# Patient Record
Sex: Male | Born: 1945 | Race: Black or African American | Hispanic: No | Marital: Married | Smoking: Former smoker
Health system: Southern US, Community
[De-identification: ages and names within clinical notes are randomized; demographics above are authoritative.]

## PROBLEM LIST (undated history)

## (undated) DIAGNOSIS — R7303 Prediabetes: Secondary | ICD-10-CM

## (undated) DIAGNOSIS — R351 Nocturia: Secondary | ICD-10-CM

## (undated) DIAGNOSIS — N400 Enlarged prostate without lower urinary tract symptoms: Secondary | ICD-10-CM

## (undated) DIAGNOSIS — I10 Essential (primary) hypertension: Secondary | ICD-10-CM

## (undated) DIAGNOSIS — R339 Retention of urine, unspecified: Secondary | ICD-10-CM

## (undated) DIAGNOSIS — D649 Anemia, unspecified: Secondary | ICD-10-CM

## (undated) DIAGNOSIS — I2510 Atherosclerotic heart disease of native coronary artery without angina pectoris: Secondary | ICD-10-CM

## (undated) DIAGNOSIS — E119 Type 2 diabetes mellitus without complications: Secondary | ICD-10-CM

## (undated) DIAGNOSIS — N189 Chronic kidney disease, unspecified: Secondary | ICD-10-CM

## (undated) DIAGNOSIS — I509 Heart failure, unspecified: Secondary | ICD-10-CM

## (undated) DIAGNOSIS — K449 Diaphragmatic hernia without obstruction or gangrene: Secondary | ICD-10-CM

## (undated) DIAGNOSIS — M7989 Other specified soft tissue disorders: Secondary | ICD-10-CM

## (undated) DIAGNOSIS — Z955 Presence of coronary angioplasty implant and graft: Secondary | ICD-10-CM

## (undated) DIAGNOSIS — R1310 Dysphagia, unspecified: Secondary | ICD-10-CM

## (undated) DIAGNOSIS — E785 Hyperlipidemia, unspecified: Secondary | ICD-10-CM

## (undated) DIAGNOSIS — R0602 Shortness of breath: Secondary | ICD-10-CM

## (undated) DIAGNOSIS — E1142 Type 2 diabetes mellitus with diabetic polyneuropathy: Secondary | ICD-10-CM

## (undated) DIAGNOSIS — J189 Pneumonia, unspecified organism: Secondary | ICD-10-CM

## (undated) DIAGNOSIS — I255 Ischemic cardiomyopathy: Secondary | ICD-10-CM

## (undated) DIAGNOSIS — H04123 Dry eye syndrome of bilateral lacrimal glands: Secondary | ICD-10-CM

## (undated) HISTORY — DX: Type 2 diabetes mellitus without complications: E11.9

## (undated) HISTORY — PX: OTHER SURGICAL HISTORY: SHX169

## (undated) HISTORY — DX: Hyperlipidemia, unspecified: E78.5

## (undated) HISTORY — DX: Type 2 diabetes mellitus with diabetic polyneuropathy: E11.42

## (undated) HISTORY — DX: Other specified soft tissue disorders: M79.89

## (undated) HISTORY — PX: CARDIAC CATHETERIZATION: SHX172

## (undated) HISTORY — PX: UPPER GASTROINTESTINAL ENDOSCOPY: SHX188

## (undated) HISTORY — DX: Ischemic cardiomyopathy: I25.5

## (undated) HISTORY — DX: Presence of coronary angioplasty implant and graft: Z95.5

## (undated) HISTORY — DX: Chronic kidney disease, unspecified: N18.9

---

## 2000-08-18 ENCOUNTER — Encounter: Admission: RE | Admit: 2000-08-18 | Discharge: 2000-08-18 | Payer: Self-pay | Admitting: Internal Medicine

## 2000-08-18 ENCOUNTER — Encounter: Payer: Self-pay | Admitting: Internal Medicine

## 2000-08-19 ENCOUNTER — Ambulatory Visit (HOSPITAL_COMMUNITY): Admission: RE | Admit: 2000-08-19 | Discharge: 2000-08-19 | Payer: Self-pay | Admitting: Internal Medicine

## 2004-01-14 ENCOUNTER — Encounter (INDEPENDENT_AMBULATORY_CARE_PROVIDER_SITE_OTHER): Payer: Self-pay | Admitting: Specialist

## 2004-01-14 ENCOUNTER — Ambulatory Visit (HOSPITAL_BASED_OUTPATIENT_CLINIC_OR_DEPARTMENT_OTHER): Admission: RE | Admit: 2004-01-14 | Discharge: 2004-01-14 | Payer: Self-pay | Admitting: Urology

## 2004-01-14 ENCOUNTER — Ambulatory Visit (HOSPITAL_COMMUNITY): Admission: RE | Admit: 2004-01-14 | Discharge: 2004-01-14 | Payer: Self-pay | Admitting: Urology

## 2006-09-28 ENCOUNTER — Encounter: Admission: RE | Admit: 2006-09-28 | Discharge: 2006-09-28 | Payer: Self-pay | Admitting: Internal Medicine

## 2006-12-23 ENCOUNTER — Ambulatory Visit (HOSPITAL_COMMUNITY): Admission: RE | Admit: 2006-12-23 | Discharge: 2006-12-23 | Payer: Self-pay | Admitting: Gastroenterology

## 2009-11-12 DIAGNOSIS — R0602 Shortness of breath: Secondary | ICD-10-CM | POA: Insufficient documentation

## 2009-11-12 DIAGNOSIS — I10 Essential (primary) hypertension: Secondary | ICD-10-CM | POA: Insufficient documentation

## 2009-11-12 DIAGNOSIS — E1169 Type 2 diabetes mellitus with other specified complication: Secondary | ICD-10-CM

## 2009-11-12 DIAGNOSIS — G35 Multiple sclerosis: Secondary | ICD-10-CM | POA: Insufficient documentation

## 2009-11-12 DIAGNOSIS — E785 Hyperlipidemia, unspecified: Secondary | ICD-10-CM | POA: Insufficient documentation

## 2009-11-12 HISTORY — DX: Essential (primary) hypertension: I10

## 2009-11-12 HISTORY — DX: Shortness of breath: R06.02

## 2009-11-12 HISTORY — DX: Type 2 diabetes mellitus with other specified complication: E11.69

## 2009-11-13 ENCOUNTER — Ambulatory Visit: Payer: Self-pay | Admitting: Pulmonary Disease

## 2009-12-02 ENCOUNTER — Ambulatory Visit: Payer: Self-pay | Admitting: Pulmonary Disease

## 2010-05-27 NOTE — Miscellaneous (Signed)
Summary: Orders Update-pft charges//jwr  Clinical Lists Changes  Orders: Added new Service order of Carbon Monoxide diffusing w/capacity (94720) - Signed Added new Service order of Lung Volumes (94240) - Signed Added new Service order of Spirometry (Pre & Post) (94060) - Signed 

## 2010-05-27 NOTE — Assessment & Plan Note (Signed)
Summary: consult for dyspnea   Copy to:  Willey Blade Primary Provider/Referring Provider:  Willey Blade  CC:  Pulmonary Consult.  History of Present Illness: The pt is a 65y.o. male who I have been asked to see for doe.  The pt states that his family has been commenting on abnormal breathing for a few years, although he feels that he can walk long distances at a moderate pace.  He states that he may have an occasional "bad day" where he gets winded easily.  He has no issues bringing groceries in from the car, and does not have problems going up 1-2 flight of stairs.  He denies any signficant cough or mucus production.  He does have a h/o MS, but denies any significant muscle weakness.  He does have a h/o smoking,  but has not had recent pfts or cxr.  He denies any h/o cardiac disease.  His weight has increased 10-15 pounds over the last 2 years.  Preventive Screening-Counseling & Management  Alcohol-Tobacco     Smoking Status: quit  Current Medications (verified): 1)  Vitamin D 5000 .... Take 1 Tablet By Mouth Two Times A Day 2)  Vitamin D 1000 Unit Tabs (Cholecalciferol) .... Take 1 Tablet By Mouth Two Times A Day 3)  Calcium 500 + Vitamin D .... Take 1 Tablet By Mouth Three Times A Day 4)  Multivitamins  Tabs (Multiple Vitamin) .... Take 1 Tablet By Mouth Once A Day 5)  Fish Oil 1000 Mg Caps (Omega-3 Fatty Acids) .... Take 1 Tablet By Mouth Once A Day 6)  Vitamin C 1000 Mg Tabs (Ascorbic Acid) .... Take 1 Tablet By Mouth Once A Day 7)  Vitamin B-12 2500 Mcg Subl (Cyanocobalamin) .... Take 1 Tablet By Mouth Once A Day 8)  Garlic Oil 123XX123 Mg Caps (Garlic) .... Take 1 Tablet By Mouth Once A Day 9)  Diovan Hct 160-25 Mg Tabs (Valsartan-Hydrochlorothiazide) .... Take 1 Tablet By Mouth Once A Day 10)  Propranolol Hcl 10 Mg Tabs (Propranolol Hcl) .... Take 1 Tablet By Mouth Two Times A Day  Allergies (verified): No Known Drug Allergies  Past History:  Past Medical  History:  HYPERLIPIDEMIA (ICD-272.4) MULTIPLE SCLEROSIS (ICD-340) HYPERTENSION (ICD-401.9)  Past Surgical History: testicular surgery approx 1990s  Family History: Reviewed history and no changes required. allergies: brothers x 3  clotting disorders: mother (stroke) rheumastim: mother cancer: father (prostate) mother: DM father: DM  Social History: Reviewed history and no changes required. Patient states former smoker.  started at age 56.  56 1/2 ppd.  quit 1996 pt is married. pt has children. pt is retired from [employer].   Smoking Status:  quit  Review of Systems       The patient complains of shortness of breath with activity, shortness of breath at rest, tooth/dental problems, headaches, nasal congestion/difficulty breathing through nose, sneezing, itching, and hand/feet swelling.  The patient denies productive cough, non-productive cough, coughing up blood, chest pain, irregular heartbeats, acid heartburn, indigestion, loss of appetite, weight change, abdominal pain, difficulty swallowing, sore throat, ear ache, anxiety, depression, joint stiffness or pain, rash, change in color of mucus, and fever.    Vital Signs:  Patient profile:   65 year old male Height:      69 inches Weight:      194.13 pounds BMI:     28.77 O2 Sat:      95 % on Room air Temp:     98.0 degrees F oral Pulse rate:  91 / minute BP sitting:   152 / 92  (left arm) Cuff size:   regular  Vitals Entered By: Matthew Folks LPN (July 20, 624THL 075-GRM AM)  O2 Flow:  Room air CC: Pulmonary Consult Comments Medications reviewed with patient Matthew Folks LPN  July 20, 624THL 075-GRM AM    Physical Exam  General:  wd male in nad Eyes:  PERRLA and EOMI.   Nose:  mild edema and erythema, mild narrowing but patent. Mouth:  clear  Neck:  no jvd, tmg, LN Lungs:  totally clear to auscultation, no wheezing or rhonchi Heart:  rrr, 2/6 sem Abdomen:  soft and nontender, bs+ Extremities:  mild LLE  edema, right with no edema pulses intact distally, no cyanosis Neurologic:  alert and oriented, moves all 4.   Impression & Recommendations:  Problem # 1:  SHORTNESS OF BREATH (ICD-786.05) the pt has doe primarily with moderate to heavier exertional activities, and intermittant in nature.  He has a long h/o smoking in the past, and may have some degree of obstructive lung disease.  He also has gained weight over the last 2 years, and does not get consistent exercise.  Would also consider whether he may have issues with thyroid disease, anemia, or cardiac disease.  Will check cxr today, and schedule for full pfts with f/u with me on same day to discuss.  Medications Added to Medication List This Visit: 1)  Vitamin D 5000  .... Take 1 tablet by mouth two times a day 2)  Vitamin D 1000 Unit Tabs (Cholecalciferol) .... Take 1 tablet by mouth two times a day 3)  Calcium 500 + Vitamin D  .... Take 1 tablet by mouth three times a day 4)  Multivitamins Tabs (Multiple vitamin) .... Take 1 tablet by mouth once a day 5)  Fish Oil 1000 Mg Caps (Omega-3 fatty acids) .... Take 1 tablet by mouth once a day 6)  Vitamin C 1000 Mg Tabs (Ascorbic acid) .... Take 1 tablet by mouth once a day 7)  Vitamin B-12 2500 Mcg Subl (Cyanocobalamin) .... Take 1 tablet by mouth once a day 8)  Garlic Oil 123XX123 Mg Caps (Garlic) .... Take 1 tablet by mouth once a day 9)  Diovan Hct 160-25 Mg Tabs (Valsartan-hydrochlorothiazide) .... Take 1 tablet by mouth once a day 10)  Propranolol Hcl 10 Mg Tabs (Propranolol hcl) .... Take 1 tablet by mouth two times a day  Other Orders: Consultation Level IV [id number]) Pulmonary Referral (Pulmonary) T-2 View CXR ([id number])  Patient Instructions: 1)  will check cxr today, and will call with results. 2)  will set up for breathing studies, and see you back on same day to review. 3)  work on weight loss and conditioning program.

## 2010-05-27 NOTE — Assessment & Plan Note (Signed)
Summary: rov for dyspnea, discuss pft results.   Copy to:  Willey Blade Primary Provider/Referring Provider:  Willey Blade  CC:  Pt is here for a f/u appt to discuss PFT results.  Pt still c/o sob with exertion and at rest. .  History of Present Illness: The pt comes in today for f/u after his pfts for dyspnea.  He was found to have normal spirometry, and only very mild reduction in TLC and DLCO that is most c/w his centripetal obesity (his cxr shows no ISLD).  I have reviewed the study with him in great detail and answered all of his questions.  Medications Prior to Update: 1)  Vitamin D 5000 .... Take 1 Tablet By Mouth Two Times A Day 2)  Vitamin D 1000 Unit Tabs (Cholecalciferol) .... Take 1 Tablet By Mouth Two Times A Day 3)  Calcium 500 + Vitamin D .... Take 1 Tablet By Mouth Three Times A Day 4)  Multivitamins  Tabs (Multiple Vitamin) .... Take 1 Tablet By Mouth Once A Day 5)  Fish Oil 1000 Mg Caps (Omega-3 Fatty Acids) .... Take 1 Tablet By Mouth Once A Day 6)  Vitamin C 1000 Mg Tabs (Ascorbic Acid) .... Take 1 Tablet By Mouth Once A Day 7)  Vitamin B-12 2500 Mcg Subl (Cyanocobalamin) .... Take 1 Tablet By Mouth Once A Day 8)  Garlic Oil 123XX123 Mg Caps (Garlic) .... Take 1 Tablet By Mouth Once A Day 9)  Diovan Hct 160-25 Mg Tabs (Valsartan-Hydrochlorothiazide) .... Take 1 Tablet By Mouth Once A Day 10)  Propranolol Hcl 10 Mg Tabs (Propranolol Hcl) .... Take 1 Tablet By Mouth Two Times A Day  Allergies (verified): No Known Drug Allergies  Review of Systems       The patient complains of shortness of breath with activity, shortness of breath at rest, tooth/dental problems, nasal congestion/difficulty breathing through nose, itching, and hand/feet swelling.  The patient denies productive cough, non-productive cough, coughing up blood, chest pain, irregular heartbeats, acid heartburn, indigestion, loss of appetite, weight change, abdominal pain, difficulty swallowing, sore  throat, headaches, sneezing, ear ache, anxiety, depression, joint stiffness or pain, rash, change in color of mucus, and fever.    Vital Signs:  Patient profile:   65 year old male Height:      69 inches Weight:      195 pounds BMI:     28.90 O2 Sat:      93 % on Room air Temp:     97.5 degrees F oral Pulse rate:   93 / minute BP sitting:   156 / 92  (right arm) Cuff size:   regular  Vitals Entered By: Matthew Folks LPN (August  8, 624THL 2:23 PM)  O2 Flow:  Room air CC: Pt is here for a f/u appt to discuss PFT results.  Pt still c/o sob with exertion and at rest.  Comments Medications reviewed with patient Matthew Folks LPN  August  8, 624THL 2:26 PM    Physical Exam  General:  ow male in nad Lungs:  clear Extremities:  mild LLE edema, no cyanosis Neurologic:  alert and oriented, moves all 4.   Impression & Recommendations:  Problem # 1:  SHORTNESS OF BREATH (ICD-786.05)  the pt has a clear cxr, no obstructive disease on spirometry, and only a very mild decrease in TLC and DLCO.  He has moderate centripetal obesity that I suspect is contributing to this, and no evidence for ISLD on his cxr.  At this point, I see no obvious pulmonary etiology for his moderate doe.  I have recommended to him that he work on weight loss and some type of conditioning program.  Will also send a note to his primary md to consider cardiac evaluation for other causes of doe.     Other Orders: Est. Patient Level III [id number])  Patient Instructions: 1)  work on weight loss and exercise program. 2)  followup with me as needed.

## 2010-09-12 NOTE — Op Note (Signed)
NAME:  MIQUEAS, RACKERS                         ACCOUNT NO.:  [id number]   MEDICAL RECORD NO.:  [id number]                   PATIENT TYPE:  AMB   LOCATION:  [facility]                                 FACILITY:  [facility]   PHYSICIAN:  Sigmund I. Gaynelle Arabian, M.D.         DATE OF BIRTH:  04/04/1946   DATE OF PROCEDURE:  01/14/2004  DATE OF DISCHARGE:                                 OPERATIVE REPORT   PREOPERATIVE DIAGNOSIS:  Gross hematuria with positive NNP-22.   POSTOPERATIVE DIAGNOSIS:  Gross hematuria with positive NNP-22.   OPERATION:  Cystourethroscopy, random bladder biopsies with cauterization of  the biopsy sites.   SURGEON:  Sigmund I. Gaynelle Arabian, M.D.   ANESTHESIA:  General LMA.   PREPARATION:  After appropriate pre-anesthesia, the patient was brought to  the operating room and placed on the operating table in the dorsal supine  position where general LMA anesthesia was introduced.  He was then replaced  in the dorsal lithotomy position where the pubis was prepped with Betadine  solution and draped in the usual fashion.   PROCEDURE:  Cystourethroscopy was accomplished and showed an angry, reddened  trigone.  Two biopsies were taken from the trigone area and biopsy sites  were cauterized.  No other abnormalities were identified in the bladder.  The bladder was drained of fluid and the patient was awakened and taken to  the recovery room in good condition.      SIT/MEDQ  D:  01/14/2004  T:  01/14/2004  Job:  [id number]

## 2012-02-26 DIAGNOSIS — N138 Other obstructive and reflux uropathy: Secondary | ICD-10-CM | POA: Diagnosis not present

## 2012-02-26 DIAGNOSIS — N401 Enlarged prostate with lower urinary tract symptoms: Secondary | ICD-10-CM | POA: Diagnosis not present

## 2012-03-02 ENCOUNTER — Other Ambulatory Visit: Payer: Self-pay | Admitting: Urology

## 2012-03-03 ENCOUNTER — Other Ambulatory Visit: Payer: Self-pay | Admitting: Urology

## 2012-03-28 ENCOUNTER — Encounter (HOSPITAL_BASED_OUTPATIENT_CLINIC_OR_DEPARTMENT_OTHER): Payer: Self-pay | Admitting: *Deleted

## 2012-03-28 NOTE — Progress Notes (Signed)
NPO AFTER MN. ARRIVES AT 0945. NEEDS ISTAT AND EKG. WILL TAKE PROPRANOLOL AM OF SURG W/ SIP OF WATER.

## 2012-04-04 ENCOUNTER — Encounter (HOSPITAL_BASED_OUTPATIENT_CLINIC_OR_DEPARTMENT_OTHER): Payer: Self-pay | Admitting: *Deleted

## 2012-04-04 ENCOUNTER — Encounter (HOSPITAL_BASED_OUTPATIENT_CLINIC_OR_DEPARTMENT_OTHER)
Admission: RE | Disposition: A | Discharge status: Home / Self Care | Payer: Self-pay | Source: Ambulatory Visit | Attending: Urology

## 2012-04-04 ENCOUNTER — Encounter (HOSPITAL_BASED_OUTPATIENT_CLINIC_OR_DEPARTMENT_OTHER): Payer: Self-pay | Admitting: Anesthesiology

## 2012-04-04 ENCOUNTER — Other Ambulatory Visit: Payer: Self-pay

## 2012-04-04 ENCOUNTER — Ambulatory Visit (HOSPITAL_BASED_OUTPATIENT_CLINIC_OR_DEPARTMENT_OTHER): Payer: Medicare Other | Admitting: Anesthesiology

## 2012-04-04 ENCOUNTER — Ambulatory Visit (HOSPITAL_BASED_OUTPATIENT_CLINIC_OR_DEPARTMENT_OTHER)
Admission: RE | Admit: 2012-04-04 | Discharge: 2012-04-04 | Disposition: A | Discharge status: Home / Self Care | Payer: Medicare Other | Source: Ambulatory Visit | Attending: Urology | Admitting: Urology

## 2012-04-04 DIAGNOSIS — I10 Essential (primary) hypertension: Secondary | ICD-10-CM | POA: Insufficient documentation

## 2012-04-04 DIAGNOSIS — E559 Vitamin D deficiency, unspecified: Secondary | ICD-10-CM | POA: Diagnosis not present

## 2012-04-04 DIAGNOSIS — Z79899 Other long term (current) drug therapy: Secondary | ICD-10-CM | POA: Insufficient documentation

## 2012-04-04 DIAGNOSIS — K219 Gastro-esophageal reflux disease without esophagitis: Secondary | ICD-10-CM | POA: Insufficient documentation

## 2012-04-04 DIAGNOSIS — N4289 Other specified disorders of prostate: Secondary | ICD-10-CM | POA: Diagnosis not present

## 2012-04-04 DIAGNOSIS — N401 Enlarged prostate with lower urinary tract symptoms: Secondary | ICD-10-CM | POA: Diagnosis not present

## 2012-04-04 DIAGNOSIS — Z7982 Long term (current) use of aspirin: Secondary | ICD-10-CM | POA: Diagnosis not present

## 2012-04-04 DIAGNOSIS — N400 Enlarged prostate without lower urinary tract symptoms: Secondary | ICD-10-CM | POA: Diagnosis not present

## 2012-04-04 DIAGNOSIS — Z1503 Genetic susceptibility to malignant neoplasm of prostate: Secondary | ICD-10-CM | POA: Diagnosis not present

## 2012-04-04 DIAGNOSIS — N138 Other obstructive and reflux uropathy: Secondary | ICD-10-CM | POA: Insufficient documentation

## 2012-04-04 HISTORY — DX: Benign prostatic hyperplasia without lower urinary tract symptoms: N40.0

## 2012-04-04 HISTORY — DX: Dysphagia, unspecified: R13.10

## 2012-04-04 HISTORY — DX: Diaphragmatic hernia without obstruction or gangrene: K44.9

## 2012-04-04 HISTORY — DX: Dry eye syndrome of bilateral lacrimal glands: H04.123

## 2012-04-04 HISTORY — DX: Nocturia: R35.1

## 2012-04-04 HISTORY — PX: TRANSURETHRAL RESECTION OF PROSTATE: SHX73

## 2012-04-04 HISTORY — DX: Shortness of breath: R06.02

## 2012-04-04 HISTORY — DX: Retention of urine, unspecified: R33.9

## 2012-04-04 HISTORY — DX: Essential (primary) hypertension: I10

## 2012-04-04 LAB — POCT I-STAT 4, (NA,K, GLUC, HGB,HCT)
Glucose, Bld: 93 mg/dL (ref 70–99)
HCT: 42 % (ref 39.0–52.0)
Hemoglobin: 14.3 g/dL (ref 13.0–17.0)
Potassium: 3.9 mEq/L (ref 3.5–5.1)
Sodium: 142 mEq/L (ref 135–145)

## 2012-04-04 SURGERY — TRANSURETHRAL RESECTION OF THE PROSTATE WITH GYRUS INSTRUMENTS
Anesthesia: General | Site: Prostate | Wound class: Clean Contaminated

## 2012-04-04 MED ORDER — FENTANYL CITRATE 0.05 MG/ML IJ SOLN
25.0000 ug | INTRAMUSCULAR | Status: DC | PRN
  Filled 2012-04-04: qty 1

## 2012-04-04 MED ORDER — LIDOCAINE HCL (CARDIAC) 20 MG/ML IV SOLN
INTRAVENOUS | Status: DC | PRN
  Administered 2012-04-04: 80 mg via INTRAVENOUS

## 2012-04-04 MED ORDER — ONDANSETRON HCL 4 MG/2ML IJ SOLN
INTRAMUSCULAR | Status: DC | PRN
  Administered 2012-04-04: 4 mg via INTRAVENOUS

## 2012-04-04 MED ORDER — DEXAMETHASONE SODIUM PHOSPHATE 4 MG/ML IJ SOLN
INTRAMUSCULAR | Status: DC | PRN
  Administered 2012-04-04: 10 mg via INTRAVENOUS

## 2012-04-04 MED ORDER — BELLADONNA ALKALOIDS-OPIUM 16.2-60 MG RE SUPP
RECTAL | Status: DC | PRN
  Administered 2012-04-04: 1 via RECTAL

## 2012-04-04 MED ORDER — FENTANYL CITRATE 0.05 MG/ML IJ SOLN
INTRAMUSCULAR | Status: DC | PRN
  Administered 2012-04-04 (×2): 50 ug via INTRAVENOUS
  Administered 2012-04-04: 25 ug via INTRAVENOUS
  Administered 2012-04-04: 50 ug via INTRAVENOUS
  Administered 2012-04-04: 25 ug via INTRAVENOUS

## 2012-04-04 MED ORDER — SULFAMETHOXAZOLE-TMP DS 800-160 MG PO TABS: 1.0000 | ORAL_TABLET | Freq: Two times a day (BID) | ORAL | Status: DC

## 2012-04-04 MED ORDER — KETOROLAC TROMETHAMINE 30 MG/ML IJ SOLN
INTRAMUSCULAR | Status: DC | PRN
  Administered 2012-04-04: 30 mg via INTRAVENOUS

## 2012-04-04 MED ORDER — PHENAZOPYRIDINE HCL 200 MG PO TABS: 200.0000 mg | ORAL_TABLET | Freq: Three times a day (TID) | ORAL | Status: DC | PRN

## 2012-04-04 MED ORDER — CIPROFLOXACIN HCL 500 MG PO TABS: 500.0000 mg | ORAL_TABLET | Freq: Two times a day (BID) | ORAL | Status: DC

## 2012-04-04 MED ORDER — LACTATED RINGERS IV SOLN
INTRAVENOUS | Status: DC
  Administered 2012-04-04 (×3): via INTRAVENOUS
  Filled 2012-04-04: qty 1000

## 2012-04-04 MED ORDER — ACETAMINOPHEN 10 MG/ML IV SOLN
INTRAVENOUS | Status: DC | PRN
  Administered 2012-04-04: 1000 mg via INTRAVENOUS

## 2012-04-04 MED ORDER — SODIUM CHLORIDE 0.9 % IR SOLN
Status: DC | PRN
  Administered 2012-04-04: 9000 mL via INTRAVESICAL

## 2012-04-04 MED ORDER — LACTATED RINGERS IV SOLN
INTRAVENOUS | Status: DC
  Filled 2012-04-04: qty 1000

## 2012-04-04 MED ORDER — CEFAZOLIN SODIUM-DEXTROSE 2-3 GM-% IV SOLR
2.0000 g | INTRAVENOUS | Status: AC
  Administered 2012-04-04: 2 g via INTRAVENOUS
  Filled 2012-04-04: qty 50

## 2012-04-04 MED ORDER — PROMETHAZINE HCL 25 MG/ML IJ SOLN
6.2500 mg | INTRAMUSCULAR | Status: DC | PRN
  Filled 2012-04-04: qty 1

## 2012-04-04 MED ORDER — PROPOFOL 10 MG/ML IV BOLUS
INTRAVENOUS | Status: DC | PRN
  Administered 2012-04-04: 250 mg via INTRAVENOUS

## 2012-04-04 MED ORDER — MEPERIDINE HCL 25 MG/ML IJ SOLN
6.2500 mg | INTRAMUSCULAR | Status: DC | PRN
  Filled 2012-04-04: qty 1

## 2012-04-04 MED ORDER — OXYCODONE-ACETAMINOPHEN 5-325 MG PO TABS: 1.0000 | ORAL_TABLET | ORAL | Status: DC | PRN

## 2012-04-04 MED ORDER — 0.9 % SODIUM CHLORIDE (POUR BTL) OPTIME
TOPICAL | Status: DC | PRN
  Administered 2012-04-04: 1000 mL

## 2012-04-04 MED ORDER — CEFAZOLIN SODIUM 1-5 GM-% IV SOLN
1.0000 g | INTRAVENOUS | Status: DC
  Filled 2012-04-04: qty 50

## 2012-04-04 SURGICAL SUPPLY — 38 items
BAG DRAIN URO-CYSTO SKYTR STRL (DRAIN) ×2 IMPLANT
BAG URINE DRAINAGE (UROLOGICAL SUPPLIES) ×2 IMPLANT
BAG URINE LEG 19OZ MD ST LTX (BAG) IMPLANT
BLADE SURG 15 STRL LF DISP TIS (BLADE) IMPLANT
BLADE SURG 15 STRL SS (BLADE)
BOOTIES KNEE HIGH SLOAN (MISCELLANEOUS) IMPLANT
CANISTER SUCT LVC 12 LTR MEDI- (MISCELLANEOUS) ×4 IMPLANT
CATH AINSWORTH 30CC 24FR (CATHETERS) IMPLANT
CATH FOLEY 2WAY SLVR  5CC 14FR (CATHETERS)
CATH FOLEY 2WAY SLVR  5CC 20FR (CATHETERS)
CATH FOLEY 2WAY SLVR 5CC 14FR (CATHETERS) IMPLANT
CATH FOLEY 2WAY SLVR 5CC 20FR (CATHETERS) IMPLANT
CATH HEMA 3WAY 30CC 24FR COUDE (CATHETERS) IMPLANT
CATH HEMA 3WAY 30CC 24FR RND (CATHETERS) ×2 IMPLANT
CATH SIMPLASTIC 24 30ML (CATHETERS) IMPLANT
CLOTH BEACON ORANGE TIMEOUT ST (SAFETY) ×2 IMPLANT
DRAPE CAMERA CLOSED 9X96 (DRAPES) ×2 IMPLANT
ELECT BUTTON HF 24-28F 2 30DE (ELECTRODE) IMPLANT
ELECT LOOP HF 24-28F (CUTTING LOOP) IMPLANT
ELECT LOOP HF 26F 30D .35MM (CUTTING LOOP) IMPLANT
ELECT LOOP MED HF 24F 12D CBL (CLIP) ×2 IMPLANT
ELECT NEEDLE 45D HF 24-28F 12D (CUTTING LOOP) IMPLANT
ELECT REM PT RETURN 9FT ADLT (ELECTROSURGICAL)
ELECTRODE REM PT RTRN 9FT ADLT (ELECTROSURGICAL) IMPLANT
GLOVE BIO SURGEON STRL SZ7.5 (GLOVE) ×2 IMPLANT
GLOVE INDICATOR 6.5 STRL GRN (GLOVE) ×4 IMPLANT
GOWN PREVENTION PLUS LG XLONG (DISPOSABLE) ×2 IMPLANT
GOWN STRL REIN XL XLG (GOWN DISPOSABLE) ×2 IMPLANT
HOLDER FOLEY CATH W/STRAP (MISCELLANEOUS) IMPLANT
KIT ASPIRATION TUBING (SET/KITS/TRAYS/PACK) ×2 IMPLANT
LOOP CUTTING 24FR OLYMPUS (CUTTING LOOP) IMPLANT
PACK CYSTOSCOPY (CUSTOM PROCEDURE TRAY) ×2 IMPLANT
PLUG CATH AND CAP STER (CATHETERS) ×2 IMPLANT
SET ASPIRATION TUBING (TUBING) IMPLANT
SUT ETHILON 3 0 PS 1 (SUTURE) IMPLANT
SUT SILK 0 TIES 10X30 (SUTURE) IMPLANT
SYR 30ML LL (SYRINGE) IMPLANT
SYRINGE IRR TOOMEY STRL 70CC (SYRINGE) ×2 IMPLANT

## 2012-04-04 NOTE — Anesthesia Postprocedure Evaluation (Signed)
  Anesthesia Post-op Note  Patient: Christian [name]  Procedure(s) Performed: Procedure(s) (LRB): TRANSURETHRAL RESECTION OF THE PROSTATE WITH GYRUS INSTRUMENTS (N/A)  Patient Location: PACU  Anesthesia Type: General  Level of Consciousness: awake and alert   Airway and Oxygen Therapy: Patient Spontanous Breathing  Post-op Pain: mild  Post-op Assessment: Post-op Vital signs reviewed, Patient's Cardiovascular Status Stable, Respiratory Function Stable, Patent Airway and No signs of Nausea or vomiting  Last Vitals:  Filed Vitals:   04/04/12 1257  BP: 151/91  Pulse: 84  Temp:   Resp: 19    Post-op Vital Signs: stable   Complications: No apparent anesthesia complications

## 2012-04-04 NOTE — Op Note (Signed)
Pre-operative diagnosis : BPH Postoperative diagnosis:Same  Operation: TUR P.  Surgeon:  S. Gaynelle Arabian, MD  First assistant: None  Anesthesia:  general  Preparation: After appropriate preanesthesia, the patient was brought to the operating room, placed on the operating table in the dorsal supine position where general LMA anesthesia was introduced. He was replaced in the dorsal lithotomy position where the pubis was prepped with Betadine solution and draped in usual fashion. The arm band was double checked  Review history:  66 y/o AA male returns today for cystoscopy & Prostate u/s for possible CTT candidate. Rapaflo (costly). Hx of BPH with BOO and microhematuria presents for continued evaluation of LUTS. At his annual visit 06/2011, he reported nocturia x 1, weak, intermittant flow and occasional urinary urgency. Note his past history of microhematuria, with negative workup, and a remote history of testicular surgery for benign causes. Father has CaP.IPS=15. Flow rate max=8cc/sec.  He was started on Flomax 0.4mg  at last visit 06/2011. He has been taking this as prescribed but has not appreciated any benefit. He has tolerated it well. He denies gross hematuria, dysuria, flank pain, fever or chills. He continues with weak stream, intermittency, urgency and nocturia despite Flomax or Rapaflo.  07/06/11 PSA - 0.91 prostate size 32cc. Cystoscopy Trilobar BPH with elevated bladder neck. Options discussed with patient and wife,and he has selected [facility], as outpatient.      Statement of  Likelihood of Success: Excellent. TIME-OUT observed.:  Procedure: Cystourethroscopy was accomplished, and showed the patient had normal pendulous urethra, and normal Vero. He had trilobar BPH with elevated bladder neck. Trabeculation was noted within the bladder, but there was no evidence of bladder stone, tumor, or diverticular formation. There were no cellules. Photodocumentation of BPH was noted.  Attention  was directed toward the elevated bladder neck, and this was resected from the 7:00 to the 5:00 position. The enlarged lateral lobes were then resected from the 10:00 to the 6:00 position, and then from the 1:00 to the 5:00 positions. Chips were evacuated free from the bladder with the piston syringe. The resection site was electrocoagulated using the gyrus instruments. Minimal bleeding was noted. A 24 French hematuria catheter was placed, with a plug in the continuous flow port. Traction device was placed in the leg the case as needed. Irrigation was accomplished, and following irrigation of the prostatic chips the bladder, the bladder irrigated clear fluid.  The patient was awakened and taken to recovery room in good condition. He was given IV Toradol at the end of the case.

## 2012-04-04 NOTE — H&P (Signed)
cc: Dr. Heath Gold   Reason For Visit  Cystoscopy & Prostate u/s   Active Problems Problems  1. 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 Vitamin D Deficiency 268.9  History of Present Illness         66 y/o AA [name] returns today for cystoscopy & Prostate u/s for possible CTT candidate.  Rapaflo (costly).  Hx of BPH with BOO and microhematuria presents for continued evaluation of LUTS.  At his annual visit 06/2011, he reported nocturia x 1, weak, intermittant flow and occasional urinary urgency.  Note his past history of microhematuria, with negative workup, and a remote history of testicular surgery for benign causes. Father has CaP.IPS=15. Flow rate max=8cc/sec.     He was started on Flomax 0.4mg  at last visit 06/2011.  He has been taking this as prescribed but has not appreciated any benefit.  He has tolerated it well. He denies gross hematuria, dysuria, flank pain, fever or chills.  He continues with weak stream, intermittency, urgency and nocturia despite Flomax or Rapaflo.   07/06/11  PSA - 0.91   Past Medical History Problems  1. History of  Esophageal Reflux 530.81 2. History of  Esophageal Reflux 530.81 3. History of  Heartburn 787.1 4. History of  Hypertension 401.9  Surgical History Problems  1. History of  Surgery Testis  Current Meds 1. Advil TABS; Therapy: (Recorded:04Dec2007) to 2. Aspirin 325 MG Oral Tablet; Therapy: (Recorded:04Dec2007) to 3. Cay[place] CAPS; Therapy: (Recorded:27Mar2012) to 4. Cod Liver OIL; Therapy: (Recorded:09Dec2008) to 5. Diovan HCT 160-25 MG Oral Tablet; Therapy: [id number] to 6. Garlic CAPS; Therapy: (Recorded:09Dec2008) to 7. Ketorolac Tromethamine 0.4 % Ophthalmic Solution; Therapy: 09Apr2013 to 8. Multi-Vitamin Oral Tablet; Therapy: (Recorded:04Dec2007) to 9. Propranolol HCl 10 MG Oral Tablet; Therapy: (Recorded:04Dec2007) to 10. Rapaflo 8 MG Oral  Capsule; TAKE 1 CAPSULE DAILY WITH FOOD; Therapy: [phone] to   (Evaluate:19Jun2014); Last [id number] 11. Vitamin B-12 1000 MCG/ML SOLN; Therapy: (Recorded:09Dec2008) to 12. Vitamin C w/Rose Hips 1000 MG TABS; Therapy: (Recorded:09Dec2008) to 13. Vitamin D TABS; Therapy: (Recorded:04Dec2007) to 14. Vitamin E 400 UNIT Oral Capsule; Therapy: (Recorded:09Dec2008) to 15. Welchol 3.75 GM Oral Packet; Therapy: [id number] to  Allergies Medication  1. No Known Drug Allergies  Family History Problems  1. Maternal history of  Diabetes Mellitus V18.0 2. Paternal history of  Diabetes Mellitus V18.0 3. Family history of  Family Health Status - Father's Age 45 4. Family history of  Family Health Status Number Of Children 2 sons 56. Paternal history of  Hypertension V17.49 6. Maternal history of  Hypertension V17.49 7. Family history of  Mother Deceased At Age ____ 69 8. Paternal history of  Prostate Cancer V16.42  Social History Problems  1. Caffeine Use 5-6/day 2. Family history of  Death In The Family Mother deceased age 7 3. Occupation: Librarian, academic 4. History of  Tobacco Use V15.82 Quit; smoked for 42yrs 1.5ppd Denied  5. History of  Alcohol Use  Vitals Vital Signs [Data Includes: Last 1 Day]  [id number] 03:31PM  Blood Pressure: 136 / 87 Temperature: 97.6 F Heart Rate: 89  Results/Data Urine [Data Includes: Last 1 Day]   [id number]  COLOR YELLOW   APPEARANCE CLEAR   SPECIFIC GRAVITY 1.020   pH 6.0   GLUCOSE NEG mg/dL  BILIRUBIN NEG   KETONE NEG mg/dL  BLOOD TRACE   PROTEIN NEG mg/dL  UROBILINOGEN 0.2 mg/dL  NITRITE NEG   LEUKOCYTE ESTERASE NEG  SQUAMOUS EPITHELIAL/HPF RARE   WBC NONE SEEN WBC/hpf  RBC 0-2 RBC/hpf  BACTERIA NONE SEEN   CRYSTALS NONE SEEN   CASTS NONE SEEN    Procedure  Procedure: Cystoscopy  Chaperone Present: kim.  Indication: Lower Urinary Tract Symptoms.  Informed Consent: Risks, benefits, and potential adverse events were discussed and  informed consent was obtained from the patient.  Prep: The patient was prepped with betadine.  Anesthesia:. Local anesthesia was administered intraurethrally with 2% lidocaine jelly.  Antibiotic prophylaxis: Ciprofloxacin.  Procedure Note:  Urethral meatus:. No abnormalities.  Anterior urethra: No abnormalities.  Prostatic urethra: No abnormalities . The lateral prostatic lobes were enlarged. An enlarged intravesical median lobe was visualized.  Bladder: Visulization was clear. The ureteral orifices were in the normal anatomic position bilaterally. A systematic survey of the bladder demonstrated no bladder tumors or stones. Examination of the bladder demonstrated no clot within the bladder, no trabeculation and no diverticulum no erythematous mucosa, no ulcer, no edema and no cellules. The patient tolerated the procedure well.  Complications: None.    Assessment Assessed  1. Benign Prostatic Hypertrophy With Urinary Obstruction 600.01   BPH with median lobe. Pt needs surgical relief of obstruction. Have discussed office TUNA, vs laser ( Green Light vs Holmium), vs [facility].   Plan Health Maintenance (V70.0)  1. UA With REFLEX  Done: [id number] 02:16PM   Discussed options with pt and his wife. The will consider his options and let me know Monday what he would like to do.   Amendment  prostatic volume 32cc  Amended By: Carolan Clines; 02/26/2012 5:33 PMEST   Signatures Electronically signed by : Carolan Clines, M.D.; Feb 26 2012  5:34PM

## 2012-04-04 NOTE — Transfer of Care (Signed)
Immediate Anesthesia Transfer of Care Note  Patient: Christian [name]  Procedure(s) Performed: Procedure(s) (LRB): TRANSURETHRAL RESECTION OF THE PROSTATE WITH GYRUS INSTRUMENTS (N/A)  Patient Location: Patient transported to PACU with oxygen via face mask at 4 Liters / Min  Anesthesia Type: General  Level of Consciousness: awake and alert   Airway & Oxygen Therapy: Patient Spontanous Breathing and Patient connected to face mask oxygen  Post-op Assessment: Report given to PACU RN and Post -op Vital signs reviewed and stable  Post vital signs: Reviewed and stable  Dentition: Teeth and oropharynx remain in pre-op condition  Complications: No apparent anesthesia complications

## 2012-04-04 NOTE — Anesthesia Procedure Notes (Signed)
Procedure Name: LMA Insertion Date/Time: 04/04/2012 11:18 AM Performed by: Christiana Fuchs Pre-anesthesia Checklist: Patient identified, Emergency Drugs available, Suction available and Patient being monitored Patient Re-evaluated:Patient Re-evaluated prior to inductionOxygen Delivery Method: Circle System Utilized Preoxygenation: Pre-oxygenation with 100% oxygen Intubation Type: IV induction Ventilation: Mask ventilation without difficulty LMA: LMA inserted LMA Size: 4.0 Number of attempts: 1 Airway Equipment and Method: bite block Placement Confirmation: positive ETCO2 Tube secured with: Tape Dental Injury: Teeth and Oropharynx as per pre-operative assessment

## 2012-04-04 NOTE — Interval H&P Note (Signed)
History and Physical Interval Note:  04/04/2012 11:06 AM  Christian [name]  has presented today for surgery, with the diagnosis of Benign Prostatic Hypertrophy  The various methods of treatment have been discussed with the patient and family. After consideration of risks, benefits and other options for treatment, the patient has consented to  Procedure(s) (LRB) with comments: TRANSURETHRAL RESECTION OF THE PROSTATE WITH GYRUS INSTRUMENTS (N/A) as a surgical intervention .  The patient's history has been reviewed, patient examined, no change in status, stable for surgery.  I have reviewed the patient's chart and labs.  Questions were answered to the patient's satisfaction.     Carolan Clines I

## 2012-04-04 NOTE — Anesthesia Preprocedure Evaluation (Addendum)
Anesthesia Evaluation  Patient identified by MRN, date of birth, ID band Patient awake    Reviewed: Allergy & Precautions, H&P , NPO status , Patient's Chart, lab work & pertinent test results  Airway Mallampati: II TM Distance: >3 FB Neck ROM: Full    Dental No notable dental hx.    Pulmonary neg pulmonary ROS,  breath sounds clear to auscultation  Pulmonary exam normal       Cardiovascular hypertension, negative cardio ROS  Rhythm:Regular Rate:Normal     Neuro/Psych negative neurological ROS  negative psych ROS   GI/Hepatic negative GI ROS, Neg liver ROS, hiatal hernia,   Endo/Other  negative endocrine ROS  Renal/GU negative Renal ROS  negative genitourinary   Musculoskeletal negative musculoskeletal ROS (+)   Abdominal   Peds negative pediatric ROS (+)  Hematology negative hematology ROS (+)   Anesthesia Other Findings   Reproductive/Obstetrics negative OB ROS                           Anesthesia Physical Anesthesia Plan  ASA: II  Anesthesia Plan: General   Post-op Pain Management:    Induction: Intravenous  Airway Management Planned: LMA  Additional Equipment:   Intra-op Plan:   Post-operative Plan: Extubation in OR  Informed Consent: I have reviewed the patients History and Physical, chart, labs and discussed the procedure including the risks, benefits and alternatives for the proposed anesthesia with the patient or authorized representative who has indicated his/her understanding and acceptance.   Dental advisory given  Plan Discussed with: CRNA  Anesthesia Plan Comments:         Anesthesia Quick Evaluation

## 2012-04-05 ENCOUNTER — Encounter (HOSPITAL_BASED_OUTPATIENT_CLINIC_OR_DEPARTMENT_OTHER): Payer: Self-pay | Admitting: Urology

## 2012-04-05 DIAGNOSIS — R310 Gross hematuria: Secondary | ICD-10-CM | POA: Diagnosis not present

## 2012-04-05 DIAGNOSIS — N138 Other obstructive and reflux uropathy: Secondary | ICD-10-CM | POA: Diagnosis not present

## 2012-04-05 DIAGNOSIS — N401 Enlarged prostate with lower urinary tract symptoms: Secondary | ICD-10-CM | POA: Diagnosis not present

## 2012-04-05 MED ORDER — CIPROFLOXACIN HCL 500 MG PO TABS: 500.0000 mg | ORAL_TABLET | Freq: Two times a day (BID) | ORAL | Status: DC

## 2012-04-05 MED ORDER — OXYCODONE-ACETAMINOPHEN 5-325 MG PO TABS: 1.0000 | ORAL_TABLET | Freq: Four times a day (QID) | ORAL | Status: DC | PRN

## 2012-04-05 NOTE — Progress Notes (Signed)
Urology Progress Note  1 Day Post-Op   Subjective: Pt doing well. Minimal pain.     No acute urologic events overnight. Ambulation:   positive Flatus:    positive Bowel movement  negative  Pain: complete resolution  Objective:  Blood pressure 173/100, pulse 88, temperature 96.9 F (36.1 C), temperature source Oral, resp. rate 18, height 5\' 9"  (1.753 m), weight 94.121 kg (207 lb 8 oz), SpO2 95.00%.  Physical Exam:  General:  No acute distress, awake Extremities: extremities normal, atraumatic, no cyanosis or edema Genitourinary:  Normal BUS Foley: out       Recent Labs  Basename 04/04/12 1043   HGB 14.3   WBC --   PLT --    Recent Labs  Basename 04/04/12 1043   NA 142   K 3.9   CL --   CO2 --   BUN --   CREATININE --   CALCIUM --   GFRNONAA --   GFRAA --     No results found for this basename: PT:2,INR:2,APTT:2 in the last 72 hours   No components found with this basename: ABG:2  Assessment/Plan: Pt has voided sevweral times it progressively larger vboids. Ready for D/c.   Follow up in 2 weeks.

## 2012-04-08 DIAGNOSIS — R310 Gross hematuria: Secondary | ICD-10-CM | POA: Diagnosis not present

## 2012-04-08 DIAGNOSIS — N401 Enlarged prostate with lower urinary tract symptoms: Secondary | ICD-10-CM | POA: Diagnosis not present

## 2012-04-08 DIAGNOSIS — N138 Other obstructive and reflux uropathy: Secondary | ICD-10-CM | POA: Diagnosis not present

## 2012-05-17 DIAGNOSIS — N138 Other obstructive and reflux uropathy: Secondary | ICD-10-CM | POA: Diagnosis not present

## 2012-05-17 DIAGNOSIS — N3941 Urge incontinence: Secondary | ICD-10-CM | POA: Diagnosis not present

## 2012-05-17 DIAGNOSIS — N401 Enlarged prostate with lower urinary tract symptoms: Secondary | ICD-10-CM | POA: Diagnosis not present

## 2012-05-23 DIAGNOSIS — Z79899 Other long term (current) drug therapy: Secondary | ICD-10-CM | POA: Diagnosis not present

## 2012-05-23 DIAGNOSIS — R358 Other polyuria: Secondary | ICD-10-CM | POA: Diagnosis not present

## 2012-05-23 DIAGNOSIS — R7309 Other abnormal glucose: Secondary | ICD-10-CM | POA: Diagnosis not present

## 2012-05-23 DIAGNOSIS — E785 Hyperlipidemia, unspecified: Secondary | ICD-10-CM | POA: Diagnosis not present

## 2012-05-23 DIAGNOSIS — R3589 Other polyuria: Secondary | ICD-10-CM | POA: Diagnosis not present

## 2012-05-23 DIAGNOSIS — I10 Essential (primary) hypertension: Secondary | ICD-10-CM | POA: Diagnosis not present

## 2012-05-31 DIAGNOSIS — M6281 Muscle weakness (generalized): Secondary | ICD-10-CM | POA: Diagnosis not present

## 2012-05-31 DIAGNOSIS — N401 Enlarged prostate with lower urinary tract symptoms: Secondary | ICD-10-CM | POA: Diagnosis not present

## 2012-05-31 DIAGNOSIS — N138 Other obstructive and reflux uropathy: Secondary | ICD-10-CM | POA: Diagnosis not present

## 2012-05-31 DIAGNOSIS — N3941 Urge incontinence: Secondary | ICD-10-CM | POA: Diagnosis not present

## 2012-06-14 DIAGNOSIS — R279 Unspecified lack of coordination: Secondary | ICD-10-CM | POA: Diagnosis not present

## 2012-06-14 DIAGNOSIS — M6281 Muscle weakness (generalized): Secondary | ICD-10-CM | POA: Diagnosis not present

## 2012-06-14 DIAGNOSIS — N3941 Urge incontinence: Secondary | ICD-10-CM | POA: Diagnosis not present

## 2012-06-14 DIAGNOSIS — N138 Other obstructive and reflux uropathy: Secondary | ICD-10-CM | POA: Diagnosis not present

## 2012-06-14 DIAGNOSIS — N401 Enlarged prostate with lower urinary tract symptoms: Secondary | ICD-10-CM | POA: Diagnosis not present

## 2012-06-28 DIAGNOSIS — N393 Stress incontinence (female) (male): Secondary | ICD-10-CM | POA: Diagnosis not present

## 2012-06-28 DIAGNOSIS — M6281 Muscle weakness (generalized): Secondary | ICD-10-CM | POA: Diagnosis not present

## 2012-06-28 DIAGNOSIS — N3941 Urge incontinence: Secondary | ICD-10-CM | POA: Diagnosis not present

## 2012-06-28 DIAGNOSIS — R279 Unspecified lack of coordination: Secondary | ICD-10-CM | POA: Diagnosis not present

## 2012-07-06 DIAGNOSIS — N393 Stress incontinence (female) (male): Secondary | ICD-10-CM | POA: Diagnosis not present

## 2012-07-06 DIAGNOSIS — N3941 Urge incontinence: Secondary | ICD-10-CM | POA: Diagnosis not present

## 2012-07-06 DIAGNOSIS — R279 Unspecified lack of coordination: Secondary | ICD-10-CM | POA: Diagnosis not present

## 2012-07-06 DIAGNOSIS — M6281 Muscle weakness (generalized): Secondary | ICD-10-CM | POA: Diagnosis not present

## 2012-07-19 DIAGNOSIS — M6281 Muscle weakness (generalized): Secondary | ICD-10-CM | POA: Diagnosis not present

## 2012-07-19 DIAGNOSIS — N393 Stress incontinence (female) (male): Secondary | ICD-10-CM | POA: Diagnosis not present

## 2012-07-19 DIAGNOSIS — R279 Unspecified lack of coordination: Secondary | ICD-10-CM | POA: Diagnosis not present

## 2012-07-21 DIAGNOSIS — Z79899 Other long term (current) drug therapy: Secondary | ICD-10-CM | POA: Diagnosis not present

## 2012-07-21 DIAGNOSIS — I10 Essential (primary) hypertension: Secondary | ICD-10-CM | POA: Diagnosis not present

## 2012-08-04 DIAGNOSIS — N393 Stress incontinence (female) (male): Secondary | ICD-10-CM | POA: Diagnosis not present

## 2012-08-04 DIAGNOSIS — R279 Unspecified lack of coordination: Secondary | ICD-10-CM | POA: Diagnosis not present

## 2012-08-04 DIAGNOSIS — M6281 Muscle weakness (generalized): Secondary | ICD-10-CM | POA: Diagnosis not present

## 2012-08-04 DIAGNOSIS — N3941 Urge incontinence: Secondary | ICD-10-CM | POA: Diagnosis not present

## 2012-08-18 DIAGNOSIS — N393 Stress incontinence (female) (male): Secondary | ICD-10-CM | POA: Diagnosis not present

## 2012-08-18 DIAGNOSIS — N3941 Urge incontinence: Secondary | ICD-10-CM | POA: Diagnosis not present

## 2012-08-18 DIAGNOSIS — R279 Unspecified lack of coordination: Secondary | ICD-10-CM | POA: Diagnosis not present

## 2012-08-18 DIAGNOSIS — M6281 Muscle weakness (generalized): Secondary | ICD-10-CM | POA: Diagnosis not present

## 2012-08-24 DIAGNOSIS — N401 Enlarged prostate with lower urinary tract symptoms: Secondary | ICD-10-CM | POA: Diagnosis not present

## 2012-08-24 DIAGNOSIS — N138 Other obstructive and reflux uropathy: Secondary | ICD-10-CM | POA: Diagnosis not present

## 2012-08-24 DIAGNOSIS — N3941 Urge incontinence: Secondary | ICD-10-CM | POA: Diagnosis not present

## 2012-09-01 DIAGNOSIS — M6281 Muscle weakness (generalized): Secondary | ICD-10-CM | POA: Diagnosis not present

## 2012-09-01 DIAGNOSIS — R279 Unspecified lack of coordination: Secondary | ICD-10-CM | POA: Diagnosis not present

## 2012-09-01 DIAGNOSIS — N393 Stress incontinence (female) (male): Secondary | ICD-10-CM | POA: Diagnosis not present

## 2012-09-05 DIAGNOSIS — R609 Edema, unspecified: Secondary | ICD-10-CM | POA: Diagnosis not present

## 2012-09-05 DIAGNOSIS — R209 Unspecified disturbances of skin sensation: Secondary | ICD-10-CM | POA: Diagnosis not present

## 2012-09-05 DIAGNOSIS — R079 Chest pain, unspecified: Secondary | ICD-10-CM | POA: Diagnosis not present

## 2012-09-05 DIAGNOSIS — I10 Essential (primary) hypertension: Secondary | ICD-10-CM | POA: Diagnosis not present

## 2012-09-06 DIAGNOSIS — R209 Unspecified disturbances of skin sensation: Secondary | ICD-10-CM | POA: Diagnosis not present

## 2012-09-14 DIAGNOSIS — H04129 Dry eye syndrome of unspecified lacrimal gland: Secondary | ICD-10-CM | POA: Diagnosis not present

## 2012-09-14 DIAGNOSIS — H40029 Open angle with borderline findings, high risk, unspecified eye: Secondary | ICD-10-CM | POA: Diagnosis not present

## 2012-09-14 DIAGNOSIS — H18519 Endothelial corneal dystrophy, unspecified eye: Secondary | ICD-10-CM | POA: Diagnosis not present

## 2012-09-14 DIAGNOSIS — H2510 Age-related nuclear cataract, unspecified eye: Secondary | ICD-10-CM | POA: Diagnosis not present

## 2012-10-11 DIAGNOSIS — E785 Hyperlipidemia, unspecified: Secondary | ICD-10-CM | POA: Diagnosis not present

## 2012-10-11 DIAGNOSIS — I10 Essential (primary) hypertension: Secondary | ICD-10-CM | POA: Diagnosis not present

## 2012-10-11 DIAGNOSIS — R079 Chest pain, unspecified: Secondary | ICD-10-CM | POA: Diagnosis not present

## 2012-10-24 DIAGNOSIS — R079 Chest pain, unspecified: Secondary | ICD-10-CM | POA: Diagnosis not present

## 2012-10-31 DIAGNOSIS — R079 Chest pain, unspecified: Secondary | ICD-10-CM | POA: Diagnosis not present

## 2012-10-31 DIAGNOSIS — E785 Hyperlipidemia, unspecified: Secondary | ICD-10-CM | POA: Diagnosis not present

## 2012-10-31 DIAGNOSIS — I10 Essential (primary) hypertension: Secondary | ICD-10-CM | POA: Diagnosis not present

## 2012-12-27 DIAGNOSIS — E785 Hyperlipidemia, unspecified: Secondary | ICD-10-CM | POA: Diagnosis not present

## 2013-01-05 DIAGNOSIS — R209 Unspecified disturbances of skin sensation: Secondary | ICD-10-CM | POA: Diagnosis not present

## 2013-01-05 DIAGNOSIS — I10 Essential (primary) hypertension: Secondary | ICD-10-CM | POA: Diagnosis not present

## 2013-01-05 DIAGNOSIS — E559 Vitamin D deficiency, unspecified: Secondary | ICD-10-CM | POA: Diagnosis not present

## 2013-01-05 DIAGNOSIS — E785 Hyperlipidemia, unspecified: Secondary | ICD-10-CM | POA: Diagnosis not present

## 2013-01-05 DIAGNOSIS — Z0000 Encounter for general adult medical examination without abnormal findings: Secondary | ICD-10-CM | POA: Diagnosis not present

## 2013-01-30 DIAGNOSIS — Z23 Encounter for immunization: Secondary | ICD-10-CM | POA: Diagnosis not present

## 2013-01-31 DIAGNOSIS — I10 Essential (primary) hypertension: Secondary | ICD-10-CM | POA: Diagnosis not present

## 2013-01-31 DIAGNOSIS — R079 Chest pain, unspecified: Secondary | ICD-10-CM | POA: Diagnosis not present

## 2013-01-31 DIAGNOSIS — E785 Hyperlipidemia, unspecified: Secondary | ICD-10-CM | POA: Diagnosis not present

## 2013-02-13 DIAGNOSIS — H9190 Unspecified hearing loss, unspecified ear: Secondary | ICD-10-CM | POA: Diagnosis not present

## 2013-02-13 DIAGNOSIS — I10 Essential (primary) hypertension: Secondary | ICD-10-CM | POA: Diagnosis not present

## 2013-02-13 DIAGNOSIS — Z79899 Other long term (current) drug therapy: Secondary | ICD-10-CM | POA: Diagnosis not present

## 2013-02-14 DIAGNOSIS — H905 Unspecified sensorineural hearing loss: Secondary | ICD-10-CM | POA: Diagnosis not present

## 2013-02-14 DIAGNOSIS — H903 Sensorineural hearing loss, bilateral: Secondary | ICD-10-CM | POA: Diagnosis not present

## 2013-02-14 DIAGNOSIS — H9120 Sudden idiopathic hearing loss, unspecified ear: Secondary | ICD-10-CM | POA: Diagnosis not present

## 2013-02-20 DIAGNOSIS — N393 Stress incontinence (female) (male): Secondary | ICD-10-CM | POA: Diagnosis not present

## 2013-02-20 DIAGNOSIS — N401 Enlarged prostate with lower urinary tract symptoms: Secondary | ICD-10-CM | POA: Diagnosis not present

## 2013-02-20 DIAGNOSIS — N138 Other obstructive and reflux uropathy: Secondary | ICD-10-CM | POA: Diagnosis not present

## 2013-02-28 DIAGNOSIS — H905 Unspecified sensorineural hearing loss: Secondary | ICD-10-CM | POA: Diagnosis not present

## 2013-02-28 DIAGNOSIS — H9120 Sudden idiopathic hearing loss, unspecified ear: Secondary | ICD-10-CM | POA: Diagnosis not present

## 2013-02-28 DIAGNOSIS — H903 Sensorineural hearing loss, bilateral: Secondary | ICD-10-CM | POA: Diagnosis not present

## 2013-05-31 DIAGNOSIS — R079 Chest pain, unspecified: Secondary | ICD-10-CM | POA: Diagnosis not present

## 2013-05-31 DIAGNOSIS — I10 Essential (primary) hypertension: Secondary | ICD-10-CM | POA: Diagnosis not present

## 2013-05-31 DIAGNOSIS — E785 Hyperlipidemia, unspecified: Secondary | ICD-10-CM | POA: Diagnosis not present

## 2013-07-18 DIAGNOSIS — I10 Essential (primary) hypertension: Secondary | ICD-10-CM | POA: Diagnosis not present

## 2013-07-18 DIAGNOSIS — Z23 Encounter for immunization: Secondary | ICD-10-CM | POA: Diagnosis not present

## 2013-07-18 DIAGNOSIS — Z79899 Other long term (current) drug therapy: Secondary | ICD-10-CM | POA: Diagnosis not present

## 2013-07-18 DIAGNOSIS — E559 Vitamin D deficiency, unspecified: Secondary | ICD-10-CM | POA: Diagnosis not present

## 2013-07-18 DIAGNOSIS — R635 Abnormal weight gain: Secondary | ICD-10-CM | POA: Diagnosis not present

## 2013-07-18 DIAGNOSIS — E785 Hyperlipidemia, unspecified: Secondary | ICD-10-CM | POA: Diagnosis not present

## 2013-07-18 DIAGNOSIS — E669 Obesity, unspecified: Secondary | ICD-10-CM | POA: Diagnosis not present

## 2013-07-18 DIAGNOSIS — R7309 Other abnormal glucose: Secondary | ICD-10-CM | POA: Diagnosis not present

## 2013-08-21 DIAGNOSIS — J069 Acute upper respiratory infection, unspecified: Secondary | ICD-10-CM | POA: Diagnosis not present

## 2013-08-21 DIAGNOSIS — E669 Obesity, unspecified: Secondary | ICD-10-CM | POA: Diagnosis not present

## 2013-08-21 DIAGNOSIS — R7309 Other abnormal glucose: Secondary | ICD-10-CM | POA: Diagnosis not present

## 2013-08-21 DIAGNOSIS — I10 Essential (primary) hypertension: Secondary | ICD-10-CM | POA: Diagnosis not present

## 2013-09-01 DIAGNOSIS — N401 Enlarged prostate with lower urinary tract symptoms: Secondary | ICD-10-CM | POA: Diagnosis not present

## 2013-09-01 DIAGNOSIS — N138 Other obstructive and reflux uropathy: Secondary | ICD-10-CM | POA: Diagnosis not present

## 2013-09-01 DIAGNOSIS — N139 Obstructive and reflux uropathy, unspecified: Secondary | ICD-10-CM | POA: Diagnosis not present

## 2013-09-01 DIAGNOSIS — N3941 Urge incontinence: Secondary | ICD-10-CM | POA: Diagnosis not present

## 2013-09-15 DIAGNOSIS — H04129 Dry eye syndrome of unspecified lacrimal gland: Secondary | ICD-10-CM | POA: Diagnosis not present

## 2013-09-15 DIAGNOSIS — H2589 Other age-related cataract: Secondary | ICD-10-CM | POA: Diagnosis not present

## 2013-09-15 DIAGNOSIS — H40039 Anatomical narrow angle, unspecified eye: Secondary | ICD-10-CM | POA: Diagnosis not present

## 2013-09-26 DIAGNOSIS — I10 Essential (primary) hypertension: Secondary | ICD-10-CM | POA: Diagnosis not present

## 2013-09-26 DIAGNOSIS — R079 Chest pain, unspecified: Secondary | ICD-10-CM | POA: Diagnosis not present

## 2013-09-26 DIAGNOSIS — E785 Hyperlipidemia, unspecified: Secondary | ICD-10-CM | POA: Diagnosis not present

## 2013-11-30 DIAGNOSIS — E785 Hyperlipidemia, unspecified: Secondary | ICD-10-CM | POA: Diagnosis not present

## 2014-02-07 DIAGNOSIS — R079 Chest pain, unspecified: Secondary | ICD-10-CM | POA: Diagnosis not present

## 2014-02-07 DIAGNOSIS — E785 Hyperlipidemia, unspecified: Secondary | ICD-10-CM | POA: Diagnosis not present

## 2014-02-07 DIAGNOSIS — I10 Essential (primary) hypertension: Secondary | ICD-10-CM | POA: Diagnosis not present

## 2014-02-14 ENCOUNTER — Encounter (HOSPITAL_COMMUNITY): Payer: Self-pay | Admitting: Pharmacy Technician

## 2014-02-23 DIAGNOSIS — R079 Chest pain, unspecified: Secondary | ICD-10-CM | POA: Diagnosis not present

## 2014-02-23 DIAGNOSIS — Z01810 Encounter for preprocedural cardiovascular examination: Secondary | ICD-10-CM | POA: Diagnosis not present

## 2014-02-25 DIAGNOSIS — R079 Chest pain, unspecified: Secondary | ICD-10-CM | POA: Diagnosis present

## 2014-02-25 DIAGNOSIS — R0789 Other chest pain: Secondary | ICD-10-CM | POA: Diagnosis present

## 2014-02-27 ENCOUNTER — Encounter (HOSPITAL_COMMUNITY): Payer: Self-pay | Admitting: General Practice

## 2014-02-27 ENCOUNTER — Other Ambulatory Visit: Payer: Self-pay

## 2014-02-27 ENCOUNTER — Ambulatory Visit (HOSPITAL_COMMUNITY)
Admission: RE | Admit: 2014-02-27 | Discharge: 2014-02-28 | Disposition: A | Discharge status: Home / Self Care | Payer: Medicare Other | Source: Ambulatory Visit | Attending: Cardiology | Admitting: Cardiology

## 2014-02-27 ENCOUNTER — Encounter (HOSPITAL_COMMUNITY)
Admission: RE | Disposition: A | Discharge status: Home / Self Care | Payer: Federal, State, Local not specified - PPO | Source: Ambulatory Visit | Attending: Cardiology

## 2014-02-27 DIAGNOSIS — G35 Multiple sclerosis: Secondary | ICD-10-CM

## 2014-02-27 DIAGNOSIS — R079 Chest pain, unspecified: Secondary | ICD-10-CM | POA: Diagnosis present

## 2014-02-27 DIAGNOSIS — R0789 Other chest pain: Secondary | ICD-10-CM | POA: Diagnosis present

## 2014-02-27 DIAGNOSIS — E785 Hyperlipidemia, unspecified: Secondary | ICD-10-CM | POA: Diagnosis not present

## 2014-02-27 DIAGNOSIS — I25119 Atherosclerotic heart disease of native coronary artery with unspecified angina pectoris: Secondary | ICD-10-CM | POA: Insufficient documentation

## 2014-02-27 DIAGNOSIS — I200 Unstable angina: Secondary | ICD-10-CM | POA: Diagnosis not present

## 2014-02-27 DIAGNOSIS — I25110 Atherosclerotic heart disease of native coronary artery with unstable angina pectoris: Secondary | ICD-10-CM | POA: Diagnosis not present

## 2014-02-27 DIAGNOSIS — Z23 Encounter for immunization: Secondary | ICD-10-CM | POA: Insufficient documentation

## 2014-02-27 DIAGNOSIS — I10 Essential (primary) hypertension: Secondary | ICD-10-CM | POA: Diagnosis not present

## 2014-02-27 DIAGNOSIS — I209 Angina pectoris, unspecified: Secondary | ICD-10-CM

## 2014-02-27 DIAGNOSIS — Z9861 Coronary angioplasty status: Secondary | ICD-10-CM

## 2014-02-27 DIAGNOSIS — I208 Other forms of angina pectoris: Secondary | ICD-10-CM

## 2014-02-27 DIAGNOSIS — I2089 Other forms of angina pectoris: Secondary | ICD-10-CM

## 2014-02-27 HISTORY — PX: LEFT HEART CATHETERIZATION WITH CORONARY ANGIOGRAM: SHX5451

## 2014-02-27 HISTORY — DX: Atherosclerotic heart disease of native coronary artery without angina pectoris: I25.10

## 2014-02-27 HISTORY — PX: PERCUTANEOUS CORONARY STENT INTERVENTION (PCI-S): SHX5485

## 2014-02-27 HISTORY — PX: CORONARY STENT PLACEMENT: SHX1402

## 2014-02-27 LAB — POCT ACTIVATED CLOTTING TIME
Activated Clotting Time: 180 seconds
Activated Clotting Time: 473 seconds

## 2014-02-27 LAB — GLUCOSE, CAPILLARY
Glucose-Capillary: 96 mg/dL (ref 70–99)
Glucose-Capillary: 119 mg/dL — ABNORMAL HIGH (ref 70–99)

## 2014-02-27 SURGERY — LEFT HEART CATHETERIZATION WITH CORONARY ANGIOGRAM
Anesthesia: LOCAL

## 2014-02-27 MED ORDER — PNEUMOCOCCAL VAC POLYVALENT 25 MCG/0.5ML IJ INJ
0.5000 mL | INJECTION | Freq: Once | INTRAMUSCULAR | Status: AC
  Administered 2014-02-28: 10:00:00 0.5 mL via INTRAMUSCULAR
  Filled 2014-02-27: qty 0.5

## 2014-02-27 MED ORDER — NITROGLYCERIN 1 MG/10 ML FOR IR/CATH LAB
INTRA_ARTERIAL | Status: AC
  Filled 2014-02-27: qty 10

## 2014-02-27 MED ORDER — SODIUM CHLORIDE 0.9 % IJ SOLN: 3.0000 mL | Freq: Two times a day (BID) | INTRAMUSCULAR | Status: DC

## 2014-02-27 MED ORDER — SODIUM CHLORIDE 0.9 % IJ SOLN: 3.0000 mL | INTRAMUSCULAR | Status: DC | PRN

## 2014-02-27 MED ORDER — VITAMIN B-12 1000 MCG PO TABS
1000.0000 ug | ORAL_TABLET | Freq: Every day | ORAL | Status: DC
  Administered 2014-02-27 – 2014-02-28 (×2): 1000 ug via ORAL
  Filled 2014-02-27 (×2): qty 1

## 2014-02-27 MED ORDER — SODIUM CHLORIDE 0.9 % IV SOLN
INTRAVENOUS | Status: DC
  Administered 2014-02-27: 10:00:00 via INTRAVENOUS

## 2014-02-27 MED ORDER — VERAPAMIL HCL 2.5 MG/ML IV SOLN
INTRAVENOUS | Status: AC
  Filled 2014-02-27: qty 2

## 2014-02-27 MED ORDER — ONDANSETRON HCL 4 MG/2ML IJ SOLN: 4.0000 mg | Freq: Four times a day (QID) | INTRAMUSCULAR | Status: DC | PRN

## 2014-02-27 MED ORDER — SODIUM CHLORIDE 0.9 % IV SOLN: 250.0000 mL | INTRAVENOUS | Status: DC | PRN

## 2014-02-27 MED ORDER — PREDNISOLONE ACETATE 1 % OP SUSP
1.0000 [drp] | Freq: Four times a day (QID) | OPHTHALMIC | Status: DC
Start: 2014-02-27 — End: 2014-02-28
  Administered 2014-02-27 – 2014-02-28 (×4): 1 [drp] via OPHTHALMIC
  Filled 2014-02-27: qty 1

## 2014-02-27 MED ORDER — VITAMIN E 180 MG (400 UNIT) PO CAPS
400.0000 [IU] | ORAL_CAPSULE | Freq: Every day | ORAL | Status: DC
Start: 2014-02-27 — End: 2014-02-28
  Administered 2014-02-27 – 2014-02-28 (×2): 400 [IU] via ORAL
  Filled 2014-02-27 (×2): qty 1

## 2014-02-27 MED ORDER — HYDROCHLOROTHIAZIDE 25 MG PO TABS
25.0000 mg | ORAL_TABLET | Freq: Every day | ORAL | Status: DC
Start: 2014-02-28 — End: 2014-02-28
  Administered 2014-02-28: 09:00:00 25 mg via ORAL
  Filled 2014-02-27: qty 1

## 2014-02-27 MED ORDER — INFLUENZA VAC SPLIT QUAD 0.5 ML IM SUSY
0.5000 mL | PREFILLED_SYRINGE | INTRAMUSCULAR | Status: AC
  Administered 2014-02-28: 09:00:00 0.5 mL via INTRAMUSCULAR
  Filled 2014-02-27: qty 0.5

## 2014-02-27 MED ORDER — MAGNESIUM GLUCONATE 500 MG PO TABS
500.0000 mg | ORAL_TABLET | Freq: Every day | ORAL | Status: DC
  Administered 2014-02-27 – 2014-02-28 (×2): 500 mg via ORAL
  Filled 2014-02-27 (×2): qty 1

## 2014-02-27 MED ORDER — FESOTERODINE FUMARATE ER 8 MG PO TB24
8.0000 mg | ORAL_TABLET | Freq: Every day | ORAL | Status: DC
  Administered 2014-02-27: 8 mg via ORAL
  Filled 2014-02-27 (×2): qty 1

## 2014-02-27 MED ORDER — METOPROLOL SUCCINATE ER 100 MG PO TB24
100.0000 mg | ORAL_TABLET | Freq: Every day | ORAL | Status: DC
  Administered 2014-02-28: 100 mg via ORAL
  Filled 2014-02-27: qty 1

## 2014-02-27 MED ORDER — ATORVASTATIN CALCIUM 20 MG PO TABS
20.0000 mg | ORAL_TABLET | Freq: Every day | ORAL | Status: DC
  Administered 2014-02-27: 19:00:00 20 mg via ORAL
  Filled 2014-02-27 (×2): qty 1

## 2014-02-27 MED ORDER — HEPARIN (PORCINE) IN NACL 2-0.9 UNIT/ML-% IJ SOLN
INTRAMUSCULAR | Status: AC
  Filled 2014-02-27: qty 1000

## 2014-02-27 MED ORDER — VALSARTAN-HYDROCHLOROTHIAZIDE 320-25 MG PO TABS: 1.0000 | ORAL_TABLET | Freq: Every day | ORAL | Status: DC

## 2014-02-27 MED ORDER — SODIUM CHLORIDE 0.9 % IV SOLN
1.0000 mL/kg/h | INTRAVENOUS | Status: AC
  Administered 2014-02-27 (×2): 1 mL/kg/h via INTRAVENOUS

## 2014-02-27 MED ORDER — LIDOCAINE HCL (PF) 1 % IJ SOLN
INTRAMUSCULAR | Status: AC
  Filled 2014-02-27: qty 30

## 2014-02-27 MED ORDER — HYDROMORPHONE HCL 1 MG/ML IJ SOLN
INTRAMUSCULAR | Status: AC
  Filled 2014-02-27: qty 1

## 2014-02-27 MED ORDER — HEPARIN SODIUM (PORCINE) 1000 UNIT/ML IJ SOLN
INTRAMUSCULAR | Status: AC
  Filled 2014-02-27: qty 1

## 2014-02-27 MED ORDER — MIDAZOLAM HCL 2 MG/2ML IJ SOLN
INTRAMUSCULAR | Status: AC
  Filled 2014-02-27: qty 2

## 2014-02-27 MED ORDER — ASPIRIN 81 MG PO CHEW
81.0000 mg | CHEWABLE_TABLET | ORAL | Status: AC
  Administered 2014-02-27: 81 mg via ORAL

## 2014-02-27 MED ORDER — NITROGLYCERIN 0.4 MG SL SUBL: 0.4000 mg | SUBLINGUAL_TABLET | SUBLINGUAL | Status: DC | PRN

## 2014-02-27 MED ORDER — ACETAMINOPHEN 325 MG PO TABS
650.0000 mg | ORAL_TABLET | ORAL | Status: DC | PRN
  Administered 2014-02-27: 650 mg via ORAL
  Filled 2014-02-27: qty 2

## 2014-02-27 MED ORDER — ASPIRIN 81 MG PO CHEW
81.0000 mg | CHEWABLE_TABLET | Freq: Every day | ORAL | Status: DC
  Administered 2014-02-28: 09:00:00 81 mg via ORAL
  Filled 2014-02-27: qty 1

## 2014-02-27 MED ORDER — ALPRAZOLAM 0.5 MG PO TABS: 1.0000 mg | ORAL_TABLET | Freq: Two times a day (BID) | ORAL | Status: DC | PRN

## 2014-02-27 MED ORDER — CLOPIDOGREL BISULFATE 75 MG PO TABS
75.0000 mg | ORAL_TABLET | Freq: Every day | ORAL | Status: DC
  Administered 2014-02-28: 09:00:00 75 mg via ORAL
  Filled 2014-02-27: qty 1

## 2014-02-27 MED ORDER — DILTIAZEM HCL ER 120 MG PO CP24
120.0000 mg | ORAL_CAPSULE | Freq: Every day | ORAL | Status: DC
  Administered 2014-02-27 – 2014-02-28 (×2): 120 mg via ORAL
  Filled 2014-02-27 (×2): qty 1

## 2014-02-27 MED ORDER — DIAZEPAM 5 MG PO TABS: 5.0000 mg | ORAL_TABLET | ORAL | Status: DC | PRN

## 2014-02-27 MED ORDER — VITAMIN B-6 100 MG PO TABS
100.0000 mg | ORAL_TABLET | Freq: Every day | ORAL | Status: DC
  Administered 2014-02-27 – 2014-02-28 (×2): 100 mg via ORAL
  Filled 2014-02-27 (×2): qty 1

## 2014-02-27 MED ORDER — CLOPIDOGREL BISULFATE 300 MG PO TABS
ORAL_TABLET | ORAL | Status: AC
  Filled 2014-02-27: qty 2

## 2014-02-27 MED ORDER — IRBESARTAN 300 MG PO TABS
300.0000 mg | ORAL_TABLET | Freq: Every day | ORAL | Status: DC
  Administered 2014-02-28: 09:00:00 300 mg via ORAL
  Filled 2014-02-27: qty 1

## 2014-02-27 MED ORDER — BIVALIRUDIN 250 MG IV SOLR
INTRAVENOUS | Status: AC
  Filled 2014-02-27: qty 250

## 2014-02-27 MED ORDER — ASPIRIN 81 MG PO CHEW
CHEWABLE_TABLET | ORAL | Status: AC
  Administered 2014-02-27: 81 mg via ORAL
  Filled 2014-02-27: qty 1

## 2014-02-27 NOTE — Plan of Care (Signed)
Problem: Consults Goal: PCI Patient Education (See Patient Education module for education specifics.) Outcome: Completed/Met Date Met:  02/27/14

## 2014-02-27 NOTE — CV Procedure (Signed)
Procedure performed:  Left heart catheterization including hemodynamic monitoring of the left ventricle, LV gram. Selective right and left coronary arteriography. PTCA and stenting of the distal RCA with implantation of a 3.0 x 28 mm Promus Premier DES.  Indication: Patient is a 68 year-old African-Americanmale with history of hypertension,  hyperlipidemia, who presents with class III angina pectoris in spite of aggressive medical therapy. Patient has  had abnormal non invasive testing which had revealed a small sized inferior wall severe ischemia, overall considered a low risk and hence medical therapy was recommended. Due to persistent symptoms and worsening symptoms of angina pectoris brought to the cardiac catheterization lab to evaluate  coronary anatomy for definitive diagnosis of CAD.  Hemodynamic data: Left ventricular pressure was 117/8 with LVEDP of 14 mm mercury. Aortic pressure was 122/75 with a mean of 96 mm mercury. There was no pressure gradient across the aortic valve.   Left ventricle: Performed in the RAO projection revealed LVEF of 60%. There was no significant MR. no wall motion abnormality.  Right coronary artery:  It has an anterior origin. Dominant. There is mild diffuse disease in the proximal and midsegment. It gives origin to several small PL branches followed by a very large PDA. The PDA has a high-grade 80% stenosis and encompasses the distal RCA. Is a large calibered vessel. Distal PDA is smooth and normal.  Left main coronary artery is large and normal.  Circumflex coronary artery: A large vessel giving origin to several small marginals and in the distal and continues as the AV groove circumflex after giving origin to a large obtuse marginal. Mid to distal segment of the circumflex is smooth and normal, proximal segment shows mild disease.  Ramus intermediate:  It is a large caliber vessel, there is a 20-30% stenosis in the proximal and midsegment. Distal segment is  smooth and normal.  LAD:  LAD gives origin to 2 moderate-sized diagonals and several small diagonals.  LAD has mild diffuse luminal irregularities.   Impression: single vessel significant coronary artery disease involving the distal right coronary artery/PDA. This is in the distribution of the abnormal stress test. Due to his symptoms of class III angina pectoris in spite of medical therapy will proceed with intervention.  Interventional data: Successful PTCA and stenting of the distal RCA with implantation of a 3.0 x 28 mm Promus Premier DES.  Will need Dual antiplatelet therapy with Plavix and ASA 81 mg for at least one year.   Technique of diagnostic cardiac catheterization:  Under sterile precautions using a 6 French right radial  arterial access, a 6 French sheath was introduced into the right radial artery. A 5 [place] Tig 4 catheter was advanced into the ascending aorta selective  right coronary artery and left coronary artery was cannulated and angiography was performed in multiple views. The catheter was pulled back Out of the body over exchange length J-wire. Same Catheter was used to perform LV gram which was performed in RAO projection. Catheter exchanged out of the body over J-Wire. NO immediate complications noted.  Patient tolerated the procedure well.   Technique of intervention:  Using a 6 [place] Ikari 1.0, 78F guide catheter the RIGHT  coronary  was selected and cannulated. Using Angiomax for anticoagulation, I utilized a cougar XT 0.014" x 190 cmguidewire and across the distal right coronary artery without any difficulty. I placed the tip of the wire into the distal  coronary artery. Angiography was performed.  I proceeded with implantation of a 3.0x28 mm  Promus Premier  drug-eluting stent into the distal right/PD coronary artery. The stent was deployed at 14 atmospheric pressure for 50  seconds. The stent was then post dilated with a 3.25x20 mm Henderson balloon at 16 atmospheric pressure  for 45 seconds. Post-balloon angioplasty results were excellent with 0% residual stenoses and TIMI-3 flow was maintained. There was no evidence of edge dissection. The guidewire was withdrawn out of the body and the guide catheter was engaged and pulled out of the body over the J-wire the was no immediate complication. Patient tolerated the procedure well.  Disposition: Patient will be discharged in morning unless complications with out-patient follow up.

## 2014-02-27 NOTE — Progress Notes (Signed)
V BAND REMOVAL  LOCATION:    right radial  DEFLATED PER PROTOCOL:    Yes.    TIME BAND OFF / DRESSING APPLIED:    1600  SITE UPON ARRIVAL:    Level 0  SITE AFTER BAND REMOVAL:    Level 0  REVERSE ALLEN'S TEST:     positive  CIRCULATION SENSATION AND MOVEMENT:    Within Normal Limits   Yes.    COMMENTS:   Tolerated procedure well

## 2014-02-27 NOTE — Interval H&P Note (Signed)
History and Physical Interval Note:  02/27/2014 10:52 AM  [name]  has presented today for surgery, with the diagnosis of cp  The various methods of treatment have been discussed with the patient and family. After consideration of risks, benefits and other options for treatment, the patient has consented to  Procedure(s): LEFT HEART CATHETERIZATION WITH CORONARY ANGIOGRAM (N/A) and possible PCI as a surgical intervention .  The patient's history has been reviewed, patient examined, no change in status, stable for surgery.  I have reviewed the patient's chart and labs.  Questions were answered to the patient's satisfaction.   Cath Lab Visit (complete for each Cath Lab visit)  Clinical Evaluation Leading to the Procedure:   ACS: No.  Non-ACS:    Anginal Classification: CCS III  Anti-ischemic medical therapy: Maximal Therapy (2 or more classes of medications)  Non-Invasive Test Results: Low-risk stress test findings: cardiac mortality <1%/year  Prior CABG: No previous CABG        [facility]

## 2014-02-27 NOTE — H&P (Signed)
  Please see office visit notes for complete details of HPI.  

## 2014-02-28 DIAGNOSIS — E785 Hyperlipidemia, unspecified: Secondary | ICD-10-CM | POA: Diagnosis not present

## 2014-02-28 DIAGNOSIS — I10 Essential (primary) hypertension: Secondary | ICD-10-CM | POA: Diagnosis not present

## 2014-02-28 DIAGNOSIS — I25119 Atherosclerotic heart disease of native coronary artery with unspecified angina pectoris: Secondary | ICD-10-CM | POA: Diagnosis not present

## 2014-02-28 DIAGNOSIS — Z23 Encounter for immunization: Secondary | ICD-10-CM | POA: Diagnosis not present

## 2014-02-28 LAB — BASIC METABOLIC PANEL
Anion gap: 11 (ref 5–15)
BUN: 13 mg/dL (ref 6–23)
CO2: 26 mEq/L (ref 19–32)
Calcium: 8.8 mg/dL (ref 8.4–10.5)
Chloride: 106 mEq/L (ref 96–112)
Creatinine, Ser: 1.04 mg/dL (ref 0.50–1.35)
GFR calc Af Amer: 84 mL/min — ABNORMAL LOW (ref >90)
GFR calc non Af Amer: 72 mL/min — ABNORMAL LOW (ref >90)
Glucose, Bld: 105 mg/dL — ABNORMAL HIGH (ref 70–99)
Potassium: 4 mEq/L (ref 3.7–5.3)
Sodium: 143 mEq/L (ref 137–147)

## 2014-02-28 LAB — CBC
HCT: 39.5 % (ref 39.0–52.0)
Hemoglobin: 13.2 g/dL (ref 13.0–17.0)
MCH: 33.4 pg (ref 26.0–34.0)
MCHC: 33.4 g/dL (ref 30.0–36.0)
MCV: 100 fL (ref 78.0–100.0)
Platelets: 170 10*3/uL (ref 150–400)
RBC: 3.95 MIL/uL — ABNORMAL LOW (ref 4.22–5.81)
RDW: 12.7 % (ref 11.5–15.5)
WBC: 7.5 10*3/uL (ref 4.0–10.5)

## 2014-02-28 LAB — GLUCOSE, CAPILLARY: Glucose-Capillary: 97 mg/dL (ref 70–99)

## 2014-02-28 MED ORDER — ASPIRIN 81 MG PO CHEW
81.0000 mg | CHEWABLE_TABLET | Freq: Every day | ORAL | Status: DC
Start: 2014-02-28 — End: 2014-10-01

## 2014-02-28 MED ORDER — CLOPIDOGREL BISULFATE 75 MG PO TABS: 75.0000 mg | ORAL_TABLET | Freq: Every day | ORAL | Status: DC

## 2014-02-28 MED FILL — Sodium Chloride IV Soln 0.9%: INTRAVENOUS | Qty: 50 | Status: AC

## 2014-02-28 NOTE — Discharge Summary (Signed)
Physician Discharge Summary  Patient ID: Christian [name] MRN: [id number] DOB/AGE: February 09, 1946 68 y.o.  Admit date: 02/27/2014 Discharge date: 02/28/2014  Primary Discharge Diagnosis Coronary artery disease of the native vessels, stenting of the distal RCA with implantation of a 3.0 x 28 mm Promus Premier DES.  Secondary Discharge Diagnosis Hypertension Hyperlipidemia   Significant Diagnostic Studies:  02/27/2014 Hemodynamic data: Left ventricular pressure was 117/8 with LVEDP of 14 mm mercury. Aortic pressure was 122/75 with a mean of 96 mm mercury. There was no pressure gradient across the aortic valve.   Left ventricle: Performed in the RAO projection revealed LVEF of 60%. There was no significant MR. no wall motion abnormality.  Right coronary artery: It has an anterior origin. Dominant. There is mild diffuse disease in the proximal and midsegment. It gives origin to several small PL branches followed by a very large PDA. The PDA has a high-grade 80% stenosis and encompasses the distal RCA. Is a large calibered vessel. Distal PDA is smooth and normal.  Left main coronary artery is large and normal.  Circumflex coronary artery: A large vessel giving origin to several small marginals and in the distal and continues as the AV groove circumflex after giving origin to a large obtuse marginal. Mid to distal segment of the circumflex is smooth and normal, proximal segment shows mild disease.  Ramus intermediate: It is a large caliber vessel, there is a 20-30% stenosis in the proximal and midsegment. Distal segment is smooth and normal.  LAD: LAD gives origin to 2 moderate-sized diagonals and several small diagonals. LAD has mild diffuse luminal irregularities.   Impression: single vessel significant coronary artery disease involving the distal right coronary artery/PDA. This is in the distribution of the abnormal stress test. Due to his symptoms of class III angina pectoris in spite of  medical therapy will proceed with intervention.  Interventional data: Successful PTCA and stenting of the distal RCA with implantation of a 3.0 x 28 mm Promus Premier DES. Will need Dual antiplatelet therapy with Plavix and ASA 81 mg for at least one year.   Consults:   Hospital Course:   Christian [name] is 68 years old African-American male. he has history of chest pain. Lexiscan Myoview scans on 10/24/2012 had revealed very small sized but severe inferior ischemia. He has been treated with medical therapy, but due to worsening symptoms of angina pectoris which was lifestyle limiting, he was recommended coronary angiography.  This revealed a single-vessel coronary artery disease in the distribution of the ischemia, underwent successful angioplasty without any complications.   Recommendations on discharge: patient recommended to start Plavix along with his aspirin.  He'll be on Plavix at least for 1 year.  No other changes in the medications were done.  Discharge Exam: Blood pressure 138/75, pulse 97, temperature 98 F (36.7 C), temperature source Oral, resp. rate 20, height 5\' 9"  (1.753 m), weight 93.3 kg (205 lb 11 oz), SpO2 98 %.    General appearance: alert, cooperative, appears stated age and no distress Resp: clear to auscultation bilaterally Cardio: regular rate and rhythm, S1, S2 normal, no murmur, click, rub or gallop GI: soft, non-tender; bowel sounds normal; no masses,  no organomegaly Extremities: extremities normal, atraumatic, no cyanosis or edema Pulses: 2+ and symmetric right radial arterial access site has healed well. Neurologic: Grossly normal Labs:   Lab Results  Component Value Date   WBC 7.5 02/28/2014   HGB 13.2 02/28/2014   HCT 39.5 02/28/2014   MCV 100.0 02/28/2014  PLT 170 02/28/2014    Recent Labs Lab 02/28/14 0332  NA 143  K 4.0  CL 106  CO2 26  BUN 13  CREATININE 1.04  CALCIUM 8.8  GLUCOSE 105*   No results found for: CKTOTAL, CKMB, CKMBINDEX,  TROPONINI  Lipid Panel  No results found for: CHOL, TRIG, HDL, CHOLHDL, VLDL, LDLCALC  EKG: normal EKG, normal sinus rhythm, unchanged from previous tracings.   FOLLOW UP PLANS AND APPOINTMENTS Discharge Instructions    Amb Referral to Cardiac Rehabilitation    Complete by:  As directed             Medication List    STOP taking these medications        aspirin EC 325 MG tablet  Replaced by:  aspirin 81 MG chewable tablet      TAKE these medications        aspirin 81 MG chewable tablet  Chew 1 tablet (81 mg total) by mouth daily.     atorvastatin 20 MG tablet  Commonly known as:  LIPITOR  Take 20 mg by mouth daily.     CALCIUM 600 + D PO  Take 1 tablet by mouth daily.     ciclopirox 0.77 % cream  Commonly known as:  LOPROX  Apply 1 application topically 2 (two) times daily.     clopidogrel 75 MG tablet  Commonly known as:  PLAVIX  Take 1 tablet (75 mg total) by mouth daily with breakfast.     COD LIVER OIL PO  Take 1 capsule by mouth daily.     diltiazem 120 MG 24 hr capsule  Commonly known as:  DILACOR XR  Take 120 mg by mouth daily.     Fish Oil 1000 MG Caps  Take 1,000 mg by mouth 2 (two) times daily.     Garlic 123XX123 MG Caps  Take 2,000 mg by mouth daily.     magnesium gluconate 500 MG tablet  Commonly known as:  MAGONATE  Take 500 mg by mouth daily.     metoprolol succinate 100 MG 24 hr tablet  Commonly known as:  TOPROL-XL  Take 100 mg by mouth daily. Take with or immediately following a meal.     multivitamin tablet  Take 1 tablet by mouth daily.     nitroGLYCERIN 0.4 MG SL tablet  Commonly known as:  NITROSTAT  Place 0.4 mg under the tongue every 5 (five) minutes as needed for chest pain.     prednisoLONE acetate 1 % ophthalmic suspension  Commonly known as:  PRED FORTE  Place 1 drop into the left eye 4 (four) times daily.     pyridOXINE 100 MG tablet  Commonly known as:  VITAMIN B-6  Take 100 mg by mouth daily.     TOVIAZ 8 MG  Tb24 tablet  Generic drug:  fesoterodine  Take 8 mg by mouth daily.     valsartan-hydrochlorothiazide 320-25 MG per tablet  Commonly known as:  DIOVAN-HCT  Take 1 tablet by mouth daily.     vitamin B-12 1000 MCG tablet  Commonly known as:  CYANOCOBALAMIN  Take 1,000 mcg by mouth daily.     vitamin C 1000 MG tablet  Take 1,000 mg by mouth daily.     Vitamin D-3 5000 UNITS Tabs  Take 5,000 Units by mouth 3 (three) times daily.     vitamin E 400 UNIT capsule  Take 400 Units by mouth daily.     zinc gluconate 50 MG  tablet  Take 50 mg by mouth daily.           Follow-up Information    Follow up with Woody Seller Sylvan Cheese, MD.   Specialty:  Cardiology   Why:  Keep previous appointment   Contact information:   [address] [place] [address] [phone]        Laverda Page, MD 02/28/2014, 9:29 AM  Pager: [phone] Office: [phone] If no answer: [phone]

## 2014-02-28 NOTE — Progress Notes (Signed)
CARDIAC REHAB PHASE I   PRE:  Rate/Rhythm: 91 SR  BP:  Supine: 138/75  Sitting:   Standing:    SaO2:   MODE:  Ambulation: 500 ft   POST:  Rate/Rhythm: 107 ST  BP:  Supine:   Sitting: 168/75  Standing:    SaO2:  0830-0925 Pt walked 500 ft with steady gait.  No CP. Tolerated well. Education completed with pt who voiced understanding. Pt stated he does not like to take medications. Stressed importance of plavix and aspirin with stent. Reviewed NTG use and purpose of BP meds and statins. Gave heart healthy diet and ex ed. Discussed CRP 2 and pt gave permission to refer to [facility].   Graylon Good, RN BSN  02/28/2014 9:21 AM

## 2014-03-01 DIAGNOSIS — R7309 Other abnormal glucose: Secondary | ICD-10-CM | POA: Diagnosis not present

## 2014-03-01 DIAGNOSIS — E782 Mixed hyperlipidemia: Secondary | ICD-10-CM | POA: Diagnosis not present

## 2014-03-01 DIAGNOSIS — Z0000 Encounter for general adult medical examination without abnormal findings: Secondary | ICD-10-CM | POA: Diagnosis not present

## 2014-03-01 DIAGNOSIS — I10 Essential (primary) hypertension: Secondary | ICD-10-CM | POA: Diagnosis not present

## 2014-03-06 DIAGNOSIS — I10 Essential (primary) hypertension: Secondary | ICD-10-CM | POA: Diagnosis not present

## 2014-03-06 DIAGNOSIS — E785 Hyperlipidemia, unspecified: Secondary | ICD-10-CM | POA: Diagnosis not present

## 2014-03-06 DIAGNOSIS — N3941 Urge incontinence: Secondary | ICD-10-CM | POA: Diagnosis not present

## 2014-03-06 DIAGNOSIS — I2510 Atherosclerotic heart disease of native coronary artery without angina pectoris: Secondary | ICD-10-CM | POA: Diagnosis not present

## 2014-04-03 DIAGNOSIS — N3941 Urge incontinence: Secondary | ICD-10-CM | POA: Diagnosis not present

## 2014-04-05 ENCOUNTER — Encounter (HOSPITAL_COMMUNITY): Payer: Self-pay | Admitting: Cardiology

## 2014-04-09 DIAGNOSIS — I25119 Atherosclerotic heart disease of native coronary artery with unspecified angina pectoris: Secondary | ICD-10-CM | POA: Diagnosis not present

## 2014-04-09 DIAGNOSIS — I10 Essential (primary) hypertension: Secondary | ICD-10-CM | POA: Diagnosis not present

## 2014-04-09 DIAGNOSIS — R7309 Other abnormal glucose: Secondary | ICD-10-CM | POA: Diagnosis not present

## 2014-04-09 DIAGNOSIS — E782 Mixed hyperlipidemia: Secondary | ICD-10-CM | POA: Diagnosis not present

## 2014-05-11 DIAGNOSIS — L03032 Cellulitis of left toe: Secondary | ICD-10-CM | POA: Diagnosis not present

## 2014-05-11 DIAGNOSIS — M79672 Pain in left foot: Secondary | ICD-10-CM | POA: Diagnosis not present

## 2014-05-11 DIAGNOSIS — M79671 Pain in right foot: Secondary | ICD-10-CM | POA: Diagnosis not present

## 2014-05-11 DIAGNOSIS — L600 Ingrowing nail: Secondary | ICD-10-CM | POA: Diagnosis not present

## 2014-05-22 DIAGNOSIS — M79609 Pain in unspecified limb: Secondary | ICD-10-CM | POA: Diagnosis not present

## 2014-05-22 DIAGNOSIS — B351 Tinea unguium: Secondary | ICD-10-CM | POA: Diagnosis not present

## 2014-05-29 DIAGNOSIS — Z79899 Other long term (current) drug therapy: Secondary | ICD-10-CM | POA: Diagnosis not present

## 2014-05-29 DIAGNOSIS — N529 Male erectile dysfunction, unspecified: Secondary | ICD-10-CM | POA: Diagnosis not present

## 2014-05-29 DIAGNOSIS — E291 Testicular hypofunction: Secondary | ICD-10-CM | POA: Diagnosis not present

## 2014-05-29 DIAGNOSIS — E785 Hyperlipidemia, unspecified: Secondary | ICD-10-CM | POA: Diagnosis not present

## 2014-05-29 DIAGNOSIS — N3941 Urge incontinence: Secondary | ICD-10-CM | POA: Diagnosis not present

## 2014-05-29 DIAGNOSIS — N401 Enlarged prostate with lower urinary tract symptoms: Secondary | ICD-10-CM | POA: Diagnosis not present

## 2014-05-31 ENCOUNTER — Encounter: Payer: Medicare Other | Attending: Internal Medicine | Admitting: Dietician

## 2014-05-31 ENCOUNTER — Encounter: Payer: Self-pay | Admitting: Dietician

## 2014-05-31 VITALS — Ht 69.0 in | Wt 213.2 lb

## 2014-05-31 DIAGNOSIS — Z713 Dietary counseling and surveillance: Secondary | ICD-10-CM | POA: Insufficient documentation

## 2014-05-31 DIAGNOSIS — E119 Type 2 diabetes mellitus without complications: Secondary | ICD-10-CM

## 2014-05-31 NOTE — Progress Notes (Signed)
Diabetes Self-Management Education  Visit Type:  Initial  Appt. Start Time: 1330 Appt. End Time: 1500  05/31/2014  [name], identified by name and date of birth, is a 69 y.o. male with a diagnosis of Diabetes: Type 2.  Other people present during visit:  Patient   ASSESSMENT  Height 5\' 9"  (1.753 m), weight 213 lb 3.2 oz (96.707 kg). Body mass index is 31.47 kg/(m^2).  Initial Visit Information:  Are you currently following a meal plan?: No   Are you taking your medications as prescribed?: Yes          Psychosocial:     Patient Belief/Attitude about Diabetes: Motivated to manage diabetes Other persons present: Patient Patient Concerns: Nutrition/Meal planning, Medication, Weight Control  Complications:   Last HgB A1C per patient/outside source: 6.9% How often do you check your blood sugar?: Not recommended by provider  Diet Intake:  Breakfast: wheat bread with margarine and jelly, coffee with cream and splenda Snack (morning): none Lunch: skips or bolgna sandwich Snack (afternoon): cheez-itz or popcorn or cheese and crackers or peanut butter and crackers Dinner: steak, peas or black eyed peas, potato or rice and cornbread, grapefruit juice Snack (evening): 2 fig newton cookies and spiced hot tea blend sometimes with splenda Beverage(s): 6-7 cups coffee with cream and splenda, juice, hot tea, very little water, soda 1x/week, beer or wine 1x/week, sweet tea only if out to eat (not often)   Exercise:  Exercise: ADL's  Individualized Plan for Diabetes Self-Management Training:   Learning Objective:  Patient will have a greater understanding of diabetes self-management.  Patient education plan per assessed needs and concerns is to attend individual sessions  prn   Education Topics Reviewed with Patient Today:  Definition of diabetes, type 1 and 2, and the diagnosis of diabetes, Factors that contribute to the development of diabetes Role of diet in the  treatment of diabetes and the relationship between the three main macronutrients and blood glucose level, Meal options for control of blood glucose level and chronic complications. Role of exercise on diabetes management, blood pressure control and cardiac health.   Identified appropriate SMBG and/or A1C goals. Taught treatment of hypoglycemia - the 15 rule., Discussed and identified patients' treatment of hyperglycemia. Relationship between chronic complications and blood glucose control        PATIENTS GOALS/Plan (Developed by the patient):  Nutrition: General guidelines for healthy choices and portions discussed  Plan:   There are no Patient Instructions on file for this visit.  Expected Outcomes:  Demonstrated interest in learning. Expect positive outcomes  Education material provided: Living Well with Diabetes, Meal plan card and Snack sheet  If problems or questions, patient to contact team via:  Phone  Future DSME appointment: 3-4 months

## 2014-06-12 ENCOUNTER — Telehealth (HOSPITAL_COMMUNITY): Payer: Self-pay | Admitting: Cardiac Rehabilitation

## 2014-06-12 DIAGNOSIS — R079 Chest pain, unspecified: Secondary | ICD-10-CM | POA: Diagnosis not present

## 2014-06-12 DIAGNOSIS — I2510 Atherosclerotic heart disease of native coronary artery without angina pectoris: Secondary | ICD-10-CM | POA: Diagnosis not present

## 2014-06-12 DIAGNOSIS — R0602 Shortness of breath: Secondary | ICD-10-CM | POA: Diagnosis not present

## 2014-06-12 DIAGNOSIS — I10 Essential (primary) hypertension: Secondary | ICD-10-CM | POA: Diagnosis not present

## 2014-06-12 NOTE — Telephone Encounter (Signed)
Multiple attempts to contact pt to enroll in cardiac rehab.   Phone not in service.  Letter mailed to pt home without response from patient.  Dr. Einar Gip made aware.

## 2014-06-19 DIAGNOSIS — M79672 Pain in left foot: Secondary | ICD-10-CM | POA: Diagnosis not present

## 2014-06-19 DIAGNOSIS — M79671 Pain in right foot: Secondary | ICD-10-CM | POA: Diagnosis not present

## 2014-06-19 DIAGNOSIS — B351 Tinea unguium: Secondary | ICD-10-CM | POA: Diagnosis not present

## 2014-06-26 DIAGNOSIS — I2510 Atherosclerotic heart disease of native coronary artery without angina pectoris: Secondary | ICD-10-CM | POA: Diagnosis not present

## 2014-06-26 DIAGNOSIS — R0609 Other forms of dyspnea: Secondary | ICD-10-CM | POA: Diagnosis not present

## 2014-07-09 DIAGNOSIS — R0789 Other chest pain: Secondary | ICD-10-CM | POA: Diagnosis not present

## 2014-07-09 DIAGNOSIS — M71322 Other bursal cyst, left elbow: Secondary | ICD-10-CM | POA: Diagnosis not present

## 2014-07-10 DIAGNOSIS — N3281 Overactive bladder: Secondary | ICD-10-CM | POA: Diagnosis not present

## 2014-07-10 DIAGNOSIS — N3941 Urge incontinence: Secondary | ICD-10-CM | POA: Diagnosis not present

## 2014-07-10 DIAGNOSIS — N401 Enlarged prostate with lower urinary tract symptoms: Secondary | ICD-10-CM | POA: Diagnosis not present

## 2014-07-10 DIAGNOSIS — E291 Testicular hypofunction: Secondary | ICD-10-CM | POA: Diagnosis not present

## 2014-07-10 DIAGNOSIS — N138 Other obstructive and reflux uropathy: Secondary | ICD-10-CM | POA: Diagnosis not present

## 2014-07-17 DIAGNOSIS — B351 Tinea unguium: Secondary | ICD-10-CM | POA: Diagnosis not present

## 2014-07-17 DIAGNOSIS — M79609 Pain in unspecified limb: Secondary | ICD-10-CM | POA: Diagnosis not present

## 2014-08-02 DIAGNOSIS — M7022 Olecranon bursitis, left elbow: Secondary | ICD-10-CM | POA: Diagnosis not present

## 2014-08-09 DIAGNOSIS — I10 Essential (primary) hypertension: Secondary | ICD-10-CM | POA: Diagnosis not present

## 2014-08-09 DIAGNOSIS — I25119 Atherosclerotic heart disease of native coronary artery with unspecified angina pectoris: Secondary | ICD-10-CM | POA: Diagnosis not present

## 2014-08-09 DIAGNOSIS — R609 Edema, unspecified: Secondary | ICD-10-CM | POA: Diagnosis not present

## 2014-08-09 DIAGNOSIS — R7309 Other abnormal glucose: Secondary | ICD-10-CM | POA: Diagnosis not present

## 2014-08-09 DIAGNOSIS — I129 Hypertensive chronic kidney disease with stage 1 through stage 4 chronic kidney disease, or unspecified chronic kidney disease: Secondary | ICD-10-CM | POA: Diagnosis not present

## 2014-08-09 DIAGNOSIS — M2550 Pain in unspecified joint: Secondary | ICD-10-CM | POA: Diagnosis not present

## 2014-08-09 DIAGNOSIS — E782 Mixed hyperlipidemia: Secondary | ICD-10-CM | POA: Diagnosis not present

## 2014-08-09 DIAGNOSIS — E669 Obesity, unspecified: Secondary | ICD-10-CM | POA: Diagnosis not present

## 2014-08-09 DIAGNOSIS — N182 Chronic kidney disease, stage 2 (mild): Secondary | ICD-10-CM | POA: Diagnosis not present

## 2014-08-14 DIAGNOSIS — M79672 Pain in left foot: Secondary | ICD-10-CM | POA: Diagnosis not present

## 2014-08-14 DIAGNOSIS — B351 Tinea unguium: Secondary | ICD-10-CM | POA: Diagnosis not present

## 2014-08-14 DIAGNOSIS — M79671 Pain in right foot: Secondary | ICD-10-CM | POA: Diagnosis not present

## 2014-08-16 DIAGNOSIS — M7022 Olecranon bursitis, left elbow: Secondary | ICD-10-CM | POA: Diagnosis not present

## 2014-08-21 DIAGNOSIS — N401 Enlarged prostate with lower urinary tract symptoms: Secondary | ICD-10-CM | POA: Diagnosis not present

## 2014-08-21 DIAGNOSIS — N138 Other obstructive and reflux uropathy: Secondary | ICD-10-CM | POA: Diagnosis not present

## 2014-08-27 ENCOUNTER — Other Ambulatory Visit: Payer: Self-pay | Admitting: Urology

## 2014-08-30 ENCOUNTER — Encounter: Payer: Medicare Other | Attending: Internal Medicine | Admitting: Dietician

## 2014-08-30 DIAGNOSIS — Z713 Dietary counseling and surveillance: Secondary | ICD-10-CM | POA: Insufficient documentation

## 2014-08-30 DIAGNOSIS — E119 Type 2 diabetes mellitus without complications: Secondary | ICD-10-CM | POA: Diagnosis not present

## 2014-08-30 NOTE — Progress Notes (Signed)
Diabetes Self-Management Education  Visit Type:  Follow Up  Appt. Start Time: 1330 Appt. End Time: 1400  08/30/2014  Christian [name], identified by name and date of birth, is a 69 y.o. male with a diagnosis of Diabetes.   ASSESSMENT  Patient reports he had begun an exercise routine of walking but is feeling some side effects of shortness of breath from medication and has been doing less walking than he was.  He was making changes to his diet but after his last doctor's visit and going on Metformin (reports tolerating well) he has been slipping back into past eating habits.  He is doing well with portion sizes and he is being mindful of how often he eats "junk food."  He is trying to increase his vegetable intake by flavoring with different sauces, etc and limiting the portions of his dressings/sauces.   He is taking his medications as prescribed.   He is hoping to restart physical activity and increase his exercise stamina as he did pass the stress test at his cardiologist and was approved for exercise.  Complications:   Last HgB A1C per patient/outside source: 6.9%  Diet Intake:   Breakfast: wheat bread with margarine and jelly, coffee with cream and splenda Snack (morning): none Lunch: skips or bolgna sandwich or fish stick sandwich on wheat bread, grapefruit juice Snack (afternoon): cheez-itz or popcorn or cheese and crackers or peanut butter and crackers (limits to 6 saltines) Dinner: steak, peas or black eyed peas, broccoli or asparagus, potato or rice and cornbread, grapefruit juice Snack (evening): 2 fig newton cookies or small piece of sheet cake and spiced hot tea blend sometimes with splenda Beverage(s): 5-6 cups coffee with cream and splenda, juice, hot tea, little water (been working on increasing), soda 1x/week, beer or wine 1x/week angry orchard or alcoholic root beer (cutting back due to price), sweet tea only if out to eat (not often - maybe 6 times a year)      Intervention:    Nutrition education and counseling.  Reviewed concepts for BG and weight mgmt through diet and lifestyle covered at last visit.  Recommended changes to his beverage intake: eliminate sodas and limit grapefruit juice to 1/2 cup portion per day.  Recommended he double check interaction of medications with grape fruit juice and/or ask his pharmacist.  Encouraged him to keep up the progress he has made increasing veggie intake, water intake, and walking for exercise.   Monitor/Evalaute: Diet, weight, labs and exercise prn.

## 2014-09-07 DIAGNOSIS — I129 Hypertensive chronic kidney disease with stage 1 through stage 4 chronic kidney disease, or unspecified chronic kidney disease: Secondary | ICD-10-CM | POA: Diagnosis not present

## 2014-09-11 DIAGNOSIS — I119 Hypertensive heart disease without heart failure: Secondary | ICD-10-CM | POA: Diagnosis not present

## 2014-09-11 DIAGNOSIS — E785 Hyperlipidemia, unspecified: Secondary | ICD-10-CM | POA: Diagnosis not present

## 2014-09-11 DIAGNOSIS — I2510 Atherosclerotic heart disease of native coronary artery without angina pectoris: Secondary | ICD-10-CM | POA: Diagnosis not present

## 2014-09-11 DIAGNOSIS — Z6832 Body mass index (BMI) 32.0-32.9, adult: Secondary | ICD-10-CM | POA: Diagnosis not present

## 2014-09-19 NOTE — Progress Notes (Signed)
Cath, ekg 11/15 epic

## 2014-09-19 NOTE — Patient Instructions (Addendum)
Your procedure is scheduled on:  09/27/14  THURSDAY  Report to [facility] at    0830   AM.   Call this number if you have problems the morning of surgery: [phone]        Do not eat food  Or drink :After Midnight.WEDNESDAY NIGHT   Take these medicines the morning of surgery with A SIP OF WATER:METOPROLOL//// MAY TAKE NITROGLYCERIN IF NEEDED DO NOT TAKE ANY DIABETIC MEDICATION Thursday MORNING   .  Contacts, dentures or partial plates, or metal hairpins  can not be worn to surgery. Your family will be responsible for glasses, dentures, hearing aides while you are in surgery  Leave suitcase in the car. After surgery it may be brought to your room.  For patients admitted to the hospital, checkout time is 11:00 AM day of  discharge.         [facility] IS NOT RESPONSIBLE FOR ANY VALUABLES  Patients discharged the day of surgery will not be allowed to drive home. IF going home the day of surgery, you must have a driver and someone to stay with you for the first 24 hours                                                                  [facility] - Preparing for Surgery Before surgery, you can play an important role.  Because skin is not sterile, your skin needs to be as free of germs as possible.  You can reduce the number of germs on your skin by washing with CHG (chlorahexidine gluconate) soap before surgery.  CHG is an antiseptic cleaner which kills germs and bonds with the skin to continue killing germs even after washing. Please DO NOT use if you have an allergy to CHG or antibacterial soaps.  If your skin becomes reddened/irritated stop using the CHG and inform your nurse when you arrive at Short Stay. Do not shave (including legs and underarms) for at least 48 hours prior to the first CHG shower.  You may shave your face/neck. Please follow these instructions carefully:  1.  Shower with CHG Soap the night  before surgery and the  morning of Surgery.  2.  If you choose to wash your hair, wash your hair first as usual with your  normal  shampoo.  3.  After you shampoo, rinse your hair and body thoroughly to remove the  shampoo.                           4.  Use CHG as you would any other liquid soap.  You can apply chg directly  to the skin and wash                       Gently with a scrungie or clean washcloth.  5.  Apply the CHG Soap to your body ONLY FROM THE NECK DOWN.   Do not use on face/ open                           Wound or  open sores. Avoid contact with eyes, ears mouth and genitals (private parts).                       Wash face,  Genitals (private parts) with your normal soap.             6.  Wash thoroughly, paying special attention to the area where your surgery  will be performed.  7.  Thoroughly rinse your body with warm water from the neck down.  8.  DO NOT shower/wash with your normal soap after using and rinsing off  the CHG Soap.                9.  Pat yourself dry with a clean towel.            10.  Wear clean pajamas.            11.  Place clean sheets on your bed the night of your first shower and do not  sleep with pets. Day of Surgery : Do not apply any lotions/deodorants the morning of surgery.  Please wear clean clothes to the hospital/surgery center.  FAILURE TO FOLLOW THESE INSTRUCTIONS MAY RESULT IN THE CANCELLATION OF YOUR SURGERY PATIENT SIGNATURE_________________________________  NURSE SIGNATURE__________________________________  ________________________________________________________________________

## 2014-09-20 ENCOUNTER — Encounter (HOSPITAL_COMMUNITY)
Admission: RE | Admit: 2014-09-20 | Discharge: 2014-09-20 | Disposition: A | Discharge status: Home / Self Care | Payer: Medicare Other | Source: Ambulatory Visit | Attending: Urology | Admitting: Urology

## 2014-09-20 ENCOUNTER — Encounter (HOSPITAL_COMMUNITY): Payer: Self-pay

## 2014-09-20 DIAGNOSIS — Z01818 Encounter for other preprocedural examination: Secondary | ICD-10-CM | POA: Insufficient documentation

## 2014-09-20 LAB — BASIC METABOLIC PANEL
Anion gap: 12 (ref 5–15)
BUN: 19 mg/dL (ref 6–20)
CO2: 28 mmol/L (ref 22–32)
Calcium: 9.8 mg/dL (ref 8.9–10.3)
Chloride: 103 mmol/L (ref 101–111)
Creatinine, Ser: 1.32 mg/dL — ABNORMAL HIGH (ref 0.61–1.24)
GFR calc Af Amer: >60 mL/min (ref >60)
GFR calc non Af Amer: 54 mL/min — ABNORMAL LOW (ref >60)
Glucose, Bld: 104 mg/dL — ABNORMAL HIGH (ref 65–99)
Potassium: 3.8 mmol/L (ref 3.5–5.1)
Sodium: 143 mmol/L (ref 135–145)

## 2014-09-20 LAB — CBC
HCT: 41 % (ref 39.0–52.0)
Hemoglobin: 13.4 g/dL (ref 13.0–17.0)
MCH: 33.3 pg (ref 26.0–34.0)
MCHC: 32.7 g/dL (ref 30.0–36.0)
MCV: 102 fL — ABNORMAL HIGH (ref 78.0–100.0)
Platelets: 203 10*3/uL (ref 150–400)
RBC: 4.02 MIL/uL — ABNORMAL LOW (ref 4.22–5.81)
RDW: 13 % (ref 11.5–15.5)
WBC: 8.3 10*3/uL (ref 4.0–10.5)

## 2014-09-20 NOTE — Progress Notes (Signed)
Faxed bmet to dr Gaynelle Arabian via epic

## 2014-09-20 NOTE — Progress Notes (Signed)
PAT VISIT- late entry-  Spoke with Pam at [facility] regarding Plavix.  Patient states has not been instructed.  Pt informed via telephone per Pam to stop plavix 7 days pre op.  Pt states Dr Einar Gip aware of surgery.  Requested lov and any recent studies from Dr Einar Gip office

## 2014-09-20 NOTE — Progress Notes (Signed)
   09/20/14 1112  OBSTRUCTIVE SLEEP APNEA  Have you ever been diagnosed with sleep apnea through a sleep study? No  Do you snore loudly (loud enough to be heard through closed doors)?  0  Do you often feel tired, fatigued, or sleepy during the daytime? 0  Has anyone observed you stop breathing during your sleep? 0  Do you have, or are you being treated for high blood pressure? 1  BMI more than 35 kg/m2? 0  Age over 68 years old? 1  Neck circumference greater than 40 cm/16 inches? 1  Gender: 1

## 2014-09-25 NOTE — Progress Notes (Signed)
lov dr vyas 5/16 chart eccho 3/16 chart ekg 2/16 chart Stress 6/14 chart

## 2014-09-27 ENCOUNTER — Ambulatory Visit (HOSPITAL_COMMUNITY): Payer: Medicare Other | Admitting: Anesthesiology

## 2014-09-27 ENCOUNTER — Encounter (HOSPITAL_COMMUNITY): Payer: Self-pay | Admitting: *Deleted

## 2014-09-27 ENCOUNTER — Encounter (HOSPITAL_COMMUNITY)
Admission: RE | Disposition: A | Discharge status: Home / Self Care | Payer: Self-pay | Source: Ambulatory Visit | Attending: Urology

## 2014-09-27 ENCOUNTER — Inpatient Hospital Stay (HOSPITAL_COMMUNITY)
Admission: RE | Admit: 2014-09-27 | Discharge: 2014-10-01 | DRG: 713 | Disposition: A | Discharge status: Home / Self Care | Payer: Medicare Other | Source: Ambulatory Visit | Attending: Urology | Admitting: Urology

## 2014-09-27 DIAGNOSIS — T39015A Adverse effect of aspirin, initial encounter: Secondary | ICD-10-CM | POA: Diagnosis not present

## 2014-09-27 DIAGNOSIS — Z79899 Other long term (current) drug therapy: Secondary | ICD-10-CM

## 2014-09-27 DIAGNOSIS — N529 Male erectile dysfunction, unspecified: Secondary | ICD-10-CM | POA: Diagnosis present

## 2014-09-27 DIAGNOSIS — N400 Enlarged prostate without lower urinary tract symptoms: Secondary | ICD-10-CM | POA: Diagnosis present

## 2014-09-27 DIAGNOSIS — Z9079 Acquired absence of other genital organ(s): Secondary | ICD-10-CM | POA: Diagnosis present

## 2014-09-27 DIAGNOSIS — Z791 Long term (current) use of non-steroidal anti-inflammatories (NSAID): Secondary | ICD-10-CM

## 2014-09-27 DIAGNOSIS — Z955 Presence of coronary angioplasty implant and graft: Secondary | ICD-10-CM

## 2014-09-27 DIAGNOSIS — N3946 Mixed incontinence: Secondary | ICD-10-CM | POA: Diagnosis present

## 2014-09-27 DIAGNOSIS — N3281 Overactive bladder: Secondary | ICD-10-CM | POA: Diagnosis not present

## 2014-09-27 DIAGNOSIS — Z6831 Body mass index (BMI) 31.0-31.9, adult: Secondary | ICD-10-CM

## 2014-09-27 DIAGNOSIS — E119 Type 2 diabetes mellitus without complications: Secondary | ICD-10-CM | POA: Diagnosis present

## 2014-09-27 DIAGNOSIS — K219 Gastro-esophageal reflux disease without esophagitis: Secondary | ICD-10-CM | POA: Diagnosis present

## 2014-09-27 DIAGNOSIS — N99820 Postprocedural hemorrhage and hematoma of a genitourinary system organ or structure following a genitourinary system procedure: Secondary | ICD-10-CM | POA: Diagnosis not present

## 2014-09-27 DIAGNOSIS — Z7952 Long term (current) use of systemic steroids: Secondary | ICD-10-CM

## 2014-09-27 DIAGNOSIS — N138 Other obstructive and reflux uropathy: Secondary | ICD-10-CM | POA: Diagnosis present

## 2014-09-27 DIAGNOSIS — Y848 Other medical procedures as the cause of abnormal reaction of the patient, or of later complication, without mention of misadventure at the time of the procedure: Secondary | ICD-10-CM | POA: Diagnosis not present

## 2014-09-27 DIAGNOSIS — E559 Vitamin D deficiency, unspecified: Secondary | ICD-10-CM | POA: Diagnosis present

## 2014-09-27 DIAGNOSIS — Z833 Family history of diabetes mellitus: Secondary | ICD-10-CM

## 2014-09-27 DIAGNOSIS — Z01812 Encounter for preprocedural laboratory examination: Secondary | ICD-10-CM

## 2014-09-27 DIAGNOSIS — Z7982 Long term (current) use of aspirin: Secondary | ICD-10-CM

## 2014-09-27 DIAGNOSIS — E669 Obesity, unspecified: Secondary | ICD-10-CM | POA: Diagnosis present

## 2014-09-27 DIAGNOSIS — N401 Enlarged prostate with lower urinary tract symptoms: Secondary | ICD-10-CM | POA: Diagnosis not present

## 2014-09-27 DIAGNOSIS — E291 Testicular hypofunction: Secondary | ICD-10-CM | POA: Diagnosis present

## 2014-09-27 DIAGNOSIS — D691 Qualitative platelet defects: Secondary | ICD-10-CM | POA: Diagnosis not present

## 2014-09-27 DIAGNOSIS — Z8249 Family history of ischemic heart disease and other diseases of the circulatory system: Secondary | ICD-10-CM

## 2014-09-27 DIAGNOSIS — I10 Essential (primary) hypertension: Secondary | ICD-10-CM | POA: Diagnosis present

## 2014-09-27 DIAGNOSIS — Z8042 Family history of malignant neoplasm of prostate: Secondary | ICD-10-CM

## 2014-09-27 DIAGNOSIS — Z87891 Personal history of nicotine dependence: Secondary | ICD-10-CM

## 2014-09-27 DIAGNOSIS — I2510 Atherosclerotic heart disease of native coronary artery without angina pectoris: Secondary | ICD-10-CM | POA: Diagnosis present

## 2014-09-27 HISTORY — DX: Benign prostatic hyperplasia without lower urinary tract symptoms: N40.0

## 2014-09-27 HISTORY — PX: TRANSURETHRAL RESECTION OF PROSTATE: SHX73

## 2014-09-27 LAB — GLUCOSE, CAPILLARY
Glucose-Capillary: 93 mg/dL (ref 65–99)
Glucose-Capillary: 82 mg/dL (ref 65–99)

## 2014-09-27 SURGERY — TRANSURETHRAL RESECTION OF THE PROSTATE WITH GYRUS INSTRUMENTS
Anesthesia: General | Site: Prostate

## 2014-09-27 MED ORDER — ASPIRIN 81 MG PO CHEW
324.0000 mg | CHEWABLE_TABLET | Freq: Once | ORAL | Status: AC
  Administered 2014-09-27: 324 mg via ORAL
  Filled 2014-09-27: qty 4

## 2014-09-27 MED ORDER — LACTATED RINGERS IV SOLN
INTRAVENOUS | Status: AC
  Administered 2014-09-27: 1000 mL via INTRAVENOUS

## 2014-09-27 MED ORDER — BELLADONNA ALKALOIDS-OPIUM 16.2-60 MG RE SUPP
RECTAL | Status: AC
  Filled 2014-09-27: qty 1

## 2014-09-27 MED ORDER — LIDOCAINE HCL (CARDIAC) 20 MG/ML IV SOLN
INTRAVENOUS | Status: DC | PRN
  Administered 2014-09-27: 50 mg via INTRAVENOUS

## 2014-09-27 MED ORDER — FENTANYL CITRATE (PF) 100 MCG/2ML IJ SOLN
INTRAMUSCULAR | Status: DC | PRN
  Administered 2014-09-27 (×2): 25 ug via INTRAVENOUS

## 2014-09-27 MED ORDER — CEFAZOLIN SODIUM-DEXTROSE 2-3 GM-% IV SOLR
2.0000 g | INTRAVENOUS | Status: AC
  Administered 2014-09-27: 2 g via INTRAVENOUS

## 2014-09-27 MED ORDER — OXYCODONE-ACETAMINOPHEN 5-325 MG PO TABS
1.0000 | ORAL_TABLET | ORAL | Status: DC | PRN
  Administered 2014-09-28 – 2014-09-30 (×4): 2 via ORAL
  Administered 2014-09-30: 1 via ORAL
  Administered 2014-10-01 (×2): 2 via ORAL
  Filled 2014-09-27 (×2): qty 2
  Filled 2014-09-27: qty 1
  Filled 2014-09-27 (×4): qty 2

## 2014-09-27 MED ORDER — PROPOFOL 10 MG/ML IV BOLUS
INTRAVENOUS | Status: DC | PRN
  Administered 2014-09-27: 150 mg via INTRAVENOUS

## 2014-09-27 MED ORDER — ONDANSETRON HCL 4 MG/2ML IJ SOLN
4.0000 mg | INTRAMUSCULAR | Status: DC | PRN
Start: 2014-09-27 — End: 2014-10-01
  Administered 2014-09-30: 4 mg via INTRAVENOUS
  Filled 2014-09-27: qty 2

## 2014-09-27 MED ORDER — BACITRACIN-NEOMYCIN-POLYMYXIN 400-5-5000 EX OINT: 1.0000 "application " | TOPICAL_OINTMENT | Freq: Three times a day (TID) | CUTANEOUS | Status: DC | PRN

## 2014-09-27 MED ORDER — FENTANYL CITRATE (PF) 100 MCG/2ML IJ SOLN: 25.0000 ug | INTRAMUSCULAR | Status: DC | PRN

## 2014-09-27 MED ORDER — PROPOFOL 10 MG/ML IV BOLUS
INTRAVENOUS | Status: AC
  Filled 2014-09-27: qty 20

## 2014-09-27 MED ORDER — SENNOSIDES-DOCUSATE SODIUM 8.6-50 MG PO TABS
2.0000 | ORAL_TABLET | Freq: Every day | ORAL | Status: DC
  Administered 2014-09-27 – 2014-09-30 (×4): 2 via ORAL
  Filled 2014-09-27 (×4): qty 2

## 2014-09-27 MED ORDER — SODIUM CHLORIDE 0.9 % IR SOLN
Status: DC | PRN
  Administered 2014-09-27: 13:00:00
  Administered 2014-09-27 (×2): 3000 mL

## 2014-09-27 MED ORDER — DIPHENHYDRAMINE HCL 50 MG/ML IJ SOLN
12.5000 mg | Freq: Four times a day (QID) | INTRAMUSCULAR | Status: DC | PRN
  Administered 2014-09-30: 12.5 mg via INTRAVENOUS
  Filled 2014-09-27: qty 1

## 2014-09-27 MED ORDER — ACETAMINOPHEN 325 MG PO TABS: 650.0000 mg | ORAL_TABLET | ORAL | Status: DC | PRN

## 2014-09-27 MED ORDER — CIPROFLOXACIN HCL 500 MG PO TABS
500.0000 mg | ORAL_TABLET | Freq: Two times a day (BID) | ORAL | Status: DC
  Administered 2014-09-27 – 2014-10-01 (×8): 500 mg via ORAL
  Filled 2014-09-27 (×9): qty 1

## 2014-09-27 MED ORDER — SODIUM CHLORIDE 0.45 % IV SOLN
INTRAVENOUS | Status: DC
  Administered 2014-09-27 – 2014-10-01 (×5): via INTRAVENOUS

## 2014-09-27 MED ORDER — DIPHENHYDRAMINE HCL 12.5 MG/5ML PO ELIX: 12.5000 mg | ORAL_SOLUTION | Freq: Four times a day (QID) | ORAL | Status: DC | PRN

## 2014-09-27 MED ORDER — HYDROMORPHONE HCL 1 MG/ML IJ SOLN
0.5000 mg | INTRAMUSCULAR | Status: DC | PRN
  Administered 2014-09-28 – 2014-10-01 (×17): 1 mg via INTRAVENOUS
  Filled 2014-09-27 (×17): qty 1

## 2014-09-27 MED ORDER — CEFAZOLIN SODIUM-DEXTROSE 2-3 GM-% IV SOLR
INTRAVENOUS | Status: AC
  Filled 2014-09-27: qty 50

## 2014-09-27 MED ORDER — PROMETHAZINE HCL 25 MG/ML IJ SOLN: 6.2500 mg | INTRAMUSCULAR | Status: DC | PRN

## 2014-09-27 MED ORDER — BELLADONNA ALKALOIDS-OPIUM 16.2-60 MG RE SUPP
RECTAL | Status: DC | PRN
  Administered 2014-09-27: 1 via RECTAL

## 2014-09-27 MED ORDER — ONDANSETRON HCL 4 MG/2ML IJ SOLN
INTRAMUSCULAR | Status: DC | PRN
  Administered 2014-09-27: 4 mg via INTRAVENOUS

## 2014-09-27 MED ORDER — METOPROLOL SUCCINATE ER 100 MG PO TB24
100.0000 mg | ORAL_TABLET | Freq: Every day | ORAL | Status: DC
  Administered 2014-09-27: 100 mg via ORAL
  Filled 2014-09-27: qty 1

## 2014-09-27 MED ORDER — FENTANYL CITRATE (PF) 100 MCG/2ML IJ SOLN
INTRAMUSCULAR | Status: AC
  Filled 2014-09-27: qty 2

## 2014-09-27 SURGICAL SUPPLY — 16 items
BAG URINE DRAINAGE (UROLOGICAL SUPPLIES) ×2 IMPLANT
BAG URO CATCHER STRL LF (DRAPE) ×2 IMPLANT
CATH HEMA 3WAY 30CC 22FR COUDE (CATHETERS) ×2 IMPLANT
GLOVE BIOGEL M STRL SZ7.5 (GLOVE) ×2 IMPLANT
GLOVE BIOGEL PI IND STRL 7.0 (GLOVE) ×2 IMPLANT
GLOVE BIOGEL PI INDICATOR 7.0 (GLOVE) ×2
GOWN STRL REUS W/TWL XL LVL3 (GOWN DISPOSABLE) ×6 IMPLANT
HOLDER FOLEY CATH W/STRAP (MISCELLANEOUS) ×2 IMPLANT
IV NS IRRIG 3000ML ARTHROMATIC (IV SOLUTION) ×6 IMPLANT
LOOP CUT BIPOLAR 24F LRG (ELECTROSURGICAL) ×2 IMPLANT
MANIFOLD NEPTUNE II (INSTRUMENTS) ×2 IMPLANT
NS IRRIG 1000ML POUR BTL (IV SOLUTION) ×2 IMPLANT
PACK CYSTO (CUSTOM PROCEDURE TRAY) ×2 IMPLANT
SYR 30ML LL (SYRINGE) ×2 IMPLANT
SYRINGE IRR TOOMEY STRL 70CC (SYRINGE) ×2 IMPLANT
TUBING CONNECTING 10 (TUBING) ×2 IMPLANT

## 2014-09-27 NOTE — Op Note (Signed)
Pre-operative diagnosis : BPH  Postoperative diagnosis:  Same  Operation:  Cystoscopy, TURP  Surgeon:  S. Gaynelle Arabian, MD  First assistant:  None  Anesthesia:  General, LMA  Preparation:  Appropriate preanesthesia, the patient was brought the operating room, placed preoperatively the dorsal supine position where general anesthesia was introduced. It was replaced in the dorsal lithotomy position with recent injury usual fashion. The armband was double checked. The history was double checked.  Review history:     69 YO male returns today for a cystoscopy & flowrate for hx of OAB failing anticholinergics, ? candidate for Botox. Newly diagnosed diabetic. He has failed Myrbetriq 50mg  & Toviaz 8mg . Hx of BPH associated with LUTS. Nocturia is now 1/night vs 2-3. S/P gyrus TURP on 04/04/12 for his symptoms. He developed post op incontinence. This nearly resolved with PT and Toviaz, but then started failing meds.     07/10/14 PSA - 0.51  07/06/11 PSA - 0.91   Past Medical History  Statement of  Likelihood of Success: Excellent. TIME-OUT observed.:  Procedure:  Cystoscopy showed normal-appearing urethra. The prostatic fossa showed obstructing left lateral lobe of the prostate, and this was photo documented. The left lobe of the prostate is resected from the liver o'clock to 7:00 positions. Minimal bleeding is noted. Bleeding is controlled with the electrosurgical unit. Chips were evacuated from the bladder. Suspect 22 hematuria catheter was placed to traction and continuous irrigation. The patient was awakened, and taken to recovery room in good condition. Chips were sent to laboratory for evaluation.

## 2014-09-27 NOTE — Anesthesia Preprocedure Evaluation (Addendum)
Anesthesia Evaluation  Patient identified by MRN, date of birth, ID band Patient awake    Reviewed: Allergy & Precautions, NPO status , Patient's Chart, lab work & pertinent test results  Airway Mallampati: II  TM Distance: >3 FB Neck ROM: Full    Dental no notable dental hx.    Pulmonary shortness of breath, former smoker,  breath sounds clear to auscultation  Pulmonary exam normal       Cardiovascular hypertension, Pt. on medications + angina + CAD Normal cardiovascular examRhythm:Regular Rate:Normal     Neuro/Psych  Neuromuscular disease negative psych ROS   GI/Hepatic Neg liver ROS, hiatal hernia,   Endo/Other  negative endocrine ROSdiabetes  Renal/GU negative Renal ROS  negative genitourinary   Musculoskeletal negative musculoskeletal ROS (+)   Abdominal (+) + obese,   Peds negative pediatric ROS (+)  Hematology negative hematology ROS (+)   Anesthesia Other Findings   Reproductive/Obstetrics negative OB ROS                            Anesthesia Physical Anesthesia Plan  ASA: III  Anesthesia Plan: General   Post-op Pain Management:    Induction: Intravenous  Airway Management Planned: LMA  Additional Equipment:   Intra-op Plan:   Post-operative Plan: Extubation in OR  Informed Consent: I have reviewed the patients History and Physical, chart, labs and discussed the procedure including the risks, benefits and alternatives for the proposed anesthesia with the patient or authorized representative who has indicated his/her understanding and acceptance.   Dental advisory given  Plan Discussed with: CRNA  Anesthesia Plan Comments: (DES stent to LDA 02-27-14. Mr. Danos stopped his plavix and ASA one week ago. Called and spoke with Dr. Einar Gip (Dr. Woody Seller unavailable) and Dr. Einar Gip believes safe to proceed today and to give ASA 324 mg PO now and restart Plavix when possible.  Discussed with Dr. Gaynelle Arabian and he agrees to plan. Discussed with patients.)       Anesthesia Quick Evaluation

## 2014-09-27 NOTE — Interval H&P Note (Signed)
History and Physical Interval Note:  09/27/2014 10:59 AM  Christian [name]  has presented today for surgery, with the diagnosis of BPH  The various methods of treatment have been discussed with the patient and family. After consideration of risks, benefits and other options for treatment, the patient has consented to  Procedure(s): TRANSURETHRAL RESECTION OF THE PROSTATE WITH GYRUS INSTRUMENTS (N/A) as a surgical intervention .  The patient's history has been reviewed, patient examined, no change in status, stable for surgery.  I have reviewed the patient's chart and labs.  Questions were answered to the patient's satisfaction.     Tc Kapusta I Hyacinth Marcelli

## 2014-09-27 NOTE — H&P (Signed)
Reason For Visit Cystoscopy, flowrate   Active Problems Problems  1. Benign prostatic hyperplasia with urinary obstruction (N40.1,N13.8)   Assessed By: Carolan Clines (Urology); Last Assessed: 21 Aug 2014 2. Erectile dysfunction (N52.9)   Assessed By: Carolan Clines (Urology); Last Assessed: 29 May 2014 3. 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 Hypogonadism, testicular (E29.1) 5. 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 Overactive bladder (N32.81)   Assessed By: Carolan Clines (Urology); Last Assessed: 10 Jul 2014 7. 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 Vitamin D deficiency (E55.9)   Assessed By: Carolan Clines (Urology); Last Assessed: 01 Jan 2009  History of Present Illness     69 YO male returns today for a cystoscopy & flowrate for hx of OAB failing anticholinergics, ? candidate for Botox. Newly diagnosed diabetic. He has failed Myrbetriq 50mg  & Toviaz 8mg . Hx of BPH associated with LUTS. Nocturia is now 1/night vs 2-3. S/P gyrus TURP on 04/04/12 for his symptoms. He developed post op incontinence. This nearly resolved with PT and Toviaz, but then started failing meds.      07/10/14 PSA - 0.51  07/06/11 PSA - 0.91   Past Medical History Problems  1. History of CAD (coronary artery disease) (I25.10) 2. History of esophageal reflux (Z87.19) 3. History of esophageal reflux (Z87.19) 4. History of heartburn (Z87.898) 5. History of hypertension (Z86.79) 6. 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 History of Cath Stent Placement 2. History of Heart Surgery 3. History of Surgery Testis 4. History of Transurethral Resection Of Prostate (TURP)  Current Meds 1. Advil TABS;  Therapy: (Recorded:04Dec2007) to Recorded 2. Aspirin 81  MG Oral Tablet Chewable;  Therapy: (Recorded:10Nov2015) to Recorded 3. Atorvastatin Calcium 20 MG Oral Tablet;  Therapy: [phone] to Recorded 4. Cay[place] CAPS;  Therapy: (Recorded:27Mar2012) to Recorded 5. Ciclopirox 0.77 % External Gel;  Therapy: 22Apr2015 to Recorded 6. Cod Liver OIL;  Therapy: (Recorded:09Dec2008) to Recorded 7. Diltiazem HCl ER Beads 120 MG Oral Capsule Extended Release 24  Hour;  Therapy: (Recorded:10Nov2015) to Recorded 8. Diovan HCT 160-25 MG Oral Tablet;  Therapy: [id number] to Recorded 9. Garlic CAPS;  Therapy: (Recorded:09Dec2008) to Recorded 10. Ketorolac Tromethamine 0.4 % Ophthalmic Solution;   Therapy: 09Apr2013 to Recorded 11. MetFORMIN HCl TABS;   Therapy: (Recorded:26Apr2016) to Recorded 12. Metoprolol Succinate ER 100 MG Oral Tablet Extended Release 24 Hour;   Therapy: [phone] to Recorded 13. Multi-Vitamin Oral Tablet;   Therapy: (Recorded:04Dec2007) to Recorded 14. Myrbetriq 50 MG Oral Tablet Extended Release 24 Hour; Take 1 tablet   every day;   Therapy: [id number] to (Evaluate:31Jul2016); Last Rx:02Feb2016   Ordered 15. Myrbetriq 50 MG Oral Tablet Extended Release 24 Hour; Take 1 tablet   twice daily;   Therapy: [id number] to (Last Rx:08Dec2015)  Requested for: [id number]   Ordered 16. Naftin 2 % External Cream;   Therapy: (Recorded:02Feb2016) to Recorded 17. Nitrostat 0.4 MG Sublingual Tablet Sublingual;   Therapy: [id number] to Recorded 18. PrednisoLONE Acetate 1 % Ophthalmic Suspension;   Therapy: [id number] to Recorded 19. Terbinafine HCl - 250 MG Oral Tablet;   Therapy: (Recorded:02Feb2016) to Recorded 20. Valsartan-Hydrochlorothiazide 320-25 MG Oral Tablet;   Therapy: 07Oct2014 to Recorded 21. Vitamin B-12 1000 MCG/ML SOLN;   Therapy: (  Recorded:09Dec2008) to Recorded 22. Vitamin C w/Rose Hips 1000 MG TABS;   Therapy: (Recorded:09Dec2008) to Recorded 23. Vitamin D TABS;   Therapy: (Recorded:04Dec2007) to Recorded 24. Vitamin E  400 UNIT Oral Capsule;   Therapy: (Recorded:09Dec2008) to Recorded  Allergies Medication  1. No Known Drug Allergies  Family History Problems  1. Family history of Diabetes Mellitus : Mother 2. Family history of Diabetes Mellitus : Father 3. Family history of Family Health Status - Father's Age   49 4. Family history of Family Health Status Number Of Children   2 sons 6. Family history of Hypertension : Father 44. Family history of Hypertension : Mother 83. Family history of Mother Deceased At Age ____   66 8. Family history of Prostate Cancer : Father  Social History Problems  1. Activities Of Daily Living 2. Denied: History of Alcohol Use 3. Caffeine Use   5-6/day 4. Family history of Death In The Family Mother   deceased age 63 5. Exercise Habits   Regular exercise including [place] Bouchard on a regular basis and also thi chi. 6. Former smoker [phone])   quit smoking in 1992 7. Living Independently With Spouse 8. Occupation:   Librarian, academic 9. Self-reliant In Usual Daily Activities 10. History of Tobacco Use   Quit; smoked for 31yrs 1.5ppd  Review of Systems Genitourinary, constitutional, skin, eye, otolaryngeal, hematologic/lymphatic, cardiovascular, pulmonary, endocrine, musculoskeletal, gastrointestinal, neurological and psychiatric system(s) were reviewed and pertinent findings if present are noted and are otherwise negative.  Genitourinary: incontinence.  Cardiovascular: leg swelling.  Musculoskeletal: joint swelling.    Vitals Vital Signs [Data Includes: Last 1 Day]  Recorded: 26Apr2016 03:00PM  Blood Pressure: 166 / 97 Temperature: 97.7 F Heart Rate: 97  Physical Exam Constitutional: Well nourished and well developed . No acute distress.  ENT:. The ears and nose are normal in appearance.  Neck: The appearance of the neck is normal and no neck mass is present.  Pulmonary: No respiratory distress and normal respiratory rhythm and effort.  Cardiovascular:  Heart rate and rhythm are normal . No peripheral edema.  Abdomen: The abdomen is soft and nontender. No masses are palpated. No CVA tenderness. No hernias are palpable. No hepatosplenomegaly noted.  Rectal: Rectal exam demonstrates normal sphincter tone, no tenderness and no masses. Estimated prostate size is 3+. Normal rectal tone, no rectal masses, prostate is smooth, symmetric and non-tender. The prostate has no nodularity and is not tender. The left seminal vesicle is nonpalpable. The right seminal vesicle is nonpalpable. The perineum is normal on inspection.  Genitourinary: Examination of the penis demonstrates no discharge, no masses, no lesions and a normal meatus. The scrotum is without lesions. The right epididymis is palpably normal and non-tender. The left epididymis is palpably normal and non-tender. The right testis is non-tender and without masses. The left testis is non-tender and without masses.  Lymphatics: The femoral and inguinal nodes are not enlarged or tender.  Skin: Normal skin turgor, no visible rash and no visible skin lesions.  Neuro/Psych:. Mood and affect are appropriate.    Results/Data Urine [Data Includes: Last 1 Day]   26Apr2016  COLOR YELLOW   APPEARANCE CLEAR   SPECIFIC GRAVITY 1.025   pH 5.5   GLUCOSE NEG mg/dL  BILIRUBIN NEG   KETONE TRACE mg/dL  BLOOD NEG   PROTEIN NEG mg/dL  UROBILINOGEN 0.2 mg/dL  NITRITE NEG   LEUKOCYTE ESTERASE NEG    Flow Rate: Voided 275 ml. A peak flow rate of 6ml/s and mean flow rate  of 65ml/s.    Procedure  Procedure: Cystoscopy  Chaperone Present: kim lewis.  Indication: Lower Urinary Tract Symptoms.  Informed Consent: Risks, benefits, and potential adverse events were discussed and informed consent was obtained from the patient.  Prep: The patient was prepped with betadine.  Anesthesia:. Local anesthesia was administered intraurethrally with 2% lidocaine jelly.  Antibiotic prophylaxis: Ciprofloxacin.  Procedure Note:   Urethral meatus:. No abnormalities.  Anterior urethra: No abnormalities.  Prostatic urethra:. There was visual obstruction of the prostatic urethra. The lateral prostatic lobes were enlarged. An enlarged intravesical median lobe was visualized.  Bladder: The ureteral orifices were in the normal anatomic position bilaterally. A systematic survey of the bladder demonstrated no bladder tumors or stones. The patient tolerated the procedure well.  Complications: None.    Assessment Assessed  1. Benign prostatic hyperplasia with urinary obstruction (N40.1,N13.8)  Bladder outlet obstructed 2ndary to median lobe ( ball-valve effect) and L lateral lobe of the prostate. This will need resection. Discussed with patient.   Plan Health Maintenance  1. UA With REFLEX; [Do Not Release]; Status:Resulted - Requires  Verification;   Done: 26Apr2016 02:42PM  TURP/L lateral lobe and L-sided median lobe.   Signatures Electronically signed by : Carolan Clines, M.D.; Aug 21 2014  4:13PM EST

## 2014-09-27 NOTE — Transfer of Care (Signed)
Immediate Anesthesia Transfer of Care Note  Patient: Christian [name]  Procedure(s) Performed: Procedure(s): TRANSURETHRAL RESECTION OF THE PROSTATE  (N/A)  Patient Location: PACU  Anesthesia Type:General  Level of Consciousness:  sedated, patient cooperative and responds to stimulation  Airway & Oxygen Therapy:Patient Spontanous Breathing and Patient connected to face mask oxgen  Post-op Assessment:  Report given to PACU RN and Post -op Vital signs reviewed and stable  Post vital signs:  Reviewed and stable  Last Vitals:  Filed Vitals:   09/27/14 0828  BP: 149/80  Pulse: 85  Temp: 36.5 C  Resp: 18    Complications: No apparent anesthesia complications

## 2014-09-27 NOTE — Anesthesia Postprocedure Evaluation (Signed)
  Anesthesia Post-op Note  Patient: Christian [name]  Procedure(s) Performed: Procedure(s) (LRB): TRANSURETHRAL RESECTION OF THE PROSTATE  (N/A)  Patient Location: PACU  Anesthesia Type: General  Level of Consciousness: awake and alert   Airway and Oxygen Therapy: Patient Spontanous Breathing  Post-op Pain: mild  Post-op Assessment: Post-op Vital signs reviewed, Patient's Cardiovascular Status Stable, Respiratory Function Stable, Patent Airway and No signs of Nausea or vomiting  Last Vitals:  Filed Vitals:   09/27/14 1314  BP: 144/86  Pulse: 69  Temp: 36.4 C  Resp: 16    Post-op Vital Signs: stable   Complications: No apparent anesthesia complications

## 2014-09-27 NOTE — Progress Notes (Signed)
RN attempted to empty patient's foley drainage bag and noted that CBI had stopped running.  Urine was dark red. Patient stated he was not in any discomfort.  After checking for kinks in the tubing, I hand irrigated foley x2 and pulled of a very large string clot.  CBI began to run and urine returned to pale red with no signs of clots present.  Foley drainage bag had to be exchanged because of large amounts of clots present in the bag prohibiting it to be emptied.  Will continue to monitor CBI and output.  Educated patient on informing RN if he felt any lower abdominal discomfort.

## 2014-09-27 NOTE — Anesthesia Procedure Notes (Signed)
Procedure Name: LMA Insertion Date/Time: 09/27/2014 11:32 AM Performed by: Anne Fu Pre-anesthesia Checklist: Patient identified, Emergency Drugs available, Suction available, Patient being monitored and Timeout performed Patient Re-evaluated:Patient Re-evaluated prior to inductionOxygen Delivery Method: Circle system utilized Preoxygenation: Pre-oxygenation with 100% oxygen Intubation Type: IV induction Ventilation: Mask ventilation without difficulty LMA: LMA inserted LMA Size: 4.0 Number of attempts: 1 Placement Confirmation: positive ETCO2 and breath sounds checked- equal and bilateral Tube secured with: Tape

## 2014-09-28 ENCOUNTER — Encounter (HOSPITAL_COMMUNITY): Payer: Self-pay | Admitting: Urology

## 2014-09-28 MED ORDER — OXYBUTYNIN CHLORIDE 5 MG PO TABS
5.0000 mg | ORAL_TABLET | Freq: Four times a day (QID) | ORAL | Status: DC | PRN
  Administered 2014-09-28 – 2014-10-01 (×4): 5 mg via ORAL
  Filled 2014-09-28 (×5): qty 1

## 2014-09-28 MED ORDER — KETOROLAC TROMETHAMINE 15 MG/ML IJ SOLN
15.0000 mg | Freq: Four times a day (QID) | INTRAMUSCULAR | Status: DC | PRN
  Administered 2014-09-28: 15 mg via INTRAVENOUS
  Filled 2014-09-28: qty 1

## 2014-09-28 MED ORDER — BELLADONNA ALKALOIDS-OPIUM 16.2-60 MG RE SUPP
1.0000 | Freq: Four times a day (QID) | RECTAL | Status: DC | PRN
  Administered 2014-09-28 – 2014-10-01 (×5): 1 via RECTAL
  Filled 2014-09-28 (×5): qty 1

## 2014-09-28 NOTE — Progress Notes (Signed)
Pt C/O low abdominal pain and felt the urge to urinated. Irrigated patient's bladder over 59mins, was able to get a few clots out. Pt says he feels a lot better and there is no pressure. Will continue to monitor pt.

## 2014-09-28 NOTE — Progress Notes (Signed)
Urology Progress Note  1 Day Post-Op   Subjective: TURP yesterday, Pt given ASA pre-op per Cardiology. Did well intra -op, but now having post op bleeding. Traction, CBI, and bladder irrigation.     No acute urologic events overnight. Ambulation:   negative Flatus:    positive Bowel movement  negative  Pain: no relief. Spasms  Objective:  Blood pressure 117/74, pulse 86, temperature 97.8 F (36.6 C), temperature source Oral, resp. rate 18, height 5\' 9"  (1.753 m), weight 96.616 kg (213 lb), SpO2 98 %.  Physical Exam:  General:  No acute distress, awake Extremities: extremities normal, atraumatic, no cyanosis or edema Genitourinary:   Normal bladder Foley: irrigated.    I/O last 3 completed shifts: In: 11600 [P.O.:840; I.V.:1060; Other:9700] Out: [id number] [Urine:16190]  No results for input(s): HGB, WBC, PLT in the last 72 hours.  No results for input(s): NA, K, CL, CO2, BUN, CREATININE, CALCIUM, GFRNONAA, GFRAA in the last 72 hours.  Invalid input(s): MAGNESIUM   No results for input(s): INR, APTT in the last 72 hours.  Invalid input(s): PT   Invalid input(s): ABG  Assessment/Plan:  Catheter not removed. Irrigate catheter. Will keep in house. Bleeding 2ndary ASA per Cardiology. He has a drug eluding stent ( 6 months). He will be kept in house.

## 2014-09-28 NOTE — Care Management Note (Signed)
Case Management Note  Patient Details  Name: Christian [name] MRN: [id number] Date of Birth: 07-30-1945  Subjective/Objective:  69 y/o m admitted w/Prostate Ca. From home.                  Action/Plan:No anticipated d/c needs.   Expected Discharge Date:                  Expected Discharge Plan:  Home/Self Care  In-House Referral:     Discharge planning Services  CM Consult  Post Acute Care Choice:    Choice offered to:     DME Arranged:    DME Agency:     HH Arranged:    HH Agency:     Status of Service:  In process, will continue to follow  Medicare Important Message Given:    Date Medicare IM Given:    Medicare IM give by:    Date Additional Medicare IM Given:    Additional Medicare Important Message give by:     If discussed at [place] of Stay Meetings, dates discussed:    Additional Comments:  Dessa Phi, RN 09/28/2014, 2:40 PM

## 2014-09-29 DIAGNOSIS — Z8249 Family history of ischemic heart disease and other diseases of the circulatory system: Secondary | ICD-10-CM | POA: Diagnosis not present

## 2014-09-29 DIAGNOSIS — N99820 Postprocedural hemorrhage and hematoma of a genitourinary system organ or structure following a genitourinary system procedure: Secondary | ICD-10-CM | POA: Diagnosis not present

## 2014-09-29 DIAGNOSIS — E559 Vitamin D deficiency, unspecified: Secondary | ICD-10-CM | POA: Diagnosis present

## 2014-09-29 DIAGNOSIS — Z791 Long term (current) use of non-steroidal anti-inflammatories (NSAID): Secondary | ICD-10-CM | POA: Diagnosis not present

## 2014-09-29 DIAGNOSIS — K219 Gastro-esophageal reflux disease without esophagitis: Secondary | ICD-10-CM | POA: Diagnosis present

## 2014-09-29 DIAGNOSIS — N3281 Overactive bladder: Secondary | ICD-10-CM | POA: Diagnosis present

## 2014-09-29 DIAGNOSIS — E291 Testicular hypofunction: Secondary | ICD-10-CM | POA: Diagnosis present

## 2014-09-29 DIAGNOSIS — Z6831 Body mass index (BMI) 31.0-31.9, adult: Secondary | ICD-10-CM | POA: Diagnosis not present

## 2014-09-29 DIAGNOSIS — Y848 Other medical procedures as the cause of abnormal reaction of the patient, or of later complication, without mention of misadventure at the time of the procedure: Secondary | ICD-10-CM | POA: Diagnosis not present

## 2014-09-29 DIAGNOSIS — Z01812 Encounter for preprocedural laboratory examination: Secondary | ICD-10-CM | POA: Diagnosis not present

## 2014-09-29 DIAGNOSIS — T39015A Adverse effect of aspirin, initial encounter: Secondary | ICD-10-CM | POA: Diagnosis not present

## 2014-09-29 DIAGNOSIS — Z955 Presence of coronary angioplasty implant and graft: Secondary | ICD-10-CM | POA: Diagnosis not present

## 2014-09-29 DIAGNOSIS — N138 Other obstructive and reflux uropathy: Secondary | ICD-10-CM | POA: Diagnosis present

## 2014-09-29 DIAGNOSIS — Z7982 Long term (current) use of aspirin: Secondary | ICD-10-CM | POA: Diagnosis not present

## 2014-09-29 DIAGNOSIS — Z7952 Long term (current) use of systemic steroids: Secondary | ICD-10-CM | POA: Diagnosis not present

## 2014-09-29 DIAGNOSIS — Z9079 Acquired absence of other genital organ(s): Secondary | ICD-10-CM | POA: Diagnosis present

## 2014-09-29 DIAGNOSIS — I10 Essential (primary) hypertension: Secondary | ICD-10-CM | POA: Diagnosis present

## 2014-09-29 DIAGNOSIS — I2510 Atherosclerotic heart disease of native coronary artery without angina pectoris: Secondary | ICD-10-CM | POA: Diagnosis present

## 2014-09-29 DIAGNOSIS — D691 Qualitative platelet defects: Secondary | ICD-10-CM | POA: Diagnosis present

## 2014-09-29 DIAGNOSIS — Z79899 Other long term (current) drug therapy: Secondary | ICD-10-CM | POA: Diagnosis not present

## 2014-09-29 DIAGNOSIS — Z8042 Family history of malignant neoplasm of prostate: Secondary | ICD-10-CM | POA: Diagnosis not present

## 2014-09-29 DIAGNOSIS — E119 Type 2 diabetes mellitus without complications: Secondary | ICD-10-CM | POA: Diagnosis present

## 2014-09-29 DIAGNOSIS — N529 Male erectile dysfunction, unspecified: Secondary | ICD-10-CM | POA: Diagnosis present

## 2014-09-29 DIAGNOSIS — N3946 Mixed incontinence: Secondary | ICD-10-CM | POA: Diagnosis present

## 2014-09-29 DIAGNOSIS — Z833 Family history of diabetes mellitus: Secondary | ICD-10-CM | POA: Diagnosis not present

## 2014-09-29 DIAGNOSIS — E669 Obesity, unspecified: Secondary | ICD-10-CM | POA: Diagnosis present

## 2014-09-29 DIAGNOSIS — Z87891 Personal history of nicotine dependence: Secondary | ICD-10-CM | POA: Diagnosis not present

## 2014-09-29 DIAGNOSIS — N401 Enlarged prostate with lower urinary tract symptoms: Secondary | ICD-10-CM | POA: Diagnosis present

## 2014-09-29 LAB — PLATELET FUNCTION ASSAY
Collagen / ADP: 100 seconds (ref 0–118)
Collagen / Epinephrine: 212 seconds — ABNORMAL HIGH (ref 0–193)

## 2014-09-29 LAB — HEMOGLOBIN AND HEMATOCRIT, BLOOD
HCT: 27.1 % — ABNORMAL LOW (ref 39.0–52.0)
Hemoglobin: 9.2 g/dL — ABNORMAL LOW (ref 13.0–17.0)

## 2014-09-29 LAB — PROTIME-INR
INR: 1.09 (ref 0.00–1.49)
Prothrombin Time: 14.3 seconds (ref 11.6–15.2)

## 2014-09-29 LAB — APTT: aPTT: 28 seconds (ref 24–37)

## 2014-09-29 MED ORDER — LIDOCAINE 5 % EX OINT
TOPICAL_OINTMENT | Freq: Three times a day (TID) | CUTANEOUS | Status: DC | PRN
  Administered 2014-09-29: 22:00:00 via TOPICAL
  Filled 2014-09-29: qty 35.44

## 2014-09-29 MED ORDER — LIDOCAINE 5 % EX OINT
TOPICAL_OINTMENT | Freq: Two times a day (BID) | CUTANEOUS | Status: DC
  Administered 2014-09-29 – 2014-10-01 (×3): via TOPICAL
  Filled 2014-09-29: qty 35.44

## 2014-09-29 NOTE — Progress Notes (Signed)
Urology Progress Note  2 Days Post-Op   Subjective: Urinary post op bleeding /clots, 2ndary ASA for cardiac stent.    No acute urologic events overnight. Ambulation:   negative Flatus:    positive Bowel movement  positive  Pain: some relief  Objective:  Blood pressure 153/71, pulse 116, temperature 98.5 F (36.9 C), temperature source Oral, resp. rate 18, height 5\' 9"  (1.753 m), weight 96.616 kg (213 lb), SpO2 99 %.  Physical Exam:  General:  No acute distress, awake  Genitourinary:  Wnl.  Foley: traction and CBI   I/O last 3 completed shifts: In: [id number] [P.O.:1080; I.V.:1150; [id number] Out: [id number]  No results for input(s): HGB, WBC, PLT in the last 72 hours.  No results for input(s): NA, K, CL, CO2, BUN, CREATININE, CALCIUM, GFRNONAA, GFRAA in the last 72 hours.  Invalid input(s): MAGNESIUM    Assessment/Plan:  Catheter not removed. Needs [place] irrigation.  Continue any current medications.

## 2014-09-29 NOTE — Progress Notes (Signed)
RN irrigated pt so many times since last night because of pt complaining pressure and discomfort. CBI already on free flow but still huge amount of clots has been collected. At this time patient does not  complain of any pressure or discomfort. Urine  remain red to pink color. Will continue to monitor.

## 2014-09-29 NOTE — Progress Notes (Signed)
Urology Progress Note  2 Days Post-Op   Subjective: Urine clear this pm.     No acute urologic events overnight. Ambulation:   negative Flatus:    positive Bowel movement  positive  Pain: some relief  Objective:  Blood pressure 153/73, pulse 116, temperature 98.1 F (36.7 C), temperature source Oral, resp. rate 19, height 5\' 9"  (1.753 m), weight 96.616 kg (213 lb), SpO2 100 %.  Physical Exam:  General:  No acute distress, awake  Genitourinary:  Neg.  Foley: clear urine.     I/O last 3 completed shifts: In: [id number] [P.O.:1080; I.V.:1150; [id number] Out: [id number]  Recent Labs     09/29/14  1017  HGB  9.2*    No results for input(s): NA, K, CL, CO2, BUN, CREATININE, CALCIUM, GFRNONAA, GFRAA in the last 72 hours.  Invalid input(s): MAGNESIUM   Recent Labs     09/29/14  1017  INR  1.09  APTT  28     Invalid input(s): ABG  Assessment/Plan:  Catheter not removed. For removal in AM.

## 2014-09-30 MED ORDER — SODIUM CHLORIDE 0.9 % IR SOLN
Status: DC
  Administered 2014-09-30: 13:00:00 via INTRAVESICAL
  Filled 2014-09-30 (×2): qty 40

## 2014-09-30 MED ORDER — SODIUM CHLORIDE 0.9 % IR SOLN
3000.0000 mL | Status: DC
  Administered 2014-09-30: 3000 mL via INTRAVESICAL

## 2014-09-30 NOTE — Progress Notes (Signed)
Notified Dr Georg Ruddle that pt was having bladderer pain 10/10 . Foley was flushed and small size  blood clots in large amounts were extracted. Stopped  Carboprost drip and started CBI . Dr approved of decision made

## 2014-09-30 NOTE — Progress Notes (Signed)
No significant change from 12:53 note.  Any attempt to slow down CBI fail.  Infusing approx 2600cc's  cbi per hour.  Total input for past 6 hours 15,250 and out 15,590 with true urine 640cc's.  Having to hand irrigate more often in past 6 hours for a total of 10 times with usually a  moderate amount of  dime size clot load.  Therefore, foley was not d/c'd and will await MD to round for further instruction.

## 2014-09-30 NOTE — Progress Notes (Signed)
Pt in bed not complaining of pain at this moment. Foley patent.

## 2014-09-30 NOTE — Progress Notes (Signed)
1930 to 2333  Continues not to tolerate decrease in CBI.  All attempts to decrease CBI result in clot formation and retention with a need to hand irrigate.  Have hand irrigated x5 in past 4 hours.  However, clots are becoming smaller.  The largest obtained in last 4 hours has been a dime size.  Input has been 15,920 with output 17450 for a net urine of 1530cc's in those 4 hours.  cbi continues to run a at fast rate (approx 1 bag of CBI q15 mins) to keep urine clear to light pink.

## 2014-09-30 NOTE — Progress Notes (Signed)
Urology Progress Note  3 Days Post-Op   Subjective: sudden onset of prostatic bleeding and cloit retention.      No acute urologic events overnight. Ambulation:   positive Flatus:    positive Bowel movement  positive  Pain: some relief  Objective:  Blood pressure 151/82, pulse 114, temperature 98.6 F (37 C), temperature source Oral, resp. rate 18, height 5\' 9"  (1.753 m), weight 96.616 kg (213 lb), SpO2 99 %.  Physical Exam:  General:  No acute distress, awake  Genitourinary:   neg Foley: 3-way    I/O last 3 completed shifts: In: [id number] [P.O.:1560; I.V.:1700; [id number] Out: [id number]  Recent Labs     09/29/14  1017  HGB  9.2*    No results for input(s): NA, K, CL, CO2, BUN, CREATININE, CALCIUM, GFRNONAA, GFRAA in the last 72 hours.  Invalid input(s): MAGNESIUM   Recent Labs     09/29/14  1017  INR  1.09  APTT  28     Invalid input(s): ABG  Assessment/Plan: Note drop in Hgb, but normal coags. Awaiting platelet panel.   P: 1. Await platelet panel 2. Carboprost protocol today.

## 2014-10-01 MED ORDER — TRIMETHOPRIM 100 MG PO TABS: 100.0000 mg | ORAL_TABLET | ORAL | Status: DC

## 2014-10-01 MED ORDER — TRAMADOL-ACETAMINOPHEN 37.5-325 MG PO TABS: 1.0000 | ORAL_TABLET | Freq: Four times a day (QID) | ORAL | Status: DC | PRN

## 2014-10-01 MED ORDER — HYOSCYAMINE SULFATE 0.125 MG SL SUBL: 0.1250 mg | SUBLINGUAL_TABLET | SUBLINGUAL | Status: DC | PRN

## 2014-10-01 MED ORDER — ALPRAZOLAM 0.25 MG PO TABS: 0.2500 mg | ORAL_TABLET | Freq: Three times a day (TID) | ORAL | Status: DC | PRN

## 2014-10-01 NOTE — Progress Notes (Signed)
Reviewed discharge information with patient and caregiver/wife. Answered all questions. Patient/caregiver able to teach back medications and reasons to contact MD/911.verbalizes understanding not to restart aspirin at this time. Verbalizes understanding to follow up with cardiologist.  Patient verbalizes importance of urology follow up appointment.

## 2014-10-01 NOTE — Discharge Instructions (Signed)
Post transurethral resection of the prostate (TURP) instructions ° °Your recent prostate surgery requires very special post hospital care. Despite the fact that no skin incisions were used the area around the prostate incision is quite raw and is covered with a scab to promote healing and prevent bleeding. Certain cautions are needed to assure that the scab is not disturbed of the next 2-3 weeks while the healing proceeds. ° °Because the raw surface in your prostate and the irritating effects of urine you may expect frequency of urination and/or urgency (a stronger desire to urinate) and perhaps even getting up at night more often. This will usually resolve or improve slowly over the healing period. You may see some blood in your urine over the first 6 weeks. Do not be alarmed, even if the urine was clear for a while. Get off your feet and drink lots of fluids until clearing occurs. If you start to pass clots or don't improve call us. ° °Diet: ° °You may return to your normal diet immediately. Because of the raw surface of your bladder, alcohol, spicy foods, foods high in acid and drinks with caffeine may cause irritation or frequency and should be used in moderation. To keep your urine flowing freely and avoid constipation, drink plenty of fluids during the day (8-10 glasses). Tip: Avoid cranberry juice because it is very acidic. ° °Activity: ° °Your physical activity doesn't need to be restricted. However, if you are very active, you may see some blood in the urine. We suggest that you reduce your activity under the circumstances until the bleeding has stopped. ° °Bowels: ° °It is important to keep your bowels regular during the postoperative period. Straining with bowel movements can cause bleeding. A bowel movement every other day is reasonable. Use a mild laxative if needed, such as milk of magnesia 2-3 tablespoons, or 2 Dulcolax tablets. Call if you continue to have problems. If you had been taking narcotics  for pain, before, during or after your surgery, you may be constipated. Take a laxative if necessary. ° °Medication: ° °You should resume your pre-surgery medications unless told not to. In addition you may be given an antibiotic to prevent or treat infection. Antibiotics are not always necessary. All medication should be taken as prescribed until the bottles are finished unless you are having an unusual reaction to one of the drugs. ° ° ° ° °Problems you should report to us: ° °a. Fever greater than 101°F. °b. Heavy bleeding, or clots (see notes above about blood in urine). °c. Inability to urinate. °d. Drug reactions (hives, rash, nausea, vomiting, diarrhea). °e. Severe burning or pain with urination that is not improving. ° °

## 2014-10-01 NOTE — Care Management Note (Signed)
Case Management Note  Patient Details  Name: Christian [name] MRN: [id number] Date of Birth: 21-Feb-1946  Subjective/Objective:   May d/c home if voids or may return to OR per urology note.                Action/Plan:No d/c needs or orders.   Expected Discharge Date:                  Expected Discharge Plan:  Home/Self Care  In-House Referral:     Discharge planning Services  CM Consult  Post Acute Care Choice:    Choice offered to:     DME Arranged:    DME Agency:     HH Arranged:    HH Agency:     Status of Service:  In process, will continue to follow  Medicare Important Message Given:    Date Medicare IM Given:    Medicare IM give by:    Date Additional Medicare IM Given:    Additional Medicare Important Message give by:     If discussed at [place] of Stay Meetings, dates discussed:    Additional Comments:  Dessa Phi, RN 10/01/2014, 2:58 PM

## 2014-10-01 NOTE — Progress Notes (Addendum)
1900 to 2359: continues to require cbi to run at fast rate (around 2500cc/hr) to keep urine clear to light pink.  Continues to need hand irrigation - 5 times in past 5 hours resulting in only a small amount of smaller clots tonight.  Total irrigation in past 5 hours = 12200cc's and output 12850 for a net of 650cc true urine .  PO intake was 120cc,s

## 2014-10-01 NOTE — Progress Notes (Signed)
Urology Progress Note  4 Days Post-Op   Subjective: Urine clear again this AM. Only small clots last night.     No acute urologic events overnight. Ambulation:   positive Flatus:    positive Bowel movement  positive  Pain: complete resolution  Objective:  Blood pressure 175/79, pulse 108, temperature 98.7 F (37.1 C), temperature source Oral, resp. rate 18, height 5\' 9"  (1.753 m), weight 96.616 kg (213 lb), SpO2 99 %.  Physical Exam:  General:  No acute distress, awake Extremities: extremities normal, atraumatic, no cyanosis or edema Genitourinary:  neg Foley:remains    I/O last 3 completed shifts: In: 72521.7 [P.O.:960; I.V.:1711.7; [id number] Out: 71040 [id number]  Recent Labs     09/29/14  1017  HGB  9.2*    No results for input(s): NA, K, CL, CO2, BUN, CREATININE, CALCIUM, GFRNONAA, GFRAA in the last 72 hours.  Invalid input(s): MAGNESIUM   Recent Labs     09/29/14  1017  INR  1.09  APTT  28     Invalid input(s): ABG  Assessment/Plan: Path: BPH only.   Wound care discussed.  PLAN: irrigate urine this am to be sure it is clear and leave some irrigant in bladder. D/c foley with string of bottles If pt voids ok, then will D/c after lunch If clot retention again, then make him NPO and return to OR tonight for clot irrigation and cauterization.

## 2014-10-01 NOTE — Discharge Summary (Signed)
Physician Discharge Summary  Patient ID: Christian [name] MRN: [id number] DOB/AGE: 68-Nov-1947 68 y.o.  Admit date: 09/27/2014 Discharge date: 10/01/2014  Admission Diagnoses: BPH  Discharge Diagnoses:  Active Problems:   Benign prostatic hypertrophy Post operative bleeding 2ndary anticoagulation for cardiac stent ( Drug-induced platelet dysfunction).   Discharged Condition: fair  Hospital Course: TURP. Pre-op ASA po per Cardiology. Post op urinary clot retention.   Significant Diagnostic Studies: Collagen/ADP=100                                                         Collagen/Epineph.= 212  Discharge Exam: Blood pressure 167/75, pulse 125, temperature 98.3 F (36.8 C), temperature source Oral, resp. rate 18, height 5\' 9"  (1.753 m), weight 96.616 kg (213 lb), SpO2 100 %.   Disposition: 01-Home or Self Care  Discharge Instructions    Discharge patient    Complete by:  As directed      Discontinue IV    Complete by:  As directed             Medication List    STOP taking these medications        aspirin 81 MG chewable tablet      TAKE these medications        ALPRAZolam 0.25 MG tablet  Commonly known as:  XANAX  Take 1 tablet (0.25 mg total) by mouth 3 (three) times daily as needed for sleep or anxiety.     atorvastatin 20 MG tablet  Commonly known as:  LIPITOR  Take 20 mg by mouth every evening.     CALCIUM 600 + D PO  Take 1 tablet by mouth 2 (two) times daily.     ciclopirox 0.77 % cream  Commonly known as:  LOPROX  Apply 1 application topically 2 (two) times daily.     clopidogrel 75 MG tablet  Commonly known as:  PLAVIX  Take 1 tablet (75 mg total) by mouth daily with breakfast.     COD LIVER OIL PO  Take 1 capsule by mouth every morning.     diltiazem 240 MG 24 hr capsule  Commonly known as:  DILACOR XR  Take 240 mg by mouth daily. NOON     diltiazem 120 MG 24 hr capsule  Commonly known as:  DILACOR XR  Take 120 mg by mouth daily at 12 noon.      Fish Oil 1000 MG Caps  Take 1,000 mg by mouth 2 (two) times daily.     Garlic 123XX123 MG Caps  Take 2,000 mg by mouth every morning.     hyoscyamine 0.125 MG SL tablet  Commonly known as:  LEVSIN/SL  Place 1 tablet (0.125 mg total) under the tongue every 4 (four) hours as needed for cramping.     magnesium gluconate 500 MG tablet  Commonly known as:  MAGONATE  Take 500 mg by mouth every morning.     metFORMIN 1000 MG tablet  Commonly known as:  GLUCOPHAGE  Take 1,000 mg by mouth 2 (two) times daily with a meal.     metoprolol succinate 100 MG 24 hr tablet  Commonly known as:  TOPROL-XL  Take 100 mg by mouth every morning.     mirabegron ER 50 MG Tb24 tablet  Commonly known as:  MYRBETRIQ  Take 50  mg by mouth 2 (two) times daily.     multivitamin tablet  Take 1 tablet by mouth every morning.     naproxen sodium 220 MG tablet  Commonly known as:  ANAPROX  Take 220-440 mg by mouth 2 (two) times daily as needed (pain).     nitroGLYCERIN 0.4 MG SL tablet  Commonly known as:  NITROSTAT  Place 0.4 mg under the tongue every 5 (five) minutes as needed for chest pain.     pyridOXINE 100 MG tablet  Commonly known as:  VITAMIN B-6  Take 100 mg by mouth every morning.     traMADol-acetaminophen 37.5-325 MG per tablet  Commonly known as:  ULTRACET  Take 1 tablet by mouth every 6 (six) hours as needed.     trimethoprim 100 MG tablet  Commonly known as:  TRIMPEX  Take 1 tablet (100 mg total) by mouth 1 day or 1 dose.     valsartan-hydrochlorothiazide 320-25 MG per tablet  Commonly known as:  DIOVAN-HCT  Take 1 tablet by mouth daily.     vitamin B-12 1000 MCG tablet  Commonly known as:  CYANOCOBALAMIN  Take 1,000 mcg by mouth every morning.     vitamin C 1000 MG tablet  Take 1,000 mg by mouth every morning.     Vitamin D-3 5000 UNITS Tabs  Take 5,000 Units by mouth 3 (three) times daily.     vitamin E 400 UNIT capsule  Take 400 Units by mouth every morning.      zinc gluconate 50 MG tablet  Take 50 mg by mouth every morning.           Follow-up Information    Schedule an appointment as soon as possible for a visit with Ailene Rud, MD.   Specialty:  Urology   Contact information:   [address] [place] [address] [phone]      BPH by surgical pathology. Post op bleeding 2ndary platelet dysfunction 2ndary pre-op ASA per Anesthesia/Cardiology Rx.  RTC for post op follow-up.  Hold ASA until urine clear.  SignedAilene Rud 10/01/2014, 5:56 PM

## 2014-10-01 NOTE — Progress Notes (Signed)
Urology Progress Note  4 Days Post-Op   Subjective: Pt voiding small amounts ( 50=-100cc. Urine dark, but no clots.     No acute urologic events overnight. Ambulation:   positive Flatus:    positive Bowel movement  negative  Pain: complete resolution  Objective:  Blood pressure 167/75, pulse 125, temperature 98.3 F (36.8 C), temperature source Oral, resp. rate 18, height 5\' 9"  (1.753 m), weight 96.616 kg (213 lb), SpO2 100 %.  Physical Exam:  General:  No acute distress, awake Extremities: extremities normal, atraumatic, no cyanosis or edema Genitourinary:  normal Foley:  out    I/O last 3 completed shifts: In: 72521.7 [P.O.:960; I.V.:1711.7; [id number] Out: 71040 [id number]  Recent Labs     09/29/14  1017  HGB  9.2*    No results for input(s): NA, K, CL, CO2, BUN, CREATININE, CALCIUM, GFRNONAA, GFRAA in the last 72 hours.  Invalid input(s): MAGNESIUM   Recent Labs     09/29/14  1017  INR  1.09  APTT  28     Invalid input(s): ABG  Assessment/Plan:  Pt is to increase activities as tolerated. He will take citrate of magnesia for constipation.

## 2014-10-02 DIAGNOSIS — I2510 Atherosclerotic heart disease of native coronary artery without angina pectoris: Secondary | ICD-10-CM | POA: Diagnosis not present

## 2014-10-02 DIAGNOSIS — I119 Hypertensive heart disease without heart failure: Secondary | ICD-10-CM | POA: Diagnosis not present

## 2014-10-02 DIAGNOSIS — E785 Hyperlipidemia, unspecified: Secondary | ICD-10-CM | POA: Diagnosis not present

## 2014-10-04 DIAGNOSIS — H1851 Endothelial corneal dystrophy: Secondary | ICD-10-CM | POA: Diagnosis not present

## 2014-10-04 DIAGNOSIS — H40033 Anatomical narrow angle, bilateral: Secondary | ICD-10-CM | POA: Diagnosis not present

## 2014-10-04 DIAGNOSIS — H25813 Combined forms of age-related cataract, bilateral: Secondary | ICD-10-CM | POA: Diagnosis not present

## 2014-10-04 DIAGNOSIS — H40013 Open angle with borderline findings, low risk, bilateral: Secondary | ICD-10-CM | POA: Diagnosis not present

## 2014-10-09 ENCOUNTER — Encounter: Payer: Self-pay | Admitting: Internal Medicine

## 2014-10-09 ENCOUNTER — Ambulatory Visit (INDEPENDENT_AMBULATORY_CARE_PROVIDER_SITE_OTHER): Payer: Medicare Other | Admitting: Internal Medicine

## 2014-10-09 ENCOUNTER — Other Ambulatory Visit (INDEPENDENT_AMBULATORY_CARE_PROVIDER_SITE_OTHER): Payer: Medicare Other

## 2014-10-09 VITALS — BP 150/84 | HR 91 | Temp 98.1°F | Resp 16 | Ht 69.0 in | Wt 209.0 lb

## 2014-10-09 DIAGNOSIS — E785 Hyperlipidemia, unspecified: Secondary | ICD-10-CM | POA: Diagnosis not present

## 2014-10-09 DIAGNOSIS — E119 Type 2 diabetes mellitus without complications: Secondary | ICD-10-CM | POA: Diagnosis not present

## 2014-10-09 DIAGNOSIS — N400 Enlarged prostate without lower urinary tract symptoms: Secondary | ICD-10-CM

## 2014-10-09 DIAGNOSIS — I10 Essential (primary) hypertension: Secondary | ICD-10-CM | POA: Diagnosis not present

## 2014-10-09 LAB — COMPREHENSIVE METABOLIC PANEL
ALT: 30 U/L (ref 0–53)
AST: 26 U/L (ref 0–37)
Albumin: 4.4 g/dL (ref 3.5–5.2)
Alkaline Phosphatase: 79 U/L (ref 39–117)
BUN: 18 mg/dL (ref 6–23)
CO2: 30 mEq/L (ref 19–32)
Calcium: 9.7 mg/dL (ref 8.4–10.5)
Chloride: 102 mEq/L (ref 96–112)
Creatinine, Ser: 1.33 mg/dL (ref 0.40–1.50)
GFR: 68.84 mL/min (ref >60.00)
Glucose, Bld: 87 mg/dL (ref 70–99)
Potassium: 4.1 mEq/L (ref 3.5–5.1)
Sodium: 138 mEq/L (ref 135–145)
Total Bilirubin: 0.3 mg/dL (ref 0.2–1.2)
Total Protein: 6.8 g/dL (ref 6.0–8.3)

## 2014-10-09 LAB — CBC
HCT: 29.3 % — ABNORMAL LOW (ref 39.0–52.0)
Hemoglobin: 9.6 g/dL — ABNORMAL LOW (ref 13.0–17.0)
MCHC: 33 g/dL (ref 30.0–36.0)
MCV: 101 fl — ABNORMAL HIGH (ref 78.0–100.0)
Platelets: 389 10*3/uL (ref 150.0–400.0)
RBC: 2.9 Mil/uL — ABNORMAL LOW (ref 4.22–5.81)
RDW: 14.6 % (ref 11.5–15.5)
WBC: 9.3 10*3/uL (ref 4.0–10.5)

## 2014-10-09 LAB — HEMOGLOBIN A1C: Hgb A1c MFr Bld: 6.4 % (ref 4.6–6.5)

## 2014-10-09 NOTE — Progress Notes (Signed)
Pre visit review using our clinic review tool, if applicable. No additional management support is needed unless otherwise documented below in the visit note. 

## 2014-10-09 NOTE — Patient Instructions (Signed)
We will get your records from your heart doctor, the urologist and your old doctor to make sure that we know what it happening.   It is okay to restart the aspirin and plavix once the urine is clear for 2 days most of the time. If you are still passing clots do not restart the aspirin and plavix.   We will check your blood work today and call you back with the results.   Keep working on doing exercise to keep your heart strong and healthy.  Diabetes and Exercise Exercising regularly is important. It is not just about losing weight. It has many health benefits, such as:  Improving your overall fitness, flexibility, and endurance.  Increasing your bone density.  Helping with weight control.  Decreasing your body fat.  Increasing your muscle strength.  Reducing stress and tension.  Improving your overall health. People with diabetes who exercise gain additional benefits because exercise:  Reduces appetite.  Improves the body's use of blood sugar (glucose).  Helps lower or control blood glucose.  Decreases blood pressure.  Helps control blood lipids (such as cholesterol and triglycerides).  Improves the body's use of the hormone insulin by:  Increasing the body's insulin sensitivity.  Reducing the body's insulin needs.  Decreases the risk for heart disease because exercising:  Lowers cholesterol and triglycerides levels.  Increases the levels of good cholesterol (such as high-density lipoproteins [HDL]) in the body.  Lowers blood glucose levels. YOUR ACTIVITY PLAN  Choose an activity that you enjoy and set realistic goals. Your health care provider or diabetes educator can help you make an activity plan that works for you. Exercise regularly as directed by your health care provider. This includes:  Performing resistance training twice a week such as push-ups, sit-ups, lifting weights, or using resistance bands.  Performing 150 minutes of cardio exercises each week  such as walking, running, or playing sports.  Staying active and spending no more than 90 minutes at one time being inactive. Even short bursts of exercise are good for you. Three 10-minute sessions spread throughout the day are just as beneficial as a single 30-minute session. Some exercise ideas include:  Taking the dog for a walk.  Taking the stairs instead of the elevator.  Dancing to your favorite song.  Doing an exercise video.  Doing your favorite exercise with a friend. RECOMMENDATIONS FOR EXERCISING WITH TYPE 1 OR TYPE 2 DIABETES   Check your blood glucose before exercising. If blood glucose levels are greater than 240 mg/dL, check for urine ketones. Do not exercise if ketones are present.  Avoid injecting insulin into areas of the body that are going to be exercised. For example, avoid injecting insulin into:  The arms when playing tennis.  The legs when jogging.  Keep a record of:  Food intake before and after you exercise.  Expected peak times of insulin action.  Blood glucose levels before and after you exercise.  The type and amount of exercise you have done.  Review your records with your health care provider. Your health care provider will help you to develop guidelines for adjusting food intake and insulin amounts before and after exercising.  If you take insulin or oral hypoglycemic agents, watch for signs and symptoms of hypoglycemia. They include:  Dizziness.  Shaking.  Sweating.  Chills.  Confusion.  Drink plenty of water while you exercise to prevent dehydration or heat stroke. Body water is lost during exercise and must be replaced.  Talk to your  health care provider before starting an exercise program to make sure it is safe for you. Remember, almost any type of activity is better than none. Document Released: 07/04/2003 Document Revised: 08/28/2013 Document Reviewed: 09/20/2012 [facility] Patient Information 2015 [place], [place]. This  information is not intended to replace advice given to you by your health care provider. Make sure you discuss any questions you have with your health care provider.

## 2014-10-09 NOTE — Progress Notes (Signed)
   Subjective:    Patient ID: Christian [name], male    DOB: 1945/07/07, 69 y.o.   MRN: [id number]  HPI The patient is a 69 YO man who is coming in new for a hospital follow up. He was having a TURP and had excessive bleeding due to his dual antiplatelet therapy for his recent cardiac stent (02/2014). He went home and has still been passing some blood and clots until Monday this week. He has had clear urine mostly today. No pain, fevers, chills. No burning when he urinates. He is still taking medications per his urologist on discharge and is off his aspirin and plavix. He did not get clear instructions when to resume his aspirin and plavix. Denies chest pains, SOB. He is working on exercise after his stent.   PMH, [facility], social history reviewed and updated.   Review of Systems  Constitutional: Negative for fever, activity change, appetite change and fatigue.  HENT: Negative.   Eyes: Negative.   Respiratory: Negative for cough, chest tightness, shortness of breath and wheezing.   Cardiovascular: Negative for chest pain, palpitations and leg swelling.  Gastrointestinal: Negative for abdominal pain, diarrhea, constipation, blood in stool and abdominal distention.  Genitourinary: Negative for hematuria.  Musculoskeletal: Negative.   Skin: Negative.   Neurological: Negative.   Psychiatric/Behavioral: Negative.       Objective:   Physical Exam  Constitutional: He is oriented to person, place, and time. He appears well-developed and well-nourished.  HENT:  Head: Normocephalic and atraumatic.  Eyes: EOM are normal.  Neck: Normal range of motion.  Cardiovascular: Normal rate and regular rhythm.   Pulmonary/Chest: Effort normal and breath sounds normal. No respiratory distress. He has no wheezes.  Abdominal: Soft. He exhibits no distension. There is no tenderness. There is no rebound.  Musculoskeletal: He exhibits no edema.  Neurological: He is alert and oriented to person, place, and time.  Coordination normal.  Skin: Skin is warm and dry.  Psychiatric: He has a normal mood and affect.   Filed Vitals:   10/09/14 1257  BP: 166/78  Pulse: 91  Temp: 98.1 F (36.7 C)  TempSrc: Oral  Resp: 16  Height: 5\' 9"  (1.753 m)  Weight: 209 lb (94.802 kg)  SpO2: 92%      Assessment & Plan:

## 2014-10-10 DIAGNOSIS — E119 Type 2 diabetes mellitus without complications: Secondary | ICD-10-CM

## 2014-10-10 DIAGNOSIS — E1142 Type 2 diabetes mellitus with diabetic polyneuropathy: Secondary | ICD-10-CM | POA: Insufficient documentation

## 2014-10-10 HISTORY — DX: Type 2 diabetes mellitus without complications: E11.9

## 2014-10-10 NOTE — Assessment & Plan Note (Addendum)
Taking metformin 1000 mg bid. At this time well controlled (last known HgA1c 6.9 in Dec 2015). Also on ARB therapy and statin. Checking HgA1c and talked to him about the importance of yearly eye exam. Foot exam done today without findings.

## 2014-10-10 NOTE — Assessment & Plan Note (Signed)
On statin and no side effects. Will get records from his doctors with last lipid panel and check again when indicated (should be yearly)

## 2014-10-10 NOTE — Assessment & Plan Note (Signed)
BP initially elevated but came down to normal with sitting for 10 minutes. Is controlled on valsartan/hctz, metoprolol, diltiazem. Checking CMP today. Adjust as needed.

## 2014-10-10 NOTE — Assessment & Plan Note (Addendum)
Now that his urine is clear today if still clear tomorrow will have him resume ASA and plavix. He will continue to follow with urology. Checking CBC since he had extended bleeding after his TURP.

## 2014-10-11 ENCOUNTER — Telehealth: Payer: Self-pay

## 2014-10-12 ENCOUNTER — Other Ambulatory Visit: Payer: Self-pay | Admitting: Internal Medicine

## 2014-10-12 MED ORDER — BUMETANIDE 0.5 MG PO TABS: 0.5000 mg | ORAL_TABLET | Freq: Every day | ORAL | Status: DC

## 2014-10-18 DIAGNOSIS — R3916 Straining to void: Secondary | ICD-10-CM | POA: Diagnosis not present

## 2014-10-18 DIAGNOSIS — R310 Gross hematuria: Secondary | ICD-10-CM | POA: Diagnosis not present

## 2014-10-24 DIAGNOSIS — R3916 Straining to void: Secondary | ICD-10-CM | POA: Diagnosis not present

## 2014-10-31 DIAGNOSIS — I2510 Atherosclerotic heart disease of native coronary artery without angina pectoris: Secondary | ICD-10-CM | POA: Diagnosis not present

## 2014-10-31 DIAGNOSIS — I119 Hypertensive heart disease without heart failure: Secondary | ICD-10-CM | POA: Diagnosis not present

## 2014-10-31 DIAGNOSIS — E785 Hyperlipidemia, unspecified: Secondary | ICD-10-CM | POA: Diagnosis not present

## 2014-11-06 ENCOUNTER — Other Ambulatory Visit: Payer: Self-pay | Admitting: Internal Medicine

## 2014-11-07 NOTE — Telephone Encounter (Signed)
error 

## 2015-01-15 DIAGNOSIS — I25119 Atherosclerotic heart disease of native coronary artery with unspecified angina pectoris: Secondary | ICD-10-CM | POA: Diagnosis not present

## 2015-01-15 DIAGNOSIS — E785 Hyperlipidemia, unspecified: Secondary | ICD-10-CM | POA: Diagnosis not present

## 2015-01-15 DIAGNOSIS — I119 Hypertensive heart disease without heart failure: Secondary | ICD-10-CM | POA: Diagnosis not present

## 2015-01-16 DIAGNOSIS — I119 Hypertensive heart disease without heart failure: Secondary | ICD-10-CM | POA: Diagnosis not present

## 2015-01-16 DIAGNOSIS — E785 Hyperlipidemia, unspecified: Secondary | ICD-10-CM | POA: Diagnosis not present

## 2015-02-07 ENCOUNTER — Other Ambulatory Visit: Payer: Self-pay | Admitting: Internal Medicine

## 2015-02-11 ENCOUNTER — Encounter: Payer: Self-pay | Admitting: Internal Medicine

## 2015-02-11 ENCOUNTER — Other Ambulatory Visit (INDEPENDENT_AMBULATORY_CARE_PROVIDER_SITE_OTHER): Payer: Medicare Other

## 2015-02-11 ENCOUNTER — Ambulatory Visit (INDEPENDENT_AMBULATORY_CARE_PROVIDER_SITE_OTHER): Payer: Medicare Other | Admitting: Internal Medicine

## 2015-02-11 VITALS — BP 158/82 | HR 89 | Temp 98.4°F | Resp 14 | Ht 68.5 in | Wt 210.1 lb

## 2015-02-11 DIAGNOSIS — E119 Type 2 diabetes mellitus without complications: Secondary | ICD-10-CM

## 2015-02-11 DIAGNOSIS — Z23 Encounter for immunization: Secondary | ICD-10-CM

## 2015-02-11 DIAGNOSIS — I10 Essential (primary) hypertension: Secondary | ICD-10-CM | POA: Diagnosis not present

## 2015-02-11 LAB — TSH: TSH: 0.82 u[IU]/mL (ref 0.35–4.50)

## 2015-02-11 LAB — HEMOGLOBIN A1C: Hgb A1c MFr Bld: 6.9 % — ABNORMAL HIGH (ref 4.6–6.5)

## 2015-02-11 NOTE — Progress Notes (Signed)
Pre visit review using our clinic review tool, if applicable. No additional management support is needed unless otherwise documented below in the visit note. 

## 2015-02-11 NOTE — Patient Instructions (Addendum)
We will have you stop taking the bumex (bumetanide).   We are checking labs today for the sugars and the thyroid to see if we can find out why you have no energy.

## 2015-02-14 NOTE — Progress Notes (Signed)
   Subjective:    Patient ID: Christian [name], male    DOB: 06-12-1945, 69 y.o.   MRN: [id number]  HPI The patient is a 69 YO man coming in for follow up of his diabetes. It has been present for many years and has been well controlled on metformin alone. Denies side effects such as GI or bloating. No numbness in his feet or neuropathic pains. No high or low sugars since last visit. He does admit that his diet is not the best ever and could use some improvement. Exercising minimally but has been better in the past. Has not had eye exam in some time. Can not give urine sample today.  Review of Systems  Constitutional: Negative for fever, activity change, appetite change and fatigue.  HENT: Negative.   Eyes: Negative.   Respiratory: Negative for cough, chest tightness, shortness of breath and wheezing.   Cardiovascular: Negative for chest pain, palpitations and leg swelling.  Gastrointestinal: Negative for abdominal pain, diarrhea, constipation, blood in stool and abdominal distention.  Genitourinary: Negative for hematuria.  Musculoskeletal: Negative.   Skin: Negative.   Neurological: Negative.   Psychiatric/Behavioral: Negative.       Objective:   Physical Exam  Constitutional: He is oriented to person, place, and time. He appears well-developed and well-nourished.  HENT:  Head: Normocephalic and atraumatic.  Eyes: EOM are normal.  Neck: Normal range of motion.  Cardiovascular: Normal rate and regular rhythm.   Pulmonary/Chest: Effort normal and breath sounds normal. No respiratory distress. He has no wheezes.  Abdominal: Soft. He exhibits no distension. There is no tenderness. There is no rebound.  Musculoskeletal: He exhibits no edema.  Neurological: He is alert and oriented to person, place, and time. Coordination normal.  Skin: Skin is warm and dry.  Psychiatric: He has a normal mood and affect.   Filed Vitals:   02/11/15 1304  BP: 158/82  Pulse: 89  Temp: 98.4 F (36.9 C)   TempSrc: Oral  Resp: 14  Height: 5' 8.5" (1.74 m)  Weight: 210 lb 1.3 oz (95.292 kg)  SpO2: 97%      Assessment & Plan:  Flu shot given at visit.

## 2015-02-14 NOTE — Assessment & Plan Note (Signed)
Checking HgA1c and previously well controlled on metformin. Is on ARB as well and statin. Talked to him about the need for eye exam. He is not able to give a urine sample today so did not order microalbumin but will at next time. Adjust as needed. Foot exam up to date.

## 2015-02-14 NOTE — Assessment & Plan Note (Signed)
BP well controlled previously and has not taken his meds yet today so will not adjust today. If still elevated at follow up will adjust regimen. Continue diltiazem, valsartan, hctz and metoprolol for now. He will discontinue bumex as this is not helped with his BP and is giving him excessive urination and more risk for hypokalemia. He does not have significant swelling now like he did in the past.

## 2015-02-15 ENCOUNTER — Other Ambulatory Visit: Payer: Self-pay | Admitting: Internal Medicine

## 2015-02-18 ENCOUNTER — Other Ambulatory Visit: Payer: Self-pay | Admitting: Internal Medicine

## 2015-03-19 DIAGNOSIS — L03031 Cellulitis of right toe: Secondary | ICD-10-CM | POA: Diagnosis not present

## 2015-03-19 DIAGNOSIS — L600 Ingrowing nail: Secondary | ICD-10-CM | POA: Diagnosis not present

## 2015-04-16 DIAGNOSIS — E785 Hyperlipidemia, unspecified: Secondary | ICD-10-CM | POA: Diagnosis not present

## 2015-04-16 DIAGNOSIS — I25119 Atherosclerotic heart disease of native coronary artery with unspecified angina pectoris: Secondary | ICD-10-CM | POA: Diagnosis not present

## 2015-04-16 DIAGNOSIS — I119 Hypertensive heart disease without heart failure: Secondary | ICD-10-CM | POA: Diagnosis not present

## 2015-04-17 DIAGNOSIS — D500 Iron deficiency anemia secondary to blood loss (chronic): Secondary | ICD-10-CM | POA: Diagnosis not present

## 2015-04-17 DIAGNOSIS — R079 Chest pain, unspecified: Secondary | ICD-10-CM | POA: Diagnosis not present

## 2015-04-17 DIAGNOSIS — E119 Type 2 diabetes mellitus without complications: Secondary | ICD-10-CM | POA: Diagnosis not present

## 2015-04-17 DIAGNOSIS — I2510 Atherosclerotic heart disease of native coronary artery without angina pectoris: Secondary | ICD-10-CM | POA: Diagnosis not present

## 2015-04-25 DIAGNOSIS — N401 Enlarged prostate with lower urinary tract symptoms: Secondary | ICD-10-CM | POA: Diagnosis not present

## 2015-04-25 DIAGNOSIS — N3281 Overactive bladder: Secondary | ICD-10-CM | POA: Diagnosis not present

## 2015-04-25 DIAGNOSIS — N138 Other obstructive and reflux uropathy: Secondary | ICD-10-CM | POA: Diagnosis not present

## 2015-05-07 DIAGNOSIS — D124 Benign neoplasm of descending colon: Secondary | ICD-10-CM | POA: Diagnosis not present

## 2015-05-07 DIAGNOSIS — K5730 Diverticulosis of large intestine without perforation or abscess without bleeding: Secondary | ICD-10-CM | POA: Diagnosis not present

## 2015-05-07 DIAGNOSIS — D500 Iron deficiency anemia secondary to blood loss (chronic): Secondary | ICD-10-CM | POA: Diagnosis not present

## 2015-05-07 DIAGNOSIS — D122 Benign neoplasm of ascending colon: Secondary | ICD-10-CM | POA: Diagnosis not present

## 2015-05-07 DIAGNOSIS — K635 Polyp of colon: Secondary | ICD-10-CM | POA: Diagnosis not present

## 2015-05-07 DIAGNOSIS — D509 Iron deficiency anemia, unspecified: Secondary | ICD-10-CM | POA: Diagnosis not present

## 2015-05-07 DIAGNOSIS — K2970 Gastritis, unspecified, without bleeding: Secondary | ICD-10-CM | POA: Diagnosis not present

## 2015-05-07 DIAGNOSIS — K2960 Other gastritis without bleeding: Secondary | ICD-10-CM | POA: Diagnosis not present

## 2015-05-21 ENCOUNTER — Encounter: Payer: Self-pay | Admitting: Internal Medicine

## 2015-06-05 DIAGNOSIS — K2970 Gastritis, unspecified, without bleeding: Secondary | ICD-10-CM | POA: Diagnosis not present

## 2015-06-05 DIAGNOSIS — D500 Iron deficiency anemia secondary to blood loss (chronic): Secondary | ICD-10-CM | POA: Diagnosis not present

## 2015-08-07 DIAGNOSIS — D500 Iron deficiency anemia secondary to blood loss (chronic): Secondary | ICD-10-CM | POA: Diagnosis not present

## 2015-08-07 DIAGNOSIS — I25118 Atherosclerotic heart disease of native coronary artery with other forms of angina pectoris: Secondary | ICD-10-CM | POA: Diagnosis not present

## 2015-08-07 DIAGNOSIS — E785 Hyperlipidemia, unspecified: Secondary | ICD-10-CM | POA: Diagnosis not present

## 2015-08-07 DIAGNOSIS — I119 Hypertensive heart disease without heart failure: Secondary | ICD-10-CM | POA: Diagnosis not present

## 2015-08-12 ENCOUNTER — Other Ambulatory Visit: Payer: Self-pay | Admitting: Internal Medicine

## 2015-08-13 ENCOUNTER — Encounter: Payer: Self-pay | Admitting: Internal Medicine

## 2015-08-13 ENCOUNTER — Other Ambulatory Visit (INDEPENDENT_AMBULATORY_CARE_PROVIDER_SITE_OTHER): Payer: Medicare Other

## 2015-08-13 ENCOUNTER — Ambulatory Visit (INDEPENDENT_AMBULATORY_CARE_PROVIDER_SITE_OTHER): Payer: Medicare Other | Admitting: Internal Medicine

## 2015-08-13 VITALS — BP 154/76 | HR 87 | Temp 98.2°F | Resp 16 | Ht 69.0 in | Wt 210.0 lb

## 2015-08-13 DIAGNOSIS — H9209 Otalgia, unspecified ear: Secondary | ICD-10-CM | POA: Insufficient documentation

## 2015-08-13 DIAGNOSIS — H9203 Otalgia, bilateral: Secondary | ICD-10-CM | POA: Diagnosis not present

## 2015-08-13 DIAGNOSIS — I10 Essential (primary) hypertension: Secondary | ICD-10-CM

## 2015-08-13 DIAGNOSIS — E785 Hyperlipidemia, unspecified: Secondary | ICD-10-CM

## 2015-08-13 DIAGNOSIS — Z23 Encounter for immunization: Secondary | ICD-10-CM

## 2015-08-13 DIAGNOSIS — E119 Type 2 diabetes mellitus without complications: Secondary | ICD-10-CM

## 2015-08-13 LAB — COMPREHENSIVE METABOLIC PANEL
ALT: 21 U/L (ref 0–53)
AST: 17 U/L (ref 0–37)
Albumin: 4.4 g/dL (ref 3.5–5.2)
Alkaline Phosphatase: 62 U/L (ref 39–117)
BUN: 13 mg/dL (ref 6–23)
CO2: 27 mEq/L (ref 19–32)
Calcium: 9.5 mg/dL (ref 8.4–10.5)
Chloride: 105 mEq/L (ref 96–112)
Creatinine, Ser: 1.2 mg/dL (ref 0.40–1.50)
GFR: 77.32 mL/min (ref >60.00)
Glucose, Bld: 103 mg/dL — ABNORMAL HIGH (ref 70–99)
Potassium: 3.6 mEq/L (ref 3.5–5.1)
Sodium: 140 mEq/L (ref 135–145)
Total Bilirubin: 0.3 mg/dL (ref 0.2–1.2)
Total Protein: 6.9 g/dL (ref 6.0–8.3)

## 2015-08-13 LAB — LIPID PANEL
Cholesterol: 106 mg/dL (ref 0–200)
HDL: 46.2 mg/dL (ref >39.00)
LDL Cholesterol: 36 mg/dL (ref 0–99)
NonHDL: 59.78
Total CHOL/HDL Ratio: 2
Triglycerides: 118 mg/dL (ref 0.0–149.0)
VLDL: 23.6 mg/dL (ref 0.0–40.0)

## 2015-08-13 LAB — HEMOGLOBIN A1C: Hgb A1c MFr Bld: 6.9 % — ABNORMAL HIGH (ref 4.6–6.5)

## 2015-08-13 MED ORDER — NEOMYCIN-POLYMYXIN-HC 3.5-10000-1 OT SOLN: 3.0000 [drp] | Freq: Two times a day (BID) | OTIC | Status: DC

## 2015-08-13 NOTE — Addendum Note (Signed)
Addended by: Resa Miner R on: 08/13/2015 02:25 PM   Modules accepted: Orders

## 2015-08-13 NOTE — Progress Notes (Signed)
Pre visit review using our clinic review tool, if applicable. No additional management support is needed unless otherwise documented below in the visit note. 

## 2015-08-13 NOTE — Patient Instructions (Signed)
We are checking the blood work for the sugars today and call you back with the results.   We have also given you the tetanus and the pneumonia shot.

## 2015-08-13 NOTE — Assessment & Plan Note (Signed)
Checking lipid panel, taking lipitor 20 mg daily, adjust as needed.

## 2015-08-13 NOTE — Assessment & Plan Note (Signed)
Treat as otitis externa with corticosporin otic drops to both ears for healing.

## 2015-08-13 NOTE — Assessment & Plan Note (Signed)
Given pneumonia 13 to complete series, checking HgA1c, CMP, lipid panel. Foot exam done today. On metformin and ARB and statin, adjust as needed.

## 2015-08-13 NOTE — Progress Notes (Signed)
   Subjective:    Patient ID: Christian [name], male    DOB: 11/09/1945, 70 y.o.   MRN: [id number]  HPI The patient is a 70 YO man coming in for follow up of his diabetes (taking metformin and ARB, not complicated, no side effects) and his blood pressure (no side effects and still taking diltiazem, imdur, metoprolol, valsartan/hctz), and his cholesterol (taking atorvastatin, no side effects). Denies new complaints. Still not exercising but has been working on his diet some. Also having some ear itching and pain. About 1 month ago had some blood come out of his left ear.   Review of Systems  Constitutional: Negative for fever, activity change, appetite change and fatigue.  HENT: Positive for ear discharge and ear pain. Negative for postnasal drip and rhinorrhea.   Eyes: Negative.   Respiratory: Negative for cough, chest tightness, shortness of breath and wheezing.   Cardiovascular: Negative for chest pain, palpitations and leg swelling.  Gastrointestinal: Negative for abdominal pain, diarrhea, constipation, blood in stool and abdominal distention.  Genitourinary: Negative for hematuria.  Musculoskeletal: Negative.   Skin: Negative.   Neurological: Negative.   Psychiatric/Behavioral: Negative.       Objective:   Physical Exam  Constitutional: He is oriented to person, place, and time. He appears well-developed and well-nourished.  HENT:  Head: Normocephalic and atraumatic.  Ear canals red and small scratch left ear canal, bilateral TM normal.   Eyes: EOM are normal.  Neck: Normal range of motion.  Cardiovascular: Normal rate and regular rhythm.   Pulmonary/Chest: Effort normal and breath sounds normal. No respiratory distress. He has no wheezes.  Abdominal: Soft. He exhibits no distension. There is no tenderness. There is no rebound.  Musculoskeletal: He exhibits no edema.  Neurological: He is alert and oriented to person, place, and time. Coordination normal.  Skin: Skin is warm and  dry.  Psychiatric: He has a normal mood and affect.   Filed Vitals:   08/13/15 1316  BP: 162/78  Pulse: 87  Temp: 98.2 F (36.8 C)  TempSrc: Oral  Resp: 16  Height: 5\' 9"  (1.753 m)  Weight: 210 lb (95.255 kg)  SpO2: 97%      Assessment & Plan:  Tdap and pneumonia 13 given at visit.

## 2015-08-13 NOTE — Assessment & Plan Note (Signed)
BP close to goal on recheck and he does not want to adjust today. Diltiazem, imdur, metoprolol, valsartan/hctz and checking CMP. Adjust as needed.

## 2015-10-08 DIAGNOSIS — H25813 Combined forms of age-related cataract, bilateral: Secondary | ICD-10-CM | POA: Diagnosis not present

## 2015-10-08 DIAGNOSIS — H40033 Anatomical narrow angle, bilateral: Secondary | ICD-10-CM | POA: Diagnosis not present

## 2015-10-08 DIAGNOSIS — H1851 Endothelial corneal dystrophy: Secondary | ICD-10-CM | POA: Diagnosis not present

## 2015-10-08 DIAGNOSIS — H40013 Open angle with borderline findings, low risk, bilateral: Secondary | ICD-10-CM | POA: Diagnosis not present

## 2015-11-13 ENCOUNTER — Other Ambulatory Visit: Payer: Self-pay | Admitting: *Deleted

## 2015-11-13 MED ORDER — METFORMIN HCL 1000 MG PO TABS: ORAL_TABLET | ORAL | Status: DC

## 2015-12-03 DIAGNOSIS — E785 Hyperlipidemia, unspecified: Secondary | ICD-10-CM | POA: Diagnosis not present

## 2015-12-03 DIAGNOSIS — E669 Obesity, unspecified: Secondary | ICD-10-CM | POA: Diagnosis not present

## 2015-12-03 DIAGNOSIS — I119 Hypertensive heart disease without heart failure: Secondary | ICD-10-CM | POA: Diagnosis not present

## 2015-12-03 DIAGNOSIS — I25118 Atherosclerotic heart disease of native coronary artery with other forms of angina pectoris: Secondary | ICD-10-CM | POA: Diagnosis not present

## 2015-12-25 DIAGNOSIS — Z23 Encounter for immunization: Secondary | ICD-10-CM | POA: Diagnosis not present

## 2016-04-07 DIAGNOSIS — I119 Hypertensive heart disease without heart failure: Secondary | ICD-10-CM | POA: Diagnosis not present

## 2016-04-07 DIAGNOSIS — E785 Hyperlipidemia, unspecified: Secondary | ICD-10-CM | POA: Diagnosis not present

## 2016-04-07 DIAGNOSIS — I25118 Atherosclerotic heart disease of native coronary artery with other forms of angina pectoris: Secondary | ICD-10-CM | POA: Diagnosis not present

## 2016-04-07 DIAGNOSIS — R079 Chest pain, unspecified: Secondary | ICD-10-CM | POA: Diagnosis not present

## 2016-04-13 DIAGNOSIS — R0789 Other chest pain: Secondary | ICD-10-CM | POA: Diagnosis not present

## 2016-04-13 DIAGNOSIS — R079 Chest pain, unspecified: Secondary | ICD-10-CM | POA: Diagnosis not present

## 2016-04-13 DIAGNOSIS — E785 Hyperlipidemia, unspecified: Secondary | ICD-10-CM | POA: Diagnosis not present

## 2016-05-25 DIAGNOSIS — R350 Frequency of micturition: Secondary | ICD-10-CM | POA: Diagnosis not present

## 2016-05-25 DIAGNOSIS — N401 Enlarged prostate with lower urinary tract symptoms: Secondary | ICD-10-CM | POA: Diagnosis not present

## 2016-05-25 DIAGNOSIS — R3911 Hesitancy of micturition: Secondary | ICD-10-CM | POA: Diagnosis not present

## 2016-06-08 DIAGNOSIS — N401 Enlarged prostate with lower urinary tract symptoms: Secondary | ICD-10-CM | POA: Diagnosis not present

## 2016-06-08 DIAGNOSIS — N3281 Overactive bladder: Secondary | ICD-10-CM | POA: Diagnosis not present

## 2016-06-08 DIAGNOSIS — N3941 Urge incontinence: Secondary | ICD-10-CM | POA: Diagnosis not present

## 2016-06-15 DIAGNOSIS — N3281 Overactive bladder: Secondary | ICD-10-CM | POA: Diagnosis not present

## 2016-06-15 DIAGNOSIS — N401 Enlarged prostate with lower urinary tract symptoms: Secondary | ICD-10-CM | POA: Diagnosis not present

## 2016-06-15 DIAGNOSIS — N3941 Urge incontinence: Secondary | ICD-10-CM | POA: Diagnosis not present

## 2016-06-25 DIAGNOSIS — N3281 Overactive bladder: Secondary | ICD-10-CM | POA: Diagnosis not present

## 2016-06-25 DIAGNOSIS — R278 Other lack of coordination: Secondary | ICD-10-CM | POA: Diagnosis not present

## 2016-06-25 DIAGNOSIS — R3915 Urgency of urination: Secondary | ICD-10-CM | POA: Diagnosis not present

## 2016-06-25 DIAGNOSIS — M6281 Muscle weakness (generalized): Secondary | ICD-10-CM | POA: Diagnosis not present

## 2016-06-25 DIAGNOSIS — N3941 Urge incontinence: Secondary | ICD-10-CM | POA: Diagnosis not present

## 2016-06-25 DIAGNOSIS — R350 Frequency of micturition: Secondary | ICD-10-CM | POA: Diagnosis not present

## 2016-07-09 DIAGNOSIS — N3281 Overactive bladder: Secondary | ICD-10-CM | POA: Diagnosis not present

## 2016-07-09 DIAGNOSIS — R3912 Poor urinary stream: Secondary | ICD-10-CM | POA: Diagnosis not present

## 2016-07-09 DIAGNOSIS — M6281 Muscle weakness (generalized): Secondary | ICD-10-CM | POA: Diagnosis not present

## 2016-07-09 DIAGNOSIS — R3915 Urgency of urination: Secondary | ICD-10-CM | POA: Diagnosis not present

## 2016-07-09 DIAGNOSIS — R350 Frequency of micturition: Secondary | ICD-10-CM | POA: Diagnosis not present

## 2016-07-09 DIAGNOSIS — N393 Stress incontinence (female) (male): Secondary | ICD-10-CM | POA: Diagnosis not present

## 2016-07-27 DIAGNOSIS — M6281 Muscle weakness (generalized): Secondary | ICD-10-CM | POA: Diagnosis not present

## 2016-07-27 DIAGNOSIS — N393 Stress incontinence (female) (male): Secondary | ICD-10-CM | POA: Diagnosis not present

## 2016-07-27 DIAGNOSIS — R3915 Urgency of urination: Secondary | ICD-10-CM | POA: Diagnosis not present

## 2016-07-27 DIAGNOSIS — M62838 Other muscle spasm: Secondary | ICD-10-CM | POA: Diagnosis not present

## 2016-07-27 DIAGNOSIS — R3912 Poor urinary stream: Secondary | ICD-10-CM | POA: Diagnosis not present

## 2016-08-05 DIAGNOSIS — R3915 Urgency of urination: Secondary | ICD-10-CM | POA: Diagnosis not present

## 2016-08-05 DIAGNOSIS — R3912 Poor urinary stream: Secondary | ICD-10-CM | POA: Diagnosis not present

## 2016-08-05 DIAGNOSIS — M6281 Muscle weakness (generalized): Secondary | ICD-10-CM | POA: Diagnosis not present

## 2016-08-05 DIAGNOSIS — N393 Stress incontinence (female) (male): Secondary | ICD-10-CM | POA: Diagnosis not present

## 2016-08-08 ENCOUNTER — Other Ambulatory Visit: Payer: Self-pay | Admitting: Internal Medicine

## 2016-08-10 DIAGNOSIS — E669 Obesity, unspecified: Secondary | ICD-10-CM | POA: Diagnosis not present

## 2016-08-10 DIAGNOSIS — I25118 Atherosclerotic heart disease of native coronary artery with other forms of angina pectoris: Secondary | ICD-10-CM | POA: Diagnosis not present

## 2016-08-10 DIAGNOSIS — E785 Hyperlipidemia, unspecified: Secondary | ICD-10-CM | POA: Diagnosis not present

## 2016-08-10 DIAGNOSIS — I119 Hypertensive heart disease without heart failure: Secondary | ICD-10-CM | POA: Diagnosis not present

## 2016-08-11 ENCOUNTER — Other Ambulatory Visit: Payer: Self-pay | Admitting: Internal Medicine

## 2016-08-19 DIAGNOSIS — R350 Frequency of micturition: Secondary | ICD-10-CM | POA: Diagnosis not present

## 2016-08-19 DIAGNOSIS — M6281 Muscle weakness (generalized): Secondary | ICD-10-CM | POA: Diagnosis not present

## 2016-08-19 DIAGNOSIS — M62838 Other muscle spasm: Secondary | ICD-10-CM | POA: Diagnosis not present

## 2016-08-19 DIAGNOSIS — R3915 Urgency of urination: Secondary | ICD-10-CM | POA: Diagnosis not present

## 2016-08-19 DIAGNOSIS — R3912 Poor urinary stream: Secondary | ICD-10-CM | POA: Diagnosis not present

## 2016-08-19 DIAGNOSIS — N393 Stress incontinence (female) (male): Secondary | ICD-10-CM | POA: Diagnosis not present

## 2016-08-27 ENCOUNTER — Ambulatory Visit (INDEPENDENT_AMBULATORY_CARE_PROVIDER_SITE_OTHER): Payer: Medicare Other | Admitting: Internal Medicine

## 2016-08-27 ENCOUNTER — Encounter: Payer: Self-pay | Admitting: Internal Medicine

## 2016-08-27 ENCOUNTER — Other Ambulatory Visit (INDEPENDENT_AMBULATORY_CARE_PROVIDER_SITE_OTHER): Payer: Medicare Other

## 2016-08-27 VITALS — BP 160/98 | HR 100 | Temp 98.3°F | Resp 12 | Ht 69.0 in | Wt 213.0 lb

## 2016-08-27 DIAGNOSIS — E559 Vitamin D deficiency, unspecified: Secondary | ICD-10-CM | POA: Diagnosis not present

## 2016-08-27 DIAGNOSIS — Z1159 Encounter for screening for other viral diseases: Secondary | ICD-10-CM | POA: Diagnosis not present

## 2016-08-27 DIAGNOSIS — E785 Hyperlipidemia, unspecified: Secondary | ICD-10-CM

## 2016-08-27 DIAGNOSIS — E1142 Type 2 diabetes mellitus with diabetic polyneuropathy: Secondary | ICD-10-CM

## 2016-08-27 DIAGNOSIS — Z0000 Encounter for general adult medical examination without abnormal findings: Secondary | ICD-10-CM | POA: Diagnosis not present

## 2016-08-27 DIAGNOSIS — E538 Deficiency of other specified B group vitamins: Secondary | ICD-10-CM

## 2016-08-27 DIAGNOSIS — I10 Essential (primary) hypertension: Secondary | ICD-10-CM | POA: Diagnosis not present

## 2016-08-27 LAB — VITAMIN B12: Vitamin B-12: >1500 pg/mL — ABNORMAL HIGH (ref 211–911)

## 2016-08-27 LAB — LIPID PANEL
Cholesterol: 107 mg/dL (ref 0–200)
HDL: 53.2 mg/dL (ref >39.00)
LDL Cholesterol: 37 mg/dL (ref 0–99)
NonHDL: 53.33
Total CHOL/HDL Ratio: 2
Triglycerides: 82 mg/dL (ref 0.0–149.0)
VLDL: 16.4 mg/dL (ref 0.0–40.0)

## 2016-08-27 LAB — CBC
HCT: 41.3 % (ref 39.0–52.0)
Hemoglobin: 13.6 g/dL (ref 13.0–17.0)
MCHC: 32.9 g/dL (ref 30.0–36.0)
MCV: 100.2 fl — ABNORMAL HIGH (ref 78.0–100.0)
Platelets: 206 10*3/uL (ref 150.0–400.0)
RBC: 4.12 Mil/uL — ABNORMAL LOW (ref 4.22–5.81)
RDW: 14.4 % (ref 11.5–15.5)
WBC: 8.1 10*3/uL (ref 4.0–10.5)

## 2016-08-27 LAB — COMPREHENSIVE METABOLIC PANEL
ALT: 24 U/L (ref 0–53)
AST: 18 U/L (ref 0–37)
Albumin: 4.4 g/dL (ref 3.5–5.2)
Alkaline Phosphatase: 73 U/L (ref 39–117)
BUN: 18 mg/dL (ref 6–23)
CO2: 30 mEq/L (ref 19–32)
Calcium: 10 mg/dL (ref 8.4–10.5)
Chloride: 106 mEq/L (ref 96–112)
Creatinine, Ser: 1.2 mg/dL (ref 0.40–1.50)
GFR: 77.09 mL/min (ref >60.00)
Glucose, Bld: 119 mg/dL — ABNORMAL HIGH (ref 70–99)
Potassium: 4.2 mEq/L (ref 3.5–5.1)
Sodium: 142 mEq/L (ref 135–145)
Total Bilirubin: 0.3 mg/dL (ref 0.2–1.2)
Total Protein: 6.8 g/dL (ref 6.0–8.3)

## 2016-08-27 LAB — TSH: TSH: 1.15 u[IU]/mL (ref 0.35–4.50)

## 2016-08-27 LAB — VITAMIN D 25 HYDROXY (VIT D DEFICIENCY, FRACTURES): VITD: 107.36 ng/mL (ref 30.00–100.00)

## 2016-08-27 LAB — HEMOGLOBIN A1C: Hgb A1c MFr Bld: 6.7 % — ABNORMAL HIGH (ref 4.6–6.5)

## 2016-08-27 MED ORDER — PREDNISONE 20 MG PO TABS: 40.0000 mg | ORAL_TABLET | Freq: Every day | ORAL | 0 refills | Status: DC

## 2016-08-27 NOTE — Patient Instructions (Signed)
We are checking the labs today and will call you back.   We have sent in prednisone for the foot. Take 2 pills daily for 5 days. It is okay to use aleve or ibuprofen for the pain.    Plantar Fasciitis Rehab Ask your health care provider which exercises are safe for you. Do exercises exactly as told by your health care provider and adjust them as directed. It is normal to feel mild stretching, pulling, tightness, or discomfort as you do these exercises, but you should stop right away if you feel sudden pain or your pain gets worse. Do not begin these exercises until told by your health care provider. Stretching and range of motion exercises These exercises warm up your muscles and joints and improve the movement and flexibility of your foot. These exercises also help to relieve pain. Exercise A: Plantar fascia stretch   1. Sit with your left / right leg crossed over your opposite knee. 2. Hold your heel with one hand with that thumb near your arch. With your other hand, hold your toes and gently pull them back toward the top of your foot. You should feel a stretch on the bottom of your toes or your foot or both. 3. Hold this stretch for__________ seconds. 4. Slowly release your toes and return to the starting position. Repeat __________ times. Complete this exercise __________ times a day. Exercise B: Gastroc, standing   1. Stand with your hands against a wall. 2. Extend your left / right leg behind you, and bend your front knee slightly. 3. Keeping your heels on the floor and keeping your back knee straight, shift your weight toward the wall without arching your back. You should feel a gentle stretch in your left / right calf. 4. Hold this position for __________ seconds. Repeat __________ times. Complete this exercise __________ times a day. Exercise C: Soleus, standing  1. Stand with your hands against a wall. 2. Extend your left / right leg behind you, and bend your front knee  slightly. 3. Keeping your heels on the floor, bend your back knee and slightly shift your weight over the back leg. You should feel a gentle stretch deep in your calf. 4. Hold this position for __________ seconds. Repeat __________ times. Complete this exercise __________ times a day. Exercise D: Gastrocsoleus, standing  1. Stand with the ball of your left / right foot on a step. The ball of your foot is on the walking surface, right under your toes. 2. Keep your other foot firmly on the same step. 3. Hold onto the wall or a railing for balance. 4. Slowly lift your other foot, allowing your body weight to press your heel down over the edge of the step. You should feel a stretch in your left / right calf. 5. Hold this position for __________ seconds. 6. Return both feet to the step. 7. Repeat this exercise with a slight bend in your left / right knee. Repeat __________ times with your left / right knee straight and __________ times with your left / right knee bent. Complete this exercise __________ times a day. Balance exercise This exercise builds your balance and strength control of your arch to help take pressure off your plantar fascia. Exercise E: Single leg stand  1. Without shoes, stand near a railing or in a doorway. You may hold onto the railing or door frame as needed. 2. Stand on your left / right foot. Keep your big toe down on the  floor and try to keep your arch lifted. Do not let your foot roll inward. 3. Hold this position for __________ seconds. 4. If this exercise is too easy, you can try it with your eyes closed or while standing on a pillow. Repeat __________ times. Complete this exercise __________ times a day. This information is not intended to replace advice given to you by your health care provider. Make sure you discuss any questions you have with your health care provider. Document Released: 04/13/2005 Document Revised: 12/17/2015 Document Reviewed: 02/25/2015 Elsevier  Interactive Patient Education  2017 [place] Maintenance, Male A healthy lifestyle and preventive care is important for your health and wellness. Ask your health care provider about what schedule of regular examinations is right for you. What should I know about weight and diet?  Eat a Healthy Diet  Eat plenty of vegetables, fruits, whole grains, low-fat dairy products, and lean protein.  Do not eat a lot of foods high in solid fats, added sugars, or salt. Maintain a Healthy Weight  Regular exercise can help you achieve or maintain a healthy weight. You should:  Do at least 150 minutes of exercise each week. The exercise should increase your heart rate and make you sweat (moderate-intensity exercise).  Do strength-training exercises at least twice a week. Watch Your Levels of Cholesterol and Blood Lipids  Have your blood tested for lipids and cholesterol every 5 years starting at 69 years of age. If you are at high risk for heart disease, you should start having your blood tested when you are 70 years old. You may need to have your cholesterol levels checked more often if:  Your lipid or cholesterol levels are high.  You are older than 70 years of age.  You are at high risk for heart disease. What should I know about cancer screening? Many types of cancers can be detected early and may often be prevented. Lung Cancer  You should be screened every year for lung cancer if:  You are a current smoker who has smoked for at least 30 years.  You are a former smoker who has quit within the past 15 years.  Talk to your health care provider about your screening options, when you should start screening, and how often you should be screened. Colorectal Cancer  Routine colorectal cancer screening usually begins at 70 years of age and should be repeated every 5-10 years until you are 69 years old. You may need to be screened more often if early forms of precancerous polyps or  small growths are found. Your health care provider may recommend screening at an earlier age if you have risk factors for colon cancer.  Your health care provider may recommend using home test kits to check for hidden blood in the stool.  A small camera at the end of a tube can be used to examine your colon (sigmoidoscopy or colonoscopy). This checks for the earliest forms of colorectal cancer. Prostate and Testicular Cancer  Depending on your age and overall health, your health care provider may do certain tests to screen for prostate and testicular cancer.  Talk to your health care provider about any symptoms or concerns you have about testicular or prostate cancer. Skin Cancer  Check your skin from head to toe regularly.  Tell your health care provider about any new moles or changes in moles, especially if:  There is a change in a mole's size, shape, or color.  You have a mole  that is larger than a pencil eraser.  Always use sunscreen. Apply sunscreen liberally and repeat throughout the day.  Protect yourself by wearing long sleeves, pants, a wide-brimmed hat, and sunglasses when outside. What should I know about heart disease, diabetes, and high blood pressure?  If you are 52-82 years of age, have your blood pressure checked every 3-5 years. If you are 84 years of age or older, have your blood pressure checked every year. You should have your blood pressure measured twice-once when you are at a hospital or clinic, and once when you are not at a hospital or clinic. Record the average of the two measurements. To check your blood pressure when you are not at a hospital or clinic, you can use:  An automated blood pressure machine at a pharmacy.  A home blood pressure monitor.  Talk to your health care provider about your target blood pressure.  If you are between 37-15 years old, ask your health care provider if you should take aspirin to prevent heart disease.  Have regular  diabetes screenings by checking your fasting blood sugar level.  If you are at a normal weight and have a low risk for diabetes, have this test once every three years after the age of 70.  If you are overweight and have a high risk for diabetes, consider being tested at a younger age or more often.  A one-time screening for abdominal aortic aneurysm (AAA) by ultrasound is recommended for men aged 35-75 years who are current or former smokers. What should I know about preventing infection? Hepatitis B  If you have a higher risk for hepatitis B, you should be screened for this virus. Talk with your health care provider to find out if you are at risk for hepatitis B infection. Hepatitis C  Blood testing is recommended for:  Everyone born from [birth year] through 1964.  Anyone with known risk factors for hepatitis C. Sexually Transmitted Diseases (STDs)  You should be screened each year for STDs including gonorrhea and chlamydia if:  You are sexually active and are younger than 70 years of age.  You are older than 70 years of age and your health care provider tells you that you are at risk for this type of infection.  Your sexual activity has changed since you were last screened and you are at an increased risk for chlamydia or gonorrhea. Ask your health care provider if you are at risk.  Talk with your health care provider about whether you are at high risk of being infected with HIV. Your health care provider may recommend a prescription medicine to help prevent HIV infection. What else can I do?  Schedule regular health, dental, and eye exams.  Stay current with your vaccines (immunizations).  Do not use any tobacco products, such as cigarettes, chewing tobacco, and e-cigarettes. If you need help quitting, ask your health care provider.  Limit alcohol intake to no more than 2 drinks per day. One drink equals 12 ounces of beer, 5 ounces of wine, or 1 ounces of hard liquor.  Do not use  street drugs.  Do not share needles.  Ask your health care provider for help if you need support or information about quitting drugs.  Tell your health care provider if you often feel depressed.  Tell your health care provider if you have ever been abused or do not feel safe at home. This information is not intended to replace advice given to you by your health  care provider. Make sure you discuss any questions you have with your health care provider. Document Released: 10/10/2007 Document Revised: 12/11/2015 Document Reviewed: 01/15/2015 Elsevier Interactive Patient Education  2017 [employer].

## 2016-08-27 NOTE — Progress Notes (Signed)
Pre visit review using our clinic review tool, if applicable. No additional management support is needed unless otherwise documented below in the visit note. 

## 2016-08-27 NOTE — Progress Notes (Addendum)
   Subjective:    Patient ID: Christian [name], male    DOB: 07-12-1945, 71 y.o.   MRN: [id number]  HPI Here for medicare wellness, no new complaints. Please see A/P for status and treatment of chronic medical problems.   HPI #2: Here for follow up diabetes (taking metformin, on ARB and statin, not complicated, new numbness in his toes but denies pain or weakness or ulcers), cholesterol (taking lipitor 20 mg daily, no muscle aches or weakness, no chest pains or SOB), blood pressure (BP above goal today but controlled at home on diltiazem, imdur, metoprolol, valsartan/hctz, complicated by CAD, no chest pains or headaches or SOB).   Diet: DM since diabetic Physical activity: sedentary Depression/mood screen: negative Hearing: intact to whispered voice Visual acuity: grossly normal, performs annual eye exam  ADLs: capable Fall risk: none Home safety: good Cognitive evaluation: intact to orientation, naming, recall and repetition EOL planning: adv directives discussed  I have personally reviewed and have noted 1. The patient's medical and social history - reviewed today no changes 2. Their use of alcohol, tobacco or illicit drugs 3. Their current medications and supplements 4. The patient's functional ability including ADL's, fall risks, home safety risks and hearing or visual impairment. 5. Diet and physical activities 6. Evidence for depression or mood disorders 7. Care team reviewed and updated (available in snapshot)  Review of Systems  Constitutional: Positive for activity change and fatigue. Negative for appetite change, chills, fever and unexpected weight change.  HENT: Negative.   Eyes: Negative.   Respiratory: Negative for cough, chest tightness, shortness of breath and wheezing.   Cardiovascular: Negative.   Gastrointestinal: Negative for abdominal distention, abdominal pain, constipation, diarrhea and nausea.  Musculoskeletal: Positive for arthralgias and myalgias. Negative  for gait problem, joint swelling, neck pain and neck stiffness.  Skin: Negative.   Neurological: Positive for numbness. Negative for dizziness, tremors, syncope, facial asymmetry, weakness, light-headedness and headaches.      Objective:   Physical Exam  Constitutional: He is oriented to person, place, and time. He appears well-developed and well-nourished.  HENT:  Head: Normocephalic and atraumatic.  Eyes: EOM are normal.  Neck: Normal range of motion.  Cardiovascular: Normal rate and regular rhythm.   Pulmonary/Chest: Effort normal and breath sounds normal. No respiratory distress. He has no wheezes. He has no rales.  Abdominal: Soft. He exhibits no distension. There is no tenderness. There is no rebound.  Musculoskeletal: He exhibits no edema.  Neurological: He is alert and oriented to person, place, and time.  Skin: Skin is warm and dry.  See foot exam  Psychiatric: He has a normal mood and affect.   Vitals:   08/27/16 1252  BP: (!) 160/98  Pulse: 100  Resp: 12  Temp: 98.3 F (36.8 C)  TempSrc: Oral  SpO2: 98%  Weight: 213 lb (96.6 kg)  Height: 5\' 9"  (1.753 m)      Assessment & Plan:

## 2016-08-28 LAB — HEPATITIS C ANTIBODY: HCV Ab: NEGATIVE

## 2016-08-30 DIAGNOSIS — Z7189 Other specified counseling: Secondary | ICD-10-CM | POA: Insufficient documentation

## 2016-08-30 DIAGNOSIS — Z0000 Encounter for general adult medical examination without abnormal findings: Secondary | ICD-10-CM | POA: Insufficient documentation

## 2016-08-30 NOTE — Assessment & Plan Note (Signed)
Colonoscopy due in 2020, pneumonia and tetanus and flu up to date. Hep c screening done today. Counseled on sun safety and mole surveillance as well as home safety. Given 10 year screening recommendations.

## 2016-08-30 NOTE — Assessment & Plan Note (Signed)
BP above goal but he declines change today, taking diltiazem, imdur, valsartan/hctz and metoprolol. Checking CMP and adjust as needed. Complicated by CAD.

## 2016-08-30 NOTE — Assessment & Plan Note (Signed)
Taking metformin and on ARB and statin. Not complicated. Checking HgA1c and adjust as needed. Foot exam done at visit and he is up to date on eye exam but he could not remember the name of provider for [place] to get records.

## 2016-08-30 NOTE — Assessment & Plan Note (Signed)
Checking lipid panel and adjust for goal <100 (<70 ideal). Taking lipitor daily.

## 2016-09-01 DIAGNOSIS — R350 Frequency of micturition: Secondary | ICD-10-CM | POA: Diagnosis not present

## 2016-09-01 DIAGNOSIS — M6281 Muscle weakness (generalized): Secondary | ICD-10-CM | POA: Diagnosis not present

## 2016-09-01 DIAGNOSIS — R3912 Poor urinary stream: Secondary | ICD-10-CM | POA: Diagnosis not present

## 2016-09-01 DIAGNOSIS — N393 Stress incontinence (female) (male): Secondary | ICD-10-CM | POA: Diagnosis not present

## 2016-09-01 DIAGNOSIS — R3915 Urgency of urination: Secondary | ICD-10-CM | POA: Diagnosis not present

## 2016-09-01 DIAGNOSIS — M62838 Other muscle spasm: Secondary | ICD-10-CM | POA: Diagnosis not present

## 2016-09-07 ENCOUNTER — Other Ambulatory Visit: Payer: Self-pay | Admitting: Internal Medicine

## 2016-09-15 DIAGNOSIS — N393 Stress incontinence (female) (male): Secondary | ICD-10-CM | POA: Diagnosis not present

## 2016-09-15 DIAGNOSIS — R3912 Poor urinary stream: Secondary | ICD-10-CM | POA: Diagnosis not present

## 2016-09-15 DIAGNOSIS — R350 Frequency of micturition: Secondary | ICD-10-CM | POA: Diagnosis not present

## 2016-09-15 DIAGNOSIS — R3915 Urgency of urination: Secondary | ICD-10-CM | POA: Diagnosis not present

## 2016-09-15 DIAGNOSIS — M6281 Muscle weakness (generalized): Secondary | ICD-10-CM | POA: Diagnosis not present

## 2016-09-15 DIAGNOSIS — M62838 Other muscle spasm: Secondary | ICD-10-CM | POA: Diagnosis not present

## 2016-09-28 DIAGNOSIS — N3941 Urge incontinence: Secondary | ICD-10-CM | POA: Diagnosis not present

## 2016-09-28 DIAGNOSIS — R3915 Urgency of urination: Secondary | ICD-10-CM | POA: Diagnosis not present

## 2016-09-28 DIAGNOSIS — M62838 Other muscle spasm: Secondary | ICD-10-CM | POA: Diagnosis not present

## 2016-09-28 DIAGNOSIS — M6281 Muscle weakness (generalized): Secondary | ICD-10-CM | POA: Diagnosis not present

## 2016-09-28 DIAGNOSIS — N393 Stress incontinence (female) (male): Secondary | ICD-10-CM | POA: Diagnosis not present

## 2016-09-28 DIAGNOSIS — R3912 Poor urinary stream: Secondary | ICD-10-CM | POA: Diagnosis not present

## 2016-10-13 DIAGNOSIS — H25813 Combined forms of age-related cataract, bilateral: Secondary | ICD-10-CM | POA: Diagnosis not present

## 2016-10-13 DIAGNOSIS — E119 Type 2 diabetes mellitus without complications: Secondary | ICD-10-CM | POA: Diagnosis not present

## 2016-10-13 DIAGNOSIS — H43812 Vitreous degeneration, left eye: Secondary | ICD-10-CM | POA: Diagnosis not present

## 2016-10-13 DIAGNOSIS — H40013 Open angle with borderline findings, low risk, bilateral: Secondary | ICD-10-CM | POA: Diagnosis not present

## 2016-10-13 DIAGNOSIS — H1851 Endothelial corneal dystrophy: Secondary | ICD-10-CM | POA: Diagnosis not present

## 2016-10-13 LAB — HM DIABETES EYE EXAM

## 2016-10-20 DIAGNOSIS — N3941 Urge incontinence: Secondary | ICD-10-CM | POA: Diagnosis not present

## 2016-10-20 DIAGNOSIS — R3915 Urgency of urination: Secondary | ICD-10-CM | POA: Diagnosis not present

## 2016-10-20 DIAGNOSIS — N393 Stress incontinence (female) (male): Secondary | ICD-10-CM | POA: Diagnosis not present

## 2016-10-20 DIAGNOSIS — R3912 Poor urinary stream: Secondary | ICD-10-CM | POA: Diagnosis not present

## 2016-10-20 DIAGNOSIS — M62838 Other muscle spasm: Secondary | ICD-10-CM | POA: Diagnosis not present

## 2016-10-20 DIAGNOSIS — M6281 Muscle weakness (generalized): Secondary | ICD-10-CM | POA: Diagnosis not present

## 2016-11-05 ENCOUNTER — Encounter: Payer: Self-pay | Admitting: Internal Medicine

## 2016-11-05 NOTE — Progress Notes (Unsigned)
Results entered and sent to scan  

## 2016-11-09 DIAGNOSIS — N3941 Urge incontinence: Secondary | ICD-10-CM | POA: Diagnosis not present

## 2016-11-09 DIAGNOSIS — N3281 Overactive bladder: Secondary | ICD-10-CM | POA: Diagnosis not present

## 2016-11-09 DIAGNOSIS — M62838 Other muscle spasm: Secondary | ICD-10-CM | POA: Diagnosis not present

## 2016-11-09 DIAGNOSIS — M6281 Muscle weakness (generalized): Secondary | ICD-10-CM | POA: Diagnosis not present

## 2016-11-09 DIAGNOSIS — R3912 Poor urinary stream: Secondary | ICD-10-CM | POA: Diagnosis not present

## 2016-11-09 DIAGNOSIS — R3915 Urgency of urination: Secondary | ICD-10-CM | POA: Diagnosis not present

## 2016-11-19 DIAGNOSIS — E119 Type 2 diabetes mellitus without complications: Secondary | ICD-10-CM | POA: Diagnosis not present

## 2016-11-19 DIAGNOSIS — H43391 Other vitreous opacities, right eye: Secondary | ICD-10-CM | POA: Diagnosis not present

## 2016-11-19 DIAGNOSIS — H40013 Open angle with borderline findings, low risk, bilateral: Secondary | ICD-10-CM | POA: Diagnosis not present

## 2016-11-19 DIAGNOSIS — H25813 Combined forms of age-related cataract, bilateral: Secondary | ICD-10-CM | POA: Diagnosis not present

## 2016-11-19 DIAGNOSIS — H10413 Chronic giant papillary conjunctivitis, bilateral: Secondary | ICD-10-CM | POA: Diagnosis not present

## 2016-11-20 ENCOUNTER — Other Ambulatory Visit: Payer: Self-pay

## 2016-11-30 DIAGNOSIS — R3915 Urgency of urination: Secondary | ICD-10-CM | POA: Diagnosis not present

## 2016-11-30 DIAGNOSIS — N3941 Urge incontinence: Secondary | ICD-10-CM | POA: Diagnosis not present

## 2016-12-15 DIAGNOSIS — M6281 Muscle weakness (generalized): Secondary | ICD-10-CM | POA: Diagnosis not present

## 2016-12-15 DIAGNOSIS — R3912 Poor urinary stream: Secondary | ICD-10-CM | POA: Diagnosis not present

## 2016-12-15 DIAGNOSIS — R3915 Urgency of urination: Secondary | ICD-10-CM | POA: Diagnosis not present

## 2016-12-15 DIAGNOSIS — N393 Stress incontinence (female) (male): Secondary | ICD-10-CM | POA: Diagnosis not present

## 2016-12-15 DIAGNOSIS — M62838 Other muscle spasm: Secondary | ICD-10-CM | POA: Diagnosis not present

## 2016-12-15 DIAGNOSIS — N3941 Urge incontinence: Secondary | ICD-10-CM | POA: Diagnosis not present

## 2016-12-22 DIAGNOSIS — R350 Frequency of micturition: Secondary | ICD-10-CM | POA: Diagnosis not present

## 2016-12-22 DIAGNOSIS — N3941 Urge incontinence: Secondary | ICD-10-CM | POA: Diagnosis not present

## 2016-12-29 DIAGNOSIS — R350 Frequency of micturition: Secondary | ICD-10-CM | POA: Diagnosis not present

## 2016-12-29 DIAGNOSIS — N3941 Urge incontinence: Secondary | ICD-10-CM | POA: Diagnosis not present

## 2017-01-05 DIAGNOSIS — R350 Frequency of micturition: Secondary | ICD-10-CM | POA: Diagnosis not present

## 2017-01-05 DIAGNOSIS — N3941 Urge incontinence: Secondary | ICD-10-CM | POA: Diagnosis not present

## 2017-01-12 DIAGNOSIS — N3941 Urge incontinence: Secondary | ICD-10-CM | POA: Diagnosis not present

## 2017-01-12 DIAGNOSIS — R350 Frequency of micturition: Secondary | ICD-10-CM | POA: Diagnosis not present

## 2017-01-19 DIAGNOSIS — R3915 Urgency of urination: Secondary | ICD-10-CM | POA: Diagnosis not present

## 2017-01-19 DIAGNOSIS — M62838 Other muscle spasm: Secondary | ICD-10-CM | POA: Diagnosis not present

## 2017-01-19 DIAGNOSIS — M6281 Muscle weakness (generalized): Secondary | ICD-10-CM | POA: Diagnosis not present

## 2017-01-19 DIAGNOSIS — N393 Stress incontinence (female) (male): Secondary | ICD-10-CM | POA: Diagnosis not present

## 2017-01-19 DIAGNOSIS — R3912 Poor urinary stream: Secondary | ICD-10-CM | POA: Diagnosis not present

## 2017-01-19 DIAGNOSIS — N3941 Urge incontinence: Secondary | ICD-10-CM | POA: Diagnosis not present

## 2017-01-19 DIAGNOSIS — R350 Frequency of micturition: Secondary | ICD-10-CM | POA: Diagnosis not present

## 2017-01-26 DIAGNOSIS — R350 Frequency of micturition: Secondary | ICD-10-CM | POA: Diagnosis not present

## 2017-01-26 DIAGNOSIS — N3941 Urge incontinence: Secondary | ICD-10-CM | POA: Diagnosis not present

## 2017-02-02 DIAGNOSIS — R350 Frequency of micturition: Secondary | ICD-10-CM | POA: Diagnosis not present

## 2017-02-02 DIAGNOSIS — N3941 Urge incontinence: Secondary | ICD-10-CM | POA: Diagnosis not present

## 2017-02-08 DIAGNOSIS — Z23 Encounter for immunization: Secondary | ICD-10-CM | POA: Diagnosis not present

## 2017-02-09 DIAGNOSIS — R350 Frequency of micturition: Secondary | ICD-10-CM | POA: Diagnosis not present

## 2017-02-09 DIAGNOSIS — N3941 Urge incontinence: Secondary | ICD-10-CM | POA: Diagnosis not present

## 2017-02-16 DIAGNOSIS — M62838 Other muscle spasm: Secondary | ICD-10-CM | POA: Diagnosis not present

## 2017-02-16 DIAGNOSIS — R3915 Urgency of urination: Secondary | ICD-10-CM | POA: Diagnosis not present

## 2017-02-16 DIAGNOSIS — M6281 Muscle weakness (generalized): Secondary | ICD-10-CM | POA: Diagnosis not present

## 2017-02-16 DIAGNOSIS — R350 Frequency of micturition: Secondary | ICD-10-CM | POA: Diagnosis not present

## 2017-02-16 DIAGNOSIS — R3912 Poor urinary stream: Secondary | ICD-10-CM | POA: Diagnosis not present

## 2017-02-16 DIAGNOSIS — N393 Stress incontinence (female) (male): Secondary | ICD-10-CM | POA: Diagnosis not present

## 2017-02-16 DIAGNOSIS — N3941 Urge incontinence: Secondary | ICD-10-CM | POA: Diagnosis not present

## 2017-02-23 DIAGNOSIS — R3915 Urgency of urination: Secondary | ICD-10-CM | POA: Diagnosis not present

## 2017-02-23 DIAGNOSIS — R350 Frequency of micturition: Secondary | ICD-10-CM | POA: Diagnosis not present

## 2017-03-02 DIAGNOSIS — N3941 Urge incontinence: Secondary | ICD-10-CM | POA: Diagnosis not present

## 2017-03-02 DIAGNOSIS — R350 Frequency of micturition: Secondary | ICD-10-CM | POA: Diagnosis not present

## 2017-03-03 ENCOUNTER — Other Ambulatory Visit: Payer: Self-pay | Admitting: Internal Medicine

## 2017-03-09 DIAGNOSIS — N3941 Urge incontinence: Secondary | ICD-10-CM | POA: Diagnosis not present

## 2017-03-09 DIAGNOSIS — R3915 Urgency of urination: Secondary | ICD-10-CM | POA: Diagnosis not present

## 2017-03-09 DIAGNOSIS — R350 Frequency of micturition: Secondary | ICD-10-CM | POA: Diagnosis not present

## 2017-03-16 DIAGNOSIS — M6281 Muscle weakness (generalized): Secondary | ICD-10-CM | POA: Diagnosis not present

## 2017-03-16 DIAGNOSIS — N3941 Urge incontinence: Secondary | ICD-10-CM | POA: Diagnosis not present

## 2017-03-16 DIAGNOSIS — R3915 Urgency of urination: Secondary | ICD-10-CM | POA: Diagnosis not present

## 2017-03-16 DIAGNOSIS — M62838 Other muscle spasm: Secondary | ICD-10-CM | POA: Diagnosis not present

## 2017-03-16 DIAGNOSIS — R3912 Poor urinary stream: Secondary | ICD-10-CM | POA: Diagnosis not present

## 2017-03-16 DIAGNOSIS — R350 Frequency of micturition: Secondary | ICD-10-CM | POA: Diagnosis not present

## 2017-03-29 ENCOUNTER — Ambulatory Visit (INDEPENDENT_AMBULATORY_CARE_PROVIDER_SITE_OTHER): Payer: Medicare Other | Admitting: Internal Medicine

## 2017-03-29 ENCOUNTER — Other Ambulatory Visit (INDEPENDENT_AMBULATORY_CARE_PROVIDER_SITE_OTHER): Payer: Medicare Other

## 2017-03-29 ENCOUNTER — Encounter: Payer: Self-pay | Admitting: Internal Medicine

## 2017-03-29 VITALS — BP 140/88 | HR 92 | Temp 97.9°F | Ht 69.0 in | Wt 216.0 lb

## 2017-03-29 DIAGNOSIS — E1142 Type 2 diabetes mellitus with diabetic polyneuropathy: Secondary | ICD-10-CM | POA: Diagnosis not present

## 2017-03-29 DIAGNOSIS — R079 Chest pain, unspecified: Secondary | ICD-10-CM | POA: Diagnosis not present

## 2017-03-29 DIAGNOSIS — R0602 Shortness of breath: Secondary | ICD-10-CM

## 2017-03-29 LAB — CBC
HCT: 42.1 % (ref 39.0–52.0)
Hemoglobin: 13.8 g/dL (ref 13.0–17.0)
MCHC: 32.7 g/dL (ref 30.0–36.0)
MCV: 100.9 fl — ABNORMAL HIGH (ref 78.0–100.0)
Platelets: 225 10*3/uL (ref 150.0–400.0)
RBC: 4.17 Mil/uL — ABNORMAL LOW (ref 4.22–5.81)
RDW: 14.5 % (ref 11.5–15.5)
WBC: 8.8 10*3/uL (ref 4.0–10.5)

## 2017-03-29 LAB — BRAIN NATRIURETIC PEPTIDE: Pro B Natriuretic peptide (BNP): 10 pg/mL (ref 0.0–100.0)

## 2017-03-29 LAB — HEMOGLOBIN A1C: Hgb A1c MFr Bld: 6.8 % — ABNORMAL HIGH (ref 4.6–6.5)

## 2017-03-29 NOTE — Progress Notes (Signed)
   Subjective:    Patient ID: Christian [name], male    DOB: 1945/09/17, 71 y.o.   MRN: [id number]  HPI The patient is a 71 YO man coming in for worsening SOB with exertion and intermittent chest pains. He has not seen his cardiologist in almost 1 year (records not available for review). Called them and has visit in the next month. Cannot walk more than 100 feet without stopping for breath. Denies swelling in legs. Denies long travel recently. Not exercising due to fatigue and SOB. Had some evaluation with [place] about 6 months ago for mild fatigue but this is significantly worse. Does have chest tightness and some pain with activity or at rest. Known CAD with prior stenting. Resting seems to relieve some chest pain and sometimes he will take his nitro.   Review of Systems  Constitutional: Positive for activity change and fatigue.  HENT: Negative.   Eyes: Negative.   Respiratory: Positive for shortness of breath. Negative for cough and chest tightness.   Cardiovascular: Positive for chest pain. Negative for palpitations and leg swelling.  Gastrointestinal: Negative for abdominal distention, abdominal pain, constipation, diarrhea, nausea and vomiting.  Musculoskeletal: Negative.   Skin: Negative.   Neurological: Negative.   Psychiatric/Behavioral: Negative.       Objective:   Physical Exam  Constitutional: He is oriented to person, place, and time. He appears well-developed and well-nourished.  Overweight  HENT:  Head: Normocephalic and atraumatic.  Eyes: EOM are normal.  Neck: Normal range of motion.  Cardiovascular: Normal rate and regular rhythm.  Pulmonary/Chest: Effort normal and breath sounds normal. No respiratory distress. He has no wheezes. He has no rales.  Abdominal: Soft. Bowel sounds are normal. He exhibits no distension. There is no tenderness. There is no rebound.  Musculoskeletal: He exhibits no edema.  Neurological: He is alert and oriented to person, place, and time.  Coordination normal.  Skin: Skin is warm and dry.  Psychiatric: He has a normal mood and affect.   Vitals:   03/29/17 1534 03/29/17 1605  BP: (!) 170/90 140/88  Pulse: 92   Temp: 97.9 F (36.6 C)   TempSrc: Oral   SpO2: 99%   Weight: 216 lb (98 kg)   Height: 5\' 9"  (1.753 m)       Assessment & Plan:

## 2017-03-29 NOTE — Patient Instructions (Signed)
We are checking the blood work today and call you back with the results.   Call Dr. Einar Gip to see if they can get you in sooner for the chest pains.

## 2017-03-30 DIAGNOSIS — N3941 Urge incontinence: Secondary | ICD-10-CM | POA: Diagnosis not present

## 2017-03-30 LAB — COMPREHENSIVE METABOLIC PANEL
ALT: 32 U/L (ref 0–53)
AST: 22 U/L (ref 0–37)
Albumin: 4.7 g/dL (ref 3.5–5.2)
Alkaline Phosphatase: 74 U/L (ref 39–117)
BUN: 13 mg/dL (ref 6–23)
CO2: 30 mEq/L (ref 19–32)
Calcium: 10.3 mg/dL (ref 8.4–10.5)
Chloride: 103 mEq/L (ref 96–112)
Creatinine, Ser: 1.19 mg/dL (ref 0.40–1.50)
GFR: 77.7 mL/min (ref >60.00)
Glucose, Bld: 83 mg/dL (ref 70–99)
Potassium: 4.2 mEq/L (ref 3.5–5.1)
Sodium: 141 mEq/L (ref 135–145)
Total Bilirubin: 0.3 mg/dL (ref 0.2–1.2)
Total Protein: 7.1 g/dL (ref 6.0–8.3)

## 2017-03-30 LAB — D-DIMER, QUANTITATIVE (NOT AT ARMC): D-Dimer, Quant: 0.26 mcg/mL FEU (ref <0.50)

## 2017-03-31 ENCOUNTER — Encounter: Payer: Self-pay | Admitting: Internal Medicine

## 2017-03-31 NOTE — Assessment & Plan Note (Signed)
Needs HgA1c, polyneuropathy stable. Taking metformin and adjust as needed.

## 2017-03-31 NOTE — Assessment & Plan Note (Signed)
Checking d-dimer and BNP as well as CBC and CMP. Advised him to call his cardiologist for sooner appointment for the ongoing chest pain with exertion.

## 2017-04-13 DIAGNOSIS — N3941 Urge incontinence: Secondary | ICD-10-CM | POA: Diagnosis not present

## 2017-04-13 DIAGNOSIS — R3912 Poor urinary stream: Secondary | ICD-10-CM | POA: Diagnosis not present

## 2017-04-13 DIAGNOSIS — M6281 Muscle weakness (generalized): Secondary | ICD-10-CM | POA: Diagnosis not present

## 2017-04-13 DIAGNOSIS — M62838 Other muscle spasm: Secondary | ICD-10-CM | POA: Diagnosis not present

## 2017-04-13 DIAGNOSIS — R3915 Urgency of urination: Secondary | ICD-10-CM | POA: Diagnosis not present

## 2017-04-19 DIAGNOSIS — R350 Frequency of micturition: Secondary | ICD-10-CM | POA: Diagnosis not present

## 2017-04-19 DIAGNOSIS — N3941 Urge incontinence: Secondary | ICD-10-CM | POA: Diagnosis not present

## 2017-05-04 DIAGNOSIS — R351 Nocturia: Secondary | ICD-10-CM | POA: Diagnosis not present

## 2017-05-04 DIAGNOSIS — R32 Unspecified urinary incontinence: Secondary | ICD-10-CM | POA: Diagnosis not present

## 2017-05-11 DIAGNOSIS — R3912 Poor urinary stream: Secondary | ICD-10-CM | POA: Diagnosis not present

## 2017-05-11 DIAGNOSIS — R350 Frequency of micturition: Secondary | ICD-10-CM | POA: Diagnosis not present

## 2017-05-11 DIAGNOSIS — M62838 Other muscle spasm: Secondary | ICD-10-CM | POA: Diagnosis not present

## 2017-05-11 DIAGNOSIS — R3915 Urgency of urination: Secondary | ICD-10-CM | POA: Diagnosis not present

## 2017-05-11 DIAGNOSIS — M6281 Muscle weakness (generalized): Secondary | ICD-10-CM | POA: Diagnosis not present

## 2017-05-11 DIAGNOSIS — N393 Stress incontinence (female) (male): Secondary | ICD-10-CM | POA: Diagnosis not present

## 2017-06-08 DIAGNOSIS — R3912 Poor urinary stream: Secondary | ICD-10-CM | POA: Diagnosis not present

## 2017-06-08 DIAGNOSIS — M6281 Muscle weakness (generalized): Secondary | ICD-10-CM | POA: Diagnosis not present

## 2017-06-08 DIAGNOSIS — R3915 Urgency of urination: Secondary | ICD-10-CM | POA: Diagnosis not present

## 2017-06-08 DIAGNOSIS — R350 Frequency of micturition: Secondary | ICD-10-CM | POA: Diagnosis not present

## 2017-06-08 DIAGNOSIS — N393 Stress incontinence (female) (male): Secondary | ICD-10-CM | POA: Diagnosis not present

## 2017-06-08 DIAGNOSIS — M62838 Other muscle spasm: Secondary | ICD-10-CM | POA: Diagnosis not present

## 2017-06-08 DIAGNOSIS — R351 Nocturia: Secondary | ICD-10-CM | POA: Diagnosis not present

## 2017-06-29 DIAGNOSIS — R350 Frequency of micturition: Secondary | ICD-10-CM | POA: Diagnosis not present

## 2017-06-29 DIAGNOSIS — R351 Nocturia: Secondary | ICD-10-CM | POA: Diagnosis not present

## 2017-06-29 DIAGNOSIS — R32 Unspecified urinary incontinence: Secondary | ICD-10-CM | POA: Diagnosis not present

## 2017-06-29 DIAGNOSIS — N3941 Urge incontinence: Secondary | ICD-10-CM | POA: Diagnosis not present

## 2017-07-20 DIAGNOSIS — N3941 Urge incontinence: Secondary | ICD-10-CM | POA: Diagnosis not present

## 2017-08-10 DIAGNOSIS — N3941 Urge incontinence: Secondary | ICD-10-CM | POA: Diagnosis not present

## 2017-08-18 ENCOUNTER — Encounter: Payer: Self-pay | Admitting: Podiatry

## 2017-08-18 ENCOUNTER — Ambulatory Visit (INDEPENDENT_AMBULATORY_CARE_PROVIDER_SITE_OTHER): Payer: Medicare Other | Admitting: Podiatry

## 2017-08-18 ENCOUNTER — Encounter

## 2017-08-18 VITALS — BP 160/98 | HR 105

## 2017-08-18 DIAGNOSIS — L600 Ingrowing nail: Secondary | ICD-10-CM

## 2017-08-18 DIAGNOSIS — B351 Tinea unguium: Secondary | ICD-10-CM

## 2017-08-18 DIAGNOSIS — M79676 Pain in unspecified toe(s): Secondary | ICD-10-CM | POA: Diagnosis not present

## 2017-08-18 DIAGNOSIS — E1142 Type 2 diabetes mellitus with diabetic polyneuropathy: Secondary | ICD-10-CM

## 2017-08-18 DIAGNOSIS — L03031 Cellulitis of right toe: Secondary | ICD-10-CM

## 2017-08-18 MED ORDER — CEPHALEXIN 500 MG PO CAPS: 500.0000 mg | ORAL_CAPSULE | Freq: Two times a day (BID) | ORAL | 0 refills | Status: DC

## 2017-08-18 NOTE — Addendum Note (Signed)
Addended byDeidre Ala, Magan Winnett L on: 08/18/2017 10:42 AM   Modules accepted: Orders

## 2017-08-18 NOTE — Patient Instructions (Addendum)
After Care Instructions  1) Soak foot in soapy sudsy water for 15 minutes after removing the surgical bandage.  2) Re-bandage surgical site after the soaks with neosporin, a sterile 2x2 gauze pad and tape.  3) Perform this soak twice a day following redressing area as above.  4) Return to office in 1 week for re-evaluation.  5) Take Advil for pain medication as needed. If given an antibiotic, finish completely.  6) 2nd week-peroxide washes are recommended instead of soaks. Also air drying is recommended. 

## 2017-08-18 NOTE — Progress Notes (Addendum)
.   This patient presents the office stating he is having severe pain along the inside border right big toe.  . This pain has been present for over 6 days.  He says that whenever he wears shoes. It causes significant pain and discomfort.  He says he wore shoes that do not put pressure on the toe  and he was pain free..  . Patient states that he is taking BC powders for pain.  Patient is also taking Plavix.  Patient is also diabetic on metformin.  . Finally, he says his nails are thick and painful and desires evaluation of all these nails.  General Appearance  Alert, conversant and in no acute stress.  Vascular  Dorsalis pedis  are palpable  bilaterally.   Marland Kitchen Posterior tibial pulses are absent due to swelling in his ankles.Capillary return is within normal limits  bilaterally. Temperature is within normal limits  bilaterally.  Neurologic  Senn-Weinstein monofilament wire test diminished   bilaterally. Muscle power within normal limits bilaterally.  Nails Thick disfigured discolored nails with subungual debris  from hallux to fifth toes bilaterally. No evidence of bacterial infection or drainage bilaterally. . There is marked incurvation noted along the medial and lateral borders of the right great toenail.  There is drainage noted at the medial aspect of the right great toenail.  No evidence of granulation tissue.Marland Kitchen Pincer right hallux toenail.  Orthopedic  No limitations of motion of motion feet .  No crepitus or effusions noted.  No bony pathology or digital deformities noted.  Skin  normotropic skin with no porokeratosis noted bilaterally.  No signs of infections or ulcers noted.    Paronychia right hallux.  Onychomycosis  B/L  IE  Debride onychomycotic nails  X 10.  Treatment options and alternatives discussed.  Recommended permanent phenol matrixectomy and patient agreed.  Right hallux  was prepped with alcohol and a toe block of 3cc of 2% lidocaine plain was administered in a digital toe block. .   The toe was then prepped with betadine solution .  The offending nail border was then excised and matrix tissue exposed.  Phenol was then applied to the matrix tissue followed by an alcohol wash.  Antibiotic ointment and a dry sterile dressing was applied.  The patient was dispensed instructions for aftercare. Surgicell was used to promote clotting.  Prescribe cephalexin.  RTC 1 week.  The surgical site had  pus noted along the whole medial border.  The medial aspect of nail border was unattached thus causing the infection.     Gardiner Barefoot DPM

## 2017-08-25 ENCOUNTER — Other Ambulatory Visit: Payer: Self-pay | Admitting: Internal Medicine

## 2017-08-25 ENCOUNTER — Ambulatory Visit (INDEPENDENT_AMBULATORY_CARE_PROVIDER_SITE_OTHER): Payer: Medicare Other | Admitting: Podiatry

## 2017-08-25 ENCOUNTER — Encounter: Payer: Self-pay | Admitting: Podiatry

## 2017-08-25 DIAGNOSIS — E1142 Type 2 diabetes mellitus with diabetic polyneuropathy: Secondary | ICD-10-CM

## 2017-08-25 DIAGNOSIS — D689 Coagulation defect, unspecified: Secondary | ICD-10-CM

## 2017-08-25 DIAGNOSIS — Z09 Encounter for follow-up examination after completed treatment for conditions other than malignant neoplasm: Secondary | ICD-10-CM

## 2017-08-25 NOTE — Progress Notes (Signed)
This patient returns to the office following nail surgery one week ago.  The patient says toe has been soaked and bandaged as directed.  There has been improvement of the toe since the surgery has been performed. The patient presents for continued evaluation and treatment. Patient is on plavix.  GENERAL APPEARANCE: Alert, conversant. Appropriately groomed. No acute distress.  VASCULAR: Pedal pulses palpable at  [facility] and PT bilateral.  Capillary refill time is immediate to all digits,  Normal temperature gradient.    NEUROLOGIC: sensation is normal to 5.07 monofilament at 5/5 sites bilateral.  Light touch is intact bilateral, Muscle strength normal.  MUSCULOSKELETAL: acceptable muscle strength, tone and stability bilateral.  Intrinsic muscluature intact bilateral.  Rectus appearance of foot and digits noted bilateral.   DERMATOLOGIC: skin color, texture, and turgor are within normal limits.  No preulcerative lesions or ulcers  are seen, no interdigital maceration noted.   NAILS  There is necrotic tissue along the nail groove  In the absence of redness swelling and pain.  DX  S/p nail surgery  ROV  Home instructions were discussed.  Patient to call the office if there are any questions or concerns.   Gardiner Barefoot DPM

## 2017-08-30 DIAGNOSIS — R5383 Other fatigue: Secondary | ICD-10-CM | POA: Diagnosis not present

## 2017-08-30 DIAGNOSIS — I119 Hypertensive heart disease without heart failure: Secondary | ICD-10-CM | POA: Diagnosis not present

## 2017-08-30 DIAGNOSIS — I25118 Atherosclerotic heart disease of native coronary artery with other forms of angina pectoris: Secondary | ICD-10-CM | POA: Diagnosis not present

## 2017-08-30 DIAGNOSIS — R21 Rash and other nonspecific skin eruption: Secondary | ICD-10-CM | POA: Diagnosis not present

## 2017-08-31 DIAGNOSIS — N3941 Urge incontinence: Secondary | ICD-10-CM | POA: Diagnosis not present

## 2017-09-17 DIAGNOSIS — I10 Essential (primary) hypertension: Secondary | ICD-10-CM | POA: Diagnosis not present

## 2017-09-17 DIAGNOSIS — R0602 Shortness of breath: Secondary | ICD-10-CM | POA: Diagnosis not present

## 2017-09-21 DIAGNOSIS — N3941 Urge incontinence: Secondary | ICD-10-CM | POA: Diagnosis not present

## 2017-09-21 DIAGNOSIS — R350 Frequency of micturition: Secondary | ICD-10-CM | POA: Diagnosis not present

## 2017-10-03 ENCOUNTER — Other Ambulatory Visit: Payer: Self-pay | Admitting: Podiatry

## 2017-10-07 NOTE — Telephone Encounter (Signed)
Pt states he was given an antibiotic. Dr. Prudence Davidson states if he has refilled the rx again he needs to be seen. Transferred pt to schedule.

## 2017-10-08 ENCOUNTER — Ambulatory Visit (INDEPENDENT_AMBULATORY_CARE_PROVIDER_SITE_OTHER): Payer: Medicare Other | Admitting: Podiatry

## 2017-10-08 ENCOUNTER — Encounter: Payer: Self-pay | Admitting: Podiatry

## 2017-10-08 DIAGNOSIS — D689 Coagulation defect, unspecified: Secondary | ICD-10-CM

## 2017-10-08 DIAGNOSIS — L03031 Cellulitis of right toe: Secondary | ICD-10-CM | POA: Diagnosis not present

## 2017-10-08 NOTE — Progress Notes (Signed)
This patient returns to the office following nail surgery .  He has developed on hard  Skin lesion at the base of the inside border right big toe.  He says it is occasionally painful.  He presents for continued evaluation and treatment. Patient is on plavix.  GENERAL APPEARANCE: Alert, conversant. Appropriately groomed. No acute distress.  VASCULAR: Pedal pulses palpable at  [facility] and PT bilateral.  Capillary refill time is immediate to all digits,  Normal temperature gradient.    NEUROLOGIC: sensation is normal to 5.07 monofilament at 5/5 sites bilateral.  Light touch is intact bilateral, Muscle strength normal.  MUSCULOSKELETAL: acceptable muscle strength, tone and stability bilateral.  Intrinsic muscluature intact bilateral.  Rectus appearance of foot and digits noted bilateral.   DERMATOLOGIC: skin color, texture, and turgor are within normal limits.  No preulcerative lesions or ulcers  are seen, no interdigital maceration noted.   NAILS  There is necrotic tissue along the nail groove  In the absence of redness swelling and pain. There is a hard granular skin lesion at the base of the medial border right hallux.  No pus or drainage noted.  DX  Paronychia right hallux  Granulation tissue right hallux.  ROV  Home instructions were discussed.  Patient to call the office if there are any questions or concerns. Skin lesion removed and bandaged with neosporin/DSD.  Told to peroxide toe.  RTC preventative foot care services.  Take antibiotics.   Gardiner Barefoot DPM

## 2017-10-13 DIAGNOSIS — H10413 Chronic giant papillary conjunctivitis, bilateral: Secondary | ICD-10-CM | POA: Diagnosis not present

## 2017-10-13 DIAGNOSIS — H40013 Open angle with borderline findings, low risk, bilateral: Secondary | ICD-10-CM | POA: Diagnosis not present

## 2017-10-13 DIAGNOSIS — H04123 Dry eye syndrome of bilateral lacrimal glands: Secondary | ICD-10-CM | POA: Diagnosis not present

## 2017-10-13 DIAGNOSIS — H25813 Combined forms of age-related cataract, bilateral: Secondary | ICD-10-CM | POA: Diagnosis not present

## 2017-10-13 DIAGNOSIS — E119 Type 2 diabetes mellitus without complications: Secondary | ICD-10-CM | POA: Diagnosis not present

## 2017-10-13 LAB — HM DIABETES EYE EXAM

## 2017-10-18 ENCOUNTER — Ambulatory Visit (INDEPENDENT_AMBULATORY_CARE_PROVIDER_SITE_OTHER): Payer: Medicare Other | Admitting: *Deleted

## 2017-10-18 VITALS — BP 193/92 | HR 88 | Resp 18 | Ht 69.0 in | Wt 210.0 lb

## 2017-10-18 DIAGNOSIS — Z0000 Encounter for general adult medical examination without abnormal findings: Secondary | ICD-10-CM

## 2017-10-18 NOTE — Progress Notes (Signed)
Subjective:   Christian [name] is a 72 y.o. male who presents for Medicare Annual/Subsequent preventive examination.  Review of Systems:  No ROS.  Medicare Wellness Visit. Additional risk factors are reflected in the social history.  Cardiac Risk Factors include: advanced age (>60men, >6 women);diabetes mellitus;dyslipidemia;hypertension;male gender Sleep patterns: feels rested on waking, gets up 2 times nightly to void and sleeps 6-7 hours nightly.    Home Safety/Smoke Alarms: Feels safe in home. Smoke alarms in place.  Living environment; residence and Firearm Safety: 1-story house/ trailer, no firearms. Lives with wife, no needs for DME, good support system Seat Belt Safety/Bike Helmet: Wears seat belt.      Objective:    Vitals: BP (!) 193/92   Pulse 88   Resp 18   Ht 5\' 9"  (1.753 m)   Wt 210 lb (95.3 kg)   SpO2 99%   BMI 31.01 kg/m   Body mass index is 31.01 kg/m.  Advanced Directives 10/18/2017 09/27/2014 09/27/2014 09/20/2014 02/27/2014 04/04/2012  Does Patient Have a Medical Advance Directive? Yes Yes - Yes Yes Patient has advance directive, copy not in chart  Type of Advance Directive [place];Living will Living will - Living will [place];Living will Living will  Does patient want to make changes to medical advance directive? - No - Patient declined - - No - Patient declined -  Copy of [place] in Chart? No - copy requested No - copy requested (No Data) No - copy requested No - copy requested (No Data)  Pre-existing out of facility DNR order (yellow form or pink MOST form) - - - - - No    Tobacco Social History   Tobacco Use  Smoking Status Former Smoker  . Packs/day: 0.50  . Years: 20.00  . Pack years: 10.00  . Types: Cigarettes  . Last attempt to quit: 03/28/1990  . Years since quitting: 27.5  Smokeless Tobacco Never Used     Counseling given: Not Answered  Past Medical History:  Diagnosis Date  .  BPH (benign prostatic hypertrophy)   . Coronary artery disease   . Diabetes mellitus without complication ([place])   . Dry eyes left  . Hiatal hernia   . Hyperlipidemia   . Hypertension   . Incomplete bladder emptying   . Nocturia   . 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 SOB (shortness of breath) on exertion    Past Surgical History:  Procedure Laterality Date  . CARDIAC CATHETERIZATION    . CORONARY STENT PLACEMENT  02/27/2014   distal rt/pd coronary       dr Einar Gip  . CYSTO/ BLADDER BIOPSY'S/ CAUTHERIZATION  01-14-2004  DR Gaynelle Arabian  . LEFT HEART CATHETERIZATION WITH CORONARY ANGIOGRAM N/A 02/27/2014   Procedure: LEFT HEART CATHETERIZATION WITH CORONARY ANGIOGRAM;  Surgeon: Laverda Page, MD;  [facility];  Service: Cardiovascular;  Laterality: N/A;  . PERCUTANEOUS CORONARY STENT INTERVENTION (PCI-S)  02/27/2014   Procedure: PERCUTANEOUS CORONARY STENT INTERVENTION (PCI-S);  Surgeon: Laverda Page, MD;  Location: [facility] CATH LAB;  Service: Cardiovascular;;  rt PDA  3.0/28mm Promus stent  . TRANSURETHRAL RESECTION OF PROSTATE  04/04/2012   Procedure: TRANSURETHRAL RESECTION OF THE PROSTATE WITH GYRUS INSTRUMENTS;  Surgeon: Ailene Rud, MD;  Location: [facility];  Service: Urology;  Laterality: N/A;  . TRANSURETHRAL RESECTION OF PROSTATE  N/A 09/27/2014   Procedure: TRANSURETHRAL RESECTION OF THE PROSTATE ;  Surgeon: Carolan Clines, MD;  Location: [facility] ORS;  Service: Urology;  Laterality: N/A;  . UPPER GASTROINTESTINAL ENDOSCOPY     Family History  Problem Relation Age of Onset  . Diabetes Mother   . Diabetes Father   . Diabetes Brother    Social History   Socioeconomic History  . Marital status: Married    Spouse name: Not on file  . Number of children: 2  . Years of education: Not on file  . Highest education level: Not on file    Occupational History  . Not on file  Social Needs  . Financial resource strain: Not hard at all  . Food insecurity:    Worry: Never true    Inability: Never true  . Transportation needs:    Medical: No    Non-medical: No  Tobacco Use  . Smoking status: Former Smoker    Packs/day: 0.50    Years: 20.00    Pack years: 10.00    Types: Cigarettes    Last attempt to quit: 03/28/1990    Years since quitting: 27.5  . Smokeless tobacco: Never Used  Substance and Sexual Activity  . Alcohol use: Yes    Alcohol/week: 0.0 oz    Comment: 1 beer week  . Drug use: No  . Sexual activity: Not Currently  Lifestyle  . Physical activity:    Days per week: 0 days    Minutes per session: 0 min  . Stress: Only a little  Relationships  . Social connections:    Talks on phone: More than three times a week    Gets together: More than three times a week    Attends religious service: More than 4 times per year    Active member of club or organization: Yes    Attends meetings of clubs or organizations: More than 4 times per year    Relationship status: Married  Other Topics Concern  . Not on file  Social History Narrative  . Not on file    Outpatient Encounter Medications as of 10/18/2017  Medication Sig  . atorvastatin (LIPITOR) 20 MG tablet Take 20 mg by mouth every evening.   . clopidogrel (PLAVIX) 75 MG tablet Take 1 tablet (75 mg total) by mouth daily with breakfast.  . cromolyn (OPTICROM) 4 % ophthalmic solution 1 DROP IN BOTH EYES FOUR TIMES A DAY AS NEEDED  . diltiazem (TIAZAC) 240 MG 24 hr capsule 1 (ONE) CAPSUL CAPSULE ER 24H CAPSULE ER 24HR DAILY  . isosorbide mononitrate (IMDUR) 60 MG 24 hr tablet Take 60 mg by mouth daily.  . metFORMIN (GLUCOPHAGE) 1000 MG tablet TAKE 1 TABLET TWICE A DAY WITH A MEAL  . metoprolol succinate (TOPROL-XL) 100 MG 24 hr tablet Take 100 mg by mouth every morning.   . nitroGLYCERIN (NITROSTAT) 0.4 MG SL tablet Place 0.4 mg under the tongue every 5 (five)  minutes as needed for chest pain.  Marland Kitchen omeprazole (PRILOSEC) 40 MG capsule Take 40 mg by mouth daily.  . valsartan-hydrochlorothiazide (DIOVAN-HCT) 320-25 MG per tablet Take 1 tablet by mouth daily.  . [DISCONTINUED] cephALEXin (KEFLEX) 500 MG capsule TAKE 1 CAPSULE BY MOUTH TWICE A DAY (Patient not taking: Reported on 10/18/2017)   No facility-administered encounter medications on file as of 10/18/2017.     Activities of Daily Living In your present state of health, do you have any difficulty performing the following activities: 10/18/2017  Hearing? N  Vision? N  Difficulty concentrating or making decisions? N  Walking or climbing stairs? N  Dressing or bathing? N  Doing errands, shopping? N  Preparing Food and eating ? N  Using the Toilet? N  In the past six months, have you accidently leaked urine? N  Do you have problems with loss of bowel control? N  Managing your Medications? N  Managing your Finances? N  Housekeeping or managing your Housekeeping? N  Some recent data might be hidden    Patient Care Team: Despina Hick, MD as PCP - General (Cardiology) Gardiner Barefoot, DPM as Consulting Physician (Podiatry) McKenzie, Candee Furbish, MD as Consulting Physician (Urology)   Assessment:   This is a routine wellness examination for [place]. Physical assessment deferred to PCP.   Exercise Activities and Dietary recommendations Current Exercise Habits: Structured exercise class, Time (Minutes): 45, Frequency (Times/Week): 3, Weekly Exercise (Minutes/Week): 135, Intensity: Mild, Exercise limited by: None identified  Diet (meal preparation, eat out, water intake, caffeinated beverages, dairy products, fruits and vegetables): in general, an "unhealthy" diet   Reviewed heart healthy and diabetic diet, Encouraged patient to increase daily water and fluid intake. Relevant patient education assigned to patient using Emmi. Diet education was attached to patient's AVS.  Goals    . Patient Stated      Maintain current health status, stay as healthy and as independent as possible.        Fall Risk Fall Risk  10/18/2017 03/29/2017 11/20/2016 02/11/2015  Falls in the past year? No No No No  Comment - - Emmi Telephone Survey: data to providers prior to load -    Depression Screen PHQ 2/9 Scores 10/18/2017 03/29/2017 02/11/2015  PHQ - 2 Score 0 1 0    Cognitive Function MMSE - Mini Mental State Exam 10/18/2017  Orientation to time 5  Orientation to Place 5  Registration 3  Attention/ Calculation 3  Recall 2  Language- name 2 objects 2  Language- repeat 1  Language- follow 3 step command 3  Language- read & follow direction 1  Write a sentence 1  Copy design 1  Total score 27        Immunization History  Administered Date(s) Administered  . Influenza,inj,Quad PF,6+ Mos 02/28/2014, 02/11/2015  . Influenza-Unspecified 03/11/2017  . Pneumococcal Conjugate-13 08/13/2015  . Pneumococcal Polysaccharide-23 02/28/2014  . Tdap 08/13/2015   Screening Tests Health Maintenance  Topic Date Due  . FOOT EXAM  08/27/2017  . HEMOGLOBIN A1C  09/27/2017  . OPHTHALMOLOGY EXAM  10/13/2017  . INFLUENZA VACCINE  11/25/2017  . COLONOSCOPY  09/27/2018  . TETANUS/TDAP  08/12/2025  . Hepatitis C Screening  Completed  . PNA vac Low Risk Adult  Completed      Plan:    Discussed with PCP patient's elevated blood pressure readings. Patient denies feeling dizzy, tingly, having headaches, or chest pain. An appointment was scheduled with PCP 10/19/17 to evaluate patient.   Nurse will request records regarding eye exam and diabetic foot exam.   Continue doing brain stimulating activities (puzzles, reading, adult coloring books, staying active) to keep memory sharp.   Start to eat heart healthy diet (full of fruits, vegetables, whole grains, lean protein, water--limit salt, fat, and sugar intake) and increase physical activity as tolerated.  I have personally reviewed and noted the following  in the patient's chart:   . Medical and social history . Use of alcohol, tobacco or illicit drugs  . Current medications and supplements . Functional ability and  status . Nutritional status . Physical activity . Advanced directives . List of other physicians . Vitals . Screenings to include cognitive, depression, and falls . Referrals and appointments  In addition, I have reviewed and discussed with patient certain preventive protocols, quality metrics, and best practice recommendations. A written personalized care plan for preventive services as well as general preventive health recommendations were provided to patient.     Michiel Cowboy, RN  10/18/2017

## 2017-10-18 NOTE — Patient Instructions (Addendum)
Continue doing brain stimulating activities (puzzles, reading, adult coloring books, staying active) to keep memory sharp.   Start to eat heart healthy diet (full of fruits, vegetables, whole grains, lean protein, water--limit salt, fat, and sugar intake) and increase physical activity as tolerated.  Christian [name] , Thank you for taking time to come for your Medicare Wellness Visit. I appreciate your ongoing commitment to your health goals. Please review the following plan we discussed and let me know if I can assist you in the future.   These are the goals we discussed: Goals    . Patient Stated     Maintain current health status, stay as healthy and as independent as possible.        This is a list of the screening recommended for you and due dates:  Health Maintenance  Topic Date Due  . Complete foot exam   08/27/2017  . Hemoglobin A1C  09/27/2017  . Eye exam for diabetics  10/13/2017  . Flu Shot  11/25/2017  . Colon Cancer Screening  09/27/2018  . Tetanus Vaccine  08/12/2025  .  Hepatitis C: One time screening is recommended by Center for Disease Control  (CDC) for  adults born from [birth year] through 1964.   Completed  . Pneumonia vaccines  Completed    Fat and Cholesterol Restricted Diet Getting too much fat and cholesterol in your diet may cause health problems. Following this diet helps keep your fat and cholesterol at normal levels. This can keep you from getting sick. What types of fat should I choose?  Choose monosaturated and polyunsaturated fats. These are found in foods such as olive oil, canola oil, flaxseeds, walnuts, almonds, and seeds.  Eat more omega-3 fats. Good choices include salmon, mackerel, sardines, tuna, flaxseed oil, and ground flaxseeds.  Limit saturated fats. These are in animal products such as meats, butter, and cream. They can also be in plant products such as palm oil, palm kernel oil, and coconut oil.  Avoid foods with partially hydrogenated oils in  them. These contain trans fats. Examples of foods that have trans fats are stick margarine, some tub margarines, cookies, crackers, and other baked goods. What general guidelines do I need to follow?  Check food labels. Look for the words "trans fat" and "saturated fat."  When preparing a meal: ? Fill half of your plate with vegetables and green salads. ? Fill one fourth of your plate with whole grains. Look for the word "whole" as the first word in the ingredient list. ? Fill one fourth of your plate with lean protein foods.  Eat more foods that have fiber, like apples, carrots, beans, peas, and barley.  Eat more home-cooked foods. Eat less at restaurants and buffets.  Limit or avoid alcohol.  Limit foods high in starch and sugar.  Limit fried foods.  Cook foods without frying them. Baking, boiling, grilling, and broiling are all great options.  Lose weight if you are overweight. Losing even a small amount of weight can help your overall health. It can also help prevent diseases such as diabetes and heart disease. What foods can I eat? Grains Whole grains, such as whole wheat or whole grain breads, crackers, cereals, and pasta. Unsweetened oatmeal, bulgur, barley, quinoa, or brown rice. Corn or whole wheat flour tortillas. Vegetables Fresh or frozen vegetables (raw, steamed, roasted, or grilled). Green salads. Fruits All fresh, canned (in natural juice), or frozen fruits. Meat and Other Protein Products Ground beef (85% or leaner), grass-fed beef, or  beef trimmed of fat. Skinless chicken or [place]. Ground chicken or [place]. Pork trimmed of fat. All fish and seafood. Eggs. Dried beans, peas, or lentils. Unsalted nuts or seeds. Unsalted canned or dry beans. Dairy Low-fat dairy products, such as skim or 1% milk, 2% or reduced-fat cheeses, low-fat ricotta or cottage cheese, or plain low-fat yogurt. Fats and Oils Tub margarines without trans fats. Light or reduced-fat mayonnaise and  salad dressings. Avocado. Olive, canola, sesame, or safflower oils. Natural peanut or almond butter (choose ones without added sugar and oil). The items listed above may not be a complete list of recommended foods or beverages. Contact your dietitian for more options. What foods are not recommended? Grains White bread. White pasta. White rice. Cornbread. Bagels, pastries, and croissants. Crackers that contain trans fat. Vegetables White potatoes. Corn. Creamed or fried vegetables. Vegetables in a cheese sauce. Fruits Dried fruits. Canned fruit in light or heavy syrup. Fruit juice. Meat and Other Protein Products Fatty cuts of meat. Ribs, chicken wings, bacon, sausage, bologna, salami, chitterlings, fatback, hot dogs, bratwurst, and packaged luncheon meats. Liver and organ meats. Dairy Whole or 2% milk, cream, half-and-half, and cream cheese. Whole milk cheeses. Whole-fat or sweetened yogurt. Full-fat cheeses. Nondairy creamers and whipped toppings. Processed cheese, cheese spreads, or cheese curds. Sweets and Desserts Corn syrup, sugars, honey, and molasses. Candy. Jam and jelly. Syrup. Sweetened cereals. Cookies, pies, cakes, donuts, muffins, and ice cream. Fats and Oils Butter, stick margarine, lard, shortening, ghee, or bacon fat. Coconut, palm kernel, or palm oils. Beverages Alcohol. Sweetened drinks (such as sodas, lemonade, and fruit drinks or punches). The items listed above may not be a complete list of foods and beverages to avoid. Contact your dietitian for more information. This information is not intended to replace advice given to you by your health care provider. Make sure you discuss any questions you have with your health care provider. Document Released: 10/13/2011 Document Revised: 12/19/2015 Document Reviewed: 07/13/2013 Elsevier Interactive Patient Education  2018 [place] DASH stands for "Dietary Approaches to Stop Hypertension." The DASH eating  plan is a healthy eating plan that has been shown to reduce high blood pressure (hypertension). It may also reduce your risk for type 2 diabetes, heart disease, and stroke. The DASH eating plan may also help with weight loss. What are tips for following this plan? General guidelines  Avoid eating more than 2,300 mg (milligrams) of salt (sodium) a day. If you have hypertension, you may need to reduce your sodium intake to 1,500 mg a day.  Limit alcohol intake to no more than 1 drink a day for nonpregnant women and 2 drinks a day for men. One drink equals 12 oz of beer, 5 oz of Braniyah Besse, or 1 oz of hard liquor.  Work with your health care provider to maintain a healthy body weight or to lose weight. Ask what an ideal weight is for you.  Get at least 30 minutes of exercise that causes your heart to beat faster (aerobic exercise) most days of the week. Activities may include walking, swimming, or biking.  Work with your health care provider or diet and nutrition specialist (dietitian) to adjust your eating plan to your individual calorie needs. Reading food labels  Check food labels for the amount of sodium per serving. Choose foods with less than 5 percent of the Daily Value of sodium. Generally, foods with less than 300 mg of sodium per serving fit into this eating plan.  To  find whole grains, look for the word "whole" as the first word in the ingredient list. Shopping  Buy products labeled as "low-sodium" or "no salt added."  Buy fresh foods. Avoid canned foods and premade or frozen meals. Cooking  Avoid adding salt when cooking. Use salt-free seasonings or herbs instead of table salt or sea salt. Check with your health care provider or pharmacist before using salt substitutes.  Do not fry foods. Cook foods using healthy methods such as baking, boiling, grilling, and broiling instead.  Cook with heart-healthy oils, such as olive, canola, soybean, or sunflower oil. Meal planning   Eat a  balanced diet that includes: ? 5 or more servings of fruits and vegetables each day. At each meal, try to fill half of your plate with fruits and vegetables. ? Up to 6-8 servings of whole grains each day. ? Less than 6 oz of lean meat, poultry, or fish each day. A 3-oz serving of meat is about the same size as a deck of cards. One egg equals 1 oz. ? 2 servings of low-fat dairy each day. ? A serving of nuts, seeds, or beans 5 times each week. ? Heart-healthy fats. Healthy fats called Omega-3 fatty acids are found in foods such as flaxseeds and coldwater fish, like sardines, salmon, and mackerel.  Limit how much you eat of the following: ? Canned or prepackaged foods. ? Food that is high in trans fat, such as fried foods. ? Food that is high in saturated fat, such as fatty meat. ? Sweets, desserts, sugary drinks, and other foods with added sugar. ? Full-fat dairy products.  Do not salt foods before eating.  Try to eat at least 2 vegetarian meals each week.  Eat more home-cooked food and less restaurant, buffet, and fast food.  When eating at a restaurant, ask that your food be prepared with less salt or no salt, if possible. What foods are recommended? The items listed may not be a complete list. Talk with your dietitian about what dietary choices are best for you. Grains Whole-grain or whole-wheat bread. Whole-grain or whole-wheat pasta. Brown rice. [place] Morrow. Bulgur. Whole-grain and low-sodium cereals. Pita bread. Low-fat, low-sodium crackers. Whole-wheat flour tortillas. Vegetables Fresh or frozen vegetables (raw, steamed, roasted, or grilled). Low-sodium or reduced-sodium tomato and vegetable juice. Low-sodium or reduced-sodium tomato sauce and tomato paste. Low-sodium or reduced-sodium canned vegetables. Fruits All fresh, dried, or frozen fruit. Canned fruit in natural juice (without added sugar). Meat and other protein foods Skinless chicken or [place]. Ground chicken or  [place]. Pork with fat trimmed off. Fish and seafood. Egg whites. Dried beans, peas, or lentils. Unsalted nuts, nut butters, and seeds. Unsalted canned beans. Lean cuts of beef with fat trimmed off. Low-sodium, lean deli meat. Dairy Low-fat (1%) or fat-free (skim) milk. Fat-free, low-fat, or reduced-fat cheeses. Nonfat, low-sodium ricotta or cottage cheese. Low-fat or nonfat yogurt. Low-fat, low-sodium cheese. Fats and oils Soft margarine without trans fats. Vegetable oil. Low-fat, reduced-fat, or light mayonnaise and salad dressings (reduced-sodium). Canola, safflower, olive, soybean, and sunflower oils. Avocado. Seasoning and other foods Herbs. Spices. Seasoning mixes without salt. Unsalted popcorn and pretzels. Fat-free sweets. What foods are not recommended? The items listed may not be a complete list. Talk with your dietitian about what dietary choices are best for you. Grains Baked goods made with fat, such as croissants, muffins, or some breads. Dry pasta or rice meal packs. Vegetables Creamed or fried vegetables. Vegetables in a cheese sauce. Regular canned vegetables (not  low-sodium or reduced-sodium). Regular canned tomato sauce and paste (not low-sodium or reduced-sodium). Regular tomato and vegetable juice (not low-sodium or reduced-sodium). Angie Fava. Olives. Fruits Canned fruit in a light or heavy syrup. Fried fruit. Fruit in cream or butter sauce. Meat and other protein foods Fatty cuts of meat. Ribs. Fried meat. Berniece Salines. Sausage. Bologna and other processed lunch meats. Salami. Fatback. Hotdogs. Bratwurst. Salted nuts and seeds. Canned beans with added salt. Canned or smoked fish. Whole eggs or egg yolks. Chicken or [place] with skin. Dairy Whole or 2% milk, cream, and half-and-half. Whole or full-fat cream cheese. Whole-fat or sweetened yogurt. Full-fat cheese. Nondairy creamers. Whipped toppings. Processed cheese and cheese spreads. Fats and oils Butter. Stick margarine. Lard.  Shortening. Ghee. Bacon fat. Tropical oils, such as coconut, palm kernel, or palm oil. Seasoning and other foods Salted popcorn and pretzels. Onion salt, garlic salt, seasoned salt, table salt, and sea salt. Worcestershire sauce. Tartar sauce. Barbecue sauce. Teriyaki sauce. Soy sauce, including reduced-sodium. Steak sauce. Canned and packaged gravies. Fish sauce. Oyster sauce. Cocktail sauce. Horseradish that you find on the shelf. Ketchup. Mustard. Meat flavorings and tenderizers. Bouillon cubes. Hot sauce and Tabasco sauce. Premade or packaged marinades. Premade or packaged taco seasonings. Relishes. Regular salad dressings. Where to find more information:  National Heart, Lung, and [place]: https://wilson-eaton.com/  American Heart Association: www.heart.org Summary  The DASH eating plan is a healthy eating plan that has been shown to reduce high blood pressure (hypertension). It may also reduce your risk for type 2 diabetes, heart disease, and stroke.  With the DASH eating plan, you should limit salt (sodium) intake to 2,300 mg a day. If you have hypertension, you may need to reduce your sodium intake to 1,500 mg a day.  When on the DASH eating plan, aim to eat more fresh fruits and vegetables, whole grains, lean proteins, low-fat dairy, and heart-healthy fats.  Work with your health care provider or diet and nutrition specialist (dietitian) to adjust your eating plan to your individual calorie needs. This information is not intended to replace advice given to you by your health care provider. Make sure you discuss any questions you have with your health care provider. Document Released: 04/02/2011 Document Revised: 04/06/2016 Document Reviewed: 04/06/2016 Elsevier Interactive Patient Education  [employer].

## 2017-10-19 ENCOUNTER — Encounter: Payer: Self-pay | Admitting: Internal Medicine

## 2017-10-19 ENCOUNTER — Other Ambulatory Visit (INDEPENDENT_AMBULATORY_CARE_PROVIDER_SITE_OTHER): Payer: Medicare Other

## 2017-10-19 ENCOUNTER — Ambulatory Visit (INDEPENDENT_AMBULATORY_CARE_PROVIDER_SITE_OTHER): Payer: Medicare Other | Admitting: Internal Medicine

## 2017-10-19 VITALS — BP 150/82 | HR 91 | Temp 97.6°F | Ht 69.0 in | Wt 210.0 lb

## 2017-10-19 DIAGNOSIS — I10 Essential (primary) hypertension: Secondary | ICD-10-CM

## 2017-10-19 DIAGNOSIS — R5383 Other fatigue: Secondary | ICD-10-CM | POA: Diagnosis not present

## 2017-10-19 DIAGNOSIS — E538 Deficiency of other specified B group vitamins: Secondary | ICD-10-CM | POA: Diagnosis not present

## 2017-10-19 DIAGNOSIS — E559 Vitamin D deficiency, unspecified: Secondary | ICD-10-CM

## 2017-10-19 DIAGNOSIS — E1142 Type 2 diabetes mellitus with diabetic polyneuropathy: Secondary | ICD-10-CM | POA: Diagnosis not present

## 2017-10-19 LAB — COMPREHENSIVE METABOLIC PANEL
ALT: 34 U/L (ref 0–53)
AST: 22 U/L (ref 0–37)
Albumin: 4.3 g/dL (ref 3.5–5.2)
Alkaline Phosphatase: 78 U/L (ref 39–117)
BUN: 17 mg/dL (ref 6–23)
CO2: 26 mEq/L (ref 19–32)
Calcium: 9.6 mg/dL (ref 8.4–10.5)
Chloride: 101 mEq/L (ref 96–112)
Creatinine, Ser: 1.19 mg/dL (ref 0.40–1.50)
GFR: 77.58 mL/min (ref >60.00)
Glucose, Bld: 172 mg/dL — ABNORMAL HIGH (ref 70–99)
Potassium: 3.7 mEq/L (ref 3.5–5.1)
Sodium: 138 mEq/L (ref 135–145)
Total Bilirubin: 0.3 mg/dL (ref 0.2–1.2)
Total Protein: 6.7 g/dL (ref 6.0–8.3)

## 2017-10-19 LAB — CBC
HCT: 42.1 % (ref 39.0–52.0)
Hemoglobin: 14.1 g/dL (ref 13.0–17.0)
MCHC: 33.6 g/dL (ref 30.0–36.0)
MCV: 99.7 fl (ref 78.0–100.0)
Platelets: 213 10*3/uL (ref 150.0–400.0)
RBC: 4.22 Mil/uL (ref 4.22–5.81)
RDW: 14.3 % (ref 11.5–15.5)
WBC: 7.7 10*3/uL (ref 4.0–10.5)

## 2017-10-19 LAB — LIPID PANEL
Cholesterol: 111 mg/dL (ref 0–200)
HDL: 47.8 mg/dL (ref >39.00)
NonHDL: 62.77
Total CHOL/HDL Ratio: 2
Triglycerides: 221 mg/dL — ABNORMAL HIGH (ref 0.0–149.0)
VLDL: 44.2 mg/dL — ABNORMAL HIGH (ref 0.0–40.0)

## 2017-10-19 LAB — LDL CHOLESTEROL, DIRECT: Direct LDL: 41 mg/dL

## 2017-10-19 LAB — VITAMIN D 25 HYDROXY (VIT D DEFICIENCY, FRACTURES): VITD: 95.53 ng/mL (ref 30.00–100.00)

## 2017-10-19 LAB — HEMOGLOBIN A1C: Hgb A1c MFr Bld: 6.8 % — ABNORMAL HIGH (ref 4.6–6.5)

## 2017-10-19 LAB — VITAMIN B12: Vitamin B-12: 936 pg/mL — ABNORMAL HIGH (ref 211–911)

## 2017-10-19 LAB — TSH: TSH: 1.73 u[IU]/mL (ref 0.35–4.50)

## 2017-10-19 NOTE — Assessment & Plan Note (Signed)
Needs HgA1c check, counseling done today as the patient feels he was never told about diabetes and the etiology.

## 2017-10-19 NOTE — Assessment & Plan Note (Signed)
Checking thyroid, vitamin levels, CBC, CMP.

## 2017-10-19 NOTE — Progress Notes (Signed)
   Subjective:    Patient ID: Christian [name], male    DOB: Feb 22, 1946, 72 y.o.   MRN: [id number]  HPI The patient is a 72 YO man coming in for follow up of his blood pressure (yesterday was severely elevated without symptoms around 190-200/100). He is feeling well today and thinks he may have been stressed out yesterday. No dietary changes and no increase in sodium. He has been taking his medications as prescribed and no changes to dosing recently. Denies chest pains or SOB. He is still feeling fatigued more than usual. This has been present in the past but it is worsening over the last several months. Is sleeping well and denies depression or anxiety.   Review of Systems  Constitutional: Positive for fatigue. Negative for activity change, appetite change, diaphoresis, fever and unexpected weight change.  HENT: Negative.   Eyes: Negative.   Respiratory: Negative for cough, chest tightness and shortness of breath.   Cardiovascular: Negative for chest pain, palpitations and leg swelling.  Gastrointestinal: Negative for abdominal distention, abdominal pain, constipation, diarrhea, nausea and vomiting.  Musculoskeletal: Negative.   Skin: Negative.   Neurological: Negative.   Psychiatric/Behavioral: Negative.       Objective:   Physical Exam  Constitutional: He is oriented to person, place, and time. He appears well-developed and well-nourished.  overweight  HENT:  Head: Normocephalic and atraumatic.  Eyes: EOM are normal.  Neck: Normal range of motion.  Cardiovascular: Normal rate and regular rhythm.  Pulmonary/Chest: Effort normal and breath sounds normal. No respiratory distress. He has no wheezes. He has no rales.  Abdominal: Soft. Bowel sounds are normal. He exhibits no distension. There is no tenderness. There is no rebound.  Musculoskeletal: He exhibits no edema.  Neurological: He is alert and oriented to person, place, and time. Coordination normal.  Skin: Skin is warm and dry.    Psychiatric: He has a normal mood and affect.   Vitals:   10/19/17 1036  BP: (!) 150/82  Pulse: 91  Temp: 97.6 F (36.4 C)  TempSrc: Oral  SpO2: 96%  Weight: 210 lb (95.3 kg)  Height: 5\' 9"  (1.753 m)      Assessment & Plan:

## 2017-10-19 NOTE — Patient Instructions (Signed)
Diabetes Mellitus and Nutrition When you have diabetes (diabetes mellitus), it is very important to have healthy eating habits because your blood sugar (glucose) levels are greatly affected by what you eat and drink. Eating healthy foods in the appropriate amounts, at about the same times every day, can help you:  Control your blood glucose.  Lower your risk of heart disease.  Improve your blood pressure.  Reach or maintain a healthy weight.  Every person with diabetes is different, and each person has different needs for a meal plan. Your health care provider may recommend that you work with a diet and nutrition specialist (dietitian) to make a meal plan that is best for you. Your meal plan may vary depending on factors such as:  The calories you need.  The medicines you take.  Your weight.  Your blood glucose, blood pressure, and cholesterol levels.  Your activity level.  Other health conditions you have, such as heart or kidney disease.  How do carbohydrates affect me? Carbohydrates affect your blood glucose level more than any other type of food. Eating carbohydrates naturally increases the amount of glucose in your blood. Carbohydrate counting is a method for keeping track of how many carbohydrates you eat. Counting carbohydrates is important to keep your blood glucose at a healthy level, especially if you use insulin or take certain oral diabetes medicines. It is important to know how many carbohydrates you can safely have in each meal. This is different for every person. Your dietitian can help you calculate how many carbohydrates you should have at each meal and for snack. Foods that contain carbohydrates include:  Bread, cereal, rice, pasta, and crackers.  Potatoes and corn.  Peas, beans, and lentils.  Milk and yogurt.  Fruit and juice.  Desserts, such as cakes, cookies, ice cream, and candy.  How does alcohol affect me? Alcohol can cause a sudden decrease in blood  glucose (hypoglycemia), especially if you use insulin or take certain oral diabetes medicines. Hypoglycemia can be a life-threatening condition. Symptoms of hypoglycemia (sleepiness, dizziness, and confusion) are similar to symptoms of having too much alcohol. If your health care provider says that alcohol is safe for you, follow these guidelines:  Limit alcohol intake to no more than 1 drink per day for nonpregnant women and 2 drinks per day for men. One drink equals 12 oz of beer, 5 oz of wine, or 1 oz of hard liquor.  Do not drink on an empty stomach.  Keep yourself hydrated with water, diet soda, or unsweetened iced tea.  Keep in mind that regular soda, juice, and other mixers may contain a lot of sugar and must be counted as carbohydrates.  What are tips for following this plan? Reading food labels  Start by checking the serving size on the label. The amount of calories, carbohydrates, fats, and other nutrients listed on the label are based on one serving of the food. Many foods contain more than one serving per package.  Check the total grams (g) of carbohydrates in one serving. You can calculate the number of servings of carbohydrates in one serving by dividing the total carbohydrates by 15. For example, if a food has 30 g of total carbohydrates, it would be equal to 2 servings of carbohydrates.  Check the number of grams (g) of saturated and trans fats in one serving. Choose foods that have low or no amount of these fats.  Check the number of milligrams (mg) of sodium in one serving. Most people   should limit total sodium intake to less than 2,300 mg per day.  Always check the nutrition information of foods labeled as "low-fat" or "nonfat". These foods may be higher in added sugar or refined carbohydrates and should be avoided.  Talk to your dietitian to identify your daily goals for nutrients listed on the label. Shopping  Avoid buying canned, premade, or processed foods. These  foods tend to be high in fat, sodium, and added sugar.  Shop around the outside edge of the grocery store. This includes fresh fruits and vegetables, bulk grains, fresh meats, and fresh dairy. Cooking  Use low-heat cooking methods, such as baking, instead of high-heat cooking methods like deep frying.  Cook using healthy oils, such as olive, canola, or sunflower oil.  Avoid cooking with butter, cream, or high-fat meats. Meal planning  Eat meals and snacks regularly, preferably at the same times every day. Avoid going long periods of time without eating.  Eat foods high in fiber, such as fresh fruits, vegetables, beans, and whole grains. Talk to your dietitian about how many servings of carbohydrates you can eat at each meal.  Eat 4-6 ounces of lean protein each day, such as lean meat, chicken, fish, eggs, or tofu. 1 ounce is equal to 1 ounce of meat, chicken, or fish, 1 egg, or 1/4 cup of tofu.  Eat some foods each day that contain healthy fats, such as avocado, nuts, seeds, and fish. Lifestyle   Check your blood glucose regularly.  Exercise at least 30 minutes 5 or more days each week, or as told by your health care provider.  Take medicines as told by your health care provider.  Do not use any products that contain nicotine or tobacco, such as cigarettes and e-cigarettes. If you need help quitting, ask your health care provider.  Work with a counselor or diabetes educator to identify strategies to manage stress and any emotional and social challenges. What are some questions to ask my health care provider?  Do I need to meet with a diabetes educator?  Do I need to meet with a dietitian?  What number can I call if I have questions?  When are the best times to check my blood glucose? Where to find more information:  American Diabetes Association: diabetes.org/food-and-fitness/food  Academy of Nutrition and Dietetics:  www.eatright.org/resources/health/diseases-and-conditions/diabetes  National Institute of Diabetes and Digestive and Kidney Diseases (NIH): www.niddk.nih.gov/health-information/diabetes/overview/diet-eating-physical-activity Summary  A healthy meal plan will help you control your blood glucose and maintain a healthy lifestyle.  Working with a diet and nutrition specialist (dietitian) can help you make a meal plan that is best for you.  Keep in mind that carbohydrates and alcohol have immediate effects on your blood glucose levels. It is important to count carbohydrates and to use alcohol carefully. This information is not intended to replace advice given to you by your health care provider. Make sure you discuss any questions you have with your health care provider. Document Released: 01/08/2005 Document Revised: 05/18/2016 Document Reviewed: 05/18/2016 Elsevier Interactive Patient Education  2018 [employer] Inc.  

## 2017-10-19 NOTE — Assessment & Plan Note (Signed)
BP is controlled on diltiazem, imdur, valsartan/hctz. Checking CMP and adjust as needed.

## 2017-10-21 NOTE — Progress Notes (Signed)
Medical screening examination/treatment/procedure(s) were performed by non-physician practitioner and as supervising physician I was immediately available for consultation/collaboration. I agree with above. Elizabeth A Crawford, MD 

## 2017-10-25 ENCOUNTER — Encounter: Payer: Self-pay | Admitting: Internal Medicine

## 2017-10-25 DIAGNOSIS — N3941 Urge incontinence: Secondary | ICD-10-CM | POA: Diagnosis not present

## 2017-10-25 NOTE — Progress Notes (Signed)
Abstracted and sent to scan  

## 2017-11-10 ENCOUNTER — Ambulatory Visit (INDEPENDENT_AMBULATORY_CARE_PROVIDER_SITE_OTHER): Payer: Medicare Other | Admitting: Podiatry

## 2017-11-10 ENCOUNTER — Encounter: Payer: Self-pay | Admitting: Podiatry

## 2017-11-10 DIAGNOSIS — M79675 Pain in left toe(s): Secondary | ICD-10-CM

## 2017-11-10 DIAGNOSIS — E1142 Type 2 diabetes mellitus with diabetic polyneuropathy: Secondary | ICD-10-CM

## 2017-11-10 DIAGNOSIS — M79674 Pain in right toe(s): Secondary | ICD-10-CM

## 2017-11-10 DIAGNOSIS — B351 Tinea unguium: Secondary | ICD-10-CM

## 2017-11-10 DIAGNOSIS — D689 Coagulation defect, unspecified: Secondary | ICD-10-CM

## 2017-11-10 NOTE — Progress Notes (Signed)
Complaint:  Visit Type: Patient returns to my office for continued preventative foot care services. Complaint: Patient states" my nails have grown long and thick and become painful to walk and wear shoes" Patient is taking plavix.. The patient presents for preventative foot care services. No changes to ROS  Podiatric Exam: Vascular: dorsalis pedis and posterior tibial pulses are palpable bilateral. Capillary return is immediate. Temperature gradient is WNL. Skin turgor WNL  Sensorium: Normal Semmes Weinstein monofilament test. Normal tactile sensation bilaterally  Nail Exam: Pt has thick disfigured discolored nails with subungual debris noted bilateral entire nail hallux through fifth toenails  Except hallux toe nail left foot.  Healing medial border right hallux. Ulcer Exam: There is no evidence of ulcer or pre-ulcerative changes or infection. Orthopedic Exam: Muscle tone and strength are WNL. No limitations in general ROM. No crepitus or effusions noted. Foot type and digits show no abnormalities. Bony prominences are unremarkable. Skin: No Porokeratosis. No infection or ulcers  Diagnosis:  Onychomycosis, , Pain in right toe, pain in left toes  Treatment & Plan Procedures and Treatment: Consent by patient was obtained for treatment procedures.   Debridement of mycotic and hypertrophic toenails, 1 through 5 bilateral and clearing of subungual debris. No ulceration, no infection noted.  Return Visit-Office Procedure: Patient instructed to return to the office for a follow up visit 3 months for continued evaluation and treatment.    Gardiner Barefoot DPM

## 2017-11-16 DIAGNOSIS — N3941 Urge incontinence: Secondary | ICD-10-CM | POA: Diagnosis not present

## 2017-12-07 DIAGNOSIS — R350 Frequency of micturition: Secondary | ICD-10-CM | POA: Diagnosis not present

## 2017-12-28 DIAGNOSIS — N3941 Urge incontinence: Secondary | ICD-10-CM | POA: Diagnosis not present

## 2018-01-17 DIAGNOSIS — Z23 Encounter for immunization: Secondary | ICD-10-CM | POA: Diagnosis not present

## 2018-01-18 DIAGNOSIS — N3941 Urge incontinence: Secondary | ICD-10-CM | POA: Diagnosis not present

## 2018-01-18 DIAGNOSIS — R350 Frequency of micturition: Secondary | ICD-10-CM | POA: Diagnosis not present

## 2018-02-01 DIAGNOSIS — R350 Frequency of micturition: Secondary | ICD-10-CM | POA: Diagnosis not present

## 2018-02-01 DIAGNOSIS — M6281 Muscle weakness (generalized): Secondary | ICD-10-CM | POA: Diagnosis not present

## 2018-02-01 DIAGNOSIS — M6289 Other specified disorders of muscle: Secondary | ICD-10-CM | POA: Diagnosis not present

## 2018-02-01 DIAGNOSIS — R351 Nocturia: Secondary | ICD-10-CM | POA: Diagnosis not present

## 2018-02-01 DIAGNOSIS — N3941 Urge incontinence: Secondary | ICD-10-CM | POA: Diagnosis not present

## 2018-02-07 ENCOUNTER — Ambulatory Visit (INDEPENDENT_AMBULATORY_CARE_PROVIDER_SITE_OTHER): Payer: Medicare Other | Admitting: Podiatry

## 2018-02-07 ENCOUNTER — Encounter: Payer: Self-pay | Admitting: Podiatry

## 2018-02-07 DIAGNOSIS — B351 Tinea unguium: Secondary | ICD-10-CM | POA: Diagnosis not present

## 2018-02-07 DIAGNOSIS — M79675 Pain in left toe(s): Secondary | ICD-10-CM

## 2018-02-07 DIAGNOSIS — D689 Coagulation defect, unspecified: Secondary | ICD-10-CM

## 2018-02-07 DIAGNOSIS — M79674 Pain in right toe(s): Secondary | ICD-10-CM | POA: Diagnosis not present

## 2018-02-07 NOTE — Progress Notes (Signed)
Subjective:  Patient ID: Christian [name], male    DOB: 21-Jan-1946,  MRN: [id number]  Chief Complaint  Patient presents with  . Nail Problem    trim    72 y.o. male presents with the above complaint.  Reports painfully elongated nails to both feet. Reports ingrowing nail right great toe.  Review of Systems: Negative except as noted in the HPI. Denies N/V/F/Ch.  Past Medical History:  Diagnosis Date  . BPH (benign prostatic hypertrophy)   . Coronary artery disease   . Diabetes mellitus without complication ([place])   . Dry eyes left  . Hiatal hernia   . Hyperlipidemia   . Hypertension   . Incomplete bladder emptying   . Nocturia   . 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 SOB (shortness of breath) on exertion     Current Outpatient Medications:  .  atorvastatin (LIPITOR) 20 MG tablet, Take 20 mg by mouth every evening. , Disp: , Rfl:  .  clopidogrel (PLAVIX) 75 MG tablet, Take 1 tablet (75 mg total) by mouth daily with breakfast., Disp: 30 tablet, Rfl: 6 .  cromolyn (OPTICROM) 4 % ophthalmic solution, 1 DROP IN BOTH EYES FOUR TIMES A DAY AS NEEDED, Disp: , Rfl: 12 .  diltiazem (TIAZAC) 240 MG 24 hr capsule, 1 (ONE) CAPSUL CAPSULE ER 24H CAPSULE ER 24HR DAILY, Disp: , Rfl: 6 .  isosorbide mononitrate (IMDUR) 60 MG 24 hr tablet, Take 60 mg by mouth daily., Disp: , Rfl:  .  metFORMIN (GLUCOPHAGE) 1000 MG tablet, TAKE 1 TABLET TWICE A DAY WITH A MEAL, Disp: 180 tablet, Rfl: 1 .  metoprolol succinate (TOPROL-XL) 100 MG 24 hr tablet, Take 100 mg by mouth every morning. , Disp: , Rfl:  .  nitroGLYCERIN (NITROSTAT) 0.4 MG SL tablet, Place 0.4 mg under the tongue every 5 (five) minutes as needed for chest pain., Disp: , Rfl:  .  omeprazole (PRILOSEC) 40 MG capsule, Take 40 mg by mouth daily., Disp: , Rfl:  .  valsartan-hydrochlorothiazide (DIOVAN-HCT) 320-25 MG per tablet, Take 1 tablet  by mouth daily., Disp: , Rfl:  .  verapamil (VERELAN PM) 240 MG 24 hr capsule, Take by mouth daily., Disp: , Rfl: 6  Social History   Tobacco Use  Smoking Status Former Smoker  . Packs/day: 0.50  . Years: 20.00  . Pack years: 10.00  . Types: Cigarettes  . Last attempt to quit: 03/28/1990  . Years since quitting: 27.8  Smokeless Tobacco Never Used    No Known Allergies Objective:  There were no vitals filed for this visit. There is no height or weight on file to calculate BMI. Constitutional Well developed. Well nourished.  Vascular Dorsalis pedis pulses palpable bilaterally. Posterior tibial pulses palpable bilaterally. Capillary refill normal to all digits.  No cyanosis or clubbing noted. Pedal hair growth normal.  Neurologic Normal speech. Oriented to person, place, and time. Epicritic sensation to light touch grossly present bilaterally.  Dermatologic Nails elongated dystrophic pain to palpation No open wounds. No skin lesions.  Orthopedic: Normal joint ROM without pain or crepitus bilaterally. No visible deformities. No bony tenderness.   Radiographs: None Assessment:   1. Pain due to onychomycosis of toenails of both feet   2. 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  Onychomycosis with coagulation defect. -Nails palliatively debridement as  below -Educated on self-care  Procedure: Nail Debridement Rationale: Pain Type of Debridement: manual, sharp debridement. Instrumentation: Nail nipper, rotary burr. Number of Nails: 10  Return in about 3 months (around 05/10/2018), or if symptoms worsen or fail to improve, for Routine foot care on [facility].

## 2018-02-08 DIAGNOSIS — R350 Frequency of micturition: Secondary | ICD-10-CM | POA: Diagnosis not present

## 2018-02-08 DIAGNOSIS — N3941 Urge incontinence: Secondary | ICD-10-CM | POA: Diagnosis not present

## 2018-02-09 ENCOUNTER — Ambulatory Visit: Payer: Federal, State, Local not specified - PPO | Admitting: Podiatry

## 2018-02-10 DIAGNOSIS — M62838 Other muscle spasm: Secondary | ICD-10-CM | POA: Diagnosis not present

## 2018-02-10 DIAGNOSIS — N393 Stress incontinence (female) (male): Secondary | ICD-10-CM | POA: Diagnosis not present

## 2018-02-10 DIAGNOSIS — R351 Nocturia: Secondary | ICD-10-CM | POA: Diagnosis not present

## 2018-02-10 DIAGNOSIS — M6281 Muscle weakness (generalized): Secondary | ICD-10-CM | POA: Diagnosis not present

## 2018-02-10 DIAGNOSIS — R350 Frequency of micturition: Secondary | ICD-10-CM | POA: Diagnosis not present

## 2018-02-10 DIAGNOSIS — N3941 Urge incontinence: Secondary | ICD-10-CM | POA: Diagnosis not present

## 2018-02-17 ENCOUNTER — Other Ambulatory Visit: Payer: Self-pay | Admitting: Internal Medicine

## 2018-02-23 DIAGNOSIS — N3281 Overactive bladder: Secondary | ICD-10-CM | POA: Diagnosis not present

## 2018-02-23 DIAGNOSIS — M6281 Muscle weakness (generalized): Secondary | ICD-10-CM | POA: Diagnosis not present

## 2018-02-23 DIAGNOSIS — R3915 Urgency of urination: Secondary | ICD-10-CM | POA: Diagnosis not present

## 2018-02-23 DIAGNOSIS — N3941 Urge incontinence: Secondary | ICD-10-CM | POA: Diagnosis not present

## 2018-02-23 DIAGNOSIS — R351 Nocturia: Secondary | ICD-10-CM | POA: Diagnosis not present

## 2018-02-23 DIAGNOSIS — M62838 Other muscle spasm: Secondary | ICD-10-CM | POA: Diagnosis not present

## 2018-03-01 DIAGNOSIS — R350 Frequency of micturition: Secondary | ICD-10-CM | POA: Diagnosis not present

## 2018-03-01 DIAGNOSIS — N3941 Urge incontinence: Secondary | ICD-10-CM | POA: Diagnosis not present

## 2018-03-07 DIAGNOSIS — N3946 Mixed incontinence: Secondary | ICD-10-CM | POA: Diagnosis not present

## 2018-03-07 DIAGNOSIS — R3915 Urgency of urination: Secondary | ICD-10-CM | POA: Diagnosis not present

## 2018-03-07 DIAGNOSIS — R351 Nocturia: Secondary | ICD-10-CM | POA: Diagnosis not present

## 2018-03-07 DIAGNOSIS — M62838 Other muscle spasm: Secondary | ICD-10-CM | POA: Diagnosis not present

## 2018-03-07 DIAGNOSIS — R3912 Poor urinary stream: Secondary | ICD-10-CM | POA: Diagnosis not present

## 2018-03-07 DIAGNOSIS — M6281 Muscle weakness (generalized): Secondary | ICD-10-CM | POA: Diagnosis not present

## 2018-03-21 DIAGNOSIS — M6281 Muscle weakness (generalized): Secondary | ICD-10-CM | POA: Diagnosis not present

## 2018-03-21 DIAGNOSIS — R351 Nocturia: Secondary | ICD-10-CM | POA: Diagnosis not present

## 2018-03-21 DIAGNOSIS — N3946 Mixed incontinence: Secondary | ICD-10-CM | POA: Diagnosis not present

## 2018-03-21 DIAGNOSIS — N393 Stress incontinence (female) (male): Secondary | ICD-10-CM | POA: Diagnosis not present

## 2018-03-21 DIAGNOSIS — M62838 Other muscle spasm: Secondary | ICD-10-CM | POA: Diagnosis not present

## 2018-03-29 DIAGNOSIS — I119 Hypertensive heart disease without heart failure: Secondary | ICD-10-CM | POA: Diagnosis not present

## 2018-03-29 DIAGNOSIS — I2510 Atherosclerotic heart disease of native coronary artery without angina pectoris: Secondary | ICD-10-CM | POA: Diagnosis not present

## 2018-03-29 DIAGNOSIS — E785 Hyperlipidemia, unspecified: Secondary | ICD-10-CM | POA: Diagnosis not present

## 2018-03-29 DIAGNOSIS — E669 Obesity, unspecified: Secondary | ICD-10-CM | POA: Diagnosis not present

## 2018-03-29 DIAGNOSIS — N3941 Urge incontinence: Secondary | ICD-10-CM | POA: Diagnosis not present

## 2018-03-30 DIAGNOSIS — E785 Hyperlipidemia, unspecified: Secondary | ICD-10-CM | POA: Diagnosis not present

## 2018-03-30 DIAGNOSIS — I119 Hypertensive heart disease without heart failure: Secondary | ICD-10-CM | POA: Diagnosis not present

## 2018-03-31 DIAGNOSIS — R351 Nocturia: Secondary | ICD-10-CM | POA: Diagnosis not present

## 2018-03-31 DIAGNOSIS — R32 Unspecified urinary incontinence: Secondary | ICD-10-CM | POA: Diagnosis not present

## 2018-03-31 DIAGNOSIS — N401 Enlarged prostate with lower urinary tract symptoms: Secondary | ICD-10-CM | POA: Diagnosis not present

## 2018-04-06 DIAGNOSIS — R3915 Urgency of urination: Secondary | ICD-10-CM | POA: Diagnosis not present

## 2018-04-06 DIAGNOSIS — M62838 Other muscle spasm: Secondary | ICD-10-CM | POA: Diagnosis not present

## 2018-04-06 DIAGNOSIS — N3946 Mixed incontinence: Secondary | ICD-10-CM | POA: Diagnosis not present

## 2018-04-06 DIAGNOSIS — R3912 Poor urinary stream: Secondary | ICD-10-CM | POA: Diagnosis not present

## 2018-04-06 DIAGNOSIS — M6281 Muscle weakness (generalized): Secondary | ICD-10-CM | POA: Diagnosis not present

## 2018-04-06 DIAGNOSIS — R350 Frequency of micturition: Secondary | ICD-10-CM | POA: Diagnosis not present

## 2018-04-21 DIAGNOSIS — R350 Frequency of micturition: Secondary | ICD-10-CM | POA: Diagnosis not present

## 2018-04-21 DIAGNOSIS — N3941 Urge incontinence: Secondary | ICD-10-CM | POA: Diagnosis not present

## 2018-05-05 DIAGNOSIS — N3946 Mixed incontinence: Secondary | ICD-10-CM | POA: Diagnosis not present

## 2018-05-05 DIAGNOSIS — M62838 Other muscle spasm: Secondary | ICD-10-CM | POA: Diagnosis not present

## 2018-05-05 DIAGNOSIS — M6281 Muscle weakness (generalized): Secondary | ICD-10-CM | POA: Diagnosis not present

## 2018-05-10 ENCOUNTER — Ambulatory Visit (INDEPENDENT_AMBULATORY_CARE_PROVIDER_SITE_OTHER): Payer: Medicare Other | Admitting: Podiatry

## 2018-05-10 ENCOUNTER — Encounter: Payer: Self-pay | Admitting: Podiatry

## 2018-05-10 DIAGNOSIS — B351 Tinea unguium: Secondary | ICD-10-CM | POA: Diagnosis not present

## 2018-05-10 DIAGNOSIS — E1142 Type 2 diabetes mellitus with diabetic polyneuropathy: Secondary | ICD-10-CM

## 2018-05-10 DIAGNOSIS — M79675 Pain in left toe(s): Secondary | ICD-10-CM | POA: Diagnosis not present

## 2018-05-10 DIAGNOSIS — M79674 Pain in right toe(s): Secondary | ICD-10-CM

## 2018-05-10 DIAGNOSIS — D689 Coagulation defect, unspecified: Secondary | ICD-10-CM

## 2018-05-10 NOTE — Progress Notes (Signed)
Complaint:  Visit Type: Patient returns to my office for continued preventative foot care services. Complaint: Patient states" my nails have grown long and thick and become painful to walk and wear shoes" Patient is taking plavix.. The patient presents for preventative foot care services. No changes to ROS  Podiatric Exam: Vascular: dorsalis pedis and posterior tibial pulses are palpable bilateral. Capillary return is immediate. Temperature gradient is WNL. Skin turgor WNL  Sensorium: Normal Semmes Weinstein monofilament test. Normal tactile sensation bilaterally  Nail Exam: Pt has thick disfigured discolored nails with subungual debris noted bilateral entire nail hallux through fifth toenails  Except hallux toe nail left foot.  Healing medial border right hallux. Ulcer Exam: There is no evidence of ulcer or pre-ulcerative changes or infection. Orthopedic Exam: Muscle tone and strength are WNL. No limitations in general ROM. No crepitus or effusions noted. Foot type and digits show no abnormalities. Bony prominences are unremarkable. Skin: No Porokeratosis. No infection or ulcers  Diagnosis:  Onychomycosis, , Pain in right toe, pain in left toes  Treatment & Plan Procedures and Treatment: Consent by patient was obtained for treatment procedures.   Debridement of mycotic and hypertrophic toenails, 1 through 5 bilateral and clearing of subungual debris. No ulceration, no infection noted.  Return Visit-Office Procedure: Patient instructed to return to the office for a follow up visit 3 months for continued evaluation and treatment.    Gardiner Barefoot DPM

## 2018-05-12 DIAGNOSIS — R3915 Urgency of urination: Secondary | ICD-10-CM | POA: Diagnosis not present

## 2018-05-12 DIAGNOSIS — R32 Unspecified urinary incontinence: Secondary | ICD-10-CM | POA: Diagnosis not present

## 2018-05-12 DIAGNOSIS — N401 Enlarged prostate with lower urinary tract symptoms: Secondary | ICD-10-CM | POA: Diagnosis not present

## 2018-05-12 DIAGNOSIS — R350 Frequency of micturition: Secondary | ICD-10-CM | POA: Diagnosis not present

## 2018-05-13 ENCOUNTER — Other Ambulatory Visit: Payer: Self-pay | Admitting: Urology

## 2018-05-23 DIAGNOSIS — M6281 Muscle weakness (generalized): Secondary | ICD-10-CM | POA: Diagnosis not present

## 2018-05-23 DIAGNOSIS — R351 Nocturia: Secondary | ICD-10-CM | POA: Diagnosis not present

## 2018-05-23 DIAGNOSIS — N393 Stress incontinence (female) (male): Secondary | ICD-10-CM | POA: Diagnosis not present

## 2018-05-23 DIAGNOSIS — M62838 Other muscle spasm: Secondary | ICD-10-CM | POA: Diagnosis not present

## 2018-05-23 DIAGNOSIS — N3946 Mixed incontinence: Secondary | ICD-10-CM | POA: Diagnosis not present

## 2018-05-24 ENCOUNTER — Ambulatory Visit (INDEPENDENT_AMBULATORY_CARE_PROVIDER_SITE_OTHER): Payer: Medicare Other | Admitting: Internal Medicine

## 2018-05-24 ENCOUNTER — Encounter: Payer: Self-pay | Admitting: Internal Medicine

## 2018-05-24 VITALS — BP 126/82 | HR 93 | Temp 97.9°F | Ht 69.0 in | Wt 219.0 lb

## 2018-05-24 DIAGNOSIS — L02411 Cutaneous abscess of right axilla: Secondary | ICD-10-CM

## 2018-05-24 DIAGNOSIS — E1142 Type 2 diabetes mellitus with diabetic polyneuropathy: Secondary | ICD-10-CM

## 2018-05-24 DIAGNOSIS — I10 Essential (primary) hypertension: Secondary | ICD-10-CM | POA: Diagnosis not present

## 2018-05-24 MED ORDER — DOXYCYCLINE HYCLATE 100 MG PO TABS: 100.0000 mg | ORAL_TABLET | Freq: Two times a day (BID) | ORAL | 0 refills | Status: DC

## 2018-05-24 MED ORDER — HYDROCODONE-ACETAMINOPHEN 5-325 MG PO TABS: 1.0000 | ORAL_TABLET | Freq: Four times a day (QID) | ORAL | 0 refills | Status: DC | PRN

## 2018-05-24 NOTE — Assessment & Plan Note (Signed)
Mod to severe, for antibx course, pain control, and refer General Surgury for definitive management, to f/u any worsening symptoms or concerns

## 2018-05-24 NOTE — Assessment & Plan Note (Signed)
stable overall by history and exam, recent data reviewed with pt, and pt to continue medical treatment as before,  to f/u any worsening symptoms or concerns  

## 2018-05-24 NOTE — Assessment & Plan Note (Signed)
stable overall by history and exam, recent data reviewed with pt, and pt to continue medical treatment as before,  to f/u any worsening symptoms or concerns Lab Results  Component Value Date   HGBA1C 6.8 (H) 10/19/2017

## 2018-05-24 NOTE — Progress Notes (Signed)
Subjective:    Patient ID: Christian [name], male    DOB: Jul 22, 1945, 73 y.o.   MRN: [id number]  HPI  Here to f/u with acute visit for right axillary pain, red, swelling x 3 days now mod to severe, constant, sharp, worse to raise the arm, nothing makes better, and associated with some drainage today on the shirt.  No prior hx of known MRSA or other abscess recently.  No fever, chills, no evidence for sepsis.  Pt denies chest pain, increased sob or doe, wheezing, orthopnea, PND, increased LE swelling, palpitations, dizziness or syncope.   Pt denies polydipsia, polyuria Past Medical History:  Diagnosis Date  . BPH (benign prostatic hypertrophy)   . Coronary artery disease   . Diabetes mellitus without complication ([place])   . Dry eyes left  . Hiatal hernia   . Hyperlipidemia   . Hypertension   . Incomplete bladder emptying   . Nocturia   . 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 SOB (shortness of breath) on exertion    Past Surgical History:  Procedure Laterality Date  . CARDIAC CATHETERIZATION    . CORONARY STENT PLACEMENT  02/27/2014   distal rt/pd coronary       dr Einar Gip  . CYSTO/ BLADDER BIOPSY'S/ CAUTHERIZATION  01-14-2004  DR Gaynelle Arabian  . LEFT HEART CATHETERIZATION WITH CORONARY ANGIOGRAM N/A 02/27/2014   Procedure: LEFT HEART CATHETERIZATION WITH CORONARY ANGIOGRAM;  Surgeon: Laverda Page, MD;  Location: [facility] CATH LAB;  Service: Cardiovascular;  Laterality: N/A;  . PERCUTANEOUS CORONARY STENT INTERVENTION (PCI-S)  02/27/2014   Procedure: PERCUTANEOUS CORONARY STENT INTERVENTION (PCI-S);  Surgeon: Laverda Page, MD;  Location: [facility] CATH LAB;  Service: Cardiovascular;;  rt PDA  3.0/28mm Promus stent  . TRANSURETHRAL RESECTION OF PROSTATE  04/04/2012   Procedure: TRANSURETHRAL RESECTION OF THE PROSTATE WITH GYRUS INSTRUMENTS;  Surgeon: Ailene Rud, MD;  Location:  [facility];  Service: Urology;  Laterality: N/A;  . TRANSURETHRAL RESECTION OF PROSTATE N/A 09/27/2014   Procedure: TRANSURETHRAL RESECTION OF THE PROSTATE ;  Surgeon: Carolan Clines, MD;  Location: [facility] ORS;  Service: Urology;  Laterality: N/A;  . UPPER GASTROINTESTINAL ENDOSCOPY      reports that he quit smoking about 28 years ago. His smoking use included cigarettes. He has a 10.00 pack-year smoking history. He has never used smokeless tobacco. He reports current alcohol use. He reports that he does not use drugs. family history includes Diabetes in his brother, father, and mother. No Known Allergies Current Outpatient Medications on File Prior to Visit  Medication Sig Dispense Refill  . atorvastatin (LIPITOR) 20 MG tablet Take 20 mg by mouth every evening.     . clopidogrel (PLAVIX) 75 MG tablet Take 1 tablet (75 mg total) by mouth daily with breakfast. 30 tablet 6  . cromolyn (OPTICROM) 4 % ophthalmic solution 1 DROP IN BOTH EYES FOUR TIMES A DAY AS NEEDED  12  . diltiazem (TIAZAC) 240 MG 24 hr capsule 1 (ONE) CAPSUL CAPSULE ER 24H CAPSULE ER 24HR DAILY  6  . isosorbide mononitrate (IMDUR) 60 MG 24 hr tablet Take 60 mg by mouth daily.    . metFORMIN (GLUCOPHAGE) 1000 MG tablet TAKE 1 TABLET TWICE A DAY WITH A MEAL 180 tablet 1  . metoprolol succinate (TOPROL-XL) 100 MG 24 hr tablet Take 100 mg by mouth every morning.     Marland Kitchen  nitroGLYCERIN (NITROSTAT) 0.4 MG SL tablet Place 0.4 mg under the tongue every 5 (five) minutes as needed for chest pain.    Marland Kitchen omeprazole (PRILOSEC) 40 MG capsule Take 40 mg by mouth daily.    . valsartan-hydrochlorothiazide (DIOVAN-HCT) 320-25 MG per tablet Take 1 tablet by mouth daily.    . verapamil (VERELAN PM) 240 MG 24 hr capsule Take by mouth daily.  6   No current facility-administered medications on file prior to visit.    Review of Systems  Constitutional: Negative for other unusual diaphoresis or sweats HENT: Negative for ear discharge or  swelling Eyes: Negative for other worsening visual disturbances Respiratory: Negative for stridor or other swelling  Gastrointestinal: Negative for worsening distension or other blood Genitourinary: Negative for retention or other urinary change Musculoskeletal: Negative for other MSK pain or swelling Skin: Negative for color change or other new lesions Neurological: Negative for worsening tremors and other numbness  Psychiatric/Behavioral: Negative for worsening agitation or other fatigue All other system neg per pt    Objective:   Physical Exam BP 126/82   Pulse 93   Temp 97.9 F (36.6 C) (Oral)   Ht 5\' 9"  (1.753 m)   Wt 219 lb (99.3 kg)   SpO2 96%   BMI 32.34 kg/m  VS noted,  Constitutional: Pt appears in NAD HENT: Head: NCAT.  Right Ear: External ear normal.  Left Ear: External ear normal.  Eyes: . Pupils are equal, round, and reactive to light. Conjunctivae and EOM are normal Nose: without d/c or deformity Neck: Neck supple. Gross normal ROM Cardiovascular: Normal rate and regular rhythm.   Pulmonary/Chest: Effort normal and breath sounds without rales or wheezing.  Right axilla with large 3 cm area red, severe tender raised possibly multiple area of abscess with one with slight drainage Neurological: Pt is alert. At baseline orientation, motor grossly intact Skin: Skin is warm. No rashes, other new lesions, no LE edema Psychiatric: Pt behavior is normal without agitation  No other exam findings       Assessment & Plan:

## 2018-05-24 NOTE — Patient Instructions (Signed)
Please take all new medication as prescribed - the antibiotic, pain medication  You will be contacted regarding the referral for: General Surgury  Please continue all other medications as before, and refills have been done if requested.  Please have the pharmacy call with any other refills you may need.  Please keep your appointments with your specialists as you may have planned

## 2018-06-01 DIAGNOSIS — M62838 Other muscle spasm: Secondary | ICD-10-CM | POA: Diagnosis not present

## 2018-06-01 DIAGNOSIS — M6281 Muscle weakness (generalized): Secondary | ICD-10-CM | POA: Diagnosis not present

## 2018-06-01 DIAGNOSIS — N3946 Mixed incontinence: Secondary | ICD-10-CM | POA: Diagnosis not present

## 2018-06-01 DIAGNOSIS — R351 Nocturia: Secondary | ICD-10-CM | POA: Diagnosis not present

## 2018-06-02 DIAGNOSIS — R3915 Urgency of urination: Secondary | ICD-10-CM | POA: Diagnosis not present

## 2018-06-02 DIAGNOSIS — N3941 Urge incontinence: Secondary | ICD-10-CM | POA: Diagnosis not present

## 2018-06-02 DIAGNOSIS — R350 Frequency of micturition: Secondary | ICD-10-CM | POA: Diagnosis not present

## 2018-06-06 ENCOUNTER — Other Ambulatory Visit: Payer: Self-pay | Admitting: Surgery

## 2018-06-06 DIAGNOSIS — L02411 Cutaneous abscess of right axilla: Secondary | ICD-10-CM | POA: Diagnosis not present

## 2018-06-20 ENCOUNTER — Other Ambulatory Visit: Payer: Self-pay | Admitting: Cardiology

## 2018-06-22 DIAGNOSIS — N3946 Mixed incontinence: Secondary | ICD-10-CM | POA: Diagnosis not present

## 2018-06-22 DIAGNOSIS — M6281 Muscle weakness (generalized): Secondary | ICD-10-CM | POA: Diagnosis not present

## 2018-06-22 DIAGNOSIS — M62838 Other muscle spasm: Secondary | ICD-10-CM | POA: Diagnosis not present

## 2018-06-23 DIAGNOSIS — R350 Frequency of micturition: Secondary | ICD-10-CM | POA: Diagnosis not present

## 2018-06-23 DIAGNOSIS — N3941 Urge incontinence: Secondary | ICD-10-CM | POA: Diagnosis not present

## 2018-06-24 ENCOUNTER — Other Ambulatory Visit: Payer: Self-pay | Admitting: Surgery

## 2018-06-24 DIAGNOSIS — L02411 Cutaneous abscess of right axilla: Secondary | ICD-10-CM | POA: Diagnosis not present

## 2018-07-24 ENCOUNTER — Other Ambulatory Visit: Payer: Self-pay | Admitting: Cardiology

## 2018-07-24 DIAGNOSIS — I119 Hypertensive heart disease without heart failure: Secondary | ICD-10-CM

## 2018-07-30 DIAGNOSIS — I2510 Atherosclerotic heart disease of native coronary artery without angina pectoris: Secondary | ICD-10-CM | POA: Insufficient documentation

## 2018-07-30 HISTORY — DX: Atherosclerotic heart disease of native coronary artery without angina pectoris: I25.10

## 2018-07-30 NOTE — Progress Notes (Signed)
Subjective:  Primary Physician:  Hoyt Koch, MD  Patient ID: Christian [name], male    DOB: 01-27-1946, 73 y.o.   MRN: [id number]  Chief Complaint  Patient presents with  . Coronary Artery Disease  . Follow-up  This visit type was conducted due to national recommendations for restrictions regarding the COVID-19 Pandemic (e.g. social distancing).  This format is felt to be most appropriate for this patient at this time.  All issues noted in this document were discussed and addressed.  No physical exam was performed (except for noted visual exam findings with Telehealth visits - very limited).  The patient has consented to conduct a Telehealth visit and understands insurance will be billed.   I connected with patient, on 08/01/18  by a video enabled telemedicine application and verified that I am speaking with the correct person using two identifiers.     I discussed the limitations of evaluation and management by telemedicine and the availability of in person appointments. The patient expressed understanding and agreed to proceed.   I have discussed with patient regarding the safety during COVID Pandemic and steps and precautions including social distancing with the patient.   HPI: Christian [name]  is a 73 y.o. male  who presents for a Follow-up for CAD. Coronary angiogram 02/27/2014 - Stenting PDA with 3.0 x 28 mm Promus Premier DES. Left coronaries mild disease. LVEF 60%.  Pt. denies chest pain. He has shortness of breath on walking for about half block, unchanged from before. No history of orthopnea or PND. No history of hematuria, GI bleed or any other bleeding. He denies any dark stools.  No palpitations, heart racing or irregular heartbeat. He has occasional lightheadedness but there is no associated palpitation. No near syncope or syncope. He has mild chronic swelling on the legs, no h/o claudication. No history of diffuse muscle aching, soreness or muscle weakness. He  complains of feeling tired on doing housework, it has been unchanged from before.  Patient has hypertension, hypercholesterolemia, diabetes and obesity. He does not smoke.  Past Medical History:  Diagnosis Date  . BPH (benign prostatic hypertrophy)   . Coronary artery disease   . Diabetes mellitus without complication ([place])   . Dry eyes left  . Hiatal hernia   . Hyperlipidemia   . Hypertension   . Incomplete bladder emptying   . Nocturia   . 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 SOB (shortness of breath) on exertion     Past Surgical History:  Procedure Laterality Date  . CARDIAC CATHETERIZATION    . CORONARY STENT PLACEMENT  02/27/2014   distal rt/pd coronary       dr Einar Gip  . CYSTO/ BLADDER BIOPSY'S/ CAUTHERIZATION  01-14-2004  DR Gaynelle Arabian  . LEFT HEART CATHETERIZATION WITH CORONARY ANGIOGRAM N/A 02/27/2014   Procedure: LEFT HEART CATHETERIZATION WITH CORONARY ANGIOGRAM;  Surgeon: Laverda Page, MD;  Location: [facility] CATH LAB;  Service: Cardiovascular;  Laterality: N/A;  . PERCUTANEOUS CORONARY STENT INTERVENTION (PCI-S)  02/27/2014   Procedure: PERCUTANEOUS CORONARY STENT INTERVENTION (PCI-S);  Surgeon: Laverda Page, MD;  [facility] CATH LAB;  Service: Cardiovascular;;  rt PDA  3.0/28mm Promus stent  . TRANSURETHRAL RESECTION OF PROSTATE  04/04/2012   Procedure: TRANSURETHRAL RESECTION OF THE PROSTATE WITH GYRUS INSTRUMENTS;  Surgeon: Ailene Rud, MD;  Location: [facility];  Service: Urology;  Laterality: N/A;  . TRANSURETHRAL RESECTION OF PROSTATE N/A 09/27/2014   Procedure: TRANSURETHRAL RESECTION OF THE PROSTATE ;  Surgeon: Carolan Clines, MD;  Location: [facility] ORS;  Service: Urology;  Laterality: N/A;  . UPPER GASTROINTESTINAL ENDOSCOPY      Social History   Socioeconomic History  . Marital status: Married    Spouse name: Not on file   . Number of children: 2  . Years of education: Not on file  . Highest education level: Not on file  Occupational History  . Not on file  Social Needs  . Financial resource strain: Not hard at all  . Food insecurity:    Worry: Never true    Inability: Never true  . Transportation needs:    Medical: No    Non-medical: No  Tobacco Use  . Smoking status: Former Smoker    Packs/day: 0.50    Years: 20.00    Pack years: 10.00    Types: Cigarettes    Last attempt to quit: 03/28/1990    Years since quitting: 28.3  . Smokeless tobacco: Never Used  Substance and Sexual Activity  . Alcohol use: Yes    Alcohol/week: 0.0 standard drinks    Comment: 1 beer week rarely  . Drug use: No  . Sexual activity: Not Currently  Lifestyle  . Physical activity:    Days per week: 0 days    Minutes per session: 0 min  . Stress: Only a little  Relationships  . Social connections:    Talks on phone: More than three times a week    Gets together: More than three times a week    Attends religious service: More than 4 times per year    Active member of club or organization: Yes    Attends meetings of clubs or organizations: More than 4 times per year    Relationship status: Married  . Intimate partner violence:    Fear of current or ex partner: No    Emotionally abused: No    Physically abused: No    Forced sexual activity: No  Other Topics Concern  . Not on file  Social History Narrative  . Not on file    Current Outpatient Medications on File Prior to Visit  Medication Sig Dispense Refill  . atorvastatin (LIPITOR) 20 MG tablet Take 20 mg by mouth every evening.     . clopidogrel (PLAVIX) 75 MG tablet Take 1 tablet (75 mg total) by mouth daily with breakfast. 30 tablet 6  . cromolyn (OPTICROM) 4 % ophthalmic solution 1 DROP IN BOTH EYES FOUR TIMES A DAY AS NEEDED  12  . isosorbide mononitrate (IMDUR) 60 MG 24 hr tablet Take 60 mg by mouth daily.    . metFORMIN (GLUCOPHAGE) 1000 MG tablet  TAKE 1 TABLET TWICE A DAY WITH A MEAL 180 tablet 1  . metoprolol succinate (TOPROL-XL) 100 MG 24 hr tablet TAKE 1 TABLET EVERY DAY 90 tablet 0  . nitroGLYCERIN (NITROSTAT) 0.4 MG SL tablet Place 0.4 mg under the tongue every 5 (five) minutes as needed for chest pain.    . verapamil (VERELAN PM) 240 MG 24 hr capsule TAKE 1 CAPSULE BY MOUTH EVERY DAY 90 capsule 0   No current facility-administered medications on file prior to visit.     Review of Systems  Constitutional: Negative for fever.  HENT: Negative for nosebleeds.   Eyes: Negative for blurred vision.  Respiratory: Positive for shortness of breath. Negative for cough.   Cardiovascular: Positive  for leg swelling (mild).  Gastrointestinal: Negative for abdominal pain, nausea and vomiting.  Genitourinary: Negative for dysuria.  Musculoskeletal: Negative for myalgias.  Skin: Negative for itching and rash.  Neurological: Negative for dizziness and loss of consciousness.  Psychiatric/Behavioral: The patient is not nervous/anxious.        Objective:  Blood pressure (!) 170/92, pulse 94, height 5\' 9"  (1.753 m), weight 210 lb (95.3 kg). Body mass index is 31.01 kg/m.  Physical Exam Patient is alert and oriented, appeared very comfortable and in good spirits.  He is mildly obese.  No other detailed examination was possible as it was a telemedicine visit. CARDIAC STUDIES:  Coronary angiogram 02/27/2014: Stenting PDA with 3.0 x 28 mm Promus Premier DES. Left coronaries mild disease. LVEF 60%.  Echocardiogram 09/17/2017: Left ventricle cavity is normal in size. Mild concentric hypertrophy of the left ventricle. Normal global wall motion. Doppler evidence of grade I (impaired) diastolic dysfunction. Visual EF is 50-55%. Calculated EF 54%. No aortic valve regurgitation noted. Mild aortic valve leaflet thickening. Mild tricuspid regurgitation. No evidence of pulmonary hypertension. Compared to 06/26/2014, no significant change.  Lexiscan  myoview stress test 04/13/2016: 1. The resting electrocardiogram demonstrated normal sinus rhythm, normal resting conduction, no resting arrhythmias and normal rest repolarization. Stress EKG is non-diagnostic for ischemia as it a pharmacologic stress using Lexiscan. Stress symptoms included dyspnea and chest discomfort. Hypertensive at rest and stress with peak BP 192/110 mm Hg. 2. Perfusion imaging study demonstrates soft tissue attenuation artifact in the inferior wall. There is no demonstrable ischemia or scar. LV systolic function was normal at 50% but visually appears to be more than 50%. This is a low risk study. Compared to the study 10/24/2012, small sized inferior ischemia is no longer present. This represents a low risk study.   Assessment & Recommendations:  Atherosclerosis of native coronary artery of native heart without angina pectoris  S/P angioplasty with stent -  Stenting of PDA with 3.0 x 28 mm Promus Premier DES.  Hypertension with heart disease  Hypercholesterolemia  Obesity (BMI 30-39.9)   Laboratory Exam:  CBC Latest Ref Rng & Units 10/19/2017 03/29/2017 08/27/2016  WBC 4.0 - 10.5 K/uL 7.7 8.8 8.1  Hemoglobin 13.0 - 17.0 g/dL 14.1 13.8 13.6  Hematocrit 39.0 - 52.0 % 42.1 42.1 41.3  Platelets 150.0 - 400.0 K/uL 213.0 225.0 206.0   CMP Latest Ref Rng & Units 10/19/2017 03/29/2017 08/27/2016  Glucose 70 - 99 mg/dL 172(H) 83 119(H)  BUN 6 - 23 mg/dL 17 13 18   Creatinine 0.40 - 1.50 mg/dL 1.19 1.19 1.20  Sodium 135 - 145 mEq/L 138 141 142  Potassium 3.5 - 5.1 mEq/L 3.7 4.2 4.2  Chloride 96 - 112 mEq/L 101 103 106  CO2 19 - 32 mEq/L 26 30 30   Calcium 8.4 - 10.5 mg/dL 9.6 10.3 10.0  Total Protein 6.0 - 8.3 g/dL 6.7 7.1 6.8  Total Bilirubin 0.2 - 1.2 mg/dL 0.3 0.3 0.3  Alkaline Phos 39 - 117 U/L 78 74 73  AST 0 - 37 U/L 22 22 18   ALT 0 - 53 U/L 34 32 24   Lipid Panel  03/30/2018-cholesterol-116, HDL-51, LDL-41, triglycerides-120. Borderline elevated SGPT, otherwise  normal CMP.  Normal CBC and normal TSH.     Component Value Date/Time   CHOL 111 10/19/2017 1110   TRIG 221.0 (H) 10/19/2017 1110   HDL 47.80 10/19/2017 1110   CHOLHDL 2 10/19/2017 1110   VLDL 44.2 (H) 10/19/2017 1110   LDLCALC 37  08/27/2016 1343   LDLDIRECT 41.0 10/19/2017 1110     Recommendation:  CAD is stable, no angina. His blood pressure is elevated today. Patient said that he has been checking blood pressure: at home fairly regularly. It has been satisfactory.  Yesterday, his blood pressure was 120/67, day before yesterday it was 150/74 mmHg.  I have advised him to continue present medications.He was also advised to continue monitoring blood pressure at home and call us if it stays high.  Secondary prevention was again discussed. He was advised to continue strict diet to lose weight. Continue ADA, low salt, low cholesterol diet.  Return for follow-up after 4 months but call us earlier if there are any cardiac problems.    Despina Hick, MD, [facility] 08/01/2018, 4:23 PM [facility] [place] Pager: [phone] Office: [phone] If no answer Cell [phone]

## 2018-08-01 ENCOUNTER — Encounter: Payer: Self-pay | Admitting: Cardiology

## 2018-08-01 ENCOUNTER — Ambulatory Visit (INDEPENDENT_AMBULATORY_CARE_PROVIDER_SITE_OTHER): Payer: Medicare Other | Admitting: Cardiology

## 2018-08-01 ENCOUNTER — Other Ambulatory Visit: Payer: Self-pay

## 2018-08-01 VITALS — BP 170/92 | HR 94 | Ht 69.0 in | Wt 210.0 lb

## 2018-08-01 DIAGNOSIS — Z95820 Peripheral vascular angioplasty status with implants and grafts: Secondary | ICD-10-CM | POA: Diagnosis not present

## 2018-08-01 DIAGNOSIS — I2510 Atherosclerotic heart disease of native coronary artery without angina pectoris: Secondary | ICD-10-CM

## 2018-08-01 DIAGNOSIS — E669 Obesity, unspecified: Secondary | ICD-10-CM | POA: Diagnosis not present

## 2018-08-01 DIAGNOSIS — I119 Hypertensive heart disease without heart failure: Secondary | ICD-10-CM | POA: Diagnosis not present

## 2018-08-01 DIAGNOSIS — E7800 Pure hypercholesterolemia, unspecified: Secondary | ICD-10-CM | POA: Diagnosis not present

## 2018-08-02 ENCOUNTER — Ambulatory Visit: Payer: Self-pay | Admitting: Cardiology

## 2018-08-03 ENCOUNTER — Other Ambulatory Visit: Payer: Self-pay | Admitting: Cardiology

## 2018-08-03 DIAGNOSIS — I25118 Atherosclerotic heart disease of native coronary artery with other forms of angina pectoris: Secondary | ICD-10-CM

## 2018-08-09 ENCOUNTER — Ambulatory Visit: Payer: Medicare Other | Admitting: Podiatry

## 2018-08-12 ENCOUNTER — Other Ambulatory Visit: Payer: Self-pay | Admitting: Internal Medicine

## 2018-09-15 ENCOUNTER — Other Ambulatory Visit: Payer: Self-pay | Admitting: Cardiology

## 2018-09-20 DIAGNOSIS — M6281 Muscle weakness (generalized): Secondary | ICD-10-CM | POA: Diagnosis not present

## 2018-09-20 DIAGNOSIS — M62838 Other muscle spasm: Secondary | ICD-10-CM | POA: Diagnosis not present

## 2018-09-20 DIAGNOSIS — R351 Nocturia: Secondary | ICD-10-CM | POA: Diagnosis not present

## 2018-09-20 DIAGNOSIS — N3946 Mixed incontinence: Secondary | ICD-10-CM | POA: Diagnosis not present

## 2018-09-23 DIAGNOSIS — R350 Frequency of micturition: Secondary | ICD-10-CM | POA: Diagnosis not present

## 2018-10-05 ENCOUNTER — Other Ambulatory Visit: Payer: Self-pay | Admitting: Cardiology

## 2018-10-05 NOTE — Telephone Encounter (Signed)
Please fill

## 2018-10-11 DIAGNOSIS — M6281 Muscle weakness (generalized): Secondary | ICD-10-CM | POA: Diagnosis not present

## 2018-10-11 DIAGNOSIS — N393 Stress incontinence (female) (male): Secondary | ICD-10-CM | POA: Diagnosis not present

## 2018-10-11 DIAGNOSIS — M62838 Other muscle spasm: Secondary | ICD-10-CM | POA: Diagnosis not present

## 2018-10-13 DIAGNOSIS — R350 Frequency of micturition: Secondary | ICD-10-CM | POA: Diagnosis not present

## 2018-10-13 DIAGNOSIS — N3941 Urge incontinence: Secondary | ICD-10-CM | POA: Diagnosis not present

## 2018-10-18 DIAGNOSIS — H40013 Open angle with borderline findings, low risk, bilateral: Secondary | ICD-10-CM | POA: Diagnosis not present

## 2018-10-18 DIAGNOSIS — H04123 Dry eye syndrome of bilateral lacrimal glands: Secondary | ICD-10-CM | POA: Diagnosis not present

## 2018-10-18 DIAGNOSIS — E119 Type 2 diabetes mellitus without complications: Secondary | ICD-10-CM | POA: Diagnosis not present

## 2018-10-18 DIAGNOSIS — H25813 Combined forms of age-related cataract, bilateral: Secondary | ICD-10-CM | POA: Diagnosis not present

## 2018-10-18 DIAGNOSIS — H10413 Chronic giant papillary conjunctivitis, bilateral: Secondary | ICD-10-CM | POA: Diagnosis not present

## 2018-10-18 LAB — HM DIABETES EYE EXAM

## 2018-10-19 ENCOUNTER — Other Ambulatory Visit: Payer: Self-pay | Admitting: Cardiology

## 2018-10-19 DIAGNOSIS — I119 Hypertensive heart disease without heart failure: Secondary | ICD-10-CM

## 2018-10-19 NOTE — Telephone Encounter (Signed)
Please fill

## 2018-10-23 ENCOUNTER — Other Ambulatory Visit: Payer: Self-pay | Admitting: Cardiology

## 2018-10-26 ENCOUNTER — Other Ambulatory Visit: Payer: Self-pay | Admitting: Cardiology

## 2018-10-26 DIAGNOSIS — I25118 Atherosclerotic heart disease of native coronary artery with other forms of angina pectoris: Secondary | ICD-10-CM

## 2018-10-31 DIAGNOSIS — M6281 Muscle weakness (generalized): Secondary | ICD-10-CM | POA: Diagnosis not present

## 2018-10-31 DIAGNOSIS — R351 Nocturia: Secondary | ICD-10-CM | POA: Diagnosis not present

## 2018-10-31 DIAGNOSIS — N3946 Mixed incontinence: Secondary | ICD-10-CM | POA: Diagnosis not present

## 2018-10-31 DIAGNOSIS — M62838 Other muscle spasm: Secondary | ICD-10-CM | POA: Diagnosis not present

## 2018-10-31 DIAGNOSIS — N393 Stress incontinence (female) (male): Secondary | ICD-10-CM | POA: Diagnosis not present

## 2018-11-01 ENCOUNTER — Ambulatory Visit (INDEPENDENT_AMBULATORY_CARE_PROVIDER_SITE_OTHER): Payer: Medicare Other | Admitting: Podiatry

## 2018-11-01 ENCOUNTER — Encounter: Payer: Self-pay | Admitting: Podiatry

## 2018-11-01 ENCOUNTER — Other Ambulatory Visit: Payer: Self-pay

## 2018-11-01 VITALS — Temp 97.6°F

## 2018-11-01 DIAGNOSIS — B351 Tinea unguium: Secondary | ICD-10-CM | POA: Diagnosis not present

## 2018-11-01 DIAGNOSIS — M79675 Pain in left toe(s): Secondary | ICD-10-CM | POA: Diagnosis not present

## 2018-11-01 DIAGNOSIS — M79674 Pain in right toe(s): Secondary | ICD-10-CM | POA: Diagnosis not present

## 2018-11-01 NOTE — Patient Instructions (Signed)

## 2018-11-03 DIAGNOSIS — R350 Frequency of micturition: Secondary | ICD-10-CM | POA: Diagnosis not present

## 2018-11-03 NOTE — Progress Notes (Addendum)
Subjective:  Christian [name] presents to clinic today with cc of  painful, thick, discolored, elongated toenails 1-5 b/l that become tender and cannot cut because of thickness.  Pain is aggravated when wearing enclosed shoe gear.  Hoyt Koch, MD is his PCP.    Current Outpatient Medications:  .  atorvastatin (LIPITOR) 20 MG tablet, Take 20 mg by mouth every evening. , Disp: , Rfl:  .  cloNIDine (CATAPRES) 0.1 MG tablet, TAKE 1 TABLET BY MOUTH EVERY DAY, Disp: 90 tablet, Rfl: 2 .  clopidogrel (PLAVIX) 75 MG tablet, Take 1 tablet (75 mg total) by mouth daily with breakfast., Disp: 30 tablet, Rfl: 6 .  cromolyn (OPTICROM) 4 % ophthalmic solution, 1 DROP IN BOTH EYES FOUR TIMES A DAY AS NEEDED, Disp: , Rfl: 12 .  isosorbide mononitrate (IMDUR) 60 MG 24 hr tablet, TAKE 1 TABLET BY MOUTH EVERY DAY, Disp: 90 tablet, Rfl: 0 .  metFORMIN (GLUCOPHAGE) 1000 MG tablet, TAKE 1 TABLET TWICE A DAY WITH A MEAL, Disp: 180 tablet, Rfl: 1 .  metoprolol succinate (TOPROL-XL) 100 MG 24 hr tablet, TAKE 1 TABLET BY MOUTH EVERY DAY, Disp: 90 tablet, Rfl: 0 .  nitroGLYCERIN (NITROSTAT) 0.4 MG SL tablet, Place 0.4 mg under the tongue every 5 (five) minutes as needed for chest pain., Disp: , Rfl:  .  olmesartan-hydrochlorothiazide (BENICAR HCT) 40-25 MG tablet, TAKE 1 TABLET BY MOUTH EVERY DAY, Disp: 90 tablet, Rfl: 2 .  PREVIDENT 5000 BOOSTER PLUS 1.1 % PSTE, APPLY A THIN RIBBON TO TOOTHBRUSH AND BRUSH TEETH FOR 2 MINUTES, USE DAILY, Disp: , Rfl:  .  tamsulosin (FLOMAX) 0.4 MG CAPS capsule, , Disp: , Rfl:  .  verapamil (VERELAN PM) 240 MG 24 hr capsule, TAKE 1 CAPSULE BY MOUTH EVERY DAY, Disp: 90 capsule, Rfl: 2   No Known Allergies   Objective: Vitals:   11/01/18 1048  Temp: 97.6 F (36.4 C)    Physical Examination:  Vascular Examination: Capillary refill time immediate x 10 digits.  Palpable DP/PT pulses b/l.  Digital hair present b/l.  No edema noted b/l.  Skin temperature gradient WNL  b/l.  Dermatological Examination: Skin with normal turgor, texture and tone b/l.  No open wounds b/l.  No interdigital macerations noted b/l.  Elongated, thick, discolored brittle toenails with subungual debris and pain on dorsal palpation of nailbeds 1-5 right, 2-5 left foot.  Anonychia left great toe with evidence of permanent total nail avulsion. Nailbed(s) completely epithelialized and intact.  There is noted onchyolysis of entire nailplate of the distal 1/3 of the left 2nd toe.  The nailbed remains intact. Thee is no erythema, no edema, no drainage, no underlying flocculence.  Musculoskeletal Examination: Muscle strength 5/5 to all muscle groups b/l.  No pain, crepitus or joint discomfort with active/passive ROM.  Neurological Examination: Sensation intact 5/5 b/l with 10 gram monofilament.  Vibratory sensation intact b/l.  Proprioceptive sensation intact b/l.  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 Toenails  1-5 right, 2-5 left foot were debrided in length and girth without iatrogenic laceration. 2.  Continue soft, supportive shoe gear daily. 3.  Report any pedal injuries to medical professional. 4.  Follow up 3 months. 5.  Patient/POA to call should there be a question/concern in there interim.

## 2018-11-22 ENCOUNTER — Ambulatory Visit: Payer: Self-pay | Admitting: Internal Medicine

## 2018-11-22 DIAGNOSIS — M6281 Muscle weakness (generalized): Secondary | ICD-10-CM | POA: Diagnosis not present

## 2018-11-22 DIAGNOSIS — N393 Stress incontinence (female) (male): Secondary | ICD-10-CM | POA: Diagnosis not present

## 2018-11-22 DIAGNOSIS — M62838 Other muscle spasm: Secondary | ICD-10-CM | POA: Diagnosis not present

## 2018-11-22 NOTE — Telephone Encounter (Signed)
Pt reports swelling both legs. States "Has had for a long time." States left leg with increased edema since Thursday. States edema  is from "Knee to hip" of left leg. Also reports "My ankles are like grapefruits, the doctors just keep saying to elevate them."  Denies any redness, warmth. States painful only in mornings "When I get up to walk." States tylenol and Aleve effective for pain.  Pt is evasive historian.   States was at therapy this afternoon and "Couldn't lift it straight up, but could from my side."  States therapy was "for my bladder."  States left leg "Gets tired after walking." Denies any SOB, does not recall any recent injury. After hours call; pt aware practice closed. Assured pt [place] would route to practice for Dr. Nathanial Millman review and consideration of appt. Advised to go to ED if symptoms worsen, any SOB occurs, calf pain, redness, warmth. Pt verbalizes understanding.  CB# [id number] Reason for Disposition . [1] MODERATE leg swelling (e.g., swelling extends up to knees) AND [2] new onset or worsening  Answer Assessment - Initial Assessment Questions 1. ONSET: "When did the swelling start?" (e.g., minutes, hours, days)     "Had fo ra long time, left leg worse now" 2. LOCATION: "What part of the leg is swollen?"  "Are both legs swollen or just one leg?"    Both legs, left >right 3. SEVERITY: "How bad is the swelling?" (e.g., localized; mild, moderate, severe)  - Localized - small area of swelling localized to one leg  - MILD pedal edema - swelling limited to foot and ankle, pitting edema < 1/4 inch (6 mm) deep, rest and elevation eliminate most or all swelling  - MODERATE edema - swelling of lower leg to knee, pitting edema > 1/4 inch (6 mm) deep, rest and elevation only partially reduce swelling  - SEVERE edema - swelling extends above knee, facial or hand swelling present      Pt could not verbalize. "They are both big." 4. REDNESS: "Does the swelling look red or  infected?"     no 5. PAIN: "Is the swelling painful to touch?" If so, ask: "How painful is it?"   (Scale 1-10; mild, moderate or severe)     Pain in mornings, "Hard to walk." 6. FEVER: "Do you have a fever?" If so, ask: "What is it, how was it measured, and when did it start?"      no 7. CAUSE: "What do you think is causing the leg swelling?"     I've been telling them for years, they don't know." 8. MEDICAL HISTORY: "Do you have a history of heart failure, kidney disease, liver failure, or cancer?"     9. RECURRENT SYMPTOM: "Have you had leg swelling before?" If so, ask: "When was the last time?" "What happened that time?"      10. OTHER SYMPTOMS: "Do you have any other symptoms?" (e.g., chest pain, difficulty breathing)      no  Protocols used: LEG SWELLING AND EDEMA-A-AH

## 2018-11-23 ENCOUNTER — Encounter: Payer: Self-pay | Admitting: Internal Medicine

## 2018-11-23 ENCOUNTER — Other Ambulatory Visit (INDEPENDENT_AMBULATORY_CARE_PROVIDER_SITE_OTHER): Payer: Medicare Other

## 2018-11-23 ENCOUNTER — Other Ambulatory Visit: Payer: Self-pay

## 2018-11-23 ENCOUNTER — Ambulatory Visit (INDEPENDENT_AMBULATORY_CARE_PROVIDER_SITE_OTHER): Payer: Medicare Other | Admitting: Internal Medicine

## 2018-11-23 VITALS — BP 154/80 | HR 102 | Temp 97.6°F | Ht 69.0 in | Wt 215.0 lb

## 2018-11-23 DIAGNOSIS — R29898 Other symptoms and signs involving the musculoskeletal system: Secondary | ICD-10-CM

## 2018-11-23 DIAGNOSIS — R0602 Shortness of breath: Secondary | ICD-10-CM | POA: Diagnosis not present

## 2018-11-23 DIAGNOSIS — I10 Essential (primary) hypertension: Secondary | ICD-10-CM

## 2018-11-23 DIAGNOSIS — E1142 Type 2 diabetes mellitus with diabetic polyneuropathy: Secondary | ICD-10-CM

## 2018-11-23 DIAGNOSIS — R29818 Other symptoms and signs involving the nervous system: Secondary | ICD-10-CM

## 2018-11-23 DIAGNOSIS — I2510 Atherosclerotic heart disease of native coronary artery without angina pectoris: Secondary | ICD-10-CM

## 2018-11-23 HISTORY — DX: Other symptoms and signs involving the musculoskeletal system: R29.898

## 2018-11-23 LAB — CBC
HCT: 44 % (ref 39.0–52.0)
Hemoglobin: 14.3 g/dL (ref 13.0–17.0)
MCHC: 32.6 g/dL (ref 30.0–36.0)
MCV: 100.5 fl — ABNORMAL HIGH (ref 78.0–100.0)
Platelets: 189 10*3/uL (ref 150.0–400.0)
RBC: 4.38 Mil/uL (ref 4.22–5.81)
RDW: 14.9 % (ref 11.5–15.5)
WBC: 7.6 10*3/uL (ref 4.0–10.5)

## 2018-11-23 LAB — HEMOGLOBIN A1C: Hgb A1c MFr Bld: 7.2 % — ABNORMAL HIGH (ref 4.6–6.5)

## 2018-11-23 LAB — BRAIN NATRIURETIC PEPTIDE: Pro B Natriuretic peptide (BNP): 18 pg/mL (ref 0.0–100.0)

## 2018-11-23 LAB — LIPID PANEL
Cholesterol: 111 mg/dL (ref 0–200)
HDL: 49.2 mg/dL (ref >39.00)
NonHDL: 61.98
Total CHOL/HDL Ratio: 2
Triglycerides: 203 mg/dL — ABNORMAL HIGH (ref 0.0–149.0)
VLDL: 40.6 mg/dL — ABNORMAL HIGH (ref 0.0–40.0)

## 2018-11-23 LAB — COMPREHENSIVE METABOLIC PANEL
ALT: 55 U/L — ABNORMAL HIGH (ref 0–53)
AST: 38 U/L — ABNORMAL HIGH (ref 0–37)
Albumin: 4.4 g/dL (ref 3.5–5.2)
Alkaline Phosphatase: 80 U/L (ref 39–117)
BUN: 14 mg/dL (ref 6–23)
CO2: 27 mEq/L (ref 19–32)
Calcium: 9.7 mg/dL (ref 8.4–10.5)
Chloride: 103 mEq/L (ref 96–112)
Creatinine, Ser: 1.17 mg/dL (ref 0.40–1.50)
GFR: 74.2 mL/min (ref >60.00)
Glucose, Bld: 114 mg/dL — ABNORMAL HIGH (ref 70–99)
Potassium: 3.9 mEq/L (ref 3.5–5.1)
Sodium: 141 mEq/L (ref 135–145)
Total Bilirubin: 0.4 mg/dL (ref 0.2–1.2)
Total Protein: 6.7 g/dL (ref 6.0–8.3)

## 2018-11-23 LAB — LDL CHOLESTEROL, DIRECT: Direct LDL: 40 mg/dL

## 2018-11-23 NOTE — Progress Notes (Signed)
   Subjective:   Patient ID: Christian [name], male    DOB: 1945/12/24, 73 y.o.   MRN: [id number]  HPI The patient is a 73 YO man coming in for concerns about left leg weakness (was working with PT yesterday and they were concerned about this, he did have pain in the left leg and hip over the weekend, he is having swelling a little worse in the left leg, chronic swelling, he was having a hard time lifting it up Sunday to now, he is not sure if this is improving, ms is in his medical history but remote and he does not know details, no prior MRI to review in imaging, he is having some weakness of the left leg, some numbness in the foot which is not new, denies numbness in the leg, denies weakness or numbness elsewhere, denies facial drooping or slurring speech, has chronic mild memory problems which he does not feel are different than usual, his PT advised him to come in rather strongly otherwise he would not have come) and follow up diabetes (not seen in more than 1 year, taking metformin without problems, complicated by neuropathy which is stable, does have some numbness in feet and hands), and blood pressure (recently meds adjusted by his cardiologist in April, BP is better today and he reports at goal at home, taking clonidine, imdur, metoprolol, olmesartan/hctz, verapamil, denies side effects, denies headaches or chest pains).   Review of Systems  Constitutional: Positive for activity change.  HENT: Negative.   Eyes: Negative.   Respiratory: Negative for cough, chest tightness and shortness of breath.   Cardiovascular: Negative for chest pain, palpitations and leg swelling.  Gastrointestinal: Negative for abdominal distention, abdominal pain, constipation, diarrhea, nausea and vomiting.  Musculoskeletal: Positive for arthralgias and gait problem.  Skin: Negative.   Neurological: Positive for weakness and numbness.  Psychiatric/Behavioral: Negative.     Objective:  Physical Exam  Constitutional:      Appearance: He is well-developed. He is obese.  HENT:     Head: Normocephalic and atraumatic.  Neck:     Musculoskeletal: Normal range of motion.  Cardiovascular:     Rate and Rhythm: Normal rate and regular rhythm.  Pulmonary:     Effort: Pulmonary effort is normal. No respiratory distress.     Breath sounds: Normal breath sounds. No wheezing or rales.  Abdominal:     General: Bowel sounds are normal. There is no distension.     Palpations: Abdomen is soft.     Tenderness: There is no abdominal tenderness. There is no rebound.  Musculoskeletal:     Comments: 1+ edema left leg which is slightly worse compared to right leg, no skin color changes  Skin:    General: Skin is warm and dry.  Neurological:     Mental Status: He is alert and oriented to person, place, and time.     Coordination: Coordination abnormal.     Comments: Gait slow, needs effort to stand, left leg weak on exam compared to right but effort questionable     Vitals:   11/23/18 1051  BP: (!) 154/80  Pulse: (!) 102  Temp: 97.6 F (36.4 C)  TempSrc: Oral  SpO2: 96%  Weight: 215 lb (97.5 kg)  Height: 5\' 9"  (1.753 m)    Assessment & Plan:

## 2018-11-23 NOTE — Telephone Encounter (Signed)
Noted  

## 2018-11-23 NOTE — Patient Instructions (Signed)
We are going to check blood work today to look for any problems.   We are also going to get an MRI scan of the brain to look for any stroke causing the left leg weakness.

## 2018-11-23 NOTE — Assessment & Plan Note (Signed)
BP closer to goal in office and he states at goal at home on his clonidine, metoprolol, imdur, olmesartan/hctz, verapamil. Checking CMP and adjust as needed as no labs in more than 1 year.

## 2018-11-23 NOTE — Assessment & Plan Note (Signed)
Checking HgA1c today, adjust metformin as needed. On ARB and statin. Foot exam done with neuropathy stable.

## 2018-11-23 NOTE — Telephone Encounter (Signed)
Appt scheduled

## 2018-11-23 NOTE — Assessment & Plan Note (Signed)
Could be related to pain in the hip and swelling. However given known CAD need to rule out stroke. Given that this started 3-4 days ago will order MRI non-emergent. Checking labs today to check on the status of chronic problems.

## 2018-11-24 DIAGNOSIS — N3941 Urge incontinence: Secondary | ICD-10-CM | POA: Diagnosis not present

## 2018-11-24 DIAGNOSIS — R3915 Urgency of urination: Secondary | ICD-10-CM | POA: Diagnosis not present

## 2018-11-24 DIAGNOSIS — R350 Frequency of micturition: Secondary | ICD-10-CM | POA: Diagnosis not present

## 2018-12-02 DIAGNOSIS — E669 Obesity, unspecified: Secondary | ICD-10-CM | POA: Insufficient documentation

## 2018-12-05 ENCOUNTER — Ambulatory Visit (INDEPENDENT_AMBULATORY_CARE_PROVIDER_SITE_OTHER): Payer: Medicare Other | Admitting: Cardiology

## 2018-12-05 ENCOUNTER — Other Ambulatory Visit: Payer: Self-pay

## 2018-12-05 ENCOUNTER — Encounter: Payer: Self-pay | Admitting: Cardiology

## 2018-12-05 VITALS — BP 165/92 | HR 97 | Ht 69.0 in | Wt 219.0 lb

## 2018-12-05 DIAGNOSIS — Z9861 Coronary angioplasty status: Secondary | ICD-10-CM | POA: Diagnosis not present

## 2018-12-05 DIAGNOSIS — E7800 Pure hypercholesterolemia, unspecified: Secondary | ICD-10-CM | POA: Diagnosis not present

## 2018-12-05 DIAGNOSIS — I2510 Atherosclerotic heart disease of native coronary artery without angina pectoris: Secondary | ICD-10-CM | POA: Diagnosis not present

## 2018-12-05 DIAGNOSIS — E669 Obesity, unspecified: Secondary | ICD-10-CM | POA: Diagnosis not present

## 2018-12-05 DIAGNOSIS — I119 Hypertensive heart disease without heart failure: Secondary | ICD-10-CM | POA: Diagnosis not present

## 2018-12-05 MED ORDER — CLONIDINE HCL 0.2 MG PO TABS: 0.2000 mg | ORAL_TABLET | Freq: Every day | ORAL | 6 refills | Status: DC

## 2018-12-05 NOTE — Progress Notes (Signed)
Subjective:  Primary Physician:  Hoyt Koch, MD  Patient ID: Christian [name], male    DOB: 12-10-1945, 73 y.o.   MRN: [id number]  Chief Complaint  Patient presents with  . Coronary Artery Disease    4 month f/u, leg pain, lack of energy    HPI: Christian [name]  is a 73 y.o. male  who presents for  follow-up for CAD. Coronary angiogram 02/27/2014 - Stenting PDA with 3.0 x 28 mm Promus Premier DES. Left coronaries mild disease. LVEF 60%.  Pt. denies chest pain. He has shortness of breath on walking for about half block, unchanged from before. No history of orthopnea or PND. No history of hematuria, GI bleed or any other bleeding. He denies any dark stools.  No History of palpitation, dizziness, near syncope or syncope. He has mild chronic swelling on left leg, no h/o claudication. No history of diffuse muscle aching, soreness or diffuse muscle weakness. He is complaining of feeling weak, tired and no energy.  For past 3 months, patient is complaining of feeling mild weakness and numbness in the left upper and lower extremity. He was seen by PCP and has been scheduled for MRI.  Patient has hypertension, hypercholesterolemia, diabetes and obesity. He does not smoke.  Past Medical History:  Diagnosis Date  . BPH (benign prostatic hypertrophy)   . Coronary artery disease   . Diabetes mellitus without complication ([place])   . Dry eyes left  . Hiatal hernia   . Hyperlipidemia   . Hypertension   . Incomplete bladder emptying   . Nocturia   . 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 SOB (shortness of breath) on exertion     Past Surgical History:  Procedure Laterality Date  . CARDIAC CATHETERIZATION    . CORONARY STENT PLACEMENT  02/27/2014   distal rt/pd coronary       dr Einar Gip  . CYSTO/ BLADDER BIOPSY'S/ CAUTHERIZATION  01-14-2004  DR Gaynelle Arabian  . LEFT HEART  CATHETERIZATION WITH CORONARY ANGIOGRAM N/A 02/27/2014   Procedure: LEFT HEART CATHETERIZATION WITH CORONARY ANGIOGRAM;  Surgeon: Laverda Page, MD;  Location: [facility] CATH LAB;  Service: Cardiovascular;  Laterality: N/A;  . PERCUTANEOUS CORONARY STENT INTERVENTION (PCI-S)  02/27/2014   Procedure: PERCUTANEOUS CORONARY STENT INTERVENTION (PCI-S);  Surgeon: Laverda Page, MD;  Location: [facility] CATH LAB;  Service: Cardiovascular;;  rt PDA  3.0/28mm Promus stent  . TRANSURETHRAL RESECTION OF PROSTATE  04/04/2012   Procedure: TRANSURETHRAL RESECTION OF THE PROSTATE WITH GYRUS INSTRUMENTS;  Surgeon: Ailene Rud, MD;  Location: [facility];  Service: Urology;  Laterality: N/A;  . TRANSURETHRAL RESECTION OF PROSTATE N/A 09/27/2014   Procedure: TRANSURETHRAL RESECTION OF THE PROSTATE ;  Surgeon: Carolan Clines, MD;  Location: [facility];  Service: Urology;  Laterality: N/A;  . UPPER GASTROINTESTINAL ENDOSCOPY      Social History   Socioeconomic History  . Marital status: Married    Spouse name: Not on file  . Number of children: 2  . Years of education: Not on file  . Highest education level: Not on file  Occupational History  . Not on file  Social Needs  . Financial resource strain: Not hard at all  . Food insecurity    Worry: Never true    Inability: Never true  . Transportation needs    Medical: No  Non-medical: No  Tobacco Use  . Smoking status: Former Smoker    Packs/day: 0.50    Years: 20.00    Pack years: 10.00    Types: Cigarettes    Quit date: 03/28/1990    Years since quitting: 28.7  . Smokeless tobacco: Never Used  Substance and Sexual Activity  . Alcohol use: Yes    Alcohol/week: 0.0 standard drinks    Comment: 1 beer week rarely  . Drug use: No  . Sexual activity: Not Currently  Lifestyle  . Physical activity    Days per week: 0 days    Minutes per session: 0 min  . Stress: Only a little  Relationships  . Social connections    Talks on phone:  More than three times a week    Gets together: More than three times a week    Attends religious service: More than 4 times per year    Active member of club or organization: Yes    Attends meetings of clubs or organizations: More than 4 times per year    Relationship status: Married  . Intimate partner violence    Fear of current or ex partner: No    Emotionally abused: No    Physically abused: No    Forced sexual activity: No  Other Topics Concern  . Not on file  Social History Narrative  . Not on file    Current Outpatient Medications on File Prior to Visit  Medication Sig Dispense Refill  . atorvastatin (LIPITOR) 20 MG tablet Take 20 mg by mouth every evening.     . cloNIDine (CATAPRES) 0.1 MG tablet TAKE 1 TABLET BY MOUTH EVERY DAY 90 tablet 2  . clopidogrel (PLAVIX) 75 MG tablet Take 1 tablet (75 mg total) by mouth daily with breakfast. 30 tablet 6  . cromolyn (OPTICROM) 4 % ophthalmic solution 1 DROP IN BOTH EYES FOUR TIMES A DAY AS NEEDED  12  . isosorbide mononitrate (IMDUR) 60 MG 24 hr tablet TAKE 1 TABLET BY MOUTH EVERY DAY 90 tablet 0  . metFORMIN (GLUCOPHAGE) 1000 MG tablet TAKE 1 TABLET TWICE A DAY WITH A MEAL 180 tablet 1  . metoprolol succinate (TOPROL-XL) 100 MG 24 hr tablet TAKE 1 TABLET BY MOUTH EVERY DAY 90 tablet 0  . nitroGLYCERIN (NITROSTAT) 0.4 MG SL tablet Place 0.4 mg under the tongue every 5 (five) minutes as needed for chest pain.    Marland Kitchen olmesartan-hydrochlorothiazide (BENICAR HCT) 40-25 MG tablet TAKE 1 TABLET BY MOUTH EVERY DAY 90 tablet 2  . PREVIDENT 5000 BOOSTER PLUS 1.1 % PSTE APPLY A THIN RIBBON TO TOOTHBRUSH AND BRUSH TEETH FOR 2 MINUTES, USE DAILY    . verapamil (VERELAN PM) 240 MG 24 hr capsule TAKE 1 CAPSULE BY MOUTH EVERY DAY (Patient taking differently: Take 240 mg by mouth at bedtime. ) 90 capsule 2  . tamsulosin (FLOMAX) 0.4 MG CAPS capsule Take 0.4 mg by mouth daily after breakfast.      No current facility-administered medications on file  prior to visit.     Review of Systems  Constitutional: Negative for fever.  HENT: Negative for nosebleeds.   Eyes: Negative for blurred vision.  Respiratory: Positive for shortness of breath (on exertion). Negative for cough.   Cardiovascular: Positive for leg swelling (mild on left leg- chronic).  Gastrointestinal: Negative for abdominal pain, nausea and vomiting.  Genitourinary: Negative for dysuria.  Musculoskeletal: Negative for myalgias.  Skin: Negative for itching and rash.  Neurological: Positive for focal  weakness (left upper and lower extremity). Negative for dizziness and loss of consciousness.  Psychiatric/Behavioral: The patient is not nervous/anxious.       Objective:  Blood pressure (!) 165/92, pulse 97, height 5\' 9"  (1.753 m), weight 219 lb (99.3 kg), SpO2 95 %. Body mass index is 32.34 kg/m.  Physical Exam  Constitutional: He is oriented to person, place, and time. He appears well-developed.  Obese  HENT:  Head: Normocephalic and atraumatic.  Eyes: Conjunctivae are normal.  Neck: No thyromegaly present.  Cardiovascular: Normal rate and regular rhythm. Exam reveals gallop (S4 at apex).  No murmur heard. Pulses:      Carotid pulses are 2+ on the right side and 2+ on the left side.      Femoral pulses are 2+ on the right side and 2+ on the left side.      Dorsalis pedis pulses are 1+ on the right side and 2+ on the left side.       Posterior tibial pulses are 1+ on the right side and 2+ on the left side.  Pulmonary/Chest: He has no wheezes. He has no rales.  Abdominal: He exhibits no mass. There is no abdominal tenderness.  Musculoskeletal:        General: Edema (mild, left leg, chronic.) present.  Lymphadenopathy:    He has no cervical adenopathy.  Neurological: He is alert and oriented to person, place, and time.  Gr. 4 power Lt. Upper and lower extremity, mild numbness in left hand and foot.  Skin: Skin is warm.   CARDIAC STUDIES:  Coronary angiogram  02/27/2014: Stenting PDA with 3.0 x 28 mm Promus Premier DES. Left coronaries mild disease. LVEF 60%.  Echocardiogram 09/17/2017: Left ventricle cavity is normal in size. Mild concentric hypertrophy of the left ventricle. Normal global wall motion. Doppler evidence of grade I (impaired) diastolic dysfunction. Visual EF is 50-55%. Calculated EF 54%. No aortic valve regurgitation noted. Mild aortic valve leaflet thickening. Mild tricuspid regurgitation. No evidence of pulmonary hypertension. Compared to 06/26/2014, no significant change.  Lexiscan myoview stress test 04/13/2016: 1. The resting electrocardiogram demonstrated normal sinus rhythm, normal resting conduction, no resting arrhythmias and normal rest repolarization. Stress EKG is non-diagnostic for ischemia as it a pharmacologic stress using Lexiscan. Stress symptoms included dyspnea and chest discomfort. Hypertensive at rest and stress with peak BP 192/110 mm Hg. 2. Perfusion imaging study demonstrates soft tissue attenuation artifact in the inferior wall. There is no demonstrable ischemia or scar. LV systolic function was normal at 50% but visually appears to be more than 50%. This is a low risk study. Compared to the study 10/24/2012, small sized inferior ischemia is no longer present. This represents a low risk study.   Assessment & Recommendations:  Atherosclerosis of native coronary artery of native heart without angina pectoris - Plan: EKG 12-Lead- Sinus  Rhythm  WITHIN NORMAL LIMITS  S/P PTCA (percutaneous transluminal coronary angioplasty)   Hypertension with heart disease   Hypercholesterolemia   Obesity (BMI 30-39.9)  Laboratory Exam:  CBC Latest Ref Rng & Units 11/23/2018 10/19/2017 03/29/2017  WBC 4.0 - 10.5 K/uL 7.6 7.7 8.8  Hemoglobin 13.0 - 17.0 g/dL 14.3 14.1 13.8  Hematocrit 39.0 - 52.0 % 44.0 42.1 42.1  Platelets 150.0 - 400.0 K/uL 189.0 213.0 225.0   CMP Latest Ref Rng & Units 11/23/2018 10/19/2017 03/29/2017   Glucose 70 - 99 mg/dL 114(H) 172(H) 83  BUN 6 - 23 mg/dL 14 17 13   Creatinine 0.40 - 1.50 mg/dL 1.17  1.19 1.19  Sodium 135 - 145 mEq/L 141 138 141  Potassium 3.5 - 5.1 mEq/L 3.9 3.7 4.2  Chloride 96 - 112 mEq/L 103 101 103  CO2 19 - 32 mEq/L 27 26 30   Calcium 8.4 - 10.5 mg/dL 9.7 9.6 10.3  Total Protein 6.0 - 8.3 g/dL 6.7 6.7 7.1  Total Bilirubin 0.2 - 1.2 mg/dL 0.4 0.3 0.3  Alkaline Phos 39 - 117 U/L 80 78 74  AST 0 - 37 U/L 38(H) 22 22  ALT 0 - 53 U/L 55(H) 34 32   Lipid Panel  03/30/2018-cholesterol-116, HDL-51, LDL-41, triglycerides-120. Borderline elevated SGPT, otherwise normal CMP.  Normal CBC and normal TSH.     Component Value Date/Time   CHOL 111 11/23/2018 1120   TRIG 203.0 (H) 11/23/2018 1120   HDL 49.20 11/23/2018 1120   CHOLHDL 2 11/23/2018 1120   VLDL 40.6 (H) 11/23/2018 1120   LDLCALC 37 08/27/2016 1343   LDLDIRECT 40.0 11/23/2018 1120   Recommendation:  CAD is stable, no angina. His blood pressure is high, I have increased the dose of clonidine to 0.2 mg daily.  He will continue other present medications.Since he did not have any angina for a long time, I have advised him to stop isosorbide. He was also advised to monitor blood pressure at home and call us if it stays high.   I have advised him to keep the brain MRI appointment and have follow-up with PCP.  Secondary prevention was again discussed. He was advised to follow strict diet to lose weight. Continue ADA, low salt, low cholesterol diet.  Return for follow-up after 4 months but call us earlier if there are any cardiac problems. Total time spent with patient was 30  minutes and greater than 50% of that time was spent face to face in counseling and coordination of care with the patient regarding his condition, management, explaining side effects of the medicine,dietary counseling etc.                              " Despina Hick, MD, [facility] 12/05/2018, 11:17 AM [facility] [place] Pager:  [phone] Office: [phone] If no answer Cell [phone]

## 2018-12-06 DIAGNOSIS — G8194 Hemiplegia, unspecified affecting left nondominant side: Secondary | ICD-10-CM | POA: Insufficient documentation

## 2018-12-08 ENCOUNTER — Other Ambulatory Visit: Payer: Self-pay

## 2018-12-08 MED ORDER — ATORVASTATIN CALCIUM 20 MG PO TABS: 20.0000 mg | ORAL_TABLET | Freq: Every evening | ORAL | 1 refills | Status: DC

## 2018-12-13 DIAGNOSIS — M6281 Muscle weakness (generalized): Secondary | ICD-10-CM | POA: Diagnosis not present

## 2018-12-13 DIAGNOSIS — N393 Stress incontinence (female) (male): Secondary | ICD-10-CM | POA: Diagnosis not present

## 2018-12-13 DIAGNOSIS — M62838 Other muscle spasm: Secondary | ICD-10-CM | POA: Diagnosis not present

## 2018-12-15 DIAGNOSIS — R350 Frequency of micturition: Secondary | ICD-10-CM | POA: Diagnosis not present

## 2018-12-15 DIAGNOSIS — N3941 Urge incontinence: Secondary | ICD-10-CM | POA: Diagnosis not present

## 2018-12-26 ENCOUNTER — Ambulatory Visit
Admission: RE | Admit: 2018-12-26 | Discharge: 2018-12-26 | Disposition: A | Discharge status: Home / Self Care | Payer: Medicare Other | Source: Ambulatory Visit | Attending: Internal Medicine | Admitting: Internal Medicine

## 2018-12-26 ENCOUNTER — Other Ambulatory Visit: Payer: Self-pay

## 2018-12-26 DIAGNOSIS — R29818 Other symptoms and signs involving the nervous system: Secondary | ICD-10-CM

## 2018-12-26 DIAGNOSIS — M4802 Spinal stenosis, cervical region: Secondary | ICD-10-CM | POA: Diagnosis not present

## 2018-12-26 DIAGNOSIS — G9389 Other specified disorders of brain: Secondary | ICD-10-CM | POA: Diagnosis not present

## 2018-12-26 DIAGNOSIS — J3489 Other specified disorders of nose and nasal sinuses: Secondary | ICD-10-CM | POA: Diagnosis not present

## 2018-12-26 DIAGNOSIS — M4602 Spinal enthesopathy, cervical region: Secondary | ICD-10-CM | POA: Diagnosis not present

## 2018-12-26 DIAGNOSIS — G319 Degenerative disease of nervous system, unspecified: Secondary | ICD-10-CM | POA: Diagnosis not present

## 2018-12-26 DIAGNOSIS — M5030 Other cervical disc degeneration, unspecified cervical region: Secondary | ICD-10-CM | POA: Diagnosis not present

## 2018-12-27 ENCOUNTER — Other Ambulatory Visit: Payer: Self-pay | Admitting: Internal Medicine

## 2018-12-27 DIAGNOSIS — R29898 Other symptoms and signs involving the musculoskeletal system: Secondary | ICD-10-CM

## 2018-12-28 ENCOUNTER — Ambulatory Visit (INDEPENDENT_AMBULATORY_CARE_PROVIDER_SITE_OTHER)
Admission: RE | Admit: 2018-12-28 | Discharge: 2018-12-28 | Disposition: A | Discharge status: Home / Self Care | Payer: Medicare Other | Source: Ambulatory Visit | Attending: Internal Medicine | Admitting: Internal Medicine

## 2018-12-28 ENCOUNTER — Telehealth: Payer: Self-pay | Admitting: *Deleted

## 2018-12-28 ENCOUNTER — Other Ambulatory Visit: Payer: Self-pay

## 2018-12-28 DIAGNOSIS — M545 Low back pain: Secondary | ICD-10-CM | POA: Diagnosis not present

## 2018-12-28 DIAGNOSIS — R29898 Other symptoms and signs involving the musculoskeletal system: Secondary | ICD-10-CM

## 2018-12-28 NOTE — Telephone Encounter (Signed)
Received call from [facility] at [facility].  He wanted to make sure patient's xray of his lumbar spine "crossed over" into our system.  Advised him that I can view the xray and results in Epic.  He wants to make sure Dr. Sharlet Salina sees results today or tomorrow.  Advised I will notify office that xray results are available. Marland Kitchen

## 2018-12-29 ENCOUNTER — Other Ambulatory Visit (INDEPENDENT_AMBULATORY_CARE_PROVIDER_SITE_OTHER): Payer: Medicare Other

## 2018-12-29 ENCOUNTER — Other Ambulatory Visit: Payer: Self-pay | Admitting: Internal Medicine

## 2018-12-29 DIAGNOSIS — R9389 Abnormal findings on diagnostic imaging of other specified body structures: Secondary | ICD-10-CM

## 2018-12-29 LAB — CBC WITH DIFFERENTIAL/PLATELET
Basophils Absolute: 0 10*3/uL (ref 0.0–0.1)
Basophils Relative: 0.5 % (ref 0.0–3.0)
Eosinophils Absolute: 0.1 10*3/uL (ref 0.0–0.7)
Eosinophils Relative: 1.7 % (ref 0.0–5.0)
HCT: 44.2 % (ref 39.0–52.0)
Hemoglobin: 14.7 g/dL (ref 13.0–17.0)
Lymphocytes Relative: 17.8 % (ref 12.0–46.0)
Lymphs Abs: 1.5 10*3/uL (ref 0.7–4.0)
MCHC: 33.2 g/dL (ref 30.0–36.0)
MCV: 100.6 fl — ABNORMAL HIGH (ref 78.0–100.0)
Monocytes Absolute: 0.8 10*3/uL (ref 0.1–1.0)
Monocytes Relative: 9.3 % (ref 3.0–12.0)
Neutro Abs: 6.1 10*3/uL (ref 1.4–7.7)
Neutrophils Relative %: 70.7 % (ref 43.0–77.0)
Platelets: 213 10*3/uL (ref 150.0–400.0)
RBC: 4.39 Mil/uL (ref 4.22–5.81)
RDW: 14.6 % (ref 11.5–15.5)
WBC: 8.7 10*3/uL (ref 4.0–10.5)

## 2018-12-29 LAB — SEDIMENTATION RATE: Sed Rate: 13 mm/hr (ref 0–20)

## 2018-12-29 NOTE — Telephone Encounter (Signed)
Noted, available

## 2019-01-09 DIAGNOSIS — Z23 Encounter for immunization: Secondary | ICD-10-CM | POA: Diagnosis not present

## 2019-01-11 DIAGNOSIS — M6281 Muscle weakness (generalized): Secondary | ICD-10-CM | POA: Diagnosis not present

## 2019-01-11 DIAGNOSIS — N393 Stress incontinence (female) (male): Secondary | ICD-10-CM | POA: Diagnosis not present

## 2019-01-11 DIAGNOSIS — M62838 Other muscle spasm: Secondary | ICD-10-CM | POA: Diagnosis not present

## 2019-01-12 DIAGNOSIS — N3941 Urge incontinence: Secondary | ICD-10-CM | POA: Diagnosis not present

## 2019-01-12 DIAGNOSIS — R3915 Urgency of urination: Secondary | ICD-10-CM | POA: Diagnosis not present

## 2019-01-12 DIAGNOSIS — R350 Frequency of micturition: Secondary | ICD-10-CM | POA: Diagnosis not present

## 2019-01-17 ENCOUNTER — Other Ambulatory Visit: Payer: Self-pay

## 2019-01-17 MED ORDER — METOPROLOL SUCCINATE ER 100 MG PO TB24: 100.0000 mg | ORAL_TABLET | Freq: Every day | ORAL | 0 refills | Status: DC

## 2019-01-31 DIAGNOSIS — N3946 Mixed incontinence: Secondary | ICD-10-CM | POA: Diagnosis not present

## 2019-01-31 DIAGNOSIS — M62838 Other muscle spasm: Secondary | ICD-10-CM | POA: Diagnosis not present

## 2019-01-31 DIAGNOSIS — R3915 Urgency of urination: Secondary | ICD-10-CM | POA: Diagnosis not present

## 2019-01-31 DIAGNOSIS — R351 Nocturia: Secondary | ICD-10-CM | POA: Diagnosis not present

## 2019-01-31 DIAGNOSIS — R3912 Poor urinary stream: Secondary | ICD-10-CM | POA: Diagnosis not present

## 2019-01-31 DIAGNOSIS — M6281 Muscle weakness (generalized): Secondary | ICD-10-CM | POA: Diagnosis not present

## 2019-02-01 ENCOUNTER — Ambulatory Visit (INDEPENDENT_AMBULATORY_CARE_PROVIDER_SITE_OTHER): Payer: Medicare Other | Admitting: Podiatry

## 2019-02-01 ENCOUNTER — Encounter: Payer: Self-pay | Admitting: Podiatry

## 2019-02-01 ENCOUNTER — Other Ambulatory Visit: Payer: Self-pay

## 2019-02-01 DIAGNOSIS — M79675 Pain in left toe(s): Secondary | ICD-10-CM

## 2019-02-01 DIAGNOSIS — M79674 Pain in right toe(s): Secondary | ICD-10-CM | POA: Diagnosis not present

## 2019-02-01 DIAGNOSIS — B351 Tinea unguium: Secondary | ICD-10-CM | POA: Diagnosis not present

## 2019-02-01 NOTE — Patient Instructions (Signed)
Diabetes Mellitus and Foot Care Foot care is an important part of your health, especially when you have diabetes. Diabetes may cause you to have problems because of poor blood flow (circulation) to your feet and legs, which can cause your skin to:  Become thinner and drier.  Break more easily.  Heal more slowly.  Peel and crack. You may also have nerve damage (neuropathy) in your legs and feet, causing decreased feeling in them. This means that you may not notice minor injuries to your feet that could lead to more serious problems. Noticing and addressing any potential problems early is the best way to prevent future foot problems. How to care for your feet Foot hygiene  Wash your feet daily with warm water and mild soap. Do not use hot water. Then, pat your feet and the areas between your toes until they are completely dry. Do not soak your feet as this can dry your skin.  Trim your toenails straight across. Do not dig under them or around the cuticle. File the edges of your nails with an emery board or nail file.  Apply a moisturizing lotion or petroleum jelly to the skin on your feet and to dry, brittle toenails. Use lotion that does not contain alcohol and is unscented. Do not apply lotion between your toes. Shoes and socks  Wear clean socks or stockings every day. Make sure they are not too tight. Do not wear knee-high stockings since they may decrease blood flow to your legs.  Wear shoes that fit properly and have enough cushioning. Always look in your shoes before you put them on to be sure there are no objects inside.  To break in new shoes, wear them for just a few hours a day. This prevents injuries on your feet. Wounds, scrapes, corns, and calluses  Check your feet daily for blisters, cuts, bruises, sores, and redness. If you cannot see the bottom of your feet, use a mirror or ask someone for help.  Do not cut corns or calluses or try to remove them with medicine.  If you  find a minor scrape, cut, or break in the skin on your feet, keep it and the skin around it clean and dry. You may clean these areas with mild soap and water. Do not clean the area with peroxide, alcohol, or iodine.  If you have a wound, scrape, corn, or callus on your foot, look at it several times a day to make sure it is healing and not infected. Check for: ? Redness, swelling, or pain. ? Fluid or blood. ? Warmth. ? Pus or a bad smell. General instructions  Do not cross your legs. This may decrease blood flow to your feet.  Do not use heating pads or hot water bottles on your feet. They may burn your skin. If you have lost feeling in your feet or legs, you may not know this is happening until it is too late.  Protect your feet from hot and cold by wearing shoes, such as at the beach or on hot pavement.  Schedule a complete foot exam at least once a year (annually) or more often if you have foot problems. If you have foot problems, report any cuts, sores, or bruises to your health care provider immediately. Contact a health care provider if:  You have a medical condition that increases your risk of infection and you have any cuts, sores, or bruises on your feet.  You have an injury that is not   healing.  You have redness on your legs or feet.  You feel burning or tingling in your legs or feet.  You have pain or cramps in your legs and feet.  Your legs or feet are numb.  Your feet always feel cold.  You have pain around a toenail. Get help right away if:  You have a wound, scrape, corn, or callus on your foot and: ? You have pain, swelling, or redness that gets worse. ? You have fluid or blood coming from the wound, scrape, corn, or callus. ? Your wound, scrape, corn, or callus feels warm to the touch. ? You have pus or a bad smell coming from the wound, scrape, corn, or callus. ? You have a fever. ? You have a red line going up your leg. Summary  Check your feet every day  for cuts, sores, red spots, swelling, and blisters.  Moisturize feet and legs daily.  Wear shoes that fit properly and have enough cushioning.  If you have foot problems, report any cuts, sores, or bruises to your health care provider immediately.  Schedule a complete foot exam at least once a year (annually) or more often if you have foot problems. This information is not intended to replace advice given to you by your health care provider. Make sure you discuss any questions you have with your health care provider. Document Released: 04/10/2000 Document Revised: 05/26/2017 Document Reviewed: 05/15/2016 Elsevier Patient Education  2020 Elsevier Inc.   Onychomycosis/Fungal Toenails  WHAT IS IT? An infection that lies within the keratin of your nail plate that is caused by a fungus.  WHY ME? Fungal infections affect all ages, sexes, races, and creeds.  There may be many factors that predispose you to a fungal infection such as age, coexisting medical conditions such as diabetes, or an autoimmune disease; stress, medications, fatigue, genetics, etc.  Bottom line: fungus thrives in a warm, moist environment and your shoes offer such a location.  IS IT CONTAGIOUS? Theoretically, yes.  You do not want to share shoes, nail clippers or files with someone who has fungal toenails.  Walking around barefoot in the same room or sleeping in the same bed is unlikely to transfer the organism.  It is important to realize, however, that fungus can spread easily from one nail to the next on the same foot.  HOW DO WE TREAT THIS?  There are several ways to treat this condition.  Treatment may depend on many factors such as age, medications, pregnancy, liver and kidney conditions, etc.  It is best to ask your doctor which options are available to you.  1. No treatment.   Unlike many other medical concerns, you can live with this condition.  However for many people this can be a painful condition and may lead to  ingrown toenails or a bacterial infection.  It is recommended that you keep the nails cut short to help reduce the amount of fungal nail. 2. Topical treatment.  These range from herbal remedies to prescription strength nail lacquers.  About 40-50% effective, topicals require twice daily application for approximately 9 to 12 months or until an entirely new nail has grown out.  The most effective topicals are medical grade medications available through physicians offices. 3. Oral antifungal medications.  With an 80-90% cure rate, the most common oral medication requires 3 to 4 months of therapy and stays in your system for a year as the new nail grows out.  Oral antifungal medications do require   blood work to make sure it is a safe drug for you.  A liver function panel will be performed prior to starting the medication and after the first month of treatment.  It is important to have the blood work performed to avoid any harmful side effects.  In general, this medication safe but blood work is required. 4. Laser Therapy.  This treatment is performed by applying a specialized laser to the affected nail plate.  This therapy is noninvasive, fast, and non-painful.  It is not covered by insurance and is therefore, out of pocket.  The results have been very good with a 80-95% cure rate.  [facility] is the only practice in the area to offer this therapy. 5. Permanent Nail Avulsion.  Removing the entire nail so that a new nail will not grow back. 

## 2019-02-02 DIAGNOSIS — N3941 Urge incontinence: Secondary | ICD-10-CM | POA: Diagnosis not present

## 2019-02-05 NOTE — Progress Notes (Addendum)
Subjective:  Christian [name] presents to clinic today with cc of  painful, thick, discolored, elongated toenails b/l that become tender and cannot cut because of thickness.  Pain is aggravated when wearing enclosed shoe gear and relieved with periodic professional debridement.  He remains on blood thinner, Plavix.  Hoyt Koch, MD is his PCP.  Current Outpatient Medications on File Prior to Visit  Medication Sig Dispense Refill  . aspirin EC 81 MG tablet Take 81 mg by mouth daily.    Marland Kitchen atorvastatin (LIPITOR) 20 MG tablet Take 1 tablet (20 mg total) by mouth every evening. 90 tablet 1  . cloNIDine (CATAPRES) 0.2 MG tablet Take 1 tablet (0.2 mg total) by mouth daily. 30 tablet 6  . clopidogrel (PLAVIX) 75 MG tablet Take 1 tablet (75 mg total) by mouth daily with breakfast. 30 tablet 6  . cromolyn (OPTICROM) 4 % ophthalmic solution 1 DROP IN BOTH EYES FOUR TIMES A DAY AS NEEDED  12  . metFORMIN (GLUCOPHAGE) 1000 MG tablet TAKE 1 TABLET TWICE A DAY WITH A MEAL 180 tablet 1  . metoprolol succinate (TOPROL-XL) 100 MG 24 hr tablet Take 1 tablet (100 mg total) by mouth daily. Take with or immediately following a meal. 90 tablet 0  . nitroGLYCERIN (NITROSTAT) 0.4 MG SL tablet Place 0.4 mg under the tongue every 5 (five) minutes as needed for chest pain.    Marland Kitchen olmesartan-hydrochlorothiazide (BENICAR HCT) 40-25 MG tablet TAKE 1 TABLET BY MOUTH EVERY DAY 90 tablet 2  . PREVIDENT 5000 BOOSTER PLUS 1.1 % PSTE APPLY A THIN RIBBON TO TOOTHBRUSH AND BRUSH TEETH FOR 2 MINUTES, USE DAILY    . tamsulosin (FLOMAX) 0.4 MG CAPS capsule Take 0.4 mg by mouth daily after breakfast.     . verapamil (VERELAN PM) 240 MG 24 hr capsule TAKE 1 CAPSULE BY MOUTH EVERY DAY (Patient taking differently: Take 240 mg by mouth at bedtime. ) 90 capsule 2   No current facility-administered medications on file prior to visit.      No Known Allergies   Objective: Physical Examination:  Vascular Examination: Capillary  refill time immediate x 10 digits.  Palpable DP/PT pulses b/l.  Digital hair present b/l.  No edema noted b/l.  Skin temperature gradient WNL b/l.  Dermatological Examination: Skin with normal turgor, texture and tone b/l.  No open wounds b/l.  No interdigital macerations noted b/l.  Elongated, thick, discolored brittle toenails with subungual debris and pain on dorsal palpation of nailbeds 1-5 right, 2-5 left. Anonychia left great toe with evidence of permanent total nail avulsion. Nailbed completely epithelialized and intact.  Musculoskeletal Examination: Muscle strength 5/5 to all muscle groups b/l.  No pain, crepitus or joint discomfort with active/passive ROM.  Neurological Examination: Sensation intact 5/5 b/l with 10 gram monofilament.  Vibratory sensation intact b/l.  Proprioceptive sensation intact b/l.  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 Toenails 1-5 right, 2-5 left were debrided in length and girth without iatrogenic laceration. 2.  Continue soft, supportive shoe gear daily. 3.  Report any pedal injuries to medical professional. 4.  Follow up 3 months. 5.  Patient/POA to call should there be a question/concern in there interim.

## 2019-02-06 ENCOUNTER — Telehealth: Payer: Self-pay

## 2019-02-06 NOTE — Telephone Encounter (Signed)
Copied from [place] [id number]. Topic: Referral - Request for Referral >> Feb 06, 2019 11:00 AM Erick Blinks wrote: Has patient seen PCP for this complaint? Yes.   *If NO, is insurance requiring patient see PCP for this issue before PCP can refer them? Referral for which specialty: Cardiology  Preferred provider/office: Highest recommended, within insurance network Reason for referral: Current Cardiologist is retiring

## 2019-02-06 NOTE — Telephone Encounter (Signed)
It would appear he already has an upcoming apt with Dr. Einar Gip who is in the same group as his prior cardiologist. Is he planning to switch to him? If so he does not need new referral.

## 2019-02-06 NOTE — Telephone Encounter (Signed)
Patient informed of MD response and said that was all he needed to know and thank you

## 2019-02-08 ENCOUNTER — Other Ambulatory Visit: Payer: Self-pay | Admitting: Internal Medicine

## 2019-02-21 DIAGNOSIS — R3915 Urgency of urination: Secondary | ICD-10-CM | POA: Diagnosis not present

## 2019-02-21 DIAGNOSIS — R351 Nocturia: Secondary | ICD-10-CM | POA: Diagnosis not present

## 2019-02-21 DIAGNOSIS — M62838 Other muscle spasm: Secondary | ICD-10-CM | POA: Diagnosis not present

## 2019-02-21 DIAGNOSIS — M6281 Muscle weakness (generalized): Secondary | ICD-10-CM | POA: Diagnosis not present

## 2019-02-21 DIAGNOSIS — R3912 Poor urinary stream: Secondary | ICD-10-CM | POA: Diagnosis not present

## 2019-02-21 DIAGNOSIS — N3946 Mixed incontinence: Secondary | ICD-10-CM | POA: Diagnosis not present

## 2019-03-28 DIAGNOSIS — R351 Nocturia: Secondary | ICD-10-CM | POA: Diagnosis not present

## 2019-03-28 DIAGNOSIS — M62838 Other muscle spasm: Secondary | ICD-10-CM | POA: Diagnosis not present

## 2019-03-28 DIAGNOSIS — N3946 Mixed incontinence: Secondary | ICD-10-CM | POA: Diagnosis not present

## 2019-03-28 DIAGNOSIS — M6281 Muscle weakness (generalized): Secondary | ICD-10-CM | POA: Diagnosis not present

## 2019-04-10 ENCOUNTER — Ambulatory Visit: Payer: Federal, State, Local not specified - PPO | Admitting: Cardiology

## 2019-04-13 ENCOUNTER — Other Ambulatory Visit: Payer: Self-pay

## 2019-04-13 DIAGNOSIS — I2510 Atherosclerotic heart disease of native coronary artery without angina pectoris: Secondary | ICD-10-CM

## 2019-04-13 MED ORDER — METOPROLOL SUCCINATE ER 100 MG PO TB24: 100.0000 mg | ORAL_TABLET | Freq: Every day | ORAL | 0 refills | Status: DC

## 2019-04-17 ENCOUNTER — Ambulatory Visit: Payer: Federal, State, Local not specified - PPO | Admitting: Cardiology

## 2019-04-18 ENCOUNTER — Other Ambulatory Visit: Payer: Self-pay

## 2019-04-18 ENCOUNTER — Encounter: Payer: Self-pay | Admitting: Cardiology

## 2019-04-18 ENCOUNTER — Ambulatory Visit (INDEPENDENT_AMBULATORY_CARE_PROVIDER_SITE_OTHER): Payer: Medicare Other | Admitting: Cardiology

## 2019-04-18 VITALS — BP 177/96 | HR 97 | Temp 96.6°F | Ht 69.0 in | Wt 201.0 lb

## 2019-04-18 DIAGNOSIS — I2510 Atherosclerotic heart disease of native coronary artery without angina pectoris: Secondary | ICD-10-CM

## 2019-04-18 DIAGNOSIS — R600 Localized edema: Secondary | ICD-10-CM | POA: Diagnosis not present

## 2019-04-18 DIAGNOSIS — I119 Hypertensive heart disease without heart failure: Secondary | ICD-10-CM

## 2019-04-18 DIAGNOSIS — E7800 Pure hypercholesterolemia, unspecified: Secondary | ICD-10-CM | POA: Diagnosis not present

## 2019-04-18 DIAGNOSIS — E1142 Type 2 diabetes mellitus with diabetic polyneuropathy: Secondary | ICD-10-CM

## 2019-04-18 MED ORDER — POTASSIUM CHLORIDE CRYS ER 20 MEQ PO TBCR: 20.0000 meq | EXTENDED_RELEASE_TABLET | Freq: Every day | ORAL | 2 refills | Status: DC

## 2019-04-18 MED ORDER — FUROSEMIDE 20 MG PO TABS: 20.0000 mg | ORAL_TABLET | Freq: Every day | ORAL | 2 refills | Status: DC

## 2019-04-18 NOTE — Progress Notes (Signed)
Primary Physician/Referring:  Hoyt Koch, MD  Patient ID: Christian [name], male    DOB: 1945-05-29, 73 y.o.   MRN: [id number]  Chief Complaint  Patient presents with  . Coronary Artery Disease    pt c/o numbness in hands  . Hypertension  . Numbness   HPI:    Christian [name]  is a 73 y.o. AA male  who presents for  follow-up for CAD, hypertension, hyperlipidemia.  He has history of stenting to the PDA on 02/27/2014. This is his 6 month visit. His main complaint today is marked fatigue especially since blood pressure medication changes and also continued chronic dyspnea. No PND or orthopnea His noticed leg edema is slightly getting worse.   Denies any chest pain, palpitations, dizziness.   Past Medical History:  Diagnosis Date  . BPH (benign prostatic hypertrophy)   . Coronary artery disease   . Diabetes mellitus without complication ([place])   . Dry eyes left  . Hiatal hernia   . Hyperlipidemia   . Hypertension   . Incomplete bladder emptying   . Nocturia   . 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 SOB (shortness of breath) on exertion    Past Surgical History:  Procedure Laterality Date  . CARDIAC CATHETERIZATION    . CORONARY STENT PLACEMENT  02/27/2014   distal rt/pd coronary       dr Einar Gip  . CYSTO/ BLADDER BIOPSY'S/ CAUTHERIZATION  01-14-2004  DR Gaynelle Arabian  . LEFT HEART CATHETERIZATION WITH CORONARY ANGIOGRAM N/A 02/27/2014   Procedure: LEFT HEART CATHETERIZATION WITH CORONARY ANGIOGRAM;  Surgeon: Laverda Page, MD;  Location: [facility] CATH LAB;  Service: Cardiovascular;  Laterality: N/A;  . PERCUTANEOUS CORONARY STENT INTERVENTION (PCI-S)  02/27/2014   Procedure: PERCUTANEOUS CORONARY STENT INTERVENTION (PCI-S);  Surgeon: Laverda Page, MD;  Location[facility] C[facility] LAB;  Service: Cardiovascular;;  rt PDA  3.0/28mm Promus stent  . TRANSURETHRAL RESECTION OF  PROSTATE  04/04/2012   Procedure: TRANSURETHRAL RESECTION OF THE PROSTATE WITH GYRUS INSTRUMENTS;  Surgeon: Ailene Rud, MD;  Location: [facility];  Service: Urology;  Laterality: N/A;  . TRANSURETHRAL RESECTION OF PROSTATE N/A 09/27/2014   Procedure: TRANSURETHRAL RESECTION OF THE PROSTATE ;  Surgeon: Carolan Clines, MD;  Location: [facility] ORS;  Service: Urology;  Laterality: N/A;  . UPPER GASTROINTESTINAL ENDOSCOPY     Social History   Socioeconomic History  . Marital status: Married    Spouse name: Not on file  . Number of children: 2  . Years of education: Not on file  . Highest education level: Not on file  Occupational History  . Not on file  Tobacco Use  . Smoking status: Former Smoker    Packs/day: 0.50    Years: 20.00    Pack years: 10.00    Types: Cigarettes    Quit date: 03/28/1990    Years since quitting: 29.0  . Smokeless tobacco: Never Used  Substance and Sexual Activity  . Alcohol use: Yes    Alcohol/week: 0.0 standard drinks    Comment: 1 beer week rarely  . Drug use: No  . Sexual activity: Not Currently  Other Topics Concern  . Not on file  Social History Narrative  . Not on file   Social Determinants of Health   Financial Resource Strain:   . Difficulty of Paying Living Expenses: Not on file  Food  Insecurity:   . Worried About Charity fundraiser in the Last Year: Not on file  . Ran Out of Food in the Last Year: Not on file  Transportation Needs:   . Lack of Transportation (Medical): Not on file  . Lack of Transportation (Non-Medical): Not on file  Physical Activity:   . Days of Exercise per Week: Not on file  . Minutes of Exercise per Session: Not on file  Stress:   . Feeling of Stress : Not on file  Social Connections:   . Frequency of Communication with Friends and Family: Not on file  . Frequency of Social Gatherings with Friends and Family: Not on file  . Attends Religious Services: Not on file  . Active Member of Clubs  or Organizations: Not on file  . Attends Archivist Meetings: Not on file  . Marital Status: Not on file  Intimate Partner Violence:   . Fear of Current or Ex-Partner: Not on file  . Emotionally Abused: Not on file  . Physically Abused: Not on file  . Sexually Abused: Not on file   ROS  Review of Systems  Constitution: Positive for malaise/fatigue. Negative for chills, decreased appetite and weight gain.  Cardiovascular: Positive for dyspnea on exertion (chronic) and leg swelling. Negative for chest pain and syncope.  Endocrine: Negative for cold intolerance.  Hematologic/Lymphatic: Does not bruise/bleed easily.  Musculoskeletal: Negative for joint swelling.  Gastrointestinal: Negative for abdominal pain, anorexia, change in bowel habit, hematochezia and melena.  Neurological: Positive for paresthesias (fingers). Negative for headaches and light-headedness.  Psychiatric/Behavioral: Negative for depression and substance abuse.  All other systems reviewed and are negative.  Objective  Blood pressure (!) 177/96, pulse 97, temperature (!) 96.6 F (35.9 C), height 5\' 9"  (1.753 m), weight 201 lb (91.2 kg), SpO2 97 %.  Vitals with BMI 04/18/2019 04/18/2019 12/05/2018  Height - 5\' 9"  5\' 9"   Weight - 201 lbs 219 lbs  BMI - 50.35 46.56  Systolic 812 751 700  Diastolic 96 86 92  Pulse 97 99 97     Physical Exam  Constitutional:  He is moderately built and mildly obese in no acute distress.  HENT:  Head: Atraumatic.  Eyes: Conjunctivae are normal.  Neck: No JVD present. No thyromegaly present.  Cardiovascular: Normal rate, regular rhythm and normal heart sounds. Exam reveals no gallop.  No murmur heard. Pulses:      Carotid pulses are 2+ on the right side and 2+ on the left side.      Femoral pulses are 2+ on the right side and 2+ on the left side.      Popliteal pulses are 1+ on the right side and 2+ on the left side.       Dorsalis pedis pulses are 1+ on the right side and  2+ on the left side.       Posterior tibial pulses are 0 on the right side and 0 on the left side.  2+  leg edema, no JVD.   Pulmonary/Chest: Effort normal and breath sounds normal.  Abdominal: Soft. Bowel sounds are normal.  Musculoskeletal:        General: Normal range of motion.     Cervical back: Neck supple.  Neurological: He is alert.  Skin: Skin is warm and dry.  Psychiatric: He has a normal mood and affect.   Laboratory examination:   Recent Labs    11/23/18 1120  NA 141  K 3.9  CL 103  CO2 27  GLUCOSE 114*  BUN 14  CREATININE 1.17  CALCIUM 9.7   CrCl cannot be calculated (Patient's most recent lab result is older than the maximum 21 days allowed.).  CMP Latest Ref Rng & Units 11/23/2018 10/19/2017 03/29/2017  Glucose 70 - 99 mg/dL 114(H) 172(H) 83  BUN 6 - 23 mg/dL 14 17 13   Creatinine 0.40 - 1.50 mg/dL 1.17 1.19 1.19  Sodium 135 - 145 mEq/L 141 138 141  Potassium 3.5 - 5.1 mEq/L 3.9 3.7 4.2  Chloride 96 - 112 mEq/L 103 101 103  CO2 19 - 32 mEq/L 27 26 30   Calcium 8.4 - 10.5 mg/dL 9.7 9.6 10.3  Total Protein 6.0 - 8.3 g/dL 6.7 6.7 7.1  Total Bilirubin 0.2 - 1.2 mg/dL 0.4 0.3 0.3  Alkaline Phos 39 - 117 U/L 80 78 74  AST 0 - 37 U/L 38(H) 22 22  ALT 0 - 53 U/L 55(H) 34 32   CBC Latest Ref Rng & Units 12/29/2018 11/23/2018 10/19/2017  WBC 4.0 - 10.5 K/uL 8.7 7.6 7.7  Hemoglobin 13.0 - 17.0 g/dL 14.7 14.3 14.1  Hematocrit 39.0 - 52.0 % 44.2 44.0 42.1  Platelets 150.0 - 400.0 K/uL 213.0 189.0 213.0   Lipid Panel     Component Value Date/Time   CHOL 111 11/23/2018 1120   TRIG 203.0 (H) 11/23/2018 1120   HDL 49.20 11/23/2018 1120   CHOLHDL 2 11/23/2018 1120   VLDL 40.6 (H) 11/23/2018 1120   LDLCALC 37 08/27/2016 1343   LDLDIRECT 40.0 11/23/2018 1120   HEMOGLOBIN A1C Lab Results  Component Value Date   HGBA1C 7.2 (H) 11/23/2018   TSH No results for input(s): TSH in the last 8760 hours.  Medications and allergies  No Known Allergies   Current Outpatient  Medications  Medication Instructions  . aspirin EC 81 mg, Oral, Daily  . atorvastatin (LIPITOR) 20 mg, Oral, Every evening  . cloNIDine (CATAPRES) 0.2 mg, Oral, Daily  . cromolyn (OPTICROM) 4 % ophthalmic solution 1 DROP IN BOTH EYES FOUR TIMES A DAY AS NEEDED  . furosemide (LASIX) 20 mg, Oral, Daily  . metFORMIN (GLUCOPHAGE) 1000 MG tablet TAKE 1 TABLET TWICE A DAY WITH A MEAL  . metoprolol succinate (TOPROL-XL) 100 mg, Oral, Daily, Take with or immediately following a meal.  . nitroGLYCERIN (NITROSTAT) 0.4 mg, Every 5 min PRN  . olmesartan-hydrochlorothiazide (BENICAR HCT) 40-25 MG tablet TAKE 1 TABLET BY MOUTH EVERY DAY  . potassium chloride SA (KLOR-CON) 20 MEQ tablet 20 mEq, Oral, Daily  . PREVIDENT 5000 BOOSTER PLUS 1.1 % PSTE APPLY A THIN RIBBON TO TOOTHBRUSH AND BRUSH TEETH FOR 2 MINUTES, USE DAILY  . tamsulosin (FLOMAX) 0.4 mg, Oral, Daily after breakfast  . verapamil (VERELAN PM) 240 MG 24 hr capsule TAKE 1 CAPSULE BY MOUTH EVERY DAY    Radiology:  No results found.  Cardiac Studies:   Coronary angiogram 02/27/2014: Stenting PDA with 3.0 x 28 mm Promus Premier DES. Left coronaries mild disease. LVEF 60%.  Lexiscan myoview stress test 04/13/2016: 1. The resting electrocardiogram demonstrated normal sinus rhythm, normal resting conduction, no resting arrhythmias and normal rest repolarization. Stress EKG is non-diagnostic for ischemia as it a pharmacologic stress using Lexiscan. Stress symptoms included dyspnea and chest discomfort. Hypertensive at rest and stress with peak BP 192/110 mm Hg. 2. Perfusion imaging study demonstrates soft tissue attenuation artifact in the inferior wall. There is no demonstrable ischemia or scar. LV systolic function was normal at 50% but visually appears  to be more than 50%. This is a low risk study. Compared to the study 10/24/2012, small sized inferior ischemia is no longer present. This represents a low risk study.  Echocardiogram 09/17/2017: Left  ventricle cavity is normal in size. Mild concentric hypertrophy of the left ventricle. Normal global wall motion. Doppler evidence of grade I (impaired) diastolic dysfunction. Visual EF is 50-55%. Calculated EF 54%. No aortic valve regurgitation noted. Mild aortic valve leaflet thickening. Mild tricuspid regurgitation. No evidence of pulmonary hypertension. Compared to 06/26/2014, no significant change.   Assessment     ICD-10-CM   1. Atherosclerosis of native coronary artery of native heart without angina pectoris  I25.10 EKG 12-Lead  2. Hypertension with heart disease  I11.9 furosemide (LASIX) 20 MG tablet  3. Hypercholesterolemia  E78.00   4. Bilateral leg edema  R60.0 furosemide (LASIX) 20 MG tablet    potassium chloride SA (KLOR-CON) 20 MEQ tablet  5. Diabetic polyneuropathy associated with type 2 diabetes mellitus (HCC)  E11.42 TSH    Hgb A1c w/o eAG    EKG 04/18/2019: Normal sinus rhythm at rate of 93 bpm, normal axis.  No evidence of ischemia, normal EKG.    Recommendations:   Meds ordered this encounter  Medications  . furosemide (LASIX) 20 MG tablet    Sig: Take 1 tablet (20 mg total) by mouth daily.    Dispense:  30 tablet    Refill:  2  . potassium chloride SA (KLOR-CON) 20 MEQ tablet    Sig: Take 1 tablet (20 mEq total) by mouth daily.    Dispense:  30 tablet    Refill:  2    SAHEJ SCHRIEBER  is a 73 y.o. AA male  who presents for  follow-up for CAD, hypertension, hyperlipidemia.  He has history of stenting to the PDA on 02/27/2014.  Patient has very poor dietary habits, I have extensively discussed regarding making dietary changes and avoidance of salt. For leg edema I have started him on furosemide 20 mg along with potassium 20 mEq daily, would like to see him back in 6 weeks for follow-up. Leg edema is probably related to sedentary lifestyle and dependent edema, do not suspect congestive heart failure.  Lasix may also help with his hypertension which is  uncontrolled.  He needs diabetes control, although patient has diagnosis of diabetes he appears to say that he has only prediabetes.  I given him a low glycemic diet sheet.  Will obtain A1c as well with his labs that were previously scheduled to get his lipid profile testing.  With regard to coronary artery disease, will discontinue Plavix as he has completed the course and continue aspirin alone.  Adrian Prows, MD, [facility] 04/18/2019, 11:37 AM [facility] Pager: [phone] Office: [phone]

## 2019-04-20 LAB — HGB A1C W/O EAG: Hgb A1c MFr Bld: 7.3 % — ABNORMAL HIGH (ref 4.8–5.6)

## 2019-04-20 LAB — TSH: TSH: 2.59 u[IU]/mL (ref 0.450–4.500)

## 2019-05-10 ENCOUNTER — Encounter: Payer: Self-pay | Admitting: Podiatry

## 2019-05-10 ENCOUNTER — Ambulatory Visit (INDEPENDENT_AMBULATORY_CARE_PROVIDER_SITE_OTHER): Payer: Medicare Other | Admitting: Podiatry

## 2019-05-10 ENCOUNTER — Other Ambulatory Visit: Payer: Self-pay

## 2019-05-10 DIAGNOSIS — B351 Tinea unguium: Secondary | ICD-10-CM | POA: Diagnosis not present

## 2019-05-10 DIAGNOSIS — M79674 Pain in right toe(s): Secondary | ICD-10-CM

## 2019-05-10 DIAGNOSIS — M79675 Pain in left toe(s): Secondary | ICD-10-CM | POA: Diagnosis not present

## 2019-05-10 NOTE — Patient Instructions (Signed)
Diabetes Mellitus and Foot Care Foot care is an important part of your health, especially when you have diabetes. Diabetes may cause you to have problems because of poor blood flow (circulation) to your feet and legs, which can cause your skin to:  Become thinner and drier.  Break more easily.  Heal more slowly.  Peel and crack. You may also have nerve damage (neuropathy) in your legs and feet, causing decreased feeling in them. This means that you may not notice minor injuries to your feet that could lead to more serious problems. Noticing and addressing any potential problems early is the best way to prevent future foot problems. How to care for your feet Foot hygiene  Wash your feet daily with warm water and mild soap. Do not use hot water. Then, pat your feet and the areas between your toes until they are completely dry. Do not soak your feet as this can dry your skin.  Trim your toenails straight across. Do not dig under them or around the cuticle. File the edges of your nails with an emery board or nail file.  Apply a moisturizing lotion or petroleum jelly to the skin on your feet and to dry, brittle toenails. Use lotion that does not contain alcohol and is unscented. Do not apply lotion between your toes. Shoes and socks  Wear clean socks or stockings every day. Make sure they are not too tight. Do not wear knee-high stockings since they may decrease blood flow to your legs.  Wear shoes that fit properly and have enough cushioning. Always look in your shoes before you put them on to be sure there are no objects inside.  To break in new shoes, wear them for just a few hours a day. This prevents injuries on your feet. Wounds, scrapes, corns, and calluses  Check your feet daily for blisters, cuts, bruises, sores, and redness. If you cannot see the bottom of your feet, use a mirror or ask someone for help.  Do not cut corns or calluses or try to remove them with medicine.  If you  find a minor scrape, cut, or break in the skin on your feet, keep it and the skin around it clean and dry. You may clean these areas with mild soap and water. Do not clean the area with peroxide, alcohol, or iodine.  If you have a wound, scrape, corn, or callus on your foot, look at it several times a day to make sure it is healing and not infected. Check for: ? Redness, swelling, or pain. ? Fluid or blood. ? Warmth. ? Pus or a bad smell. General instructions  Do not cross your legs. This may decrease blood flow to your feet.  Do not use heating pads or hot water bottles on your feet. They may burn your skin. If you have lost feeling in your feet or legs, you may not know this is happening until it is too late.  Protect your feet from hot and cold by wearing shoes, such as at the beach or on hot pavement.  Schedule a complete foot exam at least once a year (annually) or more often if you have foot problems. If you have foot problems, report any cuts, sores, or bruises to your health care provider immediately. Contact a health care provider if:  You have a medical condition that increases your risk of infection and you have any cuts, sores, or bruises on your feet.  You have an injury that is not   healing.  You have redness on your legs or feet.  You feel burning or tingling in your legs or feet.  You have pain or cramps in your legs and feet.  Your legs or feet are numb.  Your feet always feel cold.  You have pain around a toenail. Get help right away if:  You have a wound, scrape, corn, or callus on your foot and: ? You have pain, swelling, or redness that gets worse. ? You have fluid or blood coming from the wound, scrape, corn, or callus. ? Your wound, scrape, corn, or callus feels warm to the touch. ? You have pus or a bad smell coming from the wound, scrape, corn, or callus. ? You have a fever. ? You have a red line going up your leg. Summary  Check your feet every day  for cuts, sores, red spots, swelling, and blisters.  Moisturize feet and legs daily.  Wear shoes that fit properly and have enough cushioning.  If you have foot problems, report any cuts, sores, or bruises to your health care provider immediately.  Schedule a complete foot exam at least once a year (annually) or more often if you have foot problems. This information is not intended to replace advice given to you by your health care provider. Make sure you discuss any questions you have with your health care provider. Document Revised: 01/04/2019 Document Reviewed: 05/15/2016 Elsevier Patient Education  2020 [employer]vier Inc.  

## 2019-05-15 NOTE — Progress Notes (Signed)
Subjective: Christian [name] is a 74 y.o. y.o. male who is on long term blood thinner, Plavix,  presents today with painful, discolored, thick toenails  which interfere with daily activities. Pain is aggravated when wearing enclosed shoe gear. Pain is relieved with periodic professional debridement.  Medications reviewed in chart.  No Known Allergies   Objective: There were no vitals filed for this visit.  Vascular Examination: Capillary refill time immediate x 10 digits.  Dorsalis pedis present b/l.  Posterior tibial pulses present b/l.  Digital hair  present x 10 digits.  Skin temperature gradient WNL b/l.  Dermatological Examination:  Skin with normal turgor, texture and tone b/l.  Anonychia left great toe(s) with evidence of permanent total nail avulsion. Nailbed(s) completely epithelialized and intact.  Toenails 1-5 right, 2-5 left discolored, thick, dystrophic with subungual debris and pain with palpation to nailbeds due to thickness of nails.  No open wounds.  No interdigital macerations.  Musculoskeletal: Muscle strength 5/5 to all LE muscle groups.  No gross bony deformity b/l.  No pain, crepitus or joint discomfort with active/passive ROM.  Neurological: Sensation intact 5/5 b/l with 10 gram monofilament.  Vibratory sensation intact b/l.  Assessment: Painful onychomycosis toenails 1-5 right, 2-5 left in patient on blood thinner.   Plan: 1. Toenails 1-5 right, 2-5 left were debrided in length and girth without iatrogenic bleeding. 2. Patient to continue soft, supportive shoe gear daily. 3. Patient to report any pedal injuries to medical professional immediately. 4. Avoid self trimming due to use of blood thinner. 5. Follow up 3 months.  6. Patient/POA to call should there be a concern in the interim.

## 2019-06-01 ENCOUNTER — Encounter: Payer: Self-pay | Admitting: Cardiology

## 2019-06-01 ENCOUNTER — Ambulatory Visit (INDEPENDENT_AMBULATORY_CARE_PROVIDER_SITE_OTHER): Payer: Medicare Other | Admitting: Cardiology

## 2019-06-01 ENCOUNTER — Other Ambulatory Visit: Payer: Self-pay

## 2019-06-01 VITALS — BP 146/89 | HR 97 | Temp 97.0°F | Ht 69.0 in | Wt 218.5 lb

## 2019-06-01 DIAGNOSIS — R945 Abnormal results of liver function studies: Secondary | ICD-10-CM | POA: Diagnosis not present

## 2019-06-01 DIAGNOSIS — K219 Gastro-esophageal reflux disease without esophagitis: Secondary | ICD-10-CM

## 2019-06-01 DIAGNOSIS — I25118 Atherosclerotic heart disease of native coronary artery with other forms of angina pectoris: Secondary | ICD-10-CM | POA: Diagnosis not present

## 2019-06-01 DIAGNOSIS — E7800 Pure hypercholesterolemia, unspecified: Secondary | ICD-10-CM | POA: Diagnosis not present

## 2019-06-01 DIAGNOSIS — I119 Hypertensive heart disease without heart failure: Secondary | ICD-10-CM | POA: Diagnosis not present

## 2019-06-01 DIAGNOSIS — I2510 Atherosclerotic heart disease of native coronary artery without angina pectoris: Secondary | ICD-10-CM

## 2019-06-01 DIAGNOSIS — R7989 Other specified abnormal findings of blood chemistry: Secondary | ICD-10-CM

## 2019-06-01 DIAGNOSIS — E1142 Type 2 diabetes mellitus with diabetic polyneuropathy: Secondary | ICD-10-CM

## 2019-06-01 MED ORDER — SPIRONOLACTONE 25 MG PO TABS: 25.0000 mg | ORAL_TABLET | Freq: Every day | ORAL | 2 refills | Status: DC

## 2019-06-01 MED ORDER — NITROGLYCERIN 0.4 MG SL SUBL: 0.4000 mg | SUBLINGUAL_TABLET | SUBLINGUAL | 1 refills | Status: DC | PRN

## 2019-06-01 NOTE — Progress Notes (Signed)
Primary Physician/Referring:  Hoyt Koch, MD  Patient ID: Christian [name], male    DOB: Apr 02, 1946, 74 y.o.   MRN: [id number]  Chief Complaint  Patient presents with  . Hypertension  . Leg Swelling  . Coronary Artery Disease  . Follow-up    Having chest pain   HPI:    Christian [name]  is a 74 y.o. AA male  who presents for  follow-up for CAD, hypertension, hyperlipidemia.  He has history of stenting to the PDA on 02/27/2014. C/O marked fatigue especially since blood pressure medication changes and also continued chronic dyspnea. No PND or orthopnea His noticed leg edema is slightly getting worse.    Patient was seen by me 2 months ago, I had added furosemide for hypertension control and also had extensive discussion regarding diabetes mellitus.  He now presents for follow-up.   States he has made lifestyle changes and trying his best to loose weight. No chest pain or worsening dyspnea.   Past Medical History:  Diagnosis Date  . BPH (benign prostatic hypertrophy)   . Coronary artery disease   . Diabetes mellitus without complication ([place])   . Dry eyes left  . Hiatal hernia   . Hyperlipidemia   . Hypertension   . Incomplete bladder emptying   . Nocturia   . 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 SOB (shortness of breath) on exertion    Past Surgical History:  Procedure Laterality Date  . CARDIAC CATHETERIZATION    . CORONARY STENT PLACEMENT  02/27/2014   distal rt/pd coronary       dr Einar Gip  . CYSTO/ BLADDER BIOPSY'S/ CAUTHERIZATION  01-14-2004  DR Gaynelle Arabian  . LEFT HEART CATHETERIZATION WITH CORONARY ANGIOGRAM N/A 02/27/2014   Procedure: LEFT HEART CATHETERIZATION WITH CORONARY ANGIOGRAM;  Surgeon: Laverda Page, MD;  Location: [facility] CATH LAB;  Service: Cardiovascular;  Laterality: N/A;  . PERCUTANEOUS CORONARY STENT INTERVENTION (PCI-S)  02/27/2014    Procedure: PERCUTANEOUS CORONARY STENT INTERVENTION (PCI-S);  Surgeon: Laverda Page, MD;  Location: [facility] CATH LAB;  Service: Cardiovascular;;  rt PDA  3.0/28mm Promus stent  . TRANSURETHRAL RESECTION OF PROSTATE  04/04/2012   Procedure: TRANSURETHRAL RESECTION OF THE PROSTATE WITH GYRUS INSTRUMENTS;  Surgeon: Ailene Rud, MD;  Location: [facility];  Service: Urology;  Laterality: N/A;  . TRANSURETHRAL RESECTION OF PROSTATE N/A 09/27/2014   Procedure: TRANSURETHRAL RESECTION OF THE PROSTATE ;  Surgeon: Carolan Clines, MD;  Location: [facility];  Service: Urology;  Laterality: N/A;  . UPPER GASTROINTESTINAL ENDOSCOPY     Social History   Tobacco Use  . Smoking status: Former Smoker    Packs/day: 0.50    Years: 20.00    Pack years: 10.00    Types: Cigarettes    Quit date: 03/28/1990    Years since quitting: 29.2  . Smokeless tobacco: Never Used  Substance Use Topics  . Alcohol use: Yes    Alcohol/week: 0.0 standard drinks    Comment: 1 beer week rarely   ROS  Review of Systems  Constitution: Positive for malaise/fatigue. Negative for chills, decreased appetite and weight gain.  Cardiovascular: Positive for dyspnea on exertion (chronic) and leg swelling. Negative for chest pain and syncope.  Endocrine: Negative for cold intolerance.  Hematologic/Lymphatic: Does not bruise/bleed easily.  Musculoskeletal: Negative for joint swelling.  Gastrointestinal: Negative for abdominal pain, anorexia, change  in bowel habit, hematochezia and melena.  Neurological: Positive for paresthesias (fingers). Negative for headaches and light-headedness.  Psychiatric/Behavioral: Negative for depression and substance abuse.  All other systems reviewed and are negative.  Objective  Blood pressure (!) 146/89, pulse 97, temperature (!) 97 F (36.1 C), height 5' 9"  (1.753 m), weight 218 lb 8 oz (99.1 kg), SpO2 95 %.  Vitals with BMI 06/01/2019 04/18/2019 04/18/2019  Height 5' 9"  - 5' 9"    Weight 218 lbs 8 oz - 201 lbs  BMI 47.42 - 59.56  Systolic 387 564 332  Diastolic 89 96 86  Pulse 97 97 99     Physical Exam  Constitutional:  He is moderately built and mildly obese in no acute distress.  Eyes: Conjunctivae are normal.  Neck: No thyromegaly present.  Cardiovascular: Normal rate, regular rhythm and normal heart sounds. Exam reveals no gallop.  No murmur heard. Pulses:      Carotid pulses are 2+ on the right side and 2+ on the left side.      Femoral pulses are 2+ on the right side and 2+ on the left side.      Popliteal pulses are 1+ on the right side and 2+ on the left side.       Dorsalis pedis pulses are 1+ on the right side and 2+ on the left side.       Posterior tibial pulses are 0 on the right side and 0 on the left side.  2+  leg edema, no JVD.   Pulmonary/Chest: Effort normal and breath sounds normal.  Abdominal: Soft. Bowel sounds are normal.   Laboratory examination:   Recent Labs    11/23/18 1120  NA 141  K 3.9  CL 103  CO2 27  GLUCOSE 114*  BUN 14  CREATININE 1.17  CALCIUM 9.7   CrCl cannot be calculated (Patient's most recent lab result is older than the maximum 21 days allowed.).  CMP Latest Ref Rng & Units 11/23/2018 10/19/2017 03/29/2017  Glucose 70 - 99 mg/dL 114(H) 172(H) 83  BUN 6 - 23 mg/dL 14 17 13   Creatinine 0.40 - 1.50 mg/dL 1.17 1.19 1.19  Sodium 135 - 145 mEq/L 141 138 141  Potassium 3.5 - 5.1 mEq/L 3.9 3.7 4.2  Chloride 96 - 112 mEq/L 103 101 103  CO2 19 - 32 mEq/L 27 26 30   Calcium 8.4 - 10.5 mg/dL 9.7 9.6 10.3  Total Protein 6.0 - 8.3 g/dL 6.7 6.7 7.1  Total Bilirubin 0.2 - 1.2 mg/dL 0.4 0.3 0.3  Alkaline Phos 39 - 117 U/L 80 78 74  AST 0 - 37 U/L 38(H) 22 22  ALT 0 - 53 U/L 55(H) 34 32   CBC Latest Ref Rng & Units 12/29/2018 11/23/2018 10/19/2017  WBC 4.0 - 10.5 K/uL 8.7 7.6 7.7  Hemoglobin 13.0 - 17.0 g/dL 14.7 14.3 14.1  Hematocrit 39.0 - 52.0 % 44.2 44.0 42.1  Platelets 150.0 - 400.0 K/uL 213.0 189.0 213.0   Lipid  Panel     Component Value Date/Time   CHOL 111 11/23/2018 1120   TRIG 203.0 (H) 11/23/2018 1120   HDL 49.20 11/23/2018 1120   CHOLHDL 2 11/23/2018 1120   VLDL 40.6 (H) 11/23/2018 1120   LDLCALC 37 08/27/2016 1343   LDLDIRECT 40.0 11/23/2018 1120   HEMOGLOBIN A1C Lab Results  Component Value Date   HGBA1C 7.3 (H) 04/19/2019   TSH Recent Labs    04/19/19 0911  TSH 2.590   Medications and  allergies  No Known Allergies   Current Outpatient Medications  Medication Instructions  . aspirin EC 81 mg, Oral, Daily  . atorvastatin (LIPITOR) 20 mg, Oral, Every evening  . cloNIDine (CATAPRES) 0.2 mg, Oral, Daily  . cromolyn (OPTICROM) 4 % ophthalmic solution 1 DROP IN BOTH EYES FOUR TIMES A DAY AS NEEDED  . furosemide (LASIX) 20 mg, Oral, Daily  . isosorbide mononitrate (IMDUR) 60 mg, Oral, Daily  . metFORMIN (GLUCOPHAGE) 1000 MG tablet TAKE 1 TABLET TWICE A DAY WITH A MEAL  . metoprolol succinate (TOPROL-XL) 100 mg, Oral, Daily, Take with or immediately following a meal.  . nitroGLYCERIN (NITROSTAT) 0.4 mg, Sublingual, Every 5 min PRN  . olmesartan-hydrochlorothiazide (BENICAR HCT) 40-25 MG tablet TAKE 1 TABLET BY MOUTH EVERY DAY  . PREVIDENT 5000 BOOSTER PLUS 1.1 % PSTE APPLY A THIN RIBBON TO TOOTHBRUSH AND BRUSH TEETH FOR 2 MINUTES, USE DAILY  . spironolactone (ALDACTONE) 25 mg, Oral, Daily  . tamsulosin (FLOMAX) 0.4 mg, Oral, Daily after breakfast  . verapamil (VERELAN PM) 240 MG 24 hr capsule TAKE 1 CAPSULE BY MOUTH EVERY DAY   Radiology:  No results found.  Cardiac Studies:   Coronary angiogram 02/27/2014: Stenting PDA with 3.0 x 28 mm Promus Premier DES. Left coronaries mild disease. LVEF 60%.  Lexiscan myoview stress test 04/13/2016: 1. The resting electrocardiogram demonstrated normal sinus rhythm, normal resting conduction, no resting arrhythmias and normal rest repolarization. Stress EKG is non-diagnostic for ischemia as it a pharmacologic stress using Lexiscan. Stress  symptoms included dyspnea and chest discomfort. Hypertensive at rest and stress with peak BP 192/110 mm Hg. 2. Perfusion imaging study demonstrates soft tissue attenuation artifact in the inferior wall. There is no demonstrable ischemia or scar. LV systolic function was normal at 50% but visually appears to be more than 50%. This is a low risk study. Compared to the study 10/24/2012, small sized inferior ischemia is no longer present. This represents a low risk study.  Echocardiogram 09/17/2017: Left ventricle cavity is normal in size. Mild concentric hypertrophy of the left ventricle. Normal global wall motion. Doppler evidence of grade I (impaired) diastolic dysfunction. Visual EF is 50-55%. Calculated EF 54%. No aortic valve regurgitation noted. Mild aortic valve leaflet thickening. Mild tricuspid regurgitation. No evidence of pulmonary hypertension. Compared to 06/26/2014, no significant change.  Assessment     ICD-10-CM   1. Hypertension with heart disease  I11.9 spironolactone (ALDACTONE) 25 MG tablet    CMP14+EGFR    CMP14+EGFR  2. Hypercholesterolemia  E78.00   3. Atherosclerosis of native coronary artery of native heart without angina pectoris  I25.10 EKG 12-Lead  4. Diabetic polyneuropathy associated with type 2 diabetes mellitus (HCC)  E11.42   5. Gastroesophageal reflux disease without esophagitis  K21.9   6. Coronary artery disease of native artery of native heart with stable angina pectoris (HCC)  I25.118 nitroGLYCERIN (NITROSTAT) 0.4 MG SL tablet  7. Abnormal LFTs  R94.5     EKG 06/01/2019: Normal sinus rhythm at the rate of 98 bpm, leftward enlargement, normal axis.  No evidence of ischemia.   No significant change from  EKG 04/18/2019.    Recommendations:   Meds ordered this encounter  Medications  . spironolactone (ALDACTONE) 25 MG tablet    Sig: Take 1 tablet (25 mg total) by mouth daily.    Dispense:  30 tablet    Refill:  2  . nitroGLYCERIN (NITROSTAT) 0.4 MG SL  tablet    Sig: Place 1 tablet (0.4 mg total)  under the tongue every 5 (five) minutes as needed for chest pain.    Dispense:  25 tablet    Refill:  1    Christian [name]  is a 74 y.o. AA male  who presents for  follow-up for CAD, hypertension, hyperlipidemia.  He has history of stenting to the PDA on 02/27/2014.  Patient was seen by me 2 months ago, I had added furosemide for hypertension control and also had extensive discussion regarding diabetes mellitus.  He now presents for follow-up.  I advised him that his fatigue could be contributed by decreased physical activity and deconditioning but also contributed by uncontrolled diabetes mellitus.  I have urged him to follow-up with Dr. Pricilla Holm and discussed regarding diabetes management. He has lost about 20 Lbs since last OV.  Encouraged him to continue to be active. Refilled his S/L NTG.   BP is still not at gaol, added aldactone and will obtain CMP to f/u abnormal LFT and also S. Cr. He is having difficulty in swallowing since starting KCL tablets, related to gerd, discontinue this as I am adding aldactone.   Lipids are well controlled except for elevated triglycerides again related to uncontrolled diabetes mellitus.  Blood pressure is now improved.  Leg edema is also improved with furosemide.  Continue the same.  I will see him back in a year.   Adrian Prows, MD, [facility] 06/03/2019, 9:08 PM [facility] [place] Office: [phone]

## 2019-06-18 ENCOUNTER — Other Ambulatory Visit: Payer: Self-pay | Admitting: Cardiology

## 2019-06-18 DIAGNOSIS — I119 Hypertensive heart disease without heart failure: Secondary | ICD-10-CM

## 2019-06-20 LAB — CMP14+EGFR
ALT: 65 IU/L — ABNORMAL HIGH (ref 0–44)
AST: 39 IU/L (ref 0–40)
Albumin/Globulin Ratio: 1.8 (ref 1.2–2.2)
Albumin: 4.2 g/dL (ref 3.7–4.7)
Alkaline Phosphatase: 108 IU/L (ref 39–117)
BUN/Creatinine Ratio: 15 (ref 10–24)
BUN: 21 mg/dL (ref 8–27)
Bilirubin Total: 0.3 mg/dL (ref 0.0–1.2)
CO2: 23 mmol/L (ref 20–29)
Calcium: 10.6 mg/dL — ABNORMAL HIGH (ref 8.6–10.2)
Chloride: 100 mmol/L (ref 96–106)
Creatinine, Ser: 1.36 mg/dL — ABNORMAL HIGH (ref 0.76–1.27)
GFR calc Af Amer: 60 mL/min/{1.73_m2} (ref >59)
GFR calc non Af Amer: 52 mL/min/{1.73_m2} — ABNORMAL LOW (ref >59)
Globulin, Total: 2.3 g/dL (ref 1.5–4.5)
Glucose: 151 mg/dL — ABNORMAL HIGH (ref 65–99)
Potassium: 4.2 mmol/L (ref 3.5–5.2)
Sodium: 141 mmol/L (ref 134–144)
Total Protein: 6.5 g/dL (ref 6.0–8.5)

## 2019-06-20 NOTE — Addendum Note (Signed)
Addended by: Kela Millin on: 06/20/2019 07:18 AM   Modules accepted: Orders

## 2019-06-20 NOTE — Progress Notes (Signed)
Looks like with addition of aldactone, S. Cr has jumped up significantly, I will see him back for HTN management, he needs DM control.

## 2019-06-20 NOTE — Progress Notes (Signed)
Advise patient to stop Aldactone and to come and see me for hypertension management. I will recheck BMP in 2 weeks. JG

## 2019-06-22 ENCOUNTER — Telehealth: Payer: Self-pay

## 2019-06-22 MED ORDER — ATORVASTATIN CALCIUM 20 MG PO TABS: ORAL_TABLET | ORAL | 3 refills | Status: DC

## 2019-06-22 NOTE — Telephone Encounter (Signed)
-----   Message from Adrian Prows, MD sent at 06/20/2019  7:18 AM EST ----- Advise patient to stop Aldactone and to come and see me for hypertension management. I will recheck BMP in 2 weeks. JG

## 2019-06-22 NOTE — Progress Notes (Signed)
How soon do you want to see the patient back in the office?

## 2019-06-23 NOTE — Progress Notes (Signed)
Patient transferred to the front to schedule a follow up appt.

## 2019-06-23 NOTE — Progress Notes (Signed)
2 weeks.  

## 2019-07-03 ENCOUNTER — Other Ambulatory Visit: Payer: Self-pay | Admitting: Cardiology

## 2019-07-04 ENCOUNTER — Encounter: Payer: Self-pay | Admitting: Internal Medicine

## 2019-07-04 ENCOUNTER — Ambulatory Visit (INDEPENDENT_AMBULATORY_CARE_PROVIDER_SITE_OTHER): Payer: Medicare Other | Admitting: Internal Medicine

## 2019-07-04 ENCOUNTER — Other Ambulatory Visit: Payer: Self-pay

## 2019-07-04 VITALS — BP 140/86 | HR 108 | Temp 98.3°F | Ht 69.0 in | Wt 219.0 lb

## 2019-07-04 DIAGNOSIS — R0789 Other chest pain: Secondary | ICD-10-CM

## 2019-07-04 LAB — COMPREHENSIVE METABOLIC PANEL
ALT: 62 U/L — ABNORMAL HIGH (ref 0–53)
AST: 39 U/L — ABNORMAL HIGH (ref 0–37)
Albumin: 4.3 g/dL (ref 3.5–5.2)
Alkaline Phosphatase: 96 U/L (ref 39–117)
BUN: 25 mg/dL — ABNORMAL HIGH (ref 6–23)
CO2: 30 mEq/L (ref 19–32)
Calcium: 9.9 mg/dL (ref 8.4–10.5)
Chloride: 101 mEq/L (ref 96–112)
Creatinine, Ser: 1.52 mg/dL — ABNORMAL HIGH (ref 0.40–1.50)
GFR: 54.76 mL/min — ABNORMAL LOW (ref >60.00)
Glucose, Bld: 154 mg/dL — ABNORMAL HIGH (ref 70–99)
Potassium: 3.9 mEq/L (ref 3.5–5.1)
Sodium: 139 mEq/L (ref 135–145)
Total Bilirubin: 0.4 mg/dL (ref 0.2–1.2)
Total Protein: 6.9 g/dL (ref 6.0–8.3)

## 2019-07-04 LAB — CBC
HCT: 39.6 % (ref 39.0–52.0)
Hemoglobin: 13.5 g/dL (ref 13.0–17.0)
MCHC: 34 g/dL (ref 30.0–36.0)
MCV: 101 fl — ABNORMAL HIGH (ref 78.0–100.0)
Platelets: 193 10*3/uL (ref 150.0–400.0)
RBC: 3.92 Mil/uL — ABNORMAL LOW (ref 4.22–5.81)
RDW: 14 % (ref 11.5–15.5)
WBC: 8.6 10*3/uL (ref 4.0–10.5)

## 2019-07-04 LAB — CK: Total CK: 56 U/L (ref 7–232)

## 2019-07-04 MED ORDER — SUCRALFATE 1 GM/10ML PO SUSP: 1.0000 g | Freq: Three times a day (TID) | ORAL | 0 refills | Status: DC

## 2019-07-04 NOTE — Assessment & Plan Note (Addendum)
Has been assessed by cardiology and they do not feel cardiac. Rx for carafate to see if this could be related to esophagus and GERD. Continue prilosec. If no improvement needs referral to GI for further assessment. Given the tenderness on exam checking CK to rule out alternate etiology.

## 2019-07-04 NOTE — Progress Notes (Signed)
   Subjective:   Patient ID: Christian [name], male    DOB: [birth year], 74 y.o.   MRN: [id number]  HPI The patient is a 74 YO man coming in for chest pain/GERD. Taking prilosec for last week or so. Has seen his cardiologist several times about this and they do not feel it is his heart. Happens randomly and lasts for 30 minutes to an hour. Feels like sharp pain mid-chest. Has not tried taking tums or maalox in the moment. He did start prilosec on his own about 1 week ago and is not sure if this has made any difference. Denies fevers or chills. Denies SOB change recently. Does have hiatal hernia in the past.   Review of Systems  Constitutional: Negative.   HENT: Negative.   Eyes: Negative.   Respiratory: Negative for cough, chest tightness and shortness of breath.   Cardiovascular: Positive for chest pain. Negative for palpitations and leg swelling.  Gastrointestinal: Negative for abdominal distention, abdominal pain, constipation, diarrhea, nausea and vomiting.  Musculoskeletal: Negative.   Skin: Negative.   Neurological: Negative.   Psychiatric/Behavioral: Negative.     Objective:  Physical Exam Constitutional:      Appearance: He is well-developed.  HENT:     Head: Normocephalic and atraumatic.  Cardiovascular:     Rate and Rhythm: Normal rate and regular rhythm.     Comments: Mild tenderness to palpation mid chest Pulmonary:     Effort: Pulmonary effort is normal. No respiratory distress.     Breath sounds: Normal breath sounds. No wheezing or rales.  Abdominal:     General: Bowel sounds are normal. There is no distension.     Palpations: Abdomen is soft.     Tenderness: There is no abdominal tenderness. There is no rebound.  Musculoskeletal:     Cervical back: Normal range of motion.  Skin:    General: Skin is warm and dry.  Neurological:     Mental Status: He is alert and oriented to person, place, and time.     Coordination: Coordination normal.     Vitals:   07/04/19 1048  BP: 140/86  Pulse: (!) 108  Temp: 98.3 F (36.8 C)  TempSrc: Oral  SpO2: 98%  Weight: 219 lb (99.3 kg)  Height: 5\' 9"  (1.753 m)    This visit occurred during the SARS-CoV-2 public health emergency.  Safety protocols were in place, including screening questions prior to the visit, additional usage of staff PPE, and extensive cleaning of exam room while observing appropriate contact time as indicated for disinfecting solutions.   Assessment & Plan:  Visit time 20 minutes in face to face communication with patient and coordination of care, additional 10 minutes spent in record review, coordination or care, ordering tests, communicating/referring to other healthcare professionals, documenting in medical records all on the same day of the visit for total time 30 minutes spent on the visit.

## 2019-07-04 NOTE — Patient Instructions (Signed)
We will check the labs today.   We have sent in a liquid medicine called carafate to drink 10 mL 3 times a day for 1 week.   Let us know if this helps.

## 2019-07-11 ENCOUNTER — Other Ambulatory Visit: Payer: Self-pay | Admitting: Cardiology

## 2019-07-11 DIAGNOSIS — M62838 Other muscle spasm: Secondary | ICD-10-CM | POA: Diagnosis not present

## 2019-07-11 DIAGNOSIS — R600 Localized edema: Secondary | ICD-10-CM

## 2019-07-11 DIAGNOSIS — M6281 Muscle weakness (generalized): Secondary | ICD-10-CM | POA: Diagnosis not present

## 2019-07-11 DIAGNOSIS — I119 Hypertensive heart disease without heart failure: Secondary | ICD-10-CM

## 2019-07-14 ENCOUNTER — Telehealth: Payer: Self-pay | Admitting: Internal Medicine

## 2019-07-14 DIAGNOSIS — K219 Gastro-esophageal reflux disease without esophagitis: Secondary | ICD-10-CM

## 2019-07-14 NOTE — Telephone Encounter (Signed)
New Message:   1.Medication Requested: sucralfate (CARAFATE) 1 GM/10ML suspension 2. Pharmacy (Name, Street, [place]): [id number] - [place], [place] 3. On Med List: yes  4. Last Visit with PCP: 07/04/19  5. Next visit date with PCP:  Pt states if you would like for him to come into the office for another exam he will but states this medication is semi working for him. Agent: Please be advised that RX refills may take up to 3 business days. We ask that you follow-up with your pharmacy.

## 2019-07-15 MED ORDER — SUCRALFATE 1 GM/10ML PO SUSP: 1.0000 g | Freq: Three times a day (TID) | ORAL | 0 refills | Status: DC

## 2019-07-15 NOTE — Telephone Encounter (Signed)
Pt contacted and stated that the sucralfate was not as helpful as he was hoping for.   Pt stated that his pain is a 20 out of 10.   Reviewed encounter notes from [place]. PCP recommended a GI referral given tenderness on exam, normal CK lab and the effectiveness of the sucralfate.   erx for sucralfate sent in case wanted to keep using. Referral entered per PCP note.

## 2019-07-24 ENCOUNTER — Other Ambulatory Visit: Payer: Self-pay

## 2019-07-24 ENCOUNTER — Other Ambulatory Visit: Payer: Self-pay | Admitting: Cardiology

## 2019-07-24 ENCOUNTER — Ambulatory Visit (INDEPENDENT_AMBULATORY_CARE_PROVIDER_SITE_OTHER): Payer: Medicare Other

## 2019-07-24 ENCOUNTER — Encounter: Payer: Self-pay | Admitting: Family

## 2019-07-24 ENCOUNTER — Ambulatory Visit (INDEPENDENT_AMBULATORY_CARE_PROVIDER_SITE_OTHER): Payer: Medicare Other | Admitting: Family

## 2019-07-24 VITALS — BP 156/84 | HR 98 | Temp 98.4°F | Ht 69.0 in | Wt 221.8 lb

## 2019-07-24 DIAGNOSIS — K224 Dyskinesia of esophagus: Secondary | ICD-10-CM | POA: Diagnosis not present

## 2019-07-24 DIAGNOSIS — R079 Chest pain, unspecified: Secondary | ICD-10-CM | POA: Diagnosis not present

## 2019-07-24 DIAGNOSIS — I119 Hypertensive heart disease without heart failure: Secondary | ICD-10-CM

## 2019-07-24 DIAGNOSIS — R0789 Other chest pain: Secondary | ICD-10-CM

## 2019-07-24 DIAGNOSIS — E119 Type 2 diabetes mellitus without complications: Secondary | ICD-10-CM | POA: Diagnosis not present

## 2019-07-24 DIAGNOSIS — I2510 Atherosclerotic heart disease of native coronary artery without angina pectoris: Secondary | ICD-10-CM

## 2019-07-24 LAB — COMPREHENSIVE METABOLIC PANEL
ALT: 78 U/L — ABNORMAL HIGH (ref 0–53)
AST: 60 U/L — ABNORMAL HIGH (ref 0–37)
Albumin: 4.4 g/dL (ref 3.5–5.2)
Alkaline Phosphatase: 93 U/L (ref 39–117)
BUN: 19 mg/dL (ref 6–23)
CO2: 27 mEq/L (ref 19–32)
Calcium: 9.7 mg/dL (ref 8.4–10.5)
Chloride: 103 mEq/L (ref 96–112)
Creatinine, Ser: 1.22 mg/dL (ref 0.40–1.50)
GFR: 70.57 mL/min (ref >60.00)
Glucose, Bld: 103 mg/dL — ABNORMAL HIGH (ref 70–99)
Potassium: 3.7 mEq/L (ref 3.5–5.1)
Sodium: 140 mEq/L (ref 135–145)
Total Bilirubin: 0.3 mg/dL (ref 0.2–1.2)
Total Protein: 6.8 g/dL (ref 6.0–8.3)

## 2019-07-24 LAB — HEMOGLOBIN A1C: Hgb A1c MFr Bld: 7.7 % — ABNORMAL HIGH (ref 4.6–6.5)

## 2019-07-24 MED ORDER — METOPROLOL SUCCINATE ER 100 MG PO TB24: 100.0000 mg | ORAL_TABLET | Freq: Every day | ORAL | 3 refills | Status: DC

## 2019-07-24 MED ORDER — SUCRALFATE 1 GM/10ML PO SUSP: 1.0000 g | Freq: Three times a day (TID) | ORAL | 0 refills | Status: DC

## 2019-07-24 NOTE — Progress Notes (Signed)
Christian [name] is a 74 y.o. male with the following history as recorded in EpicCare:  Patient Active Problem List   Diagnosis Date Noted  . Type 2 diabetes mellitus without complication, without long-term current use of insulin ([place]) 07/24/2019  . Obesity (BMI 30-39.9) 12/02/2018  . Left leg weakness 11/23/2018  . Atherosclerosis of native coronary artery of native heart without angina pectoris 07/30/2018  . Fatigue 10/19/2017  . Routine general medical examination at a health care facility 08/30/2016  . Diabetes mellitus with polyneuropathy ([place]) 10/10/2014  . Benign prostatic hypertrophy 09/27/2014  . S/P PTCA (percutaneous transluminal coronary angioplasty) 02/27/2014  . Other chest pain 02/25/2014  . Hypercholesterolemia 11/12/2009  . MULTIPLE SCLEROSIS 11/12/2009  . Hypertension with heart disease 11/12/2009    Current Outpatient Medications  Medication Sig Dispense Refill  . aspirin EC 81 MG tablet Take 81 mg by mouth daily.    Marland Kitchen atorvastatin (LIPITOR) 20 MG tablet TAKE 1 TABLET BY MOUTH EVERY DAY IN THE EVENING 90 tablet 3  . cloNIDine (CATAPRES) 0.2 MG tablet TAKE 1 TABLET (0.2 MG TOTAL) BY MOUTH DAILY. 90 tablet 2  . cromolyn (OPTICROM) 4 % ophthalmic solution 1 DROP IN BOTH EYES FOUR TIMES A DAY AS NEEDED  12  . furosemide (LASIX) 20 MG tablet TAKE 1 TABLET BY MOUTH EVERY DAY 90 tablet 0  . isosorbide mononitrate (IMDUR) 60 MG 24 hr tablet Take 60 mg by mouth daily.    . metFORMIN (GLUCOPHAGE) 1000 MG tablet TAKE 1 TABLET TWICE A DAY WITH A MEAL 180 tablet 1  . metoprolol succinate (TOPROL-XL) 100 MG 24 hr tablet Take 1 tablet (100 mg total) by mouth daily. Take with or immediately following a meal. 90 tablet 3  . nitroGLYCERIN (NITROSTAT) 0.4 MG SL tablet Place 1 tablet (0.4 mg total) under the tongue every 5 (five) minutes as needed for chest pain. 25 tablet 1  . olmesartan-hydrochlorothiazide (BENICAR HCT) 40-25 MG tablet TAKE 1 TABLET BY MOUTH EVERY DAY 90 tablet 2  .  PREVIDENT 5000 BOOSTER PLUS 1.1 % PSTE APPLY A THIN RIBBON TO TOOTHBRUSH AND BRUSH TEETH FOR 2 MINUTES, USE DAILY    . spironolactone (ALDACTONE) 25 MG tablet Take 1 tablet (25 mg total) by mouth daily. 30 tablet 2  . sucralfate (CARAFATE) 1 GM/10ML suspension Take 10 mLs (1 g total) by mouth 4 (four) times daily -  with meals and at bedtime. 420 mL 0  . tamsulosin (FLOMAX) 0.4 MG CAPS capsule Take 0.4 mg by mouth daily after breakfast.     . verapamil (VERELAN PM) 240 MG 24 hr capsule TAKE 1 CAPSULE BY MOUTH EVERY DAY 90 capsule 3   No current facility-administered medications for this visit.    Allergies: Patient has no known allergies.  Past Medical History:  Diagnosis Date  . BPH (benign prostatic hypertrophy)   . Coronary artery disease   . Diabetes mellitus without complication ([place])   . Dry eyes left  . Hiatal hernia   . Hyperlipidemia   . Hypertension   . Incomplete bladder emptying   . Nocturia   . 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 SOB (shortness of breath) on exertion     Past Surgical History:  Procedure Laterality Date  . CARDIAC CATHETERIZATION    . CORONARY STENT PLACEMENT  02/27/2014   distal rt/pd coronary  dr Einar Gip  . CYSTO/ BLADDER BIOPSY'S/ CAUTHERIZATION  01-14-2004  DR Gaynelle Arabian  . LEFT HEART CATHETERIZATION WITH CORONARY ANGIOGRAM N/A 02/27/2014   Procedure: LEFT HEART CATHETERIZATION WITH CORONARY ANGIOGRAM;  Surgeon: Laverda Page, MD;  Location: [facility] CATH LAB;  Service: Cardiovascular;  Laterality: N/A;  . PERCUTANEOUS CORONARY STENT INTERVENTION (PCI-S)  02/27/2014   Procedure: PERCUTANEOUS CORONARY STENT INTERVENTION (PCI-S);  Surgeon: Laverda Page, MD;  Location: [facility] CATH LAB;  Service: Cardiovascular;;  rt PDA  3.0/28mm Promus stent  . TRANSURETHRAL RESECTION OF PROSTATE  04/04/2012   Procedure: TRANSURETHRAL RESECTION OF THE PROSTATE  WITH GYRUS INSTRUMENTS;  Surgeon: Ailene Rud, MD;  Location: [facility];  Service: Urology;  Laterality: N/A;  . TRANSURETHRAL RESECTION OF PROSTATE N/A 09/27/2014   Procedure: TRANSURETHRAL RESECTION OF THE PROSTATE ;  Surgeon: Carolan Clines, MD;  Location: [facility]S;  Service: Urology;  Laterality: N/A;  . UPPER GASTROINTESTINAL ENDOSCOPY      Family History  Problem Relation Age of Onset  . Diabetes Mother   . Hypertension Mother   . Diabetes Father   . Diabetes Brother   . Hypertension Brother   . Diabetes Brother     Social History   Tobacco Use  . Smoking status: Former Smoker    Packs/day: 0.50    Years: 20.00    Pack years: 10.00    Types: Cigarettes    Quit date: 03/28/1990    Years since quitting: 29.3  . Smokeless tobacco: Never Used  Substance Use Topics  . Alcohol use: Yes    Alcohol/week: 0.0 standard drinks    Comment: 1 beer week rarely    Subjective:  Patient has been having recurrent episodes of chest pain- notes that symptoms have been occurring on and off for the past few years; has already had extensive cardiac work-up this year- was felt not to be cardiac in nature; saw his PCP earlier this month and symptoms felt to be GI in nature; was having some improvement since starting Carafate but symptoms have returned; was recommended to continue OTC Omeprazole at his last OV but he did not understand to continue this medication.  Also notes that he has struggled with feeling short of breath for many years; no prior history of asthma/ COPD; no difficulty sleeping- no diagnosis of sleep apnea;     Objective:  Vitals:   07/24/19 1420  BP: (!) 156/84  Pulse: 98  Temp: 98.4 F (36.9 C)  TempSrc: Oral  SpO2: 99%  Weight: 221 lb 12.8 oz (100.6 kg)  Height: _0  (1.753 m)    General: Well developed, well nourished, in no acute distress  Skin : Warm and dry.  Head: Normocephalic and atraumatic  Eyes: Sclera and conjunctiva clear;  pupils round and reactive to light; extraocular movements intact  Ears: External normal; canals clear; tympanic membranes normal  Oropharynx: Pink, supple. No suspicious lesions  Neck: Supple without thyromegaly, adenopathy  Lungs: Respirations unlabored; clear to auscultation bilaterally without wheeze, rales, rhonchi  CVS exam: normal rate and regular rhythm.  Neurologic: Alert and oriented; speech intact; face symmetrical; moves all extremities well; CNII-XII intact without focal deficit   Assessment:  1. Atypical chest pain   2. Esophageal spasm   3. Type 2 diabetes mellitus without complication, without long-term current use of insulin ([place])     Plan:  1. & 2. Update CXR today; will also update D-dimer; encouraged patient to take both the Omeprazole and Carafate; he did  not understand that he was to take both; update GI referral today- ? Esophageal spasms; Will also need to consider referral to pulmonology; 3. Update Hgba1c today- patient thought he was pre-diabetic/ notes he had never been told he is diabetic.  This visit occurred during the SARS-CoV-2 public health emergency.  Safety protocols were in place, including screening questions prior to the visit, additional usage of staff PPE, and extensive cleaning of exam room while observing appropriate contact time as indicated for disinfecting solutions.     No follow-ups on file.  Orders Placed This Encounter  Procedures  . DG Chest 2 View    Order Specific Question:   Reason for Exam (SYMPTOM  OR DIAGNOSIS REQUIRED)    Answer:   atypical chest pain    Order Specific Question:   Preferred imaging location?    Answer:   [facility]    Order Specific Question:   Radiology Contrast Protocol - do NOT remove file path    Answer:   [email or web address]  . D-Dimer, Quantitative  . Comp Met (CMET)  . HgB A1c  . Ambulatory referral to Gastroenterology    Referral Priority:   Routine     Referral Type:   Consultation    Referral Reason:   Specialty Services Required    Number of Visits Requested:   1    Requested Prescriptions   Signed Prescriptions Disp Refills  . sucralfate (CARAFATE) 1 GM/10ML suspension 420 mL 0    Sig: Take 10 mLs (1 g total) by mouth 4 (four) times daily -  with meals and at bedtime.

## 2019-07-24 NOTE — Telephone Encounter (Signed)
Can have visit with someone at clinic sooner and okay with GI referral.

## 2019-07-25 ENCOUNTER — Other Ambulatory Visit: Payer: Self-pay | Admitting: Family

## 2019-07-25 DIAGNOSIS — R7989 Other specified abnormal findings of blood chemistry: Secondary | ICD-10-CM

## 2019-07-25 DIAGNOSIS — R0600 Dyspnea, unspecified: Secondary | ICD-10-CM

## 2019-07-25 LAB — D-DIMER, QUANTITATIVE: D-Dimer, Quant: 0.32 mcg/mL FEU (ref <0.50)

## 2019-07-25 MED ORDER — AZITHROMYCIN 250 MG PO TABS: ORAL_TABLET | ORAL | 0 refills | Status: DC

## 2019-07-25 MED ORDER — JARDIANCE 10 MG PO TABS: 10.0000 mg | ORAL_TABLET | Freq: Every day | ORAL | 1 refills | Status: DC

## 2019-07-25 NOTE — Progress Notes (Signed)
Will treat for infection; update chest CT and refer to pulmonology.

## 2019-07-26 NOTE — Telephone Encounter (Signed)
Pt was seen by Jodi Mourning

## 2019-07-30 ENCOUNTER — Other Ambulatory Visit: Payer: Self-pay | Admitting: Cardiology

## 2019-07-30 DIAGNOSIS — R600 Localized edema: Secondary | ICD-10-CM

## 2019-07-31 ENCOUNTER — Other Ambulatory Visit: Payer: Self-pay | Admitting: Cardiology

## 2019-07-31 ENCOUNTER — Other Ambulatory Visit: Payer: Self-pay | Admitting: Internal Medicine

## 2019-07-31 DIAGNOSIS — I119 Hypertensive heart disease without heart failure: Secondary | ICD-10-CM

## 2019-07-31 DIAGNOSIS — R600 Localized edema: Secondary | ICD-10-CM

## 2019-08-07 ENCOUNTER — Ambulatory Visit
Admission: RE | Admit: 2019-08-07 | Discharge: 2019-08-07 | Disposition: A | Discharge status: Home / Self Care | Payer: Medicare Other | Source: Ambulatory Visit | Attending: Family | Admitting: Family

## 2019-08-07 DIAGNOSIS — N281 Cyst of kidney, acquired: Secondary | ICD-10-CM | POA: Diagnosis not present

## 2019-08-07 DIAGNOSIS — R7989 Other specified abnormal findings of blood chemistry: Secondary | ICD-10-CM | POA: Diagnosis not present

## 2019-08-08 DIAGNOSIS — M62838 Other muscle spasm: Secondary | ICD-10-CM | POA: Diagnosis not present

## 2019-08-08 DIAGNOSIS — M6281 Muscle weakness (generalized): Secondary | ICD-10-CM | POA: Diagnosis not present

## 2019-08-09 ENCOUNTER — Encounter (HOSPITAL_COMMUNITY)
Admission: EM | Disposition: A | Discharge status: Skilled Nursing Facility | Payer: Self-pay | Source: Home / Self Care | Attending: Cardiothoracic Surgery

## 2019-08-09 ENCOUNTER — Other Ambulatory Visit: Payer: Self-pay

## 2019-08-09 ENCOUNTER — Emergency Department (HOSPITAL_COMMUNITY): Payer: Medicare Other

## 2019-08-09 ENCOUNTER — Encounter: Payer: Self-pay | Admitting: Internal Medicine

## 2019-08-09 ENCOUNTER — Ambulatory Visit (INDEPENDENT_AMBULATORY_CARE_PROVIDER_SITE_OTHER): Payer: Medicare Other | Admitting: Internal Medicine

## 2019-08-09 ENCOUNTER — Inpatient Hospital Stay (HOSPITAL_COMMUNITY)
Admission: EM | Admit: 2019-08-09 | Discharge: 2019-10-18 | DRG: 003 | Disposition: A | Discharge status: Skilled Nursing Facility | Payer: Medicare Other | Attending: Cardiothoracic Surgery | Admitting: Cardiothoracic Surgery

## 2019-08-09 ENCOUNTER — Telehealth: Payer: Self-pay | Admitting: Internal Medicine

## 2019-08-09 ENCOUNTER — Encounter (HOSPITAL_COMMUNITY): Payer: Self-pay | Admitting: Emergency Medicine

## 2019-08-09 VITALS — BP 148/82 | HR 110 | Temp 97.8°F | Ht 69.0 in | Wt 214.6 lb

## 2019-08-09 DIAGNOSIS — Z01818 Encounter for other preprocedural examination: Secondary | ICD-10-CM

## 2019-08-09 DIAGNOSIS — Z87891 Personal history of nicotine dependence: Secondary | ICD-10-CM

## 2019-08-09 DIAGNOSIS — Z743 Need for continuous supervision: Secondary | ICD-10-CM | POA: Diagnosis not present

## 2019-08-09 DIAGNOSIS — I480 Paroxysmal atrial fibrillation: Secondary | ICD-10-CM | POA: Diagnosis not present

## 2019-08-09 DIAGNOSIS — R570 Cardiogenic shock: Secondary | ICD-10-CM | POA: Diagnosis not present

## 2019-08-09 DIAGNOSIS — Z20822 Contact with and (suspected) exposure to covid-19: Secondary | ICD-10-CM | POA: Diagnosis not present

## 2019-08-09 DIAGNOSIS — R918 Other nonspecific abnormal finding of lung field: Secondary | ICD-10-CM | POA: Diagnosis not present

## 2019-08-09 DIAGNOSIS — J9859 Other diseases of mediastinum, not elsewhere classified: Secondary | ICD-10-CM | POA: Diagnosis not present

## 2019-08-09 DIAGNOSIS — J9690 Respiratory failure, unspecified, unspecified whether with hypoxia or hypercapnia: Secondary | ICD-10-CM

## 2019-08-09 DIAGNOSIS — R1310 Dysphagia, unspecified: Secondary | ICD-10-CM

## 2019-08-09 DIAGNOSIS — I469 Cardiac arrest, cause unspecified: Secondary | ICD-10-CM | POA: Diagnosis not present

## 2019-08-09 DIAGNOSIS — D7589 Other specified diseases of blood and blood-forming organs: Secondary | ICD-10-CM | POA: Diagnosis present

## 2019-08-09 DIAGNOSIS — J90 Pleural effusion, not elsewhere classified: Secondary | ICD-10-CM

## 2019-08-09 DIAGNOSIS — K7200 Acute and subacute hepatic failure without coma: Secondary | ICD-10-CM | POA: Diagnosis not present

## 2019-08-09 DIAGNOSIS — A419 Sepsis, unspecified organism: Secondary | ICD-10-CM | POA: Diagnosis not present

## 2019-08-09 DIAGNOSIS — R34 Anuria and oliguria: Secondary | ICD-10-CM | POA: Diagnosis not present

## 2019-08-09 DIAGNOSIS — E1142 Type 2 diabetes mellitus with diabetic polyneuropathy: Secondary | ICD-10-CM | POA: Diagnosis not present

## 2019-08-09 DIAGNOSIS — J95821 Acute postprocedural respiratory failure: Secondary | ICD-10-CM | POA: Diagnosis not present

## 2019-08-09 DIAGNOSIS — I5023 Acute on chronic systolic (congestive) heart failure: Secondary | ICD-10-CM | POA: Diagnosis not present

## 2019-08-09 DIAGNOSIS — R748 Abnormal levels of other serum enzymes: Secondary | ICD-10-CM

## 2019-08-09 DIAGNOSIS — Z9689 Presence of other specified functional implants: Secondary | ICD-10-CM

## 2019-08-09 DIAGNOSIS — N170 Acute kidney failure with tubular necrosis: Secondary | ICD-10-CM | POA: Diagnosis not present

## 2019-08-09 DIAGNOSIS — K567 Ileus, unspecified: Secondary | ICD-10-CM | POA: Diagnosis not present

## 2019-08-09 DIAGNOSIS — M7989 Other specified soft tissue disorders: Secondary | ICD-10-CM

## 2019-08-09 DIAGNOSIS — E1165 Type 2 diabetes mellitus with hyperglycemia: Secondary | ICD-10-CM | POA: Diagnosis present

## 2019-08-09 DIAGNOSIS — R5381 Other malaise: Secondary | ICD-10-CM | POA: Diagnosis not present

## 2019-08-09 DIAGNOSIS — I517 Cardiomegaly: Secondary | ICD-10-CM | POA: Diagnosis not present

## 2019-08-09 DIAGNOSIS — I2510 Atherosclerotic heart disease of native coronary artery without angina pectoris: Secondary | ICD-10-CM | POA: Diagnosis not present

## 2019-08-09 DIAGNOSIS — K660 Peritoneal adhesions (postprocedural) (postinfection): Secondary | ICD-10-CM | POA: Diagnosis present

## 2019-08-09 DIAGNOSIS — Z4659 Encounter for fitting and adjustment of other gastrointestinal appliance and device: Secondary | ICD-10-CM

## 2019-08-09 DIAGNOSIS — I314 Cardiac tamponade: Secondary | ICD-10-CM | POA: Diagnosis not present

## 2019-08-09 DIAGNOSIS — Z79899 Other long term (current) drug therapy: Secondary | ICD-10-CM

## 2019-08-09 DIAGNOSIS — Z86711 Personal history of pulmonary embolism: Secondary | ICD-10-CM

## 2019-08-09 DIAGNOSIS — J440 Chronic obstructive pulmonary disease with acute lower respiratory infection: Secondary | ICD-10-CM | POA: Diagnosis present

## 2019-08-09 DIAGNOSIS — I5021 Acute systolic (congestive) heart failure: Secondary | ICD-10-CM | POA: Diagnosis not present

## 2019-08-09 DIAGNOSIS — N179 Acute kidney failure, unspecified: Secondary | ICD-10-CM | POA: Diagnosis not present

## 2019-08-09 DIAGNOSIS — I208 Other forms of angina pectoris: Secondary | ICD-10-CM | POA: Diagnosis not present

## 2019-08-09 DIAGNOSIS — D6489 Other specified anemias: Secondary | ICD-10-CM | POA: Diagnosis present

## 2019-08-09 DIAGNOSIS — Z955 Presence of coronary angioplasty implant and graft: Secondary | ICD-10-CM | POA: Diagnosis not present

## 2019-08-09 DIAGNOSIS — I5043 Acute on chronic combined systolic (congestive) and diastolic (congestive) heart failure: Secondary | ICD-10-CM | POA: Diagnosis not present

## 2019-08-09 DIAGNOSIS — R7989 Other specified abnormal findings of blood chemistry: Secondary | ICD-10-CM

## 2019-08-09 DIAGNOSIS — I4892 Unspecified atrial flutter: Secondary | ICD-10-CM | POA: Diagnosis not present

## 2019-08-09 DIAGNOSIS — E871 Hypo-osmolality and hyponatremia: Secondary | ICD-10-CM | POA: Diagnosis not present

## 2019-08-09 DIAGNOSIS — I358 Other nonrheumatic aortic valve disorders: Secondary | ICD-10-CM | POA: Diagnosis present

## 2019-08-09 DIAGNOSIS — E782 Mixed hyperlipidemia: Secondary | ICD-10-CM | POA: Diagnosis not present

## 2019-08-09 DIAGNOSIS — R079 Chest pain, unspecified: Secondary | ICD-10-CM | POA: Diagnosis not present

## 2019-08-09 DIAGNOSIS — T829XXA Unspecified complication of cardiac and vascular prosthetic device, implant and graft, initial encounter: Secondary | ICD-10-CM

## 2019-08-09 DIAGNOSIS — Z931 Gastrostomy status: Secondary | ICD-10-CM | POA: Diagnosis not present

## 2019-08-09 DIAGNOSIS — J0410 Acute tracheitis without obstruction: Secondary | ICD-10-CM | POA: Diagnosis not present

## 2019-08-09 DIAGNOSIS — E119 Type 2 diabetes mellitus without complications: Secondary | ICD-10-CM | POA: Diagnosis not present

## 2019-08-09 DIAGNOSIS — I255 Ischemic cardiomyopathy: Secondary | ICD-10-CM

## 2019-08-09 DIAGNOSIS — R262 Difficulty in walking, not elsewhere classified: Secondary | ICD-10-CM | POA: Diagnosis not present

## 2019-08-09 DIAGNOSIS — E785 Hyperlipidemia, unspecified: Secondary | ICD-10-CM | POA: Diagnosis present

## 2019-08-09 DIAGNOSIS — J9811 Atelectasis: Secondary | ICD-10-CM | POA: Diagnosis not present

## 2019-08-09 DIAGNOSIS — I2699 Other pulmonary embolism without acute cor pulmonale: Secondary | ICD-10-CM | POA: Diagnosis not present

## 2019-08-09 DIAGNOSIS — E875 Hyperkalemia: Secondary | ICD-10-CM | POA: Diagnosis not present

## 2019-08-09 DIAGNOSIS — E46 Unspecified protein-calorie malnutrition: Secondary | ICD-10-CM | POA: Diagnosis not present

## 2019-08-09 DIAGNOSIS — I509 Heart failure, unspecified: Secondary | ICD-10-CM | POA: Diagnosis not present

## 2019-08-09 DIAGNOSIS — R945 Abnormal results of liver function studies: Secondary | ICD-10-CM | POA: Diagnosis not present

## 2019-08-09 DIAGNOSIS — R778 Other specified abnormalities of plasma proteins: Secondary | ICD-10-CM

## 2019-08-09 DIAGNOSIS — R0602 Shortness of breath: Secondary | ICD-10-CM | POA: Diagnosis not present

## 2019-08-09 DIAGNOSIS — J9611 Chronic respiratory failure with hypoxia: Secondary | ICD-10-CM | POA: Diagnosis not present

## 2019-08-09 DIAGNOSIS — E1129 Type 2 diabetes mellitus with other diabetic kidney complication: Secondary | ICD-10-CM | POA: Diagnosis not present

## 2019-08-09 DIAGNOSIS — Z951 Presence of aortocoronary bypass graft: Secondary | ICD-10-CM | POA: Diagnosis not present

## 2019-08-09 DIAGNOSIS — I25110 Atherosclerotic heart disease of native coronary artery with unstable angina pectoris: Secondary | ICD-10-CM | POA: Diagnosis present

## 2019-08-09 DIAGNOSIS — Z833 Family history of diabetes mellitus: Secondary | ICD-10-CM

## 2019-08-09 DIAGNOSIS — R54 Age-related physical debility: Secondary | ICD-10-CM | POA: Diagnosis not present

## 2019-08-09 DIAGNOSIS — I214 Non-ST elevation (NSTEMI) myocardial infarction: Secondary | ICD-10-CM | POA: Diagnosis present

## 2019-08-09 DIAGNOSIS — I219 Acute myocardial infarction, unspecified: Secondary | ICD-10-CM

## 2019-08-09 DIAGNOSIS — Z4901 Encounter for fitting and adjustment of extracorporeal dialysis catheter: Secondary | ICD-10-CM | POA: Diagnosis not present

## 2019-08-09 DIAGNOSIS — Z0189 Encounter for other specified special examinations: Secondary | ICD-10-CM

## 2019-08-09 DIAGNOSIS — Z6831 Body mass index (BMI) 31.0-31.9, adult: Secondary | ICD-10-CM

## 2019-08-09 DIAGNOSIS — R0989 Other specified symptoms and signs involving the circulatory and respiratory systems: Secondary | ICD-10-CM

## 2019-08-09 DIAGNOSIS — I130 Hypertensive heart and chronic kidney disease with heart failure and stage 1 through stage 4 chronic kidney disease, or unspecified chronic kidney disease: Secondary | ICD-10-CM | POA: Diagnosis not present

## 2019-08-09 DIAGNOSIS — L8990 Pressure ulcer of unspecified site, unspecified stage: Secondary | ICD-10-CM | POA: Insufficient documentation

## 2019-08-09 DIAGNOSIS — Z452 Encounter for adjustment and management of vascular access device: Secondary | ICD-10-CM | POA: Diagnosis not present

## 2019-08-09 DIAGNOSIS — Z419 Encounter for procedure for purposes other than remedying health state, unspecified: Secondary | ICD-10-CM

## 2019-08-09 DIAGNOSIS — I63411 Cerebral infarction due to embolism of right middle cerebral artery: Secondary | ICD-10-CM | POA: Diagnosis not present

## 2019-08-09 DIAGNOSIS — G9389 Other specified disorders of brain: Secondary | ICD-10-CM | POA: Diagnosis present

## 2019-08-09 DIAGNOSIS — R109 Unspecified abdominal pain: Secondary | ICD-10-CM

## 2019-08-09 DIAGNOSIS — K760 Fatty (change of) liver, not elsewhere classified: Secondary | ICD-10-CM | POA: Diagnosis present

## 2019-08-09 DIAGNOSIS — E11649 Type 2 diabetes mellitus with hypoglycemia without coma: Secondary | ICD-10-CM | POA: Diagnosis not present

## 2019-08-09 DIAGNOSIS — Z7401 Bed confinement status: Secondary | ICD-10-CM | POA: Diagnosis not present

## 2019-08-09 DIAGNOSIS — Z430 Encounter for attention to tracheostomy: Secondary | ICD-10-CM | POA: Diagnosis not present

## 2019-08-09 DIAGNOSIS — Z7984 Long term (current) use of oral hypoglycemic drugs: Secondary | ICD-10-CM

## 2019-08-09 DIAGNOSIS — J151 Pneumonia due to Pseudomonas: Secondary | ICD-10-CM | POA: Diagnosis not present

## 2019-08-09 DIAGNOSIS — M2550 Pain in unspecified joint: Secondary | ICD-10-CM | POA: Diagnosis not present

## 2019-08-09 DIAGNOSIS — I132 Hypertensive heart and chronic kidney disease with heart failure and with stage 5 chronic kidney disease, or end stage renal disease: Secondary | ICD-10-CM | POA: Diagnosis not present

## 2019-08-09 DIAGNOSIS — N281 Cyst of kidney, acquired: Secondary | ICD-10-CM | POA: Diagnosis not present

## 2019-08-09 DIAGNOSIS — N400 Enlarged prostate without lower urinary tract symptoms: Secondary | ICD-10-CM | POA: Diagnosis present

## 2019-08-09 DIAGNOSIS — R6521 Severe sepsis with septic shock: Secondary | ICD-10-CM | POA: Diagnosis not present

## 2019-08-09 DIAGNOSIS — Z09 Encounter for follow-up examination after completed treatment for conditions other than malignant neoplasm: Secondary | ICD-10-CM

## 2019-08-09 DIAGNOSIS — I4891 Unspecified atrial fibrillation: Secondary | ICD-10-CM | POA: Diagnosis not present

## 2019-08-09 DIAGNOSIS — T8132XA Disruption of internal operation (surgical) wound, not elsewhere classified, initial encounter: Secondary | ICD-10-CM | POA: Diagnosis not present

## 2019-08-09 DIAGNOSIS — Z9889 Other specified postprocedural states: Secondary | ICD-10-CM

## 2019-08-09 DIAGNOSIS — I97611 Postprocedural hemorrhage and hematoma of a circulatory system organ or structure following cardiac bypass: Secondary | ICD-10-CM | POA: Diagnosis not present

## 2019-08-09 DIAGNOSIS — Z7982 Long term (current) use of aspirin: Secondary | ICD-10-CM

## 2019-08-09 DIAGNOSIS — I2693 Single subsegmental pulmonary embolism without acute cor pulmonale: Secondary | ICD-10-CM | POA: Diagnosis not present

## 2019-08-09 DIAGNOSIS — I10 Essential (primary) hypertension: Secondary | ICD-10-CM | POA: Diagnosis not present

## 2019-08-09 DIAGNOSIS — Z8249 Family history of ischemic heart disease and other diseases of the circulatory system: Secondary | ICD-10-CM

## 2019-08-09 DIAGNOSIS — Z992 Dependence on renal dialysis: Secondary | ICD-10-CM | POA: Diagnosis not present

## 2019-08-09 DIAGNOSIS — N186 End stage renal disease: Secondary | ICD-10-CM | POA: Diagnosis not present

## 2019-08-09 DIAGNOSIS — Z9911 Dependence on respirator [ventilator] status: Secondary | ICD-10-CM | POA: Diagnosis not present

## 2019-08-09 DIAGNOSIS — E876 Hypokalemia: Secondary | ICD-10-CM | POA: Diagnosis not present

## 2019-08-09 DIAGNOSIS — I50810 Right heart failure, unspecified: Secondary | ICD-10-CM | POA: Diagnosis not present

## 2019-08-09 DIAGNOSIS — D62 Acute posthemorrhagic anemia: Secondary | ICD-10-CM | POA: Diagnosis not present

## 2019-08-09 DIAGNOSIS — S2190XA Unspecified open wound of unspecified part of thorax, initial encounter: Secondary | ICD-10-CM | POA: Diagnosis not present

## 2019-08-09 DIAGNOSIS — R29818 Other symptoms and signs involving the nervous system: Secondary | ICD-10-CM | POA: Diagnosis not present

## 2019-08-09 DIAGNOSIS — J181 Lobar pneumonia, unspecified organism: Secondary | ICD-10-CM | POA: Diagnosis not present

## 2019-08-09 DIAGNOSIS — Z978 Presence of other specified devices: Secondary | ICD-10-CM

## 2019-08-09 DIAGNOSIS — T85598A Other mechanical complication of other gastrointestinal prosthetic devices, implants and grafts, initial encounter: Secondary | ICD-10-CM

## 2019-08-09 DIAGNOSIS — I129 Hypertensive chronic kidney disease with stage 1 through stage 4 chronic kidney disease, or unspecified chronic kidney disease: Secondary | ICD-10-CM | POA: Diagnosis present

## 2019-08-09 DIAGNOSIS — J939 Pneumothorax, unspecified: Secondary | ICD-10-CM

## 2019-08-09 DIAGNOSIS — I639 Cerebral infarction, unspecified: Secondary | ICD-10-CM | POA: Diagnosis not present

## 2019-08-09 DIAGNOSIS — S21109A Unspecified open wound of unspecified front wall of thorax without penetration into thoracic cavity, initial encounter: Secondary | ICD-10-CM | POA: Diagnosis not present

## 2019-08-09 DIAGNOSIS — Z48812 Encounter for surgical aftercare following surgery on the circulatory system: Secondary | ICD-10-CM | POA: Diagnosis not present

## 2019-08-09 DIAGNOSIS — E1169 Type 2 diabetes mellitus with other specified complication: Secondary | ICD-10-CM | POA: Diagnosis present

## 2019-08-09 DIAGNOSIS — D631 Anemia in chronic kidney disease: Secondary | ICD-10-CM | POA: Diagnosis not present

## 2019-08-09 DIAGNOSIS — I9789 Other postprocedural complications and disorders of the circulatory system, not elsewhere classified: Secondary | ICD-10-CM | POA: Diagnosis not present

## 2019-08-09 DIAGNOSIS — I213 ST elevation (STEMI) myocardial infarction of unspecified site: Secondary | ICD-10-CM | POA: Diagnosis not present

## 2019-08-09 DIAGNOSIS — T8131XA Disruption of external operation (surgical) wound, not elsewhere classified, initial encounter: Secondary | ICD-10-CM | POA: Diagnosis not present

## 2019-08-09 DIAGNOSIS — Z4682 Encounter for fitting and adjustment of non-vascular catheter: Secondary | ICD-10-CM

## 2019-08-09 DIAGNOSIS — I97190 Other postprocedural cardiac functional disturbances following cardiac surgery: Secondary | ICD-10-CM | POA: Diagnosis not present

## 2019-08-09 DIAGNOSIS — I97638 Postprocedural hematoma of a circulatory system organ or structure following other circulatory system procedure: Secondary | ICD-10-CM | POA: Diagnosis not present

## 2019-08-09 DIAGNOSIS — E1122 Type 2 diabetes mellitus with diabetic chronic kidney disease: Secondary | ICD-10-CM | POA: Diagnosis present

## 2019-08-09 DIAGNOSIS — N19 Unspecified kidney failure: Secondary | ICD-10-CM | POA: Diagnosis not present

## 2019-08-09 DIAGNOSIS — I252 Old myocardial infarction: Secondary | ICD-10-CM | POA: Diagnosis not present

## 2019-08-09 DIAGNOSIS — J9600 Acute respiratory failure, unspecified whether with hypoxia or hypercapnia: Secondary | ICD-10-CM | POA: Diagnosis not present

## 2019-08-09 DIAGNOSIS — Z930 Tracheostomy status: Secondary | ICD-10-CM | POA: Diagnosis not present

## 2019-08-09 DIAGNOSIS — E669 Obesity, unspecified: Secondary | ICD-10-CM | POA: Diagnosis present

## 2019-08-09 DIAGNOSIS — J9601 Acute respiratory failure with hypoxia: Secondary | ICD-10-CM | POA: Diagnosis not present

## 2019-08-09 DIAGNOSIS — T17908A Unspecified foreign body in respiratory tract, part unspecified causing other injury, initial encounter: Secondary | ICD-10-CM

## 2019-08-09 DIAGNOSIS — R531 Weakness: Secondary | ICD-10-CM | POA: Diagnosis not present

## 2019-08-09 DIAGNOSIS — J984 Other disorders of lung: Secondary | ICD-10-CM | POA: Diagnosis not present

## 2019-08-09 DIAGNOSIS — K3184 Gastroparesis: Secondary | ICD-10-CM | POA: Diagnosis not present

## 2019-08-09 DIAGNOSIS — N1831 Chronic kidney disease, stage 3a: Secondary | ICD-10-CM | POA: Diagnosis present

## 2019-08-09 HISTORY — DX: Non-ST elevation (NSTEMI) myocardial infarction: I21.4

## 2019-08-09 HISTORY — DX: Other pulmonary embolism without acute cor pulmonale: I26.99

## 2019-08-09 HISTORY — DX: Abnormal levels of other serum enzymes: R74.8

## 2019-08-09 HISTORY — DX: Personal history of pulmonary embolism: Z86.711

## 2019-08-09 HISTORY — DX: Acute myocardial infarction, unspecified: I21.9

## 2019-08-09 LAB — CBC
HCT: 40.7 % (ref 39.0–52.0)
Hemoglobin: 13 g/dL (ref 13.0–17.0)
MCH: 32.8 pg (ref 26.0–34.0)
MCHC: 31.9 g/dL (ref 30.0–36.0)
MCV: 102.8 fL — ABNORMAL HIGH (ref 80.0–100.0)
Platelets: 236 10*3/uL (ref 150–400)
RBC: 3.96 MIL/uL — ABNORMAL LOW (ref 4.22–5.81)
RDW: 13.2 % (ref 11.5–15.5)
WBC: 10.7 10*3/uL — ABNORMAL HIGH (ref 4.0–10.5)
nRBC: 0 % (ref 0.0–0.2)

## 2019-08-09 LAB — BASIC METABOLIC PANEL
Anion gap: 14 (ref 5–15)
BUN: 34 mg/dL — ABNORMAL HIGH (ref 8–23)
CO2: 21 mmol/L — ABNORMAL LOW (ref 22–32)
Calcium: 9.7 mg/dL (ref 8.9–10.3)
Chloride: 104 mmol/L (ref 98–111)
Creatinine, Ser: 1.84 mg/dL — ABNORMAL HIGH (ref 0.61–1.24)
GFR calc Af Amer: 42 mL/min — ABNORMAL LOW (ref >60)
GFR calc non Af Amer: 36 mL/min — ABNORMAL LOW (ref >60)
Glucose, Bld: 148 mg/dL — ABNORMAL HIGH (ref 70–99)
Potassium: 4 mmol/L (ref 3.5–5.1)
Sodium: 139 mmol/L (ref 135–145)

## 2019-08-09 LAB — COMPREHENSIVE METABOLIC PANEL
ALT: 62 U/L — ABNORMAL HIGH (ref 0–53)
AST: 57 U/L — ABNORMAL HIGH (ref 0–37)
Albumin: 4.4 g/dL (ref 3.5–5.2)
Alkaline Phosphatase: 93 U/L (ref 39–117)
BUN: 38 mg/dL — ABNORMAL HIGH (ref 6–23)
CO2: 27 mEq/L (ref 19–32)
Calcium: 10.1 mg/dL (ref 8.4–10.5)
Chloride: 103 mEq/L (ref 96–112)
Creatinine, Ser: 1.85 mg/dL — ABNORMAL HIGH (ref 0.40–1.50)
GFR: 43.64 mL/min — ABNORMAL LOW (ref >60.00)
Glucose, Bld: 121 mg/dL — ABNORMAL HIGH (ref 70–99)
Potassium: 4.4 mEq/L (ref 3.5–5.1)
Sodium: 142 mEq/L (ref 135–145)
Total Bilirubin: 0.3 mg/dL (ref 0.2–1.2)
Total Protein: 7 g/dL (ref 6.0–8.3)

## 2019-08-09 LAB — TROPONIN I (HIGH SENSITIVITY)
High Sens Troponin I: 4291 ng/L (ref 2–17)
Troponin I (High Sensitivity): 3309 ng/L (ref <18)
Troponin I (High Sensitivity): 3258 ng/L (ref <18)

## 2019-08-09 LAB — BRAIN NATRIURETIC PEPTIDE
B Natriuretic Peptide: 89.1 pg/mL (ref 0.0–100.0)
Pro B Natriuretic peptide (BNP): 78 pg/mL (ref 0.0–100.0)

## 2019-08-09 LAB — RESPIRATORY PANEL BY RT PCR (FLU A&B, COVID)
Influenza A by PCR: NEGATIVE
Influenza B by PCR: NEGATIVE
SARS Coronavirus 2 by RT PCR: NEGATIVE

## 2019-08-09 LAB — CBG MONITORING, ED: Glucose-Capillary: 206 mg/dL — ABNORMAL HIGH (ref 70–99)

## 2019-08-09 SURGERY — LEFT HEART CATH AND CORONARY ANGIOGRAPHY
Anesthesia: LOCAL

## 2019-08-09 MED ORDER — CROMOLYN SODIUM 4 % OP SOLN
1.0000 [drp] | Freq: Four times a day (QID) | OPHTHALMIC | Status: DC | PRN
  Filled 2019-08-09: qty 10

## 2019-08-09 MED ORDER — ONDANSETRON HCL 4 MG/2ML IJ SOLN: 4.0000 mg | Freq: Four times a day (QID) | INTRAMUSCULAR | Status: DC | PRN

## 2019-08-09 MED ORDER — HEPARIN BOLUS VIA INFUSION
4000.0000 [IU] | Freq: Once | INTRAVENOUS | Status: AC
  Administered 2019-08-09: 4000 [IU] via INTRAVENOUS
  Filled 2019-08-09: qty 4000

## 2019-08-09 MED ORDER — TROSPIUM CHLORIDE 20 MG PO TABS
20.0000 mg | ORAL_TABLET | Freq: Two times a day (BID) | ORAL | Status: DC
  Administered 2019-08-10 – 2019-08-14 (×10): 20 mg via ORAL
  Filled 2019-08-09 (×12): qty 1

## 2019-08-09 MED ORDER — ATORVASTATIN CALCIUM 10 MG PO TABS
20.0000 mg | ORAL_TABLET | Freq: Every evening | ORAL | Status: DC
  Administered 2019-08-09 – 2019-08-14 (×6): 20 mg via ORAL
  Filled 2019-08-09 (×5): qty 2

## 2019-08-09 MED ORDER — MORPHINE SULFATE (PF) 2 MG/ML IV SOLN
2.0000 mg | Freq: Once | INTRAVENOUS | Status: DC
  Filled 2019-08-09: qty 1

## 2019-08-09 MED ORDER — METOPROLOL SUCCINATE ER 100 MG PO TB24
200.0000 mg | ORAL_TABLET | Freq: Every day | ORAL | Status: DC
  Administered 2019-08-09 – 2019-08-14 (×6): 200 mg via ORAL
  Filled 2019-08-09 (×4): qty 2
  Filled 2019-08-09: qty 8
  Filled 2019-08-09: qty 2

## 2019-08-09 MED ORDER — ALPRAZOLAM 0.25 MG PO TABS: 0.2500 mg | ORAL_TABLET | Freq: Two times a day (BID) | ORAL | Status: DC | PRN

## 2019-08-09 MED ORDER — CLONIDINE HCL 0.2 MG PO TABS
0.2000 mg | ORAL_TABLET | Freq: Every day | ORAL | Status: DC
  Administered 2019-08-09 – 2019-08-14 (×6): 0.2 mg via ORAL
  Filled 2019-08-09 (×4): qty 1
  Filled 2019-08-09: qty 2
  Filled 2019-08-09: qty 1

## 2019-08-09 MED ORDER — METOPROLOL TARTRATE 5 MG/5ML IV SOLN
5.0000 mg | Freq: Once | INTRAVENOUS | Status: AC
  Administered 2019-08-09: 5 mg via INTRAVENOUS
  Filled 2019-08-09: qty 5

## 2019-08-09 MED ORDER — ACETAMINOPHEN 325 MG PO TABS: 650.0000 mg | ORAL_TABLET | ORAL | Status: DC | PRN

## 2019-08-09 MED ORDER — TAMSULOSIN HCL 0.4 MG PO CAPS
0.4000 mg | ORAL_CAPSULE | Freq: Every day | ORAL | Status: DC
  Administered 2019-08-10 – 2019-08-26 (×10): 0.4 mg via ORAL
  Filled 2019-08-09 (×13): qty 1

## 2019-08-09 MED ORDER — INSULIN ASPART 100 UNIT/ML ~~LOC~~ SOLN
0.0000 [IU] | Freq: Three times a day (TID) | SUBCUTANEOUS | Status: DC
  Administered 2019-08-10 – 2019-08-11 (×2): 3 [IU] via SUBCUTANEOUS
  Administered 2019-08-12 (×2): 2 [IU] via SUBCUTANEOUS

## 2019-08-09 MED ORDER — NITROGLYCERIN IN D5W 200-5 MCG/ML-% IV SOLN
0.0000 ug/min | INTRAVENOUS | Status: DC
  Administered 2019-08-09: 5 ug/min via INTRAVENOUS
  Administered 2019-08-11 – 2019-08-13 (×2): 2.5 ug/min via INTRAVENOUS
  Administered 2019-08-14: 4.5 ug/min via INTRAVENOUS
  Filled 2019-08-09 (×2): qty 250

## 2019-08-09 MED ORDER — SODIUM CHLORIDE 0.9 % IV SOLN: INTRAVENOUS | Status: DC

## 2019-08-09 MED ORDER — NITROGLYCERIN IN D5W 200-5 MCG/ML-% IV SOLN
0.0000 ug/min | INTRAVENOUS | Status: DC
  Administered 2019-08-09: 5 ug/min via INTRAVENOUS
  Filled 2019-08-09: qty 250

## 2019-08-09 MED ORDER — ASPIRIN 81 MG PO CHEW
324.0000 mg | CHEWABLE_TABLET | Freq: Once | ORAL | Status: AC
  Administered 2019-08-09: 324 mg via ORAL
  Filled 2019-08-09: qty 4

## 2019-08-09 MED ORDER — NITROGLYCERIN 0.4 MG SL SUBL
0.4000 mg | SUBLINGUAL_TABLET | SUBLINGUAL | Status: DC | PRN
  Administered 2019-08-11 – 2019-08-13 (×6): 0.4 mg via SUBLINGUAL
  Filled 2019-08-09 (×4): qty 1

## 2019-08-09 MED ORDER — VERAPAMIL HCL ER 240 MG PO TBCR
240.0000 mg | EXTENDED_RELEASE_TABLET | Freq: Every day | ORAL | Status: DC
  Administered 2019-08-10 – 2019-08-14 (×5): 240 mg via ORAL
  Filled 2019-08-09 (×6): qty 1

## 2019-08-09 MED ORDER — ASPIRIN EC 81 MG PO TBEC
81.0000 mg | DELAYED_RELEASE_TABLET | Freq: Every day | ORAL | Status: DC
  Administered 2019-08-09 – 2019-08-14 (×6): 81 mg via ORAL
  Filled 2019-08-09 (×6): qty 1

## 2019-08-09 MED ORDER — IOHEXOL 350 MG/ML SOLN
60.0000 mL | Freq: Once | INTRAVENOUS | Status: AC | PRN
  Administered 2019-08-09: 60 mL via INTRAVENOUS

## 2019-08-09 MED ORDER — HEPARIN (PORCINE) 25000 UT/250ML-% IV SOLN
1100.0000 [IU]/h | INTRAVENOUS | Status: DC
  Administered 2019-08-09: 1300 [IU]/h via INTRAVENOUS
  Administered 2019-08-10: 1100 [IU]/h via INTRAVENOUS
  Filled 2019-08-09 (×2): qty 250

## 2019-08-09 NOTE — Consult Note (Addendum)
CARDIOLOGY CONSULT NOTE  Patient ID: Christian [name] MRN: [id number] DOB/AGE: 1946-02-03 74 y.o.  Admit date: 08/09/2019 Referring Physician  Nuala Alpha, PA-C Primary Physician:  Hoyt Koch, MD Reason for Consultation  Chest pain  Patient ID: Christian [name], male    DOB: 1945/06/30, 74 y.o.   MRN: [id number]  Chief Complaint  Patient presents with  . Chest Pain   HPI:    Christian [name]  is a 74 y.o. AA male  who presents for  follow-up for CAD, hypertension, hyperlipidemia.  He has history of stenting to the PDA on 02/27/2014.  He was seen by me on 06/01/2019 he was complaining of shortness of breath, mild fatigue but has denied any chest pain except having difficulty swallowing the potassium.  I had started him on spironolactone but his renal function had worsened, hence it was held and repeat BMP, creatinine had nearly normalized to baseline 1.2-1.3.  On questioning now he states that over the past several months he has been having chest pain with exertional activity described as tightness in the middle of the chest, any physical activity will bring on chest discomfort which used to previously last only for 5 to 10 minutes now every episode he has last anywhere from 30 to 40 minutes.  He also states that given walking within the house he has markedly dyspneic and he has to sit down immediately.  Symptoms of chest pain and dyspnea has gotten worse over the past 3 weeks.  He has also noticed worsening leg edema.  He was seen by Dr. Pricilla Holm this afternoon and in view of ongoing chest discomfort was recommended to go to the emergency room.  Patient states that he started to feel better with IV nitroglycerin.  In view of his shortness of breath and chest pain, CT angiogram of the chest was performed which reveals right lobar pulmonary embolism and also positive cardiac troponins suggestive of non-STEMI.  I was consulted for management.  Denies painful swelling of the  lower extremity although he does have chronic leg edema.  Denies recent travel.  Past Medical History:  Diagnosis Date  . BPH (benign prostatic hypertrophy)   . Coronary artery disease   . Diabetes mellitus without complication ([place])   . Dry eyes left  . Hiatal hernia   . Hyperlipidemia   . Hypertension   . Incomplete bladder emptying   . Nocturia   . 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 SOB (shortness of breath) on exertion    Past Surgical History:  Procedure Laterality Date  . CARDIAC CATHETERIZATION    . CORONARY STENT PLACEMENT  02/27/2014   distal rt/pd coronary       dr Einar Gip  . CYSTO/ BLADDER BIOPSY'S/ CAUTHERIZATION  01-14-2004  DR Gaynelle Arabian  . LEFT HEART CATHETERIZATION WITH CORONARY ANGIOGRAM N/A 02/27/2014   Procedure: LEFT HEART CATHETERIZATION WITH CORONARY ANGIOGRAM;  Surgeon: Laverda Page, MD;  Location: [facility] CATH LAB;  Service: Cardiovascular;  Laterality: N/A;  . PERCUTANEOUS CORONARY STENT INTERVENTION (PCI-S)  02/27/2014   Procedure: PERCUTANEOUS CORONARY STENT INTERVENTION (PCI-S);  Surgeon: Laverda Page, MD;  Location: [facility] CATH LAB;  Service: Cardiovascular;;  rt PDA  3.0/28mm Promus stent  . TRANSURETHRAL RESECTION OF PROSTATE  04/04/2012   Procedure: TRANSURETHRAL RESECTION OF THE PROSTATE WITH GYRUS INSTRUMENTS;  Surgeon: Ailene Rud, MD;  Location: [facility];  Service: Urology;  Laterality: N/A;  . TRANSURETHRAL RESECTION OF PROSTATE N/A 09/27/2014   Procedure: TRANSURETHRAL RESECTION OF THE PROSTATE ;  Surgeon: Carolan Clines, MD;  Location: [facility];  Service: Urology;  Laterality: N/A;  . UPPER GASTROINTESTINAL ENDOSCOPY     Social History   Socioeconomic History  . Marital status: Married    Spouse name: Not on file  . Number of children: 2  . Years of education: Not on file  . Highest education level: Not on  file  Occupational History  . Not on file  Tobacco Use  . Smoking status: Former Smoker    Packs/day: 0.50    Years: 20.00    Pack years: 10.00    Types: Cigarettes    Quit date: 03/28/1990    Years since quitting: 29.3  . Smokeless tobacco: Never Used  Substance and Sexual Activity  . Alcohol use: Yes    Alcohol/week: 0.0 standard drinks    Comment: 1 beer week rarely  . Drug use: No  . Sexual activity: Not Currently  Other Topics Concern  . Not on file  Social History Narrative  . Not on file   Social Determinants of Health   Financial Resource Strain:   . Difficulty of Paying Living Expenses:   Food Insecurity:   . Worried About Charity fundraiser in the Last Year:   . Arboriculturist in the Last Year:   Transportation Needs:   . Film/video editor (Medical):   Marland Kitchen Lack of Transportation (Non-Medical):   Physical Activity:   . Days of Exercise per Week:   . Minutes of Exercise per Session:   Stress:   . Feeling of Stress :   Social Connections:   . Frequency of Communication with Friends and Family:   . Frequency of Social Gatherings with Friends and Family:   . Attends Religious Services:   . Active Member of Clubs or Organizations:   . Attends Archivist Meetings:   Marland Kitchen Marital Status:   Intimate Partner Violence:   . Fear of Current or Ex-Partner:   . Emotionally Abused:   Marland Kitchen Physically Abused:   . Sexually Abused:    ROS  Review of Systems  Cardiovascular: Positive for chest pain, dyspnea on exertion and leg swelling.  Musculoskeletal: Positive for arthritis.  Gastrointestinal: Positive for heartburn. Negative for melena.  All other systems reviewed and are negative.  Objective   Vitals with BMI 08/09/2019 08/09/2019 08/09/2019  Height - - -  Weight - - -  BMI - - -  Systolic 725 366 440  Diastolic 74 67 88  Pulse 91 91 101    Blood pressure 128/74, pulse 91, temperature 98 F (36.7 C), temperature source Oral, resp. rate 17, height 5'  9" (1.753 m), weight 97 kg, SpO2 97 %. Body mass index is 31.58 kg/m.    Physical Exam  Constitutional:  Moderately built and mildly obese, in no acute distress.  Cardiovascular: Normal rate, regular rhythm, normal heart sounds and intact distal pulses. Exam reveals no gallop.  No murmur heard. Pulses:      Carotid pulses are 2+ on the right side and 2+ on the left side.      Dorsalis pedis pulses are 2+ on the right side and 2+ on the left side.       Posterior tibial pulses are 0 on the right side and 0 on the left side.  JVD elevated at 8  to 10 cm, bilaterally 2+ pitting edema up to the knee  Pulmonary/Chest: Effort normal and breath sounds normal.  Abdominal: Soft. Bowel sounds are normal.  Obese, nontender.   Laboratory examination:    Recent Labs    06/19/19 1130 07/04/19 1133 07/24/19 1512 08/09/19 0922 08/09/19 1240  NA 141   < > 140 142 139  K 4.2   < > 3.7 4.4 4.0  CL 100   < > 103 103 104  CO2 23   < > 27 27 21*  GLUCOSE 151*   < > 103* 121* 148*  BUN 21   < > 19 38* 34*  CREATININE 1.36*   < > 1.22 1.85* 1.84*  CALCIUM 10.6*   < > 9.7 10.1 9.7  GFRNONAA 52*  --   --   --  36*  GFRAA 60  --   --   --  42*   < > = values in this interval not displayed.   estimated creatinine clearance is 41.7 mL/min (A) (by C-G formula based on SCr of 1.84 mg/dL (H)).  CMP Latest Ref Rng & Units 08/09/2019 08/09/2019 07/24/2019  Glucose 70 - 99 mg/dL 148(H) 121(H) 103(H)  BUN 8 - 23 mg/dL 34(H) 38(H) 19  Creatinine 0.61 - 1.24 mg/dL 1.84(H) 1.85(H) 1.22  Sodium 135 - 145 mmol/L 139 142 140  Potassium 3.5 - 5.1 mmol/L 4.0 4.4 3.7  Chloride 98 - 111 mmol/L 104 103 103  CO2 22 - 32 mmol/L 21(L) 27 27  Calcium 8.9 - 10.3 mg/dL 9.7 10.1 9.7  Total Protein 6.0 - 8.3 g/dL - 7.0 6.8  Total Bilirubin 0.2 - 1.2 mg/dL - 0.3 0.3  Alkaline Phos 39 - 117 U/L - 93 93  AST 0 - 37 U/L - 57(H) 60(H)  ALT 0 - 53 U/L - 62(H) 78(H)   CBC Latest Ref Rng & Units 08/09/2019 07/04/2019 12/29/2018   WBC 4.0 - 10.5 K/uL 10.7(H) 8.6 8.7  Hemoglobin 13.0 - 17.0 g/dL 13.0 13.5 14.7  Hematocrit 39.0 - 52.0 % 40.7 39.6 44.2  Platelets 150 - 400 K/uL 236 193.0 213.0   Lipid Panel     Component Value Date/Time   CHOL 111 11/23/2018 1120   TRIG 203.0 (H) 11/23/2018 1120   HDL 49.20 11/23/2018 1120   CHOLHDL 2 11/23/2018 1120   VLDL 40.6 (H) 11/23/2018 1120   LDLCALC 37 08/27/2016 1343   LDLDIRECT 40.0 11/23/2018 1120   HEMOGLOBIN A1C Lab Results  Component Value Date   HGBA1C 7.7 (H) 07/24/2019   TSH Recent Labs    04/19/19 0911  TSH 2.590   BNP (last 3 results) Recent Labs    08/09/19 1240  BNP 89.1    Ref Range & Units 15:12 08/09/19  12:40  Troponin I (High Sensitivity) <18 ng/L 3,258High Panic   3,309High Panic  CM        BNP (last 3 results) Recent Labs    08/09/19 1240  BNP 89.1    ProBNP (last 3 results) Recent Labs    11/23/18 1120 08/09/19 0922  PROBNP 18.0 78.0    Medications and allergies  No Known Allergies   . heparin 1,300 Units/hr (08/09/19 1708)  . nitroGLYCERIN 5 mcg/min (08/09/19 1708)    Current Outpatient Medications  Medication Instructions  . aspirin EC 81 mg, Oral, Daily  . atorvastatin (LIPITOR) 20 MG tablet TAKE 1 TABLET BY MOUTH EVERY DAY IN THE EVENING  . cloNIDine (CATAPRES) 0.2 mg, Oral, Daily  .  cromolyn (OPTICROM) 4 % ophthalmic solution 1 drop, Both Eyes, 4 times daily PRN  . furosemide (LASIX) 20 MG tablet TAKE 1 TABLET BY MOUTH EVERY DAY  . isosorbide mononitrate (IMDUR) 60 mg, Oral, Daily  . Jardiance 10 mg, Oral, Daily before breakfast  . metFORMIN (GLUCOPHAGE) 1000 MG tablet TAKE 1 TABLET TWICE A DAY WITH A MEAL  . metoprolol succinate (TOPROL-XL) 100 mg, Oral, Daily, Take with or immediately following a meal.  . nitroGLYCERIN (NITROSTAT) 0.4 mg, Sublingual, Every 5 min PRN  . olmesartan-hydrochlorothiazide (BENICAR HCT) 40-25 MG tablet TAKE 1 TABLET BY MOUTH EVERY DAY  . PREVIDENT 5000 BOOSTER PLUS 1.1 % PSTE  1 application, Oral, Daily  . silodosin (RAPAFLO) 8 mg, Oral, Daily at bedtime  . spironolactone (ALDACTONE) 25 mg, Oral, Daily  . sucralfate (CARAFATE) 1 g, Oral, 3 times daily with meals & bedtime  . tamsulosin (FLOMAX) 0.4 mg, Oral, Daily after breakfast  . Trospium Chloride 60 MG CP24 1 capsule, Oral, Daily  . verapamil (VERELAN PM) 240 MG 24 hr capsule TAKE 1 CAPSULE BY MOUTH EVERY DAY    No intake/output data recorded. No intake/output data recorded.    Radiology:   CT angiogram chest 08/09/2019:   1. Small right lower lobe pulmonary embolism. 2. Mild tortuosity, ectasia and calcification of the thoracic aorta but no focal aneurysm or dissection. 3. Extensive three-vessel coronary artery calcifications. 4. 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 5. Stable bilateral adrenal gland nodules consistent with benign adenomas. 6. Aortic atherosclerosis. Aortic Atherosclerosis (ICD10-I70.0). Aortic Atherosclerosis (ICD10-I70.0). Electronically Signed   By: Marijo Sanes M.D.   On: 08/09/2019 15:04   DG Chest 2 View  Result Date: 08/09/2019 CLINICAL DATA:  Chest pain. EXAM: CHEST - 2 VIEW COMPARISON:  July 24, 2019. FINDINGS: Stable cardiomegaly. No pneumothorax or pleural effusion is noted. Both lungs are clear. The visualized skeletal structures are unremarkable. IMPRESSION: No active cardiopulmonary disease. Electronically Signed   By: Marijo Conception M.D.   On: 08/09/2019 13:04  Cardiac Studies:   Coronary angiogram 02/27/2014: Stenting PDA with 3.0 x 28 mm Promus Premier DES. Left coronaries mild disease. LVEF 60%.  Lexiscan myoview stress test 04/13/2016: 1. The resting electrocardiogram demonstrated normal sinus rhythm, normal resting conduction, no resting arrhythmias and normal rest repolarization. Stress EKG is non-diagnostic for ischemia as it a pharmacologic stress using Lexiscan. Stress symptoms included dyspnea and chest discomfort.  Hypertensive at rest and stress with peak BP 192/110 mm Hg. 2. Perfusion imaging study demonstrates soft tissue attenuation artifact in the inferior wall. There is no demonstrable ischemia or scar. LV systolic function was normal at 50% but visually appears to be more than 50%. This is a low risk study. Compared to the study 10/24/2012, small sized inferior ischemia is no longer present. This represents a low risk study.  Echocardiogram 09/17/2017: Left ventricle cavity is normal in size. Mild concentric hypertrophy of the left ventricle. Normal global wall motion. Doppler evidence of grade I (impaired) diastolic dysfunction. Visual EF is 50-55%. Calculated EF 54%. No aortic valve regurgitation noted. Mild aortic valve leaflet thickening. Mild tricuspid regurgitation. No evidence of pulmonary hypertension. Compared to 06/26/2014, no significant change.  EKG: EKG 08/09/2018: Sinus tachycardia at the rate of 120 bpm, leftward axis, incomplete right bundle branch block.  Anteroseptal infarct old.  Nonspecific ST-T abnormality, cannot exclude high lateral ischemia.  Compared to 06/01/2019, normal sinus rhythm with rate of 98 bpm with normal axis without ischemia.  Assessment  1.  Non-ST elevation myocardial infarction 2.  Acute segmental right lower lobe pulmonary embolism 3.  Acute renal failure, stage IIIb. 4.  Hypertension with hypertensive heart disease 5.  Shortness of breath and dyspnea on exertion, also suspect acute diastolic heart failure. 6.  Diabetes mellitus type 2 uncontrolled without hypoglycemia  Recommendations:  Patient presentation is extremely complex, clearly presenting with unstable angina symptoms, however has worsening renal function and also acute pulmonary embolism  Until the decision is made regarding cardiac catheterization, I would recommend continuing IV heparin for now, also IV nitroglycerin chest pain  We will hydrate and follow up on the serum creatinine will also  obtain an echocardiogram.  Patient received 80 mL of contrast for CT angiogram of the chest today.  Blood pressure is uncontrolled, will avoid ACE inhibitor's and ARB, previously was started on spironolactone which is being discontinued but he is presently on olmesartan HCT which will be held.  Patient on high-dose metoprolol succinate 100 mg daily along with verapamil SR 240 mg daily at home in spite of this continues to be tachycardic probably related to acute pulmonary embolism and non-ST elevation myocardial infarction.    Will increase the beta-blocker for now and see how he tolerates. will follow up on his hypertension.  Check BNP (normal), serum troponins have flattened.  1 hour critical care involving life threatening illness including MI and PE  Adrian Prows, MD, [facility] 08/09/2019, 6:55 PM [facility] [place] Office: [phone]

## 2019-08-09 NOTE — ED Provider Notes (Signed)
Spoke to Dr. Einar Gip with cardiology who agrees to admit patient for further treatment of chest pain. COVID test negative.    Karie Kirks 08/09/19 1904    Dorie Rank, MD 08/11/19 [phone]

## 2019-08-09 NOTE — ED Notes (Signed)
Admitting MD paged regarding Nitroglycerin dosage and blood pressure

## 2019-08-09 NOTE — ED Triage Notes (Signed)
Pt endorses central CP for a month. Pain has worn off since getting here. Endorses some dizziness. Hx of stent

## 2019-08-09 NOTE — Assessment & Plan Note (Signed)
I would classify his pain today as anginal stable but progressive. Attempted to contact his cardiologist to see if they can get him in sooner or for stress testing. Given pain episode walking into the office will check troponin. This did result in critical high so he is advised instead to go to ER for immediate action and may need catheterization urgently.

## 2019-08-09 NOTE — Progress Notes (Signed)
   Subjective:   Patient ID: Christian [name], male    DOB: 05-Jan-1946, 74 y.o.   MRN: [id number]  HPI The patient is a 74 YO man coming in for concerns about worsening chest pain. He has seen his cardiologist back in February about this pain. Since that time it has been worsening. His cardiologist told him this was GERD and did EKG which was stable. Last stress test 2017 stable. No prior ECHO in system. Has been having progressive symptoms in the last 2 weeks and the PPI started BID has not helped at all. About 1 month ago we added sucralfate TID to see if this would help but it has not. Currently symptoms are chest pain in the middle of chest with any exertion. This pain is very severe and lasts 30 minutes to 1 hour. Does improve with rest. He has not tried tums or nitro for this pain. He has visit with cardiology and GI in about 2 weeks. Had recent US abdomen without findings except fatty liver. Prior stenting 2015, did not have cardiac symptoms at that time. Seen for similar symptoms 07/24/19 and given refill carafate. Pain has been evolving and changing over the last month.   PMH, [facility], social history reviewed and updated  Review of Systems  Constitutional: Positive for activity change.  HENT: Negative.   Eyes: Negative.   Respiratory: Positive for shortness of breath. Negative for cough and chest tightness.   Cardiovascular: Positive for chest pain. Negative for palpitations and leg swelling.  Gastrointestinal: Negative for abdominal distention, abdominal pain, constipation, diarrhea, nausea and vomiting.  Musculoskeletal: Negative.   Skin: Negative.   Neurological: Negative.   Psychiatric/Behavioral: Negative.     Objective:  Physical Exam Constitutional:      Appearance: He is well-developed.  HENT:     Head: Normocephalic and atraumatic.  Cardiovascular:     Rate and Rhythm: Normal rate and regular rhythm.  Pulmonary:     Effort: Pulmonary effort is normal. No respiratory  distress.     Breath sounds: Normal breath sounds. No wheezing or rales.  Abdominal:     General: Bowel sounds are normal. There is no distension.     Palpations: Abdomen is soft.     Tenderness: There is no abdominal tenderness. There is no rebound.  Musculoskeletal:     Cervical back: Normal range of motion.  Skin:    General: Skin is warm and dry.  Neurological:     Mental Status: He is alert and oriented to person, place, and time.     Coordination: Coordination normal.     Vitals:   08/09/19 0827  BP: (!) 148/82  Pulse: (!) 110  Temp: 97.8 F (36.6 C)  SpO2: 98%  Weight: 214 lb 9.6 oz (97.3 kg)  Height: 5\' 9"  (1.753 m)    This visit occurred during the SARS-CoV-2 public health emergency.  Safety protocols were in place, including screening questions prior to the visit, additional usage of staff PPE, and extensive cleaning of exam room while observing appropriate contact time as indicated for disinfecting solutions.   Assessment & Plan:

## 2019-08-09 NOTE — ED Provider Notes (Signed)
[facility] EMERGENCY DEPARTMENT Provider Note   CSN: [id number] Arrival date & time: 08/09/19  1225     History Chief Complaint  Patient presents with  . Chest Pain    Christian [name] is a 74 y.o. male history of CAD stent x1, diabetes, hypertension, hyperlipidemia.  Patient presents today for chest pain and shortness of breath that has been intermittent x1 month occurring more frequently over the past week. Most recent episode began this morning. He describes central chest tightness severe nonradiating worsened with any movement or exertion he reports that over the past few days he cannot walk across a room without having recurrence of chest pain.  This is associated with shortness of breath described as a inability to catch his breath.  He must sit down for several minutes before chest pain and shortness of breath resolve.  He has no pain at rest.   Denies fever/chills, headache, cough/hemoptysis, abdominal pain, nausea/vomiting, diarrhea, numbness/tingling, weakness or unilateral swelling of the extremities.  He does report bilateral lower edema which she takes Lasix for intermittently.  HPI     Past Medical History:  Diagnosis Date  . BPH (benign prostatic hypertrophy)   . Coronary artery disease   . Diabetes mellitus without complication ([place])   . Dry eyes left  . Hiatal hernia   . Hyperlipidemia   . Hypertension   . Incomplete bladder emptying   . Nocturia   . 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 SOB (shortness of breath) on exertion     Patient Active Problem List   Diagnosis Date Noted  . Type 2 diabetes mellitus without complication, without long-term current use of insulin ([place]) 07/24/2019  . Obesity (BMI 30-39.9) 12/02/2018  . Left leg weakness 11/23/2018  . Atherosclerosis of native coronary artery of native heart without angina  pectoris 07/30/2018  . Fatigue 10/19/2017  . Routine general medical examination at a health care facility 08/30/2016  . Diabetes mellitus with polyneuropathy ([place]) 10/10/2014  . Benign prostatic hypertrophy 09/27/2014  . Stable angina pectoris ([place]) 02/27/2014  . S/P PTCA (percutaneous transluminal coronary angioplasty) 02/27/2014  . Other chest pain 02/25/2014  . Hypercholesterolemia 11/12/2009  . MULTIPLE SCLEROSIS 11/12/2009  . Hypertension with heart disease 11/12/2009    Past Surgical History:  Procedure Laterality Date  . CARDIAC CATHETERIZATION    . CORONARY STENT PLACEMENT  02/27/2014   distal rt/pd coronary       dr Einar Gip  . CYSTO/ BLADDER BIOPSY'S/ CAUTHERIZATION  01-14-2004  DR Gaynelle Arabian  . LEFT HEART CATHETERIZATION WITH CORONARY ANGIOGRAM N/A 02/27/2014   Procedure: LEFT HEART CATHETERIZATION WITH CORONARY ANGIOGRAM;  Surgeon: Laverda Page, MD;  Location: [facility] CATH LAB;  Service: Cardiovascular;  Laterality: N/A;  . PERCUTANEOUS CORONARY STENT INTERVENTION (PCI-S)  02/27/2014   Procedure: PERCUTANEOUS CORONARY STENT INTERVENTION (PCI-S);  Surgeon: Laverda Page, MD;  Location: [facility] CATH LAB;  Service: Cardiovascular;;  rt PDA  3.0/28mm Promus stent  . TRANSURETHRAL RESECTION OF PROSTATE  04/04/2012   Procedure: TRANSURETHRAL RESECTION OF THE PROSTATE WITH GYRUS INSTRUMENTS;  Surgeon: Ailene Rud, MD;  Location: [facility];  Service: Urology;  Laterality: N/A;  . TRANSURETHRAL RESECTION OF PROSTATE N/A 09/27/2014   Procedure: TRANSURETHRAL RESECTION OF THE PROSTATE ;  Surgeon: Carolan Clines, MD;  Location: [facility];  Service: Urology;  Laterality: N/A;  . UPPER GASTROINTESTINAL ENDOSCOPY  Family History  Problem Relation Age of Onset  . Diabetes Mother   . Hypertension Mother   . Diabetes Father   . Diabetes Brother   . Hypertension Brother   . Diabetes Brother     Social History   Tobacco Use  . Smoking status: Former  Smoker    Packs/day: 0.50    Years: 20.00    Pack years: 10.00    Types: Cigarettes    Quit date: 03/28/1990    Years since quitting: 29.3  . Smokeless tobacco: Never Used  Substance Use Topics  . Alcohol use: Yes    Alcohol/week: 0.0 standard drinks    Comment: 1 beer week rarely  . Drug use: No    Home Medications Prior to Admission medications   Medication Sig Start Date End Date Taking? Authorizing Provider  aspirin EC 81 MG tablet Take 81 mg by mouth daily.   Yes [provider]  atorvastatin (LIPITOR) 20 MG tablet TAKE 1 TABLET BY MOUTH EVERY DAY IN THE EVENING Patient taking differently: Take 20 mg by mouth every evening.  06/22/19  Yes Adrian Prows, MD  cloNIDine (CATAPRES) 0.2 MG tablet TAKE 1 TABLET (0.2 MG TOTAL) BY MOUTH DAILY. 06/22/19  Yes Adrian Prows, MD  cromolyn (OPTICROM) 4 % ophthalmic solution Place 1 drop into both eyes 4 (four) times daily as needed (dry eyes).  02/02/17  Yes [provider]  empagliflozin (JARDIANCE) 10 MG TABS tablet Take 10 mg by mouth daily before breakfast. 07/25/19  Yes Marrian Salvage, FNP  furosemide (LASIX) 20 MG tablet TAKE 1 TABLET BY MOUTH EVERY DAY Patient taking differently: Take 20 mg by mouth daily.  08/01/19  Yes Adrian Prows, MD  isosorbide mononitrate (IMDUR) 60 MG 24 hr tablet Take 60 mg by mouth daily.   Yes [provider]  metFORMIN (GLUCOPHAGE) 1000 MG tablet TAKE 1 TABLET TWICE A DAY WITH A MEAL Patient taking differently: Take 1,000 mg by mouth 2 (two) times daily with a meal.  07/31/19  Yes Hoyt Koch, MD  metoprolol succinate (TOPROL-XL) 100 MG 24 hr tablet Take 1 tablet (100 mg total) by mouth daily. Take with or immediately following a meal. 07/24/19  Yes Adrian Prows, MD  nitroGLYCERIN (NITROSTAT) 0.4 MG SL tablet Place 1 tablet (0.4 mg total) under the tongue every 5 (five) minutes as needed for chest pain. 06/01/19  Yes Adrian Prows, MD  olmesartan-hydrochlorothiazide (BENICAR HCT) 40-25 MG  tablet TAKE 1 TABLET BY MOUTH EVERY DAY Patient taking differently: Take 1 tablet by mouth daily.  07/03/19  Yes Adrian Prows, MD  PREVIDENT 5000 BOOSTER PLUS 1.1 % PSTE Take 1 application by mouth daily.  06/14/18  Yes [provider]  silodosin (RAPAFLO) 8 MG CAPS capsule Take 8 mg by mouth at bedtime. 07/27/19  Yes [provider]  spironolactone (ALDACTONE) 25 MG tablet Take 1 tablet (25 mg total) by mouth daily. 06/01/19 08/30/19 Yes Adrian Prows, MD  sucralfate (CARAFATE) 1 GM/10ML suspension Take 10 mLs (1 g total) by mouth 4 (four) times daily -  with meals and at bedtime. 07/24/19  Yes Marrian Salvage, FNP  tamsulosin [facility]) 0.4 MG CAPS capsule Take 0.4 mg by mouth daily after breakfast.  05/20/18  Yes [provider]  Trospium Chloride 60 MG CP24 Take 1 capsule by mouth daily. 07/31/19  Yes [provider]  verapamil (VERELAN PM) 240 MG 24 hr capsule TAKE 1 CAPSULE BY MOUTH EVERY DAY Patient taking differently: Take  240 mg by mouth daily.  07/24/19  Yes Adrian Prows, MD    Allergies    Patient has no known allergies.  Review of Systems   Review of Systems Ten systems are reviewed and are negative for acute change except as noted in the HPI  Physical Exam Updated Vital Signs BP 133/88   Pulse (!) 101   Temp 98 F (36.7 C) (Oral)   Resp (!) 27   Ht 5\' 9"  (1.753 m)   Wt 97 kg   SpO2 100%   BMI 31.58 kg/m   Physical Exam Constitutional:      General: He is not in acute distress.    Appearance: Normal appearance. He is well-developed. He is not ill-appearing or diaphoretic.  HENT:     Head: Normocephalic and atraumatic.     Right Ear: External ear normal.     Left Ear: External ear normal.     Nose: Nose normal.  Eyes:     General: Vision grossly intact. Gaze aligned appropriately.     Pupils: Pupils are equal, round, and reactive to light.  Neck:     Trachea: Trachea and phonation normal. No tracheal deviation.  Cardiovascular:     Rate and  Rhythm: Regular rhythm. Tachycardia present.     Heart sounds: Normal heart sounds.  Pulmonary:     Effort: Pulmonary effort is normal. No respiratory distress.     Breath sounds: Normal breath sounds.  Abdominal:     General: There is no distension.     Palpations: Abdomen is soft.     Tenderness: There is no abdominal tenderness. There is no guarding or rebound.  Musculoskeletal:        General: Normal range of motion.     Cervical back: Normal range of motion.     Right lower leg: No tenderness. Edema present.     Left lower leg: No tenderness. Edema present.  Skin:    General: Skin is warm and dry.  Neurological:     Mental Status: He is alert.     GCS: GCS eye subscore is 4. GCS verbal subscore is 5. GCS motor subscore is 6.     Comments: Speech is clear and goal oriented, follows commands Major Cranial nerves without deficit, no facial droop Moves extremities without ataxia, coordination intact  Psychiatric:        Behavior: Behavior normal.     ED Results / Procedures / Treatments   Labs (all labs ordered are listed, but only abnormal results are displayed) Labs Reviewed  BASIC METABOLIC PANEL - Abnormal; Notable for the following components:      Result Value   CO2 21 (*)    Glucose, Bld 148 (*)    BUN 34 (*)    Creatinine, Ser 1.84 (*)    GFR calc non Af Amer 36 (*)    GFR calc Af Amer 42 (*)    All other components within normal limits  CBC - Abnormal; Notable for the following components:   WBC 10.7 (*)    RBC 3.96 (*)    MCV 102.8 (*)    All other components within normal limits  TROPONIN I (HIGH SENSITIVITY) - Abnormal; Notable for the following components:   Troponin I (High Sensitivity) 3,309 (*)    All other components within normal limits  RESPIRATORY PANEL BY RT PCR (FLU A&B, COVID)  BRAIN NATRIURETIC PEPTIDE  HEPARIN LEVEL (UNFRACTIONATED)  TROPONIN I (HIGH SENSITIVITY)    EKG  Radiology DG Chest 2 View  Result Date: 08/09/2019 CLINICAL  DATA:  Chest pain. EXAM: CHEST - 2 VIEW COMPARISON:  July 24, 2019. FINDINGS: Stable cardiomegaly. No pneumothorax or pleural effusion is noted. Both lungs are clear. The visualized skeletal structures are unremarkable. IMPRESSION: No active cardiopulmonary disease. Electronically Signed   By: Marijo Conception M.D.   On: 08/09/2019 13:04   CT Angio Chest PE W and/or Wo Contrast  Result Date: 08/09/2019 CLINICAL DATA:  Shortness of breath. EXAM: CT ANGIOGRAPHY CHEST WITH CONTRAST TECHNIQUE: Multidetector CT imaging of the chest was performed using the standard protocol during bolus administration of intravenous contrast. Multiplanar CT image reconstructions and MIPs were obtained to evaluate the vascular anatomy. CONTRAST:  73mL OMNIPAQUE IOHEXOL 350 MG/ML SOLN COMPARISON:  CT abdomen 12/18/2003 FINDINGS: Cardiovascular: The heart is within normal limits in size for age. No pericardial effusion. Mild tortuosity, ectasia and calcification of the thoracic aorta but no focal aneurysm or dissection. The branch vessels are patent. Moderate calcification at the left subclavian artery origin. Fairly extensive three-vessel coronary artery calcifications are noted. The pulmonary arterial tree is fairly well opacified. Small filling defects are noted in a right lower lobe pulmonary artery consistent with pulmonary embolism. Do not see any definite left-sided pulmonary emboli. Mediastinum/Nodes: No mediastinal or hilar mass or adenopathy. The esophagus is grossly normal. The thyroid gland is unremarkable. 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 I do not see any worrisome pulmonary lesions or pulmonary nodules. No acute pulmonary findings. No pleural effusion. Upper Abdomen: No significant upper abdominal findings. There are several bilateral adrenal gland nodules unchanged since 2005 and consistent with benign adenomas. Musculoskeletal: No significant  bony findings. Review of the MIP images confirms the above findings. IMPRESSION: 1. Small right lower lobe pulmonary embolism. 2. Mild tortuosity, ectasia and calcification of the thoracic aorta but no focal aneurysm or dissection. 3. Extensive three-vessel coronary artery calcifications. 4. 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 5. Stable bilateral adrenal gland nodules consistent with benign adenomas. 6. Aortic atherosclerosis. Aortic Atherosclerosis (ICD10-I70.0). Aortic Atherosclerosis (ICD10-I70.0). Electronically Signed   By: Marijo Sanes M.D.   On: 08/09/2019 15:04    Procedures .Critical Care Performed by: Deliah Boston, PA-C Authorized by: Deliah Boston, PA-C   Critical care provider statement:    Critical care time (minutes):  45   Critical care was necessary to treat or prevent imminent or life-threatening deterioration of the following conditions:  Cardiac failure   Critical care was time spent personally by me on the following activities:  Discussions with consultants, evaluation of patient's response to treatment, examination of patient, ordering and performing treatments and interventions, ordering and review of laboratory studies, ordering and review of radiographic studies, pulse oximetry, re-evaluation of patient's condition, obtaining history from patient or surrogate, review of old charts and development of treatment plan with patient or surrogate   (including critical care time)  Medications Ordered in ED Medications  morphine 2 MG/ML injection 2 mg (2 mg Intravenous Refused 08/09/19 1337)  nitroGLYCERIN 50 mg in dextrose 5 % 250 mL (0.2 mg/mL) infusion (5 mcg/min Intravenous New Bag/Given 08/09/19 1511)  heparin ADULT infusion 100 units/mL (25000 units/224mL sodium chloride 0.45%) (1,300 Units/hr Intravenous New Bag/Given 08/09/19 1510)  aspirin chewable tablet 324 mg (324 mg Oral Given 08/09/19 1336)  iohexol (OMNIPAQUE) 350  MG/ML injection 60 mL (60 mLs Intravenous Contrast Given 08/09/19 1427)  heparin bolus via infusion 4,000 Units (4,000  Units Intravenous Bolus from Bag 08/09/19 1510)  metoprolol tartrate (LOPRESSOR) injection 5 mg (5 mg Intravenous Given 08/09/19 1504)    ED Course  I have reviewed the triage vital signs and the nursing notes.  Pertinent labs & imaging results that were available during my care of the patient were reviewed by me and considered in my medical decision making (see chart for details).    MDM Rules/Calculators/A&P                     This patient complains of chest pain/shortness of breath, this involves an extensive number of treatment options, and is a complaint that carries with it a high risk of complications and morbidity.  The differential diagnosis includes ACS, PE, dissection, CHF, anemia, pneumonia, COVID-19.  On arrival patient endorses a chest pain only with exertion no active chest pain minimally short of breath.  He is tachycardic with rate 120 on initial evaluation mildly tachypneic, lungs clear.  He has lower extremity edema which appears symmetric.  I have ordered chest pain work-up including CBC, BMP, troponin, BNP as well as chest x-ray.  EKG ordered.  CT angio PE study has been ordered for evaluation of PE as patient is tachycardic and tachypneic.  Blood pressure stable. - I Ordered, reviewed, and interpreted labs, which included CBC, BMP, troponin, BNP.  CBC shows no evidence of anemia, mild elevation of WBC likely secondary to pain, no infectious type symptoms.  BMP shows AKI with creatinine 1.84, no emergent electrolyte derangements or gap.  BNP within normal limits doubt heart failure.  High-sensitivity troponin 3309 concerning for ACS.  Chest x-ray:  IMPRESSION:  No active cardiopulmonary disease.  I have personally visualized and interpreted patient's chest x-ray and agree with radiologist interpretation.  EKG: Poor data quality, interpretation may be  adversely affected Sinus tachycardia Possible Left atrial enlargement Incomplete right bundle branch block Anterior infarct , age undetermined T wave abnormality, consider lateral ischemia Abnormal ECG Confirmed by Pattricia Boss [phone]) on 08/09/2019 1:30:39 PM  Patient seen and evaluated by Dr. Marcie Bal as patient was returning from [place].  Chest pain as returned.  IV heparin and nitro drip ordered. - 2:50 PM: Discussed case with cardiologist Dr. Einar Gip who advises in addition to IV heparin and IV nitro to give patient 5 mg IV metoprolol for rate control.  He has asked patient be n.p.o. and plans to take patient for cath.  CTA PE Study: IMPRESSION:  1. Small right lower lobe pulmonary embolism.  2. Mild tortuosity, ectasia and calcification of the thoracic aorta  but no focal aneurysm or dissection.  3. Extensive three-vessel coronary artery calcifications.  4. Calcified pleural plaques consistent with asbestos related  pleural disease.  5. Stable bilateral adrenal gland nodules consistent with benign  adenomas.  6. Aortic atherosclerosis.  - 3:15 PM: Patient reevaluated resting comfortably no acute distress states understanding of care plan he is agreeable for admission.  He has no further questions or concerns, wife at bedside. - 3:54 PM: Patient reevaluated resting comfortably in bed with wife at bedside, he has no questions or concerns reports that he is feeling well.  4:01 PM: Discussed case with Dr. Nadyne Coombes, who is aware of CT results, asks for hospitalist admission. Care handoff given to Charmaine Downs who will discuss case with admitting team.  Note: Portions of this report may have been transcribed using voice recognition software. Every effort was made to ensure accuracy; however, inadvertent computerized transcription errors may still  be present. Final Clinical Impression(s) / ED Diagnoses Final diagnoses:  NSTEMI (non-ST elevated myocardial infarction) [facility])  Other acute  pulmonary embolism without acute cor pulmonale [facility])    Rx / DC Orders ED Discharge Orders    None       Gari Crown 08/09/19 1604    Pattricia Boss, MD 08/10/19 1142

## 2019-08-09 NOTE — Progress Notes (Signed)
ANTICOAGULATION CONSULT NOTE - Follow Up Consult  Pharmacy Consult for Heparin Indication: chest pain/ACS  No Known Allergies  Patient Measurements: Height: 5\' 9"  (175.3 cm) Weight: 97 kg (213 lb 13.5 oz) IBW/kg (Calculated) : 70.7  Vital Signs: Temp: 98 F (36.7 C) (04/14 1229) Temp Source: Oral (04/14 1229) BP: 132/84 (04/14 1400) Pulse Rate: 95 (04/14 1400)  Labs: Recent Labs    08/09/19 0922 08/09/19 1240  HGB  --  13.0  HCT  --  40.7  PLT  --  236  CREATININE 1.85* 1.84*  TROPONINIHS  --  3,309*    Estimated Creatinine Clearance: 41.7 mL/min (A) (by C-G formula based on SCr of 1.84 mg/dL (H)).  Assessment: 74 year old male to begin heparin for chest pain.   With renal insufficiency  Goal of Therapy:  Heparin level 0.3-0.7 units/ml Monitor platelets by anticoagulation protocol: Yes   Plan:  Heparin 4000 unit iv bolus x 1 Heparin drip at 1300 units / hr Heparin level in 8 hours Daily heparin level, CBC  Thank you Anette Guarneri, PharmD [phone]  08/09/2019,2:42 PM

## 2019-08-09 NOTE — Telephone Encounter (Signed)
Received call about critical troponin 4200. Result not in computer yet. Will advise patient go to ER immediately for evaluation given her chest pain associated with this.

## 2019-08-09 NOTE — Telephone Encounter (Signed)
Called patient informed patient to head over to ER due to lab values, and chest pain. Patient understood and stated that he will head over to [facility] ED

## 2019-08-09 NOTE — Patient Instructions (Signed)
We will call the heart doctor's office to see if they can evaluate the heart as I am very worried about your heart causing this pain.

## 2019-08-09 NOTE — ED Notes (Signed)
MD returned page regarding Nitroglycerin dosage. This RN advised MD that Nitro was stopped due to soft pressure (87/58) & that fluid was turned up to restore pressure. MD also advised that pt mentating appropriately, denies chest pain. MD advised this RN to utilize PRN sublingual nitro for chest pain & if pt requires more than 2-3 sublingual, titrate IV nitro, initiating at 2.35mcg

## 2019-08-10 ENCOUNTER — Encounter (HOSPITAL_COMMUNITY): Payer: Self-pay | Admitting: Cardiology

## 2019-08-10 ENCOUNTER — Encounter (HOSPITAL_COMMUNITY)
Admission: EM | Disposition: A | Discharge status: Skilled Nursing Facility | Payer: Self-pay | Source: Home / Self Care | Attending: Cardiothoracic Surgery

## 2019-08-10 ENCOUNTER — Inpatient Hospital Stay (HOSPITAL_COMMUNITY): Payer: Medicare Other

## 2019-08-10 HISTORY — PX: LEFT HEART CATH AND CORONARY ANGIOGRAPHY: CATH118249

## 2019-08-10 LAB — CBC
HCT: 38 % — ABNORMAL LOW (ref 39.0–52.0)
Hemoglobin: 12.4 g/dL — ABNORMAL LOW (ref 13.0–17.0)
MCH: 33.7 pg (ref 26.0–34.0)
MCHC: 32.6 g/dL (ref 30.0–36.0)
MCV: 103.3 fL — ABNORMAL HIGH (ref 80.0–100.0)
Platelets: 219 10*3/uL (ref 150–400)
RBC: 3.68 MIL/uL — ABNORMAL LOW (ref 4.22–5.81)
RDW: 13.2 % (ref 11.5–15.5)
WBC: 8.1 10*3/uL (ref 4.0–10.5)
nRBC: 0 % (ref 0.0–0.2)

## 2019-08-10 LAB — BASIC METABOLIC PANEL
Anion gap: 11 (ref 5–15)
BUN: 20 mg/dL (ref 8–23)
CO2: 23 mmol/L (ref 22–32)
Calcium: 8.8 mg/dL — ABNORMAL LOW (ref 8.9–10.3)
Chloride: 107 mmol/L (ref 98–111)
Creatinine, Ser: 1.42 mg/dL — ABNORMAL HIGH (ref 0.61–1.24)
GFR calc Af Amer: 57 mL/min — ABNORMAL LOW (ref >60)
GFR calc non Af Amer: 49 mL/min — ABNORMAL LOW (ref >60)
Glucose, Bld: 128 mg/dL — ABNORMAL HIGH (ref 70–99)
Potassium: 4.2 mmol/L (ref 3.5–5.1)
Sodium: 141 mmol/L (ref 135–145)

## 2019-08-10 LAB — ECHOCARDIOGRAM COMPLETE
Height: 69 in
Weight: 3368 oz

## 2019-08-10 LAB — COMPREHENSIVE METABOLIC PANEL
ALT: 64 U/L — ABNORMAL HIGH (ref 0–44)
AST: 106 U/L — ABNORMAL HIGH (ref 15–41)
Albumin: 3.6 g/dL (ref 3.5–5.0)
Alkaline Phosphatase: 79 U/L (ref 38–126)
Anion gap: 13 (ref 5–15)
BUN: 29 mg/dL — ABNORMAL HIGH (ref 8–23)
CO2: 19 mmol/L — ABNORMAL LOW (ref 22–32)
Calcium: 8.8 mg/dL — ABNORMAL LOW (ref 8.9–10.3)
Chloride: 106 mmol/L (ref 98–111)
Creatinine, Ser: 1.6 mg/dL — ABNORMAL HIGH (ref 0.61–1.24)
GFR calc Af Amer: 49 mL/min — ABNORMAL LOW (ref >60)
GFR calc non Af Amer: 42 mL/min — ABNORMAL LOW (ref >60)
Glucose, Bld: 152 mg/dL — ABNORMAL HIGH (ref 70–99)
Potassium: 5.4 mmol/L — ABNORMAL HIGH (ref 3.5–5.1)
Sodium: 138 mmol/L (ref 135–145)
Total Bilirubin: 1.2 mg/dL (ref 0.3–1.2)
Total Protein: 6.1 g/dL — ABNORMAL LOW (ref 6.5–8.1)

## 2019-08-10 LAB — GLUCOSE, CAPILLARY
Glucose-Capillary: 163 mg/dL — ABNORMAL HIGH (ref 70–99)
Glucose-Capillary: 111 mg/dL — ABNORMAL HIGH (ref 70–99)
Glucose-Capillary: 113 mg/dL — ABNORMAL HIGH (ref 70–99)
Glucose-Capillary: 168 mg/dL — ABNORMAL HIGH (ref 70–99)

## 2019-08-10 LAB — HEPARIN LEVEL (UNFRACTIONATED)
Heparin Unfractionated: 0.86 IU/mL — ABNORMAL HIGH (ref 0.30–0.70)
Heparin Unfractionated: 0.64 IU/mL (ref 0.30–0.70)
Heparin Unfractionated: 0.61 IU/mL (ref 0.30–0.70)

## 2019-08-10 SURGERY — LEFT HEART CATH AND CORONARY ANGIOGRAPHY
Anesthesia: LOCAL

## 2019-08-10 MED ORDER — ASPIRIN 81 MG PO CHEW: 81.0000 mg | CHEWABLE_TABLET | ORAL | Status: DC

## 2019-08-10 MED ORDER — SODIUM CHLORIDE 0.9 % WEIGHT BASED INFUSION: 1.0000 mL/kg/h | INTRAVENOUS | Status: DC

## 2019-08-10 MED ORDER — MIDAZOLAM HCL 2 MG/2ML IJ SOLN
INTRAMUSCULAR | Status: DC | PRN
  Administered 2019-08-10: 1 mg via INTRAVENOUS

## 2019-08-10 MED ORDER — FENTANYL CITRATE (PF) 100 MCG/2ML IJ SOLN
INTRAMUSCULAR | Status: DC | PRN
  Administered 2019-08-10: 25 ug via INTRAVENOUS

## 2019-08-10 MED ORDER — SODIUM CHLORIDE 0.9 % IV SOLN: 250.0000 mL | INTRAVENOUS | Status: DC | PRN

## 2019-08-10 MED ORDER — SODIUM CHLORIDE 0.9% FLUSH: 3.0000 mL | INTRAVENOUS | Status: DC | PRN

## 2019-08-10 MED ORDER — IOHEXOL 350 MG/ML SOLN
INTRAVENOUS | Status: DC | PRN
  Administered 2019-08-10: 17:00:00 55 mL

## 2019-08-10 MED ORDER — ONDANSETRON HCL 4 MG/2ML IJ SOLN
4.0000 mg | Freq: Four times a day (QID) | INTRAMUSCULAR | Status: DC | PRN
  Administered 2019-08-12 – 2019-08-15 (×3): 4 mg via INTRAVENOUS
  Filled 2019-08-10 (×2): qty 2

## 2019-08-10 MED ORDER — HEPARIN SODIUM (PORCINE) 1000 UNIT/ML IJ SOLN
INTRAMUSCULAR | Status: DC | PRN
  Administered 2019-08-10: 5000 [IU] via INTRAVENOUS

## 2019-08-10 MED ORDER — HEPARIN SODIUM (PORCINE) 1000 UNIT/ML IJ SOLN
INTRAMUSCULAR | Status: AC
  Filled 2019-08-10: qty 1

## 2019-08-10 MED ORDER — FENTANYL CITRATE (PF) 100 MCG/2ML IJ SOLN
INTRAMUSCULAR | Status: AC
  Filled 2019-08-10: qty 2

## 2019-08-10 MED ORDER — HEPARIN (PORCINE) IN NACL 1000-0.9 UT/500ML-% IV SOLN
INTRAVENOUS | Status: DC | PRN
  Administered 2019-08-10: 500 mL

## 2019-08-10 MED ORDER — SODIUM CHLORIDE 0.9% FLUSH
3.0000 mL | Freq: Two times a day (BID) | INTRAVENOUS | Status: DC
  Administered 2019-08-11 – 2019-08-14 (×6): 3 mL via INTRAVENOUS

## 2019-08-10 MED ORDER — HYDRALAZINE HCL 20 MG/ML IJ SOLN: 10.0000 mg | INTRAMUSCULAR | Status: AC | PRN

## 2019-08-10 MED ORDER — SODIUM CHLORIDE 0.9 % WEIGHT BASED INFUSION: 3.0000 mL/kg/h | INTRAVENOUS | Status: DC

## 2019-08-10 MED ORDER — SODIUM CHLORIDE 0.9 % IV SOLN: INTRAVENOUS | Status: DC

## 2019-08-10 MED ORDER — HEPARIN (PORCINE) IN NACL 1000-0.9 UT/500ML-% IV SOLN
INTRAVENOUS | Status: AC
  Filled 2019-08-10: qty 1000

## 2019-08-10 MED ORDER — LABETALOL HCL 5 MG/ML IV SOLN: 10.0000 mg | INTRAVENOUS | Status: AC | PRN

## 2019-08-10 MED ORDER — VERAPAMIL HCL 2.5 MG/ML IV SOLN
INTRAVENOUS | Status: DC | PRN
  Administered 2019-08-10: 5 mL via INTRA_ARTERIAL

## 2019-08-10 MED ORDER — MIDAZOLAM HCL 2 MG/2ML IJ SOLN
INTRAMUSCULAR | Status: AC
  Filled 2019-08-10: qty 2

## 2019-08-10 MED ORDER — HEPARIN (PORCINE) 25000 UT/250ML-% IV SOLN
1350.0000 [IU]/h | INTRAVENOUS | Status: DC
  Administered 2019-08-11 – 2019-08-12 (×2): 1100 [IU]/h via INTRAVENOUS
  Administered 2019-08-13 – 2019-08-14 (×2): 1350 [IU]/h via INTRAVENOUS
  Administered 2019-08-14: 1500 [IU]/h via INTRAVENOUS
  Filled 2019-08-10 (×4): qty 250

## 2019-08-10 MED ORDER — ACETAMINOPHEN 325 MG PO TABS
650.0000 mg | ORAL_TABLET | ORAL | Status: DC | PRN
  Administered 2019-08-13 – 2019-08-14 (×4): 650 mg via ORAL
  Filled 2019-08-10 (×5): qty 2

## 2019-08-10 MED ORDER — SODIUM CHLORIDE 0.9% FLUSH: 3.0000 mL | Freq: Two times a day (BID) | INTRAVENOUS | Status: DC

## 2019-08-10 MED ORDER — LIDOCAINE HCL (PF) 1 % IJ SOLN
INTRAMUSCULAR | Status: AC
  Filled 2019-08-10: qty 30

## 2019-08-10 MED ORDER — LIDOCAINE HCL (PF) 1 % IJ SOLN
INTRAMUSCULAR | Status: DC | PRN
  Administered 2019-08-10: 3 mL

## 2019-08-10 MED ORDER — VERAPAMIL HCL 2.5 MG/ML IV SOLN
INTRAVENOUS | Status: AC
  Filled 2019-08-10: qty 2

## 2019-08-10 SURGICAL SUPPLY — 12 items
CATH 5FR JL3.5 JR4 ANG PIG MP (CATHETERS) ×1 IMPLANT
CATH INFINITI 5 FR 3DRC (CATHETERS) ×1 IMPLANT
CATH INFINITI 5 FR AR1 MOD (CATHETERS) ×1 IMPLANT
DEVICE RAD COMP TR BAND LRG (VASCULAR PRODUCTS) ×1 IMPLANT
GLIDESHEATH SLEND A-KIT 6F 22G (SHEATH) ×1 IMPLANT
GUIDEWIRE INQWIRE 1.5J.035X260 (WIRE) IMPLANT
INQWIRE 1.5J .035X260CM (WIRE) ×2
KIT HEART LEFT (KITS) ×2 IMPLANT
PACK CARDIAC CATHETERIZATION (CUSTOM PROCEDURE TRAY) ×2 IMPLANT
SHEATH PROBE COVER 6X72 (BAG) ×1 IMPLANT
TRANSDUCER W/STOPCOCK (MISCELLANEOUS) ×2 IMPLANT
TUBING CIL FLEX 10 FLL-RA (TUBING) ×2 IMPLANT

## 2019-08-10 NOTE — Progress Notes (Signed)
IV team at bedside for second IV placement with anticipation of Cath, and dominant arm AC IV is disrupting infusion of heparin continuously.

## 2019-08-10 NOTE — Progress Notes (Signed)
   08/10/19 1720  First Vascular Site Assessment  #1 - Location of Site Assessment Right radial  #1 - Vascular Site Assessment Scale Level 0  #1 - Air in TR Band 11 cc  Neurological  Level of Consciousness Alert    Attempted to remove air with signs of bleeding. 1cc returned to TR band for recheck in 20 minutes.  BP 107/70 (BP Location: Left Arm)   Pulse 75   Temp 97.8 F (36.6 C) (Oral)   Resp 16   Ht 5\' 9"  (1.753 m)   Wt 95.5 kg   SpO2 95%   BMI 31.09 kg/m

## 2019-08-10 NOTE — Care Management (Signed)
Per Barrie Lyme [facility]  help desk: Co-pay amount for Eliquis 2.5 mg and or 3m. bid is $47.00.  No PA required Deductible met. Tier 3 Retail pharmacy: [facility]  Ref# [id number]

## 2019-08-10 NOTE — Progress Notes (Addendum)
Subjective:  No further chest pain, still feels short of breath.  Intake/Output from previous day:  I/O last 3 completed shifts: In: -  Out: 700 [Urine:700] No intake/output data recorded.  Blood pressure (!) 145/85, pulse 82, temperature 97.9 F (36.6 C), temperature source Oral, resp. rate 18, height _0  (1.753 m), weight 95.5 kg, SpO2 100 %. Physical Exam  Constitutional:  He is moderately built and mildly obese in no acute distress.  Eyes: Conjunctivae are normal.  Cardiovascular: Normal rate, regular rhythm and normal heart sounds. Exam reveals no gallop.  No murmur heard. Pulses:      Carotid pulses are 2+ on the right side and 2+ on the left side.      Femoral pulses are 2+ on the right side and 2+ on the left side.      Popliteal pulses are 1+ on the right side and 2+ on the left side.       Dorsalis pedis pulses are 1+ on the right side and 2+ on the left side.       Posterior tibial pulses are 0 on the right side and 0 on the left side.  2+  leg edema, no JVD.   Pulmonary/Chest: Effort normal and breath sounds normal.  Abdominal: Soft. Bowel sounds are normal.  Obese   Lab Results: BMP BNP (last 3 results) Recent Labs    08/09/19 1240  BNP 89.1    ProBNP (last 3 results) Recent Labs    11/23/18 1120 08/09/19 0922  PROBNP 18.0 78.0   BMP Latest Ref Rng & Units 08/09/2019 08/09/2019 08/09/2019  Glucose 70 - 99 mg/dL 152(H) 148(H) 121(H)  BUN 8 - 23 mg/dL 29(H) 34(H) 38(H)  Creatinine 0.61 - 1.24 mg/dL 1.60(H) 1.84(H) 1.85(H)  BUN/Creat Ratio 10 - 24 - - -  Sodium 135 - 145 mmol/L 138 139 142  Potassium 3.5 - 5.1 mmol/L 5.4(H) 4.0 4.4  Chloride 98 - 111 mmol/L 106 104 103  CO2 22 - 32 mmol/L 19(L) 21(L) 27  Calcium 8.9 - 10.3 mg/dL 8.8(L) 9.7 10.1   Hepatic Function Latest Ref Rng & Units 08/09/2019 08/09/2019 07/24/2019  Total Protein 6.5 - 8.1 g/dL 6.1(L) 7.0 6.8  Albumin 3.5 - 5.0 g/dL 3.6 4.4 4.4  AST 15 - 41 U/L 106(H) 57(H) 60(H)  ALT 0 - 44 U/L  64(H) 62(H) 78(H)  Alk Phosphatase 38 - 126 U/L 79 93 93  Total Bilirubin 0.3 - 1.2 mg/dL 1.2 0.3 0.3   CBC Latest Ref Rng & Units 08/10/2019 08/09/2019 07/04/2019  WBC 4.0 - 10.5 K/uL 8.1 10.7(H) 8.6  Hemoglobin 13.0 - 17.0 g/dL 12.4(L) 13.0 13.5  Hematocrit 39.0 - 52.0 % 38.0(L) 40.7 39.6  Platelets 150 - 400 K/uL 219 236 193.0   Lipid Panel     Component Value Date/Time   CHOL 111 11/23/2018 1120   TRIG 203.0 (H) 11/23/2018 1120   HDL 49.20 11/23/2018 1120   CHOLHDL 2 11/23/2018 1120   VLDL 40.6 (H) 11/23/2018 1120   LDLCALC 37 08/27/2016 1343   LDLDIRECT 40.0 11/23/2018 1120   Cardiac Panel (last 3 results) No results for input(s): CKTOTAL, CKMB, TROPONINI, RELINDX in the last 72 hours.  HEMOGLOBIN A1C Lab Results  Component Value Date   HGBA1C 7.7 (H) 07/24/2019   TSH Recent Labs    04/19/19 0911  TSH 2.590   No results found for this or any previous visit (from the past 43800 hour(s)).  Scheduled Meds: . aspirin EC  81  mg Oral Daily  . atorvastatin  20 mg Oral QPM  . cloNIDine  0.2 mg Oral Daily  . insulin aspart  0-15 Units Subcutaneous TID WC  . metoprolol succinate  200 mg Oral Daily  .  morphine injection  2 mg Intravenous Once  . tamsulosin  0.4 mg Oral QPC breakfast  . trospium  20 mg Oral BID  . verapamil  240 mg Oral Daily   Continuous Infusions: . sodium chloride 150 mL/hr at 08/10/19 0632  . heparin 1,100 Units/hr (08/10/19 0030)  . nitroGLYCERIN Stopped (08/09/19 2124)   PRN Meds:.acetaminophen, ALPRAZolam, cromolyn, nitroGLYCERIN, ondansetron (ZOFRAN) IV  CT angiogram chest 08/09/2019:   1. Small right lower lobe pulmonary embolism. 2. Mild tortuosity, ectasia and calcification of the thoracic aorta but no focal aneurysm or dissection. 3. Extensive three-vessel coronary artery calcifications. 4. 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 5. Stable bilateral adrenal gland nodules consistent with benign adenomas. 6.  Aortic atherosclerosis. Aortic Atherosclerosis (ICD10-I70.0). Aortic Atherosclerosis (ICD10-I70.0). Electronically Signed   By: Marijo Sanes M.D.   On: 08/09/2019 15:04   DG Chest 2 View  Result Date: 08/09/2019 CLINICAL DATA:  Chest pain. EXAM: CHEST - 2 VIEW COMPARISON:  July 24, 2019. FINDINGS: Stable cardiomegaly. No pneumothorax or pleural effusion is noted. Both lungs are clear. The visualized skeletal structures are unremarkable. IMPRESSION: No active cardiopulmonary disease. Electronically Signed   By: Marijo Conception M.D.   On: 08/09/2019 13:04  Cardiac Studies:   Coronary angiogram 02/27/2014: Stenting PDA with 3.0 x 28 mm Promus Premier DES. Left coronaries mild disease. LVEF 60%.  Lexiscan myoview stress test 04/13/2016: 1. The resting electrocardiogram demonstrated normal sinus rhythm, normal resting conduction, no resting arrhythmias and normal rest repolarization. Stress EKG is non-diagnostic for ischemia as it a pharmacologic stress using Lexiscan. Stress symptoms included dyspnea and chest discomfort. Hypertensive at rest and stress with peak BP 192/110 mm Hg. 2. Perfusion imaging study demonstrates soft tissue attenuation artifact in the inferior wall. There is no demonstrable ischemia or scar. LV systolic function was normal at 50% but visually appears to be more than 50%. This is a low risk study. Compared to the study 10/24/2012, small sized inferior ischemia is no longer present. This represents a low risk study.  Echocardiogram 09/17/2017: Left ventricle cavity is normal in size. Mild concentric hypertrophy of the left ventricle. Normal global wall motion. Doppler evidence of grade I (impaired) diastolic dysfunction. Visual EF is 50-55%. Calculated EF 54%. No aortic valve regurgitation noted. Mild aortic valve leaflet thickening. Mild tricuspid regurgitation. No evidence of pulmonary hypertension. Compared to 06/26/2014, no significant change.  EKG: EKG 08/09/2018:  Sinus tachycardia at the rate of 120 bpm, leftward axis, incomplete right bundle branch block.  Anteroseptal infarct old.  Nonspecific ST-T abnormality, cannot exclude high lateral ischemia.  Compared to 06/01/2019, normal sinus rhythm with rate of 98 bpm with normal axis without ischemia.  Assessment/Plan:  1.  NSTEMI 2.  Acute on chronic renal disease stage IIIa 3.  Diabetes mellitus type 2 uncontrolled without hypoglycemia 4.  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  Essential hypertension 6.  Subsegmental right lower lobe pulmonary embolism 7.  Abnormal LFTs, fatty liver, chronic  Recommendation: Patient is chest pain-free, tolerating high-dose beta-blocker, blood pressure is improved as well.  Renal function was trending up with decreased serum creatinine, pulmonary embolism is very small.  We will proceed with cardiac catheterization if serum creatinine improves.  I will increase  his IV fluids to 150 mL an hour, he has received hydration overnight.  No clinical evidence of heart failure.  Leg edema is chronic. Presently on sliding scale insulin, Metformin has been on hold. Patient is presently on heparin for pulmonary embolism and unstable angina pectoris.  He will need long-term anticoagulation.  I will also perform lower extremity venous duplex to exclude DVT. Abnormal LFTs are stable and improving.  I have discussed with the patient regarding risks and benefits of cardiac catheterization specifically bleeding, need for blood transfusion, acute renal failure, risk is at least 2 to 3%, risk of periprocedural MI, stroke.  Patient is willing to proceed.  Echo pending   Adrian Prows, MD, [facility] 08/10/2019, 8:13 AM [facility] [place] Office: [phone]

## 2019-08-10 NOTE — Progress Notes (Signed)
Per cath-lab prep orders for cath cannot be entered until BMet is resulted.  Awaiting orders to proceed with pt prep.

## 2019-08-10 NOTE — Progress Notes (Signed)
  Echocardiogram 2D Echocardiogram has been performed.  Christian [name] 08/10/2019, 12:11 PM

## 2019-08-10 NOTE — Progress Notes (Signed)
ANTICOAGULATION CONSULT NOTE - Follow Up Consult  Pharmacy Consult for Heparin Indication: chest pain/ACS/PE  No Known Allergies  Patient Measurements: Height: 5\' 9"  (175.3 cm) Weight: 95.5 kg (210 lb 8 oz) IBW/kg (Calculated) : 70.7  Heparin dosing wt: 92 kg  Vital Signs: Temp: 97.6 F (36.4 C) (04/15 0815) Temp Source: Oral (04/15 0815) BP: 130/75 (04/15 0815) Pulse Rate: 89 (04/15 0815)  Labs: Recent Labs    08/09/19 0922 08/09/19 1240 08/09/19 1512 08/09/19 2306 08/10/19 0631 08/10/19 0930  HGB  --  13.0  --   --  12.4*  --   HCT  --  40.7  --   --  38.0*  --   PLT  --  236  --   --  219  --   HEPARINUNFRC  --   --   --  0.86*  --  0.64  CREATININE 1.85* 1.84*  --  1.60*  --   --   TROPONINIHS  --  3,309* 3,258*  --   --   --     Estimated Creatinine Clearance: 47.6 mL/min (A) (by C-G formula based on SCr of 1.6 mg/dL (H)).  Assessment: 74 year old male on heparin for chest pain. CT also found to have small PE. He will get cath one scr improves. Heparin level came back therapeutic this AM.   Goal of Therapy:  Heparin level 0.3-0.7 units/ml Monitor platelets by anticoagulation protocol: Yes   Plan:  Continue heparin 1100 units / hr 6hr confirm heparin level Daily HL and CBC F/u PO anticoagulant post procedures  Onnie Boer, PharmD, BCIDP, AAHIVP, CPP Infectious Disease Pharmacist 08/10/2019 10:53 AM

## 2019-08-10 NOTE — Progress Notes (Signed)
Patient returns from cath lab, without complaints. TR band in place with 11 cc. Attempt to remove 1 cc of air @ 1720 with signs of minimal bleeding. 1cc replaced. Will restart in 20-30 min.

## 2019-08-10 NOTE — Interval H&P Note (Signed)
History and Physical Interval Note:  08/10/2019 3:53 PM  Christian [name]  has presented today for surgery, with the diagnosis of nonstemi.  The various methods of treatment have been discussed with the patient and family. After consideration of risks, benefits and other options for treatment, the patient has consented to  Procedure(s): LEFT HEART CATH AND CORONARY ANGIOGRAPHY (N/A) as a surgical intervention.  The patient's history has been reviewed, patient examined, no change in status, stable for surgery.  I have reviewed the patient's chart and labs.  Questions were answered to the patient's satisfaction.    NSTEMI/Unstable angina, stabilized patient at high risk Link Here: [email or web address] Indication:  Revascularization by PCI or CABG of 1 or more arteries in a patient with NSTEMI or unstable angina with Stabilization after presentation High risk for clinical events  A (7) Indication: 16; Score 7   [place]

## 2019-08-10 NOTE — Progress Notes (Signed)
Patient's wife and grandson are present at bedside. Pt alert/eating with left hand only. No complaints

## 2019-08-10 NOTE — H&P (View-Only) (Signed)
Subjective:  No further chest pain, still feels short of breath.  Intake/Output from previous day:  I/O last 3 completed shifts: In: -  Out: 700 [Urine:700] No intake/output data recorded.  Blood pressure (!) 145/85, pulse 82, temperature 97.9 F (36.6 C), temperature source Oral, resp. rate 18, height _0  (1.753 m), weight 95.5 kg, SpO2 100 %. Physical Exam  Constitutional:  He is moderately built and mildly obese in no acute distress.  Eyes: Conjunctivae are normal.  Cardiovascular: Normal rate, regular rhythm and normal heart sounds. Exam reveals no gallop.  No murmur heard. Pulses:      Carotid pulses are 2+ on the right side and 2+ on the left side.      Femoral pulses are 2+ on the right side and 2+ on the left side.      Popliteal pulses are 1+ on the right side and 2+ on the left side.       Dorsalis pedis pulses are 1+ on the right side and 2+ on the left side.       Posterior tibial pulses are 0 on the right side and 0 on the left side.  2+  leg edema, no JVD.   Pulmonary/Chest: Effort normal and breath sounds normal.  Abdominal: Soft. Bowel sounds are normal.  Obese   Lab Results: BMP BNP (last 3 results) Recent Labs    08/09/19 1240  BNP 89.1    ProBNP (last 3 results) Recent Labs    11/23/18 1120 08/09/19 0922  PROBNP 18.0 78.0   BMP Latest Ref Rng & Units 08/09/2019 08/09/2019 08/09/2019  Glucose 70 - 99 mg/dL 152(H) 148(H) 121(H)  BUN 8 - 23 mg/dL 29(H) 34(H) 38(H)  Creatinine 0.61 - 1.24 mg/dL 1.60(H) 1.84(H) 1.85(H)  BUN/Creat Ratio 10 - 24 - - -  Sodium 135 - 145 mmol/L 138 139 142  Potassium 3.5 - 5.1 mmol/L 5.4(H) 4.0 4.4  Chloride 98 - 111 mmol/L 106 104 103  CO2 22 - 32 mmol/L 19(L) 21(L) 27  Calcium 8.9 - 10.3 mg/dL 8.8(L) 9.7 10.1   Hepatic Function Latest Ref Rng & Units 08/09/2019 08/09/2019 07/24/2019  Total Protein 6.5 - 8.1 g/dL 6.1(L) 7.0 6.8  Albumin 3.5 - 5.0 g/dL 3.6 4.4 4.4  AST 15 - 41 U/L 106(H) 57(H) 60(H)  ALT 0 - 44 U/L  64(H) 62(H) 78(H)  Alk Phosphatase 38 - 126 U/L 79 93 93  Total Bilirubin 0.3 - 1.2 mg/dL 1.2 0.3 0.3   CBC Latest Ref Rng & Units 08/10/2019 08/09/2019 07/04/2019  WBC 4.0 - 10.5 K/uL 8.1 10.7(H) 8.6  Hemoglobin 13.0 - 17.0 g/dL 12.4(L) 13.0 13.5  Hematocrit 39.0 - 52.0 % 38.0(L) 40.7 39.6  Platelets 150 - 400 K/uL 219 236 193.0   Lipid Panel     Component Value Date/Time   CHOL 111 11/23/2018 1120   TRIG 203.0 (H) 11/23/2018 1120   HDL 49.20 11/23/2018 1120   CHOLHDL 2 11/23/2018 1120   VLDL 40.6 (H) 11/23/2018 1120   LDLCALC 37 08/27/2016 1343   LDLDIRECT 40.0 11/23/2018 1120   Cardiac Panel (last 3 results) No results for input(s): CKTOTAL, CKMB, TROPONINI, RELINDX in the last 72 hours.  HEMOGLOBIN A1C Lab Results  Component Value Date   HGBA1C 7.7 (H) 07/24/2019   TSH Recent Labs    04/19/19 0911  TSH 2.590   No results found for this or any previous visit (from the past 43800 hour(s)).  Scheduled Meds: . aspirin EC  81  mg Oral Daily  . atorvastatin  20 mg Oral QPM  . cloNIDine  0.2 mg Oral Daily  . insulin aspart  0-15 Units Subcutaneous TID WC  . metoprolol succinate  200 mg Oral Daily  .  morphine injection  2 mg Intravenous Once  . tamsulosin  0.4 mg Oral QPC breakfast  . trospium  20 mg Oral BID  . verapamil  240 mg Oral Daily   Continuous Infusions: . sodium chloride 150 mL/hr at 08/10/19 0632  . heparin 1,100 Units/hr (08/10/19 0030)  . nitroGLYCERIN Stopped (08/09/19 2124)   PRN Meds:.acetaminophen, ALPRAZolam, cromolyn, nitroGLYCERIN, ondansetron (ZOFRAN) IV  CT angiogram chest 08/09/2019:   1. Small right lower lobe pulmonary embolism. 2. Mild tortuosity, ectasia and calcification of the thoracic aorta but no focal aneurysm or dissection. 3. Extensive three-vessel coronary artery calcifications. 4. 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 5. Stable bilateral adrenal gland nodules consistent with benign adenomas. 6.  Aortic atherosclerosis. Aortic Atherosclerosis (ICD10-I70.0). Aortic Atherosclerosis (ICD10-I70.0). Electronically Signed   By: Marijo Sanes M.D.   On: 08/09/2019 15:04   DG Chest 2 View  Result Date: 08/09/2019 CLINICAL DATA:  Chest pain. EXAM: CHEST - 2 VIEW COMPARISON:  July 24, 2019. FINDINGS: Stable cardiomegaly. No pneumothorax or pleural effusion is noted. Both lungs are clear. The visualized skeletal structures are unremarkable. IMPRESSION: No active cardiopulmonary disease. Electronically Signed   By: Marijo Conception M.D.   On: 08/09/2019 13:04  Cardiac Studies:   Coronary angiogram 02/27/2014: Stenting PDA with 3.0 x 28 mm Promus Premier DES. Left coronaries mild disease. LVEF 60%.  Lexiscan myoview stress test 04/13/2016: 1. The resting electrocardiogram demonstrated normal sinus rhythm, normal resting conduction, no resting arrhythmias and normal rest repolarization. Stress EKG is non-diagnostic for ischemia as it a pharmacologic stress using Lexiscan. Stress symptoms included dyspnea and chest discomfort. Hypertensive at rest and stress with peak BP 192/110 mm Hg. 2. Perfusion imaging study demonstrates soft tissue attenuation artifact in the inferior wall. There is no demonstrable ischemia or scar. LV systolic function was normal at 50% but visually appears to be more than 50%. This is a low risk study. Compared to the study 10/24/2012, small sized inferior ischemia is no longer present. This represents a low risk study.  Echocardiogram 09/17/2017: Left ventricle cavity is normal in size. Mild concentric hypertrophy of the left ventricle. Normal global wall motion. Doppler evidence of grade I (impaired) diastolic dysfunction. Visual EF is 50-55%. Calculated EF 54%. No aortic valve regurgitation noted. Mild aortic valve leaflet thickening. Mild tricuspid regurgitation. No evidence of pulmonary hypertension. Compared to 06/26/2014, no significant change.  EKG: EKG 08/09/2018:  Sinus tachycardia at the rate of 120 bpm, leftward axis, incomplete right bundle branch block.  Anteroseptal infarct old.  Nonspecific ST-T abnormality, cannot exclude high lateral ischemia.  Compared to 06/01/2019, normal sinus rhythm with rate of 98 bpm with normal axis without ischemia.  Assessment/Plan:  1.  NSTEMI 2.  Acute on chronic renal disease stage IIIa 3.  Diabetes mellitus type 2 uncontrolled without hypoglycemia 4.  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  Essential hypertension 6.  Subsegmental right lower lobe pulmonary embolism 7.  Abnormal LFTs, fatty liver, chronic  Recommendation: Patient is chest pain-free, tolerating high-dose beta-blocker, blood pressure is improved as well.  Renal function was trending up with decreased serum creatinine, pulmonary embolism is very small.  We will proceed with cardiac catheterization if serum creatinine improves.  I will increase  his IV fluids to 150 mL an hour, he has received hydration overnight.  No clinical evidence of heart failure.  Leg edema is chronic. Presently on sliding scale insulin, Metformin has been on hold. Patient is presently on heparin for pulmonary embolism and unstable angina pectoris.  He will need long-term anticoagulation.  I will also perform lower extremity venous duplex to exclude DVT. Abnormal LFTs are stable and improving.  I have discussed with the patient regarding risks and benefits of cardiac catheterization specifically bleeding, need for blood transfusion, acute renal failure, risk is at least 2 to 3%, risk of periprocedural MI, stroke.  Patient is willing to proceed.  Echo pending   Adrian Prows, MD, [facility] 08/10/2019, 8:13 AM [facility]. [place] Office: [phone]

## 2019-08-10 NOTE — Progress Notes (Signed)
ANTICOAGULATION CONSULT NOTE - Follow Up Consult  Pharmacy Consult for Heparin Indication: chest pain/ACS  No Known Allergies  Patient Measurements: Height: 5\' 9"  (175.3 cm) Weight: 95.7 kg (211 lb) IBW/kg (Calculated) : 70.7  Heparin dosing wt: 92 kg  Vital Signs: Temp: 97.4 F (36.3 C) (04/14 2241) Temp Source: Oral (04/14 2241) BP: 125/99 (04/14 2241) Pulse Rate: 91 (04/14 2241)  Labs: Recent Labs    08/09/19 0922 08/09/19 1240 08/09/19 1512 08/09/19 2306  HGB  --  13.0  --   --   HCT  --  40.7  --   --   PLT  --  236  --   --   HEPARINUNFRC  --   --   --  0.86*  CREATININE 1.85* 1.84*  --  1.60*  TROPONINIHS  --  3,309* 3,258*  --     Estimated Creatinine Clearance: 47.6 mL/min (A) (by C-G formula based on SCr of 1.6 mg/dL (H)).  Assessment: 74 year old male on heparin for chest pain.    Heparin level supratherapeutic (0.86) on gtt at 1300 units/hr. No bleeding noted.  Goal of Therapy:  Heparin level 0.3-0.7 units/ml Monitor platelets by anticoagulation protocol: Yes   Plan:  Decrease heparin drip to 1100 units / hr Heparin level in 8 hours  Sherlon Handing, PharmD, BCPS Please see amion for complete clinical pharmacist phone list 08/10/2019,12:23 AM

## 2019-08-10 NOTE — Progress Notes (Signed)
Patient awake alert and oriented during shift report, denies complaints and is in great spirits.

## 2019-08-10 NOTE — Progress Notes (Signed)
ANTICOAGULATION CONSULT NOTE - Follow Up Consult  Pharmacy Consult for Heparin Indication: chest pain/ACS/PE  No Known Allergies  Patient Measurements: Height: 5\' 9"  (175.3 cm) Weight: 95.5 kg (210 lb 8 oz) IBW/kg (Calculated) : 70.7  Heparin dosing wt: 92 kg  Vital Signs: Temp: 97.7 F (36.5 C) (04/15 1222) Temp Source: Oral (04/15 1222) BP: 94/69 (04/15 1222) Pulse Rate: 78 (04/15 1222)  Labs: Recent Labs    08/09/19 1240 08/09/19 1512 08/09/19 2306 08/10/19 0631 08/10/19 0930 08/10/19 1334  HGB 13.0  --   --  12.4*  --   --   HCT 40.7  --   --  38.0*  --   --   PLT 236  --   --  219  --   --   HEPARINUNFRC  --   --  0.86*  --  0.64 0.61  CREATININE 1.84*  --  1.60*  --   --  1.42*  TROPONINIHS 3,309* 3,258*  --   --   --   --     Estimated Creatinine Clearance: 53.6 mL/min (A) (by C-G formula based on SCr of 1.42 mg/dL (H)).  Assessment: 74 year old male on heparin for chest pain. CT also found to have small PE. He will get cath one scr improves. Heparin level came back therapeutic this AM. Heparin now to resume 8hr after sheath pull s/p cath.   Goal of Therapy:  Heparin level 0.3-0.7 units/ml Monitor platelets by anticoagulation protocol: Yes   Plan:  Resume heparin 1100 units/h no bolus at 0030 Recheck heparin level 8hr after restart   Arrie Senate, PharmD, BCPS Clinical Pharmacist [phone] Please check AMION for all [place] numbers 08/10/2019

## 2019-08-11 ENCOUNTER — Inpatient Hospital Stay (HOSPITAL_COMMUNITY): Payer: Medicare Other

## 2019-08-11 ENCOUNTER — Encounter: Payer: Self-pay | Admitting: Cardiology

## 2019-08-11 ENCOUNTER — Other Ambulatory Visit: Payer: Self-pay | Admitting: *Deleted

## 2019-08-11 DIAGNOSIS — I2510 Atherosclerotic heart disease of native coronary artery without angina pectoris: Secondary | ICD-10-CM

## 2019-08-11 DIAGNOSIS — I214 Non-ST elevation (NSTEMI) myocardial infarction: Secondary | ICD-10-CM

## 2019-08-11 DIAGNOSIS — I2699 Other pulmonary embolism without acute cor pulmonale: Secondary | ICD-10-CM

## 2019-08-11 DIAGNOSIS — M7989 Other specified soft tissue disorders: Secondary | ICD-10-CM

## 2019-08-11 DIAGNOSIS — Z955 Presence of coronary angioplasty implant and graft: Secondary | ICD-10-CM

## 2019-08-11 DIAGNOSIS — I255 Ischemic cardiomyopathy: Secondary | ICD-10-CM

## 2019-08-11 DIAGNOSIS — I25110 Atherosclerotic heart disease of native coronary artery with unstable angina pectoris: Secondary | ICD-10-CM

## 2019-08-11 DIAGNOSIS — E119 Type 2 diabetes mellitus without complications: Secondary | ICD-10-CM

## 2019-08-11 LAB — CBC
HCT: 37.1 % — ABNORMAL LOW (ref 39.0–52.0)
Hemoglobin: 12.1 g/dL — ABNORMAL LOW (ref 13.0–17.0)
MCH: 33.5 pg (ref 26.0–34.0)
MCHC: 32.6 g/dL (ref 30.0–36.0)
MCV: 102.8 fL — ABNORMAL HIGH (ref 80.0–100.0)
Platelets: 189 10*3/uL (ref 150–400)
RBC: 3.61 MIL/uL — ABNORMAL LOW (ref 4.22–5.81)
RDW: 13.1 % (ref 11.5–15.5)
WBC: 6.2 10*3/uL (ref 4.0–10.5)
nRBC: 0 % (ref 0.0–0.2)

## 2019-08-11 LAB — LIPID PANEL
Cholesterol: 107 mg/dL (ref 0–200)
HDL: 42 mg/dL (ref >40)
LDL Cholesterol: 40 mg/dL (ref 0–99)
Total CHOL/HDL Ratio: 2.5 RATIO
Triglycerides: 125 mg/dL (ref <150)
VLDL: 25 mg/dL (ref 0–40)

## 2019-08-11 LAB — GLUCOSE, CAPILLARY
Glucose-Capillary: 97 mg/dL (ref 70–99)
Glucose-Capillary: 110 mg/dL — ABNORMAL HIGH (ref 70–99)
Glucose-Capillary: 168 mg/dL — ABNORMAL HIGH (ref 70–99)
Glucose-Capillary: 182 mg/dL — ABNORMAL HIGH (ref 70–99)

## 2019-08-11 LAB — HEPARIN LEVEL (UNFRACTIONATED): Heparin Unfractionated: 0.41 IU/mL (ref 0.30–0.70)

## 2019-08-11 NOTE — Progress Notes (Signed)
Pt complaining of chest pain 10/10 during this time.  MD at the bedside.  RN administered Nitro SL q 46mins x 2 with chest pain improving per pt.  Nitro gtt started per MD verbal order at 2.55mcq/min with BP elevated.  MD has instructed dayshift nurse to D/C gtt after 30 mins if chest pain is relieved and BP is stable.

## 2019-08-11 NOTE — Progress Notes (Signed)
Progress Note  Patient Name: Christian [name] Date of Encounter: 08/11/2019  Attending physician: Adrian Prows, MD Primary care provider: Hoyt Koch, MD Primary Cardiologist: Dr. Adrian Prows  Subjective: Christian [name] is a 74 y.o. male  presented to the hospital with a chief complaint of " chest pain."  Patient seen and examined at bedside at approximately 7:10 AM Patient had one episode of chest discomfort approximately 1 AM which was relieved with 1 sublingual nitroglycerin tablet.  When I walked into the room patient was having substernal chest discomfort.  The pain intensity was high as 8 out of 10, pressure-like feeling, located substernally.  Patient was given 1 tablet of sublingual nitroglycerin tablet with no significant change.  Blood pressure was checked and systolic blood pressure is 184 mmHg.  Patient was given a second tablet of sublingual nitroglycerin 5 minutes later which helped resolve the pain.  Upon receiving the second nitro pill his systolic blood pressures continued to be around 180 mmHg.  Recommended nitro drip to be started.   Patient's chest pain has resolved and is currently also on heparin drip.  Patient has done well since left heart catheterization yesterday, no acute complications.  Case discussed and reviewed with his nurse.  Objective: Vital Signs in the last 24 hours: Temp:  [97.4 F (36.3 C)-98.4 F (36.9 C)] 98.4 F (36.9 C) (04/16 0328) Pulse Rate:  [73-112] 112 (04/16 0712) Resp:  [16-20] 19 (04/16 0328) BP: (94-184)/(62-105) 184/105 (04/16 0712) SpO2:  [95 %-100 %] 100 % (04/16 0328) Weight:  [96.6 kg] 96.6 kg (04/16 0128)  Intake/Output from previous day: 04/15 0701 - 04/16 0700 In: 4544.1 [P.O.:480; I.V.:4064.1] Out: 1575 [LGXQJ:1941]  Weights:  Filed Weights   08/09/19 2200 08/10/19 0336 08/11/19 0128  Weight: 95.7 kg 95.5 kg 96.6 kg    Telemetry: Personally reviewed.  Physical examination: GEN:  Age-appropriate male,  hemodynamically stable, mild distress Neck: +JVP Cardiac: RRR, no murmurs, rubs, or gallops.  Respiratory: Clear to auscultation bilaterally. GI: Soft, nontender, non-distended  Extremity:  2+ bilateral edema; No deformity. Neuro:  Nonfocal  Psych: Normal affect   Lab Results: Hematology Recent Labs  Lab 08/09/19 1240 08/10/19 0631 08/11/19 0426  WBC 10.7* 8.1 6.2  RBC 3.96* 3.68* 3.61*  HGB 13.0 12.4* 12.1*  HCT 40.7 38.0* 37.1*  MCV 102.8* 103.3* 102.8*  MCH 32.8 33.7 33.5  MCHC 31.9 32.6 32.6  RDW 13.2 13.2 13.1  PLT 236 219 189    Chemistry Recent Labs  Lab 08/09/19 0922 08/09/19 0922 08/09/19 1240 08/09/19 2306 08/10/19 1334  NA 142   < > 139 138 141  K 4.4   < > 4.0 5.4* 4.2  CL 103   < > 104 106 107  CO2 27   < > 21* 19* 23  GLUCOSE 121*   < > 148* 152* 128*  BUN 38*   < > 34* 29* 20  CREATININE 1.85*   < > 1.84* 1.60* 1.42*  CALCIUM 10.1   < > 9.7 8.8* 8.8*  PROT 7.0  --   --  6.1*  --   ALBUMIN 4.4  --   --  3.6  --   AST 57*  --   --  106*  --   ALT 62*  --   --  64*  --   ALKPHOS 93  --   --  79  --   BILITOT 0.3  --   --  1.2  --   GFRNONAA  --   --  36* 42* 49*  GFRAA  --   --  42* 49* 57*  ANIONGAP  --   --  14 13 11    < > = values in this interval not displayed.     Hepatic Function Panel  Recent Labs    07/24/19 1512 08/09/19 0922 08/09/19 2306  PROT 6.8 7.0 6.1*  ALBUMIN 4.4 4.4 3.6  AST 60* 57* 106*  ALT 78* 62* 64*  ALKPHOS 93 93 79  BILITOT 0.3 0.3 1.2    Cardiac Enzymes: Recent Labs    07/04/19 1133  CKTOTAL 56   Lab Results  Component Value Date   CKTOTAL 56 07/04/2019     BNP Recent Labs  Lab 08/09/19 0922 08/09/19 1240  BNP  --  89.1  PROBNP 78.0  --      DDimer No results for input(s): DDIMER in the last 168 hours.   Hemoglobin A1c:  Lab Results  Component Value Date   HGBA1C 7.7 (H) 07/24/2019   TSH  Recent Labs    04/19/19 0911  TSH 2.590    Lipid Panel     Component Value Date/Time    CHOL 107 08/11/2019 0426   TRIG 125 08/11/2019 0426   HDL 42 08/11/2019 0426   CHOLHDL 2.5 08/11/2019 0426   VLDL 25 08/11/2019 0426   LDLCALC 40 08/11/2019 0426   LDLDIRECT 40.0 11/23/2018 1120    Imaging: DG Chest 2 View  Result Date: 08/09/2019 CLINICAL DATA:  Chest pain. EXAM: CHEST - 2 VIEW COMPARISON:  July 24, 2019. FINDINGS: Stable cardiomegaly. No pneumothorax or pleural effusion is noted. Both lungs are clear. The visualized skeletal structures are unremarkable. IMPRESSION: No active cardiopulmonary disease. Electronically Signed   By: Marijo Conception M.D.   On: 08/09/2019 13:04   CT Angio Chest PE W and/or Wo Contrast  Result Date: 08/09/2019 CLINICAL DATA:  Shortness of breath. EXAM: CT ANGIOGRAPHY CHEST WITH CONTRAST TECHNIQUE: Multidetector CT imaging of the chest was performed using the standard protocol during bolus administration of intravenous contrast. Multiplanar CT image reconstructions and MIPs were obtained to evaluate the vascular anatomy. CONTRAST:  68mL OMNIPAQUE IOHEXOL 350 MG/ML SOLN COMPARISON:  CT abdomen 12/18/2003 FINDINGS: Cardiovascular: The heart is within normal limits in size for age. No pericardial effusion. Mild tortuosity, ectasia and calcification of the thoracic aorta but no focal aneurysm or dissection. The branch vessels are patent. Moderate calcification at the left subclavian artery origin. Fairly extensive three-vessel coronary artery calcifications are noted. The pulmonary arterial tree is fairly well opacified. Small filling defects are noted in a right lower lobe pulmonary artery consistent with pulmonary embolism. Do not see any definite left-sided pulmonary emboli. Mediastinum/Nodes: No mediastinal or hilar mass or adenopathy. The esophagus is grossly normal. The thyroid gland is unremarkable. 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 I do not see any worrisome pulmonary lesions or  pulmonary nodules. No acute pulmonary findings. No pleural effusion. Upper Abdomen: No significant upper abdominal findings. There are several bilateral adrenal gland nodules unchanged since 2005 and consistent with benign adenomas. Musculoskeletal: No significant bony findings. Review of the MIP images confirms the above findings. IMPRESSION: 1. Small right lower lobe pulmonary embolism. 2. Mild tortuosity, ectasia and calcification of the thoracic aorta but no focal aneurysm or dissection. 3. Extensive three-vessel coronary artery calcifications. 4. 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 5. Stable bilateral adrenal gland nodules consistent with benign adenomas. 6. Aortic atherosclerosis. Aortic Atherosclerosis (  ICD10-I70.0). Aortic Atherosclerosis (ICD10-I70.0). Electronically Signed   By: Marijo Sanes M.D.   On: 08/09/2019 15:04   CARDIAC CATHETERIZATION  Result Date: 08/10/2019 LM: Normal LAD: Ostial 95% stenosis Ramus: Ostial/prox 30% stenosis LCx: Ostial 75% stenosis RCA: Prox-mid diffuse 40% calcific disease         Distal RCA 70% ISR LVEDP 17 mmHg  ECHOCARDIOGRAM COMPLETE  Result Date: 08/10/2019    ECHOCARDIOGRAM REPORT   Patient Name:   ZAVIEN CLUBB Date of Exam: 08/10/2019 Medical Rec #:  [id number]       Height:       69.0 in Accession #:    [id number]      Weight:       210.5 lb Date of Birth:  June 01, 1945      BSA:          2.111 m Patient Age:    74 years        BP:           130/75 mmHg Patient Gender: M               HR:           78 bpm. Exam Location:  Inpatient Procedure: 2D Echo Indications:    coronary artery disease. pulmonary embolus.  History:        Patient has no prior history of Echocardiogram examinations.                 Signs/Symptoms:Shortness of Breath.  Sonographer:    Johny Chess RDCS Referring Phys: [place]  1. Left ventricular ejection fraction, by estimation, is 30 to 35%. The left ventricle has severely  decreased function. The left ventricle demonstrates global hypokinesis. Left ventricular diastolic parameters are consistent with Grade II diastolic dysfunction (pseudonormalization). Elevated left ventricular end-diastolic pressure. There is severe hypokinesis of the left ventricular, entire anterior wall and septal wall.  2. Right ventricular systolic function is normal. The right ventricular size is normal. There is normal pulmonary artery systolic pressure. The estimated right ventricular systolic pressure is 54.0 mmHg.  3. The mitral valve is normal in structure. Trivial mitral valve regurgitation. No evidence of mitral stenosis.  4. The aortic valve is normal in structure. Aortic valve regurgitation is not visualized. Mild aortic valve sclerosis is present, with no evidence of aortic valve stenosis.  5. The inferior vena cava is dilated in size with >50% respiratory variability, suggesting right atrial pressure of 8 mmHg. FINDINGS  Left Ventricle: Left ventricular ejection fraction, by estimation, is 30 to 35%. The left ventricle has severely decreased function. The left ventricle demonstrates global hypokinesis. Severe hypokinesis of the left ventricular, entire anterior wall and  septal wall. The left ventricular internal cavity size was normal in size. There is no left ventricular hypertrophy. Left ventricular diastolic parameters are consistent with Grade II diastolic dysfunction (pseudonormalization). Elevated left ventricular end-diastolic pressure. Right Ventricle: The right ventricular size is normal. No increase in right ventricular wall thickness. Right ventricular systolic function is normal. There is normal pulmonary artery systolic pressure. The tricuspid regurgitant velocity is 1.23 m/s, and  with an assumed right atrial pressure of 8 mmHg, the estimated right ventricular systolic pressure is 08.6 mmHg. Left Atrium: Left atrial size was normal in size. Right Atrium: Right atrial size was normal  in size. Pericardium: There is no evidence of pericardial effusion. Mitral Valve: The mitral valve is normal in structure. Normal mobility of the mitral valve leaflets. Trivial mitral valve regurgitation.  No evidence of mitral valve stenosis. Tricuspid Valve: The tricuspid valve is normal in structure. Tricuspid valve regurgitation is trivial. No evidence of tricuspid stenosis. Aortic Valve: The aortic valve is normal in structure. Aortic valve regurgitation is not visualized. Mild aortic valve sclerosis is present, with no evidence of aortic valve stenosis. Pulmonic Valve: The pulmonic valve was not well visualized. Pulmonic valve regurgitation is trivial. Aorta: The aortic root is normal in size and structure. Venous: The inferior vena cava is dilated in size with greater than 50% respiratory variability, suggesting right atrial pressure of 8 mmHg. IAS/Shunts: The interatrial septum was not assessed.  LEFT VENTRICLE PLAX 2D LVIDd:         5.30 cm  Diastology LVIDs:         4.30 cm  LV e' lateral:   6.13 cm/s LV PW:         1.00 cm  LV E/e' lateral: 11.3 LV IVS:        1.10 cm  LV e' medial:    4.38 cm/s LVOT diam:     2.00 cm  LV E/e' medial:  15.8 LV SV:         49 LV SV Index:   23 LVOT Area:     3.14 cm  RIGHT VENTRICLE RV S prime:     8.85 cm/s TAPSE (M-mode): 1.6 cm LEFT ATRIUM             Index LA diam:        2.90 cm 1.37 cm/m LA Vol (A2C):   34.4 ml 16.29 ml/m LA Vol (A4C):   33.6 ml 15.91 ml/m LA Biplane Vol: 35.8 ml 16.96 ml/m  AORTIC VALVE LVOT Vmax:   82.90 cm/s LVOT Vmean:  54.600 cm/s LVOT VTI:    0.157 m  AORTA Ao Root diam: 2.90 cm MITRAL VALVE               TRICUSPID VALVE MV Area (PHT): 4.39 cm    TR Peak grad:   6.1 mmHg MV Decel Time: 173 msec    TR Vmax:        123.00 cm/s MV E velocity: 69.40 cm/s MV A velocity: 79.70 cm/s  SHUNTS MV E/A ratio:  0.87        Systemic VTI:  0.16 m                            Systemic Diam: 2.00 cm Adrian Prows MD Electronically signed by Adrian Prows MD  Signature Date/Time: 08/10/2019/1:42:14 PM    Final     Cardiac database: EKG: EKG 08/09/2018: Sinus tachycardia at the rate of 120 bpm, leftward axis, incomplete right bundle branch block.  Anteroseptal infarct old.  Nonspecific ST-T abnormality, cannot exclude high lateral ischemia.  Compared to 06/01/2019, normal sinus rhythm with rate of 98 bpm with normal axis without ischemia.  Echocardiogram: 08/2017: LVEF 54%, mild concentric LVH, grade 1 diastolic impairment, mild TR, no pulmonary hypertension.  08/10/2019: LVEF 40-08% grade 2 diastolic impairment, severe hypokinesis of the anterior septal wall.  No significant valvular abnormality  Stress test: Lexiscan myoview stress test 04/13/2016: 1. The resting electrocardiogram demonstrated normal sinus rhythm, normal resting conduction, no resting arrhythmias and normal rest repolarization. Stress EKG is non-diagnostic for ischemia as it a pharmacologic stress using Lexiscan. Stress symptoms included dyspnea and chest discomfort. Hypertensive at rest and stress with peak BP 192/110 mm Hg. 2. Perfusion imaging study demonstrates soft  tissue attenuation artifact in the inferior wall. There is no demonstrable ischemia or scar. LV systolic function was normal at 50% but visually appears to be more than 50%. This is a low risk study. Compared to the study 10/24/2012, small sized inferior ischemia is no longer present. This represents a low risk study.  Heart catheterization: 08/10/2019: LM: Normal LAD: Ostial 95% stenosis Ramus: Ostial/prox 30% stenosis LCx: Ostial 75% stenosis RCA: Prox-mid diffuse 40% calcific disease. Distal RCA 70% ISR LVEDP 17 mmHg  Scheduled Meds: . aspirin EC  81 mg Oral Daily  . atorvastatin  20 mg Oral QPM  . cloNIDine  0.2 mg Oral Daily  . insulin aspart  0-15 Units Subcutaneous TID WC  . metoprolol succinate  200 mg Oral Daily  .  morphine injection  2 mg Intravenous Once  . sodium chloride flush  3 mL Intravenous  Q12H  . tamsulosin  0.4 mg Oral QPC breakfast  . trospium  20 mg Oral BID  . verapamil  240 mg Oral Daily    Continuous Infusions: . sodium chloride 150 mL/hr at 08/11/19 0538  . sodium chloride    . heparin 1,100 Units/hr (08/11/19 0105)  . nitroGLYCERIN 2.5 mcg/min (08/11/19 0717)    PRN Meds: sodium chloride, acetaminophen, ALPRAZolam, cromolyn, nitroGLYCERIN, ondansetron (ZOFRAN) IV, sodium chloride flush   IMPRESSION & RECOMMENDATIONS: NAS WAFER is a 74 y.o. male whose past medical history and cardiac risk factors include: Multivessel CAD with angina pectoris, hypertension, hyperlipidemia, newly diagnosed cardiomyopathy, NSTEMI, acute/newly diagnosed segmental right lower lobe pulmonary embolism, advanced age, obesity.  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  He underwent left heart catheterization yesterday and was found to have multivessel coronary artery disease.  Echocardiogram shows newly diagnosed ischemic cardiomyopathy.  In the setting of diabetes mellitus and multivessel coronary artery disease cardiothoracic consultation was requested.  This morning patient had active chest discomfort consistent with angina pectoris.  The pain resolved with 2 pills of sublingual nitroglycerin tablets 5 minutes apart.  Was started on low-dose nitro drip for blood pressure management  and chest pain.  Awaiting cardiothoracic recommendations  Continue IV heparin drip  Most recent lipid profile reviewed.  Most recent hemoglobin A1c level reviewed  Morning BMP pending  Case was discussed with the patient's night nurse.  Patient's questions and concerns were addressed to his satisfaction. He voices understanding of the instructions provided during this encounter.   This note was created using a voice recognition software as a result there may be grammatical errors inadvertently enclosed that do not reflect the nature of this encounter. Every attempt is made to correct such errors.  Rex Kras, DO, [facility]. [place] Office: [phone] 08/11/2019, 7:23 AM

## 2019-08-11 NOTE — Progress Notes (Signed)
ANTICOAGULATION CONSULT NOTE - Follow Up Consult  Pharmacy Consult for Heparin Indication: chest pain/ACS/PE  No Known Allergies  Patient Measurements: Height: 5\' 9"  (175.3 cm) Weight: 96.6 kg (213 lb) IBW/kg (Calculated) : 70.7  Heparin dosing wt: 92 kg  Vital Signs: Temp: 98.4 F (36.9 C) (04/16 0328) Temp Source: Oral (04/16 0328) BP: 128/79 (04/16 0808) Pulse Rate: 91 (04/16 0744)  Labs: Recent Labs    08/09/19 1240 08/09/19 1240 08/09/19 1512 08/09/19 2306 08/09/19 2306 08/10/19 0631 08/10/19 0930 08/10/19 1334 08/11/19 0426 08/11/19 0759  HGB 13.0   < >  --   --   --  12.4*  --   --  12.1*  --   HCT 40.7  --   --   --   --  38.0*  --   --  37.1*  --   PLT 236  --   --   --   --  219  --   --  189  --   HEPARINUNFRC  --   --   --  0.86*   < >  --  0.64 0.61  --  0.41  CREATININE 1.84*  --   --  1.60*  --   --   --  1.42*  --   --   TROPONINIHS 3,309*  --  3,258*  --   --   --   --   --   --   --    < > = values in this interval not displayed.    Estimated Creatinine Clearance: 53.9 mL/min (A) (by C-G formula based on SCr of 1.42 mg/dL (H)).  Assessment: 74 year old male on heparin for chest pain. CT also found to have small PE. Patient s/p LHC awaiting cardiothoracic recommendations. Pharmacy consulted to resume heparin after cath. Heparin level this morning remains therapeutic. Hgb stable at 12.1, no issues with infusion or bleeding reported per RN.  Goal of Therapy:  Heparin level 0.3-0.7 units/ml Monitor platelets by anticoagulation protocol: Yes   Plan:  Continue heparin at 1100 units/hour Daily heparin level and CBC    Arnesha Schiraldi L. Devin Going, [place] PGY1 Pharmacy Resident [phone] 08/11/19      10:04 AM  Please check AMION for all [place] phone numbers After 10:00 PM, call the [place] [phone]

## 2019-08-11 NOTE — Progress Notes (Addendum)
Patient is free of chest pain, BP is stable 122/84, RR 19. Stopped Nitro drip at this time.

## 2019-08-11 NOTE — Progress Notes (Signed)
Pre-CABG Dopplers and lower extremity venous duplex completed.  Refer to "CV Proc" under chart review to view preliminary results.  08/11/2019 3:59 PM Kelby Aline., MHA, RVT, RDCS, RDMS

## 2019-08-11 NOTE — Consult Note (Addendum)
[address]       ,[place] [address]             [phone]        Christian [name] [facility] Medical Record #[id number] Date of Birth: May 16, 1945  Referring: Christian [name] Primary Care: Christian Koch, MD Primary Cardiologist: Einar Gip  Chief Complaint:    Chief Complaint  Patient presents with  . Chest Pain   History of Present Illness:      Christian [name] is a 74 yo AA male with known history CAD, HTN, Hyperlipidemia, and DM, and BPH.  His CAD dates back to 2015 at which time he underwent stent placement to his PDA.  In follow up with Dr. Einar Gip on 06/01/2019 he had complaints or shortness of breath and mild fatigue.  He denied chest pain at that time.  He was started on Spironolactone during that visit, but due to an elevation in his creatinine this had to be discontinued.  The patient developed complaints of chest pain.  His previous symptoms of shortness of breath and fatigue had progressed and he also developed lower extremity edema.  He presented to his PCP with above mentioned complaints.  She recommended the patient presented to the ED for evaluation.  On 4/14 work up in the ED consisted of CT scan which showed a right lower lobe pulmonary embolism.  EKG was obtained and showed non-specific ST-T wave changes.  Troponin levels were also positive.  He was ruled in for NSTEMI and Cardiology consult was requested for admission.  He was evaluated by Dr. Einar Gip who admitted patient for further care.  He was taken to the catheterization lab on 08/10/2019 and was found to have CAD.  It was felt coronary bypass grafting would be indicated and TCTS consult was requested.  Currently the patient is having episodic chest pain.  It has responded to NTG drip and SL NTG.  The patient had previously been fairly active until recently when his above mentioned symptoms have limited his activity level.  He is agreeable to proceed with surgery.  Current Activity/ Functional  Status: Patient is independent with mobility/ambulation, transfers, ADL's, IADL's.   Zubrod Score: At the time of surgery this patient's most appropriate activity status/level should be described as: []     0    Normal activity, no symptoms [x]     1    Restricted in physical strenuous activity but ambulatory, able to do out light work []     2    Ambulatory and capable of self care, unable to do work activities, up and about                 more than 50%  Of the time                            []     3    Only limited self care, in bed greater than 50% of waking hours []     4    Completely disabled, no self care, confined to bed or chair []     5    Moribund  Past Medical History:  Diagnosis Date  . BPH (benign prostatic hypertrophy)   . Coronary artery disease   . Diabetes mellitus without complication ([place])   . Dry eyes left  . Hiatal hernia   . Hyperlipidemia   . Hypertension   . Incomplete bladder emptying   .  Nocturia   . 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 SOB (shortness of breath) on exertion     Past Surgical History:  Procedure Laterality Date  . CARDIAC CATHETERIZATION    . CORONARY STENT PLACEMENT  02/27/2014   distal rt/pd coronary       dr Einar Gip  . CYSTO/ BLADDER BIOPSY'S/ CAUTHERIZATION  01-14-2004  DR Gaynelle Arabian  . LEFT HEART CATH AND CORONARY ANGIOGRAPHY N/A 08/10/2019   Procedure: LEFT HEART CATH AND CORONARY ANGIOGRAPHY;  Surgeon: Nigel Mormon, MD;  Location: [facility];  Service: Cardiovascular;  Laterality: N/A;  . LEFT HEART CATHETERIZATION WITH CORONARY ANGIOGRAM N/A 02/27/2014   Procedure: LEFT HEART CATHETERIZATION WITH CORONARY ANGIOGRAM;  Surgeon: Laverda Page, MD;  Location: [facility] CATH LAB;  Service: Cardiovascular;  Laterality: N/A;  . PERCUTANEOUS CORONARY STENT INTERVENTION (PCI-S)  02/27/2014   Procedure: PERCUTANEOUS CORONARY  STENT INTERVENTION (PCI-S);  Surgeon: Laverda Page, MD;  Location: [facility] CATH LAB;  Service: Cardiovascular;;  rt PDA  3.0/28mm Promus stent  . TRANSURETHRAL RESECTION OF PROSTATE  04/04/2012   Procedure: TRANSURETHRAL RESECTION OF THE PROSTATE WITH GYRUS INSTRUMENTS;  Surgeon: Ailene Rud, MD;  Location: [facility];  Service: Urology;  Laterality: N/A;  . TRANSURETHRAL RESECTION OF PROSTATE N/A 09/27/2014   Procedure: TRANSURETHRAL RESECTION OF THE PROSTATE ;  Surgeon: Carolan Clines, MD;  Location: [facility];  Service: Urology;  Laterality: N/A;  . UPPER GASTROINTESTINAL ENDOSCOPY      Social History   Tobacco Use  Smoking Status Former Smoker  . Packs/day: 0.50  . Years: 20.00  . Pack years: 10.00  . Types: Cigarettes  . Quit date: 03/28/1990  . Years since quitting: 29.3  Smokeless Tobacco Never Used    Social History   Substance and Sexual Activity  Alcohol Use Yes  . Alcohol/week: 0.0 standard drinks   Comment: 1 beer week rarely   No Known Allergies  Current Facility-Administered Medications  Medication Dose Route Frequency Provider Last Rate Last Admin  . 0.9 %  sodium chloride infusion   Intravenous Continuous Nigel Mormon, MD 150 mL/hr at 08/11/19 0538 New Bag at 08/11/19 0538  . 0.9 %  sodium chloride infusion  250 mL Intravenous PRN Christian [name], Manish J, MD      . acetaminophen (TYLENOL) tablet 650 mg  650 mg Oral Q4H PRN Christian [name], Manish J, MD      . ALPRAZolam Duanne Moron) tablet 0.25 mg  0.25 mg Oral BID PRN Adrian Prows, MD      . aspirin EC tablet 81 mg  81 mg Oral Daily Adrian Prows, MD   81 mg at 08/11/19 1004  . atorvastatin (LIPITOR) tablet 20 mg  20 mg Oral QPM Adrian Prows, MD   20 mg at 08/10/19 2149  . cloNIDine (CATAPRES) tablet 0.2 mg  0.2 mg Oral Daily Adrian Prows, MD   0.2 mg at 08/11/19 1004  . cromolyn (OPTICROM) 4 % ophthalmic solution 1 drop  1 drop Both Eyes QID PRN Adrian Prows, MD      . heparin ADULT infusion 100 units/mL  (25000 units/243mL sodium chloride 0.45%)  1,100 Units/hr Intravenous Continuous Einar Grad, RPH 11 mL/hr at 08/11/19 0105 1,100 Units/hr at 08/11/19 0105  . insulin aspart (novoLOG) injection 0-15 Units  0-15 Units Subcutaneous TID WC Adrian Prows, MD   3 Units at 08/10/19 [id number]  .  metoprolol succinate (TOPROL-XL) 24 hr tablet 200 mg  200 mg Oral Daily Adrian Prows, MD   200 mg at 08/11/19 1004  . morphine 2 MG/ML injection 2 mg  2 mg Intravenous Once Nuala Alpha A, PA-C      . nitroGLYCERIN (NITROSTAT) SL tablet 0.4 mg  0.4 mg Sublingual Q5 min PRN Adrian Prows, MD   0.4 mg at 08/11/19 0713  . nitroGLYCERIN 50 mg in dextrose 5 % 250 mL (0.2 mg/mL) infusion  0-30 mcg/min Intravenous Titrated Adrian Prows, MD   Stopped at 08/11/19 [phone]  . ondansetron (ZOFRAN) injection 4 mg  4 mg Intravenous Q6H PRN Christian [name], Manish J, MD      . sodium chloride flush (NS) 0.9 % injection 3 mL  3 mL Intravenous Q12H Christian [name], Manish J, MD   3 mL at 08/11/19 0104  . sodium chloride flush (NS) 0.9 % injection 3 mL  3 mL Intravenous PRN Christian [name], Manish J, MD      . tamsulosin (FLOMAX) capsule 0.4 mg  0.4 mg Oral QPC breakfast Adrian Prows, MD   0.4 mg at 08/11/19 1004  . trospium (SANCTURA) tablet 20 mg  20 mg Oral BID Adrian Prows, MD   20 mg at 08/11/19 1001  . verapamil (CALAN-SR) CR tablet 240 mg  240 mg Oral Daily Adrian Prows, MD   240 mg at 08/11/19 1004    Medications Prior to Admission  Medication Sig Dispense Refill Last Dose  . aspirin EC 81 MG tablet Take 81 mg by mouth daily.   08/09/2019 at Unknown time  . atorvastatin (LIPITOR) 20 MG tablet TAKE 1 TABLET BY MOUTH EVERY DAY IN THE EVENING (Patient taking differently: Take 20 mg by mouth every evening. ) 90 tablet 3 08/08/2019 at Unknown time  . cloNIDine (CATAPRES) 0.2 MG tablet TAKE 1 TABLET (0.2 MG TOTAL) BY MOUTH DAILY. 90 tablet 2 08/09/2019 at Unknown time  . cromolyn (OPTICROM) 4 % ophthalmic solution Place 1 drop into both eyes 4 (four) times  daily as needed (dry eyes).   12 UNK  . empagliflozin (JARDIANCE) 10 MG TABS tablet Take 10 mg by mouth daily before breakfast. 30 tablet 1 08/09/2019 at Unknown time  . furosemide (LASIX) 20 MG tablet TAKE 1 TABLET BY MOUTH EVERY DAY (Patient taking differently: Take 20 mg by mouth daily. ) 90 tablet 0 08/09/2019 at Unknown time  . isosorbide mononitrate (IMDUR) 60 MG 24 hr tablet Take 60 mg by mouth daily.   08/09/2019 at Unknown time  . metFORMIN (GLUCOPHAGE) 1000 MG tablet TAKE 1 TABLET TWICE A DAY WITH A MEAL (Patient taking differently: Take 1,000 mg by mouth 2 (two) times daily with a meal. ) 180 tablet 1 08/09/2019 at Unknown time  . metoprolol succinate (TOPROL-XL) 100 MG 24 hr tablet Take 1 tablet (100 mg total) by mouth daily. Take with or immediately following a meal. 90 tablet 3 08/09/2019 at 1030  . nitroGLYCERIN (NITROSTAT) 0.4 MG SL tablet Place 1 tablet (0.4 mg total) under the tongue every 5 (five) minutes as needed for chest pain. 25 tablet 1 UNK  . olmesartan-hydrochlorothiazide (BENICAR HCT) 40-25 MG tablet TAKE 1 TABLET BY MOUTH EVERY DAY (Patient taking differently: Take 1 tablet by mouth daily. ) 90 tablet 2 08/09/2019 at Unknown time  . PREVIDENT 5000 BOOSTER PLUS 1.1 % PSTE Take 1 application by mouth daily.    08/09/2019 at Unknown time  . silodosin (RAPAFLO) 8 MG CAPS capsule Take 8 mg by mouth at  bedtime.   08/08/2019 at Unknown time  . spironolactone (ALDACTONE) 25 MG tablet Take 1 tablet (25 mg total) by mouth daily. 30 tablet 2 08/09/2019 at Unknown time  . sucralfate (CARAFATE) 1 GM/10ML suspension Take 10 mLs (1 g total) by mouth 4 (four) times daily -  with meals and at bedtime. 420 mL 0 08/09/2019 at Unknown time  . tamsulosin (FLOMAX) 0.4 MG CAPS capsule Take 0.4 mg by mouth daily after breakfast.    08/09/2019 at Unknown time  . Trospium Chloride 60 MG CP24 Take 1 capsule by mouth daily.   08/09/2019 at Unknown time  . verapamil (VERELAN PM) 240 MG 24 hr capsule TAKE 1 CAPSULE  BY MOUTH EVERY DAY (Patient taking differently: Take 240 mg by mouth daily. ) 90 capsule 3 08/09/2019 at Unknown time    Family History  Problem Relation Age of Onset  . Diabetes Mother   . Hypertension Mother   . Diabetes Father   . Diabetes Brother   . Hypertension Brother   . Diabetes Brother    Review of Systems:   ROS   Cardiac Review of Systems: Y or  [    ]= no  Chest Pain [ Y   ]  Resting SOB [   ] Exertional SOB  [Y  ]  Orthopnea [  ]   Pedal Edema [ Y  ]    Palpitations [  ] Syncope  [  ]   Presyncope [   ]  General Review of Systems: [Y] = yes [  ]=no Constitional: recent weight change [  ]; anorexia [  ]; fatigue [ Y ]; nausea [  ]; night sweats [  ]; fever [  ]; or chills [  ]                                                               Dental: Last Dentist visit:   Eye : blurred vision [  ]; diplopia [   ]; vision changes [  ];  Amaurosis fugax[  ]; Resp: cough [  ];  wheezing[  ];  hemoptysis[  ]; shortness of breath[  ]; paroxysmal nocturnal dyspnea[  ]; dyspnea on exertion[Y  ]; or orthopnea[  ];  GI:  gallstones[  ], vomiting[  ];  dysphagia[Y  ]; melena[  ];  hematochezia [  ]; heartburn[  ];   Hx of  Colonoscopy[  ]; GU: kidney stones [  ]; hematuria[  ];   dysuria [  ];  nocturia[  ];  history of     obstruction [  ]; urinary frequency [  ]             Skin: rash, swelling[  ];, hair loss[  ];  peripheral edema[ Y ];  or itching[  ]; Musculosketetal: myalgias[  ];  joint swelling[  ];  joint erythema[  ];  joint pain[  ];  back pain[  ];  Heme/Lymph: bruising[  ];  bleeding[  ];  anemia[  ];  Neuro: TIA[ N ];  headaches[  ];  stroke[N ];  vertigo[  ];  seizures[  ];   paresthesias[  ];  difficulty walking[  ];  Psych:depression[  ]; anxiety[  ];  Endocrine: diabetes[ Y ];  thyroid dysfunction[N  ];  Physical Exam: BP 136/61 (BP Location: Left Arm)   Pulse 98   Temp 98.4 F (36.9 C) (Oral)   Resp 20   Ht 5\' 9"  (1.753 m)   Wt 96.6 kg   SpO2 96%   BMI 31.45  kg/m   General appearance: alert, cooperative and no distress Head: Normocephalic, without obvious abnormality, atraumatic Neck: no adenopathy, no carotid bruit, no JVD, supple, symmetrical, trachea midline and thyroid not enlarged, symmetric, no tenderness/mass/nodules Resp: diminished breath sounds bilaterally Cardio: regular rate and rhythm GI: soft, non-tender; bowel sounds normal; no masses,  no organomegaly Extremities: extremities normal, atraumatic, no cyanosis or edema Neurologic: Grossly normal  Diagnostic Studies & Laboratory data:     Recent Radiology Findings:   DG Chest 2 View  Result Date: 08/09/2019 CLINICAL DATA:  Chest pain. EXAM: CHEST - 2 VIEW COMPARISON:  July 24, 2019. FINDINGS: Stable cardiomegaly. No pneumothorax or pleural effusion is noted. Both lungs are clear. The visualized skeletal structures are unremarkable. IMPRESSION: No active cardiopulmonary disease. Electronically Signed   By: Marijo Conception M.D.   On: 08/09/2019 13:04   CT Angio Chest PE W and/or Wo Contrast  Result Date: 08/09/2019 CLINICAL DATA:  Shortness of breath. EXAM: CT ANGIOGRAPHY CHEST WITH CONTRAST TECHNIQUE: Multidetector CT imaging of the chest was performed using the standard protocol during bolus administration of intravenous contrast. Multiplanar CT image reconstructions and MIPs were obtained to evaluate the vascular anatomy. CONTRAST:  41mL OMNIPAQUE IOHEXOL 350 MG/ML SOLN COMPARISON:  CT abdomen 12/18/2003 FINDINGS: Cardiovascular: The heart is within normal limits in size for age. No pericardial effusion. Mild tortuosity, ectasia and calcification of the thoracic aorta but no focal aneurysm or dissection. The branch vessels are patent. Moderate calcification at the left subclavian artery origin. Fairly extensive three-vessel coronary artery calcifications are noted. The pulmonary arterial tree is fairly well opacified. Small filling defects are noted in a right lower lobe pulmonary  artery consistent with pulmonary embolism. Do not see any definite left-sided pulmonary emboli. Mediastinum/Nodes: No mediastinal or hilar mass or adenopathy. The esophagus is grossly normal. The thyroid gland is unremarkable. 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 I do not see any worrisome pulmonary lesions or pulmonary nodules. No acute pulmonary findings. No pleural effusion. Upper Abdomen: No significant upper abdominal findings. There are several bilateral adrenal gland nodules unchanged since 2005 and consistent with benign adenomas. Musculoskeletal: No significant bony findings. Review of the MIP images confirms the above findings. IMPRESSION: 1. Small right lower lobe pulmonary embolism. 2. Mild tortuosity, ectasia and calcification of the thoracic aorta but no focal aneurysm or dissection. 3. Extensive three-vessel coronary artery calcifications. 4. 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 5. Stable bilateral adrenal gland nodules consistent with benign adenomas. 6. Aortic atherosclerosis. Aortic Atherosclerosis (ICD10-I70.0). Aortic Atherosclerosis (ICD10-I70.0). Electronically Signed   By: Marijo Sanes M.D.   On: 08/09/2019 15:04   CARDIAC CATHETERIZATION  Result Date: 08/10/2019 LM: Normal LAD: Ostial 95% stenosis Ramus: Ostial/prox 30% stenosis LCx: Ostial 75% stenosis RCA: Prox-mid diffuse 40% calcific disease         Distal RCA 70% ISR LVEDP 17 mmHg  ECHOCARDIOGRAM COMPLETE  Result Date: 08/10/2019    ECHOCARDIOGRAM REPORT   Patient Name:   Christian [name] Date of Exam: 08/10/2019 Medical Rec #:  [id number]       Height:       69.0 in Accession #:  [id number]      Weight:       210.5 lb Date of Birth:  [birth year]      BSA:          2.111 m Patient Age:    70 years        BP:           130/75 mmHg Patient Gender: M               HR:           78 bpm. Exam Location:  Inpatient Procedure:  2D Echo Indications:    coronary artery disease. pulmonary embolus.  History:        Patient has no prior history of Echocardiogram examinations.                 Signs/Symptoms:Shortness of Breath.  Sonographer:    Johny Chess RDCS Referring Phys: [facility]  1. Left ventricular ejection fraction, by estimation, is 30 to 35%. The left ventricle has severely decreased function. The left ventricle demonstrates global hypokinesis. Left ventricular diastolic parameters are consistent with Grade II diastolic dysfunction (pseudonormalization). Elevated left ventricular end-diastolic pressure. There is severe hypokinesis of the left ventricular, entire anterior wall and septal wall.  2. Right ventricular systolic function is normal. The right ventricular size is normal. There is normal pulmonary artery systolic pressure. The estimated right ventricular systolic pressure is 74.1 mmHg.  3. The mitral valve is normal in structure. Trivial mitral valve regurgitation. No evidence of mitral stenosis.  4. The aortic valve is normal in structure. Aortic valve regurgitation is not visualized. Mild aortic valve sclerosis is present, with no evidence of aortic valve stenosis.  5. The inferior vena cava is dilated in size with >50% respiratory variability, suggesting right atrial pressure of 8 mmHg. FINDINGS  Left Ventricle: Left ventricular ejection fraction, by estimation, is 30 to 35%. The left ventricle has severely decreased function. The left ventricle demonstrates global hypokinesis. Severe hypokinesis of the left ventricular, entire anterior wall and  septal wall. The left ventricular internal cavity size was normal in size. There is no left ventricular hypertrophy. Left ventricular diastolic parameters are consistent with Grade II diastolic dysfunction (pseudonormalization). Elevated left ventricular end-diastolic pressure. Right Ventricle: The right ventricular size is normal. No increase in right  ventricular wall thickness. Right ventricular systolic function is normal. There is normal pulmonary artery systolic pressure. The tricuspid regurgitant velocity is 1.23 m/s, and  with an assumed right atrial pressure of 8 mmHg, the estimated right ventricular systolic pressure is 28.7 mmHg. Left Atrium: Left atrial size was normal in size. Right Atrium: Right atrial size was normal in size. Pericardium: There is no evidence of pericardial effusion. Mitral Valve: The mitral valve is normal in structure. Normal mobility of the mitral valve leaflets. Trivial mitral valve regurgitation. No evidence of mitral valve stenosis. Tricuspid Valve: The tricuspid valve is normal in structure. Tricuspid valve regurgitation is trivial. No evidence of tricuspid stenosis. Aortic Valve: The aortic valve is normal in structure. Aortic valve regurgitation is not visualized. Mild aortic valve sclerosis is present, with no evidence of aortic valve stenosis. Pulmonic Valve: The pulmonic valve was not well visualized. Pulmonic valve regurgitation is trivial. Aorta: The aortic root is normal in size and structure. Venous: The inferior vena cava is dilated in size with greater than 50% respiratory variability, suggesting right atrial pressure of 8 mmHg. IAS/Shunts: The interatrial septum was not assessed.  LEFT VENTRICLE PLAX 2D  LVIDd:         5.30 cm  Diastology LVIDs:         4.30 cm  LV e' lateral:   6.13 cm/s LV PW:         1.00 cm  LV E/e' lateral: 11.3 LV IVS:        1.10 cm  LV e' medial:    4.38 cm/s LVOT diam:     2.00 cm  LV E/e' medial:  15.8 LV SV:         49 LV SV Index:   23 LVOT Area:     3.14 cm  RIGHT VENTRICLE RV S prime:     8.85 cm/s TAPSE (M-mode): 1.6 cm LEFT ATRIUM             Index LA diam:        2.90 cm 1.37 cm/m LA Vol (A2C):   34.4 ml 16.29 ml/m LA Vol (A4C):   33.6 ml 15.91 ml/m LA Biplane Vol: 35.8 ml 16.96 ml/m  AORTIC VALVE LVOT Vmax:   82.90 cm/s LVOT Vmean:  54.600 cm/s LVOT VTI:    0.157 m  AORTA Ao  Root diam: 2.90 cm MITRAL VALVE               TRICUSPID VALVE MV Area (PHT): 4.39 cm    TR Peak grad:   6.1 mmHg MV Decel Time: 173 msec    TR Vmax:        123.00 cm/s MV E velocity: 69.40 cm/s MV A velocity: 79.70 cm/s  SHUNTS MV E/A ratio:  0.87        Systemic VTI:  0.16 m                            Systemic Diam: 2.00 cm Adrian Prows MD Electronically signed by Adrian Prows MD Signature Date/Time: 08/10/2019/1:42:14 PM    Final      I have independently reviewed the above radiologic studies and discussed with the patient   Recent Lab Findings: Lab Results  Component Value Date   WBC 6.2 08/11/2019   HGB 12.1 (L) 08/11/2019   HCT 37.1 (L) 08/11/2019   PLT 189 08/11/2019   GLUCOSE 128 (H) 08/10/2019   CHOL 107 08/11/2019   TRIG 125 08/11/2019   HDL 42 08/11/2019   LDLDIRECT 40.0 11/23/2018   LDLCALC 40 08/11/2019   ALT 64 (H) 08/09/2019   AST 106 (H) 08/09/2019   NA 141 08/10/2019   K 4.2 08/10/2019   CL 107 08/10/2019   CREATININE 1.42 (H) 08/10/2019   BUN 20 08/10/2019   CO2 23 08/10/2019   TSH 2.590 04/19/2019   INR 1.09 09/29/2014   HGBA1C 7.7 (H) 07/24/2019    Assessment / Plan:      1. CV- CAD, requesting CABG consult... continues to have intermittent chest pain which responds well to NTG 2. Right Lower Lobe PE- on Heparin 3. DM- newly diagnosed 4. HTN 5. Hyperlipidemia 6. Dispo- patient with CAD, intermittent chest pain responds to NTG, RLL PE on heparin.Marland Kitchen tentatively plan for CABG on Tuesday, Dr. Orvan Seen will follow up with further recommendations  I  spent 55 minutes counseling the patient face to face.   Erin Barrett, PA-C 08/11/2019 10:33 AM   Pt seen and examined; chart and films reviewed. Agree with notation as written. Plan CABG on 08/15/19. Ayat Drenning Z. Orvan Seen, [place]

## 2019-08-11 NOTE — Progress Notes (Signed)
Pt complaining of chest pain. Pain level 8/10 per pt.  RN administered [employer].  Chest pain was relieved and BP stable during this time.  RN will obtain EKG and place results in chart.

## 2019-08-12 LAB — PULMONARY FUNCTION TEST
FEF 25-75 Pre: 1.53 L/sec
FEF2575-%Pred-Pre: 66 %
FEV1-%Pred-Pre: 54 %
FEV1-Pre: 1.48 L
FEV1FVC-%Pred-Pre: 106 %
FEV6-%Pred-Pre: 52 %
FEV6-Pre: 1.81 L
FEV6FVC-%Pred-Pre: 103 %
FVC-%Pred-Pre: 50 %
FVC-Pre: 1.83 L
Pre FEV1/FVC ratio: 81 %
Pre FEV6/FVC Ratio: 99 %

## 2019-08-12 LAB — GLUCOSE, CAPILLARY
Glucose-Capillary: 123 mg/dL — ABNORMAL HIGH (ref 70–99)
Glucose-Capillary: 135 mg/dL — ABNORMAL HIGH (ref 70–99)
Glucose-Capillary: 101 mg/dL — ABNORMAL HIGH (ref 70–99)
Glucose-Capillary: 161 mg/dL — ABNORMAL HIGH (ref 70–99)

## 2019-08-12 LAB — CBC
HCT: 37.2 % — ABNORMAL LOW (ref 39.0–52.0)
Hemoglobin: 12 g/dL — ABNORMAL LOW (ref 13.0–17.0)
MCH: 32.9 pg (ref 26.0–34.0)
MCHC: 32.3 g/dL (ref 30.0–36.0)
MCV: 101.9 fL — ABNORMAL HIGH (ref 80.0–100.0)
Platelets: 207 10*3/uL (ref 150–400)
RBC: 3.65 MIL/uL — ABNORMAL LOW (ref 4.22–5.81)
RDW: 12.8 % (ref 11.5–15.5)
WBC: 8.3 10*3/uL (ref 4.0–10.5)
nRBC: 0 % (ref 0.0–0.2)

## 2019-08-12 LAB — HEPARIN LEVEL (UNFRACTIONATED): Heparin Unfractionated: 0.53 IU/mL (ref 0.30–0.70)

## 2019-08-12 NOTE — Progress Notes (Addendum)
Progress Note  Patient Name: Christian [name] Date of Encounter: 08/12/2019  Attending physician: Adrian Prows, MD Primary care provider: Hoyt Koch, MD Primary Cardiologist: Dr. Adrian Prows  Subjective: Christian [name] is a 74 y.o. male  presented to the hospital with a chief complaint of " chest pain."  Patient seen and examined at bedside at approximately 10:20AM Patient had one episode of chest discomfort early today which resolved with one subling nitro. Not on nitro gtt.   Patient's chest pain has resolved and is currently also on heparin drip.  Patient has been evaluated by CT surgery and plans to undergo coronary artery bypass grafting surgery in the upcoming week.  Case discussed and reviewed with his nurse.  Objective: Vital Signs in the last 24 hours: Temp:  [97.5 F (36.4 C)-98.7 F (37.1 C)] 98.6 F (37 C) (04/17 0745) Pulse Rate:  [73-93] 93 (04/17 0400) Resp:  [18-24] 24 (04/17 0400) BP: (114-168)/(76-107) 168/107 (04/17 0400) SpO2:  [95 %-98 %] 96 % (04/17 0745) Weight:  [99.1 kg] 99.1 kg (04/17 0533)  Intake/Output from previous day: 04/16 0701 - 04/17 0700 In: 6156.3 [P.O.:2280; I.V.:3876.3] Out: 3275 [Urine:3275]  Weights:  Filed Weights   08/10/19 0336 08/11/19 0128 08/12/19 0533  Weight: 95.5 kg 96.6 kg 99.1 kg    Telemetry: Personally reviewed.  Review of Systems  Constitution: Negative for chills and fever.  HENT: Negative for ear discharge, ear pain and nosebleeds.   Eyes: Negative for blurred vision and discharge.  Cardiovascular: Positive for chest pain (not at time of evaluation, but earlier this morning. ) and leg swelling. Negative for claudication, dyspnea on exertion, near-syncope, orthopnea, palpitations, paroxysmal nocturnal dyspnea and syncope.  Respiratory: Negative for cough and shortness of breath.   Endocrine: Negative for polydipsia, polyphagia and polyuria.  Hematologic/Lymphatic: Negative for bleeding problem.  Skin:  Negative for flushing and nail changes.  Musculoskeletal: Negative for muscle cramps, muscle weakness and myalgias.  Gastrointestinal: Negative for abdominal pain, dysphagia, hematemesis, hematochezia, melena, nausea and vomiting.  Neurological: Negative for dizziness, focal weakness and light-headedness.    Physical examination: GEN:  Age-appropriate male, hemodynamically stable, no distress Neck: -JVP, supple.  Cardiac: RRR, no murmurs, rubs, or gallops.  Respiratory: Clear to auscultation bilaterally. GI: Soft, nontender, non-distended  Extremity:  2+ bilateral edema; No deformity. Neuro:  Nonfocal  Psych: Normal affect   Lab Results: Hematology Recent Labs  Lab 08/10/19 0631 08/11/19 0426 08/12/19 0300  WBC 8.1 6.2 8.3  RBC 3.68* 3.61* 3.65*  HGB 12.4* 12.1* 12.0*  HCT 38.0* 37.1* 37.2*  MCV 103.3* 102.8* 101.9*  MCH 33.7 33.5 32.9  MCHC 32.6 32.6 32.3  RDW 13.2 13.1 12.8  PLT 219 189 207    Chemistry Recent Labs  Lab 08/09/19 0922 08/09/19 0922 08/09/19 1240 08/09/19 2306 08/10/19 1334  NA 142   < > 139 138 141  K 4.4   < > 4.0 5.4* 4.2  CL 103   < > 104 106 107  CO2 27   < > 21* 19* 23  GLUCOSE 121*   < > 148* 152* 128*  BUN 38*   < > 34* 29* 20  CREATININE 1.85*   < > 1.84* 1.60* 1.42*  CALCIUM 10.1   < > 9.7 8.8* 8.8*  PROT 7.0  --   --  6.1*  --   ALBUMIN 4.4  --   --  3.6  --   AST 57*  --   --  106*  --  ALT 62*  --   --  64*  --   ALKPHOS 93  --   --  79  --   BILITOT 0.3  --   --  1.2  --   GFRNONAA  --   --  36* 42* 49*  GFRAA  --   --  42* 49* 57*  ANIONGAP  --   --  14 13 11    < > = values in this interval not displayed.     Hepatic Function Panel  Recent Labs    07/24/19 1512 08/09/19 0922 08/09/19 2306  PROT 6.8 7.0 6.1*  ALBUMIN 4.4 4.4 3.6  AST 60* 57* 106*  ALT 78* 62* 64*  ALKPHOS 93 93 79  BILITOT 0.3 0.3 1.2    Cardiac Enzymes: Recent Labs    07/04/19 1133  CKTOTAL 56   Lab Results  Component Value Date    CKTOTAL 56 07/04/2019     BNP Recent Labs  Lab 08/09/19 0922 08/09/19 1240  BNP  --  89.1  PROBNP 78.0  --      DDimer No results for input(s): DDIMER in the last 168 hours.   Hemoglobin A1c:  Lab Results  Component Value Date   HGBA1C 7.7 (H) 07/24/2019   TSH  Recent Labs    04/19/19 0911  TSH 2.590    Lipid Panel     Component Value Date/Time   CHOL 107 08/11/2019 0426   TRIG 125 08/11/2019 0426   HDL 42 08/11/2019 0426   CHOLHDL 2.5 08/11/2019 0426   VLDL 25 08/11/2019 0426   LDLCALC 40 08/11/2019 0426   LDLDIRECT 40.0 11/23/2018 1120    Imaging: CARDIAC CATHETERIZATION  Result Date: 08/10/2019 LM: Normal LAD: Ostial 95% stenosis Ramus: Ostial/prox 30% stenosis LCx: Ostial 75% stenosis RCA: Prox-mid diffuse 40% calcific disease         Distal RCA 70% ISR LVEDP 17 mmHg  ECHOCARDIOGRAM COMPLETE  Result Date: 08/10/2019    ECHOCARDIOGRAM REPORT   Patient Name:   Christian [name] Date of Exam: 08/10/2019 Medical Rec #:  [id number]       Height:       69.0 in Accession #:    [id number]      Weight:       210.5 lb Date of Birth:  1945-06-06      BSA:          2.111 m Patient Age:    58 years        BP:           130/75 mmHg Patient Gender: M               HR:           78 bpm. Exam Location:  Inpatient Procedure: 2D Echo Indications:    coronary artery disease. pulmonary embolus.  History:        Patient has no prior history of Echocardiogram examinations.                 Signs/Symptoms:Shortness of Breath.  Sonographer:    Johny Chess RDCS Referring Phys: [place]  1. Left ventricular ejection fraction, by estimation, is 30 to 35%. The left ventricle has severely decreased function. The left ventricle demonstrates global hypokinesis. Left ventricular diastolic parameters are consistent with Grade II diastolic dysfunction (pseudonormalization). Elevated left ventricular end-diastolic pressure. There is severe hypokinesis of the left ventricular, entire  anterior wall and septal wall.  2. Right  ventricular systolic function is normal. The right ventricular size is normal. There is normal pulmonary artery systolic pressure. The estimated right ventricular systolic pressure is 78.2 mmHg.  3. The mitral valve is normal in structure. Trivial mitral valve regurgitation. No evidence of mitral stenosis.  4. The aortic valve is normal in structure. Aortic valve regurgitation is not visualized. Mild aortic valve sclerosis is present, with no evidence of aortic valve stenosis.  5. The inferior vena cava is dilated in size with >50% respiratory variability, suggesting right atrial pressure of 8 mmHg. FINDINGS  Left Ventricle: Left ventricular ejection fraction, by estimation, is 30 to 35%. The left ventricle has severely decreased function. The left ventricle demonstrates global hypokinesis. Severe hypokinesis of the left ventricular, entire anterior wall and  septal wall. The left ventricular internal cavity size was normal in size. There is no left ventricular hypertrophy. Left ventricular diastolic parameters are consistent with Grade II diastolic dysfunction (pseudonormalization). Elevated left ventricular end-diastolic pressure. Right Ventricle: The right ventricular size is normal. No increase in right ventricular wall thickness. Right ventricular systolic function is normal. There is normal pulmonary artery systolic pressure. The tricuspid regurgitant velocity is 1.23 m/s, and  with an assumed right atrial pressure of 8 mmHg, the estimated right ventricular systolic pressure is 42.3 mmHg. Left Atrium: Left atrial size was normal in size. Right Atrium: Right atrial size was normal in size. Pericardium: There is no evidence of pericardial effusion. Mitral Valve: The mitral valve is normal in structure. Normal mobility of the mitral valve leaflets. Trivial mitral valve regurgitation. No evidence of mitral valve stenosis. Tricuspid Valve: The tricuspid valve is normal in  structure. Tricuspid valve regurgitation is trivial. No evidence of tricuspid stenosis. Aortic Valve: The aortic valve is normal in structure. Aortic valve regurgitation is not visualized. Mild aortic valve sclerosis is present, with no evidence of aortic valve stenosis. Pulmonic Valve: The pulmonic valve was not well visualized. Pulmonic valve regurgitation is trivial. Aorta: The aortic root is normal in size and structure. Venous: The inferior vena cava is dilated in size with greater than 50% respiratory variability, suggesting right atrial pressure of 8 mmHg. IAS/Shunts: The interatrial septum was not assessed.  LEFT VENTRICLE PLAX 2D LVIDd:         5.30 cm  Diastology LVIDs:         4.30 cm  LV e' lateral:   6.13 cm/s LV PW:         1.00 cm  LV E/e' lateral: 11.3 LV IVS:        1.10 cm  LV e' medial:    4.38 cm/s LVOT diam:     2.00 cm  LV E/e' medial:  15.8 LV SV:         49 LV SV Index:   23 LVOT Area:     3.14 cm  RIGHT VENTRICLE RV S prime:     8.85 cm/s TAPSE (M-mode): 1.6 cm LEFT ATRIUM             Index LA diam:        2.90 cm 1.37 cm/m LA Vol (A2C):   34.4 ml 16.29 ml/m LA Vol (A4C):   33.6 ml 15.91 ml/m LA Biplane Vol: 35.8 ml 16.96 ml/m  AORTIC VALVE LVOT Vmax:   82.90 cm/s LVOT Vmean:  54.600 cm/s LVOT VTI:    0.157 m  AORTA Ao Root diam: 2.90 cm MITRAL VALVE  TRICUSPID VALVE MV Area (PHT): 4.39 cm    TR Peak grad:   6.1 mmHg MV Decel Time: 173 msec    TR Vmax:        123.00 cm/s MV E velocity: 69.40 cm/s MV A velocity: 79.70 cm/s  SHUNTS MV E/A ratio:  0.87        Systemic VTI:  0.16 m                            Systemic Diam: 2.00 cm Adrian Prows MD Electronically signed by Adrian Prows MD Signature Date/Time: 08/10/2019/1:42:14 PM    Final    VAS US DOPPLER PRE CABG  Result Date: 08/11/2019 PREOPERATIVE VASCULAR EVALUATION  Comparison Study: No prior study Performing Technologist: Maudry Mayhew MHA, RDMS, RVT, RDCS  Examination Guidelines: A complete evaluation includes B-mode  imaging, spectral Doppler, color Doppler, and power Doppler as needed of all accessible portions of each vessel. Bilateral testing is considered an integral part of a complete examination. Limited examinations for reoccurring indications may be performed as noted.  Right Carotid Findings: +----------+--------+--------+--------+-----------------------+--------+           PSV cm/sEDV cm/sStenosisDescribe               Comments +----------+--------+--------+--------+-----------------------+--------+ CCA Prox  122     19                                              +----------+--------+--------+--------+-----------------------+--------+ CCA Distal62      12                                              +----------+--------+--------+--------+-----------------------+--------+ ICA Prox  48      15              smooth and heterogenous         +----------+--------+--------+--------+-----------------------+--------+ ICA Distal61      18                                              +----------+--------+--------+--------+-----------------------+--------+ ECA       123     8                                               +----------+--------+--------+--------+-----------------------+--------+ Portions of this table do not appear on this page. +----------+--------+-------+----------------+------------+           PSV cm/sEDV cmsDescribe        Arm Pressure +----------+--------+-------+----------------+------------+ Subclavian58             Multiphasic, WNL             +----------+--------+-------+----------------+------------+ +---------+--------+--------+--------------+ VertebralPSV cm/sEDV cm/sNot identified +---------+--------+--------+--------------+ Left Carotid Findings: +----------+--------+--------+--------+-------------------+--------+           PSV cm/sEDV cm/sStenosisDescribe           Comments  +----------+--------+--------+--------+-------------------+--------+ CCA Prox  119     27                                          +----------+--------+--------+--------+-------------------+--------+  CCA Distal86      24                                          +----------+--------+--------+--------+-------------------+--------+ ICA Prox  70      12              smooth and calcific         +----------+--------+--------+--------+-------------------+--------+ ICA Distal49      17                                          +----------+--------+--------+--------+-------------------+--------+ ECA       98      13                                          +----------+--------+--------+--------+-------------------+--------+ +----------+--------+--------+----------------+------------+ SubclavianPSV cm/sEDV cm/sDescribe        Arm Pressure +----------+--------+--------+----------------+------------+           165             Multiphasic, [id number]          +----------+--------+--------+----------------+------------+ +---------+--------+--+--------+--+---------+ VertebralPSV cm/s47EDV cm/s11Antegrade +---------+--------+--+--------+--+---------+  ABI Findings: +--------+------------------+-----+---------+----------------------------------+ Right   Rt Pressure (mmHg)IndexWaveform Comment                            +--------+------------------+-----+---------+----------------------------------+ Brachial                       triphasicUnable to evaluate pressure due to                                         IV location                        +--------+------------------+-----+---------+----------------------------------+ PTA                            triphasic                                   +--------+------------------+-----+---------+----------------------------------+ DP                             triphasic                                    +--------+------------------+-----+---------+----------------------------------+ +--------+------------------+-----+---------+-------+ Left    Lt Pressure (mmHg)IndexWaveform Comment +--------+------------------+-----+---------+-------+ [id number]                    triphasic        +--------+------------------+-----+---------+-------+ PTA                            triphasic        +--------+------------------+-----+---------+-------+ DP  triphasic        +--------+------------------+-----+---------+-------+  Right Doppler Findings: +--------+--------+-----+---------+-------------------------------------------+ Site    PressureIndexDoppler  Comments                                    +--------+--------+-----+---------+-------------------------------------------+ Brachial             triphasicUnable to evaluate pressure due to IV                                     location                                    +--------+--------+-----+---------+-------------------------------------------+ Radial               triphasic                                            +--------+--------+-----+---------+-------------------------------------------+ Ulnar                triphasic                                            +--------+--------+-----+---------+-------------------------------------------+  Left Doppler Findings: +--------+--------+-----+---------+--------+ Site    PressureIndexDoppler  Comments +--------+--------+-----+---------+--------+ [id number]          triphasic         +--------+--------+-----+---------+--------+ Radial               triphasic         +--------+--------+-----+---------+--------+ Ulnar                triphasic         +--------+--------+-----+---------+--------+  Summary: Right Carotid: Velocities in the right ICA are consistent with a 1-39% stenosis. Left Carotid: Velocities in the left  ICA are consistent with a 1-39% stenosis. Vertebrals:  Left vertebral artery demonstrates antegrade flow. Right vertebral              artery was not visualized. Subclavians: Normal flow hemodynamics were seen in bilateral subclavian              arteries. Right Upper Extremity: Doppler waveforms remain within normal limits with right radial compression. Doppler waveforms decrease >50% with right ulnar compression. Left Upper Extremity: Doppler waveforms remain within normal limits with left radial compression. Doppler waveforms remain within normal limits with left ulnar compression.  Electronically signed by Monica Martinez MD on 08/11/2019 at 4:05:02 PM.    Final    VAS [place] LOWER EXTREMITY VENOUS (DVT)  Result Date: 08/11/2019  Lower Venous DVTStudy Indications: Pulmonary embolism.  Comparison Study: No prior study Performing Technologist: Maudry Mayhew MHA, RDMS, RVT, RDCS  Examination Guidelines: A complete evaluation includes B-mode imaging, spectral Doppler, color Doppler, and power Doppler as needed of all accessible portions of each vessel. Bilateral testing is considered an integral part of a complete examination. Limited examinations for reoccurring indications may be performed as noted. The reflux portion of the exam is performed with the patient in reverse Trendelenburg.  +---------+---------------+---------+-----------+----------+--------------+ RIGHT    CompressibilityPhasicitySpontaneityPropertiesThrombus Aging +---------+---------------+---------+-----------+----------+--------------+ CFV  Full           Yes      Yes                                 +---------+---------------+---------+-----------+----------+--------------+ SFJ      Full                                                        +---------+---------------+---------+-----------+----------+--------------+ FV Prox  Full                                                         +---------+---------------+---------+-----------+----------+--------------+ FV Mid   Full                                                        +---------+---------------+---------+-----------+----------+--------------+ FV DistalFull                                                        +---------+---------------+---------+-----------+----------+--------------+ PFV      Full                                                        +---------+---------------+---------+-----------+----------+--------------+ POP      Full           Yes      Yes                                 +---------+---------------+---------+-----------+----------+--------------+ PTV      Full                                                        +---------+---------------+---------+-----------+----------+--------------+ PERO     Full                                                        +---------+---------------+---------+-----------+----------+--------------+   +---------+---------------+---------+-----------+----------+--------------+ LEFT     CompressibilityPhasicitySpontaneityPropertiesThrombus Aging +---------+---------------+---------+-----------+----------+--------------+ CFV      Full           Yes      Yes                                 +---------+---------------+---------+-----------+----------+--------------+  SFJ      Full                                                        +---------+---------------+---------+-----------+----------+--------------+ FV Prox  Full                                                        +---------+---------------+---------+-----------+----------+--------------+ FV Mid   Full                                                        +---------+---------------+---------+-----------+----------+--------------+ FV DistalFull                                                         +---------+---------------+---------+-----------+----------+--------------+ PFV      Full                                                        +---------+---------------+---------+-----------+----------+--------------+ POP      Full           Yes      Yes                                 +---------+---------------+---------+-----------+----------+--------------+ PTV      Full                                                        +---------+---------------+---------+-----------+----------+--------------+ PERO     Full                                                        +---------+---------------+---------+-----------+----------+--------------+     Summary: RIGHT: - There is no evidence of deep vein thrombosis in the lower extremity.  - No cystic structure found in the popliteal fossa.  LEFT: - There is no evidence of deep vein thrombosis in the lower extremity.  - No cystic structure found in the popliteal fossa.  *See table(s) above for measurements and observations. Electronically signed by Monica Martinez MD on 08/11/2019 at 4:05:38 PM.    Final     Cardiac database: EKG: EKG 08/09/2018: Sinus tachycardia at the rate of 120 bpm, leftward axis, incomplete right bundle branch block.  Anteroseptal infarct old.  Nonspecific ST-T abnormality, cannot exclude high  lateral ischemia.  Compared to 06/01/2019, normal sinus rhythm with rate of 98 bpm with normal axis without ischemia.  Echocardiogram: 08/2017: LVEF 54%, mild concentric LVH, grade 1 diastolic impairment, mild TR, no pulmonary hypertension.  08/10/2019: LVEF 16-01% grade 2 diastolic impairment, severe hypokinesis of the anterior septal wall.  No significant valvular abnormality  Stress test: Lexiscan myoview stress test 04/13/2016: 1. The resting electrocardiogram demonstrated normal sinus rhythm, normal resting conduction, no resting arrhythmias and normal rest repolarization. Stress EKG is non-diagnostic for  ischemia as it a pharmacologic stress using Lexiscan. Stress symptoms included dyspnea and chest discomfort. Hypertensive at rest and stress with peak BP 192/110 mm Hg. 2. Perfusion imaging study demonstrates soft tissue attenuation artifact in the inferior wall. There is no demonstrable ischemia or scar. LV systolic function was normal at 50% but visually appears to be more than 50%. This is a low risk study. Compared to the study 10/24/2012, small sized inferior ischemia is no longer present. This represents a low risk study.  Heart catheterization: 08/10/2019: LM: Normal LAD: Ostial 95% stenosis Ramus: Ostial/prox 30% stenosis LCx: Ostial 75% stenosis RCA: Prox-mid diffuse 40% calcific disease. Distal RCA 70% ISR LVEDP 17 mmHg  Scheduled Meds: . aspirin EC  81 mg Oral Daily  . atorvastatin  20 mg Oral QPM  . cloNIDine  0.2 mg Oral Daily  . insulin aspart  0-15 Units Subcutaneous TID WC  . metoprolol succinate  200 mg Oral Daily  . sodium chloride flush  3 mL Intravenous Q12H  . tamsulosin  0.4 mg Oral QPC breakfast  . trospium  20 mg Oral BID  . verapamil  240 mg Oral Daily    Continuous Infusions: . sodium chloride 150 mL/hr at 08/12/19 1115  . sodium chloride    . heparin 1,100 Units/hr (08/11/19 1854)  . nitroGLYCERIN Stopped (08/11/19 0748)    PRN Meds: sodium chloride, acetaminophen, ALPRAZolam, cromolyn, nitroGLYCERIN, ondansetron (ZOFRAN) IV, sodium chloride flush   IMPRESSION & RECOMMENDATIONS: Christian [name] is a 74 y.o. male whose past medical history and cardiac risk factors include: Multivessel CAD with angina pectoris, hypertension, hyperlipidemia, newly diagnosed cardiomyopathy, NSTEMI, acute/newly diagnosed segmental right lower lobe pulmonary embolism, advanced age, obesity.  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   Patient presented to the hospital with symptoms of typical angina and elevated cardiac biomarkers was ruled in for non-STEMI.  He underwent left heart catheterization yesterday and was found to have multivessel coronary artery disease.  Echocardiogram shows newly diagnosed ischemic cardiomyopathy.  In the setting of diabetes mellitus and multivessel coronary artery disease cardiothoracic consultation was requested and recommendations appreciated.  Patient is currently undergoing  pre-CABG work-up.  He continues to have angina after his which is usually resolved with sublingual nitroglycerin tablets.  Currently the nitro drip is turned off if needed.   Continue IV heparin drip  Most recent lipid profile reviewed.   Most recent hemoglobin A1c level reviewed  Recommend initiate incentive spirometry.  Case was discussed with the patient's night nurse.  Patient's questions and concerns were addressed to his satisfaction. He voices understanding of the instructions provided during this encounter.  This note was created using a voice recognition software as a result there may be grammatical errors inadvertently enclosed that do not reflect the nature of this encounter. Every attempt is made to correct such errors.  Christian Kras, DO, [facility]. [facility] Office: [phone] 08/12/2019, 11:45 AM    Addendum: Spoke to the patient's wife and updated her with his status and plans of care. She can be reached at [phone].  ST  12:49pm

## 2019-08-12 NOTE — Progress Notes (Signed)
Called to the room patient c/o chest pain 5/10, 1 nitro given patient pain relieved by 1 nitro. MD on call notified will continue to monitor patient.

## 2019-08-12 NOTE — Progress Notes (Signed)
ANTICOAGULATION CONSULT NOTE - Follow Up Consult  Pharmacy Consult for Heparin Indication: chest pain/ACS/PE  No Known Allergies  Patient Measurements: Height: 5\' 9"  (175.3 cm) Weight: 99.1 kg (218 lb 8 oz) IBW/kg (Calculated) : 70.7  Heparin dosing wt: 91kg  Vital Signs: Temp: 98.6 F (37 C) (04/17 0745) Temp Source: Oral (04/17 0745) BP: 168/107 (04/17 0400) Pulse Rate: 93 (04/17 0400)  Labs: Recent Labs    08/09/19 1240 08/09/19 1240 08/09/19 1512 08/09/19 2306 08/10/19 0631 08/10/19 0631 08/10/19 0930 08/10/19 1334 08/11/19 0426 08/11/19 0759 08/12/19 0300  HGB 13.0   < >  --   --  12.4*   < >  --   --  12.1*  --  12.0*  HCT 40.7   < >  --   --  38.0*  --   --   --  37.1*  --  37.2*  PLT 236   < >  --   --  219  --   --   --  189  --  207  HEPARINUNFRC  --   --   --  0.86*  --   --    < > 0.61  --  0.41 0.53  CREATININE 1.84*  --   --  1.60*  --   --   --  1.42*  --   --   --   TROPONINIHS 3,309*  --  3,258*  --   --   --   --   --   --   --   --    < > = values in this interval not displayed.    Estimated Creatinine Clearance: 54.6 mL/min (A) (by C-G formula based on SCr of 1.42 mg/dL (H)).  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 CT confirmed small PE. Dopplers negative for DVT. No anticoagulation PTA.   Heparin level this morning remains in therapeutic range at 0.53 on 1100 units/hour. H&H stable at 12/37.2, plts wnl at 207. No bleeding noted.   Goal of Therapy:  Heparin level 0.3-0.7 units/ml Monitor platelets by anticoagulation protocol: Yes   Plan:  Continue heparin at 1100 units/hour Daily heparin level and CBC Monitor for bleeding  Follow up plans for oral anticoagulation, TOC consult for Eliquis found co-pay to be $47.00 (days supply for this co-pay not mentioned)   Thank you,   Eddie Candle, PharmD PGY-1 Pharmacy Resident   Please check amion for clinical pharmacist  contact number

## 2019-08-13 ENCOUNTER — Inpatient Hospital Stay (HOSPITAL_COMMUNITY): Payer: Medicare Other

## 2019-08-13 LAB — TROPONIN I (HIGH SENSITIVITY): Troponin I (High Sensitivity): 1071 ng/L (ref <18)

## 2019-08-13 LAB — CBC
HCT: 37 % — ABNORMAL LOW (ref 39.0–52.0)
Hemoglobin: 11.9 g/dL — ABNORMAL LOW (ref 13.0–17.0)
MCH: 33.5 pg (ref 26.0–34.0)
MCHC: 32.2 g/dL (ref 30.0–36.0)
MCV: 104.2 fL — ABNORMAL HIGH (ref 80.0–100.0)
Platelets: 210 10*3/uL (ref 150–400)
RBC: 3.55 MIL/uL — ABNORMAL LOW (ref 4.22–5.81)
RDW: 13 % (ref 11.5–15.5)
WBC: 9.8 10*3/uL (ref 4.0–10.5)
nRBC: 0 % (ref 0.0–0.2)

## 2019-08-13 LAB — GLUCOSE, CAPILLARY
Glucose-Capillary: 101 mg/dL — ABNORMAL HIGH (ref 70–99)
Glucose-Capillary: 108 mg/dL — ABNORMAL HIGH (ref 70–99)
Glucose-Capillary: 107 mg/dL — ABNORMAL HIGH (ref 70–99)
Glucose-Capillary: 97 mg/dL (ref 70–99)

## 2019-08-13 LAB — BASIC METABOLIC PANEL
Anion gap: 6 (ref 5–15)
BUN: 8 mg/dL (ref 8–23)
CO2: 22 mmol/L (ref 22–32)
Calcium: 8.3 mg/dL — ABNORMAL LOW (ref 8.9–10.3)
Chloride: 113 mmol/L — ABNORMAL HIGH (ref 98–111)
Creatinine, Ser: 1.24 mg/dL (ref 0.61–1.24)
GFR calc Af Amer: >60 mL/min (ref >60)
GFR calc non Af Amer: 58 mL/min — ABNORMAL LOW (ref >60)
Glucose, Bld: 116 mg/dL — ABNORMAL HIGH (ref 70–99)
Potassium: 4.2 mmol/L (ref 3.5–5.1)
Sodium: 141 mmol/L (ref 135–145)

## 2019-08-13 LAB — HEPARIN LEVEL (UNFRACTIONATED)
Heparin Unfractionated: <0.1 IU/mL — ABNORMAL LOW (ref 0.30–0.70)
Heparin Unfractionated: <0.1 IU/mL — ABNORMAL LOW (ref 0.30–0.70)

## 2019-08-13 MED ORDER — POLYETHYLENE GLYCOL 3350 17 G PO PACK
17.0000 g | PACK | Freq: Every day | ORAL | Status: DC
  Administered 2019-08-13 – 2019-08-14 (×3): 17 g via ORAL
  Filled 2019-08-13 (×3): qty 1

## 2019-08-13 MED ORDER — ALUM & MAG HYDROXIDE-SIMETH 200-200-20 MG/5ML PO SUSP
30.0000 mL | ORAL | Status: DC | PRN
  Administered 2019-08-13: 30 mL via ORAL
  Filled 2019-08-13: qty 30

## 2019-08-13 MED ORDER — HEPARIN BOLUS VIA INFUSION
2700.0000 [IU] | Freq: Once | INTRAVENOUS | Status: AC
  Administered 2019-08-13: 2700 [IU] via INTRAVENOUS
  Filled 2019-08-13: qty 2700

## 2019-08-13 MED ORDER — BISACODYL 5 MG PO TBEC: 5.0000 mg | DELAYED_RELEASE_TABLET | Freq: Every day | ORAL | Status: DC | PRN

## 2019-08-13 MED ORDER — SIMETHICONE 80 MG PO CHEW: 80.0000 mg | CHEWABLE_TABLET | Freq: Four times a day (QID) | ORAL | Status: DC | PRN

## 2019-08-13 NOTE — Plan of Care (Signed)

## 2019-08-13 NOTE — Progress Notes (Signed)
RN restarted Nitro and Heparin gtt at this time.  RN notified pharmacy that Heparin gtt has been restarted.  Normal Saline, IV has been stopped, due to increased swelling to extremities and notified Dr. Radford Pax via phone page.

## 2019-08-13 NOTE — Progress Notes (Signed)
Pt complaining of right arm pain at IV site.  Right arm swollen.  Both IV removed during this time and arm elevated.  RN ordered IV consult and notified pharmacy that Heparin gtt has been stopped, during this time.  RN attempted to start a new IV, without success.  RN awaiting IV team.

## 2019-08-13 NOTE — Progress Notes (Signed)
RN found pt in room, having difficulty breathing and complaints of chest pain.  Pt was placed on 2-4L oxygen [place].  RN administered Nitro SL x 2 and started Nitroglycerin IV at 2.44mcq and titrated to 10 mcq.  Dr. Radford Pax notifed via phone page and Amion.  Lab work ordered and Miralax per verbal order from MD.  Pt verbalized, feeling better.

## 2019-08-13 NOTE — Progress Notes (Signed)
Patient has complaints of gas discomfort during the start of shift. Given maalox. Patient vomited 30 minutes later. He verbalizes he feel some relief but still has discomfort. MD paged and notified, order simethicone and has given IV Zofran for nausea and vomiting. Patient has not eaten any of meals - concern that he will vomit if he eats. Patient found relief with a bowel movement hours later. Patient blood glucose has been in the low 100's all shift, no coverage given. Patient is currently asleep, will continue to monitor.

## 2019-08-13 NOTE — Progress Notes (Signed)
Patient's wife and son is at the bedside. Provided update on patient's nausea episode and gas discomfort from earlier and plans towards CABG on Tuesday.

## 2019-08-13 NOTE — Progress Notes (Signed)
ANTICOAGULATION CONSULT NOTE - Follow Up Consult  Pharmacy Consult for Heparin Indication: chest pain/ACS/PE  No Known Allergies  Patient Measurements: Height: 5\' 9"  (175.3 cm) Weight: 100.9 kg (222 lb 7.1 oz) IBW/kg (Calculated) : 70.7  Heparin dosing wt: 91kg  Vital Signs: Temp: 98.3 F (36.8 C) (04/18 0748) Temp Source: Oral (04/18 0748) BP: 167/106 (04/18 0630) Pulse Rate: 101 (04/18 0630)  Labs: Recent Labs    08/10/19 1334 08/11/19 0426 08/11/19 0426 08/11/19 0759 08/12/19 0300 08/13/19 0242  HGB  --  12.1*   < >  --  12.0* 11.9*  HCT  --  37.1*  --   --  37.2* 37.0*  PLT  --  189  --   --  207 210  HEPARINUNFRC 0.61  --   --  0.41 0.53  --   CREATININE 1.42*  --   --   --   --  1.24  TROPONINIHS  --   --   --   --   --  1,071*   < > = values in this interval not displayed.    Estimated Creatinine Clearance: 63.1 mL/min (by C-G formula based on SCr of 1.24 mg/dL).  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 CT confirmed small PE. Dopplers negative for DVT. No anticoagulation PTA. Plans for CABG this week.   7.5 hour heparin level this afternoon is subtherapeutic at <0.1. Overnight around 0330 patient lost IV access and heparin was stopped. Once a new IV was placed, heparin was restarted around 0500 at previous rate of 1100 units/hour and heparin level re-timed. H&H is stable at 11.9/37, plts wnl at 210. Per RN, no infusion issues or bleeding.  Goal of Therapy:  Heparin level 0.3-0.7 units/ml Monitor platelets by anticoagulation protocol: Yes   Plan:  Bolus heparin with 2700 units Increase heparin to 1350 units/hour 8 hour heparin level Daily heparin level and CBC Monitor for bleeding  Follow up plans for oral anticoagulation, TOC consult for Eliquis found co-pay to be $47.00 (days supply for this co-pay not mentioned) Educate prior to discharge    Thank you,   Eddie Candle, PharmD PGY-1  Pharmacy Resident   Please check amion for clinical pharmacist contact number

## 2019-08-13 NOTE — Progress Notes (Signed)
Progress Note  Patient Name: Christian [name] Date of Encounter: 08/13/2019  Attending physician: Adrian Prows, MD Primary care provider: Hoyt Koch, MD Primary Cardiologist: Dr. Adrian Prows  Subjective: Christian [name] is a 74 y.o. male  presented to the hospital with a chief complaint of " chest pain."  Patient seen and examined at bedside at approximately 11AM Patient had an episode of chest discomfort since yesterday which resolved after 2 sublingual nitroglycerin tablets.   Since this morning patient states that he has been having nausea and had one episode of vomiting.  But no chest pain and abdominal pain.   Patient states that it has been several days since he had a bowel movement. No pain at the time of evaluation. He is currently on heparin and nitro drip.  Patient has been evaluated by CT surgery and plans to undergo coronary artery bypass grafting surgery in the upcoming week.  Case discussed and reviewed with his nurse.  Objective: Vital Signs in the last 24 hours: Temp:  [98.3 F (36.8 C)-98.8 F (37.1 C)] 98.4 F (36.9 C) (04/18 1158) Pulse Rate:  [55-106] 106 (04/18 1002) Resp:  [18-31] 20 (04/18 0630) BP: (102-178)/(76-109) 166/109 (04/18 1158) SpO2:  [95 %-100 %] 100 % (04/18 0630) Weight:  [100.9 kg] 100.9 kg (04/18 0514)  Intake/Output from previous day: 04/17 0701 - 04/18 0700 In: 3773.9 [P.O.:580; I.V.:3193.9] Out: 2575 [Urine:2575]  Weights:  Filed Weights   08/11/19 0128 08/12/19 0533 08/13/19 0514  Weight: 96.6 kg 99.1 kg 100.9 kg    Telemetry: Personally reviewed.  Review of Systems  Constitution: Negative for chills and fever.  HENT: Negative for ear discharge, ear pain and nosebleeds.   Eyes: Negative for blurred vision and discharge.  Cardiovascular: Positive for chest pain (not at time of evaluation, but earlier this morning. ) and leg swelling. Negative for claudication, dyspnea on exertion, near-syncope, orthopnea, palpitations,  paroxysmal nocturnal dyspnea and syncope.  Respiratory: Negative for cough and shortness of breath.   Endocrine: Negative for polydipsia, polyphagia and polyuria.  Hematologic/Lymphatic: Negative for bleeding problem.  Skin: Negative for flushing and nail changes.  Musculoskeletal: Negative for muscle cramps, muscle weakness and myalgias.  Gastrointestinal: Positive for constipation, nausea and vomiting. Negative for abdominal pain, dysphagia, hematemesis, hematochezia and melena.  Neurological: Negative for dizziness, focal weakness and light-headedness.    Physical examination: GEN:  Age-appropriate male, hemodynamically stable, no distress Neck: -JVP, supple.  Cardiac: RRR, no murmurs, rubs, or gallops.  Respiratory: Clear to auscultation bilaterally. GI: Soft, nontender, non-distended, positive bowel sounds in all 4 quadrants. Extremity:  1+ bilateral edema; No deformity. Neuro:  Nonfocal  Psych: Normal affect   Lab Results: Hematology Recent Labs  Lab 08/11/19 0426 08/12/19 0300 08/13/19 0242  WBC 6.2 8.3 9.8  RBC 3.61* 3.65* 3.55*  HGB 12.1* 12.0* 11.9*  HCT 37.1* 37.2* 37.0*  MCV 102.8* 101.9* 104.2*  MCH 33.5 32.9 33.5  MCHC 32.6 32.3 32.2  RDW 13.1 12.8 13.0  PLT 189 207 210    Chemistry Recent Labs  Lab 08/09/19 0922 08/09/19 1240 08/09/19 2306 08/10/19 1334 08/13/19 0242  NA 142   < > 138 141 141  K 4.4   < > 5.4* 4.2 4.2  CL 103   < > 106 107 113*  CO2 27   < > 19* 23 22  GLUCOSE 121*   < > 152* 128* 116*  BUN 38*   < > 29* 20 8  CREATININE 1.85*   < >  1.60* 1.42* 1.24  CALCIUM 10.1   < > 8.8* 8.8* 8.3*  PROT 7.0  --  6.1*  --   --   ALBUMIN 4.4  --  3.6  --   --   AST 57*  --  106*  --   --   ALT 62*  --  64*  --   --   ALKPHOS 93  --  79  --   --   BILITOT 0.3  --  1.2  --   --   GFRNONAA  --    < > 42* 49* 58*  GFRAA  --    < > 49* 57* >60  ANIONGAP  --    < > 13 11 6    < > = values in this interval not displayed.     Hepatic Function  Panel  Recent Labs    07/24/19 1512 08/09/19 0922 08/09/19 2306  PROT 6.8 7.0 6.1*  ALBUMIN 4.4 4.4 3.6  AST 60* 57* 106*  ALT 78* 62* 64*  ALKPHOS 93 93 79  BILITOT 0.3 0.3 1.2    Cardiac Enzymes: Recent Labs    07/04/19 1133  CKTOTAL 56   Lab Results  Component Value Date   CKTOTAL 56 07/04/2019     BNP Recent Labs  Lab 08/09/19 0922 08/09/19 1240  BNP  --  89.1  PROBNP 78.0  --      DDimer No results for input(s): DDIMER in the last 168 hours.   Hemoglobin A1c:  Lab Results  Component Value Date   HGBA1C 7.7 (H) 07/24/2019   TSH  Recent Labs    04/19/19 0911  TSH 2.590    Lipid Panel     Component Value Date/Time   CHOL 107 08/11/2019 0426   TRIG 125 08/11/2019 0426   HDL 42 08/11/2019 0426   CHOLHDL 2.5 08/11/2019 0426   VLDL 25 08/11/2019 0426   LDLCALC 40 08/11/2019 0426   LDLDIRECT 40.0 11/23/2018 1120    Imaging: VAS US DOPPLER PRE CABG  Result Date: 08/11/2019 PREOPERATIVE VASCULAR EVALUATION  Comparison Study: No prior study Performing Technologist: Maudry Mayhew MHA, RDMS, RVT, RDCS  Examination Guidelines: A complete evaluation includes B-mode imaging, spectral Doppler, color Doppler, and power Doppler as needed of all accessible portions of each vessel. Bilateral testing is considered an integral part of a complete examination. Limited examinations for reoccurring indications may be performed as noted.  Right Carotid Findings: +----------+--------+--------+--------+-----------------------+--------+           PSV cm/sEDV cm/sStenosisDescribe               Comments +----------+--------+--------+--------+-----------------------+--------+ CCA Prox  122     19                                              +----------+--------+--------+--------+-----------------------+--------+ CCA Distal62      12                                              +----------+--------+--------+--------+-----------------------+--------+ ICA  Prox  48      15              smooth and heterogenous         +----------+--------+--------+--------+-----------------------+--------+ ICA Distal61  18                                              +----------+--------+--------+--------+-----------------------+--------+ ECA       123     8                                               +----------+--------+--------+--------+-----------------------+--------+ Portions of this table do not appear on this page. +----------+--------+-------+----------------+------------+           PSV cm/sEDV cmsDescribe        Arm Pressure +----------+--------+-------+----------------+------------+ Subclavian58             Multiphasic, WNL             +----------+--------+-------+----------------+------------+ +---------+--------+--------+--------------+ VertebralPSV cm/sEDV cm/sNot identified +---------+--------+--------+--------------+ Left Carotid Findings: +----------+--------+--------+--------+-------------------+--------+           PSV cm/sEDV cm/sStenosisDescribe           Comments +----------+--------+--------+--------+-------------------+--------+ CCA Prox  119     27                                          +----------+--------+--------+--------+-------------------+--------+ CCA Distal86      24                                          +----------+--------+--------+--------+-------------------+--------+ ICA Prox  70      12              smooth and calcific         +----------+--------+--------+--------+-------------------+--------+ ICA Distal49      17                                          +----------+--------+--------+--------+-------------------+--------+ ECA       98      13                                          +----------+--------+--------+--------+-------------------+--------+ +----------+--------+--------+----------------+------------+ SubclavianPSV cm/sEDV cm/sDescribe         Arm Pressure +----------+--------+--------+----------------+------------+           165             Multiphasic, [id number]          +----------+--------+--------+----------------+------------+ +---------+--------+--+--------+--+---------+ VertebralPSV cm/s47EDV cm/s11Antegrade +---------+--------+--+--------+--+---------+  ABI Findings: +--------+------------------+-----+---------+----------------------------------+ Right   Rt Pressure (mmHg)IndexWaveform Comment                            +--------+------------------+-----+---------+----------------------------------+ Brachial                       triphasicUnable to evaluate pressure due to  IV location                        +--------+------------------+-----+---------+----------------------------------+ PTA                            triphasic                                   +--------+------------------+-----+---------+----------------------------------+ DP                             triphasic                                   +--------+------------------+-----+---------+----------------------------------+ +--------+------------------+-----+---------+-------+ Left    Lt Pressure (mmHg)IndexWaveform Comment +--------+------------------+-----+---------+-------+ [id number]                    triphasic        +--------+------------------+-----+---------+-------+ PTA                            triphasic        +--------+------------------+-----+---------+-------+ DP                             triphasic        +--------+------------------+-----+---------+-------+  Right Doppler Findings: +--------+--------+-----+---------+-------------------------------------------+ Site    PressureIndexDoppler  Comments                                    +--------+--------+-----+---------+-------------------------------------------+ Brachial              triphasicUnable to evaluate pressure due to IV                                     location                                    +--------+--------+-----+---------+-------------------------------------------+ Radial               triphasic                                            +--------+--------+-----+---------+-------------------------------------------+ Ulnar                triphasic                                            +--------+--------+-----+---------+-------------------------------------------+  Left Doppler Findings: +--------+--------+-----+---------+--------+ Site    PressureIndexDoppler  Comments +--------+--------+-----+---------+--------+ [id number]          triphasic         +--------+--------+-----+---------+--------+ Radial               triphasic         +--------+--------+-----+---------+--------+ Ulnar  triphasic         +--------+--------+-----+---------+--------+  Summary: Right Carotid: Velocities in the right ICA are consistent with a 1-39% stenosis. Left Carotid: Velocities in the left ICA are consistent with a 1-39% stenosis. Vertebrals:  Left vertebral artery demonstrates antegrade flow. Right vertebral              artery was not visualized. Subclavians: Normal flow hemodynamics were seen in bilateral subclavian              arteries. Right Upper Extremity: Doppler waveforms remain within normal limits with right radial compression. Doppler waveforms decrease >50% with right ulnar compression. Left Upper Extremity: Doppler waveforms remain within normal limits with left radial compression. Doppler waveforms remain within normal limits with left ulnar compression.  Electronically signed by Monica Martinez MD on 08/11/2019 at 4:05:02 PM.    Final    VAS [place] LOWER EXTREMITY VENOUS (DVT)  Result Date: 08/11/2019  Lower Venous DVTStudy Indications: Pulmonary embolism.  Comparison Study: No prior study Performing  Technologist: Maudry Mayhew MHA, RDMS, RVT, RDCS  Examination Guidelines: A complete evaluation includes B-mode imaging, spectral Doppler, color Doppler, and power Doppler as needed of all accessible portions of each vessel. Bilateral testing is considered an integral part of a complete examination. Limited examinations for reoccurring indications may be performed as noted. The reflux portion of the exam is performed with the patient in reverse Trendelenburg.  +---------+---------------+---------+-----------+----------+--------------+ RIGHT    CompressibilityPhasicitySpontaneityPropertiesThrombus Aging +---------+---------------+---------+-----------+----------+--------------+ CFV      Full           Yes      Yes                                 +---------+---------------+---------+-----------+----------+--------------+ SFJ      Full                                                        +---------+---------------+---------+-----------+----------+--------------+ FV Prox  Full                                                        +---------+---------------+---------+-----------+----------+--------------+ FV Mid   Full                                                        +---------+---------------+---------+-----------+----------+--------------+ FV DistalFull                                                        +---------+---------------+---------+-----------+----------+--------------+ PFV      Full                                                        +---------+---------------+---------+-----------+----------+--------------+  POP      Full           Yes      Yes                                 +---------+---------------+---------+-----------+----------+--------------+ PTV      Full                                                        +---------+---------------+---------+-----------+----------+--------------+ PERO     Full                                                         +---------+---------------+---------+-----------+----------+--------------+   +---------+---------------+---------+-----------+----------+--------------+ LEFT     CompressibilityPhasicitySpontaneityPropertiesThrombus Aging +---------+---------------+---------+-----------+----------+--------------+ CFV      Full           Yes      Yes                                 +---------+---------------+---------+-----------+----------+--------------+ SFJ      Full                                                        +---------+---------------+---------+-----------+----------+--------------+ FV Prox  Full                                                        +---------+---------------+---------+-----------+----------+--------------+ FV Mid   Full                                                        +---------+---------------+---------+-----------+----------+--------------+ FV DistalFull                                                        +---------+---------------+---------+-----------+----------+--------------+ PFV      Full                                                        +---------+---------------+---------+-----------+----------+--------------+ POP      Full           Yes      Yes                                 +---------+---------------+---------+-----------+----------+--------------+  PTV      Full                                                        +---------+---------------+---------+-----------+----------+--------------+ PERO     Full                                                        +---------+---------------+---------+-----------+----------+--------------+     Summary: RIGHT: - There is no evidence of deep vein thrombosis in the lower extremity.  - No cystic structure found in the popliteal fossa.  LEFT: - There is no evidence of deep vein thrombosis in the lower extremity.  - No cystic  structure found in the popliteal fossa.  *See table(s) above for measurements and observations. Electronically signed by Monica Martinez MD on 08/11/2019 at 4:05:38 PM.    Final     Cardiac database: EKG: EKG 08/09/2018: Sinus tachycardia at the rate of 120 bpm, leftward axis, incomplete right bundle branch block.  Anteroseptal infarct old.  Nonspecific ST-T abnormality, cannot exclude high lateral ischemia.  Compared to 06/01/2019, normal sinus rhythm with rate of 98 bpm with normal axis without ischemia.  Echocardiogram: 08/2017: LVEF 54%, mild concentric LVH, grade 1 diastolic impairment, mild TR, no pulmonary hypertension.  08/10/2019: LVEF 41-93% grade 2 diastolic impairment, severe hypokinesis of the anterior septal wall.  No significant valvular abnormality  Stress test: Lexiscan myoview stress test 04/13/2016: 1. The resting electrocardiogram demonstrated normal sinus rhythm, normal resting conduction, no resting arrhythmias and normal rest repolarization. Stress EKG is non-diagnostic for ischemia as it a pharmacologic stress using Lexiscan. Stress symptoms included dyspnea and chest discomfort. Hypertensive at rest and stress with peak BP 192/110 mm Hg. 2. Perfusion imaging study demonstrates soft tissue attenuation artifact in the inferior wall. There is no demonstrable ischemia or scar. LV systolic function was normal at 50% but visually appears to be more than 50%. This is a low risk study. Compared to the study 10/24/2012, small sized inferior ischemia is no longer present. This represents a low risk study.  Heart catheterization: 08/10/2019: LM: Normal LAD: Ostial 95% stenosis Ramus: Ostial/prox 30% stenosis LCx: Ostial 75% stenosis RCA: Prox-mid diffuse 40% calcific disease. Distal RCA 70% ISR LVEDP 17 mmHg  Scheduled Meds: . aspirin EC  81 mg Oral Daily  . atorvastatin  20 mg Oral QPM  . cloNIDine  0.2 mg Oral Daily  . insulin aspart  0-15 Units Subcutaneous TID WC  .  metoprolol succinate  200 mg Oral Daily  . polyethylene glycol  17 g Oral Daily  . sodium chloride flush  3 mL Intravenous Q12H  . tamsulosin  0.4 mg Oral QPC breakfast  . trospium  20 mg Oral BID  . verapamil  240 mg Oral Daily    Continuous Infusions: . sodium chloride Stopped (08/13/19 0325)  . sodium chloride    . heparin 1,100 Units/hr (08/13/19 0513)  . nitroGLYCERIN 10 mcg/min (08/13/19 0510)    PRN Meds: sodium chloride, acetaminophen, ALPRAZolam, alum & mag hydroxide-simeth, cromolyn, nitroGLYCERIN, ondansetron (ZOFRAN) IV, simethicone, sodium chloride flush   IMPRESSION & RECOMMENDATIONS: Christian [name] is a 74 y.o. male whose  past medical history and cardiac risk factors include: Multivessel CAD with angina pectoris, hypertension, hyperlipidemia, newly diagnosed cardiomyopathy, NSTEMI, acute/newly diagnosed segmental right lower lobe pulmonary embolism, advanced age, obesity.  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   Patient presented to the hospital with symptoms of typical angina and elevated cardiac biomarkers was ruled in for non-STEMI.  He underwent left heart catheterization yesterday and was found to have multivessel coronary artery disease.  Echocardiogram shows newly diagnosed ischemic cardiomyopathy.  In the setting of diabetes mellitus and multivessel coronary artery disease cardiothoracic consultation was requested and recommendations appreciated.  Patient is currently  undergoing  pre-CABG work-up.  He continues to episodic events of angina which resolved with sublingual nitroglycerin tablets.  Since he is feeling nauseated, one episode of vomiting, and constipation he is currently on nitro drip. EKG reviewed and downtrending trop.  Continue IV heparin drip  Continue incentive spirometry.  Case was discussed with the patient's nurse.  Patient's questions and concerns were addressed to his satisfaction. He voices understanding of the instructions provided during this encounter.   This note was created using a voice recognition software as a result there may be grammatical errors inadvertently enclosed that do not reflect the nature of this encounter. Every attempt is made to correct such errors.  Rex Kras, DO, [facility]. [place] Office: [phone] 08/13/2019, 12:14 PM

## 2019-08-14 ENCOUNTER — Other Ambulatory Visit: Payer: Medicare Other

## 2019-08-14 ENCOUNTER — Ambulatory Visit: Payer: Medicare Other | Admitting: Podiatry

## 2019-08-14 ENCOUNTER — Encounter (HOSPITAL_COMMUNITY): Payer: Medicare Other

## 2019-08-14 DIAGNOSIS — R109 Unspecified abdominal pain: Secondary | ICD-10-CM

## 2019-08-14 LAB — URINALYSIS, ROUTINE W REFLEX MICROSCOPIC
Bacteria, UA: NONE SEEN
Bilirubin Urine: NEGATIVE
Glucose, UA: 150 mg/dL — AB
Ketones, ur: NEGATIVE mg/dL
Nitrite: NEGATIVE
Protein, ur: NEGATIVE mg/dL
Specific Gravity, Urine: 1.006 (ref 1.005–1.030)
WBC, UA: >50 WBC/hpf — ABNORMAL HIGH (ref 0–5)
pH: 6 (ref 5.0–8.0)

## 2019-08-14 LAB — HEMOGLOBIN A1C
Hgb A1c MFr Bld: 7.7 % — ABNORMAL HIGH (ref 4.8–5.6)
Mean Plasma Glucose: 174.29 mg/dL

## 2019-08-14 LAB — GLUCOSE, CAPILLARY
Glucose-Capillary: 97 mg/dL (ref 70–99)
Glucose-Capillary: 92 mg/dL (ref 70–99)
Glucose-Capillary: 102 mg/dL — ABNORMAL HIGH (ref 70–99)
Glucose-Capillary: 164 mg/dL — ABNORMAL HIGH (ref 70–99)
Glucose-Capillary: 136 mg/dL — ABNORMAL HIGH (ref 70–99)

## 2019-08-14 LAB — CBC
HCT: 35.6 % — ABNORMAL LOW (ref 39.0–52.0)
Hemoglobin: 11.4 g/dL — ABNORMAL LOW (ref 13.0–17.0)
MCH: 33.2 pg (ref 26.0–34.0)
MCHC: 32 g/dL (ref 30.0–36.0)
MCV: 103.8 fL — ABNORMAL HIGH (ref 80.0–100.0)
Platelets: 195 10*3/uL (ref 150–400)
RBC: 3.43 MIL/uL — ABNORMAL LOW (ref 4.22–5.81)
RDW: 13.1 % (ref 11.5–15.5)
WBC: 9.6 10*3/uL (ref 4.0–10.5)
nRBC: 0 % (ref 0.0–0.2)

## 2019-08-14 LAB — ABO/RH: ABO/RH(D): O POS

## 2019-08-14 LAB — COMPREHENSIVE METABOLIC PANEL
ALT: 45 U/L — ABNORMAL HIGH (ref 0–44)
AST: 35 U/L (ref 15–41)
Albumin: 3.2 g/dL — ABNORMAL LOW (ref 3.5–5.0)
Alkaline Phosphatase: 70 U/L (ref 38–126)
Anion gap: 9 (ref 5–15)
BUN: 8 mg/dL (ref 8–23)
CO2: 21 mmol/L — ABNORMAL LOW (ref 22–32)
Calcium: 8.8 mg/dL — ABNORMAL LOW (ref 8.9–10.3)
Chloride: 109 mmol/L (ref 98–111)
Creatinine, Ser: 1.18 mg/dL (ref 0.61–1.24)
GFR calc Af Amer: >60 mL/min (ref >60)
GFR calc non Af Amer: >60 mL/min (ref >60)
Glucose, Bld: 101 mg/dL — ABNORMAL HIGH (ref 70–99)
Potassium: 4.2 mmol/L (ref 3.5–5.1)
Sodium: 139 mmol/L (ref 135–145)
Total Bilirubin: 0.5 mg/dL (ref 0.3–1.2)
Total Protein: 5.8 g/dL — ABNORMAL LOW (ref 6.5–8.1)

## 2019-08-14 LAB — SURGICAL PCR SCREEN
MRSA, PCR: NEGATIVE
Staphylococcus aureus: NEGATIVE

## 2019-08-14 LAB — PROTIME-INR
INR: 1.1 (ref 0.8–1.2)
Prothrombin Time: 14 seconds (ref 11.4–15.2)

## 2019-08-14 LAB — HEPARIN LEVEL (UNFRACTIONATED)
Heparin Unfractionated: 0.86 IU/mL — ABNORMAL HIGH (ref 0.30–0.70)
Heparin Unfractionated: 0.65 IU/mL (ref 0.30–0.70)

## 2019-08-14 LAB — APTT: aPTT: 133 seconds — ABNORMAL HIGH (ref 24–36)

## 2019-08-14 MED ORDER — HEPARIN BOLUS VIA INFUSION
2000.0000 [IU] | Freq: Once | INTRAVENOUS | Status: AC
  Administered 2019-08-14: 2000 [IU] via INTRAVENOUS
  Filled 2019-08-14: qty 2000

## 2019-08-14 MED ORDER — SODIUM CHLORIDE 0.9 % IV SOLN
INTRAVENOUS | Status: DC
  Filled 2019-08-14: qty 30

## 2019-08-14 MED ORDER — NOREPINEPHRINE 4 MG/250ML-% IV SOLN
0.0000 ug/min | INTRAVENOUS | Status: DC
  Filled 2019-08-14: qty 250

## 2019-08-14 MED ORDER — MILRINONE LACTATE IN DEXTROSE 20-5 MG/100ML-% IV SOLN
0.3000 ug/kg/min | INTRAVENOUS | Status: DC
  Filled 2019-08-14: qty 100

## 2019-08-14 MED ORDER — SODIUM CHLORIDE 0.9 % IV SOLN
1.5000 g | INTRAVENOUS | Status: AC
  Administered 2019-08-15: 08:00:00 1.5 g via INTRAVENOUS
  Filled 2019-08-14: qty 1.5

## 2019-08-14 MED ORDER — CHLORHEXIDINE GLUCONATE CLOTH 2 % EX PADS
6.0000 | MEDICATED_PAD | Freq: Once | CUTANEOUS | Status: AC
  Administered 2019-08-14: 6 via TOPICAL

## 2019-08-14 MED ORDER — SODIUM CHLORIDE 0.9 % IV SOLN
750.0000 mg | INTRAVENOUS | Status: AC
  Administered 2019-08-15: 750 mg via INTRAVENOUS
  Filled 2019-08-14: qty 750

## 2019-08-14 MED ORDER — VANCOMYCIN HCL 1500 MG/300ML IV SOLN
1500.0000 mg | INTRAVENOUS | Status: AC
  Administered 2019-08-15: 09:00:00 1500 mg via INTRAVENOUS
  Filled 2019-08-14: qty 300

## 2019-08-14 MED ORDER — CHLORHEXIDINE GLUCONATE CLOTH 2 % EX PADS
6.0000 | MEDICATED_PAD | Freq: Once | CUTANEOUS | Status: AC
  Administered 2019-08-14: 21:00:00 6 via TOPICAL

## 2019-08-14 MED ORDER — TRANEXAMIC ACID (OHS) BOLUS VIA INFUSION
15.0000 mg/kg | INTRAVENOUS | Status: AC
  Administered 2019-08-15: 08:00:00 1522.5 mg via INTRAVENOUS
  Filled 2019-08-14: qty 1523

## 2019-08-14 MED ORDER — POTASSIUM CHLORIDE 2 MEQ/ML IV SOLN
80.0000 meq | INTRAVENOUS | Status: DC
  Filled 2019-08-14: qty 40

## 2019-08-14 MED ORDER — PLASMA-LYTE 148 IV SOLN
INTRAVENOUS | Status: DC
  Filled 2019-08-14: qty 2.5

## 2019-08-14 MED ORDER — DEXMEDETOMIDINE HCL IN NACL 400 MCG/100ML IV SOLN
0.1000 ug/kg/h | INTRAVENOUS | Status: AC
  Administered 2019-08-15: 09:00:00 .2 ug/kg/h via INTRAVENOUS
  Filled 2019-08-14: qty 100

## 2019-08-14 MED ORDER — MAGNESIUM SULFATE 50 % IJ SOLN
40.0000 meq | INTRAMUSCULAR | Status: DC
  Filled 2019-08-14: qty 9.85

## 2019-08-14 MED ORDER — EPINEPHRINE HCL 5 MG/250ML IV SOLN IN NS
0.0000 ug/min | INTRAVENOUS | Status: DC
  Filled 2019-08-14: qty 250

## 2019-08-14 MED ORDER — PHENYLEPHRINE HCL-NACL 20-0.9 MG/250ML-% IV SOLN
30.0000 ug/min | INTRAVENOUS | Status: AC
  Administered 2019-08-15: 08:00:00 25 ug/min via INTRAVENOUS
  Filled 2019-08-14: qty 250

## 2019-08-14 MED ORDER — TEMAZEPAM 7.5 MG PO CAPS: 15.0000 mg | ORAL_CAPSULE | Freq: Once | ORAL | Status: DC | PRN

## 2019-08-14 MED ORDER — CHLORHEXIDINE GLUCONATE 0.12 % MT SOLN
15.0000 mL | Freq: Once | OROMUCOSAL | Status: AC
  Administered 2019-08-15: 15 mL via OROMUCOSAL
  Filled 2019-08-14: qty 15

## 2019-08-14 MED ORDER — METOPROLOL TARTRATE 12.5 MG HALF TABLET
12.5000 mg | ORAL_TABLET | Freq: Once | ORAL | Status: AC
  Administered 2019-08-15: 12.5 mg via ORAL
  Filled 2019-08-14: qty 1

## 2019-08-14 MED ORDER — METOPROLOL TARTRATE 5 MG/5ML IV SOLN: 2.5000 mg | Freq: Four times a day (QID) | INTRAVENOUS | Status: DC | PRN

## 2019-08-14 MED ORDER — BISACODYL 5 MG PO TBEC
5.0000 mg | DELAYED_RELEASE_TABLET | Freq: Once | ORAL | Status: AC
  Administered 2019-08-14: 5 mg via ORAL
  Filled 2019-08-14: qty 1

## 2019-08-14 MED ORDER — NITROGLYCERIN IN D5W 200-5 MCG/ML-% IV SOLN
2.0000 ug/min | INTRAVENOUS | Status: DC
  Filled 2019-08-14: qty 250

## 2019-08-14 MED ORDER — TRANEXAMIC ACID (OHS) PUMP PRIME SOLUTION
2.0000 mg/kg | INTRAVENOUS | Status: DC
  Filled 2019-08-14: qty 2.03

## 2019-08-14 MED ORDER — TRANEXAMIC ACID 1000 MG/10ML IV SOLN
1.5000 mg/kg/h | INTRAVENOUS | Status: AC
  Administered 2019-08-15: 1.5 mg/kg/h via INTRAVENOUS
  Filled 2019-08-14: qty 25

## 2019-08-14 MED ORDER — INSULIN REGULAR(HUMAN) IN NACL 100-0.9 UT/100ML-% IV SOLN
INTRAVENOUS | Status: AC
  Administered 2019-08-15: 08:00:00 2 [IU]/h via INTRAVENOUS
  Filled 2019-08-14: qty 100

## 2019-08-14 NOTE — Progress Notes (Signed)
ANTICOAGULATION CONSULT NOTE - Follow Up Consult  Pharmacy Consult for Heparin Indication: chest pain/ACS/PE  Assessment: 74 year old male on heparin for NSTEMI while awaiting CABG (tentatively planned for 4/20). CT confirmed small PE. Dopplers negative for DVT. No anticoagulation PTA. Plans for CABG this week.   Heparin level tonight remains <0.1 units/ml.  No issues noted with infusion Goal of Therapy:  Heparin level 0.3-0.7 units/ml Monitor platelets by anticoagulation protocol: Yes   Plan:  Bolus heparin with 2000 units Increase heparin to 1500 units/hour Daily heparin level and CBC Monitor for bleeding   Thanks for allowing pharmacy to be a part of this patient's care.  Excell Seltzer, PharmD Clinical Pharmacist

## 2019-08-14 NOTE — Progress Notes (Signed)
ANTICOAGULATION CONSULT NOTE - Follow Up Consult  Pharmacy Consult for Heparin Indication: chest pain/ACS + PE  No Known Allergies  Patient Measurements: Height: 5\' 9"  (175.3 cm) Weight: 101.5 kg (223 lb 12.3 oz) IBW/kg (Calculated) : 70.7 Heparin Dosing Weight: 90.6 kg  Vital Signs: Temp: 98.1 F (36.7 C) (04/19 0754) Temp Source: Oral (04/19 0754) BP: 161/99 (04/19 0754) Pulse Rate: 115 (04/19 0754)  Labs: Recent Labs    08/12/19 0300 08/12/19 0300 08/13/19 0242 08/13/19 1237 08/13/19 2314 08/14/19 0822 08/14/19 1830  HGB 12.0*   < > 11.9*  --   --  11.4*  --   HCT 37.2*  --  37.0*  --   --  35.6*  --   PLT 207  --  210  --   --  195  --   HEPARINUNFRC 0.53  --   --    < > <0.10* 0.86* 0.65  CREATININE  --   --  1.24  --   --   --   --   TROPONINIHS  --   --  1,071*  --   --   --   --    < > = values in this interval not displayed.    Estimated Creatinine Clearance: 63.2 mL/min (by C-G formula based on SCr of 1.24 mg/dL).  Assessment: 74 year-old male on heparin for NSTEMI, awaiting CABG planned for 4/20. CT chest confirmed PE. Dopplers negative for DVT. No anticoagulation prior to admission.   Heparin level subtherapeutic last nigt at <0.1, patient given heparin 2000 unit bolus and heparin drip rate increased to 1500 units/hour.   Heparin level this morning now supratherapeutic at 0.86. H&H stable at 11.4, PLTs WNL at 195. No issues with infusion or bleeding reported per RN.  Goal of Therapy:  Heparin level 0.3-0.7 units/ml Monitor platelets by anticoagulation protocol: Yes   Plan:  - Continue Heparin at 1350 units/hr (13.5 ml/hr) - Will continue to monitor for any signs/symptoms of bleeding and will follow up with heparin level with AM labs to confirm therapeutic  Thank you for allowing pharmacy to be a part of this patient's care.  Alycia Rossetti, PharmD, BCPS Clinical Pharmacist Clinical phone for 08/14/2019: [phone] 08/14/2019 7:22 PM    **Pharmacist phone directory can now be found on [email or web address] ([email or web address]).  Listed under [place].

## 2019-08-14 NOTE — Progress Notes (Addendum)
ANTICOAGULATION CONSULT NOTE - Follow Up Consult  Pharmacy Consult for Heparin Indication: chest pain/ACS  No Known Allergies  Patient Measurements: Height: 5\' 9"  (175.3 cm) Weight: 101.5 kg (223 lb 12.3 oz) IBW/kg (Calculated) : 70.7 Heparin Dosing Weight: 90.6 kg  Vital Signs: Temp: 98.1 F (36.7 C) (04/19 0754) Temp Source: Oral (04/19 0754) BP: 161/99 (04/19 0754) Pulse Rate: 115 (04/19 0754)  Labs: Recent Labs    08/12/19 0300 08/12/19 0300 08/13/19 0242 08/13/19 1237 08/13/19 2314 08/14/19 0822  HGB 12.0*   < > 11.9*  --   --  11.4*  HCT 37.2*  --  37.0*  --   --  35.6*  PLT 207  --  210  --   --  195  HEPARINUNFRC 0.53   < >  --  <0.10* <0.10* 0.86*  CREATININE  --   --  1.24  --   --   --   TROPONINIHS  --   --  1,071*  --   --   --    < > = values in this interval not displayed.    Estimated Creatinine Clearance: 63.2 mL/min (by C-G formula based on SCr of 1.24 mg/dL).  Assessment: 74 year-old male on heparin for NSTEMI, awaiting CABG planned for 4/20. CT chest confirmed PE. Dopplers negative for DVT. No anticoagulation prior to admission.   Heparin level subtherapeutic last nigt at <0.1, patient given heparin 2000 unit bolus and heparin drip rate increased to 1500 units/hour.   Heparin level this morning now supratherapeutic at 0.86. H&H stable at 11.4, PLTs WNL at 195. No issues with infusion or bleeding reported per RN.  Goal of Therapy:  Heparin level 0.3-0.7 units/ml Monitor platelets by anticoagulation protocol: Yes   Plan:  Decrease heparin drip rate to 1350 units/hour 8-hour heparin level at 1800 Daily heparin level and CBC Monitor for s/sx's of bleeding Follow-up plans to transition to oral anticoagulant (Eliquis co-pay $47.00)     Wynter Grave L. Devin Going, [place] PGY1 Pharmacy Resident [phone] 08/14/19      9:49 AM  Please check AMION for all [place] phone numbers After 10:00 PM, call the [place] [phone]

## 2019-08-14 NOTE — Progress Notes (Signed)
Progress Note  Patient Name: Christian [name] Date of Encounter: 08/14/2019  Attending physician: Adrian Prows, MD Primary care provider: Hoyt Koch, MD Primary Cardiologist: Dr. Adrian Prows  Subjective: Christian [name] is a 74 y.o. male  presented to the hospital with a chief complaint of " chest pain."  Patient seen and examined at bedside at approximately 7am. Patient has not had any chest pain since yesterday morning.  He continues to be on a nitro drip and heparin drip. Abdominal x-ray showed stool retention.  He had one bowel movement which helped his symptoms. Blood pressure heart rate not well controlled as he is been nauseated and vomiting yesterday.  Still has to take his morning pills.  Patient has been evaluated by CT surgery and plans to undergo coronary artery bypass grafting surgery tomorrow.  Objective: Vital Signs in the last 24 hours: Temp:  [98.1 F (36.7 C)-98.7 F (37.1 C)] 98.1 F (36.7 C) (04/19 0754) Pulse Rate:  [99-115] 115 (04/19 0754) Resp:  [20] 20 (04/19 0754) BP: (135-174)/(83-109) 161/99 (04/19 0754) SpO2:  [99 %-100 %] 100 % (04/19 0754) Weight:  [101.5 kg] 101.5 kg (04/19 0601)  Intake/Output from previous day: 04/18 0701 - 04/19 0700 In: 287.4 [P.O.:120; I.V.:167.4] Out: 1500 [Urine:1500]  Weights:  Filed Weights   08/12/19 0533 08/13/19 0514 08/14/19 0601  Weight: 99.1 kg 100.9 kg 101.5 kg    Telemetry: Personally reviewed.  Review of Systems  Constitution: Negative for chills and fever.  HENT: Negative for ear discharge, ear pain and nosebleeds.   Eyes: Negative for blurred vision and discharge.  Cardiovascular: Positive for chest pain (not at time of evaluation.) and leg swelling. Negative for claudication, dyspnea on exertion, near-syncope, orthopnea, palpitations, paroxysmal nocturnal dyspnea and syncope.  Respiratory: Negative for cough and shortness of breath.   Endocrine: Negative for polydipsia, polyphagia and  polyuria.  Hematologic/Lymphatic: Negative for bleeding problem.  Skin: Negative for flushing and nail changes.  Musculoskeletal: Negative for muscle cramps, muscle weakness and myalgias.  Gastrointestinal: Positive for constipation, nausea and vomiting. Negative for abdominal pain, dysphagia, hematemesis, hematochezia and melena.  Neurological: Negative for dizziness, focal weakness and light-headedness.    Physical examination: GEN:  Age-appropriate male, hemodynamically stable, no distress Neck: -JVP, supple.  Cardiac: RRR, no murmurs, rubs, or gallops.  Respiratory: Clear to auscultation bilaterally. GI: Soft, nontender, non-distended, positive bowel sounds in all 4 quadrants. Extremity:  1+ bilateral edema; No deformity. Neuro:  Nonfocal  Psych: Normal affect   Lab Results: Hematology Recent Labs  Lab 08/11/19 0426 08/12/19 0300 08/13/19 0242  WBC 6.2 8.3 9.8  RBC 3.61* 3.65* 3.55*  HGB 12.1* 12.0* 11.9*  HCT 37.1* 37.2* 37.0*  MCV 102.8* 101.9* 104.2*  MCH 33.5 32.9 33.5  MCHC 32.6 32.3 32.2  RDW 13.1 12.8 13.0  PLT 189 207 210    Chemistry Recent Labs  Lab 08/09/19 0922 08/09/19 1240 08/09/19 2306 08/10/19 1334 08/13/19 0242  NA 142   < > 138 141 141  K 4.4   < > 5.4* 4.2 4.2  CL 103   < > 106 107 113*  CO2 27   < > 19* 23 22  GLUCOSE 121*   < > 152* 128* 116*  BUN 38*   < > 29* 20 8  CREATININE 1.85*   < > 1.60* 1.42* 1.24  CALCIUM 10.1   < > 8.8* 8.8* 8.3*  PROT 7.0  --  6.1*  --   --   ALBUMIN 4.4  --  3.6  --   --   AST 57*  --  106*  --   --   ALT 62*  --  64*  --   --   ALKPHOS 93  --  79  --   --   BILITOT 0.3  --  1.2  --   --   GFRNONAA  --    < > 42* 49* 58*  GFRAA  --    < > 49* 57* >60  ANIONGAP  --    < > 13 11 6    < > = values in this interval not displayed.     Hepatic Function Panel  Recent Labs    07/24/19 1512 08/09/19 0922 08/09/19 2306  PROT 6.8 7.0 6.1*  ALBUMIN 4.4 4.4 3.6  AST 60* 57* 106*  ALT 78* 62* 64*   ALKPHOS 93 93 79  BILITOT 0.3 0.3 1.2    Cardiac Enzymes: Recent Labs    07/04/19 1133  CKTOTAL 56   Lab Results  Component Value Date   CKTOTAL 56 07/04/2019     BNP Recent Labs  Lab 08/09/19 0922 08/09/19 1240  BNP  --  89.1  PROBNP 78.0  --      DDimer No results for input(s): DDIMER in the last 168 hours.   Hemoglobin A1c:  Lab Results  Component Value Date   HGBA1C 7.7 (H) 07/24/2019   TSH  Recent Labs    04/19/19 0911  TSH 2.590    Lipid Panel     Component Value Date/Time   CHOL 107 08/11/2019 0426   TRIG 125 08/11/2019 0426   HDL 42 08/11/2019 0426   CHOLHDL 2.5 08/11/2019 0426   VLDL 25 08/11/2019 0426   LDLCALC 40 08/11/2019 0426   LDLDIRECT 40.0 11/23/2018 1120    Imaging: DG Abd 1 View  Result Date: 08/13/2019 CLINICAL DATA:  Abdominal pain and nausea. EXAM: ABDOMEN - 1 VIEW COMPARISON:  Abdomen and pelvis CT dated 12/18/2003. FINDINGS: Normal bowel gas pattern. Prominent stool in the right colon. Mild lumbar spine degenerative changes and mild scoliosis. Heterotopic ossification lateral to the left iliac bone at the superior acetabulum with an appearance suggesting previous trauma, unchanged. IMPRESSION: No acute abnormality.  Prominent stool in the right colon. Electronically Signed   By: Claudie Revering M.D.   On: 08/13/2019 16:54    Cardiac database: EKG: EKG 08/09/2018: Sinus tachycardia at the rate of 120 bpm, leftward axis, incomplete right bundle branch block.  Anteroseptal infarct old.  Nonspecific ST-T abnormality, cannot exclude high lateral ischemia.  Compared to 06/01/2019, normal sinus rhythm with rate of 98 bpm with normal axis without ischemia.  Echocardiogram: 08/2017: LVEF 54%, mild concentric LVH, grade 1 diastolic impairment, mild TR, no pulmonary hypertension.  08/10/2019: LVEF 65-78% grade 2 diastolic impairment, severe hypokinesis of the anterior septal wall.  No significant valvular abnormality  Stress test: Lexiscan  myoview stress test 04/13/2016: 1. The resting electrocardiogram demonstrated normal sinus rhythm, normal resting conduction, no resting arrhythmias and normal rest repolarization. Stress EKG is non-diagnostic for ischemia as it a pharmacologic stress using Lexiscan. Stress symptoms included dyspnea and chest discomfort. Hypertensive at rest and stress with peak BP 192/110 mm Hg. 2. Perfusion imaging study demonstrates soft tissue attenuation artifact in the inferior wall. There is no demonstrable ischemia or scar. LV systolic function was normal at 50% but visually appears to be more than 50%. This is a low risk study. Compared to the study 10/24/2012, small sized  inferior ischemia is no longer present. This represents a low risk study.  Heart catheterization: 08/10/2019: LM: Normal LAD: Ostial 95% stenosis Ramus: Ostial/prox 30% stenosis LCx: Ostial 75% stenosis RCA: Prox-mid diffuse 40% calcific disease. Distal RCA 70% ISR LVEDP 17 mmHg  Scheduled Meds: . aspirin EC  81 mg Oral Daily  . atorvastatin  20 mg Oral QPM  . cloNIDine  0.2 mg Oral Daily  . insulin aspart  0-15 Units Subcutaneous TID WC  . metoprolol succinate  200 mg Oral Daily  . polyethylene glycol  17 g Oral Daily  . sodium chloride flush  3 mL Intravenous Q12H  . tamsulosin  0.4 mg Oral QPC breakfast  . trospium  20 mg Oral BID  . verapamil  240 mg Oral Daily    Continuous Infusions: . sodium chloride Stopped (08/13/19 0325)  . sodium chloride    . heparin 1,500 Units/hr (08/14/19 0757)  . nitroGLYCERIN 4.5 mcg/min (08/14/19 0756)    PRN Meds: sodium chloride, acetaminophen, ALPRAZolam, alum & mag hydroxide-simeth, bisacodyl, cromolyn, metoprolol tartrate, nitroGLYCERIN, ondansetron (ZOFRAN) IV, simethicone, sodium chloride flush   IMPRESSION & RECOMMENDATIONS: EBON KETCHUM is a 74 y.o. male whose past medical history and cardiac risk factors include: Multivessel CAD with angina pectoris, hypertension,  hyperlipidemia, newly diagnosed cardiomyopathy, NSTEMI, acute/newly diagnosed segmental right lower lobe pulmonary embolism, advanced age, obesity.  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   Patient presented to the hospital with symptoms of typical angina and elevated cardiac biomarkers was ruled in for non-STEMI.  He underwent left heart catheterization yesterday and was found to have multivessel coronary artery disease.  Echocardiogram shows newly diagnosed ischemic cardiomyopathy.  In the setting of diabetes mellitus and multivessel coronary artery disease cardiothoracic consultation was requested and recommendations appreciated.  Patient is currently undergoing  pre-CABG work-up.  He continues to episodic events of angina which resolved with sublingual nitroglycerin tablets.  Yesterday he was having symptoms of nausea and vomiting.  I will therefore was started on nitro drip to help with his pain and blood pressures.  No chest pain since yesterday morning.  Patient is tachycardic but acidosis secondary to him not getting his medications as scheduled due to him being nauseated and vomiting.  He has been given his morning medications.  Continue to monitor.   We will start Lopressor 2.5 mg IV push as needed for heart rate greater than 100 bpm.  Abdominal x-ray results reviewed.  Continue IV heparin drip  Continue incentive spirometry.  Continue telemetry.  Patient's questions and concerns were  addressed to his satisfaction. He voices understanding of the instructions provided during this encounter.   This note was created using a voice recognition software as a result there may be grammatical errors inadvertently enclosed that do not reflect the nature of this encounter. Every attempt is made to correct such errors.  Rex Kras, DO, [facility]. [place] Office: [phone] 08/14/2019, 8:38 AM

## 2019-08-14 NOTE — Progress Notes (Signed)
CARDIAC REHAB PHASE I   PRE:  Rate/Rhythm: 90 SR    BP: lying 118/80    SaO2: 100 2L  MODE:  Ambulation: to recliner   POST:  Rate/Rhythm: 124 ST    BP: sitting 126/68     SaO2: 98 2L  Came to educate pt for preop. He endorses significant SOB with mobility. He has only stood to weigh and go to [facility] in one week. Had pt transfer to recliner to help his lungs. He is very weak and struggled to take steps to recliner. SOB with elevated HR with transfer. Able to do 1000 mL on IS. Encouraged more use. He stated he did it a couple days ago and had pain. Encouraged him to spread out use and stop if pain. Discussed sternal precautions, mobility post op and d/c planning. Pt will need PT post op due to significant weakness. He sts that PTA he moved what he wanted to. Left educational materials to review. [phone]   [place], ACSM 08/14/2019 12:01 PM

## 2019-08-14 NOTE — Care Management Important Message (Signed)
Important Message  Patient Details  Name: Christian [name] MRN: [id number] Date of Birth: 1946-03-05   Medicare Important Message Given:  Yes     [name] 08/14/2019, 9:49 AM

## 2019-08-15 ENCOUNTER — Inpatient Hospital Stay (HOSPITAL_COMMUNITY): Payer: Medicare Other

## 2019-08-15 ENCOUNTER — Inpatient Hospital Stay (HOSPITAL_COMMUNITY)
Admission: EM | Disposition: A | Discharge status: Skilled Nursing Facility | Payer: Self-pay | Source: Home / Self Care | Attending: Cardiothoracic Surgery

## 2019-08-15 ENCOUNTER — Encounter (HOSPITAL_COMMUNITY): Payer: Self-pay | Admitting: Cardiothoracic Surgery

## 2019-08-15 ENCOUNTER — Inpatient Hospital Stay (HOSPITAL_COMMUNITY): Payer: Medicare Other | Admitting: Certified Registered Nurse Anesthetist

## 2019-08-15 DIAGNOSIS — I2510 Atherosclerotic heart disease of native coronary artery without angina pectoris: Secondary | ICD-10-CM | POA: Diagnosis present

## 2019-08-15 DIAGNOSIS — Z87891 Personal history of nicotine dependence: Secondary | ICD-10-CM

## 2019-08-15 DIAGNOSIS — Z951 Presence of aortocoronary bypass graft: Secondary | ICD-10-CM

## 2019-08-15 DIAGNOSIS — I25110 Atherosclerotic heart disease of native coronary artery with unstable angina pectoris: Secondary | ICD-10-CM | POA: Diagnosis not present

## 2019-08-15 HISTORY — DX: Presence of aortocoronary bypass graft: Z95.1

## 2019-08-15 HISTORY — PX: TEE WITHOUT CARDIOVERSION: SHX5443

## 2019-08-15 HISTORY — PX: CORONARY ARTERY BYPASS GRAFT: SHX141

## 2019-08-15 HISTORY — PX: RADIAL ARTERY HARVEST: SHX5067

## 2019-08-15 LAB — POCT I-STAT, CHEM 8
BUN: 6 mg/dL — ABNORMAL LOW (ref 8–23)
BUN: 5 mg/dL — ABNORMAL LOW (ref 8–23)
BUN: 6 mg/dL — ABNORMAL LOW (ref 8–23)
BUN: 5 mg/dL — ABNORMAL LOW (ref 8–23)
BUN: 5 mg/dL — ABNORMAL LOW (ref 8–23)
Calcium, Ion: 1.2 mmol/L (ref 1.15–1.40)
Calcium, Ion: 1.1 mmol/L — ABNORMAL LOW (ref 1.15–1.40)
Calcium, Ion: 1.28 mmol/L (ref 1.15–1.40)
Calcium, Ion: 1.06 mmol/L — ABNORMAL LOW (ref 1.15–1.40)
Calcium, Ion: 1.12 mmol/L — ABNORMAL LOW (ref 1.15–1.40)
Chloride: 107 mmol/L (ref 98–111)
Chloride: 106 mmol/L (ref 98–111)
Chloride: 108 mmol/L (ref 98–111)
Chloride: 105 mmol/L (ref 98–111)
Chloride: 106 mmol/L (ref 98–111)
Creatinine, Ser: 1 mg/dL (ref 0.61–1.24)
Creatinine, Ser: 0.9 mg/dL (ref 0.61–1.24)
Creatinine, Ser: 1 mg/dL (ref 0.61–1.24)
Creatinine, Ser: 0.8 mg/dL (ref 0.61–1.24)
Creatinine, Ser: 0.9 mg/dL (ref 0.61–1.24)
Glucose, Bld: 119 mg/dL — ABNORMAL HIGH (ref 70–99)
Glucose, Bld: 106 mg/dL — ABNORMAL HIGH (ref 70–99)
Glucose, Bld: 145 mg/dL — ABNORMAL HIGH (ref 70–99)
Glucose, Bld: 136 mg/dL — ABNORMAL HIGH (ref 70–99)
Glucose, Bld: 149 mg/dL — ABNORMAL HIGH (ref 70–99)
HCT: 28 % — ABNORMAL LOW (ref 39.0–52.0)
HCT: 24 % — ABNORMAL LOW (ref 39.0–52.0)
HCT: 28 % — ABNORMAL LOW (ref 39.0–52.0)
HCT: 27 % — ABNORMAL LOW (ref 39.0–52.0)
HCT: 26 % — ABNORMAL LOW (ref 39.0–52.0)
Hemoglobin: 9.5 g/dL — ABNORMAL LOW (ref 13.0–17.0)
Hemoglobin: 8.2 g/dL — ABNORMAL LOW (ref 13.0–17.0)
Hemoglobin: 9.5 g/dL — ABNORMAL LOW (ref 13.0–17.0)
Hemoglobin: 9.2 g/dL — ABNORMAL LOW (ref 13.0–17.0)
Hemoglobin: 8.8 g/dL — ABNORMAL LOW (ref 13.0–17.0)
Potassium: 4.1 mmol/L (ref 3.5–5.1)
Potassium: 4 mmol/L (ref 3.5–5.1)
Potassium: 5.2 mmol/L — ABNORMAL HIGH (ref 3.5–5.1)
Potassium: 4.9 mmol/L (ref 3.5–5.1)
Potassium: 4.3 mmol/L (ref 3.5–5.1)
Sodium: 141 mmol/L (ref 135–145)
Sodium: 140 mmol/L (ref 135–145)
Sodium: 141 mmol/L (ref 135–145)
Sodium: 141 mmol/L (ref 135–145)
Sodium: 142 mmol/L (ref 135–145)
TCO2: 23 mmol/L (ref 22–32)
TCO2: 23 mmol/L (ref 22–32)
TCO2: 25 mmol/L (ref 22–32)
TCO2: 24 mmol/L (ref 22–32)
TCO2: 25 mmol/L (ref 22–32)

## 2019-08-15 LAB — POCT I-STAT 7, (LYTES, BLD GAS, ICA,H+H)
Acid-base deficit: 4 mmol/L — ABNORMAL HIGH (ref 0.0–2.0)
Acid-base deficit: 3 mmol/L — ABNORMAL HIGH (ref 0.0–2.0)
Acid-base deficit: 5 mmol/L — ABNORMAL HIGH (ref 0.0–2.0)
Acid-base deficit: 1 mmol/L (ref 0.0–2.0)
Bicarbonate: 21.6 mmol/L (ref 20.0–28.0)
Bicarbonate: 25.3 mmol/L (ref 20.0–28.0)
Bicarbonate: 22.8 mmol/L (ref 20.0–28.0)
Bicarbonate: 21.8 mmol/L (ref 20.0–28.0)
Bicarbonate: 24.3 mmol/L (ref 20.0–28.0)
Calcium, Ion: 1.1 mmol/L — ABNORMAL LOW (ref 1.15–1.40)
Calcium, Ion: 1.26 mmol/L (ref 1.15–1.40)
Calcium, Ion: 1.13 mmol/L — ABNORMAL LOW (ref 1.15–1.40)
Calcium, Ion: 1.17 mmol/L (ref 1.15–1.40)
Calcium, Ion: 1.07 mmol/L — ABNORMAL LOW (ref 1.15–1.40)
HCT: 19 % — ABNORMAL LOW (ref 39.0–52.0)
HCT: 31 % — ABNORMAL LOW (ref 39.0–52.0)
HCT: 31 % — ABNORMAL LOW (ref 39.0–52.0)
HCT: 30 % — ABNORMAL LOW (ref 39.0–52.0)
HCT: 24 % — ABNORMAL LOW (ref 39.0–52.0)
Hemoglobin: 10.5 g/dL — ABNORMAL LOW (ref 13.0–17.0)
Hemoglobin: 6.5 g/dL — CL (ref 13.0–17.0)
Hemoglobin: 10.5 g/dL — ABNORMAL LOW (ref 13.0–17.0)
Hemoglobin: 10.2 g/dL — ABNORMAL LOW (ref 13.0–17.0)
Hemoglobin: 8.2 g/dL — ABNORMAL LOW (ref 13.0–17.0)
O2 Saturation: 100 %
O2 Saturation: 100 %
O2 Saturation: 100 %
O2 Saturation: 99 %
O2 Saturation: 100 %
Patient temperature: 37.7
Patient temperature: 38
Potassium: 4.1 mmol/L (ref 3.5–5.1)
Potassium: 4.1 mmol/L (ref 3.5–5.1)
Potassium: 4.3 mmol/L (ref 3.5–5.1)
Potassium: 4.4 mmol/L (ref 3.5–5.1)
Potassium: 4.4 mmol/L (ref 3.5–5.1)
Sodium: 141 mmol/L (ref 135–145)
Sodium: 142 mmol/L (ref 135–145)
Sodium: 142 mmol/L (ref 135–145)
Sodium: 143 mmol/L (ref 135–145)
Sodium: 143 mmol/L (ref 135–145)
TCO2: 23 mmol/L (ref 22–32)
TCO2: 27 mmol/L (ref 22–32)
TCO2: 24 mmol/L (ref 22–32)
TCO2: 23 mmol/L (ref 22–32)
TCO2: 25 mmol/L (ref 22–32)
pCO2 arterial: 41.1 mmHg (ref 32.0–48.0)
pCO2 arterial: 41.8 mmHg (ref 32.0–48.0)
pCO2 arterial: 44.9 mmHg (ref 32.0–48.0)
pCO2 arterial: 50 mmHg — ABNORMAL HIGH (ref 32.0–48.0)
pCO2 arterial: 42 mmHg (ref 32.0–48.0)
pH, Arterial: 7.329 — ABNORMAL LOW (ref 7.350–7.450)
pH, Arterial: 7.39 (ref 7.350–7.450)
pH, Arterial: 7.315 — ABNORMAL LOW (ref 7.350–7.450)
pH, Arterial: 7.251 — ABNORMAL LOW (ref 7.350–7.450)
pH, Arterial: 7.375 (ref 7.350–7.450)
pO2, Arterial: 228 mmHg — ABNORMAL HIGH (ref 83.0–108.0)
pO2, Arterial: 311 mmHg — ABNORMAL HIGH (ref 83.0–108.0)
pO2, Arterial: 275 mmHg — ABNORMAL HIGH (ref 83.0–108.0)
pO2, Arterial: 176 mmHg — ABNORMAL HIGH (ref 83.0–108.0)
pO2, Arterial: 203 mmHg — ABNORMAL HIGH (ref 83.0–108.0)

## 2019-08-15 LAB — CBC
HCT: 30.5 % — ABNORMAL LOW (ref 39.0–52.0)
HCT: 33.8 % — ABNORMAL LOW (ref 39.0–52.0)
HCT: 27.4 % — ABNORMAL LOW (ref 39.0–52.0)
Hemoglobin: 9.8 g/dL — ABNORMAL LOW (ref 13.0–17.0)
Hemoglobin: 11 g/dL — ABNORMAL LOW (ref 13.0–17.0)
Hemoglobin: 9 g/dL — ABNORMAL LOW (ref 13.0–17.0)
MCH: 33 pg (ref 26.0–34.0)
MCH: 32.1 pg (ref 26.0–34.0)
MCH: 32.3 pg (ref 26.0–34.0)
MCHC: 32.1 g/dL (ref 30.0–36.0)
MCHC: 32.5 g/dL (ref 30.0–36.0)
MCHC: 32.8 g/dL (ref 30.0–36.0)
MCV: 102.7 fL — ABNORMAL HIGH (ref 80.0–100.0)
MCV: 98.5 fL (ref 80.0–100.0)
MCV: 98.2 fL (ref 80.0–100.0)
Platelets: 198 10*3/uL (ref 150–400)
Platelets: 102 10*3/uL — ABNORMAL LOW (ref 150–400)
Platelets: 112 10*3/uL — ABNORMAL LOW (ref 150–400)
RBC: 2.97 MIL/uL — ABNORMAL LOW (ref 4.22–5.81)
RBC: 3.43 MIL/uL — ABNORMAL LOW (ref 4.22–5.81)
RBC: 2.79 MIL/uL — ABNORMAL LOW (ref 4.22–5.81)
RDW: 13.3 % (ref 11.5–15.5)
RDW: 16.9 % — ABNORMAL HIGH (ref 11.5–15.5)
RDW: 16.7 % — ABNORMAL HIGH (ref 11.5–15.5)
WBC: 10.1 10*3/uL (ref 4.0–10.5)
WBC: 11.1 10*3/uL — ABNORMAL HIGH (ref 4.0–10.5)
WBC: 8.9 10*3/uL (ref 4.0–10.5)
nRBC: 0 % (ref 0.0–0.2)
nRBC: 0 % (ref 0.0–0.2)
nRBC: 0 % (ref 0.0–0.2)

## 2019-08-15 LAB — GLUCOSE, CAPILLARY
Glucose-Capillary: 109 mg/dL — ABNORMAL HIGH (ref 70–99)
Glucose-Capillary: 115 mg/dL — ABNORMAL HIGH (ref 70–99)
Glucose-Capillary: 117 mg/dL — ABNORMAL HIGH (ref 70–99)
Glucose-Capillary: 118 mg/dL — ABNORMAL HIGH (ref 70–99)
Glucose-Capillary: 118 mg/dL — ABNORMAL HIGH (ref 70–99)
Glucose-Capillary: 121 mg/dL — ABNORMAL HIGH (ref 70–99)
Glucose-Capillary: 127 mg/dL — ABNORMAL HIGH (ref 70–99)
Glucose-Capillary: 127 mg/dL — ABNORMAL HIGH (ref 70–99)
Glucose-Capillary: 135 mg/dL — ABNORMAL HIGH (ref 70–99)
Glucose-Capillary: 145 mg/dL — ABNORMAL HIGH (ref 70–99)

## 2019-08-15 LAB — COOXEMETRY PANEL
Carboxyhemoglobin: 1.2 % (ref 0.5–1.5)
Methemoglobin: 1.4 % (ref 0.0–1.5)
O2 Saturation: 67.5 %
Total hemoglobin: 8.8 g/dL — ABNORMAL LOW (ref 12.0–16.0)

## 2019-08-15 LAB — ECHO INTRAOPERATIVE TEE
Height: 69 in
Weight: 3534.4 oz

## 2019-08-15 LAB — HEPARIN LEVEL (UNFRACTIONATED): Heparin Unfractionated: 0.72 IU/mL — ABNORMAL HIGH (ref 0.30–0.70)

## 2019-08-15 LAB — BLOOD GAS, ARTERIAL
Acid-base deficit: 0.4 mmol/L (ref 0.0–2.0)
Bicarbonate: 23.3 mmol/L (ref 20.0–28.0)
FIO2: 32
O2 Saturation: 98.9 %
Patient temperature: 37
pCO2 arterial: 35.9 mmHg (ref 32.0–48.0)
pH, Arterial: 7.429 (ref 7.350–7.450)
pO2, Arterial: 179 mmHg — ABNORMAL HIGH (ref 83.0–108.0)

## 2019-08-15 LAB — HEMOGLOBIN AND HEMATOCRIT, BLOOD
HCT: 20.9 % — ABNORMAL LOW (ref 39.0–52.0)
Hemoglobin: 6.8 g/dL — CL (ref 13.0–17.0)

## 2019-08-15 LAB — BASIC METABOLIC PANEL
Anion gap: 7 (ref 5–15)
Anion gap: 6 (ref 5–15)
BUN: 7 mg/dL — ABNORMAL LOW (ref 8–23)
BUN: 6 mg/dL — ABNORMAL LOW (ref 8–23)
CO2: 24 mmol/L (ref 22–32)
CO2: 24 mmol/L (ref 22–32)
Calcium: 8.5 mg/dL — ABNORMAL LOW (ref 8.9–10.3)
Calcium: 7.5 mg/dL — ABNORMAL LOW (ref 8.9–10.3)
Chloride: 109 mmol/L (ref 98–111)
Chloride: 112 mmol/L — ABNORMAL HIGH (ref 98–111)
Creatinine, Ser: 1.23 mg/dL (ref 0.61–1.24)
Creatinine, Ser: 1.14 mg/dL (ref 0.61–1.24)
GFR calc Af Amer: >60 mL/min (ref >60)
GFR calc Af Amer: >60 mL/min (ref >60)
GFR calc non Af Amer: 58 mL/min — ABNORMAL LOW (ref >60)
GFR calc non Af Amer: >60 mL/min (ref >60)
Glucose, Bld: 115 mg/dL — ABNORMAL HIGH (ref 70–99)
Glucose, Bld: 135 mg/dL — ABNORMAL HIGH (ref 70–99)
Potassium: 4 mmol/L (ref 3.5–5.1)
Potassium: 4.5 mmol/L (ref 3.5–5.1)
Sodium: 140 mmol/L (ref 135–145)
Sodium: 142 mmol/L (ref 135–145)

## 2019-08-15 LAB — PLATELET COUNT: Platelets: 101 10*3/uL — ABNORMAL LOW (ref 150–400)

## 2019-08-15 LAB — PROTIME-INR
INR: 1.5 — ABNORMAL HIGH (ref 0.8–1.2)
Prothrombin Time: 17.5 seconds — ABNORMAL HIGH (ref 11.4–15.2)

## 2019-08-15 LAB — PREPARE RBC (CROSSMATCH)

## 2019-08-15 LAB — TRIGLYCERIDES: Triglycerides: 90 mg/dL (ref <150)

## 2019-08-15 LAB — APTT: aPTT: 32 seconds (ref 24–36)

## 2019-08-15 LAB — MAGNESIUM: Magnesium: 2.8 mg/dL — ABNORMAL HIGH (ref 1.7–2.4)

## 2019-08-15 SURGERY — CORONARY ARTERY BYPASS GRAFTING (CABG)
Anesthesia: General | Site: Chest

## 2019-08-15 MED ORDER — VANCOMYCIN HCL 1000 MG IV SOLR
INTRAVENOUS | Status: DC | PRN
  Administered 2019-08-15: 3 g

## 2019-08-15 MED ORDER — ACETAMINOPHEN 650 MG RE SUPP
650.0000 mg | Freq: Once | RECTAL | Status: AC
  Administered 2019-08-15: 650 mg via RECTAL

## 2019-08-15 MED ORDER — LACTATED RINGERS IV SOLN: INTRAVENOUS | Status: DC | PRN

## 2019-08-15 MED ORDER — BUPIVACAINE LIPOSOME 1.3 % IJ SUSP
INTRAMUSCULAR | Status: DC | PRN
  Administered 2019-08-15: 50 mL

## 2019-08-15 MED ORDER — ACETAMINOPHEN 160 MG/5ML PO SOLN: 650.0000 mg | Freq: Once | ORAL | Status: AC

## 2019-08-15 MED ORDER — PHENYLEPHRINE HCL (PRESSORS) 10 MG/ML IV SOLN
INTRAVENOUS | Status: DC | PRN
  Administered 2019-08-15: 80 ug via INTRAVENOUS
  Administered 2019-08-15 (×3): 40 ug via INTRAVENOUS

## 2019-08-15 MED ORDER — DOCUSATE SODIUM 100 MG PO CAPS
200.0000 mg | ORAL_CAPSULE | Freq: Every day | ORAL | Status: DC
  Filled 2019-08-15: qty 2

## 2019-08-15 MED ORDER — DEXTROSE 50 % IV SOLN
0.0000 mL | INTRAVENOUS | Status: DC | PRN
  Administered 2019-09-18 (×2): 50 mL via INTRAVENOUS
  Filled 2019-08-15 (×2): qty 50

## 2019-08-15 MED ORDER — FENTANYL CITRATE (PF) 250 MCG/5ML IJ SOLN
INTRAMUSCULAR | Status: DC | PRN
  Administered 2019-08-15: 250 ug via INTRAVENOUS
  Administered 2019-08-15: 100 ug via INTRAVENOUS
  Administered 2019-08-15 (×2): 50 ug via INTRAVENOUS
  Administered 2019-08-15: 150 ug via INTRAVENOUS
  Administered 2019-08-15 (×2): 50 ug via INTRAVENOUS
  Administered 2019-08-15 (×6): 100 ug via INTRAVENOUS

## 2019-08-15 MED ORDER — MIDAZOLAM HCL 2 MG/2ML IJ SOLN
INTRAMUSCULAR | Status: AC
  Filled 2019-08-15: qty 2

## 2019-08-15 MED ORDER — STERILE WATER FOR IRRIGATION IR SOLN
Status: DC | PRN
  Administered 2019-08-15: 2000 mL

## 2019-08-15 MED ORDER — ONDANSETRON HCL 4 MG/2ML IJ SOLN
INTRAMUSCULAR | Status: AC
  Filled 2019-08-15: qty 2

## 2019-08-15 MED ORDER — SODIUM CHLORIDE (PF) 0.9 % IJ SOLN
OROMUCOSAL | Status: DC | PRN
  Administered 2019-08-15 (×2): 4 mL via TOPICAL

## 2019-08-15 MED ORDER — 0.9 % SODIUM CHLORIDE (POUR BTL) OPTIME
TOPICAL | Status: DC | PRN
  Administered 2019-08-15: 5000 mL

## 2019-08-15 MED ORDER — FENTANYL CITRATE (PF) 250 MCG/5ML IJ SOLN
INTRAMUSCULAR | Status: AC
  Filled 2019-08-15: qty 5

## 2019-08-15 MED ORDER — NON FORMULARY
Status: DC | PRN
  Administered 2019-08-15: 30 mL via INTRAMUSCULAR

## 2019-08-15 MED ORDER — LACTATED RINGERS IV SOLN: INTRAVENOUS | Status: DC

## 2019-08-15 MED ORDER — ACETAMINOPHEN 500 MG PO TABS: 1000.0000 mg | ORAL_TABLET | Freq: Four times a day (QID) | ORAL | Status: AC

## 2019-08-15 MED ORDER — BISACODYL 10 MG RE SUPP
10.0000 mg | Freq: Every day | RECTAL | Status: DC
  Administered 2019-08-18 – 2019-10-13 (×11): 10 mg via RECTAL
  Filled 2019-08-15 (×12): qty 1

## 2019-08-15 MED ORDER — TRANEXAMIC ACID 1000 MG/10ML IV SOLN
1.5000 mg/kg/h | INTRAVENOUS | Status: DC
  Filled 2019-08-15: qty 25

## 2019-08-15 MED ORDER — TROSPIUM CHLORIDE 20 MG PO TABS
20.0000 mg | ORAL_TABLET | Freq: Two times a day (BID) | ORAL | Status: DC
  Administered 2019-08-16 – 2019-08-17 (×3): 20 mg via ORAL
  Filled 2019-08-15 (×8): qty 1

## 2019-08-15 MED ORDER — MIDAZOLAM HCL 2 MG/2ML IJ SOLN
2.0000 mg | INTRAMUSCULAR | Status: DC | PRN
  Administered 2019-08-17: 2 mg via INTRAVENOUS
  Filled 2019-08-15 (×2): qty 2

## 2019-08-15 MED ORDER — SODIUM CHLORIDE 0.9 % IV SOLN
1.5000 g | Freq: Two times a day (BID) | INTRAVENOUS | Status: AC
  Administered 2019-08-15 – 2019-08-17 (×4): 1.5 g via INTRAVENOUS
  Filled 2019-08-15 (×4): qty 1.5

## 2019-08-15 MED ORDER — VANCOMYCIN HCL IN DEXTROSE 1-5 GM/200ML-% IV SOLN
1000.0000 mg | Freq: Once | INTRAVENOUS | Status: AC
  Administered 2019-08-15: 21:00:00 1000 mg via INTRAVENOUS
  Filled 2019-08-15: qty 200

## 2019-08-15 MED ORDER — SODIUM CHLORIDE 0.9 % IV SOLN
250.0000 mL | INTRAVENOUS | Status: DC
  Administered 2019-08-21: 250 mL via INTRAVENOUS

## 2019-08-15 MED ORDER — PROTAMINE SULFATE 10 MG/ML IV SOLN
INTRAVENOUS | Status: DC | PRN
  Administered 2019-08-15: 290 mg via INTRAVENOUS

## 2019-08-15 MED ORDER — MAGNESIUM SULFATE 4 GM/100ML IV SOLN
4.0000 g | Freq: Once | INTRAVENOUS | Status: AC
  Administered 2019-08-15: 4 g via INTRAVENOUS
  Filled 2019-08-15: qty 100

## 2019-08-15 MED ORDER — METOPROLOL TARTRATE 25 MG/10 ML ORAL SUSPENSION
12.5000 mg | Freq: Two times a day (BID) | ORAL | Status: DC
  Administered 2019-08-16: 12.5 mg
  Filled 2019-08-15 (×3): qty 5

## 2019-08-15 MED ORDER — LACTATED RINGERS IV SOLN: 500.0000 mL | Freq: Once | INTRAVENOUS | Status: DC | PRN

## 2019-08-15 MED ORDER — HEPARIN SODIUM (PORCINE) 1000 UNIT/ML IJ SOLN
INTRAMUSCULAR | Status: AC
  Filled 2019-08-15: qty 1

## 2019-08-15 MED ORDER — EPINEPHRINE PF 1 MG/ML IJ SOLN
INTRAVENOUS | Status: DC | PRN
  Administered 2019-08-15: 2 ug/min via INTRAVENOUS

## 2019-08-15 MED ORDER — METOPROLOL TARTRATE 12.5 MG HALF TABLET: 12.5000 mg | ORAL_TABLET | Freq: Two times a day (BID) | ORAL | Status: DC

## 2019-08-15 MED ORDER — VANCOMYCIN HCL 1000 MG IV SOLR
INTRAVENOUS | Status: AC
  Filled 2019-08-15: qty 3000

## 2019-08-15 MED ORDER — STERILE WATER FOR INJECTION IJ SOLN
INTRAMUSCULAR | Status: AC
  Filled 2019-08-15: qty 10

## 2019-08-15 MED ORDER — PROTAMINE SULFATE 10 MG/ML IV SOLN
INTRAVENOUS | Status: AC
  Filled 2019-08-15: qty 25

## 2019-08-15 MED ORDER — SODIUM BICARBONATE 8.4 % IV SOLN
100.0000 meq | Freq: Once | INTRAVENOUS | Status: AC
  Administered 2019-08-15: 100 meq via INTRAVENOUS

## 2019-08-15 MED ORDER — INSULIN REGULAR(HUMAN) IN NACL 100-0.9 UT/100ML-% IV SOLN
INTRAVENOUS | Status: DC
  Administered 2019-08-16: 1.9 [IU]/h via INTRAVENOUS
  Administered 2019-08-17: 7 [IU]/h via INTRAVENOUS
  Administered 2019-08-18: 8.5 [IU]/h via INTRAVENOUS
  Filled 2019-08-15 (×6): qty 100

## 2019-08-15 MED ORDER — HEMOSTATIC AGENTS (NO CHARGE) OPTIME
TOPICAL | Status: DC | PRN
  Administered 2019-08-15 (×4): 1 via TOPICAL

## 2019-08-15 MED ORDER — MORPHINE SULFATE (PF) 2 MG/ML IV SOLN
1.0000 mg | INTRAVENOUS | Status: DC | PRN
  Administered 2019-08-15: 4 mg via INTRAVENOUS
  Administered 2019-08-15: 2 mg via INTRAVENOUS
  Administered 2019-08-15 – 2019-08-17 (×9): 4 mg via INTRAVENOUS
  Administered 2019-08-18 – 2019-08-20 (×3): 2 mg via INTRAVENOUS
  Filled 2019-08-15 (×3): qty 2
  Filled 2019-08-15 (×2): qty 1
  Filled 2019-08-15 (×5): qty 2
  Filled 2019-08-15: qty 1
  Filled 2019-08-15 (×2): qty 2
  Filled 2019-08-15: qty 1
  Filled 2019-08-15 (×2): qty 2

## 2019-08-15 MED ORDER — SODIUM CHLORIDE 0.9 % IV SOLN
INTRAVENOUS | Status: DC
  Administered 2019-08-21: 20 mL/h via INTRAVENOUS

## 2019-08-15 MED ORDER — EPINEPHRINE HCL 5 MG/250ML IV SOLN IN NS: 0.5000 ug/min | INTRAVENOUS | Status: DC

## 2019-08-15 MED ORDER — HEPARIN SODIUM (PORCINE) 1000 UNIT/ML IJ SOLN
INTRAMUSCULAR | Status: DC | PRN
  Administered 2019-08-15: 32000 [IU] via INTRAVENOUS

## 2019-08-15 MED ORDER — SODIUM CHLORIDE 0.45 % IV SOLN: INTRAVENOUS | Status: DC | PRN

## 2019-08-15 MED ORDER — BUPIVACAINE HCL (PF) 0.5 % IJ SOLN
INTRAMUSCULAR | Status: AC
  Filled 2019-08-15: qty 30

## 2019-08-15 MED ORDER — PHENYLEPHRINE 40 MCG/ML (10ML) SYRINGE FOR IV PUSH (FOR BLOOD PRESSURE SUPPORT)
PREFILLED_SYRINGE | INTRAVENOUS | Status: AC
  Filled 2019-08-15: qty 10

## 2019-08-15 MED ORDER — ORAL CARE MOUTH RINSE
15.0000 mL | OROMUCOSAL | Status: DC
  Administered 2019-08-15 – 2019-08-19 (×39): 15 mL via OROMUCOSAL

## 2019-08-15 MED ORDER — ASPIRIN 81 MG PO CHEW
324.0000 mg | CHEWABLE_TABLET | Freq: Every day | ORAL | Status: DC
  Administered 2019-08-17: 324 mg
  Filled 2019-08-15 (×3): qty 4

## 2019-08-15 MED ORDER — ASPIRIN EC 325 MG PO TBEC: 325.0000 mg | DELAYED_RELEASE_TABLET | Freq: Every day | ORAL | Status: DC

## 2019-08-15 MED ORDER — PROPOFOL 10 MG/ML IV BOLUS
INTRAVENOUS | Status: DC | PRN
  Administered 2019-08-15: 500 mg via INTRAVENOUS

## 2019-08-15 MED ORDER — ROCURONIUM BROMIDE 10 MG/ML (PF) SYRINGE
PREFILLED_SYRINGE | INTRAVENOUS | Status: AC
  Filled 2019-08-15: qty 10

## 2019-08-15 MED ORDER — SODIUM CHLORIDE 0.9% FLUSH: 3.0000 mL | INTRAVENOUS | Status: DC | PRN

## 2019-08-15 MED ORDER — ALBUMIN HUMAN 5 % IV SOLN: INTRAVENOUS | Status: DC | PRN

## 2019-08-15 MED ORDER — CHLORHEXIDINE GLUCONATE 0.12 % MT SOLN
15.0000 mL | OROMUCOSAL | Status: AC
  Administered 2019-08-15: 15 mL via OROMUCOSAL

## 2019-08-15 MED ORDER — PLASMA-LYTE 148 IV SOLN
INTRAVENOUS | Status: DC | PRN
  Administered 2019-08-15: 500 mL via INTRAVASCULAR

## 2019-08-15 MED ORDER — MIDAZOLAM HCL (PF) 10 MG/2ML IJ SOLN
INTRAMUSCULAR | Status: AC
  Filled 2019-08-15: qty 2

## 2019-08-15 MED ORDER — METOPROLOL TARTRATE 5 MG/5ML IV SOLN
2.5000 mg | INTRAVENOUS | Status: DC | PRN
  Administered 2019-08-17 (×2): 5 mg via INTRAVENOUS
  Filled 2019-08-15 (×2): qty 5

## 2019-08-15 MED ORDER — ACETAMINOPHEN 160 MG/5ML PO SOLN
1000.0000 mg | Freq: Four times a day (QID) | ORAL | Status: AC
  Administered 2019-08-15 – 2019-08-19 (×9): 1000 mg
  Filled 2019-08-15 (×9): qty 40.6

## 2019-08-15 MED ORDER — ONDANSETRON HCL 4 MG/2ML IJ SOLN
4.0000 mg | Freq: Four times a day (QID) | INTRAMUSCULAR | Status: DC | PRN
  Administered 2019-09-12 – 2019-10-06 (×2): 4 mg via INTRAVENOUS
  Filled 2019-08-15 (×2): qty 2

## 2019-08-15 MED ORDER — ALBUMIN HUMAN 5 % IV SOLN
250.0000 mL | INTRAVENOUS | Status: AC | PRN
  Administered 2019-08-15 (×2): 12.5 g via INTRAVENOUS

## 2019-08-15 MED ORDER — CHLORHEXIDINE GLUCONATE 0.12% ORAL RINSE (MEDLINE KIT)
15.0000 mL | Freq: Two times a day (BID) | OROMUCOSAL | Status: DC
  Administered 2019-08-15 – 2019-08-19 (×8): 15 mL via OROMUCOSAL

## 2019-08-15 MED ORDER — SUCCINYLCHOLINE 20MG/ML (10ML) SYRINGE FOR MEDFUSION PUMP - OPTIME
INTRAMUSCULAR | Status: DC | PRN
  Administered 2019-08-15: 200 mg via INTRAVENOUS

## 2019-08-15 MED ORDER — MIDAZOLAM HCL 5 MG/5ML IJ SOLN
INTRAMUSCULAR | Status: DC | PRN
  Administered 2019-08-15: 1 mg via INTRAVENOUS
  Administered 2019-08-15 (×4): 2 mg via INTRAVENOUS
  Administered 2019-08-15: 1 mg via INTRAVENOUS

## 2019-08-15 MED ORDER — SODIUM CHLORIDE 0.9% FLUSH
3.0000 mL | Freq: Two times a day (BID) | INTRAVENOUS | Status: DC
  Administered 2019-08-16 – 2019-08-30 (×14): 3 mL via INTRAVENOUS

## 2019-08-15 MED ORDER — BISACODYL 5 MG PO TBEC
10.0000 mg | DELAYED_RELEASE_TABLET | Freq: Every day | ORAL | Status: DC
  Administered 2019-09-07: 10 mg via ORAL
  Filled 2019-08-15 (×3): qty 2

## 2019-08-15 MED ORDER — TRAMADOL HCL 50 MG PO TABS: 50.0000 mg | ORAL_TABLET | ORAL | Status: DC | PRN

## 2019-08-15 MED ORDER — DEXMEDETOMIDINE HCL IN NACL 400 MCG/100ML IV SOLN: 0.0000 ug/kg/h | INTRAVENOUS | Status: DC

## 2019-08-15 MED ORDER — NITROGLYCERIN IN D5W 200-5 MCG/ML-% IV SOLN: 7.0000 ug/min | INTRAVENOUS | Status: DC

## 2019-08-15 MED ORDER — ROCURONIUM BROMIDE 100 MG/10ML IV SOLN
INTRAVENOUS | Status: DC | PRN
  Administered 2019-08-15: 100 mg via INTRAVENOUS
  Administered 2019-08-15 (×4): 50 mg via INTRAVENOUS

## 2019-08-15 MED ORDER — SODIUM CHLORIDE 0.9% IV SOLUTION: Freq: Once | INTRAVENOUS | Status: DC

## 2019-08-15 MED ORDER — PHENYLEPHRINE HCL-NACL 20-0.9 MG/250ML-% IV SOLN
0.0000 ug/min | INTRAVENOUS | Status: DC
  Administered 2019-08-17: 30 ug/min via INTRAVENOUS
  Filled 2019-08-15: qty 250

## 2019-08-15 MED ORDER — PANTOPRAZOLE SODIUM 40 MG PO TBEC: 40.0000 mg | DELAYED_RELEASE_TABLET | Freq: Every day | ORAL | Status: DC

## 2019-08-15 MED ORDER — BUPIVACAINE LIPOSOME 1.3 % IJ SUSP
20.0000 mL | Freq: Once | INTRAMUSCULAR | Status: DC
  Filled 2019-08-15: qty 20

## 2019-08-15 MED ORDER — PROPOFOL 1000 MG/100ML IV EMUL
5.0000 ug/kg/min | INTRAVENOUS | Status: DC
  Administered 2019-08-16: 20 ug/kg/min via INTRAVENOUS
  Administered 2019-08-16: 50 ug/kg/min via INTRAVENOUS
  Administered 2019-08-16 (×2): 35 ug/kg/min via INTRAVENOUS
  Administered 2019-08-17 (×2): 15 ug/kg/min via INTRAVENOUS
  Administered 2019-08-17: 40 ug/kg/min via INTRAVENOUS
  Administered 2019-08-18 (×2): 20 ug/kg/min via INTRAVENOUS
  Filled 2019-08-15 (×2): qty 100
  Filled 2019-08-15: qty 200
  Filled 2019-08-15 (×3): qty 100
  Filled 2019-08-15: qty 200
  Filled 2019-08-15 (×2): qty 100

## 2019-08-15 MED ORDER — SODIUM CHLORIDE 0.9 % IV SOLN: 10.0000 mL/h | Freq: Once | INTRAVENOUS | Status: DC

## 2019-08-15 MED ORDER — OXYCODONE HCL 5 MG PO TABS: 5.0000 mg | ORAL_TABLET | ORAL | Status: DC | PRN

## 2019-08-15 MED ORDER — CHLORHEXIDINE GLUCONATE CLOTH 2 % EX PADS
6.0000 | MEDICATED_PAD | Freq: Every day | CUTANEOUS | Status: DC
  Administered 2019-08-15 – 2019-09-08 (×24): 6 via TOPICAL

## 2019-08-15 MED ORDER — PROPOFOL 10 MG/ML IV BOLUS
INTRAVENOUS | Status: AC
  Filled 2019-08-15: qty 40

## 2019-08-15 MED ORDER — ISOSORBIDE DINITRATE 10 MG PO TABS
5.0000 mg | ORAL_TABLET | Freq: Three times a day (TID) | ORAL | Status: DC
  Administered 2019-08-16 – 2019-08-17 (×4): 5 mg via ORAL
  Filled 2019-08-15 (×8): qty 1

## 2019-08-15 MED ORDER — MILRINONE LACTATE IN DEXTROSE 20-5 MG/100ML-% IV SOLN
0.1250 ug/kg/min | INTRAVENOUS | Status: DC
  Administered 2019-08-15 – 2019-08-16 (×2): 0.25 ug/kg/min via INTRAVENOUS
  Administered 2019-08-16: 0.375 ug/kg/min via INTRAVENOUS
  Administered 2019-08-16 – 2019-08-17 (×3): 0.25 ug/kg/min via INTRAVENOUS
  Administered 2019-08-18: 0.125 ug/kg/min via INTRAVENOUS
  Filled 2019-08-15 (×7): qty 100

## 2019-08-15 MED ORDER — FENTANYL CITRATE (PF) 250 MCG/5ML IJ SOLN
INTRAMUSCULAR | Status: AC
  Filled 2019-08-15: qty 25

## 2019-08-15 MED ORDER — FAMOTIDINE IN NACL 20-0.9 MG/50ML-% IV SOLN
20.0000 mg | Freq: Two times a day (BID) | INTRAVENOUS | Status: AC
  Administered 2019-08-15 (×2): 20 mg via INTRAVENOUS
  Filled 2019-08-15 (×2): qty 50

## 2019-08-15 MED ORDER — POTASSIUM CHLORIDE 10 MEQ/50ML IV SOLN: 10.0000 meq | INTRAVENOUS | Status: AC

## 2019-08-15 MED ORDER — SODIUM CHLORIDE 0.9% FLUSH: 10.0000 mL | INTRAVENOUS | Status: DC | PRN

## 2019-08-15 MED ORDER — SODIUM CHLORIDE 0.9% FLUSH
10.0000 mL | Freq: Two times a day (BID) | INTRAVENOUS | Status: DC
  Administered 2019-08-15 – 2019-08-25 (×13): 10 mL
  Administered 2019-08-28: 40 mL
  Administered 2019-08-28 – 2019-08-29 (×2): 20 mL
  Administered 2019-08-30 – 2019-08-31 (×2): 10 mL
  Administered 2019-09-01: 40 mL
  Administered 2019-09-01 – 2019-09-12 (×15): 10 mL
  Administered 2019-09-13: 20 mL
  Administered 2019-09-13 – 2019-09-18 (×6): 10 mL

## 2019-08-15 SURGICAL SUPPLY — 108 items
ADAPTER CARDIO PERF ANTE/RETRO (ADAPTER) ×4 IMPLANT
APPLICATOR TIP COSEAL (VASCULAR PRODUCTS) ×8 IMPLANT
BAG DECANTER FOR FLEXI CONT (MISCELLANEOUS) ×4 IMPLANT
BANDAGE ESMARK 6X9 LF (GAUZE/BANDAGES/DRESSINGS) ×3 IMPLANT
BASKET HEART (ORDER IN 25'S) (MISCELLANEOUS) ×4
BASKET HEART (ORDER IN 25S) (MISCELLANEOUS) ×3 IMPLANT
BENZOIN TINCTURE PRP APPL 2/3 (GAUZE/BANDAGES/DRESSINGS) ×8 IMPLANT
BLADE CLIPPER SURG (BLADE) ×8 IMPLANT
BLADE STERNUM SYSTEM 6 (BLADE) ×4 IMPLANT
BLADE SURG 15 STRL LF DISP TIS (BLADE) ×3 IMPLANT
BLADE SURG 15 STRL SS (BLADE) ×4
BNDG ELASTIC 4X5.8 VLCR STR LF (GAUZE/BANDAGES/DRESSINGS) ×4 IMPLANT
BNDG ELASTIC 6X5.8 VLCR STR LF (GAUZE/BANDAGES/DRESSINGS) IMPLANT
BNDG ESMARK 6X9 LF (GAUZE/BANDAGES/DRESSINGS) ×4
BNDG GAUZE ELAST 4 BULKY (GAUZE/BANDAGES/DRESSINGS) ×4 IMPLANT
CANISTER SUCT 3000ML PPV (MISCELLANEOUS) ×4 IMPLANT
CATH CPB KIT HENDRICKSON (MISCELLANEOUS) ×4 IMPLANT
CATH ROBINSON RED A/P 18FR (CATHETERS) ×8 IMPLANT
CLIP RETRACTION 3.0MM CORONARY (MISCELLANEOUS) ×4 IMPLANT
DERMABOND ADVANCED (GAUZE/BANDAGES/DRESSINGS) ×1
DERMABOND ADVANCED .7 DNX12 (GAUZE/BANDAGES/DRESSINGS) ×3 IMPLANT
DRAIN CHANNEL 28F RND 3/8 FF (WOUND CARE) ×12 IMPLANT
DRAPE CARDIOVASCULAR INCISE (DRAPES) ×4
DRAPE EXTREMITY T 121X128X90 (DISPOSABLE) ×4 IMPLANT
DRAPE HALF SHEET 40X57 (DRAPES) ×4 IMPLANT
DRAPE SLUSH/WARMER DISC (DRAPES) IMPLANT
DRAPE SRG 135X102X78XABS (DRAPES) ×3 IMPLANT
DRAPE STERI IOBAN 125X83 (DRAPES) ×4 IMPLANT
DRSG AQUACEL AG ADV 3.5X14 (GAUZE/BANDAGES/DRESSINGS) ×4 IMPLANT
ELECT CAUTERY BLADE 6.4 (BLADE) ×4 IMPLANT
ELECT REM PT RETURN 9FT ADLT (ELECTROSURGICAL) ×8
ELECTRODE REM PT RTRN 9FT ADLT (ELECTROSURGICAL) ×6 IMPLANT
FELT TEFLON 1X6 (MISCELLANEOUS) ×4 IMPLANT
GAUZE SPONGE 4X4 12PLY STRL (GAUZE/BANDAGES/DRESSINGS) ×8 IMPLANT
GLOVE BIO SURGEON STRL SZ 6.5 (GLOVE) ×8 IMPLANT
GLOVE BIO SURGEON STRL SZ8 (GLOVE) ×12 IMPLANT
GLOVE BIOGEL PI IND STRL 6 (GLOVE) ×12 IMPLANT
GLOVE BIOGEL PI IND STRL 6.5 (GLOVE) ×3 IMPLANT
GLOVE BIOGEL PI IND STRL 8 (GLOVE) ×6 IMPLANT
GLOVE BIOGEL PI IND STRL 8.5 (GLOVE) ×6 IMPLANT
GLOVE BIOGEL PI INDICATOR 6 (GLOVE) ×4
GLOVE BIOGEL PI INDICATOR 6.5 (GLOVE) ×1
GLOVE BIOGEL PI INDICATOR 8 (GLOVE) ×2
GLOVE BIOGEL PI INDICATOR 8.5 (GLOVE) ×2
GLOVE NEODERM STRL 7.5  LF PF (GLOVE) ×9
GLOVE NEODERM STRL 7.5 LF PF (GLOVE) ×9 IMPLANT
GLOVE SURG NEODERM 7.5  LF PF (GLOVE) ×3
GOWN STRL REUS W/ TWL LRG LVL3 (GOWN DISPOSABLE) ×30 IMPLANT
GOWN STRL REUS W/TWL LRG LVL3 (GOWN DISPOSABLE) ×40
HEMOSTAT HEMOBLAST BELLOWS (HEMOSTASIS) ×4 IMPLANT
HEMOSTAT POWDER SURGIFOAM 1G (HEMOSTASIS) ×8 IMPLANT
KIT BASIN OR (CUSTOM PROCEDURE TRAY) ×4 IMPLANT
KIT SUCTION CATH 14FR (SUCTIONS) ×4 IMPLANT
KIT TURNOVER KIT B (KITS) ×4 IMPLANT
KIT VASOVIEW HEMOPRO 2 VH 4000 (KITS) IMPLANT
MARKER GRAFT CORONARY BYPASS (MISCELLANEOUS) ×12 IMPLANT
NEEDLE 18GX1X1/2 (RX/OR ONLY) (NEEDLE) ×4 IMPLANT
NS IRRIG 1000ML POUR BTL (IV SOLUTION) ×20 IMPLANT
PACK E OPEN HEART (SUTURE) ×4 IMPLANT
PACK OPEN HEART (CUSTOM PROCEDURE TRAY) ×4 IMPLANT
PACK SPY-PHI (KITS) ×4 IMPLANT
PAD ARMBOARD 7.5X6 YLW CONV (MISCELLANEOUS) ×8 IMPLANT
PAD ELECT DEFIB RADIOL ZOLL (MISCELLANEOUS) ×4 IMPLANT
PENCIL BUTTON HOLSTER BLD 10FT (ELECTRODE) ×4 IMPLANT
POSITIONER HEAD DONUT 9IN (MISCELLANEOUS) ×4 IMPLANT
POWDER SURGICEL 3.0 GRAM (HEMOSTASIS) ×4 IMPLANT
PUNCH AORTIC ROTATE 4.0MM (MISCELLANEOUS) ×4 IMPLANT
SEALANT SURG COSEAL 8ML (VASCULAR PRODUCTS) ×4 IMPLANT
SET CARDIOPLEGIA MPS 5001102 (MISCELLANEOUS) ×4 IMPLANT
SHEARS HARMONIC 9CM CVD (BLADE) ×4 IMPLANT
SPONGE LAP 18X18 RF (DISPOSABLE) ×12 IMPLANT
STAPLER VISISTAT 35W (STAPLE) ×4 IMPLANT
SUT BONE WAX W31G (SUTURE) ×4 IMPLANT
SUT MNCRL AB 3-0 PS2 18 (SUTURE) ×16 IMPLANT
SUT PDS AB 1 CTX 36 (SUTURE) ×8 IMPLANT
SUT PROLENE 2 0 MH 48 (SUTURE) ×8 IMPLANT
SUT PROLENE 3 0 SH DA (SUTURE) ×4 IMPLANT
SUT PROLENE 4 0 SH DA (SUTURE) ×4 IMPLANT
SUT PROLENE 5 0 C 1 36 (SUTURE) ×4 IMPLANT
SUT PROLENE 6 0 C 1 30 (SUTURE) ×12 IMPLANT
SUT PROLENE 8 0 BV175 6 (SUTURE) ×12 IMPLANT
SUT PROLENE BLUE 7 0 (SUTURE) ×4 IMPLANT
SUT SILK  1 MH (SUTURE) ×4
SUT SILK 1 MH (SUTURE) ×3 IMPLANT
SUT SILK 2 0 SH CR/8 (SUTURE) IMPLANT
SUT SILK 3 0 SH CR/8 (SUTURE) IMPLANT
SUT STEEL 6MS V (SUTURE) IMPLANT
SUT STEEL SZ 6 DBL 3X14 BALL (SUTURE) IMPLANT
SUT VIC AB 2-0 CT1 27 (SUTURE) ×8
SUT VIC AB 2-0 CT1 TAPERPNT 27 (SUTURE) ×6 IMPLANT
SUT VIC AB 2-0 CTX 27 (SUTURE) IMPLANT
SUT VIC AB 3-0 X1 27 (SUTURE) IMPLANT
SYR 10ML LL (SYRINGE) ×4 IMPLANT
SYR 20ML LL LF (SYRINGE) ×8 IMPLANT
SYR 30ML LL (SYRINGE) ×4 IMPLANT
SYR 3ML LL SCALE MARK (SYRINGE) ×4 IMPLANT
SYSTEM SAHARA CHEST DRAIN ATS (WOUND CARE) ×4 IMPLANT
TAPE CLOTH SURG 4X10 WHT LF (GAUZE/BANDAGES/DRESSINGS) ×4 IMPLANT
TAPE PAPER 3X10 WHT MICROPORE (GAUZE/BANDAGES/DRESSINGS) ×4 IMPLANT
TOWEL GREEN STERILE (TOWEL DISPOSABLE) ×4 IMPLANT
TOWEL GREEN STERILE FF (TOWEL DISPOSABLE) ×4 IMPLANT
TRAY FOLEY SLVR 16FR TEMP STAT (SET/KITS/TRAYS/PACK) ×4 IMPLANT
TUBE CONNECTING 12X1/4 (SUCTIONS) ×4 IMPLANT
TUBING LAP HI FLOW INSUFFLATIO (TUBING) IMPLANT
UNDERPAD 30X30 (UNDERPADS AND DIAPERS) ×4 IMPLANT
WATER STERILE IRR 1000ML POUR (IV SOLUTION) ×8 IMPLANT
WATER STERILE IRR 1000ML UROMA (IV SOLUTION) IMPLANT
YANKAUER SUCT BULB TIP NO VENT (SUCTIONS) ×8 IMPLANT

## 2019-08-15 NOTE — Anesthesia Postprocedure Evaluation (Signed)
Anesthesia Post Note  Patient: Christian [name]  Procedure(s) Performed: CORONARY ARTERY BYPASS GRAFTING (CABG), x 4 using bilateral IMAs and left radial artery .  LIMA TO LAD, RIMA TO PDA, RADIAL ARTERY TO CIRC AND SEQUENTIALLY TO OM1. (N/A Chest) TRANSESOPHAGEAL ECHOCARDIOGRAM (TEE) (N/A ) INDOCYANINE GREEN FLUORESCENCE IMAGING (ICG) (N/A ) Radial Artery Harvest (Left Arm Lower)     Patient location during evaluation: SICU Anesthesia Type: General Level of consciousness: sedated Pain management: pain level controlled Vital Signs Assessment: post-procedure vital signs reviewed and stable Respiratory status: patient remains intubated per anesthesia plan Cardiovascular status: stable Postop Assessment: no apparent nausea or vomiting Anesthetic complications: no    Last Vitals:  Vitals:   08/15/19 0500 08/15/19 1408  BP: (!) 148/68 (!) 99/53  Pulse: (!) 101 94  Resp:  20  Temp: 36.9 C   SpO2: 98% 100%    Last Pain:  Vitals:   08/15/19 0500  TempSrc: Oral  PainSc:                  Icess Bertoni,W. EDMOND

## 2019-08-15 NOTE — Plan of Care (Signed)
  Problem: Education: Goal: Knowledge of General Education information will improve Description: Including pain rating scale, medication(s)/side effects and non-pharmacologic comfort measures Outcome: Progressing   Problem: Health Behavior/Discharge Planning: Goal: Ability to manage health-related needs will improve Outcome: Progressing   Problem: Clinical Measurements: Goal: Ability to maintain clinical measurements within normal limits will improve Outcome: Progressing Goal: Will remain free from infection Outcome: Progressing Goal: Diagnostic test results will improve Outcome: Progressing Goal: Respiratory complications will improve Outcome: Progressing Goal: Cardiovascular complication will be avoided Outcome: Progressing   Problem: Activity: Goal: Risk for activity intolerance will decrease Outcome: Progressing   Problem: Nutrition: Goal: Adequate nutrition will be maintained Outcome: Progressing   Problem: Coping: Goal: Level of anxiety will decrease Outcome: Progressing   Problem: Elimination: Goal: Will not experience complications related to bowel motility Outcome: Progressing Goal: Will not experience complications related to urinary retention Outcome: Progressing   Problem: Pain Managment: Goal: General experience of comfort will improve Outcome: Progressing   Problem: Safety: Goal: Ability to remain free from injury will improve Outcome: Progressing   Problem: Skin Integrity: Goal: Risk for impaired skin integrity will decrease Outcome: Progressing   Problem: Education: Goal: Will demonstrate proper wound care and an understanding of methods to prevent future damage Outcome: Progressing Goal: Knowledge of disease or condition will improve Outcome: Progressing Goal: Knowledge of the prescribed therapeutic regimen will improve Outcome: Progressing Goal: Individualized Educational Video(s) Outcome: Progressing   Problem: Activity: Goal: Risk for  activity intolerance will decrease Outcome: Progressing   Problem: Cardiac: Goal: Will achieve and/or maintain hemodynamic stability Outcome: Progressing   Problem: Clinical Measurements: Goal: Postoperative complications will be avoided or minimized Outcome: Progressing   Problem: Respiratory: Goal: Respiratory status will improve Outcome: Progressing   Problem: Skin Integrity: Goal: Wound healing without signs and symptoms of infection Outcome: Progressing Goal: Risk for impaired skin integrity will decrease Outcome: Progressing   Problem: Urinary Elimination: Goal: Ability to achieve and maintain adequate renal perfusion and functioning will improve Outcome: Progressing   

## 2019-08-15 NOTE — Anesthesia Procedure Notes (Signed)
Procedure Name: Intubation Date/Time: 08/15/2019 12:00 AM Performed by: Amadeo Garnet, CRNA Pre-anesthesia Checklist: Patient identified, Emergency Drugs available, Suction available and Patient being monitored Patient Re-evaluated:Patient Re-evaluated prior to induction Oxygen Delivery Method: Circle system utilized Preoxygenation: Pre-oxygenation with 100% oxygen Induction Type: IV induction Ventilation: Mask ventilation with difficulty and Two handed mask ventilation required Laryngoscope Size: Mac and 4 Grade View: Grade II Tube type: Oral Tube size: 8.0 mm Number of attempts: 1 Airway Equipment and Method: Stylet Placement Confirmation: ETT inserted through vocal cords under direct vision,  positive ETCO2 and breath sounds checked- equal and bilateral Secured at: 23 cm Tube secured with: Tape Dental Injury: Teeth and Oropharynx as per pre-operative assessment

## 2019-08-15 NOTE — Anesthesia Preprocedure Evaluation (Addendum)
Anesthesia Evaluation  Patient identified by MRN, date of birth, ID band Patient awake    Reviewed: Allergy & Precautions, H&P , NPO status , Patient's Chart, lab work & pertinent test results  Airway Mallampati: II  TM Distance: >3 FB Neck ROM: Full    Dental no notable dental hx. (+) Teeth Intact, Dental Advisory Given   Pulmonary neg pulmonary ROS, former smoker,    Pulmonary exam normal breath sounds clear to auscultation       Cardiovascular hypertension, Pt. on medications and Pt. on home beta blockers + angina + CAD and + Past MI   Rhythm:Regular Rate:Normal     Neuro/Psych negative neurological ROS  negative psych ROS   GI/Hepatic Neg liver ROS, hiatal hernia,   Endo/Other  diabetes, Type 2, Oral Hypoglycemic Agents  Renal/GU negative Renal ROS  negative genitourinary   Musculoskeletal   Abdominal   Peds  Hematology negative hematology ROS (+)   Anesthesia Other Findings   Reproductive/Obstetrics negative OB ROS                            Anesthesia Physical Anesthesia Plan  ASA: IV  Anesthesia Plan: General   Post-op Pain Management:    Induction: Intravenous  PONV Risk Score and Plan: 2 and Ondansetron, Dexamethasone and Midazolam  Airway Management Planned: Oral ETT  Additional Equipment: Arterial line, CVP, PA Cath, TEE and Ultrasound Guidance Line Placement  Intra-op Plan:   Post-operative Plan: Post-operative intubation/ventilation  Informed Consent: I have reviewed the patients History and Physical, chart, labs and discussed the procedure including the risks, benefits and alternatives for the proposed anesthesia with the patient or authorized representative who has indicated his/her understanding and acceptance.       Plan Discussed with: CRNA  Anesthesia Plan Comments:         Anesthesia Quick Evaluation

## 2019-08-15 NOTE — Progress Notes (Addendum)
EVENING ROUNDS NOTE :     [address]       [place] [address]             [phone]                 Day of Surgery Procedure(s) (LRB): CORONARY ARTERY BYPASS GRAFTING (CABG), x 4 using bilateral IMAs and left radial artery .  LIMA TO LAD, RIMA TO PDA, RADIAL ARTERY TO CIRC AND SEQUENTIALLY TO OM1. (N/A) TRANSESOPHAGEAL ECHOCARDIOGRAM (TEE) (N/A) INDOCYANINE GREEN FLUORESCENCE IMAGING (ICG) (N/A) Radial Artery Harvest (Left)  Total Length of Stay:  LOS: 6 days  BP 108/72   Pulse (!) 106   Temp (!) 100.4 F (38 C)   Resp 20   Ht 5\' 9"  (1.753 m)   Wt 100.2 kg   SpO2 100%   BMI 32.62 kg/m   .Intake/Output      04/20 0701 - 04/21 0700   P.O.    I.V. (mL/kg) 2500 (25)   Blood 833   IV Piggyback 450   Total Intake(mL/kg) 3783 (37.8)   Urine (mL/kg/hr) 2295 (1.8)   Chest Tube 370   Total Output 2665   Net +1118         . sodium chloride 20 mL/hr at 08/15/19 1448  . sodium chloride    . [START ON 08/16/2019] sodium chloride    . sodium chloride 20 mL/hr at 08/15/19 1405  . albumin human 12.5 g (08/15/19 1457)  . cefUROXime (ZINACEF)  IV 1.5 g (08/15/19 1840)  . dexmedetomidine (PRECEDEX) IV infusion 0.7 mcg/kg/hr (08/15/19 1405)  . epinephrine Stopped (08/15/19 1405)  . famotidine (PEPCID) IV 20 mg (08/15/19 1449)  . insulin 3.6 Units/hr (08/15/19 1405)  . lactated ringers    . lactated ringers    . lactated ringers 20 mL/hr at 08/15/19 1919  . magnesium sulfate 4 g (08/15/19 1455)  . milrinone 0.25 mcg/kg/min (08/15/19 1506)  . nitroGLYCERIN 10 mcg/min (08/15/19 1450)  . phenylephrine (NEO-SYNEPHRINE) Adult infusion Stopped (08/15/19 1545)  . propofol (DIPRIVAN) infusion 5 mcg/kg/min (08/15/19 1725)  . vancomycin       Lab Results  Component Value Date   WBC 11.1 (H) 08/15/2019   HGB 10.2 (L) 08/15/2019   HCT 30.0 (L) 08/15/2019   PLT 102 (L) 08/15/2019   GLUCOSE 149 (H) 08/15/2019   CHOL 107 08/11/2019   TRIG 125 08/11/2019   HDL 42  08/11/2019   LDLDIRECT 40.0 11/23/2018   LDLCALC 40 08/11/2019   ALT 45 (H) 08/14/2019   AST 35 08/14/2019   NA 143 08/15/2019   K 4.4 08/15/2019   CL 106 08/15/2019   CREATININE 0.90 08/15/2019   BUN 5 (L) 08/15/2019   CO2 24 08/15/2019   TSH 2.590 04/19/2019   INR 1.5 (H) 08/15/2019   HGBA1C 7.7 (H) 08/14/2019   1 stable on no pressors/inotropes 2 chest not closed d/t edema 3 chest tube drainage 370 cc so far, good UOP 4 conts early post-op care   John Giovanni, PA-C   I have seen and examined the patient and agree with the assessment and plan as outlined.  Rexene Alberts, MD 08/15/2019 10:27 PM

## 2019-08-15 NOTE — Op Note (Signed)
CARDIOTHORACIC SURGERY OPERATIVE NOTE  Date of Procedure: 08/15/2019  Preoperative Diagnosis: Severe 3-vessel Coronary Artery Disease, Ischemic cardiomyopathy  Postoperative Diagnosis: Same  Procedure:    Coronary Artery Bypass Grafting x 4  Left Internal Mammary Artery to Distal Left Anterior Descending Coronary Artery; pedicled RIMA Graft to Posterior Descending Coronary Artery; left radial artery Graft to 1st Obtuse Marginal Branch and distal OM branches of Left Circumflex Coronary Artery as sequenced great; left radial artery harvesting; bilateral IMA harvesting Completion indocyanine green fluorescence imaging Multilevel rib block with exparel solution  Surgeon: B. Murvin Natal, MD  Assistant: Josie Saunders, PA-C  Anesthesia: get  Operative Findings:  Reduced left ventricular systolic function  Good quality but small size internal mammary artery conduits  Good quality radial artery conduits  good quality target vessels for grafting    BRIEF CLINICAL NOTE AND INDICATIONS FOR SURGERY  74 yo man admitted with chest pain and found to have reduced LV function. He is referred for CABG after LHC shows severe 3 V CAD. He is considered an excellent candidate for such after thorough evaluation.    DETAILS OF THE OPERATIVE PROCEDURE  Preparation:  The patient is brought to the operating room on the above mentioned date and central monitoring was established by the anesthesia team including placement of Swan-Ganz catheter and radial arterial line. The patient is placed in the supine position on the operating table.  Intravenous antibiotics are administered. General endotracheal anesthesia is induced uneventfully. A Foley catheter is placed.  Baseline transesophageal echocardiogram was performed.  Findings were notable for mildly reduced LV function.  The patient's chest, abdomen, both groins, left upper extremity and both lower extremities are prepared and draped in a sterile  manner. A time out procedure is performed.   Surgical Approach and Conduit Harvest:  A median sternotomy incision was performed and the left internal mammary artery is dissected from the chest wall and prepared for bypass grafting. The left internal mammary artery is notably good quality conduit. Simultaneously, the left radial artery is harvested with an open technique. The radial artery is notably good quality conduit. The left upper extremity surgical incision is closed in layers. Next attention is turned to the right chest wall where the RIMA is mobilized in a standard fashion.  Following systemic heparinization, both internal mammary arteries transected distally noted to have excellent flow, although both were relatively small in diameter. Multilevel rib block was performed with Exparel solution.    Extracorporeal Cardiopulmonary Bypass and Myocardial Protection:  The pericardium is opened. The ascending aorta is normal in appearance. The ascending aorta and the right atrium are cannulated for cardiopulmonary bypass.  Adequate heparinization is verified.     Cardiopulmonary bypass was begun and the surface of the heart is inspected. Distal target vessels are selected for coronary artery bypass grafting. A cardioplegia cannula is placed in the ascending aorta.   The patient is allowed to cool passively to 34C systemic temperature.  The aortic cross clamp is applied and cold blood cardioplegia is delivered initially in an antegrade fashion through the aortic root.  Iced saline slush is applied for topical hypothermia.  The initial cardioplegic arrest is rapid with early diastolic arrest.  Repeat doses of cardioplegia are administered intermittently throughout the entire cross clamp portion of the operation through the aortic root  and through subsequently placed  grafts in order to maintain completely flat electrocardiogram.   Coronary Artery Bypass Grafting:   The posterior descending branch  of the right coronary artery  was grafted using the pedicled RIMA graft in an end-to-side fashion.  At the site of distal anastomosis the target vessel was good quality and measured approximately 1.5 mm in diameter. Anastomotic patency and runoff was confirmed with indocyanine green fluorescence imaging (SPY).  The distal obtuse marginal branch of the left circumflex coronary artery was grafted using the radial artery graft in an end-to-side fashion.  At the site of distal anastomosis the target vessel was good quality and measured approximately 1.5  mm in diameter. The 1st OM was then grafted in an end-to-side fashion using the more proximal portion of the radial graft to create a sequenced anastomosis.   At the site of distal anastomosis the target vessel was good quality and measured approximately 1.5 mm in diameter. Anastomotic patency and runoff of the sequenced radial graft was confirmed with indocyanine green fluorescence imaging (SPY).  The distal left anterior coronary artery was grafted with the left internal mammary artery in an end-to-side fashion.  At the site of distal anastomosis the target vessel was good quality and measured approximately 1.5 mm in diameter. Anastomotic patency and runoff was confirmed with indocyanine green fluorescence imaging (SPY).  The proximal radial artery anastomosis was placed directly to the ascending aorta prior to removal of the aortic cross clamp.  Deairing procedures were performed and the aortic cross clamp was removed   Procedure Completion:  All proximal and distal coronary anastomoses were inspected for hemostasis and appropriate graft orientation. Epicardial pacing wires are fixed to the right ventricular outflow tract and to the right atrial appendage. The patient is rewarmed to 37C temperature. The patient is weaned and disconnected from cardiopulmonary bypass.  The patient's rhythm at separation from bypass was NSR.  The patient was weaned from  cardiopulmonary bypass without difficulty.   Followup transesophageal echocardiogram performed after separation from bypass revealed no changes from the preoperative exam.  The aortic and venous cannula were removed uneventfully. Protamine was administered to reverse the anticoagulation. The mediastinum and pleural space were inspected for hemostasis and irrigated with saline solution. The mediastinum and bilateral pleural spaces were drained using fluted chest tubes placed through separate stab incisions inferiorly.  The soft tissues anterior to the aorta were reapproximated loosely. Upon attempting to close the sternum, the patient because hypotensive and there was little room in the chest for closure due to swelling and RV compression. Therefore, the chest was left open. A spacer was placed between the two sternal halves. The defect was covered and an Horticulturist, commercial dressing and ioban.      Disposition:  The patient tolerated the procedure well and is transported to the surgical intensive care in stable condition. There are no intraoperative complications. All sponge instrument and needle counts are verified correct at completion of the operation.    Jayme Cloud, MD 08/15/2019 3:08 PM

## 2019-08-15 NOTE — OR Nursing (Signed)
20 CC SYRINGE MINUS PLUNGER LEFT IN CHEST TO HOLD STERNUM APART.

## 2019-08-15 NOTE — OR Nursing (Signed)
LEFT WRIST INCISION MADE BY D. ZIMMERMAN, PA FOR OPEN HARVESTING OF LEFT RADIAL ARTERY.

## 2019-08-15 NOTE — Anesthesia Procedure Notes (Addendum)
Arterial Line Insertion Start/End4/20/2021 7:15 AM, 08/15/2019 7:20 AM Performed by: Roderic Palau, MD, anesthesiologist  Patient location: Pre-op. Preanesthetic checklist: patient identified, IV checked, site marked, risks and benefits discussed, surgical consent, monitors and equipment checked, pre-op evaluation and timeout performed Lidocaine 1% used for infiltration and patient sedated Right, brachial was placed Catheter size: 20 G Hand hygiene performed  and maximum sterile barriers used   Attempts: 1 Procedure performed using ultrasound guided technique. Ultrasound Notes:anatomy identified, needle tip was noted to be adjacent to the nerve/plexus identified, no ultrasound evidence of intravascular and/or intraneural injection and image(s) printed for medical record Following insertion, line sutured, dressing applied and Biopatch. Patient tolerated the procedure well with no immediate complications.

## 2019-08-15 NOTE — Transfer of Care (Signed)
Immediate Anesthesia Transfer of Care Note  Patient: Christian [name]  Procedure(s) Performed: CORONARY ARTERY BYPASS GRAFTING (CABG), x 4 using bilateral IMAs and left radial artery .  LIMA TO LAD, RIMA TO PDA, RADIAL ARTERY TO CIRC AND SEQUENTIALLY TO OM1. (N/A Chest) TRANSESOPHAGEAL ECHOCARDIOGRAM (TEE) (N/A ) INDOCYANINE GREEN FLUORESCENCE IMAGING (ICG) (N/A ) Radial Artery Harvest (Left Arm Lower)  Patient Location: SICU  Anesthesia Type:General  Level of Consciousness: Patient remains intubated per anesthesia plan  Airway & Oxygen Therapy: Patient remains intubated per anesthesia plan and Patient placed on Ventilator (see vital sign flow sheet for setting)  Post-op Assessment: Report given to RN and Post -op Vital signs reviewed and stable  Post vital signs: Reviewed and stable  Last Vitals:  Vitals Value Taken Time  BP 99/53 08/15/19 1408  Temp 37.5 C 08/15/19 1430  Pulse 97 08/15/19 1430  Resp 20 08/15/19 1408  SpO2 100 % 08/15/19 1430  Vitals shown include unvalidated device data.  Last Pain:  Vitals:   08/15/19 0500  TempSrc: Oral  PainSc:          Complications: No apparent anesthesia complications

## 2019-08-15 NOTE — H&P (Signed)
History and Physical Interval Note:  08/15/2019 7:23 AM  Christian [name]  has presented today for surgery, with the diagnosis of CAD.  The various methods of treatment have been discussed with the patient and family. After consideration of risks, benefits and other options for treatment, the patient has consented to  Procedure(s): CORONARY ARTERY BYPASS GRAFTING (CABG), bilateral IMA (N/A) TRANSESOPHAGEAL ECHOCARDIOGRAM (TEE) (N/A) INDOCYANINE GREEN FLUORESCENCE IMAGING (ICG) (N/A) as a surgical intervention.  The patient's history has been reviewed, patient examined, no change in status, stable for surgery.  I have reviewed the patient's chart and labs.  Questions were answered to the patient's satisfaction.     [name]

## 2019-08-15 NOTE — OR Nursing (Signed)
CHEST INCISION MADE BY DR. Orvan Seen.

## 2019-08-15 NOTE — Brief Op Note (Addendum)
08/09/2019 - 08/15/2019  12:29 PM  PATIENT:  Christian [name]  75 y.o. male  PRE-OPERATIVE DIAGNOSIS:  1. S/p NSTEMI 2. CAD 3. ISCHEMIC CARDIOMYOPATHY  POST-OPERATIVE DIAGNOSIS:  1. S/p NSTEMI 2. CAD 3.ISCHEMIC CARDIOMYOPATHY  PROCEDURE:  TRANSESOPHAGEAL ECHOCARDIOGRAM (TEE), CORONARY ARTERY BYPASS GRAFTING (CABG), x 4  (LIMA TO LAD,  LEFT RADIAL ARTERY SEQUENTIALLY to DISTAL CIRC AND OM1, RIMA TO PDA) with bilateral IMAs and OPEN LEFT RADIAL ARTERY HARVEST, INDOCYANINE GREEN FLUORESCENCE IMAGING (ICG)   SURGEON:  Surgeon(s) and Role:    [name], MD - Primary  PHYSICIAN ASSISTANT: Lars Pinks PA-C  ASSISTANTS: Vernie Murders RNFA  ANESTHESIA:   general  EBL:  Per anesthesia and perfusion record   DRAINS: Chest tubes placed in the mediastinal and pleural spaces   LOCAL MEDICATIONS USED:  OTHER Exparel  COUNTS CORRECT:  YES  DICTATION: .Dragon Dictation  PLAN OF CARE: Admit to inpatient   PATIENT DISPOSITION:  ICU - intubated and hemodynamically stable.   Delay start of Pharmacological VTE agent (>24hrs) due to surgical blood loss or risk of bleeding: yes  BASELINE WEIGHT: 100.2 kg

## 2019-08-15 NOTE — Progress Notes (Signed)
Pt with 8.0 ETT at 25cm at the lip secured with pink tape. Pink tape removed and Hollister tube holder placed on pt. ETT resecured at 25cm.

## 2019-08-15 NOTE — Anesthesia Procedure Notes (Signed)
Central Venous Catheter Insertion Performed by: Roderic Palau, MD, anesthesiologist Start/End4/20/2021 6:55 AM, 08/15/2019 7:05 AM Patient location: Pre-op. Preanesthetic checklist: patient identified, IV checked, site marked, risks and benefits discussed, surgical consent, monitors and equipment checked, pre-op evaluation, timeout performed and anesthesia consent Hand hygiene performed  and maximum sterile barriers used  PA cath was placed.Swan type:thermodilution PA Cath depth:50 Procedure performed without using ultrasound guided technique. Attempts: 1 Patient tolerated the procedure well with no immediate complications.

## 2019-08-15 NOTE — Discharge Summary (Deleted)
Physician Discharge Summary       [address]       ,[place] [id number]             [phone]    Patient ID: Christian [name] MRN: [id number] DOB/AGE: [birth year] 74 y.o.  Admit date: 08/09/2019 Discharge date: 10/16/2019  Admission Diagnoses: 1. S/p NSTEMI 2. Ischemic cardiomyopathy 3. Coronary artery disease  Discharge Diagnoses:  1. S/p CABG x 4 2. AKI (acute kidney injury) ([place]) 3. Expected post op blood loss anemia 4. History of HLD (hyperlipidemia) 5. History of HTN (hypertension) 6. History of benign prostatic hyperplasia 7. History of diabetes mellitus with polyneuropathy ([place]) 8. History of pulmonary embolism ([place])  Consults: cardiology  Procedure (s):    Coronary Artery Bypass Grafting x 4             Left Internal Mammary Artery to Distal Left Anterior Descending Coronary Artery; pedicled RIMA Graft to Posterior Descending Coronary Artery; left radial artery Graft to 1st Obtuse Marginal Branch and distal OM branches of Left Circumflex Coronary Artery as sequenced great; left radial artery harvesting; bilateral IMA harvesting  Completion indocyanine green fluorescence imaging Multilevel rib block with exparel solution  Procedure(s) and Anesthesia Type:    * Chest Closure S/P CABG WITH APPLICATION OF PREVENA  INCISIONAL WOUND VAC - General  History of Presenting Illness: [name] is a 74 yo AA male with known history CAD, HTN, Hyperlipidemia, and DM, and BPH.  His CAD dates back to 2015 at which time he underwent stent placement to his PDA.  In follow up with Dr. Einar Gip on 06/01/2019 he had complaints or shortness of breath and mild fatigue.  He denied chest pain at that time.  He was started on Spironolactone during that visit, but due to an elevation in his creatinine this had to be discontinued.  The patient developed complaints of chest pain.  His previous symptoms of shortness of breath and fatigue had progressed and he also developed lower  extremity edema.  He presented to his PCP with above mentioned complaints.  She recommended the patient presented to the ED for evaluation.  On 4/14 work up in the ED consisted of CT scan which showed a right lower lobe pulmonary embolism.  EKG was obtained and showed non-specific ST-T wave changes.  Troponin levels were also positive.  He was ruled in for NSTEMI and Cardiology consult was requested for admission.  He was evaluated by Dr. Einar Gip who admitted patient for further care.  He was taken to the catheterization lab on 08/10/2019 and was found to have CAD.  It was felt coronary bypass grafting would be indicated and TCTS consult was requested.    Brief Hospital Course:   At the time of exam, the patient is having episodic chest pain.  It has responded to NTG drip and SL NTG.  The patient had previously been fairly active until recently when his above mentioned symptoms have limited his activity level.  Dr. Orvan Seen discussed the need for coronary artery bypass grafting surgery. Potential risks, benefits, and complications of the surgery were discussed with the patient and he agreed to proceed with surgery. Pre operative carotid duplex US showed no significant internal carotid artery stenosis bilaterally. Patient underwent a CABG x 4 on 08/15/2019.  He tolerated the procedure, but due to edema his chest was unable to be closed.  He was taken the SICU in stable condition.  He required transfusion of 2 units of packed cells  for expected post operative blood loss anemia.  He was started on Lasix gtt to help facilitate diuresis.  The patients edema improved and he was taken back to the operating room on 08/16/2018 for sternal closure with application of a Pravena wound vac.  The patient was weaned off Levophed and Milrinone as hemodynamics allowed.  He developed Atrial Flutter and was treated with IV Amiodarone.  He also required addition of Neo-synephrine for hypotension.  He also underwent bedside cardioversion  with successful restoration of NSR.  Advance heart failure team was consulted for post surgical volume management and systolic heart failure.  He was weaned and extubated on 08/18/2019.  Unfortunately later that afternoon the patient suffered a cardiac arrest.  CPR was performed and the patient was re-intubated.  He underwent emergent opening of his chest in the ICU with internal cardiac massage.  There was a large clot removed with improvement of hemodynamics.  During the procedure the patient was transfused 2 units of packed cells.  His chest was left open at that time.  Advanced heart failure was consulted and aided in management of patient's pressor support.  The patient developed AKI with creatinine level peaking at 5.27.  He was started on a lasix drip to facilitate urine output.  Nephrology consult was obtained and patient was initiated on CRRT.  He developed shock liver and statin therapy was discontinued.  He stabilized and was taken back to the operating room on 08/21/2019, 08/24/2019 for sternal washout.  His chest was closed with assistance of a wound vac.  He was weaned off all pressor support by 08/23/2019.  He developed abdominal distention.  He was felt to have an Ileus and an NG tube was placed.  He had brown output and there was some concern for aspiration.  He was on ABX due to his chest being open.  The patient remained on a ventilator and was sedated.  He was taken back to the operating room on 08/29/2019.  He underwent sternal washout with replacement of wound vac and Tracheostomy.  He continued to have issues with hypotension.  He required additional titration of Levophed.  His hemoglobin level dropped without acute signs of bleeding.  He was transfused 1 unit of packed cells.  He was taken off heparin.  He made some progress hemodynamically over the next several days.  His levophed was discontinued on 09/02/2019.  He was taken back to the operating room on 5/10 for sternal washout.  His chest was  unable to be closed.  It was felt the patient would require a flap closure.  General surgery and Plastic surgery were consulted for this.  He was taken back to the operating room on 5/12 and underwent Mediastinal exploration with omental flap to anterior mediastinum, sternal plating, and right pectoralis muscle advancement flap, excisional skin debridement and application of wound vac.  The patient tolerated the procedure without difficulty and was taken back to the SICU in stable condition.  The patient developed decreased movement on his left side.  Head CT was obtained on 5/13 and 5/15 neither of which showed an acute ischemic injury. Due to sternal plating he was unable to have an MRI.  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  They recommended Heparin therapy this was started on 5/19.  Unfortunately the morning of 5/20 he developed an acute hematoma of his previous sternal incision with decompensation.  He was taken emergently to the operating  for mediastinal exploration.  He required transfusion of several units of packed cells.  The patient tolerated the procedure without difficulty.  The patient has progressed since surgery.  He has been weaned off the ventilator.  His trach collar was decreased in size and ultimately removed on 09/29/2019.  He is working with SLP to progress to [place] and diet advancement as able.  Swallow study showed significant improvement.  Most recent swallow study dated 10/13/2019 shows no aspiration and speech has recommended a regular texture diet with thin liquids at this time.  He underwent IR placement of a Gastrostomy tube for nutritional feedings.  His JP drains have been removed from his sternal wound.  He has permanent dialysis access in place.  His trach has been decannulated and he is tolerating this well.  Unfortunately he remains weak and deconditioned.  He is not able to participate in an intense  inpatient rehabilitation program at this time.  It is felt patient will require LTAC placement. However, they have no dialysis beds available.  He therefore will require SNF placement.  He remains hemodynamically stable.  He is tolerating a T/TH/S dialysis schedule.  His wounds are healing without evidence of infection with the exception of his radial artery harvest site which has a superficial dehiscence in the lower arm which is now being packed with wet-to-dry saline dressings.  He was also debrided at the bedside of some fatty necrotic tissue.  There is no current evidence of infection at this time.Marland Kitchen  He is medically stable for discharge today to the nursing facility.  Latest Vital Signs: Blood pressure 139/77, pulse (!) 104, temperature 97.6 F (36.4 C), temperature source Oral, resp. rate 18, height '5\' 9"'$  (1.753 m), weight 86.7 kg, SpO2 100 %.  Physical Exam:  General appearance: alert, cooperative and no distress Heart: regular rate and rhythm Lungs: clear to auscultation bilaterally Abdomen: soft, non-tender; bowel sounds normal; no masses,  no organomegaly Extremities: extremities normal, atraumatic, no cyanosis or edema Wound: clean and dry radial site, sternotomy C/D/I, J tube site clean  Discharge Condition: Stable and discharged to SNF  Recent laboratory studies:  Lab Results  Component Value Date   WBC 14.2 (H) 10/14/2019   HGB 9.2 (L) 10/14/2019   HCT 30.2 (L) 10/14/2019   MCV 101.3 (H) 10/14/2019   PLT 377 10/14/2019   Lab Results  Component Value Date   NA 131 (L) 10/16/2019   K 3.9 10/16/2019   CL 94 (L) 10/16/2019   CO2 24 10/16/2019   CREATININE 5.27 (H) 10/16/2019   GLUCOSE 84 10/16/2019      Diagnostic Studies: CT ABDOMEN WO CONTRAST  Result Date: 09/28/2019 CLINICAL DATA:  Evaluation for possible percutaneous gastrostomy tube placement. EXAM: CT ABDOMEN WITHOUT CONTRAST TECHNIQUE: Multidetector CT imaging of the abdomen was performed following the standard  protocol without IV contrast. COMPARISON:  CT of the chest on 09/18/2019 FINDINGS: Lower chest: Atelectasis at the posterior left lung base. Hepatobiliary: No focal liver abnormality is seen. No gallstones, gallbladder wall thickening, or biliary dilatation. Pancreas: Unremarkable. No pancreatic ductal dilatation or surrounding inflammatory changes. Spleen: Normal in size without focal abnormality. Adrenals/Urinary Tract: Stable hyperplastic and nodular appearance of the adrenal glands bilaterally. Cyst of the lateral mid left kidney demonstrates internal simple fluid density and measures approximately 2.6 cm. No hydronephrosis or renal calculi identified. Stomach/Bowel: No evidence of hiatal hernia. Feeding tube in place that extends into the proximal jejunum. Gastric positioning normal with some of the stomach located posterior  to the left lobe of the liver when decompressed. The stomach does lie superior to the transverse colon. No evidence of bowel obstruction, ileus, inflammation or free air. Vascular/Lymphatic: No significant vascular findings are present. No enlarged abdominal lymph nodes. Other: No ascites, body wall edema or focal fluid collections. No focal hernias. Musculoskeletal: No acute or significant osseous findings. IMPRESSION: 1. Gastric positioning normal with some of the stomach located posterior to the left lobe of the liver when decompressed. There would likely be an adequate window for percutaneous gastrostomy tube placement after air insufflation of the stomach. 2. Stable hyperplastic and nodular appearance of the adrenal glands bilaterally. 3. Atelectasis at the posterior left lung base. Electronically Signed   By: Aletta Edouard M.D.   On: 09/28/2019 16:50   DG Chest 2 View  Result Date: 10/08/2019 CLINICAL DATA:  Chest pain EXAM: CHEST - 2 VIEW COMPARISON:  10/05/2019 FINDINGS: Cardiac shadow remains enlarged. Postsurgical changes are noted. Tracheostomy tube is been removed in the  interval. Left jugular dialysis catheter is again noted and stable. The right lung is well aerated without focal infiltrate. Minimal left pleural effusion is noted stable from the prior study. IMPRESSION: Stable small left pleural effusion. No new acute abnormality noted. Electronically Signed   By: Inez Catalina M.D.   On: 10/08/2019 15:27   DG Chest 2 View  Result Date: 10/05/2019 CLINICAL DATA:  Postoperative follow-up. EXAM: CHEST - 2 VIEW COMPARISON:  Sep 23, 2019 FINDINGS: Multiple sternal fixation plates and screws are seen. There is stable tracheostomy tube and left-sided venous catheter positioning. The nasogastric tube seen on the prior study has been removed. There is no evidence of acute infiltrate or pneumothorax. A small left pleural effusion is seen. The heart size and mediastinal contours are within normal limits. The visualized skeletal structures are unremarkable. IMPRESSION: Postoperative changes, as described above, with a small left pleural effusion. Electronically Signed   By: Virgina Norfolk M.D.   On: 10/05/2019 17:37   CT CHEST WO CONTRAST  Result Date: 09/18/2019 CLINICAL DATA:  Chest pain, shortness of breath, pleural effusion at, history of coronary artery disease post CABG, diabetes mellitus, hypertension EXAM: CT CHEST WITHOUT CONTRAST TECHNIQUE: Multidetector CT imaging of the chest was performed following the standard protocol without IV contrast. Sagittal and coronal MPR images reconstructed from axial data set. COMPARISON:  08/09/2019 FINDINGS: Cardiovascular: Atherosclerotic calcifications aorta and coronary arteries. Postsurgical changes of CABG. LEFT central venous catheter with tip at SVC. Enlargement of cardiac chambers. Minimal pericardial effusion. Aorta normal caliber. Mediastinum/Nodes: Tip of tracheostomy tube above carina. Nasogastric tube extends into stomach. Two surgical drains in the anterior chest wall. Esophagus unremarkable. 6 mm RIGHT thyroid nodule;  BILATERAL pleural effusions and scattered atelectasis greatest in LEFT lower lobe. No definite infiltrate or pneumothorax. (Ref: J Am Coll Radiol. 2015 Feb;12(2): 143-50). Base of cervical region otherwise normal appearance. Stranding of the anterior mediastinum likely related to prior cardiac surgery and sternal dehiscence, with herniation of fat through the sternotomy. No thoracic adenopathy. Lungs/Pleura: BILATERAL pleural effusions, small. Scattered atelectasis in both lungs greatest LEFT lower lobe. Pleural calcifications in hemithoraces bilaterally likely reflects asbestos exposure. No infiltrate or pneumothorax. Upper Abdomen: Diffuse somewhat nodular thickening of the adrenal glands bilaterally likely representing multiple small nodules question adenomas as noted on prior exam. High attenuation material within the gallbladder question vicarious excretion of prior contrast. Hepatic margins appear slightly irregular raising question of cirrhosis Musculoskeletal: No additional osseous findings. Diffuse stranding identified along the RIGHT  pectoralis major and minor muscles which could be related to prior surgery, hemorrhage, or infection. No soft tissue gas. IMPRESSION: BILATERAL pleural effusions and scattered atelectasis greatest in LEFT lower lobe. Pleural calcifications in hemithoraces bilaterally question asbestos exposure. Stranding of the anterior mediastinum likely related to prior cardiac surgery and sternal dehiscence, with herniation of fat through the sternotomy. Diffuse stranding along the RIGHT pectoralis major and minor muscles which could be related to prior surgery, hemorrhage, or infection. 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 Minimally irregular hepatic margins cannot exclude cirrhosis. Aortic Atherosclerosis (ICD10-I70.0). Electronically Signed   By: Lavonia Dana M.D.   On: 09/18/2019 15:27   IR GASTROSTOMY TUBE MOD SED  Result Date: 10/04/2019 CLINICAL DATA:  Dysphagia, needs enteral feeding  support EXAM: PERC PLACEMENT GASTROSTOMY FLUOROSCOPY TIME:  2 minutes 18 seconds; 22 mGy TECHNIQUE: The procedure, risks, benefits, and alternatives were explained to the patient. Questions regarding the procedure were encouraged and answered. The patient understands and consents to the procedure. As antibiotic prophylaxis, cefazolin 2 g was ordered pre-procedure and administered intravenously within one hour of incision. A safe approach avoiding the transverse colon was evident fluoroscopically due to residual contrast in the lumen of the decompressed colon. A 5 French angiographic catheter was placed as orogastric tube. The upper abdomen was prepped with Betadine, draped in usual sterile fashion, and infiltrated locally with 1% lidocaine. Intravenous Fentanyl 66mg and Versed '1mg'$  were administered as conscious sedation during continuous monitoring of the patient's level of consciousness and physiological / cardiorespiratory status by the radiology RN, with a total moderate sedation time of 10 minutes. 0.5 mg glucagon given IV to facilitate gastric distention. Stomach was insufflated using air through the orogastric tube. An 111French sheath needle was advanced percutaneously into the gastric lumen under fluoroscopy. Gas could be aspirated and a small contrast injection confirmed intraluminal spread. The sheath was exchanged over a guidewire for a 9 [place] sheath, through which the snare device was advanced and used to snare a guidewire passed through the orogastric tube. This was withdrawn, and the snare attached to the 20 French pull-through gastrostomy tube, which was advanced antegrade, positioned with the internal bumper securing the anterior gastric wall to the anterior abdominal wall. Small contrast injection confirms appropriate positioning. The external bumper was applied and the catheter was flushed. COMPLICATIONS: COMPLICATIONS none IMPRESSION: 1. Technically successful 20 French pull-through  gastrostomy placement under fluoroscopy. Electronically Signed   By: DLucrezia EuropeM.D.   On: 10/04/2019 17:03   IR Fluoro Guide CV Line Left  Result Date: 09/19/2019 INDICATION: End-stage renal disease. In need of durable intravenous access for continuation dialysis. Note, patient has existing left external jugular vein approach temporary dialysis catheter. Patient currently with open median sternotomy with subcutaneous surgical drains involving the ventral aspect of the chest bilaterally, the right-side of which courses regional to the expected subcutaneous track of a right chest dialysis catheter. Additionally, there is a skin defect/potential developing abscess involving the ventral aspect of the right upper chest, at the location of the standard entrance site of a right internal jugular approach dialysis catheter. As such, we will proceed with image guided placement of a left internal jugular approach dialysis catheter and removal of the temporary left external jugular approach dialysis catheter. EXAM: TUNNELED CENTRAL VENOUS HEMODIALYSIS CATHETER PLACEMENT WITH ULTRASOUND AND FLUOROSCOPIC GUIDANCE MEDICATIONS: Ancef 2 gm IV . The antibiotic was given in an appropriate time interval prior to skin puncture. ANESTHESIA/SEDATION: Moderate (conscious) sedation was employed during this procedure. A total  of Versed 1.5 mg and Fentanyl 75 mcg was administered intravenously. Moderate Sedation Time: 17 minutes. The patient's level of consciousness and vital signs were monitored continuously by radiology nursing throughout the procedure under my direct supervision. FLUOROSCOPY TIME:  1 minute, 6 seconds (11 mGy) COMPLICATIONS: None immediate. PROCEDURE: Informed written consent was obtained from the patient's family after a discussion of the risks, benefits, and alternatives to treatment. Questions regarding the procedure were encouraged and answered. The left neck and chest as well as the external portion of the  existing left external jugular approach dialysis catheter were prepped with chlorhexidine in a sterile fashion, and a sterile drape was applied covering the operative field. Maximum barrier sterile technique with sterile gowns and gloves were used for the procedure. A timeout was performed prior to the initiation of the procedure. After creating a small venotomy incision, a micropuncture kit was utilized to access the internal jugular vein. Real-time ultrasound guidance was utilized for vascular access including the acquisition of a permanent ultrasound image documenting patency of the accessed vessel. The microwire was utilized to measure appropriate catheter length. A stiff Glidewire was advanced to the level of the IVC and the micropuncture sheath was exchanged for a peel-away sheath. A palindrome tunneled hemodialysis catheter measuring 28 cm from tip to cuff was tunneled in a retrograde fashion from the anterior chest wall to the venotomy incision. Next, the external jugular approach temporary dialysis catheter was removed and superficial hemostasis was achieved with manual compression. The new catheter was then placed through the peel-away sheath with tips ultimately positioned within the superior aspect of the right atrium. Final catheter positioning was confirmed and documented with a spot radiographic image. The catheter aspirates and flushes normally. The catheter was flushed with appropriate volume heparin dwells. The catheter exit site was secured with a 0-Prolene retention suture. The venotomy incision was closed with Dermabond and Steri-strips. Dressings were applied. The patient tolerated the procedure well without immediate post procedural complication. IMPRESSION: Successful placement of 28 cm tip to cuff tunneled hemodialysis catheter via the left internal jugular vein with tips terminating within the superior aspect of the right atrium. The catheter is ready for immediate use. Electronically  Signed   By: Sandi Mariscal M.D.   On: 09/19/2019 15:25   IR US Guide Vasc Access Left  Result Date: 09/19/2019 INDICATION: End-stage renal disease. In need of durable intravenous access for continuation dialysis. Note, patient has existing left external jugular vein approach temporary dialysis catheter. Patient currently with open median sternotomy with subcutaneous surgical drains involving the ventral aspect of the chest bilaterally, the right-side of which courses regional to the expected subcutaneous track of a right chest dialysis catheter. Additionally, there is a skin defect/potential developing abscess involving the ventral aspect of the right upper chest, at the location of the standard entrance site of a right internal jugular approach dialysis catheter. As such, we will proceed with image guided placement of a left internal jugular approach dialysis catheter and removal of the temporary left external jugular approach dialysis catheter. EXAM: TUNNELED CENTRAL VENOUS HEMODIALYSIS CATHETER PLACEMENT WITH ULTRASOUND AND FLUOROSCOPIC GUIDANCE MEDICATIONS: Ancef 2 gm IV . The antibiotic was given in an appropriate time interval prior to skin puncture. ANESTHESIA/SEDATION: Moderate (conscious) sedation was employed during this procedure. A total of Versed 1.5 mg and Fentanyl 75 mcg was administered intravenously. Moderate Sedation Time: 17 minutes. The patient's level of consciousness and vital signs were monitored continuously by radiology nursing throughout the procedure under my  direct supervision. FLUOROSCOPY TIME:  1 minute, 6 seconds (11 mGy) COMPLICATIONS: None immediate. PROCEDURE: Informed written consent was obtained from the patient's family after a discussion of the risks, benefits, and alternatives to treatment. Questions regarding the procedure were encouraged and answered. The left neck and chest as well as the external portion of the existing left external jugular approach dialysis catheter  were prepped with chlorhexidine in a sterile fashion, and a sterile drape was applied covering the operative field. Maximum barrier sterile technique with sterile gowns and gloves were used for the procedure. A timeout was performed prior to the initiation of the procedure. After creating a small venotomy incision, a micropuncture kit was utilized to access the internal jugular vein. Real-time ultrasound guidance was utilized for vascular access including the acquisition of a permanent ultrasound image documenting patency of the accessed vessel. The microwire was utilized to measure appropriate catheter length. A stiff Glidewire was advanced to the level of the IVC and the micropuncture sheath was exchanged for a peel-away sheath. A palindrome tunneled hemodialysis catheter measuring 28 cm from tip to cuff was tunneled in a retrograde fashion from the anterior chest wall to the venotomy incision. Next, the external jugular approach temporary dialysis catheter was removed and superficial hemostasis was achieved with manual compression. The new catheter was then placed through the peel-away sheath with tips ultimately positioned within the superior aspect of the right atrium. Final catheter positioning was confirmed and documented with a spot radiographic image. The catheter aspirates and flushes normally. The catheter was flushed with appropriate volume heparin dwells. The catheter exit site was secured with a 0-Prolene retention suture. The venotomy incision was closed with Dermabond and Steri-strips. Dressings were applied. The patient tolerated the procedure well without immediate post procedural complication. IMPRESSION: Successful placement of 28 cm tip to cuff tunneled hemodialysis catheter via the left internal jugular vein with tips terminating within the superior aspect of the right atrium. The catheter is ready for immediate use. Electronically Signed   By: Sandi Mariscal M.D.   On: 09/19/2019 15:25   DG  Chest Port 1 View  Result Date: 09/23/2019 CLINICAL DATA:  History of recent mediastinal expiration EXAM: PORTABLE CHEST 1 VIEW COMPARISON:  09/18/2019 FINDINGS: Postsurgical changes are again seen and stable. New left jugular dialysis catheter is noted with the tip in the right atrium. Tracheostomy tube and feeding catheter are again seen. Slight increased density is noted in the left hemithorax likely related to posterior effusion. No focal infiltrate is noted. No pneumothorax is seen. IMPRESSION: Left-sided pleural effusion. New left jugular dialysis catheter. Remainder of the exam is stable. Electronically Signed   By: Inez Catalina M.D.   On: 09/23/2019 09:22   DG CHEST PORT 1 VIEW  Result Date: 09/18/2019 CLINICAL DATA:  [place] EXAM: PORTABLE CHEST 1 VIEW COMPARISON:  09/15/2019 FINDINGS: Support devices are stable. Prior CABG. Cardiomegaly. Worsening bilateral airspace opacities, most confluent in the left lower lobe. No visible effusions or acute bony abnormality. IMPRESSION: Worsening bilateral airspace disease, favor edema. Dense consolidation in the left lower lobe could reflect atelectasis or pneumonia. Electronically Signed   By: Rolm Baptise M.D.   On: 09/18/2019 07:21   DG Abd Portable 1V  Result Date: 09/30/2019 CLINICAL DATA:  Feeding tube position EXAM: PORTABLE ABDOMEN - 1 VIEW COMPARISON:  08/30/2019 FINDINGS: A feeding tube is in place with tip in the fourth portion of the duodenum. The lower margin of a catheter projects over the right atrium, probably a  dialysis catheter. Lower thoracic spondylosis. The visualized portion of the bowel gas pattern is unremarkable. IMPRESSION: 1. Feeding tube tip: 4th portion of the duodenum. 2. A catheter terminates in the right atrium from above. Electronically Signed   By: Van Clines M.D.   On: 09/30/2019 13:26   DG Swallowing Func-Speech Pathology  Result Date: 10/13/2019 Objective Swallowing Evaluation: Type of Study: MBS-Modified  Barium Swallow Study  Patient Details Name: VED MARTOS MRN: [id number] Date of Birth: Nov 30, 1945 Today's Date: 10/13/2019 Time: SLP Start Time (ACUTE ONLY): 1037 -SLP Stop Time (ACUTE ONLY): 1100 SLP Time Calculation (min) (ACUTE ONLY): 23 min Past Medical History: Past Medical History: Diagnosis Date . BPH (benign prostatic hypertrophy)  . Coronary artery disease  . Diabetes mellitus without complication ([place])  . Dry eyes left . Hiatal hernia  . 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  LIMA TO LAD, RIMA TO PDA, RADIAL ARTERY TO CIRC AND SEQUENTIALLY TO OM1. 08/15/2019 . Hyperlipidemia  . Hypertension  . Incomplete bladder emptying  . Nocturia  . 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 SOB (shortness of breath) on exertion  Past Surgical History: Past Surgical History: Procedure Laterality Date . APPLICATION OF WOUND VAC N/A 05/24/7865  Procedure: APPLICATION OF WOUND VAC;  Surgeon: [name], MD;  Location: [place];  Service: Thoracic;  Laterality: N/A; . APPLICATION OF WOUND VAC  08/29/2019  Procedure: Wound Vac change;  Surgeon: [name], MD;  Location: [place] [facility];  Service: Open Heart Surgery;; . APPLICATION OF WOUND VAC N/A 6/72/0947  Procedure: Application Of Wound Vac;  Surgeon: Grace Jermani, MD;  Location: [place];  Service: Open Heart Surgery;  Laterality: N/A; . APPLICATION OF WOUND VAC N/A 0/96/2836  Procedure: APPLICATION OF ACELL, APPLICATION OF WOUND VAC USING PREVENA INCISIONAL  DRESSING;  Surgeon: Wallace Going, DO;  Location: [place];  Service: Plastics;  Laterality: N/A; . CARDIAC CATHETERIZATION   . CORONARY ARTERY BYPASS GRAFT N/A 08/15/2019  Procedure: CORONARY ARTERY BYPASS GRAFTING (CABG), x 4 using bilateral IMAs and left radial artery .  LIMA TO LAD, RIMA TO PDA, RADIAL ARTERY TO CIRC AND SEQUENTIALLY TO OM1.;  Surgeon: [name],  MD;  Location: [place];  Service: Open Heart Surgery;  Laterality: N/A; . CORONARY STENT PLACEMENT  02/27/2014  distal rt/pd coronary       dr Einar Gip . CYSTO/ BLADDER BIOPSY'S/ CAUTHERIZATION  01-14-2004  DR Gaynelle Arabian . EXPLORATION POST OPERATIVE OPEN HEART N/A 08/16/2019  Procedure: Chest Closure S?P CABG WITH APPLICATION OF PREVENA  INCISIONAL WOUND VAC;  Surgeon: [name], MD;  [facility];  Service: Open Heart Surgery;  Laterality: N/A; . EXPLORATION POST OPERATIVE OPEN HEART N/A 08/21/2019  Procedure: CHEST WASHOUT S/P OPEN CHEST;  Surgeon: [name], MD;  Location: [place];  Service: Open Heart Surgery;  Laterality: N/A;  Open chest with Esmark dressing with Ioban sealant coverage. . EXPLORATION POST OPERATIVE OPEN HEART N/A 08/18/2019  Procedure: EXPLORATION POST OPERATIVE OPEN HEART (performed 04/23 on unit);  Surgeon: [name], MD;  Location: [place];  Service: Open Heart Surgery;  Laterality: N/A; . EXPLORATION POST OPERATIVE OPEN HEART N/A 08/24/2019  Procedure: CHEST WASHOUT POST OPERATIVE OPEN HEART;  Surgeon: [name], MD;  Location: [place];  Service: Open Heart Surgery;  Laterality: N/A; . EXPLORATION POST  OPERATIVE OPEN HEART N/A 08/29/2019  Procedure: CHEST WOUND WASHOUT POST OPERATIVE OPEN HEART;  Surgeon: [name], MD;  Location: [place];  Service: Open Heart Surgery;  Laterality: N/A; . EXPLORATION POST OPERATIVE OPEN HEART N/A 09/04/2019  Procedure: MEDIASTINAL EXPLORATION WITH STERNAL WOUND IRRIGATION;  Surgeon: Grace Arek, MD;  Location: [place];  Service: Open Heart Surgery;  Laterality: N/A; . EXPLORATION POST OPERATIVE OPEN HEART N/A 09/14/2019  Procedure: EVACUATION OF HEMATOMA;  Surgeon: Grace Khali, MD;  Location: [place];  Service: Open Heart Surgery;  Laterality: N/A; . IR FLUORO GUIDE CV LINE LEFT  09/19/2019 . IR GASTROSTOMY TUBE MOD SED  10/04/2019 . IR PATIENT EVAL TECH 0-60 MINS  09/29/2019 . IR US GUIDE VASC ACCESS LEFT  09/19/2019 .  LAPAROSCOPIC LYSIS OF ADHESIONS N/A 09/06/2019  Procedure: LAPAROSCOPIC OMENTAL HARVEST;  Surgeon: Kinsinger, Arta Bruce, MD;  Location: [place];  Service: General;  Laterality: N/A; . LEFT HEART CATH AND CORONARY ANGIOGRAPHY N/A 08/10/2019  Procedure: LEFT HEART CATH AND CORONARY ANGIOGRAPHY;  Surgeon: Nigel Mormon, MD;  Location: [place] [facility];  Service: Cardiovascular;  Laterality: N/A; . LEFT HEART CATHETERIZATION WITH CORONARY ANGIOGRAM N/A 02/27/2014  Procedure: LEFT HEART CATHETERIZATION WITH CORONARY ANGIOGRAM;  Surgeon: Laverda Page, MD;  Location: [facility] CATH LAB;  Service: Cardiovascular;  Laterality: N/A; . MEDIASTINAL EXPLORATION N/A 09/06/2019  Procedure: MEDIASTINAL EXPLORATION;  Surgeon: Grace Gilverto, MD;  Location: [place];  Service: Thoracic;  Laterality: N/A; . PECTORALIS FLAP  09/06/2019  Procedure: Pectoralis ADVANCEMENT Flap;  Surgeon: Wallace Going, DO;  Location: [place];  Service: Plastics;; . PERCUTANEOUS CORONARY STENT INTERVENTION (PCI-S)  02/27/2014  Procedure: PERCUTANEOUS CORONARY STENT INTERVENTION (PCI-S);  Surgeon: Laverda Page, MD;  Location: [facility] CATH LAB;  Service: Cardiovascular;;  rt PDA  3.0/28mm Promus stent . RADIAL ARTERY HARVEST Left 08/15/2019  Procedure: Radial Artery Harvest;  Surgeon: [name], MD;  Location: [place];  Service: Open Heart Surgery;  Laterality: Left; . RIB PLATING N/A 09/06/2019  Procedure: STERNAL PLATING;  Surgeon: Grace Rodolfo, MD;  Location: [place];  Service: Thoracic;  Laterality: N/A; . TEE WITHOUT CARDIOVERSION N/A 08/15/2019  Procedure: TRANSESOPHAGEAL ECHOCARDIOGRAM (TEE);  Surgeon: [name], MD;  Location: [place];  Service: Open Heart Surgery;  Laterality: N/A; . TRACHEOSTOMY TUBE PLACEMENT  08/29/2019  Procedure: Tracheostomy;  Surgeon: [name], MD;  Location: [place] [facility];  Service: Open Heart Surgery;; . TRANSURETHRAL RESECTION OF PROSTATE  04/04/2012  Procedure: TRANSURETHRAL RESECTION OF THE PROSTATE  WITH GYRUS INSTRUMENTS;  Surgeon: Ailene Rud, MD;  Location: [place];  Service: Urology;  Laterality: N/A; . TRANSURETHRAL RESECTION OF PROSTATE N/A 09/27/2014  Procedure: TRANSURETHRAL RESECTION OF THE PROSTATE ;  Surgeon: Carolan Clines, MD;  [facility]: [facility];  Service: Urology;  Laterality: N/A; . UPPER GASTROINTESTINAL ENDOSCOPY   HPI: 74 yo admitted with NSTEMI 4/15, RLL PE, 4/20 CABG x 4 remained open with wound VAC with repeated washouts. 4/23 cardiac arrest, 4/29 initiated CRRT, vent s/p trach. 5/10 additional sternal washout unable to close.  5/12 sternal plating with pectoralis flap and omental harvest to mediastinum off CRRT with iHD started. Chest wound closed. Neurology consulted for left sided weakness- CTH neg 09/08/19. 5/20 pt with emergent hematoma evacuation and transition to trach collar; 5/28 downsized to #4 cuffless; ultimately decannulated. PMHx: BPH, CAD, DM, HLD, HTN Had MBS on 09/30/19, weakness, poor visualization of airway, could not recommend thin liquids. FEES repeated to  determine best diet textures. PEG placed on 10/04/19. Trach decannulated on 10/05/19.  Subjective: Awake, alert Assessment / Plan / Recommendation CHL IP CLINICAL IMPRESSIONS 10/13/2019 Clinical Impression   Pt presents with mild oropharyngeal dysphagia characterized by impaired bolus cohesion with premature spillage to the valleculae and a pharyngeal delay. Pt's swallow was triggered with the head of the bolus at the level of the valleculae with thin liquids via cup and at the pyriform sinuses with thin liquids via straw. He demonstrated penetration (PAS 3) with consecutive swallows of thin liquids via straw secondary to the pharyngeal delay but no aspiration was noted. A regular texture diet with thin liquids is recommended at this time with observance of swallowing precautions. SLP will follow pt briefly to ensure diet tolerance.  SLP Visit Diagnosis Dysphagia, oropharyngeal phase (R13.12)  Attention and concentration deficit following -- Frontal lobe and executive function deficit following -- Impact on safety and function Mild aspiration risk   CHL IP TREATMENT RECOMMENDATION 10/13/2019 Treatment Recommendations Therapy as outlined in treatment plan below   Prognosis 10/13/2019 Prognosis for Safe Diet Advancement Good Barriers to Reach Goals -- Barriers/Prognosis Comment -- CHL IP DIET RECOMMENDATION 10/13/2019 SLP Diet Recommendations Regular solids;Thin liquid Liquid Administration via Cup;Straw Medication Administration Whole meds with liquid Compensations Slow rate;Small sips/bites Postural Changes --   CHL IP OTHER RECOMMENDATIONS 10/13/2019 Recommended Consults -- Oral Care Recommendations Oral care BID Other Recommendations --   CHL IP FOLLOW UP RECOMMENDATIONS 10/13/2019 Follow up Recommendations None   CHL IP FREQUENCY AND DURATION 10/13/2019 Speech Therapy Frequency (ACUTE ONLY) min 2x/week Treatment Duration 1 week      CHL IP ORAL PHASE 10/13/2019 Oral Phase Impaired Oral - Pudding Teaspoon -- Oral - Pudding Cup -- Oral - Honey Teaspoon -- Oral - Honey Cup -- Oral - Nectar Teaspoon NT Oral - Nectar Cup -- Oral - Nectar Straw NT Oral - Thin Teaspoon -- Oral - Thin Cup Premature spillage;Decreased bolus cohesion Oral - Thin Straw Premature spillage;Decreased bolus cohesion Oral - Puree WFL Oral - Mech Soft -- Oral - Regular -- Oral - Multi-Consistency -- Oral - Pill -- Oral Phase - Comment --  CHL IP PHARYNGEAL PHASE 10/13/2019 Pharyngeal Phase Impaired Pharyngeal- Pudding Teaspoon -- Pharyngeal -- Pharyngeal- Pudding Cup -- Pharyngeal -- Pharyngeal- Honey Teaspoon -- Pharyngeal -- Pharyngeal- Honey Cup -- Pharyngeal -- Pharyngeal- Nectar Teaspoon NT Pharyngeal -- Pharyngeal- Nectar Cup -- Pharyngeal -- Pharyngeal- Nectar Straw -- Pharyngeal -- Pharyngeal- Thin Teaspoon NT Pharyngeal -- Pharyngeal- Thin Cup Delayed swallow initiation-vallecula Pharyngeal -- Pharyngeal- Thin Straw Reduced epiglottic  inversion;Penetration/Aspiration during swallow;Delayed swallow initiation-pyriform sinuses Pharyngeal Material enters airway, remains ABOVE vocal cords and not ejected out Pharyngeal- Puree WFL Pharyngeal Material does not enter airway Pharyngeal- Mechanical Soft -- Pharyngeal -- Pharyngeal- Regular WFL Pharyngeal -- Pharyngeal- Multi-consistency -- Pharyngeal -- Pharyngeal- Pill -- Pharyngeal -- Pharyngeal Comment --  CHL IP CERVICAL ESOPHAGEAL PHASE 10/13/2019 Cervical Esophageal Phase WFL Pudding Teaspoon -- Pudding Cup -- Honey Teaspoon -- Honey Cup -- Nectar Teaspoon -- Nectar Cup -- Nectar Straw -- Thin Teaspoon -- Thin Cup -- Thin Straw -- Puree -- Mechanical Soft -- Regular -- Multi-consistency -- Pill -- Cervical Esophageal Comment -- Shanika I. Hardin Negus, [place], [place] Office number [phone] Pager [phone] Horton Marshall 10/13/2019, 11:42 AM              DG Swallowing Func-Speech Pathology  Result Date: 09/30/2019 Objective Swallowing Evaluation: Type of Study: MBS-Modified Barium Swallow Study  Patient Details  Name: MUNIR VICTORIAN MRN: [id number] Date of Birth: 1945/12/28 Today's Date: 09/30/2019 Time: SLP Start Time (ACUTE ONLY): 1400 -SLP Stop Time (ACUTE ONLY): 1416 SLP Time Calculation (min) (ACUTE ONLY): 16 min Past Medical History: Past Medical History: Diagnosis Date . BPH (benign prostatic hypertrophy)  . Coronary artery disease  . Diabetes mellitus without complication ([place])  . Dry eyes left . Hiatal hernia  . 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  LIMA TO LAD, RIMA TO PDA, RADIAL ARTERY TO CIRC AND SEQUENTIALLY TO OM1. 08/15/2019 . Hyperlipidemia  . Hypertension  . Incomplete bladder emptying  . Nocturia  . 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 SOB (shortness of breath) on exertion  Past Surgical History: Past Surgical  History: Procedure Laterality Date . APPLICATION OF WOUND VAC N/A 8/88/9169  Procedure: APPLICATION OF WOUND VAC;  Surgeon: [name], MD;  Location: [place];  Service: Thoracic;  Laterality: N/A; . APPLICATION OF WOUND VAC  08/29/2019  Procedure: Wound Vac change;  Surgeon: [name], MD;  Location: [place] [facility];  Service: Open Heart Surgery;; . APPLICATION OF WOUND VAC N/A 4/50/3888  Procedure: Application Of Wound Vac;  Surgeon: Grace Mansour, MD;  Location: [place];  Service: Open Heart Surgery;  Laterality: N/A; . APPLICATION OF WOUND VAC N/A 2/80/0349  Procedure: APPLICATION OF ACELL, APPLICATION OF WOUND VAC USING PREVENA INCISIONAL  DRESSING;  Surgeon: Wallace Going, DO;  Location: [place];  Service: Plastics;  Laterality: N/A; . CARDIAC CATHETERIZATION   . CORONARY ARTERY BYPASS GRAFT N/A 08/15/2019  Procedure: CORONARY ARTERY BYPASS GRAFTING (CABG), x 4 using bilateral IMAs and left radial artery .  LIMA TO LAD, RIMA TO PDA, RADIAL ARTERY TO CIRC AND SEQUENTIALLY TO OM1.;  Surgeon: [name], MD;  Location: [place];  Service: Open Heart Surgery;  Laterality: N/A; . CORONARY STENT PLACEMENT  02/27/2014  distal rt/pd coronary       dr Einar Gip . CYSTO/ BLADDER BIOPSY'S/ CAUTHERIZATION  01-14-2004  DR Gaynelle Arabian . EXPLORATION POST OPERATIVE OPEN HEART N/A 08/16/2019  Procedure: Chest Closure S?P CABG WITH APPLICATION OF PREVENA  INCISIONAL WOUND VAC;  Surgeon: [name], MD;  [facility];  Service: Open Heart Surgery;  Laterality: N/A; . EXPLORATION POST OPERATIVE OPEN HEART N/A 08/21/2019  Procedure: CHEST WASHOUT S/P OPEN CHEST;  Surgeon: [name], MD;  Location: [place];  Service: Open Heart Surgery;  Laterality: N/A;  Open chest with Esmark dressing with Ioban sealant coverage. . EXPLORATION POST OPERATIVE OPEN HEART N/A 08/18/2019  Procedure: EXPLORATION POST OPERATIVE OPEN HEART (performed 04/23 on unit);  Surgeon: [name], MD;  Location: [place];  Service: Open  Heart Surgery;  Laterality: N/A; . EXPLORATION POST OPERATIVE OPEN HEART N/A 08/24/2019  Procedure: CHEST WASHOUT POST OPERATIVE OPEN HEART;  Surgeon: [name], MD;  Location: [place];  Service: Open Heart Surgery;  Laterality: N/A; . EXPLORATION POST OPERATIVE OPEN HEART N/A 08/29/2019  Procedure: CHEST WOUND WASHOUT POST OPERATIVE OPEN HEART;  Surgeon: [name], MD;  Location: [place];  Service: Open Heart Surgery;  Laterality: N/A; . EXPLORATION POST OPERATIVE OPEN HEART N/A 09/04/2019  Procedure: MEDIASTINAL EXPLORATION WITH STERNAL WOUND IRRIGATION;  Surgeon: Grace Carron, MD;  Location: [place];  Service: Open Heart Surgery;  Laterality: N/A; . EXPLORATION POST OPERATIVE OPEN HEART  N/A 09/14/2019  Procedure: EVACUATION OF HEMATOMA;  Surgeon: Grace Braddock, MD;  Location: [place];  Service: Open Heart Surgery;  Laterality: N/A; . IR FLUORO GUIDE CV LINE LEFT  09/19/2019 . IR PATIENT EVAL TECH 0-60 MINS  09/29/2019 . IR US GUIDE VASC ACCESS LEFT  09/19/2019 . LAPAROSCOPIC LYSIS OF ADHESIONS N/A 09/06/2019  Procedure: LAPAROSCOPIC OMENTAL HARVEST;  Surgeon: Kinsinger, Arta Bruce, MD;  Location: [place];  Service: General;  Laterality: N/A; . LEFT HEART CATH AND CORONARY ANGIOGRAPHY N/A 08/10/2019  Procedure: LEFT HEART CATH AND CORONARY ANGIOGRAPHY;  Surgeon: Nigel Mormon, MD;  Location: [place] [facility];  Service: Cardiovascular;  Laterality: N/A; . LEFT HEART CATHETERIZATION WITH CORONARY ANGIOGRAM N/A 02/27/2014  Procedure: LEFT HEART CATHETERIZATION WITH CORONARY ANGIOGRAM;  Surgeon: Laverda Page, MD;  Location: [facility] CATH LAB;  Service: Cardiovascular;  Laterality: N/A; . MEDIASTINAL EXPLORATION N/A 09/06/2019  Procedure: MEDIASTINAL EXPLORATION;  Surgeon: Grace Jovin, MD;  Location: [place];  Service: Thoracic;  Laterality: N/A; . PECTORALIS FLAP  09/06/2019  Procedure: Pectoralis ADVANCEMENT Flap;  Surgeon: Wallace Going, DO;  Location: [place];  Service: Plastics;; .  PERCUTANEOUS CORONARY STENT INTERVENTION (PCI-S)  02/27/2014  Procedure: PERCUTANEOUS CORONARY STENT INTERVENTION (PCI-S);  Surgeon: Laverda Page, MD;  Location: [facility] CATH LAB;  Service: Cardiovascular;;  rt PDA  3.0/28mm Promus stent . RADIAL ARTERY HARVEST Left 08/15/2019  Procedure: Radial Artery Harvest;  Surgeon: [name], MD;  Location: [place];  Service: Open Heart Surgery;  Laterality: Left; . RIB PLATING N/A 09/06/2019  Procedure: STERNAL PLATING;  Surgeon: Grace Hadley, MD;  Location: [place];  Service: Thoracic;  Laterality: N/A; . TEE WITHOUT CARDIOVERSION N/A 08/15/2019  Procedure: TRANSESOPHAGEAL ECHOCARDIOGRAM (TEE);  Surgeon: [name], MD;  Location: [place];  Service: Open Heart Surgery;  Laterality: N/A; . TRACHEOSTOMY TUBE PLACEMENT  08/29/2019  Procedure: Tracheostomy;  Surgeon: [name], MD;  Location: [place] [facility];  Service: Open Heart Surgery;; . TRANSURETHRAL RESECTION OF PROSTATE  04/04/2012  Procedure: TRANSURETHRAL RESECTION OF THE PROSTATE WITH GYRUS INSTRUMENTS;  Surgeon: Ailene Rud, MD;  Location: [place];  Service: Urology;  Laterality: N/A; . TRANSURETHRAL RESECTION OF PROSTATE N/A 09/27/2014  Procedure: TRANSURETHRAL RESECTION OF THE PROSTATE ;  Surgeon: Carolan Clines, MD;  [facility];  Service: Urology;  Laterality: N/A; . UPPER GASTROINTESTINAL ENDOSCOPY   HPI: 74 yo admitted with NSTEMI 4/15, RLL PE, 4/20 CABG x 4 remained open with wound VAC with repeated washouts. 4/23 cardiac arrest, 4/29 initiated CRRT, vent s/p trach. 5/10 additional sternal washout unable to close.  5/12 sternal plating with pectoralis flap and omental harvest to mediastinum off CRRT with iHD started. Chest wound closed. Neurology consulted for left sided weakness- CTH neg 09/08/19. 5/20 pt with emergent hematoma evacuation and transition to trach collar; 5/28 downsized to #4 cuffless. PMHx: BPH, CAD, DM, HLD, HTN  Subjective: Awake, alert Assessment /  Plan / Recommendation CHL IP CLINICAL IMPRESSIONS 09/30/2019 Clinical Impression Pt presents with mild oropharyngeal dysphagia which resulted in decreased lingual coordination, premature spillage, and penetration of thin liquid.  Although intial trials appeared very good, there was penetration with later trials, which may have been due in part to fatigue.  Pt's positioning was very poor preventing visualization of laryngeal vestibule and vocal folds.  It was unclear if penetration proceeded to aspiration or if it was cleared during the swallow.  Pt was repositioned during the study to attempt to improve  visibility without success. An oral diet cannot be recommended based on this study; however, it is not clear that PO intake is unsafe either.  Recommend further evaluation by FEES to improve visualization of laryngeal vestibule prior to proceeding wiht PEG placement. SLP Visit Diagnosis Dysphagia, oropharyngeal phase (R13.12) Attention and concentration deficit following -- Frontal lobe and executive function deficit following -- Impact on safety and function Mild aspiration risk   CHL IP TREATMENT RECOMMENDATION 09/30/2019 Treatment Recommendations F/U MBS in --- days (Comment)   Prognosis 09/26/2019 Prognosis for Safe Diet Advancement Good Barriers to Reach Goals -- Barriers/Prognosis Comment -- CHL IP DIET RECOMMENDATION 09/30/2019 SLP Diet Recommendations NPO Liquid Administration via -- Medication Administration -- Compensations -- Postural Changes --   No flowsheet data found.  CHL IP FOLLOW UP RECOMMENDATIONS 09/26/2019 Follow up Recommendations 24 hour supervision/assistance   CHL IP FREQUENCY AND DURATION 09/26/2019 Speech Therapy Frequency (ACUTE ONLY) min 2x/week Treatment Duration 2 weeks      CHL IP ORAL PHASE 09/30/2019 Oral Phase Impaired Oral - Pudding Teaspoon -- Oral - Pudding Cup -- Oral - Honey Teaspoon -- Oral - Honey Cup -- Oral - Nectar Teaspoon -- Oral - Nectar Cup -- Oral - Nectar Straw -- Oral - Thin  Teaspoon -- Oral - Thin Cup Premature spillage;Lingual/palatal residue Oral - Thin Straw Premature spillage;Lingual/palatal residue Oral - Puree Lingual/palatal residue;Premature spillage;Lingual pumping;Delayed oral transit Oral - Mech Soft -- Oral - Regular Lingual pumping;Lingual/palatal residue;Delayed oral transit Oral - Multi-Consistency -- Oral - Pill -- Oral Phase - Comment --  CHL IP PHARYNGEAL PHASE 09/30/2019 Pharyngeal Phase Impaired Pharyngeal- Pudding Teaspoon -- Pharyngeal -- Pharyngeal- Pudding Cup -- Pharyngeal -- Pharyngeal- Honey Teaspoon -- Pharyngeal -- Pharyngeal- Honey Cup -- Pharyngeal -- Pharyngeal- Nectar Teaspoon -- Pharyngeal -- Pharyngeal- Nectar Cup -- Pharyngeal -- Pharyngeal- Nectar Straw -- Pharyngeal -- Pharyngeal- Thin Teaspoon -- Pharyngeal -- Pharyngeal- Thin Cup Delayed swallow initiation-pyriform sinuses;Reduced epiglottic inversion;Reduced tongue base retraction Pharyngeal Material does not enter airway Pharyngeal- Thin Straw Delayed swallow initiation-pyriform sinuses;Reduced epiglottic inversion;Reduced airway/laryngeal closure;Reduced laryngeal elevation;Reduced anterior laryngeal mobility;Penetration/Aspiration before swallow;Penetration/Aspiration during swallow;Pharyngeal residue - valleculae Pharyngeal Material enters airway, remains ABOVE vocal cords and not ejected out;Material enters airway, remains ABOVE vocal cords then ejected out Pharyngeal- Puree Delayed swallow initiation-vallecula;Reduced tongue base retraction;Pharyngeal residue - valleculae Pharyngeal Material does not enter airway Pharyngeal- Mechanical Soft -- Pharyngeal -- Pharyngeal- Regular Delayed swallow initiation-vallecula;Reduced tongue base retraction;Pharyngeal residue - valleculae Pharyngeal Material does not enter airway Pharyngeal- Multi-consistency -- Pharyngeal -- Pharyngeal- Pill -- Pharyngeal -- Pharyngeal Comment --  No flowsheet data found. Celedonio Savage, MA, CCC-SLP Acute Rehabilitation  Services Office: [phone] 09/30/2019, 5:14 PM              IR PATIENT EVAL TECH 0-60 MINS  Result Date: 09/29/2019 Karenann Cai     09/29/2019 11:15 AM IR notified of patient with ongoing bleeding from his catheter site.  Patient brought to IR. Held pressure for 10 minutes with no oozing noted.  Injected trach with gel foam and held pressure for an additional 10 minutes.  No oozing noted after observation for 5 minutes.  Dressed catheter.  G-tube consult to follow.  Brynda Greathouse, MS RD PA-C 10:50 AM     Discharge Medications: Allergies as of 10/16/2019   No Known Allergies     Medication List    STOP taking these medications   cloNIDine 0.2 MG tablet Commonly known as: CATAPRES   furosemide 20 MG tablet Commonly known  as: LASIX   isosorbide mononitrate 60 MG 24 hr tablet Commonly known as: IMDUR   metoprolol succinate 100 MG 24 hr tablet Commonly known as: TOPROL-XL   nitroGLYCERIN 0.4 MG SL tablet Commonly known as: NITROSTAT   olmesartan-hydrochlorothiazide 40-25 MG tablet Commonly known as: BENICAR HCT   spironolactone 25 MG tablet Commonly known as: ALDACTONE   sucralfate 1 GM/10ML suspension Commonly known as: Carafate   verapamil 240 MG 24 hr capsule Commonly known as: VERELAN PM     TAKE these medications   acetaminophen 325 MG tablet Commonly known as: TYLENOL Take 2 tablets (650 mg total) by mouth every 6 (six) hours as needed for mild pain or fever.   amiodarone 200 MG tablet Commonly known as: PACERONE Take 1 tablet (200 mg total) by mouth daily.   aspirin EC 81 MG tablet Take 81 mg by mouth daily.   atorvastatin 20 MG tablet Commonly known as: LIPITOR TAKE 1 TABLET BY MOUTH EVERY DAY IN THE EVENING What changed:   how much to take  how to take this  when to take this  additional instructions   clonazePAM 0.5 MG disintegrating tablet Commonly known as: KLONOPIN Take 1 tablet (0.5 mg total) by mouth 2 (two) times daily.     cromolyn 4 % ophthalmic solution Commonly known as: OPTICROM Place 1 drop into both eyes 4 (four) times daily as needed (dry eyes).   Darbepoetin Alfa 100 MCG/0.5ML Sosy injection Commonly known as: ARANESP Inject 0.5 mLs (100 mcg total) into the vein every Tuesday with hemodialysis. Start taking on: October 17, 2019   feeding supplement (NEPRO CARB STEADY) Liqd Place 720 mLs into feeding tube daily.   feeding supplement (PRO-STAT SUGAR FREE 64) Liqd Place 60 mLs into feeding tube 2 (two) times daily.   Gerhardt's butt cream Crea Apply 1 application topically 4 (four) times daily as needed for irritation.   Jardiance 10 MG Tabs tablet Generic drug: empagliflozin Take 10 mg by mouth daily before breakfast.   metFORMIN 1000 MG tablet Commonly known as: GLUCOPHAGE TAKE 1 TABLET TWICE A DAY WITH A MEAL What changed: See the new instructions.   multivitamin Tabs tablet Take 1 tablet by mouth at bedtime.   oxyCODONE 5 MG immediate release tablet Commonly known as: Oxy IR/ROXICODONE Take 1 tablet (5 mg total) by mouth every 6 (six) hours as needed for severe pain.   PreviDent 5000 Booster Plus 1.1 % Pste Generic drug: Sodium Fluoride Take 1 application by mouth daily.   sertraline 25 MG tablet Commonly known as: ZOLOFT Take 1 tablet (25 mg total) by mouth daily.   sevelamer carbonate 0.8 g Pack packet Commonly known as: RENVELA Take 1.6 g by mouth 3 (three) times daily with meals.   silodosin 8 MG Caps capsule Commonly known as: RAPAFLO Take 8 mg by mouth at bedtime.   tamsulosin 0.4 MG Caps capsule Commonly known as: FLOMAX Take 0.4 mg by mouth daily after breakfast.   Trospium Chloride 60 MG Cp24 Take 1 capsule by mouth daily.      The patient has been discharged on:   1.Beta Blocker:  Yes [   ]                              No   [ X  ]  If No, reason: labile BP  2.Ace Inhibitor/ARB: Yes [   ]                                      No  [  X  ]                                     If No, reason: ESRD 3.Statin:   Yes [ X  ]                  No  [   ]                  If No, reason:  4.Ecasa:  Yes  Valu.Nieves   ]                  No   [   ]                  If No, reason:   Signed: Mailynn Everly BarrettPA-C 10/16/2019, 10:01 AM

## 2019-08-15 NOTE — Anesthesia Procedure Notes (Signed)
Central Venous Catheter Insertion Performed by: Roderic Palau, MD, anesthesiologist Start/End4/20/2021 6:55 AM, 08/15/2019 7:10 AM Patient location: Pre-op. Preanesthetic checklist: patient identified, IV checked, site marked, risks and benefits discussed, surgical consent, monitors and equipment checked, pre-op evaluation, timeout performed and anesthesia consent Position: Trendelenburg Lidocaine 1% used for infiltration and patient sedated Hand hygiene performed , maximum sterile barriers used  and Seldinger technique used Catheter size: 9 Fr Total catheter length 10. Central line was placed.MAC introducer Procedure performed using ultrasound guided technique. Ultrasound Notes:anatomy identified, needle tip was noted to be adjacent to the nerve/plexus identified, no ultrasound evidence of intravascular and/or intraneural injection and image(s) printed for medical record Attempts: 1 Following insertion, line sutured, dressing applied and Biopatch. Post procedure assessment: blood return through all ports, free fluid flow and no air  Patient tolerated the procedure well with no immediate complications.

## 2019-08-16 ENCOUNTER — Inpatient Hospital Stay (HOSPITAL_COMMUNITY): Payer: Medicare Other

## 2019-08-16 ENCOUNTER — Inpatient Hospital Stay (HOSPITAL_COMMUNITY): Payer: Medicare Other | Admitting: Anesthesiology

## 2019-08-16 ENCOUNTER — Encounter (HOSPITAL_COMMUNITY)
Admission: EM | Disposition: A | Discharge status: Skilled Nursing Facility | Payer: Self-pay | Source: Home / Self Care | Attending: Cardiothoracic Surgery

## 2019-08-16 DIAGNOSIS — I25110 Atherosclerotic heart disease of native coronary artery with unstable angina pectoris: Secondary | ICD-10-CM | POA: Diagnosis not present

## 2019-08-16 HISTORY — PX: EXPLORATION POST OPERATIVE OPEN HEART: SHX5061

## 2019-08-16 LAB — BASIC METABOLIC PANEL
Anion gap: 5 (ref 5–15)
BUN: 7 mg/dL — ABNORMAL LOW (ref 8–23)
CO2: 24 mmol/L (ref 22–32)
Calcium: 7.5 mg/dL — ABNORMAL LOW (ref 8.9–10.3)
Chloride: 112 mmol/L — ABNORMAL HIGH (ref 98–111)
Creatinine, Ser: 1.3 mg/dL — ABNORMAL HIGH (ref 0.61–1.24)
GFR calc Af Amer: >60 mL/min (ref >60)
GFR calc non Af Amer: 55 mL/min — ABNORMAL LOW (ref >60)
Glucose, Bld: 118 mg/dL — ABNORMAL HIGH (ref 70–99)
Potassium: 4 mmol/L (ref 3.5–5.1)
Sodium: 141 mmol/L (ref 135–145)

## 2019-08-16 LAB — PREPARE RBC (CROSSMATCH)

## 2019-08-16 LAB — CBC
HCT: 25.4 % — ABNORMAL LOW (ref 39.0–52.0)
HCT: 35.3 % — ABNORMAL LOW (ref 39.0–52.0)
Hemoglobin: 8.3 g/dL — ABNORMAL LOW (ref 13.0–17.0)
Hemoglobin: 11.7 g/dL — ABNORMAL LOW (ref 13.0–17.0)
MCH: 32.4 pg (ref 26.0–34.0)
MCH: 30.7 pg (ref 26.0–34.0)
MCHC: 32.7 g/dL (ref 30.0–36.0)
MCHC: 33.1 g/dL (ref 30.0–36.0)
MCV: 99.2 fL (ref 80.0–100.0)
MCV: 92.7 fL (ref 80.0–100.0)
Platelets: 108 10*3/uL — ABNORMAL LOW (ref 150–400)
Platelets: 113 10*3/uL — ABNORMAL LOW (ref 150–400)
RBC: 2.56 MIL/uL — ABNORMAL LOW (ref 4.22–5.81)
RBC: 3.81 MIL/uL — ABNORMAL LOW (ref 4.22–5.81)
RDW: 16.7 % — ABNORMAL HIGH (ref 11.5–15.5)
RDW: 19 % — ABNORMAL HIGH (ref 11.5–15.5)
WBC: 8.7 10*3/uL (ref 4.0–10.5)
WBC: 11.2 10*3/uL — ABNORMAL HIGH (ref 4.0–10.5)
nRBC: 0 % (ref 0.0–0.2)
nRBC: 0 % (ref 0.0–0.2)

## 2019-08-16 LAB — BPAM FFP
Blood Product Expiration Date: 202104252359
Blood Product Expiration Date: 202104252359
ISSUE DATE / TIME: 202104201458
ISSUE DATE / TIME: 202104201643
Unit Type and Rh: 5100
Unit Type and Rh: 5100

## 2019-08-16 LAB — COMPREHENSIVE METABOLIC PANEL
ALT: 26 U/L (ref 0–44)
AST: 46 U/L — ABNORMAL HIGH (ref 15–41)
Albumin: 2.9 g/dL — ABNORMAL LOW (ref 3.5–5.0)
Alkaline Phosphatase: 47 U/L (ref 38–126)
Anion gap: 8 (ref 5–15)
BUN: 7 mg/dL — ABNORMAL LOW (ref 8–23)
CO2: 23 mmol/L (ref 22–32)
Calcium: 7.6 mg/dL — ABNORMAL LOW (ref 8.9–10.3)
Chloride: 108 mmol/L (ref 98–111)
Creatinine, Ser: 1.31 mg/dL — ABNORMAL HIGH (ref 0.61–1.24)
GFR calc Af Amer: >60 mL/min (ref >60)
GFR calc non Af Amer: 54 mL/min — ABNORMAL LOW (ref >60)
Glucose, Bld: 144 mg/dL — ABNORMAL HIGH (ref 70–99)
Potassium: 4 mmol/L (ref 3.5–5.1)
Sodium: 139 mmol/L (ref 135–145)
Total Bilirubin: 0.6 mg/dL (ref 0.3–1.2)
Total Protein: 5.3 g/dL — ABNORMAL LOW (ref 6.5–8.1)

## 2019-08-16 LAB — POCT I-STAT 7, (LYTES, BLD GAS, ICA,H+H)
Acid-Base Excess: 2 mmol/L (ref 0.0–2.0)
Acid-base deficit: 3 mmol/L — ABNORMAL HIGH (ref 0.0–2.0)
Bicarbonate: 27 mmol/L (ref 20.0–28.0)
Bicarbonate: 21.4 mmol/L (ref 20.0–28.0)
Calcium, Ion: 1.16 mmol/L (ref 1.15–1.40)
Calcium, Ion: 1.08 mmol/L — ABNORMAL LOW (ref 1.15–1.40)
HCT: 22 % — ABNORMAL LOW (ref 39.0–52.0)
HCT: 31 % — ABNORMAL LOW (ref 39.0–52.0)
Hemoglobin: 7.5 g/dL — ABNORMAL LOW (ref 13.0–17.0)
Hemoglobin: 10.5 g/dL — ABNORMAL LOW (ref 13.0–17.0)
O2 Saturation: 100 %
O2 Saturation: 99 %
Patient temperature: 37.5
Patient temperature: 37.4
Potassium: 3.9 mmol/L (ref 3.5–5.1)
Potassium: 3.3 mmol/L — ABNORMAL LOW (ref 3.5–5.1)
Sodium: 142 mmol/L (ref 135–145)
Sodium: 141 mmol/L (ref 135–145)
TCO2: 28 mmol/L (ref 22–32)
TCO2: 22 mmol/L (ref 22–32)
pCO2 arterial: 43 mmHg (ref 32.0–48.0)
pCO2 arterial: 37.5 mmHg (ref 32.0–48.0)
pH, Arterial: 7.408 (ref 7.350–7.450)
pH, Arterial: 7.366 (ref 7.350–7.450)
pO2, Arterial: 207 mmHg — ABNORMAL HIGH (ref 83.0–108.0)
pO2, Arterial: 145 mmHg — ABNORMAL HIGH (ref 83.0–108.0)

## 2019-08-16 LAB — COOXEMETRY PANEL
Carboxyhemoglobin: 0.8 % (ref 0.5–1.5)
Methemoglobin: 1 % (ref 0.0–1.5)
O2 Saturation: 76 %
Total hemoglobin: 7.6 g/dL — ABNORMAL LOW (ref 12.0–16.0)

## 2019-08-16 LAB — PREPARE FRESH FROZEN PLASMA

## 2019-08-16 LAB — PROTIME-INR
INR: 1.2 (ref 0.8–1.2)
Prothrombin Time: 14.9 seconds (ref 11.4–15.2)

## 2019-08-16 LAB — GLUCOSE, CAPILLARY
Glucose-Capillary: 109 mg/dL — ABNORMAL HIGH (ref 70–99)
Glucose-Capillary: 118 mg/dL — ABNORMAL HIGH (ref 70–99)
Glucose-Capillary: 122 mg/dL — ABNORMAL HIGH (ref 70–99)
Glucose-Capillary: 105 mg/dL — ABNORMAL HIGH (ref 70–99)
Glucose-Capillary: 116 mg/dL — ABNORMAL HIGH (ref 70–99)
Glucose-Capillary: 95 mg/dL (ref 70–99)
Glucose-Capillary: 102 mg/dL — ABNORMAL HIGH (ref 70–99)
Glucose-Capillary: 103 mg/dL — ABNORMAL HIGH (ref 70–99)
Glucose-Capillary: 124 mg/dL — ABNORMAL HIGH (ref 70–99)
Glucose-Capillary: 111 mg/dL — ABNORMAL HIGH (ref 70–99)
Glucose-Capillary: 130 mg/dL — ABNORMAL HIGH (ref 70–99)
Glucose-Capillary: 131 mg/dL — ABNORMAL HIGH (ref 70–99)
Glucose-Capillary: 141 mg/dL — ABNORMAL HIGH (ref 70–99)
Glucose-Capillary: 132 mg/dL — ABNORMAL HIGH (ref 70–99)
Glucose-Capillary: 152 mg/dL — ABNORMAL HIGH (ref 70–99)
Glucose-Capillary: 148 mg/dL — ABNORMAL HIGH (ref 70–99)
Glucose-Capillary: 149 mg/dL — ABNORMAL HIGH (ref 70–99)

## 2019-08-16 LAB — MAGNESIUM
Magnesium: 2.5 mg/dL — ABNORMAL HIGH (ref 1.7–2.4)
Magnesium: 2.4 mg/dL (ref 1.7–2.4)

## 2019-08-16 SURGERY — EXPLORATION POST OPERATIVE OPEN HEART
Anesthesia: General | Site: Chest

## 2019-08-16 MED ORDER — MIDAZOLAM HCL 2 MG/2ML IJ SOLN
INTRAMUSCULAR | Status: AC
  Filled 2019-08-16: qty 2

## 2019-08-16 MED ORDER — POTASSIUM CHLORIDE 10 MEQ/50ML IV SOLN
10.0000 meq | INTRAVENOUS | Status: AC
  Administered 2019-08-16 (×3): 10 meq via INTRAVENOUS

## 2019-08-16 MED ORDER — FENTANYL CITRATE (PF) 250 MCG/5ML IJ SOLN
INTRAMUSCULAR | Status: DC | PRN
  Administered 2019-08-16: 50 ug via INTRAVENOUS
  Administered 2019-08-16: 150 ug via INTRAVENOUS
  Administered 2019-08-16: 100 ug via INTRAVENOUS
  Administered 2019-08-16 (×2): 50 ug via INTRAVENOUS
  Administered 2019-08-16: 100 ug via INTRAVENOUS

## 2019-08-16 MED ORDER — FUROSEMIDE 10 MG/ML IJ SOLN
40.0000 mg | Freq: Once | INTRAMUSCULAR | Status: AC
  Administered 2019-08-16: 40 mg via INTRAVENOUS
  Filled 2019-08-16: qty 4

## 2019-08-16 MED ORDER — FENTANYL CITRATE (PF) 250 MCG/5ML IJ SOLN
INTRAMUSCULAR | Status: AC
  Filled 2019-08-16: qty 5

## 2019-08-16 MED ORDER — 0.9 % SODIUM CHLORIDE (POUR BTL) OPTIME
TOPICAL | Status: DC | PRN
  Administered 2019-08-16: 3000 mL

## 2019-08-16 MED ORDER — NOREPINEPHRINE 4 MG/250ML-% IV SOLN
INTRAVENOUS | Status: DC | PRN
  Administered 2019-08-16: 2 ug/min via INTRAVENOUS

## 2019-08-16 MED ORDER — PROPOFOL 500 MG/50ML IV EMUL
INTRAVENOUS | Status: DC | PRN
  Administered 2019-08-16: 50 ug/kg/min via INTRAVENOUS

## 2019-08-16 MED ORDER — ASPIRIN 300 MG RE SUPP
300.0000 mg | Freq: Every day | RECTAL | Status: DC
  Administered 2019-08-16 – 2019-08-18 (×2): 300 mg via RECTAL
  Filled 2019-08-16: qty 1

## 2019-08-16 MED ORDER — SODIUM CHLORIDE 0.9% IV SOLUTION: Freq: Once | INTRAVENOUS | Status: AC

## 2019-08-16 MED ORDER — PROPOFOL 10 MG/ML IV BOLUS
INTRAVENOUS | Status: AC
  Filled 2019-08-16: qty 40

## 2019-08-16 MED ORDER — VANCOMYCIN HCL 1000 MG IV SOLR
INTRAVENOUS | Status: AC
  Filled 2019-08-16: qty 3000

## 2019-08-16 MED ORDER — FUROSEMIDE 10 MG/ML IJ SOLN
10.0000 mg/h | INTRAVENOUS | Status: DC
  Administered 2019-08-16 – 2019-08-17 (×2): 8 mg/h via INTRAVENOUS
  Filled 2019-08-16: qty 21
  Filled 2019-08-16: qty 25

## 2019-08-16 MED ORDER — MIDAZOLAM HCL 5 MG/5ML IJ SOLN
INTRAMUSCULAR | Status: DC | PRN
  Administered 2019-08-16 (×2): 1 mg via INTRAVENOUS

## 2019-08-16 MED ORDER — ROCURONIUM BROMIDE 100 MG/10ML IV SOLN
INTRAVENOUS | Status: DC | PRN
  Administered 2019-08-16: 50 mg via INTRAVENOUS

## 2019-08-16 MED ORDER — VANCOMYCIN HCL 1000 MG IV SOLR
INTRAVENOUS | Status: DC | PRN
  Administered 2019-08-16: 3 g via TOPICAL

## 2019-08-16 MED ORDER — SODIUM CHLORIDE 0.9 % IV SOLN: INTRAVENOUS | Status: DC | PRN

## 2019-08-16 MED FILL — Potassium Chloride Inj 2 mEq/ML: INTRAVENOUS | Qty: 40 | Status: AC

## 2019-08-16 MED FILL — Heparin Sodium (Porcine) Inj 1000 Unit/ML: INTRAMUSCULAR | Qty: 30 | Status: AC

## 2019-08-16 MED FILL — Magnesium Sulfate Inj 50%: INTRAMUSCULAR | Qty: 10 | Status: AC

## 2019-08-16 SURGICAL SUPPLY — 70 items
BAG DECANTER FOR FLEXI CONT (MISCELLANEOUS) IMPLANT
BENZOIN TINCTURE PRP APPL 2/3 (GAUZE/BANDAGES/DRESSINGS) ×4 IMPLANT
BNDG ELASTIC 4X5.8 VLCR STR LF (GAUZE/BANDAGES/DRESSINGS) IMPLANT
BNDG ELASTIC 6X5.8 VLCR STR LF (GAUZE/BANDAGES/DRESSINGS) IMPLANT
CANISTER SUCT 3000ML PPV (MISCELLANEOUS) ×2 IMPLANT
CANISTER WOUND CARE 500ML ATS (WOUND CARE) ×2 IMPLANT
CATH CPB KIT HENDRICKSON (MISCELLANEOUS) IMPLANT
CATH ROBINSON RED A/P 18FR (CATHETERS) IMPLANT
CLIP VESOCCLUDE MED 6/CT (CLIP) ×2 IMPLANT
CLIP VESOCCLUDE SM WIDE 6/CT (CLIP) ×2 IMPLANT
CONN ST 1/4X3/8  BEN (MISCELLANEOUS) ×2
CONN ST 1/4X3/8 BEN (MISCELLANEOUS) ×1 IMPLANT
DERMABOND ADVANCED (GAUZE/BANDAGES/DRESSINGS)
DERMABOND ADVANCED .7 DNX12 (GAUZE/BANDAGES/DRESSINGS) IMPLANT
DRAIN CHANNEL 28F RND 3/8 FF (WOUND CARE) ×2 IMPLANT
DRAPE CARDIOVASCULAR INCISE (DRAPES) ×2
DRAPE SLUSH/WARMER DISC (DRAPES) ×2 IMPLANT
DRAPE SRG 135X102X78XABS (DRAPES) ×1 IMPLANT
DRSG AQUACEL AG ADV 3.5X14 (GAUZE/BANDAGES/DRESSINGS) ×2 IMPLANT
ELECT CAUTERY BLADE 6.4 (BLADE) ×2 IMPLANT
ELECT REM PT RETURN 9FT ADLT (ELECTROSURGICAL) ×4
ELECTRODE REM PT RTRN 9FT ADLT (ELECTROSURGICAL) ×2 IMPLANT
FELT TEFLON 1X6 (MISCELLANEOUS) ×2 IMPLANT
GAUZE SPONGE 4X4 12PLY STRL (GAUZE/BANDAGES/DRESSINGS) ×2 IMPLANT
GLOVE BIOGEL PI IND STRL 6 (GLOVE) ×1 IMPLANT
GLOVE BIOGEL PI IND STRL 6.5 (GLOVE) ×1 IMPLANT
GLOVE BIOGEL PI INDICATOR 6 (GLOVE) ×1
GLOVE BIOGEL PI INDICATOR 6.5 (GLOVE) ×1
GLOVE NEODERM STRL 7.5  LF PF (GLOVE) ×2
GLOVE NEODERM STRL 7.5 LF PF (GLOVE) ×2 IMPLANT
GLOVE SURG NEODERM 7.5  LF PF (GLOVE) ×2
GOWN STRL REUS W/ TWL LRG LVL3 (GOWN DISPOSABLE) ×4 IMPLANT
GOWN STRL REUS W/TWL LRG LVL3 (GOWN DISPOSABLE) ×8
HEMOSTAT POWDER SURGIFOAM 1G (HEMOSTASIS) IMPLANT
HEMOSTAT SURGICEL 2X14 (HEMOSTASIS) IMPLANT
KIT BASIN OR (CUSTOM PROCEDURE TRAY) ×2 IMPLANT
KIT PREVENA INCISION MGT20CM45 (CANNISTER) ×2 IMPLANT
KIT SUCTION CATH 14FR (SUCTIONS) IMPLANT
KIT TURNOVER KIT B (KITS) ×2 IMPLANT
NS IRRIG 1000ML POUR BTL (IV SOLUTION) ×6 IMPLANT
PACK CHEST (CUSTOM PROCEDURE TRAY) ×2 IMPLANT
PACK E OPEN HEART (SUTURE) IMPLANT
PACK OPEN HEART (CUSTOM PROCEDURE TRAY) IMPLANT
PAD ARMBOARD 7.5X6 YLW CONV (MISCELLANEOUS) IMPLANT
PAD ELECT DEFIB RADIOL ZOLL (MISCELLANEOUS) ×2 IMPLANT
POSITIONER HEAD DONUT 9IN (MISCELLANEOUS) ×2 IMPLANT
STAPLER VISISTAT 35W (STAPLE) ×2 IMPLANT
SUT BONE WAX W31G (SUTURE) IMPLANT
SUT MNCRL AB 3-0 PS2 18 (SUTURE) ×4 IMPLANT
SUT PDS AB 1 CTX 36 (SUTURE) ×4 IMPLANT
SUT PROLENE 3 0 SH DA (SUTURE) IMPLANT
SUT PROLENE 5 0 C 1 36 (SUTURE) IMPLANT
SUT PROLENE 6 0 C 1 30 (SUTURE) IMPLANT
SUT PROLENE 8 0 BV175 6 (SUTURE) IMPLANT
SUT SILK  1 MH (SUTURE) ×2
SUT SILK 1 MH (SUTURE) ×1 IMPLANT
SUT SILK 2 0 SH CR/8 (SUTURE) IMPLANT
SUT SILK 3 0 SH CR/8 (SUTURE) IMPLANT
SUT STEEL 6MS V (SUTURE) ×2 IMPLANT
SUT STEEL SZ 6 DBL 3X14 BALL (SUTURE) ×2 IMPLANT
SUT VIC AB 2-0 CTX 27 (SUTURE) ×4 IMPLANT
SUT VIC AB 3-0 X1 27 (SUTURE) IMPLANT
SYSTEM SAHARA CHEST DRAIN ATS (WOUND CARE) ×2 IMPLANT
TAPE CLOTH SURG 4X10 WHT LF (GAUZE/BANDAGES/DRESSINGS) ×2 IMPLANT
TAPE PAPER 2X10 WHT MICROPORE (GAUZE/BANDAGES/DRESSINGS) ×2 IMPLANT
TOWEL GREEN STERILE (TOWEL DISPOSABLE) ×2 IMPLANT
TRAY FOLEY SLVR 16FR TEMP STAT (SET/KITS/TRAYS/PACK) IMPLANT
UNDERPAD 30X30 (UNDERPADS AND DIAPERS) IMPLANT
WATER STERILE IRR 1000ML POUR (IV SOLUTION) ×4 IMPLANT
WATER STERILE IRR 1000ML UROMA (IV SOLUTION) IMPLANT

## 2019-08-16 NOTE — Transfer of Care (Signed)
Immediate Anesthesia Transfer of Care Note  Patient: Christian [name]  Procedure(s) Performed: Chest Closure S?P CABG WITH APPLICATION OF PREVENA  INCISIONAL WOUND VAC (N/A Chest)  Patient Location: ICU  Anesthesia Type:General  Level of Consciousness: sedated, unresponsive and Patient remains intubated per anesthesia plan  Airway & Oxygen Therapy: Patient remains intubated per anesthesia plan and Patient placed on Ventilator (see vital sign flow sheet for setting)  Post-op Assessment: Report given to RN and Post -op Vital signs reviewed and stable  Post vital signs: Reviewed and stable  Last Vitals:  Vitals Value Taken Time  BP    Temp    Pulse 117 08/16/19 1819  Resp 9 08/16/19 1819  SpO2 99 % 08/16/19 1819  Vitals shown include unvalidated device data.  Last Pain:  Vitals:   08/16/19 1600  TempSrc: Core  PainSc:          Complications: No apparent anesthesia complications

## 2019-08-16 NOTE — Anesthesia Postprocedure Evaluation (Signed)
Anesthesia Post Note  Patient: Christian [name]  Procedure(s) Performed: Chest Closure S?P CABG WITH APPLICATION OF PREVENA  INCISIONAL WOUND VAC (N/A Chest)     Patient location during evaluation: SICU Anesthesia Type: General Level of consciousness: patient remains intubated per anesthesia plan Pain management: pain level controlled Vital Signs Assessment: post-procedure vital signs reviewed and stable Respiratory status: patient on ventilator - see flowsheet for VS Cardiovascular status: blood pressure returned to baseline and stable Postop Assessment: no apparent nausea or vomiting Anesthetic complications: no    Last Vitals:  Vitals:   08/16/19 1600 08/16/19 1820  BP: (!) 202/185   Pulse: (!) 127   Resp: 16   Temp: 37.9 C   SpO2: 91% 99%    Last Pain:  Vitals:   08/16/19 1600  TempSrc: Core  PainSc:                  Alleen Kehm DAVID

## 2019-08-16 NOTE — Progress Notes (Signed)
1 Day Post-Op Procedure(s) (LRB): CORONARY ARTERY BYPASS GRAFTING (CABG), x 4 using bilateral IMAs and left radial artery .  LIMA TO LAD, RIMA TO PDA, RADIAL ARTERY TO CIRC AND SEQUENTIALLY TO OM1. (N/A) TRANSESOPHAGEAL ECHOCARDIOGRAM (TEE) (N/A) INDOCYANINE GREEN FLUORESCENCE IMAGING (ICG) (N/A) Radial Artery Harvest (Left) Subjective: intubated  Objective: Vital signs in last 24 hours: Temp:  [99.1 F (37.3 C)-100.6 F (38.1 C)] 100.2 F (37.9 C) (04/21 0800) Pulse Rate:  [94-122] 122 (04/21 0800) Cardiac Rhythm: Sinus tachycardia (04/21 0800) Resp:  [14-23] 16 (04/21 0800) BP: (86-160)/(51-107) 112/72 (04/21 0800) SpO2:  [57 %-100 %] 100 % (04/21 0800) Arterial Line BP: (89-309)/(36-93) 111/60 (04/21 0800) FiO2 (%):  [50 %] 50 % (04/21 0355) Weight:  [105.6 kg] 105.6 kg (04/21 0436)  Hemodynamic parameters for last 24 hours: PAP: (22-36)/(13-23) 24/13 CVP:  [10 mmHg-19 mmHg] 11 mmHg CO:  [4.1 L/min-5.4 L/min] 5.4 L/min CI:  [1.9 L/min/m2-2.5 L/min/m2] 2.5 L/min/m2  Intake/Output from previous day: 04/20 0701 - 04/21 0700 In: 6280.4 [I.V.:3838.2; Blood:833; IV Piggyback:1609.2] Out: 3600 [Urine:2830; Chest Tube:770] Intake/Output this shift: Total I/O In: 371.1 [I.V.:371.1] Out: 80 [Urine:30; Chest Tube:50]  General appearance: sedated Neurologic: intact Heart: regular rate and rhythm, S1, S2 normal, no murmur, click, rub or gallop Lungs: clear to auscultation bilaterally Abdomen: soft, non-tender; bowel sounds normal; no masses,  no organomegaly Extremities: edema 1+ Wound: dressed  Lab Results: Recent Labs    08/15/19 1958 08/15/19 1958 08/16/19 0413 08/16/19 0423  WBC 8.9  --  8.7  --   HGB 9.0*   < > 8.3* 7.5*  HCT 27.4*   < > 25.4* 22.0*  PLT 112*  --  108*  --    < > = values in this interval not displayed.   BMET:  Recent Labs    08/15/19 1958 08/15/19 1958 08/16/19 0413 08/16/19 0423  NA 142   < > 141 142  K 4.5   < > 4.0 3.9  CL 112*  --   112*  --   CO2 24  --  24  --   GLUCOSE 135*  --  118*  --   BUN 6*  --  7*  --   CREATININE 1.14  --  1.30*  --   CALCIUM 7.5*  --  7.5*  --    < > = values in this interval not displayed.    PT/INR:  Recent Labs    08/15/19 1403  LABPROT 17.5*  INR 1.5*   ABG    Component Value Date/Time   PHART 7.408 08/16/2019 0423   HCO3 27.0 08/16/2019 0423   TCO2 28 08/16/2019 0423   ACIDBASEDEF 1.0 08/15/2019 1805   O2SAT 100.0 08/16/2019 0423   CBG (last 3)  Recent Labs    08/16/19 0614 08/16/19 0708 08/16/19 0749  GLUCAP 95 102* 103*    Assessment/Plan: S/P Procedure(s) (LRB): CORONARY ARTERY BYPASS GRAFTING (CABG), x 4 using bilateral IMAs and left radial artery .  LIMA TO LAD, RIMA TO PDA, RADIAL ARTERY TO CIRC AND SEQUENTIALLY TO OM1. (N/A) TRANSESOPHAGEAL ECHOCARDIOGRAM (TEE) (N/A) INDOCYANINE GREEN FLUORESCENCE IMAGING (ICG) (N/A) Radial Artery Harvest (Left) 2 units PrBC txfusion  Lasix gtt Repeat labs this pm Consider chest closure this pm Melah Ebling Z. Orvan Seen, MD [phone]   LOS: 7 days    [name] 08/16/2019

## 2019-08-16 NOTE — Plan of Care (Signed)
  Problem: Education: Goal: Knowledge of General Education information will improve Description: Including pain rating scale, medication(s)/side effects and non-pharmacologic comfort measures Outcome: Progressing   Problem: Health Behavior/Discharge Planning: Goal: Ability to manage health-related needs will improve Outcome: Progressing   Problem: Clinical Measurements: Goal: Ability to maintain clinical measurements within normal limits will improve Outcome: Progressing Goal: Will remain free from infection Outcome: Progressing Goal: Diagnostic test results will improve Outcome: Progressing Goal: Respiratory complications will improve Outcome: Progressing Goal: Cardiovascular complication will be avoided Outcome: Progressing   Problem: Activity: Goal: Risk for activity intolerance will decrease Outcome: Progressing   Problem: Nutrition: Goal: Adequate nutrition will be maintained Outcome: Progressing   Problem: Coping: Goal: Level of anxiety will decrease Outcome: Progressing   Problem: Elimination: Goal: Will not experience complications related to bowel motility Outcome: Progressing Goal: Will not experience complications related to urinary retention Outcome: Progressing   Problem: Pain Managment: Goal: General experience of comfort will improve Outcome: Progressing   Problem: Safety: Goal: Ability to remain free from injury will improve Outcome: Progressing   Problem: Skin Integrity: Goal: Risk for impaired skin integrity will decrease Outcome: Progressing   Problem: Education: Goal: Will demonstrate proper wound care and an understanding of methods to prevent future damage Outcome: Progressing Goal: Knowledge of disease or condition will improve Outcome: Progressing Goal: Knowledge of the prescribed therapeutic regimen will improve Outcome: Progressing Goal: Individualized Educational Video(s) Outcome: Progressing   Problem: Activity: Goal: Risk for  activity intolerance will decrease Outcome: Progressing   Problem: Cardiac: Goal: Will achieve and/or maintain hemodynamic stability Outcome: Progressing   Problem: Clinical Measurements: Goal: Postoperative complications will be avoided or minimized Outcome: Progressing   Problem: Respiratory: Goal: Respiratory status will improve Outcome: Progressing   Problem: Skin Integrity: Goal: Wound healing without signs and symptoms of infection Outcome: Progressing Goal: Risk for impaired skin integrity will decrease Outcome: Progressing   Problem: Urinary Elimination: Goal: Ability to achieve and maintain adequate renal perfusion and functioning will improve Outcome: Progressing   

## 2019-08-16 NOTE — Progress Notes (Addendum)
      [address]       [place] [address]             [phone]      POD # 1 CABG x 4, just back from sternal closure  IMV 16, 50%, 7 PEEP   BP (!) 202/185 (BP Location: Right Wrist)   Pulse (!) 119   Temp 99.3 F (37.4 C)   Resp 16   Ht 5\' 9"  (1.753 m)   Wt 105.6 kg   SpO2 99%   BMI 34.38 kg/m  Actual BP 132/65 26/14 CI = 2.3 Milrinone 0.375, norepi off   Intake/Output Summary (Last 24 hours) at 08/16/2019 1845 Last data filed at 08/16/2019 1821 Gross per 24 hour  Intake 3896.8 ml  Output 3015 ml  Net 881.8 ml   Lasix drip @ 8  Hct- 35, K= 4.0, creatinine 1.31 CBG well controlled  Continue current Rx  Peyten Punches C. Roxan Hockey, MD [facility] [phone]

## 2019-08-16 NOTE — Progress Notes (Signed)
Pt care handed over to OR team. Vent report given to [employer]. Pt being bagged to OR with peep valve. RT will await arrival of pt back to unit.

## 2019-08-16 NOTE — Anesthesia Preprocedure Evaluation (Signed)
Anesthesia Evaluation  Patient identified by MRN, date of birth, ID band Patient awake    Reviewed: Allergy & Precautions, NPO status , Patient's Chart, lab work & pertinent test results  Airway Mallampati: Intubated       Dental   Pulmonary former smoker,    Pulmonary exam normal        Cardiovascular hypertension, Pt. on medications + CAD, + Past MI and + CABG  Normal cardiovascular exam     Neuro/Psych    GI/Hepatic   Endo/Other  diabetes, Type 2  Renal/GU      Musculoskeletal   Abdominal   Peds  Hematology   Anesthesia Other Findings   Reproductive/Obstetrics                             Anesthesia Physical Anesthesia Plan  ASA: III  Anesthesia Plan: General   Post-op Pain Management:    Induction: Intravenous  PONV Risk Score and Plan:   Airway Management Planned: Oral ETT  Additional Equipment:   Intra-op Plan:   Post-operative Plan: Post-operative intubation/ventilation  Informed Consent: I have reviewed the patients History and Physical, chart, labs and discussed the procedure including the risks, benefits and alternatives for the proposed anesthesia with the patient or authorized representative who has indicated his/her understanding and acceptance.       Plan Discussed with: CRNA and Surgeon  Anesthesia Plan Comments:         Anesthesia Quick Evaluation

## 2019-08-16 NOTE — H&P (Signed)
History and Physical Interval Note:  08/16/2019 4:50 PM  Christian [name]  has presented today for surgery, with the diagnosis of CAD.  The various methods of treatment have been discussed with the patient and family. After consideration of risks, benefits and other options for treatment, the patient has consented to  PROCEDURE: Sternal exploration and possible chest closure as a surgical intervention.  The patient's history has been reviewed, patient examined, no change in status, stable for surgery.  I have reviewed the patient's chart and labs.  Questions were answered to the patient's satisfaction.     [name]

## 2019-08-16 NOTE — Addendum Note (Signed)
Addendum  created 08/16/19 1525 by Josephine Igo, CRNA   Order list changed

## 2019-08-16 NOTE — Progress Notes (Signed)
Initial Nutrition Assessment  DOCUMENTATION CODES:   Not applicable  INTERVENTION:   Plan possible closure this afternoon. If unable to extubated in next 24-48 hrs recommend:   -Vital High Protein @ 45 ml/hr -60 ml Prostat BID  Provides: 1480 kcals (2034 kcal with propofol), 155 grams protein, 903 ml free water.   NUTRITION DIAGNOSIS:   Increased nutrient needs related to post-op healing as evidenced by estimated needs.  GOAL:   Patient will meet greater than or equal to 90% of their needs  MONITOR:   Vent status, Skin, TF tolerance, Weight trends, I & O's, Labs  REASON FOR ASSESSMENT:   Ventilator    ASSESSMENT:   Patient with PMH significant for CAD s/p stenting, HTN, HLD, DM, and BPH. Presents this admission with RLL PE and for CABG.   4/20- s/p CABG x4  Chest left open. Plan possible closure later today. No pressors. Remains on propofol. No feeding access. Recommend placement of OGT s/p closure if unable to extubate in 24-48 hrs.   Weight shows to increase over the last year. Utilize 95.5 kg as EDW.   Patient is currently intubated on ventilator support MV: 8.5 L/min Temp (24hrs), Avg:99.9 F (37.7 C), Min:99.1 F (37.3 C), Max:100.6 F (38.1 C)  Propofol: 21 ml/hr- provides 554 kcal from lipids daily    Drips: 250 mg lasix in D5 @ 8 ml/hr, insulin, milrinone, propofol Medications: dulcolax, colace  Labs: Mg 2.5 (H) CBG 95-130  Diet Order:   Diet Order            Diet NPO time specified  Diet effective now              EDUCATION NEEDS:   Not appropriate for education at this time  Skin:  Skin Assessment: Skin Integrity Issues: Skin Integrity Issues:: Other (Comment), Incisions Incisions: L arm Other: open chest  Last BM:  4/18  Height:   Ht Readings from Last 1 Encounters:  08/15/19 5\' 9"  (1.753 m)    Weight:   Wt Readings from Last 1 Encounters:  08/16/19 105.6 kg    BMI:  Body mass index is 34.38 kg/m.  Estimated  Nutritional Needs:   Kcal:  1867 kcal  Protein:  145-165 grams  Fluid:  >/= 1.8 L/day   Mariana Single RD, LDN Clinical Nutrition Pager listed in [place]

## 2019-08-16 NOTE — Anesthesia Procedure Notes (Signed)
Date/Time: 08/16/2019 5:00 PM Performed by: Eligha Bridegroom, CRNA Pre-anesthesia Checklist: Patient identified, Emergency Drugs available, Suction available and Patient being monitored Patient Re-evaluated:Patient Re-evaluated prior to induction Oxygen Delivery Method: Circle system utilized Preoxygenation: Pre-oxygenation with 100% oxygen Induction Type: Inhalational induction with existing ETT Tube size: 8.0 mm Placement Confirmation: positive ETCO2 and breath sounds checked- equal and bilateral Secured at: 23 cm Tube secured with: Tape Dental Injury: Teeth and Oropharynx as per pre-operative assessment

## 2019-08-16 NOTE — Progress Notes (Signed)
RT note-patient back from the OR, remains on current ventilator settings

## 2019-08-17 ENCOUNTER — Inpatient Hospital Stay (HOSPITAL_COMMUNITY): Payer: Medicare Other

## 2019-08-17 DIAGNOSIS — I214 Non-ST elevation (NSTEMI) myocardial infarction: Secondary | ICD-10-CM

## 2019-08-17 DIAGNOSIS — I5021 Acute systolic (congestive) heart failure: Secondary | ICD-10-CM | POA: Diagnosis not present

## 2019-08-17 LAB — BPAM RBC
Blood Product Expiration Date: 202105212359
Blood Product Expiration Date: 202105212359
Blood Product Expiration Date: 202105212359
Blood Product Expiration Date: 202105212359
Blood Product Expiration Date: 202105212359
Blood Product Expiration Date: 202105212359
ISSUE DATE / TIME: 202104200923
ISSUE DATE / TIME: 202104200924
ISSUE DATE / TIME: 202104201113
ISSUE DATE / TIME: 202104201113
ISSUE DATE / TIME: 202104210830
ISSUE DATE / TIME: 202104211116
Unit Type and Rh: 5100
Unit Type and Rh: 5100
Unit Type and Rh: 5100
Unit Type and Rh: 5100
Unit Type and Rh: 5100
Unit Type and Rh: 5100

## 2019-08-17 LAB — TYPE AND SCREEN
ABO/RH(D): O POS
Antibody Screen: NEGATIVE
Unit division: 0
Unit division: 0
Unit division: 0
Unit division: 0
Unit division: 0
Unit division: 0

## 2019-08-17 LAB — GLUCOSE, CAPILLARY
Glucose-Capillary: 118 mg/dL — ABNORMAL HIGH (ref 70–99)
Glucose-Capillary: 121 mg/dL — ABNORMAL HIGH (ref 70–99)
Glucose-Capillary: 127 mg/dL — ABNORMAL HIGH (ref 70–99)
Glucose-Capillary: 130 mg/dL — ABNORMAL HIGH (ref 70–99)
Glucose-Capillary: 134 mg/dL — ABNORMAL HIGH (ref 70–99)
Glucose-Capillary: 136 mg/dL — ABNORMAL HIGH (ref 70–99)
Glucose-Capillary: 143 mg/dL — ABNORMAL HIGH (ref 70–99)
Glucose-Capillary: 146 mg/dL — ABNORMAL HIGH (ref 70–99)
Glucose-Capillary: 147 mg/dL — ABNORMAL HIGH (ref 70–99)
Glucose-Capillary: 147 mg/dL — ABNORMAL HIGH (ref 70–99)
Glucose-Capillary: 148 mg/dL — ABNORMAL HIGH (ref 70–99)
Glucose-Capillary: 148 mg/dL — ABNORMAL HIGH (ref 70–99)
Glucose-Capillary: 155 mg/dL — ABNORMAL HIGH (ref 70–99)
Glucose-Capillary: 174 mg/dL — ABNORMAL HIGH (ref 70–99)
Glucose-Capillary: 178 mg/dL — ABNORMAL HIGH (ref 70–99)
Glucose-Capillary: 191 mg/dL — ABNORMAL HIGH (ref 70–99)
Glucose-Capillary: 213 mg/dL — ABNORMAL HIGH (ref 70–99)
Glucose-Capillary: >600 mg/dL (ref 70–99)

## 2019-08-17 LAB — MAGNESIUM: Magnesium: 2.2 mg/dL (ref 1.7–2.4)

## 2019-08-17 LAB — CBC WITH DIFFERENTIAL/PLATELET
Abs Immature Granulocytes: 0.18 10*3/uL — ABNORMAL HIGH (ref 0.00–0.07)
Basophils Absolute: 0 10*3/uL (ref 0.0–0.1)
Basophils Relative: 0 %
Eosinophils Absolute: 0.1 10*3/uL (ref 0.0–0.5)
Eosinophils Relative: 0 %
HCT: 34.6 % — ABNORMAL LOW (ref 39.0–52.0)
Hemoglobin: 11.4 g/dL — ABNORMAL LOW (ref 13.0–17.0)
Immature Granulocytes: 1 %
Lymphocytes Relative: 7 %
Lymphs Abs: 1 10*3/uL (ref 0.7–4.0)
MCH: 30.9 pg (ref 26.0–34.0)
MCHC: 32.9 g/dL (ref 30.0–36.0)
MCV: 93.8 fL (ref 80.0–100.0)
Monocytes Absolute: 1.5 10*3/uL — ABNORMAL HIGH (ref 0.1–1.0)
Monocytes Relative: 11 %
Neutro Abs: 11 10*3/uL — ABNORMAL HIGH (ref 1.7–7.7)
Neutrophils Relative %: 81 %
Platelets: 128 10*3/uL — ABNORMAL LOW (ref 150–400)
RBC: 3.69 MIL/uL — ABNORMAL LOW (ref 4.22–5.81)
RDW: 18.8 % — ABNORMAL HIGH (ref 11.5–15.5)
WBC: 13.7 10*3/uL — ABNORMAL HIGH (ref 4.0–10.5)
nRBC: 0 % (ref 0.0–0.2)

## 2019-08-17 LAB — COMPREHENSIVE METABOLIC PANEL
ALT: 25 U/L (ref 0–44)
AST: 41 U/L (ref 15–41)
Albumin: 2.3 g/dL — ABNORMAL LOW (ref 3.5–5.0)
Alkaline Phosphatase: 53 U/L (ref 38–126)
Anion gap: 11 (ref 5–15)
BUN: 13 mg/dL (ref 8–23)
CO2: 21 mmol/L — ABNORMAL LOW (ref 22–32)
Calcium: 7.7 mg/dL — ABNORMAL LOW (ref 8.9–10.3)
Chloride: 106 mmol/L (ref 98–111)
Creatinine, Ser: 1.99 mg/dL — ABNORMAL HIGH (ref 0.61–1.24)
GFR calc Af Amer: 38 mL/min — ABNORMAL LOW (ref >60)
GFR calc non Af Amer: 33 mL/min — ABNORMAL LOW (ref >60)
Glucose, Bld: 156 mg/dL — ABNORMAL HIGH (ref 70–99)
Potassium: 4.1 mmol/L (ref 3.5–5.1)
Sodium: 138 mmol/L (ref 135–145)
Total Bilirubin: 0.8 mg/dL (ref 0.3–1.2)
Total Protein: 5.2 g/dL — ABNORMAL LOW (ref 6.5–8.1)

## 2019-08-17 LAB — RENAL FUNCTION PANEL
Albumin: 2.5 g/dL — ABNORMAL LOW (ref 3.5–5.0)
Anion gap: 10 (ref 5–15)
BUN: 10 mg/dL (ref 8–23)
CO2: 21 mmol/L — ABNORMAL LOW (ref 22–32)
Calcium: 7.9 mg/dL — ABNORMAL LOW (ref 8.9–10.3)
Chloride: 106 mmol/L (ref 98–111)
Creatinine, Ser: 1.55 mg/dL — ABNORMAL HIGH (ref 0.61–1.24)
GFR calc Af Amer: 51 mL/min — ABNORMAL LOW (ref >60)
GFR calc non Af Amer: 44 mL/min — ABNORMAL LOW (ref >60)
Glucose, Bld: 147 mg/dL — ABNORMAL HIGH (ref 70–99)
Phosphorus: 2.8 mg/dL (ref 2.5–4.6)
Potassium: 4.2 mmol/L (ref 3.5–5.1)
Sodium: 137 mmol/L (ref 135–145)

## 2019-08-17 LAB — POCT I-STAT 7, (LYTES, BLD GAS, ICA,H+H)
Acid-base deficit: 1 mmol/L (ref 0.0–2.0)
Bicarbonate: 24 mmol/L (ref 20.0–28.0)
Calcium, Ion: 1.15 mmol/L (ref 1.15–1.40)
HCT: 32 % — ABNORMAL LOW (ref 39.0–52.0)
Hemoglobin: 10.9 g/dL — ABNORMAL LOW (ref 13.0–17.0)
O2 Saturation: 100 %
Patient temperature: 38.3
Potassium: 4.2 mmol/L (ref 3.5–5.1)
Sodium: 138 mmol/L (ref 135–145)
TCO2: 25 mmol/L (ref 22–32)
pCO2 arterial: 40.4 mmHg (ref 32.0–48.0)
pH, Arterial: 7.388 (ref 7.350–7.450)
pO2, Arterial: 176 mmHg — ABNORMAL HIGH (ref 83.0–108.0)

## 2019-08-17 LAB — COOXEMETRY PANEL
Carboxyhemoglobin: 1.2 % (ref 0.5–1.5)
Methemoglobin: 1.4 % (ref 0.0–1.5)
O2 Saturation: 70.1 %
Total hemoglobin: 11.8 g/dL — ABNORMAL LOW (ref 12.0–16.0)

## 2019-08-17 MED ORDER — METHYLPREDNISOLONE SODIUM SUCC 125 MG IJ SOLR
60.0000 mg | Freq: Four times a day (QID) | INTRAMUSCULAR | Status: DC
  Administered 2019-08-17 – 2019-08-18 (×6): 60 mg via INTRAVENOUS
  Filled 2019-08-17 (×7): qty 2

## 2019-08-17 MED ORDER — AMIODARONE LOAD VIA INFUSION
150.0000 mg | Freq: Once | INTRAVENOUS | Status: AC
  Administered 2019-08-17: 150 mg via INTRAVENOUS

## 2019-08-17 MED ORDER — NOREPINEPHRINE 4 MG/250ML-% IV SOLN
0.0000 ug/min | INTRAVENOUS | Status: DC
  Administered 2019-08-17: 2 ug/min via INTRAVENOUS
  Administered 2019-08-18: 3 ug/min via INTRAVENOUS
  Administered 2019-08-18: 4 ug/min via INTRAVENOUS
  Filled 2019-08-17 (×5): qty 250

## 2019-08-17 MED ORDER — AMIODARONE HCL IN DEXTROSE 360-4.14 MG/200ML-% IV SOLN
INTRAVENOUS | Status: AC
  Filled 2019-08-17: qty 200

## 2019-08-17 MED ORDER — AMIODARONE IV BOLUS ONLY 150 MG/100ML
150.0000 mg | Freq: Once | INTRAVENOUS | Status: AC
  Administered 2019-08-17: 150 mg via INTRAVENOUS

## 2019-08-17 MED ORDER — MIDAZOLAM HCL 2 MG/2ML IJ SOLN
1.0000 mg | Freq: Once | INTRAMUSCULAR | Status: AC
  Administered 2019-08-17: 1 mg via INTRAVENOUS

## 2019-08-17 MED ORDER — AMIODARONE HCL IN DEXTROSE 360-4.14 MG/200ML-% IV SOLN
30.0000 mg/h | INTRAVENOUS | Status: DC
  Administered 2019-08-17 – 2019-08-18 (×3): 30 mg/h via INTRAVENOUS
  Administered 2019-08-18: 60 mg/h via INTRAVENOUS
  Administered 2019-08-19 – 2019-08-23 (×10): 30 mg/h via INTRAVENOUS
  Filled 2019-08-17 (×5): qty 200
  Filled 2019-08-17: qty 400
  Filled 2019-08-17 (×9): qty 200

## 2019-08-17 MED ORDER — AMIODARONE HCL IN DEXTROSE 360-4.14 MG/200ML-% IV SOLN
60.0000 mg/h | INTRAVENOUS | Status: AC
  Administered 2019-08-17 (×2): 60 mg/h via INTRAVENOUS
  Filled 2019-08-17 (×2): qty 200

## 2019-08-17 MED ORDER — ATORVASTATIN CALCIUM 40 MG PO TABS
40.0000 mg | ORAL_TABLET | Freq: Every evening | ORAL | Status: DC
  Administered 2019-08-17: 40 mg via ORAL
  Filled 2019-08-17: qty 1

## 2019-08-17 MED ORDER — AMIODARONE LOAD VIA INFUSION
150.0000 mg | Freq: Once | INTRAVENOUS | Status: AC
  Administered 2019-08-17: 150 mg via INTRAVENOUS
  Filled 2019-08-17: qty 83.34

## 2019-08-17 MED ORDER — HEPARIN (PORCINE) 25000 UT/250ML-% IV SOLN
650.0000 [IU]/h | INTRAVENOUS | Status: DC
  Administered 2019-08-17: 500 [IU]/h via INTRAVENOUS
  Filled 2019-08-17: qty 250

## 2019-08-17 MED ORDER — NOREPINEPHRINE 4 MG/250ML-% IV SOLN
INTRAVENOUS | Status: AC
  Filled 2019-08-17: qty 250

## 2019-08-17 MED FILL — Sodium Chloride IV Soln 0.9%: INTRAVENOUS | Qty: 3000 | Status: AC

## 2019-08-17 MED FILL — Electrolyte-R (PH 7.4) Solution: INTRAVENOUS | Qty: 4000 | Status: AC

## 2019-08-17 MED FILL — Mannitol IV Soln 20%: INTRAVENOUS | Qty: 500 | Status: AC

## 2019-08-17 MED FILL — Albumin, Human Inj 5%: INTRAVENOUS | Qty: 250 | Status: AC

## 2019-08-17 MED FILL — Lidocaine HCl Local Soln Prefilled Syringe 100 MG/5ML (2%): INTRAMUSCULAR | Qty: 5 | Status: AC

## 2019-08-17 MED FILL — Sodium Bicarbonate IV Soln 8.4%: INTRAVENOUS | Qty: 50 | Status: AC

## 2019-08-17 MED FILL — Heparin Sodium (Porcine) Inj 1000 Unit/ML: INTRAMUSCULAR | Qty: 10 | Status: AC

## 2019-08-17 NOTE — Progress Notes (Signed)
Patient heartrate increased to 170's, sustained with drop in BP.  Patient given metoprolol, unsuccessful. EKG obtained and shown to Dr. Roxy Horseman.  Order for Amio bolus obtained, and notified Dr. Orvan Seen who will be arriving to the unit shortly.  Started Neo for BP and place pads on patient in leu of shocking patient to convert rhythm.  Amio bolus infusing.  Pending.

## 2019-08-17 NOTE — Progress Notes (Signed)
EVENING ROUNDS NOTE :     [address]       [place] [address]             [phone]                 1 Day Post-Op Procedure(s) (LRB): Chest Closure S?P CABG WITH APPLICATION OF PREVENA  INCISIONAL WOUND VAC (N/A)   Total Length of Stay:  LOS: 8 days  Events:   No events On lasix gtt Off levo Follows commands    BP 123/68   Pulse (!) 110   Temp 99 F (37.2 C)   Resp 19   Ht 5\' 9"  (1.753 m)   Wt 103.8 kg   SpO2 100%   BMI 33.79 kg/m   PAP: (18-45)/(2-34) 32/21 CVP:  [5 mmHg-28 mmHg] 15 mmHg CO:  [5 L/min-405 L/min] 5 L/min CI:  [2.1 L/min/m2-2.3 L/min/m2] 2.3 L/min/m2  Vent Mode: SIMV;PRVC;PSV FiO2 (%):  [50 %] 50 % Set Rate:  [16 bmp] 16 bmp Vt Set:  [560 mL] 560 mL PEEP:  [7 cmH20] 7 cmH20 Pressure Support:  [10 cmH20] 10 cmH20 Plateau Pressure:  [15 cmH20-19 cmH20] 19 cmH20  . sodium chloride Stopped (08/16/19 1631)  . sodium chloride    . sodium chloride    . sodium chloride 10 mL/hr at 08/17/19 1400  . amiodarone 30 mg/hr (08/17/19 1400)  . dexmedetomidine (PRECEDEX) IV infusion Stopped (08/15/19 1900)  . epinephrine Stopped (08/15/19 1405)  . furosemide (LASIX) infusion 8 mg/hr (08/17/19 1400)  . heparin 500 Units/hr (08/17/19 1608)  . insulin 2.6 mL/hr at 08/17/19 1400  . lactated ringers    . lactated ringers    . lactated ringers 20 mL/hr at 08/16/19 1600  . milrinone 0.25 mcg/kg/min (08/17/19 1400)  . nitroGLYCERIN Stopped (08/17/19 0137)  . norepinephrine (LEVOPHED) Adult infusion 3 mcg/min (08/17/19 1400)  . phenylephrine (NEO-SYNEPHRINE) Adult infusion Stopped (08/17/19 0830)  . propofol (DIPRIVAN) infusion 15 mcg/kg/min (08/17/19 1608)    I/O last 3 completed shifts: In: 4674.4 [I.V.:3075.7; Blood:630; NG/GT:150; IV Piggyback:818.8] Out: 0865 [Urine:4620; Emesis/NG output:200; Blood:15; Chest Tube:1030]   CBC Latest Ref Rng & Units 08/17/2019 08/17/2019 08/16/2019  WBC 4.0 - 10.5 K/uL - 13.7(H) -  Hemoglobin 13.0 -  17.0 g/dL 10.9(L) 11.4(L) 10.5(L)  Hematocrit 39.0 - 52.0 % 32.0(L) 34.6(L) 31.0(L)  Platelets 150 - 400 K/uL - 128(L) -    BMP Latest Ref Rng & Units 08/17/2019 08/17/2019 08/17/2019  Glucose 70 - 99 mg/dL 156(H) - 147(H)  BUN 8 - 23 mg/dL 13 - 10  Creatinine 0.61 - 1.24 mg/dL 1.99(H) - 1.55(H)  BUN/Creat Ratio 10 - 24 - - -  Sodium 135 - 145 mmol/L 138 138 137  Potassium 3.5 - 5.1 mmol/L 4.1 4.2 4.2  Chloride 98 - 111 mmol/L 106 - 106  CO2 22 - 32 mmol/L 21(L) - 21(L)  Calcium 8.9 - 10.3 mg/dL 7.7(L) - 7.9(L)    ABG    Component Value Date/Time   PHART 7.388 08/17/2019 0405   PCO2ART 40.4 08/17/2019 0405   PO2ART 176 (H) 08/17/2019 0405   HCO3 24.0 08/17/2019 0405   TCO2 25 08/17/2019 0405   ACIDBASEDEF 1.0 08/17/2019 0405   O2SAT 70.1 08/17/2019 0405   O2SAT 100.0 08/17/2019 0405       Melodie Bouillon, MD 08/17/2019 5:30 PM

## 2019-08-17 NOTE — Op Note (Signed)
Procedure(s): Chest Closure S?P CABG WITH APPLICATION OF PREVENA  INCISIONAL WOUND VAC Procedure Note  Christian [name] male 74 y.o. 08/17/2019  Procedure(s) and Anesthesia Type:    * Chest Closure S/P CABG WITH APPLICATION OF PREVENA  INCISIONAL WOUND VAC - General  Surgeon(s) and Role:    [name], MD - Primary   Indications: The patient is s/p CABG the day prior and the chest was left opened due to swelling and RV compression. He is now taken to the OR for attempted chest closure.      Surgeon: [name]   Assistants: staff  Anesthesia: General endotracheal anesthesia  ASA Class: 4    Procedure Detail  Chest Closure S/P CABG WITH APPLICATION OF PREVENA  INCISIONAL WOUND VAC After informed consent, the patient was taken to the operating room at [facility] on the date noted above.  He was placed in the supine position on the operating table.  Anesthesia was begun in a smooth fashion with a general endotracheal technique.  After confirming adequate anesthesia, the anterior chest was cleansed and draped with Betadine solution.  A preop surgical pause was performed.  The existing Esmark bandage was taken down.  The mediastinum was copiously irrigated.  A second chest tube was placed within the mediastinum.  Trial closure of the chest was performed after placement of a single sternal wire and the patient tolerated this hemodynamically.  Therefore the decision was made to close the chest definitively.  This was done in a routine fashion with stainless steel wires.  Tissue overlying the sternum was copiously irrigated and Repaired in layers.  Staples were used to reapproximate the skin.  A Prevena incisional management system was placed on the closed incision as a sterile dressing.  All sponge instrument and needle counts were correct and I was present for all aspects of the procedure.  Estimated Blood Loss:  Minimal         Drains: 4 chest tubes           Blood Given: none          Specimens: no         Implants: none        Complications:  * No complications entered in OR log *         Disposition: ICU - intubated and critically ill.         Condition: stable

## 2019-08-17 NOTE — Progress Notes (Signed)
Dr. Orvan Seen at bedside.  Preparing to cardiovert.

## 2019-08-17 NOTE — Progress Notes (Signed)
Dr. Orvan Seen at bedside, patient shocked.  Converted back to a rate of 116.

## 2019-08-17 NOTE — OR Nursing (Signed)
20 CC SYRINGE MINUS PLUNGER REMOVED FROM PT'S CHEST BY DR. Orvan Seen.

## 2019-08-17 NOTE — Consult Note (Addendum)
Advanced Heart Failure Team Consult Note   Primary Physician: Hoyt Koch, MD PCP-Cardiologist:  Dr. Einar Gip CT Surgeon: Dr. Orvan Seen  Reason for Consultation: Acute Systolic Heart Failure/ Post Surgical Volume Management   HPI:    ANTWIONE PICOTTE is seen today for evaluation of acute systolic heart failure/ post surgical volume managment at the request of Dr. Orvan Seen, [place] surgery.   74 y/o male, followed by Dr. Einar Gip, w/ h/o CAD s/p PDA angioplasty + stent in 02/2014, HTN, HLD, T2DM and stage III CKD.   Presented to [facility] from PCP office on 4/14 for evaluation for chest pain. Chest CT showed small RLL PE and extensive 3V coronary calcifications. Also found to have elevated Hs troponins c/w NSTEMI (peaked at 3,309). LE venous dopplers negative for DVT. Started on IV heparin. 2D echo showed reduced LVEF 30-35% w/ G2DD and severe hypokinesis of the left ventricular, entire anterior wall and septal wall. RV normal. No strain. No significant valvular dysfunction. Had [place] on 4/15 which showed severe multivessel disease. Underwent CABG x 4 on 4/20 by Dr. Orvan Seen (LIMA-LAD, RIMA to PDA, Lt radial artery graft to 1st OM and distal OM branches of LCx). Upon attempting to close the sternum, the patient because hypotensive and there was little room in the chest for closure due to swelling and RV compression. Therefore, the chest was left open. A spacer was placed between the two sternal halves. Taken back to OR on 4/21 for chest closure.   Post operatively on milrinone 0.25 + NE 5.   Post operative course also c/b rapid atrial flutter. HR increased into the 170s today w/ subsequent hypotension. Started on amiodarone gtt and neosynephrine added for BP support. Had bedside cardioversion earlier today and back in NSR.   AHF team now consulted to assist w/ volume management. He is ~18 lb above his preop wt. CVP ~13. I/Os net + 5L. He was on lasix gtt at 8 mg/hr with good diuresis, -4L in UOP  yesterday. However gtt turned off this am due to hypotension w/ rapid AFL. Now back in NSR. He has been weaned off of Neo. Remains on NE 5 + Milrinone 0.25. MAPs stable in the 80s currently. Co-ox 70%.  Swan #s PAP 39/29 (31), CO 4.49. CI 2.09 by Thermo (2.94 by Fick Calculation)  He is intubated but awake and following commands. Sinus tach, 111 bpm currently.   Echo 08/10/19 IMPRESSIONS    1. Left ventricular ejection fraction, by estimation, is 30 to 35%. The  left ventricle has severely decreased function. The left ventricle  demonstrates global hypokinesis. Left ventricular diastolic parameters are  consistent with Grade II diastolic  dysfunction (pseudonormalization). Elevated left ventricular end-diastolic  pressure. There is severe hypokinesis of the left ventricular, entire  anterior wall and septal wall.  2. Right ventricular systolic function is normal. The right ventricular  size is normal. There is normal pulmonary artery systolic pressure. The  estimated right ventricular systolic pressure is 32.1 mmHg.  3. The mitral valve is normal in structure. Trivial mitral valve  regurgitation. No evidence of mitral stenosis.  4. The aortic valve is normal in structure. Aortic valve regurgitation is  not visualized. Mild aortic valve sclerosis is present, with no evidence  of aortic valve stenosis.  5. The inferior vena cava is dilated in size with >50% respiratory  variability, suggesting right atrial pressure of 8 mmHg.   Review of Systems: [y] = yes, [ ]  = no   . General:  Weight gain [ ] ; Weight loss [ ] ; Anorexia [ ] ; Fatigue [ ] ; Fever [ ] ; Chills [ ] ; Weakness [ ]   . Cardiac: Chest pain/pressure [ ] ; Resting SOB [ ] ; Exertional SOB [ ] ; Orthopnea [ ] ; Pedal Edema [ ] ; Palpitations [ ] ; Syncope [ ] ; Presyncope [ ] ; Paroxysmal nocturnal dyspnea[ ]   . Pulmonary: Cough [ ] ; Wheezing[ ] ; Hemoptysis[ ] ; Sputum [ ] ; Snoring [ ]   . GI: Vomiting[ ] ; Dysphagia[ ] ; Melena[ ] ;  Hematochezia [ ] ; Heartburn[ ] ; Abdominal pain [ ] ; Constipation [ ] ; Diarrhea [ ] ; BRBPR [ ]   . GU: Hematuria[ ] ; Dysuria [ ] ; Nocturia[ ]   . Vascular: Pain in legs with walking [ ] ; Pain in feet with lying flat [ ] ; Non-healing sores [ ] ; Stroke [ ] ; TIA [ ] ; Slurred speech [ ] ;  . Neuro: Headaches[ ] ; Vertigo[ ] ; Seizures[ ] ; Paresthesias[ ] ;Blurred vision [ ] ; Diplopia [ ] ; Vision changes [ ]   . Ortho/Skin: Arthritis [ ] ; Joint pain [ ] ; Muscle pain [ ] ; Joint swelling [ ] ; Back Pain [ ] ; Rash [ ]   . Psych: Depression[ ] ; Anxiety[ ]   . Heme: Bleeding problems [ ] ; Clotting disorders [ ] ; Anemia [ ]   . Endocrine: Diabetes [ ] ; Thyroid dysfunction[ ]   Home Medications Prior to Admission medications   Medication Sig Start Date End Date Taking? Authorizing Provider  aspirin EC 81 MG tablet Take 81 mg by mouth daily.   Yes [provider]  atorvastatin (LIPITOR) 20 MG tablet TAKE 1 TABLET BY MOUTH EVERY DAY IN THE EVENING Patient taking differently: Take 20 mg by mouth every evening.  06/22/19  Yes Adrian Prows, MD  cloNIDine (CATAPRES) 0.2 MG tablet TAKE 1 TABLET (0.2 MG TOTAL) BY MOUTH DAILY. 06/22/19  Yes Adrian Prows, MD  cromolyn (OPTICROM) 4 % ophthalmic solution Place 1 drop into both eyes 4 (four) times daily as needed (dry eyes).  02/02/17  Yes [provider]  empagliflozin (JARDIANCE) 10 MG TABS tablet Take 10 mg by mouth daily before breakfast. 07/25/19  Yes Marrian Salvage, FNP  furosemide (LASIX) 20 MG tablet TAKE 1 TABLET BY MOUTH EVERY DAY Patient taking differently: Take 20 mg by mouth daily.  08/01/19  Yes Adrian Prows, MD  isosorbide mononitrate (IMDUR) 60 MG 24 hr tablet Take 60 mg by mouth daily.   Yes [provider]  metFORMIN (GLUCOPHAGE) 1000 MG tablet TAKE 1 TABLET TWICE A DAY WITH A MEAL Patient taking differently: Take 1,000 mg by mouth 2 (two) times daily with a meal.  07/31/19  Yes Hoyt Koch, MD  metoprolol succinate (TOPROL-XL) 100  MG 24 hr tablet Take 1 tablet (100 mg total) by mouth daily. Take with or immediately following a meal. 07/24/19  Yes Adrian Prows, MD  nitroGLYCERIN (NITROSTAT) 0.4 MG SL tablet Place 1 tablet (0.4 mg total) under the tongue every 5 (five) minutes as needed for chest pain. 06/01/19  Yes Adrian Prows, MD  olmesartan-hydrochlorothiazide (BENICAR HCT) 40-25 MG tablet TAKE 1 TABLET BY MOUTH EVERY DAY Patient taking differently: Take 1 tablet by mouth daily.  07/03/19  Yes Adrian Prows, MD  PREVIDENT 5000 BOOSTER PLUS 1.1 % PSTE Take 1 application by mouth daily.  06/14/18  Yes [provider]  silodosin (RAPAFLO) 8 MG CAPS capsule Take 8 mg by mouth at bedtime. 07/27/19  Yes [provider]  spironolactone (ALDACTONE) 25 MG tablet Take 1 tablet (25 mg total) by mouth daily. 06/01/19 08/30/19 Yes  Adrian Prows, MD  sucralfate (CARAFATE) 1 GM/10ML suspension Take 10 mLs (1 g total) by mouth 4 (four) times daily -  with meals and at bedtime. 07/24/19  Yes Marrian Salvage, FNP  tamsulosin [facility]) 0.4 MG CAPS capsule Take 0.4 mg by mouth daily after breakfast.  05/20/18  Yes [provider]  Trospium Chloride 60 MG CP24 Take 1 capsule by mouth daily. 07/31/19  Yes [provider]  verapamil (VERELAN PM) 240 MG 24 hr capsule TAKE 1 CAPSULE BY MOUTH EVERY DAY Patient taking differently: Take 240 mg by mouth daily.  07/24/19  Yes Adrian Prows, MD    Past Medical History: Past Medical History:  Diagnosis Date  . BPH (benign prostatic hypertrophy)   . Coronary artery disease   . Diabetes mellitus without complication ([place])   . Dry eyes left  . Hiatal hernia   . 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  LIMA TO LAD, RIMA TO PDA, RADIAL ARTERY TO CIRC AND SEQUENTIALLY TO OM1. 08/15/2019  . Hyperlipidemia   . Hypertension   . Incomplete bladder emptying   . Nocturia   . Problems with swallowing pt states test at [facility] approx 2012 shows a gastric valve   dysfunction--  eats small bites and drink liquids slowly  . SOB (shortness of breath) on exertion     Past Surgical History: Past Surgical History:  Procedure Laterality Date  . CARDIAC CATHETERIZATION    . CORONARY ARTERY BYPASS GRAFT N/A 08/15/2019   Procedure: CORONARY ARTERY BYPASS GRAFTING (CABG), x 4 using bilateral IMAs and left radial artery .  LIMA TO LAD, RIMA TO PDA, RADIAL ARTERY TO CIRC AND SEQUENTIALLY TO OM1.;  Surgeon: [name], MD;  Location: [place];  Service: Open Heart Surgery;  Laterality: N/A;  . CORONARY STENT PLACEMENT  02/27/2014   distal rt/pd coronary       dr Einar Gip  . CYSTO/ BLADDER BIOPSY'S/ CAUTHERIZATION  01-14-2004  DR Gaynelle Arabian  . LEFT HEART CATH AND CORONARY ANGIOGRAPHY N/A 08/10/2019   Procedure: LEFT HEART CATH AND CORONARY ANGIOGRAPHY;  Surgeon: Nigel Mormon, MD;  Location: [place] CV LAB;  Service: Cardiovascular;  Laterality: N/A;  . LEFT HEART CATHETERIZATION WITH CORONARY ANGIOGRAM N/A 02/27/2014   Procedure: LEFT HEART CATHETERIZATION WITH CORONARY ANGIOGRAM;  Surgeon: Laverda Page, MD;  Location: [facility] CATH LAB;  Service: Cardiovascular;  Laterality: N/A;  . PERCUTANEOUS CORONARY STENT INTERVENTION (PCI-S)  02/27/2014   Procedure: PERCUTANEOUS CORONARY STENT INTERVENTION (PCI-S);  Surgeon: Laverda Page, MD;  Location: [facility] CATH LAB;  Service: Cardiovascular;;  rt PDA  3.0/28mm Promus stent  . RADIAL ARTERY HARVEST Left 08/15/2019   Procedure: Radial Artery Harvest;  Surgeon: [name], MD;  Location: [place];  Service: Open Heart Surgery;  Laterality: Left;  . TEE WITHOUT CARDIOVERSION N/A 08/15/2019   Procedure: TRANSESOPHAGEAL ECHOCARDIOGRAM (TEE);  Surgeon: [name], MD;  Location: [place];  Service: Open Heart Surgery;  Laterality: N/A;  . TRANSURETHRAL RESECTION OF PROSTATE  04/04/2012   Procedure: TRANSURETHRAL RESECTION OF THE PROSTATE WITH GYRUS INSTRUMENTS;  Surgeon: Ailene Rud, MD;  Location: [facility];  Service: Urology;  Laterality: N/A;  . TRANSURETHRAL RESECTION OF PROSTATE N/A 09/27/2014   Procedure: TRANSURETHRAL RESECTION OF THE PROSTATE ;  Surgeon: Carolan Clines, MD;  Location: [facility];  Service: Urology;  Laterality: N/A;  . UPPER GASTROINTESTINAL ENDOSCOPY      Family History:  Family History  Problem Relation Age of Onset  . Diabetes Mother   . Hypertension Mother   . Diabetes Father   . Diabetes Brother   . Hypertension Brother   . Diabetes Brother     Social History: Social History   Socioeconomic History  . Marital status: Married    Spouse name: Not on file  . Number of children: 2  . Years of education: Not on file  . Highest education level: Not on file  Occupational History  . Not on file  Tobacco Use  . Smoking status: Former Smoker    Packs/day: 0.50    Years: 20.00    Pack years: 10.00    Types: Cigarettes    Quit date: 03/28/1990    Years since quitting: 29.4  . Smokeless tobacco: Never Used  Substance and Sexual Activity  . Alcohol use: Yes    Alcohol/week: 0.0 standard drinks    Comment: 1 beer week rarely  . Drug use: No  . Sexual activity: Not Currently  Other Topics Concern  . Not on file  Social History Narrative  . Not on file   Social Determinants of Health   Financial Resource Strain:   . Difficulty of Paying Living Expenses:   Food Insecurity:   . Worried About Charity fundraiser in the Last Year:   . Arboriculturist in the Last Year:   Transportation Needs:   . Film/video editor (Medical):   Marland Kitchen Lack of Transportation (Non-Medical):   Physical Activity:   . Days of Exercise per Week:   . Minutes of Exercise per Session:   Stress:   . Feeling of Stress :   Social Connections:   . Frequency of Communication with Friends and Family:   . Frequency of Social Gatherings with Friends and Family:   . Attends Religious Services:   . Active Member of Clubs or Organizations:   . Attends Theatre manager Meetings:   Marland Kitchen Marital Status:     Allergies:  No Known Allergies  Objective:    Vital Signs:   Temp:  [99.3 F (37.4 C)-101.3 F (38.5 C)] 100.4 F (38 C) (04/22 0815) Pulse Rate:  [108-166] 109 (04/22 0815) Resp:  [16-31] 16 (04/22 0815) BP: (84-202)/(52-185) 122/66 (04/22 0800) SpO2:  [91 %-100 %] 100 % (04/22 0815) Arterial Line BP: (65-160)/(40-85) 101/60 (04/22 0815) FiO2 (%):  [50 %] 50 % (04/22 0800) Weight:  [103.8 kg] 103.8 kg (04/22 0428) Last BM Date: 08/13/19  Weight change: Filed Weights   08/15/19 0520 08/16/19 0436 08/17/19 0428  Weight: 100.2 kg 105.6 kg 103.8 kg    Intake/Output:   Intake/Output Summary (Last 24 hours) at 08/17/2019 0847 Last data filed at 08/17/2019 0800 Gross per 24 hour  Intake 2796.36 ml  Output 4995 ml  Net -2198.64 ml      Physical Exam    CVP 13 General:  Obese AAM, intubated. Awake on vent and following commands. HEENT: normal + ETT Neck: supple. + Rt IJ Swan cath, Thick neck, JVD assessment difficult Carotids 2+ bilat; no bruits. No lymphadenopathy or thyromegaly appreciated. Cor: PMI nondisplaced. Regular rhythm, tachy rate. No rubs, gallops or murmurs. Sternotomy site w/ wound vac + bilateral CTs Lungs: intubate and clear Abdomen: obese, soft, nontender, nondistended. No hepatosplenomegaly. No bruits or masses. Good bowel sounds. Extremities: no cyanosis, clubbing, rash, edema Neuro: intubated. Awake on vent. Responds to commands   Telemetry   Aflutter in the 160s earlier  this AM, s/p bedside cardioversion>>> now sinus tach 10s.   EKG    EKG earlier today showed 2:1 AFL, 168 bpm   Labs   Basic Metabolic Panel: Recent Labs  Lab 08/15/19 0508 08/15/19 0807 08/15/19 1255 08/15/19 1301 08/15/19 1958 08/15/19 1958 08/16/19 0413 08/16/19 0413 08/16/19 0423 08/16/19 1529 08/16/19 1841 08/17/19 0353 08/17/19 0405  NA 140   < > 142   < > 142   < > 141   < > 142 139 141 137 138  K 4.0   < >  4.3   < > 4.5   < > 4.0   < > 3.9 4.0 3.3* 4.2 4.2  CL 109   < > 106  --  112*  --  112*  --   --  108  --  106  --   CO2 24  --   --   --  24  --  24  --   --  23  --  21*  --   GLUCOSE 115*   < > 149*  --  135*  --  118*  --   --  144*  --  147*  --   BUN 7*   < > 5*  --  6*  --  7*  --   --  7*  --  10  --   CREATININE 1.23   < > 0.90  --  1.14  --  1.30*  --   --  1.31*  --  1.55*  --   CALCIUM 8.5*   < >  --   --  7.5*   < > 7.5*  --   --  7.6*  --  7.9*  --   MG  --   --   --   --  2.8*  --  2.5*  --   --  2.4  --  2.2  --   PHOS  --   --   --   --   --   --   --   --   --   --   --  2.8  --    < > = values in this interval not displayed.    Liver Function Tests: Recent Labs  Lab 08/14/19 1945 08/16/19 1529 08/17/19 0353  AST 35 46*  --   ALT 45* 26  --   ALKPHOS 70 47  --   BILITOT 0.5 0.6  --   PROT 5.8* 5.3*  --   ALBUMIN 3.2* 2.9* 2.5*   No results for input(s): LIPASE, AMYLASE in the last 168 hours. No results for input(s): AMMONIA in the last 168 hours.  CBC: Recent Labs  Lab 08/15/19 1403 08/15/19 1425 08/15/19 1958 08/15/19 1958 08/16/19 0413 08/16/19 0413 08/16/19 0423 08/16/19 1529 08/16/19 1841 08/17/19 0353 08/17/19 0405  WBC 11.1*  --  8.9  --  8.7  --   --  11.2*  --  13.7*  --   NEUTROABS  --   --   --   --   --   --   --   --   --  11.0*  --   HGB 11.0*   < > 9.0*   < > 8.3*   < > 7.5* 11.7* 10.5* 11.4* 10.9*  HCT 33.8*   < > 27.4*   < > 25.4*   < > 22.0* 35.3* 31.0* 34.6* 32.0*  MCV 98.5  --  98.2  --  99.2  --   --  92.7  --  93.8  --   PLT 102*  --  112*  --  108*  --   --  113*  --  128*  --    < > = values in this interval not displayed.    Cardiac Enzymes: No results for input(s): CKTOTAL, CKMB, CKMBINDEX, TROPONINI in the last 168 hours.  BNP: BNP (last 3 results) Recent Labs    08/09/19 1240  BNP 89.1    ProBNP (last 3 results) Recent Labs    11/23/18 1120 08/09/19 0922  PROBNP 18.0 78.0     CBG: Recent Labs  Lab  08/17/19 0244 08/17/19 0402 08/17/19 0509 08/17/19 0623 08/17/19 0725  GLUCAP 130* 147* 121* 118* 148*    Coagulation Studies: Recent Labs    08/14/19 1945 08/15/19 1403 08/16/19 1529  LABPROT 14.0 17.5* 14.9  INR 1.1 1.5* 1.2     Imaging   DG Abd 1 View  Result Date: 08/16/2019 CLINICAL DATA:  74 year old male with NG placement. EXAM: ABDOMEN - 1 VIEW COMPARISON:  Abdominal radiograph dated 08/13/2019. FINDINGS: Partially visualized enteric tube with tip and side-port in the left hemiabdomen. The tip is likely in the body of the stomach. Partially visualized support apparatus related to open heart surgery. IMPRESSION: Enteric tube with tip likely in the body of the stomach. Electronically Signed   By: Anner Crete M.D.   On: 08/16/2019 22:46   DG CHEST PORT 1 VIEW  Result Date: 08/17/2019 CLINICAL DATA:  History of CABG. EXAM: PORTABLE CHEST 1 VIEW COMPARISON:  08/16/2019. FINDINGS: Endotracheal tube, NG tube, Swan-Ganz catheter, mediastinal drainage catheter, bilateral chest tubes in stable position. Prior CABG. Cardiomegaly. Bilateral interstitial prominence, progressed from prior exam. Findings suggest mild interstitial edema. Pneumonitis cannot be excluded. No pleural effusion or pneumothorax. IMPRESSION: 1. Lines and tubes including bilateral chest tubes in stable position. No pneumothorax. 2. Prior CABG. Cardiomegaly. Increase interstitial prominence noted bilaterally consistent with interstitial edema. Electronically Signed   By: Marcello Moores  Register   On: 08/17/2019 07:04   DG Chest Port 1 View  Result Date: 08/16/2019 CLINICAL DATA:  Open heart surgery EXAM: PORTABLE CHEST 1 VIEW COMPARISON:  08/16/2019 FINDINGS: Changes of CABG. Support devices are stable including bilateral chest tubes. No pneumothorax. Cardiomegaly. Lingular atelectasis. IMPRESSION: Stable support devices.  No pneumothorax. Left base atelectasis. Electronically Signed   By: Rolm Baptise M.D.   On:  08/16/2019 19:11      Medications:     Current Medications: . sodium chloride   Intravenous Once  . acetaminophen  1,000 mg Oral Q6H   Or  . acetaminophen (TYLENOL) oral liquid 160 mg/5 mL  1,000 mg Per Tube Q6H  . aspirin EC  325 mg Oral Daily   Or  . aspirin  324 mg Per Tube Daily  . aspirin  300 mg Rectal Daily  . atorvastatin  20 mg Oral QPM  . bisacodyl  10 mg Oral Daily   Or  . bisacodyl  10 mg Rectal Daily  . chlorhexidine gluconate (MEDLINE KIT)  15 mL Mouth Rinse BID  . Chlorhexidine Gluconate Cloth  6 each Topical Daily  . docusate sodium  200 mg Oral Daily  . isosorbide dinitrate  5 mg Oral TID  . mouth rinse  15 mL Mouth Rinse 10 times per day  . methylPREDNISolone (SOLU-MEDROL) injection  60 mg Intravenous Q6H  . metoprolol tartrate  12.5 mg Oral BID   Or  .  metoprolol tartrate  12.5 mg Per Tube BID  . pantoprazole  40 mg Oral Daily  . sodium chloride flush  10-40 mL Intracatheter Q12H  . sodium chloride flush  3 mL Intravenous Q12H  . tamsulosin  0.4 mg Oral QPC breakfast  . trospium  20 mg Oral BID     Infusions: . sodium chloride Stopped (08/16/19 1631)  . sodium chloride    . sodium chloride    . sodium chloride 10 mL/hr at 08/17/19 0800  . amiodarone 60 mg/hr (08/17/19 0815)   Followed by  . amiodarone    . dexmedetomidine (PRECEDEX) IV infusion Stopped (08/15/19 1900)  . epinephrine Stopped (08/15/19 1405)  . furosemide (LASIX) infusion Stopped (08/17/19 0746)  . insulin 4.4 mL/hr at 08/17/19 0800  . lactated ringers    . lactated ringers    . lactated ringers 20 mL/hr at 08/16/19 1600  . milrinone 0.2 mcg/kg/min (08/17/19 0800)  . nitroGLYCERIN Stopped (08/17/19 0137)  . norepinephrine (LEVOPHED) Adult infusion 5 mcg/min (08/17/19 0800)  . phenylephrine (NEO-SYNEPHRINE) Adult infusion 30 mcg/min (08/17/19 0703)  . propofol (DIPRIVAN) infusion 15 mcg/kg/min (08/17/19 0800)      Assessment/Plan   1. CAD:  - s/p PDA angioplasty + stent  in 02/2014 - Admitted for NSTEMI 4/14. Hs Trop peaked at [place] w/ severe 3V CAD. Echo w/ reduced EF 30-35%. RV ok. - S/p CABG x 4 on 4/20 (LIMA-LAD, RIMA to PDA, Lt radial artery graft to 1st OM and distal OM branches of LCx) - continue ASA + statin   2. PE: - Chest CT 4/14 w/ small RLL PE - Echo shows normal RV. No strain - Bilateral Venous Dopplers negative for DVT - currently off IV heparin given recent surgery - will need to resume a/c once safe to do so  3. Acute Systolic Heart Failure/ Ischemic Cardiomyopathy  - LVEF severely reduced at 30-35%, in the setting of NSTEM/ Multivessel CAD. RV ok. - Volume overloaded post CABG. ~ 18 lb above pre-op wt. CVP 13. Net I/Os + 5L - Lasix gtt placed on hold due to transient hypotension related to rapid AFL. Now in NSR. MAPs stable in the 80s on low dose NE - Will resume Lasix gtt and continue NE for BP support.  - Follow CVPs and I/Os - Co-ox stable at 70% on 0.25 of milrinone. Will try to wean slowly  - Swan #s PAP 39/29 (31), CO 4.49. CI 2.09 by Thermo (2.94 by Fick Calculation)  4. Post Operative Atrial Flutter - s/p DCCV 4/22 - maintaining NSR - continue amiodarone drip - will need a/c, regardless, given PE - keep electrolytes stable while diuresing   5. T2DM - Hgb A1c 7.7 - insulin per primary team - consider future addition of an SGLT2i given concomitant systolic heart failure   6. AKI on Stage III CKD - baseline SCr 0.9, up to 1.5 today  - monitor w/ diuresis  - NE for BP support  Length of Stay: [address] PA-C  08/17/2019, 8:47 AM  Advanced Heart Failure Team Pager [phone] (M-F; 7a - 4p)  Please contact [facility] for night-coverage after hours (4p -7a ) and weekends on [email or web address]  Patient seen with PA, agree with the above note.   Patient has history of CKD stage 3, DM, HTN, hyperlipidemia.  He was admitted with NSTEMI earlier this month, 3VD on cath.  Echo with EF 30-35%, preserved RV  function.   He had CABG with anatomy as  above. Unable to close chest due to soft tissue swelling, was diuresed and went back to the OR on 4/21 to close chest.  Last night, had atrial flutter with RVR and was cardioverted back to NSR this morning.    Currently in NSR with CVP 14.  Co-ox 70% today with CI 2.1 from [place]. He remains on vent.  Lasix was stopped when he was in atrial flutter/RVR.  He remains on norepinephrine 5 and milrinone 0.2.    General: Sedated on vent.  Neck: JVP difficult, appears elevated around 12 cm; no thyromegaly or thyroid nodule.  Lungs: Clear to auscultation bilaterally with normal respiratory effort. CV: Nondisplaced PMI.  Heart regular S1/S2, no S3/S4, no murmur.  Trace ankle edema.  No carotid bruit.  Normal pedal pulses.  Abdomen: Soft, nontender, no hepatosplenomegaly, no distention.  Skin: Intact without lesions or rashes.  Neurologic: Sedated on vent Extremities: No clubbing or cyanosis.  HEENT: Normal.   1. CAD: NSTEMI with 3VD, now s/p CABG wiith LIMA-LAD, RIMA-PDA, seq left radial to OM1/PLOM.  Had delayed closure of chest wall on 4/21 due to volume overload.  - Continue ASA and statin.  2. Acute systolic CHF: Ischemic CMP.  Echo pre-op with EF 30-35%, normal RV size and systolic function.  He is volume overloaded, CVP is 14 currently with co-ox 70% and CI 2.1 (adequate).  He is still considerably above his pre-op weight.   - Restart Lasix 8 mg/hr, he did well on this dose yesterday.  - Continue milrinone 0.2 today.  - Wean norepinephrine as BP allows.  - Would stop beta blocker for now.  3. ID: Tm 101 overnight, now afebrile.  Hopefully inflammatory reaction to surgery.  4. Acute hypoxemic respiratory failure: CXR with pulmonary edema.  Diurese, hopefully extubate soon.  5. Atrial flutter: DCCV back to NSR this morning.  Post-op.  - Continue amiodarone gtt.  - Will anticoagulate for atrial flutter and PE when ok with Dr. Orvan Seen.  6. Pulmonary embolus:  Small RLL PE seen on CTA chest 4/14.   - Resume heparin gtt when ok with Dr. Orvan Seen, eventually would transition over to Eliquis.   Loralie Champagne 08/17/2019 11:21 AM

## 2019-08-17 NOTE — Progress Notes (Signed)
ANTICOAGULATION CONSULT NOTE - Follow Up Consult  Pharmacy Consult for Heparin Indication:  PE  No Known Allergies  Patient Measurements: Height: 5\' 9"  (175.3 cm) Weight: 103.8 kg (228 lb 13.4 oz) IBW/kg (Calculated) : 70.7 Heparin Dosing Weight: 90.6 kg  Vital Signs: Temp: 99.5 F (37.5 C) (04/22 1400) Temp Source: Core (04/22 0800) BP: 114/71 (04/22 1400) Pulse Rate: 106 (04/22 1400)  Labs: Recent Labs    08/14/19 1830 08/14/19 1945 08/14/19 1945 08/15/19 0508 08/15/19 0807 08/15/19 1403 08/15/19 1425 08/16/19 0413 08/16/19 0423 08/16/19 1529 08/16/19 1529 08/16/19 1841 08/16/19 1841 08/17/19 0353 08/17/19 0405 08/17/19 1348  HGB  --   --   --  9.8*   < > 11.0*   < > 8.3*   < > 11.7*   < > 10.5*   < > 11.4* 10.9*  --   HCT  --   --   --  30.5*   < > 33.8*   < > 25.4*   < > 35.3*   < > 31.0*  --  34.6* 32.0*  --   PLT  --   --   --  198   < > 102*   < > 108*  --  113*  --   --   --  128*  --   --   APTT  --  133*  --   --   --  32  --   --   --   --   --   --   --   --   --   --   LABPROT  --  14.0  --   --   --  17.5*  --   --   --  14.9  --   --   --   --   --   --   INR  --  1.1  --   --   --  1.5*  --   --   --  1.2  --   --   --   --   --   --   HEPARINUNFRC 0.65  --   --  0.72*  --   --   --   --   --   --   --   --   --   --   --   --   CREATININE  --  1.18   < > 1.23   < >  --    < > 1.30*   < > 1.31*  --   --   --  1.55*  --  1.99*   < > = values in this interval not displayed.    Estimated Creatinine Clearance: 39.8 mL/min (A) (by C-G formula based on SCr of 1.99 mg/dL (H)).  Assessment: 74 year-old male on heparin for NSTEMI, s/p CABG on 4/20. CT chest confirmed PE. Dopplers negative for DVT. No anticoagulation prior to admission.   Hgb 11.4, pltc 128. No bleeding noted.   After discussing with Dr. Orvan Seen, will resume low dose heparin for PE this afternoon.   Goal of Therapy:  Heparin level 0.3-0.7 units/ml Monitor platelets by anticoagulation  protocol: Yes   Plan:  - Start heparin 500 units/hr  - Daily heparin level and CBC - Will continue to monitor for any signs/symptoms of bleeding  - F/u long term anticoagulation plans  Thank you for allowing pharmacy to be a part of this patient's care.  Vertis Kelch, PharmD,  BCPS PGY2 Cardiology Pharmacy Resident Phone [phone] 08/17/2019       2:45 PM  Please check [email or web address] for unit-specific pharmacist phone numbers

## 2019-08-17 NOTE — Progress Notes (Signed)
1 Day Post-Op Procedure(s) (LRB): Chest Closure S?P CABG WITH APPLICATION OF PREVENA  INCISIONAL WOUND VAC (N/A) Subjective: intubated Objective: Vital signs in last 24 hours: Temp:  [99.3 F (37.4 C)-101.3 F (38.5 C)] 101.3 F (38.5 C) (04/22 0545) Pulse Rate:  [109-135] 109 (04/22 0545) Cardiac Rhythm: Sinus tachycardia (04/21 2000) Resp:  [16-26] 26 (04/22 0545) BP: (93-202)/(58-185) 107/69 (04/22 0530) SpO2:  [91 %-100 %] 100 % (04/22 0545) Arterial Line BP: (65-160)/(40-85) 74/49 (04/22 0545) FiO2 (%):  [50 %] 50 % (04/22 0401) Weight:  [103.8 kg] 103.8 kg (04/22 0428)  Hemodynamic parameters for last 24 hours: PAP: (19-41)/(2-30) 29/22 CVP:  [5 mmHg-23 mmHg] 20 mmHg CO:  [4.1 L/min-5 L/min] 5 L/min CI:  [1.9 L/min/m2-2.3 L/min/m2] 2.3 L/min/m2  Intake/Output from previous day: 04/21 0701 - 04/22 0700 In: 3132.5 [I.V.:2252.5; Blood:630; NG/GT:150; IV Piggyback:100] Out: 5784 [Urine:4025; Emesis/NG output:200; Blood:15; Chest Tube:630] Intake/Output this shift: No intake/output data recorded.  General appearance: sedated Neurologic: unable to fully assess Heart: tacycardiac Lungs: diminished breath sounds bilaterally Abdomen: soft, non-tender; bowel sounds normal; no masses,  no organomegaly Extremities: edema 2+ Wound: dressed  Lab Results: Recent Labs    08/16/19 1529 08/16/19 1841 08/17/19 0353 08/17/19 0405  WBC 11.2*  --  13.7*  --   HGB 11.7*   < > 11.4* 10.9*  HCT 35.3*   < > 34.6* 32.0*  PLT 113*  --  128*  --    < > = values in this interval not displayed.   BMET:  Recent Labs    08/16/19 1529 08/16/19 1841 08/17/19 0353 08/17/19 0405  NA 139   < > 137 138  K 4.0   < > 4.2 4.2  CL 108  --  106  --   CO2 23  --  21*  --   GLUCOSE 144*  --  147*  --   BUN 7*  --  10  --   CREATININE 1.31*  --  1.55*  --   CALCIUM 7.6*  --  7.9*  --    < > = values in this interval not displayed.    PT/INR:  Recent Labs    08/16/19 1529  LABPROT  14.9  INR 1.2   ABG    Component Value Date/Time   PHART 7.388 08/17/2019 0405   HCO3 24.0 08/17/2019 0405   TCO2 25 08/17/2019 0405   ACIDBASEDEF 1.0 08/17/2019 0405   O2SAT 70.1 08/17/2019 0405   O2SAT 100.0 08/17/2019 0405   CBG (last 3)  Recent Labs    08/17/19 0509 08/17/19 0623 08/17/19 0725  GLUCAP 121* 118* 148*    Assessment/Plan: S/P Procedure(s) (LRB): Chest Closure S?P CABG WITH APPLICATION OF PREVENA  INCISIONAL WOUND VAC (N/A) Mobilize Diuresis dc cardioversion from atrial flutter to sinus tachy this am at bedside   LOS: 8 days    Christian [name] 08/17/2019

## 2019-08-18 ENCOUNTER — Inpatient Hospital Stay (HOSPITAL_COMMUNITY): Payer: Medicare Other

## 2019-08-18 ENCOUNTER — Encounter (HOSPITAL_COMMUNITY)
Admission: EM | Disposition: A | Discharge status: Skilled Nursing Facility | Payer: Self-pay | Source: Home / Self Care | Attending: Cardiothoracic Surgery

## 2019-08-18 DIAGNOSIS — N179 Acute kidney failure, unspecified: Secondary | ICD-10-CM | POA: Diagnosis not present

## 2019-08-18 DIAGNOSIS — I214 Non-ST elevation (NSTEMI) myocardial infarction: Secondary | ICD-10-CM | POA: Diagnosis not present

## 2019-08-18 DIAGNOSIS — R570 Cardiogenic shock: Secondary | ICD-10-CM | POA: Diagnosis not present

## 2019-08-18 HISTORY — PX: EXPLORATION POST OPERATIVE OPEN HEART: SHX5061

## 2019-08-18 LAB — POCT I-STAT 7, (LYTES, BLD GAS, ICA,H+H)
Acid-base deficit: 3 mmol/L — ABNORMAL HIGH (ref 0.0–2.0)
Acid-base deficit: 5 mmol/L — ABNORMAL HIGH (ref 0.0–2.0)
Acid-base deficit: 6 mmol/L — ABNORMAL HIGH (ref 0.0–2.0)
Acid-base deficit: 8 mmol/L — ABNORMAL HIGH (ref 0.0–2.0)
Acid-base deficit: 8 mmol/L — ABNORMAL HIGH (ref 0.0–2.0)
Acid-base deficit: 9 mmol/L — ABNORMAL HIGH (ref 0.0–2.0)
Bicarbonate: 16.6 mmol/L — ABNORMAL LOW (ref 20.0–28.0)
Bicarbonate: 17.2 mmol/L — ABNORMAL LOW (ref 20.0–28.0)
Bicarbonate: 18.8 mmol/L — ABNORMAL LOW (ref 20.0–28.0)
Bicarbonate: 18.9 mmol/L — ABNORMAL LOW (ref 20.0–28.0)
Bicarbonate: 19.7 mmol/L — ABNORMAL LOW (ref 20.0–28.0)
Bicarbonate: 22.1 mmol/L (ref 20.0–28.0)
Calcium, Ion: 0.89 mmol/L — CL (ref 1.15–1.40)
Calcium, Ion: 0.92 mmol/L — ABNORMAL LOW (ref 1.15–1.40)
Calcium, Ion: 0.93 mmol/L — ABNORMAL LOW (ref 1.15–1.40)
Calcium, Ion: 0.94 mmol/L — ABNORMAL LOW (ref 1.15–1.40)
Calcium, Ion: 0.96 mmol/L — ABNORMAL LOW (ref 1.15–1.40)
Calcium, Ion: 1.13 mmol/L — ABNORMAL LOW (ref 1.15–1.40)
HCT: 23 % — ABNORMAL LOW (ref 39.0–52.0)
HCT: 23 % — ABNORMAL LOW (ref 39.0–52.0)
HCT: 28 % — ABNORMAL LOW (ref 39.0–52.0)
HCT: 30 % — ABNORMAL LOW (ref 39.0–52.0)
HCT: 31 % — ABNORMAL LOW (ref 39.0–52.0)
HCT: 31 % — ABNORMAL LOW (ref 39.0–52.0)
Hemoglobin: 10.2 g/dL — ABNORMAL LOW (ref 13.0–17.0)
Hemoglobin: 10.5 g/dL — ABNORMAL LOW (ref 13.0–17.0)
Hemoglobin: 10.5 g/dL — ABNORMAL LOW (ref 13.0–17.0)
Hemoglobin: 7.8 g/dL — ABNORMAL LOW (ref 13.0–17.0)
Hemoglobin: 7.8 g/dL — ABNORMAL LOW (ref 13.0–17.0)
Hemoglobin: 9.5 g/dL — ABNORMAL LOW (ref 13.0–17.0)
O2 Saturation: 100 %
O2 Saturation: 100 %
O2 Saturation: 100 %
O2 Saturation: 97 %
O2 Saturation: 99 %
O2 Saturation: 99 %
Patient temperature: 35.7
Patient temperature: 35.8
Patient temperature: 35.8
Patient temperature: 36.2
Patient temperature: 36.5
Patient temperature: 37.4
Potassium: 2.6 mmol/L — CL (ref 3.5–5.1)
Potassium: 2.7 mmol/L — CL (ref 3.5–5.1)
Potassium: 2.7 mmol/L — CL (ref 3.5–5.1)
Potassium: 3 mmol/L — ABNORMAL LOW (ref 3.5–5.1)
Potassium: 3.4 mmol/L — ABNORMAL LOW (ref 3.5–5.1)
Potassium: 3.7 mmol/L (ref 3.5–5.1)
Sodium: 138 mmol/L (ref 135–145)
Sodium: 144 mmol/L (ref 135–145)
Sodium: 144 mmol/L (ref 135–145)
Sodium: 144 mmol/L (ref 135–145)
Sodium: 146 mmol/L — ABNORMAL HIGH (ref 135–145)
Sodium: 146 mmol/L — ABNORMAL HIGH (ref 135–145)
TCO2: 18 mmol/L — ABNORMAL LOW (ref 22–32)
TCO2: 18 mmol/L — ABNORMAL LOW (ref 22–32)
TCO2: 20 mmol/L — ABNORMAL LOW (ref 22–32)
TCO2: 20 mmol/L — ABNORMAL LOW (ref 22–32)
TCO2: 21 mmol/L — ABNORMAL LOW (ref 22–32)
TCO2: 24 mmol/L (ref 22–32)
pCO2 arterial: 28.3 mmHg — ABNORMAL LOW (ref 32.0–48.0)
pCO2 arterial: 28.4 mmHg — ABNORMAL LOW (ref 32.0–48.0)
pCO2 arterial: 30.1 mmHg — ABNORMAL LOW (ref 32.0–48.0)
pCO2 arterial: 35.6 mmHg (ref 32.0–48.0)
pCO2 arterial: 45.5 mmHg (ref 32.0–48.0)
pCO2 arterial: 60.3 mmHg — ABNORMAL HIGH (ref 32.0–48.0)
pH, Arterial: 7.169 — CL (ref 7.350–7.450)
pH, Arterial: 7.221 — ABNORMAL LOW (ref 7.350–7.450)
pH, Arterial: 7.287 — ABNORMAL LOW (ref 7.350–7.450)
pH, Arterial: 7.344 — ABNORMAL LOW (ref 7.350–7.450)
pH, Arterial: 7.426 (ref 7.350–7.450)
pH, Arterial: 7.451 — ABNORMAL HIGH (ref 7.350–7.450)
pO2, Arterial: 139 mmHg — ABNORMAL HIGH (ref 83.0–108.0)
pO2, Arterial: 149 mmHg — ABNORMAL HIGH (ref 83.0–108.0)
pO2, Arterial: 249 mmHg — ABNORMAL HIGH (ref 83.0–108.0)
pO2, Arterial: 390 mmHg — ABNORMAL HIGH (ref 83.0–108.0)
pO2, Arterial: 397 mmHg — ABNORMAL HIGH (ref 83.0–108.0)
pO2, Arterial: 82 mmHg — ABNORMAL LOW (ref 83.0–108.0)

## 2019-08-18 LAB — RENAL FUNCTION PANEL
Albumin: 2.2 g/dL — ABNORMAL LOW (ref 3.5–5.0)
Anion gap: 12 (ref 5–15)
BUN: 21 mg/dL (ref 8–23)
CO2: 18 mmol/L — ABNORMAL LOW (ref 22–32)
Calcium: 7.9 mg/dL — ABNORMAL LOW (ref 8.9–10.3)
Chloride: 108 mmol/L (ref 98–111)
Creatinine, Ser: 2.27 mg/dL — ABNORMAL HIGH (ref 0.61–1.24)
GFR calc Af Amer: 32 mL/min — ABNORMAL LOW (ref >60)
GFR calc non Af Amer: 28 mL/min — ABNORMAL LOW (ref >60)
Glucose, Bld: 131 mg/dL — ABNORMAL HIGH (ref 70–99)
Phosphorus: 3.3 mg/dL (ref 2.5–4.6)
Potassium: 3.8 mmol/L (ref 3.5–5.1)
Sodium: 138 mmol/L (ref 135–145)

## 2019-08-18 LAB — COMPREHENSIVE METABOLIC PANEL
ALT: 485 U/L — ABNORMAL HIGH (ref 0–44)
AST: 568 U/L — ABNORMAL HIGH (ref 15–41)
Albumin: 2.1 g/dL — ABNORMAL LOW (ref 3.5–5.0)
Alkaline Phosphatase: 54 U/L (ref 38–126)
Anion gap: 28 — ABNORMAL HIGH (ref 5–15)
BUN: 23 mg/dL (ref 8–23)
CO2: 17 mmol/L — ABNORMAL LOW (ref 22–32)
Calcium: 6.9 mg/dL — ABNORMAL LOW (ref 8.9–10.3)
Chloride: 103 mmol/L (ref 98–111)
Creatinine, Ser: 2.91 mg/dL — ABNORMAL HIGH (ref 0.61–1.24)
GFR calc Af Amer: 24 mL/min — ABNORMAL LOW (ref >60)
GFR calc non Af Amer: 21 mL/min — ABNORMAL LOW (ref >60)
Glucose, Bld: 231 mg/dL — ABNORMAL HIGH (ref 70–99)
Potassium: 2.9 mmol/L — ABNORMAL LOW (ref 3.5–5.1)
Sodium: 148 mmol/L — ABNORMAL HIGH (ref 135–145)
Total Bilirubin: 0.9 mg/dL (ref 0.3–1.2)
Total Protein: 4.1 g/dL — ABNORMAL LOW (ref 6.5–8.1)

## 2019-08-18 LAB — COOXEMETRY PANEL
Carboxyhemoglobin: 0.5 % (ref 0.5–1.5)
Carboxyhemoglobin: 0.9 % (ref 0.5–1.5)
Methemoglobin: 0.8 % (ref 0.0–1.5)
Methemoglobin: 1.2 % (ref 0.0–1.5)
O2 Saturation: 76.8 %
O2 Saturation: 88.3 %
Total hemoglobin: 8.5 g/dL — ABNORMAL LOW (ref 12.0–16.0)
Total hemoglobin: 7.5 g/dL — ABNORMAL LOW (ref 12.0–16.0)

## 2019-08-18 LAB — GLUCOSE, CAPILLARY
Glucose-Capillary: 107 mg/dL — ABNORMAL HIGH (ref 70–99)
Glucose-Capillary: 122 mg/dL — ABNORMAL HIGH (ref 70–99)
Glucose-Capillary: 132 mg/dL — ABNORMAL HIGH (ref 70–99)
Glucose-Capillary: 137 mg/dL — ABNORMAL HIGH (ref 70–99)
Glucose-Capillary: 143 mg/dL — ABNORMAL HIGH (ref 70–99)
Glucose-Capillary: 144 mg/dL — ABNORMAL HIGH (ref 70–99)
Glucose-Capillary: 146 mg/dL — ABNORMAL HIGH (ref 70–99)
Glucose-Capillary: 149 mg/dL — ABNORMAL HIGH (ref 70–99)
Glucose-Capillary: 151 mg/dL — ABNORMAL HIGH (ref 70–99)
Glucose-Capillary: 159 mg/dL — ABNORMAL HIGH (ref 70–99)
Glucose-Capillary: 164 mg/dL — ABNORMAL HIGH (ref 70–99)
Glucose-Capillary: 202 mg/dL — ABNORMAL HIGH (ref 70–99)
Glucose-Capillary: 216 mg/dL — ABNORMAL HIGH (ref 70–99)
Glucose-Capillary: 231 mg/dL — ABNORMAL HIGH (ref 70–99)
Glucose-Capillary: 237 mg/dL — ABNORMAL HIGH (ref 70–99)
Glucose-Capillary: 76 mg/dL (ref 70–99)

## 2019-08-18 LAB — CBC
HCT: 27.2 % — ABNORMAL LOW (ref 39.0–52.0)
HCT: 30.1 % — ABNORMAL LOW (ref 39.0–52.0)
Hemoglobin: 9 g/dL — ABNORMAL LOW (ref 13.0–17.0)
Hemoglobin: 10.1 g/dL — ABNORMAL LOW (ref 13.0–17.0)
MCH: 30.9 pg (ref 26.0–34.0)
MCH: 30.5 pg (ref 26.0–34.0)
MCHC: 33.1 g/dL (ref 30.0–36.0)
MCHC: 33.6 g/dL (ref 30.0–36.0)
MCV: 93.5 fL (ref 80.0–100.0)
MCV: 90.9 fL (ref 80.0–100.0)
Platelets: 129 10*3/uL — ABNORMAL LOW (ref 150–400)
Platelets: 177 10*3/uL (ref 150–400)
RBC: 2.91 MIL/uL — ABNORMAL LOW (ref 4.22–5.81)
RBC: 3.31 MIL/uL — ABNORMAL LOW (ref 4.22–5.81)
RDW: 17.5 % — ABNORMAL HIGH (ref 11.5–15.5)
RDW: 17.6 % — ABNORMAL HIGH (ref 11.5–15.5)
WBC: 14.7 10*3/uL — ABNORMAL HIGH (ref 4.0–10.5)
WBC: 21.3 10*3/uL — ABNORMAL HIGH (ref 4.0–10.5)
nRBC: 0 % (ref 0.0–0.2)
nRBC: 0.5 % — ABNORMAL HIGH (ref 0.0–0.2)

## 2019-08-18 LAB — BASIC METABOLIC PANEL
Anion gap: 12 (ref 5–15)
BUN: 17 mg/dL (ref 8–23)
CO2: 22 mmol/L (ref 22–32)
Calcium: 7.4 mg/dL — ABNORMAL LOW (ref 8.9–10.3)
Chloride: 102 mmol/L (ref 98–111)
Creatinine, Ser: 2.03 mg/dL — ABNORMAL HIGH (ref 0.61–1.24)
GFR calc Af Amer: 37 mL/min — ABNORMAL LOW (ref >60)
GFR calc non Af Amer: 32 mL/min — ABNORMAL LOW (ref >60)
Glucose, Bld: 228 mg/dL — ABNORMAL HIGH (ref 70–99)
Potassium: 3.3 mmol/L — ABNORMAL LOW (ref 3.5–5.1)
Sodium: 136 mmol/L (ref 135–145)

## 2019-08-18 LAB — APTT: aPTT: 45 seconds — ABNORMAL HIGH (ref 24–36)

## 2019-08-18 LAB — PREPARE RBC (CROSSMATCH)

## 2019-08-18 LAB — HEPARIN LEVEL (UNFRACTIONATED): Heparin Unfractionated: <0.1 IU/mL — ABNORMAL LOW (ref 0.30–0.70)

## 2019-08-18 LAB — PROTIME-INR
INR: 1.4 — ABNORMAL HIGH (ref 0.8–1.2)
Prothrombin Time: 17.2 seconds — ABNORMAL HIGH (ref 11.4–15.2)

## 2019-08-18 LAB — TRIGLYCERIDES: Triglycerides: 115 mg/dL (ref <150)

## 2019-08-18 SURGERY — EXPLORATION POST OPERATIVE OPEN HEART
Anesthesia: General | Site: Chest

## 2019-08-18 MED ORDER — MIDAZOLAM 50MG/50ML (1MG/ML) PREMIX INFUSION
0.0000 mg/h | INTRAVENOUS | Status: DC
  Administered 2019-08-19: 4 mg/h via INTRAVENOUS
  Filled 2019-08-18 (×2): qty 50

## 2019-08-18 MED ORDER — POTASSIUM CHLORIDE 10 MEQ/50ML IV SOLN
10.0000 meq | INTRAVENOUS | Status: AC
  Administered 2019-08-18 – 2019-08-19 (×4): 10 meq via INTRAVENOUS

## 2019-08-18 MED ORDER — MIDAZOLAM BOLUS VIA INFUSION
1.0000 mg | INTRAVENOUS | Status: DC | PRN
  Filled 2019-08-18: qty 2

## 2019-08-18 MED ORDER — HYDRALAZINE HCL 20 MG/ML IJ SOLN
10.0000 mg | INTRAMUSCULAR | Status: DC | PRN
  Administered 2019-08-26: 10 mg via INTRAVENOUS
  Filled 2019-08-18: qty 1

## 2019-08-18 MED ORDER — MAGNESIUM SULFATE 2 GM/50ML IV SOLN
2.0000 g | Freq: Once | INTRAVENOUS | Status: DC
  Filled 2019-08-18: qty 50

## 2019-08-18 MED ORDER — EPINEPHRINE HCL 5 MG/250ML IV SOLN IN NS
INTRAVENOUS | Status: AC
  Filled 2019-08-18: qty 250

## 2019-08-18 MED ORDER — ALBUMIN HUMAN 5 % IV SOLN: 12.5000 g | Freq: Once | INTRAVENOUS | Status: DC

## 2019-08-18 MED ORDER — DILTIAZEM HCL-DEXTROSE 125-5 MG/125ML-% IV SOLN (PREMIX)
5.0000 mg/h | INTRAVENOUS | Status: DC
  Administered 2019-08-18: 5 mg/h via INTRAVENOUS
  Filled 2019-08-18: qty 125

## 2019-08-18 MED ORDER — MIDAZOLAM HCL 2 MG/2ML IJ SOLN
INTRAMUSCULAR | Status: AC
  Administered 2019-08-18: 19:00:00 2 mg via INTRAVENOUS
  Filled 2019-08-18: qty 4

## 2019-08-18 MED ORDER — EPINEPHRINE 1 MG/10ML IJ SOSY
PREFILLED_SYRINGE | INTRAMUSCULAR | Status: AC
  Administered 2019-08-18: 1 mg
  Filled 2019-08-18: qty 50

## 2019-08-18 MED ORDER — METHYLPREDNISOLONE SODIUM SUCC 40 MG IJ SOLR
40.0000 mg | Freq: Four times a day (QID) | INTRAMUSCULAR | Status: AC
  Administered 2019-08-18 – 2019-08-20 (×6): 40 mg via INTRAVENOUS
  Filled 2019-08-18 (×6): qty 1

## 2019-08-18 MED ORDER — HYDRALAZINE HCL 25 MG PO TABS: 25.0000 mg | ORAL_TABLET | Freq: Three times a day (TID) | ORAL | Status: DC

## 2019-08-18 MED ORDER — DOCUSATE SODIUM 50 MG/5ML PO LIQD
100.0000 mg | Freq: Two times a day (BID) | ORAL | Status: DC
  Administered 2019-08-18: 100 mg via ORAL
  Filled 2019-08-18: qty 10

## 2019-08-18 MED ORDER — ASPIRIN EC 81 MG PO TBEC
81.0000 mg | DELAYED_RELEASE_TABLET | Freq: Every day | ORAL | Status: DC
  Filled 2019-08-18: qty 1

## 2019-08-18 MED ORDER — VANCOMYCIN HCL 2000 MG/400ML IV SOLN
2000.0000 mg | Freq: Once | INTRAVENOUS | Status: AC
  Administered 2019-08-19: 2000 mg via INTRAVENOUS
  Filled 2019-08-18: qty 400

## 2019-08-18 MED ORDER — SODIUM BICARBONATE 8.4 % IV SOLN
INTRAVENOUS | Status: AC
  Filled 2019-08-18: qty 50

## 2019-08-18 MED ORDER — CHLORHEXIDINE GLUCONATE 0.12 % MT SOLN
15.0000 mL | Freq: Two times a day (BID) | OROMUCOSAL | Status: DC
  Administered 2019-08-19 – 2019-08-20 (×2): 15 mL via OROMUCOSAL
  Filled 2019-08-18 (×4): qty 15

## 2019-08-18 MED ORDER — EPINEPHRINE HCL 5 MG/250ML IV SOLN IN NS
0.5000 ug/min | INTRAVENOUS | Status: DC
  Administered 2019-08-19: 5 ug/min via INTRAVENOUS
  Administered 2019-08-19: 6 ug/min via INTRAVENOUS
  Filled 2019-08-18: qty 250

## 2019-08-18 MED ORDER — FENTANYL 2500MCG IN NS 250ML (10MCG/ML) PREMIX INFUSION
25.0000 ug/h | INTRAVENOUS | Status: DC
  Administered 2019-08-19 – 2019-08-20 (×2): 200 ug/h via INTRAVENOUS
  Administered 2019-08-20: 210 ug/h via INTRAVENOUS
  Administered 2019-08-20 – 2019-08-22 (×2): 200 ug/h via INTRAVENOUS
  Administered 2019-08-23 – 2019-08-24 (×3): 175 ug/h via INTRAVENOUS
  Administered 2019-08-24: 150 ug/h via INTRAVENOUS
  Administered 2019-08-25: 125 ug/h via INTRAVENOUS
  Administered 2019-08-26 – 2019-08-27 (×2): 150 ug/h via INTRAVENOUS
  Filled 2019-08-18 (×16): qty 250

## 2019-08-18 MED ORDER — CLOPIDOGREL BISULFATE 75 MG PO TABS
75.0000 mg | ORAL_TABLET | Freq: Every day | ORAL | Status: DC
  Administered 2019-08-19: 75 mg via ORAL
  Filled 2019-08-18: qty 1

## 2019-08-18 MED ORDER — SODIUM CHLORIDE 0.9% IV SOLUTION: Freq: Once | INTRAVENOUS | Status: DC

## 2019-08-18 MED ORDER — ISOSORBIDE DINITRATE 10 MG PO TABS
20.0000 mg | ORAL_TABLET | Freq: Three times a day (TID) | ORAL | Status: DC
  Administered 2019-08-18 – 2019-08-28 (×27): 20 mg
  Filled 2019-08-18 (×28): qty 2

## 2019-08-18 MED ORDER — LEVALBUTEROL HCL 0.63 MG/3ML IN NEBU
0.6300 mg | INHALATION_SOLUTION | Freq: Four times a day (QID) | RESPIRATORY_TRACT | Status: AC
  Administered 2019-08-18 – 2019-08-20 (×8): 0.63 mg via RESPIRATORY_TRACT
  Filled 2019-08-18 (×8): qty 3

## 2019-08-18 MED ORDER — SODIUM CHLORIDE 0.9 % IV SOLN
2.0000 g | INTRAVENOUS | Status: DC
  Administered 2019-08-19 (×2): 2 g via INTRAVENOUS
  Filled 2019-08-18 (×2): qty 2

## 2019-08-18 MED ORDER — SODIUM BICARBONATE-DEXTROSE 150-5 MEQ/L-% IV SOLN
150.0000 meq | INTRAVENOUS | Status: DC
  Administered 2019-08-19: 150 meq via INTRAVENOUS
  Filled 2019-08-18 (×2): qty 1000

## 2019-08-18 MED ORDER — AMIODARONE LOAD VIA INFUSION
150.0000 mg | Freq: Once | INTRAVENOUS | Status: AC
  Administered 2019-08-18: 150 mg via INTRAVENOUS
  Filled 2019-08-18: qty 83.34

## 2019-08-18 MED ORDER — NOREPINEPHRINE 16 MG/250ML-% IV SOLN
0.0000 ug/min | INTRAVENOUS | Status: DC
  Administered 2019-08-19: 1 ug/min via INTRAVENOUS
  Administered 2019-08-19 – 2019-08-22 (×2): 2 ug/min via INTRAVENOUS
  Filled 2019-08-18 (×2): qty 250

## 2019-08-18 MED ORDER — FENTANYL CITRATE (PF) 100 MCG/2ML IJ SOLN: 25.0000 ug | Freq: Once | INTRAMUSCULAR | Status: DC

## 2019-08-18 MED ORDER — PROPOFOL 1000 MG/100ML IV EMUL
INTRAVENOUS | Status: AC
  Filled 2019-08-18: qty 100

## 2019-08-18 MED ORDER — ALBUMIN HUMAN 5 % IV SOLN
INTRAVENOUS | Status: AC
  Filled 2019-08-18: qty 250

## 2019-08-18 MED ORDER — POTASSIUM CHLORIDE 20 MEQ/15ML (10%) PO SOLN: 40.0000 meq | Freq: Once | ORAL | Status: DC

## 2019-08-18 MED ORDER — FENTANYL BOLUS VIA INFUSION
25.0000 ug | INTRAVENOUS | Status: DC | PRN
  Administered 2019-08-19 – 2019-08-26 (×13): 25 ug via INTRAVENOUS
  Filled 2019-08-18: qty 25

## 2019-08-18 MED ORDER — POTASSIUM CHLORIDE 10 MEQ/50ML IV SOLN
INTRAVENOUS | Status: AC
  Administered 2019-08-18: 10 meq via INTRAVENOUS
  Filled 2019-08-18: qty 50

## 2019-08-18 MED ORDER — POLYETHYLENE GLYCOL 3350 17 G PO PACK
17.0000 g | PACK | Freq: Every day | ORAL | Status: DC
  Administered 2019-08-19: 17 g via ORAL
  Filled 2019-08-18: qty 1

## 2019-08-18 MED ORDER — PROPOFOL 1000 MG/100ML IV EMUL
5.0000 ug/kg/min | INTRAVENOUS | Status: DC
  Administered 2019-08-18: 5 ug/kg/min via INTRAVENOUS
  Filled 2019-08-18: qty 100

## 2019-08-18 MED ORDER — FENTANYL CITRATE (PF) 100 MCG/2ML IJ SOLN
INTRAMUSCULAR | Status: AC
  Filled 2019-08-18: qty 2

## 2019-08-18 MED ORDER — MIDAZOLAM HCL 2 MG/2ML IJ SOLN
INTRAMUSCULAR | Status: AC
  Administered 2019-08-18: 2 mg via INTRAVENOUS
  Filled 2019-08-18: qty 2

## 2019-08-18 MED ORDER — POTASSIUM CHLORIDE 10 MEQ/50ML IV SOLN
10.0000 meq | INTRAVENOUS | Status: AC
  Administered 2019-08-18 (×2): 10 meq via INTRAVENOUS
  Filled 2019-08-18: qty 50

## 2019-08-18 MED ORDER — ORAL CARE MOUTH RINSE: 15.0000 mL | Freq: Two times a day (BID) | OROMUCOSAL | Status: DC

## 2019-08-18 SURGICAL SUPPLY — 65 items
BAG DECANTER FOR FLEXI CONT (MISCELLANEOUS) ×1 IMPLANT
BNDG ELASTIC 4X5.8 VLCR STR LF (GAUZE/BANDAGES/DRESSINGS) ×1 IMPLANT
BNDG ELASTIC 6X5.8 VLCR STR LF (GAUZE/BANDAGES/DRESSINGS) ×1 IMPLANT
BNDG ESMARK 4X9 LF (GAUZE/BANDAGES/DRESSINGS) ×1 IMPLANT
CANISTER SUCT 3000ML PPV (MISCELLANEOUS) ×2 IMPLANT
CATH CPB KIT HENDRICKSON (MISCELLANEOUS) ×1 IMPLANT
CATH ROBINSON RED A/P 18FR (CATHETERS) ×2 IMPLANT
DERMABOND ADVANCED (GAUZE/BANDAGES/DRESSINGS)
DERMABOND ADVANCED .7 DNX12 (GAUZE/BANDAGES/DRESSINGS) ×1 IMPLANT
DRAIN CHANNEL 28F RND 3/8 FF (WOUND CARE) ×3 IMPLANT
DRAPE CARDIOVASCULAR INCISE (DRAPES)
DRAPE HALF SHEET 40X57 (DRAPES) ×1 IMPLANT
DRAPE INCISE IOBAN 85X60 (DRAPES) ×1 IMPLANT
DRAPE SLUSH/WARMER DISC (DRAPES) ×1 IMPLANT
DRAPE SRG 135X102X78XABS (DRAPES) ×1 IMPLANT
DRSG AQUACEL AG ADV 3.5X14 (GAUZE/BANDAGES/DRESSINGS) ×1 IMPLANT
ELECT CAUTERY BLADE 6.4 (BLADE) ×1 IMPLANT
ELECT REM PT RETURN 9FT ADLT (ELECTROSURGICAL)
ELECTRODE REM PT RTRN 9FT ADLT (ELECTROSURGICAL) ×2 IMPLANT
FELT TEFLON 1X6 (MISCELLANEOUS) ×2 IMPLANT
GAUZE SPONGE 4X4 12PLY STRL (GAUZE/BANDAGES/DRESSINGS) ×2 IMPLANT
GLOVE BIO SURGEON STRL SZ 6.5 (GLOVE) ×2 IMPLANT
GLOVE NEODERM STRL 7.5  LF PF (GLOVE) ×3
GLOVE NEODERM STRL 7.5 LF PF (GLOVE) ×3 IMPLANT
GLOVE SURG NEODERM 7.5  LF PF (GLOVE) ×3
GOWN STRL REUS W/ TWL LRG LVL3 (GOWN DISPOSABLE) ×4 IMPLANT
GOWN STRL REUS W/TWL LRG LVL3 (GOWN DISPOSABLE) ×4
HEMOSTAT POWDER SURGIFOAM 1G (HEMOSTASIS) ×3 IMPLANT
HEMOSTAT SURGICEL 2X14 (HEMOSTASIS) ×2 IMPLANT
KIT BASIN OR (CUSTOM PROCEDURE TRAY) ×1 IMPLANT
KIT SUCTION CATH 14FR (SUCTIONS) ×1 IMPLANT
KIT TURNOVER KIT B (KITS) ×1 IMPLANT
NS IRRIG 1000ML POUR BTL (IV SOLUTION) ×7 IMPLANT
PACK E OPEN HEART (SUTURE) ×1 IMPLANT
PACK OPEN HEART (CUSTOM PROCEDURE TRAY) ×1 IMPLANT
PAD ARMBOARD 7.5X6 YLW CONV (MISCELLANEOUS) ×2 IMPLANT
PAD ELECT DEFIB RADIOL ZOLL (MISCELLANEOUS) ×1 IMPLANT
POSITIONER HEAD DONUT 9IN (MISCELLANEOUS) ×1 IMPLANT
SPONGE LAP 18X18 RF (DISPOSABLE) ×2 IMPLANT
SUT BONE WAX W31G (SUTURE) ×1 IMPLANT
SUT MNCRL AB 3-0 PS2 18 (SUTURE) ×2 IMPLANT
SUT PDS AB 1 CTX 36 (SUTURE) ×2 IMPLANT
SUT PROLENE 1 CTX 30  8455H (SUTURE) ×4
SUT PROLENE 1 CTX 30 8455H (SUTURE) IMPLANT
SUT PROLENE 3 0 SH DA (SUTURE) ×1 IMPLANT
SUT PROLENE 5 0 C 1 36 (SUTURE) IMPLANT
SUT PROLENE 6 0 C 1 30 (SUTURE) ×3 IMPLANT
SUT PROLENE 8 0 BV175 6 (SUTURE) IMPLANT
SUT PROLENE BLUE 7 0 (SUTURE) ×1 IMPLANT
SUT SILK 2 0 SH CR/8 (SUTURE) IMPLANT
SUT SILK 3 0 SH CR/8 (SUTURE) IMPLANT
SUT STEEL 6MS V (SUTURE) ×1 IMPLANT
SUT STEEL SZ 6 DBL 3X14 BALL (SUTURE) ×1 IMPLANT
SUT VIC AB 2-0 CTX 27 (SUTURE) IMPLANT
SUT VIC AB 3-0 X1 27 (SUTURE) IMPLANT
SYR 30ML LL (SYRINGE) ×1 IMPLANT
SYR BULB IRRIGATION 50ML (SYRINGE) ×1 IMPLANT
SYSTEM SAHARA CHEST DRAIN ATS (WOUND CARE) ×2 IMPLANT
TOWEL GREEN STERILE (TOWEL DISPOSABLE) ×2 IMPLANT
TOWEL GREEN STERILE FF (TOWEL DISPOSABLE) ×1 IMPLANT
TRAY FOLEY SLVR 16FR TEMP STAT (SET/KITS/TRAYS/PACK) ×1 IMPLANT
UNDERPAD 30X30 (UNDERPADS AND DIAPERS) ×1 IMPLANT
WATER STERILE IRR 1000ML POUR (IV SOLUTION) ×2 IMPLANT
WATER STERILE IRR 1000ML UROMA (IV SOLUTION) IMPLANT
YANKAUER SUCT BULB TIP NO VENT (SUCTIONS) ×1 IMPLANT

## 2019-08-18 NOTE — Progress Notes (Signed)
NIF -28, VC .970 L prior to extubation

## 2019-08-18 NOTE — Progress Notes (Addendum)
   ADVANCED HF/SHOCK TEAM NOTE  Asked by Dr. Orvan Seen to perform bedside echo as patient was clinically deteriorating.  On my arrival patient with increased WOB with AF with RVR but communicative.   Patient then went unresponsive and had bradycardic arrest inf ron of Dr. Orvan Seen and myself.   Coe Blue activated. CPR began emergently and given 1 amp epi with no response.   Patent received multiple doses of epi and bicarb without response.   Unable to oxygenate with bag-mask. CCM and anesthesia not available emergently at bedsde, so with RTs assistance, I intubated the patient emergently using the Glide scope with good color change and equal breath sounds.   Dr. Orvan Seen then emergently opened the chest and performed intracardiac massage. Patient developed VF and then received intracardiac defibrillation. Then proceeded to remove clots from pericardium.   We then established a perfusing rhythm. Vent adjusted and started on epi and bicarb drips. 2u RBCs transfused.   Dr. Orvan Seen then placed sternal space and placed a cover over the closed chest.   I continued to titrate drips and f/u labs pending.   Total CCT 2 hours not including intubation.   Glori Bickers, MD  9:19 PM

## 2019-08-18 NOTE — Progress Notes (Signed)
Dr Orvan Seen paged and at bedside along with Dr Haroldine Laws.  Pt unresponsive, PEA noted.  Code blue called. CPR immediately initiated.See code blue record.

## 2019-08-18 NOTE — Procedures (Signed)
Christian [name] is a 74 y.o. male patient. S/p CABG with declining hemodynamics this afternoon. While preparing to perform urgent chest wall echo, the patient became unresponsive and this was witnessed personally. A Code Blue was called immediately.  1. NSTEMI (non-ST elevated myocardial infarction) ([place])   2. Other acute pulmonary embolism without acute cor pulmonale ([place])   3. Coronary artery disease involving native heart without angina pectoris, unspecified vessel or lesion type   4. Abdominal pain   5. S/P CABG x 4   6. Non-ST elevation (NSTEMI) myocardial infarction ([place])   7. History of open heart surgery   8. Encounter for nasogastric (NG) tube placement   9. Hx of CABG   10. Encounter for imaging study to confirm orogastric (OG) tube placement    Past Medical History:  Diagnosis Date  . BPH (benign prostatic hypertrophy)   . Coronary artery disease   . Diabetes mellitus without complication ([place])   . Dry eyes left  . Hiatal hernia   . 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  LIMA TO LAD, RIMA TO PDA, RADIAL ARTERY TO CIRC AND SEQUENTIALLY TO OM1. 08/15/2019  . Hyperlipidemia   . Hypertension   . Incomplete bladder emptying   . Nocturia   . 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 SOB (shortness of breath) on exertion    Blood pressure (!) 84/24, pulse (!) 102, temperature (!) 96.3 F (35.7 C), resp. rate (!) 28, height 5\' 9"  (1.753 m), weight 105.6 kg, SpO2 100 %.  Procedures  Cardiac arrest procedures were instituted including endotracheal intubation and chest compressions as well as delivery of code drugs. While this occurred, we prepared to open the chest urgently. This was done under sterile technique. Manual cardiac massage was performed personally. A dense clot was evacuated from behind the RV. Hemodynamics improved significantly.  Copious irrigation of the mediastinum was performed. A spacer was placed between the sternal halves. An Esmark dressing was sewn to the skin edges and covered with ioban dressing. The family was informed shortly thereafter.   [name] 08/18/2019

## 2019-08-18 NOTE — Procedures (Signed)
Extubation Procedure Note  Patient Details:   Name: Christian [name] DOB: Sep 22, 1945 MRN: [id number]   Airway Documentation:    Vent end date: 08/18/19 Vent end time: 0940   Evaluation  O2 sats: stable throughout Complications: No apparent complications Patient did tolerate procedure well. Bilateral Breath Sounds: Diminished, Rhonchi   Yes  Gonzella Lex 08/18/2019, 9:50 AM

## 2019-08-18 NOTE — Progress Notes (Signed)
Pharmacy Antibiotic Note  Christian [name] is a 74 y.o. male admitted on 08/09/2019 now s/p CABG. Concern for possible infection earlier today d/t increase in WBC - then coded and now open chest s/p re-opening in ICU  SCr worsening from 2.27 > 2.91  Plan: Cefepime 2 g q24h vanc 2 g x 1 - will f/u with levels likely sun or Monday based on renal fx  Height: 5\' 9"  (175.3 cm) Weight: 105.6 kg (232 lb 12.9 oz) IBW/kg (Calculated) : 70.7  Temp (24hrs), Avg:97.7 F (36.5 C), Min:96.3 F (35.7 C), Max:99.5 F (37.5 C)  Recent Labs  Lab 08/16/19 1529 08/16/19 1529 08/17/19 0353 08/17/19 1348 08/18/19 0336 08/18/19 1500 08/18/19 1532 08/18/19 2002  WBC 11.2*  --  13.7*  --  14.7*  --  21.3* 16.0*  CREATININE 1.31*   < > 1.55* 1.99* 2.03* 2.27*  --  2.91*   < > = values in this interval not displayed.    Estimated Creatinine Clearance: 27.5 mL/min (A) (by C-G formula based on SCr of 2.91 mg/dL (H)).    No Known Allergies  Barth Kirks, PharmD, BCPS, BCCCP Clinical Pharmacist [phone]  Please check AMION for all [place] numbers  08/18/2019 9:17 PM

## 2019-08-18 NOTE — Progress Notes (Addendum)
Advanced Heart Failure Rounding Note  PCP-Cardiologist: No primary care provider on file.   Subjective:   PE 08/09/19  CABG x4 on 08/15/19  Yesterday started back on lasix drip 8 mg per hour. Weight trending up 228>232 (Pre op weight 213)   Remains on milrinone 0.25 mcg + amio 60 mg per hour. CO-OX 77%.   PAP 32/19  CVP 12 CO-OX 77%.   Awake on the vent. BP trends up as sedation comes off.  SBP . 170-180  Objective:   Weight Range: 105.6 kg Body mass index is 34.38 kg/m.   Vital Signs:   Temp:  [96.4 F (35.8 C)-100.4 F (38 C)] 96.4 F (35.8 C) (04/23 0700) Pulse Rate:  [82-110] 91 (04/23 0700) Resp:  [14-26] 16 (04/23 0700) BP: (88-163)/(56-91) 100/65 (04/23 0700) SpO2:  [94 %-100 %] 100 % (04/23 0700) Arterial Line BP: (85-181)/(52-78) 97/53 (04/23 0700) FiO2 (%):  [50 %] 50 % (04/23 0431) Weight:  [105.6 kg] 105.6 kg (04/23 0500) Last BM Date: 08/13/19  Weight change: Filed Weights   08/16/19 0436 08/17/19 0428 08/18/19 0500  Weight: 105.6 kg 103.8 kg 105.6 kg    Intake/Output:   Intake/Output Summary (Last 24 hours) at 08/18/2019 0821 Last data filed at 08/18/2019 0700 Gross per 24 hour  Intake 2815.92 ml  Output 2239 ml  Net 576.92 ml      Physical Exam   CVP 12-13  General:  Intubated/awake  HEENT: ETT Neck: Supple. JVP 12-13  . Carotids 2+ bilat; no bruits. No lymphadenopathy or thyromegaly appreciated. Cor: PMI nondisplaced. Regular rate & rhythm. No rubs, gallops or murmurs. Prevena VAC Lungs: Deceased in the bases.  Abdomen: Soft, nontender, nondistended. No hepatosplenomegaly. No bruits or masses. Good bowel sounds. Extremities: No cyanosis, clubbing, rash, edema. LUE 2+ edema with hydrocolloid in place  Neuro: Intubated awake. MAE x4.   Telemetry   NSR 90s personally revewed.   EKG    N/a   Labs    CBC Recent Labs    08/17/19 0353 08/17/19 0353 08/17/19 0405 08/18/19 0336  WBC 13.7*  --   --  14.7*  NEUTROABS 11.0*  --    --   --   HGB 11.4*   < > 10.9* 9.0*  HCT 34.6*   < > 32.0* 27.2*  MCV 93.8  --   --  93.5  PLT 128*  --   --  129*   < > = values in this interval not displayed.   Basic Metabolic Panel Recent Labs    08/16/19 1529 08/16/19 1841 08/17/19 0353 08/17/19 0405 08/17/19 1348 08/18/19 0336  NA 139   < > 137   < > 138 136  K 4.0   < > 4.2   < > 4.1 3.3*  CL 108   < > 106   < > 106 102  CO2 23   < > 21*   < > 21* 22  GLUCOSE 144*   < > 147*   < > 156* 228*  BUN 7*   < > 10   < > 13 17  CREATININE 1.31*   < > 1.55*   < > 1.99* 2.03*  CALCIUM 7.6*   < > 7.9*   < > 7.7* 7.4*  MG 2.4  --  2.2  --   --   --   PHOS  --   --  2.8  --   --   --    < > =  values in this interval not displayed.   Liver Function Tests Recent Labs    08/16/19 1529 08/16/19 1529 08/17/19 0353 08/17/19 1348  AST 46*  --   --  41  ALT 26  --   --  25  ALKPHOS 47  --   --  53  BILITOT 0.6  --   --  0.8  PROT 5.3*  --   --  5.2*  ALBUMIN 2.9*   < > 2.5* 2.3*   < > = values in this interval not displayed.   No results for input(s): LIPASE, AMYLASE in the last 72 hours. Cardiac Enzymes No results for input(s): CKTOTAL, CKMB, CKMBINDEX, TROPONINI in the last 72 hours.  BNP: BNP (last 3 results) Recent Labs    08/09/19 1240  BNP 89.1    ProBNP (last 3 results) Recent Labs    11/23/18 1120 08/09/19 0922  PROBNP 18.0 78.0     D-Dimer No results for input(s): DDIMER in the last 72 hours. Hemoglobin A1C No results for input(s): HGBA1C in the last 72 hours. Fasting Lipid Panel Recent Labs    08/15/19 1958  TRIG 90   Thyroid Function Tests No results for input(s): TSH, T4TOTAL, T3FREE, THYROIDAB in the last 72 hours.  Invalid input(s): FREET3  Other results:   Imaging     No results found.   Medications:     Scheduled Medications: . sodium chloride   Intravenous Once  . acetaminophen  1,000 mg Oral Q6H   Or  . acetaminophen (TYLENOL) oral liquid 160 mg/5 mL  1,000 mg Per  Tube Q6H  . aspirin EC  325 mg Oral Daily   Or  . aspirin  324 mg Per Tube Daily  . aspirin  300 mg Rectal Daily  . atorvastatin  40 mg Oral QPM  . bisacodyl  10 mg Oral Daily   Or  . bisacodyl  10 mg Rectal Daily  . chlorhexidine gluconate (MEDLINE KIT)  15 mL Mouth Rinse BID  . Chlorhexidine Gluconate Cloth  6 each Topical Daily  . docusate sodium  200 mg Oral Daily  . isosorbide dinitrate  5 mg Oral TID  . mouth rinse  15 mL Mouth Rinse 10 times per day  . methylPREDNISolone (SOLU-MEDROL) injection  60 mg Intravenous Q6H  . pantoprazole  40 mg Oral Daily  . sodium chloride flush  10-40 mL Intracatheter Q12H  . sodium chloride flush  3 mL Intravenous Q12H  . tamsulosin  0.4 mg Oral QPC breakfast  . trospium  20 mg Oral BID     Infusions: . sodium chloride Stopped (08/16/19 1631)  . sodium chloride    . sodium chloride    . sodium chloride 20 mL/hr at 08/18/19 0700  . amiodarone 60 mg/hr (08/18/19 0700)  . dexmedetomidine (PRECEDEX) IV infusion Stopped (08/15/19 1900)  . epinephrine Stopped (08/15/19 1405)  . furosemide (LASIX) infusion 8 mg/hr (08/18/19 0700)  . heparin 500 Units/hr (08/18/19 0700)  . insulin 8.5 mL/hr at 08/18/19 0700  . lactated ringers    . lactated ringers    . lactated ringers 20 mL/hr at 08/16/19 1600  . milrinone 0.25 mcg/kg/min (08/18/19 0700)  . nitroGLYCERIN Stopped (08/17/19 0137)  . norepinephrine (LEVOPHED) Adult infusion Stopped (08/18/19 0649)  . phenylephrine (NEO-SYNEPHRINE) Adult infusion Stopped (08/17/19 0830)  . propofol (DIPRIVAN) infusion 20 mcg/kg/min (08/18/19 0700)     PRN Medications:  sodium chloride, ALPRAZolam, cromolyn, dextrose, lactated ringers, metoprolol tartrate, midazolam, morphine injection, ondansetron (ZOFRAN)  IV, oxyCODONE, simethicone, sodium chloride flush, sodium chloride flush, traMADol     Assessment/Plan   1. CAD:  - s/p PDA angioplasty + stent in 02/2014 - Admitted for NSTEMI 4/14. Hs Trop  peaked at [place] w/ severe 3V CAD. Echo w/ reduced EF 30-35%. RV ok. - S/p CABG x 4 on 4/20 (LIMA-LAD, RIMA to PDA, Lt radial artery graft to 1st OM and distal OM branches of LCx) - Remains intubated.  - continue ASA + statin  - Radial harvest. On isordil.   2. PE: - Chest CT 4/14 w/ small RLL PE - Echo shows normal RV. No strain - Bilateral Venous Dopplers negative for DVT - Back on heparin drip.  - Anticipate switching eliquis prior to d/c.   3. Acute Systolic Heart Failure/ Ischemic Cardiomyopathy  - LVEF severely reduced at 30-35%, in the setting of NSTEM/ Multivessel CAD. RV ok. - Volume overloaded post CABG. Preop Weight 213 pounds.  - Weight trending up. CVP 12-13.  - Remains volume overloaded. Increase lasix to 10 mg per hour. - CO-OX 77%. Cut back milrinone to 0.125 mcg - Off sedation SBP > 180.  - Add hydralazine 25 mg tid. Increase isordil to 20 mg tid. With low EF + AA will check on cost for  bidil.  - Hold off on spiro and entresto.   4. Post Operative Atrial Flutter - s/p DCCV 4/22 -In NSR today.  - Continue amio drip.  - On heparin drip. Once ok with surgery will switch to eliquis.   5. T2DM - Hgb A1c 7.7 - insulin per primary team - consider future addition of an SGLT2i given concomitant systolic heart failure   6. AKI on Stage III CKD - baseline SCr 0.9, ? Related to hypotension  - Creatinine up to 2 today. Making over ~2 liters of urine.  Continue to diurese today.    Length of Stay: [place], NP  08/18/2019, 8:21 AM  Advanced Heart Failure Team Pager [phone] (M-F; 7a - 4p)  Please contact [facility] for night-coverage after hours (4p -7a ) and weekends on [email or web address]  Patient seen with NP, agree with the above note.   Patient is still volume overloaded with CVP 12-13, remains on vent.  Had atrial fibrillation overnight but back in NSR.  Co-ox excellent at 77%. Creatinine up to 2.   General: Intubated.  Neck: JVP 10-12 cm, no  thyromegaly or thyroid nodule.  Lungs: Clear to auscultation bilaterally with normal respiratory effort. CV: Nondisplaced PMI.  Heart regular S1/S2, no S3/S4, no murmur.  No peripheral edema.   Abdomen: Soft, nontender, no hepatosplenomegaly, no distention.  Skin: Intact without lesions or rashes.  Neurologic: Awake on vent.  Extremities: No clubbing or cyanosis.  HEENT: Normal.   1. CAD: NSTEMI with 3VD, now s/p CABG wiith LIMA-LAD, RIMA-PDA, seq left radial to OM1/PLOM.  Had delayed closure of chest wall on 4/21 due to volume overload.  - Continue ASA and statin.  2. Acute systolic CHF: Ischemic CMP.  Echo pre-op with EF 30-35%, normal RV size and systolic function.  He is volume overloaded, CVP is 12-13 currently with co-ox 77%.  He is still considerably above his pre-op weight.  He did not diurese well yesterday, weight up.  Creatinine higher at 2. BP is high, has been off norepinephrine and still on milrinone 0.25.   - Increase Lasix to 10 mg/hr.  - Can decrease milrinone to 0.125.  - Add hydralazine 25 mg tid  and increase isordil to 20 tid.  - No beta blocker yet.  - No ACEI/ARB/ARNI or spironolactone with higher creatinine.   3. ID: Afebrile.  4. Acute hypoxemic respiratory failure: Still intubated.  Diurese, hopefully extubate soon.  5. Atrial flutter/fibrillation: DCCV 4/22, brief fibrillation last night.  NSR today.   - Continue amiodarone gtt.  - On heparin gtt.  6. Pulmonary embolus: Small RLL PE seen on CTA chest 4/14.   - On heparin gtt, eventually would transition over to Eliquis.  7. AKI: On CKD stage 3.  Follow closely with diuresis.  Creatinine 2 today.   CRITICAL CARE Performed by: Loralie Champagne  Total critical care time: 35 minutes  Critical care time was exclusive of separately billable procedures and treating other patients.  Critical care was necessary to treat or prevent imminent or life-threatening deterioration.  Critical care was time spent personally by  me on the following activities: development of treatment plan with patient and/or surrogate as well as nursing, discussions with consultants, evaluation of patient's response to treatment, examination of patient, obtaining history from patient or surrogate, ordering and performing treatments and interventions, ordering and review of laboratory studies, ordering and review of radiographic studies, pulse oximetry and re-evaluation of patient's condition.  Loralie Champagne 08/18/2019 12:40 PM

## 2019-08-18 NOTE — Progress Notes (Signed)
Answered code; called to notify pt's wife; met pt's wife and stayed w/her until another family member joined her. Offered empathic support and pastoral presence. Will continue to check w/family when possible. Pls page if assistance is needed prior to that time.  [place], [place]   08/18/19 2100  Clinical Encounter Type  Visited With Family

## 2019-08-18 NOTE — Progress Notes (Signed)
ANTICOAGULATION CONSULT NOTE - Follow Up Consult  Pharmacy Consult for Heparin Indication:  PE  No Known Allergies  Patient Measurements: Height: 5\' 9"  (175.3 cm) Weight: 105.6 kg (232 lb 12.9 oz) IBW/kg (Calculated) : 70.7 Heparin Dosing Weight: 90.6 kg  Vital Signs: Temp: 98.8 F (37.1 C) (04/23 1200) Temp Source: Core (04/23 0900) BP: 117/79 (04/23 1200) Pulse Rate: 101 (04/23 1200)  Labs: Recent Labs    08/15/19 1403 08/15/19 1425 08/16/19 1529 08/16/19 1841 08/17/19 0353 08/17/19 0353 08/17/19 0405 08/17/19 1348 08/18/19 0336  HGB 11.0*   < > 11.7*   < > 11.4*   < > 10.9*  --  9.0*  HCT 33.8*   < > 35.3*   < > 34.6*  --  32.0*  --  27.2*  PLT 102*   < > 113*  --  128*  --   --   --  129*  APTT 32  --   --   --   --   --   --   --   --   LABPROT 17.5*  --  14.9  --   --   --   --   --   --   INR 1.5*  --  1.2  --   --   --   --   --   --   HEPARINUNFRC  --   --   --   --   --   --   --   --  <0.10*  CREATININE  --    < > 1.31*   < > 1.55*  --   --  1.99* 2.03*   < > = values in this interval not displayed.    Estimated Creatinine Clearance: 39.4 mL/min (A) (by C-G formula based on SCr of 2.03 mg/dL (H)).  Assessment: 74 year-old male on heparin for NSTEMI, s/p CABG on 4/20. CT chest confirmed PE. Dopplers negative for DVT. No anticoagulation prior to admission.   Hgb 11.4 > 9, pltc 129. No bleeding noted.   Low-dose heparin resumed 4/22 for small PE.  Discussed with Drs. Aundra Dubin and Atkins, will titrate heparin closer to goal level today.  Goal of Therapy:  Heparin level ~ 0.2 Monitor platelets by anticoagulation protocol: Yes   Plan:  - Increase IV heparin to 650 units/hr.  Recheck level in 8 hrs. - Daily heparin level and CBC - Will continue to monitor for any signs/symptoms of bleeding  - Planning to start Eliquis once patient extubated.  Thank you for allowing pharmacy to be a part of this patient's care.  Marguerite Olea,  [facility] Clinical Pharmacist Phone [phone]  08/18/2019 1:36 PM   Please check [email or web address] for unit-specific pharmacist phone numbers

## 2019-08-19 ENCOUNTER — Inpatient Hospital Stay (HOSPITAL_COMMUNITY): Payer: Medicare Other

## 2019-08-19 DIAGNOSIS — I214 Non-ST elevation (NSTEMI) myocardial infarction: Secondary | ICD-10-CM | POA: Diagnosis not present

## 2019-08-19 DIAGNOSIS — I314 Cardiac tamponade: Secondary | ICD-10-CM

## 2019-08-19 DIAGNOSIS — R570 Cardiogenic shock: Secondary | ICD-10-CM | POA: Diagnosis not present

## 2019-08-19 LAB — CBC
HCT: 29.1 % — ABNORMAL LOW (ref 39.0–52.0)
HCT: 28.7 % — ABNORMAL LOW (ref 39.0–52.0)
Hemoglobin: 10.1 g/dL — ABNORMAL LOW (ref 13.0–17.0)
Hemoglobin: 9.8 g/dL — ABNORMAL LOW (ref 13.0–17.0)
MCH: 30.1 pg (ref 26.0–34.0)
MCH: 30.2 pg (ref 26.0–34.0)
MCHC: 34.7 g/dL (ref 30.0–36.0)
MCHC: 34.1 g/dL (ref 30.0–36.0)
MCV: 86.9 fL (ref 80.0–100.0)
MCV: 88.3 fL (ref 80.0–100.0)
Platelets: 123 10*3/uL — ABNORMAL LOW (ref 150–400)
Platelets: 110 10*3/uL — ABNORMAL LOW (ref 150–400)
RBC: 3.35 MIL/uL — ABNORMAL LOW (ref 4.22–5.81)
RBC: 3.25 MIL/uL — ABNORMAL LOW (ref 4.22–5.81)
RDW: 17.1 % — ABNORMAL HIGH (ref 11.5–15.5)
RDW: 17.2 % — ABNORMAL HIGH (ref 11.5–15.5)
WBC: 15.6 10*3/uL — ABNORMAL HIGH (ref 4.0–10.5)
WBC: 14.4 10*3/uL — ABNORMAL HIGH (ref 4.0–10.5)
nRBC: 2 % — ABNORMAL HIGH (ref 0.0–0.2)
nRBC: 0.9 % — ABNORMAL HIGH (ref 0.0–0.2)

## 2019-08-19 LAB — TYPE AND SCREEN
ABO/RH(D): O POS
Antibody Screen: NEGATIVE
Unit division: 0
Unit division: 0

## 2019-08-19 LAB — POCT I-STAT 7, (LYTES, BLD GAS, ICA,H+H)
Acid-Base Excess: 3 mmol/L — ABNORMAL HIGH (ref 0.0–2.0)
Acid-Base Excess: 5 mmol/L — ABNORMAL HIGH (ref 0.0–2.0)
Bicarbonate: 25.4 mmol/L (ref 20.0–28.0)
Bicarbonate: 28.4 mmol/L — ABNORMAL HIGH (ref 20.0–28.0)
Calcium, Ion: 0.99 mmol/L — ABNORMAL LOW (ref 1.15–1.40)
Calcium, Ion: 1.03 mmol/L — ABNORMAL LOW (ref 1.15–1.40)
HCT: 30 % — ABNORMAL LOW (ref 39.0–52.0)
HCT: 28 % — ABNORMAL LOW (ref 39.0–52.0)
Hemoglobin: 10.2 g/dL — ABNORMAL LOW (ref 13.0–17.0)
Hemoglobin: 9.5 g/dL — ABNORMAL LOW (ref 13.0–17.0)
O2 Saturation: 100 %
O2 Saturation: 100 %
Patient temperature: 36.3
Patient temperature: 36.4
Potassium: 2.8 mmol/L — ABNORMAL LOW (ref 3.5–5.1)
Potassium: 2.9 mmol/L — ABNORMAL LOW (ref 3.5–5.1)
Sodium: 144 mmol/L (ref 135–145)
Sodium: 144 mmol/L (ref 135–145)
TCO2: 26 mmol/L (ref 22–32)
TCO2: 29 mmol/L (ref 22–32)
pCO2 arterial: 29.1 mmHg — ABNORMAL LOW (ref 32.0–48.0)
pCO2 arterial: 34.2 mmHg (ref 32.0–48.0)
pH, Arterial: 7.546 — ABNORMAL HIGH (ref 7.350–7.450)
pH, Arterial: 7.526 — ABNORMAL HIGH (ref 7.350–7.450)
pO2, Arterial: 168 mmHg — ABNORMAL HIGH (ref 83.0–108.0)
pO2, Arterial: 177 mmHg — ABNORMAL HIGH (ref 83.0–108.0)

## 2019-08-19 LAB — GLUCOSE, CAPILLARY
Glucose-Capillary: 226 mg/dL — ABNORMAL HIGH (ref 70–99)
Glucose-Capillary: 212 mg/dL — ABNORMAL HIGH (ref 70–99)
Glucose-Capillary: 200 mg/dL — ABNORMAL HIGH (ref 70–99)
Glucose-Capillary: 193 mg/dL — ABNORMAL HIGH (ref 70–99)
Glucose-Capillary: 156 mg/dL — ABNORMAL HIGH (ref 70–99)
Glucose-Capillary: 131 mg/dL — ABNORMAL HIGH (ref 70–99)
Glucose-Capillary: 94 mg/dL (ref 70–99)
Glucose-Capillary: 110 mg/dL — ABNORMAL HIGH (ref 70–99)
Glucose-Capillary: 102 mg/dL — ABNORMAL HIGH (ref 70–99)
Glucose-Capillary: 126 mg/dL — ABNORMAL HIGH (ref 70–99)
Glucose-Capillary: 117 mg/dL — ABNORMAL HIGH (ref 70–99)
Glucose-Capillary: 152 mg/dL — ABNORMAL HIGH (ref 70–99)
Glucose-Capillary: 144 mg/dL — ABNORMAL HIGH (ref 70–99)
Glucose-Capillary: 145 mg/dL — ABNORMAL HIGH (ref 70–99)
Glucose-Capillary: 128 mg/dL — ABNORMAL HIGH (ref 70–99)
Glucose-Capillary: 116 mg/dL — ABNORMAL HIGH (ref 70–99)
Glucose-Capillary: 112 mg/dL — ABNORMAL HIGH (ref 70–99)
Glucose-Capillary: 112 mg/dL — ABNORMAL HIGH (ref 70–99)

## 2019-08-19 LAB — COMPREHENSIVE METABOLIC PANEL
ALT: 1499 U/L — ABNORMAL HIGH (ref 0–44)
ALT: 1491 U/L — ABNORMAL HIGH (ref 0–44)
AST: 1839 U/L — ABNORMAL HIGH (ref 15–41)
AST: 1649 U/L — ABNORMAL HIGH (ref 15–41)
Albumin: 2 g/dL — ABNORMAL LOW (ref 3.5–5.0)
Albumin: 2.2 g/dL — ABNORMAL LOW (ref 3.5–5.0)
Alkaline Phosphatase: 60 U/L (ref 38–126)
Alkaline Phosphatase: 63 U/L (ref 38–126)
Anion gap: 17 — ABNORMAL HIGH (ref 5–15)
Anion gap: 14 (ref 5–15)
BUN: 26 mg/dL — ABNORMAL HIGH (ref 8–23)
BUN: 34 mg/dL — ABNORMAL HIGH (ref 8–23)
CO2: 22 mmol/L (ref 22–32)
CO2: 24 mmol/L (ref 22–32)
Calcium: 7.2 mg/dL — ABNORMAL LOW (ref 8.9–10.3)
Calcium: 7.5 mg/dL — ABNORMAL LOW (ref 8.9–10.3)
Chloride: 105 mmol/L (ref 98–111)
Chloride: 105 mmol/L (ref 98–111)
Creatinine, Ser: 2.97 mg/dL — ABNORMAL HIGH (ref 0.61–1.24)
Creatinine, Ser: 3.52 mg/dL — ABNORMAL HIGH (ref 0.61–1.24)
GFR calc Af Amer: 23 mL/min — ABNORMAL LOW (ref >60)
GFR calc Af Amer: 19 mL/min — ABNORMAL LOW (ref >60)
GFR calc non Af Amer: 20 mL/min — ABNORMAL LOW (ref >60)
GFR calc non Af Amer: 16 mL/min — ABNORMAL LOW (ref >60)
Glucose, Bld: 248 mg/dL — ABNORMAL HIGH (ref 70–99)
Glucose, Bld: 162 mg/dL — ABNORMAL HIGH (ref 70–99)
Potassium: 2.9 mmol/L — ABNORMAL LOW (ref 3.5–5.1)
Potassium: 3.7 mmol/L (ref 3.5–5.1)
Sodium: 144 mmol/L (ref 135–145)
Sodium: 143 mmol/L (ref 135–145)
Total Bilirubin: 0.7 mg/dL (ref 0.3–1.2)
Total Bilirubin: 0.7 mg/dL (ref 0.3–1.2)
Total Protein: 4.3 g/dL — ABNORMAL LOW (ref 6.5–8.1)
Total Protein: 4.6 g/dL — ABNORMAL LOW (ref 6.5–8.1)

## 2019-08-19 LAB — COOXEMETRY PANEL
Carboxyhemoglobin: 1.1 % (ref 0.5–1.5)
Methemoglobin: 1.4 % (ref 0.0–1.5)
O2 Saturation: 66.3 %
Total hemoglobin: 10.8 g/dL — ABNORMAL LOW (ref 12.0–16.0)

## 2019-08-19 LAB — MAGNESIUM: Magnesium: 1.9 mg/dL (ref 1.7–2.4)

## 2019-08-19 LAB — PROTIME-INR
INR: 1.5 — ABNORMAL HIGH (ref 0.8–1.2)
INR: 1.3 — ABNORMAL HIGH (ref 0.8–1.2)
Prothrombin Time: 18 seconds — ABNORMAL HIGH (ref 11.4–15.2)
Prothrombin Time: 16.3 seconds — ABNORMAL HIGH (ref 11.4–15.2)

## 2019-08-19 LAB — BPAM RBC
Blood Product Expiration Date: 202105252359
Blood Product Expiration Date: 202105252359
ISSUE DATE / TIME: 202104231926
ISSUE DATE / TIME: 202104231926
Unit Type and Rh: 5100
Unit Type and Rh: 5100

## 2019-08-19 LAB — HEPARIN LEVEL (UNFRACTIONATED): Heparin Unfractionated: <0.1 IU/mL — ABNORMAL LOW (ref 0.30–0.70)

## 2019-08-19 LAB — TRIGLYCERIDES: Triglycerides: 102 mg/dL (ref <150)

## 2019-08-19 LAB — BASIC METABOLIC PANEL
Anion gap: 13 (ref 5–15)
BUN: 29 mg/dL — ABNORMAL HIGH (ref 8–23)
CO2: 26 mmol/L (ref 22–32)
Calcium: 7.5 mg/dL — ABNORMAL LOW (ref 8.9–10.3)
Chloride: 106 mmol/L (ref 98–111)
Creatinine, Ser: 3.03 mg/dL — ABNORMAL HIGH (ref 0.61–1.24)
GFR calc Af Amer: 23 mL/min — ABNORMAL LOW (ref >60)
GFR calc non Af Amer: 20 mL/min — ABNORMAL LOW (ref >60)
Glucose, Bld: 121 mg/dL — ABNORMAL HIGH (ref 70–99)
Potassium: 3.1 mmol/L — ABNORMAL LOW (ref 3.5–5.1)
Sodium: 145 mmol/L (ref 135–145)

## 2019-08-19 LAB — ECHOCARDIOGRAM LIMITED
Height: 69 in
Weight: 3774.28 oz

## 2019-08-19 MED ORDER — VANCOMYCIN VARIABLE DOSE PER UNSTABLE RENAL FUNCTION (PHARMACIST DOSING): Status: DC

## 2019-08-19 MED ORDER — TROSPIUM CHLORIDE 20 MG PO TABS
20.0000 mg | ORAL_TABLET | Freq: Two times a day (BID) | ORAL | Status: DC
  Administered 2019-08-19 – 2019-10-13 (×94): 20 mg
  Filled 2019-08-19 (×114): qty 1

## 2019-08-19 MED ORDER — THIAMINE HCL 100 MG/ML IJ SOLN
Freq: Once | INTRAVENOUS | Status: AC
  Filled 2019-08-19: qty 1000

## 2019-08-19 MED ORDER — POTASSIUM CHLORIDE 10 MEQ/50ML IV SOLN
10.0000 meq | INTRAVENOUS | Status: AC
  Administered 2019-08-19 (×2): 10 meq via INTRAVENOUS
  Filled 2019-08-19: qty 50

## 2019-08-19 MED ORDER — CHLORHEXIDINE GLUCONATE 0.12% ORAL RINSE (MEDLINE KIT)
15.0000 mL | Freq: Two times a day (BID) | OROMUCOSAL | Status: DC
  Administered 2019-08-19 – 2019-08-20 (×2): 15 mL via OROMUCOSAL

## 2019-08-19 MED ORDER — CLOPIDOGREL BISULFATE 75 MG PO TABS
75.0000 mg | ORAL_TABLET | Freq: Every day | ORAL | Status: DC
  Administered 2019-08-19 – 2019-09-08 (×18): 75 mg
  Filled 2019-08-19 (×18): qty 1

## 2019-08-19 MED ORDER — DOCUSATE SODIUM 50 MG/5ML PO LIQD
100.0000 mg | Freq: Two times a day (BID) | ORAL | Status: DC
  Administered 2019-08-19 – 2019-09-17 (×34): 100 mg
  Filled 2019-08-19 (×38): qty 10

## 2019-08-19 MED ORDER — ORAL CARE MOUTH RINSE: 15.0000 mL | OROMUCOSAL | Status: DC

## 2019-08-19 MED ORDER — LACTATED RINGERS IV BOLUS: 500.0000 mL | Freq: Once | INTRAVENOUS | Status: DC

## 2019-08-19 MED ORDER — PANTOPRAZOLE SODIUM 40 MG PO PACK
40.0000 mg | PACK | Freq: Every day | ORAL | Status: DC
  Administered 2019-08-19 – 2019-10-09 (×44): 40 mg
  Filled 2019-08-19 (×49): qty 20

## 2019-08-19 MED ORDER — ALBUMIN HUMAN 5 % IV SOLN
12.5000 g | Freq: Once | INTRAVENOUS | Status: AC
  Administered 2019-08-19: 12.5 g via INTRAVENOUS
  Filled 2019-08-19: qty 250

## 2019-08-19 MED ORDER — POTASSIUM CHLORIDE 10 MEQ/50ML IV SOLN
10.0000 meq | INTRAVENOUS | Status: AC
  Administered 2019-08-19 (×2): 10 meq via INTRAVENOUS
  Filled 2019-08-19 (×3): qty 50

## 2019-08-19 MED ORDER — MIDAZOLAM HCL 2 MG/2ML IJ SOLN
1.0000 mg | INTRAMUSCULAR | Status: DC | PRN
  Administered 2019-08-19 – 2019-08-20 (×6): 2 mg via INTRAVENOUS
  Administered 2019-08-25: 1 mg via INTRAVENOUS
  Filled 2019-08-19 (×6): qty 2

## 2019-08-19 MED ORDER — ATORVASTATIN CALCIUM 40 MG PO TABS: 40.0000 mg | ORAL_TABLET | Freq: Every evening | ORAL | Status: DC

## 2019-08-19 MED ORDER — ASPIRIN 81 MG PO CHEW
81.0000 mg | CHEWABLE_TABLET | Freq: Every day | ORAL | Status: DC
  Administered 2019-08-19 – 2019-10-13 (×49): 81 mg
  Filled 2019-08-19 (×50): qty 1

## 2019-08-19 MED FILL — Medication: Qty: 1 | Status: AC

## 2019-08-19 NOTE — Progress Notes (Signed)
Versed 35ml wasted in steri-cycle witnessed by Etta Quill, RN

## 2019-08-19 NOTE — Progress Notes (Signed)
Advanced Heart Failure Rounding Note  PCP-Cardiologist: No primary care provider on file.   Subjective:    Admitted 08/09/19 with NSTEM and ? Small RLL PE  CABG x4 on 08/15/19 Cardiac arrest. Chest opened. 4/23  Had cardiac arrest yesterday with emergent sternotomy at the bedside for tamponade.  Underwent washout with clot removal.  Remains intubated and sedated on epinephrine 5 and norepinephrine 5.  Bicarb drip stopped this morning.  Echo today EF 40 to 45% with some residual pericardial clot.  PAP 24/9 CVP 6 Thermo 4.5/2.1 CO-OX 6%.   Objective:   Weight Range: 107 kg Body mass index is 34.84 kg/m.   Vital Signs:   Temp:  [96.3 F (35.7 C)-99.5 F (37.5 C)] 97.7 F (36.5 C) (04/24 0830) Pulse Rate:  [53-134] 86 (04/24 0830) Resp:  [18-37] 18 (04/24 0830) BP: (54-148)/(24-91) 127/55 (04/24 0817) SpO2:  [93 %-100 %] 100 % (04/24 0830) Arterial Line BP: (112-133)/(53-85) 133/57 (04/24 0830) FiO2 (%):  [50 %-100 %] 50 % (04/24 0817) Weight:  [107 kg] 107 kg (04/24 0500) Last BM Date: 08/13/19  Weight change: Filed Weights   08/17/19 0428 08/18/19 0500 08/19/19 0500  Weight: 103.8 kg 105.6 kg 107 kg    Intake/Output:   Intake/Output Summary (Last 24 hours) at 08/19/2019 1115 Last data filed at 08/19/2019 1100 Gross per 24 hour  Intake 4642.23 ml  Output 1905 ml  Net 2737.23 ml      Physical Exam   General:  Intubated sedated  HEENT: normal +ETT Neck: supple. RIJ swan Carotids 2+ bilat; no bruits. No lymphadenopathy or thryomegaly appreciated. Cor: Chest open.  + chest tubes.  Regular Lungs: Coarse Abdomen: soft, nontender, nondistended. No hepatosplenomegaly. No bruits or masses. Good bowel sounds. Extremities: no cyanosis, clubbing, rash, 1+ edema Neuro: Intubated sedated  Telemetry   NSR 80-90s personally revewed.   EKG    N/a   Labs    CBC Recent Labs    08/17/19 0353 08/17/19 0405 08/18/19 2002 08/18/19 2003 08/19/19 0300  08/19/19 0300 08/19/19 0312 08/19/19 0609  WBC 13.7*   < > 16.0*  --  15.6*  --   --   --   NEUTROABS 11.0*  --   --   --   --   --   --   --   HGB 11.4*   < > 10.3*   < > 10.1*   < > 10.2* 9.5*  HCT 34.6*   < > 31.1*   < > 29.1*   < > 30.0* 28.0*  MCV 93.8   < > 91.2  --  86.9  --   --   --   PLT 128*   < > 123*  --  123*  --   --   --    < > = values in this interval not displayed.   Basic Metabolic Panel Recent Labs    08/17/19 0353 08/17/19 0405 08/18/19 1500 08/18/19 1543 08/19/19 0300 08/19/19 0312 08/19/19 0609 08/19/19 1026  NA 137   < > 138   < > 144   < > 144 145  K 4.2   < > 3.8   < > 2.9*   < > 2.9* 3.1*  CL 106   < > 108   < > 105  --   --  106  CO2 21*   < > 18*   < > 22  --   --  26  GLUCOSE 147*   < >  131*   < > 248*  --   --  121*  BUN 10   < > 21   < > 26*  --   --  29*  CREATININE 1.55*   < > 2.27*   < > 2.97*  --   --  3.03*  CALCIUM 7.9*   < > 7.9*   < > 7.2*  --   --  7.5*  MG 2.2  --   --   --  1.9  --   --   --   PHOS 2.8  --  3.3  --   --   --   --   --    < > = values in this interval not displayed.   Liver Function Tests Recent Labs    08/18/19 2002 08/19/19 0300  AST 568* 1,839*  ALT 485* 1,499*  ALKPHOS 54 60  BILITOT 0.9 0.7  PROT 4.1* 4.3*  ALBUMIN 2.1* 2.0*   No results for input(s): LIPASE, AMYLASE in the last 72 hours. Cardiac Enzymes No results for input(s): CKTOTAL, CKMB, CKMBINDEX, TROPONINI in the last 72 hours.  BNP: BNP (last 3 results) Recent Labs    08/09/19 1240  BNP 89.1    ProBNP (last 3 results) Recent Labs    11/23/18 1120 08/09/19 0922  PROBNP 18.0 78.0     D-Dimer No results for input(s): DDIMER in the last 72 hours. Hemoglobin A1C No results for input(s): HGBA1C in the last 72 hours. Fasting Lipid Panel Recent Labs    08/19/19 0600  TRIG 102   Thyroid Function Tests No results for input(s): TSH, T4TOTAL, T3FREE, THYROIDAB in the last 72 hours.  Invalid input(s): FREET3  Other  results:   Imaging    DG Chest 1 View  Result Date: 08/18/2019 CLINICAL DATA:  History of open heart surgery. CABG 08/15/2019 EXAM: CHEST  1 VIEW COMPARISON:  Radiograph earlier this day. FINDINGS: Interval extubation and removal of enteric tube. Slightly lower lung volumes. Right internal jugular Swan-Ganz catheter remains in place with tip in the region of the main pulmonary outflow tract. Chest tubes and mediastinal drains remain in place. Recent median sternotomy with skin staples in place. Stable cardiomegaly. Worsening hazy opacity in the left lower lung zone, likely combination of pleural fluid and atelectasis/airspace disease. Minimal scarring in the right mid lung. No visualized pneumothorax. IMPRESSION: 1. Interval extubation.  Slightly lower lung volumes. 2. Worsening hazy opacity in the left lower lung zone, likely combination of pleural fluid and atelectasis/airspace disease. 3. Stable cardiomegaly. Electronically Signed   By: Keith Rake M.D.   On: 08/18/2019 18:31   DG Abd 1 View  Result Date: 08/18/2019 CLINICAL DATA:  Evaluate OG tube. EXAM: ABDOMEN - 1 VIEW COMPARISON:  August 16, 2019 FINDINGS: The OG tube terminates in the region of the gastric antrum. IMPRESSION: The OG tube terminates in the region of the gastric antrum. Electronically Signed   By: Dorise Bullion III M.D   On: 08/18/2019 20:41   DG CHEST PORT 1 VIEW  Result Date: 08/18/2019 CLINICAL DATA:  Myocardial infarction.  OG tube. EXAM: PORTABLE CHEST 1 VIEW COMPARISON:  August 18, 2019 FINDINGS: The ETT is in good position. A PA catheter is stable. Bilateral chest tubes are noted. A mediastinal drain and appears to been advanced, terminating centrally over the upper minutes Dyna. No OG tube identified. No pneumothorax. A transcutaneous lead partially obscures the right upper chest. Mild atelectasis in the left base. Stable cardiomediastinal  silhouette. IMPRESSION: 1. Support apparatus as above. Apparent advancement  of the mediastinal drain with the distal tip projected over the superior mediastinum at midline. No OG tube seen. Recommend clinical correlation. 2. No other acute interval changes. Electronically Signed   By: Dorise Bullion III M.D   On: 09/17/2019 20:40   ECHOCARDIOGRAM LIMITED  Result Date: 08/19/2019    ECHOCARDIOGRAM LIMITED REPORT   Patient Name:   Christian [name] Date of Exam: 08/19/2019 Medical Rec #:  [id number]       Height:       69.0 in Accession #:    [id number]      Weight:       235.9 lb Date of Birth:  [birth year]      BSA:          2.216 m Patient Age:    75 years        BP:           101/62 mmHg Patient Gender: M               HR:           87 bpm. Exam Location:  Inpatient Procedure: Limited Echo, Limited Color Doppler and Cardiac Doppler STAT ECHO Indications:    Tamponade 423.3 / I31.4  History:        Patient has prior history of Echocardiogram examinations, most                 recent 08/15/2019. CAD and Previous Myocardial Infarction, Prior                 CABG; Risk Factors:Hypertension and Dyslipidemia. 4/14 pulmonay                 embolism with no evidence of right heart strain. Chronic kidney                 disease. Patient coded on Sep 17, 2022 manual cardiac massage was                 performed and a dense clot was evacuated from behind the RV.  Sonographer:    Darlina Sicilian RDCS Referring Phys: [id number] BROADUS Z ATKINS IMPRESSIONS  1. There prominent respiratory displacement of the interventricular septum, consistent with enhanced ventricular interdependence, likely due to constrictive physiology.. Left ventricular ejection fraction, by estimation, is 45 to 50%. The left ventricle  has mildly decreased function. The left ventricle demonstrates global hypokinesis. Left ventricular diastolic parameters are consistent with Grade II diastolic dysfunction (pseudonormalization). Elevated left atrial pressure.  2. Right ventricular systolic function was not well visualized. The right  ventricular size is normal. Tricuspid regurgitation signal is inadequate for assessing PA pressure.  3. No pericardial fluid is see, but the pericardium is very thick and thrombus in the pericardial space cannot be ecluded.  4. The mitral valve is normal in structure. No evidence of mitral valve regurgitation.  5. The aortic valve is grossly normal. Aortic valve regurgitation is not visualized. Mild aortic valve sclerosis is present, with no evidence of aortic valve stenosis. Comparison(s): Prior images reviewed side by side. The left ventricular function has improved. The anteroseptal and apical left ventricular wall motion abnormality is improved. There is abnormal septal motion consistent with constrictive physiology. FINDINGS  Left Ventricle: There prominent respiratory displacement of the interventricular septum, consistent with enhanced ventricular interdependence, likely due to constrictive physiology. Left ventricular ejection fraction, by estimation, is 45 to 50%. The left ventricle has mildly decreased  function. The left ventricle demonstrates global hypokinesis. Abnormal (paradoxical) septal motion consistent with post-operative status. Elevated left atrial pressure. Right Ventricle: The right ventricular size is normal. Right ventricular systolic function was not well visualized. Tricuspid regurgitation signal is inadequate for assessing PA pressure. Left Atrium: Left atrial size was normal in size. Right Atrium: Right atrial size was not well visualized. Pericardium: No pericardial fluid is see, but the pericardium is very thick and thrombus in the pericardial space cannot be ecluded. Mitral Valve: The mitral valve is normal in structure. Mild mitral annular calcification. Tricuspid Valve: The tricuspid valve is not well visualized. Tricuspid valve regurgitation is not demonstrated. Aortic Valve: The aortic valve is grossly normal. Aortic valve regurgitation is not visualized. Mild aortic valve  sclerosis is present, with no evidence of aortic valve stenosis. Pulmonic Valve: The pulmonic valve was not well visualized. Pulmonic valve regurgitation is mild. IAS/Shunts: The interatrial septum was not well visualized.   LV Volumes (MOD) LV vol d, MOD A2C: 55.9 ml  Diastology LV vol d, MOD A4C: 105.0 ml LV e' lateral:   4.46 cm/s LV vol s, MOD A2C: 27.4 ml  LV E/e' lateral: 18.8 LV vol s, MOD A4C: 56.0 ml  LV e' medial:    5.44 cm/s LV SV MOD A2C:     28.5 ml  LV E/e' medial:  15.4 LV SV MOD A4C:     105.0 ml LV SV MOD BP:      40.0 ml AORTIC VALVE LVOT Vmax:   87.70 cm/s LVOT Vmean:  49.400 cm/s LVOT VTI:    0.092 m MITRAL VALVE MV Area (PHT): 4.31 cm    SHUNTS MV Decel Time: 176 msec    Systemic VTI: 0.09 m MV E velocity: 84.00 cm/s MV A velocity: 67.10 cm/s MV E/A ratio:  1.25 Mihai Croitoru MD Electronically signed by Sanda Klein MD Signature Date/Time: 08/19/2019/8:24:04 AM    Final      Medications:     Scheduled Medications: . sodium chloride   Intravenous Once  . sodium chloride   Intravenous Once  . acetaminophen  1,000 mg Oral Q6H   Or  . acetaminophen (TYLENOL) oral liquid 160 mg/5 mL  1,000 mg Per Tube Q6H  . aspirin  81 mg Per Tube Daily  . atorvastatin  40 mg Per Tube QPM  . bisacodyl  10 mg Oral Daily   Or  . bisacodyl  10 mg Rectal Daily  . chlorhexidine  15 mL Mouth Rinse BID  . chlorhexidine gluconate (MEDLINE KIT)  15 mL Mouth Rinse BID  . chlorhexidine gluconate (MEDLINE KIT)  15 mL Mouth Rinse BID  . Chlorhexidine Gluconate Cloth  6 each Topical Daily  . [START ON 08/20/2019] clopidogrel  75 mg Per Tube Daily  . docusate  100 mg Per Tube BID  . fentaNYL (SUBLIMAZE) injection  25 mcg Intravenous Once  . isosorbide dinitrate  20 mg Per Tube TID  . levalbuterol  0.63 mg Nebulization Q6H  . mouth rinse  15 mL Mouth Rinse 10 times per day  . mouth rinse  15 mL Mouth Rinse q12n4p  . methylPREDNISolone (SOLU-MEDROL) injection  40 mg Intravenous Q6H  . pantoprazole  sodium  40 mg Per Tube Daily  . potassium chloride  40 mEq Per Tube Once  . sodium chloride flush  10-40 mL Intracatheter Q12H  . sodium chloride flush  3 mL Intravenous Q12H  . tamsulosin  0.4 mg Oral QPC breakfast  . trospium  20 mg  Per Tube BID    Infusions: . sodium chloride Stopped (08/16/19 1631)  . sodium chloride    . sodium chloride    . sodium chloride 20 mL/hr at 08/19/19 1100  . amiodarone 30 mg/hr (08/19/19 1100)  . ceFEPime (MAXIPIME) IV Stopped (08/19/19 0438)  . epinephrine 5 mcg/min (08/19/19 1100)  . fentaNYL infusion INTRAVENOUS 100 mcg/hr (08/19/19 1100)  . insulin 0.7 mL/hr at 08/19/19 1100  . lactated ringers    . lactated ringers    . lactated ringers    . lactated ringers 20 mL/hr at 08/16/19 1600  . magnesium sulfate bolus IVPB    . milrinone Stopped (08/18/19 1859)  . norepinephrine (LEVOPHED) Adult infusion 5 mcg/min (08/19/19 1100)  . sodium bicarbonate 150 mEq in dextrose 5% 1000 mL Stopped (08/19/19 0707)    PRN Medications: sodium chloride, cromolyn, dextrose, fentaNYL, hydrALAZINE, lactated ringers, midazolam, morphine injection, ondansetron (ZOFRAN) IV, oxyCODONE, sodium chloride flush, sodium chloride flush, traMADol     Assessment/Plan   1. CAD:  - s/p PDA angioplasty + stent in 02/2014 - Admitted for NSTEMI 4/14. Hs Trop peaked at [place] w/ severe 3V CAD. Echo w/ reduced EF 30-35%. RV ok. - S/p CABG x 4 on 4/20 (LIMA-LAD, RIMA to PDA, Lt radial artery graft to 1st OM and distal OM branches of LCx) - Underwent urgent sternotomy at the bedside for cardiac tamponade and removal of clot on 4/23 - Remains intubated - continue ASA + statin  - Radial harvest. On isordil.   2. Acute Systolic Heart Failure/ Ischemic Cardiomyopathy -> cardiogenic shock - LVEF severely reduced at 30-35%, in the setting of NSTEM/ Multivessel CAD. RV ok. - Underwent urgent sternotomy at the bedside for cardiac tamponade and removal of clot on 4/23 - Chest  remains open on norepinephrine 5 and epinephrine 5.  Hemodynamics improved but marginal - CVP low this morning.  Getting fluid per TCTS - Plan to return to the OR on Monday for repeat washout and possible re-closure - Echo this morning.  EF 40 to 45% inotropic support.  Small amount of residual pericardial clot.  RV moderately hypokinetic - Continue supportive care.  Adjust inotropes as needed  3.  Postoperative acute hypoxic respiratory failure - Reintubated on 4/23 due to tach arrest and worsening cardiogenic shock. - Keep intubated/sedated while chest open. - CCM following. -Sedate with Versed and fentanyl.  Stop propofol.  4. 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 -Continue supportive care.  Hopefully will recover.  May need CVVHD.  5.  Shock liver - Due to cardiac arrest on 4/23 - LFTs very high.  Will hold statin for now and follow.  6. 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  - Continue amio drip.  - Off heparin currently with open chest and recent tamponade, may be able to restart soon low-dose  7.  Possible small right lower lobe PE: - Chest CT 4/14 w/ small RLL PE - Echo shows normal RV. No strain - Bilateral Venous Dopplers negative for DVT - Off heparin currently with open chest and recent tamponade, may be able to restart soon low-dose - Anticipate switching eliquis prior to d/c.    8. T2DM - Hgb A1c 7.7 -Remains on insulin  CRITICAL CARE Performed by: Glori Bickers  Total critical care time: 45 minutes  Critical care time was exclusive of separately billable procedures and treating  other patients.  Critical care was necessary to treat or prevent imminent or life-threatening deterioration.  Critical care was time spent personally by me (independent of midlevel providers or residents) on the following activities: development of treatment plan with patient and/or surrogate  as well as nursing, discussions with consultants, evaluation of patient's response to treatment, examination of patient, obtaining history from patient or surrogate, ordering and performing treatments and interventions, ordering and review of laboratory studies, ordering and review of radiographic studies, pulse oximetry and re-evaluation of patient's condition.    Length of Stay: Catarina, MD  08/19/2019, 11:15 AM  Advanced Heart Failure Team Pager [phone] (M-F; [place])  Please contact [facility] for night-coverage after hours (4p -7a ) and weekends on [email or web address]

## 2019-08-19 NOTE — Progress Notes (Signed)
Subjective:  Intubated and sedated following cardiac arrest and chest opened at bedside last pm. Reportedly opened eyes this morning but not following commands.  Objective: Vital signs in last 24 hours: Temp:  [96.3 F (35.7 C)-99.5 F (37.5 C)] 97.7 F (36.5 C) (04/24 0830) Pulse Rate:  [53-134] 86 (04/24 0830) Cardiac Rhythm: Normal sinus rhythm (04/24 0730) Resp:  [17-37] 18 (04/24 0830) BP: (54-148)/(24-93) 127/55 (04/24 0817) SpO2:  [93 %-100 %] 100 % (04/24 0830) Arterial Line BP: (112-133)/(53-85) 133/57 (04/24 0830) FiO2 (%):  [50 %-100 %] 50 % (04/24 0817) Weight:  [536 kg] 107 kg (04/24 0500)  Hemodynamic parameters for last 24 hours: PAP: (22-35)/(8-23) 23/9 CVP:  [9 mmHg-17 mmHg] 9 mmHg CO:  [2.8 L/min-6.3 L/min] 4.5 L/min CI:  [1.3 L/min/m2-2.9 L/min/m2] 2.1 L/min/m2  Intake/Output from previous day: 04/23 0701 - 04/24 0700 In: 4222.2 [I.V.:3602.2; IV Piggyback:620] Out: 2130 [Urine:1022; Chest Tube:1108] Intake/Output this shift: Total I/O In: 191.7 [I.V.:191.7] Out: 52 [Urine:32; Chest Tube:20]  General appearance: intubated on vent Neurologic: unable to assess. No spontaneous movement. Heart: regular rate and rhythm, S1, S2 normal, no murmur Lungs: clear to auscultation bilaterally Abdomen: soft, no bowel sounds  Extremities: edema moderate Wound: chest dressing intact  Lab Results: Recent Labs    08/18/19 2002 08/18/19 2003 08/19/19 0300 08/19/19 0300 08/19/19 0312 08/19/19 0609  WBC 16.0*  --  15.6*  --   --   --   HGB 10.3*   < > 10.1*   < > 10.2* 9.5*  HCT 31.1*   < > 29.1*   < > 30.0* 28.0*  PLT 123*  --  123*  --   --   --    < > = values in this interval not displayed.   BMET:  Recent Labs    08/18/19 2002 08/18/19 2003 08/19/19 0300 08/19/19 0300 08/19/19 0312 08/19/19 0609  NA 148*   < > 144   < > 144 144  K 2.9*   < > 2.9*   < > 2.8* 2.9*  CL 103  --  105  --   --   --   CO2 17*  --  22  --   --   --   GLUCOSE 231*  --   248*  --   --   --   BUN 23  --  26*  --   --   --   CREATININE 2.91*  --  2.97*  --   --   --   CALCIUM 6.9*  --  7.2*  --   --   --    < > = values in this interval not displayed.    PT/INR:  Recent Labs    08/19/19 0300  LABPROT 18.0*  INR 1.5*   ABG    Component Value Date/Time   PHART 7.526 (H) 08/19/2019 0609   HCO3 28.4 (H) 08/19/2019 0609   TCO2 29 08/19/2019 0609   ACIDBASEDEF 5.0 (H) 08/18/2019 2341   O2SAT 100.0 08/19/2019 0609   CBG (last 3)  Recent Labs    08/19/19 0700 08/19/19 0759 08/19/19 0853  GLUCAP 193* 156* 131*   CXR: fairly clear lungs, no significant pleural effusions. Assessment/Plan:  S/P cardiac arrest with chest opening at bedside. Felt to be due to tamponade with clot removed from pericardium posteriorly. Restoration of normal hemodynamics after resuscitation.   He has been hemodynamically stable overnight on epi 5, NE 5 with CI 2.1, SBP 140's, low PAP and  CVP. Echo done this am showed improved LVEF per DB.  VDRF: keep sedated on vent while chest open.  Acute on chronic renal failure: likely exacerbated by recent diuresis and then cardiac arrest. Low filling pressures so giving some fluid per ZA. Hypokalemic and replacing.  Shock liver: follow, maintain stable hemodynamics.  On vanc and maxipime with open chest. Avoid vanc toxicity with ARF.  No anticoagulation with tamponade and open chest.     LOS: 10 days    Gaye Pollack 08/19/2019

## 2019-08-19 NOTE — Progress Notes (Signed)
  Echocardiogram 2D Echocardiogram limited has been performed.  Darlina Sicilian M 08/19/2019, 8:02 AM

## 2019-08-19 NOTE — Progress Notes (Signed)
2 RBCs administered emergently during open chest. Units: [id number] and [id number]

## 2019-08-19 NOTE — Progress Notes (Signed)
Dr Orvan Seen notified of Cre: 3.03 and k: 3.1.  Orders received for 10 meq IV X2.

## 2019-08-19 NOTE — Evaluation (Signed)
SLP Cancellation Note  Patient Details Name: CREGG JUTTE MRN: [id number] DOB: 1945-11-09   Cancelled treatment:       Reason Eval/Treat Not Completed: Medical issues which prohibited therapy(note code blue with cardiac arrest last pm with requiring intubation and intracardiac massage - pt now on ventilator, will sign off, please reorder when/if pt becomes appropriate.)  Kathleen Lime, MS [place] Office [phone]   Macario Golds 08/19/2019, 7:45 AM

## 2019-08-19 NOTE — Consult Note (Signed)
Responded to night chaplain's request for follow-through. Per nurse, wife had just left after talking with doctor, in better spirits than yesterday because pt was doing better today than yesterday. Prayed for pt. Nurse will let wife know that chaplain stopped by, and that she may feel free to ask nurse to page a chaplain at any time there is further need of chaplain services.   Rev. Eloise Levels Chaplain

## 2019-08-19 NOTE — Progress Notes (Signed)
Patient ID: Christian [name], male   DOB: 23-Oct-1945, 74 y.o.   MRN: [id number] TCTS Evening Rounds:  Hemodynamically stable in sinus rhythm. CI 2.6 on epi 5. NE down to 1. Responded to volume today.   UO 20/hr  BMET    Component Value Date/Time   NA 143 08/19/2019 1820   NA 141 06/19/2019 1130   K 3.7 08/19/2019 1820   CL 105 08/19/2019 1820   CO2 24 08/19/2019 1820   GLUCOSE 162 (H) 08/19/2019 1820   BUN 34 (H) 08/19/2019 1820   BUN 21 06/19/2019 1130   CREATININE 3.52 (H) 08/19/2019 1820   CALCIUM 7.5 (L) 08/19/2019 1820   GFRNONAA 16 (L) 08/19/2019 1820   GFRAA 19 (L) 08/19/2019 1820   Chest tube output low.  Opening eyes but not following commands.

## 2019-08-20 ENCOUNTER — Inpatient Hospital Stay (HOSPITAL_COMMUNITY): Payer: Medicare Other

## 2019-08-20 DIAGNOSIS — I214 Non-ST elevation (NSTEMI) myocardial infarction: Secondary | ICD-10-CM | POA: Diagnosis not present

## 2019-08-20 DIAGNOSIS — R570 Cardiogenic shock: Secondary | ICD-10-CM | POA: Diagnosis not present

## 2019-08-20 LAB — COMPREHENSIVE METABOLIC PANEL
ALT: 868 U/L — ABNORMAL HIGH (ref 0–44)
AST: 424 U/L — ABNORMAL HIGH (ref 15–41)
Albumin: 2.2 g/dL — ABNORMAL LOW (ref 3.5–5.0)
Alkaline Phosphatase: 82 U/L (ref 38–126)
Anion gap: 13 (ref 5–15)
BUN: 54 mg/dL — ABNORMAL HIGH (ref 8–23)
CO2: 23 mmol/L (ref 22–32)
Calcium: 7.8 mg/dL — ABNORMAL LOW (ref 8.9–10.3)
Chloride: 107 mmol/L (ref 98–111)
Creatinine, Ser: 4.1 mg/dL — ABNORMAL HIGH (ref 0.61–1.24)
GFR calc Af Amer: 16 mL/min — ABNORMAL LOW (ref >60)
GFR calc non Af Amer: 14 mL/min — ABNORMAL LOW (ref >60)
Glucose, Bld: 154 mg/dL — ABNORMAL HIGH (ref 70–99)
Potassium: 4.3 mmol/L (ref 3.5–5.1)
Sodium: 143 mmol/L (ref 135–145)
Total Bilirubin: 0.5 mg/dL (ref 0.3–1.2)
Total Protein: 4.5 g/dL — ABNORMAL LOW (ref 6.5–8.1)

## 2019-08-20 LAB — POCT I-STAT 7, (LYTES, BLD GAS, ICA,H+H)
Acid-Base Excess: 5 mmol/L — ABNORMAL HIGH (ref 0.0–2.0)
Bicarbonate: 27.9 mmol/L (ref 20.0–28.0)
Calcium, Ion: 1.04 mmol/L — ABNORMAL LOW (ref 1.15–1.40)
HCT: 27 % — ABNORMAL LOW (ref 39.0–52.0)
Hemoglobin: 9.2 g/dL — ABNORMAL LOW (ref 13.0–17.0)
O2 Saturation: 100 %
Patient temperature: 36.8
Potassium: 3.5 mmol/L (ref 3.5–5.1)
Sodium: 144 mmol/L (ref 135–145)
TCO2: 29 mmol/L (ref 22–32)
pCO2 arterial: 34.7 mmHg (ref 32.0–48.0)
pH, Arterial: 7.513 — ABNORMAL HIGH (ref 7.350–7.450)
pO2, Arterial: 216 mmHg — ABNORMAL HIGH (ref 83.0–108.0)

## 2019-08-20 LAB — BASIC METABOLIC PANEL
Anion gap: 11 (ref 5–15)
BUN: 35 mg/dL — ABNORMAL HIGH (ref 8–23)
CO2: 23 mmol/L (ref 22–32)
Calcium: 6.3 mg/dL — CL (ref 8.9–10.3)
Chloride: 110 mmol/L (ref 98–111)
Creatinine, Ser: 3.19 mg/dL — ABNORMAL HIGH (ref 0.61–1.24)
GFR calc Af Amer: 21 mL/min — ABNORMAL LOW (ref >60)
GFR calc non Af Amer: 18 mL/min — ABNORMAL LOW (ref >60)
Glucose, Bld: 181 mg/dL — ABNORMAL HIGH (ref 70–99)
Potassium: 3.1 mmol/L — ABNORMAL LOW (ref 3.5–5.1)
Sodium: 144 mmol/L (ref 135–145)

## 2019-08-20 LAB — CBC
HCT: 31.1 % — ABNORMAL LOW (ref 39.0–52.0)
HCT: 25.7 % — ABNORMAL LOW (ref 39.0–52.0)
Hemoglobin: 10.3 g/dL — ABNORMAL LOW (ref 13.0–17.0)
Hemoglobin: 8.4 g/dL — ABNORMAL LOW (ref 13.0–17.0)
MCH: 30.2 pg (ref 26.0–34.0)
MCH: 29.7 pg (ref 26.0–34.0)
MCHC: 33.1 g/dL (ref 30.0–36.0)
MCHC: 32.7 g/dL (ref 30.0–36.0)
MCV: 91.2 fL (ref 80.0–100.0)
MCV: 90.8 fL (ref 80.0–100.0)
Platelets: 123 10*3/uL — ABNORMAL LOW (ref 150–400)
Platelets: 95 10*3/uL — ABNORMAL LOW (ref 150–400)
RBC: 3.41 MIL/uL — ABNORMAL LOW (ref 4.22–5.81)
RBC: 2.83 MIL/uL — ABNORMAL LOW (ref 4.22–5.81)
RDW: 16.6 % — ABNORMAL HIGH (ref 11.5–15.5)
RDW: 17.5 % — ABNORMAL HIGH (ref 11.5–15.5)
WBC: 16 10*3/uL — ABNORMAL HIGH (ref 4.0–10.5)
WBC: 11.1 10*3/uL — ABNORMAL HIGH (ref 4.0–10.5)
nRBC: 1.6 % — ABNORMAL HIGH (ref 0.0–0.2)
nRBC: 1.3 % — ABNORMAL HIGH (ref 0.0–0.2)

## 2019-08-20 LAB — GLUCOSE, CAPILLARY
Glucose-Capillary: 106 mg/dL — ABNORMAL HIGH (ref 70–99)
Glucose-Capillary: 108 mg/dL — ABNORMAL HIGH (ref 70–99)
Glucose-Capillary: 110 mg/dL — ABNORMAL HIGH (ref 70–99)
Glucose-Capillary: 115 mg/dL — ABNORMAL HIGH (ref 70–99)
Glucose-Capillary: 116 mg/dL — ABNORMAL HIGH (ref 70–99)
Glucose-Capillary: 117 mg/dL — ABNORMAL HIGH (ref 70–99)
Glucose-Capillary: 119 mg/dL — ABNORMAL HIGH (ref 70–99)
Glucose-Capillary: 120 mg/dL — ABNORMAL HIGH (ref 70–99)
Glucose-Capillary: 126 mg/dL — ABNORMAL HIGH (ref 70–99)
Glucose-Capillary: 129 mg/dL — ABNORMAL HIGH (ref 70–99)
Glucose-Capillary: 131 mg/dL — ABNORMAL HIGH (ref 70–99)
Glucose-Capillary: 139 mg/dL — ABNORMAL HIGH (ref 70–99)
Glucose-Capillary: 149 mg/dL — ABNORMAL HIGH (ref 70–99)
Glucose-Capillary: 182 mg/dL — ABNORMAL HIGH (ref 70–99)

## 2019-08-20 LAB — COOXEMETRY PANEL
Carboxyhemoglobin: 1.2 % (ref 0.5–1.5)
Methemoglobin: 1.4 % (ref 0.0–1.5)
O2 Saturation: 72.4 %
Total hemoglobin: 8.2 g/dL — ABNORMAL LOW (ref 12.0–16.0)

## 2019-08-20 LAB — TRIGLYCERIDES: Triglycerides: 48 mg/dL (ref <150)

## 2019-08-20 LAB — VANCOMYCIN, RANDOM: Vancomycin Rm: 20

## 2019-08-20 MED ORDER — CALCIUM GLUCONATE-NACL 1-0.675 GM/50ML-% IV SOLN
1.0000 g | Freq: Once | INTRAVENOUS | Status: AC
  Administered 2019-08-20: 1000 mg via INTRAVENOUS
  Filled 2019-08-20: qty 50

## 2019-08-20 MED ORDER — PROPOFOL 1000 MG/100ML IV EMUL
5.0000 ug/kg/min | INTRAVENOUS | Status: DC
  Administered 2019-08-20: 10 ug/kg/min via INTRAVENOUS
  Administered 2019-08-20: 15 ug/kg/min via INTRAVENOUS
  Administered 2019-08-21: 20 ug/kg/min via INTRAVENOUS
  Administered 2019-08-21: 15 ug/kg/min via INTRAVENOUS
  Administered 2019-08-22 (×3): 20 ug/kg/min via INTRAVENOUS
  Administered 2019-08-23 – 2019-08-24 (×4): 15 ug/kg/min via INTRAVENOUS
  Administered 2019-08-24: 20 ug/kg/min via INTRAVENOUS
  Administered 2019-08-24: 15 ug/kg/min via INTRAVENOUS
  Administered 2019-08-25: 25 ug/kg/min via INTRAVENOUS
  Administered 2019-08-25: 2.5 ug/kg/min via INTRAVENOUS
  Administered 2019-08-25: 30 ug/kg/min via INTRAVENOUS
  Administered 2019-08-26: 5 ug/kg/min via INTRAVENOUS
  Filled 2019-08-20: qty 200
  Filled 2019-08-20 (×2): qty 100
  Filled 2019-08-20: qty 200
  Filled 2019-08-20 (×11): qty 100

## 2019-08-20 MED ORDER — FUROSEMIDE 10 MG/ML IJ SOLN
40.0000 mg | Freq: Once | INTRAMUSCULAR | Status: AC
  Administered 2019-08-20: 40 mg via INTRAVENOUS
  Filled 2019-08-20: qty 4

## 2019-08-20 MED ORDER — METOLAZONE 2.5 MG PO TABS
2.5000 mg | ORAL_TABLET | Freq: Once | ORAL | Status: AC
  Administered 2019-08-20: 2.5 mg via ORAL
  Filled 2019-08-20: qty 1

## 2019-08-20 MED ORDER — FUROSEMIDE 10 MG/ML IJ SOLN
12.0000 mg/h | INTRAVENOUS | Status: DC
  Administered 2019-08-20: 6 mg/h via INTRAVENOUS
  Administered 2019-08-21 – 2019-08-22 (×2): 12 mg/h via INTRAVENOUS
  Filled 2019-08-20: qty 21
  Filled 2019-08-20 (×2): qty 25

## 2019-08-20 MED ORDER — POTASSIUM CHLORIDE 10 MEQ/50ML IV SOLN
10.0000 meq | INTRAVENOUS | Status: AC
  Administered 2019-08-20 (×4): 10 meq via INTRAVENOUS
  Filled 2019-08-20: qty 50

## 2019-08-20 MED ORDER — VANCOMYCIN HCL IN DEXTROSE 1-5 GM/200ML-% IV SOLN
1000.0000 mg | Freq: Once | INTRAVENOUS | Status: AC
  Administered 2019-08-20: 1000 mg via INTRAVENOUS

## 2019-08-20 MED ORDER — SODIUM CHLORIDE 0.9 % IV SOLN
1.0000 g | INTRAVENOUS | Status: DC
  Administered 2019-08-21 – 2019-08-23 (×4): 1 g via INTRAVENOUS
  Filled 2019-08-20 (×5): qty 1

## 2019-08-20 MED ORDER — MIDAZOLAM 50MG/50ML (1MG/ML) PREMIX INFUSION
2.0000 mg/h | INTRAVENOUS | Status: DC
  Administered 2019-08-20 – 2019-08-24 (×3): 1 mg/h via INTRAVENOUS
  Administered 2019-08-25 – 2019-08-27 (×3): 2 mg/h via INTRAVENOUS
  Filled 2019-08-20 (×6): qty 50

## 2019-08-20 MED ORDER — INSULIN ASPART 100 UNIT/ML ~~LOC~~ SOLN
0.0000 [IU] | SUBCUTANEOUS | Status: DC
  Administered 2019-08-20: 4 [IU] via SUBCUTANEOUS
  Administered 2019-08-20 – 2019-08-21 (×2): 2 [IU] via SUBCUTANEOUS
  Administered 2019-08-21: 04:00:00 4 [IU] via SUBCUTANEOUS
  Administered 2019-08-21 – 2019-08-22 (×5): 2 [IU] via SUBCUTANEOUS
  Administered 2019-08-23 (×6): 4 [IU] via SUBCUTANEOUS
  Administered 2019-08-24: 2 [IU] via SUBCUTANEOUS
  Administered 2019-08-24: 4 [IU] via SUBCUTANEOUS
  Administered 2019-08-24: 2 [IU] via SUBCUTANEOUS
  Administered 2019-08-24 (×2): 4 [IU] via SUBCUTANEOUS
  Administered 2019-08-24 – 2019-08-25 (×5): 2 [IU] via SUBCUTANEOUS
  Administered 2019-08-25: 20:00:00 4 [IU] via SUBCUTANEOUS
  Administered 2019-08-25: 2 [IU] via SUBCUTANEOUS
  Administered 2019-08-26: 4 [IU] via SUBCUTANEOUS
  Administered 2019-08-26: 2 [IU] via SUBCUTANEOUS
  Administered 2019-08-26 – 2019-08-27 (×6): 4 [IU] via SUBCUTANEOUS
  Administered 2019-08-27 – 2019-08-30 (×13): 2 [IU] via SUBCUTANEOUS
  Administered 2019-08-31 (×2): 4 [IU] via SUBCUTANEOUS
  Administered 2019-08-31: 8 [IU] via SUBCUTANEOUS
  Administered 2019-08-31 (×2): 2 [IU] via SUBCUTANEOUS
  Administered 2019-09-01: 8 [IU] via SUBCUTANEOUS
  Administered 2019-09-01: 2 [IU] via SUBCUTANEOUS
  Administered 2019-09-01 (×2): 8 [IU] via SUBCUTANEOUS
  Administered 2019-09-01: 12 [IU] via SUBCUTANEOUS
  Administered 2019-09-01 (×2): 8 [IU] via SUBCUTANEOUS
  Administered 2019-09-02 (×2): 4 [IU] via SUBCUTANEOUS
  Administered 2019-09-02: 2 [IU] via SUBCUTANEOUS
  Administered 2019-09-02 (×2): 8 [IU] via SUBCUTANEOUS
  Administered 2019-09-03 (×2): 2 [IU] via SUBCUTANEOUS
  Administered 2019-09-03: 4 [IU] via SUBCUTANEOUS
  Administered 2019-09-03: 2 [IU] via SUBCUTANEOUS
  Administered 2019-09-03 – 2019-09-04 (×3): 4 [IU] via SUBCUTANEOUS
  Administered 2019-09-04: 2 [IU] via SUBCUTANEOUS
  Administered 2019-09-04 (×2): 4 [IU] via SUBCUTANEOUS
  Administered 2019-09-04: 2 [IU] via SUBCUTANEOUS
  Administered 2019-09-05: 4 [IU] via SUBCUTANEOUS
  Administered 2019-09-05: 2 [IU] via SUBCUTANEOUS
  Administered 2019-09-05 (×2): 4 [IU] via SUBCUTANEOUS
  Administered 2019-09-05 – 2019-09-07 (×5): 2 [IU] via SUBCUTANEOUS
  Administered 2019-09-07 – 2019-09-08 (×3): 4 [IU] via SUBCUTANEOUS
  Administered 2019-09-08: 2 [IU] via SUBCUTANEOUS
  Administered 2019-09-08 (×2): 4 [IU] via SUBCUTANEOUS
  Administered 2019-09-09 (×2): 2 [IU] via SUBCUTANEOUS
  Administered 2019-09-09 – 2019-09-10 (×2): 4 [IU] via SUBCUTANEOUS
  Administered 2019-09-10 (×3): 2 [IU] via SUBCUTANEOUS
  Administered 2019-09-10: 4 [IU] via SUBCUTANEOUS
  Administered 2019-09-11 (×2): 2 [IU] via SUBCUTANEOUS
  Administered 2019-09-11: 4 [IU] via SUBCUTANEOUS
  Administered 2019-09-11: 2 [IU] via SUBCUTANEOUS
  Administered 2019-09-11: 4 [IU] via SUBCUTANEOUS
  Administered 2019-09-11: 2 [IU] via SUBCUTANEOUS
  Administered 2019-09-12: 8 [IU] via SUBCUTANEOUS
  Administered 2019-09-12: 2 [IU] via SUBCUTANEOUS
  Administered 2019-09-12 – 2019-09-13 (×6): 4 [IU] via SUBCUTANEOUS
  Administered 2019-09-13 (×4): 2 [IU] via SUBCUTANEOUS
  Administered 2019-09-13: 6 [IU] via SUBCUTANEOUS
  Administered 2019-09-14: 8 [IU] via SUBCUTANEOUS
  Administered 2019-09-14: 6 [IU] via SUBCUTANEOUS
  Administered 2019-09-14 – 2019-09-15 (×4): 8 [IU] via SUBCUTANEOUS
  Administered 2019-09-15: 4 [IU] via SUBCUTANEOUS
  Administered 2019-09-15: 2 [IU] via SUBCUTANEOUS
  Administered 2019-09-15: 12 [IU] via SUBCUTANEOUS
  Administered 2019-09-15: 8 [IU] via SUBCUTANEOUS
  Administered 2019-09-15 – 2019-09-16 (×5): 4 [IU] via SUBCUTANEOUS
  Administered 2019-09-16 – 2019-09-17 (×2): 2 [IU] via SUBCUTANEOUS
  Administered 2019-09-17 (×2): 4 [IU] via SUBCUTANEOUS
  Administered 2019-09-17 (×2): 2 [IU] via SUBCUTANEOUS
  Administered 2019-09-18: 8 [IU] via SUBCUTANEOUS

## 2019-08-20 NOTE — Progress Notes (Signed)
Pharmacy Antibiotic Note  Christian [name] is a 74 y.o. male admitted on 08/09/2019 now s/p CABG. Now with open chest after clot removal / Code Blue on 4/23. Pharmacy has been consulted for vancomycin and cefepime dosing.   SCr worsening to 3.19 (est CrCl ~25 mL/min) Vancomycin random level collected after 2 g dose given on 4/23. It is therapeutic at 18.   Plan: Decrease cefepime to 1 g q24h Vancomycin 1 g x 1 today to maintain therapeutic levels. Will reassess SCr and LOT for open chest. May need to repeat levels early next week if continuing therapy.  Height: 5\' 9"  (175.3 cm) Weight: 111.2 kg (245 lb 2.4 oz) IBW/kg (Calculated) : 70.7  Temp (24hrs), Avg:97.8 F (36.6 C), Min:97 F (36.1 C), Max:98.2 F (36.8 C)  Recent Labs  Lab 08/18/19 1500 08/18/19 1532 08/18/19 2002 08/19/19 0300 08/19/19 1026 08/19/19 1820 08/20/19 0351  WBC  --  21.3* 16.0* 15.6*  --  14.4* 11.1*  CREATININE   < >  --  2.91* 2.97* 3.03* 3.52* 3.19*  VANCORANDOM  --   --   --   --   --   --  20   < > = values in this interval not displayed.    Estimated Creatinine Clearance: 25.7 mL/min (A) (by C-G formula based on SCr of 3.19 mg/dL (H)).    No Known Allergies   Vancomycin 4/23 >> Cefepime 4/23 >>  4/22 BCx: ngtd 4/19 MRSA PCR: neg    Vertis Kelch, PharmD, [facility] PGY2 Cardiology Pharmacy Resident Phone [phone] 08/20/2019       9:52 AM  Please check [email or web address] for unit-specific pharmacist phone numbers

## 2019-08-20 NOTE — Progress Notes (Signed)
CRITICAL VALUE ALERT  Critical Value:  Calcium 6.3  Date & Time Notied:  T 0440  Provider Notified: Dr Orvan Seen  Orders Received/Actions taken: Orders received for Calcium Gluconate

## 2019-08-20 NOTE — Progress Notes (Signed)
Patient ID: Christian [name], male   DOB: 07/19/1945, 74 y.o.   MRN: [id number] TCTS Evening Rounds:  Hemodynamically stable on epi 1 with CI 2.2. CVP 13. PA 27/15.  Remains on vent, sedated.  Lasix drip started today and now on 12. Urine output is 75/hr. Creat up tonight.  BMET    Component Value Date/Time   NA 143 08/20/2019 1737   NA 141 06/19/2019 1130   K 4.3 08/20/2019 1737   CL 107 08/20/2019 1737   CO2 23 08/20/2019 1737   GLUCOSE 154 (H) 08/20/2019 1737   BUN 54 (H) 08/20/2019 1737   BUN 21 06/19/2019 1130   CREATININE 4.10 (H) 08/20/2019 1737   CALCIUM 7.8 (L) 08/20/2019 1737   GFRNONAA 14 (L) 08/20/2019 1737   GFRAA 16 (L) 08/20/2019 1737   Chest tube output ok.

## 2019-08-20 NOTE — Progress Notes (Signed)
4 Days Post-Op Procedure(s) (LRB): Chest Closure S?P CABG WITH APPLICATION OF PREVENA  INCISIONAL WOUND VAC (N/A) Subjective: Responding to voice, no complaints Objective: Vital signs in last 24 hours: Temp:  [97 F (36.1 C)-98.4 F (36.9 C)] 98.1 F (36.7 C) (04/25 1030) Pulse Rate:  [81-95] 82 (04/25 1030) Cardiac Rhythm: Normal sinus rhythm (04/24 2000) Resp:  [18-19] 18 (04/25 1030) BP: (118-190)/(63-97) 174/92 (04/25 1000) SpO2:  [99 %-100 %] 100 % (04/25 1030) Arterial Line BP: (113-207)/(55-94) 153/72 (04/25 1030) FiO2 (%):  [50 %] 50 % (04/25 0805) Weight:  [111.2 kg] 111.2 kg (04/25 0439)  Hemodynamic parameters for last 24 hours: PAP: (17-40)/(9-29) 30/19 CVP:  [5 mmHg-23 mmHg] 16 mmHg CO:  [3.7 L/min-5.6 L/min] 4.9 L/min CI:  [1.7 L/min/m2-2.6 L/min/m2] 2.3 L/min/m2  Intake/Output from previous day: 04/24 0701 - 04/25 0700 In: 3450.8 [I.V.:2418.2; NG/GT:440; IV Piggyback:592.6] Out: 867 [Urine:387; Chest Tube:480] Intake/Output this shift: Total I/O In: 353.3 [I.V.:203.3; NG/GT:150] Out: 200 [Urine:90; Chest Tube:110]  General appearance: cooperative and no distress Neurologic: intact Heart: regular rate and rhythm, S1, S2 normal, no murmur, click, rub or gallop Lungs: clear to auscultation bilaterally Abdomen: soft, non-tender; bowel sounds normal; no masses,  no organomegaly Extremities: edema 2+ Wound: dressing intact  Lab Results: Recent Labs    08/19/19 1820 08/19/19 1820 08/20/19 0351 08/20/19 0404  WBC 14.4*  --  11.1*  --   HGB 9.8*   < > 8.4* 9.2*  HCT 28.7*   < > 25.7* 27.0*  PLT 110*  --  95*  --    < > = values in this interval not displayed.   BMET:  Recent Labs    08/19/19 1820 08/19/19 1820 08/20/19 0351 08/20/19 0404  NA 143   < > 144 144  K 3.7   < > 3.1* 3.5  CL 105  --  110  --   CO2 24  --  23  --   GLUCOSE 162*  --  181*  --   BUN 34*  --  35*  --   CREATININE 3.52*  --  3.19*  --   CALCIUM 7.5*  --  6.3*  --    < >  = values in this interval not displayed.    PT/INR:  Recent Labs    08/19/19 1820  LABPROT 16.3*  INR 1.3*   ABG    Component Value Date/Time   PHART 7.513 (H) 08/20/2019 0404   HCO3 27.9 08/20/2019 0404   TCO2 29 08/20/2019 0404   ACIDBASEDEF 5.0 (H) 08/18/2019 2341   O2SAT 100.0 08/20/2019 0404   CBG (last 3)  Recent Labs    08/20/19 0814 08/20/19 0913 08/20/19 0959  GLUCAP 120* 106* 116*    Assessment/Plan: S/P Procedure(s) (LRB): Chest Closure S?P CABG WITH APPLICATION OF PREVENA  INCISIONAL WOUND VAC (N/A) Diuresis Plan OR tomorrow for washout possible closure   LOS: 11 days    [name] 08/20/2019

## 2019-08-20 NOTE — Progress Notes (Signed)
Advanced Heart Failure Rounding Note  PCP-Cardiologist: No primary care provider on file.   Subjective:    08/09/19 Admitted with NSTEM and ? Small RLL PE  08/15/19 CABG x4 on 08/15/19 08/18/19 Cardiac arrest due to tamponade. Chest opened for washout  Remains intubated and sedated but waking up on vent. Sedation increased.   NE and milrinone are off. On epi 1. Co-ox 72%  On vanc/cefipime in setting of open chest  Lasix gtt started today at 6. Making urine.   Creatinine 0.9 -> 1.5 -> 3.0 -> 3.5 -> 3.2  Echo today EF 40 to 45% with some residual pericardial clot.  Swan numbers CVP 17 PA 29/18 (23) Thermo 4.5/2.1 PaPI 0.64  Objective:   Weight Range: 111.2 kg Body mass index is 36.2 kg/m.   Vital Signs:   Temp:  [97.7 F (36.5 C)-98.4 F (36.9 C)] 98.1 F (36.7 C) (04/25 1030) Pulse Rate:  [81-95] 81 (04/25 1222) Resp:  [18-19] 18 (04/25 1222) BP: (118-190)/(65-97) 149/72 (04/25 1222) SpO2:  [99 %-100 %] 100 % (04/25 1222) Arterial Line BP: (113-207)/(55-94) 153/72 (04/25 1030) FiO2 (%):  [50 %] 50 % (04/25 1222) Weight:  [111.2 kg] 111.2 kg (04/25 0439) Last BM Date: 08/13/19  Weight change: Filed Weights   08/18/19 0500 08/19/19 0500 08/20/19 0439  Weight: 105.6 kg 107 kg 111.2 kg    Intake/Output:   Intake/Output Summary (Last 24 hours) at 08/20/2019 1449 Last data filed at 08/20/2019 1000 Gross per 24 hour  Intake 2340.38 ml  Output 770 ml  Net 1570.38 ml      Physical Exam   General:  Intubated. Will arouse to stimuli HEENT: normal + ETT Neck: supple. RIJ swan Carotids 2+ bilat; no bruits. No lymphadenopathy or thryomegaly appreciated. Cor: Chest open. RRR  + CTs Lungs: clear Abdomen: soft, nontender, nondistended. No hepatosplenomegaly. No bruits or masses. Good bowel sounds. Extremities: no cyanosis, clubbing, rash, 2+ edema Neuro: On vent. Sedated. Will arouse to stimuli  Telemetry   NSR 70-80s Personally reviewed  EKG    N/a    Labs    CBC Recent Labs    08/19/19 1820 08/19/19 1820 08/20/19 0351 08/20/19 0404  WBC 14.4*  --  11.1*  --   HGB 9.8*   < > 8.4* 9.2*  HCT 28.7*   < > 25.7* 27.0*  MCV 88.3  --  90.8  --   PLT 110*  --  95*  --    < > = values in this interval not displayed.   Basic Metabolic Panel Recent Labs    08/18/19 1500 08/18/19 1543 08/19/19 0300 08/19/19 0312 08/19/19 1820 08/19/19 1820 08/20/19 0351 08/20/19 0404  NA 138   < > 144   < > 143   < > 144 144  K 3.8   < > 2.9*   < > 3.7   < > 3.1* 3.5  CL 108   < > 105   < > 105  --  110  --   CO2 18*   < > 22   < > 24  --  23  --   GLUCOSE 131*   < > 248*   < > 162*  --  181*  --   BUN 21   < > 26*   < > 34*  --  35*  --   CREATININE 2.27*   < > 2.97*   < > 3.52*  --  3.19*  --  CALCIUM 7.9*   < > 7.2*   < > 7.5*  --  6.3*  --   MG  --   --  1.9  --   --   --   --   --   PHOS 3.3  --   --   --   --   --   --   --    < > = values in this interval not displayed.   Liver Function Tests Recent Labs    08/19/19 0300 08/19/19 1820  AST 1,839* 1,649*  ALT 1,499* 1,491*  ALKPHOS 60 63  BILITOT 0.7 0.7  PROT 4.3* 4.6*  ALBUMIN 2.0* 2.2*   No results for input(s): LIPASE, AMYLASE in the last 72 hours. Cardiac Enzymes No results for input(s): CKTOTAL, CKMB, CKMBINDEX, TROPONINI in the last 72 hours.  BNP: BNP (last 3 results) Recent Labs    08/09/19 1240  BNP 89.1    ProBNP (last 3 results) Recent Labs    11/23/18 1120 08/09/19 0922  PROBNP 18.0 78.0     D-Dimer No results for input(s): DDIMER in the last 72 hours. Hemoglobin A1C No results for input(s): HGBA1C in the last 72 hours. Fasting Lipid Panel Recent Labs    08/20/19 0351  TRIG 48   Thyroid Function Tests No results for input(s): TSH, T4TOTAL, T3FREE, THYROIDAB in the last 72 hours.  Invalid input(s): FREET3  Other results:   Imaging    DG Chest 1 View  Result Date: 08/20/2019 CLINICAL DATA:  History of open art surgery. EXAM:  CHEST  1 VIEW COMPARISON:  08/19/2019 FINDINGS: Endotracheal tube now at approximately 3.8 cm above the carina retracted since the prior exam. LEFT-sided chest tube and presumed mediastinal drain which projects over the medial chest in similar position. RIGHT-sided chest tube also remains in place entering via inferior approach. RIGHT IJ central venous line with Swan-Ganz catheter in stable position in area of the main pulmonary artery. IJ sheath transmitting this catheter. Defibrillator pad projects over the RIGHT chest as before. Gastric tube coursing through in off the field of the radiograph. Cardiomediastinal contours stable enlarged. Mild increased interstitial markings. Retrocardiac opacification, mild and similar to prior study. Changes of coronary revascularization. Visualized skeletal structures without acute process. IMPRESSION: 1. Endotracheal tube now at approximately 3.8 cm above the carina retracted since the prior study. 2. Additional support lines and tubes as described. 3. Low inspiratory volumes with LEFT greater than RIGHT basilar atelectasis. 4. Persistent retrocardiac opacification. Electronically Signed   By: Zetta Bills M.D.   On: 08/20/2019 14:07     Medications:     Scheduled Medications: . sodium chloride   Intravenous Once  . sodium chloride   Intravenous Once  . acetaminophen  1,000 mg Oral Q6H   Or  . acetaminophen (TYLENOL) oral liquid 160 mg/5 mL  1,000 mg Per Tube Q6H  . aspirin  81 mg Per Tube Daily  . bisacodyl  10 mg Oral Daily   Or  . bisacodyl  10 mg Rectal Daily  . chlorhexidine  15 mL Mouth Rinse BID  . chlorhexidine gluconate (MEDLINE KIT)  15 mL Mouth Rinse BID  . Chlorhexidine Gluconate Cloth  6 each Topical Daily  . clopidogrel  75 mg Per Tube Daily  . docusate  100 mg Per Tube BID  . fentaNYL (SUBLIMAZE) injection  25 mcg Intravenous Once  . insulin aspart  0-24 Units Subcutaneous Q4H  . isosorbide dinitrate  20 mg Per Tube TID  .  levalbuterol   0.63 mg Nebulization Q6H  . mouth rinse  15 mL Mouth Rinse q12n4p  . pantoprazole sodium  40 mg Per Tube Daily  . sodium chloride flush  10-40 mL Intracatheter Q12H  . sodium chloride flush  3 mL Intravenous Q12H  . tamsulosin  0.4 mg Oral QPC breakfast  . trospium  20 mg Per Tube BID  . vancomycin variable dose per unstable renal function (pharmacist dosing)   Does not apply See admin instructions    Infusions: . sodium chloride Stopped (08/16/19 1631)  . sodium chloride    . sodium chloride    . sodium chloride 20 mL/hr at 08/20/19 1000  . amiodarone 30 mg/hr (08/20/19 1000)  . [START ON 08/21/2019] ceFEPime (MAXIPIME) IV    . epinephrine 1 mcg/min (08/20/19 1000)  . fentaNYL infusion INTRAVENOUS 210 mcg/hr (08/20/19 1217)  . furosemide (LASIX) infusion 12 mg/hr (08/20/19 1423)  . insulin 1.4 mL/hr at 08/20/19 1000  . lactated ringers    . lactated ringers    . lactated ringers    . lactated ringers 20 mL/hr at 08/16/19 1600  . magnesium sulfate bolus IVPB    . midazolam 1 mg/hr (08/20/19 1219)  . milrinone Stopped (08/18/19 1859)  . norepinephrine (LEVOPHED) Adult infusion Stopped (08/19/19 1921)  . propofol (DIPRIVAN) infusion 10 mcg/kg/min (08/20/19 1143)  . sodium bicarbonate 150 mEq in dextrose 5% 1000 mL Stopped (08/19/19 0707)  . vancomycin      PRN Medications: sodium chloride, cromolyn, dextrose, fentaNYL, hydrALAZINE, lactated ringers, midazolam, morphine injection, ondansetron (ZOFRAN) IV, oxyCODONE, sodium chloride flush, sodium chloride flush, traMADol     Assessment/Plan   1. CAD:  - s/p PDA angioplasty + stent in 02/2014 - Admitted for NSTEMI 4/14. Hs Trop peaked at [place] w/ severe 3V CAD. Echo w/ reduced EF 30-35%. RV ok. - S/p CABG x 4 on 4/20 (LIMA-LAD, RIMA to PDA, Lt radial artery graft to 1st OM and distal OM branches of LCx) - Underwent urgent sternotomy at the bedside for cardiac tamponade and removal of clot on 4/23 - Remains intubated -  continue ASA. Off statin due to shock liver. Can likely restart soon  - Radial harvest. On isordil.   2. Acute Systolic Heart Failure/ Ischemic Cardiomyopathy -> cardiogenic shock - LVEF severely reduced at 30-35%, in the setting of NSTEM/ Multivessel CAD. RV ok. - Underwent urgent sternotomy at the bedside for cardiac tamponade and removal of clot on 4/23 - Chest remains open NE/milrinone are off. On EPI at 1.  Luiz Blare numbers with CI 2.1 and PAPi 0.7 suggestive of severe RV failure. Continue epi. May need to increase slightly.  - Plan to return to the OR on Monday for repeat washout and possible re-closure however will need more fluid off first. Weight up 35 pounds from baseline - Increase lasix gtt to 12. Add metolazone 2.5 - Echo 4/24EF 40 to 45% inotropic support.  Small amount of residual pericardial clot.  RV moderately hypokinetic - Continue supportive care.    3.  Postoperative acute hypoxic respiratory failure - Reintubated on 4/23 due to tach arrest and worsening cardiogenic shock. - Keep intubated/sedated while chest open. Regimen adjusted today - CCM following.  4. AKI on Stage III CKD - baseline SCr 0.9 - Creatinine peaked at 3.5 due to ATN. Now back down to 3.0. Making urine -Continue supportive care. Hopefully can avoid CVVHD  5.  Shock liver - Due to cardiac arrest on 4/23 - LFTs up.  Will hold statin for now and follow.  6. 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  - Continue amio drip.  - Off heparin currently with open chest and recent tamponade, may be able to restart soon low-dose  7.  Possible small right lower lobe PE: - Chest CT 4/14 w/ small RLL PE - Echo shows normal RV. No strain - Bilateral Venous Dopplers negative for DVT - Off heparin currently with open chest and recent tamponade, may be able to restart soon low-dose - Anticipate switching eliquis prior to d/c.   8. T2DM - Hgb A1c 7.7 -Remains on insulin  CRITICAL  CARE Performed by: Glori Bickers  Total critical care time: 40 minutes  Critical care time was exclusive of separately billable procedures and treating other patients.  Critical care was necessary to treat or prevent imminent or life-threatening deterioration.  Critical care was time spent personally by me (independent of midlevel providers or residents) on the following activities: development of treatment plan with patient and/or surrogate as well as nursing, discussions with consultants, evaluation of patient's response to treatment, examination of patient, obtaining history from patient or surrogate, ordering and performing treatments and interventions, ordering and review of laboratory studies, ordering and review of radiographic studies, pulse oximetry and re-evaluation of patient's condition.    Length of Stay: [place], MD  08/20/2019, 2:49 PM  Advanced Heart Failure Team Pager [phone] (M-F; 7a - 4p)  Please contact [facility] for night-coverage after hours (4p -7a ) and weekends on [email or web address]

## 2019-08-21 ENCOUNTER — Inpatient Hospital Stay (HOSPITAL_COMMUNITY): Payer: Medicare Other | Admitting: Certified Registered Nurse Anesthetist

## 2019-08-21 ENCOUNTER — Encounter (HOSPITAL_COMMUNITY)
Admission: EM | Disposition: A | Discharge status: Skilled Nursing Facility | Payer: Self-pay | Source: Home / Self Care | Attending: Cardiothoracic Surgery

## 2019-08-21 DIAGNOSIS — I50810 Right heart failure, unspecified: Secondary | ICD-10-CM

## 2019-08-21 DIAGNOSIS — I9789 Other postprocedural complications and disorders of the circulatory system, not elsewhere classified: Secondary | ICD-10-CM

## 2019-08-21 DIAGNOSIS — N179 Acute kidney failure, unspecified: Secondary | ICD-10-CM | POA: Diagnosis not present

## 2019-08-21 DIAGNOSIS — I214 Non-ST elevation (NSTEMI) myocardial infarction: Secondary | ICD-10-CM | POA: Diagnosis not present

## 2019-08-21 HISTORY — PX: EXPLORATION POST OPERATIVE OPEN HEART: SHX5061

## 2019-08-21 LAB — CBC
HCT: 26.5 % — ABNORMAL LOW (ref 39.0–52.0)
Hemoglobin: 8.7 g/dL — ABNORMAL LOW (ref 13.0–17.0)
MCH: 30.5 pg (ref 26.0–34.0)
MCHC: 32.8 g/dL (ref 30.0–36.0)
MCV: 93 fL (ref 80.0–100.0)
Platelets: 133 10*3/uL — ABNORMAL LOW (ref 150–400)
RBC: 2.85 MIL/uL — ABNORMAL LOW (ref 4.22–5.81)
RDW: 17.2 % — ABNORMAL HIGH (ref 11.5–15.5)
WBC: 10.4 10*3/uL (ref 4.0–10.5)
nRBC: 1.7 % — ABNORMAL HIGH (ref 0.0–0.2)

## 2019-08-21 LAB — POCT I-STAT 7, (LYTES, BLD GAS, ICA,H+H)
Acid-base deficit: 2 mmol/L (ref 0.0–2.0)
Bicarbonate: 23 mmol/L (ref 20.0–28.0)
Calcium, Ion: 1.19 mmol/L (ref 1.15–1.40)
HCT: 31 % — ABNORMAL LOW (ref 39.0–52.0)
Hemoglobin: 10.5 g/dL — ABNORMAL LOW (ref 13.0–17.0)
O2 Saturation: 94 %
Patient temperature: 36.4
Potassium: 3.1 mmol/L — ABNORMAL LOW (ref 3.5–5.1)
Sodium: 139 mmol/L (ref 135–145)
TCO2: 24 mmol/L (ref 22–32)
pCO2 arterial: 36.8 mmHg (ref 32.0–48.0)
pH, Arterial: 7.4 (ref 7.350–7.450)
pO2, Arterial: 67 mmHg — ABNORMAL LOW (ref 83.0–108.0)

## 2019-08-21 LAB — GLUCOSE, CAPILLARY
Glucose-Capillary: 116 mg/dL — ABNORMAL HIGH (ref 70–99)
Glucose-Capillary: 119 mg/dL — ABNORMAL HIGH (ref 70–99)
Glucose-Capillary: 124 mg/dL — ABNORMAL HIGH (ref 70–99)
Glucose-Capillary: 138 mg/dL — ABNORMAL HIGH (ref 70–99)
Glucose-Capillary: 141 mg/dL — ABNORMAL HIGH (ref 70–99)
Glucose-Capillary: 172 mg/dL — ABNORMAL HIGH (ref 70–99)

## 2019-08-21 LAB — TRIGLYCERIDES
Triglycerides: 146 mg/dL (ref <150)
Triglycerides: 199 mg/dL — ABNORMAL HIGH (ref <150)

## 2019-08-21 LAB — COOXEMETRY PANEL
Carboxyhemoglobin: 1.1 % (ref 0.5–1.5)
Carboxyhemoglobin: 0.9 % (ref 0.5–1.5)
Methemoglobin: 1.4 % (ref 0.0–1.5)
Methemoglobin: 1 % (ref 0.0–1.5)
O2 Saturation: 63.9 %
O2 Saturation: 66.2 %
Total hemoglobin: 10.4 g/dL — ABNORMAL LOW (ref 12.0–16.0)
Total hemoglobin: 13 g/dL (ref 12.0–16.0)

## 2019-08-21 LAB — BASIC METABOLIC PANEL
Anion gap: 11 (ref 5–15)
BUN: 60 mg/dL — ABNORMAL HIGH (ref 8–23)
CO2: 24 mmol/L (ref 22–32)
Calcium: 7.6 mg/dL — ABNORMAL LOW (ref 8.9–10.3)
Chloride: 106 mmol/L (ref 98–111)
Creatinine, Ser: 4.1 mg/dL — ABNORMAL HIGH (ref 0.61–1.24)
GFR calc Af Amer: 16 mL/min — ABNORMAL LOW (ref >60)
GFR calc non Af Amer: 14 mL/min — ABNORMAL LOW (ref >60)
Glucose, Bld: 216 mg/dL — ABNORMAL HIGH (ref 70–99)
Potassium: 4 mmol/L (ref 3.5–5.1)
Sodium: 141 mmol/L (ref 135–145)

## 2019-08-21 LAB — PHOSPHORUS: Phosphorus: 5.6 mg/dL — ABNORMAL HIGH (ref 2.5–4.6)

## 2019-08-21 LAB — MAGNESIUM: Magnesium: 2.2 mg/dL (ref 1.7–2.4)

## 2019-08-21 SURGERY — EXPLORATION POST OPERATIVE OPEN HEART
Anesthesia: General | Site: Chest

## 2019-08-21 MED ORDER — MIDAZOLAM HCL 2 MG/2ML IJ SOLN
INTRAMUSCULAR | Status: AC
  Filled 2019-08-21: qty 2

## 2019-08-21 MED ORDER — EPINEPHRINE 1 MG/10ML IJ SOSY
PREFILLED_SYRINGE | INTRAMUSCULAR | Status: AC
  Filled 2019-08-21: qty 10

## 2019-08-21 MED ORDER — FENTANYL CITRATE (PF) 250 MCG/5ML IJ SOLN
INTRAMUSCULAR | Status: AC
  Filled 2019-08-21: qty 5

## 2019-08-21 MED ORDER — FENTANYL CITRATE (PF) 100 MCG/2ML IJ SOLN
INTRAMUSCULAR | Status: DC | PRN
  Administered 2019-08-21: 50 ug via INTRAVENOUS

## 2019-08-21 MED ORDER — ORAL CARE MOUTH RINSE
15.0000 mL | OROMUCOSAL | Status: DC
  Administered 2019-08-21 – 2019-09-04 (×123): 15 mL via OROMUCOSAL

## 2019-08-21 MED ORDER — MILRINONE LACTATE IN DEXTROSE 20-5 MG/100ML-% IV SOLN
0.2500 ug/kg/min | INTRAVENOUS | Status: DC
  Administered 2019-08-21 – 2019-08-22 (×3): 0.25 ug/kg/min via INTRAVENOUS
  Filled 2019-08-21 (×4): qty 100

## 2019-08-21 MED ORDER — VITAL AF 1.2 CAL PO LIQD
1000.0000 mL | ORAL | Status: DC
  Administered 2019-08-21: 1000 mL

## 2019-08-21 MED ORDER — ROCURONIUM BROMIDE 10 MG/ML (PF) SYRINGE
PREFILLED_SYRINGE | INTRAVENOUS | Status: AC
  Filled 2019-08-21: qty 10

## 2019-08-21 MED ORDER — VANCOMYCIN HCL 1000 MG IV SOLR
INTRAVENOUS | Status: AC
  Filled 2019-08-21: qty 1000

## 2019-08-21 MED ORDER — HEMOSTATIC AGENTS (NO CHARGE) OPTIME
TOPICAL | Status: DC | PRN
  Administered 2019-08-21: 1 via TOPICAL

## 2019-08-21 MED ORDER — LACTATED RINGERS IV SOLN: INTRAVENOUS | Status: DC | PRN

## 2019-08-21 MED ORDER — PHENYLEPHRINE 40 MCG/ML (10ML) SYRINGE FOR IV PUSH (FOR BLOOD PRESSURE SUPPORT)
PREFILLED_SYRINGE | INTRAVENOUS | Status: AC
  Filled 2019-08-21: qty 10

## 2019-08-21 MED ORDER — CHLORHEXIDINE GLUCONATE 0.12% ORAL RINSE (MEDLINE KIT)
15.0000 mL | OROMUCOSAL | Status: DC
  Administered 2019-08-21 (×2): 15 mL via OROMUCOSAL

## 2019-08-21 MED ORDER — 0.9 % SODIUM CHLORIDE (POUR BTL) OPTIME
TOPICAL | Status: DC | PRN
  Administered 2019-08-21: 15:00:00 2000 mL
  Administered 2019-08-21: 16:00:00 1000 mL

## 2019-08-21 MED ORDER — CHLORHEXIDINE GLUCONATE 0.12% ORAL RINSE (MEDLINE KIT)
15.0000 mL | Freq: Two times a day (BID) | OROMUCOSAL | Status: DC
  Administered 2019-08-21 – 2019-09-03 (×26): 15 mL via OROMUCOSAL

## 2019-08-21 MED ORDER — PRO-STAT SUGAR FREE PO LIQD
30.0000 mL | Freq: Four times a day (QID) | ORAL | Status: DC
  Administered 2019-08-21 – 2019-08-28 (×23): 30 mL
  Filled 2019-08-21 (×22): qty 30

## 2019-08-21 MED ORDER — ROCURONIUM BROMIDE 100 MG/10ML IV SOLN
INTRAVENOUS | Status: DC | PRN
  Administered 2019-08-21: 30 mg via INTRAVENOUS

## 2019-08-21 SURGICAL SUPPLY — 72 items
BAG DECANTER FOR FLEXI CONT (MISCELLANEOUS) IMPLANT
BANDAGE ESMARK 6X9 LF (GAUZE/BANDAGES/DRESSINGS) ×1 IMPLANT
BENZOIN TINCTURE PRP APPL 2/3 (GAUZE/BANDAGES/DRESSINGS) ×4 IMPLANT
BNDG ELASTIC 4X5.8 VLCR STR LF (GAUZE/BANDAGES/DRESSINGS) IMPLANT
BNDG ELASTIC 6X5.8 VLCR STR LF (GAUZE/BANDAGES/DRESSINGS) IMPLANT
BNDG ESMARK 6X9 LF (GAUZE/BANDAGES/DRESSINGS) ×2
CABLE PACING FASLOC BLUE (MISCELLANEOUS) ×2 IMPLANT
CANISTER SUCT 3000ML PPV (MISCELLANEOUS) ×2 IMPLANT
CATH CPB KIT HENDRICKSON (MISCELLANEOUS) IMPLANT
CATH ROBINSON RED A/P 18FR (CATHETERS) IMPLANT
DERMABOND ADVANCED (GAUZE/BANDAGES/DRESSINGS)
DERMABOND ADVANCED .7 DNX12 (GAUZE/BANDAGES/DRESSINGS) IMPLANT
DRAIN CHANNEL 28F RND 3/8 FF (WOUND CARE) IMPLANT
DRAPE CARDIOVASCULAR INCISE (DRAPES) ×2
DRAPE SLUSH MACHINE 52X66 (DRAPES) ×2 IMPLANT
DRAPE SLUSH/WARMER DISC (DRAPES) IMPLANT
DRAPE SRG 135X102X78XABS (DRAPES) ×1 IMPLANT
DRAPE UNIVERSAL PACK (DRAPES) ×2 IMPLANT
DRSG AQUACEL AG ADV 3.5X14 (GAUZE/BANDAGES/DRESSINGS) IMPLANT
ELECT CAUTERY BLADE 6.4 (BLADE) IMPLANT
ELECT REM PT RETURN 9FT ADLT (ELECTROSURGICAL) ×4
ELECTRODE REM PT RTRN 9FT ADLT (ELECTROSURGICAL) ×2 IMPLANT
FELT TEFLON 1X6 (MISCELLANEOUS) IMPLANT
GAUZE SPONGE 4X4 12PLY STRL (GAUZE/BANDAGES/DRESSINGS) ×2 IMPLANT
GAUZE SPONGE 4X4 12PLY STRL LF (GAUZE/BANDAGES/DRESSINGS) ×2 IMPLANT
GLOVE BIO SURGEON STRL SZ 6.5 (GLOVE) ×2 IMPLANT
GLOVE BIOGEL PI IND STRL 6.5 (GLOVE) ×1 IMPLANT
GLOVE BIOGEL PI INDICATOR 6.5 (GLOVE) ×1
GLOVE NEODERM STRL 7.5  LF PF (GLOVE) ×1
GLOVE NEODERM STRL 7.5 LF PF (GLOVE) ×1 IMPLANT
GLOVE SURG NEODERM 7.5  LF PF (GLOVE) ×1
GOWN STRL REUS W/ TWL LRG LVL3 (GOWN DISPOSABLE) ×3 IMPLANT
GOWN STRL REUS W/TWL LRG LVL3 (GOWN DISPOSABLE) ×6
HEMOSTAT POWDER SURGIFOAM 1G (HEMOSTASIS) IMPLANT
HEMOSTAT SURGICEL 2X14 (HEMOSTASIS) IMPLANT
KIT BASIN OR (CUSTOM PROCEDURE TRAY) ×2 IMPLANT
KIT SUCTION CATH 14FR (SUCTIONS) IMPLANT
KIT TURNOVER KIT B (KITS) ×2 IMPLANT
NS IRRIG 1000ML POUR BTL (IV SOLUTION) ×6 IMPLANT
PACK CHEST (CUSTOM PROCEDURE TRAY) ×2 IMPLANT
PACK E OPEN HEART (SUTURE) IMPLANT
PACK OPEN HEART (CUSTOM PROCEDURE TRAY) IMPLANT
PAD ARMBOARD 7.5X6 YLW CONV (MISCELLANEOUS) ×4 IMPLANT
PAD ELECT DEFIB RADIOL ZOLL (MISCELLANEOUS) ×2 IMPLANT
POSITIONER HEAD DONUT 9IN (MISCELLANEOUS) ×2 IMPLANT
SEALANT PATCH FIBRIN 2X4IN (MISCELLANEOUS) ×2 IMPLANT
SUT BONE WAX W31G (SUTURE) IMPLANT
SUT MNCRL AB 3-0 PS2 18 (SUTURE) IMPLANT
SUT PDS AB 1 CTX 36 (SUTURE) IMPLANT
SUT PROLENE 2 0 MH 48 (SUTURE) ×6 IMPLANT
SUT PROLENE 3 0 SH DA (SUTURE) IMPLANT
SUT PROLENE 5 0 C 1 36 (SUTURE) IMPLANT
SUT PROLENE 6 0 C 1 30 (SUTURE) ×2 IMPLANT
SUT PROLENE 8 0 BV175 6 (SUTURE) IMPLANT
SUT PROLENE BLUE 7 0 (SUTURE) IMPLANT
SUT SILK 2 0 SH CR/8 (SUTURE) ×2 IMPLANT
SUT SILK 3 0 SH CR/8 (SUTURE) IMPLANT
SUT STEEL 6MS V (SUTURE) IMPLANT
SUT STEEL SZ 6 DBL 3X14 BALL (SUTURE) IMPLANT
SUT TEM PAC WIRE 2 0 SH (SUTURE) ×4 IMPLANT
SUT VIC AB 2-0 CTX 27 (SUTURE) IMPLANT
SUT VIC AB 3-0 X1 27 (SUTURE) IMPLANT
SYR 20ML LL LF (SYRINGE) ×2 IMPLANT
SYSTEM SAHARA CHEST DRAIN ATS (WOUND CARE) IMPLANT
TAPE CLOTH SURG 4X10 WHT LF (GAUZE/BANDAGES/DRESSINGS) ×2 IMPLANT
TAPE PAPER 2X10 WHT MICROPORE (GAUZE/BANDAGES/DRESSINGS) ×2 IMPLANT
TOWEL GREEN STERILE (TOWEL DISPOSABLE) ×2 IMPLANT
TOWEL GREEN STERILE FF (TOWEL DISPOSABLE) IMPLANT
TRAY FOLEY SLVR 16FR TEMP STAT (SET/KITS/TRAYS/PACK) IMPLANT
UNDERPAD 30X30 (UNDERPADS AND DIAPERS) IMPLANT
WATER STERILE IRR 1000ML POUR (IV SOLUTION) ×4 IMPLANT
WATER STERILE IRR 1000ML UROMA (IV SOLUTION) IMPLANT

## 2019-08-21 NOTE — Anesthesia Postprocedure Evaluation (Signed)
Anesthesia Post Note  Patient: Christian [name]  Procedure(s) Performed: CHEST WASHOUT S/P OPEN CHEST (N/A Chest)     Patient location during evaluation: SICU Anesthesia Type: General Level of consciousness: sedated Pain management: pain level controlled Vital Signs Assessment: post-procedure vital signs reviewed and stable Respiratory status: patient remains intubated per anesthesia plan Cardiovascular status: stable Postop Assessment: no apparent nausea or vomiting Anesthetic complications: no    Last Vitals:  Vitals:   08/21/19 1300 08/21/19 1400  BP: 130/65 131/63  Pulse: 79 80  Resp: 18 18  Temp: (!) 36.2 C (!) 36.1 C  SpO2: 99% 100%    Last Pain:  Vitals:   08/20/19 2000  TempSrc: Core  PainSc:                  Hanley Rispoli,W. EDMOND

## 2019-08-21 NOTE — H&P (Signed)
History and Physical Interval Note:  08/21/2019 2:57 PM  Christian [name]  has presented today for surgery, with the diagnosis of OPEN CHEST.  The various methods of treatment have been discussed with the patient and family. After consideration of risks, benefits and other options for treatment, the patient has consented to  Procedure(s): CHEST WASHOUT S/P OPEN CHEST (N/A) as a surgical intervention.  The patient's history has been reviewed, patient examined, no change in status, stable for surgery.  I have reviewed the patient's chart and labs.  Questions were answered to the patient's satisfaction.     [name]

## 2019-08-21 NOTE — Progress Notes (Addendum)
Advanced Heart Failure Rounding Note  PCP-Cardiologist: No primary care provider on file.   Subjective:    08/09/19 Admitted with NSTEM and ? Small RLL PE. Weight 213 pounds.  08/15/19 CABG x4 on 08/15/19 08/18/19 Cardiac arrest due to tamponade. Chest opened for washout  Waking up this morning. Sedation increased.   Remains on epi  1 mcg + amio 30 mg per hour + lasix drip 12 mg.  CO-OX 64%   On vanc/cefipime in setting of open chest  Creatinine 0.9 -> 1.5 -> 3.0 -> 3.5 -> 3.2->4.1->4.1 . Making > 2 liters of urine.  Echo 4/25 EF 40 to 45% with some residual pericardial clot.  Swan numbers CVP 11-12 PA 25/18  Thermo 4.1/1.9  PAPi 0.7 Co-ox 64%   Objective:   Weight Range: 112 kg Body mass index is 36.46 kg/m.   Vital Signs:   Temp:  [97.7 F (36.5 C)-98.4 F (36.9 C)] 98.1 F (36.7 C) (04/26 0700) Pulse Rate:  [71-93] 74 (04/26 0700) Resp:  [17-20] 18 (04/26 0700) BP: (117-190)/(62-97) 143/76 (04/26 0600) SpO2:  [99 %-100 %] 100 % (04/26 0700) Arterial Line BP: (118-207)/(61-94) 165/72 (04/26 0700) FiO2 (%):  [50 %] 50 % (04/26 0326) Weight:  [335 kg] 112 kg (04/26 0400) Last BM Date: 08/13/19  Weight change: Filed Weights   08/19/19 0500 08/20/19 0439 08/21/19 0400  Weight: 107 kg 111.2 kg 112 kg    Intake/Output:   Intake/Output Summary (Last 24 hours) at 08/21/2019 0744 Last data filed at 08/21/2019 0700 Gross per 24 hour  Intake 2485.93 ml  Output 3465 ml  Net -979.07 ml      Physical Exam  CVP 11-12  General:  Sedated/intubated. No resp difficulty HEENT: ETT Neck: supple. JVP difficult to assess with lines. Carotids 2+ bilat; no bruits. No lymphadenopathy or thryomegaly appreciated. RIJ swan  Cor: PMI nondisplaced. Regular rate & rhythm. No rubs, gallops or murmurs. Chest open with dressing in place.  Lungs: Coarse throughout CTx1 Abdomen: soft, nontender, nondistended. No hepatosplenomegaly. No bruits or masses. Good bowel  sounds. Extremities: no cyanosis, clubbing, rash, R and LLE 2+ compression stockings in place.  Neuro:Intubated/sedated   Telemetry   NSR 70-80s personally reviewed.   EKG    N/a   Labs    CBC Recent Labs    08/20/19 0351 08/20/19 0351 08/20/19 0404 08/21/19 0406  WBC 11.1*  --   --  10.4  HGB 8.4*   < > 9.2* 8.7*  HCT 25.7*   < > 27.0* 26.5*  MCV 90.8  --   --  93.0  PLT 95*  --   --  133*   < > = values in this interval not displayed.   Basic Metabolic Panel Recent Labs    08/18/19 1500 08/18/19 1543 08/19/19 0300 08/19/19 0312 08/20/19 1737 08/21/19 0406  NA 138   < > 144   < > 143 141  K 3.8   < > 2.9*   < > 4.3 4.0  CL 108   < > 105   < > 107 106  CO2 18*   < > 22   < > 23 24  GLUCOSE 131*   < > 248*   < > 154* 216*  BUN 21   < > 26*   < > 54* 60*  CREATININE 2.27*   < > 2.97*   < > 4.10* 4.10*  CALCIUM 7.9*   < > 7.2*   < > 7.8*  7.6*  MG  --   --  1.9  --   --  2.2  PHOS 3.3  --   --   --   --   --    < > = values in this interval not displayed.   Liver Function Tests Recent Labs    08/19/19 1820 08/20/19 1737  AST 1,649* 424*  ALT 1,491* 868*  ALKPHOS 63 82  BILITOT 0.7 0.5  PROT 4.6* 4.5*  ALBUMIN 2.2* 2.2*   No results for input(s): LIPASE, AMYLASE in the last 72 hours. Cardiac Enzymes No results for input(s): CKTOTAL, CKMB, CKMBINDEX, TROPONINI in the last 72 hours.  BNP: BNP (last 3 results) Recent Labs    08/09/19 1240  BNP 89.1    ProBNP (last 3 results) Recent Labs    11/23/18 1120 08/09/19 0922  PROBNP 18.0 78.0     D-Dimer No results for input(s): DDIMER in the last 72 hours. Hemoglobin A1C No results for input(s): HGBA1C in the last 72 hours. Fasting Lipid Panel Recent Labs    08/20/19 0351  TRIG 48   Thyroid Function Tests No results for input(s): TSH, T4TOTAL, T3FREE, THYROIDAB in the last 72 hours.  Invalid input(s): FREET3  Other results:   Imaging    DG Chest 1 View  Result Date:  08/20/2019 CLINICAL DATA:  History of open art surgery. EXAM: CHEST  1 VIEW COMPARISON:  08/19/2019 FINDINGS: Endotracheal tube now at approximately 3.8 cm above the carina retracted since the prior exam. LEFT-sided chest tube and presumed mediastinal drain which projects over the medial chest in similar position. RIGHT-sided chest tube also remains in place entering via inferior approach. RIGHT IJ central venous line with Swan-Ganz catheter in stable position in area of the main pulmonary artery. IJ sheath transmitting this catheter. Defibrillator pad projects over the RIGHT chest as before. Gastric tube coursing through in off the field of the radiograph. Cardiomediastinal contours stable enlarged. Mild increased interstitial markings. Retrocardiac opacification, mild and similar to prior study. Changes of coronary revascularization. Visualized skeletal structures without acute process. IMPRESSION: 1. Endotracheal tube now at approximately 3.8 cm above the carina retracted since the prior study. 2. Additional support lines and tubes as described. 3. Low inspiratory volumes with LEFT greater than RIGHT basilar atelectasis. 4. Persistent retrocardiac opacification. Electronically Signed   By: Zetta Bills M.D.   On: 08/20/2019 14:07     Medications:     Scheduled Medications: . sodium chloride   Intravenous Once  . sodium chloride   Intravenous Once  . aspirin  81 mg Per Tube Daily  . bisacodyl  10 mg Oral Daily   Or  . bisacodyl  10 mg Rectal Daily  . chlorhexidine gluconate (MEDLINE KIT)  15 mL Mouth Rinse Q2H  . Chlorhexidine Gluconate Cloth  6 each Topical Daily  . clopidogrel  75 mg Per Tube Daily  . docusate  100 mg Per Tube BID  . fentaNYL (SUBLIMAZE) injection  25 mcg Intravenous Once  . insulin aspart  0-24 Units Subcutaneous Q4H  . isosorbide dinitrate  20 mg Per Tube TID  . mouth rinse  15 mL Mouth Rinse q12n4p  . pantoprazole sodium  40 mg Per Tube Daily  . sodium chloride flush   10-40 mL Intracatheter Q12H  . sodium chloride flush  3 mL Intravenous Q12H  . tamsulosin  0.4 mg Oral QPC breakfast  . trospium  20 mg Per Tube BID  . vancomycin variable dose per unstable renal  function (pharmacist dosing)   Does not apply See admin instructions    Infusions: . sodium chloride Stopped (08/16/19 1631)  . sodium chloride    . sodium chloride    . sodium chloride 20 mL/hr at 08/21/19 0700  . amiodarone 30 mg/hr (08/21/19 0731)  . ceFEPime (MAXIPIME) IV Stopped (08/21/19 0132)  . epinephrine 1 mcg/min (08/20/19 1602)  . fentaNYL infusion INTRAVENOUS 200 mcg/hr (08/21/19 0700)  . furosemide (LASIX) infusion 12 mg/hr (08/21/19 0700)  . insulin Stopped (08/20/19 1148)  . lactated ringers    . lactated ringers    . lactated ringers    . lactated ringers 20 mL/hr at 08/20/19 1200  . magnesium sulfate bolus IVPB    . midazolam 1 mg/hr (08/21/19 0700)  . milrinone Stopped (08/18/19 1859)  . norepinephrine (LEVOPHED) Adult infusion Stopped (08/19/19 1921)  . propofol (DIPRIVAN) infusion 15 mcg/kg/min (08/21/19 0730)  . sodium bicarbonate 150 mEq in dextrose 5% 1000 mL Stopped (08/19/19 0707)    PRN Medications: sodium chloride, cromolyn, dextrose, fentaNYL, hydrALAZINE, lactated ringers, midazolam, morphine injection, ondansetron (ZOFRAN) IV, oxyCODONE, sodium chloride flush, sodium chloride flush, traMADol     Assessment/Plan   1. CAD:  - s/p PDA angioplasty + stent in 02/2014 - Admitted for NSTEMI 4/14. Hs Trop peaked at [place] w/ severe 3V CAD. Echo w/ reduced EF 30-35%. RV ok. - S/p CABG x 4 on 4/20 (LIMA-LAD, RIMA to PDA, Lt radial artery graft to 1st OM and distal OM branches of LCx) - Chest reopened urgently at the bedside for cardiac tamponade and removal of clot on 4/23 - Remains intubated.  - Possible return to OR today.  - continue ASA/Plavix. Off statin due to shock liver.  - Radial harvest. On isordil 20 tid.   2. Acute Systolic Heart Failure/  Ischemic Cardiomyopathy -> cardiogenic shock - LVEF severely reduced at 30-35%, in the setting of NSTEM/ Multivessel CAD. RV ok. - Underwent urgent sternotomy at the bedside for cardiac tamponade and removal of clot on 4/23 - Chest remains open NE/milrinone are off. On EPI at 1.  Luiz Blare numbers with CI 1.9 and PAPi 0.7 suggestive of severe RV failure. Continue epi. May need to increase slightly.  - Plan to return to the OR today - CVP 11-12. Continue lasix drip 12 mg per hour.  - Echo 4/24 EF 40 to 45% with inotropic support.  Small amount of residual pericardial clot.  RV moderately hypokinetic - Continue supportive care.    3.  Postoperative acute hypoxic respiratory failure - Reintubated on 4/23 due to tach arrest and worsening cardiogenic shock. - Keep intubated/sedated while chest open.  - CCM following.  4. AKI on Stage III CKD - baseline SCr 0.9 - Creatinine peaked 4.1 due to ATN.  - Making  ~ 2 liters urine.  -Continue supportive care. Hopefully can avoid CVVHD  5.  Shock liver - Due to cardiac arrest on 4/23 - statin held, repeat LFTs in am and restart statin if improving.   6. 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  - Continue amio drip.  - Off heparin currently with open chest and recent tamponade, will need to address long-term plan.  If we are going to anticoagulate, will need to stop Plavix.   7.  Possible small right lower lobe PE: - Chest CT 4/14 w/ small RLL PE - Echo shows normal RV. No strain - Bilateral Venous Dopplers negative for DVT - Off heparin  currently with open chest and recent tamponade, may be able to restart soon low-dose - Anticipate switching eliquis prior to d/c.   8. T2DM - Hgb A1c 7.7 -Remains on insulin  9. Pericardial tamponade - 4/23 return to OR for clot removal, chest wall left open. Patient had been on heparin gtt for DCCV of atrial flutter.   Length of Stay: Reader, NP  08/21/2019, 7:44  AM  Advanced Heart Failure Team Pager [phone] (M-F; 7a - 4p)  Please contact [facility] for night-coverage after hours (4p -7a ) and weekends on [email or web address]  Patient seen with NP, agree with the above note.   Patient remains on epinephrine 1 with SBP in 160s.  He is on Lasix gtt at 10 mg/hr and in NSR on amiodarone gtt.  CVP 11-12.  Creatinine 1.4 but diuresing well.  Swan suggestive of RV failure with low PAPi and CI 1.9 (co-ox 64%).   General: Intubated.  Neck: JVP 10-12 cm, no thyromegaly or thyroid nodule.  Lungs: Decreased BS at bases CV: Chest wall open.  Heart regular S1/S2, no S3/S4, no murmur.  No peripheral edema.   Abdomen: Soft, nontender, no hepatosplenomegaly, no distention.  Skin: Intact without lesions or rashes.  Neurologic: Sedated on vent Extremities: No clubbing or cyanosis.  HEENT: Normal.   Hemodynamics suggestive of RV failure with low PAPi and CI 1.9 (co-ox 64%).  He is on epinephrine 1 but BP is elevated.  - Will stop epinephrine and start milrinone 0.25 mcg/kg/min.  - Continue Lasix 10 mg/hr for gentle diuresis. Creatinine appears to have plateaued at 4.1.  CVP 11-12 - May need a day of further diuresis prior to closing chest.  - CXR today.  - Repeat echo when chest closed to reassess RV.   He remains on amiodarone gtt and is in NSR. He had DCCV from atrial flutter to NSR, also had PE noted prior to CABG.  Off heparin gtt with tamponade/return to OR.  He is currently on ASA and Plavix.  - Continue amiodarone gtt.  - Will need to discuss long-term antiplatelet/anticoagulation plan with Dr. Orvan Seen. He was cardioverted from atrial flutter and prior to CABG was noted to have PE.  When we restart anticoagulation, will need to stop Plavix .  Check LFTs in am, if trending down will restart statin.   CRITICAL CARE Performed by: Loralie Champagne  Total critical care time: 40 minutes  Critical care time was exclusive of separately billable procedures and  treating other patients.  Critical care was necessary to treat or prevent imminent or life-threatening deterioration.  Critical care was time spent personally by me on the following activities: development of treatment plan with patient and/or surrogate as well as nursing, discussions with consultants, evaluation of patient's response to treatment, examination of patient, obtaining history from patient or surrogate, ordering and performing treatments and interventions, ordering and review of laboratory studies, ordering and review of radiographic studies, pulse oximetry and re-evaluation of patient's condition.   Loralie Champagne 08/21/2019 10:05 AM

## 2019-08-21 NOTE — Op Note (Signed)
Procedure(s): CHEST WASHOUT S/P OPEN CHEST Procedure Note  Christian [name] male 74 y.o. 08/21/2019  Procedure(s) and Anesthesia Type:    * CHEST WASHOUT S/P OPEN CHEST - General  Surgeon(s) and Role:    [name], MD - Primary   Indications: The patient is s/p CABG and has open chest. He is taken to the OR for chest washout     Surgeon: [name]   Assistants: staff  Anesthesia: General endotracheal anesthesia  ASA Class: 4    Procedure Detail  CHEST WASHOUT S/P OPEN CHEST  After informed consent, he is taken to the OR and placed supine. Anesthesia is confirmed; the chest is cleansed and draped sterilely. A preoperative pause is performed. The existing dressing is removed; the mediastinum is inspected. There are no significant blood clots or retained debris. Copious irrigation is undertaken. Atrial pacing wires are fixed to the heart. An Esmark membrane is sewn to the skin edges and this is covered with Ioban drape. This concluded the procedure. All sponge and instrument counts were correct.    Estimated Blood Loss:  Minimal         Drains: same           Blood Given: none          Specimens: none                 Complications:  * No complications entered in OR log *         Disposition: ICU - intubated and critically ill.         Condition: stable

## 2019-08-21 NOTE — Progress Notes (Signed)
Nutrition Follow-up  DOCUMENTATION CODES:   Not applicable  INTERVENTION:   Tube Feeding:  Vital AF 1.2 at 45 ml/hr Pro-Stat 30 mL QID Provides 141 g of protein, 1696 kcals and 875 mL of free water  TF regimen and propofol at current rate providing  total kcal/day (2052 % of kcal needs)   NUTRITION DIAGNOSIS:   Increased nutrient needs related to post-op healing as evidenced by estimated needs.  Being addressed via TF   GOAL:   Patient will meet greater than or equal to 90% of their needs  Progressing  MONITOR:   Vent status, Skin, TF tolerance, Weight trends, I & O's, Labs  REASON FOR ASSESSMENT:   Ventilator    ASSESSMENT:   Patient with PMH significant for CAD s/p stenting, HTN, HLD, DM, and BPH. Presents this admission with RLL PE and for CABG.  4/14 Admitted  4/20 CABG x 4, Chest left open 4/22 Chest closure, application of incisional wound vac 4/23 Extubated, Cardiac arrest, chest re-opened for washout, clot removed, Re-Intubated 4/26 Return to OR for washout  Pt to return to OR today for washout, chest to remain open Plan for Cortrak today. No nutrition since 4/20 procedure  Patient is currently intubated on ventilator support; fentanyl, propofol and versed drips MV: 10.2 L/min Temp (24hrs), Avg:97.8 F (36.6 C), Min:97 F (36.1 C), Max:98.1 F (36.7 C)  Propofol: 13.5 ml/hr  EDW around 95.5 kg; current wt 112 kg. Net +8.5 L  OG tube with tip in gastric antrum  Labs: reviewed Meds: lasix drip   Diet Order:   Diet Order            Diet NPO time specified  Diet effective now              EDUCATION NEEDS:   Not appropriate for education at this time  Skin:  Skin Assessment: Skin Integrity Issues: Skin Integrity Issues:: Other (Comment), Incisions Incisions: L arm Other: open chest  Last BM:  4/26  Height:   Ht Readings from Last 1 Encounters:  08/15/19 5\' 9"  (1.753 m)    Weight:   Wt Readings from Last 1 Encounters:   08/21/19 112 kg   BMI:  Body mass index is 36.46 kg/m.  Estimated Nutritional Needs:   Kcal:  1867 kcal  Protein:  145-165 grams  Fluid:  >/= 1.8 L/day   Kerman Passey MS, RDN, LDN, CNSC RD Pager Number and RD On-Call Pager Number Located in [place]

## 2019-08-21 NOTE — Transfer of Care (Signed)
Immediate Anesthesia Transfer of Care Note  Patient: Christian [name]  Procedure(s) Performed: CHEST WASHOUT S/P OPEN CHEST (N/A Chest)  Patient Location: ICU  Anesthesia Type:General  Level of Consciousness: sedated and Patient remains intubated per anesthesia plan  Airway & Oxygen Therapy: Patient remains intubated per anesthesia plan and Patient placed on Ventilator (see vital sign flow sheet for setting)  Post-op Assessment: Report given to RN and Post -op Vital signs reviewed and stable  Post vital signs: Reviewed and stable  Last Vitals:  Vitals Value Taken Time  BP    Temp    Pulse    Resp    SpO2      Last Pain:  Vitals:   08/20/19 2000  TempSrc: Core  PainSc:       Patients Stated Pain Goal: 2 (35/39/12 2583)  Complications: No apparent anesthesia complications

## 2019-08-21 NOTE — Anesthesia Preprocedure Evaluation (Addendum)
Anesthesia Evaluation  Patient identified by MRN, date of birth, ID band Patient unresponsive    Reviewed: Allergy & Precautions, H&P , NPO status , Patient's Chart, lab work & pertinent test results  Airway Mallampati: Intubated       Dental no notable dental hx. (+) Dental Advisory Given, Edentulous Upper, Edentulous Lower   Pulmonary neg pulmonary ROS, former smoker,    Pulmonary exam normal breath sounds clear to auscultation       Cardiovascular hypertension, + CAD, + Past MI, + CABG and +CHF   Rhythm:Regular Rate:Normal     Neuro/Psych negative neurological ROS  negative psych ROS   GI/Hepatic Neg liver ROS, hiatal hernia,   Endo/Other  diabetes  Renal/GU Renal InsufficiencyRenal disease  negative genitourinary   Musculoskeletal   Abdominal   Peds  Hematology negative hematology ROS (+)   Anesthesia Other Findings   Reproductive/Obstetrics negative OB ROS                            Anesthesia Physical Anesthesia Plan  ASA: IV  Anesthesia Plan: General   Post-op Pain Management:    Induction: Intravenous  PONV Risk Score and Plan: 2 and Treatment may vary due to age or medical condition, Ondansetron and Midazolam  Airway Management Planned: Oral ETT  Additional Equipment:   Intra-op Plan:   Post-operative Plan: Post-operative intubation/ventilation  Informed Consent: I have reviewed the patients History and Physical, chart, labs and discussed the procedure including the risks, benefits and alternatives for the proposed anesthesia with the patient or authorized representative who has indicated his/her understanding and acceptance.     Dental advisory given  Plan Discussed with: CRNA  Anesthesia Plan Comments:        Anesthesia Quick Evaluation

## 2019-08-21 NOTE — Procedures (Signed)
Cortrak  Person Inserting Tube:  Rigley Niess E, RD Tube Type:  Cortrak - 43 inches Tube Location:  Left nare Initial Placement:  Stomach Secured by: Bridle Technique Used to Measure Tube Placement:  Documented cm marking at nare/ corner of mouth Cortrak Secured At:  70 cm   Cortrak Tube Team Note:  Consult received to place a Cortrak feeding tube.   No x-ray is required. RN may begin using tube.   If the tube becomes dislodged please keep the tube and contact the Cortrak team at [email or web address] ([email or web address]) for replacement.  If after hours and replacement cannot be delayed, place a NG tube and confirm placement with an abdominal x-ray.    Christian Convery, MS, RD, LDN RD pager number and weekend/on-call pager number located in Amion.    

## 2019-08-22 ENCOUNTER — Inpatient Hospital Stay (HOSPITAL_COMMUNITY): Payer: Medicare Other | Admitting: Certified Registered Nurse Anesthetist

## 2019-08-22 ENCOUNTER — Inpatient Hospital Stay (HOSPITAL_COMMUNITY): Payer: Medicare Other

## 2019-08-22 ENCOUNTER — Ambulatory Visit: Payer: Federal, State, Local not specified - PPO | Admitting: Internal Medicine

## 2019-08-22 ENCOUNTER — Encounter (HOSPITAL_COMMUNITY): Payer: Self-pay

## 2019-08-22 DIAGNOSIS — R570 Cardiogenic shock: Secondary | ICD-10-CM | POA: Diagnosis not present

## 2019-08-22 DIAGNOSIS — N179 Acute kidney failure, unspecified: Secondary | ICD-10-CM | POA: Diagnosis not present

## 2019-08-22 DIAGNOSIS — I214 Non-ST elevation (NSTEMI) myocardial infarction: Secondary | ICD-10-CM | POA: Diagnosis not present

## 2019-08-22 LAB — BASIC METABOLIC PANEL
Anion gap: 14 (ref 5–15)
BUN: 81 mg/dL — ABNORMAL HIGH (ref 8–23)
CO2: 23 mmol/L (ref 22–32)
Calcium: 8.4 mg/dL — ABNORMAL LOW (ref 8.9–10.3)
Chloride: 103 mmol/L (ref 98–111)
Creatinine, Ser: 4.68 mg/dL — ABNORMAL HIGH (ref 0.61–1.24)
GFR calc Af Amer: 13 mL/min — ABNORMAL LOW (ref >60)
GFR calc non Af Amer: 12 mL/min — ABNORMAL LOW (ref >60)
Glucose, Bld: 158 mg/dL — ABNORMAL HIGH (ref 70–99)
Potassium: 4 mmol/L (ref 3.5–5.1)
Sodium: 140 mmol/L (ref 135–145)

## 2019-08-22 LAB — CULTURE, BLOOD (ROUTINE X 2)
Culture: NO GROWTH
Culture: NO GROWTH
Special Requests: ADEQUATE

## 2019-08-22 LAB — COMPREHENSIVE METABOLIC PANEL
ALT: 495 U/L — ABNORMAL HIGH (ref 0–44)
AST: 85 U/L — ABNORMAL HIGH (ref 15–41)
Albumin: 2 g/dL — ABNORMAL LOW (ref 3.5–5.0)
Alkaline Phosphatase: 86 U/L (ref 38–126)
Anion gap: 14 (ref 5–15)
BUN: 74 mg/dL — ABNORMAL HIGH (ref 8–23)
CO2: 24 mmol/L (ref 22–32)
Calcium: 8.4 mg/dL — ABNORMAL LOW (ref 8.9–10.3)
Chloride: 103 mmol/L (ref 98–111)
Creatinine, Ser: 4.55 mg/dL — ABNORMAL HIGH (ref 0.61–1.24)
GFR calc Af Amer: 14 mL/min — ABNORMAL LOW (ref >60)
GFR calc non Af Amer: 12 mL/min — ABNORMAL LOW (ref >60)
Glucose, Bld: 125 mg/dL — ABNORMAL HIGH (ref 70–99)
Potassium: 3.7 mmol/L (ref 3.5–5.1)
Sodium: 141 mmol/L (ref 135–145)
Total Bilirubin: 0.9 mg/dL (ref 0.3–1.2)
Total Protein: 5 g/dL — ABNORMAL LOW (ref 6.5–8.1)

## 2019-08-22 LAB — COOXEMETRY PANEL
Carboxyhemoglobin: 0.5 % (ref 0.5–1.5)
Carboxyhemoglobin: 1.1 % (ref 0.5–1.5)
Methemoglobin: 0.9 % (ref 0.0–1.5)
Methemoglobin: 1.4 % (ref 0.0–1.5)
O2 Saturation: 98.1 %
O2 Saturation: 71.5 %
Total hemoglobin: 12 g/dL (ref 12.0–16.0)
Total hemoglobin: 9.9 g/dL — ABNORMAL LOW (ref 12.0–16.0)

## 2019-08-22 LAB — CBC
HCT: 33.6 % — ABNORMAL LOW (ref 39.0–52.0)
Hemoglobin: 10.8 g/dL — ABNORMAL LOW (ref 13.0–17.0)
MCH: 29.5 pg (ref 26.0–34.0)
MCHC: 32.1 g/dL (ref 30.0–36.0)
MCV: 91.8 fL (ref 80.0–100.0)
Platelets: 175 10*3/uL (ref 150–400)
RBC: 3.66 MIL/uL — ABNORMAL LOW (ref 4.22–5.81)
RDW: 17.3 % — ABNORMAL HIGH (ref 11.5–15.5)
WBC: 12.8 10*3/uL — ABNORMAL HIGH (ref 4.0–10.5)
nRBC: 0.7 % — ABNORMAL HIGH (ref 0.0–0.2)

## 2019-08-22 LAB — GLUCOSE, CAPILLARY
Glucose-Capillary: 105 mg/dL — ABNORMAL HIGH (ref 70–99)
Glucose-Capillary: 120 mg/dL — ABNORMAL HIGH (ref 70–99)
Glucose-Capillary: 125 mg/dL — ABNORMAL HIGH (ref 70–99)
Glucose-Capillary: 135 mg/dL — ABNORMAL HIGH (ref 70–99)
Glucose-Capillary: 142 mg/dL — ABNORMAL HIGH (ref 70–99)
Glucose-Capillary: 166 mg/dL — ABNORMAL HIGH (ref 70–99)

## 2019-08-22 LAB — PHOSPHORUS: Phosphorus: 6.8 mg/dL — ABNORMAL HIGH (ref 2.5–4.6)

## 2019-08-22 LAB — VANCOMYCIN, RANDOM: Vancomycin Rm: 22

## 2019-08-22 MED ORDER — PROPOFOL 10 MG/ML IV BOLUS
INTRAVENOUS | Status: DC | PRN
  Administered 2019-08-22: 50 mg via INTRAVENOUS

## 2019-08-22 MED ORDER — SODIUM CHLORIDE 0.9 % IV SOLN: 250.0000 mL | INTRAVENOUS | Status: DC

## 2019-08-22 MED ORDER — PHENYLEPHRINE 40 MCG/ML (10ML) SYRINGE FOR IV PUSH (FOR BLOOD PRESSURE SUPPORT)
PREFILLED_SYRINGE | INTRAVENOUS | Status: DC | PRN
  Administered 2019-08-22: 80 ug via INTRAVENOUS

## 2019-08-22 MED ORDER — VITAL AF 1.2 CAL PO LIQD
1000.0000 mL | ORAL | Status: DC
  Administered 2019-08-22 – 2019-08-24 (×4): 1000 mL

## 2019-08-22 MED ORDER — MILRINONE LACTATE IN DEXTROSE 20-5 MG/100ML-% IV SOLN
0.1250 ug/kg/min | INTRAVENOUS | Status: DC
  Administered 2019-08-22: 0.125 ug/kg/min via INTRAVENOUS

## 2019-08-22 MED ORDER — ROCURONIUM BROMIDE 10 MG/ML (PF) SYRINGE
PREFILLED_SYRINGE | INTRAVENOUS | Status: DC | PRN
Start: 2019-08-22 — End: 2019-08-22
  Administered 2019-08-22: 50 mg via INTRAVENOUS

## 2019-08-22 MED ORDER — FUROSEMIDE 10 MG/ML IJ SOLN
40.0000 mg | Freq: Two times a day (BID) | INTRAMUSCULAR | Status: DC
  Administered 2019-08-22 – 2019-08-23 (×2): 40 mg via INTRAVENOUS
  Filled 2019-08-22: qty 4

## 2019-08-22 MED ORDER — POTASSIUM CHLORIDE 10 MEQ/50ML IV SOLN
10.0000 meq | INTRAVENOUS | Status: AC
  Administered 2019-08-22 (×3): 10 meq via INTRAVENOUS
  Filled 2019-08-22 (×3): qty 50

## 2019-08-22 MED ORDER — ATORVASTATIN CALCIUM 40 MG PO TABS
40.0000 mg | ORAL_TABLET | Freq: Every day | ORAL | Status: DC
  Administered 2019-08-22 – 2019-10-13 (×45): 40 mg
  Filled 2019-08-22 (×48): qty 1

## 2019-08-22 NOTE — Progress Notes (Signed)
[facility] BRIEF SICU PROGRESS NOTE  1 Day Post-Op  S/P Procedure(s) (LRB): CHEST WASHOUT S/P OPEN CHEST (N/A)   Sedated on vent ET tube balloon not holding air - for tube change out per anesthesia team HR 100 w/ stable BP on low dose milrinone O2 sats 98-100% UOP adequate Creatinine up slightly 4.7 with stable electrolytes  Plan: Continue current plan  Rexene Alberts, MD 08/22/2019 6:24 PM

## 2019-08-22 NOTE — Progress Notes (Addendum)
Pharmacy Antibiotic Note  Christian [name] is a 74 y.o. male admitted on 08/09/2019 now s/p CABG. Now with open chest after clot removal / Code Blue on 4/23. Pharmacy has been consulted for vancomycin and cefepime dosing.   SCr worsening to 4.55 (est CrCl ~15 mL/min) Vancomycin random level collected after 1 g dose given on 4/25. It is slightly above goal at 22  Plan: Continue cefepime 1 g q24h Contnue vancomycin variable dose per unstable renal function - will not dose today since level is above therapeutic goal Will reassess SCr and LOT for open chest - may go to OR tomorrow to close chest  Height: 5\' 9"  (175.3 cm) Weight: 111.7 kg (246 lb 4.1 oz) IBW/kg (Calculated) : 70.7  Temp (24hrs), Avg:97.5 F (36.4 C), Min:95.4 F (35.2 C), Max:98.4 F (36.9 C)  Recent Labs  Lab 08/19/19 0300 08/19/19 1026 08/19/19 1820 08/19/19 1820 08/20/19 0351 08/20/19 1737 08/21/19 0406 08/22/19 0444 08/22/19 1330  WBC 15.6*  --  14.4*  --  11.1*  --  10.4 12.8*  --   CREATININE 2.97*   < > 3.52*   < > 3.19* 4.10* 4.10* 4.55* 4.68*  VANCORANDOM  --   --   --   --  20  --   --   --  22   < > = values in this interval not displayed.    Estimated Creatinine Clearance: 17.6 mL/min (A) (by C-G formula based on SCr of 4.68 mg/dL (H)).    No Known Allergies   Vancomycin 4/23 >> Cefepime 4/23 >>  4/22 BCx: ngF 4/19 MRSA PCR: neg   Vertis Kelch, PharmD, [facility] PGY2 Cardiology Pharmacy Resident Phone [phone] 08/22/2019       3:48 PM  Please check [email or web address] for unit-specific pharmacist phone numbers

## 2019-08-22 NOTE — Progress Notes (Signed)
Patient ID: Christian [name], male   DOB: 05-19-1945, 74 y.o.   MRN: [id number]     Advanced Heart Failure Rounding Note  PCP-Cardiologist: No primary care provider on file.   Subjective:    08/09/19 Admitted with NSTEM and ? Small RLL PE. Weight 213 pounds.  08/15/19 CABG x4 on 08/15/19 08/18/19 Cardiac arrest due to tamponade. Chest opened for washout 4/26 Chest washout in OR  This morning on milrinone 0.25 with co-ox 72%.   On vanc/cefipime in setting of open chest  Creatinine 0.9 -> 1.5 -> 3.0 -> 3.5 -> 3.2->4.1->4.1->4.55.  I/Os significantly negative yesterday.   Echo 4/25 EF 40 to 45% with some residual pericardial clot.  Swan numbers CVP 8 PA 25/14  Thermo CI 2.1  PAPi 1.4 Co-ox 72%   Objective:   Weight Range: 111.7 kg Body mass index is 36.37 kg/m.   Vital Signs:   Temp:  [95.4 F (35.2 C)-98.1 F (36.7 C)] 98.1 F (36.7 C) (04/27 0747) Pulse Rate:  [47-102] 98 (04/27 0747) Resp:  [0-20] 5 (04/27 0747) BP: (123-176)/(60-72) 157/67 (04/27 0404) SpO2:  [99 %-100 %] 100 % (04/27 0747) Arterial Line BP: (128-195)/(52-78) 128/52 (04/27 0700) FiO2 (%):  [40 %-50 %] 40 % (04/27 0747) Weight:  [111.7 kg] 111.7 kg (04/27 0420) Last BM Date: 08/13/19  Weight change: Filed Weights   08/20/19 0439 08/21/19 0400 08/22/19 0420  Weight: 111.2 kg 112 kg 111.7 kg    Intake/Output:   Intake/Output Summary (Last 24 hours) at 08/22/2019 0813 Last data filed at 08/22/2019 0700 Gross per 24 hour  Intake 1933.89 ml  Output 4410 ml  Net -2476.11 ml      Physical Exam  CVP 8  General: Intubated/sedated Neck: No JVD, no thyromegaly or thyroid nodule.  Lungs: Crackles dependently CV: Chest open.  No peripheral edema.   Abdomen: Soft, nontender, no hepatosplenomegaly, no distention.  Skin: Intact without lesions or rashes.  Neurologic: Sedated on vent Extremities: No clubbing or cyanosis.  HEENT: Normal.    Telemetry   NSR 70-80s personally reviewed.   EKG     N/a   Labs    CBC Recent Labs    08/21/19 0406 08/22/19 0444  WBC 10.4 12.8*  HGB 8.7* 10.8*  HCT 26.5* 33.6*  MCV 93.0 91.8  PLT 133* 063   Basic Metabolic Panel Recent Labs    08/21/19 0406 08/21/19 1720 08/22/19 0444  NA 141  --  141  K 4.0  --  3.7  CL 106  --  103  CO2 24  --  24  GLUCOSE 216*  --  125*  BUN 60*  --  74*  CREATININE 4.10*  --  4.55*  CALCIUM 7.6*  --  8.4*  MG 2.2  --   --   PHOS  --  5.6* 6.8*   Liver Function Tests Recent Labs    08/20/19 1737 08/22/19 0444  AST 424* 85*  ALT 868* 495*  ALKPHOS 82 86  BILITOT 0.5 0.9  PROT 4.5* 5.0*  ALBUMIN 2.2* 2.0*   No results for input(s): LIPASE, AMYLASE in the last 72 hours. Cardiac Enzymes No results for input(s): CKTOTAL, CKMB, CKMBINDEX, TROPONINI in the last 72 hours.  BNP: BNP (last 3 results) Recent Labs    08/09/19 1240  BNP 89.1    ProBNP (last 3 results) Recent Labs    11/23/18 1120 08/09/19 0922  PROBNP 18.0 78.0     D-Dimer No results for input(s): DDIMER in  the last 72 hours. Hemoglobin A1C No results for input(s): HGBA1C in the last 72 hours. Fasting Lipid Panel Recent Labs    08/21/19 2003  TRIG 199*   Thyroid Function Tests No results for input(s): TSH, T4TOTAL, T3FREE, THYROIDAB in the last 72 hours.  Invalid input(s): FREET3  Other results:   Imaging    No results found.   Medications:     Scheduled Medications: . sodium chloride   Intravenous Once  . sodium chloride   Intravenous Once  . aspirin  81 mg Per Tube Daily  . bisacodyl  10 mg Oral Daily   Or  . bisacodyl  10 mg Rectal Daily  . chlorhexidine gluconate (MEDLINE KIT)  15 mL Mouth Rinse BID  . Chlorhexidine Gluconate Cloth  6 each Topical Daily  . clopidogrel  75 mg Per Tube Daily  . docusate  100 mg Per Tube BID  . feeding supplement (PRO-STAT SUGAR FREE 64)  30 mL Per Tube QID  . fentaNYL (SUBLIMAZE) injection  25 mcg Intravenous Once  . furosemide  40 mg Intravenous  BID  . insulin aspart  0-24 Units Subcutaneous Q4H  . isosorbide dinitrate  20 mg Per Tube TID  . mouth rinse  15 mL Mouth Rinse q12n4p  . mouth rinse  15 mL Mouth Rinse 10 times per day  . pantoprazole sodium  40 mg Per Tube Daily  . sodium chloride flush  10-40 mL Intracatheter Q12H  . sodium chloride flush  3 mL Intravenous Q12H  . tamsulosin  0.4 mg Oral QPC breakfast  . trospium  20 mg Per Tube BID  . vancomycin variable dose per unstable renal function (pharmacist dosing)   Does not apply See admin instructions    Infusions: . sodium chloride Stopped (08/16/19 1631)  . sodium chloride    . sodium chloride    . sodium chloride 20 mL/hr (08/21/19 2345)  . amiodarone 30 mg/hr (08/22/19 0807)  . ceFEPime (MAXIPIME) IV Stopped (08/22/19 0058)  . epinephrine Stopped (08/21/19 1531)  . feeding supplement (VITAL AF 1.2 CAL) 1,000 mL (08/21/19 1724)  . fentaNYL infusion INTRAVENOUS 200 mcg/hr (08/21/19 1512)  . insulin Stopped (08/20/19 1148)  . lactated ringers    . lactated ringers    . lactated ringers    . lactated ringers 20 mL/hr at 08/20/19 1200  . magnesium sulfate bolus IVPB    . midazolam 1 mg/hr (08/22/19 0700)  . milrinone 0.25 mcg/kg/min (08/22/19 0700)  . norepinephrine (LEVOPHED) Adult infusion Stopped (08/19/19 1921)  . potassium chloride 50 mL/hr at 08/22/19 0700  . propofol (DIPRIVAN) infusion 20 mcg/kg/min (08/22/19 0700)  . sodium bicarbonate 150 mEq in dextrose 5% 1000 mL Stopped (08/19/19 0707)    PRN Medications: sodium chloride, cromolyn, dextrose, fentaNYL, hydrALAZINE, lactated ringers, midazolam, morphine injection, ondansetron (ZOFRAN) IV, oxyCODONE, sodium chloride flush, sodium chloride flush, traMADol     Assessment/Plan   1. CAD: s/p PDA angioplasty + stent in 02/2014.  Admitted for NSTEMI 4/14. Hs Trop peaked at 3,309.  LHC w/ severe 3V CAD. Echo w/ reduced EF 30-35%. RV ok.  S/p CABG x 4 on 4/20 (LIMA-LAD, RIMA to PDA, Lt radial artery graft  to 1st OM and distal OM branches of LCx).  Chest reopened urgently at the bedside for cardiac tamponade and removal of clot on 4/23  - continue ASA/Plavix.  - Radial harvest. On isordil 20 tid.  - LFTs trending down, can resume atorvastatin 40 mg daily.  - ?Chest closure Wednesday.  2. Acute Systolic Heart Failure: Ischemic Cardiomyopathy -> cardiogenic shock.  LVEF severely reduced at 30-35%, in the setting of NSTEM/ Multivessel CAD, RV ok initially.  Underwent urgent sternotomy at the bedside for cardiac tamponade and removal of clot on 4/23. Chest remains open. Luiz Blare numbers have been concerning for RV failure.  Echo 4/24 EF 40 to 45% with inotropic support.  Small amount of residual pericardial clot.  RV moderately hypokinetic  MAP elevated at times, now SBP in 120s.  CVP down to 8 today with co-ox 72% and CI 2.1. Excellent UOP yesterday but creatinine up again to 4.55.  - Continue milrinone 0.25 mcg/kg/min.  - Stop IV Lasix gtt, Lasix 40 mg IV bid to keep I/Os even.  - Remains on isordil, can add hydralazine 25 mg tid if BP rises.  3.  Postoperative acute hypoxic respiratory failure: Reintubated on 4/23 due to cardiogenic shock and return to OR. - Keep intubated/sedated while chest open.  - CCM following. 4. AKI: Baseline SCr 0.9. Creatinine up to 4.55 today, suspect ATN in setting of cardiogenic shock/tamponade.  Still good UOP.  - See above, stop Lasix gtt today due to CVP 8 and rising creatinine.  5.  Shock liver: Due to cardiac arrest on 4/23.  LFTs trending down.  6. 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 In NSR today.  - Continue amiodarone drip.  - Off heparin currently with open chest and recent tamponade, will need to address long-term plan.  If we are going to anticoagulate, will need to stop Plavix.  7.  Possible small right lower lobe PE: Chest CT 4/14 w/ small RLL PE.  Bilateral Venous Dopplers negative for DVT.  - Off heparin currently with open chest and recent tamponade,  restart low dose heparin when stable per TCTS.  - Anticipate switching eliquis prior to d/c.  8. T2DM: Hgb A1c 7.7 -Remains on insulin 9. Pericardial tamponade: 4/23 return to OR for clot removal, chest wall left open. Patient had been on heparin gtt for DCCV of atrial flutter.  - ?Chest closure Wednesday.   CRITICAL CARE Performed by: Loralie Champagne  Total critical care time: 40 minutes  Critical care time was exclusive of separately billable procedures and treating other patients.  Critical care was necessary to treat or prevent imminent or life-threatening deterioration.  Critical care was time spent personally by me on the following activities: development of treatment plan with patient and/or surrogate as well as nursing, discussions with consultants, evaluation of patient's response to treatment, examination of patient, obtaining history from patient or surrogate, ordering and performing treatments and interventions, ordering and review of laboratory studies, ordering and review of radiographic studies, pulse oximetry and re-evaluation of patient's condition.   Length of Stay: 53  Loralie Champagne, MD  08/22/2019, 8:13 AM  Advanced Heart Failure Team Pager [phone] (M-F; 7a - 4p)  Please contact [facility] for night-coverage after hours (4p -7a ) and weekends on [email or web address]

## 2019-08-22 NOTE — Progress Notes (Signed)
1 Day Post-Op Procedure(s) (LRB): CHEST WASHOUT S/P OPEN CHEST (N/A) Subjective: sedated  Objective: Vital signs in last 24 hours: Temp:  [95.4 F (35.2 C)-98.4 F (36.9 C)] 98.4 F (36.9 C) (04/27 1533) Pulse Rate:  [47-102] 102 (04/27 1533) Cardiac Rhythm: Normal sinus rhythm (04/27 1200) Resp:  [0-29] 18 (04/27 1533) BP: (142-176)/(63-72) 157/67 (04/27 0404) SpO2:  [98 %-100 %] 99 % (04/27 1533) Arterial Line BP: (101-195)/(45-78) 110/48 (04/27 1533) FiO2 (%):  [35 %-50 %] 35 % (04/27 1533) Weight:  [111.7 kg] 111.7 kg (04/27 0420)  Hemodynamic parameters for last 24 hours: PAP: (20-40)/(11-23) 20/11 CVP:  [6 mmHg-19 mmHg] 6 mmHg CO:  [4.6 L/min-6 L/min] 6 L/min CI:  [2.1 L/min/m2-2.8 L/min/m2] 2.8 L/min/m2  Intake/Output from previous day: 04/26 0701 - 04/27 0700 In: 2289.2 [I.V.:2032; NG/GT:120; IV Piggyback:137.2] Out: 4760 [Urine:4420; Chest Tube:340] Intake/Output this shift: Total I/O In: 1333.5 [I.V.:758.9; NG/GT:520.8; IV Piggyback:53.9] Out: 1055 [Urine:925; Chest Tube:130]  PE: Intubated/sedated CTA RRR Abd: sntnd Ext: 2+ edema  Lab Results: Recent Labs    08/21/19 0406 08/22/19 0444  WBC 10.4 12.8*  HGB 8.7* 10.8*  HCT 26.5* 33.6*  PLT 133* 175   BMET:  Recent Labs    08/22/19 0444 08/22/19 1330  NA 141 140  K 3.7 4.0  CL 103 103  CO2 24 23  GLUCOSE 125* 158*  BUN 74* 81*  CREATININE 4.55* 4.68*  CALCIUM 8.4* 8.4*    PT/INR:  Recent Labs    08/19/19 1820  LABPROT 16.3*  INR 1.3*   ABG    Component Value Date/Time   PHART 7.513 (H) 08/20/2019 0404   HCO3 27.9 08/20/2019 0404   TCO2 29 08/20/2019 0404   ACIDBASEDEF 5.0 (H) 08/18/2019 2341   O2SAT 71.5 08/22/2019 0603   CBG (last 3)  Recent Labs    08/22/19 0816 08/22/19 1136 08/22/19 1636  GLUCAP 105* 125* 142*    Assessment/Plan: S/P Procedure(s) (LRB): CHEST WASHOUT S/P OPEN CHEST (N/A) Diuresis anticipate chest closure later in the week.   LOS: 13 days     Christian [name] 08/22/2019

## 2019-08-22 NOTE — Anesthesia Procedure Notes (Signed)
Procedure Name: Intubation Date/Time: 08/22/2019 6:28 PM Performed by: Inda Coke, CRNA Pre-anesthesia Checklist: Patient identified, Emergency Drugs available, Suction available and Patient being monitored Patient Re-evaluated:Patient Re-evaluated prior to induction Oxygen Delivery Method: Circle System Utilized Preoxygenation: Pre-oxygenation with 100% oxygen Induction Type: IV induction Laryngoscope Size: Glidescope and 4 Grade View: Grade I Tube type: Oral Tube size: 7.5 mm Number of attempts: 1 Airway Equipment and Method: Stylet Placement Confirmation: ETT inserted through vocal cords under direct vision,  breath sounds checked- equal and bilateral and CO2 detector Secured at: 25 cm Tube secured with: Tape Dental Injury: Teeth and Oropharynx as per pre-operative assessment

## 2019-08-23 ENCOUNTER — Other Ambulatory Visit: Payer: Self-pay | Admitting: Cardiology

## 2019-08-23 DIAGNOSIS — R570 Cardiogenic shock: Secondary | ICD-10-CM | POA: Diagnosis not present

## 2019-08-23 DIAGNOSIS — N179 Acute kidney failure, unspecified: Secondary | ICD-10-CM | POA: Diagnosis not present

## 2019-08-23 DIAGNOSIS — R600 Localized edema: Secondary | ICD-10-CM

## 2019-08-23 DIAGNOSIS — I214 Non-ST elevation (NSTEMI) myocardial infarction: Secondary | ICD-10-CM | POA: Diagnosis not present

## 2019-08-23 LAB — COMPREHENSIVE METABOLIC PANEL
ALT: 220 U/L — ABNORMAL HIGH (ref 0–44)
AST: 50 U/L — ABNORMAL HIGH (ref 15–41)
Albumin: 1.5 g/dL — ABNORMAL LOW (ref 3.5–5.0)
Alkaline Phosphatase: 82 U/L (ref 38–126)
Anion gap: 13 (ref 5–15)
BUN: 90 mg/dL — ABNORMAL HIGH (ref 8–23)
CO2: 22 mmol/L (ref 22–32)
Calcium: 7.6 mg/dL — ABNORMAL LOW (ref 8.9–10.3)
Chloride: 103 mmol/L (ref 98–111)
Creatinine, Ser: 4.64 mg/dL — ABNORMAL HIGH (ref 0.61–1.24)
GFR calc Af Amer: 14 mL/min — ABNORMAL LOW (ref >60)
GFR calc non Af Amer: 12 mL/min — ABNORMAL LOW (ref >60)
Glucose, Bld: 294 mg/dL — ABNORMAL HIGH (ref 70–99)
Potassium: 4 mmol/L (ref 3.5–5.1)
Sodium: 138 mmol/L (ref 135–145)
Total Bilirubin: 0.8 mg/dL (ref 0.3–1.2)
Total Protein: 4.2 g/dL — ABNORMAL LOW (ref 6.5–8.1)

## 2019-08-23 LAB — COOXEMETRY PANEL
Carboxyhemoglobin: 1.1 % (ref 0.5–1.5)
Methemoglobin: 1.3 % (ref 0.0–1.5)
O2 Saturation: 69.6 %
Total hemoglobin: 11 g/dL — ABNORMAL LOW (ref 12.0–16.0)

## 2019-08-23 LAB — GLUCOSE, CAPILLARY
Glucose-Capillary: 169 mg/dL — ABNORMAL HIGH (ref 70–99)
Glucose-Capillary: 171 mg/dL — ABNORMAL HIGH (ref 70–99)
Glucose-Capillary: 168 mg/dL — ABNORMAL HIGH (ref 70–99)
Glucose-Capillary: 166 mg/dL — ABNORMAL HIGH (ref 70–99)
Glucose-Capillary: 170 mg/dL — ABNORMAL HIGH (ref 70–99)

## 2019-08-23 LAB — CBC
HCT: 27.1 % — ABNORMAL LOW (ref 39.0–52.0)
Hemoglobin: 8.5 g/dL — ABNORMAL LOW (ref 13.0–17.0)
MCH: 29.6 pg (ref 26.0–34.0)
MCHC: 31.4 g/dL (ref 30.0–36.0)
MCV: 94.4 fL (ref 80.0–100.0)
Platelets: 151 10*3/uL (ref 150–400)
RBC: 2.87 MIL/uL — ABNORMAL LOW (ref 4.22–5.81)
RDW: 17.2 % — ABNORMAL HIGH (ref 11.5–15.5)
WBC: 11.1 10*3/uL — ABNORMAL HIGH (ref 4.0–10.5)
nRBC: 0.2 % (ref 0.0–0.2)

## 2019-08-23 LAB — PHOSPHORUS: Phosphorus: 6.7 mg/dL — ABNORMAL HIGH (ref 2.5–4.6)

## 2019-08-23 LAB — TRIGLYCERIDES: Triglycerides: 213 mg/dL — ABNORMAL HIGH (ref <150)

## 2019-08-23 MED ORDER — FUROSEMIDE 10 MG/ML IJ SOLN
8.0000 mg/h | INTRAVENOUS | Status: DC
  Administered 2019-08-23: 8 mg/h via INTRAVENOUS
  Filled 2019-08-23 (×2): qty 25

## 2019-08-23 MED ORDER — FUROSEMIDE 10 MG/ML IJ SOLN: 40.0000 mg | Freq: Once | INTRAMUSCULAR | Status: AC

## 2019-08-23 MED ORDER — HYDRALAZINE HCL 25 MG PO TABS
25.0000 mg | ORAL_TABLET | Freq: Three times a day (TID) | ORAL | Status: DC
  Administered 2019-08-24 – 2019-08-26 (×5): 25 mg
  Filled 2019-08-23 (×6): qty 1

## 2019-08-23 MED ORDER — FUROSEMIDE 10 MG/ML IJ SOLN
INTRAMUSCULAR | Status: AC
  Filled 2019-08-23: qty 4

## 2019-08-23 MED ORDER — HYDRALAZINE HCL 25 MG PO TABS
25.0000 mg | ORAL_TABLET | Freq: Three times a day (TID) | ORAL | Status: DC
  Administered 2019-08-23 (×3): 25 mg via ORAL
  Filled 2019-08-23 (×3): qty 1

## 2019-08-23 NOTE — Progress Notes (Signed)
Patient ID: ADDAM GOELLER, male   DOB: [birth year], 74 y.o.   MRN: [id number]     Advanced Heart Failure Rounding Note  PCP-Cardiologist: No primary care provider on file.   Subjective:    08/09/19 Admitted with NSTEM and ? Small RLL PE. Weight 213 pounds.  08/15/19 CABG x4 on 08/15/19 08/18/19 Cardiac arrest due to tamponade. Chest opened for washout 4/26 Chest washout in OR  Patient is now off all pressors/inotropes.  SBP 150s this morning.    On vanc/cefepime in setting of open chest  Creatinine 0.9 -> 1.5 -> 3.0 -> 3.5 -> 3.2->4.1->4.1->4.55->4.64.  I/Os positive yesterday, CVP rising again.  CXR yesterday with left basilar atelectasis.   Echo 4/25: EF 40-45% with some residual pericardial clot.  Swan numbers CVP 16-17 PA 30/19 Thermo CI 2.7  PAPi 0.69 Co-ox 78%   Objective:   Weight Range: 112.7 kg Body mass index is 36.69 kg/m.   Vital Signs:   Temp:  [98.1 F (36.7 C)-98.8 F (37.1 C)] 98.1 F (36.7 C) (04/28 0700) Pulse Rate:  [89-146] 89 (04/28 0700) Resp:  [0-29] 18 (04/28 0700) BP: (99-150)/(50-64) 120/56 (04/28 0327) SpO2:  [98 %-100 %] 100 % (04/28 0700) Arterial Line BP: (82-207)/(41-89) 159/70 (04/28 0800) FiO2 (%):  [35 %-50 %] 40 % (04/28 0327) Weight:  [112.7 kg] 112.7 kg (04/28 0500) Last BM Date: 08/13/19  Weight change: Filed Weights   08/21/19 0400 08/22/19 0420 08/23/19 0500  Weight: 112 kg 111.7 kg 112.7 kg    Intake/Output:   Intake/Output Summary (Last 24 hours) at 08/23/2019 0816 Last data filed at 08/23/2019 0700 Gross per 24 hour  Intake 3099.53 ml  Output 1825 ml  Net 1274.53 ml      Physical Exam  CVP 16  General: Intubated/sedated Neck: JVP 14 cm, no thyromegaly or thyroid nodule.  Lungs: Decreased BS at bases.  CV: Nondisplaced PMI.  Heart regular S1/S2, no S3/S4, no murmur.  1+ edema to thighs.   Abdomen: Soft, nontender, no hepatosplenomegaly, no distention.  Skin: Intact without lesions or rashes.  Neurologic:  Sedated on vent Extremities: No clubbing or cyanosis.  HEENT: Normal.    Telemetry   NSR 70-80s personally reviewed.   EKG    N/a   Labs    CBC Recent Labs    08/22/19 0444 08/23/19 0552  WBC 12.8* 11.1*  HGB 10.8* 8.5*  HCT 33.6* 27.1*  MCV 91.8 94.4  PLT 175 324   Basic Metabolic Panel Recent Labs    08/21/19 0406 08/21/19 1720 08/22/19 0444 08/22/19 0444 08/22/19 1330 08/23/19 0552  NA 141  --  141   < > 140 138  K 4.0  --  3.7   < > 4.0 4.0  CL 106  --  103   < > 103 103  CO2 24  --  24   < > 23 22  GLUCOSE 216*  --  125*   < > 158* 294*  BUN 60*  --  74*   < > 81* 90*  CREATININE 4.10*  --  4.55*   < > 4.68* 4.64*  CALCIUM 7.6*  --  8.4*   < > 8.4* 7.6*  MG 2.2  --   --   --   --   --   PHOS  --    < > 6.8*  --   --  6.7*   < > = values in this interval not displayed.   Liver Function Tests Recent  Labs    08/22/19 0444 08/23/19 0552  AST 85* 50*  ALT 495* 220*  ALKPHOS 86 82  BILITOT 0.9 0.8  PROT 5.0* 4.2*  ALBUMIN 2.0* 1.5*   No results for input(s): LIPASE, AMYLASE in the last 72 hours. Cardiac Enzymes No results for input(s): CKTOTAL, CKMB, CKMBINDEX, TROPONINI in the last 72 hours.  BNP: BNP (last 3 results) Recent Labs    08/09/19 1240  BNP 89.1    ProBNP (last 3 results) Recent Labs    11/23/18 1120 08/09/19 0922  PROBNP 18.0 78.0     D-Dimer No results for input(s): DDIMER in the last 72 hours. Hemoglobin A1C No results for input(s): HGBA1C in the last 72 hours. Fasting Lipid Panel Recent Labs    08/21/19 2003  TRIG 199*   Thyroid Function Tests No results for input(s): TSH, T4TOTAL, T3FREE, THYROIDAB in the last 72 hours.  Invalid input(s): FREET3  Other results:   Imaging    DG CHEST PORT 1 VIEW  Result Date: 08/22/2019 CLINICAL DATA:  CHF.  CABG. EXAM: PORTABLE CHEST 1 VIEW COMPARISON:  08/20/2019 FINDINGS: Endotracheal tube 15 mm above the carina. NG tube removed. Feeding tube is in place with the  tip not visualized. Swan-Ganz catheter in the left main pulmonary artery. Bilateral chest tubes in place. Pericardial drain in place. No pneumothorax. Progression of left lower lobe atelectasis.  No edema. IMPRESSION: Endotracheal tube 15 mm above the carina. Bilateral chest tubes in place.  No pneumothorax. Progression of left lower lobe atelectasis. Electronically Signed   By: Franchot Gallo M.D.   On: 08/22/2019 09:22   DG Chest Port 1V same Day  Result Date: 08/22/2019 CLINICAL DATA:  Check endotracheal tube placement EXAM: PORTABLE CHEST 1 VIEW COMPARISON:  08/22/2018 FINDINGS: Endotracheal tube is noted 1.7 cm above the carina. Feeding catheter is noted extending into the distal stomach. Bilateral chest tube, pericardial drain and mediastinal drain are noted. No pneumothorax is seen. No focal infiltrate is noted. Mild left basilar atelectasis is again seen. Swan-Ganz catheter is noted within the left pulmonary artery. No bony abnormality is seen. IMPRESSION: Left basilar atelectasis. Tubes and lines as described above stable from the prior exam. Electronically Signed   By: Inez Catalina M.D.   On: 08/22/2019 19:25     Medications:     Scheduled Medications: . aspirin  81 mg Per Tube Daily  . atorvastatin  40 mg Per Tube Daily  . bisacodyl  10 mg Oral Daily   Or  . bisacodyl  10 mg Rectal Daily  . chlorhexidine gluconate (MEDLINE KIT)  15 mL Mouth Rinse BID  . Chlorhexidine Gluconate Cloth  6 each Topical Daily  . clopidogrel  75 mg Per Tube Daily  . docusate  100 mg Per Tube BID  . feeding supplement (PRO-STAT SUGAR FREE 64)  30 mL Per Tube QID  . fentaNYL (SUBLIMAZE) injection  25 mcg Intravenous Once  . furosemide  40 mg Intravenous Once  . hydrALAZINE  25 mg Oral Q8H  . insulin aspart  0-24 Units Subcutaneous Q4H  . isosorbide dinitrate  20 mg Per Tube TID  . mouth rinse  15 mL Mouth Rinse 10 times per day  . pantoprazole sodium  40 mg Per Tube Daily  . sodium chloride flush   10-40 mL Intracatheter Q12H  . sodium chloride flush  3 mL Intravenous Q12H  . tamsulosin  0.4 mg Oral QPC breakfast  . trospium  20 mg Per Tube BID  .  vancomycin variable dose per unstable renal function (pharmacist dosing)   Does not apply See admin instructions    Infusions: . sodium chloride 10 mL/hr at 08/23/19 0600  . sodium chloride 20 mL/hr at 08/23/19 0600  . sodium chloride Stopped (08/22/19 1830)  . amiodarone 30 mg/hr (08/23/19 0600)  . ceFEPime (MAXIPIME) IV Stopped (08/23/19 0256)  . epinephrine Stopped (08/21/19 1531)  . feeding supplement (VITAL AF 1.2 CAL) 45 mL/hr at 08/23/19 0600  . fentaNYL infusion INTRAVENOUS 175 mcg/hr (08/23/19 0600)  . furosemide (LASIX) infusion    . lactated ringers    . lactated ringers    . lactated ringers    . lactated ringers 20 mL/hr at 08/20/19 1200  . magnesium sulfate bolus IVPB    . midazolam 1 mg/hr (08/23/19 0600)  . norepinephrine (LEVOPHED) Adult infusion Stopped (08/22/19 1828)  . propofol (DIPRIVAN) infusion 15 mcg/kg/min (08/23/19 0600)  . sodium bicarbonate 150 mEq in dextrose 5% 1000 mL Stopped (08/19/19 0707)    PRN Medications: sodium chloride, cromolyn, dextrose, fentaNYL, hydrALAZINE, lactated ringers, midazolam, morphine injection, ondansetron (ZOFRAN) IV, oxyCODONE, sodium chloride flush, sodium chloride flush, traMADol     Assessment/Plan   1. CAD: s/p PDA angioplasty + stent in 02/2014.  Admitted for NSTEMI 4/14. Hs Trop peaked at 3,309.  LHC w/ severe 3V CAD. Echo w/ reduced EF 30-35%. RV ok.  S/p CABG x 4 on 4/20 (LIMA-LAD, RIMA to PDA, Lt radial artery graft to 1st OM and distal OM branches of LCx).  Chest reopened urgently at the bedside for cardiac tamponade and removal of clot on 4/23  - continue ASA/Plavix.  - Radial harvest. On isordil 20 tid.  - Atorvastatin 40 daily.   - Chest closure later this week.  2. Acute Systolic Heart Failure: Ischemic Cardiomyopathy -> cardiogenic shock.  LVEF severely  reduced at 30-35%, in the setting of NSTEM/ Multivessel CAD, RV ok initially.  Underwent urgent sternotomy at the bedside for cardiac tamponade and removal of clot on 4/23. Chest remains open. Luiz Blare numbers have been concerning for RV failure.  Echo 4/24 EF 40 to 45% with inotropic support.  Small amount of residual pericardial clot.  RV moderately hypokinetic  MAP elevated at times, now SBP in 150s.  Lasix gtt stopped yesterday, CVP now back up to 16-17 with peripheral edema worsening and weight up.  He is now off milrinone with good cardiac output by Luiz Blare and co-ox.  Creatinine continues to trend up.  - Lasix 40 mg IV x 1 then Lasix 8 mg/hr, aim to keep gently negative.  - Remains on isordil, add hydralazine 25 mg tid for afterload reduction with elevated BP.   3.  Postoperative acute hypoxic respiratory failure: Reintubated on 4/23 due to cardiogenic shock and return to OR. - Keep intubated/sedated while chest open.  4. AKI: Baseline SCr 0.9. Creatinine up to 4.64 today, suspect ATN in setting of cardiogenic shock/tamponade.  Still with reasonable UOP. - See above regarding Lasix dosing today with rising CVP.  5.  Shock liver: Due to cardiac arrest on 4/23.  LFTs trending down.  6. 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 In NSR today.  - Continue amiodarone drip.  - Off heparin currently with open chest and recent tamponade, will need to address long-term plan.  If we are going to anticoagulate, will need to stop Plavix.  7.  Possible small right lower lobe PE: Chest CT 4/14 w/ small RLL PE.  Bilateral Venous Dopplers negative for DVT.  -  Off heparin currently with open chest and recent tamponade, restart low dose heparin when stable per TCTS.  - Anticipate switching eliquis prior to d/c.  8. T2DM: Hgb A1c 7.7 -Remains on insulin 9. Pericardial tamponade: 4/23 return to OR for clot removal, chest wall left open. Patient had been on heparin gtt for DCCV of atrial flutter.  - Timing of chest  closure per surgery.   CRITICAL CARE Performed by: Loralie Champagne  Total critical care time: 40 minutes  Critical care time was exclusive of separately billable procedures and treating other patients.  Critical care was necessary to treat or prevent imminent or life-threatening deterioration.  Critical care was time spent personally by me on the following activities: development of treatment plan with patient and/or surrogate as well as nursing, discussions with consultants, evaluation of patient's response to treatment, examination of patient, obtaining history from patient or surrogate, ordering and performing treatments and interventions, ordering and review of laboratory studies, ordering and review of radiographic studies, pulse oximetry and re-evaluation of patient's condition.   Length of Stay: [place] Cam[place], MD  08/23/2019, 8:16 AM  Advanced Heart Failure Team Pager [phone] (M-F; 7a - 4p)  Please contact [facility] for night-coverage after hours (4p -7a ) and weekends on [email or web address]

## 2019-08-23 NOTE — Progress Notes (Signed)
Patient ID: Christian [name], male   DOB: 12-14-1945, 74 y.o.   MRN: [id number] TCTS Evening Rounds:  Hemodynamically stable, CI 2.5.  Remains sedated on vent. Chest still open.  Diuresing on lasix drip.

## 2019-08-24 ENCOUNTER — Inpatient Hospital Stay (HOSPITAL_COMMUNITY): Payer: Medicare Other

## 2019-08-24 ENCOUNTER — Inpatient Hospital Stay (HOSPITAL_COMMUNITY): Payer: Medicare Other | Admitting: Certified Registered Nurse Anesthetist

## 2019-08-24 ENCOUNTER — Encounter: Payer: Self-pay | Admitting: *Deleted

## 2019-08-24 ENCOUNTER — Other Ambulatory Visit: Payer: Self-pay | Admitting: Cardiology

## 2019-08-24 ENCOUNTER — Encounter (HOSPITAL_COMMUNITY)
Admission: EM | Disposition: A | Discharge status: Skilled Nursing Facility | Payer: Self-pay | Source: Home / Self Care | Attending: Cardiothoracic Surgery

## 2019-08-24 DIAGNOSIS — I5023 Acute on chronic systolic (congestive) heart failure: Secondary | ICD-10-CM | POA: Diagnosis not present

## 2019-08-24 DIAGNOSIS — I9789 Other postprocedural complications and disorders of the circulatory system, not elsewhere classified: Secondary | ICD-10-CM

## 2019-08-24 DIAGNOSIS — I214 Non-ST elevation (NSTEMI) myocardial infarction: Secondary | ICD-10-CM | POA: Diagnosis not present

## 2019-08-24 DIAGNOSIS — N179 Acute kidney failure, unspecified: Secondary | ICD-10-CM | POA: Diagnosis not present

## 2019-08-24 DIAGNOSIS — I119 Hypertensive heart disease without heart failure: Secondary | ICD-10-CM

## 2019-08-24 HISTORY — PX: APPLICATION OF WOUND VAC: SHX5189

## 2019-08-24 HISTORY — PX: EXPLORATION POST OPERATIVE OPEN HEART: SHX5061

## 2019-08-24 LAB — GLUCOSE, CAPILLARY
Glucose-Capillary: 120 mg/dL — ABNORMAL HIGH (ref 70–99)
Glucose-Capillary: 120 mg/dL — ABNORMAL HIGH (ref 70–99)
Glucose-Capillary: 120 mg/dL — ABNORMAL HIGH (ref 70–99)
Glucose-Capillary: 140 mg/dL — ABNORMAL HIGH (ref 70–99)
Glucose-Capillary: 174 mg/dL — ABNORMAL HIGH (ref 70–99)
Glucose-Capillary: 183 mg/dL — ABNORMAL HIGH (ref 70–99)
Glucose-Capillary: 185 mg/dL — ABNORMAL HIGH (ref 70–99)

## 2019-08-24 LAB — COMPREHENSIVE METABOLIC PANEL
ALT: 169 U/L — ABNORMAL HIGH (ref 0–44)
AST: 59 U/L — ABNORMAL HIGH (ref 15–41)
Albumin: 1.6 g/dL — ABNORMAL LOW (ref 3.5–5.0)
Alkaline Phosphatase: 115 U/L (ref 38–126)
Anion gap: 16 — ABNORMAL HIGH (ref 5–15)
BUN: 114 mg/dL — ABNORMAL HIGH (ref 8–23)
CO2: 22 mmol/L (ref 22–32)
Calcium: 8.2 mg/dL — ABNORMAL LOW (ref 8.9–10.3)
Chloride: 102 mmol/L (ref 98–111)
Creatinine, Ser: 5.27 mg/dL — ABNORMAL HIGH (ref 0.61–1.24)
GFR calc Af Amer: 12 mL/min — ABNORMAL LOW (ref >60)
GFR calc non Af Amer: 10 mL/min — ABNORMAL LOW (ref >60)
Glucose, Bld: 204 mg/dL — ABNORMAL HIGH (ref 70–99)
Potassium: 4.2 mmol/L (ref 3.5–5.1)
Sodium: 140 mmol/L (ref 135–145)
Total Bilirubin: 0.7 mg/dL (ref 0.3–1.2)
Total Protein: 4.3 g/dL — ABNORMAL LOW (ref 6.5–8.1)

## 2019-08-24 LAB — TRIGLYCERIDES: Triglycerides: 117 mg/dL (ref <150)

## 2019-08-24 LAB — CBC
HCT: 27.2 % — ABNORMAL LOW (ref 39.0–52.0)
Hemoglobin: 8.6 g/dL — ABNORMAL LOW (ref 13.0–17.0)
MCH: 29.8 pg (ref 26.0–34.0)
MCHC: 31.6 g/dL (ref 30.0–36.0)
MCV: 94.1 fL (ref 80.0–100.0)
Platelets: 171 10*3/uL (ref 150–400)
RBC: 2.89 MIL/uL — ABNORMAL LOW (ref 4.22–5.81)
RDW: 17.3 % — ABNORMAL HIGH (ref 11.5–15.5)
WBC: 12.2 10*3/uL — ABNORMAL HIGH (ref 4.0–10.5)
nRBC: 0.2 % (ref 0.0–0.2)

## 2019-08-24 LAB — PREPARE RBC (CROSSMATCH)

## 2019-08-24 LAB — VANCOMYCIN, RANDOM: Vancomycin Rm: 16

## 2019-08-24 LAB — COOXEMETRY PANEL
Carboxyhemoglobin: 0.8 % (ref 0.5–1.5)
Methemoglobin: 0.9 % (ref 0.0–1.5)
O2 Saturation: 65 %
Total hemoglobin: 9.4 g/dL — ABNORMAL LOW (ref 12.0–16.0)

## 2019-08-24 LAB — MAGNESIUM: Magnesium: 2.3 mg/dL (ref 1.7–2.4)

## 2019-08-24 SURGERY — EXPLORATION POST OPERATIVE OPEN HEART
Anesthesia: General | Site: Chest

## 2019-08-24 MED ORDER — PROPOFOL 10 MG/ML IV BOLUS
INTRAVENOUS | Status: DC | PRN
  Administered 2019-08-24: 30 mg via INTRAVENOUS

## 2019-08-24 MED ORDER — PHENYLEPHRINE 40 MCG/ML (10ML) SYRINGE FOR IV PUSH (FOR BLOOD PRESSURE SUPPORT)
PREFILLED_SYRINGE | INTRAVENOUS | Status: DC | PRN
  Administered 2019-08-24: 120 ug via INTRAVENOUS

## 2019-08-24 MED ORDER — HEPARIN SODIUM (PORCINE) 1000 UNIT/ML DIALYSIS
1000.0000 [IU] | INTRAMUSCULAR | Status: DC | PRN
  Filled 2019-08-24: qty 6
  Filled 2019-08-24: qty 1
  Filled 2019-08-24: qty 4

## 2019-08-24 MED ORDER — PRISMASOL BGK 4/2.5 32-4-2.5 MEQ/L REPLACEMENT SOLN
Status: DC
  Administered 2019-08-24 – 2019-08-27 (×3): 1 via INTRAVENOUS_CENTRAL

## 2019-08-24 MED ORDER — HEPARIN (PORCINE) 2000 UNITS/L FOR CRRT
INTRAVENOUS_CENTRAL | Status: DC | PRN
  Filled 2019-08-24 (×3): qty 1000

## 2019-08-24 MED ORDER — PHENYLEPHRINE 40 MCG/ML (10ML) SYRINGE FOR IV PUSH (FOR BLOOD PRESSURE SUPPORT)
PREFILLED_SYRINGE | INTRAVENOUS | Status: AC
  Filled 2019-08-24: qty 10

## 2019-08-24 MED ORDER — SODIUM CHLORIDE 0.9% IV SOLUTION: Freq: Once | INTRAVENOUS | Status: DC

## 2019-08-24 MED ORDER — VANCOMYCIN HCL 1250 MG/250ML IV SOLN
1250.0000 mg | INTRAVENOUS | Status: DC
  Administered 2019-08-25 – 2019-08-29 (×6): 1250 mg via INTRAVENOUS
  Filled 2019-08-24 (×7): qty 250

## 2019-08-24 MED ORDER — PRISMASOL BGK 4/2.5 32-4-2.5 MEQ/L REPLACEMENT SOLN
Status: DC
  Administered 2019-08-24: 1 via INTRAVENOUS_CENTRAL

## 2019-08-24 MED ORDER — SODIUM CHLORIDE 0.9 % IV SOLN
2.0000 g | Freq: Two times a day (BID) | INTRAVENOUS | Status: DC
  Administered 2019-08-24 – 2019-08-31 (×15): 2 g via INTRAVENOUS
  Filled 2019-08-24 (×16): qty 2

## 2019-08-24 MED ORDER — VANCOMYCIN HCL 1000 MG IV SOLR
INTRAVENOUS | Status: DC | PRN
  Administered 2019-08-24: 1000 mL

## 2019-08-24 MED ORDER — VANCOMYCIN HCL 1250 MG/250ML IV SOLN
1250.0000 mg | INTRAVENOUS | Status: DC
  Filled 2019-08-24: qty 250

## 2019-08-24 MED ORDER — PROPOFOL 10 MG/ML IV BOLUS
INTRAVENOUS | Status: AC
  Filled 2019-08-24: qty 20

## 2019-08-24 MED ORDER — FENTANYL CITRATE (PF) 100 MCG/2ML IJ SOLN
INTRAMUSCULAR | Status: DC | PRN
  Administered 2019-08-24: 100 ug via INTRAVENOUS

## 2019-08-24 MED ORDER — PRISMASOL BGK 4/2.5 32-4-2.5 MEQ/L IV SOLN
INTRAVENOUS | Status: DC
  Administered 2019-08-24 – 2019-08-29 (×6): 1 mL via INTRAVENOUS_CENTRAL

## 2019-08-24 MED ORDER — 0.9 % SODIUM CHLORIDE (POUR BTL) OPTIME
TOPICAL | Status: DC | PRN
  Administered 2019-08-24: 1000 mL

## 2019-08-24 MED ORDER — AMIODARONE HCL IN DEXTROSE 360-4.14 MG/200ML-% IV SOLN: 30.0000 mg/h | INTRAVENOUS | Status: AC

## 2019-08-24 MED ORDER — FENTANYL CITRATE (PF) 250 MCG/5ML IJ SOLN
INTRAMUSCULAR | Status: AC
  Filled 2019-08-24: qty 5

## 2019-08-24 MED ORDER — PROPOFOL 1000 MG/100ML IV EMUL
INTRAVENOUS | Status: AC
  Filled 2019-08-24: qty 100

## 2019-08-24 MED ORDER — AMIODARONE HCL 200 MG PO TABS
200.0000 mg | ORAL_TABLET | Freq: Two times a day (BID) | ORAL | Status: DC
  Administered 2019-08-24 – 2019-08-26 (×5): 200 mg via ORAL
  Filled 2019-08-24 (×5): qty 1

## 2019-08-24 MED ORDER — 0.9 % SODIUM CHLORIDE (POUR BTL) OPTIME
TOPICAL | Status: DC | PRN
  Administered 2019-08-18: 2000 mL

## 2019-08-24 MED ORDER — SODIUM CHLORIDE 0.9 % IR SOLN
Status: DC | PRN
  Administered 2019-08-24: 3000 mL

## 2019-08-24 MED ORDER — LACTATED RINGERS IV SOLN: INTRAVENOUS | Status: DC | PRN

## 2019-08-24 MED ORDER — ROCURONIUM BROMIDE 100 MG/10ML IV SOLN
INTRAVENOUS | Status: DC | PRN
  Administered 2019-08-24 (×2): 20 mg via INTRAVENOUS
  Administered 2019-08-24: 30 mg via INTRAVENOUS

## 2019-08-24 SURGICAL SUPPLY — 30 items
BAG DECANTER FOR FLEXI CONT (MISCELLANEOUS) ×3 IMPLANT
BENZOIN TINCTURE PRP APPL 2/3 (GAUZE/BANDAGES/DRESSINGS) ×6 IMPLANT
CANISTER SUCT 3000ML PPV (MISCELLANEOUS) ×3 IMPLANT
CANISTER WOUNDNEG PRESSURE 500 (CANNISTER) ×3 IMPLANT
DRAPE SLUSH/WARMER DISC (DRAPES) ×3 IMPLANT
DRAPE UNIVERSAL PACK (DRAPES) ×3 IMPLANT
DRSG VAC ATS LRG SENSATRAC (GAUZE/BANDAGES/DRESSINGS) ×3 IMPLANT
DRSG VERSA FOAM LRG 10X15 (GAUZE/BANDAGES/DRESSINGS) ×3 IMPLANT
ELECT CAUTERY BLADE 6.4 (BLADE) ×3 IMPLANT
ELECT REM PT RETURN 9FT ADLT (ELECTROSURGICAL) ×6
ELECTRODE REM PT RTRN 9FT ADLT (ELECTROSURGICAL) ×4 IMPLANT
GAUZE SPONGE 4X4 12PLY STRL (GAUZE/BANDAGES/DRESSINGS) ×3 IMPLANT
GOWN STRL REUS W/ TWL LRG LVL3 (GOWN DISPOSABLE) ×6 IMPLANT
GOWN STRL REUS W/TWL LRG LVL3 (GOWN DISPOSABLE) ×9
HANDPIECE INTERPULSE COAX TIP (DISPOSABLE) ×3
KIT BASIN OR (CUSTOM PROCEDURE TRAY) ×3 IMPLANT
KIT TURNOVER KIT B (KITS) ×3 IMPLANT
NS IRRIG 1000ML POUR BTL (IV SOLUTION) ×6 IMPLANT
PACK CHEST (CUSTOM PROCEDURE TRAY) ×3 IMPLANT
PAD ARMBOARD 7.5X6 YLW CONV (MISCELLANEOUS) ×6 IMPLANT
PAD ELECT DEFIB RADIOL ZOLL (MISCELLANEOUS) ×3 IMPLANT
POSITIONER HEAD DONUT 9IN (MISCELLANEOUS) ×3 IMPLANT
SET HNDPC FAN SPRY TIP SCT (DISPOSABLE) ×2 IMPLANT
SUT PROLENE 5 0 C 1 36 (SUTURE) ×3 IMPLANT
SYSTEM SAHARA CHEST DRAIN ATS (WOUND CARE) ×6 IMPLANT
TAPE CLOTH SOFT 2X10 (GAUZE/BANDAGES/DRESSINGS) ×3 IMPLANT
TAPE CLOTH SURG 4X10 WHT LF (GAUZE/BANDAGES/DRESSINGS) ×3 IMPLANT
TOWEL GREEN STERILE (TOWEL DISPOSABLE) ×3 IMPLANT
TOWEL GREEN STERILE FF (TOWEL DISPOSABLE) ×3 IMPLANT
WATER STERILE IRR 1000ML POUR (IV SOLUTION) ×6 IMPLANT

## 2019-08-24 NOTE — Consult Note (Addendum)
[facility] Renal Consultation Note  Requesting MD: Aundra Dubin Indication for Consultation: Acute kidney injury with volume overload   HPI:  [name] is a 74 year old gentleman who was hospitalized on 4/14 with an NSTEMI.  His past medical history is significant for hypertension, diabetes, CAD.  Nephrology has been consulted to help with volume overload in the setting of worsening AKI.  He was admitted on 4/14 with an NSTEMI.  He underwent a CABG on 4/20 which was followed by complications.  On 4/23 he suffered cardiac arrest secondary to tamponade at which time it was necessary for him to have a chest washout.  On 4/26 a second chest washout was necessary.  He has remained intubated and sedated since 4/23.  During his hospitalization, he has been suffering from cardiogenic shock and also required a large volume of fluids from his medications.  Despite aggressive diuretics, he is now about 30 pounds up from admission.  His creatinine has increased to 5.2 today.  Today, nephrology was consulted to help manage fluid status and to consider starting CRRT.  Pt admitted 4/14 with NSTEMI and left heart cath with severe 3 vessel CAD and EF of 30-35%.  He underwent CABG x 4 on 0/09/38 complicated by cardiac arrest due to tamponade and his chest was reopened 08/18/19 and remains open.  He also developed cardiogenic shock and required pressors but is now hypertensive and off of pressors. He has significant anasarca and has developed AKI presumably due to multiple renal insults including IV contrast as well as cardiac arrest and cardiogenic shock leading to ischemic ATN.  We were consulted to further evaluate his AKI and possibly provide CVVHD as he requires large amounts of IVF's related to sedation and antiarrythmic drips.  He has been responding to lasix drip with marked improvement of his UOP but not enough to keep up with his input.  He has also had a steadily rising BUN/Cr since surgery as seen  below.  Christian [name] is a 74 y.o. male  Creatinine, Ser  Date/Time Value Ref Range Status  08/24/2019 04:05 AM 5.27 (H) 0.61 - 1.24 mg/dL Final  08/23/2019 05:52 AM 4.64 (H) 0.61 - 1.24 mg/dL Final  08/22/2019 01:30 PM 4.68 (H) 0.61 - 1.24 mg/dL Final  08/22/2019 04:44 AM 4.55 (H) 0.61 - 1.24 mg/dL Final  08/21/2019 04:06 AM 4.10 (H) 0.61 - 1.24 mg/dL Final  08/20/2019 05:37 PM 4.10 (H) 0.61 - 1.24 mg/dL Final  08/20/2019 03:51 AM 3.19 (H) 0.61 - 1.24 mg/dL Final  08/19/2019 06:20 PM 3.52 (H) 0.61 - 1.24 mg/dL Final  08/19/2019 10:26 AM 3.03 (H) 0.61 - 1.24 mg/dL Final  08/19/2019 03:00 AM 2.97 (H) 0.61 - 1.24 mg/dL Final  08/18/2019 08:02 PM 2.91 (H) 0.61 - 1.24 mg/dL Final  08/18/2019 03:00 PM 2.27 (H) 0.61 - 1.24 mg/dL Final  08/18/2019 03:36 AM 2.03 (H) 0.61 - 1.24 mg/dL Final  08/17/2019 01:48 PM 1.99 (H) 0.61 - 1.24 mg/dL Final  08/17/2019 03:53 AM 1.55 (H) 0.61 - 1.24 mg/dL Final  08/16/2019 03:29 PM 1.31 (H) 0.61 - 1.24 mg/dL Final  08/16/2019 04:13 AM 1.30 (H) 0.61 - 1.24 mg/dL Final  08/15/2019 07:58 PM 1.14 0.61 - 1.24 mg/dL Final  08/15/2019 12:55 PM 0.90 0.61 - 1.24 mg/dL Final  08/15/2019 12:07 PM 0.80 0.61 - 1.24 mg/dL Final  08/15/2019 11:26 AM 0.90 0.61 - 1.24 mg/dL Final  08/15/2019 10:34 AM 1.00 0.61 - 1.24 mg/dL Final  08/15/2019 08:07 AM 1.00 0.61 - 1.24  mg/dL Final  08/15/2019 05:08 AM 1.23 0.61 - 1.24 mg/dL Final  08/14/2019 07:45 PM 1.18 0.61 - 1.24 mg/dL Final  08/13/2019 02:42 AM 1.24 0.61 - 1.24 mg/dL Final  08/10/2019 01:34 PM 1.42 (H) 0.61 - 1.24 mg/dL Final  08/09/2019 11:06 PM 1.60 (H) 0.61 - 1.24 mg/dL Final  08/09/2019 12:40 PM 1.84 (H) 0.61 - 1.24 mg/dL Final  08/09/2019 09:22 AM 1.85 (H) 0.40 - 1.50 mg/dL Final  07/24/2019 03:12 PM 1.22 0.40 - 1.50 mg/dL Final  07/04/2019 11:33 AM 1.52 (H) 0.40 - 1.50 mg/dL Final  06/19/2019 11:30 AM 1.36 (H) 0.76 - 1.27 mg/dL Final  11/23/2018 11:20 AM 1.17 0.40 - 1.50 mg/dL Final  10/19/2017 11:10 AM  1.19 0.40 - 1.50 mg/dL Final  03/29/2017 04:11 PM 1.19 0.40 - 1.50 mg/dL Final  08/27/2016 01:43 PM 1.20 0.40 - 1.50 mg/dL Final  08/13/2015 01:51 PM 1.20 0.40 - 1.50 mg/dL Final  10/09/2014 02:14 PM 1.33 0.40 - 1.50 mg/dL Final  09/20/2014 11:30 AM 1.32 (H) 0.61 - 1.24 mg/dL Final  02/28/2014 03:32 AM 1.04 0.50 - 1.35 mg/dL Final     PMHx:   Past Medical History:  Diagnosis Date  . BPH (benign prostatic hypertrophy)   . Coronary artery disease   . Diabetes mellitus without complication ([place])   . Dry eyes left  . Hiatal hernia   . 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  LIMA TO LAD, RIMA TO PDA, RADIAL ARTERY TO CIRC AND SEQUENTIALLY TO OM1. 08/15/2019  . Hyperlipidemia   . Hypertension   . Incomplete bladder emptying   . Nocturia   . 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 SOB (shortness of breath) on exertion     Past Surgical History:  Procedure Laterality Date  . CARDIAC CATHETERIZATION    . CORONARY ARTERY BYPASS GRAFT N/A 08/15/2019   Procedure: CORONARY ARTERY BYPASS GRAFTING (CABG), x 4 using bilateral IMAs and left radial artery .  LIMA TO LAD, RIMA TO PDA, RADIAL ARTERY TO CIRC AND SEQUENTIALLY TO OM1.;  Surgeon: [name], MD;  Location: [place];  Service: Open Heart Surgery;  Laterality: N/A;  . CORONARY STENT PLACEMENT  02/27/2014   distal rt/pd coronary       dr Einar Gip  . CYSTO/ BLADDER BIOPSY'S/ CAUTHERIZATION  01-14-2004  DR Gaynelle Arabian  . EXPLORATION POST OPERATIVE OPEN HEART N/A 08/16/2019   Procedure: Chest Closure S?P CABG WITH APPLICATION OF PREVENA  INCISIONAL WOUND VAC;  Surgeon: [name], MD;  Location: [facility];  Service: Open Heart Surgery;  Laterality: N/A;  . EXPLORATION POST OPERATIVE OPEN HEART N/A 08/21/2019   Procedure: CHEST WASHOUT S/P OPEN CHEST;  Surgeon: [name], MD;  Location: [place];   Service: Open Heart Surgery;  Laterality: N/A;  Open chest with Esmark dressing with Ioban sealant coverage.  Marland Kitchen LEFT HEART CATH AND CORONARY ANGIOGRAPHY N/A 08/10/2019   Procedure: LEFT HEART CATH AND CORONARY ANGIOGRAPHY;  Surgeon: Nigel Mormon, MD;  Location: [facility];  Service: Cardiovascular;  Laterality: N/A;  . LEFT HEART CATHETERIZATION WITH CORONARY ANGIOGRAM N/A 02/27/2014   Procedure: LEFT HEART CATHETERIZATION WITH CORONARY ANGIOGRAM;  Surgeon: Laverda Page, MD;  Location: [facility] CATH LAB;  Service: Cardiovascular;  Laterality: N/A;  . PERCUTANEOUS CORONARY STENT INTERVENTION (PCI-S)  02/27/2014   Procedure: PERCUTANEOUS CORONARY  STENT INTERVENTION (PCI-S);  Surgeon: Laverda Page, MD;  Location: [facility] CATH LAB;  Service: Cardiovascular;;  rt PDA  3.0/28mm Promus stent  . RADIAL ARTERY HARVEST Left 08/15/2019   Procedure: Radial Artery Harvest;  Surgeon: [name], MD;  Location: [place];  Service: Open Heart Surgery;  Laterality: Left;  . TEE WITHOUT CARDIOVERSION N/A 08/15/2019   Procedure: TRANSESOPHAGEAL ECHOCARDIOGRAM (TEE);  Surgeon: [name], MD;  Location: [place];  Service: Open Heart Surgery;  Laterality: N/A;  . TRANSURETHRAL RESECTION OF PROSTATE  04/04/2012   Procedure: TRANSURETHRAL RESECTION OF THE PROSTATE WITH GYRUS INSTRUMENTS;  Surgeon: Ailene Rud, MD;  Location: [facility];  Service: Urology;  Laterality: N/A;  . TRANSURETHRAL RESECTION OF PROSTATE N/A 09/27/2014   Procedure: TRANSURETHRAL RESECTION OF THE PROSTATE ;  Surgeon: Carolan Clines, MD;  Location: [facility];  Service: Urology;  Laterality: N/A;  . UPPER GASTROINTESTINAL ENDOSCOPY      Family Hx:  Family History  Problem Relation Age of Onset  . Diabetes Mother   . Hypertension Mother   . Diabetes Father   . Diabetes Brother   . Hypertension Brother   . Diabetes Brother     Social History:  reports that he quit smoking about 29 years ago. His  smoking use included cigarettes. He has a 10.00 pack-year smoking history. He has never used smokeless tobacco. He reports current alcohol use. He reports that he does not use drugs.  Allergies: No Known Allergies  Medications: Prior to Admission medications   Medication Sig Start Date End Date Taking? Authorizing Provider  aspirin EC 81 MG tablet Take 81 mg by mouth daily.   Yes [provider]  atorvastatin (LIPITOR) 20 MG tablet TAKE 1 TABLET BY MOUTH EVERY DAY IN THE EVENING Patient taking differently: Take 20 mg by mouth every evening.  06/22/19  Yes Adrian Prows, MD  cloNIDine (CATAPRES) 0.2 MG tablet TAKE 1 TABLET (0.2 MG TOTAL) BY MOUTH DAILY. 06/22/19  Yes Adrian Prows, MD  cromolyn (OPTICROM) 4 % ophthalmic solution Place 1 drop into both eyes 4 (four) times daily as needed (dry eyes).  02/02/17  Yes [provider]  empagliflozin (JARDIANCE) 10 MG TABS tablet Take 10 mg by mouth daily before breakfast. 07/25/19  Yes Marrian Salvage, FNP  furosemide (LASIX) 20 MG tablet TAKE 1 TABLET BY MOUTH EVERY DAY Patient taking differently: Take 20 mg by mouth daily.  08/01/19  Yes Adrian Prows, MD  isosorbide mononitrate (IMDUR) 60 MG 24 hr tablet Take 60 mg by mouth daily.   Yes [provider]  metFORMIN (GLUCOPHAGE) 1000 MG tablet TAKE 1 TABLET TWICE A DAY WITH A MEAL Patient taking differently: Take 1,000 mg by mouth 2 (two) times daily with a meal.  07/31/19  Yes Hoyt Koch, MD  metoprolol succinate (TOPROL-XL) 100 MG 24 hr tablet Take 1 tablet (100 mg total) by mouth daily. Take with or immediately following a meal. 07/24/19  Yes Adrian Prows, MD  nitroGLYCERIN (NITROSTAT) 0.4 MG SL tablet Place 1 tablet (0.4 mg total) under the tongue every 5 (five) minutes as needed for chest pain. 06/01/19  Yes Adrian Prows, MD  olmesartan-hydrochlorothiazide (BENICAR HCT) 40-25 MG tablet TAKE 1 TABLET BY MOUTH EVERY DAY Patient taking differently: Take 1 tablet by mouth daily.   07/03/19  Yes Adrian Prows, MD  PREVIDENT 5000 BOOSTER PLUS 1.1 % PSTE Take 1 application by mouth daily.  06/14/18  Yes [provider]  silodosin (RAPAFLO) 8 MG CAPS capsule Take 8 mg by mouth at bedtime. 07/27/19  Yes [provider]  spironolactone (ALDACTONE) 25 MG tablet Take 1 tablet (25 mg total) by mouth daily. 06/01/19 08/30/19 Yes Adrian Prows, MD  sucralfate (CARAFATE) 1 GM/10ML suspension Take 10 mLs (1 g total) by mouth 4 (four) times daily -  with meals and at bedtime. 07/24/19  Yes Marrian Salvage, FNP  tamsulosin [facility]) 0.4 MG CAPS capsule Take 0.4 mg by mouth daily after breakfast.  05/20/18  Yes [provider]  Trospium Chloride 60 MG CP24 Take 1 capsule by mouth daily. 07/31/19  Yes [provider]  verapamil (VERELAN PM) 240 MG 24 hr capsule TAKE 1 CAPSULE BY MOUTH EVERY DAY Patient taking differently: Take 240 mg by mouth daily.  07/24/19  Yes Adrian Prows, MD    I have reviewed the patient's current medications.  Labs:  BMP Latest Ref Rng & Units 08/24/2019 08/23/2019 08/22/2019  Glucose 70 - 99 mg/dL 204(H) 294(H) 158(H)  BUN 8 - 23 mg/dL 114(H) 90(H) 81(H)  Creatinine 0.61 - 1.24 mg/dL 5.27(H) 4.64(H) 4.68(H)  BUN/Creat Ratio 10 - 24 - - -  Sodium 135 - 145 mmol/L 140 138 140  Potassium 3.5 - 5.1 mmol/L 4.2 4.0 4.0  Chloride 98 - 111 mmol/L 102 103 103  CO2 22 - 32 mmol/L 22 22 23   Calcium 8.9 - 10.3 mg/dL 8.2(L) 7.6(L) 8.4(L)    Urinalysis    Component Value Date/Time   COLORURINE YELLOW 08/14/2019 2140   APPEARANCEUR HAZY (A) 08/14/2019 2140   LABSPEC 1.006 08/14/2019 2140   PHURINE 6.0 08/14/2019 2140   GLUCOSEU 150 (A) 08/14/2019 2140   HGBUR MODERATE (A) 08/14/2019 2140   BILIRUBINUR NEGATIVE 08/14/2019 2140   KETONESUR NEGATIVE 08/14/2019 2140   PROTEINUR NEGATIVE 08/14/2019 2140   NITRITE NEGATIVE 08/14/2019 2140   LEUKOCYTESUR LARGE (A) 08/14/2019 2140     ROS:  Review of systems not obtained due to patient  factors.  Physical Exam: Vitals:   08/24/19 1535 08/24/19 1540  BP: (!) 149/81   Pulse: 86   Resp: 18   Temp:  (!) 96.3 F (35.7 C)  SpO2: 99%      General: General: Ill-appearing elderly man.  Resting in bed, intubated and sedated.  Wound VAC over sternum. Respiratory: Ventilated and sedated.  Good air movement is noted on lateral auscultation bilaterally. Cardiac: Tachycardic, irregular rhythm.  Difficult to palpate radial pulse. Abdomen: Distended, mildly edematous.  Bowel sounds normal. Lower extremities: Significant edema noted diffusely over lower extremities, abdomen, upper extremities.  Assessment/Plan:  AKI: Baseline creatinine 0.9.  2.3 L UOP yesterday.  Creatinine increased today 5.2 up from 4.6 yesterday.  BUN 114 today up from 90 yesterday.  He has likely suffered some ischemic injuries from his cardiac arrest and multiple heart surgeries.  Additionally, the aggressive diuresis necessary to reduce his volume overload is ultimately led to this kidney injury.   His electrolytes are generally well controlled.  At this time, it appears that he would benefit from CRRT to help reduce his volume overload.  Volume overload:  weight on admission 213 pounds.  Weight up to 243 pounds today.  Is roughly 30 pounds up from admission.   He appears to receive about 2-4 liters daily from his medications and only able to diurese 1-2 liters.  We will plan to have dialysis catheter placed so that CRRT can be initiated. Postop acute hypoxic respiratory failure: Currently intubated and sedated.  Management per CCM. Anemia: Hemoglobin down to 8.6 today.  Normocytic.  Is received a total of 4 units RBCs this hospitalization.  Continue to monitor.  Blood transfusions per primary team. Hypertension: MAPs generally in the 70-80 range.  Now off pressors.  Continue management per primary team. CKD stage III:  Baseline serum creatinine 1.2-1.5 baseline most recent labs prior to hospitalization.   Christian [name] 08/24/2019, 3:54 PM   I have seen and examined this patient and agree with plan and assessment in the above note with renal recommendations/intervention highlighted.  Discussed case with heart failure team, who asked CT surgery to place an HD catheter for initiation of CRRT with UF as tolerated.  I explained the plan to his wife who was amenable to the plan. Christian John A Tymia Streb,MD 08/24/2019 5:50 PM

## 2019-08-24 NOTE — Progress Notes (Addendum)
Pharmacy Antibiotic Note  Christian [name] is a 74 y.o. male admitted on 08/09/2019 with open chest.  Pharmacy has been consulted for Vancomycin and Cefepime dosing.  CRRT initiated at 1900PM. Patient also making urine but has not appeared to be filtering.  Vancomycin random level at 18:36PM equal to 16. Will allow CRRT to run for 6hrs then start Vancomycin scheduled and monitor on CRRT.    Plan: Vancomycin 1250mg  IV every 24 hours - starting tomorrow AM. Increase Cefepime to 2g IV every 12 hours. Follow-up toleration of CRRT and Nephrology plans.    Height: 5\' 9"  (175.3 cm) Weight: 110.8 kg (244 lb 4.3 oz) IBW/kg (Calculated) : 70.7  Temp (24hrs), Avg:97.9 F (36.6 C), Min:96.1 F (35.6 C), Max:98.6 F (37 C)  Recent Labs  Lab 08/20/19 0351 08/20/19 0351 08/20/19 1737 08/21/19 0406 08/22/19 0444 08/22/19 1330 08/23/19 0552 08/24/19 0405 08/24/19 1837  WBC 11.1*  --   --  10.4 12.8*  --  11.1* 12.2*  --   CREATININE 3.19*  --    < > 4.10* 4.55* 4.68* 4.64* 5.27*  --   VANCORANDOM 20   < >  --   --   --  22  --   --  16   < > = values in this interval not displayed.    Estimated Creatinine Clearance: 15.5 mL/min (A) (by C-G formula based on SCr of 5.27 mg/dL (H)).    No Known Allergies  Antimicrobials this admission: Vancomycin 4/23 >> Cefepime 4/23 >>  Microbiology results: 4/22 BCx negative 4/19 MRSA/Staph PCR negative 4/14 COVID/Flu negative  Thank you for allowing pharmacy to be a part of this patient's care.  Sloan Leiter, PharmD, BCPS, BCCCP Clinical Pharmacist Please refer to [facility] for [place] numbers 08/24/2019 9:21 PM

## 2019-08-24 NOTE — Transfer of Care (Signed)
Immediate Anesthesia Transfer of Care Note  Patient: Christian [name]  Procedure(s) Performed: CHEST WASHOUT POST OPERATIVE OPEN HEART (N/A Chest) APPLICATION OF WOUND VAC (N/A )  Patient Location: ICU  Anesthesia Type:General  Level of Consciousness: sedated and Patient remains intubated per anesthesia plan  Airway & Oxygen Therapy: Patient remains intubated per anesthesia plan and Patient placed on Ventilator (see vital sign flow sheet for setting)  Post-op Assessment: Report given to RN and Post -op Vital signs reviewed and stable  Post vital signs: Reviewed and stable  Last Vitals:  Vitals Value Taken Time  BP    Temp    Pulse 85 08/24/19 1518  Resp 22 08/24/19 1518  SpO2 100 % 08/24/19 1518  Vitals shown include unvalidated device data.  Last Pain:  Vitals:   08/24/19 1200  TempSrc: Core  PainSc:       Patients Stated Pain Goal: 2 (00/34/91 7915)  Complications: No apparent anesthesia complications

## 2019-08-24 NOTE — Plan of Care (Signed)
  Problem: Clinical Measurements: Goal: Ability to maintain clinical measurements within normal limits will improve Outcome: Progressing Goal: Will remain free from infection Outcome: Progressing Goal: Respiratory complications will improve Outcome: Progressing   Problem: Nutrition: Goal: Adequate nutrition will be maintained Outcome: Progressing   Problem: Coping: Goal: Level of anxiety will decrease Outcome: Progressing   Problem: Elimination: Goal: Will not experience complications related to urinary retention Outcome: Progressing   Problem: Pain Managment: Goal: General experience of comfort will improve Outcome: Progressing   Problem: Safety: Goal: Ability to remain free from injury will improve Outcome: Progressing   Problem: Skin Integrity: Goal: Risk for impaired skin integrity will decrease Outcome: Progressing

## 2019-08-24 NOTE — Op Note (Signed)
Procedure(s): CHEST WASHOUT POST OPERATIVE OPEN HEART APPLICATION OF WOUND VAC Procedure Note  Christian [name] male 74 y.o. 08/24/2019  Procedure(s) and Anesthesia Type: Panel 1:    * CHEST WASHOUT POST OPERATIVE OPEN HEART - General  Panel 2:    * APPLICATION OF WOUND VAC - General  Surgeon(s) and Role: Panel 1:    * [name], MD - Primary Panel 2:    [name], MD - Primary   Indications: The patient is s/p CABG with open chest; he is taken to the OR for chest washout and wound vac placement        Surgeon: [name]   Assistants: staff  Anesthesia: General endotracheal anesthesia  ASA Class: 4    Procedure Detail  CHEST WASHOUT POST OPERATIVE OPEN HEART  The patient was taken to the OR after informed consent was obtained from the patient's wife. He was placed supine and anesthesia was confirmed. The anterior chest was cleansed and draped sterilely. A preoperative pause was performed. The existing membrane was removed and the chest was irrigated copiously. A wound vac was cut to shape to fit the sternal defect and then covered with vac drape. All sponge, instruments, and needle counts were correct.  Estimated Blood Loss:  Minimal         Drains: same          Blood Given: none          Specimens: none               Complications:  * No complications entered in OR log *         Disposition: ICU - intubated and critically ill.         Condition: stable

## 2019-08-24 NOTE — H&P (Signed)
History and Physical Interval Note:  08/24/2019 1:25 PM  Christian [name]  has presented today for surgery, with the diagnosis of OPEN CHEST.  The various methods of treatment have been discussed with the patient and family. After consideration of risks, benefits and other options for treatment, the patient has consented to  Procedure(s) with comments: CHEST WASHOUT S/P OPEN CHEST (N/A) - Open chest with Esmark dressing with Ioban sealant coverage. as a surgical intervention.  The patient's history has been reviewed, patient examined, no change in status, stable for surgery.  I have reviewed the patient's chart and labs.  Questions were answered to the patient's satisfaction.     [name]

## 2019-08-24 NOTE — Progress Notes (Addendum)
Patient ID: Christian [name], male   DOB: 06/24/1945, 74 y.o.   MRN: [id number]     Advanced Heart Failure Rounding Note  PCP-Cardiologist: No primary care provider on file.   Subjective:    08/09/19 Admitted with NSTEM and ? Small RLL PE. Weight 213 pounds.  08/15/19 CABG x4 on 08/15/19 08/18/19 Cardiac arrest due to tamponade. Chest opened for washout 4/26 Chest washout in OR  Yesterday lasix drip restarted. Urine output 2560. Weight down to 244 pounds.   Remains intubated/sedated.   On vanc/cefepime in setting of open chest  -Creatinine up to 5.3  -BUN 114    Echo 4/25: EF 40-45% with some residual pericardial clot.  Swan numbers CVP 10-11  PA 32/16 Thermo CI  2.6  Co-ox 65%   Objective:   Weight Range: 110.8 kg Body mass index is 36.07 kg/m.   Vital Signs:   Temp:  [97.9 F (36.6 C)-98.6 F (37 C)] 98.1 F (36.7 C) (04/29 0700) Pulse Rate:  [87-121] 87 (04/29 0700) Resp:  [16-37] 16 (04/29 0700) BP: (102-160)/(53-77) 128/65 (04/29 0700) SpO2:  [99 %-100 %] 100 % (04/29 0700) Arterial Line BP: (93-168)/(45-73) 130/56 (04/29 0700) FiO2 (%):  [40 %] 40 % (04/29 0353) Weight:  [110.8 kg] 110.8 kg (04/29 0600) Last BM Date: 08/13/19  Weight change: Filed Weights   08/22/19 0420 08/23/19 0500 08/24/19 0600  Weight: 111.7 kg 112.7 kg 110.8 kg    Intake/Output:   Intake/Output Summary (Last 24 hours) at 08/24/2019 0720 Last data filed at 08/24/2019 0700 Gross per 24 hour  Intake 3220.93 ml  Output 2830 ml  Net 390.93 ml      Physical Exam  CVP ~10  General: Remains intubated.  HEENT: ETT  Neck: supple. JVD difficult to assess.  Carotids 2+ bilat; no bruits. No lymphadenopathy or thryomegaly appreciated. Cor: Chest ope with dressing in place. PMI nondisplaced. Regular rate & rhythm. No rubs, gallops or murmurs. Lungs: clear Abdomen: soft, nontender, distended. No hepatosplenomegaly. No bruits or masses. Good bowel sounds. Extremities: no cyanosis,  clubbing, rash, Ted Hose R and LLE 2+ edema Neuro: Opens eyes. Not following commands.   Telemetry   NSR 80s   EKG    N/a   Labs    CBC Recent Labs    08/23/19 0552 08/24/19 0405  WBC 11.1* 12.2*  HGB 8.5* 8.6*  HCT 27.1* 27.2*  MCV 94.4 94.1  PLT 151 354   Basic Metabolic Panel Recent Labs    08/22/19 0444 08/22/19 1330 08/23/19 0552 08/24/19 0405  NA 141   < > 138 140  K 3.7   < > 4.0 4.2  CL 103   < > 103 102  CO2 24   < > 22 22  GLUCOSE 125*   < > 294* 204*  BUN 74*   < > 90* 114*  CREATININE 4.55*   < > 4.64* 5.27*  CALCIUM 8.4*   < > 7.6* 8.2*  MG  --   --   --  2.3  PHOS 6.8*  --  6.7*  --    < > = values in this interval not displayed.   Liver Function Tests Recent Labs    08/23/19 0552 08/24/19 0405  AST 50* 59*  ALT 220* 169*  ALKPHOS 82 115  BILITOT 0.8 0.7  PROT 4.2* 4.3*  ALBUMIN 1.5* 1.6*   No results for input(s): LIPASE, AMYLASE in the last 72 hours. Cardiac Enzymes No results for input(s): CKTOTAL, CKMB,  CKMBINDEX, TROPONINI in the last 72 hours.  BNP: BNP (last 3 results) Recent Labs    08/09/19 1240  BNP 89.1    ProBNP (last 3 results) Recent Labs    11/23/18 1120 08/09/19 0922  PROBNP 18.0 78.0     D-Dimer No results for input(s): DDIMER in the last 72 hours. Hemoglobin A1C No results for input(s): HGBA1C in the last 72 hours. Fasting Lipid Panel Recent Labs    08/23/19 0552  TRIG 213*   Thyroid Function Tests No results for input(s): TSH, T4TOTAL, T3FREE, THYROIDAB in the last 72 hours.  Invalid input(s): FREET3  Other results:   Imaging    No results found.   Medications:     Scheduled Medications: . aspirin  81 mg Per Tube Daily  . atorvastatin  40 mg Per Tube Daily  . bisacodyl  10 mg Oral Daily   Or  . bisacodyl  10 mg Rectal Daily  . chlorhexidine gluconate (MEDLINE KIT)  15 mL Mouth Rinse BID  . Chlorhexidine Gluconate Cloth  6 each Topical Daily  . clopidogrel  75 mg Per Tube  Daily  . docusate  100 mg Per Tube BID  . feeding supplement (PRO-STAT SUGAR FREE 64)  30 mL Per Tube QID  . fentaNYL (SUBLIMAZE) injection  25 mcg Intravenous Once  . hydrALAZINE  25 mg Per Tube Q8H  . insulin aspart  0-24 Units Subcutaneous Q4H  . isosorbide dinitrate  20 mg Per Tube TID  . mouth rinse  15 mL Mouth Rinse 10 times per day  . pantoprazole sodium  40 mg Per Tube Daily  . sodium chloride flush  10-40 mL Intracatheter Q12H  . sodium chloride flush  3 mL Intravenous Q12H  . tamsulosin  0.4 mg Oral QPC breakfast  . trospium  20 mg Per Tube BID  . vancomycin variable dose per unstable renal function (pharmacist dosing)   Does not apply See admin instructions    Infusions: . sodium chloride 10 mL/hr at 08/24/19 0700  . sodium chloride 20 mL/hr at 08/23/19 0600  . sodium chloride Stopped (08/22/19 1830)  . amiodarone 30 mg/hr (08/24/19 0700)  . ceFEPime (MAXIPIME) IV Stopped (08/24/19 0029)  . epinephrine Stopped (08/21/19 1531)  . feeding supplement (VITAL AF 1.2 CAL) Stopped (08/24/19 0500)  . fentaNYL infusion INTRAVENOUS 150 mcg/hr (08/24/19 0700)  . furosemide (LASIX) infusion 8 mg/hr (08/24/19 0700)  . lactated ringers    . lactated ringers    . lactated ringers    . lactated ringers 20 mL/hr at 08/20/19 1200  . magnesium sulfate bolus IVPB    . midazolam 0.5 mg/hr (08/24/19 0700)  . norepinephrine (LEVOPHED) Adult infusion Stopped (08/22/19 1828)  . propofol (DIPRIVAN) infusion 15 mcg/kg/min (08/24/19 0700)  . sodium bicarbonate 150 mEq in dextrose 5% 1000 mL Stopped (08/19/19 0707)    PRN Medications: sodium chloride, cromolyn, dextrose, fentaNYL, hydrALAZINE, lactated ringers, midazolam, morphine injection, ondansetron (ZOFRAN) IV, oxyCODONE, sodium chloride flush, sodium chloride flush, traMADol     Assessment/Plan   1. CAD: s/p PDA angioplasty + stent in 02/2014.  Admitted for NSTEMI 4/14. Hs Trop peaked at 3,309.  LHC w/ severe 3V CAD. Echo w/ reduced  EF 30-35%. RV ok.  S/p CABG x 4 on 4/20 (LIMA-LAD, RIMA to PDA, Lt radial artery graft to 1st OM and distal OM branches of LCx).  Chest reopened urgently at the bedside for cardiac tamponade and removal of clot on 4/23  - continue ASA/Plavix.  -  Radial harvest. On isordil 20 tid.  - Atorvastatin 40 daily.   - Chest closure per Dr. Orvan Seen 2. Acute Systolic Heart Failure: Ischemic Cardiomyopathy -> cardiogenic shock.  LVEF severely reduced at 30-35%, in the setting of NSTEM/ Multivessel CAD, RV ok initially.  Underwent urgent sternotomy at the bedside for cardiac tamponade and removal of clot on 4/23. Chest remains open. Luiz Blare numbers have been concerning for RV failure.  Echo 4/24 EF 40 to 45% with inotropic support.  Small amount of residual pericardial clot.  RV moderately hypokinetic .  - Off pressors. CO-OX stable.  - CVP 10-11, still edematous. Making >2.5 liters urine. Continue lasix drip at 8 mg/hr. - Remains on isordil, added hydralazine 25 mg tid for afterload reduction with elevated BP.   3.  Postoperative acute hypoxic respiratory failure: Reintubated on 4/23 due to cardiogenic shock and return to OR. - Keep intubated/sedated while chest open.  4. AKI: Baseline SCr 0.9. Creatinine up to 5.3 today, suspect ATN in setting of cardiogenic shock/tamponade.  Making > 2.5 liters urine. - Continue to follow, concern he may need CVVH with significantly rising BUN/creatinine but making urine reasonably well.  5.  Shock liver: Due to cardiac arrest on 4/23.  LFTs trending down.  6. 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 Maintaining NSR.   - Stop amio drip and switch to amio 200 mg twice a day.  - Off heparin currently with open chest and recent tamponade, will need to address long-term plan.  If we are going to anticoagulate, will need to stop Plavix.  7.  Possible small right lower lobe PE: Chest CT 4/14 w/ small RLL PE.  Bilateral Venous Dopplers negative for DVT.  - Off heparin currently  with open chest and recent tamponade, restart low dose heparin when stable per TCTS.  - Anticipate switching eliquis prior to d/c.  8. T2DM: Hgb A1c 7.7 -Remains on insulin 9. Pericardial tamponade: 4/23 return to OR for clot removal, chest wall left open. Patient had been on heparin gtt for DCCV of atrial flutter.  - Timing of chest closure per surgery.   Consult nephrology.   Length of Stay: [place], NP  08/24/2019, 7:20 AM  Advanced Heart Failure Team Pager [phone] (M-F; [place])  Please contact [facility] for night-coverage after hours (4p -7a ) and weekends on [email or web address]  Patient seen with NP, agree with the above note.   Good UOP yesterday, weight coming down.  Stable co-ox and Swan CI today but BUN/creatinine continue to rise.  CVP 10-11.   General: Sedated on vent.  Neck: JVP 10 cm, no thyromegaly or thyroid nodule.  Lungs: Decreased at bases.  CV: Nondisplaced PMI.  Heart regular S1/S2, no S3/S4, no murmur.  1+ edema to knees   Abdomen: Soft, nontender, no hepatosplenomegaly, no distention.  Skin: Intact without lesions or rashes.  Neurologic: Not following commands today. Extremities: No clubbing or cyanosis.  HEENT: Normal.   He is off pressors/inotropes with good cardiac output and blood pressure.  Still edematous with CVP 10-11.  He is making urine well on Lasix gtt at 8 mg/hr.  - Continue Lasix 8 mg/hr today.  - Hydralazine/Isordil to continue.   BUN/creatinine steadily rising.  Suspect ATN from cardiogenic shock/pericardial tamponade. Hopefully will plateau but concerned he may end up needing CVVH.  MAP and CO stable.   Not responsive today but on sedation, will need to wean and follow neuro exam.   Needs chest closure,  timing per surgery.   Continue antibiotics while chest is open, portable CXR today.   CRITICAL CARE Performed by: Loralie Champagne  Total critical care time: 35 minutes  Critical care time was exclusive of separately billable  procedures and treating other patients.  Critical care was necessary to treat or prevent imminent or life-threatening deterioration.  Critical care was time spent personally by me on the following activities: development of treatment plan with patient and/or surrogate as well as nursing, discussions with consultants, evaluation of patient's response to treatment, examination of patient, obtaining history from patient or surrogate, ordering and performing treatments and interventions, ordering and review of laboratory studies, ordering and review of radiographic studies, pulse oximetry and re-evaluation of patient's condition.  Loralie Champagne 08/24/2019 7:48 AM

## 2019-08-24 NOTE — Progress Notes (Signed)
TCTS BRIEF SICU PROGRESS NOTE  Day of Surgery  S/P Procedure(s) (LRB): CHEST WASHOUT POST OPERATIVE OPEN HEART (N/A) APPLICATION OF WOUND VAC (N/A)   Sedated on vent NSR w/ PAC's, stable hemodynamics UOP excellent on lasix drip Now starting CRRT  Plan: Continue current plan  Rexene Alberts, MD 08/24/2019 7:23 PM

## 2019-08-24 NOTE — OR Nursing (Signed)
On 08/18/2019, the Operating Room was notified that there was going to be an emergent chest exploration in the patient's room on 2 Heart due to a Code Blue. When I arrived to the patient's room a few minutes later, Dr. Orvan Seen was in the process of opening the chest in a sterile manner. I scrubbed in to assist him with this procedure and therefore was not able to complete any documentation on the procedure at the time. Documentation was completed on this procedure on 08/23/2021.

## 2019-08-24 NOTE — Procedures (Signed)
Central Venous Catheter Insertion Procedure Note Christian [name] [id number] August 19, 1945  Procedure: Insertion of Central Venous Catheter Indications: need for dialysis access  Procedure Details Consent: Risks of procedure as well as the alternatives and risks of each were explained to the (patient/caregiver).  Consent for procedure obtained. Time Out: Verified patient identification, verified procedure, site/side was marked, verified correct patient position, special equipment/implants available, medications/allergies/relevent history reviewed, required imaging and test results available.  Performed  Maximum sterile technique was used including antiseptics, cap, gloves, gown, hand hygiene, mask and sheet. Skin prep: Chlorhexidine; local anesthetic administered A antimicrobial bonded/coated triple lumen catheter was placed in the right subclavian vein using the Seldinger technique.  Evaluation Blood flow good Complications: No apparent complications Patient did tolerate procedure well. Chest X-ray ordered to verify placement.  CXR: normal.  Christian [name] Christian [name] 08/24/2019, 5:54 PM

## 2019-08-25 ENCOUNTER — Inpatient Hospital Stay (HOSPITAL_COMMUNITY): Payer: Medicare Other

## 2019-08-25 ENCOUNTER — Other Ambulatory Visit: Payer: Self-pay | Admitting: Family Medicine

## 2019-08-25 DIAGNOSIS — N179 Acute kidney failure, unspecified: Secondary | ICD-10-CM | POA: Diagnosis not present

## 2019-08-25 LAB — RENAL FUNCTION PANEL
Albumin: 1.8 g/dL — ABNORMAL LOW (ref 3.5–5.0)
Albumin: 2 g/dL — ABNORMAL LOW (ref 3.5–5.0)
Anion gap: 13 (ref 5–15)
Anion gap: 14 (ref 5–15)
BUN: 93 mg/dL — ABNORMAL HIGH (ref 8–23)
BUN: 74 mg/dL — ABNORMAL HIGH (ref 8–23)
CO2: 23 mmol/L (ref 22–32)
CO2: 23 mmol/L (ref 22–32)
Calcium: 8.4 mg/dL — ABNORMAL LOW (ref 8.9–10.3)
Calcium: 8.5 mg/dL — ABNORMAL LOW (ref 8.9–10.3)
Chloride: 102 mmol/L (ref 98–111)
Chloride: 100 mmol/L (ref 98–111)
Creatinine, Ser: 3.76 mg/dL — ABNORMAL HIGH (ref 0.61–1.24)
Creatinine, Ser: 3.01 mg/dL — ABNORMAL HIGH (ref 0.61–1.24)
GFR calc Af Amer: 17 mL/min — ABNORMAL LOW (ref >60)
GFR calc Af Amer: 23 mL/min — ABNORMAL LOW (ref >60)
GFR calc non Af Amer: 15 mL/min — ABNORMAL LOW (ref >60)
GFR calc non Af Amer: 20 mL/min — ABNORMAL LOW (ref >60)
Glucose, Bld: 171 mg/dL — ABNORMAL HIGH (ref 70–99)
Glucose, Bld: 166 mg/dL — ABNORMAL HIGH (ref 70–99)
Phosphorus: 6.2 mg/dL — ABNORMAL HIGH (ref 2.5–4.6)
Phosphorus: 4.9 mg/dL — ABNORMAL HIGH (ref 2.5–4.6)
Potassium: 4.5 mmol/L (ref 3.5–5.1)
Potassium: 4.7 mmol/L (ref 3.5–5.1)
Sodium: 138 mmol/L (ref 135–145)
Sodium: 137 mmol/L (ref 135–145)

## 2019-08-25 LAB — CBC
HCT: 29.2 % — ABNORMAL LOW (ref 39.0–52.0)
Hemoglobin: 9.3 g/dL — ABNORMAL LOW (ref 13.0–17.0)
MCH: 30.1 pg (ref 26.0–34.0)
MCHC: 31.8 g/dL (ref 30.0–36.0)
MCV: 94.5 fL (ref 80.0–100.0)
Platelets: 215 10*3/uL (ref 150–400)
RBC: 3.09 MIL/uL — ABNORMAL LOW (ref 4.22–5.81)
RDW: 17.2 % — ABNORMAL HIGH (ref 11.5–15.5)
WBC: 13.5 10*3/uL — ABNORMAL HIGH (ref 4.0–10.5)
nRBC: 0 % (ref 0.0–0.2)

## 2019-08-25 LAB — COOXEMETRY PANEL
Carboxyhemoglobin: 0.8 % (ref 0.5–1.5)
Methemoglobin: 0.8 % (ref 0.0–1.5)
O2 Saturation: 56.4 %
Total hemoglobin: 9.7 g/dL — ABNORMAL LOW (ref 12.0–16.0)

## 2019-08-25 LAB — GLUCOSE, CAPILLARY
Glucose-Capillary: 160 mg/dL — ABNORMAL HIGH (ref 70–99)
Glucose-Capillary: 147 mg/dL — ABNORMAL HIGH (ref 70–99)
Glucose-Capillary: 151 mg/dL — ABNORMAL HIGH (ref 70–99)
Glucose-Capillary: 150 mg/dL — ABNORMAL HIGH (ref 70–99)
Glucose-Capillary: 126 mg/dL — ABNORMAL HIGH (ref 70–99)
Glucose-Capillary: 167 mg/dL — ABNORMAL HIGH (ref 70–99)
Glucose-Capillary: 158 mg/dL — ABNORMAL HIGH (ref 70–99)

## 2019-08-25 LAB — MAGNESIUM: Magnesium: 2.4 mg/dL (ref 1.7–2.4)

## 2019-08-25 MED ORDER — B COMPLEX-C PO TABS
1.0000 | ORAL_TABLET | Freq: Every day | ORAL | Status: DC
  Administered 2019-08-25 – 2019-09-14 (×18): 1
  Filled 2019-08-25 (×18): qty 1

## 2019-08-25 MED ORDER — MAGNESIUM HYDROXIDE 400 MG/5ML PO SUSP
30.0000 mL | Freq: Every day | ORAL | Status: DC
  Administered 2019-08-25: 30 mL
  Filled 2019-08-25: qty 30

## 2019-08-25 MED ORDER — VITAL AF 1.2 CAL PO LIQD
1000.0000 mL | ORAL | Status: DC
  Administered 2019-08-25 – 2019-08-28 (×3): 1000 mL

## 2019-08-25 MED ORDER — HEPARIN (PORCINE) 25000 UT/250ML-% IV SOLN
1000.0000 [IU]/h | INTRAVENOUS | Status: DC
  Administered 2019-08-25: 500 [IU]/h via INTRAVENOUS
  Administered 2019-08-28 – 2019-08-31 (×3): 850 [IU]/h via INTRAVENOUS
  Filled 2019-08-25 (×7): qty 250

## 2019-08-25 NOTE — Progress Notes (Signed)
[facility] Progress Note    Assessment/ Plan:   AKI: Baseline creatinine 0.9.  2.9 L urine output yesterday.  Lasix was stopped and CRRT initiated.  Blood work this morning shows creatinine of 3.7, BUN 93.  Overall, he appears to be tolerating CRRT well without any issue with hypotension.  No significant electrolyte abnormalities. Volume overload:  weight on admission 97 kg.    Today's weight 109.9 kg net -3.3 L yesterday with 2.9 L urine output in addition to 2.2 L off with CRRT.  Tolerating CRRT well.  Now attempting to pull 150 cc/h if blood pressure can tolerate it. Postop acute hypoxic respiratory failure: Currently intubated and sedated.  Management per CCM. Anemia: Hemoglobin down to 8.6 today.  Normocytic.  Is received a total of 4 units RBCs this hospitalization.  Continue to monitor.  Blood transfusions per primary team. Hypertension:  No hypotension noted in the past 24 hours with initiation of CRRT.  Currently off pressors.  Continue management per primary team.  CKD stage III:  Baseline serum creatinine 1.2-1.5 based on most recent labs prior to hospitalization.  ROS: Unable to obtain due intubation and sedation  Subjective:   Catheter placed and dialysis initiated yesterday.  Christian [name] has been tolerating CRRT well without any issue with hypotension.  There have been no significant events overnight.   Objective:   BP (!) 150/79   Pulse (!) 103   Temp (!) 96.6 F (35.9 C)   Resp (!) 24   Ht _0  (1.753 m)   Wt 109.9 kg   SpO2 100%   BMI 35.78 kg/m   Intake/Output Summary (Last 24 hours) at 08/25/2019 1122 Last data filed at 08/25/2019 1100 Gross per 24 hour  Intake 2548.44 ml  Output 6100 ml  Net -3551.56 ml   Weight change: -0.9 kg  Physical Exam: General: Ill-appearing elderly man.  Intubated and sedated.  Open chest wound covered with a wound VAC.  Anasarca. Cardiac: Tachycardia.  Regular rhythm Respiratory: Intubated and sedated.  Good air  movement bilaterally. Abdomen: Protuberant, soft, bowel sounds normal. Extremities: Significant edema noted in all extremities.  Imaging: DG Chest 1 View  Result Date: 08/24/2019 CLINICAL DATA:  Possible right pneumothorax EXAM: CHEST  1 VIEW COMPARISON:  08/24/2019, 08/22/2019, 08/20/2019 FINDINGS: Endotracheal tube tip approximately 1.9 cm superior to the carina. Esophageal tube tip below the diaphragm but incompletely visualized. Right IJ Swan-Ganz catheter tip projects over pulmonary outflow. New right-sided subclavian central venous catheter with tip over the cavoatrial region. No definitive right pneumothorax is seen. Bilateral chest drainage catheters and mediastinal drainage catheters. Slightly more curled appearance of the distal right chest tube. Similar cardiomegaly, left pleural effusion and left basilar airspace disease. There is probable trace right pleural effusion. Right first rib fracture. IMPRESSION: 1. New right subclavian central venous catheter with tip projecting over the cavoatrial region. No pneumothorax is seen. 2. Cardiomegaly with small left effusion and left basilar airspace disease without significant change. There is probable trace right pleural effusion Electronically Signed   By: Donavan Foil M.D.   On: 08/24/2019 18:25   DG CHEST PORT 1 VIEW  Result Date: 08/25/2019 CLINICAL DATA:  Status post CABG. EXAM: PORTABLE CHEST 1 VIEW COMPARISON:  08/24/2019 and prior radiographs FINDINGS: Cardiomegaly and CABG changes are again noted. An endotracheal tube with tip 3 cm above the carina, RIGHT subclavian central venous catheter with tip overlying the LOWER SVC, small bore feeding tube entering the stomach with tip off field  of view and mediastinal and thoracostomy tubes again noted. A RIGHT IJ since Swan-Ganz catheter has been removed with sheath remaining. There is no evidence of pneumothorax. Bibasilar atelectasis again noted, LEFT-greater-than-RIGHT. IMPRESSION: Unchanged  appearance of the chest except for removal of RIGHT IJ Swan-Ganz catheter. Electronically Signed   By: Margarette Canada M.D.   On: 08/25/2019 09:12     Labs: BMET Recent Labs  Lab 08/18/19 1500 08/18/19 1543 08/20/19 1737 08/21/19 0406 08/21/19 1720 08/22/19 0444 08/22/19 1330 08/23/19 0552 08/24/19 0405 08/25/19 0326  NA 138   < > 143 141  --  141 140 138 140 138  K 3.8   < > 4.3 4.0  --  3.7 4.0 4.0 4.2 4.5  CL 108   < > 107 106  --  103 103 103 102 102  CO2 18*   < > 23 24  --  _0 GLUCOSE 131*   < > 154* 216*  --  125* 158* 294* 204* 171*  BUN 21   < > 54* 60*  --  74* 81* 90* 114* 93*  CREATININE 2.27*   < > 4.10* 4.10*  --  4.55* 4.68* 4.64* 5.27* 3.76*  CALCIUM 7.9*   < > 7.8* 7.6*  --  8.4* 8.4* 7.6* 8.2* 8.4*  PHOS 3.3  --   --   --  5.6* 6.8*  --  6.7*  --  6.2*   < > = values in this interval not displayed.   CBC Recent Labs  Lab 08/22/19 0444 08/23/19 0552 08/24/19 0405 08/25/19 0326  WBC 12.8* 11.1* 12.2* 13.5*  HGB 10.8* 8.5* 8.6* 9.3*  HCT 33.6* 27.1* 27.2* 29.2*  MCV 91.8 94.4 94.1 94.5  PLT 175 151 171 215    Medications:    . sodium chloride   Intravenous Once  . sodium chloride   Intravenous Once  . amiodarone  200 mg Oral BID  . aspirin  81 mg Per Tube Daily  . atorvastatin  40 mg Per Tube Daily  . bisacodyl  10 mg Oral Daily   Or  . bisacodyl  10 mg Rectal Daily  . chlorhexidine gluconate (MEDLINE KIT)  15 mL Mouth Rinse BID  . Chlorhexidine Gluconate Cloth  6 each Topical Daily  . clopidogrel  75 mg Per Tube Daily  . docusate  100 mg Per Tube BID  . feeding supplement (PRO-STAT SUGAR FREE 64)  30 mL Per Tube QID  . fentaNYL (SUBLIMAZE) injection  25 mcg Intravenous Once  . hydrALAZINE  25 mg Per Tube Q8H  . insulin aspart  0-24 Units Subcutaneous Q4H  . isosorbide dinitrate  20 mg Per Tube TID  . magnesium hydroxide  30 mL Per Tube Daily  . mouth rinse  15 mL Mouth Rinse 10 times per day  . pantoprazole sodium  40 mg Per  Tube Daily  . sodium chloride flush  10-40 mL Intracatheter Q12H  . sodium chloride flush  3 mL Intravenous Q12H  . tamsulosin  0.4 mg Oral QPC breakfast  . trospium  20 mg Per Tube BID     Matilde Haymaker, MD [place] Resident, PGY2 08/25/2019, 11:22 AM

## 2019-08-25 NOTE — Progress Notes (Addendum)
Patient ID: Christian [name], male   DOB: [birth year], 74 y.o.   MRN: [id number]     Advanced Heart Failure Rounding Note  PCP-Cardiologist: No primary care provider on file.   Subjective:    08/09/19 Admitted with NSTEM and ? Small RLL PE. Weight 213 pounds.  08/15/19 CABG x4 on 08/15/19 08/18/19 Cardiac arrest due to tamponade. Chest opened for washout 4/26 Chest washout in OR   Remains intubated and sedated. FiO2 40%.    He is off pressors. Co-ox marginal at 56% but CI prior to PA cath removal was 3.1. MAPs in the 90s   Has an AKI. Suspect ATN from cardiogenic shock/pericardial tamponade. CVVHD initiated 4/29 to assist w/ volume removal. He is still making good urine. -3L in UOP yesterday. SCr/BUN improving. CVP 11. Still ~30 lb above his dry wt.   On vanc/cefepime in setting of open chest. AF but WBC trending up slightly, 11>12>13.  Echo 4/25: EF 40-45% with some residual pericardial clot.  Swan numbers earlier this AM (PA cath now removed) CVP 11 PA 34/20 Thermo CI  3.1 CO 6.6 Co-ox 56%   Objective:   Weight Range: 109.9 kg Body mass index is 35.78 kg/m.   Vital Signs:   Temp:  [95.4 F (35.2 C)-97.7 F (36.5 C)] 96.6 F (35.9 C) (04/30 0730) Pulse Rate:  [37-147] 95 (04/30 0800) Resp:  [17-28] 21 (04/30 0800) BP: (103-155)/(53-87) 150/79 (04/30 0800) SpO2:  [99 %-100 %] 100 % (04/30 0800) Arterial Line BP: (114-193)/(43-85) 162/64 (04/30 0800) FiO2 (%):  [40 %] 40 % (04/30 0800) Weight:  [109.9 kg] 109.9 kg (04/30 0350) Last BM Date: 08/13/19  Weight change: Filed Weights   08/23/19 0500 08/24/19 0600 08/25/19 0350  Weight: 112.7 kg 110.8 kg 109.9 kg    Intake/Output:   Intake/Output Summary (Last 24 hours) at 08/25/2019 0905 Last data filed at 08/25/2019 0900 Gross per 24 hour  Intake 2271.36 ml  Output 5537 ml  Net -3265.64 ml      Physical Exam  CVP 11 General: critically ill, intubated and sedate HEENT: + ETT, + NGT  Neck: supple. JVD  difficult to assess, thick neck.  Carotids 2+ bilat; no bruits. No lymphadenopathy or thryomegaly appreciated. Cor: open chest w/ wound vac in place + CTs PMI nondisplaced. Regular rate & rhythm. No rubs, gallops or murmurs. Lungs: intubated and clear Abdomen: soft, nontender, distended. No hepatosplenomegaly. No bruits or masses. Good bowel sounds. Extremities: no cyanosis, clubbing, rash, extremities edematous  Neuro: intubated and sedated   Telemetry   NSR 90s   EKG    N/a   Labs    CBC Recent Labs    08/24/19 0405 08/25/19 0326  WBC 12.2* 13.5*  HGB 8.6* 9.3*  HCT 27.2* 29.2*  MCV 94.1 94.5  PLT 171 080   Basic Metabolic Panel Recent Labs    08/23/19 0552 08/23/19 0552 08/24/19 0405 08/25/19 0326  NA 138   < > 140 138  K 4.0   < > 4.2 4.5  CL 103   < > 102 102  CO2 22   < > 22 23  GLUCOSE 294*   < > 204* 171*  BUN 90*   < > 114* 93*  CREATININE 4.64*   < > 5.27* 3.76*  CALCIUM 7.6*   < > 8.2* 8.4*  MG  --   --  2.3 2.4  PHOS 6.7*  --   --  6.2*   < > = values in this  interval not displayed.   Liver Function Tests Recent Labs    08/23/19 0552 08/23/19 0552 08/24/19 0405 08/25/19 0326  AST 50*  --  59*  --   ALT 220*  --  169*  --   ALKPHOS 82  --  115  --   BILITOT 0.8  --  0.7  --   PROT 4.2*  --  4.3*  --   ALBUMIN 1.5*   < > 1.6* 1.8*   < > = values in this interval not displayed.   No results for input(s): LIPASE, AMYLASE in the last 72 hours. Cardiac Enzymes No results for input(s): CKTOTAL, CKMB, CKMBINDEX, TROPONINI in the last 72 hours.  BNP: BNP (last 3 results) Recent Labs    08/09/19 1240  BNP 89.1    ProBNP (last 3 results) Recent Labs    11/23/18 1120 08/09/19 0922  PROBNP 18.0 78.0     D-Dimer No results for input(s): DDIMER in the last 72 hours. Hemoglobin A1C No results for input(s): HGBA1C in the last 72 hours. Fasting Lipid Panel Recent Labs    08/24/19 2003  TRIG 117   Thyroid Function Tests No results  for input(s): TSH, T4TOTAL, T3FREE, THYROIDAB in the last 72 hours.  Invalid input(s): FREET3  Other results:   Imaging    DG Chest 1 View  Result Date: 08/24/2019 CLINICAL DATA:  Possible right pneumothorax EXAM: CHEST  1 VIEW COMPARISON:  08/24/2019, 08/22/2019, 08/20/2019 FINDINGS: Endotracheal tube tip approximately 1.9 cm superior to the carina. Esophageal tube tip below the diaphragm but incompletely visualized. Right IJ Swan-Ganz catheter tip projects over pulmonary outflow. New right-sided subclavian central venous catheter with tip over the cavoatrial region. No definitive right pneumothorax is seen. Bilateral chest drainage catheters and mediastinal drainage catheters. Slightly more curled appearance of the distal right chest tube. Similar cardiomegaly, left pleural effusion and left basilar airspace disease. There is probable trace right pleural effusion. Right first rib fracture. IMPRESSION: 1. New right subclavian central venous catheter with tip projecting over the cavoatrial region. No pneumothorax is seen. 2. Cardiomegaly with small left effusion and left basilar airspace disease without significant change. There is probable trace right pleural effusion Electronically Signed   By: Donavan Foil M.D.   On: 08/24/2019 18:25     Medications:     Scheduled Medications: . sodium chloride   Intravenous Once  . sodium chloride   Intravenous Once  . amiodarone  200 mg Oral BID  . aspirin  81 mg Per Tube Daily  . atorvastatin  40 mg Per Tube Daily  . bisacodyl  10 mg Oral Daily   Or  . bisacodyl  10 mg Rectal Daily  . chlorhexidine gluconate (MEDLINE KIT)  15 mL Mouth Rinse BID  . Chlorhexidine Gluconate Cloth  6 each Topical Daily  . clopidogrel  75 mg Per Tube Daily  . docusate  100 mg Per Tube BID  . feeding supplement (PRO-STAT SUGAR FREE 64)  30 mL Per Tube QID  . fentaNYL (SUBLIMAZE) injection  25 mcg Intravenous Once  . hydrALAZINE  25 mg Per Tube Q8H  . insulin  aspart  0-24 Units Subcutaneous Q4H  . isosorbide dinitrate  20 mg Per Tube TID  . magnesium hydroxide  30 mL Per Tube Daily  . mouth rinse  15 mL Mouth Rinse 10 times per day  . pantoprazole sodium  40 mg Per Tube Daily  . sodium chloride flush  10-40 mL Intracatheter Q12H  .  sodium chloride flush  3 mL Intravenous Q12H  . tamsulosin  0.4 mg Oral QPC breakfast  . trospium  20 mg Per Tube BID    Infusions: .  prismasol BGK 4/2.5 500 mL/hr at 08/25/19 0505  .  prismasol BGK 4/2.5 1 each (08/24/19 1841)  . sodium chloride Stopped (08/25/19 0743)  . sodium chloride 20 mL/hr at 08/25/19 0900  . sodium chloride Stopped (08/22/19 1830)  . ceFEPime (MAXIPIME) IV Stopped (08/25/19 0003)  . epinephrine Stopped (08/21/19 1531)  . feeding supplement (VITAL AF 1.2 CAL) 45 mL/hr at 08/25/19 0900  . fentaNYL infusion INTRAVENOUS 150 mcg/hr (08/25/19 0900)  . lactated ringers    . lactated ringers    . lactated ringers    . lactated ringers 20 mL/hr at 08/20/19 1200  . magnesium sulfate bolus IVPB    . midazolam 2 mg/hr (08/25/19 0900)  . prismasol BGK 4/2.5 1,500 mL/hr at 08/25/19 0900  . propofol (DIPRIVAN) infusion 10 mcg/kg/min (08/25/19 0900)  . sodium bicarbonate 150 mEq in dextrose 5% 1000 mL Stopped (08/19/19 0707)  . vancomycin Stopped (08/25/19 0248)    PRN Medications: sodium chloride, cromolyn, dextrose, fentaNYL, heparin, heparin, hydrALAZINE, lactated ringers, midazolam, morphine injection, ondansetron (ZOFRAN) IV, oxyCODONE, sodium chloride flush, sodium chloride flush, traMADol     Assessment/Plan   1. CAD: s/p PDA angioplasty + stent in 02/2014.  Admitted for NSTEMI 4/14. Hs Trop peaked at 3,309.  LHC w/ severe 3V CAD. Echo w/ reduced EF 30-35%. RV ok.  S/p CABG x 4 on 4/20 (LIMA-LAD, RIMA to PDA, Lt radial artery graft to 1st OM and distal OM branches of LCx).  Chest reopened urgently at the bedside for cardiac tamponade and removal of clot on 4/23  - continue ASA/Plavix.   - Radial harvest. On isordil 20 tid.  - Atorvastatin 40 daily.   - Chest closure per Dr. Orvan Seen 2. Acute Systolic Heart Failure: Ischemic Cardiomyopathy -> cardiogenic shock.  LVEF severely reduced at 30-35%, in the setting of NSTEM/ Multivessel CAD, RV ok initially.  Underwent urgent sternotomy at the bedside for cardiac tamponade and removal of clot on 4/23. Chest remains open. Luiz Blare numbers have been concerning for RV failure.  Echo 4/24 EF 40 to 45% with inotropic support.  Small amount of residual pericardial clot.  RV moderately hypokinetic .  - Off pressors. CO-OX marginal at 56%. CI however was 3.1 today.    - remains volume overloaded, CVP 11. Making ~3 liters urine but started on CVVHD to assist w/ volume removal - Remains on isordil, added hydralazine 25 mg tid for afterload reduction with elevated BP.   3.  Postoperative acute hypoxic respiratory failure: Reintubated on 4/23 due to cardiogenic shock and return to OR. - Keep intubated/sedated while chest open.  4. AKI: Baseline SCr 0.9. Creatinine peaked to 5.3, suspect ATN in setting of cardiogenic shock/tamponade.  Making > 2.5 liters urine. SCr improving w/ volume removal/CVVHD, down to 3.76 today  - Continue to follow, appreciate nephrology's assistance. 5.  Shock liver: Due to cardiac arrest on 4/23.  LFTs trending down.  6. 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 Maintaining NSR.   - continue PO amio 200 mg twice a day.  - Off heparin currently with open chest and recent tamponade, will need to address long-term plan.  If we are going to anticoagulate, will need to stop Plavix.  7.  Possible small right lower lobe PE: Chest CT 4/14 w/ small RLL PE.  Bilateral Venous Dopplers  negative for DVT.  - Off heparin currently with open chest and recent tamponade, restart low dose heparin when stable per TCTS.  - Anticipate switching eliquis prior to d/c.  8. T2DM: Hgb A1c 7.7 -Remains on insulin 9. Pericardial tamponade: 4/23  return to OR for clot removal, chest wall left open. Patient had been on heparin gtt for DCCV of atrial flutter.  - Timing of chest closure per surgery.   Length of Stay: [address], PA-C  08/25/2019, 9:05 AM  Advanced Heart Failure Team Pager [phone] (M-F; [place])  Please contact [facility] for night-coverage after hours (4p -7a ) and weekends on [email or web address]  Patient seen with PA, agree with the above note.   Now on CVVH with AKI and intractable volume overload, I/Os net 3386 negative, still with good UOP and pulling 100-150 cc/hr net negative UF.  Weight down.  CVP 10-11 currently, Luiz Blare out.   General: Sedated on vent Neck: JVP 10 cm, no thyromegaly or thyroid nodule.  Lungs: Clear to auscultation bilaterally with normal respiratory effort. CV: Nondisplaced PMI.  Heart regular S1/S2, no S3/S4, no murmur.  1+ edema to knees.  Abdomen: Soft, nontender, no hepatosplenomegaly, no distention.  Skin: Intact without lesions or rashes.  Neurologic: Sedated.  Extremities: No clubbing or cyanosis.  HEENT: Normal.   He is off pressors/inotropes with good cardiac output and blood pressure.  Still edematous with CVP 10, well above his baseline weight. Now on CVVH for AKI and intractable volume overload, pulling 100-150 cc/hr net UF. Also still with reasonable UOP.   - Continue CVVH, net UF 100-150 cc/hr.  Would keep at this rate for now, MAP stable.   Starting heparin gtt for PE and atrial fibrillation at low dose. On ASA and Plavix also, will need to stop one of the antiplatelet agents ultimately as he will be on anticoagulation at home.   Needs chest closure, timing per surgery. Continue antibiotics while chest is open.   CRITICAL CARE Performed by: Loralie Champagne  Total critical care time: 35 minutes  Critical care time was exclusive of separately billable procedures and treating other patients.  Critical care was necessary to treat or prevent imminent or  life-threatening deterioration.  Critical care was time spent personally by me on the following activities: development of treatment plan with patient and/or surrogate as well as nursing, discussions with consultants, evaluation of patient's response to treatment, examination of patient, obtaining history from patient or surrogate, ordering and performing treatments and interventions, ordering and review of laboratory studies, ordering and review of radiographic studies, pulse oximetry and re-evaluation of patient's condition.   Loralie Champagne 08/25/2019 1:45 PM

## 2019-08-25 NOTE — Progress Notes (Signed)
ANTICOAGULATION CONSULT NOTE - Follow Up Consult  Pharmacy Consult for Heparin Indication:  PE  No Known Allergies  Patient Measurements: Height: 5\' 9"  (175.3 cm) Weight: 109.9 kg (242 lb 4.6 oz) IBW/kg (Calculated) : 70.7 Heparin Dosing Weight: 90.6 kg  Vital Signs: Temp: 97.9 F (36.6 C) (04/30 1100) Temp Source: Axillary (04/30 1100) BP: 144/85 (04/30 1501) Pulse Rate: 103 (04/30 1501)  Labs: Recent Labs    08/23/19 0552 08/23/19 0552 08/24/19 0405 08/25/19 0326  HGB 8.5*   < > 8.6* 9.3*  HCT 27.1*  --  27.2* 29.2*  PLT 151  --  171 215  CREATININE 4.64*  --  5.27* 3.76*   < > = values in this interval not displayed.    Estimated Creatinine Clearance: 21.7 mL/min (A) (by C-G formula based on SCr of 3.76 mg/dL (H)).  Assessment: 74 year-old male on heparin for NSTEMI, s/p CABG on 4/20. CT chest 4/14 confirmed PE. Dopplers negative for DVT. No anticoagulation prior to admission.   Low-dose heparin drip resumed  4/22 for recent small PE.  4/23 pm patient increased WOB and brady arrest. Chest opened and removed clots from pericardium.  Pt returned to OR 4/29 -wound vac placed. H/h stable 9/29, pltc stable 200s slight drainage from CT Resume low dose heparin drip today   Goal of Therapy:  Heparin level no more than 0.2 Monitor platelets by anticoagulation protocol: Yes   Plan:  Heparin drip 500 uts/hr - no titration without discussion with MD  - Daily heparin level and CBC - Will continue to monitor for any signs/symptoms of bleeding  - Planning to start Eliquis once patient stable   Bonnita Nasuti Pharm.D. CPP, BCPS Clinical Pharmacist [phone] 08/25/2019 3:53 PM     Please check [email or web address] for unit-specific pharmacist phone numbers

## 2019-08-25 NOTE — Progress Notes (Signed)
1 Day Post-Op Procedure(s) (LRB): CHEST WASHOUT POST OPERATIVE OPEN HEART (N/A) APPLICATION OF WOUND VAC (N/A) Subjective: Intubated/sedated  Objective: Vital signs in last 24 hours: Temp:  [95.4 F (35.2 C)-97.9 F (36.6 C)] 95.7 F (35.4 C) (04/30 0600) Pulse Rate:  [37-147] 147 (04/30 0600) Cardiac Rhythm: Normal sinus rhythm (04/30 0400) Resp:  [17-25] 21 (04/30 0600) BP: (103-168)/(53-87) 145/59 (04/30 0400) SpO2:  [99 %-100 %] 100 % (04/30 0600) Arterial Line BP: (114-193)/(43-85) 148/74 (04/30 0600) FiO2 (%):  [40 %] 40 % (04/30 0400) Weight:  [109.9 kg] 109.9 kg (04/30 0350)  Hemodynamic parameters for last 24 hours: PAP: (26-36)/(12-19) 26/15 CVP:  [7 mmHg-17 mmHg] 11 mmHg CO:  [4.7 L/min-6.5 L/min] 6.5 L/min CI:  [2.2 L/min/m2-3 L/min/m2] 3 L/min/m2  Intake/Output from previous day: 04/29 0701 - 04/30 0700 In: 2083.7 [I.V.:1255.4; NG/GT:478.5; IV Piggyback:349.8] Out: 7948 [Urine:2950; Drains:50; Blood:10; Chest Tube:230] Intake/Output this shift: No intake/output data recorded.  General appearance: no distress Neurologic: unable to fully assess Heart: irregularly irregular rhythm Lungs: clear to auscultation bilaterally Abdomen: distended Extremities: edema 3+ Wound: dressing intact  Lab Results: Recent Labs    08/24/19 0405 08/25/19 0326  WBC 12.2* 13.5*  HGB 8.6* 9.3*  HCT 27.2* 29.2*  PLT 171 215   BMET:  Recent Labs    08/24/19 0405 08/25/19 0326  NA 140 138  K 4.2 4.5  CL 102 102  CO2 22 23  GLUCOSE 204* 171*  BUN 114* 93*  CREATININE 5.27* 3.76*  CALCIUM 8.2* 8.4*    PT/INR: No results for input(s): LABPROT, INR in the last 72 hours. ABG    Component Value Date/Time   PHART 7.513 (H) 08/20/2019 0404   HCO3 27.9 08/20/2019 0404   TCO2 29 08/20/2019 0404   ACIDBASEDEF 5.0 (H) 08/18/2019 2341   O2SAT 56.4 08/25/2019 0338   CBG (last 3)  Recent Labs    08/24/19 1958 08/24/19 2331 08/25/19 0359  GLUCAP 120* 174* 147*     Assessment/Plan: S/P Procedure(s) (LRB): CHEST WASHOUT POST OPERATIVE OPEN HEART (N/A) APPLICATION OF WOUND VAC (N/A) Mobilize Diuresis continue enteral feeding  Discontinue pa cath Bowel care    LOS: 16 days    [name] 08/25/2019

## 2019-08-25 NOTE — Progress Notes (Signed)
Nutrition Follow-up  DOCUMENTATION CODES:   Not applicable  INTERVENTION:   Tube Feeding:  Vital AF 1.2 at 55 ml/hr Pro-Stat 30 mL QID Provides 1984 kcals, 159 g of protein and 1069 mL of free water Meets 100% estimated needs  Add B-complex with C while on CRRT  NUTRITION DIAGNOSIS:   Increased nutrient needs related to post-op healing as evidenced by estimated needs.  Being addressed via TF   GOAL:   Patient will meet greater than or equal to 90% of their needs  Progressing  MONITOR:   Vent status, Skin, TF tolerance, Weight trends, I & O's, Labs  REASON FOR ASSESSMENT:   Ventilator    ASSESSMENT:   Patient with PMH significant for CAD s/p stenting, HTN, HLD, DM, and BPH. Presents this admission with RLL PE and for CABG.   4/14 Admitted  4/20 CABG x 4, Chest left open 4/22 Chest closure, application of incisional wound vac 4/23 Extubated, Cardiac arrest, chest re-opened for washout, clot removed, Re-Intubated 4/26 Return to OR for washout, Cortrak placed, TF initiated 4/29 Chest washout, application of wound VAC, CRRT initiated  CRRT continues, net UF 2.2 L plus pt with 2.9 L UOP  Patient is currently intubated on ventilator support, currently on fentanyl, versed and propofol gtt MV: 13.5 L/min Temp (24hrs), Avg:96.4 F (35.8 C), Min:95.4 F (35.2 C), Max:97.9 F (36.6 C)  Propofol: 2.9 ml/hr  Tolerating Vital 1.2 AF at 45 ml/hr with Pro-Stat 30 mL QID via Cortrak  Pt with wound vac to sternum; 100 mL in 24 hours out Chest tubes x 4 with only 60 mL  EDW around 95.5 kg; current weight 109.9 kg. Per I/O flow sheet net +1.6 L. Pt with moderate edema in all extremities.   Phosphorus should improve with CRRT  Labs: phosphorus 6.2 (H), Creatinine 3.76, BUN 93, potassium wdl, corrected calcium 10.2, albumin 1.8 Meds: ss novolog   Diet Order:   Diet Order            Diet NPO time specified  Diet effective now              EDUCATION NEEDS:    Not appropriate for education at this time  Skin:  Skin Assessment: Skin Integrity Issues: Skin Integrity Issues:: Other (Comment), Incisions, Wound VAC Wound Vac: sternum Incisions: L arm Other: n/a  Last BM:  4/26  Height:   Ht Readings from Last 1 Encounters:  08/15/19 5\' 9"  (1.753 m)    Weight:   Wt Readings from Last 1 Encounters:  08/25/19 109.9 kg   BMI:  Body mass index is 35.78 kg/m.  Estimated Nutritional Needs:   Kcal:  1867 kcal  Protein:  145-165 grams  Fluid:  >/= 1.8 L/day   Kerman Passey MS, RDN, LDN, CNSC RD Pager Number and RD On-Call Pager Number Located in [place]

## 2019-08-26 ENCOUNTER — Inpatient Hospital Stay (HOSPITAL_COMMUNITY): Payer: Medicare Other

## 2019-08-26 ENCOUNTER — Encounter: Payer: Self-pay | Admitting: Anesthesiology

## 2019-08-26 DIAGNOSIS — I214 Non-ST elevation (NSTEMI) myocardial infarction: Secondary | ICD-10-CM | POA: Diagnosis not present

## 2019-08-26 DIAGNOSIS — N179 Acute kidney failure, unspecified: Secondary | ICD-10-CM | POA: Diagnosis not present

## 2019-08-26 LAB — RENAL FUNCTION PANEL
Albumin: 1.8 g/dL — ABNORMAL LOW (ref 3.5–5.0)
Albumin: 1.9 g/dL — ABNORMAL LOW (ref 3.5–5.0)
Anion gap: 11 (ref 5–15)
Anion gap: 11 (ref 5–15)
BUN: 64 mg/dL — ABNORMAL HIGH (ref 8–23)
BUN: 57 mg/dL — ABNORMAL HIGH (ref 8–23)
CO2: 23 mmol/L (ref 22–32)
CO2: 23 mmol/L (ref 22–32)
Calcium: 8.3 mg/dL — ABNORMAL LOW (ref 8.9–10.3)
Calcium: 8.7 mg/dL — ABNORMAL LOW (ref 8.9–10.3)
Chloride: 103 mmol/L (ref 98–111)
Chloride: 104 mmol/L (ref 98–111)
Creatinine, Ser: 2.52 mg/dL — ABNORMAL HIGH (ref 0.61–1.24)
Creatinine, Ser: 2.31 mg/dL — ABNORMAL HIGH (ref 0.61–1.24)
GFR calc Af Amer: 28 mL/min — ABNORMAL LOW (ref >60)
GFR calc Af Amer: 32 mL/min — ABNORMAL LOW (ref >60)
GFR calc non Af Amer: 24 mL/min — ABNORMAL LOW (ref >60)
GFR calc non Af Amer: 27 mL/min — ABNORMAL LOW (ref >60)
Glucose, Bld: 196 mg/dL — ABNORMAL HIGH (ref 70–99)
Glucose, Bld: 218 mg/dL — ABNORMAL HIGH (ref 70–99)
Phosphorus: 4.2 mg/dL (ref 2.5–4.6)
Phosphorus: 4.2 mg/dL (ref 2.5–4.6)
Potassium: 4.5 mmol/L (ref 3.5–5.1)
Potassium: 5.2 mmol/L — ABNORMAL HIGH (ref 3.5–5.1)
Sodium: 137 mmol/L (ref 135–145)
Sodium: 138 mmol/L (ref 135–145)

## 2019-08-26 LAB — CBC
HCT: 28.3 % — ABNORMAL LOW (ref 39.0–52.0)
Hemoglobin: 9 g/dL — ABNORMAL LOW (ref 13.0–17.0)
MCH: 30.4 pg (ref 26.0–34.0)
MCHC: 31.8 g/dL (ref 30.0–36.0)
MCV: 95.6 fL (ref 80.0–100.0)
Platelets: 211 10*3/uL (ref 150–400)
RBC: 2.96 MIL/uL — ABNORMAL LOW (ref 4.22–5.81)
RDW: 17.2 % — ABNORMAL HIGH (ref 11.5–15.5)
WBC: 13.1 10*3/uL — ABNORMAL HIGH (ref 4.0–10.5)
nRBC: 0 % (ref 0.0–0.2)

## 2019-08-26 LAB — GLUCOSE, CAPILLARY
Glucose-Capillary: 169 mg/dL — ABNORMAL HIGH (ref 70–99)
Glucose-Capillary: 157 mg/dL — ABNORMAL HIGH (ref 70–99)
Glucose-Capillary: 175 mg/dL — ABNORMAL HIGH (ref 70–99)
Glucose-Capillary: 199 mg/dL — ABNORMAL HIGH (ref 70–99)
Glucose-Capillary: 190 mg/dL — ABNORMAL HIGH (ref 70–99)

## 2019-08-26 LAB — COOXEMETRY PANEL
Carboxyhemoglobin: 0.8 % (ref 0.5–1.5)
Methemoglobin: 0.7 % (ref 0.0–1.5)
O2 Saturation: 73.5 %
Total hemoglobin: 9.2 g/dL — ABNORMAL LOW (ref 12.0–16.0)

## 2019-08-26 LAB — TRIGLYCERIDES: Triglycerides: 131 mg/dL (ref <150)

## 2019-08-26 LAB — HEPARIN LEVEL (UNFRACTIONATED)
Heparin Unfractionated: <0.1 IU/mL — ABNORMAL LOW (ref 0.30–0.70)
Heparin Unfractionated: 0.11 IU/mL — ABNORMAL LOW (ref 0.30–0.70)

## 2019-08-26 LAB — MAGNESIUM: Magnesium: 2.5 mg/dL — ABNORMAL HIGH (ref 1.7–2.4)

## 2019-08-26 MED ORDER — HYDRALAZINE HCL 50 MG PO TABS
50.0000 mg | ORAL_TABLET | Freq: Three times a day (TID) | ORAL | Status: DC
  Administered 2019-08-26 – 2019-08-27 (×3): 50 mg
  Filled 2019-08-26 (×3): qty 1

## 2019-08-26 MED ORDER — AMIODARONE HCL IN DEXTROSE 360-4.14 MG/200ML-% IV SOLN
30.0000 mg/h | INTRAVENOUS | Status: DC
  Administered 2019-08-26 – 2019-08-30 (×7): 30 mg/h via INTRAVENOUS
  Filled 2019-08-26 (×9): qty 200

## 2019-08-26 MED ORDER — AMIODARONE HCL IN DEXTROSE 360-4.14 MG/200ML-% IV SOLN: 30.0000 mg/h | INTRAVENOUS | Status: DC

## 2019-08-26 MED ORDER — HYDRALAZINE HCL 20 MG/ML IJ SOLN
20.0000 mg | INTRAMUSCULAR | Status: DC | PRN
  Administered 2019-08-26: 20 mg via INTRAVENOUS
  Administered 2019-08-29: 10 mg via INTRAVENOUS
  Administered 2019-09-11 – 2019-10-08 (×3): 20 mg via INTRAVENOUS
  Filled 2019-08-26 (×5): qty 1

## 2019-08-26 MED ORDER — LACTULOSE 10 GM/15ML PO SOLN
30.0000 g | Freq: Every day | ORAL | Status: DC
  Administered 2019-08-26 – 2019-08-28 (×3): 30 g via ORAL
  Filled 2019-08-26 (×3): qty 45

## 2019-08-26 MED ORDER — PRISMASOL BGK 0/2.5 32-2.5 MEQ/L IV SOLN
INTRAVENOUS | Status: DC
  Filled 2019-08-26 (×3): qty 5000

## 2019-08-26 NOTE — Anesthesia Postprocedure Evaluation (Signed)
Anesthesia Post Note  Patient: Christian [name]  Procedure(s) Performed: CHEST WASHOUT POST OPERATIVE OPEN HEART (N/A Chest) APPLICATION OF WOUND VAC (N/A )     Patient location during evaluation: SICU Anesthesia Type: General Level of consciousness: sedated Pain management: pain level controlled Vital Signs Assessment: post-procedure vital signs reviewed and stable Respiratory status: patient remains intubated per anesthesia plan Cardiovascular status: stable Postop Assessment: no apparent nausea or vomiting Anesthetic complications: no    Last Vitals:  Vitals:   08/26/19 1100 08/26/19 1114  BP: 130/60 (!) 152/59  Pulse: (!) 102 (!) 102  Resp: 20 19  Temp: 36.6 C 36.6 C  SpO2: 100% 100%    Last Pain:  Vitals:   08/26/19 0752  TempSrc: Core  PainSc:                  Cartel Mauss

## 2019-08-26 NOTE — Progress Notes (Signed)
[facility] Progress Note  Name: Christian [name] MRN: [id number] DOB: 24-Jun-1945  Chief Complaint:  NSTEMI and s/p CABG  Subjective:  Seen and examined on CRRT.  Procedure supervised.  had 4.5 liters UF over 4/30 with CRRT.  Had 660 mL UOP over 4/30.    Review of systems:  Unable to obtain 2/2 intubated and sedated    Intake/Output Summary (Last 24 hours) at 08/26/2019 0548 Last data filed at 08/26/2019 0500 Gross per 24 hour  Intake 2862.82 ml  Output 5772 ml  Net -2909.18 ml    Vitals:  Vitals:   08/26/19 0400 08/26/19 0412 08/26/19 0420 08/26/19 0500  BP: 124/75   134/73  Pulse: 87   (!) 35  Resp: 18   19  Temp:  97.6 F (36.4 C)    TempSrc:  Axillary    SpO2: 100%   100%  Weight:   105.2 kg   Height:         Physical Exam:  General adult male in bed critically ill intubated HEENT normocephalic atraumatic Lungs coarse mechanical breath sounds - 40 FIO2 and 8 PEEP  Heart regular rate and rhythm  Abdomen soft distended/obese habitus Extremities diffuse edema  Neuro sedation running  Access right subclavian nontunneled catheter  Medications reviewed   Labs:  BMP Latest Ref Rng & Units 08/26/2019 08/25/2019 08/25/2019  Glucose 70 - 99 mg/dL 196(H) 166(H) 171(H)  BUN 8 - 23 mg/dL 64(H) 74(H) 93(H)  Creatinine 0.61 - 1.24 mg/dL 2.52(H) 3.01(H) 3.76(H)  BUN/Creat Ratio 10 - 24 - - -  Sodium 135 - 145 mmol/L 137 137 138  Potassium 3.5 - 5.1 mmol/L 4.5 4.7 4.5  Chloride 98 - 111 mmol/L 103 100 102  CO2 22 - 32 mmol/L 23 23 23   Calcium 8.9 - 10.3 mg/dL 8.3(L) 8.5(L) 8.4(L)     Assessment/Plan:   Patient with NSTEMI and status post CABG with course complicated by cardiac arrest and tamponade and cardiogenic shock who has been volume overloaded.  He has developed acute kidney injury and nephrology was consulted for assistance with management of AKI.    # Acute kidney injury - 2/2 ATN  - On CRRT. 4k bath and UF goal net neg 100 to 150 ml/hr as  tolerated - note baseline Cr approximately 1.2  - would transition off of morphine given AKI   # Fluid volume overload - On CRRT   # CAD s/p CABG - per CT surgery  # Acute hypoxic respiratory failure on mechanical ventilation - Optimize volume status with CRRT   # Hypertension - Controlled   # Anemia normocytic  - status post packed red blood cells this admission; no acute indication for PRBC's.   Claudia Desanctis, MD 08/26/2019 6:00 AM

## 2019-08-26 NOTE — Anesthesia Preprocedure Evaluation (Signed)
Anesthesia Evaluation  Patient identified by MRN, date of birth, ID band Patient unresponsive    Reviewed: Allergy & Precautions, H&P , NPO status , Patient's Chart, lab work & pertinent test results, Unable to perform ROS - Chart review only  History of Anesthesia Complications Negative for: history of anesthetic complications  Airway Mallampati: Intubated       Dental  (+) Edentulous Upper, Edentulous Lower   Pulmonary former smoker,  Vent dependent   breath sounds clear to auscultation       Cardiovascular hypertension, + CAD, + Past MI, + CABG and +CHF   Rhythm:Regular     Neuro/Psych  Neuromuscular disease negative psych ROS   GI/Hepatic Neg liver ROS, hiatal hernia,   Endo/Other  diabetes  Renal/GU Renal InsufficiencyRenal disease     Musculoskeletal   Abdominal   Peds  Hematology  (+) Blood dyscrasia, anemia ,   Anesthesia Other Findings   Reproductive/Obstetrics                             Anesthesia Physical Anesthesia Plan  ASA: IV  Anesthesia Plan: General   Post-op Pain Management:    Induction: Inhalational and Intravenous  PONV Risk Score and Plan: 2 and Treatment may vary due to age or medical condition  Airway Management Planned: Oral ETT  Additional Equipment:   Intra-op Plan:   Post-operative Plan: Post-operative intubation/ventilation  Informed Consent:     Only emergency history available and History available from chart only  Plan Discussed with: CRNA and Surgeon  Anesthesia Plan Comments:         Anesthesia Quick Evaluation

## 2019-08-26 NOTE — Progress Notes (Signed)
Patient ID: Christian [name], male   DOB: 10/25/1945, 74 y.o.   MRN: [id number]     Advanced Heart Failure Rounding Note  PCP-Cardiologist: No primary care provider on file.   Subjective:    08/09/19 Admitted with NSTEM and ? Small RLL PE. Weight 213 pounds.  08/15/19 CABG x4 on 08/15/19 08/18/19 Cardiac arrest due to tamponade. Chest opened for washout 4/26 Chest washout in OR 4/19 CVVH started  Remains intubated and sedated. FiO2 40%.    He is off pressors. Co-ox 74%.  BP high when stimulated.   Has AKI. Suspect ATN from cardiogenic shock/pericardial tamponade. CVVH initiated 4/29 to assist w/ volume removal. I/O net negative 2819 yesterday with 695 cc UOP.  CVP not set up.  Remains about 20 lbs above pre-op weight. UF ongoing 100-150 cc/hr net.   On vanc/cefepime in setting of open chest. Afebrile, WBCs 13.   He is not responsive this morning but sedated.   Echo 4/25: EF 40-45% with some residual pericardial clot.  Objective:   Weight Range: 105.2 kg Body mass index is 34.25 kg/m.   Vital Signs:   Temp:  [96.6 F (35.9 C)-98.6 F (37 C)] 97.6 F (36.4 C) (05/01 0412) Pulse Rate:  [35-107] 92 (05/01 0600) Resp:  [18-28] 22 (05/01 0600) BP: (91-160)/(56-95) 153/87 (05/01 0600) SpO2:  [99 %-100 %] 100 % (05/01 0600) Arterial Line BP: (97-186)/(48-102) 168/67 (05/01 0600) FiO2 (%):  [40 %-50 %] 40 % (05/01 0400) Weight:  [105.2 kg] 105.2 kg (05/01 0420) Last BM Date: 08/13/19  Weight change: Filed Weights   08/24/19 0600 08/25/19 0350 08/26/19 0420  Weight: 110.8 kg 109.9 kg 105.2 kg    Intake/Output:   Intake/Output Summary (Last 24 hours) at 08/26/2019 0637 Last data filed at 08/26/2019 0600 Gross per 24 hour  Intake 2868.61 ml  Output 5703 ml  Net -2834.39 ml      Physical Exam   General: On vent Neck: Unable to assess JVP with lines, no thyromegaly or thyroid nodule.  Lungs: Decreased at bases.  CV: Chest remains open.  Heart regular S1/S2, no S3/S4, no  murmur.  1+ edema to knees.  Abdomen: Soft, no hepatosplenomegaly, no distention.  Skin: Intact without lesions or rashes.  Neurologic: Sedated, not responding Extremities: No clubbing or cyanosis.  HEENT: Normal.    Telemetry   NSR 90s with PACs (personally reviewed)  EKG    N/a   Labs    CBC Recent Labs    08/25/19 0326 08/26/19 0335  WBC 13.5* 13.1*  HGB 9.3* 9.0*  HCT 29.2* 28.3*  MCV 94.5 95.6  PLT 215 852   Basic Metabolic Panel Recent Labs    08/25/19 0326 08/25/19 0326 08/25/19 1531 08/26/19 0339  NA 138   < > 137 137  K 4.5   < > 4.7 4.5  CL 102   < > 100 103  CO2 23   < > 23 23  GLUCOSE 171*   < > 166* 196*  BUN 93*   < > 74* 64*  CREATININE 3.76*   < > 3.01* 2.52*  CALCIUM 8.4*   < > 8.5* 8.3*  MG 2.4  --   --  2.5*  PHOS 6.2*   < > 4.9* 4.2   < > = values in this interval not displayed.   Liver Function Tests Recent Labs    08/24/19 0405 08/25/19 0326 08/25/19 1531 08/26/19 0339  AST 59*  --   --   --  ALT 169*  --   --   --   ALKPHOS 115  --   --   --   BILITOT 0.7  --   --   --   PROT 4.3*  --   --   --   ALBUMIN 1.6*   < > 2.0* 1.8*   < > = values in this interval not displayed.   No results for input(s): LIPASE, AMYLASE in the last 72 hours. Cardiac Enzymes No results for input(s): CKTOTAL, CKMB, CKMBINDEX, TROPONINI in the last 72 hours.  BNP: BNP (last 3 results) Recent Labs    08/09/19 1240  BNP 89.1    ProBNP (last 3 results) Recent Labs    11/23/18 1120 08/09/19 0922  PROBNP 18.0 78.0     D-Dimer No results for input(s): DDIMER in the last 72 hours. Hemoglobin A1C No results for input(s): HGBA1C in the last 72 hours. Fasting Lipid Panel Recent Labs    08/24/19 2003  TRIG 117   Thyroid Function Tests No results for input(s): TSH, T4TOTAL, T3FREE, THYROIDAB in the last 72 hours.  Invalid input(s): FREET3  Other results:   Imaging    DG CHEST PORT 1 VIEW  Result Date: 08/25/2019 CLINICAL  DATA:  Status post CABG. EXAM: PORTABLE CHEST 1 VIEW COMPARISON:  08/24/2019 and prior radiographs FINDINGS: Cardiomegaly and CABG changes are again noted. An endotracheal tube with tip 3 cm above the carina, RIGHT subclavian central venous catheter with tip overlying the LOWER SVC, small bore feeding tube entering the stomach with tip off field of view and mediastinal and thoracostomy tubes again noted. A RIGHT IJ since Swan-Ganz catheter has been removed with sheath remaining. There is no evidence of pneumothorax. Bibasilar atelectasis again noted, LEFT-greater-than-RIGHT. IMPRESSION: Unchanged appearance of the chest except for removal of RIGHT IJ Swan-Ganz catheter. Electronically Signed   By: Margarette Canada M.D.   On: 08/25/2019 09:12     Medications:     Scheduled Medications: . sodium chloride   Intravenous Once  . sodium chloride   Intravenous Once  . amiodarone  200 mg Oral BID  . aspirin  81 mg Per Tube Daily  . atorvastatin  40 mg Per Tube Daily  . B-complex with vitamin C  1 tablet Per Tube Daily  . bisacodyl  10 mg Oral Daily   Or  . bisacodyl  10 mg Rectal Daily  . chlorhexidine gluconate (MEDLINE KIT)  15 mL Mouth Rinse BID  . Chlorhexidine Gluconate Cloth  6 each Topical Daily  . clopidogrel  75 mg Per Tube Daily  . docusate  100 mg Per Tube BID  . feeding supplement (PRO-STAT SUGAR FREE 64)  30 mL Per Tube QID  . fentaNYL (SUBLIMAZE) injection  25 mcg Intravenous Once  . hydrALAZINE  25 mg Per Tube Q8H  . insulin aspart  0-24 Units Subcutaneous Q4H  . isosorbide dinitrate  20 mg Per Tube TID  . mouth rinse  15 mL Mouth Rinse 10 times per day  . pantoprazole sodium  40 mg Per Tube Daily  . sodium chloride flush  10-40 mL Intracatheter Q12H  . sodium chloride flush  3 mL Intravenous Q12H  . tamsulosin  0.4 mg Oral QPC breakfast  . trospium  20 mg Per Tube BID    Infusions: .  prismasol BGK 4/2.5 500 mL/hr at 08/26/19 0226  .  prismasol BGK 4/2.5 300 mL/hr at 08/26/19  0419  . sodium chloride Stopped (08/25/19 0743)  .  sodium chloride 10 mL/hr at 08/26/19 0600  . sodium chloride Stopped (08/22/19 1830)  . ceFEPime (MAXIPIME) IV Stopped (08/25/19 2333)  . epinephrine Stopped (08/21/19 1531)  . feeding supplement (VITAL AF 1.2 CAL) 1,000 mL (08/25/19 1929)  . fentaNYL infusion INTRAVENOUS 100 mcg/hr (08/26/19 0600)  . heparin 500 Units/hr (08/26/19 0600)  . lactated ringers    . lactated ringers    . lactated ringers    . lactated ringers 20 mL/hr at 08/20/19 1200  . magnesium sulfate bolus IVPB    . midazolam 2 mg/hr (08/26/19 0600)  . prismasol BGK 4/2.5 1,500 mL/hr at 08/26/19 0509  . propofol (DIPRIVAN) infusion 5 mcg/kg/min (08/26/19 4827)  . vancomycin Stopped (08/26/19 0154)    PRN Medications: sodium chloride, cromolyn, dextrose, fentaNYL, heparin, heparin, hydrALAZINE, lactated ringers, midazolam, morphine injection, ondansetron (ZOFRAN) IV, oxyCODONE, sodium chloride flush, sodium chloride flush, traMADol     Assessment/Plan   1. CAD: s/p PDA angioplasty + stent in 02/2014.  Admitted for NSTEMI 4/14. Hs Trop peaked at 3,309.  LHC w/ severe 3V CAD. Echo w/ reduced EF 30-35%. RV ok.  S/p CABG x 4 on 4/20 (LIMA-LAD, RIMA to PDA, Lt radial artery graft to 1st OM and distal OM branches of LCx).  Chest reopened urgently at the bedside for cardiac tamponade and removal of clot on 4/23  - continue ASA/Plavix.  Will need to stop one of the antiplatelet agents ultimately as he will be on anticoagulation at home.  - Radial harvest. On isordil 20 tid.  - Atorvastatin 40 daily.   - Chest closure timing per Dr. Orvan Seen, need to get volume off.  2. Acute Systolic Heart Failure: Ischemic Cardiomyopathy -> cardiogenic shock.  LVEF severely reduced at 30-35%, in the setting of NSTEM/ Multivessel CAD, RV ok initially.  Underwent urgent sternotomy at the bedside for cardiac tamponade and removal of clot on 4/23. Chest remains open. Luiz Blare numbers have been  concerning for RV failure (swan now out).  Echo 4/24 EF 40 to 45% with inotropic support, small amount of residual pericardial clot, RV moderately hypokinetic.  Off pressors/inotropes, co-ox 73% today. BP elevated, especially when stimulated. CVP not set up but weight remains about 20 lbs above pre-op.  - Continue CVVH, net UF 100-150 cc/hr again today.  - Remains on isordil, increase hydralazine to 50 mg tid for afterload reduction with elevated BP.   3.  Postoperative acute hypoxic respiratory failure: Reintubated on 4/23 due to cardiogenic shock and return to OR. - Keep intubated/sedated while chest open.  4. AKI: Baseline SCr 0.9. Creatinine peaked to 5.3, suspect ATN in setting of cardiogenic shock/tamponade.  Now on CVVH for volume removal, still making urine.  - Continue to follow, appreciate nephrology's assistance. 5.  Shock liver: Due to cardiac arrest on 4/23.  LFTs trending down.  6. 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 Maintaining NSR.   - continue PO amio 200 mg twice a day.  - Low dose heparin gtt currently while chest open.   7.  Possible small right lower lobe PE: Chest CT 4/14 w/ small RLL PE.  Bilateral Venous Dopplers negative for DVT.  - Low dose heparin has been started.   - Anticipate switching eliquis prior to d/c.  8. T2DM: Hgb A1c 7.7 -Remains on insulin 9. Pericardial tamponade: 4/23 return to OR for clot removal, chest wall left open. Patient had been on heparin gtt for DCCV of atrial flutter.  - Timing of chest closure per surgery.  Length of Stay: [place] Performed by: Loralie Champagne  Total critical care time: 35 minutes  Critical care time was exclusive of separately billable procedures and treating other patients.  Critical care was necessary to treat or prevent imminent or life-threatening deterioration.  Critical care was time spent personally by me on the following activities: development of treatment plan with patient and/or  surrogate as well as nursing, discussions with consultants, evaluation of patient's response to treatment, examination of patient, obtaining history from patient or surrogate, ordering and performing treatments and interventions, ordering and review of laboratory studies, ordering and review of radiographic studies, pulse oximetry and re-evaluation of patient's condition.   Loralie Champagne 08/26/2019 6:37 AM

## 2019-08-26 NOTE — Progress Notes (Signed)
ANTICOAGULATION CONSULT NOTE - Follow Up Consult  Pharmacy Consult for Heparin Indication:  PE  No Known Allergies  Patient Measurements: Height: 5\' 9"  (175.3 cm) Weight: 105.2 kg (231 lb 14.8 oz) IBW/kg (Calculated) : 70.7 Heparin Dosing Weight: 90.6 kg  Vital Signs: Temp: 97.9 F (36.6 C) (05/01 1114) Temp Source: Bladder (05/01 1200) BP: 152/59 (05/01 1114) Pulse Rate: 102 (05/01 1114)  Labs: Recent Labs    08/24/19 0405 08/24/19 0405 08/25/19 0326 08/25/19 1531 08/26/19 0335 08/26/19 0339 08/26/19 1028  HGB 8.6*   < > 9.3*  --  9.0*  --   --   HCT 27.2*  --  29.2*  --  28.3*  --   --   PLT 171  --  215  --  211  --   --   HEPARINUNFRC  --   --   --   --   --   --  <0.10*  CREATININE 5.27*   < > 3.76* 3.01*  --  2.52*  --    < > = values in this interval not displayed.    Estimated Creatinine Clearance: 31.7 mL/min (A) (by C-G formula based on SCr of 2.52 mg/dL (H)).  Assessment: 74 year-old male on heparin for NSTEMI, s/p CABG on 4/20. CT chest 4/14 confirmed PE. Dopplers negative for DVT. No anticoagulation prior to admission.   Low-dose heparin drip resumed  4/22 for recent small PE.  4/23 pm patient increased WOB and brady arrest. Chest opened and removed clots from pericardium.  Pt returned to OR 4/29 -wound vac placed. H/h stable 9/29, pltc stable 200s slight drainage from CT Resume low dose heparin drip 4/30  5/1 heparin level undetectable as expected.  Discussed with Dr. Orvan Seen, will titrate up to a goal level of 0.2-0.3   Goal of Therapy:  Heparin level 0.2-0.3 Monitor platelets by anticoagulation protocol: Yes   Plan:  -Increase IV heparin to 650 units/hr.  Check heparin level in 8 hrs. - Daily heparin level and CBC - Will continue to monitor for any signs/symptoms of bleeding  - Planning to start Eliquis once patient stable   Marguerite Olea, [facility] Clinical Pharmacist Phone [phone]  08/26/2019 1:01 PM   Please check  [email or web address] for unit-specific pharmacist phone numbers

## 2019-08-26 NOTE — Progress Notes (Signed)
2 Days Post-Op Procedure(s) (LRB): CHEST WASHOUT POST OPERATIVE OPEN HEART (N/A) APPLICATION OF WOUND VAC (N/A) Subjective: Intubated/sedated  Objective: Vital signs in last 24 hours: Temp:  [97 F (36.1 C)-98.6 F (37 C)] 97.9 F (36.6 C) (05/01 1100) Pulse Rate:  [35-124] 102 (05/01 1100) Cardiac Rhythm: Normal sinus rhythm;Sinus tachycardia (05/01 0800) Resp:  [17-25] 20 (05/01 1100) BP: (91-171)/(56-95) 130/60 (05/01 1100) SpO2:  [99 %-100 %] 100 % (05/01 1100) Arterial Line BP: (97-186)/(48-102) 136/54 (05/01 1100) FiO2 (%):  [40 %-65 %] 65 % (05/01 0736) Weight:  [105.2 kg] 105.2 kg (05/01 0420)  Hemodynamic parameters for last 24 hours: CVP:  [5 mmHg-9 mmHg] 5 mmHg  Intake/Output from previous day: 04/30 0701 - 05/01 0700 In: 2879.3 [I.V.:1018.6; NG/GT:1410; IV Piggyback:450.7] Out: 5946 [Urine:695; Drains:225; Chest Tube:130] Intake/Output this shift: Total I/O In: 446.1 [I.V.:126.1; NG/GT:220; IV Piggyback:100] Out: 505 [Urine:75; Other:430]  General appearance: sedated Neurologic: opens eyes to voice Heart: regular rate and rhythm, S1, S2 normal, no murmur, click, rub or gallop Lungs: clear to auscultation bilaterally Abdomen: distended Extremities: extremities normal, atraumatic, no cyanosis or edema Wound: dressing intact  Lab Results: Recent Labs    08/25/19 0326 08/26/19 0335  WBC 13.5* 13.1*  HGB 9.3* 9.0*  HCT 29.2* 28.3*  PLT 215 211   BMET:  Recent Labs    08/25/19 1531 08/26/19 0339  NA 137 137  K 4.7 4.5  CL 100 103  CO2 23 23  GLUCOSE 166* 196*  BUN 74* 64*  CREATININE 3.01* 2.52*  CALCIUM 8.5* 8.3*    PT/INR: No results for input(s): LABPROT, INR in the last 72 hours. ABG    Component Value Date/Time   PHART 7.513 (H) 08/20/2019 0404   HCO3 27.9 08/20/2019 0404   TCO2 29 08/20/2019 0404   ACIDBASEDEF 5.0 (H) 08/18/2019 2341   O2SAT 73.5 08/26/2019 0402   CBG (last 3)  Recent Labs    08/25/19 2311 08/26/19 0410  08/26/19 0755  GLUCAP 158* 169* 157*    Assessment/Plan: S/P Procedure(s) (LRB): CHEST WASHOUT POST OPERATIVE OPEN HEART (N/A) APPLICATION OF WOUND VAC (N/A) Mobilize Diuresis head CT when HD circuit needs to be changed  Consider tracheostomy soon   LOS: 17 days    [name] 08/26/2019

## 2019-08-26 NOTE — Progress Notes (Signed)
Transported patient on ventilator to and from CT without incident.  Suctioned patient before and after trip, no distress noted, will continue to monitor.

## 2019-08-26 NOTE — Progress Notes (Addendum)
ANTICOAGULATION CONSULT NOTE - Follow Up Consult  Pharmacy Consult for Heparin Indication:  PE 08/09/19  No Known Allergies  Patient Measurements: Height: 5\' 9"  (175.3 cm) Weight: 105.2 kg (231 lb 14.8 oz) IBW/kg (Calculated) : 70.7 Heparin Dosing Weight: 90.6 kg  Vital Signs: Temp: 100 F (37.8 C) (05/01 2000) Temp Source: Bladder (05/01 1200) BP: 122/67 (05/01 1900) Pulse Rate: 103 (05/01 2000)  Labs: Recent Labs    08/24/19 0405 08/24/19 0405 08/25/19 0326 08/25/19 0326 08/25/19 1531 08/26/19 0335 08/26/19 0339 08/26/19 1028 08/26/19 1546 08/26/19 1919  HGB 8.6*   < > 9.3*  --   --  9.0*  --   --   --   --   HCT 27.2*  --  29.2*  --   --  28.3*  --   --   --   --   PLT 171  --  215  --   --  211  --   --   --   --   HEPARINUNFRC  --   --   --   --   --   --   --  <0.10*  --  0.11*  CREATININE 5.27*   < > 3.76*   < > 3.01*  --  2.52*  --  2.31*  --    < > = values in this interval not displayed.    Estimated Creatinine Clearance: 34.5 mL/min (A) (by C-G formula based on SCr of 2.31 mg/dL (H)).  Assessment: 74 year-old male on heparin for NSTEMI, s/p CABG on 4/20. CT chest 4/14 confirmed PE. Dopplers negative for DVT. No anticoagulation prior to admission.   Low-dose heparin drip resumed 4/22 for recent small PE.  4/23 pm patient increased WOB and brady arrest. Chest opened and removed clots from pericardium.  Pt returned to OR 4/29 -wound vac placed. H/h stable 9/29, pltc stable 200s slight drainage from CT. Low dose heparin drip resumed 4/30  5/1 Discussed with Dr. Orvan Seen, will titrate up to a goal level of 0.2-0.3. Evening HL 0.11, H/H and plt stable. Confirmed no bruising or bleeding with RN. Will increase conservatively.    Goal of Therapy:  Heparin level 0.2-0.3 units/mL  Monitor platelets by anticoagulation protocol: Yes   Plan:  - Increase IV heparin to 700 units/hr  - Daily heparin level and CBC - Will continue to monitor for any signs/symptoms of  bleeding  - Planning to start Eliquis once patient stable   Benetta Spar, PharmD, BCPS, [facility] Clinical Pharmacist  Please check AMION for all [place] phone numbers After 10:00 PM, call [place]

## 2019-08-27 ENCOUNTER — Inpatient Hospital Stay (HOSPITAL_COMMUNITY): Payer: Medicare Other

## 2019-08-27 DIAGNOSIS — I214 Non-ST elevation (NSTEMI) myocardial infarction: Secondary | ICD-10-CM | POA: Diagnosis not present

## 2019-08-27 DIAGNOSIS — J9601 Acute respiratory failure with hypoxia: Secondary | ICD-10-CM

## 2019-08-27 DIAGNOSIS — N179 Acute kidney failure, unspecified: Secondary | ICD-10-CM | POA: Diagnosis not present

## 2019-08-27 LAB — HEPARIN LEVEL (UNFRACTIONATED)
Heparin Unfractionated: 0.14 IU/mL — ABNORMAL LOW (ref 0.30–0.70)
Heparin Unfractionated: 0.17 IU/mL — ABNORMAL LOW (ref 0.30–0.70)
Heparin Unfractionated: 0.23 IU/mL — ABNORMAL LOW (ref 0.30–0.70)

## 2019-08-27 LAB — CBC
HCT: 27.4 % — ABNORMAL LOW (ref 39.0–52.0)
Hemoglobin: 8.6 g/dL — ABNORMAL LOW (ref 13.0–17.0)
MCH: 29.9 pg (ref 26.0–34.0)
MCHC: 31.4 g/dL (ref 30.0–36.0)
MCV: 95.1 fL (ref 80.0–100.0)
Platelets: 236 10*3/uL (ref 150–400)
RBC: 2.88 MIL/uL — ABNORMAL LOW (ref 4.22–5.81)
RDW: 17.2 % — ABNORMAL HIGH (ref 11.5–15.5)
WBC: 16.9 10*3/uL — ABNORMAL HIGH (ref 4.0–10.5)
nRBC: 0 % (ref 0.0–0.2)

## 2019-08-27 LAB — RENAL FUNCTION PANEL
Albumin: 2 g/dL — ABNORMAL LOW (ref 3.5–5.0)
Albumin: 2.2 g/dL — ABNORMAL LOW (ref 3.5–5.0)
Anion gap: 10 (ref 5–15)
Anion gap: 13 (ref 5–15)
BUN: 58 mg/dL — ABNORMAL HIGH (ref 8–23)
BUN: 46 mg/dL — ABNORMAL HIGH (ref 8–23)
CO2: 23 mmol/L (ref 22–32)
CO2: 23 mmol/L (ref 22–32)
Calcium: 8.6 mg/dL — ABNORMAL LOW (ref 8.9–10.3)
Calcium: 9 mg/dL (ref 8.9–10.3)
Chloride: 102 mmol/L (ref 98–111)
Chloride: 101 mmol/L (ref 98–111)
Creatinine, Ser: 2.4 mg/dL — ABNORMAL HIGH (ref 0.61–1.24)
Creatinine, Ser: 2.31 mg/dL — ABNORMAL HIGH (ref 0.61–1.24)
GFR calc Af Amer: 30 mL/min — ABNORMAL LOW (ref >60)
GFR calc Af Amer: 32 mL/min — ABNORMAL LOW (ref >60)
GFR calc non Af Amer: 26 mL/min — ABNORMAL LOW (ref >60)
GFR calc non Af Amer: 27 mL/min — ABNORMAL LOW (ref >60)
Glucose, Bld: 206 mg/dL — ABNORMAL HIGH (ref 70–99)
Glucose, Bld: 134 mg/dL — ABNORMAL HIGH (ref 70–99)
Phosphorus: 3.9 mg/dL (ref 2.5–4.6)
Phosphorus: 4 mg/dL (ref 2.5–4.6)
Potassium: 5.1 mmol/L (ref 3.5–5.1)
Potassium: 5.2 mmol/L — ABNORMAL HIGH (ref 3.5–5.1)
Sodium: 135 mmol/L (ref 135–145)
Sodium: 137 mmol/L (ref 135–145)

## 2019-08-27 LAB — COOXEMETRY PANEL
Carboxyhemoglobin: 0.6 % (ref 0.5–1.5)
Methemoglobin: 0.7 % (ref 0.0–1.5)
O2 Saturation: 65.9 %
Total hemoglobin: 13.1 g/dL (ref 12.0–16.0)

## 2019-08-27 LAB — GLUCOSE, CAPILLARY
Glucose-Capillary: 104 mg/dL — ABNORMAL HIGH (ref 70–99)
Glucose-Capillary: 119 mg/dL — ABNORMAL HIGH (ref 70–99)
Glucose-Capillary: 124 mg/dL — ABNORMAL HIGH (ref 70–99)
Glucose-Capillary: 126 mg/dL — ABNORMAL HIGH (ref 70–99)
Glucose-Capillary: 177 mg/dL — ABNORMAL HIGH (ref 70–99)
Glucose-Capillary: 184 mg/dL — ABNORMAL HIGH (ref 70–99)
Glucose-Capillary: 194 mg/dL — ABNORMAL HIGH (ref 70–99)

## 2019-08-27 LAB — TRIGLYCERIDES: Triglycerides: 71 mg/dL (ref <150)

## 2019-08-27 LAB — MAGNESIUM: Magnesium: 2.6 mg/dL — ABNORMAL HIGH (ref 1.7–2.4)

## 2019-08-27 MED ORDER — METOCLOPRAMIDE HCL 5 MG/ML IJ SOLN
5.0000 mg | Freq: Three times a day (TID) | INTRAMUSCULAR | Status: DC
  Administered 2019-08-27 – 2019-08-28 (×4): 5 mg via INTRAVENOUS
  Administered 2019-08-28: 10 mg via INTRAVENOUS
  Administered 2019-08-28 – 2019-09-02 (×13): 5 mg via INTRAVENOUS
  Filled 2019-08-27 (×17): qty 2

## 2019-08-27 MED ORDER — HYDRALAZINE HCL 25 MG PO TABS
25.0000 mg | ORAL_TABLET | Freq: Three times a day (TID) | ORAL | Status: DC
  Administered 2019-08-27: 25 mg
  Filled 2019-08-27: qty 1

## 2019-08-27 MED ORDER — PRISMASOL BGK 0/2.5 32-2.5 MEQ/L REPLACEMENT SOLN
Status: DC
  Administered 2019-08-27 – 2019-08-30 (×2): 1 via INTRAVENOUS_CENTRAL
  Filled 2019-08-27 (×10): qty 5000

## 2019-08-27 NOTE — Progress Notes (Addendum)
Patient ID: Christian [name], male   DOB: 1945/11/22, 74 y.o.   MRN: [id number]     Advanced Heart Failure Rounding Note  PCP-Cardiologist: No primary care provider on file.   Subjective:    08/09/19 Admitted with NSTEM and ? Small RLL PE. Weight 213 pounds.  08/15/19 CABG x4 on 08/15/19 08/18/19 Cardiac arrest due to tamponade. Chest opened for washout 4/26 Chest washout in OR 4/19 CVVH started  Remains intubated and sedated. FiO2 40%.  This morning had emesis with fecal-appearing material, abdomen more distended.  Concern for aspiration.   He will wake up and can follow commands.  CT head negative on 5/1.   He is off pressors. Co-ox 66%.  BP not as high.    Has AKI. Suspect ATN from cardiogenic shock/pericardial tamponade. CVVH initiated 4/29 to assist w/ volume removal. I/O net 2607 negative with weight down 7 lbs.  CVP 10.  Remains about 10 lbs above pre-op weight. UF ongoing 100-150 cc/hr net.   On vanc/cefepime in setting of open chest. Tm 100, WBCs 17.    Echo 4/25: EF 40-45% with some residual pericardial clot.  Objective:   Weight Range: 101.5 kg Body mass index is 33.04 kg/m.   Vital Signs:   Temp:  [97.7 F (36.5 C)-100 F (37.8 C)] 98.6 F (37 C) (05/02 0735) Pulse Rate:  [54-124] 102 (05/02 0735) Resp:  [17-41] 28 (05/02 0735) BP: (85-177)/(50-84) 127/53 (05/02 0735) SpO2:  [98 %-100 %] 99 % (05/02 0735) Arterial Line BP: (91-166)/(44-112) 119/51 (05/02 0715) FiO2 (%):  [40 %] 40 % (05/02 0735) Weight:  [101.5 kg] 101.5 kg (05/02 0615) Last BM Date: 08/26/19(had a small BM in AM of 08/26/19)  Weight change: Filed Weights   08/25/19 0350 08/26/19 0420 08/27/19 0615  Weight: 109.9 kg 105.2 kg 101.5 kg    Intake/Output:   Intake/Output Summary (Last 24 hours) at 08/27/2019 0803 Last data filed at 08/27/2019 0700 Gross per 24 hour  Intake 2971.11 ml  Output 5494 ml  Net -2522.89 ml      Physical Exam   General: Awake on vent Neck: JVP 10 cm, no  thyromegaly or thyroid nodule.  Lungs: Decreased at bases.  CV: Nondisplaced PMI.  Heart regular S1/S2, no S3/S4, no murmur.  No peripheral edema.   Abdomen: No hepatosplenomegaly, moderate distention.  Skin: Intact without lesions or rashes.  Neurologic: Awake on vent, can follow commands.  Extremities: No clubbing or cyanosis.  HEENT: Normal.    Telemetry   NSR 90s with PACs (personally reviewed)  EKG    N/a   Labs    CBC Recent Labs    08/26/19 0335 08/27/19 0328  WBC 13.1* 16.9*  HGB 9.0* 8.6*  HCT 28.3* 27.4*  MCV 95.6 95.1  PLT 211 371   Basic Metabolic Panel Recent Labs    08/26/19 0339 08/26/19 0339 08/26/19 1546 08/27/19 0328  NA 137   < > 138 135  K 4.5   < > 5.2* 5.1  CL 103   < > 104 102  CO2 23   < > 23 23  GLUCOSE 196*   < > 218* 206*  BUN 64*   < > 57* 58*  CREATININE 2.52*   < > 2.31* 2.40*  CALCIUM 8.3*   < > 8.7* 8.6*  MG 2.5*  --   --  2.6*  PHOS 4.2   < > 4.2 3.9   < > = values in this interval not displayed.  Liver Function Tests Recent Labs    08/26/19 1546 08/27/19 0328  ALBUMIN 1.9* 2.0*   No results for input(s): LIPASE, AMYLASE in the last 72 hours. Cardiac Enzymes No results for input(s): CKTOTAL, CKMB, CKMBINDEX, TROPONINI in the last 72 hours.  BNP: BNP (last 3 results) Recent Labs    08/09/19 1240  BNP 89.1    ProBNP (last 3 results) Recent Labs    11/23/18 1120 08/09/19 0922  PROBNP 18.0 78.0     D-Dimer No results for input(s): DDIMER in the last 72 hours. Hemoglobin A1C No results for input(s): HGBA1C in the last 72 hours. Fasting Lipid Panel Recent Labs    08/26/19 1028  TRIG 131   Thyroid Function Tests No results for input(s): TSH, T4TOTAL, T3FREE, THYROIDAB in the last 72 hours.  Invalid input(s): FREET3  Other results:   Imaging    CT HEAD WO CONTRAST  Result Date: 08/26/2019 CLINICAL DATA:  Acute neuro deficit.  Left-sided weakness EXAM: CT HEAD WITHOUT CONTRAST TECHNIQUE:  Contiguous axial images were obtained from the base of the skull through the vertex without intravenous contrast. COMPARISON:  MRI head 12/26/2018 FINDINGS: Brain: Generalized atrophy and ventricular enlargement unchanged. Negative for acute infarct, hemorrhage, mass Vascular: Normal arterial flow voids. Skull: Negative Sinuses/Orbits: Air-fluid level in the sphenoid sinus. Bilateral mastoid effusion. NG tube in place. Mild mucosal edema maxillary sinus bilaterally. Other: None IMPRESSION: Generalized atrophy without acute intracranial abnormality. Sinus mucosal disease. Electronically Signed   By: Franchot Gallo M.D.   On: 08/26/2019 22:31     Medications:     Scheduled Medications: . sodium chloride   Intravenous Once  . sodium chloride   Intravenous Once  . aspirin  81 mg Per Tube Daily  . atorvastatin  40 mg Per Tube Daily  . B-complex with vitamin C  1 tablet Per Tube Daily  . bisacodyl  10 mg Oral Daily   Or  . bisacodyl  10 mg Rectal Daily  . chlorhexidine gluconate (MEDLINE KIT)  15 mL Mouth Rinse BID  . Chlorhexidine Gluconate Cloth  6 each Topical Daily  . clopidogrel  75 mg Per Tube Daily  . docusate  100 mg Per Tube BID  . feeding supplement (PRO-STAT SUGAR FREE 64)  30 mL Per Tube QID  . fentaNYL (SUBLIMAZE) injection  25 mcg Intravenous Once  . hydrALAZINE  25 mg Per Tube Q8H  . insulin aspart  0-24 Units Subcutaneous Q4H  . isosorbide dinitrate  20 mg Per Tube TID  . lactulose  30 g Oral Daily  . mouth rinse  15 mL Mouth Rinse 10 times per day  . metoCLOPramide (REGLAN) injection  5 mg Intravenous Q8H  . pantoprazole sodium  40 mg Per Tube Daily  . sodium chloride flush  10-40 mL Intracatheter Q12H  . sodium chloride flush  3 mL Intravenous Q12H  . tamsulosin  0.4 mg Oral QPC breakfast  . trospium  20 mg Per Tube BID    Infusions: .  prismasol BGK 4/2.5 500 mL/hr at 08/27/19 0053  . amiodarone 30 mg/hr (08/27/19 0700)  . ceFEPime (MAXIPIME) IV Stopped (08/27/19  0038)  . epinephrine Stopped (08/21/19 1531)  . feeding supplement (VITAL AF 1.2 CAL) 55 mL/hr at 08/26/19 2300  . fentaNYL infusion INTRAVENOUS 50 mcg/hr (08/27/19 0700)  . heparin 700 Units/hr (08/27/19 0700)  . lactated ringers    . lactated ringers    . lactated ringers    . lactated ringers 20 mL/hr at  08/20/19 1200  . magnesium sulfate bolus IVPB    . midazolam Stopped (08/27/19 0556)  . prismasol BGK 2/2.5 replacement solution 300 mL/hr at 08/26/19 1826  . prismasol BGK 4/2.5 1,500 mL/hr at 08/27/19 0424  . propofol (DIPRIVAN) infusion Stopped (08/26/19 1120)  . vancomycin Stopped (08/27/19 0224)    PRN Medications: cromolyn, dextrose, fentaNYL, heparin, heparin, hydrALAZINE, lactated ringers, midazolam, morphine injection, ondansetron (ZOFRAN) IV, oxyCODONE, sodium chloride flush, sodium chloride flush, traMADol     Assessment/Plan   1. CAD: s/p PDA angioplasty + stent in 02/2014.  Admitted for NSTEMI 4/14. Hs Trop peaked at 3,309.  LHC w/ severe 3V CAD. Echo w/ reduced EF 30-35%. RV ok.  S/p CABG x 4 on 4/20 (LIMA-LAD, RIMA to PDA, Lt radial artery graft to 1st OM and distal OM branches of LCx).  Chest reopened urgently at the bedside for cardiac tamponade and removal of clot on 4/23  - continue ASA/Plavix.  Will need to stop one of the antiplatelet agents ultimately as he will be on anticoagulation at home.  - Radial harvest. On isordil 20 tid.  - Atorvastatin 40 daily.   - Chest closure timing per Dr. Orvan Seen, need to get volume off.  2. Acute Systolic Heart Failure: Ischemic Cardiomyopathy -> cardiogenic shock.  LVEF severely reduced at 30-35%, in the setting of NSTEM/ Multivessel CAD, RV ok initially.  Underwent urgent sternotomy at the bedside for cardiac tamponade and removal of clot on 4/23. Chest remains open. Luiz Blare numbers have been concerning for RV failure (swan now out).  Echo 4/24 EF 40 to 45% with inotropic support, small amount of residual pericardial clot, RV  moderately hypokinetic.  Off pressors/inotropes, co-ox 66% today. CVP 10, weight up about 10 lbs above pre-op. - Continue CVVH, net UF 100-150 cc/hr again today.  - Remains on isordil, with BP lower can decrease hydralazine back down to 25 mg tid.    3.  Postoperative acute hypoxic respiratory failure: Reintubated on 4/23 due to cardiogenic shock and return to OR. - Keep intubated/sedated while chest open.  4. AKI: Baseline SCr 0.9. Creatinine peaked to 5.3, suspect ATN in setting of cardiogenic shock/tamponade.  Now on CVVH for volume removal, 170 cc urine yesterday.  - Continue to follow, appreciate nephrology's assistance. 5.  Shock liver: Due to cardiac arrest on 4/23.  LFTs trending down.  6. 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 Maintaining NSR.   - continue PO amio 200 mg twice a day.  - Low dose heparin gtt currently while chest open.   7.  Possible small right lower lobe PE: Chest CT 4/14 w/ small RLL PE.  Bilateral Venous Dopplers negative for DVT.  - Low dose heparin has been started.   - Anticipate switching eliquis prior to d/c.  8. T2DM: Hgb A1c 7.7 -Remains on insulin 9. Pericardial tamponade: 4/23 return to OR for clot removal, chest wall left open. Patient had been on heparin gtt for DCCV of atrial flutter.  - Timing of chest closure per surgery.  10. Acute hypoxemic respiratory failure: Intubated.  Suspect component of pulmonary edema but had emesis this morning with fecal material and concerned he may have aspirated.  - Placing OGT for suction.  - CXR/KUB. 11. Ileus: Suspect ileus with vomiting this morning.  - OGT suction. - KUB - Reglan 5 mg IV every 8 hrs.  12. Neuro: Following commands this morning.  CT head negative.   Length of Stay: [place] Performed by: Loralie Champagne  Total critical care time: 40 minutes  Critical care time was exclusive of separately billable procedures and treating other patients.  Critical care was necessary to treat  or prevent imminent or life-threatening deterioration.  Critical care was time spent personally by me on the following activities: development of treatment plan with patient and/or surrogate as well as nursing, discussions with consultants, evaluation of patient's response to treatment, examination of patient, obtaining history from patient or surrogate, ordering and performing treatments and interventions, ordering and review of laboratory studies, ordering and review of radiographic studies, pulse oximetry and re-evaluation of patient's condition.   Loralie Champagne 08/27/2019 8:03 AM

## 2019-08-27 NOTE — Progress Notes (Signed)
      [address]       [place] [id number]             [phone]                 3 Days Post-Op Procedure(s) (LRB): CHEST WASHOUT POST OPERATIVE OPEN HEART (N/A) APPLICATION OF WOUND VAC (N/A)   Events: No BM Brown NG output   _______________________________________________________________ Vitals: BP 134/60   Pulse (!) 108   Temp 98.8 F (37.1 C)   Resp (!) 35   Ht 5\' 9"  (1.753 m)   Wt 101.5 kg   SpO2 99%   BMI 33.04 kg/m   - Neuro: sedated  - Cardiovascular: sinus  Drips: amio.   CVP:  [4 mmHg-19 mmHg] 14 mmHg  - Pulm:  Vent Mode: PRVC FiO2 (%):  [40 %] 40 % Set Rate:  [18 bmp] 18 bmp Vt Set:  [560 mL] 560 mL PEEP:  [5 cmH20-8 cmH20] 5 cmH20 Plateau Pressure:  [23 cmH20-27 cmH20] 24 cmH20  ABG    Component Value Date/Time   PHART 7.513 (H) 08/20/2019 0404   PCO2ART 34.7 08/20/2019 0404   PO2ART 216 (H) 08/20/2019 0404   HCO3 27.9 08/20/2019 0404   TCO2 29 08/20/2019 0404   ACIDBASEDEF 5.0 (H) 08/18/2019 2341   O2SAT 65.9 08/27/2019 0328    - Abd: soft - Extremity: warm  .Intake/Output      05/01 0701 - 05/02 0700 05/02 0701 - 05/03 0700   I.V. (mL/kg) 994.3 (9.8) 177 (1.7)   NG/GT 1612.5 0   IV Piggyback 450 100   Chest Tube     Total Intake(mL/kg) 3056.7 (30.1) 277 (2.7)   Urine (mL/kg/hr) 170 (0.1)    Emesis/NG output     Drains 100    Other 5275 969   Chest Tube 120    Total Output 5665 969   Net -2608.3 -692           _______________________________________________________________ Labs: CBC Latest Ref Rng & Units 08/27/2019 08/26/2019 08/25/2019  WBC 4.0 - 10.5 K/uL 16.9(H) 13.1(H) 13.5(H)  Hemoglobin 13.0 - 17.0 g/dL 8.6(L) 9.0(L) 9.3(L)  Hematocrit 39.0 - 52.0 % 27.4(L) 28.3(L) 29.2(L)  Platelets 150 - 400 K/uL 236 211 215   CMP Latest Ref Rng & Units 08/27/2019 08/26/2019 08/26/2019  Glucose 70 - 99 mg/dL 206(H) 218(H) 196(H)  BUN 8 - 23 mg/dL 58(H) 57(H) 64(H)  Creatinine 0.61 - 1.24 mg/dL 2.40(H) 2.31(H)  2.52(H)  Sodium 135 - 145 mmol/L 135 138 137  Potassium 3.5 - 5.1 mmol/L 5.1 5.2(H) 4.5  Chloride 98 - 111 mmol/L 102 104 103  CO2 22 - 32 mmol/L 23 23 23   Calcium 8.9 - 10.3 mg/dL 8.6(L) 8.7(L) 8.3(L)  Total Protein 6.5 - 8.1 g/dL - - -  Total Bilirubin 0.3 - 1.2 mg/dL - - -  Alkaline Phos 38 - 126 U/L - - -  AST 15 - 41 U/L - - -  ALT 0 - 44 U/L - - -    CXR: No effusion, L atelectasis  _______________________________________________________________  Assessment and Plan:  s/p CABG, with open chest  Neuro: wean sedation as tolerated CV: on amio. Pulm: vented while chest open Renal: on CRRT GI: emesis, and concern for aspiration Heme: stable ID: on broad abx.  Concern for aspiration Dispo: continue ICU care  Melodie Bouillon, MD 08/27/2019 12:27 PM

## 2019-08-27 NOTE — Progress Notes (Signed)
Patient vomited thick, brown, foul smelling liquid.  Notified Dr. Aundra Dubin; OGT placed to [facility].  Blood, urine and respiratory cultures ordered.

## 2019-08-27 NOTE — Progress Notes (Signed)
[facility] Progress Note  Name: Christian [name] MRN: [id number] DOB: [birth year]  Chief Complaint:  NSTEMI and s/p CABG  Subjective:  Seen and examined on CRRT.  Procedure supervised. had 4.4 liters UF over 5/1 with CRRT.  Has been net neg 100 to 150 ml/hr.  Had 170 mL UOP over 5/1.  Followed some intermittent commands overnight per nursing  Review of systems:  Unable to obtain 2/2 intubated and sedated    Intake/Output Summary (Last 24 hours) at 08/27/2019 0610 Last data filed at 08/27/2019 0600 Gross per 24 hour  Intake 3063.34 ml  Output 5066 ml  Net -2002.66 ml    Vitals:  Vitals:   08/27/19 0515 08/27/19 0530 08/27/19 0545 08/27/19 0600  BP:      Pulse: (!) 101 (!) 101 95 (!) 101  Resp: (!) 21 (!) 21 (!) 25 (!) 22  Temp:      TempSrc:      SpO2: 99% 99% 99% 99%  Weight:      Height:         Physical Exam:  General adult male in bed critically ill intubated HEENT normocephalic atraumatic Lungs coarse mechanical breath sounds - 40 FIO2 and 8 PEEP  Heart regular rate and rhythm  Abdomen soft distended/obese habitus Extremities 1-2+ edema  Neuro sedation running  Access right subclavian nontunneled catheter  Medications reviewed   Labs:  BMP Latest Ref Rng & Units 08/27/2019 08/26/2019 08/26/2019  Glucose 70 - 99 mg/dL 206(H) 218(H) 196(H)  BUN 8 - 23 mg/dL 58(H) 57(H) 64(H)  Creatinine 0.61 - 1.24 mg/dL 2.40(H) 2.31(H) 2.52(H)  BUN/Creat Ratio 10 - 24 - - -  Sodium 135 - 145 mmol/L 135 138 137  Potassium 3.5 - 5.1 mmol/L 5.1 5.2(H) 4.5  Chloride 98 - 111 mmol/L 102 104 103  CO2 22 - 32 mmol/L 23 23 23   Calcium 8.9 - 10.3 mg/dL 8.6(L) 8.7(L) 8.3(L)     Assessment/Plan:   Patient with NSTEMI and status post CABG with course complicated by cardiac arrest and tamponade and cardiogenic shock who has been volume overloaded.  He has developed acute kidney injury and nephrology was consulted for assistance with management of AKI.    # Acute kidney  injury - 2/2 ATN  - On CRRT. Continue UF goal net neg 100 to 150 ml/hr as tolerated On 2K post fluids and others 4K.  - hope to transition to intermittent HD soon - note baseline Cr approximately 1.2  - please transition off of morphine given AKI   # Fluid volume overload - On CRRT to optimize as above  # CAD s/p CABG - per CT surgery  # Acute hypoxic respiratory failure on mechanical ventilation - Optimize volume status with CRRT   # Hypertension - Controlled   # Anemia normocytic  - status post packed red blood cells this admission; no acute indication for PRBC's  Claudia Desanctis, MD 08/27/2019 6:21 AM

## 2019-08-27 NOTE — Progress Notes (Signed)
ANTICOAGULATION CONSULT NOTE - Follow Up Consult  Pharmacy Consult for Heparin Indication:  PE 08/09/19  No Known Allergies  Patient Measurements: Height: 5\' 9"  (175.3 cm) Weight: 101.5 kg (223 lb 12.3 oz) IBW/kg (Calculated) : 70.7 Heparin Dosing Weight: 90.6 kg  Vital Signs: Temp: 98.2 F (36.8 C) (05/02 1600) Temp Source: Core (05/02 1600) BP: 131/55 (05/02 1526) Pulse Rate: 98 (05/02 1600)  Labs: Recent Labs    08/25/19 0326 08/25/19 1531 08/26/19 0335 08/26/19 0339 08/26/19 1028 08/26/19 1546 08/26/19 1919 08/27/19 0328 08/27/19 1611  HGB 9.3*  --  9.0*  --   --   --   --  8.6*  --   HCT 29.2*  --  28.3*  --   --   --   --  27.4*  --   PLT 215  --  211  --   --   --   --  236  --   HEPARINUNFRC  --   --   --    < >   < >  --  0.11* 0.14* 0.23*  CREATININE 3.76*   < >  --    < >  --  2.31*  --  2.40* 2.31*   < > = values in this interval not displayed.    Estimated Creatinine Clearance: 33.9 mL/min (A) (by C-G formula based on SCr of 2.31 mg/dL (H)).  Assessment: 74 year-old male on heparin for NSTEMI, s/p CABG on 4/20. CT chest 4/14 confirmed PE. Dopplers negative for DVT. No anticoagulation prior to admission.   Low-dose heparin drip resumed 4/22 for recent small PE.  4/23 pm patient increased WOB and brady arrest. Chest opened and removed clots from pericardium.  Pt returned to OR 4/29 -wound vac placed. H/h stable 9/29, pltc stable 200s slight drainage from CT. Low dose heparin drip resumed 4/30  5/1 Discussed with Dr. Orvan Seen, will titrate up to a goal level of 0.2-0.3. Evening HL 0.11, H/H and plt stable. Confirmed no bruising or bleeding with RN. Will increase conservatively.   5/2 Heparin level remains below goal on 700 units/hr.  No overt bleeding or complications noted.  CBC fairly stable.  5/2 PM: Heparin level is now therapeutic at 0.23 on 800 units/hr. No overt s/s of bleeding noted.   Goal of Therapy:  Heparin level 0.2-0.3 units/mL  Monitor  platelets by anticoagulation protocol: Yes   Plan:  - Continue IV heparin at 800 units/hr  - Recheck heparin level in 8 hrs. - Daily heparin level and CBC - Will continue to monitor for any signs/symptoms of bleeding  - Planning to start Eliquis once patient stable   Albertina Parr, PharmD., BCPS, BCCCP Clinical Pharmacist Clinical phone for 08/27/19 until 10pm: [phone] If after 10pm, please refer to [facility] for unit-specific pharmacist

## 2019-08-27 NOTE — Progress Notes (Addendum)
Pharmacy Antibiotic Note  Christian [name] is a 73 y.o. male admitted on 08/09/2019 with open chest.  Pharmacy has been consulted for Vancomycin and Cefepime dosing. CRRT continues. 5/5 VT 30 > goal will hold dose tonight and restart at lower dose Has been back and forth to OR for would washout - CRRT stops so vancomycin may have accumulated some   Plan: Decrease Vancomycin 1GM IV every 24 hours - start tomorrow night Continue Cefepime to 2g IV every 12 hours. Follow-up toleration of CRRT and Nephrology plans.    Height: 5\' 9"  (175.3 cm) Weight: 101.5 kg (223 lb 12.3 oz) IBW/kg (Calculated) : 70.7  Temp (24hrs), Avg:99.2 F (37.3 C), Min:97.7 F (36.5 C), Max:100 F (37.8 C)  Recent Labs  Lab 08/22/19 0444 08/22/19 1330 08/23/19 0552 08/23/19 0552 08/24/19 0405 08/24/19 0405 08/24/19 1837 08/25/19 0326 08/25/19 1531 08/26/19 0335 08/26/19 0339 08/26/19 1546 08/27/19 0328  WBC   < >  --  11.1*  --  12.2*  --   --  13.5*  --  13.1*  --   --  16.9*  CREATININE   < > 4.68* 4.64*   < > 5.27*   < >  --  3.76* 3.01*  --  2.52* 2.31* 2.40*  VANCORANDOM  --  22  --   --   --   --  16  --   --   --   --   --   --    < > = values in this interval not displayed.    Estimated Creatinine Clearance: 32.7 mL/min (A) (by C-G formula based on SCr of 2.4 mg/dL (H)).    No Known Allergies  Antimicrobials this admission: Vancomycin 4/23 >> Cefepime 4/23 >>  Microbiology results: 4/22 BCx negative 4/19 MRSA/Staph PCR negative 4/14 COVID/Flu negative   Bonnita Nasuti Pharm.D. CPP, BCPS Clinical Pharmacist [phone] 08/30/2019 10:40 PM

## 2019-08-27 NOTE — Plan of Care (Signed)
Pt had a medium-sized BM this evening (mushy but formed). Tube feedings have been off since this AM d/t emesis. Will pass on to day team in AM. Problem: Elimination: Goal: Will not experience complications related to bowel motility 08/27/2019 2219 by Henreitta Leber, RN Outcome: Progressing 08/27/2019 2219 by Henreitta Leber, RN Outcome: Progressing   Problem: Elimination: Goal: Will not experience complications related to urinary retention 08/27/2019 2219 by Henreitta Leber, RN Outcome: Progressing 08/27/2019 2219 by Henreitta Leber, RN Outcome: Progressing

## 2019-08-27 NOTE — Progress Notes (Signed)
Pt having dark brown emesis and sputum sample showed the same. Pt possibly aspirated. Sputum sample sent to lab. MD and RN made aware

## 2019-08-27 NOTE — Progress Notes (Signed)
ANTICOAGULATION CONSULT NOTE - Follow Up Consult  Pharmacy Consult for Heparin Indication:  PE 08/09/19  No Known Allergies  Patient Measurements: Height: 5\' 9"  (585.2 cm) Weight: 101.5 kg (223 lb 12.3 oz) IBW/kg (Calculated) : 70.7 Heparin Dosing Weight: 90.6 kg  Vital Signs: Temp: 98.6 F (37 C) (05/02 0735) Temp Source: Axillary (05/02 0715) BP: 127/53 (05/02 0735) Pulse Rate: 102 (05/02 0735)  Labs: Recent Labs    08/25/19 0326 08/25/19 1531 08/26/19 0335 08/26/19 0339 08/26/19 1028 08/26/19 1546 08/26/19 1919 08/27/19 0328  HGB 9.3*  --  9.0*  --   --   --   --  8.6*  HCT 29.2*  --  28.3*  --   --   --   --  27.4*  PLT 215  --  211  --   --   --   --  236  HEPARINUNFRC  --   --   --   --  <0.10*  --  0.11* 0.14*  CREATININE 3.76*   < >  --  2.52*  --  2.31*  --  2.40*   < > = values in this interval not displayed.    Estimated Creatinine Clearance: 32.7 mL/min (A) (by C-G formula based on SCr of 2.4 mg/dL (H)).  Assessment: 74 year-old male on heparin for NSTEMI, s/p CABG on 4/20. CT chest 4/14 confirmed PE. Dopplers negative for DVT. No anticoagulation prior to admission.   Low-dose heparin drip resumed 4/22 for recent small PE.  4/23 pm patient increased WOB and brady arrest. Chest opened and removed clots from pericardium.  Pt returned to OR 4/29 -wound vac placed. H/h stable 9/29, pltc stable 200s slight drainage from CT. Low dose heparin drip resumed 4/30  5/1 Discussed with Dr. Orvan Seen, will titrate up to a goal level of 0.2-0.3. Evening HL 0.11, H/H and plt stable. Confirmed no bruising or bleeding with RN. Will increase conservatively.   5/2 Heparin level remains below goal on 700 units/hr.  No overt bleeding or complications noted.  CBC fairly stable.  Goal of Therapy:  Heparin level 0.2-0.3 units/mL  Monitor platelets by anticoagulation protocol: Yes   Plan:  - Increase IV heparin to 800 units/hr  - Recheck heparin level in 8 hrs. - Daily heparin  level and CBC - Will continue to monitor for any signs/symptoms of bleeding  - Planning to start Eliquis once patient stable   Marguerite Olea, [place] Pharmacist Phone [phone]  08/27/2019 8:48 AM

## 2019-08-27 NOTE — Plan of Care (Signed)
  Problem: Education: Goal: Knowledge of General Education information will improve Description: Including pain rating scale, medication(s)/side effects and non-pharmacologic comfort measures Outcome: Progressing   Problem: Health Behavior/Discharge Planning: Goal: Ability to manage health-related needs will improve Outcome: Progressing   Problem: Clinical Measurements: Goal: Ability to maintain clinical measurements within normal limits will improve Outcome: Progressing Goal: Will remain free from infection Outcome: Progressing Goal: Diagnostic test results will improve Outcome: Progressing Goal: Respiratory complications will improve Outcome: Progressing Goal: Cardiovascular complication will be avoided Outcome: Progressing   Problem: Activity: Goal: Risk for activity intolerance will decrease Outcome: Progressing   Problem: Nutrition: Goal: Adequate nutrition will be maintained Outcome: Progressing   Problem: Coping: Goal: Level of anxiety will decrease Outcome: Progressing   Problem: Elimination: Goal: Will not experience complications related to bowel motility Outcome: Progressing Goal: Will not experience complications related to urinary retention Outcome: Progressing   Problem: Pain Managment: Goal: General experience of comfort will improve Outcome: Progressing   Problem: Safety: Goal: Ability to remain free from injury will improve Outcome: Progressing   Problem: Skin Integrity: Goal: Risk for impaired skin integrity will decrease Outcome: Progressing   Problem: Education: Goal: Will demonstrate proper wound care and an understanding of methods to prevent future damage Outcome: Progressing Goal: Knowledge of disease or condition will improve Outcome: Progressing Goal: Knowledge of the prescribed therapeutic regimen will improve Outcome: Progressing Goal: Individualized Educational Video(s) Outcome: Progressing   Problem: Activity: Goal: Risk for  activity intolerance will decrease Outcome: Progressing   Problem: Cardiac: Goal: Will achieve and/or maintain hemodynamic stability Outcome: Progressing   Problem: Clinical Measurements: Goal: Postoperative complications will be avoided or minimized Outcome: Progressing   Problem: Respiratory: Goal: Respiratory status will improve Outcome: Progressing   Problem: Skin Integrity: Goal: Wound healing without signs and symptoms of infection Outcome: Progressing Goal: Risk for impaired skin integrity will decrease Outcome: Progressing   Problem: Urinary Elimination: Goal: Ability to achieve and maintain adequate renal perfusion and functioning will improve Outcome: Progressing   

## 2019-08-28 ENCOUNTER — Inpatient Hospital Stay (HOSPITAL_COMMUNITY): Payer: Medicare Other

## 2019-08-28 DIAGNOSIS — L8990 Pressure ulcer of unspecified site, unspecified stage: Secondary | ICD-10-CM | POA: Insufficient documentation

## 2019-08-28 DIAGNOSIS — R570 Cardiogenic shock: Secondary | ICD-10-CM | POA: Diagnosis not present

## 2019-08-28 DIAGNOSIS — J9601 Acute respiratory failure with hypoxia: Secondary | ICD-10-CM | POA: Diagnosis not present

## 2019-08-28 DIAGNOSIS — I5023 Acute on chronic systolic (congestive) heart failure: Secondary | ICD-10-CM | POA: Diagnosis not present

## 2019-08-28 DIAGNOSIS — N179 Acute kidney failure, unspecified: Secondary | ICD-10-CM | POA: Diagnosis not present

## 2019-08-28 DIAGNOSIS — I214 Non-ST elevation (NSTEMI) myocardial infarction: Secondary | ICD-10-CM | POA: Diagnosis not present

## 2019-08-28 LAB — TYPE AND SCREEN
ABO/RH(D): O POS
ABO/RH(D): O POS
Antibody Screen: NEGATIVE
Antibody Screen: NEGATIVE
Unit division: 0
Unit division: 0

## 2019-08-28 LAB — BPAM RBC
Blood Product Expiration Date: 202105292359
Blood Product Expiration Date: 202105292359
Unit Type and Rh: 5100
Unit Type and Rh: 5100

## 2019-08-28 LAB — CBC
HCT: 28.5 % — ABNORMAL LOW (ref 39.0–52.0)
Hemoglobin: 8.8 g/dL — ABNORMAL LOW (ref 13.0–17.0)
MCH: 29.6 pg (ref 26.0–34.0)
MCHC: 30.9 g/dL (ref 30.0–36.0)
MCV: 96 fL (ref 80.0–100.0)
Platelets: 249 10*3/uL (ref 150–400)
RBC: 2.97 MIL/uL — ABNORMAL LOW (ref 4.22–5.81)
RDW: 17.3 % — ABNORMAL HIGH (ref 11.5–15.5)
WBC: 17.4 10*3/uL — ABNORMAL HIGH (ref 4.0–10.5)
nRBC: 0.1 % (ref 0.0–0.2)

## 2019-08-28 LAB — MAGNESIUM: Magnesium: 2.9 mg/dL — ABNORMAL HIGH (ref 1.7–2.4)

## 2019-08-28 LAB — RENAL FUNCTION PANEL
Albumin: 2.1 g/dL — ABNORMAL LOW (ref 3.5–5.0)
Albumin: 2.2 g/dL — ABNORMAL LOW (ref 3.5–5.0)
Anion gap: 13 (ref 5–15)
Anion gap: 13 (ref 5–15)
BUN: 44 mg/dL — ABNORMAL HIGH (ref 8–23)
BUN: 44 mg/dL — ABNORMAL HIGH (ref 8–23)
CO2: 23 mmol/L (ref 22–32)
CO2: 23 mmol/L (ref 22–32)
Calcium: 8.8 mg/dL — ABNORMAL LOW (ref 8.9–10.3)
Calcium: 8.9 mg/dL (ref 8.9–10.3)
Chloride: 101 mmol/L (ref 98–111)
Chloride: 102 mmol/L (ref 98–111)
Creatinine, Ser: 2.43 mg/dL — ABNORMAL HIGH (ref 0.61–1.24)
Creatinine, Ser: 2.46 mg/dL — ABNORMAL HIGH (ref 0.61–1.24)
GFR calc Af Amer: 30 mL/min — ABNORMAL LOW (ref >60)
GFR calc Af Amer: 29 mL/min — ABNORMAL LOW (ref >60)
GFR calc non Af Amer: 26 mL/min — ABNORMAL LOW (ref >60)
GFR calc non Af Amer: 25 mL/min — ABNORMAL LOW (ref >60)
Glucose, Bld: 112 mg/dL — ABNORMAL HIGH (ref 70–99)
Glucose, Bld: 131 mg/dL — ABNORMAL HIGH (ref 70–99)
Phosphorus: 3.9 mg/dL (ref 2.5–4.6)
Phosphorus: 3.8 mg/dL (ref 2.5–4.6)
Potassium: 4.7 mmol/L (ref 3.5–5.1)
Potassium: 4.5 mmol/L (ref 3.5–5.1)
Sodium: 137 mmol/L (ref 135–145)
Sodium: 138 mmol/L (ref 135–145)

## 2019-08-28 LAB — COOXEMETRY PANEL
Carboxyhemoglobin: 0.9 % (ref 0.5–1.5)
Methemoglobin: 0.6 % (ref 0.0–1.5)
O2 Saturation: 67.3 %
Total hemoglobin: 8.6 g/dL — ABNORMAL LOW (ref 12.0–16.0)

## 2019-08-28 LAB — HEPARIN LEVEL (UNFRACTIONATED): Heparin Unfractionated: 0.23 IU/mL — ABNORMAL LOW (ref 0.30–0.70)

## 2019-08-28 LAB — GLUCOSE, CAPILLARY
Glucose-Capillary: 106 mg/dL — ABNORMAL HIGH (ref 70–99)
Glucose-Capillary: 110 mg/dL — ABNORMAL HIGH (ref 70–99)
Glucose-Capillary: 129 mg/dL — ABNORMAL HIGH (ref 70–99)
Glucose-Capillary: 118 mg/dL — ABNORMAL HIGH (ref 70–99)
Glucose-Capillary: 98 mg/dL (ref 70–99)

## 2019-08-28 MED ORDER — HYDROMORPHONE HCL 1 MG/ML IJ SOLN
1.0000 mg | INTRAMUSCULAR | Status: DC | PRN
  Administered 2019-08-29 – 2019-10-11 (×14): 1 mg via INTRAVENOUS
  Filled 2019-08-28 (×17): qty 1

## 2019-08-28 MED ORDER — OXYCODONE HCL 5 MG PO TABS
5.0000 mg | ORAL_TABLET | ORAL | Status: DC | PRN
  Administered 2019-08-28 – 2019-09-17 (×17): 5 mg
  Filled 2019-08-28 (×17): qty 1

## 2019-08-28 MED ORDER — OXYCODONE HCL 5 MG PO TABS: 5.0000 mg | ORAL_TABLET | ORAL | Status: DC | PRN

## 2019-08-28 MED ORDER — PRO-STAT SUGAR FREE PO LIQD
60.0000 mL | Freq: Two times a day (BID) | ORAL | Status: DC
  Administered 2019-08-28: 60 mL
  Administered 2019-08-28: 30 mL
  Administered 2019-08-29 – 2019-08-31 (×4): 60 mL
  Filled 2019-08-28 (×6): qty 60

## 2019-08-28 MED ORDER — LACTULOSE 10 GM/15ML PO SOLN: 30.0000 g | Freq: Every day | ORAL | Status: DC

## 2019-08-28 MED ORDER — VITAL AF 1.2 CAL PO LIQD
1000.0000 mL | ORAL | Status: DC
  Administered 2019-08-29 – 2019-08-30 (×2): 1000 mL

## 2019-08-28 NOTE — Progress Notes (Addendum)
OG changed to back to low intermittent suction per verbal order from Dr. Prescott Gum   put out 66ml immediately upon hooking up suction.

## 2019-08-28 NOTE — Progress Notes (Signed)
Patient ID: Christian [name], male   DOB: 11-07-1945, 74 y.o.   MRN: [id number] S: Has tolerated CRRT with UF.  CVP now down to 9. O:BP (!) 95/41   Pulse 75   Temp 98.8 F (37.1 C)   Resp (!) 25   Ht _0  (1.753 m)   Wt 96.3 kg   SpO2 100%   BMI 31.35 kg/m   Intake/Output Summary (Last 24 hours) at 08/28/2019 0842 Last data filed at 08/28/2019 0800 Gross per 24 hour  Intake 1497.73 ml  Output 5376 ml  Net -3878.27 ml   Intake/Output: I/O last 3 completed shifts: In: 3288.2 [I.V.:1465.7; NG/GT:1022.5; IV Piggyback:800.1] Out: 6962 [Urine:40; Emesis/NG output:600; Drains:100; XBMWU:1324; Chest Tube:110]  Intake/Output this shift:  Total I/O In: 27 [I.V.:27] Out: -  Weight change: -5.2 kg Gen: intubated and sedated CVS: no rub Chest- wound vac in place Resp: occ rhonchi Abd: distended, tense, hypoactive bowel sounds Ext: 1+ edema of upper extremities  Recent Labs  Lab 08/22/19 0444 08/22/19 1330 08/23/19 0552 08/23/19 0552 08/24/19 0405 08/24/19 0405 08/25/19 0326 08/25/19 1531 08/26/19 0339 08/26/19 1546 08/27/19 0328 08/27/19 1611 08/28/19 0424  NA 141   < > 138   < > 140   < > 138 137 137 138 135 137 137  K 3.7   < > 4.0   < > 4.2   < > 4.5 4.7 4.5 5.2* 5.1 5.2* 4.7  CL 103   < > 103   < > 102   < > 102 100 103 104 102 101 101  CO2 24   < > 22   < > 22   < > _1 GLUCOSE 125*   < > 294*   < > 204*   < > 171* 166* 196* 218* 206* 134* 112*  BUN 74*   < > 90*   < > 114*   < > 93* 74* 64* 57* 58* 46* 44*  CREATININE 4.55*   < > 4.64*   < > 5.27*   < > 3.76* 3.01* 2.52* 2.31* 2.40* 2.31* 2.43*  ALBUMIN 2.0*  --  1.5*   < > 1.6*   < > 1.8* 2.0* 1.8* 1.9* 2.0* 2.2* 2.1*  CALCIUM 8.4*   < > 7.6*   < > 8.2*   < > 8.4* 8.5* 8.3* 8.7* 8.6* 9.0 8.8*  PHOS 6.8*  --  6.7*   < >  --   --  6.2* 4.9* 4.2 4.2 3.9 4.0 3.9  AST 85*  --  50*  --  59*  --   --   --   --   --   --   --   --   ALT 495*  --  220*  --  169*  --   --   --   --   --   --   --   --    <  > = values in this interval not displayed.   Liver Function Tests: Recent Labs  Lab 08/22/19 0444 08/22/19 0444 08/23/19 0552 08/23/19 0552 08/24/19 0405 08/25/19 0326 08/27/19 0328 08/27/19 1611 08/28/19 0424  AST 85*  --  50*  --  59*  --   --   --   --   ALT 495*  --  220*  --  169*  --   --   --   --   ALKPHOS 86  --  82  --  115  --   --   --   --   BILITOT 0.9  --  0.8  --  0.7  --   --   --   --   PROT 5.0*  --  4.2*  --  4.3*  --   --   --   --   ALBUMIN 2.0*   < > 1.5*   < > 1.6*   < > 2.0* 2.2* 2.1*   < > = values in this interval not displayed.   No results for input(s): LIPASE, AMYLASE in the last 168 hours. No results for input(s): AMMONIA in the last 168 hours. CBC: Recent Labs  Lab 08/23/19 0552 08/23/19 0552 08/24/19 0405 08/24/19 0405 08/25/19 0326 08/26/19 0335 08/27/19 0328  WBC 11.1*   < > 12.2*   < > 13.5* 13.1* 16.9*  HGB 8.5*   < > 8.6*   < > 9.3* 9.0* 8.6*  HCT 27.1*   < > 27.2*   < > 29.2* 28.3* 27.4*  MCV 94.4  --  94.1  --  94.5 95.6 95.1  PLT 151   < > 171   < > 215 211 236   < > = values in this interval not displayed.   Cardiac Enzymes: No results for input(s): CKTOTAL, CKMB, CKMBINDEX, TROPONINI in the last 168 hours. CBG: Recent Labs  Lab 08/27/19 1550 08/27/19 2030 08/27/19 2332 08/28/19 0433 08/28/19 0806  GLUCAP 124* 104* 126* 106* 110*    Iron Studies: No results for input(s): IRON, TIBC, TRANSFERRIN, FERRITIN in the last 72 hours. Studies/Results: DG Abd 1 View  Result Date: 08/27/2019 CLINICAL DATA:  Possible ileus. EXAM: ABDOMEN - 1 VIEW COMPARISON:  08/18/2019 FINDINGS: Two enteric tubes are present with both tips just right of midline likely over the distal stomach. Bowel gas pattern is nonobstructive. Air is present throughout the colon. There are a few air-filled nondilated small bowel loops in the right lower quadrant. No free peritoneal air. Remainder the exam is unchanged. IMPRESSION: 1. Nonspecific, nonobstructive  bowel gas pattern with a few air-filled nondilated small bowel loops over the right lower quadrant. No free air. 2.  Tubes and lines as described. Electronically Signed   By: Marin Olp M.D.   On: 08/27/2019 09:59   CT HEAD WO CONTRAST  Result Date: 08/26/2019 CLINICAL DATA:  Acute neuro deficit.  Left-sided weakness EXAM: CT HEAD WITHOUT CONTRAST TECHNIQUE: Contiguous axial images were obtained from the base of the skull through the vertex without intravenous contrast. COMPARISON:  MRI head 12/26/2018 FINDINGS: Brain: Generalized atrophy and ventricular enlargement unchanged. Negative for acute infarct, hemorrhage, mass Vascular: Normal arterial flow voids. Skull: Negative Sinuses/Orbits: Air-fluid level in the sphenoid sinus. Bilateral mastoid effusion. NG tube in place. Mild mucosal edema maxillary sinus bilaterally. Other: None IMPRESSION: Generalized atrophy without acute intracranial abnormality. Sinus mucosal disease. Electronically Signed   By: Franchot Gallo M.D.   On: 08/26/2019 22:31   DG CHEST PORT 1 VIEW  Result Date: 08/27/2019 CLINICAL DATA:  Possible ileus. EXAM: PORTABLE CHEST 1 VIEW COMPARISON:  08/26/2019 FINDINGS: Endotracheal tube has tip 3 cm above the carina. Two enteric tubes course into the stomach and off the film as tip not visualized. Right subclavian central venous catheter and right IJ central venous sheath unchanged. Bilateral chest tubes unchanged. Lungs are adequately inflated with mild hazy left base opacification unchanged and possibly due to atelectasis and less likely infection. Improved hazy prominence of the right infrahilar vessels. No  pneumothorax. Stable cardiomegaly. Remainder of the exam is unchanged. IMPRESSION: 1. Stable left base opacification likely atelectasis and less likely infection. Stable cardiomegaly. 2.  Tubes and lines as described. Electronically Signed   By: Marin Olp M.D.   On: 08/27/2019 09:58   . sodium chloride   Intravenous Once  .  sodium chloride   Intravenous Once  . aspirin  81 mg Per Tube Daily  . atorvastatin  40 mg Per Tube Daily  . B-complex with vitamin C  1 tablet Per Tube Daily  . bisacodyl  10 mg Oral Daily   Or  . bisacodyl  10 mg Rectal Daily  . chlorhexidine gluconate (MEDLINE KIT)  15 mL Mouth Rinse BID  . Chlorhexidine Gluconate Cloth  6 each Topical Daily  . clopidogrel  75 mg Per Tube Daily  . docusate  100 mg Per Tube BID  . feeding supplement (PRO-STAT SUGAR FREE 64)  30 mL Per Tube QID  . fentaNYL (SUBLIMAZE) injection  25 mcg Intravenous Once  . insulin aspart  0-24 Units Subcutaneous Q4H  . isosorbide dinitrate  20 mg Per Tube TID  . lactulose  30 g Oral Daily  . mouth rinse  15 mL Mouth Rinse 10 times per day  . metoCLOPramide (REGLAN) injection  5 mg Intravenous Q8H  . pantoprazole sodium  40 mg Per Tube Daily  . sodium chloride flush  10-40 mL Intracatheter Q12H  . sodium chloride flush  3 mL Intravenous Q12H  . tamsulosin  0.4 mg Oral QPC breakfast  . trospium  20 mg Per Tube BID    BMET    Component Value Date/Time   NA 137 08/28/2019 0424   NA 141 06/19/2019 1130   K 4.7 08/28/2019 0424   CL 101 08/28/2019 0424   CO2 23 08/28/2019 0424   GLUCOSE 112 (H) 08/28/2019 0424   BUN 44 (H) 08/28/2019 0424   BUN 21 06/19/2019 1130   CREATININE 2.43 (H) 08/28/2019 0424   CALCIUM 8.8 (L) 08/28/2019 0424   GFRNONAA 26 (L) 08/28/2019 0424   GFRAA 30 (L) 08/28/2019 0424   CBC    Component Value Date/Time   WBC 16.9 (H) 08/27/2019 0328   RBC 2.88 (L) 08/27/2019 0328   HGB 8.6 (L) 08/27/2019 0328   HCT 27.4 (L) 08/27/2019 0328   PLT 236 08/27/2019 0328   MCV 95.1 08/27/2019 0328   MCH 29.9 08/27/2019 0328   MCHC 31.4 08/27/2019 0328   RDW 17.2 (H) 08/27/2019 0328   LYMPHSABS 1.0 08/17/2019 0353   MONOABS 1.5 (H) 08/17/2019 0353   EOSABS 0.1 08/17/2019 0353   BASOSABS 0.0 08/17/2019 0353     Assessment/Plan:  1. AKI- following cardiac cath, cabg, cardiogenic shock,  cardiac arrest due to tamponade, and acute on chronic CHF.  Started CRRT on 08/24/19 due to anasarca and need for volume removal to close his open chest. 1. Continue with CRRT 2. Volume improved, will decrease UF goal with CVP of 9 and low BP. 2. CAD s/p CABG x 4 complicated by cardiac tamponade s/p reopened chest urgently on 08/18/19 with wound vac. 3. Acute systolic heart failure due to ischemic cardiomyopathy- volume markedly improved with CRRT. 4. Postoperative acute hypoxic respiratory failure- reintubated 08/18/19 and likely aspiration. 5. Anemia- stable 6. Ileus- had BM last night but still distended and tense.  Per primary.   Donetta Potts, MD [employer] [phone]

## 2019-08-28 NOTE — Progress Notes (Addendum)
Nutrition Follow-up  DOCUMENTATION CODES:   Not applicable  INTERVENTION:   Tube Feeding:  -Vital AF 1.2 at 20 ml/hr via Cortrak -Advance 10 ml q4 hours to goal rate of 65 ml/hr (1560 ml) -Pro-Stat 60 mL BID  Provides 2272 kcals, 177 g of protein and 1265 mL of free water Meets 100% estimated needs  Continue B-complex with C while on CRRT  NUTRITION DIAGNOSIS:   Increased nutrient needs related to post-op healing as evidenced by estimated needs.  Ongoing  GOAL:   Patient will meet greater than or equal to 90% of their needs  Addressed via TF  MONITOR:   Vent status, Skin, TF tolerance, Weight trends, I & O's, Labs  REASON FOR ASSESSMENT:   Ventilator    ASSESSMENT:   Patient with PMH significant for CAD s/p stenting, HTN, HLD, DM, and BPH. Presents this admission with RLL PE and for CABG.   4/14- admitted  4/20- CABG x 4, chest left open 4/22- chest closure, application of incisional wound vac 4/23- extubated, cardiac arrest, chest re-opened for washout, clot removed, re-Intubated 4/26- return to OR for washout, cortrak placed, TF initiated 4/29- chest washout, application of wound VAC, CRRT initiated  Pt discussed during ICU rounds and with RN.   Remains on CRRT. Continues with VAC to sternum. Plan eventual closure this week and possible tracheostomy placement. Had episode of vomiting yesterday. OGT to suction. Cortrak advanced post-pyloric. Plan titration of TF to goal.   Admission weight: 97 kg  EDW: 95.5 kg  Current weight: 96.3 kg   Patient remains intubated on ventilator support MV: 15.7 L/min Temp (24hrs), Avg:98.1 F (36.7 C), Min:96.6 F (35.9 C), Max:99.7 F (37.6 C)   I/O: -10,905 ml since 4/19 OGT: 900 ml x 24 hrs  Wound VAC: 50 ml x 24 hrs  CRRT: 4,949 ml x 24 hrs  Chest tubes: 60 ml x 24 hrs   Medications: b complex with vitamin C, dulcolax, colace, SS novolog, lactulose, 5 mg reglan TID Labs: Cr 2.43-trending up Mg 2.9 (H) CBG  104-218   Diet Order:   Diet Order            Diet NPO time specified  Diet effective now              EDUCATION NEEDS:   Not appropriate for education at this time  Skin:  Skin Assessment: Skin Integrity Issues: Skin Integrity Issues:: Other (Comment), Incisions, Wound VAC Wound Vac: sternum Incisions: L arm Other: n/a  Last BM:  5/2  Height:   Ht Readings from Last 1 Encounters:  08/28/19 5\' 9"  (1.753 m)    Weight:   Wt Readings from Last 1 Encounters:  08/28/19 96.3 kg   BMI:  Body mass index is 31.35 kg/m.  Estimated Nutritional Needs:   Kcal:  2243 kcal  Protein:  175-195 grams  Fluid:  >/= 2 L/day   Mariana Single RD, LDN Clinical Nutrition Pager listed in [place]

## 2019-08-28 NOTE — Progress Notes (Signed)
ANTICOAGULATION CONSULT NOTE - Follow Up Consult  Pharmacy Consult for heparin Indication: pulmonary embolus  Labs: Recent Labs    08/25/19 0326 08/25/19 1531 08/26/19 0335 08/26/19 0339 08/26/19 1546 08/26/19 1919 08/27/19 0328 08/27/19 1611 08/27/19 2335  HGB 9.3*  --  9.0*  --   --   --  8.6*  --   --   HCT 29.2*  --  28.3*  --   --   --  27.4*  --   --   PLT 215  --  211  --   --   --  236  --   --   HEPARINUNFRC  --   --   --    < >  --    < > 0.14* 0.23* 0.17*  CREATININE 3.76*   < >  --    < > 2.31*  --  2.40* 2.31*  --    < > = values in this interval not displayed.    Assessment: 74yo male subtherapeutic on heparin after one level at goal; no gtt issues or signs of bleeding per RN.  Goal of Therapy:  Heparin level 0.2-0.3 units/ml   Plan:  Will increase heparin gtt slightly to 850 units/hr and check level in 8 hours.    Wynona Neat, PharmD, BCPS  08/28/2019,12:22 AM

## 2019-08-28 NOTE — Progress Notes (Signed)
NAME:  Christian [name], MRN:  [id number], DOB:  05-26-1945, LOS: 46 ADMISSION DATE:  08/09/2019, CONSULTATION DATE:  08/28/2019 REFERRING MD:  Orvan Seen - [facility], CHIEF COMPLAINT:  Respiratory failure.    HPI/course in hospital   74 year old man who presented with progressive dyspnea.  He was admitted 4/14 and was incidentally found to have a right lower lobe pulmonary embolism.  Overall the picture was that of decompensated heart failure with a non-STEMI.  Coronary angiography revealed triple-vessel disease and the patient was referred for cardiac surgery.  EF was found to be reduced at that time at  He underwent four-vessel CABG 4/20 with good quality distal targets.  Cardiac function remained reduced post bypass.  Unable to close chest due to surrounding edema.  Underwent attempted closure with VAC placement 4/22.   Cardiac arrest 4/23 with repeated washouts with VAC placements on 4/26 and 4/29.  Significant volume overload postoperatively.  Acute kidney injury.  Currently on CRRT for fluid removal.   Past Medical History   Past Medical History:  Diagnosis Date  . BPH (benign prostatic hypertrophy)   . Coronary artery disease   . Diabetes mellitus without complication ([place])   . Dry eyes left  . Hiatal hernia   . 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  LIMA TO LAD, RIMA TO PDA, RADIAL ARTERY TO CIRC AND SEQUENTIALLY TO OM1. 08/15/2019  . Hyperlipidemia   . Hypertension   . Incomplete bladder emptying   . Nocturia   . 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 SOB (shortness of breath) on exertion      Past Surgical History:  Procedure Laterality Date  . APPLICATION OF WOUND VAC N/A 08/24/2019   Procedure: APPLICATION OF WOUND VAC;  Surgeon: [name], MD;  Location: [facility];  Service: Thoracic;  Laterality: N/A;  . CARDIAC CATHETERIZATION      . CORONARY ARTERY BYPASS GRAFT N/A 08/15/2019   Procedure: CORONARY ARTERY BYPASS GRAFTING (CABG), x 4 using bilateral IMAs and left radial artery .  LIMA TO LAD, RIMA TO PDA, RADIAL ARTERY TO CIRC AND SEQUENTIALLY TO OM1.;  Surgeon: [name], MD;  Location: [place];  Service: Open Heart Surgery;  Laterality: N/A;  . CORONARY STENT PLACEMENT  02/27/2014   distal rt/pd coronary       dr Einar Gip  . CYSTO/ BLADDER BIOPSY'S/ CAUTHERIZATION  01-14-2004  DR Gaynelle Arabian  . EXPLORATION POST OPERATIVE OPEN HEART N/A 08/16/2019   Procedure: Chest Closure S?P CABG WITH APPLICATION OF PREVENA  INCISIONAL WOUND VAC;  Surgeon: [name], MD;  Location: [facility];  Service: Open Heart Surgery;  Laterality: N/A;  . EXPLORATION POST OPERATIVE OPEN HEART N/A 08/21/2019   Procedure: CHEST WASHOUT S/P OPEN CHEST;  Surgeon: [name], MD;  Location: [place];  Service: Open Heart Surgery;  Laterality: N/A;  Open chest with Esmark dressing with Ioban sealant coverage.  . EXPLORATION POST OPERATIVE OPEN HEART N/A 08/18/2019   Procedure: EXPLORATION POST OPERATIVE OPEN HEART (performed 04/23 on unit);  Surgeon: [name], MD;  Location: [place];  Service: Open Heart Surgery;  Laterality: N/A;  . EXPLORATION POST OPERATIVE OPEN HEART N/A 08/24/2019   Procedure: CHEST WASHOUT POST OPERATIVE OPEN HEART;  Surgeon: [name], MD;  Location: [place];  Service: Open Heart Surgery;  Laterality: N/A;  . LEFT HEART CATH AND CORONARY ANGIOGRAPHY N/A 08/10/2019   Procedure: LEFT HEART CATH AND CORONARY ANGIOGRAPHY;  Surgeon: Nigel Mormon, MD;  Location: [place] [facility];  Service: Cardiovascular;  Laterality: N/A;  . LEFT HEART CATHETERIZATION WITH CORONARY ANGIOGRAM N/A 02/27/2014   Procedure: LEFT HEART CATHETERIZATION WITH CORONARY ANGIOGRAM;  Surgeon: Laverda Page, MD;  Location: [facility] CATH LAB;  Service: Cardiovascular;  Laterality: N/A;  . PERCUTANEOUS CORONARY STENT INTERVENTION (PCI-S)   02/27/2014   Procedure: PERCUTANEOUS CORONARY STENT INTERVENTION (PCI-S);  Surgeon: Laverda Page, MD;  Location: [facility] CATH LAB;  Service: Cardiovascular;;  rt PDA  3.0/28mm Promus stent  . RADIAL ARTERY HARVEST Left 08/15/2019   Procedure: Radial Artery Harvest;  Surgeon: [name], MD;  Location: [place];  Service: Open Heart Surgery;  Laterality: Left;  . TEE WITHOUT CARDIOVERSION N/A 08/15/2019   Procedure: TRANSESOPHAGEAL ECHOCARDIOGRAM (TEE);  Surgeon: [name], MD;  Location: [place];  Service: Open Heart Surgery;  Laterality: N/A;  . TRANSURETHRAL RESECTION OF PROSTATE  04/04/2012   Procedure: TRANSURETHRAL RESECTION OF THE PROSTATE WITH GYRUS INSTRUMENTS;  Surgeon: Ailene Rud, MD;  Location: [facility];  Service: Urology;  Laterality: N/A;  . TRANSURETHRAL RESECTION OF PROSTATE N/A 09/27/2014   Procedure: TRANSURETHRAL RESECTION OF THE PROSTATE ;  Surgeon: Carolan Clines, MD;  Location: [facility];  Service: Urology;  Laterality: N/A;  . UPPER GASTROINTESTINAL ENDOSCOPY       Review of Systems:   Review of Systems  Unable to perform ROS: Critical illness    Social History   reports that he quit smoking about 29 years ago. His smoking use included cigarettes. He has a 10.00 pack-year smoking history. He has never used smokeless tobacco. He reports current alcohol use. He reports that he does not use drugs.   Family History   His family history includes Diabetes in his brother, brother, father, and mother; Hypertension in his brother and mother.   Allergies No Known Allergies   Home Medications  Prior to Admission medications   Medication Sig Start Date End Date Taking? Authorizing Provider  aspirin EC 81 MG tablet Take 81 mg by mouth daily.   Yes [provider]  atorvastatin (LIPITOR) 20 MG tablet TAKE 1 TABLET BY MOUTH EVERY DAY IN THE EVENING Patient taking differently: Take 20 mg by mouth every evening.  06/22/19  Yes Adrian Prows,  MD  cloNIDine (CATAPRES) 0.2 MG tablet TAKE 1 TABLET (0.2 MG TOTAL) BY MOUTH DAILY. 06/22/19  Yes Adrian Prows, MD  cromolyn (OPTICROM) 4 % ophthalmic solution Place 1 drop into both eyes 4 (four) times daily as needed (dry eyes).  02/02/17  Yes [provider]  empagliflozin (JARDIANCE) 10 MG TABS tablet Take 10 mg by mouth daily before breakfast. 07/25/19  Yes Marrian Salvage, FNP  furosemide (LASIX) 20 MG tablet TAKE 1 TABLET BY MOUTH EVERY DAY Patient taking differently: Take 20 mg by mouth daily.  08/01/19  Yes Adrian Prows, MD  isosorbide mononitrate (IMDUR) 60 MG 24 hr tablet Take 60 mg by mouth daily.   Yes [provider]  metFORMIN (GLUCOPHAGE) 1000 MG tablet TAKE 1 TABLET TWICE A DAY WITH A MEAL Patient taking differently: Take 1,000 mg by mouth 2 (two) times daily with a meal.  07/31/19  Yes Hoyt Koch, MD  metoprolol succinate (TOPROL-XL) 100 MG 24 hr tablet Take 1 tablet (100 mg total) by mouth daily. Take with or  immediately following a meal. 07/24/19  Yes Adrian Prows, MD  nitroGLYCERIN (NITROSTAT) 0.4 MG SL tablet Place 1 tablet (0.4 mg total) under the tongue every 5 (five) minutes as needed for chest pain. 06/01/19  Yes Adrian Prows, MD  olmesartan-hydrochlorothiazide (BENICAR HCT) 40-25 MG tablet TAKE 1 TABLET BY MOUTH EVERY DAY Patient taking differently: Take 1 tablet by mouth daily.  07/03/19  Yes Adrian Prows, MD  PREVIDENT 5000 BOOSTER PLUS 1.1 % PSTE Take 1 application by mouth daily.  06/14/18  Yes [provider]  silodosin (RAPAFLO) 8 MG CAPS capsule Take 8 mg by mouth at bedtime. 07/27/19  Yes [provider]  spironolactone (ALDACTONE) 25 MG tablet Take 1 tablet (25 mg total) by mouth daily. 06/01/19 08/30/19 Yes Adrian Prows, MD  sucralfate (CARAFATE) 1 GM/10ML suspension Take 10 mLs (1 g total) by mouth 4 (four) times daily -  with meals and at bedtime. 07/24/19  Yes Marrian Salvage, FNP  tamsulosin [facility]) 0.4 MG CAPS capsule Take 0.4  mg by mouth daily after breakfast.  05/20/18  Yes [provider]  Trospium Chloride 60 MG CP24 Take 1 capsule by mouth daily. 07/31/19  Yes [provider]  verapamil (VERELAN PM) 240 MG 24 hr capsule TAKE 1 CAPSULE BY MOUTH EVERY DAY Patient taking differently: Take 240 mg by mouth daily.  07/24/19  Yes Adrian Prows, MD     Interim history/subjective:  Hypotension this am following Isordil administration.  CVP 6.  Episodes of emesis.  Objective   Blood pressure (!) 91/44, pulse 95, temperature 98.2 F (36.8 C), resp. rate (!) 26, height 5\' 9"  (1.753 m), weight 96.3 kg, SpO2 100 %. CVP:  [4 mmHg-29 mmHg] 4 mmHg  Vent Mode: PRVC FiO2 (%):  [40 %] 40 % Set Rate:  [18 bmp] 18 bmp Vt Set:  [560 mL] 560 mL PEEP:  [5 cmH20] 5 cmH20 Plateau Pressure:  [20 cmH20-22 cmH20] 22 cmH20   Intake/Output Summary (Last 24 hours) at 08/28/2019 1058 Last data filed at 08/28/2019 1000 Gross per 24 hour  Intake 1487.53 ml  Output 5430 ml  Net -3942.47 ml   Filed Weights   08/26/19 0420 08/27/19 0615 08/28/19 0600  Weight: 105.2 kg 101.5 kg 96.3 kg    Examination: Physical Exam Constitutional:      Appearance: He is obese.     Interventions: He is intubated.     Comments: On ventilator, off sedation.   HENT:     Nose:     Comments: Small bore feeding tube in place.     Mouth/Throat:     Comments: ETT tube in place with no skin breakdown Eyes:     General: No scleral icterus.    Extraocular Movements: Extraocular movements intact.     Conjunctiva/sclera: Conjunctivae normal.  Cardiovascular:     Rate and Rhythm: Normal rate and regular rhythm.     Arteriovenous access: right arteriovenous access is present.    Comments: Distant heart sounds.  Right IJ introducer and [place] HD catheter. Pulmonary:     Effort: Pulmonary effort is normal. He is intubated.     Comments: Tolerating PSV Chest:     Comments: Mild VAC dressing in place Abdominal:     General: Bowel sounds are absent.  There is distension.     Palpations: Abdomen is soft.  Genitourinary:    Comments: Condom catheter Musculoskeletal:     Right lower leg: 2+ Pitting Edema present.     Left lower leg:  2+ Pitting Edema present.  Neurological:     Mental Status: He is alert.     GCS: GCS eye subscore is 4. GCS verbal subscore is 5. GCS motor subscore is 6.     Motor: Weakness present. No tremor.     Comments: Able to stick tongue out to command but not able to move limbs      Ancillary tests (personally reviewed)  CBC: Recent Labs  Lab 08/24/19 0405 08/25/19 0326 08/26/19 0335 08/27/19 0328 08/28/19 0849  WBC 12.2* 13.5* 13.1* 16.9* 17.4*  HGB 8.6* 9.3* 9.0* 8.6* 8.8*  HCT 27.2* 29.2* 28.3* 27.4* 28.5*  MCV 94.1 94.5 95.6 95.1 96.0  PLT 171 215 211 236 841    Basic Metabolic Panel: Recent Labs  Lab 08/24/19 0405 08/24/19 0405 08/25/19 0326 08/25/19 1531 08/26/19 0339 08/26/19 1546 08/27/19 0328 08/27/19 1611 08/28/19 0424  NA 140   < > 138   < > 137 138 135 137 137  K 4.2   < > 4.5   < > 4.5 5.2* 5.1 5.2* 4.7  CL 102   < > 102   < > 103 104 102 101 101  CO2 22   < > 23   < > 23 23 23 23 23   GLUCOSE 204*   < > 171*   < > 196* 218* 206* 134* 112*  BUN 114*   < > 93*   < > 64* 57* 58* 46* 44*  CREATININE 5.27*   < > 3.76*   < > 2.52* 2.31* 2.40* 2.31* 2.43*  CALCIUM 8.2*   < > 8.4*   < > 8.3* 8.7* 8.6* 9.0 8.8*  MG 2.3  --  2.4  --  2.5*  --  2.6*  --  2.9*  PHOS  --   --  6.2*   < > 4.2 4.2 3.9 4.0 3.9   < > = values in this interval not displayed.   GFR: Estimated Creatinine Clearance: 31.4 mL/min (A) (by C-G formula based on SCr of 2.43 mg/dL (H)). Recent Labs  Lab 08/25/19 0326 08/26/19 0335 08/27/19 0328 08/28/19 0849  WBC 13.5* 13.1* 16.9* 17.4*    Liver Function Tests: Recent Labs  Lab 08/22/19 0444 08/22/19 0444 08/23/19 0552 08/23/19 0552 08/24/19 0405 08/25/19 0326 08/26/19 0339 08/26/19 1546 08/27/19 0328 08/27/19 1611 08/28/19 0424  AST 85*  --   50*  --  59*  --   --   --   --   --   --   ALT 495*  --  220*  --  169*  --   --   --   --   --   --   ALKPHOS 86  --  82  --  115  --   --   --   --   --   --   BILITOT 0.9  --  0.8  --  0.7  --   --   --   --   --   --   PROT 5.0*  --  4.2*  --  4.3*  --   --   --   --   --   --   ALBUMIN 2.0*   < > 1.5*   < > 1.6*   < > 1.8* 1.9* 2.0* 2.2* 2.1*   < > = values in this interval not displayed.   No results for input(s): LIPASE, AMYLASE in the last 168 hours. No results for input(s): AMMONIA  in the last 168 hours.  ABG    Component Value Date/Time   PHART 7.513 (H) 08/20/2019 0404   PCO2ART 34.7 08/20/2019 0404   PO2ART 216 (H) 08/20/2019 0404   HCO3 27.9 08/20/2019 0404   TCO2 29 08/20/2019 0404   ACIDBASEDEF 5.0 (H) 08/18/2019 2341   O2SAT 67.3 08/28/2019 0432     Coagulation Profile: No results for input(s): INR, PROTIME in the last 168 hours.  Cardiac Enzymes: No results for input(s): CKTOTAL, CKMB, CKMBINDEX, TROPONINI in the last 168 hours.  HbA1C: Hgb A1c MFr Bld  Date/Time Value Ref Range Status  08/14/2019 07:45 PM 7.7 (H) 4.8 - 5.6 % Final    Comment:    (NOTE) Pre diabetes:          5.7%-6.4% Diabetes:              >6.4% Glycemic control for   <7.0% adults with diabetes   07/24/2019 03:12 PM 7.7 (H) 4.6 - 6.5 % Final    Comment:    Glycemic Control Guidelines for People with Diabetes:Non Diabetic:  <6%Goal of Therapy: <7%Additional Action Suggested:  >8%     CBG: Recent Labs  Lab 08/27/19 1550 08/27/19 2030 08/27/19 2332 08/28/19 0433 08/28/19 0806  GLUCAP 124* 104* 126* 106* 110*   CXR 08/27/19:  Small left effusion, retrocardiac consolidation. (personal interpretation)  Echo 4/24: EF 45% with Grade 2 DD.   Assessment & Plan:   Critically ill due to prolonged acute hypoxic hypercapnic respiratory failure requiring mechanical ventilation following cardiac surgery. While able to tolerate brief SBT, doubt will be able to protect airway if  extubated. - Recommend tracheostomy placement at time of next OR for chest closure. - Minimize sedation and begin SBT  Acute decompensated systolic and diastolic heart failure. Now off all vasoactive infusions. - Continue ISDN and HDLZ for HF management.   Critically ill due to acute kidney injury requiring CRRT to control volume overload and electrolyte imbalances. - Continue to UF to remove edema with primary goal to permit definitive chest closure.   Status post CABG with multiple chest explorations and VAC dressing still in place - For eventual closure this week.  Generalized frailty.  Weakness of critical illness. [place] Suspect much of the fluid is now extravascular due to cachexia and immobility - Limit sedatives to promote interactivity and help regain function.   Daily Goals Checklist  Pain/Anxiety/Delirium protocol (if indicated): intermittent dilaudid only.  VAP protocol (if indicated): bundle in place.  Respiratory support goals: daily SBT Blood pressure target: Keep MAP >65 DVT prophylaxis: on IV heparin Nutritional status and feeding goals: high nutritional risk. Move tube into small bowel. Assess nutritional adequacy, target 2g/kg/d protein intake. GI prophylaxis: Pantoprazole Fluid status goals: continue to UF at -100 Urinary catheter: condom  Central lines: right IJ and [place], arterial line Glucose control: currently euglycemic Mobility/therapy needs: will need aggressive therapy. Antibiotic de-escalation: prophylaxis for open chest.  Home medication reconciliation: on hold Daily labs: CBC, BMP Code Status: full  Family Communication: per cardiac surgery. Disposition: ICU   CRITICAL CARE Performed by: Kipp Brood   Total critical care time: 45 minutes  Critical care time was exclusive of separately billable procedures and treating other patients.  Critical care was necessary to treat or prevent imminent or life-threatening deterioration.  Critical  care was time spent personally by me on the following activities: development of treatment plan with patient and/or surrogate as well as nursing, discussions with consultants, evaluation of patient's response to  treatment, examination of patient, obtaining history from patient or surrogate, ordering and performing treatments and interventions, ordering and review of laboratory studies, ordering and review of radiographic studies, pulse oximetry, re-evaluation of patient's condition and participation in multidisciplinary rounds.  Kipp Brood, MD [facility] ICU Physician [place]  Pager: [phone] Mobile: [phone] After hours: [phone].    08/28/2019, 10:58 AM

## 2019-08-28 NOTE — Progress Notes (Signed)
Patient ID: Christian [name], male   DOB: 07-09-1945, 74 y.o.   MRN: [id number]     Advanced Heart Failure Rounding Note  PCP-Cardiologist: No primary care provider on file.   Subjective:    08/09/19 Admitted with NSTEM and ? Small RLL PE. Weight 213 pounds.  08/15/19 CABG x4 on 08/15/19 08/18/19 Cardiac arrest due to tamponade. Chest opened for washout 4/26 Chest washout in OR 4/29 CVVH started  Remains intubated and sedated. FiO2 40%.    He will wake up and can follow commands.  CT head negative on 5/1.   He is off pressors. Co-ox 67%.   Has AKI. Suspect ATN from cardiogenic shock/pericardial tamponade. CVVH initiated 4/29 to assist w/ volume removal. I/O net 4156 negative with weight down to his pre-op baseline.  CVP 8-9.  UF ongoing 100-150 cc/hr net.   On vanc/cefepime in setting of open chest. Afebrile.   Abdomen remains distended (ileus) on OGT suction but had BM.   Echo 4/25: EF 40-45% with some residual pericardial clot.  Objective:   Weight Range: 96.3 kg Body mass index is 31.35 kg/m.   Vital Signs:   Temp:  [96.6 F (35.9 C)-99.7 F (37.6 C)] 99 F (37.2 C) (05/03 0700) Pulse Rate:  [56-108] 101 (05/03 0700) Resp:  [18-35] 22 (05/03 0700) BP: (114-147)/(55-60) 114/56 (05/03 0334) SpO2:  [99 %-100 %] 100 % (05/03 0700) Arterial Line BP: (67-285)/(42-279) 121/53 (05/03 0700) FiO2 (%):  [40 %] 40 % (05/03 0334) Weight:  [96.3 kg] 96.3 kg (05/03 0600) Last BM Date: 08/27/19  Weight change: Filed Weights   08/26/19 0420 08/27/19 0615 08/28/19 0600  Weight: 105.2 kg 101.5 kg 96.3 kg    Intake/Output:   Intake/Output Summary (Last 24 hours) at 08/28/2019 0802 Last data filed at 08/28/2019 0700 Gross per 24 hour  Intake 1470.76 ml  Output 5376 ml  Net -3905.24 ml      Physical Exam   General: NAD, awake on vent Neck: No JVD, no thyromegaly or thyroid nodule.  Lungs: Clear to auscultation bilaterally with normal respiratory effort. CV: Chest remains  open.  Heart regular S1/S2, no S3/S4, no murmur.  No peripheral edema.  Abdomen: Soft, nontender, no hepatosplenomegaly, no distention.  Skin: Intact without lesions or rashes.  Neurologic: Will follow some commands Extremities: No clubbing or cyanosis.  HEENT: Normal.    Telemetry   NSR 90s-100s  (personally reviewed)  EKG    N/a   Labs    CBC Recent Labs    08/26/19 0335 08/27/19 0328  WBC 13.1* 16.9*  HGB 9.0* 8.6*  HCT 28.3* 27.4*  MCV 95.6 95.1  PLT 211 852   Basic Metabolic Panel Recent Labs    08/27/19 0328 08/27/19 0328 08/27/19 1611 08/28/19 0424  NA 135   < > 137 137  K 5.1   < > 5.2* 4.7  CL 102   < > 101 101  CO2 23   < > 23 23  GLUCOSE 206*   < > 134* 112*  BUN 58*   < > 46* 44*  CREATININE 2.40*   < > 2.31* 2.43*  CALCIUM 8.6*   < > 9.0 8.8*  MG 2.6*  --   --  2.9*  PHOS 3.9   < > 4.0 3.9   < > = values in this interval not displayed.   Liver Function Tests Recent Labs    08/27/19 1611 08/28/19 0424  ALBUMIN 2.2* 2.1*   No results for input(s):  LIPASE, AMYLASE in the last 72 hours. Cardiac Enzymes No results for input(s): CKTOTAL, CKMB, CKMBINDEX, TROPONINI in the last 72 hours.  BNP: BNP (last 3 results) Recent Labs    08/09/19 1240  BNP 89.1    ProBNP (last 3 results) Recent Labs    11/23/18 1120 08/09/19 0922  PROBNP 18.0 78.0     D-Dimer No results for input(s): DDIMER in the last 72 hours. Hemoglobin A1C No results for input(s): HGBA1C in the last 72 hours. Fasting Lipid Panel Recent Labs    08/27/19 1611  TRIG 71   Thyroid Function Tests No results for input(s): TSH, T4TOTAL, T3FREE, THYROIDAB in the last 72 hours.  Invalid input(s): FREET3  Other results:   Imaging    DG Abd 1 View  Result Date: 08/27/2019 CLINICAL DATA:  Possible ileus. EXAM: ABDOMEN - 1 VIEW COMPARISON:  08/18/2019 FINDINGS: Two enteric tubes are present with both tips just right of midline likely over the distal stomach. Bowel  gas pattern is nonobstructive. Air is present throughout the colon. There are a few air-filled nondilated small bowel loops in the right lower quadrant. No free peritoneal air. Remainder the exam is unchanged. IMPRESSION: 1. Nonspecific, nonobstructive bowel gas pattern with a few air-filled nondilated small bowel loops over the right lower quadrant. No free air. 2.  Tubes and lines as described. Electronically Signed   By: Marin Olp M.D.   On: 08/27/2019 09:59   DG CHEST PORT 1 VIEW  Result Date: 08/27/2019 CLINICAL DATA:  Possible ileus. EXAM: PORTABLE CHEST 1 VIEW COMPARISON:  08/26/2019 FINDINGS: Endotracheal tube has tip 3 cm above the carina. Two enteric tubes course into the stomach and off the film as tip not visualized. Right subclavian central venous catheter and right IJ central venous sheath unchanged. Bilateral chest tubes unchanged. Lungs are adequately inflated with mild hazy left base opacification unchanged and possibly due to atelectasis and less likely infection. Improved hazy prominence of the right infrahilar vessels. No pneumothorax. Stable cardiomegaly. Remainder of the exam is unchanged. IMPRESSION: 1. Stable left base opacification likely atelectasis and less likely infection. Stable cardiomegaly. 2.  Tubes and lines as described. Electronically Signed   By: Marin Olp M.D.   On: 08/27/2019 09:58     Medications:     Scheduled Medications: . sodium chloride   Intravenous Once  . sodium chloride   Intravenous Once  . aspirin  81 mg Per Tube Daily  . atorvastatin  40 mg Per Tube Daily  . B-complex with vitamin C  1 tablet Per Tube Daily  . bisacodyl  10 mg Oral Daily   Or  . bisacodyl  10 mg Rectal Daily  . chlorhexidine gluconate (MEDLINE KIT)  15 mL Mouth Rinse BID  . Chlorhexidine Gluconate Cloth  6 each Topical Daily  . clopidogrel  75 mg Per Tube Daily  . docusate  100 mg Per Tube BID  . feeding supplement (PRO-STAT SUGAR FREE 64)  30 mL Per Tube QID  .  fentaNYL (SUBLIMAZE) injection  25 mcg Intravenous Once  . insulin aspart  0-24 Units Subcutaneous Q4H  . isosorbide dinitrate  20 mg Per Tube TID  . lactulose  30 g Oral Daily  . mouth rinse  15 mL Mouth Rinse 10 times per day  . metoCLOPramide (REGLAN) injection  5 mg Intravenous Q8H  . pantoprazole sodium  40 mg Per Tube Daily  . sodium chloride flush  10-40 mL Intracatheter Q12H  . sodium chloride flush  3 mL Intravenous Q12H  . tamsulosin  0.4 mg Oral QPC breakfast  . trospium  20 mg Per Tube BID    Infusions: .  prismasol BGK 4/2.5 500 mL/hr at 08/27/19 2142  . amiodarone 30 mg/hr (08/28/19 0700)  . ceFEPime (MAXIPIME) IV Stopped (08/27/19 2358)  . epinephrine Stopped (08/21/19 1531)  . feeding supplement (VITAL AF 1.2 CAL) Stopped (08/27/19 0800)  . fentaNYL infusion INTRAVENOUS Stopped (08/28/19 0503)  . heparin 850 Units/hr (08/28/19 0700)  . lactated ringers    . lactated ringers    . lactated ringers    . lactated ringers 20 mL/hr at 08/20/19 1200  . magnesium sulfate bolus IVPB    . midazolam Stopped (08/28/19 0419)  . prismasol BGK 0/2.5 1 each (08/27/19 1859)  . prismasol BGK 4/2.5 1,500 mL/hr at 08/28/19 0430  . propofol (DIPRIVAN) infusion Stopped (08/26/19 1120)  . vancomycin Stopped (08/28/19 0242)    PRN Medications: cromolyn, dextrose, fentaNYL, heparin, heparin, hydrALAZINE, lactated ringers, midazolam, morphine injection, ondansetron (ZOFRAN) IV, oxyCODONE, sodium chloride flush, sodium chloride flush, traMADol     Assessment/Plan   1. CAD: s/p PDA angioplasty + stent in 02/2014.  Admitted for NSTEMI 4/14. Hs Trop peaked at 3,309.  LHC w/ severe 3V CAD. Echo w/ reduced EF 30-35%. RV ok.  S/p CABG x 4 on 4/20 (LIMA-LAD, RIMA to PDA, Lt radial artery graft to 1st OM and distal OM branches of LCx).  Chest reopened urgently at the bedside for cardiac tamponade and removal of clot on 4/23  - continue ASA/Plavix.  Will need to stop one of the antiplatelet  agents ultimately as he will be on anticoagulation at home.  - Radial harvest. On isordil 20 tid.  - Atorvastatin 40 daily.   - Chest closure timing per Dr. Orvan Seen, think adequate volume off so could likely go today.  2. Acute Systolic Heart Failure: Ischemic Cardiomyopathy -> cardiogenic shock.  LVEF severely reduced at 30-35%, in the setting of NSTEM/ Multivessel CAD, RV ok initially.  Underwent urgent sternotomy at the bedside for cardiac tamponade and removal of clot on 4/23. Chest remains open. Luiz Blare numbers have been concerning for RV failure (swan now out).  Echo 4/24 EF 40 to 45% with inotropic support, small amount of residual pericardial clot, RV moderately hypokinetic.  Off pressors/inotropes, co-ox 67% today. CVP 8-9, weight now down to his pre-op baseline. - Can decrease UF by CVVH to net even to -50 cc/hr range.    - Remains on isordil, with BP lower would stop hydralazine.    3.  Postoperative acute hypoxic respiratory failure: Reintubated on 4/23 due to cardiogenic shock and return to OR. - Keep intubated/sedated while chest open.  4. AKI: Baseline SCr 0.9. Creatinine peaked to 5.3, suspect ATN in setting of cardiogenic shock/tamponade.  Now on CVVH for volume removal, no urine yesterday.  - As above, can cut back net UF today.  5.  Shock liver: Due to cardiac arrest on 4/23.  LFTs trending down.  6. 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 Maintaining NSR.   - continue PO amio 200 mg twice a day.  - Low dose heparin gtt currently while chest open.   7.  Possible small right lower lobe PE: Chest CT 4/14 w/ small RLL PE.  Bilateral Venous Dopplers negative for DVT.  - Low dose heparin has been started.   - Anticipate switching eliquis prior to d/c.  8. T2DM: Hgb A1c 7.7 -Remains on insulin 9. Pericardial tamponade: 4/23  return to OR for clot removal, chest wall left open. Patient had been on heparin gtt for DCCV of atrial flutter.  - Timing of chest closure per surgery, think  enough volume off to go to OR today.  10. Acute hypoxemic respiratory failure: Intubated.  Suspect component of pulmonary edema but had emesis 5/2  with fecal material and concerned he may have aspirated.  11. Ileus: Had BM yesterday, on Reglan.  - OGT suction. - Continue Reglan 5 mg IV every 8 hrs.  12. Neuro: Following commands intermittently.  CT head negative.   Length of Stay: [place] Performed by: Loralie Champagne  Total critical care time: 40 minutes  Critical care time was exclusive of separately billable procedures and treating other patients.  Critical care was necessary to treat or prevent imminent or life-threatening deterioration.  Critical care was time spent personally by me on the following activities: development of treatment plan with patient and/or surrogate as well as nursing, discussions with consultants, evaluation of patient's response to treatment, examination of patient, obtaining history from patient or surrogate, ordering and performing treatments and interventions, ordering and review of laboratory studies, ordering and review of radiographic studies, pulse oximetry and re-evaluation of patient's condition.   Loralie Champagne 08/28/2019 8:02 AM

## 2019-08-28 NOTE — Progress Notes (Signed)
ETT holder hanged at this time and bite block changed.  Pt tolerated well.  RT will continue to monitor.

## 2019-08-28 NOTE — Progress Notes (Signed)
CT surgery  Responsive on vent Pressure support vent weaning 4 hrs today NSR on iv amio CVVH removal 50-100/htr

## 2019-08-28 NOTE — Progress Notes (Signed)
K+ 5.2 with evening labs.  Dr. Arty Baumgartner called and given results.  BMET ordered for 2000 tonight to recheck and follow-up on it.

## 2019-08-28 NOTE — Progress Notes (Signed)
ANTICOAGULATION CONSULT NOTE - Follow Up Consult  Pharmacy Consult for heparin Indication: pulmonary embolus  Labs: Recent Labs    08/26/19 0335 08/26/19 0339 08/27/19 0328 08/27/19 0328 08/27/19 1611 08/27/19 2335 08/28/19 0424 08/28/19 0849  HGB 9.0*   < > 8.6*  --   --   --   --  8.8*  HCT 28.3*  --  27.4*  --   --   --   --  28.5*  PLT 211  --  236  --   --   --   --  249  HEPARINUNFRC  --    < > 0.14*   < > 0.23* 0.17*  --  0.23*  CREATININE  --    < > 2.40*  --  2.31*  --  2.43*  --    < > = values in this interval not displayed.    Assessment: 74yo male subtherapeutic on heparin. Patient has been therapeutic on lower goal; no gtt issues or signs of bleeding per RN.  Hemoglobin stable 8.8. Plan for trach and chest closure later this week.   Goal of Therapy:  Heparin level 0.2-0.3 units/ml   Plan:  Heparin continues at 850 units/hr -lower goal due to open chest Daily heparin level for now  Erin Hearing PharmD., BCPS Clinical Pharmacist 08/28/2019 1:34 PM

## 2019-08-28 NOTE — Progress Notes (Signed)
4 Days Post-Op Procedure(s) (LRB): CHEST WASHOUT POST OPERATIVE OPEN HEART (N/A) APPLICATION OF WOUND VAC (N/A) Subjective: sedated  Objective: Vital signs in last 24 hours: Temp:  [96.6 F (35.9 C)-99.7 F (37.6 C)] 98.8 F (37.1 C) (05/03 0800) Pulse Rate:  [56-108] 75 (05/03 0800) Cardiac Rhythm: Normal sinus rhythm (05/03 0800) Resp:  [18-35] 25 (05/03 0800) BP: (95-147)/(41-60) 95/41 (05/03 0816) SpO2:  [99 %-100 %] 100 % (05/03 0816) Arterial Line BP: (67-285)/(42-279) 111/42 (05/03 0800) FiO2 (%):  [40 %] 40 % (05/03 0816) Weight:  [96.3 kg] 96.3 kg (05/03 0600)  Hemodynamic parameters for last 24 hours: CVP:  [7 mmHg-29 mmHg] 7 mmHg  Intake/Output from previous day: 05/02 0701 - 05/03 0700 In: 1503.4 [I.V.:923.3; NG/GT:130; IV Piggyback:450.1] Out: 5659 [Emesis/NG output:600; Drains:50; Chest Tube:60] Intake/Output this shift: Total I/O In: 27 [I.V.:27] Out: 376 [Other:376]  General appearance: slowed mentation Neurologic: unable to fully assess Heart: regular rate and rhythm, S1, S2 normal, no murmur, click, rub or gallop Lungs: clear to auscultation bilaterally Abdomen: abnormal findings:  distended Extremities: edema 1+ Wound: wound vac intact  Lab Results: Recent Labs    08/27/19 0328 08/28/19 0849  WBC 16.9* 17.4*  HGB 8.6* 8.8*  HCT 27.4* 28.5*  PLT 236 249   BMET:  Recent Labs    08/27/19 1611 08/28/19 0424  NA 137 137  K 5.2* 4.7  CL 101 101  CO2 23 23  GLUCOSE 134* 112*  BUN 46* 44*  CREATININE 2.31* 2.43*  CALCIUM 9.0 8.8*    PT/INR: No results for input(s): LABPROT, INR in the last 72 hours. ABG    Component Value Date/Time   PHART 7.513 (H) 08/20/2019 0404   HCO3 27.9 08/20/2019 0404   TCO2 29 08/20/2019 0404   ACIDBASEDEF 5.0 (H) 08/18/2019 2341   O2SAT 67.3 08/28/2019 0432   CBG (last 3)  Recent Labs    08/27/19 2332 08/28/19 0433 08/28/19 0806  GLUCAP 126* 106* 110*    Assessment/Plan: S/P Procedure(s)  (LRB): CHEST WASHOUT POST OPERATIVE OPEN HEART (N/A) APPLICATION OF WOUND VAC (N/A) vac change at bedsided today  Have asked CCM to see regarding vent wean Consider chest closure and tracheostomy later this week Appreciate cardiology, nephrology care   LOS: 19 days    Christian [name] 08/28/2019

## 2019-08-28 NOTE — Procedures (Signed)
Cortrak  Person Inserting Tube:  Jacklynn Barnacle E, RD Tube Type:  Cortrak - 43 inches Tube Location:  Left nare Initial Placement:  Postpyloric Secured by: Bridle Technique Used to Measure Tube Placement:  Documented cm marking at nare/ corner of mouth Cortrak Secured At:  95 cm    Cortrak Tube Team Note:  Consult received to place a Cortrak feeding tube.   X-ray is required, abdominal x-ray has been ordered by the Cortrak team. Please confirm tube placement before using the Cortrak tube.   If the tube becomes dislodged please keep the tube and contact the Cortrak team at [email or web address] ([email or web address]) for replacement.  If after hours and replacement cannot be delayed, place a NG tube and confirm placement with an abdominal x-ray.   Jacklynn Barnacle, MS, RD, LDN Pager number available on Amion

## 2019-08-29 ENCOUNTER — Inpatient Hospital Stay (HOSPITAL_COMMUNITY): Payer: Medicare Other | Admitting: Certified Registered"

## 2019-08-29 ENCOUNTER — Inpatient Hospital Stay (HOSPITAL_COMMUNITY): Payer: Medicare Other

## 2019-08-29 ENCOUNTER — Encounter (HOSPITAL_COMMUNITY)
Admission: EM | Disposition: A | Discharge status: Skilled Nursing Facility | Payer: Self-pay | Source: Home / Self Care | Attending: Cardiothoracic Surgery

## 2019-08-29 ENCOUNTER — Ambulatory Visit: Payer: Federal, State, Local not specified - PPO | Admitting: Gastroenterology

## 2019-08-29 DIAGNOSIS — I5023 Acute on chronic systolic (congestive) heart failure: Secondary | ICD-10-CM | POA: Diagnosis not present

## 2019-08-29 DIAGNOSIS — Z9911 Dependence on respirator [ventilator] status: Secondary | ICD-10-CM

## 2019-08-29 DIAGNOSIS — R570 Cardiogenic shock: Secondary | ICD-10-CM | POA: Diagnosis not present

## 2019-08-29 DIAGNOSIS — I9789 Other postprocedural complications and disorders of the circulatory system, not elsewhere classified: Secondary | ICD-10-CM

## 2019-08-29 DIAGNOSIS — N179 Acute kidney failure, unspecified: Secondary | ICD-10-CM | POA: Diagnosis not present

## 2019-08-29 DIAGNOSIS — I214 Non-ST elevation (NSTEMI) myocardial infarction: Secondary | ICD-10-CM | POA: Diagnosis not present

## 2019-08-29 HISTORY — PX: APPLICATION OF WOUND VAC: SHX5189

## 2019-08-29 HISTORY — PX: EXPLORATION POST OPERATIVE OPEN HEART: SHX5061

## 2019-08-29 HISTORY — PX: TRACHEOSTOMY TUBE PLACEMENT: SHX814

## 2019-08-29 LAB — HEPARIN LEVEL (UNFRACTIONATED): Heparin Unfractionated: 0.19 IU/mL — ABNORMAL LOW (ref 0.30–0.70)

## 2019-08-29 LAB — CULTURE, RESPIRATORY W GRAM STAIN: Culture: NORMAL

## 2019-08-29 LAB — RENAL FUNCTION PANEL
Albumin: 2.3 g/dL — ABNORMAL LOW (ref 3.5–5.0)
Albumin: 2.2 g/dL — ABNORMAL LOW (ref 3.5–5.0)
Anion gap: 13 (ref 5–15)
Anion gap: 12 (ref 5–15)
BUN: 48 mg/dL — ABNORMAL HIGH (ref 8–23)
BUN: 45 mg/dL — ABNORMAL HIGH (ref 8–23)
CO2: 23 mmol/L (ref 22–32)
CO2: 21 mmol/L — ABNORMAL LOW (ref 22–32)
Calcium: 8.7 mg/dL — ABNORMAL LOW (ref 8.9–10.3)
Calcium: 8.3 mg/dL — ABNORMAL LOW (ref 8.9–10.3)
Chloride: 100 mmol/L (ref 98–111)
Chloride: 105 mmol/L (ref 98–111)
Creatinine, Ser: 2.46 mg/dL — ABNORMAL HIGH (ref 0.61–1.24)
Creatinine, Ser: 2.68 mg/dL — ABNORMAL HIGH (ref 0.61–1.24)
GFR calc Af Amer: 29 mL/min — ABNORMAL LOW (ref >60)
GFR calc Af Amer: 26 mL/min — ABNORMAL LOW (ref >60)
GFR calc non Af Amer: 25 mL/min — ABNORMAL LOW (ref >60)
GFR calc non Af Amer: 23 mL/min — ABNORMAL LOW (ref >60)
Glucose, Bld: 158 mg/dL — ABNORMAL HIGH (ref 70–99)
Glucose, Bld: 131 mg/dL — ABNORMAL HIGH (ref 70–99)
Phosphorus: 3.4 mg/dL (ref 2.5–4.6)
Phosphorus: 4.1 mg/dL (ref 2.5–4.6)
Potassium: 4 mmol/L (ref 3.5–5.1)
Potassium: 4 mmol/L (ref 3.5–5.1)
Sodium: 136 mmol/L (ref 135–145)
Sodium: 138 mmol/L (ref 135–145)

## 2019-08-29 LAB — CBC WITH DIFFERENTIAL/PLATELET
Abs Immature Granulocytes: 0.94 10*3/uL — ABNORMAL HIGH (ref 0.00–0.07)
Basophils Absolute: 0.1 10*3/uL (ref 0.0–0.1)
Basophils Relative: 1 %
Eosinophils Absolute: 0.1 10*3/uL (ref 0.0–0.5)
Eosinophils Relative: 1 %
HCT: 24.8 % — ABNORMAL LOW (ref 39.0–52.0)
Hemoglobin: 7.7 g/dL — ABNORMAL LOW (ref 13.0–17.0)
Immature Granulocytes: 5 %
Lymphocytes Relative: 6 %
Lymphs Abs: 1.1 10*3/uL (ref 0.7–4.0)
MCH: 29.8 pg (ref 26.0–34.0)
MCHC: 31 g/dL (ref 30.0–36.0)
MCV: 96.1 fL (ref 80.0–100.0)
Monocytes Absolute: 1.5 10*3/uL — ABNORMAL HIGH (ref 0.1–1.0)
Monocytes Relative: 8 %
Neutro Abs: 14.9 10*3/uL — ABNORMAL HIGH (ref 1.7–7.7)
Neutrophils Relative %: 79 %
Platelets: 224 10*3/uL (ref 150–400)
RBC: 2.58 MIL/uL — ABNORMAL LOW (ref 4.22–5.81)
RDW: 17.5 % — ABNORMAL HIGH (ref 11.5–15.5)
WBC Morphology: INCREASED
WBC: 18 10*3/uL — ABNORMAL HIGH (ref 4.0–10.5)
nRBC: 0.2 % (ref 0.0–0.2)

## 2019-08-29 LAB — COOXEMETRY PANEL
Carboxyhemoglobin: 0.9 % (ref 0.5–1.5)
Methemoglobin: 0.8 % (ref 0.0–1.5)
O2 Saturation: 65.8 %
Total hemoglobin: 8.6 g/dL — ABNORMAL LOW (ref 12.0–16.0)

## 2019-08-29 LAB — MAGNESIUM: Magnesium: 2.8 mg/dL — ABNORMAL HIGH (ref 1.7–2.4)

## 2019-08-29 LAB — CBC
HCT: 24.8 % — ABNORMAL LOW (ref 39.0–52.0)
Hemoglobin: 7.7 g/dL — ABNORMAL LOW (ref 13.0–17.0)
MCH: 29.8 pg (ref 26.0–34.0)
MCHC: 31 g/dL (ref 30.0–36.0)
MCV: 96.1 fL (ref 80.0–100.0)
Platelets: 229 10*3/uL (ref 150–400)
RBC: 2.58 MIL/uL — ABNORMAL LOW (ref 4.22–5.81)
RDW: 17.6 % — ABNORMAL HIGH (ref 11.5–15.5)
WBC: 19.1 10*3/uL — ABNORMAL HIGH (ref 4.0–10.5)
nRBC: 0.2 % (ref 0.0–0.2)

## 2019-08-29 LAB — GLUCOSE, CAPILLARY
Glucose-Capillary: 150 mg/dL — ABNORMAL HIGH (ref 70–99)
Glucose-Capillary: 158 mg/dL — ABNORMAL HIGH (ref 70–99)
Glucose-Capillary: 149 mg/dL — ABNORMAL HIGH (ref 70–99)
Glucose-Capillary: 95 mg/dL (ref 70–99)
Glucose-Capillary: 135 mg/dL — ABNORMAL HIGH (ref 70–99)

## 2019-08-29 SURGERY — EXPLORATION POST OPERATIVE OPEN HEART
Anesthesia: General | Site: Chest

## 2019-08-29 MED ORDER — ALBUMIN HUMAN 5 % IV SOLN
12.5000 g | Freq: Once | INTRAVENOUS | Status: AC
  Administered 2019-08-29: 12.5 g via INTRAVENOUS

## 2019-08-29 MED ORDER — SODIUM CHLORIDE 0.9 % IR SOLN
Status: DC | PRN
  Administered 2019-08-29: 3000 mL

## 2019-08-29 MED ORDER — FENTANYL CITRATE (PF) 250 MCG/5ML IJ SOLN
INTRAMUSCULAR | Status: AC
  Filled 2019-08-29: qty 5

## 2019-08-29 MED ORDER — PHENYLEPHRINE 40 MCG/ML (10ML) SYRINGE FOR IV PUSH (FOR BLOOD PRESSURE SUPPORT)
PREFILLED_SYRINGE | INTRAVENOUS | Status: DC | PRN
  Administered 2019-08-29 (×2): 200 ug via INTRAVENOUS

## 2019-08-29 MED ORDER — PHENYLEPHRINE HCL-NACL 10-0.9 MG/250ML-% IV SOLN
INTRAVENOUS | Status: AC
  Filled 2019-08-29: qty 250

## 2019-08-29 MED ORDER — EPHEDRINE SULFATE-NACL 50-0.9 MG/10ML-% IV SOSY
PREFILLED_SYRINGE | INTRAVENOUS | Status: DC | PRN
  Administered 2019-08-29 (×2): 15 mg via INTRAVENOUS

## 2019-08-29 MED ORDER — PROPOFOL 500 MG/50ML IV EMUL
INTRAVENOUS | Status: DC | PRN
  Administered 2019-08-29: 50 ug/kg/min via INTRAVENOUS

## 2019-08-29 MED ORDER — ROCURONIUM BROMIDE 10 MG/ML (PF) SYRINGE
PREFILLED_SYRINGE | INTRAVENOUS | Status: DC | PRN
  Administered 2019-08-29: 60 mg via INTRAVENOUS
  Administered 2019-08-29: 40 mg via INTRAVENOUS

## 2019-08-29 MED ORDER — DEXAMETHASONE SODIUM PHOSPHATE 10 MG/ML IJ SOLN
INTRAMUSCULAR | Status: DC | PRN
  Administered 2019-08-29: 4 mg via INTRAVENOUS

## 2019-08-29 MED ORDER — AMIODARONE IV BOLUS ONLY 150 MG/100ML
150.0000 mg | Freq: Once | INTRAVENOUS | Status: AC
  Administered 2019-08-29: 150 mg via INTRAVENOUS

## 2019-08-29 MED ORDER — ALBUMIN HUMAN 5 % IV SOLN
INTRAVENOUS | Status: AC
  Filled 2019-08-29: qty 250

## 2019-08-29 MED ORDER — FENTANYL CITRATE (PF) 250 MCG/5ML IJ SOLN
INTRAMUSCULAR | Status: DC | PRN
  Administered 2019-08-29 (×2): 25 ug via INTRAVENOUS

## 2019-08-29 MED ORDER — PHENYLEPHRINE HCL-NACL 10-0.9 MG/250ML-% IV SOLN: 0.0000 ug/min | INTRAVENOUS | Status: DC

## 2019-08-29 MED ORDER — SODIUM CHLORIDE 0.9 % IV SOLN
250.0000 mL | INTRAVENOUS | Status: DC
  Administered 2019-08-31 – 2019-09-13 (×3): 250 mL via INTRAVENOUS

## 2019-08-29 MED ORDER — SODIUM CHLORIDE 0.9 % IV SOLN: INTRAVENOUS | Status: DC | PRN

## 2019-08-29 MED ORDER — ONDANSETRON HCL 4 MG/2ML IJ SOLN
INTRAMUSCULAR | Status: DC | PRN
  Administered 2019-08-29: 4 mg via INTRAVENOUS

## 2019-08-29 MED ORDER — MIDAZOLAM HCL 2 MG/2ML IJ SOLN
INTRAMUSCULAR | Status: AC
  Filled 2019-08-29: qty 2

## 2019-08-29 MED ORDER — PHENYLEPHRINE HCL-NACL 10-0.9 MG/250ML-% IV SOLN
INTRAVENOUS | Status: DC | PRN
  Administered 2019-08-29: 60 ug/min via INTRAVENOUS

## 2019-08-29 MED ORDER — MIDAZOLAM HCL 2 MG/2ML IJ SOLN
INTRAMUSCULAR | Status: DC | PRN
  Administered 2019-08-29: 2 mg via INTRAVENOUS

## 2019-08-29 MED ORDER — VASOPRESSIN 20 UNIT/ML IV SOLN
INTRAVENOUS | Status: DC | PRN
Start: 2019-08-29 — End: 2019-08-29
  Administered 2019-08-29: 1 [IU] via INTRAVENOUS

## 2019-08-29 MED ORDER — VASOPRESSIN 20 UNIT/ML IV SOLN
INTRAVENOUS | Status: AC
  Filled 2019-08-29: qty 1

## 2019-08-29 MED ORDER — PHENYLEPHRINE CONCENTRATED 100MG/250ML (0.4 MG/ML) INFUSION SIMPLE
0.0000 ug/min | INTRAVENOUS | Status: DC
  Administered 2019-08-29: 20 ug/min via INTRAVENOUS
  Administered 2019-08-29: 40 ug/min via INTRAVENOUS
  Administered 2019-08-30: 20 ug/min via INTRAVENOUS
  Filled 2019-08-29 (×5): qty 250

## 2019-08-29 SURGICAL SUPPLY — 72 items
BAG DECANTER FOR FLEXI CONT (MISCELLANEOUS) IMPLANT
BENZOIN TINCTURE PRP APPL 2/3 (GAUZE/BANDAGES/DRESSINGS) ×6 IMPLANT
BLADE SURG 15 STRL LF DISP TIS (BLADE) ×2 IMPLANT
BLADE SURG 15 STRL SS (BLADE) ×3
BNDG ELASTIC 4X5.8 VLCR STR LF (GAUZE/BANDAGES/DRESSINGS) IMPLANT
BNDG ELASTIC 6X5.8 VLCR STR LF (GAUZE/BANDAGES/DRESSINGS) IMPLANT
CANISTER SUCT 3000ML PPV (MISCELLANEOUS) IMPLANT
CATH CPB KIT HENDRICKSON (MISCELLANEOUS) IMPLANT
CATH ROBINSON RED A/P 18FR (CATHETERS) IMPLANT
DERMABOND ADVANCED (GAUZE/BANDAGES/DRESSINGS)
DERMABOND ADVANCED .7 DNX12 (GAUZE/BANDAGES/DRESSINGS) IMPLANT
DRAIN CHANNEL 28F RND 3/8 FF (WOUND CARE) ×6 IMPLANT
DRAPE CARDIOVASCULAR INCISE (DRAPES)
DRAPE SLUSH/WARMER DISC (DRAPES) ×3 IMPLANT
DRAPE SRG 135X102X78XABS (DRAPES) IMPLANT
DRESSING ALLEVYN LITE 5X5 NADH (GAUZE/BANDAGES/DRESSINGS) ×4 IMPLANT
DRSG ALLEVYN LITE 5X5 NADH (GAUZE/BANDAGES/DRESSINGS) ×6
DRSG AQUACEL AG ADV 3.5X14 (GAUZE/BANDAGES/DRESSINGS) IMPLANT
DRSG VAC ATS LRG SENSATRAC (GAUZE/BANDAGES/DRESSINGS) ×3 IMPLANT
DRSG VERSA FOAM LRG 10X15 (GAUZE/BANDAGES/DRESSINGS) ×3 IMPLANT
ELECT CAUTERY BLADE 6.4 (BLADE) ×6 IMPLANT
ELECT REM PT RETURN 9FT ADLT (ELECTROSURGICAL) ×3
ELECTRODE REM PT RTRN 9FT ADLT (ELECTROSURGICAL) ×2 IMPLANT
FELT TEFLON 1X6 (MISCELLANEOUS) IMPLANT
GAUZE SPONGE 4X4 12PLY STRL (GAUZE/BANDAGES/DRESSINGS) IMPLANT
GAUZE SPONGE 4X4 16PLY XRAY LF (GAUZE/BANDAGES/DRESSINGS) ×3 IMPLANT
GLOVE BIO SURGEON STRL SZ 6.5 (GLOVE) ×3 IMPLANT
GLOVE NEODERM STRL 7.5  LF PF (GLOVE) ×4
GLOVE NEODERM STRL 7.5 LF PF (GLOVE) ×4 IMPLANT
GLOVE SURG NEODERM 7.5  LF PF (GLOVE) ×2
GOWN STRL REUS W/ TWL LRG LVL3 (GOWN DISPOSABLE) ×4 IMPLANT
GOWN STRL REUS W/TWL LRG LVL3 (GOWN DISPOSABLE) ×6
HANDPIECE INTERPULSE COAX TIP (DISPOSABLE) ×3
HEMOSTAT POWDER SURGIFOAM 1G (HEMOSTASIS) IMPLANT
HEMOSTAT SURGICEL 2X14 (HEMOSTASIS) IMPLANT
HOLDER TRACH TUBE VELCRO 19.5 (MISCELLANEOUS) ×3 IMPLANT
KIT BASIN OR (CUSTOM PROCEDURE TRAY) ×3 IMPLANT
KIT SUCTION CATH 14FR (SUCTIONS) IMPLANT
KIT TURNOVER KIT B (KITS) ×3 IMPLANT
NS IRRIG 1000ML POUR BTL (IV SOLUTION) ×3 IMPLANT
PACK E OPEN HEART (SUTURE) IMPLANT
PACK GENERAL/GYN (CUSTOM PROCEDURE TRAY) ×3 IMPLANT
PACK OPEN HEART (CUSTOM PROCEDURE TRAY) IMPLANT
PACK UNIVERSAL I (CUSTOM PROCEDURE TRAY) ×3 IMPLANT
PAD ARMBOARD 7.5X6 YLW CONV (MISCELLANEOUS) ×6 IMPLANT
PAD ELECT DEFIB RADIOL ZOLL (MISCELLANEOUS) IMPLANT
PENCIL BUTTON HOLSTER BLD 10FT (ELECTRODE) ×3 IMPLANT
POSITIONER HEAD DONUT 9IN (MISCELLANEOUS) IMPLANT
SET HNDPC FAN SPRY TIP SCT (DISPOSABLE) ×2 IMPLANT
SPONGE INTESTINAL PEANUT (DISPOSABLE) ×3 IMPLANT
SUT BONE WAX W31G (SUTURE) IMPLANT
SUT MNCRL AB 3-0 PS2 18 (SUTURE) IMPLANT
SUT PDS AB 1 CTX 36 (SUTURE) IMPLANT
SUT PROLENE 3 0 SH DA (SUTURE) IMPLANT
SUT PROLENE 5 0 C 1 36 (SUTURE) IMPLANT
SUT PROLENE 6 0 C 1 30 (SUTURE) IMPLANT
SUT PROLENE 8 0 BV175 6 (SUTURE) IMPLANT
SUT PROLENE BLUE 7 0 (SUTURE) ×3 IMPLANT
SUT SILK 2 0 SH CR/8 (SUTURE) ×3 IMPLANT
SUT SILK 3 0 SH CR/8 (SUTURE) IMPLANT
SUT STEEL 6MS V (SUTURE) IMPLANT
SUT STEEL SZ 6 DBL 3X14 BALL (SUTURE) IMPLANT
SUT VIC AB 2-0 CTX 27 (SUTURE) IMPLANT
SUT VIC AB 3-0 X1 27 (SUTURE) IMPLANT
SYSTEM SAHARA CHEST DRAIN ATS (WOUND CARE) IMPLANT
TOWEL GREEN STERILE (TOWEL DISPOSABLE) IMPLANT
TOWEL GREEN STERILE FF (TOWEL DISPOSABLE) ×3 IMPLANT
TRAY FOLEY SLVR 16FR TEMP STAT (SET/KITS/TRAYS/PACK) IMPLANT
TUBE TRACH SHILEY 8 DIST CUF (TUBING) ×3 IMPLANT
UNDERPAD 30X30 (UNDERPADS AND DIAPERS) IMPLANT
WATER STERILE IRR 1000ML POUR (IV SOLUTION) IMPLANT
WATER STERILE IRR 1000ML UROMA (IV SOLUTION) IMPLANT

## 2019-08-29 NOTE — Progress Notes (Signed)
Patient ID: Christian [name], male   DOB: 1945/07/26, 74 y.o.   MRN: [id number]     Advanced Heart Failure Rounding Note  PCP-Cardiologist: No primary care provider on file.   Subjective:    08/09/19 Admitted with NSTEM and ? Small RLL PE. Weight 213 pounds.  08/15/19 CABG x4 on 08/15/19 08/18/19 Cardiac arrest due to tamponade. Chest opened for washout 4/26 Chest washout in OR 4/29 CVVH started  Remains intubated and sedated. FiO2 0.3.    More awake this morning, following commands.    Briefly on phenylephrine overnight, now off pressors again.    Has AKI. Suspect ATN from cardiogenic shock/pericardial tamponade. CVVH initiated 4/29 to assist w/ volume removal. I/O net 1105 negative with weight below his pre-op baseline.  CVP 8.  UF ongoing 50 cc/hr net.   On vanc/cefepime in setting of open chest. Afebrile. WBCs up to 19, hgb down to 7.7.   Echo 4/25: EF 40-45% with some residual pericardial clot.  Objective:   Weight Range: 93.8 kg Body mass index is 30.54 kg/m.   Vital Signs:   Temp:  [95.9 F (35.5 C)-99.5 F (37.5 C)] 98.6 F (37 C) (05/04 0515) Pulse Rate:  [27-109] 63 (05/04 0515) Resp:  [18-33] 24 (05/04 0515) BP: (91-133)/(38-59) 101/38 (05/04 0323) SpO2:  [97 %-100 %] 99 % (05/04 0515) Arterial Line BP: (88-139)/(36-55) 126/47 (05/04 0515) FiO2 (%):  [30 %-40 %] 30 % (05/04 0400) Weight:  [93.8 kg] 93.8 kg (05/04 0620) Last BM Date: 08/28/19  Weight change: Filed Weights   08/27/19 0615 08/28/19 0600 08/29/19 0620  Weight: 101.5 kg 96.3 kg 93.8 kg    Intake/Output:   Intake/Output Summary (Last 24 hours) at 08/29/2019 0740 Last data filed at 08/29/2019 0700 Gross per 24 hour  Intake 2052.07 ml  Output 3157 ml  Net -1104.93 ml      Physical Exam   General: NAD, awake on vent Neck: No JVD, no thyromegaly or thyroid nodule.  Lungs: Clear to auscultation bilaterally with normal respiratory effort. CV: Nondisplaced PMI.  Heart regular S1/S2, no S3/S4,  no murmur.  No peripheral edema.   Abdomen: Soft, nontender, no hepatosplenomegaly, no distention.  Skin: Intact without lesions or rashes.  Neurologic: Awake, following commands Extremities: No clubbing or cyanosis.  HEENT: Normal.   Telemetry   NSR around 100 with PACs  (personally reviewed)  EKG    N/a   Labs    CBC Recent Labs    08/29/19 0323 08/29/19 0635  WBC 18.0* 19.1*  NEUTROABS PENDING  --   HGB 7.7* 7.7*  HCT 24.8* 24.8*  MCV 96.1 96.1  PLT 224 782   Basic Metabolic Panel Recent Labs    08/28/19 0424 08/28/19 0424 08/28/19 1631 08/29/19 0323  NA 137   < > 138 136  K 4.7   < > 4.5 4.0  CL 101   < > 102 100  CO2 23   < > 23 23  GLUCOSE 112*   < > 131* 158*  BUN 44*   < > 44* 48*  CREATININE 2.43*   < > 2.46* 2.46*  CALCIUM 8.8*   < > 8.9 8.7*  MG 2.9*  --   --  2.8*  PHOS 3.9   < > 3.8 3.4   < > = values in this interval not displayed.   Liver Function Tests Recent Labs    08/28/19 1631 08/29/19 0323  ALBUMIN 2.2* 2.3*   No results for input(s): LIPASE,  AMYLASE in the last 72 hours. Cardiac Enzymes No results for input(s): CKTOTAL, CKMB, CKMBINDEX, TROPONINI in the last 72 hours.  BNP: BNP (last 3 results) Recent Labs    08/09/19 1240  BNP 89.1    ProBNP (last 3 results) Recent Labs    11/23/18 1120 08/09/19 0922  PROBNP 18.0 78.0     D-Dimer No results for input(s): DDIMER in the last 72 hours. Hemoglobin A1C No results for input(s): HGBA1C in the last 72 hours. Fasting Lipid Panel Recent Labs    08/27/19 1611  TRIG 71   Thyroid Function Tests No results for input(s): TSH, T4TOTAL, T3FREE, THYROIDAB in the last 72 hours.  Invalid input(s): FREET3  Other results:   Imaging    DG Abd Portable 1V  Result Date: 08/28/2019 CLINICAL DATA:  Feeding tube placement. EXAM: PORTABLE ABDOMEN - 1 VIEW COMPARISON:  Radiographs 08/27/2019 FINDINGS: 1210 hours. The feeding tube has been advanced, and now follows a course  consistent with tip in the 3rd portion of the duodenum. A nasogastric tube extends to the distal stomach. Left chest tube and mediastinal drains are in place. Mild gaseous distension of the bowel, a small left pleural effusion and left basilar atelectasis appear unchanged. IMPRESSION: Feeding tube tip projects to the level of the 3rd portion of the duodenum. Electronically Signed   By: Richardean Sale M.D.   On: 08/28/2019 12:17     Medications:     Scheduled Medications: . sodium chloride   Intravenous Once  . sodium chloride   Intravenous Once  . aspirin  81 mg Per Tube Daily  . atorvastatin  40 mg Per Tube Daily  . B-complex with vitamin C  1 tablet Per Tube Daily  . bisacodyl  10 mg Oral Daily   Or  . bisacodyl  10 mg Rectal Daily  . chlorhexidine gluconate (MEDLINE KIT)  15 mL Mouth Rinse BID  . Chlorhexidine Gluconate Cloth  6 each Topical Daily  . clopidogrel  75 mg Per Tube Daily  . docusate  100 mg Per Tube BID  . feeding supplement (PRO-STAT SUGAR FREE 64)  60 mL Per Tube BID  . insulin aspart  0-24 Units Subcutaneous Q4H  . isosorbide dinitrate  20 mg Per Tube TID  . lactulose  30 g Per Tube Daily  . mouth rinse  15 mL Mouth Rinse 10 times per day  . metoCLOPramide (REGLAN) injection  5 mg Intravenous Q8H  . pantoprazole sodium  40 mg Per Tube Daily  . sodium chloride flush  10-40 mL Intracatheter Q12H  . sodium chloride flush  3 mL Intravenous Q12H  . trospium  20 mg Per Tube BID    Infusions: .  prismasol BGK 4/2.5 500 mL/hr at 08/29/19 0524  . sodium chloride    . amiodarone 30 mg/hr (08/29/19 0700)  . ceFEPime (MAXIPIME) IV Stopped (08/29/19 0217)  . feeding supplement (VITAL AF 1.2 CAL) 65 mL/hr at 08/29/19 0600  . heparin 850 Units/hr (08/29/19 0700)  . lactated ringers    . lactated ringers 20 mL/hr at 08/20/19 1200  . magnesium sulfate bolus IVPB    . phenylephrine (NEO-SYNEPHRINE) Adult infusion Stopped (08/29/19 0719)  . prismasol BGK 0/2.5 300 mL/hr at  08/29/19 0618  . prismasol BGK 4/2.5 1,500 mL/hr at 08/29/19 0459  . vancomycin Stopped (08/29/19 0045)    PRN Medications: cromolyn, dextrose, heparin, heparin, hydrALAZINE, HYDROmorphone (DILAUDID) injection, ondansetron (ZOFRAN) IV, oxyCODONE, sodium chloride flush, sodium chloride flush  Assessment/Plan   1. CAD: s/p PDA angioplasty + stent in 02/2014.  Admitted for NSTEMI 4/14. Hs Trop peaked at 3,309.  LHC w/ severe 3V CAD. Echo w/ reduced EF 30-35%. RV ok.  S/p CABG x 4 on 4/20 (LIMA-LAD, RIMA to PDA, Lt radial artery graft to 1st OM and distal OM branches of LCx).  Chest reopened urgently at the bedside for cardiac tamponade and removal of clot on 4/23  - continue ASA/Plavix.  Will need to stop one of the antiplatelet agents ultimately as he will be on anticoagulation at home.  - Radial harvest. On isordil 20 tid.  - Atorvastatin 40 daily.   - Chest washout today. Will need eventual closure.  2. Acute Systolic Heart Failure: Ischemic Cardiomyopathy -> cardiogenic shock.  LVEF severely reduced at 30-35%, in the setting of NSTEM/ Multivessel CAD, RV ok initially.  Underwent urgent sternotomy at the bedside for cardiac tamponade and removal of clot on 4/23. Chest remains open. Luiz Blare numbers have been concerning for RV failure (swan now out).  Echo 4/24 EF 40 to 45% with inotropic support, small amount of residual pericardial clot, RV moderately hypokinetic.  Off pressors/inotropes. CVP 8, weight now down to below his pre-op baseline. - Can decrease UF by CVVH to net even to -50 cc/hr range (needed albumin and phenylephrine overnight).       3.  Postoperative acute hypoxic respiratory failure: Reintubated on 4/23 due to cardiogenic shock and return to OR. - To get tracheostomy today with chest washout.  - CXR today.  4. AKI: Baseline SCr 0.9. Creatinine peaked to 5.3, suspect ATN in setting of cardiogenic shock/tamponade.  Now on CVVH for volume removal, no urine charted yesterday.  -  As above, can keep net UF near even.   5.  Shock liver: Due to cardiac arrest on 4/23.  LFTs trending down.  6. 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 Maintaining NSR with apparently some atrial fibrillation last night.   - Amiodarone back to IV.  - Low dose heparin gtt currently while chest open.   7.  Possible small right lower lobe PE: Chest CT 4/14 w/ small RLL PE.  Bilateral Venous Dopplers negative for DVT.  - Low dose heparin has been started.   - Anticipate switching eliquis prior to d/c.  8. T2DM: Hgb A1c 7.7 -Remains on insulin 9. Pericardial tamponade: 4/23 return to OR for clot removal, chest wall left open. Patient had been on heparin gtt for DCCV of atrial flutter.  - Timing of chest closure per surgery.  10. Ileus: Improving, on Reglan.  - Continue Reglan 5 mg IV every 8 hrs.  11. Neuro: Following commands.  CT head negative. - Limit sedation   Length of Stay: 20  CRITICAL CARE Performed by: Loralie Champagne  Total critical care time: 35 minutes  Critical care time was exclusive of separately billable procedures and treating other patients.  Critical care was necessary to treat or prevent imminent or life-threatening deterioration.  Critical care was time spent personally by me on the following activities: development of treatment plan with patient and/or surrogate as well as nursing, discussions with consultants, evaluation of patient's response to treatment, examination of patient, obtaining history from patient or surrogate, ordering and performing treatments and interventions, ordering and review of laboratory studies, ordering and review of radiographic studies, pulse oximetry and re-evaluation of patient's condition.   Loralie Champagne 08/29/2019 7:40 AM

## 2019-08-29 NOTE — Transfer of Care (Signed)
Immediate Anesthesia Transfer of Care Note  Patient: Christian [name]  Procedure(s) Performed: CHEST WOUND WASHOUT POST OPERATIVE OPEN HEART (N/A Chest) Tracheostomy Wound Vac change  Patient Location: SICU  Anesthesia Type:General  Level of Consciousness: Patient remains intubated per anesthesia plan  Airway & Oxygen Therapy: Patient remains intubated per anesthesia plan and Patient placed on Ventilator (see vital sign flow sheet for setting)  Post-op Assessment: Report given to RN and Post -op Vital signs reviewed and stable  Post vital signs: Reviewed and stable  Last Vitals:  Vitals Value Taken Time  BP    Temp    Pulse    Resp    SpO2      Last Pain:  Vitals:   08/29/19 0800  TempSrc: Esophageal  PainSc: 0-No pain      Patients Stated Pain Goal: 2 (17/61/60 7371)  Complications: No apparent anesthesia complications

## 2019-08-29 NOTE — Anesthesia Preprocedure Evaluation (Addendum)
Anesthesia Evaluation  Patient identified by MRN, date of birth, ID band Patient unresponsive    Reviewed: Allergy & Precautions, H&P , NPO status , Patient's Chart, lab work & pertinent test results, Unable to perform ROS - Chart review only  History of Anesthesia Complications Negative for: history of anesthetic complications  Airway Mallampati: Intubated       Dental  (+) Edentulous Upper, Edentulous Lower   Pulmonary former smoker,  Vent dependent    + decreased breath sounds      Cardiovascular hypertension, + CAD, + Past MI, + CABG and +CHF   Rhythm:Regular Rate:Normal  Echo 4/24/20221 1. There prominent respiratory displacement of the interventricular septum, consistent with enhanced ventricular interdependence, likely due to constrictive physiology.. Left ventricular ejection fraction, by  estimation, is 45 to 50%. The left ventricle  has mildly decreased function. The left ventricle demonstrates global hypokinesis. Left ventricular diastolic parameters are consistent with Grade II diastolic dysfunction (pseudonormalization). Elevated left atrial  pressure.  2. Right ventricular systolic function was not well visualized. The right ventricular size is normal. Tricuspid regurgitation signal is inadequate for assessing PA pressure.  3. No pericardial fluid is see, but the pericardium is very thick and thrombus in the pericardial space cannot be ecluded.  4. The mitral valve is normal in structure. No evidence of mitral valve regurgitation.  5. The aortic valve is grossly normal. Aortic valve regurgitation is not visualized. Mild aortic valve sclerosis is present, with no evidence of aortic valve stenosis.    Neuro/Psych  Neuromuscular disease negative psych ROS   GI/Hepatic Neg liver ROS, hiatal hernia,   Endo/Other  diabetes  Renal/GU Renal InsufficiencyRenal disease     Musculoskeletal   Abdominal   Peds  Hematology  (+) Blood dyscrasia, anemia ,   Anesthesia Other Findings   Reproductive/Obstetrics                            Anesthesia Physical  Anesthesia Plan  ASA: IV  Anesthesia Plan: General   Post-op Pain Management:    Induction: Inhalational and Intravenous  PONV Risk Score and Plan: 2 and Treatment may vary due to age or medical condition  Airway Management Planned: Oral ETT  Additional Equipment:   Intra-op Plan:   Post-operative Plan: Post-operative intubation/ventilation  Informed Consent:     Only emergency history available and History available from chart only  Plan Discussed with: CRNA  Anesthesia Plan Comments:         Anesthesia Quick Evaluation

## 2019-08-29 NOTE — Progress Notes (Signed)
Chaplain engaged in initial visit with Christian [name] and his family.  Chaplain offered support.  Chaplain will follow-up.

## 2019-08-29 NOTE — Progress Notes (Signed)
Patient ID: Christian [name], male   DOB: [birth year], 74 y.o.   MRN: [id number] S: Developed A fib with RVR and hypotension last night but off neo. O:BP 119/80   Pulse 92   Temp 99 F (37.2 C) (Esophageal)   Resp (!) 30   Ht 5' 9"  (2.841 m)   Wt 93.8 kg   SpO2 100%   BMI 30.54 kg/m   Intake/Output Summary (Last 24 hours) at 08/29/2019 0849 Last data filed at 08/29/2019 0800 Gross per 24 hour  Intake 2050.64 ml  Output 3016 ml  Net -965.36 ml   Intake/Output: I/O last 3 completed shifts: In: 2994 [I.V.:1185.9; NG/GT:990; IV Piggyback:818] Out: 6207 [Emesis/NG output:1000; Drains:215; LKGMW:1027; Chest Tube:90]  Intake/Output this shift:  Total I/O In: 25.5 [I.V.:25.5] Out: 96 [Other:96] Weight change: -2.5 kg OZD:GUYQIHKVQ/QVZDGLO CVS: IRR, no rub Resp: occ rhonchi Abd: distended, +BS, soft Ext:1+ edema of sacrum 1+ edema of hands, staples in place  Recent Labs  Lab 08/23/19 0552 08/23/19 0552 08/24/19 0405 08/25/19 0326 08/26/19 0339 08/26/19 1546 08/27/19 0328 08/27/19 1611 08/28/19 0424 08/28/19 1631 08/29/19 0323  NA 138   < > 140   < > 137 138 135 137 137 138 136  K 4.0   < > 4.2   < > 4.5 5.2* 5.1 5.2* 4.7 4.5 4.0  CL 103   < > 102   < > 103 104 102 101 101 102 100  CO2 22   < > 22   < > 23 23 23 23 23 23 23   GLUCOSE 294*   < > 204*   < > 196* 218* 206* 134* 112* 131* 158*  BUN 90*   < > 114*   < > 64* 57* 58* 46* 44* 44* 48*  CREATININE 4.64*   < > 5.27*   < > 2.52* 2.31* 2.40* 2.31* 2.43* 2.46* 2.46*  ALBUMIN 1.5*   < > 1.6*   < > 1.8* 1.9* 2.0* 2.2* 2.1* 2.2* 2.3*  CALCIUM 7.6*   < > 8.2*   < > 8.3* 8.7* 8.6* 9.0 8.8* 8.9 8.7*  PHOS 6.7*  --   --    < > 4.2 4.2 3.9 4.0 3.9 3.8 3.4  AST 50*  --  59*  --   --   --   --   --   --   --   --   ALT 220*  --  169*  --   --   --   --   --   --   --   --    < > = values in this interval not displayed.   Liver Function Tests: Recent Labs  Lab 08/23/19 0552 08/23/19 0552 08/24/19 0405 08/25/19 0326  08/28/19 0424 08/28/19 1631 08/29/19 0323  AST 50*  --  59*  --   --   --   --   ALT 220*  --  169*  --   --   --   --   ALKPHOS 82  --  115  --   --   --   --   BILITOT 0.8  --  0.7  --   --   --   --   PROT 4.2*  --  4.3*  --   --   --   --   ALBUMIN 1.5*   < > 1.6*   < > 2.1* 2.2* 2.3*   < > = values in this interval not displayed.  No results for input(s): LIPASE, AMYLASE in the last 168 hours. No results for input(s): AMMONIA in the last 168 hours. CBC: Recent Labs  Lab 08/26/19 0335 08/26/19 0335 08/27/19 0328 08/27/19 0328 08/28/19 0849 08/29/19 0323 08/29/19 0635  WBC 13.1*   < > 16.9*   < > 17.4* 18.0* 19.1*  NEUTROABS  --   --   --   --   --  14.9*  --   HGB 9.0*   < > 8.6*   < > 8.8* 7.7* 7.7*  HCT 28.3*   < > 27.4*   < > 28.5* 24.8* 24.8*  MCV 95.6  --  95.1  --  96.0 96.1 96.1  PLT 211   < > 236   < > 249 224 229   < > = values in this interval not displayed.   Cardiac Enzymes: No results for input(s): CKTOTAL, CKMB, CKMBINDEX, TROPONINI in the last 168 hours. CBG: Recent Labs  Lab 08/28/19 1541 08/28/19 2013 08/29/19 0109 08/29/19 0314 08/29/19 0813  GLUCAP 118* 98 150* 158* 149*    Iron Studies: No results for input(s): IRON, TIBC, TRANSFERRIN, FERRITIN in the last 72 hours. Studies/Results: DG Abd Portable 1V  Result Date: 08/28/2019 CLINICAL DATA:  Feeding tube placement. EXAM: PORTABLE ABDOMEN - 1 VIEW COMPARISON:  Radiographs 08/27/2019 FINDINGS: 1210 hours. The feeding tube has been advanced, and now follows a course consistent with tip in the 3rd portion of the duodenum. A nasogastric tube extends to the distal stomach. Left chest tube and mediastinal drains are in place. Mild gaseous distension of the bowel, a small left pleural effusion and left basilar atelectasis appear unchanged. IMPRESSION: Feeding tube tip projects to the level of the 3rd portion of the duodenum. Electronically Signed   By: Richardean Sale M.D.   On: 08/28/2019 12:17   .  sodium chloride   Intravenous Once  . sodium chloride   Intravenous Once  . aspirin  81 mg Per Tube Daily  . atorvastatin  40 mg Per Tube Daily  . B-complex with vitamin C  1 tablet Per Tube Daily  . bisacodyl  10 mg Oral Daily   Or  . bisacodyl  10 mg Rectal Daily  . chlorhexidine gluconate (MEDLINE KIT)  15 mL Mouth Rinse BID  . Chlorhexidine Gluconate Cloth  6 each Topical Daily  . clopidogrel  75 mg Per Tube Daily  . docusate  100 mg Per Tube BID  . feeding supplement (PRO-STAT SUGAR FREE 64)  60 mL Per Tube BID  . insulin aspart  0-24 Units Subcutaneous Q4H  . isosorbide dinitrate  20 mg Per Tube TID  . lactulose  30 g Per Tube Daily  . mouth rinse  15 mL Mouth Rinse 10 times per day  . metoCLOPramide (REGLAN) injection  5 mg Intravenous Q8H  . pantoprazole sodium  40 mg Per Tube Daily  . sodium chloride flush  10-40 mL Intracatheter Q12H  . sodium chloride flush  3 mL Intravenous Q12H  . trospium  20 mg Per Tube BID    BMET    Component Value Date/Time   NA 136 08/29/2019 0323   NA 141 06/19/2019 1130   K 4.0 08/29/2019 0323   CL 100 08/29/2019 0323   CO2 23 08/29/2019 0323   GLUCOSE 158 (H) 08/29/2019 0323   BUN 48 (H) 08/29/2019 0323   BUN 21 06/19/2019 1130   CREATININE 2.46 (H) 08/29/2019 0323   CALCIUM 8.7 (L) 08/29/2019 2694  GFRNONAA 25 (L) 08/29/2019 0323   GFRAA 29 (L) 08/29/2019 0323   CBC    Component Value Date/Time   WBC 19.1 (H) 08/29/2019 0635   RBC 2.58 (L) 08/29/2019 0635   HGB 7.7 (L) 08/29/2019 0635   HCT 24.8 (L) 08/29/2019 0635   PLT 229 08/29/2019 0635   MCV 96.1 08/29/2019 0635   MCH 29.8 08/29/2019 0635   MCHC 31.0 08/29/2019 0635   RDW 17.6 (H) 08/29/2019 0635   LYMPHSABS 1.1 08/29/2019 0323   MONOABS 1.5 (H) 08/29/2019 0323   EOSABS 0.1 08/29/2019 0323   BASOSABS 0.1 08/29/2019 0323     Assessment/Plan:  1. AKI- following cardiac cath, cabg, cardiogenic shock, cardiac arrest due to tamponade, and acute on chronic CHF.   Started CRRT on 08/24/19 due to anasarca and need for volume removal to close his open chest. 1. Continue with CRRT 2. Volume improved, will decrease UF goal to 0-50 ml/hr.  CVP of 8 and low BP. 2. CAD s/p CABG x 4 complicated by cardiac tamponade s/p reopened chest urgently on 08/18/19 with wound vac. 1. For trach and washout today  2. Hopefully will be able to close chest wound soon.  3. Acute systolic heart failure due to ischemic cardiomyopathy- volume markedly improved with CRRT. 4. Postoperative acute hypoxic respiratory failure- reintubated 08/18/19 and likely aspiration. 5. Anemia- stable and transfuse prn.  6. Ileus- had BM last night but still distended and tense.  Per primary.  Donetta Potts, MD [employer] [phone]

## 2019-08-29 NOTE — Progress Notes (Signed)
Patient becoming hypotensive with MAPs in the mid 50's in ST with frequent PVC's,  not tolerating any fluid removal from CRRT,  Dr.Van Trigt made aware  Verbal order to stop pulling fluid with CRRT and give:   150mg  bolus of amiodarone  1 albumin- 5%  And start on phenylephrine 0-163mcg  with the goal of a SBP> 100.

## 2019-08-29 NOTE — Anesthesia Postprocedure Evaluation (Signed)
Anesthesia Post Note  Patient: Christian [name]  Procedure(s) Performed: CHEST WOUND WASHOUT POST OPERATIVE OPEN HEART (N/A Chest) Tracheostomy Wound Vac change     Patient location during evaluation: SICU Anesthesia Type: General Level of consciousness: sedated and patient remains intubated per anesthesia plan Pain management: pain level controlled Vital Signs Assessment: post-procedure vital signs reviewed and stable Respiratory status: patient remains intubated per anesthesia plan and patient on ventilator - see flowsheet for VS Cardiovascular status: stable Anesthetic complications: no    Last Vitals:  Vitals:   08/29/19 1945 08/29/19 2000  BP: (!) 98/44 (!) 94/55  Pulse: (!) 102   Resp: 18   Temp:  36.4 C  SpO2: 100%     Last Pain:  Vitals:   08/29/19 2000  TempSrc: Oral  PainSc: [place]

## 2019-08-29 NOTE — Hospital Discharge Follow-Up (Cosign Needed)
History and Physical Interval Note:  08/29/2019 7:33 AM  Christian [name]  has presented today for surgery, with the diagnosis of OPEN CHEST.  The various methods of treatment have been discussed with the patient and family. After consideration of risks, benefits and other options for treatment, the patient has consented to  Procedure(s): CHEST WASHOUT POST OPERATIVE OPEN HEART (N/A) APPLICATION OF WOUND VAC (N/A) OPEN TRACHEOSTOMY as a surgical intervention.  The patient's history has been reviewed, patient examined, no change in status, stable for surgery.  I have reviewed the patient's chart and labs.  Questions were answered to the patient's satisfaction.     [name]

## 2019-08-29 NOTE — Progress Notes (Signed)
NAME:  Christian [name], MRN:  [id number], DOB:  [birth year], LOS: 25 ADMISSION DATE:  08/09/2019, CONSULTATION DATE:  08/28/2019 REFERRING MD:  Orvan Seen - [facility], CHIEF COMPLAINT:  Respiratory failure.    HPI/course in hospital   74 year old man who presented with progressive dyspnea.  He was admitted 4/14 and was incidentally found to have a right lower lobe pulmonary embolism.  Overall the picture was that of decompensated heart failure with a non-STEMI.  Coronary angiography revealed triple-vessel disease and the patient was referred for cardiac surgery.  EF was found to be reduced at that time at  He underwent four-vessel CABG 4/20 with good quality distal targets.  Cardiac function remained reduced post bypass.  Unable to close chest due to surrounding edema.  Underwent attempted closure with VAC placement 4/22.   Cardiac arrest 4/23 with repeated washouts with VAC placements on 4/26 and 4/29.  Significant volume overload postoperatively.  Acute kidney injury.  Currently on CRRT for fluid removal.   Past Medical History   Past Medical History:  Diagnosis Date  . BPH (benign prostatic hypertrophy)   . Coronary artery disease   . Diabetes mellitus without complication ([place])   . Dry eyes left  . Hiatal hernia   . 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  LIMA TO LAD, RIMA TO PDA, RADIAL ARTERY TO CIRC AND SEQUENTIALLY TO OM1. 08/15/2019  . Hyperlipidemia   . Hypertension   . Incomplete bladder emptying   . Nocturia   . 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 SOB (shortness of breath) on exertion      Past Surgical History:  Procedure Laterality Date  . APPLICATION OF WOUND VAC N/A 08/24/2019   Procedure: APPLICATION OF WOUND VAC;  Surgeon: [name], MD;  Location: [facility];  Service: Thoracic;  Laterality: N/A;  . CARDIAC CATHETERIZATION      . CORONARY ARTERY BYPASS GRAFT N/A 08/15/2019   Procedure: CORONARY ARTERY BYPASS GRAFTING (CABG), x 4 using bilateral IMAs and left radial artery .  LIMA TO LAD, RIMA TO PDA, RADIAL ARTERY TO CIRC AND SEQUENTIALLY TO OM1.;  Surgeon: [name], MD;  Location: [place];  Service: Open Heart Surgery;  Laterality: N/A;  . CORONARY STENT PLACEMENT  02/27/2014   distal rt/pd coronary       dr Einar Gip  . CYSTO/ BLADDER BIOPSY'S/ CAUTHERIZATION  01-14-2004  DR Gaynelle Arabian  . EXPLORATION POST OPERATIVE OPEN HEART N/A 08/16/2019   Procedure: Chest Closure S?P CABG WITH APPLICATION OF PREVENA  INCISIONAL WOUND VAC;  Surgeon: [name], MD;  Location: [facility];  Service: Open Heart Surgery;  Laterality: N/A;  . EXPLORATION POST OPERATIVE OPEN HEART N/A 08/21/2019   Procedure: CHEST WASHOUT S/P OPEN CHEST;  Surgeon: [name], MD;  Location: [place];  Service: Open Heart Surgery;  Laterality: N/A;  Open chest with Esmark dressing with Ioban sealant coverage.  . EXPLORATION POST OPERATIVE OPEN HEART N/A 08/18/2019   Procedure: EXPLORATION POST OPERATIVE OPEN HEART (performed 04/23 on unit);  Surgeon: [name], MD;  Location: [place];  Service: Open Heart Surgery;  Laterality: N/A;  . EXPLORATION POST OPERATIVE OPEN HEART N/A 08/24/2019   Procedure: CHEST WASHOUT POST OPERATIVE OPEN HEART;  Surgeon: [name], MD;  Location: [place];  Service: Open Heart Surgery;  Laterality: N/A;  . LEFT HEART CATH AND CORONARY ANGIOGRAPHY N/A 08/10/2019   Procedure: LEFT HEART CATH AND CORONARY ANGIOGRAPHY;  Surgeon: Nigel Mormon, MD;  Location: [place] [facility];  Service: Cardiovascular;  Laterality: N/A;  . LEFT HEART CATHETERIZATION WITH CORONARY ANGIOGRAM N/A 02/27/2014   Procedure: LEFT HEART CATHETERIZATION WITH CORONARY ANGIOGRAM;  Surgeon: Laverda Page, MD;  Location: [facility] CATH LAB;  Service: Cardiovascular;  Laterality: N/A;  . PERCUTANEOUS CORONARY STENT INTERVENTION (PCI-S)   02/27/2014   Procedure: PERCUTANEOUS CORONARY STENT INTERVENTION (PCI-S);  Surgeon: Laverda Page, MD;  Location: [facility] CATH LAB;  Service: Cardiovascular;;  rt PDA  3.0/28mm Promus stent  . RADIAL ARTERY HARVEST Left 08/15/2019   Procedure: Radial Artery Harvest;  Surgeon: [name], MD;  Location: [place];  Service: Open Heart Surgery;  Laterality: Left;  . TEE WITHOUT CARDIOVERSION N/A 08/15/2019   Procedure: TRANSESOPHAGEAL ECHOCARDIOGRAM (TEE);  Surgeon: [name], MD;  Location: [place];  Service: Open Heart Surgery;  Laterality: N/A;  . TRANSURETHRAL RESECTION OF PROSTATE  04/04/2012   Procedure: TRANSURETHRAL RESECTION OF THE PROSTATE WITH GYRUS INSTRUMENTS;  Surgeon: Ailene Rud, MD;  Location: [facility];  Service: Urology;  Laterality: N/A;  . TRANSURETHRAL RESECTION OF PROSTATE N/A 09/27/2014   Procedure: TRANSURETHRAL RESECTION OF THE PROSTATE ;  Surgeon: Carolan Clines, MD;  Location: [facility];  Service: Urology;  Laterality: N/A;  . UPPER GASTROINTESTINAL ENDOSCOPY       Review of Systems:   Review of Systems  Unable to perform ROS: Critical illness    Social History   reports that he quit smoking about 29 years ago. His smoking use included cigarettes. He has a 10.00 pack-year smoking history. He has never used smokeless tobacco. He reports current alcohol use. He reports that he does not use drugs.   Family History   His family history includes Diabetes in his brother, brother, father, and mother; Hypertension in his brother and mother.   Allergies No Known Allergies   Home Medications  Prior to Admission medications   Medication Sig Start Date End Date Taking? Authorizing Provider  aspirin EC 81 MG tablet Take 81 mg by mouth daily.   Yes [provider]  atorvastatin (LIPITOR) 20 MG tablet TAKE 1 TABLET BY MOUTH EVERY DAY IN THE EVENING Patient taking differently: Take 20 mg by mouth every evening.  06/22/19  Yes Adrian Prows,  MD  cloNIDine (CATAPRES) 0.2 MG tablet TAKE 1 TABLET (0.2 MG TOTAL) BY MOUTH DAILY. 06/22/19  Yes Adrian Prows, MD  cromolyn (OPTICROM) 4 % ophthalmic solution Place 1 drop into both eyes 4 (four) times daily as needed (dry eyes).  02/02/17  Yes [provider]  empagliflozin (JARDIANCE) 10 MG TABS tablet Take 10 mg by mouth daily before breakfast. 07/25/19  Yes Marrian Salvage, FNP  furosemide (LASIX) 20 MG tablet TAKE 1 TABLET BY MOUTH EVERY DAY Patient taking differently: Take 20 mg by mouth daily.  08/01/19  Yes Adrian Prows, MD  isosorbide mononitrate (IMDUR) 60 MG 24 hr tablet Take 60 mg by mouth daily.   Yes [provider]  metFORMIN (GLUCOPHAGE) 1000 MG tablet TAKE 1 TABLET TWICE A DAY WITH A MEAL Patient taking differently: Take 1,000 mg by mouth 2 (two) times daily with a meal.  07/31/19  Yes Hoyt Koch, MD  metoprolol succinate (TOPROL-XL) 100 MG 24 hr tablet Take 1 tablet (100 mg total) by mouth daily. Take with or  immediately following a meal. 07/24/19  Yes Adrian Prows, MD  nitroGLYCERIN (NITROSTAT) 0.4 MG SL tablet Place 1 tablet (0.4 mg total) under the tongue every 5 (five) minutes as needed for chest pain. 06/01/19  Yes Adrian Prows, MD  olmesartan-hydrochlorothiazide (BENICAR HCT) 40-25 MG tablet TAKE 1 TABLET BY MOUTH EVERY DAY Patient taking differently: Take 1 tablet by mouth daily.  07/03/19  Yes Adrian Prows, MD  PREVIDENT 5000 BOOSTER PLUS 1.1 % PSTE Take 1 application by mouth daily.  06/14/18  Yes [provider]  silodosin (RAPAFLO) 8 MG CAPS capsule Take 8 mg by mouth at bedtime. 07/27/19  Yes [provider]  spironolactone (ALDACTONE) 25 MG tablet Take 1 tablet (25 mg total) by mouth daily. 06/01/19 08/30/19 Yes Adrian Prows, MD  sucralfate (CARAFATE) 1 GM/10ML suspension Take 10 mLs (1 g total) by mouth 4 (four) times daily -  with meals and at bedtime. 07/24/19  Yes Marrian Salvage, FNP  tamsulosin [facility]) 0.4 MG CAPS capsule Take 0.4  mg by mouth daily after breakfast.  05/20/18  Yes [provider]  Trospium Chloride 60 MG CP24 Take 1 capsule by mouth daily. 07/31/19  Yes [provider]  verapamil (VERELAN PM) 240 MG 24 hr capsule TAKE 1 CAPSULE BY MOUTH EVERY DAY Patient taking differently: Take 240 mg by mouth daily.  07/24/19  Yes Adrian Prows, MD     Interim history/subjective:  Further hypotension last night. Given albumin and started on PE. Weaned off sedation  Objective   Blood pressure (!) 101/38, pulse 63, temperature 98.6 F (37 C), resp. rate (!) 24, height 5\' 9"  (1.753 m), weight 93.8 kg, SpO2 99 %. CVP:  [4 mmHg-19 mmHg] 7 mmHg  Vent Mode: PRVC FiO2 (%):  [30 %-40 %] 30 % Set Rate:  [18 bmp] 18 bmp Vt Set:  [560 mL] 560 mL PEEP:  [5 cmH20] 5 cmH20 Pressure Support:  [5 cmH20] 5 cmH20 Plateau Pressure:  [18 cmH20-22 cmH20] 18 cmH20   Intake/Output Summary (Last 24 hours) at 08/29/2019 0720 Last data filed at 08/29/2019 0700 Gross per 24 hour  Intake 2052.07 ml  Output 3157 ml  Net -1104.93 ml   Filed Weights   08/27/19 0615 08/28/19 0600 08/29/19 0620  Weight: 101.5 kg 96.3 kg 93.8 kg    Examination: Physical Exam Constitutional:      Appearance: He is obese.     Interventions: He is intubated.     Comments: On ventilator, off sedation.   HENT:     Nose:     Comments: Small bore feeding tube in place.     Mouth/Throat:     Comments: ETT tube in place with no skin breakdown Eyes:     General: No scleral icterus.    Extraocular Movements: Extraocular movements intact.     Conjunctiva/sclera: Conjunctivae normal.  Cardiovascular:     Rate and Rhythm: Normal rate and regular rhythm.     Arteriovenous access: right arteriovenous access is present.    Comments: Distant heart sounds.  Right IJ introducer and [place] HD catheter. Pulmonary:     Effort: Pulmonary effort is normal. He is intubated.     Comments: Tolerating PSV Chest:     Comments: Mild VAC dressing in  place Abdominal:     General: Bowel sounds are absent. There is distension.     Palpations: Abdomen is soft.  Genitourinary:    Comments: Condom catheter Musculoskeletal:     Right lower leg: 2+ Pitting Edema  present.     Left lower leg: 2+ Pitting Edema present.  Neurological:     Mental Status: He is alert.     GCS: GCS eye subscore is 4. GCS verbal subscore is 5. GCS motor subscore is 6.     Motor: Weakness present. No tremor.     Comments: Able to stick tongue now able to move limbs weakly.       Ancillary tests (personally reviewed)  CBC: Recent Labs  Lab 08/26/19 0335 08/27/19 0328 08/28/19 0849 08/29/19 0323 08/29/19 0635  WBC 13.1* 16.9* 17.4* 18.0* 19.1*  NEUTROABS  --   --   --  PENDING  --   HGB 9.0* 8.6* 8.8* 7.7* 7.7*  HCT 28.3* 27.4* 28.5* 24.8* 24.8*  MCV 95.6 95.1 96.0 96.1 96.1  PLT 211 236 249 224 818    Basic Metabolic Panel: Recent Labs  Lab 08/25/19 0326 08/25/19 1531 08/26/19 0339 08/26/19 1546 08/27/19 0328 08/27/19 1611 08/28/19 0424 08/28/19 1631 08/29/19 0323  NA 138   < > 137   < > 135 137 137 138 136  K 4.5   < > 4.5   < > 5.1 5.2* 4.7 4.5 4.0  CL 102   < > 103   < > 102 101 101 102 100  CO2 23   < > 23   < > 23 23 23 23 23   GLUCOSE 171*   < > 196*   < > 206* 134* 112* 131* 158*  BUN 93*   < > 64*   < > 58* 46* 44* 44* 48*  CREATININE 3.76*   < > 2.52*   < > 2.40* 2.31* 2.43* 2.46* 2.46*  CALCIUM 8.4*   < > 8.3*   < > 8.6* 9.0 8.8* 8.9 8.7*  MG 2.4  --  2.5*  --  2.6*  --  2.9*  --  2.8*  PHOS 6.2*   < > 4.2   < > 3.9 4.0 3.9 3.8 3.4   < > = values in this interval not displayed.   GFR: Estimated Creatinine Clearance: 30.7 mL/min (A) (by C-G formula based on SCr of 2.46 mg/dL (H)). Recent Labs  Lab 08/27/19 0328 08/28/19 0849 08/29/19 0323 08/29/19 0635  WBC 16.9* 17.4* 18.0* 19.1*    Liver Function Tests: Recent Labs  Lab 08/23/19 0552 08/23/19 0552 08/24/19 0405 08/25/19 0326 08/27/19 0328 08/27/19 1611  08/28/19 0424 08/28/19 1631 08/29/19 0323  AST 50*  --  59*  --   --   --   --   --   --   ALT 220*  --  169*  --   --   --   --   --   --   ALKPHOS 82  --  115  --   --   --   --   --   --   BILITOT 0.8  --  0.7  --   --   --   --   --   --   PROT 4.2*  --  4.3*  --   --   --   --   --   --   ALBUMIN 1.5*   < > 1.6*   < > 2.0* 2.2* 2.1* 2.2* 2.3*   < > = values in this interval not displayed.   No results for input(s): LIPASE, AMYLASE in the last 168 hours. No results for input(s): AMMONIA in the last 168 hours.  ABG  Component Value Date/Time   PHART 7.513 (H) 08/20/2019 0404   PCO2ART 34.7 08/20/2019 0404   PO2ART 216 (H) 08/20/2019 0404   HCO3 27.9 08/20/2019 0404   TCO2 29 08/20/2019 0404   ACIDBASEDEF 5.0 (H) 08/18/2019 2341   O2SAT 65.8 08/29/2019 0315     Coagulation Profile: No results for input(s): INR, PROTIME in the last 168 hours.  Cardiac Enzymes: No results for input(s): CKTOTAL, CKMB, CKMBINDEX, TROPONINI in the last 168 hours.  HbA1C: Hgb A1c MFr Bld  Date/Time Value Ref Range Status  08/14/2019 07:45 PM 7.7 (H) 4.8 - 5.6 % Final    Comment:    (NOTE) Pre diabetes:          5.7%-6.4% Diabetes:              >6.4% Glycemic control for   <7.0% adults with diabetes   07/24/2019 03:12 PM 7.7 (H) 4.6 - 6.5 % Final    Comment:    Glycemic Control Guidelines for People with Diabetes:Non Diabetic:  <6%Goal of Therapy: <7%Additional Action Suggested:  >8%     CBG: Recent Labs  Lab 08/28/19 1137 08/28/19 1541 08/28/19 2013 08/29/19 0109 08/29/19 0314  GLUCAP 129* 118* 98 150* 158*   CXR 08/27/19:  Small left effusion, retrocardiac consolidation. (personal interpretation)  Echo 4/24: EF 45% with Grade 2 DD.   Assessment & Plan:   Critically ill due to prolonged acute hypoxic hypercapnic respiratory failure requiring mechanical ventilation following cardiac surgery. While able to tolerate brief SBT, doubt will be able to protect airway if  extubated. - Recommend tracheostomy placement at time of next OR for chest closure. - Keep sedation off and continue daily SBT.   Acute decompensated systolic and diastolic heart failure. Now off all vasoactive infusions. - Continue ISDN and HDLZ for HF management.   Critically ill due to acute kidney injury requiring CRRT to control volume overload and electrolyte imbalances. - Continue to UF to remove edema with primary goal to permit definitive chest closure.   Status post CABG with multiple chest explorations and VAC dressing still in place - For eventual closure this week.  Generalized frailty.  Weakness of critical illness. [place] Suspect much of the fluid is now extravascular due to cachexia and immobility - Limit sedatives to promote interactivity and help regain function.  Gastroparesis of acute illness, narcotic use.  - Clamp OGT post OR today.   Daily Goals Checklist  Pain/Anxiety/Delirium protocol (if indicated): intermittent dilaudid only.  VAP protocol (if indicated): bundle in place.  Respiratory support goals: daily SBT Blood pressure target: Keep MAP >65 DVT prophylaxis: on IV heparin Nutritional status and feeding goals: high nutritional risk. Move tube into small bowel. Assess nutritional adequacy, target 2g/kg/d protein intake. GI prophylaxis: Pantoprazole Fluid status goals: continue to UF at -100 Urinary catheter: condom  Central lines: right IJ and [place], arterial line Glucose control: currently euglycemic Mobility/therapy needs: will need aggressive therapy. Antibiotic de-escalation: prophylaxis for open chest.  Home medication reconciliation: on hold Daily labs: CBC, BMP Code Status: full  Family Communication: per cardiac surgery. Disposition: ICU   CRITICAL CARE Performed by: Kipp Brood   Total critical care time: 35 minutes  Critical care time was exclusive of separately billable procedures and treating other patients.  Critical care was  necessary to treat or prevent imminent or life-threatening deterioration.  Critical care was time spent personally by me on the following activities: development of treatment plan with patient and/or surrogate as well as nursing, discussions  with consultants, evaluation of patient's response to treatment, examination of patient, obtaining history from patient or surrogate, ordering and performing treatments and interventions, ordering and review of laboratory studies, ordering and review of radiographic studies, pulse oximetry, re-evaluation of patient's condition and participation in multidisciplinary rounds.  Kipp Brood, MD [facility] ICU Physician [place]  Pager: [phone] Mobile: [phone] After hours: [phone].    08/29/2019, 7:20 AM

## 2019-08-29 NOTE — Anesthesia Procedure Notes (Signed)
Performed by: Teressa Lower., CRNA Comments: Previously intubated from ICU

## 2019-08-29 NOTE — Progress Notes (Signed)
      [address]       [place] [address]             [phone]      S/p wash out, chest not able to be closed  BP (!) 137/110   Pulse 72   Temp 98.8 F (37.1 C)   Resp 20   Ht 5\' 9"  (1.753 m)   Wt 93.8 kg   SpO2 100%   BMI 30.54 kg/m   Intake/Output Summary (Last 24 hours) at 08/29/2019 1900 Last data filed at 08/29/2019 1745 Gross per 24 hour  Intake 2294.69 ml  Output 2024 ml  Net 270.69 ml   Resume tube feeds  Remo Lipps C. Roxan Hockey, MD [facility] [phone]

## 2019-08-29 NOTE — Progress Notes (Signed)
ANTICOAGULATION CONSULT NOTE - Follow Up Consult  Pharmacy Consult for heparin Indication: pulmonary embolus  Labs: Recent Labs    08/27/19 0328 08/27/19 1611 08/27/19 2335 08/28/19 0424 08/28/19 0849 08/28/19 0849 08/28/19 1631 08/29/19 0323 08/29/19 0635  HGB   < >  --   --   --  8.8*   < >  --  7.7* 7.7*  HCT   < >  --   --   --  28.5*  --   --  24.8* 24.8*  PLT   < >  --   --   --  249  --   --  224 229  HEPARINUNFRC  --   --  0.17*  --  0.23*  --   --  0.19*  --   CREATININE  --    < >  --  2.43*  --   --  2.46* 2.46*  --    < > = values in this interval not displayed.    Assessment: 74yo male subtherapeutic on heparin. Patient has been therapeutic on lower goal; no gtt issues or signs of bleeding per RN.  Hemoglobin down to 7.7. Plan for trach and chest closure later this week. Heparin level just below goal, will leave at current rate for now.   Goal of Therapy:  Heparin level 0.2-0.3 units/ml   Plan:  Heparin continues at 850 units/hr -lower goal due to open chest Daily heparin level for now  Erin Hearing PharmD., BCPS Clinical Pharmacist 08/29/2019 8:01 AM

## 2019-08-30 ENCOUNTER — Inpatient Hospital Stay (HOSPITAL_COMMUNITY): Payer: Medicare Other

## 2019-08-30 DIAGNOSIS — I214 Non-ST elevation (NSTEMI) myocardial infarction: Secondary | ICD-10-CM | POA: Diagnosis not present

## 2019-08-30 DIAGNOSIS — I5023 Acute on chronic systolic (congestive) heart failure: Secondary | ICD-10-CM | POA: Diagnosis not present

## 2019-08-30 DIAGNOSIS — N179 Acute kidney failure, unspecified: Secondary | ICD-10-CM | POA: Diagnosis not present

## 2019-08-30 DIAGNOSIS — R570 Cardiogenic shock: Secondary | ICD-10-CM | POA: Diagnosis not present

## 2019-08-30 LAB — GLUCOSE, CAPILLARY
Glucose-Capillary: 121 mg/dL — ABNORMAL HIGH (ref 70–99)
Glucose-Capillary: 135 mg/dL — ABNORMAL HIGH (ref 70–99)
Glucose-Capillary: 138 mg/dL — ABNORMAL HIGH (ref 70–99)
Glucose-Capillary: 139 mg/dL — ABNORMAL HIGH (ref 70–99)
Glucose-Capillary: 139 mg/dL — ABNORMAL HIGH (ref 70–99)
Glucose-Capillary: 148 mg/dL — ABNORMAL HIGH (ref 70–99)
Glucose-Capillary: 156 mg/dL — ABNORMAL HIGH (ref 70–99)

## 2019-08-30 LAB — MAGNESIUM: Magnesium: 2.7 mg/dL — ABNORMAL HIGH (ref 1.7–2.4)

## 2019-08-30 LAB — RENAL FUNCTION PANEL
Albumin: 2.3 g/dL — ABNORMAL LOW (ref 3.5–5.0)
Albumin: 2.2 g/dL — ABNORMAL LOW (ref 3.5–5.0)
Anion gap: 13 (ref 5–15)
Anion gap: 14 (ref 5–15)
BUN: 47 mg/dL — ABNORMAL HIGH (ref 8–23)
BUN: 46 mg/dL — ABNORMAL HIGH (ref 8–23)
CO2: 21 mmol/L — ABNORMAL LOW (ref 22–32)
CO2: 21 mmol/L — ABNORMAL LOW (ref 22–32)
Calcium: 8.7 mg/dL — ABNORMAL LOW (ref 8.9–10.3)
Calcium: 8.5 mg/dL — ABNORMAL LOW (ref 8.9–10.3)
Chloride: 102 mmol/L (ref 98–111)
Chloride: 101 mmol/L (ref 98–111)
Creatinine, Ser: 2.33 mg/dL — ABNORMAL HIGH (ref 0.61–1.24)
Creatinine, Ser: 2.29 mg/dL — ABNORMAL HIGH (ref 0.61–1.24)
GFR calc Af Amer: 31 mL/min — ABNORMAL LOW (ref >60)
GFR calc Af Amer: 32 mL/min — ABNORMAL LOW (ref >60)
GFR calc non Af Amer: 27 mL/min — ABNORMAL LOW (ref >60)
GFR calc non Af Amer: 27 mL/min — ABNORMAL LOW (ref >60)
Glucose, Bld: 135 mg/dL — ABNORMAL HIGH (ref 70–99)
Glucose, Bld: 154 mg/dL — ABNORMAL HIGH (ref 70–99)
Phosphorus: 3.7 mg/dL (ref 2.5–4.6)
Phosphorus: 2.8 mg/dL (ref 2.5–4.6)
Potassium: 4.4 mmol/L (ref 3.5–5.1)
Potassium: 3.9 mmol/L (ref 3.5–5.1)
Sodium: 136 mmol/L (ref 135–145)
Sodium: 136 mmol/L (ref 135–145)

## 2019-08-30 LAB — COOXEMETRY PANEL
Carboxyhemoglobin: 0.8 % (ref 0.5–1.5)
Carboxyhemoglobin: 0.7 % (ref 0.5–1.5)
Methemoglobin: 0.8 % (ref 0.0–1.5)
Methemoglobin: 0.8 % (ref 0.0–1.5)
O2 Saturation: 54.1 %
O2 Saturation: 70.4 %
Total hemoglobin: 9.8 g/dL — ABNORMAL LOW (ref 12.0–16.0)
Total hemoglobin: 15 g/dL (ref 12.0–16.0)

## 2019-08-30 LAB — CBC WITH DIFFERENTIAL/PLATELET
Abs Immature Granulocytes: 0.66 10*3/uL — ABNORMAL HIGH (ref 0.00–0.07)
Basophils Absolute: 0.1 10*3/uL (ref 0.0–0.1)
Basophils Relative: 0 %
Eosinophils Absolute: 0 10*3/uL (ref 0.0–0.5)
Eosinophils Relative: 0 %
HCT: 24.3 % — ABNORMAL LOW (ref 39.0–52.0)
Hemoglobin: 7.8 g/dL — ABNORMAL LOW (ref 13.0–17.0)
Immature Granulocytes: 3 %
Lymphocytes Relative: 5 %
Lymphs Abs: 1 10*3/uL (ref 0.7–4.0)
MCH: 30.6 pg (ref 26.0–34.0)
MCHC: 32.1 g/dL (ref 30.0–36.0)
MCV: 95.3 fL (ref 80.0–100.0)
Monocytes Absolute: 1.1 10*3/uL — ABNORMAL HIGH (ref 0.1–1.0)
Monocytes Relative: 5 %
Neutro Abs: 17.1 10*3/uL — ABNORMAL HIGH (ref 1.7–7.7)
Neutrophils Relative %: 87 %
Platelets: 247 10*3/uL (ref 150–400)
RBC: 2.55 MIL/uL — ABNORMAL LOW (ref 4.22–5.81)
RDW: 17.5 % — ABNORMAL HIGH (ref 11.5–15.5)
WBC: 19.9 10*3/uL — ABNORMAL HIGH (ref 4.0–10.5)
nRBC: 0.2 % (ref 0.0–0.2)

## 2019-08-30 LAB — VANCOMYCIN, TROUGH: Vancomycin Tr: 30 ug/mL (ref 15–20)

## 2019-08-30 LAB — HEPARIN LEVEL (UNFRACTIONATED): Heparin Unfractionated: 0.2 IU/mL — ABNORMAL LOW (ref 0.30–0.70)

## 2019-08-30 MED ORDER — AMIODARONE HCL 200 MG PO TABS
200.0000 mg | ORAL_TABLET | Freq: Two times a day (BID) | ORAL | Status: DC
  Administered 2019-08-30 – 2019-09-28 (×59): 200 mg
  Filled 2019-08-30 (×60): qty 1

## 2019-08-30 MED ORDER — MIDODRINE HCL 5 MG PO TABS
5.0000 mg | ORAL_TABLET | Freq: Three times a day (TID) | ORAL | Status: DC
  Administered 2019-08-30: 5 mg via ORAL
  Filled 2019-08-30: qty 1

## 2019-08-30 MED ORDER — VANCOMYCIN HCL IN DEXTROSE 1-5 GM/200ML-% IV SOLN
1000.0000 mg | INTRAVENOUS | Status: DC
  Administered 2019-08-31: 1000 mg via INTRAVENOUS
  Filled 2019-08-30: qty 200

## 2019-08-30 NOTE — Progress Notes (Signed)
Patient ID: Christian [name], male   DOB: 1946-02-12, 74 y.o.   MRN: [id number] S: Was hypotensive last night and placed on levophed but off this am.  S/p trach and chest wash out yesterday.  Not able to close chest due to persistent edema. O:BP (!) 135/57   Pulse 95   Temp 98.2 F (36.8 C)   Resp (!) 22   Ht _0  (1.753 m)   Wt 91.9 kg   SpO2 99%   BMI 29.92 kg/m   Intake/Output Summary (Last 24 hours) at 08/30/2019 0909 Last data filed at 08/30/2019 0900 Gross per 24 hour  Intake 2033.78 ml  Output 2754 ml  Net -720.22 ml   Intake/Output: I/O last 3 completed shifts: In: 3487 [I.V.:1536.5; NG/GT:1140; IV Piggyback:810.5] Out: 9924 [Emesis/NG output:1000; Drains:250; QASTM:1962; Blood:10; Chest Tube:80]  Intake/Output this shift:  Total I/O In: 65.1 [I.V.:25.1; NG/GT:40] Out: 78 [Other:78] Weight change: -1.9 kg Gen: awake on vent via trach CVS: RRR, no rub Resp: occ rhonchi Abd: Distended, +BS, soft Ext: 1+ edema of arms and dependent areas.  Recent Labs  Lab 08/24/19 0405 08/25/19 0326 08/27/19 0328 08/27/19 1611 08/28/19 0424 08/28/19 1631 08/29/19 0323 08/29/19 1816 08/30/19 0322  NA 140   < > 135 137 137 138 136 138 136  K 4.2   < > 5.1 5.2* 4.7 4.5 4.0 4.0 4.4  CL 102   < > 102 101 101 102 100 105 102  CO2 22   < > _1 21* 21*  GLUCOSE 204*   < > 206* 134* 112* 131* 158* 131* 135*  BUN 114*   < > 58* 46* 44* 44* 48* 45* 47*  CREATININE 5.27*   < > 2.40* 2.31* 2.43* 2.46* 2.46* 2.68* 2.33*  ALBUMIN 1.6*   < > 2.0* 2.2* 2.1* 2.2* 2.3* 2.2* 2.3*  CALCIUM 8.2*   < > 8.6* 9.0 8.8* 8.9 8.7* 8.3* 8.7*  PHOS  --    < > 3.9 4.0 3.9 3.8 3.4 4.1 3.7  AST 59*  --   --   --   --   --   --   --   --   ALT 169*  --   --   --   --   --   --   --   --    < > = values in this interval not displayed.   Liver Function Tests: Recent Labs  Lab 08/24/19 0405 08/25/19 0326 08/29/19 0323 08/29/19 1816 08/30/19 0322  AST 59*  --   --   --   --   ALT 169*  --    --   --   --   ALKPHOS 115  --   --   --   --   BILITOT 0.7  --   --   --   --   PROT 4.3*  --   --   --   --   ALBUMIN 1.6*   < > 2.3* 2.2* 2.3*   < > = values in this interval not displayed.   No results for input(s): LIPASE, AMYLASE in the last 168 hours. No results for input(s): AMMONIA in the last 168 hours. CBC: Recent Labs  Lab 08/27/19 0328 08/27/19 0328 08/28/19 0849 08/28/19 0849 08/29/19 0323 08/29/19 0635 08/30/19 0322  WBC 16.9*   < > 17.4*   < > 18.0* 19.1* 19.9*  NEUTROABS  --   --   --   --  14.9*  --  17.1*  HGB 8.6*   < > 8.8*   < > 7.7* 7.7* 7.8*  HCT 27.4*   < > 28.5*   < > 24.8* 24.8* 24.3*  MCV 95.1  --  96.0  --  96.1 96.1 95.3  PLT 236   < > 249   < > 224 229 247   < > = values in this interval not displayed.   Cardiac Enzymes: No results for input(s): CKTOTAL, CKMB, CKMBINDEX, TROPONINI in the last 168 hours. CBG: Recent Labs  Lab 08/29/19 1224 08/29/19 1954 08/30/19 0002 08/30/19 0429 08/30/19 0745  GLUCAP 95 135* 148* 139* 156*    Iron Studies: No results for input(s): IRON, TIBC, TRANSFERRIN, FERRITIN in the last 72 hours. Studies/Results: DG CHEST PORT 1 VIEW  Result Date: 08/29/2019 CLINICAL DATA:  ET tube and chest tube. Status post CABG EXAM: PORTABLE CHEST 1 VIEW COMPARISON:  08/29/2019 FINDINGS: ET tube tip is above the carina. The right IJ catheter sheath projects over the SVC. Right subclavian central venous catheter tip is at the cavoatrial junction. There is a feeding tube and nasogastric tube in place. Bilateral chest tubes in place without evidence for pneumothorax. Cardiac enlargement. No pleural effusion. No airspace densities identified. IMPRESSION: 1. Satisfactory position of support apparatus. 2. No pneumothorax. 3. Cardiac enlargement. Electronically Signed   By: Kerby Moors M.D.   On: 08/29/2019 09:11   DG Abd Portable 1V  Result Date: 08/28/2019 CLINICAL DATA:  Feeding tube placement. EXAM: PORTABLE ABDOMEN - 1 VIEW  COMPARISON:  Radiographs 08/27/2019 FINDINGS: 1210 hours. The feeding tube has been advanced, and now follows a course consistent with tip in the 3rd portion of the duodenum. A nasogastric tube extends to the distal stomach. Left chest tube and mediastinal drains are in place. Mild gaseous distension of the bowel, a small left pleural effusion and left basilar atelectasis appear unchanged. IMPRESSION: Feeding tube tip projects to the level of the 3rd portion of the duodenum. Electronically Signed   By: Richardean Sale M.D.   On: 08/28/2019 12:17   . sodium chloride   Intravenous Once  . sodium chloride   Intravenous Once  . amiodarone  200 mg Per Tube BID  . aspirin  81 mg Per Tube Daily  . atorvastatin  40 mg Per Tube Daily  . B-complex with vitamin C  1 tablet Per Tube Daily  . bisacodyl  10 mg Oral Daily   Or  . bisacodyl  10 mg Rectal Daily  . chlorhexidine gluconate (MEDLINE KIT)  15 mL Mouth Rinse BID  . Chlorhexidine Gluconate Cloth  6 each Topical Daily  . clopidogrel  75 mg Per Tube Daily  . docusate  100 mg Per Tube BID  . feeding supplement (PRO-STAT SUGAR FREE 64)  60 mL Per Tube BID  . insulin aspart  0-24 Units Subcutaneous Q4H  . lactulose  30 g Per Tube Daily  . mouth rinse  15 mL Mouth Rinse 10 times per day  . metoCLOPramide (REGLAN) injection  5 mg Intravenous Q8H  . pantoprazole sodium  40 mg Per Tube Daily  . sodium chloride flush  10-40 mL Intracatheter Q12H  . sodium chloride flush  3 mL Intravenous Q12H  . trospium  20 mg Per Tube BID    BMET    Component Value Date/Time   NA 136 08/30/2019 0322   NA 141 06/19/2019 1130   K 4.4 08/30/2019 0322   CL 102 08/30/2019 0322  CO2 21 (L) 08/30/2019 0322   GLUCOSE 135 (H) 08/30/2019 0322   BUN 47 (H) 08/30/2019 0322   BUN 21 06/19/2019 1130   CREATININE 2.33 (H) 08/30/2019 0322   CALCIUM 8.7 (L) 08/30/2019 0322   GFRNONAA 27 (L) 08/30/2019 0322   GFRAA 31 (L) 08/30/2019 0322   CBC    Component Value  Date/Time   WBC 19.9 (H) 08/30/2019 0322   RBC 2.55 (L) 08/30/2019 0322   HGB 7.8 (L) 08/30/2019 0322   HCT 24.3 (L) 08/30/2019 0322   PLT 247 08/30/2019 0322   MCV 95.3 08/30/2019 0322   MCH 30.6 08/30/2019 0322   MCHC 32.1 08/30/2019 0322   RDW 17.5 (H) 08/30/2019 0322   LYMPHSABS 1.0 08/30/2019 0322   MONOABS 1.1 (H) 08/30/2019 0322   EOSABS 0.0 08/30/2019 0322   BASOSABS 0.1 08/30/2019 0322    Assessment/Plan:  1. AKI- following cardiac cath, cabg, cardiogenic shock, cardiac arrest due to tamponade, and acute on chronic CHF. Started CRRT on 08/24/19 due to anasarca and need for volume removal to close his open chest. 1. Continue with CRRT 2. Volume improved, will increase UF goal to 75 ml/hr during the day and will keep even during the nights to hopefully prevent his evening drops in BP.  CVP of 10 this am following surgery.   3. CAD s/p CABG x 4 complicated by cardiac tamponade s/p reopened chest urgently on 08/18/19 with wound vac. 1. Hopefully will be able to close chest wound soon but need to cont with UF to help with edema of chest. 2. Acute systolic heart failure due to ischemic cardiomyopathy- volume markedly improved with CRRT. 3. Postoperative acute hypoxic respiratory failure- reintubated 08/18/19 and likely aspiration. 4. Anemia- stable and transfuse prn.  5. Ileus- had BM last night but still distended and tense. Per primary.  Donetta Potts, MD [employer] [phone]

## 2019-08-30 NOTE — Progress Notes (Signed)
NAME:  Christian [name], MRN:  [id number], DOB:  [birth year], LOS: 21 ADMISSION DATE:  08/09/2019, CONSULTATION DATE:  08/28/2019 REFERRING MD:  Orvan Seen - TRH, CHIEF COMPLAINT:  Respiratory failure.    HPI/course in hospital   74 year old man who presented with progressive dyspnea.  He was admitted 4/14 and was incidentally found to have a right lower lobe pulmonary embolism.  Overall the picture was that of decompensated heart failure with a non-STEMI.  Coronary angiography revealed triple-vessel disease and the patient was referred for cardiac surgery.  EF was found to be reduced at that time at  He underwent four-vessel CABG 4/20 with good quality distal targets.  Cardiac function remained reduced post bypass.  Unable to close chest due to surrounding edema.  Underwent attempted closure with VAC placement 4/22.   Cardiac arrest 4/23 with repeated washouts with VAC placements on 4/26 and 4/29.  Significant volume overload postoperatively.  Acute kidney injury.  Currently on CRRT for fluid removal.   Past Medical History   Past Medical History:  Diagnosis Date  . BPH (benign prostatic hypertrophy)   . Coronary artery disease   . Diabetes mellitus without complication ([place])   . Dry eyes left  . Hiatal hernia   . 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  LIMA TO LAD, RIMA TO PDA, RADIAL ARTERY TO CIRC AND SEQUENTIALLY TO OM1. 08/15/2019  . Hyperlipidemia   . Hypertension   . Incomplete bladder emptying   . Nocturia   . 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 SOB (shortness of breath) on exertion      Past Surgical History:  Procedure Laterality Date  . APPLICATION OF WOUND VAC N/A 08/24/2019   Procedure: APPLICATION OF WOUND VAC;  Surgeon: [name], MD;  Location: [facility];  Service: Thoracic;  Laterality: N/A;  . APPLICATION OF WOUND VAC   08/29/2019   Procedure: Wound Vac change;  Surgeon: [name], MD;  Location: [facility];  Service: Open Heart Surgery;;  . CARDIAC CATHETERIZATION    . CORONARY ARTERY BYPASS GRAFT N/A 08/15/2019   Procedure: CORONARY ARTERY BYPASS GRAFTING (CABG), x 4 using bilateral IMAs and left radial artery .  LIMA TO LAD, RIMA TO PDA, RADIAL ARTERY TO CIRC AND SEQUENTIALLY TO OM1.;  Surgeon: [name], MD;  Location: [place];  Service: Open Heart Surgery;  Laterality: N/A;  . CORONARY STENT PLACEMENT  02/27/2014   distal rt/pd coronary       dr Einar Gip  . CYSTO/ BLADDER BIOPSY'S/ CAUTHERIZATION  01-14-2004  DR Gaynelle Arabian  . EXPLORATION POST OPERATIVE OPEN HEART N/A 08/16/2019   Procedure: Chest Closure S?P CABG WITH APPLICATION OF PREVENA  INCISIONAL WOUND VAC;  Surgeon: [name], MD;  Location: [facility];  Service: Open Heart Surgery;  Laterality: N/A;  . EXPLORATION POST OPERATIVE OPEN HEART N/A 08/21/2019   Procedure: CHEST WASHOUT S/P OPEN CHEST;  Surgeon: [name], MD;  Location: [place];  Service: Open Heart Surgery;  Laterality: N/A;  Open chest with Esmark dressing with Ioban sealant coverage.  . EXPLORATION POST OPERATIVE OPEN HEART N/A 08/18/2019   Procedure: EXPLORATION POST OPERATIVE OPEN HEART (performed 04/23 on unit);  Surgeon: [name], MD;  Location: [place];  Service: Open Heart Surgery;  Laterality: N/A;  . EXPLORATION POST OPERATIVE OPEN  HEART N/A 08/24/2019   Procedure: CHEST WASHOUT POST OPERATIVE OPEN HEART;  Surgeon: [name], MD;  Location: [place];  Service: Open Heart Surgery;  Laterality: N/A;  . EXPLORATION POST OPERATIVE OPEN HEART N/A 08/29/2019   Procedure: CHEST WOUND WASHOUT POST OPERATIVE OPEN HEART;  Surgeon: [name], MD;  Location: [place];  Service: Open Heart Surgery;  Laterality: N/A;  . LEFT HEART CATH AND CORONARY ANGIOGRAPHY N/A 08/10/2019   Procedure: LEFT HEART CATH AND CORONARY ANGIOGRAPHY;  Surgeon: Nigel Mormon, MD;   Location: [place] [facility];  Service: Cardiovascular;  Laterality: N/A;  . LEFT HEART CATHETERIZATION WITH CORONARY ANGIOGRAM N/A 02/27/2014   Procedure: LEFT HEART CATHETERIZATION WITH CORONARY ANGIOGRAM;  Surgeon: Laverda Page, MD;  Location: [facility] CATH LAB;  Service: Cardiovascular;  Laterality: N/A;  . PERCUTANEOUS CORONARY STENT INTERVENTION (PCI-S)  02/27/2014   Procedure: PERCUTANEOUS CORONARY STENT INTERVENTION (PCI-S);  Surgeon: Laverda Page, MD;  Location: [facility] CATH LAB;  Service: Cardiovascular;;  rt PDA  3.0/28mm Promus stent  . RADIAL ARTERY HARVEST Left 08/15/2019   Procedure: Radial Artery Harvest;  Surgeon: [name], MD;  Location: [place];  Service: Open Heart Surgery;  Laterality: Left;  . TEE WITHOUT CARDIOVERSION N/A 08/15/2019   Procedure: TRANSESOPHAGEAL ECHOCARDIOGRAM (TEE);  Surgeon: [name], MD;  Location: [place];  Service: Open Heart Surgery;  Laterality: N/A;  . TRACHEOSTOMY TUBE PLACEMENT  08/29/2019   Procedure: Tracheostomy;  Surgeon: [name], MD;  Location: [place] OR;  Service: Open Heart Surgery;;  . TRANSURETHRAL RESECTION OF PROSTATE  04/04/2012   Procedure: TRANSURETHRAL RESECTION OF THE PROSTATE WITH GYRUS INSTRUMENTS;  Surgeon: Ailene Rud, MD;  Location: [place];  Service: Urology;  Laterality: N/A;  . TRANSURETHRAL RESECTION OF PROSTATE N/A 09/27/2014   Procedure: TRANSURETHRAL RESECTION OF THE PROSTATE ;  Surgeon: Carolan Clines, MD;  Location: [facility];  Service: Urology;  Laterality: N/A;  . UPPER GASTROINTESTINAL ENDOSCOPY       Review of Systems:   Review of Systems  Unable to perform ROS: Critical illness    Social History   reports that he quit smoking about 29 years ago. His smoking use included cigarettes. He has a 10.00 pack-year smoking history. He has never used smokeless tobacco. He reports current alcohol use. He reports that he does not use drugs.   Family History   His family history  includes Diabetes in his brother, brother, father, and mother; Hypertension in his brother and mother.   Allergies No Known Allergies   Home Medications  Prior to Admission medications   Medication Sig Start Date End Date Taking? Authorizing Provider  aspirin EC 81 MG tablet Take 81 mg by mouth daily.   Yes [provider]  atorvastatin (LIPITOR) 20 MG tablet TAKE 1 TABLET BY MOUTH EVERY DAY IN THE EVENING Patient taking differently: Take 20 mg by mouth every evening.  06/22/19  Yes Adrian Prows, MD  cloNIDine (CATAPRES) 0.2 MG tablet TAKE 1 TABLET (0.2 MG TOTAL) BY MOUTH DAILY. 06/22/19  Yes Adrian Prows, MD  cromolyn (OPTICROM) 4 % ophthalmic solution Place 1 drop into both eyes 4 (four) times daily as needed (dry eyes).  02/02/17  Yes [provider]  empagliflozin (JARDIANCE) 10 MG TABS tablet Take 10 mg by mouth daily before breakfast. 07/25/19  Yes Marrian Salvage, FNP  furosemide (LASIX) 20 MG tablet TAKE 1 TABLET BY MOUTH EVERY DAY Patient taking differently: Take 20  mg by mouth daily.  08/01/19  Yes Adrian Prows, MD  isosorbide mononitrate (IMDUR) 60 MG 24 hr tablet Take 60 mg by mouth daily.   Yes [provider]  metFORMIN (GLUCOPHAGE) 1000 MG tablet TAKE 1 TABLET TWICE A DAY WITH A MEAL Patient taking differently: Take 1,000 mg by mouth 2 (two) times daily with a meal.  07/31/19  Yes Hoyt Koch, MD  metoprolol succinate (TOPROL-XL) 100 MG 24 hr tablet Take 1 tablet (100 mg total) by mouth daily. Take with or immediately following a meal. 07/24/19  Yes Adrian Prows, MD  nitroGLYCERIN (NITROSTAT) 0.4 MG SL tablet Place 1 tablet (0.4 mg total) under the tongue every 5 (five) minutes as needed for chest pain. 06/01/19  Yes Adrian Prows, MD  olmesartan-hydrochlorothiazide (BENICAR HCT) 40-25 MG tablet TAKE 1 TABLET BY MOUTH EVERY DAY Patient taking differently: Take 1 tablet by mouth daily.  07/03/19  Yes Adrian Prows, MD  PREVIDENT 5000 BOOSTER PLUS 1.1 % PSTE Take  1 application by mouth daily.  06/14/18  Yes [provider]  silodosin (RAPAFLO) 8 MG CAPS capsule Take 8 mg by mouth at bedtime. 07/27/19  Yes [provider]  spironolactone (ALDACTONE) 25 MG tablet Take 1 tablet (25 mg total) by mouth daily. 06/01/19 08/30/19 Yes Adrian Prows, MD  sucralfate (CARAFATE) 1 GM/10ML suspension Take 10 mLs (1 g total) by mouth 4 (four) times daily -  with meals and at bedtime. 07/24/19  Yes Marrian Salvage, FNP  tamsulosin [facility]) 0.4 MG CAPS capsule Take 0.4 mg by mouth daily after breakfast.  05/20/18  Yes [provider]  Trospium Chloride 60 MG CP24 Take 1 capsule by mouth daily. 07/31/19  Yes [provider]  verapamil (VERELAN PM) 240 MG 24 hr capsule TAKE 1 CAPSULE BY MOUTH EVERY DAY Patient taking differently: Take 240 mg by mouth daily.  07/24/19  Yes Adrian Prows, MD     Interim history/subjective:  Tracheostomy placed yesterday. Unable to close chest due to edema.   Objective   Blood pressure (!) 126/43, pulse 100, temperature 98.8 F (37.1 C), resp. rate (!) 28, height 5\' 9"  (1.753 m), weight 91.9 kg, SpO2 98 %. CVP:  [0 mmHg-13 mmHg] 10 mmHg  Vent Mode: PSV;CPAP FiO2 (%):  [30 %-35 %] 35 % Set Rate:  [18 bmp] 18 bmp Vt Set:  [560 mL] 560 mL PEEP:  [5 cmH20] 5 cmH20 Pressure Support:  [5 cmH20] 5 cmH20 Plateau Pressure:  [22 cmH20-24 cmH20] 24 cmH20   Intake/Output Summary (Last 24 hours) at 08/30/2019 1247 Last data filed at 08/30/2019 1200 Gross per 24 hour  Intake 2207.09 ml  Output 3036 ml  Net -828.91 ml   Filed Weights   08/28/19 0600 08/29/19 0620 08/30/19 0600  Weight: 96.3 kg 93.8 kg 91.9 kg    Examination: Physical Exam Constitutional:      Appearance: He is obese.     Interventions: He is intubated.     Comments: On ventilator, off sedation.   HENT:     Nose:     Comments: Small bore feeding tube in place.     Mouth/Throat:     Comments: ETT tube in place with no skin breakdown Eyes:      General: No scleral icterus.    Extraocular Movements: Extraocular movements intact.     Conjunctiva/sclera: Conjunctivae normal.  Cardiovascular:     Rate and Rhythm: Normal rate and regular rhythm.     Arteriovenous access: right arteriovenous  access is present.    Comments: Distant heart sounds.  Right IJ introducer and [place] HD catheter. Pulmonary:     Effort: Pulmonary effort is normal. He is intubated.     Comments: Tolerating PSV Chest:     Comments: Mild VAC dressing in place Abdominal:     General: Bowel sounds are absent. There is distension.     Palpations: Abdomen is soft.  Genitourinary:    Comments: Condom catheter Musculoskeletal:     Right lower leg: 2+ Pitting Edema present.     Left lower leg: 2+ Pitting Edema present.  Skin:    General: Skin is warm.  Neurological:     Mental Status: He is alert.     GCS: GCS eye subscore is 4. GCS verbal subscore is 5. GCS motor subscore is 6.     Motor: Weakness present. No tremor.     Comments: Able to stick tongue now able to move limbs weakly.       Ancillary tests (personally reviewed)  CBC: Recent Labs  Lab 08/27/19 0328 08/28/19 0849 08/29/19 0323 08/29/19 0635 08/30/19 0322  WBC 16.9* 17.4* 18.0* 19.1* 19.9*  NEUTROABS  --   --  14.9*  --  17.1*  HGB 8.6* 8.8* 7.7* 7.7* 7.8*  HCT 27.4* 28.5* 24.8* 24.8* 24.3*  MCV 95.1 96.0 96.1 96.1 95.3  PLT 236 249 224 229 956    Basic Metabolic Panel: Recent Labs  Lab 08/26/19 0339 08/26/19 1546 08/27/19 0328 08/27/19 1611 08/28/19 0424 08/28/19 1631 08/29/19 0323 08/29/19 1816 08/30/19 0322  NA 137   < > 135   < > 137 138 136 138 136  K 4.5   < > 5.1   < > 4.7 4.5 4.0 4.0 4.4  CL 103   < > 102   < > 101 102 100 105 102  CO2 23   < > 23   < > 23 23 23  21* 21*  GLUCOSE 196*   < > 206*   < > 112* 131* 158* 131* 135*  BUN 64*   < > 58*   < > 44* 44* 48* 45* 47*  CREATININE 2.52*   < > 2.40*   < > 2.43* 2.46* 2.46* 2.68* 2.33*  CALCIUM 8.3*   < > 8.6*   < >  8.8* 8.9 8.7* 8.3* 8.7*  MG 2.5*  --  2.6*  --  2.9*  --  2.8*  --  2.7*  PHOS 4.2   < > 3.9   < > 3.9 3.8 3.4 4.1 3.7   < > = values in this interval not displayed.   GFR: Estimated Creatinine Clearance: 32.1 mL/min (A) (by C-G formula based on SCr of 2.33 mg/dL (H)). Recent Labs  Lab 08/28/19 0849 08/29/19 0323 08/29/19 0635 08/30/19 0322  WBC 17.4* 18.0* 19.1* 19.9*    Liver Function Tests: Recent Labs  Lab 08/24/19 0405 08/25/19 0326 08/28/19 0424 08/28/19 1631 08/29/19 0323 08/29/19 1816 08/30/19 0322  AST 59*  --   --   --   --   --   --   ALT 169*  --   --   --   --   --   --   ALKPHOS 115  --   --   --   --   --   --   BILITOT 0.7  --   --   --   --   --   --   PROT 4.3*  --   --   --   --   --   --  ALBUMIN 1.6*   < > 2.1* 2.2* 2.3* 2.2* 2.3*   < > = values in this interval not displayed.   No results for input(s): LIPASE, AMYLASE in the last 168 hours. No results for input(s): AMMONIA in the last 168 hours.  ABG    Component Value Date/Time   PHART 7.513 (H) 08/20/2019 0404   PCO2ART 34.7 08/20/2019 0404   PO2ART 216 (H) 08/20/2019 0404   HCO3 27.9 08/20/2019 0404   TCO2 29 08/20/2019 0404   ACIDBASEDEF 5.0 (H) 08/18/2019 2341   O2SAT 70.4 08/30/2019 0449     Coagulation Profile: No results for input(s): INR, PROTIME in the last 168 hours.  Cardiac Enzymes: No results for input(s): CKTOTAL, CKMB, CKMBINDEX, TROPONINI in the last 168 hours.  HbA1C: Hgb A1c MFr Bld  Date/Time Value Ref Range Status  08/14/2019 07:45 PM 7.7 (H) 4.8 - 5.6 % Final    Comment:    (NOTE) Pre diabetes:          5.7%-6.4% Diabetes:              >6.4% Glycemic control for   <7.0% adults with diabetes   07/24/2019 03:12 PM 7.7 (H) 4.6 - 6.5 % Final    Comment:    Glycemic Control Guidelines for People with Diabetes:Non Diabetic:  <6%Goal of Therapy: <7%Additional Action Suggested:  >8%     CBG: Recent Labs  Lab 08/29/19 1954 08/30/19 0002 08/30/19 0429  08/30/19 0745 08/30/19 1230  GLUCAP 135* 148* 139* 156* 138*   CXR 08/27/19:  Small left effusion, retrocardiac consolidation. (personal interpretation)  Echo 4/24: EF 45% with Grade 2 DD.   Assessment & Plan:   Critically ill due to prolonged acute hypoxic hypercapnic respiratory failure requiring mechanical ventilation following cardiac surgery. While able to tolerate brief SBT, doubt will be able to protect airway if extubated. - Open-ended trach collar trial  Acute decompensated systolic and diastolic heart failure. Now off all vasoactive infusions. - Holding ISDN and HDLZ to allow for aggressive fluid removal  - will add midodrine to facilitate fluid removal   Critically ill due to acute kidney injury requiring CRRT to control volume overload and electrolyte imbalances. - Continue to UF to remove edema with primary goal to permit definitive chest closure.   Status post CABG with multiple chest explorations and VAC dressing still in place - For eventual closure at a latter day. .  Generalized frailty.  Weakness of critical illness. [place] Suspect much of the fluid is now extravascular due to cachexia and immobility - Limit sedatives to promote interactivity and help regain function.  Gastroparesis of acute illness, narcotic use.  - Clamp OGT post OR today.   Daily Goals Checklist  Pain/Anxiety/Delirium protocol (if indicated): intermittent dilaudid only.  VAP protocol (if indicated): bundle in place.  Respiratory support goals: daily SBT Blood pressure target: Keep MAP >65. Start midodrine and use phenylephrine to support BP as fluid removal is the priority. DVT prophylaxis: on IV heparin Nutritional status and feeding goals: high nutritional risk. Move tube into small bowel. Assess nutritional adequacy, target 2g/kg/d protein intake. GI prophylaxis: Pantoprazole Fluid status goals: continue to UF at -75/h, minimize fluids Urinary catheter: condom  Central lines: right  IJ and [place], arterial line Glucose control: currently euglycemic Mobility/therapy needs: will need aggressive therapy. Antibiotic de-escalation: prophylaxis for open chest.  Home medication reconciliation: on hold Daily labs: CBC, BMP Code Status: full  Family Communication: per cardiac surgery. Disposition: ICU   CRITICAL CARE  Performed by: Kipp Brood   Total critical care time: 35 minutes  Critical care time was exclusive of separately billable procedures and treating other patients.  Critical care was necessary to treat or prevent imminent or life-threatening deterioration.  Critical care was time spent personally by me on the following activities: development of treatment plan with patient and/or surrogate as well as nursing, discussions with consultants, evaluation of patient's response to treatment, examination of patient, obtaining history from patient or surrogate, ordering and performing treatments and interventions, ordering and review of laboratory studies, ordering and review of radiographic studies, pulse oximetry, re-evaluation of patient's condition and participation in multidisciplinary rounds.  Kipp Brood, MD [facility] ICU Physician [place]  Pager: [phone] Mobile: [phone] After hours: [phone].    08/30/2019, 12:47 PM

## 2019-08-30 NOTE — Op Note (Signed)
Procedure(s): CHEST WOUND WASHOUT POST OPERATIVE OPEN HEART Tracheostomy Wound Vac change Procedure Note  Christian [name] male 74 y.o. 08/30/2019  Procedure(s) and Anesthesia Type:    * CHEST WOUND WASHOUT POST OPERATIVE OPEN HEART - General    * Tracheostomy    * Wound Vac change  Surgeon(s) and Role:    [name], MD - Primary   Indications: The patient is s/p CABG with open chest and ventilator dependence. He is taken to the OR for sternal vac change and open tracheostomy.     Surgeon: [name]   Assistants: staff  Anesthesia: General endotracheal anesthesia  ASA Class: 4    Procedure Detail  CHEST WOUND WASHOUT POST OPERATIVE OPEN HEART, Tracheostomy, Wound Vac change  After informed consent, the patient was taken to the operating room at [facility] on the above date and placed in the supine position on the operating table.  Anesthesia was confirmed by the general endotracheal technique.  The existing trach was taken down to the anterior chest and the neck was cleansed and draped with Betadine solution.  A preop surgical pause was performed.  The mediastinum was irrigated with a Pulsavac irrigator.  A new VAC foam was cut to fit the shape of the defect and was then covered.  Next attention was turned to the tracheostomy where an incision was made proximal 1 fingerbreadth above the sternal notch.  Dissection was carried down through the platysma muscle with electrocautery.  The strap muscles were divided in the midline.  The second tracheal ring was encountered.  Incision was made in the space beneath the second tracheal ring at the existing endotracheal tube was seen to be withdrawn and a #8 Shiley was placed within the tracheotomy and affixed to the skin.  There was immediate return of CO2 into the ventilator circuit.  This concluded the procedure all sponge instrument needle counts were correct.  Estimated Blood Loss:  Minimal          Blood  Given: none          Specimens: no         Implants: none        Complications:  * No complications entered in OR log *         Disposition: ICU - intubated and critically ill.         Condition: stable

## 2019-08-30 NOTE — Brief Op Note (Signed)
08/29/2019  7:48 AM  PATIENT:  Christian [name]  74 y.o. male  PRE-OPERATIVE DIAGNOSIS:  Sternal Wound infection, respiratory failure  POST-OPERATIVE DIAGNOSIS:  Sternal Wound infection, respiratory failure  PROCEDURE:  Procedure(s): CHEST WOUND WASHOUT POST OPERATIVE OPEN HEART (N/A) Tracheostomy Wound Vac change  SURGEON:  Surgeon(s) and Role:    * [name], MD - Primary  PHYSICIAN ASSISTANT:   ASSISTANTS: staff   ANESTHESIA:   general  EBL:  10 mL   BLOOD ADMINISTERED:none  DRAINS: 4 chest tubes   LOCAL MEDICATIONS USED:  NONE  SPECIMEN:  No Specimen  DISPOSITION OF SPECIMEN:  N/A  COUNTS:  YES  TOURNIQUET:  * No tourniquets in log *  DICTATION: .Note written in EPIC  PLAN OF CARE: Admit to inpatient   PATIENT DISPOSITION:  ICU - intubated and critically ill.   Delay start of Pharmacological VTE agent (>24hrs) due to surgical blood loss or risk of bleeding: yes

## 2019-08-30 NOTE — Progress Notes (Signed)
Patients wife Pamala Hurry called and updated on patient and plans for the day.  She will be coming to visit at some point in the day.

## 2019-08-30 NOTE — Progress Notes (Signed)
Patient ID: Christian [name], male   DOB: 12/21/1945, 74 y.o.   MRN: [id number]     Advanced Heart Failure Rounding Note  PCP-Cardiologist: No primary care provider on file.   Subjective:    08/09/19 Admitted with NSTEM and ? Small RLL PE. Weight 213 pounds.  08/15/19 CABG x4 on 08/15/19 08/18/19 Cardiac arrest due to tamponade. Chest opened for washout 4/26 Chest washout in OR 4/29 CVVH started 5/5 OR for chest washout, tracheostomy  Vented with tracheostomy, FiO2 0.3.    Awake, following commands.    Briefly on phenylephrine overnight, now off pressors again.    Has AKI. Suspect ATN from cardiogenic shock/pericardial tamponade. CVVH initiated 4/29 to assist w/ volume removal. I/O mildly negative, weight down again.  CVP 10.  UF ongoing 50 cc/hr net.   On vanc/cefepime in setting of open chest. Afebrile. WBCs 20, hgb down to 7.8.   Echo 4/25: EF 40-45% with some residual pericardial clot.  Objective:   Weight Range: 91.9 kg Body mass index is 29.92 kg/m.   Vital Signs:   Temp:  [97.6 F (36.4 C)-99 F (37.2 C)] 98.2 F (36.8 C) (05/05 0748) Pulse Rate:  [52-131] 98 (05/05 0630) Resp:  [17-32] 23 (05/05 0630) BP: (79-137)/(42-110) 92/62 (05/05 0100) SpO2:  [98 %-100 %] 100 % (05/05 0630) Arterial Line BP: (81-209)/(35-80) 130/51 (05/05 0630) FiO2 (%):  [30 %] 30 % (05/05 0426) Weight:  [91.9 kg] 91.9 kg (05/05 0600) Last BM Date: 08/29/19  Weight change: Filed Weights   08/28/19 0600 08/29/19 0620 08/30/19 0600  Weight: 96.3 kg 93.8 kg 91.9 kg    Intake/Output:   Intake/Output Summary (Last 24 hours) at 08/30/2019 0758 Last data filed at 08/30/2019 0700 Gross per 24 hour  Intake 2020.87 ml  Output 2851 ml  Net -830.13 ml      Physical Exam   General: NAD Neck: No JVD, no thyromegaly or thyroid nodule.  Lungs: Clear to auscultation bilaterally with normal respiratory effort. CV: Nondisplaced PMI.  Chest open with wound vac. Heart regular S1/S2, no S3/S4, no  murmur.  1+ edema to knees.   Abdomen: Soft, nontender, no hepatosplenomegaly, no distention.  Skin: Intact without lesions or rashes.  Neurologic: Awake, follows commands  Extremities: No clubbing or cyanosis.  HEENT: Normal.    Telemetry   NSR 90s with PACs  (personally reviewed)  EKG    N/a   Labs    CBC Recent Labs    08/29/19 0323 08/29/19 0323 08/29/19 0635 08/30/19 0322  WBC 18.0*   < > 19.1* 19.9*  NEUTROABS 14.9*  --   --  17.1*  HGB 7.7*   < > 7.7* 7.8*  HCT 24.8*   < > 24.8* 24.3*  MCV 96.1   < > 96.1 95.3  PLT 224   < > 229 247   < > = values in this interval not displayed.   Basic Metabolic Panel Recent Labs    08/29/19 0323 08/29/19 0323 08/29/19 1816 08/30/19 0322  NA 136   < > 138 136  K 4.0   < > 4.0 4.4  CL 100   < > 105 102  CO2 23   < > 21* 21*  GLUCOSE 158*   < > 131* 135*  BUN 48*   < > 45* 47*  CREATININE 2.46*   < > 2.68* 2.33*  CALCIUM 8.7*   < > 8.3* 8.7*  MG 2.8*  --   --  2.7*  PHOS   3.4   < > 4.1 3.7   < > = values in this interval not displayed.   Liver Function Tests Recent Labs    08/29/19 1816 08/30/19 0322  ALBUMIN 2.2* 2.3*   No results for input(s): LIPASE, AMYLASE in the last 72 hours. Cardiac Enzymes No results for input(s): CKTOTAL, CKMB, CKMBINDEX, TROPONINI in the last 72 hours.  BNP: BNP (last 3 results) Recent Labs    08/09/19 1240  BNP 89.1    ProBNP (last 3 results) Recent Labs    11/23/18 1120 08/09/19 0922  PROBNP 18.0 78.0     D-Dimer No results for input(s): DDIMER in the last 72 hours. Hemoglobin A1C No results for input(s): HGBA1C in the last 72 hours. Fasting Lipid Panel Recent Labs    08/27/19 1611  TRIG 71   Thyroid Function Tests No results for input(s): TSH, T4TOTAL, T3FREE, THYROIDAB in the last 72 hours.  Invalid input(s): FREET3  Other results:   Imaging    DG CHEST PORT 1 VIEW  Result Date: 08/29/2019 CLINICAL DATA:  ET tube and chest tube. Status post CABG  EXAM: PORTABLE CHEST 1 VIEW COMPARISON:  08/29/2019 FINDINGS: ET tube tip is above the carina. The right IJ catheter sheath projects over the SVC. Right subclavian central venous catheter tip is at the cavoatrial junction. There is a feeding tube and nasogastric tube in place. Bilateral chest tubes in place without evidence for pneumothorax. Cardiac enlargement. No pleural effusion. No airspace densities identified. IMPRESSION: 1. Satisfactory position of support apparatus. 2. No pneumothorax. 3. Cardiac enlargement. Electronically Signed   By: Taylor  Stroud M.D.   On: 08/29/2019 09:11     Medications:     Scheduled Medications: . sodium chloride   Intravenous Once  . sodium chloride   Intravenous Once  . amiodarone  200 mg Per Tube BID  . aspirin  81 mg Per Tube Daily  . atorvastatin  40 mg Per Tube Daily  . B-complex with vitamin C  1 tablet Per Tube Daily  . bisacodyl  10 mg Oral Daily   Or  . bisacodyl  10 mg Rectal Daily  . chlorhexidine gluconate (MEDLINE KIT)  15 mL Mouth Rinse BID  . Chlorhexidine Gluconate Cloth  6 each Topical Daily  . clopidogrel  75 mg Per Tube Daily  . docusate  100 mg Per Tube BID  . feeding supplement (PRO-STAT SUGAR FREE 64)  60 mL Per Tube BID  . insulin aspart  0-24 Units Subcutaneous Q4H  . isosorbide dinitrate  20 mg Per Tube TID  . lactulose  30 g Per Tube Daily  . mouth rinse  15 mL Mouth Rinse 10 times per day  . metoCLOPramide (REGLAN) injection  5 mg Intravenous Q8H  . pantoprazole sodium  40 mg Per Tube Daily  . sodium chloride flush  10-40 mL Intracatheter Q12H  . sodium chloride flush  3 mL Intravenous Q12H  . trospium  20 mg Per Tube BID    Infusions: .  prismasol BGK 4/2.5 500 mL/hr at 08/30/19 0412  . sodium chloride 10 mL/hr at 08/30/19 0700  . ceFEPime (MAXIPIME) IV Stopped (08/29/19 2347)  . feeding supplement (VITAL AF 1.2 CAL) 40 mL/hr at 08/30/19 0417  . heparin 850 Units/hr (08/30/19 0600)  . lactated ringers    .  lactated ringers 20 mL/hr at 08/20/19 1200  . magnesium sulfate bolus IVPB    . phenylephrine (NEO-SYNEPHRINE) Adult infusion Stopped (08/30/19 0122)  . prismasol BGK 0/2.5   300 mL/hr at 08/30/19 0201  . prismasol BGK 4/2.5 1,500 mL/hr at 08/30/19 0643  . vancomycin 166.7 mL/hr at 08/29/19 2300    PRN Medications: cromolyn, dextrose, heparin, heparin, hydrALAZINE, HYDROmorphone (DILAUDID) injection, ondansetron (ZOFRAN) IV, oxyCODONE, sodium chloride flush, sodium chloride flush     Assessment/Plan   1. CAD: s/p PDA angioplasty + stent in 02/2014.  Admitted for NSTEMI 4/14. Hs Trop peaked at 3,309.  LHC w/ severe 3V CAD. Echo w/ reduced EF 30-35%. RV ok.  S/p CABG x 4 on 4/20 (LIMA-LAD, RIMA to PDA, Lt radial artery graft to 1st OM and distal OM branches of LCx).  Chest reopened urgently at the bedside for cardiac tamponade and removal of clot on 4/23  - continue ASA/Plavix.  Will need to stop one of the antiplatelet agents ultimately as he will be on anticoagulation at home.  - Radial harvest. On isordil 20 tid.  - Atorvastatin 40 daily.   - Still have not been able to close chest, now has wound vac.  2. Acute Systolic Heart Failure: Ischemic Cardiomyopathy -> cardiogenic shock.  LVEF severely reduced at 30-35%, in the setting of NSTEM/ Multivessel CAD, RV ok initially.  Underwent urgent sternotomy at the bedside for cardiac tamponade and removal of clot on 4/23. Chest remains open. Swan numbers have been concerning for RV failure (swan now out).  Echo 4/24 EF 40 to 45% with inotropic support, small amount of residual pericardial clot, RV moderately hypokinetic.  Off pressors/inotropes. CVP 10, weight now down to below his pre-op baseline.  Co-ox stable 70%. - Continue UF via CVVH today, aim for net negative 50-75 cc/hr.       3.  Postoperative acute hypoxic respiratory failure: Reintubated on 4/23 due to cardiogenic shock and return to OR. Now with tracheostomy, FiO2 0.3. CXR yesterday fairly  clear.  4. AKI: Baseline SCr 0.9. Creatinine peaked to 5.3, suspect ATN in setting of cardiogenic shock/tamponade.  Now on CVVH for volume removal, no urine charted yesterday.  - As above, aim today for UF net 50-75 cc/hr with CVP 10.   5.  Shock liver: Due to cardiac arrest on 4/23.  LFTs trending down.  6. 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 Maintaining NSR with PACs.   - Transition amiodarone back to per tube. - Low dose heparin gtt currently while chest open.   7.  Possible small right lower lobe PE: Chest CT 4/14 w/ small RLL PE.  Bilateral Venous Dopplers negative for DVT.  - Low dose heparin has been started.   - Anticipate switching eliquis prior to d/c.  8. T2DM: Hgb A1c 7.7 -Remains on insulin 9. Pericardial tamponade: 4/23 return to OR for clot removal, chest wall left open. Patient had been on heparin gtt for DCCV of atrial flutter.  - Timing of chest closure per surgery.  10. Ileus: Improving, on Reglan. TFs ongoing.  - Continue Reglan 5 mg IV every 8 hrs.  11. Neuro: Following commands.  CT head negative. - Limit sedation   Mobilize as much as possible, sit up in bed.   Length of Stay: 21  CRITICAL CARE Performed by: Dalton McLean  Total critical care time: 35 minutes  Critical care time was exclusive of separately billable procedures and treating other patients.  Critical care was necessary to treat or prevent imminent or life-threatening deterioration.  Critical care was time spent personally by me on the following activities: development of treatment plan with patient and/or surrogate as well as nursing,   discussions with consultants, evaluation of patient's response to treatment, examination of patient, obtaining history from patient or surrogate, ordering and performing treatments and interventions, ordering and review of laboratory studies, ordering and review of radiographic studies, pulse oximetry and re-evaluation of patient's condition.   Dalton  McLean 08/30/2019 7:58 AM   

## 2019-08-30 NOTE — Progress Notes (Signed)
1 Day Post-Op Procedure(s) (LRB): CHEST WOUND WASHOUT POST OPERATIVE OPEN HEART (N/A) Tracheostomy Wound Vac change Subjective: No complaints  Objective: Vital signs in last 24 hours: Temp:  [97.6 F (36.4 C)-98.8 F (37.1 C)] 98.2 F (36.8 C) (05/05 0748) Pulse Rate:  [52-131] 95 (05/05 0800) Cardiac Rhythm: Normal sinus rhythm (05/05 0800) Resp:  [17-32] 22 (05/05 0800) BP: (79-137)/(42-110) 135/57 (05/05 0800) SpO2:  [98 %-100 %] 99 % (05/05 0800) Arterial Line BP: (81-209)/(35-80) 137/59 (05/05 0800) FiO2 (%):  [30 %] 30 % (05/05 0800) Weight:  [91.9 kg] 91.9 kg (05/05 0600)  Hemodynamic parameters for last 24 hours: CVP:  [0 mmHg-13 mmHg] 10 mmHg  Intake/Output from previous day: 05/04 0701 - 05/05 0700 In: 2020.9 [I.V.:1128.3; NG/GT:450; IV Piggyback:442.6] Out: 2851 [Emesis/NG output:400; Drains:210; Blood:10; Chest Tube:50] Intake/Output this shift: No intake/output data recorded.  General appearance: alert and cooperative Neurologic: intact Heart: regular rate and rhythm, S1, S2 normal, no murmur, click, rub or gallop Lungs: clear to auscultation bilaterally Abdomen: soft, non-tender; bowel sounds normal; no masses,  no organomegaly Extremities: extremities normal, atraumatic, no cyanosis or edema Wound: vac in place  Lab Results: Recent Labs    08/29/19 0635 08/30/19 0322  WBC 19.1* 19.9*  HGB 7.7* 7.8*  HCT 24.8* 24.3*  PLT 229 247   BMET:  Recent Labs    08/29/19 1816 08/30/19 0322  NA 138 136  K 4.0 4.4  CL 105 102  CO2 21* 21*  GLUCOSE 131* 135*  BUN 45* 47*  CREATININE 2.68* 2.33*  CALCIUM 8.3* 8.7*    PT/INR: No results for input(s): LABPROT, INR in the last 72 hours. ABG    Component Value Date/Time   PHART 7.513 (H) 08/20/2019 0404   HCO3 27.9 08/20/2019 0404   TCO2 29 08/20/2019 0404   ACIDBASEDEF 5.0 (H) 08/18/2019 2341   O2SAT 70.4 08/30/2019 0449   CBG (last 3)  Recent Labs    08/30/19 0002 08/30/19 0429  08/30/19 0745  GLUCAP 148* 139* 156*    Assessment/Plan: S/P Procedure(s) (LRB): CHEST WOUND WASHOUT POST OPERATIVE OPEN HEART (N/A) Tracheostomy Wound Vac change Mobilize Diuresis agree wtih PT/Ot   LOS: 21 days    [name] 08/30/2019

## 2019-08-30 NOTE — Progress Notes (Signed)
ANTICOAGULATION CONSULT NOTE - Follow Up Consult  Pharmacy Consult for heparin Indication: pulmonary embolus  Labs: Recent Labs    08/28/19 0849 08/28/19 1631 08/29/19 0323 08/29/19 0323 08/29/19 0635 08/29/19 1816 08/30/19 0322 08/30/19 0323  HGB 8.8*  --  7.7*   < > 7.7*  --  7.8*  --   HCT 28.5*  --  24.8*  --  24.8*  --  24.3*  --   PLT 249  --  224  --  229  --  247  --   HEPARINUNFRC 0.23*  --  0.19*  --   --   --   --  0.20*  CREATININE  --    < > 2.46*  --   --  2.68* 2.33*  --    < > = values in this interval not displayed.    Assessment: 74yo male subtherapeutic on heparin. No gtt issues or signs of bleeding per RN.  Hemoglobin stable at 7.8. S/p trach and chest closure yesterday 5/4. Heparin level at goal, will leave at current rate for now.   Goal of Therapy:  Heparin level 0.2-0.3 units/ml   Plan:  Heparin continues at 850 units/hr -Lower goal due to open chest Daily heparin level for now  Erin Hearing PharmD., BCPS Clinical Pharmacist 08/30/2019 7:31 AM

## 2019-08-31 ENCOUNTER — Inpatient Hospital Stay (HOSPITAL_COMMUNITY): Payer: Medicare Other

## 2019-08-31 ENCOUNTER — Ambulatory Visit: Payer: Federal, State, Local not specified - PPO | Admitting: Cardiology

## 2019-08-31 ENCOUNTER — Inpatient Hospital Stay: Payer: Self-pay

## 2019-08-31 DIAGNOSIS — N179 Acute kidney failure, unspecified: Secondary | ICD-10-CM | POA: Diagnosis not present

## 2019-08-31 DIAGNOSIS — I214 Non-ST elevation (NSTEMI) myocardial infarction: Secondary | ICD-10-CM | POA: Diagnosis not present

## 2019-08-31 DIAGNOSIS — I5023 Acute on chronic systolic (congestive) heart failure: Secondary | ICD-10-CM | POA: Diagnosis not present

## 2019-08-31 DIAGNOSIS — R570 Cardiogenic shock: Secondary | ICD-10-CM | POA: Diagnosis not present

## 2019-08-31 LAB — GLUCOSE, CAPILLARY
Glucose-Capillary: 122 mg/dL — ABNORMAL HIGH (ref 70–99)
Glucose-Capillary: 131 mg/dL — ABNORMAL HIGH (ref 70–99)
Glucose-Capillary: 186 mg/dL — ABNORMAL HIGH (ref 70–99)
Glucose-Capillary: 174 mg/dL — ABNORMAL HIGH (ref 70–99)
Glucose-Capillary: 209 mg/dL — ABNORMAL HIGH (ref 70–99)

## 2019-08-31 LAB — RENAL FUNCTION PANEL
Albumin: 2.3 g/dL — ABNORMAL LOW (ref 3.5–5.0)
Albumin: 2.2 g/dL — ABNORMAL LOW (ref 3.5–5.0)
Anion gap: 8 (ref 5–15)
Anion gap: 13 (ref 5–15)
BUN: 47 mg/dL — ABNORMAL HIGH (ref 8–23)
BUN: 50 mg/dL — ABNORMAL HIGH (ref 8–23)
CO2: 23 mmol/L (ref 22–32)
CO2: 24 mmol/L (ref 22–32)
Calcium: 8.3 mg/dL — ABNORMAL LOW (ref 8.9–10.3)
Calcium: 8.3 mg/dL — ABNORMAL LOW (ref 8.9–10.3)
Chloride: 104 mmol/L (ref 98–111)
Chloride: 101 mmol/L (ref 98–111)
Creatinine, Ser: 2.35 mg/dL — ABNORMAL HIGH (ref 0.61–1.24)
Creatinine, Ser: 2.35 mg/dL — ABNORMAL HIGH (ref 0.61–1.24)
GFR calc Af Amer: 31 mL/min — ABNORMAL LOW (ref >60)
GFR calc Af Amer: 31 mL/min — ABNORMAL LOW (ref >60)
GFR calc non Af Amer: 27 mL/min — ABNORMAL LOW (ref >60)
GFR calc non Af Amer: 27 mL/min — ABNORMAL LOW (ref >60)
Glucose, Bld: 138 mg/dL — ABNORMAL HIGH (ref 70–99)
Glucose, Bld: 206 mg/dL — ABNORMAL HIGH (ref 70–99)
Phosphorus: 3 mg/dL (ref 2.5–4.6)
Phosphorus: 2.3 mg/dL — ABNORMAL LOW (ref 2.5–4.6)
Potassium: 4 mmol/L (ref 3.5–5.1)
Potassium: 4.1 mmol/L (ref 3.5–5.1)
Sodium: 135 mmol/L (ref 135–145)
Sodium: 138 mmol/L (ref 135–145)

## 2019-08-31 LAB — CBC WITH DIFFERENTIAL/PLATELET
Abs Immature Granulocytes: 1.26 10*3/uL — ABNORMAL HIGH (ref 0.00–0.07)
Basophils Absolute: 0.2 10*3/uL — ABNORMAL HIGH (ref 0.0–0.1)
Basophils Relative: 1 %
Eosinophils Absolute: 0.3 10*3/uL (ref 0.0–0.5)
Eosinophils Relative: 1 %
HCT: 25 % — ABNORMAL LOW (ref 39.0–52.0)
Hemoglobin: 7.9 g/dL — ABNORMAL LOW (ref 13.0–17.0)
Immature Granulocytes: 5 %
Lymphocytes Relative: 6 %
Lymphs Abs: 1.5 10*3/uL (ref 0.7–4.0)
MCH: 30.6 pg (ref 26.0–34.0)
MCHC: 31.6 g/dL (ref 30.0–36.0)
MCV: 96.9 fL (ref 80.0–100.0)
Monocytes Absolute: 2.1 10*3/uL — ABNORMAL HIGH (ref 0.1–1.0)
Monocytes Relative: 8 %
Neutro Abs: 22 10*3/uL — ABNORMAL HIGH (ref 1.7–7.7)
Neutrophils Relative %: 79 %
Platelets: 298 10*3/uL (ref 150–400)
RBC: 2.58 MIL/uL — ABNORMAL LOW (ref 4.22–5.81)
RDW: 17.8 % — ABNORMAL HIGH (ref 11.5–15.5)
WBC: 27.3 10*3/uL — ABNORMAL HIGH (ref 4.0–10.5)
nRBC: 0.7 % — ABNORMAL HIGH (ref 0.0–0.2)

## 2019-08-31 LAB — PROCALCITONIN: Procalcitonin: 4.95 ng/mL

## 2019-08-31 LAB — HEPARIN LEVEL (UNFRACTIONATED)
Heparin Unfractionated: 0.12 IU/mL — ABNORMAL LOW (ref 0.30–0.70)
Heparin Unfractionated: 0.1 IU/mL — ABNORMAL LOW (ref 0.30–0.70)
Heparin Unfractionated: 0.19 IU/mL — ABNORMAL LOW (ref 0.30–0.70)

## 2019-08-31 LAB — COOXEMETRY PANEL
Carboxyhemoglobin: 0.7 % (ref 0.5–1.5)
Carboxyhemoglobin: 1.1 % (ref 0.5–1.5)
Carboxyhemoglobin: 1.3 % (ref 0.5–1.5)
Methemoglobin: 0.9 % (ref 0.0–1.5)
Methemoglobin: 1.1 % (ref 0.0–1.5)
Methemoglobin: 1.2 % (ref 0.0–1.5)
O2 Saturation: 46.6 %
O2 Saturation: 44.3 %
O2 Saturation: 48.2 %
Total hemoglobin: 11.8 g/dL — ABNORMAL LOW (ref 12.0–16.0)
Total hemoglobin: 14 g/dL (ref 12.0–16.0)
Total hemoglobin: 11.5 g/dL — ABNORMAL LOW (ref 12.0–16.0)

## 2019-08-31 LAB — MAGNESIUM: Magnesium: 2.9 mg/dL — ABNORMAL HIGH (ref 1.7–2.4)

## 2019-08-31 MED ORDER — NOREPINEPHRINE 16 MG/250ML-% IV SOLN
0.0000 ug/min | INTRAVENOUS | Status: DC
  Administered 2019-08-31: 10 ug/min via INTRAVENOUS
  Administered 2019-09-01: 27 ug/min via INTRAVENOUS
  Filled 2019-08-31 (×3): qty 250

## 2019-08-31 MED ORDER — PRO-STAT SUGAR FREE PO LIQD
60.0000 mL | Freq: Three times a day (TID) | ORAL | Status: DC
  Administered 2019-08-31 – 2019-10-09 (×96): 60 mL
  Filled 2019-08-31 (×102): qty 60

## 2019-08-31 MED ORDER — VITAL 1.5 CAL PO LIQD
1000.0000 mL | ORAL | Status: DC
  Administered 2019-08-31 – 2019-09-19 (×16): 1000 mL
  Filled 2019-08-31 (×26): qty 1000

## 2019-08-31 MED ORDER — MIDODRINE HCL 5 MG PO TABS
10.0000 mg | ORAL_TABLET | Freq: Three times a day (TID) | ORAL | Status: DC
  Administered 2019-08-31 – 2019-09-15 (×38): 10 mg via ORAL
  Filled 2019-08-31 (×42): qty 2

## 2019-08-31 MED ORDER — SODIUM CHLORIDE 0.9% FLUSH
10.0000 mL | INTRAVENOUS | Status: DC | PRN
  Administered 2019-09-26: 10 mL

## 2019-08-31 MED ORDER — NOREPINEPHRINE 4 MG/250ML-% IV SOLN
0.0000 ug/min | INTRAVENOUS | Status: DC
  Filled 2019-08-31: qty 250

## 2019-08-31 MED ORDER — SODIUM CHLORIDE 0.9% FLUSH
10.0000 mL | Freq: Two times a day (BID) | INTRAVENOUS | Status: DC
  Administered 2019-08-31 – 2019-09-09 (×16): 10 mL
  Administered 2019-09-09: 20 mL
  Administered 2019-09-10 – 2019-09-13 (×7): 10 mL
  Administered 2019-09-13: 20 mL
  Administered 2019-09-14 – 2019-09-20 (×11): 10 mL

## 2019-08-31 NOTE — Progress Notes (Signed)
NAME:  Christian [name], MRN:  [id number], DOB:  15-Feb-1946, LOS: 45 ADMISSION DATE:  08/09/2019, CONSULTATION DATE:  08/28/2019 REFERRING MD:  Orvan Seen - TRH, CHIEF COMPLAINT:  Respiratory failure.    HPI/course in hospital   74 year old man who presented with progressive dyspnea.  He was admitted 4/14 and was incidentally found to have a right lower lobe pulmonary embolism.  Overall the picture was that of decompensated heart failure with a non-STEMI.  Coronary angiography revealed triple-vessel disease and the patient was referred for cardiac surgery.  EF was found to be reduced at that time at  He underwent four-vessel CABG 4/20 with good quality distal targets.  Cardiac function remained reduced post bypass.  Unable to close chest due to surrounding edema.  Underwent attempted closure with VAC placement 4/22.   Cardiac arrest 4/23 with repeated washouts with VAC placements on 4/26 and 4/29.  Significant volume overload postoperatively.  Acute kidney injury.  Currently on CRRT for fluid removal.   Past Medical History   Past Medical History:  Diagnosis Date  . BPH (benign prostatic hypertrophy)   . Coronary artery disease   . Diabetes mellitus without complication ([place])   . Dry eyes left  . Hiatal hernia   . 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  LIMA TO LAD, RIMA TO PDA, RADIAL ARTERY TO CIRC AND SEQUENTIALLY TO OM1. 08/15/2019  . Hyperlipidemia   . Hypertension   . Incomplete bladder emptying   . Nocturia   . 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 SOB (shortness of breath) on exertion      Past Surgical History:  Procedure Laterality Date  . APPLICATION OF WOUND VAC N/A 08/24/2019   Procedure: APPLICATION OF WOUND VAC;  Surgeon: [name], MD;  Location: [facility];  Service: Thoracic;  Laterality: N/A;  . APPLICATION OF WOUND VAC   08/29/2019   Procedure: Wound Vac change;  Surgeon: [name], MD;  Location: [facility];  Service: Open Heart Surgery;;  . CARDIAC CATHETERIZATION    . CORONARY ARTERY BYPASS GRAFT N/A 08/15/2019   Procedure: CORONARY ARTERY BYPASS GRAFTING (CABG), x 4 using bilateral IMAs and left radial artery .  LIMA TO LAD, RIMA TO PDA, RADIAL ARTERY TO CIRC AND SEQUENTIALLY TO OM1.;  Surgeon: [name], MD;  Location: [place];  Service: Open Heart Surgery;  Laterality: N/A;  . CORONARY STENT PLACEMENT  02/27/2014   distal rt/pd coronary       dr Einar Gip  . CYSTO/ BLADDER BIOPSY'S/ CAUTHERIZATION  01-14-2004  DR Gaynelle Arabian  . EXPLORATION POST OPERATIVE OPEN HEART N/A 08/16/2019   Procedure: Chest Closure S?P CABG WITH APPLICATION OF PREVENA  INCISIONAL WOUND VAC;  Surgeon: [name], MD;  Location: [facility];  Service: Open Heart Surgery;  Laterality: N/A;  . EXPLORATION POST OPERATIVE OPEN HEART N/A 08/21/2019   Procedure: CHEST WASHOUT S/P OPEN CHEST;  Surgeon: [name], MD;  Location: [place];  Service: Open Heart Surgery;  Laterality: N/A;  Open chest with Esmark dressing with Ioban sealant coverage.  . EXPLORATION POST OPERATIVE OPEN HEART N/A 08/18/2019   Procedure: EXPLORATION POST OPERATIVE OPEN HEART (performed 04/23 on unit);  Surgeon: [name], MD;  Location: [place];  Service: Open Heart Surgery;  Laterality: N/A;  . EXPLORATION POST OPERATIVE OPEN  HEART N/A 08/24/2019   Procedure: CHEST WASHOUT POST OPERATIVE OPEN HEART;  Surgeon: [name], MD;  Location: [place];  Service: Open Heart Surgery;  Laterality: N/A;  . EXPLORATION POST OPERATIVE OPEN HEART N/A 08/29/2019   Procedure: CHEST WOUND WASHOUT POST OPERATIVE OPEN HEART;  Surgeon: [name], MD;  Location: [place];  Service: Open Heart Surgery;  Laterality: N/A;  . LEFT HEART CATH AND CORONARY ANGIOGRAPHY N/A 08/10/2019   Procedure: LEFT HEART CATH AND CORONARY ANGIOGRAPHY;  Surgeon: Nigel Mormon, MD;   Location: [place] CV LAB;  Service: Cardiovascular;  Laterality: N/A;  . LEFT HEART CATHETERIZATION WITH CORONARY ANGIOGRAM N/A 02/27/2014   Procedure: LEFT HEART CATHETERIZATION WITH CORONARY ANGIOGRAM;  Surgeon: Laverda Page, MD;  Location: [facility] CATH LAB;  Service: Cardiovascular;  Laterality: N/A;  . PERCUTANEOUS CORONARY STENT INTERVENTION (PCI-S)  02/27/2014   Procedure: PERCUTANEOUS CORONARY STENT INTERVENTION (PCI-S);  Surgeon: Laverda Page, MD;  Location: [facility] CATH LAB;  Service: Cardiovascular;;  rt PDA  3.0/28mm Promus stent  . RADIAL ARTERY HARVEST Left 08/15/2019   Procedure: Radial Artery Harvest;  Surgeon: [name], MD;  Location: [place];  Service: Open Heart Surgery;  Laterality: Left;  . TEE WITHOUT CARDIOVERSION N/A 08/15/2019   Procedure: TRANSESOPHAGEAL ECHOCARDIOGRAM (TEE);  Surgeon: [name], MD;  Location: [place];  Service: Open Heart Surgery;  Laterality: N/A;  . TRACHEOSTOMY TUBE PLACEMENT  08/29/2019   Procedure: Tracheostomy;  Surgeon: [name], MD;  Location: [place] OR;  Service: Open Heart Surgery;;  . TRANSURETHRAL RESECTION OF PROSTATE  04/04/2012   Procedure: TRANSURETHRAL RESECTION OF THE PROSTATE WITH GYRUS INSTRUMENTS;  Surgeon: Ailene Rud, MD;  Location: [place];  Service: Urology;  Laterality: N/A;  . TRANSURETHRAL RESECTION OF PROSTATE N/A 09/27/2014   Procedure: TRANSURETHRAL RESECTION OF THE PROSTATE ;  Surgeon: Carolan Clines, MD;  Location: [facility];  Service: Urology;  Laterality: N/A;  . UPPER GASTROINTESTINAL ENDOSCOPY       Review of Systems:   Review of Systems  Unable to perform ROS: Critical illness    Social History   reports that he quit smoking about 29 years ago. His smoking use included cigarettes. He has a 10.00 pack-year smoking history. He has never used smokeless tobacco. He reports current alcohol use. He reports that he does not use drugs.   Family History   His family history  includes Diabetes in his brother, brother, father, and mother; Hypertension in his brother and mother.   Allergies No Known Allergies   Home Medications  Prior to Admission medications   Medication Sig Start Date End Date Taking? Authorizing Provider  aspirin EC 81 MG tablet Take 81 mg by mouth daily.   Yes [provider]  atorvastatin (LIPITOR) 20 MG tablet TAKE 1 TABLET BY MOUTH EVERY DAY IN THE EVENING Patient taking differently: Take 20 mg by mouth every evening.  06/22/19  Yes Adrian Prows, MD  cloNIDine (CATAPRES) 0.2 MG tablet TAKE 1 TABLET (0.2 MG TOTAL) BY MOUTH DAILY. 06/22/19  Yes Adrian Prows, MD  cromolyn (OPTICROM) 4 % ophthalmic solution Place 1 drop into both eyes 4 (four) times daily as needed (dry eyes).  02/02/17  Yes [provider]  empagliflozin (JARDIANCE) 10 MG TABS tablet Take 10 mg by mouth daily before breakfast. 07/25/19  Yes Marrian Salvage, FNP  furosemide (LASIX) 20 MG tablet TAKE 1 TABLET BY MOUTH EVERY DAY Patient taking differently: Take 20  mg by mouth daily.  08/01/19  Yes Adrian Prows, MD  isosorbide mononitrate (IMDUR) 60 MG 24 hr tablet Take 60 mg by mouth daily.   Yes [provider]  metFORMIN (GLUCOPHAGE) 1000 MG tablet TAKE 1 TABLET TWICE A DAY WITH A MEAL Patient taking differently: Take 1,000 mg by mouth 2 (two) times daily with a meal.  07/31/19  Yes Hoyt Koch, MD  metoprolol succinate (TOPROL-XL) 100 MG 24 hr tablet Take 1 tablet (100 mg total) by mouth daily. Take with or immediately following a meal. 07/24/19  Yes Adrian Prows, MD  nitroGLYCERIN (NITROSTAT) 0.4 MG SL tablet Place 1 tablet (0.4 mg total) under the tongue every 5 (five) minutes as needed for chest pain. 06/01/19  Yes Adrian Prows, MD  olmesartan-hydrochlorothiazide (BENICAR HCT) 40-25 MG tablet TAKE 1 TABLET BY MOUTH EVERY DAY Patient taking differently: Take 1 tablet by mouth daily.  07/03/19  Yes Adrian Prows, MD  PREVIDENT 5000 BOOSTER PLUS 1.1 % PSTE Take  1 application by mouth daily.  06/14/18  Yes [provider]  silodosin (RAPAFLO) 8 MG CAPS capsule Take 8 mg by mouth at bedtime. 07/27/19  Yes [provider]  spironolactone (ALDACTONE) 25 MG tablet Take 1 tablet (25 mg total) by mouth daily. 06/01/19 08/30/19 Yes Adrian Prows, MD  sucralfate (CARAFATE) 1 GM/10ML suspension Take 10 mLs (1 g total) by mouth 4 (four) times daily -  with meals and at bedtime. 07/24/19  Yes Marrian Salvage, FNP  tamsulosin [facility]) 0.4 MG CAPS capsule Take 0.4 mg by mouth daily after breakfast.  05/20/18  Yes [provider]  Trospium Chloride 60 MG CP24 Take 1 capsule by mouth daily. 07/31/19  Yes [provider]  verapamil (VERELAN PM) 240 MG 24 hr capsule TAKE 1 CAPSULE BY MOUTH EVERY DAY Patient taking differently: Take 240 mg by mouth daily.  07/24/19  Yes Adrian Prows, MD     Interim history/subjective:  Tracheostomy placed yesterday. Unable to close chest due to edema.  Episodes of hypotension have limited fluid removal  Objective   Blood pressure (!) 111/55, pulse (!) 110, temperature 98.6 F (37 C), temperature source Oral, resp. rate (!) 32, height 5\' 9"  (1.753 m), weight 93.1 kg, SpO2 100 %. CVP:  [2 mmHg-17 mmHg] 2 mmHg  Vent Mode: PRVC FiO2 (%):  [30 %-35 %] 35 % Set Rate:  [18 bmp] 18 bmp Vt Set:  [560 mL] 560 mL PEEP:  [5 cmH20] 5 cmH20 Plateau Pressure:  [24 cmH20] 24 cmH20   Intake/Output Summary (Last 24 hours) at 08/31/2019 1104 Last data filed at 08/31/2019 1100 Gross per 24 hour  Intake 2106 ml  Output 3057 ml  Net -951 ml   Filed Weights   08/29/19 0620 08/30/19 0600 08/31/19 0500  Weight: 93.8 kg 91.9 kg 93.1 kg    Examination: Physical Exam Constitutional:      Appearance: He is obese.     Comments: On ventilator, off sedation.   HENT:     Nose:     Comments: Small bore feeding tube in place.     Mouth/Throat:     Comments: ETT tube in place with no skin breakdown Eyes:     General: No  scleral icterus.    Extraocular Movements: Extraocular movements intact.     Conjunctiva/sclera: Conjunctivae normal.  Cardiovascular:     Rate and Rhythm: Normal rate and regular rhythm.     Arteriovenous access: right arteriovenous access is present.  Comments: Distant heart sounds.  Right IJ introducer and [place] HD catheter. Pulmonary:     Effort: Pulmonary effort is normal.     Comments: Comfortable on trach collar trial. Chest:     Comments: Mild VAC dressing in place Abdominal:     General: Bowel sounds are absent. There is distension.     Palpations: Abdomen is soft.  Genitourinary:    Comments: Condom catheter Musculoskeletal:     Right lower leg: 2+ Pitting Edema present.     Left lower leg: 2+ Pitting Edema present.  Skin:    General: Skin is warm.     Capillary Refill: Capillary refill takes 2 to 3 seconds.  Neurological:     Mental Status: He is alert.     GCS: GCS eye subscore is 4. GCS verbal subscore is 5. GCS motor subscore is 6.     Motor: Weakness present. No tremor.     Comments: Able to stick tongue now able to move limbs weakly.       Ancillary tests (personally reviewed)  CBC: Recent Labs  Lab 08/28/19 0849 08/29/19 0323 08/29/19 0635 08/30/19 0322 08/31/19 0353  WBC 17.4* 18.0* 19.1* 19.9* 27.3*  NEUTROABS  --  14.9*  --  17.1* 22.0*  HGB 8.8* 7.7* 7.7* 7.8* 7.9*  HCT 28.5* 24.8* 24.8* 24.3* 25.0*  MCV 96.0 96.1 96.1 95.3 96.9  PLT 249 224 229 247 315    Basic Metabolic Panel: Recent Labs  Lab 08/27/19 0328 08/27/19 1611 08/28/19 0424 08/28/19 1631 08/29/19 0323 08/29/19 1816 08/30/19 0322 08/30/19 1605 08/31/19 0353  NA 135   < > 137   < > 136 138 136 136 135  K 5.1   < > 4.7   < > 4.0 4.0 4.4 3.9 4.0  CL 102   < > 101   < > 100 105 102 101 104  CO2 23   < > 23   < > 23 21* 21* 21* 23  GLUCOSE 206*   < > 112*   < > 158* 131* 135* 154* 138*  BUN 58*   < > 44*   < > 48* 45* 47* 46* 47*  CREATININE 2.40*   < > 2.43*   < > 2.46*  2.68* 2.33* 2.29* 2.35*  CALCIUM 8.6*   < > 8.8*   < > 8.7* 8.3* 8.7* 8.5* 8.3*  MG 2.6*  --  2.9*  --  2.8*  --  2.7*  --  2.9*  PHOS 3.9   < > 3.9   < > 3.4 4.1 3.7 2.8 3.0   < > = values in this interval not displayed.   GFR: Estimated Creatinine Clearance: 32 mL/min (A) (by C-G formula based on SCr of 2.35 mg/dL (H)). Recent Labs  Lab 08/29/19 0323 08/29/19 0635 08/30/19 0322 08/31/19 0353 08/31/19 0827  PROCALCITON  --   --   --   --  4.95  WBC 18.0* 19.1* 19.9* 27.3*  --     Liver Function Tests: Recent Labs  Lab 08/29/19 0323 08/29/19 1816 08/30/19 0322 08/30/19 1605 08/31/19 0353  ALBUMIN 2.3* 2.2* 2.3* 2.2* 2.3*   No results for input(s): LIPASE, AMYLASE in the last 168 hours. No results for input(s): AMMONIA in the last 168 hours.  ABG    Component Value Date/Time   PHART 7.513 (H) 08/20/2019 0404   PCO2ART 34.7 08/20/2019 0404   PO2ART 216 (H) 08/20/2019 0404   HCO3 27.9 08/20/2019 0404   TCO2  29 08/20/2019 0404   ACIDBASEDEF 5.0 (H) 08/18/2019 2341   O2SAT 44.3 08/31/2019 0801     Coagulation Profile: No results for input(s): INR, PROTIME in the last 168 hours.  Cardiac Enzymes: No results for input(s): CKTOTAL, CKMB, CKMBINDEX, TROPONINI in the last 168 hours.  HbA1C: Hgb A1c MFr Bld  Date/Time Value Ref Range Status  08/14/2019 07:45 PM 7.7 (H) 4.8 - 5.6 % Final    Comment:    (NOTE) Pre diabetes:          5.7%-6.4% Diabetes:              >6.4% Glycemic control for   <7.0% adults with diabetes   07/24/2019 03:12 PM 7.7 (H) 4.6 - 6.5 % Final    Comment:    Glycemic Control Guidelines for People with Diabetes:Non Diabetic:  <6%Goal of Therapy: <7%Additional Action Suggested:  >8%     CBG: Recent Labs  Lab 08/30/19 1533 08/30/19 2000 08/30/19 2336 08/31/19 0351 08/31/19 0751  GLUCAP 135* 121* 139* 122* 131*   CXR 08/27/19:  Small left effusion, retrocardiac consolidation. (personal interpretation)  Echo 4/24: EF 45% with Grade 2  DD.   Assessment & Plan:   Critically ill due to prolonged acute hypoxic hypercapnic respiratory failure requiring mechanical ventilation following cardiac surgery. While able to tolerate brief SBT, doubt will be able to protect airway if extubated. - Open-ended trach collar trial  Acute decompensated systolic and diastolic heart failure. Now off all vasoactive infusions. - Holding ISDN and HDLZ to allow for aggressive fluid removal  - will add midodrine to facilitate fluid removal   Critically ill due to acute kidney injury requiring CRRT to control volume overload and electrolyte imbalances. - Continue to UF to remove edema with primary goal to permit definitive chest closure.  - Support BP with low-dose vasopr  Status post CABG with multiple chest explorations and VAC dressing still in place - For eventual closure at a latter day. .  Generalized frailty.  Weakness of critical illness. [place] Suspect much of the fluid is now extravascular due to cachexia and immobility - Limit sedatives to promote interactivity and help regain function.  Gastroparesis of acute illness, narcotic use.  - Clamp OGT post OR today.   Daily Goals Checklist  Pain/Anxiety/Delirium protocol (if indicated): intermittent dilaudid only.  VAP protocol (if indicated): bundle in place.  Respiratory support goals: daily SBT Blood pressure target: Keep MAP >65. Start midodrine and use phenylephrine to support BP as fluid removal is the priority. DVT prophylaxis: on IV heparin Nutritional status and feeding goals: high nutritional risk. Move tube into small bowel. Assess nutritional adequacy, target 2g/kg/d protein intake. GI prophylaxis: Pantoprazole Fluid status goals: continue to UF at -75/h, minimize fluids Urinary catheter: condom  Central lines: right IJ and [place], arterial line Glucose control: currently euglycemic Mobility/therapy needs: will need aggressive therapy. Antibiotic de-escalation:  prophylaxis for open chest.  Home medication reconciliation: on hold Daily labs: CBC, BMP Code Status: full  Family Communication: per cardiac surgery. Disposition: ICU   CRITICAL CARE Performed by: Kipp Brood   Total critical care time: 35 minutes  Critical care time was exclusive of separately billable procedures and treating other patients.  Critical care was necessary to treat or prevent imminent or life-threatening deterioration.  Critical care was time spent personally by me on the following activities: development of treatment plan with patient and/or surrogate as well as nursing, discussions with consultants, evaluation of patient's response to treatment, examination of patient,  obtaining history from patient or surrogate, ordering and performing treatments and interventions, ordering and review of laboratory studies, ordering and review of radiographic studies, pulse oximetry, re-evaluation of patient's condition and participation in multidisciplinary rounds.  Kipp Brood, MD [facility] ICU Physician [place]  Pager: [phone] Mobile: [phone] After hours: [phone].    08/31/2019, 11:04 AM

## 2019-08-31 NOTE — Progress Notes (Addendum)
Patient ID: Christian [name], male   DOB: 1945/06/04, 74 y.o.   MRN: [id number]     Advanced Heart Failure Rounding Note  PCP-Cardiologist: No primary care provider on file.   Subjective:    08/09/19 Admitted with NSTEM and ? Small RLL PE. Weight 213 pounds.  08/15/19 CABG x4 on 08/15/19 08/18/19 Cardiac arrest due to tamponade. Chest opened for washout 4/26 Chest washout in OR 4/29 CVVH started 5/5 OR for chest washout, tracheostomy  Vented with tracheostomy, FiO2 0.3.    Awake, following commands.    Back on phenylephrine at 100.  SBP 008Q but diastolic BP runs low.   Has AKI. Suspect ATN from cardiogenic shock/pericardial tamponade. CVVH initiated 4/29 to assist w/ volume removal. I/O mildly negative, weight down again.  CVP 6.  I/Os net negative 1177 yesterday.  Unable to pull negative overnight due to low MAP.   On vanc/cefepime in setting of open chest. Afebrile. WBCs up to 27, hgb 7.9  NSR this morning with PACs, remains on heparin gtt.  Amiodarone now po.   Echo 4/25: EF 40-45% with some residual pericardial clot.  Objective:   Weight Range: 93.1 kg Body mass index is 30.31 kg/m.   Vital Signs:   Temp:  [98.6 F (37 C)-99.2 F (37.3 C)] 98.6 F (37 C) (05/06 0730) Pulse Rate:  [80-144] 139 (05/06 0700) Resp:  [12-34] 23 (05/06 0700) BP: (86-126)/(40-47) 86/47 (05/06 0006) SpO2:  [98 %-100 %] 100 % (05/06 0700) Arterial Line BP: (85-151)/(38-66) 110/52 (05/06 0700) FiO2 (%):  [30 %-35 %] 30 % (05/06 0445) Weight:  [93.1 kg] 93.1 kg (05/06 0500) Last BM Date: 08/31/19  Weight change: Filed Weights   08/29/19 0620 08/30/19 0600 08/31/19 0500  Weight: 93.8 kg 91.9 kg 93.1 kg    Intake/Output:   Intake/Output Summary (Last 24 hours) at 08/31/2019 0801 Last data filed at 08/31/2019 0700 Gross per 24 hour  Intake 1843.24 ml  Output 2979 ml  Net -1135.76 ml      Physical Exam   General: NAD, tracheostomy Neck: No JVD, no thyromegaly or thyroid nodule.    Lungs: Decreased at bases.  CV: Nondisplaced PMI.  Chest wall open. Heart regular S1/S2, no S3/S4, no murmur.  1+ edema to knees.   Abdomen: Soft, nontender, no hepatosplenomegaly, no distention.  Skin: Intact without lesions or rashes.  Neurologic: Awake on vent. Extremities: No clubbing or cyanosis.  HEENT: Normal.    Telemetry   NSR 90s-100s with PACs  (personally reviewed)  EKG    N/a   Labs    CBC Recent Labs    08/30/19 0322 08/31/19 0353  WBC 19.9* 27.3*  NEUTROABS 17.1* 22.0*  HGB 7.8* 7.9*  HCT 24.3* 25.0*  MCV 95.3 96.9  PLT 247 761   Basic Metabolic Panel Recent Labs    08/30/19 0322 08/30/19 0322 08/30/19 1605 08/31/19 0353  NA 136   < > 136 135  K 4.4   < > 3.9 4.0  CL 102   < > 101 104  CO2 21*   < > 21* 23  GLUCOSE 135*   < > 154* 138*  BUN 47*   < > 46* 47*  CREATININE 2.33*   < > 2.29* 2.35*  CALCIUM 8.7*   < > 8.5* 8.3*  MG 2.7*  --   --  2.9*  PHOS 3.7   < > 2.8 3.0   < > = values in this interval not displayed.   Liver Function  Tests Recent Labs    08/30/19 1605 08/31/19 0353  ALBUMIN 2.2* 2.3*   No results for input(s): LIPASE, AMYLASE in the last 72 hours. Cardiac Enzymes No results for input(s): CKTOTAL, CKMB, CKMBINDEX, TROPONINI in the last 72 hours.  BNP: BNP (last 3 results) Recent Labs    08/09/19 1240  BNP 89.1    ProBNP (last 3 results) Recent Labs    11/23/18 1120 08/09/19 0922  PROBNP 18.0 78.0     D-Dimer No results for input(s): DDIMER in the last 72 hours. Hemoglobin A1C No results for input(s): HGBA1C in the last 72 hours. Fasting Lipid Panel No results for input(s): CHOL, HDL, LDLCALC, TRIG, CHOLHDL, LDLDIRECT in the last 72 hours. Thyroid Function Tests No results for input(s): TSH, T4TOTAL, T3FREE, THYROIDAB in the last 72 hours.  Invalid input(s): FREET3  Other results:   Imaging    DG Abd Portable 1V  Result Date: 08/30/2019 CLINICAL DATA:  Feeding tube placement. EXAM: PORTABLE  ABDOMEN - 1 VIEW COMPARISON:  08/28/2019 FINDINGS: 0855 hours. The tip of the feeding tube is in the distal duodenum near the ligament of Treitz. Diffuse gaseous bowel distension again noted. IMPRESSION: Feeding tube tip is in the distal duodenum. Electronically Signed   By: Misty Stanley M.D.   On: 08/30/2019 10:31     Medications:     Scheduled Medications: . sodium chloride   Intravenous Once  . sodium chloride   Intravenous Once  . amiodarone  200 mg Per Tube BID  . aspirin  81 mg Per Tube Daily  . atorvastatin  40 mg Per Tube Daily  . B-complex with vitamin C  1 tablet Per Tube Daily  . bisacodyl  10 mg Oral Daily   Or  . bisacodyl  10 mg Rectal Daily  . chlorhexidine gluconate (MEDLINE KIT)  15 mL Mouth Rinse BID  . Chlorhexidine Gluconate Cloth  6 each Topical Daily  . clopidogrel  75 mg Per Tube Daily  . docusate  100 mg Per Tube BID  . feeding supplement (PRO-STAT SUGAR FREE 64)  60 mL Per Tube BID  . insulin aspart  0-24 Units Subcutaneous Q4H  . mouth rinse  15 mL Mouth Rinse 10 times per day  . metoCLOPramide (REGLAN) injection  5 mg Intravenous Q8H  . midodrine  10 mg Oral TID WC  . pantoprazole sodium  40 mg Per Tube Daily  . sodium chloride flush  10-40 mL Intracatheter Q12H  . sodium chloride flush  3 mL Intravenous Q12H  . trospium  20 mg Per Tube BID    Infusions: .  prismasol BGK 4/2.5 500 mL/hr at 08/31/19 0032  . sodium chloride Stopped (08/31/19 0347)  . ceFEPime (MAXIPIME) IV Stopped (08/31/19 0019)  . feeding supplement (VITAL AF 1.2 CAL) 65 mL/hr at 08/31/19 0200  . heparin 850 Units/hr (08/31/19 0700)  . lactated ringers    . lactated ringers 20 mL/hr at 08/20/19 1200  . magnesium sulfate bolus IVPB    . norepinephrine (LEVOPHED) Adult infusion    . prismasol BGK 0/2.5 1 each (08/30/19 1907)  . prismasol BGK 4/2.5 1,500 mL/hr at 08/31/19 0655  . vancomycin      PRN Medications: cromolyn, dextrose, heparin, heparin, hydrALAZINE, HYDROmorphone  (DILAUDID) injection, ondansetron (ZOFRAN) IV, oxyCODONE, sodium chloride flush, sodium chloride flush     Assessment/Plan   1. CAD: s/p PDA angioplasty + stent in 02/2014.  Admitted for NSTEMI 4/14. Hs Trop peaked at 3,309.  LHC w/ severe  3V CAD. Echo w/ reduced EF 30-35%. RV ok.  S/p CABG x 4 on 4/20 (LIMA-LAD, RIMA to PDA, Lt radial artery graft to 1st OM and distal OM branches of LCx).  Chest reopened urgently at the bedside for cardiac tamponade and removal of clot on 4/23  - continue ASA/Plavix.  Will need to stop one of the antiplatelet agents ultimately as he will be on anticoagulation at home.  - Radial harvest. On isordil 20 tid.  - Atorvastatin 40 daily.   - Still have not been able to close chest, now has wound vac.  2. Acute Systolic Heart Failure: Ischemic Cardiomyopathy -> cardiogenic shock.  LVEF severely reduced at 30-35%, in the setting of NSTEM/ Multivessel CAD, RV ok initially.  Underwent urgent sternotomy at the bedside for cardiac tamponade and removal of clot on 4/23. Chest remains open. Luiz Blare numbers have been concerning for RV failure (swan now out).  Echo 4/24 EF 40 to 45% with inotropic support, small amount of residual pericardial clot, RV moderately hypokinetic.  Co-ox 48% this morning, lower than past.  CVP 6.  He is back on phenylephrine 100, SBP 110s but DBP low. - Repeat co-ox.  - Can wean off pressor for SBP > 90, if ongoing pressor requirement will change to norepinephrine.  - Increase midodrine to 10 mg tid.  - Keep CVVH closer to even to -50 cc/hr today.        3.  Postoperative acute hypoxic respiratory failure: Reintubated on 4/23 due to cardiogenic shock and return to OR. Now with tracheostomy, FiO2 0.3.  - Trach collar trials per CCM.   4. AKI: Baseline SCr 0.9. Creatinine peaked to 5.3, suspect ATN in setting of cardiogenic shock/tamponade.  Now on CVVH for volume removal, 50 cc urine charted yesterday.  - Today with CVP 6 and lower MAP, keep UF even to  -50.    5. Shock liver: Due to cardiac arrest on 4/23.  LFTs trending down.  6. 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 Maintaining NSR with PACs.   - Continue amiodarone per tube. - Heparin gtt currently while chest open.   7.  Possible small right lower lobe PE: Chest CT 4/14 w/ small RLL PE.  Bilateral Venous Dopplers negative for DVT.  - Heparin gtt has been started.   - Anticipate switching eliquis prior to d/c.  8. T2DM: Hgb A1c 7.7 - Remains on insulin 9. Pericardial tamponade: 4/23 return to OR for clot removal, chest wall left open. Patient had been on heparin gtt for DCCV of atrial flutter.  - Timing of chest closure per surgery.  10. Ileus: Improving, on Reglan. TFs ongoing.  - Continue Reglan 5 mg IV every 8 hrs.  11. Neuro: Following commands.  CT head negative. - Limit sedation  12. ID: Has been on vancomycin/cefepime with open chest.  WBCs rising, no fever.  - Will repeat blood/trach aspirate cultures - Send procalcitonin.  - Needs to remove CVL/replace access today.   Mobilize as much as possible, sit up in bed.   Length of Stay: 22  CRITICAL CARE Performed by: Loralie Champagne  Total critical care time: 35 minutes  Critical care time was exclusive of separately billable procedures and treating other patients.  Critical care was necessary to treat or prevent imminent or life-threatening deterioration.  Critical care was time spent personally by me on the following activities: development of treatment plan with patient and/or surrogate as well as nursing, discussions with consultants, evaluation of patient's response  to treatment, examination of patient, obtaining history from patient or surrogate, ordering and performing treatments and interventions, ordering and review of laboratory studies, ordering and review of radiographic studies, pulse oximetry and re-evaluation of patient's condition.   Loralie Champagne 08/31/2019 8:01 AM

## 2019-08-31 NOTE — Progress Notes (Signed)
2 Days Post-Op Procedure(s) (LRB): CHEST WOUND WASHOUT POST OPERATIVE OPEN HEART (N/A) Tracheostomy Wound Vac change Subjective: No complaints; working with PT Objective: Vital signs in last 24 hours: Temp:  [98.6 F (37 C)-99.2 F (37.3 C)] 98.6 F (37 C) (05/06 0730) Pulse Rate:  [80-144] 139 (05/06 0700) Cardiac Rhythm: Sinus tachycardia (05/06 0400) Resp:  [12-34] 23 (05/06 0700) BP: (86-126)/(40-47) 86/47 (05/06 0006) SpO2:  [98 %-100 %] 100 % (05/06 0700) Arterial Line BP: (85-151)/(38-66) 110/52 (05/06 0700) FiO2 (%):  [30 %-35 %] 30 % (05/06 0445) Weight:  [93.1 kg] 93.1 kg (05/06 0500)  Hemodynamic parameters for last 24 hours: CVP:  [2 mmHg-17 mmHg] 2 mmHg  Intake/Output from previous day: 05/05 0701 - 05/06 0700 In: 1908.4 [I.V.:438.6; NG/GT:1270; IV Piggyback:199.8] Out: 3085 [Drains:50; Chest Tube:70] Intake/Output this shift: Total I/O In: 88.1 [I.V.:23.1; NG/GT:65] Out: 101 [Other:101]  General appearance: alert and cooperative Neurologic: intact Heart: regular rate and rhythm, S1, S2 normal, no murmur, click, rub or gallop Lungs: clear to auscultation bilaterally Abdomen: soft, non-tender; bowel sounds normal; no masses,  no organomegaly Extremities: edema 1+ Wound: dressed, dry/intact  Lab Results: Recent Labs    08/30/19 0322 08/31/19 0353  WBC 19.9* 27.3*  HGB 7.8* 7.9*  HCT 24.3* 25.0*  PLT 247 298   BMET:  Recent Labs    08/30/19 1605 08/31/19 0353  NA 136 135  K 3.9 4.0  CL 101 104  CO2 21* 23  GLUCOSE 154* 138*  BUN 46* 47*  CREATININE 2.29* 2.35*  CALCIUM 8.5* 8.3*    PT/INR: No results for input(s): LABPROT, INR in the last 72 hours. ABG    Component Value Date/Time   PHART 7.513 (H) 08/20/2019 0404   HCO3 27.9 08/20/2019 0404   TCO2 29 08/20/2019 0404   ACIDBASEDEF 5.0 (H) 08/18/2019 2341   O2SAT 46.6 08/31/2019 0352   CBG (last 3)  Recent Labs    08/30/19 2336 08/31/19 0351 08/31/19 0751  GLUCAP 139* 122* 131*     Assessment/Plan: S/P Procedure(s) (LRB): CHEST WOUND WASHOUT POST OPERATIVE OPEN HEART (N/A) Tracheostomy Wound Vac change Mobilize Diuresis WBC 27--absolutely needs Rt IJ out. He is ok for a PICC   LOS: 22 days    Christian [name] 08/31/2019

## 2019-08-31 NOTE — Progress Notes (Addendum)
IV Team unable to place PICC line due to strong concern for infection and vasculature issues:  Due to incision on left arm, IV team has concerns with placing a PICC in left arm of patient as the PICC line will get infected if the incision gets an infection.  IV team also stated patient has very difficult vasculature for PICC line placement.  Due to current lines on right side subclavian and IJ and concern for possible infection, IV team has concerns with placing a PICC line as the line will scrape over current lines in patient vasculature and will become infected.  IV team RN stated normally PICC lines aren't placed until after blood cultures result per infectious disease protocol.    IV team to place an extended dwell IV and a midline IV for IV access to allow for removal of right IJ introducer if MD approves same.  IV team stated if in 48 hours after IJ line removed the PICC is still needed they would likely be able to place in right arm.    Per conversations with MD Orvan Seen and MD Mclean in morning rounds, original plan of care on 08/30/2019 was for peripheral IV's to be placed so right IJ introducer could be removed & that RN would just do daily CVPs on purple port of HD catheter but IV team was unable to place peripheral IV.  Unclear per notes if midline or extended dwell IV was attempted.    Paged MD Orvan Seen and MD updated on plan for IV access.

## 2019-08-31 NOTE — Progress Notes (Signed)
Pharmacy Antibiotic Note  Christian [name] is a 74 y.o. male admitted on 08/09/2019 with open chest.  Pharmacy has been consulted for Vancomycin and Cefepime dosing. CRRT continues. 5/5 VT 30 > goal will hold dose tonight and restart at lower dose Has been back and forth to OR for would washout - CRRT stops so vancomycin may have accumulated some   Plan: Decrease Vancomycin 1GM IV every 24 hours - start tomorrow night Continue Cefepime to 2g IV every 12 hours. Follow-up toleration of CRRT and Nephrology plans.    Height: 5\' 9"  (175.3 cm) Weight: 93.1 kg (205 lb 4 oz) IBW/kg (Calculated) : 70.7  Temp (24hrs), Avg:98.8 F (37.1 C), Min:98.6 F (37 C), Max:99.2 F (37.3 C)  Recent Labs  Lab 08/24/19 1837 08/25/19 0326 08/28/19 0849 08/28/19 1631 08/29/19 0323 08/29/19 0635 08/29/19 1816 08/30/19 0322 08/30/19 1605 08/30/19 2107 08/31/19 0353  WBC  --    < > 17.4*  --  18.0* 19.1*  --  19.9*  --   --  27.3*  CREATININE  --    < >  --    < > 2.46*  --  2.68* 2.33* 2.29*  --  2.35*  VANCOTROUGH  --   --   --   --   --   --   --   --   --  30*  --   VANCORANDOM 16  --   --   --   --   --   --   --   --   --   --    < > = values in this interval not displayed.    Estimated Creatinine Clearance: 32 mL/min (A) (by C-G formula based on SCr of 2.35 mg/dL (H)).    No Known Allergies  Antimicrobials this admission: Vancomycin 4/23 >> Cefepime 4/23 >>  Dose Adjustments: -vanc 2 g x 1 and cefepime 2>1g q24h for open chest 4/23> 2gm q12 for CRRT -VR 20 - redose 1 g 4/25 -VR 22 - no dose on 4/27 -4/29 pre crrt VR  16 >vanc 1250mg  q24 5/5 VT 30 will hold dose tonight and drop to 1gm q24 start 5/6 pm  Microbiology results: 4/22 BCx x 2 > Neg 5/2 TA: ng>reincubate>nf 5/2 bld x 2: ng4d   Bonnita Nasuti Pharm.D. CPP, BCPS Clinical Pharmacist [phone] 08/31/2019 11:29 AM

## 2019-08-31 NOTE — Progress Notes (Signed)
ANTICOAGULATION CONSULT NOTE - Follow Up Consult  Pharmacy Consult for heparin Indication: pulmonary embolus  Labs: Recent Labs    08/29/19 0635 08/29/19 1816 08/30/19 0322 08/30/19 0323 08/30/19 1605 08/31/19 0353 08/31/19 0913 08/31/19 1729 08/31/19 2100  HGB 7.7*  --  7.8*  --   --  7.9*  --   --   --   HCT 24.8*  --  24.3*  --   --  25.0*  --   --   --   PLT 229  --  247  --   --  298  --   --   --   HEPARINUNFRC  --   --   --    < >  --  0.12* 0.10*  --  0.19*  CREATININE  --    < > 2.33*   < > 2.29* 2.35*  --  2.35*  --    < > = values in this interval not displayed.    Assessment: 75yo male subtherapeutic on heparin. No gtt issues or signs of bleeding per RN.  Hemoglobin stable at 7.9. S/p trach and chest closure yesterday 5/4.  Heparin level slightly below goal this PM at 0.19, no known issues with IV infusion.   Goal of Therapy:  Heparin level 0.2-0.3 units/ml   Plan:  Increase IV heparin to 1000 units/hr. Recheck heparin level with am labs -Lower goal due to open chest   Alanda Slim, PharmD, [facility] Clinical Pharmacist Please see AMION for all Pharmacists' Contact Phone Numbers 08/31/2019, 9:56 PM

## 2019-08-31 NOTE — Progress Notes (Signed)
Pt taken off of trach collar and placed back on full support settings on the ventilator. Per pt he is getting tired. RT will continue to monitor.

## 2019-08-31 NOTE — Progress Notes (Signed)
Patient ID: Christian [name], male   DOB: 02-22-1946, 74 y.o.   MRN: [id number] S: Became hypotensive again last night now to start levophed.   O:BP (!) 86/47   Pulse 97   Temp 98.6 F (37 C) (Oral)   Resp (!) 25   Ht _0  (1.753 m)   Wt 93.1 kg   SpO2 100%   BMI 30.31 kg/m   Intake/Output Summary (Last 24 hours) at 08/31/2019 0903 Last data filed at 08/31/2019 0800 Gross per 24 hour  Intake 1866.11 ml  Output 3002 ml  Net -1135.89 ml   Intake/Output: I/O last 3 completed shifts: In: 3071.4 [I.V.:809.1; NG/GT:1720; IV Piggyback:542.3] Out: 4835 [Drains:260; AYTKZ:6010; Chest Tube:120]  Intake/Output this shift:  Total I/O In: 88.1 [I.V.:23.1; NG/GT:65] Out: 101 [Other:101] Weight change: 1.2 kg Gen: intubated but awake and alert, NAD CVS: RRR Resp: cta Abd: +BS, distended, NT Ext: 1+ edema  Recent Labs  Lab 08/28/19 0424 08/28/19 1631 08/29/19 0323 08/29/19 1816 08/30/19 0322 08/30/19 1605 08/31/19 0353  NA 137 138 136 138 136 136 135  K 4.7 4.5 4.0 4.0 4.4 3.9 4.0  CL 101 102 100 105 102 101 104  CO2 _1 21* 21* 21* 23  GLUCOSE 112* 131* 158* 131* 135* 154* 138*  BUN 44* 44* 48* 45* 47* 46* 47*  CREATININE 2.43* 2.46* 2.46* 2.68* 2.33* 2.29* 2.35*  ALBUMIN 2.1* 2.2* 2.3* 2.2* 2.3* 2.2* 2.3*  CALCIUM 8.8* 8.9 8.7* 8.3* 8.7* 8.5* 8.3*  PHOS 3.9 3.8 3.4 4.1 3.7 2.8 3.0   Liver Function Tests: Recent Labs  Lab 08/30/19 0322 08/30/19 1605 08/31/19 0353  ALBUMIN 2.3* 2.2* 2.3*   No results for input(s): LIPASE, AMYLASE in the last 168 hours. No results for input(s): AMMONIA in the last 168 hours. CBC: Recent Labs  Lab 08/28/19 0849 08/28/19 0849 08/29/19 0323 08/29/19 0323 08/29/19 0635 08/30/19 0322 08/31/19 0353  WBC 17.4*   < > 18.0*   < > 19.1* 19.9* 27.3*  NEUTROABS  --   --  14.9*  --   --  17.1* 22.0*  HGB 8.8*   < > 7.7*   < > 7.7* 7.8* 7.9*  HCT 28.5*   < > 24.8*   < > 24.8* 24.3* 25.0*  MCV 96.0  --  96.1  --  96.1 95.3 96.9  PLT  249   < > 224   < > 229 247 298   < > = values in this interval not displayed.   Cardiac Enzymes: No results for input(s): CKTOTAL, CKMB, CKMBINDEX, TROPONINI in the last 168 hours. CBG: Recent Labs  Lab 08/30/19 1533 08/30/19 2000 08/30/19 2336 08/31/19 0351 08/31/19 0751  GLUCAP 135* 121* 139* 122* 131*    Iron Studies: No results for input(s): IRON, TIBC, TRANSFERRIN, FERRITIN in the last 72 hours. Studies/Results: DG Chest 1 View  Result Date: 08/31/2019 CLINICAL DATA:  Intubated, worsening leukocytosis EXAM: CHEST  1 VIEW COMPARISON:  08/29/2019 chest radiograph. FINDINGS: Tracheostomy tube tip overlies the tracheal air column at the thoracic inlet. Enteric tube enters stomach with the tip not seen on this image. Right subclavian central venous catheter terminates over the cavoatrial junction. Right internal jugular central venous sheath terminates in the right brachiocephalic vein. Stable bilateral chest tubes and mediastinal drain. Stable cardiomediastinal silhouette with mild cardiomegaly. No pneumothorax. No pleural effusion. No pulmonary edema. New hazy left lung base opacity. Similar minimal patchy opacity in the peripheral right mid lung. IMPRESSION: 1. Well-positioned support  structures. No pneumothorax. 2. New hazy left lung base opacity, favor atelectasis, cannot exclude aspiration or pneumonia. 3. Stable minimal patchy opacity in the peripheral right mid lung. Electronically Signed   By: Ilona Sorrel M.D.   On: 08/31/2019 08:50   DG Abd Portable 1V  Result Date: 08/30/2019 CLINICAL DATA:  Feeding tube placement. EXAM: PORTABLE ABDOMEN - 1 VIEW COMPARISON:  08/28/2019 FINDINGS: 0855 hours. The tip of the feeding tube is in the distal duodenum near the ligament of Treitz. Diffuse gaseous bowel distension again noted. IMPRESSION: Feeding tube tip is in the distal duodenum. Electronically Signed   By: Misty Stanley M.D.   On: 08/30/2019 10:31   [place] EKG SITE RITE  Result Date:  08/31/2019 If Site Rite image not attached, placement could not be confirmed due to current cardiac rhythm.  . sodium chloride   Intravenous Once  . sodium chloride   Intravenous Once  . amiodarone  200 mg Per Tube BID  . aspirin  81 mg Per Tube Daily  . atorvastatin  40 mg Per Tube Daily  . B-complex with vitamin C  1 tablet Per Tube Daily  . bisacodyl  10 mg Oral Daily   Or  . bisacodyl  10 mg Rectal Daily  . chlorhexidine gluconate (MEDLINE KIT)  15 mL Mouth Rinse BID  . Chlorhexidine Gluconate Cloth  6 each Topical Daily  . clopidogrel  75 mg Per Tube Daily  . docusate  100 mg Per Tube BID  . feeding supplement (PRO-STAT SUGAR FREE 64)  60 mL Per Tube BID  . insulin aspart  0-24 Units Subcutaneous Q4H  . mouth rinse  15 mL Mouth Rinse 10 times per day  . metoCLOPramide (REGLAN) injection  5 mg Intravenous Q8H  . midodrine  10 mg Oral TID WC  . pantoprazole sodium  40 mg Per Tube Daily  . sodium chloride flush  10-40 mL Intracatheter Q12H  . sodium chloride flush  3 mL Intravenous Q12H  . trospium  20 mg Per Tube BID    BMET    Component Value Date/Time   NA 135 08/31/2019 0353   NA 141 06/19/2019 1130   K 4.0 08/31/2019 0353   CL 104 08/31/2019 0353   CO2 23 08/31/2019 0353   GLUCOSE 138 (H) 08/31/2019 0353   BUN 47 (H) 08/31/2019 0353   BUN 21 06/19/2019 1130   CREATININE 2.35 (H) 08/31/2019 0353   CALCIUM 8.3 (L) 08/31/2019 0353   GFRNONAA 27 (L) 08/31/2019 0353   GFRAA 31 (L) 08/31/2019 0353   CBC    Component Value Date/Time   WBC 27.3 (H) 08/31/2019 0353   RBC 2.58 (L) 08/31/2019 0353   HGB 7.9 (L) 08/31/2019 0353   HCT 25.0 (L) 08/31/2019 0353   PLT 298 08/31/2019 0353   MCV 96.9 08/31/2019 0353   MCH 30.6 08/31/2019 0353   MCHC 31.6 08/31/2019 0353   RDW 17.8 (H) 08/31/2019 0353   LYMPHSABS 1.5 08/31/2019 0353   MONOABS 2.1 (H) 08/31/2019 0353   EOSABS 0.3 08/31/2019 0353   BASOSABS 0.2 (H) 08/31/2019 0353    Assessment/Plan:  1. AKI- following  cardiac cath, cabg, cardiogenic shock, cardiac arrest due to tamponade, and acute on chronic CHF. Started CRRT on 08/24/19 due to anasarca and need for volume removal to close his open chest. 1. Continue with CRRT 1. Volume improved, will decrease UF goal to keep even to -50 ml/hr due to dropping BP and CVP of 6. 2. CAD  s/p CABG x 4 complicated by cardiac tamponade s/p reopened chest urgently on 08/18/19 with wound vac. 1. Hopefully will be able to close chest wound soon but need to cont with UF to help with edema of chest. 2. Acute systolic heart failure due to ischemic cardiomyopathy- volume markedly improved with CRRT. 3. Postoperative acute hypoxic respiratory failure- reintubated 08/18/19 and likely aspiration. 4. Anemia- stableand transfuse prn. 5. Ileus- had BM last night but still distended and tense. Per primary. 6. ID- rising WBC agree with removal of RIJ, ok for picc line.  HD cath placed 08/24/19 and will need to be changed early next week or sooner if blood cultures +.   Donetta Potts, MD [employer] [phone]

## 2019-08-31 NOTE — Progress Notes (Signed)
Nutrition Follow-up  DOCUMENTATION CODES:   Not applicable  INTERVENTION:   Continue tube Feeding:  -Vital 1.5 at 50 ml/hr via post-pyloric Cortrak (1200 ml) -Pro-Stat 60 mL TID  Provides 2400 kcals, 171 g of protein and 917 mL of free water Meets 100% estimated needs  Continue B-complex with C while on CRRT  NUTRITION DIAGNOSIS:   Increased nutrient needs related to post-op healing as evidenced by estimated needs.  Ongoing  GOAL:   Patient will meet greater than or equal to 90% of their needs  Addressed via TF  MONITOR:   Vent status, Skin, TF tolerance, Weight trends, I & O's, Labs  REASON FOR ASSESSMENT:   Ventilator    ASSESSMENT:   Patient with PMH significant for CAD s/p stenting, HTN, HLD, DM, and BPH. Presents this admission with RLL PE and for CABG.   4/14- admitted  4/20- CABG x 4, chest left open 4/22- chest closure, application of incisional wound vac 4/23- extubated, cardiac arrest, chest re-opened for washout, clot removed, re-Intubated 4/26- return to OR for washout, cortrak placed, TF initiated 4/29- chest washout, application of wound VAC, CRRT initiated 5/5- chest washout, tracheostomy  Pt discussed during ICU rounds and with RN.   Requiring low dose pressor. Tolerated trach collar all day yesterday. Remains on CRRT-kept even. Tolerating TF since Cortrak advancement. Change formula to better meet needs.   Admission weight: 97 kg  Current weight: 93.1 kg   I/O: -13,596 ml since 4/22 Wound VAC: 50 ml x 24 hrs  CRRT: 1,192 ml x 24 hrs  Chest tubes: 70 ml x 24 hrs   Drips: levophed Medications: b complex with vitamin C, dulcolax, colace, SS novolog, 5 mg reglan TID Labs: Phosphorus 2.9 (H) CBG 95-154   Diet Order:   Diet Order            Diet NPO time specified  Diet effective now              EDUCATION NEEDS:   Not appropriate for education at this time  Skin:  Skin Assessment: Skin Integrity Issues: Skin Integrity  Issues:: Stage II, Wound VAC, Incisions Stage II: upper lip Wound Vac: open chest Incisions: L arm Other: n/a  Last BM:  5/6  Height:   Ht Readings from Last 1 Encounters:  08/28/19 5\' 9"  (1.753 m)    Weight:   Wt Readings from Last 1 Encounters:  08/31/19 93.1 kg   BMI:  Body mass index is 30.31 kg/m.  Estimated Nutritional Needs:   Kcal:  0569-7948 kcal  Protein:  170-195 grams  Fluid:  >/= 2 L/day   Mariana Single RD, LDN Clinical Nutrition Pager listed in [place]

## 2019-08-31 NOTE — Progress Notes (Signed)
ANTICOAGULATION CONSULT NOTE - Follow Up Consult  Pharmacy Consult for heparin Indication: pulmonary embolus  Labs: Recent Labs    08/29/19 0323 08/29/19 0635 08/29/19 1816 08/30/19 0322 08/30/19 0323 08/30/19 1605 08/31/19 0353 08/31/19 0913  HGB  --  7.7*  --  7.8*  --   --  7.9*  --   HCT  --  24.8*  --  24.3*  --   --  25.0*  --   PLT  --  229  --  247  --   --  298  --   HEPARINUNFRC   < >  --   --   --  0.20*  --  0.12* 0.10*  CREATININE  --   --    < > 2.33*  --  2.29* 2.35*  --    < > = values in this interval not displayed.    Assessment: 74yo male subtherapeutic on heparin. No gtt issues or signs of bleeding per RN.  Hemoglobin stable at 7.9. S/p trach and chest closure yesterday 5/4. Heparin level slightly below goal this AM at 0.12, confirmed with RN, no known issues with IV infusion, redrew lab to confirm = 0.1.  Discussed with Dr. Orvan Seen, okay to titrate up heparin.  Goal of Therapy:  Heparin level 0.2-0.3 units/ml   Plan:  Increase IV heparin to 950 units/hr. Recheck heparin level in 8 hrs. -Lower goal due to open chest Daily heparin level for now  [place] Crane, Roylene Reason, [place] Pharmacist Phone [phone]  08/31/2019 11:32 AM

## 2019-08-31 NOTE — Evaluation (Signed)
Physical Therapy Evaluation Patient Details Name: Christian [name] MRN: [id number] DOB: 01-Oct-1945 Today's Date: 08/31/2019   History of Present Illness  74 yo admitted with NSTEMI 4/15, RLL PE, 4/20 CABG x 4 remained open with wound VAC with repeated washouts. 4/23 cardiac arrest, 4/ 29 initiated CRRT, vent s/p trach. PMHx: BPH, CAD, DM, HLD, HTN  Clinical Impression  Pt awake on vent with trach. Pt answering questions via nodding and mouthing words. Pt with right inattention but will turn head and visually track when cued and with assist for neck rotation. Pt with noted decreased strength and function of left side UE and LE with pt without significant movement. Pt tolerated full chair position with feet on floor x 10 min. Pt with decreased strength, cognition, mobility, balance and function who will benefit from acute therapy to maximize mobility, safety and independence to decrease burden of care.   Trach 30% fiO2    Follow Up Recommendations LTACH;Supervision/Assistance - 24 hour    Equipment Recommendations  Other (comment)(TBD)    Recommendations for Other Services OT consult     Precautions / Restrictions Precautions Precautions: Fall;Sternal Precaution Comments: CRRT, trach, vent, chest tubes, sternal VAC      Mobility  Bed Mobility Overal bed mobility: Needs Assistance Bed Mobility: Supine to Sit;Sit to Supine     Supine to sit: Total assist Sit to supine: Total assist   General bed mobility comments: utilized foot egress positioning to achieve sitting with max assist to bring trunk off surface in sitting. Pt with tendency for left posterior lean and unable to maintain midline with cues and assist. total +2 to slide toward [facility]  Transfers                 General transfer comment: pt not yet appropriate to attempt  Ambulation/Gait             General Gait Details: unable  Stairs            Wheelchair Mobility    Modified Rankin (Stroke  Patients Only)       Balance Overall balance assessment: Needs assistance   Sitting balance-Leahy Scale: Zero   Postural control: Posterior lean;Left lateral lean                                   Pertinent Vitals/Pain Pain Assessment: No/denies pain    Home Living Family/patient expects to be discharged to:: Private residence Living Arrangements: Spouse/significant other Available Help at Discharge: Family;Available 24 hours/day Type of Home: House         Home Equipment: None      Prior Function Level of Independence: Independent               Hand Dominance        Extremity/Trunk Assessment   Upper Extremity Assessment Upper Extremity Assessment: RUE deficits/detail;LUE deficits/detail RUE Deficits / Details: pt generally weak grossly 2/5 with weak grip and very limited UE movement without assist LUE Deficits / Details: very weak grip grossly 1/5 and no ArOM of the UE throughout session    Lower Extremity Assessment Lower Extremity Assessment: RLE deficits/detail;LLE deficits/detail RLE Deficits / Details: grossly 3/5 hip flexion and knee extension initially but fatigues very quickly and unable to sustain repeated contraction LLE Deficits / Details: 2-/5 knee extension, no active motion for hip flexion, no active ankle motion    Cervical / Trunk Assessment Cervical /  Trunk Assessment: (tendency for left neck rotation and left lean in sitting)  Communication   Communication: Tracheostomy  Cognition Arousal/Alertness: Awake/alert Behavior During Therapy: WFL for tasks assessed/performed Overall Cognitive Status: Difficult to assess                                 General Comments: pt with right inattention visually and left decreased strength UE and LE      General Comments      Exercises General Exercises - Lower Extremity Long Arc QuadSinclair Ship;Right;Left;Seated;10 reps;PROM(AAROM on RLE with progressive assist. PROm  on LLE) Hip Flexion/Marching: AAROM;PROM;Right;Left;Seated;5 reps(AAROM on RLE, PROM on LLE)   Assessment/Plan    PT Assessment Patient needs continued PT services  PT Problem List Decreased strength;Decreased mobility;Decreased safety awareness;Decreased activity tolerance;Decreased range of motion;Decreased coordination;Decreased balance;Decreased knowledge of use of DME;Decreased skin integrity;Cardiopulmonary status limiting activity;Decreased cognition       PT Treatment Interventions DME instruction;Therapeutic exercise;Gait training;Cognitive remediation;Functional mobility training;Therapeutic activities;Patient/family education;Balance training;Neuromuscular re-education    PT Goals (Current goals can be found in the Care Plan section)  Acute Rehab PT Goals Patient Stated Goal: pt agreeable to progress mobility PT Goal Formulation: With patient Time For Goal Achievement: 09/14/19 Potential to Achieve Goals: Fair    Frequency Min 3X/week   Barriers to discharge        Co-evaluation               AM-PAC PT "6 Clicks" Mobility  Outcome Measure Help needed turning from your back to your side while in a flat bed without using bedrails?: Total Help needed moving from lying on your back to sitting on the side of a flat bed without using bedrails?: Total Help needed moving to and from a bed to a chair (including a wheelchair)?: Total Help needed standing up from a chair using your arms (e.g., wheelchair or bedside chair)?: Total Help needed to walk in hospital room?: Total Help needed climbing 3-5 steps with a railing? : Total 6 Click Score: 6    End of Session   Activity Tolerance: Patient tolerated treatment well Patient left: in bed;with call bell/phone within reach;with nursing/sitter in room(with wedge under right hip) Nurse Communication: Mobility status;Need for lift equipment;Precautions PT Visit Diagnosis: Other abnormalities of gait and mobility  (R26.89);Muscle weakness (generalized) (M62.81);Other symptoms and signs involving the nervous system (R29.898)    Time: 0801-0829 PT Time Calculation (min) (ACUTE ONLY): 28 min   Charges:   PT Evaluation $PT Eval High Complexity: 1 High          Brunetta Newingham P, PT Acute Rehabilitation Services Pager: [phone] Office: [place] Harrel Ferrone 08/31/2019, 12:48 PM

## 2019-09-01 ENCOUNTER — Inpatient Hospital Stay (HOSPITAL_COMMUNITY): Payer: Medicare Other

## 2019-09-01 DIAGNOSIS — Z951 Presence of aortocoronary bypass graft: Secondary | ICD-10-CM | POA: Diagnosis not present

## 2019-09-01 DIAGNOSIS — R6521 Severe sepsis with septic shock: Secondary | ICD-10-CM

## 2019-09-01 DIAGNOSIS — A419 Sepsis, unspecified organism: Secondary | ICD-10-CM

## 2019-09-01 LAB — HEMOGLOBIN AND HEMATOCRIT, BLOOD
HCT: 19.4 % — ABNORMAL LOW (ref 39.0–52.0)
Hemoglobin: 5.9 g/dL — CL (ref 13.0–17.0)

## 2019-09-01 LAB — CULTURE, BLOOD (ROUTINE X 2)
Culture: NO GROWTH
Culture: NO GROWTH
Special Requests: ADEQUATE

## 2019-09-01 LAB — CBC WITH DIFFERENTIAL/PLATELET
Abs Immature Granulocytes: 2.84 10*3/uL — ABNORMAL HIGH (ref 0.00–0.07)
Basophils Absolute: 0.3 10*3/uL — ABNORMAL HIGH (ref 0.0–0.1)
Basophils Relative: 1 %
Eosinophils Absolute: 0.4 10*3/uL (ref 0.0–0.5)
Eosinophils Relative: 1 %
HCT: 23.9 % — ABNORMAL LOW (ref 39.0–52.0)
Hemoglobin: 7.4 g/dL — ABNORMAL LOW (ref 13.0–17.0)
Immature Granulocytes: 9 %
Lymphocytes Relative: 6 %
Lymphs Abs: 2.1 10*3/uL (ref 0.7–4.0)
MCH: 30.3 pg (ref 26.0–34.0)
MCHC: 31 g/dL (ref 30.0–36.0)
MCV: 98 fL (ref 80.0–100.0)
Monocytes Absolute: 2.3 10*3/uL — ABNORMAL HIGH (ref 0.1–1.0)
Monocytes Relative: 7 %
Neutro Abs: 25.7 10*3/uL — ABNORMAL HIGH (ref 1.7–7.7)
Neutrophils Relative %: 76 %
Platelets: 265 10*3/uL (ref 150–400)
RBC: 2.44 MIL/uL — ABNORMAL LOW (ref 4.22–5.81)
RDW: 18.6 % — ABNORMAL HIGH (ref 11.5–15.5)
WBC: 33.6 10*3/uL — ABNORMAL HIGH (ref 4.0–10.5)
nRBC: 1.8 % — ABNORMAL HIGH (ref 0.0–0.2)

## 2019-09-01 LAB — GLUCOSE, CAPILLARY
Glucose-Capillary: 153 mg/dL — ABNORMAL HIGH (ref 70–99)
Glucose-Capillary: 206 mg/dL — ABNORMAL HIGH (ref 70–99)
Glucose-Capillary: 207 mg/dL — ABNORMAL HIGH (ref 70–99)
Glucose-Capillary: 208 mg/dL — ABNORMAL HIGH (ref 70–99)
Glucose-Capillary: 226 mg/dL — ABNORMAL HIGH (ref 70–99)
Glucose-Capillary: 230 mg/dL — ABNORMAL HIGH (ref 70–99)
Glucose-Capillary: 253 mg/dL — ABNORMAL HIGH (ref 70–99)

## 2019-09-01 LAB — RENAL FUNCTION PANEL
Albumin: 2.2 g/dL — ABNORMAL LOW (ref 3.5–5.0)
Anion gap: 15 (ref 5–15)
BUN: 56 mg/dL — ABNORMAL HIGH (ref 8–23)
CO2: 22 mmol/L (ref 22–32)
Calcium: 8.3 mg/dL — ABNORMAL LOW (ref 8.9–10.3)
Chloride: 101 mmol/L (ref 98–111)
Creatinine, Ser: 2.57 mg/dL — ABNORMAL HIGH (ref 0.61–1.24)
GFR calc Af Amer: 28 mL/min — ABNORMAL LOW (ref >60)
GFR calc non Af Amer: 24 mL/min — ABNORMAL LOW (ref >60)
Glucose, Bld: 209 mg/dL — ABNORMAL HIGH (ref 70–99)
Phosphorus: 3.1 mg/dL (ref 2.5–4.6)
Potassium: 4.3 mmol/L (ref 3.5–5.1)
Sodium: 138 mmol/L (ref 135–145)

## 2019-09-01 LAB — COOXEMETRY PANEL
Carboxyhemoglobin: 0.8 % (ref 0.5–1.5)
Carboxyhemoglobin: 1.2 % (ref 0.5–1.5)
Methemoglobin: 0.4 % (ref 0.0–1.5)
Methemoglobin: 1.1 % (ref 0.0–1.5)
O2 Saturation: 39.4 %
O2 Saturation: 55.6 %
Total hemoglobin: 7.8 g/dL — ABNORMAL LOW (ref 12.0–16.0)
Total hemoglobin: 6.9 g/dL — CL (ref 12.0–16.0)

## 2019-09-01 LAB — MAGNESIUM: Magnesium: 2.7 mg/dL — ABNORMAL HIGH (ref 1.7–2.4)

## 2019-09-01 LAB — HEPARIN LEVEL (UNFRACTIONATED): Heparin Unfractionated: 0.35 IU/mL (ref 0.30–0.70)

## 2019-09-01 MED ORDER — FLUCONAZOLE IN SODIUM CHLORIDE 400-0.9 MG/200ML-% IV SOLN
800.0000 mg | Freq: Once | INTRAVENOUS | Status: AC
  Administered 2019-09-01: 800 mg via INTRAVENOUS
  Filled 2019-09-01: qty 400

## 2019-09-01 MED ORDER — SODIUM CHLORIDE 0.9 % IV SOLN
1.0000 g | Freq: Three times a day (TID) | INTRAVENOUS | Status: DC
  Administered 2019-09-01: 1 g via INTRAVENOUS
  Filled 2019-09-01 (×3): qty 1

## 2019-09-01 MED ORDER — FLUCONAZOLE IN SODIUM CHLORIDE 400-0.9 MG/200ML-% IV SOLN
400.0000 mg | Freq: Once | INTRAVENOUS | Status: DC
  Filled 2019-09-01: qty 200

## 2019-09-01 MED ORDER — ALBUMIN HUMAN 5 % IV SOLN
25.0000 g | Freq: Once | INTRAVENOUS | Status: AC
  Administered 2019-09-01: 25 g via INTRAVENOUS
  Filled 2019-09-01: qty 500

## 2019-09-01 MED ORDER — ALPRAZOLAM 0.25 MG PO TABS
0.2500 mg | ORAL_TABLET | Freq: Two times a day (BID) | ORAL | Status: DC | PRN
  Administered 2019-09-02 – 2019-09-17 (×11): 0.25 mg
  Filled 2019-09-01 (×13): qty 1

## 2019-09-01 MED ORDER — MIDAZOLAM HCL 2 MG/2ML IJ SOLN
2.0000 mg | Freq: Once | INTRAMUSCULAR | Status: AC
  Administered 2019-09-01: 2 mg via INTRAVENOUS

## 2019-09-01 MED ORDER — SODIUM CHLORIDE 0.9% IV SOLUTION: Freq: Once | INTRAVENOUS | Status: AC

## 2019-09-01 MED ORDER — SODIUM CHLORIDE 0.9 % IV BOLUS
1000.0000 mL | Freq: Once | INTRAVENOUS | Status: AC
  Administered 2019-09-01: 1000 mL via INTRAVENOUS

## 2019-09-01 MED ORDER — HEPARIN SODIUM (PORCINE) 1000 UNIT/ML DIALYSIS
1000.0000 [IU] | INTRAMUSCULAR | Status: DC | PRN
  Administered 2019-09-01 – 2019-09-04 (×2): 2800 [IU] via INTRAVENOUS_CENTRAL
  Filled 2019-09-01: qty 6
  Filled 2019-09-01: qty 2
  Filled 2019-09-01: qty 6
  Filled 2019-09-01: qty 4
  Filled 2019-09-01: qty 1
  Filled 2019-09-01: qty 3
  Filled 2019-09-01: qty 2
  Filled 2019-09-01: qty 6

## 2019-09-01 MED ORDER — FENTANYL CITRATE (PF) 100 MCG/2ML IJ SOLN
INTRAMUSCULAR | Status: AC
  Filled 2019-09-01: qty 2

## 2019-09-01 MED ORDER — FENTANYL CITRATE (PF) 100 MCG/2ML IJ SOLN
50.0000 ug | Freq: Once | INTRAMUSCULAR | Status: AC
  Administered 2019-09-01: 50 ug via INTRAVENOUS

## 2019-09-01 MED ORDER — VASOPRESSIN 20 UNIT/ML IV SOLN
0.0300 [IU]/min | INTRAVENOUS | Status: DC
  Administered 2019-09-01 – 2019-09-02 (×2): 0.03 [IU]/min via INTRAVENOUS
  Filled 2019-09-01 (×2): qty 2

## 2019-09-01 MED ORDER — DOBUTAMINE IN D5W 4-5 MG/ML-% IV SOLN
5.0000 ug/kg/min | INTRAVENOUS | Status: DC
  Administered 2019-09-01: 5 ug/kg/min via INTRAVENOUS
  Filled 2019-09-01: qty 250

## 2019-09-01 MED ORDER — VANCOMYCIN VARIABLE DOSE PER UNSTABLE RENAL FUNCTION (PHARMACIST DOSING): Status: DC

## 2019-09-01 MED ORDER — MIDAZOLAM HCL 2 MG/2ML IJ SOLN
INTRAMUSCULAR | Status: AC
  Filled 2019-09-01: qty 2

## 2019-09-01 MED ORDER — SODIUM CHLORIDE 0.9 % IV SOLN
1.0000 g | INTRAVENOUS | Status: DC
  Administered 2019-09-02: 1 g via INTRAVENOUS
  Filled 2019-09-01: qty 1

## 2019-09-01 NOTE — Progress Notes (Addendum)
NAME:  Christian [name], MRN:  [id number], DOB:  06/29/1945, LOS: 4 ADMISSION DATE:  08/09/2019, CONSULTATION DATE:  08/28/2019 REFERRING MD:  Orvan Seen - TRH, CHIEF COMPLAINT:  Respiratory failure.    HPI/course in hospital   74 year old man who presented with progressive dyspnea.  He was admitted 4/14 and was incidentally found to have a right lower lobe pulmonary embolism.  Overall the picture was that of decompensated heart failure with a non-STEMI.  Coronary angiography revealed triple-vessel disease and the patient was referred for cardiac surgery.  EF was found to be reduced at that time at  He underwent four-vessel CABG 4/20 with good quality distal targets.  Cardiac function remained reduced post bypass.  Unable to close chest due to surrounding edema.  Underwent attempted closure with VAC placement 4/22.   Cardiac arrest 4/23 with repeated washouts with VAC placements on 4/26 and 4/29.  Significant volume overload postoperatively.  Acute kidney injury.  Currently on CRRT for fluid removal.  /14/21 Admitted with NSTEM and ? Small RLL PE. Weight 213 pounds.  08/15/19 CABG x4 on 08/15/19 08/18/19 Cardiac arrest due to tamponade. Chest opened for washout 4/26 Chest washout in OR 4/29 CVVH started 5/5 OR for chest washout, tracheostomy   Past Medical History   Past Medical History:  Diagnosis Date  . BPH (benign prostatic hypertrophy)   . Coronary artery disease   . Diabetes mellitus without complication ([place])   . Dry eyes left  . Hiatal hernia   . 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  LIMA TO LAD, RIMA TO PDA, RADIAL ARTERY TO CIRC AND SEQUENTIALLY TO OM1. 08/15/2019  . Hyperlipidemia   . Hypertension   . Incomplete bladder emptying   . Nocturia   . 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 SOB (shortness of breath) on exertion      Past  Surgical History:  Procedure Laterality Date  . APPLICATION OF WOUND VAC N/A 08/24/2019   Procedure: APPLICATION OF WOUND VAC;  Surgeon: [name], MD;  Location: [facility];  Service: Thoracic;  Laterality: N/A;  . APPLICATION OF WOUND VAC  08/29/2019   Procedure: Wound Vac change;  Surgeon: [name], MD;  Location: [facility];  Service: Open Heart Surgery;;  . CARDIAC CATHETERIZATION    . CORONARY ARTERY BYPASS GRAFT N/A 08/15/2019   Procedure: CORONARY ARTERY BYPASS GRAFTING (CABG), x 4 using bilateral IMAs and left radial artery .  LIMA TO LAD, RIMA TO PDA, RADIAL ARTERY TO CIRC AND SEQUENTIALLY TO OM1.;  Surgeon: [name], MD;  Location: [place];  Service: Open Heart Surgery;  Laterality: N/A;  . CORONARY STENT PLACEMENT  02/27/2014   distal rt/pd coronary       dr Einar Gip  . CYSTO/ BLADDER BIOPSY'S/ CAUTHERIZATION  01-14-2004  DR Gaynelle Arabian  . EXPLORATION POST OPERATIVE OPEN HEART N/A 08/16/2019   Procedure: Chest Closure S?P CABG WITH APPLICATION OF PREVENA  INCISIONAL WOUND VAC;  Surgeon: [name], MD;  Location: [facility];  Service: Open Heart Surgery;  Laterality: N/A;  . EXPLORATION POST OPERATIVE OPEN HEART N/A 08/21/2019   Procedure: CHEST WASHOUT S/P OPEN CHEST;  Surgeon: [name], MD;  Location: [place];  Service: Open Heart Surgery;  Laterality: N/A;  Open chest with Esmark dressing with Ioban sealant coverage.  Marland Kitchen  EXPLORATION POST OPERATIVE OPEN HEART N/A 08/18/2019   Procedure: EXPLORATION POST OPERATIVE OPEN HEART (performed 04/23 on unit);  Surgeon: [name], MD;  Location: [place];  Service: Open Heart Surgery;  Laterality: N/A;  . EXPLORATION POST OPERATIVE OPEN HEART N/A 08/24/2019   Procedure: CHEST WASHOUT POST OPERATIVE OPEN HEART;  Surgeon: [name], MD;  Location: [place];  Service: Open Heart Surgery;  Laterality: N/A;  . EXPLORATION POST OPERATIVE OPEN HEART N/A 08/29/2019   Procedure: CHEST WOUND WASHOUT POST OPERATIVE OPEN HEART;   Surgeon: [name], MD;  Location: [place];  Service: Open Heart Surgery;  Laterality: N/A;  . LEFT HEART CATH AND CORONARY ANGIOGRAPHY N/A 08/10/2019   Procedure: LEFT HEART CATH AND CORONARY ANGIOGRAPHY;  Surgeon: Nigel Mormon, MD;  Location: [place] CV LAB;  Service: Cardiovascular;  Laterality: N/A;  . LEFT HEART CATHETERIZATION WITH CORONARY ANGIOGRAM N/A 02/27/2014   Procedure: LEFT HEART CATHETERIZATION WITH CORONARY ANGIOGRAM;  Surgeon: Laverda Page, MD;  Location: [facility] CATH LAB;  Service: Cardiovascular;  Laterality: N/A;  . PERCUTANEOUS CORONARY STENT INTERVENTION (PCI-S)  02/27/2014   Procedure: PERCUTANEOUS CORONARY STENT INTERVENTION (PCI-S);  Surgeon: Laverda Page, MD;  Location: [facility] CATH LAB;  Service: Cardiovascular;;  rt PDA  3.0/28mm Promus stent  . RADIAL ARTERY HARVEST Left 08/15/2019   Procedure: Radial Artery Harvest;  Surgeon: [name], MD;  Location: [place];  Service: Open Heart Surgery;  Laterality: Left;  . TEE WITHOUT CARDIOVERSION N/A 08/15/2019   Procedure: TRANSESOPHAGEAL ECHOCARDIOGRAM (TEE);  Surgeon: [name], MD;  Location: [place];  Service: Open Heart Surgery;  Laterality: N/A;  . TRACHEOSTOMY TUBE PLACEMENT  08/29/2019   Procedure: Tracheostomy;  Surgeon: [name], MD;  Location: [place] [facility];  Service: Open Heart Surgery;;  . TRANSURETHRAL RESECTION OF PROSTATE  04/04/2012   Procedure: TRANSURETHRAL RESECTION OF THE PROSTATE WITH GYRUS INSTRUMENTS;  Surgeon: Ailene Rud, MD;  Location: [place];  Service: Urology;  Laterality: N/A;  . TRANSURETHRAL RESECTION OF PROSTATE N/A 09/27/2014   Procedure: TRANSURETHRAL RESECTION OF THE PROSTATE ;  Surgeon: Carolan Clines, MD;  Location: [facility];  Service: Urology;  Laterality: N/A;  . UPPER GASTROINTESTINAL ENDOSCOPY        Social History   reports that he quit smoking about 29 years ago. His smoking use included cigarettes. He has a 10.00 pack-year  smoking history. He has never used smokeless tobacco. He reports current alcohol use. He reports that he does not use drugs.   Family History   His family history includes Diabetes in his brother, brother, father, and mother; Hypertension in his brother and mother.   Allergies No Known Allergies   Home Medications  Prior to Admission medications   Medication Sig Start Date End Date Taking? Authorizing Provider  aspirin EC 81 MG tablet Take 81 mg by mouth daily.   Yes [provider]  atorvastatin (LIPITOR) 20 MG tablet TAKE 1 TABLET BY MOUTH EVERY DAY IN THE EVENING Patient taking differently: Take 20 mg by mouth every evening.  06/22/19  Yes Adrian Prows, MD  cloNIDine (CATAPRES) 0.2 MG tablet TAKE 1 TABLET (0.2 MG TOTAL) BY MOUTH DAILY. 06/22/19  Yes Adrian Prows, MD  cromolyn (OPTICROM) 4 % ophthalmic solution Place 1 drop into both eyes 4 (four) times daily as needed (dry eyes).  02/02/17  Yes [provider]  empagliflozin (JARDIANCE) 10 MG TABS tablet Take 10 mg by mouth daily before  breakfast. 07/25/19  Yes Marrian Salvage, FNP  furosemide (LASIX) 20 MG tablet TAKE 1 TABLET BY MOUTH EVERY DAY Patient taking differently: Take 20 mg by mouth daily.  08/01/19  Yes Adrian Prows, MD  isosorbide mononitrate (IMDUR) 60 MG 24 hr tablet Take 60 mg by mouth daily.   Yes [provider]  metFORMIN (GLUCOPHAGE) 1000 MG tablet TAKE 1 TABLET TWICE A DAY WITH A MEAL Patient taking differently: Take 1,000 mg by mouth 2 (two) times daily with a meal.  07/31/19  Yes Hoyt Koch, MD  metoprolol succinate (TOPROL-XL) 100 MG 24 hr tablet Take 1 tablet (100 mg total) by mouth daily. Take with or immediately following a meal. 07/24/19  Yes Adrian Prows, MD  nitroGLYCERIN (NITROSTAT) 0.4 MG SL tablet Place 1 tablet (0.4 mg total) under the tongue every 5 (five) minutes as needed for chest pain. 06/01/19  Yes Adrian Prows, MD  olmesartan-hydrochlorothiazide (BENICAR HCT) 40-25 MG tablet  TAKE 1 TABLET BY MOUTH EVERY DAY Patient taking differently: Take 1 tablet by mouth daily.  07/03/19  Yes Adrian Prows, MD  PREVIDENT 5000 BOOSTER PLUS 1.1 % PSTE Take 1 application by mouth daily.  06/14/18  Yes [provider]  silodosin (RAPAFLO) 8 MG CAPS capsule Take 8 mg by mouth at bedtime. 07/27/19  Yes [provider]  spironolactone (ALDACTONE) 25 MG tablet Take 1 tablet (25 mg total) by mouth daily. 06/01/19 08/30/19 Yes Adrian Prows, MD  sucralfate (CARAFATE) 1 GM/10ML suspension Take 10 mLs (1 g total) by mouth 4 (four) times daily -  with meals and at bedtime. 07/24/19  Yes Marrian Salvage, FNP  tamsulosin [facility]) 0.4 MG CAPS capsule Take 0.4 mg by mouth daily after breakfast.  05/20/18  Yes [provider]  Trospium Chloride 60 MG CP24 Take 1 capsule by mouth daily. 07/31/19  Yes [provider]  verapamil (VERELAN PM) 240 MG 24 hr capsule TAKE 1 CAPSULE BY MOUTH EVERY DAY Patient taking differently: Take 240 mg by mouth daily.  07/24/19  Yes Adrian Prows, MD     Interim history/subjective:  Trach placed 5/5 Remains on full vent support Remains on Levo at 16 mcg and Dobutamine at 5 mcg BP remains soft Net negative 16.2 L CT x 4, no leaks detected Chest remains open due to edema. Plan to stop CVVH  5/7 and pull cath Received Dilaudid at 7 am and is less alert today as a result  Objective   Blood pressure (!) 97/54, pulse (!) 108, temperature 97.7 F (36.5 C), resp. rate (!) 24, height 5\' 9"  (1.753 m), weight 98.5 kg, SpO2 100 %. CVP:  [4 mmHg-8 mmHg] 8 mmHg  Vent Mode: PRVC FiO2 (%):  [28 %-35 %] 28 % Set Rate:  [18 bmp] 18 bmp Vt Set:  [560 mL] 560 mL PEEP:  [5 cmH20] 5 cmH20 Plateau Pressure:  [20 BMW41-32 cmH20] 20 cmH20   Intake/Output Summary (Last 24 hours) at 09/01/2019 0848 Last data filed at 09/01/2019 0800 Gross per 24 hour  Intake 2619.74 ml  Output 5173 ml  Net -2553.26 ml   Filed Weights   08/30/19 0600 08/31/19 0500 09/01/19  0451  Weight: 91.9 kg 93.1 kg 98.5 kg    Examination: General- Critically ill appearing older male trached,on ATC very groggy, not interactive this am GMW:NUUVO is clean dry and intact with minimal drainage, No LAD, PERRLA, MM/ stoma  pink and moist, Right IJ HD access Cardiac: S1, S2, regular rate and rhythm,  no murmur Chest: Open chest, VAC dressing in place, edema noted, CT x 4 , No leaks noted, Bilateral chest excursion, Few rhonchi, No wheeze/ rales/ dullness; no accessory muscle use, no nasal flaring, no sternal retractions Abd.: Soft Non-tender, ND, BS + Ext: No clubbing cyanosis, generalized 1+ edema Neuro:  Groggy this am after Dilaudid, not interactive, not following commands Skin: No rashes, no lesions, skin is warm and drywarm and dry Psych: sedated, groggy   Ancillary tests (personally reviewed)  CBC: Recent Labs  Lab 08/29/19 0323 08/29/19 0635 08/30/19 0322 08/31/19 0353 09/01/19 0349  WBC 18.0* 19.1* 19.9* 27.3* 33.6*  NEUTROABS 14.9*  --  17.1* 22.0* 25.7*  HGB 7.7* 7.7* 7.8* 7.9* 7.4*  HCT 24.8* 24.8* 24.3* 25.0* 23.9*  MCV 96.1 96.1 95.3 96.9 98.0  PLT 224 229 247 298 811    Basic Metabolic Panel: Recent Labs  Lab 08/28/19 0424 08/28/19 1631 08/29/19 0323 08/29/19 1816 08/30/19 0322 08/30/19 1605 08/31/19 0353 08/31/19 1729 09/01/19 0349  NA 137   < > 136   < > 136 136 135 138 138  K 4.7   < > 4.0   < > 4.4 3.9 4.0 4.1 4.3  CL 101   < > 100   < > 102 101 104 101 101  CO2 23   < > 23   < > 21* 21* 23 24 22   GLUCOSE 112*   < > 158*   < > 135* 154* 138* 206* 209*  BUN 44*   < > 48*   < > 47* 46* 47* 50* 56*  CREATININE 2.43*   < > 2.46*   < > 2.33* 2.29* 2.35* 2.35* 2.57*  CALCIUM 8.8*   < > 8.7*   < > 8.7* 8.5* 8.3* 8.3* 8.3*  MG 2.9*  --  2.8*  --  2.7*  --  2.9*  --  2.7*  PHOS 3.9   < > 3.4   < > 3.7 2.8 3.0 2.3* 3.1   < > = values in this interval not displayed.   GFR: Estimated Creatinine Clearance: 30.1 mL/min (A) (by C-G formula based  on SCr of 2.57 mg/dL (H)). Recent Labs  Lab 08/29/19 0635 08/30/19 0322 08/31/19 0353 08/31/19 0827 09/01/19 0349  PROCALCITON  --   --   --  4.95  --   WBC 19.1* 19.9* 27.3*  --  33.6*    Liver Function Tests: Recent Labs  Lab 08/30/19 0322 08/30/19 1605 08/31/19 0353 08/31/19 1729 09/01/19 0349  ALBUMIN 2.3* 2.2* 2.3* 2.2* 2.2*   No results for input(s): LIPASE, AMYLASE in the last 168 hours. No results for input(s): AMMONIA in the last 168 hours.  ABG    Component Value Date/Time   PHART 7.513 (H) 08/20/2019 0404   PCO2ART 34.7 08/20/2019 0404   PO2ART 216 (H) 08/20/2019 0404   HCO3 27.9 08/20/2019 0404   TCO2 29 08/20/2019 0404   ACIDBASEDEF 5.0 (H) 08/18/2019 2341   O2SAT 39.4 09/01/2019 0349     Coagulation Profile: No results for input(s): INR, PROTIME in the last 168 hours.  Cardiac Enzymes: No results for input(s): CKTOTAL, CKMB, CKMBINDEX, TROPONINI in the last 168 hours.  HbA1C: Hgb A1c MFr Bld  Date/Time Value Ref Range Status  08/14/2019 07:45 PM 7.7 (H) 4.8 - 5.6 % Final    Comment:    (NOTE) Pre diabetes:          5.7%-6.4% Diabetes:              >  6.4% Glycemic control for   <7.0% adults with diabetes   07/24/2019 03:12 PM 7.7 (H) 4.6 - 6.5 % Final    Comment:    Glycemic Control Guidelines for People with Diabetes:Non Diabetic:  <6%Goal of Therapy: <7%Additional Action Suggested:  >8%     CBG: Recent Labs  Lab 08/31/19 1559 08/31/19 2019 09/01/19 0052 09/01/19 0433 09/01/19 0751  GLUCAP 174* 209* 208* 207* 153*   CXR 08/27/19:  Small left effusion, retrocardiac consolidation. (personal interpretation)  Echo 4/24: EF 45% with Grade 2 DD.   Assessment & Plan:   Critically ill due to prolonged acute hypoxic hypercapnic respiratory failure requiring mechanical ventilation following cardiac surgery. While able to tolerate brief SBT, doubt will be able to protect airway if extubated. Trached 5/4 ATC the last 2 days with vent at  night - Continue Open-ended trach collar trial as tolerated - Monitor saturations - Goal for 24 hours ATC  Possible small right lower lobe PE  Acute decompensated systolic and diastolic heart failure. Now off all vasoactive infusions. Net negative 16 L CVP is 8 Co-ox 39% Remains of Levophed and Dobutamine - Holding ISDN and HDLZ to allow for aggressive fluid removal  - Continue midodrine to facilitate fluid removal  - repeat echo 5/7   Critically ill due to acute kidney injury requiring CRRT to control volume overload and electrolyte imbalances. Slight increase in Creatinine overnight (  Plan 5/7 to stop CVVH and remove HD cath 2/2 WBC 33,000 Line holiday x 24-48 hours then replace cath per Dr. Julien Girt - Continue to UF to remove edema with primary goal to permit definitive chest closure.  - Continue Support BP with low-dose vasopr  Fever/ Elevated WBC T Max 99.2 WBC 33.6 ( 27.3/ 19.9) BP soft 5/7 Blood Cx ( 5/6) pending Baseline PCT 4.96 on 5/6  CXR 5/7>> Airspace opacity left lower lobe with small left pleural effusion. Suspect atelectasis in this area with potential superimposed pneumonia or possible aspiration. Ill-defined opacity in the periphery the right mid lung remains, likely of infectious etiology. No new opacity evident Plan Continue meropenum and vanc. Plan to remove RIJ HD cath and give a line vacation Plan for Dr. Julien Girt to replace HD cath 24-48 hours after line removed Pt. Will need Peripheral strength Levo until HD cath replaced  Trend PCT Trend fever and WBC curve Follow BC results Trend CXR 5/8 and prn  Status post CABG with multiple chest explorations and VAC dressing still in place Edematous>> Fluid removal per CVVHD - For eventual closure at a latter date once edema has resolved. .  Anemia of Critical Illness HGB 7.4 Plan Trend CBC Transfuse for HGB < 7  Generalized frailty.  Weakness of critical illness. [place] Suspect much of the fluid  is now extravascular due to cachexia and immobility Received dose of Dilaudid 5/7>> very slow to recoved - Minimize sedatives to promote interactivity and help regain function. - PT/OT as able  Gastroparesis of acute illness, narcotic use.  - Tolerating TF via OG  Daily Goals Checklist  Pain/Anxiety/Delirium protocol (if indicated): Minimize  intermittent dilaudid only.  VAP protocol (if indicated): bundle in place.  Respiratory support goals: daily SBT Blood pressure target: Keep MAP >65. Start midodrine and use phenylephrine to support BP as fluid removal is the priority. DVT prophylaxis: on IV heparin Nutritional status and feeding goals: high nutritional risk. Move tube into small bowel. Assess nutritional adequacy, target 2g/kg/d protein intake. GI prophylaxis: Pantoprazole Fluid status goals: continue to UF  at -75/h, minimize fluids Urinary catheter: condom  Central lines: right IJ and [place], arterial line Glucose control: currently euglycemic Mobility/therapy needs: will need aggressive therapy. Antibiotic de-escalation: prophylaxis for open chest.  Home medication reconciliation: on hold Daily labs: CBC, BMP Code Status: full  Family Communication: per cardiac surgery. Disposition: ICU   CRITICAL CARE Performed by: Magdalen Spatz   Total critical care APP  time: 18 minutes   Magdalen Spatz, MSN, AGACNP-BC [place] Please see Amion for pager numbers and assignments After 4 pm please call [phone]   09/01/2019, 8:48 AM

## 2019-09-01 NOTE — Progress Notes (Signed)
Spoke with MD Coladonato after new HD line placed to see if CRRT should be restarted.  MD Coladonato advised to wait on restarting CRRT and this will be discussed during morning rounds but if patient has clinical decline that requires CRRT to page for CRRT orders.

## 2019-09-01 NOTE — Progress Notes (Signed)
Patient ID: Christian [name], male   DOB: [birth year], 74 y.o.   MRN: [id number] EVENING ROUNDS NOTE :     [address]       [place] [address]             [phone]                 3 Days Post-Op Procedure(s) (LRB): CHEST WOUND WASHOUT POST OPERATIVE OPEN HEART (N/A) Tracheostomy Wound Vac change  Total Length of Stay:  LOS: 23 days  BP 117/61   Pulse 92   Temp (!) 100.5 F (38.1 C) (Oral)   Resp (!) 30   Ht 5\' 9"  (1.753 m)   Wt 98.5 kg   SpO2 100%   BMI 32.07 kg/m   .Intake/Output      05/07 0701 - 05/08 0700   I.V. (mL/kg) 376.3 (3.8)   NG/GT 955   IV Piggyback 1740.7   Total Intake(mL/kg) 3072 (31.2)   Drains 50   Other 121   Stool 475   Chest Tube 0   Total Output 646   Net +2426       Stool Occurrence 3 x     . sodium chloride Stopped (09/01/19 1008)  . feeding supplement (VITAL 1.5 CAL) 1,000 mL (09/01/19 1456)  . fluconazole (DIFLUCAN) IV 100 mL/hr at 09/01/19 2000  . heparin Stopped (09/01/19 0851)  . lactated ringers 20 mL/hr at 08/20/19 1200  . magnesium sulfate bolus IVPB    . [START ON 09/02/2019] meropenem (MERREM) IV    . norepinephrine (LEVOPHED) Adult infusion 15 mcg/min (09/01/19 2000)  . vasopressin (PITRESSIN) infusion - *FOR SHOCK* 0.03 Units/min (09/01/19 2000)     Lab Results  Component Value Date   WBC 33.6 (H) 09/01/2019   HGB 5.9 (LL) 09/01/2019   HCT 19.4 (L) 09/01/2019   PLT 265 09/01/2019   GLUCOSE 209 (H) 09/01/2019   CHOL 107 08/11/2019   TRIG 71 08/27/2019   HDL 42 08/11/2019   LDLDIRECT 40.0 11/23/2018   LDLCALC 40 08/11/2019   ALT 169 (H) 08/24/2019   AST 59 (H) 08/24/2019   NA 138 09/01/2019   K 4.3 09/01/2019   CL 101 09/01/2019   CREATININE 2.57 (H) 09/01/2019   BUN 56 (H) 09/01/2019   CO2 22 09/01/2019   TSH 2.590 04/19/2019   INR 1.3 (H) 08/19/2019   HGBA1C 7.7 (H) 08/14/2019   Blood ordered by ccm-  With drop in hgb heparin stopped   Grace Shandell MD  [facility]eeper [phone] Office  [phone] 09/01/2019 8:52 PM

## 2019-09-01 NOTE — Progress Notes (Signed)
Patient ID: Christian [name], male   DOB: [birth year], 74 y.o.   MRN: [id number]     Advanced Heart Failure Rounding Note  PCP-Cardiologist: No primary care provider on file.   Subjective:    08/09/19 Admitted with NSTEM and ? Small RLL PE. Weight 213 pounds.  08/15/19 CABG x4 on 08/15/19 08/18/19 Cardiac arrest due to tamponade. Chest opened for washout 4/26 Chest washout in OR 4/29 CVVH started 5/5 OR for chest washout, tracheostomy  Vented with tracheostomy, FiO2 0.3.    Awake, following commands.    Has AKI. Suspect ATN from cardiogenic shock/pericardial tamponade. CVVH initiated 4/29 to assist w/ volume removal. I/O negative for last 24 hrs, weight not accurate.  CVP 8.   He is requiring more pressor support this morning, NE 13.  Co-ox down to 39%.    On vanc/cefepime in setting of open chest. Tm 99.2. WBCs to 34 this morning.    NSR this morning with PACs, remains on heparin gtt.  Amiodarone now po.   Echo 4/25 post-pericardial evacuation: EF 40-45% with some residual pericardial clot.  Objective:   Weight Range: 98.5 kg Body mass index is 32.07 kg/m.   Vital Signs:   Temp:  [96.9 F (36.1 C)-99.2 F (37.3 C)] 99.2 F (37.3 C) (05/07 0435) Pulse Rate:  [11-155] 105 (05/07 0717) Resp:  [19-42] 27 (05/07 0717) BP: (77-147)/(42-81) 94/59 (05/07 0717) SpO2:  [94 %-100 %] 100 % (05/07 0717) Arterial Line BP: (90-132)/(38-52) 105/46 (05/06 1030) FiO2 (%):  [28 %-35 %] 30 % (05/07 0400) Weight:  [98.5 kg] 98.5 kg (05/07 0451) Last BM Date: 08/31/19  Weight change: Filed Weights   08/30/19 0600 08/31/19 0500 09/01/19 0451  Weight: 91.9 kg 93.1 kg 98.5 kg    Intake/Output:   Intake/Output Summary (Last 24 hours) at 09/01/2019 0727 Last data filed at 09/01/2019 0700 Gross per 24 hour  Intake 2634.8 ml  Output 5153 ml  Net -2518.2 ml      Physical Exam   General: NAD Neck: Tracheostomy.  No JVD, no thyromegaly or thyroid nodule.  Lungs: Decreased at bases.  CV:  Chest wall open.  Heart regular S1/S2, no S3/S4, no murmur.  No peripheral edema.   Abdomen: Soft, nontender, no hepatosplenomegaly, mild distention.  Skin: Intact without lesions or rashes.  Neurologic: Alert and oriented x 3.  Extremities: No clubbing or cyanosis.  HEENT: Normal.    Telemetry   NSR 100s with PACs  (personally reviewed)  EKG    N/a   Labs    CBC Recent Labs    08/31/19 0353 09/01/19 0349  WBC 27.3* 33.6*  NEUTROABS 22.0* 25.7*  HGB 7.9* 7.4*  HCT 25.0* 23.9*  MCV 96.9 98.0  PLT 298 165   Basic Metabolic Panel Recent Labs    08/31/19 0353 08/31/19 0353 08/31/19 1729 09/01/19 0349  NA 135   < > 138 138  K 4.0   < > 4.1 4.3  CL 104   < > 101 101  CO2 23   < > 24 22  GLUCOSE 138*   < > 206* 209*  BUN 47*   < > 50* 56*  CREATININE 2.35*   < > 2.35* 2.57*  CALCIUM 8.3*   < > 8.3* 8.3*  MG 2.9*  --   --  2.7*  PHOS 3.0   < > 2.3* 3.1   < > = values in this interval not displayed.   Liver Function Tests Recent Labs  08/31/19 1729 09/01/19 0349  ALBUMIN 2.2* 2.2*   No results for input(s): LIPASE, AMYLASE in the last 72 hours. Cardiac Enzymes No results for input(s): CKTOTAL, CKMB, CKMBINDEX, TROPONINI in the last 72 hours.  BNP: BNP (last 3 results) Recent Labs    08/09/19 1240  BNP 89.1    ProBNP (last 3 results) Recent Labs    11/23/18 1120 08/09/19 0922  PROBNP 18.0 78.0     D-Dimer No results for input(s): DDIMER in the last 72 hours. Hemoglobin A1C No results for input(s): HGBA1C in the last 72 hours. Fasting Lipid Panel No results for input(s): CHOL, HDL, LDLCALC, TRIG, CHOLHDL, LDLDIRECT in the last 72 hours. Thyroid Function Tests No results for input(s): TSH, T4TOTAL, T3FREE, THYROIDAB in the last 72 hours.  Invalid input(s): FREET3  Other results:   Imaging    DG Chest 1 View  Result Date: 08/31/2019 CLINICAL DATA:  Intubated, worsening leukocytosis EXAM: CHEST  1 VIEW COMPARISON:  08/29/2019 chest  radiograph. FINDINGS: Tracheostomy tube tip overlies the tracheal air column at the thoracic inlet. Enteric tube enters stomach with the tip not seen on this image. Right subclavian central venous catheter terminates over the cavoatrial junction. Right internal jugular central venous sheath terminates in the right brachiocephalic vein. Stable bilateral chest tubes and mediastinal drain. Stable cardiomediastinal silhouette with mild cardiomegaly. No pneumothorax. No pleural effusion. No pulmonary edema. New hazy left lung base opacity. Similar minimal patchy opacity in the peripheral right mid lung. IMPRESSION: 1. Well-positioned support structures. No pneumothorax. 2. New hazy left lung base opacity, favor atelectasis, cannot exclude aspiration or pneumonia. 3. Stable minimal patchy opacity in the peripheral right mid lung. Electronically Signed   By: Ilona Sorrel M.D.   On: 08/31/2019 08:50   [place] EKG SITE RITE  Result Date: 08/31/2019 If Site Rite image not attached, placement could not be confirmed due to current cardiac rhythm.    Medications:     Scheduled Medications: . sodium chloride   Intravenous Once  . sodium chloride   Intravenous Once  . amiodarone  200 mg Per Tube BID  . aspirin  81 mg Per Tube Daily  . atorvastatin  40 mg Per Tube Daily  . B-complex with vitamin C  1 tablet Per Tube Daily  . bisacodyl  10 mg Oral Daily   Or  . bisacodyl  10 mg Rectal Daily  . chlorhexidine gluconate (MEDLINE KIT)  15 mL Mouth Rinse BID  . Chlorhexidine Gluconate Cloth  6 each Topical Daily  . clopidogrel  75 mg Per Tube Daily  . docusate  100 mg Per Tube BID  . feeding supplement (PRO-STAT SUGAR FREE 64)  60 mL Per Tube TID  . insulin aspart  0-24 Units Subcutaneous Q4H  . mouth rinse  15 mL Mouth Rinse 10 times per day  . metoCLOPramide (REGLAN) injection  5 mg Intravenous Q8H  . midodrine  10 mg Oral TID WC  . pantoprazole sodium  40 mg Per Tube Daily  . sodium chloride flush  10-40 mL  Intracatheter Q12H  . sodium chloride flush  10-40 mL Intracatheter Q12H  . trospium  20 mg Per Tube BID    Infusions: .  prismasol BGK 4/2.5 500 mL/hr at 08/31/19 2110  . sodium chloride Stopped (09/01/19 0112)  . ceFEPime (MAXIPIME) IV Stopped (09/01/19 0022)  . DOBUTamine    . feeding supplement (VITAL 1.5 CAL) 1,000 mL (08/31/19 1552)  . heparin 1,000 Units/hr (09/01/19 0700)  . lactated  ringers 20 mL/hr at 08/20/19 1200  . magnesium sulfate bolus IVPB    . norepinephrine (LEVOPHED) Adult infusion 13 mcg/min (09/01/19 0700)  . prismasol BGK 0/2.5 300 mL/hr at 09/01/19 0509  . prismasol BGK 4/2.5 1,500 mL/hr at 09/01/19 0559  . vancomycin Stopped (08/31/19 2339)    PRN Medications: cromolyn, dextrose, heparin, heparin, hydrALAZINE, HYDROmorphone (DILAUDID) injection, ondansetron (ZOFRAN) IV, oxyCODONE, sodium chloride flush, sodium chloride flush     Assessment/Plan   1. CAD: s/p PDA angioplasty + stent in 02/2014.  Admitted for NSTEMI 4/14. Hs Trop peaked at 3,309.  LHC w/ severe 3V CAD. Echo w/ reduced EF 30-35%. RV ok.  S/p CABG x 4 on 4/20 (LIMA-LAD, RIMA to PDA, Lt radial artery graft to 1st OM and distal OM branches of LCx).  Chest reopened urgently at the bedside for cardiac tamponade and removal of clot on 4/23  - continue ASA/Plavix.  Will need to stop one of the antiplatelet agents ultimately as he will be on anticoagulation at home.  - Atorvastatin 40 daily.   - Still have not been able to close chest, now has wound vac.  2. Acute Systolic Heart Failure: Ischemic Cardiomyopathy -> cardiogenic shock.  LVEF severely reduced at 30-35%, in the setting of NSTEM/ Multivessel CAD, RV ok initially.  Underwent urgent sternotomy at the bedside for cardiac tamponade and removal of clot on 4/23. Chest remains open. Luiz Blare numbers have been concerning for RV failure (swan now out).  Echo 4/24 EF 40 to 45% with inotropic support, small amount of residual pericardial clot, RV moderately  hypokinetic.  This morning, BP running lower and co-ox lower at 39%, now on norepinephrine 13.  CVP 8 with CVVH.  Concern for developing septic shock with rising WBCs.  - Add dobutamine 5 mcg/kg/min and repeat co-ox.  - Continue norepinephrine, wean as able.  - Increase midodrine to 10 mg tid.  - Keep CVVH around even to -50 cc/hr today with CVP 8 and higher pressor dose.       - Will repeat echo.   3.  Postoperative acute hypoxic respiratory failure: Reintubated on 4/23 due to cardiogenic shock and return to OR. Now with tracheostomy, FiO2 0.3.  - Trach collar trials per CCM.   4. AKI: Baseline SCr 0.9. Creatinine peaked to 5.3, suspect ATN in setting of cardiogenic shock/tamponade.  Now on CVVH for volume removal, 50 cc urine charted yesterday.  - Today with CVP 8 and lower MAP, keep UF no more than net -50.    5. Shock liver: Due to cardiac arrest on 4/23.  LFTs trending down.  6. 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 Maintaining NSR with PACs.   - Continue amiodarone per tube. - Heparin gtt currently while chest open.   7.  Possible small right lower lobe PE: Chest CT 4/14 w/ small RLL PE.  Bilateral Venous Dopplers negative for DVT.  - Heparin gtt has been started.   - Anticipate switching eliquis prior to d/c.  8. T2DM: Hgb A1c 7.7 - Remains on insulin 9. Pericardial tamponade: 4/23 return to OR for clot removal, chest wall left open. Patient had been on heparin gtt for DCCV of atrial flutter.  - Timing of chest closure per surgery.  10. Ileus: Improving, on Reglan. TFs ongoing.  - Continue Reglan 5 mg IV every 8 hrs.  11. Neuro: Following commands.  CT head negative. - Limit sedation  12. ID: Has been on vancomycin/cefepime with open chest.  WBCs rising now  up to 34.  Tm 99.2 in setting of CVVH.  Concern for infection/developing sepsis.  Cultures negative so far.  PCT 4.95 yesterday. CVL out and midline in.  - Continue vancomycin, will change cefepime to meropenem.  - Think  he will need HD catheter replaced.   Length of Stay: 23  CRITICAL CARE Performed by: Loralie Champagne  Total critical care time: 40 minutes  Critical care time was exclusive of separately billable procedures and treating other patients.  Critical care was necessary to treat or prevent imminent or life-threatening deterioration.  Critical care was time spent personally by me on the following activities: development of treatment plan with patient and/or surrogate as well as nursing, discussions with consultants, evaluation of patient's response to treatment, examination of patient, obtaining history from patient or surrogate, ordering and performing treatments and interventions, ordering and review of laboratory studies, ordering and review of radiographic studies, pulse oximetry and re-evaluation of patient's condition.   Loralie Champagne 09/01/2019 7:27 AM

## 2019-09-01 NOTE — Progress Notes (Signed)
ANTICOAGULATION CONSULT NOTE - Follow Up Consult  Pharmacy Consult for heparin Indication: pulmonary embolus  Labs: Recent Labs    08/30/19 0322 08/30/19 0323 08/31/19 0353 08/31/19 0353 08/31/19 0913 08/31/19 1729 08/31/19 2100 09/01/19 0349  HGB 7.8*   < > 7.9*  --   --   --   --  7.4*  HCT 24.3*  --  25.0*  --   --   --   --  23.9*  PLT 247  --  298  --   --   --   --  265  HEPARINUNFRC  --    < > 0.12*   < > 0.10*  --  0.19* 0.35  CREATININE 2.33*   < > 2.35*  --   --  2.35*  --  2.57*   < > = values in this interval not displayed.    Assessment: 74yo male therapeutic on heparin.  Hemoglobin stable at 7.4. S/p trach and chest closure yesterday 5/4.  Heparin turned off this morning for potential removal of HD cath.  Per discussion with Dr. Orvan Seen will leave off for now.  Goal of Therapy:  Heparin level 0.2-0.3 units/ml   Plan:  F/u plans to resume anticoagulation eventually.  Marguerite Olea, [facility] Clinical Pharmacist Phone [phone]  09/01/2019 2:03 PM

## 2019-09-01 NOTE — Progress Notes (Signed)
OT Cancellation Note  Patient Details Name: Christian [name] MRN: [id number] DOB: [birth year]   Cancelled Treatment:    Reason Eval/Treat Not Completed: Patient not medically ready;Other (comment)(pressure is dropping as well as sats) RN requesting hold per medical status.  Zenovia Jarred, MSOT, OTR/L Acute Rehabilitation Services [facility] Office Number: [phone] Pager: [phone]  Zenovia Jarred 09/01/2019, 10:25 AM

## 2019-09-01 NOTE — Progress Notes (Signed)
0900: Heparin stopped at with plan to remove right subclavian hemodialysis catheter after one hour of stopping heparin.   Heparin to then be restarted as appropriate after line removed.    Plan of care was to transfer levophed drip to peripheral IV.   CRRT was stopped at this time as well in preparation of line removal.  Levophed was running at 14 mcg/min and patient had been hypotensive.  Max rate levophed to run peripherally is 10 mcg/min.     NP Ninfa Meeker and MD Coladonato present at bedside for discussion regarding line removal, stopping CRRT, & hypotension.  NP Clegg spoke with MD Orvan Seen on the phone at this time to inform of plan of care.   0945: patient with continued hypotension and increasing levophed requirement.  NP Clegg paged and informed of same, NP Clegg advised would round bedside.  MD Agarwala rounded bedside as well as NP Clegg.  Patient status discussed with MD Agarwala.  Heparin remained off in anticipation of line removal and potential line placement.    Dobutamine stopped per MD Agarwala due to concerns that it was contributing to hypotension.  Albumin ordered to assist in fluid resuscitation.    1300: MD Atkins rounded on patient and updated on patient status.  MD Orvan Seen aware of line concerns and current medication requirements with ongoing hypotension.  MD Orvan Seen advised to continue to keep heparin off at this time and vasopressin ordered.  1400: MD Agarwala updated on patient status with continued hypotension and RN not being able to decrease levophed despite albumin infusion.   Vasopressin not yet started due to waiting on medication to arrive from pharmacy.   MD Agarwala advised he wants to place new HD line so current line can be removed and medication can safely be administered.

## 2019-09-01 NOTE — Progress Notes (Signed)
Pharmacy Antibiotic Note  KARMELLO ABERCROMBIE is a 74 y.o. male admitted on 08/09/2019 with open chest.  Pharmacy has been consulted for Vancomycin and Cefepime dosing. CRRT stopped this AM.  Cefepime changed to Meropenem for rising WBC. Resp Cx with rare pseudomonas - sensitivities pending  Had dose of vancomycin last night - estimated level currently ~ 19.5  Plan: Hold vancomycin for now. Change meropenem to 1g q 24 hrs. F/u plans to resume CRRT.  Height: 5\' 9"  (175.3 cm) Weight: 98.5 kg (217 lb 2.5 oz) IBW/kg (Calculated) : 70.7  Temp (24hrs), Avg:98.2 F (36.8 C), Min:96.9 F (36.1 C), Max:99.2 F (37.3 C)  Recent Labs  Lab 08/29/19 0323 08/29/19 0635 08/29/19 1816 08/30/19 0322 08/30/19 1605 08/30/19 2107 08/31/19 0353 08/31/19 1729 09/01/19 0349  WBC 18.0* 19.1*  --  19.9*  --   --  27.3*  --  33.6*  CREATININE 2.46*  --    < > 2.33* 2.29*  --  2.35* 2.35* 2.57*  VANCOTROUGH  --   --   --   --   --  30*  --   --   --    < > = values in this interval not displayed.    Estimated Creatinine Clearance: 30.1 mL/min (A) (by C-G formula based on SCr of 2.57 mg/dL (H)).    No Known Allergies  Antimicrobials this admission: Vancomycin 4/23 >> Cefepime 4/23 >>  Dose Adjustments: -vanc 2 g x 1 and cefepime 2>1g q24h for open chest 4/23> 2gm q12 for CRRT -VR 20 - redose 1 g 4/25 -VR 22 - no dose on 4/27 -4/29 pre crrt VR  16 >vanc 1250mg  q24 5/5 VT 30 will hold dose tonight and drop to 1gm q24 start 5/6 pm  Microbiology results: 4/22 BCx x 2 > Neg 5/2 TA: ng>reincubate>nf 5/2 bld x 2: ng4d 5/6 resp cx (TA) > pseudomonas - sensitivities pending  Marguerite Olea, [facility] Clinical Pharmacist Phone [phone]  09/01/2019 2:00 PM

## 2019-09-01 NOTE — Progress Notes (Addendum)
[facility] Progress Note Patient Name: Christian [name] DOB: [birth year] MRN: [id number]   Date of Service  09/01/2019  HPI/Events of Note  Hemoglobin 5.9, no external bleeding noted  eICU Interventions  1 unit RBC ordered and post transfusion H/H ordered Asked RN to call us with post transfusion H/H     Intervention Category Major Interventions: Hemorrhage - evaluation and management  Caeson Filippi G Teia Freitas 09/01/2019, 7:24 PM   4.15 am Hemoglobin 6.5 1 unit RBC to be repeated

## 2019-09-01 NOTE — Procedures (Signed)
Critical care central line insertion note  Indication: Hemodialysis  Consent obtained: yes  Preprocedural timeout called: yes  Full aseptic bundle used: yes  Ultrasound guidance: real time      Line inserted: 36F Power Trialysis   Insertion depth: 18cm  Vessel used: left subclavian, supraclavicular approach  Number of attempts: 2  Line placement confirmation: Guidewire in vessel  X-ray performed: tip at brachiocephalic and SVC junction. No pneumothorax.  Kipp Brood, MD [facility] ICU Physician [place]  Pager: [phone] Mobile: [phone] After hours: [phone].  06/20/2018, 6:28 PM

## 2019-09-01 NOTE — Progress Notes (Signed)
Patient ID: Christian [name], male   DOB: 1945/07/23, 74 y.o.   MRN: [id number] S: Became more hypotensive overnight and increase levophed requirements today.  Co-ox down to 39% and per Dr. Aundra Dubin, he is concerned about infection with Tm 99.2 and WBC up to 34.   O:BP (!) 97/54   Pulse (!) 108   Temp 97.7 F (36.5 C)   Resp (!) 24   Ht 5' 9"  (1.753 m)   Wt 98.5 kg   SpO2 100%   BMI 32.07 kg/m   Intake/Output Summary (Last 24 hours) at 09/01/2019 0916 Last data filed at 09/01/2019 0800 Gross per 24 hour  Intake 2598.12 ml  Output 5057 ml  Net -2458.88 ml   Intake/Output: I/O last 3 completed shifts: In: 3725.1 [I.V.:733.7; NG/GT:2491.7; IV Piggyback:499.7] Out: 7209 [Drains:150; OBSJG:2836; Stool:400; Chest Tube:70]  Intake/Output this shift:  Total I/O In: 73 [I.V.:23; NG/GT:50] Out: 121 [Other:121] Weight change: 5.4 kg Gen: on vent awake and alert CVS: tachy at 108 Resp: occ rhonchi Abd: distended, +BS, soft, NT Ext: some edema of left arm, minimal presacral edema  Recent Labs  Lab 08/29/19 0323 08/29/19 1816 08/30/19 0322 08/30/19 1605 08/31/19 0353 08/31/19 1729 09/01/19 0349  NA 136 138 136 136 135 138 138  K 4.0 4.0 4.4 3.9 4.0 4.1 4.3  CL 100 105 102 101 104 101 101  CO2 23 21* 21* 21* 23 24 22   GLUCOSE 158* 131* 135* 154* 138* 206* 209*  BUN 48* 45* 47* 46* 47* 50* 56*  CREATININE 2.46* 2.68* 2.33* 2.29* 2.35* 2.35* 2.57*  ALBUMIN 2.3* 2.2* 2.3* 2.2* 2.3* 2.2* 2.2*  CALCIUM 8.7* 8.3* 8.7* 8.5* 8.3* 8.3* 8.3*  PHOS 3.4 4.1 3.7 2.8 3.0 2.3* 3.1   Liver Function Tests: Recent Labs  Lab 08/31/19 0353 08/31/19 1729 09/01/19 0349  ALBUMIN 2.3* 2.2* 2.2*   No results for input(s): LIPASE, AMYLASE in the last 168 hours. No results for input(s): AMMONIA in the last 168 hours. CBC: Recent Labs  Lab 08/29/19 0323 08/29/19 0323 08/29/19 0635 08/29/19 0635 08/30/19 0322 08/31/19 0353 09/01/19 0349  WBC 18.0*   < > 19.1*   < > 19.9* 27.3* 33.6*   NEUTROABS 14.9*  --   --    < > 17.1* 22.0* 25.7*  HGB 7.7*   < > 7.7*   < > 7.8* 7.9* 7.4*  HCT 24.8*   < > 24.8*   < > 24.3* 25.0* 23.9*  MCV 96.1  --  96.1  --  95.3 96.9 98.0  PLT 224   < > 229   < > 247 298 265   < > = values in this interval not displayed.   Cardiac Enzymes: No results for input(s): CKTOTAL, CKMB, CKMBINDEX, TROPONINI in the last 168 hours. CBG: Recent Labs  Lab 08/31/19 1559 08/31/19 2019 09/01/19 0052 09/01/19 0433 09/01/19 0751  GLUCAP 174* 209* 208* 207* 153*    Iron Studies: No results for input(s): IRON, TIBC, TRANSFERRIN, FERRITIN in the last 72 hours. Studies/Results: DG Chest 1 View  Result Date: 08/31/2019 CLINICAL DATA:  Intubated, worsening leukocytosis EXAM: CHEST  1 VIEW COMPARISON:  08/29/2019 chest radiograph. FINDINGS: Tracheostomy tube tip overlies the tracheal air column at the thoracic inlet. Enteric tube enters stomach with the tip not seen on this image. Right subclavian central venous catheter terminates over the cavoatrial junction. Right internal jugular central venous sheath terminates in the right brachiocephalic vein. Stable bilateral chest tubes and mediastinal drain. Stable cardiomediastinal silhouette  with mild cardiomegaly. No pneumothorax. No pleural effusion. No pulmonary edema. New hazy left lung base opacity. Similar minimal patchy opacity in the peripheral right mid lung. IMPRESSION: 1. Well-positioned support structures. No pneumothorax. 2. New hazy left lung base opacity, favor atelectasis, cannot exclude aspiration or pneumonia. 3. Stable minimal patchy opacity in the peripheral right mid lung. Electronically Signed   By: Ilona Sorrel M.D.   On: 08/31/2019 08:50   DG Chest Port 1 View  Result Date: 09/01/2019 CLINICAL DATA:  Status post chest wound evacuation. Tracheostomy present. EXAM: PORTABLE CHEST 1 VIEW COMPARISON:  Aug 31, 2019 FINDINGS: Tracheostomy catheter tip is 6.5 cm above the carina. Feeding tube tip is below the  diaphragm. Central catheter tip is in the superior vena cava. There are bilateral chest tubes and a mediastinal drain. No pneumothorax appreciable. There is persistent airspace opacity in the left lower lobe with fairly small left pleural effusion. Ill-defined opacity noted in right mid lung region, stable. Heart is mildly enlarged with pulmonary vascularity normal. Patient is status post coronary artery bypass grafting. No bone lesions. IMPRESSION: Tube and catheter positions as described without evident pneumothorax. Airspace opacity left lower lobe with small left pleural effusion. Suspect atelectasis in this area with potential superimposed pneumonia or possible aspiration. Ill-defined opacity in the periphery the right mid lung remains, likely of infectious etiology. No new opacity evident. Stable cardiomegaly. Electronically Signed   By: Lowella Grip III M.D.   On: 09/01/2019 08:26   [place] EKG SITE RITE  Result Date: 08/31/2019 If Site Rite image not attached, placement could not be confirmed due to current cardiac rhythm.  . sodium chloride   Intravenous Once  . sodium chloride   Intravenous Once  . amiodarone  200 mg Per Tube BID  . aspirin  81 mg Per Tube Daily  . atorvastatin  40 mg Per Tube Daily  . B-complex with vitamin C  1 tablet Per Tube Daily  . bisacodyl  10 mg Oral Daily   Or  . bisacodyl  10 mg Rectal Daily  . chlorhexidine gluconate (MEDLINE KIT)  15 mL Mouth Rinse BID  . Chlorhexidine Gluconate Cloth  6 each Topical Daily  . clopidogrel  75 mg Per Tube Daily  . docusate  100 mg Per Tube BID  . feeding supplement (PRO-STAT SUGAR FREE 64)  60 mL Per Tube TID  . insulin aspart  0-24 Units Subcutaneous Q4H  . mouth rinse  15 mL Mouth Rinse 10 times per day  . metoCLOPramide (REGLAN) injection  5 mg Intravenous Q8H  . midodrine  10 mg Oral TID WC  . pantoprazole sodium  40 mg Per Tube Daily  . sodium chloride flush  10-40 mL Intracatheter Q12H  . sodium chloride flush   10-40 mL Intracatheter Q12H  . trospium  20 mg Per Tube BID    BMET    Component Value Date/Time   NA 138 09/01/2019 0349   NA 141 06/19/2019 1130   K 4.3 09/01/2019 0349   CL 101 09/01/2019 0349   CO2 22 09/01/2019 0349   GLUCOSE 209 (H) 09/01/2019 0349   BUN 56 (H) 09/01/2019 0349   BUN 21 06/19/2019 1130   CREATININE 2.57 (H) 09/01/2019 0349   CALCIUM 8.3 (L) 09/01/2019 0349   GFRNONAA 24 (L) 09/01/2019 0349   GFRAA 28 (L) 09/01/2019 0349   CBC    Component Value Date/Time   WBC 33.6 (H) 09/01/2019 0349   RBC 2.44 (L) 09/01/2019 7858  HGB 7.4 (L) 09/01/2019 0349   HCT 23.9 (L) 09/01/2019 0349   PLT 265 09/01/2019 0349   MCV 98.0 09/01/2019 0349   MCH 30.3 09/01/2019 0349   MCHC 31.0 09/01/2019 0349   RDW 18.6 (H) 09/01/2019 0349   LYMPHSABS 2.1 09/01/2019 0349   MONOABS 2.3 (H) 09/01/2019 0349   EOSABS 0.4 09/01/2019 0349   BASOSABS 0.3 (H) 09/01/2019 0349     Assessment/Plan:  1. Shock- more hypotensive and rising WBC, worrisome for sepsis.  Discussed with Heart failure team and Dr. Orvan Seen. Will plan a line holiday for the next 24-48 hours and hold CRRT.  Awaiting cultures, now on meripenem. 2. AKI- following cardiac cath, cabg, cardiogenic shock, cardiac arrest due to tamponade, and acute on chronic CHF. Started CRRT on 08/24/19 due to anasarca and need for volume removal to close his open chest. 1. Will hold CRRT and remove HD cath out of infection concerns. 1. Volume improved but dropping BP and CVP of 8 2. CAD s/p CABG x 4 complicated by cardiac tamponade s/p reopened chest urgently on 08/18/19 with wound vac. 3. Acute systolic heart failure due to ischemic cardiomyopathy- volume markedly improved with CRRT. 4. Pericardial tamponade- s/p urgent surgery and chest opening and now with wound vac.  For chest closure per Dr. Orvan Seen 5. Postoperative acute hypoxic respiratory failure- reintubated 08/18/19 and likely aspiration. 6. Anemia- stableand transfuse  prn. 7. Ileus- had BM last night but still distended and tense. Per primary. Donetta Potts, MD [employer] [phone]

## 2019-09-01 NOTE — Progress Notes (Signed)
Pharmacy Antibiotic Note  Christian [name] is a 74 y.o. male admitted on 08/09/2019 with open chest.  Pharmacy has been previously been consulted for Vancomycin and Cefepime dosing - now adding Fluconazole for intra-abdominal coverage of yeast.  CRRT stopped this AM. Unsure when will resume - central line was exchanged this PM.   WBC is increasing (33.6). Tm 99.3. Requiring Levophed and Vasopressin.   Plan: Fluconazole 800mg  IV loading dose now, then follow-up CRRT plans.  Per RN, CRRT will be readdressed tomorrow 5/8.  Follow-up restart of CRRT in AM to schedule dosing.     Height: 5\' 9"  (175.3 cm) Weight: 98.5 kg (217 lb 2.5 oz) IBW/kg (Calculated) : 70.7  Temp (24hrs), Avg:98.2 F (36.8 C), Min:96.9 F (36.1 C), Max:99.2 F (37.3 C)  Recent Labs  Lab 08/29/19 0323 08/29/19 0635 08/29/19 1816 08/30/19 0322 08/30/19 1605 08/30/19 2107 08/31/19 0353 08/31/19 1729 09/01/19 0349  WBC 18.0* 19.1*  --  19.9*  --   --  27.3*  --  33.6*  CREATININE 2.46*  --    < > 2.33* 2.29*  --  2.35* 2.35* 2.57*  VANCOTROUGH  --   --   --   --   --  30*  --   --   --    < > = values in this interval not displayed.    Estimated Creatinine Clearance: 30.1 mL/min (A) (by C-G formula based on SCr of 2.57 mg/dL (H)).    No Known Allergies  Antimicrobials this admission: Vancomycin 4/23 >> Cefepime 4/23 >> Fluconazole 5/7 >>  Dose Adjustments: -vanc 2 g x 1 and cefepime 2>1g q24h for open chest 4/23> 2gm q12 for CRRT -VR 20 - redose 1 g 4/25 -VR 22 - no dose on 4/27 -4/29 pre crrt VR  16 >vanc 1250mg  q24 5/5 VT 30 will hold dose tonight and drop to 1gm q24 start 5/6 pm  Microbiology results: 4/22 BCx x 2 > Neg 5/2 TA: ng>reincubate>nf 5/2 bld x 2: ng4d 5/6 resp cx (TA) > pseudomonas - sensitivities pending  Sloan Leiter, PharmD, BCPS, BCCCP Clinical Pharmacist Please refer to [facility] for [place] numbers  09/01/2019 4:42 PM

## 2019-09-01 NOTE — Progress Notes (Signed)
  Discussed with Dr Orvan Seen, Dr Aundra Dubin, and Dr Marval Regal.   Discussed with nursing staff.  WBC continues to rise. Blood Cx  Obtain 5/6 - NGTD.  On meropenum and vanc.   Remove HD catheter and give line holiday.   Will need to run norepi through PIV. Watch for skin irritation.   Dr Orvan Seen will replace HD catheter.   Damiya Sandefur NP-C  8:56 AM

## 2019-09-01 NOTE — Progress Notes (Signed)
CRITICAL VALUE ALERT  Critical Value:  Hemoglobin 5.9  Date & Time Notied:  09/01/2019  At 1910  Provider Notified: CCM MD at [facility] called and notified by Izetta Dakin RN.   Orders Received/Actions taken: new orders received.

## 2019-09-01 NOTE — Progress Notes (Signed)
CRITICAL VALUE ALERT  Critical Value:  Hemoglobin 6.9 on co-ox result   Date & Time Notied:  09/01/2019  1745  Provider Notified: MD Agarwala   Orders Received/Actions taken: new orders received.

## 2019-09-01 NOTE — Progress Notes (Signed)
      [address]       [place] [address]             [phone]      3 Days Post-Op Procedure(s) (LRB): CHEST WOUND WASHOUT POST OPERATIVE OPEN HEART (N/A) Tracheostomy Wound Vac change   Subjective:  Patient just fell back asleep.  Per nursing increase requirement of Levophed due to low pressure today.  Objective: Vital signs in last 24 hours: Temp:  [96.9 F (36.1 C)-99.2 F (37.3 C)] 98.3 F (36.8 C) (05/07 1131) Pulse Rate:  [34-122] 102 (05/07 1030) Cardiac Rhythm: Sinus tachycardia (05/07 0400) Resp:  [19-42] 37 (05/07 1030) BP: (69-120)/(18-81) 78/52 (05/07 1030) SpO2:  [80 %-100 %] 98 % (05/07 1030) FiO2 (%):  [28 %-30 %] 28 % (05/07 0750) Weight:  [98.5 kg] 98.5 kg (05/07 0451)  Hemodynamic parameters for last 24 hours: CVP:  [6 mmHg-8 mmHg] 8 mmHg  Intake/Output from previous day: 05/06 0701 - 05/07 0700 In: 2634.8 [I.V.:473.2; NG/GT:1761.7; IV Piggyback:399.9] Out: 2500 [Drains:100; Stool:400] Intake/Output this shift: Total I/O In: 341.1 [I.V.:91.1; NG/GT:150; IV Piggyback:100] Out: 121 [Other:121]  General appearance: resting on vent, responds to commands Heart: regular rate and rhythm Lungs: diminished breath sounds bibasilar Abdomen: soft, non-tender; bowel sounds normal; no masses,  no organomegaly Extremities: edema mild pitting edema Wound: wound vac in place on open sternum, staples in place on left radial harvest site  Lab Results: Recent Labs    08/31/19 0353 09/01/19 0349  WBC 27.3* 33.6*  HGB 7.9* 7.4*  HCT 25.0* 23.9*  PLT 298 265   BMET:  Recent Labs    08/31/19 1729 09/01/19 0349  NA 138 138  K 4.1 4.3  CL 101 101  CO2 24 22  GLUCOSE 206* 209*  BUN 50* 56*  CREATININE 2.35* 2.57*  CALCIUM 8.3* 8.3*    PT/INR: No results for input(s): LABPROT, INR in the last 72 hours. ABG    Component Value Date/Time   PHART 7.513 (H) 08/20/2019 0404   HCO3 27.9 08/20/2019 0404   TCO2 29 08/20/2019 0404   ACIDBASEDEF 5.0 (H) 08/18/2019 2341   O2SAT 39.4 09/01/2019 0349   CBG (last 3)  Recent Labs    09/01/19 0433 09/01/19 0751 09/01/19 1130  GLUCAP 207* 153* 226*    Assessment/Plan: S/P Procedure(s) (LRB): CHEST WOUND WASHOUT POST OPERATIVE OPEN HEART (N/A) Tracheostomy Wound Vac change  1. CV- NSR with PACs- increase in Levophed to 27, issues with hypotension, receiving albumin now per CCM recommendations, AHF is following 2. Pulm- on vent, tracheostomy in place.. per CCM 3. Renal- creatinine at 2.57, CRRT stopped due to pressure issues, Nephrology following 4. ID- on broad spectrum ABX with open chest, he has leukocytosis up to 34K, central line has been removed, possibly sepsis vs. Infection.. plan is to remove dialysis line and give holiday prior to replacement 5. Dispo- patient with worsening hypotension, on Levophed at 27, all lines have been removed, peripheral line is being used for IV medications, continue ABX, AHF, CCM, Nephrology managing   LOS: 23 days    [name], PA-C  09/01/2019

## 2019-09-02 ENCOUNTER — Inpatient Hospital Stay (HOSPITAL_COMMUNITY): Payer: Medicare Other

## 2019-09-02 DIAGNOSIS — J9611 Chronic respiratory failure with hypoxia: Secondary | ICD-10-CM

## 2019-09-02 DIAGNOSIS — E1142 Type 2 diabetes mellitus with diabetic polyneuropathy: Secondary | ICD-10-CM

## 2019-09-02 DIAGNOSIS — Z951 Presence of aortocoronary bypass graft: Secondary | ICD-10-CM

## 2019-09-02 LAB — CBC WITH DIFFERENTIAL/PLATELET
Abs Immature Granulocytes: 1.81 10*3/uL — ABNORMAL HIGH (ref 0.00–0.07)
Basophils Absolute: 0.1 10*3/uL (ref 0.0–0.1)
Basophils Relative: 0 %
Eosinophils Absolute: 0.3 10*3/uL (ref 0.0–0.5)
Eosinophils Relative: 1 %
HCT: 20 % — ABNORMAL LOW (ref 39.0–52.0)
Hemoglobin: 6.4 g/dL — CL (ref 13.0–17.0)
Immature Granulocytes: 8 %
Lymphocytes Relative: 5 %
Lymphs Abs: 1.1 10*3/uL (ref 0.7–4.0)
MCH: 30.8 pg (ref 26.0–34.0)
MCHC: 32 g/dL (ref 30.0–36.0)
MCV: 96.2 fL (ref 80.0–100.0)
Monocytes Absolute: 1.5 10*3/uL — ABNORMAL HIGH (ref 0.1–1.0)
Monocytes Relative: 7 %
Neutro Abs: 16.8 10*3/uL — ABNORMAL HIGH (ref 1.7–7.7)
Neutrophils Relative %: 79 %
Platelets: 184 10*3/uL (ref 150–400)
RBC: 2.08 MIL/uL — ABNORMAL LOW (ref 4.22–5.81)
RDW: 18 % — ABNORMAL HIGH (ref 11.5–15.5)
WBC: 21.6 10*3/uL — ABNORMAL HIGH (ref 4.0–10.5)
nRBC: 2.4 % — ABNORMAL HIGH (ref 0.0–0.2)

## 2019-09-02 LAB — COMPREHENSIVE METABOLIC PANEL
ALT: 49 U/L — ABNORMAL HIGH (ref 0–44)
AST: 38 U/L (ref 15–41)
Albumin: 2.1 g/dL — ABNORMAL LOW (ref 3.5–5.0)
Alkaline Phosphatase: 104 U/L (ref 38–126)
Anion gap: 13 (ref 5–15)
BUN: 87 mg/dL — ABNORMAL HIGH (ref 8–23)
CO2: 20 mmol/L — ABNORMAL LOW (ref 22–32)
Calcium: 8.2 mg/dL — ABNORMAL LOW (ref 8.9–10.3)
Chloride: 104 mmol/L (ref 98–111)
Creatinine, Ser: 4.33 mg/dL — ABNORMAL HIGH (ref 0.61–1.24)
GFR calc Af Amer: 15 mL/min — ABNORMAL LOW (ref >60)
GFR calc non Af Amer: 13 mL/min — ABNORMAL LOW (ref >60)
Glucose, Bld: 238 mg/dL — ABNORMAL HIGH (ref 70–99)
Potassium: 3.9 mmol/L (ref 3.5–5.1)
Sodium: 137 mmol/L (ref 135–145)
Total Bilirubin: 1 mg/dL (ref 0.3–1.2)
Total Protein: 5.1 g/dL — ABNORMAL LOW (ref 6.5–8.1)

## 2019-09-02 LAB — RENAL FUNCTION PANEL
Albumin: 2.4 g/dL — ABNORMAL LOW (ref 3.5–5.0)
Anion gap: 12 (ref 5–15)
BUN: 75 mg/dL — ABNORMAL HIGH (ref 8–23)
CO2: 22 mmol/L (ref 22–32)
Calcium: 8.4 mg/dL — ABNORMAL LOW (ref 8.9–10.3)
Chloride: 105 mmol/L (ref 98–111)
Creatinine, Ser: 3.94 mg/dL — ABNORMAL HIGH (ref 0.61–1.24)
GFR calc Af Amer: 17 mL/min — ABNORMAL LOW (ref >60)
GFR calc non Af Amer: 14 mL/min — ABNORMAL LOW (ref >60)
Glucose, Bld: 138 mg/dL — ABNORMAL HIGH (ref 70–99)
Phosphorus: 2.9 mg/dL (ref 2.5–4.6)
Potassium: 4.4 mmol/L (ref 3.5–5.1)
Sodium: 139 mmol/L (ref 135–145)

## 2019-09-02 LAB — PROCALCITONIN: Procalcitonin: 21.44 ng/mL

## 2019-09-02 LAB — CULTURE, RESPIRATORY W GRAM STAIN: Gram Stain: NONE SEEN

## 2019-09-02 LAB — HEMOGLOBIN AND HEMATOCRIT, BLOOD
HCT: 23.6 % — ABNORMAL LOW (ref 39.0–52.0)
HCT: 26.8 % — ABNORMAL LOW (ref 39.0–52.0)
Hemoglobin: 7.5 g/dL — ABNORMAL LOW (ref 13.0–17.0)
Hemoglobin: 8.4 g/dL — ABNORMAL LOW (ref 13.0–17.0)

## 2019-09-02 LAB — PREPARE RBC (CROSSMATCH)

## 2019-09-02 LAB — BASIC METABOLIC PANEL
Anion gap: 13 (ref 5–15)
BUN: 86 mg/dL — ABNORMAL HIGH (ref 8–23)
CO2: 20 mmol/L — ABNORMAL LOW (ref 22–32)
Calcium: 8.1 mg/dL — ABNORMAL LOW (ref 8.9–10.3)
Chloride: 106 mmol/L (ref 98–111)
Creatinine, Ser: 4.06 mg/dL — ABNORMAL HIGH (ref 0.61–1.24)
GFR calc Af Amer: 16 mL/min — ABNORMAL LOW (ref >60)
GFR calc non Af Amer: 14 mL/min — ABNORMAL LOW (ref >60)
Glucose, Bld: 244 mg/dL — ABNORMAL HIGH (ref 70–99)
Potassium: 4.2 mmol/L (ref 3.5–5.1)
Sodium: 139 mmol/L (ref 135–145)

## 2019-09-02 LAB — GLUCOSE, CAPILLARY
Glucose-Capillary: 185 mg/dL — ABNORMAL HIGH (ref 70–99)
Glucose-Capillary: 188 mg/dL — ABNORMAL HIGH (ref 70–99)
Glucose-Capillary: 188 mg/dL — ABNORMAL HIGH (ref 70–99)
Glucose-Capillary: 230 mg/dL — ABNORMAL HIGH (ref 70–99)
Glucose-Capillary: 238 mg/dL — ABNORMAL HIGH (ref 70–99)

## 2019-09-02 LAB — MAGNESIUM: Magnesium: 2.6 mg/dL — ABNORMAL HIGH (ref 1.7–2.4)

## 2019-09-02 LAB — BRAIN NATRIURETIC PEPTIDE: B Natriuretic Peptide: 761 pg/mL — ABNORMAL HIGH (ref 0.0–100.0)

## 2019-09-02 LAB — HEPARIN LEVEL (UNFRACTIONATED): Heparin Unfractionated: <0.1 IU/mL — ABNORMAL LOW (ref 0.30–0.70)

## 2019-09-02 MED ORDER — INSULIN DETEMIR 100 UNIT/ML ~~LOC~~ SOLN
10.0000 [IU] | Freq: Two times a day (BID) | SUBCUTANEOUS | Status: DC
  Administered 2019-09-02 – 2019-09-14 (×25): 10 [IU] via SUBCUTANEOUS
  Filled 2019-09-02 (×30): qty 0.1

## 2019-09-02 MED ORDER — SODIUM CHLORIDE 0.9% IV SOLUTION: Freq: Once | INTRAVENOUS | Status: AC

## 2019-09-02 MED ORDER — PRISMASOL BGK 4/2.5 32-4-2.5 MEQ/L IV SOLN: INTRAVENOUS | Status: DC

## 2019-09-02 MED ORDER — PRISMASOL BGK 4/2.5 32-4-2.5 MEQ/L REPLACEMENT SOLN: Status: DC

## 2019-09-02 MED ORDER — HEPARIN (PORCINE) 2000 UNITS/L FOR CRRT
INTRAVENOUS_CENTRAL | Status: DC | PRN
  Filled 2019-09-02 (×5): qty 1000

## 2019-09-02 MED ORDER — VANCOMYCIN HCL IN DEXTROSE 1-5 GM/200ML-% IV SOLN
1000.0000 mg | INTRAVENOUS | Status: DC
  Administered 2019-09-02 – 2019-09-05 (×4): 1000 mg via INTRAVENOUS
  Filled 2019-09-02 (×4): qty 200

## 2019-09-02 MED ORDER — FLUCONAZOLE IN SODIUM CHLORIDE 400-0.9 MG/200ML-% IV SOLN: 800.0000 mg | INTRAVENOUS | Status: DC

## 2019-09-02 MED ORDER — SODIUM CHLORIDE 0.9 % IV SOLN
1.0000 g | Freq: Three times a day (TID) | INTRAVENOUS | Status: DC
  Administered 2019-09-03 – 2019-09-06 (×12): 1 g via INTRAVENOUS
  Filled 2019-09-02 (×14): qty 1

## 2019-09-02 MED ORDER — FLUCONAZOLE IN SODIUM CHLORIDE 400-0.9 MG/200ML-% IV SOLN
800.0000 mg | INTRAVENOUS | Status: DC
  Administered 2019-09-02 – 2019-09-05 (×4): 800 mg via INTRAVENOUS
  Filled 2019-09-02 (×5): qty 400

## 2019-09-02 NOTE — Progress Notes (Signed)
Patient ID: Christian [name], male   DOB: 1946/01/15, 74 y.o.   MRN: [id number] EVENING ROUNDS NOTE :     [address]       [place] [address]             [phone]                 4 Days Post-Op Procedure(s) (LRB): CHEST WOUND WASHOUT POST OPERATIVE OPEN HEART (N/A) Tracheostomy Wound Vac change  Total Length of Stay:  LOS: 24 days  BP (!) 126/53   Pulse 76   Temp 98.4 F (36.9 C) (Oral)   Resp 19   Ht 5\' 9"  (1.753 m)   Wt 98.5 kg   SpO2 100%   BMI 32.07 kg/m   .Intake/Output      05/08 0701 - 05/09 0700   I.V. (mL/kg) 87.3 (0.9)   Blood 315   NG/GT 600   IV Piggyback 299.9   Total Intake(mL/kg) 1302.2 (13.2)   Drains 75   Other 1054   Stool 400   Chest Tube 10   Total Output 1539   Net -236.8         .  prismasol BGK 4/2.5 500 mL/hr at 09/02/19 1800  .  prismasol BGK 4/2.5 300 mL/hr at 09/02/19 1800  . sodium chloride Stopped (09/01/19 1008)  . feeding supplement (VITAL 1.5 CAL) 50 mL/hr at 09/02/19 1500  . fluconazole (DIFLUCAN) IV 800 mg (09/02/19 1937)  . heparin Stopped (09/01/19 0851)  . lactated ringers 20 mL/hr at 08/20/19 1200  . magnesium sulfate bolus IVPB    . meropenem (MERREM) IV    . norepinephrine (LEVOPHED) Adult infusion Stopped (09/02/19 0647)  . prismasol BGK 4/2.5 1,500 mL/hr at 09/02/19 1700  . vancomycin Stopped (09/02/19 1805)  . vasopressin (PITRESSIN) infusion - *FOR SHOCK* Stopped (09/02/19 1634)     Lab Results  Component Value Date   WBC 21.6 (H) 09/02/2019   HGB 7.5 (L) 09/02/2019   HCT 23.6 (L) 09/02/2019   PLT 184 09/02/2019   GLUCOSE 138 (H) 09/02/2019   CHOL 107 08/11/2019   TRIG 71 08/27/2019   HDL 42 08/11/2019   LDLDIRECT 40.0 11/23/2018   LDLCALC 40 08/11/2019   ALT 49 (H) 09/02/2019   AST 38 09/02/2019   NA 139 09/02/2019   K 4.4 09/02/2019   CL 105 09/02/2019   CREATININE 3.94 (H) 09/02/2019   BUN 75 (H) 09/02/2019   CO2 22 09/02/2019   TSH 2.590 04/19/2019   INR 1.3 (H)  08/19/2019   HGBA1C 7.7 (H) 08/14/2019   Now off drips  bp stable cvvh on Remains on vent  Grace Lesean MD  [facility] [phone] Office [phone] 09/02/2019 7:46 PM

## 2019-09-02 NOTE — Progress Notes (Signed)
Pharmacy Antibiotic Note  Christian [name] is a 74 y.o. male admitted on 08/09/2019 with open chest.  Pharmacy has been previously been consulted for Vancomycin and meropenem dosing, recently added Fluconazole for intra-abdominal coverage of yeast.  CRRT stopped 5/7 and restarting this morning 5/8 am.   Tmax 100.5, wbc 21.6.  Calculated expected vancomycin trough of ~19-20 on 5/7 when CRRT was off. Will plan on redosing antibiotics this evening once CRRT has ran for consistent amount of time.   Pseudomonas resulted pan sensitive.   Plan: Fluconazole 800mg  IV daily - lower reabsorption requiring higher dose on CRRT.  Resume vancomycin 1g q24 tonight Increase meropenem to 1g q8 hours while on CRRT    Height: 5\' 9"  (175.3 cm) Weight: 98.5 kg (217 lb 2.5 oz) IBW/kg (Calculated) : 70.7  Temp (24hrs), Avg:99.5 F (37.5 C), Min:98.3 F (36.8 C), Max:100.5 F (38.1 C)  Recent Labs  Lab 08/29/19 0635 08/29/19 1816 08/30/19 0322 08/30/19 1605 08/30/19 2107 08/31/19 0353 08/31/19 1729 09/01/19 0349 09/02/19 0123 09/02/19 0311  WBC 19.1*  --  19.9*  --   --  27.3*  --  33.6*  --  21.6*  CREATININE  --    < > 2.33*   < >  --  2.35* 2.35* 2.57* 4.06* 4.33*  VANCOTROUGH  --   --   --   --  30*  --   --   --   --   --    < > = values in this interval not displayed.    Estimated Creatinine Clearance: 17.8 mL/min (A) (by C-G formula based on SCr of 4.33 mg/dL (H)).    No Known Allergies  Antimicrobials this admission: Vancomycin 4/23 >> Cefepime 4/23 >> 5/7 Meropenem 5/7 >> Fluconazole 5/7 >>  Dose Adjustments: -vanc 2 g x 1 and cefepime 2>1g q24h for open chest 4/23> 2gm q12 for CRRT -VR 20 - redose 1 g 4/25 -VR 22 - no dose on 4/27 -4/29 pre crrt VR  16 >vanc 1250mg  q24 5/5 VT 30 will hold dose tonight and drop to 1gm q24 start 5/6 pm  Microbiology results: 4/22 BCx x 2 > Neg 5/2 TA: ng>reincubate>nf 5/2 bld x 2: ng4d 5/6 resp cx (TA) > pseudomonas - pan  sensitive  Erin Hearing PharmD., BCPS Clinical Pharmacist 09/02/2019 10:16 AM

## 2019-09-02 NOTE — Progress Notes (Signed)
NAME:  Christian [name], MRN:  [id number], DOB:  21-Feb-1946, LOS: 24 ADMISSION DATE:  08/09/2019, CONSULTATION DATE:  08/28/2019 REFERRING MD:  Orvan Seen - TRH, CHIEF COMPLAINT:  Respiratory failure.    HPI/course in hospital   74 year old man who presented with progressive dyspnea.  He was admitted 4/14 and was incidentally found to have a right lower lobe pulmonary embolism.  Overall the picture was that of decompensated heart failure with a non-STEMI.  Coronary angiography revealed triple-vessel disease and the patient was referred for cardiac surgery.  EF was found to be reduced at that time at  He underwent four-vessel CABG 4/20 with good quality distal targets.  Cardiac function remained reduced post bypass.  Unable to close chest due to surrounding edema.  Underwent attempted closure with VAC placement 4/22.   Cardiac arrest 4/23 with repeated washouts with VAC placements on 4/26 and 4/29.  Significant volume overload postoperatively.  Acute kidney injury.  Currently on CRRT for fluid removal.  08/09/19 Admitted with NSTEM and ? Small RLL PE. Weight 213 pounds.  08/15/19 CABG x4 on 08/15/19 08/18/19 Cardiac arrest due to tamponade. Chest opened for washout 4/26 Chest washout in OR 4/29 CVVH started 5/5 OR for chest washout, tracheostomy    Interim history/subjective:  No events, awake and alert on vent.  Denies pain. Low dose pressors ongoing. Restarted CRRT with negative pull. No concerns per nursing.  Objective   Blood pressure 132/80, pulse 81, temperature 99.4 F (37.4 C), temperature source Oral, resp. rate (!) 26, height 5\' 9"  (1.753 m), weight 98.5 kg, SpO2 100 %.    Vent Mode: PRVC FiO2 (%):  [30 %] 30 % Set Rate:  [18 bmp] 18 bmp Vt Set:  [560 mL] 560 mL PEEP:  [5 cmH20] 5 cmH20 Plateau Pressure:  [18 cmH20-23 cmH20] 18 cmH20   Intake/Output Summary (Last 24 hours) at 09/02/2019 1116 Last data filed at 09/02/2019 1100 Gross per 24 hour  Intake 4527.4 ml  Output 875  ml  Net 3652.4 ml   Filed Weights   08/30/19 0600 08/31/19 0500 09/01/19 0451  Weight: 91.9 kg 93.1 kg 98.5 kg    Examination: GEN: No acute distress lying in bed HEENT: Tracheostomy in place with small thick secretions CV: Heart sounds regular, extremities warm PULM: He has transmitted upper airway sounds consistent with retained tracheal secretions, there is no accessory muscle use, he is tachypneic on the ventilator GI: Protuberant, soft, positive bowel sounds, brown stool in rectal bag EXT: He has diffuse mild anasarca, left upper extremity vein graft site healing well NEURO: Moves all 4 extremities to command, globally weak PSYCH: RASS 0 SKIN: No rashes, sternum remains open  Trach aspirate 5/6 with pan sensitive pseudomonas Blood cx neg Echo 4/24: EF 45% with Grade 2 DD.   L [place] temporary HD line R midline PIV Mediastinal drain Rectal tube NGT Wound vac over sternum  Assessment & Plan:  Postoperative persistent respiratory failure after CABG and multiple mediastinal washouts, inability to close chest.  S/P trach. - PS trials as able during day - Pull off fluid as able with CRRT - Full vent at night  Pseudomonas tracheiitis vs. Pneumonia - Consider narrowing antibiotics  Acute blood loss anemia- unclear origin - 2 units this AM - May warrant CT A/P if persistent  RLL pulmonary embolism noted mid April, afib - Needs to be back on [facility] at some point - Continue amiodarone  Deconditioning related to prolonged ICU stay - Progressive mobility  as able  Acute tubular necrosis now HD dependent, generalized anasarca, inability to close chest - CRRT with negative fluid as able  Vasoplegia vs. Cardiac stunning postop - Continue midodrine and pressors as needed to maintain MAP > 65  Daily Goals Checklist   Best practice:  Diet: TF Pain/Anxiety/Delirium protocol (if indicated): PRNs written for VAP protocol (if indicated): in place DVT prophylaxis: SCDs GI  prophylaxis: PPI Glucose control: SSI, start levemir Mobility: BR Code Status: full Family Communication: per primary Disposition: ICU  The patient is critically ill with multiple organ systems failure and requires high complexity decision making for assessment and support, frequent evaluation and titration of therapies, application of advanced monitoring technologies and extensive interpretation of multiple databases. Critical Care Time devoted to patient care services described in this note independent of APP/resident time (if applicable)  is 34 minutes.   Erskine Emery MD [facility] 09/02/2019 11:32 AM Personal pager: [phone] If unanswered, please page CCM On-call: [phone]

## 2019-09-02 NOTE — Progress Notes (Signed)
Patient ID: MURL GOLLADAY, male   DOB: Apr 07, 1946, 74 y.o.   MRN: [id number] S: Pt stable overnight off of CRRT.  New HD catheter placed by [facility] 09/01/19.  Required blood transfusion last night due to drop in Hgb. O:BP (!) 105/53   Pulse 81   Temp 99.4 F (37.4 C) (Oral)   Resp 18   Ht 5' 9"  (1.753 m)   Wt 98.5 kg   SpO2 99%   BMI 32.07 kg/m   Intake/Output Summary (Last 24 hours) at 09/02/2019 1023 Last data filed at 09/02/2019 0900 Gross per 24 hour  Intake 4304.82 ml  Output 865 ml  Net 3439.82 ml   Intake/Output: I/O last 3 completed shifts: In: 5358.7 [I.V.:803; Blood:315; NG/GT:2105; IV Piggyback:2135.7] Out: 3218 [Drains:150; VQQVZ:5638; Stool:675; Chest Tube:40]  Intake/Output this shift:  Total I/O In: 437.5 [I.V.:22.5; Blood:315; NG/GT:100] Out: 0  Weight change:  Gen: on vent via trach CVS: RRR, no rub Resp: occ rhonchi Abd: Distended, +BS, soft, NT Ext: + 1 edema of upper extremities  Recent Labs  Lab 08/29/19 0323 08/29/19 0323 08/29/19 1816 08/29/19 1816 08/30/19 0322 08/30/19 1605 08/31/19 0353 08/31/19 1729 09/01/19 0349 09/02/19 0123 09/02/19 0311  NA 136   < > 138   < > 136 136 135 138 138 139 137  K 4.0   < > 4.0   < > 4.4 3.9 4.0 4.1 4.3 4.2 3.9  CL 100   < > 105   < > 102 101 104 101 101 106 104  CO2 23   < > 21*   < > 21* 21* 23 24 22  20* 20*  GLUCOSE 158*   < > 131*   < > 135* 154* 138* 206* 209* 244* 238*  BUN 48*   < > 45*   < > 47* 46* 47* 50* 56* 86* 87*  CREATININE 2.46*   < > 2.68*   < > 2.33* 2.29* 2.35* 2.35* 2.57* 4.06* 4.33*  ALBUMIN 2.3*   < > 2.2*  --  2.3* 2.2* 2.3* 2.2* 2.2*  --  2.1*  CALCIUM 8.7*   < > 8.3*   < > 8.7* 8.5* 8.3* 8.3* 8.3* 8.1* 8.2*  PHOS 3.4  --  4.1  --  3.7 2.8 3.0 2.3* 3.1  --   --   AST  --   --   --   --   --   --   --   --   --   --  38  ALT  --   --   --   --   --   --   --   --   --   --  49*   < > = values in this interval not displayed.   Liver Function Tests: Recent Labs  Lab 08/31/19 1729  09/01/19 0349 09/02/19 0311  AST  --   --  38  ALT  --   --  49*  ALKPHOS  --   --  104  BILITOT  --   --  1.0  PROT  --   --  5.1*  ALBUMIN 2.2* 2.2* 2.1*   No results for input(s): LIPASE, AMYLASE in the last 168 hours. No results for input(s): AMMONIA in the last 168 hours. CBC: Recent Labs  Lab 08/29/19 7564 08/29/19 3329 08/30/19 0322 08/30/19 0322 08/31/19 0353 08/31/19 0353 09/01/19 0349 09/01/19 1758 09/02/19 0311  WBC 19.1*   < > 19.9*   < > 27.3*  --  33.6*  --  21.6*  NEUTROABS  --    < > 17.1*   < > 22.0*  --  25.7*  --  16.8*  HGB 7.7*   < > 7.8*   < > 7.9*   < > 7.4* 5.9* 6.4*  HCT 24.8*   < > 24.3*   < > 25.0*   < > 23.9* 19.4* 20.0*  MCV 96.1  --  95.3  --  96.9  --  98.0  --  96.2  PLT 229   < > 247   < > 298  --  265  --  184   < > = values in this interval not displayed.   Cardiac Enzymes: No results for input(s): CKTOTAL, CKMB, CKMBINDEX, TROPONINI in the last 168 hours. CBG: Recent Labs  Lab 09/01/19 1720 09/01/19 1952 09/01/19 2334 09/02/19 0313 09/02/19 0821  GLUCAP 206* 253* 230* 230* 238*    Iron Studies: No results for input(s): IRON, TIBC, TRANSFERRIN, FERRITIN in the last 72 hours. Studies/Results: DG CHEST PORT 1 VIEW  Result Date: 09/02/2019 CLINICAL DATA:  08/09/19 Admitted with NSTEM and ? Small RLL PE. Weight 213 pounds. 08/15/19 CABG x4 on 08/15/19 08/18/19 Cardiac arrest due to tamponade. Chest opened for washout 4/26 Chest washout in OR 4/29 CVVH started 5/5 OR for chest washout, tracheostomy EXAM: PORTABLE CHEST - 1 VIEW COMPARISON:  the previous day's study FINDINGS: Tracheostomy, left subclavian hemodialysis catheter, feeding tube, mediastinal drains stable. Interval removal of right subclavian central venous catheter. Persistent left basilar consolidation/atelectasis. 1.7 cm nodule laterally in the right mid lung as before. Stable cardiomegaly. Aorticopulmonary window is obscured. Exclude small layering left pleural effusion. No  pneumothorax. Visualized bones unremarkable. IMPRESSION: Stable left basilar consolidation/atelectasis and possible small effusion. Electronically Signed   By: Lucrezia Europe M.D.   On: 09/02/2019 08:57   DG CHEST PORT 1 VIEW  Result Date: 09/01/2019 CLINICAL DATA:  Central line placement EXAM: PORTABLE CHEST 1 VIEW COMPARISON:  09/01/2019 FINDINGS: Tracheostomy tube in satisfactory position. Nasogastric tube coursing below the diaphragm. Right subclavian central venous catheter with the tip projecting over the SVC. Left jugular sheath projecting over the confluence of the jugular vein and innominate. Mediastinal drain in unchanged position Bilateral chest tubes are again noted. No pneumothorax. Small left pleural effusion. Left basilar atelectasis. Ill-defined nodular airspace disease at the periphery of the right mid lung. Stable cardiomegaly. Status post CABG. No acute osseous abnormality. IMPRESSION: 1. Support lines and tubing in satisfactory position. 2. Small left pleural effusion with left basilar atelectasis. 3. Ill-defined nodular airspace disease at the periphery of the right mid lung. This likely reflects small pulmonary contusion or area of atelectasis, but attention on follow-up examination is recommended. Electronically Signed   By: Kathreen Devoid   On: 09/01/2019 16:44   DG Chest Port 1 View  Result Date: 09/01/2019 CLINICAL DATA:  Status post chest wound evacuation. Tracheostomy present. EXAM: PORTABLE CHEST 1 VIEW COMPARISON:  Aug 31, 2019 FINDINGS: Tracheostomy catheter tip is 6.5 cm above the carina. Feeding tube tip is below the diaphragm. Central catheter tip is in the superior vena cava. There are bilateral chest tubes and a mediastinal drain. No pneumothorax appreciable. There is persistent airspace opacity in the left lower lobe with fairly small left pleural effusion. Ill-defined opacity noted in right mid lung region, stable. Heart is mildly enlarged with pulmonary vascularity normal.  Patient is status post coronary artery bypass grafting. No bone lesions. IMPRESSION: Tube and catheter positions  as described without evident pneumothorax. Airspace opacity left lower lobe with small left pleural effusion. Suspect atelectasis in this area with potential superimposed pneumonia or possible aspiration. Ill-defined opacity in the periphery the right mid lung remains, likely of infectious etiology. No new opacity evident. Stable cardiomegaly. Electronically Signed   By: Lowella Grip III M.D.   On: 09/01/2019 08:26   . sodium chloride   Intravenous Once  . sodium chloride   Intravenous Once  . amiodarone  200 mg Per Tube BID  . aspirin  81 mg Per Tube Daily  . atorvastatin  40 mg Per Tube Daily  . B-complex with vitamin C  1 tablet Per Tube Daily  . bisacodyl  10 mg Oral Daily   Or  . bisacodyl  10 mg Rectal Daily  . chlorhexidine gluconate (MEDLINE KIT)  15 mL Mouth Rinse BID  . Chlorhexidine Gluconate Cloth  6 each Topical Daily  . clopidogrel  75 mg Per Tube Daily  . docusate  100 mg Per Tube BID  . feeding supplement (PRO-STAT SUGAR FREE 64)  60 mL Per Tube TID  . insulin aspart  0-24 Units Subcutaneous Q4H  . mouth rinse  15 mL Mouth Rinse 10 times per day  . metoCLOPramide (REGLAN) injection  5 mg Intravenous Q8H  . midodrine  10 mg Oral TID WC  . pantoprazole sodium  40 mg Per Tube Daily  . sodium chloride flush  10-40 mL Intracatheter Q12H  . sodium chloride flush  10-40 mL Intracatheter Q12H  . trospium  20 mg Per Tube BID  . vancomycin variable dose per unstable renal function (pharmacist dosing)   Does not apply See admin instructions    BMET    Component Value Date/Time   NA 137 09/02/2019 0311   NA 141 06/19/2019 1130   K 3.9 09/02/2019 0311   CL 104 09/02/2019 0311   CO2 20 (L) 09/02/2019 0311   GLUCOSE 238 (H) 09/02/2019 0311   BUN 87 (H) 09/02/2019 0311   BUN 21 06/19/2019 1130   CREATININE 4.33 (H) 09/02/2019 0311   CALCIUM 8.2 (L) 09/02/2019  0311   GFRNONAA 13 (L) 09/02/2019 0311   GFRAA 15 (L) 09/02/2019 0311   CBC    Component Value Date/Time   WBC 21.6 (H) 09/02/2019 0311   RBC 2.08 (L) 09/02/2019 0311   HGB 6.4 (LL) 09/02/2019 0311   HCT 20.0 (L) 09/02/2019 0311   PLT 184 09/02/2019 0311   MCV 96.2 09/02/2019 0311   MCH 30.8 09/02/2019 0311   MCHC 32.0 09/02/2019 0311   RDW 18.0 (H) 09/02/2019 0311   LYMPHSABS 1.1 09/02/2019 0311   MONOABS 1.5 (H) 09/02/2019 0311   EOSABS 0.3 09/02/2019 0311   BASOSABS 0.1 09/02/2019 0311    Assessment/Plan:  1. Shock- more hypotensive and rising WBC, worrisome for sepsis.  Discussed with Heart failure team and Dr. Orvan Seen. Lines replaced 09/01/19.  Awaiting cultures (negative to date), now on meripenem.  WBC improving overnight.  2. AKI- following cardiac cath, cabg, cardiogenic shock, cardiac arrest due to tamponade, and acute on chronic CHF. Started CRRT on 08/24/19 due to anasarca and need for volume removal to close his open chest.  CRRT was held on 09/01/19 with line changes. 1. Will resume CRRT today and UF as tolerated. 2. Left subclavian trialysis catheter placed by PCCM on 09/01/19 3. Resume 4K/2.5Ca for all replacement fluids.   4. UF goal 0-100 ml/hr depending upon BP response 5. CAD s/p CABG x  4 complicated by cardiac tamponade s/p reopened chest urgently on 08/18/19 with wound vac. 3. Acute systolic heart failure due to ischemic cardiomyopathy- volume markedly improved with CRRT. 4. ABLA- hgb dropped to 5.9.  S/p blood transfusion. Continue to follow 5. Pericardial tamponade- s/p urgent surgery and chest opening and now with wound vac.  For chest closure per Dr. Orvan Seen 6. Postoperative acute hypoxic respiratory failure- reintubated 08/18/19 and likely aspiration. 7. Anemia- stableand transfuse prn. 8. Ileus- had BM last night but still distended and tense. Per primary.  Donetta Potts, MD [employer] [phone]

## 2019-09-02 NOTE — Progress Notes (Signed)
Patient ID: Christian [name], male   DOB: [birth year], 74 y.o.   MRN: [id number]     Advanced Heart Failure Rounding Note  PCP-Cardiologist: No primary care provider on file.   Subjective:    08/09/19 Admitted with NSTEM and ? Small RLL PE. Weight 213 pounds.  08/15/19 CABG x4 on 08/15/19 08/18/19 Cardiac arrest due to tamponade. Chest opened for washout 4/26 Chest washout in OR 4/29 CVVH started 5/5 OR for chest washout, tracheostomy 5/7 HD catheter repalced   Has AKI. Suspect ATN from cardiogenic shock/pericardial tamponade. CVVH initiated 4/29 to assist w/ volume removal.   Remains on vent through trach. Awake watching TV. On CRRT pulling -100   Abx broadened yesterday for suspected sepsis. HD catheter changed. NE now off this am. On vasopressin 0.03. MAPs 90.  Co-ox 39%-> 56%   WBC 21k. Hgb down to 6.4    NSR this morning with PACs, remains on heparin gtt.  Amiodarone now po.   Echo 4/25 post-pericardial evacuation: EF 40-45% with some residual pericardial clot.  Objective:   Weight Range: 98.5 kg Body mass index is 32.07 kg/m.   Vital Signs:   Temp:  [97.8 F (36.6 C)-100.5 F (38.1 C)] 97.8 F (36.6 C) (05/08 1216) Pulse Rate:  [76-133] 89 (05/08 1215) Resp:  [18-36] 26 (05/08 1215) BP: (80-152)/(49-80) 132/70 (05/08 1215) SpO2:  [95 %-100 %] 100 % (05/08 1215) FiO2 (%):  [30 %] 30 % (05/08 1200) Last BM Date: 09/02/19(RT)  Weight change: Filed Weights   08/30/19 0600 08/31/19 0500 09/01/19 0451  Weight: 91.9 kg 93.1 kg 98.5 kg    Intake/Output:   Intake/Output Summary (Last 24 hours) at 09/02/2019 1300 Last data filed at 09/02/2019 1200 Gross per 24 hour  Intake 4179.74 ml  Output 996 ml  Net 3183.74 ml      Physical Exam   General:  Awake on vent via trach watching TV HEENT: normal + cortrak Neck: supple. + trach Carotids 2+ bilat; no bruits. No lymphadenopathy or thryomegaly appreciated. Cor: PMI nondisplaced. + wound vac  Regular rate & rhythm. No  rubs, gallops or murmurs. Lungs: clear Abdomen: soft, nontender, nondistended. No hepatosplenomegaly. No bruits or masses. Good bowel sounds. Extremities: no cyanosis, clubbing, rash, edema Neuro: Awake on vent via trach watching TV. Moves all 4    Telemetry   NSR 80-90s Personally reviewed  Labs    CBC Recent Labs    09/01/19 0349 09/01/19 1758 09/02/19 0311 09/02/19 0945  WBC 33.6*  --  21.6*  --   NEUTROABS 25.7*  --  16.8*  --   HGB 7.4*   < > 6.4* 7.5*  HCT 23.9*   < > 20.0* 23.6*  MCV 98.0  --  96.2  --   PLT 265  --  184  --    < > = values in this interval not displayed.   Basic Metabolic Panel Recent Labs    08/31/19 1729 08/31/19 1729 09/01/19 0349 09/01/19 0349 09/02/19 0123 09/02/19 0311  NA 138   < > 138   < > 139 137  K 4.1   < > 4.3   < > 4.2 3.9  CL 101   < > 101   < > 106 104  CO2 24   < > 22   < > 20* 20*  GLUCOSE 206*   < > 209*   < > 244* 238*  BUN 50*   < > 56*   < > 86* 87*  CREATININE 2.35*   < > 2.57*   < > 4.06* 4.33*  CALCIUM 8.3*   < > 8.3*   < > 8.1* 8.2*  MG  --   --  2.7*  --   --  2.6*  PHOS 2.3*  --  3.1  --   --   --    < > = values in this interval not displayed.   Liver Function Tests Recent Labs    09/01/19 0349 09/02/19 0311  AST  --  38  ALT  --  49*  ALKPHOS  --  104  BILITOT  --  1.0  PROT  --  5.1*  ALBUMIN 2.2* 2.1*   No results for input(s): LIPASE, AMYLASE in the last 72 hours. Cardiac Enzymes No results for input(s): CKTOTAL, CKMB, CKMBINDEX, TROPONINI in the last 72 hours.  BNP: BNP (last 3 results) Recent Labs    08/09/19 1240 09/02/19 0311  BNP 89.1 761.0*    ProBNP (last 3 results) Recent Labs    11/23/18 1120 08/09/19 0922  PROBNP 18.0 78.0     D-Dimer No results for input(s): DDIMER in the last 72 hours. Hemoglobin A1C No results for input(s): HGBA1C in the last 72 hours. Fasting Lipid Panel No results for input(s): CHOL, HDL, LDLCALC, TRIG, CHOLHDL, LDLDIRECT in the last 72  hours. Thyroid Function Tests No results for input(s): TSH, T4TOTAL, T3FREE, THYROIDAB in the last 72 hours.  Invalid input(s): FREET3  Other results:   Imaging    DG CHEST PORT 1 VIEW  Result Date: 09/02/2019 CLINICAL DATA:  08/09/19 Admitted with NSTEM and ? Small RLL PE. Weight 213 pounds. 08/15/19 CABG x4 on 08/15/19 08/18/19 Cardiac arrest due to tamponade. Chest opened for washout 4/26 Chest washout in OR 4/29 CVVH started 5/5 OR for chest washout, tracheostomy EXAM: PORTABLE CHEST - 1 VIEW COMPARISON:  the previous day's study FINDINGS: Tracheostomy, left subclavian hemodialysis catheter, feeding tube, mediastinal drains stable. Interval removal of right subclavian central venous catheter. Persistent left basilar consolidation/atelectasis. 1.7 cm nodule laterally in the right mid lung as before. Stable cardiomegaly. Aorticopulmonary window is obscured. Exclude small layering left pleural effusion. No pneumothorax. Visualized bones unremarkable. IMPRESSION: Stable left basilar consolidation/atelectasis and possible small effusion. Electronically Signed   By: Lucrezia Europe M.D.   On: 09/02/2019 08:57   DG CHEST PORT 1 VIEW  Result Date: 09/01/2019 CLINICAL DATA:  Central line placement EXAM: PORTABLE CHEST 1 VIEW COMPARISON:  09/01/2019 FINDINGS: Tracheostomy tube in satisfactory position. Nasogastric tube coursing below the diaphragm. Right subclavian central venous catheter with the tip projecting over the SVC. Left jugular sheath projecting over the confluence of the jugular vein and innominate. Mediastinal drain in unchanged position Bilateral chest tubes are again noted. No pneumothorax. Small left pleural effusion. Left basilar atelectasis. Ill-defined nodular airspace disease at the periphery of the right mid lung. Stable cardiomegaly. Status post CABG. No acute osseous abnormality. IMPRESSION: 1. Support lines and tubing in satisfactory position. 2. Small left pleural effusion with left basilar  atelectasis. 3. Ill-defined nodular airspace disease at the periphery of the right mid lung. This likely reflects small pulmonary contusion or area of atelectasis, but attention on follow-up examination is recommended. Electronically Signed   By: Kathreen Devoid   On: 09/01/2019 16:44     Medications:     Scheduled Medications: . sodium chloride   Intravenous Once  . sodium chloride   Intravenous Once  . amiodarone  200 mg Per Tube BID  .  aspirin  81 mg Per Tube Daily  . atorvastatin  40 mg Per Tube Daily  . B-complex with vitamin C  1 tablet Per Tube Daily  . bisacodyl  10 mg Oral Daily   Or  . bisacodyl  10 mg Rectal Daily  . chlorhexidine gluconate (MEDLINE KIT)  15 mL Mouth Rinse BID  . Chlorhexidine Gluconate Cloth  6 each Topical Daily  . clopidogrel  75 mg Per Tube Daily  . docusate  100 mg Per Tube BID  . feeding supplement (PRO-STAT SUGAR FREE 64)  60 mL Per Tube TID  . insulin aspart  0-24 Units Subcutaneous Q4H  . insulin detemir  10 Units Subcutaneous BID  . mouth rinse  15 mL Mouth Rinse 10 times per day  . midodrine  10 mg Oral TID WC  . pantoprazole sodium  40 mg Per Tube Daily  . sodium chloride flush  10-40 mL Intracatheter Q12H  . sodium chloride flush  10-40 mL Intracatheter Q12H  . trospium  20 mg Per Tube BID  . vancomycin variable dose per unstable renal function (pharmacist dosing)   Does not apply See admin instructions    Infusions: .  prismasol BGK 4/2.5 500 mL/hr at 09/02/19 1006  .  prismasol BGK 4/2.5 300 mL/hr at 09/02/19 1005  . sodium chloride Stopped (09/01/19 1008)  . feeding supplement (VITAL 1.5 CAL) 1,000 mL (09/02/19 0735)  . fluconazole (DIFLUCAN) IV    . heparin Stopped (09/01/19 0851)  . lactated ringers 20 mL/hr at 08/20/19 1200  . magnesium sulfate bolus IVPB    . meropenem (MERREM) IV    . norepinephrine (LEVOPHED) Adult infusion Stopped (09/02/19 0647)  . prismasol BGK 4/2.5 1,500 mL/hr at 09/02/19 1006  . vancomycin    .  vasopressin (PITRESSIN) infusion - *FOR SHOCK* 0.03 Units/min (09/02/19 1200)    PRN Medications: ALPRAZolam, cromolyn, dextrose, heparin, heparin, hydrALAZINE, HYDROmorphone (DILAUDID) injection, ondansetron (ZOFRAN) IV, oxyCODONE, sodium chloride flush, sodium chloride flush     Assessment/Plan   1. CAD: s/p PDA angioplasty + stent in 02/2014.  Admitted for NSTEMI 4/14. Hs Trop peaked at 3,309.  LHC w/ severe 3V CAD. Echo w/ reduced EF 30-35%. RV ok.  S/p CABG x 4 on 4/20 (LIMA-LAD, RIMA to PDA, Lt radial artery graft to 1st OM and distal OM branches of LCx).  Chest reopened urgently at the bedside for cardiac tamponade and removal of clot on 4/23  - continue ASA/Plavix.  Will need to stop one of the antiplatelet agents ultimately as he will be on anticoagulation at home.  - Atorvastatin 40 daily.   - Still have not been able to close chest, now has wound vac.  - No s/s angina 2. Acute Systolic Heart Failure: Ischemic Cardiomyopathy -> cardiogenic shock.  LVEF severely reduced at 30-35%, in the setting of NSTEM/ Multivessel CAD, RV ok initially.  Underwent urgent sternotomy at the bedside for cardiac tamponade and removal of clot on 4/23. Chest remains open. Luiz Blare numbers have been concerning for RV failure (swan now out).  Echo 4/24 EF 40 to 45% with inotropic support, small amount of residual pericardial clot, RV moderately hypokinetic.  On 5/7  Concern for developing septic shock with rising WBCs. Now improved with broadening abx. On VP 0.03. Off NE. MAPs 8-90s - Midodrine stopped. Wean VP as tolerated  - Keep CVVH around -50-100 cc/hr today      3.  Postoperative acute hypoxic respiratory failure: Reintubated on 4/23 due to cardiogenic shock  and return to OR. Now with tracheostomy, FiO2 0.3.  - Trach collar trials per CCM.  No change 4. AKI: Baseline SCr 0.9. Creatinine peaked to 5.3, suspect ATN in setting of cardiogenic shock/tamponade.  Now on CVVH for volume removal, 50 cc urine charted  yesterday.  - Remains on CVVHD as above. D/w Renal  5. Shock liver: Due to cardiac arrest on 4/23.  LFTs trending down.  6. 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 Maintaining NSR with PACs.   - Continue amiodarone per tube. - Heparin gtt currently while chest open.  Discussed dosing with PharmD personally. 7.  Possible small right lower lobe PE: Chest CT 4/14 w/ small RLL PE.  Bilateral Venous Dopplers negative for DVT.  - Heparin gtt has been started.   - Anticipate switching eliquis prior to d/c.  8. T2DM: Hgb A1c 7.7 - Remains on insulin 9. Pericardial tamponade: 4/23 return to OR for clot removal, chest wall left open. Patient had been on heparin gtt for DCCV of atrial flutter.  - Timing of chest closure per surgery.  10. Ileus: Improving, on Reglan. TFs ongoing.  - Continue Reglan 5 mg IV every 8 hrs.  11. Neuro: Following commands.  CT head negative. - Limit sedation  12. ID: Has been on vancomycin/cefepime with open chest. Cefepime switched to meropenem on 5/7 WBCs rising. Concern for infection/developing sepsis.  Blood cultures negative so far. Trach aspirate + rare pseudomonas  PCT 4.95. HD catheter changed 5/7.  - Continue vancomycin/meropenem - Follow cx 13. Post-op anemia - hgb down to 6.4. No obvious bleeding. Transfuse 1u RBC. Watch closely.   Length of Stay: 24  CRITICAL CARE Performed by: Glori Bickers  Total critical care time: 35 minutes  Critical care time was exclusive of separately billable procedures and treating other patients.  Critical care was necessary to treat or prevent imminent or life-threatening deterioration.  Critical care was time spent personally by me on the following activities: development of treatment plan with patient and/or surrogate as well as nursing, discussions with consultants, evaluation of patient's response to treatment, examination of patient, obtaining history from patient or surrogate, ordering and performing treatments  and interventions, ordering and review of laboratory studies, ordering and review of radiographic studies, pulse oximetry and re-evaluation of patient's condition.   Glori Bickers MD 09/02/2019 1:00 PM

## 2019-09-02 NOTE — Evaluation (Signed)
Occupational Therapy Evaluation Patient Details Name: RAMADAN COUEY MRN: [id number] DOB: 04-05-1946 Today's Date: 09/02/2019    History of Present Illness 74 yo admitted with NSTEMI 4/15, RLL PE, 4/20 CABG x 4 remained open with wound VAC with repeated washouts. 4/23 cardiac arrest, 4/ 29 initiated CRRT, vent s/p trach. PMHx: BPH, CAD, DM, HLD, HTN   Clinical Impression   Pt PTA: Pt living at home with significant other and pt reports independence with ADL, but pt with no family in room. Pt currently limited by decreased corodination and increased swelling in BLEs; decreased ability to care for self, questionable cognition and visual deficits on R side and  decreased activity tolerance. Pt egressed bed to chair position with feet on floor x15 mins for BUE exercise and ADL. Pt on 30% 5 PEEP, BP stable, HR stable. Pt would greatly benefit from continued OT skilled services for ADL, mobility and safety in CIR setting. OT followintg acutely.  LTACH could be appropriate-please address as needed.    Follow Up Recommendations  CIR;Supervision/Assistance - 24 hour    Equipment Recommendations  Other (comment)(defer to next facility)    Recommendations for Other Services Rehab consult     Precautions / Restrictions Precautions Precautions: Fall;Sternal Precaution Comments: CRRT, trach, vent, chest tubes, sternal VAC Restrictions Weight Bearing Restrictions: Yes Other Position/Activity Restrictions: sternal precautions      Mobility Bed Mobility Overal bed mobility: Needs Assistance             General bed mobility comments: Pt egressed bed to chair position with feet on floor x15 mins for BUE exercise and ADL.  Transfers                 General transfer comment: pt not yet appropriate to attempt    Balance Overall balance assessment: Needs assistance Sitting-balance support: Bilateral upper extremity supported Sitting balance-Leahy Scale: Zero Sitting balance -  Comments: Pt sitting in chair position with OTR pulling pt forward and pt iwth posterior lean and knees blocked with 0 sitting balance Postural control: Posterior lean;Left lateral lean                                 ADL either performed or assessed with clinical judgement   ADL Overall ADL's : Needs assistance/impaired Eating/Feeding: NPO Eating/Feeding Details (indicate cue type and reason): cortrak Grooming: Moderate assistance;Sitting Grooming Details (indicate cue type and reason): supported; RUE guided towards face to wash; cortrak is very sensitive Upper Body Bathing: Maximal assistance;Sitting;Bed level   Lower Body Bathing: Total assistance;Bed level   Upper Body Dressing : Maximal assistance;Sitting Upper Body Dressing Details (indicate cue type and reason): supported sitting Lower Body Dressing: Total assistance   Toilet Transfer: Total assistance   Toileting- Clothing Manipulation and Hygiene: Total assistance Toileting - Clothing Manipulation Details (indicate cue type and reason): flex seal     Functional mobility during ADLs: Total assistance General ADL Comments: Pt limited by decreased corodination and increased swelling in BLEs; decreased ability to care for self and decreased activity tolerance.     Vision Patient Visual Report: Peripheral vision impairment(Pt with decreased use of R eye movements with no head turn) Vision Assessment?: Vision impaired- to be further tested in functional context;Yes Eye Alignment: Within Functional Limits Ocular Range of Motion: Restricted on the right;Impaired-to be further tested in functional context Alignment/Gaze Preference: Gaze left(pt instructed to turn head to R) Tracking/Visual Pursuits: Left eye does  not track laterally;Impaired - to be further tested in functional context Additional Comments: Continue to assess     Perception     Praxis      Pertinent Vitals/Pain Pain Assessment: Faces Faces  Pain Scale: Hurts even more Pain Location: chest and L arm harvest site Pain Descriptors / Indicators: Discomfort;Tightness Pain Intervention(s): Monitored during session;Repositioned     Hand Dominance Right   Extremity/Trunk Assessment Upper Extremity Assessment Upper Extremity Assessment: Generalized weakness;RUE deficits/detail;LUE deficits/detail RUE Deficits / Details: 2/5 MM grade shoulder through digits; very poor coordination. RUE Coordination: decreased fine motor;decreased gross motor LUE Deficits / Details: Pt2-/5 MM grade with elbow, wrist and hand. Decreased ROM and coordination due to swelling/weakness. LUE Coordination: decreased gross motor;decreased fine motor   Lower Extremity Assessment Lower Extremity Assessment: Defer to PT evaluation;Generalized weakness   Cervical / Trunk Assessment Cervical / Trunk Assessment: Other exceptions(slumped posture)   Communication Communication Communication: Tracheostomy   Cognition Arousal/Alertness: Awake/alert Behavior During Therapy: WFL for tasks assessed/performed Overall Cognitive Status: Difficult to assess                                 General Comments: Pt's eyes would get big if in pain or trying to convey a point. Pt following simple commands.   General Comments  Pt on 30% 5 PEEP, BP stable, HR stable.    Exercises Exercises: General Upper Extremity General Exercises - Upper Extremity Shoulder Flexion: AAROM;Both;10 reps;Seated Elbow Flexion: AROM;AAROM;Both;10 reps;Seated;Supine Elbow Extension: AAROM;AROM;Both;10 reps;Seated;Supine Wrist Flexion: AROM;Both;10 reps;Supine Wrist Extension: AROM;10 reps;AAROM;Supine Digit Composite Flexion: AROM;Both;10 reps;Seated;Supine   Shoulder Instructions      Home Living Family/patient expects to be discharged to:: Private residence Living Arrangements: Spouse/significant other Available Help at Discharge: Family;Available 24 hours/day Type of  Home: House                       Home Equipment: None   Additional Comments: difficulty with PLOF as pt with trach and no family around.      Prior Functioning/Environment Level of Independence: Independent                 OT Problem List: Decreased strength;Decreased range of motion;Decreased activity tolerance;Impaired balance (sitting and/or standing);Decreased safety awareness;Impaired UE functional use;Pain;Increased edema;Decreased knowledge of use of DME or AE;Impaired vision/perception;Decreased coordination;Decreased cognition;Cardiopulmonary status limiting activity      OT Treatment/Interventions: Self-care/ADL training;Therapeutic exercise;Energy conservation;DME and/or AE instruction;Neuromuscular education;Therapeutic activities;Cognitive remediation/compensation;Visual/perceptual remediation/compensation;Patient/family education;Balance training    OT Goals(Current goals can be found in the care plan section) Acute Rehab OT Goals Patient Stated Goal: to get OOB OT Goal Formulation: With patient Time For Goal Achievement: 09/16/19 Potential to Achieve Goals: Good ADL Goals Pt Will Perform Grooming: with set-up;sitting Pt Will Transfer to Toilet: with mod assist;with +2 assist;squat pivot transfer Pt/caregiver will Perform Home Exercise Program: Increased strength;Increased ROM;Both right and left upper extremity;With written HEP provided Additional ADL Goal #1: Pt will increase to modA for bed mobility as precursor for OOB ADL and mobility. Additional ADL Goal #2: Pt will complete x7 mins of ADL task with 1 rest break in order to increase activity tolerance. Additional ADL Goal #3: Pt will follow (2) multi step commands with minimal cues using visual perception activities in order to increase awareness of possible visual deficits.  OT Frequency: Min 2X/week   Barriers to D/C:  Co-evaluation              AM-PAC OT "6 Clicks" Daily  Activity     Outcome Measure Help from another person eating meals?: Total Help from another person taking care of personal grooming?: A Lot Help from another person toileting, which includes using toliet, bedpan, or urinal?: Total Help from another person bathing (including washing, rinsing, drying)?: Total Help from another person to put on and taking off regular upper body clothing?: A Lot Help from another person to put on and taking off regular lower body clothing?: Total 6 Click Score: 8   End of Session Equipment Utilized During Treatment: Oxygen Nurse Communication: Mobility status  Activity Tolerance: Patient limited by fatigue;Patient limited by lethargy Patient left: in bed;with call bell/phone within reach;with bed alarm set;with nursing/sitter in room  OT Visit Diagnosis: Unsteadiness on feet (R26.81);Muscle weakness (generalized) (M62.81);Pain Pain - part of body: (chest)                Time: 9811-9147 OT Time Calculation (min): 31 min Charges:  OT General Charges $OT Visit: 1 Visit OT Evaluation $OT Eval Moderate Complexity: 1 Mod OT Treatments $Therapeutic Exercise: 8-22 mins  Jefferey Pica, OTR/L Acute Rehabilitation Services Pager: [phone] Office: [phone]   Georgeana Oertel C 09/02/2019, 4:26 PM

## 2019-09-02 NOTE — Progress Notes (Signed)
Patient ID: Christian [name], male   DOB: January 13, 1946, 74 y.o.   MRN: [id number] TCTS DAILY ICU PROGRESS NOTE                   [address]            [place] [address]          [phone]   4 Days Post-Op Procedure(s) (LRB): CHEST WOUND WASHOUT POST OPERATIVE OPEN HEART (N/A) Tracheostomy Wound Vac change   Date of Procedure:    08/15/2019 Preoperative Diagnosis:      Severe 3-vessel Coronary Artery Disease, Ischemic cardiomyopathy Postoperative Diagnosis:    Same Procedure:       Coronary Artery Bypass Grafting x 4             Left Internal Mammary Artery to Distal Left Anterior Descending Coronary Artery; pedicled RIMA Graft to Posterior Descending Coronary Artery; left radial artery Graft to 1st Obtuse Marginal Branch and distal OM branches of Left Circumflex Coronary Artery as sequenced great; left radial artery harvesting; bilateral IMA harvesting Completion indocyanine green fluorescence imaging Multilevel rib block with exparel solution  Total Length of Stay:  LOS: 24 days   Subjective: Patient remains trached on ventilator, lines were all changed yesterday, no new cultures are positive Required blood transfusion last night, heparin has been stopped Continues on Levophed for hemodynamic support Objective: Vital signs in last 24 hours: Temp:  [98.3 F (36.8 C)-100.5 F (38.1 C)] 99.4 F (37.4 C) (05/08 0800) Pulse Rate:  [78-133] 81 (05/08 0900) Cardiac Rhythm: Normal sinus rhythm (05/08 0715) Resp:  [18-38] 18 (05/08 0900) BP: (69-152)/(26-67) 105/53 (05/08 0900) SpO2:  [92 %-100 %] 99 % (05/08 0900) FiO2 (%):  [30 %] 30 % (05/08 0800)  Filed Weights   08/30/19 0600 08/31/19 0500 09/01/19 0451  Weight: 91.9 kg 93.1 kg 98.5 kg    Weight change:    Hemodynamic parameters for last 24 hours:    Intake/Output from previous day: 05/07 0701 - 05/08 0700 In: 4208.4 [I.V.:552.7; Blood:315; NG/GT:1505; IV Piggyback:1835.8] Out: 986 [Drains:150;  Stool:675; Chest Tube:40]  Intake/Output this shift: Total I/O In: 437.5 [I.V.:22.5; Blood:315; NG/GT:100] Out: 0   Current Meds: Scheduled Meds: . sodium chloride   Intravenous Once  . sodium chloride   Intravenous Once  . amiodarone  200 mg Per Tube BID  . aspirin  81 mg Per Tube Daily  . atorvastatin  40 mg Per Tube Daily  . B-complex with vitamin C  1 tablet Per Tube Daily  . bisacodyl  10 mg Oral Daily   Or  . bisacodyl  10 mg Rectal Daily  . chlorhexidine gluconate (MEDLINE KIT)  15 mL Mouth Rinse BID  . Chlorhexidine Gluconate Cloth  6 each Topical Daily  . clopidogrel  75 mg Per Tube Daily  . docusate  100 mg Per Tube BID  . feeding supplement (PRO-STAT SUGAR FREE 64)  60 mL Per Tube TID  . insulin aspart  0-24 Units Subcutaneous Q4H  . mouth rinse  15 mL Mouth Rinse 10 times per day  . metoCLOPramide (REGLAN) injection  5 mg Intravenous Q8H  . midodrine  10 mg Oral TID WC  . pantoprazole sodium  40 mg Per Tube Daily  . sodium chloride flush  10-40 mL Intracatheter Q12H  . sodium chloride flush  10-40 mL Intracatheter Q12H  . trospium  20 mg Per Tube BID  . vancomycin variable dose per unstable renal function (pharmacist  dosing)   Does not apply See admin instructions   Continuous Infusions: .  prismasol BGK 4/2.5    .  prismasol BGK 4/2.5    . sodium chloride Stopped (09/01/19 1008)  . feeding supplement (VITAL 1.5 CAL) 1,000 mL (09/02/19 0735)  . heparin Stopped (09/01/19 0851)  . lactated ringers 20 mL/hr at 08/20/19 1200  . magnesium sulfate bolus IVPB    . meropenem (MERREM) IV 1 g (09/02/19 0901)  . norepinephrine (LEVOPHED) Adult infusion Stopped (09/02/19 0647)  . prismasol BGK 4/2.5    . vasopressin (PITRESSIN) infusion - *FOR SHOCK* 0.03 Units/min (09/02/19 0900)   PRN Meds:.ALPRAZolam, cromolyn, dextrose, heparin, heparin, hydrALAZINE, HYDROmorphone (DILAUDID) injection, ondansetron (ZOFRAN) IV, oxyCODONE, sodium chloride flush, sodium chloride  flush  General appearance: alert and cooperative Neurologic: intact Heart: regular rate and rhythm, S1, S2 normal, no murmur, click, rub or gallop Lungs: diminished breath sounds bibasilar Abdomen: soft, non-tender; bowel sounds normal; no masses,  no organomegaly Extremities: Left arm radial incision healing  wound: Wound VAC in place and functioning  Lab Results: CBC: Recent Labs    09/01/19 0349 09/01/19 0349 09/01/19 1758 09/02/19 0311  WBC 33.6*  --   --  21.6*  HGB 7.4*   < > 5.9* 6.4*  HCT 23.9*   < > 19.4* 20.0*  PLT 265  --   --  184   < > = values in this interval not displayed.   BMET:  Recent Labs    09/02/19 0123 09/02/19 0311  NA 139 137  K 4.2 3.9  CL 106 104  CO2 20* 20*  GLUCOSE 244* 238*  BUN 86* 87*  CREATININE 4.06* 4.33*  CALCIUM 8.1* 8.2*    CMET: Lab Results  Component Value Date   WBC 21.6 (H) 09/02/2019   HGB 6.4 (LL) 09/02/2019   HCT 20.0 (L) 09/02/2019   PLT 184 09/02/2019   GLUCOSE 238 (H) 09/02/2019   CHOL 107 08/11/2019   TRIG 71 08/27/2019   HDL 42 08/11/2019   LDLDIRECT 40.0 11/23/2018   LDLCALC 40 08/11/2019   ALT 49 (H) 09/02/2019   AST 38 09/02/2019   NA 137 09/02/2019   K 3.9 09/02/2019   CL 104 09/02/2019   CREATININE 4.33 (H) 09/02/2019   BUN 87 (H) 09/02/2019   CO2 20 (L) 09/02/2019   TSH 2.590 04/19/2019   INR 1.3 (H) 08/19/2019   HGBA1C 7.7 (H) 08/14/2019      PT/INR: No results for input(s): LABPROT, INR in the last 72 hours. Radiology: DG CHEST PORT 1 VIEW  Result Date: 09/02/2019 CLINICAL DATA:  08/09/19 Admitted with NSTEM and ? Small RLL PE. Weight 213 pounds. 08/15/19 CABG x4 on 08/15/19 08/18/19 Cardiac arrest due to tamponade. Chest opened for washout 4/26 Chest washout in OR 4/29 CVVH started 5/5 OR for chest washout, tracheostomy EXAM: PORTABLE CHEST - 1 VIEW COMPARISON:  the previous day's study FINDINGS: Tracheostomy, left subclavian hemodialysis catheter, feeding tube, mediastinal drains stable.  Interval removal of right subclavian central venous catheter. Persistent left basilar consolidation/atelectasis. 1.7 cm nodule laterally in the right mid lung as before. Stable cardiomegaly. Aorticopulmonary window is obscured. Exclude small layering left pleural effusion. No pneumothorax. Visualized bones unremarkable. IMPRESSION: Stable left basilar consolidation/atelectasis and possible small effusion. Electronically Signed   By: Lucrezia Europe M.D.   On: 09/02/2019 08:57   DG CHEST PORT 1 VIEW  Result Date: 09/01/2019 CLINICAL DATA:  Central line placement EXAM: PORTABLE CHEST 1 VIEW COMPARISON:  09/01/2019  FINDINGS: Tracheostomy tube in satisfactory position. Nasogastric tube coursing below the diaphragm. Right subclavian central venous catheter with the tip projecting over the SVC. Left jugular sheath projecting over the confluence of the jugular vein and innominate. Mediastinal drain in unchanged position Bilateral chest tubes are again noted. No pneumothorax. Small left pleural effusion. Left basilar atelectasis. Ill-defined nodular airspace disease at the periphery of the right mid lung. Stable cardiomegaly. Status post CABG. No acute osseous abnormality. IMPRESSION: 1. Support lines and tubing in satisfactory position. 2. Small left pleural effusion with left basilar atelectasis. 3. Ill-defined nodular airspace disease at the periphery of the right mid lung. This likely reflects small pulmonary contusion or area of atelectasis, but attention on follow-up examination is recommended. Electronically Signed   By: Kathreen Devoid   On: 09/01/2019 16:44   Recent Results (from the past 240 hour(s))  Culture, respiratory (non-expectorated)     Status: None   Collection Time: 08/27/19  7:42 AM   Specimen: Tracheal Aspirate; Respiratory  Result Value Ref Range Status   Specimen Description TRACHEAL ASPIRATE  Final   Special Requests Immunocompromised  Final   Gram Stain   Final    RARE WBC PRESENT,  PREDOMINANTLY PMN RARE GRAM POSITIVE RODS    Culture   Final    FEW Consistent with normal respiratory flora. Performed at [facility], [place] [address], [place], [place] [address]    Report Status 08/29/2019 FINAL  Final  Culture, blood (routine x 2)     Status: None   Collection Time: 08/27/19 10:30 AM   Specimen: BLOOD RIGHT HAND  Result Value Ref Range Status   Specimen Description BLOOD RIGHT HAND  Final   Special Requests   Final    BOTTLES DRAWN AEROBIC AND ANAEROBIC Blood Culture adequate volume   Culture   Final    NO GROWTH 5 DAYS Performed at [facility], [place] [address], [place], [place] [address]    Report Status 09/01/2019 FINAL  Final  Culture, blood (routine x 2)     Status: None   Collection Time: 08/27/19 12:20 PM   Specimen: BLOOD RIGHT HAND  Result Value Ref Range Status   Specimen Description BLOOD RIGHT HAND  Final   Special Requests   Final    BOTTLES DRAWN AEROBIC ONLY Blood Culture results may not be optimal due to an inadequate volume of blood received in culture bottles   Culture   Final    NO GROWTH 5 DAYS Performed at [facility], [place] [address] [place], [place] [address]    Report Status 09/01/2019 FINAL  Final  Culture, blood (routine x 2)     Status: None (Preliminary result)   Collection Time: 08/31/19  8:50 AM   Specimen: BLOOD RIGHT HAND  Result Value Ref Range Status   Specimen Description BLOOD RIGHT HAND  Final   Special Requests   Final    BOTTLES DRAWN AEROBIC ONLY Blood Culture results may not be optimal due to an inadequate volume of blood received in culture bottles   Culture   Final    NO GROWTH 2 DAYS Performed at [facility], [place] [address], [place], [place] [address]    Report Status PENDING  Incomplete  Culture, blood (routine x 2)     Status: None (Preliminary result)   Collection Time: 08/31/19  9:01 AM   Specimen: BLOOD LEFT HAND  Result Value Ref Range Status   Specimen Description BLOOD  LEFT  HAND  Final   Special Requests   Final    BOTTLES DRAWN AEROBIC ONLY Blood Culture results may not be optimal due to an inadequate volume of blood received in culture bottles   Culture   Final    NO GROWTH 2 DAYS Performed at [facility], [place] [address] [place], [place] [address]    Report Status PENDING  Incomplete  Culture, respiratory (non-expectorated)     Status: None   Collection Time: 08/31/19  9:05 AM   Specimen: Tracheal Aspirate; Respiratory  Result Value Ref Range Status   Specimen Description TRACHEAL ASPIRATE  Final   Special Requests NONE  Final   Gram Stain   Final    NO WBC SEEN FEW GRAM NEGATIVE RODS RARE GRAM POSITIVE COCCI RARE GRAM POSITIVE RODS RARE GRAM VARIABLE ROD Performed at [facility], [place] [address], [place], [place] [address]    Culture RARE PSEUDOMONAS AERUGINOSA  Final   Report Status 09/02/2019 FINAL  Final   Organism ID, Bacteria PSEUDOMONAS AERUGINOSA  Final      Susceptibility   Pseudomonas aeruginosa - MIC*    CEFTAZIDIME <=1 SENSITIVE Sensitive     CIPROFLOXACIN <=0.25 SENSITIVE Sensitive     GENTAMICIN <=1 SENSITIVE Sensitive     IMIPENEM 2 SENSITIVE Sensitive     PIP/TAZO <=4 SENSITIVE Sensitive     CEFEPIME <=1 SENSITIVE Sensitive     * RARE PSEUDOMONAS AERUGINOSA    Assessment/Plan: S/P Procedure(s) (LRB): CHEST WOUND WASHOUT POST OPERATIVE OPEN HEART (N/A) Tracheostomy Wound Vac change Continues on vent Continues to require Levophed for blood pressure  White count down from yesterday-lines changed no new cultures With continued need for pressor support on the like the time the chest has been closed and in the setting of bilateral mammary harvest may be difficult to close the chest primarily    Christian [name] 09/02/2019 9:25 AM

## 2019-09-03 ENCOUNTER — Inpatient Hospital Stay (HOSPITAL_COMMUNITY): Payer: Medicare Other

## 2019-09-03 LAB — TYPE AND SCREEN
ABO/RH(D): O POS
ABO/RH(D): O POS
Antibody Screen: NEGATIVE
Antibody Screen: NEGATIVE
Unit division: 0
Unit division: 0

## 2019-09-03 LAB — RENAL FUNCTION PANEL
Albumin: 2.3 g/dL — ABNORMAL LOW (ref 3.5–5.0)
Albumin: 2.1 g/dL — ABNORMAL LOW (ref 3.5–5.0)
Anion gap: 12 (ref 5–15)
Anion gap: 10 (ref 5–15)
BUN: 53 mg/dL — ABNORMAL HIGH (ref 8–23)
BUN: 40 mg/dL — ABNORMAL HIGH (ref 8–23)
CO2: 24 mmol/L (ref 22–32)
CO2: 25 mmol/L (ref 22–32)
Calcium: 8.4 mg/dL — ABNORMAL LOW (ref 8.9–10.3)
Calcium: 8 mg/dL — ABNORMAL LOW (ref 8.9–10.3)
Chloride: 104 mmol/L (ref 98–111)
Chloride: 104 mmol/L (ref 98–111)
Creatinine, Ser: 2.87 mg/dL — ABNORMAL HIGH (ref 0.61–1.24)
Creatinine, Ser: 2.47 mg/dL — ABNORMAL HIGH (ref 0.61–1.24)
GFR calc Af Amer: 24 mL/min — ABNORMAL LOW (ref >60)
GFR calc Af Amer: 29 mL/min — ABNORMAL LOW (ref >60)
GFR calc non Af Amer: 21 mL/min — ABNORMAL LOW (ref >60)
GFR calc non Af Amer: 25 mL/min — ABNORMAL LOW (ref >60)
Glucose, Bld: 167 mg/dL — ABNORMAL HIGH (ref 70–99)
Glucose, Bld: 135 mg/dL — ABNORMAL HIGH (ref 70–99)
Phosphorus: 2.3 mg/dL — ABNORMAL LOW (ref 2.5–4.6)
Phosphorus: 2.3 mg/dL — ABNORMAL LOW (ref 2.5–4.6)
Potassium: 4.4 mmol/L (ref 3.5–5.1)
Potassium: 4.2 mmol/L (ref 3.5–5.1)
Sodium: 140 mmol/L (ref 135–145)
Sodium: 139 mmol/L (ref 135–145)

## 2019-09-03 LAB — GLUCOSE, CAPILLARY
Glucose-Capillary: 163 mg/dL — ABNORMAL HIGH (ref 70–99)
Glucose-Capillary: 168 mg/dL — ABNORMAL HIGH (ref 70–99)
Glucose-Capillary: 150 mg/dL — ABNORMAL HIGH (ref 70–99)
Glucose-Capillary: 126 mg/dL — ABNORMAL HIGH (ref 70–99)
Glucose-Capillary: 138 mg/dL — ABNORMAL HIGH (ref 70–99)
Glucose-Capillary: 159 mg/dL — ABNORMAL HIGH (ref 70–99)

## 2019-09-03 LAB — CBC
HCT: 27.5 % — ABNORMAL LOW (ref 39.0–52.0)
Hemoglobin: 8.8 g/dL — ABNORMAL LOW (ref 13.0–17.0)
MCH: 30.4 pg (ref 26.0–34.0)
MCHC: 32 g/dL (ref 30.0–36.0)
MCV: 95.2 fL (ref 80.0–100.0)
Platelets: 176 10*3/uL (ref 150–400)
RBC: 2.89 MIL/uL — ABNORMAL LOW (ref 4.22–5.81)
RDW: 19.1 % — ABNORMAL HIGH (ref 11.5–15.5)
WBC: 16.5 10*3/uL — ABNORMAL HIGH (ref 4.0–10.5)
nRBC: 1.9 % — ABNORMAL HIGH (ref 0.0–0.2)

## 2019-09-03 LAB — BPAM RBC
Blood Product Expiration Date: 202105222359
Blood Product Expiration Date: 202106032359
ISSUE DATE / TIME: 202105072108
ISSUE DATE / TIME: 202105080513
Unit Type and Rh: 5100
Unit Type and Rh: 5100

## 2019-09-03 LAB — MAGNESIUM: Magnesium: 2.6 mg/dL — ABNORMAL HIGH (ref 1.7–2.4)

## 2019-09-03 LAB — HEPARIN LEVEL (UNFRACTIONATED): Heparin Unfractionated: <0.1 IU/mL — ABNORMAL LOW (ref 0.30–0.70)

## 2019-09-03 MED ORDER — SODIUM CHLORIDE 0.9 % IV SOLN
1000.0000 [IU]/h | INTRAVENOUS | Status: DC
  Administered 2019-09-04 (×2): 1000 [IU]/h via INTRAVENOUS_CENTRAL
  Filled 2019-09-03: qty 10000
  Filled 2019-09-03 (×4): qty 2

## 2019-09-03 NOTE — Progress Notes (Signed)
Patient ID: Christian [name], male   DOB: 10/24/1945, 74 y.o.   MRN: [id number] TCTS DAILY ICU PROGRESS NOTE                   [address]            [place] [address]          [phone]   5 Days Post-Op Procedure(s) (LRB): CHEST WOUND WASHOUT POST OPERATIVE OPEN HEART (N/A) Tracheostomy Wound Vac change   Date of Procedure:    08/15/2019 Preoperative Diagnosis:      Severe 3-vessel Coronary Artery Disease, Ischemic cardiomyopathy Postoperative Diagnosis:    Same Procedure:       Coronary Artery Bypass Grafting x 4  Left Internal Mammary Artery to Distal Left Anterior Descending Coronary Artery; pedicled RIMA Graft to Posterior Descending Coronary Artery; left radial artery Graft to 1st Obtuse Marginal Branch and distal OM branches of Left Circumflex Coronary Artery as sequenced great; left radial artery harvesting; bilateral IMA harvesting Completion indocyanine green fluorescence imaging Multilevel rib block with exparel solution  Total Length of Stay:  LOS: 25 days   Subjective: Patient remains trached on ventilator, lines were all changed yesterday, no new cultures are positive Required blood transfusion  Friday and Saturday , heparin has been stopped except in cvh circuit  Now off drips  Improved from yesterday   Objective: Vital signs in last 24 hours: Temp:  [97.6 F (36.4 C)-99 F (37.2 C)] 99 F (37.2 C) (05/09 0754) Pulse Rate:  [37-100] 87 (05/09 1030) Cardiac Rhythm: Normal sinus rhythm (05/09 0730) Resp:  [14-34] 19 (05/09 1030) BP: (95-147)/(46-89) 111/68 (05/09 1030) SpO2:  [96 %-100 %] 100 % (05/09 1030) FiO2 (%):  [30 %] 30 % (05/09 0737) Weight:  [89 kg] 89 kg (05/09 0421)  Filed Weights   08/31/19 0500 09/01/19 0451 09/03/19 0421  Weight: 93.1 kg 98.5 kg 89 kg    Weight change:    Hemodynamic parameters for last 24 hours:    Intake/Output from previous day: 05/08 0701 - 05/09 0700 In: 2433.3 [I.V.:87.3; Blood:315;  NG/GT:1200; IV Piggyback:831] Out: 6389 [Drains:175; Stool:400; Chest Tube:50]  Intake/Output this shift: Total I/O In: 400 [Other:150; NG/GT:150; IV Piggyback:100] Out: 186 [Other:186]  Current Meds: Scheduled Meds: . sodium chloride   Intravenous Once  . sodium chloride   Intravenous Once  . amiodarone  200 mg Per Tube BID  . aspirin  81 mg Per Tube Daily  . atorvastatin  40 mg Per Tube Daily  . B-complex with vitamin C  1 tablet Per Tube Daily  . bisacodyl  10 mg Oral Daily   Or  . bisacodyl  10 mg Rectal Daily  . chlorhexidine gluconate (MEDLINE KIT)  15 mL Mouth Rinse BID  . Chlorhexidine Gluconate Cloth  6 each Topical Daily  . clopidogrel  75 mg Per Tube Daily  . docusate  100 mg Per Tube BID  . feeding supplement (PRO-STAT SUGAR FREE 64)  60 mL Per Tube TID  . insulin aspart  0-24 Units Subcutaneous Q4H  . insulin detemir  10 Units Subcutaneous BID  . mouth rinse  15 mL Mouth Rinse 10 times per day  . midodrine  10 mg Oral TID WC  . pantoprazole sodium  40 mg Per Tube Daily  . sodium chloride flush  10-40 mL Intracatheter Q12H  . sodium chloride flush  10-40 mL Intracatheter Q12H  . trospium  20 mg Per Tube BID  Continuous Infusions: .  prismasol BGK 4/2.5 500 mL/hr at 09/03/19 0727  .  prismasol BGK 4/2.5 300 mL/hr at 09/02/19 2200  . sodium chloride Stopped (09/01/19 1008)  . feeding supplement (VITAL 1.5 CAL) 50 mL/hr at 09/02/19 1500  . fluconazole (DIFLUCAN) IV Stopped (09/02/19 2356)  . heparin 10,000 units/ 20 mL infusion syringe    . lactated ringers 20 mL/hr at 08/20/19 1200  . magnesium sulfate bolus IVPB    . meropenem (MERREM) IV Stopped (09/03/19 0731)  . norepinephrine (LEVOPHED) Adult infusion Stopped (09/02/19 0647)  . prismasol BGK 4/2.5 1,500 mL/hr at 09/03/19 0728  . vancomycin Stopped (09/02/19 1805)  . vasopressin (PITRESSIN) infusion - *FOR SHOCK* Stopped (09/02/19 1634)   PRN Meds:.ALPRAZolam, cromolyn, dextrose, heparin, heparin,  hydrALAZINE, HYDROmorphone (DILAUDID) injection, ondansetron (ZOFRAN) IV, oxyCODONE, sodium chloride flush, sodium chloride flush  General appearance: alert and cooperative watching TV Neurologic: intact Heart: regular rate and rhythm, S1, S2 normal, no murmur, click, rub or gallop Lungs: diminished breath sounds bibasilar Abdomen: soft, non-tender; bowel sounds normal; no masses,  no organomegaly Extremities: Left arm radial incision healing  wound: Wound VAC in place and functioning  Lab Results: CBC: Recent Labs    09/02/19 0311 09/02/19 0945 09/02/19 2308 09/03/19 0337  WBC 21.6*  --   --  16.5*  HGB 6.4*   < > 8.4* 8.8*  HCT 20.0*   < > 26.8* 27.5*  PLT 184  --   --  176   < > = values in this interval not displayed.   BMET:  Recent Labs    09/02/19 1530 09/03/19 0337  NA 139 140  K 4.4 4.4  CL 105 104  CO2 22 24  GLUCOSE 138* 167*  BUN 75* 53*  CREATININE 3.94* 2.87*  CALCIUM 8.4* 8.4*    CMET: Lab Results  Component Value Date   WBC 16.5 (H) 09/03/2019   HGB 8.8 (L) 09/03/2019   HCT 27.5 (L) 09/03/2019   PLT 176 09/03/2019   GLUCOSE 167 (H) 09/03/2019   CHOL 107 08/11/2019   TRIG 71 08/27/2019   HDL 42 08/11/2019   LDLDIRECT 40.0 11/23/2018   LDLCALC 40 08/11/2019   ALT 49 (H) 09/02/2019   AST 38 09/02/2019   NA 140 09/03/2019   K 4.4 09/03/2019   CL 104 09/03/2019   CREATININE 2.87 (H) 09/03/2019   BUN 53 (H) 09/03/2019   CO2 24 09/03/2019   TSH 2.590 04/19/2019   INR 1.3 (H) 08/19/2019   HGBA1C 7.7 (H) 08/14/2019      PT/INR: No results for input(s): LABPROT, INR in the last 72 hours. Radiology: DG CHEST PORT 1 VIEW  Result Date: 09/03/2019 CLINICAL DATA:  Respiratory failure. EXAM: PORTABLE CHEST 1 VIEW COMPARISON:  Sep 02, 2019 FINDINGS: Tracheostomy catheter tip is 6.9 cm above the carina. Enteric tube tip is below the diaphragm. Central catheter tip is at the junction of the left innominate vein and superior vena cava. There is a right  chest tube and a mediastinal drain. No pneumothorax appreciable. There is persistent consolidation in the left lower lobe with left pleural effusion. Right lung is clear except for pulmonary nodular lesion noted in the periphery of the right mid lung. Heart is enlarged with pulmonary vascularity normal. Status post coronary artery bypass grafting. No adenopathy. No bone lesions. IMPRESSION: Tube and catheter positions as described without pneumothorax. Persistent airspace consolidation left lower lobe, likely due to pneumonia or potential aspiration with small pleural effusion on the  left. Stable nodular opacity right mid lung. No new opacity evident. Stable cardiac silhouette. Electronically Signed   By: Lowella Grip III M.D.   On: 09/03/2019 07:54   Recent Results (from the past 240 hour(s))  Culture, respiratory (non-expectorated)     Status: None   Collection Time: 08/27/19  7:42 AM   Specimen: Tracheal Aspirate; Respiratory  Result Value Ref Range Status   Specimen Description TRACHEAL ASPIRATE  Final   Special Requests Immunocompromised  Final   Gram Stain   Final    RARE WBC PRESENT, PREDOMINANTLY PMN RARE GRAM POSITIVE RODS    Culture   Final    FEW Consistent with normal respiratory flora. Performed at [facility], [place] [address] [place], [place] [address]    Report Status 08/29/2019 FINAL  Final  Culture, blood (routine x 2)     Status: None   Collection Time: 08/27/19 10:30 AM   Specimen: BLOOD RIGHT HAND  Result Value Ref Range Status   Specimen Description BLOOD RIGHT HAND  Final   Special Requests   Final    BOTTLES DRAWN AEROBIC AND ANAEROBIC Blood Culture adequate volume   Culture   Final    NO GROWTH 5 DAYS Performed at [facility], [place] [address], [place], [place] [address]    Report Status 09/01/2019 FINAL  Final  Culture, blood (routine x 2)     Status: None   Collection Time: 08/27/19 12:20 PM   Specimen: BLOOD RIGHT HAND  Result Value Ref  Range Status   Specimen Description BLOOD RIGHT HAND  Final   Special Requests   Final    BOTTLES DRAWN AEROBIC ONLY Blood Culture results may not be optimal due to an inadequate volume of blood received in culture bottles   Culture   Final    NO GROWTH 5 DAYS Performed at [facility], [place] [address] [place], [place] [address]    Report Status 09/01/2019 FINAL  Final  Culture, blood (routine x 2)     Status: None (Preliminary result)   Collection Time: 08/31/19  8:50 AM   Specimen: BLOOD RIGHT HAND  Result Value Ref Range Status   Specimen Description BLOOD RIGHT HAND  Final   Special Requests   Final    BOTTLES DRAWN AEROBIC ONLY Blood Culture results may not be optimal due to an inadequate volume of blood received in culture bottles   Culture   Final    NO GROWTH 3 DAYS Performed at [facility], [place] [address] [place], [place] [address]    Report Status PENDING  Incomplete  Culture, blood (routine x 2)     Status: None (Preliminary result)   Collection Time: 08/31/19  9:01 AM   Specimen: BLOOD LEFT HAND  Result Value Ref Range Status   Specimen Description BLOOD LEFT HAND  Final   Special Requests   Final    BOTTLES DRAWN AEROBIC ONLY Blood Culture results may not be optimal due to an inadequate volume of blood received in culture bottles   Culture   Final    NO GROWTH 3 DAYS Performed at [facility], [place] [address] [place],  [address]    Report Status PENDING  Incomplete  Culture, respiratory (non-expectorated)     Status: None   Collection Time: 08/31/19  9:05 AM   Specimen: Tracheal Aspirate; Respiratory  Result Value Ref Range Status   Specimen Description TRACHEAL ASPIRATE  Final  Special Requests NONE  Final   Gram Stain   Final    NO WBC SEEN FEW GRAM NEGATIVE RODS RARE GRAM POSITIVE COCCI RARE GRAM POSITIVE RODS RARE GRAM VARIABLE ROD Performed at [facility], [place] [address] [place],  [address]    Culture RARE  PSEUDOMONAS AERUGINOSA  Final   Report Status 09/02/2019 FINAL  Final   Organism ID, Bacteria PSEUDOMONAS AERUGINOSA  Final      Susceptibility   Pseudomonas aeruginosa - MIC*    CEFTAZIDIME <=1 SENSITIVE Sensitive     CIPROFLOXACIN <=0.25 SENSITIVE Sensitive     GENTAMICIN <=1 SENSITIVE Sensitive     IMIPENEM 2 SENSITIVE Sensitive     PIP/TAZO <=4 SENSITIVE Sensitive     CEFEPIME <=1 SENSITIVE Sensitive     * RARE PSEUDOMONAS AERUGINOSA    Assessment/Plan: S/P Procedure(s) (LRB): CHEST WOUND WASHOUT POST OPERATIVE OPEN HEART (N/A) Tracheostomy Wound Vac change Continues on vent Continues to require Levophed for blood pressure  White count continues to decrease  With some improvement in pressor support over the past 24 hours, consider reexploration of the sternum and possible closure tomorrow We will hold tube feedings tonight for possible closure tomorrow    Grace Ahaan 09/03/2019 10:42 AM Patient ID: Christian [name], male   DOB: 1945-09-03, 74 y.o.   MRN: [id number]

## 2019-09-03 NOTE — Progress Notes (Signed)
Patient ID: Christian [name], male   DOB: [birth year], 74 y.o.   MRN: [id number] EVENING ROUNDS NOTE :     [address]       [place] [address]             [phone]                 5 Days Post-Op Procedure(s) (LRB): CHEST WOUND WASHOUT POST OPERATIVE OPEN HEART (N/A) Tracheostomy Wound Vac change  Total Length of Stay:  LOS: 25 days  BP 103/69   Pulse 92   Temp 99 F (37.2 C) (Oral)   Resp (!) 26   Ht 5\' 9"  (1.753 m)   Wt 89 kg   SpO2 100%   BMI 28.98 kg/m   .Intake/Output      05/08 0701 - 05/09 0700 05/09 0701 - 05/10 0700   I.V. (mL/kg) 87.3 (1) 0 (0)   Blood 315    Other  150   NG/GT 1200 550   IV Piggyback 831 234.4   Total Intake(mL/kg) 2433.3 (27.3) 934.4 (10.5)   Urine (mL/kg/hr) 0 (0)    Drains 175 75   Other 2737 1251   Stool 400 200   Chest Tube 50 0   Total Output 3362 1526   Net -928.7 -591.6          .  prismasol BGK 4/2.5 500 mL/hr at 09/03/19 1400  .  prismasol BGK 4/2.5 300 mL/hr at 09/03/19 1400  . sodium chloride Stopped (09/01/19 1008)  . feeding supplement (VITAL 1.5 CAL) 50 mL/hr at 09/02/19 1500  . fluconazole (DIFLUCAN) IV Stopped (09/02/19 2356)  . heparin 10,000 units/ 20 mL infusion syringe Stopped (09/03/19 1048)  . lactated ringers 20 mL/hr at 08/20/19 1200  . magnesium sulfate bolus IVPB    . meropenem (MERREM) IV Stopped (09/03/19 1339)  . norepinephrine (LEVOPHED) Adult infusion Stopped (09/02/19 0647)  . prismasol BGK 4/2.5 1,500 mL/hr at 09/03/19 1200  . vancomycin 200 mL/hr at 09/03/19 1800  . vasopressin (PITRESSIN) infusion - *FOR SHOCK* Stopped (09/02/19 1634)     Lab Results  Component Value Date   WBC 16.5 (H) 09/03/2019   HGB 8.8 (L) 09/03/2019   HCT 27.5 (L) 09/03/2019   PLT 176 09/03/2019   GLUCOSE 135 (H) 09/03/2019   CHOL 107 08/11/2019   TRIG 71 08/27/2019   HDL 42 08/11/2019   LDLDIRECT 40.0 11/23/2018   LDLCALC 40 08/11/2019   ALT 49 (H) 09/02/2019   AST 38 09/02/2019   NA 139  09/03/2019   K 4.2 09/03/2019   CL 104 09/03/2019   CREATININE 2.47 (H) 09/03/2019   BUN 40 (H) 09/03/2019   CO2 25 09/03/2019   TSH 2.590 04/19/2019   INR 1.3 (H) 08/19/2019   HGBA1C 7.7 (H) 08/14/2019   We will plan mediastinal exploration possible sternal closure tomorrow, discussed risks and options with both the patient and his wife his wife is signed informed consent Tube feedings being held for tonight patient had stable day Off all pressors   Grace Jeffory MD  Beeper [phone] Office [phone] 09/03/2019 6:41 PM

## 2019-09-03 NOTE — Progress Notes (Signed)
Patient ID: Christian [name], male   DOB: 08-10-1945, 74 y.o.   MRN: [id number]     Advanced Heart Failure Rounding Note  PCP-Cardiologist: No primary care provider on file.   Subjective:    08/09/19 Admitted with NSTEM and ? Small RLL PE. Weight 213 pounds.  08/15/19 CABG x4 on 08/15/19 08/18/19 Cardiac arrest due to tamponade. Chest opened for washout 4/26 Chest washout in OR 4/29 CVVH started 5/5 OR for chest washout, tracheostomy 5/7 HD catheter repalced  Remains on vent through trach. Awake watching TV. On CRRT pulling -50-100   Abx broadened for suspected sepsis. HD catheter changed. VP stopped overnight.  BCx NGTD.   Trach aspirate + rare pseudomonas  WBC 21k -> 16. Hgb down to 6.4 -> 8.8 after RBCs. Heparin off except for CRT  Chest remains open. Wound vac in place   Echo 4/25 post-pericardial evacuation: EF 40-45% with some residual pericardial clot.  Objective:   Weight Range: 89 kg Body mass index is 28.98 kg/m.   Vital Signs:   Temp:  [97.6 F (36.4 C)-99 F (37.2 C)] 98.7 F (37.1 C) (05/09 1151) Pulse Rate:  [37-100] 92 (05/09 1300) Resp:  [14-33] 25 (05/09 1300) BP: (95-147)/(46-89) 127/83 (05/09 1300) SpO2:  [96 %-100 %] 100 % (05/09 1300) FiO2 (%):  [30 %] 30 % (05/09 1100) Weight:  [89 kg] 89 kg (05/09 0421) Last BM Date: 09/03/19  Weight change: Filed Weights   08/31/19 0500 09/01/19 0451 09/03/19 0421  Weight: 93.1 kg 98.5 kg 89 kg    Intake/Output:   Intake/Output Summary (Last 24 hours) at 09/03/2019 1306 Last data filed at 09/03/2019 1300 Gross per 24 hour  Intake 2200.78 ml  Output 3226 ml  Net -1025.22 ml      Physical Exam   General:  Awake on vent via trach watching TV HEENT: normal + cor trax Neck: supple. no JVD. + trach Carotids 2+ bilat; no bruits. No lymphadenopathy or thryomegaly appreciated. Cor: Chest open with wound vac. Regular + tunneled cath Lungs: clear Abdomen: soft, nontender, nondistended. No hepatosplenomegaly. No  bruits or masses. Good bowel sounds. Extremities: no cyanosis, clubbing, rash, trace edema Neuro: alert & orientedx3, cranial nerves grossly intact. moves all 4 extremities w/o difficulty. Affect pleasant   Telemetry   NSR 90s Personally reviewed  Labs    CBC Recent Labs    09/01/19 0349 09/01/19 1758 09/02/19 0311 09/02/19 0945 09/02/19 2308 09/03/19 0337  WBC 33.6*  --  21.6*  --   --  16.5*  NEUTROABS 25.7*  --  16.8*  --   --   --   HGB 7.4*   < > 6.4*   < > 8.4* 8.8*  HCT 23.9*   < > 20.0*   < > 26.8* 27.5*  MCV 98.0  --  96.2  --   --  95.2  PLT 265  --  184  --   --  176   < > = values in this interval not displayed.   Basic Metabolic Panel Recent Labs    09/02/19 0311 09/02/19 0311 09/02/19 1530 09/03/19 0337  NA 137   < > 139 140  K 3.9   < > 4.4 4.4  CL 104   < > 105 104  CO2 20*   < > 22 24  GLUCOSE 238*   < > 138* 167*  BUN 87*   < > 75* 53*  CREATININE 4.33*   < > 3.94* 2.87*  CALCIUM  8.2*   < > 8.4* 8.4*  MG 2.6*  --   --  2.6*  PHOS  --   --  2.9 2.3*   < > = values in this interval not displayed.   Liver Function Tests Recent Labs    09/02/19 0311 09/02/19 0311 09/02/19 1530 09/03/19 0337  AST 38  --   --   --   ALT 49*  --   --   --   ALKPHOS 104  --   --   --   BILITOT 1.0  --   --   --   PROT 5.1*  --   --   --   ALBUMIN 2.1*   < > 2.4* 2.3*   < > = values in this interval not displayed.   No results for input(s): LIPASE, AMYLASE in the last 72 hours. Cardiac Enzymes No results for input(s): CKTOTAL, CKMB, CKMBINDEX, TROPONINI in the last 72 hours.  BNP: BNP (last 3 results) Recent Labs    08/09/19 1240 09/02/19 0311  BNP 89.1 761.0*    ProBNP (last 3 results) Recent Labs    11/23/18 1120 08/09/19 0922  PROBNP 18.0 78.0     D-Dimer No results for input(s): DDIMER in the last 72 hours. Hemoglobin A1C No results for input(s): HGBA1C in the last 72 hours. Fasting Lipid Panel No results for input(s): CHOL, HDL,  LDLCALC, TRIG, CHOLHDL, LDLDIRECT in the last 72 hours. Thyroid Function Tests No results for input(s): TSH, T4TOTAL, T3FREE, THYROIDAB in the last 72 hours.  Invalid input(s): FREET3  Other results:   Imaging    DG CHEST PORT 1 VIEW  Result Date: 09/03/2019 CLINICAL DATA:  Respiratory failure. EXAM: PORTABLE CHEST 1 VIEW COMPARISON:  Sep 02, 2019 FINDINGS: Tracheostomy catheter tip is 6.9 cm above the carina. Enteric tube tip is below the diaphragm. Central catheter tip is at the junction of the left innominate vein and superior vena cava. There is a right chest tube and a mediastinal drain. No pneumothorax appreciable. There is persistent consolidation in the left lower lobe with left pleural effusion. Right lung is clear except for pulmonary nodular lesion noted in the periphery of the right mid lung. Heart is enlarged with pulmonary vascularity normal. Status post coronary artery bypass grafting. No adenopathy. No bone lesions. IMPRESSION: Tube and catheter positions as described without pneumothorax. Persistent airspace consolidation left lower lobe, likely due to pneumonia or potential aspiration with small pleural effusion on the left. Stable nodular opacity right mid lung. No new opacity evident. Stable cardiac silhouette. Electronically Signed   By: Lowella Grip III M.D.   On: 09/03/2019 07:54     Medications:     Scheduled Medications: . sodium chloride   Intravenous Once  . sodium chloride   Intravenous Once  . amiodarone  200 mg Per Tube BID  . aspirin  81 mg Per Tube Daily  . atorvastatin  40 mg Per Tube Daily  . B-complex with vitamin C  1 tablet Per Tube Daily  . bisacodyl  10 mg Oral Daily   Or  . bisacodyl  10 mg Rectal Daily  . chlorhexidine gluconate (MEDLINE KIT)  15 mL Mouth Rinse BID  . Chlorhexidine Gluconate Cloth  6 each Topical Daily  . clopidogrel  75 mg Per Tube Daily  . docusate  100 mg Per Tube BID  . feeding supplement (PRO-STAT SUGAR FREE 64)  60  mL Per Tube TID  . insulin aspart  0-24 Units  Subcutaneous Q4H  . insulin detemir  10 Units Subcutaneous BID  . mouth rinse  15 mL Mouth Rinse 10 times per day  . midodrine  10 mg Oral TID WC  . pantoprazole sodium  40 mg Per Tube Daily  . sodium chloride flush  10-40 mL Intracatheter Q12H  . sodium chloride flush  10-40 mL Intracatheter Q12H  . trospium  20 mg Per Tube BID    Infusions: .  prismasol BGK 4/2.5 500 mL/hr at 09/03/19 0727  .  prismasol BGK 4/2.5 300 mL/hr at 09/02/19 2200  . sodium chloride Stopped (09/01/19 1008)  . feeding supplement (VITAL 1.5 CAL) 50 mL/hr at 09/02/19 1500  . fluconazole (DIFLUCAN) IV Stopped (09/02/19 2356)  . heparin 10,000 units/ 20 mL infusion syringe Stopped (09/03/19 1048)  . lactated ringers 20 mL/hr at 08/20/19 1200  . magnesium sulfate bolus IVPB    . meropenem (MERREM) IV 1 g (09/03/19 1306)  . norepinephrine (LEVOPHED) Adult infusion Stopped (09/02/19 0647)  . prismasol BGK 4/2.5 1,500 mL/hr at 09/03/19 0728  . vancomycin Stopped (09/02/19 1805)  . vasopressin (PITRESSIN) infusion - *FOR SHOCK* Stopped (09/02/19 1634)    PRN Medications: ALPRAZolam, cromolyn, dextrose, heparin, heparin, hydrALAZINE, HYDROmorphone (DILAUDID) injection, ondansetron (ZOFRAN) IV, oxyCODONE, sodium chloride flush, sodium chloride flush     Assessment/Plan   1. CAD: s/p PDA angioplasty + stent in 02/2014.  Admitted for NSTEMI 4/14. Hs Trop peaked at 3,309.  LHC w/ severe 3V CAD. Echo w/ reduced EF 30-35%. RV ok.  S/p CABG x 4 on 4/20 (LIMA-LAD, RIMA to PDA, Lt radial artery graft to 1st OM and distal OM branches of LCx).  Chest reopened urgently at the bedside for cardiac tamponade and removal of clot on 4/23  - continue ASA/Plavix.  Will need to stop one of the antiplatelet agents ultimately as he will be on anticoagulation at home.  - Atorvastatin 40 daily.   - Still have not been able to close chest, now has wound vac.  - No s/s angina - No change  2. Acute Systolic Heart Failure: Ischemic Cardiomyopathy -> cardiogenic shock.  LVEF severely reduced at 30-35%, in the setting of NSTEM/ Multivessel CAD, RV ok initially.  Underwent urgent sternotomy at the bedside for cardiac tamponade and removal of clot on 4/23. Chest remains open. Luiz Blare numbers have been concerning for RV failure (swan now out).  Echo 4/24 EF 40 to 45% with inotropic support, small amount of residual pericardial clot, RV moderately hypokinetic.  On 5/7  Concern for developing septic shock with rising WBCs. Now improved with broadening abx. Off VP overnight,  - Midodrine stopped. BP stable off pressors this am  - Keep CVVH around -50-100 cc/hr today. Weight now down below baseline.  3.  Postoperative acute hypoxic respiratory failure: Reintubated on 4/23 due to cardiogenic shock and return to OR. Now with tracheostomy, FiO2 0.3.  - PS trials per CCM during the day. Full vent at night,  4. AKI: Baseline SCr 0.9. Creatinine peaked to 5.3, suspect ATN in setting of cardiogenic shock/tamponade.  Now on CVVH for volume removal, 50 cc urine charted yesterday.  - Remains on CVVHD as above. D/w Renal  5. Shock liver: Due to cardiac arrest on 4/23.  LFTs trending down.  6. 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 Maintaining NSR with PACs.   - Continue amiodarone per tube. - Off heparin due to bleeding. Consider NOAC prior to d/c  7.  Possible small right lower lobe PE:  Chest CT 4/14 w/ small RLL PE.  Bilateral Venous Dopplers negative for DVT.  - Off heparin due to bleeding. Consider NOAC prior to d/c  8. T2DM: Hgb A1c 7.7 - Remains on insulin 9. Pericardial tamponade: 4/23 return to OR for clot removal, chest wall left open. Patient had been on heparin gtt for DCCV of atrial flutter.  - Timing of chest closure per surgery. D/w Dr. Servando Snare this am. Suspect he would be ready in am 10. ID: Has been on vancomycin/cefepime with open chest. Cefepime switched to meropenem on 5/7 WBCs  rising. Concern for infection/developing sepsis.  Blood cultures negative so far. Trach aspirate + rare pseudomonas  PCT 4.95. HD catheter changed 5/7.  - Continue vancomycin/meropenem. Could possibly switch to Zosyn  - Cx negative.  13. Post-op anemia - hgb down to 6.4 yesterday -> 8.8 after transfusion  No obvious bleeding.  - heparinoff  Length of Stay: 25  CRITICAL CARE Performed by: Glori Bickers  Total critical care time: 35 minutes  Critical care time was exclusive of separately billable procedures and treating other patients.  Critical care was necessary to treat or prevent imminent or life-threatening deterioration.  Critical care was time spent personally by me on the following activities: development of treatment plan with patient and/or surrogate as well as nursing, discussions with consultants, evaluation of patient's response to treatment, examination of patient, obtaining history from patient or surrogate, ordering and performing treatments and interventions, ordering and review of laboratory studies, ordering and review of radiographic studies, pulse oximetry and re-evaluation of patient's condition.   Glori Bickers MD 09/03/2019 1:06 PM

## 2019-09-03 NOTE — Progress Notes (Signed)
NAME:  Christian [name], MRN:  [id number], DOB:  1945-10-06, LOS: 40 ADMISSION DATE:  08/09/2019, CONSULTATION DATE:  08/28/2019 REFERRING MD:  Orvan Seen - TRH, CHIEF COMPLAINT:  Respiratory failure.    HPI/course in hospital   74 year old man who presented with progressive dyspnea.  He was admitted 4/14 and was incidentally found to have a right lower lobe pulmonary embolism.  Overall the picture was that of decompensated heart failure with a non-STEMI.  Coronary angiography revealed triple-vessel disease and the patient was referred for cardiac surgery.  EF was found to be reduced at that time at  He underwent four-vessel CABG 4/20 with good quality distal targets.  Cardiac function remained reduced post bypass.  Unable to close chest due to surrounding edema.  Underwent attempted closure with VAC placement 4/22.   Cardiac arrest 4/23 with repeated washouts with VAC placements on 4/26 and 4/29.  Significant volume overload postoperatively.  Acute kidney injury.  Currently on CRRT for fluid removal.  08/09/19 Admitted with NSTEM and ? Small RLL PE. Weight 213 pounds.  08/15/19 CABG x4 on 08/15/19 08/18/19 Cardiac arrest due to tamponade. Chest opened for washout 4/26 Chest washout in OR 4/29 CVVH started 5/5 OR for chest washout, tracheostomy    Interim history/subjective:  Doing better today. Pressors off. Denies pain.  Objective   Blood pressure (!) 144/68, pulse 89, temperature 99 F (37.2 C), temperature source Axillary, resp. rate (!) 27, height 5\' 9"  (1.753 m), weight 89 kg, SpO2 100 %.    Vent Mode: PSV;CPAP FiO2 (%):  [30 %] 30 % Set Rate:  [18 bmp] 18 bmp Vt Set:  [560 mL] 560 mL PEEP:  [5 cmH20] 5 cmH20 Pressure Support:  [12 cmH20] 12 cmH20 Plateau Pressure:  [19 cmH20-23 cmH20] 23 cmH20   Intake/Output Summary (Last 24 hours) at 09/03/2019 0945 Last data filed at 09/03/2019 0900 Gross per 24 hour  Intake 2345.84 ml  Output 3448 ml  Net -1102.16 ml   Filed Weights     08/31/19 0500 09/01/19 0451 09/03/19 0421  Weight: 93.1 kg 98.5 kg 89 kg    Examination: GEN: No acute distress lying in bed HEENT: Tracheostomy in place with small secretions CV: Heart sounds regular, extremities warm PULM: less rhonci today, good TV on PS GI: Protuberant, soft, positive bowel sounds, brown stool in rectal bag EXT: He has diffuse mild anasarca, left upper extremity vein graft site healing well NEURO: Moves all 4 extremities to command, globally weak PSYCH: RASS 0 SKIN: No rashes, sternum remains open  Trach aspirate 5/6 with pan sensitive pseudomonas Blood cx neg Echo 4/24: EF 45% with Grade 2 DD.   L [place] temporary HD line R midline PIV Mediastinal drain Rectal tube NGT Wound vac over sternum  Assessment & Plan:  Postoperative persistent respiratory failure after CABG and multiple mediastinal washouts, inability to close chest.  S/P trach. - PS trials as able during day - Pull off fluid as able with CRRT - Full vent at night  Pseudomonas tracheiitis vs. Pneumonia - Consider narrowing antibiotics  Acute blood loss anemia- unclear origin - better after transfusion  RLL pulmonary embolism noted mid April, afib - Needs to be back on [facility] at some point, per primary - Continue amiodarone  Deconditioning related to prolonged ICU stay - Progressive mobility as able  Acute tubular necrosis now HD dependent, generalized anasarca, inability to close chest - CRRT with negative fluid as able  Vasoplegia vs. Cardiac stunning postop - Continue  midodrine and pressors as needed to maintain MAP > 65  Hyperglycemia- better continue current regimen, will consider TF coverage tomorrow  Best practice:  Diet: TF Pain/Anxiety/Delirium protocol (if indicated): PRNs written for VAP protocol (if indicated): in place DVT prophylaxis: SCDs GI prophylaxis: PPI Glucose control: SSI, levemir Mobility: BR Code Status: full Family Communication: per primary Disposition:  ICU  Erskine Emery MD [facility] 09/03/2019 9:45 AM Personal pager: [phone] If unanswered, please page CCM On-call: [phone]

## 2019-09-03 NOTE — Progress Notes (Signed)
Patient ID: RIGGIN CUTTINO, male   DOB: [birth year], 74 y.o.   MRN: [id number] S: No events overnight. O:BP 111/68   Pulse 87   Temp 99 F (37.2 C) (Axillary)   Resp 19   Ht 5' 9"  (1.753 m)   Wt 89 kg   SpO2 100%   BMI 28.98 kg/m   Intake/Output Summary (Last 24 hours) at 09/03/2019 1039 Last data filed at 09/03/2019 1000 Gross per 24 hour  Intake 2234.5 ml  Output 3538 ml  Net -1303.5 ml   Intake/Output: I/O last 3 completed shifts: In: 3743.4 [I.V.:289; Blood:630; NG/GT:1800; IV Piggyback:1024.4] Out: 3702 [Drains:275; WUJWJ:1914; Stool:600; Chest Tube:90]  Intake/Output this shift:  Total I/O In: 400 [Other:150; NG/GT:150; IV Piggyback:100] Out: 186 [Other:186] Weight change:  Gen: on vent via trach CVS: RRR, no rub, open chest wound with wound vac in place Resp: cta Abd: distended, +BS, soft, NT Ext: 1+ edema  Recent Labs  Lab 08/30/19 0322 08/30/19 0322 08/30/19 1605 08/30/19 1605 08/31/19 0353 08/31/19 1729 09/01/19 0349 09/02/19 0123 09/02/19 0311 09/02/19 1530 09/03/19 0337  NA 136   < > 136   < > 135 138 138 139 137 139 140  K 4.4   < > 3.9   < > 4.0 4.1 4.3 4.2 3.9 4.4 4.4  CL 102   < > 101   < > 104 101 101 106 104 105 104  CO2 21*   < > 21*   < > 23 24 22  20* 20* 22 24  GLUCOSE 135*   < > 154*   < > 138* 206* 209* 244* 238* 138* 167*  BUN 47*   < > 46*   < > 47* 50* 56* 86* 87* 75* 53*  CREATININE 2.33*   < > 2.29*   < > 2.35* 2.35* 2.57* 4.06* 4.33* 3.94* 2.87*  ALBUMIN 2.3*   < > 2.2*  --  2.3* 2.2* 2.2*  --  2.1* 2.4* 2.3*  CALCIUM 8.7*   < > 8.5*   < > 8.3* 8.3* 8.3* 8.1* 8.2* 8.4* 8.4*  PHOS 3.7  --  2.8  --  3.0 2.3* 3.1  --   --  2.9 2.3*  AST  --   --   --   --   --   --   --   --  38  --   --   ALT  --   --   --   --   --   --   --   --  49*  --   --    < > = values in this interval not displayed.   Liver Function Tests: Recent Labs  Lab 09/02/19 0311 09/02/19 1530 09/03/19 0337  AST 38  --   --   ALT 49*  --   --   ALKPHOS 104  --    --   BILITOT 1.0  --   --   PROT 5.1*  --   --   ALBUMIN 2.1* 2.4* 2.3*   No results for input(s): LIPASE, AMYLASE in the last 168 hours. No results for input(s): AMMONIA in the last 168 hours. CBC: Recent Labs  Lab 08/30/19 0322 08/30/19 0322 08/31/19 0353 08/31/19 0353 09/01/19 0349 09/01/19 1758 09/02/19 0311 09/02/19 0311 09/02/19 0945 09/02/19 2308 09/03/19 0337  WBC 19.9*   < > 27.3*   < > 33.6*  --  21.6*  --   --   --  16.5*  NEUTROABS 17.1*   < > 22.0*  --  25.7*  --  16.8*  --   --   --   --   HGB 7.8*   < > 7.9*   < > 7.4*   < > 6.4*   < > 7.5* 8.4* 8.8*  HCT 24.3*   < > 25.0*   < > 23.9*   < > 20.0*   < > 23.6* 26.8* 27.5*  MCV 95.3  --  96.9  --  98.0  --  96.2  --   --   --  95.2  PLT 247   < > 298   < > 265  --  184  --   --   --  176   < > = values in this interval not displayed.   Cardiac Enzymes: No results for input(s): CKTOTAL, CKMB, CKMBINDEX, TROPONINI in the last 168 hours. CBG: Recent Labs  Lab 09/02/19 1202 09/02/19 1942 09/02/19 2332 09/03/19 0323 09/03/19 0739  GLUCAP 188* 185* 188* 163* 168*    Iron Studies: No results for input(s): IRON, TIBC, TRANSFERRIN, FERRITIN in the last 72 hours. Studies/Results: DG CHEST PORT 1 VIEW  Result Date: 09/03/2019 CLINICAL DATA:  Respiratory failure. EXAM: PORTABLE CHEST 1 VIEW COMPARISON:  Sep 02, 2019 FINDINGS: Tracheostomy catheter tip is 6.9 cm above the carina. Enteric tube tip is below the diaphragm. Central catheter tip is at the junction of the left innominate vein and superior vena cava. There is a right chest tube and a mediastinal drain. No pneumothorax appreciable. There is persistent consolidation in the left lower lobe with left pleural effusion. Right lung is clear except for pulmonary nodular lesion noted in the periphery of the right mid lung. Heart is enlarged with pulmonary vascularity normal. Status post coronary artery bypass grafting. No adenopathy. No bone lesions. IMPRESSION: Tube and  catheter positions as described without pneumothorax. Persistent airspace consolidation left lower lobe, likely due to pneumonia or potential aspiration with small pleural effusion on the left. Stable nodular opacity right mid lung. No new opacity evident. Stable cardiac silhouette. Electronically Signed   By: Lowella Grip III M.D.   On: 09/03/2019 07:54   DG CHEST PORT 1 VIEW  Result Date: 09/02/2019 CLINICAL DATA:  08/09/19 Admitted with NSTEM and ? Small RLL PE. Weight 213 pounds. 08/15/19 CABG x4 on 08/15/19 08/18/19 Cardiac arrest due to tamponade. Chest opened for washout 4/26 Chest washout in OR 4/29 CVVH started 5/5 OR for chest washout, tracheostomy EXAM: PORTABLE CHEST - 1 VIEW COMPARISON:  the previous day's study FINDINGS: Tracheostomy, left subclavian hemodialysis catheter, feeding tube, mediastinal drains stable. Interval removal of right subclavian central venous catheter. Persistent left basilar consolidation/atelectasis. 1.7 cm nodule laterally in the right mid lung as before. Stable cardiomegaly. Aorticopulmonary window is obscured. Exclude small layering left pleural effusion. No pneumothorax. Visualized bones unremarkable. IMPRESSION: Stable left basilar consolidation/atelectasis and possible small effusion. Electronically Signed   By: Lucrezia Europe M.D.   On: 09/02/2019 08:57   DG CHEST PORT 1 VIEW  Result Date: 09/01/2019 CLINICAL DATA:  Central line placement EXAM: PORTABLE CHEST 1 VIEW COMPARISON:  09/01/2019 FINDINGS: Tracheostomy tube in satisfactory position. Nasogastric tube coursing below the diaphragm. Right subclavian central venous catheter with the tip projecting over the SVC. Left jugular sheath projecting over the confluence of the jugular vein and innominate. Mediastinal drain in unchanged position Bilateral chest tubes are again noted. No pneumothorax. Small left pleural effusion. Left basilar atelectasis. Ill-defined nodular  airspace disease at the periphery of the right mid  lung. Stable cardiomegaly. Status post CABG. No acute osseous abnormality. IMPRESSION: 1. Support lines and tubing in satisfactory position. 2. Small left pleural effusion with left basilar atelectasis. 3. Ill-defined nodular airspace disease at the periphery of the right mid lung. This likely reflects small pulmonary contusion or area of atelectasis, but attention on follow-up examination is recommended. Electronically Signed   By: Kathreen Devoid   On: 09/01/2019 16:44   . sodium chloride   Intravenous Once  . sodium chloride   Intravenous Once  . amiodarone  200 mg Per Tube BID  . aspirin  81 mg Per Tube Daily  . atorvastatin  40 mg Per Tube Daily  . B-complex with vitamin C  1 tablet Per Tube Daily  . bisacodyl  10 mg Oral Daily   Or  . bisacodyl  10 mg Rectal Daily  . chlorhexidine gluconate (MEDLINE KIT)  15 mL Mouth Rinse BID  . Chlorhexidine Gluconate Cloth  6 each Topical Daily  . clopidogrel  75 mg Per Tube Daily  . docusate  100 mg Per Tube BID  . feeding supplement (PRO-STAT SUGAR FREE 64)  60 mL Per Tube TID  . insulin aspart  0-24 Units Subcutaneous Q4H  . insulin detemir  10 Units Subcutaneous BID  . mouth rinse  15 mL Mouth Rinse 10 times per day  . midodrine  10 mg Oral TID WC  . pantoprazole sodium  40 mg Per Tube Daily  . sodium chloride flush  10-40 mL Intracatheter Q12H  . sodium chloride flush  10-40 mL Intracatheter Q12H  . trospium  20 mg Per Tube BID    BMET    Component Value Date/Time   NA 140 09/03/2019 0337   NA 141 06/19/2019 1130   K 4.4 09/03/2019 0337   CL 104 09/03/2019 0337   CO2 24 09/03/2019 0337   GLUCOSE 167 (H) 09/03/2019 0337   BUN 53 (H) 09/03/2019 0337   BUN 21 06/19/2019 1130   CREATININE 2.87 (H) 09/03/2019 0337   CALCIUM 8.4 (L) 09/03/2019 0337   GFRNONAA 21 (L) 09/03/2019 0337   GFRAA 24 (L) 09/03/2019 0337   CBC    Component Value Date/Time   WBC 16.5 (H) 09/03/2019 0337   RBC 2.89 (L) 09/03/2019 0337   HGB 8.8 (L) 09/03/2019  0337   HCT 27.5 (L) 09/03/2019 0337   PLT 176 09/03/2019 0337   MCV 95.2 09/03/2019 0337   MCH 30.4 09/03/2019 0337   MCHC 32.0 09/03/2019 0337   RDW 19.1 (H) 09/03/2019 0337   LYMPHSABS 1.1 09/02/2019 0311   MONOABS 1.5 (H) 09/02/2019 0311   EOSABS 0.3 09/02/2019 0311   BASOSABS 0.1 09/02/2019 0311     Assessment/Plan:  1. Shock- more hypotensive and rising WBC, worrisome for sepsis. Discussed with Heart failure team and Dr. Orvan Seen. Lines replaced 09/01/19.  Awaiting cultures (negative to date), now on meripenem.  WBC improving overnight.  2. AKI- following cardiac cath, cabg, cardiogenic shock, cardiac arrest due to tamponade, and acute on chronic CHF. Started CRRT on 08/24/19 due to anasarca and need for volume removal to close his open chest.  CRRT was held on 09/01/19 with line changes. 1. Resumed CRRT 09/02/19 and UF as tolerated. 2. Left subclavian trialysis catheter placed by PCCM on 09/01/19 3. Resume 4K/2.5Ca for all replacement fluids.   4. UF goal 0-100 ml/hr depending upon BP response 3. CAD s/p CABG x 4 complicated by  cardiac tamponade s/p reopened chest urgently on 08/18/19 with wound vac. 4. Acute systolic heart failure due to ischemic cardiomyopathy- volume markedly improved with CRRT. 5. ABLA- hgb dropped to 5.9.  S/p blood transfusion. Continue to follow 6. Pericardial tamponade- s/p urgent surgery and chest opening and now with wound vac. For chest closure per Dr. Orvan Seen 7. Postoperative acute hypoxic respiratory failure- reintubated 08/18/19 and likely aspiration. 8. Anemia- stableand transfuse prn. 9. Ileus- had BM last night but still distended and tense. Per primary.  Donetta Potts, MD [employer] [phone]

## 2019-09-04 ENCOUNTER — Inpatient Hospital Stay (HOSPITAL_COMMUNITY): Payer: Medicare Other | Admitting: Certified Registered Nurse Anesthetist

## 2019-09-04 ENCOUNTER — Inpatient Hospital Stay (HOSPITAL_COMMUNITY): Payer: Medicare Other

## 2019-09-04 ENCOUNTER — Encounter (HOSPITAL_COMMUNITY): Payer: Self-pay | Admitting: Cardiothoracic Surgery

## 2019-09-04 ENCOUNTER — Encounter (HOSPITAL_COMMUNITY)
Admission: EM | Disposition: A | Discharge status: Skilled Nursing Facility | Payer: Self-pay | Source: Home / Self Care | Attending: Cardiothoracic Surgery

## 2019-09-04 DIAGNOSIS — I9789 Other postprocedural complications and disorders of the circulatory system, not elsewhere classified: Secondary | ICD-10-CM

## 2019-09-04 HISTORY — PX: EXPLORATION POST OPERATIVE OPEN HEART: SHX5061

## 2019-09-04 HISTORY — PX: APPLICATION OF WOUND VAC: SHX5189

## 2019-09-04 LAB — RENAL FUNCTION PANEL
Albumin: 2 g/dL — ABNORMAL LOW (ref 3.5–5.0)
Albumin: 2.3 g/dL — ABNORMAL LOW (ref 3.5–5.0)
Anion gap: 11 (ref 5–15)
Anion gap: 11 (ref 5–15)
BUN: 32 mg/dL — ABNORMAL HIGH (ref 8–23)
BUN: 35 mg/dL — ABNORMAL HIGH (ref 8–23)
CO2: 25 mmol/L (ref 22–32)
CO2: 23 mmol/L (ref 22–32)
Calcium: 8 mg/dL — ABNORMAL LOW (ref 8.9–10.3)
Calcium: 8.1 mg/dL — ABNORMAL LOW (ref 8.9–10.3)
Chloride: 103 mmol/L (ref 98–111)
Chloride: 103 mmol/L (ref 98–111)
Creatinine, Ser: 2.24 mg/dL — ABNORMAL HIGH (ref 0.61–1.24)
Creatinine, Ser: 2.53 mg/dL — ABNORMAL HIGH (ref 0.61–1.24)
GFR calc Af Amer: 33 mL/min — ABNORMAL LOW (ref >60)
GFR calc Af Amer: 28 mL/min — ABNORMAL LOW (ref >60)
GFR calc non Af Amer: 28 mL/min — ABNORMAL LOW (ref >60)
GFR calc non Af Amer: 24 mL/min — ABNORMAL LOW (ref >60)
Glucose, Bld: 82 mg/dL (ref 70–99)
Glucose, Bld: 191 mg/dL — ABNORMAL HIGH (ref 70–99)
Phosphorus: 2.9 mg/dL (ref 2.5–4.6)
Phosphorus: 3.3 mg/dL (ref 2.5–4.6)
Potassium: 4.4 mmol/L (ref 3.5–5.1)
Potassium: 5 mmol/L (ref 3.5–5.1)
Sodium: 139 mmol/L (ref 135–145)
Sodium: 137 mmol/L (ref 135–145)

## 2019-09-04 LAB — GLUCOSE, CAPILLARY
Glucose-Capillary: 138 mg/dL — ABNORMAL HIGH (ref 70–99)
Glucose-Capillary: 143 mg/dL — ABNORMAL HIGH (ref 70–99)
Glucose-Capillary: 152 mg/dL — ABNORMAL HIGH (ref 70–99)
Glucose-Capillary: 163 mg/dL — ABNORMAL HIGH (ref 70–99)
Glucose-Capillary: 176 mg/dL — ABNORMAL HIGH (ref 70–99)
Glucose-Capillary: 180 mg/dL — ABNORMAL HIGH (ref 70–99)
Glucose-Capillary: 83 mg/dL (ref 70–99)
Glucose-Capillary: 84 mg/dL (ref 70–99)

## 2019-09-04 LAB — CBC
HCT: 27.4 % — ABNORMAL LOW (ref 39.0–52.0)
Hemoglobin: 8.5 g/dL — ABNORMAL LOW (ref 13.0–17.0)
MCH: 30.4 pg (ref 26.0–34.0)
MCHC: 31 g/dL (ref 30.0–36.0)
MCV: 97.9 fL (ref 80.0–100.0)
Platelets: 116 10*3/uL — ABNORMAL LOW (ref 150–400)
RBC: 2.8 MIL/uL — ABNORMAL LOW (ref 4.22–5.81)
RDW: 19.4 % — ABNORMAL HIGH (ref 11.5–15.5)
WBC: 10.9 10*3/uL — ABNORMAL HIGH (ref 4.0–10.5)
nRBC: 1.6 % — ABNORMAL HIGH (ref 0.0–0.2)

## 2019-09-04 LAB — POCT ACTIVATED CLOTTING TIME: Activated Clotting Time: 153 seconds

## 2019-09-04 LAB — APTT: aPTT: 30 seconds (ref 24–36)

## 2019-09-04 LAB — MAGNESIUM: Magnesium: 2.5 mg/dL — ABNORMAL HIGH (ref 1.7–2.4)

## 2019-09-04 SURGERY — EXPLORATION POST OPERATIVE OPEN HEART
Anesthesia: General | Site: Chest

## 2019-09-04 MED ORDER — DEXAMETHASONE SODIUM PHOSPHATE 10 MG/ML IJ SOLN
INTRAMUSCULAR | Status: DC | PRN
  Administered 2019-09-04: 5 mg via INTRAVENOUS

## 2019-09-04 MED ORDER — ONDANSETRON HCL 4 MG/2ML IJ SOLN
INTRAMUSCULAR | Status: AC
  Filled 2019-09-04: qty 2

## 2019-09-04 MED ORDER — 0.9 % SODIUM CHLORIDE (POUR BTL) OPTIME
TOPICAL | Status: DC | PRN
  Administered 2019-09-04: 2000 mL

## 2019-09-04 MED ORDER — DEXAMETHASONE SODIUM PHOSPHATE 10 MG/ML IJ SOLN
INTRAMUSCULAR | Status: AC
  Filled 2019-09-04: qty 1

## 2019-09-04 MED ORDER — CHLORHEXIDINE GLUCONATE 0.12 % MT SOLN: 15.0000 mL | Freq: Two times a day (BID) | OROMUCOSAL | Status: DC

## 2019-09-04 MED ORDER — ROCURONIUM BROMIDE 10 MG/ML (PF) SYRINGE
PREFILLED_SYRINGE | INTRAVENOUS | Status: AC
  Filled 2019-09-04: qty 10

## 2019-09-04 MED ORDER — MIDAZOLAM HCL 2 MG/2ML IJ SOLN
INTRAMUSCULAR | Status: AC
  Filled 2019-09-04: qty 2

## 2019-09-04 MED ORDER — STERILE WATER FOR IRRIGATION IR SOLN
Status: DC | PRN
  Administered 2019-09-04: 1000 mL

## 2019-09-04 MED ORDER — ORAL CARE MOUTH RINSE
15.0000 mL | OROMUCOSAL | Status: DC
  Administered 2019-09-04 – 2019-09-21 (×158): 15 mL via OROMUCOSAL

## 2019-09-04 MED ORDER — DARBEPOETIN ALFA 150 MCG/0.3ML IJ SOSY
150.0000 ug | PREFILLED_SYRINGE | INTRAMUSCULAR | Status: DC
  Administered 2019-09-04 – 2019-09-25 (×4): 150 ug via SUBCUTANEOUS
  Filled 2019-09-04 (×4): qty 0.3

## 2019-09-04 MED ORDER — SERTRALINE HCL 50 MG PO TABS
25.0000 mg | ORAL_TABLET | Freq: Every day | ORAL | Status: DC
  Administered 2019-09-04 – 2019-09-15 (×12): 25 mg via ORAL
  Filled 2019-09-04 (×12): qty 1

## 2019-09-04 MED ORDER — MIDAZOLAM HCL 2 MG/2ML IJ SOLN
INTRAMUSCULAR | Status: DC | PRN
  Administered 2019-09-04: 2 mg via INTRAVENOUS

## 2019-09-04 MED ORDER — SODIUM CHLORIDE 0.9 % IV SOLN
INTRAVENOUS | Status: DC | PRN
Start: 2019-09-04 — End: 2019-09-04

## 2019-09-04 MED ORDER — PHENYLEPHRINE 40 MCG/ML (10ML) SYRINGE FOR IV PUSH (FOR BLOOD PRESSURE SUPPORT)
PREFILLED_SYRINGE | INTRAVENOUS | Status: DC | PRN
  Administered 2019-09-04: 80 ug via INTRAVENOUS
  Administered 2019-09-04: 120 ug via INTRAVENOUS
  Administered 2019-09-04: 80 ug via INTRAVENOUS

## 2019-09-04 MED ORDER — SODIUM CHLORIDE 0.9 % IR SOLN
Status: DC | PRN
  Administered 2019-09-04: 3000 mL

## 2019-09-04 MED ORDER — SODIUM CHLORIDE 0.9 % IV SOLN
1000.0000 [IU]/h | INTRAVENOUS | Status: DC
  Administered 2019-09-04 – 2019-09-06 (×5): 1000 [IU]/h via INTRAVENOUS_CENTRAL
  Filled 2019-09-04 (×4): qty 2

## 2019-09-04 MED ORDER — FENTANYL CITRATE (PF) 250 MCG/5ML IJ SOLN
INTRAMUSCULAR | Status: AC
  Filled 2019-09-04: qty 5

## 2019-09-04 MED ORDER — ORAL CARE MOUTH RINSE: 15.0000 mL | OROMUCOSAL | Status: DC

## 2019-09-04 MED ORDER — ONDANSETRON HCL 4 MG/2ML IJ SOLN
INTRAMUSCULAR | Status: DC | PRN
  Administered 2019-09-04: 4 mg via INTRAVENOUS

## 2019-09-04 MED ORDER — ALBUMIN HUMAN 5 % IV SOLN
INTRAVENOUS | Status: DC | PRN
Start: 2019-09-04 — End: 2019-09-04

## 2019-09-04 MED ORDER — ROCURONIUM BROMIDE 10 MG/ML (PF) SYRINGE
PREFILLED_SYRINGE | INTRAVENOUS | Status: DC | PRN
  Administered 2019-09-04: 50 mg via INTRAVENOUS
  Administered 2019-09-04: 30 mg via INTRAVENOUS
  Administered 2019-09-04: 20 mg via INTRAVENOUS

## 2019-09-04 MED ORDER — CHLORHEXIDINE GLUCONATE 0.12% ORAL RINSE (MEDLINE KIT)
15.0000 mL | Freq: Two times a day (BID) | OROMUCOSAL | Status: DC
  Administered 2019-09-04 – 2019-09-21 (×33): 15 mL via OROMUCOSAL

## 2019-09-04 MED ORDER — ORAL CARE MOUTH RINSE: 15.0000 mL | Freq: Two times a day (BID) | OROMUCOSAL | Status: DC

## 2019-09-04 MED ORDER — PROPOFOL 10 MG/ML IV BOLUS
INTRAVENOUS | Status: AC
  Filled 2019-09-04: qty 20

## 2019-09-04 MED ORDER — FENTANYL CITRATE (PF) 250 MCG/5ML IJ SOLN
INTRAMUSCULAR | Status: DC | PRN
  Administered 2019-09-04: 50 ug via INTRAVENOUS

## 2019-09-04 MED ORDER — PHENYLEPHRINE HCL-NACL 10-0.9 MG/250ML-% IV SOLN
INTRAVENOUS | Status: DC | PRN
  Administered 2019-09-04: 25 ug/min via INTRAVENOUS

## 2019-09-04 SURGICAL SUPPLY — 114 items
ADAPTER CARDIO PERF ANTE/RETRO (ADAPTER) IMPLANT
APPLIER CLIP 9.375 MED OPEN (MISCELLANEOUS)
APPLIER CLIP 9.375 SM OPEN (CLIP)
BAG DECANTER FOR FLEXI CONT (MISCELLANEOUS) ×2 IMPLANT
BLADE CLIPPER SURG (BLADE) ×2 IMPLANT
BLADE SURG 11 STRL SS (BLADE) IMPLANT
CANISTER SUCT 3000ML PPV (MISCELLANEOUS) ×3 IMPLANT
CANISTER WOUND CARE 500ML ATS (WOUND CARE) ×1 IMPLANT
CANN PRFSN .5XCNCT 15X34-48 (MISCELLANEOUS)
CANNULA GUNDRY RCSP 15FR (MISCELLANEOUS) IMPLANT
CANNULA PRFSN .5XCNCT 15X34-48 (MISCELLANEOUS) ×2 IMPLANT
CANNULA VEN 2 STAGE (MISCELLANEOUS)
CATH CPB KIT GERHARDT (MISCELLANEOUS) ×2 IMPLANT
CATH THORACIC 28FR (CATHETERS) IMPLANT
CLIP APPLIE 9.375 MED OPEN (MISCELLANEOUS) IMPLANT
CLIP APPLIE 9.375 SM OPEN (CLIP) IMPLANT
CLIP FOGARTY SPRING 6M (CLIP) IMPLANT
CLIP VESOCCLUDE MED 24/CT (CLIP) IMPLANT
CLIP VESOCCLUDE SM WIDE 24/CT (CLIP) IMPLANT
CONN 3/8X3/8 GISH STERILE (MISCELLANEOUS) ×1 IMPLANT
CONN Y 3/8X3/8X3/8  BEN (MISCELLANEOUS)
CONN Y 3/8X3/8X3/8 BEN (MISCELLANEOUS) IMPLANT
COVER SURGICAL LIGHT HANDLE (MISCELLANEOUS) ×4 IMPLANT
DRAIN CHANNEL 28F RND 3/8 FF (WOUND CARE) ×2 IMPLANT
DRAPE CARDIOVASCULAR INCISE (DRAPES)
DRAPE LAPAROSCOPIC ABDOMINAL (DRAPES) ×1 IMPLANT
DRAPE SRG 135X102X78XABS (DRAPES) ×2 IMPLANT
DRESSING ALLEVYN LITE 5X5 NADH (GAUZE/BANDAGES/DRESSINGS) IMPLANT
DRESSING VERAFLO CLEANSE CC (GAUZE/BANDAGES/DRESSINGS) IMPLANT
DRSG ALLEVYN LITE 5X5 NADH (GAUZE/BANDAGES/DRESSINGS) ×3
DRSG AQUACEL AG ADV 3.5X14 (GAUZE/BANDAGES/DRESSINGS) ×2 IMPLANT
DRSG VERAFLO CLEANSE CC (GAUZE/BANDAGES/DRESSINGS) ×3
DRSG VERSA FOAM LRG 10X15 (GAUZE/BANDAGES/DRESSINGS) ×1 IMPLANT
ELECT BLADE 4.0 EZ CLEAN MEGAD (MISCELLANEOUS) ×3
ELECT CAUTERY BLADE 6.4 (BLADE) ×1 IMPLANT
ELECT REM PT RETURN 9FT ADLT (ELECTROSURGICAL) ×3
ELECTRODE BLDE 4.0 EZ CLN MEGD (MISCELLANEOUS) ×2 IMPLANT
ELECTRODE REM PT RTRN 9FT ADLT (ELECTROSURGICAL) ×4 IMPLANT
FILTER SMOKE EVAC ULPA (FILTER) ×2 IMPLANT
GAUZE SPONGE 4X4 12PLY STRL (GAUZE/BANDAGES/DRESSINGS) ×5 IMPLANT
GLOVE BIO SURGEON STRL SZ 6 (GLOVE) IMPLANT
GLOVE BIO SURGEON STRL SZ 6.5 (GLOVE) ×2 IMPLANT
GLOVE BIO SURGEON STRL SZ7 (GLOVE) IMPLANT
GLOVE BIO SURGEON STRL SZ7.5 (GLOVE) IMPLANT
GLOVE BIOGEL PI IND STRL 6.5 (GLOVE) IMPLANT
GLOVE BIOGEL PI INDICATOR 6.5 (GLOVE) ×1
GOWN STRL REUS W/ TWL LRG LVL3 (GOWN DISPOSABLE) ×8 IMPLANT
GOWN STRL REUS W/TWL LRG LVL3 (GOWN DISPOSABLE) ×9
HANDPIECE INTERPULSE COAX TIP (DISPOSABLE) ×3
HEMOSTAT POWDER SURGIFOAM 1G (HEMOSTASIS) IMPLANT
INSERT FOGARTY XLG (MISCELLANEOUS) IMPLANT
KIT BASIN OR (CUSTOM PROCEDURE TRAY) ×3 IMPLANT
KIT SUCTION CATH 14FR (SUCTIONS) IMPLANT
KIT TURNOVER KIT B (KITS) ×3 IMPLANT
KIT VASOVIEW HEMOPRO 2 VH 4000 (KITS) IMPLANT
LEAD PACING MYOCARDI (MISCELLANEOUS) ×2 IMPLANT
LINE VENT (MISCELLANEOUS) IMPLANT
MANIFOLD NEPTUNE II (INSTRUMENTS) ×1 IMPLANT
MARKER SKIN DUAL TIP RULER LAB (MISCELLANEOUS) ×1 IMPLANT
NS IRRIG 1000ML POUR BTL (IV SOLUTION) ×12 IMPLANT
PACK CHEST (CUSTOM PROCEDURE TRAY) ×3 IMPLANT
PACK E OPEN HEART (SUTURE) ×2 IMPLANT
PAD ARMBOARD 7.5X6 YLW CONV (MISCELLANEOUS) ×6 IMPLANT
PAD ELECT DEFIB RADIOL ZOLL (MISCELLANEOUS) ×3 IMPLANT
PAD NEG PRESSURE SENSATRAC (MISCELLANEOUS) ×1 IMPLANT
PENCIL SMOKE EVACUATOR (MISCELLANEOUS) ×2 IMPLANT
POSITIONER HEAD DONUT 9IN (MISCELLANEOUS) ×3 IMPLANT
SET CARDIOPLEGIA MPS 5001102 (MISCELLANEOUS) IMPLANT
SET HNDPC FAN SPRY TIP SCT (DISPOSABLE) IMPLANT
SLEEVE SUCTION 125 (MISCELLANEOUS) ×2 IMPLANT
SOL ANTI FOG 6CC (MISCELLANEOUS) IMPLANT
SOLUTION ANTI FOG 6CC (MISCELLANEOUS)
SPONGE DRAIN TRACH 4X4 STRL 2S (GAUZE/BANDAGES/DRESSINGS) ×1 IMPLANT
SPONGE LAP 18X18 RF (DISPOSABLE) ×1 IMPLANT
SPONGE LAP 4X18 RFD (DISPOSABLE) IMPLANT
STOPCOCK 4 WAY LG BORE MALE ST (IV SETS) IMPLANT
SUT ETHIBOND 2 0 SH (SUTURE)
SUT ETHIBOND 2 0 SH 36X2 (SUTURE) IMPLANT
SUT ETHILON 3 0 FSL (SUTURE) ×1 IMPLANT
SUT MNCRL AB 4-0 PS2 18 (SUTURE) IMPLANT
SUT PROLENE 3 0 SH 1 (SUTURE) IMPLANT
SUT PROLENE 4 0 RB 1 (SUTURE)
SUT PROLENE 4 0 TF (SUTURE) ×4 IMPLANT
SUT PROLENE 4-0 RB1 .5 CRCL 36 (SUTURE) IMPLANT
SUT PROLENE 5 0 C 1 36 (SUTURE) IMPLANT
SUT PROLENE 6 0 C 1 30 (SUTURE) IMPLANT
SUT PROLENE 7 0 BV 1 (SUTURE) IMPLANT
SUT PROLENE 7 0 BV1 MDA (SUTURE) IMPLANT
SUT PROLENE 8 0 BV175 6 (SUTURE) IMPLANT
SUT SILK  1 MH (SUTURE)
SUT SILK 1 MH (SUTURE) ×6 IMPLANT
SUT STEEL 6MS V (SUTURE) IMPLANT
SUT STEEL STERNAL CCS#1 18IN (SUTURE) IMPLANT
SUT STEEL SZ 6 DBL 3X14 BALL (SUTURE) IMPLANT
SUT VIC AB 1 CTX 18 (SUTURE) ×4 IMPLANT
SUT VIC AB 1 CTX 36 (SUTURE)
SUT VIC AB 1 CTX36XBRD ANBCTR (SUTURE) IMPLANT
SUT VIC AB 2-0 CT1 36 (SUTURE) IMPLANT
SUT VIC AB 2-0 CTX 27 (SUTURE) IMPLANT
SUT VIC AB 3-0 SH 27 (SUTURE)
SUT VIC AB 3-0 SH 27X BRD (SUTURE) IMPLANT
SUT VIC AB 3-0 X1 27 (SUTURE) IMPLANT
SUT VICRYL 4-0 PS2 18IN ABS (SUTURE) IMPLANT
SWAB COLLECTION DEVICE MRSA (MISCELLANEOUS) ×1 IMPLANT
SWAB CULTURE ESWAB REG 1ML (MISCELLANEOUS) ×1 IMPLANT
SYSTEM SAHARA CHEST DRAIN ATS (WOUND CARE) IMPLANT
TOWEL GREEN STERILE (TOWEL DISPOSABLE) ×3 IMPLANT
TOWEL GREEN STERILE FF (TOWEL DISPOSABLE) ×3 IMPLANT
TRAP FLUID SMOKE EVACUATOR (MISCELLANEOUS) ×2 IMPLANT
TRAY CATH LUMEN 1 20CM STRL (SET/KITS/TRAYS/PACK) IMPLANT
TUBE FEEDING 8FR 16IN STR KANG (MISCELLANEOUS) ×2 IMPLANT
TUBING LAP HI FLOW INSUFFLATIO (TUBING) IMPLANT
UNDERPAD 30X36 HEAVY ABSORB (UNDERPADS AND DIAPERS) ×2 IMPLANT
WATER STERILE IRR 1000ML POUR (IV SOLUTION) ×5 IMPLANT

## 2019-09-04 NOTE — Brief Op Note (Addendum)
      [address]       [place] [address]             [phone]    09/04/2019  9:41 AM  PATIENT:  Christian [name]  74 y.o. male  PRE-OPERATIVE DIAGNOSIS:  sternal wound  POST-OPERATIVE DIAGNOSIS:  sternal wound s/p open heart surgery  PROCEDURE:  Procedure(s): MEDIASTINAL EXPLORATION WITH STERNAL WOUND IRRIGATION (N/A) Application Of Wound Vac (N/A)  SURGEON:  Surgeon(s) and Role:    * Grace Tavien, MD - Primary   ANESTHESIA:   general  EBL:  50 mL   BLOOD ADMINISTERED:none  DRAINS: none   LOCAL MEDICATIONS USED:  NONE  SPECIMEN:  Source of Specimen:  sternal wound   DISPOSITION OF SPECIMEN:  micro  COUNTS:  YES   DICTATION: .Dragon Dictation  PLAN OF CARE: return to icu  PATIENT DISPOSITION:  ICU - intubated and critically ill.   Delay start of Pharmacological VTE agent (>24hrs) due to surgical blood loss or risk of bleeding: yes

## 2019-09-04 NOTE — Progress Notes (Signed)
PT Cancellation Note  Patient Details Name: Christian [name] MRN: [id number] DOB: [birth year]   Cancelled Treatment:    Reason Eval/Treat Not Completed: Patient at procedure or test/unavailable   Amalea Ottey B Yadira Hada 09/04/2019, 7:44 AM  [name], PT Acute Rehabilitation Services Pager: [phone] Office: [phone]

## 2019-09-04 NOTE — Progress Notes (Signed)
NAME:  Christian [name], MRN:  [id number], DOB:  Jul 17, 1945, LOS: 47 ADMISSION DATE:  08/09/2019, CONSULTATION DATE:  08/28/2019 REFERRING MD:  Orvan Seen - TRH, CHIEF COMPLAINT:  Respiratory failure.    HPI/course in hospital   74 year old man who presented with progressive dyspnea.  He was admitted 4/14 and was incidentally found to have a right lower lobe pulmonary embolism.  Overall the picture was that of decompensated heart failure with a non-STEMI.  Coronary angiography revealed triple-vessel disease and the patient was referred for cardiac surgery.  EF was found to be reduced at that time at  He underwent four-vessel CABG 4/20 with good quality distal targets.  Cardiac function remained reduced post bypass.  Unable to close chest due to surrounding edema.  Underwent attempted closure with VAC placement 4/22.   Cardiac arrest 4/23 with repeated washouts with VAC placements on 4/26 and 4/29.  Significant volume overload postoperatively.  Acute kidney injury.  Currently on CRRT for fluid removal.  08/09/19 Admitted with NSTEM and ? Small RLL PE. Weight 213 pounds.  08/15/19 CABG x4 on 08/15/19 08/18/19 Cardiac arrest due to tamponade. Chest opened for washout 4/26 Chest washout in OR 4/29 CVVH started 5/5 OR for chest washout, tracheostomy 5/10 repeat washout, unable to close   Interim history/subjective:  Went for repeat washout today. He and wife are upset that could not achieve closure.  Objective   Blood pressure 140/73, pulse 87, temperature 98.4 F (36.9 C), temperature source Oral, resp. rate (!) 21, height 5\' 9"  (1.753 m), weight 85.3 kg, SpO2 100 %.    Vent Mode: PRVC FiO2 (%):  [30 %] 30 % Set Rate:  [18 bmp] 18 bmp Vt Set:  [560 mL] 560 mL PEEP:  [5 cmH20] 5 cmH20 Plateau Pressure:  [16 cmH20-23 cmH20] 23 cmH20   Intake/Output Summary (Last 24 hours) at 09/04/2019 1157 Last data filed at 09/04/2019 1100 Gross per 24 hour  Intake 2642.49 ml  Output 2983 ml  Net  -340.51 ml   Filed Weights   09/01/19 0451 09/03/19 0421 09/04/19 0454  Weight: 98.5 kg 89 kg 85.3 kg    Examination: GEN: No acute distress lying in bed HEENT: Tracheostomy in place with small secretions CV: Heart sounds regular, extremities warm PULM: harsh rhonci today, good TV on PS GI: Protuberant, soft, positive bowel sounds, brown stool in rectal bag EXT: He has diffuse mild anasarca, left upper extremity vein graft site healing well NEURO: Moves all 4 extremities to command, globally weak PSYCH: RASS 0 SKIN: No rashes, sternum remains open  Trach aspirate 5/6 with pan sensitive pseudomonas Blood cx neg Echo 4/24: EF 45% with Grade 2 DD.   L [place] temporary HD line R midline PIV Mediastinal drain Rectal tube NGT Wound vac over sternum  Assessment & Plan:  Postoperative persistent respiratory failure after CABG and multiple mediastinal washouts, inability to close chest.  S/P trach. - PS trials as able during day - Pull off fluid as able with CRRT - Full vent at night  Pseudomonas tracheiitis vs. Pneumonia - Consider narrowing antibiotics, per primary  Acute blood loss anemia- unclear origin - better after transfusion  RLL pulmonary embolism noted mid April, afib - Needs to be back on [facility] at some point, per primary - Continue amiodarone  Deconditioning related to prolonged ICU stay - Progressive mobility as able  Acute tubular necrosis now HD dependent, generalized anasarca, inability to close chest - CRRT with negative fluid as able  Vasoplegia  vs. Cardiac stunning postop - Continue midodrine and pressors as needed to maintain MAP > 65  Hyperglycemia- better continue current regimen  Best practice:  Diet: TF Pain/Anxiety/Delirium protocol (if indicated): PRNs written for VAP protocol (if indicated): in place DVT prophylaxis: SCDs GI prophylaxis: PPI Glucose control: SSI, levemir Mobility: BR Code Status: full Family Communication: per primary  Disposition: ICU  Not much I am adding to care, will sign off, please re-engage if needed.  Erskine Emery MD [facility] 09/04/2019 11:57 AM Personal pager: [phone] If unanswered, please page CCM On-call: [phone]

## 2019-09-04 NOTE — Progress Notes (Addendum)
Patient ID: Christian [name], male   DOB: 21-Jan-1946, 74 y.o.   MRN: [id number]     Advanced Heart Failure Rounding Note  PCP-Cardiologist: No primary care provider on file.   Subjective:    08/09/19 Admitted with NSTEM and ? Small RLL PE. Weight 213 pounds.  08/15/19 CABG x4 on 08/15/19 08/18/19 Cardiac arrest due to tamponade. Chest opened for washout 4/26 Chest washout in OR 4/29 CVVH started 5/5 OR for chest washout, tracheostomy 5/7 HD catheter replaced 09/04/19 OR for washout/dressing change.   Remains on vent through trach.    Trach aspirate + rare pseudomonas  WBC 21k -> 16->10.9 . Hgb down to 6.4 -> 8.5 fter RBCs. Heparin off except for CRT  Awake.   Echo 4/25 post-pericardial evacuation: EF 40-45% with some residual pericardial clot.  Objective:   Weight Range: 85.3 kg Body mass index is 27.77 kg/m.   Vital Signs:   Temp:  [98 F (36.7 C)-99.1 F (37.3 C)] 98.7 F (37.1 C) (05/10 0715) Pulse Rate:  [84-103] 95 (05/10 0700) Resp:  [18-32] 18 (05/10 0700) BP: (103-152)/(55-83) 109/68 (05/10 0700) SpO2:  [90 %-100 %] 100 % (05/10 0700) FiO2 (%):  [30 %] 30 % (05/10 0715) Weight:  [85.3 kg] 85.3 kg (05/10 0454) Last BM Date: 09/03/19  Weight change: Filed Weights   09/01/19 0451 09/03/19 0421 09/04/19 0454  Weight: 98.5 kg 89 kg 85.3 kg    Intake/Output:   Intake/Output Summary (Last 24 hours) at 09/04/2019 0945 Last data filed at 09/04/2019 0900 Gross per 24 hour  Intake 2742.49 ml  Output 3141 ml  Net -398.51 ml      Physical Exam  General:  Awake on vent via trach.  HEENT: normal + dortrac Neck: Trach, supple. no JVD. Carotids 2+ bilat; no bruits. No lymphadenopathy or thryomegaly appreciated. Cor: PMI nondisplaced. Regular rate & rhythm. No rubs, gallops or murmurs. Sternal VAC Lungs: Coarse throughout Abdomen: soft, nontender, nondistended. No hepatosplenomegaly. No bruits or masses. Good bowel sounds. Extremities: no cyanosis, clubbing, rash,  edema Neuro: Awake on vent    Telemetry   NSR 90s   Labs    CBC Recent Labs    09/02/19 0311 09/02/19 0945 09/03/19 0337 09/04/19 0358  WBC 21.6*  --  16.5* 10.9*  NEUTROABS 16.8*  --   --   --   HGB 6.4*   < > 8.8* 8.5*  HCT 20.0*   < > 27.5* 27.4*  MCV 96.2  --  95.2 97.9  PLT 184  --  176 116*   < > = values in this interval not displayed.   Basic Metabolic Panel Recent Labs    09/03/19 0337 09/03/19 0337 09/03/19 1530 09/04/19 0358  NA 140   < > 139 139  K 4.4   < > 4.2 4.4  CL 104   < > 104 103  CO2 24   < > 25 25  GLUCOSE 167*   < > 135* 82  BUN 53*   < > 40* 32*  CREATININE 2.87*   < > 2.47* 2.24*  CALCIUM 8.4*   < > 8.0* 8.0*  MG 2.6*  --   --  2.5*  PHOS 2.3*   < > 2.3* 2.9   < > = values in this interval not displayed.   Liver Function Tests Recent Labs    09/02/19 0311 09/02/19 1530 09/03/19 1530 09/04/19 0358  AST 38  --   --   --   ALT  49*  --   --   --   ALKPHOS 104  --   --   --   BILITOT 1.0  --   --   --   PROT 5.1*  --   --   --   ALBUMIN 2.1*   < > 2.1* 2.0*   < > = values in this interval not displayed.   No results for input(s): LIPASE, AMYLASE in the last 72 hours. Cardiac Enzymes No results for input(s): CKTOTAL, CKMB, CKMBINDEX, TROPONINI in the last 72 hours.  BNP: BNP (last 3 results) Recent Labs    08/09/19 1240 09/02/19 0311  BNP 89.1 761.0*    ProBNP (last 3 results) Recent Labs    11/23/18 1120 08/09/19 0922  PROBNP 18.0 78.0     D-Dimer No results for input(s): DDIMER in the last 72 hours. Hemoglobin A1C No results for input(s): HGBA1C in the last 72 hours. Fasting Lipid Panel No results for input(s): CHOL, HDL, LDLCALC, TRIG, CHOLHDL, LDLDIRECT in the last 72 hours. Thyroid Function Tests No results for input(s): TSH, T4TOTAL, T3FREE, THYROIDAB in the last 72 hours.  Invalid input(s): FREET3  Other results:   Imaging    DG Chest Port 1 View  Result Date: 09/04/2019 CLINICAL DATA:  Chest  tube EXAM: PORTABLE CHEST 1 VIEW COMPARISON:  09/03/2019 FINDINGS: Support devices including right chest tube remain in place, unchanged. No pneumothorax. Left lower lobe airspace opacity and nodular density in the right mid lung are unchanged. Heart is borderline in size. No visible effusions or acute bony abnormality. IMPRESSION: No significant change since prior study. Electronically Signed   By: Rolm Baptise M.D.   On: 09/04/2019 08:12     Medications:     Scheduled Medications: . sodium chloride   Intravenous Once  . sodium chloride   Intravenous Once  . amiodarone  200 mg Per Tube BID  . aspirin  81 mg Per Tube Daily  . atorvastatin  40 mg Per Tube Daily  . B-complex with vitamin C  1 tablet Per Tube Daily  . bisacodyl  10 mg Oral Daily   Or  . bisacodyl  10 mg Rectal Daily  . chlorhexidine gluconate (MEDLINE KIT)  15 mL Mouth Rinse BID  . Chlorhexidine Gluconate Cloth  6 each Topical Daily  . clopidogrel  75 mg Per Tube Daily  . docusate  100 mg Per Tube BID  . feeding supplement (PRO-STAT SUGAR FREE 64)  60 mL Per Tube TID  . insulin aspart  0-24 Units Subcutaneous Q4H  . insulin detemir  10 Units Subcutaneous BID  . mouth rinse  15 mL Mouth Rinse 10 times per day  . midodrine  10 mg Oral TID WC  . pantoprazole sodium  40 mg Per Tube Daily  . sodium chloride flush  10-40 mL Intracatheter Q12H  . sodium chloride flush  10-40 mL Intracatheter Q12H  . trospium  20 mg Per Tube BID    Infusions: .  prismasol BGK 4/2.5 500 mL/hr at 09/04/19 0330  .  prismasol BGK 4/2.5 300 mL/hr at 09/04/19 0330  . sodium chloride Stopped (09/01/19 1008)  . feeding supplement (VITAL 1.5 CAL) Stopped (09/04/19 0000)  . fluconazole (DIFLUCAN) IV Stopped (09/03/19 2344)  . heparin 10,000 units/ 20 mL infusion syringe Stopped (09/04/19 0540)  . lactated ringers 20 mL/hr at 08/20/19 1200  . magnesium sulfate bolus IVPB    . meropenem (MERREM) IV 0 mL/hr at 09/04/19 0037  . norepinephrine  (  LEVOPHED) Adult infusion Stopped (09/02/19 0647)  . prismasol BGK 4/2.5 1,500 mL/hr at 09/04/19 0430  . vancomycin Stopped (09/03/19 1849)  . vasopressin (PITRESSIN) infusion - *FOR SHOCK* Stopped (09/02/19 1634)    PRN Medications: ALPRAZolam, cromolyn, dextrose, heparin, heparin, hydrALAZINE, HYDROmorphone (DILAUDID) injection, ondansetron (ZOFRAN) IV, oxyCODONE, sodium chloride flush, sodium chloride flush     Assessment/Plan   1. CAD: s/p PDA angioplasty + stent in 02/2014.  Admitted for NSTEMI 4/14. Hs Trop peaked at 3,309.  LHC w/ severe 3V CAD. Echo w/ reduced EF 30-35%. RV ok.  S/p CABG x 4 on 4/20 (LIMA-LAD, RIMA to PDA, Lt radial artery graft to 1st OM and distal OM branches of LCx).  Chest reopened urgently at the bedside for cardiac tamponade and removal of clot on 4/23  - continue ASA/Plavix.  Will need to stop one of the antiplatelet agents ultimately as he will be on anticoagulation at home.  - Atorvastatin 40 daily.   - No s/s angina - No change 2. Acute Systolic Heart Failure: Ischemic Cardiomyopathy -> cardiogenic shock.  LVEF severely reduced at 30-35%, in the setting of NSTEM/ Multivessel CAD, RV ok initially.  Underwent urgent sternotomy at the bedside for cardiac tamponade and removal of clot on 4/23. Chest remains open. Luiz Blare numbers have been concerning for RV failure (swan now out).  Echo 4/24 EF 40 to 45% with inotropic support, small amount of residual pericardial clot, RV moderately hypokinetic.  On 5/7  Concern for developing septic shock with rising WBCs. Volume status per HD. Off all pressors.  3.  Postoperative acute hypoxic respiratory failure: Reintubated on 4/23 due to cardiogenic shock and return to OR. Now with tracheostomy, FiO2 0.3.  -  Full vent at night,  4. AKI: Baseline SCr 0.9. Creatinine peaked to 5.3, suspect ATN in setting of cardiogenic shock/tamponade.  - Restarting CVHD today.  5. Shock liver: Due to cardiac arrest on 4/23.  LFTs trending  down.  6. 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 Maintaining NSR with PACs.   - Continue amiodarone per tube. - Off heparin due to bleeding. Consider NOAC prior to d/c  7.  Possible small right lower lobe PE: Chest CT 4/14 w/ small RLL PE.  Bilateral Venous Dopplers negative for DVT.  - Off heparin due to bleeding. Consider NOAC prior to d/c  8. T2DM: Hgb A1c 7.7 - Remains on insulin 9. Pericardial tamponade: 4/23 return to OR for clot removal, chest wall left open. Patient had been on heparin gtt for DCCV of atrial flutter.  -Returned to the OR today. Chest remains open.   10. ID: Has been on vancomycin/cefepime with open chest. Cefepime switched to meropenem on 5/7 WBCs rising. Concern for infection/developing sepsis.  Blood cultures negative so far. Trach aspirate + rare pseudomonas => Pseudomonas tracheitis versus PNA. PCT 4.95. HD catheter changed 5/7.  - Continue vancomycin/meropenem.  - Cx negative.  13. Post-op anemia - hgb 8.5   - heparin off  Length of Stay: [place] NP-C  9:46 AM  Patient seen with NP, agree with the above note.   To OR today for washout, unable to close chest.    Remains on CVVH, pulling 0-100 net negative.  MAP stable, remains in NSR.  No access for CVP.   General: NAD, trach Neck: JVP difficult, no thyromegaly or thyroid nodule.  Lungs: Clear to auscultation bilaterally with normal respiratory effort. CV: Nondisplaced PMI.  Heart regular S1/S2, no S3/S4, no murmur.  No  peripheral edema.   Abdomen: Soft, nontender, no hepatosplenomegaly, no distention.  Skin: Intact without lesions or rashes.  Neurologic: Awake/alert Extremities: No clubbing or cyanosis.  HEENT: Normal.   He remains on vancomycin/meropenem for Pseudomonas tracheitis/PNA.  ?Narrow to meropenem, per CCM.   CVVH continues, would probably keep this going until surgery Wednesday.   Discussed with Dr. Servando Snare, plan to OR Wednesday for muscle flap to open chest.    Holding heparin gtt until after OR.   CRITICAL CARE Performed by: Loralie Champagne  Total critical care time: 35 minutes  Critical care time was exclusive of separately billable procedures and treating other patients.  Critical care was necessary to treat or prevent imminent or life-threatening deterioration.  Critical care was time spent personally by me on the following activities: development of treatment plan with patient and/or surrogate as well as nursing, discussions with consultants, evaluation of patient's response to treatment, examination of patient, obtaining history from patient or surrogate, ordering and performing treatments and interventions, ordering and review of laboratory studies, ordering and review of radiographic studies, pulse oximetry and re-evaluation of patient's condition.  Loralie Champagne 09/04/2019 2:39 PM

## 2019-09-04 NOTE — Progress Notes (Signed)
EVENING ROUNDS NOTE :     [address]       [place] [address]             [phone]                 Day of Surgery Procedure(s) (LRB): MEDIASTINAL EXPLORATION WITH STERNAL WOUND IRRIGATION (N/A) Application Of Wound Vac (N/A)   Total Length of Stay:  LOS: 26 days  Events:  No events     BP 119/66   Pulse 82   Temp 98.7 F (37.1 C) (Oral)   Resp 18   Ht 5\' 9"  (1.753 m)   Wt 85.3 kg   SpO2 100%   BMI 27.77 kg/m      Vent Mode: PRVC FiO2 (%):  [30 %] 30 % Set Rate:  [18 bmp] 18 bmp Vt Set:  [560 mL] 560 mL PEEP:  [5 cmH20] 5 cmH20 Plateau Pressure:  [16 cmH20-23 cmH20] 21 cmH20  .  prismasol BGK 4/2.5 500 mL/hr at 09/04/19 1030  .  prismasol BGK 4/2.5 300 mL/hr at 09/04/19 1030  . sodium chloride Stopped (09/01/19 1008)  . feeding supplement (VITAL 1.5 CAL) 1,000 mL (09/04/19 1130)  . fluconazole (DIFLUCAN) IV Stopped (09/03/19 2344)  . heparin 10,000 units/ 20 mL infusion syringe 1,000 Units/hr (09/04/19 1543)  . lactated ringers 20 mL/hr at 08/20/19 1200  . magnesium sulfate bolus IVPB    . meropenem (MERREM) IV Stopped (09/04/19 1407)  . norepinephrine (LEVOPHED) Adult infusion Stopped (09/02/19 0647)  . prismasol BGK 4/2.5 1,500 mL/hr at 09/04/19 1030  . vancomycin Stopped (09/03/19 1849)  . vasopressin (PITRESSIN) infusion - *FOR SHOCK* Stopped (09/02/19 1634)    I/O last 3 completed shifts: In: 3073.6 [Other:150; NG/GT:1450; IV Piggyback:1473.6] Out: 5000 [Drains:240; ZPHXT:0569; Stool:375; Chest Tube:60]   CBC Latest Ref Rng & Units 09/04/2019 09/03/2019 09/02/2019  WBC 4.0 - 10.5 K/uL 10.9(H) 16.5(H) -  Hemoglobin 13.0 - 17.0 g/dL 8.5(L) 8.8(L) 8.4(L)  Hematocrit 39.0 - 52.0 % 27.4(L) 27.5(L) 26.8(L)  Platelets 150 - 400 K/uL 116(L) 176 -    BMP Latest Ref Rng & Units 09/04/2019 09/03/2019 09/03/2019  Glucose 70 - 99 mg/dL 82 135(H) 167(H)  BUN 8 - 23 mg/dL 32(H) 40(H) 53(H)  Creatinine 0.61 - 1.24 mg/dL 2.24(H) 2.47(H) 2.87(H)    BUN/Creat Ratio 10 - 24 - - -  Sodium 135 - 145 mmol/L 139 139 140  Potassium 3.5 - 5.1 mmol/L 4.4 4.2 4.4  Chloride 98 - 111 mmol/L 103 104 104  CO2 22 - 32 mmol/L 25 25 24   Calcium 8.9 - 10.3 mg/dL 8.0(L) 8.0(L) 8.4(L)    ABG    Component Value Date/Time   PHART 7.513 (H) 08/20/2019 0404   PCO2ART 34.7 08/20/2019 0404   PO2ART 216 (H) 08/20/2019 0404   HCO3 27.9 08/20/2019 0404   TCO2 29 08/20/2019 0404   ACIDBASEDEF 5.0 (H) 08/18/2019 2341   O2SAT 55.6 09/01/2019 [place], MD 09/04/2019 4:23 PM

## 2019-09-04 NOTE — Transfer of Care (Signed)
Immediate Anesthesia Transfer of Care Note  Patient: Christian [name]  Procedure(s) Performed: MEDIASTINAL EXPLORATION WITH STERNAL WOUND IRRIGATION (N/A ) Application Of Wound Vac (N/A Chest)  Patient Location: SICU  Anesthesia Type:General  Level of Consciousness: drowsy and patient cooperative  Airway & Oxygen Therapy: Patient remains intubated per anesthesia plan and Patient placed on Ventilator (see vital sign flow sheet for setting)  Post-op Assessment: Report given to RN and Post -op Vital signs reviewed and stable  Post vital signs: Reviewed and stable  Last Vitals:  Vitals Value Taken Time  BP 133/72 (90)   Temp    Pulse 83   Resp 18   SpO2 100     Last Pain:  Vitals:   09/04/19 0715  TempSrc: Oral  PainSc: 0-No pain      Patients Stated Pain Goal: 2 (02/19/84 2778)  Complications: No apparent anesthesia complications

## 2019-09-04 NOTE — Progress Notes (Signed)
Pre Procedure note for inpatients:   Christian [name] has been scheduled for Procedure(s): CHEST WOUND WASHOUT POST OPERATIVE OPEN HEART (N/A) Tracheostomy Wound Vac change today. The various methods of treatment have been discussed with the patient. After consideration of the risks, benefits and treatment options the patient has consented to the planned procedure.   The patient has been seen and labs reviewed. There are no changes in the patient's condition to prevent proceeding with the planned procedure today.  Recent labs:  Lab Results  Component Value Date   WBC 10.9 (H) 09/04/2019   HGB 8.5 (L) 09/04/2019   HCT 27.4 (L) 09/04/2019   PLT 116 (L) 09/04/2019   GLUCOSE 82 09/04/2019   CHOL 107 08/11/2019   TRIG 71 08/27/2019   HDL 42 08/11/2019   LDLDIRECT 40.0 11/23/2018   LDLCALC 40 08/11/2019   ALT 49 (H) 09/02/2019   AST 38 09/02/2019   NA 139 09/04/2019   K 4.4 09/04/2019   CL 103 09/04/2019   CREATININE 2.24 (H) 09/04/2019   BUN 32 (H) 09/04/2019   CO2 25 09/04/2019   TSH 2.590 04/19/2019   INR 1.3 (H) 08/19/2019   HGBA1C 7.7 (H) 08/14/2019    Grace Jakob, MD 09/04/2019 7:21 AM

## 2019-09-04 NOTE — Anesthesia Postprocedure Evaluation (Addendum)
Anesthesia Post Note  Patient: Christian [name]  Procedure(s) Performed: MEDIASTINAL EXPLORATION WITH STERNAL WOUND IRRIGATION (N/A ) Application Of Wound Vac (N/A Chest)     Patient location during evaluation: SICU Anesthesia Type: General Pain management: pain level controlled Vital Signs Assessment: post-procedure vital signs reviewed and stable Respiratory status: patient connected to tracheostomy mask oxygen Cardiovascular status: stable Postop Assessment: no apparent nausea or vomiting Anesthetic complications: no    Last Vitals:  Vitals:   09/04/19 1430 09/04/19 1500  BP: 128/63 118/62  Pulse: 86 96  Resp: 11 (!) 21  Temp:    SpO2: 100% 100%    Last Pain:  Vitals:   09/04/19 1200  TempSrc:   PainSc: [place] Niala Stcharles

## 2019-09-04 NOTE — Progress Notes (Signed)
Patient ID: Christian [name], male   DOB: [birth year], 74 y.o.   MRN: [id number] S: back to OR for vac change this AM- removed 1200 with CRRT-  Is alert- no pressors   O:BP 133/70   Pulse 88   Temp 98.7 F (37.1 C) (Oral)   Resp 20   Ht 5' 9"  (1.753 m)   Wt 85.3 kg   SpO2 100%   BMI 27.77 kg/m   Intake/Output Summary (Last 24 hours) at 09/04/2019 1115 Last data filed at 09/04/2019 1100 Gross per 24 hour  Intake 2642.49 ml  Output 2983 ml  Net -340.51 ml   Intake/Output: I/O last 3 completed shifts: In: 3073.6 [Other:150; NG/GT:1450; IV Piggyback:1473.6] Out: 5000 [Drains:240; YIRSW:5462; Stool:375; Chest Tube:60]  Intake/Output this shift:  Total I/O In: 1150 [I.V.:800; IV Piggyback:350] Out: 90 [Blood:50; Chest Tube:40] Weight change: -3.7 kg Gen: on vent via trach left subclavian HD cath placed 5/7 CVS: RRR, no rub, open chest wound with wound vac in place Resp: cta Abd: distended, +BS, soft, NT Ext: 1+ edema  Recent Labs  Lab 08/31/19 0353 08/31/19 0353 08/31/19 1729 08/31/19 1729 09/01/19 0349 09/02/19 0123 09/02/19 0311 09/02/19 1530 09/03/19 0337 09/03/19 1530 09/04/19 0358  NA 135   < > 138   < > 138 139 137 139 140 139 139  K 4.0   < > 4.1   < > 4.3 4.2 3.9 4.4 4.4 4.2 4.4  CL 104   < > 101   < > 101 106 104 105 104 104 103  CO2 23   < > 24   < > 22 20* 20* 22 24 25 25   GLUCOSE 138*   < > 206*   < > 209* 244* 238* 138* 167* 135* 82  BUN 47*   < > 50*   < > 56* 86* 87* 75* 53* 40* 32*  CREATININE 2.35*   < > 2.35*   < > 2.57* 4.06* 4.33* 3.94* 2.87* 2.47* 2.24*  ALBUMIN 2.3*   < > 2.2*  --  2.2*  --  2.1* 2.4* 2.3* 2.1* 2.0*  CALCIUM 8.3*   < > 8.3*   < > 8.3* 8.1* 8.2* 8.4* 8.4* 8.0* 8.0*  PHOS 3.0  --  2.3*  --  3.1  --   --  2.9 2.3* 2.3* 2.9  AST  --   --   --   --   --   --  38  --   --   --   --   ALT  --   --   --   --   --   --  49*  --   --   --   --    < > = values in this interval not displayed.   Liver Function Tests: Recent Labs  Lab  09/02/19 0311 09/02/19 1530 09/03/19 0337 09/03/19 1530 09/04/19 0358  AST 38  --   --   --   --   ALT 49*  --   --   --   --   ALKPHOS 104  --   --   --   --   BILITOT 1.0  --   --   --   --   PROT 5.1*  --   --   --   --   ALBUMIN 2.1*   < > 2.3* 2.1* 2.0*   < > = values in this interval not displayed.   No results for  input(s): LIPASE, AMYLASE in the last 168 hours. No results for input(s): AMMONIA in the last 168 hours. CBC: Recent Labs  Lab 08/31/19 0353 08/31/19 0353 09/01/19 0349 09/01/19 1758 09/02/19 0311 09/02/19 0945 09/02/19 2308 09/03/19 0337 09/04/19 0358  WBC 27.3*   < > 33.6*  --  21.6*  --   --  16.5* 10.9*  NEUTROABS 22.0*  --  25.7*  --  16.8*  --   --   --   --   HGB 7.9*   < > 7.4*   < > 6.4*   < > 8.4* 8.8* 8.5*  HCT 25.0*   < > 23.9*   < > 20.0*   < > 26.8* 27.5* 27.4*  MCV 96.9  --  98.0  --  96.2  --   --  95.2 97.9  PLT 298   < > 265  --  184  --   --  176 116*   < > = values in this interval not displayed.   Cardiac Enzymes: No results for input(s): CKTOTAL, CKMB, CKMBINDEX, TROPONINI in the last 168 hours. CBG: Recent Labs  Lab 09/03/19 1615 09/03/19 1942 09/04/19 0016 09/04/19 0358 09/04/19 0532  GLUCAP 126* 159* 176* 83 84    Iron Studies: No results for input(s): IRON, TIBC, TRANSFERRIN, FERRITIN in the last 72 hours. Studies/Results: DG Chest Port 1 View  Result Date: 09/04/2019 CLINICAL DATA:  Chest tube EXAM: PORTABLE CHEST 1 VIEW COMPARISON:  09/03/2019 FINDINGS: Support devices including right chest tube remain in place, unchanged. No pneumothorax. Left lower lobe airspace opacity and nodular density in the right mid lung are unchanged. Heart is borderline in size. No visible effusions or acute bony abnormality. IMPRESSION: No significant change since prior study. Electronically Signed   By: Rolm Baptise M.D.   On: 09/04/2019 08:12   DG CHEST PORT 1 VIEW  Result Date: 09/03/2019 CLINICAL DATA:  Respiratory failure. EXAM:  PORTABLE CHEST 1 VIEW COMPARISON:  Sep 02, 2019 FINDINGS: Tracheostomy catheter tip is 6.9 cm above the carina. Enteric tube tip is below the diaphragm. Central catheter tip is at the junction of the left innominate vein and superior vena cava. There is a right chest tube and a mediastinal drain. No pneumothorax appreciable. There is persistent consolidation in the left lower lobe with left pleural effusion. Right lung is clear except for pulmonary nodular lesion noted in the periphery of the right mid lung. Heart is enlarged with pulmonary vascularity normal. Status post coronary artery bypass grafting. No adenopathy. No bone lesions. IMPRESSION: Tube and catheter positions as described without pneumothorax. Persistent airspace consolidation left lower lobe, likely due to pneumonia or potential aspiration with small pleural effusion on the left. Stable nodular opacity right mid lung. No new opacity evident. Stable cardiac silhouette. Electronically Signed   By: Lowella Grip III M.D.   On: 09/03/2019 07:54   . sodium chloride   Intravenous Once  . sodium chloride   Intravenous Once  . amiodarone  200 mg Per Tube BID  . aspirin  81 mg Per Tube Daily  . atorvastatin  40 mg Per Tube Daily  . B-complex with vitamin C  1 tablet Per Tube Daily  . bisacodyl  10 mg Oral Daily   Or  . bisacodyl  10 mg Rectal Daily  . chlorhexidine gluconate (MEDLINE KIT)  15 mL Mouth Rinse BID  . Chlorhexidine Gluconate Cloth  6 each Topical Daily  . clopidogrel  75 mg Per Tube Daily  .  docusate  100 mg Per Tube BID  . feeding supplement (PRO-STAT SUGAR FREE 64)  60 mL Per Tube TID  . insulin aspart  0-24 Units Subcutaneous Q4H  . insulin detemir  10 Units Subcutaneous BID  . mouth rinse  15 mL Mouth Rinse 10 times per day  . midodrine  10 mg Oral TID WC  . pantoprazole sodium  40 mg Per Tube Daily  . sodium chloride flush  10-40 mL Intracatheter Q12H  . sodium chloride flush  10-40 mL Intracatheter Q12H  .  trospium  20 mg Per Tube BID    BMET    Component Value Date/Time   NA 139 09/04/2019 0358   NA 141 06/19/2019 1130   K 4.4 09/04/2019 0358   CL 103 09/04/2019 0358   CO2 25 09/04/2019 0358   GLUCOSE 82 09/04/2019 0358   BUN 32 (H) 09/04/2019 0358   BUN 21 06/19/2019 1130   CREATININE 2.24 (H) 09/04/2019 0358   CALCIUM 8.0 (L) 09/04/2019 0358   GFRNONAA 28 (L) 09/04/2019 0358   GFRAA 33 (L) 09/04/2019 0358   CBC    Component Value Date/Time   WBC 10.9 (H) 09/04/2019 0358   RBC 2.80 (L) 09/04/2019 0358   HGB 8.5 (L) 09/04/2019 0358   HCT 27.4 (L) 09/04/2019 0358   PLT 116 (L) 09/04/2019 0358   MCV 97.9 09/04/2019 0358   MCH 30.4 09/04/2019 0358   MCHC 31.0 09/04/2019 0358   RDW 19.4 (H) 09/04/2019 0358   LYMPHSABS 1.1 09/02/2019 0311   MONOABS 1.5 (H) 09/02/2019 0311   EOSABS 0.3 09/02/2019 0311   BASOSABS 0.1 09/02/2019 0311     Assessment/Plan:  1. Shock- improved now on meripenem and vanc.   2. AKI- following cardiac cath, cabg, cardiogenic shock, cardiac arrest due to tamponade, and acute on chronic CHF. Started CRRT on 08/24/19 due to anasarca and need for volume removal to close his open chest.  CRRT was held on 09/01/19 with line changes. 1. Resumed CRRT 09/02/19 and UF as tolerated. 2. Left subclavian trialysis catheter placed by PCCM on 09/01/19 3. Resume 4K/2.5Ca for all replacement fluids.  no heparin  4. UF goal 0-100 ml/hr depending upon BP response 5. With open chest feel like we need to continue CRRT at this time 3. CAD s/p CABG x 4 complicated by cardiac tamponade s/p reopened chest urgently on 08/18/19 with wound vac. 4. Acute systolic heart failure due to ischemic cardiomyopathy- volume markedly improved with CRRT.  5. Pericardial tamponade- s/p urgent surgery and chest opening and now with wound vac. For chest closure per Dr. Orvan Seen 6. Postoperative acute hypoxic respiratory failure- reintubated 08/18/19 and likely aspiration. Now trach 7. Anemia-   transfuse prn.added ESA 8. Ileus-  still distended and tense. Per primary.  Louis Meckel  [employer] [phone]

## 2019-09-04 NOTE — Anesthesia Preprocedure Evaluation (Addendum)
Anesthesia Evaluation  Patient identified by MRN, date of birth, ID band Patient awake    Reviewed: Allergy & Precautions, NPO status , Patient's Chart, lab work & pertinent test results  Airway Mallampati: [place] no notable dental hx.    Pulmonary former smoker,    Pulmonary exam normal        Cardiovascular hypertension, Pt. on home beta blockers and Pt. on medications + CAD, + Cardiac Stents and + CABG   Rhythm:Regular Rate:Normal     Neuro/Psych negative neurological ROS  negative psych ROS   GI/Hepatic Neg liver ROS, hiatal hernia,   Endo/Other  diabetes, Type 2, Oral Hypoglycemic Agents  Renal/GU Renal InsufficiencyRenal disease     Musculoskeletal   Abdominal Normal abdominal exam  (+)   Peds  Hematology   Anesthesia Other Findings   Reproductive/Obstetrics                           Anesthesia Physical Anesthesia Plan  ASA: IV  Anesthesia Plan: General   Post-op Pain Management:    Induction: Inhalational  PONV Risk Score and Plan: 3 and Ondansetron and Treatment may vary due to age or medical condition  Airway Management Planned: Tracheostomy  Additional Equipment: None  Intra-op Plan:   Post-operative Plan:   Informed Consent: I have reviewed the patients History and Physical, chart, labs and discussed the procedure including the risks, benefits and alternatives for the proposed anesthesia with the patient or authorized representative who has indicated his/her understanding and acceptance.       Plan Discussed with: CRNA  Anesthesia Plan Comments: (Echo:  1. There prominent respiratory displacement of the interventricular  septum, consistent with enhanced ventricular interdependence, likely due  to constrictive physiology.. Left ventricular ejection fraction, by  estimation, is 45 to 50%. The left ventricle  has mildly decreased function. The  left ventricle demonstrates global  hypokinesis. Left ventricular diastolic parameters are consistent with  Grade II diastolic dysfunction (pseudonormalization). Elevated left atrial  pressure.  2. Right ventricular systolic function was not well visualized. The right  ventricular size is normal. Tricuspid regurgitation signal is inadequate  for assessing PA pressure.  3. No pericardial fluid is see, but the pericardium is very thick and  thrombus in the pericardial space cannot be ecluded.  4. The mitral valve is normal in structure. No evidence of mitral valve  regurgitation.  5. The aortic valve is grossly normal. Aortic valve regurgitation is not  visualized. Mild aortic valve sclerosis is present, with no evidence of  aortic valve stenosis. )       Anesthesia Quick Evaluation

## 2019-09-05 ENCOUNTER — Encounter: Payer: Self-pay | Admitting: *Deleted

## 2019-09-05 DIAGNOSIS — I5043 Acute on chronic combined systolic (congestive) and diastolic (congestive) heart failure: Secondary | ICD-10-CM

## 2019-09-05 LAB — GLUCOSE, CAPILLARY
Glucose-Capillary: 137 mg/dL — ABNORMAL HIGH (ref 70–99)
Glucose-Capillary: 146 mg/dL — ABNORMAL HIGH (ref 70–99)
Glucose-Capillary: 147 mg/dL — ABNORMAL HIGH (ref 70–99)
Glucose-Capillary: 162 mg/dL — ABNORMAL HIGH (ref 70–99)
Glucose-Capillary: 163 mg/dL — ABNORMAL HIGH (ref 70–99)
Glucose-Capillary: 183 mg/dL — ABNORMAL HIGH (ref 70–99)

## 2019-09-05 LAB — RENAL FUNCTION PANEL
Albumin: 2.2 g/dL — ABNORMAL LOW (ref 3.5–5.0)
Albumin: 2.4 g/dL — ABNORMAL LOW (ref 3.5–5.0)
Anion gap: 9 (ref 5–15)
Anion gap: 11 (ref 5–15)
BUN: 34 mg/dL — ABNORMAL HIGH (ref 8–23)
BUN: 30 mg/dL — ABNORMAL HIGH (ref 8–23)
CO2: 25 mmol/L (ref 22–32)
CO2: 26 mmol/L (ref 22–32)
Calcium: 7.9 mg/dL — ABNORMAL LOW (ref 8.9–10.3)
Calcium: 8.4 mg/dL — ABNORMAL LOW (ref 8.9–10.3)
Chloride: 105 mmol/L (ref 98–111)
Chloride: 102 mmol/L (ref 98–111)
Creatinine, Ser: 2.12 mg/dL — ABNORMAL HIGH (ref 0.61–1.24)
Creatinine, Ser: 1.94 mg/dL — ABNORMAL HIGH (ref 0.61–1.24)
GFR calc Af Amer: 35 mL/min — ABNORMAL LOW (ref >60)
GFR calc Af Amer: 39 mL/min — ABNORMAL LOW (ref >60)
GFR calc non Af Amer: 30 mL/min — ABNORMAL LOW (ref >60)
GFR calc non Af Amer: 34 mL/min — ABNORMAL LOW (ref >60)
Glucose, Bld: 172 mg/dL — ABNORMAL HIGH (ref 70–99)
Glucose, Bld: 164 mg/dL — ABNORMAL HIGH (ref 70–99)
Phosphorus: 2.3 mg/dL — ABNORMAL LOW (ref 2.5–4.6)
Phosphorus: 3.5 mg/dL (ref 2.5–4.6)
Potassium: 4.8 mmol/L (ref 3.5–5.1)
Potassium: 4.3 mmol/L (ref 3.5–5.1)
Sodium: 139 mmol/L (ref 135–145)
Sodium: 139 mmol/L (ref 135–145)

## 2019-09-05 LAB — CBC
HCT: 24.1 % — ABNORMAL LOW (ref 39.0–52.0)
Hemoglobin: 7.4 g/dL — ABNORMAL LOW (ref 13.0–17.0)
MCH: 30 pg (ref 26.0–34.0)
MCHC: 30.7 g/dL (ref 30.0–36.0)
MCV: 97.6 fL (ref 80.0–100.0)
Platelets: 175 10*3/uL (ref 150–400)
RBC: 2.47 MIL/uL — ABNORMAL LOW (ref 4.22–5.81)
RDW: 20.2 % — ABNORMAL HIGH (ref 11.5–15.5)
WBC: 12.1 10*3/uL — ABNORMAL HIGH (ref 4.0–10.5)
nRBC: 0.2 % (ref 0.0–0.2)

## 2019-09-05 LAB — CULTURE, BLOOD (ROUTINE X 2)
Culture: NO GROWTH
Culture: NO GROWTH

## 2019-09-05 LAB — HEPARIN LEVEL (UNFRACTIONATED): Heparin Unfractionated: 0.23 IU/mL — ABNORMAL LOW (ref 0.30–0.70)

## 2019-09-05 LAB — MAGNESIUM: Magnesium: 2.4 mg/dL (ref 1.7–2.4)

## 2019-09-05 LAB — APTT: aPTT: 99 seconds — ABNORMAL HIGH (ref 24–36)

## 2019-09-05 MED ORDER — HEPARIN SODIUM (PORCINE) 5000 UNIT/ML IJ SOLN: 5000.0000 [IU] | Freq: Three times a day (TID) | INTRAMUSCULAR | Status: DC

## 2019-09-05 MED ORDER — SODIUM PHOSPHATES 45 MMOLE/15ML IV SOLN
20.0000 mmol | Freq: Once | INTRAVENOUS | Status: AC
  Administered 2019-09-05: 20 mmol via INTRAVENOUS
  Filled 2019-09-05: qty 6.67

## 2019-09-05 MED ORDER — CEFAZOLIN SODIUM-DEXTROSE 2-4 GM/100ML-% IV SOLN: 2.0000 g | INTRAVENOUS | Status: DC

## 2019-09-05 NOTE — Progress Notes (Addendum)
Patient ID: Christian [name], male   DOB: 1946/02/15, 74 y.o.   MRN: [id number]     Advanced Heart Failure Rounding Note  PCP-Cardiologist: No primary care provider on file.   Subjective:    08/09/19 Admitted with NSTEM and ? Small RLL PE. Weight 213 pounds.  08/15/19 CABG x4 on 08/15/19 08/18/19 Cardiac arrest due to tamponade. Chest opened for washout 08/20/19 Echo post-pericardial evacuation: EF 40-45% with some residual pericardial clot. 4/26 Chest washout in OR 4/29 CVVH started 5/5 OR for chest washout, tracheostomy 5/7 HD catheter replaced 09/04/19 OR for washout/dressing change.   Remains on vent through trach.    Trach aspirate + rare pseudomonas  WBC 21k -> 16->10.9->CBC pending.   Heparin off except for CRT  Denies pain,.  Objective:   Weight Range: 85.3 kg Body mass index is 27.77 kg/m.   Vital Signs:   Temp:  [98 F (36.7 C)-98.7 F (37.1 C)] 98.5 F (36.9 C) (05/11 0400) Pulse Rate:  [75-105] 80 (05/11 0700) Resp:  [11-28] 18 (05/11 0700) BP: (108-155)/(54-81) 124/66 (05/11 0700) SpO2:  [95 %-100 %] 100 % (05/11 0700) FiO2 (%):  [30 %] 30 % (05/11 0420) Last BM Date: 09/04/19  Weight change: Filed Weights   09/01/19 0451 09/03/19 0421 09/04/19 0454  Weight: 98.5 kg 89 kg 85.3 kg    Intake/Output:   Intake/Output Summary (Last 24 hours) at 09/05/2019 0743 Last data filed at 09/05/2019 0700 Gross per 24 hour  Intake 2917.33 ml  Output 3420 ml  Net -502.67 ml      Physical Exam  General:  On vent through trach  HEENT: normal Neck: trach , supple. JVP hard to assess. Carotids 2+ bilat; no bruits. No lymphadenopathy or thryomegaly appreciated. Cor: PMI nondisplaced. Regular rate & rhythm. No rubs, gallops or murmurs. L subclavian HD catheter. VAC dressing in place  Lungs: clear Abdomen: soft, nontender, nondistended. No hepatosplenomegaly. No bruits or masses. Good bowel sounds. Extremities: no cyanosis, clubbing, rash, edema Neuro: alert follows  commands. MAE x3.    Telemetry   NSR 70-90s personally reviewed.   Labs    CBC Recent Labs    09/03/19 0337 09/04/19 0358  WBC 16.5* 10.9*  HGB 8.8* 8.5*  HCT 27.5* 27.4*  MCV 95.2 97.9  PLT 176 938*   Basic Metabolic Panel Recent Labs    09/04/19 0358 09/04/19 0358 09/04/19 1600 09/05/19 0434  NA 139   < > 137 139  K 4.4   < > 5.0 4.8  CL 103   < > 103 105  CO2 25   < > 23 25  GLUCOSE 82   < > 191* 172*  BUN 32*   < > 35* 34*  CREATININE 2.24*   < > 2.53* 2.12*  CALCIUM 8.0*   < > 8.1* 7.9*  MG 2.5*  --   --  2.4  PHOS 2.9   < > 3.3 2.3*   < > = values in this interval not displayed.   Liver Function Tests Recent Labs    09/04/19 1600 09/05/19 0434  ALBUMIN 2.3* 2.2*   No results for input(s): LIPASE, AMYLASE in the last 72 hours. Cardiac Enzymes No results for input(s): CKTOTAL, CKMB, CKMBINDEX, TROPONINI in the last 72 hours.  BNP: BNP (last 3 results) Recent Labs    08/09/19 1240 09/02/19 0311  BNP 89.1 761.0*    ProBNP (last 3 results) Recent Labs    11/23/18 1120 08/09/19 0922  PROBNP 18.0 78.0  D-Dimer No results for input(s): DDIMER in the last 72 hours. Hemoglobin A1C No results for input(s): HGBA1C in the last 72 hours. Fasting Lipid Panel No results for input(s): CHOL, HDL, LDLCALC, TRIG, CHOLHDL, LDLDIRECT in the last 72 hours. Thyroid Function Tests No results for input(s): TSH, T4TOTAL, T3FREE, THYROIDAB in the last 72 hours.  Invalid input(s): FREET3  Other results:   Imaging    No results found.   Medications:     Scheduled Medications: . sodium chloride   Intravenous Once  . sodium chloride   Intravenous Once  . amiodarone  200 mg Per Tube BID  . aspirin  81 mg Per Tube Daily  . atorvastatin  40 mg Per Tube Daily  . B-complex with vitamin C  1 tablet Per Tube Daily  . bisacodyl  10 mg Oral Daily   Or  . bisacodyl  10 mg Rectal Daily  . chlorhexidine gluconate (MEDLINE KIT)  15 mL Mouth Rinse BID    . Chlorhexidine Gluconate Cloth  6 each Topical Daily  . clopidogrel  75 mg Per Tube Daily  . darbepoetin (ARANESP) injection - NON-DIALYSIS  150 mcg Subcutaneous Q Mon-1800  . docusate  100 mg Per Tube BID  . feeding supplement (PRO-STAT SUGAR FREE 64)  60 mL Per Tube TID  . insulin aspart  0-24 Units Subcutaneous Q4H  . insulin detemir  10 Units Subcutaneous BID  . mouth rinse  15 mL Mouth Rinse 10 times per day  . midodrine  10 mg Oral TID WC  . pantoprazole sodium  40 mg Per Tube Daily  . sertraline  25 mg Oral Daily  . sodium chloride flush  10-40 mL Intracatheter Q12H  . sodium chloride flush  10-40 mL Intracatheter Q12H  . trospium  20 mg Per Tube BID    Infusions: .  prismasol BGK 4/2.5 500 mL/hr at 09/04/19 1730  .  prismasol BGK 4/2.5 300 mL/hr at 09/04/19 1800  . sodium chloride Stopped (09/01/19 1008)  . feeding supplement (VITAL 1.5 CAL) 1,000 mL (09/04/19 1130)  . fluconazole (DIFLUCAN) IV Stopped (09/04/19 2349)  . heparin 10,000 units/ 20 mL infusion syringe 1,000 Units/hr (09/05/19 0119)  . lactated ringers 20 mL/hr at 08/20/19 1200  . magnesium sulfate bolus IVPB    . meropenem (MERREM) IV Stopped (09/05/19 6060)  . norepinephrine (LEVOPHED) Adult infusion Stopped (09/02/19 0647)  . prismasol BGK 4/2.5 1,500 mL/hr at 09/04/19 1500  . sodium phosphate  Dextrose 5% IVPB    . vancomycin Stopped (09/04/19 1804)  . vasopressin (PITRESSIN) infusion - *FOR SHOCK* Stopped (09/02/19 1634)    PRN Medications: ALPRAZolam, cromolyn, dextrose, heparin, heparin, hydrALAZINE, HYDROmorphone (DILAUDID) injection, ondansetron (ZOFRAN) IV, oxyCODONE, sodium chloride flush, sodium chloride flush     Assessment/Plan   1. CAD: s/p PDA angioplasty + stent in 02/2014.  Admitted for NSTEMI 4/14. Hs Trop peaked at 3,309.  LHC w/ severe 3V CAD. Echo w/ reduced EF 30-35%. RV ok.  S/p CABG x 4 on 4/20 (LIMA-LAD, RIMA to PDA, Lt radial artery graft to 1st OM and distal OM branches of  LCx).  Chest reopened urgently at the bedside for cardiac tamponade and removal of clot on 4/23  - Continue ASA (off Plavix)  - Atorvastatin 40 daily.   - No change 2. Acute Systolic Heart Failure: Ischemic Cardiomyopathy -> cardiogenic shock.  LVEF severely reduced at 30-35%, in the setting of NSTEM/ Multivessel CAD, RV ok initially.  Underwent urgent sternotomy at the bedside for  cardiac tamponade and removal of clot on 4/23. Chest remains open. Luiz Blare numbers have been concerning for RV failure (swan now out).  Echo 4/24 EF 40 to 45% with inotropic support, small amount of residual pericardial clot, RV moderately hypokinetic.  On 5/7  Concern for developing septic shock with rising WBCs. Volume status per HD.  Off all pressors.   3.  Postoperative acute hypoxic respiratory failure: Reintubated on 4/23 due to cardiogenic shock and return to OR. Now with tracheostomy, FiO2 30% .  -  Full vent at night 4. AKI: Baseline SCr 0.9. Creatinine peaked to 5.3, suspect ATN in setting of cardiogenic shock/tamponade.  - Continue CVHD   5. Shock liver: Due to cardiac arrest on 4/23.  LFTs trending down.  6. 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 Maintaining NSR with PACs.   -Continue amiodarone per tube. - Off heparin due to bleeding. Consider NOAC prior to d/c  7.  Possible small right lower lobe PE: Chest CT 4/14 w/ small RLL PE.  Bilateral Venous Dopplers negative for DVT.  - Off heparin due to bleeding. Consider NOAC prior to d/c  8. T2DM: Hgb A1c 7.7 - Remains on insulin 9. Pericardial tamponade: 4/23 return to OR for clot removal, chest wall left open. Patient had been on heparin gtt for DCCV of atrial flutter.  - Returned to the OR 5/10. Chest remains open.   - Planning flap at possible later this week.  10. ID: Has been on vancomycin/cefepime with open chest. Cefepime switched to meropenem on 5/7 WBCs rising. Concern for infection/developing sepsis.  Blood cultures negative so far. Trach  aspirate + rare pseudomonas => Pseudomonas tracheitis versus PNA. PCT 4.95. HD catheter changed 5/7.  - Continue vancomycin/meropenem/fluconazole.   13. Post-op anemia - Check CBC  - heparin off  Length of Stay: [place] NP-C  7:43 AM   Patient seen with NP, agree with the above note.   Stable today on CVVH.  Continues on vancomycin/meropenem for Pseudomonas tracheitis.  I/Os negative 500 cc. He is awake/alert.   General: NAD Neck: JVP difficult with lines, no thyromegaly or thyroid nodule.  Lungs: Decreased at bases.  CV: Nondisplaced PMI.  Heart regular S1/S2, no S3/S4, no murmur.  Trace ankle edema.   Abdomen: Soft, nontender, no hepatosplenomegaly, no distention.  Skin: Intact without lesions or rashes.  Neurologic: Awake/alert Extremities: No clubbing or cyanosis.  HEENT: Normal.   Will continue CVVH today with gentle UF, weight down and suspect he is near dry weight. MAP stable on midodrine.   Continue current antimicrobials for Pseudomonas PNA vs tracheitis.   He remains in NSR on po amiodarone.  Not on anticoagulation until after surgery tomorrow.  - Start DVT prophylaxis heparin.   Unable to close chest.  Will plan on muscle flap tomorrow.  CRITICAL CARE Performed by: Loralie Champagne  Total critical care time: 35 minutes  Critical care time was exclusive of separately billable procedures and treating other patients.  Critical care was necessary to treat or prevent imminent or life-threatening deterioration.  Critical care was time spent personally by me on the following activities: development of treatment plan with patient and/or surrogate as well as nursing, discussions with consultants, evaluation of patient's response to treatment, examination of patient, obtaining history from patient or surrogate, ordering and performing treatments and interventions, ordering and review of laboratory studies, ordering and review of radiographic studies, pulse oximetry and  re-evaluation of patient's condition.  Loralie Champagne 09/05/2019 8:08 AM

## 2019-09-05 NOTE — Progress Notes (Signed)
Physical Therapy Treatment Patient Details Name: Christian [name] MRN: [id number] DOB: 1946-04-26 Today's Date: 09/05/2019    History of Present Illness 74 yo admitted with NSTEMI 4/15, RLL PE, 4/20 CABG x 4 remained open with wound VAC with repeated washouts. 4/23 cardiac arrest, 4/ 29 initiated CRRT, vent s/p trach. 5/10 additional sternal washout unable to close. Planned muscle flap 5/12. PMHx: BPH, CAD, DM, HLD, HTN    PT Comments    Pt awake, alert and following single step commands with bil UE and bil LE today. Pt in prior session with right inattention and no significant movement of left side with completely different presentation today. Pt able to perform long arc quads and seated marching bil LE with cues as well as attend to therapist on right. Pt sat in chair position 18 min with  5 min of trunk unsupported by surface with cues and facilitation for trunk extension and midline posture.   BP in sitting 77/53(62), HR 90 Return to supine 114/68 (79), HR 84 PRVC, 30% fio2    Follow Up Recommendations  LTACH;Supervision/Assistance - 24 hour     Equipment Recommendations  Other (comment)(defer to next venue)    Recommendations for Other Services       Precautions / Restrictions Precautions Precautions: Fall;Sternal Precaution Comments: CRRT, trach, vent, chest tubes, sternal VAC with open chest Restrictions Weight Bearing Restrictions: Yes RUE Weight Bearing: Non weight bearing RUE Partial Weight Bearing Percentage or Pounds: Open Sternum LUE Weight Bearing: Non weight bearing LUE Partial Weight Bearing Percentage or Pounds: Open Sternum    Mobility  Bed Mobility Overal bed mobility: Needs Assistance Bed Mobility: Supine to Sit;Sit to Supine           General bed mobility comments: Pt egressed bed to chair position with feet on floor x18 mins for BUE and LE exercises and sitting balance. Total +2 to slide toward HOB in supine  Transfers                  General transfer comment: pt not yet appropriate to attempt  Ambulation/Gait             General Gait Details: unable   Stairs             Wheelchair Mobility    Modified Rankin (Stroke Patients Only)       Balance Overall balance assessment: Needs assistance     Sitting balance - Comments: foot egress chair position pt with mod assist to scoot hips forward and perform anterior lean for trunk from surface. Min assist for midline posture as pt with posterior left bias Postural control: Posterior lean;Left lateral lean                                  Cognition Arousal/Alertness: Awake/alert Behavior During Therapy: WFL for tasks assessed/performed Overall Cognitive Status: Difficult to assess                                 General Comments: pt following single step commands      Exercises General Exercises - Lower Extremity Long Arc Quad: Seated;10 reps;Both;AROM Hip Flexion/Marching: AAROM;Both;Seated;10 reps    General Comments        Pertinent Vitals/Pain Faces Pain Scale: Hurts little more Pain Location: chest particularly with coughing Pain Intervention(s): Limited activity within patient's tolerance;Monitored during session;Repositioned  Home Living                      Prior Function            PT Goals (current goals can now be found in the care plan section) Progress towards PT goals: Progressing toward goals    Frequency    Min 3X/week      PT Plan Current plan remains appropriate    Co-evaluation PT/OT/SLP Co-Evaluation/Treatment: Yes Reason for Co-Treatment: Complexity of the patient's impairments (multi-system involvement);For patient/therapist safety PT goals addressed during session: Mobility/safety with mobility;Strengthening/ROM        AM-PAC PT "6 Clicks" Mobility   Outcome Measure  Help needed turning from your back to your side while in a flat bed without using  bedrails?: Total Help needed moving from lying on your back to sitting on the side of a flat bed without using bedrails?: Total Help needed moving to and from a bed to a chair (including a wheelchair)?: Total Help needed standing up from a chair using your arms (e.g., wheelchair or bedside chair)?: Total Help needed to walk in hospital room?: Total Help needed climbing 3-5 steps with a railing? : Total 6 Click Score: 6    End of Session   Activity Tolerance: Patient tolerated treatment well Patient left: in bed;with call bell/phone within reach;with nursing/sitter in room Nurse Communication: Mobility status;Need for lift equipment;Precautions PT Visit Diagnosis: Other abnormalities of gait and mobility (R26.89);Muscle weakness (generalized) (M62.81);Other symptoms and signs involving the nervous system (R29.898)     Time: 3818-4037 PT Time Calculation (min) (ACUTE ONLY): 30 min  Charges:  $Therapeutic Activity: 8-22 mins                     Elzina Devera P, PT Acute Rehabilitation Services Pager: [phone] Office: [place] 09/05/2019, 12:31 PM

## 2019-09-05 NOTE — Progress Notes (Signed)
Nutrition Follow-up  DOCUMENTATION CODES:   Not applicable  INTERVENTION:   Continue tube Feeding:  -Vital 1.5 at 50 ml/hr via post-pyloric Cortrak (1200 ml) -Pro-Stat 60 mL TID  Provides 2400 kcals, 171 g of protein and 917 mL of free water Meets 100% estimated needs  Continue B-complex with C while on CRRT  NUTRITION DIAGNOSIS:   Increased nutrient needs related to post-op healing as evidenced by estimated needs.  Ongoing  GOAL:   Patient will meet greater than or equal to 90% of their needs  Addressed via TF  MONITOR:   Vent status, Skin, TF tolerance, Weight trends, I & O's, Labs  REASON FOR ASSESSMENT:   Ventilator    ASSESSMENT:   Patient with PMH significant for CAD s/p stenting, HTN, HLD, DM, and BPH. Presents this admission with RLL PE and for CABG.   4/14- admitted  4/20- CABG x 4, chest left open 4/22- chest closure, application of incisional wound vac 4/23- extubated, cardiac arrest, chest re-opened for washout, clot removed, re-intubated 4/26- return to OR for washout, cortrak placed, TF initiated 4/29- chest washout, application of wound VAC, CRRT initiated 5/5- chest washout, tracheostomy 5/10- chest washout, wound VAC  Pt discussed during ICU rounds and with RN.   Off pressors. Back on vent s/p surgery. Continues on CRRT, UF to be increased. Plan harvest of omentum and closure tomorrow with plastics. Tolerating tube feeding at goal rate. Weight trending down- likely nearing dry weight.   Admission weight: 97 kg  Current weight: 85.3 kg   I/O: -17,259 ml since 4/27 CRRT: 2,905 ml x 24 hrs  Chest tubes: 40 ml x 24 hrs Stool: 250 ml x 24 hrs    Drips: sodium phosphate  Medications: b complex with vitamin C, dulcolax, aranesp, colace, SS novolog, levemir Labs: Cr 2.12- trending down Phosphorus 2.3 (L) CBG 146-191  Diet Order:   Diet Order            Diet NPO time specified  Diet effective now              EDUCATION NEEDS:    Not appropriate for education at this time  Skin:  Skin Assessment: Skin Integrity Issues: Skin Integrity Issues:: Stage II, Wound VAC, Incisions Stage II: upper lip Wound Vac: sternum Incisions: L arm Other: open chest s/p resternotomy  Last BM:  5/11  Height:   Ht Readings from Last 1 Encounters:  08/28/19 5\' 9"  (1.753 m)    Weight:   Wt Readings from Last 1 Encounters:  09/04/19 85.3 kg   BMI:  Body mass index is 27.77 kg/m.  Estimated Nutritional Needs:   Kcal:  2125-2550  Protein:  170-195 grams  Fluid:  >/= 2 L/day   Mariana Single RD, LDN Clinical Nutrition Pager listed in [place]

## 2019-09-05 NOTE — TOC Progression Note (Signed)
CM was advised by RN during rounding the pt's wife had questions about insurance paperwork.  CM spoke with pt's spouse at bedside who was worried about the Important Message from [facility] paper which the pt signed on arrival to the hospital. She advised that he didn't have Medicare but a Medicare Advantage plan.  CM explained the Important Message and that it was for all Medicare recipients, even if they have an advantage plan. Pt's spouse further had another paper from insurance that came in the mail. CM advised her to call the number on the paper for them to further explain the letters purpose. Pt's spouse denied further needs after clarification but still appeared hesitant on if she fully understood.  CM thanked both pt and spouse. TOC will continue to follow for needs.

## 2019-09-05 NOTE — Progress Notes (Signed)
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 4/23 cardiac arrest, 4/ 29 initiated CRRT, vent s/p trach. 5/10 additional sternal washout unable to close. Planned muscle flap 5/12. PMHx: BPH, CAD, DM, HLD, HTN   OT comments  Pt progressing to egress chair position with feet on floor x18 mins; (5 mins of forward sitting posture) for BUE and LE exercises and sitting balance. Vision has appeared to increase in scanning and tracking. BUE HEP performed for AROM. LUE appears stronger since last week. Cues required for sternal precautions. Pt continues to progress and benefit from OT acute skilled services. OT following.   30% FiO2, 5 PEEP >90% O2 throughout tasks, BP stable in sitting 114/68 (79)   Follow Up Recommendations  CIR;Supervision/Assistance - 24 hour    Equipment Recommendations  Other (comment)(defer to next facility)    Recommendations for Other Services      Precautions / Restrictions Precautions Precautions: Fall;Sternal Precaution Comments: CRRT, trach, vent, chest tubes, sternal VAC with open chest Restrictions Weight Bearing Restrictions: Yes RUE Weight Bearing: Non weight bearing RUE Partial Weight Bearing Percentage or Pounds: Open Sternum LUE Weight Bearing: Non weight bearing LUE Partial Weight Bearing Percentage or Pounds: Open Sternum Other Position/Activity Restrictions: sternal precautions       Mobility Bed Mobility Overal bed mobility: Needs Assistance Bed Mobility: Supine to Sit;Sit to Supine           General bed mobility comments: Pt egressed bed to chair position with feet on floor x18 mins; (5 mins of forward sitting posture) for BUE and LE exercises and sitting balance. Total +2 to slide toward HOB in supine  Transfers                 General transfer comment: pt not yet appropriate to attempt    Balance Overall balance assessment: Needs assistance     Sitting balance - Comments: foot egress chair position Postural control: Posterior lean;Left lateral lean                                 ADL either performed or assessed with clinical judgement   ADL Overall ADL's : Needs assistance/impaired                                     Functional mobility during ADLs: Maximal assistance;+2 for physical assistance;+2 for safety/equipment General ADL Comments: Pt limited by decreased corodination and increased swelling in BLEs; decreased ability to care for self and decreased activity tolerance.     Vision   Vision Assessment?: Vision impaired- to be further tested in functional context Eye Alignment: Within Functional Limits Ocular Range of Motion: Within Functional Limits Alignment/Gaze Preference: Within Defined Limits Tracking/Visual Pursuits: Able to track stimulus in all quads without difficulty Additional Comments: Vision has appeared to increase in scanning and tracking.   Perception     Praxis      Cognition Arousal/Alertness: Awake/alert Behavior During Therapy: WFL for tasks assessed/performed Overall Cognitive Status: Difficult to assess  General Comments: pt following single step commands        Exercises Exercises: General Upper Extremity General Exercises - Upper Extremity Shoulder Flexion: AAROM;Both;10 reps;Seated Elbow Flexion: AROM;AAROM;Both;10 reps;Seated;Supine Elbow Extension: AAROM;AROM;Both;10 reps;Seated;Supine Wrist Flexion: AROM;Both;10 reps;Supine Wrist Extension: AROM;10 reps;AAROM;Supine Digit Composite Flexion: AROM;Both;10 reps;Seated;Supine Composite Extension: AROM;Both;10 reps;Seated   Shoulder Instructions       General Comments 30% FiO2, 5 PEEP >90% O2 throughout tasks, BP  stable in sitting 114/68 (79)    Pertinent Vitals/ Pain       Pain Assessment: 0-10 Faces Pain Scale: Hurts little more Pain Location: chest particularly with coughing Pain Intervention(s): Limited activity within patient's tolerance;Monitored during session  Home Living                                          Prior Functioning/Environment              Frequency  Min 2X/week        Progress Toward Goals  OT Goals(current goals can now be found in the care plan section)  Progress towards OT goals: Progressing toward goals  Acute Rehab OT Goals Patient Stated Goal: to get OOB OT Goal Formulation: With patient Time For Goal Achievement: 09/16/19 Potential to Achieve Goals: Good ADL Goals Pt Will Perform Grooming: with set-up;sitting Pt Will Transfer to Toilet: with mod assist;with +2 assist;squat pivot transfer Pt/caregiver will Perform Home Exercise Program: Increased strength;Increased ROM;Both right and left upper extremity;With written HEP provided Additional ADL Goal #1: Pt will increase to modA for bed mobility as precursor for OOB ADL and mobility. Additional ADL Goal #2: Pt will complete x7 mins of ADL task with 1 rest break in order to increase activity tolerance. Additional ADL Goal #3: Pt will follow (2) multi step commands with minimal cues using visual perception activities in order to increase awareness of possible visual deficits.  Plan Discharge plan remains appropriate    Co-evaluation    PT/OT/SLP Co-Evaluation/Treatment: Yes Reason for Co-Treatment: Complexity of the patient's impairments (multi-system involvement)   OT goals addressed during session: Strengthening/ROM      AM-PAC OT "6 Clicks" Daily Activity     Outcome Measure   Help from another person eating meals?: Total Help from another person taking care of personal grooming?: A Lot Help from another person toileting, which includes using toliet, bedpan, or urinal?:  Total Help from another person bathing (including washing, rinsing, drying)?: Total Help from another person to put on and taking off regular upper body clothing?: A Lot Help from another person to put on and taking off regular lower body clothing?: Total 6 Click Score: 8    End of Session Equipment Utilized During Treatment: Oxygen  OT Visit Diagnosis: Unsteadiness on feet (R26.81);Muscle weakness (generalized) (M62.81);Pain Pain - part of body: ("all over")   Activity Tolerance Patient limited by fatigue;Patient limited by lethargy   Patient Left in bed;with call bell/phone within reach;with bed alarm set;with nursing/sitter in room   Nurse Communication Mobility status        Time: 8938-1017 OT Time Calculation (min): 33 min  Charges: OT General Charges $OT Visit: 1 Visit OT Treatments $Therapeutic Activity: 8-22 mins  Jefferey Pica, OTR/L Acute Rehabilitation Services Pager: [phone] Office: [phone]    Genieve Ramaswamy C 09/05/2019, 5:21 PM

## 2019-09-05 NOTE — H&P (View-Only) (Signed)
[facility]  Reason for Consult: Sternal Wound Referring Physician: Dr. Gerhardt  Mihailo S Parish is an 74 y.o. male.  HPI: Patient is a 74-year-old male who presented to the ED for chest pain on 08/09/2019.  Patient has a past medical history of CAD, HTN, hyperlipidemia, DM.  He reported to cardiology that his symptoms of chest pain and dyspnea had gotten worse.  On evaluation in the ED on 414 patient was noted to have a right lower lobe pulmonary embolism and EKG with nonspecific ST T wave changes.  Patient was admitted by cardiology for further evaluation and taken to the Cath Lab on 08/10/2019.  Patient was found to have CAD and it was recommended that patient undergo coronary bypass grafting.  Patient underwent coronary artery bypass grafting x4 on 08/15/2019.  Patient had cardiac arrest on 08/18/2019 and patient urgently underwent open chest.  Patient subsequently underwent chest washout on 08/21/2019 and 08/24/2019.  Patient subsequently went additional washout, tracheostomy and wound VAC change on 08/29/2019.  Most recently patient underwent sternal wound exploration and irrigation and application of wound VAC on 09/04/2019.  Plastic surgery was consulted for assistance with closure of sternal wound.  On evaluation today patient is alert and oriented, difficulty speaking due to tracheostomy.  Nursing staff at bedside.  Past Medical History:  Diagnosis Date  . BPH (benign prostatic hypertrophy)   . Coronary artery disease   . Diabetes mellitus without complication (HCC)   . Dry eyes left  . Hiatal hernia   . 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  LIMA TO LAD, RIMA TO PDA, RADIAL ARTERY TO CIRC AND SEQUENTIALLY TO OM1. 08/15/2019  . Hyperlipidemia   . Hypertension   . Incomplete bladder emptying   . Nocturia   . 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 SOB (shortness of breath) on exertion     Past Surgical History:  Procedure Laterality Date  . APPLICATION OF WOUND VAC N/A 08/24/2019   Procedure: APPLICATION OF WOUND VAC;  Surgeon: Atkins, Broadus Z, MD;  [facility];  Service: Thoracic;  Laterality: N/A;  . APPLICATION OF WOUND VAC  08/29/2019   Procedure: Wound Vac change;  Surgeon: Atkins, Broadus Z, MD;  [facility];  Service: Open Heart Surgery;;  . APPLICATION OF WOUND VAC N/A 09/04/2019   Procedure: Application Of Wound Vac;  Surgeon: Gerhardt, Edward B, MD;  [facility];  Service: Open Heart Surgery;  Laterality: N/A;  . CARDIAC CATHETERIZATION    . CORONARY ARTERY BYPASS GRAFT N/A 08/15/2019   Procedure: CORONARY ARTERY BYPASS GRAFTING (CABG), x 4 using bilateral IMAs and left radial artery .  LIMA TO LAD, RIMA TO PDA, RADIAL ARTERY TO CIRC AND SEQUENTIALLY TO OM1.;  Surgeon: Atkins, Broadus Z, MD;  [facility];  Service: Open Heart Surgery;  Laterality: N/A;  . CORONARY STENT PLACEMENT  02/27/2014   distal rt/pd coronary       dr ganji  . CYSTO/ BLADDER BIOPSY'S/ CAUTHERIZATION  01-14-2004  DR TANNENBAUM  . EXPLORATION POST OPERATIVE OPEN HEART N/A 08/16/2019   Procedure: Chest Closure S?P CABG WITH APPLICATION OF PREVENA  INCISIONAL WOUND VAC;  Surgeon: Atkins, Broadus Z, MD;  [facility];  Service: Open Heart Surgery;  Laterality: N/A;  . EXPLORATION POST OPERATIVE OPEN HEART N/A 08/21/2019   Procedure:   CHEST WASHOUT S/P OPEN CHEST;  Surgeon: Atkins, Broadus Z, MD;  [facility];  Service: Open Heart Surgery;  Laterality: N/A;  Open chest with Esmark dressing with Ioban sealant coverage.  . EXPLORATION POST OPERATIVE OPEN HEART N/A 08/18/2019   Procedure: EXPLORATION POST OPERATIVE OPEN HEART (performed 04/23 on unit);  Surgeon: Atkins, Broadus Z, MD;  [facility];  Service: Open Heart Surgery;  Laterality: N/A;  . EXPLORATION POST OPERATIVE OPEN HEART N/A 08/24/2019   Procedure: CHEST  WASHOUT POST OPERATIVE OPEN HEART;  Surgeon: Atkins, Broadus Z, MD;  [facility];  Service: Open Heart Surgery;  Laterality: N/A;  . EXPLORATION POST OPERATIVE OPEN HEART N/A 08/29/2019   Procedure: CHEST WOUND WASHOUT POST OPERATIVE OPEN HEART;  Surgeon: Atkins, Broadus Z, MD;  [facility];  Service: Open Heart Surgery;  Laterality: N/A;  . EXPLORATION POST OPERATIVE OPEN HEART N/A 09/04/2019   Procedure: MEDIASTINAL EXPLORATION WITH STERNAL WOUND IRRIGATION;  Surgeon: Gerhardt, Edward B, MD;  [facility];  Service: Open Heart Surgery;  Laterality: N/A;  . LEFT HEART CATH AND CORONARY ANGIOGRAPHY N/A 08/10/2019   Procedure: LEFT HEART CATH AND CORONARY ANGIOGRAPHY;  Surgeon: Patwardhan, Manish J, MD;  [facility];  Service: Cardiovascular;  Laterality: N/A;  . LEFT HEART CATHETERIZATION WITH CORONARY ANGIOGRAM N/A 02/27/2014   Procedure: LEFT HEART CATHETERIZATION WITH CORONARY ANGIOGRAM;  Surgeon: Jagadeesh R Ganji, MD;  [facility];  Service: Cardiovascular;  Laterality: N/A;  . PERCUTANEOUS CORONARY STENT INTERVENTION (PCI-S)  02/27/2014   Procedure: PERCUTANEOUS CORONARY STENT INTERVENTION (PCI-S);  Surgeon: Jagadeesh R Ganji, MD;  [facility];  Service: Cardiovascular;;  rt PDA  3.0/28mm Promus stent  . RADIAL ARTERY HARVEST Left 08/15/2019   Procedure: Radial Artery Harvest;  Surgeon: Atkins, Broadus Z, MD;  [facility];  Service: Open Heart Surgery;  Laterality: Left;  . TEE WITHOUT CARDIOVERSION N/A 08/15/2019   Procedure: TRANSESOPHAGEAL ECHOCARDIOGRAM (TEE);  Surgeon: Atkins, Broadus Z, MD;  [facility];  Service: Open Heart Surgery;  Laterality: N/A;  . TRACHEOSTOMY TUBE PLACEMENT  08/29/2019   Procedure: Tracheostomy;  Surgeon: Atkins, Broadus Z, MD;  [facility];  Service: Open Heart Surgery;;  . TRANSURETHRAL RESECTION OF PROSTATE  04/04/2012   Procedure: TRANSURETHRAL RESECTION OF THE PROSTATE WITH GYRUS INSTRUMENTS;  Surgeon:  Sigmund I Tannenbaum, MD;  Location: [facility];  Service: Urology;  Laterality: N/A;  . TRANSURETHRAL RESECTION OF PROSTATE N/A 09/27/2014   Procedure: TRANSURETHRAL RESECTION OF THE PROSTATE ;  Surgeon: Sigmund Tannenbaum, MD;  Location: [facility];  Service: Urology;  Laterality: N/A;  . UPPER GASTROINTESTINAL ENDOSCOPY      Family History  Problem Relation Age of Onset  . Diabetes Mother   . Hypertension Mother   . Diabetes Father   . Diabetes Brother   . Hypertension Brother   . Diabetes Brother     Social History:  reports that he quit smoking about 29 years ago. His smoking use included cigarettes. He has a 10.00 pack-year smoking history. He has never used smokeless tobacco. He reports current alcohol use. He reports that he does not use drugs.  Allergies: No Known Allergies  Medications:  I have reviewed the patient's current medications. Scheduled: . sodium chloride   Intravenous Once  . sodium chloride   Intravenous Once  . amiodarone  200 mg Per Tube BID  . aspirin  81 mg Per Tube Daily  . atorvastatin  40 mg   Per Tube Daily  . B-complex with vitamin C  1 tablet Per Tube Daily  . bisacodyl  10 mg Oral Daily   Or  . bisacodyl  10 mg Rectal Daily  . chlorhexidine gluconate (MEDLINE KIT)  15 mL Mouth Rinse BID  . Chlorhexidine Gluconate Cloth  6 each Topical Daily  . clopidogrel  75 mg Per Tube Daily  . darbepoetin (ARANESP) injection - NON-DIALYSIS  150 mcg Subcutaneous Q Mon-1800  . docusate  100 mg Per Tube BID  . feeding supplement (PRO-STAT SUGAR FREE 64)  60 mL Per Tube TID  . insulin aspart  0-24 Units Subcutaneous Q4H  . insulin detemir  10 Units Subcutaneous BID  . mouth rinse  15 mL Mouth Rinse 10 times per day  . midodrine  10 mg Oral TID WC  . pantoprazole sodium  40 mg Per Tube Daily  . sertraline  25 mg Oral Daily  . sodium chloride flush  10-40 mL Intracatheter Q12H  . sodium chloride flush  10-40 mL Intracatheter Q12H  . trospium  20 mg  Per Tube BID   Continuous: .  prismasol BGK 4/2.5 500 mL/hr at 09/04/19 1730  .  prismasol BGK 4/2.5 300 mL/hr at 09/04/19 1800  . sodium chloride Stopped (09/01/19 1008)  . feeding supplement (VITAL 1.5 CAL) 50 mL/hr at 09/05/19 1300  . fluconazole (DIFLUCAN) IV Stopped (09/04/19 2349)  . heparin 10,000 units/ 20 mL infusion syringe 1,000 Units/hr (09/05/19 0959)  . lactated ringers 20 mL/hr at 08/20/19 1200  . meropenem (MERREM) IV Stopped (09/05/19 3557)  . norepinephrine (LEVOPHED) Adult infusion Stopped (09/02/19 0647)  . prismasol BGK 4/2.5 1,500 mL/hr at 09/05/19 1313  . sodium phosphate  Dextrose 5% IVPB 43 mL/hr at 09/05/19 1300  . vancomycin Stopped (09/04/19 1804)  . vasopressin (PITRESSIN) infusion - *FOR SHOCK* Stopped (09/02/19 1634)    Results for orders placed or performed during the hospital encounter of 08/09/19 (from the past 48 hour(s))  Renal function panel (daily at 1600)     Status: Abnormal   Collection Time: 09/03/19  3:30 PM  Result Value Ref Range   Sodium 139 135 - 145 mmol/L   Potassium 4.2 3.5 - 5.1 mmol/L   Chloride 104 98 - 111 mmol/L   CO2 25 22 - 32 mmol/L   Glucose, Bld 135 (H) 70 - 99 mg/dL    Comment: Glucose reference range applies only to samples taken after fasting for at least 8 hours.   BUN 40 (H) 8 - 23 mg/dL   Creatinine, Ser 2.47 (H) 0.61 - 1.24 mg/dL   Calcium 8.0 (L) 8.9 - 10.3 mg/dL   Phosphorus 2.3 (L) 2.5 - 4.6 mg/dL   Albumin 2.1 (L) 3.5 - 5.0 g/dL   GFR calc non Af Amer 25 (L) >60 mL/min   GFR calc Af Amer 29 (L) >60 mL/min   Anion gap 10 5 - 15    Comment: Performed at [facility] [address], [place], [place] [address]  Type and screen     Status: None   Collection Time: 09/03/19  3:30 PM  Result Value Ref Range   ABO/RH(D) O POS    Antibody Screen NEG    Sample Expiration      09/06/2019,2359 Performed at [facility], [place] [address] [place], [place] [address]   Glucose, capillary     Status:  Abnormal   Collection Time: 09/03/19  3:32 PM  Result Value Ref Range  Glucose-Capillary 138 (H) 70 - 99 mg/dL    Comment: Glucose reference range applies only to samples taken after fasting for at least 8 hours.  Glucose, capillary     Status: Abnormal   Collection Time: 09/03/19  4:15 PM  Result Value Ref Range   Glucose-Capillary 126 (H) 70 - 99 mg/dL    Comment: Glucose reference range applies only to samples taken after fasting for at least 8 hours.  Glucose, capillary     Status: Abnormal   Collection Time: 09/03/19  7:42 PM  Result Value Ref Range   Glucose-Capillary 159 (H) 70 - 99 mg/dL    Comment: Glucose reference range applies only to samples taken after fasting for at least 8 hours.  Glucose, capillary     Status: Abnormal   Collection Time: 09/04/19 12:16 AM  Result Value Ref Range   Glucose-Capillary 176 (H) 70 - 99 mg/dL    Comment: Glucose reference range applies only to samples taken after fasting for at least 8 hours.  Renal function panel (daily at 0500)     Status: Abnormal   Collection Time: 09/04/19  3:58 AM  Result Value Ref Range   Sodium 139 135 - 145 mmol/L   Potassium 4.4 3.5 - 5.1 mmol/L   Chloride 103 98 - 111 mmol/L   CO2 25 22 - 32 mmol/L   Glucose, Bld 82 70 - 99 mg/dL    Comment: Glucose reference range applies only to samples taken after fasting for at least 8 hours.   BUN 32 (H) 8 - 23 mg/dL   Creatinine, Ser 2.24 (H) 0.61 - 1.24 mg/dL   Calcium 8.0 (L) 8.9 - 10.3 mg/dL   Phosphorus 2.9 2.5 - 4.6 mg/dL   Albumin 2.0 (L) 3.5 - 5.0 g/dL   GFR calc non Af Amer 28 (L) >60 mL/min   GFR calc Af Amer 33 (L) >60 mL/min   Anion gap 11 5 - 15    Comment: Performed at [facility], [address] [place], [place] [address]  Magnesium     Status: Abnormal   Collection Time: 09/04/19  3:58 AM  Result Value Ref Range   Magnesium 2.5 (H) 1.7 - 2.4 mg/dL    Comment: Performed at [facility], [address] [place], [place] [address]  APTT      Status: None   Collection Time: 09/04/19  3:58 AM  Result Value Ref Range   aPTT 30 24 - 36 seconds    Comment: Performed at [facility], [address] [place], [place] [address]  CBC     Status: Abnormal   Collection Time: 09/04/19  3:58 AM  Result Value Ref Range   WBC 10.9 (H) 4.0 - 10.5 K/uL   RBC 2.80 (L) 4.22 - 5.81 MIL/uL   Hemoglobin 8.5 (L) 13.0 - 17.0 g/dL   HCT 27.4 (L) 39.0 - 52.0 %   MCV 97.9 80.0 - 100.0 fL   MCH 30.4 26.0 - 34.0 pg   MCHC 31.0 30.0 - 36.0 g/dL   RDW 19.4 (H) 11.5 - 15.5 %   Platelets 116 (L) 150 - 400 K/uL    Comment: SPECIMEN CHECKED FOR CLOTS Immature Platelet Fraction may be clinically indicated, consider ordering this additional test [id number] PLATELET COUNT CONFIRMED BY SMEAR REPEATED TO VERIFY    nRBC 1.6 (H) 0.0 - 0.2 %    Comment: Performed at [facility], [address] [place], [place]   [address]  Glucose, capillary     Status: None   Collection Time: 09/04/19  3:58 AM  Result Value Ref Range   Glucose-Capillary 83 70 - 99 mg/dL    Comment: Glucose reference range applies only to samples taken after fasting for at least 8 hours.  POCT Activated clotting time     Status: None   Collection Time: 09/04/19  4:44 AM  Result Value Ref Range   Activated Clotting Time 153 seconds  Glucose, capillary     Status: None   Collection Time: 09/04/19  5:32 AM  Result Value Ref Range   Glucose-Capillary 84 70 - 99 mg/dL    Comment: Glucose reference range applies only to samples taken after fasting for at least 8 hours.  Aerobic Culture (superficial specimen)     Status: None (Preliminary result)   Collection Time: 09/04/19  8:22 AM   Specimen: Wound  Result Value Ref Range   Specimen Description WOUND    Special Requests STERNAL WOUND SPEC A    Gram Stain NO WBC SEEN NO ORGANISMS SEEN     Culture      NO GROWTH 1 DAY Performed at [facility], [place] [address] [place], [place] [address]    Report Status PENDING    Glucose, capillary     Status: Abnormal   Collection Time: 09/04/19 11:32 AM  Result Value Ref Range   Glucose-Capillary 143 (H) 70 - 99 mg/dL    Comment: Glucose reference range applies only to samples taken after fasting for at least 8 hours.  Glucose, capillary     Status: Abnormal   Collection Time: 09/04/19  3:20 PM  Result Value Ref Range   Glucose-Capillary 180 (H) 70 - 99 mg/dL    Comment: Glucose reference range applies only to samples taken after fasting for at least 8 hours.  Renal function panel (daily at 1600)     Status: Abnormal   Collection Time: 09/04/19  4:00 PM  Result Value Ref Range   Sodium 137 135 - 145 mmol/L   Potassium 5.0 3.5 - 5.1 mmol/L   Chloride 103 98 - 111 mmol/L   CO2 23 22 - 32 mmol/L   Glucose, Bld 191 (H) 70 - 99 mg/dL    Comment: Glucose reference range applies only to samples taken after fasting for at least 8 hours.   BUN 35 (H) 8 - 23 mg/dL   Creatinine, Ser 2.53 (H) 0.61 - 1.24 mg/dL   Calcium 8.1 (L) 8.9 - 10.3 mg/dL   Phosphorus 3.3 2.5 - 4.6 mg/dL   Albumin 2.3 (L) 3.5 - 5.0 g/dL   GFR calc non Af Amer 24 (L) >60 mL/min   GFR calc Af Amer 28 (L) >60 mL/min   Anion gap 11 5 - 15    Comment: Performed at [facility] [address], [place], [place] [address]  Glucose, capillary     Status: Abnormal   Collection Time: 09/04/19  7:51 PM  Result Value Ref Range   Glucose-Capillary 152 (H) 70 - 99 mg/dL    Comment: Glucose reference range applies only to samples taken after fasting for at least 8 hours.  Glucose, capillary     Status: Abnormal   Collection Time: 09/04/19 11:46 PM  Result Value Ref Range   Glucose-Capillary 163 (H) 70 - 99 mg/dL    Comment: Glucose reference range applies only to samples taken after fasting for at least 8 hours.  Glucose, capillary  Status: Abnormal   Collection Time: 09/05/19  4:25 AM  Result Value Ref Range   Glucose-Capillary 162 (H) 70 - 99 mg/dL    Comment: Glucose reference range  applies only to samples taken after fasting for at least 8 hours.  Magnesium     Status: None   Collection Time: 09/05/19  4:34 AM  Result Value Ref Range   Magnesium 2.4 1.7 - 2.4 mg/dL    Comment: Performed at [facility], [address] [place], [place] [address]  APTT     Status: Abnormal   Collection Time: 09/05/19  4:34 AM  Result Value Ref Range   aPTT 99 (H) 24 - 36 seconds    Comment:        IF BASELINE aPTT IS ELEVATED, SUGGEST PATIENT RISK ASSESSMENT BE USED TO DETERMINE APPROPRIATE ANTICOAGULANT THERAPY. Performed at [facility], [address] [place], [place] [address]   Renal function panel     Status: Abnormal   Collection Time: 09/05/19  4:34 AM  Result Value Ref Range   Sodium 139 135 - 145 mmol/L   Potassium 4.8 3.5 - 5.1 mmol/L   Chloride 105 98 - 111 mmol/L   CO2 25 22 - 32 mmol/L   Glucose, Bld 172 (H) 70 - 99 mg/dL    Comment: Glucose reference range applies only to samples taken after fasting for at least 8 hours.   BUN 34 (H) 8 - 23 mg/dL   Creatinine, Ser 2.12 (H) 0.61 - 1.24 mg/dL   Calcium 7.9 (L) 8.9 - 10.3 mg/dL   Phosphorus 2.3 (L) 2.5 - 4.6 mg/dL   Albumin 2.2 (L) 3.5 - 5.0 g/dL   GFR calc non Af Amer 30 (L) >60 mL/min   GFR calc Af Amer 35 (L) >60 mL/min   Anion gap 9 5 - 15    Comment: Performed at [facility], [address] [place], [place] [address]  CBC     Status: Abnormal   Collection Time: 09/05/19  8:12 AM  Result Value Ref Range   WBC 12.1 (H) 4.0 - 10.5 K/uL   RBC 2.47 (L) 4.22 - 5.81 MIL/uL   Hemoglobin 7.4 (L) 13.0 - 17.0 g/dL   HCT 24.1 (L) 39.0 - 52.0 %   MCV 97.6 80.0 - 100.0 fL   MCH 30.0 26.0 - 34.0 pg   MCHC 30.7 30.0 - 36.0 g/dL   RDW 20.2 (H) 11.5 - 15.5 %   Platelets 175 150 - 400 K/uL   nRBC 0.2 0.0 - 0.2 %    Comment: Performed at [facility], [address] [place], [place] [address]  Glucose, capillary     Status: Abnormal   Collection Time: 09/05/19  8:35 AM  Result Value Ref  Range   Glucose-Capillary 163 (H) 70 - 99 mg/dL    Comment: Glucose reference range applies only to samples taken after fasting for at least 8 hours.  Glucose, capillary     Status: Abnormal   Collection Time: 09/05/19 11:50 AM  Result Value Ref Range   Glucose-Capillary 146 (H) 70 - 99 mg/dL    Comment: Glucose reference range applies only to samples taken after fasting for at least 8 hours.    DG Chest Port 1 View  Result Date: 09/04/2019 CLINICAL DATA:  Chest tube EXAM: PORTABLE CHEST 1 VIEW COMPARISON:  09/03/2019 FINDINGS: Support devices including right chest tube remain in place, unchanged. No pneumothorax. Left lower   lobe airspace opacity and nodular density in the right mid lung are unchanged. Heart is borderline in size. No visible effusions or acute bony abnormality. IMPRESSION: No significant change since prior study. Electronically Signed   By: Rolm Baptise M.D.   On: 09/04/2019 08:12    Review of Systems  Constitutional: Negative.   Cardiovascular: Negative.   Gastrointestinal: Negative.    Blood pressure 135/66, pulse 76, temperature 98.5 F (36.9 C), resp. rate (!) 21, height 5' 9" (1.753 m), weight 85.3 kg, SpO2 100 %. Physical Exam  Constitutional: No distress.  Ill-appearing elderly male resting in bed  HENT:  Head: Normocephalic and atraumatic.  NG tube in place, HD line in place  Respiratory:    Tracheostomy tube in place. Chest tube in place   Neurological: He is alert.  Unable to verbalize due to tracheostomy tube   Skin: He is not diaphoretic.    Assessment/Plan:  Patient is a 74 year old male with large wound of sternum, plan for possible left pectoralis turnover flap, possible placement of ACell, possible placement of wound VAC on 09/06/2019 with Dr. Marla Roe.  Surgery is coordinated with Dr. Servando Snare and Dr. Kieth Brightly. Surgical plan also to involves possible closure of sternum wound via omental flap and possible plating of sternum.  Appreciate  the consult and the opportunity to participate in patient's care. Please call with any questions or concerns.  Carola Rhine Yassmin Binegar, PA-C 09/05/2019, 1:32 PM

## 2019-09-05 NOTE — Progress Notes (Signed)
[facility] BRIEF SICU PROGRESS NOTE  1 Day Post-Op  S/P Procedure(s) (LRB): MEDIASTINAL EXPLORATION WITH STERNAL WOUND IRRIGATION (N/A) Application Of Wound Vac (N/A)   Stable day  Plan: Continue current plan  Rexene Alberts, MD 09/05/2019 7:21 PM

## 2019-09-05 NOTE — Progress Notes (Signed)
Patient ID: Christian [name], male   DOB: [birth year], 74 y.o.   MRN: [id number] S:  removed only 502 with CRRT-  Is alert- no pressors   O:BP 124/66   Pulse 80   Temp 98.5 F (36.9 C) (Oral)   Resp 18   Ht _0  (1.753 m)   Wt 85.3 kg   SpO2 100%   BMI 27.77 kg/m   Intake/Output Summary (Last 24 hours) at 09/05/2019 0709 Last data filed at 09/05/2019 0700 Gross per 24 hour  Intake 2917.33 ml  Output 3420 ml  Net -502.67 ml   Intake/Output: I/O last 3 completed shifts: In: 0569 [I.V.:920; NG/GT:1225; IV Piggyback:1560] Out: 7948 [Drains:240; AXKPV:3748; Stool:425; Blood:50; Chest Tube:60]  Intake/Output this shift:  No intake/output data recorded. Weight change:  Gen: on vent via trach left subclavian HD cath placed 5/7 CVS: RRR, no rub, open chest wound with wound vac in place Resp: cta Abd: distended, +BS, soft, NT Ext: 1+ edema  Recent Labs  Lab 09/01/19 0349 09/02/19 0123 09/02/19 0311 09/02/19 1530 09/03/19 0337 09/03/19 1530 09/04/19 0358 09/04/19 1600 09/05/19 0434  NA 138   < > 137 139 140 139 139 137 139  K 4.3   < > 3.9 4.4 4.4 4.2 4.4 5.0 4.8  CL 101   < > 104 105 104 104 103 103 105  CO2 22   < > 20* _1 GLUCOSE 209*   < > 238* 138* 167* 135* 82 191* 172*  BUN 56*   < > 87* 75* 53* 40* 32* 35* 34*  CREATININE 2.57*   < > 4.33* 3.94* 2.87* 2.47* 2.24* 2.53* 2.12*  ALBUMIN 2.2*  --  2.1* 2.4* 2.3* 2.1* 2.0* 2.3* 2.2*  CALCIUM 8.3*   < > 8.2* 8.4* 8.4* 8.0* 8.0* 8.1* 7.9*  PHOS 3.1  --   --  2.9 2.3* 2.3* 2.9 3.3 2.3*  AST  --   --  38  --   --   --   --   --   --   ALT  --   --  49*  --   --   --   --   --   --    < > = values in this interval not displayed.   Liver Function Tests: Recent Labs  Lab 09/02/19 0311 09/02/19 1530 09/04/19 0358 09/04/19 1600 09/05/19 0434  AST 38  --   --   --   --   ALT 49*  --   --   --   --   ALKPHOS 104  --   --   --   --   BILITOT 1.0  --   --   --   --   PROT 5.1*  --   --   --   --    ALBUMIN 2.1*   < > 2.0* 2.3* 2.2*   < > = values in this interval not displayed.   No results for input(s): LIPASE, AMYLASE in the last 168 hours. No results for input(s): AMMONIA in the last 168 hours. CBC: Recent Labs  Lab 08/31/19 0353 08/31/19 0353 09/01/19 0349 09/01/19 1758 09/02/19 0311 09/02/19 0945 09/02/19 2308 09/03/19 0337 09/04/19 0358  WBC 27.3*   < > 33.6*  --  21.6*  --   --  16.5* 10.9*  NEUTROABS 22.0*  --  25.7*  --  16.8*  --   --   --   --  HGB 7.9*   < > 7.4*   < > 6.4*   < > 8.4* 8.8* 8.5*  HCT 25.0*   < > 23.9*   < > 20.0*   < > 26.8* 27.5* 27.4*  MCV 96.9  --  98.0  --  96.2  --   --  95.2 97.9  PLT 298   < > 265  --  184  --   --  176 116*   < > = values in this interval not displayed.   Cardiac Enzymes: No results for input(s): CKTOTAL, CKMB, CKMBINDEX, TROPONINI in the last 168 hours. CBG: Recent Labs  Lab 09/04/19 1132 09/04/19 1520 09/04/19 1951 09/04/19 2346 09/05/19 0425  GLUCAP 143* 180* 152* 163* 162*    Iron Studies: No results for input(s): IRON, TIBC, TRANSFERRIN, FERRITIN in the last 72 hours. Studies/Results: DG Chest Port 1 View  Result Date: 09/04/2019 CLINICAL DATA:  Chest tube EXAM: PORTABLE CHEST 1 VIEW COMPARISON:  09/03/2019 FINDINGS: Support devices including right chest tube remain in place, unchanged. No pneumothorax. Left lower lobe airspace opacity and nodular density in the right mid lung are unchanged. Heart is borderline in size. No visible effusions or acute bony abnormality. IMPRESSION: No significant change since prior study. Electronically Signed   By: Rolm Baptise M.D.   On: 09/04/2019 08:12   . sodium chloride   Intravenous Once  . sodium chloride   Intravenous Once  . amiodarone  200 mg Per Tube BID  . aspirin  81 mg Per Tube Daily  . atorvastatin  40 mg Per Tube Daily  . B-complex with vitamin C  1 tablet Per Tube Daily  . bisacodyl  10 mg Oral Daily   Or  . bisacodyl  10 mg Rectal Daily  .  chlorhexidine gluconate (MEDLINE KIT)  15 mL Mouth Rinse BID  . Chlorhexidine Gluconate Cloth  6 each Topical Daily  . clopidogrel  75 mg Per Tube Daily  . darbepoetin (ARANESP) injection - NON-DIALYSIS  150 mcg Subcutaneous Q Mon-1800  . docusate  100 mg Per Tube BID  . feeding supplement (PRO-STAT SUGAR FREE 64)  60 mL Per Tube TID  . insulin aspart  0-24 Units Subcutaneous Q4H  . insulin detemir  10 Units Subcutaneous BID  . mouth rinse  15 mL Mouth Rinse 10 times per day  . midodrine  10 mg Oral TID WC  . pantoprazole sodium  40 mg Per Tube Daily  . sertraline  25 mg Oral Daily  . sodium chloride flush  10-40 mL Intracatheter Q12H  . sodium chloride flush  10-40 mL Intracatheter Q12H  . trospium  20 mg Per Tube BID    BMET    Component Value Date/Time   NA 139 09/05/2019 0434   NA 141 06/19/2019 1130   K 4.8 09/05/2019 0434   CL 105 09/05/2019 0434   CO2 25 09/05/2019 0434   GLUCOSE 172 (H) 09/05/2019 0434   BUN 34 (H) 09/05/2019 0434   BUN 21 06/19/2019 1130   CREATININE 2.12 (H) 09/05/2019 0434   CALCIUM 7.9 (L) 09/05/2019 0434   GFRNONAA 30 (L) 09/05/2019 0434   GFRAA 35 (L) 09/05/2019 0434   CBC    Component Value Date/Time   WBC 10.9 (H) 09/04/2019 0358   RBC 2.80 (L) 09/04/2019 0358   HGB 8.5 (L) 09/04/2019 0358   HCT 27.4 (L) 09/04/2019 0358   PLT 116 (L) 09/04/2019 0358   MCV 97.9 09/04/2019 0358  MCH 30.4 09/04/2019 0358   MCHC 31.0 09/04/2019 0358   RDW 19.4 (H) 09/04/2019 0358   LYMPHSABS 1.1 09/02/2019 0311   MONOABS 1.5 (H) 09/02/2019 0311   EOSABS 0.3 09/02/2019 0311   BASOSABS 0.1 09/02/2019 0311     Assessment/Plan:  1. Shock- improved now on meripenem and vanc.   2. AKI- (crt 1.22 on 07/24/19) following cardiac cath, cabg, cardiogenic shock, cardiac arrest due to tamponade, and acute on chronic CHF. Started CRRT on 08/24/19 due to anasarca and need for volume removal to close his open chest.  CRRT was held on 09/01/19 with line  changes. 1. Resumed CRRT 09/02/19 and UF as tolerated. 2. Left subclavian trialysis catheter placed by PCCM on 09/01/19 3. Resume 4K/2.5Ca for all replacement fluids.  no heparin  4. UF goal 0-100 ml/hr depending upon BP response 5. With open chest feel like we need to continue CRRT at this time until closure 6. No UOP-  Unclear if recovery is possible given major insult 3. CAD s/p CABG x 4 complicated by cardiac tamponade s/p reopened chest urgently on 08/18/19 with wound vac.most recent OR On 5/10 4. Acute systolic heart failure due to ischemic cardiomyopathy- volume markedly improved with CRRT. Will inc UF some since tolerating and no pressors   5. Postoperative acute hypoxic respiratory failure- reintubated 08/18/19 and likely aspiration. Now trach 6. Anemia-  transfuse prn.added ESA 7. Ileus-  still distended. Per primary. 8. Elytes-  Will give a little phos - k is OK   Louis Meckel  [employer] [phone]

## 2019-09-05 NOTE — Progress Notes (Signed)
Pharmacy Antibiotic Note  Christian [name] is a 74 y.o. male admitted on 08/09/2019 with open chest.  Pharmacy has been previously been consulted for Vancomycin and meropenem dosing, recently added Fluconazole for intra-abdominal coverage of yeast.   Trach aspirate growing Pseudomonas, pt remains on CRRT.  Plan: -Fluconazole 800mg  IV daily - lower reabsorption requiring higher dose on CRRT  -Continue vancomycin 1g q24  -Continue meropenem to 1g q8 hours while on CRRT    Height: 5\' 9"  (175.3 cm) Weight: 85.3 kg (188 lb 0.8 oz) IBW/kg (Calculated) : 70.7  Temp (24hrs), Avg:98.5 F (36.9 C), Min:98 F (36.7 C), Max:98.7 F (37.1 C)  Recent Labs  Lab 08/30/19 0322 08/30/19 2107 08/31/19 0353 08/31/19 1729 09/01/19 0349 09/02/19 0123 09/02/19 0311 09/02/19 1530 09/03/19 0337 09/03/19 1530 09/04/19 0358 09/04/19 1600 09/05/19 0434  WBC   < >  --  27.3*  --  33.6*  --  21.6*  --  16.5*  --  10.9*  --   --   CREATININE   < >  --  2.35*   < > 2.57*   < > 4.33*   < > 2.87* 2.47* 2.24* 2.53* 2.12*  VANCOTROUGH  --  30*  --   --   --   --   --   --   --   --   --   --   --    < > = values in this interval not displayed.    Estimated Creatinine Clearance: 34.1 mL/min (A) (by C-G formula based on SCr of 2.12 mg/dL (H)).    No Known Allergies  Antimicrobials this admission: Vancomycin 4/23 >> Cefepime 4/23 >> 5/7 Meropenem 5/7 >> Fluconazole 5/7 >>  Dose Adjustments: -vanc 2 g x 1 and cefepime 2>1g q24h for open chest 4/23> 2gm q12 for CRRT -VR 20 - redose 1 g 4/25 -VR 22 - no dose on 4/27 -4/29 pre crrt VR  16 >vanc 1250mg  q24 -5/5 VT 30 will hold dose tonight and drop to 1gm q24 start 5/6 pm  Microbiology results: 4/22 BCx x 2 > Neg 5/2 TA: ng>reincubate>nf 5/2 bld x 2: ng4d 5/6 resp cx (TA) > pseudomonas - pan sensitive  Arrie Senate, PharmD, BCPS Clinical Pharmacist [phone] Please check AMION for all [facility] numbers 09/05/2019

## 2019-09-05 NOTE — Progress Notes (Signed)
Patient ID: Christian [name], male   DOB: 09/05/1945, 74 y.o.   MRN: [id number] TCTS DAILY ICU PROGRESS NOTE                   [address]            [place] [address]          [phone]   1 Day Post-Op Procedure(s) (LRB): MEDIASTINAL EXPLORATION WITH STERNAL WOUND IRRIGATION (N/A) Application Of Wound Vac (N/A)  Total Length of Stay:  LOS: 27 days   Subjective: Alert on vent   Objective: Vital signs in last 24 hours: Temp:  [98 F (36.7 C)-98.7 F (37.1 C)] 98.5 F (36.9 C) (05/11 0800) Pulse Rate:  [75-105] 85 (05/11 1040) Cardiac Rhythm: Normal sinus rhythm (05/11 0800) Resp:  [11-28] 28 (05/11 1040) BP: (108-147)/(54-81) 126/68 (05/11 1040) SpO2:  [95 %-100 %] 100 % (05/11 1040) FiO2 (%):  [30 %] 30 % (05/11 1040)  Filed Weights   09/01/19 0451 09/03/19 0421 09/04/19 0454  Weight: 98.5 kg 89 kg 85.3 kg    Weight change:    Hemodynamic parameters for last 24 hours:    Intake/Output from previous day: 05/10 0701 - 05/11 0700 In: 2917.3 [I.V.:920; NG/GT:975; IV Piggyback:1017.3] Out: 3420 [Drains:175; Stool:250; Blood:50; Chest Tube:40]  Intake/Output this shift: Total I/O In: 355.1 [I.V.:10; NG/GT:300; IV Piggyback:45.1] Out: 686 [Drains:100; Other:586]  Current Meds: Scheduled Meds: . sodium chloride   Intravenous Once  . sodium chloride   Intravenous Once  . amiodarone  200 mg Per Tube BID  . aspirin  81 mg Per Tube Daily  . atorvastatin  40 mg Per Tube Daily  . B-complex with vitamin C  1 tablet Per Tube Daily  . bisacodyl  10 mg Oral Daily   Or  . bisacodyl  10 mg Rectal Daily  . chlorhexidine gluconate (MEDLINE KIT)  15 mL Mouth Rinse BID  . Chlorhexidine Gluconate Cloth  6 each Topical Daily  . clopidogrel  75 mg Per Tube Daily  . darbepoetin (ARANESP) injection - NON-DIALYSIS  150 mcg Subcutaneous Q Mon-1800  . docusate  100 mg Per Tube BID  . feeding supplement (PRO-STAT SUGAR FREE 64)  60 mL Per Tube TID  . insulin  aspart  0-24 Units Subcutaneous Q4H  . insulin detemir  10 Units Subcutaneous BID  . mouth rinse  15 mL Mouth Rinse 10 times per day  . midodrine  10 mg Oral TID WC  . pantoprazole sodium  40 mg Per Tube Daily  . sertraline  25 mg Oral Daily  . sodium chloride flush  10-40 mL Intracatheter Q12H  . sodium chloride flush  10-40 mL Intracatheter Q12H  . trospium  20 mg Per Tube BID   Continuous Infusions: .  prismasol BGK 4/2.5 500 mL/hr at 09/04/19 1730  .  prismasol BGK 4/2.5 300 mL/hr at 09/04/19 1800  . sodium chloride Stopped (09/01/19 1008)  . feeding supplement (VITAL 1.5 CAL) 1,000 mL (09/04/19 1130)  . fluconazole (DIFLUCAN) IV Stopped (09/04/19 2349)  . heparin 10,000 units/ 20 mL infusion syringe 1,000 Units/hr (09/05/19 0959)  . lactated ringers 20 mL/hr at 08/20/19 1200  . meropenem (MERREM) IV Stopped (09/05/19 7824)  . norepinephrine (LEVOPHED) Adult infusion Stopped (09/02/19 0647)  . prismasol BGK 4/2.5 1,500 mL/hr at 09/05/19 0959  . sodium phosphate  Dextrose 5% IVPB 43 mL/hr at 09/05/19 1000  . vancomycin Stopped (09/04/19 1804)  . vasopressin (PITRESSIN) infusion - *  FOR SHOCK* Stopped (09/02/19 1634)   PRN Meds:.ALPRAZolam, cromolyn, dextrose, heparin, heparin, hydrALAZINE, HYDROmorphone (DILAUDID) injection, ondansetron (ZOFRAN) IV, oxyCODONE, sodium chloride flush, sodium chloride flush  General appearance: alert Neurologic: intact Heart: regular rate and rhythm, S1, S2 normal, no murmur, click, rub or gallop Wound: wound vac in place  Lab Results: CBC: Recent Labs    09/04/19 0358 09/05/19 0812  WBC 10.9* 12.1*  HGB 8.5* 7.4*  HCT 27.4* 24.1*  PLT 116* 175   BMET:  Recent Labs    09/04/19 1600 09/05/19 0434  NA 137 139  K 5.0 4.8  CL 103 105  CO2 23 25  GLUCOSE 191* 172*  BUN 35* 34*  CREATININE 2.53* 2.12*  CALCIUM 8.1* 7.9*    CMET: Lab Results  Component Value Date   WBC 12.1 (H) 09/05/2019   HGB 7.4 (L) 09/05/2019   HCT 24.1 (L)  09/05/2019   PLT 175 09/05/2019   GLUCOSE 172 (H) 09/05/2019   CHOL 107 08/11/2019   TRIG 71 08/27/2019   HDL 42 08/11/2019   LDLDIRECT 40.0 11/23/2018   LDLCALC 40 08/11/2019   ALT 49 (H) 09/02/2019   AST 38 09/02/2019   NA 139 09/05/2019   K 4.8 09/05/2019   CL 105 09/05/2019   CREATININE 2.12 (H) 09/05/2019   BUN 34 (H) 09/05/2019   CO2 25 09/05/2019   TSH 2.590 04/19/2019   INR 1.3 (H) 08/19/2019   HGBA1C 7.7 (H) 08/14/2019      PT/INR: No results for input(s): LABPROT, INR in the last 72 hours. Radiology: No results found.   Assessment/Plan: S/P Procedure(s) (LRB): MEDIASTINAL EXPLORATION WITH STERNAL WOUND IRRIGATION (N/A) Application Of Wound Vac (N/A)   Discussed with general surgery and plastic surgery combined , harvest of omentum and closure of sternal wound  The goals risks and alternatives of the planned surgical procedure closure of sternum wound with omental  Flap  and poss plating of sternum  have been discussed with the patient in detail. The risks of the procedure including death, infection, stroke, myocardial infarction, bleeding, blood transfusion have all been discussed specifically.  I have quoted Christian [name] a 5 % of perioperative mortality and a complication rate as high as 30  %. The patient's questions have been answered.Christian [name] is willing  to proceed with the planned procedure.      Grace Manny 09/05/2019 11:03 AM

## 2019-09-05 NOTE — Consult Note (Signed)
[facility]  Reason for Consult: Sternal Wound Referring Physician: Dr. Maylon Peppers Christian [name] is an 74 y.o. male.  HPI: Patient is a 74 year old male who presented to the ED for chest pain on 08/09/2019.  Patient has a past medical history of CAD, HTN, hyperlipidemia, DM.  He reported to cardiology that his symptoms of chest pain and dyspnea had gotten worse.  On evaluation in the ED on 414 patient was noted to have a right lower lobe pulmonary embolism and EKG with nonspecific ST T wave changes.  Patient was admitted by cardiology for further evaluation and taken to the Cath Lab on 08/10/2019.  Patient was found to have CAD and it was recommended that patient undergo coronary bypass grafting.  Patient underwent coronary artery bypass grafting x4 on 08/15/2019.  Patient had cardiac arrest on 08/18/2019 and patient urgently underwent open chest.  Patient subsequently underwent chest washout on 08/21/2019 and 08/24/2019.  Patient subsequently went additional washout, tracheostomy and wound VAC change on 08/29/2019.  Most recently patient underwent sternal wound exploration and irrigation and application of wound VAC on 09/04/2019.  Plastic surgery was consulted for assistance with closure of sternal wound.  On evaluation today patient is alert and oriented, difficulty speaking due to tracheostomy.  Nursing staff at bedside.  Past Medical History:  Diagnosis Date  . BPH (benign prostatic hypertrophy)   . Coronary artery disease   . Diabetes mellitus without complication ([place])   . Dry eyes left  . Hiatal hernia   . 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  LIMA TO LAD, RIMA TO PDA, RADIAL ARTERY TO CIRC AND SEQUENTIALLY TO OM1. 08/15/2019  . Hyperlipidemia   . Hypertension   . Incomplete bladder emptying   . Nocturia   . 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 SOB (shortness of breath) on exertion     Past Surgical History:  Procedure Laterality Date  . APPLICATION OF WOUND VAC N/A 08/24/2019   Procedure: APPLICATION OF WOUND VAC;  Surgeon: [name], MD;  Location: [place];  Service: Thoracic;  Laterality: N/A;  . APPLICATION OF WOUND VAC  08/29/2019   Procedure: Wound Vac change;  Surgeon: [name], MD;  Location: [place] OR;  Service: Open Heart Surgery;;  . APPLICATION OF WOUND VAC N/A 09/04/2019   Procedure: Application Of Wound Vac;  Surgeon: Grace Clois, MD;  Location: [place];  Service: Open Heart Surgery;  Laterality: N/A;  . CARDIAC CATHETERIZATION    . CORONARY ARTERY BYPASS GRAFT N/A 08/15/2019   Procedure: CORONARY ARTERY BYPASS GRAFTING (CABG), x 4 using bilateral IMAs and left radial artery .  LIMA TO LAD, RIMA TO PDA, RADIAL ARTERY TO CIRC AND SEQUENTIALLY TO OM1.;  Surgeon: [name], MD;  Location: [place];  Service: Open Heart Surgery;  Laterality: N/A;  . CORONARY STENT PLACEMENT  02/27/2014   distal rt/pd coronary       dr Einar Gip  . CYSTO/ BLADDER BIOPSY'S/ CAUTHERIZATION  01-14-2004  DR Gaynelle Arabian  . EXPLORATION POST OPERATIVE OPEN HEART N/A 08/16/2019   Procedure: Chest Closure S?P CABG WITH APPLICATION OF PREVENA  INCISIONAL WOUND VAC;  Surgeon: [name], MD;  Location[facility];  Service: Open Heart Surgery;  Laterality: N/A;  . EXPLORATION POST OPERATIVE OPEN HEART N/A 08/21/2019   Procedure:  CHEST WASHOUT S/P OPEN CHEST;  Surgeon: [name], MD;  Location: [place];  Service: Open Heart Surgery;  Laterality: N/A;  Open chest with Esmark dressing with Ioban sealant coverage.  . EXPLORATION POST OPERATIVE OPEN HEART N/A 08/18/2019   Procedure: EXPLORATION POST OPERATIVE OPEN HEART (performed 04/23 on unit);  Surgeon: [name], MD;  Location: [place];  Service: Open Heart Surgery;  Laterality: N/A;  . EXPLORATION POST OPERATIVE OPEN HEART N/A 08/24/2019   Procedure: CHEST  WASHOUT POST OPERATIVE OPEN HEART;  Surgeon: [name], MD;  Location: [place];  Service: Open Heart Surgery;  Laterality: N/A;  . EXPLORATION POST OPERATIVE OPEN HEART N/A 08/29/2019   Procedure: CHEST WOUND WASHOUT POST OPERATIVE OPEN HEART;  Surgeon: [name], MD;  Location: [place];  Service: Open Heart Surgery;  Laterality: N/A;  . EXPLORATION POST OPERATIVE OPEN HEART N/A 09/04/2019   Procedure: MEDIASTINAL EXPLORATION WITH STERNAL WOUND IRRIGATION;  Surgeon: Grace Iain, MD;  Location: [place];  Service: Open Heart Surgery;  Laterality: N/A;  . LEFT HEART CATH AND CORONARY ANGIOGRAPHY N/A 08/10/2019   Procedure: LEFT HEART CATH AND CORONARY ANGIOGRAPHY;  Surgeon: Nigel Mormon, MD;  Location: [place] CV LAB;  Service: Cardiovascular;  Laterality: N/A;  . LEFT HEART CATHETERIZATION WITH CORONARY ANGIOGRAM N/A 02/27/2014   Procedure: LEFT HEART CATHETERIZATION WITH CORONARY ANGIOGRAM;  Surgeon: Laverda Page, MD;  Location: [facility] C[facility] LAB;  Service: Cardiovascular;  Laterality: N/A;  . PERCUTANEOUS CORONARY STENT INTERVENTION (PCI-S)  02/27/2014   Procedure: PERCUTANEOUS CORONARY STENT INTERVENTION (PCI-S);  Surgeon: Laverda Page, MD;  Location: [facility] CATH LAB;  Service: Cardiovascular;;  rt PDA  3.0/28mm Promus stent  . RADIAL ARTERY HARVEST Left 08/15/2019   Procedure: Radial Artery Harvest;  Surgeon: [name], MD;  Location: [place];  Service: Open Heart Surgery;  Laterality: Left;  . TEE WITHOUT CARDIOVERSION N/A 08/15/2019   Procedure: TRANSESOPHAGEAL ECHOCARDIOGRAM (TEE);  Surgeon: [name], MD;  Location: [place];  Service: Open Heart Surgery;  Laterality: N/A;  . TRACHEOSTOMY TUBE PLACEMENT  08/29/2019   Procedure: Tracheostomy;  Surgeon: [name], MD;  Location: [place] OR;  Service: Open Heart Surgery;;  . TRANSURETHRAL RESECTION OF PROSTATE  04/04/2012   Procedure: TRANSURETHRAL RESECTION OF THE PROSTATE WITH GYRUS INSTRUMENTS;  Surgeon:  Ailene Rud, MD;  Location: [place];  Service: Urology;  Laterality: N/A;  . TRANSURETHRAL RESECTION OF PROSTATE N/A 09/27/2014   Procedure: TRANSURETHRAL RESECTION OF THE PROSTATE ;  Surgeon: Carolan Clines, MD;  Location: [facility];  Service: Urology;  Laterality: N/A;  . UPPER GASTROINTESTINAL ENDOSCOPY      Family History  Problem Relation Age of Onset  . Diabetes Mother   . Hypertension Mother   . Diabetes Father   . Diabetes Brother   . Hypertension Brother   . Diabetes Brother     Social History:  reports that he quit smoking about 29 years ago. His smoking use included cigarettes. He has a 10.00 pack-year smoking history. He has never used smokeless tobacco. He reports current alcohol use. He reports that he does not use drugs.  Allergies: No Known Allergies  Medications:  I have reviewed the patient's current medications. Scheduled: . sodium chloride   Intravenous Once  . sodium chloride   Intravenous Once  . amiodarone  200 mg Per Tube BID  . aspirin  81 mg Per Tube Daily  . atorvastatin  40 mg  Per Tube Daily  . B-complex with vitamin C  1 tablet Per Tube Daily  . bisacodyl  10 mg Oral Daily   Or  . bisacodyl  10 mg Rectal Daily  . chlorhexidine gluconate (MEDLINE KIT)  15 mL Mouth Rinse BID  . Chlorhexidine Gluconate Cloth  6 each Topical Daily  . clopidogrel  75 mg Per Tube Daily  . darbepoetin (ARANESP) injection - NON-DIALYSIS  150 mcg Subcutaneous Q Mon-1800  . docusate  100 mg Per Tube BID  . feeding supplement (PRO-STAT SUGAR FREE 64)  60 mL Per Tube TID  . insulin aspart  0-24 Units Subcutaneous Q4H  . insulin detemir  10 Units Subcutaneous BID  . mouth rinse  15 mL Mouth Rinse 10 times per day  . midodrine  10 mg Oral TID WC  . pantoprazole sodium  40 mg Per Tube Daily  . sertraline  25 mg Oral Daily  . sodium chloride flush  10-40 mL Intracatheter Q12H  . sodium chloride flush  10-40 mL Intracatheter Q12H  . trospium  20 mg  Per Tube BID   Continuous: .  prismasol BGK 4/2.5 500 mL/hr at 09/04/19 1730  .  prismasol BGK 4/2.5 300 mL/hr at 09/04/19 1800  . sodium chloride Stopped (09/01/19 1008)  . feeding supplement (VITAL 1.5 CAL) 50 mL/hr at 09/05/19 1300  . fluconazole (DIFLUCAN) IV Stopped (09/04/19 2349)  . heparin 10,000 units/ 20 mL infusion syringe 1,000 Units/hr (09/05/19 0959)  . lactated ringers 20 mL/hr at 08/20/19 1200  . meropenem (MERREM) IV Stopped (09/05/19 3557)  . norepinephrine (LEVOPHED) Adult infusion Stopped (09/02/19 0647)  . prismasol BGK 4/2.5 1,500 mL/hr at 09/05/19 1313  . sodium phosphate  Dextrose 5% IVPB 43 mL/hr at 09/05/19 1300  . vancomycin Stopped (09/04/19 1804)  . vasopressin (PITRESSIN) infusion - *FOR SHOCK* Stopped (09/02/19 1634)    Results for orders placed or performed during the hospital encounter of 08/09/19 (from the past 48 hour(s))  Renal function panel (daily at 1600)     Status: Abnormal   Collection Time: 09/03/19  3:30 PM  Result Value Ref Range   Sodium 139 135 - 145 mmol/L   Potassium 4.2 3.5 - 5.1 mmol/L   Chloride 104 98 - 111 mmol/L   CO2 25 22 - 32 mmol/L   Glucose, Bld 135 (H) 70 - 99 mg/dL    Comment: Glucose reference range applies only to samples taken after fasting for at least 8 hours.   BUN 40 (H) 8 - 23 mg/dL   Creatinine, Ser 2.47 (H) 0.61 - 1.24 mg/dL   Calcium 8.0 (L) 8.9 - 10.3 mg/dL   Phosphorus 2.3 (L) 2.5 - 4.6 mg/dL   Albumin 2.1 (L) 3.5 - 5.0 g/dL   GFR calc non Af Amer 25 (L) >60 mL/min   GFR calc Af Amer 29 (L) >60 mL/min   Anion gap 10 5 - 15    Comment: Performed at [facility] [address], [place], [place] [address]  Type and screen     Status: None   Collection Time: 09/03/19  3:30 PM  Result Value Ref Range   ABO/RH(D) O POS    Antibody Screen NEG    Sample Expiration      09/06/2019,2359 Performed at [facility], [place] [address] [place], [place] [address]   Glucose, capillary     Status:  Abnormal   Collection Time: 09/03/19  3:32 PM  Result Value Ref Range  Glucose-Capillary 138 (H) 70 - 99 mg/dL    Comment: Glucose reference range applies only to samples taken after fasting for at least 8 hours.  Glucose, capillary     Status: Abnormal   Collection Time: 09/03/19  4:15 PM  Result Value Ref Range   Glucose-Capillary 126 (H) 70 - 99 mg/dL    Comment: Glucose reference range applies only to samples taken after fasting for at least 8 hours.  Glucose, capillary     Status: Abnormal   Collection Time: 09/03/19  7:42 PM  Result Value Ref Range   Glucose-Capillary 159 (H) 70 - 99 mg/dL    Comment: Glucose reference range applies only to samples taken after fasting for at least 8 hours.  Glucose, capillary     Status: Abnormal   Collection Time: 09/04/19 12:16 AM  Result Value Ref Range   Glucose-Capillary 176 (H) 70 - 99 mg/dL    Comment: Glucose reference range applies only to samples taken after fasting for at least 8 hours.  Renal function panel (daily at 0500)     Status: Abnormal   Collection Time: 09/04/19  3:58 AM  Result Value Ref Range   Sodium 139 135 - 145 mmol/L   Potassium 4.4 3.5 - 5.1 mmol/L   Chloride 103 98 - 111 mmol/L   CO2 25 22 - 32 mmol/L   Glucose, Bld 82 70 - 99 mg/dL    Comment: Glucose reference range applies only to samples taken after fasting for at least 8 hours.   BUN 32 (H) 8 - 23 mg/dL   Creatinine, Ser 2.24 (H) 0.61 - 1.24 mg/dL   Calcium 8.0 (L) 8.9 - 10.3 mg/dL   Phosphorus 2.9 2.5 - 4.6 mg/dL   Albumin 2.0 (L) 3.5 - 5.0 g/dL   GFR calc non Af Amer 28 (L) >60 mL/min   GFR calc Af Amer 33 (L) >60 mL/min   Anion gap 11 5 - 15    Comment: Performed at [facility] [address], [place], [place] [address]  Magnesium     Status: Abnormal   Collection Time: 09/04/19  3:58 AM  Result Value Ref Range   Magnesium 2.5 (H) 1.7 - 2.4 mg/dL    Comment: Performed at [facility] [address] [place], [place] [address]  APTT      Status: None   Collection Time: 09/04/19  3:58 AM  Result Value Ref Range   aPTT 30 24 - 36 seconds    Comment: Performed at [facility] [address], [place], [place] [address]  CBC     Status: Abnormal   Collection Time: 09/04/19  3:58 AM  Result Value Ref Range   WBC 10.9 (H) 4.0 - 10.5 K/uL   RBC 2.80 (L) 4.22 - 5.81 MIL/uL   Hemoglobin 8.5 (L) 13.0 - 17.0 g/dL   HCT 27.4 (L) 39.0 - 52.0 %   MCV 97.9 80.0 - 100.0 fL   MCH 30.4 26.0 - 34.0 pg   MCHC 31.0 30.0 - 36.0 g/dL   RDW 19.4 (H) 11.5 - 15.5 %   Platelets 116 (L) 150 - 400 K/uL    Comment: SPECIMEN CHECKED FOR CLOTS Immature Platelet Fraction may be clinically indicated, consider ordering this additional test [id number] PLATELET COUNT CONFIRMED BY SMEAR REPEATED TO VERIFY    nRBC 1.6 (H) 0.0 - 0.2 %    Comment: Performed at [facility], [place] [address], [place], [place]  [address]  Glucose, capillary     Status: None   Collection Time: 09/04/19  3:58 AM  Result Value Ref Range   Glucose-Capillary 83 70 - 99 mg/dL    Comment: Glucose reference range applies only to samples taken after fasting for at least 8 hours.  POCT Activated clotting time     Status: None   Collection Time: 09/04/19  4:44 AM  Result Value Ref Range   Activated Clotting Time 153 seconds  Glucose, capillary     Status: None   Collection Time: 09/04/19  5:32 AM  Result Value Ref Range   Glucose-Capillary 84 70 - 99 mg/dL    Comment: Glucose reference range applies only to samples taken after fasting for at least 8 hours.  Aerobic Culture (superficial specimen)     Status: None (Preliminary result)   Collection Time: 09/04/19  8:22 AM   Specimen: Wound  Result Value Ref Range   Specimen Description WOUND    Special Requests STERNAL WOUND SPEC A    Gram Stain NO WBC SEEN NO ORGANISMS SEEN     Culture      NO GROWTH 1 DAY Performed at [facility], [place] [address] [place], [place] [address]    Report Status PENDING    Glucose, capillary     Status: Abnormal   Collection Time: 09/04/19 11:32 AM  Result Value Ref Range   Glucose-Capillary 143 (H) 70 - 99 mg/dL    Comment: Glucose reference range applies only to samples taken after fasting for at least 8 hours.  Glucose, capillary     Status: Abnormal   Collection Time: 09/04/19  3:20 PM  Result Value Ref Range   Glucose-Capillary 180 (H) 70 - 99 mg/dL    Comment: Glucose reference range applies only to samples taken after fasting for at least 8 hours.  Renal function panel (daily at 1600)     Status: Abnormal   Collection Time: 09/04/19  4:00 PM  Result Value Ref Range   Sodium 137 135 - 145 mmol/L   Potassium 5.0 3.5 - 5.1 mmol/L   Chloride 103 98 - 111 mmol/L   CO2 23 22 - 32 mmol/L   Glucose, Bld 191 (H) 70 - 99 mg/dL    Comment: Glucose reference range applies only to samples taken after fasting for at least 8 hours.   BUN 35 (H) 8 - 23 mg/dL   Creatinine, Ser 2.53 (H) 0.61 - 1.24 mg/dL   Calcium 8.1 (L) 8.9 - 10.3 mg/dL   Phosphorus 3.3 2.5 - 4.6 mg/dL   Albumin 2.3 (L) 3.5 - 5.0 g/dL   GFR calc non Af Amer 24 (L) >60 mL/min   GFR calc Af Amer 28 (L) >60 mL/min   Anion gap 11 5 - 15    Comment: Performed at [facility] [address], [place], [place] [address]  Glucose, capillary     Status: Abnormal   Collection Time: 09/04/19  7:51 PM  Result Value Ref Range   Glucose-Capillary 152 (H) 70 - 99 mg/dL    Comment: Glucose reference range applies only to samples taken after fasting for at least 8 hours.  Glucose, capillary     Status: Abnormal   Collection Time: 09/04/19 11:46 PM  Result Value Ref Range   Glucose-Capillary 163 (H) 70 - 99 mg/dL    Comment: Glucose reference range applies only to samples taken after fasting for at least 8 hours.  Glucose, capillary  Status: Abnormal   Collection Time: 09/05/19  4:25 AM  Result Value Ref Range   Glucose-Capillary 162 (H) 70 - 99 mg/dL    Comment: Glucose reference range  applies only to samples taken after fasting for at least 8 hours.  Magnesium     Status: None   Collection Time: 09/05/19  4:34 AM  Result Value Ref Range   Magnesium 2.4 1.7 - 2.4 mg/dL    Comment: Performed at [facility] [address] [place], [place] [address]  APTT     Status: Abnormal   Collection Time: 09/05/19  4:34 AM  Result Value Ref Range   aPTT 99 (H) 24 - 36 seconds    Comment:        IF BASELINE aPTT IS ELEVATED, SUGGEST PATIENT RISK ASSESSMENT BE USED TO DETERMINE APPROPRIATE ANTICOAGULANT THERAPY. Performed at [facility], [place] [address] [place], [place] [address]   Renal function panel     Status: Abnormal   Collection Time: 09/05/19  4:34 AM  Result Value Ref Range   Sodium 139 135 - 145 mmol/L   Potassium 4.8 3.5 - 5.1 mmol/L   Chloride 105 98 - 111 mmol/L   CO2 25 22 - 32 mmol/L   Glucose, Bld 172 (H) 70 - 99 mg/dL    Comment: Glucose reference range applies only to samples taken after fasting for at least 8 hours.   BUN 34 (H) 8 - 23 mg/dL   Creatinine, Ser 2.12 (H) 0.61 - 1.24 mg/dL   Calcium 7.9 (L) 8.9 - 10.3 mg/dL   Phosphorus 2.3 (L) 2.5 - 4.6 mg/dL   Albumin 2.2 (L) 3.5 - 5.0 g/dL   GFR calc non Af Amer 30 (L) >60 mL/min   GFR calc Af Amer 35 (L) >60 mL/min   Anion gap 9 5 - 15    Comment: Performed at [facility] [address] [place], [place] [address]  CBC     Status: Abnormal   Collection Time: 09/05/19  8:12 AM  Result Value Ref Range   WBC 12.1 (H) 4.0 - 10.5 K/uL   RBC 2.47 (L) 4.22 - 5.81 MIL/uL   Hemoglobin 7.4 (L) 13.0 - 17.0 g/dL   HCT 24.1 (L) 39.0 - 52.0 %   MCV 97.6 80.0 - 100.0 fL   MCH 30.0 26.0 - 34.0 pg   MCHC 30.7 30.0 - 36.0 g/dL   RDW 20.2 (H) 11.5 - 15.5 %   Platelets 175 150 - 400 K/uL   nRBC 0.2 0.0 - 0.2 %    Comment: Performed at [facility], [place] [address] [place], [place] [address]  Glucose, capillary     Status: Abnormal   Collection Time: 09/05/19  8:35 AM  Result Value Ref  Range   Glucose-Capillary 163 (H) 70 - 99 mg/dL    Comment: Glucose reference range applies only to samples taken after fasting for at least 8 hours.  Glucose, capillary     Status: Abnormal   Collection Time: 09/05/19 11:50 AM  Result Value Ref Range   Glucose-Capillary 146 (H) 70 - 99 mg/dL    Comment: Glucose reference range applies only to samples taken after fasting for at least 8 hours.    DG Chest Port 1 View  Result Date: 09/04/2019 CLINICAL DATA:  Chest tube EXAM: PORTABLE CHEST 1 VIEW COMPARISON:  09/03/2019 FINDINGS: Support devices including right chest tube remain in place, unchanged. No pneumothorax. Left lower  lobe airspace opacity and nodular density in the right mid lung are unchanged. Heart is borderline in size. No visible effusions or acute bony abnormality. IMPRESSION: No significant change since prior study. Electronically Signed   By: Rolm Baptise M.D.   On: 09/04/2019 08:12    Review of Systems  Constitutional: Negative.   Cardiovascular: Negative.   Gastrointestinal: Negative.    Blood pressure 135/66, pulse 76, temperature 98.5 F (36.9 C), resp. rate (!) 21, height 5' 9" (1.753 m), weight 85.3 kg, SpO2 100 %. Physical Exam  Constitutional: No distress.  Ill-appearing elderly male resting in bed  HENT:  Head: Normocephalic and atraumatic.  NG tube in place, HD line in place  Respiratory:    Tracheostomy tube in place. Chest tube in place   Neurological: He is alert.  Unable to verbalize due to tracheostomy tube   Skin: He is not diaphoretic.    Assessment/Plan:  Patient is a 74 year old male with large wound of sternum, plan for possible left pectoralis turnover flap, possible placement of ACell, possible placement of wound VAC on 09/06/2019 with Dr. Marla Roe.  Surgery is coordinated with Dr. Servando Snare and Dr. Kieth Brightly. Surgical plan also to involves possible closure of sternum wound via omental flap and possible plating of sternum.  Appreciate  the consult and the opportunity to participate in patient's care. Please call with any questions or concerns.  Carola Rhine Shedric Fredericks, PA-C 09/05/2019, 1:32 PM

## 2019-09-06 ENCOUNTER — Inpatient Hospital Stay (HOSPITAL_COMMUNITY): Payer: Medicare Other

## 2019-09-06 ENCOUNTER — Encounter (HOSPITAL_COMMUNITY): Payer: Self-pay | Admitting: Cardiothoracic Surgery

## 2019-09-06 ENCOUNTER — Inpatient Hospital Stay (HOSPITAL_COMMUNITY): Payer: Medicare Other | Admitting: Certified Registered Nurse Anesthetist

## 2019-09-06 ENCOUNTER — Encounter (HOSPITAL_COMMUNITY)
Admission: EM | Disposition: A | Discharge status: Skilled Nursing Facility | Payer: Self-pay | Source: Home / Self Care | Attending: Cardiothoracic Surgery

## 2019-09-06 DIAGNOSIS — I9789 Other postprocedural complications and disorders of the circulatory system, not elsewhere classified: Secondary | ICD-10-CM

## 2019-09-06 HISTORY — PX: LAPAROSCOPIC LYSIS OF ADHESIONS: SHX5905

## 2019-09-06 HISTORY — PX: PECTORALIS FLAP: SHX6228

## 2019-09-06 HISTORY — PX: RIB PLATING: SHX5079

## 2019-09-06 HISTORY — PX: MEDIASTINAL EXPLORATION: SHX5065

## 2019-09-06 HISTORY — PX: APPLICATION OF WOUND VAC: SHX5189

## 2019-09-06 LAB — AEROBIC CULTURE W GRAM STAIN (SUPERFICIAL SPECIMEN)
Culture: NO GROWTH
Gram Stain: NONE SEEN

## 2019-09-06 LAB — RENAL FUNCTION PANEL
Albumin: 2.4 g/dL — ABNORMAL LOW (ref 3.5–5.0)
Albumin: 2.3 g/dL — ABNORMAL LOW (ref 3.5–5.0)
Anion gap: 10 (ref 5–15)
Anion gap: 9 (ref 5–15)
BUN: 28 mg/dL — ABNORMAL HIGH (ref 8–23)
BUN: 32 mg/dL — ABNORMAL HIGH (ref 8–23)
CO2: 26 mmol/L (ref 22–32)
CO2: 24 mmol/L (ref 22–32)
Calcium: 8.5 mg/dL — ABNORMAL LOW (ref 8.9–10.3)
Calcium: 8.6 mg/dL — ABNORMAL LOW (ref 8.9–10.3)
Chloride: 102 mmol/L (ref 98–111)
Chloride: 105 mmol/L (ref 98–111)
Creatinine, Ser: 1.92 mg/dL — ABNORMAL HIGH (ref 0.61–1.24)
Creatinine, Ser: 2.6 mg/dL — ABNORMAL HIGH (ref 0.61–1.24)
GFR calc Af Amer: 39 mL/min — ABNORMAL LOW (ref >60)
GFR calc Af Amer: 27 mL/min — ABNORMAL LOW (ref >60)
GFR calc non Af Amer: 34 mL/min — ABNORMAL LOW (ref >60)
GFR calc non Af Amer: 24 mL/min — ABNORMAL LOW (ref >60)
Glucose, Bld: 155 mg/dL — ABNORMAL HIGH (ref 70–99)
Glucose, Bld: 127 mg/dL — ABNORMAL HIGH (ref 70–99)
Phosphorus: 2.7 mg/dL (ref 2.5–4.6)
Phosphorus: 4.5 mg/dL (ref 2.5–4.6)
Potassium: 4.5 mmol/L (ref 3.5–5.1)
Potassium: 5.1 mmol/L (ref 3.5–5.1)
Sodium: 138 mmol/L (ref 135–145)
Sodium: 138 mmol/L (ref 135–145)

## 2019-09-06 LAB — CBC
HCT: 28 % — ABNORMAL LOW (ref 39.0–52.0)
HCT: 27.7 % — ABNORMAL LOW (ref 39.0–52.0)
Hemoglobin: 8.7 g/dL — ABNORMAL LOW (ref 13.0–17.0)
Hemoglobin: 8.6 g/dL — ABNORMAL LOW (ref 13.0–17.0)
MCH: 30.5 pg (ref 26.0–34.0)
MCH: 30.6 pg (ref 26.0–34.0)
MCHC: 31.1 g/dL (ref 30.0–36.0)
MCHC: 31 g/dL (ref 30.0–36.0)
MCV: 98.2 fL (ref 80.0–100.0)
MCV: 98.6 fL (ref 80.0–100.0)
Platelets: 200 10*3/uL (ref 150–400)
Platelets: 207 10*3/uL (ref 150–400)
RBC: 2.85 MIL/uL — ABNORMAL LOW (ref 4.22–5.81)
RBC: 2.81 MIL/uL — ABNORMAL LOW (ref 4.22–5.81)
RDW: 20.6 % — ABNORMAL HIGH (ref 11.5–15.5)
RDW: 21.2 % — ABNORMAL HIGH (ref 11.5–15.5)
WBC: 12.4 10*3/uL — ABNORMAL HIGH (ref 4.0–10.5)
WBC: 14.2 10*3/uL — ABNORMAL HIGH (ref 4.0–10.5)
nRBC: 0.2 % (ref 0.0–0.2)
nRBC: 0.1 % (ref 0.0–0.2)

## 2019-09-06 LAB — GLUCOSE, CAPILLARY
Glucose-Capillary: 136 mg/dL — ABNORMAL HIGH (ref 70–99)
Glucose-Capillary: 120 mg/dL — ABNORMAL HIGH (ref 70–99)
Glucose-Capillary: 136 mg/dL — ABNORMAL HIGH (ref 70–99)
Glucose-Capillary: 125 mg/dL — ABNORMAL HIGH (ref 70–99)

## 2019-09-06 LAB — APTT: aPTT: 90 seconds — ABNORMAL HIGH (ref 24–36)

## 2019-09-06 LAB — MAGNESIUM: Magnesium: 2.5 mg/dL — ABNORMAL HIGH (ref 1.7–2.4)

## 2019-09-06 SURGERY — EXPLORATION, MEDIASTINUM
Anesthesia: General

## 2019-09-06 MED ORDER — CHLORHEXIDINE GLUCONATE CLOTH 2 % EX PADS
6.0000 | MEDICATED_PAD | Freq: Once | CUTANEOUS | Status: AC
  Administered 2019-09-06: 6 via TOPICAL

## 2019-09-06 MED ORDER — MIDAZOLAM HCL 5 MG/5ML IJ SOLN
INTRAMUSCULAR | Status: DC | PRN
  Administered 2019-09-06: 2 mg via INTRAVENOUS

## 2019-09-06 MED ORDER — FENTANYL CITRATE (PF) 250 MCG/5ML IJ SOLN
INTRAMUSCULAR | Status: AC
  Filled 2019-09-06: qty 5

## 2019-09-06 MED ORDER — PHENYLEPHRINE 40 MCG/ML (10ML) SYRINGE FOR IV PUSH (FOR BLOOD PRESSURE SUPPORT)
PREFILLED_SYRINGE | INTRAVENOUS | Status: DC | PRN
  Administered 2019-09-06: 80 ug via INTRAVENOUS
  Administered 2019-09-06: 40 ug via INTRAVENOUS
  Administered 2019-09-06: 80 ug via INTRAVENOUS
  Administered 2019-09-06: 40 ug via INTRAVENOUS
  Administered 2019-09-06 (×2): 80 ug via INTRAVENOUS

## 2019-09-06 MED ORDER — VANCOMYCIN VARIABLE DOSE PER UNSTABLE RENAL FUNCTION (PHARMACIST DOSING)
Status: AC
  Filled 2019-09-06: qty 1

## 2019-09-06 MED ORDER — CEFAZOLIN SODIUM-DEXTROSE 2-4 GM/100ML-% IV SOLN: 2.0000 g | INTRAVENOUS | Status: DC

## 2019-09-06 MED ORDER — SUGAMMADEX SODIUM 200 MG/2ML IV SOLN
INTRAVENOUS | Status: DC | PRN
Start: 2019-09-06 — End: 2019-09-06
  Administered 2019-09-06: 200 mg via INTRAVENOUS

## 2019-09-06 MED ORDER — ROCURONIUM BROMIDE 10 MG/ML (PF) SYRINGE
PREFILLED_SYRINGE | INTRAVENOUS | Status: DC | PRN
  Administered 2019-09-06: 40 mg via INTRAVENOUS
  Administered 2019-09-06: 60 mg via INTRAVENOUS
  Administered 2019-09-06 (×2): 40 mg via INTRAVENOUS
  Administered 2019-09-06: 60 mg via INTRAVENOUS

## 2019-09-06 MED ORDER — FLUCONAZOLE IN SODIUM CHLORIDE 400-0.9 MG/200ML-% IV SOLN
400.0000 mg | INTRAVENOUS | Status: AC
  Administered 2019-09-06 – 2019-09-08 (×3): 400 mg via INTRAVENOUS
  Filled 2019-09-06 (×3): qty 200

## 2019-09-06 MED ORDER — PHENYLEPHRINE HCL-NACL 10-0.9 MG/250ML-% IV SOLN
INTRAVENOUS | Status: DC | PRN
  Administered 2019-09-06: 25 ug/min via INTRAVENOUS

## 2019-09-06 MED ORDER — PHENYLEPHRINE 40 MCG/ML (10ML) SYRINGE FOR IV PUSH (FOR BLOOD PRESSURE SUPPORT)
PREFILLED_SYRINGE | INTRAVENOUS | Status: AC
  Filled 2019-09-06: qty 10

## 2019-09-06 MED ORDER — CEFAZOLIN SODIUM-DEXTROSE 2-3 GM-%(50ML) IV SOLR
INTRAVENOUS | Status: DC | PRN
Start: 2019-09-06 — End: 2019-09-06
  Administered 2019-09-06: 2 g via INTRAVENOUS

## 2019-09-06 MED ORDER — ONDANSETRON HCL 4 MG/2ML IJ SOLN
INTRAMUSCULAR | Status: DC | PRN
  Administered 2019-09-06: 4 mg via INTRAVENOUS

## 2019-09-06 MED ORDER — SODIUM CHLORIDE 0.9 % IV SOLN
500.0000 mg | INTRAVENOUS | Status: AC
  Administered 2019-09-06 – 2019-09-08 (×3): 500 mg via INTRAVENOUS
  Filled 2019-09-06 (×5): qty 0.5

## 2019-09-06 MED ORDER — PROPOFOL 10 MG/ML IV BOLUS
INTRAVENOUS | Status: DC | PRN
  Administered 2019-09-06: 50 mg via INTRAVENOUS

## 2019-09-06 MED ORDER — FENTANYL CITRATE (PF) 100 MCG/2ML IJ SOLN
INTRAMUSCULAR | Status: DC | PRN
  Administered 2019-09-06 (×2): 50 ug via INTRAVENOUS
  Administered 2019-09-06: 150 ug via INTRAVENOUS
  Administered 2019-09-06: 50 ug via INTRAVENOUS

## 2019-09-06 MED ORDER — PHENYLEPHRINE HCL (PRESSORS) 10 MG/ML IV SOLN
INTRAVENOUS | Status: AC
  Filled 2019-09-06: qty 1

## 2019-09-06 MED ORDER — ONDANSETRON HCL 4 MG/2ML IJ SOLN
INTRAMUSCULAR | Status: AC
  Filled 2019-09-06: qty 2

## 2019-09-06 MED ORDER — VANCOMYCIN HCL 750 MG/150ML IV SOLN
750.0000 mg | Freq: Once | INTRAVENOUS | Status: AC
  Administered 2019-09-06: 750 mg via INTRAVENOUS
  Filled 2019-09-06: qty 150

## 2019-09-06 MED ORDER — ROCURONIUM BROMIDE 10 MG/ML (PF) SYRINGE
PREFILLED_SYRINGE | INTRAVENOUS | Status: AC
  Filled 2019-09-06: qty 20

## 2019-09-06 MED ORDER — MIDAZOLAM HCL 2 MG/2ML IJ SOLN
INTRAMUSCULAR | Status: AC
  Filled 2019-09-06: qty 2

## 2019-09-06 MED ORDER — 0.9 % SODIUM CHLORIDE (POUR BTL) OPTIME
TOPICAL | Status: DC | PRN
  Administered 2019-09-06: 2000 mL

## 2019-09-06 MED ORDER — LACTATED RINGERS IV SOLN: INTRAVENOUS | Status: DC | PRN

## 2019-09-06 MED ORDER — PROPOFOL 10 MG/ML IV BOLUS
INTRAVENOUS | Status: AC
  Filled 2019-09-06: qty 20

## 2019-09-06 SURGICAL SUPPLY — 135 items
APPLIER CLIP 9.375 MED OPEN (MISCELLANEOUS)
ATCH SMKEVC FLXB CAUT HNDSWH (FILTER) ×2 IMPLANT
BAG DECANTER FOR FLEXI CONT (MISCELLANEOUS) ×2 IMPLANT
BATTERY MAXDRIVER (MISCELLANEOUS) ×1 IMPLANT
BATTERY PACK STR FOR DRIVER (MISCELLANEOUS) ×2 IMPLANT
BENZOIN TINCTURE PRP APPL 2/3 (GAUZE/BANDAGES/DRESSINGS) IMPLANT
BIOPATCH RED 1 DISK 7.0 (GAUZE/BANDAGES/DRESSINGS) ×2 IMPLANT
BLADE CLIPPER SURG (BLADE) ×2 IMPLANT
BLADE SURG 15 STRL LF DISP TIS (BLADE) ×2 IMPLANT
BLADE SURG 15 STRL SS (BLADE) ×6
BLADE SURG SZ11 CARB STEEL (BLADE) ×1 IMPLANT
CANISTER SUCT 3000ML PPV (MISCELLANEOUS) ×5 IMPLANT
CANISTER WOUND CARE 500ML ATS (WOUND CARE) ×1 IMPLANT
CATH HYDRAGLIDE XL THORACIC (CATHETERS) IMPLANT
CATH THORACIC 28FR (CATHETERS) IMPLANT
CATH THORACIC 28FR RT ANG (CATHETERS) IMPLANT
CATH THORACIC 36FR (CATHETERS) IMPLANT
CATH THORACIC 36FR RT ANG (CATHETERS) IMPLANT
CHLORAPREP W/TINT 26 (MISCELLANEOUS) ×2 IMPLANT
CLIP APPLIE 9.375 MED OPEN (MISCELLANEOUS) IMPLANT
CLIP VESOCCLUDE MED 6/CT (CLIP) ×2 IMPLANT
CNTNR URN SCR LID CUP LEK RST (MISCELLANEOUS) IMPLANT
CONT SPEC 4OZ STRL OR WHT (MISCELLANEOUS)
COVER SURGICAL LIGHT HANDLE (MISCELLANEOUS) ×6 IMPLANT
COVER WAND RF STERILE (DRAPES) ×2 IMPLANT
DECANTER SPIKE VIAL GLASS SM (MISCELLANEOUS) ×2 IMPLANT
DEFOGGER ANTIFOG KIT (MISCELLANEOUS) ×1 IMPLANT
DERMABOND ADVANCED (GAUZE/BANDAGES/DRESSINGS)
DERMABOND ADVANCED .7 DNX12 (GAUZE/BANDAGES/DRESSINGS) IMPLANT
DRAIN CHANNEL 19F RND (DRAIN) ×1 IMPLANT
DRAPE HALF SHEET 40X57 (DRAPES) IMPLANT
DRAPE INCISE 23X17 IOBAN STRL (DRAPES)
DRAPE INCISE 23X17 STRL (DRAPES) ×2 IMPLANT
DRAPE INCISE IOBAN 23X17 STRL (DRAPES) IMPLANT
DRAPE LAPAROSCOPIC ABDOMINAL (DRAPES) ×2 IMPLANT
DRAPE ORTHO SPLIT 77X108 STRL (DRAPES)
DRAPE SLUSH/WARMER DISC (DRAPES) ×1 IMPLANT
DRAPE SURG 17X23 STRL (DRAPES) ×8 IMPLANT
DRAPE SURG ORHT 6 SPLT 77X108 (DRAPES) ×4 IMPLANT
DRAPE UNIVERSAL (DRAPES) ×1 IMPLANT
DRAPE WARM FLUID 44X44 (DRAPES) ×4 IMPLANT
DRESSING ALLEVYN LITE 5X5 NADH (GAUZE/BANDAGES/DRESSINGS) IMPLANT
DRSG ALLEVYN LITE 5X5 NADH (GAUZE/BANDAGES/DRESSINGS) ×3
DRSG AQUACEL AG ADV 3.5X14 (GAUZE/BANDAGES/DRESSINGS) ×2 IMPLANT
DRSG MEPILEX BORDER 4X8 (GAUZE/BANDAGES/DRESSINGS) ×1 IMPLANT
DRSG PAD ABDOMINAL 8X10 ST (GAUZE/BANDAGES/DRESSINGS) ×2 IMPLANT
ELECT BLADE 4.0 EZ CLEAN MEGAD (MISCELLANEOUS) ×3
ELECT BLADE 6.5 EXT (BLADE) IMPLANT
ELECT CAUTERY BLADE 6.4 (BLADE) ×2 IMPLANT
ELECT REM PT RETURN 9FT ADLT (ELECTROSURGICAL) ×3
ELECTRODE BLDE 4.0 EZ CLN MEGD (MISCELLANEOUS) IMPLANT
ELECTRODE REM PT RTRN 9FT ADLT (ELECTROSURGICAL) ×4 IMPLANT
EVACUATOR SILICONE 100CC (DRAIN) ×1 IMPLANT
EVACUATOR SMOKE ACCUVAC VALLEY (FILTER) ×3
FILTER SMOKE EVAC ULPA (FILTER) ×2 IMPLANT
GAUZE SPONGE 4X4 12PLY STRL (GAUZE/BANDAGES/DRESSINGS) ×5 IMPLANT
GAUZE XEROFORM 5X9 LF (GAUZE/BANDAGES/DRESSINGS) IMPLANT
GLOVE BIO SURGEON STRL SZ 6.5 (GLOVE) ×3 IMPLANT
GLOVE BIOGEL PI IND STRL 6.5 (GLOVE) ×4 IMPLANT
GLOVE BIOGEL PI INDICATOR 6.5 (GLOVE) ×4
GOWN BRE IMP PREV XXLGXLNG (GOWN DISPOSABLE) ×2 IMPLANT
GOWN STRL REUS W/ TWL LRG LVL3 (GOWN DISPOSABLE) ×8 IMPLANT
GOWN STRL REUS W/TWL LRG LVL3 (GOWN DISPOSABLE) ×15
HEMOSTAT POWDER SURGIFOAM 1G (HEMOSTASIS) IMPLANT
HOLDER TRACH TUBE VELCRO 19.5 (MISCELLANEOUS) ×1 IMPLANT
KIT BASIN OR (CUSTOM PROCEDURE TRAY) ×5 IMPLANT
KIT DRSG PREVENA PLUS 7DAY 125 (MISCELLANEOUS) ×1 IMPLANT
KIT TURNOVER KIT B (KITS) ×5 IMPLANT
MARKER SKIN DUAL TIP RULER LAB (MISCELLANEOUS) ×2 IMPLANT
MATRIX WOUND MULITLAYER (Tissue) ×1 IMPLANT
MICROMATRIX 1000MG (Tissue) ×6 IMPLANT
NDL 18GX1X1/2 (RX/OR ONLY) (NEEDLE) IMPLANT
NEEDLE 18GX1X1/2 (RX/OR ONLY) (NEEDLE) ×3 IMPLANT
NS IRRIG 1000ML POUR BTL (IV SOLUTION) ×12 IMPLANT
PACK CHEST (CUSTOM PROCEDURE TRAY) ×3 IMPLANT
PACK GENERAL/GYN (CUSTOM PROCEDURE TRAY) ×2 IMPLANT
PAD ARMBOARD 7.5X6 YLW CONV (MISCELLANEOUS) ×12 IMPLANT
PENCIL BUTTON HOLSTER BLD 10FT (ELECTRODE) ×3 IMPLANT
PENCIL SMOKE EVACUATOR (MISCELLANEOUS) ×2 IMPLANT
PLATE STERNAL 1.8MM THICK (Plate) ×2 IMPLANT
PREFILTER EVAC NS 1 1/3-3/8IN (MISCELLANEOUS) ×2 IMPLANT
SCISSORS ENDO CVD 5DCS (MISCELLANEOUS) ×1 IMPLANT
SCREW LOCKING TI 2.3X11MM (Screw) ×2 IMPLANT
SCREW LOCKING TI 2.3X13MM (Screw) ×14 IMPLANT
SET ADHESIVE SKIN CLSR ABRA (MISCELLANEOUS) ×2 IMPLANT
SET TUBE SMOKE EVAC HIGH FLOW (TUBING) ×1 IMPLANT
SHEARS HARMONIC ACE PLUS 36CM (ENDOMECHANICALS) ×1 IMPLANT
SLEEVE SUCTION 125 (MISCELLANEOUS) ×3 IMPLANT
SOLUTION PARTIC MCRMTRX 1000MG (Tissue) IMPLANT
SPONGE DRAIN TRACH 4X4 STRL 2S (GAUZE/BANDAGES/DRESSINGS) ×1 IMPLANT
SPONGE LAP 18X18 RF (DISPOSABLE) IMPLANT
STAPLER VISISTAT 35W (STAPLE) ×3 IMPLANT
STRIP CLOSURE SKIN 1/2X4 (GAUZE/BANDAGES/DRESSINGS) ×2 IMPLANT
SURGILUBE 2OZ TUBE FLIPTOP (MISCELLANEOUS) ×1 IMPLANT
SUT ETHILON 2 0 PSLX (SUTURE) ×2 IMPLANT
SUT ETHILON 3 0 FSL (SUTURE) ×2 IMPLANT
SUT FIBERWIRE #2 38 T-5 BLUE (SUTURE)
SUT FIBERWIRE #5 38 CONV NDL (SUTURE)
SUT MNCRL AB 3-0 PS2 18 (SUTURE) ×3 IMPLANT
SUT MNCRL AB 4-0 PS2 18 (SUTURE) ×6 IMPLANT
SUT MON AB 2-0 CT1 36 (SUTURE) ×2 IMPLANT
SUT MON AB 5-0 PS2 18 (SUTURE) ×2 IMPLANT
SUT PDS AB 0 CT 36 (SUTURE) IMPLANT
SUT PROLENE 3 0 PS 1 (SUTURE) IMPLANT
SUT SILK  1 MH (SUTURE)
SUT SILK 1 MH (SUTURE) IMPLANT
SUT SILK 1 TIES 10X30 (SUTURE) ×1 IMPLANT
SUT SILK 2 0 SH CR/8 (SUTURE) IMPLANT
SUT SILK 3 0 SH CR/8 (SUTURE) ×1 IMPLANT
SUT SILK 3 0 TIES 10X30 (SUTURE) ×1 IMPLANT
SUT VIC AB 1 CTX 18 (SUTURE) ×2 IMPLANT
SUT VIC AB 1 CTX 36 (SUTURE)
SUT VIC AB 1 CTX36XBRD ANBCTR (SUTURE) IMPLANT
SUT VIC AB 2-0 CTX 36 (SUTURE) ×2 IMPLANT
SUT VIC AB 3-0 FS2 27 (SUTURE) IMPLANT
SUT VIC AB 3-0 SH 8-18 (SUTURE) ×2 IMPLANT
SUT VIC AB 3-0 X1 27 (SUTURE) ×3 IMPLANT
SUT VICRYL 2 TP 1 (SUTURE) IMPLANT
SUT VICRYL 5 0 PS 3 18 (SUTURE) ×2 IMPLANT
SUTURE FIBERWR #2 38 T-5 BLUE (SUTURE) IMPLANT
SUTURE FIBERWR #5 38 CONV NDL (SUTURE) IMPLANT
SYR 50ML SLIP (SYRINGE) IMPLANT
SYR BULB IRRIG 60ML STRL (SYRINGE) ×2 IMPLANT
SYSTEM SAHARA CHEST DRAIN ATS (WOUND CARE) ×3 IMPLANT
TAPE CLOTH 4X10 WHT NS (GAUZE/BANDAGES/DRESSINGS) ×2 IMPLANT
TAPE CLOTH SURG 4X10 WHT LF (GAUZE/BANDAGES/DRESSINGS) ×1 IMPLANT
TAPE UMBILICAL COTTON 1/8X30 (MISCELLANEOUS) ×1 IMPLANT
TOWEL GREEN STERILE (TOWEL DISPOSABLE) ×5 IMPLANT
TOWEL GREEN STERILE FF (TOWEL DISPOSABLE) ×5 IMPLANT
TRAP FLUID SMOKE EVACUATOR (MISCELLANEOUS) ×3 IMPLANT
TRAY FOLEY MTR SLVR 14FR STAT (SET/KITS/TRAYS/PACK) ×2 IMPLANT
TROCAR XCEL BLADELESS 5X75MML (TROCAR) ×2 IMPLANT
TUBE CONNECTING 12X1/4 (SUCTIONS) ×3 IMPLANT
TUNNELER SHEATH ON-Q 11GX8 DSP (PAIN MANAGEMENT) IMPLANT
WATER STERILE IRR 1000ML POUR (IV SOLUTION) ×6 IMPLANT

## 2019-09-06 NOTE — Anesthesia Preprocedure Evaluation (Signed)
Anesthesia Evaluation  Patient identified by MRN, date of birth, ID band Patient awake    Reviewed: Allergy & Precautions, H&P , NPO status , Patient's Chart, lab work & pertinent test results  Airway Mallampati: [place] no notable dental hx.    Pulmonary former smoker,    Pulmonary exam normal        Cardiovascular hypertension, Pt. on home beta blockers and Pt. on medications + CAD, + Past MI, + Cardiac Stents and + CABG   Rhythm:Regular Rate:Normal  Open chest wound   Neuro/Psych negative neurological ROS  negative psych ROS   GI/Hepatic Neg liver ROS, hiatal hernia,   Endo/Other  diabetes, Type 2, Oral Hypoglycemic Agents  Renal/GU DialysisRenal disease     Musculoskeletal   Abdominal Normal abdominal exam  (+)   Peds  Hematology  (+) anemia ,   Anesthesia Other Findings   Reproductive/Obstetrics                            Anesthesia Physical  Anesthesia Plan  ASA: IV  Anesthesia Plan: General   Post-op Pain Management:    Induction: Inhalational  PONV Risk Score and Plan: 3 and Ondansetron and Treatment may vary due to age or medical condition  Airway Management Planned: Tracheostomy  Additional Equipment: None, Arterial line and CVP  Intra-op Plan:   Post-operative Plan:   Informed Consent: I have reviewed the patients History and Physical, chart, labs and discussed the procedure including the risks, benefits and alternatives for the proposed anesthesia with the patient or authorized representative who has indicated his/her understanding and acceptance.       Plan Discussed with: CRNA  Anesthesia Plan Comments: (Echo:  1. There prominent respiratory displacement of the interventricular  septum, consistent with enhanced ventricular interdependence, likely due  to constrictive physiology.. Left ventricular ejection fraction, by  estimation, is 45 to  50%. The left ventricle  has mildly decreased function. The left ventricle demonstrates global  hypokinesis. Left ventricular diastolic parameters are consistent with  Grade II diastolic dysfunction (pseudonormalization). Elevated left atrial  pressure.  2. Right ventricular systolic function was not well visualized. The right  ventricular size is normal. Tricuspid regurgitation signal is inadequate  for assessing PA pressure.  3. No pericardial fluid is see, but the pericardium is very thick and  thrombus in the pericardial space cannot be ecluded.  4. The mitral valve is normal in structure. No evidence of mitral valve  regurgitation.  5. The aortic valve is grossly normal. Aortic valve regurgitation is not  visualized. Mild aortic valve sclerosis is present, with no evidence of  aortic valve stenosis. )        Anesthesia Quick Evaluation

## 2019-09-06 NOTE — Anesthesia Procedure Notes (Signed)
Central Venous Catheter Insertion Performed by: Albertha Ghee, MD, anesthesiologist Start/End5/03/2020 12:37 PM, 09/06/2019 12:42 PM Patient location: Pre-op. Preanesthetic checklist: patient identified, IV checked, site marked, risks and benefits discussed, surgical consent, monitors and equipment checked, pre-op evaluation, timeout performed and anesthesia consent Position: Trendelenburg Lidocaine 1% used for infiltration and patient sedated Hand hygiene performed , maximum sterile barriers used  and Seldinger technique used Catheter size: 7 Fr Central line was placed.Double lumen Procedure performed using ultrasound guided technique. Ultrasound Notes:anatomy identified, needle tip was noted to be adjacent to the nerve/plexus identified, no ultrasound evidence of intravascular and/or intraneural injection and image(s) printed for medical record Attempts: 1 Following insertion, line sutured, dressing applied and Biopatch. Post procedure assessment: blood return through all ports, free fluid flow and no air  Patient tolerated the procedure well with no immediate complications.

## 2019-09-06 NOTE — Transfer of Care (Signed)
Immediate Anesthesia Transfer of Care Note  Patient: Christian [name]  Procedure(s) Performed: MEDIASTINAL EXPLORATION (N/A ) STERNAL PLATING (N/A ) LAPAROSCOPIC OMENTAL HARVEST (N/A ) APPLICATION OF ACELL, APPLICATION OF WOUND VAC USING PREVENA INCISIONAL  DRESSING (N/A ) Pectoralis ADVANCEMENT Flap  Patient Location: ICU  Anesthesia Type:General  Level of Consciousness: drowsy  Airway & Oxygen Therapy: Patient remains intubated per anesthesia plan and Patient placed on Ventilator (see vital sign flow sheet for setting)  Post-op Assessment: Report given to RN and Post -op Vital signs reviewed and stable  Post vital signs: Reviewed and stable  Last Vitals:  Vitals Value Taken Time  BP    Temp    Pulse    Resp    SpO2      Last Pain:  Vitals:   09/06/19 1137  TempSrc: Oral  PainSc:       Patients Stated Pain Goal: 2 (16/10/96 0454)  Complications: No apparent anesthesia complications

## 2019-09-06 NOTE — Progress Notes (Signed)
OT Cancellation Note  Patient Details Name: Christian [name] MRN: [id number] DOB: 02-11-1946   Cancelled Treatment:    Reason Eval/Treat Not Completed: Patient at procedure or test/ unavailable  Kailee Essman,H[place]ARY 09/06/2019, 1:39 PM

## 2019-09-06 NOTE — Progress Notes (Signed)
Trach sutures were removed per MD order without any complications. Lurline Idol is secure with trach ties.

## 2019-09-06 NOTE — OR Nursing (Signed)
Ken Caryl DRESSING AND TRACH COLLAR CHANGED BY DR. Marla Roe. SMALL AMT OF THICK TAN COLORED SECRETIONS NOTED AROUND STOMA. PT REMAINS UNDER GENERAL ANESTHESIA AND TOLERATED PROCEDURE WELL.

## 2019-09-06 NOTE — Op Note (Signed)
DATE OF OPERATION: 09/06/2019  LOCATION: [facility] In[facility]  PREOPERATIVE DIAGNOSIS: Sternal wound  POSTOPERATIVE DIAGNOSIS: Same  PROCEDURE:  1. Right pectoralis muscle advancement flap 2. Excision of skin and soft tissue for debridement 2 x 20 cm 3. Layered closure of sternal wound 4. Placement of 10 x 15 cm Acell sheet and 2 gm of powder 5. Placement of VAC  SURGEON: Daisi Kentner Sanger Rechel Delosreyes, DO  ASSISTANT: Dr. Servando Snare  ASSISTANT: Roetta Sessions, PA  CONDITION: Stable  COMPLICATIONS: None  INDICATION: The patient, Christian [name], is a 74 y.o. male born on March 08, 1946, is here for treatment of a sternal wound.   PROCEDURE DETAILS:  The patient was seen prior to surgery and marked.  The IV antibiotics were given. The patient was taken to the operating room and given a general anesthetic with the CT surgery team. A standard time out was performed and all information was confirmed by those in the room. SCDs were placed.   An omental flap was harvested in conjunction with general surgery and CT surgery.  Once that was completed the plastic surgery team, I, joined the procedure.  Dr. Servando Snare assisted throughout the procedure for necessary and needed retraction for exposure and visualization of the operative field.    The right pectoralis muscle was exposed using the bovie to free the chest / breast skin from the muscle.  The muscle edges were identified.  The pectoralis muscle was lifted from the chest wall from the medial to lateral direction.  We protected the blood supply and did not transect the insertion.  As the pectoralis major muscle on the right was lifted we were able to advance it to cover the superior aspect of the sternal defect.  There was approximately 8 cm gap in the sternum from the medial aspect on each side.  Titanium hardware was placed to stabilize the sternum at the inferior and superior portion of the sternum.  This was done by Dr. Servando Snare and  dictated as such.  Once that was in place the right pectoralis major muscle flap was sutured into place over the superior hardware.  3-0 Vicryl was used.  It was also sutured to the medial aspect of the left pectoralis major muscle.  The left pectoralis muscle was freed from the breast and chest were soft tissue.  This provided some medial advancement.  3-0 Vicryl was used to secure the pectoralis muscle over the left portion of the inferior titanium hardware.  A #19 Blake drain was placed through a 2 mm incision of the skin on the right chest.  The drain was placed in the right chest and ended at the sternum.  This was secured to the chest wall with 3-0 silk.  The ACell powder (2 gm) was applied to the sternal defect under and then over the pectoralis muscle.  The 1 layer ACell sheet was placed over the muscle (10 x 15 cm).  This was secured to the pectoralis muscle at 4 points using 5-0 Monocryl.  The skin edges on both the right and left original sternal incision were excised for a total of 2 x 20 cm.  Hemostasis was achieved with electrocautery.  The incision was then approximated using 3-0 Monocryl for the deep sutures.  The 2-0 nylon was used with vertical mattress sutures at the inferior portion of the incision due to the tightness of the tissue.  Additional 3-0 Monocryl and 4-0 Monocryl was used for the remaining layers of the incision.  A Prevena  VAC was applied.  There was an excellent seal.  A Biopatch was placed around the [place] drain.  Abra straps were placed at the inferior portion of the sternum to aid with decreasing the tension at the incision.  The patient was allowed to wake up and taken to recovery room in stable condition at the end of the case. The family was notified at the end of the case.   The advanced practice practitioner (APP) assisted throughout the case.  The APP was essential in retraction and counter traction when needed to make the case progress smoothly.  This retraction and  assistance made it possible to see the tissue plans for the procedure.  The assistance was needed for blood control, tissue re-approximation and assisted with closure of the incision site.

## 2019-09-06 NOTE — Progress Notes (Signed)
      [address]       [place] [address]             [phone]    Pre Procedure note for inpatients:   CORNEILUS HEGGIE has been scheduled for Procedure(s): MEDIASTINAL EXPLORATION WITH STERNAL WOUND IRRIGATION (N/A) Application Of Wound Vac (N/A) and harvest of omental flap and closure of sternotomy wound  today. The various methods of treatment have been discussed with the patient. After consideration of the risks, benefits and treatment options the patient has consented to the planned procedure.   The patient has been seen and labs reviewed. There are no changes in the patient's condition to prevent proceeding with the planned procedure today.  Recent labs:  Lab Results  Component Value Date   WBC 12.4 (H) 09/06/2019   HGB 8.7 (L) 09/06/2019   HCT 28.0 (L) 09/06/2019   PLT 200 09/06/2019   GLUCOSE 155 (H) 09/06/2019   CHOL 107 08/11/2019   TRIG 71 08/27/2019   HDL 42 08/11/2019   LDLDIRECT 40.0 11/23/2018   LDLCALC 40 08/11/2019   ALT 49 (H) 09/02/2019   AST 38 09/02/2019   NA 138 09/06/2019   K 4.5 09/06/2019   CL 102 09/06/2019   CREATININE 1.92 (H) 09/06/2019   BUN 28 (H) 09/06/2019   CO2 26 09/06/2019   TSH 2.590 04/19/2019   INR 1.3 (H) 08/19/2019   HGBA1C 7.7 (H) 08/14/2019    Grace Aldon, MD 09/06/2019 12:01 PM

## 2019-09-06 NOTE — Progress Notes (Signed)
Patient ID: Christian [name], male   DOB: [birth year], 74 y.o.   MRN: [id number] S:  removed 2789 with CRRT-  Is alert- no pressors- BP is adequate.  To OR for another attempt at wound closure with the help of plastics   O:BP 110/69   Pulse 87   Temp 98.4 F (36.9 C) (Oral)   Resp 18   Ht 5' 9"  (1.753 m)   Wt 85.3 kg   SpO2 100%   BMI 27.77 kg/m   Intake/Output Summary (Last 24 hours) at 09/06/2019 0730 Last data filed at 09/06/2019 0700 Gross per 24 hour  Intake 2050.98 ml  Output 4840 ml  Net -2789.02 ml   Intake/Output: I/O last 3 completed shifts: In: 3143.4 [I.V.:230; NG/GT:1600; IV Piggyback:1308.4] Out: 9417 [Drains:230; Other:5327; Stool:1200; Chest Tube:20]  Intake/Output this shift:  No intake/output data recorded. Weight change:  Gen:  Alert on vent via trach left subclavian HD cath placed 5/7 CVS: RRR, no rub, open chest wound with wound vac in place Resp: cta Abd: distended, +BS, soft, NT Ext: 1+ edema  Recent Labs  Lab 09/02/19 0311 09/02/19 1530 09/03/19 0337 09/03/19 1530 09/04/19 0358 09/04/19 1600 09/05/19 0434 09/05/19 1630 09/06/19 0404  NA 137   < > 140 139 139 137 139 139 138  K 3.9   < > 4.4 4.2 4.4 5.0 4.8 4.3 4.5  CL 104   < > 104 104 103 103 105 102 102  CO2 20*   < > 24 25 25 23 25 26 26   GLUCOSE 238*   < > 167* 135* 82 191* 172* 164* 155*  BUN 87*   < > 53* 40* 32* 35* 34* 30* 28*  CREATININE 4.33*   < > 2.87* 2.47* 2.24* 2.53* 2.12* 1.94* 1.92*  ALBUMIN 2.1*   < > 2.3* 2.1* 2.0* 2.3* 2.2* 2.4* 2.4*  CALCIUM 8.2*   < > 8.4* 8.0* 8.0* 8.1* 7.9* 8.4* 8.5*  PHOS  --    < > 2.3* 2.3* 2.9 3.3 2.3* 3.5 2.7  AST 38  --   --   --   --   --   --   --   --   ALT 49*  --   --   --   --   --   --   --   --    < > = values in this interval not displayed.   Liver Function Tests: Recent Labs  Lab 09/02/19 0311 09/02/19 1530 09/05/19 0434 09/05/19 1630 09/06/19 0404  AST 38  --   --   --   --   ALT 49*  --   --   --   --   ALKPHOS 104  --    --   --   --   BILITOT 1.0  --   --   --   --   PROT 5.1*  --   --   --   --   ALBUMIN 2.1*   < > 2.2* 2.4* 2.4*   < > = values in this interval not displayed.   No results for input(s): LIPASE, AMYLASE in the last 168 hours. No results for input(s): AMMONIA in the last 168 hours. CBC: Recent Labs  Lab 08/31/19 0353 08/31/19 0353 09/01/19 0349 09/01/19 1758 09/02/19 0311 09/02/19 0945 09/03/19 0337 09/03/19 0337 09/04/19 0358 09/05/19 0812 09/06/19 0404  WBC 27.3*   < > 33.6*  --  21.6*  --  16.5*   < >  10.9* 12.1* 12.4*  NEUTROABS 22.0*  --  25.7*  --  16.8*  --   --   --   --   --   --   HGB 7.9*   < > 7.4*   < > 6.4*   < > 8.8*   < > 8.5* 7.4* 8.7*  HCT 25.0*   < > 23.9*   < > 20.0*   < > 27.5*   < > 27.4* 24.1* 28.0*  MCV 96.9   < > 98.0  --  96.2  --  95.2  --  97.9 97.6 98.2  PLT 298   < > 265  --  184  --  176   < > 116* 175 200   < > = values in this interval not displayed.   Cardiac Enzymes: No results for input(s): CKTOTAL, CKMB, CKMBINDEX, TROPONINI in the last 168 hours. CBG: Recent Labs  Lab 09/05/19 1150 09/05/19 1557 09/05/19 1953 09/05/19 2340 09/06/19 0411  GLUCAP 146* 147* 183* 137* 136*    Iron Studies: No results for input(s): IRON, TIBC, TRANSFERRIN, FERRITIN in the last 72 hours. Studies/Results: No results found. . sodium chloride   Intravenous Once  . sodium chloride   Intravenous Once  . amiodarone  200 mg Per Tube BID  . aspirin  81 mg Per Tube Daily  . atorvastatin  40 mg Per Tube Daily  . B-complex with vitamin C  1 tablet Per Tube Daily  . bisacodyl  10 mg Oral Daily   Or  . bisacodyl  10 mg Rectal Daily  . chlorhexidine gluconate (MEDLINE KIT)  15 mL Mouth Rinse BID  . Chlorhexidine Gluconate Cloth  6 each Topical Daily  . clopidogrel  75 mg Per Tube Daily  . darbepoetin (ARANESP) injection - NON-DIALYSIS  150 mcg Subcutaneous Q Mon-1800  . docusate  100 mg Per Tube BID  . feeding supplement (PRO-STAT SUGAR FREE 64)  60 mL Per  Tube TID  . insulin aspart  0-24 Units Subcutaneous Q4H  . insulin detemir  10 Units Subcutaneous BID  . mouth rinse  15 mL Mouth Rinse 10 times per day  . midodrine  10 mg Oral TID WC  . pantoprazole sodium  40 mg Per Tube Daily  . sertraline  25 mg Oral Daily  . sodium chloride flush  10-40 mL Intracatheter Q12H  . sodium chloride flush  10-40 mL Intracatheter Q12H  . trospium  20 mg Per Tube BID    BMET    Component Value Date/Time   NA 138 09/06/2019 0404   NA 141 06/19/2019 1130   K 4.5 09/06/2019 0404   CL 102 09/06/2019 0404   CO2 26 09/06/2019 0404   GLUCOSE 155 (H) 09/06/2019 0404   BUN 28 (H) 09/06/2019 0404   BUN 21 06/19/2019 1130   CREATININE 1.92 (H) 09/06/2019 0404   CALCIUM 8.5 (L) 09/06/2019 0404   GFRNONAA 34 (L) 09/06/2019 0404   GFRAA 39 (L) 09/06/2019 0404   CBC    Component Value Date/Time   WBC 12.4 (H) 09/06/2019 0404   RBC 2.85 (L) 09/06/2019 0404   HGB 8.7 (L) 09/06/2019 0404   HCT 28.0 (L) 09/06/2019 0404   PLT 200 09/06/2019 0404   MCV 98.2 09/06/2019 0404   MCH 30.5 09/06/2019 0404   MCHC 31.1 09/06/2019 0404   RDW 20.6 (H) 09/06/2019 0404   LYMPHSABS 1.1 09/02/2019 0311   MONOABS 1.5 (H) 09/02/2019 0311   EOSABS 0.3  09/02/2019 0311   BASOSABS 0.1 09/02/2019 0311     Assessment/Plan:  1. Shock- improved now on meripenem and vanc.   2. AKI- (crt 1.22 on 07/24/19) following cardiac cath, cabg, cardiogenic shock, cardiac arrest due to tamponade, and acute on chronic CHF. Started CRRT on 08/24/19 due to anasarca and need for volume removal to close his open chest.  CRRT was held on 09/01/19 with line changes. 1. Resumed CRRT 09/02/19 and UF as tolerated. 2. Left subclavian trialysis catheter placed by PCCM on 09/01/19 3. Resume 4K/2.5Ca for all replacement fluids.  no heparin  4. UF goal 0-100 ml/hr depending upon BP response 5. With open chest feel like we need to continue CRRT at this time until closure 6. No UOP-  Unclear if recovery is  possible given major insult 7. Will plan to stop CRRT prior to surgery.  If able to close chest and BP remains good may be able to transition to IHD.  Have told nursing it may not be necessary to resume CRRT following OR today 3. CAD s/p CABG x 4 complicated by cardiac tamponade s/p reopened chest urgently on 08/18/19 with wound vac.most recent OR On 5/10-  Planning again today  4. Acute systolic heart failure due to ischemic cardiomyopathy- volume markedly improved with CRRT. Will cont UF since tolerating and no pressors   5. Postoperative acute hypoxic respiratory failure- reintubated 08/18/19 and likely aspiration. Now trach 6. Anemia-  transfuse prn.added ESA 7. Ileus-  still distended. Per primary. 8. E[place]tes-  Phos and K both adequate this AM  Louis Meckel  [employer] [phone]

## 2019-09-06 NOTE — Progress Notes (Signed)
Patient ID: Christian [name], male   DOB: May 06, 1945, 74 y.o.   MRN: [id number] Patient ID: Christian [name], male   DOB: Apr 03, 1946, 74 y.o.   MRN: [id number]     Advanced Heart Failure Rounding Note  PCP-Cardiologist: No primary care provider on file.   Subjective:    08/09/19 Admitted with NSTEM and ? Small RLL PE. Weight 213 pounds.  08/15/19 CABG x4 on 08/15/19 08/18/19 Cardiac arrest due to tamponade. Chest opened for washout 08/20/19 Echo post-pericardial evacuation: EF 40-45% with some residual pericardial clot. 4/26 Chest washout in OR 4/29 CVVH started 5/5 OR for chest washout, tracheostomy 5/7 HD catheter replaced 09/04/19 OR for washout/dressing change.   Remains on vent through trach.    Trach aspirate + rare pseudomonas  WBC 21k -> 16->10.9->CBC pending.   Heparin off except for CRT  Denies pain,.  Objective:   Weight Range: 85.3 kg Body mass index is 27.77 kg/m.   Vital Signs:   Temp:  [98.2 F (36.8 C)-98.7 F (37.1 C)] 98.2 F (36.8 C) (05/12 0756) Pulse Rate:  [76-101] 91 (05/12 0735) Resp:  [17-28] 19 (05/12 0735) BP: (83-139)/(57-78) 110/69 (05/12 0735) SpO2:  [96 %-100 %] 100 % (05/12 0735) FiO2 (%):  [30 %] 30 % (05/12 0735) Last BM Date: 09/05/19  Weight change: Filed Weights   09/01/19 0451 09/03/19 0421 09/04/19 0454  Weight: 98.5 kg 89 kg 85.3 kg    Intake/Output:   Intake/Output Summary (Last 24 hours) at 09/06/2019 0835 Last data filed at 09/06/2019 0800 Gross per 24 hour  Intake 1850.98 ml  Output 4690 ml  Net -2839.02 ml      Physical Exam  General:  On vent through trach  HEENT: normal Neck: trach , supple. JVP hard to assess. Carotids 2+ bilat; no bruits. No lymphadenopathy or thryomegaly appreciated. Cor: PMI nondisplaced. Regular rate & rhythm. No rubs, gallops or murmurs. L subclavian HD catheter. VAC dressing in place  Lungs: clear Abdomen: soft, nontender, nondistended. No hepatosplenomegaly. No bruits or masses. Good bowel  sounds. Extremities: no cyanosis, clubbing, rash, edema Neuro: alert follows commands. MAE x3.    Telemetry   NSR 70-90s personally reviewed.   Labs    CBC Recent Labs    09/05/19 0812 09/06/19 0404  WBC 12.1* 12.4*  HGB 7.4* 8.7*  HCT 24.1* 28.0*  MCV 97.6 98.2  PLT 175 030   Basic Metabolic Panel Recent Labs    09/05/19 0434 09/05/19 0434 09/05/19 1630 09/06/19 0404  NA 139   < > 139 138  K 4.8   < > 4.3 4.5  CL 105   < > 102 102  CO2 25   < > 26 26  GLUCOSE 172*   < > 164* 155*  BUN 34*   < > 30* 28*  CREATININE 2.12*   < > 1.94* 1.92*  CALCIUM 7.9*   < > 8.4* 8.5*  MG 2.4  --   --  2.5*  PHOS 2.3*   < > 3.5 2.7   < > = values in this interval not displayed.   Liver Function Tests Recent Labs    09/05/19 1630 09/06/19 0404  ALBUMIN 2.4* 2.4*   No results for input(s): LIPASE, AMYLASE in the last 72 hours. Cardiac Enzymes No results for input(s): CKTOTAL, CKMB, CKMBINDEX, TROPONINI in the last 72 hours.  BNP: BNP (last 3 results) Recent Labs    08/09/19 1240 09/02/19 0311  BNP 89.1 761.0*    ProBNP (last  3 results) Recent Labs    11/23/18 1120 08/09/19 0922  PROBNP 18.0 78.0     D-Dimer No results for input(s): DDIMER in the last 72 hours. Hemoglobin A1C No results for input(s): HGBA1C in the last 72 hours. Fasting Lipid Panel No results for input(s): CHOL, HDL, LDLCALC, TRIG, CHOLHDL, LDLDIRECT in the last 72 hours. Thyroid Function Tests No results for input(s): TSH, T4TOTAL, T3FREE, THYROIDAB in the last 72 hours.  Invalid input(s): FREET3  Other results:   Imaging    DG CHEST PORT 1 VIEW  Result Date: 09/06/2019 CLINICAL DATA:  Status post CABG. EXAM: PORTABLE CHEST 1 VIEW COMPARISON:  Chest x-ray dated Sep 04, 2019. FINDINGS: Unchanged tracheostomy and feeding tubes. Unchanged left subclavian central venous catheter and right-sided chest tube. Interval removal of the mediastinal drain with new mediastinal wound VAC.  Stable cardiomegaly status post CABG. Normal pulmonary vascularity. Unchanged left basilar atelectasis and small left pleural effusion. Unchanged 1.4 cm nodule in the peripheral right upper lobe. No pneumothorax. No acute osseous abnormality. IMPRESSION: 1. Stable chest. Unchanged left basilar atelectasis and small left pleural effusion. Electronically Signed   By: Titus Dubin M.D.   On: 09/06/2019 08:29     Medications:     Scheduled Medications:  sodium chloride   Intravenous Once   sodium chloride   Intravenous Once   amiodarone  200 mg Per Tube BID   aspirin  81 mg Per Tube Daily   atorvastatin  40 mg Per Tube Daily   B-complex with vitamin C  1 tablet Per Tube Daily   bisacodyl  10 mg Oral Daily   Or   bisacodyl  10 mg Rectal Daily   chlorhexidine gluconate (MEDLINE KIT)  15 mL Mouth Rinse BID   Chlorhexidine Gluconate Cloth  6 each Topical Daily   clopidogrel  75 mg Per Tube Daily   darbepoetin (ARANESP) injection - NON-DIALYSIS  150 mcg Subcutaneous Q Mon-1800   docusate  100 mg Per Tube BID   feeding supplement (PRO-STAT SUGAR FREE 64)  60 mL Per Tube TID   insulin aspart  0-24 Units Subcutaneous Q4H   insulin detemir  10 Units Subcutaneous BID   mouth rinse  15 mL Mouth Rinse 10 times per day   midodrine  10 mg Oral TID WC   pantoprazole sodium  40 mg Per Tube Daily   sertraline  25 mg Oral Daily   sodium chloride flush  10-40 mL Intracatheter Q12H   sodium chloride flush  10-40 mL Intracatheter Q12H   trospium  20 mg Per Tube BID    Infusions:   prismasol BGK 4/2.5 500 mL/hr at 09/05/19 1418    prismasol BGK 4/2.5 300 mL/hr at 09/04/19 1800   sodium chloride Stopped (09/01/19 1008)    ceFAZolin (ANCEF) IV      ceFAZolin (ANCEF) IV     feeding supplement (VITAL 1.5 CAL) 50 mL/hr at 09/05/19 1700   fluconazole (DIFLUCAN) IV Stopped (09/05/19 2208)   heparin 10,000 units/ 20 mL infusion syringe 1,000 Units/hr (09/06/19 0522)    lactated ringers 20 mL/hr at 08/20/19 1200   meropenem (MERREM) IV Stopped (09/06/19 0555)   norepinephrine (LEVOPHED) Adult infusion Stopped (09/02/19 0647)   prismasol BGK 4/2.5 1,500 mL/hr at 09/05/19 1646   vancomycin Stopped (09/05/19 1845)   vasopressin (PITRESSIN) infusion - *FOR SHOCK* Stopped (09/02/19 1634)    PRN Medications: ALPRAZolam, cromolyn, dextrose, heparin, heparin, hydrALAZINE, HYDROmorphone (DILAUDID) injection, ondansetron (ZOFRAN) IV, oxyCODONE, sodium chloride flush, sodium  chloride flush     Assessment/Plan   1. CAD: s/p PDA angioplasty + stent in 02/2014.  Admitted for NSTEMI 4/14. Hs Trop peaked at 3,309.  LHC w/ severe 3V CAD. Echo w/ reduced EF 30-35%. RV ok.  S/p CABG x 4 on 4/20 (LIMA-LAD, RIMA to PDA, Lt radial artery graft to 1st OM and distal OM branches of LCx).  Chest reopened urgently at the bedside for cardiac tamponade and removal of clot on 4/23  - Has been on ASA, would stop Plavix as he will need anticoagulation long-term.  - Atorvastatin 40 daily.   - Plan for chest closure today with muscle flap by plastic surgery in OR.  2. Acute Systolic Heart Failure: Ischemic Cardiomyopathy -> cardiogenic shock.  LVEF severely reduced at 30-35%, in the setting of NSTEM/ Multivessel CAD, RV ok initially.  Underwent urgent sternotomy at the bedside for cardiac tamponade and removal of clot on 4/23. Chest remains open. Luiz Blare numbers have been concerning for RV failure (swan now out).  Echo 4/24 EF 40 to 45% with inotropic support, small amount of residual pericardial clot, RV moderately hypokinetic.  He has been getting CVVH, looks euvolemic.   - Off all pressors.  - Transition from CVVH to iHD, will stop CVVH today and start iHD post-op.  3.  Postoperative acute hypoxic respiratory failure: Reintubated on 4/23 due to cardiogenic shock and return to OR. Now with tracheostomy, FiO2 30% .  -  Per CCM.  4. AKI: Baseline SCr 0.9. Creatinine peaked to 5.3, suspect  ATN in setting of cardiogenic shock/tamponade.  - Stop CVVH today, will need to begin iHD post-muscle flap with timing per nephrology.  5. Shock liver: Due to cardiac arrest on 4/23.  LFTs trending down.  6. 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 Maintaining NSR with PACs.   - Continue amiodarone per tube. - Off heparin due to bleeding. Will need to start [place] at appropriate interval after today's surgery.  7.  Possible small right lower lobe PE: Chest CT 4/14 w/ small RLL PE.  Bilateral Venous Dopplers negative for DVT.  - Off heparin due to bleeding. Start DOAC at appropriate interval after today's surgery.  8. T2DM: Hgb A1c 7.7 - Remains on insulin 9. Pericardial tamponade: 4/23 return to OR for clot removal, chest wall left open. Patient had been on heparin gtt for DCCV of atrial flutter. Returned to the OR 5/10 for washout.    - Muscle flap planned today to close chest.  10. ID: Has been on vancomycin/cefepime with open chest. Cefepime switched to meropenem on 5/7 WBCs rising. Concern for infection/developing sepsis.  Blood cultures negative so far. Trach aspirate + rare pseudomonas => Pseudomonas tracheitis versus PNA. HD catheter changed 5/7.  - Continue vancomycin/meropenem/fluconazole, per CCM.   13. Post-op anemia: Now stable.    Loralie Champagne 09/06/2019 8:35 AM

## 2019-09-06 NOTE — Progress Notes (Signed)
Pharmacy Antibiotic Note  Christian [name] is a 74 y.o. male admitted on 08/09/2019 with open chest.  Pharmacy has been previously been consulted for Vancomycin and meropenem dosing, recently added Fluconazole for intra-abdominal coverage of yeast.   Trach aspirate growing Pseudomonas, CRRT stopped for surgery, planning to transition to [facility] tomorrow once chest closed.   Plan: -Merrem 500mg  QHS -Vancomycin 750mg  IV x1 since received ~18h of CRRT after last dose then follow-up iHD plans for further dosing -Fluconazole 400mg  q24H    Height: 5\' 9"  (175.3 cm) Weight: 85.3 kg (188 lb 0.8 oz) IBW/kg (Calculated) : 70.7  Temp (24hrs), Avg:98.5 F (36.9 C), Min:98.2 F (36.8 C), Max:99 F (37.2 C)  Recent Labs  Lab 08/30/19 2107 08/31/19 0353 09/02/19 0311 09/02/19 1530 09/03/19 0337 09/03/19 1530 09/04/19 0358 09/04/19 1600 09/05/19 0434 09/05/19 0812 09/05/19 1630 09/06/19 0404  WBC  --    < > 21.6*  --  16.5*  --  10.9*  --   --  12.1*  --  12.4*  CREATININE  --    < > 4.33*   < > 2.87*   < > 2.24* 2.53* 2.12*  --  1.94* 1.92*  VANCOTROUGH 30*  --   --   --   --   --   --   --   --   --   --   --    < > = values in this interval not displayed.    Estimated Creatinine Clearance: 37.6 mL/min (A) (by C-G formula based on SCr of 1.92 mg/dL (H)).    No Known Allergies  Antimicrobials this admission: Vancomycin 4/23 >> Cefepime 4/23 >> 5/7 Meropenem 5/7 >> Fluconazole 5/7 >>  Dose Adjustments: -vanc 2 g x 1 and cefepime 2>1g q24h for open chest 4/23> 2gm q12 for CRRT -VR 20 - redose 1 g 4/25 -VR 22 - no dose on 4/27 -4/29 pre crrt VR  16 >vanc 1250mg  q24 -5/5 VT 30 will hold dose tonight and drop to 1gm q24 start 5/6 pm  Microbiology results: 4/22 BCx x 2 > Neg 5/2 TA: ng>reincubate>nf 5/2 bld x 2: ng4d 5/6 resp cx (TA) > pseudomonas - pan sensitive  Arrie Senate, PharmD, BCPS Clinical Pharmacist [phone] Please check AMION for all [facility]  numbers 09/06/2019

## 2019-09-06 NOTE — Op Note (Signed)
Preoperative diagnosis: sternal dehiscence  Postoperative diagnosis: same   Procedure: laparoscopic omental harvest and placement into mediastinum  Surgeon: Gurney Maxin, M.D.  Asst: Ceasar Mons, M.D.  Anesthesia: general  Indications for procedure: Christian [name] is a 74 y.o. 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  Description of procedure: The patient was brought into the operative suite. Anesthesia was administered with General endotracheal anesthesia. WHO checklist was applied. The patient was then placed in supine position. The area was prepped and draped in the usual sterile fashion.  Next, a left subcostal incision was made. A 69mm trocar was used to gain access to the peritoneal cavity by optical entry technique. Pneumoperitoneum was applied with a high flow and low pressure. The laparoscope was reinserted to confirm position.  2 additional trocars were placed one 5 mm trocar in the left midline and 1 5 mm trocar in the left lower quadrant. The omentum was inspected. The left omentum was divided because of adhesion to the left colon. This allowed near complete mobility. One adhesions to the hepatic flexure was divided with harmonic scalpel allowing a large portion of the omentum to reach the diaphragm easily.  Next, Dr. Servando Snare made an incision through the diaphragm and the omentum was brought through and gently extracted to cover the entirety of the mediastinum. Pneumoperitoneum was evacuated. The incisions were closed with staples.  Dr. Madolyn Frieze and Dr. Marla Roe then performed additional care to the dehiscence, please see their dictation for further details.  Findings: good length of omentum  Specimen: none  Implant: none   Blood loss: 10 ml  Local anesthesia: none  Complications: none  Gurney Maxin, M.D. General, Bariatric, & Minimally Invasive Surgery [facility]

## 2019-09-06 NOTE — Interval H&P Note (Signed)
History and Physical Interval Note:  09/06/2019 4:23 PM  Christian [name]  has presented today for surgery, with the diagnosis of OPEN CHEST.  The various methods of treatment have been discussed with the patient and family. After consideration of risks, benefits and other options for treatment, the patient has consented to  Procedure(s): MEDIASTINAL EXPLORATION (N/A) STERNAL PLATING (N/A) LAPAROSCOPIC OMENTAL HARVEST (N/A) APPLICATION OF ACELL, APPLICATION OF WOUND VAC USING PREVENA INCISIONAL  DRESSING (N/A) Pectoralis ADVANCEMENT Flap as a surgical intervention.  The patient's history has been reviewed, patient examined, no change in status, stable for surgery.  I have reviewed the patient's chart and labs.  Questions were answered to the patient's satisfaction.     Loel Lofty [name]

## 2019-09-06 NOTE — Brief Op Note (Addendum)
      [phone]       [place] [address]             [phone]      09/06/2019  4:25 PM  PATIENT:  Christian [name]  74 y.o. male  PRE-OPERATIVE DIAGNOSIS:  OPEN CHEST  POST-OPERATIVE DIAGNOSIS:  OPEN CHEST S/P OPEN HEART SURGERY  PROCEDURE:  Procedure(s): MEDIASTINAL EXPLORATION (N/A) STERNAL PLATING (N/A) LAPAROSCOPIC OMENTAL HARVEST (N/A) APPLICATION OF ACELL, APPLICATION OF WOUND VAC USING PREVENA INCISIONAL  DRESSING (N/A) Pectoralis ADVANCEMENT Flap  SURGEON:  Surgeon(s) and Role: Panel 1:    Grace Jia, MD - Primary Panel 2:    * Kinsinger, Arta Bruce, MD - Primary Panel 3:    * Dillingham, Loel Lofty, DO - Primary    ANESTHESIA:   general  EBL:  50 mL   BLOOD ADMINISTERED:none  DRAINS: blake  drain left chest wall    LOCAL MEDICATIONS USED:  NONE  SPECIMEN:  No Specimen  DISPOSITION OF SPECIMEN:  N/A  COUNTS:  YES   DICTATION: .Dragon Dictation  PLAN OF CARE: in patient   PATIENT DISPOSITION:  ICU - intubated and hemodynamically stable.   Delay start of Pharmacological VTE agent (>24hrs) due to surgical blood loss or risk of bleeding: yes

## 2019-09-06 NOTE — Progress Notes (Signed)
I evaluated Christian [name] and spoke with him and his wife about the plan for laparoscopic omental harvest to improve blood flow and tissue over the mediastinal wound. They should good understanding and wanted to proceed with the plan.

## 2019-09-06 NOTE — Anesthesia Procedure Notes (Signed)
Arterial Line Insertion Start/End5/03/2020 12:40 PM Performed by: Candis Shine, CRNA, CRNA  Patient location: OR. Preanesthetic checklist: patient identified, IV checked, site marked, risks and benefits discussed, surgical consent, monitors and equipment checked, pre-op evaluation, timeout performed and anesthesia consent Lidocaine 1% used for infiltration Right, radial was placed Catheter size: 20 G Hand hygiene performed  and maximum sterile barriers used   Attempts: 1 Procedure performed without using ultrasound guided technique. Following insertion, dressing applied and Biopatch. Post procedure assessment: normal and unchanged  Patient tolerated the procedure well with no immediate complications.

## 2019-09-06 NOTE — Op Note (Signed)
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  SURGICAL PROCEDURE:  Mediastinal exploration with irrigation and debridement. Placement of Wound VAC  SURGEON:  Lanelle Bal, MD  BRIEF HISTORY:  A 74 year old male who 2 weeks previously had undergone coronary artery bypass grafting with bilateral internal mammary artery grafts.  Early postoperatively, the patient had a cardiac arrest and his sternum was reopened at that time.  He  was successfully resuscitated.  Ultimately was ventilator dependent with tracheostomy in place and developed acute renal failure on CVVH.  A week previously, attempt had been made to close the chest.  However, this was not possible.  Patient at this  point was brought back to the operating room for exploration of the mediastinum, washout, possible closure if possible.  DESCRIPTION OF PROCEDURE:  The patient was brought to the operating room, was sedated by anesthesia.  The previously placed wound VAC was removed.  2 mediastinal tubes were removed.  The patient had 2 pleural tubes, 1 on each side that were left in  place.  The wound was carefully examined.  Photographs were placed in the brief OP note.  There was some granulation tissue beginning to form on the cut bone edge of the manubrium and the midportion of the sternum on each side was cracked likely during  the CPR process.  The lower portion of the sternum had very little marrow bleeding.  The entire wound was 23 cm in length.   The skin edges were 11 cm apart.  The bone edge was 5 cm apart.  The soft tissue edges of the chest wall were debrided slightly  and a Pulsavac was used also.  Right ventricle was free of any obvious infection.  Cultures were obtained.  With the size of the wound, the length of time that it had been opened, the chance of closing it primarily were thought to be not supported.   After examination of the wound we consulted plastic surgery and general surgery for placement of omental flap and secondary closure of the wound.  A new wound VAC, white sponge was placed over the right ventricle, a waffle sponge along the skin edges,  and a wound VAC placed back.  The patient was then returned to the surgical intensive care unit.  The plan for secondary closure with omental flap and possible advancement pectoralis flaps was discussed with the patient's wife and brother.  Blood loss  was minimal.  CN/NUANCE  D:09/06/2019 T:09/06/2019 JOB:[id number]

## 2019-09-06 NOTE — Progress Notes (Signed)
Patient ID: Christian [name], male   DOB: 1946-04-26, 74 y.o.   MRN: [id number] EVENING ROUNDS NOTE :     [address]       [place] [address]             [phone]                 Day of Surgery Procedure(s) (LRB): MEDIASTINAL EXPLORATION (N/A) STERNAL PLATING (N/A) LAPAROSCOPIC OMENTAL HARVEST (N/A) APPLICATION OF ACELL, APPLICATION OF WOUND VAC USING PREVENA INCISIONAL  DRESSING (N/A) Pectoralis ADVANCEMENT Flap  Total Length of Stay:  LOS: 28 days  BP 103/63   Pulse (!) 101   Temp 99 F (37.2 C) (Oral)   Resp 19   Ht 5\' 9"  (1.753 m)   Wt 85.3 kg   SpO2 100%   BMI 27.77 kg/m   .Intake/Output      05/11 0701 - 05/12 0700 05/12 0701 - 05/13 0700   I.V. (mL/kg) 110 (1.3) 1340 (15.7)   NG/GT 1000 30   IV Piggyback 941    Chest Tube     Total Intake(mL/kg) 2051 (24) 1370 (16.1)   Urine (mL/kg/hr) 0 (0)    Emesis/NG output 0    Drains 180 0   Other 3590 321   Stool 1050 0   Blood  50   Chest Tube 20 0   Total Output 4840 371   Net -2789 +999        Stool Occurrence 1 x      .  prismasol BGK 4/2.5 500 mL/hr at 09/05/19 1418  .  prismasol BGK 4/2.5 300 mL/hr at 09/04/19 1800  . sodium chloride Stopped (09/01/19 1008)  . feeding supplement (VITAL 1.5 CAL) 50 mL/hr at 09/05/19 1700  . fluconazole (DIFLUCAN) IV    . heparin 10,000 units/ 20 mL infusion syringe 1,000 Units/hr (09/06/19 0522)  . lactated ringers 20 mL/hr at 08/20/19 1200  . meropenem (MERREM) IV    . norepinephrine (LEVOPHED) Adult infusion Stopped (09/02/19 0647)  . prismasol BGK 4/2.5 1,500 mL/hr at 09/06/19 1000  . vancomycin    . vasopressin (PITRESSIN) infusion - *FOR SHOCK* Stopped (09/02/19 1634)     Lab Results  Component Value Date   WBC 14.2 (H) 09/06/2019   HGB 8.6 (L) 09/06/2019   HCT 27.7 (L) 09/06/2019   PLT 207 09/06/2019   GLUCOSE 127 (H) 09/06/2019   CHOL 107 08/11/2019   TRIG 71 08/27/2019   HDL 42 08/11/2019   LDLDIRECT 40.0 11/23/2018   LDLCALC 40  08/11/2019   ALT 49 (H) 09/02/2019   AST 38 09/02/2019   NA 138 09/06/2019   K 5.1 09/06/2019   CL 105 09/06/2019   CREATININE 2.60 (H) 09/06/2019   BUN 32 (H) 09/06/2019   CO2 24 09/06/2019   TSH 2.590 04/19/2019   INR 1.3 (H) 08/19/2019   HGBA1C 7.7 (H) 08/14/2019   Stable postop On vent but awake Chest xray ok   Grace Derrious MD  Beeper [phone] Office [phone] 09/06/2019 6:08 PM

## 2019-09-07 ENCOUNTER — Inpatient Hospital Stay (HOSPITAL_COMMUNITY): Payer: Medicare Other

## 2019-09-07 ENCOUNTER — Encounter: Payer: Self-pay | Admitting: *Deleted

## 2019-09-07 DIAGNOSIS — I480 Paroxysmal atrial fibrillation: Secondary | ICD-10-CM

## 2019-09-07 LAB — RENAL FUNCTION PANEL
Albumin: 2.2 g/dL — ABNORMAL LOW (ref 3.5–5.0)
Anion gap: 12 (ref 5–15)
BUN: 52 mg/dL — ABNORMAL HIGH (ref 8–23)
CO2: 21 mmol/L — ABNORMAL LOW (ref 22–32)
Calcium: 8.8 mg/dL — ABNORMAL LOW (ref 8.9–10.3)
Chloride: 104 mmol/L (ref 98–111)
Creatinine, Ser: 4.32 mg/dL — ABNORMAL HIGH (ref 0.61–1.24)
GFR calc Af Amer: 15 mL/min — ABNORMAL LOW (ref >60)
GFR calc non Af Amer: 13 mL/min — ABNORMAL LOW (ref >60)
Glucose, Bld: 112 mg/dL — ABNORMAL HIGH (ref 70–99)
Phosphorus: 5.9 mg/dL — ABNORMAL HIGH (ref 2.5–4.6)
Potassium: 5.8 mmol/L — ABNORMAL HIGH (ref 3.5–5.1)
Sodium: 137 mmol/L (ref 135–145)

## 2019-09-07 LAB — POCT I-STAT 7, (LYTES, BLD GAS, ICA,H+H)
Acid-Base Excess: 0 mmol/L (ref 0.0–2.0)
Bicarbonate: 23.3 mmol/L (ref 20.0–28.0)
Calcium, Ion: 1.19 mmol/L (ref 1.15–1.40)
HCT: 23 % — ABNORMAL LOW (ref 39.0–52.0)
Hemoglobin: 7.8 g/dL — ABNORMAL LOW (ref 13.0–17.0)
O2 Saturation: 99 %
Patient temperature: 100
Potassium: 5.6 mmol/L — ABNORMAL HIGH (ref 3.5–5.1)
Sodium: 136 mmol/L (ref 135–145)
TCO2: 24 mmol/L (ref 22–32)
pCO2 arterial: 31.4 mmHg — ABNORMAL LOW (ref 32.0–48.0)
pH, Arterial: 7.481 — ABNORMAL HIGH (ref 7.350–7.450)
pO2, Arterial: 125 mmHg — ABNORMAL HIGH (ref 83.0–108.0)

## 2019-09-07 LAB — COMPREHENSIVE METABOLIC PANEL
ALT: 127 U/L — ABNORMAL HIGH (ref 0–44)
AST: 72 U/L — ABNORMAL HIGH (ref 15–41)
Albumin: 2.2 g/dL — ABNORMAL LOW (ref 3.5–5.0)
Alkaline Phosphatase: 193 U/L — ABNORMAL HIGH (ref 38–126)
Anion gap: 11 (ref 5–15)
BUN: 47 mg/dL — ABNORMAL HIGH (ref 8–23)
CO2: 22 mmol/L (ref 22–32)
Calcium: 8.5 mg/dL — ABNORMAL LOW (ref 8.9–10.3)
Chloride: 103 mmol/L (ref 98–111)
Creatinine, Ser: 3.68 mg/dL — ABNORMAL HIGH (ref 0.61–1.24)
GFR calc Af Amer: 18 mL/min — ABNORMAL LOW (ref >60)
GFR calc non Af Amer: 15 mL/min — ABNORMAL LOW (ref >60)
Glucose, Bld: 116 mg/dL — ABNORMAL HIGH (ref 70–99)
Potassium: 6.2 mmol/L — ABNORMAL HIGH (ref 3.5–5.1)
Sodium: 136 mmol/L (ref 135–145)
Total Bilirubin: 1.1 mg/dL (ref 0.3–1.2)
Total Protein: 5.5 g/dL — ABNORMAL LOW (ref 6.5–8.1)

## 2019-09-07 LAB — GLUCOSE, CAPILLARY
Glucose-Capillary: 104 mg/dL — ABNORMAL HIGH (ref 70–99)
Glucose-Capillary: 118 mg/dL — ABNORMAL HIGH (ref 70–99)
Glucose-Capillary: 124 mg/dL — ABNORMAL HIGH (ref 70–99)
Glucose-Capillary: 136 mg/dL — ABNORMAL HIGH (ref 70–99)
Glucose-Capillary: 143 mg/dL — ABNORMAL HIGH (ref 70–99)
Glucose-Capillary: 143 mg/dL — ABNORMAL HIGH (ref 70–99)
Glucose-Capillary: 175 mg/dL — ABNORMAL HIGH (ref 70–99)
Glucose-Capillary: 89 mg/dL (ref 70–99)

## 2019-09-07 LAB — PHOSPHORUS: Phosphorus: 5.3 mg/dL — ABNORMAL HIGH (ref 2.5–4.6)

## 2019-09-07 LAB — CBC
HCT: 26.6 % — ABNORMAL LOW (ref 39.0–52.0)
Hemoglobin: 8.2 g/dL — ABNORMAL LOW (ref 13.0–17.0)
MCH: 30.9 pg (ref 26.0–34.0)
MCHC: 30.8 g/dL (ref 30.0–36.0)
MCV: 100.4 fL — ABNORMAL HIGH (ref 80.0–100.0)
Platelets: 203 10*3/uL (ref 150–400)
RBC: 2.65 MIL/uL — ABNORMAL LOW (ref 4.22–5.81)
RDW: 21.7 % — ABNORMAL HIGH (ref 11.5–15.5)
WBC: 14.3 10*3/uL — ABNORMAL HIGH (ref 4.0–10.5)
nRBC: 0.5 % — ABNORMAL HIGH (ref 0.0–0.2)

## 2019-09-07 LAB — MAGNESIUM: Magnesium: 2.5 mg/dL — ABNORMAL HIGH (ref 1.7–2.4)

## 2019-09-07 LAB — HEPATITIS B SURFACE ANTIGEN: Hepatitis B Surface Ag: NONREACTIVE

## 2019-09-07 LAB — APTT: aPTT: 37 seconds — ABNORMAL HIGH (ref 24–36)

## 2019-09-07 MED ORDER — CHLORHEXIDINE GLUCONATE CLOTH 2 % EX PADS: 6.0000 | MEDICATED_PAD | Freq: Every day | CUTANEOUS | Status: DC

## 2019-09-07 MED ORDER — ALBUMIN HUMAN 25 % IV SOLN
25.0000 g | Freq: Once | INTRAVENOUS | Status: AC
  Administered 2019-09-07: 25 g via INTRAVENOUS
  Filled 2019-09-07: qty 100

## 2019-09-07 MED ORDER — VANCOMYCIN HCL 750 MG/150ML IV SOLN
750.0000 mg | Freq: Once | INTRAVENOUS | Status: AC
  Administered 2019-09-07: 750 mg via INTRAVENOUS
  Filled 2019-09-07 (×2): qty 150

## 2019-09-07 MED ORDER — SODIUM ZIRCONIUM CYCLOSILICATE 5 G PO PACK
5.0000 g | PACK | Freq: Once | ORAL | Status: AC
  Administered 2019-09-07: 5 g via ORAL
  Filled 2019-09-07: qty 1

## 2019-09-07 MED ORDER — ACETAMINOPHEN 325 MG PO TABS
650.0000 mg | ORAL_TABLET | Freq: Four times a day (QID) | ORAL | Status: DC | PRN
  Administered 2019-09-07 – 2019-09-30 (×9): 650 mg via ORAL
  Filled 2019-09-07 (×9): qty 2

## 2019-09-07 NOTE — Progress Notes (Signed)
Hyperkalemia:  On renal panel K+ 6.2.  Patient has not had any arrhythmias overnight. Paged to nephrology for treatment. Lokelma 5 gm ordered to be given and address with rounding team.

## 2019-09-07 NOTE — Progress Notes (Signed)
1 Day Post-Op  Subjective: Slightly confused this AM on evaluation. Nursing staff planning to transport to MRI for brain scan due to decreased ROM of LUE and inconsistent command following.  He denies any pain in midline chest, non verbal due to trach. Communicates via nods.   Objective: Vital signs in last 24 hours: Temp:  [99 F (37.2 C)-100.9 F (38.3 C)] 99.5 F (37.5 C) (05/13 0800) Pulse Rate:  [88-109] 103 (05/13 0700) Resp:  [16-21] 18 (05/13 0700) BP: (84-127)/(55-79) 103/63 (05/13 0330) SpO2:  [98 %-100 %] 100 % (05/13 0700) Arterial Line BP: (77-156)/(45-65) 99/49 (05/13 0700) FiO2 (%):  [30 %] 30 % (05/13 0335) Weight:  [84 kg] 84 kg (05/13 0400) Last BM Date: 09/05/19  Intake/Output from previous day: 05/12 0701 - 05/13 0700 In: 3043.7 [I.V.:1460; NG/GT:1190; IV Piggyback:393.7] Out: 501 [Drains:130; Blood:50] Intake/Output this shift: No intake/output data recorded.  General appearance: alert, no distress and poor command following Head: Normocephalic, without obvious abnormality, atraumatic, trach in place Eyes: conjunctivae clear Resp: on vent Chest wall: TTP, HD line in place, wound vac in place Neurologic: Communicating via head nodding, inconsistent command following. Incision/Wound: Prevena vac in place - good seal noted, abra straps in place along lower sternal incision, no peri-incisional erythema noted. Mild TTP. No drainage in canister noted. JP drain in place.  Lab Results:  CBC Latest Ref Rng & Units 09/07/2019 09/07/2019 09/06/2019  WBC 4.0 - 10.5 K/uL 14.3(H) - 14.2(H)  Hemoglobin 13.0 - 17.0 g/dL 8.2(L) 7.8(L) 8.6(L)  Hematocrit 39.0 - 52.0 % 26.6(L) 23.0(L) 27.7(L)  Platelets 150 - 400 K/uL 203 - 207    BMET Recent Labs    09/06/19 1648 09/06/19 1648 09/07/19 0029 09/07/19 0223  NA 138   < > 136 136  K 5.1   < > 5.6* 6.2*  CL 105  --   --  103  CO2 24  --   --  22  GLUCOSE 127*  --   --  116*  BUN 32*  --   --  47*  CREATININE 2.60*   --   --  3.68*  CALCIUM 8.6*  --   --  8.5*   < > = values in this interval not displayed.   PT/INR No results for input(s): LABPROT, INR in the last 72 hours. ABG Recent Labs    09/07/19 0029  PHART 7.481*  HCO3 23.3    Studies/Results: DG Chest Port 1 View  Result Date: 09/06/2019 CLINICAL DATA:  Recent sternotomy revision EXAM: PORTABLE CHEST 1 VIEW COMPARISON:  09/06/2019 FINDINGS: Tracheostomy tube, feeding catheter, right jugular central line left-sided temporary dialysis catheter are noted and stable. New postoperative changes are noted in the midline consistent with the recent sternal revision. Right-sided thoracostomy catheter is noted. No pneumothorax is seen. Mild left basilar atelectasis is noted slightly increased from the prior study. IMPRESSION: Tubes and lines as described above. Recent postsurgical changes. Mild left basilar atelectasis increased from the prior exam. Electronically Signed   By: Inez Catalina M.D.   On: 09/06/2019 19:50   DG CHEST PORT 1 VIEW  Result Date: 09/06/2019 CLINICAL DATA:  Status post CABG. EXAM: PORTABLE CHEST 1 VIEW COMPARISON:  Chest x-ray dated Sep 04, 2019. FINDINGS: Unchanged tracheostomy and feeding tubes. Unchanged left subclavian central venous catheter and right-sided chest tube. Interval removal of the mediastinal drain with new mediastinal wound VAC. Stable cardiomegaly status post CABG. Normal pulmonary vascularity. Unchanged left basilar atelectasis and small left pleural effusion. Unchanged  1.4 cm nodule in the peripheral right upper lobe. No pneumothorax. No acute osseous abnormality. IMPRESSION: 1. Stable chest. Unchanged left basilar atelectasis and small left pleural effusion. Electronically Signed   By: Titus Dubin M.D.   On: 09/06/2019 08:29    Anti-infectives: Anti-infectives (From admission, onward)   Start     Dose/Rate Route Frequency Ordered Stop   09/06/19 2200  meropenem (MERREM) 500 mg in sodium chloride 0.9 % 100  mL IVPB     500 mg 200 mL/hr over 30 Minutes Intravenous Every 24 hours 09/06/19 1542     09/06/19 2000  fluconazole (DIFLUCAN) IVPB 400 mg     400 mg 100 mL/hr over 120 Minutes Intravenous Every 24 hours 09/06/19 1542     09/06/19 1800  vancomycin (VANCOREADY) IVPB 750 mg/150 mL     750 mg 150 mL/hr over 60 Minutes Intravenous  Once 09/06/19 1542 09/07/19 0022   09/06/19 1540  vancomycin variable dose per unstable renal function (pharmacist dosing)      Does not apply See admin instructions 09/06/19 1542     09/06/19 1100  ceFAZolin (ANCEF) IVPB 2g/100 mL premix  Status:  Discontinued     2 g 200 mL/hr over 30 Minutes Intravenous 30 min pre-op 09/05/19 1717 09/06/19 1644   09/06/19 0600  ceFAZolin (ANCEF) IVPB 2g/100 mL premix  Status:  Discontinued     2 g 200 mL/hr over 30 Minutes Intravenous To Short Stay 09/06/19 0546 09/06/19 1644   09/02/19 2200  meropenem (MERREM) 1 g in sodium chloride 0.9 % 100 mL IVPB  Status:  Discontinued     1 g 200 mL/hr over 30 Minutes Intravenous Every 8 hours 09/02/19 1011 09/06/19 1542   09/02/19 2000  fluconazole (DIFLUCAN) IVPB 800 mg  Status:  Discontinued     800 mg 100 mL/hr over 240 Minutes Intravenous Every 24 hours 09/02/19 1011 09/06/19 1542   09/02/19 2000  fluconazole (DIFLUCAN) IVPB 800 mg  Status:  Discontinued     800 mg 200 mL/hr over 120 Minutes Intravenous Every 24 hours 09/02/19 1329 09/02/19 1330   09/02/19 1800  vancomycin (VANCOCIN) IVPB 1000 mg/200 mL premix  Status:  Discontinued     1,000 mg 200 mL/hr over 60 Minutes Intravenous Every 24 hours 09/02/19 1011 09/06/19 1542   09/02/19 0900  meropenem (MERREM) 1 g in sodium chloride 0.9 % 100 mL IVPB  Status:  Discontinued     1 g 200 mL/hr over 30 Minutes Intravenous Every 24 hours 09/01/19 1333 09/02/19 1011   09/01/19 1700  fluconazole (DIFLUCAN) IVPB 800 mg     800 mg 100 mL/hr over 240 Minutes Intravenous  Once 09/01/19 1645 09/01/19 2057   09/01/19 1630  fluconazole  (DIFLUCAN) IVPB 400 mg  Status:  Discontinued     400 mg 100 mL/hr over 120 Minutes Intravenous  Once 09/01/19 1629 09/01/19 1645   09/01/19 1330  vancomycin variable dose per unstable renal function (pharmacist dosing)  Status:  Discontinued      Does not apply See admin instructions 09/01/19 1333 09/02/19 1325   09/01/19 0730  meropenem (MERREM) 1 g in sodium chloride 0.9 % 100 mL IVPB  Status:  Discontinued     1 g 200 mL/hr over 30 Minutes Intravenous Every 8 hours 09/01/19 0729 09/01/19 1327   08/31/19 2200  vancomycin (VANCOCIN) IVPB 1000 mg/200 mL premix  Status:  Discontinued     1,000 mg 200 mL/hr over 60 Minutes Intravenous Every 24 hours  08/30/19 2235 09/01/19 1330   08/25/19 0800  vancomycin (VANCOREADY) IVPB 1250 mg/250 mL  Status:  Discontinued     1,250 mg 166.7 mL/hr over 90 Minutes Intravenous Every 24 hours 08/24/19 2121 08/24/19 2128   08/25/19 0100  vancomycin (VANCOREADY) IVPB 1250 mg/250 mL  Status:  Discontinued     1,250 mg 166.7 mL/hr over 90 Minutes Intravenous Every 24 hours 08/24/19 2128 08/30/19 2235   08/24/19 2300  ceFEPIme (MAXIPIME) 2 g in sodium chloride 0.9 % 100 mL IVPB  Status:  Discontinued     2 g 200 mL/hr over 30 Minutes Intravenous Every 12 hours 08/24/19 2150 09/01/19 0729   08/24/19 1338  vancomycin (VANCOCIN) 1,000 mg in sodium chloride 0.9 % 1,000 mL irrigation  Status:  Discontinued       As needed 08/24/19 1338 08/24/19 1508   08/21/19 0000  ceFEPIme (MAXIPIME) 1 g in sodium chloride 0.9 % 100 mL IVPB  Status:  Discontinued     1 g 200 mL/hr over 30 Minutes Intravenous Every 24 hours 08/20/19 0954 08/24/19 2150   08/20/19 1200  vancomycin (VANCOCIN) IVPB 1000 mg/200 mL premix     1,000 mg 200 mL/hr over 60 Minutes Intravenous  Once 08/20/19 0954 08/21/19 0011   08/19/19 2129  vancomycin variable dose per unstable renal function (pharmacist dosing)  Status:  Discontinued      Does not apply See admin instructions 08/19/19 2129 08/24/19 2128    08/19/19 0000  ceFEPIme (MAXIPIME) 2 g in sodium chloride 0.9 % 100 mL IVPB  Status:  Discontinued     2 g 200 mL/hr over 30 Minutes Intravenous Every 24 hours 08/18/19 2351 08/20/19 0954   08/19/19 0000  vancomycin (VANCOREADY) IVPB 2000 mg/400 mL     2,000 mg 200 mL/hr over 120 Minutes Intravenous  Once 08/18/19 2351 08/19/19 0354   08/16/19 1731  vancomycin (VANCOCIN) powder  Status:  Discontinued       As needed 08/16/19 1732 08/16/19 1808   08/15/19 2045  vancomycin (VANCOCIN) IVPB 1000 mg/200 mL premix     1,000 mg 200 mL/hr over 60 Minutes Intravenous  Once 08/15/19 1402 08/15/19 2209   08/15/19 1645  cefUROXime (ZINACEF) 1.5 g in sodium chloride 0.9 % 100 mL IVPB     1.5 g 200 mL/hr over 30 Minutes Intravenous Every 12 hours 08/15/19 1402 08/17/19 0700   08/15/19 0958  vancomycin (VANCOCIN) powder  Status:  Discontinued       As needed 08/15/19 1000 08/15/19 1402   08/15/19 0400  vancomycin (VANCOREADY) IVPB 1500 mg/300 mL     1,500 mg 150 mL/hr over 120 Minutes Intravenous To Surgery 08/14/19 1121 08/15/19 0830   08/15/19 0400  cefUROXime (ZINACEF) 1.5 g in sodium chloride 0.9 % 100 mL IVPB     1.5 g 200 mL/hr over 30 Minutes Intravenous To Surgery 08/14/19 1121 08/15/19 0812   08/15/19 0400  cefUROXime (ZINACEF) 750 mg in sodium chloride 0.9 % 100 mL IVPB     750 mg 200 mL/hr over 30 Minutes Intravenous To Surgery 08/14/19 1121 08/15/19 1300      Assessment/Plan: s/p Procedure(s): MEDIASTINAL EXPLORATION STERNAL PLATING LAPAROSCOPIC OMENTAL HARVEST APPLICATION OF ACELL, APPLICATION OF WOUND VAC USING PREVENA INCISIONAL  DRESSING Pectoralis ADVANCEMENT Flap  Prevena vac in place - no drainage in canister - will keep in place for up to 1 week pending any changes. ABRA straps in place - will not advance at this time. JP drain in place -  will continue to follow output and plan removal in ~ 1 week pending output.  Change in mental status this AM, patient going for  imaging.  Will continue to follow.   LOS: 29 days    Charlies Constable, PA-C 09/07/2019

## 2019-09-07 NOTE — Progress Notes (Signed)
Pt placed back on full support for night time rest. 

## 2019-09-07 NOTE — Progress Notes (Addendum)
Patient ID: Christian [name], male   DOB: Aug 26, 1945, 74 y.o.   MRN: [id number]     Advanced Heart Failure Rounding Note  PCP-Cardiologist: No primary care provider on file.   Subjective:    08/09/19 Admitted with NSTEM and ? Small RLL PE. Weight 213 pounds.  08/15/19 CABG x4 on 08/15/19 08/18/19 Cardiac arrest due to tamponade. Chest opened for washout 08/20/19 Echo post-pericardial evacuation: EF 40-45% with some residual pericardial clot. 4/26 Chest washout in OR 4/29 CVVH started 5/5 OR for chest washout, tracheostomy 5/7 HD catheter replaced 5/10 OR for washout/dressing change.  5/12 To OR for chest closure with muscle flap  Remains on vent through trach.    Trach aspirate + rare pseudomonas  WBC 21k -> 16->10.9.   Heparin off except for CRT   Denies pain. Follows commands but not moving left foot.  Left hand very weak.    Objective:   Weight Range: 84 kg Body mass index is 27.35 kg/m.   Vital Signs:   Temp:  [98.2 F (36.8 C)-100.9 F (38.3 C)] 100.9 F (38.3 C) (05/13 0400) Pulse Rate:  [88-109] 103 (05/13 0700) Resp:  [16-21] 18 (05/13 0700) BP: (84-127)/(55-79) 103/63 (05/13 0330) SpO2:  [98 %-100 %] 100 % (05/13 0700) Arterial Line BP: (77-156)/(45-65) 99/49 (05/13 0700) FiO2 (%):  [30 %] 30 % (05/13 0335) Weight:  [84 kg] 84 kg (05/13 0400) Last BM Date: 09/05/19  Weight change: Filed Weights   09/03/19 0421 09/04/19 0454 09/07/19 0400  Weight: 89 kg 85.3 kg 84 kg    Intake/Output:   Intake/Output Summary (Last 24 hours) at 09/07/2019 0748 Last data filed at 09/07/2019 0618 Gross per 24 hour  Intake 3043.65 ml  Output 501 ml  Net 2542.65 ml      Physical Exam  General:  Awake on Vent through Trach No resp difficulty HEENT: normal Neck: supple. no JVD. Carotids 2+ bilat; no bruits. No lymphadenopathy or thryomegaly appreciated. Cor: PMI nondisplaced. Regular rate & rhythm. No rubs, gallops or murmurs. Sternal VAC Lungs: clear Abdomen: soft,  nontender, nondistended. No hepatosplenomegaly. No bruits or masses. Good bowel sounds. Extremities: no cyanosis, clubbing, rash, edema. LUE dressing.  Neuro: alert.   Follows commands. Not moving LUE or L foot.   Telemetry   NSR 90-100s  Labs    CBC Recent Labs    09/06/19 1648 09/06/19 1648 09/07/19 0029 09/07/19 0223  WBC 14.2*  --   --  14.3*  HGB 8.6*   < > 7.8* 8.2*  HCT 27.7*   < > 23.0* 26.6*  MCV 98.6  --   --  100.4*  PLT 207  --   --  203   < > = values in this interval not displayed.   Basic Metabolic Panel Recent Labs    09/06/19 0404 09/06/19 0404 09/06/19 1648 09/06/19 1648 09/07/19 0029 09/07/19 0223  NA 138   < > 138   < > 136 136  K 4.5   < > 5.1   < > 5.6* 6.2*  CL 102   < > 105  --   --  103  CO2 26   < > 24  --   --  22  GLUCOSE 155*   < > 127*  --   --  116*  BUN 28*   < > 32*  --   --  47*  CREATININE 1.92*   < > 2.60*  --   --  3.68*  CALCIUM 8.5*   < >  8.6*  --   --  8.5*  MG 2.5*  --   --   --   --  2.5*  PHOS 2.7   < > 4.5  --   --  5.3*   < > = values in this interval not displayed.   Liver Function Tests Recent Labs    09/06/19 1648 09/07/19 0223  AST  --  72*  ALT  --  127*  ALKPHOS  --  193*  BILITOT  --  1.1  PROT  --  5.5*  ALBUMIN 2.3* 2.2*   No results for input(s): LIPASE, AMYLASE in the last 72 hours. Cardiac Enzymes No results for input(s): CKTOTAL, CKMB, CKMBINDEX, TROPONINI in the last 72 hours.  BNP: BNP (last 3 results) Recent Labs    08/09/19 1240 09/02/19 0311  BNP 89.1 761.0*    ProBNP (last 3 results) Recent Labs    11/23/18 1120 08/09/19 0922  PROBNP 18.0 78.0     D-Dimer No results for input(s): DDIMER in the last 72 hours. Hemoglobin A1C No results for input(s): HGBA1C in the last 72 hours. Fasting Lipid Panel No results for input(s): CHOL, HDL, LDLCALC, TRIG, CHOLHDL, LDLDIRECT in the last 72 hours. Thyroid Function Tests No results for input(s): TSH, T4TOTAL, T3FREE, THYROIDAB in  the last 72 hours.  Invalid input(s): FREET3  Other results:   Imaging    DG Chest Port 1 View  Result Date: 09/06/2019 CLINICAL DATA:  Recent sternotomy revision EXAM: PORTABLE CHEST 1 VIEW COMPARISON:  09/06/2019 FINDINGS: Tracheostomy tube, feeding catheter, right jugular central line left-sided temporary dialysis catheter are noted and stable. New postoperative changes are noted in the midline consistent with the recent sternal revision. Right-sided thoracostomy catheter is noted. No pneumothorax is seen. Mild left basilar atelectasis is noted slightly increased from the prior study. IMPRESSION: Tubes and lines as described above. Recent postsurgical changes. Mild left basilar atelectasis increased from the prior exam. Electronically Signed   By: Inez Catalina M.D.   On: 09/06/2019 19:50     Medications:     Scheduled Medications: . sodium chloride   Intravenous Once  . sodium chloride   Intravenous Once  . amiodarone  200 mg Per Tube BID  . aspirin  81 mg Per Tube Daily  . atorvastatin  40 mg Per Tube Daily  . B-complex with vitamin C  1 tablet Per Tube Daily  . bisacodyl  10 mg Oral Daily   Or  . bisacodyl  10 mg Rectal Daily  . chlorhexidine gluconate (MEDLINE KIT)  15 mL Mouth Rinse BID  . Chlorhexidine Gluconate Cloth  6 each Topical Daily  . Chlorhexidine Gluconate Cloth  6 each Topical Q0600  . clopidogrel  75 mg Per Tube Daily  . darbepoetin (ARANESP) injection - NON-DIALYSIS  150 mcg Subcutaneous Q Mon-1800  . docusate  100 mg Per Tube BID  . feeding supplement (PRO-STAT SUGAR FREE 64)  60 mL Per Tube TID  . insulin aspart  0-24 Units Subcutaneous Q4H  . insulin detemir  10 Units Subcutaneous BID  . mouth rinse  15 mL Mouth Rinse 10 times per day  . midodrine  10 mg Oral TID WC  . pantoprazole sodium  40 mg Per Tube Daily  . sertraline  25 mg Oral Daily  . sodium chloride flush  10-40 mL Intracatheter Q12H  . sodium chloride flush  10-40 mL Intracatheter Q12H    . trospium  20 mg Per Tube BID  .  vancomycin variable dose per unstable renal function (pharmacist dosing)   Does not apply See admin instructions    Infusions: .  prismasol BGK 4/2.5 500 mL/hr at 09/05/19 1418  .  prismasol BGK 4/2.5 300 mL/hr at 09/04/19 1800  . sodium chloride Stopped (09/01/19 1008)  . feeding supplement (VITAL 1.5 CAL) Stopped (09/06/19 1900)  . fluconazole (DIFLUCAN) IV Stopped (09/06/19 2249)  . heparin 10,000 units/ 20 mL infusion syringe 1,000 Units/hr (09/06/19 0522)  . lactated ringers 20 mL/hr at 08/20/19 1200  . meropenem (MERREM) IV Stopped (09/06/19 2222)  . norepinephrine (LEVOPHED) Adult infusion Stopped (09/02/19 0647)  . prismasol BGK 4/2.5 1,500 mL/hr at 09/06/19 1000  . vasopressin (PITRESSIN) infusion - *FOR SHOCK* Stopped (09/02/19 1634)    PRN Medications: acetaminophen, ALPRAZolam, cromolyn, dextrose, heparin, heparin, hydrALAZINE, HYDROmorphone (DILAUDID) injection, ondansetron (ZOFRAN) IV, oxyCODONE, sodium chloride flush, sodium chloride flush     Assessment/Plan   1. CAD: s/p PDA angioplasty + stent in 02/2014.  Admitted for NSTEMI 4/14. Hs Trop peaked at 3,309.  LHC w/ severe 3V CAD. Echo w/ reduced EF 30-35%. RV ok.  S/p CABG x 4 on 4/20 (LIMA-LAD, RIMA to PDA, Lt radial artery graft to 1st OM and distal OM branches of LCx).  Chest reopened urgently at the bedside for cardiac tamponade and removal of clot on 4/23  - Has been on ASA. Continue plavix as he will need anticoagulation long-term.  - Atorvastatin 40 daily.   - S/P muscle flap by plastic surgery  2. Acute Systolic Heart Failure: Ischemic Cardiomyopathy -> cardiogenic shock.  LVEF severely reduced at 30-35%, in the setting of NSTEM/ Multivessel CAD, RV ok initially.  Underwent urgent sternotomy at the bedside for cardiac tamponade and removal of clot on 4/23. Chest remains open. Luiz Blare numbers have been concerning for RV failure (swan now out).  Echo 4/24 EF 40 to 45% with  inotropic support, small amount of residual pericardial clot, RV moderately hypokinetic.  He has been getting CVVH, looks euvolemic.   - Off all pressors.  - Start iHD today.  3.  Postoperative acute hypoxic respiratory failure: Reintubated on 4/23 due to cardiogenic shock and return to OR. Now with tracheostomy, FiO2 30% .  -  Per CCM.  4. AKI: Baseline SCr 0.9. Creatinine peaked to 5.3, suspect ATN in setting of cardiogenic shock/tamponade.  - Start iHD today.  - K high, treated overnight, recheck this morning.  5. Shock liver: Due to cardiac arrest on 4/23.  LFTs trending down.  6. 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 Maintaining NSR with PACs.   - Continue amiodarone per tube. - Off heparin due to bleeding. Will need to start [place] at appropriate interval after today's surgery.  7.  Possible small right lower lobe PE: Chest CT 4/14 w/ small RLL PE.  Bilateral Venous Dopplers negative for DVT.  - Off heparin due to bleeding. Start DOAC at appropriate interval after today's surgery.  8. T2DM: Hgb A1c 7.7 - Remains on insulin 9. Pericardial tamponade: 4/23 return to OR for clot removal, chest wall left open. Patient had been on heparin gtt for DCCV of atrial flutter. Returned to the OR 5/10 for washout.   5/12 back to OR for muscle flap and chest closure.  10. ID: Has been on vancomycin/cefepime with open chest. Cefepime switched to meropenem on 5/7 WBCs rising. Concern for infection/developing sepsis.  Blood cultures negative so far. Trach aspirate + rare pseudomonas => Pseudomonas tracheitis versus PNA. HD catheter changed  5/7.  - Continue vancomycin/meropenem/fluconazole, per CCM.   13. Post-op anemia: Now stable.  14. LUE Deficit- check MRI of brain. He has been on and off heparin for surgeries.    Amy Clegg NP-C  09/07/2019 7:48 AM  Patient seen with NP, agree with the above note.   Stable on vent via trach today.  Chest closed with muscle flap yesterday.  He is unable to  move left foot today, can move left hand slightly. He is in NSR.   General: NAD Neck: No JVD, no thyromegaly or thyroid nodule.  Lungs: Decreased at bases.  CV: Nondisplaced PMI.  Heart regular S1/S2, no S3/S4, no murmur.  No peripheral edema.   Abdomen: Soft, nontender, no hepatosplenomegaly, no distention.  Skin: Intact without lesions or rashes.  Neurologic: Alert.  Can move right side normally.  Left hand weak, does not move left foot.   Extremities: No clubbing or cyanosis.  HEENT: Normal.   Left-sided weakness noted.  Concern for CVA.   - MRI head today for CVA. - Discussed with Dr. Servando Snare => if evidence for ischemic stroke, will need to involve neurology and start anticoagulation today (PAF).  If no evidence for stroke, probably hold off on anticoagulation until tomorrow.   He remains in NSR on po amiodarone.   K elevated this morning.  Repeat now after [facility] => if high, may need to restart on CVVH.  If normal, can wait for iHD later today.   CRITICAL CARE Performed by: Loralie Champagne  Total critical care time: 35 minutes  Critical care time was exclusive of separately billable procedures and treating other patients.  Critical care was necessary to treat or prevent imminent or life-threatening deterioration.  Critical care was time spent personally by me on the following activities: development of treatment plan with patient and/or surrogate as well as nursing, discussions with consultants, evaluation of patient's response to treatment, examination of patient, obtaining history from patient or surrogate, ordering and performing treatments and interventions, ordering and review of laboratory studies, ordering and review of radiographic studies, pulse oximetry and re-evaluation of patient's condition.  Loralie Champagne 09/07/2019 8:04 AM

## 2019-09-07 NOTE — Progress Notes (Signed)
CT surgery p.m. Rounds  Patient had stable day Sternal wound VAC compressed with minimal drainage Hemodynamic stable Patient still not moving left arm, head CT today unremarkable  Today's Vitals   09/07/19 1600 09/07/19 1700 09/07/19 1938 09/07/19 1943  BP: 130/68 (!) 105/58    Pulse: 98 100    Resp: (!) 21 (!) 23    Temp:   98.3 F (36.8 C)   TempSrc:   Oral   SpO2: 100% 100%  100%  Weight:      Height:      PainSc:       Body mass index is 27.28 kg/m.

## 2019-09-07 NOTE — Progress Notes (Signed)
PT Cancellation Note  Patient Details Name: Christian [name] MRN: [id number] DOB: [birth year]   Cancelled Treatment:    Reason Eval/Treat Not Completed: Patient at procedure or test/unavailable(HD)   [name] [name] 09/07/2019, 10:39 AM  [name], PT Acute Rehabilitation Services Pager: [phone] Office: [phone]

## 2019-09-07 NOTE — Progress Notes (Signed)
Pt transported to CT on vent without complication  

## 2019-09-07 NOTE — Progress Notes (Signed)
Patient ID: Christian [name], male   DOB: 07/17/1945, 74 y.o.   MRN: [id number]   S:  They were able to achieve chest closure yesterday-  I left off of CRRT overnight.  K has drifted up overnight-  6.2 this AM given lokelma-  Hemodynamically stable- bladder scan 150 so very minimal UOP     O:BP 103/63   Pulse (!) 103   Temp (!) 100.9 F (38.3 C) (Oral)   Resp 18   Ht 5' 9"  (1.753 m)   Wt 84 kg   SpO2 100%   BMI 27.35 kg/m   Intake/Output Summary (Last 24 hours) at 09/07/2019 0709 Last data filed at 09/07/2019 0618 Gross per 24 hour  Intake 3043.65 ml  Output 501 ml  Net 2542.65 ml   Intake/Output: I/O last 3 completed shifts: In: 3766.8 [I.V.:1550; NG/GT:1440; IV Piggyback:776.8] Out: 7322 [Drains:160; GURKY:7062; Stool:900; Blood:50; Chest Tube:20]  Intake/Output this shift:  No intake/output data recorded. Weight change:  Gen:  Alert on vent via trach left subclavian HD cath placed 5/7 CVS: RRR, no rub, open chest wound with wound vac in place Resp: cta Abd: distended, +BS, soft, NT  Ext: 1+ edema  Recent Labs  Lab 09/02/19 0311 09/02/19 1530 09/04/19 0358 09/04/19 0358 09/04/19 1600 09/05/19 0434 09/05/19 1630 09/06/19 0404 09/06/19 1648 09/07/19 0029 09/07/19 0223  NA 137   < > 139   < > 137 139 139 138 138 136 136  K 3.9   < > 4.4   < > 5.0 4.8 4.3 4.5 5.1 5.6* 6.2*  CL 104   < > 103  --  103 105 102 102 105  --  103  CO2 20*   < > 25  --  23 25 26 26 24   --  22  GLUCOSE 238*   < > 82  --  191* 172* 164* 155* 127*  --  116*  BUN 87*   < > 32*  --  35* 34* 30* 28* 32*  --  47*  CREATININE 4.33*   < > 2.24*  --  2.53* 2.12* 1.94* 1.92* 2.60*  --  3.68*  ALBUMIN 2.1*   < > 2.0*  --  2.3* 2.2* 2.4* 2.4* 2.3*  --  2.2*  CALCIUM 8.2*   < > 8.0*  --  8.1* 7.9* 8.4* 8.5* 8.6*  --  8.5*  PHOS  --    < > 2.9  --  3.3 2.3* 3.5 2.7 4.5  --  5.3*  AST 38  --   --   --   --   --   --   --   --   --  72*  ALT 49*  --   --   --   --   --   --   --   --   --  127*   < > =  values in this interval not displayed.   Liver Function Tests: Recent Labs  Lab 09/02/19 0311 09/02/19 1530 09/06/19 0404 09/06/19 1648 09/07/19 0223  AST 38  --   --   --  72*  ALT 49*  --   --   --  127*  ALKPHOS 104  --   --   --  193*  BILITOT 1.0  --   --   --  1.1  PROT 5.1*  --   --   --  5.5*  ALBUMIN 2.1*   < > 2.4* 2.3* 2.2*   < > =  values in this interval not displayed.   No results for input(s): LIPASE, AMYLASE in the last 168 hours. No results for input(s): AMMONIA in the last 168 hours. CBC: Recent Labs  Lab 09/01/19 0349 09/01/19 1758 09/02/19 0311 09/02/19 0945 09/04/19 0358 09/04/19 0358 09/05/19 9935 09/05/19 7017 09/06/19 0404 09/06/19 0404 09/06/19 1648 09/07/19 0029 09/07/19 0223  WBC 33.6*  --  21.6*   < > 10.9*   < > 12.1*   < > 12.4*  --  14.2*  --  14.3*  NEUTROABS 25.7*  --  16.8*  --   --   --   --   --   --   --   --   --   --   HGB 7.4*   < > 6.4*   < > 8.5*   < > 7.4*   < > 8.7*   < > 8.6* 7.8* 8.2*  HCT 23.9*   < > 20.0*   < > 27.4*   < > 24.1*   < > 28.0*   < > 27.7* 23.0* 26.6*  MCV 98.0  --  96.2   < > 97.9  --  97.6  --  98.2  --  98.6  --  100.4*  PLT 265  --  184   < > 116*   < > 175   < > 200  --  207  --  203   < > = values in this interval not displayed.   Cardiac Enzymes: No results for input(s): CKTOTAL, CKMB, CKMBINDEX, TROPONINI in the last 168 hours. CBG: Recent Labs  Lab 09/06/19 0741 09/06/19 1134 09/06/19 2027 09/07/19 0024 09/07/19 0426  GLUCAP 120* 136* 125* 118* 124*    Iron Studies: No results for input(s): IRON, TIBC, TRANSFERRIN, FERRITIN in the last 72 hours. Studies/Results: DG Chest Port 1 View  Result Date: 09/06/2019 CLINICAL DATA:  Recent sternotomy revision EXAM: PORTABLE CHEST 1 VIEW COMPARISON:  09/06/2019 FINDINGS: Tracheostomy tube, feeding catheter, right jugular central line left-sided temporary dialysis catheter are noted and stable. New postoperative changes are noted in the midline  consistent with the recent sternal revision. Right-sided thoracostomy catheter is noted. No pneumothorax is seen. Mild left basilar atelectasis is noted slightly increased from the prior study. IMPRESSION: Tubes and lines as described above. Recent postsurgical changes. Mild left basilar atelectasis increased from the prior exam. Electronically Signed   By: Inez Catalina M.D.   On: 09/06/2019 19:50   DG CHEST PORT 1 VIEW  Result Date: 09/06/2019 CLINICAL DATA:  Status post CABG. EXAM: PORTABLE CHEST 1 VIEW COMPARISON:  Chest x-ray dated Sep 04, 2019. FINDINGS: Unchanged tracheostomy and feeding tubes. Unchanged left subclavian central venous catheter and right-sided chest tube. Interval removal of the mediastinal drain with new mediastinal wound VAC. Stable cardiomegaly status post CABG. Normal pulmonary vascularity. Unchanged left basilar atelectasis and small left pleural effusion. Unchanged 1.4 cm nodule in the peripheral right upper lobe. No pneumothorax. No acute osseous abnormality. IMPRESSION: 1. Stable chest. Unchanged left basilar atelectasis and small left pleural effusion. Electronically Signed   By: Titus Dubin M.D.   On: 09/06/2019 08:29   . sodium chloride   Intravenous Once  . sodium chloride   Intravenous Once  . amiodarone  200 mg Per Tube BID  . aspirin  81 mg Per Tube Daily  . atorvastatin  40 mg Per Tube Daily  . B-complex with vitamin C  1 tablet Per Tube Daily  . bisacodyl  10 mg Oral Daily   Or  . bisacodyl  10 mg Rectal Daily  . chlorhexidine gluconate (MEDLINE KIT)  15 mL Mouth Rinse BID  . Chlorhexidine Gluconate Cloth  6 each Topical Daily  . clopidogrel  75 mg Per Tube Daily  . darbepoetin (ARANESP) injection - NON-DIALYSIS  150 mcg Subcutaneous Q Mon-1800  . docusate  100 mg Per Tube BID  . feeding supplement (PRO-STAT SUGAR FREE 64)  60 mL Per Tube TID  . insulin aspart  0-24 Units Subcutaneous Q4H  . insulin detemir  10 Units Subcutaneous BID  . mouth rinse   15 mL Mouth Rinse 10 times per day  . midodrine  10 mg Oral TID WC  . pantoprazole sodium  40 mg Per Tube Daily  . sertraline  25 mg Oral Daily  . sodium chloride flush  10-40 mL Intracatheter Q12H  . sodium chloride flush  10-40 mL Intracatheter Q12H  . trospium  20 mg Per Tube BID  . vancomycin variable dose per unstable renal function (pharmacist dosing)   Does not apply See admin instructions    BMET    Component Value Date/Time   NA 136 09/07/2019 0223   NA 141 06/19/2019 1130   K 6.2 (H) 09/07/2019 0223   CL 103 09/07/2019 0223   CO2 22 09/07/2019 0223   GLUCOSE 116 (H) 09/07/2019 0223   BUN 47 (H) 09/07/2019 0223   BUN 21 06/19/2019 1130   CREATININE 3.68 (H) 09/07/2019 0223   CALCIUM 8.5 (L) 09/07/2019 0223   GFRNONAA 15 (L) 09/07/2019 0223   GFRAA 18 (L) 09/07/2019 0223   CBC    Component Value Date/Time   WBC 14.3 (H) 09/07/2019 0223   RBC 2.65 (L) 09/07/2019 0223   HGB 8.2 (L) 09/07/2019 0223   HCT 26.6 (L) 09/07/2019 0223   PLT 203 09/07/2019 0223   MCV 100.4 (H) 09/07/2019 0223   MCH 30.9 09/07/2019 0223   MCHC 30.8 09/07/2019 0223   RDW 21.7 (H) 09/07/2019 0223   LYMPHSABS 1.1 09/02/2019 0311   MONOABS 1.5 (H) 09/02/2019 0311   EOSABS 0.3 09/02/2019 0311   BASOSABS 0.1 09/02/2019 0311     Assessment/Plan:  1. Shock- improved now on meripenem and vanc.   2. AKI- (crt 1.22 on 07/24/19) following cardiac cath, cabg, cardiogenic shock, cardiac arrest due to tamponade, and acute on chronic CHF. Started CRRT on 08/24/19 due to anasarca and need for volume removal to close his open chest.  CRRT was held on 09/01/19 with line changes. 1. Resumed CRRT 09/02/19 and UF as tolerated. 2. Left subclavian trialysis catheter placed by PCCM on 5/7 3. Stopped CRRT yesterday in prep for chest closure 4. No UOP-  Unclear if recovery is possible given major insult 5. off CRRT prior to surgery. I felt If able to close chest and BP remained good would transition to IHD.  The  potassium may force our hand. I cannot do IHD until 1 PM today.  Will recheck potassium - if less than 6.2 will wait and do IHD at 1 PM.  If K if 6.5 or higher may need to put on CRRT for the day just to bring K down then transition to IHD tomorrow 3. 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 Acute systolic heart failure due to ischemic cardiomyopathy- volume markedly improved with CRRT. Will cont UF since tolerating and no pressors  5. Postoperative acute hypoxic respiratory failure- reintubated 08/18/19 and likely aspiration. Now trach 6. Anemia-  transfuse prn.added ESA- darbe 150 7. Ileus-  still distended. Per primary. 8. Elytes-  Phos is OK -  No action needed   Louis Meckel  [employer] [phone]

## 2019-09-07 NOTE — Progress Notes (Signed)
Patient ID: Christian [name], male   DOB: Sep 12, 1945, 74 y.o.   MRN: [id number]    1 Day Post-Op  Subjective: On vent.  Seems frightened every time he opens his eyes.  He will shake his head when you speak to him, but that doesn't correlate into following commands.  Planning an MRI of his head due to lack of movement from left-side, which is new apparently.  ROS: unable, on vent  Objective: Vital signs in last 24 hours: Temp:  [99 F (37.2 C)-100.9 F (38.3 C)] 99.5 F (37.5 C) (05/13 0800) Pulse Rate:  [88-109] 103 (05/13 0700) Resp:  [16-21] 18 (05/13 0700) BP: (84-127)/(55-79) 103/63 (05/13 0330) SpO2:  [98 %-100 %] 100 % (05/13 0700) Arterial Line BP: (77-156)/(45-65) 99/49 (05/13 0700) FiO2 (%):  [30 %] 30 % (05/13 0335) Weight:  [84 kg] 84 kg (05/13 0400) Last BM Date: 09/05/19  Intake/Output from previous day: 05/12 0701 - 05/13 0700 In: 3043.7 [I.V.:1460; NG/GT:1190; IV Piggyback:393.7] Out: 501 [Drains:130; Blood:50] Intake/Output this shift: No intake/output data recorded.  PE: Abd: soft, appropriately tender, JP drain in place in upper abdomen with serosang output.  Lap incision with staples in place and c/d/i Neuro: not consistent in following commands.  Very delayed will move his right hand only for me.  Lab Results:  Recent Labs    09/06/19 1648 09/06/19 1648 09/07/19 0029 09/07/19 0223  WBC 14.2*  --   --  14.3*  HGB 8.6*   < > 7.8* 8.2*  HCT 27.7*   < > 23.0* 26.6*  PLT 207  --   --  203   < > = values in this interval not displayed.   BMET Recent Labs    09/06/19 1648 09/06/19 1648 09/07/19 0029 09/07/19 0223  NA 138   < > 136 136  K 5.1   < > 5.6* 6.2*  CL 105  --   --  103  CO2 24  --   --  22  GLUCOSE 127*  --   --  116*  BUN 32*  --   --  47*  CREATININE 2.60*  --   --  3.68*  CALCIUM 8.6*  --   --  8.5*   < > = values in this interval not displayed.   PT/INR No results for input(s): LABPROT, INR in the last 72 hours. CMP       Component Value Date/Time   NA 136 09/07/2019 0223   NA 141 06/19/2019 1130   K 6.2 (H) 09/07/2019 0223   CL 103 09/07/2019 0223   CO2 22 09/07/2019 0223   GLUCOSE 116 (H) 09/07/2019 0223   BUN 47 (H) 09/07/2019 0223   BUN 21 06/19/2019 1130   CREATININE 3.68 (H) 09/07/2019 0223   CALCIUM 8.5 (L) 09/07/2019 0223   PROT 5.5 (L) 09/07/2019 0223   PROT 6.5 06/19/2019 1130   ALBUMIN 2.2 (L) 09/07/2019 0223   ALBUMIN 4.2 06/19/2019 1130   AST 72 (H) 09/07/2019 0223   ALT 127 (H) 09/07/2019 0223   ALKPHOS 193 (H) 09/07/2019 0223   BILITOT 1.1 09/07/2019 0223   BILITOT 0.3 06/19/2019 1130   GFRNONAA 15 (L) 09/07/2019 0223   GFRAA 18 (L) 09/07/2019 0223   Lipase  No results found for: LIPASE     Studies/Results: DG Chest Port 1 View  Result Date: 09/06/2019 CLINICAL DATA:  Recent sternotomy revision EXAM: PORTABLE CHEST 1 VIEW COMPARISON:  09/06/2019 FINDINGS: Tracheostomy tube, feeding catheter, right jugular  central line left-sided temporary dialysis catheter are noted and stable. New postoperative changes are noted in the midline consistent with the recent sternal revision. Right-sided thoracostomy catheter is noted. No pneumothorax is seen. Mild left basilar atelectasis is noted slightly increased from the prior study. IMPRESSION: Tubes and lines as described above. Recent postsurgical changes. Mild left basilar atelectasis increased from the prior exam. Electronically Signed   By: Inez Catalina M.D.   On: 09/06/2019 19:50   DG CHEST PORT 1 VIEW  Result Date: 09/06/2019 CLINICAL DATA:  Status post CABG. EXAM: PORTABLE CHEST 1 VIEW COMPARISON:  Chest x-ray dated Sep 04, 2019. FINDINGS: Unchanged tracheostomy and feeding tubes. Unchanged left subclavian central venous catheter and right-sided chest tube. Interval removal of the mediastinal drain with new mediastinal wound VAC. Stable cardiomegaly status post CABG. Normal pulmonary vascularity. Unchanged left basilar atelectasis and  small left pleural effusion. Unchanged 1.4 cm nodule in the peripheral right upper lobe. No pneumothorax. No acute osseous abnormality. IMPRESSION: 1. Stable chest. Unchanged left basilar atelectasis and small left pleural effusion. Electronically Signed   By: Titus Dubin M.D.   On: 09/06/2019 08:29    Anti-infectives: Anti-infectives (From admission, onward)   Start     Dose/Rate Route Frequency Ordered Stop   09/06/19 2200  meropenem (MERREM) 500 mg in sodium chloride 0.9 % 100 mL IVPB     500 mg 200 mL/hr over 30 Minutes Intravenous Every 24 hours 09/06/19 1542     09/06/19 2000  fluconazole (DIFLUCAN) IVPB 400 mg     400 mg 100 mL/hr over 120 Minutes Intravenous Every 24 hours 09/06/19 1542     09/06/19 1800  vancomycin (VANCOREADY) IVPB 750 mg/150 mL     750 mg 150 mL/hr over 60 Minutes Intravenous  Once 09/06/19 1542 09/07/19 0022   09/06/19 1540  vancomycin variable dose per unstable renal function (pharmacist dosing)      Does not apply See admin instructions 09/06/19 1542     09/06/19 1100  ceFAZolin (ANCEF) IVPB 2g/100 mL premix  Status:  Discontinued     2 g 200 mL/hr over 30 Minutes Intravenous 30 min pre-op 09/05/19 1717 09/06/19 1644   09/06/19 0600  ceFAZolin (ANCEF) IVPB 2g/100 mL premix  Status:  Discontinued     2 g 200 mL/hr over 30 Minutes Intravenous To Short Stay 09/06/19 0546 09/06/19 1644   09/02/19 2200  meropenem (MERREM) 1 g in sodium chloride 0.9 % 100 mL IVPB  Status:  Discontinued     1 g 200 mL/hr over 30 Minutes Intravenous Every 8 hours 09/02/19 1011 09/06/19 1542   09/02/19 2000  fluconazole (DIFLUCAN) IVPB 800 mg  Status:  Discontinued     800 mg 100 mL/hr over 240 Minutes Intravenous Every 24 hours 09/02/19 1011 09/06/19 1542   09/02/19 2000  fluconazole (DIFLUCAN) IVPB 800 mg  Status:  Discontinued     800 mg 200 mL/hr over 120 Minutes Intravenous Every 24 hours 09/02/19 1329 09/02/19 1330   09/02/19 1800  vancomycin (VANCOCIN) IVPB 1000 mg/200  mL premix  Status:  Discontinued     1,000 mg 200 mL/hr over 60 Minutes Intravenous Every 24 hours 09/02/19 1011 09/06/19 1542   09/02/19 0900  meropenem (MERREM) 1 g in sodium chloride 0.9 % 100 mL IVPB  Status:  Discontinued     1 g 200 mL/hr over 30 Minutes Intravenous Every 24 hours 09/01/19 1333 09/02/19 1011   09/01/19 1700  fluconazole (DIFLUCAN) IVPB 800 mg  800 mg 100 mL/hr over 240 Minutes Intravenous  Once 09/01/19 1645 09/01/19 2057   09/01/19 1630  fluconazole (DIFLUCAN) IVPB 400 mg  Status:  Discontinued     400 mg 100 mL/hr over 120 Minutes Intravenous  Once 09/01/19 1629 09/01/19 1645   09/01/19 1330  vancomycin variable dose per unstable renal function (pharmacist dosing)  Status:  Discontinued      Does not apply See admin instructions 09/01/19 1333 09/02/19 1325   09/01/19 0730  meropenem (MERREM) 1 g in sodium chloride 0.9 % 100 mL IVPB  Status:  Discontinued     1 g 200 mL/hr over 30 Minutes Intravenous Every 8 hours 09/01/19 0729 09/01/19 1327   08/31/19 2200  vancomycin (VANCOCIN) IVPB 1000 mg/200 mL premix  Status:  Discontinued     1,000 mg 200 mL/hr over 60 Minutes Intravenous Every 24 hours 08/30/19 2235 09/01/19 1330   08/25/19 0800  vancomycin (VANCOREADY) IVPB 1250 mg/250 mL  Status:  Discontinued     1,250 mg 166.7 mL/hr over 90 Minutes Intravenous Every 24 hours 08/24/19 2121 08/24/19 2128   08/25/19 0100  vancomycin (VANCOREADY) IVPB 1250 mg/250 mL  Status:  Discontinued     1,250 mg 166.7 mL/hr over 90 Minutes Intravenous Every 24 hours 08/24/19 2128 08/30/19 2235   08/24/19 2300  ceFEPIme (MAXIPIME) 2 g in sodium chloride 0.9 % 100 mL IVPB  Status:  Discontinued     2 g 200 mL/hr over 30 Minutes Intravenous Every 12 hours 08/24/19 2150 09/01/19 0729   08/24/19 1338  vancomycin (VANCOCIN) 1,000 mg in sodium chloride 0.9 % 1,000 mL irrigation  Status:  Discontinued       As needed 08/24/19 1338 08/24/19 1508   08/21/19 0000  ceFEPIme (MAXIPIME) 1 g  in sodium chloride 0.9 % 100 mL IVPB  Status:  Discontinued     1 g 200 mL/hr over 30 Minutes Intravenous Every 24 hours 08/20/19 0954 08/24/19 2150   08/20/19 1200  vancomycin (VANCOCIN) IVPB 1000 mg/200 mL premix     1,000 mg 200 mL/hr over 60 Minutes Intravenous  Once 08/20/19 0954 08/21/19 0011   08/19/19 2129  vancomycin variable dose per unstable renal function (pharmacist dosing)  Status:  Discontinued      Does not apply See admin instructions 08/19/19 2129 08/24/19 2128   08/19/19 0000  ceFEPIme (MAXIPIME) 2 g in sodium chloride 0.9 % 100 mL IVPB  Status:  Discontinued     2 g 200 mL/hr over 30 Minutes Intravenous Every 24 hours 08/18/19 2351 08/20/19 0954   08/19/19 0000  vancomycin (VANCOREADY) IVPB 2000 mg/400 mL     2,000 mg 200 mL/hr over 120 Minutes Intravenous  Once 08/18/19 2351 08/19/19 0354   08/16/19 1731  vancomycin (VANCOCIN) powder  Status:  Discontinued       As needed 08/16/19 1732 08/16/19 1808   08/15/19 2045  vancomycin (VANCOCIN) IVPB 1000 mg/200 mL premix     1,000 mg 200 mL/hr over 60 Minutes Intravenous  Once 08/15/19 1402 08/15/19 2209   08/15/19 1645  cefUROXime (ZINACEF) 1.5 g in sodium chloride 0.9 % 100 mL IVPB     1.5 g 200 mL/hr over 30 Minutes Intravenous Every 12 hours 08/15/19 1402 08/17/19 0700   08/15/19 0958  vancomycin (VANCOCIN) powder  Status:  Discontinued       As needed 08/15/19 1000 08/15/19 1402   08/15/19 0400  vancomycin (VANCOREADY) IVPB 1500 mg/300 mL     1,500 mg 150  mL/hr over 120 Minutes Intravenous To Surgery 08/14/19 1121 08/15/19 0830   08/15/19 0400  cefUROXime (ZINACEF) 1.5 g in sodium chloride 0.9 % 100 mL IVPB     1.5 g 200 mL/hr over 30 Minutes Intravenous To Surgery 08/14/19 1121 08/15/19 0812   08/15/19 0400  cefUROXime (ZINACEF) 750 mg in sodium chloride 0.9 % 100 mL IVPB     750 mg 200 mL/hr over 30 Minutes Intravenous To Surgery 08/14/19 1121 08/15/19 1300       Assessment/Plan POD 1, s/p laparoscopic  omental harvest and placement into mediastinum, LK 5/12 -from an abdominal standpoint, the patient is surgically stable. -defer VAC and chest care to primary service as well as plastics -may feed patient whenever primary service would like to start TFs, while on vent -defer further work up of possible CVA to primary service -we are available as needed.  FEN - NPO on vent VTE - Plavix, heparin with HD ID - Merrem/vanc   LOS: 29 days    Henreitta Cea , [facility] 09/07/2019, 8:14 AM Please see Amion for pager number during day hours 7:00am-4:30pm or 7:00am -11:30am on weekends

## 2019-09-07 NOTE — Progress Notes (Addendum)
Patient ID: Christian [name], male   DOB: 08-Apr-1946, 74 y.o.   MRN: [id number] [facility] DAILY ICU PROGRESS NOTE                   [address]            [place] [address]          [phone]   1 Day Post-Op Procedure(s) (LRB): MEDIASTINAL EXPLORATION (N/A) STERNAL PLATING (N/A) LAPAROSCOPIC OMENTAL HARVEST (N/A) APPLICATION OF ACELL, APPLICATION OF WOUND VAC USING PREVENA INCISIONAL  DRESSING (N/A) Pectoralis ADVANCEMENT Flap  Total Length of Stay:  LOS: 29 days   Subjective: Awake and follows commands   Objective: Vital signs in last 24 hours: Temp:  [98.2 F (36.8 C)-100.9 F (38.3 C)] 100.9 F (38.3 C) (05/13 0400) Pulse Rate:  [87-109] 105 (05/13 0445) Cardiac Rhythm: Sinus tachycardia (05/13 0000) Resp:  [16-21] 18 (05/13 0445) BP: (84-127)/(55-79) 103/63 (05/13 0330) SpO2:  [98 %-100 %] 100 % (05/13 0445) Arterial Line BP: (77-156)/(45-65) 107/53 (05/13 0445) FiO2 (%):  [30 %] 30 % (05/13 0335) Weight:  [84 kg] 84 kg (05/13 0400)  Filed Weights   09/03/19 0421 09/04/19 0454 09/07/19 0400  Weight: 89 kg 85.3 kg 84 kg    Weight change:    Hemodynamic parameters for last 24 hours:    Intake/Output from previous day: 05/12 0701 - 05/13 0700 In: 2923.7 [I.V.:1340; NG/GT:1190; IV Piggyback:393.7] Out: 501 [Drains:130; Blood:50]  Intake/Output this shift: Total I/O In: 603.7 [NG/GT:210; IV Piggyback:393.7] Out: 130 [Drains:130]  Current Meds: Scheduled Meds: . sodium chloride   Intravenous Once  . sodium chloride   Intravenous Once  . amiodarone  200 mg Per Tube BID  . aspirin  81 mg Per Tube Daily  . atorvastatin  40 mg Per Tube Daily  . B-complex with vitamin C  1 tablet Per Tube Daily  . bisacodyl  10 mg Oral Daily   Or  . bisacodyl  10 mg Rectal Daily  . chlorhexidine gluconate (MEDLINE KIT)  15 mL Mouth Rinse BID  . Chlorhexidine Gluconate Cloth  6 each Topical Daily  . clopidogrel  75 mg Per Tube Daily  . darbepoetin (ARANESP)  injection - NON-DIALYSIS  150 mcg Subcutaneous Q Mon-1800  . docusate  100 mg Per Tube BID  . feeding supplement (PRO-STAT SUGAR FREE 64)  60 mL Per Tube TID  . insulin aspart  0-24 Units Subcutaneous Q4H  . insulin detemir  10 Units Subcutaneous BID  . mouth rinse  15 mL Mouth Rinse 10 times per day  . midodrine  10 mg Oral TID WC  . pantoprazole sodium  40 mg Per Tube Daily  . sertraline  25 mg Oral Daily  . sodium chloride flush  10-40 mL Intracatheter Q12H  . sodium chloride flush  10-40 mL Intracatheter Q12H  . trospium  20 mg Per Tube BID  . vancomycin variable dose per unstable renal function (pharmacist dosing)   Does not apply See admin instructions   Continuous Infusions: .  prismasol BGK 4/2.5 500 mL/hr at 09/05/19 1418  .  prismasol BGK 4/2.5 300 mL/hr at 09/04/19 1800  . sodium chloride Stopped (09/01/19 1008)  . feeding supplement (VITAL 1.5 CAL) Stopped (09/06/19 1900)  . fluconazole (DIFLUCAN) IV Stopped (09/06/19 2249)  . heparin 10,000 units/ 20 mL infusion syringe 1,000 Units/hr (09/06/19 0522)  . lactated ringers 20 mL/hr at 08/20/19 1200  . meropenem (MERREM) IV Stopped (09/06/19 2222)  .  norepinephrine (LEVOPHED) Adult infusion Stopped (09/02/19 0647)  . prismasol BGK 4/2.5 1,500 mL/hr at 09/06/19 1000  . vasopressin (PITRESSIN) infusion - *FOR SHOCK* Stopped (09/02/19 1634)   PRN Meds:.acetaminophen, ALPRAZolam, cromolyn, dextrose, heparin, heparin, hydrALAZINE, HYDROmorphone (DILAUDID) injection, ondansetron (ZOFRAN) IV, oxyCODONE, sodium chloride flush, sodium chloride flush  General appearance: cooperative and no distress Neurologic: intact Heart: regular rate and rhythm, S1, S2 normal, no murmur, click, rub or gallop Lungs: diminished breath sounds bibasilar Abdomen: soft, non-tender; bowel sounds normal; no masses,  no organomegaly Extremities: extremities normal, atraumatic, no cyanosis or edema Wound: incision awound vac in place   Lab  Results: CBC: Recent Labs    09/06/19 1648 09/06/19 1648 09/07/19 0029 09/07/19 0223  WBC 14.2*  --   --  14.3*  HGB 8.6*   < > 7.8* 8.2*  HCT 27.7*   < > 23.0* 26.6*  PLT 207  --   --  203   < > = values in this interval not displayed.   BMET:  Recent Labs    09/06/19 1648 09/06/19 1648 09/07/19 0029 09/07/19 0223  NA 138   < > 136 136  K 5.1   < > 5.6* 6.2*  CL 105  --   --  103  CO2 24  --   --  22  GLUCOSE 127*  --   --  116*  BUN 32*  --   --  47*  CREATININE 2.60*  --   --  3.68*  CALCIUM 8.6*  --   --  8.5*   < > = values in this interval not displayed.    CMET: Lab Results  Component Value Date   WBC 14.3 (H) 09/07/2019   HGB 8.2 (L) 09/07/2019   HCT 26.6 (L) 09/07/2019   PLT 203 09/07/2019   GLUCOSE 116 (H) 09/07/2019   CHOL 107 08/11/2019   TRIG 71 08/27/2019   HDL 42 08/11/2019   LDLDIRECT 40.0 11/23/2018   LDLCALC 40 08/11/2019   ALT 127 (H) 09/07/2019   AST 72 (H) 09/07/2019   NA 136 09/07/2019   K 6.2 (H) 09/07/2019   CL 103 09/07/2019   CREATININE 3.68 (H) 09/07/2019   BUN 47 (H) 09/07/2019   CO2 22 09/07/2019   TSH 2.590 04/19/2019   INR 1.3 (H) 08/19/2019   HGBA1C 7.7 (H) 08/14/2019      PT/INR: No results for input(s): LABPROT, INR in the last 72 hours. Radiology: [facility] Chest Port 1 View  Result Date: 09/06/2019 CLINICAL DATA:  Recent sternotomy revision EXAM: PORTABLE CHEST 1 VIEW COMPARISON:  09/06/2019 FINDINGS: Tracheostomy tube, feeding catheter, right jugular central line left-sided temporary dialysis catheter are noted and stable. New postoperative changes are noted in the midline consistent with the recent sternal revision. Right-sided thoracostomy catheter is noted. No pneumothorax is seen. Mild left basilar atelectasis is noted slightly increased from the prior study. IMPRESSION: Tubes and lines as described above. Recent postsurgical changes. Mild left basilar atelectasis increased from the prior exam. Electronically Signed   By:  Inez Catalina M.D.   On: 09/06/2019 19:50     Assessment/Plan: S/P Procedure(s) (LRB): MEDIASTINAL EXPLORATION (N/A) STERNAL PLATING (N/A) LAPAROSCOPIC OMENTAL HARVEST (N/A) APPLICATION OF ACELL, APPLICATION OF WOUND VAC USING PREVENA INCISIONAL  DRESSING (N/A) Pectoralis ADVANCEMENT Flap Mobilize Resume tube feeding Renal aware of k 6.2 for dialysis today     Grace Istvan 09/07/2019 6:50 AM  Addendum : As people have been in and out of room patient  more alert - appears to be weaker on left side  Will not hold either arm up , flaccid and falls without resistance With painful stimuli will move right hand - but not left Dicussed with Dr Aundra Dubin  - MRI of brain Had been on anticoagulation week ago for poss PE- soft finding stopped due to dropping hct Has been holding sinus and was in sinus through the case yesterday   Grace Jmichael MD

## 2019-09-07 NOTE — Progress Notes (Signed)
PT Cancellation Note  Patient Details Name: Christian [name] MRN: [id number] DOB: Apr 02, 1946   Cancelled Treatment:    Reason Eval/Treat Not Completed: Patient at procedure or test/unavailable(pt going to MRI)   [place] 09/07/2019, 8:27 AM  Christian [name], PT Acute Rehabilitation Services Pager: [phone] Office: [phone]

## 2019-09-07 NOTE — Progress Notes (Signed)
OT Cancellation Note  Patient Details Name: SHAMAL STRACENER MRN: [id number] DOB: 09-17-1945   Cancelled Treatment:    Reason Eval/Treat Not Completed: Patient at procedure or test/ unavailable. Attempted to see earlier. Pt in MRI. Attempted to see this pm, pt in HD. Will follow up later date.   Ramond Dial, OT/L   Acute OT Clinical Specialist Acute Rehabilitation Services Pager [phone] Office [phone]  09/07/2019, 2:45 PM

## 2019-09-08 ENCOUNTER — Inpatient Hospital Stay (HOSPITAL_COMMUNITY): Payer: Medicare Other

## 2019-09-08 DIAGNOSIS — I639 Cerebral infarction, unspecified: Secondary | ICD-10-CM

## 2019-09-08 LAB — GLUCOSE, CAPILLARY
Glucose-Capillary: 106 mg/dL — ABNORMAL HIGH (ref 70–99)
Glucose-Capillary: 170 mg/dL — ABNORMAL HIGH (ref 70–99)
Glucose-Capillary: 175 mg/dL — ABNORMAL HIGH (ref 70–99)
Glucose-Capillary: 177 mg/dL — ABNORMAL HIGH (ref 70–99)
Glucose-Capillary: 187 mg/dL — ABNORMAL HIGH (ref 70–99)
Glucose-Capillary: 206 mg/dL — ABNORMAL HIGH (ref 70–99)

## 2019-09-08 LAB — CBC
HCT: 21 % — ABNORMAL LOW (ref 39.0–52.0)
HCT: 21.7 % — ABNORMAL LOW (ref 39.0–52.0)
HCT: 22.6 % — ABNORMAL LOW (ref 39.0–52.0)
Hemoglobin: 6.5 g/dL — CL (ref 13.0–17.0)
Hemoglobin: 6.7 g/dL — CL (ref 13.0–17.0)
Hemoglobin: 6.8 g/dL — CL (ref 13.0–17.0)
MCH: 31.1 pg (ref 26.0–34.0)
MCH: 31 pg (ref 26.0–34.0)
MCH: 30.5 pg (ref 26.0–34.0)
MCHC: 31 g/dL (ref 30.0–36.0)
MCHC: 30.9 g/dL (ref 30.0–36.0)
MCHC: 30.1 g/dL (ref 30.0–36.0)
MCV: 100.5 fL — ABNORMAL HIGH (ref 80.0–100.0)
MCV: 100.5 fL — ABNORMAL HIGH (ref 80.0–100.0)
MCV: 101.3 fL — ABNORMAL HIGH (ref 80.0–100.0)
Platelets: 186 10*3/uL (ref 150–400)
Platelets: 201 10*3/uL (ref 150–400)
Platelets: 214 10*3/uL (ref 150–400)
RBC: 2.09 MIL/uL — ABNORMAL LOW (ref 4.22–5.81)
RBC: 2.16 MIL/uL — ABNORMAL LOW (ref 4.22–5.81)
RBC: 2.23 MIL/uL — ABNORMAL LOW (ref 4.22–5.81)
RDW: 22.2 % — ABNORMAL HIGH (ref 11.5–15.5)
RDW: 22.6 % — ABNORMAL HIGH (ref 11.5–15.5)
RDW: 22.4 % — ABNORMAL HIGH (ref 11.5–15.5)
WBC: 9.2 10*3/uL (ref 4.0–10.5)
WBC: 8.6 10*3/uL (ref 4.0–10.5)
WBC: 8.5 10*3/uL (ref 4.0–10.5)
nRBC: 0 % (ref 0.0–0.2)
nRBC: 0 % (ref 0.0–0.2)
nRBC: 0 % (ref 0.0–0.2)

## 2019-09-08 LAB — RENAL FUNCTION PANEL
Albumin: 2.2 g/dL — ABNORMAL LOW (ref 3.5–5.0)
Anion gap: 11 (ref 5–15)
BUN: 50 mg/dL — ABNORMAL HIGH (ref 8–23)
CO2: 25 mmol/L (ref 22–32)
Calcium: 8.1 mg/dL — ABNORMAL LOW (ref 8.9–10.3)
Chloride: 101 mmol/L (ref 98–111)
Creatinine, Ser: 3.62 mg/dL — ABNORMAL HIGH (ref 0.61–1.24)
GFR calc Af Amer: 18 mL/min — ABNORMAL LOW (ref >60)
GFR calc non Af Amer: 16 mL/min — ABNORMAL LOW (ref >60)
Glucose, Bld: 175 mg/dL — ABNORMAL HIGH (ref 70–99)
Phosphorus: 5.3 mg/dL — ABNORMAL HIGH (ref 2.5–4.6)
Potassium: 4 mmol/L (ref 3.5–5.1)
Sodium: 137 mmol/L (ref 135–145)

## 2019-09-08 LAB — MAGNESIUM: Magnesium: 2.1 mg/dL (ref 1.7–2.4)

## 2019-09-08 LAB — APTT: aPTT: 41 seconds — ABNORMAL HIGH (ref 24–36)

## 2019-09-08 LAB — PREPARE RBC (CROSSMATCH)

## 2019-09-08 MED ORDER — CHLORHEXIDINE GLUCONATE CLOTH 2 % EX PADS
6.0000 | MEDICATED_PAD | Freq: Every day | CUTANEOUS | Status: DC
  Administered 2019-09-09 – 2019-09-20 (×11): 6 via TOPICAL

## 2019-09-08 MED ORDER — SODIUM CHLORIDE 0.9 % IV SOLN: 100.0000 mL | INTRAVENOUS | Status: DC | PRN

## 2019-09-08 MED ORDER — PENTAFLUOROPROP-TETRAFLUOROETH EX AERO: 1.0000 "application " | INHALATION_SPRAY | CUTANEOUS | Status: DC | PRN

## 2019-09-08 MED ORDER — SODIUM CHLORIDE 0.9% IV SOLUTION: Freq: Once | INTRAVENOUS | Status: AC

## 2019-09-08 MED ORDER — HEPARIN SODIUM (PORCINE) 1000 UNIT/ML DIALYSIS
1000.0000 [IU] | INTRAMUSCULAR | Status: DC | PRN
  Administered 2019-09-17: 3000 [IU] via INTRAVENOUS_CENTRAL
  Filled 2019-09-08 (×2): qty 1

## 2019-09-08 MED ORDER — LIDOCAINE HCL (PF) 1 % IJ SOLN: 5.0000 mL | INTRAMUSCULAR | Status: DC | PRN

## 2019-09-08 MED ORDER — STROKE: EARLY STAGES OF RECOVERY BOOK
Freq: Once | Status: DC
  Filled 2019-09-08: qty 1

## 2019-09-08 MED ORDER — ALTEPLASE 2 MG IJ SOLR
2.0000 mg | Freq: Once | INTRAMUSCULAR | Status: DC | PRN
  Filled 2019-09-08 (×2): qty 2

## 2019-09-08 MED ORDER — LIDOCAINE-PRILOCAINE 2.5-2.5 % EX CREA: 1.0000 "application " | TOPICAL_CREAM | CUTANEOUS | Status: DC | PRN

## 2019-09-08 NOTE — Op Note (Signed)
NAME: Christian [name], Christian [name] MEDICAL RECORD TJ:[id number] ACCOUNT [id number] DATE OF BIRTH:27-Aug-1945 FACILITY: [facility] LOCATION: [facility] PHYSICIAN:Dell Briner Maryruth Bun, MD  OPERATIVE REPORT  DATE OF PROCEDURE:  09/06/2019  PREOPERATIVE DIAGNOSIS:  Need for closure of sternum.  POSTOPERATIVE DIAGNOSIS:  Need for closure of sternum.  SURGICAL PROCEDURE:  Mediastinal exploration with omental flap to anterior mediastinum and sternal plating and closure of mediastinum and mediastinal sternal incision done in combination with Dr. Marla Roe plastic surgery and Dr. Gasper Sells, general  surgery.  BRIEF HISTORY:  The patient presents for closure of the mediastinal wound.  Several days prior the wound was investigated and irrigated and debrided in the operating room and determined that the patient was not going to tolerate closure and it was  decided to close the mediastinum with omental flap and advancement of pectoralis flaps.  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  The procedure was discussed with the patient, his wife and  brother.  He was agreeable.  His wife signed informed consent for him.  DESCRIPTION OF PROCEDURE:  The patient previously had tracheostomy done.  He was brought to the operating room and placed in supine position.  Anesthesia was administered.  Appropriate timeout was performed.  The chest and abdomen was prepped with  Betadine, draped in the usual sterile manner.  The previously placed wound VAC was removed.  We then proceeded as dictated by Dr. Gasper Sells of general surgery, laparoscopic harvesting of the omentum.  With a significant pedicle of omentum mobilized a  small incision was made at the lower end of the sternal incision and through this, the omentum was passed and carefully pulled from the abdominal cavity and positioned into the mediastinum between the bone edges.  The omentum was tacked down with  interrupted  3-0 Vicryl sutures.  The bone edges were curetted slightly.  The lower bone that was concern of the blood supply to the marrow, the upper part had started granulating.  We then selected 2 H-shaped rib plating prostheses and gently pushing the  sternum together slightly, placed a lower one between the 2 edges of the sternum and secured in place with 13 mm self-tapping screws 4 on each side of the sternum.  A 2nd similar H-shaped plate was placed at approximately the lower manubrium to  stabilize the sternum.  Dr. Marla Roe then proceeded with mobilizing a right pectoralis muscle advancement flap as described in her note.  With the muscle flap in place, ACell placed, the incisions were closed primarily.  A drain was under the right  pectoralis flap in skin.  Sponge and needle count was reported as correct at the completion and at portions of the procedure.  The patient was then transferred to the surgical intensive care unit with tracheostomy in place.  Blood loss was minimal.  He  did not require blood transfusion.   CN/NUANCE  D:09/07/2019 T:09/07/2019 JOB:[id number]

## 2019-09-08 NOTE — Consult Note (Addendum)
NEURO HOSPITALIST  CONSULT   Requesting Physician: Dr. Orvan Seen     Chief Complaint:  Left side weakness  History obtained from:  Chart review HPI:                                                                                                                                         Christian [name] is an 74 y.o. male  With PMH CAD, HTN, HLD, DM who presented initially on 4/14 for NSTEMI. Neurology consulted on 5/14 for  Left side weakness.   Patient is trached and vented, but able to respond to yes/no questions. Per patient his left arm feels heavy and weaker than his right. Doesn't really feel like his left leg is weaker than his right leg. Per chart: it was noted that yesterday that he was not moving his LUE or left foot.   Hospital course:  08/09/19 admitted for NSTEMI 4/20 CABG x4 4/23 cardiac arrest d/t tamponade 5/5 OR chest washout and tracheostomy placement 5/7 HD catheter placed 5/12 chest closure and muscle flap 5/13 left side weakness noted CTH: no acute abnormality; no visible infarct 5/14 neuro consulted      Past Medical History:  Diagnosis Date  . BPH (benign prostatic hypertrophy)   . Coronary artery disease   . Diabetes mellitus without complication ([place])   . Dry eyes left  . Hiatal hernia   . 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  LIMA TO LAD, RIMA TO PDA, RADIAL ARTERY TO CIRC AND SEQUENTIALLY TO OM1. 08/15/2019  . Hyperlipidemia   . Hypertension   . Incomplete bladder emptying   . Nocturia   . 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 SOB (shortness of breath) on exertion     Past Surgical History:  Procedure Laterality Date  . APPLICATION OF WOUND VAC N/A 08/24/2019   Procedure: APPLICATION OF WOUND VAC;  Surgeon: [name], MD;  Location: [place];  Service:  Thoracic;  Laterality: N/A;  . APPLICATION OF WOUND VAC  08/29/2019   Procedure: Wound Vac change;  Surgeon: [name], MD;  Location: [place] [facility];  Service: Open Heart Surgery;;  . APPLICATION OF WOUND VAC N/A 09/04/2019   Procedure: Application Of Wound Vac;  Surgeon: Grace Tanyon, MD;  Location: [place];  Service: Open Heart Surgery;  Laterality: N/A;  . APPLICATION OF WOUND VAC N/A 09/06/2019   Procedure: APPLICATION OF ACELL, APPLICATION OF WOUND VAC USING PREVENA INCISIONAL  DRESSING;  Surgeon: Wallace Going, DO;  Location: [place];  Service: Plastics;  Laterality: N/A;  . CARDIAC CATHETERIZATION    . CORONARY ARTERY BYPASS GRAFT N/A 08/15/2019   Procedure: CORONARY ARTERY BYPASS GRAFTING (CABG), x 4 using bilateral IMAs and left radial artery .  LIMA TO LAD, RIMA TO PDA, RADIAL ARTERY TO CIRC AND SEQUENTIALLY TO OM1.;  Surgeon: [name], MD;  Location: [place];  Service: Open Heart Surgery;  Laterality: N/A;  . CORONARY STENT PLACEMENT  02/27/2014   distal rt/pd coronary       dr Einar Gip  . CYSTO/ BLADDER BIOPSY'S/ CAUTHERIZATION  01-14-2004  DR Gaynelle Arabian  . EXPLORATION POST OPERATIVE OPEN HEART N/A 08/16/2019   Procedure: Chest Closure S?P CABG WITH APPLICATION OF PREVENA  INCISIONAL WOUND VAC;  Surgeon: [name], MD;  [facility];  Service: Open Heart Surgery;  Laterality: N/A;  . EXPLORATION POST OPERATIVE OPEN HEART N/A 08/21/2019   Procedure: CHEST WASHOUT S/P OPEN CHEST;  Surgeon: [name], MD;  Location: [place];  Service: Open Heart Surgery;  Laterality: N/A;  Open chest with Esmark dressing with Ioban sealant coverage.  . EXPLORATION POST OPERATIVE OPEN HEART N/A 08/18/2019   Procedure: EXPLORATION POST OPERATIVE OPEN HEART (performed 04/23 on unit);  Surgeon: [name], MD;  Location: [place];  Service: Open Heart Surgery;  Laterality: N/A;  . EXPLORATION POST OPERATIVE OPEN HEART N/A 08/24/2019   Procedure: CHEST WASHOUT POST OPERATIVE OPEN  HEART;  Surgeon: [name], MD;  Location: [place];  Service: Open Heart Surgery;  Laterality: N/A;  . EXPLORATION POST OPERATIVE OPEN HEART N/A 08/29/2019   Procedure: CHEST WOUND WASHOUT POST OPERATIVE OPEN HEART;  Surgeon: [name], MD;  Location: [place];  Service: Open Heart Surgery;  Laterality: N/A;  . EXPLORATION POST OPERATIVE OPEN HEART N/A 09/04/2019   Procedure: MEDIASTINAL EXPLORATION WITH STERNAL WOUND IRRIGATION;  Surgeon: Grace Harol, MD;  Location: [place];  Service: Open Heart Surgery;  Laterality: N/A;  . LAPAROSCOPIC LYSIS OF ADHESIONS N/A 09/06/2019   Procedure: LAPAROSCOPIC OMENTAL HARVEST;  Surgeon: Kinsinger, Arta Bruce, MD;  Location: [place];  Service: General;  Laterality: N/A;  . LEFT HEART CATH AND CORONARY ANGIOGRAPHY N/A 08/10/2019   Procedure: LEFT HEART CATH AND CORONARY ANGIOGRAPHY;  Surgeon: Nigel Mormon, MD;  Location: [place] [facility];  Service: Cardiovascular;  Laterality: N/A;  . LEFT HEART CATHETERIZATION WITH CORONARY ANGIOGRAM N/A 02/27/2014   Procedure: LEFT HEART CATHETERIZATION WITH CORONARY ANGIOGRAM;  Surgeon: Laverda Page, MD;  Location: [facility];  Service: Cardiovascular;  Laterality: N/A;  . MEDIASTINAL EXPLORATION N/A 09/06/2019   Procedure: MEDIASTINAL EXPLORATION;  Surgeon: Grace Jamael, MD;  Location: [place];  Service: Thoracic;  Laterality: N/A;  . PECTORALIS FLAP  09/06/2019   Procedure: Pectoralis ADVANCEMENT Flap;  Surgeon: Wallace Going, DO;  Location: [place];  Service: Plastics;;  . PERCUTANEOUS CORONARY STENT INTERVENTION (PCI-S)  02/27/2014   Procedure: PERCUTANEOUS CORONARY STENT INTERVENTION (PCI-S);  Surgeon: Laverda Page, MD;  Location: [facility] C[facility];  Service: Cardiovascular;;  rt PDA  3.0/28mm Promus stent  . RADIAL ARTERY HARVEST Left 08/15/2019   Procedure: Radial Artery Harvest;  Surgeon: [name], MD;  Location: [place];  Service: Open Heart Surgery;  Laterality: Left;  . RIB  PLATING N/A 09/06/2019   Procedure: STERNAL PLATING;  Surgeon: Grace Jatin, MD;  Location: [place];  Service: Thoracic;  Laterality: N/A;  .  TEE WITHOUT CARDIOVERSION N/A 08/15/2019   Procedure: TRANSESOPHAGEAL ECHOCARDIOGRAM (TEE);  Surgeon: [name], MD;  Location: [place];  Service: Open Heart Surgery;  Laterality: N/A;  . TRACHEOSTOMY TUBE PLACEMENT  08/29/2019   Procedure: Tracheostomy;  Surgeon: [name], MD;  Location: [place] [facility];  Service: Open Heart Surgery;;  . TRANSURETHRAL RESECTION OF PROSTATE  04/04/2012   Procedure: TRANSURETHRAL RESECTION OF THE PROSTATE WITH GYRUS INSTRUMENTS;  Surgeon: Ailene Rud, MD;  Location: [place];  Service: Urology;  Laterality: N/A;  . TRANSURETHRAL RESECTION OF PROSTATE N/A 09/27/2014   Procedure: TRANSURETHRAL RESECTION OF THE PROSTATE ;  Surgeon: Carolan Clines, MD;  Location: [facility];  Service: Urology;  Laterality: N/A;  . UPPER GASTROINTESTINAL ENDOSCOPY      Family History  Problem Relation Age of Onset  . Diabetes Mother   . Hypertension Mother   . Diabetes Father   . Diabetes Brother   . Hypertension Brother   . Diabetes Brother          Social History:  reports that he quit smoking about 29 years ago. His smoking use included cigarettes. He has a 10.00 pack-year smoking history. He has never used smokeless tobacco. He reports current alcohol use. He reports that he does not use drugs.  Allergies: No Known Allergies  Medications:                                                                                                                           Scheduled: . sodium chloride   Intravenous Once  . sodium chloride   Intravenous Once  . amiodarone  200 mg Per Tube BID  . aspirin  81 mg Per Tube Daily  . atorvastatin  40 mg Per Tube Daily  . B-complex with vitamin C  1 tablet Per Tube Daily  . bisacodyl  10 mg Oral Daily   Or  . bisacodyl  10 mg Rectal Daily  . chlorhexidine gluconate  (MEDLINE KIT)  15 mL Mouth Rinse BID  . Chlorhexidine Gluconate Cloth  6 each Topical Daily  . Chlorhexidine Gluconate Cloth  6 each Topical Q0600  . clopidogrel  75 mg Per Tube Daily  . darbepoetin (ARANESP) injection - NON-DIALYSIS  150 mcg Subcutaneous Q Mon-1800  . docusate  100 mg Per Tube BID  . feeding supplement (PRO-STAT SUGAR FREE 64)  60 mL Per Tube TID  . insulin aspart  0-24 Units Subcutaneous Q4H  . insulin detemir  10 Units Subcutaneous BID  . mouth rinse  15 mL Mouth Rinse 10 times per day  . midodrine  10 mg Oral TID WC  . pantoprazole sodium  40 mg Per Tube Daily  . sertraline  25 mg Oral Daily  . sodium chloride flush  10-40 mL Intracatheter Q12H  . sodium chloride flush  10-40 mL Intracatheter Q12H  . trospium  20 mg Per Tube BID  . vancomycin variable dose per unstable renal function (  pharmacist dosing)   Does not apply See admin instructions   Continuous: . sodium chloride 250 mL (09/07/19 2033)  . feeding supplement (VITAL 1.5 CAL) Stopped (09/06/19 1900)  . fluconazole (DIFLUCAN) IV Stopped (09/07/19 2237)  . lactated ringers 20 mL/hr at 08/20/19 1200  . meropenem [facility]) IV Stopped (09/07/19 2144)   ZOX:[id number], ALPRAZolam, cromolyn, dextrose, heparin, heparin, hydrALAZINE, HYDROmorphone (DILAUDID) injection, ondansetron (ZOFRAN) IV, oxyCODONE, sodium chloride flush, sodium chloride flush   ROS:                                                                                                                                       Unable to obtain d/t trach    General Examination:                                                                                                      Blood pressure (!) 144/64, pulse 100, temperature 98.9 F (37.2 C), temperature source Oral, resp. rate (!) 31, height 5' 9" (1.753 m), weight 87.1 kg, SpO2 100 %.  Physical Exam  Constitutional: Appears well-developed and well-nourished.  Psych: Affect appropriate to  situation Eyes: Normal external eye and conjunctiva. HENT: Normocephalic, no lesions, without obvious abnormality.   Musculoskeletal-no joint tenderness, deformity or swelling Cardiovascular: Normal rate and regular rhythm.  Respiratory: Effort normal, non-labored breathing saturations WNL GI: Soft.  No distension. There is no tenderness.  Skin: WDI  Neurological Examination Mental Status: Alert, oriented name (shook his head yes), able to follow some simple commands. Patient attempts to verbalize, but is trached so unable to understand what he is trying to say. Cranial Nerves: II: blinks to threat bilaterally, EOEMI no nystagmus.  III,IV, VI: ptosis not present, extra-ocular motions intact bilaterally, pupils equal, round, reactive to light and , face appears symmetric. Tongue protrudes midline Motor: Unable to raise any of the 4 extremities anti gravity at the shoulder, but able to lift at the wrist. Able to twist BLE and wiggle toes on both sides as well as flex and extend feet bilaterally. LUE able to give weak thumbs up with weak hand grips. RUE thumbs up, and right hand grip is stronger than left but still classified as weak hand grip.  Tone and bulk:normal tone throughout; no atrophy noted Sensory:  light touch intact throughout, bilaterally Deep Tendon Reflexes: patella reflexes are diminished Cerebellar: Unable to perform Gait: deferred   Lab Results: Basic Metabolic Panel: Recent Labs  Lab 09/04/19 0358 09/04/19 1600 09/05/19 0434  09/05/19 1630 09/06/19 0404 09/06/19 0404 09/06/19 1648 09/06/19 1648 09/07/19 0029 09/07/19 0223 09/07/19 0707 09/08/19 0314  NA 139   < > 139   < > 138   < > 138  --  136 136 137 137  K 4.4   < > 4.8   < > 4.5   < > 5.1  --  5.6* 6.2* 5.8* 4.0  CL 103   < > 105   < > 102  --  105  --   --  103 104 101  CO2 25   < > 25   < > 26  --  24  --   --  22 21* 25  GLUCOSE 82   < > 172*   < > 155*  --  127*  --   --  116* 112* 175*  BUN 32*    < > 34*   < > 28*  --  32*  --   --  47* 52* 50*  CREATININE 2.24*   < > 2.12*   < > 1.92*  --  2.60*  --   --  3.68* 4.32* 3.62*  CALCIUM 8.0*   < > 7.9*   < > 8.5*   < > 8.6*   < >  --  8.5* 8.8* 8.1*  MG 2.5*  --  2.4  --  2.5*  --   --   --   --  2.5*  --  2.1  PHOS 2.9   < > 2.3*   < > 2.7  --  4.5  --   --  5.3* 5.9* 5.3*   < > = values in this interval not displayed.    CBC: Recent Labs  Lab 09/02/19 0311 09/02/19 0945 09/05/19 2505 09/05/19 0812 09/06/19 0404 09/06/19 1648 09/07/19 0029 09/07/19 0223 09/08/19 0314  WBC 21.6*   < > 12.1*  --  12.4* 14.2*  --  14.3* 9.2  NEUTROABS 16.8*  --   --   --   --   --   --   --   --   HGB 6.4*   < > 7.4*   < > 8.7* 8.6* 7.8* 8.2* 6.5*  HCT 20.0*   < > 24.1*   < > 28.0* 27.7* 23.0* 26.6* 21.0*  MCV 96.2   < > 97.6  --  98.2 98.6  --  100.4* 100.5*  PLT 184   < > 175  --  200 207  --  203 186   < > = values in this interval not displayed.     CBG: Recent Labs  Lab 09/07/19 1152 09/07/19 1540 09/07/19 1937 09/07/19 2346 09/08/19 0319  GLUCAP 143* 89 175* 143* 170*    Imaging: CT HEAD WO CONTRAST  Result Date: 09/07/2019 CLINICAL DATA:  Stroke follow-up.  New left-sided paralysis. EXAM: CT HEAD WITHOUT CONTRAST TECHNIQUE: Contiguous axial images were obtained from the base of the skull through the vertex without intravenous contrast. COMPARISON:  08/26/2019 FINDINGS: Brain: No evidence of acute infarction, hemorrhage, obstructive hydrocephalus, extra-axial collection or mass lesion/mass effect. Generalized atrophy and ventriculomegaly. Vascular: No hyperdense vessel or unexpected calcification. Skull: Normal. Negative for fracture or focal lesion. Sinuses/Orbits: Negative IMPRESSION: 1. No acute finding.  No visible infarct. 2. Atrophy and ventriculomegaly. Electronically Signed   By: Monte Fantasia M.D.   On: 09/07/2019 09:22   DG Chest Port 1 View  Result Date: 09/08/2019 CLINICAL DATA:  Status post CABG and mediastinal  washout. EXAM: PORTABLE CHEST 1 VIEW COMPARISON:  Chest x-ray from yesterday. FINDINGS: Unchanged tracheostomy and feeding tubes. Unchanged right internal jugular and left subclavian central venous catheters. Unchanged right chest tube. Stable cardiomegaly status post CABG. Normal pulmonary vascularity. Unchanged retrocardiac opacity and small left pleural effusion. No pneumothorax. Unchanged 1.4 cm nodule in the peripheral right upper lobe. No acute osseous abnormality. IMPRESSION: 1. Stable left basilar atelectasis and effusion. Electronically Signed   By: Titus Dubin M.D.   On: 09/08/2019 08:05   DG Chest Port 1 View  Result Date: 09/07/2019 CLINICAL DATA:  Tracheostomy tube in place. EXAM: PORTABLE CHEST 1 VIEW COMPARISON:  yesterday FINDINGS: Tracheostomy tube in place. Bilateral central line with tips near the SVC origin. Right chest tube in place. Retrocardiac opacity is unchanged, likely atelectasis and pleural fluid in this setting. Stable postoperative heart size. IMPRESSION: Stable hardware positioning and retrocardiac opacification. Electronically Signed   By: Monte Fantasia M.D.   On: 09/07/2019 08:45   DG Chest Port 1 View  Result Date: 09/06/2019 CLINICAL DATA:  Recent sternotomy revision EXAM: PORTABLE CHEST 1 VIEW COMPARISON:  09/06/2019 FINDINGS: Tracheostomy tube, feeding catheter, right jugular central line left-sided temporary dialysis catheter are noted and stable. New postoperative changes are noted in the midline consistent with the recent sternal revision. Right-sided thoracostomy catheter is noted. No pneumothorax is seen. Mild left basilar atelectasis is noted slightly increased from the prior study. IMPRESSION: Tubes and lines as described above. Recent postsurgical changes. Mild left basilar atelectasis increased from the prior exam. Electronically Signed   By: Inez Catalina M.D.   On: 09/06/2019 19:50       Laurey Morale, MSN, NP-C [facility] [phone]  09/08/2019, 10:45 AM   Attending physician note to follow with Assessment and plan . I have seen the patient reviewed the above note.  He has mild left arm weakness and does have a decreased left nasolabial fold as well.    Assessment: 74 y.o. male  With PMH CAD, HTN, HLD, DM who presented initially on 4/14 for NSTEMI. Neurology consulted on 5/14 for  left sided weakness. CTH; no acute abnormality. Unable to obtain MRI d/t  sternal plate.  I do suspect that he has had a small stroke, though apparently he has had some improvement which is encouraging.  We cannot get a CT angiogram due to renal insufficiency, and he just had carotid Dopplers done prior to the CABG.  I think a repeat CT in the morning could be helpful, though a small subcortical infarct might still be missed.  He is currently not on anticoagulation due to bleeding and anemia.  If needed, for his possible PE, pericardial clot, then from a neurological standpoint I think that it could be reinitiated.  Stroke Risk Factors - diabetes mellitus, hypercoagulable state, hyperlipidemia and hypertension    Recommendations: -- Repeat CT in the morning -- anticoagulation per cardiology -- continue atorvastatin daily -- HgbA1c, fasting lipid panel --Telemetry monitoring --Frequent neuro checks --Stroke swallow screen   Roland Rack, MD [facility] [phone]  If 7pm- 7am, please page neurology on call as listed in [place].

## 2019-09-08 NOTE — Progress Notes (Signed)
Nutrition Follow-up  DOCUMENTATION CODES:   Not applicable  INTERVENTION:   Recommend addition of binder if phosphorus continues to increase.   Continue tube Feeding:  -Vital 1.5 at 50 ml/hr via post-pyloric Cortrak (1200 ml) -Pro-Stat 60 mL TID  Provides 2400 kcals, 171 g of protein and 917 mL of free water Meets 100% estimated needs  Continue B-complex with Vitamin C  NUTRITION DIAGNOSIS:   Increased nutrient needs related to post-op healing as evidenced by estimated needs.  Ongoing  GOAL:   Patient will meet greater than or equal to 90% of their needs  Addressed via TF  MONITOR:   Vent status, Skin, TF tolerance, Weight trends, I & O's, Labs  REASON FOR ASSESSMENT:   Ventilator    ASSESSMENT:   Patient with PMH significant for CAD s/p stenting, HTN, HLD, DM, and BPH. Presents this admission with RLL PE and for CABG.   4/14- admitted  4/20- CABG x 4, chest left open 4/22- chest closure, application of incisional wound vac 4/23- extubated, cardiac arrest, chest re-opened for washout, clot removed, re-intubated 4/26- return to OR for washout, cortrak placed, TF initiated 4/29- chest washout, application of wound VAC, CRRT initiated 5/5- chest washout, tracheostomy 5/10- chest washout, wound VAC 5/12- omental harvest, R pectoral muscle advancement flap, layered closure of sternal wound, wound VAC, stop CRRT 5/13- first HD  Pt discussed during ICU rounds and with RN.   Remains on the vent. Exhibiting left sided weakness- CT head unremarkable. Plan HD again today. Remains anuric. No pressors.Tube feeding restarted yesterday. Tolerating at goal rate.   Patient requiring ventilator support via trach MV: 11.5  L/min Temp (24hrs), Avg:99.3 F (37.4 C), Min:97.7 F (36.5 C), Max:100.6 F (38.1 C)   Admission weight: 97 kg  Current weight: 87.1 kg   I/O: -14,624 ml since 4/30 Stool: 675 ml x 24 hrs Last HD 5/13: 500 ml net UF   Medications: B complex  with C, dulcolax, aranesp, colace, SS novolog, levemir Labs: Phosphorus 5.3 (H) CBG [phone]  Diet Order:   Diet Order    None      EDUCATION NEEDS:   Not appropriate for education at this time  Skin:  Skin Assessment: Skin Integrity Issues: Skin Integrity Issues:: Stage II, Incisions, Wound VAC Stage II: upper lip Wound Vac: chest Incisions: chest, L arm, abdomen Other: n/a  Last BM:  5/14  Height:   Ht Readings from Last 1 Encounters:  08/28/19 5\' 9"  (1.753 m)    Weight:   Wt Readings from Last 1 Encounters:  09/08/19 87.1 kg   BMI:  Body mass index is 28.36 kg/m.  Estimated Nutritional Needs:   Kcal:  2125-2550  Protein:  170-195 grams  Fluid:  >/= 2 L/day   Mariana Single RD, LDN Clinical Nutrition Pager listed in [place]

## 2019-09-08 NOTE — Progress Notes (Addendum)
[facility] DAILY ICU PROGRESS NOTE                   [address]            [place] [address]          [phone]   2 Days Post-Op Procedure(s) (LRB): MEDIASTINAL EXPLORATION (N/A) STERNAL PLATING (N/A) LAPAROSCOPIC OMENTAL HARVEST (N/A) APPLICATION OF ACELL, APPLICATION OF WOUND VAC USING PREVENA INCISIONAL  DRESSING (N/A) Pectoralis ADVANCEMENT Flap  Total Length of Stay:  LOS: 30 days   Subjective:  Patient remains on vent.  Denies pain.  Unable to squeeze left hand this morning.  Objective: Vital signs in last 24 hours: Temp:  [98.3 F (36.8 C)-100.6 F (38.1 C)] 99.2 F (37.3 C) (05/14 1114) Pulse Rate:  [92-107] 99 (05/14 1114) Cardiac Rhythm: Sinus tachycardia (05/14 0800) Resp:  [18-31] 25 (05/14 1114) BP: (76-144)/(43-69) 125/56 (05/14 1100) SpO2:  [98 %-100 %] 100 % (05/14 1114) Arterial Line BP: (96-157)/(41-58) 114/55 (05/14 1114) FiO2 (%):  [30 %] 30 % (05/14 1114) Weight:  [83.8 kg-87.1 kg] 87.1 kg (05/14 0402)  Filed Weights   09/07/19 1200 09/07/19 1515 09/08/19 0402  Weight: 84.2 kg 83.8 kg 87.1 kg    Weight change: 0.2 kg   Intake/Output from previous day: 05/13 0701 - 05/14 0700 In: 1050.1 [NG/GT:600; IV Piggyback:450.1] Out: 1315 [Drains:140; Stool:675]  Intake/Output this shift: Total I/O In: 792 [I.V.:60; Blood:402; NG/GT:330] Out: -   Current Meds: Scheduled Meds: . sodium chloride   Intravenous Once  . sodium chloride   Intravenous Once  . amiodarone  200 mg Per Tube BID  . aspirin  81 mg Per Tube Daily  . atorvastatin  40 mg Per Tube Daily  . B-complex with vitamin C  1 tablet Per Tube Daily  . bisacodyl  10 mg Oral Daily   Or  . bisacodyl  10 mg Rectal Daily  . chlorhexidine gluconate (MEDLINE KIT)  15 mL Mouth Rinse BID  . Chlorhexidine Gluconate Cloth  6 each Topical Daily  . Chlorhexidine Gluconate Cloth  6 each Topical Q0600  . clopidogrel  75 mg Per Tube Daily  . darbepoetin (ARANESP) injection -  NON-DIALYSIS  150 mcg Subcutaneous Q Mon-1800  . docusate  100 mg Per Tube BID  . feeding supplement (PRO-STAT SUGAR FREE 64)  60 mL Per Tube TID  . insulin aspart  0-24 Units Subcutaneous Q4H  . insulin detemir  10 Units Subcutaneous BID  . mouth rinse  15 mL Mouth Rinse 10 times per day  . midodrine  10 mg Oral TID WC  . pantoprazole sodium  40 mg Per Tube Daily  . sertraline  25 mg Oral Daily  . sodium chloride flush  10-40 mL Intracatheter Q12H  . sodium chloride flush  10-40 mL Intracatheter Q12H  . trospium  20 mg Per Tube BID  . vancomycin variable dose per unstable renal function (pharmacist dosing)   Does not apply See admin instructions   Continuous Infusions: . sodium chloride 250 mL (09/07/19 2033)  . feeding supplement (VITAL 1.5 CAL) Stopped (09/06/19 1900)  . fluconazole (DIFLUCAN) IV Stopped (09/07/19 2237)  . lactated ringers 20 mL/hr at 08/20/19 1200  . meropenem (MERREM) IV Stopped (09/07/19 2144)   PRN Meds:.acetaminophen, ALPRAZolam, cromolyn, dextrose, heparin, heparin, hydrALAZINE, HYDROmorphone (DILAUDID) injection, ondansetron (ZOFRAN) IV, oxyCODONE, sodium chloride flush, sodium chloride flush  General appearance: on vent, responsive Heart: regular rate and rhythm Lungs: clear to auscultation bilaterally  Abdomen: + distention, hypoactive BS Extremities: extremities normal, atraumatic, no cyanosis or edema Wound: pravena wound vac in place on sternum  Lab Results: CBC: Recent Labs    09/07/19 0223 09/08/19 0314  WBC 14.3* 9.2  HGB 8.2* 6.5*  HCT 26.6* 21.0*  PLT 203 186   BMET:  Recent Labs    09/07/19 0707 09/08/19 0314  NA 137 137  K 5.8* 4.0  CL 104 101  CO2 21* 25  GLUCOSE 112* 175*  BUN 52* 50*  CREATININE 4.32* 3.62*  CALCIUM 8.8* 8.1*    CMET: Lab Results  Component Value Date   WBC 9.2 09/08/2019   HGB 6.5 (LL) 09/08/2019   HCT 21.0 (L) 09/08/2019   PLT 186 09/08/2019   GLUCOSE 175 (H) 09/08/2019   CHOL 107 08/11/2019    TRIG 71 08/27/2019   HDL 42 08/11/2019   LDLDIRECT 40.0 11/23/2018   LDLCALC 40 08/11/2019   ALT 127 (H) 09/07/2019   AST 72 (H) 09/07/2019   NA 137 09/08/2019   K 4.0 09/08/2019   CL 101 09/08/2019   CREATININE 3.62 (H) 09/08/2019   BUN 50 (H) 09/08/2019   CO2 25 09/08/2019   TSH 2.590 04/19/2019   INR 1.3 (H) 08/19/2019   HGBA1C 7.7 (H) 08/14/2019      PT/INR: No results for input(s): LABPROT, INR in the last 72 hours. Radiology: [facility] Chest Port 1 View  Result Date: 09/08/2019 CLINICAL DATA:  Status post CABG and mediastinal washout. EXAM: PORTABLE CHEST 1 VIEW COMPARISON:  Chest x-ray from yesterday. FINDINGS: Unchanged tracheostomy and feeding tubes. Unchanged right internal jugular and left subclavian central venous catheters. Unchanged right chest tube. Stable cardiomegaly status post CABG. Normal pulmonary vascularity. Unchanged retrocardiac opacity and small left pleural effusion. No pneumothorax. Unchanged 1.4 cm nodule in the peripheral right upper lobe. No acute osseous abnormality. IMPRESSION: 1. Stable left basilar atelectasis and effusion. Electronically Signed   By: Titus Dubin M.D.   On: 09/08/2019 08:05     Assessment/Plan: S/P Procedure(s) (LRB): MEDIASTINAL EXPLORATION (N/A) STERNAL PLATING (N/A) LAPAROSCOPIC OMENTAL HARVEST (N/A) APPLICATION OF ACELL, APPLICATION OF WOUND VAC USING PREVENA INCISIONAL  DRESSING (N/A) Pectoralis ADVANCEMENT Flap  1. CV- NSR, on Amiodarone, Midodrine 2. Pulm- remains on vent, trach in place, weaning per CCM as able 3. Neuro- not moving left side much, neurology consult obtained, CT of head showed no abnormalities.Marland Kitchen MRI not possible due to sternal plating 4. Renal- AKI, creatinine at 3.62, HD per Neprhology 5. Anemia- Hgb down to 6.5 this morning, he is receiving transfusion currently 6. GI- Ileus- remains distended,  7. Dispo- continue current ca  Christian Handler, PA-C  09/08/2019 11:25 AM     Discussed with Dr Aundra Dubin-  neurology has seen today Patient awake , intermittent follows commands , left arm weaker then right but improved from yesterday will lift against gravity  Holding sinus  Wound dressing intact Tube feeding resumed yesterday I have seen and examined Christian [name] and agree with the above assessment  and plan.  Grace Adaiah MD [facility] [phone] Office [phone] 09/08/2019 1:24 PM

## 2019-09-08 NOTE — Progress Notes (Signed)
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 4/23 cardiac arrest, 4/ 29 initiated CRRT, vent s/p trach. 5/10 additional sternal washout unable to close.  5/12 sternal plating with pectoralis flap and omental harvest to mediastinum off CRRT with iHD started. PMHx: BPH, CAD, DM, HLD, HTN    PT Comments    Pt awake and alert with decreased cognition, command following and strength noted this session. Pt aware he is in the hospital and the year but could not correctly answer city or situation. Pt with decreased movement of bil UE and LE today with significant delay in activation of extremities. Pt with grossly 2-/ 5 bil quad strength noted by end of session and 2/5 dorsiflexion without further activation noted. Pt with maintained left lean and unaware.Pt continues to tolerate full chair sitting position well on vent but limited by balance, weakness and cognition.   CPAP/PS on vent HR 89    Follow Up Recommendations  LTACH;Supervision/Assistance - 24 hour     Equipment Recommendations  Other (comment)(TBD)    Recommendations for Other Services       Precautions / Restrictions Precautions Precautions: Fall;Sternal Precaution Comments: trach, vent, sternal VAC, jp drain    Mobility  Bed Mobility Overal bed mobility: Needs Assistance Bed Mobility: Supine to Sit;Sit to Supine           General bed mobility comments: Pt egressed bed to chair position with feet on floor x18 mins; BUE and LE exercises and sitting balance. Total +2 to slide toward HOB in supine  Transfers                 General transfer comment: pt not yet appropriate to attempt  Ambulation/Gait                 Stairs             Wheelchair Mobility    Modified Rankin (Stroke  Patients Only)       Balance Overall balance assessment: Needs assistance   Sitting balance-Leahy Scale: Zero Sitting balance - Comments: foot egress with maintained left lean. facilitation for midline posture and neck extension Postural control: Posterior lean;Left lateral lean                                  Cognition Arousal/Alertness: Awake/alert Behavior During Therapy: WFL for tasks assessed/performed Overall Cognitive Status: Impaired/Different from baseline Area of Impairment: Orientation;Problem solving;Following commands                 Orientation Level: Disoriented to;Time;Situation;Place     Following Commands: Follows one step commands inconsistently       General Comments: pt following single step commands with extensive delay. Pt did not move bil LE initially and required PROM but then later in session demonstrated 2/5 quad activation. pt mouthing words and nodding. Pt asking for water unaware of vent      Exercises General Exercises - Lower Extremity Long Arc Quad: Seated;10 reps;Both;PROM Hip Flexion/Marching: Both;Seated;10 reps;PROM    General Comments        Pertinent Vitals/Pain Pain Assessment: No/denies pain    Home Living  Prior Function            PT Goals (current goals can now be found in the care plan section) Progress towards PT goals: Not progressing toward goals - comment    Frequency    Min 3X/week      PT Plan Current plan remains appropriate    Co-evaluation PT/OT/SLP Co-Evaluation/Treatment: Yes Reason for Co-Treatment: Complexity of the patient's impairments (multi-system involvement);For patient/therapist safety PT goals addressed during session: Mobility/safety with mobility;Strengthening/ROM        AM-PAC PT "6 Clicks" Mobility   Outcome Measure  Help needed turning from your back to your side while in a flat bed without using bedrails?: Total Help needed  moving from lying on your back to sitting on the side of a flat bed without using bedrails?: Total Help needed moving to and from a bed to a chair (including a wheelchair)?: Total Help needed standing up from a chair using your arms (e.g., wheelchair or bedside chair)?: Total Help needed to walk in hospital room?: Total Help needed climbing 3-5 steps with a railing? : Total 6 Click Score: 6    End of Session   Activity Tolerance: Patient tolerated treatment well Patient left: in bed;with call bell/phone within reach;with nursing/sitter in room Nurse Communication: Mobility status;Need for lift equipment;Precautions PT Visit Diagnosis: Other abnormalities of gait and mobility (R26.89);Muscle weakness (generalized) (M62.81);Other symptoms and signs involving the nervous system (R29.898)     Time: 1761-6073 PT Time Calculation (min) (ACUTE ONLY): 31 min  Charges:  $Therapeutic Activity: 8-22 mins                      P, PT Acute Rehabilitation Services Pager: [phone] Office: [place] 09/08/2019, 1:30 PM

## 2019-09-08 NOTE — Progress Notes (Signed)
Occupational Therapy Treatment Patient Details Name: Christian [name] MRN: [id number] DOB: Jan 15, 1946 Today's Date: 09/08/2019    History of present illness 74 yo admitted with NSTEMI 4/15, RLL PE, 4/20 CABG x 4 remained open with wound VAC with repeated washouts. 4/23 cardiac arrest, 4/ 29 initiated CRRT, vent s/p trach. 5/10 additional sternal washout unable to close.  5/12 sternal plating with pectoralis flap and omental harvest to mediastinum off CRRT with iHD started. PMHx: BPH, CAD, DM, HLD, HTN   OT comments  Pt seen for OT follow up, focus on BADL mobility progression. Pt placed in chair position for ~18 minutes with focus on bil UE/LE movement and truncal exercise. Pt presents with heavy L lateral lean and little awareness to this. Positioned pt LUE on bedside table within sternal precautions to facilitate input to LUE and promote midline posture. Pt is able to move hand/wrist at 2/5 with trace movement noted in bicep. RUE weakness noted as well with delayed processing time. Pt not able to attain functional hand to mouth pattern without mod A. Pt was not oriented this date and required extra time and increased cueing to perform simple one step tasks. Updated recs to LTACH due to current status and progress. Will continue to follow.   Follow Up Recommendations  LTACH;Supervision/Assistance - 24 hour    Equipment Recommendations  None recommended by OT    Recommendations for Other Services      Precautions / Restrictions Precautions Precautions: Fall;Sternal Precaution Comments: trach, vent, sternal flap with VAC, jp drain       Mobility Bed Mobility Overal bed mobility: Needs Assistance Bed Mobility: Supine to Sit;Sit to Supine           General bed mobility comments: Pt egressed bed to chair position with feet on floor x18 mins; BUE and LE exercises and sitting balance. Total +2 to slide toward HOB in supine  Transfers                 General transfer comment:  pt not yet appropriate to attempt    Balance Overall balance assessment: Needs assistance   Sitting balance-Leahy Scale: Zero Sitting balance - Comments: foot egress with maintained left lean. facilitation for midline posture and neck extension Postural control: Posterior lean;Left lateral lean                                 ADL either performed or assessed with clinical judgement   ADL Overall ADL's : Needs assistance/impaired Eating/Feeding: NPO Eating/Feeding Details (indicate cue type and reason): cortrak Grooming: Moderate assistance;Sitting                                 General ADL Comments: session focued on placing bed in chair position and engaging in UE/LE exercise. Pt continues to present with L>R sided weakness.     Vision Baseline Vision/History: Wears glasses Vision Assessment?: Vision impaired- to be further tested in functional context Additional Comments: pt with unoragnized tracking in session, needing cues to turn head to the left and track therapist hand with eyes. Tracking appears delayed.  Will need to continue to assess   Perception     Praxis      Cognition Arousal/Alertness: Awake/alert Behavior During Therapy: WFL for tasks assessed/performed Overall Cognitive Status: Impaired/Different from baseline Area of Impairment: Orientation;Problem solving;Following commands  Orientation Level: Disoriented to;Time;Situation;Place(Pt given choices for orientation questions. he was able to nod "yes to being in hospital" but not for time or situation.)     Following Commands: Follows one step commands inconsistently;Follows one step commands with increased time     Problem Solving: Slow processing;Decreased initiation;Requires verbal cues;Requires tactile cues General Comments: Pt having delay in processing time with simple commands        Exercises General Exercises - Lower Extremity Long Arc Quad:  Seated;10 reps;Both;PROM Hip Flexion/Marching: Both;Seated;10 reps;PROM Other Exercises Other Exercises: positioned LUE on bedside table within sternal precautions to engage in finger taps and purposrful UE movement   Shoulder Instructions       General Comments      Pertinent Vitals/ Pain       Pain Assessment: Faces Faces Pain Scale: Hurts a little bit Pain Location: coretrak sensitive to touch Pain Descriptors / Indicators: Discomfort;Grimacing Pain Intervention(s): Monitored during session;Repositioned  Home Living                                          Prior Functioning/Environment              Frequency  Min 2X/week        Progress Toward Goals  OT Goals(current goals can now be found in the care plan section)  Progress towards OT goals: Progressing toward goals  Acute Rehab OT Goals Patient Stated Goal: return to independence OT Goal Formulation: With patient Time For Goal Achievement: 09/16/19 Potential to Achieve Goals: [place] Discharge plan needs to be updated    Co-evaluation    PT/OT/SLP Co-Evaluation/Treatment: Yes Reason for Co-Treatment: Complexity of the patient's impairments (multi-system involvement) PT goals addressed during session: Mobility/safety with mobility;Strengthening/ROM OT goals addressed during session: Strengthening/ROM;ADL's and self-care      AM-PAC OT "6 Clicks" Daily Activity     Outcome Measure   Help from another person eating meals?: Total Help from another person taking care of personal grooming?: A Lot Help from another person toileting, which includes using toliet, bedpan, or urinal?: Total Help from another person bathing (including washing, rinsing, drying)?: Total Help from another person to put on and taking off regular upper body clothing?: Total Help from another person to put on and taking off regular lower body clothing?: Total 6 Click Score: 7    End of Session Equipment  Utilized During Treatment: Oxygen  OT Visit Diagnosis: Unsteadiness on feet (R26.81);Muscle weakness (generalized) (M62.81);Pain Pain - part of body: (coretrak tube)   Activity Tolerance Patient tolerated treatment well   Patient Left in bed;with call bell/phone within reach;with bed alarm set   Nurse Communication Mobility status        Time: 0160-1093 OT Time Calculation (min): 27 min  Charges: OT General Charges $OT Visit: 1 Visit OT Treatments $Self Care/Home Management : 8-22 mins  Zenovia Jarred, MSOT, OTR/L [facility] Office Number: [phone] Pager: [place] 09/08/2019, 3:59 PM

## 2019-09-08 NOTE — Progress Notes (Addendum)
Patient ID: Christian [name], male   DOB: January 24, 1946, 74 y.o.   MRN: [id number]   S:  Last CV on 5/12. Tolerated dialysis yesterday w/ 1200 UF (required small bolus during dialysis due to hypotension). Expresses no complaints today   O:BP (!) 133/59   Pulse 96   Temp 98.9 F (37.2 C) (Oral)   Resp (!) 21   Ht 5' 9"  (1.753 m)   Wt 87.1 kg   SpO2 100%   BMI 28.36 kg/m   Intake/Output Summary (Last 24 hours) at 09/08/2019 1023 Last data filed at 09/08/2019 0945 Gross per 24 hour  Intake 1462.13 ml  Output 1315 ml  Net 147.13 ml   Intake/Output: I/O last 3 completed shifts: In: 1723.8 [I.V.:120; NG/GT:760; IV Piggyback:843.8] Out: 1445 [Drains:270; Other:500; Stool:675]  Intake/Output this shift:  Total I/O In: 462 [I.V.:60; Blood:402] Out: -  Weight change: 0.2 kg Gen:  Alert on vent via trach left subclavian HD cath placed 5/7 CVS: RRR, no rub, open chest wound with wound vac in place Resp: cta, bilateral chest rise Abd: distended, +BS, soft, NT  Ext: 1+ edema  Recent Labs  Lab 09/02/19 0311 09/02/19 1530 09/05/19 0434 09/05/19 0434 09/05/19 1630 09/06/19 0404 09/06/19 1648 09/07/19 0029 09/07/19 0223 09/07/19 0707 09/08/19 0314  NA 137   < > 139   < > 139 138 138 136 136 137 137  K 3.9   < > 4.8   < > 4.3 4.5 5.1 5.6* 6.2* 5.8* 4.0  CL 104   < > 105  --  102 102 105  --  103 104 101  CO2 20*   < > 25  --  26 26 24   --  22 21* 25  GLUCOSE 238*   < > 172*  --  164* 155* 127*  --  116* 112* 175*  BUN 87*   < > 34*  --  30* 28* 32*  --  47* 52* 50*  CREATININE 4.33*   < > 2.12*  --  1.94* 1.92* 2.60*  --  3.68* 4.32* 3.62*  ALBUMIN 2.1*   < > 2.2*  --  2.4* 2.4* 2.3*  --  2.2* 2.2* 2.2*  CALCIUM 8.2*   < > 7.9*  --  8.4* 8.5* 8.6*  --  8.5* 8.8* 8.1*  PHOS  --    < > 2.3*  --  3.5 2.7 4.5  --  5.3* 5.9* 5.3*  AST 38  --   --   --   --   --   --   --  72*  --   --   ALT 49*  --   --   --   --   --   --   --  127*  --   --    < > = values in this interval not  displayed.   Liver Function Tests: Recent Labs  Lab 09/02/19 0311 09/02/19 1530 09/07/19 0223 09/07/19 0707 09/08/19 0314  AST 38  --  72*  --   --   ALT 49*  --  127*  --   --   ALKPHOS 104  --  193*  --   --   BILITOT 1.0  --  1.1  --   --   PROT 5.1*  --  5.5*  --   --   ALBUMIN 2.1*   < > 2.2* 2.2* 2.2*   < > = values in this interval not displayed.  No results for input(s): LIPASE, AMYLASE in the last 168 hours. No results for input(s): AMMONIA in the last 168 hours. CBC: Recent Labs  Lab 09/02/19 0311 09/02/19 0945 09/05/19 2841 09/05/19 3244 09/06/19 0404 09/06/19 0404 09/06/19 1648 09/06/19 1648 09/07/19 0029 09/07/19 0223 09/08/19 0314  WBC 21.6*   < > 12.1*   < > 12.4*   < > 14.2*  --   --  14.3* 9.2  NEUTROABS 16.8*  --   --   --   --   --   --   --   --   --   --   HGB 6.4*   < > 7.4*   < > 8.7*   < > 8.6*   < > 7.8* 8.2* 6.5*  HCT 20.0*   < > 24.1*   < > 28.0*   < > 27.7*   < > 23.0* 26.6* 21.0*  MCV 96.2   < > 97.6  --  98.2  --  98.6  --   --  100.4* 100.5*  PLT 184   < > 175   < > 200   < > 207  --   --  203 186   < > = values in this interval not displayed.   Cardiac Enzymes: No results for input(s): CKTOTAL, CKMB, CKMBINDEX, TROPONINI in the last 168 hours. CBG: Recent Labs  Lab 09/07/19 1152 09/07/19 1540 09/07/19 1937 09/07/19 2346 09/08/19 0319  GLUCAP 143* 89 175* 143* 170*    Iron Studies: No results for input(s): IRON, TIBC, TRANSFERRIN, FERRITIN in the last 72 hours. Studies/Results: CT HEAD WO CONTRAST  Result Date: 09/07/2019 CLINICAL DATA:  Stroke follow-up.  New left-sided paralysis. EXAM: CT HEAD WITHOUT CONTRAST TECHNIQUE: Contiguous axial images were obtained from the base of the skull through the vertex without intravenous contrast. COMPARISON:  08/26/2019 FINDINGS: Brain: No evidence of acute infarction, hemorrhage, obstructive hydrocephalus, extra-axial collection or mass lesion/mass effect. Generalized atrophy and  ventriculomegaly. Vascular: No hyperdense vessel or unexpected calcification. Skull: Normal. Negative for fracture or focal lesion. Sinuses/Orbits: Negative IMPRESSION: 1. No acute finding.  No visible infarct. 2. Atrophy and ventriculomegaly. Electronically Signed   By: Monte Fantasia M.D.   On: 09/07/2019 09:22   DG Chest Port 1 View  Result Date: 09/08/2019 CLINICAL DATA:  Status post CABG and mediastinal washout. EXAM: PORTABLE CHEST 1 VIEW COMPARISON:  Chest x-ray from yesterday. FINDINGS: Unchanged tracheostomy and feeding tubes. Unchanged right internal jugular and left subclavian central venous catheters. Unchanged right chest tube. Stable cardiomegaly status post CABG. Normal pulmonary vascularity. Unchanged retrocardiac opacity and small left pleural effusion. No pneumothorax. Unchanged 1.4 cm nodule in the peripheral right upper lobe. No acute osseous abnormality. IMPRESSION: 1. Stable left basilar atelectasis and effusion. Electronically Signed   By: Titus Dubin M.D.   On: 09/08/2019 08:05   DG Chest Port 1 View  Result Date: 09/07/2019 CLINICAL DATA:  Tracheostomy tube in place. EXAM: PORTABLE CHEST 1 VIEW COMPARISON:  yesterday FINDINGS: Tracheostomy tube in place. Bilateral central line with tips near the SVC origin. Right chest tube in place. Retrocardiac opacity is unchanged, likely atelectasis and pleural fluid in this setting. Stable postoperative heart size. IMPRESSION: Stable hardware positioning and retrocardiac opacification. Electronically Signed   By: Monte Fantasia M.D.   On: 09/07/2019 08:45   DG Chest Port 1 View  Result Date: 09/06/2019 CLINICAL DATA:  Recent sternotomy revision EXAM: PORTABLE CHEST 1 VIEW COMPARISON:  09/06/2019 FINDINGS: Tracheostomy tube, feeding  catheter, right jugular central line left-sided temporary dialysis catheter are noted and stable. New postoperative changes are noted in the midline consistent with the recent sternal revision. Right-sided  thoracostomy catheter is noted. No pneumothorax is seen. Mild left basilar atelectasis is noted slightly increased from the prior study. IMPRESSION: Tubes and lines as described above. Recent postsurgical changes. Mild left basilar atelectasis increased from the prior exam. Electronically Signed   By: Inez Catalina M.D.   On: 09/06/2019 19:50   . sodium chloride   Intravenous Once  . sodium chloride   Intravenous Once  . amiodarone  200 mg Per Tube BID  . aspirin  81 mg Per Tube Daily  . atorvastatin  40 mg Per Tube Daily  . B-complex with vitamin C  1 tablet Per Tube Daily  . bisacodyl  10 mg Oral Daily   Or  . bisacodyl  10 mg Rectal Daily  . chlorhexidine gluconate (MEDLINE KIT)  15 mL Mouth Rinse BID  . Chlorhexidine Gluconate Cloth  6 each Topical Daily  . Chlorhexidine Gluconate Cloth  6 each Topical Q0600  . clopidogrel  75 mg Per Tube Daily  . darbepoetin (ARANESP) injection - NON-DIALYSIS  150 mcg Subcutaneous Q Mon-1800  . docusate  100 mg Per Tube BID  . feeding supplement (PRO-STAT SUGAR FREE 64)  60 mL Per Tube TID  . insulin aspart  0-24 Units Subcutaneous Q4H  . insulin detemir  10 Units Subcutaneous BID  . mouth rinse  15 mL Mouth Rinse 10 times per day  . midodrine  10 mg Oral TID WC  . pantoprazole sodium  40 mg Per Tube Daily  . sertraline  25 mg Oral Daily  . sodium chloride flush  10-40 mL Intracatheter Q12H  . sodium chloride flush  10-40 mL Intracatheter Q12H  . trospium  20 mg Per Tube BID  . vancomycin variable dose per unstable renal function (pharmacist dosing)   Does not apply See admin instructions    BMET    Component Value Date/Time   NA 137 09/08/2019 0314   NA 141 06/19/2019 1130   K 4.0 09/08/2019 0314   CL 101 09/08/2019 0314   CO2 25 09/08/2019 0314   GLUCOSE 175 (H) 09/08/2019 0314   BUN 50 (H) 09/08/2019 0314   BUN 21 06/19/2019 1130   CREATININE 3.62 (H) 09/08/2019 0314   CALCIUM 8.1 (L) 09/08/2019 0314   GFRNONAA 16 (L) 09/08/2019 0314    GFRAA 18 (L) 09/08/2019 0314   CBC    Component Value Date/Time   WBC 9.2 09/08/2019 0314   RBC 2.09 (L) 09/08/2019 0314   HGB 6.5 (LL) 09/08/2019 0314   HCT 21.0 (L) 09/08/2019 0314   PLT 186 09/08/2019 0314   MCV 100.5 (H) 09/08/2019 0314   MCH 31.1 09/08/2019 0314   MCHC 31.0 09/08/2019 0314   RDW 22.2 (H) 09/08/2019 0314   LYMPHSABS 1.1 09/02/2019 0311   MONOABS 1.5 (H) 09/02/2019 0311   EOSABS 0.3 09/02/2019 0311   BASOSABS 0.1 09/02/2019 0311     Assessment/Plan:  1. Shock- improved now on meripenem, vanc, and fluconazole.  No pressors 2. AKI- (crt 1.22 on 07/24/19) following cardiac cath, cabg, cardiogenic shock, cardiac arrest due to tamponade, and acute on chronic CHF. Started CRRT on 08/24/19-5/12 due to extensive volume overload presenting chest closure. Now s/p HD x1 on 5/13 and toelrated well.  1. HD first session 5/13 and tolerated well 2. Plan for HD again tomorrow and UF  1.5L as tolerated 2. Left subclavian trialysis catheter placed by PCCM on 5/7 3. No UOP-  Unclear if recovery is possible given major insult 3. 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 Acute systolic heart failure due to ischemic cardiomyopathy- volume markedly improved with CRRT. Will cont UF w/ HD since tolerating and no pressors 5. Postoperative acute hypoxic respiratory failure- reintubated 08/18/19 and likely aspiration. Now trach 6. Anemia- Hgb worsened today. transfuse prn.added ESA- darbe 150 7. Ileus-  Remains distended. Per primary. 8. E[place]tes-  Phos is OK at 5.3. Hyperkalemia improved. No action needed   [facility] [phone]

## 2019-09-08 NOTE — Progress Notes (Addendum)
Patient ID: Christian [name], male   DOB: 06-07-1945, 74 y.o.   MRN: [id number]     Advanced Heart Failure Rounding Note  PCP-Cardiologist: No primary care provider on file.   Subjective:    08/09/19 Admitted with NSTEM and ? Small RLL PE. Weight 213 pounds.  08/15/19 CABG x4 on 08/15/19 08/18/19 Cardiac arrest due to tamponade. Chest opened for washout 08/20/19 Echo post-pericardial evacuation: EF 40-45% with some residual pericardial clot. 4/26 Chest washout in OR 4/29 CVVH started 5/5 OR for chest washout, tracheostomy 5/7 HD catheter replaced 5/10 OR for washout/dressing change.  5/12 To OR for chest closure with muscle flap  Continues w/ left sided weakness.  Head CT 5/13 unremarkable. Unable to get brain MRI due to sternal wires.    Hgb down to 6.5. Getting a unit of blood   Continues on iHD w/ limited volume removal yesterday due to hypotension. MAPs upper 70s-low 80s currently.   Remains on vent through trach. FiO2 30%. PEEP 5.  Trach aspirate + rare pseudomonas  WBC 21k -> 16->14.4-->9.2. On abx. mTemp past 24 hrs 100.4  Objective:   Weight Range: 87.1 kg Body mass index is 28.36 kg/m.   Vital Signs:   Temp:  [98.3 F (36.8 C)-100.4 F (38 C)] 100 F (37.8 C) (05/14 0639) Pulse Rate:  [92-107] 100 (05/14 0700) Resp:  [18-24] 18 (05/14 0700) BP: (76-136)/(43-79) 122/61 (05/14 0700) SpO2:  [100 %] 100 % (05/14 0700) Arterial Line BP: (96-157)/(41-57) 128/53 (05/14 0700) FiO2 (%):  [30 %] 30 % (05/14 0414) Weight:  [83.8 kg-87.1 kg] 87.1 kg (05/14 0402) Last BM Date: 09/08/19  Weight change: Filed Weights   09/07/19 1200 09/07/19 1515 09/08/19 0402  Weight: 84.2 kg 83.8 kg 87.1 kg    Intake/Output:   Intake/Output Summary (Last 24 hours) at 09/08/2019 0759 Last data filed at 09/08/2019 0600 Gross per 24 hour  Intake 1000.13 ml  Output 1315 ml  Net -314.87 ml      Physical Exam   General:  Elderly AAM, alert, remains on vent through trach HEENT:  normal, + NTG Neck: supple. no JVD. Carotids 2+ bilat; no bruits. No lymphadenopathy or thryomegaly appreciated. + Rt IJ HD cath + TC Cor: PMI nondisplaced. Regular rate & rhythm. No rubs, gallops or murmurs. + Sternal VAC w/ good seal  Lungs: clear, no wheezing  Abdomen: soft, nontender, nondistended. No hepatosplenomegaly. No bruits or masses. Good bowel sounds. Extremities: no cyanosis, clubbing, rash, edema. Neuro: alert. Follows commands. Not moving LUE or L foot.   Telemetry   Sinus tach low 100s   Labs    CBC Recent Labs    09/07/19 0223 09/08/19 0314  WBC 14.3* 9.2  HGB 8.2* 6.5*  HCT 26.6* 21.0*  MCV 100.4* 100.5*  PLT 203 712   Basic Metabolic Panel Recent Labs    09/07/19 0223 09/07/19 0223 09/07/19 0707 09/08/19 0314  NA 136   < > 137 137  K 6.2*   < > 5.8* 4.0  CL 103   < > 104 101  CO2 22   < > 21* 25  GLUCOSE 116*   < > 112* 175*  BUN 47*   < > 52* 50*  CREATININE 3.68*   < > 4.32* 3.62*  CALCIUM 8.5*   < > 8.8* 8.1*  MG 2.5*  --   --  2.1  PHOS 5.3*   < > 5.9* 5.3*   < > = values in this interval not displayed.  Liver Function Tests Recent Labs    09/07/19 0223 09/07/19 0223 09/07/19 0707 09/08/19 0314  AST 72*  --   --   --   ALT 127*  --   --   --   ALKPHOS 193*  --   --   --   BILITOT 1.1  --   --   --   PROT 5.5*  --   --   --   ALBUMIN 2.2*   < > 2.2* 2.2*   < > = values in this interval not displayed.   No results for input(s): LIPASE, AMYLASE in the last 72 hours. Cardiac Enzymes No results for input(s): CKTOTAL, CKMB, CKMBINDEX, TROPONINI in the last 72 hours.  BNP: BNP (last 3 results) Recent Labs    08/09/19 1240 09/02/19 0311  BNP 89.1 761.0*    ProBNP (last 3 results) Recent Labs    11/23/18 1120 08/09/19 0922  PROBNP 18.0 78.0     D-Dimer No results for input(s): DDIMER in the last 72 hours. Hemoglobin A1C No results for input(s): HGBA1C in the last 72 hours. Fasting Lipid Panel No results for  input(s): CHOL, HDL, LDLCALC, TRIG, CHOLHDL, LDLDIRECT in the last 72 hours. Thyroid Function Tests No results for input(s): TSH, T4TOTAL, T3FREE, THYROIDAB in the last 72 hours.  Invalid input(s): FREET3  Other results:   Imaging    CT HEAD WO CONTRAST  Result Date: 09/07/2019 CLINICAL DATA:  Stroke follow-up.  New left-sided paralysis. EXAM: CT HEAD WITHOUT CONTRAST TECHNIQUE: Contiguous axial images were obtained from the base of the skull through the vertex without intravenous contrast. COMPARISON:  08/26/2019 FINDINGS: Brain: No evidence of acute infarction, hemorrhage, obstructive hydrocephalus, extra-axial collection or mass lesion/mass effect. Generalized atrophy and ventriculomegaly. Vascular: No hyperdense vessel or unexpected calcification. Skull: Normal. Negative for fracture or focal lesion. Sinuses/Orbits: Negative IMPRESSION: 1. No acute finding.  No visible infarct. 2. Atrophy and ventriculomegaly. Electronically Signed   By: Monte Fantasia M.D.   On: 09/07/2019 09:22     Medications:     Scheduled Medications: . sodium chloride   Intravenous Once  . sodium chloride   Intravenous Once  . amiodarone  200 mg Per Tube BID  . aspirin  81 mg Per Tube Daily  . atorvastatin  40 mg Per Tube Daily  . B-complex with vitamin C  1 tablet Per Tube Daily  . bisacodyl  10 mg Oral Daily   Or  . bisacodyl  10 mg Rectal Daily  . chlorhexidine gluconate (MEDLINE KIT)  15 mL Mouth Rinse BID  . Chlorhexidine Gluconate Cloth  6 each Topical Daily  . Chlorhexidine Gluconate Cloth  6 each Topical Q0600  . clopidogrel  75 mg Per Tube Daily  . darbepoetin (ARANESP) injection - NON-DIALYSIS  150 mcg Subcutaneous Q Mon-1800  . docusate  100 mg Per Tube BID  . feeding supplement (PRO-STAT SUGAR FREE 64)  60 mL Per Tube TID  . insulin aspart  0-24 Units Subcutaneous Q4H  . insulin detemir  10 Units Subcutaneous BID  . mouth rinse  15 mL Mouth Rinse 10 times per day  . midodrine  10 mg  Oral TID WC  . pantoprazole sodium  40 mg Per Tube Daily  . sertraline  25 mg Oral Daily  . sodium chloride flush  10-40 mL Intracatheter Q12H  . sodium chloride flush  10-40 mL Intracatheter Q12H  . trospium  20 mg Per Tube BID  . vancomycin variable dose per  unstable renal function (pharmacist dosing)   Does not apply See admin instructions    Infusions: . sodium chloride 250 mL (09/07/19 2033)  . feeding supplement (VITAL 1.5 CAL) Stopped (09/06/19 1900)  . fluconazole (DIFLUCAN) IV Stopped (09/07/19 2237)  . lactated ringers 20 mL/hr at 08/20/19 1200  . meropenem [facility]) IV Stopped (09/07/19 2144)    PRN Medications: acetaminophen, ALPRAZolam, cromolyn, dextrose, heparin, heparin, hydrALAZINE, HYDROmorphone (DILAUDID) injection, ondansetron (ZOFRAN) IV, oxyCODONE, sodium chloride flush, sodium chloride flush     Assessment/Plan   1. CAD: s/p PDA angioplasty + stent in 02/2014.  Admitted for NSTEMI 4/14. Hs Trop peaked at 3,309.  LHC w/ severe 3V CAD. Echo w/ reduced EF 30-35%. RV ok.  S/p CABG x 4 on 4/20 (LIMA-LAD, RIMA to PDA, Lt radial artery graft to 1st OM and distal OM branches of LCx).  Chest reopened urgently at the bedside for cardiac tamponade and removal of clot on 4/23  - Has been on ASA. Continue plavix as he will need anticoagulation long-term.  - Atorvastatin 40 daily.   - S/P muscle flap by plastic surgery  2. Acute Systolic Heart Failure: Ischemic Cardiomyopathy -> cardiogenic shock.  LVEF severely reduced at 30-35%, in the setting of NSTEM/ Multivessel CAD, RV ok initially.  Underwent urgent sternotomy at the bedside for cardiac tamponade and removal of clot on 4/23. Chest remains open. Luiz Blare numbers have been concerning for RV failure (swan now out).  Echo 4/24 EF 40 to 45% with inotropic support, small amount of residual pericardial clot, RV moderately hypokinetic.  He has been getting CVVH, looks euvolemic.   - Off all pressors. MAPs stable.  - Now on iHD for  volume control 3.  Postoperative acute hypoxic respiratory failure: Reintubated on 4/23 due to cardiogenic shock and return to OR. Now with tracheostomy, FiO2 30% .  -  Per CCM.  4. AKI: Baseline SCr 0.9. Creatinine peaked to 5.3, suspect ATN in setting of cardiogenic shock/tamponade.  - on iHD. Management per nephrology - K ok today at 4.0. Can use PRN lokelma or veltassa on non HD days if needed 5. Shock liver: Due to cardiac arrest on 4/23.  LFTs trending down.  6. 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 Maintaining NSR with PACs.   - Continue amiodarone per tube. - Off heparin due to bleeding. Hold transition to [place] given anemia requiring transfusion  7.  Possible small right lower lobe PE: Chest CT 4/14 w/ small RLL PE.  Bilateral Venous Dopplers negative for DVT.  - Off heparin due to bleeding. Hold transition to [place] given anemia requiring transfusion  8. T2DM: Hgb A1c 7.7 - Remains on insulin 9. Pericardial tamponade: 4/23 return to OR for clot removal, chest wall left open. Patient had been on heparin gtt for DCCV of atrial flutter. Returned to the OR 5/10 for washout.   5/12 back to OR for muscle flap and chest closure.  10. ID: Has been on vancomycin/cefepime with open chest. Cefepime switched to meropenem on 5/7 WBCs rising. Concern for infection/developing sepsis.  Blood cultures negative so far. Trach aspirate + rare pseudomonas => Pseudomonas tracheitis versus PNA. HD catheter changed 5/7.  - Continue vancomycin/meropenem/fluconazole, per CCM.   13. Post-op anemia: Hgb down to 6.5. getting 1 unit of blood.   14. LUE Deficit- head CT negative. Unable to get MRI w/ sternal wires. He has been on and off heparin for surgeries. Will ask neurology to further evaluate    Lyda Jester PA-C  09/08/2019 7:59 AM  Patient seen with PA, agree with the above note.   Hgb down to 6.5, nurse has not noted overt bleeding source.  He had HD yesterday, not able to pull much due to  soft BP.  Tm 100.4 overnight.    Moving left side today but significantly less than right side.  CT head with not acute findings, unable to get MRI.   General: NAD, tracheostomy Neck: JVP 8-9 cm, no thyromegaly or thyroid nodule.  Lungs: Clear to auscultation bilaterally with normal respiratory effort. CV: Nondisplaced PMI.  Heart regular S1/S2, no S3/S4, no murmur.  No peripheral edema.   Abdomen: Soft, nontender, no hepatosplenomegaly, no distention.  Skin: Intact without lesions or rashes.  Neurologic: Alert, follows commands.  Left side weak.  Extremities: No clubbing or cyanosis.  HEENT: Normal.   Low grade fever, WBCs not elevated.  He has been on vancomycin/meropenem/fluconazole, had Pseudomonas in sputum.  - Will reculture blood/trach aspirate.   No definite bleeding source but hgb 6.5.  Getting 1 unit PRBCs today.   HD per nephrology, had yesterday.  May need midodrine to maintain MAP during HD, but BP ok currently.   Weak left side, concern for CVA.  Head CT negative and can't get MRI.  Has had paroxysmal atrial fibrillation.  Consistent anticoagulation has been difficult due to surgeries and anemia.  Anticoagulation currently on hold with surgery earlier this week and now hgb down to 6.5.  Will start if hgb stabilizes.  Will ask neurology to see.   Loralie Champagne 09/08/2019 8:30 AM

## 2019-09-08 NOTE — Progress Notes (Signed)
[facility] BRIEF SICU PROGRESS NOTE  2 Days Post-Op  S/P Procedure(s) (LRB): MEDIASTINAL EXPLORATION (N/A) STERNAL PLATING (N/A) LAPAROSCOPIC OMENTAL HARVEST (N/A) APPLICATION OF ACELL, APPLICATION OF WOUND VAC USING PREVENA INCISIONAL  DRESSING (N/A) Pectoralis ADVANCEMENT Flap   Stable day  Plan: Continue current plan  Rexene Alberts, MD 09/08/2019 7:19 PM

## 2019-09-08 NOTE — Anesthesia Postprocedure Evaluation (Signed)
Anesthesia Post Note  Patient: Christian [name]  Procedure(s) Performed: MEDIASTINAL EXPLORATION (N/A ) STERNAL PLATING (N/A ) LAPAROSCOPIC OMENTAL HARVEST (N/A ) APPLICATION OF ACELL, APPLICATION OF WOUND VAC USING PREVENA INCISIONAL  DRESSING (N/A ) Pectoralis ADVANCEMENT Flap     Patient location during evaluation: SICU Anesthesia Type: General Level of consciousness: sedated Pain management: pain level controlled Vital Signs Assessment: post-procedure vital signs reviewed and stable Respiratory status: patient remains intubated per anesthesia plan Cardiovascular status: stable Postop Assessment: no apparent nausea or vomiting Anesthetic complications: no    Last Vitals:  Vitals:   09/08/19 0945 09/08/19 1000  BP: (!) 133/59 (!) 144/64  Pulse: 96 100  Resp: (!) 21 (!) 31  Temp: 37.2 C   SpO2: 100% 100%    Last Pain:  Vitals:   09/08/19 0945  TempSrc: Oral  PainSc:                  [place]

## 2019-09-09 ENCOUNTER — Inpatient Hospital Stay (HOSPITAL_COMMUNITY): Payer: Medicare Other

## 2019-09-09 DIAGNOSIS — I255 Ischemic cardiomyopathy: Secondary | ICD-10-CM

## 2019-09-09 DIAGNOSIS — I10 Essential (primary) hypertension: Secondary | ICD-10-CM

## 2019-09-09 DIAGNOSIS — I63411 Cerebral infarction due to embolism of right middle cerebral artery: Secondary | ICD-10-CM

## 2019-09-09 DIAGNOSIS — I469 Cardiac arrest, cause unspecified: Secondary | ICD-10-CM

## 2019-09-09 LAB — CBC
HCT: 27.9 % — ABNORMAL LOW (ref 39.0–52.0)
HCT: 29.6 % — ABNORMAL LOW (ref 39.0–52.0)
Hemoglobin: 9 g/dL — ABNORMAL LOW (ref 13.0–17.0)
Hemoglobin: 9.6 g/dL — ABNORMAL LOW (ref 13.0–17.0)
MCH: 31 pg (ref 26.0–34.0)
MCH: 30.6 pg (ref 26.0–34.0)
MCHC: 32.3 g/dL (ref 30.0–36.0)
MCHC: 32.4 g/dL (ref 30.0–36.0)
MCV: 96.2 fL (ref 80.0–100.0)
MCV: 94.3 fL (ref 80.0–100.0)
Platelets: 207 10*3/uL (ref 150–400)
Platelets: 182 10*3/uL (ref 150–400)
RBC: 2.9 MIL/uL — ABNORMAL LOW (ref 4.22–5.81)
RBC: 3.14 MIL/uL — ABNORMAL LOW (ref 4.22–5.81)
RDW: 19.9 % — ABNORMAL HIGH (ref 11.5–15.5)
RDW: 20.2 % — ABNORMAL HIGH (ref 11.5–15.5)
WBC: 6.6 10*3/uL (ref 4.0–10.5)
WBC: 6.8 10*3/uL (ref 4.0–10.5)
nRBC: 0 % (ref 0.0–0.2)
nRBC: 0 % (ref 0.0–0.2)

## 2019-09-09 LAB — BPAM RBC
Blood Product Expiration Date: 202105192359
Blood Product Expiration Date: 202105212359
Blood Product Expiration Date: 202106132359
Blood Product Expiration Date: 202106162359
ISSUE DATE / TIME: 202105140614
ISSUE DATE / TIME: 202105141047
ISSUE DATE / TIME: 202105141953
ISSUE DATE / TIME: 202105150012
Unit Type and Rh: 5100
Unit Type and Rh: 5100
Unit Type and Rh: 5100
Unit Type and Rh: 9500

## 2019-09-09 LAB — RENAL FUNCTION PANEL
Albumin: 2.1 g/dL — ABNORMAL LOW (ref 3.5–5.0)
Anion gap: 16 — ABNORMAL HIGH (ref 5–15)
BUN: 90 mg/dL — ABNORMAL HIGH (ref 8–23)
CO2: 19 mmol/L — ABNORMAL LOW (ref 22–32)
Calcium: 8 mg/dL — ABNORMAL LOW (ref 8.9–10.3)
Chloride: 102 mmol/L (ref 98–111)
Creatinine, Ser: 5.92 mg/dL — ABNORMAL HIGH (ref 0.61–1.24)
GFR calc Af Amer: 10 mL/min — ABNORMAL LOW (ref >60)
GFR calc non Af Amer: 9 mL/min — ABNORMAL LOW (ref >60)
Glucose, Bld: 168 mg/dL — ABNORMAL HIGH (ref 70–99)
Phosphorus: 7.8 mg/dL — ABNORMAL HIGH (ref 2.5–4.6)
Potassium: 4.2 mmol/L (ref 3.5–5.1)
Sodium: 137 mmol/L (ref 135–145)

## 2019-09-09 LAB — TYPE AND SCREEN
ABO/RH(D): O POS
Antibody Screen: NEGATIVE
Unit division: 0
Unit division: 0
Unit division: 0
Unit division: 0

## 2019-09-09 LAB — MAGNESIUM: Magnesium: 2.5 mg/dL — ABNORMAL HIGH (ref 1.7–2.4)

## 2019-09-09 LAB — GLUCOSE, CAPILLARY
Glucose-Capillary: 146 mg/dL — ABNORMAL HIGH (ref 70–99)
Glucose-Capillary: 165 mg/dL — ABNORMAL HIGH (ref 70–99)
Glucose-Capillary: 128 mg/dL — ABNORMAL HIGH (ref 70–99)
Glucose-Capillary: 86 mg/dL (ref 70–99)
Glucose-Capillary: 112 mg/dL — ABNORMAL HIGH (ref 70–99)

## 2019-09-09 LAB — HEPATITIS B CORE ANTIBODY, IGM: Hep B C IgM: NONREACTIVE

## 2019-09-09 LAB — APTT: aPTT: 36 seconds (ref 24–36)

## 2019-09-09 LAB — PREPARE RBC (CROSSMATCH)

## 2019-09-09 LAB — HEPATITIS B SURFACE ANTIBODY,QUALITATIVE: Hep B S Ab: REACTIVE — AB

## 2019-09-09 MED ORDER — SODIUM BICARBONATE 650 MG PO TABS
650.0000 mg | ORAL_TABLET | Freq: Once | ORAL | Status: AC
  Administered 2019-09-09: 650 mg
  Filled 2019-09-09: qty 1

## 2019-09-09 MED ORDER — SODIUM CHLORIDE 0.9% IV SOLUTION: Freq: Once | INTRAVENOUS | Status: AC

## 2019-09-09 MED ORDER — HEPARIN SODIUM (PORCINE) 1000 UNIT/ML IJ SOLN
INTRAMUSCULAR | Status: AC
  Administered 2019-09-09: 2800 [IU] via INTRAVENOUS_CENTRAL
  Filled 2019-09-09: qty 4

## 2019-09-09 MED ORDER — HEPARIN (PORCINE) 25000 UT/250ML-% IV SOLN
1400.0000 [IU]/h | INTRAVENOUS | Status: DC
  Administered 2019-09-09: 850 [IU]/h via INTRAVENOUS
  Administered 2019-09-10: 1000 [IU]/h via INTRAVENOUS
  Administered 2019-09-11: 1300 [IU]/h via INTRAVENOUS
  Administered 2019-09-12: 1500 [IU]/h via INTRAVENOUS
  Filled 2019-09-09 (×4): qty 250

## 2019-09-09 MED ORDER — PANCRELIPASE (LIP-PROT-AMYL) 10440-39150 UNITS PO TABS
20880.0000 [IU] | ORAL_TABLET | Freq: Once | ORAL | Status: AC
  Administered 2019-09-09: 20880 [IU]
  Filled 2019-09-09 (×2): qty 2

## 2019-09-09 NOTE — Progress Notes (Signed)
[facility] course:  08/09/19 admitted for NSTEMI 4/20 CABG x4 4/23 cardiac arrest d/t tamponade 5/5 OR chest washout and tracheostomy placement 5/7 HD catheter placed 5/12 chest closure and muscle flap 5/13 left side weakness noted 5/14 neuro consulted for left UE weakness, CT x 2 no acute  INTERVAL HISTORY His dialysis tech is at the bedside.  Pt just had 2nd HD today, tolerating well. BP stable. He is still on vent with trach, but awake eyes open and nodding head for questions, able to follow all simple commands. CT repeat no acute finding. Overall deconditioning with generalized weakness, but may have mild LUE weakness comparing with right.   OBJECTIVE Vitals:   09/09/19 0500 09/09/19 0600 09/09/19 0700 09/09/19 0755  BP: (!) 144/62 140/65 (!) 136/58   Pulse: 88 88 88   Resp: 18 18 18    Temp: 98.8 F (37.1 C)   99.1 F (37.3 C)  TempSrc: Oral     SpO2: 100% 100% 100%   Weight: 88.3 kg     Height:        CBC:  Recent Labs  Lab 09/08/19 2150 09/09/19 0621  WBC 8.5 6.6  HGB 6.8* 9.0*  HCT 22.6* 27.9*  MCV 101.3* 96.2  PLT 214 062    Basic Metabolic Panel:  Recent Labs  Lab 09/08/19 0314 09/09/19 0621  NA 137 137  K 4.0 4.2  CL 101 102  CO2 25 19*  GLUCOSE 175* 168*  BUN 50* 90*  CREATININE 3.62* 5.92*  CALCIUM 8.1* 8.0*  MG 2.1 2.5*  PHOS 5.3* 7.8*    Lipid Panel:     Component Value Date/Time   CHOL 107 08/11/2019 0426   TRIG 71 08/27/2019 1611   HDL 42 08/11/2019 0426   CHOLHDL 2.5 08/11/2019 0426   VLDL 25 08/11/2019 0426   LDLCALC 40 08/11/2019 0426   HgbA1c:  Lab Results  Component Value Date   HGBA1C 7.7 (H) 08/14/2019   Urine Drug Screen: No results found for: LABOPIA, COCAINSCRNUR, LABBENZ, AMPHETMU, THCU, LABBARB  Alcohol Level No results found for: ETH  IMAGING   CT HEAD WO CONTRAST 09/09/2019 IMPRESSION:  Chronic microvascular ischemia and generalized atrophy without acute intracranial abnormality.    CT HEAD WO CONTRAST 09/07/2019 IMPRESSION:  1. No acute finding.  No visible infarct.  2. Atrophy and ventriculomegaly.   DG Chest Port 1 View 09/08/2019 Unchanged 1.4 cm nodule in the peripheral right upper lobe. No acute osseous abnormality.  IMPRESSION:  1. Stable left basilar atelectasis and effusion.    Transthoracic Echocardiogram (Limited) 08/19/2019 IMPRESSIONS  1. There prominent respiratory displacement of the interventricular  septum, consistent with enhanced ventricular interdependence, likely due  to constrictive physiology.. Left ventricular ejection fraction, by  estimation, is 45 to 50%. The left ventricle  has mildly decreased function. The left ventricle demonstrates global  hypokinesis. Left ventricular diastolic parameters are consistent with  Grade II diastolic dysfunction (pseudonormalization). Elevated left atrial  pressure.  2. Right ventricular systolic function was not well visualized. The right  ventricular size is normal. Tricuspid regurgitation signal is inadequate  for assessing PA pressure.  3. No pericardial fluid is see, but the pericardium is very thick and  thrombus in the pericardial space cannot be ecluded.  4. The mitral valve is normal in structure. No evidence of mitral valve  regurgitation.  5. The aortic valve is grossly normal. Aortic valve regurgitation is not  visualized. Mild aortic valve sclerosis is present,  with no evidence of  aortic valve stenosis.  Comparison(s):  Prior images reviewed side by side. The left ventricular  function has improved. The anteroseptal and apical left ventricular wall  motion abnormality is improved. There is abnormal septal motion consistent  with constrictive physiology.  ECG - 08/17/2019 - Aflutter ventricular response - 168 BPM. (See cardiology reading for complete details)  PHYSICAL EXAM  Temp:  [98.1 F (36.7 C)-99.3 F (37.4 C)] 98.2 F (36.8 C) (05/15 1203) Pulse Rate:  [76-99]  81 (05/15 1203) Resp:  [15-27] 22 (05/15 1203) BP: (63-144)/(45-72) 130/66 (05/15 1203) SpO2:  [98 %-100 %] 100 % (05/15 1203) Arterial Line BP: (123-148)/(50-59) 148/59 (05/14 1400) FiO2 (%):  [30 %-40 %] 40 % (05/15 1136) Weight:  [88.1 kg-89.9 kg] 88.1 kg (05/15 1203)  General - Well nourished, well developed, on trach with vent.  Ophthalmologic - fundi not visualized due to noncooperation.  Cardiovascular - Regular rate and rhythm.  Neuro - s/p trach on vent, eyes open, awake alert and following all simple commands. Lethargic but able to answer questions with nodding head. Eyes in mid position, able to gaze bilaterally, no gaze palsy, visual field full, blinking to visual threat bilaterally, PERRL. Corneal reflex present, gag and cough present. Breathing over the vent.  Facial symmetric, tongue protrusion half way in mouth at midline. RUE deltoid 1/5 and bicep3/5, finger grip 3/5. LUE deltoid 1/5, bicep 3-/5 with drift, finger grip 2/5. BLEs proximal 0/5 but knee flexion 3-/5 and toe DF 3/5. DTR 1+ and no babinski. Sensation symmetrical bilaterally, coordination and gait not tested.   ASSESSMENT/PLAN [name] is a 74 y.o. male with history of CAD, HTN, HLD, DM who presented initially on 4/14 for NSTEMI-> 4/20 CABG x4 -> 4/23 cardiac arrest d/t tamponade>5/5 -> OR chest washout and tracheostomy placement -> 5/7 HD catheter placed -> 5/12 chest closure and muscle flap. Neurology consulted on 5/14 for Left side weakness.    Possible right brain small stroke - not able to do MRI, but CT repeat no acute abnormality - stroke likely cardioembolic given significant cardiac hx  Resultant  Possible mild LUE weakness  CT head - 09/07/19 - No acute finding.  No visible infarct. Atrophy and ventriculomegaly.   CT Head repeat - 09/09/19 - Chronic microvascular ischemia and generalized atrophy without acute intracranial abnormality.   MRI brain not able to perform due to sternal plate s/p  CABG  CTA H&N - not ordered due to recent AKI with CRRT/HD - CTA this time will not change management - will hold off  Carotid Doppler - 08/11/19 - unremarkable  2D Echo 4/24 (Limited) - EF 45 to 50%. No cardiac source of emboli identified.   Sars Corona Virus 2 - negative  LDL - 40  HgbA1c - 7.7  VTE prophylaxis - SCDs  aspirin 81 mg daily prior to admission, now on aspirin 81 mg daily. Was on DAPT or heparin but now on ASA only given severe anemia needing PRBC. Agree with cardiology, consider heparin IV once Hb stable  Patient will be counseled to be compliant with his antithrombotic medications  Ongoing aggressive stroke risk factor management  Therapy recommendations:  LTACH  Disposition:  Pending  Aflutter  Post op aflutter  S/p cardioversion 4/22  Was on heparin IV but off AC due to bleeding with anemia needing PRBC  On ASA 81 and amiodarone now  Agree with cardiology, consider heparin IV once Hb stable  NSTEMI s/p CABG Cardiac arrest  Cardiology and cariovascular surgery on board  On ASA   Was on DAPT but off due to severe anemia  EF 45-50%  Agree with heparin IV once Hb stable  AKI   Nephrology on board  Was on CRRT  Now on HD started 5/13  Will hold off CTA head and neck as it will not change management  Severe anemia   Anemia - Hb - 6.8 -PRBC- 9.0  No overt bleeding today  Agree with cardiology, consider heparin IV once Hb stable  Fever and leukocytosis  Tmax 100.6->afebrile  WBC 14.3->...->6.6  Was on meropenum, now off  Hypotension  Hx of Hypertension  Home BP meds: Catapres ; metoprolol ; Benicar HCT ; Verapanil  Now on midodrine  Stable . Long-term BP goal normotensive  Hyperlipidemia  Home Lipid lowering medication: Lipitor 20 mg daily  LDL 40, goal < 70  Current lipid lowering medication: Lipitor 40 mg daily   Continue statin at discharge  AST/ALT rebound to 72/127  Hep B antibody pending  May hold off  or decrease statin dose if LFT continues to trending up  Diabetes  Home diabetic meds: metformin ; Jardiance  SSI  CBG monitoring  HgbA1c 7.7, goal < 7.0  On levemir  Close PCP follow up  Dysphagia   On TF  Speech to follow  Other Stroke Risk Factors  Advanced age  Former cigarette smoker - quit  ETOH use, advised to drink no more than 1 alcoholic beverage per day.  Overweight, Body mass index is 28.75 kg/m., recommend weight loss, diet and exercise as appropriate   Other Active Problems  Code status - Full code  Possible small right lower lobe PE: Chest CT 4/14  Hospital day # 31  Neurology will sign off. Please call with questions. Pt will follow up with stroke clinic at [facility] in about 4 weeks after discharge.  Rosalin Hawking, MD PhD Stroke Neurology 09/09/2019 12:48 PM   To contact Stroke Continuity provider, please refer to [email or web address] After hours, contact General Neurology

## 2019-09-09 NOTE — Progress Notes (Signed)
Patient ID: Christian [name], male   DOB: 1945/10/16, 74 y.o.   MRN: [id number]     Advanced Heart Failure Rounding Note  PCP-Cardiologist: No primary care provider on file.   Subjective:    08/09/19 Admitted with NSTEM and ? Small RLL PE. Weight 213 pounds.  08/15/19 CABG x4 on 08/15/19 08/18/19 Cardiac arrest due to tamponade. Chest opened for washout 08/20/19 Echo post-pericardial evacuation: EF 40-45% with some residual pericardial clot. 4/26 Chest washout in OR 4/29 CVVH started 5/5 OR for chest washout, tracheostomy 5/7 HD catheter replaced 5/10 OR for washout/dressing change.  5/12 To OR for chest closure with muscle flap  Continues w/ left sided weakness but improved.  Head CT 5/13 and again 5/15 unremarkable. Unable to get brain MRI due to sternal wires.    Hgb up to 9 this morning, had 2 more units PRBCs last night.   MAP stable, he is on midodrine 10 tid. CVP 12.  Remains on vent through trach. FiO2 30%. PEEP 5.   Trach aspirate + rare pseudomonas. WBCs now normal, afebrile.  Remains on meropenem and fluconazole.   Objective:   Weight Range: 88.3 kg Body mass index is 28.75 kg/m.   Vital Signs:   Temp:  [97.7 F (36.5 C)-100.6 F (38.1 C)] 98.8 F (37.1 C) (05/15 0500) Pulse Rate:  [76-102] 88 (05/15 0700) Resp:  [15-31] 18 (05/15 0700) BP: (91-144)/(45-72) 136/58 (05/15 0700) SpO2:  [98 %-100 %] 100 % (05/15 0700) Arterial Line BP: (112-153)/(39-59) 148/59 (05/14 1400) FiO2 (%):  [30 %] 30 % (05/15 0400) Weight:  [88.3 kg] 88.3 kg (05/15 0500) Last BM Date: 09/08/19  Weight change: Filed Weights   09/07/19 1515 09/08/19 0402 09/09/19 0500  Weight: 83.8 kg 87.1 kg 88.3 kg    Intake/Output:   Intake/Output Summary (Last 24 hours) at 09/09/2019 0749 Last data filed at 09/09/2019 0700 Gross per 24 hour  Intake 2729.07 ml  Output 795 ml  Net 1934.07 ml      Physical Exam   General: NAD Neck: JVP 8-9 cm, no thyromegaly or thyroid nodule.  Lungs:  Decreased at bases.  CV: Nondisplaced PMI.  Heart regular S1/S2, no S3/S4, no murmur.  No peripheral edema.  No carotid bruit.  Normal pedal pulses.  Abdomen: Soft, nontender, no hepatosplenomegaly, no distention.  Skin: Intact without lesions or rashes.  Neurologic: Sleeping this morning. Extremities: No clubbing or cyanosis.  HEENT: Normal.    Telemetry   NSR 90s (personally reviewed)  Labs    CBC Recent Labs    09/08/19 2150 09/09/19 0621  WBC 8.5 6.6  HGB 6.8* 9.0*  HCT 22.6* 27.9*  MCV 101.3* 96.2  PLT 214 510   Basic Metabolic Panel Recent Labs    09/08/19 0314 09/09/19 0621  NA 137 137  K 4.0 4.2  CL 101 102  CO2 25 19*  GLUCOSE 175* 168*  BUN 50* 90*  CREATININE 3.62* 5.92*  CALCIUM 8.1* 8.0*  MG 2.1 2.5*  PHOS 5.3* 7.8*   Liver Function Tests Recent Labs    09/07/19 0223 09/07/19 0707 09/08/19 0314 09/09/19 0621  AST 72*  --   --   --   ALT 127*  --   --   --   ALKPHOS 193*  --   --   --   BILITOT 1.1  --   --   --   PROT 5.5*  --   --   --   ALBUMIN 2.2*   < >  2.2* 2.1*   < > = values in this interval not displayed.   No results for input(s): LIPASE, AMYLASE in the last 72 hours. Cardiac Enzymes No results for input(s): CKTOTAL, CKMB, CKMBINDEX, TROPONINI in the last 72 hours.  BNP: BNP (last 3 results) Recent Labs    08/09/19 1240 09/02/19 0311  BNP 89.1 761.0*    ProBNP (last 3 results) Recent Labs    11/23/18 1120 08/09/19 0922  PROBNP 18.0 78.0     D-Dimer No results for input(s): DDIMER in the last 72 hours. Hemoglobin A1C No results for input(s): HGBA1C in the last 72 hours. Fasting Lipid Panel No results for input(s): CHOL, HDL, LDLCALC, TRIG, CHOLHDL, LDLDIRECT in the last 72 hours. Thyroid Function Tests No results for input(s): TSH, T4TOTAL, T3FREE, THYROIDAB in the last 72 hours.  Invalid input(s): FREET3  Other results:   Imaging    CT HEAD WO CONTRAST  Result Date: 09/09/2019 CLINICAL DATA:   Stroke follow-up EXAM: CT HEAD WITHOUT CONTRAST TECHNIQUE: Contiguous axial images were obtained from the base of the skull through the vertex without intravenous contrast. COMPARISON:  Head CT 09/07/2019 FINDINGS: Brain: There is no mass, hemorrhage or extra-axial collection. There is generalized atrophy without lobar predilection. Hypodensity of the white matter is most commonly associated with chronic microvascular disease. Vascular: No abnormal hyperdensity of the major intracranial arteries or dural venous sinuses. No intracranial atherosclerosis. Skull: The visualized skull base, calvarium and extracranial soft tissues are normal. Sinuses/Orbits: No fluid levels or advanced mucosal thickening of the visualized paranasal sinuses. No mastoid or middle ear effusion. The orbits are normal. IMPRESSION: Chronic microvascular ischemia and generalized atrophy without acute intracranial abnormality. Electronically Signed   By: Ulyses Jarred M.D.   On: 09/09/2019 04:33     Medications:     Scheduled Medications: .  stroke: mapping our early stages of recovery book   Does not apply Once  . sodium chloride   Intravenous Once  . sodium chloride   Intravenous Once  . amiodarone  200 mg Per Tube BID  . aspirin  81 mg Per Tube Daily  . atorvastatin  40 mg Per Tube Daily  . B-complex with vitamin C  1 tablet Per Tube Daily  . bisacodyl  10 mg Oral Daily   Or  . bisacodyl  10 mg Rectal Daily  . chlorhexidine gluconate (MEDLINE KIT)  15 mL Mouth Rinse BID  . Chlorhexidine Gluconate Cloth  6 each Topical Q0600  . clopidogrel  75 mg Per Tube Daily  . darbepoetin (ARANESP) injection - NON-DIALYSIS  150 mcg Subcutaneous Q Mon-1800  . docusate  100 mg Per Tube BID  . feeding supplement (PRO-STAT SUGAR FREE 64)  60 mL Per Tube TID  . insulin aspart  0-24 Units Subcutaneous Q4H  . insulin detemir  10 Units Subcutaneous BID  . mouth rinse  15 mL Mouth Rinse 10 times per day  . midodrine  10 mg Oral TID WC  .  pantoprazole sodium  40 mg Per Tube Daily  . sertraline  25 mg Oral Daily  . sodium chloride flush  10-40 mL Intracatheter Q12H  . sodium chloride flush  10-40 mL Intracatheter Q12H  . trospium  20 mg Per Tube BID    Infusions: . sodium chloride 250 mL (09/07/19 2033)  . sodium chloride    . sodium chloride    . feeding supplement (VITAL 1.5 CAL) 50 mL/hr at 09/09/19 0700  . lactated ringers 20 mL/hr at  08/20/19 1200    PRN Medications: sodium chloride, sodium chloride, acetaminophen, ALPRAZolam, alteplase, cromolyn, dextrose, heparin, hydrALAZINE, HYDROmorphone (DILAUDID) injection, lidocaine (PF), lidocaine-prilocaine, ondansetron (ZOFRAN) IV, oxyCODONE, pentafluoroprop-tetrafluoroeth, sodium chloride flush, sodium chloride flush     Assessment/Plan   1. CAD: s/p PDA angioplasty + stent in 02/2014.  Admitted for NSTEMI 4/14. Hs Trop peaked at 3,309.  LHC w/ severe 3V CAD. Echo w/ reduced EF 30-35%. RV ok.  S/p CABG x 4 on 4/20 (LIMA-LAD, RIMA to PDA, Lt radial artery graft to 1st OM and distal OM branches of LCx).  Chest reopened urgently at the bedside for cardiac tamponade and removal of clot on 4/23  - Has been on ASA. Will stop Plavix as he will need long-term anticoagulation and has anemia.  - Atorvastatin 40 daily.   - S/P muscle flap by plastic surgery  2. Acute Systolic Heart Failure: Ischemic Cardiomyopathy -> cardiogenic shock.  LVEF severely reduced at 30-35%, in the setting of NSTEM/ Multivessel CAD, RV ok initially.  Underwent urgent sternotomy at the bedside for cardiac tamponade and removal of clot on 4/23. Chest remains open. Luiz Blare numbers have been concerning for RV failure (swan now out).  Echo 4/24 EF 40 to 45% with inotropic support, small amount of residual pericardial clot, RV moderately hypokinetic.  CVP 12 today, now getting iHD.   - On midodrine to facilitate HD.  - Now on iHD for volume control - Will not add additional cardioactive meds with soft BP with HD.    3.  Postoperative acute hypoxic respiratory failure: Reintubated on 4/23 due to cardiogenic shock and return to OR. Now with tracheostomy, FiO2 30% .  -  Per CCM.  4. AKI: Baseline SCr 0.9. Creatinine peaked to 5.3, suspect ATN in setting of cardiogenic shock/tamponade.  - on iHD. Management per nephrology 5. Shock liver: Due to cardiac arrest on 4/23.  LFTs trending down.  6. 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 Maintaining NSR with PACs.   - Continue amiodarone per tube. - Off heparin due to bleeding/anemia.  No overt bleeding, can restart heparin gtt this afternoon if pm hgb is stable.  - Eventual transition to [place].  7.  Possible small right lower lobe PE: Chest CT 4/14 w/ small RLL PE.  Bilateral Venous Dopplers negative for DVT.  8. T2DM: Hgb A1c 7.7 - Remains on insulin 9. Pericardial tamponade: 4/23 return to OR for clot removal, chest wall left open. Patient had been on heparin gtt for DCCV of atrial flutter. Returned to the OR 5/10 for washout.   5/12 back to OR for muscle flap and chest closure.  10. ID: Has been on vancomycin/cefepime with open chest. Cefepime switched to meropenem on 5/7 WBCs rising. Concern for infection/developing sepsis.  Blood cultures negative so far. Trach aspirate + rare pseudomonas => Pseudomonas tracheitis versus PNA. HD catheter changed 5/7.  - Meropenem and fluconazole ongoing, will discuss length of course with pharmacy.    13. Post-op anemia: He has had 4 units PRBCs last 4 days, no overt bleeding however. - If afternoon hgb is stable, carefully restart heparin gtt.  14. Neuro: LUE weakness.  Actually has seemed improved.  CTs x 2 without definite CVA, cannot get MRI. Neurology following.  - Restart heparin gtt when hgb stabilizes.   CRITICAL CARE Performed by: Loralie Champagne  Total critical care time: 35 minutes  Critical care time was exclusive of separately billable procedures and treating other patients.  Critical care was necessary  to  treat or prevent imminent or life-threatening deterioration.  Critical care was time spent personally by me on the following activities: development of treatment plan with patient and/or surrogate as well as nursing, discussions with consultants, evaluation of patient's response to treatment, examination of patient, obtaining history from patient or surrogate, ordering and performing treatments and interventions, ordering and review of laboratory studies, ordering and review of radiographic studies, pulse oximetry and re-evaluation of patient's condition.   Loralie Champagne 09/09/2019 7:49 AM

## 2019-09-09 NOTE — Progress Notes (Signed)
Patient ID: Christian [name], male   DOB: 09/21/1945, 74 y.o.   MRN: [id number]   S: Left-sided weakness noted by primary team. Neurology consulted. Repeat CT head yesterday without acute abnormality. Expressed complaints today. Otherwise no significant changes. Planning for dialysis today.   O:BP 135/72   Pulse 85   Temp 99.1 F (37.3 C) (Oral)   Resp 18   Ht 5' 9"  (1.753 m)   Wt 89.9 kg   SpO2 100%   BMI 29.27 kg/m   Intake/Output Summary (Last 24 hours) at 09/09/2019 1019 Last data filed at 09/09/2019 0700 Gross per 24 hour  Intake 1987.07 ml  Output 795 ml  Net 1192.07 ml   Intake/Output: I/O last 3 completed shifts: In: 3779.2 [I.V.:120; Blood:1079; NG/GT:1830; IV Piggyback:750.2] Out: 1510 [Drains:145; AJOIN:8676]  Intake/Output this shift:  No intake/output data recorded. Weight change: 4.1 kg Gen:  Alert on vent via trach left subclavian HD cath placed 5/7 CVS: Normal rate, open chest wound with wound vac in place Resp: cta, bilateral chest rise Abd: distended, +BS, soft, NT  Ext: 1+ edema  Recent Labs  Lab 09/05/19 1630 09/05/19 1630 09/06/19 0404 09/06/19 1648 09/07/19 0029 09/07/19 0223 09/07/19 0707 09/08/19 0314 09/09/19 0621  NA 139   < > 138 138 136 136 137 137 137  K 4.3   < > 4.5 5.1 5.6* 6.2* 5.8* 4.0 4.2  CL 102  --  102 105  --  103 104 101 102  CO2 26  --  26 24  --  22 21* 25 19*  GLUCOSE 164*  --  155* 127*  --  116* 112* 175* 168*  BUN 30*  --  28* 32*  --  47* 52* 50* 90*  CREATININE 1.94*  --  1.92* 2.60*  --  3.68* 4.32* 3.62* 5.92*  ALBUMIN 2.4*  --  2.4* 2.3*  --  2.2* 2.2* 2.2* 2.1*  CALCIUM 8.4*  --  8.5* 8.6*  --  8.5* 8.8* 8.1* 8.0*  PHOS 3.5  --  2.7 4.5  --  5.3* 5.9* 5.3* 7.8*  AST  --   --   --   --   --  72*  --   --   --   ALT  --   --   --   --   --  127*  --   --   --    < > = values in this interval not displayed.   Liver Function Tests: Recent Labs  Lab 09/07/19 0223 09/07/19 0223 09/07/19 0707 09/08/19 0314  09/09/19 0621  AST 72*  --   --   --   --   ALT 127*  --   --   --   --   ALKPHOS 193*  --   --   --   --   BILITOT 1.1  --   --   --   --   PROT 5.5*  --   --   --   --   ALBUMIN 2.2*   < > 2.2* 2.2* 2.1*   < > = values in this interval not displayed.   No results for input(s): LIPASE, AMYLASE in the last 168 hours. No results for input(s): AMMONIA in the last 168 hours. CBC: Recent Labs  Lab 09/07/19 0223 09/07/19 0223 09/08/19 0314 09/08/19 0314 09/08/19 1907 09/08/19 2150 09/09/19 0621  WBC 14.3*   < > 9.2   < > 8.6 8.5 6.6  HGB 8.2*   < >  6.5*   < > 6.7* 6.8* 9.0*  HCT 26.6*   < > 21.0*   < > 21.7* 22.6* 27.9*  MCV 100.4*  --  100.5*  --  100.5* 101.3* 96.2  PLT 203   < > 186   < > 201 214 207   < > = values in this interval not displayed.   Cardiac Enzymes: No results for input(s): CKTOTAL, CKMB, CKMBINDEX, TROPONINI in the last 168 hours. CBG: Recent Labs  Lab 09/08/19 1551 09/08/19 1927 09/08/19 2330 09/09/19 0334 09/09/19 0753  GLUCAP 106* 187* 177* 146* 165*    Iron Studies: No results for input(s): IRON, TIBC, TRANSFERRIN, FERRITIN in the last 72 hours. Studies/Results: CT HEAD WO CONTRAST  Result Date: 09/09/2019 CLINICAL DATA:  Stroke follow-up EXAM: CT HEAD WITHOUT CONTRAST TECHNIQUE: Contiguous axial images were obtained from the base of the skull through the vertex without intravenous contrast. COMPARISON:  Head CT 09/07/2019 FINDINGS: Brain: There is no mass, hemorrhage or extra-axial collection. There is generalized atrophy without lobar predilection. Hypodensity of the white matter is most commonly associated with chronic microvascular disease. Vascular: No abnormal hyperdensity of the major intracranial arteries or dural venous sinuses. No intracranial atherosclerosis. Skull: The visualized skull base, calvarium and extracranial soft tissues are normal. Sinuses/Orbits: No fluid levels or advanced mucosal thickening of the visualized paranasal sinuses.  No mastoid or middle ear effusion. The orbits are normal. IMPRESSION: Chronic microvascular ischemia and generalized atrophy without acute intracranial abnormality. Electronically Signed   By: Ulyses Jarred M.D.   On: 09/09/2019 04:33   DG Chest Port 1 View  Result Date: 09/08/2019 CLINICAL DATA:  Status post CABG and mediastinal washout. EXAM: PORTABLE CHEST 1 VIEW COMPARISON:  Chest x-ray from yesterday. FINDINGS: Unchanged tracheostomy and feeding tubes. Unchanged right internal jugular and left subclavian central venous catheters. Unchanged right chest tube. Stable cardiomegaly status post CABG. Normal pulmonary vascularity. Unchanged retrocardiac opacity and small left pleural effusion. No pneumothorax. Unchanged 1.4 cm nodule in the peripheral right upper lobe. No acute osseous abnormality. IMPRESSION: 1. Stable left basilar atelectasis and effusion. Electronically Signed   By: Titus Dubin M.D.   On: 09/08/2019 08:05   .  stroke: mapping our early stages of recovery book   Does not apply Once  . sodium chloride   Intravenous Once  . sodium chloride   Intravenous Once  . amiodarone  200 mg Per Tube BID  . aspirin  81 mg Per Tube Daily  . atorvastatin  40 mg Per Tube Daily  . B-complex with vitamin C  1 tablet Per Tube Daily  . bisacodyl  10 mg Oral Daily   Or  . bisacodyl  10 mg Rectal Daily  . chlorhexidine gluconate (MEDLINE KIT)  15 mL Mouth Rinse BID  . Chlorhexidine Gluconate Cloth  6 each Topical Q0600  . darbepoetin (ARANESP) injection - NON-DIALYSIS  150 mcg Subcutaneous Q Mon-1800  . docusate  100 mg Per Tube BID  . feeding supplement (PRO-STAT SUGAR FREE 64)  60 mL Per Tube TID  . heparin sodium (porcine)      . insulin aspart  0-24 Units Subcutaneous Q4H  . insulin detemir  10 Units Subcutaneous BID  . mouth rinse  15 mL Mouth Rinse 10 times per day  . midodrine  10 mg Oral TID WC  . pantoprazole sodium  40 mg Per Tube Daily  . sertraline  25 mg Oral Daily  . sodium  chloride flush  10-40 mL Intracatheter  Q12H  . sodium chloride flush  10-40 mL Intracatheter Q12H  . trospium  20 mg Per Tube BID    BMET    Component Value Date/Time   NA 137 09/09/2019 0621   NA 141 06/19/2019 1130   K 4.2 09/09/2019 0621   CL 102 09/09/2019 0621   CO2 19 (L) 09/09/2019 0621   GLUCOSE 168 (H) 09/09/2019 0621   BUN 90 (H) 09/09/2019 0621   BUN 21 06/19/2019 1130   CREATININE 5.92 (H) 09/09/2019 0621   CALCIUM 8.0 (L) 09/09/2019 0621   GFRNONAA 9 (L) 09/09/2019 0621   GFRAA 10 (L) 09/09/2019 0621   CBC    Component Value Date/Time   WBC 6.6 09/09/2019 0621   RBC 2.90 (L) 09/09/2019 0621   HGB 9.0 (L) 09/09/2019 0621   HCT 27.9 (L) 09/09/2019 0621   PLT 207 09/09/2019 0621   MCV 96.2 09/09/2019 0621   MCH 31.0 09/09/2019 0621   MCHC 32.3 09/09/2019 0621   RDW 19.9 (H) 09/09/2019 0621   LYMPHSABS 1.1 09/02/2019 0311   MONOABS 1.5 (H) 09/02/2019 0311   EOSABS 0.3 09/02/2019 0311   BASOSABS 0.1 09/02/2019 0311     Assessment/Plan:  1. Shock- improved now on meripenem, vanc, and fluconazole.  No pressors 2. AKI- (crt 1.22 on 07/24/19) following cardiac cath, cabg, cardiogenic shock, cardiac arrest due to tamponade, and acute on chronic CHF. Started CRRT on 08/24/19-5/12 due to extensive volume overload presenting chest closure. Now s/p HD x1 on 5/13 and toelrated well.  1. Plan for hemodialysis today 2. UF 1.5L as tolerated 2. Left subclavian trialysis catheter placed by PCCM on 5/7 3. No UOP-  Unclear if recovery is possible given major insult 3. 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 Acute systolic heart failure due to ischemic cardiomyopathy- volume markedly improved with CRRT. Will cont UF w/ HD since tolerating and no pressors 5. Postoperative acute hypoxic respiratory failure- reintubated 08/18/19 and likely aspiration. Now trach 6. Anemia- Hgb worsened today.  transfuse prn.added ESA- darbe 150 7. Ileus-  Remains distended. Per primary. 8. Elytes-  Phos elevated today to 7.8. Will consider binder tomorrow if it remains elevate. Potassium within normal limits  Reesa Chew  [employer] [phone]

## 2019-09-09 NOTE — Progress Notes (Signed)
Transported pt to CT and back with RN without any complications

## 2019-09-09 NOTE — Progress Notes (Signed)
SLP Cancellation Note  Patient Details Name: Christian [name] MRN: [id number] DOB: Jun 11, 1945   Cancelled treatment:       Reason Eval/Treat Not Completed: Medical issues which prohibited therapy. Received orders for cognitive linguistic eval as part of stroke order set. Pt remains trached and vented. Will follow for readiness for assessment. When pt is on trach collar, PMSV orders also appropriate.    Rafael Salway, Katherene Ponto 09/09/2019, 7:41 AM

## 2019-09-09 NOTE — Progress Notes (Signed)
ANTICOAGULATION CONSULT NOTE - Follow Up Consult  Pharmacy Consult for heparin Indication:  pulmonary embolus / suspect small stroke   Labs: Recent Labs    09/07/19 0223 09/07/19 0223 09/07/19 0707 09/08/19 0314 09/08/19 1907 09/08/19 2150 09/08/19 2150 09/09/19 0621 09/09/19 1555  HGB 8.2*   < >  --  6.5*   < > 6.8*   < > 9.0* 9.6*  HCT 26.6*   < >  --  21.0*   < > 22.6*  --  27.9* 29.6*  PLT 203   < >  --  186   < > 214  --  207 182  APTT 37*  --   --  41*  --   --   --  36  --   CREATININE 3.68*   < > 4.32* 3.62*  --   --   --  5.92*  --    < > = values in this interval not displayed.    Assessment: 74yo male s/p NSTEMI > CABG - complicated post op course.  CT 4/14 small RLL PE has been off heparin since 5/7 for HD cath removal and drop hgb and open chest now closed.   Hemoglobin stable tonight 9.6, pltc 180s.   Plan to restart heparin this evening  - start low and slowly titrate to goal  Goal of Therapy:  Heparin level 0.3-0.5    Plan:  Resume heparin drip 850 uts/hr  Daily HL, CBC  Monitor s/s bleeding    Bonnita Nasuti Pharm.D. CPP, BCPS Clinical Pharmacist [phone] 09/09/2019 7:07 PM

## 2019-09-09 NOTE — Progress Notes (Signed)
[address]       [place] [address]             [phone]        CARDIOTHORACIC SURGERY PROGRESS NOTE   R3 Days Post-Op Procedure(s) (LRB): MEDIASTINAL EXPLORATION (N/A) STERNAL PLATING (N/A) LAPAROSCOPIC OMENTAL HARVEST (N/A) APPLICATION OF ACELL, APPLICATION OF WOUND VAC USING PREVENA INCISIONAL  DRESSING (N/A) Pectoralis ADVANCEMENT Flap  Subjective: Wakes up on vent.  Denies pain.  Objective: Vital signs: BP Readings from Last 1 Encounters:  09/09/19 140/69   Pulse Readings from Last 1 Encounters:  09/09/19 83   Resp Readings from Last 1 Encounters:  09/09/19 18   Temp Readings from Last 1 Encounters:  09/09/19 99.1 F (37.3 C) (Oral)    Hemodynamics: CVP:  [6 mmHg-12 mmHg] 10 mmHg  Physical Exam:  Rhythm:   sinus  Breath sounds: coarse  Heart sounds:  RRR  Incisions:  Wound VAC intact  Abdomen:  Soft, non-distended, non-tender, tolerating tube feeds  Extremities:  Warm, well-perfused   Intake/Output from previous day: 05/14 0701 - 05/15 0700 In: 2729.1 [I.V.:120; Blood:1079; NG/GT:1230; IV Piggyback:300.1] Out: 795 [Drains:105; Stool:690] Intake/Output this shift: No intake/output data recorded.  Lab Results:  CBC: Recent Labs    09/08/19 2150 09/09/19 0621  WBC 8.5 6.6  HGB 6.8* 9.0*  HCT 22.6* 27.9*  PLT 214 207    BMET:  Recent Labs    09/08/19 0314 09/09/19 0621  NA 137 137  K 4.0 4.2  CL 101 102  CO2 25 19*  GLUCOSE 175* 168*  BUN 50* 90*  CREATININE 3.62* 5.92*  CALCIUM 8.1* 8.0*     PT/INR:  No results for input(s): LABPROT, INR in the last 72 hours.  CBG (last 3)  Recent Labs    09/08/19 2330 09/09/19 0334 09/09/19 0753  GLUCAP 177* 146* 165*    ABG    Component Value Date/Time   PHART 7.481 (H) 09/07/2019 0029   PCO2ART 31.4 (L) 09/07/2019 0029   PO2ART 125 (H) 09/07/2019 0029   HCO3 23.3 09/07/2019 0029   TCO2 24 09/07/2019 0029   ACIDBASEDEF 5.0 (H) 08/18/2019 2341   O2SAT  99.0 09/07/2019 0029    CXR: N/A\   CT HEAD WITHOUT CONTRAST  TECHNIQUE: Contiguous axial images were obtained from the base of the skull through the vertex without intravenous contrast.  COMPARISON:  Head CT 09/07/2019  FINDINGS: Brain: There is no mass, hemorrhage or extra-axial collection. There is generalized atrophy without lobar predilection. Hypodensity of the white matter is most commonly associated with chronic microvascular disease.  Vascular: No abnormal hyperdensity of the major intracranial arteries or dural venous sinuses. No intracranial atherosclerosis.  Skull: The visualized skull base, calvarium and extracranial soft tissues are normal.  Sinuses/Orbits: No fluid levels or advanced mucosal thickening of the visualized paranasal sinuses. No mastoid or middle ear effusion. The orbits are normal.  IMPRESSION: Chronic microvascular ischemia and generalized atrophy without acute intracranial abnormality.   Electronically Signed   By: Ulyses Jarred M.D.   On: 09/09/2019 04:33  Assessment/Plan: S/P Procedure(s) (LRB): MEDIASTINAL EXPLORATION (N/A) STERNAL PLATING (N/A) LAPAROSCOPIC OMENTAL HARVEST (N/A) APPLICATION OF ACELL, APPLICATION OF WOUND VAC USING PREVENA INCISIONAL  DRESSING (N/A) Pectoralis ADVANCEMENT Flap  Clinically stable NSR w/ stable BP on midodrine, no drips, on amiodarone via tube VDRF s/p trach w/ O2 sats 100% on 40% FiO2 Acute systolic CHF  ARF - tolerating HD Open chest s/p sternal plating and  flap closure Malnutrition, tolerating tube feeds Severe deconditioning Question left sided weakness - head CT negative for acute stroke Anemia stable Afebrile w/ normal WBC currently off ABx   Continue current plan   Rexene Alberts, MD 09/09/2019 11:11 AM

## 2019-09-10 ENCOUNTER — Inpatient Hospital Stay (HOSPITAL_COMMUNITY): Payer: Medicare Other

## 2019-09-10 LAB — TYPE AND SCREEN
ABO/RH(D): O POS
Antibody Screen: NEGATIVE
Unit division: 0
Unit division: 0

## 2019-09-10 LAB — CBC
HCT: 29.4 % — ABNORMAL LOW (ref 39.0–52.0)
Hemoglobin: 9.5 g/dL — ABNORMAL LOW (ref 13.0–17.0)
MCH: 30.6 pg (ref 26.0–34.0)
MCHC: 32.3 g/dL (ref 30.0–36.0)
MCV: 94.8 fL (ref 80.0–100.0)
Platelets: 222 10*3/uL (ref 150–400)
RBC: 3.1 MIL/uL — ABNORMAL LOW (ref 4.22–5.81)
RDW: 20.4 % — ABNORMAL HIGH (ref 11.5–15.5)
WBC: 5.4 10*3/uL (ref 4.0–10.5)
nRBC: 0 % (ref 0.0–0.2)

## 2019-09-10 LAB — BPAM RBC
Blood Product Expiration Date: 202106122359
Blood Product Expiration Date: 202106162359
ISSUE DATE / TIME: 202105150012
ISSUE DATE / TIME: 202105150012
Unit Type and Rh: 5100
Unit Type and Rh: 5100

## 2019-09-10 LAB — RENAL FUNCTION PANEL
Albumin: 2 g/dL — ABNORMAL LOW (ref 3.5–5.0)
Anion gap: 14 (ref 5–15)
BUN: 56 mg/dL — ABNORMAL HIGH (ref 8–23)
CO2: 24 mmol/L (ref 22–32)
Calcium: 8.2 mg/dL — ABNORMAL LOW (ref 8.9–10.3)
Chloride: 100 mmol/L (ref 98–111)
Creatinine, Ser: 4.65 mg/dL — ABNORMAL HIGH (ref 0.61–1.24)
GFR calc Af Amer: 14 mL/min — ABNORMAL LOW (ref >60)
GFR calc non Af Amer: 12 mL/min — ABNORMAL LOW (ref >60)
Glucose, Bld: 121 mg/dL — ABNORMAL HIGH (ref 70–99)
Phosphorus: 5.6 mg/dL — ABNORMAL HIGH (ref 2.5–4.6)
Potassium: 3.8 mmol/L (ref 3.5–5.1)
Sodium: 138 mmol/L (ref 135–145)

## 2019-09-10 LAB — HEPARIN LEVEL (UNFRACTIONATED)
Heparin Unfractionated: <0.1 IU/mL — ABNORMAL LOW (ref 0.30–0.70)
Heparin Unfractionated: <0.1 IU/mL — ABNORMAL LOW (ref 0.30–0.70)

## 2019-09-10 LAB — GLUCOSE, CAPILLARY
Glucose-Capillary: 118 mg/dL — ABNORMAL HIGH (ref 70–99)
Glucose-Capillary: 135 mg/dL — ABNORMAL HIGH (ref 70–99)
Glucose-Capillary: 138 mg/dL — ABNORMAL HIGH (ref 70–99)
Glucose-Capillary: 150 mg/dL — ABNORMAL HIGH (ref 70–99)
Glucose-Capillary: 156 mg/dL — ABNORMAL HIGH (ref 70–99)
Glucose-Capillary: 157 mg/dL — ABNORMAL HIGH (ref 70–99)
Glucose-Capillary: 162 mg/dL — ABNORMAL HIGH (ref 70–99)

## 2019-09-10 LAB — APTT: aPTT: 37 seconds — ABNORMAL HIGH (ref 24–36)

## 2019-09-10 LAB — MAGNESIUM: Magnesium: 2.2 mg/dL (ref 1.7–2.4)

## 2019-09-10 NOTE — Progress Notes (Signed)
      [address]       [place] [address]             [phone]        CARDIOTHORACIC SURGERY PROGRESS NOTE   R4 Days Post-Op Procedure(s) (LRB): MEDIASTINAL EXPLORATION (N/A) STERNAL PLATING (N/A) LAPAROSCOPIC OMENTAL HARVEST (N/A) APPLICATION OF ACELL, APPLICATION OF WOUND VAC USING PREVENA INCISIONAL  DRESSING (N/A) Pectoralis ADVANCEMENT Flap  Subjective: Sleeping but wakes up, opens eyes and seems to respond appropriately.  Moving a little more.  Stable on vent w/out any efforts to wean.  Objective: Vital signs: BP Readings from Last 1 Encounters:  09/10/19 (!) 156/58   Pulse Readings from Last 1 Encounters:  09/10/19 94   Resp Readings from Last 1 Encounters:  09/10/19 (!) 27   Temp Readings from Last 1 Encounters:  09/10/19 98.8 F (37.1 C) (Axillary)    Hemodynamics: CVP:  [4 mmHg-12 mmHg] 6 mmHg  Physical Exam:  Rhythm:   sinus  Breath sounds: clear  Heart sounds:  RRR  Incisions:  Incisional VAC intact  Abdomen:  Soft, non-distended, non-tender, tolerating tube feeds  Extremities:  Warm, well-perfused   Intake/Output from previous day: 05/15 0701 - 05/16 0700 In: 942.9 [I.V.:92.9; NG/GT:550] Out: 1735 [Drains:110; Stool:125] Intake/Output this shift: No intake/output data recorded.  Lab Results:  CBC: Recent Labs    09/09/19 1555 09/10/19 0453  WBC 6.8 5.4  HGB 9.6* 9.5*  HCT 29.6* 29.4*  PLT 182 222    BMET:  Recent Labs    09/09/19 0621 09/10/19 0453  NA 137 138  K 4.2 3.8  CL 102 100  CO2 19* 24  GLUCOSE 168* 121*  BUN 90* 56*  CREATININE 5.92* 4.65*  CALCIUM 8.0* 8.2*     PT/INR:  No results for input(s): LABPROT, INR in the last 72 hours.  CBG (last 3)  Recent Labs    09/10/19 0055 09/10/19 0412 09/10/19 0814  GLUCAP 135* 118* 156*    ABG    Component Value Date/Time   PHART 7.481 (H) 09/07/2019 0029   PCO2ART 31.4 (L) 09/07/2019 0029   PO2ART 125 (H) 09/07/2019 0029   HCO3 23.3  09/07/2019 0029   TCO2 24 09/07/2019 0029   ACIDBASEDEF 5.0 (H) 08/18/2019 2341   O2SAT 99.0 09/07/2019 0029    CXR: clear  Assessment/Plan: S/P Procedure(s) (LRB): MEDIASTINAL EXPLORATION (N/A) STERNAL PLATING (N/A) LAPAROSCOPIC OMENTAL HARVEST (N/A) APPLICATION OF ACELL, APPLICATION OF WOUND VAC USING PREVENA INCISIONAL  DRESSING (N/A) Pectoralis ADVANCEMENT Flap  Clinically stable NSR w/ stable BP on midodrine, no drips, on amiodarone via tube VDRF s/p trach w/ O2 sats 100% on 40% FiO2 - no ongoing attempts to wean Acute systolic CHF - stable ARF - tolerating HD Open chest s/p sternal plating and flap closure Malnutrition, tolerating tube feeds Severe deconditioning Question left sided weakness - repeat head CT negative for acute stroke Anemia stable Afebrile w/ normal WBC currently off ABx   Continue current plan   Rexene Alberts, MD 09/10/2019 9:13 AM

## 2019-09-10 NOTE — Progress Notes (Signed)
ANTICOAGULATION CONSULT NOTE - Follow Up Consult  Pharmacy Consult for heparin Indication:  pulmonary embolus / suspect small stroke   Labs: Recent Labs    09/08/19 0314 09/08/19 1907 09/09/19 0621 09/09/19 0621 09/09/19 1555 09/10/19 0453 09/10/19 1444  HGB 6.5*   < > 9.0*   < > 9.6* 9.5*  --   HCT 21.0*   < > 27.9*  --  29.6* 29.4*  --   PLT 186   < > 207  --  182 222  --   APTT 41*  --  36  --   --  37*  --   HEPARINUNFRC  --   --   --   --   --  <0.10* <0.10*  CREATININE 3.62*  --  5.92*  --   --  4.65*  --    < > = values in this interval not displayed.    Assessment: 74yo male s/p NSTEMI > CABG - complicated post op course.  CT 4/14 small RLL PE has been off heparin since 5/7 for HD cath removal and drop hgb and open chest now closed.   Hemoglobin stable  9, pltc 180s.   Heparin restarted 1000uts/hr HL remains undetectable <goal  Goal of Therapy:  Heparin level 0.3-0.5    Plan:  Increase  heparin drip 1100 uts/hr  Daily HL, CBC  Monitor s/s bleeding    Bonnita Nasuti Pharm.D. CPP, BCPS Clinical Pharmacist [phone] 09/10/2019 7:17 PM

## 2019-09-10 NOTE — Progress Notes (Signed)
Patient ID: Christian [name], male   DOB: 1945/10/19, 74 y.o.   MRN: [id number]   S: Stable over the past 24 hours.  Tolerated dialysis with 1.5 L of ultrafiltration yesterday.  Otherwise no changes.  O:BP (!) 156/58   Pulse 94   Temp 98.8 F (37.1 C) (Axillary)   Resp (!) 27   Ht 5' 9"  (1.753 m)   Wt 84.5 kg   SpO2 100%   BMI 27.51 kg/m   Intake/Output Summary (Last 24 hours) at 09/10/2019 1023 Last data filed at 09/10/2019 0700 Gross per 24 hour  Intake 942.85 ml  Output 1735 ml  Net -792.15 ml   Intake/Output: I/O last 3 completed shifts: In: 2187.9 [I.V.:122.9; Blood:315; Other:300; NG/GT:1150; IV Piggyback:300.1] Out: 2155 [Drains:140; Other:1500; Stool:515]  Intake/Output this shift:  No intake/output data recorded. Weight change: 1.6 kg Gen:  Alert on vent via trach left subclavian HD cath placed 5/7 CVS: Normal rate, open chest wound with wound vac in place Resp: cta, bilateral chest rise Abd: Mild distention, +BS, soft, NT  Ext: 1+ edema  Recent Labs  Lab 09/06/19 0404 09/06/19 0404 09/06/19 1648 09/07/19 0029 09/07/19 0223 09/07/19 0707 09/08/19 0314 09/09/19 0621 09/10/19 0453  NA 138   < > 138 136 136 137 137 137 138  K 4.5   < > 5.1 5.6* 6.2* 5.8* 4.0 4.2 3.8  CL 102  --  105  --  103 104 101 102 100  CO2 26  --  24  --  22 21* 25 19* 24  GLUCOSE 155*  --  127*  --  116* 112* 175* 168* 121*  BUN 28*  --  32*  --  47* 52* 50* 90* 56*  CREATININE 1.92*  --  2.60*  --  3.68* 4.32* 3.62* 5.92* 4.65*  ALBUMIN 2.4*  --  2.3*  --  2.2* 2.2* 2.2* 2.1* 2.0*  CALCIUM 8.5*  --  8.6*  --  8.5* 8.8* 8.1* 8.0* 8.2*  PHOS 2.7  --  4.5  --  5.3* 5.9* 5.3* 7.8* 5.6*  AST  --   --   --   --  72*  --   --   --   --   ALT  --   --   --   --  127*  --   --   --   --    < > = values in this interval not displayed.   Liver Function Tests: Recent Labs  Lab 09/07/19 0223 09/07/19 0707 09/08/19 0314 09/09/19 0621 09/10/19 0453  AST 72*  --   --   --   --   ALT 127*   --   --   --   --   ALKPHOS 193*  --   --   --   --   BILITOT 1.1  --   --   --   --   PROT 5.5*  --   --   --   --   ALBUMIN 2.2*   < > 2.2* 2.1* 2.0*   < > = values in this interval not displayed.   No results for input(s): LIPASE, AMYLASE in the last 168 hours. No results for input(s): AMMONIA in the last 168 hours. CBC: Recent Labs  Lab 09/08/19 1907 09/08/19 1907 09/08/19 2150 09/08/19 2150 09/09/19 0621 09/09/19 1555 09/10/19 0453  WBC 8.6   < > 8.5   < > 6.6 6.8 5.4  HGB 6.7*   < >  6.8*   < > 9.0* 9.6* 9.5*  HCT 21.7*   < > 22.6*   < > 27.9* 29.6* 29.4*  MCV 100.5*  --  101.3*  --  96.2 94.3 94.8  PLT 201   < > 214   < > 207 182 222   < > = values in this interval not displayed.   Cardiac Enzymes: No results for input(s): CKTOTAL, CKMB, CKMBINDEX, TROPONINI in the last 168 hours. CBG: Recent Labs  Lab 09/09/19 1535 09/09/19 2124 09/10/19 0055 09/10/19 0412 09/10/19 0814  GLUCAP 86 112* 135* 118* 156*    Iron Studies: No results for input(s): IRON, TIBC, TRANSFERRIN, FERRITIN in the last 72 hours. Studies/Results: CT HEAD WO CONTRAST  Result Date: 09/09/2019 CLINICAL DATA:  Stroke follow-up EXAM: CT HEAD WITHOUT CONTRAST TECHNIQUE: Contiguous axial images were obtained from the base of the skull through the vertex without intravenous contrast. COMPARISON:  Head CT 09/07/2019 FINDINGS: Brain: There is no mass, hemorrhage or extra-axial collection. There is generalized atrophy without lobar predilection. Hypodensity of the white matter is most commonly associated with chronic microvascular disease. Vascular: No abnormal hyperdensity of the major intracranial arteries or dural venous sinuses. No intracranial atherosclerosis. Skull: The visualized skull base, calvarium and extracranial soft tissues are normal. Sinuses/Orbits: No fluid levels or advanced mucosal thickening of the visualized paranasal sinuses. No mastoid or middle ear effusion. The orbits are normal.  IMPRESSION: Chronic microvascular ischemia and generalized atrophy without acute intracranial abnormality. Electronically Signed   By: Ulyses Jarred M.D.   On: 09/09/2019 04:33   DG Chest Port 1 View  Result Date: 09/10/2019 CLINICAL DATA:  Chest pain. Status post mild cardial infarction. Follow-up exam. EXAM: PORTABLE CHEST 1 VIEW COMPARISON:  09/08/2019 and earlier studies. FINDINGS: Changes from CABG surgery are stable. The cardiac silhouette is mildly enlarged. There is persistent opacity at the left lung base obscuring the hemidiaphragm. Stable nodule in the peripheral right mid lung. Remainder of the lungs is clear. No convincing right pleural effusion. Presumed left pleural effusion. No pneumothorax. Tracheostomy tube, left sided dual lumen central venous catheter and enteric feeding tube are stable. IMPRESSION: 1. No change from the most recent prior study. 2. Persistent left lower lung zone opacity consistent with atelectasis and pleural fluid. 3. Support apparatus is stable. Electronically Signed   By: Lajean Manes M.D.   On: 09/10/2019 09:26   .  stroke: mapping our early stages of recovery book   Does not apply Once  . sodium chloride   Intravenous Once  . sodium chloride   Intravenous Once  . amiodarone  200 mg Per Tube BID  . aspirin  81 mg Per Tube Daily  . atorvastatin  40 mg Per Tube Daily  . B-complex with vitamin C  1 tablet Per Tube Daily  . bisacodyl  10 mg Oral Daily   Or  . bisacodyl  10 mg Rectal Daily  . chlorhexidine gluconate (MEDLINE KIT)  15 mL Mouth Rinse BID  . Chlorhexidine Gluconate Cloth  6 each Topical Q0600  . darbepoetin (ARANESP) injection - NON-DIALYSIS  150 mcg Subcutaneous Q Mon-1800  . docusate  100 mg Per Tube BID  . feeding supplement (PRO-STAT SUGAR FREE 64)  60 mL Per Tube TID  . insulin aspart  0-24 Units Subcutaneous Q4H  . insulin detemir  10 Units Subcutaneous BID  . mouth rinse  15 mL Mouth Rinse 10 times per day  . midodrine  10 mg Oral TID  WC  .  pantoprazole sodium  40 mg Per Tube Daily  . sertraline  25 mg Oral Daily  . sodium chloride flush  10-40 mL Intracatheter Q12H  . sodium chloride flush  10-40 mL Intracatheter Q12H  . trospium  20 mg Per Tube BID    BMET    Component Value Date/Time   NA 138 09/10/2019 0453   NA 141 06/19/2019 1130   K 3.8 09/10/2019 0453   CL 100 09/10/2019 0453   CO2 24 09/10/2019 0453   GLUCOSE 121 (H) 09/10/2019 0453   BUN 56 (H) 09/10/2019 0453   BUN 21 06/19/2019 1130   CREATININE 4.65 (H) 09/10/2019 0453   CALCIUM 8.2 (L) 09/10/2019 0453   GFRNONAA 12 (L) 09/10/2019 0453   GFRAA 14 (L) 09/10/2019 0453   CBC    Component Value Date/Time   WBC 5.4 09/10/2019 0453   RBC 3.10 (L) 09/10/2019 0453   HGB 9.5 (L) 09/10/2019 0453   HCT 29.4 (L) 09/10/2019 0453   PLT 222 09/10/2019 0453   MCV 94.8 09/10/2019 0453   MCH 30.6 09/10/2019 0453   MCHC 32.3 09/10/2019 0453   RDW 20.4 (H) 09/10/2019 0453   LYMPHSABS 1.1 09/02/2019 0311   MONOABS 1.5 (H) 09/02/2019 0311   EOSABS 0.3 09/02/2019 0311   BASOSABS 0.1 09/02/2019 0311     Assessment/Plan:  1. Shock- improved now on meripenem, vanc, and fluconazole.  No pressors 2. AKI- (crt 1.22 on 07/24/19) following cardiac cath, cabg, cardiogenic shock, cardiac arrest due to tamponade, and acute on chronic CHF. Started CRRT on 08/24/19-5/12 due to extensive volume overload presenting chest closure. Now s/p HD x2 and toelrated well.  1. Plan to maintain TTS schedule 2. Adjusting ultrafiltration as needed 2. Left subclavian trialysis catheter placed by PCCM on 5/7 3. No UOP-  Unclear if recovery is possible given major insult 3. 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 Acute systolic heart failure due to ischemic cardiomyopathy- volume markedly improved with CRRT. Will cont UF w/ HD since tolerating and no pressors 5. Postoperative acute hypoxic  respiratory failure- reintubated 08/18/19 and likely aspiration. Now trach 6. Anemia- Hgb stable. transfuse prn.added ESA- darbe 150 7. Ileus-some improvement in distention. Per primary. 8. Elytes-phosphorus is improved with dialysis.  No indication for binder at this time  [place] [phone]

## 2019-09-10 NOTE — Progress Notes (Signed)
RT note: patient placed on CPAP/PSV of 12/5 at 0750.  Currently tolerating well.  Will continue to monitor.

## 2019-09-10 NOTE — Progress Notes (Signed)
Patient ID: Christian [name], male   DOB: [birth year], 74 y.o.   MRN: [id number]     Advanced Heart Failure Rounding Note  PCP-Cardiologist: No primary care provider on file.   Subjective:    08/09/19 Admitted with NSTEM and ? Small RLL PE. Weight 213 pounds.  08/15/19 CABG x4 on 08/15/19 08/18/19 Cardiac arrest due to tamponade. Chest opened for washout 08/20/19 Echo post-pericardial evacuation: EF 40-45% with some residual pericardial clot. 4/26 Chest washout in OR 4/29 CVVH started 5/5 OR for chest washout, tracheostomy 5/7 HD catheter replaced 5/10 OR for washout/dressing change.  5/12 To OR for chest closure with muscle flap  Left-sided weakness seems improved.  Head CT 5/13 and again 5/15 unremarkable. Unable to get brain MRI due to sternal wires.    Hgb stable 9.5 this morning, he is back on heparin gtt.   MAP stable, he is on midodrine 10 tid. CVP 6 after HD yesterday.   Remains on vent through trach. FiO2 30%.   Trach aspirate + rare pseudomonas. He completed course of vancomycin/meropenem/fluconazole.   Objective:   Weight Range: 84.5 kg Body mass index is 27.51 kg/m.   Vital Signs:   Temp:  [98.1 F (36.7 C)-99 F (37.2 C)] 98.8 F (37.1 C) (05/16 0410) Pulse Rate:  [81-95] 94 (05/16 0752) Resp:  [18-27] 27 (05/16 0752) BP: (63-156)/(53-72) 156/58 (05/16 0752) SpO2:  [100 %] 100 % (05/16 0752) FiO2 (%):  [40 %] 40 % (05/16 0752) Weight:  [84.5 kg-88.1 kg] 84.5 kg (05/16 0159) Last BM Date: 09/09/19  Weight change: Filed Weights   09/09/19 0820 09/09/19 1203 09/10/19 0159  Weight: 89.9 kg 88.1 kg 84.5 kg    Intake/Output:   Intake/Output Summary (Last 24 hours) at 09/10/2019 0844 Last data filed at 09/10/2019 0700 Gross per 24 hour  Intake 942.85 ml  Output 1735 ml  Net -792.15 ml      Physical Exam   General: NAD, tracheostomy Neck: No JVD, no thyromegaly or thyroid nodule.  Lungs: Clear to auscultation bilaterally with normal respiratory  effort. CV: Nondisplaced PMI.  Heart regular S1/S2, no S3/S4, no murmur.  No peripheral edema.   Abdomen: Soft, nontender, no hepatosplenomegaly, no distention.  Skin: Intact without lesions or rashes.  Neurologic: Alert and follows commands  Extremities: No clubbing or cyanosis.  HEENT: Normal.    Telemetry   NSR 90s (personally reviewed)  Labs    CBC Recent Labs    09/09/19 1555 09/10/19 0453  WBC 6.8 5.4  HGB 9.6* 9.5*  HCT 29.6* 29.4*  MCV 94.3 94.8  PLT 182 196   Basic Metabolic Panel Recent Labs    09/09/19 0621 09/10/19 0453  NA 137 138  K 4.2 3.8  CL 102 100  CO2 19* 24  GLUCOSE 168* 121*  BUN 90* 56*  CREATININE 5.92* 4.65*  CALCIUM 8.0* 8.2*  MG 2.5* 2.2  PHOS 7.8* 5.6*   Liver Function Tests Recent Labs    09/09/19 0621 09/10/19 0453  ALBUMIN 2.1* 2.0*   No results for input(s): LIPASE, AMYLASE in the last 72 hours. Cardiac Enzymes No results for input(s): CKTOTAL, CKMB, CKMBINDEX, TROPONINI in the last 72 hours.  BNP: BNP (last 3 results) Recent Labs    08/09/19 1240 09/02/19 0311  BNP 89.1 761.0*    ProBNP (last 3 results) Recent Labs    11/23/18 1120 08/09/19 0922  PROBNP 18.0 78.0     D-Dimer No results for input(s): DDIMER in the last 72 hours.  Hemoglobin A1C No results for input(s): HGBA1C in the last 72 hours. Fasting Lipid Panel No results for input(s): CHOL, HDL, LDLCALC, TRIG, CHOLHDL, LDLDIRECT in the last 72 hours. Thyroid Function Tests No results for input(s): TSH, T4TOTAL, T3FREE, THYROIDAB in the last 72 hours.  Invalid input(s): FREET3  Other results:   Imaging    No results found.   Medications:     Scheduled Medications: .  stroke: mapping our early stages of recovery book   Does not apply Once  . sodium chloride   Intravenous Once  . sodium chloride   Intravenous Once  . amiodarone  200 mg Per Tube BID  . aspirin  81 mg Per Tube Daily  . atorvastatin  40 mg Per Tube Daily  . B-complex  with vitamin C  1 tablet Per Tube Daily  . bisacodyl  10 mg Oral Daily   Or  . bisacodyl  10 mg Rectal Daily  . chlorhexidine gluconate (MEDLINE KIT)  15 mL Mouth Rinse BID  . Chlorhexidine Gluconate Cloth  6 each Topical Q0600  . darbepoetin (ARANESP) injection - NON-DIALYSIS  150 mcg Subcutaneous Q Mon-1800  . docusate  100 mg Per Tube BID  . feeding supplement (PRO-STAT SUGAR FREE 64)  60 mL Per Tube TID  . insulin aspart  0-24 Units Subcutaneous Q4H  . insulin detemir  10 Units Subcutaneous BID  . mouth rinse  15 mL Mouth Rinse 10 times per day  . midodrine  10 mg Oral TID WC  . pantoprazole sodium  40 mg Per Tube Daily  . sertraline  25 mg Oral Daily  . sodium chloride flush  10-40 mL Intracatheter Q12H  . sodium chloride flush  10-40 mL Intracatheter Q12H  . trospium  20 mg Per Tube BID    Infusions: . sodium chloride 250 mL (09/07/19 2033)  . sodium chloride    . sodium chloride    . feeding supplement (VITAL 1.5 CAL) 50 mL/hr at 09/10/19 0700  . heparin 1,000 Units/hr (09/10/19 0700)  . lactated ringers 20 mL/hr at 08/20/19 1200    PRN Medications: sodium chloride, sodium chloride, acetaminophen, ALPRAZolam, alteplase, cromolyn, dextrose, heparin, hydrALAZINE, HYDROmorphone (DILAUDID) injection, lidocaine (PF), lidocaine-prilocaine, ondansetron (ZOFRAN) IV, oxyCODONE, pentafluoroprop-tetrafluoroeth, sodium chloride flush, sodium chloride flush     Assessment/Plan   1. CAD: s/p PDA angioplasty + stent in 02/2014.  Admitted for NSTEMI 4/14. Hs Trop peaked at 3,309.  LHC w/ severe 3V CAD. Echo w/ reduced EF 30-35%. RV ok.  S/p CABG x 4 on 4/20 (LIMA-LAD, RIMA to PDA, Lt radial artery graft to 1st OM and distal OM branches of LCx).  Chest reopened urgently at the bedside for cardiac tamponade and removal of clot on 4/23  - Continue ASA. Stopped Plavix as he will need long-term anticoagulation and has anemia.  - Atorvastatin 40 daily.   - S/P muscle flap by plastic surgery   2. Acute Systolic Heart Failure: Ischemic Cardiomyopathy -> cardiogenic shock.  LVEF severely reduced at 30-35%, in the setting of NSTEM/ Multivessel CAD, RV ok initially.  Underwent urgent sternotomy at the bedside for cardiac tamponade and removal of clot on 4/23. Chest remains open. Luiz Blare numbers have been concerning for RV failure (swan now out).  Echo 4/24 EF 40 to 45% with inotropic support, small amount of residual pericardial clot, RV moderately hypokinetic.  CVP 12 today, now getting iHD.   - On midodrine to facilitate HD.  - Will not add additional cardioactive meds with  soft BP with HD.  3.  Postoperative acute hypoxic respiratory failure: Reintubated on 4/23 due to cardiogenic shock and return to OR. Now with tracheostomy, FiO2 30% .  -  Per CCM.  4. AKI: Baseline SCr 0.9. Creatinine peaked to 5.3, suspect ATN in setting of cardiogenic shock/tamponade.  - on iHD. Management per nephrology 5. Shock liver: Due to cardiac arrest on 4/23.  LFTs trending down.  6. 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 Maintaining NSR with PACs.   - Continue amiodarone per tube. - Heparin restarted with stable hgb.   - Eventual transition to Eliquis.  7.  Possible small right lower lobe PE: Chest CT 4/14 w/ small RLL PE.  Bilateral Venous Dopplers negative for DVT.  - Heparin gtt 8. T2DM: Hgb A1c 7.7 - Remains on insulin 9. Pericardial tamponade: 4/23 return to OR for clot removal, chest wall left open. Patient had been on heparin gtt for DCCV of atrial flutter. Returned to the OR 5/10 for washout.   5/12 back to OR for muscle flap and chest closure.  10. ID: Has been on vancomycin/cefepime with open chest. Cefepime switched to meropenem on 5/7 WBCs rising. Concern for infection/developing sepsis.  Blood cultures negative so far. Trach aspirate + rare pseudomonas => Pseudomonas tracheitis versus PNA. HD catheter changed 5/7.  He has completed course of vancomycin/meropenem, now afebrile.     13.  Post-op anemia: Hgb stable. No evidence for overt GI bleeding.  14. Neuro: Left-sided weakness, now improved.  CTs x 2 without definite CVA, cannot get MRI. Neurology has seen.  - With atrial fibrillation history, will need anticoagulation.   Loralie Champagne 09/10/2019 8:44 AM

## 2019-09-10 NOTE — Progress Notes (Signed)
ANTICOAGULATION CONSULT NOTE - Follow Up Consult  Pharmacy Consult for heparin Indication: PE in setting of possible small stroke  Labs: Recent Labs    09/08/19 0314 09/08/19 1907 09/09/19 0621 09/09/19 0621 09/09/19 1555 09/10/19 0453  HGB 6.5*   < > 9.0*   < > 9.6* 9.5*  HCT 21.0*   < > 27.9*  --  29.6* 29.4*  PLT 186   < > 207  --  182 222  APTT 41*  --  36  --   --  37*  HEPARINUNFRC  --   --   --   --   --  <0.10*  CREATININE 3.62*  --  5.92*  --   --  4.65*   < > = values in this interval not displayed.    Assessment: 74yo male subtherapeutic on heparin after resuming at low rate; no gtt issues or signs of bleeding per RN.  Goal of Therapy:  Heparin level 0.3-0.5 units/ml   Plan:  Will increase heparin gtt by 2 units/kg/hr to 1000 units/hr and check level in 8 hours.    Wynona Neat, PharmD, BCPS  09/10/2019,5:55 AM

## 2019-09-10 NOTE — Progress Notes (Signed)
TCTS BRIEF SICU PROGRESS NOTE  4 Days Post-Op  S/P Procedure(s) (LRB): MEDIASTINAL EXPLORATION (N/A) STERNAL PLATING (N/A) LAPAROSCOPIC OMENTAL HARVEST (N/A) APPLICATION OF ACELL, APPLICATION OF WOUND VAC USING PREVENA INCISIONAL  DRESSING (N/A) Pectoralis ADVANCEMENT Flap   Stable day  Plan: Continue current plan  Rexene Alberts, MD 09/10/2019 6:04 PM

## 2019-09-11 DIAGNOSIS — Z9911 Dependence on respirator [ventilator] status: Secondary | ICD-10-CM

## 2019-09-11 DIAGNOSIS — Z930 Tracheostomy status: Secondary | ICD-10-CM

## 2019-09-11 DIAGNOSIS — J9601 Acute respiratory failure with hypoxia: Secondary | ICD-10-CM

## 2019-09-11 LAB — CULTURE, RESPIRATORY W GRAM STAIN
Culture: NORMAL
Gram Stain: NONE SEEN

## 2019-09-11 LAB — RENAL FUNCTION PANEL
Albumin: 2 g/dL — ABNORMAL LOW (ref 3.5–5.0)
Anion gap: 14 (ref 5–15)
BUN: 85 mg/dL — ABNORMAL HIGH (ref 8–23)
CO2: 21 mmol/L — ABNORMAL LOW (ref 22–32)
Calcium: 8.2 mg/dL — ABNORMAL LOW (ref 8.9–10.3)
Chloride: 103 mmol/L (ref 98–111)
Creatinine, Ser: 6.35 mg/dL — ABNORMAL HIGH (ref 0.61–1.24)
GFR calc Af Amer: 9 mL/min — ABNORMAL LOW (ref >60)
GFR calc non Af Amer: 8 mL/min — ABNORMAL LOW (ref >60)
Glucose, Bld: 126 mg/dL — ABNORMAL HIGH (ref 70–99)
Phosphorus: 6.3 mg/dL — ABNORMAL HIGH (ref 2.5–4.6)
Potassium: 3.9 mmol/L (ref 3.5–5.1)
Sodium: 138 mmol/L (ref 135–145)

## 2019-09-11 LAB — GLUCOSE, CAPILLARY
Glucose-Capillary: 145 mg/dL — ABNORMAL HIGH (ref 70–99)
Glucose-Capillary: 150 mg/dL — ABNORMAL HIGH (ref 70–99)
Glucose-Capillary: 152 mg/dL — ABNORMAL HIGH (ref 70–99)
Glucose-Capillary: 164 mg/dL — ABNORMAL HIGH (ref 70–99)
Glucose-Capillary: 197 mg/dL — ABNORMAL HIGH (ref 70–99)
Glucose-Capillary: 201 mg/dL — ABNORMAL HIGH (ref 70–99)

## 2019-09-11 LAB — HEPARIN LEVEL (UNFRACTIONATED)
Heparin Unfractionated: <0.1 IU/mL — ABNORMAL LOW (ref 0.30–0.70)
Heparin Unfractionated: <0.1 IU/mL — ABNORMAL LOW (ref 0.30–0.70)

## 2019-09-11 LAB — MAGNESIUM: Magnesium: 2.3 mg/dL (ref 1.7–2.4)

## 2019-09-11 LAB — APTT: aPTT: 42 seconds — ABNORMAL HIGH (ref 24–36)

## 2019-09-11 MED ORDER — CHLORHEXIDINE GLUCONATE 0.12 % MT SOLN
OROMUCOSAL | Status: AC
  Filled 2019-09-11: qty 15

## 2019-09-11 MED ORDER — ARFORMOTEROL TARTRATE 15 MCG/2ML IN NEBU
15.0000 ug | INHALATION_SOLUTION | Freq: Two times a day (BID) | RESPIRATORY_TRACT | Status: DC
  Administered 2019-09-11 – 2019-10-18 (×67): 15 ug via RESPIRATORY_TRACT
  Filled 2019-09-11 (×71): qty 2

## 2019-09-11 MED ORDER — ALBUTEROL SULFATE (2.5 MG/3ML) 0.083% IN NEBU: 2.5000 mg | INHALATION_SOLUTION | RESPIRATORY_TRACT | Status: DC | PRN

## 2019-09-11 MED ORDER — REVEFENACIN 175 MCG/3ML IN SOLN
175.0000 ug | Freq: Every day | RESPIRATORY_TRACT | Status: DC
  Administered 2019-09-11 – 2019-10-18 (×30): 175 ug via RESPIRATORY_TRACT
  Filled 2019-09-11 (×40): qty 3

## 2019-09-11 NOTE — Progress Notes (Addendum)
NAME:  Christian [name], MRN:  [id number], DOB:  1945/11/23, LOS: 34 ADMISSION DATE:  08/09/2019, CONSULTATION DATE:  08/28/2019 REFERRING MD:  Orvan Seen - TRH, CHIEF COMPLAINT:  Respiratory failure.    HPI/course in hospital   74 year old man who presented with progressive dyspnea.  He was admitted 4/14 and was incidentally found to have a right lower lobe pulmonary embolism.  Overall the picture was that of decompensated heart failure with a non-STEMI.  Coronary angiography revealed triple-vessel disease and the patient was referred for cardiac surgery.  EF was found to be reduced at that time at  He underwent four-vessel CABG 4/20 with good quality distal targets.  Cardiac function remained reduced post bypass.  Unable to close chest due to surrounding edema.  Underwent attempted closure with VAC placement 4/22.   Cardiac arrest 4/23 with repeated washouts with VAC placements on 4/26 and 4/29.  Significant volume overload postoperatively.  Acute kidney injury.  Currently on CRRT for fluid removal.   08/09/19 Admitted with NSTEM and ? Small RLL PE. Weight 213 pounds.  08/15/19 CABG x4 on 08/15/19 08/18/19 Cardiac arrest due to tamponade. Chest opened for washout 4/26 Chest washout in OR 4/29 CVVH started 5/5 OR for chest washout, tracheostomy 5/6 Trach aspirate with pan sensitive pseudomonas (completed course of abx  5/10 repeat washout, unable to close 5/12 to OR for chest closure with muscle flap  5/13 transitioned to [facility] 5/14 Neurology consulted for left sided weakness- CTH neg   4/14 SARS/ Flu A/B >> neg 4/19 MRSA PCR >> neg 4/22 BCx 2 >> neg 5/2 trach asp >> normal flora  5/2 BCx2 >> neg 5/6 trach asp >> pan sensitive pseudomonas  5/6 BCx2 >> neg 5/10 sternal wound cx >> neg 5/14 BCx 2 >> ngtd  5/14 trach asp >> normal flora   Cefepime  4/23 >> 5/6 vanc 4/20 >> 4/25; 4/29 >>5/4; 5/6 >>5/13 Fluconazole 5/7 >>5/14 Meropenem 5/7 >> 5/15  CTA PE 4/14 >> small RLL PE Echo  4/24: EF 45% with Grade 2 DD [facility] 5/13 and 5/15 >> chronic microvascular ischemia, no acute intracranial abnormality   L [place] temporary HD line 5/7 >> R midline PIV 5/6 >> shiley trach 5/5 >> cortrak left nare >> Rectal tube Wound vac over sternum  Interim history/subjective:  No issues.  No complaints per patient Currently weaning on 12/5, 40% Weaned 12 hours yesterday on same Afebrile  CVP 8  Objective   Blood pressure (!) 153/71, pulse 93, temperature 98.2 F (36.8 C), resp. rate (!) 28, height 5\' 9"  (1.753 m), weight 87.2 kg, SpO2 100 %. CVP:  [6 mmHg-77 mmHg] 8 mmHg  Vent Mode: PSV;CPAP FiO2 (%):  [40 %] 40 % Set Rate:  [18 bmp] 18 bmp Vt Set:  [560 mL] 560 mL PEEP:  [5 cmH20] 5 cmH20 Pressure Support:  [12 cmH20] 12 cmH20 Plateau Pressure:  [19 cmH20-21 cmH20] 19 cmH20   Intake/Output Summary (Last 24 hours) at 09/11/2019 0851 Last data filed at 09/11/2019 0700 Gross per 24 hour  Intake 1003.08 ml  Output 610 ml  Net 393.08 ml   Filed Weights   09/09/19 1203 09/10/19 0159 09/11/19 0500  Weight: 88.1 kg 84.5 kg 87.2 kg    Examination: General:  Chronically ill appearing elderly male lying in bed in NAD HEENT: MM pink/moist, pupils 3/reactive, midline trach- some clear secretions around side, cortrak left nare Neuro: Awake, follows commands, left sided weakness  CV: rr, NSR, no murmur, sternal  wound vac in place PULM:  Non labored on 12/5 with TV ~ 400's (his 6cc/kg), scattered rhonchi, clear secretions GI: soft, bs+ Extremities: warm/dry, no LE edema  Skin: no rashes  Assessment & Plan:  Postoperative persistent respiratory failure after CABG and multiple mediastinal washouts, inability to close chest.  S/P trach 5/5.  Chest closed 5/12 - trach asp 5/14 c/w normal flora  - CXR 5/16 stable changes of LLL pleural effusion/ atelectasis  - continue daily PS trials, currently on 12/5, rest at night - goal is ATC but not quite ready yet - VAP bundle - ongoing trach  care per protocol  - progress PT as able  - fluid removal per iHD - PCCM will continue to follow for trach/ MV support   HFrEF, ICM with resolved cardiogenic shock, EF 30-35% Post op aflutter s/p DCCV 4/22 currently in NSR CAD s/p CABG x 4 on 4/20.   Cardiac tamponade on 4/23 s/p open chest for washout and clot removal; chest closure 5/12 with muscle flap per plastic surgery - per cardiology / TCTS - on ASA, atorvastatin - midodrine for bp support  - Continue amiodarone  RLL pulmonary embolism noted mid April, afib - continues on heparin gtt, H/H stable  Left sided weakness - evaluated by neurology, CTH x 2 without definite CVA, unable to have MRI given sternal wires  Deconditioning related to prolonged ICU stay - Progressive mobility as able  Acute tubular necrosis now HD dependent, generalized anasarca, inability to close chest - iHD per Nephrology, goal TTS schedule  (last iHD 5/15) - remains on midodrine 10 mg TID  Best practice:  Diet: TF at goal  Pain/Anxiety/Delirium protocol (if indicated): PRNs xanax/ dilaudid per primary  VAP protocol (if indicated): in place DVT prophylaxis: heparin gtt GI prophylaxis: PPI Glucose control: SSI, levemir Mobility: BR Code Status: full Family Communication: per primary Disposition: ICU    Kennieth Rad, MSN, AGACNP-BC [facility] 09/11/2019, 8:52 AM  See Shea Evans for personal pager PCCM on call pager [phone]   PCCM attending:  74 year old gentleman presented with NSTEMI decompensated heart failure, triple vessel coronary disease referred to cardiothoracic surgery for four-vessel CABG on 08/15/2019.  Following surgery patient suffered cardiac arrest with multiple washouts and VAC placement.  Was fluid overloaded required CVVHD for short period of time.  Pulmonary asked to see again while on continued prolonged mechanical support with tracheostomy tube placement.  BP (!) 135/59   Pulse 84   Temp  98.2 F (36.8 C)   Resp (!) 22   Ht 5\' 9"  (1.753 m)   Wt 87.2 kg   SpO2 100%   BMI 28.39 kg/m   Elderly male resting in bed connected to mechanical support. Tracheostomy tube in place No significant secretions Heart: Regular rhythm S1-S2 Lungs: Bilateral mechanical ventilated breaths Abdomen: Nontender nondistended Extremities: No significant edema.  Labs: Reviewed Mechanical ventilator settings: Reviewed currently in pressure support mechanical support with 12/5.  Tidal volumes of 4 50-4 60.  Assessment: Chronic hypoxemic respiratory failure requiring prolonged mechanical support during hospitalization. Status post CABG, NSTEMI, triple-vessel coronary disease Restrictive chest physiology status post sternotomy, poor chest wall compliance Former smoker Baseline PFTs with mixed restrictive and obstructive defect, FEV1 50% predicted prior to surgery. COPD at baseline  Plan: Nebulized LAMA/LABA, for baseline COPD Holding ICS with tracheostomy to avoid colonization Continue to wean EPAP as tolerated to maintain tidal volumes in mid 400s. We will come back around later today to be more aggressive  at weaning if possible. Continue PT OT, early mobility, out of bed to chair if possible.  We appreciate consultation we will follow along with you.  Garner Nash, DO [facility] 09/11/2019 10:18 AM

## 2019-09-11 NOTE — Progress Notes (Signed)
[facility] Progress Note    Assessment/ Plan:   1. Shock- improved now on meripenem, vanc, and fluconazole.No pressors 2. AKI- (crt 1.22 on 07/24/19) following cardiac cath, cabg, cardiogenic shock, cardiac arrest due to tamponade, and acute on chronic CHF. Started CRRT on 08/24/19-5/12 due to extensive volume overload presenting chest closure. Now s/p HD x2 and toelrated well.  1. Plan to maintain TTS schedule -> next HD tues 2. Adjusting ultrafiltration as needed 2. Left IJ trialysis catheter placed by PCCM on 5/7 3. No UOP-  Unclear if recovery is possible given major insult -> will check a bladder scan today (no foley). 3. 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 Acute systolic heart failure due to ischemic cardiomyopathy- volume markedly improved with CRRT. Will cont UF w/ HD since tolerating and no pressors 5. Postoperative acute hypoxic respiratory failure- reintubated 08/18/19 and likely aspiration. Now trach 6. Anemia- Hgb stable. transfuse prn.added ESA- darbe 150 7. Ileus-some improvement in distention. Per primary. 8. Elytes-phosphorus is improved with dialysis.  No indication for binder at this time  Subjective:   Stable over the past 24 hours.  Tolerated dialysis with 1.5 L of net ultrafiltration Sat.  Otherwise no changes.   Objective:   BP (!) 135/59   Pulse 84   Temp 98.2 F (36.8 C)   Resp (!) 22   Ht 5' 9" (1.753 m)   Wt 87.2 kg   SpO2 100%   BMI 28.39 kg/m   Intake/Output Summary (Last 24 hours) at 09/11/2019 7416 Last data filed at 09/11/2019 0900 Gross per 24 hour  Intake 1077.08 ml  Output 610 ml  Net 467.08 ml   Weight change: -2.7 kg  Physical Exam: Gen:  Alert on vent via trach left IJ HD cath placed 5/7 CVS: Normal rate, open chest wound with wound vac in place Resp: cta, bilateral chest rise Abd: Mild distention, +BS, soft, NT  Ext: 1+  edema LLE GU: no foley Access: LIJ temp (5/7)  Imaging: DG Chest Port 1 View  Result Date: 09/10/2019 CLINICAL DATA:  Chest pain. Status post mild cardial infarction. Follow-up exam. EXAM: PORTABLE CHEST 1 VIEW COMPARISON:  09/08/2019 and earlier studies. FINDINGS: Changes from CABG surgery are stable. The cardiac silhouette is mildly enlarged. There is persistent opacity at the left lung base obscuring the hemidiaphragm. Stable nodule in the peripheral right mid lung. Remainder of the lungs is clear. No convincing right pleural effusion. Presumed left pleural effusion. No pneumothorax. Tracheostomy tube, left sided dual lumen central venous catheter and enteric feeding tube are stable. IMPRESSION: 1. No change from the most recent prior study. 2. Persistent left lower lung zone opacity consistent with atelectasis and pleural fluid. 3. Support apparatus is stable. Electronically Signed   By: Lajean Manes M.D.   On: 09/10/2019 09:26    Labs: BMET Recent Labs  Lab 09/06/19 1648 09/06/19 1648 09/07/19 0029 09/07/19 0223 09/07/19 3845 09/08/19 0314 09/09/19 0621 09/10/19 0453 09/11/19 0208  NA 138   < > 136 136 137 137 137 138 138  K 5.1   < > 5.6* 6.2* 5.8* 4.0 4.2 3.8 3.9  CL 105  --   --  103 104 101 102 100 103  CO2 24  --   --  22 21* 25 19* 24 21*  GLUCOSE 127*  --   --  116* 112* 175* 168* 121* 126*  BUN 32*  --   --  47* 52* 50* 90* 56* 85*  CREATININE 2.60*  --   --  3.68* 4.32* 3.62* 5.92* 4.65* 6.35*  CALCIUM 8.6*  --   --  8.5* 8.8* 8.1* 8.0* 8.2* 8.2*  PHOS 4.5  --   --  5.3* 5.9* 5.3* 7.8* 5.6* 6.3*   < > = values in this interval not displayed.   CBC Recent Labs  Lab 09/08/19 2150 09/09/19 0621 09/09/19 1555 09/10/19 0453  WBC 8.5 6.6 6.8 5.4  HGB 6.8* 9.0* 9.6* 9.5*  HCT 22.6* 27.9* 29.6* 29.4*  MCV 101.3* 96.2 94.3 94.8  PLT 214 207 182 222    Medications:    .  stroke: mapping our early stages of recovery book   Does not apply Once  . sodium chloride    Intravenous Once  . sodium chloride   Intravenous Once  . amiodarone  200 mg Per Tube BID  . aspirin  81 mg Per Tube Daily  . atorvastatin  40 mg Per Tube Daily  . B-complex with vitamin C  1 tablet Per Tube Daily  . bisacodyl  10 mg Oral Daily   Or  . bisacodyl  10 mg Rectal Daily  . chlorhexidine gluconate (MEDLINE KIT)  15 mL Mouth Rinse BID  . Chlorhexidine Gluconate Cloth  6 each Topical Q0600  . darbepoetin (ARANESP) injection - NON-DIALYSIS  150 mcg Subcutaneous Q Mon-1800  . docusate  100 mg Per Tube BID  . feeding supplement (PRO-STAT SUGAR FREE 64)  60 mL Per Tube TID  . insulin aspart  0-24 Units Subcutaneous Q4H  . insulin detemir  10 Units Subcutaneous BID  . mouth rinse  15 mL Mouth Rinse 10 times per day  . midodrine  10 mg Oral TID WC  . pantoprazole sodium  40 mg Per Tube Daily  . sertraline  25 mg Oral Daily  . sodium chloride flush  10-40 mL Intracatheter Q12H  . sodium chloride flush  10-40 mL Intracatheter Q12H  . trospium  20 mg Per Tube BID      Otelia Santee, MD 09/11/2019, 9:39 AM

## 2019-09-11 NOTE — Progress Notes (Signed)
5 Days Post-Op  Subjective:  Alert and awake upon evaluation, nods to questioning. Nods to note improvement in LUE strength/mobility. Denies any significant pain. No fevers.  No acute overnight events. Objective: Vital signs in last 24 hours: Temp:  [98.2 F (36.8 C)-100.1 F (37.8 C)] 98.7 F (37.1 C) (05/17 1128) Pulse Rate:  [84-103] 84 (05/17 0900) Resp:  [18-29] 22 (05/17 0900) BP: (120-158)/(50-80) 135/59 (05/17 0900) SpO2:  [100 %] 100 % (05/17 0900) FiO2 (%):  [0 %-40 %] 40 % (05/17 0822) Weight:  [87.2 kg] 87.2 kg (05/17 0500) Last BM Date: 09/10/19  Intake/Output from previous day: 05/16 0701 - 05/17 0700 In: 1053.1 [I.V.:253.1; NG/GT:800] Out: 23 [Drains:110; Stool:500] Intake/Output this shift: Total I/O In: 124 [I.V.:24; NG/GT:100] Out: -   General appearance: alert, cooperative and ill appearing, tired Eyes: clear conjunctivae Chest wall: TTP midline and along right anterior chest wall, no erythema noted. Some drainage noted from stapled laparoscopic incisions GI: Mild TTP of upper abdomen, JP drain in place, serosanguinous ouput. No erythema noted. Neurologic: Improvement of LUE movement, alert, able to follow commands.  Incision/Wound: Prevena wound vac in place, no drainage in canister noted, no peri-wound erythema noted. No purulence. Some dark drainage noted from laparoscopic incisions inferior to sternal incision noted, no foul odor or crepitus noted.   Lab Results:  CBC Latest Ref Rng & Units 09/10/2019 09/09/2019 09/09/2019  WBC 4.0 - 10.5 K/uL 5.4 6.8 6.6  Hemoglobin 13.0 - 17.0 g/dL 9.5(L) 9.6(L) 9.0(L)  Hematocrit 39.0 - 52.0 % 29.4(L) 29.6(L) 27.9(L)  Platelets 150 - 400 K/uL 222 182 207    BMET Recent Labs    09/10/19 0453 09/11/19 0208  NA 138 138  K 3.8 3.9  CL 100 103  CO2 24 21*  GLUCOSE 121* 126*  BUN 56* 85*  CREATININE 4.65* 6.35*  CALCIUM 8.2* 8.2*   PT/INR No results for input(s): LABPROT, INR in the last 72 hours. ABG No  results for input(s): PHART, HCO3 in the last 72 hours.  Invalid input(s): PCO2, PO2  Studies/Results: DG Chest Port 1 View  Result Date: 09/10/2019 CLINICAL DATA:  Chest pain. Status post mild cardial infarction. Follow-up exam. EXAM: PORTABLE CHEST 1 VIEW COMPARISON:  09/08/2019 and earlier studies. FINDINGS: Changes from CABG surgery are stable. The cardiac silhouette is mildly enlarged. There is persistent opacity at the left lung base obscuring the hemidiaphragm. Stable nodule in the peripheral right mid lung. Remainder of the lungs is clear. No convincing right pleural effusion. Presumed left pleural effusion. No pneumothorax. Tracheostomy tube, left sided dual lumen central venous catheter and enteric feeding tube are stable. IMPRESSION: 1. No change from the most recent prior study. 2. Persistent left lower lung zone opacity consistent with atelectasis and pleural fluid. 3. Support apparatus is stable. Electronically Signed   By: Lajean Manes M.D.   On: 09/10/2019 09:26    Anti-infectives: Anti-infectives (From admission, onward)   Start     Dose/Rate Route Frequency Ordered Stop   09/07/19 2000  vancomycin (VANCOREADY) IVPB 750 mg/150 mL     750 mg 150 mL/hr over 60 Minutes Intravenous  Once 09/07/19 1510 09/07/19 2245   09/06/19 2200  meropenem (MERREM) 500 mg in sodium chloride 0.9 % 100 mL IVPB     500 mg 200 mL/hr over 30 Minutes Intravenous Every 24 hours 09/06/19 1542 09/09/19 0015   09/06/19 2000  fluconazole (DIFLUCAN) IVPB 400 mg     400 mg 100 mL/hr over 120 Minutes Intravenous  Every 24 hours 09/06/19 1542 09/08/19 2240   09/06/19 1800  vancomycin (VANCOREADY) IVPB 750 mg/150 mL     750 mg 150 mL/hr over 60 Minutes Intravenous  Once 09/06/19 1542 09/07/19 0022   09/06/19 1540  vancomycin variable dose per unstable renal function (pharmacist dosing)      Does not apply See admin instructions 09/06/19 1542 09/08/19 2359   09/06/19 1100  ceFAZolin (ANCEF) IVPB 2g/100 mL  premix  Status:  Discontinued     2 g 200 mL/hr over 30 Minutes Intravenous 30 min pre-op 09/05/19 1717 09/06/19 1644   09/06/19 0600  ceFAZolin (ANCEF) IVPB 2g/100 mL premix  Status:  Discontinued     2 g 200 mL/hr over 30 Minutes Intravenous To Short Stay 09/06/19 0546 09/06/19 1644   09/02/19 2200  meropenem (MERREM) 1 g in sodium chloride 0.9 % 100 mL IVPB  Status:  Discontinued     1 g 200 mL/hr over 30 Minutes Intravenous Every 8 hours 09/02/19 1011 09/06/19 1542   09/02/19 2000  fluconazole (DIFLUCAN) IVPB 800 mg  Status:  Discontinued     800 mg 100 mL/hr over 240 Minutes Intravenous Every 24 hours 09/02/19 1011 09/06/19 1542   09/02/19 2000  fluconazole (DIFLUCAN) IVPB 800 mg  Status:  Discontinued     800 mg 200 mL/hr over 120 Minutes Intravenous Every 24 hours 09/02/19 1329 09/02/19 1330   09/02/19 1800  vancomycin (VANCOCIN) IVPB 1000 mg/200 mL premix  Status:  Discontinued     1,000 mg 200 mL/hr over 60 Minutes Intravenous Every 24 hours 09/02/19 1011 09/06/19 1542   09/02/19 0900  meropenem (MERREM) 1 g in sodium chloride 0.9 % 100 mL IVPB  Status:  Discontinued     1 g 200 mL/hr over 30 Minutes Intravenous Every 24 hours 09/01/19 1333 09/02/19 1011   09/01/19 1700  fluconazole (DIFLUCAN) IVPB 800 mg     800 mg 100 mL/hr over 240 Minutes Intravenous  Once 09/01/19 1645 09/01/19 2057   09/01/19 1630  fluconazole (DIFLUCAN) IVPB 400 mg  Status:  Discontinued     400 mg 100 mL/hr over 120 Minutes Intravenous  Once 09/01/19 1629 09/01/19 1645   09/01/19 1330  vancomycin variable dose per unstable renal function (pharmacist dosing)  Status:  Discontinued      Does not apply See admin instructions 09/01/19 1333 09/02/19 1325   09/01/19 0730  meropenem (MERREM) 1 g in sodium chloride 0.9 % 100 mL IVPB  Status:  Discontinued     1 g 200 mL/hr over 30 Minutes Intravenous Every 8 hours 09/01/19 0729 09/01/19 1327   08/31/19 2200  vancomycin (VANCOCIN) IVPB 1000 mg/200 mL premix   Status:  Discontinued     1,000 mg 200 mL/hr over 60 Minutes Intravenous Every 24 hours 08/30/19 2235 09/01/19 1330   08/25/19 0800  vancomycin (VANCOREADY) IVPB 1250 mg/250 mL  Status:  Discontinued     1,250 mg 166.7 mL/hr over 90 Minutes Intravenous Every 24 hours 08/24/19 2121 08/24/19 2128   08/25/19 0100  vancomycin (VANCOREADY) IVPB 1250 mg/250 mL  Status:  Discontinued     1,250 mg 166.7 mL/hr over 90 Minutes Intravenous Every 24 hours 08/24/19 2128 08/30/19 2235   08/24/19 2300  ceFEPIme (MAXIPIME) 2 g in sodium chloride 0.9 % 100 mL IVPB  Status:  Discontinued     2 g 200 mL/hr over 30 Minutes Intravenous Every 12 hours 08/24/19 2150 09/01/19 0729   08/24/19 1338  vancomycin (VANCOCIN) 1,000  mg in sodium chloride 0.9 % 1,000 mL irrigation  Status:  Discontinued       As needed 08/24/19 1338 08/24/19 1508   08/21/19 0000  ceFEPIme (MAXIPIME) 1 g in sodium chloride 0.9 % 100 mL IVPB  Status:  Discontinued     1 g 200 mL/hr over 30 Minutes Intravenous Every 24 hours 08/20/19 0954 08/24/19 2150   08/20/19 1200  vancomycin (VANCOCIN) IVPB 1000 mg/200 mL premix     1,000 mg 200 mL/hr over 60 Minutes Intravenous  Once 08/20/19 0954 08/21/19 0011   08/19/19 2129  vancomycin variable dose per unstable renal function (pharmacist dosing)  Status:  Discontinued      Does not apply See admin instructions 08/19/19 2129 08/24/19 2128   08/19/19 0000  ceFEPIme (MAXIPIME) 2 g in sodium chloride 0.9 % 100 mL IVPB  Status:  Discontinued     2 g 200 mL/hr over 30 Minutes Intravenous Every 24 hours 08/18/19 2351 08/20/19 0954   08/19/19 0000  vancomycin (VANCOREADY) IVPB 2000 mg/400 mL     2,000 mg 200 mL/hr over 120 Minutes Intravenous  Once 08/18/19 2351 08/19/19 0354   08/16/19 1731  vancomycin (VANCOCIN) powder  Status:  Discontinued       As needed 08/16/19 1732 08/16/19 1808   08/15/19 2045  vancomycin (VANCOCIN) IVPB 1000 mg/200 mL premix     1,000 mg 200 mL/hr over 60 Minutes Intravenous   Once 08/15/19 1402 08/15/19 2209   08/15/19 1645  cefUROXime (ZINACEF) 1.5 g in sodium chloride 0.9 % 100 mL IVPB     1.5 g 200 mL/hr over 30 Minutes Intravenous Every 12 hours 08/15/19 1402 08/17/19 0700   08/15/19 0958  vancomycin (VANCOCIN) powder  Status:  Discontinued       As needed 08/15/19 1000 08/15/19 1402   08/15/19 0400  vancomycin (VANCOREADY) IVPB 1500 mg/300 mL     1,500 mg 150 mL/hr over 120 Minutes Intravenous To Surgery 08/14/19 1121 08/15/19 0830   08/15/19 0400  cefUROXime (ZINACEF) 1.5 g in sodium chloride 0.9 % 100 mL IVPB     1.5 g 200 mL/hr over 30 Minutes Intravenous To Surgery 08/14/19 1121 08/15/19 0812   08/15/19 0400  cefUROXime (ZINACEF) 750 mg in sodium chloride 0.9 % 100 mL IVPB     750 mg 200 mL/hr over 30 Minutes Intravenous To Surgery 08/14/19 1121 08/15/19 1300      Assessment/Plan: s/p Procedure(s): MEDIASTINAL EXPLORATION STERNAL PLATING LAPAROSCOPIC OMENTAL HARVEST APPLICATION OF ACELL, APPLICATION OF WOUND VAC USING PREVENA INCISIONAL  DRESSING Pectoralis ADVANCEMENT Flap  Drain in place - daily total of 110 cc, continue to monitor, will remove once output is ~ 25 cc or less per day.   Wound vac in place - plan to remove 09/13/19. No sign of any dehiscence or infection of the surgical site.   ID per primary VTE ppx per primary - heparin per pharmacy   LOS: 23 days    Charlies Constable, PA-C 09/11/2019

## 2019-09-11 NOTE — Progress Notes (Signed)
      [address]       [place] [address]             [phone]      PM rounds  Resting on vent  BP (!) 154/64 (BP Location: Right Leg)   Pulse 86   Temp 98.4 F (36.9 C)   Resp (!) 22   Ht 5\' 9"  (1.753 m)   Wt 87.2 kg   SpO2 100%   BMI 28.39 kg/m   Intake/Output Summary (Last 24 hours) at 09/11/2019 1824 Last data filed at 09/11/2019 1600 Gross per 24 hour  Intake 1560.66 ml  Output 380 ml  Net 1180.66 ml   Continue current plan  Remo Lipps C. Roxan Hockey, MD [facility] [phone]

## 2019-09-11 NOTE — Progress Notes (Addendum)
Patient ID: Christian [name], male   DOB: 05/08/1945, 74 y.o.   MRN: [id number]     Advanced Heart Failure Rounding Note  PCP-Cardiologist: No primary care provider on file.   Subjective:    08/09/19 Admitted with NSTEM and ? Small RLL PE. Weight 213 pounds.  08/15/19 CABG x4 on 08/15/19 08/18/19 Cardiac arrest due to tamponade. Chest opened for washout 08/20/19 Echo post-pericardial evacuation: EF 40-45% with some residual pericardial clot. 4/26 Chest washout in OR 4/29 CVVH started 5/5 OR for chest washout, tracheostomy 5/7 HD catheter replaced 5/10 OR for washout/dressing change.  5/12 To OR for chest closure with muscle flap  Left-sided weakness continues to improve.  Head CT 5/13 and again 5/15 unremarkable. Unable to get brain MRI due to sternal wires.    Remains on vent through trach. FiO2 40%.   Trach aspirate + rare pseudomonas. He completed course of vancomycin/meropenem/fluconazole. Afebrile.   Continues w/ iHD on TTS schedule. CVP 6-7.   No events overnight.   Objective:   Weight Range: 87.2 kg Body mass index is 28.39 kg/m.   Vital Signs:   Temp:  [98.2 F (36.8 C)-100.1 F (37.8 C)] 98.2 F (36.8 C) (05/17 0740) Pulse Rate:  [84-104] 84 (05/17 0900) Resp:  [18-29] 22 (05/17 0900) BP: (120-158)/(50-80) 135/59 (05/17 0900) SpO2:  [100 %] 100 % (05/17 0900) FiO2 (%):  [0 %-40 %] 40 % (05/17 0822) Weight:  [87.2 kg] 87.2 kg (05/17 0500) Last BM Date: 09/10/19  Weight change: Filed Weights   09/09/19 1203 09/10/19 0159 09/11/19 0500  Weight: 88.1 kg 84.5 kg 87.2 kg    Intake/Output:   Intake/Output Summary (Last 24 hours) at 09/11/2019 0958 Last data filed at 09/11/2019 0900 Gross per 24 hour  Intake 1077.08 ml  Output 610 ml  Net 467.08 ml      Physical Exam   CVP 6-7 General: alert, on vent through TC. No distress  Neck: No JVD, no thyromegaly or thyroid nodule.  + TC, + Cor Trak  Lungs: Clear to auscultation bilaterally, no wheezing  CV: +  wound vac over sternotomy site w/ good seal. Nondisplaced PMI.  Heart regular S1/S2, no S3/S4, no murmur.  No peripheral edema.   Abdomen: Soft, nontender, no hepatosplenomegaly, no distention.  Skin: Intact without lesions or rashes.  Neurologic: Alert and follows commands Extremities: No clubbing or cyanosis.  HEENT: Normal.    Telemetry   NSR 80s- 90s (personally reviewed)  Labs    CBC Recent Labs    09/09/19 1555 09/10/19 0453  WBC 6.8 5.4  HGB 9.6* 9.5*  HCT 29.6* 29.4*  MCV 94.3 94.8  PLT 182 366   Basic Metabolic Panel Recent Labs    09/10/19 0453 09/11/19 0208  NA 138 138  K 3.8 3.9  CL 100 103  CO2 24 21*  GLUCOSE 121* 126*  BUN 56* 85*  CREATININE 4.65* 6.35*  CALCIUM 8.2* 8.2*  MG 2.2 2.3  PHOS 5.6* 6.3*   Liver Function Tests Recent Labs    09/10/19 0453 09/11/19 0208  ALBUMIN 2.0* 2.0*   No results for input(s): LIPASE, AMYLASE in the last 72 hours. Cardiac Enzymes No results for input(s): CKTOTAL, CKMB, CKMBINDEX, TROPONINI in the last 72 hours.  BNP: BNP (last 3 results) Recent Labs    08/09/19 1240 09/02/19 0311  BNP 89.1 761.0*    ProBNP (last 3 results) Recent Labs    11/23/18 1120 08/09/19 0922  PROBNP 18.0 78.0  D-Dimer No results for input(s): DDIMER in the last 72 hours. Hemoglobin A1C No results for input(s): HGBA1C in the last 72 hours. Fasting Lipid Panel No results for input(s): CHOL, HDL, LDLCALC, TRIG, CHOLHDL, LDLDIRECT in the last 72 hours. Thyroid Function Tests No results for input(s): TSH, T4TOTAL, T3FREE, THYROIDAB in the last 72 hours.  Invalid input(s): FREET3  Other results:   Imaging    No results found.   Medications:     Scheduled Medications: .  stroke: mapping our early stages of recovery book   Does not apply Once  . sodium chloride   Intravenous Once  . sodium chloride   Intravenous Once  . amiodarone  200 mg Per Tube BID  . aspirin  81 mg Per Tube Daily  . atorvastatin   40 mg Per Tube Daily  . B-complex with vitamin C  1 tablet Per Tube Daily  . bisacodyl  10 mg Oral Daily   Or  . bisacodyl  10 mg Rectal Daily  . chlorhexidine gluconate (MEDLINE KIT)  15 mL Mouth Rinse BID  . Chlorhexidine Gluconate Cloth  6 each Topical Q0600  . darbepoetin (ARANESP) injection - NON-DIALYSIS  150 mcg Subcutaneous Q Mon-1800  . docusate  100 mg Per Tube BID  . feeding supplement (PRO-STAT SUGAR FREE 64)  60 mL Per Tube TID  . insulin aspart  0-24 Units Subcutaneous Q4H  . insulin detemir  10 Units Subcutaneous BID  . mouth rinse  15 mL Mouth Rinse 10 times per day  . midodrine  10 mg Oral TID WC  . pantoprazole sodium  40 mg Per Tube Daily  . sertraline  25 mg Oral Daily  . sodium chloride flush  10-40 mL Intracatheter Q12H  . sodium chloride flush  10-40 mL Intracatheter Q12H  . trospium  20 mg Per Tube BID    Infusions: . sodium chloride 250 mL (09/07/19 2033)  . sodium chloride    . sodium chloride    . feeding supplement (VITAL 1.5 CAL) 50 mL/hr at 09/11/19 0700  . heparin 1,200 Units/hr (09/11/19 0700)  . lactated ringers 20 mL/hr at 08/20/19 1200    PRN Medications: sodium chloride, sodium chloride, acetaminophen, ALPRAZolam, alteplase, cromolyn, dextrose, heparin, hydrALAZINE, HYDROmorphone (DILAUDID) injection, lidocaine (PF), lidocaine-prilocaine, ondansetron (ZOFRAN) IV, oxyCODONE, pentafluoroprop-tetrafluoroeth, sodium chloride flush, sodium chloride flush     Assessment/Plan   1. CAD: s/p PDA angioplasty + stent in 02/2014.  Admitted for NSTEMI 4/14. Hs Trop peaked at 3,309.  LHC w/ severe 3V CAD. Echo w/ reduced EF 30-35%. RV ok.  S/p CABG x 4 on 4/20 (LIMA-LAD, RIMA to PDA, Lt radial artery graft to 1st OM and distal OM branches of LCx).  Chest reopened urgently at the bedside for cardiac tamponade and removal of clot on 4/23  - Continue ASA. Stopped Plavix as he will need long-term anticoagulation and has anemia.  - Atorvastatin 40 daily.   -  S/P muscle flap by plastic surgery  2. Acute Systolic Heart Failure: Ischemic Cardiomyopathy -> cardiogenic shock.  LVEF severely reduced at 30-35%, in the setting of NSTEM/ Multivessel CAD, RV ok initially.  Underwent urgent sternotomy at the bedside for cardiac tamponade and removal of clot on 4/23. Chest remains open. Luiz Blare numbers have been concerning for RV failure (swan now out).  Echo 4/24 EF 40 to 45% with inotropic support, small amount of residual pericardial clot, RV moderately hypokinetic.  CVP 6-7 today. Continue w/ iHD qTTS - On midodrine to facilitate  HD.  - Will not add additional cardioactive meds with soft BP with HD.  3.  Postoperative acute hypoxic respiratory failure: Reintubated on 4/23 due to cardiogenic shock and return to OR. Now with tracheostomy, FiO2 40% .  -  Per CCM.  4. AKI: Baseline SCr 0.9. Creatinine peaked to 5.3, suspect ATN in setting of cardiogenic shock/tamponade.  - on iHD. Management per nephrology 5. Shock liver: Due to cardiac arrest on 4/23.  LFTs trending down.  6. 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 Maintaining NSR with PACs.   - Continue amiodarone per tube. - Heparin restarted with stable hgb.   - Eventual transition to Eliquis.  7.  Possible small right lower lobe PE: Chest CT 4/14 w/ small RLL PE.  Bilateral Venous Dopplers negative for DVT.  - Heparin gtt 8. T2DM: Hgb A1c 7.7 - Remains on insulin 9. Pericardial tamponade: 4/23 return to OR for clot removal, chest wall left open. Patient had been on heparin gtt for DCCV of atrial flutter. Returned to the OR 5/10 for washout.   5/12 back to OR for muscle flap and chest closure.  10. ID: Has been on vancomycin/cefepime with open chest. Cefepime switched to meropenem on 5/7 WBCs rising. Concern for infection/developing sepsis.  Blood cultures negative so far. Trach aspirate + rare pseudomonas => Pseudomonas tracheitis versus PNA. HD catheter changed 5/7.  He has completed course of  vancomycin/meropenem, now afebrile.     13. Post-op anemia: Hgb stable. No evidence for overt GI bleeding.  14. Neuro: Left-sided weakness, now improved.  CTs x 2 without definite CVA, cannot get MRI. Neurology has seen.  - With atrial fibrillation history, will need anticoagulation.   Lyda Jester PA-C 09/11/2019 9:58 AM  Patient seen with PA, agree with the above note.   No significant changes from our standpoint.  He is relatively stable. Remains on midodrine.  To get HD tomorrow.  He is currently on heparin gtt, when stable from surgical standpoint can transition to Eliquis.   Loralie Champagne 09/11/2019 12:29 PM

## 2019-09-11 NOTE — Progress Notes (Signed)
ANTICOAGULATION CONSULT NOTE - Follow Up Consult  Pharmacy Consult for heparin Indication: PE in setting of possible small stroke  Labs: Recent Labs    09/09/19 0621 09/09/19 0621 09/09/19 1555 09/10/19 0453 09/10/19 1444 09/11/19 0208  HGB 9.0*   < > 9.6* 9.5*  --   --   HCT 27.9*  --  29.6* 29.4*  --   --   PLT 207  --  182 222  --   --   APTT 36  --   --  37*  --  42*  HEPARINUNFRC  --   --   --  <0.10* <0.10* <0.10*  CREATININE 5.92*  --   --  4.65*  --   --    < > = values in this interval not displayed.    Assessment: 75yo male remains subtherapeutic on heparin with cautious, slow rate adjustment; no gtt issues or signs of bleeding per RN.  Goal of Therapy:  Heparin level 0.3-0.5 units/ml   Plan:  Will increase heparin gtt slightly to 1200 units/hr and check level in 8 hours.    Wynona Neat, PharmD, BCPS  09/11/2019,3:24 AM

## 2019-09-11 NOTE — Progress Notes (Signed)
RT Note: Pt placed on PSV 12/5 and is tolerating well at this time. RT to continue to monitor.

## 2019-09-11 NOTE — Progress Notes (Signed)
ANTICOAGULATION CONSULT NOTE - Follow Up Consult  Pharmacy Consult for heparin Indication: PE in setting of possible small stroke  Labs: Recent Labs    09/09/19 0621 09/09/19 0621 09/09/19 1555 09/10/19 0453 09/10/19 0453 09/10/19 1444 09/11/19 0208 09/11/19 1127  HGB 9.0*   < > 9.6* 9.5*  --   --   --   --   HCT 27.9*  --  29.6* 29.4*  --   --   --   --   PLT 207  --  182 222  --   --   --   --   APTT 36  --   --  37*  --   --  42*  --   HEPARINUNFRC  --   --   --  <0.10*   < > <0.10* <0.10* <0.10*  CREATININE 5.92*  --   --  4.65*  --   --  6.35*  --    < > = values in this interval not displayed.    Assessment: 74yo male s/p NSTEMI > CABG - complicated post op course.  CT 4/14 small RLL PE has been off heparin since 5/7  and drop hgb and open chest now closed.   Hemoglobin stable  9, pltc 180s.   Heparin drip 1200 uts/hr running through midline and heparin level being drawn from peripheral stick HL < 0.1 - now requiring more heparin than previous careful slow titrations   Goal of Therapy:  Heparin level 0.3-0.5 units/ml   Plan:  Will increase heparin gtt slightly to 1300 units/hr  Daily HL, CBC Monitor s/s bleeding  Bonnita Nasuti Pharm.D. CPP, BCPS Clinical Pharmacist [phone] 09/11/2019 4:54 PM

## 2019-09-12 ENCOUNTER — Inpatient Hospital Stay (HOSPITAL_COMMUNITY): Payer: Medicare Other

## 2019-09-12 LAB — RENAL FUNCTION PANEL
Albumin: 2 g/dL — ABNORMAL LOW (ref 3.5–5.0)
Anion gap: 14 (ref 5–15)
BUN: 116 mg/dL — ABNORMAL HIGH (ref 8–23)
CO2: 23 mmol/L (ref 22–32)
Calcium: 8.5 mg/dL — ABNORMAL LOW (ref 8.9–10.3)
Chloride: 102 mmol/L (ref 98–111)
Creatinine, Ser: 7.83 mg/dL — ABNORMAL HIGH (ref 0.61–1.24)
GFR calc Af Amer: 7 mL/min — ABNORMAL LOW (ref >60)
GFR calc non Af Amer: 6 mL/min — ABNORMAL LOW (ref >60)
Glucose, Bld: 170 mg/dL — ABNORMAL HIGH (ref 70–99)
Phosphorus: 7.5 mg/dL — ABNORMAL HIGH (ref 2.5–4.6)
Potassium: 4.6 mmol/L (ref 3.5–5.1)
Sodium: 139 mmol/L (ref 135–145)

## 2019-09-12 LAB — MAGNESIUM: Magnesium: 2.5 mg/dL — ABNORMAL HIGH (ref 1.7–2.4)

## 2019-09-12 LAB — GLUCOSE, CAPILLARY
Glucose-Capillary: 133 mg/dL — ABNORMAL HIGH (ref 70–99)
Glucose-Capillary: 161 mg/dL — ABNORMAL HIGH (ref 70–99)
Glucose-Capillary: 163 mg/dL — ABNORMAL HIGH (ref 70–99)
Glucose-Capillary: 170 mg/dL — ABNORMAL HIGH (ref 70–99)
Glucose-Capillary: 175 mg/dL — ABNORMAL HIGH (ref 70–99)
Glucose-Capillary: 187 mg/dL — ABNORMAL HIGH (ref 70–99)

## 2019-09-12 LAB — HEPARIN LEVEL (UNFRACTIONATED): Heparin Unfractionated: <0.1 IU/mL — ABNORMAL LOW (ref 0.30–0.70)

## 2019-09-12 LAB — APTT: aPTT: 39 seconds — ABNORMAL HIGH (ref 24–36)

## 2019-09-12 MED ORDER — HEPARIN SODIUM (PORCINE) 1000 UNIT/ML IJ SOLN
INTRAMUSCULAR | Status: AC
  Filled 2019-09-12: qty 4

## 2019-09-12 MED ORDER — "THROMBI-PAD 3""X3"" EX PADS"
1.0000 | MEDICATED_PAD | Freq: Once | CUTANEOUS | Status: AC
  Administered 2019-09-12: 1 via TOPICAL
  Filled 2019-09-12: qty 1

## 2019-09-12 NOTE — Progress Notes (Signed)
ANTICOAGULATION CONSULT NOTE - Follow Up Consult  Pharmacy Consult for heparin Indication: PE in setting of possible small stroke  Labs: Recent Labs    09/09/19 1555 09/10/19 0453 09/10/19 1444 09/11/19 0208 09/11/19 1127 09/12/19 0510  HGB 9.6* 9.5*  --   --   --   --   HCT 29.6* 29.4*  --   --   --   --   PLT 182 222  --   --   --   --   APTT  --  37*  --  42*  --  39*  HEPARINUNFRC  --  <0.10*   < > <0.10* <0.10* <0.10*  CREATININE  --  4.65*  --  6.35*  --  7.83*   < > = values in this interval not displayed.    Assessment: 74yo male s/p NSTEMI > CABG - complicated post op course.  CT 4/14 small RLL PE had been off heparin since 5/7  and drop hgb and open chest now closed.   Hemoglobin stable  9, pltc 180s.  heparin restarted 5/15  Heparin drip 1300 uts/hr running through midline and heparin level being drawn from peripheral stick HL < 0.1 - now requiring more heparin than previous - confirmed line placement and no issues with RN  Per plastics plan vac removal 5/19 ? Change to apixaban after procedures complete  Goal of Therapy:  Heparin level 0.3-0.5 units/ml   Plan:  Will increase heparin drip 1500 units/hr  Daily HL, CBC Monitor s/s bleeding  Bonnita Nasuti Pharm.D. CPP, BCPS Clinical Pharmacist [phone] 09/12/2019 1:57 PM

## 2019-09-12 NOTE — Progress Notes (Signed)
Patient ID: Christian [name], male   DOB: Feb 20, 1946, 74 y.o.   MRN: [id number]  Stable from my standpoint.  When other services agree, can transition from IV heparin gtt to Eliquis 5 mg bid for atrial fibrillation.  Getting HD today.  BP stable.   I will follow from a distance unless called.   Loralie Champagne 09/12/2019

## 2019-09-12 NOTE — Progress Notes (Signed)
Patient ID: Christian [name], male   DOB: [birth year], 74 y.o.   MRN: [id number] TCTS Evening Rounds:  Hemodynamically stable   Weaned on vent today.  Tolerated HD

## 2019-09-12 NOTE — Progress Notes (Addendum)
NAME:  Christian [name], MRN:  [id number], DOB:  07-04-1945, LOS: 77 ADMISSION DATE:  08/09/2019, CONSULTATION DATE:  08/28/2019 REFERRING MD:  Orvan Seen - TRH, CHIEF COMPLAINT:  Respiratory failure.    HPI/course in hospital   74 year old man who presented with progressive dyspnea.  He was admitted 4/14 and was incidentally found to have a right lower lobe pulmonary embolism.  Overall the picture was that of decompensated heart failure with a non-STEMI.  Coronary angiography revealed triple-vessel disease and the patient was referred for cardiac surgery.  EF was found to be reduced at that time at  He underwent four-vessel CABG 4/20 with good quality distal targets.  Cardiac function remained reduced post bypass.  Unable to close chest due to surrounding edema.  Underwent attempted closure with VAC placement 4/22.   Cardiac arrest 4/23 with repeated washouts with VAC placements on 4/26 and 4/29.  Significant volume overload postoperatively.  Acute kidney injury.  Currently on CRRT for fluid removal.   08/09/19 Admitted with NSTEM and ? Small RLL PE. Weight 213 pounds.  08/15/19 CABG x4 on 08/15/19 08/18/19 Cardiac arrest due to tamponade. Chest opened for washout 4/26 Chest washout in OR 4/29 CVVH started 5/5 OR for chest washout, tracheostomy 5/6 Trach aspirate with pan sensitive pseudomonas (completed course of abx  5/10 repeat washout, unable to close 5/12 to OR for chest closure with muscle flap  5/13 transitioned to [facility] 5/14 Neurology consulted for left sided weakness- CTH neg   4/14 SARS/ Flu A/B >> neg 4/19 MRSA PCR >> neg 4/22 BCx 2 >> neg 5/2 trach asp >> normal flora  5/2 BCx2 >> neg 5/6 trach asp >> pan sensitive pseudomonas  5/6 BCx2 >> neg 5/10 sternal wound cx >> neg 5/14 BCx 2 >> ngtd  5/14 trach asp >> normal flora   Cefepime  4/23 >> 5/6 vanc 4/20 >> 4/25; 4/29 >>5/4; 5/6 >>5/13 Fluconazole 5/7 >>5/14 Meropenem 5/7 >> 5/15  CTA PE 4/14 >> small RLL PE Echo  4/24: EF 45% with Grade 2 DD [facility] 5/13 and 5/15 >> chronic microvascular ischemia, no acute intracranial abnormality   L [facility] temporary HD line 5/7 >> R midline PIV 5/6 >> shiley trach 5/5 >> cortrak left nare >> Rectal tube Wound vac over sternum  Interim history/subjective:  Weaned 10/5 for 12 hours 5/17 On iHD this am  Failed am SBT Afebrile  Patient without complaint  Objective   Blood pressure 120/74, pulse 100, temperature 98.8 F (37.1 C), resp. rate 19, height 5\' 9"  (1.753 m), weight 89.4 kg, SpO2 100 %. CVP:  [7 mmHg-11 mmHg] 7 mmHg  Vent Mode: PRVC FiO2 (%):  [40 %] 40 % Set Rate:  [18 bmp] 18 bmp Vt Set:  [560 mL] 560 mL PEEP:  [5 cmH20] 5 cmH20 Pressure Support:  [10 cmH20] 10 cmH20 Plateau Pressure:  [19 cmH20-20 cmH20] 19 cmH20   Intake/Output Summary (Last 24 hours) at 09/12/2019 0908 Last data filed at 09/12/2019 0800 Gross per 24 hour  Intake 1589.97 ml  Output 680 ml  Net 909.97 ml   Filed Weights   09/11/19 0500 09/12/19 0600 09/12/19 0657  Weight: 87.2 kg 89.1 kg 89.4 kg    Examination: General:  Elderly male resting in bed in NAD HEENT: MM pink/moist, midline 8 shiley trach, left nare cortrak Neuro: Awake, follows commands, MAE- ongoing left sided weakness CV: RR, no murmur, sternal wound vac in place PULM:  Non labored, coarse throughout  GI: soft,  bs active  Extremities: warm/dry, no LE edema  Skin: no rashes   Assessment & Plan:   Chronic hypoxemic respiratory failure requiring prolonged mechanical support postoperatively  COPD at baseline, former smoker - Restrictive chest physiology status post sternotomy, poor chest wall compliance - Baseline PFTs with mixed restrictive and obstructive defect, FEV1 50% predicted prior to surgery. - CXR 5/18 stable LLL atelectasis/ slightly better left effusion  Plan:  Continue full MV support with daily SBT trials for goal TVs ~400 Continue LAMA/ LABA, holding ICS with tracheostomy to avoid  colonization Push PT/ OT efforts Trach care per protocol  Intermittent CXR VAP bundle  Fluid removal per iHD PCCM will continue to follow   HFrEF, ICM with resolved cardiogenic shock, EF 30-35% Post op aflutter s/p DCCV 4/22 currently in NSR CAD s/p CABG x 4 on 4/20.   Cardiac tamponade on 4/23 s/p open chest for washout and clot removal; chest closure 5/12 with muscle flap per plastic surgery - per cardiology / TCTS - on ASA, atorvastatin - midodrine for bp support  - Continue amiodarone  RLL pulmonary embolism noted mid April, afib - continues on heparin gtt, H/H stable  Left sided weakness - evaluated by neurology, CTH x 2 without definite CVA, unable to have MRI given sternal wires  Deconditioning related to prolonged ICU stay - Progressive mobility as able  Acute tubular necrosis now HD dependent, generalized anasarca, inability to close chest - iHD per Nephrology, goal TTS schedule  (last iHD 5/15) - remains on midodrine 10 mg TID  Best practice:  Diet: TF at goal  Pain/Anxiety/Delirium protocol (if indicated): PRNs xanax/ dilaudid per primary  VAP protocol (if indicated): in place DVT prophylaxis: heparin gtt GI prophylaxis: PPI Glucose control: SSI, levemir Mobility: BR Code Status: full Family Communication: per primary Disposition: ICU    Kennieth Rad, MSN, AGACNP-BC [facility] 09/12/2019, 9:08 AM  See Shea Evans for personal pager PCCM on call pager [phone]    Pulmonary critical care attending:  74 year old gentleman status post CABG longstanding vent support need ended up in tracheostomy tube placement following four-vessel CABG. At this time slowly weaning from mechanical support.  BP 93/73   Pulse (!) 105   Temp 99.1 F (37.3 C) (Oral)   Resp (!) 22   Ht 5\' 9"  (1.753 m)   Wt 87.4 kg   SpO2 100%   BMI 28.45 kg/m  ' Gen: Elderly male, mechanical support HEENT: Tracheostomy tube in place Heart: Regular rhythm  S1-S2 Lungs: Bilateral mechanical breath sounds  Labs reviewed  Assessment: Chronic hypoxemic respiratory failure requiring tracheostomy to ventilator management. 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  Plan: Continue SBT as tolerated Scheduled COPD meds continue Continue PT OT, progressive mobility.  Garner Nash, DO [facility] 09/12/2019 6:28 PM

## 2019-09-12 NOTE — Progress Notes (Addendum)
ANTICOAGULATION CONSULT NOTE - Follow Up Consult  Pharmacy Consult for heparin Indication: PE in setting of possible small stroke  Labs: Recent Labs    09/10/19 0453 09/10/19 1444 09/11/19 0208 09/11/19 1127 09/12/19 0510  HGB 9.5*  --   --   --   --   HCT 29.4*  --   --   --   --   PLT 222  --   --   --   --   APTT 37*  --  42*  --  39*  HEPARINUNFRC <0.10*   < > <0.10* <0.10* <0.10*  CREATININE 4.65*  --  6.35*  --  7.83*   < > = values in this interval not displayed.    Assessment: 74yo male s/p NSTEMI > CABG - complicated post op course.  CT 4/14 small RLL PE had been off heparin since 5/7  and drop hgb and open chest now closed.    RN reports increased bleeding from HD cath site.  Heparin was increased today and levels have been < 0.1   Goal of Therapy:  Heparin level 0.3-0.5 units/ml   Plan:  Decrease heparin to 1400 units/hr Daily HL, CBC Monitor s/s bleeding  Hildred Laser, PharmD Clinical Pharmacist **Pharmacist phone directory can now be found on [email or web address] ([email or web address]).  Listed under [place].  Addendum -thrombi-pad applied  Plan -Hold heparin for 4 hours then reassess  Hildred Laser, PharmD Clinical Pharmacist **Pharmacist phone directory can now be found on [email or web address] ([email or web address]).  Listed under [place].

## 2019-09-12 NOTE — Progress Notes (Signed)
Occupational Therapy Treatment Patient Details Name: Christian [name] MRN: [id number] DOB: [birth year] Today's Date: 09/12/2019    History of present illness 74 yo admitted with NSTEMI 4/15, RLL PE, 4/20 CABG x 4 remained open with wound VAC with repeated washouts. 4/23 cardiac arrest, 4/ 29 initiated CRRT, vent s/p trach. 5/10 additional sternal washout unable to close.  5/12 sternal plating with pectoralis flap and omental harvest to mediastinum off CRRT with iHD started. Chest wound closed. PMHx: BPH, CAD, DM, HLD, HTN   OT comments  Pt progressing with activity tolerance and OT saw pt slightly after HD ended this PM. Pt egressed bed x15 mins to chair position; sitting balance and total A+2 to slide to [facility]. Pt performing trunk exercises and BUE exercises. Pt scanning for OTR in room several times side to side, but additional testing required as pt presents with lag for visual scanning and inability to turn head to target to R or L without cues. Pt continues to benefit from continued OT. OT following acutely. 40% O2 5 PEEP; VSS; 139/82 (86), HR <120 BPM with exertion. SPouse present.   Follow Up Recommendations  LTACH;Supervision/Assistance - 24 hour    Equipment Recommendations  None recommended by OT    Recommendations for Other Services      Precautions / Restrictions Precautions Precautions: Fall;Sternal Precaution Comments: trach, vent, sternal flap with VAC, jp drain Restrictions Weight Bearing Restrictions: Yes RUE Weight Bearing: Non weight bearing LUE Weight Bearing: Non weight bearing Other Position/Activity Restrictions: sternal precautions       Mobility Bed Mobility Overal bed mobility: Needs Assistance Bed Mobility: Supine to Sit;Sit to Supine     Supine to sit: Total assist Sit to supine: Total assist   General bed mobility comments: Pt egressed bed x15 mins to chair position; sitting balance and total A+2 to slide to [facility].  Transfers                  General transfer comment: pt not yet appropriate to attempt    Balance Overall balance assessment: Needs assistance Sitting-balance support: Bilateral upper extremity supported Sitting balance-Leahy Scale: Zero Sitting balance - Comments: after foot egress in chair position, pt tolerating sitting unsupported with OTR near totalA blocking knees in front of pt and working on trunk support.                                   ADL either performed or assessed with clinical judgement   ADL Overall ADL's : Needs assistance/impaired Eating/Feeding: NPO Eating/Feeding Details (indicate cue type and reason): cortrak Grooming: Maximal assistance;Sitting Grooming Details (indicate cue type and reason): hand over hand assist for RUE then LUE; both requiring maxA today as pt slightly resistive                              Functional mobility during ADLs: Maximal assistance;+2 for physical assistance General ADL Comments: session focused on placing bed in chair position and engaging in UB/trunk exercise. Pt continues to present with L>R sided weakness.     Vision   Vision Assessment?: Vision impaired- to be further tested in functional context Eye Alignment: Within Functional Limits Ocular Range of Motion: Other (comment);Impaired-to be further tested in functional context Additional Comments: Pt scanning for OTR in room several times side to side, but additional testing required as pt presents with lag for  visual scanning and inability to turn head to target to R or L.   Perception     Praxis      Cognition Arousal/Alertness: Awake/alert Behavior During Therapy: WFL for tasks assessed/performed Overall Cognitive Status: Impaired/Different from baseline Area of Impairment: Orientation;Problem solving;Following commands                 Orientation Level: Disoriented to;Time;Situation;Place     Following Commands: Follows one step commands  inconsistently;Follows one step commands with increased time     Problem Solving: Slow processing;Decreased initiation;Requires verbal cues;Requires tactile cues General Comments: Pt requiring direction repeated and increased processing time.        Exercises Exercises: General Upper Extremity;Other exercises General Exercises - Upper Extremity Shoulder Flexion: AAROM;Both;10 reps;Seated Elbow Flexion: AROM;AAROM;Both;10 reps;Seated;Supine Elbow Extension: AAROM;AROM;Both;10 reps;Seated;Supine Wrist Flexion: AROM;Both;10 reps;Supine Wrist Extension: AROM;10 reps;AAROM;Supine Digit Composite Flexion: AROM;Both;10 reps;Seated;Supine Composite Extension: AROM;Both;10 reps;Seated Other Exercises Other Exercises: positioned LUE on bedside table within sternal precautions to engage in table slides and intrinsic hand exercises.   Shoulder Instructions       General Comments 40% O2 5 PEEP; VSS; 139/82 (86), HR <120 BPM with exertion. SPouse present.    Pertinent Vitals/ Pain       Pain Assessment: Faces Faces Pain Scale: Hurts a little bit Pain Location: coretrak sensitive to touch Pain Descriptors / Indicators: Discomfort;Grimacing Pain Intervention(s): Monitored during session;Repositioned  Home Living                                          Prior Functioning/Environment              Frequency  Min 2X/week        Progress Toward Goals  OT Goals(current goals can now be found in the care plan section)  Progress towards OT goals: Progressing toward goals  Acute Rehab OT Goals Patient Stated Goal: return to independence OT Goal Formulation: With patient Time For Goal Achievement: 09/16/19 Potential to Achieve Goals: Fair ADL Goals Pt Will Perform Grooming: with set-up;sitting Pt Will Transfer to Toilet: with mod assist;with +2 assist;squat pivot transfer Pt/caregiver will Perform Home Exercise Program: Increased strength;Increased ROM;Both right  and left upper extremity;With written HEP provided Additional ADL Goal #1: Pt will increase to modA for bed mobility as precursor for OOB ADL and mobility. Additional ADL Goal #2: Pt will complete x7 mins of ADL task with 1 rest break in order to increase activity tolerance. Additional ADL Goal #3: Pt will follow (2) multi step commands with minimal cues using visual perception activities in order to increase awareness of possible visual deficits.  Plan Discharge plan needs to be updated    Co-evaluation                 AM-PAC OT "6 Clicks" Daily Activity     Outcome Measure   Help from another person eating meals?: Total Help from another person taking care of personal grooming?: A Lot Help from another person toileting, which includes using toliet, bedpan, or urinal?: Total Help from another person bathing (including washing, rinsing, drying)?: Total Help from another person to put on and taking off regular upper body clothing?: A Lot Help from another person to put on and taking off regular lower body clothing?: Total 6 Click Score: 8    End of Session Equipment Utilized During Treatment: Oxygen  OT Visit Diagnosis:  Unsteadiness on feet (R26.81);Muscle weakness (generalized) (M62.81);Pain Pain - part of body: ("all over.")   Activity Tolerance Patient tolerated treatment well   Patient Left in bed;with call bell/phone within reach;with bed alarm set;with nursing/sitter in room   Nurse Communication Mobility status        Time: 7949-9718 OT Time Calculation (min): 47 min  Charges: OT General Charges $OT Visit: 1 Visit OT Treatments $Self Care/Home Management : 8-22 mins $Therapeutic Activity: 8-22 mins $Neuromuscular Re-education: 8-22 mins  Jefferey Pica, OTR/L Acute Rehabilitation Services Pager: [phone] Office: [phone]    Johnnye Sandford C 09/12/2019, 4:24 PM

## 2019-09-12 NOTE — Progress Notes (Signed)
Physical Therapy Treatment Patient Details Name: Christian [name] MRN: [id number] DOB: [birth year] Today's Date: 09/12/2019    History of Present Illness 74 yo admitted with NSTEMI 4/15, RLL PE, 4/20 CABG x 4 remained open with wound VAC with repeated washouts. 4/23 cardiac arrest, 4/ 29 initiated CRRT, vent s/p trach. 5/10 additional sternal washout unable to close.  5/12 sternal plating with pectoralis flap and omental harvest to mediastinum off CRRT with iHD started. Chest wound closed. Neurology consulted for left sided weakness- CTH neg 09/08/19. PMHx: BPH, CAD, DM, HLD, HTN    PT Comments    Pt able to tolerate sitting in egress position and complete LE AROM therex with VSS. Completed cervical rotation exercises and stretch with pt to left side. Pt responded best with head nodding and yes/no questions with one step commands. Will continue to follow acutely.   BP 118/84 at rest BP 153/91 seated in egress BP 138/83 post treatment  Follow Up Recommendations  LTACH;Supervision/Assistance - 24 hour     Equipment Recommendations  Other (comment)(TBD)    Recommendations for Other Services       Precautions / Restrictions Precautions Precautions: Fall;Sternal Precaution Comments: trach, vent, sternal flap with VAC, jp drain Restrictions    Mobility  Bed Mobility Overal bed mobility: Needs Assistance Bed Mobility: Supine to Sit;Sit to Supine     Supine to sit: Total assist Sit to supine: Total assist   General bed mobility comments: Pt supine with HOB elevated, leaning to R side upon room entry. Pt egressed bed to chair position with increased leg elevation for exercises.  Transfers                 General transfer comment: pt not yet appropriate to attempt  Ambulation/Gait             General Gait Details: unable   Stairs             Wheelchair Mobility    Modified Rankin (Stroke Patients Only)       Balance Overall balance assessment:  Needs assistance Sitting-balance support: Bilateral upper extremity supported Sitting balance-Leahy Scale: Zero Sitting balance - Comments: after foot egress in chair position, lean to R side Postural control: Right lateral lean                                  Cognition Arousal/Alertness: Awake/alert Behavior During Therapy: WFL for tasks assessed/performed Overall Cognitive Status: Impaired/Different from baseline Area of Impairment: Orientation;Problem solving;Following commands                 Orientation Level: Disoriented to;Time     Following Commands: Follows one step commands consistently;Follows one step commands with increased time;Follows multi-step commands inconsistently     Problem Solving: Slow processing;Decreased initiation;Requires verbal cues;Requires tactile cues General Comments: Pt most responsive with one step commands. Pt required verbal and tactile cues and increased time for mobility and exericises.  Attempted to have pt write down communication, was unable.      Exercises Total Joint Exercises Ankle Circles/Pumps: AROM;Both;5 reps;Seated Long Arc Quad: AROM;Both;5 reps;Seated  Other Exercises Other Exercises: AROM cervical rotation to L side x5 Other Exercises: Cervical rotation stretch to left x 2 min    General Comments General comments (skin integrity, edema, etc.): Pt demonstrated increased tremors with terminal knee extension in chair egress position RLE>LLE. Removed pt socks to observe BLE, skin integrity appear WNL.  Pertinent Vitals/Pain Pain Assessment: Faces Faces Pain Scale: Hurts a little bit Pain Location: LLE when removing sock Pain Descriptors / Indicators: Discomfort;Grimacing Pain Intervention(s): Monitored during session;Limited activity within patient's tolerance;Repositioned    Home Living                      Prior Function            PT Goals (current goals can now be found in the  care plan section) Acute Rehab PT Goals Patient Stated Goal: return to independence PT Goal Formulation: With patient Time For Goal Achievement: 09/26/19 Potential to Achieve Goals: Fair Progress towards PT goals: Not progressing toward goals - comment    Frequency    Min 2X/week      PT Plan Current plan remains appropriate    Co-evaluation              AM-PAC PT "6 Clicks" Mobility   Outcome Measure  Help needed turning from your back to your side while in a flat bed without using bedrails?: Total Help needed moving from lying on your back to sitting on the side of a flat bed without using bedrails?: Total Help needed moving to and from a bed to a chair (including a wheelchair)?: Total Help needed standing up from a chair using your arms (e.g., wheelchair or bedside chair)?: Total Help needed to walk in hospital room?: Total Help needed climbing 3-5 steps with a railing? : Total 6 Click Score: 6    End of Session   Activity Tolerance: Patient tolerated treatment well Patient left: in bed;with call bell/phone within reach;with bed alarm set Nurse Communication: Mobility status PT Visit Diagnosis: Other abnormalities of gait and mobility (R26.89);Muscle weakness (generalized) (M62.81);Other symptoms and signs involving the nervous system (R29.898)     Time: 8206-0156 PT Time Calculation (min) (ACUTE ONLY): 38 min  Charges:  $Therapeutic Exercise: 23-37 mins $Therapeutic Activity: 8-22 mins                     [employer] SPT 09/12/2019    Rolland Porter 09/12/2019, 5:27 PM

## 2019-09-12 NOTE — Progress Notes (Signed)
[facility] Progress Note     Assessment/ Plan:   1. Shock- improved now on meripenem, vanc, and fluconazole.No pressors 2. AKI- (crt 1.22 on 07/24/19) following cardiac cath, cabg, cardiogenic shock, cardiac arrest due to tamponade, and acute on chronic CHF. Started CRRT on 08/24/19-5/12 due to extensive volume overload presenting chest closure. Now s/p HD x2and tolerated well.  1. Plan to maintain TTS schedule -> next HD tues 2. Adjusting ultrafiltration as needed 2. Left IJ trialysis catheter placed by PCCM on 5/7 -> will plan on HD Thursday and then VIR to place tunneeld catheter on Fri.  3. No UOP- Unclear if recovery is possible given major insult ->  bladder scan 5/17 (97m) (no foley). 4. Seen on HD #3 on 5/18 goal UF 2.5L as tolerated but likely will need to decr goal. 2K bath. Still has edema in upper extremities. 3. 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 Acute systolic heart failure due to ischemic cardiomyopathy- volume markedly improved with CRRT. Will cont UF w/ HD since tolerating and no pressors 5. Postoperative acute hypoxic respiratory failure- reintubated 08/18/19 and likely aspiration. Now trach 6. Anemia- Hgbstable. transfuse prn.added ESA- darbe 150 7. Ileus-some improvement in distention. Per primary. 8. Elytes-phosphorus is improved with dialysis. No indication for binder at this time  Subjective:   Stable over the past 24 hours. Tolerated dialysis with 1.5 L of net ultrafiltration Sat. Otherwise no changes.  C/o nausea on dialysis this am.  Objective:   BP 109/68 (BP Location: Right Leg)   Pulse 97   Temp (!) 97.1 F (36.2 C) (Axillary)   Resp 20   Ht _0  (1.753 m)   Wt 89.4 kg   SpO2 100%   BMI 29.11 kg/m   Intake/Output Summary (Last 24 hours) at 09/12/2019 09509Last data filed at 09/12/2019 0600 Gross per 24 hour  Intake 1587.97 ml   Output 680 ml  Net 907.97 ml   Weight change: 1.9 kg  Physical Exam: Gen: Alert on vent via trach left IJ HD cath placed 5/7 CVS: Normal rate, open chest wound with wound vac in place Resp: cta, bilateral chest rise ATOI:ZTIWdistention, +BS, soft, NT  Ext: 1+ edema UE b/l GU: no foley Access: LIJ temp (5/7)  Imaging: No results found.  Labs: BMET Recent Labs  Lab 09/07/19 0223 09/07/19 0707 09/08/19 0314 09/09/19 0621 09/10/19 0453 09/11/19 0208 09/12/19 0510  NA 136 137 137 137 138 138 139  K 6.2* 5.8* 4.0 4.2 3.8 3.9 4.6  CL 103 104 101 102 100 103 102  CO2 22 21* 25 19* 24 21* 23  GLUCOSE 116* 112* 175* 168* 121* 126* 170*  BUN 47* 52* 50* 90* 56* 85* 116*  CREATININE 3.68* 4.32* 3.62* 5.92* 4.65* 6.35* 7.83*  CALCIUM 8.5* 8.8* 8.1* 8.0* 8.2* 8.2* 8.5*  PHOS 5.3* 5.9* 5.3* 7.8* 5.6* 6.3* 7.5*   CBC Recent Labs  Lab 09/08/19 2150 09/09/19 0621 09/09/19 1555 09/10/19 0453  WBC 8.5 6.6 6.8 5.4  HGB 6.8* 9.0* 9.6* 9.5*  HCT 22.6* 27.9* 29.6* 29.4*  MCV 101.3* 96.2 94.3 94.8  PLT 214 207 182 222    Medications:    .  stroke: mapping our early stages of recovery book   Does not apply Once  . sodium chloride   Intravenous Once  . sodium chloride   Intravenous Once  . amiodarone  200  mg Per Tube BID  . arformoterol  15 mcg Nebulization BID  . aspirin  81 mg Per Tube Daily  . atorvastatin  40 mg Per Tube Daily  . B-complex with vitamin C  1 tablet Per Tube Daily  . bisacodyl  10 mg Oral Daily   Or  . bisacodyl  10 mg Rectal Daily  . chlorhexidine      . chlorhexidine gluconate (MEDLINE KIT)  15 mL Mouth Rinse BID  . Chlorhexidine Gluconate Cloth  6 each Topical Q0600  . darbepoetin (ARANESP) injection - NON-DIALYSIS  150 mcg Subcutaneous Q Mon-1800  . docusate  100 mg Per Tube BID  . feeding supplement (PRO-STAT SUGAR FREE 64)  60 mL Per Tube TID  . heparin sodium (porcine)      . insulin aspart  0-24 Units Subcutaneous Q4H  . insulin detemir  10  Units Subcutaneous BID  . mouth rinse  15 mL Mouth Rinse 10 times per day  . midodrine  10 mg Oral TID WC  . pantoprazole sodium  40 mg Per Tube Daily  . revefenacin  175 mcg Nebulization Daily  . sertraline  25 mg Oral Daily  . sodium chloride flush  10-40 mL Intracatheter Q12H  . sodium chloride flush  10-40 mL Intracatheter Q12H  . trospium  20 mg Per Tube BID      Otelia Santee, MD 09/12/2019, 7:52 AM

## 2019-09-13 LAB — CBC WITH DIFFERENTIAL/PLATELET
Abs Immature Granulocytes: 0.05 10*3/uL (ref 0.00–0.07)
Basophils Absolute: 0.1 10*3/uL (ref 0.0–0.1)
Basophils Relative: 1 %
Eosinophils Absolute: 0.3 10*3/uL (ref 0.0–0.5)
Eosinophils Relative: 3 %
HCT: 27.9 % — ABNORMAL LOW (ref 39.0–52.0)
Hemoglobin: 8.7 g/dL — ABNORMAL LOW (ref 13.0–17.0)
Immature Granulocytes: 1 %
Lymphocytes Relative: 13 %
Lymphs Abs: 1 10*3/uL (ref 0.7–4.0)
MCH: 30.3 pg (ref 26.0–34.0)
MCHC: 31.2 g/dL (ref 30.0–36.0)
MCV: 97.2 fL (ref 80.0–100.0)
Monocytes Absolute: 1.1 10*3/uL — ABNORMAL HIGH (ref 0.1–1.0)
Monocytes Relative: 14 %
Neutro Abs: 5.2 10*3/uL (ref 1.7–7.7)
Neutrophils Relative %: 68 %
Platelets: 309 10*3/uL (ref 150–400)
RBC: 2.87 MIL/uL — ABNORMAL LOW (ref 4.22–5.81)
RDW: 19.5 % — ABNORMAL HIGH (ref 11.5–15.5)
WBC: 7.7 10*3/uL (ref 4.0–10.5)
nRBC: 0 % (ref 0.0–0.2)

## 2019-09-13 LAB — RENAL FUNCTION PANEL
Albumin: 2.1 g/dL — ABNORMAL LOW (ref 3.5–5.0)
Anion gap: 16 — ABNORMAL HIGH (ref 5–15)
BUN: 62 mg/dL — ABNORMAL HIGH (ref 8–23)
CO2: 24 mmol/L (ref 22–32)
Calcium: 8.4 mg/dL — ABNORMAL LOW (ref 8.9–10.3)
Chloride: 95 mmol/L — ABNORMAL LOW (ref 98–111)
Creatinine, Ser: 5.16 mg/dL — ABNORMAL HIGH (ref 0.61–1.24)
GFR calc Af Amer: 12 mL/min — ABNORMAL LOW (ref >60)
GFR calc non Af Amer: 10 mL/min — ABNORMAL LOW (ref >60)
Glucose, Bld: 118 mg/dL — ABNORMAL HIGH (ref 70–99)
Phosphorus: 5.5 mg/dL — ABNORMAL HIGH (ref 2.5–4.6)
Potassium: 4.8 mmol/L (ref 3.5–5.1)
Sodium: 135 mmol/L (ref 135–145)

## 2019-09-13 LAB — CULTURE, BLOOD (ROUTINE X 2)
Culture: NO GROWTH
Culture: NO GROWTH
Special Requests: ADEQUATE
Special Requests: ADEQUATE

## 2019-09-13 LAB — GLUCOSE, CAPILLARY
Glucose-Capillary: 131 mg/dL — ABNORMAL HIGH (ref 70–99)
Glucose-Capillary: 124 mg/dL — ABNORMAL HIGH (ref 70–99)
Glucose-Capillary: 145 mg/dL — ABNORMAL HIGH (ref 70–99)
Glucose-Capillary: 144 mg/dL — ABNORMAL HIGH (ref 70–99)
Glucose-Capillary: 179 mg/dL — ABNORMAL HIGH (ref 70–99)

## 2019-09-13 LAB — HEPARIN LEVEL (UNFRACTIONATED): Heparin Unfractionated: 0.91 IU/mL — ABNORMAL HIGH (ref 0.30–0.70)

## 2019-09-13 LAB — MAGNESIUM: Magnesium: 2.1 mg/dL (ref 1.7–2.4)

## 2019-09-13 MED ORDER — HEPARIN (PORCINE) 25000 UT/250ML-% IV SOLN
1100.0000 [IU]/h | INTRAVENOUS | Status: DC
  Administered 2019-09-13: 1200 [IU]/h via INTRAVENOUS
  Filled 2019-09-13 (×2): qty 250

## 2019-09-13 MED ORDER — HEPARIN (PORCINE) 25000 UT/250ML-% IV SOLN: 1400.0000 [IU]/h | INTRAVENOUS | Status: DC

## 2019-09-13 NOTE — Progress Notes (Addendum)
NAME:  Christian [name], MRN:  [id number], DOB:  [birth year], LOS: 57 ADMISSION DATE:  08/09/2019, CONSULTATION DATE:  08/28/2019 REFERRING MD:  Orvan Seen - TRH, CHIEF COMPLAINT:  Respiratory failure.    HPI/course in hospital   74 year old man who presented with progressive dyspnea.  He was admitted 4/14 and was incidentally found to have a right lower lobe pulmonary embolism.  Overall the picture was that of decompensated heart failure with a non-STEMI.  Coronary angiography revealed triple-vessel disease and the patient was referred for cardiac surgery.  EF was found to be reduced at that time.  He underwent four-vessel CABG 4/20 with good quality distal targets.  Cardiac function remained reduced post bypass.  Unable to close chest due to surrounding edema.  Underwent attempted closure with VAC placement 4/22.   Cardiac arrest 4/23 with repeated washouts with VAC placements on 4/26 and 4/29.  Significant volume overload postoperatively.  Acute kidney injury.  Currently on CRRT for fluid removal.   Sig Events: 08/09/19 Admitted with NSTEM and ? Small RLL PE. Weight 213 pounds.  08/15/19 CABG x4 on 08/15/19 08/18/19 Cardiac arrest due to tamponade. Chest opened for washout 4/26 Chest washout in OR 4/29 CVVH started 5/5 OR for chest washout, tracheostomy 5/6 Trach aspirate with pan sensitive pseudomonas (completed course of abx  5/10 repeat washout, unable to close 5/12 to OR for chest closure with muscle flap  5/13 transitioned to [place] 5/14 Neurology consulted for left sided weakness- CTH neg  Micro: 4/14 SARS/ Flu A/B >> neg 4/19 MRSA PCR >> neg 4/22 BCx 2 >> neg 5/2 trach asp >> normal flora  5/2 BCx2 >> neg 5/6 trach asp >> pan sensitive pseudomonas  5/6 BCx2 >> neg 5/10 sternal wound cx >> neg 5/14 BCx 2 >> ngtd  5/14 trach asp >> normal flora   ABX: Cefepime  4/23 >> 5/6 vanc 4/20 >> 4/25; 4/29 >>5/4; 5/6 >>5/13 Fluconazole 5/7 >>5/14 Meropenem 5/7 >> 5/15  Imaging: CTA  PE 4/14 >> small RLL PE Echo 4/24: EF 45% with Grade 2 DD [facility] 5/13 and 5/15 >> chronic microvascular ischemia, no acute intracranial abnormality   LDA: L [place] temporary HD line 5/7 >> R midline PIV 5/6 >> shiley trach 5/5 >> cortrak left nare >> Rectal tube Wound vac over sternum  Interim history/subjective:  No acute events overnight Unable to wean yesterday.  Objective   Blood pressure 132/69, pulse 99, temperature 99.2 F (37.3 C), temperature source Oral, resp. rate 18, height 5\' 9"  (1.753 m), weight 87.2 kg, SpO2 100 %.    Vent Mode: PRVC FiO2 (%):  [40 %] 40 % Set Rate:  [18 bmp] 18 bmp Vt Set:  [560 mL] 560 mL PEEP:  [5 cmH20] 5 cmH20 Pressure Support:  [15 cmH20] 15 cmH20 Plateau Pressure:  [17 cmH20-22 cmH20] 17 cmH20   Intake/Output Summary (Last 24 hours) at 09/13/2019 0739 Last data filed at 09/13/2019 0323 Gross per 24 hour  Intake 1228.37 ml  Output 2440 ml  Net -1211.63 ml   Filed Weights   09/12/19 0657 09/12/19 1112 09/13/19 0500  Weight: 89.4 kg 87.4 kg 87.2 kg    Examination:  General:  Elderly male in bed on vent HEENT: MM pink/moist, #8 shiley trach.  Neuro: Awake, alert, follows commands.  CV: RRR, no MRG PULM:  Bibasilar coarse. Synchronous with vent no distress.  GI: soft, bs active  Extremities: warm/dry, no LE edema  Skin: no rashes   Assessment & Plan:  Chronic hypoxemic respiratory failure requiring prolonged mechanical support postoperatively  COPD at baseline, without acute exacerbation former smoker - Restrictive chest physiology status post sternotomy, poor chest wall compliance - Baseline PFTs with mixed restrictive and obstructive defect, FEV1 50% predicted prior to surgery. - CXR 5/18 stable LLL atelectasis/ slightly better left effusion  Plan: SBT as tolerated.  Continue LAMA/LABA, holding ICS with tracheostomy to avoid colonization PT/OT Trach care per protocol  VAP bundle  Volume removal per iHD  HFrEF, ICM with  resolved cardiogenic shock, EF 30-35% Post op aflutter s/p DCCV 4/22 currently in NSR CAD s/p CABG x 4 on 4/20.   Cardiac tamponade on 4/23 s/p open chest for washout and clot removal; chest closure 5/12 with muscle flap per plastic surgery Plan: - per cardiology / TCTS - on ASA, atorvastatin - midodrine for bp support  - Amiodarone  RLL pulmonary embolism noted mid April, afib - heparin infusion for now  Left sided weakness - evaluated by neurology, CTH x 2 without definite CVA, unable to have MRI given sternal wires  Deconditioning related to prolonged ICU stay - Progressive mobility as able  Acute tubular necrosis now HD dependent, generalized anasarca, inability to close chest - iHD per Nephrology, goal TTS schedule  (last iHD 5/15) - planning for tunneled line placement 5/21 - remains on midodrine 10 mg TID  Best practice:  Diet: TF at goal  Pain/Anxiety/Delirium protocol (if indicated): PRNs xanax/ dilaudid per primary  VAP protocol (if indicated): in place DVT prophylaxis: heparin gtt GI prophylaxis: PPI Glucose control: SSI, levemir Mobility: BR Code Status: full Family Communication: per primary Disposition: ICU   Georgann Housekeeper, AGACNP-BC [place] for personal pager PCCM on call pager [phone]  09/13/2019 7:46 AM   Pulmonary critical care attending:  74 year old gentleman status post CABG prolonged hospital stay requiring intubation mechanical ventilation, now tracheostomy tube dependent.  Slowly weaning from mechanical support.  No acute events overnight.  Patient tolerating pressure support trial this morning.  Vital signs reviewed stable, General: Trach in place on vent Heart: Regular rhythm S1-S2 Lungs: Bilateral mechanically ventilated breath sounds  Labs reviewed  Assessment: Chronic hypoxemic respiratory failure requiring tracheostomy tube dependence.  Plan: Slow vent wean SBT as tolerated Decrease  pressure support as tolerated to maintain tidal volume. Discussed with RT at bedside. Routine trach care  Garner Nash, DO [facility] 09/13/2019 2:35 PM

## 2019-09-13 NOTE — Procedures (Signed)
Tracheostomy Change Note  Patient Details:   Name: Christian [name] DOB: Sep 10, 1945 MRN: [id number]    Airway Documentation:     Evaluation  O2 sats: stable throughout Complications: No apparent complications Patient did tolerate procedure well. Bilateral Breath Sounds: Rhonchi  #8 shiley replaced with #8 shiley, no difficulties, remains on ventilator, weaning at this time.    Revonda Standard 09/13/2019, 11:37 AM

## 2019-09-13 NOTE — Progress Notes (Signed)
ANTICOAGULATION CONSULT NOTE - Follow Up Consult  Pharmacy Consult for heparin Indication: PE in setting of possible small stroke and now with some bleeding  Labs: Recent Labs    09/10/19 0453 09/10/19 1444 09/11/19 0208 09/11/19 1127 09/12/19 0510  HGB 9.5*  --   --   --   --   HCT 29.4*  --   --   --   --   PLT 222  --   --   --   --   APTT 37*  --  42*  --  39*  HEPARINUNFRC <0.10*   < > <0.10* <0.10* <0.10*  CREATININE 4.65*  --  6.35*  --  7.83*   < > = values in this interval not displayed.    Assessment: 74yo male had heparin paused and Thrombi-pad placed for bleeding from HD catheter site; d/w RN who reports that bleeding is now controlled with okay from MD to resume heparin after 4h pause.  Goal of Therapy:  Heparin level 0.3-0.5 units/ml   Plan:  At 0300 will resume heparin gtt at 1400 units/hr and check level in [place], PharmD, BCPS  09/13/2019,2:26 AM

## 2019-09-13 NOTE — Progress Notes (Addendum)
NCM received consult for LTAC. NCM spoke with pt's wife and pt's brother @ bedside regarding potential LTAC disposition. Wife is ok with LTAC disposition. [facility] provided [facility]. Wife stated she's only interested in [place]. Referral made [facility]. [facility] liaison stated no HD beds available and will not be for a while. Also in order for LTAC approval pt cant need Plastics care. Per [facility] to provide care.  TOC will continue to monitor and follow...Marland KitchenMarland Kitchen

## 2019-09-13 NOTE — Progress Notes (Signed)
ANTICOAGULATION CONSULT NOTE - Follow Up Consult  Pharmacy Consult for heparin Indication: PE in setting of possible small stroke and now with some bleeding  Labs: Recent Labs    09/11/19 0208 09/11/19 1127 09/12/19 0510 09/13/19 0531  HGB  --   --   --  8.7*  HCT  --   --   --  27.9*  PLT  --   --   --  309  APTT 42*  --  39*  --   HEPARINUNFRC <0.10* <0.10* <0.10*  --   CREATININE 6.35*  --  7.83* 5.16*    Assessment: 74yo male had heparin paused and Thrombi-pad placed for bleeding from HD catheter site; d/w RN this afternoon and still some slight bleeding around HD site.   Heparin level was above goal (0.9) from peripheral stick opposite side of heparin infusion. Discussed rate change with nurse in room. No cbc for two days prior, appears overall stable this morning.   Goal of Therapy:  Heparin level 0.3-0.5 units/ml   Plan:  Reduce heparin to 1200 units/hr Recheck heparin level tonight  Erin Hearing PharmD., BCPS Clinical Pharmacist 09/13/2019 1:30 PM

## 2019-09-13 NOTE — Progress Notes (Signed)
Dressing with moderate bleeding after patient went into a coughing spell.

## 2019-09-13 NOTE — Progress Notes (Signed)
7 Days Post-Op  Subjective: No acute overnight events, resting in bed with wife at bedside. Denies fevers. Reports some chills.  140 cc of output from JP drain in last 24 hours.  Objective: Vital signs in last 24 hours: Temp:  [98.4 F (36.9 C)-99.6 F (37.6 C)] 99.4 F (37.4 C) (05/19 0738) Pulse Rate:  [98-108] 99 (05/19 0600) Resp:  [18-28] 18 (05/19 0600) BP: (93-150)/(61-113) 132/69 (05/19 0600) SpO2:  [100 %] 100 % (05/19 0755) FiO2 (%):  [40 %] 40 % (05/19 0752) Weight:  [87.2 kg-87.4 kg] 87.2 kg (05/19 0500) Last BM Date: 09/12/19  Intake/Output from previous day: 05/18 0701 - 05/19 0700 In: 1228.4 [I.V.:228.4; NG/GT:1000] Out: 2440 [Drains:140; Stool:300] Intake/Output this shift: No intake/output data recorded.  General appearance: alert, cooperative, no distress. Elderly male, wife at bedside. Nose: NG tube in place Throat: Trach tube in place, on vent. Chest wall: TTP of midline chest and inferiorly near previous tube insertion sites Extremities: Incision of left forearm noted - staples in place, 2+ radial pulses noted. Some swelling of b/l UE noted. Incision/Wound: Wound vac removed from midline chest, incision intact, no dehiscence noted. No peri-wound erythema noted. Nylon sutures in place. JP drain in place along right lower chest wall - serosanguinous fluid - 60 cc in bulb.   Lab Results:  CBC Latest Ref Rng & Units 09/13/2019 09/10/2019 09/09/2019  WBC 4.0 - 10.5 K/uL 7.7 5.4 6.8  Hemoglobin 13.0 - 17.0 g/dL 8.7(L) 9.5(L) 9.6(L)  Hematocrit 39.0 - 52.0 % 27.9(L) 29.4(L) 29.6(L)  Platelets 150 - 400 K/uL 309 222 182    BMET Recent Labs    09/12/19 0510 09/13/19 0531  NA 139 135  K 4.6 4.8  CL 102 95*  CO2 23 24  GLUCOSE 170* 118*  BUN 116* 62*  CREATININE 7.83* 5.16*  CALCIUM 8.5* 8.4*   PT/INR No results for input(s): LABPROT, INR in the last 72 hours. ABG No results for input(s): PHART, HCO3 in the last 72 hours.  Invalid input(s): PCO2,  PO2  Studies/Results: DG Chest Port 1 View  Result Date: 09/12/2019 CLINICAL DATA:  Congestive heart failure. EXAM: PORTABLE CHEST 1 VIEW COMPARISON:  Sep 10, 2019. FINDINGS: Stable cardiomegaly. Tracheostomy and feeding tubes are unchanged in position. Left subclavian catheter is noted with tip in expected position of the SVC. No pneumothorax is noted. Right lung is clear. Mild left basilar atelectasis or infiltrate is noted with associated pleural effusion. Bony thorax is unremarkable. IMPRESSION: Stable support apparatus. Mild left basilar atelectasis or infiltrate is noted with associated pleural effusion. Electronically Signed   By: Marijo Conception M.D.   On: 09/12/2019 09:24    Anti-infectives: Anti-infectives (From admission, onward)   Start     Dose/Rate Route Frequency Ordered Stop   09/07/19 2000  vancomycin (VANCOREADY) IVPB 750 mg/150 mL     750 mg 150 mL/hr over 60 Minutes Intravenous  Once 09/07/19 1510 09/07/19 2245   09/06/19 2200  meropenem (MERREM) 500 mg in sodium chloride 0.9 % 100 mL IVPB     500 mg 200 mL/hr over 30 Minutes Intravenous Every 24 hours 09/06/19 1542 09/09/19 0015   09/06/19 2000  fluconazole (DIFLUCAN) IVPB 400 mg     400 mg 100 mL/hr over 120 Minutes Intravenous Every 24 hours 09/06/19 1542 09/08/19 2240   09/06/19 1800  vancomycin (VANCOREADY) IVPB 750 mg/150 mL     750 mg 150 mL/hr over 60 Minutes Intravenous  Once 09/06/19 1542 09/07/19 0022  09/06/19 1540  vancomycin variable dose per unstable renal function (pharmacist dosing)      Does not apply See admin instructions 09/06/19 1542 09/08/19 2359   09/06/19 1100  ceFAZolin (ANCEF) IVPB 2g/100 mL premix  Status:  Discontinued     2 g 200 mL/hr over 30 Minutes Intravenous 30 min pre-op 09/05/19 1717 09/06/19 1644   09/06/19 0600  ceFAZolin (ANCEF) IVPB 2g/100 mL premix  Status:  Discontinued     2 g 200 mL/hr over 30 Minutes Intravenous To Short Stay 09/06/19 0546 09/06/19 1644   09/02/19 2200   meropenem (MERREM) 1 g in sodium chloride 0.9 % 100 mL IVPB  Status:  Discontinued     1 g 200 mL/hr over 30 Minutes Intravenous Every 8 hours 09/02/19 1011 09/06/19 1542   09/02/19 2000  fluconazole (DIFLUCAN) IVPB 800 mg  Status:  Discontinued     800 mg 100 mL/hr over 240 Minutes Intravenous Every 24 hours 09/02/19 1011 09/06/19 1542   09/02/19 2000  fluconazole (DIFLUCAN) IVPB 800 mg  Status:  Discontinued     800 mg 200 mL/hr over 120 Minutes Intravenous Every 24 hours 09/02/19 1329 09/02/19 1330   09/02/19 1800  vancomycin (VANCOCIN) IVPB 1000 mg/200 mL premix  Status:  Discontinued     1,000 mg 200 mL/hr over 60 Minutes Intravenous Every 24 hours 09/02/19 1011 09/06/19 1542   09/02/19 0900  meropenem (MERREM) 1 g in sodium chloride 0.9 % 100 mL IVPB  Status:  Discontinued     1 g 200 mL/hr over 30 Minutes Intravenous Every 24 hours 09/01/19 1333 09/02/19 1011   09/01/19 1700  fluconazole (DIFLUCAN) IVPB 800 mg     800 mg 100 mL/hr over 240 Minutes Intravenous  Once 09/01/19 1645 09/01/19 2057   09/01/19 1630  fluconazole (DIFLUCAN) IVPB 400 mg  Status:  Discontinued     400 mg 100 mL/hr over 120 Minutes Intravenous  Once 09/01/19 1629 09/01/19 1645   09/01/19 1330  vancomycin variable dose per unstable renal function (pharmacist dosing)  Status:  Discontinued      Does not apply See admin instructions 09/01/19 1333 09/02/19 1325   09/01/19 0730  meropenem (MERREM) 1 g in sodium chloride 0.9 % 100 mL IVPB  Status:  Discontinued     1 g 200 mL/hr over 30 Minutes Intravenous Every 8 hours 09/01/19 0729 09/01/19 1327   08/31/19 2200  vancomycin (VANCOCIN) IVPB 1000 mg/200 mL premix  Status:  Discontinued     1,000 mg 200 mL/hr over 60 Minutes Intravenous Every 24 hours 08/30/19 2235 09/01/19 1330   08/25/19 0800  vancomycin (VANCOREADY) IVPB 1250 mg/250 mL  Status:  Discontinued     1,250 mg 166.7 mL/hr over 90 Minutes Intravenous Every 24 hours 08/24/19 2121 08/24/19 2128    08/25/19 0100  vancomycin (VANCOREADY) IVPB 1250 mg/250 mL  Status:  Discontinued     1,250 mg 166.7 mL/hr over 90 Minutes Intravenous Every 24 hours 08/24/19 2128 08/30/19 2235   08/24/19 2300  ceFEPIme (MAXIPIME) 2 g in sodium chloride 0.9 % 100 mL IVPB  Status:  Discontinued     2 g 200 mL/hr over 30 Minutes Intravenous Every 12 hours 08/24/19 2150 09/01/19 0729   08/24/19 1338  vancomycin (VANCOCIN) 1,000 mg in sodium chloride 0.9 % 1,000 mL irrigation  Status:  Discontinued       As needed 08/24/19 1338 08/24/19 1508   08/21/19 0000  ceFEPIme (MAXIPIME) 1 g in sodium chloride 0.9 %  100 mL IVPB  Status:  Discontinued     1 g 200 mL/hr over 30 Minutes Intravenous Every 24 hours 08/20/19 0954 08/24/19 2150   08/20/19 1200  vancomycin (VANCOCIN) IVPB 1000 mg/200 mL premix     1,000 mg 200 mL/hr over 60 Minutes Intravenous  Once 08/20/19 0954 08/21/19 0011   08/19/19 2129  vancomycin variable dose per unstable renal function (pharmacist dosing)  Status:  Discontinued      Does not apply See admin instructions 08/19/19 2129 08/24/19 2128   08/19/19 0000  ceFEPIme (MAXIPIME) 2 g in sodium chloride 0.9 % 100 mL IVPB  Status:  Discontinued     2 g 200 mL/hr over 30 Minutes Intravenous Every 24 hours 08/18/19 2351 08/20/19 0954   08/19/19 0000  vancomycin (VANCOREADY) IVPB 2000 mg/400 mL     2,000 mg 200 mL/hr over 120 Minutes Intravenous  Once 08/18/19 2351 08/19/19 0354   08/16/19 1731  vancomycin (VANCOCIN) powder  Status:  Discontinued       As needed 08/16/19 1732 08/16/19 1808   08/15/19 2045  vancomycin (VANCOCIN) IVPB 1000 mg/200 mL premix     1,000 mg 200 mL/hr over 60 Minutes Intravenous  Once 08/15/19 1402 08/15/19 2209   08/15/19 1645  cefUROXime (ZINACEF) 1.5 g in sodium chloride 0.9 % 100 mL IVPB     1.5 g 200 mL/hr over 30 Minutes Intravenous Every 12 hours 08/15/19 1402 08/17/19 0700   08/15/19 0958  vancomycin (VANCOCIN) powder  Status:  Discontinued       As needed  08/15/19 1000 08/15/19 1402   08/15/19 0400  vancomycin (VANCOREADY) IVPB 1500 mg/300 mL     1,500 mg 150 mL/hr over 120 Minutes Intravenous To Surgery 08/14/19 1121 08/15/19 0830   08/15/19 0400  cefUROXime (ZINACEF) 1.5 g in sodium chloride 0.9 % 100 mL IVPB     1.5 g 200 mL/hr over 30 Minutes Intravenous To Surgery 08/14/19 1121 08/15/19 0812   08/15/19 0400  cefUROXime (ZINACEF) 750 mg in sodium chloride 0.9 % 100 mL IVPB     750 mg 200 mL/hr over 30 Minutes Intravenous To Surgery 08/14/19 1121 08/15/19 1300      Assessment/Plan: s/p Procedure(s): MEDIASTINAL EXPLORATION STERNAL PLATING LAPAROSCOPIC OMENTAL HARVEST APPLICATION OF ACELL, APPLICATION OF WOUND VAC USING PREVENA INCISIONAL  DRESSING Pectoralis ADVANCEMENT Flap  Prevena vac removed, xeroform, 4x4 gauze, ABD applied. Ordered large mepilex border dressing for nursing staff to apply, remove xeroform when mepilex border dressing arrives. Change every other day pending soil level.   JP drain in place, continue to monitor.   Wife at bedside, discussed plan with her and patient. Pictures were obtained of the patient and placed in the chart with the patient's or guardian's permission.    LOS: 35 days    Charlies Constable, PA-C 09/13/2019

## 2019-09-13 NOTE — Progress Notes (Signed)
[facility] Progress Note     Assessment/ Plan:   1. Shock- improved now on meripenem, vanc, and fluconazole.No pressors 2. AKI- (crt 1.22 on 07/24/19) following cardiac cath, cabg, cardiogenic shock, cardiac arrest due to tamponade, and acute on chronic CHF. Started CRRT on 08/24/19-5/12 due to extensive volume overload presenting chest closure. Now s/p HD x2and tolerated well.  1. Plan to maintain TTS schedule 2. Adjusting ultrafiltration as needed 2. LeftIJtrialysis catheter placed by PCCM on 5/7 -> will plan on HD Thursday and then VIR to place tunneled catheter on Fri.  3. No UOP- Unclear if recovery is possible given major insult->  bladder scan 5/17 (59m) (no foley). 3. 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 Acute systolic heart failure due to ischemic cardiomyopathy- volume markedly improved with CRRT. Will cont UF w/ HD since tolerating and no pressors 5. Postoperative acute hypoxic respiratory failure- reintubated 08/18/19 and likely aspiration. Now trach 6. Anemia- Hgbstable. transfuse prn.added ESA- darbe 150 7. Ileus-some improvement in distention. Per primary. 8. Elytes-phosphorus is improved with dialysis. No indication for binder at this time  Subjective:   Tired but not c/o pain or nausea. No events overnight. No f/c/dyspnea. Tolerated HD yest    Objective:   BP 132/69   Pulse 99   Temp 99.4 F (37.4 C) (Axillary)   Resp 18   Ht 5' 9"  (1.753 m)   Wt 87.2 kg   SpO2 100%   BMI 28.39 kg/m   Intake/Output Summary (Last 24 hours) at 09/13/2019 1100 Last data filed at 09/13/2019 01610Gross per 24 hour  Intake 1039.29 ml  Output 2440 ml  Net -1400.71 ml   Weight change: -1.7 kg  Physical Exam: Gen: Alert on vent via trach leftIJHD cath placed 5/7 CVS: Normal rate, open chest wound with wound vac in place Resp: cta, bilateral chest  rise ARUE:AVWUdistention, +BS, soft, NT  Ext: 1+ edemaUE b/l GU: no foley Access: LIJ temp (5/7)  Imaging: DG Chest Port 1 View  Result Date: 09/12/2019 CLINICAL DATA:  Congestive heart failure. EXAM: PORTABLE CHEST 1 VIEW COMPARISON:  Sep 10, 2019. FINDINGS: Stable cardiomegaly. Tracheostomy and feeding tubes are unchanged in position. Left subclavian catheter is noted with tip in expected position of the SVC. No pneumothorax is noted. Right lung is clear. Mild left basilar atelectasis or infiltrate is noted with associated pleural effusion. Bony thorax is unremarkable. IMPRESSION: Stable support apparatus. Mild left basilar atelectasis or infiltrate is noted with associated pleural effusion. Electronically Signed   By: JMarijo ConceptionM.D.   On: 09/12/2019 09:24    Labs: BMET Recent Labs  Lab 09/07/19 0707 09/08/19 0314 09/09/19 0621 09/10/19 0453 09/11/19 0208 09/12/19 0510 09/13/19 0531  NA 137 137 137 138 138 139 135  K 5.8* 4.0 4.2 3.8 3.9 4.6 4.8  CL 104 101 102 100 103 102 95*  CO2 21* 25 19* 24 21* 23 24  GLUCOSE 112* 175* 168* 121* 126* 170* 118*  BUN 52* 50* 90* 56* 85* 116* 62*  CREATININE 4.32* 3.62* 5.92* 4.65* 6.35* 7.83* 5.16*  CALCIUM 8.8* 8.1* 8.0* 8.2* 8.2* 8.5* 8.4*  PHOS 5.9* 5.3* 7.8* 5.6* 6.3* 7.5* 5.5*   CBC Recent Labs  Lab 09/09/19 0621 09/09/19 1555 09/10/19 0453 09/13/19 0531  WBC 6.6 6.8 5.4 7.7  NEUTROABS  --   --   --  5.2  HGB 9.0*  9.6* 9.5* 8.7*  HCT 27.9* 29.6* 29.4* 27.9*  MCV 96.2 94.3 94.8 97.2  PLT 207 182 222 309    Medications:    .  stroke: mapping our early stages of recovery book   Does not apply Once  . sodium chloride   Intravenous Once  . sodium chloride   Intravenous Once  . amiodarone  200 mg Per Tube BID  . arformoterol  15 mcg Nebulization BID  . aspirin  81 mg Per Tube Daily  . atorvastatin  40 mg Per Tube Daily  . B-complex with vitamin C  1 tablet Per Tube Daily  . bisacodyl  10 mg Oral Daily   Or  .  bisacodyl  10 mg Rectal Daily  . chlorhexidine gluconate (MEDLINE KIT)  15 mL Mouth Rinse BID  . Chlorhexidine Gluconate Cloth  6 each Topical Q0600  . darbepoetin (ARANESP) injection - NON-DIALYSIS  150 mcg Subcutaneous Q Mon-1800  . docusate  100 mg Per Tube BID  . feeding supplement (PRO-STAT SUGAR FREE 64)  60 mL Per Tube TID  . insulin aspart  0-24 Units Subcutaneous Q4H  . insulin detemir  10 Units Subcutaneous BID  . mouth rinse  15 mL Mouth Rinse 10 times per day  . midodrine  10 mg Oral TID WC  . pantoprazole sodium  40 mg Per Tube Daily  . revefenacin  175 mcg Nebulization Daily  . sertraline  25 mg Oral Daily  . sodium chloride flush  10-40 mL Intracatheter Q12H  . sodium chloride flush  10-40 mL Intracatheter Q12H  . trospium  20 mg Per Tube BID      Otelia Santee, MD 09/13/2019, 11:00 AM

## 2019-09-13 NOTE — Progress Notes (Signed)
Md informed of hemodialysis cath site which has been bleeding and requiring frequent dressing changes now with moderate bleeding through the dressing and into bed. Hemostasis after pressure was held for 15 minutes  and thrombi pad applied. Heparin to be held for 4hrs

## 2019-09-14 ENCOUNTER — Inpatient Hospital Stay (HOSPITAL_COMMUNITY): Payer: Medicare Other | Admitting: Certified Registered Nurse Anesthetist

## 2019-09-14 ENCOUNTER — Inpatient Hospital Stay (HOSPITAL_COMMUNITY): Payer: Medicare Other

## 2019-09-14 ENCOUNTER — Other Ambulatory Visit (HOSPITAL_COMMUNITY): Payer: Medicare Other

## 2019-09-14 ENCOUNTER — Encounter (HOSPITAL_COMMUNITY)
Admission: EM | Disposition: A | Discharge status: Skilled Nursing Facility | Payer: Self-pay | Source: Home / Self Care | Attending: Cardiothoracic Surgery

## 2019-09-14 DIAGNOSIS — I97638 Postprocedural hematoma of a circulatory system organ or structure following other circulatory system procedure: Secondary | ICD-10-CM

## 2019-09-14 HISTORY — PX: EXPLORATION POST OPERATIVE OPEN HEART: SHX5061

## 2019-09-14 LAB — GLUCOSE, CAPILLARY
Glucose-Capillary: 212 mg/dL — ABNORMAL HIGH (ref 70–99)
Glucose-Capillary: 215 mg/dL — ABNORMAL HIGH (ref 70–99)
Glucose-Capillary: 221 mg/dL — ABNORMAL HIGH (ref 70–99)
Glucose-Capillary: 222 mg/dL — ABNORMAL HIGH (ref 70–99)
Glucose-Capillary: 226 mg/dL — ABNORMAL HIGH (ref 70–99)
Glucose-Capillary: 244 mg/dL — ABNORMAL HIGH (ref 70–99)
Glucose-Capillary: 245 mg/dL — ABNORMAL HIGH (ref 70–99)

## 2019-09-14 LAB — BASIC METABOLIC PANEL
Anion gap: 14 (ref 5–15)
BUN: 97 mg/dL — ABNORMAL HIGH (ref 8–23)
CO2: 22 mmol/L (ref 22–32)
Calcium: 7.8 mg/dL — ABNORMAL LOW (ref 8.9–10.3)
Chloride: 97 mmol/L — ABNORMAL LOW (ref 98–111)
Creatinine, Ser: 6.78 mg/dL — ABNORMAL HIGH (ref 0.61–1.24)
GFR calc Af Amer: 9 mL/min — ABNORMAL LOW (ref >60)
GFR calc non Af Amer: 7 mL/min — ABNORMAL LOW (ref >60)
Glucose, Bld: 265 mg/dL — ABNORMAL HIGH (ref 70–99)
Potassium: 5.6 mmol/L — ABNORMAL HIGH (ref 3.5–5.1)
Sodium: 133 mmol/L — ABNORMAL LOW (ref 135–145)

## 2019-09-14 LAB — RENAL FUNCTION PANEL
Albumin: 2.1 g/dL — ABNORMAL LOW (ref 3.5–5.0)
Anion gap: 17 — ABNORMAL HIGH (ref 5–15)
BUN: 96 mg/dL — ABNORMAL HIGH (ref 8–23)
CO2: 20 mmol/L — ABNORMAL LOW (ref 22–32)
Calcium: 8.3 mg/dL — ABNORMAL LOW (ref 8.9–10.3)
Chloride: 96 mmol/L — ABNORMAL LOW (ref 98–111)
Creatinine, Ser: 6.83 mg/dL — ABNORMAL HIGH (ref 0.61–1.24)
GFR calc Af Amer: 9 mL/min — ABNORMAL LOW (ref >60)
GFR calc non Af Amer: 7 mL/min — ABNORMAL LOW (ref >60)
Glucose, Bld: 196 mg/dL — ABNORMAL HIGH (ref 70–99)
Phosphorus: 7.5 mg/dL — ABNORMAL HIGH (ref 2.5–4.6)
Potassium: 5.4 mmol/L — ABNORMAL HIGH (ref 3.5–5.1)
Sodium: 133 mmol/L — ABNORMAL LOW (ref 135–145)

## 2019-09-14 LAB — CBC WITH DIFFERENTIAL/PLATELET
Abs Immature Granulocytes: 0.07 10*3/uL (ref 0.00–0.07)
Abs Immature Granulocytes: 0.16 10*3/uL — ABNORMAL HIGH (ref 0.00–0.07)
Basophils Absolute: 0.1 10*3/uL (ref 0.0–0.1)
Basophils Absolute: 0.1 10*3/uL (ref 0.0–0.1)
Basophils Relative: 1 %
Basophils Relative: 1 %
Eosinophils Absolute: 0.2 10*3/uL (ref 0.0–0.5)
Eosinophils Absolute: 0.1 10*3/uL (ref 0.0–0.5)
Eosinophils Relative: 2 %
Eosinophils Relative: 1 %
HCT: 22.8 % — ABNORMAL LOW (ref 39.0–52.0)
HCT: 20.6 % — ABNORMAL LOW (ref 39.0–52.0)
Hemoglobin: 7.2 g/dL — ABNORMAL LOW (ref 13.0–17.0)
Hemoglobin: 6.1 g/dL — CL (ref 13.0–17.0)
Immature Granulocytes: 1 %
Immature Granulocytes: 1 %
Lymphocytes Relative: 16 %
Lymphocytes Relative: 22 %
Lymphs Abs: 1.4 10*3/uL (ref 0.7–4.0)
Lymphs Abs: 2.9 10*3/uL (ref 0.7–4.0)
MCH: 30.9 pg (ref 26.0–34.0)
MCH: 30 pg (ref 26.0–34.0)
MCHC: 31.6 g/dL (ref 30.0–36.0)
MCHC: 29.6 g/dL — ABNORMAL LOW (ref 30.0–36.0)
MCV: 97.9 fL (ref 80.0–100.0)
MCV: 101.5 fL — ABNORMAL HIGH (ref 80.0–100.0)
Monocytes Absolute: 1.2 10*3/uL — ABNORMAL HIGH (ref 0.1–1.0)
Monocytes Absolute: 1.2 10*3/uL — ABNORMAL HIGH (ref 0.1–1.0)
Monocytes Relative: 14 %
Monocytes Relative: 9 %
Neutro Abs: 5.8 10*3/uL (ref 1.7–7.7)
Neutro Abs: 8.9 10*3/uL — ABNORMAL HIGH (ref 1.7–7.7)
Neutrophils Relative %: 66 %
Neutrophils Relative %: 66 %
Platelets: 362 10*3/uL (ref 150–400)
Platelets: 393 10*3/uL (ref 150–400)
RBC: 2.33 MIL/uL — ABNORMAL LOW (ref 4.22–5.81)
RBC: 2.03 MIL/uL — ABNORMAL LOW (ref 4.22–5.81)
RDW: 19.4 % — ABNORMAL HIGH (ref 11.5–15.5)
RDW: 19.2 % — ABNORMAL HIGH (ref 11.5–15.5)
WBC: 8.8 10*3/uL (ref 4.0–10.5)
WBC: 13.4 10*3/uL — ABNORMAL HIGH (ref 4.0–10.5)
nRBC: 0 % (ref 0.0–0.2)
nRBC: 0 % (ref 0.0–0.2)

## 2019-09-14 LAB — PREPARE RBC (CROSSMATCH)

## 2019-09-14 LAB — CBC
HCT: 32.4 % — ABNORMAL LOW (ref 39.0–52.0)
Hemoglobin: 10.6 g/dL — ABNORMAL LOW (ref 13.0–17.0)
MCH: 30.9 pg (ref 26.0–34.0)
MCHC: 32.7 g/dL (ref 30.0–36.0)
MCV: 94.5 fL (ref 80.0–100.0)
Platelets: 282 10*3/uL (ref 150–400)
RBC: 3.43 MIL/uL — ABNORMAL LOW (ref 4.22–5.81)
RDW: 17.2 % — ABNORMAL HIGH (ref 11.5–15.5)
WBC: 11.9 10*3/uL — ABNORMAL HIGH (ref 4.0–10.5)
nRBC: 0 % (ref 0.0–0.2)

## 2019-09-14 LAB — HEPARIN LEVEL (UNFRACTIONATED): Heparin Unfractionated: 0.57 IU/mL (ref 0.30–0.70)

## 2019-09-14 LAB — PROTIME-INR
INR: 1.2 (ref 0.8–1.2)
Prothrombin Time: 15 seconds (ref 11.4–15.2)

## 2019-09-14 LAB — MAGNESIUM: Magnesium: 2.3 mg/dL (ref 1.7–2.4)

## 2019-09-14 SURGERY — EXPLORATION POST OPERATIVE OPEN HEART
Anesthesia: General

## 2019-09-14 MED ORDER — HEMOSTATIC AGENTS (NO CHARGE) OPTIME
TOPICAL | Status: DC | PRN
  Administered 2019-09-14: 1 via TOPICAL

## 2019-09-14 MED ORDER — FENTANYL CITRATE (PF) 100 MCG/2ML IJ SOLN
INTRAMUSCULAR | Status: DC | PRN
  Administered 2019-09-14: 50 ug via INTRAVENOUS

## 2019-09-14 MED ORDER — HEPARIN SODIUM (PORCINE) 1000 UNIT/ML IJ SOLN
INTRAMUSCULAR | Status: AC
  Administered 2019-09-15: 3000 [IU] via INTRAVENOUS_CENTRAL
  Filled 2019-09-14: qty 4

## 2019-09-14 MED ORDER — MIDAZOLAM HCL 5 MG/5ML IJ SOLN
INTRAMUSCULAR | Status: DC | PRN
  Administered 2019-09-14 (×2): 1 mg via INTRAVENOUS

## 2019-09-14 MED ORDER — SUGAMMADEX SODIUM 200 MG/2ML IV SOLN
INTRAVENOUS | Status: DC | PRN
  Administered 2019-09-14: 200 mg via INTRAVENOUS

## 2019-09-14 MED ORDER — RENA-VITE PO TABS
1.0000 | ORAL_TABLET | Freq: Every day | ORAL | Status: DC
  Administered 2019-09-14 – 2019-10-05 (×18): 1 via ORAL
  Filled 2019-09-14 (×19): qty 1

## 2019-09-14 MED ORDER — SODIUM POLYSTYRENE SULFONATE 15 GM/60ML PO SUSP
30.0000 g | Freq: Once | ORAL | Status: AC
  Administered 2019-09-14: 30 g via ORAL
  Filled 2019-09-14: qty 120

## 2019-09-14 MED ORDER — SEVELAMER CARBONATE 0.8 G PO PACK
0.8000 g | PACK | Freq: Three times a day (TID) | ORAL | Status: DC
  Administered 2019-09-14 – 2019-09-15 (×3): 0.8 g via ORAL
  Filled 2019-09-14 (×4): qty 1

## 2019-09-14 MED ORDER — ROCURONIUM BROMIDE 10 MG/ML (PF) SYRINGE
PREFILLED_SYRINGE | INTRAVENOUS | Status: AC
  Filled 2019-09-14: qty 10

## 2019-09-14 MED ORDER — FENTANYL CITRATE (PF) 100 MCG/2ML IJ SOLN
INTRAMUSCULAR | Status: AC
  Filled 2019-09-14: qty 2

## 2019-09-14 MED ORDER — SODIUM ZIRCONIUM CYCLOSILICATE 5 G PO PACK
5.0000 g | PACK | Freq: Once | ORAL | Status: AC
  Administered 2019-09-14: 5 g
  Filled 2019-09-14: qty 1

## 2019-09-14 MED ORDER — MIDAZOLAM HCL 2 MG/2ML IJ SOLN
INTRAMUSCULAR | Status: AC
  Filled 2019-09-14: qty 2

## 2019-09-14 MED ORDER — VANCOMYCIN HCL IN DEXTROSE 1-5 GM/200ML-% IV SOLN
1000.0000 mg | INTRAVENOUS | Status: AC
  Filled 2019-09-14 (×2): qty 200

## 2019-09-14 MED ORDER — CALCIUM GLUCONATE-NACL 1-0.675 GM/50ML-% IV SOLN
1.0000 g | Freq: Once | INTRAVENOUS | Status: AC
  Administered 2019-09-14: 1000 mg via INTRAVENOUS
  Filled 2019-09-14: qty 50

## 2019-09-14 MED ORDER — SODIUM CHLORIDE 0.9 % IV SOLN: INTRAVENOUS | Status: DC | PRN

## 2019-09-14 MED ORDER — SODIUM CHLORIDE 0.9 % IR SOLN
Status: DC | PRN
  Administered 2019-09-14: 2000 mL

## 2019-09-14 MED ORDER — ROCURONIUM BROMIDE 10 MG/ML (PF) SYRINGE
PREFILLED_SYRINGE | INTRAVENOUS | Status: DC | PRN
  Administered 2019-09-14: 60 mg via INTRAVENOUS

## 2019-09-14 MED ORDER — SODIUM CHLORIDE 0.9% IV SOLUTION: Freq: Once | INTRAVENOUS | Status: DC

## 2019-09-14 MED ORDER — ONDANSETRON HCL 4 MG/2ML IJ SOLN
INTRAMUSCULAR | Status: AC
  Filled 2019-09-14: qty 2

## 2019-09-14 MED ORDER — SODIUM BICARBONATE 8.4 % IV SOLN
50.0000 meq | Freq: Once | INTRAVENOUS | Status: AC
  Administered 2019-09-14: 50 meq via INTRAVENOUS
  Filled 2019-09-14: qty 50

## 2019-09-14 MED ORDER — FENTANYL CITRATE (PF) 250 MCG/5ML IJ SOLN
INTRAMUSCULAR | Status: AC
  Filled 2019-09-14: qty 5

## 2019-09-14 MED ORDER — VANCOMYCIN HCL 1000 MG IV SOLR
INTRAVENOUS | Status: DC | PRN
  Administered 2019-09-14: 1000 mg via INTRAVENOUS

## 2019-09-14 MED ORDER — ONDANSETRON HCL 4 MG/2ML IJ SOLN
INTRAMUSCULAR | Status: DC | PRN
  Administered 2019-09-14: 4 mg via INTRAVENOUS

## 2019-09-14 MED ORDER — PHENYLEPHRINE HCL-NACL 10-0.9 MG/250ML-% IV SOLN
INTRAVENOUS | Status: DC | PRN
  Administered 2019-09-14: 50 ug/min via INTRAVENOUS

## 2019-09-14 MED FILL — Medication: Qty: 1 | Status: AC

## 2019-09-14 SURGICAL SUPPLY — 103 items
ADAPTER CARDIO PERF ANTE/RETRO (ADAPTER) IMPLANT
APPLIER CLIP 9.375 MED OPEN (MISCELLANEOUS)
APPLIER CLIP 9.375 SM OPEN (CLIP)
BAG DECANTER FOR FLEXI CONT (MISCELLANEOUS) ×2 IMPLANT
BLADE CLIPPER SURG (BLADE) ×2 IMPLANT
BLADE SURG 11 STRL SS (BLADE) IMPLANT
CANISTER SUCT 3000ML PPV (MISCELLANEOUS) ×2 IMPLANT
CANN PRFSN .5XCNCT 15X34-48 (MISCELLANEOUS) ×1
CANNULA GUNDRY RCSP 15FR (MISCELLANEOUS) IMPLANT
CANNULA PRFSN .5XCNCT 15X34-48 (MISCELLANEOUS) ×1 IMPLANT
CANNULA VEN 2 STAGE (MISCELLANEOUS) ×2
CATH CPB KIT GERHARDT (MISCELLANEOUS) ×2 IMPLANT
CATH THORACIC 28FR (CATHETERS) IMPLANT
CLIP APPLIE 9.375 MED OPEN (MISCELLANEOUS) IMPLANT
CLIP APPLIE 9.375 SM OPEN (CLIP) IMPLANT
CLIP FOGARTY SPRING 6M (CLIP) IMPLANT
CLIP VESOCCLUDE MED 24/CT (CLIP) IMPLANT
CLIP VESOCCLUDE SM WIDE 24/CT (CLIP) IMPLANT
CONN Y 3/8X3/8X3/8  BEN (MISCELLANEOUS)
CONN Y 3/8X3/8X3/8 BEN (MISCELLANEOUS) IMPLANT
COVER SURGICAL LIGHT HANDLE (MISCELLANEOUS) ×4 IMPLANT
DRAIN CHANNEL 19F RND (DRAIN) ×4 IMPLANT
DRAIN CHANNEL 28F RND 3/8 FF (WOUND CARE) ×2 IMPLANT
DRAPE CARDIOVASCULAR INCISE (DRAPES) ×2
DRAPE SRG 135X102X78XABS (DRAPES) ×1 IMPLANT
DRESSING PREVENA PLUS CUSTOM (GAUZE/BANDAGES/DRESSINGS) ×1 IMPLANT
DRSG AQUACEL AG ADV 3.5X14 (GAUZE/BANDAGES/DRESSINGS) ×2 IMPLANT
DRSG PREVENA PLUS CUSTOM (GAUZE/BANDAGES/DRESSINGS) ×2
ELECT BLADE 4.0 EZ CLEAN MEGAD (MISCELLANEOUS) ×2
ELECT BLADE 6.5 EXT (BLADE) ×4 IMPLANT
ELECT CAUTERY BLADE 6.4 (BLADE) IMPLANT
ELECT REM PT RETURN 9FT ADLT (ELECTROSURGICAL) ×4
ELECTRODE BLDE 4.0 EZ CLN MEGD (MISCELLANEOUS) ×1 IMPLANT
ELECTRODE REM PT RTRN 9FT ADLT (ELECTROSURGICAL) ×2 IMPLANT
EVACUATOR SILICONE 100CC (DRAIN) ×4 IMPLANT
FILTER SMOKE EVAC ULPA (FILTER) ×2 IMPLANT
GAUZE SPONGE 4X4 12PLY STRL (GAUZE/BANDAGES/DRESSINGS) ×4 IMPLANT
GAUZE SPONGE 4X4 12PLY STRL LF (GAUZE/BANDAGES/DRESSINGS) ×2 IMPLANT
GLOVE BIO SURGEON STRL SZ 6 (GLOVE) IMPLANT
GLOVE BIO SURGEON STRL SZ 6.5 (GLOVE) IMPLANT
GLOVE BIO SURGEON STRL SZ7 (GLOVE) IMPLANT
GLOVE BIO SURGEON STRL SZ7.5 (GLOVE) IMPLANT
GOWN STRL REUS W/ TWL LRG LVL3 (GOWN DISPOSABLE) ×4 IMPLANT
GOWN STRL REUS W/TWL LRG LVL3 (GOWN DISPOSABLE) ×8
HEMOSTAT POWDER SURGIFOAM 1G (HEMOSTASIS) IMPLANT
INSERT FOGARTY XLG (MISCELLANEOUS) IMPLANT
KIT BASIN OR (CUSTOM PROCEDURE TRAY) ×2 IMPLANT
KIT SUCTION CATH 14FR (SUCTIONS) IMPLANT
KIT TURNOVER KIT B (KITS) ×2 IMPLANT
KIT VASOVIEW HEMOPRO 2 VH 4000 (KITS) IMPLANT
LEAD PACING MYOCARDI (MISCELLANEOUS) ×2 IMPLANT
LINE VENT (MISCELLANEOUS) IMPLANT
NS IRRIG 1000ML POUR BTL (IV SOLUTION) ×10 IMPLANT
PACK CHEST (CUSTOM PROCEDURE TRAY) ×2 IMPLANT
PACK E OPEN HEART (SUTURE) ×2 IMPLANT
PACK UNIVERSAL I (CUSTOM PROCEDURE TRAY) ×2 IMPLANT
PAD ARMBOARD 7.5X6 YLW CONV (MISCELLANEOUS) ×4 IMPLANT
PAD ELECT DEFIB RADIOL ZOLL (MISCELLANEOUS) ×2 IMPLANT
PAD NEG PRESSURE SENSATRAC (MISCELLANEOUS) ×2 IMPLANT
PENCIL SMOKE EVACUATOR (MISCELLANEOUS) ×2 IMPLANT
POSITIONER HEAD DONUT 9IN (MISCELLANEOUS) ×2 IMPLANT
SET CARDIOPLEGIA MPS 5001102 (MISCELLANEOUS) IMPLANT
SLEEVE SUCTION 125 (MISCELLANEOUS) ×2 IMPLANT
SOL ANTI FOG 6CC (MISCELLANEOUS) IMPLANT
SOLUTION ANTI FOG 6CC (MISCELLANEOUS)
SPONGE LAP 18X18 RF (DISPOSABLE) IMPLANT
SPONGE LAP 4X18 RFD (DISPOSABLE) IMPLANT
STOPCOCK 4 WAY LG BORE MALE ST (IV SETS) IMPLANT
SUT ETHIBOND 2 0 SH (SUTURE)
SUT ETHIBOND 2 0 SH 36X2 (SUTURE) IMPLANT
SUT MNCRL AB 4-0 PS2 18 (SUTURE) ×10 IMPLANT
SUT PROLENE 3 0 SH 1 (SUTURE) IMPLANT
SUT PROLENE 4 0 RB 1 (SUTURE)
SUT PROLENE 4 0 TF (SUTURE) ×4 IMPLANT
SUT PROLENE 4-0 RB1 .5 CRCL 36 (SUTURE) IMPLANT
SUT PROLENE 5 0 C 1 36 (SUTURE) IMPLANT
SUT PROLENE 6 0 C 1 30 (SUTURE) IMPLANT
SUT PROLENE 7 0 BV 1 (SUTURE) IMPLANT
SUT PROLENE 7 0 BV1 MDA (SUTURE) IMPLANT
SUT PROLENE 8 0 BV175 6 (SUTURE) IMPLANT
SUT SILK  1 MH (SUTURE) ×6
SUT SILK 1 MH (SUTURE) ×3 IMPLANT
SUT STEEL 6MS V (SUTURE) IMPLANT
SUT STEEL STERNAL CCS#1 18IN (SUTURE) IMPLANT
SUT STEEL SZ 6 DBL 3X14 BALL (SUTURE) IMPLANT
SUT VIC AB 1 CTX 18 (SUTURE) ×4 IMPLANT
SUT VIC AB 1 CTX 36 (SUTURE)
SUT VIC AB 1 CTX36XBRD ANBCTR (SUTURE) IMPLANT
SUT VIC AB 2-0 CT1 36 (SUTURE) IMPLANT
SUT VIC AB 2-0 CTX 27 (SUTURE) IMPLANT
SUT VIC AB 3-0 SH 27 (SUTURE)
SUT VIC AB 3-0 SH 27X BRD (SUTURE) IMPLANT
SUT VIC AB 3-0 X1 27 (SUTURE) IMPLANT
SUT VICRYL 4-0 PS2 18IN ABS (SUTURE) IMPLANT
SYSTEM SAHARA CHEST DRAIN ATS (WOUND CARE) IMPLANT
TOWEL GREEN STERILE (TOWEL DISPOSABLE) ×2 IMPLANT
TOWEL GREEN STERILE FF (TOWEL DISPOSABLE) ×2 IMPLANT
TRAP FLUID SMOKE EVACUATOR (MISCELLANEOUS) ×2 IMPLANT
TRAY CATH LUMEN 1 20CM STRL (SET/KITS/TRAYS/PACK) IMPLANT
TUBE FEEDING 8FR 16IN STR KANG (MISCELLANEOUS) ×2 IMPLANT
TUBING LAP HI FLOW INSUFFLATIO (TUBING) IMPLANT
UNDERPAD 30X36 HEAVY ABSORB (UNDERPADS AND DIAPERS) ×2 IMPLANT
WATER STERILE IRR 1000ML POUR (IV SOLUTION) ×4 IMPLANT

## 2019-09-14 NOTE — Transfer of Care (Signed)
Immediate Anesthesia Transfer of Care Note  Patient: Christian [name]  Procedure(s) Performed: EVACUATION OF HEMATOMA (N/A )  Patient Location: ICU  Anesthesia Type:General  Level of Consciousness: drowsy and Patient remains intubated per anesthesia plan  Airway & Oxygen Therapy: Patient Spontanous Breathing, Patient remains intubated per anesthesia plan and Patient placed on Ventilator (see vital sign flow sheet for setting)  Post-op Assessment: Report given to RN and Post -op Vital signs reviewed and stable  Post vital signs: Reviewed and stable  Last Vitals:  Vitals Value Taken Time  BP    Temp    Pulse    Resp    SpO2      Last Pain:  Vitals:   09/14/19 0656  TempSrc: Oral  PainSc: 0-No pain      Patients Stated Pain Goal: 0 (62/19/47 1252)  Complications: No apparent anesthesia complications

## 2019-09-14 NOTE — Progress Notes (Signed)
SLP Cancellation Note  Patient Details Name: Christian [name] MRN: [id number] DOB: 02-Sep-1945   Cancelled treatment:       Reason Eval/Treat Not Completed: Other (comment). Will d/c cognitive linguistic eval orders at this time. Please reorder SLP when pt ready for PMSV and swallow evaluations.    Taneal Sonntag, Katherene Ponto 09/14/2019, 7:55 AM

## 2019-09-14 NOTE — Addendum Note (Signed)
Addendum  created 09/14/19 1258 by Orlie Dakin, CRNA   Flowsheet accepted, Intraprocedure Flowsheets edited

## 2019-09-14 NOTE — Progress Notes (Signed)
8 Days Post-Op  Subjective: Nursing team and CT team at bedside due to concern for hematoma after being evaluated by HD team.  Objective: Vital signs in last 24 hours: Temp:  [97.7 F (36.5 C)-99.5 F (37.5 C)] 97.7 F (36.5 C) (05/20 0743) Pulse Rate:  [90-121] 121 (05/20 0803) Resp:  [16-32] 32 (05/20 0803) BP: (93-141)/(49-80) 108/49 (05/20 0803) SpO2:  [100 %] 100 % (05/20 0809) FiO2 (%):  [40 %] 40 % (05/20 0809) Weight:  [86.9 kg-87.6 kg] 87.6 kg (05/20 0656) Last BM Date: 09/13/19  Intake/Output from previous day: 05/19 0701 - 05/20 0700 In: 795.3 [I.V.:195.3; NG/GT:600] Out: 1090 [Drains:390; Stool:700] Intake/Output this shift: No intake/output data recorded.  General: Patient in bed, on vent, HD cath in place, NG tube in place Chest wall: Swelling of mediastinum noted, incision is c/d/i. JP drain with darker blood than previously noted.    Lab Results:  CBC Latest Ref Rng & Units 09/14/2019 09/13/2019 09/10/2019  WBC 4.0 - 10.5 K/uL 8.8 7.7 5.4  Hemoglobin 13.0 - 17.0 g/dL 7.2(L) 8.7(L) 9.5(L)  Hematocrit 39.0 - 52.0 % 22.8(L) 27.9(L) 29.4(L)  Platelets 150 - 400 K/uL 362 309 222    BMET Recent Labs    09/13/19 0531 09/14/19 0225  NA 135 133*  K 4.8 5.4*  CL 95* 96*  CO2 24 20*  GLUCOSE 118* 196*  BUN 62* 96*  CREATININE 5.16* 6.83*  CALCIUM 8.4* 8.3*   PT/INR No results for input(s): LABPROT, INR in the last 72 hours. ABG No results for input(s): PHART, HCO3 in the last 72 hours.  Invalid input(s): PCO2, PO2  Studies/Results: DG Chest Port 1 View  Result Date: 09/12/2019 CLINICAL DATA:  Congestive heart failure. EXAM: PORTABLE CHEST 1 VIEW COMPARISON:  Sep 10, 2019. FINDINGS: Stable cardiomegaly. Tracheostomy and feeding tubes are unchanged in position. Left subclavian catheter is noted with tip in expected position of the SVC. No pneumothorax is noted. Right lung is clear. Mild left basilar atelectasis or infiltrate is noted with associated pleural  effusion. Bony thorax is unremarkable. IMPRESSION: Stable support apparatus. Mild left basilar atelectasis or infiltrate is noted with associated pleural effusion. Electronically Signed   By: Marijo Conception M.D.   On: 09/12/2019 09:24    Anti-infectives: Anti-infectives (From admission, onward)   Start     Dose/Rate Route Frequency Ordered Stop   09/07/19 2000  vancomycin (VANCOREADY) IVPB 750 mg/150 mL     750 mg 150 mL/hr over 60 Minutes Intravenous  Once 09/07/19 1510 09/07/19 2245   09/06/19 2200  meropenem (MERREM) 500 mg in sodium chloride 0.9 % 100 mL IVPB     500 mg 200 mL/hr over 30 Minutes Intravenous Every 24 hours 09/06/19 1542 09/09/19 0015   09/06/19 2000  fluconazole (DIFLUCAN) IVPB 400 mg     400 mg 100 mL/hr over 120 Minutes Intravenous Every 24 hours 09/06/19 1542 09/08/19 2240   09/06/19 1800  vancomycin (VANCOREADY) IVPB 750 mg/150 mL     750 mg 150 mL/hr over 60 Minutes Intravenous  Once 09/06/19 1542 09/07/19 0022   09/06/19 1540  vancomycin variable dose per unstable renal function (pharmacist dosing)      Does not apply See admin instructions 09/06/19 1542 09/08/19 2359   09/06/19 1100  ceFAZolin (ANCEF) IVPB 2g/100 mL premix  Status:  Discontinued     2 g 200 mL/hr over 30 Minutes Intravenous 30 min pre-op 09/05/19 1717 09/06/19 1644   09/06/19 0600  ceFAZolin (ANCEF) IVPB 2g/100  mL premix  Status:  Discontinued     2 g 200 mL/hr over 30 Minutes Intravenous To Short Stay 09/06/19 0546 09/06/19 1644   09/02/19 2200  meropenem (MERREM) 1 g in sodium chloride 0.9 % 100 mL IVPB  Status:  Discontinued     1 g 200 mL/hr over 30 Minutes Intravenous Every 8 hours 09/02/19 1011 09/06/19 1542   09/02/19 2000  fluconazole (DIFLUCAN) IVPB 800 mg  Status:  Discontinued     800 mg 100 mL/hr over 240 Minutes Intravenous Every 24 hours 09/02/19 1011 09/06/19 1542   09/02/19 2000  fluconazole (DIFLUCAN) IVPB 800 mg  Status:  Discontinued     800 mg 200 mL/hr over 120  Minutes Intravenous Every 24 hours 09/02/19 1329 09/02/19 1330   09/02/19 1800  vancomycin (VANCOCIN) IVPB 1000 mg/200 mL premix  Status:  Discontinued     1,000 mg 200 mL/hr over 60 Minutes Intravenous Every 24 hours 09/02/19 1011 09/06/19 1542   09/02/19 0900  meropenem (MERREM) 1 g in sodium chloride 0.9 % 100 mL IVPB  Status:  Discontinued     1 g 200 mL/hr over 30 Minutes Intravenous Every 24 hours 09/01/19 1333 09/02/19 1011   09/01/19 1700  fluconazole (DIFLUCAN) IVPB 800 mg     800 mg 100 mL/hr over 240 Minutes Intravenous  Once 09/01/19 1645 09/01/19 2057   09/01/19 1630  fluconazole (DIFLUCAN) IVPB 400 mg  Status:  Discontinued     400 mg 100 mL/hr over 120 Minutes Intravenous  Once 09/01/19 1629 09/01/19 1645   09/01/19 1330  vancomycin variable dose per unstable renal function (pharmacist dosing)  Status:  Discontinued      Does not apply See admin instructions 09/01/19 1333 09/02/19 1325   09/01/19 0730  meropenem (MERREM) 1 g in sodium chloride 0.9 % 100 mL IVPB  Status:  Discontinued     1 g 200 mL/hr over 30 Minutes Intravenous Every 8 hours 09/01/19 0729 09/01/19 1327   08/31/19 2200  vancomycin (VANCOCIN) IVPB 1000 mg/200 mL premix  Status:  Discontinued     1,000 mg 200 mL/hr over 60 Minutes Intravenous Every 24 hours 08/30/19 2235 09/01/19 1330   08/25/19 0800  vancomycin (VANCOREADY) IVPB 1250 mg/250 mL  Status:  Discontinued     1,250 mg 166.7 mL/hr over 90 Minutes Intravenous Every 24 hours 08/24/19 2121 08/24/19 2128   08/25/19 0100  vancomycin (VANCOREADY) IVPB 1250 mg/250 mL  Status:  Discontinued     1,250 mg 166.7 mL/hr over 90 Minutes Intravenous Every 24 hours 08/24/19 2128 08/30/19 2235   08/24/19 2300  ceFEPIme (MAXIPIME) 2 g in sodium chloride 0.9 % 100 mL IVPB  Status:  Discontinued     2 g 200 mL/hr over 30 Minutes Intravenous Every 12 hours 08/24/19 2150 09/01/19 0729   08/24/19 1338  vancomycin (VANCOCIN) 1,000 mg in sodium chloride 0.9 % 1,000 mL  irrigation  Status:  Discontinued       As needed 08/24/19 1338 08/24/19 1508   08/21/19 0000  ceFEPIme (MAXIPIME) 1 g in sodium chloride 0.9 % 100 mL IVPB  Status:  Discontinued     1 g 200 mL/hr over 30 Minutes Intravenous Every 24 hours 08/20/19 0954 08/24/19 2150   08/20/19 1200  vancomycin (VANCOCIN) IVPB 1000 mg/200 mL premix     1,000 mg 200 mL/hr over 60 Minutes Intravenous  Once 08/20/19 0954 08/21/19 0011   08/19/19 2129  vancomycin variable dose per unstable renal function (pharmacist  dosing)  Status:  Discontinued      Does not apply See admin instructions 08/19/19 2129 08/24/19 2128   08/19/19 0000  ceFEPIme (MAXIPIME) 2 g in sodium chloride 0.9 % 100 mL IVPB  Status:  Discontinued     2 g 200 mL/hr over 30 Minutes Intravenous Every 24 hours 08/18/19 2351 08/20/19 0954   08/19/19 0000  vancomycin (VANCOREADY) IVPB 2000 mg/400 mL     2,000 mg 200 mL/hr over 120 Minutes Intravenous  Once 08/18/19 2351 08/19/19 0354   08/16/19 1731  vancomycin (VANCOCIN) powder  Status:  Discontinued       As needed 08/16/19 1732 08/16/19 1808   08/15/19 2045  vancomycin (VANCOCIN) IVPB 1000 mg/200 mL premix     1,000 mg 200 mL/hr over 60 Minutes Intravenous  Once 08/15/19 1402 08/15/19 2209   08/15/19 1645  cefUROXime (ZINACEF) 1.5 g in sodium chloride 0.9 % 100 mL IVPB     1.5 g 200 mL/hr over 30 Minutes Intravenous Every 12 hours 08/15/19 1402 08/17/19 0700   08/15/19 0958  vancomycin (VANCOCIN) powder  Status:  Discontinued       As needed 08/15/19 1000 08/15/19 1402   08/15/19 0400  vancomycin (VANCOREADY) IVPB 1500 mg/300 mL     1,500 mg 150 mL/hr over 120 Minutes Intravenous To Surgery 08/14/19 1121 08/15/19 0830   08/15/19 0400  cefUROXime (ZINACEF) 1.5 g in sodium chloride 0.9 % 100 mL IVPB     1.5 g 200 mL/hr over 30 Minutes Intravenous To Surgery 08/14/19 1121 08/15/19 0812   08/15/19 0400  cefUROXime (ZINACEF) 750 mg in sodium chloride 0.9 % 100 mL IVPB     750 mg 200 mL/hr  over 30 Minutes Intravenous To Surgery 08/14/19 1121 08/15/19 1300      Assessment/Plan: s/p Procedure(s): MEDIASTINAL EXPLORATION STERNAL PLATING LAPAROSCOPIC OMENTAL HARVEST APPLICATION OF ACELL, APPLICATION OF WOUND VAC USING [place] INCISIONAL  DRESSING Pectoralis ADVANCEMENT Flap  CT team planning for urgent OR due to mediastinal hematoma, will be available as need.    LOS: 36 days    Charlies Constable, PA-C 09/14/2019

## 2019-09-14 NOTE — Op Note (Signed)
DATE OF OPERATION: 09/14/2019  LOCATION: [facility] Room  PREOPERATIVE DIAGNOSIS: Chest hematoma  POSTOPERATIVE DIAGNOSIS: Same  PROCEDURE: Evacuation of chest hematoma  SURGEON: Lanelle Bal, MD  ASSISTANT: Lyndee Leo Sanger Meoshia Billing, DO  EBL: 500 cc  CONDITION: Stable  COMPLICATIONS: None  INDICATION: The patient, Christian [name], is a 74 y.o. male born on Feb 25, 1946, is here for treatment for a chest hematoma.  The patient's had a very complicated course.  Patient underwent open heart surgery followed by cardiac complications and an open sternal wound.  A joint case was arranged with Dr. Servando Snare and plastic surgery.  The chest wound was debrided and a turnover pectoralis flap from the right was performed with stabilization of the sternal bone with heart wire.  The patient was started on heparin over the last few days.  Unfortunately this led to a hematoma in the chest pocket.  The decision was made to take the patient to the OR for evacuation.   PROCEDURE DETAILS:  The patient was seen prior to surgery and marked.  The IV antibiotics were given. The patient was taken to the operating room and given a general anesthetic. A standard time out was performed and all information was confirmed by those in the room. SCDs were placed.   The patient was prepped and draped.  The existing doing drain was removed from the right side.  2 stitches were placed to keep the sterile field clean.  A combination of the 10 blade and tissue scissors were used to open the superior portion of the incision site over the sternum.  Once we got to the chest pocket which included the area superficial to the pectoralis turnover flap, we were able to drain the hematoma.  There was approximately 500 cc evacuated.  This was collected in the Cell Saver and then given back to the patient once it was identified that was no sign of infection.  The pocket was irrigated with saline multiple times.  The pocket was inspected  for active bleeding.  There were no active vessels bleeding and no perforators noted to be at avoid bleeding.  Hemoblast was placed in the pocket.  It was then covered with a moist saline sponge.  We waited 3 minutes and then removed the sponge.  There was no accumulation of blood, hematoma or drainage.  219 Blake drains were placed.  One on the right and 1 on the left.  These were secured to the chest skin with nylon suture.  The muscle flap appeared to be in tact.  There was no sign of purulence.  The hardware was covered.  The incision was closed using a combination of 3 and 4-0 Monocryl vertical mattress and running stitch.  The Praveena was applied on top of the incision.  The patient was allowed to wake up and taken to the ICU in stable condition at the end of the case.   Dr. Marla Roe assisted throughout the case.  The plastic surgery was essential in retraction and counter traction when needed to make the case progress smoothly.  This retraction and assistance made it possible to see the tissue plans for the procedure.  The assistance was needed for blood control, tissue re-approximation and assisted with closure of the incision site.

## 2019-09-14 NOTE — Progress Notes (Addendum)
Patient ID: Christian [name], male   DOB: 1946/04/06, 74 y.o.   MRN: [id number] TCTS DAILY ICU PROGRESS NOTE                   [address]            [place] [address]          [phone]   8 Days Post-Op Procedure(s) (LRB): MEDIASTINAL EXPLORATION (N/A) STERNAL PLATING (N/A) LAPAROSCOPIC OMENTAL HARVEST (N/A) APPLICATION OF ACELL, APPLICATION OF WOUND VAC USING PREVENA INCISIONAL  DRESSING (N/A) Pectoralis ADVANCEMENT Flap  Total Length of Stay:  LOS: 36 days   Subjective: Called emergently to see patient due to rapidly expanding swelling anterior chest  While on heparin drip  Objective: Vital signs in last 24 hours: Temp:  [97.7 F (36.5 C)-99.5 F (37.5 C)] 97.7 F (36.5 C) (05/20 0743) Pulse Rate:  [90-121] 121 (05/20 0803) Cardiac Rhythm: Sinus tachycardia (05/20 0400) Resp:  [16-32] 32 (05/20 0803) BP: (93-141)/(49-80) 108/49 (05/20 0803) SpO2:  [100 %] 100 % (05/20 0809) FiO2 (%):  [40 %] 40 % (05/20 0809) Weight:  [86.9 kg-87.6 kg] 87.6 kg (05/20 0656)  Filed Weights   09/13/19 0500 09/14/19 0400 09/14/19 0656  Weight: 87.2 kg 86.9 kg 87.6 kg    Weight change: -0.5 kg   Hemodynamic parameters for last 24 hours:    Intake/Output from previous day: 05/19 0701 - 05/20 0700 In: 795.3 [I.V.:195.3; NG/GT:600] Out: 1090 [Drains:390; Stool:700]  Intake/Output this shift: No intake/output data recorded.  Current Meds: Scheduled Meds: .  stroke: mapping our early stages of recovery book   Does not apply Once  . sodium chloride   Intravenous Once  . sodium chloride   Intravenous Once  . sodium chloride   Intravenous Once  . amiodarone  200 mg Per Tube BID  . arformoterol  15 mcg Nebulization BID  . aspirin  81 mg Per Tube Daily  . atorvastatin  40 mg Per Tube Daily  . B-complex with vitamin C  1 tablet Per Tube Daily  . bisacodyl  10 mg Oral Daily   Or  . bisacodyl  10 mg Rectal Daily  . chlorhexidine gluconate (MEDLINE KIT)  15 mL  Mouth Rinse BID  . Chlorhexidine Gluconate Cloth  6 each Topical Q0600  . darbepoetin (ARANESP) injection - NON-DIALYSIS  150 mcg Subcutaneous Q Mon-1800  . docusate  100 mg Per Tube BID  . feeding supplement (PRO-STAT SUGAR FREE 64)  60 mL Per Tube TID  . fentaNYL      . heparin sodium (porcine)      . insulin aspart  0-24 Units Subcutaneous Q4H  . insulin detemir  10 Units Subcutaneous BID  . mouth rinse  15 mL Mouth Rinse 10 times per day  . midodrine  10 mg Oral TID WC  . pantoprazole sodium  40 mg Per Tube Daily  . revefenacin  175 mcg Nebulization Daily  . sertraline  25 mg Oral Daily  . sodium chloride flush  10-40 mL Intracatheter Q12H  . sodium chloride flush  10-40 mL Intracatheter Q12H  . trospium  20 mg Per Tube BID   Continuous Infusions: . sodium chloride 250 mL (09/13/19 0853)  . sodium chloride    . sodium chloride    . feeding supplement (VITAL 1.5 CAL) 1,000 mL (09/13/19 2131)  . heparin 1,100 Units/hr (09/14/19 0504)  . lactated ringers 20 mL/hr at 08/20/19 1200   PRN Meds:.sodium chloride,  sodium chloride, acetaminophen, albuterol, ALPRAZolam, alteplase, cromolyn, dextrose, heparin, hydrALAZINE, HYDROmorphone (DILAUDID) injection, lidocaine (PF), lidocaine-prilocaine, ondansetron (ZOFRAN) IV, oxyCODONE, pentafluoroprop-tetrafluoroeth, sodium chloride flush, sodium chloride flush   Remains on vent , extensive expansion of the anterior chest , needle aspiration , blood no air   Lab Results: CBC: Recent Labs    09/13/19 0531 09/14/19 0225  WBC 7.7 8.8  HGB 8.7* 7.2*  HCT 27.9* 22.8*  PLT 309 362   BMET:  Recent Labs    09/13/19 0531 09/14/19 0225  NA 135 133*  K 4.8 5.4*  CL 95* 96*  CO2 24 20*  GLUCOSE 118* 196*  BUN 62* 96*  CREATININE 5.16* 6.83*  CALCIUM 8.4* 8.3*    CMET: Lab Results  Component Value Date   WBC 8.8 09/14/2019   HGB 7.2 (L) 09/14/2019   HCT 22.8 (L) 09/14/2019   PLT 362 09/14/2019   GLUCOSE 196 (H) 09/14/2019   CHOL  107 08/11/2019   TRIG 71 08/27/2019   HDL 42 08/11/2019   LDLDIRECT 40.0 11/23/2018   LDLCALC 40 08/11/2019   ALT 127 (H) 09/07/2019   AST 72 (H) 09/07/2019   NA 133 (L) 09/14/2019   K 5.4 (H) 09/14/2019   CL 96 (L) 09/14/2019   CREATININE 6.83 (H) 09/14/2019   BUN 96 (H) 09/14/2019   CO2 20 (L) 09/14/2019   TSH 2.590 04/19/2019   INR 1.3 (H) 08/19/2019   HGBA1C 7.7 (H) 08/14/2019      PT/INR: No results for input(s): LABPROT, INR in the last 72 hours. Radiology: No results found.   Assessment/Plan: S/P Procedure(s) (LRB): MEDIASTINAL EXPLORATION (N/A) STERNAL PLATING (N/A) LAPAROSCOPIC OMENTAL HARVEST (N/A) APPLICATION OF ACELL, APPLICATION OF WOUND VAC USING PREVENA INCISIONAL  DRESSING (N/A) Pectoralis ADVANCEMENT Flap    Emergency return to or for chest wall/flap bleeding complication of heparin  Nurses have contact wife for consent as we move to OR ASAP directly talk to wife , on cell phone and explained the urgency of the situation    Grace Shiquan 09/14/2019 8:10 AM

## 2019-09-14 NOTE — Progress Notes (Signed)
27 minutes into hemodialysis treatment patient chest noted to be swollen, treatment terminated, blood not returned to patient.

## 2019-09-14 NOTE — Anesthesia Preprocedure Evaluation (Addendum)
Anesthesia Evaluation  Patient identified by MRN, date of birth, ID band  Reviewed: Allergy & Precautions, Patient's Chart, lab work & pertinent test results, Unable to perform ROS - Chart review onlyPreop documentation limited or incomplete due to emergent nature of procedure.  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  Reproductive/Obstetrics negative OB ROS                          Anesthesia Physical Anesthesia Plan  ASA: IV and emergent  Anesthesia Plan: General   Post-op Pain Management:    Induction:   PONV Risk Score and Plan: 3 and Treatment may vary due to age or medical condition, Midazolam and Ondansetron  Airway Management Planned: Tracheostomy  Additional Equipment: Arterial line and CVP  Intra-op Plan:   Post-operative Plan: Post-operative intubation/ventilation  Informed Consent:     History available from chart only and Only emergency history available  Plan Discussed with:   Anesthesia Plan Comments: (Expanding hematoma in ICU, brought emergently to OR for mediastinal exploration. Daiva Huge, MD)       Anesthesia Quick Evaluation

## 2019-09-14 NOTE — Anesthesia Procedure Notes (Signed)
Arterial Line Insertion Start/End5/20/2021 9:00 AM, 09/14/2019 9:03 AM Performed by: Brennan Bailey, MD, anesthesiologist  Patient location: Pre-op. Preanesthetic checklist: patient identified, IV checked, site marked, risks and benefits discussed, surgical consent, monitors and equipment checked, pre-op evaluation, timeout performed and anesthesia consent Patient sedated radial was placed Catheter size: 20 Fr Hand hygiene performed  and maximum sterile barriers used   Attempts: 2 Procedure performed using ultrasound guided technique. Ultrasound Notes:anatomy identified, needle tip was noted to be adjacent to the nerve/plexus identified and no ultrasound evidence of intravascular and/or intraneural injection Following insertion, dressing applied and Biopatch. Post procedure assessment: normal and unchanged  Patient tolerated the procedure well with no immediate complications.

## 2019-09-14 NOTE — Brief Op Note (Signed)
08/09/2019 - 09/14/2019  10:06 AM  PATIENT:  Christian [name]  74 y.o. male  PRE-OPERATIVE DIAGNOSIS:  Bleeding chest wall   POST-OPERATIVE DIAGNOSIS: same   PROCEDURE:  Procedure(s): Medialstinal Exploration, WOUND VAC APPLICATION (N/A)  SURGEON:  Surgeon(s) and Role:    Grace Zeev, MD - Primary     ANESTHESIA:   general  EBL:  1000 mL   BLOOD ADMINISTERED:315 CC PRBC and 400 CC CELLSAVER  DRAINS: (63  ) Blake drain(s) in the under chest wall flaps  X 2   LOCAL MEDICATIONS USED:  NONE  SPECIMEN:  Source of Specimen:  culture of mediastinal blood  DISPOSITION OF SPECIMEN:  micro  COUNTS:  YES   DICTATION: .Dragon Dictation  PLAN OF CARE: in patient   PATIENT DISPOSITION:  ICU - intubated and critically ill.   Delay start of Pharmacological VTE agent (>24hrs) due to surgical blood loss or risk of bleeding: yes , NO HEPARIN

## 2019-09-14 NOTE — Anesthesia Procedure Notes (Signed)
Date/Time: 09/14/2019 8:52 AM Performed by: Orlie Dakin, CRNA Pre-anesthesia Checklist: Patient identified, Emergency Drugs available, Suction available and Patient being monitored Patient Re-evaluated:Patient Re-evaluated prior to induction Oxygen Delivery Method: Circle system utilized Induction Type: Tracheostomy and Inhalational induction with existing ETT Placement Confirmation: positive ETCO2 and breath sounds checked- equal and bilateral Comments: Existing trach and connected to anesthesia machine, inhalation agent on.

## 2019-09-14 NOTE — Progress Notes (Addendum)
ANTICOAGULATION CONSULT NOTE - Follow Up Consult  Pharmacy Consult for heparin Indication: PE in setting of possible small stroke and now with some bleeding  Labs: Recent Labs    09/12/19 0510 09/13/19 0531 09/13/19 1254 09/14/19 0225  HGB  --  8.7*  --  7.2*  HCT  --  27.9*  --  22.8*  PLT  --  309  --  362  APTT 39*  --   --   --   HEPARINUNFRC <0.10*  --  0.91* 0.57  CREATININE 7.83* 5.16*  --   --     Assessment: 74yo male had heparin paused and Thrombi-pad placed for bleeding from HD catheter site; d/w RN this afternoon and still some slight bleeding around HD site.   Heparin level came back slightly supratherapeutic at 0.57, on 1200 units/hr. Drawn correctly from opposite arm. Minimal bleeding per RN. Hgb down to 7.2, plt 362 this AM.   Goal of Therapy:  Heparin level 0.3-0.5 units/ml   Plan:  Reduce heparin to 1100 units/hr Heparin level in 8 hours Monitor CBC, HL, and for s/sx of bleeding   Antonietta Jewel, PharmD, BCCCP Clinical Pharmacist  09/14/2019 3:16 AM  Please check AMION for all [place] phone numbers After 10:00 PM, call [place] [phone]

## 2019-09-14 NOTE — Addendum Note (Signed)
Addendum  created 09/14/19 1123 by Orlie Dakin, CRNA   Child order released for a procedure order, Clinical Note Signed, Intraprocedure Blocks edited

## 2019-09-14 NOTE — Progress Notes (Signed)
[facility] Progress Note     Assessment/ Plan:   1. Shock- improved now on meripenem, vanc, and fluconazole.No pressors 2. AKI- (crt 1.22 on 07/24/19) following cardiac cath, cabg, cardiogenic shock, cardiac arrest due to tamponade, and acute on chronic CHF. Started CRRT on 08/24/19-5/12 due to extensive volume overload presenting chest closure.   1. Plan to maintain TTS schedule(but will add tx Fri as pt only had 20 min today) 2. Adjusting ultrafiltration as needed 2. LeftSCVtrialysis catheter placed by PCCM on 5/7-> will plan on HD Fri and then Sat. 3. VIR to place tunneled catheter on Mon; prefer to hold off on TC after event with bleeding in the chest today 4. Will also give Lokelma 5 x1. . 5. No UOP- Unclear if recovery is possible given major insult->bladder scan 5/17 (69m)(no foley). 3. CAD s/p CABG x 4 complicated by cardiac tamponade s/p reopened chest urgently on 08/18/19 with wound vac.most recent OR On 5/12 and then 5/20 (evacuation of blood in chest) 4. Acute systolic heart failure due to ischemic cardiomyopathy- volume markedly improved with CRRT. Will cont UF w/ HD since tolerating and no pressors 5. Postoperative acute hypoxic respiratory failure- reintubated 08/18/19 and likely aspiration. Now trach 6. Anemia- Hgbstable. transfuse prn.added ESA- darbe 150 7. Ileus-some improvement in distention. Per primary. 8. Elytes-phosphorus is improved with dialysis. No indication for binder at this time  Subjective:   OR today for emergent evacuation of blood in chest. Only 20 min of HD    Objective:   BP (!) 173/76   Pulse 95   Temp 97.9 F (36.6 C)   Resp 18   Ht 5' 9"  (1.753 m)   Wt 87.6 kg   SpO2 100%   BMI 28.52 kg/m   Intake/Output Summary (Last 24 hours) at 09/14/2019 1140 Last data filed at 09/14/2019 1033 Gross per 24 hour  Intake 2260.27 ml  Output 2188 ml  Net 72.27 ml   Weight change: -0.5 kg  Physical Exam: Gen: Alert on  vent via trach leftIJHD cath placed 5/7 CVS: Normal rate, open chest wound with wound vac in place Resp: cta, bilateral chest rise ABWL:SLHTdistention, +BS, soft, NT  Ext: 1+ edemaUEb/l GU: no foley Access: Lt SCV  temp (5/7)  Imaging: DG CHEST PORT 1 VIEW  Result Date: 09/14/2019 CLINICAL DATA:  Respiratory distress EXAM: PORTABLE CHEST 1 VIEW COMPARISON:  Sep 12, 2019 FINDINGS: Tracheostomy catheter tip is 7.2 cm above the carina. Feeding tube tip is below the diaphragm. Dual lumen catheter has its tip at the junction of the left innominate vein and superior vena cava. There is a catheter on the right with its tip in or overlying the medial right hemithorax, unchanged. No new tube or catheter evident compared to 2 days prior. No pneumothorax. There is a small left pleural effusion with atelectatic change in the left base. Stable nodular opacity in the periphery of the right mid lung measuring 1.4 x 1.3 cm. No new opacity evident. Heart is mildly enlarged with pulmonary vascularity normal. Postoperative changes noted. No appreciable adenopathy. No bone lesions. IMPRESSION: Tube and catheter positions as described. No pneumothorax. Persistent left pleural effusion with left base atelectasis. Nodular opacity persists in the periphery of the right lung. No new opacity evident. Stable cardiac silhouette. Postoperative changes noted. Electronically Signed   By: WLowella GripIII M.D.   On: 09/14/2019 08:28    Labs: BMET Recent Labs  Lab 09/08/19 0314 09/09/19 [id number] 0453 09/11/19 0208 09/12/19  0510 09/13/19 0531 09/14/19 0225  NA 137 137 138 138 139 135 133*  K 4.0 4.2 3.8 3.9 4.6 4.8 5.4*  CL 101 102 100 103 102 95* 96*  CO2 25 19* 24 21* 23 24 20*  GLUCOSE 175* 168* 121* 126* 170* 118* 196*  BUN 50* 90* 56* 85* 116* 62* 96*  CREATININE 3.62* 5.92* 4.65* 6.35* 7.83* 5.16* 6.83*  CALCIUM 8.1* 8.0* 8.2* 8.2* 8.5* 8.4* 8.3*  PHOS 5.3* 7.8* 5.6* 6.3* 7.5* 5.5* 7.5*    CBC Recent Labs  Lab 09/10/19 0453 09/13/19 0531 09/14/19 0225 09/14/19 0805  WBC 5.4 7.7 8.8 13.4*  NEUTROABS  --  5.2 5.8 8.9*  HGB 9.5* 8.7* 7.2* 6.1*  HCT 29.4* 27.9* 22.8* 20.6*  MCV 94.8 97.2 97.9 101.5*  PLT 222 309 362 393    Medications:    .  stroke: mapping our early stages of recovery book   Does not apply Once  . sodium chloride   Intravenous Once  . sodium chloride   Intravenous Once  . sodium chloride   Intravenous Once  . amiodarone  200 mg Per Tube BID  . arformoterol  15 mcg Nebulization BID  . aspirin  81 mg Per Tube Daily  . atorvastatin  40 mg Per Tube Daily  . B-complex with vitamin C  1 tablet Per Tube Daily  . bisacodyl  10 mg Oral Daily   Or  . bisacodyl  10 mg Rectal Daily  . chlorhexidine gluconate (MEDLINE KIT)  15 mL Mouth Rinse BID  . Chlorhexidine Gluconate Cloth  6 each Topical Q0600  . darbepoetin (ARANESP) injection - NON-DIALYSIS  150 mcg Subcutaneous Q Mon-1800  . docusate  100 mg Per Tube BID  . feeding supplement (PRO-STAT SUGAR FREE 64)  60 mL Per Tube TID  . heparin sodium (porcine)      . insulin aspart  0-24 Units Subcutaneous Q4H  . insulin detemir  10 Units Subcutaneous BID  . mouth rinse  15 mL Mouth Rinse 10 times per day  . midodrine  10 mg Oral TID WC  . pantoprazole sodium  40 mg Per Tube Daily  . revefenacin  175 mcg Nebulization Daily  . sertraline  25 mg Oral Daily  . sodium chloride flush  10-40 mL Intracatheter Q12H  . sodium chloride flush  10-40 mL Intracatheter Q12H  . trospium  20 mg Per Tube BID      Otelia Santee, MD 09/14/2019, 11:40 AM

## 2019-09-14 NOTE — Anesthesia Postprocedure Evaluation (Signed)
Anesthesia Post Note  Patient: Christian [name]  Procedure(s) Performed: EVACUATION OF HEMATOMA (N/A )     Patient location during evaluation: ICU Anesthesia Type: General Level of consciousness: sedated Pain management: pain level controlled Vital Signs Assessment: post-procedure vital signs reviewed and stable Respiratory status: spontaneous breathing, respiratory function stable and patient remains intubated per anesthesia plan Cardiovascular status: blood pressure returned to baseline Postop Assessment: no apparent nausea or vomiting Anesthetic complications: no               Brennan Bailey

## 2019-09-14 NOTE — Progress Notes (Signed)
Nutrition Follow-up  DOCUMENTATION CODES:   Not applicable  INTERVENTION:   Recommend long term feeding tube given prolonged admission and ongoing need for enteral nutrition.   Continue tube Feeding:  -Vital 1.5 at 50 ml/hr via post-pyloric Cortrak (1200 ml) -Pro-Stat 60 mL TID  Provides 2400 kcals, 171 g of protein and 917 mL of free water Meets 100% estimated needs  Transition to Renal MVI  NUTRITION DIAGNOSIS:   Increased nutrient needs related to post-op healing as evidenced by estimated needs.  Ongoing  GOAL:   Patient will meet greater than or equal to 90% of their needs  Addressed via TF  MONITOR:   Vent status, Skin, TF tolerance, Weight trends, I & O's, Labs  REASON FOR ASSESSMENT:   Ventilator    ASSESSMENT:   Patient with PMH significant for CAD s/p stenting, HTN, HLD, DM, and BPH. Presents this admission with RLL PE and for CABG.   4/14- admitted  4/20- CABG x 4, chest left open 4/22- chest closure, application of incisional wound vac 4/23- extubated, cardiac arrest, chest re-opened for washout, clot removed, re-intubated 4/26- return to OR for washout, cortrak placed, TF initiated 4/29- chest washout, application of wound VAC, CRRT initiated 5/5- chest washout, tracheostomy 5/10- chest washout, wound VAC 5/12- omental harvest, R pectoral muscle advancement flap, layered closure of sternal wound, wound VAC, stop CRRT 5/13- first HD  Pt with chest wall swelling/bleeding this am. Taken to OR for emergent mediastinal exploration and wound VAC application.   Continues on vent via trach. Only had 20 mins of HD this am. Tolerating tube feeding at goal rate per RN. Phosphorus continues to trend up. Binder added per nephrology.   Plan for d/c to LTAC. Pt would likely benefit from long term feeding tube.   Admission weight: 97 kg  Current weight: 87.6 kg   Patient requiring ventilator support via trach MV: 12.5 L/min Temp (24hrs), Avg:98.4 F (36.9  C), Min:97.5 F (36.4 C), Max:99.5 F (37.5 C)  I/O: -1,119 ml since 5/6 UOP: anuric  JP drain: 390 ml x 24 hrs  Rectal tube: 700 ml x 24 hrs   Medications: dulcoloax, aranesp, colace, SS novolog, levemir, lokelma Labs: Na 133 (L) K 5.3 (H) Phosphorus 7.5 (H) CBG 215-226  Diet Order:   Diet Order    None      EDUCATION NEEDS:   Not appropriate for education at this time  Skin:  Skin Assessment: Skin Integrity Issues: Skin Integrity Issues:: Stage II, Incisions, Wound VAC Stage II: upper lip Wound Vac: chest Incisions: chest, L arm, abdomen Other: n/a  Last BM:  5/19  Height:   Ht Readings from Last 1 Encounters:  08/28/19 5\' 9"  (1.753 m)    Weight:   Wt Readings from Last 1 Encounters:  09/14/19 87.6 kg   BMI:  Body mass index is 28.52 kg/m.  Estimated Nutritional Needs:   Kcal:  2125-2550  Protein:  170-195 grams  Fluid:  >/= 2 L/day   Mariana Single RD, LDN Clinical Nutrition Pager listed in [place]

## 2019-09-14 NOTE — Progress Notes (Signed)
NAME:  Christian [name], MRN:  [id number], DOB:  [birth year], LOS: 69 ADMISSION DATE:  08/09/2019, CONSULTATION DATE:  08/28/2019 REFERRING MD:  Orvan Seen - TRH, CHIEF COMPLAINT:  Respiratory failure.    HPI/course in hospital   74 year old man who presented with progressive dyspnea.  He was admitted 4/14 and was incidentally found to have a right lower lobe pulmonary embolism.  Overall the picture was that of decompensated heart failure with a non-STEMI.  Coronary angiography revealed triple-vessel disease and the patient was referred for cardiac surgery.  EF was found to be reduced at that time.  He underwent four-vessel CABG 4/20 with good quality distal targets.  Cardiac function remained reduced post bypass.  Unable to close chest due to surrounding edema.  Underwent attempted closure with VAC placement 4/22.   Cardiac arrest 4/23 with repeated washouts with VAC placements on 4/26 and 4/29.  Significant volume overload postoperatively.  Acute kidney injury.  Currently on CRRT for fluid removal.   Sig Events: 08/09/19 Admitted with NSTEM and ? Small RLL PE. Weight 213 pounds.  08/15/19 CABG x4 on 08/15/19 08/18/19 Cardiac arrest due to tamponade. Chest opened for washout 4/26 Chest washout in OR 4/29 CVVH started 5/5 OR for chest washout, tracheostomy 5/6 Trach aspirate with pan sensitive pseudomonas (completed course of abx  5/10 repeat washout, unable to close 5/12 to OR for chest closure with muscle flap  5/13 transitioned to [facility] 5/14 Neurology consulted for left sided weakness- CTH neg  Micro: 4/14 SARS/ Flu A/B >> neg 4/19 MRSA PCR >> neg 4/22 BCx 2 >> neg 5/2 trach asp >> normal flora  5/2 BCx2 >> neg 5/6 trach asp >> pan sensitive pseudomonas  5/6 BCx2 >> neg 5/10 sternal wound cx >> neg 5/14 BCx 2 >> ngtd  5/14 trach asp >> normal flora   ABX: Cefepime  4/23 >> 5/6 vanc 4/20 >> 4/25; 4/29 >>5/4; 5/6 >>5/13 Fluconazole 5/7 >>5/14 Meropenem 5/7 >> 5/15  Imaging: CTA  PE 4/14 >> small RLL PE Echo 4/24: EF 45% with Grade 2 DD [facility] 5/13 and 5/15 >> chronic microvascular ischemia, no acute intracranial abnormality   LDA: L [place] temporary HD line 5/7 >> R midline PIV 5/6 >> shiley trach 5/5 >> cortrak left nare >> Rectal tube Wound vac over sternum  Interim history/subjective:  Called to bedside emergently this morning for chest wall swelling. Anterior chest was protuberant and fluctuant to palpation.   Objective   Blood pressure (!) 107/59, pulse (!) 105, temperature 97.7 F (36.5 C), resp. rate (!) 23, height 5\' 9"  (1.753 m), weight 87.6 kg, SpO2 100 %.    Vent Mode: PRVC FiO2 (%):  [40 %] 40 % Set Rate:  [18 bmp] 18 bmp Vt Set:  [560 mL] 560 mL PEEP:  [5 cmH20] 5 cmH20 Pressure Support:  [12 cmH20] 12 cmH20   Intake/Output Summary (Last 24 hours) at 09/14/2019 0800 Last data filed at 09/14/2019 0700 Gross per 24 hour  Intake 795.27 ml  Output 1090 ml  Net -294.73 ml   Filed Weights   09/13/19 0500 09/14/19 0400 09/14/19 0656  Weight: 87.2 kg 86.9 kg 87.6 kg    Examination:  General:  Elderly male in bed on vent.  HEENT: MM pink/moist, #8 shiley trach.  Neuro: sedate this morning.  CV: RRR, no MRG. Anterior chest wall swelling  PULM:  Distant breath sounds  GI: soft, bs active,  Extremities: warm/dry, no LE edema  Skin: no rashes  Assessment & Plan:   Chronic hypoxemic respiratory failure requiring prolonged mechanical support postoperatively  COPD at baseline, without acute exacerbation former smoker - Restrictive chest physiology status post sternotomy, poor chest wall compliance - Baseline PFTs with mixed restrictive and obstructive defect, FEV1 50% predicted prior to surgery. - CXR 5/18 stable LLL atelectasis/ slightly better left effusion  Plan: Hold off SBT today as return to OR Continue LAMA/LABA, holding ICS with tracheostomy to avoid colonization PT/OT Trach care per protocol  VAP bundle  Volume removal per  iHD  HFrEF, ICM with resolved cardiogenic shock, EF 30-35% Post op aflutter s/p DCCV 4/22 currently in NSR CAD s/p CABG x 4 on 4/20.   Cardiac tamponade on 4/23 s/p open chest for washout and clot removal; chest closure 5/12 with muscle flap per plastic surgery Plan: - per cardiology / TCTS - on ASA, atorvastatin - midodrine for bp support - Amiodarone  Mediastinal hematoma: acutely progressive this morning.  - CVTS emergently to bedside and planning for urgent OR.   RLL pulmonary embolism noted mid April, afib - heparin (being held)  Left sided weakness - evaluated by neurology, CTH x 2 without definite CVA, unable to have MRI given sternal wires  Deconditioning related to prolonged ICU stay - Progressive mobility as able  Acute tubular necrosis now HD dependent, generalized anasarca, inability to close chest - iHD per Nephrology, goal TTS schedule  (last iHD 5/15) - planning for tunneled line placement 5/21 - Repeat BMP this afternoon as HD was interrupted.  - remains on midodrine 10 mg TID  Best practice:  Diet: TF at goal  Pain/Anxiety/Delirium protocol (if indicated): PRNs xanax/ dilaudid per primary  VAP protocol (if indicated): in place DVT prophylaxis: heparin gtt GI prophylaxis: PPI Glucose control: SSI, levemir Mobility: BR Code Status: full Family Communication: per primary Disposition: ICU   Georgann Housekeeper, AGACNP-BC [place] for personal pager PCCM on call pager [phone]  09/14/2019 8:00 AM

## 2019-09-14 NOTE — Progress Notes (Signed)
EVENING ROUNDS NOTE :     [address]       [place] [address]             [phone]                 Day of Surgery Procedure(s) (LRB): EVACUATION OF HEMATOMA (N/A)   Total Length of Stay:  LOS: 36 days  Events:  No events Extubated Good HD    BP 128/66   Pulse 99   Temp 98.5 F (36.9 C)   Resp 19   Ht 5\' 9"  (1.753 m)   Wt 87.6 kg   SpO2 100%   BMI 28.52 kg/m      Vent Mode: PRVC FiO2 (%):  [40 %] 40 % Set Rate:  [18 bmp] 18 bmp Vt Set:  [560 mL] 560 mL PEEP:  [5 cmH20] 5 cmH20 Pressure Support:  [12 cmH20] 12 cmH20 Plateau Pressure:  [19 cmH20-20 cmH20] 19 cmH20  . sodium chloride Stopped (09/14/19 0802)  . sodium chloride    . sodium chloride    . feeding supplement (VITAL 1.5 CAL) 1,000 mL (09/13/19 2131)  . lactated ringers 20 mL/hr at 08/20/19 1200  . vancomycin 200 mL/hr at 09/14/19 1100    I/O last 3 completed shifts: In: 1196 [I.V.:246; NG/GT:950] Out: 1200 [Drains:500; Stool:700]   CBC Latest Ref Rng & Units 09/14/2019 09/14/2019 09/14/2019  WBC 4.0 - 10.5 K/uL 11.9(H) 13.4(H) 8.8  Hemoglobin 13.0 - 17.0 g/dL 10.6(L) 6.1(LL) 7.2(L)  Hematocrit 39.0 - 52.0 % 32.4(L) 20.6(L) 22.8(L)  Platelets 150 - 400 K/uL 282 393 362    BMP Latest Ref Rng & Units 09/14/2019 09/13/2019 09/12/2019  Glucose 70 - 99 mg/dL 196(H) 118(H) 170(H)  BUN 8 - 23 mg/dL 96(H) 62(H) 116(H)  Creatinine 0.61 - 1.24 mg/dL 6.83(H) 5.16(H) 7.83(H)  BUN/Creat Ratio 10 - 24 - - -  Sodium 135 - 145 mmol/L 133(L) 135 139  Potassium 3.5 - 5.1 mmol/L 5.4(H) 4.8 4.6  Chloride 98 - 111 mmol/L 96(L) 95(L) 102  CO2 22 - 32 mmol/L 20(L) 24 23  Calcium 8.9 - 10.3 mg/dL 8.3(L) 8.4(L) 8.5(L)    ABG    Component Value Date/Time   PHART 7.481 (H) 09/07/2019 0029   PCO2ART 31.4 (L) 09/07/2019 0029   PO2ART 125 (H) 09/07/2019 0029   HCO3 23.3 09/07/2019 0029   TCO2 24 09/07/2019 0029   ACIDBASEDEF 5.0 (H) 08/18/2019 2341   O2SAT 99.0 09/07/2019 0029        Melodie Bouillon, MD 09/14/2019 5:44 PM

## 2019-09-14 NOTE — Progress Notes (Signed)
PT Cancellation Note  Patient Details Name: WAYDE GOPAUL MRN: [id number] DOB: May 02, 1945   Cancelled Treatment:    Reason Eval/Treat Not Completed: Patient not medically ready(pt returning to OR for hematoma and not currently appropriate)   Sharona Rovner B Yudith Norlander 09/14/2019, 8:24 AM [name], PT Acute Rehabilitation Services Pager: [phone] Office: [phone]

## 2019-09-15 ENCOUNTER — Inpatient Hospital Stay (HOSPITAL_COMMUNITY): Payer: Medicare Other

## 2019-09-15 ENCOUNTER — Other Ambulatory Visit (HOSPITAL_COMMUNITY): Payer: Medicare Other

## 2019-09-15 LAB — CBC WITH DIFFERENTIAL/PLATELET
Abs Immature Granulocytes: 0.12 10*3/uL — ABNORMAL HIGH (ref 0.00–0.07)
Basophils Absolute: 0.1 10*3/uL (ref 0.0–0.1)
Basophils Relative: 1 %
Eosinophils Absolute: 0.1 10*3/uL (ref 0.0–0.5)
Eosinophils Relative: 1 %
HCT: 27.2 % — ABNORMAL LOW (ref 39.0–52.0)
Hemoglobin: 9 g/dL — ABNORMAL LOW (ref 13.0–17.0)
Immature Granulocytes: 1 %
Lymphocytes Relative: 10 %
Lymphs Abs: 1 10*3/uL (ref 0.7–4.0)
MCH: 31.5 pg (ref 26.0–34.0)
MCHC: 33.1 g/dL (ref 30.0–36.0)
MCV: 95.1 fL (ref 80.0–100.0)
Monocytes Absolute: 1.6 10*3/uL — ABNORMAL HIGH (ref 0.1–1.0)
Monocytes Relative: 16 %
Neutro Abs: 6.9 10*3/uL (ref 1.7–7.7)
Neutrophils Relative %: 71 %
Platelets: 302 10*3/uL (ref 150–400)
RBC: 2.86 MIL/uL — ABNORMAL LOW (ref 4.22–5.81)
RDW: 17.5 % — ABNORMAL HIGH (ref 11.5–15.5)
WBC: 9.8 10*3/uL (ref 4.0–10.5)
nRBC: 0 % (ref 0.0–0.2)

## 2019-09-15 LAB — RENAL FUNCTION PANEL
Albumin: 1.8 g/dL — ABNORMAL LOW (ref 3.5–5.0)
Anion gap: 17 — ABNORMAL HIGH (ref 5–15)
BUN: 110 mg/dL — ABNORMAL HIGH (ref 8–23)
CO2: 24 mmol/L (ref 22–32)
Calcium: 7.9 mg/dL — ABNORMAL LOW (ref 8.9–10.3)
Chloride: 96 mmol/L — ABNORMAL LOW (ref 98–111)
Creatinine, Ser: 7.59 mg/dL — ABNORMAL HIGH (ref 0.61–1.24)
GFR calc Af Amer: 7 mL/min — ABNORMAL LOW (ref >60)
GFR calc non Af Amer: 6 mL/min — ABNORMAL LOW (ref >60)
Glucose, Bld: 249 mg/dL — ABNORMAL HIGH (ref 70–99)
Phosphorus: 9.2 mg/dL — ABNORMAL HIGH (ref 2.5–4.6)
Potassium: 4.4 mmol/L (ref 3.5–5.1)
Sodium: 137 mmol/L (ref 135–145)

## 2019-09-15 LAB — BASIC METABOLIC PANEL
Anion gap: 18 — ABNORMAL HIGH (ref 5–15)
BUN: 109 mg/dL — ABNORMAL HIGH (ref 8–23)
CO2: 21 mmol/L — ABNORMAL LOW (ref 22–32)
Calcium: 8.2 mg/dL — ABNORMAL LOW (ref 8.9–10.3)
Chloride: 97 mmol/L — ABNORMAL LOW (ref 98–111)
Creatinine, Ser: 7.46 mg/dL — ABNORMAL HIGH (ref 0.61–1.24)
GFR calc Af Amer: 8 mL/min — ABNORMAL LOW (ref >60)
GFR calc non Af Amer: 7 mL/min — ABNORMAL LOW (ref >60)
Glucose, Bld: 292 mg/dL — ABNORMAL HIGH (ref 70–99)
Potassium: 4.5 mmol/L (ref 3.5–5.1)
Sodium: 136 mmol/L (ref 135–145)

## 2019-09-15 LAB — MAGNESIUM: Magnesium: 2.3 mg/dL (ref 1.7–2.4)

## 2019-09-15 LAB — GLUCOSE, CAPILLARY
Glucose-Capillary: 159 mg/dL — ABNORMAL HIGH (ref 70–99)
Glucose-Capillary: 175 mg/dL — ABNORMAL HIGH (ref 70–99)
Glucose-Capillary: 200 mg/dL — ABNORMAL HIGH (ref 70–99)
Glucose-Capillary: 240 mg/dL — ABNORMAL HIGH (ref 70–99)
Glucose-Capillary: 241 mg/dL — ABNORMAL HIGH (ref 70–99)
Glucose-Capillary: 251 mg/dL — ABNORMAL HIGH (ref 70–99)

## 2019-09-15 MED ORDER — ALTEPLASE 2 MG IJ SOLR
2.0000 mg | Freq: Once | INTRAMUSCULAR | Status: AC
  Administered 2019-09-15: 2 mg

## 2019-09-15 MED ORDER — CLONAZEPAM 0.25 MG PO TBDP
0.5000 mg | ORAL_TABLET | Freq: Two times a day (BID) | ORAL | Status: DC
  Administered 2019-09-15 – 2019-10-13 (×44): 0.5 mg
  Filled 2019-09-15 (×2): qty 2
  Filled 2019-09-15 (×3): qty 1
  Filled 2019-09-15: qty 2
  Filled 2019-09-15: qty 1
  Filled 2019-09-15: qty 2
  Filled 2019-09-15: qty 1
  Filled 2019-09-15 (×5): qty 2
  Filled 2019-09-15: qty 1
  Filled 2019-09-15: qty 2
  Filled 2019-09-15 (×2): qty 1
  Filled 2019-09-15: qty 2
  Filled 2019-09-15: qty 1
  Filled 2019-09-15 (×3): qty 2
  Filled 2019-09-15 (×3): qty 1
  Filled 2019-09-15: qty 2
  Filled 2019-09-15 (×2): qty 1
  Filled 2019-09-15: qty 2
  Filled 2019-09-15: qty 1
  Filled 2019-09-15 (×4): qty 2
  Filled 2019-09-15: qty 1
  Filled 2019-09-15 (×2): qty 2
  Filled 2019-09-15 (×2): qty 1
  Filled 2019-09-15: qty 2
  Filled 2019-09-15: qty 1
  Filled 2019-09-15 (×2): qty 2
  Filled 2019-09-15 (×4): qty 1

## 2019-09-15 MED ORDER — CLONAZEPAM 0.5 MG PO TABS
0.5000 mg | ORAL_TABLET | Freq: Two times a day (BID) | ORAL | Status: DC
  Administered 2019-09-15: 0.5 mg via ORAL
  Filled 2019-09-15: qty 1

## 2019-09-15 MED ORDER — SERTRALINE HCL 25 MG PO TABS
25.0000 mg | ORAL_TABLET | Freq: Every day | ORAL | Status: DC
  Administered 2019-09-16 – 2019-10-13 (×22): 25 mg
  Filled 2019-09-15 (×25): qty 1

## 2019-09-15 MED ORDER — INSULIN DETEMIR 100 UNIT/ML ~~LOC~~ SOLN
15.0000 [IU] | Freq: Two times a day (BID) | SUBCUTANEOUS | Status: DC
  Administered 2019-09-15 – 2019-09-17 (×6): 15 [IU] via SUBCUTANEOUS
  Filled 2019-09-15 (×8): qty 0.15

## 2019-09-15 MED ORDER — SEVELAMER CARBONATE 0.8 G PO PACK
0.8000 g | PACK | Freq: Three times a day (TID) | ORAL | Status: DC
  Administered 2019-09-15 – 2019-10-01 (×39): 0.8 g
  Filled 2019-09-15 (×50): qty 1

## 2019-09-15 MED ORDER — MIDODRINE HCL 5 MG PO TABS
10.0000 mg | ORAL_TABLET | Freq: Three times a day (TID) | ORAL | Status: DC
  Administered 2019-09-15 – 2019-09-20 (×14): 10 mg
  Filled 2019-09-15 (×13): qty 2

## 2019-09-15 NOTE — Progress Notes (Signed)
PT Cancellation Note  Patient Details Name: Christian [name] MRN: [id number] DOB: 07/31/1945   Cancelled Treatment:    Reason Eval/Treat Not Completed: Other (comment)(pt just started on trach collar trial and not appropriate to exert for mobility at this time)   Cedar Roseman B Stone Spirito 09/15/2019, 12:09 PM  [name], PT Acute Rehabilitation Services Pager: [phone] Office: [phone]

## 2019-09-15 NOTE — Op Note (Signed)
NAME: Christian [name], Christian [name] MEDICAL RECORD EY:[id number] ACCOUNT [id number] DATE OF BIRTH:May 12, 1945 FACILITY: [facility] LOCATION: [facility] PHYSICIAN:[place] Jaquith BServando Snare, MD  OPERATIVE REPORT  DATE OF PROCEDURE:  09/14/2019  PREOPERATIVE DIAGNOSIS:  Sudden development of chest wall hematoma related to previous muscle flap closures of the sternum and heparin infusion.  POSTOPERATIVE DIAGNOSIS:  Sudden development of chest wall hematoma related to previous muscle flap closures of the sternum and heparin infusion.  SURGICAL PROCEDURES:   1.  Exploration of sternal incision. 2.  Evacuation of hematoma. 3.  Reclosure of wound.  CO-SURGEONS:  Lanelle Bal, MD and Dr. Marla Roe.  BRIEF HISTORY:  The patient is a 74 year old male with a complicated postoperative course after coronary artery bypass grafting, including renal failure and inability to close the sternum.  Ultimately, the patient 1 week previously had undergone omental  flap placement into the anterior mediastinum, plating of sternum for stabilization and a left pectoralis muscle flap.  Because of the patient's previous atrial fibrillation and question of a stroke involving the left arm and hand, the patient had been  restarted on a heparin infusion.  There was difficulty in maintaining proper levels of heparin.  On the morning of surgery, the patient had just been started on dialysis and suddenly had marked swelling of the entire chest wall.  The caregivers at that  time initially thought that this was significant subcutaneous air; however, on exam and with aspiration with a size 18-gauge needle, this was proven to be blood.  He was taken directly to the operating room on an emergency basis for evacuation of  hematoma.  DESCRIPTION OF PROCEDURE:  The patient was moved from the surgical intensive care unit directly to room 3.  He was sedated by anesthesia.  The anterior chest wall was prepped with Betadine, draped in a sterile manner.  He was  given a dose of vancomycin.   The midportion of the sternal incision was then opened after appropriate timeout was performed.  Overall, approximately [phone] mL of dark blood were evacuated from the subcutaneous pockets developed for muscle flaps.  The wound was carefully irrigated  and examined for any active arterial bleeding.  The omental flap closure with ACell placed over was well-incorporated and stable.  The wound was carefully examined further, especially at the insertion site of the right pectoralis muscle which had been  used based on the muscular flap.  With operative field hemostatic, two 19-Blake drains, 1 looped around the right, 1 looped around the left were placed under the flap closures.  The incisions were then closed with interrupted vertical absorbable suture  and the skin closed with a running 4-0 Monocryl suture.  An incisional wound VAC was placed.  The patient was given Cell Savers and packed cells.  He was returned to the surgical intensive care unit in stable condition.  Sponge and needle count was  reported as correct.  VN/NUANCE  D:09/15/2019 T:09/15/2019 JOB:[id number]

## 2019-09-15 NOTE — Progress Notes (Signed)
[facility] Progress Note     Assessment/ Plan:   1. Shock- had been  on meripenem, vanc, and fluconazole but now on vanc.No pressors 2. AKI- (crt 1.22 on 07/24/19) following cardiac cath, cabg, cardiogenic shock, cardiac arrest due to tamponade, and acute on chronic CHF. Started CRRT on 08/24/19-5/12 due to extensive volume overload preventing chest closure.   1. Plan to maintain TTS schedule(but will add tx today as pt only had 20 min 5/20). Seen on HD today -> decr goal from 2L net to 1.5L as tolerated 2. Adjusting ultrafiltration as needed 2. LeftSCVtrialysis catheter placed by PCCM on 5/7-> will plan on HD Fri and then Sat. 3. VIR to place tunneled catheter on Mon; prefer to hold off on TC after event with bleeding in the chest 5/20 4. No UOP- Unclear if recovery is possible given major insult->bladder scan 5/17 (13m)(no foley). 3. CAD s/p CABG x 4 complicated by cardiac tamponade s/p reopened chest urgently on 08/18/19 with wound vac.most recent OR On 5/12 and then 5/20 (evacuation of blood in chest) 4. Acute systolic heart failure due to ischemic cardiomyopathy- volume markedly improved with CRRT. Will cont UF w/ HD since tolerating and no pressors 5. Postoperative acute hypoxic respiratory failure- reintubated 08/18/19 and likely aspiration. Now trach 6. Anemia- Hgbstable. transfuse prn.added ESA- darbe 150 7. Ileus-some improvement in distention. Per primary. 8. Elytes-phosphorus is improved with dialysis. No indication for binder at this time  Subjective:   OR 5/20  for emergent evacuation of blood in chest. Only 20 min of HD on 5/20  Tolerating HD today No f/c/n/v   Objective:   BP 106/62 (BP Location: Right Leg)   Pulse (!) 107   Temp 98.6 F (37 C)   Resp 18   Ht 5' 9"  (1.753 m)   Wt 87 kg   SpO2 100%   BMI 28.32 kg/m   Intake/Output Summary (Last 24 hours) at 09/15/2019 [id number] data filed at 09/15/2019 0600 Gross per 24 hour   Intake 2809.24 ml  Output 2055 ml  Net 754.24 ml   Weight change: -0.6 kg  Physical Exam: Gen: Alert on vent via trach leftIJHD cath placed 5/7 CVS: Normal rate, open chest wound with wound vac in place Resp: cta, bilateral chest rise AZLD:JTTSdistention, +BS, soft, NT  Ext: 1+ edemaUEb/l GU: no foley Access: Lt SCV  temp (5/7)  Imaging: DG Chest Port 1 View  Result Date: 09/15/2019 CLINICAL DATA:  Respiratory failure EXAM: PORTABLE CHEST 1 VIEW COMPARISON:  Sep 14, 2019 FINDINGS: Tracheostomy catheter tip is 6.9 cm above the carina. Feeding tube tip is below the diaphragm. Central catheter tip is near the junction of the left innominate vein and superior vena cava, unchanged. A tube is either in or overlying the right hemithorax with the tip in the medial upper right hemithorax, stable. A new tube is either within or overlying the chest from the leftward approach with the tip just to the right of midline in the upper thoracic region. No pneumothorax evident. There is a left pleural effusion with left base atelectasis. The previously noted nodular opacity in the periphery of the right mid lung is stable. Heart remains mildly enlarged, stable, with pulmonary vascularity normal. No adenopathy. IMPRESSION: Tube and catheter positions as described without pneumothorax. Left pleural effusion with left base atelectasis persists. Stable nodular opacity right mid lung. No new opacity evident. Stable cardiac silhouette. Electronically Signed   By: WLowella GripIII M.D.   On:  09/15/2019 08:47   DG CHEST PORT 1 VIEW  Result Date: 09/14/2019 CLINICAL DATA:  Respiratory distress EXAM: PORTABLE CHEST 1 VIEW COMPARISON:  Sep 12, 2019 FINDINGS: Tracheostomy catheter tip is 7.2 cm above the carina. Feeding tube tip is below the diaphragm. Dual lumen catheter has its tip at the junction of the left innominate vein and superior vena cava. There is a catheter on the right with its tip in or overlying  the medial right hemithorax, unchanged. No new tube or catheter evident compared to 2 days prior. No pneumothorax. There is a small left pleural effusion with atelectatic change in the left base. Stable nodular opacity in the periphery of the right mid lung measuring 1.4 x 1.3 cm. No new opacity evident. Heart is mildly enlarged with pulmonary vascularity normal. Postoperative changes noted. No appreciable adenopathy. No bone lesions. IMPRESSION: Tube and catheter positions as described. No pneumothorax. Persistent left pleural effusion with left base atelectasis. Nodular opacity persists in the periphery of the right lung. No new opacity evident. Stable cardiac silhouette. Postoperative changes noted. Electronically Signed   By: Lowella Grip III M.D.   On: 09/14/2019 08:28    Labs: BMET Recent Labs  Lab 09/09/19 5462 09/09/19 7035 09/10/19 0453 09/10/19 0453 09/11/19 0208 09/12/19 0510 09/13/19 0531 09/14/19 0225 09/14/19 1800 09/15/19 0009 09/15/19 0329  NA 137   < > 138   < > 138 139 135 133* 133* 136 137  K 4.2   < > 3.8   < > 3.9 4.6 4.8 5.4* 5.6* 4.5 4.4  CL 102   < > 100   < > 103 102 95* 96* 97* 97* 96*  CO2 19*   < > 24   < > 21* 23 24 20* 22 21* 24  GLUCOSE 168*   < > 121*   < > 126* 170* 118* 196* 265* 292* 249*  BUN 90*   < > 56*   < > 85* 116* 62* 96* 97* 109* 110*  CREATININE 5.92*   < > 4.65*   < > 6.35* 7.83* 5.16* 6.83* 6.78* 7.46* 7.59*  CALCIUM 8.0*   < > 8.2*   < > 8.2* 8.5* 8.4* 8.3* 7.8* 8.2* 7.9*  PHOS 7.8*  --  5.6*  --  6.3* 7.5* 5.5* 7.5*  --   --  9.2*   < > = values in this interval not displayed.   CBC Recent Labs  Lab 09/13/19 0531 09/13/19 0531 09/14/19 0225 09/14/19 0805 09/14/19 1425 09/15/19 0329  WBC 7.7   < > 8.8 13.4* 11.9* 9.8  NEUTROABS 5.2  --  5.8 8.9*  --  6.9  HGB 8.7*   < > 7.2* 6.1* 10.6* 9.0*  HCT 27.9*   < > 22.8* 20.6* 32.4* 27.2*  MCV 97.2   < > 97.9 101.5* 94.5 95.1  PLT 309   < > 362 393 282 302   < > = values in this  interval not displayed.    Medications:    .  stroke: mapping our early stages of recovery book   Does not apply Once  . sodium chloride   Intravenous Once  . sodium chloride   Intravenous Once  . sodium chloride   Intravenous Once  . amiodarone  200 mg Per Tube BID  . arformoterol  15 mcg Nebulization BID  . aspirin  81 mg Per Tube Daily  . atorvastatin  40 mg Per Tube Daily  . bisacodyl  10 mg Oral  Daily   Or  . bisacodyl  10 mg Rectal Daily  . chlorhexidine gluconate (MEDLINE KIT)  15 mL Mouth Rinse BID  . Chlorhexidine Gluconate Cloth  6 each Topical Q0600  . darbepoetin (ARANESP) injection - NON-DIALYSIS  150 mcg Subcutaneous Q Mon-1800  . docusate  100 mg Per Tube BID  . feeding supplement (PRO-STAT SUGAR FREE 64)  60 mL Per Tube TID  . insulin aspart  0-24 Units Subcutaneous Q4H  . insulin detemir  15 Units Subcutaneous BID  . mouth rinse  15 mL Mouth Rinse 10 times per day  . midodrine  10 mg Oral TID WC  . multivitamin  1 tablet Oral QHS  . pantoprazole sodium  40 mg Per Tube Daily  . revefenacin  175 mcg Nebulization Daily  . sertraline  25 mg Oral Daily  . sevelamer carbonate  0.8 g Oral TID WC  . sodium chloride flush  10-40 mL Intracatheter Q12H  . sodium chloride flush  10-40 mL Intracatheter Q12H  . trospium  20 mg Per Tube BID      Otelia Santee, MD 09/15/2019, 9:07 AM

## 2019-09-15 NOTE — Progress Notes (Signed)
Pt placed back on full support for night time rest. Pt respiratory status is stable at this time. RT will continue to monitor.  

## 2019-09-15 NOTE — Plan of Care (Signed)
  Problem: Clinical Measurements: Goal: Ability to maintain clinical measurements within normal limits will improve Outcome: Progressing Goal: Respiratory complications will improve Outcome: Progressing Goal: Cardiovascular complication will be avoided Outcome: Progressing   Problem: Nutrition: Goal: Adequate nutrition will be maintained Outcome: Progressing   Problem: Pain Managment: Goal: General experience of comfort will improve Outcome: Progressing

## 2019-09-15 NOTE — Progress Notes (Signed)
NAME:  Christian [name], MRN:  [id number], DOB:  [birth year], LOS: 57 ADMISSION DATE:  08/09/2019, CONSULTATION DATE:  08/28/2019 REFERRING MD:  Orvan Seen - TRH, CHIEF COMPLAINT:  Respiratory failure.    HPI/course in hospital   74 year old man who presented with progressive dyspnea.  He was admitted 4/14 and was incidentally found to have a right lower lobe pulmonary embolism.  Overall the picture was that of decompensated heart failure with a non-STEMI.  Coronary angiography revealed triple-vessel disease and the patient was referred for cardiac surgery.  EF was found to be reduced at that time.  He underwent four-vessel CABG 4/20 with good quality distal targets.  Cardiac function remained reduced post bypass.  Unable to close chest due to surrounding edema.  Underwent attempted closure with VAC placement 4/22.   Cardiac arrest 4/23 with repeated washouts with VAC placements on 4/26 and 4/29.  Significant volume overload postoperatively.  Acute kidney injury.  Currently on CRRT for fluid removal.   Sig Events: 08/09/19 Admitted with NSTEM and ? Small RLL PE. Weight 213 pounds.  08/15/19 CABG x4 on 08/15/19 08/18/19 Cardiac arrest due to tamponade. Chest opened for washout 4/26 Chest washout in OR 4/29 CVVH started 5/5 OR for chest washout, tracheostomy 5/6 Trach aspirate with pan sensitive pseudomonas (completed course of abx  5/10 repeat washout, unable to close 5/12 to OR for chest closure with muscle flap  5/13 transitioned to [facility] 5/14 Neurology consulted for left sided weakness- CTH neg 5/20 chest wall swelling > taken to OR for evacuation of hematoma and re-closure.   Micro: 4/14 SARS/ Flu A/B >> neg 4/19 MRSA PCR >> neg 4/22 BCx 2 >> neg 5/2 trach asp >> normal flora  5/2 BCx2 >> neg 5/6 trach asp >> pan sensitive pseudomonas  5/6 BCx2 >> neg 5/10 sternal wound cx >> neg 5/14 BCx 2 >> ngtd  5/14 trach asp >> normal flora   ABX: Cefepime  4/23 >> 5/6 vanc 4/20 >> 4/25;  4/29 >>5/4; 5/6 >>5/13 Fluconazole 5/7 >>5/14 Meropenem 5/7 >> 5/15  Imaging: CTA PE 4/14 >> small RLL PE Echo 4/24: EF 45% with Grade 2 DD [facility] 5/13 and 5/15 >> chronic microvascular ischemia, no acute intracranial abnormality   LDA: L [place] temporary HD line 5/7 >> R midline PIV 5/6 >> shiley trach 5/5 >> cortrak left nare >> Rectal tube >> Wound vac over sternum >>  Interim history/subjective:  Complaining of some anxiety this morning  Objective   Blood pressure 103/60, pulse (!) 108, temperature 98.6 F (37 C), resp. rate 18, height 5\' 9"  (1.753 m), weight 87 kg, SpO2 100 %.    Vent Mode: CPAP;PSV FiO2 (%):  [30 %-40 %] 30 % Set Rate:  [18 bmp] 18 bmp Vt Set:  [560 mL] 560 mL PEEP:  [5 cmH20] 5 cmH20 Pressure Support:  [5 cmH20] 5 cmH20 Plateau Pressure:  [18 cmH20-21 cmH20] 19 cmH20   Intake/Output Summary (Last 24 hours) at 09/15/2019 5009 Last data filed at 09/15/2019 0600 Gross per 24 hour  Intake 2809.24 ml  Output 2055 ml  Net 754.24 ml   Filed Weights   09/14/19 0656 09/15/19 0500 09/15/19 0640  Weight: 87.6 kg 86.3 kg 87 kg    Examination:  General:  Elderly male in bed on vent with large longitudinal wound vac over sternum.   HEENT: MM pink/moist, #8 shiley trach.  Neuro: sedate this morning.  CV: RRR, no MRG. Anterior chest wall swelling  PULM:  Distant breath sounds  GI: soft, bs active,  Extremities: warm/dry, no LE edema  Skin: no rashes   Assessment & Plan:   Chronic hypoxemic respiratory failure requiring prolonged mechanical support postoperatively  COPD at baseline, without acute exacerbation former smoker - Restrictive chest physiology status post sternotomy, poor chest wall compliance - Baseline PFTs with mixed restrictive and obstructive defect, FEV1 50% predicted prior to surgery. - CXR 5/18 stable LLL atelectasis/ slightly better left effusion  Plan: Continuing SBT today. Tolerating but rate up somewhat denies pain.  Continue  LAMA/LABA, holding ICS with tracheostomy to avoid colonization PT/OT Trach care per protocol  VAP bundle HD for volume removal PRN ativan for ongoing anxiety.   HFrEF, ICM with resolved cardiogenic shock, EF 30-35% Post op aflutter s/p DCCV 4/22 currently in NSR CAD s/p CABG x 4 on 4/20.   Cardiac tamponade on 4/23 s/p open chest for washout and clot removal; chest closure 5/12 with muscle flap per plastic surgery Plan: - per cardiology / TCTS - on ASA, atorvastatin - midodrine for bp support - Amiodarone  Mediastinal hematoma: acutely progressive this morning.  - s/p OR 5/21. Sternal wound vac in place.    RLL pulmonary embolism noted mid April, afib - heparin (being held)  Left sided weakness - evaluated by neurology, CTH x 2 without definite CVA, unable to have MRI given sternal wires  DM: has continued to be hyperglycemic SSI Increase levemir today  Deconditioning related to prolonged ICU stay - Progressive mobility as able  Acute tubular necrosis now HD dependent, generalized anasarca, inability to close chest - iHD per Nephrology, goal TTS schedule  (last iHD 5/15) - planning for tunneled line placement 5/21 - Repeat BMP this afternoon as HD was interrupted.  - remains on midodrine 10 mg TID  Best practice:  Diet: TF at goal  Pain/Anxiety/Delirium protocol (if indicated): PRNs xanax/ dilaudid per primary  VAP protocol (if indicated): in place DVT prophylaxis: heparin gtt GI prophylaxis: PPI Glucose control: SSI, levemir Mobility: BR Code Status: full Family Communication: per primary Disposition: ICU   Georgann Housekeeper, AGACNP-BC [place] for personal pager PCCM on call pager [phone]  09/15/2019 8:23 AM

## 2019-09-15 NOTE — Progress Notes (Signed)
1 Day Post-Op  Subjective: Patient is a 74 year old male who underwent evacuation of a hematoma from his chest yesterday. No overnight events. Patient is alert and awake, trach in place. Ioban and dressing in place; flat against chest. Drains in place with serosanguinous fluid in bulbs.   Objective: Vital signs in last 24 hours: Temp:  [97.5 F (36.4 C)-99.8 F (37.7 C)] 98.7 F (37.1 C) (05/21 0640) Pulse Rate:  [94-121] 102 (05/21 0715) Resp:  [15-32] 18 (05/21 0700) BP: (98-173)/(49-76) 128/66 (05/21 0715) SpO2:  [99 %-100 %] 100 % (05/21 0700) Arterial Line BP: (112-163)/(44-58) 135/54 (05/21 0700) FiO2 (%):  [30 %-40 %] 30 % (05/21 0400) Weight:  [86.3 kg-87 kg] 87 kg (05/21 0640) Last BM Date: (Flexiseal)  Intake/Output from previous day: 05/20 0701 - 05/21 0700 In: 2809.2 [I.V.:439.3; Blood:1130; NG/GT:900; IV Piggyback:300] Out: 2153 [Drains:155; Stool:900; Blood:1000] Intake/Output this shift: No intake/output data recorded.  General appearance: alert and trach in place Head: Normocephalic, without obvious abnormality, atraumatic Eyes: EOMS intact Chest wall: Ioban and dressing in place; flat agaist chest wall. Drains in place with serosanguinous fluid in bulbs  Lab Results:  CBC    Component Value Date/Time   WBC 9.8 09/15/2019 0329   RBC 2.86 (L) 09/15/2019 0329   HGB 9.0 (L) 09/15/2019 0329   HCT 27.2 (L) 09/15/2019 0329   PLT 302 09/15/2019 0329   MCV 95.1 09/15/2019 0329   MCH 31.5 09/15/2019 0329   MCHC 33.1 09/15/2019 0329   RDW 17.5 (H) 09/15/2019 0329   LYMPHSABS 1.0 09/15/2019 0329   MONOABS 1.6 (H) 09/15/2019 0329   EOSABS 0.1 09/15/2019 0329   BASOSABS 0.1 09/15/2019 0329   BMET Recent Labs    09/15/19 0009 09/15/19 0329  NA 136 137  K 4.5 4.4  CL 97* 96*  CO2 21* 24  GLUCOSE 292* 249*  BUN 109* 110*  CREATININE 7.46* 7.59*  CALCIUM 8.2* 7.9*   PT/INR Recent Labs    09/14/19 0805  LABPROT 15.0  INR 1.2   ABG No results for  input(s): PHART, HCO3 in the last 72 hours.  Invalid input(s): PCO2, PO2  Studies/Results: DG CHEST PORT 1 VIEW  Result Date: 09/14/2019 CLINICAL DATA:  Respiratory distress EXAM: PORTABLE CHEST 1 VIEW COMPARISON:  Sep 12, 2019 FINDINGS: Tracheostomy catheter tip is 7.2 cm above the carina. Feeding tube tip is below the diaphragm. Dual lumen catheter has its tip at the junction of the left innominate vein and superior vena cava. There is a catheter on the right with its tip in or overlying the medial right hemithorax, unchanged. No new tube or catheter evident compared to 2 days prior. No pneumothorax. There is a small left pleural effusion with atelectatic change in the left base. Stable nodular opacity in the periphery of the right mid lung measuring 1.4 x 1.3 cm. No new opacity evident. Heart is mildly enlarged with pulmonary vascularity normal. Postoperative changes noted. No appreciable adenopathy. No bone lesions. IMPRESSION: Tube and catheter positions as described. No pneumothorax. Persistent left pleural effusion with left base atelectasis. Nodular opacity persists in the periphery of the right lung. No new opacity evident. Stable cardiac silhouette. Postoperative changes noted. Electronically Signed   By: Lowella Grip III M.D.   On: 09/14/2019 08:28    Anti-infectives: Anti-infectives (From admission, onward)   Start     Dose/Rate Route Frequency Ordered Stop   09/14/19 0915  vancomycin (VANCOCIN) IVPB 1000 mg/200 mL premix     1,000 mg  200 mL/hr over 60 Minutes Intravenous To Surgery 09/14/19 0907 09/15/19 0915   09/07/19 2000  vancomycin (VANCOREADY) IVPB 750 mg/150 mL     750 mg 150 mL/hr over 60 Minutes Intravenous  Once 09/07/19 1510 09/07/19 2245   09/06/19 2200  meropenem (MERREM) 500 mg in sodium chloride 0.9 % 100 mL IVPB     500 mg 200 mL/hr over 30 Minutes Intravenous Every 24 hours 09/06/19 1542 09/09/19 0015   09/06/19 2000  fluconazole (DIFLUCAN) IVPB 400 mg      400 mg 100 mL/hr over 120 Minutes Intravenous Every 24 hours 09/06/19 1542 09/08/19 2240   09/06/19 1800  vancomycin (VANCOREADY) IVPB 750 mg/150 mL     750 mg 150 mL/hr over 60 Minutes Intravenous  Once 09/06/19 1542 09/07/19 0022   09/06/19 1540  vancomycin variable dose per unstable renal function (pharmacist dosing)      Does not apply See admin instructions 09/06/19 1542 09/08/19 2359   09/06/19 1100  ceFAZolin (ANCEF) IVPB 2g/100 mL premix  Status:  Discontinued     2 g 200 mL/hr over 30 Minutes Intravenous 30 min pre-op 09/05/19 1717 09/06/19 1644   09/06/19 0600  ceFAZolin (ANCEF) IVPB 2g/100 mL premix  Status:  Discontinued     2 g 200 mL/hr over 30 Minutes Intravenous To Short Stay 09/06/19 0546 09/06/19 1644   09/02/19 2200  meropenem (MERREM) 1 g in sodium chloride 0.9 % 100 mL IVPB  Status:  Discontinued     1 g 200 mL/hr over 30 Minutes Intravenous Every 8 hours 09/02/19 1011 09/06/19 1542   09/02/19 2000  fluconazole (DIFLUCAN) IVPB 800 mg  Status:  Discontinued     800 mg 100 mL/hr over 240 Minutes Intravenous Every 24 hours 09/02/19 1011 09/06/19 1542   09/02/19 2000  fluconazole (DIFLUCAN) IVPB 800 mg  Status:  Discontinued     800 mg 200 mL/hr over 120 Minutes Intravenous Every 24 hours 09/02/19 1329 09/02/19 1330   09/02/19 1800  vancomycin (VANCOCIN) IVPB 1000 mg/200 mL premix  Status:  Discontinued     1,000 mg 200 mL/hr over 60 Minutes Intravenous Every 24 hours 09/02/19 1011 09/06/19 1542   09/02/19 0900  meropenem (MERREM) 1 g in sodium chloride 0.9 % 100 mL IVPB  Status:  Discontinued     1 g 200 mL/hr over 30 Minutes Intravenous Every 24 hours 09/01/19 1333 09/02/19 1011   09/01/19 1700  fluconazole (DIFLUCAN) IVPB 800 mg     800 mg 100 mL/hr over 240 Minutes Intravenous  Once 09/01/19 1645 09/01/19 2057   09/01/19 1630  fluconazole (DIFLUCAN) IVPB 400 mg  Status:  Discontinued     400 mg 100 mL/hr over 120 Minutes Intravenous  Once 09/01/19 1629 09/01/19  1645   09/01/19 1330  vancomycin variable dose per unstable renal function (pharmacist dosing)  Status:  Discontinued      Does not apply See admin instructions 09/01/19 1333 09/02/19 1325   09/01/19 0730  meropenem (MERREM) 1 g in sodium chloride 0.9 % 100 mL IVPB  Status:  Discontinued     1 g 200 mL/hr over 30 Minutes Intravenous Every 8 hours 09/01/19 0729 09/01/19 1327   08/31/19 2200  vancomycin (VANCOCIN) IVPB 1000 mg/200 mL premix  Status:  Discontinued     1,000 mg 200 mL/hr over 60 Minutes Intravenous Every 24 hours 08/30/19 2235 09/01/19 1330   08/25/19 0800  vancomycin (VANCOREADY) IVPB 1250 mg/250 mL  Status:  Discontinued  1,250 mg 166.7 mL/hr over 90 Minutes Intravenous Every 24 hours 08/24/19 2121 08/24/19 2128   08/25/19 0100  vancomycin (VANCOREADY) IVPB 1250 mg/250 mL  Status:  Discontinued     1,250 mg 166.7 mL/hr over 90 Minutes Intravenous Every 24 hours 08/24/19 2128 08/30/19 2235   08/24/19 2300  ceFEPIme (MAXIPIME) 2 g in sodium chloride 0.9 % 100 mL IVPB  Status:  Discontinued     2 g 200 mL/hr over 30 Minutes Intravenous Every 12 hours 08/24/19 2150 09/01/19 0729   08/24/19 1338  vancomycin (VANCOCIN) 1,000 mg in sodium chloride 0.9 % 1,000 mL irrigation  Status:  Discontinued       As needed 08/24/19 1338 08/24/19 1508   08/21/19 0000  ceFEPIme (MAXIPIME) 1 g in sodium chloride 0.9 % 100 mL IVPB  Status:  Discontinued     1 g 200 mL/hr over 30 Minutes Intravenous Every 24 hours 08/20/19 0954 08/24/19 2150   08/20/19 1200  vancomycin (VANCOCIN) IVPB 1000 mg/200 mL premix     1,000 mg 200 mL/hr over 60 Minutes Intravenous  Once 08/20/19 0954 08/21/19 0011   08/19/19 2129  vancomycin variable dose per unstable renal function (pharmacist dosing)  Status:  Discontinued      Does not apply See admin instructions 08/19/19 2129 08/24/19 2128   08/19/19 0000  ceFEPIme (MAXIPIME) 2 g in sodium chloride 0.9 % 100 mL IVPB  Status:  Discontinued     2 g 200 mL/hr over  30 Minutes Intravenous Every 24 hours 08/18/19 2351 08/20/19 0954   08/19/19 0000  vancomycin (VANCOREADY) IVPB 2000 mg/400 mL     2,000 mg 200 mL/hr over 120 Minutes Intravenous  Once 08/18/19 2351 08/19/19 0354   08/16/19 1731  vancomycin (VANCOCIN) powder  Status:  Discontinued       As needed 08/16/19 1732 08/16/19 1808   08/15/19 2045  vancomycin (VANCOCIN) IVPB 1000 mg/200 mL premix     1,000 mg 200 mL/hr over 60 Minutes Intravenous  Once 08/15/19 1402 08/15/19 2209   08/15/19 1645  cefUROXime (ZINACEF) 1.5 g in sodium chloride 0.9 % 100 mL IVPB     1.5 g 200 mL/hr over 30 Minutes Intravenous Every 12 hours 08/15/19 1402 08/17/19 0700   08/15/19 0958  vancomycin (VANCOCIN) powder  Status:  Discontinued       As needed 08/15/19 1000 08/15/19 1402   08/15/19 0400  vancomycin (VANCOREADY) IVPB 1500 mg/300 mL     1,500 mg 150 mL/hr over 120 Minutes Intravenous To Surgery 08/14/19 1121 08/15/19 0830   08/15/19 0400  cefUROXime (ZINACEF) 1.5 g in sodium chloride 0.9 % 100 mL IVPB     1.5 g 200 mL/hr over 30 Minutes Intravenous To Surgery 08/14/19 1121 08/15/19 0812   08/15/19 0400  cefUROXime (ZINACEF) 750 mg in sodium chloride 0.9 % 100 mL IVPB     750 mg 200 mL/hr over 30 Minutes Intravenous To Surgery 08/14/19 1121 08/15/19 1300      Assessment/Plan: s/p Procedure(s): EVACUATION OF HEMATOMA Dressing in place, flat against chest wall. No signs of recurring hematoma. Drains in place with serosanguinous fluid in bulbs.  Will continue to follow and remain available as needed.  LOS: 37 days    Threasa Heads, [place] 09/15/2019

## 2019-09-15 NOTE — Progress Notes (Signed)
PT Cancellation Note  Patient Details Name: CHASON MCIVER MRN: [id number] DOB: April 24, 1946   Cancelled Treatment:    Reason Eval/Treat Not Completed: Patient at procedure or test/unavailable(HD)   Johanne Mcglade B Tarrin Lebow 09/15/2019, 8:05 AM  [name], PT Acute Rehabilitation Services Pager: [phone] Office: [phone]

## 2019-09-16 LAB — CBC WITH DIFFERENTIAL/PLATELET
Abs Immature Granulocytes: 0.13 10*3/uL — ABNORMAL HIGH (ref 0.00–0.07)
Basophils Absolute: 0.1 10*3/uL (ref 0.0–0.1)
Basophils Relative: 1 %
Eosinophils Absolute: 0.5 10*3/uL (ref 0.0–0.5)
Eosinophils Relative: 5 %
HCT: 25.3 % — ABNORMAL LOW (ref 39.0–52.0)
Hemoglobin: 8.1 g/dL — ABNORMAL LOW (ref 13.0–17.0)
Immature Granulocytes: 1 %
Lymphocytes Relative: 10 %
Lymphs Abs: 1 10*3/uL (ref 0.7–4.0)
MCH: 31.2 pg (ref 26.0–34.0)
MCHC: 32 g/dL (ref 30.0–36.0)
MCV: 97.3 fL (ref 80.0–100.0)
Monocytes Absolute: 1.7 10*3/uL — ABNORMAL HIGH (ref 0.1–1.0)
Monocytes Relative: 17 %
Neutro Abs: 6.7 10*3/uL (ref 1.7–7.7)
Neutrophils Relative %: 66 %
Platelets: 318 10*3/uL (ref 150–400)
RBC: 2.6 MIL/uL — ABNORMAL LOW (ref 4.22–5.81)
RDW: 17.4 % — ABNORMAL HIGH (ref 11.5–15.5)
WBC: 10.1 10*3/uL (ref 4.0–10.5)
nRBC: 0 % (ref 0.0–0.2)

## 2019-09-16 LAB — GLUCOSE, CAPILLARY
Glucose-Capillary: 112 mg/dL — ABNORMAL HIGH (ref 70–99)
Glucose-Capillary: 150 mg/dL — ABNORMAL HIGH (ref 70–99)
Glucose-Capillary: 160 mg/dL — ABNORMAL HIGH (ref 70–99)
Glucose-Capillary: 172 mg/dL — ABNORMAL HIGH (ref 70–99)
Glucose-Capillary: 180 mg/dL — ABNORMAL HIGH (ref 70–99)
Glucose-Capillary: 191 mg/dL — ABNORMAL HIGH (ref 70–99)

## 2019-09-16 LAB — RENAL FUNCTION PANEL
Albumin: 1.9 g/dL — ABNORMAL LOW (ref 3.5–5.0)
Anion gap: 15 (ref 5–15)
BUN: 82 mg/dL — ABNORMAL HIGH (ref 8–23)
CO2: 26 mmol/L (ref 22–32)
Calcium: 8 mg/dL — ABNORMAL LOW (ref 8.9–10.3)
Chloride: 94 mmol/L — ABNORMAL LOW (ref 98–111)
Creatinine, Ser: 5.38 mg/dL — ABNORMAL HIGH (ref 0.61–1.24)
GFR calc Af Amer: 11 mL/min — ABNORMAL LOW (ref >60)
GFR calc non Af Amer: 10 mL/min — ABNORMAL LOW (ref >60)
Glucose, Bld: 191 mg/dL — ABNORMAL HIGH (ref 70–99)
Phosphorus: 5.7 mg/dL — ABNORMAL HIGH (ref 2.5–4.6)
Potassium: 3.9 mmol/L (ref 3.5–5.1)
Sodium: 135 mmol/L (ref 135–145)

## 2019-09-16 LAB — MAGNESIUM: Magnesium: 2.2 mg/dL (ref 1.7–2.4)

## 2019-09-16 MED ORDER — ALTEPLASE 2 MG IJ SOLR
2.0000 mg | Freq: Once | INTRAMUSCULAR | Status: DC
  Filled 2019-09-16 (×2): qty 2

## 2019-09-16 NOTE — Progress Notes (Signed)
2 Days Post-Op Procedure(s) (LRB): EVACUATION OF HEMATOMA (N/A) Subjective: No complaints  Objective: Vital signs in last 24 hours: Temp:  [97.9 F (36.6 C)-98.8 F (37.1 C)] 98.5 F (36.9 C) (05/22 0758) Pulse Rate:  [93-108] 95 (05/22 0900) Cardiac Rhythm: Normal sinus rhythm (05/22 0800) Resp:  [0-35] 17 (05/22 0900) BP: (105-139)/(48-65) 124/55 (05/22 0900) SpO2:  [95 %-100 %] 99 % (05/22 0900) Arterial Line BP: (100-144)/(40-89) 119/50 (05/21 2000) FiO2 (%):  [28 %-30 %] 28 % (05/22 0830) Weight:  [85.6 kg] 85.6 kg (05/22 0500)  Hemodynamic parameters for last 24 hours:    Intake/Output from previous day: 05/21 0701 - 05/22 0700 In: 1520 [NG/GT:1520] Out: 1904 [Drains:70; Stool:500] Intake/Output this shift: Total I/O In: 350 [NG/GT:350] Out: -   General appearance: alert and cooperative Neurologic: intact Heart: regular rate and rhythm, S1, S2 normal, no murmur, click, rub or gallop Lungs: clear to auscultation bilaterally Abdomen: soft, non-tender; bowel sounds normal; no masses,  no organomegaly Extremities: extremities normal, atraumatic, no cyanosis or edema Wound: dressed  Lab Results: Recent Labs    09/15/19 0329 09/16/19 0438  WBC 9.8 10.1  HGB 9.0* 8.1*  HCT 27.2* 25.3*  PLT 302 318   BMET:  Recent Labs    09/15/19 0329 09/16/19 0438  NA 137 135  K 4.4 3.9  CL 96* 94*  CO2 24 26  GLUCOSE 249* 191*  BUN 110* 82*  CREATININE 7.59* 5.38*  CALCIUM 7.9* 8.0*    PT/INR:  Recent Labs    09/14/19 0805  LABPROT 15.0  INR 1.2   ABG    Component Value Date/Time   PHART 7.481 (H) 09/07/2019 0029   HCO3 23.3 09/07/2019 0029   TCO2 24 09/07/2019 0029   ACIDBASEDEF 5.0 (H) 08/18/2019 2341   O2SAT 99.0 09/07/2019 0029   CBG (last 3)  Recent Labs    09/15/19 2338 09/16/19 0434 09/16/19 0803  GLUCAP 200* 172* 160*    Assessment/Plan: S/P Procedure(s) (LRB): EVACUATION OF HEMATOMA (N/A) Mobilize vent wean   LOS: 38 days     [name] 09/16/2019

## 2019-09-16 NOTE — Progress Notes (Signed)
[name] Progress Note     Assessment/ Plan:   1. Shock- had been  on meripenem, vanc, and fluconazole but now on vanc.No pressors 2. AKI- (crt 1.22 on 07/24/19) following cardiac cath, cabg, cardiogenic shock, cardiac arrest due to tamponade, and acute on chronic CHF. Started CRRT on 08/24/19-5/12 due to extensive volume overload preventing chest closure.  1. Plan to maintain TTS schedule; last  HD 5/21 with net UF 137m. On for HD today as well and then will maintain TTS regimen. 2. LeftSCVtrialysis catheter placed by PCCM on 5/7-> will HD today and then request VIR to place RIJ tunneled catheter onMon. 3. No UOP- Unclear if recovery is possible given major insult->bladder scan 5/17 (525m(no foley). 3. CAD s/p CABG x 4 complicated by cardiac tamponade s/p reopened chest urgently on 08/18/19 with wound vac.most recent OR On 5/12and then 5/20 (evacuation of blood in chest) 4. Acute systolic heart failure due to ischemic cardiomyopathy- volume markedly improved with CRRT. Will cont UF w/ HD since tolerating and no pressors 5. Postoperative acute hypoxic respiratory failure- reintubated 08/18/19 and likely aspiration. Now trach 6. Anemia- Hgbstable. transfuse prn.added ESA- darbe 150 7. Ileus-some improvement in distention. Per primary. 8. Elytes-phosphorus is improved with dialysis. No indication for binder at this time  Subjective:   OR 5/20  for emergent evacuation of blood in chest. Tolerated HD 5/21.   Opens eyes today but not as interactive; appears to be frustrated understandably so.   Objective:   BP (!) 124/55   Pulse 95   Temp 98.5 F (36.9 C) (Oral)   Resp 17   Ht 5' 9" (1.753 m)   Wt 85.6 kg   SpO2 99%   BMI 27.87 kg/m   Intake/Output Summary (Last 24 hours) at 09/16/2019 1025 Last data filed at 09/16/2019 0900 Gross per 24 hour  Intake 1840 ml  Output 1904 ml  Net -64 ml   Weight change: 0 kg  Physical Exam: Gen: Alert on  vent via trach leftIJHD cath placed 5/7 CVS: Normal rate, open chest wound with wound vac in place Resp: cta, bilateral chest rise AbRKY:HCWCistention, +BS, soft, NT  Ext: 1+ edemaUEb/l GU: no foley Access: Lt SCVtemp (5/7)  Imaging: DG Chest Port 1 View  Result Date: 09/15/2019 CLINICAL DATA:  Respiratory failure EXAM: PORTABLE CHEST 1 VIEW COMPARISON:  Sep 14, 2019 FINDINGS: Tracheostomy catheter tip is 6.9 cm above the carina. Feeding tube tip is below the diaphragm. Central catheter tip is near the junction of the left innominate vein and superior vena cava, unchanged. A tube is either in or overlying the right hemithorax with the tip in the medial upper right hemithorax, stable. A new tube is either within or overlying the chest from the leftward approach with the tip just to the right of midline in the upper thoracic region. No pneumothorax evident. There is a left pleural effusion with left base atelectasis. The previously noted nodular opacity in the periphery of the right mid lung is stable. Heart remains mildly enlarged, stable, with pulmonary vascularity normal. No adenopathy. IMPRESSION: Tube and catheter positions as described without pneumothorax. Left pleural effusion with left base atelectasis persists. Stable nodular opacity right mid lung. No new opacity evident. Stable cardiac silhouette. Electronically Signed   By: WiLowella GripII M.D.   On: 09/15/2019 08:47    Labs: BMDIRECTVecent Labs  Lab 09/10/19 0437625/16/21 0483155/17/21 0217615/17/21 0260735/18/21 0510 09/13/19 0531 09/14/19 0225 09/14/19 1800 09/15/19 0009  09/15/19 0329 09/16/19 0438  NA 138   < > 138   < > 139 135 133* 133* 136 137 135  K 3.8   < > 3.9   < > 4.6 4.8 5.4* 5.6* 4.5 4.4 3.9  CL 100   < > 103   < > 102 95* 96* 97* 97* 96* 94*  CO2 24   < > 21*   < > 23 24 20* 22 21* 24 26  GLUCOSE 121*   < > 126*   < > 170* 118* 196* 265* 292* 249* 191*  BUN 56*   < > 85*   < > 116* 62* 96* 97*  109* 110* 82*  CREATININE 4.65*   < > 6.35*   < > 7.83* 5.16* 6.83* 6.78* 7.46* 7.59* 5.38*  CALCIUM 8.2*   < > 8.2*   < > 8.5* 8.4* 8.3* 7.8* 8.2* 7.9* 8.0*  PHOS 5.6*  --  6.3*  --  7.5* 5.5* 7.5*  --   --  9.2* 5.7*   < > = values in this interval not displayed.   CBC Recent Labs  Lab 09/14/19 0225 09/14/19 0225 09/14/19 0805 09/14/19 1425 09/15/19 0329 09/16/19 0438  WBC 8.8   < > 13.4* 11.9* 9.8 10.1  NEUTROABS 5.8  --  8.9*  --  6.9 6.7  HGB 7.2*   < > 6.1* 10.6* 9.0* 8.1*  HCT 22.8*   < > 20.6* 32.4* 27.2* 25.3*  MCV 97.9   < > 101.5* 94.5 95.1 97.3  PLT 362   < > 393 282 302 318   < > = values in this interval not displayed.    Medications:    .  stroke: mapping our early stages of recovery book   Does not apply Once  . sodium chloride   Intravenous Once  . sodium chloride   Intravenous Once  . sodium chloride   Intravenous Once  . amiodarone  200 mg Per Tube BID  . arformoterol  15 mcg Nebulization BID  . aspirin  81 mg Per Tube Daily  . atorvastatin  40 mg Per Tube Daily  . bisacodyl  10 mg Oral Daily   Or  . bisacodyl  10 mg Rectal Daily  . chlorhexidine gluconate (MEDLINE KIT)  15 mL Mouth Rinse BID  . Chlorhexidine Gluconate Cloth  6 each Topical Q0600  . clonazePAM  0.5 mg Per Tube BID  . darbepoetin (ARANESP) injection - NON-DIALYSIS  150 mcg Subcutaneous Q Mon-1800  . docusate  100 mg Per Tube BID  . feeding supplement (PRO-STAT SUGAR FREE 64)  60 mL Per Tube TID  . insulin aspart  0-24 Units Subcutaneous Q4H  . insulin detemir  15 Units Subcutaneous BID  . mouth rinse  15 mL Mouth Rinse 10 times per day  . midodrine  10 mg Per Tube TID WC  . multivitamin  1 tablet Oral QHS  . pantoprazole sodium  40 mg Per Tube Daily  . revefenacin  175 mcg Nebulization Daily  . sertraline  25 mg Per Tube Daily  . sevelamer carbonate  0.8 g Per Tube TID WC  . sodium chloride flush  10-40 mL Intracatheter Q12H  . sodium chloride flush  10-40 mL Intracatheter Q12H   . trospium  20 mg Per Tube BID      Otelia Santee, MD 09/16/2019, 10:25 AM

## 2019-09-17 LAB — CBC WITH DIFFERENTIAL/PLATELET
Abs Immature Granulocytes: 0.2 10*3/uL — ABNORMAL HIGH (ref 0.00–0.07)
Basophils Absolute: 0.1 10*3/uL (ref 0.0–0.1)
Basophils Relative: 1 %
Eosinophils Absolute: 0.9 10*3/uL — ABNORMAL HIGH (ref 0.0–0.5)
Eosinophils Relative: 8 %
HCT: 26.1 % — ABNORMAL LOW (ref 39.0–52.0)
Hemoglobin: 8.3 g/dL — ABNORMAL LOW (ref 13.0–17.0)
Immature Granulocytes: 2 %
Lymphocytes Relative: 12 %
Lymphs Abs: 1.3 10*3/uL (ref 0.7–4.0)
MCH: 31.1 pg (ref 26.0–34.0)
MCHC: 31.8 g/dL (ref 30.0–36.0)
MCV: 97.8 fL (ref 80.0–100.0)
Monocytes Absolute: 1.4 10*3/uL — ABNORMAL HIGH (ref 0.1–1.0)
Monocytes Relative: 12 %
Neutro Abs: 7.3 10*3/uL (ref 1.7–7.7)
Neutrophils Relative %: 65 %
Platelets: 361 10*3/uL (ref 150–400)
RBC: 2.67 MIL/uL — ABNORMAL LOW (ref 4.22–5.81)
RDW: 17.2 % — ABNORMAL HIGH (ref 11.5–15.5)
WBC: 11.2 10*3/uL — ABNORMAL HIGH (ref 4.0–10.5)
nRBC: 0 % (ref 0.0–0.2)

## 2019-09-17 LAB — RENAL FUNCTION PANEL
Albumin: 2 g/dL — ABNORMAL LOW (ref 3.5–5.0)
Anion gap: 15 (ref 5–15)
BUN: 121 mg/dL — ABNORMAL HIGH (ref 8–23)
CO2: 26 mmol/L (ref 22–32)
Calcium: 8.2 mg/dL — ABNORMAL LOW (ref 8.9–10.3)
Chloride: 97 mmol/L — ABNORMAL LOW (ref 98–111)
Creatinine, Ser: 6.91 mg/dL — ABNORMAL HIGH (ref 0.61–1.24)
GFR calc Af Amer: 8 mL/min — ABNORMAL LOW (ref >60)
GFR calc non Af Amer: 7 mL/min — ABNORMAL LOW (ref >60)
Glucose, Bld: 173 mg/dL — ABNORMAL HIGH (ref 70–99)
Phosphorus: 6.6 mg/dL — ABNORMAL HIGH (ref 2.5–4.6)
Potassium: 4.5 mmol/L (ref 3.5–5.1)
Sodium: 138 mmol/L (ref 135–145)

## 2019-09-17 LAB — GLUCOSE, CAPILLARY
Glucose-Capillary: 130 mg/dL — ABNORMAL HIGH (ref 70–99)
Glucose-Capillary: 141 mg/dL — ABNORMAL HIGH (ref 70–99)
Glucose-Capillary: 143 mg/dL — ABNORMAL HIGH (ref 70–99)
Glucose-Capillary: 163 mg/dL — ABNORMAL HIGH (ref 70–99)
Glucose-Capillary: 163 mg/dL — ABNORMAL HIGH (ref 70–99)
Glucose-Capillary: 204 mg/dL — ABNORMAL HIGH (ref 70–99)

## 2019-09-17 LAB — MAGNESIUM: Magnesium: 2.6 mg/dL — ABNORMAL HIGH (ref 1.7–2.4)

## 2019-09-17 MED ORDER — HYDROCORTISONE 1 % EX CREA
TOPICAL_CREAM | CUTANEOUS | Status: DC | PRN
  Filled 2019-09-17: qty 28

## 2019-09-17 MED ORDER — GUAIFENESIN 100 MG/5ML PO SOLN
5.0000 mL | ORAL | Status: DC | PRN
  Administered 2019-09-17: 100 mg via ORAL
  Filled 2019-09-17 (×2): qty 5

## 2019-09-17 NOTE — Progress Notes (Addendum)
[facility] Progress Note     Assessment/ Plan:   1. Shock-had beenon meripenem, vanc, and fluconazole but now on vanc.No pressors 2. AKI- (crt 1.22 on 07/24/19) following cardiac cath, cabg, cardiogenic shock, cardiac arrest due to tamponade, and acute on chronic CHF. Started CRRT on 08/24/19-5/12 due to extensive volume overload preventing chest closure.  1. Plan to maintain TTS schedule; last  HD 5/21 with net UF 1364m.  2. Seen on  HD today (his sat treatment)  and then will maintain TTS regimen -> 2K bath with goal 1.5L as tolerated. BP ok for now. 2. Will request VIR to place RIJ tunneled catheter onMon. 3. No UOP- Unclear if recovery is possible given major insult->bladder scan 5/17 (561m(no foley).  3. CAD s/p CABG x 4 complicated by cardiac tamponade s/p reopened chest urgently on 08/18/19 with wound vac.most recent OR On 5/12and then 5/20 (evacuation of blood in chest) 4. Acute systolic heart failure due to ischemic cardiomyopathy- volume markedly improved with CRRT. Will cont UF w/ HD since tolerating and no pressors 5. Postoperative acute hypoxic respiratory failure- reintubated 08/18/19 and likely aspiration. Now trach 6. Anemia- Hgbstable. transfuse prn.added ESA- darbe 150 7. Ileus-some improvement in distention. Per primary. 8. Elytes-phosphorus is improved with dialysis but had to add renvela to TF  Subjective:   OR5/2043femergent evacuation of blood in chest.  Opens eyes and no f/c, occasional nausea.    Objective:   BP (!) 102/51   Pulse 99   Temp 98.2 F (36.8 C) (Oral)   Resp 20   Ht 5' 9"  (1.753 m)   Wt 87 kg   SpO2 97%   BMI 28.32 kg/m   Intake/Output Summary (Last 24 hours) at 09/17/2019 [id number] data filed at 09/17/2019 0900 Gross per 24 hour  Intake 1370 ml  Output 360 ml  Net 1010 ml   Weight change: 0.3 kg  Physical Exam: Gen: Alert on vent via trach leftIJHD cath placed 5/7 CVS: Normal rate, open  chest wound with wound vac in place Resp: cta, bilateral chest rise AbdIFO:YDXAstention, +BS, soft, NT  Ext: 1+ edemaUEb/l GU: no foley Access: Lt SCVtemp (5/7)  Imaging: No results found.  Labs: BMET Recent Labs  Lab 09/11/19 0208 09/11/19 0208 09/12/19 0510 09/12/19 0510 09/13/19 0531 09/14/19 0225 09/14/19 1800 09/15/19 0009 09/15/19 0329 09/16/19 0438 09/17/19 0345  NA 138   < > 139   < > 135 133* 133* 136 137 135 138  K 3.9   < > 4.6   < > 4.8 5.4* 5.6* 4.5 4.4 3.9 4.5  CL 103   < > 102   < > 95* 96* 97* 97* 96* 94* 97*  CO2 21*   < > 23   < > 24 20* 22 21* 24 26 26   GLUCOSE 126*   < > 170*   < > 118* 196* 265* 292* 249* 191* 173*  BUN 85*   < > 116*   < > 62* 96* 97* 109* 110* 82* 121*  CREATININE 6.35*   < > 7.83*   < > 5.16* 6.83* 6.78* 7.46* 7.59* 5.38* 6.91*  CALCIUM 8.2*   < > 8.5*   < > 8.4* 8.3* 7.8* 8.2* 7.9* 8.0* 8.2*  PHOS 6.3*  --  7.5*  --  5.5* 7.5*  --   --  9.2* 5.7* 6.6*   < > = values in this interval not displayed.   CBC Recent Labs  Lab 09/14/19 0805  09/14/19 0805 09/14/19 1425 09/15/19 0329 09/16/19 0438 09/17/19 0345  WBC 13.4*   < > 11.9* 9.8 10.1 11.2*  NEUTROABS 8.9*  --   --  6.9 6.7 7.3  HGB 6.1*   < > 10.6* 9.0* 8.1* 8.3*  HCT 20.6*   < > 32.4* 27.2* 25.3* 26.1*  MCV 101.5*   < > 94.5 95.1 97.3 97.8  PLT 393   < > 282 302 318 361   < > = values in this interval not displayed.    Medications:    .  stroke: mapping our early stages of recovery book   Does not apply Once  . sodium chloride   Intravenous Once  . sodium chloride   Intravenous Once  . sodium chloride   Intravenous Once  . alteplase  2 mg Intracatheter Once  . amiodarone  200 mg Per Tube BID  . arformoterol  15 mcg Nebulization BID  . aspirin  81 mg Per Tube Daily  . atorvastatin  40 mg Per Tube Daily  . bisacodyl  10 mg Oral Daily   Or  . bisacodyl  10 mg Rectal Daily  . chlorhexidine gluconate (MEDLINE KIT)  15 mL Mouth Rinse BID  . Chlorhexidine  Gluconate Cloth  6 each Topical Q0600  . clonazePAM  0.5 mg Per Tube BID  . darbepoetin (ARANESP) injection - NON-DIALYSIS  150 mcg Subcutaneous Q Mon-1800  . docusate  100 mg Per Tube BID  . feeding supplement (PRO-STAT SUGAR FREE 64)  60 mL Per Tube TID  . insulin aspart  0-24 Units Subcutaneous Q4H  . insulin detemir  15 Units Subcutaneous BID  . mouth rinse  15 mL Mouth Rinse 10 times per day  . midodrine  10 mg Per Tube TID WC  . multivitamin  1 tablet Oral QHS  . pantoprazole sodium  40 mg Per Tube Daily  . revefenacin  175 mcg Nebulization Daily  . sertraline  25 mg Per Tube Daily  . sevelamer carbonate  0.8 g Per Tube TID WC  . sodium chloride flush  10-40 mL Intracatheter Q12H  . sodium chloride flush  10-40 mL Intracatheter Q12H  . trospium  20 mg Per Tube BID      Otelia Santee, MD 09/17/2019, 9:37 AM

## 2019-09-18 ENCOUNTER — Inpatient Hospital Stay (HOSPITAL_COMMUNITY): Payer: Medicare Other

## 2019-09-18 LAB — RENAL FUNCTION PANEL
Albumin: 2.1 g/dL — ABNORMAL LOW (ref 3.5–5.0)
Anion gap: 14 (ref 5–15)
BUN: 76 mg/dL — ABNORMAL HIGH (ref 8–23)
CO2: 28 mmol/L (ref 22–32)
Calcium: 8.6 mg/dL — ABNORMAL LOW (ref 8.9–10.3)
Chloride: 97 mmol/L — ABNORMAL LOW (ref 98–111)
Creatinine, Ser: 4.98 mg/dL — ABNORMAL HIGH (ref 0.61–1.24)
GFR calc Af Amer: 12 mL/min — ABNORMAL LOW (ref >60)
GFR calc non Af Amer: 11 mL/min — ABNORMAL LOW (ref >60)
Glucose, Bld: 62 mg/dL — ABNORMAL LOW (ref 70–99)
Phosphorus: 5.3 mg/dL — ABNORMAL HIGH (ref 2.5–4.6)
Potassium: 4 mmol/L (ref 3.5–5.1)
Sodium: 139 mmol/L (ref 135–145)

## 2019-09-18 LAB — TYPE AND SCREEN
ABO/RH(D): O POS
Antibody Screen: NEGATIVE
Unit division: 0
Unit division: 0
Unit division: 0
Unit division: 0

## 2019-09-18 LAB — BPAM RBC
Blood Product Expiration Date: 202106182359
Blood Product Expiration Date: 202106182359
Blood Product Expiration Date: 202106182359
Blood Product Expiration Date: 202106192359
ISSUE DATE / TIME: 202105200826
ISSUE DATE / TIME: 202105200826
ISSUE DATE / TIME: 202105200912
ISSUE DATE / TIME: 202105200912
Unit Type and Rh: 5100
Unit Type and Rh: 5100
Unit Type and Rh: 5100
Unit Type and Rh: 5100

## 2019-09-18 LAB — CBC
HCT: 28.1 % — ABNORMAL LOW (ref 39.0–52.0)
Hemoglobin: 8.8 g/dL — ABNORMAL LOW (ref 13.0–17.0)
MCH: 30.9 pg (ref 26.0–34.0)
MCHC: 31.3 g/dL (ref 30.0–36.0)
MCV: 98.6 fL (ref 80.0–100.0)
Platelets: 373 10*3/uL (ref 150–400)
RBC: 2.85 MIL/uL — ABNORMAL LOW (ref 4.22–5.81)
RDW: 16.8 % — ABNORMAL HIGH (ref 11.5–15.5)
WBC: 13.1 10*3/uL — ABNORMAL HIGH (ref 4.0–10.5)
nRBC: 0 % (ref 0.0–0.2)

## 2019-09-18 LAB — GLUCOSE, CAPILLARY
Glucose-Capillary: 133 mg/dL — ABNORMAL HIGH (ref 70–99)
Glucose-Capillary: 133 mg/dL — ABNORMAL HIGH (ref 70–99)
Glucose-Capillary: 134 mg/dL — ABNORMAL HIGH (ref 70–99)
Glucose-Capillary: 146 mg/dL — ABNORMAL HIGH (ref 70–99)
Glucose-Capillary: 190 mg/dL — ABNORMAL HIGH (ref 70–99)
Glucose-Capillary: 56 mg/dL — ABNORMAL LOW (ref 70–99)
Glucose-Capillary: 65 mg/dL — ABNORMAL LOW (ref 70–99)
Glucose-Capillary: 77 mg/dL (ref 70–99)
Glucose-Capillary: 81 mg/dL (ref 70–99)

## 2019-09-18 LAB — MAGNESIUM: Magnesium: 2.5 mg/dL — ABNORMAL HIGH (ref 1.7–2.4)

## 2019-09-18 MED ORDER — OXYCODONE HCL 5 MG PO TABS
5.0000 mg | ORAL_TABLET | Freq: Four times a day (QID) | ORAL | Status: DC
  Administered 2019-09-18 – 2019-09-28 (×41): 5 mg
  Filled 2019-09-18 (×42): qty 1

## 2019-09-18 MED ORDER — INSULIN DETEMIR 100 UNIT/ML ~~LOC~~ SOLN
10.0000 [IU] | Freq: Two times a day (BID) | SUBCUTANEOUS | Status: DC
  Administered 2019-09-19 – 2019-09-30 (×23): 10 [IU] via SUBCUTANEOUS
  Filled 2019-09-18 (×27): qty 0.1

## 2019-09-18 MED ORDER — DEXTROSE 50 % IV SOLN: 12.5000 g | INTRAVENOUS | Status: AC

## 2019-09-18 MED ORDER — INSULIN ASPART 100 UNIT/ML ~~LOC~~ SOLN
0.0000 [IU] | SUBCUTANEOUS | Status: DC
  Administered 2019-09-18: 2 [IU] via SUBCUTANEOUS
  Administered 2019-09-19: 3 [IU] via SUBCUTANEOUS
  Administered 2019-09-19: 2 [IU] via SUBCUTANEOUS
  Administered 2019-09-20 (×4): 3 [IU] via SUBCUTANEOUS
  Administered 2019-09-21: 2 [IU] via SUBCUTANEOUS
  Administered 2019-09-21: 3 [IU] via SUBCUTANEOUS
  Administered 2019-09-21: 5 [IU] via SUBCUTANEOUS
  Administered 2019-09-21: 3 [IU] via SUBCUTANEOUS
  Administered 2019-09-22 (×2): 2 [IU] via SUBCUTANEOUS
  Administered 2019-09-22 – 2019-09-23 (×4): 3 [IU] via SUBCUTANEOUS
  Administered 2019-09-23: 2 [IU] via SUBCUTANEOUS
  Administered 2019-09-23 (×2): 3 [IU] via SUBCUTANEOUS
  Administered 2019-09-23: 5 [IU] via SUBCUTANEOUS
  Administered 2019-09-23: 2 [IU] via SUBCUTANEOUS
  Administered 2019-09-24: 5 [IU] via SUBCUTANEOUS
  Administered 2019-09-24: 2 [IU] via SUBCUTANEOUS
  Administered 2019-09-24 (×2): 3 [IU] via SUBCUTANEOUS
  Administered 2019-09-24 – 2019-09-25 (×4): 2 [IU] via SUBCUTANEOUS
  Administered 2019-09-25: 5 [IU] via SUBCUTANEOUS
  Administered 2019-09-25: 2 [IU] via SUBCUTANEOUS
  Administered 2019-09-25 – 2019-09-26 (×6): 3 [IU] via SUBCUTANEOUS
  Administered 2019-09-26: 5 [IU] via SUBCUTANEOUS
  Administered 2019-09-26: 3 [IU] via SUBCUTANEOUS
  Administered 2019-09-26: 5 [IU] via SUBCUTANEOUS
  Administered 2019-09-27 – 2019-09-28 (×8): 3 [IU] via SUBCUTANEOUS
  Administered 2019-09-28: 5 [IU] via SUBCUTANEOUS
  Administered 2019-09-28: 1 [IU] via SUBCUTANEOUS
  Administered 2019-09-28 – 2019-09-29 (×2): 3 [IU] via SUBCUTANEOUS
  Administered 2019-09-29: 2 [IU] via SUBCUTANEOUS
  Administered 2019-09-29: 3 [IU] via SUBCUTANEOUS
  Administered 2019-09-29: 2 [IU] via SUBCUTANEOUS
  Administered 2019-09-29 – 2019-09-30 (×7): 3 [IU] via SUBCUTANEOUS
  Administered 2019-10-01: 2 [IU] via SUBCUTANEOUS
  Administered 2019-10-01 – 2019-10-02 (×10): 3 [IU] via SUBCUTANEOUS
  Administered 2019-10-03: 2 [IU] via SUBCUTANEOUS
  Administered 2019-10-03: 3 [IU] via SUBCUTANEOUS
  Administered 2019-10-03: 8 [IU] via SUBCUTANEOUS
  Administered 2019-10-03: 5 [IU] via SUBCUTANEOUS
  Administered 2019-10-03 – 2019-10-04 (×2): 3 [IU] via SUBCUTANEOUS
  Administered 2019-10-05: 2 [IU] via SUBCUTANEOUS
  Administered 2019-10-05: 3 [IU] via SUBCUTANEOUS
  Administered 2019-10-05: 2 [IU] via SUBCUTANEOUS
  Administered 2019-10-05 – 2019-10-06 (×3): 3 [IU] via SUBCUTANEOUS
  Administered 2019-10-06: 5 [IU] via SUBCUTANEOUS
  Administered 2019-10-06: 3 [IU] via SUBCUTANEOUS
  Administered 2019-10-06 (×2): 2 [IU] via SUBCUTANEOUS
  Administered 2019-10-07: 5 [IU] via SUBCUTANEOUS
  Administered 2019-10-07 (×2): 2 [IU] via SUBCUTANEOUS
  Administered 2019-10-08 (×2): 3 [IU] via SUBCUTANEOUS
  Administered 2019-10-09: 5 [IU] via SUBCUTANEOUS
  Administered 2019-10-09: 3 [IU] via SUBCUTANEOUS
  Administered 2019-10-09: 5 [IU] via SUBCUTANEOUS
  Administered 2019-10-09: 2 [IU] via SUBCUTANEOUS
  Administered 2019-10-10 (×3): 3 [IU] via SUBCUTANEOUS
  Administered 2019-10-10 (×2): 2 [IU] via SUBCUTANEOUS
  Administered 2019-10-11: 3 [IU] via SUBCUTANEOUS
  Administered 2019-10-11 (×2): 2 [IU] via SUBCUTANEOUS
  Administered 2019-10-11: 3 [IU] via SUBCUTANEOUS
  Administered 2019-10-11: 5 [IU] via SUBCUTANEOUS
  Administered 2019-10-12 (×2): 2 [IU] via SUBCUTANEOUS
  Administered 2019-10-12: 3 [IU] via SUBCUTANEOUS
  Administered 2019-10-12: 2 [IU] via SUBCUTANEOUS
  Administered 2019-10-12 – 2019-10-13 (×2): 3 [IU] via SUBCUTANEOUS
  Administered 2019-10-13: 2 [IU] via SUBCUTANEOUS
  Administered 2019-10-13: 3 [IU] via SUBCUTANEOUS
  Administered 2019-10-13 – 2019-10-14 (×4): 2 [IU] via SUBCUTANEOUS
  Administered 2019-10-14 – 2019-10-16 (×3): 3 [IU] via SUBCUTANEOUS
  Administered 2019-10-17: 2 [IU] via SUBCUTANEOUS
  Administered 2019-10-17 (×3): 3 [IU] via SUBCUTANEOUS
  Administered 2019-10-18: 8 [IU] via SUBCUTANEOUS
  Administered 2019-10-18: 2 [IU] via SUBCUTANEOUS
  Administered 2019-10-18: 3 [IU] via SUBCUTANEOUS

## 2019-09-18 NOTE — Significant Event (Addendum)
Hypoglycemic Event  CBG: 65  Treatment:D5 35ml given   Symptoms: None   Follow-up CBG: Time:1625 CBG Result:146  Possible Reasons for Event: NPO, tube feeds held for procedure today.   Comments/MD notified:N/A. Followed hypoglycemic protocol.    Magalie Almon

## 2019-09-18 NOTE — Progress Notes (Signed)
Inpatient Diabetes Program Recommendations  AACE/ADA: New Consensus Statement on Inpatient Glycemic Control (2015)  Target Ranges:  Prepandial:   less than 140 mg/dL      Peak postprandial:   less than 180 mg/dL (1-2 hours)      Critically ill patients:  140 - 180 mg/dL   Lab Results  Component Value Date   GLUCAP 77 09/18/2019   HGBA1C 7.7 (H) 08/14/2019    Review of Glycemic Control Results for Christian [name], Christian [name] (MRN [id number]) as of 09/18/2019 10:10  Ref. Range 09/17/2019 23:40 09/18/2019 03:37 09/18/2019 04:07 09/18/2019 06:24 09/18/2019 07:44  Glucose-Capillary Latest Ref Range: 70 - 99 mg/dL 204 (H) 56 (L) 133 (H) 81 77   Inpatient Diabetes Program Recommendations:   With tube feeds on hold: -Decrease Levemir to 10 units bid -Decrease Novolog correction to moderate q 4 hrs.  When tube feeds resume, consider tube feed coverage q 4 hrs.  Discussed recommendations with Christian Griffon, NP.  Thank you, Christian [name]. Christian Lyons, RN, MSN, CDE  Diabetes Coordinator Inpatient Glycemic Control Team Team Pager [phone] (8am-5pm) 09/18/2019 10:13 AM

## 2019-09-18 NOTE — Evaluation (Signed)
[facility] Speaking Valve - Evaluation Patient Details  Name: Christian [name] MRN: [id number] Date of Birth: [birth year]  Today's Date: 09/18/2019 Time: 3570-1779 SLP Time Calculation (min) (ACUTE ONLY): 15 min  Past Medical History:  Past Medical History:  Diagnosis Date  . BPH (benign prostatic hypertrophy)   . Coronary artery disease   . Diabetes mellitus without complication ([place])   . Dry eyes left  . Hiatal hernia   . 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  LIMA TO LAD, RIMA TO PDA, RADIAL ARTERY TO CIRC AND SEQUENTIALLY TO OM1. 08/15/2019  . Hyperlipidemia   . Hypertension   . Incomplete bladder emptying   . Nocturia   . 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 SOB (shortness of breath) on exertion    Past Surgical History:  Past Surgical History:  Procedure Laterality Date  . APPLICATION OF WOUND VAC N/A 08/24/2019   Procedure: APPLICATION OF WOUND VAC;  Surgeon: [name], MD;  Location: [place];  Service: Thoracic;  Laterality: N/A;  . APPLICATION OF WOUND VAC  08/29/2019   Procedure: Wound Vac change;  Surgeon: [name], MD;  Location: [place] [facility];  Service: Open Heart Surgery;;  . APPLICATION OF WOUND VAC N/A 09/04/2019   Procedure: Application Of Wound Vac;  Surgeon: Grace Pierce, MD;  Location: [place];  Service: Open Heart Surgery;  Laterality: N/A;  . APPLICATION OF WOUND VAC N/A 09/06/2019   Procedure: APPLICATION OF ACELL, APPLICATION OF WOUND VAC USING PREVENA INCISIONAL  DRESSING;  Surgeon: Wallace Going, DO;  Location: [place];  Service: Plastics;  Laterality: N/A;  . CARDIAC CATHETERIZATION    . CORONARY ARTERY BYPASS GRAFT N/A 08/15/2019   Procedure: CORONARY ARTERY BYPASS GRAFTING (CABG), x 4 using bilateral IMAs and left radial artery .  LIMA TO LAD, RIMA TO PDA, RADIAL ARTERY TO CIRC AND SEQUENTIALLY TO OM1.;   Surgeon: [name], MD;  Location: [place];  Service: Open Heart Surgery;  Laterality: N/A;  . CORONARY STENT PLACEMENT  02/27/2014   distal rt/pd coronary       dr Einar Gip  . CYSTO/ BLADDER BIOPSY'S/ CAUTHERIZATION  01-14-2004  DR Gaynelle Arabian  . EXPLORATION POST OPERATIVE OPEN HEART N/A 08/16/2019   Procedure: Chest Closure S?P CABG WITH APPLICATION OF PREVENA  INCISIONAL WOUND VAC;  Surgeon: [name], MD;  Location: [facility];  Service: Open Heart Surgery;  Laterality: N/A;  . EXPLORATION POST OPERATIVE OPEN HEART N/A 08/21/2019   Procedure: CHEST WASHOUT S/P OPEN CHEST;  Surgeon: [name], MD;  Location: [place];  Service: Open Heart Surgery;  Laterality: N/A;  Open chest with Esmark dressing with Ioban sealant coverage.  . EXPLORATION POST OPERATIVE OPEN HEART N/A 08/18/2019   Procedure: EXPLORATION POST OPERATIVE OPEN HEART (performed 04/23 on unit);  Surgeon: [name], MD;  Location: [place];  Service: Open Heart Surgery;  Laterality: N/A;  . EXPLORATION POST OPERATIVE OPEN HEART N/A 08/24/2019   Procedure: CHEST WASHOUT POST OPERATIVE OPEN HEART;  Surgeon: [name], MD;  Location: [place];  Service: Open Heart Surgery;  Laterality: N/A;  . EXPLORATION POST OPERATIVE OPEN HEART N/A 08/29/2019   Procedure: CHEST WOUND WASHOUT POST OPERATIVE OPEN HEART;  Surgeon: [name], MD;  Location: [place];  Service: Open Heart Surgery;  Laterality: N/A;  .  EXPLORATION POST OPERATIVE OPEN HEART N/A 09/04/2019   Procedure: MEDIASTINAL EXPLORATION WITH STERNAL WOUND IRRIGATION;  Surgeon: Grace Mercedes, MD;  Location: [place];  Service: Open Heart Surgery;  Laterality: N/A;  . EXPLORATION POST OPERATIVE OPEN HEART N/A 09/14/2019   Procedure: EVACUATION OF HEMATOMA;  Surgeon: Grace Jovante, MD;  Location: [place];  Service: Open Heart Surgery;  Laterality: N/A;  . LAPAROSCOPIC LYSIS OF ADHESIONS N/A 09/06/2019   Procedure: LAPAROSCOPIC OMENTAL HARVEST;  Surgeon: Kinsinger,  Arta Bruce, MD;  Location: [place];  Service: General;  Laterality: N/A;  . LEFT HEART CATH AND CORONARY ANGIOGRAPHY N/A 08/10/2019   Procedure: LEFT HEART CATH AND CORONARY ANGIOGRAPHY;  Surgeon: Nigel Mormon, MD;  Location: [place] [facility];  Service: Cardiovascular;  Laterality: N/A;  . LEFT HEART CATHETERIZATION WITH CORONARY ANGIOGRAM N/A 02/27/2014   Procedure: LEFT HEART CATHETERIZATION WITH CORONARY ANGIOGRAM;  Surgeon: Laverda Page, MD;  Location: [facility] CATH LAB;  Service: Cardiovascular;  Laterality: N/A;  . MEDIASTINAL EXPLORATION N/A 09/06/2019   Procedure: MEDIASTINAL EXPLORATION;  Surgeon: Grace Braulio, MD;  Location: [place];  Service: Thoracic;  Laterality: N/A;  . PECTORALIS FLAP  09/06/2019   Procedure: Pectoralis ADVANCEMENT Flap;  Surgeon: Wallace Going, DO;  Location: [place];  Service: Plastics;;  . PERCUTANEOUS CORONARY STENT INTERVENTION (PCI-S)  02/27/2014   Procedure: PERCUTANEOUS CORONARY STENT INTERVENTION (PCI-S);  Surgeon: Laverda Page, MD;  Location: [facility] CATH LAB;  Service: Cardiovascular;;  rt PDA  3.0/28mm Promus stent  . RADIAL ARTERY HARVEST Left 08/15/2019   Procedure: Radial Artery Harvest;  Surgeon: [name], MD;  Location: [place];  Service: Open Heart Surgery;  Laterality: Left;  . RIB PLATING N/A 09/06/2019   Procedure: STERNAL PLATING;  Surgeon: Grace Darrol, MD;  Location: [place];  Service: Thoracic;  Laterality: N/A;  . TEE WITHOUT CARDIOVERSION N/A 08/15/2019   Procedure: TRANSESOPHAGEAL ECHOCARDIOGRAM (TEE);  Surgeon: [name], MD;  Location: [place];  Service: Open Heart Surgery;  Laterality: N/A;  . TRACHEOSTOMY TUBE PLACEMENT  08/29/2019   Procedure: Tracheostomy;  Surgeon: [name], MD;  Location: [place] [facility];  Service: Open Heart Surgery;;  . TRANSURETHRAL RESECTION OF PROSTATE  04/04/2012   Procedure: TRANSURETHRAL RESECTION OF THE PROSTATE WITH GYRUS INSTRUMENTS;  Surgeon: Ailene Rud, MD;  Location:  [place];  Service: Urology;  Laterality: N/A;  . TRANSURETHRAL RESECTION OF PROSTATE N/A 09/27/2014   Procedure: TRANSURETHRAL RESECTION OF THE PROSTATE ;  Surgeon: Carolan Clines, MD;  Location: [facility];  Service: Urology;  Laterality: N/A;  . UPPER GASTROINTESTINAL ENDOSCOPY     HPI:  74 yo admitted with NSTEMI 4/15, RLL PE, 4/20 CABG x 4 remained open with wound VAC with repeated washouts. 4/23 cardiac arrest, 4/29 initiated CRRT, vent s/p trach. 5/10 additional sternal washout unable to close.  5/12 sternal plating with pectoralis flap and omental harvest to mediastinum off CRRT with iHD started. Chest wound closed. Neurology consulted for left sided weakness- CTH neg 09/08/19. 5/20 pt with emergent hematoma evacuation and transition to trach collar; 5/24 downsized to #6 cuffed trach. PMHx: BPH, CAD, DM, HLD, HTN   Assessment / Plan / Recommendation Clinical Impression  Pt presented with excellent toleration of PMV during initial assessment. Cuff was deflated at baseline; pt downsized today to #6.  He was able to access upper airway for phonation. Voice was hypophonic and hoarse; he required consistent verbal cues to inhale before initiating speech  and to increase volume.  VS remained stable during use.  Primary barrier to success was his fatigue.  When he could be sufficiently awakened, he was able to recite DOW, count to ten, greet his wife and name their children.  Wife was present at end of session - explained purpose of valve and discussed how encouraging it was that pt was so successful on first trial. Recommend that pt begin using PMV during staff interactions; he may wear valve when fully supervised by RN or therapy staff.  SLP will follow for communication, increased duration of use, intelligibility.  SLP Visit Diagnosis: Aphonia (R49.1)    SLP Assessment  Patient needs continued Speech Lanaguage Pathology Services    Follow Up Recommendations  Inpatient Rehab     Frequency and Duration min 3x week  2 weeks    PMSV Trial PMSV was placed for: 10 minutes Able to redirect subglottic air through upper airway: Yes Able to Attain Phonation: Yes Voice Quality: Hoarse;Low vocal intensity Able to Expectorate Secretions: No attempts Level of Secretion Expectoration with PMSV: Not observed Breath Support for Phonation: Moderately decreased Intelligibility: Intelligible Respirations During Trial: 22 SpO2 During Trial: 99 % Pulse During Trial: 102 Behavior: (sleepy)   Tracheostomy Tube  Additional Tracheostomy Tube Assessment Fenestrated: No    Vent Dependency  Vent Dependent: No FiO2 (%): 28 %    Cuff Deflation Trial  GO Tolerated Cuff Deflation: Yes Length of Time for Cuff Deflation Trial: (deflated at baseline) Behavior: (fatigued)        [name] 09/18/2019, 3:07 PM Daxx Tiggs L. Tivis Ringer, [place] Office number [phone] Pager [phone]

## 2019-09-18 NOTE — Progress Notes (Signed)
Inpatient Rehabilitation-Admissions Coordinator   CIR consult received. Pt progressing with therapy per PT note. Noted LTAC still recommended as as well. Will follow along closely to help determine if CIR is an appropriate venue once pt is ready for his next level of care.   Raechel Ache, OTR/L  Rehab Admissions Coordinator  [phone] 09/18/2019 2:03 PM

## 2019-09-18 NOTE — Progress Notes (Signed)
Physical Therapy Treatment Patient Details Name: Christian [name] MRN: [id number] DOB: Nov 15, 1945 Today's Date: 09/18/2019    History of Present Illness 74 yo admitted with NSTEMI 4/15, RLL PE, 4/20 CABG x 4 remained open with wound VAC with repeated washouts. 4/23 cardiac arrest, 4/ 29 initiated CRRT, vent s/p trach. 5/10 additional sternal washout unable to close.  5/12 sternal plating with pectoralis flap and omental harvest to mediastinum off CRRT with iHD started. Chest wound closed. Neurology consulted for left sided weakness- CTH neg 09/08/19. 5/20 pt with emergent hematoma evacuation and transition to trach collar. PMHx: BPH, CAD, DM, HLD, HTN    PT Comments    Pt with excellent progression able to transition to EOB, standing trials and lift OOB to chair. Pt fatigues quickly with trunk control with assist and cues for trunk and neck positioning. Pt happy to have back scratched and be OOB. Attempted PROM for left lateral neck flexion but pt so tight and painful not appreciable ROM achieved with pt propped with wedge in sitting to limit right lean. Pt educated for HEP and progression.   Pt on 5L trach collar 28% fiO2 with SpO2 99% HR 106-115 Pre 131/74  Post 119/86 (96)   Follow Up Recommendations  LTACH;Supervision/Assistance - 24 hour;CIR     Equipment Recommendations  Other (comment)(TBD)    Recommendations for Other Services       Precautions / Restrictions Precautions Precautions: Fall;Sternal Precaution Comments: trach, sternal flap with VAC, jp drains, flexiseal Restrictions RUE Partial Weight Bearing Percentage or Pounds: Sternal Precautions    Mobility  Bed Mobility Overal bed mobility: Needs Assistance Bed Mobility: Supine to Sit     Supine to sit: Max assist;HOB elevated     General bed mobility comments: max assist to pivot to right side of bed with cues and assist to move legs and elevate trunk  Transfers Overall transfer level: Needs assistance    Transfers: Sit to/from Stand Sit to Stand: Max assist;+2 physical assistance;From elevated surface         General transfer comment: max +2 physical assist with bil knees blocked and assist of pad at sacrum to stand from elevated bed x 2 trials. Transfer bed to chair via maxisky  Ambulation/Gait             General Gait Details: unable   Stairs             Wheelchair Mobility    Modified Rankin (Stroke Patients Only)       Balance Overall balance assessment: Needs assistance Sitting-balance support: Bilateral upper extremity supported Sitting balance-Leahy Scale: Poor Sitting balance - Comments: pt able to sit initially EOB with minguard but fatigues quickly with trunk control requiring mod assist to control trunk with mod cues for midline posture with neck and trunk extension Postural control: Right lateral lean   Standing balance-Leahy Scale: Zero Standing balance comment: bil UE support with assist at pelvis                            Cognition Arousal/Alertness: Awake/alert Behavior During Therapy: Flat affect Overall Cognitive Status: Difficult to assess                                 General Comments: pt mouthing responses and nodding head, delayed correction to sitting balance cues. Pt reporting back itches      Exercises  General Exercises - Lower Extremity Long Arc Quad: Seated;Both;5 reps;AAROM Hip Flexion/Marching: Both;Seated;10 reps;AAROM    General Comments        Pertinent Vitals/Pain Pain Score: 5  Pain Location: buttocks with lift to chair due to repositioning of flexiseal Pain Descriptors / Indicators: Discomfort;Grimacing Pain Intervention(s): Limited activity within patient's tolerance;Monitored during session;Repositioned    Home Living                      Prior Function            PT Goals (current goals can now be found in the care plan section) Progress towards PT goals: Progressing  toward goals    Frequency    Min 3X/week      PT Plan Discharge plan needs to be updated;Frequency needs to be updated    Co-evaluation PT/OT/SLP Co-Evaluation/Treatment: Yes Reason for Co-Treatment: Complexity of the patient's impairments (multi-system involvement);For patient/therapist safety PT goals addressed during session: Mobility/safety with mobility;Strengthening/ROM        AM-PAC PT "6 Clicks" Mobility   Outcome Measure  Help needed turning from your back to your side while in a flat bed without using bedrails?: Total Help needed moving from lying on your back to sitting on the side of a flat bed without using bedrails?: Total Help needed moving to and from a bed to a chair (including a wheelchair)?: Total Help needed standing up from a chair using your arms (e.g., wheelchair or bedside chair)?: Total Help needed to walk in hospital room?: Total Help needed climbing 3-5 steps with a railing? : Total 6 Click Score: 6    End of Session Equipment Utilized During Treatment: Gait belt Activity Tolerance: Patient tolerated treatment well Patient left: in chair;with call bell/phone within reach;with chair alarm set;with nursing/sitter in room Nurse Communication: Mobility status;Need for lift equipment;Precautions PT Visit Diagnosis: Other abnormalities of gait and mobility (R26.89);Muscle weakness (generalized) (M62.81);Other symptoms and signs involving the nervous system (R29.898)     Time: 2993-7169 PT Time Calculation (min) (ACUTE ONLY): 32 min  Charges:  $Therapeutic Activity: 8-22 mins                     Shaune Malacara P, PT Acute Rehabilitation Services Pager: [phone] Office: [place] [facility] 09/18/2019, 12:49 PM

## 2019-09-18 NOTE — Progress Notes (Signed)
[facility] Progress Note     Assessment/ Plan:   1. Shock-had beenon meripenem, vanc, and fluconazole but now on vanc.No pressors 2. AKI- (crt 1.22 on 07/24/19) following cardiac cath, cabg, cardiogenic shock, cardiac arrest due to tamponade, and acute on chronic CHF. Started CRRT on 08/24/19-5/12 due to extensive volume overload preventing chest closure.  1. Last HD 5/23 (make up for 5/22)  and then will maintain TTS regimen with next HD tomorrow -> 3K bath with goal 1.5-2L as tolerated. 2. RIJ TDC has been ordered for IR, appreicate placing 3. Remains anuric- Unclear if recovery is possible given major insult->bladder scan 5/17 (58m)(no foley).  3. CAD s/p CABG x 4 complicated by cardiac tamponade s/p reopened chest urgently on 08/18/19 with wound vac.most recent OR On 5/12and then 5/20 (evacuation of blood in chest) 4. Acute systolic heart failure due to ischemic cardiomyopathy- volume markedly improved with CRRT. Will cont UF w/ HD since tolerating and no pressors 5. Postoperative acute hypoxic respiratory failure- reintubated 08/18/19 and likely aspiration. Now trach 6. Anemia- Hgbstable in 8s. transfuse prn.added ESA- darbe 150 7. Ileus-some improvement in distention. Per primary. 8. Elytes-phosphorus is improved with dialysis but had to add renvela to TF  Subjective:   Getting OOB with PT this AM.  Denies new complaints. Denies dyspnea.  CXR done this AM with some worsening opacities ? Edema.  Per RN has been stable on trach collar 28%.     Objective:   BP 132/64   Pulse (!) 103   Temp 98.4 F (36.9 C)   Resp (!) 24   Ht 5' 9"  (1.753 m)   Wt 85.9 kg   SpO2 100%   BMI 27.97 kg/m   Intake/Output Summary (Last 24 hours) at 09/18/2019 0816 Last data filed at 09/18/2019 0745 Gross per 24 hour  Intake 830 ml  Output 1027 ml  Net -197 ml   Weight change: 0.4 kg  Physical Exam: Gen: At edge of bed with PT CVS: Tachycardic Resp: coarse BS,  blunted in bases L > R, no rales appreciated Ext: diffuse pitting edema GU: no foley Access: Lt SCVtemp (5/7)  Imaging: DG CHEST PORT 1 VIEW  Result Date: 09/18/2019 CLINICAL DATA:  Rhonchi EXAM: PORTABLE CHEST 1 VIEW COMPARISON:  09/15/2019 FINDINGS: Support devices are stable. Prior CABG. Cardiomegaly. Worsening bilateral airspace opacities, most confluent in the left lower lobe. No visible effusions or acute bony abnormality. IMPRESSION: Worsening bilateral airspace disease, favor edema. Dense consolidation in the left lower lobe could reflect atelectasis or pneumonia. Electronically Signed   By: KRolm BaptiseM.D.   On: 09/18/2019 07:21    Labs: BMET Recent Labs  Lab 09/12/19 0510 09/12/19 0510 09/13/19 0531 09/13/19 0531 09/14/19 0225 09/14/19 1800 09/15/19 0009 09/15/19 0329 09/16/19 0438 09/17/19 0345 09/18/19 0336  NA 139   < > 135   < > 133* 133* 136 137 135 138 139  K 4.6   < > 4.8   < > 5.4* 5.6* 4.5 4.4 3.9 4.5 4.0  CL 102   < > 95*   < > 96* 97* 97* 96* 94* 97* 97*  CO2 23   < > 24   < > 20* 22 21* 24 26 26 28   GLUCOSE 170*   < > 118*   < > 196* 265* 292* 249* 191* 173* 62*  BUN 116*   < > 62*   < > 96* 97* 109* 110* 82* 121* 76*  CREATININE 7.83*   < >  5.16*   < > 6.83* 6.78* 7.46* 7.59* 5.38* 6.91* 4.98*  CALCIUM 8.5*   < > 8.4*   < > 8.3* 7.8* 8.2* 7.9* 8.0* 8.2* 8.6*  PHOS 7.5*  --  5.5*  --  7.5*  --   --  9.2* 5.7* 6.6* 5.3*   < > = values in this interval not displayed.   CBC Recent Labs  Lab 09/14/19 0805 09/14/19 1425 09/15/19 0329 09/16/19 0438 09/17/19 0345 09/18/19 0336  WBC 13.4*   < > 9.8 10.1 11.2* 13.1*  NEUTROABS 8.9*  --  6.9 6.7 7.3  --   HGB 6.1*   < > 9.0* 8.1* 8.3* 8.8*  HCT 20.6*   < > 27.2* 25.3* 26.1* 28.1*  MCV 101.5*   < > 95.1 97.3 97.8 98.6  PLT 393   < > 302 318 361 373   < > = values in this interval not displayed.    Medications:    .  stroke: mapping our early stages of recovery book   Does not apply Once  .  sodium chloride   Intravenous Once  . sodium chloride   Intravenous Once  . sodium chloride   Intravenous Once  . alteplase  2 mg Intracatheter Once  . amiodarone  200 mg Per Tube BID  . arformoterol  15 mcg Nebulization BID  . aspirin  81 mg Per Tube Daily  . atorvastatin  40 mg Per Tube Daily  . bisacodyl  10 mg Oral Daily   Or  . bisacodyl  10 mg Rectal Daily  . chlorhexidine gluconate (MEDLINE KIT)  15 mL Mouth Rinse BID  . Chlorhexidine Gluconate Cloth  6 each Topical Q0600  . clonazePAM  0.5 mg Per Tube BID  . darbepoetin (ARANESP) injection - NON-DIALYSIS  150 mcg Subcutaneous Q Mon-1800  . docusate  100 mg Per Tube BID  . feeding supplement (PRO-STAT SUGAR FREE 64)  60 mL Per Tube TID  . insulin aspart  0-24 Units Subcutaneous Q4H  . insulin detemir  15 Units Subcutaneous BID  . mouth rinse  15 mL Mouth Rinse 10 times per day  . midodrine  10 mg Per Tube TID WC  . multivitamin  1 tablet Oral QHS  . pantoprazole sodium  40 mg Per Tube Daily  . revefenacin  175 mcg Nebulization Daily  . sertraline  25 mg Per Tube Daily  . sevelamer carbonate  0.8 g Per Tube TID WC  . sodium chloride flush  10-40 mL Intracatheter Q12H  . sodium chloride flush  10-40 mL Intracatheter Q12H  . trospium  20 mg Per Tube BID    Jannifer Hick MD [facility] Pager [phone]

## 2019-09-18 NOTE — Procedures (Signed)
Tracheostomy Change Note  Patient Details:   Name: Christian [name] DOB: 1946-01-07 MRN: [id number]    Airway Documentation:     Evaluation  O2 sats: stable throughout Complications: No apparent complications Patient did tolerate procedure well. Bilateral Breath Sounds: Diminished    Pts trach downsized to # 6 cuffed shiley. Pt tolerated procedure well.   Vilinda Blanks 09/18/2019, 12:17 PM

## 2019-09-18 NOTE — Progress Notes (Signed)
NAME:  LAREN WHALING, MRN:  [id number], DOB:  Apr 24, 1946, LOS: 4 ADMISSION DATE:  08/09/2019, CONSULTATION DATE:  08/28/2019 REFERRING MD:  Orvan Seen - TRH, CHIEF COMPLAINT:  Respiratory failure.    HPI/course in hospital   74 year old man who presented with progressive dyspnea.  He was admitted 4/14 and was incidentally found to have a right lower lobe pulmonary embolism.  Overall the picture was that of decompensated heart failure with a non-STEMI.  Coronary angiography revealed triple-vessel disease and the patient was referred for cardiac surgery.  EF was found to be reduced at that time.  He underwent four-vessel CABG 4/20 with good quality distal targets.  Cardiac function remained reduced post bypass.  Unable to close chest due to surrounding edema.  Underwent attempted closure with VAC placement 4/22.   Cardiac arrest 4/23 with repeated washouts with VAC placements on 4/26 and 4/29.  Significant volume overload postoperatively.  Acute kidney injury.  Currently on CRRT for fluid removal.   Sig Events: 08/09/19 Admitted with NSTEM and ? Small RLL PE. Weight 213 pounds.  08/15/19 CABG x4 on 08/15/19 08/18/19 Cardiac arrest due to tamponade. Chest opened for washout 4/26 Chest washout in OR 4/29 CVVH started 5/5 OR for chest washout, tracheostomy 5/6 Trach aspirate with pan sensitive pseudomonas (completed course of abx  5/10 repeat washout, unable to close 5/12 to OR for chest closure with muscle flap  5/13 transitioned to [facility] 5/14 Neurology consulted for left sided weakness- CTH neg 5/20 chest wall swelling > taken to OR for evacuation of hematoma and re-closure.   Micro: 4/14 SARS/ Flu A/B >> neg 4/19 MRSA PCR >> neg 4/22 BCx 2 >> neg 5/2 trach asp >> normal flora  5/2 BCx2 >> neg 5/6 trach asp >> pan sensitive pseudomonas  5/6 BCx2 >> neg 5/10 sternal wound cx >> neg 5/14 BCx 2 >> ngtd  5/14 trach asp >> normal flora   ABX: Cefepime  4/23 >> 5/6 vanc 4/20 >> 4/25;  4/29 >>5/4; 5/6 >>5/13 Fluconazole 5/7 >>5/14 Meropenem 5/7 >> 5/15  Imaging: CTA PE 4/14 >> small RLL PE Echo 4/24: EF 45% with Grade 2 DD [facility] 5/13 and 5/15 >> chronic microvascular ischemia, no acute intracranial abnormality   LDA: L [place] temporary HD line 5/7 >> R midline PIV 5/6 >> shiley trach 5/5 >> cortrak left nare >> Rectal tube >> Wound vac over sternum >>  Interim history/subjective:   Appears comfortable.  Getting ready to work with physical therapy Objective   Blood pressure 132/64, pulse (Abnormal) 103, temperature 98.4 F (36.9 C), resp. rate (Abnormal) 24, height 5\' 9"  (1.753 m), weight 85.9 kg, SpO2 100 %.    FiO2 (%):  [28 %] 28 %   Intake/Output Summary (Last 24 hours) at 09/18/2019 0806 Last data filed at 09/18/2019 0745 Gross per 24 hour  Intake 830 ml  Output 1027 ml  Net -197 ml   Filed Weights   09/17/19 0830 09/17/19 1202 09/18/19 0500  Weight: 87 kg 86.4 kg 85.9 kg    Examination:   General 74 year old black male resting in bed he is in no acute distress this morning HEENT size 8 cuffed tracheostomy.  Tracheostomy currently deflated tracheostomy site unremarkable Pulmonary clear to auscultation diminished currently 28% aerosol trach collar no accessory use Cardiac: Sternal wound, wound VAC in place.  2 JP drains draining for bloody output Abdomen soft tolerating tube feeds GU not making urine Neuro awake, left-sided weakness, but diffusely weak throughout appropriate,  following commands Extremities warm, dry, brisk capillary refill, lower extremity edema Resolved   Cardiac tamponade on 4/23: Status post open chest for washout and clot removal. 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 Plan Continue routine trach care We will ask SLP to see, should be ready to start PMV Pulse ox Titrate oxygen Mobilization  CAD s/p CABG x 4 on 4/20.   HFrEF, EF 30-35% plan As directed by thoracic surgery, and cardiology service Continuing aspirin and atorvastatin Midodrine for blood pressure support Continue amiodarone  Status post evacuation of mediastinal hematoma OR 5/21. Sternal wound vac in place.   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  Plan Per nephrology, continuing Tuesday, Thursday, Saturday schedule  Left sided weakness - evaluated by neurology, CTH x 2 without definite CVA, unable to have MRI given sternal wires Plan Supportive care  DM: has continued to be hyperglycemic plan Continue current insulin regimen   Anemia of critical illness His hemoglobin has been stable since his evacuation of the hematoma Plan Continue to trend CBC  deconditioning related to prolonged ICU stay Plan Continue physical therapy Mobilize as able   Best practice:  Diet: TF at goal  Pain/Anxiety/Delirium protocol (if indicated): PRNs xanax/ dilaudid per primary  VAP protocol (if indicated): in place DVT prophylaxis: heparin gtt GI prophylaxis: PPI Glucose control: SSI, levemir Mobility: BR Code Status: full Family Communication: per primary Disposition: ICU   [name] ACNP-BC [place] Pager # [phone] OR # [phone] if no answer

## 2019-09-18 NOTE — Progress Notes (Signed)
4 Days Post-Op  Subjective: Patient underwent evacuation of mediastinal hematoma on 09/14/19 after large collection noted on exam by HD team on 09/14/19.  Denies any subjective fevers. Reports some midline sternal pain. No other complaints.    Objective: Vital signs in last 24 hours: Temp:  [98 F (36.7 C)-98.5 F (36.9 C)] 98.5 F (36.9 C) (05/23 2341) Pulse Rate:  [94-105] 102 (05/24 0700) Resp:  [0-33] 24 (05/24 0700) BP: (90-142)/(51-96) 132/64 (05/24 0700) SpO2:  [90 %-100 %] 99 % (05/24 0700) FiO2 (%):  [28 %] 28 % (05/24 0337) Weight:  [85.9 kg-87 kg] 85.9 kg (05/24 0500) Last BM Date: 09/17/19  Intake/Output from previous day: 05/23 0701 - 05/24 0700 In: 880 [NG/GT:880] Out: 1027 [Drains:80; Stool:500] Intake/Output this shift: No intake/output data recorded.  General appearance: alert, cooperative and resting in bed Head: Normocephalic, without obvious abnormality, atraumatic Chest wall: wound vac in place, good seal noted. Drains in place - small amount of serosanguinous drainage in bulb. No drainage noted. Extremities: Swelling of BL UE noted, staples in place left forearm. Pulses: 2+  Lab Results:  CBC Latest Ref Rng & Units 09/18/2019 09/17/2019 09/16/2019  WBC 4.0 - 10.5 K/uL 13.1(H) 11.2(H) 10.1  Hemoglobin 13.0 - 17.0 g/dL 8.8(L) 8.3(L) 8.1(L)  Hematocrit 39.0 - 52.0 % 28.1(L) 26.1(L) 25.3(L)  Platelets 150 - 400 K/uL 373 361 318    BMET Recent Labs    09/17/19 0345 09/18/19 0336  NA 138 139  K 4.5 4.0  CL 97* 97*  CO2 26 28  GLUCOSE 173* 62*  BUN 121* 76*  CREATININE 6.91* 4.98*  CALCIUM 8.2* 8.6*   PT/INR No results for input(s): LABPROT, INR in the last 72 hours. ABG No results for input(s): PHART, HCO3 in the last 72 hours.  Invalid input(s): PCO2, PO2  Studies/Results: DG CHEST PORT 1 VIEW  Result Date: 09/18/2019 CLINICAL DATA:  [facility] EXAM: PORTABLE CHEST 1 VIEW COMPARISON:  09/15/2019 FINDINGS: Support devices are stable. Prior  CABG. Cardiomegaly. Worsening bilateral airspace opacities, most confluent in the left lower lobe. No visible effusions or acute bony abnormality. IMPRESSION: Worsening bilateral airspace disease, favor edema. Dense consolidation in the left lower lobe could reflect atelectasis or pneumonia. Electronically Signed   By: Rolm Baptise M.D.   On: 09/18/2019 07:21    Anti-infectives: Anti-infectives (From admission, onward)   Start     Dose/Rate Route Frequency Ordered Stop   09/14/19 0915  vancomycin (VANCOCIN) IVPB 1000 mg/200 mL premix     1,000 mg 200 mL/hr over 60 Minutes Intravenous To Surgery 09/14/19 0907 09/15/19 0915   09/07/19 2000  vancomycin (VANCOREADY) IVPB 750 mg/150 mL     750 mg 150 mL/hr over 60 Minutes Intravenous  Once 09/07/19 1510 09/07/19 2245   09/06/19 2200  meropenem (MERREM) 500 mg in sodium chloride 0.9 % 100 mL IVPB     500 mg 200 mL/hr over 30 Minutes Intravenous Every 24 hours 09/06/19 1542 09/09/19 0015   09/06/19 2000  fluconazole (DIFLUCAN) IVPB 400 mg     400 mg 100 mL/hr over 120 Minutes Intravenous Every 24 hours 09/06/19 1542 09/08/19 2240   09/06/19 1800  vancomycin (VANCOREADY) IVPB 750 mg/150 mL     750 mg 150 mL/hr over 60 Minutes Intravenous  Once 09/06/19 1542 09/07/19 0022   09/06/19 1540  vancomycin variable dose per unstable renal function (pharmacist dosing)      Does not apply See admin instructions 09/06/19 1542 09/08/19 2359   09/06/19 1100  ceFAZolin (ANCEF) IVPB 2g/100 mL premix  Status:  Discontinued     2 g 200 mL/hr over 30 Minutes Intravenous 30 min pre-op 09/05/19 1717 09/06/19 1644   09/06/19 0600  ceFAZolin (ANCEF) IVPB 2g/100 mL premix  Status:  Discontinued     2 g 200 mL/hr over 30 Minutes Intravenous To Short Stay 09/06/19 0546 09/06/19 1644   09/02/19 2200  meropenem (MERREM) 1 g in sodium chloride 0.9 % 100 mL IVPB  Status:  Discontinued     1 g 200 mL/hr over 30 Minutes Intravenous Every 8 hours 09/02/19 1011 09/06/19 1542    09/02/19 2000  fluconazole (DIFLUCAN) IVPB 800 mg  Status:  Discontinued     800 mg 100 mL/hr over 240 Minutes Intravenous Every 24 hours 09/02/19 1011 09/06/19 1542   09/02/19 2000  fluconazole (DIFLUCAN) IVPB 800 mg  Status:  Discontinued     800 mg 200 mL/hr over 120 Minutes Intravenous Every 24 hours 09/02/19 1329 09/02/19 1330   09/02/19 1800  vancomycin (VANCOCIN) IVPB 1000 mg/200 mL premix  Status:  Discontinued     1,000 mg 200 mL/hr over 60 Minutes Intravenous Every 24 hours 09/02/19 1011 09/06/19 1542   09/02/19 0900  meropenem (MERREM) 1 g in sodium chloride 0.9 % 100 mL IVPB  Status:  Discontinued     1 g 200 mL/hr over 30 Minutes Intravenous Every 24 hours 09/01/19 1333 09/02/19 1011   09/01/19 1700  fluconazole (DIFLUCAN) IVPB 800 mg     800 mg 100 mL/hr over 240 Minutes Intravenous  Once 09/01/19 1645 09/01/19 2057   09/01/19 1630  fluconazole (DIFLUCAN) IVPB 400 mg  Status:  Discontinued     400 mg 100 mL/hr over 120 Minutes Intravenous  Once 09/01/19 1629 09/01/19 1645   09/01/19 1330  vancomycin variable dose per unstable renal function (pharmacist dosing)  Status:  Discontinued      Does not apply See admin instructions 09/01/19 1333 09/02/19 1325   09/01/19 0730  meropenem (MERREM) 1 g in sodium chloride 0.9 % 100 mL IVPB  Status:  Discontinued     1 g 200 mL/hr over 30 Minutes Intravenous Every 8 hours 09/01/19 0729 09/01/19 1327   08/31/19 2200  vancomycin (VANCOCIN) IVPB 1000 mg/200 mL premix  Status:  Discontinued     1,000 mg 200 mL/hr over 60 Minutes Intravenous Every 24 hours 08/30/19 2235 09/01/19 1330   08/25/19 0800  vancomycin (VANCOREADY) IVPB 1250 mg/250 mL  Status:  Discontinued     1,250 mg 166.7 mL/hr over 90 Minutes Intravenous Every 24 hours 08/24/19 2121 08/24/19 2128   08/25/19 0100  vancomycin (VANCOREADY) IVPB 1250 mg/250 mL  Status:  Discontinued     1,250 mg 166.7 mL/hr over 90 Minutes Intravenous Every 24 hours 08/24/19 2128 08/30/19 2235    08/24/19 2300  ceFEPIme (MAXIPIME) 2 g in sodium chloride 0.9 % 100 mL IVPB  Status:  Discontinued     2 g 200 mL/hr over 30 Minutes Intravenous Every 12 hours 08/24/19 2150 09/01/19 0729   08/24/19 1338  vancomycin (VANCOCIN) 1,000 mg in sodium chloride 0.9 % 1,000 mL irrigation  Status:  Discontinued       As needed 08/24/19 1338 08/24/19 1508   08/21/19 0000  ceFEPIme (MAXIPIME) 1 g in sodium chloride 0.9 % 100 mL IVPB  Status:  Discontinued     1 g 200 mL/hr over 30 Minutes Intravenous Every 24 hours 08/20/19 0954 08/24/19 2150   08/20/19 1200  vancomycin (VANCOCIN) IVPB 1000 mg/200 mL premix     1,000 mg 200 mL/hr over 60 Minutes Intravenous  Once 08/20/19 0954 08/21/19 0011   08/19/19 2129  vancomycin variable dose per unstable renal function (pharmacist dosing)  Status:  Discontinued      Does not apply See admin instructions 08/19/19 2129 08/24/19 2128   08/19/19 0000  ceFEPIme (MAXIPIME) 2 g in sodium chloride 0.9 % 100 mL IVPB  Status:  Discontinued     2 g 200 mL/hr over 30 Minutes Intravenous Every 24 hours 08/18/19 2351 08/20/19 0954   08/19/19 0000  vancomycin (VANCOREADY) IVPB 2000 mg/400 mL     2,000 mg 200 mL/hr over 120 Minutes Intravenous  Once 08/18/19 2351 08/19/19 0354   08/16/19 1731  vancomycin (VANCOCIN) powder  Status:  Discontinued       As needed 08/16/19 1732 08/16/19 1808   08/15/19 2045  vancomycin (VANCOCIN) IVPB 1000 mg/200 mL premix     1,000 mg 200 mL/hr over 60 Minutes Intravenous  Once 08/15/19 1402 08/15/19 2209   08/15/19 1645  cefUROXime (ZINACEF) 1.5 g in sodium chloride 0.9 % 100 mL IVPB     1.5 g 200 mL/hr over 30 Minutes Intravenous Every 12 hours 08/15/19 1402 08/17/19 0700   08/15/19 0958  vancomycin (VANCOCIN) powder  Status:  Discontinued       As needed 08/15/19 1000 08/15/19 1402   08/15/19 0400  vancomycin (VANCOREADY) IVPB 1500 mg/300 mL     1,500 mg 150 mL/hr over 120 Minutes Intravenous To Surgery 08/14/19 1121 08/15/19 0830    08/15/19 0400  cefUROXime (ZINACEF) 1.5 g in sodium chloride 0.9 % 100 mL IVPB     1.5 g 200 mL/hr over 30 Minutes Intravenous To Surgery 08/14/19 1121 08/15/19 0812   08/15/19 0400  cefUROXime (ZINACEF) 750 mg in sodium chloride 0.9 % 100 mL IVPB     750 mg 200 mL/hr over 30 Minutes Intravenous To Surgery 08/14/19 1121 08/15/19 1300      Assessment/Plan: s/p Procedure(s): EVACUATION OF HEMATOMA  Incisional vac in place - continue to follow Drains in place, continue to follow    LOS: 40 days    Charlies Constable, PA-C 09/18/2019

## 2019-09-18 NOTE — Progress Notes (Signed)
Patient ID: Christian [name], male   DOB: January 12, 1946, 74 y.o.   MRN: [id number] T[facility] Evening Rounds:  Hemodynamically stable in sinus rhythm.  Remains on Trach collar. sats 100%  For tunneled HD catheter tomorrow

## 2019-09-18 NOTE — Progress Notes (Signed)
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 4/23 cardiac arrest, 4/ 29 initiated CRRT, vent s/p trach. 5/10 additional sternal washout unable to close.  5/12 sternal plating with pectoralis flap and omental harvest to mediastinum off CRRT with iHD started. Chest wound closed. Neurology consulted for left sided weakness- CTH neg 09/08/19. 5/20 pt with emergent hematoma evacuation and transition to trach collar. PMHx: BPH, CAD, DM, HLD, HTN   OT comments  Pt progressing to OOB to recliner with stand pivot maxA+2 with knees blocked and modA +1 for bedmobility. Pt performing trunk balance tasks with minguardA to modA. Pt tolerating LUE HEP for elbow through digits. Pt would benefit from continued OT skilled services. OT following acutely. Goals updated. BP sitting EOB 124/62, 110 BPM and 98% O2 on 28% Fio2. BP after exertion 119/86 sitting upright in recliner.    Follow Up Recommendations  CIR;Supervision/Assistance - 24 hour;LTACH    Equipment Recommendations  Other (comment)(defer to next facility)    Recommendations for Other Services Rehab consult    Precautions / Restrictions Precautions Precautions: Fall;Sternal Precaution Comments: trach, sternal flap with VAC, jp drains, flexiseal Restrictions Weight Bearing Restrictions: Yes Other Position/Activity Restrictions: sternal precautions       Mobility Bed Mobility Overal bed mobility: Needs Assistance Bed Mobility: Supine to Sit     Supine to sit: Max assist;HOB elevated     General bed mobility comments: max assist to pivot to right side of bed with cues and assist to move legs and elevate trunk  Transfers Overall transfer level: Needs assistance   Transfers: Sit to/from Stand Sit to Stand: Max assist;+2  physical assistance;From elevated surface         General transfer comment: max +2 physical assist with bil knees blocked and assist of pad at sacrum to stand from elevated bed x 2 trials. Transfer bed to chair via maxisky    Balance Overall balance assessment: Needs assistance Sitting-balance support: Bilateral upper extremity supported Sitting balance-Leahy Scale: Poor Sitting balance - Comments: varying levels of support at EOB mingaurdA to modA due to fatigue.  Postural control: Right lateral lean   Standing balance-Leahy Scale: Zero Standing balance comment: b/l knees blocked;                           ADL either performed or assessed with clinical judgement   ADL Overall ADL's : Needs assistance/impaired Eating/Feeding: NPO Eating/Feeding Details (indicate cue type and reason): cortrak Grooming: Maximal assistance;Sitting Grooming Details (indicate cue type and reason): LUE requiring maxA                             Functional mobility during ADLs: Maximal assistance;+2 for physical assistance;+2 for safety/equipment General ADL Comments: Session focused on bed mobility to EOB and stand pivot to recliner with b/l knees blocked.     Vision   Vision Assessment?: Vision impaired- to be further tested in functional context Eye Alignment: Within Functional Limits Ocular Range of Motion: Within Functional Limits Alignment/Gaze Preference: Within Defined Limits Tracking/Visual Pursuits: Able to track stimulus in all quads without difficulty Additional Comments: vision appears to have improved for scanning    Perception     Praxis  Cognition Arousal/Alertness: Awake/alert Behavior During Therapy: Flat affect Overall Cognitive Status: Difficult to assess                                 General Comments: pt mouthing responses and nodding head, delayed correction to sitting balance cues. Pt reporting back itches         Exercises General Exercises - Upper Extremity Elbow Flexion: AROM;AAROM;Both;10 reps;Seated;Supine Elbow Extension: AAROM;AROM;Both;10 reps;Seated;Supine Wrist Flexion: AROM;Both;10 reps;Supine Wrist Extension: AROM;10 reps;AAROM;Supine Digit Composite Flexion: AROM;Both;10 reps;Seated;Supine Composite Extension: AROM;Both;10 reps;Seated General Exercises - Lower Extremity Long Arc Quad: Seated;Both;5 reps;AAROM Hip Flexion/Marching: Both;Seated;10 reps;AAROM Other Exercises Other Exercises: AROM cervical rotation to L side x5   Shoulder Instructions       General Comments BP sitting EOB 124/62, 110 BPM and 98% O2 on 28% Fio2. BP after exertion 119/86 sitting upright in recliner.    Pertinent Vitals/ Pain       Pain Assessment: 0-10 Pain Score: 5  Pain Location: buttocks Pain Descriptors / Indicators: Discomfort;Grimacing Pain Intervention(s): Limited activity within patient's tolerance  Home Living                                          Prior Functioning/Environment              Frequency  Min 2X/week        Progress Toward Goals  OT Goals(current goals can now be found in the care plan section)  Progress towards OT goals: Progressing toward goals  Acute Rehab OT Goals Patient Stated Goal: return to independence OT Goal Formulation: With patient Time For Goal Achievement: 10/02/19 Potential to Achieve Goals: Fair ADL Goals Pt Will Perform Grooming: with set-up;sitting Pt Will Transfer to Toilet: with mod assist;with +2 assist;squat pivot transfer Pt/caregiver will Perform Home Exercise Program: Increased strength;Increased ROM;Both right and left upper extremity;With written HEP provided Additional ADL Goal #1: Pt will increase to modA for bed mobility as precursor for OOB ADL and mobility. Additional ADL Goal #2: Pt will complete x7 mins of ADL task with 1 rest break in order to increase activity tolerance. Additional ADL Goal #3: Pt  will follow (2) multi step commands with minimal cues using visual perception activities in order to increase awareness of possible visual deficits.  Plan Discharge plan needs to be updated    Co-evaluation    PT/OT/SLP Co-Evaluation/Treatment: Yes Reason for Co-Treatment: Complexity of the patient's impairments (multi-system involvement);To address functional/ADL transfers PT goals addressed during session: Mobility/safety with mobility;Strengthening/ROM OT goals addressed during session: Strengthening/ROM      AM-PAC OT "6 Clicks" Daily Activity     Outcome Measure   Help from another person eating meals?: Total Help from another person taking care of personal grooming?: A Lot Help from another person toileting, which includes using toliet, bedpan, or urinal?: Total Help from another person bathing (including washing, rinsing, drying)?: Total Help from another person to put on and taking off regular upper body clothing?: A Lot Help from another person to put on and taking off regular lower body clothing?: Total 6 Click Score: 8    End of Session Equipment Utilized During Treatment: Oxygen;Gait belt  OT Visit Diagnosis: Unsteadiness on feet (R26.81);Muscle weakness (generalized) (M62.81);Pain Pain - Right/Left: Left Pain - part of body: Arm   Activity Tolerance Patient tolerated treatment  well   Patient Left in chair;with call bell/phone within reach;with chair alarm set;with nursing/sitter in room   Nurse Communication Mobility status        Time: 2574-9355 OT Time Calculation (min): 35 min  Charges: OT General Charges $OT Visit: 1 Visit OT Treatments $Self Care/Home Management : 8-22 mins  Jefferey Pica, OTR/L Acute Rehabilitation Services Pager: [phone] Office: [phone]    Laurey Salser C 09/18/2019, 4:34 PM

## 2019-09-18 NOTE — Consult Note (Signed)
Chief Complaint: Ongoing dialysis access  Referring Physician(s): Dr. Corliss Marcus  Supervising Physician: Corrie Mckusick  Patient Status: [facility] - In-pt  History of Present Illness: Christian [name] is a 74 y.o. male Presented to this facility with worsening dysnpenia found to have a NSTEMI and a RLL PE ands reduced EF s.p CABG on 2.99.37 complicated by cardiac arrest s/p washout on 4.26 and 5.5, tracheostomy 5.5.20 mediastinal exploration on 5.12.20 and evacuation of hematoma on 5.20.21 Found to be in septic shock and AKI. Patient has a left subclavian trialysis catheter placed on 5.7.21 at bedside  that is currently functional. Team is requesting tunneled dialysis catheter for on going access.    Past Medical History:  Diagnosis Date  . BPH (benign prostatic hypertrophy)   . Coronary artery disease   . Diabetes mellitus without complication ([place])   . Dry eyes left  . Hiatal hernia   . 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  LIMA TO LAD, RIMA TO PDA, RADIAL ARTERY TO CIRC AND SEQUENTIALLY TO OM1. 08/15/2019  . Hyperlipidemia   . Hypertension   . Incomplete bladder emptying   . Nocturia   . 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 SOB (shortness of breath) on exertion     Past Surgical History:  Procedure Laterality Date  . APPLICATION OF WOUND VAC N/A 08/24/2019   Procedure: APPLICATION OF WOUND VAC;  Surgeon: [name], MD;  Location: [place];  Service: Thoracic;  Laterality: N/A;  . APPLICATION OF WOUND VAC  08/29/2019   Procedure: Wound Vac change;  Surgeon: [name], MD;  Location: [facility];  Service: Open Heart Surgery;;  . APPLICATION OF WOUND VAC N/A 09/04/2019   Procedure: Application Of Wound Vac;  Surgeon: Grace Silvia, MD;  Location: [place];  Service: Open Heart Surgery;  Laterality: N/A;  . APPLICATION OF WOUND VAC N/A  09/06/2019   Procedure: APPLICATION OF ACELL, APPLICATION OF WOUND VAC USING PREVENA INCISIONAL  DRESSING;  Surgeon: Wallace Going, DO;  Location: [place];  Service: Plastics;  Laterality: N/A;  . CARDIAC CATHETERIZATION    . CORONARY ARTERY BYPASS GRAFT N/A 08/15/2019   Procedure: CORONARY ARTERY BYPASS GRAFTING (CABG), x 4 using bilateral IMAs and left radial artery .  LIMA TO LAD, RIMA TO PDA, RADIAL ARTERY TO CIRC AND SEQUENTIALLY TO OM1.;  Surgeon: [name], MD;  Location: [place];  Service: Open Heart Surgery;  Laterality: N/A;  . CORONARY STENT PLACEMENT  02/27/2014   distal rt/pd coronary       dr Einar Gip  . CYSTO/ BLADDER BIOPSY'S/ CAUTHERIZATION  01-14-2004  DR Gaynelle Arabian  . EXPLORATION POST OPERATIVE OPEN HEART N/A 08/16/2019   Procedure: Chest Closure S?P CABG WITH APPLICATION OF PREVENA  INCISIONAL WOUND VAC;  Surgeon: [name], MD;  Location: [facility];  Service: Open Heart Surgery;  Laterality: N/A;  . EXPLORATION POST OPERATIVE OPEN HEART N/A 08/21/2019   Procedure: CHEST WASHOUT S/P OPEN CHEST;  Surgeon: [name], MD;  Location: [place];  Service: Open Heart Surgery;  Laterality: N/A;  Open chest with Esmark dressing with Ioban sealant coverage.  . EXPLORATION POST OPERATIVE OPEN HEART N/A 08/18/2019   Procedure: EXPLORATION POST OPERATIVE OPEN HEART (performed 04/23 on unit);  Surgeon: [name], MD;  Location: [place];  Service:  Open Heart Surgery;  Laterality: N/A;  . EXPLORATION POST OPERATIVE OPEN HEART N/A 08/24/2019   Procedure: CHEST WASHOUT POST OPERATIVE OPEN HEART;  Surgeon: [name], MD;  Location: [place];  Service: Open Heart Surgery;  Laterality: N/A;  . EXPLORATION POST OPERATIVE OPEN HEART N/A 08/29/2019   Procedure: CHEST WOUND WASHOUT POST OPERATIVE OPEN HEART;  Surgeon: [name], MD;  Location: [place];  Service: Open Heart Surgery;  Laterality: N/A;  . EXPLORATION POST OPERATIVE OPEN HEART N/A 09/04/2019   Procedure:  MEDIASTINAL EXPLORATION WITH STERNAL WOUND IRRIGATION;  Surgeon: Grace Conor, MD;  Location: [place];  Service: Open Heart Surgery;  Laterality: N/A;  . EXPLORATION POST OPERATIVE OPEN HEART N/A 09/14/2019   Procedure: EVACUATION OF HEMATOMA;  Surgeon: Grace Jerrad, MD;  Location: [place];  Service: Open Heart Surgery;  Laterality: N/A;  . LAPAROSCOPIC LYSIS OF ADHESIONS N/A 09/06/2019   Procedure: LAPAROSCOPIC OMENTAL HARVEST;  Surgeon: Kinsinger, Arta Bruce, MD;  Location: [place];  Service: General;  Laterality: N/A;  . LEFT HEART CATH AND CORONARY ANGIOGRAPHY N/A 08/10/2019   Procedure: LEFT HEART CATH AND CORONARY ANGIOGRAPHY;  Surgeon: Nigel Mormon, MD;  Location: [place] [facility];  Service: Cardiovascular;  Laterality: N/A;  . LEFT HEART CATHETERIZATION WITH CORONARY ANGIOGRAM N/A 02/27/2014   Procedure: LEFT HEART CATHETERIZATION WITH CORONARY ANGIOGRAM;  Surgeon: Laverda Page, MD;  Location: [facility] CATH LAB;  Service: Cardiovascular;  Laterality: N/A;  . MEDIASTINAL EXPLORATION N/A 09/06/2019   Procedure: MEDIASTINAL EXPLORATION;  Surgeon: Grace Petros, MD;  Location: [place];  Service: Thoracic;  Laterality: N/A;  . PECTORALIS FLAP  09/06/2019   Procedure: Pectoralis ADVANCEMENT Flap;  Surgeon: Wallace Going, DO;  Location: [place];  Service: Plastics;;  . PERCUTANEOUS CORONARY STENT INTERVENTION (PCI-S)  02/27/2014   Procedure: PERCUTANEOUS CORONARY STENT INTERVENTION (PCI-S);  Surgeon: Laverda Page, MD;  Location: [facility] CATH LAB;  Service: Cardiovascular;;  rt PDA  3.0/28mm Promus stent  . RADIAL ARTERY HARVEST Left 08/15/2019   Procedure: Radial Artery Harvest;  Surgeon: [name], MD;  Location: [place];  Service: Open Heart Surgery;  Laterality: Left;  . RIB PLATING N/A 09/06/2019   Procedure: STERNAL PLATING;  Surgeon: Grace Sharief, MD;  Location: [place];  Service: Thoracic;  Laterality: N/A;  . TEE WITHOUT CARDIOVERSION N/A 08/15/2019   Procedure:  TRANSESOPHAGEAL ECHOCARDIOGRAM (TEE);  Surgeon: [name], MD;  Location: [place];  Service: Open Heart Surgery;  Laterality: N/A;  . TRACHEOSTOMY TUBE PLACEMENT  08/29/2019   Procedure: Tracheostomy;  Surgeon: [name], MD;  Location: [place] OR;  Service: Open Heart Surgery;;  . TRANSURETHRAL RESECTION OF PROSTATE  04/04/2012   Procedure: TRANSURETHRAL RESECTION OF THE PROSTATE WITH GYRUS INSTRUMENTS;  Surgeon: Ailene Rud, MD;  Location: [place];  Service: Urology;  Laterality: N/A;  . TRANSURETHRAL RESECTION OF PROSTATE N/A 09/27/2014   Procedure: TRANSURETHRAL RESECTION OF THE PROSTATE ;  Surgeon: Carolan Clines, MD;  Location: [facility];  Service: Urology;  Laterality: N/A;  . UPPER GASTROINTESTINAL ENDOSCOPY      Allergies: Patient has no known allergies.  Medications: Prior to Admission medications   Medication Sig Start Date End Date Taking? Authorizing Provider  aspirin EC 81 MG tablet Take 81 mg by mouth daily.   Yes [provider]  atorvastatin (LIPITOR) 20 MG tablet TAKE 1 TABLET BY MOUTH EVERY DAY IN THE EVENING Patient taking differently: Take 20 mg  by mouth every evening.  06/22/19  Yes Adrian Prows, MD  cloNIDine (CATAPRES) 0.2 MG tablet TAKE 1 TABLET (0.2 MG TOTAL) BY MOUTH DAILY. 06/22/19  Yes Adrian Prows, MD  cromolyn (OPTICROM) 4 % ophthalmic solution Place 1 drop into both eyes 4 (four) times daily as needed (dry eyes).  02/02/17  Yes [provider]  empagliflozin (JARDIANCE) 10 MG TABS tablet Take 10 mg by mouth daily before breakfast. 07/25/19  Yes Marrian Salvage, FNP  furosemide (LASIX) 20 MG tablet TAKE 1 TABLET BY MOUTH EVERY DAY Patient taking differently: Take 20 mg by mouth daily.  08/01/19  Yes Adrian Prows, MD  isosorbide mononitrate (IMDUR) 60 MG 24 hr tablet Take 60 mg by mouth daily.   Yes [provider]  metFORMIN (GLUCOPHAGE) 1000 MG tablet TAKE 1 TABLET TWICE A DAY WITH A MEAL Patient taking  differently: Take 1,000 mg by mouth 2 (two) times daily with a meal.  07/31/19  Yes Hoyt Koch, MD  metoprolol succinate (TOPROL-XL) 100 MG 24 hr tablet Take 1 tablet (100 mg total) by mouth daily. Take with or immediately following a meal. 07/24/19  Yes Adrian Prows, MD  nitroGLYCERIN (NITROSTAT) 0.4 MG SL tablet Place 1 tablet (0.4 mg total) under the tongue every 5 (five) minutes as needed for chest pain. 06/01/19  Yes Adrian Prows, MD  olmesartan-hydrochlorothiazide (BENICAR HCT) 40-25 MG tablet TAKE 1 TABLET BY MOUTH EVERY DAY Patient taking differently: Take 1 tablet by mouth daily.  07/03/19  Yes Adrian Prows, MD  PREVIDENT 5000 BOOSTER PLUS 1.1 % PSTE Take 1 application by mouth daily.  06/14/18  Yes [provider]  silodosin (RAPAFLO) 8 MG CAPS capsule Take 8 mg by mouth at bedtime. 07/27/19  Yes [provider]  spironolactone (ALDACTONE) 25 MG tablet Take 1 tablet (25 mg total) by mouth daily. 06/01/19 08/30/19 Yes Adrian Prows, MD  sucralfate (CARAFATE) 1 GM/10ML suspension Take 10 mLs (1 g total) by mouth 4 (four) times daily -  with meals and at bedtime. 07/24/19  Yes Marrian Salvage, FNP  tamsulosin [facility]) 0.4 MG CAPS capsule Take 0.4 mg by mouth daily after breakfast.  05/20/18  Yes [provider]  Trospium Chloride 60 MG CP24 Take 1 capsule by mouth daily. 07/31/19  Yes [provider]  verapamil (VERELAN PM) 240 MG 24 hr capsule TAKE 1 CAPSULE BY MOUTH EVERY DAY Patient taking differently: Take 240 mg by mouth daily.  07/24/19  Yes Adrian Prows, MD     Family History  Problem Relation Age of Onset  . Diabetes Mother   . Hypertension Mother   . Diabetes Father   . Diabetes Brother   . Hypertension Brother   . Diabetes Brother     Social History   Socioeconomic History  . Marital status: Married    Spouse name: Not on file  . Number of children: 2  . Years of education: Not on file  . Highest education level: Not on file  Occupational  History  . Not on file  Tobacco Use  . Smoking status: Former Smoker    Packs/day: 0.50    Years: 20.00    Pack years: 10.00    Types: Cigarettes    Quit date: 03/28/1990    Years since quitting: 29.4  . Smokeless tobacco: Never Used  Substance and Sexual Activity  . Alcohol use: Yes    Alcohol/week: 0.0 standard drinks    Comment: 1 beer week rarely  .  Drug use: No  . Sexual activity: Not Currently  Other Topics Concern  . Not on file  Social History Narrative  . Not on file   Social Determinants of Health   Financial Resource Strain:   . Difficulty of Paying Living Expenses:   Food Insecurity:   . Worried About Charity fundraiser in the Last Year:   . Arboriculturist in the Last Year:   Transportation Needs:   . Film/video editor (Medical):   Marland Kitchen Lack of Transportation (Non-Medical):   Physical Activity:   . Days of Exercise per Week:   . Minutes of Exercise per Session:   Stress:   . Feeling of Stress :   Social Connections:   . Frequency of Communication with Friends and Family:   . Frequency of Social Gatherings with Friends and Family:   . Attends Religious Services:   . Active Member of Clubs or Organizations:   . Attends Archivist Meetings:   Marland Kitchen Marital Status:      Review of Systems: A 12 point ROS discussed and pertinent positives are indicated in the HPI above.  All other systems are negative.  Review of Systems  Unable to perform ROS: Acuity of condition    Vital Signs: BP 123/63 (BP Location: Right Arm)   Pulse (!) 105   Temp 98.2 F (36.8 C)   Resp 15   Ht 5\' 9"  (1.753 m)   Wt 189 lb 6 oz (85.9 kg)   SpO2 99%   BMI 27.97 kg/m   Physical Exam Vitals and nursing note reviewed.  Constitutional:      Appearance: He is well-developed.  HENT:     Head: Normocephalic.  Cardiovascular:     Rate and Rhythm: Tachycardia present.  Pulmonary:     Effort: Pulmonary effort is normal.     Breath sounds: Wheezing present.      Comments: Patient trached on O2. Mucus noted in trach.  Musculoskeletal:        General: Normal range of motion.     Cervical back: Normal range of motion.  Skin:    General: Skin is dry.  Neurological:     Mental Status: He is alert.     Imaging: DG Chest 1 View  Result Date: 08/31/2019 CLINICAL DATA:  Intubated, worsening leukocytosis EXAM: CHEST  1 VIEW COMPARISON:  08/29/2019 chest radiograph. FINDINGS: Tracheostomy tube tip overlies the tracheal air column at the thoracic inlet. Enteric tube enters stomach with the tip not seen on this image. Right subclavian central venous catheter terminates over the cavoatrial junction. Right internal jugular central venous sheath terminates in the right brachiocephalic vein. Stable bilateral chest tubes and mediastinal drain. Stable cardiomediastinal silhouette with mild cardiomegaly. No pneumothorax. No pleural effusion. No pulmonary edema. New hazy left lung base opacity. Similar minimal patchy opacity in the peripheral right mid lung. IMPRESSION: 1. Well-positioned support structures. No pneumothorax. 2. New hazy left lung base opacity, favor atelectasis, cannot exclude aspiration or pneumonia. 3. Stable minimal patchy opacity in the peripheral right mid lung. Electronically Signed   By: Ilona Sorrel M.D.   On: 08/31/2019 08:50   DG Chest 1 View  Result Date: 08/24/2019 CLINICAL DATA:  Possible right pneumothorax EXAM: CHEST  1 VIEW COMPARISON:  08/24/2019, 08/22/2019, 08/20/2019 FINDINGS: Endotracheal tube tip approximately 1.9 cm superior to the carina. Esophageal tube tip below the diaphragm but incompletely visualized. Right IJ Swan-Ganz catheter tip projects over pulmonary outflow. New right-sided subclavian  central venous catheter with tip over the cavoatrial region. No definitive right pneumothorax is seen. Bilateral chest drainage catheters and mediastinal drainage catheters. Slightly more curled appearance of the distal right chest tube. Similar  cardiomegaly, left pleural effusion and left basilar airspace disease. There is probable trace right pleural effusion. Right first rib fracture. IMPRESSION: 1. New right subclavian central venous catheter with tip projecting over the cavoatrial region. No pneumothorax is seen. 2. Cardiomegaly with small left effusion and left basilar airspace disease without significant change. There is probable trace right pleural effusion Electronically Signed   By: Donavan Foil M.D.   On: 08/24/2019 18:25   DG Chest 1 View  Result Date: 08/20/2019 CLINICAL DATA:  History of open art surgery. EXAM: CHEST  1 VIEW COMPARISON:  08/19/2019 FINDINGS: Endotracheal tube now at approximately 3.8 cm above the carina retracted since the prior exam. LEFT-sided chest tube and presumed mediastinal drain which projects over the medial chest in similar position. RIGHT-sided chest tube also remains in place entering via inferior approach. RIGHT IJ central venous line with Swan-Ganz catheter in stable position in area of the main pulmonary artery. IJ sheath transmitting this catheter. Defibrillator pad projects over the RIGHT chest as before. Gastric tube coursing through in off the field of the radiograph. Cardiomediastinal contours stable enlarged. Mild increased interstitial markings. Retrocardiac opacification, mild and similar to prior study. Changes of coronary revascularization. Visualized skeletal structures without acute process. IMPRESSION: 1. Endotracheal tube now at approximately 3.8 cm above the carina retracted since the prior study. 2. Additional support lines and tubes as described. 3. Low inspiratory volumes with LEFT greater than RIGHT basilar atelectasis. 4. Persistent retrocardiac opacification. Electronically Signed   By: Zetta Bills M.D.   On: 08/20/2019 14:07   DG Abd 1 View  Result Date: 08/27/2019 CLINICAL DATA:  Possible ileus. EXAM: ABDOMEN - 1 VIEW COMPARISON:  08/18/2019 FINDINGS: Two enteric tubes are  present with both tips just right of midline likely over the distal stomach. Bowel gas pattern is nonobstructive. Air is present throughout the colon. There are a few air-filled nondilated small bowel loops in the right lower quadrant. No free peritoneal air. Remainder the exam is unchanged. IMPRESSION: 1. Nonspecific, nonobstructive bowel gas pattern with a few air-filled nondilated small bowel loops over the right lower quadrant. No free air. 2.  Tubes and lines as described. Electronically Signed   By: Marin Olp M.D.   On: 08/27/2019 09:59   CT HEAD WO CONTRAST  Result Date: 09/09/2019 CLINICAL DATA:  Stroke follow-up EXAM: CT HEAD WITHOUT CONTRAST TECHNIQUE: Contiguous axial images were obtained from the base of the skull through the vertex without intravenous contrast. COMPARISON:  Head CT 09/07/2019 FINDINGS: Brain: There is no mass, hemorrhage or extra-axial collection. There is generalized atrophy without lobar predilection. Hypodensity of the white matter is most commonly associated with chronic microvascular disease. Vascular: No abnormal hyperdensity of the major intracranial arteries or dural venous sinuses. No intracranial atherosclerosis. Skull: The visualized skull base, calvarium and extracranial soft tissues are normal. Sinuses/Orbits: No fluid levels or advanced mucosal thickening of the visualized paranasal sinuses. No mastoid or middle ear effusion. The orbits are normal. IMPRESSION: Chronic microvascular ischemia and generalized atrophy without acute intracranial abnormality. Electronically Signed   By: Ulyses Jarred M.D.   On: 09/09/2019 04:33   CT HEAD WO CONTRAST  Result Date: 09/07/2019 CLINICAL DATA:  Stroke follow-up.  New left-sided paralysis. EXAM: CT HEAD WITHOUT CONTRAST TECHNIQUE: Contiguous axial images were obtained  from the base of the skull through the vertex without intravenous contrast. COMPARISON:  08/26/2019 FINDINGS: Brain: No evidence of acute infarction,  hemorrhage, obstructive hydrocephalus, extra-axial collection or mass lesion/mass effect. Generalized atrophy and ventriculomegaly. Vascular: No hyperdense vessel or unexpected calcification. Skull: Normal. Negative for fracture or focal lesion. Sinuses/Orbits: Negative IMPRESSION: 1. No acute finding.  No visible infarct. 2. Atrophy and ventriculomegaly. Electronically Signed   By: Monte Fantasia M.D.   On: 09/07/2019 09:22   CT HEAD WO CONTRAST  Result Date: 08/26/2019 CLINICAL DATA:  Acute neuro deficit.  Left-sided weakness EXAM: CT HEAD WITHOUT CONTRAST TECHNIQUE: Contiguous axial images were obtained from the base of the skull through the vertex without intravenous contrast. COMPARISON:  MRI head 12/26/2018 FINDINGS: Brain: Generalized atrophy and ventricular enlargement unchanged. Negative for acute infarct, hemorrhage, mass Vascular: Normal arterial flow voids. Skull: Negative Sinuses/Orbits: Air-fluid level in the sphenoid sinus. Bilateral mastoid effusion. NG tube in place. Mild mucosal edema maxillary sinus bilaterally. Other: None IMPRESSION: Generalized atrophy without acute intracranial abnormality. Sinus mucosal disease. Electronically Signed   By: Franchot Gallo M.D.   On: 08/26/2019 22:31   DG CHEST PORT 1 VIEW  Result Date: 09/18/2019 CLINICAL DATA:  [place] EXAM: PORTABLE CHEST 1 VIEW COMPARISON:  09/15/2019 FINDINGS: Support devices are stable. Prior CABG. Cardiomegaly. Worsening bilateral airspace opacities, most confluent in the left lower lobe. No visible effusions or acute bony abnormality. IMPRESSION: Worsening bilateral airspace disease, favor edema. Dense consolidation in the left lower lobe could reflect atelectasis or pneumonia. Electronically Signed   By: Rolm Baptise M.D.   On: 09/18/2019 07:21   DG Chest Port 1 View  Result Date: 09/15/2019 CLINICAL DATA:  Respiratory failure EXAM: PORTABLE CHEST 1 VIEW COMPARISON:  Sep 14, 2019 FINDINGS: Tracheostomy catheter tip is 6.9  cm above the carina. Feeding tube tip is below the diaphragm. Central catheter tip is near the junction of the left innominate vein and superior vena cava, unchanged. A tube is either in or overlying the right hemithorax with the tip in the medial upper right hemithorax, stable. A new tube is either within or overlying the chest from the leftward approach with the tip just to the right of midline in the upper thoracic region. No pneumothorax evident. There is a left pleural effusion with left base atelectasis. The previously noted nodular opacity in the periphery of the right mid lung is stable. Heart remains mildly enlarged, stable, with pulmonary vascularity normal. No adenopathy. IMPRESSION: Tube and catheter positions as described without pneumothorax. Left pleural effusion with left base atelectasis persists. Stable nodular opacity right mid lung. No new opacity evident. Stable cardiac silhouette. Electronically Signed   By: Lowella Grip III M.D.   On: 09/15/2019 08:47   DG CHEST PORT 1 VIEW  Result Date: 09/14/2019 CLINICAL DATA:  Respiratory distress EXAM: PORTABLE CHEST 1 VIEW COMPARISON:  Sep 12, 2019 FINDINGS: Tracheostomy catheter tip is 7.2 cm above the carina. Feeding tube tip is below the diaphragm. Dual lumen catheter has its tip at the junction of the left innominate vein and superior vena cava. There is a catheter on the right with its tip in or overlying the medial right hemithorax, unchanged. No new tube or catheter evident compared to 2 days prior. No pneumothorax. There is a small left pleural effusion with atelectatic change in the left base. Stable nodular opacity in the periphery of the right mid lung measuring 1.4 x 1.3 cm. No new opacity evident. Heart is mildly enlarged with  pulmonary vascularity normal. Postoperative changes noted. No appreciable adenopathy. No bone lesions. IMPRESSION: Tube and catheter positions as described. No pneumothorax. Persistent left pleural effusion  with left base atelectasis. Nodular opacity persists in the periphery of the right lung. No new opacity evident. Stable cardiac silhouette. Postoperative changes noted. Electronically Signed   By: Lowella Grip III M.D.   On: 09/14/2019 08:28   DG Chest Port 1 View  Result Date: 09/12/2019 CLINICAL DATA:  Congestive heart failure. EXAM: PORTABLE CHEST 1 VIEW COMPARISON:  Sep 10, 2019. FINDINGS: Stable cardiomegaly. Tracheostomy and feeding tubes are unchanged in position. Left subclavian catheter is noted with tip in expected position of the SVC. No pneumothorax is noted. Right lung is clear. Mild left basilar atelectasis or infiltrate is noted with associated pleural effusion. Bony thorax is unremarkable. IMPRESSION: Stable support apparatus. Mild left basilar atelectasis or infiltrate is noted with associated pleural effusion. Electronically Signed   By: Marijo Conception M.D.   On: 09/12/2019 09:24   DG Chest Port 1 View  Result Date: 09/10/2019 CLINICAL DATA:  Chest pain. Status post mild cardial infarction. Follow-up exam. EXAM: PORTABLE CHEST 1 VIEW COMPARISON:  09/08/2019 and earlier studies. FINDINGS: Changes from CABG surgery are stable. The cardiac silhouette is mildly enlarged. There is persistent opacity at the left lung base obscuring the hemidiaphragm. Stable nodule in the peripheral right mid lung. Remainder of the lungs is clear. No convincing right pleural effusion. Presumed left pleural effusion. No pneumothorax. Tracheostomy tube, left sided dual lumen central venous catheter and enteric feeding tube are stable. IMPRESSION: 1. No change from the most recent prior study. 2. Persistent left lower lung zone opacity consistent with atelectasis and pleural fluid. 3. Support apparatus is stable. Electronically Signed   By: Lajean Manes M.D.   On: 09/10/2019 09:26   DG Chest Port 1 View  Result Date: 09/08/2019 CLINICAL DATA:  Status post CABG and mediastinal washout. EXAM: PORTABLE CHEST  1 VIEW COMPARISON:  Chest x-ray from yesterday. FINDINGS: Unchanged tracheostomy and feeding tubes. Unchanged right internal jugular and left subclavian central venous catheters. Unchanged right chest tube. Stable cardiomegaly status post CABG. Normal pulmonary vascularity. Unchanged retrocardiac opacity and small left pleural effusion. No pneumothorax. Unchanged 1.4 cm nodule in the peripheral right upper lobe. No acute osseous abnormality. IMPRESSION: 1. Stable left basilar atelectasis and effusion. Electronically Signed   By: Titus Dubin M.D.   On: 09/08/2019 08:05   DG Chest Port 1 View  Result Date: 09/07/2019 CLINICAL DATA:  Tracheostomy tube in place. EXAM: PORTABLE CHEST 1 VIEW COMPARISON:  yesterday FINDINGS: Tracheostomy tube in place. Bilateral central line with tips near the SVC origin. Right chest tube in place. Retrocardiac opacity is unchanged, likely atelectasis and pleural fluid in this setting. Stable postoperative heart size. IMPRESSION: Stable hardware positioning and retrocardiac opacification. Electronically Signed   By: Monte Fantasia M.D.   On: 09/07/2019 08:45   DG Chest Port 1 View  Result Date: 09/06/2019 CLINICAL DATA:  Recent sternotomy revision EXAM: PORTABLE CHEST 1 VIEW COMPARISON:  09/06/2019 FINDINGS: Tracheostomy tube, feeding catheter, right jugular central line left-sided temporary dialysis catheter are noted and stable. New postoperative changes are noted in the midline consistent with the recent sternal revision. Right-sided thoracostomy catheter is noted. No pneumothorax is seen. Mild left basilar atelectasis is noted slightly increased from the prior study. IMPRESSION: Tubes and lines as described above. Recent postsurgical changes. Mild left basilar atelectasis increased from the prior exam. Electronically Signed  By: Inez Catalina M.D.   On: 09/06/2019 19:50   DG CHEST PORT 1 VIEW  Result Date: 09/06/2019 CLINICAL DATA:  Status post CABG. EXAM: PORTABLE  CHEST 1 VIEW COMPARISON:  Chest x-ray dated Sep 04, 2019. FINDINGS: Unchanged tracheostomy and feeding tubes. Unchanged left subclavian central venous catheter and right-sided chest tube. Interval removal of the mediastinal drain with new mediastinal wound VAC. Stable cardiomegaly status post CABG. Normal pulmonary vascularity. Unchanged left basilar atelectasis and small left pleural effusion. Unchanged 1.4 cm nodule in the peripheral right upper lobe. No pneumothorax. No acute osseous abnormality. IMPRESSION: 1. Stable chest. Unchanged left basilar atelectasis and small left pleural effusion. Electronically Signed   By: Titus Dubin M.D.   On: 09/06/2019 08:29   DG Chest Port 1 View  Result Date: 09/04/2019 CLINICAL DATA:  Chest tube EXAM: PORTABLE CHEST 1 VIEW COMPARISON:  09/03/2019 FINDINGS: Support devices including right chest tube remain in place, unchanged. No pneumothorax. Left lower lobe airspace opacity and nodular density in the right mid lung are unchanged. Heart is borderline in size. No visible effusions or acute bony abnormality. IMPRESSION: No significant change since prior study. Electronically Signed   By: Rolm Baptise M.D.   On: 09/04/2019 08:12   DG CHEST PORT 1 VIEW  Result Date: 09/03/2019 CLINICAL DATA:  Respiratory failure. EXAM: PORTABLE CHEST 1 VIEW COMPARISON:  Sep 02, 2019 FINDINGS: Tracheostomy catheter tip is 6.9 cm above the carina. Enteric tube tip is below the diaphragm. Central catheter tip is at the junction of the left innominate vein and superior vena cava. There is a right chest tube and a mediastinal drain. No pneumothorax appreciable. There is persistent consolidation in the left lower lobe with left pleural effusion. Right lung is clear except for pulmonary nodular lesion noted in the periphery of the right mid lung. Heart is enlarged with pulmonary vascularity normal. Status post coronary artery bypass grafting. No adenopathy. No bone lesions. IMPRESSION: Tube and  catheter positions as described without pneumothorax. Persistent airspace consolidation left lower lobe, likely due to pneumonia or potential aspiration with small pleural effusion on the left. Stable nodular opacity right mid lung. No new opacity evident. Stable cardiac silhouette. Electronically Signed   By: Lowella Grip III M.D.   On: 09/03/2019 07:54   DG CHEST PORT 1 VIEW  Result Date: 09/02/2019 CLINICAL DATA:  08/09/19 Admitted with NSTEM and ? Small RLL PE. Weight 213 pounds. 08/15/19 CABG x4 on 08/15/19 08/18/19 Cardiac arrest due to tamponade. Chest opened for washout 4/26 Chest washout in OR 4/29 CVVH started 5/5 OR for chest washout, tracheostomy EXAM: PORTABLE CHEST - 1 VIEW COMPARISON:  the previous day's study FINDINGS: Tracheostomy, left subclavian hemodialysis catheter, feeding tube, mediastinal drains stable. Interval removal of right subclavian central venous catheter. Persistent left basilar consolidation/atelectasis. 1.7 cm nodule laterally in the right mid lung as before. Stable cardiomegaly. Aorticopulmonary window is obscured. Exclude small layering left pleural effusion. No pneumothorax. Visualized bones unremarkable. IMPRESSION: Stable left basilar consolidation/atelectasis and possible small effusion. Electronically Signed   By: Lucrezia Europe M.D.   On: 09/02/2019 08:57   DG CHEST PORT 1 VIEW  Result Date: 09/01/2019 CLINICAL DATA:  Central line placement EXAM: PORTABLE CHEST 1 VIEW COMPARISON:  09/01/2019 FINDINGS: Tracheostomy tube in satisfactory position. Nasogastric tube coursing below the diaphragm. Right subclavian central venous catheter with the tip projecting over the SVC. Left jugular sheath projecting over the confluence of the jugular vein and innominate. Mediastinal drain in unchanged position  Bilateral chest tubes are again noted. No pneumothorax. Small left pleural effusion. Left basilar atelectasis. Ill-defined nodular airspace disease at the periphery of the right mid  lung. Stable cardiomegaly. Status post CABG. No acute osseous abnormality. IMPRESSION: 1. Support lines and tubing in satisfactory position. 2. Small left pleural effusion with left basilar atelectasis. 3. Ill-defined nodular airspace disease at the periphery of the right mid lung. This likely reflects small pulmonary contusion or area of atelectasis, but attention on follow-up examination is recommended. Electronically Signed   By: Kathreen Devoid   On: 09/01/2019 16:44   DG Chest Port 1 View  Result Date: 09/01/2019 CLINICAL DATA:  Status post chest wound evacuation. Tracheostomy present. EXAM: PORTABLE CHEST 1 VIEW COMPARISON:  Aug 31, 2019 FINDINGS: Tracheostomy catheter tip is 6.5 cm above the carina. Feeding tube tip is below the diaphragm. Central catheter tip is in the superior vena cava. There are bilateral chest tubes and a mediastinal drain. No pneumothorax appreciable. There is persistent airspace opacity in the left lower lobe with fairly small left pleural effusion. Ill-defined opacity noted in right mid lung region, stable. Heart is mildly enlarged with pulmonary vascularity normal. Patient is status post coronary artery bypass grafting. No bone lesions. IMPRESSION: Tube and catheter positions as described without evident pneumothorax. Airspace opacity left lower lobe with small left pleural effusion. Suspect atelectasis in this area with potential superimposed pneumonia or possible aspiration. Ill-defined opacity in the periphery the right mid lung remains, likely of infectious etiology. No new opacity evident. Stable cardiomegaly. Electronically Signed   By: Lowella Grip III M.D.   On: 09/01/2019 08:26   DG CHEST PORT 1 VIEW  Result Date: 08/29/2019 CLINICAL DATA:  ET tube and chest tube. Status post CABG EXAM: PORTABLE CHEST 1 VIEW COMPARISON:  08/29/2019 FINDINGS: ET tube tip is above the carina. The right IJ catheter sheath projects over the SVC. Right subclavian central venous catheter  tip is at the cavoatrial junction. There is a feeding tube and nasogastric tube in place. Bilateral chest tubes in place without evidence for pneumothorax. Cardiac enlargement. No pleural effusion. No airspace densities identified. IMPRESSION: 1. Satisfactory position of support apparatus. 2. No pneumothorax. 3. Cardiac enlargement. Electronically Signed   By: Kerby Moors M.D.   On: 08/29/2019 09:11   DG CHEST PORT 1 VIEW  Result Date: 08/27/2019 CLINICAL DATA:  Possible ileus. EXAM: PORTABLE CHEST 1 VIEW COMPARISON:  08/26/2019 FINDINGS: Endotracheal tube has tip 3 cm above the carina. Two enteric tubes course into the stomach and off the film as tip not visualized. Right subclavian central venous catheter and right IJ central venous sheath unchanged. Bilateral chest tubes unchanged. Lungs are adequately inflated with mild hazy left base opacification unchanged and possibly due to atelectasis and less likely infection. Improved hazy prominence of the right infrahilar vessels. No pneumothorax. Stable cardiomegaly. Remainder of the exam is unchanged. IMPRESSION: 1. Stable left base opacification likely atelectasis and less likely infection. Stable cardiomegaly. 2.  Tubes and lines as described. Electronically Signed   By: Marin Olp M.D.   On: 08/27/2019 09:58   DG Chest Port 1 View  Result Date: 08/26/2019 CLINICAL DATA:  Post open heart surgery. EXAM: PORTABLE CHEST 1 VIEW COMPARISON:  08/25/2019 FINDINGS: Endotracheal tube has tip 1.2 cm above the carina. Enteric tube courses into the region of the stomach and off the film as tip is not visualized. Right IJ central venous sheath with tip over the SVC unchanged. Right subclavian central venous catheter  with tip over the SVC unchanged. Mediastinal drain and bilateral chest tubes unchanged. Lungs are adequately inflated with minimal linear density over the left midlung likely atelectasis. Minimal bibasilar density likely atelectasis. No pneumothorax. Mild  improved cardiomegaly. Remainder of the exam is unchanged. IMPRESSION: 1.  Minimal bibasilar density likely atelectasis.  No pneumothorax. 2.  Tubes and lines as described. Electronically Signed   By: Marin Olp M.D.   On: 08/26/2019 09:19   DG CHEST PORT 1 VIEW  Result Date: 08/25/2019 CLINICAL DATA:  Status post CABG. EXAM: PORTABLE CHEST 1 VIEW COMPARISON:  08/24/2019 and prior radiographs FINDINGS: Cardiomegaly and CABG changes are again noted. An endotracheal tube with tip 3 cm above the carina, RIGHT subclavian central venous catheter with tip overlying the LOWER SVC, small bore feeding tube entering the stomach with tip off field of view and mediastinal and thoracostomy tubes again noted. A RIGHT IJ since Swan-Ganz catheter has been removed with sheath remaining. There is no evidence of pneumothorax. Bibasilar atelectasis again noted, LEFT-greater-than-RIGHT. IMPRESSION: Unchanged appearance of the chest except for removal of RIGHT IJ Swan-Ganz catheter. Electronically Signed   By: Margarette Canada M.D.   On: 08/25/2019 09:12   DG CHEST PORT 1 VIEW  Result Date: 08/24/2019 CLINICAL DATA:  Acute respiratory failure. Endotracheally intubated. Congestive heart failure. Postop from open heart surgery. EXAM: PORTABLE CHEST 1 VIEW COMPARISON:  08/22/2019 FINDINGS: Support lines and tubes remain in stable position. Bilateral chest tubes are again seen, and there is no evidence of pneumothorax or pleural effusion. Decreased left lower lobe atelectasis is demonstrated since prior study. Right lung remains clear. Stable cardiomegaly. Fracture of right posterior 1st rib noted. IMPRESSION: Decreased left lower lobe atelectasis. No pneumothorax visualized. Electronically Signed   By: Marlaine Hind M.D.   On: 08/24/2019 09:25   DG CHEST PORT 1 VIEW  Result Date: 08/22/2019 CLINICAL DATA:  CHF.  CABG. EXAM: PORTABLE CHEST 1 VIEW COMPARISON:  08/20/2019 FINDINGS: Endotracheal tube 15 mm above the carina. NG tube  removed. Feeding tube is in place with the tip not visualized. Swan-Ganz catheter in the left main pulmonary artery. Bilateral chest tubes in place. Pericardial drain in place. No pneumothorax. Progression of left lower lobe atelectasis.  No edema. IMPRESSION: Endotracheal tube 15 mm above the carina. Bilateral chest tubes in place.  No pneumothorax. Progression of left lower lobe atelectasis. Electronically Signed   By: Franchot Gallo M.D.   On: 08/22/2019 09:22   DG Chest Port 1V same Day  Result Date: 08/22/2019 CLINICAL DATA:  Check endotracheal tube placement EXAM: PORTABLE CHEST 1 VIEW COMPARISON:  08/22/2018 FINDINGS: Endotracheal tube is noted 1.7 cm above the carina. Feeding catheter is noted extending into the distal stomach. Bilateral chest tube, pericardial drain and mediastinal drain are noted. No pneumothorax is seen. No focal infiltrate is noted. Mild left basilar atelectasis is again seen. Swan-Ganz catheter is noted within the left pulmonary artery. No bony abnormality is seen. IMPRESSION: Left basilar atelectasis. Tubes and lines as described above stable from the prior exam. Electronically Signed   By: Inez Catalina M.D.   On: 08/22/2019 19:25   DG Abd Portable 1V  Result Date: 08/30/2019 CLINICAL DATA:  Feeding tube placement. EXAM: PORTABLE ABDOMEN - 1 VIEW COMPARISON:  08/28/2019 FINDINGS: 0855 hours. The tip of the feeding tube is in the distal duodenum near the ligament of Treitz. Diffuse gaseous bowel distension again noted. IMPRESSION: Feeding tube tip is in the distal duodenum. Electronically Signed   By:  Misty Stanley M.D.   On: 08/30/2019 10:31   DG Abd Portable 1V  Result Date: 08/28/2019 CLINICAL DATA:  Feeding tube placement. EXAM: PORTABLE ABDOMEN - 1 VIEW COMPARISON:  Radiographs 08/27/2019 FINDINGS: 1210 hours. The feeding tube has been advanced, and now follows a course consistent with tip in the 3rd portion of the duodenum. A nasogastric tube extends to the distal  stomach. Left chest tube and mediastinal drains are in place. Mild gaseous distension of the bowel, a small left pleural effusion and left basilar atelectasis appear unchanged. IMPRESSION: Feeding tube tip projects to the level of the 3rd portion of the duodenum. Electronically Signed   By: Richardean Sale M.D.   On: 08/28/2019 12:17   [place] EKG SITE RITE  Result Date: 08/31/2019 If Site Rite image not attached, placement could not be confirmed due to current cardiac rhythm.   Labs:  CBC: Recent Labs    09/15/19 0329 09/16/19 0438 09/17/19 0345 09/18/19 0336  WBC 9.8 10.1 11.2* 13.1*  HGB 9.0* 8.1* 8.3* 8.8*  HCT 27.2* 25.3* 26.1* 28.1*  PLT 302 318 361 373    COAGS: Recent Labs    08/18/19 2002 08/19/19 0300 08/19/19 1820 09/04/19 0358 09/09/19 0621 09/10/19 0453 09/11/19 0208 09/12/19 0510 09/14/19 0805  INR 1.4* 1.5* 1.3*  --   --   --   --   --  1.2  APTT  --   --   --    < > 36 37* 42* 39*  --    < > = values in this interval not displayed.    BMP: Recent Labs    09/15/19 0329 09/16/19 0438 09/17/19 0345 09/18/19 0336  NA 137 135 138 139  K 4.4 3.9 4.5 4.0  CL 96* 94* 97* 97*  CO2 24 26 26 28   GLUCOSE 249* 191* 173* 62*  BUN 110* 82* 121* 76*  CALCIUM 7.9* 8.0* 8.2* 8.6*  CREATININE 7.59* 5.38* 6.91* 4.98*  GFRNONAA 6* 10* 7* 11*  GFRAA 7* 11* 8* 12*    LIVER FUNCTION TESTS: Recent Labs    08/23/19 0552 08/23/19 0552 08/24/19 0405 08/25/19 0326 09/02/19 0311 09/02/19 1530 09/07/19 0223 09/07/19 0707 09/15/19 0329 09/16/19 0438 09/17/19 0345 09/18/19 0336  BILITOT 0.8  --  0.7  --  1.0  --  1.1  --   --   --   --   --   AST 50*  --  59*  --  38  --  72*  --   --   --   --   --   ALT 220*  --  169*  --  49*  --  127*  --   --   --   --   --   ALKPHOS 82  --  115  --  104  --  193*  --   --   --   --   --   PROT 4.2*  --  4.3*  --  5.1*  --  5.5*  --   --   --   --   --   ALBUMIN 1.5*   < > 1.6*   < > 2.1*   < > 2.2*   < > 1.8* 1.9* 2.0*  2.1*   < > = values in this interval not displayed.    Assessment and Plan:  74 y.o, male inpatient. Presented to this facility with worsening dysnpenia found to have a NSTEMI and a RLL PE ands  reduced EF s.p CABG on 5.83.09 complicated by cardiac arrest s/p washout on 4.26 and 5.5, tracheostomy 5.5.20 mediastinal exploration on 5.12.20 and evacuation of hematoma on 5.20.21 Found to be in septic shock and AKI. Patient has a left subclavian trialysis catheter placed on 5.7.21 at bedside  that is currently functional. Team is requesting tunneled dialysis catheter for on going access.  Pertinent Imaging 5.24.21 - CT Chest  Pertinent IR History none  Pertinent Allergies NKDA  WBC is 13.1, Cr 4.98, BUN 76  All labs and medications are within acceptable parameters. Blood cultures from 5.20.21 negative X 4 days.  IR consulted for possible Tunneled HD catheter placement. Case has been reviewed and procedure approved.  Patient tentatively scheduled for 5.25.21.  Team instructed to: Keep Patient to be NPO after midnight   IR will call patient when ready.     Risks and benefits discussed with the patient and the patient's wife including, but not limited to bleeding, infection, vascular injury, pneumothorax which may require chest tube placement, air embolism or even death  All of the patient's and the patient's wife's questions were answered, patient is agreeable to proceed. Consent signed and in chart.      Thank you for this interesting consult.  I greatly enjoyed meeting KEAHI MCCARNEY and look forward to participating in their care.  A copy of this report was sent to the requesting provider on this date.  Electronically Signed: Avel Peace, NP 09/18/2019, 3:17 PM   I spent a total of 40 Minutes    in face to face in clinical consultation, greater than 50% of which was counseling/coordinating care for tunneled HD cathrter placement

## 2019-09-18 NOTE — Significant Event (Signed)
Received a call from IR that patient's spouse does not want to consent for HD catheter scheduled today until she speaks with Dr. Orvan Seen first.   RN spoke with patient's spouse to confirm and connected her with Dr. Orvan Seen over the phone. She is verbalizing agreement now. Updated IR team.    Kathaleen Grinder

## 2019-09-19 ENCOUNTER — Inpatient Hospital Stay (HOSPITAL_COMMUNITY): Payer: Medicare Other

## 2019-09-19 HISTORY — PX: IR US GUIDE VASC ACCESS LEFT: IMG2389

## 2019-09-19 HISTORY — PX: IR FLUORO GUIDE CV LINE LEFT: IMG2282

## 2019-09-19 LAB — MAGNESIUM: Magnesium: 2.4 mg/dL (ref 1.7–2.4)

## 2019-09-19 LAB — RENAL FUNCTION PANEL
Albumin: 2.2 g/dL — ABNORMAL LOW (ref 3.5–5.0)
Anion gap: 14 (ref 5–15)
BUN: 114 mg/dL — ABNORMAL HIGH (ref 8–23)
CO2: 25 mmol/L (ref 22–32)
Calcium: 8.8 mg/dL — ABNORMAL LOW (ref 8.9–10.3)
Chloride: 97 mmol/L — ABNORMAL LOW (ref 98–111)
Creatinine, Ser: 6.52 mg/dL — ABNORMAL HIGH (ref 0.61–1.24)
GFR calc Af Amer: 9 mL/min — ABNORMAL LOW (ref >60)
GFR calc non Af Amer: 8 mL/min — ABNORMAL LOW (ref >60)
Glucose, Bld: 132 mg/dL — ABNORMAL HIGH (ref 70–99)
Phosphorus: 6.9 mg/dL — ABNORMAL HIGH (ref 2.5–4.6)
Potassium: 4.9 mmol/L (ref 3.5–5.1)
Sodium: 136 mmol/L (ref 135–145)

## 2019-09-19 LAB — CBC
HCT: 28.6 % — ABNORMAL LOW (ref 39.0–52.0)
Hemoglobin: 9 g/dL — ABNORMAL LOW (ref 13.0–17.0)
MCH: 31.1 pg (ref 26.0–34.0)
MCHC: 31.5 g/dL (ref 30.0–36.0)
MCV: 99 fL (ref 80.0–100.0)
Platelets: 406 10*3/uL — ABNORMAL HIGH (ref 150–400)
RBC: 2.89 MIL/uL — ABNORMAL LOW (ref 4.22–5.81)
RDW: 17.2 % — ABNORMAL HIGH (ref 11.5–15.5)
WBC: 13.4 10*3/uL — ABNORMAL HIGH (ref 4.0–10.5)
nRBC: 0.1 % (ref 0.0–0.2)

## 2019-09-19 LAB — GLUCOSE, CAPILLARY
Glucose-Capillary: 112 mg/dL — ABNORMAL HIGH (ref 70–99)
Glucose-Capillary: 117 mg/dL — ABNORMAL HIGH (ref 70–99)
Glucose-Capillary: 126 mg/dL — ABNORMAL HIGH (ref 70–99)
Glucose-Capillary: 149 mg/dL — ABNORMAL HIGH (ref 70–99)
Glucose-Capillary: 189 mg/dL — ABNORMAL HIGH (ref 70–99)
Glucose-Capillary: 99 mg/dL (ref 70–99)

## 2019-09-19 LAB — AEROBIC/ANAEROBIC CULTURE W GRAM STAIN (SURGICAL/DEEP WOUND): Culture: NO GROWTH

## 2019-09-19 MED ORDER — HEPARIN SODIUM (PORCINE) 1000 UNIT/ML DIALYSIS
1000.0000 [IU] | INTRAMUSCULAR | Status: DC | PRN
  Filled 2019-09-19: qty 1

## 2019-09-19 MED ORDER — SODIUM CHLORIDE 0.9 % IV SOLN
INTRAVENOUS | Status: AC | PRN
  Administered 2019-09-19: 10 mL/h via INTRAVENOUS

## 2019-09-19 MED ORDER — SODIUM CHLORIDE 0.9 % IV SOLN: 100.0000 mL | INTRAVENOUS | Status: DC | PRN

## 2019-09-19 MED ORDER — CEFAZOLIN SODIUM-DEXTROSE 1-4 GM/50ML-% IV SOLN: 1.0000 g | Freq: Once | INTRAVENOUS | Status: DC

## 2019-09-19 MED ORDER — HEPARIN SODIUM (PORCINE) 1000 UNIT/ML IJ SOLN
INTRAMUSCULAR | Status: AC
  Administered 2019-09-19: 2800 [IU]
  Filled 2019-09-19: qty 4

## 2019-09-19 MED ORDER — FENTANYL CITRATE (PF) 100 MCG/2ML IJ SOLN
INTRAMUSCULAR | Status: AC
  Filled 2019-09-19: qty 2

## 2019-09-19 MED ORDER — CHLORHEXIDINE GLUCONATE 4 % EX LIQD
CUTANEOUS | Status: AC
  Filled 2019-09-19: qty 15

## 2019-09-19 MED ORDER — CEFAZOLIN SODIUM-DEXTROSE 2-4 GM/100ML-% IV SOLN
2.0000 g | Freq: Once | INTRAVENOUS | Status: AC
  Administered 2019-09-19: 2 g via INTRAVENOUS

## 2019-09-19 MED ORDER — HEPARIN SODIUM (PORCINE) 1000 UNIT/ML IJ SOLN
INTRAMUSCULAR | Status: AC | PRN
  Administered 2019-09-19: 4.2 mL via INTRAVENOUS

## 2019-09-19 MED ORDER — MIDAZOLAM HCL 2 MG/2ML IJ SOLN
INTRAMUSCULAR | Status: AC | PRN
  Administered 2019-09-19: 1 mg via INTRAVENOUS
  Administered 2019-09-19: 0.5 mg via INTRAVENOUS

## 2019-09-19 MED ORDER — MIDAZOLAM HCL 2 MG/2ML IJ SOLN
INTRAMUSCULAR | Status: AC
  Filled 2019-09-19: qty 2

## 2019-09-19 MED ORDER — ALBUMIN HUMAN 25 % IV SOLN
INTRAVENOUS | Status: AC
  Administered 2019-09-19: 12.5 g
  Filled 2019-09-19: qty 100

## 2019-09-19 MED ORDER — ALTEPLASE 2 MG IJ SOLR: 2.0000 mg | Freq: Once | INTRAMUSCULAR | Status: DC | PRN

## 2019-09-19 MED ORDER — FENTANYL CITRATE (PF) 100 MCG/2ML IJ SOLN
INTRAMUSCULAR | Status: AC | PRN
  Administered 2019-09-19: 25 ug via INTRAVENOUS
  Administered 2019-09-19: 50 ug via INTRAVENOUS

## 2019-09-19 MED ORDER — LIDOCAINE-PRILOCAINE 2.5-2.5 % EX CREA
1.0000 "application " | TOPICAL_CREAM | CUTANEOUS | Status: DC | PRN
  Filled 2019-09-19: qty 5

## 2019-09-19 MED ORDER — CEFAZOLIN SODIUM-DEXTROSE 2-4 GM/100ML-% IV SOLN
INTRAVENOUS | Status: AC
  Filled 2019-09-19: qty 100

## 2019-09-19 MED ORDER — ALBUMIN HUMAN 25 % IV SOLN
12.5000 g | Freq: Once | INTRAVENOUS | Status: AC
  Administered 2019-09-21: 25 g via INTRAVENOUS

## 2019-09-19 MED ORDER — LIDOCAINE-EPINEPHRINE 1 %-1:100000 IJ SOLN
INTRAMUSCULAR | Status: AC
  Filled 2019-09-19: qty 1

## 2019-09-19 MED ORDER — LIDOCAINE HCL (PF) 1 % IJ SOLN: 5.0000 mL | INTRAMUSCULAR | Status: DC | PRN

## 2019-09-19 MED ORDER — HEPARIN SODIUM (PORCINE) 1000 UNIT/ML IJ SOLN
INTRAMUSCULAR | Status: AC
  Filled 2019-09-19: qty 1

## 2019-09-19 MED ORDER — LIDOCAINE-EPINEPHRINE 1 %-1:100000 IJ SOLN
INTRAMUSCULAR | Status: AC | PRN
  Administered 2019-09-19: 10 mL

## 2019-09-19 MED ORDER — PENTAFLUOROPROP-TETRAFLUOROETH EX AERO
1.0000 "application " | INHALATION_SPRAY | CUTANEOUS | Status: DC | PRN
  Filled 2019-09-19: qty 116

## 2019-09-19 MED FILL — Heparin Sodium (Porcine) Inj 1000 Unit/ML: INTRAMUSCULAR | Qty: 30 | Status: AC

## 2019-09-19 MED FILL — Sodium Chloride IV Soln 0.9%: INTRAVENOUS | Qty: 3000 | Status: AC

## 2019-09-19 NOTE — Progress Notes (Signed)
Tried to waste 56mcg Fentanyl in Pyxis. Message kept saying undocumented waste of 1.60ml. I gave the patient 72mcg from a 162mcg vial and documented same. Called pharmacy and they recommended I put a note in the patients chart reguarding this. I wasted with P Dhers RN and my supervisor Sunday Corn was also present and aware. I also left a note in the pyxis.

## 2019-09-19 NOTE — Progress Notes (Signed)
Inpatient Rehab Admissions:  Inpatient Rehab Consult received.  I met with pt and his wife at the bedside for rehabilitation assessment. Pt asleep and would only open his eyes briefly to verbal and tactile stimulation. Pt's wife reports he just finished dialysis and is tired. I introduced myself to the wife and explained my role and reason for visit. We discussed CIR as one of the recommended post acute programs and how CIR differed from [facility] and this acute stay. Based on medical acuity and debility, feel pt would benefit from CIR if he can show an increase in participation as his medical work up progresses. Wife appears to agree with CIR program but also understands his candidacy will depend on his medical status and tolerance once work up completed. She states she can do some light physical assistance at DC but does have concerns about trach care, feeding tubes, and his wound vac. We discussed that if pt were to go home with that equipment we would train her on how to managed it but that its also too early to tell what his DC functional level would be. Provided pt's wife with brochures regarding the program and plan to continue to follow up with her and her husband to help determine the most appropriate post acute placement when he is ready.   Raechel Ache, OTR/L  Rehab Admissions Coordinator  [phone] 09/19/2019 1:17 PM

## 2019-09-19 NOTE — Progress Notes (Signed)
Attempted to call wife on both cell and home numbers to update on Pts return to unit after procedure.  Was unable to make contact with her.

## 2019-09-19 NOTE — Procedures (Signed)
Pre-procedure Diagnosis: ESRD Post-procedure Diagnosis: Same  Successful placement of L IJ approach tunneled HD catheter with tips terminating within the superior aspect of the right atrium.    Successful removal of temporary L EJ approach HD catheter.   Complications: None Immediate EBL: Trace  The catheter is ready for immediate use.   Ronny Bacon, MD Pager #: [phone]

## 2019-09-19 NOTE — Progress Notes (Signed)
Chaplain engaged in follow-up visit with Christian [name], who was sleeping, and his wife, Christian [name].  During visit, Christian [name] expressed feeling tired.  She has had a lot on her plate in terms of caring for her husband and getting things also done outside of the hospital.  She has been dedicated to not worrying through it all.  Her motto is to take things one item at a time and if it gets done, it gets done it will be there tomorrow.  Chaplain offered ministries of presence and listening as they discussed the idea and theological concept of worrying.  Chaplain offered support and let her know she was available to assist in the ways she can.  Chaplain affirmed how hard it can be to be a caregiver.  Chaplain will follow-up.

## 2019-09-19 NOTE — Progress Notes (Signed)
Christian [name] Progress Note     Assessment/ Plan:    1. AKI- (crt 1.22 on 07/24/19) following cardiac cath, cabg, cardiogenic shock, cardiac arrest due to tamponade, and acute on chronic CHF. Started CRRT on 08/24/19-5/12 due to extensive volume overload preventing chest closure.  1. On TTS schedule this week, successful HD today with UF 2L, next will be due 5/27; may end up needing extra treatment for ultrafiltration but if we can make progress with TIW I'd prefer that.  2. RIJ TDC has been ordered for IR, appreicate placing - scheduled for today. 3. Remains anuric- Unclear if recovery is possible given major insult->bladder scan 5/17 (50m)(no foley).  2. CAD s/p CABG x 4 complicated by cardiac tamponade s/p reopened chest urgently on 08/18/19 with wound vac.most recent OR On 5/12and then 5/20 (evacuation of blood in chest) 3. Acute systolic heart failure due to ischemic cardiomyopathy- volume markedly improved with CRRT. Will cont UF w/ HD since tolerating and no pressors 4. Postoperative acute hypoxic respiratory failure- reintubated 08/18/19 and likely aspiration. Now trach 5. Anemia- Hgbstable in 8s > 9. transfuse prn.On ESA- darbe 150 6. Elytes-phosphorus is improved with dialysis but had to add renvela to TF  Subjective:   Tolerated HD today with UF 2L.  TDC placement this afternoon.  Wife at bedside today.  Mr. MCampillois sleepy today and hasn't really spoken with her today.   Objective:   BP 126/66   Pulse (!) 104   Temp 98.6 F (37 C) (Oral)   Resp (!) 23   Ht 5' 9"  (1.753 m)   Wt 82.7 kg   SpO2 98%   BMI 26.92 kg/m   Intake/Output Summary (Last 24 hours) at 09/19/2019 1308 Last data filed at 09/19/2019 1008 Gross per 24 hour  Intake 708.17 ml  Output 2066 ml  Net -1357.83 ml   Weight change: -2.4 kg  Physical Exam: Gen: Drowsy CVS: Tachycardic Resp: coarse BS anteriorly, on TC Ext: diffuse pitting edema throughout GU: no foley Access: Lt  SCVtemp (5/7)  Imaging: CT CHEST WO CONTRAST  Result Date: 09/18/2019 CLINICAL DATA:  Chest pain, shortness of breath, pleural effusion at, history of coronary artery disease post CABG, diabetes mellitus, hypertension EXAM: CT CHEST WITHOUT CONTRAST TECHNIQUE: Multidetector CT imaging of the chest was performed following the standard protocol without IV contrast. Sagittal and coronal MPR images reconstructed from axial data set. COMPARISON:  08/09/2019 FINDINGS: Cardiovascular: Atherosclerotic calcifications aorta and coronary arteries. Postsurgical changes of CABG. LEFT central venous catheter with tip at SVC. Enlargement of cardiac chambers. Minimal pericardial effusion. Aorta normal caliber. Mediastinum/Nodes: Tip of tracheostomy tube above carina. Nasogastric tube extends into stomach. Two surgical drains in the anterior chest wall. Esophagus unremarkable. 6 mm RIGHT thyroid nodule; BILATERAL pleural effusions and scattered atelectasis greatest in LEFT lower lobe. No definite infiltrate or pneumothorax. (Ref: J Am Coll Radiol. 2015 Feb;12(2): 143-50). Base of cervical region otherwise normal appearance. Stranding of the anterior mediastinum likely related to prior cardiac surgery and sternal dehiscence, with herniation of fat through the sternotomy. No thoracic adenopathy. Lungs/Pleura: BILATERAL pleural effusions, small. Scattered atelectasis in both lungs greatest LEFT lower lobe. Pleural calcifications in hemithoraces bilaterally likely reflects asbestos exposure. No infiltrate or pneumothorax. Upper Abdomen: Diffuse somewhat nodular thickening of the adrenal glands bilaterally likely representing multiple small nodules question adenomas as noted on prior exam. High attenuation material within the gallbladder question vicarious excretion of prior contrast. Hepatic margins appear slightly irregular raising question of cirrhosis Musculoskeletal:  No additional osseous findings. Diffuse stranding  identified along the RIGHT pectoralis major and minor muscles which could be related to prior surgery, hemorrhage, or infection. No soft tissue gas. IMPRESSION: BILATERAL pleural effusions and scattered atelectasis greatest in LEFT lower lobe. Pleural calcifications in hemithoraces bilaterally question asbestos exposure. Stranding of the anterior mediastinum likely related to prior cardiac surgery and sternal dehiscence, with herniation of fat through the sternotomy. Diffuse stranding along the RIGHT pectoralis major and minor muscles which could be related to prior surgery, hemorrhage, or infection. 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 Minimally irregular hepatic margins cannot exclude cirrhosis. Aortic Atherosclerosis (ICD10-I70.0). Electronically Signed   By: Lavonia Dana M.D.   On: 09/18/2019 15:27   DG CHEST PORT 1 VIEW  Result Date: 09/18/2019 CLINICAL DATA:  Rhonchi EXAM: PORTABLE CHEST 1 VIEW COMPARISON:  09/15/2019 FINDINGS: Support devices are stable. Prior CABG. Cardiomegaly. Worsening bilateral airspace opacities, most confluent in the left lower lobe. No visible effusions or acute bony abnormality. IMPRESSION: Worsening bilateral airspace disease, favor edema. Dense consolidation in the left lower lobe could reflect atelectasis or pneumonia. Electronically Signed   By: Rolm Baptise M.D.   On: 09/18/2019 07:21    Labs: BMET Recent Labs  Lab 09/13/19 0531 09/13/19 0531 09/14/19 0225 09/14/19 0225 09/14/19 1800 09/15/19 0009 09/15/19 0329 09/16/19 0438 09/17/19 0345 09/18/19 0336 09/19/19 0312  NA 135   < > 133*   < > 133* 136 137 135 138 139 136  K 4.8   < > 5.4*   < > 5.6* 4.5 4.4 3.9 4.5 4.0 4.9  CL 95*   < > 96*   < > 97* 97* 96* 94* 97* 97* 97*  CO2 24   < > 20*   < > 22 21* 24 26 26 28 25   GLUCOSE 118*   < > 196*   < > 265* 292* 249* 191* 173* 62* 132*  BUN 62*   < > 96*   < > 97* 109* 110* 82* 121* 76* 114*  CREATININE 5.16*   < > 6.83*   < > 6.78* 7.46* 7.59* 5.38* 6.91* 4.98*  6.52*  CALCIUM 8.4*   < > 8.3*   < > 7.8* 8.2* 7.9* 8.0* 8.2* 8.6* 8.8*  PHOS 5.5*  --  7.5*  --   --   --  9.2* 5.7* 6.6* 5.3* 6.9*   < > = values in this interval not displayed.   CBC Recent Labs  Lab 09/14/19 0805 09/14/19 1425 09/15/19 0329 09/15/19 0329 09/16/19 0438 09/17/19 0345 09/18/19 0336 09/19/19 0312  WBC 13.4*   < > 9.8   < > 10.1 11.2* 13.1* 13.4*  NEUTROABS 8.9*  --  6.9  --  6.7 7.3  --   --   HGB 6.1*   < > 9.0*   < > 8.1* 8.3* 8.8* 9.0*  HCT 20.6*   < > 27.2*   < > 25.3* 26.1* 28.1* 28.6*  MCV 101.5*   < > 95.1   < > 97.3 97.8 98.6 99.0  PLT 393   < > 302   < > 318 361 373 406*   < > = values in this interval not displayed.    Medications:    .  stroke: mapping our early stages of recovery book   Does not apply Once  . sodium chloride   Intravenous Once  . sodium chloride   Intravenous Once  . sodium chloride   Intravenous Once  . alteplase  2  mg Intracatheter Once  . amiodarone  200 mg Per Tube BID  . arformoterol  15 mcg Nebulization BID  . aspirin  81 mg Per Tube Daily  . atorvastatin  40 mg Per Tube Daily  . bisacodyl  10 mg Rectal Daily  . chlorhexidine gluconate (MEDLINE KIT)  15 mL Mouth Rinse BID  . Chlorhexidine Gluconate Cloth  6 each Topical Q0600  . clonazePAM  0.5 mg Per Tube BID  . darbepoetin (ARANESP) injection - NON-DIALYSIS  150 mcg Subcutaneous Q Mon-1800  . feeding supplement (PRO-STAT SUGAR FREE 64)  60 mL Per Tube TID  . insulin aspart  0-15 Units Subcutaneous Q4H  . insulin detemir  10 Units Subcutaneous BID  . mouth rinse  15 mL Mouth Rinse 10 times per day  . midodrine  10 mg Per Tube TID WC  . multivitamin  1 tablet Oral QHS  . oxyCODONE  5 mg Per Tube Q6H  . pantoprazole sodium  40 mg Per Tube Daily  . revefenacin  175 mcg Nebulization Daily  . sertraline  25 mg Per Tube Daily  . sevelamer carbonate  0.8 g Per Tube TID WC  . sodium chloride flush  10-40 mL Intracatheter Q12H  . sodium chloride flush  10-40 mL  Intracatheter Q12H  . trospium  20 mg Per Tube BID    Jannifer Hick MD [facility] Pager [phone]

## 2019-09-19 NOTE — Progress Notes (Signed)
  Speech Language Pathology Treatment: Christian [name] Speaking valve  Patient Details Name: Christian [name] MRN: [id number] DOB: November 22, 1945 Today's Date: 09/19/2019 Time: 1152-1202 SLP Time Calculation (min) (ACUTE ONLY): 10 min  Assessment / Plan / Recommendation Clinical Impression  Brief session with pt today due to his lethargy s/p HD. Wife, Christian [name], present and talking with Christian [name] from CIR.  Cuff confirmed to be deflated, PMV placed, and verbal/tactile stimulation provided in order to engage pt and facilitate participation.  Pt opened eyes, did not attempt to vocalize despite repeated efforts to elicit speech.  Intermittent removal of valve revealed no backflow; VS remained stable; pt accessing upper airway for natural exhalation despite no attempts to vocalize.  D/W wife; encourage staff to use PMV when pt can be fully supervised.  SLP will continue efforts to work with pt.    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 4/23 cardiac arrest, 4/29 initiated CRRT, vent s/p trach. 5/10 additional sternal washout unable to close.  5/12 sternal plating with pectoralis flap and omental harvest to mediastinum off CRRT with iHD started. Chest wound closed. Neurology consulted for left sided weakness- CTH neg 09/08/19. 5/20 pt with emergent hematoma evacuation and transition to trach collar; 5/24 downsized to #6 cuffed trach. PMHx: BPH, CAD, DM, HLD, HTN      SLP Plan  Continue with current plan of care       Recommendations         Patient may use Passy-Muir Speech Valve: During all therapies with supervision PMSV Supervision: Full         Oral Care Recommendations: Oral care QID SLP Visit Diagnosis: Aphonia (R49.1) Plan: Continue with current plan of care       GO                Christian [name] 09/19/2019, 1:10 PM  Christian [name] L. Tivis Ringer, [place] Office number  [phone] Pager [phone]

## 2019-09-19 NOTE — Progress Notes (Signed)
NAME:  Christian [name], MRN:  [id number], DOB:  [birth year], LOS: 51 ADMISSION DATE:  08/09/2019, CONSULTATION DATE:  08/28/2019 REFERRING MD:  Orvan Seen - TRH, CHIEF COMPLAINT:  Respiratory failure.    HPI/course in hospital   74 year old man who presented with progressive dyspnea.  He was admitted 4/14 and was incidentally found to have a right lower lobe pulmonary embolism.  Overall the picture was that of decompensated heart failure with a non-STEMI.  Coronary angiography revealed triple-vessel disease and the patient was referred for cardiac surgery.  EF was found to be reduced at that time.  He underwent four-vessel CABG 4/20 with good quality distal targets.  Cardiac function remained reduced post bypass.  Unable to close chest due to surrounding edema.  Underwent attempted closure with VAC placement 4/22.   Cardiac arrest 4/23 with repeated washouts with VAC placements on 4/26 and 4/29.  Significant volume overload postoperatively.  Acute kidney injury.  Currently on CRRT for fluid removal.   Sig Events: 08/09/19 Admitted with NSTEM and ? Small RLL PE. Weight 213 pounds.  08/15/19 CABG x4 on 08/15/19 08/18/19 Cardiac arrest due to tamponade. Chest opened for washout 4/26 Chest washout in OR 4/29 CVVH started 5/5 OR for chest washout, tracheostomy 5/6 Trach aspirate with pan sensitive pseudomonas (completed course of abx  5/10 repeat washout, unable to close 5/12 to OR for chest closure with muscle flap  5/13 transitioned to [facility] 5/14 Neurology consulted for left sided weakness- CTH neg 5/20 chest wall swelling > taken to OR for evacuation of hematoma and re-closure.  5/24 down sized to 6 trach  Micro: 4/14 SARS/ Flu A/B >> neg 4/19 MRSA PCR >> neg 4/22 BCx 2 >> neg 5/2 trach asp >> normal flora  5/2 BCx2 >> neg 5/6 trach asp >> pan sensitive pseudomonas  5/6 BCx2 >> neg 5/10 sternal wound cx >> neg 5/14 BCx 2 >> ngtd  5/14 trach asp >> normal flora   ABX: Cefepime  4/23  >> 5/6 vanc 4/20 >> 4/25; 4/29 >>5/4; 5/6 >>5/13 Fluconazole 5/7 >>5/14 Meropenem 5/7 >> 5/15  Imaging: CTA PE 4/14 >> small RLL PE Echo 4/24: EF 45% with Grade 2 DD [facility] 5/13 and 5/15 >> chronic microvascular ischemia, no acute intracranial abnormality  CT chest 5/24: BILATERAL pleural effusions and scattered atelectasis greatest in LEFT lower lobe.Pleural calcifications in hemithoraces bilaterally question asbestos exposure.Stranding of the anterior mediastinum likely related to prior cardiac surgery and sternal dehiscence, with herniation of fat through the sternotomy.Diffuse stranding along the RIGHT pectoralis major and minor muscles which could be related to prior surgery, hemorrhage, or infection. 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   LDA: L [place] temporary HD line 5/7 >> R midline PIV 5/6 >> shiley trach 5/5 >> cortrak left nare >> Rectal tube >> Wound vac over sternum >>  Interim history/subjective:   Comfortable  Objective   Blood pressure 108/61, pulse (Abnormal) 106, temperature 97.8 F (36.6 C), temperature source Oral, resp. rate 19, height 5\' 9"  (1.753 m), weight 84.6 kg, SpO2 100 %.    FiO2 (%):  [28 %] 28 %   Intake/Output Summary (Last 24 hours) at 09/19/2019 0833 Last data filed at 09/19/2019 0538 Gross per 24 hour  Intake 708.17 ml  Output 166 ml  Net 542.17 ml   Filed Weights   09/18/19 0500 09/19/19 0500 09/19/19 0658  Weight: 85.9 kg 84.6 kg 84.6 kg    Examination:   General this is a 74 year old black male.  Resting in bed. Currently getting iHD. Tolerating this well from symptom stand-point but has required IV albumin for Hypotension HENT #6 cuffed trach is unremarkable. Cuff deflated.  Pulm 28% ATC no accessory use. Decreased bases Card RRR sternal wound vac in place. JP drains w/ scant output abd tol tubefeeds Neuro awake. Withdrawn no distress. Generalized weakness GU anuric  Resolved   Cardiac tamponade on 4/23: Status post open chest for washout  and clot removal. 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 -downsized to  6 yesterday  Plan Cont routine trach care SLP working w/ him now cont to encourage PMV as tolerated.  Mobilize   CAD s/p CABG x 4 on 4/20.   HFrEF, EF 30-35% plan As directed by CVTS and cards Cont asa and atorvastatin  Cont amiodarone  Cont tele   Status post evacuation of mediastinal hematoma OR 5/21. Sternal wound vac in place.   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 Adjusted glycemic regimen per recs of DM coordinator  plan Cont ssi and current levemir at 10. If glycemic control becomes a issue we will add tubefeed coverage first   Anemia of critical illness His hemoglobin has been stable since his evacuation of the hematoma Plan Trending CBC   deconditioning related to prolonged ICU stay Plan Cont PT OOB as able  Will likely need LTAC level care at dc    Best practice:  Diet: TF at goal  Pain/Anxiety/Delirium protocol (if indicated): PRNs xanax/ dilaudid per primary  VAP protocol (if indicated): in place DVT prophylaxis: heparin gtt GI prophylaxis: PPI Glucose control: SSI, levemir Mobility: BR Code Status:  full Family Communication: per primary Disposition: ICU   Christian [name] ACNP-BC [place] Pager # [phone] OR # [phone] if no answer

## 2019-09-20 LAB — RENAL FUNCTION PANEL
Albumin: 2.5 g/dL — ABNORMAL LOW (ref 3.5–5.0)
Anion gap: 12 (ref 5–15)
BUN: 69 mg/dL — ABNORMAL HIGH (ref 8–23)
CO2: 26 mmol/L (ref 22–32)
Calcium: 8.7 mg/dL — ABNORMAL LOW (ref 8.9–10.3)
Chloride: 98 mmol/L (ref 98–111)
Creatinine, Ser: 4.93 mg/dL — ABNORMAL HIGH (ref 0.61–1.24)
GFR calc Af Amer: 13 mL/min — ABNORMAL LOW (ref >60)
GFR calc non Af Amer: 11 mL/min — ABNORMAL LOW (ref >60)
Glucose, Bld: 173 mg/dL — ABNORMAL HIGH (ref 70–99)
Phosphorus: 5.5 mg/dL — ABNORMAL HIGH (ref 2.5–4.6)
Potassium: 4.6 mmol/L (ref 3.5–5.1)
Sodium: 136 mmol/L (ref 135–145)

## 2019-09-20 LAB — MAGNESIUM: Magnesium: 2.2 mg/dL (ref 1.7–2.4)

## 2019-09-20 LAB — GLUCOSE, CAPILLARY
Glucose-Capillary: 159 mg/dL — ABNORMAL HIGH (ref 70–99)
Glucose-Capillary: 176 mg/dL — ABNORMAL HIGH (ref 70–99)
Glucose-Capillary: 198 mg/dL — ABNORMAL HIGH (ref 70–99)
Glucose-Capillary: 117 mg/dL — ABNORMAL HIGH (ref 70–99)
Glucose-Capillary: 159 mg/dL — ABNORMAL HIGH (ref 70–99)

## 2019-09-20 MED ORDER — CHLORHEXIDINE GLUCONATE 0.12 % MT SOLN
OROMUCOSAL | Status: AC
  Filled 2019-09-20: qty 15

## 2019-09-20 MED ORDER — NEPRO/CARBSTEADY PO LIQD
1000.0000 mL | ORAL | Status: DC
  Administered 2019-09-20 – 2019-10-03 (×9): 1000 mL via ORAL
  Filled 2019-09-20 (×12): qty 1000

## 2019-09-20 MED ORDER — MIDODRINE HCL 5 MG PO TABS
5.0000 mg | ORAL_TABLET | Freq: Three times a day (TID) | ORAL | Status: DC
  Administered 2019-09-20 – 2019-10-09 (×38): 5 mg
  Filled 2019-09-20 (×40): qty 1

## 2019-09-20 NOTE — Progress Notes (Signed)
NAME:  Christian [name], MRN:  [id number], DOB:  11-18-1945, LOS: 58 ADMISSION DATE:  08/09/2019, CONSULTATION DATE:  08/28/2019 REFERRING MD:  Orvan Seen - TRH, CHIEF COMPLAINT:  Respiratory failure.    HPI/course in hospital   74 year old man who presented with progressive dyspnea.  He was admitted 4/14 and was incidentally found to have a right lower lobe pulmonary embolism.  Overall the picture was that of decompensated heart failure with a non-STEMI.  Coronary angiography revealed triple-vessel disease and the patient was referred for cardiac surgery.  EF was found to be reduced at that time.  He underwent four-vessel CABG 4/20 with good quality distal targets.  Cardiac function remained reduced post bypass.  Unable to close chest due to surrounding edema.  Underwent attempted closure with VAC placement 4/22.   Cardiac arrest 4/23 with repeated washouts with VAC placements on 4/26 and 4/29.  Significant volume overload postoperatively.  Acute kidney injury.  Currently on CRRT for fluid removal.   Sig Events: 08/09/19 Admitted with NSTEM and ? Small RLL PE. Weight 213 pounds.  08/15/19 CABG x4 on 08/15/19 08/18/19 Cardiac arrest due to tamponade. Chest opened for washout 4/26 Chest washout in OR 4/29 CVVH started 5/5 OR for chest washout, tracheostomy 5/6 Trach aspirate with pan sensitive pseudomonas (completed course of abx  5/10 repeat washout, unable to close 5/12 to OR for chest closure with muscle flap  5/13 transitioned to [facility] 5/14 Neurology consulted for left sided weakness- CTH neg 5/20 chest wall swelling > taken to OR for evacuation of hematoma and re-closure.  5/24 down sized to 6 trach  Micro: 4/14 SARS/ Flu A/B >> neg 4/19 MRSA PCR >> neg 4/22 BCx 2 >> neg 5/2 trach asp >> normal flora  5/2 BCx2 >> neg 5/6 trach asp >> pan sensitive pseudomonas  5/6 BCx2 >> neg 5/10 sternal wound cx >> neg 5/14 BCx 2 >> ngtd  5/14 trach asp >> normal flora   ABX: Cefepime  4/23  >> 5/6 vanc 4/20 >> 4/25; 4/29 >>5/4; 5/6 >>5/13 Fluconazole 5/7 >>5/14 Meropenem 5/7 >> 5/15  Imaging: CTA PE 4/14 >> small RLL PE Echo 4/24: EF 45% with Grade 2 DD [facility] 5/13 and 5/15 >> chronic microvascular ischemia, no acute intracranial abnormality  CT chest 5/24: BILATERAL pleural effusions and scattered atelectasis greatest in LEFT lower lobe.Pleural calcifications in hemithoraces bilaterally question asbestos exposure.Stranding of the anterior mediastinum likely related to prior cardiac surgery and sternal dehiscence, with herniation of fat through the sternotomy.Diffuse stranding along the RIGHT pectoralis major and minor muscles which could be related to prior surgery, hemorrhage, or infection. 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   LDA: L [place] temporary HD line 5/7 >> R midline PIV 5/6 >> shiley trach 5/5 >> cortrak left nare >> Rectal tube >> Wound vac over sternum >>  Interim history/subjective:   Comfortable  Objective   Blood pressure (!) 150/72, pulse 99, temperature 98.1 F (36.7 C), temperature source Oral, resp. rate 15, height 5\' 9"  (1.753 m), weight 84.6 kg, SpO2 100 %.    FiO2 (%):  [28 %] 28 %   Intake/Output Summary (Last 24 hours) at 09/20/2019 0753 Last data filed at 09/20/2019 0600 Gross per 24 hour  Intake 6372.5 ml  Output 2185 ml  Net 4187.5 ml   Filed Weights   09/19/19 0658 09/19/19 1008 09/20/19 0605  Weight: 84.6 kg 82.7 kg 84.6 kg    Examination:  GEN: elderly man in NAD HEENT: Trach in place with  minimal secretions CV: ext warm, heart sounds distant with wound vac in place, R subclavian fossa residual hematoma stable but TTP PULM: Diminished at bases, no wheezing GI: Soft, +BS EXT: Trivial edema NEURO: Profoundly weak but moves all 4 ext PSYCH: RASS 0, flat affect SKIN: No rashes, new tunneled catheter CDI  Resolved   Cardiac tamponade on 4/23: Status post open chest for washout and clot removal. 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  Restrictive chest physiology status post sternotomy, poor chest wall compliance.  Baseline PFTs with mixed restrictive and obstructive defect, FEV1 50% predicted prior to surgery. Downsized to cuffed shiley 6 5/24 -Cont routine trach care -Continue nebs as ordered -SLP working w/ him now cont to encourage PMV as tolerated.  -Mobilize, PT is paramount  DM- continue levemir and SSI  Afib- unclear when [facility] can be started  The patient is critically ill with multiple organ systems failure and requires high complexity decision making for assessment and support, frequent evaluation and titration of therapies, application of advanced monitoring technologies and extensive interpretation of multiple databases. Critical Care Time devoted to patient care services described in this note independent of APP/resident time (if applicable)  is 32 minutes.   Erskine Emery MD [facility] 09/20/2019 7:59 AM Personal pager: [phone] If unanswered, please page CCM On-call: [phone]

## 2019-09-20 NOTE — Progress Notes (Signed)
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 4/23 cardiac arrest, 4/ 29 initiated CRRT, vent s/p trach. 5/10 additional sternal washout unable to close.  5/12 sternal plating with pectoralis flap and omental harvest to mediastinum off CRRT with iHD started. Chest wound closed. Neurology consulted for left sided weakness- CTH neg 09/08/19. 5/20 pt with emergent hematoma evacuation and transition to trach collar. 5/24 down sized to 6 trach. PMHx: BPH, CAD, DM, HLD, HTN    PT Comments    Pt maxisky over to recliner today +2 for safety. Pt able to roll x2, while maintaining sternal precautions max(A). Attempted to stand x1 max (A) +2 physical assistance. Pt unable to extend trunk and LE to fully stand. Pt required verbal and tactile cues throughout session for all functional mobility. Pt demonstrated excessive left lateral lean and required max(A) +2 to position to midline and was left propped up at midline with pillows, nursing aware. Will continue to follow acutely until d/c to next venue of care.  Pre session-97 HR, 100 spo2, 164/79 BP Post session 101 HR, 100 spo2, 168/75 BP   Follow Up Recommendations  LTACH;Supervision/Assistance - 24 hour;CIR (pending progression with activity tolerance)     Equipment Recommendations  Other (comment)(TBD)    Recommendations for Other Services       Precautions / Restrictions Precautions Precautions: Fall;Sternal Precaution Comments: trach, sternal flap, jp drains, flexiseal Restrictions Weight Bearing Restrictions: Yes RUE Weight Bearing: Non weight bearing RUE Partial Weight Bearing Percentage or Pounds: Sternal Precautions LUE Weight Bearing: Non weight bearing LUE Partial Weight Bearing Percentage or Pounds: Sternal  Precautions Other Position/Activity Restrictions: sternal precautions    Mobility  Bed Mobility Overal bed mobility: Needs Assistance Bed Mobility: Rolling;Supine to Sit Rolling: Max assist;+2 for safety/equipment         General bed mobility comments: Rolled x2 while maintaining precautions to put maxisky lift pad under pt  Transfers Overall transfer level: Needs assistance   Transfers: Sit to/from Stand Sit to Stand: Max assist;+2 physical assistance         General transfer comment: Maxisky moved pt from supine in bed to recliner.  Attempted to sit to stand x1, pt was unable to stand all the way, required verbal and tactile cues for movement.  Ambulation/Gait             General Gait Details: unable   Stairs             Wheelchair Mobility    Modified Rankin (Stroke Patients Only)       Balance Overall balance assessment: Needs assistance Sitting-balance support: Bilateral upper extremity supported Sitting balance-Leahy Scale: Poor Sitting balance - Comments: Pt unable to sit up without left sided lean, pt required pillows to position trunk centrally. Postural control: Left lateral lean   Standing balance-Leahy Scale: Zero Standing balance comment: b/l knees blocked, assistance with chuck pad for pelvic elevation                            Cognition Arousal/Alertness: Awake/alert Behavior During Therapy: Flat affect Overall Cognitive Status: Difficult to assess Area of Impairment: Orientation;Problem solving;Following commands  Following Commands: Follows one step commands consistently;Follows one step commands with increased time;Follows multi-step commands inconsistently     Problem Solving: Slow processing;Decreased initiation;Requires verbal cues;Requires tactile cues General Comments: pt mouthing responses and nodding head. Pt required verbal and tactile cues for all mobility.      Exercises  Other Exercises Other Exercises: x3 attempted to activate core to correct left lateral lean Other Exercises: Attempted cervical extension stretch x3, pt unable to complete    General Comments General comments (skin integrity, edema, etc.): Pt vitals remained stable during session, pt motivated to sit up in recliner. Attempted therex in recliner, limited due to unable to follow commands due to [place].      Pertinent Vitals/Pain Pain Assessment: Faces Faces Pain Scale: Hurts little more Pain Location: R shoulder Pain Descriptors / Indicators: Discomfort;Grimacing Pain Intervention(s): Limited activity within patient's tolerance;Monitored during session    Home Living                      Prior Function            PT Goals (current goals can now be found in the care plan section) Acute Rehab PT Goals Patient Stated Goal: return to independence PT Goal Formulation: With patient Time For Goal Achievement: 09/26/19 Potential to Achieve Goals: Fair Progress towards PT goals: Progressing toward goals    Frequency    Min 3X/week      PT Plan Current plan remains appropriate    Co-evaluation              AM-PAC PT "6 Clicks" Mobility   Outcome Measure  Help needed turning from your back to your side while in a flat bed without using bedrails?: A Lot Help needed moving from lying on your back to sitting on the side of a flat bed without using bedrails?: Total Help needed moving to and from a bed to a chair (including a wheelchair)?: Total Help needed standing up from a chair using your arms (e.g., wheelchair or bedside chair)?: Total Help needed to walk in hospital room?: Total Help needed climbing 3-5 steps with a railing? : Total 6 Click Score: 7    End of Session Equipment Utilized During Treatment: Oxygen Activity Tolerance: Patient tolerated treatment well Patient left: in chair;with call bell/phone within reach Nurse Communication: Mobility  status;Need for lift equipment PT Visit Diagnosis: Other abnormalities of gait and mobility (R26.89);Muscle weakness (generalized) (M62.81);Other symptoms and signs involving the nervous system (R29.898)     Time: 8937-3428 PT Time Calculation (min) (ACUTE ONLY): 40 min  Charges:  $Therapeutic Exercise: 8-22 mins $Therapeutic Activity: 23-37 mins                     [employer] SPT 09/20/2019    Rolland Porter 09/20/2019, 12:05 PM

## 2019-09-20 NOTE — Progress Notes (Signed)
6 Days Post-Op  Subjective: Resting in bedside chair today.  JP drain output over last 24 hours: 25 and 35 cc.    Objective: Vital signs in last 24 hours: Temp:  [98 F (36.7 C)-99.4 F (37.4 C)] 98.1 F (36.7 C) (05/26 0732) Pulse Rate:  [96-110] 100 (05/26 0800) Resp:  [15-26] 21 (05/26 0800) BP: (100-161)/(58-115) 137/61 (05/26 0800) SpO2:  [97 %-100 %] 100 % (05/26 0800) FiO2 (%):  [28 %] 28 % (05/26 0737) Weight:  [82.7 kg-84.6 kg] 84.6 kg (05/26 0605) Last BM Date: 09/19/19  Intake/Output from previous day: 05/25 0701 - 05/26 0700 In: 6372.5 [NG/GT:6372.5] Out: 2185 [Drains:60; Stool:125] Intake/Output this shift: Total I/O In: 100 [I.V.:100] Out: -   General appearance: alert, cooperative, no distress, and sitting in bedside chair Head: Normocephalic, without obvious abnormality, ng tube in place. Neck: trach collar in place Chest wall: mild right sided tenderness, no fluid collections noted, B/L JP drains in place. Bulging tissue noted along right anterior shoulder, no fluid wave noted, no fluctuance noted. Midline sternal incision is c/d/I with the exception of everted edges/exposed tissue at the inferior aspect. No dehiscence noted. Wound vac removed today by nursing staff.  Extremities: Swelling of bilateral UE, staples in place along left distal forearm.   Lab Results:  CBC Latest Ref Rng & Units 09/19/2019 09/18/2019 09/17/2019  WBC 4.0 - 10.5 K/uL 13.4(H) 13.1(H) 11.2(H)  Hemoglobin 13.0 - 17.0 g/dL 9.0(L) 8.8(L) 8.3(L)  Hematocrit 39.0 - 52.0 % 28.6(L) 28.1(L) 26.1(L)  Platelets 150 - 400 K/uL 406(H) 373 361    BMET Recent Labs    09/19/19 0312 09/20/19 0333  NA 136 136  K 4.9 4.6  CL 97* 98  CO2 25 26  GLUCOSE 132* 173*  BUN 114* 69*  CREATININE 6.52* 4.93*  CALCIUM 8.8* 8.7*   PT/INR No results for input(s): LABPROT, INR in the last 72 hours. ABG No results for input(s): PHART, HCO3 in the last 72 hours.  Invalid input(s): PCO2,  PO2  Studies/Results: CT CHEST WO CONTRAST  Result Date: 09/18/2019 CLINICAL DATA:  Chest pain, shortness of breath, pleural effusion at, history of coronary artery disease post CABG, diabetes mellitus, hypertension EXAM: CT CHEST WITHOUT CONTRAST TECHNIQUE: Multidetector CT imaging of the chest was performed following the standard protocol without IV contrast. Sagittal and coronal MPR images reconstructed from axial data set. COMPARISON:  08/09/2019 FINDINGS: Cardiovascular: Atherosclerotic calcifications aorta and coronary arteries. Postsurgical changes of CABG. LEFT central venous catheter with tip at SVC. Enlargement of cardiac chambers. Minimal pericardial effusion. Aorta normal caliber. Mediastinum/Nodes: Tip of tracheostomy tube above carina. Nasogastric tube extends into stomach. Two surgical drains in the anterior chest wall. Esophagus unremarkable. 6 mm RIGHT thyroid nodule; BILATERAL pleural effusions and scattered atelectasis greatest in LEFT lower lobe. No definite infiltrate or pneumothorax. (Ref: J Am Coll Radiol. 2015 Feb;12(2): 143-50). Base of cervical region otherwise normal appearance. Stranding of the anterior mediastinum likely related to prior cardiac surgery and sternal dehiscence, with herniation of fat through the sternotomy. No thoracic adenopathy. Lungs/Pleura: BILATERAL pleural effusions, small. Scattered atelectasis in both lungs greatest LEFT lower lobe. Pleural calcifications in hemithoraces bilaterally likely reflects asbestos exposure. No infiltrate or pneumothorax. Upper Abdomen: Diffuse somewhat nodular thickening of the adrenal glands bilaterally likely representing multiple small nodules question adenomas as noted on prior exam. High attenuation material within the gallbladder question vicarious excretion of prior contrast. Hepatic margins appear slightly irregular raising question of cirrhosis Musculoskeletal: No additional osseous findings. Diffuse stranding  identified  along the RIGHT pectoralis major and minor muscles which could be related to prior surgery, hemorrhage, or infection. No soft tissue gas. IMPRESSION: BILATERAL pleural effusions and scattered atelectasis greatest in LEFT lower lobe. Pleural calcifications in hemithoraces bilaterally question asbestos exposure. Stranding of the anterior mediastinum likely related to prior cardiac surgery and sternal dehiscence, with herniation of fat through the sternotomy. Diffuse stranding along the RIGHT pectoralis major and minor muscles which could be related to prior surgery, hemorrhage, or infection. 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 Minimally irregular hepatic margins cannot exclude cirrhosis. Aortic Atherosclerosis (ICD10-I70.0). Electronically Signed   By: Lavonia Dana M.D.   On: 09/18/2019 15:27   IR Fluoro Guide CV Line Left  Result Date: 09/19/2019 INDICATION: End-stage renal disease. In need of durable intravenous access for continuation dialysis. Note, patient has existing left external jugular vein approach temporary dialysis catheter. Patient currently with open median sternotomy with subcutaneous surgical drains involving the ventral aspect of the chest bilaterally, the right-side of which courses regional to the expected subcutaneous track of a right chest dialysis catheter. Additionally, there is a skin defect/potential developing abscess involving the ventral aspect of the right upper chest, at the location of the standard entrance site of a right internal jugular approach dialysis catheter. As such, we will proceed with image guided placement of a left internal jugular approach dialysis catheter and removal of the temporary left external jugular approach dialysis catheter. EXAM: TUNNELED CENTRAL VENOUS HEMODIALYSIS CATHETER PLACEMENT WITH ULTRASOUND AND FLUOROSCOPIC GUIDANCE MEDICATIONS: Ancef 2 gm IV . The antibiotic was given in an appropriate time interval prior to skin puncture. ANESTHESIA/SEDATION: Moderate  (conscious) sedation was employed during this procedure. A total of Versed 1.5 mg and Fentanyl 75 mcg was administered intravenously. Moderate Sedation Time: 17 minutes. The patient's level of consciousness and vital signs were monitored continuously by radiology nursing throughout the procedure under my direct supervision. FLUOROSCOPY TIME:  1 minute, 6 seconds (11 mGy) COMPLICATIONS: None immediate. PROCEDURE: Informed written consent was obtained from the patient's family after a discussion of the risks, benefits, and alternatives to treatment. Questions regarding the procedure were encouraged and answered. The left neck and chest as well as the external portion of the existing left external jugular approach dialysis catheter were prepped with chlorhexidine in a sterile fashion, and a sterile drape was applied covering the operative field. Maximum barrier sterile technique with sterile gowns and gloves were used for the procedure. A timeout was performed prior to the initiation of the procedure. After creating a small venotomy incision, a micropuncture kit was utilized to access the internal jugular vein. Real-time ultrasound guidance was utilized for vascular access including the acquisition of a permanent ultrasound image documenting patency of the accessed vessel. The microwire was utilized to measure appropriate catheter length. A stiff Glidewire was advanced to the level of the IVC and the micropuncture sheath was exchanged for a peel-away sheath. A palindrome tunneled hemodialysis catheter measuring 28 cm from tip to cuff was tunneled in a retrograde fashion from the anterior chest wall to the venotomy incision. Next, the external jugular approach temporary dialysis catheter was removed and superficial hemostasis was achieved with manual compression. The new catheter was then placed through the peel-away sheath with tips ultimately positioned within the superior aspect of the right atrium. Final catheter  positioning was confirmed and documented with a spot radiographic image. The catheter aspirates and flushes normally. The catheter was flushed with appropriate volume heparin dwells. The catheter exit site was secured with  a 0-Prolene retention suture. The venotomy incision was closed with Dermabond and Steri-strips. Dressings were applied. The patient tolerated the procedure well without immediate post procedural complication. IMPRESSION: Successful placement of 28 cm tip to cuff tunneled hemodialysis catheter via the left internal jugular vein with tips terminating within the superior aspect of the right atrium. The catheter is ready for immediate use. Electronically Signed   By: Sandi Mariscal M.D.   On: 09/19/2019 15:25   IR US Guide Vasc Access Left  Result Date: 09/19/2019 INDICATION: End-stage renal disease. In need of durable intravenous access for continuation dialysis. Note, patient has existing left external jugular vein approach temporary dialysis catheter. Patient currently with open median sternotomy with subcutaneous surgical drains involving the ventral aspect of the chest bilaterally, the right-side of which courses regional to the expected subcutaneous track of a right chest dialysis catheter. Additionally, there is a skin defect/potential developing abscess involving the ventral aspect of the right upper chest, at the location of the standard entrance site of a right internal jugular approach dialysis catheter. As such, we will proceed with image guided placement of a left internal jugular approach dialysis catheter and removal of the temporary left external jugular approach dialysis catheter. EXAM: TUNNELED CENTRAL VENOUS HEMODIALYSIS CATHETER PLACEMENT WITH ULTRASOUND AND FLUOROSCOPIC GUIDANCE MEDICATIONS: Ancef 2 gm IV . The antibiotic was given in an appropriate time interval prior to skin puncture. ANESTHESIA/SEDATION: Moderate (conscious) sedation was employed during this procedure. A total  of Versed 1.5 mg and Fentanyl 75 mcg was administered intravenously. Moderate Sedation Time: 17 minutes. The patient's level of consciousness and vital signs were monitored continuously by radiology nursing throughout the procedure under my direct supervision. FLUOROSCOPY TIME:  1 minute, 6 seconds (11 mGy) COMPLICATIONS: None immediate. PROCEDURE: Informed written consent was obtained from the patient's family after a discussion of the risks, benefits, and alternatives to treatment. Questions regarding the procedure were encouraged and answered. The left neck and chest as well as the external portion of the existing left external jugular approach dialysis catheter were prepped with chlorhexidine in a sterile fashion, and a sterile drape was applied covering the operative field. Maximum barrier sterile technique with sterile gowns and gloves were used for the procedure. A timeout was performed prior to the initiation of the procedure. After creating a small venotomy incision, a micropuncture kit was utilized to access the internal jugular vein. Real-time ultrasound guidance was utilized for vascular access including the acquisition of a permanent ultrasound image documenting patency of the accessed vessel. The microwire was utilized to measure appropriate catheter length. A stiff Glidewire was advanced to the level of the IVC and the micropuncture sheath was exchanged for a peel-away sheath. A palindrome tunneled hemodialysis catheter measuring 28 cm from tip to cuff was tunneled in a retrograde fashion from the anterior chest wall to the venotomy incision. Next, the external jugular approach temporary dialysis catheter was removed and superficial hemostasis was achieved with manual compression. The new catheter was then placed through the peel-away sheath with tips ultimately positioned within the superior aspect of the right atrium. Final catheter positioning was confirmed and documented with a spot radiographic  image. The catheter aspirates and flushes normally. The catheter was flushed with appropriate volume heparin dwells. The catheter exit site was secured with a 0-Prolene retention suture. The venotomy incision was closed with Dermabond and Steri-strips. Dressings were applied. The patient tolerated the procedure well without immediate post procedural complication. IMPRESSION: Successful placement of 28 cm tip to  cuff tunneled hemodialysis catheter via the left internal jugular vein with tips terminating within the superior aspect of the right atrium. The catheter is ready for immediate use. Electronically Signed   By: Sandi Mariscal M.D.   On: 09/19/2019 15:25    Anti-infectives: Anti-infectives (From admission, onward)    Start     Dose/Rate Route Frequency Ordered Stop   09/19/19 1417  ceFAZolin (ANCEF) 2-4 GM/100ML-% IVPB    Note to Pharmacy: Rudene Re   : cabinet override      09/19/19 1417 09/20/19 0229   09/19/19 1330  ceFAZolin (ANCEF) IVPB 1 g/50 mL premix  Status:  Discontinued     1 g 100 mL/hr over 30 Minutes Intravenous Once 09/19/19 1317 09/19/19 1329   09/19/19 1330  ceFAZolin (ANCEF) IVPB 2g/100 mL premix     2 g 200 mL/hr over 30 Minutes Intravenous  Once 09/19/19 1327 09/19/19 1448   09/14/19 0915  vancomycin (VANCOCIN) IVPB 1000 mg/200 mL premix     1,000 mg 200 mL/hr over 60 Minutes Intravenous To Surgery 09/14/19 0907 09/15/19 0915   09/07/19 2000  vancomycin (VANCOREADY) IVPB 750 mg/150 mL     750 mg 150 mL/hr over 60 Minutes Intravenous  Once 09/07/19 1510 09/07/19 2245   09/06/19 2200  meropenem (MERREM) 500 mg in sodium chloride 0.9 % 100 mL IVPB     500 mg 200 mL/hr over 30 Minutes Intravenous Every 24 hours 09/06/19 1542 09/09/19 0015   09/06/19 2000  fluconazole (DIFLUCAN) IVPB 400 mg     400 mg 100 mL/hr over 120 Minutes Intravenous Every 24 hours 09/06/19 1542 09/08/19 2240   09/06/19 1800  vancomycin (VANCOREADY) IVPB 750 mg/150 mL     750 mg 150 mL/hr over  60 Minutes Intravenous  Once 09/06/19 1542 09/07/19 0022   09/06/19 1540  vancomycin variable dose per unstable renal function (pharmacist dosing)      Does not apply See admin instructions 09/06/19 1542 09/08/19 2359   09/06/19 1100  ceFAZolin (ANCEF) IVPB 2g/100 mL premix  Status:  Discontinued     2 g 200 mL/hr over 30 Minutes Intravenous 30 min pre-op 09/05/19 1717 09/06/19 1644   09/06/19 0600  ceFAZolin (ANCEF) IVPB 2g/100 mL premix  Status:  Discontinued     2 g 200 mL/hr over 30 Minutes Intravenous To Short Stay 09/06/19 0546 09/06/19 1644   09/02/19 2200  meropenem (MERREM) 1 g in sodium chloride 0.9 % 100 mL IVPB  Status:  Discontinued     1 g 200 mL/hr over 30 Minutes Intravenous Every 8 hours 09/02/19 1011 09/06/19 1542   09/02/19 2000  fluconazole (DIFLUCAN) IVPB 800 mg  Status:  Discontinued     800 mg 100 mL/hr over 240 Minutes Intravenous Every 24 hours 09/02/19 1011 09/06/19 1542   09/02/19 2000  fluconazole (DIFLUCAN) IVPB 800 mg  Status:  Discontinued     800 mg 200 mL/hr over 120 Minutes Intravenous Every 24 hours 09/02/19 1329 09/02/19 1330   09/02/19 1800  vancomycin (VANCOCIN) IVPB 1000 mg/200 mL premix  Status:  Discontinued     1,000 mg 200 mL/hr over 60 Minutes Intravenous Every 24 hours 09/02/19 1011 09/06/19 1542   09/02/19 0900  meropenem (MERREM) 1 g in sodium chloride 0.9 % 100 mL IVPB  Status:  Discontinued     1 g 200 mL/hr over 30 Minutes Intravenous Every 24 hours 09/01/19 1333 09/02/19 1011   09/01/19 1700  fluconazole (DIFLUCAN) IVPB 800 mg  800 mg 100 mL/hr over 240 Minutes Intravenous  Once 09/01/19 1645 09/01/19 2057   09/01/19 1630  fluconazole (DIFLUCAN) IVPB 400 mg  Status:  Discontinued     400 mg 100 mL/hr over 120 Minutes Intravenous  Once 09/01/19 1629 09/01/19 1645   09/01/19 1330  vancomycin variable dose per unstable renal function (pharmacist dosing)  Status:  Discontinued      Does not apply See admin instructions 09/01/19 1333  09/02/19 1325   09/01/19 0730  meropenem (MERREM) 1 g in sodium chloride 0.9 % 100 mL IVPB  Status:  Discontinued     1 g 200 mL/hr over 30 Minutes Intravenous Every 8 hours 09/01/19 0729 09/01/19 1327   08/31/19 2200  vancomycin (VANCOCIN) IVPB 1000 mg/200 mL premix  Status:  Discontinued     1,000 mg 200 mL/hr over 60 Minutes Intravenous Every 24 hours 08/30/19 2235 09/01/19 1330   08/25/19 0800  vancomycin (VANCOREADY) IVPB 1250 mg/250 mL  Status:  Discontinued     1,250 mg 166.7 mL/hr over 90 Minutes Intravenous Every 24 hours 08/24/19 2121 08/24/19 2128   08/25/19 0100  vancomycin (VANCOREADY) IVPB 1250 mg/250 mL  Status:  Discontinued     1,250 mg 166.7 mL/hr over 90 Minutes Intravenous Every 24 hours 08/24/19 2128 08/30/19 2235   08/24/19 2300  ceFEPIme (MAXIPIME) 2 g in sodium chloride 0.9 % 100 mL IVPB  Status:  Discontinued     2 g 200 mL/hr over 30 Minutes Intravenous Every 12 hours 08/24/19 2150 09/01/19 0729   08/24/19 1338  vancomycin (VANCOCIN) 1,000 mg in sodium chloride 0.9 % 1,000 mL irrigation  Status:  Discontinued       As needed 08/24/19 1338 08/24/19 1508   08/21/19 0000  ceFEPIme (MAXIPIME) 1 g in sodium chloride 0.9 % 100 mL IVPB  Status:  Discontinued     1 g 200 mL/hr over 30 Minutes Intravenous Every 24 hours 08/20/19 0954 08/24/19 2150   08/20/19 1200  vancomycin (VANCOCIN) IVPB 1000 mg/200 mL premix     1,000 mg 200 mL/hr over 60 Minutes Intravenous  Once 08/20/19 0954 08/21/19 0011   08/19/19 2129  vancomycin variable dose per unstable renal function (pharmacist dosing)  Status:  Discontinued      Does not apply See admin instructions 08/19/19 2129 08/24/19 2128   08/19/19 0000  ceFEPIme (MAXIPIME) 2 g in sodium chloride 0.9 % 100 mL IVPB  Status:  Discontinued     2 g 200 mL/hr over 30 Minutes Intravenous Every 24 hours 08/18/19 2351 08/20/19 0954   08/19/19 0000  vancomycin (VANCOREADY) IVPB 2000 mg/400 mL     2,000 mg 200 mL/hr over 120 Minutes  Intravenous  Once 08/18/19 2351 08/19/19 0354   08/16/19 1731  vancomycin (VANCOCIN) powder  Status:  Discontinued       As needed 08/16/19 1732 08/16/19 1808   08/15/19 2045  vancomycin (VANCOCIN) IVPB 1000 mg/200 mL premix     1,000 mg 200 mL/hr over 60 Minutes Intravenous  Once 08/15/19 1402 08/15/19 2209   08/15/19 1645  cefUROXime (ZINACEF) 1.5 g in sodium chloride 0.9 % 100 mL IVPB     1.5 g 200 mL/hr over 30 Minutes Intravenous Every 12 hours 08/15/19 1402 08/17/19 0700   08/15/19 0958  vancomycin (VANCOCIN) powder  Status:  Discontinued       As needed 08/15/19 1000 08/15/19 1402   08/15/19 0400  vancomycin (VANCOREADY) IVPB 1500 mg/300 mL     1,500 mg  150 mL/hr over 120 Minutes Intravenous To Surgery 08/14/19 1121 08/15/19 0830   08/15/19 0400  cefUROXime (ZINACEF) 1.5 g in sodium chloride 0.9 % 100 mL IVPB     1.5 g 200 mL/hr over 30 Minutes Intravenous To Surgery 08/14/19 1121 08/15/19 0812   08/15/19 0400  cefUROXime (ZINACEF) 750 mg in sodium chloride 0.9 % 100 mL IVPB     750 mg 200 mL/hr over 30 Minutes Intravenous To Surgery 08/14/19 1121 08/15/19 1300       Assessment/Plan: s/p Procedure(s): EVACUATION OF HEMATOMA, MEDIASTINAL EXPLORATION STERNAL PLATING LAPAROSCOPIC OMENTAL HARVEST APPLICATION OF ACELL, APPLICATION OF WOUND VAC USING PREVENA INCISIONAL DRESSING Pectoralis ADVANCEMENT Flap  Wound vac to be removed today, placed mepilex border dressing over sternal incision. Recommend changing every few days or sooner pending soil level, nursing staff to monitor incision for dehiscence.  Right JP drain in place, removed L JP drain. Plan to remove right JP drain Friday pending output. Drainage as expected at this time.     LOS: 42 days    Charlies Constable, PA-C 09/20/2019

## 2019-09-20 NOTE — Progress Notes (Addendum)
Nutrition Follow-up  DOCUMENTATION CODES:   Not applicable  INTERVENTION:   Plan for PEG placement  Trend phosphorus. Consider d/c binder as pt is now transitioning to renal formula.   Change tube feeding:  -Nepro @ 45 ml/hr via post-pyloric Cortrak (1080 ml) -Pro-Stat 60 mL TID  Provides 2544 kcals, 177 g of protein and 788 mL of free water Meets 100% estimated needs  Continue Renal MVI  NUTRITION DIAGNOSIS:   Increased nutrient needs related to post-op healing as evidenced by estimated needs.  Ongoing  GOAL:   Patient will meet greater than or equal to 90% of their needs  Addressed via TF  MONITOR:   Vent status, Skin, TF tolerance, Weight trends, I & O's, Labs  REASON FOR ASSESSMENT:   Ventilator    ASSESSMENT:   Patient with PMH significant for CAD s/p stenting, HTN, HLD, DM, and BPH. Presents this admission with RLL PE and for CABG.   4/14- admitted  4/20- CABG x 4, chest left open 4/22- chest closure, application of incisional wound vac 4/23- extubated, cardiac arrest, chest re-opened for washout, clot removed, re-intubated 4/26- return to OR for washout, cortrak placed, TF initiated 4/29- chest washout, application of wound VAC, CRRT initiated 5/5- chest washout, tracheostomy 5/10- chest washout, wound VAC 5/12- omental harvest, R pectoral muscle advancement flap, layered closure of sternal wound, wound VAC, stop CRRT 5/13- first HD 5/20- mediastinal exploration, evacuation of hematoma, wound VAC 5/24- trach downsized 5/25- L tunneled HD cath  Pt discussed during ICU rounds and with RN.   On trach collar. Starting PMV trials. Wound VAC removed from incision site. Had ongoing diarrhea over the last week. Per MAR, pt provided with colace off/on. This was discontinued yesterday and output has improved. Plan PEG placement. Change formula to better meet needs.   Not a candidate for CIR. Awaiting LTAC placement.   Admission weight: 97 kg  Current  weight: 84.6 kg (stable over the last week)  I/O: +9,509 ml since 5/12 UOP: anuric JP drains: 60 ml x 24 hrs Last HD: 2000 ml net UF Stool: 125 ml x 24 hrs   Medications: dulcolax, aranesp, SS novolog, levemir, rena-vit, renvela Labs: Phosphorus 5.5 (H)   Diet Order:   Diet Order            Diet NPO time specified Except for: Sips with Meds  Diet effective midnight              EDUCATION NEEDS:   Not appropriate for education at this time  Skin:  Skin Assessment: Skin Integrity Issues: Skin Integrity Issues:: Stage II, Incisions, Wound VAC Stage II: upper lip Incisions: chest, L arm, abdomen Other: n/a  Last BM:  5/25  Height:   Ht Readings from Last 1 Encounters:  08/28/19 5\' 9"  (1.753 m)    Weight:   Wt Readings from Last 1 Encounters:  09/20/19 84.6 kg   BMI:  Body mass index is 27.54 kg/m.  Estimated Nutritional Needs:   Kcal:  2125-2550  Protein:  170-195 grams  Fluid:  >/= 2 L/day   Mariana Single RD, LDN Clinical Nutrition Pager listed in [place]

## 2019-09-20 NOTE — Progress Notes (Signed)
Patient taken outside in chair, on monitor, O2 via trach and emergency equipment.  This RN was accompanied by another Therapist, sports and patients wife.  VS remained stable and patient denied any complaints or needs. Returned to room and placed back on wall O2.  Patient remains in chair and denies any needs at this time.

## 2019-09-20 NOTE — Progress Notes (Signed)
Inpatient Rehabilitation-Admissions Coordinator   Met with pt bedside. He was up in recliner but appearing tired. Per updated PT note, pt required maxisky lift for OOB transfer. Based on interaction this AM, doubt pt could tolerate CIR program. If pt is medically ready for DC to next venue, would recommend LTAC at this time due to an inability to tolerate CIR program at this time. TOC team notified.   Raechel Ache, OTR/L  Rehab Admissions Coordinator  [phone] 09/20/2019 12:55 PM

## 2019-09-20 NOTE — Progress Notes (Signed)
[facility] following for potential admit once [facility] has released pt. Currently [facility] without HD beds. Wife only interested in [facility].  TOC team will continue to monitor and follow. Whitman Hero RN,BSN,CM

## 2019-09-20 NOTE — Progress Notes (Signed)
  Speech Language Pathology Treatment: Nada Boozer Speaking valve  Patient Details Name: Christian [name] MRN: [id number] DOB: 25-Nov-1945 Today's Date: 09/20/2019 Time: [id number] SLP Time Calculation (min) (ACUTE ONLY): 13 min  Assessment / Plan / Recommendation Clinical Impression  Pt was alert and coopertive throughout the session. Vital were RR 21, SpO2 100, and HR 100 at baseline. Cuff was deflated upon SLP's arrival and his wife was at bedside. Pt's wife agreed that the pt was more alert today but she indicated that he had not been interactive since she arrived. PMSV was in place for 11 minutes with no evidence of back pressure and vitals RR 21-23, SpO2 100, and HR 100. Pt was able to vocalize with finger occlusion but did not make any attempts to vocalize/verbalize following PMSV placement despite encouragement from this SLP and his wife. Vocal quality during finger occlusion was hoarse with low vocal intensity. It is recommended that PMSV be used with all therapies when full supervision is provided and SLP will continue to follow pt.    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 4/23 cardiac arrest, 4/29 initiated CRRT, vent s/p trach. 5/10 additional sternal washout unable to close.  5/12 sternal plating with pectoralis flap and omental harvest to mediastinum off CRRT with iHD started. Chest wound closed. Neurology consulted for left sided weakness- CTH neg 09/08/19. 5/20 pt with emergent hematoma evacuation and transition to trach collar; 5/24 downsized to #6 cuffed trach. PMHx: BPH, CAD, DM, HLD, HTN      SLP Plan  Continue with current plan of care       Recommendations         Patient may use Passy-Muir Speech Valve: During all therapies with supervision PMSV Supervision: Full         Oral Care Recommendations: Oral care QID Follow up Recommendations: Inpatient Rehab SLP Visit Diagnosis: Aphonia  (R49.1) Plan: Continue with current plan of care       Chaia Ikard I. Hardin Negus, [place], [place] Office number [phone] Pager [phone]               Horton Marshall 09/20/2019, 1:00 PM

## 2019-09-20 NOTE — Progress Notes (Signed)
CT surgery p.m. Rounds  Patient had good day, transported to the patio on O2 via trach collar.  HD planned for tomorrow. IR PEG now on hold

## 2019-09-20 NOTE — Progress Notes (Signed)
[facility] Progress Note     Assessment/ Plan:    1. AKI- (crt 1.22 on 07/24/19) following cardiac cath, cabg, cardiogenic shock, cardiac arrest due to tamponade, and acute on chronic CHF. Started CRRT on 08/24/19-5/12 due to extensive volume overload preventing chest closure.  1. On TTS schedule this week, successful HD yesterday with UF 2L, next will be due 5/27; may end up needing extra treatment for ultrafiltration but if we can make progress with TIW I'd prefer that.  Plan UF 3-3.5L tomorrow with albumin support if needed.  2. S/p RIJ TDC 5/25 IR.  3. Remains anuric- Unclear if recovery is possible given major insult->bladder scan 5/17 (35m)(no foley).  Will bladder scan today.   2. CAD s/p CABG x 4 complicated by cardiac tamponade s/p reopened chest urgently on 08/18/19 with wound vac.most recent OR On 5/12and then 5/20 (evacuation of blood in chest) 3. Acute systolic heart failure due to ischemic cardiomyopathy- volume markedly improved with CRRT. Will cont UF w/ HD since tolerating and no pressors, ^ UF with tomorrows treatment.  4. Postoperative acute hypoxic respiratory failure- reintubated 08/18/19 and likely aspiration. Now trach collar 5. Anemia- Hgbstable in 8s > 9. transfuse prn.On ESA- darbe 150 6. Elytes-phosphorus is improved with dialysis but had to add renvela to TF 7. Dispo - LTACH planned, discussion for PEG underway.   Subjective:   No new issues --> [facility]   Tolerated 2L UF yesterday.    Objective:   BP (!) 164/79   Pulse 100   Temp 97.6 F (36.4 C) (Oral)   Resp 20   Ht _0  (1.753 m)   Wt 84.6 kg   SpO2 100%   BMI 27.54 kg/m   Intake/Output Summary (Last 24 hours) at 09/20/2019 1209 Last data filed at 09/20/2019 1059 Gross per 24 hour  Intake 6232.5 ml  Output 185 ml  Net 6047.5 ml   Weight change: -1.9 kg  Physical Exam: Gen: up in chair but drowsy CVS:  Tachycardic Resp: coarse BS anteriorly, on TC Ext: diffuse pitting edema throughout GU: no foley Access: Lt SCVtemp (5/7)  Imaging: CT CHEST WO CONTRAST  Result Date: 09/18/2019 CLINICAL DATA:  Chest pain, shortness of breath, pleural effusion at, history of coronary artery disease post CABG, diabetes mellitus, hypertension EXAM: CT CHEST WITHOUT CONTRAST TECHNIQUE: Multidetector CT imaging of the chest was performed following the standard protocol without IV contrast. Sagittal and coronal MPR images reconstructed from axial data set. COMPARISON:  08/09/2019 FINDINGS: Cardiovascular: Atherosclerotic calcifications aorta and coronary arteries. Postsurgical changes of CABG. LEFT central venous catheter with tip at SVC. Enlargement of cardiac chambers. Minimal pericardial effusion. Aorta normal caliber. Mediastinum/Nodes: Tip of tracheostomy tube above carina. Nasogastric tube extends into stomach. Two surgical drains in the anterior chest wall. Esophagus unremarkable. 6 mm RIGHT thyroid nodule; BILATERAL pleural effusions and scattered atelectasis greatest in LEFT lower lobe. No definite infiltrate or pneumothorax. (Ref: J Am Coll Radiol. 2015 Feb;12(2): 143-50). Base of cervical region otherwise normal appearance. Stranding of the anterior mediastinum likely related to prior cardiac surgery and sternal dehiscence, with herniation of fat through the sternotomy. No thoracic adenopathy. Lungs/Pleura: BILATERAL pleural effusions, small. Scattered atelectasis in both lungs greatest LEFT lower lobe. Pleural calcifications in hemithoraces bilaterally likely reflects asbestos exposure. No infiltrate or pneumothorax. Upper Abdomen: Diffuse somewhat nodular thickening of the adrenal glands bilaterally likely representing multiple small nodules question adenomas as noted on prior  exam. High attenuation material within the gallbladder question vicarious excretion of prior contrast. Hepatic margins appear slightly  irregular raising question of cirrhosis Musculoskeletal: No additional osseous findings. Diffuse stranding identified along the RIGHT pectoralis major and minor muscles which could be related to prior surgery, hemorrhage, or infection. No soft tissue gas. IMPRESSION: BILATERAL pleural effusions and scattered atelectasis greatest in LEFT lower lobe. Pleural calcifications in hemithoraces bilaterally question asbestos exposure. Stranding of the anterior mediastinum likely related to prior cardiac surgery and sternal dehiscence, with herniation of fat through the sternotomy. Diffuse stranding along the RIGHT pectoralis major and minor muscles which could be related to prior surgery, hemorrhage, or infection. 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 Minimally irregular hepatic margins cannot exclude cirrhosis. Aortic Atherosclerosis (ICD10-I70.0). Electronically Signed   By: Lavonia Dana M.D.   On: 09/18/2019 15:27   IR Fluoro Guide CV Line Left  Result Date: 09/19/2019 INDICATION: End-stage renal disease. In need of durable intravenous access for continuation dialysis. Note, patient has existing left external jugular vein approach temporary dialysis catheter. Patient currently with open median sternotomy with subcutaneous surgical drains involving the ventral aspect of the chest bilaterally, the right-side of which courses regional to the expected subcutaneous track of a right chest dialysis catheter. Additionally, there is a skin defect/potential developing abscess involving the ventral aspect of the right upper chest, at the location of the standard entrance site of a right internal jugular approach dialysis catheter. As such, we will proceed with image guided placement of a left internal jugular approach dialysis catheter and removal of the temporary left external jugular approach dialysis catheter. EXAM: TUNNELED CENTRAL VENOUS HEMODIALYSIS CATHETER PLACEMENT WITH ULTRASOUND AND FLUOROSCOPIC GUIDANCE MEDICATIONS: Ancef 2 gm  IV . The antibiotic was given in an appropriate time interval prior to skin puncture. ANESTHESIA/SEDATION: Moderate (conscious) sedation was employed during this procedure. A total of Versed 1.5 mg and Fentanyl 75 mcg was administered intravenously. Moderate Sedation Time: 17 minutes. The patient's level of consciousness and vital signs were monitored continuously by radiology nursing throughout the procedure under my direct supervision. FLUOROSCOPY TIME:  1 minute, 6 seconds (11 mGy) COMPLICATIONS: None immediate. PROCEDURE: Informed written consent was obtained from the patient's family after a discussion of the risks, benefits, and alternatives to treatment. Questions regarding the procedure were encouraged and answered. The left neck and chest as well as the external portion of the existing left external jugular approach dialysis catheter were prepped with chlorhexidine in a sterile fashion, and a sterile drape was applied covering the operative field. Maximum barrier sterile technique with sterile gowns and gloves were used for the procedure. A timeout was performed prior to the initiation of the procedure. After creating a small venotomy incision, a micropuncture kit was utilized to access the internal jugular vein. Real-time ultrasound guidance was utilized for vascular access including the acquisition of a permanent ultrasound image documenting patency of the accessed vessel. The microwire was utilized to measure appropriate catheter length. A stiff Glidewire was advanced to the level of the IVC and the micropuncture sheath was exchanged for a peel-away sheath. A palindrome tunneled hemodialysis catheter measuring 28 cm from tip to cuff was tunneled in a retrograde fashion from the anterior chest wall to the venotomy incision. Next, the external jugular approach temporary dialysis catheter was removed and superficial hemostasis was achieved with manual compression. The new catheter was then placed through  the peel-away sheath with tips ultimately positioned within the superior aspect of the right atrium. Final catheter positioning was confirmed  and documented with a spot radiographic image. The catheter aspirates and flushes normally. The catheter was flushed with appropriate volume heparin dwells. The catheter exit site was secured with a 0-Prolene retention suture. The venotomy incision was closed with Dermabond and Steri-strips. Dressings were applied. The patient tolerated the procedure well without immediate post procedural complication. IMPRESSION: Successful placement of 28 cm tip to cuff tunneled hemodialysis catheter via the left internal jugular vein with tips terminating within the superior aspect of the right atrium. The catheter is ready for immediate use. Electronically Signed   By: Sandi Mariscal M.D.   On: 09/19/2019 15:25   IR US Guide Vasc Access Left  Result Date: 09/19/2019 INDICATION: End-stage renal disease. In need of durable intravenous access for continuation dialysis. Note, patient has existing left external jugular vein approach temporary dialysis catheter. Patient currently with open median sternotomy with subcutaneous surgical drains involving the ventral aspect of the chest bilaterally, the right-side of which courses regional to the expected subcutaneous track of a right chest dialysis catheter. Additionally, there is a skin defect/potential developing abscess involving the ventral aspect of the right upper chest, at the location of the standard entrance site of a right internal jugular approach dialysis catheter. As such, we will proceed with image guided placement of a left internal jugular approach dialysis catheter and removal of the temporary left external jugular approach dialysis catheter. EXAM: TUNNELED CENTRAL VENOUS HEMODIALYSIS CATHETER PLACEMENT WITH ULTRASOUND AND FLUOROSCOPIC GUIDANCE MEDICATIONS: Ancef 2 gm IV . The antibiotic was given in an appropriate time interval  prior to skin puncture. ANESTHESIA/SEDATION: Moderate (conscious) sedation was employed during this procedure. A total of Versed 1.5 mg and Fentanyl 75 mcg was administered intravenously. Moderate Sedation Time: 17 minutes. The patient's level of consciousness and vital signs were monitored continuously by radiology nursing throughout the procedure under my direct supervision. FLUOROSCOPY TIME:  1 minute, 6 seconds (11 mGy) COMPLICATIONS: None immediate. PROCEDURE: Informed written consent was obtained from the patient's family after a discussion of the risks, benefits, and alternatives to treatment. Questions regarding the procedure were encouraged and answered. The left neck and chest as well as the external portion of the existing left external jugular approach dialysis catheter were prepped with chlorhexidine in a sterile fashion, and a sterile drape was applied covering the operative field. Maximum barrier sterile technique with sterile gowns and gloves were used for the procedure. A timeout was performed prior to the initiation of the procedure. After creating a small venotomy incision, a micropuncture kit was utilized to access the internal jugular vein. Real-time ultrasound guidance was utilized for vascular access including the acquisition of a permanent ultrasound image documenting patency of the accessed vessel. The microwire was utilized to measure appropriate catheter length. A stiff Glidewire was advanced to the level of the IVC and the micropuncture sheath was exchanged for a peel-away sheath. A palindrome tunneled hemodialysis catheter measuring 28 cm from tip to cuff was tunneled in a retrograde fashion from the anterior chest wall to the venotomy incision. Next, the external jugular approach temporary dialysis catheter was removed and superficial hemostasis was achieved with manual compression. The new catheter was then placed through the peel-away sheath with tips ultimately positioned within the  superior aspect of the right atrium. Final catheter positioning was confirmed and documented with a spot radiographic image. The catheter aspirates and flushes normally. The catheter was flushed with appropriate volume heparin dwells. The catheter exit site was secured with a 0-Prolene retention suture. The venotomy  incision was closed with Dermabond and Steri-strips. Dressings were applied. The patient tolerated the procedure well without immediate post procedural complication. IMPRESSION: Successful placement of 28 cm tip to cuff tunneled hemodialysis catheter via the left internal jugular vein with tips terminating within the superior aspect of the right atrium. The catheter is ready for immediate use. Electronically Signed   By: Sandi Mariscal M.D.   On: 09/19/2019 15:25    Labs: BMET Recent Labs  Lab 09/14/19 0225 09/14/19 1800 09/15/19 0009 09/15/19 0329 09/16/19 9323 09/17/19 0345 09/18/19 0336 09/19/19 0312 09/20/19 0333  NA 133*   < > 136 137 135 138 139 136 136  K 5.4*   < > 4.5 4.4 3.9 4.5 4.0 4.9 4.6  CL 96*   < > 97* 96* 94* 97* 97* 97* 98  CO2 20*   < > 21* _0 GLUCOSE 196*   < > 292* 249* 191* 173* 62* 132* 173*  BUN 96*   < > 109* 110* 82* 121* 76* 114* 69*  CREATININE 6.83*   < > 7.46* 7.59* 5.38* 6.91* 4.98* 6.52* 4.93*  CALCIUM 8.3*   < > 8.2* 7.9* 8.0* 8.2* 8.6* 8.8* 8.7*  PHOS 7.5*  --   --  9.2* 5.7* 6.6* 5.3* 6.9* 5.5*   < > = values in this interval not displayed.   CBC Recent Labs  Lab 09/14/19 0805 09/14/19 1425 09/15/19 0329 09/15/19 0329 09/16/19 0438 09/17/19 0345 09/18/19 0336 09/19/19 0312  WBC 13.4*   < > 9.8   < > 10.1 11.2* 13.1* 13.4*  NEUTROABS 8.9*  --  6.9  --  6.7 7.3  --   --   HGB 6.1*   < > 9.0*   < > 8.1* 8.3* 8.8* 9.0*  HCT 20.6*   < > 27.2*   < > 25.3* 26.1* 28.1* 28.6*  MCV 101.5*   < > 95.1   < > 97.3 97.8 98.6 99.0  PLT 393   < > 302   < > 318 361 373 406*   < > = values in this interval not displayed.     Medications:    .  stroke: mapping our early stages of recovery book   Does not apply Once  . sodium chloride   Intravenous Once  . sodium chloride   Intravenous Once  . sodium chloride   Intravenous Once  . alteplase  2 mg Intracatheter Once  . amiodarone  200 mg Per Tube BID  . arformoterol  15 mcg Nebulization BID  . aspirin  81 mg Per Tube Daily  . atorvastatin  40 mg Per Tube Daily  . bisacodyl  10 mg Rectal Daily  . chlorhexidine gluconate (MEDLINE KIT)  15 mL Mouth Rinse BID  . Chlorhexidine Gluconate Cloth  6 each Topical Q0600  . clonazePAM  0.5 mg Per Tube BID  . darbepoetin (ARANESP) injection - NON-DIALYSIS  150 mcg Subcutaneous Q Mon-1800  . feeding supplement (PRO-STAT SUGAR FREE 64)  60 mL Per Tube TID  . insulin aspart  0-15 Units Subcutaneous Q4H  . insulin detemir  10 Units Subcutaneous BID  . mouth rinse  15 mL Mouth Rinse 10 times per day  . midodrine  5 mg Per Tube TID WC  . multivitamin  1 tablet Oral QHS  . oxyCODONE  5 mg Per Tube Q6H  . pantoprazole sodium  40 mg Per Tube Daily  . revefenacin  175 mcg Nebulization  Daily  . sertraline  25 mg Per Tube Daily  . sevelamer carbonate  0.8 g Per Tube TID WC  . sodium chloride flush  10-40 mL Intracatheter Q12H  . sodium chloride flush  10-40 mL Intracatheter Q12H  . trospium  20 mg Per Tube BID    Jannifer Hick MD [facility] Pager [phone]

## 2019-09-20 NOTE — Progress Notes (Addendum)
      [address]       [place] [address]             [phone]      6 Days Post-Op Procedure(s) (LRB): EVACUATION OF HEMATOMA (N/A)   Subjective:  Patient says he is so/so.  CIR spoke with patient yesterday, he is not felt to be a candidate for inpatient rehab due to weakness   Objective: Vital signs in last 24 hours: Temp:  [98 F (36.7 C)-99.4 F (37.4 C)] 98.1 F (36.7 C) (05/26 0732) Pulse Rate:  [96-110] 99 (05/26 0700) Cardiac Rhythm: Normal sinus rhythm (05/25 2000) Resp:  [15-26] 15 (05/26 0700) BP: (100-161)/(58-115) 150/72 (05/26 0700) SpO2:  [97 %-100 %] 100 % (05/26 0737) FiO2 (%):  [28 %] 28 % (05/26 0737) Weight:  [82.7 kg-84.6 kg] 84.6 kg (05/26 0605)  Intake/Output from previous day: 05/25 0701 - 05/26 0700 In: 6372.5 [NG/GT:6372.5] Out: 2185 [Drains:60; Stool:125]  General appearance: cooperative and no distress Heart: regular rate and rhythm Lungs: coarse throughout Abdomen: soft, non-tender; bowel sounds normal; no masses,  no organomegaly and flexiseal in place Extremities: edema trace Wound: clean and dry, staples remain in place on left radial artery site  Lab Results: Recent Labs    09/18/19 0336 09/19/19 0312  WBC 13.1* 13.4*  HGB 8.8* 9.0*  HCT 28.1* 28.6*  PLT 373 406*   BMET:  Recent Labs    09/19/19 0312 09/20/19 0333  NA 136 136  K 4.9 4.6  CL 97* 98  CO2 25 26  GLUCOSE 132* 173*  BUN 114* 69*  CREATININE 6.52* 4.93*  CALCIUM 8.8* 8.7*    PT/INR: No results for input(s): LABPROT, INR in the last 72 hours. ABG    Component Value Date/Time   PHART 7.481 (H) 09/07/2019 0029   HCO3 23.3 09/07/2019 0029   TCO2 24 09/07/2019 0029   ACIDBASEDEF 5.0 (H) 08/18/2019 2341   O2SAT 99.0 09/07/2019 0029   CBG (last 3)  Recent Labs    09/19/19 2333 09/20/19 0346 09/20/19 0729  GLUCAP 189* 159* 176*    Assessment/Plan: S/P Procedure(s) (LRB): EVACUATION OF HEMATOMA (N/A)  1. CV- NSR, SBP okay  with renal injury- on Pacerone, will decrease Midodrine 2. Pulm- off ventilator, weaning trach collar as able, remains on oxygen 3. Renal- ESRD, dialysis catheter in place on T/TH/Sat schedule 4. Wound- okay to remove pravena today, remove remaining staples from left radial artery site 5. Deconditioning- severe, not a candidate for CIR due to weakness and lack of participation... patient will need LTAC... okay for discharge once can be arranged   LOS: 42 days    [name], PA-C  09/20/2019 Pt is seen and examined; agree with note. I would prefer to avoid PEG and work on swallowing function with SLP now that he is extubated.  Lochlin Eppinger Z. Orvan Seen, [place]

## 2019-09-21 LAB — GLUCOSE, CAPILLARY
Glucose-Capillary: 105 mg/dL — ABNORMAL HIGH (ref 70–99)
Glucose-Capillary: 127 mg/dL — ABNORMAL HIGH (ref 70–99)
Glucose-Capillary: 139 mg/dL — ABNORMAL HIGH (ref 70–99)
Glucose-Capillary: 151 mg/dL — ABNORMAL HIGH (ref 70–99)
Glucose-Capillary: 159 mg/dL — ABNORMAL HIGH (ref 70–99)
Glucose-Capillary: 209 mg/dL — ABNORMAL HIGH (ref 70–99)

## 2019-09-21 LAB — MAGNESIUM: Magnesium: 2.7 mg/dL — ABNORMAL HIGH (ref 1.7–2.4)

## 2019-09-21 LAB — RENAL FUNCTION PANEL
Albumin: 2.4 g/dL — ABNORMAL LOW (ref 3.5–5.0)
Anion gap: 15 (ref 5–15)
BUN: 103 mg/dL — ABNORMAL HIGH (ref 8–23)
CO2: 25 mmol/L (ref 22–32)
Calcium: 8.9 mg/dL (ref 8.9–10.3)
Chloride: 96 mmol/L — ABNORMAL LOW (ref 98–111)
Creatinine, Ser: 6.49 mg/dL — ABNORMAL HIGH (ref 0.61–1.24)
GFR calc Af Amer: 9 mL/min — ABNORMAL LOW (ref >60)
GFR calc non Af Amer: 8 mL/min — ABNORMAL LOW (ref >60)
Glucose, Bld: 132 mg/dL — ABNORMAL HIGH (ref 70–99)
Phosphorus: 6.3 mg/dL — ABNORMAL HIGH (ref 2.5–4.6)
Potassium: 4.8 mmol/L (ref 3.5–5.1)
Sodium: 136 mmol/L (ref 135–145)

## 2019-09-21 LAB — CBC
HCT: 29.6 % — ABNORMAL LOW (ref 39.0–52.0)
Hemoglobin: 9.3 g/dL — ABNORMAL LOW (ref 13.0–17.0)
MCH: 31 pg (ref 26.0–34.0)
MCHC: 31.4 g/dL (ref 30.0–36.0)
MCV: 98.7 fL (ref 80.0–100.0)
Platelets: 403 10*3/uL — ABNORMAL HIGH (ref 150–400)
RBC: 3 MIL/uL — ABNORMAL LOW (ref 4.22–5.81)
RDW: 17.4 % — ABNORMAL HIGH (ref 11.5–15.5)
WBC: 12.9 10*3/uL — ABNORMAL HIGH (ref 4.0–10.5)
nRBC: 0.3 % — ABNORMAL HIGH (ref 0.0–0.2)

## 2019-09-21 MED ORDER — CHLORHEXIDINE GLUCONATE CLOTH 2 % EX PADS
6.0000 | MEDICATED_PAD | Freq: Every day | CUTANEOUS | Status: DC
  Administered 2019-09-21 – 2019-09-29 (×7): 6 via TOPICAL

## 2019-09-21 MED ORDER — ORAL CARE MOUTH RINSE
15.0000 mL | Freq: Two times a day (BID) | OROMUCOSAL | Status: DC
  Administered 2019-09-23 – 2019-10-17 (×28): 15 mL via OROMUCOSAL

## 2019-09-21 MED ORDER — ALBUMIN HUMAN 25 % IV SOLN
INTRAVENOUS | Status: AC
  Filled 2019-09-21: qty 100

## 2019-09-21 MED ORDER — CHLORHEXIDINE GLUCONATE 0.12 % MT SOLN
15.0000 mL | Freq: Two times a day (BID) | OROMUCOSAL | Status: DC
  Administered 2019-09-22 – 2019-10-18 (×35): 15 mL via OROMUCOSAL
  Filled 2019-09-21 (×34): qty 15

## 2019-09-21 MED ORDER — HEPARIN SODIUM (PORCINE) 1000 UNIT/ML IJ SOLN
INTRAMUSCULAR | Status: AC
  Filled 2019-09-21: qty 4

## 2019-09-21 NOTE — Progress Notes (Signed)
[facility] Progress Note     Assessment/ Plan:    1. AKI- (crt 1.22 on 07/24/19) following cardiac cath, cabg, cardiogenic shock, cardiac arrest due to tamponade, and acute on chronic CHF. Started CRRT on 08/24/19-5/12 due to extensive volume overload preventing chest closure.  1. On TTS schedule this week, successful HD today with more UF (gave albumin support to facilitate today).  Next HD would be due 5/29.   2. S/p RIJ [facility] 5/25 IR.  3. Remains anuric- Unclear if recovery is possible given major insult->bladder scans have been negative  2. CAD s/p CABG x 4 complicated by cardiac tamponade s/p reopened chest urgently on 08/18/19 with wound vac.most recent OR On 5/12and then 5/20 (evacuation of blood in chest) 3. Acute systolic heart failure due to ischemic cardiomyopathy- Working towards euvolemia with UF on HD.  4. Postoperative acute hypoxic respiratory failure on h/o COPD- reintubated 08/18/19 and likely aspiration. Now improved to  trach collar 5. Anemia- Hgbstable in 8s > 9. transfuse prn.On ESA- darbe 150 6. A fib per primary 7. Dispo - LTACH planned, discussion for PEG underway.   Subjective:   No new issues --> not eligible for CIR so looking at [facility] and PEG being discussed.   Went out to [employer] on [address].  Seen on HD today - tolerating fairly well with UF 2.4L about 2/3 through treatment.     Objective:   BP (!) 107/55   Pulse (!) 112   Temp 98.1 F (36.7 C)   Resp (!) 24   Ht 5' 9"  (1.753 m)   Wt 82.4 kg   SpO2 99%   BMI 26.83 kg/m   Intake/Output Summary (Last 24 hours) at 09/21/2019 1447 Last data filed at 09/21/2019 1040 Gross per 24 hour  Intake 1081.75 ml  Output 3085 ml  Net -2003.25 ml   Weight change: 2.5 kg  Physical Exam: Gen: in bed CVS: Tachycardic Resp: coarse BS anteriorly, on TC Ext: diffuse pitting edema throughout but legs improved to 1+, still 2+ in arms.  GU: no foley [facility]   Imaging: IR Fluoro  Guide CV Line Left  Result Date: 09/19/2019 INDICATION: End-stage renal disease. In need of durable intravenous access for continuation dialysis. Note, patient has existing left external jugular vein approach temporary dialysis catheter. Patient currently with open median sternotomy with subcutaneous surgical drains involving the ventral aspect of the chest bilaterally, the right-side of which courses regional to the expected subcutaneous track of a right chest dialysis catheter. Additionally, there is a skin defect/potential developing abscess involving the ventral aspect of the right upper chest, at the location of the standard entrance site of a right internal jugular approach dialysis catheter. As such, we will proceed with image guided placement of a left internal jugular approach dialysis catheter and removal of the temporary left external jugular approach dialysis catheter. EXAM: TUNNELED CENTRAL VENOUS HEMODIALYSIS CATHETER PLACEMENT WITH ULTRASOUND AND FLUOROSCOPIC GUIDANCE MEDICATIONS: Ancef 2 gm IV . The antibiotic was given in an appropriate time interval prior to skin puncture. ANESTHESIA/SEDATION: Moderate (conscious) sedation was employed during this procedure. A total of Versed 1.5 mg and Fentanyl 75 mcg was administered intravenously. Moderate Sedation Time: 17 minutes. The patient's level of consciousness and vital signs were monitored continuously by radiology nursing throughout the procedure under my direct supervision. FLUOROSCOPY TIME:  1 minute, 6 seconds (11 mGy) COMPLICATIONS: None immediate. PROCEDURE: Informed written consent was obtained from the patient's family after a discussion of the  risks, benefits, and alternatives to treatment. Questions regarding the procedure were encouraged and answered. The left neck and chest as well as the external portion of the existing left external jugular approach dialysis catheter were prepped with chlorhexidine in a sterile fashion, and a sterile  drape was applied covering the operative field. Maximum barrier sterile technique with sterile gowns and gloves were used for the procedure. A timeout was performed prior to the initiation of the procedure. After creating a small venotomy incision, a micropuncture kit was utilized to access the internal jugular vein. Real-time ultrasound guidance was utilized for vascular access including the acquisition of a permanent ultrasound image documenting patency of the accessed vessel. The microwire was utilized to measure appropriate catheter length. A stiff Glidewire was advanced to the level of the IVC and the micropuncture sheath was exchanged for a peel-away sheath. A palindrome tunneled hemodialysis catheter measuring 28 cm from tip to cuff was tunneled in a retrograde fashion from the anterior chest wall to the venotomy incision. Next, the external jugular approach temporary dialysis catheter was removed and superficial hemostasis was achieved with manual compression. The new catheter was then placed through the peel-away sheath with tips ultimately positioned within the superior aspect of the right atrium. Final catheter positioning was confirmed and documented with a spot radiographic image. The catheter aspirates and flushes normally. The catheter was flushed with appropriate volume heparin dwells. The catheter exit site was secured with a 0-Prolene retention suture. The venotomy incision was closed with Dermabond and Steri-strips. Dressings were applied. The patient tolerated the procedure well without immediate post procedural complication. IMPRESSION: Successful placement of 28 cm tip to cuff tunneled hemodialysis catheter via the left internal jugular vein with tips terminating within the superior aspect of the right atrium. The catheter is ready for immediate use. Electronically Signed   By: Sandi Mariscal M.D.   On: 09/19/2019 15:25   IR US Guide Vasc Access Left  Result Date: 09/19/2019 INDICATION:  End-stage renal disease. In need of durable intravenous access for continuation dialysis. Note, patient has existing left external jugular vein approach temporary dialysis catheter. Patient currently with open median sternotomy with subcutaneous surgical drains involving the ventral aspect of the chest bilaterally, the right-side of which courses regional to the expected subcutaneous track of a right chest dialysis catheter. Additionally, there is a skin defect/potential developing abscess involving the ventral aspect of the right upper chest, at the location of the standard entrance site of a right internal jugular approach dialysis catheter. As such, we will proceed with image guided placement of a left internal jugular approach dialysis catheter and removal of the temporary left external jugular approach dialysis catheter. EXAM: TUNNELED CENTRAL VENOUS HEMODIALYSIS CATHETER PLACEMENT WITH ULTRASOUND AND FLUOROSCOPIC GUIDANCE MEDICATIONS: Ancef 2 gm IV . The antibiotic was given in an appropriate time interval prior to skin puncture. ANESTHESIA/SEDATION: Moderate (conscious) sedation was employed during this procedure. A total of Versed 1.5 mg and Fentanyl 75 mcg was administered intravenously. Moderate Sedation Time: 17 minutes. The patient's level of consciousness and vital signs were monitored continuously by radiology nursing throughout the procedure under my direct supervision. FLUOROSCOPY TIME:  1 minute, 6 seconds (11 mGy) COMPLICATIONS: None immediate. PROCEDURE: Informed written consent was obtained from the patient's family after a discussion of the risks, benefits, and alternatives to treatment. Questions regarding the procedure were encouraged and answered. The left neck and chest as well as the external portion of the existing left external jugular approach dialysis catheter were  prepped with chlorhexidine in a sterile fashion, and a sterile drape was applied covering the operative field. Maximum  barrier sterile technique with sterile gowns and gloves were used for the procedure. A timeout was performed prior to the initiation of the procedure. After creating a small venotomy incision, a micropuncture kit was utilized to access the internal jugular vein. Real-time ultrasound guidance was utilized for vascular access including the acquisition of a permanent ultrasound image documenting patency of the accessed vessel. The microwire was utilized to measure appropriate catheter length. A stiff Glidewire was advanced to the level of the IVC and the micropuncture sheath was exchanged for a peel-away sheath. A palindrome tunneled hemodialysis catheter measuring 28 cm from tip to cuff was tunneled in a retrograde fashion from the anterior chest wall to the venotomy incision. Next, the external jugular approach temporary dialysis catheter was removed and superficial hemostasis was achieved with manual compression. The new catheter was then placed through the peel-away sheath with tips ultimately positioned within the superior aspect of the right atrium. Final catheter positioning was confirmed and documented with a spot radiographic image. The catheter aspirates and flushes normally. The catheter was flushed with appropriate volume heparin dwells. The catheter exit site was secured with a 0-Prolene retention suture. The venotomy incision was closed with Dermabond and Steri-strips. Dressings were applied. The patient tolerated the procedure well without immediate post procedural complication. IMPRESSION: Successful placement of 28 cm tip to cuff tunneled hemodialysis catheter via the left internal jugular vein with tips terminating within the superior aspect of the right atrium. The catheter is ready for immediate use. Electronically Signed   By: Sandi Mariscal M.D.   On: 09/19/2019 15:25    Labs: BMET Recent Labs  Lab 09/15/19 0329 09/16/19 0438 09/17/19 0345 09/18/19 0336 09/19/19 0312 09/20/19 0333  09/21/19 0658  NA 137 135 138 139 136 136 136  K 4.4 3.9 4.5 4.0 4.9 4.6 4.8  CL 96* 94* 97* 97* 97* 98 96*  CO2 24 26 26 28 25 26 25   GLUCOSE 249* 191* 173* 62* 132* 173* 132*  BUN 110* 82* 121* 76* 114* 69* 103*  CREATININE 7.59* 5.38* 6.91* 4.98* 6.52* 4.93* 6.49*  CALCIUM 7.9* 8.0* 8.2* 8.6* 8.8* 8.7* 8.9  PHOS 9.2* 5.7* 6.6* 5.3* 6.9* 5.5* 6.3*   CBC Recent Labs  Lab 09/15/19 0329 09/15/19 0329 09/16/19 0438 09/16/19 0438 09/17/19 0345 09/18/19 0336 09/19/19 0312 09/21/19 0658  WBC 9.8   < > 10.1   < > 11.2* 13.1* 13.4* 12.9*  NEUTROABS 6.9  --  6.7  --  7.3  --   --   --   HGB 9.0*   < > 8.1*   < > 8.3* 8.8* 9.0* 9.3*  HCT 27.2*   < > 25.3*   < > 26.1* 28.1* 28.6* 29.6*  MCV 95.1   < > 97.3   < > 97.8 98.6 99.0 98.7  PLT 302   < > 318   < > 361 373 406* 403*   < > = values in this interval not displayed.    Medications:    .  stroke: mapping our early stages of recovery book   Does not apply Once  . alteplase  2 mg Intracatheter Once  . amiodarone  200 mg Per Tube BID  . arformoterol  15 mcg Nebulization BID  . aspirin  81 mg Per Tube Daily  . atorvastatin  40 mg Per Tube Daily  . bisacodyl  10  mg Rectal Daily  . chlorhexidine gluconate (MEDLINE KIT)  15 mL Mouth Rinse BID  . Chlorhexidine Gluconate Cloth  6 each Topical Q0600  . clonazePAM  0.5 mg Per Tube BID  . darbepoetin (ARANESP) injection - NON-DIALYSIS  150 mcg Subcutaneous Q Mon-1800  . feeding supplement (PRO-STAT SUGAR FREE 64)  60 mL Per Tube TID  . heparin sodium (porcine)      . insulin aspart  0-15 Units Subcutaneous Q4H  . insulin detemir  10 Units Subcutaneous BID  . mouth rinse  15 mL Mouth Rinse 10 times per day  . midodrine  5 mg Per Tube TID WC  . multivitamin  1 tablet Oral QHS  . oxyCODONE  5 mg Per Tube Q6H  . pantoprazole sodium  40 mg Per Tube Daily  . revefenacin  175 mcg Nebulization Daily  . sertraline  25 mg Per Tube Daily  . sevelamer carbonate  0.8 g Per Tube TID WC  .  trospium  20 mg Per Tube BID    Jannifer Hick MD [facility] Pager [phone]

## 2019-09-21 NOTE — Progress Notes (Signed)
Pt declined to get in recliner today stating he was tired from dialysis. RN assisted patient to exercise all limbs and had patient speak/count numbers to exercise his voice with PMV in place. Pt tolerated well. pts wife at bedside and very pleased with progress. Pt 's mood improved after exercise , bath and speaking valve. Pt even smiled a little (YAY). Ellamae Sia

## 2019-09-21 NOTE — Progress Notes (Signed)
NAME:  Christian [name], MRN:  [id number], DOB:  Oct 26, 1945, LOS: 76 ADMISSION DATE:  08/09/2019, CONSULTATION DATE:  08/28/2019 REFERRING MD:  Orvan Seen - TRH, CHIEF COMPLAINT:  Respiratory failure.    HPI/course in hospital   74 year old man who presented with progressive dyspnea.  He was admitted 4/14 and was incidentally found to have a right lower lobe pulmonary embolism.  Overall the picture was that of decompensated heart failure with a non-STEMI.  Coronary angiography revealed triple-vessel disease and the patient was referred for cardiac surgery.  EF was found to be reduced at that time.  He underwent four-vessel CABG 4/20 with good quality distal targets.  Cardiac function remained reduced post bypass.  Unable to close chest due to surrounding edema.  Underwent attempted closure with VAC placement 4/22.   Cardiac arrest 4/23 with repeated washouts with VAC placements on 4/26 and 4/29.  Significant volume overload postoperatively.  Acute kidney injury.  Currently on CRRT for fluid removal.   Sig Events: 08/09/19 Admitted with NSTEM and ? Small RLL PE. Weight 213 pounds.  08/15/19 CABG x4 on 08/15/19 08/18/19 Cardiac arrest due to tamponade. Chest opened for washout 4/26 Chest washout in OR 4/29 CVVH started 5/5 OR for chest washout, tracheostomy 5/6 Trach aspirate with pan sensitive pseudomonas (completed course of abx  5/10 repeat washout, unable to close 5/12 to OR for chest closure with muscle flap  5/13 transitioned to [facility] 5/14 Neurology consulted for left sided weakness- CTH neg 5/20 chest wall swelling > taken to OR for evacuation of hematoma and re-closure.  5/24 down sized to 6 trach  Micro: 4/14 SARS/ Flu A/B >> neg 4/19 MRSA PCR >> neg 4/22 BCx 2 >> neg 5/2 trach asp >> normal flora  5/2 BCx2 >> neg 5/6 trach asp >> pan sensitive pseudomonas  5/6 BCx2 >> neg 5/10 sternal wound cx >> neg 5/14 BCx 2 >> ngtd  5/14 trach asp >> normal flora   ABX: Cefepime  4/23  >> 5/6 vanc 4/20 >> 4/25; 4/29 >>5/4; 5/6 >>5/13 Fluconazole 5/7 >>5/14 Meropenem 5/7 >> 5/15  Imaging: CTA PE 4/14 >> small RLL PE Echo 4/24: EF 45% with Grade 2 DD [facility] 5/13 and 5/15 >> chronic microvascular ischemia, no acute intracranial abnormality  CT chest 5/24: BILATERAL pleural effusions and scattered atelectasis greatest in LEFT lower lobe.Pleural calcifications in hemithoraces bilaterally question asbestos exposure.Stranding of the anterior mediastinum likely related to prior cardiac surgery and sternal dehiscence, with herniation of fat through the sternotomy.Diffuse stranding along the RIGHT pectoralis major and minor muscles which could be related to prior surgery, hemorrhage, or infection. 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   LDA: L [facility] temporary HD line 5/7 >> R midline PIV 5/6 >> shiley trach 5/5 >> cortrak left nare >> Rectal tube >> Wound vac over sternum >>  Interim history/subjective:   No events.  Has some minor pain in mouth (nothing visible on exam).  Objective   Blood pressure 104/64, pulse (!) 109, temperature 98 F (36.7 C), resp. rate 16, height 5\' 9"  (1.753 m), weight 85.2 kg, SpO2 100 %.    FiO2 (%):  [28 %] 28 %   Intake/Output Summary (Last 24 hours) at 09/21/2019 0841 Last data filed at 09/21/2019 0600 Gross per 24 hour  Intake 1114.75 ml  Output 85 ml  Net 1029.75 ml   Filed Weights   09/20/19 0605 09/21/19 0600 09/21/19 0658  Weight: 84.6 kg 85.2 kg 85.2 kg    Examination:  GEN:  elderly man in NAD HEENT: Trach in place with minimal secretions CV: ext warm, heart sounds distant with wound vac in place, R subclavian fossa residual hematoma stable but TTP PULM: Diminished at bases, no wheezing GI: Soft, +BS EXT: Trivial edema NEURO: Profoundly weak but moves all 4 ext PSYCH: RASS 0, flat affect SKIN: No rashes, left tunneled catheter CDI  Resolved   Cardiac tamponade on 4/23: Status post open chest for washout and clot removal. 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  Restrictive chest physiology status post sternotomy, poor chest wall compliance.  Baseline PFTs with mixed restrictive and obstructive defect, FEV1 50% predicted prior to surgery. Downsized to cuffed shiley 6 5/24 -Cont routine trach care -Continue nebs as ordered -SLP working w/ him now cont to encourage PMV as tolerated.  -Mobilize, PT is paramount  DM- continue levemir and SSI  Afib- unclear when and if [facility] can be started; defer to discussions between [facility] and plastic surgery  Disposition per primary team. Will follow with you to see if we can work toward decannulation  Erskine Emery MD [facility] 09/21/2019 8:41 AM Personal pager: [phone] If unanswered, please page CCM On-call: [phone]

## 2019-09-22 LAB — CBC
HCT: 28.1 % — ABNORMAL LOW (ref 39.0–52.0)
Hemoglobin: 8.8 g/dL — ABNORMAL LOW (ref 13.0–17.0)
MCH: 31.2 pg (ref 26.0–34.0)
MCHC: 31.3 g/dL (ref 30.0–36.0)
MCV: 99.6 fL (ref 80.0–100.0)
Platelets: 357 10*3/uL (ref 150–400)
RBC: 2.82 MIL/uL — ABNORMAL LOW (ref 4.22–5.81)
RDW: 18.1 % — ABNORMAL HIGH (ref 11.5–15.5)
WBC: 14.2 10*3/uL — ABNORMAL HIGH (ref 4.0–10.5)
nRBC: 0.3 % — ABNORMAL HIGH (ref 0.0–0.2)

## 2019-09-22 LAB — RENAL FUNCTION PANEL
Albumin: 2.6 g/dL — ABNORMAL LOW (ref 3.5–5.0)
Anion gap: 13 (ref 5–15)
BUN: 72 mg/dL — ABNORMAL HIGH (ref 8–23)
CO2: 25 mmol/L (ref 22–32)
Calcium: 8.9 mg/dL (ref 8.9–10.3)
Chloride: 97 mmol/L — ABNORMAL LOW (ref 98–111)
Creatinine, Ser: 4.34 mg/dL — ABNORMAL HIGH (ref 0.61–1.24)
GFR calc Af Amer: 15 mL/min — ABNORMAL LOW (ref >60)
GFR calc non Af Amer: 13 mL/min — ABNORMAL LOW (ref >60)
Glucose, Bld: 153 mg/dL — ABNORMAL HIGH (ref 70–99)
Phosphorus: 4.5 mg/dL (ref 2.5–4.6)
Potassium: 4.2 mmol/L (ref 3.5–5.1)
Sodium: 135 mmol/L (ref 135–145)

## 2019-09-22 LAB — GLUCOSE, CAPILLARY
Glucose-Capillary: 118 mg/dL — ABNORMAL HIGH (ref 70–99)
Glucose-Capillary: 136 mg/dL — ABNORMAL HIGH (ref 70–99)
Glucose-Capillary: 146 mg/dL — ABNORMAL HIGH (ref 70–99)
Glucose-Capillary: 159 mg/dL — ABNORMAL HIGH (ref 70–99)
Glucose-Capillary: 168 mg/dL — ABNORMAL HIGH (ref 70–99)
Glucose-Capillary: 173 mg/dL — ABNORMAL HIGH (ref 70–99)
Glucose-Capillary: 182 mg/dL — ABNORMAL HIGH (ref 70–99)

## 2019-09-22 LAB — MAGNESIUM: Magnesium: 2.2 mg/dL (ref 1.7–2.4)

## 2019-09-22 NOTE — Progress Notes (Signed)
[facility] Progress Note     Assessment/ Plan:    1. AKI- (crt 1.22 on 07/24/19) following cardiac cath, cabg, cardiogenic shock, cardiac arrest due to tamponade, and acute on chronic CHF. Started CRRT on 08/24/19-5/12 due to extensive volume overload preventing chest closure.  1. On TTS schedule this week, successful HD today with more UF (gave albumin support to facilitate today).  Next HD would be due 5/29.   2. S/p RIJ [facility] 5/25 IR.  3. Renal recovery: nurse noted wet sheets today. Will do bladder scans and consider foley if UOP is present 2. CAD s/p CABG x 4 complicated by cardiac tamponade s/p reopened chest urgently on 08/18/19 with wound vac.most recent OR On 5/12and then 5/20 (evacuation of blood in chest) 3. Acute systolic heart failure due to ischemic cardiomyopathy- Working towards euvolemia with UF on HD.  4. Postoperative acute hypoxic respiratory failure on h/o COPD- reintubated 08/18/19 and likely aspiration. Now improved to  trach collar 5. Anemia- Hgbstable in 8s > 9. transfuse prn.On ESA- darbe 150 6. A fib per primary 7. Dispo - LTACH planned, discussion for PEG underway.   Subjective:   Tolerated dialysis w/o issues. Possibly wet sheets this morning per nurse so may have urinated. No complaints today.   Objective:   BP (!) 99/55   Pulse (!) 106   Temp 98.7 F (37.1 C)   Resp (!) 21   Ht 5\' 9"  (1.753 m)   Wt 82.4 kg   SpO2 100%   BMI 26.83 kg/m   Intake/Output Summary (Last 24 hours) at 09/22/2019 1112 Last data filed at 09/22/2019 1000 Gross per 24 hour  Intake 1325 ml  Output 70 ml  Net 1255 ml   Weight change: -2.8 kg  Physical Exam: Gen: in chair, nad CVS: Tachycardic Resp: coarse BS anteriorly, on TC Ext: 1-2+ pitting edema in all 4 extremities GU: no foley Access: LIJ TDC   Imaging: No results found.  Labs: BMET Recent Labs  Lab 09/16/19 0438 09/17/19 0345 09/18/19 0336 09/19/19 0312 09/20/19 0333 09/21/19 0658  09/22/19 0224  NA 135 138 139 136 136 136 135  K 3.9 4.5 4.0 4.9 4.6 4.8 4.2  CL 94* 97* 97* 97* 98 96* 97*  CO2 26 26 28 25 26 25 25   GLUCOSE 191* 173* 62* 132* 173* 132* 153*  BUN 82* 121* 76* 114* 69* 103* 72*  CREATININE 5.38* 6.91* 4.98* 6.52* 4.93* 6.49* 4.34*  CALCIUM 8.0* 8.2* 8.6* 8.8* 8.7* 8.9 8.9  PHOS 5.7* 6.6* 5.3* 6.9* 5.5* 6.3* 4.5   CBC Recent Labs  Lab 09/16/19 0438 09/16/19 0438 09/17/19 0345 09/17/19 0345 09/18/19 0336 09/19/19 0312 09/21/19 0658 09/22/19 0224  WBC 10.1   < > 11.2*   < > 13.1* 13.4* 12.9* 14.2*  NEUTROABS 6.7  --  7.3  --   --   --   --   --   HGB 8.1*   < > 8.3*   < > 8.8* 9.0* 9.3* 8.8*  HCT 25.3*   < > 26.1*   < > 28.1* 28.6* 29.6* 28.1*  MCV 97.3   < > 97.8   < > 98.6 99.0 98.7 99.6  PLT 318   < > 361   < > 373 406* 403* 357   < > = values in this interval not displayed.    Medications:    .  stroke: mapping our early stages of recovery book   Does not apply Once  .  alteplase  2 mg Intracatheter Once  . amiodarone  200 mg Per Tube BID  . arformoterol  15 mcg Nebulization BID  . aspirin  81 mg Per Tube Daily  . atorvastatin  40 mg Per Tube Daily  . bisacodyl  10 mg Rectal Daily  . chlorhexidine  15 mL Mouth Rinse BID  . Chlorhexidine Gluconate Cloth  6 each Topical Q0600  . clonazePAM  0.5 mg Per Tube BID  . darbepoetin (ARANESP) injection - NON-DIALYSIS  150 mcg Subcutaneous Q Mon-1800  . feeding supplement (PRO-STAT SUGAR FREE 64)  60 mL Per Tube TID  . insulin aspart  0-15 Units Subcutaneous Q4H  . insulin detemir  10 Units Subcutaneous BID  . mouth rinse  15 mL Mouth Rinse q12n4p  . midodrine  5 mg Per Tube TID WC  . multivitamin  1 tablet Oral QHS  . oxyCODONE  5 mg Per Tube Q6H  . pantoprazole sodium  40 mg Per Tube Daily  . revefenacin  175 mcg Nebulization Daily  . sertraline  25 mg Per Tube Daily  . sevelamer carbonate  0.8 g Per Tube TID WC  . trospium  20 mg Per Tube BID

## 2019-09-22 NOTE — Procedures (Signed)
Tracheostomy Change Note  Patient Details:   Name: ORLAN AVERSA DOB: 05/27/1945 MRN: [id number]    Airway Documentation:     Evaluation  O2 sats: stable throughout Complications: No apparent complications Patient did tolerate procedure well. Bilateral Breath Sounds: Clear, Diminished   Trach changed per MD order to a Shiley #4 cuffless. No apparent complications, positive color change noted and clear BBS. Sats and vitals stable throughout.   Esperanza Sheets T 09/22/2019, 2:07 PM

## 2019-09-22 NOTE — Progress Notes (Addendum)
NAME:  Christian [name], MRN:  [id number], DOB:  01-Mar-1946, LOS: 38 ADMISSION DATE:  08/09/2019, CONSULTATION DATE:  08/28/2019 REFERRING MD:  Orvan Seen - TRH, CHIEF COMPLAINT:  Respiratory failure.    HPI/course in hospital   74 year old man who presented with progressive dyspnea.  He was admitted 4/14 and was incidentally found to have a right lower lobe pulmonary embolism.  Overall the picture was that of decompensated heart failure with a non-STEMI.  Coronary angiography revealed triple-vessel disease and the patient was referred for cardiac surgery.  EF was found to be reduced at that time.  He underwent four-vessel CABG 4/20 with good quality distal targets.  Cardiac function remained reduced post bypass.  Unable to close chest due to surrounding edema.  Underwent attempted closure with VAC placement 4/22.   Cardiac arrest 4/23 with repeated washouts with VAC placements on 4/26 and 4/29.  Significant volume overload postoperatively.  Acute kidney injury.  Currently on CRRT for fluid removal.   Sig Events: 08/09/19 Admitted with NSTEM and ? Small RLL PE. Weight 213 pounds.  08/15/19 CABG x4 on 08/15/19 08/18/19 Cardiac arrest due to tamponade. Chest opened for washout 4/26 Chest washout in OR 4/29 CVVH started 5/5 OR for chest washout, tracheostomy 5/6 Trach aspirate with pan sensitive pseudomonas (completed course of abx  5/10 repeat washout, unable to close 5/12 to OR for chest closure with muscle flap  5/13 transitioned to [facility] 5/14 Neurology consulted for left sided weakness- CTH neg 5/20 chest wall swelling > taken to OR for evacuation of hematoma and re-closure.  5/24 down sized to 6 trach  Micro: 4/14 SARS/ Flu A/B >> neg 4/19 MRSA PCR >> neg 4/22 BCx 2 >> neg 5/2 trach asp >> normal flora  5/2 BCx2 >> neg 5/6 trach asp >> pan sensitive pseudomonas  5/6 BCx2 >> neg 5/10 sternal wound cx >> neg 5/14 BCx 2 >> ngtd  5/14 trach asp >> normal flora   ABX: Cefepime  4/23  >> 5/6 vanc 4/20 >> 4/25; 4/29 >>5/4; 5/6 >>5/13 Fluconazole 5/7 >>5/14 Meropenem 5/7 >> 5/15  Imaging: CTA PE 4/14 >> small RLL PE Echo 4/24: EF 45% with Grade 2 DD [facility] 5/13 and 5/15 >> chronic microvascular ischemia, no acute intracranial abnormality  CT chest 5/24: BILATERAL pleural effusions and scattered atelectasis greatest in LEFT lower lobe.Pleural calcifications in hemithoraces bilaterally question asbestos exposure.Stranding of the anterior mediastinum likely related to prior cardiac surgery and sternal dehiscence, with herniation of fat through the sternotomy.Diffuse stranding along the RIGHT pectoralis major and minor muscles which could be related to prior surgery, hemorrhage, or infection. 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   LDA: L [place] temporary HD line 5/7 >> R midline PIV 5/6 >> shiley trach 5/5 >> cortrak left nare >> Rectal tube >> Wound vac over sternum >>  Interim history/subjective:  See in f/u for post op respiratory failure Remains on TC Having some back itching.  Objective   Blood pressure 108/65, pulse (!) 108, temperature 98.7 F (37.1 C), resp. rate 20, height 5\' 9"  (1.753 m), weight 82.4 kg, SpO2 100 %.    FiO2 (%):  [28 %] 28 %   Intake/Output Summary (Last 24 hours) at 09/22/2019 0843 Last data filed at 09/22/2019 0600 Gross per 24 hour  Intake 1422 ml  Output 3060 ml  Net -1638 ml   Filed Weights   09/21/19 0600 09/21/19 0658 09/21/19 1040  Weight: 85.2 kg 85.2 kg 82.4 kg    Examination:  GEN: elderly man in NAD HEENT: Trach in place with PMV CV: ext warm, heart sounds distant with wound vac in place, R subclavian fossa residual hematoma stable but TTP PULM: Diminished at bases, no wheezing GI: Soft, +BS EXT: Trivial edema NEURO: Profoundly weak but moves all 4 ext PSYCH: RASS 0, in better spirits SKIN: No rashes, left tunneled catheter CDI  Resolved   Cardiac tamponade on 4/23: Status post open chest for washout and clot removal. 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  Restrictive chest physiology status post sternotomy, poor chest wall compliance.  Baseline PFTs with mixed restrictive and obstructive defect, FEV1 50% predicted prior to surgery. Downsized to cuffed shiley 6 5/24 -Cont routine trach care -Continue nebs as ordered -SLP working w/ him now cont to encourage PMV as tolerated.  -downsize to 4 cuffless trach -Mobilize, PT is paramount  DM- continue levemir and SSI  Afib- unclear when and if [facility] can be started; defer to discussions between TCTS and plastic surgery  Disposition per primary team.  Will follow with you to see if we can work toward decannulation Will check on again Monday if he's still here and consider capping.  Erskine Emery MD [facility] 09/22/2019 8:43 AM Personal pager: [phone] If unanswered, please page CCM On-call: [phone]

## 2019-09-22 NOTE — Progress Notes (Signed)
8 Days Post-Op  Subjective: No complaints. Wife at bedside.  No f/c/n/v  Right JP drain in place - 35 cc output today.  Objective: Vital signs in last 24 hours: Temp:  [97.3 F (36.3 C)-98.7 F (37.1 C)] 98.3 F (36.8 C) (05/28 1532) Pulse Rate:  [101-109] 105 (05/28 1532) Resp:  [16-30] 24 (05/28 1532) BP: (94-140)/(55-93) 140/78 (05/28 1500) SpO2:  [99 %-100 %] 100 % (05/28 1532) FiO2 (%):  [28 %] 28 % (05/28 1532) Last BM Date: 09/22/19  Intake/Output from previous day: 05/27 0701 - 05/28 0700 In: 1467 [NG/GT:1425; IV Piggyback:42] Out: 3060 [Drains:60] Intake/Output this shift: Total I/O In: 195 [NG/GT:195] Out: 10 [Drains:10]  General appearance: alert, cooperative, no distress, and lying in recliner at bedside, wife at pt side Head: Normocephalic, without obvious abnormality, atraumatic Nose: NG tube in place Neck: Trach collar in place Resp: Unlabored Chest wall: Right JP drain in place - dark sanguinous fluid in bulb, multiple incisions over lower chest wall from previous tubes/jp drains. Sternal incision in-tact, no large dehisence noted, no drainage noted, no peri-incisional erythema..   Lab Results:  CBC Latest Ref Rng & Units 09/22/2019 09/21/2019 09/19/2019  WBC 4.0 - 10.5 K/uL 14.2(H) 12.9(H) 13.4(H)  Hemoglobin 13.0 - 17.0 g/dL 8.8(L) 9.3(L) 9.0(L)  Hematocrit 39.0 - 52.0 % 28.1(L) 29.6(L) 28.6(L)  Platelets 150 - 400 K/uL 357 403(H) 406(H)    BMET Recent Labs    09/21/19 0658 09/22/19 0224  NA 136 135  K 4.8 4.2  CL 96* 97*  CO2 25 25  GLUCOSE 132* 153*  BUN 103* 72*  CREATININE 6.49* 4.34*  CALCIUM 8.9 8.9   PT/INR No results for input(s): LABPROT, INR in the last 72 hours. ABG No results for input(s): PHART, HCO3 in the last 72 hours.  Invalid input(s): PCO2, PO2  Studies/Results: No results found.  Anti-infectives: Anti-infectives (From admission, onward)    Start     Dose/Rate Route Frequency Ordered Stop   09/19/19 1417   ceFAZolin (ANCEF) 2-4 GM/100ML-% IVPB    Note to Pharmacy: Rudene Re   : cabinet override      09/19/19 1417 09/20/19 0229   09/19/19 1330  ceFAZolin (ANCEF) IVPB 1 g/50 mL premix  Status:  Discontinued     1 g 100 mL/hr over 30 Minutes Intravenous Once 09/19/19 1317 09/19/19 1329   09/19/19 1330  ceFAZolin (ANCEF) IVPB 2g/100 mL premix     2 g 200 mL/hr over 30 Minutes Intravenous  Once 09/19/19 1327 09/19/19 1448   09/14/19 0915  vancomycin (VANCOCIN) IVPB 1000 mg/200 mL premix     1,000 mg 200 mL/hr over 60 Minutes Intravenous To Surgery 09/14/19 0907 09/15/19 0915   09/07/19 2000  vancomycin (VANCOREADY) IVPB 750 mg/150 mL     750 mg 150 mL/hr over 60 Minutes Intravenous  Once 09/07/19 1510 09/07/19 2245   09/06/19 2200  meropenem (MERREM) 500 mg in sodium chloride 0.9 % 100 mL IVPB     500 mg 200 mL/hr over 30 Minutes Intravenous Every 24 hours 09/06/19 1542 09/09/19 0015   09/06/19 2000  fluconazole (DIFLUCAN) IVPB 400 mg     400 mg 100 mL/hr over 120 Minutes Intravenous Every 24 hours 09/06/19 1542 09/08/19 2240   09/06/19 1800  vancomycin (VANCOREADY) IVPB 750 mg/150 mL     750 mg 150 mL/hr over 60 Minutes Intravenous  Once 09/06/19 1542 09/07/19 0022   09/06/19 1540  vancomycin variable dose per unstable renal function (pharmacist dosing)  Does not apply See admin instructions 09/06/19 1542 09/08/19 2359   09/06/19 1100  ceFAZolin (ANCEF) IVPB 2g/100 mL premix  Status:  Discontinued     2 g 200 mL/hr over 30 Minutes Intravenous 30 min pre-op 09/05/19 1717 09/06/19 1644   09/06/19 0600  ceFAZolin (ANCEF) IVPB 2g/100 mL premix  Status:  Discontinued     2 g 200 mL/hr over 30 Minutes Intravenous To Short Stay 09/06/19 0546 09/06/19 1644   09/02/19 2200  meropenem (MERREM) 1 g in sodium chloride 0.9 % 100 mL IVPB  Status:  Discontinued     1 g 200 mL/hr over 30 Minutes Intravenous Every 8 hours 09/02/19 1011 09/06/19 1542   09/02/19 2000  fluconazole (DIFLUCAN) IVPB  800 mg  Status:  Discontinued     800 mg 100 mL/hr over 240 Minutes Intravenous Every 24 hours 09/02/19 1011 09/06/19 1542   09/02/19 2000  fluconazole (DIFLUCAN) IVPB 800 mg  Status:  Discontinued     800 mg 200 mL/hr over 120 Minutes Intravenous Every 24 hours 09/02/19 1329 09/02/19 1330   09/02/19 1800  vancomycin (VANCOCIN) IVPB 1000 mg/200 mL premix  Status:  Discontinued     1,000 mg 200 mL/hr over 60 Minutes Intravenous Every 24 hours 09/02/19 1011 09/06/19 1542   09/02/19 0900  meropenem (MERREM) 1 g in sodium chloride 0.9 % 100 mL IVPB  Status:  Discontinued     1 g 200 mL/hr over 30 Minutes Intravenous Every 24 hours 09/01/19 1333 09/02/19 1011   09/01/19 1700  fluconazole (DIFLUCAN) IVPB 800 mg     800 mg 100 mL/hr over 240 Minutes Intravenous  Once 09/01/19 1645 09/01/19 2057   09/01/19 1630  fluconazole (DIFLUCAN) IVPB 400 mg  Status:  Discontinued     400 mg 100 mL/hr over 120 Minutes Intravenous  Once 09/01/19 1629 09/01/19 1645   09/01/19 1330  vancomycin variable dose per unstable renal function (pharmacist dosing)  Status:  Discontinued      Does not apply See admin instructions 09/01/19 1333 09/02/19 1325   09/01/19 0730  meropenem (MERREM) 1 g in sodium chloride 0.9 % 100 mL IVPB  Status:  Discontinued     1 g 200 mL/hr over 30 Minutes Intravenous Every 8 hours 09/01/19 0729 09/01/19 1327   08/31/19 2200  vancomycin (VANCOCIN) IVPB 1000 mg/200 mL premix  Status:  Discontinued     1,000 mg 200 mL/hr over 60 Minutes Intravenous Every 24 hours 08/30/19 2235 09/01/19 1330   08/25/19 0800  vancomycin (VANCOREADY) IVPB 1250 mg/250 mL  Status:  Discontinued     1,250 mg 166.7 mL/hr over 90 Minutes Intravenous Every 24 hours 08/24/19 2121 08/24/19 2128   08/25/19 0100  vancomycin (VANCOREADY) IVPB 1250 mg/250 mL  Status:  Discontinued     1,250 mg 166.7 mL/hr over 90 Minutes Intravenous Every 24 hours 08/24/19 2128 08/30/19 2235   08/24/19 2300  ceFEPIme (MAXIPIME) 2 g in  sodium chloride 0.9 % 100 mL IVPB  Status:  Discontinued     2 g 200 mL/hr over 30 Minutes Intravenous Every 12 hours 08/24/19 2150 09/01/19 0729   08/24/19 1338  vancomycin (VANCOCIN) 1,000 mg in sodium chloride 0.9 % 1,000 mL irrigation  Status:  Discontinued       As needed 08/24/19 1338 08/24/19 1508   08/21/19 0000  ceFEPIme (MAXIPIME) 1 g in sodium chloride 0.9 % 100 mL IVPB  Status:  Discontinued     1 g 200 mL/hr over  30 Minutes Intravenous Every 24 hours 08/20/19 0954 08/24/19 2150   08/20/19 1200  vancomycin (VANCOCIN) IVPB 1000 mg/200 mL premix     1,000 mg 200 mL/hr over 60 Minutes Intravenous  Once 08/20/19 0954 08/21/19 0011   08/19/19 2129  vancomycin variable dose per unstable renal function (pharmacist dosing)  Status:  Discontinued      Does not apply See admin instructions 08/19/19 2129 08/24/19 2128   08/19/19 0000  ceFEPIme (MAXIPIME) 2 g in sodium chloride 0.9 % 100 mL IVPB  Status:  Discontinued     2 g 200 mL/hr over 30 Minutes Intravenous Every 24 hours 08/18/19 2351 08/20/19 0954   08/19/19 0000  vancomycin (VANCOREADY) IVPB 2000 mg/400 mL     2,000 mg 200 mL/hr over 120 Minutes Intravenous  Once 08/18/19 2351 08/19/19 0354   08/16/19 1731  vancomycin (VANCOCIN) powder  Status:  Discontinued       As needed 08/16/19 1732 08/16/19 1808   08/15/19 2045  vancomycin (VANCOCIN) IVPB 1000 mg/200 mL premix     1,000 mg 200 mL/hr over 60 Minutes Intravenous  Once 08/15/19 1402 08/15/19 2209   08/15/19 1645  cefUROXime (ZINACEF) 1.5 g in sodium chloride 0.9 % 100 mL IVPB     1.5 g 200 mL/hr over 30 Minutes Intravenous Every 12 hours 08/15/19 1402 08/17/19 0700   08/15/19 0958  vancomycin (VANCOCIN) powder  Status:  Discontinued       As needed 08/15/19 1000 08/15/19 1402   08/15/19 0400  vancomycin (VANCOREADY) IVPB 1500 mg/300 mL     1,500 mg 150 mL/hr over 120 Minutes Intravenous To Surgery 08/14/19 1121 08/15/19 0830   08/15/19 0400  cefUROXime (ZINACEF) 1.5 g in  sodium chloride 0.9 % 100 mL IVPB     1.5 g 200 mL/hr over 30 Minutes Intravenous To Surgery 08/14/19 1121 08/15/19 0812   08/15/19 0400  cefUROXime (ZINACEF) 750 mg in sodium chloride 0.9 % 100 mL IVPB     750 mg 200 mL/hr over 30 Minutes Intravenous To Surgery 08/14/19 1121 08/15/19 1300       Assessment/Plan: s/p Procedure(s): EVACUATION OF HEMATOMA  Right JP drain with 35 cc output total today - drain removed - no fluid collections noted. Patient to go to rehab per primary team.  Will continue to follow.   LOS: 44 days    Charlies Constable, PA-C 09/22/2019

## 2019-09-22 NOTE — Progress Notes (Signed)
OT Cancellation Note  Patient Details Name: GLENNON KOPKO MRN: [id number] DOB: 07-Mar-1946   Cancelled Treatment:    Reason Eval/Treat Not Completed: Patient at procedure or test/ unavailable  Malka So 09/22/2019, 1:18 PM  Nestor Lewandowsky, OTR/L Acute Rehabilitation Services Pager: [phone] Office: [phone]

## 2019-09-22 NOTE — Progress Notes (Signed)
Physical Therapy Treatment Patient Details Name: Christian [name] MRN: [id number] DOB: May 08, 1945 Today's Date: 09/22/2019    History of Present Illness 74 yo admitted with NSTEMI 4/15, RLL PE, 4/20 CABG x 4 remained open with wound VAC with repeated washouts. 4/23 cardiac arrest, 4/ 29 initiated CRRT, vent s/p trach. 5/10 additional sternal washout unable to close.  5/12 sternal plating with pectoralis flap and omental harvest to mediastinum off CRRT with iHD started. Chest wound closed. Neurology consulted for left sided weakness- CTH neg 09/08/19. 5/20 pt with emergent hematoma evacuation and transition to trach collar. 5/24 down sized to 6 trach. PMHx: BPH, CAD, DM, HLD, HTN    PT Comments    Pt in chair on arrival, awakens when spoken too. Pt with flat affect and periods of blank stare unsure if pt with only delayed processing or other factors at play. Pt continues to require max +2 assist to stand from elevated surfaces and demonstrates decreased strength and movement of all extremities this session. Encouraged continued HEP when not working with therapy and nursing promoting OOB to chair with lift.   HR 102 SpO2 100% on trach collar  5L, 28% FiO2   Follow Up Recommendations  LTACH;Supervision/Assistance - 24 hour     Equipment Recommendations  Other (comment)(TBD at next venue)    Recommendations for Other Services       Precautions / Restrictions Precautions Precautions: Fall;Sternal Precaution Comments: trach, sternal flap, jp drains, flexiseal    Mobility  Bed Mobility               General bed mobility comments: pt in chair on arrival and end of session  Transfers Overall transfer level: Needs assistance   Transfers: Sit to/from Stand Sit to Stand: Max assist;+2 physical assistance         General transfer comment: pt in chair on arrival and unable to perform significant mobility from low position. Lifted pt from chair to bed with maxisky. Performed  sitting balance and 2 standing trials with bil knee blocked with max +2 assist. Pt then lifted back to chair and positioned in midline  Ambulation/Gait             General Gait Details: unable   Stairs             Wheelchair Mobility    Modified Rankin (Stroke Patients Only)       Balance Overall balance assessment: Needs assistance   Sitting balance-Leahy Scale: Poor Sitting balance - Comments: pt with anterior left lean EOB and in chair with assist to maintain trunk EOB and pillows for positioning in chair. pt unable to recognize lean Postural control: Left lateral lean   Standing balance-Leahy Scale: Zero                              Cognition Arousal/Alertness: Awake/alert Behavior During Therapy: Flat affect Overall Cognitive Status: Difficult to assess                         Following Commands: Follows one step commands inconsistently       General Comments: pt mouthing responses and nodding head. Pt at times speaking with PMSV but requires cueing to utilize voice and difficult to understand. Pt required verbal and tactile cues for all mobility.      Exercises General Exercises - Lower Extremity Long Arc Quad: AAROM;Both;Seated;10 reps Hip Flexion/Marching: AAROM;Both;Seated;10 reps  General Comments        Pertinent Vitals/Pain Pain Assessment: Faces Faces Pain Scale: Hurts little more Pain Location: chest Pain Descriptors / Indicators: Discomfort;Grimacing Pain Intervention(s): Limited activity within patient's tolerance;Repositioned;Monitored during session    Home Living                      Prior Function            PT Goals (current goals can now be found in the care plan section) Progress towards PT goals: Progressing toward goals(limited progression)    Frequency    Min 3X/week      PT Plan Current plan remains appropriate    Co-evaluation              AM-PAC PT "6 Clicks"  Mobility   Outcome Measure  Help needed turning from your back to your side while in a flat bed without using bedrails?: A Lot Help needed moving from lying on your back to sitting on the side of a flat bed without using bedrails?: Total Help needed moving to and from a bed to a chair (including a wheelchair)?: Total Help needed standing up from a chair using your arms (e.g., wheelchair or bedside chair)?: Total Help needed to walk in hospital room?: Total Help needed climbing 3-5 steps with a railing? : Total 6 Click Score: 7    End of Session Equipment Utilized During Treatment: Oxygen Activity Tolerance: Patient tolerated treatment well Patient left: in chair;with call bell/phone within reach;with chair alarm set;with family/visitor present Nurse Communication: Mobility status;Need for lift equipment PT Visit Diagnosis: Other abnormalities of gait and mobility (R26.89);Muscle weakness (generalized) (M62.81);Other symptoms and signs involving the nervous system (R29.898)     Time: 2831-5176 PT Time Calculation (min) (ACUTE ONLY): 30 min  Charges:  $Therapeutic Exercise: 8-22 mins $Therapeutic Activity: 8-22 mins                     Tascha Casares P, PT Acute Rehabilitation Services Pager: [phone] Office: [place] 09/22/2019, 1:38 PM

## 2019-09-23 ENCOUNTER — Inpatient Hospital Stay (HOSPITAL_COMMUNITY): Payer: Medicare Other

## 2019-09-23 LAB — SURGICAL PCR SCREEN
MRSA, PCR: NEGATIVE
Staphylococcus aureus: NEGATIVE

## 2019-09-23 LAB — CBC
HCT: 28.9 % — ABNORMAL LOW (ref 39.0–52.0)
Hemoglobin: 9.2 g/dL — ABNORMAL LOW (ref 13.0–17.0)
MCH: 31.7 pg (ref 26.0–34.0)
MCHC: 31.8 g/dL (ref 30.0–36.0)
MCV: 99.7 fL (ref 80.0–100.0)
Platelets: 364 10*3/uL (ref 150–400)
RBC: 2.9 MIL/uL — ABNORMAL LOW (ref 4.22–5.81)
RDW: 18.2 % — ABNORMAL HIGH (ref 11.5–15.5)
WBC: 15.1 10*3/uL — ABNORMAL HIGH (ref 4.0–10.5)
nRBC: 0.2 % (ref 0.0–0.2)

## 2019-09-23 LAB — RENAL FUNCTION PANEL
Albumin: 2.6 g/dL — ABNORMAL LOW (ref 3.5–5.0)
Anion gap: 15 (ref 5–15)
BUN: 118 mg/dL — ABNORMAL HIGH (ref 8–23)
CO2: 25 mmol/L (ref 22–32)
Calcium: 9 mg/dL (ref 8.9–10.3)
Chloride: 94 mmol/L — ABNORMAL LOW (ref 98–111)
Creatinine, Ser: 5.92 mg/dL — ABNORMAL HIGH (ref 0.61–1.24)
GFR calc Af Amer: 10 mL/min — ABNORMAL LOW (ref >60)
GFR calc non Af Amer: 9 mL/min — ABNORMAL LOW (ref >60)
Glucose, Bld: 146 mg/dL — ABNORMAL HIGH (ref 70–99)
Phosphorus: 5.3 mg/dL — ABNORMAL HIGH (ref 2.5–4.6)
Potassium: 3.9 mmol/L (ref 3.5–5.1)
Sodium: 134 mmol/L — ABNORMAL LOW (ref 135–145)

## 2019-09-23 LAB — MAGNESIUM: Magnesium: 2.5 mg/dL — ABNORMAL HIGH (ref 1.7–2.4)

## 2019-09-23 LAB — GLUCOSE, CAPILLARY
Glucose-Capillary: 150 mg/dL — ABNORMAL HIGH (ref 70–99)
Glucose-Capillary: 160 mg/dL — ABNORMAL HIGH (ref 70–99)
Glucose-Capillary: 197 mg/dL — ABNORMAL HIGH (ref 70–99)
Glucose-Capillary: 143 mg/dL — ABNORMAL HIGH (ref 70–99)
Glucose-Capillary: 203 mg/dL — ABNORMAL HIGH (ref 70–99)
Glucose-Capillary: 196 mg/dL — ABNORMAL HIGH (ref 70–99)

## 2019-09-23 MED ORDER — MUPIROCIN 2 % EX OINT
1.0000 "application " | TOPICAL_OINTMENT | Freq: Two times a day (BID) | CUTANEOUS | Status: DC
  Administered 2019-09-23: 1 via NASAL
  Filled 2019-09-23: qty 22

## 2019-09-23 NOTE — Progress Notes (Signed)
[facility] Progress Note     Assessment/ Plan:    1. AKI- (crt 1.22 on 07/24/19) following cardiac cath, cabg, cardiogenic shock, cardiac arrest due to tamponade, and acute on chronic CHF. Started CRRT on 08/24/19-5/12 due to extensive volume overload preventing chest closure.  1. On TTS schedule this week, will plan for dialysis again today then return to Monday Wednesday Friday schedule 2. S/p RIJ TDC 5/25 IR.  3. Renal recovery: nurse noted wet sheets yesterday.  Will do bladder scans every shift 2. CAD s/p CABG x 4 complicated by cardiac tamponade s/p reopened chest urgently on 08/18/19 with wound vac.most recent OR On 5/12and then 5/20 (evacuation of blood in chest) 3. Acute systolic heart failure due to ischemic cardiomyopathy- Working towards euvolemia with UF on HD.  4. Postoperative acute hypoxic respiratory failure on h/o COPD- reintubated 08/18/19 and likely aspiration. Now improved to  trach collar 5. Anemia- Hgbstable in 8s > 9. transfuse prn.On ESA- darbe 150 6. A fib per primary 7. Dispo - LTACH planned, discussion for PEG underway.   Subjective:   Patient has no complaints today.  No changes   Objective:   BP 117/68   Pulse (!) 101   Temp 98.5 F (36.9 C) (Oral)   Resp 17   Ht 5\' 9"  (1.753 m)   Wt 86.3 kg   SpO2 100%   BMI 28.10 kg/m   Intake/Output Summary (Last 24 hours) at 09/23/2019 1059 Last data filed at 09/23/2019 0600 Gross per 24 hour  Intake 1035 ml  Output 50 ml  Net 985 ml   Weight change: 3.9 kg  Physical Exam: Gen: In bed, no distress CVS: Tachycardic Resp: coarse BS anteriorly, on TC Ext: 1-2+ pitting edema in all 4 extremities GU: no foley Access: [facility]   Imaging: DG Chest Port 1 View  Result Date: 09/23/2019 CLINICAL DATA:  History of recent mediastinal expiration EXAM: PORTABLE CHEST 1 VIEW COMPARISON:  09/18/2019 FINDINGS: Postsurgical changes are again seen and stable. New left jugular dialysis catheter is  noted with the tip in the right atrium. Tracheostomy tube and feeding catheter are again seen. Slight increased density is noted in the left hemithorax likely related to posterior effusion. No focal infiltrate is noted. No pneumothorax is seen. IMPRESSION: Left-sided pleural effusion. New left jugular dialysis catheter. Remainder of the exam is stable. Electronically Signed   By: Inez Catalina M.D.   On: 09/23/2019 09:22    Labs: BMET Recent Labs  Lab 09/17/19 0345 09/18/19 0336 09/19/19 0312 09/20/19 0333 09/21/19 0658 09/22/19 0224 09/23/19 0238  NA 138 139 136 136 136 135 134*  K 4.5 4.0 4.9 4.6 4.8 4.2 3.9  CL 97* 97* 97* 98 96* 97* 94*  CO2 26 28 25 26 25 25 25   GLUCOSE 458* 62* 132* 173* 132* 153* 146*  BUN 121* 76* 114* 69* 103* 72* 118*  CREATININE 6.91* 4.98* 6.52* 4.93* 6.49* 4.34* 5.92*  CALCIUM 8.2* 8.6* 8.8* 8.7* 8.9 8.9 9.0  PHOS 6.6* 5.3* 6.9* 5.5* 6.3* 4.5 5.3*   CBC Recent Labs  Lab 09/17/19 0345 09/18/19 0336 09/19/19 0312 09/21/19 0658 09/22/19 0224 09/23/19 0238  WBC 11.2*   < > 13.4* 12.9* 14.2* 15.1*  NEUTROABS 7.3  --   --   --   --   --   HGB 8.3*   < > 9.0* 9.3* 8.8* 9.2*  HCT 26.1*   < > 28.6* 29.6* 28.1* 28.9*  MCV 97.8   < >  99.0 98.7 99.6 99.7  PLT 361   < > 406* 403* 357 364   < > = values in this interval not displayed.    Medications:    .  stroke: mapping our early stages of recovery book   Does not apply Once  . alteplase  2 mg Intracatheter Once  . amiodarone  200 mg Per Tube BID  . arformoterol  15 mcg Nebulization BID  . aspirin  81 mg Per Tube Daily  . atorvastatin  40 mg Per Tube Daily  . bisacodyl  10 mg Rectal Daily  . chlorhexidine  15 mL Mouth Rinse BID  . Chlorhexidine Gluconate Cloth  6 each Topical Q0600  . clonazePAM  0.5 mg Per Tube BID  . darbepoetin (ARANESP) injection - NON-DIALYSIS  150 mcg Subcutaneous Q Mon-1800  . feeding supplement (PRO-STAT SUGAR FREE 64)  60 mL Per Tube TID  . insulin aspart  0-15 Units  Subcutaneous Q4H  . insulin detemir  10 Units Subcutaneous BID  . mouth rinse  15 mL Mouth Rinse q12n4p  . midodrine  5 mg Per Tube TID WC  . multivitamin  1 tablet Oral QHS  . oxyCODONE  5 mg Per Tube Q6H  . pantoprazole sodium  40 mg Per Tube Daily  . revefenacin  175 mcg Nebulization Daily  . sertraline  25 mg Per Tube Daily  . sevelamer carbonate  0.8 g Per Tube TID WC  . trospium  20 mg Per Tube BID

## 2019-09-23 NOTE — Progress Notes (Signed)
      [address]       [place] [address]             [phone]                 9 Days Post-Op Procedure(s) (LRB): EVACUATION OF HEMATOMA (N/A)   Events: No events _______________________________________________________________ Vitals: BP 118/61   Pulse (!) 102   Temp 98.5 F (36.9 C) (Oral)   Resp 15   Ht 5\' 9"  (1.753 m)   Wt 86.3 kg   SpO2 100%   BMI 28.10 kg/m   - Neuro: Resting comfortably  - Cardiovascular: Sinus  Drips: None.      - Pulm: Trach collar FiO2 (%):  [28 %] 28 %  ABG    Component Value Date/Time   PHART 7.481 (H) 09/07/2019 0029   PCO2ART 31.4 (L) 09/07/2019 0029   PO2ART 125 (H) 09/07/2019 0029   HCO3 23.3 09/07/2019 0029   TCO2 24 09/07/2019 0029   ACIDBASEDEF 5.0 (H) 08/18/2019 2341   O2SAT 99.0 09/07/2019 0029    - Abd: Soft - Extremity: Warm  .Intake/Output      05/28 0701 - 05/29 0700 05/29 0701 - 05/30 0700   NG/GT 1170 180   IV Piggyback     Total Intake(mL/kg) 1170 (13.6) 180 (2.1)   Urine (mL/kg/hr) 0 (0)    Drains 35    Other     Stool 25    Total Output 60    Net +1110 +180        Urine Occurrence 1 x       _______________________________________________________________ Labs: CBC Latest Ref Rng & Units 09/23/2019 09/22/2019 09/21/2019  WBC 4.0 - 10.5 K/uL 15.1(H) 14.2(H) 12.9(H)  Hemoglobin 13.0 - 17.0 g/dL 9.2(L) 8.8(L) 9.3(L)  Hematocrit 39.0 - 52.0 % 28.9(L) 28.1(L) 29.6(L)  Platelets 150 - 400 K/uL 364 357 403(H)   CMP Latest Ref Rng & Units 09/23/2019 09/22/2019 09/21/2019  Glucose 70 - 99 mg/dL 146(H) 153(H) 132(H)  BUN 8 - 23 mg/dL 118(H) 72(H) 103(H)  Creatinine 0.61 - 1.24 mg/dL 5.92(H) 4.34(H) 6.49(H)  Sodium 135 - 145 mmol/L 134(L) 135 136  Potassium 3.5 - 5.1 mmol/L 3.9 4.2 4.8  Chloride 98 - 111 mmol/L 94(L) 97(L) 96(L)  CO2 22 - 32 mmol/L 25 25 25   Calcium 8.9 - 10.3 mg/dL 9.0 8.9 8.9  Total Protein 6.5 - 8.1 g/dL - - -  Total Bilirubin 0.3 - 1.2 mg/dL - - -  Alkaline Phos  38 - 126 U/L - - -  AST 15 - 41 U/L - - -  ALT 0 - 44 U/L - - -    CXR: Stable  _______________________________________________________________  Assessment and Plan: Status post CABG, cardiac arrest due to tamponade and open chest.  Stress has since been closed. Tolerating trach collar Awaiting placement to LTAC facility.  Melodie Bouillon, MD 09/23/2019 12:16 PM

## 2019-09-23 NOTE — Progress Notes (Signed)
Pt taken outside in chair, on ecg monitor, O2 via Trach collar on portable tank with myself, NT, and pt's wife.  VSS, no distress.  NSR, O2 sats 96-98%.  Pt with very flat affect.  After much encouragement, he agreed to the chair ride to [employer] area.  No change in affect during or after arrival to room.  Upon arrival back to room, pt remain in chair.

## 2019-09-23 NOTE — Progress Notes (Signed)
Paged Dr Caryl Comes, per order with BMP results.

## 2019-09-23 NOTE — Progress Notes (Signed)
Notified Anderson Malta, RN  At 1215 that HD treatment will be 09/24/2019 instead of 09/23/2019 per Dr Joylene Grapes

## 2019-09-23 NOTE — Progress Notes (Signed)
Bladder scan showed 90ml 

## 2019-09-24 LAB — RENAL FUNCTION PANEL
Albumin: 2.6 g/dL — ABNORMAL LOW (ref 3.5–5.0)
Anion gap: 17 — ABNORMAL HIGH (ref 5–15)
BUN: 157 mg/dL — ABNORMAL HIGH (ref 8–23)
CO2: 25 mmol/L (ref 22–32)
Calcium: 9 mg/dL (ref 8.9–10.3)
Chloride: 91 mmol/L — ABNORMAL LOW (ref 98–111)
Creatinine, Ser: 7 mg/dL — ABNORMAL HIGH (ref 0.61–1.24)
GFR calc Af Amer: 8 mL/min — ABNORMAL LOW (ref >60)
GFR calc non Af Amer: 7 mL/min — ABNORMAL LOW (ref >60)
Glucose, Bld: 159 mg/dL — ABNORMAL HIGH (ref 70–99)
Phosphorus: 6 mg/dL — ABNORMAL HIGH (ref 2.5–4.6)
Potassium: 4.3 mmol/L (ref 3.5–5.1)
Sodium: 133 mmol/L — ABNORMAL LOW (ref 135–145)

## 2019-09-24 LAB — CBC
HCT: 28.9 % — ABNORMAL LOW (ref 39.0–52.0)
Hemoglobin: 9.1 g/dL — ABNORMAL LOW (ref 13.0–17.0)
MCH: 31.1 pg (ref 26.0–34.0)
MCHC: 31.5 g/dL (ref 30.0–36.0)
MCV: 98.6 fL (ref 80.0–100.0)
Platelets: 354 10*3/uL (ref 150–400)
RBC: 2.93 MIL/uL — ABNORMAL LOW (ref 4.22–5.81)
RDW: 18.4 % — ABNORMAL HIGH (ref 11.5–15.5)
WBC: 14.9 10*3/uL — ABNORMAL HIGH (ref 4.0–10.5)
nRBC: 0.1 % (ref 0.0–0.2)

## 2019-09-24 LAB — GLUCOSE, CAPILLARY
Glucose-Capillary: 138 mg/dL — ABNORMAL HIGH (ref 70–99)
Glucose-Capillary: 149 mg/dL — ABNORMAL HIGH (ref 70–99)
Glucose-Capillary: 169 mg/dL — ABNORMAL HIGH (ref 70–99)
Glucose-Capillary: 192 mg/dL — ABNORMAL HIGH (ref 70–99)
Glucose-Capillary: 198 mg/dL — ABNORMAL HIGH (ref 70–99)
Glucose-Capillary: 202 mg/dL — ABNORMAL HIGH (ref 70–99)

## 2019-09-24 LAB — MAGNESIUM: Magnesium: 3 mg/dL — ABNORMAL HIGH (ref 1.7–2.4)

## 2019-09-24 MED ORDER — HEPARIN SODIUM (PORCINE) 1000 UNIT/ML IJ SOLN
4200.0000 [IU] | Freq: Once | INTRAMUSCULAR | Status: AC
  Administered 2019-09-24: 4200 [IU]

## 2019-09-24 MED ORDER — GERHARDT'S BUTT CREAM
TOPICAL_CREAM | Freq: Four times a day (QID) | CUTANEOUS | Status: DC | PRN
  Administered 2019-09-25 – 2019-09-27 (×2): 1 via TOPICAL
  Filled 2019-09-24 (×3): qty 1

## 2019-09-24 NOTE — Progress Notes (Signed)
      [address]       [place] [address]             [phone]                 10 Days Post-Op Procedure(s) (LRB): EVACUATION OF HEMATOMA (N/A)   Events: No events _______________________________________________________________ Vitals: BP 129/72   Pulse 100   Temp 98.4 F (36.9 C) (Oral)   Resp (!) 25   Ht 5\' 9"  (1.753 m)   Wt 86.4 kg   SpO2 100%   BMI 28.13 kg/m   - Neuro: Resting comfortably  - Cardiovascular: Sinus  Drips: None.      - Pulm: Trach collar FiO2 (%):  [28 %] 28 %  ABG    Component Value Date/Time   PHART 7.481 (H) 09/07/2019 0029   PCO2ART 31.4 (L) 09/07/2019 0029   PO2ART 125 (H) 09/07/2019 0029   HCO3 23.3 09/07/2019 0029   TCO2 24 09/07/2019 0029   ACIDBASEDEF 5.0 (H) 08/18/2019 2341   O2SAT 99.0 09/07/2019 0029    - Abd: Soft - Extremity: Warm  .Intake/Output      05/29 0701 - 05/30 0700 05/30 0701 - 05/31 0700   Other 0    NG/GT 990    Total Intake(mL/kg) 990 (11.5)    Urine (mL/kg/hr) 0 (0)    Emesis/NG output 0    Drains     Other 0    Stool 0    Total Output 0    Net +990         Stool Occurrence 1 x       _______________________________________________________________ Labs: CBC Latest Ref Rng & Units 09/24/2019 09/23/2019 09/22/2019  WBC 4.0 - 10.5 K/uL 14.9(H) 15.1(H) 14.2(H)  Hemoglobin 13.0 - 17.0 g/dL 9.1(L) 9.2(L) 8.8(L)  Hematocrit 39.0 - 52.0 % 28.9(L) 28.9(L) 28.1(L)  Platelets 150 - 400 K/uL 354 364 357   CMP Latest Ref Rng & Units 09/24/2019 09/23/2019 09/22/2019  Glucose 70 - 99 mg/dL 159(H) 146(H) 153(H)  BUN 8 - 23 mg/dL 157(H) 118(H) 72(H)  Creatinine 0.61 - 1.24 mg/dL 7.00(H) 5.92(H) 4.34(H)  Sodium 135 - 145 mmol/L 133(L) 134(L) 135  Potassium 3.5 - 5.1 mmol/L 4.3 3.9 4.2  Chloride 98 - 111 mmol/L 91(L) 94(L) 97(L)  CO2 22 - 32 mmol/L 25 25 25   Calcium 8.9 - 10.3 mg/dL 9.0 9.0 8.9  Total Protein 6.5 - 8.1 g/dL - - -  Total Bilirubin 0.3 - 1.2 mg/dL - - -  Alkaline Phos 38 -  126 U/L - - -  AST 15 - 41 U/L - - -  ALT 0 - 44 U/L - - -    CXR: Stable  _______________________________________________________________  Assessment and Plan: Status post CABG, cardiac arrest due to tamponade and open chest.  Stress has since been closed. Tolerating trach collar Awaiting placement to LTAC facility.  Melodie Bouillon, MD 09/24/2019 11:25 AM

## 2019-09-24 NOTE — Progress Notes (Addendum)
[facility] Progress Note     Assessment/ Plan:    1. AKI- (crt 1.22 on 07/24/19) following cardiac cath, cabg, cardiogenic shock, cardiac arrest due to tamponade, and acute on chronic CHF. Started CRRT on 08/24/19-5/12 due to extensive volume overload preventing chest closure.  1. Missed dialysis yesterday due to scheduling conflict.  Dialysis today and then continue TTS for now 2. S/p RIJ TDC 5/25 IR.  3. Possible renal recovery earlier this week when bedsheets were found left.  Monitoring bladder scans.  No documented urine output over the past 24 hours 2. CAD s/p CABG x 4 complicated by cardiac tamponade s/p reopened chest urgently on 08/18/19 with wound vac.most recent OR On 5/12and then 5/20 (evacuation of blood in chest) 3. Acute systolic heart failure due to ischemic cardiomyopathy- Working towards euvolemia with UF on HD.  4. Postoperative acute hypoxic respiratory failure on h/o COPD- reintubated 08/18/19 and likely aspiration. Now improved to  trach collar 5. Anemia- Hgbstable in 8s > 9. transfuse prn.On ESA- darbe 150 6. A fib per primary 7. Dispo - LTACH planned, discussion for PEG underway.   Subjective:   Patient stable without any complaints today.   Objective:   BP 117/65   Pulse 98   Temp 98.4 F (36.9 C) (Oral)   Resp 17   Ht 5\' 9"  (1.753 m)   Wt 86.4 kg   SpO2 98%   BMI 28.13 kg/m   Intake/Output Summary (Last 24 hours) at 09/24/2019 1234 Last data filed at 09/24/2019 0700 Gross per 24 hour  Intake 765 ml  Output 0 ml  Net 765 ml   Weight change: 0.1 kg  Physical Exam: Gen: In bed, no distress CVS: normal rate Resp: coarse BS anteriorly, on TC Ext: 1-2+ pitting edema in all 4 extremities GU: no foley Access: [facility]   Imaging: DG Chest Port 1 View  Result Date: 09/23/2019 CLINICAL DATA:  History of recent mediastinal expiration EXAM: PORTABLE CHEST 1 VIEW COMPARISON:  09/18/2019 FINDINGS: Postsurgical changes are again seen and  stable. New left jugular dialysis catheter is noted with the tip in the right atrium. Tracheostomy tube and feeding catheter are again seen. Slight increased density is noted in the left hemithorax likely related to posterior effusion. No focal infiltrate is noted. No pneumothorax is seen. IMPRESSION: Left-sided pleural effusion. New left jugular dialysis catheter. Remainder of the exam is stable. Electronically Signed   By: Inez Catalina M.D.   On: 09/23/2019 09:22    Labs: BMET Recent Labs  Lab 09/18/19 0336 09/19/19 0312 09/20/19 0333 09/21/19 0658 09/22/19 0224 09/23/19 0238 09/24/19 0234  NA 139 136 136 136 135 134* 133*  K 4.0 4.9 4.6 4.8 4.2 3.9 4.3  CL 97* 97* 98 96* 97* 94* 91*  CO2 28 25 26 25 25 25 25   GLUCOSE 62* 132* 173* 132* 153* 146* 159*  BUN 76* 114* 69* 103* 72* 118* 157*  CREATININE 4.98* 6.52* 4.93* 6.49* 4.34* 5.92* 7.00*  CALCIUM 8.6* 8.8* 8.7* 8.9 8.9 9.0 9.0  PHOS 5.3* 6.9* 5.5* 6.3* 4.5 5.3* 6.0*   CBC Recent Labs  Lab 09/21/19 0658 09/22/19 0224 09/23/19 0238 09/24/19 0234  WBC 12.9* 14.2* 15.1* 14.9*  HGB 9.3* 8.8* 9.2* 9.1*  HCT 29.6* 28.1* 28.9* 28.9*  MCV 98.7 99.6 99.7 98.6  PLT 403* 357 364 354    Medications:    .  stroke: mapping our early stages of recovery book   Does not apply Once  .  alteplase  2 mg Intracatheter Once  . amiodarone  200 mg Per Tube BID  . arformoterol  15 mcg Nebulization BID  . aspirin  81 mg Per Tube Daily  . atorvastatin  40 mg Per Tube Daily  . bisacodyl  10 mg Rectal Daily  . chlorhexidine  15 mL Mouth Rinse BID  . Chlorhexidine Gluconate Cloth  6 each Topical Q0600  . clonazePAM  0.5 mg Per Tube BID  . darbepoetin (ARANESP) injection - NON-DIALYSIS  150 mcg Subcutaneous Q Mon-1800  . feeding supplement (PRO-STAT SUGAR FREE 64)  60 mL Per Tube TID  . insulin aspart  0-15 Units Subcutaneous Q4H  . insulin detemir  10 Units Subcutaneous BID  . mouth rinse  15 mL Mouth Rinse q12n4p  . midodrine  5 mg Per  Tube TID WC  . multivitamin  1 tablet Oral QHS  . oxyCODONE  5 mg Per Tube Q6H  . pantoprazole sodium  40 mg Per Tube Daily  . revefenacin  175 mcg Nebulization Daily  . sertraline  25 mg Per Tube Daily  . sevelamer carbonate  0.8 g Per Tube TID WC  . trospium  20 mg Per Tube BID

## 2019-09-25 LAB — GLUCOSE, CAPILLARY
Glucose-Capillary: 134 mg/dL — ABNORMAL HIGH (ref 70–99)
Glucose-Capillary: 148 mg/dL — ABNORMAL HIGH (ref 70–99)
Glucose-Capillary: 218 mg/dL — ABNORMAL HIGH (ref 70–99)
Glucose-Capillary: 140 mg/dL — ABNORMAL HIGH (ref 70–99)
Glucose-Capillary: 181 mg/dL — ABNORMAL HIGH (ref 70–99)

## 2019-09-25 LAB — RENAL FUNCTION PANEL
Albumin: 2.5 g/dL — ABNORMAL LOW (ref 3.5–5.0)
Anion gap: 14 (ref 5–15)
BUN: 90 mg/dL — ABNORMAL HIGH (ref 8–23)
CO2: 26 mmol/L (ref 22–32)
Calcium: 9 mg/dL (ref 8.9–10.3)
Chloride: 95 mmol/L — ABNORMAL LOW (ref 98–111)
Creatinine, Ser: 4.81 mg/dL — ABNORMAL HIGH (ref 0.61–1.24)
GFR calc Af Amer: 13 mL/min — ABNORMAL LOW (ref >60)
GFR calc non Af Amer: 11 mL/min — ABNORMAL LOW (ref >60)
Glucose, Bld: 149 mg/dL — ABNORMAL HIGH (ref 70–99)
Phosphorus: 4.5 mg/dL (ref 2.5–4.6)
Potassium: 3.7 mmol/L (ref 3.5–5.1)
Sodium: 135 mmol/L (ref 135–145)

## 2019-09-25 LAB — CBC
HCT: 27.2 % — ABNORMAL LOW (ref 39.0–52.0)
Hemoglobin: 8.7 g/dL — ABNORMAL LOW (ref 13.0–17.0)
MCH: 31.4 pg (ref 26.0–34.0)
MCHC: 32 g/dL (ref 30.0–36.0)
MCV: 98.2 fL (ref 80.0–100.0)
Platelets: 300 10*3/uL (ref 150–400)
RBC: 2.77 MIL/uL — ABNORMAL LOW (ref 4.22–5.81)
RDW: 18.4 % — ABNORMAL HIGH (ref 11.5–15.5)
WBC: 15.7 10*3/uL — ABNORMAL HIGH (ref 4.0–10.5)
nRBC: 0 % (ref 0.0–0.2)

## 2019-09-25 LAB — MAGNESIUM: Magnesium: 2.6 mg/dL — ABNORMAL HIGH (ref 1.7–2.4)

## 2019-09-25 NOTE — Progress Notes (Signed)
  Speech Language Pathology Treatment: Christian [name] Speaking valve  Patient Details Name: Christian [name] MRN: [id number] DOB: 02/03/1946 Today's Date: 09/25/2019 Time: 1011-1026 SLP Time Calculation (min) (ACUTE ONLY): 15 min  Assessment / Plan / Recommendation Clinical Impression  Pt's trach has been downsized to #4 cuffless.  PMV placed - tolerated valve with stable VS (HR103, RR 17, Sp02 100%) for 15 minute usage.  Pt with flat affect, limited spontaneity of communication.  Continues to require consistent cues to phonate rather than defaulting to mouthing words or gesturing. With cues, voice is hypophonic; quality sounds improved from last week.  Wife present; she was shown how to place and remove valve.  Recommend pt use valve all waking hours with intermittent supervision.  Christian [name] will benefit from a swallowing assessment - will request orders.  Discussed with RN.    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 4/23 cardiac arrest, 4/29 initiated CRRT, vent s/p trach. 5/10 additional sternal washout unable to close.  5/12 sternal plating with pectoralis flap and omental harvest to mediastinum off CRRT with iHD started. Chest wound closed. Neurology consulted for left sided weakness- CTH neg 09/08/19. 5/20 pt with emergent hematoma evacuation and transition to trach collar; 5/28 downsized to #4 cuffless. PMHx: BPH, CAD, DM, HLD, HTN      SLP Plan  Continue with current plan of care       Recommendations         Patient may use Passy-Muir Speech Valve: During all waking hours (remove during sleep) PMSV Supervision: Intermittent         Oral Care Recommendations: Oral care QID SLP Visit Diagnosis: Aphonia (R49.1) Plan: Continue with current plan of care       GO              Christian [name] L. Tivis Ringer, [place] CCC/SLP Acute Rehabilitation Services Office number [phone] Pager [phone]   Christian [name] Christian [name] 09/25/2019, 10:29 AM

## 2019-09-25 NOTE — Plan of Care (Signed)
  Problem: Clinical Measurements: Goal: Will remain free from infection Outcome: Progressing Goal: Diagnostic test results will improve Outcome: Progressing Goal: Respiratory complications will improve Outcome: Progressing Goal: Cardiovascular complication will be avoided Outcome: Progressing   Problem: Elimination: Goal: Will not experience complications related to urinary retention Outcome: Progressing   Problem: Safety: Goal: Ability to remain free from injury will improve Outcome: Adequate for Discharge   Problem: Cardiac: Goal: Will achieve and/or maintain hemodynamic stability Outcome: Adequate for Discharge   Problem: Urinary Elimination: Goal: Ability to achieve and maintain adequate renal perfusion and functioning will improve Outcome: Adequate for Discharge

## 2019-09-25 NOTE — Progress Notes (Signed)
**Christian Christian** Christian Christian  Assessment/ Plan: Pt is a 74 y.o. yo male is status post CABG and had a cardiac arrest due to tamponade, developed AKI requiring dialysis.  Previously the creatinine level was 1.22 in 06/2019.  #Acute kidney injury following cardiac cath, CABG, cardiogenic shock and cardiac arrest.  Started CRRT from 4/29-5/12 to manage volume.  He is now tolerating intermittent hemodialysis.  Status post HD yesterday with around 2 L UF.  Continue to monitor for renal recovery.  Daily assessment for HD need.  For now continue TTS schedule.  Status post right IJ TDC by IR on 5/25.  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  # Postoperative acute hypoxic respiratory failure on h/o COPD- reintubated 08/18/19 and likely aspiration. Now improved to  trach collar.  # Anemia- Hgbstable in 8s > 9. transfuse prn.On ESA- darbe 150.  # A fib per primary.  #Dispo - LTACH planned, discussion for PEG underway.   Subjective: Seen and examined at bedside.  He is on trach collar.  No urine output.  Had dialysis on 5/30 with around 2 L UF.  No chest pain or shortness of breath.  No new event. Objective Vital signs in last 24 hours: Vitals:   09/25/19 0800 09/25/19 0806 09/25/19 0809 09/25/19 0811  BP:    130/67  Pulse: 97   100  Resp: (!) 29   12  Temp:      TempSrc:      SpO2: 100% 100% 100% 100%  Weight:      Height:       Weight change: -0.2 kg  Intake/Output Summary (Last 24 hours) at 09/25/2019 0905 Last data filed at 09/25/2019 0800 Gross per 24 hour  Intake 1215 ml  Output 2095 ml  Net -880 ml       Labs: Basic Metabolic Panel: Recent Labs  Lab 09/23/19 0238 09/24/19 0234 09/25/19 0229  NA 134* 133* 135  K 3.9 4.3  3.7  CL 94* 91* 95*  CO2 25 25 26   GLUCOSE 146* 159* 149*  BUN 118* 157* 90*  CREATININE 5.92* 7.00* 4.81*  CALCIUM 9.0 9.0 9.0  PHOS 5.3* 6.0* 4.5   Liver Function Tests: Recent Labs  Lab 09/23/19 0238 09/24/19 0234 09/25/19 0229  ALBUMIN 2.6* 2.6* 2.5*   No results for input(s): LIPASE, AMYLASE in the last 168 hours. No results for input(s): AMMONIA in the last 168 hours. CBC: Recent Labs  Lab 09/21/19 0658 09/21/19 0658 09/22/19 0224 09/22/19 0224 09/23/19 0238 09/24/19 0234 09/25/19 0229  WBC 12.9*   < > 14.2*   < > 15.1* 14.9* 15.7*  HGB 9.3*   < > 8.8*   < > 9.2* 9.1* 8.7*  HCT 29.6*   < > 28.1*   < > 28.9* 28.9* 27.2*  MCV 98.7  --  99.6  --  99.7 98.6 98.2  PLT 403*   < > 357   < > 364 354 300   < > = values in this interval not displayed.   Cardiac Enzymes: No results for input(s): CKTOTAL, CKMB, CKMBINDEX, TROPONINI in the last 168 hours. CBG: Recent Labs  Lab 09/24/19 1517 09/24/19 1944 09/24/19 2335 09/25/19 0428 09/25/19 0729  GLUCAP 138* 202* 198* 134* 148*    Iron  Studies: No results for input(s): IRON, TIBC, TRANSFERRIN, FERRITIN in the last 72 hours. Studies/Results: No results found.  Medications: Infusions: . sodium chloride Stopped (09/14/19 0802)  . sodium chloride    . sodium chloride    . feeding supplement (NEPRO CARB STEADY) 45 mL/hr at 09/24/19 1800  . lactated ringers 20 mL/hr at 08/20/19 1200    Scheduled Medications: .  stroke: mapping our early stages of recovery book   Does not apply Once  . alteplase  2 mg Intracatheter Once  . amiodarone  200 mg Per Tube BID  . arformoterol  15 mcg Nebulization BID  . aspirin  81 mg Per Tube Daily  . atorvastatin  40 mg Per Tube Daily  . bisacodyl  10 mg Rectal Daily  . chlorhexidine  15 mL Mouth Rinse BID  . Chlorhexidine Gluconate Cloth  6 each Topical Q0600  . clonazePAM  0.5 mg Per Tube BID  . darbepoetin (ARANESP) injection - NON-DIALYSIS  150 mcg Subcutaneous Q Mon-1800   . feeding supplement (PRO-STAT SUGAR FREE 64)  60 mL Per Tube TID  . insulin aspart  0-15 Units Subcutaneous Q4H  . insulin detemir  10 Units Subcutaneous BID  . mouth rinse  15 mL Mouth Rinse q12n4p  . midodrine  5 mg Per Tube TID WC  . multivitamin  1 tablet Oral QHS  . oxyCODONE  5 mg Per Tube Q6H  . pantoprazole sodium  40 mg Per Tube Daily  . revefenacin  175 mcg Nebulization Daily  . sertraline  25 mg Per Tube Daily  . sevelamer carbonate  0.8 g Per Tube TID WC  . trospium  20 mg Per Tube BID    have reviewed scheduled and prn medications.  Physical Exam: General:NAD, lying on bed, has tracheostomy Heart:RRR, s1s2 nl Lungs: Bibasal rhonchi, no wheezing Abdomen:soft, Non-tender, non-distended Extremities:No edema Dialysis Access: Right IJ TDC.  [name] [name] 09/25/2019,9:05 AM  LOS: 47 days  Pager: [phone]

## 2019-09-25 NOTE — Progress Notes (Signed)
Patient ID: Christian [name], male   DOB: December 19, 1945, 74 y.o.   MRN: [id number] EVENING ROUNDS NOTE :     [address]       [place] [address]             [phone]                 11 Days Post-Op Procedure(s) (LRB): EVACUATION OF HEMATOMA (N/A)  Total Length of Stay:  LOS: 47 days  BP 119/73   Pulse 92   Temp 97.9 F (36.6 C) (Oral)   Resp 14   Ht 5\' 9"  (1.753 m)   Wt 85.3 kg   SpO2 100%   BMI 27.77 kg/m   .Intake/Output      05/31 0701 - 06/01 0700   Other 30   NG/GT 1100   Total Intake(mL/kg) 1130 (13.2)   Emesis/NG output    Other    Total Output    Net +1130       Urine Occurrence 2 x   Stool Occurrence 2 x     . sodium chloride Stopped (09/14/19 0802)  . sodium chloride    . sodium chloride    . feeding supplement (NEPRO CARB STEADY) 1,000 mL (09/25/19 1655)  . lactated ringers 20 mL/hr at 08/20/19 1200     Lab Results  Component Value Date   WBC 15.7 (H) 09/25/2019   HGB 8.7 (L) 09/25/2019   HCT 27.2 (L) 09/25/2019   PLT 300 09/25/2019   GLUCOSE 149 (H) 09/25/2019   CHOL 107 08/11/2019   TRIG 71 08/27/2019   HDL 42 08/11/2019   LDLDIRECT 40.0 11/23/2018   LDLCALC 40 08/11/2019   ALT 127 (H) 09/07/2019   AST 72 (H) 09/07/2019   NA 135 09/25/2019   K 3.7 09/25/2019   CL 95 (L) 09/25/2019   CREATININE 4.81 (H) 09/25/2019   BUN 90 (H) 09/25/2019   CO2 26 09/25/2019   TSH 2.590 04/19/2019   INR 1.2 09/14/2019   HGBA1C 7.7 (H) 08/14/2019   Drains out Wound ok Waiting for LTAC   Grace Dyon MD  Beeper [phone] Office [phone] 09/25/2019 7:49 PM

## 2019-09-26 LAB — CBC
HCT: 27.1 % — ABNORMAL LOW (ref 39.0–52.0)
Hemoglobin: 8.7 g/dL — ABNORMAL LOW (ref 13.0–17.0)
MCH: 31.6 pg (ref 26.0–34.0)
MCHC: 32.1 g/dL (ref 30.0–36.0)
MCV: 98.5 fL (ref 80.0–100.0)
Platelets: 338 10*3/uL (ref 150–400)
RBC: 2.75 MIL/uL — ABNORMAL LOW (ref 4.22–5.81)
RDW: 18 % — ABNORMAL HIGH (ref 11.5–15.5)
WBC: 18.5 10*3/uL — ABNORMAL HIGH (ref 4.0–10.5)
nRBC: 0 % (ref 0.0–0.2)

## 2019-09-26 LAB — GLUCOSE, CAPILLARY
Glucose-Capillary: 157 mg/dL — ABNORMAL HIGH (ref 70–99)
Glucose-Capillary: 158 mg/dL — ABNORMAL HIGH (ref 70–99)
Glucose-Capillary: 165 mg/dL — ABNORMAL HIGH (ref 70–99)
Glucose-Capillary: 169 mg/dL — ABNORMAL HIGH (ref 70–99)
Glucose-Capillary: 195 mg/dL — ABNORMAL HIGH (ref 70–99)
Glucose-Capillary: 210 mg/dL — ABNORMAL HIGH (ref 70–99)
Glucose-Capillary: 245 mg/dL — ABNORMAL HIGH (ref 70–99)

## 2019-09-26 LAB — RENAL FUNCTION PANEL
Albumin: 2.5 g/dL — ABNORMAL LOW (ref 3.5–5.0)
Anion gap: 16 — ABNORMAL HIGH (ref 5–15)
BUN: 142 mg/dL — ABNORMAL HIGH (ref 8–23)
CO2: 24 mmol/L (ref 22–32)
Calcium: 9.1 mg/dL (ref 8.9–10.3)
Chloride: 93 mmol/L — ABNORMAL LOW (ref 98–111)
Creatinine, Ser: 6.55 mg/dL — ABNORMAL HIGH (ref 0.61–1.24)
GFR calc Af Amer: 9 mL/min — ABNORMAL LOW (ref >60)
GFR calc non Af Amer: 8 mL/min — ABNORMAL LOW (ref >60)
Glucose, Bld: 161 mg/dL — ABNORMAL HIGH (ref 70–99)
Phosphorus: 6.1 mg/dL — ABNORMAL HIGH (ref 2.5–4.6)
Potassium: 4 mmol/L (ref 3.5–5.1)
Sodium: 133 mmol/L — ABNORMAL LOW (ref 135–145)

## 2019-09-26 LAB — IRON AND TIBC
Iron: 36 ug/dL — ABNORMAL LOW (ref 45–182)
Saturation Ratios: 14 % — ABNORMAL LOW (ref 17.9–39.5)
TIBC: 260 ug/dL (ref 250–450)
UIBC: 224 ug/dL

## 2019-09-26 LAB — MAGNESIUM: Magnesium: 2.9 mg/dL — ABNORMAL HIGH (ref 1.7–2.4)

## 2019-09-26 LAB — FERRITIN: Ferritin: 511 ng/mL — ABNORMAL HIGH (ref 24–336)

## 2019-09-26 MED ORDER — LIDOCAINE-PRILOCAINE 2.5-2.5 % EX CREA
1.0000 "application " | TOPICAL_CREAM | CUTANEOUS | Status: DC | PRN
  Filled 2019-09-26: qty 5

## 2019-09-26 MED ORDER — LIDOCAINE HCL (PF) 1 % IJ SOLN: 5.0000 mL | INTRAMUSCULAR | Status: DC | PRN

## 2019-09-26 MED ORDER — SODIUM CHLORIDE 0.9 % IV SOLN: 100.0000 mL | INTRAVENOUS | Status: DC | PRN

## 2019-09-26 MED ORDER — HEPARIN SODIUM (PORCINE) 1000 UNIT/ML IJ SOLN
INTRAMUSCULAR | Status: AC
  Filled 2019-09-26: qty 4

## 2019-09-26 MED ORDER — PENTAFLUOROPROP-TETRAFLUOROETH EX AERO
1.0000 "application " | INHALATION_SPRAY | CUTANEOUS | Status: DC | PRN
  Filled 2019-09-26: qty 116

## 2019-09-26 MED ORDER — SODIUM CHLORIDE 0.9 % IV SOLN
125.0000 mg | INTRAVENOUS | Status: AC
  Administered 2019-09-26 – 2019-10-07 (×4): 125 mg via INTRAVENOUS
  Filled 2019-09-26 (×8): qty 10

## 2019-09-26 MED ORDER — HEPARIN SODIUM (PORCINE) 1000 UNIT/ML DIALYSIS
20.0000 [IU]/kg | INTRAMUSCULAR | Status: DC | PRN
  Filled 2019-09-26: qty 2

## 2019-09-26 MED ORDER — SENNOSIDES 8.8 MG/5ML PO SYRP
10.0000 mL | ORAL_SOLUTION | Freq: Every day | ORAL | Status: DC | PRN
  Filled 2019-09-26: qty 10

## 2019-09-26 MED ORDER — ALTEPLASE 2 MG IJ SOLR
2.0000 mg | Freq: Once | INTRAMUSCULAR | Status: DC | PRN
  Filled 2019-09-26: qty 2

## 2019-09-26 MED ORDER — CHLORHEXIDINE GLUCONATE CLOTH 2 % EX PADS
6.0000 | MEDICATED_PAD | Freq: Every day | CUTANEOUS | Status: DC
  Administered 2019-09-28: 6 via TOPICAL

## 2019-09-26 MED ORDER — HEPARIN SODIUM (PORCINE) 1000 UNIT/ML DIALYSIS
1000.0000 [IU] | INTRAMUSCULAR | Status: DC | PRN
  Filled 2019-09-26: qty 1

## 2019-09-26 NOTE — Progress Notes (Signed)
RT given orders to cap trach. Patients trach was capped and inner cannula cleaned and placed back in head of bed bag. Patient tolerating well on room air. Nasal cannula available if needed. RT will continue to monitor. RN made aware.

## 2019-09-26 NOTE — Progress Notes (Addendum)
[facility] NEPHROLOGY PROGRESS NOTE  Assessment/ Plan: Pt is a 74 y.o. yo male is status post CABG and had a cardiac arrest due to tamponade, developed AKI requiring dialysis.  Previously the creatinine level was 1.22 in 06/2019.  #Acute kidney injury following cardiac cath, CABG, cardiogenic shock and cardiac arrest.  Started CRRT from 4/29-5/12 to manage volume.  He is now tolerating intermittent hemodialysis.   Some urine output however BUN and creatinine level worsened.  Fluid overload on exam.  Plan for HD today and we will continue TTS schedule. He will probably need arrangement for OP HD for AKI. Status post right IJ TDC by IR on 5/25.  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  # Postoperative acute hypoxic respiratory failure on h/o COPD- reintubated 08/18/19 and likely aspiration. Now improved to  trach collar.  # Anemia of critical illness: Iron saturation 14% therefore starting IV iron with dialysis.  Continue ESA.  Monitor hemoglobin.  #CKD-MBD: Continue Renvela.  Monitor Phos level.  # A fib per primary.  #Dispo - LTACH planned, discussion for PEG underway.   Subjective: Seen and examined at bedside.  No new event.  Urine output of 750 cc.  Moved to chair today.  Has trach collar.  Denies chest pain, shortness of breath.  ROS limited. Objective Vital signs in last 24 hours: Vitals:   09/26/19 0500 09/26/19 0600 09/26/19 0700 09/26/19 0756  BP: 126/60 136/67 137/64   Pulse: 100 (!) 102 (!) 102 (!) 102  Resp: 17 (!) 32 16 18  Temp:      TempSrc:      SpO2: 97% 97% 98% 100%  Weight:      Height:       Weight change: 0.3 kg  Intake/Output Summary (Last 24 hours) at 09/26/2019 0846 Last data filed at 09/26/2019 0700 Gross per 24  hour  Intake 1475 ml  Output 750 ml  Net 725 ml       Labs: Basic Metabolic Panel: Recent Labs  Lab 09/24/19 0234 09/25/19 0229 09/26/19 0345  NA 133* 135 133*  K 4.3 3.7 4.0  CL 91* 95* 93*  CO2 25 26 24   GLUCOSE 159* 149* 161*  BUN 157* 90* 142*  CREATININE 7.00* 4.81* 6.55*  CALCIUM 9.0 9.0 9.1  PHOS 6.0* 4.5 6.1*   Liver Function Tests: Recent Labs  Lab 09/24/19 0234 09/25/19 0229 09/26/19 0345  ALBUMIN 2.6* 2.5* 2.5*   No results for input(s): LIPASE, AMYLASE in the last 168 hours. No results for input(s): AMMONIA in the last 168 hours. CBC: Recent Labs  Lab 09/21/19 0658 09/21/19 0658 09/22/19 0224 09/22/19 0224 09/23/19 0238 09/24/19 0234 09/25/19 0229  WBC 12.9*   < > 14.2*   < > 15.1* 14.9* 15.7*  HGB 9.3*   < > 8.8*   < > 9.2* 9.1* 8.7*  HCT 29.6*   < > 28.1*   < > 28.9* 28.9* 27.2*  MCV 98.7  --  99.6  --  99.7 98.6 98.2  PLT 403*   < > 357   < > 364 354 300   < > = values in this interval not displayed.   Cardiac Enzymes: No results for input(s): CKTOTAL, CKMB, CKMBINDEX, TROPONINI in the last 168 hours. CBG: Recent Labs  Lab 09/25/19 1153 09/25/19 1538 09/25/19 1929 09/26/19 0027 09/26/19 0332  GLUCAP 218* 140* 181* 210* 169*    Iron Studies:  Recent Labs    09/26/19 0345  IRON 36*  TIBC 260  FERRITIN 511*   Studies/Results: No results found.  Medications: Infusions: . sodium chloride Stopped (09/14/19 0802)  . sodium chloride    . sodium chloride    . feeding supplement (NEPRO CARB STEADY) 1,000 mL (09/25/19 1655)  . lactated ringers 20 mL/hr at 08/20/19 1200    Scheduled Medications: .  stroke: mapping our early stages of recovery book   Does not apply Once  . alteplase  2 mg Intracatheter Once  . amiodarone  200 mg Per Tube BID  . arformoterol  15 mcg Nebulization BID  . aspirin  81 mg Per Tube Daily  . atorvastatin  40 mg Per Tube Daily  . bisacodyl  10 mg Rectal Daily  . chlorhexidine  15 mL Mouth Rinse  BID  . Chlorhexidine Gluconate Cloth  6 each Topical Q0600  . Chlorhexidine Gluconate Cloth  6 each Topical Q0600  . clonazePAM  0.5 mg Per Tube BID  . darbepoetin (ARANESP) injection - NON-DIALYSIS  150 mcg Subcutaneous Q Mon-1800  . feeding supplement (PRO-STAT SUGAR FREE 64)  60 mL Per Tube TID  . insulin aspart  0-15 Units Subcutaneous Q4H  . insulin detemir  10 Units Subcutaneous BID  . mouth rinse  15 mL Mouth Rinse q12n4p  . midodrine  5 mg Per Tube TID WC  . multivitamin  1 tablet Oral QHS  . oxyCODONE  5 mg Per Tube Q6H  . pantoprazole sodium  40 mg Per Tube Daily  . revefenacin  175 mcg Nebulization Daily  . sertraline  25 mg Per Tube Daily  . sevelamer carbonate  0.8 g Per Tube TID WC  . trospium  20 mg Per Tube BID    have reviewed scheduled and prn medications.  Physical Exam: General:NAD, sitting on chair comfortable has tracheostomy Heart:RRR, s1s2 nl Lungs: Bibasal rhonchi, no wheezing Abdomen:soft, Non-tender, non-distended Extremities:No edema Dialysis Access: Right IJ TDC.  Venie Montesinos Prasad Stefannie Defeo 09/26/2019,8:46 AM  LOS: 48 days  Pager: [phone]

## 2019-09-26 NOTE — TOC Initial Note (Signed)
Transition of Care [facility]) - Initial/Assessment Note    Patient Details  Name: COURTENAY CREGER MRN: [id number] Date of Birth: 01-26-1946  Transition of Care [facility]) CM/SW Contact:    Trula Ore, [place] Phone Number: 09/26/2019, 12:40 PM  Clinical Narrative:                  CSW spoke with patients spouse Pamala Hurry at bedside who is agreeable to SNF placement for patient. Patients spouse gave CSW permission to fax out initial referral to [facility] area for possible SNF placement. CSW will start insurance authorization closer to patient being ready for discharge.  SNF bed offers pending.  Expected Discharge Plan: Skilled Nursing Facility Barriers to Discharge: Continued Medical Work up   Patient Goals and CMS Choice   CMS Medicare.gov Compare Post Acute Care list provided to:: Patient Represenative (must comment)(Spouse Pamala Hurry) Choice offered to / list presented to : Spouse(Barbara)  Expected Discharge Plan and Services Expected Discharge Plan: [place]       Living arrangements for the past 2 months: Single Family Home                                      Prior Living Arrangements/Services Living arrangements for the past 2 months: Single Family Home Lives with:: Self, Spouse Patient language and need for interpreter reviewed:: Yes Do you feel safe going back to the place where you live?: No   SNF  Need for Family Participation in Patient Care: Yes (Comment) Care giver support system in place?: Yes (comment)   Criminal Activity/Legal Involvement Pertinent to Current Situation/Hospitalization: No - Comment as needed  Activities of Daily Living Home Assistive Devices/Equipment: None ADL Screening (condition at time of admission) Patient's cognitive ability adequate to safely complete daily activities?: Yes Is the patient deaf or have difficulty hearing?: No Does the patient have difficulty seeing, even when wearing glasses/contacts?: No Does  the patient have difficulty concentrating, remembering, or making decisions?: No Patient able to express need for assistance with ADLs?: Yes Does the patient have difficulty dressing or bathing?: No Independently performs ADLs?: Yes (appropriate for developmental age) Does the patient have difficulty walking or climbing stairs?: Yes Weakness of Legs: None Weakness of Arms/Hands: None  Permission Sought/Granted Permission sought to share information with : Case Manager, Customer service manager, Family Supports                Emotional Assessment Appearance:: Appears stated age     Orientation: : Oriented to Self, Oriented to Place, Oriented to  Time Alcohol / Substance Use: Not Applicable Psych Involvement: No (comment)  Admission diagnosis:  NSTEMI (non-ST elevated myocardial infarction) ([place]) [I21.4] Non-ST elevation (NSTEMI) myocardial infarction ([place]) [I21.4] Other acute pulmonary embolism without acute cor pulmonale ([place]) [I26.99] Coronary artery disease [I25.10] Patient Active Problem List   Diagnosis Date Noted  . Acute hypoxemic respiratory failure ([place])   . Pressure injury of skin 08/28/2019  . Coronary artery disease 08/15/2019  . 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  LIMA TO LAD, RIMA TO PDA, RADIAL ARTERY TO CIRC AND SEQUENTIALLY TO OM1. 08/15/2019  . Abdominal pain   . Ischemic cardiomyopathy   . Swelling of lower extremity   . History of coronary artery stent placement   . Elevated troponin I level 08/09/2019  . Pulmonary embolism ([place]) 08/09/2019  . AKI (acute  kidney injury) ([place]) 08/09/2019  . Macrocytosis 08/09/2019  . Elevated liver enzymes 08/09/2019  . Non-ST elevation (NSTEMI) myocardial infarction ([place]) 08/09/2019  . Type 2 diabetes mellitus without complication, without long-term current use of insulin ([place]) 07/24/2019  . Obesity (BMI 30-39.9) 12/02/2018  . Left leg weakness 11/23/2018  . CAD (coronary  artery disease) 07/30/2018  . Fatigue 10/19/2017  . Routine general medical examination at a health care facility 08/30/2016  . Diabetes mellitus with polyneuropathy ([place]) 10/10/2014  . Benign prostatic hyperplasia 09/27/2014  . Stable angina pectoris ([place]) 02/27/2014  . S/P PTCA (percutaneous transluminal coronary angioplasty) 02/27/2014  . Other chest pain 02/25/2014  . HLD (hyperlipidemia) 11/12/2009  . MULTIPLE SCLEROSIS 11/12/2009  . HTN (hypertension) 11/12/2009   PCP:  Hoyt Koch, MD Pharmacy:   [id number] [place], [place] [address] [place] [address] Phone: [phone] Fax: [phone]     Social Determinants of Health (SDOH) Interventions    Readmission Risk Interventions No flowsheet data found.

## 2019-09-26 NOTE — NC FL2 (Signed)
[place] LEVEL OF CARE SCREENING TOOL     IDENTIFICATION  Patient Name: Christian [name] Birthdate: 1945/09/27 Sex: male Admission Date (Current Location): 08/09/2019  [facility] [place] Number:  Herbalist and Address:  [facility], [place] [address], [place], [place] [address]      Provider Number: [id number]  Attending Physician Name and Address:  [name], MD  Relative Name and Phone Number:  Pamala Hurry [phone]    Current Level of Care: SNF Recommended Level of Care: [place] Prior Approval Number:    Date Approved/Denied: 09/26/19 PASRR Number: [id number]  Discharge Plan: SNF    Current Diagnoses: Patient Active Problem List   Diagnosis Date Noted  . Acute hypoxemic respiratory failure ([place])   . Pressure injury of skin 08/28/2019  . Coronary artery disease 08/15/2019  . 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  LIMA TO LAD, RIMA TO PDA, RADIAL ARTERY TO CIRC AND SEQUENTIALLY TO OM1. 08/15/2019  . Abdominal pain   . Ischemic cardiomyopathy   . Swelling of lower extremity   . History of coronary artery stent placement   . Elevated troponin I level 08/09/2019  . Pulmonary embolism ([place]) 08/09/2019  . AKI (acute kidney injury) ([place]) 08/09/2019  . Macrocytosis 08/09/2019  . Elevated liver enzymes 08/09/2019  . Non-ST elevation (NSTEMI) myocardial infarction ([place]) 08/09/2019  . Type 2 diabetes mellitus without complication, without long-term current use of insulin ([place]) 07/24/2019  . Obesity (BMI 30-39.9) 12/02/2018  . Left leg weakness 11/23/2018  . CAD (coronary artery disease) 07/30/2018  . Fatigue 10/19/2017  . Routine general medical examination at a health care facility 08/30/2016  . Diabetes mellitus with polyneuropathy ([place]) 10/10/2014  . Benign prostatic hyperplasia 09/27/2014  . Stable angina pectoris ([place]) 02/27/2014  . S/P PTCA (percutaneous  transluminal coronary angioplasty) 02/27/2014  . Other chest pain 02/25/2014  . HLD (hyperlipidemia) 11/12/2009  . MULTIPLE SCLEROSIS 11/12/2009  . HTN (hypertension) 11/12/2009    Orientation RESPIRATION BLADDER Height & Weight     Self, Time, Place(intubated/trach unable to assess)  Tracheostomy, O2(trach collar capped) Incontinent, External catheter(External Urinary Catheter) Weight: 196 lb 3.4 oz (89 kg) Height:  5\' 9"  (175.3 cm)  BEHAVIORAL SYMPTOMS/MOOD NEUROLOGICAL BOWEL NUTRITION STATUS      Incontinent Diet(see discharge summary)  AMBULATORY STATUS COMMUNICATION OF NEEDS Skin   Total Care (intubated/trach) Skin abrasions, Other (Comment)(surgical incision, ecchymosis arm left,weeping loc. arm left,moisture buttocks rt left mid,pressure injury buttocks rt stage 2 incision closed arm left incision closed abdomen left incision closed chest incis. closed chest left incis. closed neck left)                       Personal Care Assistance Level of Assistance  Bathing, Feeding, Dressing Bathing Assistance: Maximum assistance Feeding assistance: Maximum assistance(Tube Feeding) Dressing Assistance: Maximum assistance     Functional Limitations Info  Sight, Hearing, Speech Sight Info: Impaired Hearing Info: Impaired(intubated/trach) Speech Info: Adequate(intubated/trach)    SPECIAL CARE FACTORS FREQUENCY  PT (By licensed PT), OT (By licensed OT)     PT Frequency: 5x min weekly OT Frequency: 5x min weekly            Contractures Contractures Info: Not present    Additional Factors Info  Code Status Code Status Info: FULL             Current Medications (09/26/2019):  This  is the current hospital active medication list Current Facility-Administered Medications  Medication Dose Route Frequency Provider Last Rate Last Admin  .  stroke: mapping our early stages of recovery book   Does not apply Once Lanelle Bal B, MD      . 0.9 %  sodium chloride infusion   250 mL Intravenous Continuous Grace Tyrique, MD   Stopped at 09/14/19 0802  . 0.9 %  sodium chloride infusion  100 mL Intravenous PRN Corliss Parish, MD      . 0.9 %  sodium chloride infusion  100 mL Intravenous PRN Corliss Parish, MD      . 0.9 %  sodium chloride infusion  100 mL Intravenous PRN Rosita Fire, MD      . 0.9 %  sodium chloride infusion  100 mL Intravenous PRN Rosita Fire, MD      . acetaminophen (TYLENOL) tablet 650 mg  650 mg Oral Q6H PRN Grace Iley, MD   650 mg at 09/25/19 1234  . albuterol (PROVENTIL) (2.5 MG/3ML) 0.083% nebulizer solution 2.5 mg  2.5 mg Nebulization Q4H PRN Grace Jayron, MD      . alteplase (CATHFLO ACTIVASE) injection 2 mg  2 mg Intracatheter Once [name], MD      . alteplase (CATHFLO ACTIVASE) injection 2 mg  2 mg Intracatheter Once PRN Rosita Fire, MD      . amiodarone (PACERONE) tablet 200 mg  200 mg Per Tube BID Grace Aneesh, MD   200 mg at 09/26/19 1048  . arformoterol (BROVANA) nebulizer solution 15 mcg  15 mcg Nebulization BID Grace Mads, MD   15 mcg at 09/26/19 0750  . aspirin chewable tablet 81 mg  81 mg Per Tube Daily Grace Suraj, MD   81 mg at 09/26/19 1047  . atorvastatin (LIPITOR) tablet 40 mg  40 mg Per Tube Daily Grace Abdalla, MD   40 mg at 09/26/19 1047  . bisacodyl (DULCOLAX) suppository 10 mg  10 mg Rectal Daily Grace Destry, MD   Stopped at 09/03/19 1000  . chlorhexidine (PERIDEX) 0.12 % solution 15 mL  15 mL Mouth Rinse BID [name], MD   15 mL at 09/26/19 1046  . Chlorhexidine Gluconate Cloth 2 % PADS 6 each  6 each Topical Q0600 [name], MD   6 each at 09/26/19 1052  . Chlorhexidine Gluconate Cloth 2 % PADS 6 each  6 each Topical Q0600 Rosita Fire, MD      . clonazePAM [place]bye [place]) disintegrating tablet 0.5 mg  0.5 mg Per Tube BID [name], MD   0.5 mg at 09/26/19 1046  . Darbepoetin Alfa (ARANESP)  injection 150 mcg  150 mcg Subcutaneous Q Mon-1800 Grace Farzad, MD   150 mcg at 09/25/19 1708  . dextrose 50 % solution 0-50 mL  0-50 mL Intravenous PRN Grace Marquavius, MD   50 mL at 09/18/19 1540  . feeding supplement (NEPRO CARB STEADY) liquid 1,000 mL  1,000 mL Oral Continuous [name], MD 45 mL/hr at 09/25/19 1655 1,000 mL at 09/25/19 1655  . feeding supplement (PRO-STAT SUGAR FREE 64) liquid 60 mL  60 mL Per Tube TID Grace Siddiq, MD   60 mL at 09/26/19 1046  . ferric gluconate (NULECIT) 125 mg in sodium chloride 0.9 % 100 mL IVPB  125 mg Intravenous Q T,Th,Sa-HD Rosita Fire, MD 110 mL/hr at 09/26/19 1155 125 mg  at 09/26/19 1155  . Gerhardt's butt cream   Topical QID PRN [name], MD   1 application at 08/67/61 0800  . guaiFENesin (ROBITUSSIN) 100 MG/5ML solution 100 mg  5 mL Oral Q4H PRN [name], MD   100 mg at 09/17/19 2144  . heparin injection 1,000 Units  1,000 Units Dialysis PRN Corliss Parish, MD      . heparin injection 1,000 Units  1,000 Units Dialysis PRN Rosita Fire, MD      . heparin injection 1,700 Units  20 Units/kg Dialysis PRN Rosita Fire, MD      . heparin sodium (porcine) 1000 UNIT/ML injection           . hydrALAZINE (APRESOLINE) injection 20 mg  20 mg Intravenous Q4H PRN Grace Olden, MD   20 mg at 09/14/19 1113  . hydrocortisone cream 1 %   Topical PRN [name], MD   Given at 09/22/19 1306  . HYDROmorphone (DILAUDID) injection 1 mg  1 mg Intravenous Q2H PRN Grace Christina, MD   1 mg at 09/25/19 1235  . insulin aspart (novoLOG) injection 0-15 Units  0-15 Units Subcutaneous Q4H [name], NP   3 Units at 09/26/19 1216  . insulin detemir (LEVEMIR) injection 10 Units  10 Units Subcutaneous BID [name], NP   10 Units at 09/26/19 1048  . lactated ringers infusion   Intravenous Continuous Grace Radek, MD 20 mL/hr at 08/20/19 1200 Rate Verify at 08/20/19 1200  .  lidocaine (PF) (XYLOCAINE) 1 % injection 5 mL  5 mL Intradermal PRN Corliss Parish, MD      . lidocaine (PF) (XYLOCAINE) 1 % injection 5 mL  5 mL Intradermal PRN Rosita Fire, MD      . lidocaine-prilocaine (EMLA) cream 1 application  1 application Topical PRN Corliss Parish, MD      . lidocaine-prilocaine (EMLA) cream 1 application  1 application Topical PRN Rosita Fire, MD      . MEDLINE mouth rinse  15 mL Mouth Rinse q12n4p [name], MD   15 mL at 09/26/19 1217  . midodrine (PROAMATINE) tablet 5 mg  5 mg Per Tube TID WC Barrett, Erin R, PA-C   5 mg at 09/26/19 1216  . multivitamin (RENA-VIT) tablet 1 tablet  1 tablet Oral QHS [name], MD   1 tablet at 09/25/19 2214  . ondansetron (ZOFRAN) injection 4 mg  4 mg Intravenous Q6H PRN Grace Chantz, MD   4 mg at 09/12/19 0806  . oxyCODONE (Oxy IR/ROXICODONE) immediate release tablet 5 mg  5 mg Per Tube Q6H Candee Furbish, MD   5 mg at 09/26/19 0859  . pantoprazole sodium (PROTONIX) 40 mg/20 mL oral suspension 40 mg  40 mg Per Tube Daily Grace Jvon, MD   40 mg at 09/26/19 1046  . pentafluoroprop-tetrafluoroeth (GEBAUERS) aerosol 1 application  1 application Topical PRN Corliss Parish, MD      . pentafluoroprop-tetrafluoroeth (GEBAUERS) aerosol 1 application  1 application Topical PRN Rosita Fire, MD      . revefenacin [facility]) nebulizer solution 175 mcg  175 mcg Nebulization Daily Grace Shavon, MD   175 mcg at 09/26/19 0750  . sennosides (SENOKOT) 8.8 MG/5ML syrup 10 mL  10 mL Per Tube Daily PRN Jennelle Human B, NP      . sertraline (ZOLOFT) tablet 25 mg  25 mg Per Tube Daily Atkins, Broadus Z,  MD   25 mg at 09/26/19 1047  . sevelamer carbonate (RENVELA) packet 0.8 g  0.8 g Per Tube TID WC Atkins, Glenice Bow, MD   0.8 g at 09/26/19 1215  . sodium chloride flush (NS) 0.9 % injection 10-40 mL  10-40 mL Intracatheter PRN Grace Ekin, MD      . trospium [facility])  tablet 20 mg  20 mg Per Tube BID Grace Matthias, MD   20 mg at 09/26/19 1048     Discharge Medications: Please see discharge summary for a list of discharge medications.  Relevant Imaging Results:  Relevant Lab Results:   Additional Information [id number]  Trula Ore, LCSWA

## 2019-09-26 NOTE — Progress Notes (Signed)
NCM f/u with [facility] admissions liaison regarding potential placement. Liaison stated [facility] without HD beds and it will be a while before a HD bed presents. Advised SNF placement for pt. NCM made CSW aware.  TOC team will continue monitoring ... Whitman Hero RN,BSN,CM

## 2019-09-26 NOTE — Progress Notes (Signed)
NAME:  Christian [name], MRN:  [id number], DOB:  05/13/1945, LOS: 26 ADMISSION DATE:  08/09/2019, CONSULTATION DATE:  08/28/2019 REFERRING MD:  Orvan Seen - TRH, CHIEF COMPLAINT:  Respiratory failure.    HPI/course in hospital   74 year old man who presented with progressive dyspnea.  He was admitted 4/14 and was incidentally found to have a right lower lobe pulmonary embolism.  Overall the picture was that of decompensated heart failure with a non-STEMI.  Coronary angiography revealed triple-vessel disease and the patient was referred for cardiac surgery.  EF was found to be reduced at that time.  He underwent four-vessel CABG 4/20 with good quality distal targets.  Cardiac function remained reduced post bypass.  Unable to close chest due to surrounding edema.  Underwent attempted closure with VAC placement 4/22.   Cardiac arrest 4/23 with repeated washouts with VAC placements on 4/26 and 4/29.  Significant volume overload postoperatively.  Acute kidney injury.  Transitioned from CRRT for fluid removal to iHD on 5/13.  Sig Events: 08/09/19 Admitted with NSTEM and ? Small RLL PE. Weight 213 pounds.  08/15/19 CABG x4 on 08/15/19 08/18/19 Cardiac arrest due to tamponade. Chest opened for washout 4/26 Chest washout in OR 4/29 CVVH started 5/5 OR for chest washout, tracheostomy 5/6 Trach aspirate with pan sensitive pseudomonas (completed course of abx  5/10 repeat washout, unable to close 5/12 to OR for chest closure with muscle flap  5/13 transitioned to [facility] 5/14 Neurology consulted for left sided weakness- CTH neg 5/20 chest wall swelling > taken to OR for evacuation of hematoma and re-closure.  5/24 down sized to 6 trach 5/28 down sized to 4 cuffless trach   Micro: 4/14 SARS/ Flu A/B >> neg 4/19 MRSA PCR >> neg 4/22 BCx 2 >> neg 5/2 trach asp >> normal flora  5/2 BCx2 >> neg 5/6 trach asp >> pan sensitive pseudomonas  5/6 BCx2 >> neg 5/10 sternal wound cx >> neg 5/14 BCx 2 >> ngtd    5/14 trach asp >> normal flora   ABX: Cefepime  4/23 >> 5/6 vanc 4/20 >> 4/25; 4/29 >>5/4; 5/6 >>5/13 Fluconazole 5/7 >>5/14 Meropenem 5/7 >> 5/15  Imaging: CTA PE 4/14 >> small RLL PE Echo 4/24: EF 45% with Grade 2 DD [facility] 5/13 and 5/15 >> chronic microvascular ischemia, no acute intracranial abnormality  CT chest 5/24: BILATERAL pleural effusions and scattered atelectasis greatest in LEFT lower lobe.Pleural calcifications in hemithoraces bilaterally question asbestos exposure.Stranding of the anterior mediastinum likely related to prior cardiac surgery and sternal dehiscence, with herniation of fat through the sternotomy.Diffuse stranding along the RIGHT pectoralis major and minor muscles which could be related to prior surgery, hemorrhage, or infection. 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  LDA: L Ed[place] temporary HD line 5/7 >> 5/25 R midline PIV 5/6 >> out shiley trach 5/5 >> cortrak left nare 5/3  >> Rectal tube >> 6/1 Wound vac over sternum >> L IJ [facility] 5/25 >>  Interim history/subjective:  iHD scheduled for today Tolerating PMV  No issues  Awaiting LTAC placement  Objective   Blood pressure 137/64, pulse (!) 102, temperature 98.7 F (37.1 C), temperature source Oral, resp. rate 18, height 5\' 9"  (1.753 m), weight 86.5 kg, SpO2 100 %.    FiO2 (%):  [28 %] 28 %   Intake/Output Summary (Last 24 hours) at 09/26/2019 0812 Last data filed at 09/26/2019 0700 Gross per 24 hour  Intake 1475 ml  Output 750 ml  Net 725 ml   Danley Danker  Weights   09/24/19 1811 09/25/19 0500 09/26/19 0447  Weight: 84.2 kg 85.3 kg 86.5 kg    Examination:  General:  Chronically ill frail elderly male sitting in bedside recliner in NAD HEENT: MM pink/moist, midline 4 shiley, left nare cortrak Neuro: flat affect, follows commands, MAE, still weaker on LUE/ LLE CV: rr, no murmur PULM:  Non labored on ATC, clear anteriorly, faint bibasilar rales - with PMV on, hypophonia  GI: soft, bs active Extremities:  warm/dry, no LE edema  Skin: no rashes, sternal dressing CDI  Resolved   Cardiac tamponade on 4/23: Status post open chest for washout and clot removal. 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  Restrictive chest physiology status post sternotomy, poor chest wall compliance.  Baseline PFTs with mixed restrictive and obstructive defect, FEV1 50% predicted prior to surgery. - down sized to 4 cuffless shiley 5/28 P:  Will start capping trials today Further swallowing eval per SLP Continue nebs Continue trach care Ongoing aggressive pulmonary hygiene and PT (mobilize)   Remainder per primary team.  Awaiting LTAC placement.  Will see again tomorrow.     Kennieth Rad, MSN, AGACNP-BC [facility] 09/26/2019, 8:18 AM  See Shea Evans for personal pager PCCM on call pager [phone]

## 2019-09-26 NOTE — Evaluation (Signed)
Clinical/Bedside Swallow Evaluation Patient Details  Name: Christian [name] MRN: [id number] Date of Birth: 1946-03-11  Today's Date: 09/26/2019 Time: SLP Start Time (ACUTE ONLY): 0930 SLP Stop Time (ACUTE ONLY): 0942 SLP Time Calculation (min) (ACUTE ONLY): 12 min  Past Medical History:  Past Medical History:  Diagnosis Date  . BPH (benign prostatic hypertrophy)   . Coronary artery disease   . Diabetes mellitus without complication ([place])   . Dry eyes left  . Hiatal hernia   . 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  LIMA TO LAD, RIMA TO PDA, RADIAL ARTERY TO CIRC AND SEQUENTIALLY TO OM1. 08/15/2019  . Hyperlipidemia   . Hypertension   . Incomplete bladder emptying   . Nocturia   . 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 SOB (shortness of breath) on exertion    Past Surgical History:  Past Surgical History:  Procedure Laterality Date  . APPLICATION OF WOUND VAC N/A 08/24/2019   Procedure: APPLICATION OF WOUND VAC;  Surgeon: [name], MD;  Location: [place];  Service: Thoracic;  Laterality: N/A;  . APPLICATION OF WOUND VAC  08/29/2019   Procedure: Wound Vac change;  Surgeon: [name], MD;  Location: [place] OR;  Service: Open Heart Surgery;;  . APPLICATION OF WOUND VAC N/A 09/04/2019   Procedure: Application Of Wound Vac;  Surgeon: Grace Calieb, MD;  Location: [place];  Service: Open Heart Surgery;  Laterality: N/A;  . APPLICATION OF WOUND VAC N/A 09/06/2019   Procedure: APPLICATION OF ACELL, APPLICATION OF WOUND VAC USING PREVENA INCISIONAL  DRESSING;  Surgeon: Wallace Going, DO;  Location: [place];  Service: Plastics;  Laterality: N/A;  . CARDIAC CATHETERIZATION    . CORONARY ARTERY BYPASS GRAFT N/A 08/15/2019   Procedure: CORONARY ARTERY BYPASS GRAFTING (CABG), x 4 using bilateral IMAs and left radial artery .  LIMA TO LAD, RIMA TO  PDA, RADIAL ARTERY TO CIRC AND SEQUENTIALLY TO OM1.;  Surgeon: [name], MD;  Location: [place];  Service: Open Heart Surgery;  Laterality: N/A;  . CORONARY STENT PLACEMENT  02/27/2014   distal rt/pd coronary       dr Einar Gip  . CYSTO/ BLADDER BIOPSY'S/ CAUTHERIZATION  01-14-2004  DR Gaynelle Arabian  . EXPLORATION POST OPERATIVE OPEN HEART N/A 08/16/2019   Procedure: Chest Closure S?P CABG WITH APPLICATION OF PREVENA  INCISIONAL WOUND VAC;  Surgeon: [name], MD;  Location: [facility];  Service: Open Heart Surgery;  Laterality: N/A;  . EXPLORATION POST OPERATIVE OPEN HEART N/A 08/21/2019   Procedure: CHEST WASHOUT S/P OPEN CHEST;  Surgeon: [name], MD;  Location: [place];  Service: Open Heart Surgery;  Laterality: N/A;  Open chest with Esmark dressing with Ioban sealant coverage.  . EXPLORATION POST OPERATIVE OPEN HEART N/A 08/18/2019   Procedure: EXPLORATION POST OPERATIVE OPEN HEART (performed 04/23 on unit);  Surgeon: [name], MD;  Location: [place];  Service: Open Heart Surgery;  Laterality: N/A;  . EXPLORATION POST OPERATIVE OPEN HEART N/A 08/24/2019   Procedure: CHEST WASHOUT POST OPERATIVE OPEN HEART;  Surgeon: [name], MD;  Location: [place];  Service: Open Heart Surgery;  Laterality: N/A;  . EXPLORATION POST OPERATIVE OPEN HEART N/A 08/29/2019   Procedure: CHEST WOUND WASHOUT POST OPERATIVE OPEN HEART;  Surgeon: [name], MD;  Location[facility]  OR;  Service: Open Heart Surgery;  Laterality: N/A;  . EXPLORATION POST OPERATIVE OPEN HEART N/A 09/04/2019   Procedure: MEDIASTINAL EXPLORATION WITH STERNAL WOUND IRRIGATION;  Surgeon: Grace Lynx, MD;  Location: [place];  Service: Open Heart Surgery;  Laterality: N/A;  . EXPLORATION POST OPERATIVE OPEN HEART N/A 09/14/2019   Procedure: EVACUATION OF HEMATOMA;  Surgeon: Grace Kolbie, MD;  Location: [place];  Service: Open Heart Surgery;  Laterality: N/A;  . IR FLUORO GUIDE CV LINE LEFT  09/19/2019  . IR US GUIDE  VASC ACCESS LEFT  09/19/2019  . LAPAROSCOPIC LYSIS OF ADHESIONS N/A 09/06/2019   Procedure: LAPAROSCOPIC OMENTAL HARVEST;  Surgeon: Kinsinger, Arta Bruce, MD;  Location: [place];  Service: General;  Laterality: N/A;  . LEFT HEART CATH AND CORONARY ANGIOGRAPHY N/A 08/10/2019   Procedure: LEFT HEART CATH AND CORONARY ANGIOGRAPHY;  Surgeon: Nigel Mormon, MD;  Location: [place] CV LAB;  Service: Cardiovascular;  Laterality: N/A;  . LEFT HEART CATHETERIZATION WITH CORONARY ANGIOGRAM N/A 02/27/2014   Procedure: LEFT HEART CATHETERIZATION WITH CORONARY ANGIOGRAM;  Surgeon: Laverda Page, MD;  Location: [facility] CATH LAB;  Service: Cardiovascular;  Laterality: N/A;  . MEDIASTINAL EXPLORATION N/A 09/06/2019   Procedure: MEDIASTINAL EXPLORATION;  Surgeon: Grace Nigil, MD;  Location: [place];  Service: Thoracic;  Laterality: N/A;  . PECTORALIS FLAP  09/06/2019   Procedure: Pectoralis ADVANCEMENT Flap;  Surgeon: Wallace Going, DO;  Location: [place];  Service: Plastics;;  . PERCUTANEOUS CORONARY STENT INTERVENTION (PCI-S)  02/27/2014   Procedure: PERCUTANEOUS CORONARY STENT INTERVENTION (PCI-S);  Surgeon: Laverda Page, MD;  Location[facility] C[facility] LAB;  Service: Cardiovascular;;  rt PDA  3.0/28mm Promus stent  . RADIAL ARTERY HARVEST Left 08/15/2019   Procedure: Radial Artery Harvest;  Surgeon: [name], MD;  Location: [place];  Service: Open Heart Surgery;  Laterality: Left;  . RIB PLATING N/A 09/06/2019   Procedure: STERNAL PLATING;  Surgeon: Grace Verle, MD;  Location: [place];  Service: Thoracic;  Laterality: N/A;  . TEE WITHOUT CARDIOVERSION N/A 08/15/2019   Procedure: TRANSESOPHAGEAL ECHOCARDIOGRAM (TEE);  Surgeon: [name], MD;  Location: [place];  Service: Open Heart Surgery;  Laterality: N/A;  . TRACHEOSTOMY TUBE PLACEMENT  08/29/2019   Procedure: Tracheostomy;  Surgeon: [name], MD;  Location: [place] OR;  Service: Open Heart Surgery;;  . TRANSURETHRAL RESECTION OF  PROSTATE  04/04/2012   Procedure: TRANSURETHRAL RESECTION OF THE PROSTATE WITH GYRUS INSTRUMENTS;  Surgeon: Ailene Rud, MD;  Location: [place];  Service: Urology;  Laterality: N/A;  . TRANSURETHRAL RESECTION OF PROSTATE N/A 09/27/2014   Procedure: TRANSURETHRAL RESECTION OF THE PROSTATE ;  Surgeon: Carolan Clines, MD;  Location: [facility];  Service: Urology;  Laterality: N/A;  . UPPER GASTROINTESTINAL ENDOSCOPY     HPI:  74 yo admitted with NSTEMI 4/15, RLL PE, 4/20 CABG x 4 remained open with wound VAC with repeated washouts. 4/23 cardiac arrest, 4/29 initiated CRRT, vent s/p trach. 5/10 additional sternal washout unable to close.  5/12 sternal plating with pectoralis flap and omental harvest to mediastinum off CRRT with iHD started. Chest wound closed. Neurology consulted for left sided weakness- CTH neg 09/08/19. 5/20 pt with emergent hematoma evacuation and transition to trach collar; 5/28 downsized to #4 cuffless. PMHx: BPH, CAD, DM, HLD, HTN   Assessment / Plan / Recommendation Clinical Impression  Pt's swallow was assessed at bedside to determine readiness for instrumental swallow study.  He  was sitting in recliner after PT.  Affect remains flat; limited spontaneity when engaging with environment.  Pt wearing PMV; RT arrived and changed inner cannula to begin capping trials.  VS remained stable.  Oral mechansim exam was normal. Pt was provided with ice chips and teaspoons of water.  He demonstrated good anticipation/labial seal; prolonged oral preparation; palpable swallow, followed by consistent throat-clearing post-swallow.  Given risk factors for dysphagia, including prolonged critical illness, significant deconditioning, trach for several weeks and MS changes, recommend proceeding with instrumental study (MBS vs FEES) to assess physiology of swallow function.  Will wait for results of capping trial - if pt is decannulated soon, will plan for study s/p decannulation.  D/W RN.  SLP Visit Diagnosis: Aphonia (R49.1);Dysphagia, unspecified (R13.10)    Aspiration Risk    TBA   Diet Recommendation   NPO; Cortrak       Other  Recommendations Oral Care Recommendations: Oral care QID   Follow up Recommendations 24 hour supervision/assistance      Frequency and Duration min 2x/week  2 weeks       Prognosis Prognosis for Safe Diet Advancement: Good      Swallow Study   General Date of Onset: 08/09/19 HPI: 74 yo admitted with NSTEMI 4/15, RLL PE, 4/20 CABG x 4 remained open with wound VAC with repeated washouts. 4/23 cardiac arrest, 4/29 initiated CRRT, vent s/p trach. 5/10 additional sternal washout unable to close.  5/12 sternal plating with pectoralis flap and omental harvest to mediastinum off CRRT with iHD started. Chest wound closed. Neurology consulted for left sided weakness- CTH neg 09/08/19. 5/20 pt with emergent hematoma evacuation and transition to trach collar; 5/28 downsized to #4 cuffless. PMHx: BPH, CAD, DM, HLD, HTN Type of Study: Bedside Swallow Evaluation Previous Swallow Assessment: no Diet Prior to this Study: NPO;NG Tube Temperature Spikes Noted: No Respiratory Status: Trach Trach Size and Type: Uncuffed;#4 Behavior/Cognition: Alert Oral Cavity Assessment: Within Functional Limits Oral Care Completed by SLP: Recent completion by staff Oral Cavity - Dentition: Adequate natural dentition Self-Feeding Abilities: Total assist Patient Positioning: Upright in chair Baseline Vocal Quality: Low vocal intensity Volitional Cough: Strong Volitional Swallow: Able to elicit    Oral/Motor/Sensory Function Overall Oral Motor/Sensory Function: Within functional limits   Ice Chips Ice chips: Impaired Presentation: Spoon Oral Phase Functional Implications: Prolonged oral transit Pharyngeal Phase Impairments: Throat Clearing - Immediate   Thin Liquid Thin Liquid: Impaired Presentation: Spoon Oral Phase Functional Implications: Prolonged  oral transit Pharyngeal  Phase Impairments: Throat Clearing - Immediate    Nectar Thick Nectar Thick Liquid: Not tested   Honey Thick Honey Thick Liquid: Not tested   Puree Puree: Not tested   Solid     Solid: Not tested      [name] 09/26/2019,10:14 AM  Estill Bamberg L. Tivis Ringer, [place] Office number [phone] Pager [phone]

## 2019-09-26 NOTE — Progress Notes (Signed)
Inpatient Rehabilitation-Admissions Coordinator   Therapy continues to recommend LTAC. Note wife is also agreeable to SNF placement and pt is being faxed out. As the pt still has not shown enough tolerance for CIR, AC will officially sign off.   Please call if questions.   Raechel Ache, OTR/L  Rehab Admissions Coordinator  [phone] 09/26/2019 12:48 PM

## 2019-09-26 NOTE — Progress Notes (Signed)
Physical Therapy Treatment Patient Details Name: Christian [name] MRN: [id number] DOB: 15-Sep-1945 Today's Date: 09/26/2019    History of Present Illness 74 yo admitted with NSTEMI 4/15, RLL PE, 4/20 CABG x 4 remained open with wound VAC with repeated washouts. 4/23 cardiac arrest, 4/ 29 initiated CRRT, vent s/p trach. 5/10 additional sternal washout unable to close.  5/12 sternal plating with pectoralis flap and omental harvest to mediastinum off CRRT with iHD started. Chest wound closed. Neurology consulted for left sided weakness- CTH neg 09/08/19. 5/20 pt with emergent hematoma evacuation and transition to trach collar. 5/24 down sized to 6 trach. PMHx: BPH, CAD, DM, HLD, HTN    PT Comments    Pt awake but with initially arrival pt with fluttering eye lids grossly 30 sec before able to cease and then pt frequently with either blank stare or wide eyes with limited vocalization despite cues and PMSV. Pt maintains limited trunk control and strength with continued need for assist with all movement and HEP. Performed passive neck stretching with pt able to achieve grossly 15 degrees left neck rotation and 10 degrees left lateral flexion. Pt sat EOB 8 min for trunk and pelvic facilitation for sitting balance. Will continue to follow.    HR 102 98% on 5L trach collar 28% 137/64 (83)   Follow Up Recommendations  LTACH;Supervision/Assistance - 24 hour     Equipment Recommendations  Other (comment)(defer to next venue)    Recommendations for Other Services       Precautions / Restrictions Precautions Precautions: Fall;Sternal Precaution Comments: trach, sternal flap    Mobility  Bed Mobility Overal bed mobility: Needs Assistance Bed Mobility: Rolling;Supine to Sit Rolling: Max assist         General bed mobility comments: max assist to roll bil. Utilized chair egress to transition to sitting then mod assist for trunk and neck extension, max scooting  Transfers Overall transfer  level: Needs assistance   Transfers: Sit to/from Stand Sit to Stand: Max assist;+2 physical assistance         General transfer comment: from chair egress able to stand grossly 10 sec with use of pad and bil knees blocked, pt with very limited initiation and engagement to activate trunk and leg extension. Returned to sitting and lifted to chair  Ambulation/Gait             General Gait Details: unable   Stairs             Wheelchair Mobility    Modified Rankin (Stroke Patients Only)       Balance Overall balance assessment: Needs assistance Sitting-balance support: Bilateral upper extremity supported;No upper extremity supported;Feet supported Sitting balance-Leahy Scale: Zero Sitting balance - Comments: pt with anterior left lean EOB with mod -max assist for trunk support and facilitation of trunk and neck extension. EOB 8 min                                    Cognition Arousal/Alertness: Awake/alert Behavior During Therapy: Flat affect Overall Cognitive Status: Difficult to assess                         Following Commands: Follows one step commands inconsistently       General Comments: pt mouthing responses and even with PMSV and cueing pt would only state name and either not respond or only shake head  Exercises General Exercises - Upper Extremity Shoulder Flexion: AAROM;Both;Seated;5 reps General Exercises - Lower Extremity Short Arc QuadSinclair Ship;Both;Seated;10 reps Heel Slides: AAROM;Both;10 reps;Supine Hip ABduction/ADduction: AAROM;Both;10 reps;Supine    General Comments        Pertinent Vitals/Pain Pain Score: 7  Pain Location: nose with cortrak, neck with rotation and stretching Pain Descriptors / Indicators: Discomfort;Grimacing Pain Intervention(s): Limited activity within patient's tolerance;Monitored during session;Repositioned    Home Living                      Prior Function             PT Goals (current goals can now be found in the care plan section) Acute Rehab PT Goals Time For Goal Achievement: 10/10/19 Potential to Achieve Goals: Fair Progress towards PT goals: Progressing toward goals(goals remain appropriate with date updated)    Frequency    Min 2X/week      PT Plan Current plan remains appropriate;Frequency needs to be updated    Co-evaluation              AM-PAC PT "6 Clicks" Mobility   Outcome Measure  Help needed turning from your back to your side while in a flat bed without using bedrails?: Total Help needed moving from lying on your back to sitting on the side of a flat bed without using bedrails?: Total Help needed moving to and from a bed to a chair (including a wheelchair)?: Total Help needed standing up from a chair using your arms (e.g., wheelchair or bedside chair)?: Total Help needed to walk in hospital room?: Total Help needed climbing 3-5 steps with a railing? : Total 6 Click Score: 6    End of Session Equipment Utilized During Treatment: Oxygen Activity Tolerance: Patient tolerated treatment well Patient left: in chair;with call bell/phone within reach;with chair alarm set;with nursing/sitter in room Nurse Communication: Mobility status;Need for lift equipment PT Visit Diagnosis: Other abnormalities of gait and mobility (R26.89);Muscle weakness (generalized) (M62.81);Other symptoms and signs involving the nervous system (R29.898)     Time: 5053-9767 PT Time Calculation (min) (ACUTE ONLY): 46 min  Charges:  $Therapeutic Exercise: 8-22 mins $Therapeutic Activity: 23-37 mins                     Michiah Mudry P, PT Acute Rehabilitation Services Pager: [phone] Office: [place] Geralyn Figiel 09/26/2019, 9:49 AM

## 2019-09-27 LAB — RENAL FUNCTION PANEL
Albumin: 2.4 g/dL — ABNORMAL LOW (ref 3.5–5.0)
Anion gap: 13 (ref 5–15)
BUN: 85 mg/dL — ABNORMAL HIGH (ref 8–23)
CO2: 25 mmol/L (ref 22–32)
Calcium: 8.8 mg/dL — ABNORMAL LOW (ref 8.9–10.3)
Chloride: 95 mmol/L — ABNORMAL LOW (ref 98–111)
Creatinine, Ser: 4.27 mg/dL — ABNORMAL HIGH (ref 0.61–1.24)
GFR calc Af Amer: 15 mL/min — ABNORMAL LOW (ref >60)
GFR calc non Af Amer: 13 mL/min — ABNORMAL LOW (ref >60)
Glucose, Bld: 174 mg/dL — ABNORMAL HIGH (ref 70–99)
Phosphorus: 4.5 mg/dL (ref 2.5–4.6)
Potassium: 4 mmol/L (ref 3.5–5.1)
Sodium: 133 mmol/L — ABNORMAL LOW (ref 135–145)

## 2019-09-27 LAB — GLUCOSE, CAPILLARY
Glucose-Capillary: 166 mg/dL — ABNORMAL HIGH (ref 70–99)
Glucose-Capillary: 157 mg/dL — ABNORMAL HIGH (ref 70–99)
Glucose-Capillary: 176 mg/dL — ABNORMAL HIGH (ref 70–99)
Glucose-Capillary: 160 mg/dL — ABNORMAL HIGH (ref 70–99)
Glucose-Capillary: 198 mg/dL — ABNORMAL HIGH (ref 70–99)
Glucose-Capillary: 200 mg/dL — ABNORMAL HIGH (ref 70–99)

## 2019-09-27 LAB — MAGNESIUM: Magnesium: 2.5 mg/dL — ABNORMAL HIGH (ref 1.7–2.4)

## 2019-09-27 MED ORDER — CHLORHEXIDINE GLUCONATE CLOTH 2 % EX PADS
6.0000 | MEDICATED_PAD | Freq: Every day | CUTANEOUS | Status: DC
  Administered 2019-09-27: 6 via TOPICAL

## 2019-09-27 MED ORDER — DARBEPOETIN ALFA 100 MCG/0.5ML IJ SOSY
100.0000 ug | PREFILLED_SYRINGE | INTRAMUSCULAR | Status: DC
  Filled 2019-09-27: qty 0.5

## 2019-09-27 NOTE — Progress Notes (Signed)
  PCCM Interval Note  - tolerating capping trial thus far since 6/1.  No distress on room air.  Will continue to monitor to determine if we will decanulate in several days.       Kennieth Rad, MSN, AGACNP-BC [facility] 09/27/2019, 11:05 AM  See Shea Evans for personal pager PCCM on call pager [phone]

## 2019-09-27 NOTE — Progress Notes (Signed)
Patient due for a tracheostomy change.  Per nurse practitioner, we will hold trach change for a few days in hopes of decannulation.

## 2019-09-27 NOTE — TOC Progression Note (Addendum)
Transition of Care [facility]) - Progression Note    Patient Details  Name: Christian [name] MRN: [id number] Date of Birth: 14-Apr-1946  Transition of Care [facility]) CM/SW [place], [place] Phone Number: 09/27/2019, 1:32 PM  Clinical Narrative:     Update:CSW touched base with renal navigator to let her know SNF choice is pending. CSW and renal navigator will continue to follow. Insurance authorization has been started. Reference number is [id number]. The start date is for 6/3.   Pending SNF choice. Pending insurance authorization.  CSW will continue to follow.  CSW spoke with patient and patients spouse Christian [name] at bedside. CSW offered SNF choice to patient and Patients spouse. Patients spouse wants to think about choice and CSW will follow up with her tomorrow. CSW gave patients spouse Christian [name] resources for [facility] that she can look over. CSW will start insurance authorization for patient.  Pending SNF choice. Pending insurance authorization.  Expected Discharge Plan: [place] Barriers to Discharge: Continued Medical Work up  Expected Discharge Plan and Services Expected Discharge Plan: [place] arrangements for the past 2 months: Single Family Home                                       Social Determinants of Health (SDOH) Interventions    Readmission Risk Interventions No flowsheet data found.

## 2019-09-27 NOTE — Progress Notes (Signed)
[name] NEPHROLOGY PROGRESS NOTE  Assessment/ Plan: Pt is a 74 y.o. yo male is status post CABG and had a cardiac arrest due to tamponade, developed AKI requiring dialysis.  Previously the creatinine level was 1.22 in 06/2019.  #Acute kidney injury following cardiac cath, CABG, cardiogenic shock and cardiac arrest.  Started CRRT from 4/29-5/12 to manage volume.  He is now tolerating intermittent hemodialysis.   No sign of renal recovery so far.  Had HD yesterday with around 2.5 L UF, tolerated well.  Plan to continue TTS schedule and check lab before HD. He will need arrangement for OP HD for AKI, complicated by tracheostomy status.  Discussed with renal navigator. Status post right IJ TDC by IR on 5/25.  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  # Postoperative acute hypoxic respiratory failure on h/o COPD- reintubated 08/18/19 and likely aspiration.  Clinically doing well and now the tracheostomy is capped.  # Anemia of critical illness: Iron saturation 14% therefore starting IV iron with dialysis.  Continue ESA.  Monitor hemoglobin.  #CKD-MBD: Continue Renvela.  Monitor Phos level.  # A fib per primary.  #Dispo - LTACH planned.   Subjective: Seen and examined at bedside.  Tolerated dialysis well yesterday.  The tracheostomy is capped.  He is more alert awake.  No new event.  Around 100 cc of urine in the bladder per nurse.  Objective Vital signs in last 24 hours: Vitals:   09/27/19 0600 09/27/19 0700 09/27/19 0738 09/27/19 0808  BP: 128/65 129/65  (!) 143/68  Pulse: (!) 102 (!) 102  (!) 102  Resp: 16 17  18   Temp:   99.1 F (37.3 C)   TempSrc:      SpO2: 99% 99%  99%  Weight:      Height:       Weight change: 2.5  kg  Intake/Output Summary (Last 24 hours) at 09/27/2019 0825 Last data filed at 09/27/2019 0800 Gross per 24 hour  Intake 1115 ml  Output 2500 ml  Net -1385 ml       Labs: Basic Metabolic Panel: Recent Labs  Lab 09/25/19 0229 09/26/19 0345 09/27/19 0219  NA 135 133* 133*  K 3.7 4.0 4.0  CL 95* 93* 95*  CO2 26 24 25   GLUCOSE 149* 161* 174*  BUN 90* 142* 85*  CREATININE 4.81* 6.55* 4.27*  CALCIUM 9.0 9.1 8.8*  PHOS 4.5 6.1* 4.5   Liver Function Tests: Recent Labs  Lab 09/25/19 0229 09/26/19 0345 09/27/19 0219  ALBUMIN 2.5* 2.5* 2.4*   No results for input(s): LIPASE, AMYLASE in the last 168 hours. No results for input(s): AMMONIA in the last 168 hours. CBC: Recent Labs  Lab 09/22/19 0224 09/22/19 0224 09/23/19 0238 09/23/19 0238 09/24/19 0234 09/25/19 0229 09/26/19 1116  WBC 14.2*   < > 15.1*   < > 14.9* 15.7* 18.5*  HGB 8.8*   < > 9.2*   < > 9.1* 8.7* 8.7*  HCT 28.1*   < > 28.9*   < > 28.9* 27.2* 27.1*  MCV 99.6  --  99.7  --  98.6 98.2 98.5  PLT 357   < > 364   < > 354 300 338   < > = values in this interval not displayed.  Cardiac Enzymes: No results for input(s): CKTOTAL, CKMB, CKMBINDEX, TROPONINI in the last 168 hours. CBG: Recent Labs  Lab 09/26/19 1641 09/26/19 1959 09/26/19 2312 09/27/19 0324 09/27/19 0735  GLUCAP 245* 158* 195* 166* 157*    Iron Studies:  Recent Labs    09/26/19 0345  IRON 36*  TIBC 260  FERRITIN 511*   Studies/Results: No results found.  Medications: Infusions: . sodium chloride Stopped (09/14/19 0802)  . sodium chloride    . sodium chloride    . sodium chloride    . sodium chloride    . feeding supplement (NEPRO CARB STEADY) 1,000 mL (09/26/19 1853)  . ferric gluconate (FERRLECIT/NULECIT) IV 125 mg (09/26/19 1155)  . lactated ringers 20 mL/hr at 08/20/19 1200    Scheduled Medications: .  stroke: mapping our early stages of recovery book   Does not apply Once  . alteplase  2 mg Intracatheter Once  .  amiodarone  200 mg Per Tube BID  . arformoterol  15 mcg Nebulization BID  . aspirin  81 mg Per Tube Daily  . atorvastatin  40 mg Per Tube Daily  . bisacodyl  10 mg Rectal Daily  . chlorhexidine  15 mL Mouth Rinse BID  . Chlorhexidine Gluconate Cloth  6 each Topical Q0600  . Chlorhexidine Gluconate Cloth  6 each Topical Q0600  . clonazePAM  0.5 mg Per Tube BID  . darbepoetin (ARANESP) injection - NON-DIALYSIS  150 mcg Subcutaneous Q Mon-1800  . feeding supplement (PRO-STAT SUGAR FREE 64)  60 mL Per Tube TID  . insulin aspart  0-15 Units Subcutaneous Q4H  . insulin detemir  10 Units Subcutaneous BID  . mouth rinse  15 mL Mouth Rinse q12n4p  . midodrine  5 mg Per Tube TID WC  . multivitamin  1 tablet Oral QHS  . oxyCODONE  5 mg Per Tube Q6H  . pantoprazole sodium  40 mg Per Tube Daily  . revefenacin  175 mcg Nebulization Daily  . sertraline  25 mg Per Tube Daily  . sevelamer carbonate  0.8 g Per Tube TID WC  . trospium  20 mg Per Tube BID    have reviewed scheduled and prn medications.  Physical Exam: General:NAD, lying on bed with tracheostomy capped. Heart:RRR, s1s2 nl Lungs: Clear bilateral, no wheezing Abdomen:soft, Non-tender, non-distended Extremities:No edema Dialysis Access: Right IJ TDC.  Jazziel Fitzsimmons Prasad Jahlil Ziller 09/27/2019,8:25 AM  LOS: 49 days  Pager: [phone]

## 2019-09-27 NOTE — Progress Notes (Addendum)
Nutrition Follow-up  DOCUMENTATION CODES:   Not applicable  INTERVENTION:   Monitor results of swallow study  Continue tube feeding:  -Nepro @ 45 ml/hr via post-pyloric Cortrak (1080 ml) -Pro-Stat 60 mL TID  Provides 2544 kcals, 177 g of protein and 788 mL of free water Meets 100% estimated needs  Continue Renal MVI  NUTRITION DIAGNOSIS:   Increased nutrient needs related to post-op healing as evidenced by estimated needs.  Ongoing  GOAL:   Patient will meet greater than or equal to 90% of their needs  Addressed via TF  MONITOR:   Vent status, Skin, TF tolerance, Weight trends, I & O's, Labs  REASON FOR ASSESSMENT:   Ventilator    ASSESSMENT:   Patient with PMH significant for CAD s/p stenting, HTN, HLD, DM, and BPH. Presents this admission with RLL PE and for CABG.   4/14- admitted  4/20- CABG x 4, chest left open 4/22- chest closure, application of incisional wound vac 4/23- extubated, cardiac arrest, chest re-opened for washout, clot removed, re-intubated 4/26- return to OR for washout, cortrak placed, TF initiated 4/29- chest washout, application of wound VAC, CRRT initiated 5/5- chest washout, tracheostomy 5/10- chest washout, wound VAC 5/12- omental harvest, R pectoral muscle advancement flap, layered closure of sternal wound, wound VAC, stop CRRT 5/13- first HD 5/20- mediastinal exploration, evacuation of hematoma, wound VAC 5/24- trach downsized 6 cuffless 5/25- L tunneled HD cath 5/28- trach downsized 4 cuffless   Trach capped yesterday. Tolerating well. Originally planned to have PEG placed last week but holding off to assess swallowing function. No scheduled date for MBS. RD to add supplementation when appropriate and decrease tube feeding as intake progresses. Per RN, pt with decreased stool output yesterday. Dulcolax added per CCM.   Admission weight: 97 kg  Current weight: 85.9 kg (stable over the last week)  I/O: +6,507 ml since  5/19 Last HD 6/1: 2500 ml net UF  Medications: dulcolax, aranesp, SS novolog, levemir, rena-vit, renvela  Labs: Na 133 (L) Mg 2.5 (H) CBG 157-245  Diet Order:   Diet Order    None      EDUCATION NEEDS:   Not appropriate for education at this time  Skin:  Skin Assessment: Skin Integrity Issues: Skin Integrity Issues:: Stage II, Incisions Stage II: R buttocks  Incisions: chest, L arm, L abdomen, neck  Last BM:  6/2  Height:   Ht Readings from Last 1 Encounters:  08/28/19 5\' 9"  (1.753 m)    Weight:   Wt Readings from Last 1 Encounters:  09/27/19 85.9 kg   BMI:  Body mass index is 27.97 kg/m.  Estimated Nutritional Needs:   Kcal:  2125-2550  Protein:  170-195 grams  Fluid:  >/= 2 L/day   Mariana Single RD, LDN Clinical Nutrition Pager listed in [place]

## 2019-09-27 NOTE — Progress Notes (Signed)
Renal Navigator appreciates call from CM/A. [employer] on plan for patient, which is now SNF at discharge. Therefore, Navigator will refer him for OP HD treatment when appropriate. Navigator reviewed 6/2 note by RRT, which states that they are holding trach change for a few days in the hopes of decannulation. Navigator will also await the hope of decannulation in order to refer patient. He will also need to be able to tolerate HD in the acute unit in a recliner before he will be appropriate for an OP HD referral.  Navigator will follow closely.  Alphonzo Cruise, [place] Renal Navigator [phone]

## 2019-09-28 ENCOUNTER — Inpatient Hospital Stay (HOSPITAL_COMMUNITY): Payer: Medicare Other

## 2019-09-28 LAB — RENAL FUNCTION PANEL
Albumin: 2.4 g/dL — ABNORMAL LOW (ref 3.5–5.0)
Anion gap: 16 — ABNORMAL HIGH (ref 5–15)
BUN: 142 mg/dL — ABNORMAL HIGH (ref 8–23)
CO2: 23 mmol/L (ref 22–32)
Calcium: 8.9 mg/dL (ref 8.9–10.3)
Chloride: 95 mmol/L — ABNORMAL LOW (ref 98–111)
Creatinine, Ser: 6.08 mg/dL — ABNORMAL HIGH (ref 0.61–1.24)
GFR calc Af Amer: 10 mL/min — ABNORMAL LOW (ref >60)
GFR calc non Af Amer: 8 mL/min — ABNORMAL LOW (ref >60)
Glucose, Bld: 147 mg/dL — ABNORMAL HIGH (ref 70–99)
Phosphorus: 5.3 mg/dL — ABNORMAL HIGH (ref 2.5–4.6)
Potassium: 4.3 mmol/L (ref 3.5–5.1)
Sodium: 134 mmol/L — ABNORMAL LOW (ref 135–145)

## 2019-09-28 LAB — GLUCOSE, CAPILLARY
Glucose-Capillary: 147 mg/dL — ABNORMAL HIGH (ref 70–99)
Glucose-Capillary: 156 mg/dL — ABNORMAL HIGH (ref 70–99)
Glucose-Capillary: 171 mg/dL — ABNORMAL HIGH (ref 70–99)
Glucose-Capillary: 173 mg/dL — ABNORMAL HIGH (ref 70–99)
Glucose-Capillary: 179 mg/dL — ABNORMAL HIGH (ref 70–99)
Glucose-Capillary: 196 mg/dL — ABNORMAL HIGH (ref 70–99)
Glucose-Capillary: 226 mg/dL — ABNORMAL HIGH (ref 70–99)

## 2019-09-28 LAB — MAGNESIUM: Magnesium: 3 mg/dL — ABNORMAL HIGH (ref 1.7–2.4)

## 2019-09-28 MED ORDER — SODIUM CHLORIDE 0.9 % IV SOLN: 100.0000 mL | INTRAVENOUS | Status: DC | PRN

## 2019-09-28 MED ORDER — LIDOCAINE-PRILOCAINE 2.5-2.5 % EX CREA: 1.0000 "application " | TOPICAL_CREAM | CUTANEOUS | Status: DC | PRN

## 2019-09-28 MED ORDER — HEPARIN SODIUM (PORCINE) 1000 UNIT/ML DIALYSIS: 20.0000 [IU]/kg | INTRAMUSCULAR | Status: DC | PRN

## 2019-09-28 MED ORDER — ALTEPLASE 2 MG IJ SOLR: 2.0000 mg | Freq: Once | INTRAMUSCULAR | Status: DC | PRN

## 2019-09-28 MED ORDER — LIDOCAINE HCL (PF) 1 % IJ SOLN: 5.0000 mL | INTRAMUSCULAR | Status: DC | PRN

## 2019-09-28 MED ORDER — PENTAFLUOROPROP-TETRAFLUOROETH EX AERO: 1.0000 "application " | INHALATION_SPRAY | CUTANEOUS | Status: DC | PRN

## 2019-09-28 MED ORDER — HEPARIN SODIUM (PORCINE) 1000 UNIT/ML IJ SOLN
INTRAMUSCULAR | Status: AC
  Administered 2019-09-28: 3800 [IU] via INTRAVENOUS_CENTRAL
  Filled 2019-09-28: qty 4

## 2019-09-28 MED ORDER — HEPARIN SODIUM (PORCINE) 1000 UNIT/ML DIALYSIS: 1000.0000 [IU] | INTRAMUSCULAR | Status: DC | PRN

## 2019-09-28 NOTE — Progress Notes (Signed)
[facility] NEPHROLOGY PROGRESS NOTE  Assessment/ Plan: Pt is a 74 y.o. yo male is status post CABG and had a cardiac arrest due to tamponade, developed AKI requiring dialysis.  Previously the creatinine level was 1.22 in 06/2019.  #Acute kidney injury following cardiac cath, CABG, cardiogenic shock and cardiac arrest.  Started CRRT from 4/29-5/12 to manage volume.  He is now tolerating intermittent hemodialysis.   No sign of renal recovery so far. Plan to continue TTS schedule, HD today. He will need arrangement for OP HD for AKI, trach is capped now.  Discussed with renal navigator. Status post right IJ TDC by IR on 5/25.  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  # Postoperative acute hypoxic respiratory failure on h/o COPD- reintubated 08/18/19 and likely aspiration.  Clinically doing well and now the tracheostomy is capped.  # Anemia of critical illness: Iron saturation 14% therefore starting IV iron with dialysis.  Continue ESA.  Monitor hemoglobin.  #CKD-MBD: Continue Renvela.  Monitor Phos level.  # A fib per primary.  #Dispo - LTACH planned. Plan for gastric feeding tube placement noted.  Subjective: Seen and examined at bedside.  No new event.  Remains anuric.  Tracheostomy remains capped.  Plan for dialysis today. Objective Vital signs in last 24 hours: Vitals:   09/28/19 0354 09/28/19 0421 09/28/19 0735 09/28/19 0804  BP: (!) 154/66 (!) 154/66 (!) 142/60   Pulse: (!) 101 (!) 102 (!) 104 (!) 105  Resp: 20 17 19 14   Temp: 98.6 F (37 C)  98.8 F (37.1 C)   TempSrc: Oral  Oral   SpO2: 97%  97% 97%  Weight: 85.4 kg     Height:       Weight change: -3.6 kg  Intake/Output Summary (Last 24 hours) at 09/28/2019 0833 Last data filed  at 09/28/2019 0600 Gross per 24 hour  Intake 1350 ml  Output 0 ml  Net 1350 ml       Labs: Basic Metabolic Panel: Recent Labs  Lab 09/26/19 0345 09/27/19 0219 09/28/19 0445  NA 133* 133* 134*  K 4.0 4.0 4.3  CL 93* 95* 95*  CO2 24 25 23   GLUCOSE 161* 174* 147*  BUN 142* 85* 142*  CREATININE 6.55* 4.27* 6.08*  CALCIUM 9.1 8.8* 8.9  PHOS 6.1* 4.5 5.3*   Liver Function Tests: Recent Labs  Lab 09/26/19 0345 09/27/19 0219 09/28/19 0445  ALBUMIN 2.5* 2.4* 2.4*   No results for input(s): LIPASE, AMYLASE in the last 168 hours. No results for input(s): AMMONIA in the last 168 hours. CBC: Recent Labs  Lab 09/22/19 0224 09/22/19 0224 09/23/19 0238 09/23/19 0238 09/24/19 0234 09/25/19 0229 09/26/19 1116  WBC 14.2*   < > 15.1*   < > 14.9* 15.7* 18.5*  HGB 8.8*   < > 9.2*   < > 9.1* 8.7* 8.7*  HCT 28.1*   < > 28.9*   < > 28.9* 27.2* 27.1*  MCV 99.6  --  99.7  --  98.6 98.2 98.5  PLT 357   < > 364   < > 354 300 338   < > = values in this interval not displayed.   Cardiac Enzymes: No results for input(s): CKTOTAL, CKMB, CKMBINDEX, TROPONINI in the last 168 hours. CBG: Recent Labs  Lab 09/27/19 1625 09/27/19 2000 09/27/19 2010 09/28/19 0013 09/28/19 0355  GLUCAP 160* 200* 198* 171* 156*    Iron Studies:  Recent Labs    09/26/19 0345  IRON 36*  TIBC 260  FERRITIN 511*   Studies/Results: No results found.  Medications: Infusions: . sodium chloride Stopped (09/14/19 0802)  . sodium chloride    . sodium chloride    . sodium chloride    . sodium chloride    . feeding supplement (NEPRO CARB STEADY) 45 mL/hr at 09/28/19 0600  . ferric gluconate (FERRLECIT/NULECIT) IV 125 mg (09/26/19 1155)  . lactated ringers 20 mL/hr at 08/20/19 1200    Scheduled Medications: .  stroke: mapping our early stages of recovery book   Does not apply Once  . alteplase  2 mg Intracatheter Once  . amiodarone  200 mg Per Tube BID  . arformoterol  15 mcg Nebulization BID  .  aspirin  81 mg Per Tube Daily  . atorvastatin  40 mg Per Tube Daily  . bisacodyl  10 mg Rectal Daily  . chlorhexidine  15 mL Mouth Rinse BID  . Chlorhexidine Gluconate Cloth  6 each Topical Q0600  . Chlorhexidine Gluconate Cloth  6 each Topical Q0600  . Chlorhexidine Gluconate Cloth  6 each Topical Q0600  . clonazePAM  0.5 mg Per Tube BID  . darbepoetin (ARANESP) injection - DIALYSIS  100 mcg Intravenous Q Thu-HD  . feeding supplement (PRO-STAT SUGAR FREE 64)  60 mL Per Tube TID  . insulin aspart  0-15 Units Subcutaneous Q4H  . insulin detemir  10 Units Subcutaneous BID  . mouth rinse  15 mL Mouth Rinse q12n4p  . midodrine  5 mg Per Tube TID WC  . multivitamin  1 tablet Oral QHS  . oxyCODONE  5 mg Per Tube Q6H  . pantoprazole sodium  40 mg Per Tube Daily  . revefenacin  175 mcg Nebulization Daily  . sertraline  25 mg Per Tube Daily  . sevelamer carbonate  0.8 g Per Tube TID WC  . trospium  20 mg Per Tube BID    have reviewed scheduled and prn medications.  Physical Exam: General:NAD, lying on bed with tracheostomy capped. Heart:RRR, s1s2 nl Lungs: Basal rhonchi., no wheezing Abdomen:soft, Non-tender, non-distended Extremities:No edema Dialysis Access: Right IJ TDC.  Charlei Ramsaran Prasad Deuce Paternoster 09/28/2019,8:33 AM  LOS: 50 days  Pager: [phone]

## 2019-09-28 NOTE — Plan of Care (Signed)
  Problem: Education: °Goal: Knowledge of General Education information will improve °Description: Including pain rating scale, medication(s)/side effects and non-pharmacologic comfort measures °Outcome: Progressing °  °Problem: Clinical Measurements: °Goal: Will remain free from infection °Outcome: Progressing °Goal: Diagnostic test results will improve °Outcome: Progressing °  °Problem: Nutrition: °Goal: Adequate nutrition will be maintained °Outcome: Progressing °  °

## 2019-09-28 NOTE — Progress Notes (Signed)
Went to see patient this afternoon, patient was at hemodialysis.  Spoke with nursing staff, they reported that they changed his Mepilex border dressing yesterday upon arrival to new room.  Provided nursing staff with Aquacel Ag dressing, recommend changing Mepilex to this dressing in approximately 2 days

## 2019-09-28 NOTE — Progress Notes (Signed)
Pt with bladder scan result=333. Dr Hollie Salk informed. No new orders made.

## 2019-09-28 NOTE — Progress Notes (Signed)
Dr. Pearson Grippe and made aware that the pt's HD catheter is still bleeding afte pressure applied and dressing change. He advised to call IR for assistance, called IR and they sent an RN, to address the issue. IR, RN on the unit working with the patient.

## 2019-09-28 NOTE — Plan of Care (Signed)

## 2019-09-28 NOTE — Progress Notes (Signed)
SLP Cancellation Note  Patient Details Name: Christian [name] MRN: [id number] DOB: [birth year]   Cancelled treatment:       Reason Eval/Treat Not Completed: Other (comment)(Trach still capped with hopes of soon decannulation. Instrumental assessment will be deferred until decannulation.)  Doris Mcgilvery I. Hardin Negus, [place], [place] Office number [phone] Pager [place] 09/28/2019, 8:14 AM

## 2019-09-28 NOTE — Progress Notes (Signed)
14 Days Post-Op  Subjective: Patient doing better, wife at chairside. Patient being brought to progressive care bed.    Objective: Vital signs in last 24 hours: Temp:  [98.3 F (36.8 C)-99.1 F (37.3 C)] 98.6 F (37 C) (06/03 0354) Pulse Rate:  [101-104] 102 (06/03 0421) Resp:  [14-22] 17 (06/03 0421) BP: (109-154)/(60-74) 154/66 (06/03 0421) SpO2:  [95 %-100 %] 97 % (06/03 0354) FiO2 (%):  [21 %] 21 % (06/03 0421) Weight:  [85.4 kg] 85.4 kg (06/03 0354) Last BM Date: 09/27/19  Intake/Output from previous day: 06/02 0701 - 06/03 0700 In: 1475 [NG/GT:1475] Out: 0  Intake/Output this shift: No intake/output data recorded.  General appearance: alert, cooperative, no distress and resting in recliner Head: Normocephalic, without obvious abnormality, atraumatic Chest wall: tenderness improved, incision in-tact, sutures in place. Some scabbing noted at distal incision, no sloughing noted. Minimal drainage noted on dressing. No foul odor. No purulence. No fluid collection or fluid wave noted. Mepilex border dressing in place.  Lab Results:  CBC Latest Ref Rng & Units 09/26/2019 09/25/2019 09/24/2019  WBC 4.0 - 10.5 K/uL 18.5(H) 15.7(H) 14.9(H)  Hemoglobin 13.0 - 17.0 g/dL 8.7(L) 8.7(L) 9.1(L)  Hematocrit 39.0 - 52.0 % 27.1(L) 27.2(L) 28.9(L)  Platelets 150 - 400 K/uL 338 300 354    BMET Recent Labs    09/27/19 0219 09/28/19 0445  NA 133* 134*  K 4.0 4.3  CL 95* 95*  CO2 25 23  GLUCOSE 174* 147*  BUN 85* 142*  CREATININE 4.27* 6.08*  CALCIUM 8.8* 8.9   PT/INR No results for input(s): LABPROT, INR in the last 72 hours. ABG No results for input(s): PHART, HCO3 in the last 72 hours.  Invalid input(s): PCO2, PO2  Studies/Results: No results found.  Anti-infectives: Anti-infectives (From admission, onward)   Start     Dose/Rate Route Frequency Ordered Stop   09/19/19 1417  ceFAZolin (ANCEF) 2-4 GM/100ML-% IVPB    Note to Pharmacy: Rudene Re   : cabinet override    09/19/19 1417 09/20/19 0229   09/19/19 1330  ceFAZolin (ANCEF) IVPB 1 g/50 mL premix  Status:  Discontinued     1 g 100 mL/hr over 30 Minutes Intravenous Once 09/19/19 1317 09/19/19 1329   09/19/19 1330  ceFAZolin (ANCEF) IVPB 2g/100 mL premix     2 g 200 mL/hr over 30 Minutes Intravenous  Once 09/19/19 1327 09/19/19 1448   09/14/19 0915  vancomycin (VANCOCIN) IVPB 1000 mg/200 mL premix     1,000 mg 200 mL/hr over 60 Minutes Intravenous To Surgery 09/14/19 0907 09/15/19 0915   09/07/19 2000  vancomycin (VANCOREADY) IVPB 750 mg/150 mL     750 mg 150 mL/hr over 60 Minutes Intravenous  Once 09/07/19 1510 09/07/19 2245   09/06/19 2200  meropenem (MERREM) 500 mg in sodium chloride 0.9 % 100 mL IVPB     500 mg 200 mL/hr over 30 Minutes Intravenous Every 24 hours 09/06/19 1542 09/09/19 0015   09/06/19 2000  fluconazole (DIFLUCAN) IVPB 400 mg     400 mg 100 mL/hr over 120 Minutes Intravenous Every 24 hours 09/06/19 1542 09/08/19 2240   09/06/19 1800  vancomycin (VANCOREADY) IVPB 750 mg/150 mL     750 mg 150 mL/hr over 60 Minutes Intravenous  Once 09/06/19 1542 09/07/19 0022   09/06/19 1540  vancomycin variable dose per unstable renal function (pharmacist dosing)      Does not apply See admin instructions 09/06/19 1542 09/08/19 2359   09/06/19 1100  ceFAZolin (ANCEF)  IVPB 2g/100 mL premix  Status:  Discontinued     2 g 200 mL/hr over 30 Minutes Intravenous 30 min pre-op 09/05/19 1717 09/06/19 1644   09/06/19 0600  ceFAZolin (ANCEF) IVPB 2g/100 mL premix  Status:  Discontinued     2 g 200 mL/hr over 30 Minutes Intravenous To Short Stay 09/06/19 0546 09/06/19 1644   09/02/19 2200  meropenem (MERREM) 1 g in sodium chloride 0.9 % 100 mL IVPB  Status:  Discontinued     1 g 200 mL/hr over 30 Minutes Intravenous Every 8 hours 09/02/19 1011 09/06/19 1542   09/02/19 2000  fluconazole (DIFLUCAN) IVPB 800 mg  Status:  Discontinued     800 mg 100 mL/hr over 240 Minutes Intravenous Every 24 hours  09/02/19 1011 09/06/19 1542   09/02/19 2000  fluconazole (DIFLUCAN) IVPB 800 mg  Status:  Discontinued     800 mg 200 mL/hr over 120 Minutes Intravenous Every 24 hours 09/02/19 1329 09/02/19 1330   09/02/19 1800  vancomycin (VANCOCIN) IVPB 1000 mg/200 mL premix  Status:  Discontinued     1,000 mg 200 mL/hr over 60 Minutes Intravenous Every 24 hours 09/02/19 1011 09/06/19 1542   09/02/19 0900  meropenem (MERREM) 1 g in sodium chloride 0.9 % 100 mL IVPB  Status:  Discontinued     1 g 200 mL/hr over 30 Minutes Intravenous Every 24 hours 09/01/19 1333 09/02/19 1011   09/01/19 1700  fluconazole (DIFLUCAN) IVPB 800 mg     800 mg 100 mL/hr over 240 Minutes Intravenous  Once 09/01/19 1645 09/01/19 2057   09/01/19 1630  fluconazole (DIFLUCAN) IVPB 400 mg  Status:  Discontinued     400 mg 100 mL/hr over 120 Minutes Intravenous  Once 09/01/19 1629 09/01/19 1645   09/01/19 1330  vancomycin variable dose per unstable renal function (pharmacist dosing)  Status:  Discontinued      Does not apply See admin instructions 09/01/19 1333 09/02/19 1325   09/01/19 0730  meropenem (MERREM) 1 g in sodium chloride 0.9 % 100 mL IVPB  Status:  Discontinued     1 g 200 mL/hr over 30 Minutes Intravenous Every 8 hours 09/01/19 0729 09/01/19 1327   08/31/19 2200  vancomycin (VANCOCIN) IVPB 1000 mg/200 mL premix  Status:  Discontinued     1,000 mg 200 mL/hr over 60 Minutes Intravenous Every 24 hours 08/30/19 2235 09/01/19 1330   08/25/19 0800  vancomycin (VANCOREADY) IVPB 1250 mg/250 mL  Status:  Discontinued     1,250 mg 166.7 mL/hr over 90 Minutes Intravenous Every 24 hours 08/24/19 2121 08/24/19 2128   08/25/19 0100  vancomycin (VANCOREADY) IVPB 1250 mg/250 mL  Status:  Discontinued     1,250 mg 166.7 mL/hr over 90 Minutes Intravenous Every 24 hours 08/24/19 2128 08/30/19 2235   08/24/19 2300  ceFEPIme (MAXIPIME) 2 g in sodium chloride 0.9 % 100 mL IVPB  Status:  Discontinued     2 g 200 mL/hr over 30 Minutes  Intravenous Every 12 hours 08/24/19 2150 09/01/19 0729   08/24/19 1338  vancomycin (VANCOCIN) 1,000 mg in sodium chloride 0.9 % 1,000 mL irrigation  Status:  Discontinued       As needed 08/24/19 1338 08/24/19 1508   08/21/19 0000  ceFEPIme (MAXIPIME) 1 g in sodium chloride 0.9 % 100 mL IVPB  Status:  Discontinued     1 g 200 mL/hr over 30 Minutes Intravenous Every 24 hours 08/20/19 0954 08/24/19 2150   08/20/19 1200  vancomycin (VANCOCIN)  IVPB 1000 mg/200 mL premix     1,000 mg 200 mL/hr over 60 Minutes Intravenous  Once 08/20/19 0954 08/21/19 0011   08/19/19 2129  vancomycin variable dose per unstable renal function (pharmacist dosing)  Status:  Discontinued      Does not apply See admin instructions 08/19/19 2129 08/24/19 2128   08/19/19 0000  ceFEPIme (MAXIPIME) 2 g in sodium chloride 0.9 % 100 mL IVPB  Status:  Discontinued     2 g 200 mL/hr over 30 Minutes Intravenous Every 24 hours 08/18/19 2351 08/20/19 0954   08/19/19 0000  vancomycin (VANCOREADY) IVPB 2000 mg/400 mL     2,000 mg 200 mL/hr over 120 Minutes Intravenous  Once 08/18/19 2351 08/19/19 0354   08/16/19 1731  vancomycin (VANCOCIN) powder  Status:  Discontinued       As needed 08/16/19 1732 08/16/19 1808   08/15/19 2045  vancomycin (VANCOCIN) IVPB 1000 mg/200 mL premix     1,000 mg 200 mL/hr over 60 Minutes Intravenous  Once 08/15/19 1402 08/15/19 2209   08/15/19 1645  cefUROXime (ZINACEF) 1.5 g in sodium chloride 0.9 % 100 mL IVPB     1.5 g 200 mL/hr over 30 Minutes Intravenous Every 12 hours 08/15/19 1402 08/17/19 0700   08/15/19 0958  vancomycin (VANCOCIN) powder  Status:  Discontinued       As needed 08/15/19 1000 08/15/19 1402   08/15/19 0400  vancomycin (VANCOREADY) IVPB 1500 mg/300 mL     1,500 mg 150 mL/hr over 120 Minutes Intravenous To Surgery 08/14/19 1121 08/15/19 0830   08/15/19 0400  cefUROXime (ZINACEF) 1.5 g in sodium chloride 0.9 % 100 mL IVPB     1.5 g 200 mL/hr over 30 Minutes Intravenous To Surgery  08/14/19 1121 08/15/19 0812   08/15/19 0400  cefUROXime (ZINACEF) 750 mg in sodium chloride 0.9 % 100 mL IVPB     750 mg 200 mL/hr over 30 Minutes Intravenous To Surgery 08/14/19 1121 08/15/19 1300      Assessment/Plan: s/p Procedure(s): EVACUATION OF HEMATOMA  No sign of fluid collection, incision in-tact, some scabbing noted near distal end where incision was under most tension. Discussed with nursing staff to change mepilex border dressing. Should be changed at least 2 times/week.  Patient being brought to progressive care.  Will continue to follow.     LOS: 50 days    Charlies Constable, PA-C 09/28/2019

## 2019-09-28 NOTE — Progress Notes (Signed)
Physical Therapy Treatment Patient Details Name: Christian [name] MRN: [id number] DOB: 12-31-1945 Today's Date: 09/28/2019    History of Present Illness 74 yo admitted with NSTEMI 4/15, RLL PE, 4/20 CABG x 4 remained open with wound VAC with repeated washouts. 4/23 cardiac arrest, 4/ 29 initiated CRRT, vent s/p trach. 5/10 additional sternal washout unable to close.  5/12 sternal plating with pectoralis flap and omental harvest to mediastinum off CRRT with iHD started. Chest wound closed. Neurology consulted for left sided weakness- CTH neg 09/08/19. 5/20 pt with emergent hematoma evacuation and transition to trach collar. 5/24 down sized to 6 trach. PMHx: BPH, CAD, DM, HLD, HTN    PT Comments    Pt with decreased attention and responsiveness today. Wife present throughout session and pt demonstrating 1-2-/5 strength all extremities. Pt was able to maintain head in midline throughout. Educated wife for [facility] and encouraged to perform with him during visits. Pt with HD access with significant bleeding this session limiting mobility. Will continue to follow and pt would benefit from Kreg tilt bed.  SpO2 98% on RA    Follow Up Recommendations  LTACH;Supervision/Assistance - 24 hour     Equipment Recommendations       Recommendations for Other Services       Precautions / Restrictions Precautions Precautions: Fall;Sternal Precaution Comments: trach, sternal flap Restrictions Weight Bearing Restrictions: Yes    Mobility  Bed Mobility Overal bed mobility: Needs Assistance Bed Mobility: Rolling Rolling: Max assist         General bed mobility comments: max assist to roll bil and change linens. Pt total +2 to slide toward HOB. Positioned bed into chair position. Unsafe to move to EOB or standing due to HD site bleeding significantly with RN aware  Transfers                    Ambulation/Gait                 Stairs             Wheelchair Mobility     Modified Rankin (Stroke Patients Only)       Balance                                            Cognition Arousal/Alertness: Lethargic Behavior During Therapy: Flat affect Overall Cognitive Status: Difficult to assess Area of Impairment: Orientation;Problem solving;Following commands;Attention                   Current Attention Level: Focused   Following Commands: Follows one step commands inconsistently       General Comments: pt with very limited responses today. Would not state wife's name however did state that his "bottom hurts" and more responses with expressive eyes and head nods. pt stating he did not sleep last night and is fatigued      Exercises General Exercises - Upper Extremity Shoulder Flexion: AAROM;Both;Seated;10 reps General Exercises - Lower Extremity Short Arc Quad: AAROM;Both;Seated;10 reps Heel Slides: AAROM;Both;10 reps;Supine Hip ABduction/ADduction: AAROM;Both;10 reps;Supine    General Comments        Pertinent Vitals/Pain Pain Assessment: Faces Pain Location: nose with movement of cortrak and sacrum with repositioning Pain Descriptors / Indicators: Discomfort;Grimacing    Home Living  Prior Function            PT Goals (current goals can now be found in the care plan section) Progress towards PT goals: Not progressing toward goals - comment(limited by lethargy and decreased participation this session)    Frequency    Min 2X/week      PT Plan Current plan remains appropriate    Co-evaluation              AM-PAC PT "6 Clicks" Mobility   Outcome Measure  Help needed turning from your back to your side while in a flat bed without using bedrails?: Total Help needed moving from lying on your back to sitting on the side of a flat bed without using bedrails?: Total Help needed moving to and from a bed to a chair (including a wheelchair)?: Total Help needed standing up  from a chair using your arms (e.g., wheelchair or bedside chair)?: Total Help needed to walk in hospital room?: Total Help needed climbing 3-5 steps with a railing? : Total 6 Click Score: 6    End of Session   Activity Tolerance: Patient limited by lethargy Patient left: in bed;with nursing/sitter in room;with family/visitor present Nurse Communication: Mobility status;Need for lift equipment PT Visit Diagnosis: Other abnormalities of gait and mobility (R26.89);Muscle weakness (generalized) (M62.81);Other symptoms and signs involving the nervous system (R29.898)     Time: 0932-3557 PT Time Calculation (min) (ACUTE ONLY): 41 min  Charges:  $Therapeutic Exercise: 8-22 mins $Therapeutic Activity: 23-37 mins                     Raigan Baria P, PT Acute Rehabilitation Services Pager: [phone] Office: [phone]    Oumou Smead B Sivan Cuello 09/28/2019, 11:14 AM

## 2019-09-28 NOTE — Progress Notes (Addendum)
      [address]       [place] [address]             [phone]      14 Days Post-Op Procedure(s) (LRB): EVACUATION OF HEMATOMA (N/A)   Subjective:  Patient state not doing very well.  Patient didn't voice specifics.  However nursing stating he needs to pee but has been having difficulty.  He was bladder scanned and there was 300 cc of urine in his bladder.  Objective: Vital signs in last 24 hours: Temp:  [98.3 F (36.8 C)-98.8 F (37.1 C)] 98.8 F (37.1 C) (06/03 0735) Pulse Rate:  [101-104] 104 (06/03 0735) Cardiac Rhythm: Normal sinus rhythm (06/03 0400) Resp:  [14-22] 19 (06/03 0735) BP: (109-154)/(60-74) 142/60 (06/03 0735) SpO2:  [95 %-100 %] 97 % (06/03 0735) FiO2 (%):  [21 %] 21 % (06/03 0421) Weight:  [85.4 kg] 85.4 kg (06/03 0354)  Intake/Output from previous day: 06/02 0701 - 06/03 0700 In: 1475 [NG/GT:1475] Out: 0   General appearance: cooperative and no distress Heart: regular rate and rhythm Lungs: clear to auscultation bilaterally Abdomen: soft, non-tender; bowel sounds normal; no masses,  no organomegaly Extremities: extremities normal, atraumatic, no cyanosis or edema Wound: clean and dry  Lab Results: Recent Labs    09/26/19 1116  WBC 18.5*  HGB 8.7*  HCT 27.1*  PLT 338   BMET:  Recent Labs    09/27/19 0219 09/28/19 0445  NA 133* 134*  K 4.0 4.3  CL 95* 95*  CO2 25 23  GLUCOSE 174* 147*  BUN 85* 142*  CREATININE 4.27* 6.08*  CALCIUM 8.8* 8.9    PT/INR: No results for input(s): LABPROT, INR in the last 72 hours. ABG    Component Value Date/Time   PHART 7.481 (H) 09/07/2019 0029   HCO3 23.3 09/07/2019 0029   TCO2 24 09/07/2019 0029   ACIDBASEDEF 5.0 (H) 08/18/2019 2341   O2SAT 99.0 09/07/2019 0029   CBG (last 3)  Recent Labs    09/27/19 2010 09/28/19 0013 09/28/19 0355  GLUCAP 198* 171* 156*    Assessment/Plan: S/P Procedure(s) (LRB): EVACUATION OF HEMATOMA (N/A)  1. CV- hemodynamically  stable 2. Pulm- trach collar as tolerated, not currently on oxygen 3. Renal- ESRD, dialysis per Nephrology, patient feels like he has to void but is unable.... if patient can void, will culture to ensure no infection, however I suspect he makes little to no urine 4. DM- sugars controlled 5. Deconditioning- no LTAC beds available, patient for SNF... bed choices provided, insurance auth pending 6. Dispo- patient stable, will clarify feeding with Dr. Orvan Seen... still has cortrak in place, ? Feeding tube?, continue to use PMV as tolerated, dialysis per renal, ready for d/c once SNF arranged   Spoke with Dr. Orvan Seen- will plan to place gastrostomy tube, once in place can remove cortrak.... question if patient can be decannulated soon, will see what SLP recommends.    LOS: 50 days    [name], PA-C  09/28/2019

## 2019-09-28 NOTE — Progress Notes (Signed)
PA to bedside due to concern for bleeding catheter.  Catheter undressed.  Large amount of clot surrounding catheter with very slow oozing.  Held pressure for several minutes.  No oozing noted.  Cleaned catheter.  [employer] and redressed. Will return tomorrow to assess.  Brynda Greathouse, MS RD PA-C

## 2019-09-28 NOTE — Progress Notes (Signed)
RN, RT along with [facility] agreed to wait to decannulate pt until 6/4 first thing in AM due to pt being in HD until 17:00 today

## 2019-09-28 NOTE — Progress Notes (Signed)
OT Cancellation Note  Patient Details Name: Christian [name] MRN: [id number] DOB: 11-26-1945   Cancelled Treatment:    Reason Eval/Treat Not Completed: Other (comment)(Pt at HD at this time. Will follow up as available and appropriate)  Zenovia Jarred, MSOT, OTR/L [facility] Office Number: [phone] Pager: [phone]  Zenovia Jarred 09/28/2019, 1:26 PM

## 2019-09-29 ENCOUNTER — Encounter (HOSPITAL_COMMUNITY): Payer: Self-pay | Admitting: Cardiothoracic Surgery

## 2019-09-29 ENCOUNTER — Inpatient Hospital Stay (HOSPITAL_COMMUNITY): Payer: Medicare Other

## 2019-09-29 HISTORY — PX: IR PATIENT EVAL TECH 0-60 MINS: IMG5564

## 2019-09-29 LAB — GLUCOSE, CAPILLARY
Glucose-Capillary: 177 mg/dL — ABNORMAL HIGH (ref 70–99)
Glucose-Capillary: 154 mg/dL — ABNORMAL HIGH (ref 70–99)
Glucose-Capillary: 142 mg/dL — ABNORMAL HIGH (ref 70–99)
Glucose-Capillary: 135 mg/dL — ABNORMAL HIGH (ref 70–99)
Glucose-Capillary: 194 mg/dL — ABNORMAL HIGH (ref 70–99)

## 2019-09-29 LAB — RENAL FUNCTION PANEL
Albumin: 2.4 g/dL — ABNORMAL LOW (ref 3.5–5.0)
Anion gap: 16 — ABNORMAL HIGH (ref 5–15)
BUN: 91 mg/dL — ABNORMAL HIGH (ref 8–23)
CO2: 24 mmol/L (ref 22–32)
Calcium: 9 mg/dL (ref 8.9–10.3)
Chloride: 93 mmol/L — ABNORMAL LOW (ref 98–111)
Creatinine, Ser: 4.77 mg/dL — ABNORMAL HIGH (ref 0.61–1.24)
GFR calc Af Amer: 13 mL/min — ABNORMAL LOW (ref >60)
GFR calc non Af Amer: 11 mL/min — ABNORMAL LOW (ref >60)
Glucose, Bld: 175 mg/dL — ABNORMAL HIGH (ref 70–99)
Phosphorus: 4.8 mg/dL — ABNORMAL HIGH (ref 2.5–4.6)
Potassium: 4.5 mmol/L (ref 3.5–5.1)
Sodium: 133 mmol/L — ABNORMAL LOW (ref 135–145)

## 2019-09-29 LAB — MAGNESIUM: Magnesium: 2.5 mg/dL — ABNORMAL HIGH (ref 1.7–2.4)

## 2019-09-29 LAB — CBC
HCT: 26.3 % — ABNORMAL LOW (ref 39.0–52.0)
Hemoglobin: 8.2 g/dL — ABNORMAL LOW (ref 13.0–17.0)
MCH: 30.8 pg (ref 26.0–34.0)
MCHC: 31.2 g/dL (ref 30.0–36.0)
MCV: 98.9 fL (ref 80.0–100.0)
Platelets: 393 10*3/uL (ref 150–400)
RBC: 2.66 MIL/uL — ABNORMAL LOW (ref 4.22–5.81)
RDW: 17.9 % — ABNORMAL HIGH (ref 11.5–15.5)
WBC: 19.9 10*3/uL — ABNORMAL HIGH (ref 4.0–10.5)
nRBC: 0 % (ref 0.0–0.2)

## 2019-09-29 MED ORDER — CHLORHEXIDINE GLUCONATE 4 % EX LIQD
CUTANEOUS | Status: AC
  Filled 2019-09-29: qty 15

## 2019-09-29 MED ORDER — OXYCODONE HCL 5 MG PO TABS
5.0000 mg | ORAL_TABLET | Freq: Four times a day (QID) | ORAL | Status: DC | PRN
  Administered 2019-10-01 – 2019-10-11 (×11): 5 mg
  Filled 2019-09-29 (×11): qty 1

## 2019-09-29 MED ORDER — LIDOCAINE HCL 1 % IJ SOLN
INTRAMUSCULAR | Status: AC
  Filled 2019-09-29: qty 20

## 2019-09-29 MED ORDER — CHLORHEXIDINE GLUCONATE CLOTH 2 % EX PADS
6.0000 | MEDICATED_PAD | Freq: Every day | CUTANEOUS | Status: DC
  Administered 2019-09-29 – 2019-10-06 (×8): 6 via TOPICAL

## 2019-09-29 MED ORDER — AMIODARONE HCL 200 MG PO TABS
200.0000 mg | ORAL_TABLET | Freq: Every day | ORAL | Status: DC
  Administered 2019-09-30 – 2019-10-13 (×9): 200 mg
  Filled 2019-09-29 (×12): qty 1

## 2019-09-29 MED ORDER — GELATIN ABSORBABLE 12-7 MM EX MISC
CUTANEOUS | Status: AC
  Filled 2019-09-29: qty 1

## 2019-09-29 NOTE — Plan of Care (Signed)
  Problem: Education: Goal: Knowledge of General Education information will improve Description: Including pain rating scale, medication(s)/side effects and non-pharmacologic comfort measures Outcome: Progressing   Problem: Clinical Measurements: Goal: Ability to maintain clinical measurements within normal limits will improve Outcome: Progressing   Problem: Coping: Goal: Level of anxiety will decrease Outcome: Progressing   Problem: Safety: Goal: Ability to remain free from injury will improve Outcome: Progressing   Problem: Skin Integrity: Goal: Risk for impaired skin integrity will decrease Outcome: Progressing   Problem: Education: Goal: Will demonstrate proper wound care and an understanding of methods to prevent future damage Outcome: Progressing   Problem: Education: Goal: Knowledge of disease or condition will improve Outcome: Progressing   Problem: Education: Goal: Knowledge of the prescribed therapeutic regimen will improve Outcome: Progressing   Problem: Cardiac: Goal: Will achieve and/or maintain hemodynamic stability Outcome: Progressing   Problem: Clinical Measurements: Goal: Postoperative complications will be avoided or minimized Outcome: Progressing   Problem: Skin Integrity: Goal: Wound healing without signs and symptoms of infection Outcome: Progressing   Problem: Skin Integrity: Goal: Risk for impaired skin integrity will decrease Outcome: Progressing

## 2019-09-29 NOTE — Progress Notes (Addendum)
NAME:  Christian [name], MRN:  [id number], DOB:  1945/05/29, LOS: 105 ADMISSION DATE:  08/09/2019, CONSULTATION DATE:  08/28/2019 REFERRING MD:  Orvan Seen - TRH, CHIEF COMPLAINT:  Respiratory failure.    HPI/course in hospital   74 year old man who presented with progressive dyspnea.  He was admitted 4/14 and was incidentally found to have a right lower lobe pulmonary embolism.  Overall the picture was that of decompensated heart failure with a non-STEMI.  Coronary angiography revealed triple-vessel disease and the patient was referred for cardiac surgery.  EF was found to be reduced at that time.  He underwent four-vessel CABG 4/20 with good quality distal targets.  Cardiac function remained reduced post bypass.  Unable to close chest due to surrounding edema.  Underwent attempted closure with VAC placement 4/22.   Cardiac arrest 4/23 with repeated washouts with VAC placements on 4/26 and 4/29.  Significant volume overload postoperatively.  Acute kidney injury.  Transitioned from CRRT for fluid removal to iHD on 5/13.  Sig Events: 08/09/19 Admitted with NSTEM and ? Small RLL PE. Weight 213 pounds.  08/15/19 CABG x4 on 08/15/19 08/18/19 Cardiac arrest due to tamponade. Chest opened for washout 4/26 Chest washout in OR 4/29 CVVH started 5/5 OR for chest washout, tracheostomy 5/6 Trach aspirate with pan sensitive pseudomonas (completed course of abx  5/10 repeat washout, unable to close 5/12 to OR for chest closure with muscle flap  5/13 transitioned to [facility] 5/14 Neurology consulted for left sided weakness- CTH neg 5/20 chest wall swelling > taken to OR for evacuation of hematoma and re-closure.  5/24 down sized to 6 trach 5/28 down sized to 4 cuffless trach  6/01 Trach capped and tolerating well   Micro: 4/14 SARS/ Flu A/B >> neg 4/19 MRSA PCR >> neg 4/22 BCx 2 >> neg 5/2 trach asp >> normal flora  5/2 BCx2 >> neg 5/6 trach asp >> pan sensitive pseudomonas  5/6 BCx2 >> neg 5/10  sternal wound cx >> neg 5/14 BCx 2 >> ngtd  5/14 trach asp >> normal flora   ABX: Cefepime  4/23 >> 5/6 vanc 4/20 >> 4/25; 4/29 >>5/4; 5/6 >>5/13 Fluconazole 5/7 >>5/14 Meropenem 5/7 >> 5/15  Imaging: CTA PE 4/14 >> small RLL PE Echo 4/24: EF 45% with Grade 2 DD [facility] 5/13 and 5/15 >> chronic microvascular ischemia, no acute intracranial abnormality  CT chest 5/24: BILATERAL pleural effusions and scattered atelectasis greatest in LEFT lower lobe.Pleural calcifications in hemithoraces bilaterally question asbestos exposure.Stranding of the anterior mediastinum likely related to prior cardiac surgery and sternal dehiscence, with herniation of fat through the sternotomy.Diffuse stranding along the RIGHT pectoralis major and minor muscles which could be related to prior surgery, hemorrhage, or infection. 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  LDA: L [place] temporary HD line 5/7 >> 5/25 R midline PIV 5/6 >> out shiley trach 5/5 >> cortrak left nare 5/3  >> Rectal tube >> 6/1 Wound vac over sternum >> 5/26 L IJ [facility] 5/25 >>  Interim history/subjective:  Lying in bed in no acute distress, states he feels well. S  Objective   Blood pressure 124/73, pulse (!) 108, temperature 98.5 F (36.9 C), temperature source Oral, resp. rate 19, height 5\' 9"  (1.753 m), weight 85.4 kg, SpO2 99 %.    FiO2 (%):  [21 %] 21 %   Intake/Output Summary (Last 24 hours) at 09/29/2019 1105 Last data filed at 09/28/2019 2014 Gross per 24 hour  Intake 540 ml  Output 2450 ml  Net -1910 ml   Filed Weights   09/26/19 1453 09/27/19 0500 09/28/19 0354  Weight: 86.8 kg 85.9 kg 85.4 kg    Examination:  General: Chronically ill appearing elderly deconditioned male lying in bed in NAD HEENT: 4 Shiley trach capped, MM pink/moist, PERRL,  Neuro: Alert and oriented x3, non-focal, weak  CV: s1s2 regular rate and rhythm, no murmur, rubs, or gallops,  PULM:  Diminished bilaterally, no added breath, no increased work of breathing    GI: soft, bowel sounds active in all 4 quadrants, non-tender, non-distended, tolerating TF Extremities: warm/dry, no edema  Skin: no rashes or lesions, sternal dressing clean dry and intact  Resolved   Cardiac tamponade on 4/23 -Status post open chest for washout and clot removal. 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   Restrictive chest physiology status post sternotomy, poor chest wall compliance.  -Baseline PFTs with mixed restrictive and obstructive defect, FEV1 50% predicted prior to surgery. - down sized to 4 cuffless shiley 5/28 P:  Continues to tolerate capping trials Hold on decannulation due to profound deconditioning  SLP following Continue PT/OT efforts  Routine trach care Continue nebs Continue to encourage pulmonary hygiene  Remains on RA   Additional medical issues managed by primary : -ATN now HD dependent, dysphagia, mediastinal hematoma post washout, A-Fib Awaiting LTAC placement.  Will see again tomorrow.     Johnsie Cancel, NP-C [facility] Contact / Pager information can be found on Amion  09/29/2019, 11:09 AM

## 2019-09-29 NOTE — Progress Notes (Signed)
I was notified by primary RN Lelon Frohlich about HD bleeding. I assessed the site and and saw a moderate amount of bleeding down the armpit and on gown with a large clot formed under the dressing. I viewed notes of Brynda Greathouse PA with IR who came and put as quickclot dressing on it this evening. I notified Dr.McCullough with IR for tonight and made him aware he suggested changing the dressing and reinforce with pressure dressing. The primary RN Lelon Frohlich and myself did what he suggested however  we did not remove the quickclot . Lelon Frohlich will continue to monitor. Patient stable with BP 138/77 HR 108. The plans is for IR to assess again tomorrow.

## 2019-09-29 NOTE — Progress Notes (Signed)
Decannulation on hold at this time due to pt unknown source of bleeding.

## 2019-09-29 NOTE — Progress Notes (Signed)
Removed 3 staples per Erin Barrett  verbal orders, Pt tolerated procedure fairly well.  Junie Panning advised to removed sutures as well, will remove later today, per the request of the Pt.Marland Kitchen

## 2019-09-29 NOTE — Progress Notes (Signed)
While gving patient his night medications patient spoke up and stated he does not want to take any more medications. I encouraged patient to try to tolerate the ones going down his coretrak and those are the ones he doesn't want to take. He stated his nose is so sore and the medications bother his stomach. Medications were given and instructed patient to talk the his MD in the morning. Will pass this information on to the day RN. Also, patients tunneled HD cath is bleeding and Karlene charge RN tonight was made aware of the amount of blood oozing from the site. She called the IR MD tonight and they are aware and will reassess patient in the morning. Karlene and I placed a pressure dressing over the site. Will continue to monitor the patient closely. Vital signs stable.

## 2019-09-29 NOTE — Progress Notes (Addendum)
Physician notified: IR team At: [phone]  Regarding: pt still having HD cath bleeding noted upon shift report. Dressing saturated, blood stop patch noted but also saturated, under dressing blood appears gel like consistency but liquid dark red blood oozing from attempted pressure dressing. RN x2 cleaned patient and bed soiled from HD cath site. Bedside RN attempted to reinforce dressing to slow ooze. Renal ordered CBC, also wants IR to assess cath site. Dr. Orvan Seen aware- appreciates IR input for procedure site. Awaiting return response.   Physician notified: IR team At: 0902  Regarding: re paged for same issue. Awaiting return response.   Returned Response at: 0905 IR will plan to get patient within the next few hours to place stitch. Per Tommi Rumps in IR  Bedside RN will continue to monitor pt.

## 2019-09-29 NOTE — Progress Notes (Signed)
PA was able to meet with patient and wife at bedside this afternoon.  They indicate they would like to proceed with gastrostomy tube placement.  Wife requests no placement on Monday.  Will work towards possible placement early next week.   Risks and benefits image guided gastrostomy tube placement was discussed with the patient including, but not limited to the need for a barium enema during the procedure, bleeding, infection, peritonitis and/or damage to adjacent structures.  All of the patient's questions were answered, patient is agreeable to proceed.  Consent signed and in chart.  Brynda Greathouse, MS RD PA-C

## 2019-09-29 NOTE — Progress Notes (Signed)
      [address]       [place] [address]             [phone]      15 Days Post-Op Procedure(s) (LRB): EVACUATION OF HEMATOMA (N/A)   Subjective:  Patient is tired of taking medications.  He is having bleeding around his dialysis site.  He has not yet been decannulated yet.   Objective: Vital signs in last 24 hours: Temp:  [97.9 F (36.6 C)-99.9 F (37.7 C)] 99.6 F (37.6 C) (06/04 0400) Pulse Rate:  [101-111] 107 (06/04 0400) Cardiac Rhythm: Sinus tachycardia (06/04 0719) Resp:  [14-23] 20 (06/04 0400) BP: (102-163)/(59-77) 148/66 (06/04 0400) SpO2:  [96 %-99 %] 98 % (06/04 0400) FiO2 (%):  [21 %] 21 % (06/04 0500)  Intake/Output from previous day: 06/03 0701 - 06/04 0700 In: 540 [NG/GT:540] Out: 2450 [Urine:350]  General appearance: alert, cooperative and no distress Heart: regular rate and rhythm Lungs: clear to auscultation bilaterally Abdomen: soft, non-tender; bowel sounds normal; no masses,  no organomegaly Extremities: edema trace Wound: clean and dry  Lab Results: Recent Labs    09/26/19 1116  WBC 18.5*  HGB 8.7*  HCT 27.1*  PLT 338   BMET:  Recent Labs    09/28/19 0445 09/29/19 0247  NA 134* 133*  K 4.3 4.5  CL 95* 93*  CO2 23 24  GLUCOSE 147* 175*  BUN 142* 91*  CREATININE 6.08* 4.77*  CALCIUM 8.9 9.0    PT/INR: No results for input(s): LABPROT, INR in the last 72 hours. ABG    Component Value Date/Time   PHART 7.481 (H) 09/07/2019 0029   HCO3 23.3 09/07/2019 0029   TCO2 24 09/07/2019 0029   ACIDBASEDEF 5.0 (H) 08/18/2019 2341   O2SAT 99.0 09/07/2019 0029   CBG (last 3)  Recent Labs    09/28/19 2003 09/28/19 2345 09/29/19 0433  GLUCAP 226* 179* 177*    Assessment/Plan: S/P Procedure(s) (LRB): EVACUATION OF HEMATOMA (N/A)  1. CV-hemodynamically stable- will decrease Amiodarone to 200 mg daily, Midodrine, will aim to stop Amiodarone soon 2. Pulm- plan to decannulate trach today, stable off oxygen,  no acute issues 3. Renal- ESRD- dialysis per nephrology, bleeding around HD catheter.. IR has been contacted, patient is making small amount of urine... ?vascular surgery consult for permanent dialysis access? 4. NPO- patient has order for feeding tube to be placed, unsure when this will be done will speak with IR today... needs this placed to remove cortrak and perform swallow study 5. ID- mild dehiscence of left radial artery site, diligent wound care will likely require some bedside debridement will speak with Dr. Orvan Seen 6. Deconditioning- for SNF.Marland Kitchen need to get feeding tube placed and bleeding around dialysis access stopped, 7. Dispo- patient awaiting feeding tube placement, need to control bleeding around HD catheter, will discuss need for vascular surgery consult for placement of permanent catheter with Dr. Orvan Seen, wound care to wound dehiscence from left radial, patient not ready for SNF today   LOS: 51 days    Christian Handler, PA-C  09/29/2019

## 2019-09-29 NOTE — Progress Notes (Signed)
SLP Cancellation Note  Patient Details Name: Christian [name] MRN: [id number] DOB: 1945-07-03   Cancelled treatment:       Reason Eval/Treat Not Completed: Other (comment)(RN reported that the pt is currently bleeding and is scheduled to go to IR. Per RN, decannulation is planned for today. SLP will follow up next week for instrumental swallow assessment.)  Vanita Cannell I. Hardin Negus, [place], [place] Office number [phone] Pager [place] 09/29/2019, 9:35 AM

## 2019-09-29 NOTE — Consult Note (Signed)
[place] Nurse Consult Note: Patient receiving care in 2C09.  Assisted with turning by primary RN. Reason for Consult: buttock and LFA wound Wound type: buttocks have small, partial thickness, scattered areas impacted by fecal incontinence.  Gerhardt's butt cream is in use and doing a fine job helping to heal the areas.  Continue the use as currently ordered.  I have added an air mattress (low air loss feature) to promote pressure reduction and control of microclimate.  The left forearm has a surgical site wound that measures 5.5 cm x 0.7 cm.  The edges of this wound are somewhat discolored (darkened).  There is no drainage, no odor, and the shallow (approximately 0.2 cm depth) wound bed is pink and dry.  For this I have ordered: Apply iodine from the swab sticks or swab pads to the LFA wound. Allow to air dry. Place a piece of dry gauze over the area, secure with a few turns of kerlex.   Pressure Injury POA: Yes/No/NA Measurement: Wound bed: Drainage (amount, consistency, odor)  Periwound: Dressing procedure/placement/frequency: Monitor the wound area(s) for worsening of condition such as: Signs/symptoms of infection,  Increase in size,  Development of or worsening of odor, Development of pain, or increased pain at the affected locations.  Notify the medical team if any of these develop.  Thank you for the consult.  Discussed plan of care with the patient and bedside nurse.  [place] nurse will not follow at this time.  Please re-consult the [place] team if needed.  Val Riles, RN, MSN, CWOCN, CNS-BC, pager [phone]

## 2019-09-29 NOTE — Progress Notes (Signed)
IR notified of patient with ongoing bleeding from his catheter site.   Patient brought to IR. Held pressure for 10 minutes with no oozing noted.  Injected trach with gel foam and held pressure for an additional 10 minutes.  No oozing noted after observation for 5 minutes.  Dressed catheter.   G-tube consult to follow.   Brynda Greathouse, MS RD PA-C 10:50 AM

## 2019-09-29 NOTE — Progress Notes (Signed)
OT Cancellation Note  Patient Details Name: BROWN DUNLAP MRN: [id number] DOB: [birth year]   Cancelled Treatment:    Reason Eval/Treat Not Completed: Other (comment)(Pt getting HD catheter looked at at time OT attempted to treat. Will continue to follow as pt available)  Zenovia Jarred, MSOT, OTR/L [place] [facility] Office Number: [phone] Pager: [phone]  Zenovia Jarred 09/29/2019, 1:21 PM

## 2019-09-29 NOTE — Progress Notes (Signed)
[facility] NEPHROLOGY PROGRESS NOTE  Assessment/ Plan: Pt is a 74 y.o. yo male is status post CABG and had a cardiac arrest due to tamponade, developed AKI requiring dialysis.  Previously the creatinine level was 1.22 in 06/2019.  #Acute kidney injury following cardiac cath, CABG, cardiogenic shock and cardiac arrest.  Started CRRT from 4/29-5/12 to manage volume.  He is now tolerating intermittent hemodialysis.   No sign of renal recovery so far. He will need arrangement for OP HD for AKI, trach is capped now.  Discussed with renal navigator. Status post right IJ TDC by IR on 5/25.  Noted he had bleeding from the catheter site yesterday and was seen by IR.  Currently the bleeding has subsided and has gauze applied.  IR is following.  No permanent access because of AKI. Plan for next HD tomorrow.  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  # Postoperative acute hypoxic respiratory failure on h/o COPD- reintubated 08/18/19 and likely aspiration.  Clinically doing well and now the tracheostomy is capped.  # Anemia of critical illness: Iron saturation 14% therefore starting IV iron with dialysis.  Continue ESA.  Checking CBC today as he was bleeding from catheter site.  #CKD-MBD: Continue Renvela.  Phosphorus at goal.  # A fib per primary.  #Dispo - LTACH planned. Plan for gastric feeding tube placement noted.  Subjective: Seen and examined at bedside.  He had bleeding and clot around the catheter site yesterday.  IR was contacted and was evaluated.  No active bleeding at the moment.  He had dialysis yesterday with 2 L UF.  Objective Vital signs in last 24 hours: Vitals:   09/28/19 2131 09/28/19 2346 09/29/19 0400 09/29/19 0834  BP:  138/77 (!) 148/66  128/73  Pulse: (!) 107 (!) 106 (!) 107 (!) 105  Resp: (!) 22 20 20 20   Temp:  99.9 F (37.7 C) 99.6 F (37.6 C) 98.5 F (36.9 C)  TempSrc:  Oral Oral Oral  SpO2:  99% 98% 99%  Weight:      Height:       Weight change:   Intake/Output Summary (Last 24 hours) at 09/29/2019 0903 Last data filed at 09/28/2019 2014 Gross per 24 hour  Intake 540 ml  Output 2450 ml  Net -1910 ml       Labs: Basic Metabolic Panel: Recent Labs  Lab 09/27/19 0219 09/28/19 0445 09/29/19 0247  NA 133* 134* 133*  K 4.0 4.3 4.5  CL 95* 95* 93*  CO2 25 23 24   GLUCOSE 174* 147* 175*  BUN 85* 142* 91*  CREATININE 4.27* 6.08* 4.77*  CALCIUM 8.8* 8.9 9.0  PHOS 4.5 5.3* 4.8*   Liver Function Tests: Recent Labs  Lab 09/27/19 0219 09/28/19 0445 09/29/19 0247  ALBUMIN 2.4* 2.4* 2.4*   No results for input(s): LIPASE, AMYLASE in the last 168 hours. No results for input(s): AMMONIA in the last 168 hours. CBC: Recent Labs  Lab 09/23/19 0238 09/23/19 0238 09/24/19 0234 09/24/19 0234 09/25/19 0229 09/26/19 1116 09/29/19 0756  WBC 15.1*   < > 14.9*   < > 15.7* 18.5* 19.9*  HGB 9.2*   < > 9.1*   < > 8.7* 8.7* 8.2*  HCT 28.9*   < > 28.9*   < > 27.2*  27.1* 26.3*  MCV 99.7  --  98.6  --  98.2 98.5 98.9  PLT 364   < > 354   < > 300 338 393   < > = values in this interval not displayed.   Cardiac Enzymes: No results for input(s): CKTOTAL, CKMB, CKMBINDEX, TROPONINI in the last 168 hours. CBG: Recent Labs  Lab 09/28/19 1112 09/28/19 1754 09/28/19 2003 09/28/19 2345 09/29/19 0433  GLUCAP 196* 147* 226* 179* 177*    Iron Studies:  No results for input(s): IRON, TIBC, TRANSFERRIN, FERRITIN in the last 72 hours. Studies/Results: CT ABDOMEN WO CONTRAST  Result Date: 09/28/2019 CLINICAL DATA:  Evaluation for possible percutaneous gastrostomy tube placement. EXAM: CT ABDOMEN WITHOUT CONTRAST TECHNIQUE: Multidetector CT imaging of the abdomen was performed following the standard protocol without  IV contrast. COMPARISON:  CT of the chest on 09/18/2019 FINDINGS: Lower chest: Atelectasis at the posterior left lung base. Hepatobiliary: No focal liver abnormality is seen. No gallstones, gallbladder wall thickening, or biliary dilatation. Pancreas: Unremarkable. No pancreatic ductal dilatation or surrounding inflammatory changes. Spleen: Normal in size without focal abnormality. Adrenals/Urinary Tract: Stable hyperplastic and nodular appearance of the adrenal glands bilaterally. Cyst of the lateral mid left kidney demonstrates internal simple fluid density and measures approximately 2.6 cm. No hydronephrosis or renal calculi identified. Stomach/Bowel: No evidence of hiatal hernia. Feeding tube in place that extends into the proximal jejunum. Gastric positioning normal with some of the stomach located posterior to the left lobe of the liver when decompressed. The stomach does lie superior to the transverse colon. No evidence of bowel obstruction, ileus, inflammation or free air. Vascular/Lymphatic: No significant vascular findings are present. No enlarged abdominal lymph nodes. Other: No ascites, body wall edema or focal fluid collections. No focal hernias. Musculoskeletal: No acute or significant osseous findings. IMPRESSION: 1. Gastric positioning normal with some of the stomach located posterior to the left lobe of the liver when decompressed. There would likely be an adequate window for percutaneous gastrostomy tube placement after air insufflation of the stomach. 2. Stable hyperplastic and nodular appearance of the adrenal glands bilaterally. 3. Atelectasis at the posterior left lung base. Electronically Signed   By: Aletta Edouard M.D.   On: 09/28/2019 16:50    Medications: Infusions: . sodium chloride Stopped (09/14/19 0802)  . feeding supplement (NEPRO CARB STEADY) 45 mL/hr at 09/29/19 0400  . ferric gluconate (FERRLECIT/NULECIT) IV 125 mg (09/26/19 1155)  . lactated ringers 20 mL/hr at 08/20/19  1200    Scheduled Medications: .  stroke: mapping our early stages of recovery book   Does not apply Once  . alteplase  2 mg Intracatheter Once  . amiodarone  200 mg Per Tube Daily  . arformoterol  15 mcg Nebulization BID  . aspirin  81 mg Per Tube Daily  . atorvastatin  40 mg Per Tube Daily  . bisacodyl  10 mg Rectal Daily  . chlorhexidine  15 mL Mouth Rinse BID  . Chlorhexidine Gluconate Cloth  6 each Topical Q0600  . Chlorhexidine Gluconate Cloth  6 each Topical Q0600  . Chlorhexidine Gluconate Cloth  6 each Topical Q0600  . clonazePAM  0.5 mg Per Tube BID  . darbepoetin (ARANESP) injection - DIALYSIS  100 mcg Intravenous Q Thu-HD  . feeding supplement (PRO-STAT SUGAR FREE 64)  60 mL Per Tube TID  . insulin aspart  0-15 Units Subcutaneous Q4H  . insulin detemir  10 Units Subcutaneous BID  . mouth rinse  15 mL Mouth Rinse  [id number]  . midodrine  5 mg Per Tube TID WC  . multivitamin  1 tablet Oral QHS  . pantoprazole sodium  40 mg Per Tube Daily  . revefenacin  175 mcg Nebulization Daily  . sertraline  25 mg Per Tube Daily  . sevelamer carbonate  0.8 g Per Tube TID WC  . trospium  20 mg Per Tube BID    have reviewed scheduled and prn medications.  Physical Exam: General:NAD, lying on bed with tracheostomy capped. Heart:RRR, s1s2 nl Lungs: Basal rhonchi., no wheezing Abdomen:soft, Non-tender, non-distended Extremities:No edema Dialysis Access: Right IJ TDC, dry blood and of multiple causes around the catheter site.  No active bleeding noted.  Velva Molinari Tanna Furry 09/29/2019,9:03 AM  LOS: 51 days  Pager: [phone]

## 2019-09-29 NOTE — Progress Notes (Signed)
Pt returned from IR, site clean dry, does not appear to be bleeding at this time.

## 2019-09-29 NOTE — Progress Notes (Signed)
SLP Note  Patient Details Name: Christian [name] MRN: [id number] DOB: 04/13/1946   SLP Treatment Plan Update: Pt's case was discussed with Christian Fee, NP, and she indicated that MD would like to defer decannulation until pt is less deconditioned and ambulatory. G-tube is currently being considered by family. SLP will plan to conduct MBS on 09/30/19, pending radiology's availability, to determine if G-tube is necessary at this time or whether a p.o. diet may be initiated. Whitney, NP has been advised of this plan.   [name] I. Hardin Negus, [place], [place] Office number [phone] Pager [place] 09/29/2019, 2:46 PM

## 2019-09-29 NOTE — Consult Note (Signed)
Chief Complaint: Patient was seen in consultation today for dysphagia  Referring Physician(s): Dr. Orvan Seen  Supervising Physician: Arne Cleveland  Patient Status: [facility] - In-pt  History of Present Illness: Christian [name] is a 74 y.o. male with history of decompensated heart failure and NSTEMI s/p CABG x4 12/28/38 complicated by wound closure difficulty with VAC placement 4/22, cardiac arrest 4/23, s/p washouts with VAC replacements 4/26/ and 4/29. Patient has been maintained on tube feeding for nutrition via NGT due to trach/airway status as well as deconditioning.  IR consulted for percutaneous gastrostomy tube placement.   CT Abdomen shows anatomy is likely favorable with insufflation of the stomach at the time of placement. Approved for percutaneous attempt by Dr. Kathlene Cote.   Past Medical History:  Diagnosis Date  . BPH (benign prostatic hypertrophy)   . Coronary artery disease   . Diabetes mellitus without complication ([place])   . Dry eyes left  . Hiatal hernia   . 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  LIMA TO LAD, RIMA TO PDA, RADIAL ARTERY TO CIRC AND SEQUENTIALLY TO OM1. 08/15/2019  . Hyperlipidemia   . Hypertension   . Incomplete bladder emptying   . Nocturia   . 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 SOB (shortness of breath) on exertion     Past Surgical History:  Procedure Laterality Date  . APPLICATION OF WOUND VAC N/A 08/24/2019   Procedure: APPLICATION OF WOUND VAC;  Surgeon: [name], MD;  Location: [place];  Service: Thoracic;  Laterality: N/A;  . APPLICATION OF WOUND VAC  08/29/2019   Procedure: Wound Vac change;  Surgeon: [name], MD;  Location: [place] [facility];  Service: Open Heart Surgery;;  . APPLICATION OF WOUND VAC N/A 09/04/2019   Procedure: Application Of Wound Vac;  Surgeon: Grace Armon, MD;   Location: [place];  Service: Open Heart Surgery;  Laterality: N/A;  . APPLICATION OF WOUND VAC N/A 09/06/2019   Procedure: APPLICATION OF ACELL, APPLICATION OF WOUND VAC USING PREVENA INCISIONAL  DRESSING;  Surgeon: Wallace Going, DO;  Location: [place];  Service: Plastics;  Laterality: N/A;  . CARDIAC CATHETERIZATION    . CORONARY ARTERY BYPASS GRAFT N/A 08/15/2019   Procedure: CORONARY ARTERY BYPASS GRAFTING (CABG), x 4 using bilateral IMAs and left radial artery .  LIMA TO LAD, RIMA TO PDA, RADIAL ARTERY TO CIRC AND SEQUENTIALLY TO OM1.;  Surgeon: [name], MD;  Location: [place];  Service: Open Heart Surgery;  Laterality: N/A;  . CORONARY STENT PLACEMENT  02/27/2014   distal rt/pd coronary       dr Einar Gip  . CYSTO/ BLADDER BIOPSY'S/ CAUTHERIZATION  01-14-2004  DR Gaynelle Arabian  . EXPLORATION POST OPERATIVE OPEN HEART N/A 08/16/2019   Procedure: Chest Closure S?P CABG WITH APPLICATION OF PREVENA  INCISIONAL WOUND VAC;  Surgeon: [name], MD;  Location: [facility];  Service: Open Heart Surgery;  Laterality: N/A;  . EXPLORATION POST OPERATIVE OPEN HEART N/A 08/21/2019   Procedure: CHEST WASHOUT S/P OPEN CHEST;  Surgeon: [name], MD;  Location: [place];  Service: Open Heart Surgery;  Laterality: N/A;  Open chest with Esmark dressing with Ioban sealant coverage.  . EXPLORATION POST OPERATIVE OPEN HEART N/A 08/18/2019   Procedure: EXPLORATION POST OPERATIVE OPEN HEART (performed 04/23 on unit);  Surgeon: Fredrich Romans  Z, MD;  Location: [place];  Service: Open Heart Surgery;  Laterality: N/A;  . EXPLORATION POST OPERATIVE OPEN HEART N/A 08/24/2019   Procedure: CHEST WASHOUT POST OPERATIVE OPEN HEART;  Surgeon: [name], MD;  Location: [place];  Service: Open Heart Surgery;  Laterality: N/A;  . EXPLORATION POST OPERATIVE OPEN HEART N/A 08/29/2019   Procedure: CHEST WOUND WASHOUT POST OPERATIVE OPEN HEART;  Surgeon: [name], MD;  Location: [place];  Service: Open Heart Surgery;   Laterality: N/A;  . EXPLORATION POST OPERATIVE OPEN HEART N/A 09/04/2019   Procedure: MEDIASTINAL EXPLORATION WITH STERNAL WOUND IRRIGATION;  Surgeon: Grace Forrest, MD;  Location: [place];  Service: Open Heart Surgery;  Laterality: N/A;  . EXPLORATION POST OPERATIVE OPEN HEART N/A 09/14/2019   Procedure: EVACUATION OF HEMATOMA;  Surgeon: Grace Ogden, MD;  Location: [place];  Service: Open Heart Surgery;  Laterality: N/A;  . IR FLUORO GUIDE CV LINE LEFT  09/19/2019  . IR US GUIDE VASC ACCESS LEFT  09/19/2019  . LAPAROSCOPIC LYSIS OF ADHESIONS N/A 09/06/2019   Procedure: LAPAROSCOPIC OMENTAL HARVEST;  Surgeon: Kinsinger, Arta Bruce, MD;  Location: [place];  Service: General;  Laterality: N/A;  . LEFT HEART CATH AND CORONARY ANGIOGRAPHY N/A 08/10/2019   Procedure: LEFT HEART CATH AND CORONARY ANGIOGRAPHY;  Surgeon: Nigel Mormon, MD;  Location: [place] CV [facility];  Service: Cardiovascular;  Laterality: N/A;  . LEFT HEART CATHETERIZATION WITH CORONARY ANGIOGRAM N/A 02/27/2014   Procedure: LEFT HEART CATHETERIZATION WITH CORONARY ANGIOGRAM;  Surgeon: Laverda Page, MD;  Location: [facility] CATH LAB;  Service: Cardiovascular;  Laterality: N/A;  . MEDIASTINAL EXPLORATION N/A 09/06/2019   Procedure: MEDIASTINAL EXPLORATION;  Surgeon: Grace Ilijah, MD;  Location: [place];  Service: Thoracic;  Laterality: N/A;  . PECTORALIS FLAP  09/06/2019   Procedure: Pectoralis ADVANCEMENT Flap;  Surgeon: Wallace Going, DO;  Location: [place];  Service: Plastics;;  . PERCUTANEOUS CORONARY STENT INTERVENTION (PCI-S)  02/27/2014   Procedure: PERCUTANEOUS CORONARY STENT INTERVENTION (PCI-S);  Surgeon: Laverda Page, MD;  Location: [facility] C[facility] LAB;  Service: Cardiovascular;;  rt PDA  3.0/28mm Promus stent  . RADIAL ARTERY HARVEST Left 08/15/2019   Procedure: Radial Artery Harvest;  Surgeon: [name], MD;  Location: [place];  Service: Open Heart Surgery;  Laterality: Left;  . RIB PLATING N/A 09/06/2019    Procedure: STERNAL PLATING;  Surgeon: Grace Curran, MD;  Location: [place];  Service: Thoracic;  Laterality: N/A;  . TEE WITHOUT CARDIOVERSION N/A 08/15/2019   Procedure: TRANSESOPHAGEAL ECHOCARDIOGRAM (TEE);  Surgeon: [name], MD;  Location: [place];  Service: Open Heart Surgery;  Laterality: N/A;  . TRACHEOSTOMY TUBE PLACEMENT  08/29/2019   Procedure: Tracheostomy;  Surgeon: [name], MD;  Location: [place] [facility];  Service: Open Heart Surgery;;  . TRANSURETHRAL RESECTION OF PROSTATE  04/04/2012   Procedure: TRANSURETHRAL RESECTION OF THE PROSTATE WITH GYRUS INSTRUMENTS;  Surgeon: Ailene Rud, MD;  Location: [place];  Service: Urology;  Laterality: N/A;  . TRANSURETHRAL RESECTION OF PROSTATE N/A 09/27/2014   Procedure: TRANSURETHRAL RESECTION OF THE PROSTATE ;  Surgeon: Carolan Clines, MD;  Location: [facility];  Service: Urology;  Laterality: N/A;  . UPPER GASTROINTESTINAL ENDOSCOPY      Allergies: Patient has no known allergies.  Medications: Prior to Admission medications   Medication Sig Start Date End Date Taking? Authorizing Provider  aspirin EC 81 MG tablet Take 81 mg by mouth daily.  Yes [provider]  atorvastatin (LIPITOR) 20 MG tablet TAKE 1 TABLET BY MOUTH EVERY DAY IN THE EVENING Patient taking differently: Take 20 mg by mouth every evening.  06/22/19  Yes Adrian Prows, MD  cloNIDine (CATAPRES) 0.2 MG tablet TAKE 1 TABLET (0.2 MG TOTAL) BY MOUTH DAILY. 06/22/19  Yes Adrian Prows, MD  cromolyn (OPTICROM) 4 % ophthalmic solution Place 1 drop into both eyes 4 (four) times daily as needed (dry eyes).  02/02/17  Yes [provider]  empagliflozin (JARDIANCE) 10 MG TABS tablet Take 10 mg by mouth daily before breakfast. 07/25/19  Yes Marrian Salvage, FNP  furosemide (LASIX) 20 MG tablet TAKE 1 TABLET BY MOUTH EVERY DAY Patient taking differently: Take 20 mg by mouth daily.  08/01/19  Yes Adrian Prows, MD  isosorbide mononitrate  (IMDUR) 60 MG 24 hr tablet Take 60 mg by mouth daily.   Yes [provider]  metFORMIN (GLUCOPHAGE) 1000 MG tablet TAKE 1 TABLET TWICE A DAY WITH A MEAL Patient taking differently: Take 1,000 mg by mouth 2 (two) times daily with a meal.  07/31/19  Yes Hoyt Koch, MD  metoprolol succinate (TOPROL-XL) 100 MG 24 hr tablet Take 1 tablet (100 mg total) by mouth daily. Take with or immediately following a meal. 07/24/19  Yes Adrian Prows, MD  nitroGLYCERIN (NITROSTAT) 0.4 MG SL tablet Place 1 tablet (0.4 mg total) under the tongue every 5 (five) minutes as needed for chest pain. 06/01/19  Yes Adrian Prows, MD  olmesartan-hydrochlorothiazide (BENICAR HCT) 40-25 MG tablet TAKE 1 TABLET BY MOUTH EVERY DAY Patient taking differently: Take 1 tablet by mouth daily.  07/03/19  Yes Adrian Prows, MD  PREVIDENT 5000 BOOSTER PLUS 1.1 % PSTE Take 1 application by mouth daily.  06/14/18  Yes [provider]  silodosin (RAPAFLO) 8 MG CAPS capsule Take 8 mg by mouth at bedtime. 07/27/19  Yes [provider]  spironolactone (ALDACTONE) 25 MG tablet Take 1 tablet (25 mg total) by mouth daily. 06/01/19 08/30/19 Yes Adrian Prows, MD  sucralfate (CARAFATE) 1 GM/10ML suspension Take 10 mLs (1 g total) by mouth 4 (four) times daily -  with meals and at bedtime. 07/24/19  Yes Marrian Salvage, FNP  tamsulosin [facility]) 0.4 MG CAPS capsule Take 0.4 mg by mouth daily after breakfast.  05/20/18  Yes [provider]  Trospium Chloride 60 MG CP24 Take 1 capsule by mouth daily. 07/31/19  Yes [provider]  verapamil (VERELAN PM) 240 MG 24 hr capsule TAKE 1 CAPSULE BY MOUTH EVERY DAY Patient taking differently: Take 240 mg by mouth daily.  07/24/19  Yes Adrian Prows, MD     Family History  Problem Relation Age of Onset  . Diabetes Mother   . Hypertension Mother   . Diabetes Father   . Diabetes Brother   . Hypertension Brother   . Diabetes Brother     Social History   Socioeconomic History    . Marital status: Married    Spouse name: Not on file  . Number of children: 2  . Years of education: Not on file  . Highest education level: Not on file  Occupational History  . Not on file  Tobacco Use  . Smoking status: Former Smoker    Packs/day: 0.50    Years: 20.00    Pack years: 10.00    Types: Cigarettes    Quit date: 03/28/1990    Years since quitting: 29.5  . Smokeless tobacco: Never Used  Substance and Sexual Activity  . Alcohol use: Yes    Alcohol/week: 0.0 standard drinks    Comment: 1 beer week rarely  . Drug use: No  . Sexual activity: Not Currently  Other Topics Concern  . Not on file  Social History Narrative  . Not on file   Social Determinants of Health   Financial Resource Strain:   . Difficulty of Paying Living Expenses:   Food Insecurity:   . Worried About Charity fundraiser in the Last Year:   . Arboriculturist in the Last Year:   Transportation Needs:   . Film/video editor (Medical):   Marland Kitchen Lack of Transportation (Non-Medical):   Physical Activity:   . Days of Exercise per Week:   . Minutes of Exercise per Session:   Stress:   . Feeling of Stress :   Social Connections:   . Frequency of Communication with Friends and Family:   . Frequency of Social Gatherings with Friends and Family:   . Attends Religious Services:   . Active Member of Clubs or Organizations:   . Attends Archivist Meetings:   Marland Kitchen Marital Status:      Review of Systems: A 12 point ROS discussed and pertinent positives are indicated in the HPI above.  All other systems are negative.  Review of Systems  Constitutional: Negative for fatigue and fever.  Respiratory: Negative for cough and shortness of breath.   Cardiovascular: Negative for chest pain.  Gastrointestinal: Negative for abdominal pain, nausea and vomiting.  Genitourinary: Negative for dysuria.  Musculoskeletal: Negative for back pain.  Psychiatric/Behavioral: Negative for behavioral problems and  confusion.    Vital Signs: BP 124/73   Pulse (!) 108   Temp 98.5 F (36.9 C) (Oral)   Resp 19   Ht 5' 9"  (1.753 m)   Wt 188 lb 4.4 oz (85.4 kg)   SpO2 99%   BMI 27.80 kg/m   Physical Exam Vitals and nursing note reviewed.  Constitutional:      General: He is not in acute distress.    Appearance: He is well-developed. He is not ill-appearing.  Neck:     Comments: L IJ HD catheter in place.  Bleeding controlled. Cardiovascular:     Rate and Rhythm: Normal rate and regular rhythm.  Pulmonary:     Effort: Pulmonary effort is normal.     Comments: Trach in place Abdominal:     Palpations: Abdomen is soft.  Musculoskeletal:     Cervical back: Normal range of motion and neck supple.  Skin:    General: Skin is warm and dry.  Neurological:     General: No focal deficit present.     Mental Status: He is alert and oriented to person, place, and time.      MD Evaluation Airway: Other (comments)(trached on o2) Heart: WNL Heart  comments: EF 30-35% Abdomen: WNL Chest/ Lungs: Other (comments)(wheezing) ASA  Classification: 3 Mallampati/Airway Score: Three   Imaging: CT ABDOMEN WO CONTRAST  Result Date: 09/28/2019 CLINICAL DATA:  Evaluation for possible percutaneous gastrostomy tube placement. EXAM: CT ABDOMEN WITHOUT CONTRAST TECHNIQUE: Multidetector CT imaging of the abdomen was performed following the standard protocol without IV contrast. COMPARISON:  CT of the chest on 09/18/2019 FINDINGS: Lower chest: Atelectasis at the posterior left lung base. Hepatobiliary: No focal liver abnormality is seen. No gallstones, gallbladder wall thickening, or biliary dilatation. Pancreas: Unremarkable. No pancreatic ductal dilatation or surrounding inflammatory changes. Spleen: Normal in size without  focal abnormality. Adrenals/Urinary Tract: Stable hyperplastic and nodular appearance of the adrenal glands bilaterally. Cyst of the lateral mid left kidney demonstrates internal simple fluid  density and measures approximately 2.6 cm. No hydronephrosis or renal calculi identified. Stomach/Bowel: No evidence of hiatal hernia. Feeding tube in place that extends into the proximal jejunum. Gastric positioning normal with some of the stomach located posterior to the left lobe of the liver when decompressed. The stomach does lie superior to the transverse colon. No evidence of bowel obstruction, ileus, inflammation or free air. Vascular/Lymphatic: No significant vascular findings are present. No enlarged abdominal lymph nodes. Other: No ascites, body wall edema or focal fluid collections. No focal hernias. Musculoskeletal: No acute or significant osseous findings. IMPRESSION: 1. Gastric positioning normal with some of the stomach located posterior to the left lobe of the liver when decompressed. There would likely be an adequate window for percutaneous gastrostomy tube placement after air insufflation of the stomach. 2. Stable hyperplastic and nodular appearance of the adrenal glands bilaterally. 3. Atelectasis at the posterior left lung base. Electronically Signed   By: Aletta Edouard M.D.   On: 09/28/2019 16:50   DG Chest 1 View  Result Date: 08/31/2019 CLINICAL DATA:  Intubated, worsening leukocytosis EXAM: CHEST  1 VIEW COMPARISON:  08/29/2019 chest radiograph. FINDINGS: Tracheostomy tube tip overlies the tracheal air column at the thoracic inlet. Enteric tube enters stomach with the tip not seen on this image. Right subclavian central venous catheter terminates over the cavoatrial junction. Right internal jugular central venous sheath terminates in the right brachiocephalic vein. Stable bilateral chest tubes and mediastinal drain. Stable cardiomediastinal silhouette with mild cardiomegaly. No pneumothorax. No pleural effusion. No pulmonary edema. New hazy left lung base opacity. Similar minimal patchy opacity in the peripheral right mid lung. IMPRESSION: 1. Well-positioned support structures. No  pneumothorax. 2. New hazy left lung base opacity, favor atelectasis, cannot exclude aspiration or pneumonia. 3. Stable minimal patchy opacity in the peripheral right mid lung. Electronically Signed   By: Ilona Sorrel M.D.   On: 08/31/2019 08:50   CT HEAD WO CONTRAST  Result Date: 09/09/2019 CLINICAL DATA:  Stroke follow-up EXAM: CT HEAD WITHOUT CONTRAST TECHNIQUE: Contiguous axial images were obtained from the base of the skull through the vertex without intravenous contrast. COMPARISON:  Head CT 09/07/2019 FINDINGS: Brain: There is no mass, hemorrhage or extra-axial collection. There is generalized atrophy without lobar predilection. Hypodensity of the white matter is most commonly associated with chronic microvascular disease. Vascular: No abnormal hyperdensity of the major intracranial arteries or dural venous sinuses. No intracranial atherosclerosis. Skull: The visualized skull base, calvarium and extracranial soft tissues are normal. Sinuses/Orbits: No fluid levels or advanced mucosal thickening of the visualized paranasal sinuses. No mastoid or middle ear effusion. The orbits are normal. IMPRESSION: Chronic microvascular ischemia and generalized atrophy without acute intracranial abnormality. Electronically Signed   By: Ulyses Jarred M.D.   On: 09/09/2019 04:33   CT HEAD WO CONTRAST  Result Date: 09/07/2019 CLINICAL DATA:  Stroke follow-up.  New left-sided paralysis. EXAM: CT HEAD WITHOUT CONTRAST TECHNIQUE: Contiguous axial images were obtained from the base of the skull through the vertex without intravenous contrast. COMPARISON:  08/26/2019 FINDINGS: Brain: No evidence of acute infarction, hemorrhage, obstructive hydrocephalus, extra-axial collection or mass lesion/mass effect. Generalized atrophy and ventriculomegaly. Vascular: No hyperdense vessel or unexpected calcification. Skull: Normal. Negative for fracture or focal lesion. Sinuses/Orbits: Negative IMPRESSION: 1. No acute finding.  No visible  infarct. 2. Atrophy and ventriculomegaly. Electronically Signed  By: Monte Fantasia M.D.   On: 09/07/2019 09:22   CT CHEST WO CONTRAST  Result Date: 09/18/2019 CLINICAL DATA:  Chest pain, shortness of breath, pleural effusion at, history of coronary artery disease post CABG, diabetes mellitus, hypertension EXAM: CT CHEST WITHOUT CONTRAST TECHNIQUE: Multidetector CT imaging of the chest was performed following the standard protocol without IV contrast. Sagittal and coronal MPR images reconstructed from axial data set. COMPARISON:  08/09/2019 FINDINGS: Cardiovascular: Atherosclerotic calcifications aorta and coronary arteries. Postsurgical changes of CABG. LEFT central venous catheter with tip at SVC. Enlargement of cardiac chambers. Minimal pericardial effusion. Aorta normal caliber. Mediastinum/Nodes: Tip of tracheostomy tube above carina. Nasogastric tube extends into stomach. Two surgical drains in the anterior chest wall. Esophagus unremarkable. 6 mm RIGHT thyroid nodule; BILATERAL pleural effusions and scattered atelectasis greatest in LEFT lower lobe. No definite infiltrate or pneumothorax. (Ref: J Am Coll Radiol. 2015 Feb;12(2): 143-50). Base of cervical region otherwise normal appearance. Stranding of the anterior mediastinum likely related to prior cardiac surgery and sternal dehiscence, with herniation of fat through the sternotomy. No thoracic adenopathy. Lungs/Pleura: BILATERAL pleural effusions, small. Scattered atelectasis in both lungs greatest LEFT lower lobe. Pleural calcifications in hemithoraces bilaterally likely reflects asbestos exposure. No infiltrate or pneumothorax. Upper Abdomen: Diffuse somewhat nodular thickening of the adrenal glands bilaterally likely representing multiple small nodules question adenomas as noted on prior exam. High attenuation material within the gallbladder question vicarious excretion of prior contrast. Hepatic margins appear slightly irregular raising question  of cirrhosis Musculoskeletal: No additional osseous findings. Diffuse stranding identified along the RIGHT pectoralis major and minor muscles which could be related to prior surgery, hemorrhage, or infection. No soft tissue gas. IMPRESSION: BILATERAL pleural effusions and scattered atelectasis greatest in LEFT lower lobe. Pleural calcifications in hemithoraces bilaterally question asbestos exposure. Stranding of the anterior mediastinum likely related to prior cardiac surgery and sternal dehiscence, with herniation of fat through the sternotomy. Diffuse stranding along the RIGHT pectoralis major and minor muscles which could be related to prior surgery, hemorrhage, or infection. 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 Minimally irregular hepatic margins cannot exclude cirrhosis. Aortic Atherosclerosis (ICD10-I70.0). Electronically Signed   By: Lavonia Dana M.D.   On: 09/18/2019 15:27   IR Fluoro Guide CV Line Left  Result Date: 09/19/2019 INDICATION: End-stage renal disease. In need of durable intravenous access for continuation dialysis. Note, patient has existing left external jugular vein approach temporary dialysis catheter. Patient currently with open median sternotomy with subcutaneous surgical drains involving the ventral aspect of the chest bilaterally, the right-side of which courses regional to the expected subcutaneous track of a right chest dialysis catheter. Additionally, there is a skin defect/potential developing abscess involving the ventral aspect of the right upper chest, at the location of the standard entrance site of a right internal jugular approach dialysis catheter. As such, we will proceed with image guided placement of a left internal jugular approach dialysis catheter and removal of the temporary left external jugular approach dialysis catheter. EXAM: TUNNELED CENTRAL VENOUS HEMODIALYSIS CATHETER PLACEMENT WITH ULTRASOUND AND FLUOROSCOPIC GUIDANCE MEDICATIONS: Ancef 2 gm IV . The antibiotic was  given in an appropriate time interval prior to skin puncture. ANESTHESIA/SEDATION: Moderate (conscious) sedation was employed during this procedure. A total of Versed 1.5 mg and Fentanyl 75 mcg was administered intravenously. Moderate Sedation Time: 17 minutes. The patient's level of consciousness and vital signs were monitored continuously by radiology nursing throughout the procedure under my direct supervision. FLUOROSCOPY TIME:  1 minute, 6 seconds (11 mGy) COMPLICATIONS:  None immediate. PROCEDURE: Informed written consent was obtained from the patient's family after a discussion of the risks, benefits, and alternatives to treatment. Questions regarding the procedure were encouraged and answered. The left neck and chest as well as the external portion of the existing left external jugular approach dialysis catheter were prepped with chlorhexidine in a sterile fashion, and a sterile drape was applied covering the operative field. Maximum barrier sterile technique with sterile gowns and gloves were used for the procedure. A timeout was performed prior to the initiation of the procedure. After creating a small venotomy incision, a micropuncture kit was utilized to access the internal jugular vein. Real-time ultrasound guidance was utilized for vascular access including the acquisition of a permanent ultrasound image documenting patency of the accessed vessel. The microwire was utilized to measure appropriate catheter length. A stiff Glidewire was advanced to the level of the IVC and the micropuncture sheath was exchanged for a peel-away sheath. A palindrome tunneled hemodialysis catheter measuring 28 cm from tip to cuff was tunneled in a retrograde fashion from the anterior chest wall to the venotomy incision. Next, the external jugular approach temporary dialysis catheter was removed and superficial hemostasis was achieved with manual compression. The new catheter was then placed through the peel-away sheath with  tips ultimately positioned within the superior aspect of the right atrium. Final catheter positioning was confirmed and documented with a spot radiographic image. The catheter aspirates and flushes normally. The catheter was flushed with appropriate volume heparin dwells. The catheter exit site was secured with a 0-Prolene retention suture. The venotomy incision was closed with Dermabond and Steri-strips. Dressings were applied. The patient tolerated the procedure well without immediate post procedural complication. IMPRESSION: Successful placement of 28 cm tip to cuff tunneled hemodialysis catheter via the left internal jugular vein with tips terminating within the superior aspect of the right atrium. The catheter is ready for immediate use. Electronically Signed   By: Sandi Mariscal M.D.   On: 09/19/2019 15:25   IR US Guide Vasc Access Left  Result Date: 09/19/2019 INDICATION: End-stage renal disease. In need of durable intravenous access for continuation dialysis. Note, patient has existing left external jugular vein approach temporary dialysis catheter. Patient currently with open median sternotomy with subcutaneous surgical drains involving the ventral aspect of the chest bilaterally, the right-side of which courses regional to the expected subcutaneous track of a right chest dialysis catheter. Additionally, there is a skin defect/potential developing abscess involving the ventral aspect of the right upper chest, at the location of the standard entrance site of a right internal jugular approach dialysis catheter. As such, we will proceed with image guided placement of a left internal jugular approach dialysis catheter and removal of the temporary left external jugular approach dialysis catheter. EXAM: TUNNELED CENTRAL VENOUS HEMODIALYSIS CATHETER PLACEMENT WITH ULTRASOUND AND FLUOROSCOPIC GUIDANCE MEDICATIONS: Ancef 2 gm IV . The antibiotic was given in an appropriate time interval prior to skin puncture.  ANESTHESIA/SEDATION: Moderate (conscious) sedation was employed during this procedure. A total of Versed 1.5 mg and Fentanyl 75 mcg was administered intravenously. Moderate Sedation Time: 17 minutes. The patient's level of consciousness and vital signs were monitored continuously by radiology nursing throughout the procedure under my direct supervision. FLUOROSCOPY TIME:  1 minute, 6 seconds (11 mGy) COMPLICATIONS: None immediate. PROCEDURE: Informed written consent was obtained from the patient's family after a discussion of the risks, benefits, and alternatives to treatment. Questions regarding the procedure were encouraged and answered. The left neck and  chest as well as the external portion of the existing left external jugular approach dialysis catheter were prepped with chlorhexidine in a sterile fashion, and a sterile drape was applied covering the operative field. Maximum barrier sterile technique with sterile gowns and gloves were used for the procedure. A timeout was performed prior to the initiation of the procedure. After creating a small venotomy incision, a micropuncture kit was utilized to access the internal jugular vein. Real-time ultrasound guidance was utilized for vascular access including the acquisition of a permanent ultrasound image documenting patency of the accessed vessel. The microwire was utilized to measure appropriate catheter length. A stiff Glidewire was advanced to the level of the IVC and the micropuncture sheath was exchanged for a peel-away sheath. A palindrome tunneled hemodialysis catheter measuring 28 cm from tip to cuff was tunneled in a retrograde fashion from the anterior chest wall to the venotomy incision. Next, the external jugular approach temporary dialysis catheter was removed and superficial hemostasis was achieved with manual compression. The new catheter was then placed through the peel-away sheath with tips ultimately positioned within the superior aspect of the  right atrium. Final catheter positioning was confirmed and documented with a spot radiographic image. The catheter aspirates and flushes normally. The catheter was flushed with appropriate volume heparin dwells. The catheter exit site was secured with a 0-Prolene retention suture. The venotomy incision was closed with Dermabond and Steri-strips. Dressings were applied. The patient tolerated the procedure well without immediate post procedural complication. IMPRESSION: Successful placement of 28 cm tip to cuff tunneled hemodialysis catheter via the left internal jugular vein with tips terminating within the superior aspect of the right atrium. The catheter is ready for immediate use. Electronically Signed   By: Sandi Mariscal M.D.   On: 09/19/2019 15:25   DG Chest Port 1 View  Result Date: 09/23/2019 CLINICAL DATA:  History of recent mediastinal expiration EXAM: PORTABLE CHEST 1 VIEW COMPARISON:  09/18/2019 FINDINGS: Postsurgical changes are again seen and stable. New left jugular dialysis catheter is noted with the tip in the right atrium. Tracheostomy tube and feeding catheter are again seen. Slight increased density is noted in the left hemithorax likely related to posterior effusion. No focal infiltrate is noted. No pneumothorax is seen. IMPRESSION: Left-sided pleural effusion. New left jugular dialysis catheter. Remainder of the exam is stable. Electronically Signed   By: Inez Catalina M.D.   On: 09/23/2019 09:22   DG CHEST PORT 1 VIEW  Result Date: 09/18/2019 CLINICAL DATA:  Rhon[place] EXAM: PORTABLE CHEST 1 VIEW COMPARISON:  09/15/2019 FINDINGS: Support devices are stable. Prior CABG. Cardiomegaly. Worsening bilateral airspace opacities, most confluent in the left lower lobe. No visible effusions or acute bony abnormality. IMPRESSION: Worsening bilateral airspace disease, favor edema. Dense consolidation in the left lower lobe could reflect atelectasis or pneumonia. Electronically Signed   By: Rolm Baptise  M.D.   On: 09/18/2019 07:21   DG Chest Port 1 View  Result Date: 09/15/2019 CLINICAL DATA:  Respiratory failure EXAM: PORTABLE CHEST 1 VIEW COMPARISON:  Sep 14, 2019 FINDINGS: Tracheostomy catheter tip is 6.9 cm above the carina. Feeding tube tip is below the diaphragm. Central catheter tip is near the junction of the left innominate vein and superior vena cava, unchanged. A tube is either in or overlying the right hemithorax with the tip in the medial upper right hemithorax, stable. A new tube is either within or overlying the chest from the leftward approach with the tip just to the right  of midline in the upper thoracic region. No pneumothorax evident. There is a left pleural effusion with left base atelectasis. The previously noted nodular opacity in the periphery of the right mid lung is stable. Heart remains mildly enlarged, stable, with pulmonary vascularity normal. No adenopathy. IMPRESSION: Tube and catheter positions as described without pneumothorax. Left pleural effusion with left base atelectasis persists. Stable nodular opacity right mid lung. No new opacity evident. Stable cardiac silhouette. Electronically Signed   By: Lowella Grip III M.D.   On: 09/15/2019 08:47   DG CHEST PORT 1 VIEW  Result Date: 09/14/2019 CLINICAL DATA:  Respiratory distress EXAM: PORTABLE CHEST 1 VIEW COMPARISON:  Sep 12, 2019 FINDINGS: Tracheostomy catheter tip is 7.2 cm above the carina. Feeding tube tip is below the diaphragm. Dual lumen catheter has its tip at the junction of the left innominate vein and superior vena cava. There is a catheter on the right with its tip in or overlying the medial right hemithorax, unchanged. No new tube or catheter evident compared to 2 days prior. No pneumothorax. There is a small left pleural effusion with atelectatic change in the left base. Stable nodular opacity in the periphery of the right mid lung measuring 1.4 x 1.3 cm. No new opacity evident. Heart is mildly enlarged  with pulmonary vascularity normal. Postoperative changes noted. No appreciable adenopathy. No bone lesions. IMPRESSION: Tube and catheter positions as described. No pneumothorax. Persistent left pleural effusion with left base atelectasis. Nodular opacity persists in the periphery of the right lung. No new opacity evident. Stable cardiac silhouette. Postoperative changes noted. Electronically Signed   By: Lowella Grip III M.D.   On: 09/14/2019 08:28   DG Chest Port 1 View  Result Date: 09/12/2019 CLINICAL DATA:  Congestive heart failure. EXAM: PORTABLE CHEST 1 VIEW COMPARISON:  Sep 10, 2019. FINDINGS: Stable cardiomegaly. Tracheostomy and feeding tubes are unchanged in position. Left subclavian catheter is noted with tip in expected position of the SVC. No pneumothorax is noted. Right lung is clear. Mild left basilar atelectasis or infiltrate is noted with associated pleural effusion. Bony thorax is unremarkable. IMPRESSION: Stable support apparatus. Mild left basilar atelectasis or infiltrate is noted with associated pleural effusion. Electronically Signed   By: Marijo Conception M.D.   On: 09/12/2019 09:24   DG Chest Port 1 View  Result Date: 09/10/2019 CLINICAL DATA:  Chest pain. Status post mild cardial infarction. Follow-up exam. EXAM: PORTABLE CHEST 1 VIEW COMPARISON:  09/08/2019 and earlier studies. FINDINGS: Changes from CABG surgery are stable. The cardiac silhouette is mildly enlarged. There is persistent opacity at the left lung base obscuring the hemidiaphragm. Stable nodule in the peripheral right mid lung. Remainder of the lungs is clear. No convincing right pleural effusion. Presumed left pleural effusion. No pneumothorax. Tracheostomy tube, left sided dual lumen central venous catheter and enteric feeding tube are stable. IMPRESSION: 1. No change from the most recent prior study. 2. Persistent left lower lung zone opacity consistent with atelectasis and pleural fluid. 3. Support apparatus  is stable. Electronically Signed   By: Lajean Manes M.D.   On: 09/10/2019 09:26   DG Chest Port 1 View  Result Date: 09/08/2019 CLINICAL DATA:  Status post CABG and mediastinal washout. EXAM: PORTABLE CHEST 1 VIEW COMPARISON:  Chest x-ray from yesterday. FINDINGS: Unchanged tracheostomy and feeding tubes. Unchanged right internal jugular and left subclavian central venous catheters. Unchanged right chest tube. Stable cardiomegaly status post CABG. Normal pulmonary vascularity. Unchanged retrocardiac opacity and small left  pleural effusion. No pneumothorax. Unchanged 1.4 cm nodule in the peripheral right upper lobe. No acute osseous abnormality. IMPRESSION: 1. Stable left basilar atelectasis and effusion. Electronically Signed   By: Titus Dubin M.D.   On: 09/08/2019 08:05   DG Chest Port 1 View  Result Date: 09/07/2019 CLINICAL DATA:  Tracheostomy tube in place. EXAM: PORTABLE CHEST 1 VIEW COMPARISON:  yesterday FINDINGS: Tracheostomy tube in place. Bilateral central line with tips near the SVC origin. Right chest tube in place. Retrocardiac opacity is unchanged, likely atelectasis and pleural fluid in this setting. Stable postoperative heart size. IMPRESSION: Stable hardware positioning and retrocardiac opacification. Electronically Signed   By: Monte Fantasia M.D.   On: 09/07/2019 08:45   DG Chest Port 1 View  Result Date: 09/06/2019 CLINICAL DATA:  Recent sternotomy revision EXAM: PORTABLE CHEST 1 VIEW COMPARISON:  09/06/2019 FINDINGS: Tracheostomy tube, feeding catheter, right jugular central line left-sided temporary dialysis catheter are noted and stable. New postoperative changes are noted in the midline consistent with the recent sternal revision. Right-sided thoracostomy catheter is noted. No pneumothorax is seen. Mild left basilar atelectasis is noted slightly increased from the prior study. IMPRESSION: Tubes and lines as described above. Recent postsurgical changes. Mild left basilar  atelectasis increased from the prior exam. Electronically Signed   By: Inez Catalina M.D.   On: 09/06/2019 19:50   DG CHEST PORT 1 VIEW  Result Date: 09/06/2019 CLINICAL DATA:  Status post CABG. EXAM: PORTABLE CHEST 1 VIEW COMPARISON:  Chest x-ray dated Sep 04, 2019. FINDINGS: Unchanged tracheostomy and feeding tubes. Unchanged left subclavian central venous catheter and right-sided chest tube. Interval removal of the mediastinal drain with new mediastinal wound VAC. Stable cardiomegaly status post CABG. Normal pulmonary vascularity. Unchanged left basilar atelectasis and small left pleural effusion. Unchanged 1.4 cm nodule in the peripheral right upper lobe. No pneumothorax. No acute osseous abnormality. IMPRESSION: 1. Stable chest. Unchanged left basilar atelectasis and small left pleural effusion. Electronically Signed   By: Titus Dubin M.D.   On: 09/06/2019 08:29   DG Chest Port 1 View  Result Date: 09/04/2019 CLINICAL DATA:  Chest tube EXAM: PORTABLE CHEST 1 VIEW COMPARISON:  09/03/2019 FINDINGS: Support devices including right chest tube remain in place, unchanged. No pneumothorax. Left lower lobe airspace opacity and nodular density in the right mid lung are unchanged. Heart is borderline in size. No visible effusions or acute bony abnormality. IMPRESSION: No significant change since prior study. Electronically Signed   By: Rolm Baptise M.D.   On: 09/04/2019 08:12   DG CHEST PORT 1 VIEW  Result Date: 09/03/2019 CLINICAL DATA:  Respiratory failure. EXAM: PORTABLE CHEST 1 VIEW COMPARISON:  Sep 02, 2019 FINDINGS: Tracheostomy catheter tip is 6.9 cm above the carina. Enteric tube tip is below the diaphragm. Central catheter tip is at the junction of the left innominate vein and superior vena cava. There is a right chest tube and a mediastinal drain. No pneumothorax appreciable. There is persistent consolidation in the left lower lobe with left pleural effusion. Right lung is clear except for  pulmonary nodular lesion noted in the periphery of the right mid lung. Heart is enlarged with pulmonary vascularity normal. Status post coronary artery bypass grafting. No adenopathy. No bone lesions. IMPRESSION: Tube and catheter positions as described without pneumothorax. Persistent airspace consolidation left lower lobe, likely due to pneumonia or potential aspiration with small pleural effusion on the left. Stable nodular opacity right mid lung. No new opacity evident. Stable cardiac silhouette. Electronically  Signed   By: Lowella Grip III M.D.   On: 09/03/2019 07:54   DG CHEST PORT 1 VIEW  Result Date: 09/02/2019 CLINICAL DATA:  08/09/19 Admitted with NSTEM and ? Small RLL PE. Weight 213 pounds. 08/15/19 CABG x4 on 08/15/19 08/18/19 Cardiac arrest due to tamponade. Chest opened for washout 4/26 Chest washout in OR 4/29 CVVH started 5/5 OR for chest washout, tracheostomy EXAM: PORTABLE CHEST - 1 VIEW COMPARISON:  the previous day's study FINDINGS: Tracheostomy, left subclavian hemodialysis catheter, feeding tube, mediastinal drains stable. Interval removal of right subclavian central venous catheter. Persistent left basilar consolidation/atelectasis. 1.7 cm nodule laterally in the right mid lung as before. Stable cardiomegaly. Aorticopulmonary window is obscured. Exclude small layering left pleural effusion. No pneumothorax. Visualized bones unremarkable. IMPRESSION: Stable left basilar consolidation/atelectasis and possible small effusion. Electronically Signed   By: Lucrezia Europe M.D.   On: 09/02/2019 08:57   DG CHEST PORT 1 VIEW  Result Date: 09/01/2019 CLINICAL DATA:  Central line placement EXAM: PORTABLE CHEST 1 VIEW COMPARISON:  09/01/2019 FINDINGS: Tracheostomy tube in satisfactory position. Nasogastric tube coursing below the diaphragm. Right subclavian central venous catheter with the tip projecting over the SVC. Left jugular sheath projecting over the confluence of the jugular vein and innominate.  Mediastinal drain in unchanged position Bilateral chest tubes are again noted. No pneumothorax. Small left pleural effusion. Left basilar atelectasis. Ill-defined nodular airspace disease at the periphery of the right mid lung. Stable cardiomegaly. Status post CABG. No acute osseous abnormality. IMPRESSION: 1. Support lines and tubing in satisfactory position. 2. Small left pleural effusion with left basilar atelectasis. 3. Ill-defined nodular airspace disease at the periphery of the right mid lung. This likely reflects small pulmonary contusion or area of atelectasis, but attention on follow-up examination is recommended. Electronically Signed   By: Kathreen Devoid   On: 09/01/2019 16:44   DG Chest Port 1 View  Result Date: 09/01/2019 CLINICAL DATA:  Status post chest wound evacuation. Tracheostomy present. EXAM: PORTABLE CHEST 1 VIEW COMPARISON:  Aug 31, 2019 FINDINGS: Tracheostomy catheter tip is 6.5 cm above the carina. Feeding tube tip is below the diaphragm. Central catheter tip is in the superior vena cava. There are bilateral chest tubes and a mediastinal drain. No pneumothorax appreciable. There is persistent airspace opacity in the left lower lobe with fairly small left pleural effusion. Ill-defined opacity noted in right mid lung region, stable. Heart is mildly enlarged with pulmonary vascularity normal. Patient is status post coronary artery bypass grafting. No bone lesions. IMPRESSION: Tube and catheter positions as described without evident pneumothorax. Airspace opacity left lower lobe with small left pleural effusion. Suspect atelectasis in this area with potential superimposed pneumonia or possible aspiration. Ill-defined opacity in the periphery the right mid lung remains, likely of infectious etiology. No new opacity evident. Stable cardiomegaly. Electronically Signed   By: Lowella Grip III M.D.   On: 09/01/2019 08:26   [place] EKG SITE RITE  Result Date: 08/31/2019 If Site Rite image not  attached, placement could not be confirmed due to current cardiac rhythm.   Labs:  CBC: Recent Labs    09/24/19 0234 09/25/19 0229 09/26/19 1116 09/29/19 0756  WBC 14.9* 15.7* 18.5* 19.9*  HGB 9.1* 8.7* 8.7* 8.2*  HCT 28.9* 27.2* 27.1* 26.3*  PLT 354 300 338 393    COAGS: Recent Labs    08/18/19 2002 08/19/19 0300 08/19/19 1820 09/04/19 0358 09/09/19 9379 09/10/19 0453 09/11/19 0208 09/12/19 0510 09/14/19 0805  INR 1.4* 1.5*  1.3*  --   --   --   --   --  1.2  APTT  --   --   --    < > 36 37* 42* 39*  --    < > = values in this interval not displayed.    BMP: Recent Labs    09/26/19 0345 09/27/19 0219 09/28/19 0445 09/29/19 0247  NA 133* 133* 134* 133*  K 4.0 4.0 4.3 4.5  CL 93* 95* 95* 93*  CO2 24 25 23 24   GLUCOSE 161* 174* 147* 175*  BUN 142* 85* 142* 91*  CALCIUM 9.1 8.8* 8.9 9.0  CREATININE 6.55* 4.27* 6.08* 4.77*  GFRNONAA 8* 13* 8* 11*  GFRAA 9* 15* 10* 13*    LIVER FUNCTION TESTS: Recent Labs    08/23/19 0552 08/23/19 0552 08/24/19 0405 08/25/19 0326 09/02/19 0311 09/02/19 1530 09/07/19 0223 09/07/19 0707 09/26/19 0345 09/27/19 0219 09/28/19 0445 09/29/19 0247  BILITOT 0.8  --  0.7  --  1.0  --  1.1  --   --   --   --   --   AST 50*  --  59*  --  38  --  72*  --   --   --   --   --   ALT 220*  --  169*  --  49*  --  127*  --   --   --   --   --   ALKPHOS 82  --  115  --  104  --  193*  --   --   --   --   --   PROT 4.2*  --  4.3*  --  5.1*  --  5.5*  --   --   --   --   --   ALBUMIN 1.5*   < > 1.6*   < > 2.1*   < > 2.2*   < > 2.5* 2.4* 2.4* 2.4*   < > = values in this interval not displayed.    TUMOR MARKERS: No results for input(s): AFPTM, CEA, CA199, CHROMGRNA in the last 8760 hours.  Assessment and Plan: Dysphagia, decondition In need of gastrostomy tube placement for ongoing long-term care and enteral support.   PA initiated work-up with patient at beside this AM at the time of dialysis catheter assessment.  He indicates  he is agreeable but with limited participation in conversation.  Will continue to work towards placement after further discussion with patient and his family.   Spoke with TCTS PA via phone to discuss status of all the above.   Thank you for this interesting consult.  I greatly enjoyed meeting XZAYVION VAETH and look forward to participating in their care.  A copy of this report was sent to the requesting provider on this date.  Electronically Signed: Docia Barrier, PA 09/29/2019, 11:12 AM   I spent a total of 40 Minutes    in face to face in clinical consultation, greater than 50% of which was counseling/coordinating care for dysphagia, deconditioning.

## 2019-09-29 NOTE — Procedures (Signed)
IR notified of patient with ongoing bleeding from his catheter site.   Patient brought to IR. Held pressure for 10 minutes with no oozing noted.  Injected trach with gel foam and held pressure for an additional 10 minutes.  No oozing noted after observation for 5 minutes.  Dressed catheter.   G-tube consult to follow.   Brynda Greathouse, MS RD PA-C 10:50 AM

## 2019-09-29 NOTE — Progress Notes (Signed)
Per Dr Elsworth Soho, pt NOT to be decannulated at this time.

## 2019-09-29 NOTE — Plan of Care (Signed)
  Problem: Education: Goal: Knowledge of General Education information will improve Description: Including pain rating scale, medication(s)/side effects and non-pharmacologic comfort measures Outcome: Progressing   Problem: Health Behavior/Discharge Planning: Goal: Ability to manage health-related needs will improve Outcome: Progressing   Problem: Clinical Measurements: Goal: Ability to maintain clinical measurements within normal limits will improve Outcome: Progressing Goal: Will remain free from infection Outcome: Progressing Goal: Diagnostic test results will improve Outcome: Progressing Goal: Respiratory complications will improve Outcome: Progressing Goal: Cardiovascular complication will be avoided Outcome: Progressing   Problem: Activity: Goal: Risk for activity intolerance will decrease Outcome: Progressing   Problem: Nutrition: Goal: Adequate nutrition will be maintained Outcome: Progressing   Problem: Coping: Goal: Level of anxiety will decrease Outcome: Progressing   Problem: Elimination: Goal: Will not experience complications related to bowel motility Outcome: Progressing Goal: Will not experience complications related to urinary retention Outcome: Progressing   Problem: Pain Managment: Goal: General experience of comfort will improve Outcome: Progressing   Problem: Safety: Goal: Ability to remain free from injury will improve Outcome: Progressing   Problem: Skin Integrity: Goal: Risk for impaired skin integrity will decrease Outcome: Progressing   Problem: Education: Goal: Will demonstrate proper wound care and an understanding of methods to prevent future damage Outcome: Progressing Goal: Knowledge of disease or condition will improve Outcome: Progressing Goal: Knowledge of the prescribed therapeutic regimen will improve Outcome: Progressing Goal: Individualized Educational Video(s) Outcome: Progressing   Problem: Activity: Goal: Risk for  activity intolerance will decrease Outcome: Progressing   Problem: Cardiac: Goal: Will achieve and/or maintain hemodynamic stability Outcome: Progressing   Problem: Clinical Measurements: Goal: Postoperative complications will be avoided or minimized Outcome: Progressing   Problem: Respiratory: Goal: Respiratory status will improve Outcome: Progressing   Problem: Skin Integrity: Goal: Wound healing without signs and symptoms of infection Outcome: Progressing Goal: Risk for impaired skin integrity will decrease Outcome: Progressing   Problem: Urinary Elimination: Goal: Ability to achieve and maintain adequate renal perfusion and functioning will improve Outcome: Progressing   

## 2019-09-30 ENCOUNTER — Inpatient Hospital Stay (HOSPITAL_COMMUNITY): Payer: Medicare Other

## 2019-09-30 LAB — RENAL FUNCTION PANEL
Albumin: 2.4 g/dL — ABNORMAL LOW (ref 3.5–5.0)
Anion gap: 16 — ABNORMAL HIGH (ref 5–15)
BUN: 135 mg/dL — ABNORMAL HIGH (ref 8–23)
CO2: 22 mmol/L (ref 22–32)
Calcium: 9 mg/dL (ref 8.9–10.3)
Chloride: 96 mmol/L — ABNORMAL LOW (ref 98–111)
Creatinine, Ser: 6.76 mg/dL — ABNORMAL HIGH (ref 0.61–1.24)
GFR calc Af Amer: 9 mL/min — ABNORMAL LOW (ref >60)
GFR calc non Af Amer: 7 mL/min — ABNORMAL LOW (ref >60)
Glucose, Bld: 192 mg/dL — ABNORMAL HIGH (ref 70–99)
Phosphorus: 7 mg/dL — ABNORMAL HIGH (ref 2.5–4.6)
Potassium: 4.9 mmol/L (ref 3.5–5.1)
Sodium: 134 mmol/L — ABNORMAL LOW (ref 135–145)

## 2019-09-30 LAB — CBC
HCT: 24.3 % — ABNORMAL LOW (ref 39.0–52.0)
Hemoglobin: 7.6 g/dL — ABNORMAL LOW (ref 13.0–17.0)
MCH: 31.4 pg (ref 26.0–34.0)
MCHC: 31.3 g/dL (ref 30.0–36.0)
MCV: 100.4 fL — ABNORMAL HIGH (ref 80.0–100.0)
Platelets: 413 10*3/uL — ABNORMAL HIGH (ref 150–400)
RBC: 2.42 MIL/uL — ABNORMAL LOW (ref 4.22–5.81)
RDW: 18 % — ABNORMAL HIGH (ref 11.5–15.5)
WBC: 18.7 10*3/uL — ABNORMAL HIGH (ref 4.0–10.5)
nRBC: 0 % (ref 0.0–0.2)

## 2019-09-30 LAB — GLUCOSE, CAPILLARY
Glucose-Capillary: 151 mg/dL — ABNORMAL HIGH (ref 70–99)
Glucose-Capillary: 187 mg/dL — ABNORMAL HIGH (ref 70–99)
Glucose-Capillary: 189 mg/dL — ABNORMAL HIGH (ref 70–99)
Glucose-Capillary: 191 mg/dL — ABNORMAL HIGH (ref 70–99)
Glucose-Capillary: 196 mg/dL — ABNORMAL HIGH (ref 70–99)
Glucose-Capillary: 225 mg/dL — ABNORMAL HIGH (ref 70–99)

## 2019-09-30 LAB — MAGNESIUM: Magnesium: 3 mg/dL — ABNORMAL HIGH (ref 1.7–2.4)

## 2019-09-30 MED ORDER — PENTAFLUOROPROP-TETRAFLUOROETH EX AERO: 1.0000 "application " | INHALATION_SPRAY | CUTANEOUS | Status: DC | PRN

## 2019-09-30 MED ORDER — HEPARIN SODIUM (PORCINE) 1000 UNIT/ML IJ SOLN
INTRAMUSCULAR | Status: AC
  Filled 2019-09-30: qty 4

## 2019-09-30 MED ORDER — SODIUM CHLORIDE 0.9 % IV SOLN: 100.0000 mL | INTRAVENOUS | Status: DC | PRN

## 2019-09-30 MED ORDER — LIDOCAINE-PRILOCAINE 2.5-2.5 % EX CREA: 1.0000 "application " | TOPICAL_CREAM | CUTANEOUS | Status: DC | PRN

## 2019-09-30 MED ORDER — ALTEPLASE 2 MG IJ SOLR: 2.0000 mg | Freq: Once | INTRAMUSCULAR | Status: DC | PRN

## 2019-09-30 MED ORDER — LIDOCAINE HCL (PF) 1 % IJ SOLN: 5.0000 mL | INTRAMUSCULAR | Status: DC | PRN

## 2019-09-30 MED ORDER — DARBEPOETIN ALFA 100 MCG/0.5ML IJ SOSY
100.0000 ug | PREFILLED_SYRINGE | INTRAMUSCULAR | Status: DC
  Filled 2019-09-30 (×3): qty 0.5

## 2019-09-30 MED ORDER — PHENOL 1.4 % MT LIQD
1.0000 | OROMUCOSAL | Status: DC | PRN
  Administered 2019-09-30: 1 via OROMUCOSAL
  Filled 2019-09-30: qty 177

## 2019-09-30 MED ORDER — HEPARIN SODIUM (PORCINE) 1000 UNIT/ML DIALYSIS: 1000.0000 [IU] | INTRAMUSCULAR | Status: DC | PRN

## 2019-09-30 NOTE — Progress Notes (Signed)
Pt is currently in HD, but will be available this afternoon for MBSS.  Study scheduled later this date to determine swallow function.  Marijo File, [place], [place] Office: [phone]

## 2019-09-30 NOTE — Progress Notes (Addendum)
      [address]       [employer] [id number]             [phone]        16 Days Post-Op Procedure(s) (LRB): EVACUATION OF HEMATOMA (N/A)  Subjective: Patient seen while undergoing HD  Objective: Vital signs in last 24 hours: Temp:  [98.2 F (36.8 C)-99.1 F (37.3 C)] 98.4 F (36.9 C) (06/05 0351) Pulse Rate:  [106-109] (P) 109 (06/05 0730) Cardiac Rhythm: Sinus tachycardia (06/05 0639) Resp:  [18-25] (P) 21 (06/05 0730) BP: (117-139)/(64-77) (P) 102/66 (06/05 0730) SpO2:  [98 %-100 %] (P) 100 % (06/05 0730) FiO2 (%):  [21 %] 21 % (06/04 2130) Weight:  [79.1 kg] 79.1 kg (06/05 0639)  Pre op weight 100.2 kg Current Weight  09/30/19 79.1 kg      Intake/Output from previous day: 06/04 0701 - 06/05 0700 In: 533.3 [NG/GT:533.3] Out: 300 [Urine:300]   Physical Exam:  Cardiovascular: Slightly tachycardic Pulmonary: Clear Abdomen: Soft, non tender, bowel sounds present. Extremities: Una boots bilaterally Wounds: Aquacel intact on sternum. Left radial artery wound with mild dehiscence.  Lab Results: CBC: Recent Labs    09/29/19 0756 09/30/19 0702  WBC 19.9* 18.7*  HGB 8.2* 7.6*  HCT 26.3* 24.3*  PLT 393 413*   BMET:  Recent Labs    09/29/19 0247 09/30/19 0228  NA 133* 134*  K 4.5 4.9  CL 93* 96*  CO2 24 22  GLUCOSE 175* 192*  BUN 91* 135*  CREATININE 4.77* 6.76*  CALCIUM 9.0 9.0    PT/INR:  Lab Results  Component Value Date   INR 1.2 09/14/2019   INR 1.3 (H) 08/19/2019   INR 1.5 (H) 08/19/2019   ABG:  INR: Will add last result for INR, ABG once components are confirmed Will add last 4 CBG results once components are confirmed  Assessment/Plan:  1. CV - Slightly tachycardic. On  2.  Pulmonary - Patient with trach. 3. ESRD-Creatinine this am 6.76. Receiving HD this am. No active bleeding from left catheter site this am.  4.  Anemia of chronic disease-H and H this am decreased to 7.6 and 24.3 5. GI-NPO.Has Cor trak  and IR will place gastrostomy tube next week. Continue TFs 6. CBGs 194/189/187. On Insulin  Donielle M ZimmermanPA-C 09/30/2019,8:58 AM   Patient breathing comfortably with capped tracheostomy #4 [place] [facility] recommends leaving capped trach in place for few more days to make sure he is stable for decannulation Feeding tube placed to distal duodenum today and IR planning on percutaneous G-tube next week Sternal wounds appear stable  patient examined and medical record reviewed,agree with above note. Tharon Aquas Trigt III 09/30/2019

## 2019-09-30 NOTE — TOC Progression Note (Signed)
Transition of Care [facility]) - Progression Note    Patient Details  Name: Christian [name] MRN: [id number] Date of Birth: 03-29-1946  Transition of Care [facility]) CM/SW [place], [place] Phone Number: 09/30/2019, 3:44 PM  Clinical Narrative:     CSW gave bed offers to pts wife.   CSW will continue to follow.   Expected Discharge Plan: [place] Barriers to Discharge: Continued Medical Work up  Expected Discharge Plan and Services Expected Discharge Plan: [place] arrangements for the past 2 months: Single Family Home                                       Social Determinants of Health (SDOH) Interventions    Readmission Risk Interventions No flowsheet data found.  Emeterio Reeve, Latanya Presser, [place] Social Worker [phone]

## 2019-09-30 NOTE — Progress Notes (Signed)
Modified Barium Swallow Progress Note  Patient Details  Name: ABDULKADIR EMMANUEL MRN: [id number] Date of Birth: 08-02-1945  Today's Date: 09/30/2019  Modified Barium Swallow completed.  Full report located under Chart Review in the Imaging Section.  Brief recommendations include the following:  Clinical Impression  Pt presents with mild oropharyngeal dysphagia which resulted in decreased lingual coordination, premature spillage, and penetration of thin liquid.  Although intial trials appeared very good, there was penetration with later trials, which may have been due in part to fatigue.  Pt's positioning was very poor preventing visualization of laryngeal vestibule and vocal folds.  It was unclear if penetration proceeded to aspiration or if it was cleared during the swallow.  Pt was repositioned during the study to attempt to improve visibility without success. An oral diet cannot be recommended based on this study; however, it is not clear that PO intake is unsafe either.  Recommend further evaluation by FEES to improve visualization of laryngeal vestibule prior to proceeding with PEG placement.   Swallow Evaluation Recommendations       SLP Diet Recommendations: NPO                                Oswell Say E Atziry Baranski 09/30/2019,5:13 PM

## 2019-09-30 NOTE — Procedures (Signed)
Patient was seen on dialysis and the procedure was supervised.  BFR 400  Via TDC BP is  97/63. 2K bath.  No bleeding from the catheter site.  Plan for placement of feeding tube noted.  Discussed with the cardiothoracic surgery team.  Patient appears to be tolerating treatment well.   Christian [name] Christian [name] 09/30/2019

## 2019-10-01 LAB — GLUCOSE, CAPILLARY
Glucose-Capillary: 185 mg/dL — ABNORMAL HIGH (ref 70–99)
Glucose-Capillary: 189 mg/dL — ABNORMAL HIGH (ref 70–99)
Glucose-Capillary: 196 mg/dL — ABNORMAL HIGH (ref 70–99)
Glucose-Capillary: 143 mg/dL — ABNORMAL HIGH (ref 70–99)
Glucose-Capillary: 174 mg/dL — ABNORMAL HIGH (ref 70–99)

## 2019-10-01 LAB — RENAL FUNCTION PANEL
Albumin: 2.6 g/dL — ABNORMAL LOW (ref 3.5–5.0)
Anion gap: 17 — ABNORMAL HIGH (ref 5–15)
BUN: 83 mg/dL — ABNORMAL HIGH (ref 8–23)
CO2: 23 mmol/L (ref 22–32)
Calcium: 9.1 mg/dL (ref 8.9–10.3)
Chloride: 96 mmol/L — ABNORMAL LOW (ref 98–111)
Creatinine, Ser: 4.54 mg/dL — ABNORMAL HIGH (ref 0.61–1.24)
GFR calc Af Amer: 14 mL/min — ABNORMAL LOW (ref >60)
GFR calc non Af Amer: 12 mL/min — ABNORMAL LOW (ref >60)
Glucose, Bld: 195 mg/dL — ABNORMAL HIGH (ref 70–99)
Phosphorus: 6 mg/dL — ABNORMAL HIGH (ref 2.5–4.6)
Potassium: 3.9 mmol/L (ref 3.5–5.1)
Sodium: 136 mmol/L (ref 135–145)

## 2019-10-01 LAB — MAGNESIUM: Magnesium: 2.6 mg/dL — ABNORMAL HIGH (ref 1.7–2.4)

## 2019-10-01 MED ORDER — INSULIN DETEMIR 100 UNIT/ML ~~LOC~~ SOLN
12.0000 [IU] | Freq: Two times a day (BID) | SUBCUTANEOUS | Status: DC
  Administered 2019-10-01 – 2019-10-18 (×29): 12 [IU] via SUBCUTANEOUS
  Filled 2019-10-01 (×36): qty 0.12

## 2019-10-01 MED ORDER — ACETAMINOPHEN 160 MG/5ML PO SOLN
650.0000 mg | Freq: Four times a day (QID) | ORAL | Status: DC | PRN
  Administered 2019-10-14: 650 mg
  Filled 2019-10-01: qty 20.3

## 2019-10-01 MED ORDER — NYSTATIN 100000 UNIT/ML MT SUSP
5.0000 mL | Freq: Four times a day (QID) | OROMUCOSAL | Status: DC
  Administered 2019-10-01 – 2019-10-18 (×35): 500000 [IU] via ORAL
  Filled 2019-10-01 (×36): qty 5

## 2019-10-01 MED ORDER — SEVELAMER CARBONATE 0.8 G PO PACK
1.6000 g | PACK | Freq: Three times a day (TID) | ORAL | Status: DC
  Administered 2019-10-01 – 2019-10-11 (×17): 1.6 g
  Filled 2019-10-01 (×41): qty 2

## 2019-10-01 NOTE — Plan of Care (Signed)
  Problem: Education: Goal: Knowledge of General Education information will improve Description: Including pain rating scale, medication(s)/side effects and non-pharmacologic comfort measures Outcome: Progressing   Problem: Clinical Measurements: Goal: Ability to maintain clinical measurements within normal limits will improve Outcome: Progressing   Problem: Clinical Measurements: Goal: Cardiovascular complication will be avoided Outcome: Progressing   Problem: Nutrition: Goal: Adequate nutrition will be maintained Outcome: Progressing   Problem: Coping: Goal: Level of anxiety will decrease Outcome: Progressing   Problem: Pain Managment: Goal: General experience of comfort will improve Outcome: Progressing   Problem: Skin Integrity: Goal: Risk for impaired skin integrity will decrease Outcome: Progressing   Problem: Education: Goal: Knowledge of the prescribed therapeutic regimen will improve Outcome: Progressing   Problem: Clinical Measurements: Goal: Postoperative complications will be avoided or minimized Outcome: Progressing   Problem: Skin Integrity: Goal: Wound healing without signs and symptoms of infection Outcome: Progressing

## 2019-10-01 NOTE — Progress Notes (Addendum)
      [address]       [place] [address]             [phone]        17 Days Post-Op Procedure(s) (LRB): EVACUATION OF HEMATOMA (N/A)  Subjective: Patient resting and awakened.  Objective: Vital signs in last 24 hours: Temp:  [97.8 F (36.6 C)-99.2 F (37.3 C)] 98.7 F (37.1 C) (06/06 0748) Pulse Rate:  [105-115] 105 (06/06 0748) Cardiac Rhythm: Sinus tachycardia (06/06 0704) Resp:  [19-26] 24 (06/06 0748) BP: (82-142)/(47-72) 134/70 (06/06 0748) SpO2:  [94 %-100 %] 97 % (06/06 0748) FiO2 (%):  [21 %] 21 % (06/05 2046) Weight:  [77.8 kg-81.2 kg] 81.2 kg (06/06 0334)  Pre op weight 100.2 kg Current Weight  10/01/19 81.2 kg      Intake/Output from previous day: 06/05 0701 - 06/06 0700 In: 1205 [NG/GT:1205] Out: 2000    Physical Exam:  Cardiovascular: Slightly tachycardic Pulmonary: Clear Abdomen: Soft, non tender, bowel sounds present. Extremities: Wounds: Sternal wound clean and dry. Left radial artery wound with mild dehiscence,eshar/superifical skin necrosis (may need debridement)  Lab Results: CBC: Recent Labs    09/29/19 0756 09/30/19 0702  WBC 19.9* 18.7*  HGB 8.2* 7.6*  HCT 26.3* 24.3*  PLT 393 413*   BMET:  Recent Labs    09/30/19 0228 10/01/19 0510  NA 134* 136  K 4.9 3.9  CL 96* 96*  CO2 22 23  GLUCOSE 192* 195*  BUN 135* 83*  CREATININE 6.76* 4.54*  CALCIUM 9.0 9.1    PT/INR:  Lab Results  Component Value Date   INR 1.2 09/14/2019   INR 1.3 (H) 08/19/2019   INR 1.5 (H) 08/19/2019   ABG:  INR: Will add last result for INR, ABG once components are confirmed Will add last 4 CBG results once components are confirmed  Assessment/Plan:  1. CV - Slightly tachycardic. On Midodrine 5 mg tid and Amiodarone 200 mg 2.  Pulmonary - Patient with trach. Per CCM/pulmonary leave capped until stable for decannulation. 3. ESRD-Creatinine this am 4.54. Received HD yesterday. No active bleeding from left catheter  site this am.  4.  Anemia of chronic disease-H and H yesterday decreased to 7.6 and 24.3. On Aranesp and Ferric gluconate 5. GI-NPO.Has Cor trak and IR will place gastrostomy tube next week. Continue TFs. Swallow study done yesterday;results: An oral diet cannot be recommended based on this study; however, it is not clear that PO intake is unsafe either.  6. CBGs 196/185/189. On Insulin and will increase for better glucose control  Christian Sebring M ZimmermanPA-C 10/01/2019,8:19 AM

## 2019-10-01 NOTE — Progress Notes (Signed)
[facility] NEPHROLOGY PROGRESS NOTE  Assessment/ Plan: Pt is a 74 y.o. yo male is status post CABG and had a cardiac arrest due to tamponade, developed AKI requiring dialysis.  Previously the creatinine level was 1.22 in 06/2019.  #Acute kidney injury following cardiac cath, CABG, cardiogenic shock and cardiac arrest.  Started CRRT from 4/29-5/12 to manage volume.  He is now tolerating intermittent hemodialysis.   No sign of renal recovery so far. He will need arrangement for OP HD for AKI, trach is capped now.  Discussed with renal navigator. Status post right IJ TDC by IR on 5/25.  He had HD on 6/5 with 2 L UF, tolerated well.  Plan for next HD on 6/8.  No bleeding from the catheter site.  No permanent access because of AKI.  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  # Postoperative acute hypoxic respiratory failure on h/o COPD- reintubated 08/18/19 and likely aspiration.  Clinically doing well and now the tracheostomy is capped.  # Anemia of critical illness: Iron saturation 14% therefore starting IV iron with dialysis.  Continue ESA.    #CKD-MBD: Increase Renvela dose.  Monitor phosphorus level.  # A fib per primary.  #Dispo -probably to SNF. Plan for gastric feeding tube placement noted.  Subjective: Seen and examined at bedside.  No new event.  Denies nausea vomiting chest pain shortness of breath. Objective Vital signs in last 24 hours: Vitals:   09/30/19 2308 10/01/19 0334 10/01/19 0748 10/01/19 0900  BP: (!) 142/72 126/70 134/70   Pulse: (!) 110 (!) 108 (!) 105 (!) 103  Resp: (!) 22 (!) 24 (!) 24 19  Temp: 99.2 F (37.3 C) 99 F (37.2 C) 98.7 F (37.1 C)   TempSrc: Oral Oral Oral   SpO2: 99% 99% 97% 99%  Weight:  81.2 kg     Height:       Weight change: -1.3 kg  Intake/Output Summary (Last 24 hours) at 10/01/2019 1007 Last data filed at 10/01/2019 0800 Gross per 24 hour  Intake 1295 ml  Output 2000 ml  Net -705 ml       Labs: Basic Metabolic Panel: Recent Labs  Lab 09/29/19 0247 09/30/19 0228 10/01/19 0510  NA 133* 134* 136  K 4.5 4.9 3.9  CL 93* 96* 96*  CO2 24 22 23   GLUCOSE 175* 192* 195*  BUN 91* 135* 83*  CREATININE 4.77* 6.76* 4.54*  CALCIUM 9.0 9.0 9.1  PHOS 4.8* 7.0* 6.0*   Liver Function Tests: Recent Labs  Lab 09/29/19 0247 09/30/19 0228 10/01/19 0510  ALBUMIN 2.4* 2.4* 2.6*   No results for input(s): LIPASE, AMYLASE in the last 168 hours. No results for input(s): AMMONIA in the last 168 hours. CBC: Recent Labs  Lab 09/25/19 0229 09/25/19 0229 09/26/19 1116 09/29/19 0756 09/30/19 0702  WBC 15.7*   < > 18.5* 19.9* 18.7*  HGB 8.7*   < > 8.7* 8.2* 7.6*  HCT 27.2*   < > 27.1* 26.3* 24.3*  MCV 98.2  --  98.5 98.9 100.4*  PLT 300   < > 338 393 413*   < > = values in this interval not displayed.   Cardiac Enzymes: No results for input(s): CKTOTAL, CKMB, CKMBINDEX, TROPONINI in the last 168 hours. CBG: Recent Labs  Lab 09/30/19 1634 09/30/19 1937 09/30/19 2347 10/01/19 0357 10/01/19 0803  GLUCAP 191* 151* 196* 185* 189*    Iron Studies:  No results for input(s): IRON, TIBC, TRANSFERRIN, FERRITIN in the last 72 hours. Studies/Results: DG Abd Portable 1V  Result Date: 09/30/2019 CLINICAL DATA:  Feeding tube position EXAM: PORTABLE ABDOMEN - 1 VIEW COMPARISON:  08/30/2019 FINDINGS: A feeding tube is in place with tip in the fourth portion of the duodenum. The lower margin of a catheter projects over the right atrium, probably a dialysis catheter. Lower thoracic spondylosis. The visualized portion of the bowel gas pattern is unremarkable. IMPRESSION: 1. Feeding tube tip: 4th portion of the duodenum. 2. A catheter terminates in the right atrium from above.  Electronically Signed   By: Van Clines M.D.   On: 09/30/2019 13:26   DG Swallowing Func-Speech Pathology  Result Date: 09/30/2019 Objective Swallowing Evaluation: Type of Study: MBS-Modified Barium Swallow Study  Patient Details Name: Christian [name] MRN: [id number] Date of Birth: 05-27-1945 Today's Date: 09/30/2019 Time: SLP Start Time (ACUTE ONLY): 1400 -SLP Stop Time (ACUTE ONLY): 1416 SLP Time Calculation (min) (ACUTE ONLY): 16 min Past Medical History: Past Medical History: Diagnosis Date . BPH (benign prostatic hypertrophy)  . Coronary artery disease  . Diabetes mellitus without complication ([place])  . Dry eyes left . Hiatal hernia  . 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  LIMA TO LAD, RIMA TO PDA, RADIAL ARTERY TO CIRC AND SEQUENTIALLY TO OM1. 08/15/2019 . Hyperlipidemia  . Hypertension  . Incomplete bladder emptying  . Nocturia  . 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 SOB (shortness of breath) on exertion  Past Surgical History: Past Surgical History: Procedure Laterality Date . APPLICATION OF WOUND VAC N/A 09/22/4130  Procedure: APPLICATION OF WOUND VAC;  Surgeon: [name], MD;  Location: [place];  Service: Thoracic;  Laterality: N/A; . APPLICATION OF WOUND VAC  08/29/2019  Procedure: Wound Vac change;  Surgeon: [name], MD;  Location: [place] [facility];  Service: Open Heart Surgery;; . APPLICATION OF WOUND VAC N/A 4/40/1027  Procedure: Application Of Wound Vac;  Surgeon: Grace Curvin, MD;  Location: [place];  Service: Open Heart Surgery;  Laterality: N/A; . APPLICATION OF WOUND VAC N/A 2/53/6644  Procedure: APPLICATION OF ACELL, APPLICATION OF WOUND VAC USING PREVENA INCISIONAL  DRESSING;  Surgeon: Wallace Going, DO;  Location: [place];  Service: Plastics;  Laterality: N/A; . CARDIAC CATHETERIZATION   . CORONARY ARTERY BYPASS GRAFT N/A 08/15/2019   Procedure: CORONARY ARTERY BYPASS GRAFTING (CABG), x 4 using bilateral IMAs and left radial artery .  LIMA TO LAD, RIMA TO PDA, RADIAL ARTERY TO CIRC AND SEQUENTIALLY TO OM1.;  Surgeon: [name], MD;  Location: [place];  Service: Open Heart Surgery;  Laterality: N/A; . CORONARY STENT PLACEMENT  02/27/2014  distal rt/pd coronary       dr Einar Gip . CYSTO/ BLADDER BIOPSY'S/ CAUTHERIZATION  01-14-2004  DR Gaynelle Arabian . EXPLORATION POST OPERATIVE OPEN HEART N/A 08/16/2019  Procedure: Chest Closure S?P CABG WITH APPLICATION OF PREVENA  INCISIONAL WOUND VAC;  Surgeon: [name], MD;  Location: [facility];  Service: Open Heart Surgery;  Laterality: N/A; . EXPLORATION POST OPERATIVE OPEN HEART N/A 08/21/2019  Procedure: CHEST WASHOUT S/P OPEN CHEST;  Surgeon: [name], MD;  Location: [place];  Service:  Open Heart Surgery;  Laterality: N/A;  Open chest with Esmark dressing with Ioban sealant coverage. . EXPLORATION POST OPERATIVE OPEN HEART N/A 08/18/2019  Procedure: EXPLORATION POST OPERATIVE OPEN HEART (performed 04/23 on unit);  Surgeon: [name], MD;  Location: [place];  Service: Open Heart Surgery;  Laterality: N/A; . EXPLORATION POST OPERATIVE OPEN HEART N/A 08/24/2019  Procedure: CHEST WASHOUT POST OPERATIVE OPEN HEART;  Surgeon: [name], MD;  Location: [place];  Service: Open Heart Surgery;  Laterality: N/A; . EXPLORATION POST OPERATIVE OPEN HEART N/A 08/29/2019  Procedure: CHEST WOUND WASHOUT POST OPERATIVE OPEN HEART;  Surgeon: [name], MD;  Location: [place];  Service: Open Heart Surgery;  Laterality: N/A; . EXPLORATION POST OPERATIVE OPEN HEART N/A 09/04/2019  Procedure: MEDIASTINAL EXPLORATION WITH STERNAL WOUND IRRIGATION;  Surgeon: Grace Paula, MD;  Location: [place];  Service: Open Heart Surgery;  Laterality: N/A; . EXPLORATION POST OPERATIVE OPEN HEART N/A 09/14/2019  Procedure: EVACUATION OF HEMATOMA;  Surgeon: Grace Cristin, MD;  Location: [place];  Service: Open Heart  Surgery;  Laterality: N/A; . IR FLUORO GUIDE CV LINE LEFT  09/19/2019 . IR PATIENT EVAL TECH 0-60 MINS  09/29/2019 . IR US GUIDE VASC ACCESS LEFT  09/19/2019 . LAPAROSCOPIC LYSIS OF ADHESIONS N/A 09/06/2019  Procedure: LAPAROSCOPIC OMENTAL HARVEST;  Surgeon: Kinsinger, Arta Bruce, MD;  Location: [place];  Service: General;  Laterality: N/A; . LEFT HEART CATH AND CORONARY ANGIOGRAPHY N/A 08/10/2019  Procedure: LEFT HEART CATH AND CORONARY ANGIOGRAPHY;  Surgeon: Nigel Mormon, MD;  Location: [place] CV LAB;  Service: Cardiovascular;  Laterality: N/A; . LEFT HEART CATHETERIZATION WITH CORONARY ANGIOGRAM N/A 02/27/2014  Procedure: LEFT HEART CATHETERIZATION WITH CORONARY ANGIOGRAM;  Surgeon: Laverda Page, MD;  Location: [facility] CATH LAB;  Service: Cardiovascular;  Laterality: N/A; . MEDIASTINAL EXPLORATION N/A 09/06/2019  Procedure: MEDIASTINAL EXPLORATION;  Surgeon: Grace Kahlen, MD;  Location: [place];  Service: Thoracic;  Laterality: N/A; . PECTORALIS FLAP  09/06/2019  Procedure: Pectoralis ADVANCEMENT Flap;  Surgeon: Wallace Going, DO;  Location: [place];  Service: Plastics;; . PERCUTANEOUS CORONARY STENT INTERVENTION (PCI-S)  02/27/2014  Procedure: PERCUTANEOUS CORONARY STENT INTERVENTION (PCI-S);  Surgeon: Laverda Page, MD;  Location: [facility] C[facility] LAB;  Service: Cardiovascular;;  rt PDA  3.0/28mm Promus stent . RADIAL ARTERY HARVEST Left 08/15/2019  Procedure: Radial Artery Harvest;  Surgeon: [name], MD;  Location: [place];  Service: Open Heart Surgery;  Laterality: Left; . RIB PLATING N/A 09/06/2019  Procedure: STERNAL PLATING;  Surgeon: Grace Adante, MD;  Location: [place];  Service: Thoracic;  Laterality: N/A; . TEE WITHOUT CARDIOVERSION N/A 08/15/2019  Procedure: TRANSESOPHAGEAL ECHOCARDIOGRAM (TEE);  Surgeon: [name], MD;  Location: [place];  Service: Open Heart Surgery;  Laterality: N/A; . TRACHEOSTOMY TUBE PLACEMENT  08/29/2019  Procedure: Tracheostomy;  Surgeon: [name],  MD;  Location: [place] [facility];  Service: Open Heart Surgery;; . TRANSURETHRAL RESECTION OF PROSTATE  04/04/2012  Procedure: TRANSURETHRAL RESECTION OF THE PROSTATE WITH GYRUS INSTRUMENTS;  Surgeon: Ailene Rud, MD;  Location: [place];  Service: Urology;  Laterality: N/A; . TRANSURETHRAL RESECTION OF PROSTATE N/A 09/27/2014  Procedure: TRANSURETHRAL RESECTION OF THE PROSTATE ;  Surgeon: Carolan Clines, MD;  Location: [facility];  Service: Urology;  Laterality: N/A; . UPPER GASTROINTESTINAL ENDOSCOPY   HPI: 74 yo admitted with NSTEMI 4/15, RLL PE, 4/20 CABG x 4 remained open with wound VAC with repeated washouts. 4/23 cardiac arrest, 4/29  initiated CRRT, vent s/p trach. 5/10 additional sternal washout unable to close.  5/12 sternal plating with pectoralis flap and omental harvest to mediastinum off CRRT with iHD started. Chest wound closed. Neurology consulted for left sided weakness- CTH neg 09/08/19. 5/20 pt with emergent hematoma evacuation and transition to trach collar; 5/28 downsized to #4 cuffless. PMHx: BPH, CAD, DM, HLD, HTN  Subjective: Awake, alert Assessment / Plan / Recommendation CHL IP CLINICAL IMPRESSIONS 09/30/2019 Clinical Impression Pt presents with mild oropharyngeal dysphagia which resulted in decreased lingual coordination, premature spillage, and penetration of thin liquid.  Although intial trials appeared very good, there was penetration with later trials, which may have been due in part to fatigue.  Pt's positioning was very poor preventing visualization of laryngeal vestibule and vocal folds.  It was unclear if penetration proceeded to aspiration or if it was cleared during the swallow.  Pt was repositioned during the study to attempt to improve visibility without success. An oral diet cannot be recommended based on this study; however, it is not clear that PO intake is unsafe either.  Recommend further evaluation by FEES to improve visualization of laryngeal vestibule prior to  proceeding wiht PEG placement. SLP Visit Diagnosis Dysphagia, oropharyngeal phase (R13.12) Attention and concentration deficit following -- Frontal lobe and executive function deficit following -- Impact on safety and function Mild aspiration risk   CHL IP TREATMENT RECOMMENDATION 09/30/2019 Treatment Recommendations F/U MBS in --- days (Comment)   Prognosis 09/26/2019 Prognosis for Safe Diet Advancement Good Barriers to Reach Goals -- Barriers/Prognosis Comment -- CHL IP DIET RECOMMENDATION 09/30/2019 SLP Diet Recommendations NPO Liquid Administration via -- Medication Administration -- Compensations -- Postural Changes --   No flowsheet data found.  CHL IP FOLLOW UP RECOMMENDATIONS 09/26/2019 Follow up Recommendations 24 hour supervision/assistance   CHL IP FREQUENCY AND DURATION 09/26/2019 Speech Therapy Frequency (ACUTE ONLY) min 2x/week Treatment Duration 2 weeks      CHL IP ORAL PHASE 09/30/2019 Oral Phase Impaired Oral - Pudding Teaspoon -- Oral - Pudding Cup -- Oral - Honey Teaspoon -- Oral - Honey Cup -- Oral - Nectar Teaspoon -- Oral - Nectar Cup -- Oral - Nectar Straw -- Oral - Thin Teaspoon -- Oral - Thin Cup Premature spillage;Lingual/palatal residue Oral - Thin Straw Premature spillage;Lingual/palatal residue Oral - Puree Lingual/palatal residue;Premature spillage;Lingual pumping;Delayed oral transit Oral - Mech Soft -- Oral - Regular Lingual pumping;Lingual/palatal residue;Delayed oral transit Oral - Multi-Consistency -- Oral - Pill -- Oral Phase - Comment --  CHL IP PHARYNGEAL PHASE 09/30/2019 Pharyngeal Phase Impaired Pharyngeal- Pudding Teaspoon -- Pharyngeal -- Pharyngeal- Pudding Cup -- Pharyngeal -- Pharyngeal- Honey Teaspoon -- Pharyngeal -- Pharyngeal- Honey Cup -- Pharyngeal -- Pharyngeal- Nectar Teaspoon -- Pharyngeal -- Pharyngeal- Nectar Cup -- Pharyngeal -- Pharyngeal- Nectar Straw -- Pharyngeal -- Pharyngeal- Thin Teaspoon -- Pharyngeal -- Pharyngeal- Thin Cup Delayed swallow initiation-pyriform  sinuses;Reduced epiglottic inversion;Reduced tongue base retraction Pharyngeal Material does not enter airway Pharyngeal- Thin Straw Delayed swallow initiation-pyriform sinuses;Reduced epiglottic inversion;Reduced airway/laryngeal closure;Reduced laryngeal elevation;Reduced anterior laryngeal mobility;Penetration/Aspiration before swallow;Penetration/Aspiration during swallow;Pharyngeal residue - valleculae Pharyngeal Material enters airway, remains ABOVE vocal cords and not ejected out;Material enters airway, remains ABOVE vocal cords then ejected out Pharyngeal- Puree Delayed swallow initiation-vallecula;Reduced tongue base retraction;Pharyngeal residue - valleculae Pharyngeal Material does not enter airway Pharyngeal- Mechanical Soft -- Pharyngeal -- Pharyngeal- Regular Delayed swallow initiation-vallecula;Reduced tongue base retraction;Pharyngeal residue - valleculae Pharyngeal Material does not enter airway Pharyngeal- Multi-consistency -- Pharyngeal -- Pharyngeal- Pill -- Pharyngeal -- Pharyngeal Comment --  No flowsheet data found. Celedonio Savage, MA, CCC-SLP Acute Rehabilitation Services Office: [phone] 09/30/2019, 5:14 PM              IR PATIENT EVAL TECH 0-60 MINS  Result Date: 09/29/2019 Karenann Cai     09/29/2019 11:15 AM IR notified of patient with ongoing bleeding from his catheter site.  Patient brought to IR. Held pressure for 10 minutes with no oozing noted.  Injected trach with gel foam and held pressure for an additional 10 minutes.  No oozing noted after observation for 5 minutes.  Dressed catheter.  G-tube consult to follow.  Brynda Greathouse, MS RD PA-C 10:50 AM   Medications: Infusions: . sodium chloride Stopped (09/14/19 0802)  . feeding supplement (NEPRO CARB STEADY) 45 mL/hr at 09/30/19 1900  . ferric gluconate (FERRLECIT/NULECIT) IV 125 mg (09/30/19 1528)  . lactated ringers 20 mL/hr at 08/20/19 1200    Scheduled Medications: .  stroke: mapping our early stages of  recovery book   Does not apply Once  . amiodarone  200 mg Per Tube Daily  . arformoterol  15 mcg Nebulization BID  . aspirin  81 mg Per Tube Daily  . atorvastatin  40 mg Per Tube Daily  . bisacodyl  10 mg Rectal Daily  . chlorhexidine  15 mL Mouth Rinse BID  . Chlorhexidine Gluconate Cloth  6 each Topical Q0600  . clonazePAM  0.5 mg Per Tube BID  . [START ON 10/03/2019] darbepoetin (ARANESP) injection - DIALYSIS  100 mcg Intravenous Q Tue-HD  . feeding supplement (PRO-STAT SUGAR FREE 64)  60 mL Per Tube TID  . insulin aspart  0-15 Units Subcutaneous Q4H  . insulin detemir  12 Units Subcutaneous BID  . mouth rinse  15 mL Mouth Rinse q12n4p  . midodrine  5 mg Per Tube TID WC  . multivitamin  1 tablet Oral QHS  . nystatin  5 mL Oral QID  . pantoprazole sodium  40 mg Per Tube Daily  . revefenacin  175 mcg Nebulization Daily  . sertraline  25 mg Per Tube Daily  . sevelamer carbonate  0.8 g Per Tube TID WC  . trospium  20 mg Per Tube BID    have reviewed scheduled and prn medications.  Physical Exam: General:NAD, lying on bed, the trach is capped. Heart:RRR, s1s2 nl Lungs: Clear bilateral, no wheezing Abdomen:soft, Non-tender, non-distended Extremities:No edema Dialysis Access: Right IJ TDC, clean site with no evidence of bleeding. Graycen Degan Prasad Derinda Bartus 10/01/2019,10:07 AM  LOS: 53 days  Pager: [phone]

## 2019-10-02 LAB — RENAL FUNCTION PANEL
Albumin: 2.4 g/dL — ABNORMAL LOW (ref 3.5–5.0)
Anion gap: 17 — ABNORMAL HIGH (ref 5–15)
BUN: 146 mg/dL — ABNORMAL HIGH (ref 8–23)
CO2: 24 mmol/L (ref 22–32)
Calcium: 8.9 mg/dL (ref 8.9–10.3)
Chloride: 95 mmol/L — ABNORMAL LOW (ref 98–111)
Creatinine, Ser: 6.2 mg/dL — ABNORMAL HIGH (ref 0.61–1.24)
GFR calc Af Amer: 10 mL/min — ABNORMAL LOW (ref >60)
GFR calc non Af Amer: 8 mL/min — ABNORMAL LOW (ref >60)
Glucose, Bld: 186 mg/dL — ABNORMAL HIGH (ref 70–99)
Phosphorus: 7.5 mg/dL — ABNORMAL HIGH (ref 2.5–4.6)
Potassium: 4.2 mmol/L (ref 3.5–5.1)
Sodium: 136 mmol/L (ref 135–145)

## 2019-10-02 LAB — CBC
HCT: 24.1 % — ABNORMAL LOW (ref 39.0–52.0)
Hemoglobin: 7.5 g/dL — ABNORMAL LOW (ref 13.0–17.0)
MCH: 30.9 pg (ref 26.0–34.0)
MCHC: 31.1 g/dL (ref 30.0–36.0)
MCV: 99.2 fL (ref 80.0–100.0)
Platelets: 430 10*3/uL — ABNORMAL HIGH (ref 150–400)
RBC: 2.43 MIL/uL — ABNORMAL LOW (ref 4.22–5.81)
RDW: 18.3 % — ABNORMAL HIGH (ref 11.5–15.5)
WBC: 17.9 10*3/uL — ABNORMAL HIGH (ref 4.0–10.5)
nRBC: 0 % (ref 0.0–0.2)

## 2019-10-02 LAB — GLUCOSE, CAPILLARY
Glucose-Capillary: 167 mg/dL — ABNORMAL HIGH (ref 70–99)
Glucose-Capillary: 172 mg/dL — ABNORMAL HIGH (ref 70–99)
Glucose-Capillary: 179 mg/dL — ABNORMAL HIGH (ref 70–99)
Glucose-Capillary: 182 mg/dL — ABNORMAL HIGH (ref 70–99)
Glucose-Capillary: 186 mg/dL — ABNORMAL HIGH (ref 70–99)
Glucose-Capillary: 188 mg/dL — ABNORMAL HIGH (ref 70–99)
Glucose-Capillary: 191 mg/dL — ABNORMAL HIGH (ref 70–99)
Glucose-Capillary: 199 mg/dL — ABNORMAL HIGH (ref 70–99)

## 2019-10-02 LAB — PREPARE RBC (CROSSMATCH)

## 2019-10-02 LAB — MAGNESIUM: Magnesium: 2.9 mg/dL — ABNORMAL HIGH (ref 1.7–2.4)

## 2019-10-02 MED ORDER — RESOURCE THICKENUP CLEAR PO POWD
ORAL | Status: DC | PRN
  Filled 2019-10-02: qty 125

## 2019-10-02 MED ORDER — SODIUM CHLORIDE 0.9% IV SOLUTION: Freq: Once | INTRAVENOUS | Status: DC

## 2019-10-02 NOTE — Progress Notes (Signed)
Occupational Therapy Treatment Patient Details Name: Christian [name] MRN: [id number] DOB: 05-31-1945 Today's Date: 10/02/2019    History of present illness 74 yo admitted with NSTEMI 4/15, RLL PE, 4/20 CABG x 4 remained open with wound VAC with repeated washouts. 4/23 cardiac arrest, 4/ 29 initiated CRRT, vent s/p trach. 5/10 additional sternal washout unable to close.  5/12 sternal plating with pectoralis flap and omental harvest to mediastinum off CRRT with iHD started. Chest wound closed. Neurology consulted for left sided weakness- CTH neg 09/08/19. 5/20 pt with emergent hematoma evacuation and transition to trach collar. 5/24 down sized to 6 trach. PMHx: BPH, CAD, DM, HLD, HTN   OT comments  Pt agreeable to working with OT with min encouragement. He continues to remain with weakness in bil UE/LEs but does tolerate stretching and AAROM to all extremities within available limits, activating extremities/working with therapist as able. Pt with minimal attempts to communicate/respond to therapist this date, often using only head nods. Assisted with repositioning LEs/PRAFO boots to promote better alignment as LEs noted resting in external/internal rotation. VSS throughout. Continue to recommend post acute rehab at time of discharge. Will follow while acutely admitted.    Follow Up Recommendations  LTACH;SNF;Supervision/Assistance - 24 hour    Equipment Recommendations  Other (comment)(TBD)          Precautions / Restrictions Precautions Precautions: Fall;Sternal Precaution Comments: trach, sternal flap Restrictions Other Position/Activity Restrictions: sternal precautions              ADL either performed or assessed with clinical judgement   ADL Overall ADL's : Needs assistance/impaired Eating/Feeding: NPO Eating/Feeding Details (indicate cue type and reason): cortrak                                   General ADL Comments: pt agreeable to bed level activity  including stretching/ROM to bil UE/LE (increased stiffness today), repositioned for comfort and edema management                       Cognition Arousal/Alertness: Awake/alert;Lethargic Behavior During Therapy: Flat affect Overall Cognitive Status: Difficult to assess Area of Impairment: Problem solving;Following commands;Attention                   Current Attention Level: Focused   Following Commands: Follows one step commands inconsistently     Problem Solving: Slow processing;Requires verbal cues;Requires tactile cues General Comments: minimally responsive/engaging today         Exercises Exercises: General Lower Extremity;Other exercises;General Upper Extremity Total Joint Exercises Ankle Circles/Pumps: AROM;Both;5 reps;Seated General Exercises - Upper Extremity Elbow Flexion: AROM;AAROM;Both;10 reps;Supine Elbow Extension: AROM;AAROM;Both;10 reps;Supine General Exercises - Lower Extremity Hip Flexion/Marching: AAROM;Both;10 reps;Supine Other Exercises Other Exercises: lap slides into forward shoulder flexion (within limitations of sternal precautions), AAROM bil UE x10  Other Exercises: gentle passive stretching to L digits due to decreased/limited AROM    Shoulder Instructions       General Comments VSS    Pertinent Vitals/ Pain       Pain Assessment: Faces Faces Pain Scale: Hurts little more Pain Location: extremities with certain movements/ROM, nose with movement of cortrak  Pain Descriptors / Indicators: Discomfort;Grimacing Pain Intervention(s): Repositioned;Monitored during session  Home Living  Prior Functioning/Environment              Frequency  Min 2X/week        Progress Toward Goals  OT Goals(current goals can now be found in the care plan section)  Progress towards OT goals: Progressing toward goals  Acute Rehab OT Goals Patient Stated Goal: return to  independence OT Goal Formulation: With patient Time For Goal Achievement: 10/02/19 Potential to Achieve Goals: Fair ADL Goals Pt Will Perform Grooming: with set-up;sitting Pt Will Transfer to Toilet: with mod assist;with +2 assist;squat pivot transfer Pt/caregiver will Perform Home Exercise Program: Increased strength;Increased ROM;Both right and left upper extremity;With written HEP provided Additional ADL Goal #1: Pt will increase to modA for bed mobility as precursor for OOB ADL and mobility. Additional ADL Goal #2: Pt will complete x7 mins of ADL task with 1 rest break in order to increase activity tolerance. Additional ADL Goal #3: Pt will follow (2) multi step commands with minimal cues using visual perception activities in order to increase awareness of possible visual deficits.  Plan Discharge plan needs to be updated    Co-evaluation                 AM-PAC OT "6 Clicks" Daily Activity     Outcome Measure   Help from another person eating meals?: Total Help from another person taking care of personal grooming?: A Lot Help from another person toileting, which includes using toliet, bedpan, or urinal?: Total Help from another person bathing (including washing, rinsing, drying)?: Total Help from another person to put on and taking off regular upper body clothing?: A Lot Help from another person to put on and taking off regular lower body clothing?: Total 6 Click Score: 8    End of Session    OT Visit Diagnosis: Unsteadiness on feet (R26.81);Muscle weakness (generalized) (M62.81);Pain Pain - Right/Left: Left Pain - part of body: Arm   Activity Tolerance Patient tolerated treatment well   Patient Left in bed;with call bell/phone within reach;with bed alarm set   Nurse Communication Mobility status        Time: 7867-6720 OT Time Calculation (min): 22 min  Charges: OT General Charges $OT Visit: 1 Visit OT Treatments $Therapeutic Activity: 8-22 mins  Lou Cal, OT Acute Rehabilitation Services Pager [phone] Office [phone]    [name] 10/02/2019, 4:34 PM

## 2019-10-02 NOTE — Progress Notes (Signed)
[place]       [employer] [id number]             [phone]      18 Days Post-Op Procedure(s) (LRB): EVACUATION OF HEMATOMA (N/A) Subjective: Says he didn't sleep well, some sternal discomfort  Objective: Vital signs in last 24 hours: Temp:  [98 F (36.7 C)-99.5 F (37.5 C)] 98.5 F (36.9 C) (06/07 0720) Pulse Rate:  [97-105] 103 (06/07 0720) Cardiac Rhythm: Sinus tachycardia (06/07 0703) Resp:  [16-24] 19 (06/07 0720) BP: (121-142)/(59-70) 142/70 (06/07 0720) SpO2:  [97 %-100 %] 99 % (06/07 0720) FiO2 (%):  [21 %] 21 % (06/06 1458) Weight:  [84.5 kg] 84.5 kg (06/07 0325)  Hemodynamic parameters for last 24 hours:    Intake/Output from previous day: 06/06 0701 - 06/07 0700 In: 953.8 [NG/GT:953.8] Out: 0  Intake/Output this shift: No intake/output data recorded.  General appearance: alert, cooperative, fatigued and no distress Heart: regular rate and rhythm Lungs: fairly clear anteriorly, dim in lower firlds Abdomen: soft, nontender Extremities: no edema Wound: dressings CDI  Lab Results: Recent Labs    09/30/19 0702 10/02/19 0149  WBC 18.7* 17.9*  HGB 7.6* 7.5*  HCT 24.3* 24.1*  PLT 413* 430*   BMET:  Recent Labs    10/01/19 0510 10/02/19 0149  NA 136 136  K 3.9 4.2  CL 96* 95*  CO2 23 24  GLUCOSE 195* 186*  BUN 83* 146*  CREATININE 4.54* 6.20*  CALCIUM 9.1 8.9    PT/INR: No results for input(s): LABPROT, INR in the last 72 hours. ABG    Component Value Date/Time   PHART 7.481 (H) 09/07/2019 0029   HCO3 23.3 09/07/2019 0029   TCO2 24 09/07/2019 0029   ACIDBASEDEF 5.0 (H) 08/18/2019 2341   O2SAT 99.0 09/07/2019 0029   CBG (last 3)  Recent Labs    10/02/19 0021 10/02/19 0106 10/02/19 0327  GLUCAP 191* 199* 172*    Meds Scheduled Meds: .  stroke: mapping our early stages of recovery book   Does not apply Once  . amiodarone  200 mg Per Tube Daily  . arformoterol  15 mcg Nebulization BID  . aspirin  81 mg  Per Tube Daily  . atorvastatin  40 mg Per Tube Daily  . bisacodyl  10 mg Rectal Daily  . chlorhexidine  15 mL Mouth Rinse BID  . Chlorhexidine Gluconate Cloth  6 each Topical Q0600  . clonazePAM  0.5 mg Per Tube BID  . [START ON 10/03/2019] darbepoetin (ARANESP) injection - DIALYSIS  100 mcg Intravenous Q Tue-HD  . feeding supplement (PRO-STAT SUGAR FREE 64)  60 mL Per Tube TID  . insulin aspart  0-15 Units Subcutaneous Q4H  . insulin detemir  12 Units Subcutaneous BID  . mouth rinse  15 mL Mouth Rinse q12n4p  . midodrine  5 mg Per Tube TID WC  . multivitamin  1 tablet Oral QHS  . nystatin  5 mL Oral QID  . pantoprazole sodium  40 mg Per Tube Daily  . revefenacin  175 mcg Nebulization Daily  . sertraline  25 mg Per Tube Daily  . sevelamer carbonate  1.6 g Per Tube TID WC  . trospium  20 mg Per Tube BID   Continuous Infusions: . sodium chloride Stopped (09/14/19 0802)  . feeding supplement (NEPRO CARB STEADY) 1,000 mL (10/01/19 1747)  . ferric gluconate (FERRLECIT/NULECIT) IV 125 mg (09/30/19 1528)  . lactated ringers 20 mL/hr  at 08/20/19 1200   PRN Meds:.acetaminophen (TYLENOL) oral liquid 160 mg/5 mL, albuterol, dextrose, Gerhardt's butt cream, guaiFENesin, hydrALAZINE, hydrocortisone cream, HYDROmorphone (DILAUDID) injection, ondansetron (ZOFRAN) IV, oxyCODONE, phenol, sennosides, sodium chloride flush  Xrays DG Abd Portable 1V  Result Date: 09/30/2019 CLINICAL DATA:  Feeding tube position EXAM: PORTABLE ABDOMEN - 1 VIEW COMPARISON:  08/30/2019 FINDINGS: A feeding tube is in place with tip in the fourth portion of the duodenum. The lower margin of a catheter projects over the right atrium, probably a dialysis catheter. Lower thoracic spondylosis. The visualized portion of the bowel gas pattern is unremarkable. IMPRESSION: 1. Feeding tube tip: 4th portion of the duodenum. 2. A catheter terminates in the right atrium from above. Electronically Signed   By: Van Clines M.D.   On:  09/30/2019 13:26   DG Swallowing Func-Speech Pathology  Result Date: 09/30/2019 Objective Swallowing Evaluation: Type of Study: MBS-Modified Barium Swallow Study  Patient Details Name: NAHEEM MOSCO MRN: [id number] Date of Birth: 1945-11-19 Today's Date: 09/30/2019 Time: SLP Start Time (ACUTE ONLY): 1400 -SLP Stop Time (ACUTE ONLY): 1416 SLP Time Calculation (min) (ACUTE ONLY): 16 min Past Medical History: Past Medical History: Diagnosis Date . BPH (benign prostatic hypertrophy)  . Coronary artery disease  . Diabetes mellitus without complication ([place])  . Dry eyes left . Hiatal hernia  . 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  LIMA TO LAD, RIMA TO PDA, RADIAL ARTERY TO CIRC AND SEQUENTIALLY TO OM1. 08/15/2019 . Hyperlipidemia  . Hypertension  . Incomplete bladder emptying  . Nocturia  . 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 SOB (shortness of breath) on exertion  Past Surgical History: Past Surgical History: Procedure Laterality Date . APPLICATION OF WOUND VAC N/A 8/34/1962  Procedure: APPLICATION OF WOUND VAC;  Surgeon: [name], MD;  Location: [place];  Service: Thoracic;  Laterality: N/A; . APPLICATION OF WOUND VAC  08/29/2019  Procedure: Wound Vac change;  Surgeon: [name], MD;  Location: [place] [facility];  Service: Open Heart Surgery;; . APPLICATION OF WOUND VAC N/A 2/29/7989  Procedure: Application Of Wound Vac;  Surgeon: Grace Joby, MD;  Location: [place];  Service: Open Heart Surgery;  Laterality: N/A; . APPLICATION OF WOUND VAC N/A 06/08/9415  Procedure: APPLICATION OF ACELL, APPLICATION OF WOUND VAC USING PREVENA INCISIONAL  DRESSING;  Surgeon: Wallace Going, DO;  Location: [place];  Service: Plastics;  Laterality: N/A; . CARDIAC CATHETERIZATION   . CORONARY ARTERY BYPASS GRAFT N/A 08/15/2019  Procedure: CORONARY ARTERY BYPASS GRAFTING (CABG), x 4 using  bilateral IMAs and left radial artery .  LIMA TO LAD, RIMA TO PDA, RADIAL ARTERY TO CIRC AND SEQUENTIALLY TO OM1.;  Surgeon: [name], MD;  Location: [place];  Service: Open Heart Surgery;  Laterality: N/A; . CORONARY STENT PLACEMENT  02/27/2014  distal rt/pd coronary       dr Einar Gip . CYSTO/ BLADDER BIOPSY'S/ CAUTHERIZATION  01-14-2004  DR Gaynelle Arabian . EXPLORATION POST OPERATIVE OPEN HEART N/A 08/16/2019  Procedure: Chest Closure S?P CABG WITH APPLICATION OF PREVENA  INCISIONAL WOUND VAC;  Surgeon: [name], MD;  Location: [facility];  Service: Open Heart Surgery;  Laterality: N/A; . EXPLORATION POST OPERATIVE OPEN HEART N/A 08/21/2019  Procedure: CHEST WASHOUT S/P OPEN CHEST;  Surgeon: [name], MD;  Location: [place];  Service: Open Heart  Surgery;  Laterality: N/A;  Open chest with Esmark dressing with Ioban sealant coverage. . EXPLORATION POST OPERATIVE OPEN HEART N/A 08/18/2019  Procedure: EXPLORATION POST OPERATIVE OPEN HEART (performed 04/23 on unit);  Surgeon: [name], MD;  Location: [place];  Service: Open Heart Surgery;  Laterality: N/A; . EXPLORATION POST OPERATIVE OPEN HEART N/A 08/24/2019  Procedure: CHEST WASHOUT POST OPERATIVE OPEN HEART;  Surgeon: [name], MD;  Location: [place];  Service: Open Heart Surgery;  Laterality: N/A; . EXPLORATION POST OPERATIVE OPEN HEART N/A 08/29/2019  Procedure: CHEST WOUND WASHOUT POST OPERATIVE OPEN HEART;  Surgeon: [name], MD;  Location: [place];  Service: Open Heart Surgery;  Laterality: N/A; . EXPLORATION POST OPERATIVE OPEN HEART N/A 09/04/2019  Procedure: MEDIASTINAL EXPLORATION WITH STERNAL WOUND IRRIGATION;  Surgeon: Grace Zayaan, MD;  Location: [place];  Service: Open Heart Surgery;  Laterality: N/A; . EXPLORATION POST OPERATIVE OPEN HEART N/A 09/14/2019  Procedure: EVACUATION OF HEMATOMA;  Surgeon: Grace Haziel, MD;  Location: [place];  Service: Open Heart Surgery;  Laterality: N/A; . IR FLUORO GUIDE CV LINE LEFT   09/19/2019 . IR PATIENT EVAL TECH 0-60 MINS  09/29/2019 . IR US GUIDE VASC ACCESS LEFT  09/19/2019 . LAPAROSCOPIC LYSIS OF ADHESIONS N/A 09/06/2019  Procedure: LAPAROSCOPIC OMENTAL HARVEST;  Surgeon: Kinsinger, Arta Bruce, MD;  Location: [place];  Service: General;  Laterality: N/A; . LEFT HEART CATH AND CORONARY ANGIOGRAPHY N/A 08/10/2019  Procedure: LEFT HEART CATH AND CORONARY ANGIOGRAPHY;  Surgeon: Nigel Mormon, MD;  Location: [place] CV LAB;  Service: Cardiovascular;  Laterality: N/A; . LEFT HEART CATHETERIZATION WITH CORONARY ANGIOGRAM N/A 02/27/2014  Procedure: LEFT HEART CATHETERIZATION WITH CORONARY ANGIOGRAM;  Surgeon: Laverda Page, MD;  Location: [facility] CATH LAB;  Service: Cardiovascular;  Laterality: N/A; . MEDIASTINAL EXPLORATION N/A 09/06/2019  Procedure: MEDIASTINAL EXPLORATION;  Surgeon: Grace Jacobe, MD;  Location: [place];  Service: Thoracic;  Laterality: N/A; . PECTORALIS FLAP  09/06/2019  Procedure: Pectoralis ADVANCEMENT Flap;  Surgeon: Wallace Going, DO;  Location: [place];  Service: Plastics;; . PERCUTANEOUS CORONARY STENT INTERVENTION (PCI-S)  02/27/2014  Procedure: PERCUTANEOUS CORONARY STENT INTERVENTION (PCI-S);  Surgeon: Laverda Page, MD;  Location: [facility] CATH LAB;  Service: Cardiovascular;;  rt PDA  3.0/28mm Promus stent . RADIAL ARTERY HARVEST Left 08/15/2019  Procedure: Radial Artery Harvest;  Surgeon: [name], MD;  Location: [place];  Service: Open Heart Surgery;  Laterality: Left; . RIB PLATING N/A 09/06/2019  Procedure: STERNAL PLATING;  Surgeon: Grace Gilmore, MD;  Location: [place];  Service: Thoracic;  Laterality: N/A; . TEE WITHOUT CARDIOVERSION N/A 08/15/2019  Procedure: TRANSESOPHAGEAL ECHOCARDIOGRAM (TEE);  Surgeon: [name], MD;  Location: [place];  Service: Open Heart Surgery;  Laterality: N/A; . TRACHEOSTOMY TUBE PLACEMENT  08/29/2019  Procedure: Tracheostomy;  Surgeon: [name], MD;  Location: [place] [facility];  Service: Open Heart Surgery;; .  TRANSURETHRAL RESECTION OF PROSTATE  04/04/2012  Procedure: TRANSURETHRAL RESECTION OF THE PROSTATE WITH GYRUS INSTRUMENTS;  Surgeon: Ailene Rud, MD;  Location: [place];  Service: Urology;  Laterality: N/A; . TRANSURETHRAL RESECTION OF PROSTATE N/A 09/27/2014  Procedure: TRANSURETHRAL RESECTION OF THE PROSTATE ;  Surgeon: Carolan Clines, MD;  Location: [facility];  Service: Urology;  Laterality: N/A; . UPPER GASTROINTESTINAL ENDOSCOPY   HPI: 74 yo admitted with NSTEMI 4/15, RLL PE, 4/20 CABG x 4 remained open with wound VAC with repeated washouts. 4/23 cardiac arrest, 4/29 initiated CRRT,  vent s/p trach. 5/10 additional sternal washout unable to close.  5/12 sternal plating with pectoralis flap and omental harvest to mediastinum off CRRT with iHD started. Chest wound closed. Neurology consulted for left sided weakness- CTH neg 09/08/19. 5/20 pt with emergent hematoma evacuation and transition to trach collar; 5/28 downsized to #4 cuffless. PMHx: BPH, CAD, DM, HLD, HTN  Subjective: Awake, alert Assessment / Plan / Recommendation CHL IP CLINICAL IMPRESSIONS 09/30/2019 Clinical Impression Pt presents with mild oropharyngeal dysphagia which resulted in decreased lingual coordination, premature spillage, and penetration of thin liquid.  Although intial trials appeared very good, there was penetration with later trials, which may have been due in part to fatigue.  Pt's positioning was very poor preventing visualization of laryngeal vestibule and vocal folds.  It was unclear if penetration proceeded to aspiration or if it was cleared during the swallow.  Pt was repositioned during the study to attempt to improve visibility without success. An oral diet cannot be recommended based on this study; however, it is not clear that PO intake is unsafe either.  Recommend further evaluation by FEES to improve visualization of laryngeal vestibule prior to proceeding wiht PEG placement. SLP Visit Diagnosis  Dysphagia, oropharyngeal phase (R13.12) Attention and concentration deficit following -- Frontal lobe and executive function deficit following -- Impact on safety and function Mild aspiration risk   CHL IP TREATMENT RECOMMENDATION 09/30/2019 Treatment Recommendations F/U MBS in --- days (Comment)   Prognosis 09/26/2019 Prognosis for Safe Diet Advancement Good Barriers to Reach Goals -- Barriers/Prognosis Comment -- CHL IP DIET RECOMMENDATION 09/30/2019 SLP Diet Recommendations NPO Liquid Administration via -- Medication Administration -- Compensations -- Postural Changes --   No flowsheet data found.  CHL IP FOLLOW UP RECOMMENDATIONS 09/26/2019 Follow up Recommendations 24 hour supervision/assistance   CHL IP FREQUENCY AND DURATION 09/26/2019 Speech Therapy Frequency (ACUTE ONLY) min 2x/week Treatment Duration 2 weeks      CHL IP ORAL PHASE 09/30/2019 Oral Phase Impaired Oral - Pudding Teaspoon -- Oral - Pudding Cup -- Oral - Honey Teaspoon -- Oral - Honey Cup -- Oral - Nectar Teaspoon -- Oral - Nectar Cup -- Oral - Nectar Straw -- Oral - Thin Teaspoon -- Oral - Thin Cup Premature spillage;Lingual/palatal residue Oral - Thin Straw Premature spillage;Lingual/palatal residue Oral - Puree Lingual/palatal residue;Premature spillage;Lingual pumping;Delayed oral transit Oral - Mech Soft -- Oral - Regular Lingual pumping;Lingual/palatal residue;Delayed oral transit Oral - Multi-Consistency -- Oral - Pill -- Oral Phase - Comment --  CHL IP PHARYNGEAL PHASE 09/30/2019 Pharyngeal Phase Impaired Pharyngeal- Pudding Teaspoon -- Pharyngeal -- Pharyngeal- Pudding Cup -- Pharyngeal -- Pharyngeal- Honey Teaspoon -- Pharyngeal -- Pharyngeal- Honey Cup -- Pharyngeal -- Pharyngeal- Nectar Teaspoon -- Pharyngeal -- Pharyngeal- Nectar Cup -- Pharyngeal -- Pharyngeal- Nectar Straw -- Pharyngeal -- Pharyngeal- Thin Teaspoon -- Pharyngeal -- Pharyngeal- Thin Cup Delayed swallow initiation-pyriform sinuses;Reduced epiglottic inversion;Reduced tongue base  retraction Pharyngeal Material does not enter airway Pharyngeal- Thin Straw Delayed swallow initiation-pyriform sinuses;Reduced epiglottic inversion;Reduced airway/laryngeal closure;Reduced laryngeal elevation;Reduced anterior laryngeal mobility;Penetration/Aspiration before swallow;Penetration/Aspiration during swallow;Pharyngeal residue - valleculae Pharyngeal Material enters airway, remains ABOVE vocal cords and not ejected out;Material enters airway, remains ABOVE vocal cords then ejected out Pharyngeal- Puree Delayed swallow initiation-vallecula;Reduced tongue base retraction;Pharyngeal residue - valleculae Pharyngeal Material does not enter airway Pharyngeal- Mechanical Soft -- Pharyngeal -- Pharyngeal- Regular Delayed swallow initiation-vallecula;Reduced tongue base retraction;Pharyngeal residue - valleculae Pharyngeal Material does not enter airway Pharyngeal- Multi-consistency -- Pharyngeal -- Pharyngeal- Pill -- Pharyngeal -- Pharyngeal Comment --  No flowsheet data found. Celedonio Savage, MA, CCC-SLP Acute Rehabilitation Services Office: [phone] 09/30/2019, 5:14 PM               Assessment/Plan: S/P Procedure(s) (LRB): EVACUATION OF HEMATOMA (N/A)  1 hemodyn stable with some systolic HTN, sinus rhythm 2 sats good on RA, pulm managing trach decannulation progression 3 AKI- need for dialysis, management as per nephrology 4 anemia is pretty stable, nephrology assisting with management(aranesp/Iron) 5 leukocytosis trend is improving 6 insulin increased yesterday to improve control of BS- cont to adjust over time 7 G tube placement this week in IR- cont same feedings for now  LOS: 54 days    John Giovanni PA-C Pager [phone] 10/02/2019

## 2019-10-02 NOTE — Progress Notes (Signed)
18 Days Post-Op  Subjective: Patient is post-op from right pec flap, evacuation of hematoma, closure of sternal wound.   No pain this am, resting in bed.  No fevers.  Objective: Vital signs in last 24 hours: Temp:  [98 F (36.7 C)-99.5 F (37.5 C)] 98.5 F (36.9 C) (06/07 0720) Pulse Rate:  [97-105] 103 (06/07 0720) Resp:  [16-24] 19 (06/07 0720) BP: (121-142)/(59-70) 142/70 (06/07 0720) SpO2:  [97 %-100 %] 99 % (06/07 0720) FiO2 (%):  [21 %] 21 % (06/06 1458) Weight:  [84.5 kg] 84.5 kg (06/07 0325) Last BM Date: 10/02/19  Intake/Output from previous day: 06/06 0701 - 06/07 0700 In: 953.8 [NG/GT:953.8] Out: 0  Intake/Output this shift: No intake/output data recorded.  General appearance: alert, cooperative, no distress and resting in bed, elderly male Head: Normocephalic, without obvious abnormality, atraumatic Nose: NG in place Resp: unlabored Extremities: SCDs in place bilateral LE, LE boots in place. Incision/Wound: Sternal incision c/d/i, mepilex dressing in place, no drainage noted on dressing. Non ttp to right chest wall, no erythema, no fluid collections noted. Multiple healing tube sites noted on lower chest/upper abdomen. Left forearm wrapped with kerlix.   Lab Results:  CBC Latest Ref Rng & Units 10/02/2019 09/30/2019 09/29/2019  WBC 4.0 - 10.5 K/uL 17.9(H) 18.7(H) 19.9(H)  Hemoglobin 13.0 - 17.0 g/dL 7.5(L) 7.6(L) 8.2(L)  Hematocrit 39.0 - 52.0 % 24.1(L) 24.3(L) 26.3(L)  Platelets 150 - 400 K/uL 430(H) 413(H) 393    BMET Recent Labs    10/01/19 0510 10/02/19 0149  NA 136 136  K 3.9 4.2  CL 96* 95*  CO2 23 24  GLUCOSE 195* 186*  BUN 83* 146*  CREATININE 4.54* 6.20*  CALCIUM 9.1 8.9   PT/INR No results for input(s): LABPROT, INR in the last 72 hours. ABG No results for input(s): PHART, HCO3 in the last 72 hours.  Invalid input(s): PCO2, PO2  Studies/Results: DG Abd Portable 1V  Result Date: 09/30/2019 CLINICAL DATA:  Feeding tube position EXAM:  PORTABLE ABDOMEN - 1 VIEW COMPARISON:  08/30/2019 FINDINGS: A feeding tube is in place with tip in the fourth portion of the duodenum. The lower margin of a catheter projects over the right atrium, probably a dialysis catheter. Lower thoracic spondylosis. The visualized portion of the bowel gas pattern is unremarkable. IMPRESSION: 1. Feeding tube tip: 4th portion of the duodenum. 2. A catheter terminates in the right atrium from above. Electronically Signed   By: Van Clines M.D.   On: 09/30/2019 13:26   DG Swallowing Func-Speech Pathology  Result Date: 09/30/2019 Objective Swallowing Evaluation: Type of Study: MBS-Modified Barium Swallow Study  Patient Details Name: Christian [name] MRN: [id number] Date of Birth: 27-Feb-1946 Today's Date: 09/30/2019 Time: SLP Start Time (ACUTE ONLY): 1400 -SLP Stop Time (ACUTE ONLY): 1416 SLP Time Calculation (min) (ACUTE ONLY): 16 min Past Medical History: Past Medical History: Diagnosis Date . BPH (benign prostatic hypertrophy)  . Coronary artery disease  . Diabetes mellitus without complication ([place])  . Dry eyes left . Hiatal hernia  . 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  LIMA TO LAD, RIMA TO PDA, RADIAL ARTERY TO CIRC AND SEQUENTIALLY TO OM1. 08/15/2019 . Hyperlipidemia  . Hypertension  . Incomplete bladder emptying  . Nocturia  . 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 SOB (shortness of breath) on exertion  Past Surgical  History: Past Surgical History: Procedure Laterality Date . APPLICATION OF WOUND VAC N/A 05/05/3233  Procedure: APPLICATION OF WOUND VAC;  Surgeon: [name], MD;  Location: [facility];  Service: Thoracic;  Laterality: N/A; . APPLICATION OF WOUND VAC  08/29/2019  Procedure: Wound Vac change;  Surgeon: [name], MD;  Location: [place] [facility];  Service: Open Heart Surgery;; . APPLICATION OF WOUND VAC N/A 09/04/2019   Procedure: Application Of Wound Vac;  Surgeon: Grace Jasher, MD;  Location: [facility];  Service: Open Heart Surgery;  Laterality: N/A; . APPLICATION OF WOUND VAC N/A 5/73/2202  Procedure: APPLICATION OF ACELL, APPLICATION OF WOUND VAC USING PREVENA INCISIONAL  DRESSING;  Surgeon: Wallace Going, DO;  Location: [place];  Service: Plastics;  Laterality: N/A; . CARDIAC CATHETERIZATION   . CORONARY ARTERY BYPASS GRAFT N/A 08/15/2019  Procedure: CORONARY ARTERY BYPASS GRAFTING (CABG), x 4 using bilateral IMAs and left radial artery .  LIMA TO LAD, RIMA TO PDA, RADIAL ARTERY TO CIRC AND SEQUENTIALLY TO OM1.;  Surgeon: [name], MD;  Location: [facility];  Service: Open Heart Surgery;  Laterality: N/A; . CORONARY STENT PLACEMENT  02/27/2014  distal rt/pd coronary       dr Einar Gip . CYSTO/ BLADDER BIOPSY'S/ CAUTHERIZATION  01-14-2004  DR Gaynelle Arabian . EXPLORATION POST OPERATIVE OPEN HEART N/A 08/16/2019  Procedure: Chest Closure S?P CABG WITH APPLICATION OF PREVENA  INCISIONAL WOUND VAC;  Surgeon: [name], MD;  Location: [facility];  Service: Open Heart Surgery;  Laterality: N/A; . EXPLORATION POST OPERATIVE OPEN HEART N/A 08/21/2019  Procedure: CHEST WASHOUT S/P OPEN CHEST;  Surgeon: [name], MD;  Location: [place];  Service: Open Heart Surgery;  Laterality: N/A;  Open chest with Esmark dressing with Ioban sealant coverage. . EXPLORATION POST OPERATIVE OPEN HEART N/A 08/18/2019  Procedure: EXPLORATION POST OPERATIVE OPEN HEART (performed 04/23 on unit);  Surgeon: [name], MD;  Location: [place];  Service: Open Heart Surgery;  Laterality: N/A; . EXPLORATION POST OPERATIVE OPEN HEART N/A 08/24/2019  Procedure: CHEST WASHOUT POST OPERATIVE OPEN HEART;  Surgeon: [name], MD;  Location: [facility];  Service: Open Heart Surgery;  Laterality: N/A; . EXPLORATION POST OPERATIVE OPEN HEART N/A 08/29/2019  Procedure: CHEST WOUND WASHOUT POST OPERATIVE OPEN HEART;  Surgeon: [name], MD;  Location:  [place];  Service: Open Heart Surgery;  Laterality: N/A; . EXPLORATION POST OPERATIVE OPEN HEART N/A 09/04/2019  Procedure: MEDIASTINAL EXPLORATION WITH STERNAL WOUND IRRIGATION;  Surgeon: Grace Mccoy, MD;  Location: [facility];  Service: Open Heart Surgery;  Laterality: N/A; . EXPLORATION POST OPERATIVE OPEN HEART N/A 09/14/2019  Procedure: EVACUATION OF HEMATOMA;  Surgeon: Grace Caiden, MD;  Location: [facility];  Service: Open Heart Surgery;  Laterality: N/A; . IR FLUORO GUIDE CV LINE LEFT  09/19/2019 . IR PATIENT EVAL TECH 0-60 MINS  09/29/2019 . IR US GUIDE VASC ACCESS LEFT  09/19/2019 . LAPAROSCOPIC LYSIS OF ADHESIONS N/A 09/06/2019  Procedure: LAPAROSCOPIC OMENTAL HARVEST;  Surgeon: Kinsinger, Arta Bruce, MD;  Location: [facility];  Service: General;  Laterality: N/A; . LEFT HEART CATH AND CORONARY ANGIOGRAPHY N/A 08/10/2019  Procedure: LEFT HEART CATH AND CORONARY ANGIOGRAPHY;  Surgeon: Nigel Mormon, MD;  Location: [facility] LAB;  Service: Cardiovascular;  Laterality: N/A; . LEFT HEART CATHETERIZATION WITH CORONARY ANGIOGRAM N/A 02/27/2014  Procedure: LEFT HEART CATHETERIZATION WITH CORONARY ANGIOGRAM;  Surgeon: Laverda Page, MD;  Location: [facility] CATH LAB;  Service: Cardiovascular;  Laterality:  N/A; . MEDIASTINAL EXPLORATION N/A 09/06/2019  Procedure: MEDIASTINAL EXPLORATION;  Surgeon: Grace Kayhan, MD;  Location: [facility];  Service: Thoracic;  Laterality: N/A; . PECTORALIS FLAP  09/06/2019  Procedure: Pectoralis ADVANCEMENT Flap;  Surgeon: Wallace Going, DO;  Location: [facility];  Service: Plastics;; . PERCUTANEOUS CORONARY STENT INTERVENTION (PCI-S)  02/27/2014  Procedure: PERCUTANEOUS CORONARY STENT INTERVENTION (PCI-S);  Surgeon: Laverda Page, MD;  Location: [facility] C[facility] LAB;  Service: Cardiovascular;;  rt PDA  3.0/28mm Promus stent . RADIAL ARTERY HARVEST Left 08/15/2019  Procedure: Radial Artery Harvest;  Surgeon: [name], MD;  Location: [facility];  Service: Open Heart Surgery;  Laterality:  Left; . RIB PLATING N/A 09/06/2019  Procedure: STERNAL PLATING;  Surgeon: Grace Jaysion, MD;  Location: [facility];  Service: Thoracic;  Laterality: N/A; . TEE WITHOUT CARDIOVERSION N/A 08/15/2019  Procedure: TRANSESOPHAGEAL ECHOCARDIOGRAM (TEE);  Surgeon: [name], MD;  Location: [facility];  Service: Open Heart Surgery;  Laterality: N/A; . TRACHEOSTOMY TUBE PLACEMENT  08/29/2019  Procedure: Tracheostomy;  Surgeon: [name], MD;  Location: [place] [facility];  Service: Open Heart Surgery;; . TRANSURETHRAL RESECTION OF PROSTATE  04/04/2012  Procedure: TRANSURETHRAL RESECTION OF THE PROSTATE WITH GYRUS INSTRUMENTS;  Surgeon: Ailene Rud, MD;  Location: [place];  Service: Urology;  Laterality: N/A; . TRANSURETHRAL RESECTION OF PROSTATE N/A 09/27/2014  Procedure: TRANSURETHRAL RESECTION OF THE PROSTATE ;  Surgeon: Carolan Clines, MD;  Location: [facility];  Service: Urology;  Laterality: N/A; . UPPER GASTROINTESTINAL ENDOSCOPY   HPI: 74 yo admitted with NSTEMI 4/15, RLL PE, 4/20 CABG x 4 remained open with wound VAC with repeated washouts. 4/23 cardiac arrest, 4/29 initiated CRRT, vent s/p trach. 5/10 additional sternal washout unable to close.  5/12 sternal plating with pectoralis flap and omental harvest to mediastinum off CRRT with iHD started. Chest wound closed. Neurology consulted for left sided weakness- CTH neg 09/08/19. 5/20 pt with emergent hematoma evacuation and transition to trach collar; 5/28 downsized to #4 cuffless. PMHx: BPH, CAD, DM, HLD, HTN  Subjective: Awake, alert Assessment / Plan / Recommendation CHL IP CLINICAL IMPRESSIONS 09/30/2019 Clinical Impression Pt presents with mild oropharyngeal dysphagia which resulted in decreased lingual coordination, premature spillage, and penetration of thin liquid.  Although intial trials appeared very good, there was penetration with later trials, which may have been due in part to fatigue.  Pt's positioning was very poor preventing  visualization of laryngeal vestibule and vocal folds.  It was unclear if penetration proceeded to aspiration or if it was cleared during the swallow.  Pt was repositioned during the study to attempt to improve visibility without success. An oral diet cannot be recommended based on this study; however, it is not clear that PO intake is unsafe either.  Recommend further evaluation by FEES to improve visualization of laryngeal vestibule prior to proceeding wiht PEG placement. SLP Visit Diagnosis Dysphagia, oropharyngeal phase (R13.12) Attention and concentration deficit following -- Frontal lobe and executive function deficit following -- Impact on safety and function Mild aspiration risk   CHL IP TREATMENT RECOMMENDATION 09/30/2019 Treatment Recommendations F/U MBS in --- days (Comment)   Prognosis 09/26/2019 Prognosis for Safe Diet Advancement Good Barriers to Reach Goals -- Barriers/Prognosis Comment -- CHL IP DIET RECOMMENDATION 09/30/2019 SLP Diet Recommendations NPO Liquid Administration via -- Medication Administration -- Compensations -- Postural Changes --   No flowsheet data found.  CHL IP FOLLOW UP RECOMMENDATIONS 09/26/2019 Follow up Recommendations 24 hour supervision/assistance   CHL IP  FREQUENCY AND DURATION 09/26/2019 Speech Therapy Frequency (ACUTE ONLY) min 2x/week Treatment Duration 2 weeks      CHL IP ORAL PHASE 09/30/2019 Oral Phase Impaired Oral - Pudding Teaspoon -- Oral - Pudding Cup -- Oral - Honey Teaspoon -- Oral - Honey Cup -- Oral - Nectar Teaspoon -- Oral - Nectar Cup -- Oral - Nectar Straw -- Oral - Thin Teaspoon -- Oral - Thin Cup Premature spillage;Lingual/palatal residue Oral - Thin Straw Premature spillage;Lingual/palatal residue Oral - Puree Lingual/palatal residue;Premature spillage;Lingual pumping;Delayed oral transit Oral - Mech Soft -- Oral - Regular Lingual pumping;Lingual/palatal residue;Delayed oral transit Oral - Multi-Consistency -- Oral - Pill -- Oral Phase - Comment --  CHL IP  PHARYNGEAL PHASE 09/30/2019 Pharyngeal Phase Impaired Pharyngeal- Pudding Teaspoon -- Pharyngeal -- Pharyngeal- Pudding Cup -- Pharyngeal -- Pharyngeal- Honey Teaspoon -- Pharyngeal -- Pharyngeal- Honey Cup -- Pharyngeal -- Pharyngeal- Nectar Teaspoon -- Pharyngeal -- Pharyngeal- Nectar Cup -- Pharyngeal -- Pharyngeal- Nectar Straw -- Pharyngeal -- Pharyngeal- Thin Teaspoon -- Pharyngeal -- Pharyngeal- Thin Cup Delayed swallow initiation-pyriform sinuses;Reduced epiglottic inversion;Reduced tongue base retraction Pharyngeal Material does not enter airway Pharyngeal- Thin Straw Delayed swallow initiation-pyriform sinuses;Reduced epiglottic inversion;Reduced airway/laryngeal closure;Reduced laryngeal elevation;Reduced anterior laryngeal mobility;Penetration/Aspiration before swallow;Penetration/Aspiration during swallow;Pharyngeal residue - valleculae Pharyngeal Material enters airway, remains ABOVE vocal cords and not ejected out;Material enters airway, remains ABOVE vocal cords then ejected out Pharyngeal- Puree Delayed swallow initiation-vallecula;Reduced tongue base retraction;Pharyngeal residue - valleculae Pharyngeal Material does not enter airway Pharyngeal- Mechanical Soft -- Pharyngeal -- Pharyngeal- Regular Delayed swallow initiation-vallecula;Reduced tongue base retraction;Pharyngeal residue - valleculae Pharyngeal Material does not enter airway Pharyngeal- Multi-consistency -- Pharyngeal -- Pharyngeal- Pill -- Pharyngeal -- Pharyngeal Comment --  No flowsheet data found. Celedonio Savage, [place], [place] Office: [phone] 09/30/2019, 5:14 PM               Anti-infectives: Anti-infectives (From admission, onward)   Start     Dose/Rate Route Frequency Ordered Stop   09/19/19 1417  ceFAZolin (ANCEF) 2-4 GM/100ML-% IVPB    Note to Pharmacy: Rudene Re   : cabinet override      09/19/19 1417 09/20/19 0229   09/19/19 1330  ceFAZolin (ANCEF) IVPB 1 g/50 mL premix  Status:   Discontinued     1 g 100 mL/hr over 30 Minutes Intravenous Once 09/19/19 1317 09/19/19 1329   09/19/19 1330  ceFAZolin (ANCEF) IVPB 2g/100 mL premix     2 g 200 mL/hr over 30 Minutes Intravenous  Once 09/19/19 1327 09/19/19 1448   09/14/19 0915  vancomycin (VANCOCIN) IVPB 1000 mg/200 mL premix     1,000 mg 200 mL/hr over 60 Minutes Intravenous To Surgery 09/14/19 0907 09/15/19 0915   09/07/19 2000  vancomycin (VANCOREADY) IVPB 750 mg/150 mL     750 mg 150 mL/hr over 60 Minutes Intravenous  Once 09/07/19 1510 09/07/19 2245   09/06/19 2200  meropenem (MERREM) 500 mg in sodium chloride 0.9 % 100 mL IVPB     500 mg 200 mL/hr over 30 Minutes Intravenous Every 24 hours 09/06/19 1542 09/09/19 0015   09/06/19 2000  fluconazole (DIFLUCAN) IVPB 400 mg     400 mg 100 mL/hr over 120 Minutes Intravenous Every 24 hours 09/06/19 1542 09/08/19 2240   09/06/19 1800  vancomycin (VANCOREADY) IVPB 750 mg/150 mL     750 mg 150 mL/hr over 60 Minutes Intravenous  Once 09/06/19 1542 09/07/19 0022   09/06/19 1540  vancomycin variable dose per unstable renal function (pharmacist  dosing)      Does not apply See admin instructions 09/06/19 1542 09/08/19 2359   09/06/19 1100  ceFAZolin (ANCEF) IVPB 2g/100 mL premix  Status:  Discontinued     2 g 200 mL/hr over 30 Minutes Intravenous 30 min pre-op 09/05/19 1717 09/06/19 1644   09/06/19 0600  ceFAZolin (ANCEF) IVPB 2g/100 mL premix  Status:  Discontinued     2 g 200 mL/hr over 30 Minutes Intravenous To Short Stay 09/06/19 0546 09/06/19 1644   09/02/19 2200  meropenem (MERREM) 1 g in sodium chloride 0.9 % 100 mL IVPB  Status:  Discontinued     1 g 200 mL/hr over 30 Minutes Intravenous Every 8 hours 09/02/19 1011 09/06/19 1542   09/02/19 2000  fluconazole (DIFLUCAN) IVPB 800 mg  Status:  Discontinued     800 mg 100 mL/hr over 240 Minutes Intravenous Every 24 hours 09/02/19 1011 09/06/19 1542   09/02/19 2000  fluconazole (DIFLUCAN) IVPB 800 mg  Status:   Discontinued     800 mg 200 mL/hr over 120 Minutes Intravenous Every 24 hours 09/02/19 1329 09/02/19 1330   09/02/19 1800  vancomycin (VANCOCIN) IVPB 1000 mg/200 mL premix  Status:  Discontinued     1,000 mg 200 mL/hr over 60 Minutes Intravenous Every 24 hours 09/02/19 1011 09/06/19 1542   09/02/19 0900  meropenem (MERREM) 1 g in sodium chloride 0.9 % 100 mL IVPB  Status:  Discontinued     1 g 200 mL/hr over 30 Minutes Intravenous Every 24 hours 09/01/19 1333 09/02/19 1011   09/01/19 1700  fluconazole (DIFLUCAN) IVPB 800 mg     800 mg 100 mL/hr over 240 Minutes Intravenous  Once 09/01/19 1645 09/01/19 2057   09/01/19 1630  fluconazole (DIFLUCAN) IVPB 400 mg  Status:  Discontinued     400 mg 100 mL/hr over 120 Minutes Intravenous  Once 09/01/19 1629 09/01/19 1645   09/01/19 1330  vancomycin variable dose per unstable renal function (pharmacist dosing)  Status:  Discontinued      Does not apply See admin instructions 09/01/19 1333 09/02/19 1325   09/01/19 0730  meropenem (MERREM) 1 g in sodium chloride 0.9 % 100 mL IVPB  Status:  Discontinued     1 g 200 mL/hr over 30 Minutes Intravenous Every 8 hours 09/01/19 0729 09/01/19 1327   08/31/19 2200  vancomycin (VANCOCIN) IVPB 1000 mg/200 mL premix  Status:  Discontinued     1,000 mg 200 mL/hr over 60 Minutes Intravenous Every 24 hours 08/30/19 2235 09/01/19 1330   08/25/19 0800  vancomycin (VANCOREADY) IVPB 1250 mg/250 mL  Status:  Discontinued     1,250 mg 166.7 mL/hr over 90 Minutes Intravenous Every 24 hours 08/24/19 2121 08/24/19 2128   08/25/19 0100  vancomycin (VANCOREADY) IVPB 1250 mg/250 mL  Status:  Discontinued     1,250 mg 166.7 mL/hr over 90 Minutes Intravenous Every 24 hours 08/24/19 2128 08/30/19 2235   08/24/19 2300  ceFEPIme (MAXIPIME) 2 g in sodium chloride 0.9 % 100 mL IVPB  Status:  Discontinued     2 g 200 mL/hr over 30 Minutes Intravenous Every 12 hours 08/24/19 2150 09/01/19 0729   08/24/19 1338  vancomycin (VANCOCIN)  1,000 mg in sodium chloride 0.9 % 1,000 mL irrigation  Status:  Discontinued       As needed 08/24/19 1338 08/24/19 1508   08/21/19 0000  ceFEPIme (MAXIPIME) 1 g in sodium chloride 0.9 % 100 mL IVPB  Status:  Discontinued  1 g 200 mL/hr over 30 Minutes Intravenous Every 24 hours 08/20/19 0954 08/24/19 2150   08/20/19 1200  vancomycin (VANCOCIN) IVPB 1000 mg/200 mL premix     1,000 mg 200 mL/hr over 60 Minutes Intravenous  Once 08/20/19 0954 08/21/19 0011   08/19/19 2129  vancomycin variable dose per unstable renal function (pharmacist dosing)  Status:  Discontinued      Does not apply See admin instructions 08/19/19 2129 08/24/19 2128   08/19/19 0000  ceFEPIme (MAXIPIME) 2 g in sodium chloride 0.9 % 100 mL IVPB  Status:  Discontinued     2 g 200 mL/hr over 30 Minutes Intravenous Every 24 hours 08/18/19 2351 08/20/19 0954   08/19/19 0000  vancomycin (VANCOREADY) IVPB 2000 mg/400 mL     2,000 mg 200 mL/hr over 120 Minutes Intravenous  Once 08/18/19 2351 08/19/19 0354   08/16/19 1731  vancomycin (VANCOCIN) powder  Status:  Discontinued       As needed 08/16/19 1732 08/16/19 1808   08/15/19 2045  vancomycin (VANCOCIN) IVPB 1000 mg/200 mL premix     1,000 mg 200 mL/hr over 60 Minutes Intravenous  Once 08/15/19 1402 08/15/19 2209   08/15/19 1645  cefUROXime (ZINACEF) 1.5 g in sodium chloride 0.9 % 100 mL IVPB     1.5 g 200 mL/hr over 30 Minutes Intravenous Every 12 hours 08/15/19 1402 08/17/19 0700   08/15/19 0958  vancomycin (VANCOCIN) powder  Status:  Discontinued       As needed 08/15/19 1000 08/15/19 1402   08/15/19 0400  vancomycin (VANCOREADY) IVPB 1500 mg/300 mL     1,500 mg 150 mL/hr over 120 Minutes Intravenous To Surgery 08/14/19 1121 08/15/19 0830   08/15/19 0400  cefUROXime (ZINACEF) 1.5 g in sodium chloride 0.9 % 100 mL IVPB     1.5 g 200 mL/hr over 30 Minutes Intravenous To Surgery 08/14/19 1121 08/15/19 0812   08/15/19 0400  cefUROXime (ZINACEF) 750 mg in sodium chloride  0.9 % 100 mL IVPB     750 mg 200 mL/hr over 30 Minutes Intravenous To Surgery 08/14/19 1121 08/15/19 1300      Assessment/Plan: s/p Procedure(s): EVACUATION OF HEMATOMA  Sternal incision healing nicely, no sign of breakdown, no fluid collections noted.  Will continue to follow Continue with mepilex border dressing changes q2-3 days pending drainage on bandage.     LOS: 41 days    Charlies Constable, PA-C 10/02/2019

## 2019-10-02 NOTE — Plan of Care (Signed)
  Problem: Clinical Measurements: °Goal: Ability to maintain clinical measurements within normal limits will improve °Outcome: Progressing °Goal: Will remain free from infection °Outcome: Progressing °Goal: Respiratory complications will improve °Outcome: Progressing °  °Problem: Nutrition: °Goal: Adequate nutrition will be maintained °Outcome: Progressing °  °

## 2019-10-02 NOTE — Progress Notes (Signed)
Patient ID: Christian [name], male   DOB: [birth year], 74 y.o.   MRN: [id number] [facility] Progress Note   Assessment/ Plan:   1. Acute kidney Injury: This appears to be multifactorial/multiphasic following coronary angiography, CABG with cardiogenic shock and cardiac arrest.  Earlier on CRRT from 4/29-5/12 and now transition to intermittent hemodialysis with no evidence of renal recovery so far.  He is getting hemodialysis via his left IJ [facility] and will be scheduled again for dialysis again tomorrow.  Given persistent anuric state and prolonged dialysis dependency, chances of recovery are diminishing.  Plans noted for SNF admission (possibly after PEG tube placement). 2.  Coronary artery disease status post four-vessel CABG: Complicated by cardiac tamponade with repeat thoracotomy for evacuation of hematoma/blood in chest. 3.  Postoperative acute hypoxic respiratory failure with history of COPD: Tracheostomy has been capped and oxygenation appears to be satisfactory. 4.  Anemia of critical illness: Hemoglobin and hematocrit noted; will continue to follow trend and transfuse 1 unit PRBC with hemodialysis tomorrow. 5.  History of ischemic cardiomyopathy with acute systolic heart failure  Subjective:   Without acute events overnight, patient nodding to questions with limited verbal response.   Objective:   BP (!) 142/70 (BP Location: Right Arm)   Pulse (!) 103   Temp 98.5 F (36.9 C) (Oral)   Resp 19   Ht 5\' 9"  (1.753 m)   Wt 84.5 kg   SpO2 99%   BMI 27.51 kg/m   Intake/Output Summary (Last 24 hours) at 10/02/2019 0747 Last data filed at 10/02/2019 0325 Gross per 24 hour  Intake 953.75 ml  Output 0 ml  Net 953.75 ml   Weight change: 6.7 kg  Physical Exam: Gen: Appears to be comfortable resting in bed, nods to questions.  NGT in situ CVS: Pulse regular tachycardia, S1 and S2 normal.  Left IJ TDC in situ Resp: Anteriorly clear to auscultation, no rales/rhonchi Abd: Soft,  flat, nontender Ext: Both legs in cushioned booties, no edema.  Imaging: DG Abd Portable 1V  Result Date: 09/30/2019 CLINICAL DATA:  Feeding tube position EXAM: PORTABLE ABDOMEN - 1 VIEW COMPARISON:  08/30/2019 FINDINGS: A feeding tube is in place with tip in the fourth portion of the duodenum. The lower margin of a catheter projects over the right atrium, probably a dialysis catheter. Lower thoracic spondylosis. The visualized portion of the bowel gas pattern is unremarkable. IMPRESSION: 1. Feeding tube tip: 4th portion of the duodenum. 2. A catheter terminates in the right atrium from above. Electronically Signed   By: Van Clines M.D.   On: 09/30/2019 13:26   DG Swallowing Func-Speech Pathology  Result Date: 09/30/2019 Objective Swallowing Evaluation: Type of Study: MBS-Modified Barium Swallow Study  Patient Details Name: Christian [name] MRN: [id number] Date of Birth: July 27, 1945 Today's Date: 09/30/2019 Time: SLP Start Time (ACUTE ONLY): 1400 -SLP Stop Time (ACUTE ONLY): 1416 SLP Time Calculation (min) (ACUTE ONLY): 16 min Past Medical History: Past Medical History: Diagnosis Date . BPH (benign prostatic hypertrophy)  . Coronary artery disease  . Diabetes mellitus without complication ([place])  . Dry eyes left . Hiatal hernia  . 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  LIMA TO LAD, RIMA TO PDA, RADIAL ARTERY TO CIRC AND SEQUENTIALLY TO OM1. 08/15/2019 . Hyperlipidemia  . Hypertension  . Incomplete bladder emptying  . Nocturia  . 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 SOB (shortness of breath) on exertion  Past Surgical History: Past Surgical History: Procedure Laterality Date . APPLICATION OF WOUND VAC N/A 0/24/0973  Procedure: APPLICATION OF WOUND VAC;  Surgeon: [name], MD;  Location: [place];  Service: Thoracic;  Laterality: N/A; . APPLICATION OF WOUND VAC   08/29/2019  Procedure: Wound Vac change;  Surgeon: [name], MD;  Location: [place] OR;  Service: Open Heart Surgery;; . APPLICATION OF WOUND VAC N/A 5/32/9924  Procedure: Application Of Wound Vac;  Surgeon: Grace Jacory, MD;  Location: [place];  Service: Open Heart Surgery;  Laterality: N/A; . APPLICATION OF WOUND VAC N/A 2/68/3419  Procedure: APPLICATION OF ACELL, APPLICATION OF WOUND VAC USING PREVENA INCISIONAL  DRESSING;  Surgeon: Wallace Going, DO;  Location: [place];  Service: Plastics;  Laterality: N/A; . CARDIAC CATHETERIZATION   . CORONARY ARTERY BYPASS GRAFT N/A 08/15/2019  Procedure: CORONARY ARTERY BYPASS GRAFTING (CABG), x 4 using bilateral IMAs and left radial artery .  LIMA TO LAD, RIMA TO PDA, RADIAL ARTERY TO CIRC AND SEQUENTIALLY TO OM1.;  Surgeon: [name], MD;  Location: [place];  Service: Open Heart Surgery;  Laterality: N/A; . CORONARY STENT PLACEMENT  02/27/2014  distal rt/pd coronary       dr Einar Gip . CYSTO/ BLADDER BIOPSY'S/ CAUTHERIZATION  01-14-2004  DR Gaynelle Arabian . EXPLORATION POST OPERATIVE OPEN HEART N/A 08/16/2019  Procedure: Chest Closure S?P CABG WITH APPLICATION OF PREVENA  INCISIONAL WOUND VAC;  Surgeon: [name], MD;  Location: [facility];  Service: Open Heart Surgery;  Laterality: N/A; . EXPLORATION POST OPERATIVE OPEN HEART N/A 08/21/2019  Procedure: CHEST WASHOUT S/P OPEN CHEST;  Surgeon: [name], MD;  Location: [place];  Service: Open Heart Surgery;  Laterality: N/A;  Open chest with Esmark dressing with Ioban sealant coverage. . EXPLORATION POST OPERATIVE OPEN HEART N/A 08/18/2019  Procedure: EXPLORATION POST OPERATIVE OPEN HEART (performed 04/23 on unit);  Surgeon: [name], MD;  Location: [place];  Service: Open Heart Surgery;  Laterality: N/A; . EXPLORATION POST OPERATIVE OPEN HEART N/A 08/24/2019  Procedure: CHEST WASHOUT POST OPERATIVE OPEN HEART;  Surgeon: [name], MD;  Location: [place];  Service: Open Heart Surgery;  Laterality:  N/A; . EXPLORATION POST OPERATIVE OPEN HEART N/A 08/29/2019  Procedure: CHEST WOUND WASHOUT POST OPERATIVE OPEN HEART;  Surgeon: [name], MD;  Location: [place];  Service: Open Heart Surgery;  Laterality: N/A; . EXPLORATION POST OPERATIVE OPEN HEART N/A 09/04/2019  Procedure: MEDIASTINAL EXPLORATION WITH STERNAL WOUND IRRIGATION;  Surgeon: Grace Keionte, MD;  Location: [place];  Service: Open Heart Surgery;  Laterality: N/A; . EXPLORATION POST OPERATIVE OPEN HEART N/A 09/14/2019  Procedure: EVACUATION OF HEMATOMA;  Surgeon: Grace Joushua, MD;  Location: [place];  Service: Open Heart Surgery;  Laterality: N/A; . IR FLUORO GUIDE CV LINE LEFT  09/19/2019 . IR PATIENT EVAL TECH 0-60 MINS  09/29/2019 . IR US GUIDE VASC ACCESS LEFT  09/19/2019 . LAPAROSCOPIC LYSIS OF ADHESIONS N/A 09/06/2019  Procedure: LAPAROSCOPIC OMENTAL HARVEST;  Surgeon: Kinsinger, Arta Bruce, MD;  Location: [place];  Service: General;  Laterality: N/A; . LEFT HEART CATH AND CORONARY ANGIOGRAPHY N/A 08/10/2019  Procedure: LEFT HEART CATH AND CORONARY ANGIOGRAPHY;  Surgeon: Nigel Mormon, MD;  Location: [place] CV LAB;  Service: Cardiovascular;  Laterality: N/A; . LEFT HEART CATHETERIZATION WITH CORONARY ANGIOGRAM N/A 02/27/2014  Procedure: LEFT HEART CATHETERIZATION WITH CORONARY  ANGIOGRAM;  Surgeon: Laverda Page, MD;  Location: [facility] CATH LAB;  Service: Cardiovascular;  Laterality: N/A; . MEDIASTINAL EXPLORATION N/A 09/06/2019  Procedure: MEDIASTINAL EXPLORATION;  Surgeon: Grace Temesgen, MD;  Location: [place];  Service: Thoracic;  Laterality: N/A; . PECTORALIS FLAP  09/06/2019  Procedure: Pectoralis ADVANCEMENT Flap;  Surgeon: Wallace Going, DO;  Location: [place];  Service: Plastics;; . PERCUTANEOUS CORONARY STENT INTERVENTION (PCI-S)  02/27/2014  Procedure: PERCUTANEOUS CORONARY STENT INTERVENTION (PCI-S);  Surgeon: Laverda Page, MD;  Location: [facility] CATH LAB;  Service: Cardiovascular;;  rt PDA  3.0/28mm Promus stent . RADIAL  ARTERY HARVEST Left 08/15/2019  Procedure: Radial Artery Harvest;  Surgeon: [name], MD;  Location: [place];  Service: Open Heart Surgery;  Laterality: Left; . RIB PLATING N/A 09/06/2019  Procedure: STERNAL PLATING;  Surgeon: Grace Ovide, MD;  Location: [place];  Service: Thoracic;  Laterality: N/A; . TEE WITHOUT CARDIOVERSION N/A 08/15/2019  Procedure: TRANSESOPHAGEAL ECHOCARDIOGRAM (TEE);  Surgeon: [name], MD;  Location: [place];  Service: Open Heart Surgery;  Laterality: N/A; . TRACHEOSTOMY TUBE PLACEMENT  08/29/2019  Procedure: Tracheostomy;  Surgeon: [name], MD;  Location: [place] [facility];  Service: Open Heart Surgery;; . TRANSURETHRAL RESECTION OF PROSTATE  04/04/2012  Procedure: TRANSURETHRAL RESECTION OF THE PROSTATE WITH GYRUS INSTRUMENTS;  Surgeon: Ailene Rud, MD;  Location: [place];  Service: Urology;  Laterality: N/A; . TRANSURETHRAL RESECTION OF PROSTATE N/A 09/27/2014  Procedure: TRANSURETHRAL RESECTION OF THE PROSTATE ;  Surgeon: Carolan Clines, MD;  Location: [facility];  Service: Urology;  Laterality: N/A; . UPPER GASTROINTESTINAL ENDOSCOPY   HPI: 74 yo admitted with NSTEMI 4/15, RLL PE, 4/20 CABG x 4 remained open with wound VAC with repeated washouts. 4/23 cardiac arrest, 4/29 initiated CRRT, vent s/p trach. 5/10 additional sternal washout unable to close.  5/12 sternal plating with pectoralis flap and omental harvest to mediastinum off CRRT with iHD started. Chest wound closed. Neurology consulted for left sided weakness- CTH neg 09/08/19. 5/20 pt with emergent hematoma evacuation and transition to trach collar; 5/28 downsized to #4 cuffless. PMHx: BPH, CAD, DM, HLD, HTN  Subjective: Awake, alert Assessment / Plan / Recommendation CHL IP CLINICAL IMPRESSIONS 09/30/2019 Clinical Impression Pt presents with mild oropharyngeal dysphagia which resulted in decreased lingual coordination, premature spillage, and penetration of thin liquid.  Although intial  trials appeared very good, there was penetration with later trials, which may have been due in part to fatigue.  Pt's positioning was very poor preventing visualization of laryngeal vestibule and vocal folds.  It was unclear if penetration proceeded to aspiration or if it was cleared during the swallow.  Pt was repositioned during the study to attempt to improve visibility without success. An oral diet cannot be recommended based on this study; however, it is not clear that PO intake is unsafe either.  Recommend further evaluation by FEES to improve visualization of laryngeal vestibule prior to proceeding wiht PEG placement. SLP Visit Diagnosis Dysphagia, oropharyngeal phase (R13.12) Attention and concentration deficit following -- Frontal lobe and executive function deficit following -- Impact on safety and function Mild aspiration risk   CHL IP TREATMENT RECOMMENDATION 09/30/2019 Treatment Recommendations F/U MBS in --- days (Comment)   Prognosis 09/26/2019 Prognosis for Safe Diet Advancement Good Barriers to Reach Goals -- Barriers/Prognosis Comment -- CHL IP DIET RECOMMENDATION 09/30/2019 SLP Diet Recommendations NPO Liquid Administration via -- Medication Administration -- Compensations -- Postural Changes --   No flowsheet data found.  CHL IP FOLLOW UP RECOMMENDATIONS 09/26/2019 Follow up Recommendations 24 hour supervision/assistance   CHL IP FREQUENCY AND DURATION 09/26/2019 Speech Therapy Frequency (ACUTE ONLY) min 2x/week Treatment Duration 2 weeks      CHL IP ORAL PHASE 09/30/2019 Oral Phase Impaired Oral - Pudding Teaspoon -- Oral - Pudding Cup -- Oral - Honey Teaspoon -- Oral - Honey Cup -- Oral - Nectar Teaspoon -- Oral - Nectar Cup -- Oral - Nectar Straw -- Oral - Thin Teaspoon -- Oral - Thin Cup Premature spillage;Lingual/palatal residue Oral - Thin Straw Premature spillage;Lingual/palatal residue Oral - Puree Lingual/palatal residue;Premature spillage;Lingual pumping;Delayed oral transit Oral - Mech Soft --  Oral - Regular Lingual pumping;Lingual/palatal residue;Delayed oral transit Oral - Multi-Consistency -- Oral - Pill -- Oral Phase - Comment --  CHL IP PHARYNGEAL PHASE 09/30/2019 Pharyngeal Phase Impaired Pharyngeal- Pudding Teaspoon -- Pharyngeal -- Pharyngeal- Pudding Cup -- Pharyngeal -- Pharyngeal- Honey Teaspoon -- Pharyngeal -- Pharyngeal- Honey Cup -- Pharyngeal -- Pharyngeal- Nectar Teaspoon -- Pharyngeal -- Pharyngeal- Nectar Cup -- Pharyngeal -- Pharyngeal- Nectar Straw -- Pharyngeal -- Pharyngeal- Thin Teaspoon -- Pharyngeal -- Pharyngeal- Thin Cup Delayed swallow initiation-pyriform sinuses;Reduced epiglottic inversion;Reduced tongue base retraction Pharyngeal Material does not enter airway Pharyngeal- Thin Straw Delayed swallow initiation-pyriform sinuses;Reduced epiglottic inversion;Reduced airway/laryngeal closure;Reduced laryngeal elevation;Reduced anterior laryngeal mobility;Penetration/Aspiration before swallow;Penetration/Aspiration during swallow;Pharyngeal residue - valleculae Pharyngeal Material enters airway, remains ABOVE vocal cords and not ejected out;Material enters airway, remains ABOVE vocal cords then ejected out Pharyngeal- Puree Delayed swallow initiation-vallecula;Reduced tongue base retraction;Pharyngeal residue - valleculae Pharyngeal Material does not enter airway Pharyngeal- Mechanical Soft -- Pharyngeal -- Pharyngeal- Regular Delayed swallow initiation-vallecula;Reduced tongue base retraction;Pharyngeal residue - valleculae Pharyngeal Material does not enter airway Pharyngeal- Multi-consistency -- Pharyngeal -- Pharyngeal- Pill -- Pharyngeal -- Pharyngeal Comment --  No flowsheet data found. Celedonio Savage, MA, CCC-SLP Acute Rehabilitation Services Office: [phone] 09/30/2019, 5:14 PM               Labs: BMET Recent Labs  Lab 09/26/19 0345 09/27/19 0219 09/28/19 0445 09/29/19 0247 09/30/19 0228 10/01/19 0510 10/02/19 0149  NA 133* 133* 134* 133* 134* 136 136  K 4.0  4.0 4.3 4.5 4.9 3.9 4.2  CL 93* 95* 95* 93* 96* 96* 95*  CO2 24 25 23 24 22 23 24   GLUCOSE 161* 174* 147* 175* 192* 195* 186*  BUN 142* 85* 142* 91* 135* 83* 146*  CREATININE 6.55* 4.27* 6.08* 4.77* 6.76* 4.54* 6.20*  CALCIUM 9.1 8.8* 8.9 9.0 9.0 9.1 8.9  PHOS 6.1* 4.5 5.3* 4.8* 7.0* 6.0* 7.5*   CBC Recent Labs  Lab 09/26/19 1116 09/29/19 0756 09/30/19 0702 10/02/19 0149  WBC 18.5* 19.9* 18.7* 17.9*  HGB 8.7* 8.2* 7.6* 7.5*  HCT 27.1* 26.3* 24.3* 24.1*  MCV 98.5 98.9 100.4* 99.2  PLT 338 393 413* 430*    Medications:    .  stroke: mapping our early stages of recovery book   Does not apply Once  . amiodarone  200 mg Per Tube Daily  . arformoterol  15 mcg Nebulization BID  . aspirin  81 mg Per Tube Daily  . atorvastatin  40 mg Per Tube Daily  . bisacodyl  10 mg Rectal Daily  . chlorhexidine  15 mL Mouth Rinse BID  . Chlorhexidine Gluconate Cloth  6 each Topical Q0600  . clonazePAM  0.5 mg Per Tube BID  . [START ON 10/03/2019] darbepoetin (ARANESP) injection - DIALYSIS  100 mcg Intravenous Q Tue-HD  . feeding supplement (PRO-STAT  SUGAR FREE 64)  60 mL Per Tube TID  . insulin aspart  0-15 Units Subcutaneous Q4H  . insulin detemir  12 Units Subcutaneous BID  . mouth rinse  15 mL Mouth Rinse q12n4p  . midodrine  5 mg Per Tube TID WC  . multivitamin  1 tablet Oral QHS  . nystatin  5 mL Oral QID  . pantoprazole sodium  40 mg Per Tube Daily  . revefenacin  175 mcg Nebulization Daily  . sertraline  25 mg Per Tube Daily  . sevelamer carbonate  1.6 g Per Tube TID WC  . trospium  20 mg Per Tube BID   Elmarie Shiley, MD 10/02/2019, 7:47 AM

## 2019-10-02 NOTE — TOC Progression Note (Signed)
Transition of Care [facility]) - Progression Note    Patient Details  Name: SAIF PETER MRN: [id number] Date of Birth: Jul 14, 1945  Transition of Care [facility]) CM/SW [place], [place] Phone Number: 10/02/2019, 3:04 PM  Clinical Narrative:     Original insurance authorization reference [id number]. denied by [facility]. An appeal for SNF authorization will need to be completed closer to discharge. Contact for fast appeal is [phone], option 3. TOC team will continue to follow and assist with discharge planning.   Expected Discharge Plan: [place] Barriers to Discharge: Continued Medical Work up  Expected Discharge Plan and Services Expected Discharge Plan: [place] arrangements for the past 2 months: Single Family Home                                       Social Determinants of Health (SDOH) Interventions    Readmission Risk Interventions No flowsheet data found.

## 2019-10-02 NOTE — Plan of Care (Signed)
  Problem: Education: Goal: Knowledge of General Education information will improve Description: Including pain rating scale, medication(s)/side effects and non-pharmacologic comfort measures Outcome: Progressing   Problem: Clinical Measurements: Goal: Ability to maintain clinical measurements within normal limits will improve Outcome: Progressing   Problem: Clinical Measurements: Goal: Cardiovascular complication will be avoided Outcome: Progressing   Problem: Clinical Measurements: Goal: Diagnostic test results will improve Outcome: Progressing   Problem: Nutrition: Goal: Adequate nutrition will be maintained Outcome: Progressing   Problem: Coping: Goal: Level of anxiety will decrease Outcome: Progressing   Problem: Pain Managment: Goal: General experience of comfort will improve Outcome: Progressing   Problem: Skin Integrity: Goal: Risk for impaired skin integrity will decrease Outcome: Progressing   Problem: Education: Goal: Knowledge of disease or condition will improve Outcome: Progressing   Problem: Education: Goal: Knowledge of the prescribed therapeutic regimen will improve Outcome: Progressing   Problem: Cardiac: Goal: Will achieve and/or maintain hemodynamic stability Outcome: Progressing   Problem: Skin Integrity: Goal: Wound healing without signs and symptoms of infection Outcome: Progressing

## 2019-10-02 NOTE — Progress Notes (Addendum)
NAME:  Christian [name], MRN:  [id number], DOB:  12-06-1945, LOS: 70 ADMISSION DATE:  08/09/2019, CONSULTATION DATE:  08/28/2019 REFERRING MD:  Orvan Seen - TRH, CHIEF COMPLAINT:  Respiratory failure.    HPI/course in hospital   74 year old man who presented with progressive dyspnea.  He was admitted 4/14 and was incidentally found to have a right lower lobe pulmonary embolism.  Overall the picture was that of decompensated heart failure with a non-STEMI.  Coronary angiography revealed triple-vessel disease and the patient was referred for cardiac surgery.  EF was found to be reduced at that time.  He underwent four-vessel CABG 4/20 with good quality distal targets.  Cardiac function remained reduced post bypass.  Unable to close chest due to surrounding edema.  Underwent attempted closure with VAC placement 4/22.   Cardiac arrest 4/23 with repeated washouts with VAC placements on 4/26 and 4/29.  Significant volume overload postoperatively.  Acute kidney injury.  Transitioned from CRRT for fluid removal to iHD on 5/13.  Sig Events: 08/09/19 Admitted with NSTEM and ? Small RLL PE. Weight 213 pounds.  08/15/19 CABG x4 on 08/15/19 08/18/19 Cardiac arrest due to tamponade. Chest opened for washout 4/26 Chest washout in OR 4/29 CVVH started 5/5 OR for chest washout, tracheostomy 5/6 Trach aspirate with pan sensitive pseudomonas (completed course of abx  5/10 repeat washout, unable to close 5/12 to OR for chest closure with muscle flap  5/13 transitioned to [facility] 5/14 Neurology consulted for left sided weakness- CTH neg 5/20 chest wall swelling > taken to OR for evacuation of hematoma and re-closure.  5/24 down sized to 6 trach 5/28 down sized to 4 cuffless trach  6/01 Trach capped and tolerating well   Micro: 4/14 SARS/ Flu A/B >> neg 4/19 MRSA PCR >> neg 4/22 BCx 2 >> neg 5/2 trach asp >> normal flora  5/2 BCx2 >> neg 5/6 trach asp >> pan sensitive pseudomonas  5/6 BCx2 >> neg 5/10  sternal wound cx >> neg 5/14 BCx 2 >> ngtd  5/14 trach asp >> normal flora   ABX: Cefepime  4/23 >> 5/6 vanc 4/20 >> 4/25; 4/29 >>5/4; 5/6 >>5/13 Fluconazole 5/7 >>5/14 Meropenem 5/7 >> 5/15  Imaging: CTA PE 4/14 >> small RLL PE Echo 4/24: EF 45% with Grade 2 DD [facility] 5/13 and 5/15 >> chronic microvascular ischemia, no acute intracranial abnormality  CT chest 5/24: BILATERAL pleural effusions and scattered atelectasis greatest in LEFT lower lobe.Pleural calcifications in hemithoraces bilaterally question asbestos exposure.Stranding of the anterior mediastinum likely related to prior cardiac surgery and sternal dehiscence, with herniation of fat through the sternotomy.Diffuse stranding along the RIGHT pectoralis major and minor muscles which could be related to prior surgery, hemorrhage, or infection. 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  LDA: L  temporary HD line 5/7 >> 5/25 R midline PIV 5/6 >> out shiley trach 5/5> 5/28 #4 cuffless> cortrak left nare 5/3  >> Rectal tube >> 6/1 Wound vac over sternum >> 5/26 L IJ [facility] 5/25 >>  Interim history/subjective:   Swallowing eval with SLP - nectar thickened   Pt states he wants some water. States that breathing is easy, is able to manage secretions.   Objective   Blood pressure (!) 142/70, pulse (!) 103, temperature 98.5 F (36.9 C), temperature source Oral, resp. rate 19, height 5\' 9"  (1.753 m), weight 84.5 kg, SpO2 99 %.    FiO2 (%):  [21 %] 21 %   Intake/Output Summary (Last 24 hours) at 10/02/2019 1041 Last  data filed at 10/02/2019 0325 Gross per 24 hour  Intake 863.75 ml  Output 0 ml  Net 863.75 ml   Filed Weights   09/30/19 1054 10/01/19 0334 10/02/19 0325  Weight: 77.8 kg 81.2 kg 84.5 kg    Examination:  General: Chronically ill appearing elderly male,  HEENT: NCAT. Cortrak secure L nare. #4 cuffless trach secure, capped  Neuro: AAO x3, following commands, interactive   CV: RRR s1s2. Midline sternal dressing c/d/i  PULM:   Diminished bibasilar sounds. CTA bilaterally. No accessory muscle use on RA  GI: soft round ndnt + bowel sounds  Extremities: no cyanosis or clubbing  Skin: c/d/w without rash   Resolved   Cardiac tamponade on 4/23 -Status post open chest for washout and clot removal. Chest closure 5/12 with muscle flap per plastic surgery Postoperative atrial flutter status post cardioversion 4/22 Cardiogenic shock ATN  Assessment & Plan:   Tracheostomy status -Post operative respiratory failure requiring prolonged mechanical ventilation, s/p tracheostomy 08/30/19-- now with vent liberation, tracheostomy downsizing 5/28, trach capping x several days  Chronic hypoxemic respiratory failure -COPD, Restrictive chest physiology s/p sternotomy  -mixed restrictive and obstructive defect, FEV1 50% pre-op  P:  -Has been tolerating capping x several days-- ideally would like to decannulate when pt more ambulatory/stronger. If approaching discharge will decannulate sooner -routine trach care, continue capping -Pulm hygiene -continue nebs -SLP following -- nectar thick   -Continue PT/OT    Additional medical issues managed by primary : -ATN now HD dependent, dysphagia, mediastinal hematoma post washout, A-Fib, deconditioning  -Awaiting placement     Eliseo Gum MSN, AGACNP-BC [place] [phone] If no answer, [phone] 10/02/2019, 10:41 AM

## 2019-10-02 NOTE — Procedures (Signed)
Objective Swallowing Evaluation: Type of Study: FEES-Fiberoptic Endoscopic Evaluation of Swallow   Patient Details  Name: Christian [name] MRN: [id number] Date of Birth: 24-Oct-1945  Today's Date: 10/02/2019 Time: SLP Start Time (ACUTE ONLY): 0910 -SLP Stop Time (ACUTE ONLY): 4268  SLP Time Calculation (min) (ACUTE ONLY): 44 min   Past Medical History:  Past Medical History:  Diagnosis Date  . BPH (benign prostatic hypertrophy)   . Coronary artery disease   . Diabetes mellitus without complication ([place])   . Dry eyes left  . Hiatal hernia   . 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  LIMA TO LAD, RIMA TO PDA, RADIAL ARTERY TO CIRC AND SEQUENTIALLY TO OM1. 08/15/2019  . Hyperlipidemia   . Hypertension   . Incomplete bladder emptying   . Nocturia   . 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 SOB (shortness of breath) on exertion    Past Surgical History:  Past Surgical History:  Procedure Laterality Date  . APPLICATION OF WOUND VAC N/A 08/24/2019   Procedure: APPLICATION OF WOUND VAC;  Surgeon: [name], MD;  Location: [place];  Service: Thoracic;  Laterality: N/A;  . APPLICATION OF WOUND VAC  08/29/2019   Procedure: Wound Vac change;  Surgeon: [name], MD;  Location: [place] OR;  Service: Open Heart Surgery;;  . APPLICATION OF WOUND VAC N/A 09/04/2019   Procedure: Application Of Wound Vac;  Surgeon: Grace Teofilo, MD;  Location: [place];  Service: Open Heart Surgery;  Laterality: N/A;  . APPLICATION OF WOUND VAC N/A 09/06/2019   Procedure: APPLICATION OF ACELL, APPLICATION OF WOUND VAC USING PREVENA INCISIONAL  DRESSING;  Surgeon: Wallace Going, DO;  Location: [place];  Service: Plastics;  Laterality: N/A;  . CARDIAC CATHETERIZATION    . CORONARY ARTERY BYPASS GRAFT N/A 08/15/2019   Procedure: CORONARY ARTERY BYPASS GRAFTING (CABG), x  4 using bilateral IMAs and left radial artery .  LIMA TO LAD, RIMA TO PDA, RADIAL ARTERY TO CIRC AND SEQUENTIALLY TO OM1.;  Surgeon: [name], MD;  Location: [place];  Service: Open Heart Surgery;  Laterality: N/A;  . CORONARY STENT PLACEMENT  02/27/2014   distal rt/pd coronary       dr Einar Gip  . CYSTO/ BLADDER BIOPSY'S/ CAUTHERIZATION  01-14-2004  DR Gaynelle Arabian  . EXPLORATION POST OPERATIVE OPEN HEART N/A 08/16/2019   Procedure: Chest Closure S?P CABG WITH APPLICATION OF PREVENA  INCISIONAL WOUND VAC;  Surgeon: [name], MD;  Location: [facility];  Service: Open Heart Surgery;  Laterality: N/A;  . EXPLORATION POST OPERATIVE OPEN HEART N/A 08/21/2019   Procedure: CHEST WASHOUT S/P OPEN CHEST;  Surgeon: [name], MD;  Location: [place];  Service: Open Heart Surgery;  Laterality: N/A;  Open chest with Esmark dressing with Ioban sealant coverage.  . EXPLORATION POST OPERATIVE OPEN HEART N/A 08/18/2019   Procedure: EXPLORATION POST OPERATIVE OPEN HEART (performed 04/23 on unit);  Surgeon: [name], MD;  Location: [place];  Service: Open Heart Surgery;  Laterality: N/A;  . EXPLORATION POST OPERATIVE OPEN HEART N/A 08/24/2019   Procedure: CHEST WASHOUT POST OPERATIVE OPEN HEART;  Surgeon: [name], MD;  Location: [place];  Service: Open Heart Surgery;  Laterality: N/A;  . EXPLORATION POST OPERATIVE OPEN HEART N/A 08/29/2019   Procedure: CHEST WOUND WASHOUT POST  OPERATIVE OPEN HEART;  Surgeon: [name], MD;  Location: [facility] OR;  Service: Open Heart Surgery;  Laterality: N/A;  . EXPLORATION POST OPERATIVE OPEN HEART N/A 09/04/2019   Procedure: MEDIASTINAL EXPLORATION WITH STERNAL WOUND IRRIGATION;  Surgeon: Grace Boruch, MD;  Location: [place];  Service: Open Heart Surgery;  Laterality: N/A;  . EXPLORATION POST OPERATIVE OPEN HEART N/A 09/14/2019   Procedure: EVACUATION OF HEMATOMA;  Surgeon: Grace Marcquis, MD;  Location: [place];  Service: Open Heart Surgery;   Laterality: N/A;  . IR FLUORO GUIDE CV LINE LEFT  09/19/2019  . IR PATIENT EVAL TECH 0-60 MINS  09/29/2019  . IR US GUIDE VASC ACCESS LEFT  09/19/2019  . LAPAROSCOPIC LYSIS OF ADHESIONS N/A 09/06/2019   Procedure: LAPAROSCOPIC OMENTAL HARVEST;  Surgeon: Kinsinger, Arta Bruce, MD;  Location: [place];  Service: General;  Laterality: N/A;  . LEFT HEART CATH AND CORONARY ANGIOGRAPHY N/A 08/10/2019   Procedure: LEFT HEART CATH AND CORONARY ANGIOGRAPHY;  Surgeon: Nigel Mormon, MD;  Location: [place] CV LAB;  Service: Cardiovascular;  Laterality: N/A;  . LEFT HEART CATHETERIZATION WITH CORONARY ANGIOGRAM N/A 02/27/2014   Procedure: LEFT HEART CATHETERIZATION WITH CORONARY ANGIOGRAM;  Surgeon: Laverda Page, MD;  Location: [facility] CATH LAB;  Service: Cardiovascular;  Laterality: N/A;  . MEDIASTINAL EXPLORATION N/A 09/06/2019   Procedure: MEDIASTINAL EXPLORATION;  Surgeon: Grace Franke, MD;  Location: [place];  Service: Thoracic;  Laterality: N/A;  . PECTORALIS FLAP  09/06/2019   Procedure: Pectoralis ADVANCEMENT Flap;  Surgeon: Wallace Going, DO;  Location: [place];  Service: Plastics;;  . PERCUTANEOUS CORONARY STENT INTERVENTION (PCI-S)  02/27/2014   Procedure: PERCUTANEOUS CORONARY STENT INTERVENTION (PCI-S);  Surgeon: Laverda Page, MD;  Location: [facility] CATH LAB;  Service: Cardiovascular;;  rt PDA  3.0/28mm Promus stent  . RADIAL ARTERY HARVEST Left 08/15/2019   Procedure: Radial Artery Harvest;  Surgeon: [name], MD;  Location: [place];  Service: Open Heart Surgery;  Laterality: Left;  . RIB PLATING N/A 09/06/2019   Procedure: STERNAL PLATING;  Surgeon: Grace Fermin, MD;  Location[place];  Service: Thoracic;  Laterality: N/A;  . TEE WITHOUT CARDIOVERSION N/A 08/15/2019   Procedure: TRANSESOPHAGEAL ECHOCARDIOGRAM (TEE);  Surgeon: [name], MD;  Location: [place];  Service: Open Heart Surgery;  Laterality: N/A;  . TRACHEOSTOMY TUBE PLACEMENT  08/29/2019   Procedure:  Tracheostomy;  Surgeon: [name], MD;  Location: [place] OR;  Service: Open Heart Surgery;;  . TRANSURETHRAL RESECTION OF PROSTATE  04/04/2012   Procedure: TRANSURETHRAL RESECTION OF THE PROSTATE WITH GYRUS INSTRUMENTS;  Surgeon: Ailene Rud, MD;  Location: [place];  Service: Urology;  Laterality: N/A;  . TRANSURETHRAL RESECTION OF PROSTATE N/A 09/27/2014   Procedure: TRANSURETHRAL RESECTION OF THE PROSTATE ;  Surgeon: Carolan Clines, MD;  Location: [facility];  Service: Urology;  Laterality: N/A;  . UPPER GASTROINTESTINAL ENDOSCOPY     HPI: 74 yo admitted with NSTEMI 4/15, RLL PE, 4/20 CABG x 4 remained open with wound VAC with repeated washouts. 4/23 cardiac arrest, 4/29 initiated CRRT, vent s/p trach. 5/10 additional sternal washout unable to close.  5/12 sternal plating with pectoralis flap and omental harvest to mediastinum off CRRT with iHD started. Chest wound closed. Neurology consulted for left sided weakness- CTH neg 09/08/19. 5/20 pt with emergent hematoma evacuation and transition to trach collar; 5/28 downsized to #4 cuffless. PMHx: BPH, CAD, DM, HLD, HTN Had MBS on 09/30/19,  weakness, poor visualization of airway, could not recommend thin liquids. FEES repeated to determine best diet textures.    Subjective: Awake, alert    Assessment / Plan / Recommendation  CHL IP CLINICAL IMPRESSIONS 10/02/2019  Clinical Impression F/u FEES today after inconclusive MBS this weekend. Pt alert, responsive, states he wants to start eating and drinking, but is generally weak, with very poor trunk/head/neck support. Upon direct visualization of pts airway, pt pharynx is turned and compressed, due to resting head position. He can swallow this way, but if more upright position can be achieved with support, pt will have a safer swallow position.  Pt also noted to have standing pharyngeal secretions that he cleared after a few sips of liquid and some hard coughing in response to scope.  There was no aspiration of nectar thick liquids, pt sensed frank penetration with thin and puree  with a cough. Mild residue remains in the valleculae and lateral channels due to position and generalized weakness. Pt clears with a second swallow or liquid wash. Expect use of swallow mechanism to gradually improve with intake, SLP therapy for effortful swallows, modified Shaker if possible. For now, recommend puree and nectar thick liquids with potential to advance solids with demonstration of tolerance.   SLP Visit Diagnosis Dysphagia, oropharyngeal phase (R13.12)  Attention and concentration deficit following --  Frontal lobe and executive function deficit following --  Impact on safety and function Mild aspiration risk      CHL IP TREATMENT RECOMMENDATION 10/02/2019  Treatment Recommendations Therapy as outlined in treatment plan below     Prognosis 10/02/2019  Prognosis for Safe Diet Advancement Good  Barriers to Reach Goals --  Barriers/Prognosis Comment --    CHL IP DIET RECOMMENDATION 10/02/2019  SLP Diet Recommendations Dysphagia 1 (Puree) solids;Nectar thick liquid  Liquid Administration via Cup;Straw  Medication Administration --  Compensations Slow rate;Small sips/bites;Effortful swallow;Follow solids with liquid  Postural Changes Remain semi-upright after after feeds/meals (Comment)      CHL IP OTHER RECOMMENDATIONS 10/02/2019  Recommended Consults --  Oral Care Recommendations Oral care BID  Other Recommendations Order thickener from pharmacy;Have oral suction available      CHL IP FOLLOW UP RECOMMENDATIONS 09/26/2019  Follow up Recommendations 24 hour supervision/assistance      CHL IP FREQUENCY AND DURATION 10/02/2019  Speech Therapy Frequency (ACUTE ONLY) min 2x/week  Treatment Duration 2 weeks           CHL IP ORAL PHASE 10/02/2019  Oral Phase Impaired  Oral - Pudding Teaspoon --  Oral - Pudding Cup --  Oral - Honey Teaspoon --  Oral - Honey Cup --  Oral - Nectar  Teaspoon Piecemeal swallowing;Lingual/palatal residue;Decreased bolus cohesion  Oral - Nectar Cup --  Oral - Nectar Straw Piecemeal swallowing;Decreased bolus cohesion  Oral - Thin Teaspoon --  Oral - Thin Cup NT  Oral - Thin Straw Decreased bolus cohesion  Oral - Puree Decreased bolus cohesion;Piecemeal swallowing  Oral - Mech Soft --  Oral - Regular NT  Oral - Multi-Consistency --  Oral - Pill --  Oral Phase - Comment --    CHL IP PHARYNGEAL PHASE 10/02/2019  Pharyngeal Phase Impaired  Pharyngeal- Pudding Teaspoon --  Pharyngeal --  Pharyngeal- Pudding Cup --  Pharyngeal --  Pharyngeal- Honey Teaspoon --  Pharyngeal --  Pharyngeal- Honey Cup --  Pharyngeal --  Pharyngeal- Nectar Teaspoon Reduced epiglottic inversion;Lateral channel residue;Pharyngeal residue - valleculae;Reduced tongue base retraction  Pharyngeal Material does not enter airway  Pharyngeal- Nectar Cup --  Pharyngeal --  Pharyngeal- Nectar Straw Reduced epiglottic inversion;Lateral channel residue;Pharyngeal residue - valleculae;Reduced tongue base retraction  Pharyngeal --  Pharyngeal- Thin Teaspoon NT  Pharyngeal --  Pharyngeal- Thin Cup NT  Pharyngeal --  Pharyngeal- Thin Straw Reduced epiglottic inversion;Lateral channel residue;Pharyngeal residue - valleculae;Reduced tongue base retraction;Penetration/Aspiration during swallow  Pharyngeal Material enters airway, CONTACTS cords and then ejected out;Material does not enter airway  Pharyngeal- Puree Reduced epiglottic inversion;Lateral channel residue;Pharyngeal residue - valleculae;Reduced tongue base retraction;Penetration/Aspiration during swallow  Pharyngeal Material enters airway, remains ABOVE vocal cords and not ejected out;Material enters airway, CONTACTS cords and then ejected out  Pharyngeal- Mechanical Soft --  Pharyngeal --  Pharyngeal- Regular NT  Pharyngeal --  Pharyngeal- Multi-consistency --  Pharyngeal --  Pharyngeal- Pill --  Pharyngeal  --  Pharyngeal Comment --     No flowsheet data found.  Herbie Baltimore, MA CCC-SLP  Acute Rehabilitation Services Pager [phone] Office [phone]  Lynann Beaver 10/02/2019, 10:13 AM

## 2019-10-03 ENCOUNTER — Encounter: Payer: Self-pay | Admitting: Family

## 2019-10-03 LAB — CBC
HCT: 23.7 % — ABNORMAL LOW (ref 39.0–52.0)
Hemoglobin: 7.4 g/dL — ABNORMAL LOW (ref 13.0–17.0)
MCH: 31.2 pg (ref 26.0–34.0)
MCHC: 31.2 g/dL (ref 30.0–36.0)
MCV: 100 fL (ref 80.0–100.0)
Platelets: 500 10*3/uL — ABNORMAL HIGH (ref 150–400)
RBC: 2.37 MIL/uL — ABNORMAL LOW (ref 4.22–5.81)
RDW: 18.2 % — ABNORMAL HIGH (ref 11.5–15.5)
WBC: 17.6 10*3/uL — ABNORMAL HIGH (ref 4.0–10.5)
nRBC: 0 % (ref 0.0–0.2)

## 2019-10-03 LAB — RENAL FUNCTION PANEL
Albumin: 2.4 g/dL — ABNORMAL LOW (ref 3.5–5.0)
Anion gap: 19 — ABNORMAL HIGH (ref 5–15)
BUN: 174 mg/dL — ABNORMAL HIGH (ref 8–23)
CO2: 22 mmol/L (ref 22–32)
Calcium: 8.9 mg/dL (ref 8.9–10.3)
Chloride: 91 mmol/L — ABNORMAL LOW (ref 98–111)
Creatinine, Ser: 7.51 mg/dL — ABNORMAL HIGH (ref 0.61–1.24)
GFR calc Af Amer: 8 mL/min — ABNORMAL LOW (ref >60)
GFR calc non Af Amer: 7 mL/min — ABNORMAL LOW (ref >60)
Glucose, Bld: 190 mg/dL — ABNORMAL HIGH (ref 70–99)
Phosphorus: 8.7 mg/dL — ABNORMAL HIGH (ref 2.5–4.6)
Potassium: 4.2 mmol/L (ref 3.5–5.1)
Sodium: 132 mmol/L — ABNORMAL LOW (ref 135–145)

## 2019-10-03 LAB — GLUCOSE, CAPILLARY
Glucose-Capillary: 192 mg/dL — ABNORMAL HIGH (ref 70–99)
Glucose-Capillary: 179 mg/dL — ABNORMAL HIGH (ref 70–99)
Glucose-Capillary: 238 mg/dL — ABNORMAL HIGH (ref 70–99)
Glucose-Capillary: 273 mg/dL — ABNORMAL HIGH (ref 70–99)

## 2019-10-03 LAB — MAGNESIUM: Magnesium: 3 mg/dL — ABNORMAL HIGH (ref 1.7–2.4)

## 2019-10-03 MED ORDER — CEFAZOLIN SODIUM-DEXTROSE 2-4 GM/100ML-% IV SOLN: 2.0000 g | INTRAVENOUS | Status: AC

## 2019-10-03 MED ORDER — DARBEPOETIN ALFA 100 MCG/0.5ML IJ SOSY
PREFILLED_SYRINGE | INTRAMUSCULAR | Status: AC
  Administered 2019-10-03: 100 ug via INTRAVENOUS
  Filled 2019-10-03: qty 0.5

## 2019-10-03 NOTE — TOC Progression Note (Signed)
Transition of Care [facility]) - Progression Note    Patient Details  Name: Christian [name] MRN: [id number] Date of Birth: 24-Mar-1946  Transition of Care [facility]) CM/SW Contact  Zenon Mayo, RN Phone Number: 10/03/2019, 4:55 PM  Clinical Narrative:    Patient transferred from [facility], he conts with cortrak tube feeds, trach.  Plan for peg tube this week per notes, advanced diet to dysp 1 nector thick, was planning to decannulate ,but now on hold .  Plan will be for SNF when medically stable , [facility] has said they will have a bed for this patient.   Expected Discharge Plan: [place] Barriers to Discharge: Continued Medical Work up  Expected Discharge Plan and Services Expected Discharge Plan: [place] arrangements for the past 2 months: Single Family Home                                       Social Determinants of Health (SDOH) Interventions    Readmission Risk Interventions No flowsheet data found.

## 2019-10-03 NOTE — Progress Notes (Signed)
SLP Cancellation Note  Patient Details Name: JOBY RICHART MRN: [id number] DOB: [birth year]   Cancelled treatment:       Reason Eval/Treat Not Completed: Patient at procedure or test/unavailable, in HD. Will f/u tomorrow.    Awab Abebe, Katherene Ponto 10/03/2019, 7:30 AM

## 2019-10-03 NOTE — Progress Notes (Signed)
Patient ID: Christian [name], male   DOB: [birth year], 74 y.o.   MRN: [id number]   Request made for percutaneous G tube in IR  Pt has had Speech eval Now on diet  Will cancel for now Spoke to Evonnie Pat PA-- please re order if feel needed  He agrees

## 2019-10-03 NOTE — Progress Notes (Signed)
Physical Therapy Treatment Patient Details Name: Christian [name] MRN: [id number] DOB: May 22, 1945 Today's Date: 10/03/2019    History of Present Illness Pt is a 74 y.o. admitted 08/10/19 with STEMI, RLL PE. S/p CABGx4 on 4/20, remained open with wound VAC with repeated washouts. 08/18/19 cardiac arrest, 08/24/19 initiated CRRT, vent s/p trach. 09/04/19 additional sternal washout unable to close. 5/12 sternal plating with pectoralis flap and omental harvest to mediastinum; off CRRT with iHD started. Chest wound closed. Neurology consulted for acute L-sided weakness- head CT 09/08/19 negative for acute injury. 09/14/19 pt with emergent hematoma evacuation and transition to trach collar; 09/18/19 downsized to 6 trach; 09/22/19 downsized to cuffless trach. PMHx: BPH, CAD, DM, HLD, HTN    PT Comments    Pt presented in bed with request to adjust positioning due to pain on his backside. Pt agreeable to lift into chair, requiring max(A) to roll side to side for maxi move pad placement and pericare, and +2 for safety for the transfer. Pt required verbal and tactile cueing for reminders for sternal precautions and adjusting positioning in chair. Pt continues demonstrate left sided lean and difficulty moving trunk to neutral. Will continue to follow acutely until d/c to next venue of care.    Follow Up Recommendations  Supervision/Assistance - 24 hour;SNF     Equipment Recommendations  Other (comment)(defer to next venue)    Recommendations for Other Services       Precautions / Restrictions Precautions Precautions: Fall;Sternal Precaution Comments: trach    Mobility  Bed Mobility Overal bed mobility: Needs Assistance Bed Mobility: Rolling Rolling: Max assist         General bed mobility comments: Pt required max(A) to roll in bed, dependent to roll in recliner. Dependent for repositioning with to moving up in recliner. Rolled x 4 for maxi move pad placement and pericare.  Transfers  Overall transfer level: Needs assistance               General transfer comment: Transfered from bed to recliner with maxi move +2 for safety, equipment  Ambulation/Gait             General Gait Details: unable   Stairs             Wheelchair Mobility    Modified Rankin (Stroke Patients Only)       Balance Overall balance assessment: Needs assistance Sitting-balance support: Bilateral upper extremity supported;No upper extremity supported;Feet supported Sitting balance-Leahy Scale: Zero Sitting balance - Comments: pt with posterior left lean in recliner with mod -max assist for trunk support and repositioning to neutral. Postural control: Left lateral lean   Standing balance-Leahy Scale: Zero Standing balance comment: Pt unable to stand                            Cognition Arousal/Alertness: Awake/alert;Lethargic Behavior During Therapy: Flat affect Overall Cognitive Status: Difficult to assess Area of Impairment: Problem solving;Following commands;Attention                   Current Attention Level: Focused   Following Commands: Follows one step commands with increased time;Follows multi-step commands with increased time;Follows multi-step commands inconsistently     Problem Solving: Slow processing;Requires verbal cues;Requires tactile cues;Decreased initiation General Comments: Pt demonstrates decreased initation and increased movement time with left side of body. Pt requires verbal and tactile cues for mobility. Unaware of bowel incontience.      Exercises Total Joint Exercises  Ankle Circles/Pumps: AROM;Both;5 reps;Seated    General Comments General comments (skin integrity, edema, etc.): Pt wife present in room during session. Appeared to have sacral skin breakdown, nursing notified. VSS during session, Pt on RA.      Pertinent Vitals/Pain Pain Assessment: Faces Faces Pain Scale: Hurts even more  Pain Location: extremities with certain movements/ROM, nose with movement of cortrak, "bottom" Pain Descriptors / Indicators: Discomfort;Grimacing;Guarding;Moaning;Constant Pain Intervention(s): Limited activity within patient's tolerance;Monitored during session;Repositioned    Home Living                      Prior Function            PT Goals (current goals can now be found in the care plan section) Acute Rehab PT Goals Time For Goal Achievement: 10/10/19 Potential to Achieve Goals: Fair Progress towards PT goals: Progressing toward goals    Frequency    Min 2X/week      PT Plan Current plan remains appropriate    Co-evaluation              AM-PAC PT "6 Clicks" Mobility   Outcome Measure  Help needed turning from your back to your side while in a flat bed without using bedrails?: Total Help needed moving from lying on your back to sitting on the side of a flat bed without using bedrails?: Total Help needed moving to and from a bed to a chair (including a wheelchair)?: Total Help needed standing up from a chair using your arms (e.g., wheelchair or bedside chair)?: Total Help needed to walk in hospital room?: Total Help needed climbing 3-5 steps with a railing? : Total 6 Click Score: 6    End of Session   Activity Tolerance: Patient limited by lethargy Patient left: with family/visitor present;in chair;with call bell/phone within reach Nurse Communication: Mobility status;Need for lift equipment;Other (comment)(Pt requesting water and cream for bottom) PT Visit Diagnosis: Other abnormalities of gait and mobility (R26.89);Muscle weakness (generalized) (M62.81);Other symptoms and signs involving the nervous system (R29.898)     Time: 3716-9678 PT Time Calculation (min) (ACUTE ONLY): 41 min  Charges:  $Therapeutic Activity: 38-52 mins                     [employer] SPT 10/03/2019    Rolland Porter 10/03/2019, 5:45 PM

## 2019-10-03 NOTE — Progress Notes (Signed)
  Scheduled for percutaneous gastric tube placement in IR See consult note 09/29/19  Pt has been waiting for G tube in IR Wife had asked to wait til this week  Speech evaluated pt and has ordered diet  I have spoken to TCTS--- they feel like he will not get adequate calories with soft/thick nectar diet. Has asked IR to move ahead with placement   Will plan for 6/9 Afeb Wbc trending down (17)   New orders placed

## 2019-10-03 NOTE — Progress Notes (Addendum)
Patient ID: Christian [name], male   DOB: 1945/12/05, 74 y.o.   MRN: [id number]  [facility] Progress Note   Assessment/ Plan:   1. Acute kidney Injury: Anuric, multifactorial/multiphasic following coronary angiography, CABG with cardiogenic shock and cardiac arrest.  Earlier on CRRT from 4/29-5/12 and now on intermittent hemodialysis with no evidence of renal recovery so far.  He has a left IJ TDC and is getting dialysis today on his TTS schedule here with plans to continue this upon DC as OP.  Plans noted for SNF admission- ST re-assessed patient and is now on a dysphagia diet with aspiration precautions- PEG tube cancelled. 2.  Coronary artery disease status post four-vessel CABG: Complicated by cardiac tamponade with repeat thoracotomy for evacuation of hematoma/blood in chest. 3.  Postoperative acute hypoxic respiratory failure with history of COPD: Tracheostomy has been capped and oxygenation appears to be satisfactory. CCM planning decannulation. 4.  Anemia of critical illness: Hemoglobin and hematocrit noted; will transfuse 1 unit PRBC with hemodialysis today. No overt loss.  5.  History of ischemic cardiomyopathy with acute systolic heart failure  Subjective:   Complains of poor sleep overnight with "soreness all over"    Objective:   BP (!) 98/56   Pulse 99   Temp 98.4 F (36.9 C) (Oral)   Resp 16   Ht 5\' 9"  (1.753 m)   Wt 88 kg   SpO2 100%   BMI 28.65 kg/m   Intake/Output Summary (Last 24 hours) at 10/03/2019 0806 Last data filed at 10/03/2019 0415 Gross per 24 hour  Intake 1337.25 ml  Output -  Net 1337.25 ml   Weight change: 5 kg  Physical Exam: Gen: Comfortably resting in dialysis CVS: Pulse regular tachycardia, S1 and S2 normal.  Left IJ TDC in situ Resp: Anteriorly clear to auscultation, no rales/rhonchi. Tracheostomy tube in place.  Abd: Soft, flat, nontender Ext: Both legs in cushioned booties, trace edema.  Imaging: No results found.  Labs:  BMET Recent Labs  Lab 09/27/19 0219 09/28/19 0445 09/29/19 0247 09/30/19 0228 10/01/19 0510 10/02/19 0149 10/03/19 0303  NA 133* 134* 133* 134* 136 136 132*  K 4.0 4.3 4.5 4.9 3.9 4.2 4.2  CL 95* 95* 93* 96* 96* 95* 91*  CO2 25 23 24 22 23 24 22   GLUCOSE 174* 147* 175* 192* 195* 186* 190*  BUN 85* 142* 91* 135* 83* 146* 174*  CREATININE 4.27* 6.08* 4.77* 6.76* 4.54* 6.20* 7.51*  CALCIUM 8.8* 8.9 9.0 9.0 9.1 8.9 8.9  PHOS 4.5 5.3* 4.8* 7.0* 6.0* 7.5* 8.7*   CBC Recent Labs  Lab 09/26/19 1116 09/29/19 0756 09/30/19 0702 10/02/19 0149  WBC 18.5* 19.9* 18.7* 17.9*  HGB 8.7* 8.2* 7.6* 7.5*  HCT 27.1* 26.3* 24.3* 24.1*  MCV 98.5 98.9 100.4* 99.2  PLT 338 393 413* 430*    Medications:    .  stroke: mapping our early stages of recovery book   Does not apply Once  . sodium chloride   Intravenous Once  . amiodarone  200 mg Per Tube Daily  . arformoterol  15 mcg Nebulization BID  . aspirin  81 mg Per Tube Daily  . atorvastatin  40 mg Per Tube Daily  . bisacodyl  10 mg Rectal Daily  . chlorhexidine  15 mL Mouth Rinse BID  . Chlorhexidine Gluconate Cloth  6 each Topical Q0600  . clonazePAM  0.5 mg Per Tube BID  . darbepoetin (ARANESP) injection - DIALYSIS  100 mcg Intravenous Q Tue-HD  .  feeding supplement (PRO-STAT SUGAR FREE 64)  60 mL Per Tube TID  . insulin aspart  0-15 Units Subcutaneous Q4H  . insulin detemir  12 Units Subcutaneous BID  . mouth rinse  15 mL Mouth Rinse q12n4p  . midodrine  5 mg Per Tube TID WC  . multivitamin  1 tablet Oral QHS  . nystatin  5 mL Oral QID  . pantoprazole sodium  40 mg Per Tube Daily  . revefenacin  175 mcg Nebulization Daily  . sertraline  25 mg Per Tube Daily  . sevelamer carbonate  1.6 g Per Tube TID WC  . trospium  20 mg Per Tube BID   Elmarie Shiley, MD 10/03/2019, 8:06 AM

## 2019-10-03 NOTE — Procedures (Signed)
Patient seen on Hemodialysis. BP (!) 98/56   Pulse 99   Temp 98.4 F (36.9 C) (Oral)   Resp 16   Ht 5\' 9"  (1.753 m)   Wt 88 kg   SpO2 100%   BMI 28.65 kg/m   QB 400, UF goal 2L Tolerating treatment without complaints at this time.   Elmarie Shiley MD [facility]. Office # [phone] Pager # [phone] 8:06 AM

## 2019-10-03 NOTE — Plan of Care (Signed)
  Problem: Clinical Measurements: Goal: Will remain free from infection Outcome: Progressing Goal: Diagnostic test results will improve Outcome: Progressing Goal: Cardiovascular complication will be avoided Outcome: Progressing   Problem: Nutrition: Goal: Adequate nutrition will be maintained Outcome: Progressing   Problem: Elimination: Goal: Will not experience complications related to urinary retention Outcome: Progressing   Problem: Pain Managment: Goal: General experience of comfort will improve Outcome: Progressing

## 2019-10-03 NOTE — Progress Notes (Signed)
PT Cancellation Note  Patient Details Name: TRENDON ZARING MRN: [id number] DOB: Sep 11, 1945   Cancelled Treatment:    Reason Eval/Treat Not Completed: Patient at procedure or test/unavailable (HD). Will follow-up for PT treatment as schedule permits.  Mabeline Caras, PT, DPT Acute Rehabilitation Services  Pager [phone] Office [place] 10/03/2019, 7:37 AM

## 2019-10-03 NOTE — Progress Notes (Signed)
[address]       [employer] [address]             [phone]      19 Days Post-Op Procedure(s) (LRB): EVACUATION OF HEMATOMA (N/A) Subjective: Dialysis this am- says he tolerated ok Remains quite weak Starting to eat some D1 diet Plan for IR to place Gtube this week  Objective: Vital signs in last 24 hours: Temp:  [97.6 F (36.4 C)-98.5 F (36.9 C)] 98.1 F (36.7 C) (06/08 1259) Pulse Rate:  [96-107] 105 (06/08 1259) Cardiac Rhythm: Normal sinus rhythm (06/08 0705) Resp:  [16-23] 19 (06/08 1259) BP: (93-150)/(37-84) 131/84 (06/08 1259) SpO2:  [94 %-100 %] 96 % (06/08 1259) FiO2 (%):  [21 %] 21 % (06/08 1244) Weight:  [86 kg-89.5 kg] 86 kg (06/08 1112)  Hemodynamic parameters for last 24 hours:    Intake/Output from previous day: 06/07 0701 - 06/08 0700 In: 1337.3 [NG/GT:1117.3; IV Piggyback:220] Out: -  Intake/Output this shift: Total I/O In: 630 [Blood:630] Out: 2000 [Other:2000]  General appearance: alert, cooperative, distracted, fatigued and no distress Heart: regular rate and rhythm Lungs: some upper airway ronchi Abdomen: mild distension, nontender Extremities: trace edema Wound: Incis stable without signs of infection  Lab Results: Recent Labs    10/02/19 0149 10/03/19 0759  WBC 17.9* 17.6*  HGB 7.5* 7.4*  HCT 24.1* 23.7*  PLT 430* 500*   BMET:  Recent Labs    10/02/19 0149 10/03/19 0303  NA 136 132*  K 4.2 4.2  CL 95* 91*  CO2 24 22  GLUCOSE 186* 190*  BUN 146* 174*  CREATININE 6.20* 7.51*  CALCIUM 8.9 8.9    PT/INR: No results for input(s): LABPROT, INR in the last 72 hours. ABG    Component Value Date/Time   PHART 7.481 (H) 09/07/2019 0029   HCO3 23.3 09/07/2019 0029   TCO2 24 09/07/2019 0029   ACIDBASEDEF 5.0 (H) 08/18/2019 2341   O2SAT 99.0 09/07/2019 0029   CBG (last 3)  Recent Labs    10/02/19 2357 10/03/19 0409 10/03/19 1156  GLUCAP 186* 192* 179*    Meds Scheduled Meds: .  stroke:  mapping our early stages of recovery book   Does not apply Once  . sodium chloride   Intravenous Once  . amiodarone  200 mg Per Tube Daily  . arformoterol  15 mcg Nebulization BID  . aspirin  81 mg Per Tube Daily  . atorvastatin  40 mg Per Tube Daily  . bisacodyl  10 mg Rectal Daily  . chlorhexidine  15 mL Mouth Rinse BID  . Chlorhexidine Gluconate Cloth  6 each Topical Q0600  . clonazePAM  0.5 mg Per Tube BID  . darbepoetin (ARANESP) injection - DIALYSIS  100 mcg Intravenous Q Tue-HD  . feeding supplement (PRO-STAT SUGAR FREE 64)  60 mL Per Tube TID  . insulin aspart  0-15 Units Subcutaneous Q4H  . insulin detemir  12 Units Subcutaneous BID  . mouth rinse  15 mL Mouth Rinse q12n4p  . midodrine  5 mg Per Tube TID WC  . multivitamin  1 tablet Oral QHS  . nystatin  5 mL Oral QID  . pantoprazole sodium  40 mg Per Tube Daily  . revefenacin  175 mcg Nebulization Daily  . sertraline  25 mg Per Tube Daily  . sevelamer carbonate  1.6 g Per Tube TID WC  . trospium  20 mg Per Tube BID   Continuous Infusions: .  sodium chloride Stopped (09/14/19 0802)  . [START ON 10/04/2019]  ceFAZolin (ANCEF) IV    . feeding supplement (NEPRO CARB STEADY) 1,000 mL (10/02/19 2036)  . ferric gluconate (FERRLECIT/NULECIT) IV Stopped (10/03/19 1213)  . lactated ringers 20 mL/hr at 08/20/19 1200   PRN Meds:.acetaminophen (TYLENOL) oral liquid 160 mg/5 mL, albuterol, dextrose, Gerhardt's butt cream, guaiFENesin, hydrALAZINE, hydrocortisone cream, HYDROmorphone (DILAUDID) injection, ondansetron (ZOFRAN) IV, oxyCODONE, phenol, Resource ThickenUp Clear, sennosides, sodium chloride flush  Xrays No results found.  Assessment/Plan: S/P Procedure(s) (LRB): EVACUATION OF HEMATOMA (N/A)  1 remains stable with slow physical recovery 2 SBP variable, sinus rhythm 3 says good on RA 4 transfusion today /HD- per nephrology management 5 G tube tomorrow  LOS: 55 days    John Giovanni PA-C Pager [phone]  [phone] 10/03/2019

## 2019-10-03 NOTE — Plan of Care (Signed)
  Problem: Education: Goal: Knowledge of General Education information will improve Description: Including pain rating scale, medication(s)/side effects and non-pharmacologic comfort measures Outcome: Progressing   Problem: Clinical Measurements: Goal: Ability to maintain clinical measurements within normal limits will improve Outcome: Progressing   Problem: Clinical Measurements: Goal: Diagnostic test results will improve Outcome: Progressing   Problem: Clinical Measurements: Goal: Cardiovascular complication will be avoided Outcome: Progressing   Problem: Nutrition: Goal: Adequate nutrition will be maintained Outcome: Progressing   Problem: Coping: Goal: Level of anxiety will decrease Outcome: Progressing   Problem: Pain Managment: Goal: General experience of comfort will improve Outcome: Progressing   Problem: Safety: Goal: Ability to remain free from injury will improve Outcome: Progressing   Problem: Skin Integrity: Goal: Risk for impaired skin integrity will decrease Outcome: Progressing   Problem: Education: Goal: Knowledge of disease or condition will improve Outcome: Progressing   Problem: Education: Goal: Knowledge of the prescribed therapeutic regimen will improve Outcome: Progressing   Problem: Clinical Measurements: Goal: Postoperative complications will be avoided or minimized Outcome: Progressing   Problem: Skin Integrity: Goal: Risk for impaired skin integrity will decrease Outcome: Progressing

## 2019-10-03 NOTE — Progress Notes (Signed)
Nutrition Follow-up  DOCUMENTATION CODES:   Not applicable  INTERVENTION:   - Vital Cuisine Shake TID with meal trays, each supplement provides 520 kcal and 22 grams of protein  - Encourage adequate PO intake  Continue continuous tube feeding via post-pyloric Cortrak: - Nepro @ 45 ml/hr (1080 ml/day) - Pro-stat 60 ml TID  Tube feeding regimen provides 2544 kcal, 177 grams of protein, and 788 ml of H2O (meets 100% of needs).  - Continue renal MVI  Once G-tube placed and cleared for use, consider whether pt can transition to a bolus tube feeding regimen. Phosphorus binders would need to be timed appropriately. Recommend: - 1 carton/ARC (237 ml) of Nepro QID - Pro-stat 60 ml TID  Bolus tube feeding regimen would provide 2306 kcal, 167 grams of protein, and 689 ml of H2O (100% of kcal needs, 98% of protein needs).  NUTRITION DIAGNOSIS:   Increased nutrient needs related to post-op healing as evidenced by estimated needs.  Ongoing, being addressed via TF  GOAL:   Patient will meet greater than or equal to 90% of their needs  Met via TF  MONITOR:   PO intake, Supplement acceptance, Diet advancement, Labs, Weight trends, TF tolerance, Skin, I & O's  REASON FOR ASSESSMENT:   Ventilator    ASSESSMENT:   Patient with PMH significant for CAD s/p stenting, HTN, HLD, DM, and BPH. Presents this admission with RLL PE and for CABG.  4/14 - admitted  4/20 - CABG x 4, chest left open 4/22 - chest closure, application of incisional wound VAC 4/23 - extubated, cardiac arrest, chest re-opened for washout, clot removed, re-intubated 4/26 - return to OR for washout, cortrak placed, TF initiated 4/29 - chest washout, application of wound VAC, CRRT initiated 5/05 - chest washout, tracheostomy 5/10 - chest washout, wound VAC 5/12 - omental harvest, R pectoral muscle advancement flap, layered closure of sternal wound, wound VAC, stop CRRT 5/13 - first HD 5/20 - mediastinal  exploration, evacuation of hematoma, wound VAC 5/24 - trach downsized 6 cuffless 5/25 - L tunneled HD cath 5/28 - trach downsized 4 cuffless  6/07 - diet advanced to dysphagia 1, nectar-thick liquids  Plan is for IR placement of G-tube tomorrow. Pt is lethargic and not eating well per RN even though diet has been advanced. Pt unlikely to be able to meet increased kcal and protein needs via PO intake alone so TCTS has decided to proceed with G-tube placement. No meal completions recorded since diet advanced. RD reports pt not eating much but is drinking some. RD will leave bolus tube feeding recommendations if desired once G-tube cleared for use.  Per [facility] documentation, pt refusing binders.  Post-pyloric Cortrak remains in place with TF infusing.  Spoke with pt and family member at bedside. Family member attempting to make pt comfortable in seated position in the bed. RD assisted family member in getting set up for lunch meal tray. Family member to feed pt from lunch meal tray.  Pt denies any N/V or abdominal pain at this time. He is eager to have Cortrak tube removed. Pt states that he does not feel hungry.  Current TF: Nepro Carb Steady @ 45 ml/hr, Pro-stat 60 ml TID  Medications reviewed and include: dulcolax, Aranesp, SSI q 4 hours, Levemir 12 units BID, rena-vit, protonix, Renvela TID, IV abx, ferric gluconate  Labs reviewed: sodium 132, phosphorus 8.7, magnesium 3.0, hemoglobin 7.4 CBG's: 167-192 x 24 hours  HD net UF today: 2000 ml Post-HD weight: 86  kg  Diet Order:   Diet Order            Diet NPO time specified Except for: Sips with Meds  Diet effective midnight        DIET - DYS 1 Room service appropriate? Yes; Fluid consistency: Nectar Thick  Diet effective now              EDUCATION NEEDS:   Not appropriate for education at this time  Skin:  Skin Assessment: Skin Integrity Issues: Stage II: right buttocks Incisions: chest, abdomen, neck, left arm  Last BM:   10/02/19 smear type 6  Height:   Ht Readings from Last 1 Encounters:  08/28/19 _0  (1.753 m)    Weight:   Wt Readings from Last 1 Encounters:  10/03/19 86 kg    BMI:  Body mass index is 28 kg/m.  Estimated Nutritional Needs:   Kcal:  2125-2550  Protein:  170-195 grams  Fluid:  >/= 2 L/day    Gaynell Face, MS, RD, LDN Inpatient Clinical Dietitian Pager: [phone] Weekend/After Hours: [phone]

## 2019-10-04 ENCOUNTER — Inpatient Hospital Stay (HOSPITAL_COMMUNITY): Payer: Medicare Other

## 2019-10-04 HISTORY — PX: IR GASTROSTOMY TUBE MOD SED: IMG625

## 2019-10-04 LAB — GLUCOSE, CAPILLARY
Glucose-Capillary: 118 mg/dL — ABNORMAL HIGH (ref 70–99)
Glucose-Capillary: 126 mg/dL — ABNORMAL HIGH (ref 70–99)
Glucose-Capillary: 132 mg/dL — ABNORMAL HIGH (ref 70–99)
Glucose-Capillary: 132 mg/dL — ABNORMAL HIGH (ref 70–99)
Glucose-Capillary: 185 mg/dL — ABNORMAL HIGH (ref 70–99)
Glucose-Capillary: 93 mg/dL (ref 70–99)
Glucose-Capillary: 99 mg/dL (ref 70–99)

## 2019-10-04 LAB — BPAM RBC
Blood Product Expiration Date: 202107112359
ISSUE DATE / TIME: 202106080942
Unit Type and Rh: 5100

## 2019-10-04 LAB — RENAL FUNCTION PANEL
Albumin: 2.6 g/dL — ABNORMAL LOW (ref 3.5–5.0)
Anion gap: 16 — ABNORMAL HIGH (ref 5–15)
BUN: 76 mg/dL — ABNORMAL HIGH (ref 8–23)
CO2: 23 mmol/L (ref 22–32)
Calcium: 9 mg/dL (ref 8.9–10.3)
Chloride: 97 mmol/L — ABNORMAL LOW (ref 98–111)
Creatinine, Ser: 4.26 mg/dL — ABNORMAL HIGH (ref 0.61–1.24)
GFR calc Af Amer: 15 mL/min — ABNORMAL LOW (ref >60)
GFR calc non Af Amer: 13 mL/min — ABNORMAL LOW (ref >60)
Glucose, Bld: 111 mg/dL — ABNORMAL HIGH (ref 70–99)
Phosphorus: 5.2 mg/dL — ABNORMAL HIGH (ref 2.5–4.6)
Potassium: 4.1 mmol/L (ref 3.5–5.1)
Sodium: 136 mmol/L (ref 135–145)

## 2019-10-04 LAB — TYPE AND SCREEN
ABO/RH(D): O POS
Antibody Screen: NEGATIVE
Unit division: 0

## 2019-10-04 LAB — PROTIME-INR
INR: 1.1 (ref 0.8–1.2)
Prothrombin Time: 13.9 seconds (ref 11.4–15.2)

## 2019-10-04 LAB — MAGNESIUM: Magnesium: 2.6 mg/dL — ABNORMAL HIGH (ref 1.7–2.4)

## 2019-10-04 MED ORDER — FENTANYL CITRATE (PF) 100 MCG/2ML IJ SOLN
INTRAMUSCULAR | Status: AC
  Filled 2019-10-04: qty 2

## 2019-10-04 MED ORDER — MIDAZOLAM HCL 2 MG/2ML IJ SOLN
INTRAMUSCULAR | Status: AC
  Filled 2019-10-04: qty 2

## 2019-10-04 MED ORDER — HYDROCODONE-ACETAMINOPHEN 5-325 MG PO TABS
1.0000 | ORAL_TABLET | ORAL | Status: DC | PRN
  Administered 2019-10-05: 1 via ORAL
  Administered 2019-10-07 – 2019-10-17 (×13): 2 via ORAL
  Administered 2019-10-17: 1 via ORAL
  Filled 2019-10-04 (×3): qty 2
  Filled 2019-10-04 (×2): qty 1
  Filled 2019-10-04 (×10): qty 2

## 2019-10-04 MED ORDER — LIDOCAINE HCL 1 % IJ SOLN
INTRAMUSCULAR | Status: AC
  Filled 2019-10-04: qty 20

## 2019-10-04 MED ORDER — ONDANSETRON HCL 4 MG/2ML IJ SOLN
4.0000 mg | INTRAMUSCULAR | Status: DC | PRN
  Administered 2019-10-06: 4 mg via INTRAVENOUS
  Filled 2019-10-04: qty 2

## 2019-10-04 MED ORDER — CEFAZOLIN SODIUM-DEXTROSE 2-4 GM/100ML-% IV SOLN
INTRAVENOUS | Status: AC
  Administered 2019-10-04: 2 g via INTRAVENOUS
  Filled 2019-10-04: qty 100

## 2019-10-04 MED ORDER — HYDROMORPHONE HCL 1 MG/ML IJ SOLN
1.0000 mg | INTRAMUSCULAR | Status: DC | PRN
  Administered 2019-10-04 – 2019-10-12 (×5): 1 mg via INTRAVENOUS
  Filled 2019-10-04 (×2): qty 1

## 2019-10-04 MED ORDER — GLUCAGON HCL RDNA (DIAGNOSTIC) 1 MG IJ SOLR
INTRAMUSCULAR | Status: AC
  Filled 2019-10-04: qty 1

## 2019-10-04 MED ORDER — IOHEXOL 300 MG/ML  SOLN
50.0000 mL | Freq: Once | INTRAMUSCULAR | Status: AC | PRN
  Administered 2019-10-04: 20 mL

## 2019-10-04 NOTE — Procedures (Signed)
  Procedure: Percutaneous gastrostomy catheter placement 59f EBL:   minimal Complications:  none immediate  See full dictation in [employer].  Dillard Cannon MD Main # [phone] Pager  [phone]

## 2019-10-04 NOTE — Progress Notes (Signed)
SLP Cancellation Note  Patient Details Name: Christian [name] MRN: [id number] DOB: [birth year]   Cancelled treatment:       Reason Eval/Treat Not Completed: Patient at procedure or test/unavailable. NPO for G tube today, will f/u for therapy to address oral intake.    Romaine Neville, Katherene Ponto 10/04/2019, 7:09 AM

## 2019-10-04 NOTE — Progress Notes (Signed)
[place]       [employer] [address]             [phone]      20 Days Post-Op Procedure(s) (LRB): EVACUATION OF HEMATOMA (N/A) Subjective: Feels ok, a little constipated   Objective: Vital signs in last 24 hours: Temp:  [97.6 F (36.4 C)-98.6 F (37 C)] 98.4 F (36.9 C) (06/09 0401) Pulse Rate:  [96-107] 99 (06/09 0714) Cardiac Rhythm: Normal sinus rhythm (06/09 0401) Resp:  [14-27] 14 (06/09 0714) BP: (93-140)/(38-84) 140/76 (06/09 0714) SpO2:  [94 %-100 %] 96 % (06/09 0714) FiO2 (%):  [21 %] 21 % (06/09 0714) Weight:  [83 kg-86 kg] 83 kg (06/09 0500)  Hemodynamic parameters for last 24 hours:    Intake/Output from previous day: 06/08 0701 - 06/09 0700 In: 1983.7 [P.O.:360; Blood:630; NG/GT:883.7; IV Piggyback:110] Out: 2000  Intake/Output this shift: No intake/output data recorded.  General appearance: alert, cooperative and no distress Heart: regular rate and rhythm Lungs: rhonchi upper airway Abdomen: soft, non-tender, non distended Extremities: no edema, boots in palce Wound: incis healing without signs of infection, some scabbing  Lab Results: Recent Labs    10/02/19 0149 10/03/19 0759  WBC 17.9* 17.6*  HGB 7.5* 7.4*  HCT 24.1* 23.7*  PLT 430* 500*   BMET:  Recent Labs    10/03/19 0303 10/04/19 0232  NA 132* 136  K 4.2 4.1  CL 91* 97*  CO2 22 23  GLUCOSE 190* 111*  BUN 174* 76*  CREATININE 7.51* 4.26*  CALCIUM 8.9 9.0    PT/INR:  Recent Labs    10/04/19 0232  LABPROT 13.9  INR 1.1   ABG    Component Value Date/Time   PHART 7.481 (H) 09/07/2019 0029   HCO3 23.3 09/07/2019 0029   TCO2 24 09/07/2019 0029   ACIDBASEDEF 5.0 (H) 08/18/2019 2341   O2SAT 99.0 09/07/2019 0029   CBG (last 3)  Recent Labs    10/03/19 2013 10/04/19 0014 10/04/19 0400  GLUCAP 273* 185* 99    Meds Scheduled Meds: .  stroke: mapping our early stages of recovery book   Does not apply Once  . sodium chloride    Intravenous Once  . amiodarone  200 mg Per Tube Daily  . arformoterol  15 mcg Nebulization BID  . aspirin  81 mg Per Tube Daily  . atorvastatin  40 mg Per Tube Daily  . bisacodyl  10 mg Rectal Daily  . chlorhexidine  15 mL Mouth Rinse BID  . Chlorhexidine Gluconate Cloth  6 each Topical Q0600  . clonazePAM  0.5 mg Per Tube BID  . darbepoetin (ARANESP) injection - DIALYSIS  100 mcg Intravenous Q Tue-HD  . feeding supplement (PRO-STAT SUGAR FREE 64)  60 mL Per Tube TID  . insulin aspart  0-15 Units Subcutaneous Q4H  . insulin detemir  12 Units Subcutaneous BID  . mouth rinse  15 mL Mouth Rinse q12n4p  . midodrine  5 mg Per Tube TID WC  . multivitamin  1 tablet Oral QHS  . nystatin  5 mL Oral QID  . pantoprazole sodium  40 mg Per Tube Daily  . revefenacin  175 mcg Nebulization Daily  . sertraline  25 mg Per Tube Daily  . sevelamer carbonate  1.6 g Per Tube TID WC  . trospium  20 mg Per Tube BID   Continuous Infusions: . sodium chloride Stopped (09/14/19 0802)  .  ceFAZolin (ANCEF) IV    .  feeding supplement (NEPRO CARB STEADY) Stopped (10/03/19 2354)  . ferric gluconate (FERRLECIT/NULECIT) IV Stopped (10/03/19 1213)  . lactated ringers 20 mL/hr at 08/20/19 1200   PRN Meds:.acetaminophen (TYLENOL) oral liquid 160 mg/5 mL, albuterol, dextrose, Gerhardt's butt cream, guaiFENesin, hydrALAZINE, hydrocortisone cream, HYDROmorphone (DILAUDID) injection, ondansetron (ZOFRAN) IV, oxyCODONE, phenol, Resource ThickenUp Clear, sennosides, sodium chloride flush  Xrays No results found.  Assessment/Plan: S/P Procedure(s) (LRB): EVACUATION OF HEMATOMA (N/A)  1 stable vitals, no fevers 2 G tube today 3 nephrology managing renal issues/HD 4 ST assisting with swallow management/po intake 5 conts PT/rehab 6 no new labs  LOS: 56 days    John Giovanni PA-C Pager [phone] 10/04/2019

## 2019-10-04 NOTE — Progress Notes (Signed)
Renal Navigator is following along for appropriate timing of OP HD referral.  Alphonzo Cruise, [place] Renal Navigator [phone]

## 2019-10-04 NOTE — Plan of Care (Signed)

## 2019-10-04 NOTE — Progress Notes (Signed)
Patient ID: Christian [name], male   DOB: 22-Jul-1945, 74 y.o.   MRN: [id number] [facility] Progress Note   Assessment/ Plan:   1. Acute kidney Injury-dialysis dependent for the last 5-1/2 weeks and to meet ESRD definition on 6/12: Anuric, multifactorial/multiphasic following coronary angiography, CABG with cardiogenic shock and cardiac arrest.  Earlier on CRRT from 4/29-5/12 and now on intermittent hemodialysis with no evidence of renal recovery so far.  Continue hemodialysis via left IJ TDC on TTS schedule while here in the hospital as we await for placement to SNF that will direct his outpatient dialysis unit placement.  Plans noted for PEG tube placement today. 2.  Coronary artery disease status post four-vessel CABG: Complicated by cardiac tamponade with repeat thoracotomy for evacuation of hematoma/blood in chest. 3.  Postoperative acute hypoxic respiratory failure with history of COPD: Tracheostomy has been capped and oxygenation appears to be satisfactory. CCM planning decannulation when he is stronger from a respiratory standpoint. 4.  Anemia of critical illness: Without evidence of overt blood loss, underwent PRBC transfusion with hemodialysis yesterday.  5.  History of ischemic cardiomyopathy with acute systolic heart failure  Subjective:   Feeling fair, complains of constipation.  Without acute events overnight   Objective:   BP 140/76   Pulse 99   Temp 98.4 F (36.9 C) (Oral)   Resp 14   Ht 5\' 9"  (1.753 m)   Wt 83 kg   SpO2 96%   BMI 27.02 kg/m   Intake/Output Summary (Last 24 hours) at 10/04/2019 0806 Last data filed at 10/04/2019 0401 Gross per 24 hour  Intake 1983.65 ml  Output 2000 ml  Net -16.35 ml   Weight change: -1.5 kg  Physical Exam: Gen: Comfortably resting in bed, being repositioned by nurse. CVS: Pulse regular tachycardia, S1 and S2 normal.  Left IJ TDC in situ Resp: Anteriorly clear to auscultation, no rales/rhonchi. Tracheostomy tube in  place.  Abd: Soft, flat, nontender Ext: Both legs in cushioned booties, trace edema.  Imaging: No results found.  Labs: BMET Recent Labs  Lab 09/28/19 0445 09/29/19 0247 09/30/19 0228 10/01/19 0510 10/02/19 0149 10/03/19 0303 10/04/19 0232  NA 134* 133* 134* 136 136 132* 136  K 4.3 4.5 4.9 3.9 4.2 4.2 4.1  CL 95* 93* 96* 96* 95* 91* 97*  CO2 23 24 22 23 24 22 23   GLUCOSE 147* 175* 192* 195* 186* 190* 111*  BUN 142* 91* 135* 83* 146* 174* 76*  CREATININE 6.08* 4.77* 6.76* 4.54* 6.20* 7.51* 4.26*  CALCIUM 8.9 9.0 9.0 9.1 8.9 8.9 9.0  PHOS 5.3* 4.8* 7.0* 6.0* 7.5* 8.7* 5.2*   CBC Recent Labs  Lab 09/29/19 0756 09/30/19 0702 10/02/19 0149 10/03/19 0759  WBC 19.9* 18.7* 17.9* 17.6*  HGB 8.2* 7.6* 7.5* 7.4*  HCT 26.3* 24.3* 24.1* 23.7*  MCV 98.9 100.4* 99.2 100.0  PLT 393 413* 430* 500*    Medications:    .  stroke: mapping our early stages of recovery book   Does not apply Once  . sodium chloride   Intravenous Once  . amiodarone  200 mg Per Tube Daily  . arformoterol  15 mcg Nebulization BID  . aspirin  81 mg Per Tube Daily  . atorvastatin  40 mg Per Tube Daily  . bisacodyl  10 mg Rectal Daily  . chlorhexidine  15 mL Mouth Rinse BID  . Chlorhexidine Gluconate Cloth  6 each Topical Q0600  . clonazePAM  0.5 mg Per Tube BID  .  darbepoetin (ARANESP) injection - DIALYSIS  100 mcg Intravenous Q Tue-HD  . feeding supplement (PRO-STAT SUGAR FREE 64)  60 mL Per Tube TID  . insulin aspart  0-15 Units Subcutaneous Q4H  . insulin detemir  12 Units Subcutaneous BID  . mouth rinse  15 mL Mouth Rinse q12n4p  . midodrine  5 mg Per Tube TID WC  . multivitamin  1 tablet Oral QHS  . nystatin  5 mL Oral QID  . pantoprazole sodium  40 mg Per Tube Daily  . revefenacin  175 mcg Nebulization Daily  . sertraline  25 mg Per Tube Daily  . sevelamer carbonate  1.6 g Per Tube TID WC  . trospium  20 mg Per Tube BID   Elmarie Shiley, MD 10/04/2019, 8:06 AM

## 2019-10-05 ENCOUNTER — Inpatient Hospital Stay (HOSPITAL_COMMUNITY): Payer: Medicare Other

## 2019-10-05 LAB — RENAL FUNCTION PANEL
Albumin: 2.6 g/dL — ABNORMAL LOW (ref 3.5–5.0)
Anion gap: 17 — ABNORMAL HIGH (ref 5–15)
BUN: 113 mg/dL — ABNORMAL HIGH (ref 8–23)
CO2: 22 mmol/L (ref 22–32)
Calcium: 9.4 mg/dL (ref 8.9–10.3)
Chloride: 96 mmol/L — ABNORMAL LOW (ref 98–111)
Creatinine, Ser: 6.01 mg/dL — ABNORMAL HIGH (ref 0.61–1.24)
GFR calc Af Amer: 10 mL/min — ABNORMAL LOW (ref >60)
GFR calc non Af Amer: 9 mL/min — ABNORMAL LOW (ref >60)
Glucose, Bld: 137 mg/dL — ABNORMAL HIGH (ref 70–99)
Phosphorus: 9 mg/dL — ABNORMAL HIGH (ref 2.5–4.6)
Potassium: 4.5 mmol/L (ref 3.5–5.1)
Sodium: 135 mmol/L (ref 135–145)

## 2019-10-05 LAB — MAGNESIUM: Magnesium: 2.6 mg/dL — ABNORMAL HIGH (ref 1.7–2.4)

## 2019-10-05 LAB — GLUCOSE, CAPILLARY
Glucose-Capillary: 120 mg/dL — ABNORMAL HIGH (ref 70–99)
Glucose-Capillary: 122 mg/dL — ABNORMAL HIGH (ref 70–99)
Glucose-Capillary: 178 mg/dL — ABNORMAL HIGH (ref 70–99)
Glucose-Capillary: 190 mg/dL — ABNORMAL HIGH (ref 70–99)

## 2019-10-05 MED ORDER — HEPARIN SODIUM (PORCINE) 1000 UNIT/ML IJ SOLN
INTRAMUSCULAR | Status: AC
  Administered 2019-10-05: 4200 [IU]
  Filled 2019-10-05: qty 5

## 2019-10-05 MED ORDER — NEPRO/CARBSTEADY PO LIQD: 237.0000 mL | Freq: Four times a day (QID) | ORAL | Status: DC

## 2019-10-05 MED ORDER — NEPRO/CARBSTEADY PO LIQD
1000.0000 mL | ORAL | Status: DC
  Administered 2019-10-05 – 2019-10-09 (×4): 1000 mL
  Filled 2019-10-05 (×6): qty 1000

## 2019-10-05 NOTE — Progress Notes (Signed)
Patient's trach was removed per CCM order without any complications. Gauze was applied with soft tape over stoma site. RN is aware.

## 2019-10-05 NOTE — Plan of Care (Signed)
  Problem: Education: Goal: Knowledge of General Education information will improve Description: Including pain rating scale, medication(s)/side effects and non-pharmacologic comfort measures Outcome: Progressing   Problem: Health Behavior/Discharge Planning: Goal: Ability to manage health-related needs will improve Outcome: Progressing   Problem: Clinical Measurements: Goal: Ability to maintain clinical measurements within normal limits will improve Outcome: Progressing Goal: Will remain free from infection Outcome: Progressing Goal: Diagnostic test results will improve Outcome: Progressing Goal: Respiratory complications will improve Outcome: Progressing Goal: Cardiovascular complication will be avoided Outcome: Progressing   Problem: Activity: Goal: Risk for activity intolerance will decrease Outcome: Progressing   Problem: Nutrition: Goal: Adequate nutrition will be maintained Outcome: Progressing   Problem: Coping: Goal: Level of anxiety will decrease Outcome: Progressing   Problem: Elimination: Goal: Will not experience complications related to bowel motility Outcome: Progressing Goal: Will not experience complications related to urinary retention Outcome: Progressing   Problem: Pain Managment: Goal: General experience of comfort will improve Outcome: Progressing   Problem: Safety: Goal: Ability to remain free from injury will improve Outcome: Progressing   Problem: Skin Integrity: Goal: Risk for impaired skin integrity will decrease Outcome: Progressing   Problem: Education: Goal: Will demonstrate proper wound care and an understanding of methods to prevent future damage Outcome: Progressing Goal: Knowledge of disease or condition will improve Outcome: Progressing Goal: Knowledge of the prescribed therapeutic regimen will improve Outcome: Progressing Goal: Individualized Educational Video(s) Outcome: Progressing   Problem: Activity: Goal: Risk for  activity intolerance will decrease Outcome: Progressing   Problem: Cardiac: Goal: Will achieve and/or maintain hemodynamic stability Outcome: Progressing   Problem: Clinical Measurements: Goal: Postoperative complications will be avoided or minimized Outcome: Progressing   Problem: Respiratory: Goal: Respiratory status will improve Outcome: Progressing   Problem: Skin Integrity: Goal: Wound healing without signs and symptoms of infection Outcome: Progressing Goal: Risk for impaired skin integrity will decrease Outcome: Progressing   Problem: Urinary Elimination: Goal: Ability to achieve and maintain adequate renal perfusion and functioning will improve Outcome: Progressing   

## 2019-10-05 NOTE — Progress Notes (Signed)
RT assessed patient's airway, no distress noted. Vitals are stable on room air. RT will continue to monitor patient.

## 2019-10-05 NOTE — Progress Notes (Signed)
[address]       [place] [address]             [phone]      21 Days Post-Op Procedure(s) (LRB): EVACUATION OF HEMATOMA (N/A) Subjective: Back from dialysis. May use G tube per IR Transfused yesterday C/O being "short winded"  Objective: Vital signs in last 24 hours: Temp:  [97.7 F (36.5 C)-98.3 F (36.8 C)] 97.7 F (36.5 C) (06/10 0724) Pulse Rate:  [96-112] 112 (06/10 1250) Cardiac Rhythm: Sinus tachycardia (06/10 1235) Resp:  [14-25] 23 (06/10 1250) BP: (94-142)/(58-88) 108/68 (06/10 1250) SpO2:  [96 %-100 %] 98 % (06/10 1250) FiO2 (%):  [21 %] 21 % (06/10 1250) Weight:  [83 kg] 83 kg (06/10 0724)  Hemodynamic parameters for last 24 hours:    Intake/Output from previous day: 06/09 0701 - 06/10 0700 In: -  Out: 375 [Urine:375] Intake/Output this shift: No intake/output data recorded.  General appearance: alert, cooperative and no distress Heart: regular rate and rhythm and tachy Lungs: dim in left base Abdomen: non tender or distended Extremities: no edema Wound: chest dressing in place, arm stable(radial site)  Lab Results: Recent Labs    10/03/19 0759  WBC 17.6*  HGB 7.4*  HCT 23.7*  PLT 500*   BMET:  Recent Labs    10/04/19 0232 10/05/19 0248  NA 136 135  K 4.1 4.5  CL 97* 96*  CO2 23 22  GLUCOSE 111* 137*  BUN 76* 113*  CREATININE 4.26* 6.01*  CALCIUM 9.0 9.4    PT/INR:  Recent Labs    10/04/19 0232  LABPROT 13.9  INR 1.1   ABG    Component Value Date/Time   PHART 7.481 (H) 09/07/2019 0029   HCO3 23.3 09/07/2019 0029   TCO2 24 09/07/2019 0029   ACIDBASEDEF 5.0 (H) 08/18/2019 2341   O2SAT 99.0 09/07/2019 0029   CBG (last 3)  Recent Labs    10/04/19 1936 10/04/19 2324 10/05/19 0413  GLUCAP 93 132* 122*    Meds Scheduled Meds: .  stroke: mapping our early stages of recovery book   Does not apply Once  . sodium chloride   Intravenous Once  . amiodarone  200 mg Per Tube Daily  .  arformoterol  15 mcg Nebulization BID  . aspirin  81 mg Per Tube Daily  . atorvastatin  40 mg Per Tube Daily  . bisacodyl  10 mg Rectal Daily  . chlorhexidine  15 mL Mouth Rinse BID  . Chlorhexidine Gluconate Cloth  6 each Topical Q0600  . clonazePAM  0.5 mg Per Tube BID  . darbepoetin (ARANESP) injection - DIALYSIS  100 mcg Intravenous Q Tue-HD  . feeding supplement (PRO-STAT SUGAR FREE 64)  60 mL Per Tube TID  . insulin aspart  0-15 Units Subcutaneous Q4H  . insulin detemir  12 Units Subcutaneous BID  . mouth rinse  15 mL Mouth Rinse q12n4p  . midodrine  5 mg Per Tube TID WC  . multivitamin  1 tablet Oral QHS  . nystatin  5 mL Oral QID  . pantoprazole sodium  40 mg Per Tube Daily  . revefenacin  175 mcg Nebulization Daily  . sertraline  25 mg Per Tube Daily  . sevelamer carbonate  1.6 g Per Tube TID WC  . trospium  20 mg Per Tube BID   Continuous Infusions: . sodium chloride Stopped (09/14/19 0802)  . feeding supplement (NEPRO CARB STEADY) Stopped (10/03/19 2354)  .  ferric gluconate (FERRLECIT/NULECIT) IV Stopped (10/03/19 1213)  . lactated ringers 20 mL/hr at 08/20/19 1200   PRN Meds:.acetaminophen (TYLENOL) oral liquid 160 mg/5 mL, albuterol, dextrose, Gerhardt's butt cream, guaiFENesin, hydrALAZINE, HYDROcodone-acetaminophen, hydrocortisone cream, HYDROmorphone (DILAUDID) injection, HYDROmorphone (DILAUDID) injection, ondansetron (ZOFRAN) IV, ondansetron (ZOFRAN) IV, oxyCODONE, phenol, Resource ThickenUp Clear, sennosides, sodium chloride flush  Xrays IR GASTROSTOMY TUBE MOD SED  Result Date: 10/04/2019 CLINICAL DATA:  Dysphagia, needs enteral feeding support EXAM: PERC PLACEMENT GASTROSTOMY FLUOROSCOPY TIME:  2 minutes 18 seconds; 22 mGy TECHNIQUE: The procedure, risks, benefits, and alternatives were explained to the patient. Questions regarding the procedure were encouraged and answered. The patient understands and consents to the procedure. As antibiotic prophylaxis, cefazolin  2 g was ordered pre-procedure and administered intravenously within one hour of incision. A safe approach avoiding the transverse colon was evident fluoroscopically due to residual contrast in the lumen of the decompressed colon. A 5 French angiographic catheter was placed as orogastric tube. The upper abdomen was prepped with Betadine, draped in usual sterile fashion, and infiltrated locally with 1% lidocaine. Intravenous Fentanyl 37mcg and Versed 1mg  were administered as conscious sedation during continuous monitoring of the patient's level of consciousness and physiological / cardiorespiratory status by the radiology RN, with a total moderate sedation time of 10 minutes. 0.5 mg glucagon given IV to facilitate gastric distention. Stomach was insufflated using air through the orogastric tube. An 31 French sheath needle was advanced percutaneously into the gastric lumen under fluoroscopy. Gas could be aspirated and a small contrast injection confirmed intraluminal spread. The sheath was exchanged over a guidewire for a 9 [place] vascular sheath, through which the snare device was advanced and used to snare a guidewire passed through the orogastric tube. This was withdrawn, and the snare attached to the 20 French pull-through gastrostomy tube, which was advanced antegrade, positioned with the internal bumper securing the anterior gastric wall to the anterior abdominal wall. Small contrast injection confirms appropriate positioning. The external bumper was applied and the catheter was flushed. COMPLICATIONS: COMPLICATIONS none IMPRESSION: 1. Technically successful 20 French pull-through gastrostomy placement under fluoroscopy. Electronically Signed   By: Lucrezia Europe M.D.   On: 10/04/2019 17:03    Assessment/Plan: S/P Procedure(s) (LRB): EVACUATION OF HEMATOMA (N/A)  1 conts with slow progress 2 SBP ranges from 94-142- cont to monitor, sinus tachy 3 sats good on RA,Trach is capped, hopefully can decannulate  soon 4 will resume previous diet and TF's - per recs by nutrition 5 get CXR to see if developing  left effusion 6 nephrology managing renal issues/HD 7 repeat cbc in am 8 ? DVT prophylaxis candidate at this point  LOS: 57 days    John Giovanni PA-C Pager [phone] 10/05/2019

## 2019-10-05 NOTE — Progress Notes (Signed)
Patient ID: Christian [name], male   DOB: 1946-01-19, 74 y.o.   MRN: [id number] [facility] Progress Note   Assessment/ Plan:   1. Acute kidney Injury-dialysis dependent for the last 5-1/2 weeks and to meet ESRD definition on 6/12: Anuric, multifactorial/multiphasic following coronary angiography, CABG with cardiogenic shock and cardiac arrest.  Earlier on CRRT from 4/29-5/12 and now on intermittent hemodialysis with no evidence of renal recovery so far.  With orders to undergo hemodialysis today via left IJ TDC to continue TTS schedule.  Now status post PEG tube placement. 2.  Coronary artery disease status post four-vessel CABG: Complicated by cardiac tamponade with repeat thoracotomy for evacuation of hematoma/blood in chest. 3.  Postoperative acute hypoxic respiratory failure with history of COPD: Tracheostomy has been capped and oxygenation appears to be satisfactory. CCM planning decannulation when he is stronger from a respiratory standpoint. 4.  Anemia of critical illness: Without evidence of overt blood loss, underwent PRBC transfusion with hemodialysis yesterday.  5.  History of ischemic cardiomyopathy with acute systolic heart failure  Subjective:   Reports some discomfort over PEG tube placement site overnight, denies chest pain or shortness of breath.   Objective:   BP (!) 141/88   Pulse 97   Temp 98 F (36.7 C) (Oral)   Resp 20   Ht 5\' 9"  (1.753 m)   Wt 83 kg   SpO2 98%   BMI 27.02 kg/m   Intake/Output Summary (Last 24 hours) at 10/05/2019 5573 Last data filed at 10/05/2019 0306 Gross per 24 hour  Intake --  Output 375 ml  Net -375 ml   Weight change:   Physical Exam: Gen: Appears comfortable resting in bed CVS: Pulse regular rhythm/normal rate, S1 and S2 normal.  Left IJ TDC in situ Resp: Anteriorly clear to auscultation, no rales/rhonchi. Tracheostomy tube in place.  Abd: Soft, flat, nontender Ext: Both legs in cushioned booties, trace dependent  edema.  Imaging: IR GASTROSTOMY TUBE MOD SED  Result Date: 10/04/2019 CLINICAL DATA:  Dysphagia, needs enteral feeding support EXAM: PERC PLACEMENT GASTROSTOMY FLUOROSCOPY TIME:  2 minutes 18 seconds; 22 mGy TECHNIQUE: The procedure, risks, benefits, and alternatives were explained to the patient. Questions regarding the procedure were encouraged and answered. The patient understands and consents to the procedure. As antibiotic prophylaxis, cefazolin 2 g was ordered pre-procedure and administered intravenously within one hour of incision. A safe approach avoiding the transverse colon was evident fluoroscopically due to residual contrast in the lumen of the decompressed colon. A 5 French angiographic catheter was placed as orogastric tube. The upper abdomen was prepped with Betadine, draped in usual sterile fashion, and infiltrated locally with 1% lidocaine. Intravenous Fentanyl 25mcg and Versed 1mg  were administered as conscious sedation during continuous monitoring of the patient's level of consciousness and physiological / cardiorespiratory status by the radiology RN, with a total moderate sedation time of 10 minutes. 0.5 mg glucagon given IV to facilitate gastric distention. Stomach was insufflated using air through the orogastric tube. An 38 French sheath needle was advanced percutaneously into the gastric lumen under fluoroscopy. Gas could be aspirated and a small contrast injection confirmed intraluminal spread. The sheath was exchanged over a guidewire for a 9 [place] vascular sheath, through which the snare device was advanced and used to snare a guidewire passed through the orogastric tube. This was withdrawn, and the snare attached to the 20 French pull-through gastrostomy tube, which was advanced antegrade, positioned with the internal bumper securing the anterior gastric wall to  the anterior abdominal wall. Small contrast injection confirms appropriate positioning. The external bumper was applied and  the catheter was flushed. COMPLICATIONS: COMPLICATIONS none IMPRESSION: 1. Technically successful 20 French pull-through gastrostomy placement under fluoroscopy. Electronically Signed   By: Lucrezia Europe M.D.   On: 10/04/2019 17:03    Labs: BMET Recent Labs  Lab 09/29/19 0247 09/30/19 0228 10/01/19 0510 10/02/19 0149 10/03/19 0303 10/04/19 0232 10/05/19 0248  NA 133* 134* 136 136 132* 136 135  K 4.5 4.9 3.9 4.2 4.2 4.1 4.5  CL 93* 96* 96* 95* 91* 97* 96*  CO2 24 22 23 24 22 23 22   GLUCOSE 175* 192* 195* 186* 190* 111* 137*  BUN 91* 135* 83* 146* 174* 76* 113*  CREATININE 4.77* 6.76* 4.54* 6.20* 7.51* 4.26* 6.01*  CALCIUM 9.0 9.0 9.1 8.9 8.9 9.0 9.4  PHOS 4.8* 7.0* 6.0* 7.5* 8.7* 5.2* 9.0*   CBC Recent Labs  Lab 09/29/19 0756 09/30/19 0702 10/02/19 0149 10/03/19 0759  WBC 19.9* 18.7* 17.9* 17.6*  HGB 8.2* 7.6* 7.5* 7.4*  HCT 26.3* 24.3* 24.1* 23.7*  MCV 98.9 100.4* 99.2 100.0  PLT 393 413* 430* 500*    Medications:    .  stroke: mapping our early stages of recovery book   Does not apply Once  . sodium chloride   Intravenous Once  . amiodarone  200 mg Per Tube Daily  . arformoterol  15 mcg Nebulization BID  . aspirin  81 mg Per Tube Daily  . atorvastatin  40 mg Per Tube Daily  . bisacodyl  10 mg Rectal Daily  . chlorhexidine  15 mL Mouth Rinse BID  . Chlorhexidine Gluconate Cloth  6 each Topical Q0600  . clonazePAM  0.5 mg Per Tube BID  . darbepoetin (ARANESP) injection - DIALYSIS  100 mcg Intravenous Q Tue-HD  . feeding supplement (PRO-STAT SUGAR FREE 64)  60 mL Per Tube TID  . insulin aspart  0-15 Units Subcutaneous Q4H  . insulin detemir  12 Units Subcutaneous BID  . mouth rinse  15 mL Mouth Rinse q12n4p  . midodrine  5 mg Per Tube TID WC  . multivitamin  1 tablet Oral QHS  . nystatin  5 mL Oral QID  . pantoprazole sodium  40 mg Per Tube Daily  . revefenacin  175 mcg Nebulization Daily  . sertraline  25 mg Per Tube Daily  . sevelamer carbonate  1.6 g Per  Tube TID WC  . trospium  20 mg Per Tube BID   Elmarie Shiley, MD 10/05/2019, 6:42 AM

## 2019-10-05 NOTE — Progress Notes (Addendum)
NAME:  Christian CARNS, MRN:  [id number], DOB:  [birth year], LOS: 56 ADMISSION DATE:  08/09/2019, CONSULTATION DATE:  08/28/2019 REFERRING MD:  Orvan Seen - TRH, CHIEF COMPLAINT:  Respiratory failure.    Brief History   74 year old man who presented with progressive dyspnea.  He was admitted 4/14 and was incidentally found to have a right lower lobe pulmonary embolism.  Overall the picture was that of decompensated heart failure with a non-STEMI.  Coronary angiography revealed triple-vessel disease and the patient was referred for cardiac surgery.  EF was found to be reduced at that time.He underwent four-vessel CABG 4/20 with good quality distal targets.  Cardiac function remained reduced post bypass.  Unable to close chest due to surrounding edema. Underwent attempted closure with VAC placement 4/22. Cardiac arrest 4/23 with repeated washouts with VAC placements on 4/26 and 4/29. Significant volume overload postoperatively.  Acute kidney injury.  Transitioned from CRRT for fluid removal to iHD on 5/13.  Events: 08/09/19 Admitted with NSTEM and ? Small RLL PE. Weight 213 pounds.  08/15/19 CABG x4 on 08/15/19 08/18/19 Cardiac arrest due to tamponade. Chest opened for washout 4/26 Chest washout in OR 4/29 CVVH started 5/5 OR for chest washout, tracheostomy 5/6 Trach aspirate with pan sensitive pseudomonas (completed course of abx  5/10 repeat washout, unable to close 5/12 to OR for chest closure with muscle flap  5/13 transitioned to [facility] 5/14 Neurology consulted for left sided weakness- CTH neg 5/20 chest wall swelling > taken to OR for evacuation of hematoma and re-closure.  5/24 down sized to 6 trach 5/28 down sized to 4 cuffless trach  6/01 Trach capped and tolerating well   Micro: 4/14 SARS/ Flu A/B >> neg 4/19 MRSA PCR >> neg 4/22 BCx 2 >> neg 5/2 trach asp >> normal flora  5/2 BCx2 >> neg 5/6 trach asp >> pan sensitive pseudomonas  5/6 BCx2 >> neg 5/10 sternal wound cx >> neg 5/14 BCx  2 >> ngtd  5/14 trach asp >> normal flora   ABX: Cefepime  4/23 >> 5/6 vanc 4/20 >> 4/25; 4/29 >>5/4; 5/6 >>5/13 Fluconazole 5/7 >>5/14 Meropenem 5/7 >> 5/15  Imaging: CTA PE 4/14 >> small RLL PE Echo 4/24: EF 45% with Grade 2 DD [facility] 5/13 and 5/15 >> chronic microvascular ischemia, no acute intracranial abnormality  CT chest 5/24: BILATERAL pleural effusions and scattered atelectasis greatest in LEFT lower lobe.Pleural calcifications in hemithoraces bilaterally question asbestos exposure.Stranding of the anterior mediastinum likely related to prior cardiac surgery and sternal dehiscence, with herniation of fat through the sternotomy.Diffuse stranding along the RIGHT pectoralis major and minor muscles which could be related to prior surgery, hemorrhage, or infection. 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  LDA: L [place] temporary HD line 5/7 >> 5/25 R midline PIV 5/6 >> out shiley trach 5/5> 5/28 #4 cuffless> cortrak left nare 5/3  >> Rectal tube >> 6/1 Wound vac over sternum >> 5/26 L IJ [facility] 5/25 >>  Interim history/subjective:  Remains capped since 6/1 On room air No respiratory events Strong cough   Objective   Blood pressure 108/68, pulse (!) 112, temperature 97.7 F (36.5 C), temperature source Oral, resp. rate (!) 23, height 5\' 9"  (1.753 m), weight 83 kg, SpO2 98 %.    FiO2 (%):  [21 %] 21 %   Intake/Output Summary (Last 24 hours) at 10/05/2019 1510 Last data filed at 10/05/2019 0306 Gross per 24 hour  Intake --  Output 375 ml  Net -375 ml  Filed Weights   10/03/19 1112 10/04/19 0500 10/05/19 0724  Weight: 86 kg 83 kg 83 kg    Examination:  General: chronically ill appearing male in NAD, sitting up in bed HEENT: MM pink/moist, #4 trach midline clean/dry, intact  Neuro: AAOx4, speech clear, MAE  CV: s1s2 rrr, no m/r/g PULM: non-labored on RA, lungs bilaterally clear  GI: soft, bsx4 active, PEG in place  Extremities: warm/dry, no edema  Skin: no rashes or lesions  Resolved    Cardiac tamponade on 4/23 -Status post open chest for washout and clot removal. Chest closure 5/12 with muscle flap per plastic surgery Postoperative atrial flutter status post cardioversion 4/22 Cardiogenic shock ATN  Assessment & Plan:   Tracheostomy status Post operative respiratory failure requiring prolonged mechanical ventilation, s/p tracheostomy 08/30/19- now with vent liberation, tracheostomy downsizing 5/28, trach capping x several days  Chronic hypoxemic respiratory failure COPD, Restrictive chest physiology s/p sternotomy  Mixed restrictive and obstructive defect, FEV1 50% pre-op  -proceed with decannulation, keep site covered until closed  -strict adherence to SLP recommendations / aspiration precautions  -pulmonary hygiene - mobilize, PT/OT efforts  -continue brovana, yupelri -follow intermittent CXR   Additional medical issues managed by primary : -ATN now HD dependent, dysphagia, mediastinal hematoma post washout, A-Fib, deconditioning  -Awaiting placement    Noe Gens, MSN, NP-C [facility] 10/05/2019, 3:15 PM   Please see [email or web address] for pager details.

## 2019-10-05 NOTE — Progress Notes (Signed)
SLP Cancellation Note  Patient Details Name: Christian [name] MRN: [id number] DOB: [birth year]   Cancelled treatment:       Reason Eval/Treat Not Completed: Patient at procedure or test/unavailable (Pt with PA at this time. SLP will follow up. )  Amadeus Oyama I. Hardin Negus, [place], [place] Office number [phone] Pager [place] 10/05/2019, 2:50 PM

## 2019-10-05 NOTE — Progress Notes (Signed)
PT Cancellation Note  Patient Details Name: EZEKIEL MENZER MRN: [id number] DOB: [birth year]   Cancelled Treatment:    Reason Eval/Treat Not Completed: Patient at procedure or test/unavailable (HD). Will follow-up for PT treatment as schedule permits.  Session performed under direction supervision of licensed clinician. Read, reviewed, edited and agree with student's findings and recommendations.   Mabeline Caras, PT, DPT Acute Rehabilitation Services  Pager [phone] Office [place] 10/05/2019, 12:13 PM

## 2019-10-05 NOTE — Progress Notes (Signed)
Referring Physician(s): Dr Rockwell Alexandria  Supervising Physician: Jacqulynn Cadet  Patient Status:  [facility] - In-pt  Chief Complaint:  Dysphagia   Subjective:  Percutaneous gastric tube placed 6/9 in IR   Allergies: Patient has no known allergies.  Medications: Prior to Admission medications   Medication Sig Start Date End Date Taking? Authorizing Provider  aspirin EC 81 MG tablet Take 81 mg by mouth daily.   Yes [provider]  atorvastatin (LIPITOR) 20 MG tablet TAKE 1 TABLET BY MOUTH EVERY DAY IN THE EVENING Patient taking differently: Take 20 mg by mouth every evening.  06/22/19  Yes Adrian Prows, MD  cloNIDine (CATAPRES) 0.2 MG tablet TAKE 1 TABLET (0.2 MG TOTAL) BY MOUTH DAILY. 06/22/19  Yes Adrian Prows, MD  cromolyn (OPTICROM) 4 % ophthalmic solution Place 1 drop into both eyes 4 (four) times daily as needed (dry eyes).  02/02/17  Yes [provider]  empagliflozin (JARDIANCE) 10 MG TABS tablet Take 10 mg by mouth daily before breakfast. 07/25/19  Yes Marrian Salvage, FNP  furosemide (LASIX) 20 MG tablet TAKE 1 TABLET BY MOUTH EVERY DAY Patient taking differently: Take 20 mg by mouth daily.  08/01/19  Yes Adrian Prows, MD  isosorbide mononitrate (IMDUR) 60 MG 24 hr tablet Take 60 mg by mouth daily.   Yes [provider]  metFORMIN (GLUCOPHAGE) 1000 MG tablet TAKE 1 TABLET TWICE A DAY WITH A MEAL Patient taking differently: Take 1,000 mg by mouth 2 (two) times daily with a meal.  07/31/19  Yes Hoyt Koch, MD  metoprolol succinate (TOPROL-XL) 100 MG 24 hr tablet Take 1 tablet (100 mg total) by mouth daily. Take with or immediately following a meal. 07/24/19  Yes Adrian Prows, MD  nitroGLYCERIN (NITROSTAT) 0.4 MG SL tablet Place 1 tablet (0.4 mg total) under the tongue every 5 (five) minutes as needed for chest pain. 06/01/19  Yes Adrian Prows, MD  olmesartan-hydrochlorothiazide (BENICAR HCT) 40-25 MG tablet TAKE 1 TABLET BY MOUTH EVERY DAY Patient  taking differently: Take 1 tablet by mouth daily.  07/03/19  Yes Adrian Prows, MD  PREVIDENT 5000 BOOSTER PLUS 1.1 % PSTE Take 1 application by mouth daily.  06/14/18  Yes [provider]  silodosin (RAPAFLO) 8 MG CAPS capsule Take 8 mg by mouth at bedtime. 07/27/19  Yes [provider]  spironolactone (ALDACTONE) 25 MG tablet Take 1 tablet (25 mg total) by mouth daily. 06/01/19 08/30/19 Yes Adrian Prows, MD  sucralfate (CARAFATE) 1 GM/10ML suspension Take 10 mLs (1 g total) by mouth 4 (four) times daily -  with meals and at bedtime. 07/24/19  Yes Marrian Salvage, FNP  tamsulosin [facility]) 0.4 MG CAPS capsule Take 0.4 mg by mouth daily after breakfast.  05/20/18  Yes [provider]  Trospium Chloride 60 MG CP24 Take 1 capsule by mouth daily. 07/31/19  Yes [provider]  verapamil (VERELAN PM) 240 MG 24 hr capsule TAKE 1 CAPSULE BY MOUTH EVERY DAY Patient taking differently: Take 240 mg by mouth daily.  07/24/19  Yes Adrian Prows, MD     Vital Signs: BP (!) 141/88   Pulse 97   Temp 98 F (36.7 C) (Oral)   Resp 20   Ht 5\' 9"  (1.753 m)   Wt 182 lb 15.7 oz (83 kg)   SpO2 98%   BMI 27.02 kg/m   Physical Exam Skin:    General: Skin is warm and dry.     Comments: Site is  clean and ry NT no bleeding +BS     Imaging: IR GASTROSTOMY TUBE MOD SED  Result Date: 10/04/2019 CLINICAL DATA:  Dysphagia, needs enteral feeding support EXAM: PERC PLACEMENT GASTROSTOMY FLUOROSCOPY TIME:  2 minutes 18 seconds; 22 mGy TECHNIQUE: The procedure, risks, benefits, and alternatives were explained to the patient. Questions regarding the procedure were encouraged and answered. The patient understands and consents to the procedure. As antibiotic prophylaxis, cefazolin 2 g was ordered pre-procedure and administered intravenously within one hour of incision. A safe approach avoiding the transverse colon was evident fluoroscopically due to residual contrast in the lumen of the decompressed  colon. A 5 French angiographic catheter was placed as orogastric tube. The upper abdomen was prepped with Betadine, draped in usual sterile fashion, and infiltrated locally with 1% lidocaine. Intravenous Fentanyl 36mcg and Versed 1mg  were administered as conscious sedation during continuous monitoring of the patient's level of consciousness and physiological / cardiorespiratory status by the radiology RN, with a total moderate sedation time of 10 minutes. 0.5 mg glucagon given IV to facilitate gastric distention. Stomach was insufflated using air through the orogastric tube. An 38 French sheath needle was advanced percutaneously into the gastric lumen under fluoroscopy. Gas could be aspirated and a small contrast injection confirmed intraluminal spread. The sheath was exchanged over a guidewire for a 9 [place] vascular sheath, through which the snare device was advanced and used to snare a guidewire passed through the orogastric tube. This was withdrawn, and the snare attached to the 20 French pull-through gastrostomy tube, which was advanced antegrade, positioned with the internal bumper securing the anterior gastric wall to the anterior abdominal wall. Small contrast injection confirms appropriate positioning. The external bumper was applied and the catheter was flushed. COMPLICATIONS: COMPLICATIONS none IMPRESSION: 1. Technically successful 20 French pull-through gastrostomy placement under fluoroscopy. Electronically Signed   By: Lucrezia Europe M.D.   On: 10/04/2019 17:03    Labs:  CBC: Recent Labs    09/29/19 0756 09/30/19 0702 10/02/19 0149 10/03/19 0759  WBC 19.9* 18.7* 17.9* 17.6*  HGB 8.2* 7.6* 7.5* 7.4*  HCT 26.3* 24.3* 24.1* 23.7*  PLT 393 413* 430* 500*    COAGS: Recent Labs    08/19/19 0300 08/19/19 1820 09/04/19 0358 09/09/19 9211 09/10/19 0453 09/11/19 0208 09/12/19 0510 09/14/19 0805 10/04/19 0232  INR 1.5* 1.3*  --   --   --   --   --  1.2 1.1  APTT  --   --    < > 36 37*  42* 39*  --   --    < > = values in this interval not displayed.    BMP: Recent Labs    10/02/19 0149 10/03/19 0303 10/04/19 0232 10/05/19 0248  NA 136 132* 136 135  K 4.2 4.2 4.1 4.5  CL 95* 91* 97* 96*  CO2 24 22 23 22   GLUCOSE 186* 190* 111* 137*  BUN 146* 174* 76* 113*  CALCIUM 8.9 8.9 9.0 9.4  CREATININE 6.20* 7.51* 4.26* 6.01*  GFRNONAA 8* 7* 13* 9*  GFRAA 10* 8* 15* 10*    LIVER FUNCTION TESTS: Recent Labs    08/23/19 0552 08/23/19 0552 08/24/19 0405 08/25/19 0326 09/02/19 0311 09/02/19 1530 09/07/19 0223 09/07/19 0707 10/02/19 0149 10/03/19 0303 10/04/19 0232 10/05/19 0248  BILITOT 0.8  --  0.7  --  1.0  --  1.1  --   --   --   --   --   AST 50*  --  59*  --  38  --  72*  --   --   --   --   --   ALT 220*  --  169*  --  49*  --  127*  --   --   --   --   --   ALKPHOS 82  --  115  --  104  --  193*  --   --   --   --   --   PROT 4.2*  --  4.3*  --  5.1*  --  5.5*  --   --   --   --   --   ALBUMIN 1.5*   < > 1.6*   < > 2.1*   < > 2.2*   < > 2.4* 2.4* 2.6* 2.6*   < > = values in this interval not displayed.    Assessment and Plan:  May use G tube now  Electronically Signed: Lavonia Drafts, PA-C 10/05/2019, 8:25 AM   I spent a total of 15 Minutes at the the patient's bedside AND on the patient's hospital floor or unit, greater than 50% of which was counseling/coordinating care for Perc G tube placement

## 2019-10-06 DIAGNOSIS — Z930 Tracheostomy status: Secondary | ICD-10-CM

## 2019-10-06 LAB — GLUCOSE, CAPILLARY
Glucose-Capillary: 186 mg/dL — ABNORMAL HIGH (ref 70–99)
Glucose-Capillary: 176 mg/dL — ABNORMAL HIGH (ref 70–99)
Glucose-Capillary: 146 mg/dL — ABNORMAL HIGH (ref 70–99)
Glucose-Capillary: 138 mg/dL — ABNORMAL HIGH (ref 70–99)
Glucose-Capillary: 185 mg/dL — ABNORMAL HIGH (ref 70–99)
Glucose-Capillary: 209 mg/dL — ABNORMAL HIGH (ref 70–99)

## 2019-10-06 LAB — CBC
HCT: 35.4 % — ABNORMAL LOW (ref 39.0–52.0)
Hemoglobin: 11.1 g/dL — ABNORMAL LOW (ref 13.0–17.0)
MCH: 31.3 pg (ref 26.0–34.0)
MCHC: 31.4 g/dL (ref 30.0–36.0)
MCV: 99.7 fL (ref 80.0–100.0)
Platelets: 479 10*3/uL — ABNORMAL HIGH (ref 150–400)
RBC: 3.55 MIL/uL — ABNORMAL LOW (ref 4.22–5.81)
RDW: 18.9 % — ABNORMAL HIGH (ref 11.5–15.5)
WBC: 17.1 10*3/uL — ABNORMAL HIGH (ref 4.0–10.5)
nRBC: 0.5 % — ABNORMAL HIGH (ref 0.0–0.2)

## 2019-10-06 LAB — RENAL FUNCTION PANEL
Albumin: 2.7 g/dL — ABNORMAL LOW (ref 3.5–5.0)
Anion gap: 13 (ref 5–15)
BUN: 62 mg/dL — ABNORMAL HIGH (ref 8–23)
CO2: 24 mmol/L (ref 22–32)
Calcium: 9.4 mg/dL (ref 8.9–10.3)
Chloride: 96 mmol/L — ABNORMAL LOW (ref 98–111)
Creatinine, Ser: 4.05 mg/dL — ABNORMAL HIGH (ref 0.61–1.24)
GFR calc Af Amer: 16 mL/min — ABNORMAL LOW (ref >60)
GFR calc non Af Amer: 14 mL/min — ABNORMAL LOW (ref >60)
Glucose, Bld: 170 mg/dL — ABNORMAL HIGH (ref 70–99)
Phosphorus: 6 mg/dL — ABNORMAL HIGH (ref 2.5–4.6)
Potassium: 3.5 mmol/L (ref 3.5–5.1)
Sodium: 133 mmol/L — ABNORMAL LOW (ref 135–145)

## 2019-10-06 LAB — MAGNESIUM: Magnesium: 2.4 mg/dL (ref 1.7–2.4)

## 2019-10-06 MED ORDER — CHLORHEXIDINE GLUCONATE CLOTH 2 % EX PADS
6.0000 | MEDICATED_PAD | Freq: Every day | CUTANEOUS | Status: DC
  Administered 2019-10-06 – 2019-10-18 (×10): 6 via TOPICAL

## 2019-10-06 MED ORDER — RENA-VITE PO TABS
1.0000 | ORAL_TABLET | Freq: Every day | ORAL | Status: DC
  Administered 2019-10-06 – 2019-10-13 (×7): 1
  Filled 2019-10-06 (×8): qty 1

## 2019-10-06 NOTE — Progress Notes (Signed)
Occupational Therapy Treatment Patient Details Name: Christian [name] MRN: [id number] DOB: 05-Apr-1946 Today's Date: 10/06/2019    History of present illness Pt is a 74 y.o. admitted 08/10/19 with STEMI, RLL PE. S/p CABGx4 on 4/20, remained open with wound VAC with repeated washouts. 4/23 cardiac arrest, 4/29 initiated CRRT, vent s/p trach. 5/10 additional sternal washout unable to close. 5/12 sternal plating with pectoralis flap and omental harvest to mediastinum; off CRRT with iHD started. Chest wound closed. Neurology consulted for acute L-sided weakness- head CT 5/14 negative for acute injury. 5/20 pt with emergent hematoma evacuation and transition to trach collar; 5/24 downsized to 6 trach; 5/28 downsized to cuffless trach. PMHx: BPH, CAD, DM, HLD, HTN   OT comments  Patient continues to make steady progress towards goals in skilled OT session. Patient's session encompassed utilization of Kreg tilt bed in chair position to complete bed level exercises in addition to basic ADLs. Pt with increased motivation to complete bed level exercises (see notes below) however fatigues quickly. Discharge remains appropriate at this time; will continue to follow acutely.    Follow Up Recommendations  LTACH;SNF;Supervision/Assistance - 24 hour    Equipment Recommendations  Other (comment) (TBD)    Recommendations for Other Services      Precautions / Restrictions Precautions Precautions: Fall;Sternal Restrictions Other Position/Activity Restrictions: sternal precautions       Mobility Bed Mobility                  Transfers                      Balance Overall balance assessment: Needs assistance Sitting-balance support: Bilateral upper extremity supported;No upper extremity supported;Feet supported Sitting balance-Leahy Scale: Poor Sitting balance - Comments: Once elevated in Kreg bed, pt able to complete 5 repetitions of trunk activation (able to hold with min A of 1 for  10-15 seconds)                                   ADL either performed or assessed with clinical judgement   ADL       Grooming: Wash/dry hands;Wash/dry face;Set up;Bed level                               Functional mobility during ADLs: Maximal assistance;+2 for physical assistance;+2 for safety/equipment General ADL Comments: Session focus on bed level exercises with kreg tilt bed to increase strength     Vision       Perception     Praxis      Cognition Arousal/Alertness: Awake/alert Behavior During Therapy: WFL for tasks assessed/performed Overall Cognitive Status: Within Functional Limits for tasks assessed                                          Exercises Low Level/ICU Exercises Ankle Circles/Pumps: AROM;Both;10 reps;Supine Heel Slides: AROM;Both;10 reps;Supine Stabilized Bridging: AROM;10 reps;Supine Other Exercises Other Exercises: 5 repetitions of bringing back off of Kreg bed in chair position, holding for 10-15 seconds with min A from therapist, max a in order to sit upright Other Exercises: 3 repetitions of mini squats to push up further into bed from supine (pts feet on foot board when taken out of chair position)   Shoulder  Instructions       General Comments      Pertinent Vitals/ Pain       Pain Assessment: Faces Faces Pain Scale: Hurts a little bit Pain Location: generalized with movement Pain Descriptors / Indicators: Grimacing Pain Intervention(s): Limited activity within patient's tolerance;Monitored during session;Repositioned  Home Living                                          Prior Functioning/Environment              Frequency  Min 2X/week        Progress Toward Goals  OT Goals(current goals can now be found in the care plan section)  Progress towards OT goals: Progressing toward goals  Acute Rehab OT Goals Patient Stated Goal: return to independence OT  Goal Formulation: With patient Time For Goal Achievement: 10/02/19 Potential to Achieve Goals: [place] Discharge plan remains appropriate    Co-evaluation                 AM-PAC OT "6 Clicks" Daily Activity     Outcome Measure   Help from another person eating meals?: Total Help from another person taking care of personal grooming?: A Lot Help from another person toileting, which includes using toliet, bedpan, or urinal?: Total Help from another person bathing (including washing, rinsing, drying)?: A Lot Help from another person to put on and taking off regular upper body clothing?: A Lot Help from another person to put on and taking off regular lower body clothing?: Total 6 Click Score: 9    End of Session Equipment Utilized During Treatment: Other (comment) (Kreg tilt bed)  OT Visit Diagnosis: Unsteadiness on feet (R26.81);Muscle weakness (generalized) (M62.81);Pain Pain - part of body:  (Generalized)   Activity Tolerance Patient tolerated treatment well   Patient Left in bed;with call bell/phone within reach;with nursing/sitter in room   Nurse Communication Mobility status        Time: 7014-1030 OT Time Calculation (min): 35 min  Charges: OT General Charges $OT Visit: 1 Visit OT Treatments $Self Care/Home Management : 8-22 mins $Therapeutic Activity: 8-22 mins  Corinne Ports E. Hargis Vandyne, COTA/L Acute Rehabilitation Services [place] 10/06/2019, 2:47 PM

## 2019-10-06 NOTE — Plan of Care (Signed)
  Problem: Education: Goal: Knowledge of General Education information will improve Description: Including pain rating scale, medication(s)/side effects and non-pharmacologic comfort measures Outcome: Progressing   Problem: Clinical Measurements: Goal: Ability to maintain clinical measurements within normal limits will improve Outcome: Progressing Goal: Respiratory complications will improve Outcome: Progressing Goal: Cardiovascular complication will be avoided Outcome: Progressing   Problem: Activity: Goal: Risk for activity intolerance will decrease Outcome: Progressing   Problem: Nutrition: Goal: Adequate nutrition will be maintained Outcome: Progressing   Problem: Elimination: Goal: Will not experience complications related to urinary retention Outcome: Progressing   Problem: Pain Managment: Goal: General experience of comfort will improve Outcome: Progressing   Problem: Skin Integrity: Goal: Risk for impaired skin integrity will decrease Outcome: Progressing

## 2019-10-06 NOTE — Progress Notes (Signed)
Physical Therapy Treatment Patient Details Name: Christian [name] MRN: [id number] DOB: 01-21-1946 Today's Date: 10/06/2019    History of Present Illness Pt is a 74 y.o. admitted 08/10/19 with STEMI, RLL PE. S/p CABGx4 on 4/20, remained open with wound VAC with repeated washouts. 4/23 cardiac arrest, 4/29 initiated CRRT, vent s/p trach. 5/10 additional sternal washout unable to close. 5/12 sternal plating with pectoralis flap and omental harvest to mediastinum; off CRRT with iHD started. Chest wound closed. Neurology consulted for acute L-sided weakness- head CT 5/14 negative for acute injury. 5/20 pt with emergent hematoma evacuation and transition to trach collar; 5/24 downsized to 6 trach; 5/28 downsized to cuffless trach. PEG tube placed 6/9. Trach decannulated 6/10. PMH includes BPH, CAD, DM, HLD, HTN   PT Comments    Pt motivated to participate this session, talkative and joking appropriately throughout. Tolerated prolonged standing tilt bed to 47 degrees with stable vital signs, as well as BLE/core therex. Improving ability to mobilize in bed, modA for rolling. Quick to fatigue requiring multiple rest breaks. Continued education regarding sternal precautions. Pt demonstrates good insight into PEG tube and ostomy, reminding PT to not place tilt bed straps over top of this. Continue to recommend SNF-level therapies to maximize functional mobility and independence.   Follow Up Recommendations  SNF;Supervision/Assistance - 24 hour     Equipment Recommendations   (defer)    Recommendations for Other Services       Precautions / Restrictions Precautions Precautions: Fall;Sternal;Other (comment) Precaution Comments: Ostomy, PEG Restrictions Other Position/Activity Restrictions: sternal precautions    Mobility  Bed Mobility Overal bed mobility: Needs Assistance Bed Mobility: Rolling Rolling: Mod assist         General bed mobility comments: ModA to roll R/L for pad placement, pt  initiating movement well with LEs, cues to utilize BUEs within sternal precautions  Transfers Overall transfer level: Needs assistance               General transfer comment: Partial standing up to 30-47 degrees in tilt bed, for total time >25 min. Pt tolerated very well, VSS  Ambulation/Gait                 Stairs             Wheelchair Mobility    Modified Rankin (Stroke Patients Only)       Balance Overall balance assessment: Needs assistance Sitting-balance support: Bilateral upper extremity supported;No upper extremity supported;Feet supported Sitting balance-Leahy Scale: Poor Sitting balance - Comments: Continues to have L lateral lean requiring UE support and assist to correct and maintain midline posture                                    Cognition Arousal/Alertness: Awake/alert Behavior During Therapy: WFL for tasks assessed/performed Overall Cognitive Status: Within Functional Limits for tasks assessed                                 General Comments: Much more talkative and joking appropriately throughout session, following commands appropriately. Cognition not formally assessed      Exercises Low Level/ICU Exercises Ankle Circles/Pumps: AROM;Both;10 reps;Supine Heel Slides: AROM;Both;10 reps;Supine Stabilized Bridging: AROM;10 reps;Supine Other Exercises Other Exercises: 5 repetitions of bringing back off of Kreg bed in chair position, holding for 10-15 seconds with min A from therapist, max a in  order to sit upright Other Exercises: 3 repetitions of mini squats to push up further into bed from supine (pts feet on foot board when taken out of chair position) Other Exercises: Bilateral heel cord stretch (pt reports "that feels good"), also tolerating this stretch well while tilted while accepting 37 kg of body weight through foot board; TKE while tilted    General Comments General comments (skin integrity, edema,  etc.): Kreg bed representative Hart Carwin) present during tilt session      Pertinent Vitals/Pain Pain Assessment: Faces Faces Pain Scale: Hurts a little bit Pain Location: generalized with movement Pain Descriptors / Indicators: Grimacing Pain Intervention(s): Monitored during session;Repositioned    Home Living                      Prior Function            PT Goals (current goals can now be found in the care plan section) Acute Rehab PT Goals Patient Stated Goal: return to independence Progress towards PT goals: Progressing toward goals    Frequency    Min 2X/week      PT Plan Current plan remains appropriate    Co-evaluation              AM-PAC PT "6 Clicks" Mobility   Outcome Measure  Help needed turning from your back to your side while in a flat bed without using bedrails?: A Lot Help needed moving from lying on your back to sitting on the side of a flat bed without using bedrails?: A Lot Help needed moving to and from a bed to a chair (including a wheelchair)?: Total Help needed standing up from a chair using your arms (e.g., wheelchair or bedside chair)?: Total Help needed to walk in hospital room?: Total Help needed climbing 3-5 steps with a railing? : Total 6 Click Score: 8    End of Session   Activity Tolerance: Patient tolerated treatment well Patient left: in bed;with call bell/phone within reach;with nursing/sitter in room Nurse Communication: Mobility status PT Visit Diagnosis: Other abnormalities of gait and mobility (R26.89);Muscle weakness (generalized) (M62.81);Other symptoms and signs involving the nervous system (R29.898)     Time: 4944-9675 PT Time Calculation (min) (ACUTE ONLY): 58 min  Charges:  $Therapeutic Exercise: 23-37 mins $Therapeutic Activity: 8-22 mins                    Mabeline Caras, PT, DPT Acute Rehabilitation Services  Pager [phone] Office [place] 10/06/2019, 5:13 PM

## 2019-10-06 NOTE — Progress Notes (Addendum)
NAME:  Christian [name], MRN:  [id number], DOB:  06-Dec-1945, LOS: 24 ADMISSION DATE:  08/09/2019, CONSULTATION DATE:  08/28/2019 REFERRING MD:  Orvan Seen - TRH, CHIEF COMPLAINT:  Respiratory failure.    Brief History   74 year old man who presented with progressive dyspnea.  He was admitted 4/14 and was incidentally found to have a right lower lobe pulmonary embolism.  Overall the picture was that of decompensated heart failure with a non-STEMI.  Coronary angiography revealed triple-vessel disease and the patient was referred for cardiac surgery.  EF was found to be reduced at that time.He underwent four-vessel CABG 4/20 with good quality distal targets.  Cardiac function remained reduced post bypass.  Unable to close chest due to surrounding edema. Underwent attempted closure with VAC placement 4/22. Cardiac arrest 4/23 with repeated washouts with VAC placements on 4/26 and 4/29. Significant volume overload postoperatively.  Acute kidney injury.  Transitioned from CRRT for fluid removal to iHD on 5/13.  Events: 08/09/19 Admitted with NSTEM and ? Small RLL PE. Weight 213 pounds.  08/15/19 CABG x4 on 08/15/19 08/18/19 Cardiac arrest due to tamponade. Chest opened for washout 4/26 Chest washout in OR 4/29 CVVH started 5/5 OR for chest washout, tracheostomy 5/6 Trach aspirate with pan sensitive pseudomonas (completed course of abx  5/10 repeat washout, unable to close 5/12 to OR for chest closure with muscle flap  5/13 transitioned to [facility] 5/14 Neurology consulted for left sided weakness- CTH neg 5/20 chest wall swelling > taken to OR for evacuation of hematoma and re-closure.  5/24 down sized to 6 trach 5/28 down sized to 4 cuffless trach  6/01 Trach capped and tolerating well   Micro: 4/14 SARS/ Flu A/B >> neg 4/19 MRSA PCR >> neg 4/22 BCx 2 >> neg 5/2 trach asp >> normal flora  5/2 BCx2 >> neg 5/6 trach asp >> pan sensitive pseudomonas  5/6 BCx2 >> neg 5/10 sternal wound cx >> neg 5/14 BCx  2 >> ngtd  5/14 trach asp >> normal flora   ABX: Cefepime  4/23 >> 5/6 vanc 4/20 >> 4/25; 4/29 >>5/4; 5/6 >>5/13 Fluconazole 5/7 >>5/14 Meropenem 5/7 >> 5/15  Imaging: CTA PE 4/14 >> small RLL PE Echo 4/24: EF 45% with Grade 2 DD [facility] 5/13 and 5/15 >> chronic microvascular ischemia, no acute intracranial abnormality  CT chest 5/24: BILATERAL pleural effusions and scattered atelectasis greatest in LEFT lower lobe.Pleural calcifications in hemithoraces bilaterally question asbestos exposure.Stranding of the anterior mediastinum likely related to prior cardiac surgery and sternal dehiscence, with herniation of fat through the sternotomy.Diffuse stranding along the RIGHT pectoralis major and minor muscles which could be related to prior surgery, hemorrhage, or infection. 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  LDA: L  temporary HD line 5/7 >> 5/25 R midline PIV 5/6 >> out shiley trach 5/5> 5/28 #4 cuffless> cortrak left nare 5/3  >> Rectal tube >> 6/1 Wound vac over sternum >> 5/26 L IJ [facility] 5/25 >>  Interim history/subjective:  No acute events overnight.  Trach decannulated yesterday.  No complaints this morning.  Breathing well. Phonating well. O2 sats 95% on room air.   Objective   Blood pressure 122/77, pulse (!) 101, temperature 97.8 F (36.6 C), temperature source Oral, resp. rate 15, height 5\' 9"  (1.753 m), weight 81.1 kg, SpO2 99 %.    FiO2 (%):  [21 %] 21 %   Intake/Output Summary (Last 24 hours) at 10/06/2019 0856 Last data filed at 10/06/2019 0300 Gross per 24 hour  Intake --  Output 1063 ml  Net -1063 ml   Filed Weights   10/04/19 0500 10/05/19 0724 10/06/19 0420  Weight: 83 kg 83 kg 81.1 kg    Examination:  General: Chronically ill appearing elderly male HEENT: [place]/AT, PERRL, no JVD Neuro: Alert, oriented, non-focal CV: s1s2 rrr, no m/r/g PULM: Unlabored on room air. Clear breath sounds. GI: soft, bsx4 active, PEG in place  Extremities: warm/dry, no edema  Skin: no  rashes or lesions  Resolved   Cardiac tamponade on 4/23 Status post open chest for washout and clot removal. 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 Appropriate aspiration precautions reinforced  - pulmonary hygiene - mobilize, PT/OT efforts  - continue brovana, yupelri  Additional medical issues managed by primary : -ATN now HD dependent, dysphagia, mediastinal hematoma post washout, A-Fib, deconditioning  -Awaiting placement  PCCM will be available PRN.   Georgann Housekeeper, AGACNP-BC [place]  See Amion for personal pager PCCM on call pager [phone]  10/06/2019 8:56 AM

## 2019-10-06 NOTE — Progress Notes (Signed)
[facility] PROGRESS NOTE  Assessment/ Plan: Pt is a 74 y.o. yo male is status post CABG and had a cardiac arrest due to tamponade, developed AKI requiring dialysis.  Previously the creatinine level was 1.22 in 06/2019.  #Acute kidney injury following cardiac cath, CABG, cardiogenic shock and cardiac arrest.  Started CRRT from 4/29-5/12 to manage volume.  He is now tolerating intermittent hemodialysis.   Status post right IJ TDC by IR on 5/25. No permanent access because of AKI. Status post HD yesterday with around 800 cc UF, tolerated well.  Plan for next HD tomorrow. Arrangement for outpatient HD ongoing.  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  # Postoperative acute hypoxic respiratory failure on h/o COPD-extubated and now the tracheostomy tube is capped.  # Anemia of critical illness: Iron saturation 14% therefore received IV iron with dialysis.  Continue ESA.  Transfusion as needed.  #CKD-MBD: Continue Renvela dose.  Monitor phosphorus level.  # A fib per primary.  #Protein calorie malnutrition currently status post feeding tube placement.  Per primary team.  Subjective: Seen and examined at bedside.  Had dialysis yesterday tolerated well.  No new event.  Plan for next HD tomorrow. Objective Vital signs in last 24 hours: Vitals:   10/05/19 2047 10/05/19 2318 10/06/19 0420 10/06/19 0743  BP:  118/75 122/77   Pulse:  (!) 108 (!) 105   Resp:  15 20   Temp:  98.3 F (36.8 C) 98.8 F (37.1 C) 97.8 F (36.6 C)  TempSrc:  Oral Oral Oral  SpO2: 99% 99% 98%   Weight:   81.1 kg   Height:       Weight change:   Intake/Output Summary (Last 24 hours) at 10/06/2019 0815 Last data filed at 10/06/2019 0300 Gross per 24 hour  Intake  --  Output 1063 ml  Net -1063 ml       Labs: Basic Metabolic Panel: Recent Labs  Lab 10/04/19 0232 10/05/19 0248 10/06/19 0315  NA 136 135 133*  K 4.1 4.5 3.5  CL 97* 96* 96*  CO2 23 22 24   GLUCOSE 111* 137* 170*  BUN 76* 113* 62*  CREATININE 4.26* 6.01* 4.05*  CALCIUM 9.0 9.4 9.4  PHOS 5.2* 9.0* 6.0*   Liver Function Tests: Recent Labs  Lab 10/04/19 0232 10/05/19 0248 10/06/19 0315  ALBUMIN 2.6* 2.6* 2.7*   No results for input(s): LIPASE, AMYLASE in the last 168 hours. No results for input(s): AMMONIA in the last 168 hours. CBC: Recent Labs  Lab 09/30/19 0702 09/30/19 0702 10/02/19 0149 10/03/19 0759 10/06/19 0316  WBC 18.7*   < > 17.9* 17.6* 17.1*  HGB 7.6*   < > 7.5* 7.4* 11.1*  HCT 24.3*   < > 24.1* 23.7* 35.4*  MCV 100.4*  --  99.2 100.0 99.7  PLT 413*   < > 430* 500* 479*   < > = values in this interval not displayed.   Cardiac Enzymes: No results for input(s): CKTOTAL, CKMB, CKMBINDEX, TROPONINI in the last 168 hours. CBG: Recent Labs  Lab 10/05/19 1621 10/05/19 1952 10/05/19 2334 10/06/19 0417 10/06/19 0719  GLUCAP 120* 178* 190* 186* 176*    Iron Studies:  No results for input(s): IRON, TIBC, TRANSFERRIN, FERRITIN in the last 72 hours. Studies/Results: DG  Chest 2 View  Result Date: 10/05/2019 CLINICAL DATA:  Postoperative follow-up. EXAM: CHEST - 2 VIEW COMPARISON:  Sep 23, 2019 FINDINGS: Multiple sternal fixation plates and screws are seen. There is stable tracheostomy tube and left-sided venous catheter positioning. The nasogastric tube seen on the prior study has been removed. There is no evidence of acute infiltrate or pneumothorax. A small left pleural effusion is seen. The heart size and mediastinal contours are within normal limits. The visualized skeletal structures are unremarkable. IMPRESSION: Postoperative changes, as described above, with a small left pleural effusion. Electronically Signed   By: Virgina Norfolk M.D.   On:  10/05/2019 17:37   IR GASTROSTOMY TUBE MOD SED  Result Date: 10/04/2019 CLINICAL DATA:  Dysphagia, needs enteral feeding support EXAM: PERC PLACEMENT GASTROSTOMY FLUOROSCOPY TIME:  2 minutes 18 seconds; 22 mGy TECHNIQUE: The procedure, risks, benefits, and alternatives were explained to the patient. Questions regarding the procedure were encouraged and answered. The patient understands and consents to the procedure. As antibiotic prophylaxis, cefazolin 2 g was ordered pre-procedure and administered intravenously within one hour of incision. A safe approach avoiding the transverse colon was evident fluoroscopically due to residual contrast in the lumen of the decompressed colon. A 5 French angiographic catheter was placed as orogastric tube. The upper abdomen was prepped with Betadine, draped in usual sterile fashion, and infiltrated locally with 1% lidocaine. Intravenous Fentanyl 88mcg and Versed 1mg  were administered as conscious sedation during continuous monitoring of the patient's level of consciousness and physiological / cardiorespiratory status by the radiology RN, with a total moderate sedation time of 10 minutes. 0.5 mg glucagon given IV to facilitate gastric distention. Stomach was insufflated using air through the orogastric tube. An 89 French sheath needle was advanced percutaneously into the gastric lumen under fluoroscopy. Gas could be aspirated and a small contrast injection confirmed intraluminal spread. The sheath was exchanged over a guidewire for a 9 [place] vascular sheath, through which the snare device was advanced and used to snare a guidewire passed through the orogastric tube. This was withdrawn, and the snare attached to the 20 French pull-through gastrostomy tube, which was advanced antegrade, positioned with the internal bumper securing the anterior gastric wall to the anterior abdominal wall. Small contrast injection confirms appropriate positioning. The external bumper was applied and  the catheter was flushed. COMPLICATIONS: COMPLICATIONS none IMPRESSION: 1. Technically successful 20 French pull-through gastrostomy placement under fluoroscopy. Electronically Signed   By: Lucrezia Europe M.D.   On: 10/04/2019 17:03    Medications: Infusions: . sodium chloride Stopped (09/14/19 0802)  . feeding supplement (NEPRO CARB STEADY) 1,000 mL (10/05/19 1828)  . ferric gluconate (FERRLECIT/NULECIT) IV Stopped (10/03/19 1213)  . lactated ringers 20 mL/hr at 08/20/19 1200    Scheduled Medications: .  stroke: mapping our early stages of recovery book   Does not apply Once  . sodium chloride   Intravenous Once  . amiodarone  200 mg Per Tube Daily  . arformoterol  15 mcg Nebulization BID  . aspirin  81 mg Per Tube Daily  . atorvastatin  40 mg Per Tube Daily  . bisacodyl  10 mg Rectal Daily  . chlorhexidine  15 mL Mouth Rinse BID  . Chlorhexidine Gluconate Cloth  6 each Topical Q0600  . clonazePAM  0.5 mg Per Tube BID  . darbepoetin (ARANESP) injection - DIALYSIS  100 mcg Intravenous Q Tue-HD  . feeding supplement (PRO-STAT SUGAR FREE 64)  60 mL Per Tube TID  . insulin aspart  0-15 Units Subcutaneous Q4H  . insulin detemir  12 Units Subcutaneous BID  . mouth rinse  15 mL Mouth Rinse q12n4p  . midodrine  5 mg Per Tube TID WC  . multivitamin  1 tablet Oral QHS  . nystatin  5 mL Oral QID  . pantoprazole sodium  40 mg Per Tube Daily  . revefenacin  175 mcg Nebulization Daily  . sertraline  25 mg Per Tube Daily  . sevelamer carbonate  1.6 g Per Tube TID WC  . trospium  20 mg Per Tube BID    have reviewed scheduled and prn medications.  Physical Exam: General:NAD, lying on bed comfortable Heart:RRR, s1s2 nl Lungs: Clear bilateral, no wheezing Abdomen:soft, Non-tender, non-distended Extremities:No edema Dialysis Access: Right IJ TDC, clean site with no evidence of bleeding. Brysen Shankman Prasad Jowell Bossi 10/06/2019,8:15 AM  LOS: 12 days  Pager: [phone]

## 2019-10-06 NOTE — Progress Notes (Signed)
Nutrition Follow-up  DOCUMENTATION CODES:   Not applicable  INTERVENTION:   Recommend continuous feeds via G-tube due to vomiting episode on 10/05/19. If pt able to tolerate continuous feeds for >48 hours without vomiting, can consider switching to bolus regimen. 6/11 Continue tube feeds via G-tube: - Nepro @ 45 ml/hr (1080 ml/day) - Pro-stat 60 ml TID  Tube feeding regimen provides 2544 kcal, 177 grams of protein, and 788 ml of H2O (meets 100% of needs).  - Continue renal MVI per tube  NUTRITION DIAGNOSIS:   Increased nutrient needs related to post-op healing as evidenced by estimated needs.  Ongoing, being addressed via TF  GOAL:   Patient will meet greater than or equal to 90% of their needs  Met via TF  MONITOR:   PO intake, Supplement acceptance, Diet advancement, Labs, Weight trends, TF tolerance, Skin, I & O's  REASON FOR ASSESSMENT:   Consult Enteral/tube feeding initiation and management  ASSESSMENT:   Patient with PMH significant for CAD s/p stenting, HTN, HLD, DM, and BPH. Presents this admission with RLL PE and for CABG.  4/14 - admitted  4/20 - CABG x 4, chest left open 4/22 - chest closure, application of incisional wound VAC 4/23 - extubated, cardiac arrest, chest re-opened for washout, clot removed, re-intubated 4/26 - return to OR for washout, cortrak placed, TF initiated 4/29 - chest washout, application of wound VAC, CRRT initiated 5/05 - chest washout, tracheostomy 5/10 - chest washout, wound VAC 5/12 - omental harvest, R pectoral muscle advancement flap, layered closure of sternal wound, wound VAC, stop CRRT 5/13 - first HD 5/20 - mediastinal exploration, evacuation of hematoma, wound VAC 5/24 - trach downsized6 cuffless 5/25 - L tunneled HD cath 5/28 - trach downsized 4 cuffless  6/07 - diet advanced to dysphagia 1, nectar-thick liquids 6/09 - s/p G-tube placement by IR 6/10 - decannulated  Per TCTS note, pt vomited everything he ate  yesterday. Pt made NPO. Per nursing documentation, coughing episode caused vomiting.  Last HD on 6/10 with 888 ml net UF. Next HD planned for tomorrow.  Pt now accepting phosphorus binders per tube. Phosphorus still high but trending down.  Spoke with pt at bedside. Pt frustrated as he believes RD is the one who made him NPO. Pt states that RD "killed him" because now he can't eat or drink water or have ice chips. Attempted to explain PA's reasoning for NPO diet order but pt remained frustrated.  Pt endorses vomiting 1 time yesterday and said he vomited "everything I ate." Pt confirms that the vomiting episode occurred because he started coughing. Pt denies nausea.  Will continue with continuous tube feeds at this time. If pt is able to demonstrate ability to tolerate continuous tube feeds for >48 hours, can consider transitioning to bolus tube feeding regimen.  Pt complaining of "itching all over my arms." RN aware.  Weight fluctuating between 171-197 lbs over the last week. Will monitor trends.  Meal Completion: 50% x 1 meal (when on dysphagia 1 diet with nectar-thick liquids)  Medications reviewed and include: SSI q 4 hours, Levemir 12 units BID, rena-vit, protonix, Renvela TID  Labs reviewed: sodium 133, phosphorus 6.0 CBG's: 120-190 x 24 hours  Diet Order:   Diet Order            Diet NPO time specified  Diet effective now                 EDUCATION NEEDS:   Not appropriate for education  at this time  Skin:  Skin Assessment: Skin Integrity Issues: Stage II: right buttocks Incisions: chest, abdomen, neck, left arm  Last BM:  10/05/19 type 6  Height:   Ht Readings from Last 1 Encounters:  08/28/19 5' 9"  (1.753 m)    Weight:   Wt Readings from Last 1 Encounters:  10/06/19 81.1 kg    BMI:  Body mass index is 26.4 kg/m.  Estimated Nutritional Needs:   Kcal:  2125-2550  Protein:  170-195 grams  Fluid:  >/= 2 L/day    Gaynell Face, MS, RD,  LDN Inpatient Clinical Dietitian Pager: [phone] Weekend/After Hours: [phone]

## 2019-10-06 NOTE — Progress Notes (Signed)
22 Days Post-Op  Subjective: Mr. Currier is a 74 year old male status post right pectoralis muscle flap advancement to cover sternal wound, application of ACell and primary closure on 09/06/2019 followed by evacuation of hematoma on 09/14/2019.  Patient reports he is doing okay today.  He does endorse some lower back pain and burning buttock pain. He does not report any tenderness or pain in his right chest or sternal area at this time.  Objective: Vital signs in last 24 hours: Temp:  [97.8 F (36.6 C)-98.8 F (37.1 C)] 97.8 F (36.6 C) (06/11 0743) Pulse Rate:  [102-112] 102 (06/11 0818) Resp:  [12-23] 12 (06/11 0818) BP: (94-125)/(58-80) 122/77 (06/11 0420) SpO2:  [96 %-100 %] 99 % (06/11 0818) FiO2 (%):  [21 %] 21 % (06/10 1250) Weight:  [81.1 kg] 81.1 kg (06/11 0420) Last BM Date: 10/05/19  Intake/Output from previous day: 06/10 0701 - 06/11 0700 In: -  Out: 1063 [Urine:175] Intake/Output this shift: No intake/output data recorded.  General appearance: alert, cooperative and no distress Neck: Capped trach in place Resp: Unlabored Chest wall: No tenderness to palpation of midline sternum or right chest wall.  No fluid collection noted.  No erythema or drainage noted.  Sternal incision is C/D/I, no dehiscence noted.  Monocryl suture knots in place. Incision/Wound: Sacral pressure dressing in place over lower back, tenderness to palpation of lower back.  Partial-thickness breakdown noted over superior buttock crease, no erythema or drainage noted.  No foul odor noted.  Lab Results:  CBC Latest Ref Rng & Units 10/06/2019 10/03/2019 10/02/2019  WBC 4.0 - 10.5 K/uL 17.1(H) 17.6(H) 17.9(H)  Hemoglobin 13.0 - 17.0 g/dL 11.1(L) 7.4(L) 7.5(L)  Hematocrit 39 - 52 % 35.4(L) 23.7(L) 24.1(L)  Platelets 150 - 400 K/uL 479(H) 500(H) 430(H)    BMET Recent Labs    10/05/19 0248 10/06/19 0315  NA 135 133*  K 4.5 3.5  CL 96* 96*  CO2 22 24  GLUCOSE 137* 170*  BUN 113* 62*  CREATININE  6.01* 4.05*  CALCIUM 9.4 9.4   PT/INR Recent Labs    10/04/19 0232  LABPROT 13.9  INR 1.1   ABG No results for input(s): PHART, HCO3 in the last 72 hours.  Invalid input(s): PCO2, PO2  Studies/Results: DG Chest 2 View  Result Date: 10/05/2019 CLINICAL DATA:  Postoperative follow-up. EXAM: CHEST - 2 VIEW COMPARISON:  Sep 23, 2019 FINDINGS: Multiple sternal fixation plates and screws are seen. There is stable tracheostomy tube and left-sided venous catheter positioning. The nasogastric tube seen on the prior study has been removed. There is no evidence of acute infiltrate or pneumothorax. A small left pleural effusion is seen. The heart size and mediastinal contours are within normal limits. The visualized skeletal structures are unremarkable. IMPRESSION: Postoperative changes, as described above, with a small left pleural effusion. Electronically Signed   By: Virgina Norfolk M.D.   On: 10/05/2019 17:37   IR GASTROSTOMY TUBE MOD SED  Result Date: 10/04/2019 CLINICAL DATA:  Dysphagia, needs enteral feeding support EXAM: PERC PLACEMENT GASTROSTOMY FLUOROSCOPY TIME:  2 minutes 18 seconds; 22 mGy TECHNIQUE: The procedure, risks, benefits, and alternatives were explained to the patient. Questions regarding the procedure were encouraged and answered. The patient understands and consents to the procedure. As antibiotic prophylaxis, cefazolin 2 g was ordered pre-procedure and administered intravenously within one hour of incision. A safe approach avoiding the transverse colon was evident fluoroscopically due to residual contrast in the lumen of the decompressed colon. A 5 French angiographic  catheter was placed as orogastric tube. The upper abdomen was prepped with Betadine, draped in usual sterile fashion, and infiltrated locally with 1% lidocaine. Intravenous Fentanyl 42mcg and Versed 1mg  were administered as conscious sedation during continuous monitoring of the patient's level of consciousness and  physiological / cardiorespiratory status by the radiology RN, with a total moderate sedation time of 10 minutes. 0.5 mg glucagon given IV to facilitate gastric distention. Stomach was insufflated using air through the orogastric tube. An 68 French sheath needle was advanced percutaneously into the gastric lumen under fluoroscopy. Gas could be aspirated and a small contrast injection confirmed intraluminal spread. The sheath was exchanged over a guidewire for a 9 [place] vascular sheath, through which the snare device was advanced and used to snare a guidewire passed through the orogastric tube. This was withdrawn, and the snare attached to the 20 French pull-through gastrostomy tube, which was advanced antegrade, positioned with the internal bumper securing the anterior gastric wall to the anterior abdominal wall. Small contrast injection confirms appropriate positioning. The external bumper was applied and the catheter was flushed. COMPLICATIONS: COMPLICATIONS none IMPRESSION: 1. Technically successful 20 French pull-through gastrostomy placement under fluoroscopy. Electronically Signed   By: Lucrezia Europe M.D.   On: 10/04/2019 17:03    Anti-infectives: Anti-infectives (From admission, onward)   Start     Dose/Rate Route Frequency Ordered Stop   10/04/19 0800  ceFAZolin (ANCEF) IVPB 2g/100 mL premix        2 g 200 mL/hr over 30 Minutes Intravenous To Radiology 10/03/19 1045 10/04/19 1545   09/19/19 1417  ceFAZolin (ANCEF) 2-4 GM/100ML-% IVPB       Note to Pharmacy: Rudene Re   : cabinet override      09/19/19 1417 09/20/19 0229   09/19/19 1330  ceFAZolin (ANCEF) IVPB 1 g/50 mL premix  Status:  Discontinued        1 g 100 mL/hr over 30 Minutes Intravenous Once 09/19/19 1317 09/19/19 1329   09/19/19 1330  ceFAZolin (ANCEF) IVPB 2g/100 mL premix        2 g 200 mL/hr over 30 Minutes Intravenous  Once 09/19/19 1327 09/19/19 1448   09/14/19 0915  vancomycin (VANCOCIN) IVPB 1000 mg/200 mL premix         1,000 mg 200 mL/hr over 60 Minutes Intravenous To Surgery 09/14/19 0907 09/15/19 0915   09/07/19 2000  vancomycin (VANCOREADY) IVPB 750 mg/150 mL        750 mg 150 mL/hr over 60 Minutes Intravenous  Once 09/07/19 1510 09/07/19 2245   09/06/19 2200  meropenem (MERREM) 500 mg in sodium chloride 0.9 % 100 mL IVPB        500 mg 200 mL/hr over 30 Minutes Intravenous Every 24 hours 09/06/19 1542 09/09/19 0015   09/06/19 2000  fluconazole (DIFLUCAN) IVPB 400 mg        400 mg 100 mL/hr over 120 Minutes Intravenous Every 24 hours 09/06/19 1542 09/08/19 2240   09/06/19 1800  vancomycin (VANCOREADY) IVPB 750 mg/150 mL        750 mg 150 mL/hr over 60 Minutes Intravenous  Once 09/06/19 1542 09/07/19 0022   09/06/19 1540  vancomycin variable dose per unstable renal function (pharmacist dosing)         Does not apply See admin instructions 09/06/19 1542 09/08/19 2359   09/06/19 1100  ceFAZolin (ANCEF) IVPB 2g/100 mL premix  Status:  Discontinued        2 g 200 mL/hr over 30 Minutes  Intravenous 30 min pre-op 09/05/19 1717 09/06/19 1644   09/06/19 0600  ceFAZolin (ANCEF) IVPB 2g/100 mL premix  Status:  Discontinued        2 g 200 mL/hr over 30 Minutes Intravenous To Short Stay 09/06/19 0546 09/06/19 1644   09/02/19 2200  meropenem (MERREM) 1 g in sodium chloride 0.9 % 100 mL IVPB  Status:  Discontinued        1 g 200 mL/hr over 30 Minutes Intravenous Every 8 hours 09/02/19 1011 09/06/19 1542   09/02/19 2000  fluconazole (DIFLUCAN) IVPB 800 mg  Status:  Discontinued        800 mg 100 mL/hr over 240 Minutes Intravenous Every 24 hours 09/02/19 1011 09/06/19 1542   09/02/19 2000  fluconazole (DIFLUCAN) IVPB 800 mg  Status:  Discontinued        800 mg 200 mL/hr over 120 Minutes Intravenous Every 24 hours 09/02/19 1329 09/02/19 1330   09/02/19 1800  vancomycin (VANCOCIN) IVPB 1000 mg/200 mL premix  Status:  Discontinued        1,000 mg 200 mL/hr over 60 Minutes Intravenous Every 24 hours 09/02/19 1011  09/06/19 1542   09/02/19 0900  meropenem (MERREM) 1 g in sodium chloride 0.9 % 100 mL IVPB  Status:  Discontinued        1 g 200 mL/hr over 30 Minutes Intravenous Every 24 hours 09/01/19 1333 09/02/19 1011   09/01/19 1700  fluconazole (DIFLUCAN) IVPB 800 mg        800 mg 100 mL/hr over 240 Minutes Intravenous  Once 09/01/19 1645 09/01/19 2057   09/01/19 1630  fluconazole (DIFLUCAN) IVPB 400 mg  Status:  Discontinued        400 mg 100 mL/hr over 120 Minutes Intravenous  Once 09/01/19 1629 09/01/19 1645   09/01/19 1330  vancomycin variable dose per unstable renal function (pharmacist dosing)  Status:  Discontinued         Does not apply See admin instructions 09/01/19 1333 09/02/19 1325   09/01/19 0730  meropenem (MERREM) 1 g in sodium chloride 0.9 % 100 mL IVPB  Status:  Discontinued        1 g 200 mL/hr over 30 Minutes Intravenous Every 8 hours 09/01/19 0729 09/01/19 1327   08/31/19 2200  vancomycin (VANCOCIN) IVPB 1000 mg/200 mL premix  Status:  Discontinued        1,000 mg 200 mL/hr over 60 Minutes Intravenous Every 24 hours 08/30/19 2235 09/01/19 1330   08/25/19 0800  vancomycin (VANCOREADY) IVPB 1250 mg/250 mL  Status:  Discontinued        1,250 mg 166.7 mL/hr over 90 Minutes Intravenous Every 24 hours 08/24/19 2121 08/24/19 2128   08/25/19 0100  vancomycin (VANCOREADY) IVPB 1250 mg/250 mL  Status:  Discontinued        1,250 mg 166.7 mL/hr over 90 Minutes Intravenous Every 24 hours 08/24/19 2128 08/30/19 2235   08/24/19 2300  ceFEPIme (MAXIPIME) 2 g in sodium chloride 0.9 % 100 mL IVPB  Status:  Discontinued        2 g 200 mL/hr over 30 Minutes Intravenous Every 12 hours 08/24/19 2150 09/01/19 0729   08/24/19 1338  vancomycin (VANCOCIN) 1,000 mg in sodium chloride 0.9 % 1,000 mL irrigation  Status:  Discontinued          As needed 08/24/19 1338 08/24/19 1508   08/21/19 0000  ceFEPIme (MAXIPIME) 1 g in sodium chloride 0.9 % 100 mL IVPB  Status:  Discontinued  1 g 200 mL/hr  over 30 Minutes Intravenous Every 24 hours 08/20/19 0954 08/24/19 2150   08/20/19 1200  vancomycin (VANCOCIN) IVPB 1000 mg/200 mL premix        1,000 mg 200 mL/hr over 60 Minutes Intravenous  Once 08/20/19 0954 08/21/19 0011   08/19/19 2129  vancomycin variable dose per unstable renal function (pharmacist dosing)  Status:  Discontinued         Does not apply See admin instructions 08/19/19 2129 08/24/19 2128   08/19/19 0000  ceFEPIme (MAXIPIME) 2 g in sodium chloride 0.9 % 100 mL IVPB  Status:  Discontinued        2 g 200 mL/hr over 30 Minutes Intravenous Every 24 hours 08/18/19 2351 08/20/19 0954   08/19/19 0000  vancomycin (VANCOREADY) IVPB 2000 mg/400 mL        2,000 mg 200 mL/hr over 120 Minutes Intravenous  Once 08/18/19 2351 08/19/19 0354   08/16/19 1731  vancomycin (VANCOCIN) powder  Status:  Discontinued          As needed 08/16/19 1732 08/16/19 1808   08/15/19 2045  vancomycin (VANCOCIN) IVPB 1000 mg/200 mL premix        1,000 mg 200 mL/hr over 60 Minutes Intravenous  Once 08/15/19 1402 08/15/19 2209   08/15/19 1645  cefUROXime (ZINACEF) 1.5 g in sodium chloride 0.9 % 100 mL IVPB        1.5 g 200 mL/hr over 30 Minutes Intravenous Every 12 hours 08/15/19 1402 08/17/19 0700   08/15/19 0958  vancomycin (VANCOCIN) powder  Status:  Discontinued          As needed 08/15/19 1000 08/15/19 1402   08/15/19 0400  vancomycin (VANCOREADY) IVPB 1500 mg/300 mL        1,500 mg 150 mL/hr over 120 Minutes Intravenous To Surgery 08/14/19 1121 08/15/19 0830   08/15/19 0400  cefUROXime (ZINACEF) 1.5 g in sodium chloride 0.9 % 100 mL IVPB        1.5 g 200 mL/hr over 30 Minutes Intravenous To Surgery 08/14/19 1121 08/15/19 0812   08/15/19 0400  cefUROXime (ZINACEF) 750 mg in sodium chloride 0.9 % 100 mL IVPB        750 mg 200 mL/hr over 30 Minutes Intravenous To Surgery 08/14/19 1121 08/15/19 1300      Assessment/Plan: s/p Procedure(s): EVACUATION OF HEMATOMA on 09/14/2019. Right pectoralis  muscle advancement flap, layered closure of sternal wound and application of ACell on 09/06/2019.  Sacral wound noted on exam, recommend offloading every 1 hour. Sacral pressure dressing in place. If offloading is too difficult for patient, may need air mattress bed.   Sternal incision is healing nicely without any dehiscence.  No sign of infection or fluid collections. Recommend applying new Mepilex border dressing today, removed previous dressing this a.m.     LOS: 67 days    Charlies Constable, PA-C 10/06/2019

## 2019-10-06 NOTE — Progress Notes (Signed)
Renal Navigator notes that patient's trach has now been removed. Navigator met with patient at bedside who had a great sense of humor and was pleasant and welcoming of the visit. He reports that dialysis was really tough yesterday, but that otherwise he thinks it's been going pretty well. Navigator encouraged him to talk with the Nephrologist about how he feels, but reminded him that he just had surgery on Wednesday and his body is still healing.  Navigator explained role and discussed referral to OP HD treatment center (at this point for AKI). Patient aware and appreciative. He reports that Navigator will need to discuss this with his wife. Navigator agrees. Navigator informed patient of location where Navigator plans to input referral-[facility] on [address] He is agreeable.  Renal Navigator informed patient that he will dialyze in a recliner in the OP HD setting, 3 times per week, and that the referral will request a 2nd shift chair if possible, as Navigator feels a patient with this level of debility would be difficult in a first shift spot. Referral submitted to [facility]. Navigator left message for patient's wife Pamala Hurry to return call at her convenience. Navigator will follow closely for discharge planning.   Alphonzo Cruise,  Renal Navigator [phone]

## 2019-10-06 NOTE — TOC Progression Note (Addendum)
Transition of Care [facility]) - Progression Note    Patient Details  Name: DOMIQUE CLAPPER MRN: [id number] Date of Birth: Dec 04, 1945  Transition of Care [facility]) CM/SW [place], Man Phone Number:  10/06/2019, 11:51 AM  Clinical Narrative:    Lurline Idol decannulated, pt referral made for OP HD per renal navigator [place], CSW. Trialing HD in chair. Pt previously had accepted offer at [facility], will need to ensure pt offer still available dependent on dialysis center.    Expected Discharge Plan: [place] Barriers to Discharge: Continued Medical Work up  Expected Discharge Plan and Services Expected Discharge Plan: [place] arrangements for the past 2 months: Single Family Home    Readmission Risk Interventions No flowsheet data found.

## 2019-10-06 NOTE — Progress Notes (Signed)
[place]       [place] [address]             [phone]      22 Days Post-Op Procedure(s) (LRB): EVACUATION OF HEMATOMA (N/A) Subjective: decannulated yesterday , vomited everything he ate yesterday  Objective: Vital signs in last 24 hours: Temp:  [97.8 F (36.6 C)-98.8 F (37.1 C)] 97.8 F (36.6 C) (06/11 0743) Pulse Rate:  [103-112] 105 (06/11 0420) Cardiac Rhythm: Sinus tachycardia (06/11 0730) Resp:  [15-23] 20 (06/11 0420) BP: (94-125)/(58-80) 122/77 (06/11 0420) SpO2:  [96 %-99 %] 98 % (06/11 0420) FiO2 (%):  [21 %] 21 % (06/10 1250) Weight:  [81.1 kg] 81.1 kg (06/11 0420)  Hemodynamic parameters for last 24 hours:    Intake/Output from previous day: 06/10 0701 - 06/11 0700 In: -  Out: 1063 [Urine:175] Intake/Output this shift: No intake/output data recorded.  General appearance: alert, cooperative, fatigued and no distress Heart: regular rate and rhythm Lungs: dim left base Abdomen: mild tenderness at g tube site Extremities: no edema Wound: incis stable, no obvious signs of infection  Lab Results: Recent Labs    10/06/19 0316  WBC 17.1*  HGB 11.1*  HCT 35.4*  PLT 479*   BMET:  Recent Labs    10/05/19 0248 10/06/19 0315  NA 135 133*  K 4.5 3.5  CL 96* 96*  CO2 22 24  GLUCOSE 137* 170*  BUN 113* 62*  CREATININE 6.01* 4.05*  CALCIUM 9.4 9.4    PT/INR:  Recent Labs    10/04/19 0232  LABPROT 13.9  INR 1.1   ABG    Component Value Date/Time   PHART 7.481 (H) 09/07/2019 0029   HCO3 23.3 09/07/2019 0029   TCO2 24 09/07/2019 0029   ACIDBASEDEF 5.0 (H) 08/18/2019 2341   O2SAT 99.0 09/07/2019 0029   CBG (last 3)  Recent Labs    10/05/19 2334 10/06/19 0417 10/06/19 0719  GLUCAP 190* 186* 176*    Meds Scheduled Meds: .  stroke: mapping our early stages of recovery book   Does not apply Once  . sodium chloride   Intravenous Once  . amiodarone  200 mg Per Tube Daily  . arformoterol  15 mcg  Nebulization BID  . aspirin  81 mg Per Tube Daily  . atorvastatin  40 mg Per Tube Daily  . bisacodyl  10 mg Rectal Daily  . chlorhexidine  15 mL Mouth Rinse BID  . Chlorhexidine Gluconate Cloth  6 each Topical Q0600  . clonazePAM  0.5 mg Per Tube BID  . darbepoetin (ARANESP) injection - DIALYSIS  100 mcg Intravenous Q Tue-HD  . feeding supplement (PRO-STAT SUGAR FREE 64)  60 mL Per Tube TID  . insulin aspart  0-15 Units Subcutaneous Q4H  . insulin detemir  12 Units Subcutaneous BID  . mouth rinse  15 mL Mouth Rinse q12n4p  . midodrine  5 mg Per Tube TID WC  . multivitamin  1 tablet Oral QHS  . nystatin  5 mL Oral QID  . pantoprazole sodium  40 mg Per Tube Daily  . revefenacin  175 mcg Nebulization Daily  . sertraline  25 mg Per Tube Daily  . sevelamer carbonate  1.6 g Per Tube TID WC  . trospium  20 mg Per Tube BID   Continuous Infusions: . sodium chloride Stopped (09/14/19 0802)  . feeding supplement (NEPRO CARB STEADY) 1,000 mL (10/05/19 1828)  . ferric gluconate (FERRLECIT/NULECIT) IV Stopped (  10/03/19 1213)  . lactated ringers 20 mL/hr at 08/20/19 1200   PRN Meds:.acetaminophen (TYLENOL) oral liquid 160 mg/5 mL, albuterol, dextrose, Gerhardt's butt cream, guaiFENesin, hydrALAZINE, HYDROcodone-acetaminophen, hydrocortisone cream, HYDROmorphone (DILAUDID) injection, HYDROmorphone (DILAUDID) injection, ondansetron (ZOFRAN) IV, ondansetron (ZOFRAN) IV, oxyCODONE, phenol, Resource ThickenUp Clear, sennosides, sodium chloride flush  Xrays DG Chest 2 View  Result Date: 10/05/2019 CLINICAL DATA:  Postoperative follow-up. EXAM: CHEST - 2 VIEW COMPARISON:  Sep 23, 2019 FINDINGS: Multiple sternal fixation plates and screws are seen. There is stable tracheostomy tube and left-sided venous catheter positioning. The nasogastric tube seen on the prior study has been removed. There is no evidence of acute infiltrate or pneumothorax. A small left pleural effusion is seen. The heart size and  mediastinal contours are within normal limits. The visualized skeletal structures are unremarkable. IMPRESSION: Postoperative changes, as described above, with a small left pleural effusion. Electronically Signed   By: Virgina Norfolk M.D.   On: 10/05/2019 17:37   IR GASTROSTOMY TUBE MOD SED  Result Date: 10/04/2019 CLINICAL DATA:  Dysphagia, needs enteral feeding support EXAM: PERC PLACEMENT GASTROSTOMY FLUOROSCOPY TIME:  2 minutes 18 seconds; 22 mGy TECHNIQUE: The procedure, risks, benefits, and alternatives were explained to the patient. Questions regarding the procedure were encouraged and answered. The patient understands and consents to the procedure. As antibiotic prophylaxis, cefazolin 2 g was ordered pre-procedure and administered intravenously within one hour of incision. A safe approach avoiding the transverse colon was evident fluoroscopically due to residual contrast in the lumen of the decompressed colon. A 5 French angiographic catheter was placed as orogastric tube. The upper abdomen was prepped with Betadine, draped in usual sterile fashion, and infiltrated locally with 1% lidocaine. Intravenous Fentanyl 41mcg and Versed 1mg  were administered as conscious sedation during continuous monitoring of the patient's level of consciousness and physiological / cardiorespiratory status by the radiology RN, with a total moderate sedation time of 10 minutes. 0.5 mg glucagon given IV to facilitate gastric distention. Stomach was insufflated using air through the orogastric tube. An 63 French sheath needle was advanced percutaneously into the gastric lumen under fluoroscopy. Gas could be aspirated and a small contrast injection confirmed intraluminal spread. The sheath was exchanged over a guidewire for a 9 [place] vascular sheath, through which the snare device was advanced and used to snare a guidewire passed through the orogastric tube. This was withdrawn, and the snare attached to the 20 French  pull-through gastrostomy tube, which was advanced antegrade, positioned with the internal bumper securing the anterior gastric wall to the anterior abdominal wall. Small contrast injection confirms appropriate positioning. The external bumper was applied and the catheter was flushed. COMPLICATIONS: COMPLICATIONS none IMPRESSION: 1. Technically successful 20 French pull-through gastrostomy placement under fluoroscopy. Electronically Signed   By: Lucrezia Europe M.D.   On: 10/04/2019 17:03    Assessment/Plan: S/P Procedure(s) (LRB): EVACUATION OF HEMATOMA (N/A)  1 afebrile , VSS 2 sinus tachy, low 100 3 sats ok on RA- decannulated 4 vomited oral intake, will make NPO for now 5 cont therapies 6 nephrology managing HD issues 7 hopefully SNF soon    LOS: 58 days    John Giovanni PA-C Pager [phone] 10/06/2019

## 2019-10-06 NOTE — Progress Notes (Signed)
  Speech Language Pathology Treatment: Dysphagia  Patient Details Name: Christian [name] MRN: [id number] DOB: Nov 16, 1945 Today's Date: 10/06/2019 Time: 5885-0277 SLP Time Calculation (min) (ACUTE ONLY): 35 min  Assessment / Plan / Recommendation Clinical Impression  Pt was seen for skilled ST targeting dysphagia goals.  Pt had been made NPO by MD due to nausea and vomiting yesterday.  Discussed with RN who cleared pt for participation in PO trials.  Pt consumed ice chips, thin liquids via small straw sips, puree, and nectar thick liquids via straw sips with no overt s/s of aspiration.  Pt may resume previously recommended diet of pureed textures and nectar thick liquids as medical team feels its appropriate.  Pt appears to be making progress towards diet progression, especially in light of decannulation since FEES.   Continue per current plan of care.    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 4/23 cardiac arrest, 4/29 initiated CRRT, vent s/p trach. 5/10 additional sternal washout unable to close.  5/12 sternal plating with pectoralis flap and omental harvest to mediastinum off CRRT with iHD started. Chest wound closed. Neurology consulted for left sided weakness- CTH neg 09/08/19. 5/20 pt with emergent hematoma evacuation and transition to trach collar; 5/28 downsized to #4 cuffless. PMHx: BPH, CAD, DM, HLD, HTN Had MBS on 09/30/19, weakness, poor visualization of airway, could not recommend thin liquids. FEES repeated to determine best diet textures.       SLP Plan  Continue with current plan of care       Recommendations  Diet recommendations: Dysphagia 1 (puree);Nectar-thick liquid Liquids provided via: Cup;Straw Medication Administration: Via alternative means Supervision: Full supervision/cueing for compensatory strategies Compensations: Slow rate;Small sips/bites;Effortful swallow;Follow solids with  liquid Postural Changes and/or Swallow Maneuvers: Upright 30-60 min after meal;Seated upright 90 degrees                Oral Care Recommendations: Oral care BID Follow up Recommendations: 24 hour supervision/assistance SLP Visit Diagnosis: Dysphagia, oropharyngeal phase (R13.12) Plan: Continue with current plan of care       GO                PageSelinda Orion 10/06/2019, 4:04 PM

## 2019-10-06 NOTE — Progress Notes (Signed)
Renal Navigator sent message to Nephrologist/Dr. Carolin Sicks to suggest that patient have HD in a recliner prior to discharge, though RN states he can tolerate sitting in chair in room, which is positive. Navigator has also requested an updated HepB lab be drawn with HD tomorrow, as the OP HD clinic requires one every 30 days and patient's last one was drawn on 5/13.  Christian [name], [place] Renal Navigator [phone]

## 2019-10-07 LAB — RENAL FUNCTION PANEL
Albumin: 2.6 g/dL — ABNORMAL LOW (ref 3.5–5.0)
Anion gap: 17 — ABNORMAL HIGH (ref 5–15)
BUN: 98 mg/dL — ABNORMAL HIGH (ref 8–23)
CO2: 23 mmol/L (ref 22–32)
Calcium: 9.1 mg/dL (ref 8.9–10.3)
Chloride: 94 mmol/L — ABNORMAL LOW (ref 98–111)
Creatinine, Ser: 5.46 mg/dL — ABNORMAL HIGH (ref 0.61–1.24)
GFR calc Af Amer: 11 mL/min — ABNORMAL LOW (ref >60)
GFR calc non Af Amer: 10 mL/min — ABNORMAL LOW (ref >60)
Glucose, Bld: 165 mg/dL — ABNORMAL HIGH (ref 70–99)
Phosphorus: 7.5 mg/dL — ABNORMAL HIGH (ref 2.5–4.6)
Potassium: 3.9 mmol/L (ref 3.5–5.1)
Sodium: 134 mmol/L — ABNORMAL LOW (ref 135–145)

## 2019-10-07 LAB — CBC
HCT: 31.2 % — ABNORMAL LOW (ref 39.0–52.0)
Hemoglobin: 9.7 g/dL — ABNORMAL LOW (ref 13.0–17.0)
MCH: 31.2 pg (ref 26.0–34.0)
MCHC: 31.1 g/dL (ref 30.0–36.0)
MCV: 100.3 fL — ABNORMAL HIGH (ref 80.0–100.0)
Platelets: 472 10*3/uL — ABNORMAL HIGH (ref 150–400)
RBC: 3.11 MIL/uL — ABNORMAL LOW (ref 4.22–5.81)
RDW: 19.3 % — ABNORMAL HIGH (ref 11.5–15.5)
WBC: 18.1 10*3/uL — ABNORMAL HIGH (ref 4.0–10.5)
nRBC: 0 % (ref 0.0–0.2)

## 2019-10-07 LAB — GLUCOSE, CAPILLARY
Glucose-Capillary: 145 mg/dL — ABNORMAL HIGH (ref 70–99)
Glucose-Capillary: 158 mg/dL — ABNORMAL HIGH (ref 70–99)
Glucose-Capillary: 203 mg/dL — ABNORMAL HIGH (ref 70–99)
Glucose-Capillary: 209 mg/dL — ABNORMAL HIGH (ref 70–99)

## 2019-10-07 LAB — MAGNESIUM: Magnesium: 2.6 mg/dL — ABNORMAL HIGH (ref 1.7–2.4)

## 2019-10-07 LAB — HEPATITIS B SURFACE ANTIGEN: Hepatitis B Surface Ag: NONREACTIVE

## 2019-10-07 MED ORDER — PENTAFLUOROPROP-TETRAFLUOROETH EX AERO
1.0000 "application " | INHALATION_SPRAY | CUTANEOUS | Status: DC | PRN
  Filled 2019-10-07: qty 116

## 2019-10-07 MED ORDER — HEPARIN SODIUM (PORCINE) 1000 UNIT/ML DIALYSIS
20.0000 [IU]/kg | INTRAMUSCULAR | Status: DC | PRN
  Filled 2019-10-07: qty 2

## 2019-10-07 MED ORDER — HEPARIN SODIUM (PORCINE) 1000 UNIT/ML DIALYSIS
1000.0000 [IU] | INTRAMUSCULAR | Status: DC | PRN
  Filled 2019-10-07: qty 1

## 2019-10-07 MED ORDER — SODIUM CHLORIDE 0.9 % IV SOLN: 100.0000 mL | INTRAVENOUS | Status: DC | PRN

## 2019-10-07 MED ORDER — ALTEPLASE 2 MG IJ SOLR
2.0000 mg | Freq: Once | INTRAMUSCULAR | Status: DC | PRN
  Filled 2019-10-07: qty 2

## 2019-10-07 MED ORDER — LIDOCAINE-PRILOCAINE 2.5-2.5 % EX CREA
1.0000 "application " | TOPICAL_CREAM | CUTANEOUS | Status: DC | PRN
  Filled 2019-10-07: qty 5

## 2019-10-07 MED ORDER — LIDOCAINE HCL (PF) 1 % IJ SOLN: 5.0000 mL | INTRAMUSCULAR | Status: DC | PRN

## 2019-10-07 NOTE — Progress Notes (Addendum)
[address]       [employer] [address]             [phone]      23 Days Post-Op Procedure(s) (LRB): EVACUATION OF HEMATOMA (N/A) Subjective: tol diet and TF's without further vomiting  Objective: Vital signs in last 24 hours: Temp:  [97.8 F (36.6 C)-98.4 F (36.9 C)] 98 F (36.7 C) (06/12 0833) Pulse Rate:  [99-105] 100 (06/12 0414) Cardiac Rhythm: Sinus tachycardia (06/12 0833) Resp:  [13-20] 20 (06/12 0414) BP: (130-154)/(77-88) 154/86 (06/12 0414) SpO2:  [98 %-100 %] 100 % (06/12 0733) FiO2 (%):  [21 %] 21 % (06/12 0733) Weight:  [83 kg] 83 kg (06/12 0414)  Hemodynamic parameters for last 24 hours:    Intake/Output from previous day: No intake/output data recorded. Intake/Output this shift: No intake/output data recorded.  General appearance: alert, cooperative and no distress Heart: regular rate and rhythm Lungs: dim left base Abdomen: benign Extremities: no edema Wound: incis all stable with steady healing  Lab Results: Recent Labs    10/06/19 0316  WBC 17.1*  HGB 11.1*  HCT 35.4*  PLT 479*   BMET:  Recent Labs    10/06/19 0315 10/07/19 0301  NA 133* 134*  K 3.5 3.9  CL 96* 94*  CO2 24 23  GLUCOSE 170* 165*  BUN 62* 98*  CREATININE 4.05* 5.46*  CALCIUM 9.4 9.1    PT/INR: No results for input(s): LABPROT, INR in the last 72 hours. ABG    Component Value Date/Time   PHART 7.481 (H) 09/07/2019 0029   HCO3 23.3 09/07/2019 0029   TCO2 24 09/07/2019 0029   ACIDBASEDEF 5.0 (H) 08/18/2019 2341   O2SAT 99.0 09/07/2019 0029   CBG (last 3)  Recent Labs    10/06/19 2319 10/07/19 0413 10/07/19 0752  GLUCAP 209* 145* 158*    Meds Scheduled Meds: .  stroke: mapping our early stages of recovery book   Does not apply Once  . sodium chloride   Intravenous Once  . amiodarone  200 mg Per Tube Daily  . arformoterol  15 mcg Nebulization BID  . aspirin  81 mg Per Tube Daily  . atorvastatin  40 mg Per Tube Daily  .  bisacodyl  10 mg Rectal Daily  . chlorhexidine  15 mL Mouth Rinse BID  . Chlorhexidine Gluconate Cloth  6 each Topical Q0600  . clonazePAM  0.5 mg Per Tube BID  . darbepoetin (ARANESP) injection - DIALYSIS  100 mcg Intravenous Q Tue-HD  . feeding supplement (PRO-STAT SUGAR FREE 64)  60 mL Per Tube TID  . insulin aspart  0-15 Units Subcutaneous Q4H  . insulin detemir  12 Units Subcutaneous BID  . mouth rinse  15 mL Mouth Rinse q12n4p  . midodrine  5 mg Per Tube TID WC  . multivitamin  1 tablet Per Tube QHS  . nystatin  5 mL Oral QID  . pantoprazole sodium  40 mg Per Tube Daily  . revefenacin  175 mcg Nebulization Daily  . sertraline  25 mg Per Tube Daily  . sevelamer carbonate  1.6 g Per Tube TID WC  . trospium  20 mg Per Tube BID   Continuous Infusions: . sodium chloride Stopped (09/14/19 0802)  . feeding supplement (NEPRO CARB STEADY) 1,000 mL (10/06/19 2313)  . ferric gluconate (FERRLECIT/NULECIT) IV Stopped (10/03/19 1213)  . lactated ringers 20 mL/hr at 08/20/19 1200   PRN Meds:.acetaminophen (TYLENOL) oral liquid 160  mg/5 mL, albuterol, dextrose, Gerhardt's butt cream, guaiFENesin, hydrALAZINE, HYDROcodone-acetaminophen, hydrocortisone cream, HYDROmorphone (DILAUDID) injection, HYDROmorphone (DILAUDID) injection, ondansetron (ZOFRAN) IV, ondansetron (ZOFRAN) IV, oxyCODONE, phenol, Resource ThickenUp Clear, sennosides, sodium chloride flush  Xrays DG Chest 2 View  Result Date: 10/05/2019 CLINICAL DATA:  Postoperative follow-up. EXAM: CHEST - 2 VIEW COMPARISON:  Sep 23, 2019 FINDINGS: Multiple sternal fixation plates and screws are seen. There is stable tracheostomy tube and left-sided venous catheter positioning. The nasogastric tube seen on the prior study has been removed. There is no evidence of acute infiltrate or pneumothorax. A small left pleural effusion is seen. The heart size and mediastinal contours are within normal limits. The visualized skeletal structures are  unremarkable. IMPRESSION: Postoperative changes, as described above, with a small left pleural effusion. Electronically Signed   By: Virgina Norfolk M.D.   On: 10/05/2019 17:37    Assessment/Plan: S/P Procedure(s) (LRB): EVACUATION OF HEMATOMA (N/A)  1 steady slow progress 2 afebrile with stable VS, some systolic htn, tachy at times 3 sats good on ra 4 tol current diet and TF's 5 tol decannulation of trach 6 conts therapies 7 SNF at D/C, SW is managing bed request  LOS: 75 days    John Giovanni PA-C Pager [phone] 10/07/2019 Patient seen and examined, agree with above  Remo Lipps C. Roxan Hockey, MD [facility] [phone]

## 2019-10-07 NOTE — Progress Notes (Signed)
[facility] NEPHROLOGY PROGRESS NOTE  Assessment/ Plan: Pt is a 74 y.o. yo male is status post CABG and had a cardiac arrest due to tamponade, developed AKI requiring dialysis.  Previously the creatinine level was 1.22 in 06/2019.  #Acute kidney injury following cardiac cath, CABG, cardiogenic shock and cardiac arrest.  Started CRRT from 4/29-5/12 to manage volume.  He is now tolerating intermittent hemodialysis.   Status post right IJ TDC by IR on 5/25. No permanent access because of AKI. Plan for regular HD today, we will attempt while he is on chair. Arrangement for outpatient HD for AKI ongoing, discussed with renal navigator.  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  # Postoperative acute hypoxic respiratory failure on h/o COPD-extubated and now the tracheostomy tube is capped.  # Anemia of critical illness: Iron saturation 14% therefore received IV iron with dialysis.  Continue ESA.  Transfusion as needed.  #CKD-MBD: Continue Renvela dose.  Monitor phosphorus level.  # A fib per primary.  #Protein calorie malnutrition currently status post feeding tube placement.  Per primary team.  Subjective: Seen and examined at bedside.  No new event.  Denies nausea, vomiting, chest pain, shortness of breath.  Plan for HD today.  Objective Vital signs in last 24 hours: Vitals:   10/06/19 2321 10/07/19 0414 10/07/19 0730 10/07/19 0733  BP: (!) 141/84 (!) 154/86    Pulse: 99 100    Resp: 17 20    Temp: 98.3 F (36.8 C) 98.4 F (36.9 C)    TempSrc: Oral Oral    SpO2: 98% 98% 100% 100%  Weight:  83 kg    Height:       Weight change: 0 kg  Intake/Output Summary (Last 24 hours) at 10/07/2019 0750 Last data filed at 10/06/2019 1307 Gross per 24  hour  Intake 0 ml  Output --  Net 0 ml       Labs: Basic Metabolic Panel: Recent Labs  Lab 10/05/19 0248 10/06/19 0315 10/07/19 0301  NA 135 133* 134*  K 4.5 3.5 3.9  CL 96* 96* 94*  CO2 22 24 23   GLUCOSE 137* 170* 165*  BUN 113* 62* 98*  CREATININE 6.01* 4.05* 5.46*  CALCIUM 9.4 9.4 9.1  PHOS 9.0* 6.0* 7.5*   Liver Function Tests: Recent Labs  Lab 10/05/19 0248 10/06/19 0315 10/07/19 0301  ALBUMIN 2.6* 2.7* 2.6*   No results for input(s): LIPASE, AMYLASE in the last 168 hours. No results for input(s): AMMONIA in the last 168 hours. CBC: Recent Labs  Lab 10/02/19 0149 10/03/19 0759 10/06/19 0316  WBC 17.9* 17.6* 17.1*  HGB 7.5* 7.4* 11.1*  HCT 24.1* 23.7* 35.4*  MCV 99.2 100.0 99.7  PLT 430* 500* 479*   Cardiac Enzymes: No results for input(s): CKTOTAL, CKMB, CKMBINDEX, TROPONINI in the last 168 hours. CBG: Recent Labs  Lab 10/06/19 1133 10/06/19 1609 10/06/19 1947 10/06/19 2319 10/07/19 0413  GLUCAP 146* 138* 185* 209* 145*    Iron Studies:  No results for input(s): IRON, TIBC, TRANSFERRIN, FERRITIN in the last 72 hours. Studies/Results: DG Chest 2 View  Result Date: 10/05/2019 CLINICAL DATA:  Postoperative follow-up. EXAM: CHEST - 2 VIEW COMPARISON:  Sep 23, 2019 FINDINGS: Multiple sternal fixation plates and screws are seen. There is stable tracheostomy tube  and left-sided venous catheter positioning. The nasogastric tube seen on the prior study has been removed. There is no evidence of acute infiltrate or pneumothorax. A small left pleural effusion is seen. The heart size and mediastinal contours are within normal limits. The visualized skeletal structures are unremarkable. IMPRESSION: Postoperative changes, as described above, with a small left pleural effusion. Electronically Signed   By: Virgina Norfolk M.D.   On: 10/05/2019 17:37    Medications: Infusions: . sodium chloride Stopped (09/14/19 0802)  . feeding supplement (NEPRO CARB  STEADY) 1,000 mL (10/06/19 2313)  . ferric gluconate (FERRLECIT/NULECIT) IV Stopped (10/03/19 1213)  . lactated ringers 20 mL/hr at 08/20/19 1200    Scheduled Medications: .  stroke: mapping our early stages of recovery book   Does not apply Once  . sodium chloride   Intravenous Once  . amiodarone  200 mg Per Tube Daily  . arformoterol  15 mcg Nebulization BID  . aspirin  81 mg Per Tube Daily  . atorvastatin  40 mg Per Tube Daily  . bisacodyl  10 mg Rectal Daily  . chlorhexidine  15 mL Mouth Rinse BID  . Chlorhexidine Gluconate Cloth  6 each Topical Q0600  . clonazePAM  0.5 mg Per Tube BID  . darbepoetin (ARANESP) injection - DIALYSIS  100 mcg Intravenous Q Tue-HD  . feeding supplement (PRO-STAT SUGAR FREE 64)  60 mL Per Tube TID  . insulin aspart  0-15 Units Subcutaneous Q4H  . insulin detemir  12 Units Subcutaneous BID  . mouth rinse  15 mL Mouth Rinse q12n4p  . midodrine  5 mg Per Tube TID WC  . multivitamin  1 tablet Per Tube QHS  . nystatin  5 mL Oral QID  . pantoprazole sodium  40 mg Per Tube Daily  . revefenacin  175 mcg Nebulization Daily  . sertraline  25 mg Per Tube Daily  . sevelamer carbonate  1.6 g Per Tube TID WC  . trospium  20 mg Per Tube BID    have reviewed scheduled and prn medications.  Physical Exam: General:NAD, looks comfortable Heart:RRR, s1s2 nl Lungs: Clear bilateral, no wheezing Abdomen:soft, Non-tender, non-distended, gastric feeding tube Extremities:No edema Dialysis Access: Right IJ TDC, clean site with no evidence of bleeding. Carleen Rhue Prasad Airi Copado 10/07/2019,7:50 AM  LOS: 59 days  Pager: [phone]

## 2019-10-07 NOTE — Plan of Care (Signed)

## 2019-10-07 NOTE — Progress Notes (Signed)
Pt refused night time medications. Pt educated on risk and benefits of receiving them and denied them. Will continue to monitor.

## 2019-10-08 ENCOUNTER — Inpatient Hospital Stay (HOSPITAL_COMMUNITY): Payer: Medicare Other

## 2019-10-08 LAB — RENAL FUNCTION PANEL
Albumin: 2.7 g/dL — ABNORMAL LOW (ref 3.5–5.0)
Anion gap: 14 (ref 5–15)
BUN: 57 mg/dL — ABNORMAL HIGH (ref 8–23)
CO2: 24 mmol/L (ref 22–32)
Calcium: 9.2 mg/dL (ref 8.9–10.3)
Chloride: 95 mmol/L — ABNORMAL LOW (ref 98–111)
Creatinine, Ser: 3.97 mg/dL — ABNORMAL HIGH (ref 0.61–1.24)
GFR calc Af Amer: 16 mL/min — ABNORMAL LOW (ref >60)
GFR calc non Af Amer: 14 mL/min — ABNORMAL LOW (ref >60)
Glucose, Bld: 152 mg/dL — ABNORMAL HIGH (ref 70–99)
Phosphorus: 4.5 mg/dL (ref 2.5–4.6)
Potassium: 3.9 mmol/L (ref 3.5–5.1)
Sodium: 133 mmol/L — ABNORMAL LOW (ref 135–145)

## 2019-10-08 LAB — GLUCOSE, CAPILLARY
Glucose-Capillary: 134 mg/dL — ABNORMAL HIGH (ref 70–99)
Glucose-Capillary: 152 mg/dL — ABNORMAL HIGH (ref 70–99)
Glucose-Capillary: 156 mg/dL — ABNORMAL HIGH (ref 70–99)
Glucose-Capillary: 183 mg/dL — ABNORMAL HIGH (ref 70–99)
Glucose-Capillary: 191 mg/dL — ABNORMAL HIGH (ref 70–99)

## 2019-10-08 LAB — MAGNESIUM: Magnesium: 2.3 mg/dL (ref 1.7–2.4)

## 2019-10-08 NOTE — Progress Notes (Signed)
[facility] NEPHROLOGY PROGRESS NOTE  Assessment/ Plan: Pt is a 74 y.o. yo male is status post CABG and had a cardiac arrest due to tamponade, developed AKI requiring dialysis.  Previously the creatinine level was 1.22 in 06/2019.  #Acute kidney injury following cardiac cath, CABG, cardiogenic shock and cardiac arrest.  Started CRRT from 4/29-5/12 to manage volume.  He is now tolerating intermittent hemodialysis.   Status post right IJ TDC by IR on 5/25. No permanent access because of AKI. He had dialysis on 6/12 with 1.7 L UF, tolerated well.  Plan for next HD on 6/15 as TTS schedule. Arrangement for outpatient HD for AKI ongoing, discussed with renal navigator.  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  # Postoperative acute hypoxic respiratory failure on h/o COPD-extubated and now the tracheostomy tube is capped.  # Anemia of critical illness: Iron saturation 14% therefore received IV iron with dialysis.  Continue ESA.  Transfusion as needed.  #CKD-MBD: Continue Renvela dose.  Phosphorus at goal  # A fib per primary.  #Protein calorie malnutrition currently status post feeding tube placement.  Per primary team.  Subjective: Seen and examined at bedside.  Reports generalized weakness and overall not feeling well.  Denies nausea, vomiting, chest pain, shortness of breath. Objective Vital signs in last 24 hours: Vitals:   10/07/19 2040 10/07/19 2345 10/08/19 0439 10/08/19 0540  BP:    (!) 155/89  Pulse:      Resp:      Temp: 98.4 F (36.9 C) 98.4 F (36.9 C) 98.6 F (37 C)   TempSrc: Oral Oral Oral   SpO2:      Weight:      Height:       Weight change:   Intake/Output Summary (Last 24 hours) at 10/08/2019 0852 Last data filed at  10/07/2019 1705 Gross per 24 hour  Intake --  Output 1722 ml  Net -1722 ml       Labs: Basic Metabolic Panel: Recent Labs  Lab 10/06/19 0315 10/07/19 0301 10/08/19 0248  NA 133* 134* 133*  K 3.5 3.9 3.9  CL 96* 94* 95*  CO2 24 23 24   GLUCOSE 170* 165* 152*  BUN 62* 98* 57*  CREATININE 4.05* 5.46* 3.97*  CALCIUM 9.4 9.1 9.2  PHOS 6.0* 7.5* 4.5   Liver Function Tests: Recent Labs  Lab 10/06/19 0315 10/07/19 0301 10/08/19 0248  ALBUMIN 2.7* 2.6* 2.7*   No results for input(s): LIPASE, AMYLASE in the last 168 hours. No results for input(s): AMMONIA in the last 168 hours. CBC: Recent Labs  Lab 10/02/19 0149 10/02/19 0149 10/03/19 0759 10/06/19 0316 10/07/19 1335  WBC 17.9*   < > 17.6* 17.1* 18.1*  HGB 7.5*   < > 7.4* 11.1* 9.7*  HCT 24.1*   < > 23.7* 35.4* 31.2*  MCV 99.2  --  100.0 99.7 100.3*  PLT 430*   < > 500* 479* 472*   < > = values in this interval not displayed.   Cardiac Enzymes: No results for input(s): CKTOTAL, CKMB, CKMBINDEX, TROPONINI in the last 168 hours. CBG: Recent Labs  Lab 10/07/19 0752 10/07/19 1131 10/07/19 2020 10/08/19 0002 10/08/19 0404  GLUCAP 158* 203* 209* 152* 183*    Iron Studies:  No results for input(s): IRON,  TIBC, TRANSFERRIN, FERRITIN in the last 72 hours. Studies/Results: No results found.  Medications: Infusions: . sodium chloride Stopped (09/14/19 0802)  . feeding supplement (NEPRO CARB STEADY) 1,000 mL (10/06/19 2313)  . ferric gluconate (FERRLECIT/NULECIT) IV 125 mg (10/07/19 1637)  . lactated ringers 20 mL/hr at 08/20/19 1200    Scheduled Medications: .  stroke: mapping our early stages of recovery book   Does not apply Once  . sodium chloride   Intravenous Once  . amiodarone  200 mg Per Tube Daily  . arformoterol  15 mcg Nebulization BID  . aspirin  81 mg Per Tube Daily  . atorvastatin  40 mg Per Tube Daily  . bisacodyl  10 mg Rectal Daily  . chlorhexidine  15 mL Mouth Rinse BID  .  Chlorhexidine Gluconate Cloth  6 each Topical Q0600  . clonazePAM  0.5 mg Per Tube BID  . darbepoetin (ARANESP) injection - DIALYSIS  100 mcg Intravenous Q Tue-HD  . feeding supplement (PRO-STAT SUGAR FREE 64)  60 mL Per Tube TID  . insulin aspart  0-15 Units Subcutaneous Q4H  . insulin detemir  12 Units Subcutaneous BID  . mouth rinse  15 mL Mouth Rinse q12n4p  . midodrine  5 mg Per Tube TID WC  . multivitamin  1 tablet Per Tube QHS  . nystatin  5 mL Oral QID  . pantoprazole sodium  40 mg Per Tube Daily  . revefenacin  175 mcg Nebulization Daily  . sertraline  25 mg Per Tube Daily  . sevelamer carbonate  1.6 g Per Tube TID WC  . trospium  20 mg Per Tube BID    have reviewed scheduled and prn medications.  Physical Exam: General:NAD, not in distress, lying on bed comfortable Heart:RRR, s1s2 nl no rubs Lungs: Clear bilateral, no wheezing Abdomen:soft, Non-tender, non-distended, gastric feeding tube Extremities:No edema Dialysis Access: Right IJ TDC, clean site with no evidence of bleeding. Geena Weinhold Prasad Jahel Wavra 10/08/2019,8:52 AM  LOS: 60 days  Pager: [phone]

## 2019-10-08 NOTE — Progress Notes (Signed)
[address]       [employer] [address]             [phone]      24 Days Post-Op Procedure(s) (LRB): EVACUATION OF HEMATOMA (N/A) Subjective: C/o feeling "sore" all over, also nauseated  Objective: Vital signs in last 24 hours: Temp:  [97.6 F (36.4 C)-98.6 F (37 C)] 98.6 F (37 C) (06/13 0439) Pulse Rate:  [102-116] 115 (06/12 1810) Cardiac Rhythm: Sinus tachycardia (06/13 0706) Resp:  [17-23] 17 (06/12 1810) BP: (99-159)/(52-89) 155/89 (06/13 0540) SpO2:  [98 %-100 %] 98 % (06/13 0854)  Hemodynamic parameters for last 24 hours:    Intake/Output from previous day: 06/12 0701 - 06/13 0700 In: -  Out: 1722  Intake/Output this shift: No intake/output data recorded.  General appearance: alert, cooperative, fatigued and weak Heart: regular rate and rhythm Lungs: dim in lower fields Abdomen: benign Extremities: some ankle edema Wound: incis stable without signs of infection  Lab Results: Recent Labs    10/06/19 0316 10/07/19 1335  WBC 17.1* 18.1*  HGB 11.1* 9.7*  HCT 35.4* 31.2*  PLT 479* 472*   BMET:  Recent Labs    10/07/19 0301 10/08/19 0248  NA 134* 133*  K 3.9 3.9  CL 94* 95*  CO2 23 24  GLUCOSE 165* 152*  BUN 98* 57*  CREATININE 5.46* 3.97*  CALCIUM 9.1 9.2    PT/INR: No results for input(s): LABPROT, INR in the last 72 hours. ABG    Component Value Date/Time   PHART 7.481 (H) 09/07/2019 0029   HCO3 23.3 09/07/2019 0029   TCO2 24 09/07/2019 0029   ACIDBASEDEF 5.0 (H) 08/18/2019 2341   O2SAT 99.0 09/07/2019 0029   CBG (last 3)  Recent Labs    10/08/19 0002 10/08/19 0404 10/08/19 0802  GLUCAP 152* 183* 156*    Meds Scheduled Meds: .  stroke: mapping our early stages of recovery book   Does not apply Once  . sodium chloride   Intravenous Once  . amiodarone  200 mg Per Tube Daily  . arformoterol  15 mcg Nebulization BID  . aspirin  81 mg Per Tube Daily  . atorvastatin  40 mg Per Tube Daily  . bisacodyl   10 mg Rectal Daily  . chlorhexidine  15 mL Mouth Rinse BID  . Chlorhexidine Gluconate Cloth  6 each Topical Q0600  . clonazePAM  0.5 mg Per Tube BID  . darbepoetin (ARANESP) injection - DIALYSIS  100 mcg Intravenous Q Tue-HD  . feeding supplement (PRO-STAT SUGAR FREE 64)  60 mL Per Tube TID  . insulin aspart  0-15 Units Subcutaneous Q4H  . insulin detemir  12 Units Subcutaneous BID  . mouth rinse  15 mL Mouth Rinse q12n4p  . midodrine  5 mg Per Tube TID WC  . multivitamin  1 tablet Per Tube QHS  . nystatin  5 mL Oral QID  . pantoprazole sodium  40 mg Per Tube Daily  . revefenacin  175 mcg Nebulization Daily  . sertraline  25 mg Per Tube Daily  . sevelamer carbonate  1.6 g Per Tube TID WC  . trospium  20 mg Per Tube BID   Continuous Infusions: . sodium chloride Stopped (09/14/19 0802)  . feeding supplement (NEPRO CARB STEADY) 1,000 mL (10/06/19 2313)  . ferric gluconate (FERRLECIT/NULECIT) IV 125 mg (10/07/19 1637)  . lactated ringers 20 mL/hr at 08/20/19 1200   PRN Meds:.acetaminophen (TYLENOL) oral liquid 160 mg/5  mL, albuterol, dextrose, Gerhardt's butt cream, guaiFENesin, hydrALAZINE, HYDROcodone-acetaminophen, hydrocortisone cream, HYDROmorphone (DILAUDID) injection, HYDROmorphone (DILAUDID) injection, ondansetron (ZOFRAN) IV, ondansetron (ZOFRAN) IV, oxyCODONE, phenol, Resource ThickenUp Clear, sennosides, sodium chloride flush  Xrays No results found.  Assessment/Plan: S/P Procedure(s) (LRB): EVACUATION OF HEMATOMA (N/A)  1 stable overall, primary issue is ongoing rehab needs, SNF placement in progress 2 tol diet/TF's , some nausea 3 afeb/VSS, sats good on RA 4 renal management/HD oer nephrology 5 will recheck CXR tomorrow to eval pleural effus if any progression   LOS: 60 days    John Giovanni PA-C Pager [phone] 10/08/2019

## 2019-10-09 LAB — RENAL FUNCTION PANEL
Albumin: 2.7 g/dL — ABNORMAL LOW (ref 3.5–5.0)
Anion gap: 17 — ABNORMAL HIGH (ref 5–15)
BUN: 103 mg/dL — ABNORMAL HIGH (ref 8–23)
CO2: 22 mmol/L (ref 22–32)
Calcium: 9.5 mg/dL (ref 8.9–10.3)
Chloride: 88 mmol/L — ABNORMAL LOW (ref 98–111)
Creatinine, Ser: 5.88 mg/dL — ABNORMAL HIGH (ref 0.61–1.24)
GFR calc Af Amer: 10 mL/min — ABNORMAL LOW (ref >60)
GFR calc non Af Amer: 9 mL/min — ABNORMAL LOW (ref >60)
Glucose, Bld: 180 mg/dL — ABNORMAL HIGH (ref 70–99)
Phosphorus: 5.3 mg/dL — ABNORMAL HIGH (ref 2.5–4.6)
Potassium: 4 mmol/L (ref 3.5–5.1)
Sodium: 127 mmol/L — ABNORMAL LOW (ref 135–145)

## 2019-10-09 LAB — MAGNESIUM: Magnesium: 2.5 mg/dL — ABNORMAL HIGH (ref 1.7–2.4)

## 2019-10-09 LAB — GLUCOSE, CAPILLARY
Glucose-Capillary: 116 mg/dL — ABNORMAL HIGH (ref 70–99)
Glucose-Capillary: 134 mg/dL — ABNORMAL HIGH (ref 70–99)
Glucose-Capillary: 167 mg/dL — ABNORMAL HIGH (ref 70–99)
Glucose-Capillary: 202 mg/dL — ABNORMAL HIGH (ref 70–99)
Glucose-Capillary: 202 mg/dL — ABNORMAL HIGH (ref 70–99)

## 2019-10-09 MED ORDER — NEPRO/CARBSTEADY PO LIQD: 720.0000 mL | ORAL | Status: DC

## 2019-10-09 MED ORDER — CHLORHEXIDINE GLUCONATE CLOTH 2 % EX PADS
6.0000 | MEDICATED_PAD | Freq: Every day | CUTANEOUS | Status: DC
  Administered 2019-10-10 – 2019-10-11 (×2): 6 via TOPICAL

## 2019-10-09 MED ORDER — NEPRO/CARBSTEADY PO LIQD
720.0000 mL | ORAL | Status: DC
  Administered 2019-10-10 – 2019-10-13 (×4): 720 mL
  Filled 2019-10-09 (×4): qty 948

## 2019-10-09 MED ORDER — PRO-STAT SUGAR FREE PO LIQD
60.0000 mL | Freq: Two times a day (BID) | ORAL | Status: DC
  Administered 2019-10-09 – 2019-10-18 (×13): 60 mL
  Filled 2019-10-09 (×13): qty 60

## 2019-10-09 NOTE — Progress Notes (Signed)
25 Days Post-Op  Subjective: Doing well today, wife at bedside.   Reports no pain with palpation of chest/sternal incision.  Reports he has been itchy.  Objective: Vital signs in last 24 hours: Temp:  [97.3 F (36.3 C)-98.4 F (36.9 C)] 98.4 F (36.9 C) (06/14 0800) Pulse Rate:  [100-106] 100 (06/14 0814) Resp:  [18-20] 19 (06/14 0814) BP: (129-141)/(71-76) 141/76 (06/14 0800) SpO2:  [98 %-100 %] 99 % (06/14 0800) Last BM Date: 10/08/19  Intake/Output from previous day: No intake/output data recorded. Intake/Output this shift: No intake/output data recorded.  General appearance: alert, cooperative and no distress, sitting up in bed Head: Normocephalic, without obvious abnormality, atraumatic Neck: Trach dressing in place Chest wall: No TTP, no erythema, no fluid collections, no drainage. No dehiscence of sternal incision noted.  Extremities: SCDs in place b/l LE, no swelling noted.  Dehiscence of L forearm incision. wound bed with fibrinous exudate, no foul odor or periwound erythema noted. No fluid collections or abscess noted. Incisional dehiscence is ~ 3.5 cm x 0.7 cm. No purulence noted. No tenderness. - nursing aware. Neurologic: Grossly normal   Lab Results:  CBC Latest Ref Rng & Units 10/07/2019 10/06/2019 10/03/2019  WBC 4.0 - 10.5 K/uL 18.1(H) 17.1(H) 17.6(H)  Hemoglobin 13.0 - 17.0 g/dL 9.7(L) 11.1(L) 7.4(L)  Hematocrit 39 - 52 % 31.2(L) 35.4(L) 23.7(L)  Platelets 150 - 400 K/uL 472(H) 479(H) 500(H)    BMET Recent Labs    10/08/19 0248 10/09/19 1054  NA 133* 127*  K 3.9 4.0  CL 95* 88*  CO2 24 22  GLUCOSE 152* 180*  BUN 57* 103*  CREATININE 3.97* 5.88*  CALCIUM 9.2 9.5   PT/INR No results for input(s): LABPROT, INR in the last 72 hours. ABG No results for input(s): PHART, HCO3 in the last 72 hours.  Invalid input(s): PCO2, PO2  Studies/Results: DG Chest 2 View  Result Date: 10/08/2019 CLINICAL DATA:  Chest pain EXAM: CHEST - 2 VIEW COMPARISON:   10/05/2019 FINDINGS: Cardiac shadow remains enlarged. Postsurgical changes are noted. Tracheostomy tube is been removed in the interval. Left jugular dialysis catheter is again noted and stable. The right lung is well aerated without focal infiltrate. Minimal left pleural effusion is noted stable from the prior study. IMPRESSION: Stable small left pleural effusion. No new acute abnormality noted. Electronically Signed   By: Inez Catalina M.D.   On: 10/08/2019 15:27    Anti-infectives: Anti-infectives (From admission, onward)   Start     Dose/Rate Route Frequency Ordered Stop   10/04/19 0800  ceFAZolin (ANCEF) IVPB 2g/100 mL premix        2 g 200 mL/hr over 30 Minutes Intravenous To Radiology 10/03/19 1045 10/04/19 1545   09/19/19 1417  ceFAZolin (ANCEF) 2-4 GM/100ML-% IVPB       Note to Pharmacy: Rudene Re   : cabinet override      09/19/19 1417 09/20/19 0229   09/19/19 1330  ceFAZolin (ANCEF) IVPB 1 g/50 mL premix  Status:  Discontinued        1 g 100 mL/hr over 30 Minutes Intravenous Once 09/19/19 1317 09/19/19 1329   09/19/19 1330  ceFAZolin (ANCEF) IVPB 2g/100 mL premix        2 g 200 mL/hr over 30 Minutes Intravenous  Once 09/19/19 1327 09/19/19 1448   09/14/19 0915  vancomycin (VANCOCIN) IVPB 1000 mg/200 mL premix        1,000 mg 200 mL/hr over 60 Minutes Intravenous To Surgery 09/14/19 0907 09/15/19 0915  09/07/19 2000  vancomycin (VANCOREADY) IVPB 750 mg/150 mL        750 mg 150 mL/hr over 60 Minutes Intravenous  Once 09/07/19 1510 09/07/19 2245   09/06/19 2200  meropenem (MERREM) 500 mg in sodium chloride 0.9 % 100 mL IVPB        500 mg 200 mL/hr over 30 Minutes Intravenous Every 24 hours 09/06/19 1542 09/09/19 0015   09/06/19 2000  fluconazole (DIFLUCAN) IVPB 400 mg        400 mg 100 mL/hr over 120 Minutes Intravenous Every 24 hours 09/06/19 1542 09/08/19 2240   09/06/19 1800  vancomycin (VANCOREADY) IVPB 750 mg/150 mL        750 mg 150 mL/hr over 60 Minutes Intravenous   Once 09/06/19 1542 09/07/19 0022   09/06/19 1540  vancomycin variable dose per unstable renal function (pharmacist dosing)         Does not apply See admin instructions 09/06/19 1542 09/08/19 2359   09/06/19 1100  ceFAZolin (ANCEF) IVPB 2g/100 mL premix  Status:  Discontinued        2 g 200 mL/hr over 30 Minutes Intravenous 30 min pre-op 09/05/19 1717 09/06/19 1644   09/06/19 0600  ceFAZolin (ANCEF) IVPB 2g/100 mL premix  Status:  Discontinued        2 g 200 mL/hr over 30 Minutes Intravenous To Short Stay 09/06/19 0546 09/06/19 1644   09/02/19 2200  meropenem (MERREM) 1 g in sodium chloride 0.9 % 100 mL IVPB  Status:  Discontinued        1 g 200 mL/hr over 30 Minutes Intravenous Every 8 hours 09/02/19 1011 09/06/19 1542   09/02/19 2000  fluconazole (DIFLUCAN) IVPB 800 mg  Status:  Discontinued        800 mg 100 mL/hr over 240 Minutes Intravenous Every 24 hours 09/02/19 1011 09/06/19 1542   09/02/19 2000  fluconazole (DIFLUCAN) IVPB 800 mg  Status:  Discontinued        800 mg 200 mL/hr over 120 Minutes Intravenous Every 24 hours 09/02/19 1329 09/02/19 1330   09/02/19 1800  vancomycin (VANCOCIN) IVPB 1000 mg/200 mL premix  Status:  Discontinued        1,000 mg 200 mL/hr over 60 Minutes Intravenous Every 24 hours 09/02/19 1011 09/06/19 1542   09/02/19 0900  meropenem (MERREM) 1 g in sodium chloride 0.9 % 100 mL IVPB  Status:  Discontinued        1 g 200 mL/hr over 30 Minutes Intravenous Every 24 hours 09/01/19 1333 09/02/19 1011   09/01/19 1700  fluconazole (DIFLUCAN) IVPB 800 mg        800 mg 100 mL/hr over 240 Minutes Intravenous  Once 09/01/19 1645 09/01/19 2057   09/01/19 1630  fluconazole (DIFLUCAN) IVPB 400 mg  Status:  Discontinued        400 mg 100 mL/hr over 120 Minutes Intravenous  Once 09/01/19 1629 09/01/19 1645   09/01/19 1330  vancomycin variable dose per unstable renal function (pharmacist dosing)  Status:  Discontinued         Does not apply See admin instructions 09/01/19  1333 09/02/19 1325   09/01/19 0730  meropenem (MERREM) 1 g in sodium chloride 0.9 % 100 mL IVPB  Status:  Discontinued        1 g 200 mL/hr over 30 Minutes Intravenous Every 8 hours 09/01/19 0729 09/01/19 1327   08/31/19 2200  vancomycin (VANCOCIN) IVPB 1000 mg/200 mL premix  Status:  Discontinued  1,000 mg 200 mL/hr over 60 Minutes Intravenous Every 24 hours 08/30/19 2235 09/01/19 1330   08/25/19 0800  vancomycin (VANCOREADY) IVPB 1250 mg/250 mL  Status:  Discontinued        1,250 mg 166.7 mL/hr over 90 Minutes Intravenous Every 24 hours 08/24/19 2121 08/24/19 2128   08/25/19 0100  vancomycin (VANCOREADY) IVPB 1250 mg/250 mL  Status:  Discontinued        1,250 mg 166.7 mL/hr over 90 Minutes Intravenous Every 24 hours 08/24/19 2128 08/30/19 2235   08/24/19 2300  ceFEPIme (MAXIPIME) 2 g in sodium chloride 0.9 % 100 mL IVPB  Status:  Discontinued        2 g 200 mL/hr over 30 Minutes Intravenous Every 12 hours 08/24/19 2150 09/01/19 0729   08/24/19 1338  vancomycin (VANCOCIN) 1,000 mg in sodium chloride 0.9 % 1,000 mL irrigation  Status:  Discontinued          As needed 08/24/19 1338 08/24/19 1508   08/21/19 0000  ceFEPIme (MAXIPIME) 1 g in sodium chloride 0.9 % 100 mL IVPB  Status:  Discontinued        1 g 200 mL/hr over 30 Minutes Intravenous Every 24 hours 08/20/19 0954 08/24/19 2150   08/20/19 1200  vancomycin (VANCOCIN) IVPB 1000 mg/200 mL premix        1,000 mg 200 mL/hr over 60 Minutes Intravenous  Once 08/20/19 0954 08/21/19 0011   08/19/19 2129  vancomycin variable dose per unstable renal function (pharmacist dosing)  Status:  Discontinued         Does not apply See admin instructions 08/19/19 2129 08/24/19 2128   08/19/19 0000  ceFEPIme (MAXIPIME) 2 g in sodium chloride 0.9 % 100 mL IVPB  Status:  Discontinued        2 g 200 mL/hr over 30 Minutes Intravenous Every 24 hours 08/18/19 2351 08/20/19 0954   08/19/19 0000  vancomycin (VANCOREADY) IVPB 2000 mg/400 mL        2,000  mg 200 mL/hr over 120 Minutes Intravenous  Once 08/18/19 2351 08/19/19 0354   08/16/19 1731  vancomycin (VANCOCIN) powder  Status:  Discontinued          As needed 08/16/19 1732 08/16/19 1808   08/15/19 2045  vancomycin (VANCOCIN) IVPB 1000 mg/200 mL premix        1,000 mg 200 mL/hr over 60 Minutes Intravenous  Once 08/15/19 1402 08/15/19 2209   08/15/19 1645  cefUROXime (ZINACEF) 1.5 g in sodium chloride 0.9 % 100 mL IVPB        1.5 g 200 mL/hr over 30 Minutes Intravenous Every 12 hours 08/15/19 1402 08/17/19 0700   08/15/19 0958  vancomycin (VANCOCIN) powder  Status:  Discontinued          As needed 08/15/19 1000 08/15/19 1402   08/15/19 0400  vancomycin (VANCOREADY) IVPB 1500 mg/300 mL        1,500 mg 150 mL/hr over 120 Minutes Intravenous To Surgery 08/14/19 1121 08/15/19 0830   08/15/19 0400  cefUROXime (ZINACEF) 1.5 g in sodium chloride 0.9 % 100 mL IVPB        1.5 g 200 mL/hr over 30 Minutes Intravenous To Surgery 08/14/19 1121 08/15/19 0812   08/15/19 0400  cefUROXime (ZINACEF) 750 mg in sodium chloride 0.9 % 100 mL IVPB        750 mg 200 mL/hr over 30 Minutes Intravenous To Surgery 08/14/19 1121 08/15/19 1300      Assessment/Plan: s/p Procedure(s): EVACUATION OF  HEMATOMA on 09/14/2019. Right pectoralis muscle advancement flap, layered closure of sternal wound and application of ACell on 09/06/2019.  Sternal incision intact - doing well. Pressure wound - nursing staff offloading, air mattress bed.    LOS: 61 days    Charlies Constable, PA-C 10/09/2019

## 2019-10-09 NOTE — Progress Notes (Signed)
[facility] ROUNDING NOTE   Subjective:   This is a 74 year old gentleman status post CABG status post cardiac arrest due to tamponade developed acute kidney injury receiving dialysis.  Previous creatinine was 1.17 July 2019.  Started on CRRT 08/24/2019 to 09/06/2019 to manage volume.  Now tolerating intermittent hemodialysis through a right IJ TDC placed by interventional radiology 09/19/2019.  Last dialysis 10/07/2019.  Urine output appears to be minimal with 175 cc recorded on 10/06/2019  Blood pressure 100 2975 pulse 72 temperature 97.5 O2 sats 99% room air  Sodium 133 potassium 3.9 chloride 95 CO2 24 BUN 57 creatinine 3.97 glucose 152 calcium 9.2 albumin 2.7 hemoglobin 9.7 WBC 18.1 platelets 472  Aspirin 81 mg daily atorvastatin 40 mg daily, darbepoetin 100 mcg every Tuesday last administered 10/03/2019, insulin sliding scale, Levemir 12 units twice daily, midodrine 5 mg 3 times daily, Protonix 40 mg daily, Renvela 1.6 g 3 times daily, Sanctura 20 mg twice daily  Objective:  Vital signs in last 24 hours:  Temp:  [97.3 F (36.3 C)-98.2 F (36.8 C)] 97.9 F (36.6 C) (06/14 0436) Pulse Rate:  [102-107] 102 (06/14 0436) Resp:  [18-23] 18 (06/14 0436) BP: (118-136)/(60-75) 129/75 (06/14 0436) SpO2:  [98 %-100 %] 100 % (06/14 0436)  Weight change:  Filed Weights   10/06/19 0420 10/07/19 0414  Weight: 81.1 kg 83 kg    Intake/Output: I/O last 3 completed shifts: In: -  Out: 1722 [Other:1722]   Intake/Output this shift:  No intake/output data recorded.  General:NAD, not in distress, lying on bed comfortable Heart:RRR, s1s2 nl no rubs Lungs: Clear bilateral, no wheezing Abdomen:soft, Non-tender, non-distended, gastric feeding tube Extremities:No edema Dialysis Access: Right IJ TDC, clean site with no evidence of bleeding.   Basic Metabolic Panel: Recent Labs  Lab 10/04/19 0232 10/04/19 0232 10/05/19 0248 10/05/19 0248 10/06/19 0315 10/07/19 0301  10/08/19 0248  NA 136  --  135  --  133* 134* 133*  K 4.1  --  4.5  --  3.5 3.9 3.9  CL 97*  --  96*  --  96* 94* 95*  CO2 23  --  22  --  24 23 24   GLUCOSE 111*  --  137*  --  170* 165* 152*  BUN 76*  --  113*  --  62* 98* 57*  CREATININE 4.26*  --  6.01*  --  4.05* 5.46* 3.97*  CALCIUM 9.0   < > 9.4   < > 9.4 9.1 9.2  MG 2.6*  --  2.6*  --  2.4 2.6* 2.3  PHOS 5.2*  --  9.0*  --  6.0* 7.5* 4.5   < > = values in this interval not displayed.    Liver Function Tests: Recent Labs  Lab 10/04/19 0232 10/05/19 0248 10/06/19 0315 10/07/19 0301 10/08/19 0248  ALBUMIN 2.6* 2.6* 2.7* 2.6* 2.7*   No results for input(s): LIPASE, AMYLASE in the last 168 hours. No results for input(s): AMMONIA in the last 168 hours.  CBC: Recent Labs  Lab 10/03/19 0759 10/06/19 0316 10/07/19 1335  WBC 17.6* 17.1* 18.1*  HGB 7.4* 11.1* 9.7*  HCT 23.7* 35.4* 31.2*  MCV 100.0 99.7 100.3*  PLT 500* 479* 472*    Cardiac Enzymes: No results for input(s): CKTOTAL, CKMB, CKMBINDEX, TROPONINI in the last 168 hours.  BNP: Invalid input(s): POCBNP  CBG: Recent Labs  Lab 10/08/19 0802 10/08/19 1614 10/08/19 2158 10/09/19 0036 10/09/19 0435  GLUCAP 156* 134* 191* 167* 202*  Microbiology: Results for orders placed or performed during the hospital encounter of 08/09/19  Respiratory Panel by RT PCR (Flu A&B, Covid) - Nasopharyngeal Swab     Status: None   Collection Time: 08/09/19  3:31 PM   Specimen: Nasopharyngeal Swab  Result Value Ref Range Status   SARS Coronavirus 2 by RT PCR NEGATIVE NEGATIVE Final    Comment: (NOTE) SARS-CoV-2 target nucleic acids are NOT DETECTED. The SARS-CoV-2 RNA is generally detectable in upper respiratoy specimens during the acute phase of infection. The lowest concentration of SARS-CoV-2 viral copies this assay can detect is 131 copies/mL. A negative result does not preclude SARS-Cov-2 infection and should not be used as the sole basis for treatment  or other patient management decisions. A negative result may occur with  improper specimen collection/handling, submission of specimen other than nasopharyngeal swab, presence of viral mutation(s) within the areas targeted by this assay, and inadequate number of viral copies (<131 copies/mL). A negative result must be combined with clinical observations, patient history, and epidemiological information. The expected result is Negative. Fact Sheet for Patients:  PinkCheek.be Fact Sheet for Healthcare Providers:  GravelBags.it This test is not yet ap proved or cleared by the [place] FDA and  has been authorized for detection and/or diagnosis of SARS-CoV-2 by FDA under an Emergency Use Authorization (EUA). This EUA will remain  in effect (meaning this test can be used) for the duration of the COVID-19 declaration under Section 564(b)(1) of the Act, 21 U.S.C. section 360bbb-3(b)(1), unless the authorization is terminated or revoked sooner.    Influenza A by PCR NEGATIVE NEGATIVE Final   Influenza B by PCR NEGATIVE NEGATIVE Final    Comment: (NOTE) The Xpert Xpress SARS-CoV-2/FLU/RSV assay is intended as an aid in  the diagnosis of influenza from Nasopharyngeal swab specimens and  should not be used as a sole basis for treatment. Nasal washings and  aspirates are unacceptable for Xpert Xpress SARS-CoV-2/FLU/RSV  testing. Fact Sheet for Patients: PinkCheek.be Fact Sheet for Healthcare Providers: GravelBags.it This test is not yet approved or cleared by the [place] FDA and  has been authorized for detection and/or diagnosis of SARS-CoV-2 by  FDA under an Emergency Use Authorization (EUA). This EUA will remain  in effect (meaning this test can be used) for the duration of the  Covid-19 declaration under Section 564(b)(1) of the Act, 21  U.S.C. section  360bbb-3(b)(1), unless the authorization is  terminated or revoked. Performed at [facility], [place] [address] [place], [place] [address]   Surgical pcr screen     Status: None   Collection Time: 08/14/19 10:20 PM   Specimen: Nasal Mucosa; Nasal Swab  Result Value Ref Range Status   MRSA, PCR NEGATIVE NEGATIVE Final   Staphylococcus aureus NEGATIVE NEGATIVE Final    Comment: (NOTE) The Xpert SA Assay (FDA approved for NASAL specimens in patients 3 years of age and older), is one component of a comprehensive surveillance program. It is not intended to diagnose infection nor to guide or monitor treatment. Performed at [facility], [place] [address], [place], [place] [address]   Culture, blood (routine x 2)     Status: None   Collection Time: 08/17/19 11:32 AM   Specimen: BLOOD RIGHT HAND  Result Value Ref Range Status   Specimen Description BLOOD RIGHT HAND  Final   Special Requests   Final    BOTTLES DRAWN AEROBIC AND ANAEROBIC Blood Culture adequate volume   Culture  Final    NO GROWTH 5 DAYS Performed at [facility], [place] [address], [place], [place] [address]    Report Status 08/22/2019 FINAL  Final  Culture, blood (routine x 2)     Status: None   Collection Time: 08/17/19 11:39 AM   Specimen: BLOOD RIGHT HAND  Result Value Ref Range Status   Specimen Description BLOOD RIGHT HAND  Final   Special Requests   Final    BOTTLES DRAWN AEROBIC AND ANAEROBIC Blood Culture results may not be optimal due to an inadequate volume of blood received in culture bottles   Culture   Final    NO GROWTH 5 DAYS Performed at [facility], [place] [address], [place], [place] [address]    Report Status 08/22/2019 FINAL  Final  Culture, respiratory (non-expectorated)     Status: None   Collection Time: 08/27/19  7:42 AM   Specimen: Tracheal Aspirate; Respiratory  Result Value Ref Range Status   Specimen Description TRACHEAL ASPIRATE  Final   Special Requests  Immunocompromised  Final   Gram Stain   Final    RARE WBC PRESENT, PREDOMINANTLY PMN RARE GRAM POSITIVE RODS    Culture   Final    FEW Consistent with normal respiratory flora. Performed at [facility], [place] [address] [place], [place] [address]    Report Status 08/29/2019 FINAL  Final  Culture, blood (routine x 2)     Status: None   Collection Time: 08/27/19 10:30 AM   Specimen: BLOOD RIGHT HAND  Result Value Ref Range Status   Specimen Description BLOOD RIGHT HAND  Final   Special Requests   Final    BOTTLES DRAWN AEROBIC AND ANAEROBIC Blood Culture adequate volume   Culture   Final    NO GROWTH 5 DAYS Performed at [facility], [place] [address] [place], [place] [address]    Report Status 09/01/2019 FINAL  Final  Culture, blood (routine x 2)     Status: None   Collection Time: 08/27/19 12:20 PM   Specimen: BLOOD RIGHT HAND  Result Value Ref Range Status   Specimen Description BLOOD RIGHT HAND  Final   Special Requests   Final    BOTTLES DRAWN AEROBIC ONLY Blood Culture results may not be optimal due to an inadequate volume of blood received in culture bottles   Culture   Final    NO GROWTH 5 DAYS Performed at [facility], [place] [address], [place], [place] [address]    Report Status 09/01/2019 FINAL  Final  Culture, blood (routine x 2)     Status: None   Collection Time: 08/31/19  8:50 AM   Specimen: BLOOD RIGHT HAND  Result Value Ref Range Status   Specimen Description BLOOD RIGHT HAND  Final   Special Requests   Final    BOTTLES DRAWN AEROBIC ONLY Blood Culture results may not be optimal due to an inadequate volume of blood received in culture bottles   Culture   Final    NO GROWTH 5 DAYS Performed at [facility], [place] [address] [place],  [address]    Report Status 09/05/2019 FINAL  Final  Culture, blood (routine x 2)     Status: None   Collection Time: 08/31/19  9:01 AM   Specimen: BLOOD LEFT HAND  Result Value Ref Range Status    Specimen Description BLOOD LEFT HAND  Final   Special Requests   Final  BOTTLES DRAWN AEROBIC ONLY Blood Culture results may not be optimal due to an inadequate volume of blood received in culture bottles   Culture   Final    NO GROWTH 5 DAYS Performed at [facility], [place] [address], [place], [place] [address]    Report Status 09/05/2019 FINAL  Final  Culture, respiratory (non-expectorated)     Status: None   Collection Time: 08/31/19  9:05 AM   Specimen: Tracheal Aspirate; Respiratory  Result Value Ref Range Status   Specimen Description TRACHEAL ASPIRATE  Final   Special Requests NONE  Final   Gram Stain   Final    NO WBC SEEN FEW GRAM NEGATIVE RODS RARE GRAM POSITIVE COCCI RARE GRAM POSITIVE RODS RARE GRAM VARIABLE ROD Performed at [facility], [place] [address], [place], [place] [address]    Culture RARE PSEUDOMONAS AERUGINOSA  Final   Report Status 09/02/2019 FINAL  Final   Organism ID, Bacteria PSEUDOMONAS AERUGINOSA  Final      Susceptibility   Pseudomonas aeruginosa - MIC*    CEFTAZIDIME <=1 SENSITIVE Sensitive     CIPROFLOXACIN <=0.25 SENSITIVE Sensitive     GENTAMICIN <=1 SENSITIVE Sensitive     IMIPENEM 2 SENSITIVE Sensitive     PIP/TAZO <=4 SENSITIVE Sensitive     CEFEPIME <=1 SENSITIVE Sensitive     * RARE PSEUDOMONAS AERUGINOSA  Aerobic Culture (superficial specimen)     Status: None   Collection Time: 09/04/19  8:22 AM   Specimen: Wound  Result Value Ref Range Status   Specimen Description WOUND  Final   Special Requests STERNAL WOUND SPEC A  Final   Gram Stain NO WBC SEEN NO ORGANISMS SEEN   Final   Culture   Final    NO GROWTH 2 DAYS Performed at [facility], [address] [place], [place] [address]    Report Status 09/06/2019 FINAL  Final  Culture, blood (routine x 2)     Status: None   Collection Time: 09/08/19  9:23 AM   Specimen: BLOOD LEFT HAND  Result Value Ref Range Status   Specimen Description BLOOD LEFT HAND  Final    Special Requests   Final    BOTTLES DRAWN AEROBIC ONLY Blood Culture adequate volume   Culture   Final    NO GROWTH 5 DAYS Performed at [facility], [place] [address] [place], [place] [address]    Report Status 09/13/2019 FINAL  Final  Culture, blood (routine x 2)     Status: None   Collection Time: 09/08/19  9:27 AM   Specimen: BLOOD RIGHT HAND  Result Value Ref Range Status   Specimen Description BLOOD RIGHT HAND  Final   Special Requests   Final    BOTTLES DRAWN AEROBIC ONLY Blood Culture adequate volume   Culture   Final    NO GROWTH 5 DAYS Performed at [facility], [place] [address] [place], [place] [address]    Report Status 09/13/2019 FINAL  Final  Culture, respiratory (non-expectorated)     Status: None   Collection Time: 09/08/19  3:17 PM   Specimen: Tracheal Aspirate; Respiratory  Result Value Ref Range Status   Specimen Description TRACHEAL ASPIRATE  Final   Special Requests NONE  Final   Gram Stain NO RBC SEEN RARE GRAM POSITIVE COCCI IN PAIRS   Final   Culture   Final    Consistent with normal respiratory flora. Performed at [facility], [address] [place], [place] [address]    Report Status 09/11/2019 FINAL  Final  Aerobic/Anaerobic Culture (surgical/deep wound)     Status: None   Collection Time: 09/14/19  9:17 AM   Specimen: Soft Tissue, Other  Result Value Ref Range Status   Specimen Description TISSUE  Final   Special Requests HEMATOMA FROM CHEST SPEC A  Final   Gram Stain   Final    FEW WBC PRESENT, PREDOMINANTLY PMN NO ORGANISMS SEEN    Culture   Final    No growth aerobically or anaerobically. Performed at [facility], [place] [address], [place], [place] [address]    Report Status 09/19/2019 FINAL  Final  Surgical PCR screen     Status: None   Collection Time: 09/23/19  2:48 PM   Specimen: Nasal Mucosa; Nasal Swab  Result Value Ref Range Status   MRSA, PCR NEGATIVE NEGATIVE Final   Staphylococcus aureus NEGATIVE  NEGATIVE Final    Comment: (NOTE) The Xpert SA Assay (FDA approved for NASAL specimens in patients 55 years of age and older), is one component of a comprehensive surveillance program. It is not intended to diagnose infection nor to guide or monitor treatment. Performed at [facility], [place] [address] [place], [place] [address]     Coagulation Studies: No results for input(s): LABPROT, INR in the last 72 hours.  Urinalysis: No results for input(s): COLORURINE, LABSPEC, PHURINE, GLUCOSEU, HGBUR, BILIRUBINUR, KETONESUR, PROTEINUR, UROBILINOGEN, NITRITE, LEUKOCYTESUR in the last 72 hours.  Invalid input(s): APPERANCEUR    Imaging: DG Chest 2 View  Result Date: 10/08/2019 CLINICAL DATA:  Chest pain EXAM: CHEST - 2 VIEW COMPARISON:  10/05/2019 FINDINGS: Cardiac shadow remains enlarged. Postsurgical changes are noted. Tracheostomy tube is been removed in the interval. Left jugular dialysis catheter is again noted and stable. The right lung is well aerated without focal infiltrate. Minimal left pleural effusion is noted stable from the prior study. IMPRESSION: Stable small left pleural effusion. No new acute abnormality noted. Electronically Signed   By: Inez Catalina M.D.   On: 10/08/2019 15:27     Medications:   . sodium chloride Stopped (09/14/19 0802)  . feeding supplement (NEPRO CARB STEADY) 1,000 mL (10/09/19 0007)  . ferric gluconate (FERRLECIT/NULECIT) IV 125 mg (10/07/19 1637)  . lactated ringers 20 mL/hr at 08/20/19 1200   .  stroke: mapping our early stages of recovery book   Does not apply Once  . sodium chloride   Intravenous Once  . amiodarone  200 mg Per Tube Daily  . arformoterol  15 mcg Nebulization BID  . aspirin  81 mg Per Tube Daily  . atorvastatin  40 mg Per Tube Daily  . bisacodyl  10 mg Rectal Daily  . chlorhexidine  15 mL Mouth Rinse BID  . Chlorhexidine Gluconate Cloth  6 each Topical Q0600  . clonazePAM  0.5 mg Per Tube BID  . darbepoetin (ARANESP)  injection - DIALYSIS  100 mcg Intravenous Q Tue-HD  . feeding supplement (PRO-STAT SUGAR FREE 64)  60 mL Per Tube TID  . insulin aspart  0-15 Units Subcutaneous Q4H  . insulin detemir  12 Units Subcutaneous BID  . mouth rinse  15 mL Mouth Rinse q12n4p  . midodrine  5 mg Per Tube TID WC  . multivitamin  1 tablet Per Tube QHS  . nystatin  5 mL Oral QID  . pantoprazole sodium  40 mg Per Tube Daily  . revefenacin  175  mcg Nebulization Daily  . sertraline  25 mg Per Tube Daily  . sevelamer carbonate  1.6 g Per Tube TID WC  . trospium  20 mg Per Tube BID   acetaminophen (TYLENOL) oral liquid 160 mg/5 mL, albuterol, dextrose, Gerhardt's butt cream, guaiFENesin, hydrALAZINE, HYDROcodone-acetaminophen, hydrocortisone cream, HYDROmorphone (DILAUDID) injection, HYDROmorphone (DILAUDID) injection, ondansetron (ZOFRAN) IV, ondansetron (ZOFRAN) IV, oxyCODONE, phenol, Resource ThickenUp Clear, sennosides, sodium chloride flush  Assessment/ Plan:   Acute kidney injury status post cardiac catheterization, CABG, cardiogenic shock and cardiac arrest.  Started CRRT 08/24/2019 until 09/06/2019 for volume control.  Now tolerating intermittent dialysis.  His last dialysis treatment was 10/07/2019 with 1.7 L ultrafiltration tolerated well continues to be anuric.  Watching for recovery.  Plan for dialysis 10/10/2019 TTS schedule.  Renal navigator involved with outpatient placement for hemodialysis.  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  Appears to be tolerating well with dialysis.  Midodrine for blood pressure support is ordered  Coronary artery disease status post CABG complicated by cardiac tamponade status post reopening chest urgently 08/18/2019 with wound VAC.  Most recently went back to the OR on 5/12 and 09/14/2019.  Continues on aspirin and atorvastatin  Atrial fibrillation per primary service  Last 2D echo  showed systolic heart failure 10/25/4101 ejection fraction of about 40 to 45%.   LOS: [place] @TODAY @6 :29 AM

## 2019-10-09 NOTE — TOC Progression Note (Signed)
Transition of Care [facility]) - Progression Note    Patient Details  Name: Christian [name] MRN: [id number] Date of Birth: April 07, 1946  [facility] CM/SW [place], [place] Phone Number: 10/09/2019, 12:20 PM  Clinical Narrative:    CSW contacted [facility] to inquire on dialysis transportation. CSW informed SNF does not transport for dialysis, but patient can be set up with [facility] to be transported from facility. CSW will continue to assist with discharge planning needs.  Expected Discharge Plan: [place]ve Barriers to Discharge: Continued Medical Work up  Expected Discharge Plan and Services Expected Discharge Plan: [place] arrangements for the past 2 months: Single Family Home                                       Social Determinants of Health (SDOH) Interventions    Readmission Risk Interventions No flowsheet data found.

## 2019-10-09 NOTE — Progress Notes (Signed)
Renal Navigator received call from patient's wife that she is at bedside and would like to meet with Navigator regarding patient's OP HD referral. Navigator met with patient and wife at bedside. Both were pleasant and welcoming and seemed very appreciative of the visit.  Renal Navigator explained that we are still monitoring for renal recovery at this time, but that Navigator has been asked to refer for OP HD as we are hopefully nearing discharge and patient is still requiring HD. Navigator explained that the OP HD clinic can continue to monitor for AKI recovery and that Navigator has referred patient to [facility] as this is the clinic closest to patient's home, should it be determined that patient is dialysis dependent/ESRD at some point. Patient and his wife stated understanding. They were not aware that this clinic existed and patient's wife stated plans to drive by this location after she leaves today. Navigator explained it as the [employer] clinic, being one of the smaller clinics in the area, with an excellent [place] manager.  Patient stated that as long as he only needs to have HD two times a week, he will be satisfied. Renal Navigator assures him that he will be set up with HD three times a week and that it is unlikely that the MD will order two times a week. Patient appears very easy going, but states HD is painful to his bottom because of sores. He states he is fine as long as the "butt cream" can be applied prior to HD. He reports that this doesn't always happen here. Navigator suggests that he and his wife ask about this as a prescription that follows him to rehab. He also complains of being cold during treatment. Navigator suggests that he take his own blankets to OP HD, so he can cover up, and normalized being cold as a symptom reported by HD patients. Navigator asked him to report this to the HD RNs as there are blankets here that can be given to him in the hospital while on treatment.   Patient and wife had some questions regarding SNF, which Navigator will defer to CSW. Navigator contacted CSW/C. Riddick to inform that wife is at bedside.  Navigator followed up with OP HD clinic/[facility] regarding referral. Patient's case is still under review and Navigator is hopeful to hear something later this afternoon.   Alphonzo Cruise, [place] Renal Navigator [phone]

## 2019-10-09 NOTE — Progress Notes (Signed)
[address]       [place] [address]             [phone]      25 Days Post-Op Procedure(s) (LRB): EVACUATION OF HEMATOMA (N/A) Subjective: Feels better today, slow but steady progress continues conts to tol current diet  Objective: Vital signs in last 24 hours: Temp:  [97.3 F (36.3 C)-98.4 F (36.9 C)] 98.4 F (36.9 C) (06/14 0800) Pulse Rate:  [100-106] 100 (06/14 0814) Cardiac Rhythm: Normal sinus rhythm (06/14 0718) Resp:  [18-20] 19 (06/14 0814) BP: (129-141)/(71-76) 141/76 (06/14 0800) SpO2:  [98 %-100 %] 99 % (06/14 0800)  Hemodynamic parameters for last 24 hours:    Intake/Output from previous day: No intake/output data recorded. Intake/Output this shift: No intake/output data recorded.  General appearance: alert, cooperative and no distress Heart: regular rate and rhythm Lungs: mildly dim left base Abdomen: benign Extremities: some ankle edema Wound: incis healing well  Lab Results: Recent Labs    10/07/19 1335  WBC 18.1*  HGB 9.7*  HCT 31.2*  PLT 472*   BMET:  Recent Labs    10/07/19 0301 10/08/19 0248  NA 134* 133*  K 3.9 3.9  CL 94* 95*  CO2 23 24  GLUCOSE 165* 152*  BUN 98* 57*  CREATININE 5.46* 3.97*  CALCIUM 9.1 9.2    PT/INR: No results for input(s): LABPROT, INR in the last 72 hours. ABG    Component Value Date/Time   PHART 7.481 (H) 09/07/2019 0029   HCO3 23.3 09/07/2019 0029   TCO2 24 09/07/2019 0029   ACIDBASEDEF 5.0 (H) 08/18/2019 2341   O2SAT 99.0 09/07/2019 0029   CBG (last 3)  Recent Labs    10/08/19 2158 10/09/19 0036 10/09/19 0435  GLUCAP 191* 167* 202*    Meds Scheduled Meds: .  stroke: mapping our early stages of recovery book   Does not apply Once  . sodium chloride   Intravenous Once  . amiodarone  200 mg Per Tube Daily  . arformoterol  15 mcg Nebulization BID  . aspirin  81 mg Per Tube Daily  . atorvastatin  40 mg Per Tube Daily  . bisacodyl  10 mg Rectal Daily  .  chlorhexidine  15 mL Mouth Rinse BID  . Chlorhexidine Gluconate Cloth  6 each Topical Q0600  . Chlorhexidine Gluconate Cloth  6 each Topical Q0600  . clonazePAM  0.5 mg Per Tube BID  . darbepoetin (ARANESP) injection - DIALYSIS  100 mcg Intravenous Q Tue-HD  . feeding supplement (PRO-STAT SUGAR FREE 64)  60 mL Per Tube TID  . insulin aspart  0-15 Units Subcutaneous Q4H  . insulin detemir  12 Units Subcutaneous BID  . mouth rinse  15 mL Mouth Rinse q12n4p  . midodrine  5 mg Per Tube TID WC  . multivitamin  1 tablet Per Tube QHS  . nystatin  5 mL Oral QID  . pantoprazole sodium  40 mg Per Tube Daily  . revefenacin  175 mcg Nebulization Daily  . sertraline  25 mg Per Tube Daily  . sevelamer carbonate  1.6 g Per Tube TID WC  . trospium  20 mg Per Tube BID   Continuous Infusions: . sodium chloride Stopped (09/14/19 0802)  . feeding supplement (NEPRO CARB STEADY) 1,000 mL (10/09/19 0007)  . ferric gluconate (FERRLECIT/NULECIT) IV 125 mg (10/07/19 1637)  . lactated ringers 20 mL/hr at 08/20/19 1200   PRN Meds:.acetaminophen (TYLENOL) oral liquid 160  mg/5 mL, albuterol, dextrose, Gerhardt's butt cream, guaiFENesin, hydrALAZINE, HYDROcodone-acetaminophen, hydrocortisone cream, HYDROmorphone (DILAUDID) injection, HYDROmorphone (DILAUDID) injection, ondansetron (ZOFRAN) IV, ondansetron (ZOFRAN) IV, oxyCODONE, phenol, Resource ThickenUp Clear, sennosides, sodium chloride flush  Xrays DG Chest 2 View  Result Date: 10/08/2019 CLINICAL DATA:  Chest pain EXAM: CHEST - 2 VIEW COMPARISON:  10/05/2019 FINDINGS: Cardiac shadow remains enlarged. Postsurgical changes are noted. Tracheostomy tube is been removed in the interval. Left jugular dialysis catheter is again noted and stable. The right lung is well aerated without focal infiltrate. Minimal left pleural effusion is noted stable from the prior study. IMPRESSION: Stable small left pleural effusion. No new acute abnormality noted. Electronically Signed    By: Inez Catalina M.D.   On: 10/08/2019 15:27    Assessment/Plan: S/P Procedure(s) (LRB): EVACUATION OF HEMATOMA (N/A)   1 remains quite stable, no specific new issues 2 ok for transfer to facility if all agree and HD arrangements in place   LOS: 61 days    John Giovanni PA-C Pager [phone] 10/09/2019

## 2019-10-09 NOTE — Progress Notes (Signed)
  Speech Language Pathology Treatment: Dysphagia  Patient Details Name: Christian [name] MRN: [id number] DOB: 1945-06-02 Today's Date: 10/09/2019 Time: 3086-5784 SLP Time Calculation (min) (ACUTE ONLY): 19 min  Assessment / Plan / Recommendation Clinical Impression  Pt making steady gains toward goals.  Asking for advanced diet.  Today consumed container of peaches and package of graham crackers with good attention, adequate mastication, strong voice post-swallow.He alternated bites of solids with sips of nectar thick liquids with intermittent verbal cues.  There were no overt s/s of aspiration.  Recommend advancing diet to dysphagia 3, nectar thick liquids. Reiterated precautions, specifically the need for thickener when drinking items coming from unit refrigerator as well as the temporary nature of his swallowing deficits.  SLP will continue to follow.   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 4/23 cardiac arrest, 4/29 initiated CRRT, vent s/p trach. 5/10 additional sternal washout unable to close.  5/12 sternal plating with pectoralis flap and omental harvest to mediastinum off CRRT with iHD started. Chest wound closed. Neurology consulted for left sided weakness- CTH neg 09/08/19. 5/20 pt with emergent hematoma evacuation and transition to trach collar; 5/28 downsized to #4 cuffless; ultimately decannulated. PMHx: BPH, CAD, DM, HLD, HTN Had MBS on 09/30/19, weakness, poor visualization of airway, could not recommend thin liquids. FEES repeated to determine best diet textures.       SLP Plan  Continue with current plan of care       Recommendations  Diet recommendations: Dysphagia 3 (mechanical soft);Nectar-thick liquid Liquids provided via: Cup;Straw Medication Administration: Crushed with puree Supervision: Full supervision/cueing for compensatory strategies Compensations: Slow rate;Small sips/bites;Follow solids  with liquid Postural Changes and/or Swallow Maneuvers: Upright 30-60 min after meal;Seated upright 90 degrees                Oral Care Recommendations: Oral care BID Follow up Recommendations: 24 hour supervision/assistance SLP Visit Diagnosis: Dysphagia, oropharyngeal phase (R13.12) Plan: Continue with current plan of care       GO               Christian [name] L. Tivis Ringer, [place] Office number [phone] Pager [phone]  Christian [name] [name] 10/09/2019, 2:19 PM

## 2019-10-09 NOTE — Progress Notes (Signed)
Renal Navigator followed up with [facility] Admissions and learned that patient has not yet been accepted by clinic Medical Director for OP HD treatment. Navigator faxed updated medical records from today for review.  Renal Navigator will continue to follow closely.  Alphonzo Cruise, [place] Renal Navigator [phone]

## 2019-10-09 NOTE — Progress Notes (Signed)
Nutrition Follow-up  DOCUMENTATION CODES:   Not applicable  INTERVENTION:   Nocturnal tube feeds via G-tube: - Nepro @ 60 ml/hr x 12 hours from 1800 to 0600 (720 ml total) - Pro-stat 60 ml BID  Tube feeding regimen provides 1696 kcal, 118 grams of protein, and 523 ml of H2O (80% of kcal needs, 69% of protein needs).  - Continue renal MVI daily per tube  - Vital Cuisine Shake TID, each supplement provides 520 kcal and 22 grams of protein  - Encourage adequate PO intake  NUTRITION DIAGNOSIS:   Increased nutrient needs related to post-op healing as evidenced by estimated needs.  Ongoing, being addressed via TF and supplements  GOAL:   Patient will meet greater than or equal to 90% of their needs  Progressing  MONITOR:   PO intake, Supplement acceptance, Diet advancement, Labs, Weight trends, TF tolerance, Skin, I & O's  REASON FOR ASSESSMENT:   Consult Enteral/tube feeding initiation and management  ASSESSMENT:   Patient with PMH significant for CAD s/p stenting, HTN, HLD, DM, and BPH. Presents this admission with RLL PE and for CABG.  4/14 - admitted  4/20 - CABG x 4, chest left open 4/22 - chest closure, application of incisional woundVAC 4/23 - extubated, cardiac arrest, chest re-opened for washout, clot removed, re-intubated 4/26 - return to OR for washout, cortrak placed, TF initiated 4/29 - chest washout, application of wound VAC, CRRT initiated 5/05 - chest washout, tracheostomy 5/10 - chest washout, wound VAC 5/12 - omental harvest, R pectoral muscle advancement flap, layered closure of sternal wound, wound VAC, stop CRRT 5/13 - first HD 5/20 - mediastinal exploration, evacuation of hematoma, wound VAC 5/24 - trach downsized6 cuffless 5/25 - L tunneled HD cath 5/28 - trach downsized 4 cuffless 6/07 - diet advanced to dysphagia 1, nectar-thick liquids 6/09 - s/p G-tube placement by IR 6/10 - decannulated 6/11 - NPO due to vomiting, back on  dysphagia 1, nectar-thick liquids in the afternoon 6/14 - diet advanced to dysphagia 3, nectar-thick liquids  Last HD on 10/07/19. Plan for next HD on 10/10/19.  Discussed pt with RN and with TCTS PA. Plan to transition pt from continuous TF to nocturnal TF. Per RN, pt does not like dysphagia 1 diet order. Discussed with SLP who advanced diet to dysphagia 3 today.  Spoke with pt at bedside. Pt frustrated and stating, "oh no, you're going to change everything." Explained plan for nocturnal TF.  Thickened oral nutrition supplements ordered with pt's meals.  Meal Completion: 50%  Medications reviewed and include: dulcolax, Aranesp, SSI q 4 hours, Levemir 12 units BID, rena-vit, protonix, Renvela TID with meals  Labs reviewed: sodium 133, hemoglobin 9.7 CBG's: 134-202 x 24 hours  Diet Order:   Diet Order            DIET DYS 3 Room service appropriate? Yes with Assist; Fluid consistency: Nectar Thick  Diet effective now                 EDUCATION NEEDS:   Not appropriate for education at this time  Skin:  Skin Assessment: Skin Integrity Issues: Stage II: right buttocks Incisions: chest, abdomen, neck, left arm  Last BM:  10/08/19  Height:   Ht Readings from Last 1 Encounters:  08/28/19 5\' 9"  (1.753 m)    Weight:   Wt Readings from Last 1 Encounters:  10/07/19 83 kg    BMI:  Body mass index is 27.02 kg/m.  Estimated Nutritional Needs:  Kcal:  2125-2550  Protein:  170-195 grams  Fluid:  >/= 2 L/day    Gaynell Face, MS, RD, LDN Inpatient Clinical Dietitian Pager: [phone] Weekend/After Hours: [phone]

## 2019-10-10 LAB — GLUCOSE, CAPILLARY
Glucose-Capillary: 140 mg/dL — ABNORMAL HIGH (ref 70–99)
Glucose-Capillary: 146 mg/dL — ABNORMAL HIGH (ref 70–99)
Glucose-Capillary: 160 mg/dL — ABNORMAL HIGH (ref 70–99)
Glucose-Capillary: 167 mg/dL — ABNORMAL HIGH (ref 70–99)
Glucose-Capillary: 187 mg/dL — ABNORMAL HIGH (ref 70–99)
Glucose-Capillary: 95 mg/dL (ref 70–99)

## 2019-10-10 LAB — CBC
HCT: 29.4 % — ABNORMAL LOW (ref 39.0–52.0)
Hemoglobin: 9.4 g/dL — ABNORMAL LOW (ref 13.0–17.0)
MCH: 31.5 pg (ref 26.0–34.0)
MCHC: 32 g/dL (ref 30.0–36.0)
MCV: 98.7 fL (ref 80.0–100.0)
Platelets: 406 10*3/uL — ABNORMAL HIGH (ref 150–400)
RBC: 2.98 MIL/uL — ABNORMAL LOW (ref 4.22–5.81)
RDW: 18.8 % — ABNORMAL HIGH (ref 11.5–15.5)
WBC: 16.2 10*3/uL — ABNORMAL HIGH (ref 4.0–10.5)
nRBC: 0 % (ref 0.0–0.2)

## 2019-10-10 LAB — RENAL FUNCTION PANEL
Albumin: 2.5 g/dL — ABNORMAL LOW (ref 3.5–5.0)
Anion gap: 16 — ABNORMAL HIGH (ref 5–15)
BUN: 128 mg/dL — ABNORMAL HIGH (ref 8–23)
CO2: 23 mmol/L (ref 22–32)
Calcium: 9.3 mg/dL (ref 8.9–10.3)
Chloride: 87 mmol/L — ABNORMAL LOW (ref 98–111)
Creatinine, Ser: 6.91 mg/dL — ABNORMAL HIGH (ref 0.61–1.24)
GFR calc Af Amer: 8 mL/min — ABNORMAL LOW (ref >60)
GFR calc non Af Amer: 7 mL/min — ABNORMAL LOW (ref >60)
Glucose, Bld: 143 mg/dL — ABNORMAL HIGH (ref 70–99)
Phosphorus: 6.7 mg/dL — ABNORMAL HIGH (ref 2.5–4.6)
Potassium: 4.4 mmol/L (ref 3.5–5.1)
Sodium: 126 mmol/L — ABNORMAL LOW (ref 135–145)

## 2019-10-10 LAB — MAGNESIUM: Magnesium: 2.8 mg/dL — ABNORMAL HIGH (ref 1.7–2.4)

## 2019-10-10 MED ORDER — SODIUM CHLORIDE 0.9 % IV SOLN: 100.0000 mL | INTRAVENOUS | Status: DC | PRN

## 2019-10-10 MED ORDER — PENTAFLUOROPROP-TETRAFLUOROETH EX AERO
1.0000 "application " | INHALATION_SPRAY | CUTANEOUS | Status: DC | PRN
  Filled 2019-10-10: qty 116

## 2019-10-10 MED ORDER — HEPARIN SODIUM (PORCINE) 1000 UNIT/ML DIALYSIS
1000.0000 [IU] | INTRAMUSCULAR | Status: DC | PRN
  Filled 2019-10-10: qty 1

## 2019-10-10 MED ORDER — LIDOCAINE HCL (PF) 1 % IJ SOLN: 5.0000 mL | INTRAMUSCULAR | Status: DC | PRN

## 2019-10-10 MED ORDER — HEPARIN SODIUM (PORCINE) 1000 UNIT/ML IJ SOLN
INTRAMUSCULAR | Status: AC
  Administered 2019-10-10: 4200 [IU] via INTRAVENOUS_CENTRAL
  Filled 2019-10-10: qty 5

## 2019-10-10 MED ORDER — DARBEPOETIN ALFA 100 MCG/0.5ML IJ SOSY
PREFILLED_SYRINGE | INTRAMUSCULAR | Status: AC
  Administered 2019-10-10: 100 ug via INTRAVENOUS
  Filled 2019-10-10: qty 0.5

## 2019-10-10 MED ORDER — LIDOCAINE-PRILOCAINE 2.5-2.5 % EX CREA
1.0000 "application " | TOPICAL_CREAM | CUTANEOUS | Status: DC | PRN
  Filled 2019-10-10: qty 5

## 2019-10-10 MED ORDER — ALTEPLASE 2 MG IJ SOLR
2.0000 mg | Freq: Once | INTRAMUSCULAR | Status: DC | PRN
  Filled 2019-10-10: qty 2

## 2019-10-10 MED ORDER — PANTOPRAZOLE SODIUM 40 MG PO TBEC
40.0000 mg | DELAYED_RELEASE_TABLET | Freq: Every day | ORAL | Status: DC
  Administered 2019-10-10 – 2019-10-18 (×8): 40 mg via ORAL
  Filled 2019-10-10 (×8): qty 1

## 2019-10-10 NOTE — Progress Notes (Signed)
PT Cancellation Note  Patient Details Name: Christian [name] MRN: [id number] DOB: 01/24/1946   Cancelled Treatment:    Reason Eval/Treat Not Completed: Patient at procedure or test/unavailable. Pt currently in HD. Will check back later.    Shary Decamp [facility] 10/10/2019, 8:35 AM [place] Pager [phone] Office [phone]

## 2019-10-10 NOTE — Progress Notes (Signed)
Renal Navigator discussed patient's case with inpatient Nephrologist who shares concerns with OP HD clinic Medical Director regarding patient's level of significant debility and whether or not he is appropriate for OP HD level of care at this point. Per Acute HD unit staff, patient successfully completed HD treatment in recliner on Saturday, 10/07/19, however, he was very uncomfortable. Patient informed Navigator yesterday that he is okay as long as he has his cream applied to his bottom first. Navigator has already notified OP HD clinic that patient will require a Hoyer lift in to the HD chair, at least at this point. Navigator has discussed this with patient, wife, and CSW also--need for [employer] to be under him when he arrives for OP HD (should he be accepted). Nephrologists would like LTACH to be evaluated. Navigator reviewed medical record and notes that PT has recommended LTACH earlier this month while working with patient. Navigator has sent message to PT/C. Maycock-listed on patient's chart today-to request a call when she has a chance. Navigator called Case Manager/D. Swist-covering CM today-to discuss patient's case and request a referral be completed to [facility] if appropriate. Renal Navigator appreciates CM's evaluation of case and referral to [facility] if possible.  Navigator will continue to follow closely.  Alphonzo Cruise, [place] Renal Navigator [phone]

## 2019-10-10 NOTE — Plan of Care (Signed)
  Problem: Education: Goal: Knowledge of General Education information will improve Description: Including pain rating scale, medication(s)/side effects and non-pharmacologic comfort measures Outcome: Progressing   Problem: Health Behavior/Discharge Planning: Goal: Ability to manage health-related needs will improve Outcome: Progressing   Problem: Clinical Measurements: Goal: Ability to maintain clinical measurements within normal limits will improve Outcome: Progressing   Problem: Clinical Measurements: Goal: Diagnostic test results will improve Outcome: Progressing   Problem: Clinical Measurements: Goal: Cardiovascular complication will be avoided Outcome: Progressing   Problem: Nutrition: Goal: Adequate nutrition will be maintained Outcome: Progressing   Problem: Coping: Goal: Level of anxiety will decrease Outcome: Progressing   Problem: Pain Managment: Goal: General experience of comfort will improve Outcome: Progressing   Problem: Skin Integrity: Goal: Risk for impaired skin integrity will decrease Outcome: Progressing   Problem: Education: Goal: Knowledge of disease or condition will improve Outcome: Progressing   Problem: Education: Goal: Knowledge of the prescribed therapeutic regimen will improve Outcome: Progressing   Problem: Cardiac: Goal: Will achieve and/or maintain hemodynamic stability Outcome: Progressing   Problem: Clinical Measurements: Goal: Postoperative complications will be avoided or minimized Outcome: Progressing   Problem: Skin Integrity: Goal: Wound healing without signs and symptoms of infection Outcome: Progressing   Problem: Skin Integrity: Goal: Risk for impaired skin integrity will decrease Outcome: Progressing

## 2019-10-10 NOTE — Progress Notes (Addendum)
Patient discussed with PT/C. Maycock, however, per CM/D. Swist, LTACH is not an option for patient at this time (see note from 10/10/19). Dr. Justin Mend updated.  Alphonzo Cruise, [place] Renal Navigator [phone]

## 2019-10-10 NOTE — Care Management (Signed)
Consulted with [facility], for review of chart to determine if patient is appropriate for LTAC level of care. At this time, patient has progressed beyond the level of care needed for admission to [facility]. Patient would not be able to be placed in LTAC for treatment based on debility.

## 2019-10-10 NOTE — Progress Notes (Signed)
Renal Navigator continues discussions with Nephrologist regarding plan for patient. We will see how patient does again with HD Thursday in recliner.   Alphonzo Cruise, [place] Renal Navigator [phone]

## 2019-10-10 NOTE — Progress Notes (Signed)
[address]       [place] [address]             [phone]      26 Days Post-Op Procedure(s) (LRB): EVACUATION OF HEMATOMA (N/A) Subjective:  having trouble with placement d/t debility Outpatient dialysis is difficult to arrange and not an LTAC candidate currently Working with PT Had dialysis earlier  Objective: Vital signs in last 24 hours: Temp:  [98 F (36.7 C)-99.3 F (37.4 C)] 98.2 F (36.8 C) (06/15 1228) Pulse Rate:  [96-108] 108 (06/15 1228) Cardiac Rhythm: Sinus tachycardia (06/15 0740) Resp:  [14-23] 20 (06/15 1228) BP: (90-164)/(35-76) 136/70 (06/15 1228) SpO2:  [98 %-100 %] 98 % (06/15 1228) Weight:  [84.4 kg] 84.4 kg (06/15 0745)  Hemodynamic parameters for last 24 hours:    Intake/Output from previous day: 06/14 0701 - 06/15 0700 In: 660 [NG/GT:660] Out: 0  Intake/Output this shift: Total I/O In: -  Out: 1306 [Other:1306]  General appearance: alert, cooperative and no distress Heart: regular rate and rhythm Lungs: mildly dim in bases Abdomen: benign Extremities: some ankle edema Wound: incis healing well  Lab Results: Recent Labs    10/10/19 0500  WBC 16.2*  HGB 9.4*  HCT 29.4*  PLT 406*   BMET:  Recent Labs    10/09/19 1054 10/10/19 0500  NA 127* 126*  K 4.0 4.4  CL 88* 87*  CO2 22 23  GLUCOSE 180* 143*  BUN 103* 128*  CREATININE 5.88* 6.91*  CALCIUM 9.5 9.3    PT/INR: No results for input(s): LABPROT, INR in the last 72 hours. ABG    Component Value Date/Time   PHART 7.481 (H) 09/07/2019 0029   HCO3 23.3 09/07/2019 0029   TCO2 24 09/07/2019 0029   ACIDBASEDEF 5.0 (H) 08/18/2019 2341   O2SAT 99.0 09/07/2019 0029   CBG (last 3)  Recent Labs    10/10/19 0023 10/10/19 0435 10/10/19 1241  GLUCAP 167* 187* 95    Meds Scheduled Meds: .  stroke: mapping our early stages of recovery book   Does not apply Once  . sodium chloride   Intravenous Once  . amiodarone  200 mg Per Tube Daily  .  arformoterol  15 mcg Nebulization BID  . aspirin  81 mg Per Tube Daily  . atorvastatin  40 mg Per Tube Daily  . bisacodyl  10 mg Rectal Daily  . chlorhexidine  15 mL Mouth Rinse BID  . Chlorhexidine Gluconate Cloth  6 each Topical Q0600  . Chlorhexidine Gluconate Cloth  6 each Topical Q0600  . clonazePAM  0.5 mg Per Tube BID  . darbepoetin (ARANESP) injection - DIALYSIS  100 mcg Intravenous Q Tue-HD  . feeding supplement (NEPRO CARB STEADY)  720 mL Per Tube Q24H  . feeding supplement (PRO-STAT SUGAR FREE 64)  60 mL Per Tube BID  . insulin aspart  0-15 Units Subcutaneous Q4H  . insulin detemir  12 Units Subcutaneous BID  . mouth rinse  15 mL Mouth Rinse q12n4p  . multivitamin  1 tablet Per Tube QHS  . nystatin  5 mL Oral QID  . pantoprazole  40 mg Oral Daily  . revefenacin  175 mcg Nebulization Daily  . sertraline  25 mg Per Tube Daily  . sevelamer carbonate  1.6 g Per Tube TID WC  . trospium  20 mg Per Tube BID   Continuous Infusions: . sodium chloride Stopped (09/14/19 0802)  . lactated ringers 20 mL/hr at  08/20/19 1200   PRN Meds:.acetaminophen (TYLENOL) oral liquid 160 mg/5 mL, albuterol, dextrose, Gerhardt's butt cream, guaiFENesin, hydrALAZINE, HYDROcodone-acetaminophen, hydrocortisone cream, HYDROmorphone (DILAUDID) injection, HYDROmorphone (DILAUDID) injection, ondansetron (ZOFRAN) IV, ondansetron (ZOFRAN) IV, oxyCODONE, phenol, Resource ThickenUp Clear, sennosides, sodium chloride flush  Xrays No results found.  Assessment/Plan: S/P Procedure(s) (LRB): EVACUATION OF HEMATOMA (N/A)  1 stable on current management. BP variable from 90-q50's 2 cont to push therapies as able severe debility 3 nephrol managing renal issues/HD, navigator assisting with outpatient arrangements for dialysis 4 leukocytosis trend improving 5 H/H fairly stable 6 hyponatremia- defer management to nephrology as HD managing volume status 7 placement is proving to be difficult, cont to work with  SW  LOS: 62 days    John Giovanni PA-C 10/10/2019

## 2019-10-10 NOTE — Progress Notes (Signed)
Physical Therapy Treatment Patient Details Name: CAS TRACZ MRN: [id number] DOB: 06-12-1945 Today's Date: 10/10/2019    History of Present Illness Pt is a 74 y.o. admitted 08/10/19 with STEMI, RLL PE. S/p CABGx4 on 4/20, remained open with wound VAC with repeated washouts. 4/23 cardiac arrest, 4/29 initiated CRRT, vent s/p trach. 5/10 additional sternal washout unable to close. 5/12 sternal plating with pectoralis flap and omental harvest to mediastinum; off CRRT with iHD started. Chest wound closed. Neurology consulted for acute L-sided weakness- head CT 5/14 negative for acute injury. 5/20 pt with emergent hematoma evacuation and transition to trach collar; 5/24 downsized to 6 trach; 5/28 downsized to cuffless trach. PEG tube placed 6/9. Trach decannulated 6/10. PMH includes BPH, CAD, DM, HLD, HTN    PT Comments    Worked on weight bearing and strengthening of LE's using Kreg tilt bed.   Tilted 30 degrees x 4 minutes with BP 125/68   Tilted 45 degrees x 6 minutes with BP 97/62. Pt with knees hyperextended and feet sliding forward.    Tilted 35 degrees x 10 minutes with BP 115/67. Pt able to to perform strengthening ex's at this level.  Per notes pt is not able to go to LTAC. Per nursing pt tolerated HD in recliner last week. Pt will need to be hoyered into w/c at SNF to go to HD and hoyered into HD recliner at center.    Follow Up Recommendations  SNF;Supervision/Assistance - 24 hour     Equipment Recommendations   (defer to next venue)    Recommendations for Other Services       Precautions / Restrictions Precautions Precautions: Fall;Sternal;Other (comment) Precaution Comments: Ostomy, PEG    Mobility  Bed Mobility                  Transfers Overall transfer level: Needs assistance               General transfer comment: Used Kreg tilt bed for partial standing for 20 minutes for 30-45 degrees  Ambulation/Gait             General Gait Details:  unable   Stairs             Wheelchair Mobility    Modified Rankin (Stroke Patients Only)       Balance                                            Cognition Arousal/Alertness: Awake/alert Behavior During Therapy: WFL for tasks assessed/performed Overall Cognitive Status: Within Functional Limits for tasks assessed                                        Exercises General Exercises - Upper Extremity Shoulder Flexion: AAROM;Both;Seated;10 reps (tilted at 35-45 degrees) Shoulder Horizontal ABduction: AAROM;Both;10 reps (tilted 35-45 degrees) Shoulder Horizontal ADduction: AAROM;Both;10 reps (tilted 35-45 degrees) Other Exercises Other Exercises: minisquats/quad sets 4 x 5 reps tilted at 35 degrees    General Comments        Pertinent Vitals/Pain Pain Assessment: Faces Faces Pain Scale: Hurts little more Pain Location: knees with flexion Pain Descriptors / Indicators: Grimacing Pain Intervention(s): Monitored during session;Repositioned    Home Living  Prior Function            PT Goals (current goals can now be found in the care plan section) Acute Rehab PT Goals Patient Stated Goal: return to independence Progress towards PT goals: Progressing toward goals    Frequency    Min 2X/week      PT Plan Current plan remains appropriate    Co-evaluation              AM-PAC PT "6 Clicks" Mobility   Outcome Measure  Help needed turning from your back to your side while in a flat bed without using bedrails?: A Lot Help needed moving from lying on your back to sitting on the side of a flat bed without using bedrails?: A Lot Help needed moving to and from a bed to a chair (including a wheelchair)?: Total Help needed standing up from a chair using your arms (e.g., wheelchair or bedside chair)?: Total Help needed to walk in hospital room?: Total Help needed climbing 3-5 steps with a  railing? : Total 6 Click Score: 8    End of Session   Activity Tolerance: Patient tolerated treatment well Patient left: in bed;with call bell/phone within reach;with family/visitor present Nurse Communication: Mobility status PT Visit Diagnosis: Other abnormalities of gait and mobility (R26.89);Muscle weakness (generalized) (M62.81);Other symptoms and signs involving the nervous system (R29.898)     Time: 1121-6244 PT Time Calculation (min) (ACUTE ONLY): 36 min  Charges:  $Therapeutic Activity: 23-37 mins                     [place] Pager [phone] Office [place] 10/10/2019, 4:07 PM

## 2019-10-10 NOTE — Progress Notes (Signed)
[facility] ROUNDING NOTE   Subjective:   This is a 74 year old gentleman status post CABG status post cardiac arrest due to tamponade developed acute kidney injury receiving dialysis.  Previous creatinine was 1.17 July 2019.  Started on CRRT 08/24/2019 to 09/06/2019 to manage volume.  Now tolerating intermittent hemodialysis through a right IJ TDC placed by interventional radiology 09/19/2019.  Last dialysis 10/07/2019.  Urine output appears to be minimal.  Blood pressure 142/76 pulse 100 temperature 98.2 O2 sats 100 % room air  Sodium 127 potassium 4 chloride 88 CO2 22 BUN 103 creatinine 5.8 glucose 180 calcium 9.5 phosphorus 5.3 albumin 2.7.  Hemoglobin 9.7 WBC 18.1  Last chest x-ray 10/08/2019 small left   pleural effusion  Aspirin 81 mg daily atorvastatin 40 mg daily, darbepoetin 100 mcg every Tuesday last administered 10/03/2019, insulin sliding scale, Levemir 12 units twice daily, midodrine 5 mg 3 times daily, Protonix 40 mg daily, Renvela 1.6 g 3 times daily, Sanctura 20 mg twice daily  Objective:  Vital signs in last 24 hours:  Temp:  [98 F (36.7 C)-98.4 F (36.9 C)] 98.2 F (36.8 C) (06/15 0339) Pulse Rate:  [96-100] 100 (06/15 0339) Resp:  [17-20] 17 (06/15 0339) BP: (133-164)/(70-76) 142/76 (06/15 0339) SpO2:  [99 %-100 %] 99 % (06/15 0339) Weight:  [84.4 kg] 84.4 kg (06/15 0500)  Weight change:  Filed Weights   10/07/19 0414 10/10/19 0500  Weight: 83 kg 84.4 kg    Intake/Output: I/O last 3 completed shifts: In: 60 [NG/GT:60] Out: 0    Intake/Output this shift:  Total I/O In: 600 [NG/GT:600] Out: 0   General:NAD, not in distress, lying on bed comfortable Heart:RRR, s1s2 nl no rubs Lungs: Clear bilateral, no wheezing Abdomen:soft, Non-tender, non-distended, gastric feeding tube Extremities:No edema Dialysis Access: Right IJ TDC, clean site with no evidence of bleeding.   Basic Metabolic Panel: Recent Labs  Lab 10/05/19 0248 10/05/19 0248  10/06/19 0315 10/06/19 0315 10/07/19 0301 10/08/19 0248 10/09/19 1054  NA 135  --  133*  --  134* 133* 127*  K 4.5  --  3.5  --  3.9 3.9 4.0  CL 96*  --  96*  --  94* 95* 88*  CO2 22  --  24  --  23 24 22   GLUCOSE 137*  --  170*  --  165* 152* 180*  BUN 113*  --  62*  --  98* 57* 103*  CREATININE 6.01*  --  4.05*  --  5.46* 3.97* 5.88*  CALCIUM 9.4   < > 9.4   < > 9.1 9.2 9.5  MG 2.6*  --  2.4  --  2.6* 2.3 2.5*  PHOS 9.0*  --  6.0*  --  7.5* 4.5 5.3*   < > = values in this interval not displayed.    Liver Function Tests: Recent Labs  Lab 10/05/19 0248 10/06/19 0315 10/07/19 0301 10/08/19 0248 10/09/19 1054  ALBUMIN 2.6* 2.7* 2.6* 2.7* 2.7*   No results for input(s): LIPASE, AMYLASE in the last 168 hours. No results for input(s): AMMONIA in the last 168 hours.  CBC: Recent Labs  Lab 10/03/19 0759 10/06/19 0316 10/07/19 1335  WBC 17.6* 17.1* 18.1*  HGB 7.4* 11.1* 9.7*  HCT 23.7* 35.4* 31.2*  MCV 100.0 99.7 100.3*  PLT 500* 479* 472*    Cardiac Enzymes: No results for input(s): CKTOTAL, CKMB, CKMBINDEX, TROPONINI in the last 168 hours.  BNP: Invalid input(s): POCBNP  CBG: Recent Labs  Lab  10/09/19 1121 10/09/19 1608 10/09/19 1954 10/10/19 0023 10/10/19 0435  GLUCAP 202* 134* 116* 167* 187*    Microbiology: Results for orders placed or performed during the hospital encounter of 08/09/19  Respiratory Panel by RT PCR (Flu A&B, Covid) - Nasopharyngeal Swab     Status: None   Collection Time: 08/09/19  3:31 PM   Specimen: Nasopharyngeal Swab  Result Value Ref Range Status   SARS Coronavirus 2 by RT PCR NEGATIVE NEGATIVE Final    Comment: (NOTE) SARS-CoV-2 target nucleic acids are NOT DETECTED. The SARS-CoV-2 RNA is generally detectable in upper respiratoy specimens during the acute phase of infection. The lowest concentration of SARS-CoV-2 viral copies this assay can detect is 131 copies/mL. A negative result does not preclude SARS-Cov-2 infection  and should not be used as the sole basis for treatment or other patient management decisions. A negative result may occur with  improper specimen collection/handling, submission of specimen other than nasopharyngeal swab, presence of viral mutation(s) within the areas targeted by this assay, and inadequate number of viral copies (<131 copies/mL). A negative result must be combined with clinical observations, patient history, and epidemiological information. The expected result is Negative. Fact Sheet for Patients:  PinkCheek.be Fact Sheet for Healthcare Providers:  GravelBags.it This test is not yet ap proved or cleared by the [place] FDA and  has been authorized for detection and/or diagnosis of SARS-CoV-2 by FDA under an Emergency Use Authorization (EUA). This EUA will remain  in effect (meaning this test can be used) for the duration of the COVID-19 declaration under Section 564(b)(1) of the Act, 21 U.S.C. section 360bbb-3(b)(1), unless the authorization is terminated or revoked sooner.    Influenza A by PCR NEGATIVE NEGATIVE Final   Influenza B by PCR NEGATIVE NEGATIVE Final    Comment: (NOTE) The Xpert Xpress SARS-CoV-2/FLU/RSV assay is intended as an aid in  the diagnosis of influenza from Nasopharyngeal swab specimens and  should not be used as a sole basis for treatment. Nasal washings and  aspirates are unacceptable for Xpert Xpress SARS-CoV-2/FLU/RSV  testing. Fact Sheet for Patients: PinkCheek.be Fact Sheet for Healthcare Providers: GravelBags.it This test is not yet approved or cleared by the [place] FDA and  has been authorized for detection and/or diagnosis of SARS-CoV-2 by  FDA under an Emergency Use Authorization (EUA). This EUA will remain  in effect (meaning this test can be used) for the duration of the  Covid-19 declaration under Section  564(b)(1) of the Act, 21  U.S.C. section 360bbb-3(b)(1), unless the authorization is  terminated or revoked. Performed at [facility], [place] [address] [place], [place] [address]   Surgical pcr screen     Status: None   Collection Time: 08/14/19 10:20 PM   Specimen: Nasal Mucosa; Nasal Swab  Result Value Ref Range Status   MRSA, PCR NEGATIVE NEGATIVE Final   Staphylococcus aureus NEGATIVE NEGATIVE Final    Comment: (NOTE) The Xpert SA Assay (FDA approved for NASAL specimens in patients 70 years of age and older), is one component of a comprehensive surveillance program. It is not intended to diagnose infection nor to guide or monitor treatment. Performed at [facility], [place] [address], [place], [place] [address]   Culture, blood (routine x 2)     Status: None   Collection Time: 08/17/19 11:32 AM   Specimen: BLOOD RIGHT HAND  Result Value Ref Range Status   Specimen Description BLOOD RIGHT HAND  Final   Special Requests  Final    BOTTLES DRAWN AEROBIC AND ANAEROBIC Blood Culture adequate volume   Culture   Final    NO GROWTH 5 DAYS Performed at [facility], [place] [address], [place], [place] [address]    Report Status 08/22/2019 FINAL  Final  Culture, blood (routine x 2)     Status: None   Collection Time: 08/17/19 11:39 AM   Specimen: BLOOD RIGHT HAND  Result Value Ref Range Status   Specimen Description BLOOD RIGHT HAND  Final   Special Requests   Final    BOTTLES DRAWN AEROBIC AND ANAEROBIC Blood Culture results may not be optimal due to an inadequate volume of blood received in culture bottles   Culture   Final    NO GROWTH 5 DAYS Performed at [facility], [place] [address] [place], [place] [address]    Report Status 08/22/2019 FINAL  Final  Culture, respiratory (non-expectorated)     Status: None   Collection Time: 08/27/19  7:42 AM   Specimen: Tracheal Aspirate; Respiratory  Result Value Ref Range Status   Specimen Description TRACHEAL  ASPIRATE  Final   Special Requests Immunocompromised  Final   Gram Stain   Final    RARE WBC PRESENT, PREDOMINANTLY PMN RARE GRAM POSITIVE RODS    Culture   Final    FEW Consistent with normal respiratory flora. Performed at [facility], [place] [address], [place], [place] [address]    Report Status 08/29/2019 FINAL  Final  Culture, blood (routine x 2)     Status: None   Collection Time: 08/27/19 10:30 AM   Specimen: BLOOD RIGHT HAND  Result Value Ref Range Status   Specimen Description BLOOD RIGHT HAND  Final   Special Requests   Final    BOTTLES DRAWN AEROBIC AND ANAEROBIC Blood Culture adequate volume   Culture   Final    NO GROWTH 5 DAYS Performed at [facility], [place] [address], [place], [place] [address]    Report Status 09/01/2019 FINAL  Final  Culture, blood (routine x 2)     Status: None   Collection Time: 08/27/19 12:20 PM   Specimen: BLOOD RIGHT HAND  Result Value Ref Range Status   Specimen Description BLOOD RIGHT HAND  Final   Special Requests   Final    BOTTLES DRAWN AEROBIC ONLY Blood Culture results may not be optimal due to an inadequate volume of blood received in culture bottles   Culture   Final    NO GROWTH 5 DAYS Performed at [facility], [place] [address] [place], [place] [address]    Report Status 09/01/2019 FINAL  Final  Culture, blood (routine x 2)     Status: None   Collection Time: 08/31/19  8:50 AM   Specimen: BLOOD RIGHT HAND  Result Value Ref Range Status   Specimen Description BLOOD RIGHT HAND  Final   Special Requests   Final    BOTTLES DRAWN AEROBIC ONLY Blood Culture results may not be optimal due to an inadequate volume of blood received in culture bottles   Culture   Final    NO GROWTH 5 DAYS Performed at [facility], [place] [address], [place], [place] [address]    Report Status 09/05/2019 FINAL  Final  Culture, blood (routine x 2)     Status: None   Collection Time: 08/31/19  9:01 AM   Specimen: BLOOD LEFT HAND   Result Value Ref Range  Status   Specimen Description BLOOD LEFT HAND  Final   Special Requests   Final    BOTTLES DRAWN AEROBIC ONLY Blood Culture results may not be optimal due to an inadequate volume of blood received in culture bottles   Culture   Final    NO GROWTH 5 DAYS Performed at [facility], [place] [address], [place], [place] [address]    Report Status 09/05/2019 FINAL  Final  Culture, respiratory (non-expectorated)     Status: None   Collection Time: 08/31/19  9:05 AM   Specimen: Tracheal Aspirate; Respiratory  Result Value Ref Range Status   Specimen Description TRACHEAL ASPIRATE  Final   Special Requests NONE  Final   Gram Stain   Final    NO WBC SEEN FEW GRAM NEGATIVE RODS RARE GRAM POSITIVE COCCI RARE GRAM POSITIVE RODS RARE GRAM VARIABLE ROD Performed at [facility], [place] [address] [place], [place] [address]    Culture RARE PSEUDOMONAS AERUGINOSA  Final   Report Status 09/02/2019 FINAL  Final   Organism ID, Bacteria PSEUDOMONAS AERUGINOSA  Final      Susceptibility   Pseudomonas aeruginosa - MIC*    CEFTAZIDIME <=1 SENSITIVE Sensitive     CIPROFLOXACIN <=0.25 SENSITIVE Sensitive     GENTAMICIN <=1 SENSITIVE Sensitive     IMIPENEM 2 SENSITIVE Sensitive     PIP/TAZO <=4 SENSITIVE Sensitive     CEFEPIME <=1 SENSITIVE Sensitive     * RARE PSEUDOMONAS AERUGINOSA  Aerobic Culture (superficial specimen)     Status: None   Collection Time: 09/04/19  8:22 AM   Specimen: Wound  Result Value Ref Range Status   Specimen Description WOUND  Final   Special Requests STERNAL WOUND SPEC A  Final   Gram Stain NO WBC SEEN NO ORGANISMS SEEN   Final   Culture   Final    NO GROWTH 2 DAYS Performed at [facility], [address] [place], [place] [address]    Report Status 09/06/2019 FINAL  Final  Culture, blood (routine x 2)     Status: None   Collection Time: 09/08/19  9:23 AM   Specimen: BLOOD LEFT HAND  Result Value Ref Range Status   Specimen  Description BLOOD LEFT HAND  Final   Special Requests   Final    BOTTLES DRAWN AEROBIC ONLY Blood Culture adequate volume   Culture   Final    NO GROWTH 5 DAYS Performed at [facility], [place] [address] [place], [place] [address]    Report Status 09/13/2019 FINAL  Final  Culture, blood (routine x 2)     Status: None   Collection Time: 09/08/19  9:27 AM   Specimen: BLOOD RIGHT HAND  Result Value Ref Range Status   Specimen Description BLOOD RIGHT HAND  Final   Special Requests   Final    BOTTLES DRAWN AEROBIC ONLY Blood Culture adequate volume   Culture   Final    NO GROWTH 5 DAYS Performed at [facility], [place] [address] [place], [place] [address]    Report Status 09/13/2019 FINAL  Final  Culture, respiratory (non-expectorated)     Status: None   Collection Time: 09/08/19  3:17 PM   Specimen: Tracheal Aspirate; Respiratory  Result Value Ref Range Status   Specimen Description TRACHEAL ASPIRATE  Final   Special Requests NONE  Final   Gram Stain NO RBC SEEN RARE GRAM POSITIVE COCCI IN PAIRS  Final   Culture   Final    Consistent with normal respiratory flora. Performed at [facility], [place] [address], [place], [place] [address]    Report Status 09/11/2019 FINAL  Final  Aerobic/Anaerobic Culture (surgical/deep wound)     Status: None   Collection Time: 09/14/19  9:17 AM   Specimen: Soft Tissue, Other  Result Value Ref Range Status   Specimen Description TISSUE  Final   Special Requests HEMATOMA FROM CHEST SPEC A  Final   Gram Stain   Final    FEW WBC PRESENT, PREDOMINANTLY PMN NO ORGANISMS SEEN    Culture   Final    No growth aerobically or anaerobically. Performed at [facility], [place] [address], [place], [place] [address]    Report Status 09/19/2019 FINAL  Final  Surgical PCR screen     Status: None   Collection Time: 09/23/19  2:48 PM   Specimen: Nasal Mucosa; Nasal Swab  Result Value Ref Range Status   MRSA, PCR NEGATIVE NEGATIVE Final    Staphylococcus aureus NEGATIVE NEGATIVE Final    Comment: (NOTE) The Xpert SA Assay (FDA approved for NASAL specimens in patients 88 years of age and older), is one component of a comprehensive surveillance program. It is not intended to diagnose infection nor to guide or monitor treatment. Performed at [facility], [place] [address], [place], [place] [address]     Coagulation Studies: No results for input(s): LABPROT, INR in the last 72 hours.  Urinalysis: No results for input(s): COLORURINE, LABSPEC, PHURINE, GLUCOSEU, HGBUR, BILIRUBINUR, KETONESUR, PROTEINUR, UROBILINOGEN, NITRITE, LEUKOCYTESUR in the last 72 hours.  Invalid input(s): APPERANCEUR    Imaging: DG Chest 2 View  Result Date: 10/08/2019 CLINICAL DATA:  Chest pain EXAM: CHEST - 2 VIEW COMPARISON:  10/05/2019 FINDINGS: Cardiac shadow remains enlarged. Postsurgical changes are noted. Tracheostomy tube is been removed in the interval. Left jugular dialysis catheter is again noted and stable. The right lung is well aerated without focal infiltrate. Minimal left pleural effusion is noted stable from the prior study. IMPRESSION: Stable small left pleural effusion. No new acute abnormality noted. Electronically Signed   By: Inez Catalina M.D.   On: 10/08/2019 15:27     Medications:   . sodium chloride Stopped (09/14/19 0802)  . sodium chloride    . sodium chloride    . ferric gluconate (FERRLECIT/NULECIT) IV 125 mg (10/07/19 1637)  . lactated ringers 20 mL/hr at 08/20/19 1200   .  stroke: mapping our early stages of recovery book   Does not apply Once  . sodium chloride   Intravenous Once  . amiodarone  200 mg Per Tube Daily  . arformoterol  15 mcg Nebulization BID  . aspirin  81 mg Per Tube Daily  . atorvastatin  40 mg Per Tube Daily  . bisacodyl  10 mg Rectal Daily  . chlorhexidine  15 mL Mouth Rinse BID  . Chlorhexidine Gluconate Cloth  6 each Topical Q0600  . Chlorhexidine Gluconate Cloth  6 each Topical  Q0600  . clonazePAM  0.5 mg Per Tube BID  . darbepoetin (ARANESP) injection - DIALYSIS  100 mcg Intravenous Q Tue-HD  . feeding supplement (NEPRO CARB STEADY)  720 mL Per Tube Q24H  . feeding supplement (PRO-STAT SUGAR FREE 64)  60 mL Per Tube BID  . insulin aspart  0-15 Units Subcutaneous Q4H  . insulin detemir  12 Units Subcutaneous BID  . mouth rinse  15 mL Mouth  Rinse q12n4p  . midodrine  5 mg Per Tube TID WC  . multivitamin  1 tablet Per Tube QHS  . nystatin  5 mL Oral QID  . pantoprazole sodium  40 mg Per Tube Daily  . revefenacin  175 mcg Nebulization Daily  . sertraline  25 mg Per Tube Daily  . sevelamer carbonate  1.6 g Per Tube TID WC  . trospium  20 mg Per Tube BID   sodium chloride, sodium chloride, acetaminophen (TYLENOL) oral liquid 160 mg/5 mL, albuterol, alteplase, dextrose, Gerhardt's butt cream, guaiFENesin, heparin, hydrALAZINE, HYDROcodone-acetaminophen, hydrocortisone cream, HYDROmorphone (DILAUDID) injection, HYDROmorphone (DILAUDID) injection, lidocaine (PF), lidocaine-prilocaine, ondansetron (ZOFRAN) IV, ondansetron (ZOFRAN) IV, oxyCODONE, pentafluoroprop-tetrafluoroeth, phenol, Resource ThickenUp Clear, sennosides, sodium chloride flush  Assessment/ Plan:   Acute kidney injury status post cardiac catheterization, CABG, cardiogenic shock and cardiac arrest.  Started CRRT 08/24/2019 until 09/06/2019 for volume control.  Now tolerating intermittent dialysis.  His last dialysis treatment was 10/07/2019 with 1.7 L ultrafiltration tolerated well continues to be anuric.  Watching for recovery.  Plan for dialysis 10/10/2019 TTS schedule.  Appreciate assistance from renal navigator.  Have  texted conversation with Dr. Royce Macadamia medical director [facility].  She has concerns about patient's debilitated state.  I would recommend a physical therapy assessment to ensure patient's mobility status will not impact him receiving dialysis.  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  Appears to be tolerating well with dialysis.  Appears that blood pressures significantly improved we will discontinue midodrine at this point.  Chest x-ray does not show any significant pulmonary congestion 10/09/2019  Coronary artery disease status post CABG complicated by cardiac tamponade status post reopening chest urgently 08/18/2019 with wound VAC.  Most recently went back to the OR on 5/12 and 09/14/2019.  Continues on aspirin and atorvastatin  Atrial fibrillation per primary service  Last 2D echo showed systolic heart failure 10/11/8370 ejection fraction of about 40 to 45%.   LOS: [place] @TODAY @6 :40 AM

## 2019-10-10 NOTE — Progress Notes (Deleted)
Patient has been declined by [facility] due to severe debility. Per discussion with Clinic Manager, this clinic has multiple [facility] lift patients already and cannot accommodate another at this time. Navigator will attempt another clinic for review.  Alphonzo Cruise, [place] Renal Navigator [phone]

## 2019-10-11 LAB — MAGNESIUM: Magnesium: 2.3 mg/dL (ref 1.7–2.4)

## 2019-10-11 LAB — RENAL FUNCTION PANEL
Albumin: 2.4 g/dL — ABNORMAL LOW (ref 3.5–5.0)
Anion gap: 17 — ABNORMAL HIGH (ref 5–15)
BUN: 59 mg/dL — ABNORMAL HIGH (ref 8–23)
CO2: 22 mmol/L (ref 22–32)
Calcium: 9.2 mg/dL (ref 8.9–10.3)
Chloride: 95 mmol/L — ABNORMAL LOW (ref 98–111)
Creatinine, Ser: 4.25 mg/dL — ABNORMAL HIGH (ref 0.61–1.24)
GFR calc Af Amer: 15 mL/min — ABNORMAL LOW (ref >60)
GFR calc non Af Amer: 13 mL/min — ABNORMAL LOW (ref >60)
Glucose, Bld: 200 mg/dL — ABNORMAL HIGH (ref 70–99)
Phosphorus: 5.3 mg/dL — ABNORMAL HIGH (ref 2.5–4.6)
Potassium: 3.8 mmol/L (ref 3.5–5.1)
Sodium: 134 mmol/L — ABNORMAL LOW (ref 135–145)

## 2019-10-11 LAB — GLUCOSE, CAPILLARY
Glucose-Capillary: 138 mg/dL — ABNORMAL HIGH (ref 70–99)
Glucose-Capillary: 146 mg/dL — ABNORMAL HIGH (ref 70–99)
Glucose-Capillary: 173 mg/dL — ABNORMAL HIGH (ref 70–99)
Glucose-Capillary: 176 mg/dL — ABNORMAL HIGH (ref 70–99)
Glucose-Capillary: 184 mg/dL — ABNORMAL HIGH (ref 70–99)
Glucose-Capillary: 205 mg/dL — ABNORMAL HIGH (ref 70–99)

## 2019-10-11 MED ORDER — CHLORHEXIDINE GLUCONATE CLOTH 2 % EX PADS: 6.0000 | MEDICATED_PAD | Freq: Every day | CUTANEOUS | Status: DC

## 2019-10-11 NOTE — Plan of Care (Signed)
Courtesy Note/[place]:   Regarding outpatient HD planning:  Per discussion with Dr. Justin Mend on 6/15 he felt pt seemed more appropriate for LTAC.  However Dr. Justin Mend states he was informed LTAC turned pt down as was not felt to need.  PT's notes rec SNF.    I saw patient today at bedside.  Pt states that HD was difficult due to a sore on his backside.  Not in chair on 6/15 treatment per charting.  Dialyzed at least once in chair per charting but very uncomfortable.  He will need to tolerate dialysis in a chair then please contact me to reassess outpatient placement if [facility] is requested.  Blood pressure was also previously noted as a concern - 140's on my exam and dropped to 90/35 during HD per charted vitals.  Continue to monitor per nephrology  Claudia Desanctis, MD  10/11/2019  12:41 PM

## 2019-10-11 NOTE — Progress Notes (Signed)
27 Days Post-Op  Subjective: No changes  Objective: Vital signs in last 24 hours: Temp:  [97.9 F (36.6 C)-98.2 F (36.8 C)] 98.1 F (36.7 C) (06/16 1143) Pulse Rate:  [104-109] 106 (06/16 0703) Resp:  [16-20] 20 (06/16 1143) BP: (115-147)/(67-72) 147/69 (06/16 0703) SpO2:  [96 %-100 %] 99 % (06/16 0703) FiO2 (%):  [21 %] 21 % (06/15 1950) Last BM Date: 10/10/19 (on night shift per report )  Intake/Output from previous day: 06/15 0701 - 06/16 0700 In: 646 [P.O.:320; NG/GT:326] Out: 1306  Intake/Output this shift: No intake/output data recorded.  General appearance: alert, cooperative, no distress and resting in bed Chest wall: no tenderness, no dehiscence of incision noted, no drainage, no erythema, no fluid wave or collections noted.    Lab Results:  CBC Latest Ref Rng & Units 10/10/2019 10/07/2019 10/06/2019  WBC 4.0 - 10.5 K/uL 16.2(H) 18.1(H) 17.1(H)  Hemoglobin 13.0 - 17.0 g/dL 9.4(L) 9.7(L) 11.1(L)  Hematocrit 39 - 52 % 29.4(L) 31.2(L) 35.4(L)  Platelets 150 - 400 K/uL 406(H) 472(H) 479(H)    BMET Recent Labs    10/10/19 0500 10/11/19 0310  NA 126* 134*  K 4.4 3.8  CL 87* 95*  CO2 23 22  GLUCOSE 143* 200*  BUN 128* 59*  CREATININE 6.91* 4.25*  CALCIUM 9.3 9.2   PT/INR No results for input(s): LABPROT, INR in the last 72 hours. ABG No results for input(s): PHART, HCO3 in the last 72 hours.  Invalid input(s): PCO2, PO2  Studies/Results: No results found.  Anti-infectives: Anti-infectives (From admission, onward)   Start     Dose/Rate Route Frequency Ordered Stop   10/04/19 0800  ceFAZolin (ANCEF) IVPB 2g/100 mL premix        2 g 200 mL/hr over 30 Minutes Intravenous To Radiology 10/03/19 1045 10/04/19 1545   09/19/19 1417  ceFAZolin (ANCEF) 2-4 GM/100ML-% IVPB       Note to Pharmacy: Rudene Re   : cabinet override      09/19/19 1417 09/20/19 0229   09/19/19 1330  ceFAZolin (ANCEF) IVPB 1 g/50 mL premix  Status:  Discontinued        1 g 100  mL/hr over 30 Minutes Intravenous Once 09/19/19 1317 09/19/19 1329   09/19/19 1330  ceFAZolin (ANCEF) IVPB 2g/100 mL premix        2 g 200 mL/hr over 30 Minutes Intravenous  Once 09/19/19 1327 09/19/19 1448   09/14/19 0915  vancomycin (VANCOCIN) IVPB 1000 mg/200 mL premix        1,000 mg 200 mL/hr over 60 Minutes Intravenous To Surgery 09/14/19 0907 09/15/19 0915   09/07/19 2000  vancomycin (VANCOREADY) IVPB 750 mg/150 mL        750 mg 150 mL/hr over 60 Minutes Intravenous  Once 09/07/19 1510 09/07/19 2245   09/06/19 2200  meropenem (MERREM) 500 mg in sodium chloride 0.9 % 100 mL IVPB        500 mg 200 mL/hr over 30 Minutes Intravenous Every 24 hours 09/06/19 1542 09/09/19 0015   09/06/19 2000  fluconazole (DIFLUCAN) IVPB 400 mg        400 mg 100 mL/hr over 120 Minutes Intravenous Every 24 hours 09/06/19 1542 09/08/19 2240   09/06/19 1800  vancomycin (VANCOREADY) IVPB 750 mg/150 mL        750 mg 150 mL/hr over 60 Minutes Intravenous  Once 09/06/19 1542 09/07/19 0022   09/06/19 1540  vancomycin variable dose per unstable renal function (pharmacist dosing)  Does not apply See admin instructions 09/06/19 1542 09/08/19 2359   09/06/19 1100  ceFAZolin (ANCEF) IVPB 2g/100 mL premix  Status:  Discontinued        2 g 200 mL/hr over 30 Minutes Intravenous 30 min pre-op 09/05/19 1717 09/06/19 1644   09/06/19 0600  ceFAZolin (ANCEF) IVPB 2g/100 mL premix  Status:  Discontinued        2 g 200 mL/hr over 30 Minutes Intravenous To Short Stay 09/06/19 0546 09/06/19 1644   09/02/19 2200  meropenem (MERREM) 1 g in sodium chloride 0.9 % 100 mL IVPB  Status:  Discontinued        1 g 200 mL/hr over 30 Minutes Intravenous Every 8 hours 09/02/19 1011 09/06/19 1542   09/02/19 2000  fluconazole (DIFLUCAN) IVPB 800 mg  Status:  Discontinued        800 mg 100 mL/hr over 240 Minutes Intravenous Every 24 hours 09/02/19 1011 09/06/19 1542   09/02/19 2000  fluconazole (DIFLUCAN) IVPB 800 mg  Status:   Discontinued        800 mg 200 mL/hr over 120 Minutes Intravenous Every 24 hours 09/02/19 1329 09/02/19 1330   09/02/19 1800  vancomycin (VANCOCIN) IVPB 1000 mg/200 mL premix  Status:  Discontinued        1,000 mg 200 mL/hr over 60 Minutes Intravenous Every 24 hours 09/02/19 1011 09/06/19 1542   09/02/19 0900  meropenem (MERREM) 1 g in sodium chloride 0.9 % 100 mL IVPB  Status:  Discontinued        1 g 200 mL/hr over 30 Minutes Intravenous Every 24 hours 09/01/19 1333 09/02/19 1011   09/01/19 1700  fluconazole (DIFLUCAN) IVPB 800 mg        800 mg 100 mL/hr over 240 Minutes Intravenous  Once 09/01/19 1645 09/01/19 2057   09/01/19 1630  fluconazole (DIFLUCAN) IVPB 400 mg  Status:  Discontinued        400 mg 100 mL/hr over 120 Minutes Intravenous  Once 09/01/19 1629 09/01/19 1645   09/01/19 1330  vancomycin variable dose per unstable renal function (pharmacist dosing)  Status:  Discontinued         Does not apply See admin instructions 09/01/19 1333 09/02/19 1325   09/01/19 0730  meropenem (MERREM) 1 g in sodium chloride 0.9 % 100 mL IVPB  Status:  Discontinued        1 g 200 mL/hr over 30 Minutes Intravenous Every 8 hours 09/01/19 0729 09/01/19 1327   08/31/19 2200  vancomycin (VANCOCIN) IVPB 1000 mg/200 mL premix  Status:  Discontinued        1,000 mg 200 mL/hr over 60 Minutes Intravenous Every 24 hours 08/30/19 2235 09/01/19 1330   08/25/19 0800  vancomycin (VANCOREADY) IVPB 1250 mg/250 mL  Status:  Discontinued        1,250 mg 166.7 mL/hr over 90 Minutes Intravenous Every 24 hours 08/24/19 2121 08/24/19 2128   08/25/19 0100  vancomycin (VANCOREADY) IVPB 1250 mg/250 mL  Status:  Discontinued        1,250 mg 166.7 mL/hr over 90 Minutes Intravenous Every 24 hours 08/24/19 2128 08/30/19 2235   08/24/19 2300  ceFEPIme (MAXIPIME) 2 g in sodium chloride 0.9 % 100 mL IVPB  Status:  Discontinued        2 g 200 mL/hr over 30 Minutes Intravenous Every 12 hours 08/24/19 2150 09/01/19 0729    08/24/19 1338  vancomycin (VANCOCIN) 1,000 mg in sodium chloride 0.9 % 1,000 mL irrigation  Status:  Discontinued          As needed 08/24/19 1338 08/24/19 1508   08/21/19 0000  ceFEPIme (MAXIPIME) 1 g in sodium chloride 0.9 % 100 mL IVPB  Status:  Discontinued        1 g 200 mL/hr over 30 Minutes Intravenous Every 24 hours 08/20/19 0954 08/24/19 2150   08/20/19 1200  vancomycin (VANCOCIN) IVPB 1000 mg/200 mL premix        1,000 mg 200 mL/hr over 60 Minutes Intravenous  Once 08/20/19 0954 08/21/19 0011   08/19/19 2129  vancomycin variable dose per unstable renal function (pharmacist dosing)  Status:  Discontinued         Does not apply See admin instructions 08/19/19 2129 08/24/19 2128   08/19/19 0000  ceFEPIme (MAXIPIME) 2 g in sodium chloride 0.9 % 100 mL IVPB  Status:  Discontinued        2 g 200 mL/hr over 30 Minutes Intravenous Every 24 hours 08/18/19 2351 08/20/19 0954   08/19/19 0000  vancomycin (VANCOREADY) IVPB 2000 mg/400 mL        2,000 mg 200 mL/hr over 120 Minutes Intravenous  Once 08/18/19 2351 08/19/19 0354   08/16/19 1731  vancomycin (VANCOCIN) powder  Status:  Discontinued          As needed 08/16/19 1732 08/16/19 1808   08/15/19 2045  vancomycin (VANCOCIN) IVPB 1000 mg/200 mL premix        1,000 mg 200 mL/hr over 60 Minutes Intravenous  Once 08/15/19 1402 08/15/19 2209   08/15/19 1645  cefUROXime (ZINACEF) 1.5 g in sodium chloride 0.9 % 100 mL IVPB        1.5 g 200 mL/hr over 30 Minutes Intravenous Every 12 hours 08/15/19 1402 08/17/19 0700   08/15/19 0958  vancomycin (VANCOCIN) powder  Status:  Discontinued          As needed 08/15/19 1000 08/15/19 1402   08/15/19 0400  vancomycin (VANCOREADY) IVPB 1500 mg/300 mL        1,500 mg 150 mL/hr over 120 Minutes Intravenous To Surgery 08/14/19 1121 08/15/19 0830   08/15/19 0400  cefUROXime (ZINACEF) 1.5 g in sodium chloride 0.9 % 100 mL IVPB        1.5 g 200 mL/hr over 30 Minutes Intravenous To Surgery 08/14/19 1121  08/15/19 0812   08/15/19 0400  cefUROXime (ZINACEF) 750 mg in sodium chloride 0.9 % 100 mL IVPB        750 mg 200 mL/hr over 30 Minutes Intravenous To Surgery 08/14/19 1121 08/15/19 1300      Assessment/Plan: s/p Procedure(s): EVACUATION OF HEMATOMA  Incisions stable, doing well in regards to sternal incision and pectoralis flap.       LOS: 59 days    Charlies Constable, PA-C 10/11/2019

## 2019-10-11 NOTE — TOC Progression Note (Signed)
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 [employer] [place] [facility] application has been completed.    Expected Discharge Plan: [place] Barriers to Discharge: Continued Medical Work up  Expected Discharge Plan and Services Expected Discharge Plan: [place] arrangements for the past 2 months: Single Family Home  Readmission Risk Interventions No flowsheet data found.

## 2019-10-11 NOTE — Progress Notes (Signed)
[facility] ROUNDING NOTE   Subjective:   This is a 74 year old gentleman status post CABG status post cardiac arrest due to tamponade developed acute kidney injury receiving dialysis.  Previous creatinine was 1.17 July 2019.  Started on CRRT 08/24/2019 to 09/06/2019 to manage volume.  Now tolerating intermittent hemodialysis through a right IJ TDC placed by interventional radiology 09/19/2019.  Physical therapy assessment appreciate assistance from [facility] recommendations for SNF placement.  Patient beyond level of care that would be provided by LTAC.  Last dialysis 10/10/2019 with ultrafiltration of 1.3 L.  Urine output appears to be minimal.  Blood pressure 132/70 pulse 105 temperature 98 O2 sats 98% room air  Sodium 134 potassium 3.8 chloride 92 CO2 22 BUN 59 creatinine 4.25 glucose 200 calcium 9.2 phosphorus 5.3 albumin 2.4 hemoglobin 9.4 WBC 16.2  Last chest x-ray 10/08/2019 small left   pleural effusion  Aspirin 81 mg daily atorvastatin 40 mg daily, darbepoetin 100 mcg every Tuesday last administered 10/03/2019, insulin sliding scale, Levemir 12 units twice daily, midodrine 5 mg 3 times daily, Protonix 40 mg daily, Renvela 1.6 g 3 times daily, Sanctura 20 mg twice daily  Objective:  Vital signs in last 24 hours:  Temp:  [97.9 F (36.6 C)-99.3 F (37.4 C)] 98 F (36.7 C) (06/16 0420) Pulse Rate:  [100-109] 104 (06/16 0420) Resp:  [14-23] 16 (06/16 0420) BP: (90-137)/(35-72) 132/70 (06/16 0420) SpO2:  [96 %-100 %] 98 % (06/16 0420) FiO2 (%):  [21 %] 21 % (06/15 1950) Weight:  [84.4 kg] 84.4 kg (06/15 0745)  Weight change: 0 kg Filed Weights   10/07/19 0414 10/10/19 0500 10/10/19 0745  Weight: 83 kg 84.4 kg 84.4 kg    Intake/Output: I/O last 3 completed shifts: In: 860 [P.O.:200; NG/GT:660] Out: 5400 [Other:1306]   Intake/Output this shift:  Total I/O In: 446 [P.O.:120; NG/GT:326] Out: -   General:NAD, not in distress, lying on bed  comfortable Heart:RRR, s1s2 nl no rubs Lungs: Clear bilateral, no wheezing Abdomen:soft, Non-tender, non-distended, gastric feeding tube Extremities:No edema Dialysis Access: Right IJ TDC, clean site with no evidence of bleeding.   Basic Metabolic Panel: Recent Labs  Lab 10/07/19 0301 10/07/19 0301 10/08/19 0248 10/08/19 0248 10/09/19 1054 10/10/19 0500 10/11/19 0310  NA 134*  --  133*  --  127* 126* 134*  K 3.9  --  3.9  --  4.0 4.4 3.8  CL 94*  --  95*  --  88* 87* 95*  CO2 23  --  24  --  22 23 22   GLUCOSE 165*  --  152*  --  180* 143* 200*  BUN 98*  --  57*  --  103* 128* 59*  CREATININE 5.46*  --  3.97*  --  5.88* 6.91* 4.25*  CALCIUM 9.1   < > 9.2   < > 9.5 9.3 9.2  MG 2.6*  --  2.3  --  2.5* 2.8* 2.3  PHOS 7.5*  --  4.5  --  5.3* 6.7* 5.3*   < > = values in this interval not displayed.    Liver Function Tests: Recent Labs  Lab 10/07/19 0301 10/08/19 0248 10/09/19 1054 10/10/19 0500 10/11/19 0310  ALBUMIN 2.6* 2.7* 2.7* 2.5* 2.4*   No results for input(s): LIPASE, AMYLASE in the last 168 hours. No results for input(s): AMMONIA in the last 168 hours.  CBC: Recent Labs  Lab 10/06/19 0316 10/07/19 1335 10/10/19 0500  WBC 17.1* 18.1* 16.2*  HGB 11.1* 9.7* 9.4*  HCT 35.4* 31.2* 29.4*  MCV 99.7 100.3* 98.7  PLT 479* 472* 406*    Cardiac Enzymes: No results for input(s): CKTOTAL, CKMB, CKMBINDEX, TROPONINI in the last 168 hours.  BNP: Invalid input(s): POCBNP  CBG: Recent Labs  Lab 10/10/19 1241 10/10/19 1626 10/10/19 1932 10/10/19 2302 10/11/19 0418  GLUCAP 95 146* 160* 140* 205*    Microbiology: Results for orders placed or performed during the hospital encounter of 08/09/19  Respiratory Panel by RT PCR (Flu A&B, Covid) - Nasopharyngeal Swab     Status: None   Collection Time: 08/09/19  3:31 PM   Specimen: Nasopharyngeal Swab  Result Value Ref Range Status   SARS Coronavirus 2 by RT PCR NEGATIVE NEGATIVE Final    Comment:  (NOTE) SARS-CoV-2 target nucleic acids are NOT DETECTED. The SARS-CoV-2 RNA is generally detectable in upper respiratoy specimens during the acute phase of infection. The lowest concentration of SARS-CoV-2 viral copies this assay can detect is 131 copies/mL. A negative result does not preclude SARS-Cov-2 infection and should not be used as the sole basis for treatment or other patient management decisions. A negative result may occur with  improper specimen collection/handling, submission of specimen other than nasopharyngeal swab, presence of viral mutation(s) within the areas targeted by this assay, and inadequate number of viral copies (<131 copies/mL). A negative result must be combined with clinical observations, patient history, and epidemiological information. The expected result is Negative. Fact Sheet for Patients:  PinkCheek.be Fact Sheet for Healthcare Providers:  GravelBags.it This test is not yet ap proved or cleared by the [place] FDA and  has been authorized for detection and/or diagnosis of SARS-CoV-2 by FDA under an Emergency Use Authorization (EUA). This EUA will remain  in effect (meaning this test can be used) for the duration of the COVID-19 declaration under Section 564(b)(1) of the Act, 21 U.S.C. section 360bbb-3(b)(1), unless the authorization is terminated or revoked sooner.    Influenza A by PCR NEGATIVE NEGATIVE Final   Influenza B by PCR NEGATIVE NEGATIVE Final    Comment: (NOTE) The Xpert Xpress SARS-CoV-2/FLU/RSV assay is intended as an aid in  the diagnosis of influenza from Nasopharyngeal swab specimens and  should not be used as a sole basis for treatment. Nasal washings and  aspirates are unacceptable for Xpert Xpress SARS-CoV-2/FLU/RSV  testing. Fact Sheet for Patients: PinkCheek.be Fact Sheet for Healthcare  Providers: GravelBags.it This test is not yet approved or cleared by the [place] FDA and  has been authorized for detection and/or diagnosis of SARS-CoV-2 by  FDA under an Emergency Use Authorization (EUA). This EUA will remain  in effect (meaning this test can be used) for the duration of the  Covid-19 declaration under Section 564(b)(1) of the Act, 21  U.S.C. section 360bbb-3(b)(1), unless the authorization is  terminated or revoked. Performed at [facility], [place] [address] [place], [place] [address]   Surgical pcr screen     Status: None   Collection Time: 08/14/19 10:20 PM   Specimen: Nasal Mucosa; Nasal Swab  Result Value Ref Range Status   MRSA, PCR NEGATIVE NEGATIVE Final   Staphylococcus aureus NEGATIVE NEGATIVE Final    Comment: (NOTE) The Xpert SA Assay (FDA approved for NASAL specimens in patients 17 years of age and older), is one component of a comprehensive surveillance program. It is not intended to diagnose infection nor to guide or monitor treatment. Performed at [facility], [place] [address], [place], [place] [address]  Culture, blood (routine x 2)     Status: None   Collection Time: 08/17/19 11:32 AM   Specimen: BLOOD RIGHT HAND  Result Value Ref Range Status   Specimen Description BLOOD RIGHT HAND  Final   Special Requests   Final    BOTTLES DRAWN AEROBIC AND ANAEROBIC Blood Culture adequate volume   Culture   Final    NO GROWTH 5 DAYS Performed at [facility], [address], [place], [place] [address]    Report Status 08/22/2019 FINAL  Final  Culture, blood (routine x 2)     Status: None   Collection Time: 08/17/19 11:39 AM   Specimen: BLOOD RIGHT HAND  Result Value Ref Range Status   Specimen Description BLOOD RIGHT HAND  Final   Special Requests   Final    BOTTLES DRAWN AEROBIC AND ANAEROBIC Blood Culture results may not be optimal due to an inadequate volume of blood received in culture bottles    Culture   Final    NO GROWTH 5 DAYS Performed at [facility], [place] [address], [place], [place] [address]    Report Status 08/22/2019 FINAL  Final  Culture, respiratory (non-expectorated)     Status: None   Collection Time: 08/27/19  7:42 AM   Specimen: Tracheal Aspirate; Respiratory  Result Value Ref Range Status   Specimen Description TRACHEAL ASPIRATE  Final   Special Requests Immunocompromised  Final   Gram Stain   Final    RARE WBC PRESENT, PREDOMINANTLY PMN RARE GRAM POSITIVE RODS    Culture   Final    FEW Consistent with normal respiratory flora. Performed at [facility], [place] [address] [place], [place] [address]    Report Status 08/29/2019 FINAL  Final  Culture, blood (routine x 2)     Status: None   Collection Time: 08/27/19 10:30 AM   Specimen: BLOOD RIGHT HAND  Result Value Ref Range Status   Specimen Description BLOOD RIGHT HAND  Final   Special Requests   Final    BOTTLES DRAWN AEROBIC AND ANAEROBIC Blood Culture adequate volume   Culture   Final    NO GROWTH 5 DAYS Performed at [facility], [place] [address] [place], [place] [address]    Report Status 09/01/2019 FINAL  Final  Culture, blood (routine x 2)     Status: None   Collection Time: 08/27/19 12:20 PM   Specimen: BLOOD RIGHT HAND  Result Value Ref Range Status   Specimen Description BLOOD RIGHT HAND  Final   Special Requests   Final    BOTTLES DRAWN AEROBIC ONLY Blood Culture results may not be optimal due to an inadequate volume of blood received in culture bottles   Culture   Final    NO GROWTH 5 DAYS Performed at [facility], [place] [address], [place], [place] [address]    Report Status 09/01/2019 FINAL  Final  Culture, blood (routine x 2)     Status: None   Collection Time: 08/31/19  8:50 AM   Specimen: BLOOD RIGHT HAND  Result Value Ref Range Status   Specimen Description BLOOD RIGHT HAND  Final   Special Requests   Final    BOTTLES DRAWN AEROBIC ONLY Blood Culture  results may not be optimal due to an inadequate volume of blood received in culture bottles   Culture   Final    NO GROWTH 5 DAYS Performed at [facility], [place]  [address], [place], [place] [address]    Report Status 09/05/2019 FINAL  Final  Culture, blood (routine x 2)     Status: None   Collection Time: 08/31/19  9:01 AM   Specimen: BLOOD LEFT HAND  Result Value Ref Range Status   Specimen Description BLOOD LEFT HAND  Final   Special Requests   Final    BOTTLES DRAWN AEROBIC ONLY Blood Culture results may not be optimal due to an inadequate volume of blood received in culture bottles   Culture   Final    NO GROWTH 5 DAYS Performed at [facility], [place] [address], [place], [place] [address]    Report Status 09/05/2019 FINAL  Final  Culture, respiratory (non-expectorated)     Status: None   Collection Time: 08/31/19  9:05 AM   Specimen: Tracheal Aspirate; Respiratory  Result Value Ref Range Status   Specimen Description TRACHEAL ASPIRATE  Final   Special Requests NONE  Final   Gram Stain   Final    NO WBC SEEN FEW GRAM NEGATIVE RODS RARE GRAM POSITIVE COCCI RARE GRAM POSITIVE RODS RARE GRAM VARIABLE ROD Performed at [facility], [place] [address], [place], [place] [address]    Culture RARE PSEUDOMONAS AERUGINOSA  Final   Report Status 09/02/2019 FINAL  Final   Organism ID, Bacteria PSEUDOMONAS AERUGINOSA  Final      Susceptibility   Pseudomonas aeruginosa - MIC*    CEFTAZIDIME <=1 SENSITIVE Sensitive     CIPROFLOXACIN <=0.25 SENSITIVE Sensitive     GENTAMICIN <=1 SENSITIVE Sensitive     IMIPENEM 2 SENSITIVE Sensitive     PIP/TAZO <=4 SENSITIVE Sensitive     CEFEPIME <=1 SENSITIVE Sensitive     * RARE PSEUDOMONAS AERUGINOSA  Aerobic Culture (superficial specimen)     Status: None   Collection Time: 09/04/19  8:22 AM   Specimen: Wound  Result Value Ref Range Status   Specimen Description WOUND  Final   Special Requests STERNAL WOUND SPEC A  Final    Gram Stain NO WBC SEEN NO ORGANISMS SEEN   Final   Culture   Final    NO GROWTH 2 DAYS Performed at [facility], [address], [place], [place] [address]    Report Status 09/06/2019 FINAL  Final  Culture, blood (routine x 2)     Status: None   Collection Time: 09/08/19  9:23 AM   Specimen: BLOOD LEFT HAND  Result Value Ref Range Status   Specimen Description BLOOD LEFT HAND  Final   Special Requests   Final    BOTTLES DRAWN AEROBIC ONLY Blood Culture adequate volume   Culture   Final    NO GROWTH 5 DAYS Performed at [facility], [place] [address] [place], [place] [address]    Report Status 09/13/2019 FINAL  Final  Culture, blood (routine x 2)     Status: None   Collection Time: 09/08/19  9:27 AM   Specimen: BLOOD RIGHT HAND  Result Value Ref Range Status   Specimen Description BLOOD RIGHT HAND  Final   Special Requests   Final    BOTTLES DRAWN AEROBIC ONLY Blood Culture adequate volume   Culture   Final    NO GROWTH 5 DAYS Performed at [facility], [place] [address] [place],  [address]    Report Status 09/13/2019 FINAL  Final  Culture, respiratory (non-expectorated)     Status: None   Collection Time: 09/08/19  3:17 PM   Specimen: Tracheal Aspirate; Respiratory  Result Value Ref Range Status   Specimen Description TRACHEAL ASPIRATE  Final   Special Requests NONE  Final   Gram Stain NO RBC SEEN RARE GRAM POSITIVE COCCI IN PAIRS   Final   Culture   Final    Consistent with normal respiratory flora. Performed at [facility], [place] [address] [place], [place] [address]    Report Status 09/11/2019 FINAL  Final  Aerobic/Anaerobic Culture (surgical/deep wound)     Status: None   Collection Time: 09/14/19  9:17 AM   Specimen: Soft Tissue, Other  Result Value Ref Range Status   Specimen Description TISSUE  Final   Special Requests HEMATOMA FROM CHEST SPEC A  Final   Gram Stain   Final    FEW WBC PRESENT, PREDOMINANTLY PMN NO ORGANISMS  SEEN    Culture   Final    No growth aerobically or anaerobically. Performed at [facility], [place] [address] [place], [place] [address]    Report Status 09/19/2019 FINAL  Final  Surgical PCR screen     Status: None   Collection Time: 09/23/19  2:48 PM   Specimen: Nasal Mucosa; Nasal Swab  Result Value Ref Range Status   MRSA, PCR NEGATIVE NEGATIVE Final   Staphylococcus aureus NEGATIVE NEGATIVE Final    Comment: (NOTE) The Xpert SA Assay (FDA approved for NASAL specimens in patients 79 years of age and older), is one component of a comprehensive surveillance program. It is not intended to diagnose infection nor to guide or monitor treatment. Performed at [facility], [place] [address], [place], [place] [address]     Coagulation Studies: No results for input(s): LABPROT, INR in the last 72 hours.  Urinalysis: No results for input(s): COLORURINE, LABSPEC, PHURINE, GLUCOSEU, HGBUR, BILIRUBINUR, KETONESUR, PROTEINUR, UROBILINOGEN, NITRITE, LEUKOCYTESUR in the last 72 hours.  Invalid input(s): APPERANCEUR    Imaging: No results found.   Medications:   . sodium chloride Stopped (09/14/19 0802)  . lactated ringers 20 mL/hr at 08/20/19 1200   .  stroke: mapping our early stages of recovery book   Does not apply Once  . sodium chloride   Intravenous Once  . amiodarone  200 mg Per Tube Daily  . arformoterol  15 mcg Nebulization BID  . aspirin  81 mg Per Tube Daily  . atorvastatin  40 mg Per Tube Daily  . bisacodyl  10 mg Rectal Daily  . chlorhexidine  15 mL Mouth Rinse BID  . Chlorhexidine Gluconate Cloth  6 each Topical Q0600  . Chlorhexidine Gluconate Cloth  6 each Topical Q0600  . clonazePAM  0.5 mg Per Tube BID  . darbepoetin (ARANESP) injection - DIALYSIS  100 mcg Intravenous Q Tue-HD  . feeding supplement (NEPRO CARB STEADY)  720 mL Per Tube Q24H  . feeding supplement (PRO-STAT SUGAR FREE 64)  60 mL Per Tube BID  . insulin aspart  0-15 Units Subcutaneous  Q4H  . insulin detemir  12 Units Subcutaneous BID  . mouth rinse  15 mL Mouth Rinse q12n4p  . multivitamin  1 tablet Per Tube QHS  . nystatin  5 mL Oral QID  . pantoprazole  40 mg Oral Daily  . revefenacin  175 mcg Nebulization Daily  . sertraline  25 mg Per Tube Daily  . sevelamer carbonate  1.6 g Per Tube TID WC  . trospium  20 mg Per Tube BID   acetaminophen (TYLENOL) oral  liquid 160 mg/5 mL, albuterol, dextrose, Gerhardt's butt cream, guaiFENesin, hydrALAZINE, HYDROcodone-acetaminophen, hydrocortisone cream, HYDROmorphone (DILAUDID) injection, HYDROmorphone (DILAUDID) injection, ondansetron (ZOFRAN) IV, ondansetron (ZOFRAN) IV, oxyCODONE, phenol, Resource ThickenUp Clear, sennosides, sodium chloride flush  Assessment/ Plan:   Acute kidney injury status post cardiac catheterization, CABG, cardiogenic shock and cardiac arrest.  Started CRRT 08/24/2019 until 09/06/2019 for volume control.  Now tolerating intermittent dialysis.  His last dialysis treatment was 6/15 with 1.3 L ultrafiltration tolerated well continues to be anuric.  Watching for recovery.  Plan for dialysis 10/12/2019.  Appreciate assistance from renal navigator.  Phone conversation with Dr. Royce Macadamia medical director [facility] 10/10/2019, ongoing concerns about patient's appropriateness for outpatient dialysis.  Appreciate assistance from physical therapy Ms. [employer].  Patient was seen in dialysis 10/10/2019, patient on foam mattress and not sitting for treatment.  We will need to be sure that the patient can sit and maintain this position for the entirety of her dialysis treatment.  Ongoing discussions will be had with Dr. Royce Macadamia.  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  Appears to be tolerating well with dialysis.  .  Chest x-ray does not show any significant pulmonary congestion 10/09/2019  Coronary artery disease  status post CABG complicated by cardiac tamponade status post reopening chest urgently 08/18/2019 with wound VAC.  Most recently went back to the OR on 5/12 and 09/14/2019.  Continues on aspirin and atorvastatin  Atrial fibrillation per primary service  Last 2D echo showed systolic heart failure 8/46/6599 ejection fraction of about 40 to 45%.   LOS: [place] @TODAY @6 :25 AM

## 2019-10-11 NOTE — Plan of Care (Signed)
  Problem: Education: Goal: Knowledge of General Education information will improve Description: Including pain rating scale, medication(s)/side effects and non-pharmacologic comfort measures Outcome: Progressing   Problem: Clinical Measurements: Goal: Diagnostic test results will improve Outcome: Progressing Goal: Respiratory complications will improve Outcome: Progressing   Problem: Nutrition: Goal: Adequate nutrition will be maintained Outcome: Progressing   Problem: Coping: Goal: Level of anxiety will decrease Outcome: Progressing   Problem: Pain Managment: Goal: General experience of comfort will improve Outcome: Progressing   Problem: Safety: Goal: Ability to remain free from injury will improve Outcome: Progressing

## 2019-10-11 NOTE — Progress Notes (Signed)
[address]       [place] [address]             [phone]      27 Days Post-Op Procedure(s) (LRB): EVACUATION OF HEMATOMA (N/A) Subjective: Only c/o is soreness from GTube  Objective: Vital signs in last 24 hours: Temp:  [97.9 F (36.6 C)-98.7 F (37.1 C)] 98.1 F (36.7 C) (06/16 0703) Pulse Rate:  [104-109] 106 (06/16 0703) Cardiac Rhythm: Sinus tachycardia (06/16 0704) Resp:  [14-23] 16 (06/16 0703) BP: (90-147)/(35-72) 147/69 (06/16 0703) SpO2:  [96 %-100 %] 99 % (06/16 0703) FiO2 (%):  [21 %] 21 % (06/15 1950)  Hemodynamic parameters for last 24 hours:    Intake/Output from previous day: 06/15 0701 - 06/16 0700 In: 646 [P.O.:320; NG/GT:326] Out: 1306  Intake/Output this shift: No intake/output data recorded.  General appearance: alert, cooperative and no distress Heart: regular rate and rhythm Lungs: dim in bases Abdomen: benign Extremities: minor ankle edema Wound: stable, some drainage from radial incis but no erethema  Lab Results: Recent Labs    10/10/19 0500  WBC 16.2*  HGB 9.4*  HCT 29.4*  PLT 406*   BMET:  Recent Labs    10/10/19 0500 10/11/19 0310  NA 126* 134*  K 4.4 3.8  CL 87* 95*  CO2 23 22  GLUCOSE 143* 200*  BUN 128* 59*  CREATININE 6.91* 4.25*  CALCIUM 9.3 9.2    PT/INR: No results for input(s): LABPROT, INR in the last 72 hours. ABG    Component Value Date/Time   PHART 7.481 (H) 09/07/2019 0029   HCO3 23.3 09/07/2019 0029   TCO2 24 09/07/2019 0029   ACIDBASEDEF 5.0 (H) 08/18/2019 2341   O2SAT 99.0 09/07/2019 0029   CBG (last 3)  Recent Labs    10/10/19 1932 10/10/19 2302 10/11/19 0418  GLUCAP 160* 140* 205*    Meds Scheduled Meds: .  stroke: mapping our early stages of recovery book   Does not apply Once  . sodium chloride   Intravenous Once  . amiodarone  200 mg Per Tube Daily  . arformoterol  15 mcg Nebulization BID  . aspirin  81 mg Per Tube Daily  . atorvastatin  40 mg Per  Tube Daily  . bisacodyl  10 mg Rectal Daily  . chlorhexidine  15 mL Mouth Rinse BID  . Chlorhexidine Gluconate Cloth  6 each Topical Q0600  . Chlorhexidine Gluconate Cloth  6 each Topical Q0600  . Chlorhexidine Gluconate Cloth  6 each Topical Q0600  . clonazePAM  0.5 mg Per Tube BID  . darbepoetin (ARANESP) injection - DIALYSIS  100 mcg Intravenous Q Tue-HD  . feeding supplement (NEPRO CARB STEADY)  720 mL Per Tube Q24H  . feeding supplement (PRO-STAT SUGAR FREE 64)  60 mL Per Tube BID  . insulin aspart  0-15 Units Subcutaneous Q4H  . insulin detemir  12 Units Subcutaneous BID  . mouth rinse  15 mL Mouth Rinse q12n4p  . multivitamin  1 tablet Per Tube QHS  . nystatin  5 mL Oral QID  . pantoprazole  40 mg Oral Daily  . revefenacin  175 mcg Nebulization Daily  . sertraline  25 mg Per Tube Daily  . sevelamer carbonate  1.6 g Per Tube TID WC  . trospium  20 mg Per Tube BID   Continuous Infusions: . sodium chloride Stopped (09/14/19 0802)  . lactated ringers 20 mL/hr at 08/20/19 1200   PRN Meds:.acetaminophen (  TYLENOL) oral liquid 160 mg/5 mL, albuterol, dextrose, Gerhardt's butt cream, guaiFENesin, hydrALAZINE, HYDROcodone-acetaminophen, hydrocortisone cream, HYDROmorphone (DILAUDID) injection, HYDROmorphone (DILAUDID) injection, ondansetron (ZOFRAN) IV, ondansetron (ZOFRAN) IV, oxyCODONE, phenol, Resource ThickenUp Clear, sennosides, sodium chloride flush  Xrays No results found.  Assessment/Plan: S/P Procedure(s) (LRB): EVACUATION OF HEMATOMA (N/A)  1 fairly stable on current management 2 cont to push rehab and nutrition as able 3 renal issues/HD as per nephrology 4 placement proving to be difficult   LOS: 63 days    John Giovanni PA-C Pager [phone] 10/11/2019

## 2019-10-11 NOTE — Progress Notes (Signed)
  Speech Language Pathology Treatment: Dysphagia  Patient Details Name: Christian [name] MRN: [id number] DOB: [birth year] Today's Date: 10/11/2019 Time: 4650-3546 SLP Time Calculation (min) (ACUTE ONLY): 24 min  Assessment / Plan / Recommendation Clinical Impression  Pt was seen for dysphagia treatment and was cooperative throughout the session. He reported that he wants thin water. Pt consumed thin liquids without overt s/sx of aspiration. He and his wife were educated regarding [employer], the rationale behind it, and the associated risk of aspirating thin liquids which are not water. His wife verbalized understanding. Pt was re-educated on the results of the instrumental swallowing assessments and the purpose of dysphagia exercises. 74 verbalized understanding but stated that he has had swallowing difficulty for years and has received acupuncture for it. Pt stated that he was tired and therefore requested that dysphagia exercised be deferred today. A repeat instrumental assessment may be warranted in the near future to re-assess swallow function and determine safety of a more advanced diet. Pt may have thin water between following oral care. SLP will continue to follow pt.    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 4/23 cardiac arrest, 4/29 initiated CRRT, vent s/p trach. 5/10 additional sternal washout unable to close.  5/12 sternal plating with pectoralis flap and omental harvest to mediastinum off CRRT with iHD started. Chest wound closed. Neurology consulted for left sided weakness- CTH neg 09/08/19. 5/20 pt with emergent hematoma evacuation and transition to trach collar; 5/28 downsized to #4 cuffless; ultimately decannulated. PMHx: BPH, CAD, DM, HLD, HTN Had MBS on 09/30/19, weakness, poor visualization of airway, could not recommend thin liquids. FEES repeated to determine best diet textures. PEG placed on  10/04/19. Trach decannulated on 10/05/19.      SLP Plan  Continue with current plan of care       Recommendations  Diet recommendations: Dysphagia 3 (mechanical soft);Nectar-thick liquid (Thin water & ice chips between meals after oral care) Liquids provided via: Cup;Straw Medication Administration: Whole meds with puree Supervision: Full supervision/cueing for compensatory strategies Compensations: Slow rate;Small sips/bites;Follow solids with liquid Postural Changes and/or Swallow Maneuvers: Upright 30-60 min after meal;Seated upright 90 degrees                Oral Care Recommendations: Oral care BID Follow up Recommendations: 24 hour supervision/assistance SLP Visit Diagnosis: Dysphagia, oropharyngeal phase (R13.12) Plan: Continue with current plan of care       Christian [name], [place], [place] Office number [phone] Pager [phone]                Christian [name] 10/11/2019, 5:47 PM

## 2019-10-11 NOTE — Progress Notes (Addendum)
Occupational Therapy Treatment Patient Details Name: Christian [name] MRN: [id number] DOB: [birth year] Today's Date: 10/11/2019    History of present illness Pt is a 74 y.o. admitted 08/10/19 with STEMI, RLL PE. S/p CABGx4 on 4/20, remained open with wound VAC with repeated washouts. 4/23 cardiac arrest, 4/29 initiated CRRT, vent s/p trach. 5/10 additional sternal washout unable to close. 5/12 sternal plating with pectoralis flap and omental harvest to mediastinum; off CRRT with iHD started. Chest wound closed. Neurology consulted for acute L-sided weakness- head CT 5/14 negative for acute injury. 5/20 pt with emergent hematoma evacuation and transition to trach collar; 5/24 downsized to 6 trach; 5/28 downsized to cuffless trach. PEG tube placed 6/9. Trach decannulated 6/10. PMH includes BPH, CAD, DM, HLD, HTN   OT comments  Pt with slow progress towards OT goals, reports fatigue today and requiring some encouragement to participate in OT session. Pt notably slumped over towards his R side while upright in bed - assisted with repositioning and use of bolster to reduce R lateral lean while seated upright. Pt engaged in LE strengthening/ROM during session and reports goal of wanting to move his L leg more (as precursor to walking). VSS throughout. Will continue per POC at this time.    Follow Up Recommendations  LTACH;SNF;Supervision/Assistance - 24 hour    Equipment Recommendations  Other (comment);Hospital bed;Wheelchair (measurements OT);Wheelchair cushion (measurements OT) (TBD)          Precautions / Restrictions Precautions Precautions: Fall;Sternal;Other (comment) Precaution Comments: Ostomy, PEG Restrictions RUE Partial Weight Bearing Percentage or Pounds: sternal precautions  LUE Partial Weight Bearing Percentage or Pounds: sternal precautions       Mobility Bed Mobility Overal bed mobility: Needs Assistance             General bed mobility comments: +2 to boost to [facility] as  pt with significant R lateral lean upon arrival, repositioned in upright position with bolster to R side to promote more upright, pt declined sitting EOB  Transfers Overall transfer level: Needs assistance               General transfer comment: pt declined standing with Tilt bed today due to fatigue     Balance                                           ADL either performed or assessed with clinical judgement   ADL Overall ADL's : Needs assistance/impaired Eating/Feeding: Minimal assistance;Sitting Eating/Feeding Details (indicate cue type and reason): RN okay for pt to have ice chips, assisted with feeding few ice chips to ensure safety                                   General ADL Comments: pt reports fatigued today and intiailly required encouragement to participate - when asked what his goals for therapy are he reports wishing to be able to move his LLE more so that he can walk                        Cognition Arousal/Alertness: Awake/alert Behavior During Therapy: WFL for tasks assessed/performed Overall Cognitive Status: Impaired/Different from baseline Area of Impairment: Safety/judgement;Awareness  Safety/Judgement: Decreased awareness of deficits Awareness: Emergent   General Comments: pt frustrated with not being able to have "a cold drink" today - educated in need to work with SLP to progress swallowing needs but pt only minimally receptive to education;         Exercises Exercises: General Lower Extremity;Other exercises;General Upper Extremity Total Joint Exercises Ankle Circles/Pumps: AROM;Both;15 reps General Exercises - Lower Extremity Heel Slides: AAROM;Left;10 reps Straight Leg Raises: AAROM;Left;10 reps Hip Flexion/Marching: AAROM;Left;10 reps Low Level/ICU Exercises Stabilized Bridging: AROM;Supine   Shoulder Instructions       General Comments      Pertinent Vitals/  Pain       Pain Assessment: Faces Faces Pain Scale: Hurts little more Pain Location: abdomen at PEG site Pain Descriptors / Indicators: Grimacing;Discomfort Pain Intervention(s): Monitored during session;Repositioned  Home Living                                          Prior Functioning/Environment              Frequency  Min 2X/week        Progress Toward Goals  OT Goals(current goals can now be found in the care plan section)  Progress towards OT goals: Progressing toward goals (slowly)  Acute Rehab OT Goals Patient Stated Goal: return to independence OT Goal Formulation: With patient Time For Goal Achievement: 10/25/19 Potential to Achieve Goals: [place] Discharge plan remains appropriate    Co-evaluation                 AM-PAC OT "6 Clicks" Daily Activity     Outcome Measure   Help from another person eating meals?: A Lot Help from another person taking care of personal grooming?: A Lot Help from another person toileting, which includes using toliet, bedpan, or urinal?: Total Help from another person bathing (including washing, rinsing, drying)?: A Lot Help from another person to put on and taking off regular upper body clothing?: A Lot Help from another person to put on and taking off regular lower body clothing?: Total 6 Click Score: 10    End of Session    OT Visit Diagnosis: Unsteadiness on feet (R26.81);Muscle weakness (generalized) (M62.81);Pain Pain - part of body:  (abdomen)   Activity Tolerance Patient limited by fatigue;Patient tolerated treatment well   Patient Left in bed;with call bell/phone within reach   Nurse Communication Mobility status        Time: 6962-9528 OT Time Calculation (min): 38 min  Charges: OT General Charges $OT Visit: 1 Visit OT Treatments $Self Care/Home Management : 8-22 mins $Therapeutic Activity: 23-37 mins  Lou Cal, OT Acute Rehabilitation Services Pager  [phone] Office [phone]   [name] 10/11/2019, 4:49 PM

## 2019-10-12 LAB — MAGNESIUM: Magnesium: 2.5 mg/dL — ABNORMAL HIGH (ref 1.7–2.4)

## 2019-10-12 LAB — RENAL FUNCTION PANEL
Albumin: 2.5 g/dL — ABNORMAL LOW (ref 3.5–5.0)
Anion gap: 14 (ref 5–15)
BUN: 95 mg/dL — ABNORMAL HIGH (ref 8–23)
CO2: 24 mmol/L (ref 22–32)
Calcium: 9.4 mg/dL (ref 8.9–10.3)
Chloride: 93 mmol/L — ABNORMAL LOW (ref 98–111)
Creatinine, Ser: 5.66 mg/dL — ABNORMAL HIGH (ref 0.61–1.24)
GFR calc Af Amer: 11 mL/min — ABNORMAL LOW (ref >60)
GFR calc non Af Amer: 9 mL/min — ABNORMAL LOW (ref >60)
Glucose, Bld: 161 mg/dL — ABNORMAL HIGH (ref 70–99)
Phosphorus: 5.4 mg/dL — ABNORMAL HIGH (ref 2.5–4.6)
Potassium: 3.9 mmol/L (ref 3.5–5.1)
Sodium: 131 mmol/L — ABNORMAL LOW (ref 135–145)

## 2019-10-12 LAB — CBC
HCT: 30.3 % — ABNORMAL LOW (ref 39.0–52.0)
Hemoglobin: 9.3 g/dL — ABNORMAL LOW (ref 13.0–17.0)
MCH: 31 pg (ref 26.0–34.0)
MCHC: 30.7 g/dL (ref 30.0–36.0)
MCV: 101 fL — ABNORMAL HIGH (ref 80.0–100.0)
Platelets: 369 10*3/uL (ref 150–400)
RBC: 3 MIL/uL — ABNORMAL LOW (ref 4.22–5.81)
RDW: 18.9 % — ABNORMAL HIGH (ref 11.5–15.5)
WBC: 14.6 10*3/uL — ABNORMAL HIGH (ref 4.0–10.5)
nRBC: 0 % (ref 0.0–0.2)

## 2019-10-12 LAB — GLUCOSE, CAPILLARY
Glucose-Capillary: 122 mg/dL — ABNORMAL HIGH (ref 70–99)
Glucose-Capillary: 122 mg/dL — ABNORMAL HIGH (ref 70–99)
Glucose-Capillary: 170 mg/dL — ABNORMAL HIGH (ref 70–99)
Glucose-Capillary: 189 mg/dL — ABNORMAL HIGH (ref 70–99)

## 2019-10-12 MED ORDER — HEPARIN SODIUM (PORCINE) 1000 UNIT/ML IJ SOLN
INTRAMUSCULAR | Status: AC
  Administered 2019-10-12: 4200 [IU]
  Filled 2019-10-12: qty 4

## 2019-10-12 MED ORDER — HYDROMORPHONE HCL 1 MG/ML IJ SOLN
INTRAMUSCULAR | Status: AC
  Administered 2019-10-12: 1 mg via INTRAVENOUS
  Filled 2019-10-12: qty 1

## 2019-10-12 NOTE — Progress Notes (Signed)
  Speech Language Pathology Treatment: Dysphagia  Patient Details Name: Christian [name] MRN: [id number] DOB: 10-24-1945 Today's Date: 10/12/2019 Time: 4098-1191 SLP Time Calculation (min) (ACUTE ONLY): 20 min  Assessment / Plan / Recommendation Clinical Impression  Pt was seen for dysphagia treatment but requested that p.o. intake be deferred since he had recently eaten. Pt denied any signs of aspiration with intake of thin water. He required re-education as to why he can only have thin water, why oral care is necessary prior to intake of thin water, and why thickened juices are still recommended. Pt verbalized understanding but still subsequently stated that he continues to get conflicting information regarding which liquids he can have. A modified barium swallow study is recommended to assess his ability to safely have thin liquids. Pt and his wife were educated regarding the benefits of the study and the study was described at length. Both parties verbalized understanding and all of the wife's questions were answered to her satisfaction.    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 4/23 cardiac arrest, 4/29 initiated CRRT, vent s/p trach. 5/10 additional sternal washout unable to close.  5/12 sternal plating with pectoralis flap and omental harvest to mediastinum off CRRT with iHD started. Chest wound closed. Neurology consulted for left sided weakness- CTH neg 09/08/19. 5/20 pt with emergent hematoma evacuation and transition to trach collar; 5/28 downsized to #4 cuffless; ultimately decannulated. PMHx: BPH, CAD, DM, HLD, HTN Had MBS on 09/30/19, weakness, poor visualization of airway, could not recommend thin liquids. FEES repeated to determine best diet textures. PEG placed on 10/04/19. Trach decannulated on 10/05/19.      SLP Plan  Continue with current plan of care       Recommendations  Diet recommendations: Dysphagia  3 (mechanical soft);Nectar-thick liquid Liquids provided via: Cup;Straw Medication Administration: Whole meds with puree Supervision: Full supervision/cueing for compensatory strategies Compensations: Slow rate;Small sips/bites;Follow solids with liquid Postural Changes and/or Swallow Maneuvers: Upright 30-60 min after meal;Seated upright 90 degrees                Oral Care Recommendations: Oral care BID Follow up Recommendations: 24 hour supervision/assistance SLP Visit Diagnosis: Dysphagia, oropharyngeal phase (R13.12) Plan: Continue with current plan of care       Davie Sagona I. Hardin Negus, [place], [place] Office number [phone] Pager [place] 10/12/2019, 5:49 PM

## 2019-10-12 NOTE — Progress Notes (Signed)
[facility] ROUNDING NOTE   Subjective:   This is a 74 year old gentleman status post CABG status post cardiac arrest due to tamponade developed acute kidney injury receiving dialysis.  Previous creatinine was 1.17 July 2019.  Started on CRRT 08/24/2019 to 09/06/2019 to manage volume.  Now tolerating intermittent hemodialysis through a right IJ TDC placed by interventional radiology 09/19/2019.  Physical therapy assessment appreciate assistance from [facility] recommendations for SNF placement.  Patient beyond level of care that would be provided by LTAC.  Last dialysis 10/10/2019 with ultrafiltration of 1.3 L.  Urine output appears to be minimal.  Blood pressure 137/77 pulse 104 temperature 98 O2 sats 96% room air   Sodium 131 potassium 3.9 chloride 93 CO2 24 BUN 25 creatinine 5.66 glucose 161 calcium 9.4 phosphorus 5.4 albumin 2.5 hemoglobin 9.4  Last chest x-ray 10/08/2019 small left   pleural effusion  Aspirin 81 mg daily atorvastatin 40 mg daily, darbepoetin 100 mcg every Tuesday last administered 10/03/2019, insulin sliding scale, Levemir 12 units twice daily, midodrine 5 mg 3 times daily, Protonix 40 mg daily, Renvela 1.6 g 3 times daily, Sanctura 20 mg twice daily  Objective:  Vital signs in last 24 hours:  Temp:  [98 F (36.7 C)-98.8 F (37.1 C)] 98 F (36.7 C) (06/17 0339) Pulse Rate:  [99-106] 104 (06/17 0339) Resp:  [15-20] 15 (06/17 0339) BP: (129-147)/(67-77) 133/77 (06/17 0339) SpO2:  [99 %-100 %] 100 % (06/17 0339)  Weight change:  Filed Weights   10/07/19 0414 10/10/19 0500 10/10/19 0745  Weight: 83 kg 84.4 kg 84.4 kg    Intake/Output: I/O last 3 completed shifts: In: 7989 [P.O.:680; NG/GT:966] Out: 1306 [Other:1306]   Intake/Output this shift:  Total I/O In: 570 [Other:30; NG/GT:540] Out: 62 [Urine:80]  General:NAD, not in distress, lying on bed comfortable Heart:RRR, s1s2 nl no rubs Lungs: Clear bilateral, no wheezing Abdomen:soft,  Non-tender, non-distended, gastric feeding tube Extremities:No edema Dialysis Access: Right IJ TDC, clean site with no evidence of bleeding.   Basic Metabolic Panel: Recent Labs  Lab 10/08/19 0248 10/08/19 0248 10/09/19 1054 10/09/19 1054 10/10/19 0500 10/11/19 0310 10/12/19 0248  NA 133*  --  127*  --  126* 134* 131*  K 3.9  --  4.0  --  4.4 3.8 3.9  CL 95*  --  88*  --  87* 95* 93*  CO2 24  --  22  --  23 22 24   GLUCOSE 152*  --  180*  --  143* 200* 161*  BUN 57*  --  103*  --  128* 59* 95*  CREATININE 3.97*  --  5.88*  --  6.91* 4.25* 5.66*  CALCIUM 9.2   < > 9.5   < > 9.3 9.2 9.4  MG 2.3  --  2.5*  --  2.8* 2.3 2.5*  PHOS 4.5  --  5.3*  --  6.7* 5.3* 5.4*   < > = values in this interval not displayed.    Liver Function Tests: Recent Labs  Lab 10/08/19 0248 10/09/19 1054 10/10/19 0500 10/11/19 0310 10/12/19 0248  ALBUMIN 2.7* 2.7* 2.5* 2.4* 2.5*   No results for input(s): LIPASE, AMYLASE in the last 168 hours. No results for input(s): AMMONIA in the last 168 hours.  CBC: Recent Labs  Lab 10/06/19 0316 10/07/19 1335 10/10/19 0500  WBC 17.1* 18.1* 16.2*  HGB 11.1* 9.7* 9.4*  HCT 35.4* 31.2* 29.4*  MCV 99.7 100.3* 98.7  PLT 479* 472* 406*    Cardiac  Enzymes: No results for input(s): CKTOTAL, CKMB, CKMBINDEX, TROPONINI in the last 168 hours.  BNP: Invalid input(s): POCBNP  CBG: Recent Labs  Lab 10/11/19 1142 10/11/19 1648 10/11/19 2004 10/11/19 2333 10/12/19 0336  GLUCAP 176* 184* 138* 146* 170*    Microbiology: Results for orders placed or performed during the hospital encounter of 08/09/19  Respiratory Panel by RT PCR (Flu A&B, Covid) - Nasopharyngeal Swab     Status: None   Collection Time: 08/09/19  3:31 PM   Specimen: Nasopharyngeal Swab  Result Value Ref Range Status   SARS Coronavirus 2 by RT PCR NEGATIVE NEGATIVE Final    Comment: (NOTE) SARS-CoV-2 target nucleic acids are NOT DETECTED. The SARS-CoV-2 RNA is generally detectable  in upper respiratoy specimens during the acute phase of infection. The lowest concentration of SARS-CoV-2 viral copies this assay can detect is 131 copies/mL. A negative result does not preclude SARS-Cov-2 infection and should not be used as the sole basis for treatment or other patient management decisions. A negative result may occur with  improper specimen collection/handling, submission of specimen other than nasopharyngeal swab, presence of viral mutation(s) within the areas targeted by this assay, and inadequate number of viral copies (<131 copies/mL). A negative result must be combined with clinical observations, patient history, and epidemiological information. The expected result is Negative. Fact Sheet for Patients:  PinkCheek.be Fact Sheet for Healthcare Providers:  GravelBags.it This test is not yet ap proved or cleared by the [place] FDA and  has been authorized for detection and/or diagnosis of SARS-CoV-2 by FDA under an Emergency Use Authorization (EUA). This EUA will remain  in effect (meaning this test can be used) for the duration of the COVID-19 declaration under Section 564(b)(1) of the Act, 21 U.S.C. section 360bbb-3(b)(1), unless the authorization is terminated or revoked sooner.    Influenza A by PCR NEGATIVE NEGATIVE Final   Influenza B by PCR NEGATIVE NEGATIVE Final    Comment: (NOTE) The Xpert Xpress SARS-CoV-2/FLU/RSV assay is intended as an aid in  the diagnosis of influenza from Nasopharyngeal swab specimens and  should not be used as a sole basis for treatment. Nasal washings and  aspirates are unacceptable for Xpert Xpress SARS-CoV-2/FLU/RSV  testing. Fact Sheet for Patients: PinkCheek.be Fact Sheet for Healthcare Providers: GravelBags.it This test is not yet approved or cleared by the [place] FDA and  has been authorized for  detection and/or diagnosis of SARS-CoV-2 by  FDA under an Emergency Use Authorization (EUA). This EUA will remain  in effect (meaning this test can be used) for the duration of the  Covid-19 declaration under Section 564(b)(1) of the Act, 21  U.S.C. section 360bbb-3(b)(1), unless the authorization is  terminated or revoked. Performed at [facility], [place] [address] [place], [place] [address]   Surgical pcr screen     Status: None   Collection Time: 08/14/19 10:20 PM   Specimen: Nasal Mucosa; Nasal Swab  Result Value Ref Range Status   MRSA, PCR NEGATIVE NEGATIVE Final   Staphylococcus aureus NEGATIVE NEGATIVE Final    Comment: (NOTE) The Xpert SA Assay (FDA approved for NASAL specimens in patients 45 years of age and older), is one component of a comprehensive surveillance program. It is not intended to diagnose infection nor to guide or monitor treatment. Performed at [facility], [place] [address], [place], [place] [address]   Culture, blood (routine x 2)     Status: None   Collection Time: 08/17/19 11:32 AM  Specimen: BLOOD RIGHT HAND  Result Value Ref Range Status   Specimen Description BLOOD RIGHT HAND  Final   Special Requests   Final    BOTTLES DRAWN AEROBIC AND ANAEROBIC Blood Culture adequate volume   Culture   Final    NO GROWTH 5 DAYS Performed at [facility], [address] [place], [place] [address]    Report Status 08/22/2019 FINAL  Final  Culture, blood (routine x 2)     Status: None   Collection Time: 08/17/19 11:39 AM   Specimen: BLOOD RIGHT HAND  Result Value Ref Range Status   Specimen Description BLOOD RIGHT HAND  Final   Special Requests   Final    BOTTLES DRAWN AEROBIC AND ANAEROBIC Blood Culture results may not be optimal due to an inadequate volume of blood received in culture bottles   Culture   Final    NO GROWTH 5 DAYS Performed at [facility], [place] [address], [place], [place] [address]    Report Status 08/22/2019  FINAL  Final  Culture, respiratory (non-expectorated)     Status: None   Collection Time: 08/27/19  7:42 AM   Specimen: Tracheal Aspirate; Respiratory  Result Value Ref Range Status   Specimen Description TRACHEAL ASPIRATE  Final   Special Requests Immunocompromised  Final   Gram Stain   Final    RARE WBC PRESENT, PREDOMINANTLY PMN RARE GRAM POSITIVE RODS    Culture   Final    FEW Consistent with normal respiratory flora. Performed at [facility], [place] [address] [place], [place] [address]    Report Status 08/29/2019 FINAL  Final  Culture, blood (routine x 2)     Status: None   Collection Time: 08/27/19 10:30 AM   Specimen: BLOOD RIGHT HAND  Result Value Ref Range Status   Specimen Description BLOOD RIGHT HAND  Final   Special Requests   Final    BOTTLES DRAWN AEROBIC AND ANAEROBIC Blood Culture adequate volume   Culture   Final    NO GROWTH 5 DAYS Performed at [facility], [place] [address] [place], [place] [address]    Report Status 09/01/2019 FINAL  Final  Culture, blood (routine x 2)     Status: None   Collection Time: 08/27/19 12:20 PM   Specimen: BLOOD RIGHT HAND  Result Value Ref Range Status   Specimen Description BLOOD RIGHT HAND  Final   Special Requests   Final    BOTTLES DRAWN AEROBIC ONLY Blood Culture results may not be optimal due to an inadequate volume of blood received in culture bottles   Culture   Final    NO GROWTH 5 DAYS Performed at [facility], [place] [address] [place], [place] [address]    Report Status 09/01/2019 FINAL  Final  Culture, blood (routine x 2)     Status: None   Collection Time: 08/31/19  8:50 AM   Specimen: BLOOD RIGHT HAND  Result Value Ref Range Status   Specimen Description BLOOD RIGHT HAND  Final   Special Requests   Final    BOTTLES DRAWN AEROBIC ONLY Blood Culture results may not be optimal due to an inadequate volume of blood received in culture bottles   Culture   Final    NO GROWTH 5 DAYS Performed at  [facility], [place] [address], [place],  [address]    Report Status 09/05/2019 FINAL  Final  Culture, blood (routine x 2)  Status: None   Collection Time: 08/31/19  9:01 AM   Specimen: BLOOD LEFT HAND  Result Value Ref Range Status   Specimen Description BLOOD LEFT HAND  Final   Special Requests   Final    BOTTLES DRAWN AEROBIC ONLY Blood Culture results may not be optimal due to an inadequate volume of blood received in culture bottles   Culture   Final    NO GROWTH 5 DAYS Performed at [facility], [place] [address] [place], [place] [address]    Report Status 09/05/2019 FINAL  Final  Culture, respiratory (non-expectorated)     Status: None   Collection Time: 08/31/19  9:05 AM   Specimen: Tracheal Aspirate; Respiratory  Result Value Ref Range Status   Specimen Description TRACHEAL ASPIRATE  Final   Special Requests NONE  Final   Gram Stain   Final    NO WBC SEEN FEW GRAM NEGATIVE RODS RARE GRAM POSITIVE COCCI RARE GRAM POSITIVE RODS RARE GRAM VARIABLE ROD Performed at [facility], [place] [address], [place], [place] [address]    Culture RARE PSEUDOMONAS AERUGINOSA  Final   Report Status 09/02/2019 FINAL  Final   Organism ID, Bacteria PSEUDOMONAS AERUGINOSA  Final      Susceptibility   Pseudomonas aeruginosa - MIC*    CEFTAZIDIME <=1 SENSITIVE Sensitive     CIPROFLOXACIN <=0.25 SENSITIVE Sensitive     GENTAMICIN <=1 SENSITIVE Sensitive     IMIPENEM 2 SENSITIVE Sensitive     PIP/TAZO <=4 SENSITIVE Sensitive     CEFEPIME <=1 SENSITIVE Sensitive     * RARE PSEUDOMONAS AERUGINOSA  Aerobic Culture (superficial specimen)     Status: None   Collection Time: 09/04/19  8:22 AM   Specimen: Wound  Result Value Ref Range Status   Specimen Description WOUND  Final   Special Requests STERNAL WOUND SPEC A  Final   Gram Stain NO WBC SEEN NO ORGANISMS SEEN   Final   Culture   Final    NO GROWTH 2 DAYS Performed at [facility], [address],  [place], [place] [address]    Report Status 09/06/2019 FINAL  Final  Culture, blood (routine x 2)     Status: None   Collection Time: 09/08/19  9:23 AM   Specimen: BLOOD LEFT HAND  Result Value Ref Range Status   Specimen Description BLOOD LEFT HAND  Final   Special Requests   Final    BOTTLES DRAWN AEROBIC ONLY Blood Culture adequate volume   Culture   Final    NO GROWTH 5 DAYS Performed at [facility], [place] [address], [place],  [address]    Report Status 09/13/2019 FINAL  Final  Culture, blood (routine x 2)     Status: None   Collection Time: 09/08/19  9:27 AM   Specimen: BLOOD RIGHT HAND  Result Value Ref Range Status   Specimen Description BLOOD RIGHT HAND  Final   Special Requests   Final    BOTTLES DRAWN AEROBIC ONLY Blood Culture adequate volume   Culture   Final    NO GROWTH 5 DAYS Performed at [facility], [place] [address], [place],  [address]    Report Status 09/13/2019 FINAL  Final  Culture, respiratory (non-expectorated)     Status: None   Collection Time: 09/08/19  3:17 PM   Specimen: Tracheal Aspirate; Respiratory  Result Value Ref Range Status   Specimen Description TRACHEAL ASPIRATE  Final  Special Requests NONE  Final   Gram Stain NO RBC SEEN RARE GRAM POSITIVE COCCI IN PAIRS   Final   Culture   Final    Consistent with normal respiratory flora. Performed at [facility], [place] [address], [place], [place] [address]    Report Status 09/11/2019 FINAL  Final  Aerobic/Anaerobic Culture (surgical/deep wound)     Status: None   Collection Time: 09/14/19  9:17 AM   Specimen: Soft Tissue, Other  Result Value Ref Range Status   Specimen Description TISSUE  Final   Special Requests HEMATOMA FROM CHEST SPEC A  Final   Gram Stain   Final    FEW WBC PRESENT, PREDOMINANTLY PMN NO ORGANISMS SEEN    Culture   Final    No growth aerobically or anaerobically. Performed at [facility], [place] [address] [place], [place] [address]     Report Status 09/19/2019 FINAL  Final  Surgical PCR screen     Status: None   Collection Time: 09/23/19  2:48 PM   Specimen: Nasal Mucosa; Nasal Swab  Result Value Ref Range Status   MRSA, PCR NEGATIVE NEGATIVE Final   Staphylococcus aureus NEGATIVE NEGATIVE Final    Comment: (NOTE) The Xpert SA Assay (FDA approved for NASAL specimens in patients 67 years of age and older), is one component of a comprehensive surveillance program. It is not intended to diagnose infection nor to guide or monitor treatment. Performed at [facility], [place] [address], [place], [place] [address]     Coagulation Studies: No results for input(s): LABPROT, INR in the last 72 hours.  Urinalysis: No results for input(s): COLORURINE, LABSPEC, PHURINE, GLUCOSEU, HGBUR, BILIRUBINUR, KETONESUR, PROTEINUR, UROBILINOGEN, NITRITE, LEUKOCYTESUR in the last 72 hours.  Invalid input(s): APPERANCEUR    Imaging: No results found.   Medications:   . sodium chloride Stopped (09/14/19 0802)  . lactated ringers 20 mL/hr at 08/20/19 1200   .  stroke: mapping our early stages of recovery book   Does not apply Once  . sodium chloride   Intravenous Once  . amiodarone  200 mg Per Tube Daily  . arformoterol  15 mcg Nebulization BID  . aspirin  81 mg Per Tube Daily  . atorvastatin  40 mg Per Tube Daily  . bisacodyl  10 mg Rectal Daily  . chlorhexidine  15 mL Mouth Rinse BID  . Chlorhexidine Gluconate Cloth  6 each Topical Q0600  . Chlorhexidine Gluconate Cloth  6 each Topical Q0600  . Chlorhexidine Gluconate Cloth  6 each Topical Q0600  . clonazePAM  0.5 mg Per Tube BID  . darbepoetin (ARANESP) injection - DIALYSIS  100 mcg Intravenous Q Tue-HD  . feeding supplement (NEPRO CARB STEADY)  720 mL Per Tube Q24H  . feeding supplement (PRO-STAT SUGAR FREE 64)  60 mL Per Tube BID  . insulin aspart  0-15 Units Subcutaneous Q4H  . insulin detemir  12 Units Subcutaneous BID  . mouth rinse  15 mL Mouth Rinse q12n4p  .  multivitamin  1 tablet Per Tube QHS  . nystatin  5 mL Oral QID  . pantoprazole  40 mg Oral Daily  . revefenacin  175 mcg Nebulization Daily  . sertraline  25 mg Per Tube Daily  . sevelamer carbonate  1.6 g Per Tube TID WC  . trospium  20 mg Per Tube BID   acetaminophen (TYLENOL) oral liquid 160 mg/5 mL, albuterol, dextrose, Gerhardt's butt cream, guaiFENesin, hydrALAZINE, HYDROcodone-acetaminophen, hydrocortisone cream,  HYDROmorphone (DILAUDID) injection, HYDROmorphone (DILAUDID) injection, ondansetron (ZOFRAN) IV, ondansetron (ZOFRAN) IV, oxyCODONE, phenol, Resource ThickenUp Clear, sennosides, sodium chloride flush  Assessment/ Plan:   Acute kidney injury status post cardiac catheterization, CABG, cardiogenic shock and cardiac arrest.  Started CRRT 08/24/2019 until 09/06/2019 for volume control.  Now tolerating intermittent dialysis.  His last dialysis treatment was 6/15 with 1.3 L ultrafiltration tolerated well continues to be anuric.  Watching for recovery.  Plan for dialysis 10/12/2019.  Appreciate assistance from renal navigator.  Appreciate assistance from Dr. Royce Macadamia medical director of [facility].  We will see how patient tolerates reclining wheelchair.  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  Appears to be tolerating well with dialysis.  .  Chest x-ray does not show any significant pulmonary congestion 10/09/2019  Coronary artery disease status post CABG complicated by cardiac tamponade status post reopening chest urgently 08/18/2019 with wound VAC.  Most recently went back to the OR on 5/12 and 09/14/2019.  Continues on aspirin and atorvastatin  Atrial fibrillation per primary service  Last 2D echo showed systolic heart failure 08/21/8339 ejection fraction of about 40 to 45%.   LOS: [place] @TODAY @6 :36 AM

## 2019-10-12 NOTE — Progress Notes (Signed)
Renal Navigator met briefly with patient on HD this morning. He was in recliner, tolerating treatment, BP 110/74, though stated that his bottom was sore. He states that he had his cream applied prior to treatment, but thought maybe it was "wearing off." Patient did appear uncomfortable, though not complaining until asked. He reports, "I can sit in the chair for treatment every time," when Navigator discussed importance of monitoring his ability of tolerating treatment in recliner. Navigator will continue to monitor.   Alphonzo Cruise, [place]  Renal Navigator [phone]

## 2019-10-12 NOTE — Progress Notes (Signed)
Physical Therapy Treatment Patient Details Name: CUTTER PASSEY MRN: [id number] DOB: [birth year] Today's Date: 10/12/2019    History of Present Illness Pt is a 74 y.o. admitted 08/10/19 with STEMI, RLL PE. S/p CABGx4 on 4/20, remained open with wound VAC with repeated washouts. 4/23 cardiac arrest, 4/29 initiated CRRT, vent s/p trach. 5/10 additional sternal washout unable to close. 5/12 sternal plating with pectoralis flap and omental harvest to mediastinum; off CRRT with iHD started. Chest wound closed. Neurology consulted for acute L-sided weakness- head CT 5/14 negative for acute injury. 5/20 pt with emergent hematoma evacuation and transition to trach collar; 5/24 downsized to 6 trach; 5/28 downsized to cuffless trach. PEG tube placed 6/9. Trach decannulated 6/10. PMH includes BPH, CAD, DM, HLD, HTN    PT Comments    Pt complaining of significant PEG site pain and fatigue from dialysis this day, but agreeable to bed level exercises. Pt instructed in exercises he can do in supine, at least partial ROM, as pt needs light PT assist complete exercises especially on L. Pt's wife present and PT encouraged wife to remind pt to perform bed-level exercises daily. PT to progress mobility as able.     Follow Up Recommendations  SNF;Supervision/Assistance - 24 hour     Equipment Recommendations   (defer to next venue)    Recommendations for Other Services       Precautions / Restrictions Precautions Precautions: Fall;Sternal;Other (comment) Precaution Comments: Ostomy, PEG    Mobility  Bed Mobility Overal bed mobility: Needs Assistance             General bed mobility comments: not performed, bedlevel exercises only  Transfers                    Ambulation/Gait                 Stairs             Wheelchair Mobility    Modified Rankin (Stroke Patients Only)       Balance                                            Cognition  Arousal/Alertness: Awake/alert Behavior During Therapy: WFL for tasks assessed/performed Overall Cognitive Status: Within Functional Limits for tasks assessed                                 General Comments: pleasant and cooperative this day      Exercises General Exercises - Lower Extremity Ankle Circles/Pumps: AROM;Both;Supine;15 reps Quad Sets: AROM;Both;20 reps;Supine (with tactile cuing from PT posterior to knee) Heel Slides: AAROM;Both;15 reps;Supine Hip ABduction/ADduction: AAROM;Both;15 reps;Supine    General Comments        Pertinent Vitals/Pain Pain Assessment: Faces Faces Pain Scale: Hurts little more Pain Location: abdomen 2* PEG tube Pain Descriptors / Indicators: Grimacing;Discomfort Pain Intervention(s): Limited activity within patient's tolerance;Monitored during session;Repositioned    Home Living                      Prior Function            PT Goals (current goals can now be found in the care plan section) Acute Rehab PT Goals Patient Stated Goal: return to independence PT Goal Formulation: With patient Time For Goal  Achievement: 10/26/19 Potential to Achieve Goals: Fair Progress towards PT goals: Progressing toward goals    Frequency    Min 2X/week      PT Plan Current plan remains appropriate    Co-evaluation              AM-PAC PT "6 Clicks" Mobility   Outcome Measure  Help needed turning from your back to your side while in a flat bed without using bedrails?: A Lot Help needed moving from lying on your back to sitting on the side of a flat bed without using bedrails?: A Lot Help needed moving to and from a bed to a chair (including a wheelchair)?: Total Help needed standing up from a chair using your arms (e.g., wheelchair or bedside chair)?: Total Help needed to walk in hospital room?: Total Help needed climbing 3-5 steps with a railing? : Total 6 Click Score: 8    End of Session   Activity  Tolerance: Patient tolerated treatment well Patient left: in bed;with call bell/phone within reach;with family/visitor present Nurse Communication: Mobility status PT Visit Diagnosis: Other abnormalities of gait and mobility (R26.89);Muscle weakness (generalized) (M62.81);Other symptoms and signs involving the nervous system (R29.898)     Time: 7782-4235 PT Time Calculation (min) (ACUTE ONLY): 20 min  Charges:  $Therapeutic Exercise: 8-22 mins                     Delfino Friesen E, PT Acute Rehabilitation Services Pager [phone]  Office [phone]   Kamen Hanken D Elonda Husky 10/12/2019, 4:50 PM

## 2019-10-12 NOTE — Progress Notes (Signed)
[address]       [place] [address]             [phone]      28 Days Post-Op Procedure(s) (LRB): EVACUATION OF HEMATOMA (N/A) Subjective: Back from dialysis Progressing with dysphagia therapies SW conts to work on placement  Objective: Vital signs in last 24 hours: Temp:  [98 F (36.7 C)-98.8 F (37.1 C)] 98.4 F (36.9 C) (06/17 0723) Pulse Rate:  [99-109] 109 (06/17 1100) Cardiac Rhythm: Sinus tachycardia (06/17 0340) Resp:  [15-20] 20 (06/17 0723) BP: (97-139)/(64-77) 115/69 (06/17 1100) SpO2:  [98 %-100 %] 98 % (06/17 0723)  Hemodynamic parameters for last 24 hours:    Intake/Output from previous day: 06/16 0701 - 06/17 0700 In: 1975 [P.O.:360; NG/GT:1555] Out: 80 [Urine:80] Intake/Output this shift: No intake/output data recorded.  General appearance: alert, cooperative, fatigued and weak Heart: regular rate and rhythm Lungs: clear anteriorly Abdomen: soft, non tender Extremities: minor ankle edema Wound: incis stable  Lab Results: Recent Labs    10/10/19 0500 10/12/19 0739  WBC 16.2* 14.6*  HGB 9.4* 9.3*  HCT 29.4* 30.3*  PLT 406* 369   BMET:  Recent Labs    10/11/19 0310 10/12/19 0248  NA 134* 131*  K 3.8 3.9  CL 95* 93*  CO2 22 24  GLUCOSE 200* 161*  BUN 59* 95*  CREATININE 4.25* 5.66*  CALCIUM 9.2 9.4    PT/INR: No results for input(s): LABPROT, INR in the last 72 hours. ABG    Component Value Date/Time   PHART 7.481 (H) 09/07/2019 0029   HCO3 23.3 09/07/2019 0029   TCO2 24 09/07/2019 0029   ACIDBASEDEF 5.0 (H) 08/18/2019 2341   O2SAT 99.0 09/07/2019 0029   CBG (last 3)  Recent Labs    10/11/19 2004 10/11/19 2333 10/12/19 0336  GLUCAP 138* 146* 170*    Meds Scheduled Meds: .  stroke: mapping our early stages of recovery book   Does not apply Once  . sodium chloride   Intravenous Once  . amiodarone  200 mg Per Tube Daily  . arformoterol  15 mcg Nebulization BID  . aspirin  81 mg Per Tube  Daily  . atorvastatin  40 mg Per Tube Daily  . bisacodyl  10 mg Rectal Daily  . chlorhexidine  15 mL Mouth Rinse BID  . Chlorhexidine Gluconate Cloth  6 each Topical Q0600  . Chlorhexidine Gluconate Cloth  6 each Topical Q0600  . Chlorhexidine Gluconate Cloth  6 each Topical Q0600  . clonazePAM  0.5 mg Per Tube BID  . darbepoetin (ARANESP) injection - DIALYSIS  100 mcg Intravenous Q Tue-HD  . feeding supplement (NEPRO CARB STEADY)  720 mL Per Tube Q24H  . feeding supplement (PRO-STAT SUGAR FREE 64)  60 mL Per Tube BID  . insulin aspart  0-15 Units Subcutaneous Q4H  . insulin detemir  12 Units Subcutaneous BID  . mouth rinse  15 mL Mouth Rinse q12n4p  . multivitamin  1 tablet Per Tube QHS  . nystatin  5 mL Oral QID  . pantoprazole  40 mg Oral Daily  . revefenacin  175 mcg Nebulization Daily  . sertraline  25 mg Per Tube Daily  . sevelamer carbonate  1.6 g Per Tube TID WC  . trospium  20 mg Per Tube BID   Continuous Infusions: . sodium chloride Stopped (09/14/19 0802)  . lactated ringers 20 mL/hr at 08/20/19 1200   PRN Meds:.acetaminophen (TYLENOL) oral  liquid 160 mg/5 mL, albuterol, dextrose, Gerhardt's butt cream, guaiFENesin, hydrALAZINE, HYDROcodone-acetaminophen, hydrocortisone cream, HYDROmorphone (DILAUDID) injection, HYDROmorphone (DILAUDID) injection, ondansetron (ZOFRAN) IV, ondansetron (ZOFRAN) IV, oxyCODONE, phenol, Resource ThickenUp Clear, sennosides, sodium chloride flush  Xrays No results found.  Assessment/Plan: S/P Procedure(s) (LRB): EVACUATION OF HEMATOMA (N/A)  1 statue quo on current management, cont same   LOS: 64 days    Christian Giovanni PA-C Pager [phone] 10/12/2019

## 2019-10-12 NOTE — Progress Notes (Signed)
PT Cancellation Note  Patient Details Name: Christian [name] MRN: [id number] DOB: 01/07/1946   Cancelled Treatment:    Reason Eval/Treat Not Completed: Patient at procedure or test/unavailable - at HD, PT to check back as schedule allows.  [place] Pager [phone]  Office [phone]    [name] D Elonda Husky 10/12/2019, 9:13 AM

## 2019-10-13 ENCOUNTER — Inpatient Hospital Stay (HOSPITAL_COMMUNITY): Payer: Medicare Other

## 2019-10-13 LAB — RENAL FUNCTION PANEL
Albumin: 2.5 g/dL — ABNORMAL LOW (ref 3.5–5.0)
Anion gap: 12 (ref 5–15)
BUN: 50 mg/dL — ABNORMAL HIGH (ref 8–23)
CO2: 26 mmol/L (ref 22–32)
Calcium: 9.4 mg/dL (ref 8.9–10.3)
Chloride: 96 mmol/L — ABNORMAL LOW (ref 98–111)
Creatinine, Ser: 3.77 mg/dL — ABNORMAL HIGH (ref 0.61–1.24)
GFR calc Af Amer: 17 mL/min — ABNORMAL LOW (ref >60)
GFR calc non Af Amer: 15 mL/min — ABNORMAL LOW (ref >60)
Glucose, Bld: 141 mg/dL — ABNORMAL HIGH (ref 70–99)
Phosphorus: 3.8 mg/dL (ref 2.5–4.6)
Potassium: 3.7 mmol/L (ref 3.5–5.1)
Sodium: 134 mmol/L — ABNORMAL LOW (ref 135–145)

## 2019-10-13 LAB — GLUCOSE, CAPILLARY
Glucose-Capillary: 146 mg/dL — ABNORMAL HIGH (ref 70–99)
Glucose-Capillary: 167 mg/dL — ABNORMAL HIGH (ref 70–99)
Glucose-Capillary: 197 mg/dL — ABNORMAL HIGH (ref 70–99)
Glucose-Capillary: 123 mg/dL — ABNORMAL HIGH (ref 70–99)
Glucose-Capillary: 145 mg/dL — ABNORMAL HIGH (ref 70–99)

## 2019-10-13 LAB — SARS CORONAVIRUS 2 (TAT 6-24 HRS): SARS Coronavirus 2: NEGATIVE

## 2019-10-13 LAB — MAGNESIUM: Magnesium: 2.3 mg/dL (ref 1.7–2.4)

## 2019-10-13 MED ORDER — CHLORHEXIDINE GLUCONATE CLOTH 2 % EX PADS: 6.0000 | MEDICATED_PAD | Freq: Every day | CUTANEOUS | Status: DC

## 2019-10-13 NOTE — Progress Notes (Signed)
Modified Barium Swallow Progress Note  Patient Details  Name: Christian [name] MRN: [id number] Date of Birth: 1945-12-16  Today's Date: 10/13/2019  Modified Barium Swallow completed.  Full report located under Chart Review in the Imaging Section.  Brief recommendations include the following:  Clinical Impression  Pt presents with mild oropharyngeal dysphagia characterized by impaired bolus cohesion with premature spillage to the valleculae and a pharyngeal delay. Pt's swallow was triggered with the head of the bolus at the level of the valleculae with thin liquids via cup and at the pyriform sinuses with thin liquids via straw. He demonstrated penetration (PAS 3) with consecutive swallows of thin liquids via straw secondary to the pharyngeal delay but no aspiration was noted. A regular texture diet with thin liquids is recommended at this time with observance of swallowing precautions. SLP will follow pt briefly to ensure diet tolerance.    Swallow Evaluation Recommendations       SLP Diet Recommendations: Regular solids;Thin liquid   Liquid Administration via: Cup;Straw   Medication Administration: Whole meds with liquid   Supervision: Patient able to self feed   Compensations: Slow rate;Small sips/bites (Individual sips only; no consecutive swallows. )       Oral Care Recommendations: Oral care BID       Markees Carns I. Hardin Negus, [place], [place] Office number [phone] Pager [phone]  Horton Marshall 10/13/2019,11:38 AM

## 2019-10-13 NOTE — Plan of Care (Signed)
  Problem: Education: Goal: Knowledge of General Education information will improve Description: Including pain rating scale, medication(s)/side effects and non-pharmacologic comfort measures Outcome: Progressing   Problem: Health Behavior/Discharge Planning: Goal: Ability to manage health-related needs will improve Outcome: Progressing   Problem: Clinical Measurements: Goal: Ability to maintain clinical measurements within normal limits will improve Outcome: Progressing   Problem: Clinical Measurements: Goal: Diagnostic test results will improve Outcome: Progressing   Problem: Nutrition: Goal: Adequate nutrition will be maintained Outcome: Progressing   Problem: Elimination: Goal: Will not experience complications related to bowel motility Outcome: Progressing   Problem: Pain Managment: Goal: General experience of comfort will improve Outcome: Progressing   Problem: Skin Integrity: Goal: Risk for impaired skin integrity will decrease Outcome: Progressing   Problem: Education: Goal: Will demonstrate proper wound care and an understanding of methods to prevent future damage Outcome: Progressing   Problem: Education: Goal: Knowledge of disease or condition will improve Outcome: Progressing   Problem: Education: Goal: Knowledge of the prescribed therapeutic regimen will improve Outcome: Progressing   Problem: Cardiac: Goal: Will achieve and/or maintain hemodynamic stability Outcome: Progressing   Problem: Skin Integrity: Goal: Wound healing without signs and symptoms of infection Outcome: Progressing

## 2019-10-13 NOTE — Consult Note (Signed)
[facility] Nurse Consult Note: Patient receiving care in [facility]. Reason for Consult: "Harvest site on left forearm appears deeper than when tranferred to floor and also is oozing pus". Wound type: Pressure Injury POA: Yes/No/NA Measurement: Wound bed: Drainage (amount, consistency, odor)  Periwound: Dressing procedure/placement/frequency: Considering the consult order and the description I received over the phone with the primary RN,  I asked the primary RN to reach out to the surgical team and get them involved.  She did.  Jadene Pierini, [place] viewed the wound and will have Dr. Orvan Seen look at it. Monitor the wound area(s) for worsening of condition such as: Signs/symptoms of infection,  Increase in size,  Development of or worsening of odor, Development of pain, or increased pain at the affected locations.  Notify the medical team if any of these develop.  Thank you for the consult.  Discussed plan of care with the bedside nurse Christy.  [place] [facility] nurse will not follow at this time.  Please re-consult the [facility] team if needed.  Val Riles, RN, MSN, CWOCN, CNS-BC, pager [phone]

## 2019-10-13 NOTE — Progress Notes (Signed)
  Speech Language Pathology Treatment: Dysphagia  Patient Details Name: Christian [name] MRN: [id number] DOB: 05-22-1945 Today's Date: 10/13/2019 Time: 8832-5498 SLP Time Calculation (min) (ACUTE ONLY): 16 min  Assessment / Plan / Recommendation Clinical Impression  Pt was seen for dysphagia treatment. He was alert and cooperative throughout the session. He tolerated thin liquids via straw and independently used individual sips. Pt's wife was educated regarding the results of the modified barium swallow study, diet recommendations, and swallowing precautions; re-education was provided to the pt. Video recording of the study was used to facilitate understanding and both parties verbalized understanding. SLP will continue to follow pt.    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 4/23 cardiac arrest, 4/29 initiated CRRT, vent s/p trach. 5/10 additional sternal washout unable to close.  5/12 sternal plating with pectoralis flap and omental harvest to mediastinum off CRRT with iHD started. Chest wound closed. Neurology consulted for left sided weakness- CTH neg 09/08/19. 5/20 pt with emergent hematoma evacuation and transition to trach collar; 5/28 downsized to #4 cuffless; ultimately decannulated. PMHx: BPH, CAD, DM, HLD, HTN Had MBS on 09/30/19, weakness, poor visualization of airway, could not recommend thin liquids. FEES repeated to determine best diet textures. PEG placed on 10/04/19. Trach decannulated on 10/05/19.      SLP Plan  Continue with current plan of care       Recommendations  Diet recommendations: Regular;Thin liquid Liquids provided via: Cup;Straw Medication Administration: Whole meds with puree Supervision: Full supervision/cueing for compensatory strategies Compensations: Slow rate;Small sips/bites;Follow solids with liquid Postural Changes and/or Swallow Maneuvers: Upright 30-60 min after meal;Seated  upright 90 degrees                Oral Care Recommendations: Oral care BID Follow up Recommendations: 24 hour supervision/assistance SLP Visit Diagnosis: Dysphagia, oropharyngeal phase (R13.12) Plan: Continue with current plan of care       Rimas Gilham I. Hardin Negus, [place], [place] Office number [phone] Pager [place] 10/13/2019, 2:25 PM

## 2019-10-13 NOTE — Progress Notes (Signed)
[address]       [place] [address]             [phone]      29 Days Post-Op Procedure(s) (LRB): EVACUATION OF HEMATOMA (N/A) Subjective: Radial site has area of incision that has dehisced further and is much deeper than had been  Objective: Vital signs in last 24 hours: Temp:  [98 F (36.7 C)-98.5 F (36.9 C)] 98.1 F (36.7 C) (06/18 0001) Pulse Rate:  [105-109] 106 (06/18 0001) Cardiac Rhythm: Sinus tachycardia (06/18 0707) Resp:  [15-24] 17 (06/18 0001) BP: (97-127)/(64-79) 123/68 (06/18 0001) SpO2:  [96 %-100 %] 97 % (06/18 0001) Weight:  [83.6 kg] 83.6 kg (06/18 0425)  Hemodynamic parameters for last 24 hours:    Intake/Output from previous day: 06/17 0701 - 06/18 0700 In: 917 [P.O.:240; NG/GT:617] Out: 1700  Intake/Output this shift: No intake/output data recorded.  General appearance: alert, cooperative and no distress Heart: regular rate and rhythm Lungs: dim in lleft>right base Abdomen: minor tenderness over G tube site Extremities: minor ankle edema Wound: incis left radial site has further dehisced, does not appear infected but there is some necrotic skin edge and fat  Lab Results: Recent Labs    10/12/19 0739  WBC 14.6*  HGB 9.3*  HCT 30.3*  PLT 369   BMET:  Recent Labs    10/12/19 0248 10/13/19 0227  NA 131* 134*  K 3.9 3.7  CL 93* 96*  CO2 24 26  GLUCOSE 161* 141*  BUN 95* 50*  CREATININE 5.66* 3.77*  CALCIUM 9.4 9.4    PT/INR: No results for input(s): LABPROT, INR in the last 72 hours. ABG    Component Value Date/Time   PHART 7.481 (H) 09/07/2019 0029   HCO3 23.3 09/07/2019 0029   TCO2 24 09/07/2019 0029   ACIDBASEDEF 5.0 (H) 08/18/2019 2341   O2SAT 99.0 09/07/2019 0029   CBG (last 3)  Recent Labs    10/12/19 1954 10/12/19 2347 10/13/19 0408  GLUCAP 122* 189* 146*    Meds Scheduled Meds: .  stroke: mapping our early stages of recovery book   Does not apply Once  . sodium chloride    Intravenous Once  . amiodarone  200 mg Per Tube Daily  . arformoterol  15 mcg Nebulization BID  . aspirin  81 mg Per Tube Daily  . atorvastatin  40 mg Per Tube Daily  . bisacodyl  10 mg Rectal Daily  . chlorhexidine  15 mL Mouth Rinse BID  . Chlorhexidine Gluconate Cloth  6 each Topical Q0600  . Chlorhexidine Gluconate Cloth  6 each Topical Q0600  . Chlorhexidine Gluconate Cloth  6 each Topical Q0600  . Chlorhexidine Gluconate Cloth  6 each Topical Q0600  . clonazePAM  0.5 mg Per Tube BID  . darbepoetin (ARANESP) injection - DIALYSIS  100 mcg Intravenous Q Tue-HD  . feeding supplement (NEPRO CARB STEADY)  720 mL Per Tube Q24H  . feeding supplement (PRO-STAT SUGAR FREE 64)  60 mL Per Tube BID  . insulin aspart  0-15 Units Subcutaneous Q4H  . insulin detemir  12 Units Subcutaneous BID  . mouth rinse  15 mL Mouth Rinse q12n4p  . multivitamin  1 tablet Per Tube QHS  . nystatin  5 mL Oral QID  . pantoprazole  40 mg Oral Daily  . revefenacin  175 mcg Nebulization Daily  . sertraline  25 mg Per Tube Daily  . sevelamer carbonate  1.6  g Per Tube TID WC  . trospium  20 mg Per Tube BID   Continuous Infusions: . sodium chloride Stopped (09/14/19 0802)  . lactated ringers 20 mL/hr at 08/20/19 1200   PRN Meds:.acetaminophen (TYLENOL) oral liquid 160 mg/5 mL, albuterol, dextrose, Gerhardt's butt cream, guaiFENesin, hydrALAZINE, HYDROcodone-acetaminophen, hydrocortisone cream, HYDROmorphone (DILAUDID) injection, HYDROmorphone (DILAUDID) injection, ondansetron (ZOFRAN) IV, ondansetron (ZOFRAN) IV, oxyCODONE, phenol, Resource ThickenUp Clear, sennosides, sodium chloride flush  Xrays No results found.  Assessment/Plan: S/P Procedure(s) (LRB): EVACUATION OF HEMATOMA (N/A)  1 will change dressings left radial site wet-dry saline BID, may need some debridement 2 he is o/w stable from hemodynamic perspective in sinus rhythm/tachy 3 conts renal management as per nephrology 4 cont other RX/TX -  current  LOS: 65 days    John Giovanni PA-C Pager [phone] 10/13/2019

## 2019-10-13 NOTE — Progress Notes (Signed)
[facility] ROUNDING NOTE   Subjective:   This is a 74 year old gentleman status post CABG status post cardiac arrest due to tamponade developed acute kidney injury receiving dialysis.  Previous creatinine was 1.17 July 2019.  Started on CRRT 08/24/2019 to 09/06/2019 to manage volume.  Now tolerating intermittent hemodialysis through a right IJ TDC placed by interventional radiology 09/19/2019.  Physical therapy assessment appreciate assistance from [facility] recommendations for SNF placement.  Patient beyond level of care that would be provided by LTAC.  Last dialysis 10/12/2019 with ultrafiltration 1.78 L  120/72 pulse 102 temperature 98.1 O2 sats 99% room air   Sodium 134 potassium 3.7 chloride 96 CO2 26 BUN 50 creatinine 3.77 glucose 141 calcium 9.4 phosphorus 3.8 magnesium 2.3 hemoglobin 9.3  Last chest x-ray 10/08/2019 small left   pleural effusion  Aspirin 81 mg daily atorvastatin 40 mg daily, darbepoetin 100 mcg every Tuesday last administered 10/03/2019, insulin sliding scale, Levemir 12 units twice daily, midodrine 5 mg 3 times daily, Protonix 40 mg daily, Renvela 1.6 g 3 times daily, Sanctura 20 mg twice daily  Objective:  Vital signs in last 24 hours:  Temp:  [98 F (36.7 C)-98.5 F (36.9 C)] 98.1 F (36.7 C) (06/18 0001) Pulse Rate:  [103-109] 106 (06/18 0001) Resp:  [15-24] 17 (06/18 0001) BP: (97-139)/(64-79) 123/68 (06/18 0001) SpO2:  [96 %-100 %] 97 % (06/18 0001) Weight:  [83.6 kg] 83.6 kg (06/18 0425)  Weight change:  Filed Weights   10/10/19 0500 10/10/19 0745 10/13/19 0425  Weight: 84.4 kg 84.4 kg 83.6 kg    Intake/Output: I/O last 3 completed shifts: In: 2215 [P.O.:600; Other:60; NG/GT:1555] Out: 1780 [Urine:80; Other:1700]   Intake/Output this shift:  Total I/O In: 677 [Other:60; NG/GT:617] Out: 0   General:NAD, not in distress, lying on bed comfortable Heart:RRR, s1s2 nl no rubs Lungs: Clear bilateral, no wheezing Abdomen:soft,  Non-tender, non-distended, gastric feeding tube Extremities:No edema Dialysis Access: Right IJ TDC, clean site with no evidence of bleeding.   Basic Metabolic Panel: Recent Labs  Lab 10/09/19 1054 10/09/19 1054 10/10/19 0500 10/10/19 0500 10/11/19 0310 10/12/19 0248 10/13/19 0227  NA 127*  --  126*  --  134* 131* 134*  K 4.0  --  4.4  --  3.8 3.9 3.7  CL 88*  --  87*  --  95* 93* 96*  CO2 22  --  23  --  22 24 26   GLUCOSE 180*  --  143*  --  200* 161* 141*  BUN 103*  --  128*  --  59* 95* 50*  CREATININE 5.88*  --  6.91*  --  4.25* 5.66* 3.77*  CALCIUM 9.5   < > 9.3   < > 9.2 9.4 9.4  MG 2.5*  --  2.8*  --  2.3 2.5* 2.3  PHOS 5.3*  --  6.7*  --  5.3* 5.4* 3.8   < > = values in this interval not displayed.    Liver Function Tests: Recent Labs  Lab 10/09/19 1054 10/10/19 0500 10/11/19 0310 10/12/19 0248 10/13/19 0227  ALBUMIN 2.7* 2.5* 2.4* 2.5* 2.5*   No results for input(s): LIPASE, AMYLASE in the last 168 hours. No results for input(s): AMMONIA in the last 168 hours.  CBC: Recent Labs  Lab 10/07/19 1335 10/10/19 0500 10/12/19 0739  WBC 18.1* 16.2* 14.6*  HGB 9.7* 9.4* 9.3*  HCT 31.2* 29.4* 30.3*  MCV 100.3* 98.7 101.0*  PLT 472* 406* 369    Cardiac  Enzymes: No results for input(s): CKTOTAL, CKMB, CKMBINDEX, TROPONINI in the last 168 hours.  BNP: Invalid input(s): POCBNP  CBG: Recent Labs  Lab 10/12/19 0336 10/12/19 1640 10/12/19 1954 10/12/19 2347 10/13/19 0408  GLUCAP 170* 122* 122* 189* 146*    Microbiology: Results for orders placed or performed during the hospital encounter of 08/09/19  Respiratory Panel by RT PCR (Flu A&B, Covid) - Nasopharyngeal Swab     Status: None   Collection Time: 08/09/19  3:31 PM   Specimen: Nasopharyngeal Swab  Result Value Ref Range Status   SARS Coronavirus 2 by RT PCR NEGATIVE NEGATIVE Final    Comment: (NOTE) SARS-CoV-2 target nucleic acids are NOT DETECTED. The SARS-CoV-2 RNA is generally detectable  in upper respiratoy specimens during the acute phase of infection. The lowest concentration of SARS-CoV-2 viral copies this assay can detect is 131 copies/mL. A negative result does not preclude SARS-Cov-2 infection and should not be used as the sole basis for treatment or other patient management decisions. A negative result may occur with  improper specimen collection/handling, submission of specimen other than nasopharyngeal swab, presence of viral mutation(s) within the areas targeted by this assay, and inadequate number of viral copies (<131 copies/mL). A negative result must be combined with clinical observations, patient history, and epidemiological information. The expected result is Negative. Fact Sheet for Patients:  PinkCheek.be Fact Sheet for Healthcare Providers:  GravelBags.it This test is not yet ap proved or cleared by the [place] FDA and  has been authorized for detection and/or diagnosis of SARS-CoV-2 by FDA under an Emergency Use Authorization (EUA). This EUA will remain  in effect (meaning this test can be used) for the duration of the COVID-19 declaration under Section 564(b)(1) of the Act, 21 U.S.C. section 360bbb-3(b)(1), unless the authorization is terminated or revoked sooner.    Influenza A by PCR NEGATIVE NEGATIVE Final   Influenza B by PCR NEGATIVE NEGATIVE Final    Comment: (NOTE) The Xpert Xpress SARS-CoV-2/FLU/RSV assay is intended as an aid in  the diagnosis of influenza from Nasopharyngeal swab specimens and  should not be used as a sole basis for treatment. Nasal washings and  aspirates are unacceptable for Xpert Xpress SARS-CoV-2/FLU/RSV  testing. Fact Sheet for Patients: PinkCheek.be Fact Sheet for Healthcare Providers: GravelBags.it This test is not yet approved or cleared by the [place] FDA and  has been authorized for  detection and/or diagnosis of SARS-CoV-2 by  FDA under an Emergency Use Authorization (EUA). This EUA will remain  in effect (meaning this test can be used) for the duration of the  Covid-19 declaration under Section 564(b)(1) of the Act, 21  U.S.C. section 360bbb-3(b)(1), unless the authorization is  terminated or revoked. Performed at [facility], [place] [address] [place], [place] [address]   Surgical pcr screen     Status: None   Collection Time: 08/14/19 10:20 PM   Specimen: Nasal Mucosa; Nasal Swab  Result Value Ref Range Status   MRSA, PCR NEGATIVE NEGATIVE Final   Staphylococcus aureus NEGATIVE NEGATIVE Final    Comment: (NOTE) The Xpert SA Assay (FDA approved for NASAL specimens in patients 80 years of age and older), is one component of a comprehensive surveillance program. It is not intended to diagnose infection nor to guide or monitor treatment. Performed at [facility], [place] [address], [place], [place] [address]   Culture, blood (routine x 2)     Status: None   Collection Time: 08/17/19 11:32 AM  Specimen: BLOOD RIGHT HAND  Result Value Ref Range Status   Specimen Description BLOOD RIGHT HAND  Final   Special Requests   Final    BOTTLES DRAWN AEROBIC AND ANAEROBIC Blood Culture adequate volume   Culture   Final    NO GROWTH 5 DAYS Performed at [facility], [address], [place], [place] [address]    Report Status 08/22/2019 FINAL  Final  Culture, blood (routine x 2)     Status: None   Collection Time: 08/17/19 11:39 AM   Specimen: BLOOD RIGHT HAND  Result Value Ref Range Status   Specimen Description BLOOD RIGHT HAND  Final   Special Requests   Final    BOTTLES DRAWN AEROBIC AND ANAEROBIC Blood Culture results may not be optimal due to an inadequate volume of blood received in culture bottles   Culture   Final    NO GROWTH 5 DAYS Performed at [facility], [place] [address], [place], [place] [address]    Report Status 08/22/2019  FINAL  Final  Culture, respiratory (non-expectorated)     Status: None   Collection Time: 08/27/19  7:42 AM   Specimen: Tracheal Aspirate; Respiratory  Result Value Ref Range Status   Specimen Description TRACHEAL ASPIRATE  Final   Special Requests Immunocompromised  Final   Gram Stain   Final    RARE WBC PRESENT, PREDOMINANTLY PMN RARE GRAM POSITIVE RODS    Culture   Final    FEW Consistent with normal respiratory flora. Performed at [facility], [place] [address] [place], [place] [address]    Report Status 08/29/2019 FINAL  Final  Culture, blood (routine x 2)     Status: None   Collection Time: 08/27/19 10:30 AM   Specimen: BLOOD RIGHT HAND  Result Value Ref Range Status   Specimen Description BLOOD RIGHT HAND  Final   Special Requests   Final    BOTTLES DRAWN AEROBIC AND ANAEROBIC Blood Culture adequate volume   Culture   Final    NO GROWTH 5 DAYS Performed at [facility], [place] [address], [place], [place] [address]    Report Status 09/01/2019 FINAL  Final  Culture, blood (routine x 2)     Status: None   Collection Time: 08/27/19 12:20 PM   Specimen: BLOOD RIGHT HAND  Result Value Ref Range Status   Specimen Description BLOOD RIGHT HAND  Final   Special Requests   Final    BOTTLES DRAWN AEROBIC ONLY Blood Culture results may not be optimal due to an inadequate volume of blood received in culture bottles   Culture   Final    NO GROWTH 5 DAYS Performed at [facility], [place] [address], [place], [place] [address]    Report Status 09/01/2019 FINAL  Final  Culture, blood (routine x 2)     Status: None   Collection Time: 08/31/19  8:50 AM   Specimen: BLOOD RIGHT HAND  Result Value Ref Range Status   Specimen Description BLOOD RIGHT HAND  Final   Special Requests   Final    BOTTLES DRAWN AEROBIC ONLY Blood Culture results may not be optimal due to an inadequate volume of blood received in culture bottles   Culture   Final    NO GROWTH 5 DAYS Performed at  [facility], [place] [address], [place],  [address]    Report Status 09/05/2019 FINAL  Final  Culture, blood (routine x 2)  Status: None   Collection Time: 08/31/19  9:01 AM   Specimen: BLOOD LEFT HAND  Result Value Ref Range Status   Specimen Description BLOOD LEFT HAND  Final   Special Requests   Final    BOTTLES DRAWN AEROBIC ONLY Blood Culture results may not be optimal due to an inadequate volume of blood received in culture bottles   Culture   Final    NO GROWTH 5 DAYS Performed at [facility], [place] [address] [place], [place] [address]    Report Status 09/05/2019 FINAL  Final  Culture, respiratory (non-expectorated)     Status: None   Collection Time: 08/31/19  9:05 AM   Specimen: Tracheal Aspirate; Respiratory  Result Value Ref Range Status   Specimen Description TRACHEAL ASPIRATE  Final   Special Requests NONE  Final   Gram Stain   Final    NO WBC SEEN FEW GRAM NEGATIVE RODS RARE GRAM POSITIVE COCCI RARE GRAM POSITIVE RODS RARE GRAM VARIABLE ROD Performed at [facility], [place] [address], [place], [place] [address]    Culture RARE PSEUDOMONAS AERUGINOSA  Final   Report Status 09/02/2019 FINAL  Final   Organism ID, Bacteria PSEUDOMONAS AERUGINOSA  Final      Susceptibility   Pseudomonas aeruginosa - MIC*    CEFTAZIDIME <=1 SENSITIVE Sensitive     CIPROFLOXACIN <=0.25 SENSITIVE Sensitive     GENTAMICIN <=1 SENSITIVE Sensitive     IMIPENEM 2 SENSITIVE Sensitive     PIP/TAZO <=4 SENSITIVE Sensitive     CEFEPIME <=1 SENSITIVE Sensitive     * RARE PSEUDOMONAS AERUGINOSA  Aerobic Culture (superficial specimen)     Status: None   Collection Time: 09/04/19  8:22 AM   Specimen: Wound  Result Value Ref Range Status   Specimen Description WOUND  Final   Special Requests STERNAL WOUND SPEC A  Final   Gram Stain NO WBC SEEN NO ORGANISMS SEEN   Final   Culture   Final    NO GROWTH 2 DAYS Performed at [facility], [address]  [place], [place] [address]    Report Status 09/06/2019 FINAL  Final  Culture, blood (routine x 2)     Status: None   Collection Time: 09/08/19  9:23 AM   Specimen: BLOOD LEFT HAND  Result Value Ref Range Status   Specimen Description BLOOD LEFT HAND  Final   Special Requests   Final    BOTTLES DRAWN AEROBIC ONLY Blood Culture adequate volume   Culture   Final    NO GROWTH 5 DAYS Performed at [facility], [place] [address] [place], [place] [address]    Report Status 09/13/2019 FINAL  Final  Culture, blood (routine x 2)     Status: None   Collection Time: 09/08/19  9:27 AM   Specimen: BLOOD RIGHT HAND  Result Value Ref Range Status   Specimen Description BLOOD RIGHT HAND  Final   Special Requests   Final    BOTTLES DRAWN AEROBIC ONLY Blood Culture adequate volume   Culture   Final    NO GROWTH 5 DAYS Performed at [facility], [place] [address], [place], [place] [address]    Report Status 09/13/2019 FINAL  Final  Culture, respiratory (non-expectorated)     Status: None   Collection Time: 09/08/19  3:17 PM   Specimen: Tracheal Aspirate; Respiratory  Result Value Ref Range Status   Specimen Description TRACHEAL ASPIRATE  Final  Special Requests NONE  Final   Gram Stain NO RBC SEEN RARE GRAM POSITIVE COCCI IN PAIRS   Final   Culture   Final    Consistent with normal respiratory flora. Performed at [facility], [place] [address] [place], [place] [address]    Report Status 09/11/2019 FINAL  Final  Aerobic/Anaerobic Culture (surgical/deep wound)     Status: None   Collection Time: 09/14/19  9:17 AM   Specimen: Soft Tissue, Other  Result Value Ref Range Status   Specimen Description TISSUE  Final   Special Requests HEMATOMA FROM CHEST SPEC A  Final   Gram Stain   Final    FEW WBC PRESENT, PREDOMINANTLY PMN NO ORGANISMS SEEN    Culture   Final    No growth aerobically or anaerobically. Performed at [facility], [place] [address] [place], [place] [address]     Report Status 09/19/2019 FINAL  Final  Surgical PCR screen     Status: None   Collection Time: 09/23/19  2:48 PM   Specimen: Nasal Mucosa; Nasal Swab  Result Value Ref Range Status   MRSA, PCR NEGATIVE NEGATIVE Final   Staphylococcus aureus NEGATIVE NEGATIVE Final    Comment: (NOTE) The Xpert SA Assay (FDA approved for NASAL specimens in patients 22 years of age and older), is one component of a comprehensive surveillance program. It is not intended to diagnose infection nor to guide or monitor treatment. Performed at [facility], [place] [address] [place],  [address]     Coagulation Studies: No results for input(s): LABPROT, INR in the last 72 hours.  Urinalysis: No results for input(s): COLORURINE, LABSPEC, PHURINE, GLUCOSEU, HGBUR, BILIRUBINUR, KETONESUR, PROTEINUR, UROBILINOGEN, NITRITE, LEUKOCYTESUR in the last 72 hours.  Invalid input(s): APPERANCEUR    Imaging: No results found.   Medications:   . sodium chloride Stopped (09/14/19 0802)  . lactated ringers 20 mL/hr at 08/20/19 1200   .  stroke: mapping our early stages of recovery book   Does not apply Once  . sodium chloride   Intravenous Once  . amiodarone  200 mg Per Tube Daily  . arformoterol  15 mcg Nebulization BID  . aspirin  81 mg Per Tube Daily  . atorvastatin  40 mg Per Tube Daily  . bisacodyl  10 mg Rectal Daily  . chlorhexidine  15 mL Mouth Rinse BID  . Chlorhexidine Gluconate Cloth  6 each Topical Q0600  . Chlorhexidine Gluconate Cloth  6 each Topical Q0600  . Chlorhexidine Gluconate Cloth  6 each Topical Q0600  . clonazePAM  0.5 mg Per Tube BID  . darbepoetin (ARANESP) injection - DIALYSIS  100 mcg Intravenous Q Tue-HD  . feeding supplement (NEPRO CARB STEADY)  720 mL Per Tube Q24H  . feeding supplement (PRO-STAT SUGAR FREE 64)  60 mL Per Tube BID  . insulin aspart  0-15 Units Subcutaneous Q4H  . insulin detemir  12 Units Subcutaneous BID  . mouth rinse  15 mL Mouth Rinse q12n4p  .  multivitamin  1 tablet Per Tube QHS  . nystatin  5 mL Oral QID  . pantoprazole  40 mg Oral Daily  . revefenacin  175 mcg Nebulization Daily  . sertraline  25 mg Per Tube Daily  . sevelamer carbonate  1.6 g Per Tube TID WC  . trospium  20 mg Per Tube BID   acetaminophen (TYLENOL) oral liquid 160 mg/5 mL, albuterol, dextrose, Gerhardt's butt cream, guaiFENesin, hydrALAZINE, HYDROcodone-acetaminophen, hydrocortisone cream,  HYDROmorphone (DILAUDID) injection, HYDROmorphone (DILAUDID) injection, ondansetron (ZOFRAN) IV, ondansetron (ZOFRAN) IV, oxyCODONE, phenol, Resource ThickenUp Clear, sennosides, sodium chloride flush  Assessment/ Plan:   Acute kidney injury status post cardiac catheterization, CABG, cardiogenic shock and cardiac arrest.  Started CRRT 08/24/2019 until 09/06/2019 for volume control.  Now tolerating intermittent dialysis.  His last dialysis treatment was 10/12/2019 with ultrafiltration of 1.7 L    .  Continues to be anuric.  Watching for recovery.  Plan for dialysis 10/12/2019.  Appreciate assistance from renal navigator.  Appreciate assistance from Dr. Royce Macadamia medical director of [facility].  Patient appears to be tolerating reclining wheelchair for dialysis.  We shall finally make arrangements for outpatient placement.  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  Appears to be tolerating well with dialysis.  .  Chest x-ray does not show any significant pulmonary congestion 10/09/2019  Coronary artery disease status post CABG complicated by cardiac tamponade status post reopening chest urgently 08/18/2019 with wound VAC.  Most recently went back to the OR on 5/12 and 09/14/2019.  Continues on aspirin and atorvastatin  Atrial fibrillation per primary service  Last 2D echo showed systolic heart failure 1/74/9449 ejection  fraction of about 40 to 45%.   LOS: [place] @TODAY @6 :30 AM

## 2019-10-13 NOTE — Progress Notes (Signed)
Renal Navigator has not yet heard back from [place] Eligibility Coordinator. Navigator contacted [facility] [place] reservation line and was told that patient has been pre-certified through 1/94/17. There is no further action needed for transportation at this time until it is time to schedule rides. Navigator left message for CM/A. [place] to inform.   Alphonzo Cruise, [place] Renal Navigator [phone]

## 2019-10-13 NOTE — TOC Progression Note (Addendum)
Transition of Care [facility]) - Progression Note    Patient Details  Name: Christian [name] MRN: [id number] Date of Birth: 06/19/1945  Transition of [facility]) CM/SW Contact  Sharin Mons, RN Phone Number: 10/13/2019, 9:06 AM  Clinical Narrative:    Pt tolerating HD sessions sitting. Difficult to place 2/2 debility , CLIP in process. [employer] application completed for transportation services, Renal navigator to f/u. [place] still following for potential acceptance.  Per CSW noted on 10/02/2019 : Original Lexicographer [phone]. denied by [facility]. An appeal for SNF authorization will need to be completed closer to discharge. Contact for fast appeal is [phone], option 3.  TOC team will continue to monitor for needs.....  Expected Discharge Plan: [place] Barriers to Discharge: Continued Medical Work up  Expected Discharge Plan and Services Expected Discharge Plan: [place] arrangements for the past 2 months: Single Family Home                                       Social Determinants of Health (SDOH) Interventions    Readmission Risk Interventions No flowsheet data found.

## 2019-10-13 NOTE — TOC Progression Note (Addendum)
Transition of Care [facility]) - Progression Note    Patient Details  Name: Christian [name] MRN: [id number] Date of Birth: 1945-08-14  Transition of Care [facility]) CM/SW Contact  Sharin Mons, RN Phone Number: 10/13/2019, 2:30 PM  Clinical Narrative:    Insurance authorization pending for SNF. New reference # [id number].Fax # [phone]. Updated clinical requested from insurance. Start date 6/20.  Regarding transportation for outpatient HD , transportation not confirmed. Renal navigator still waiting to hear back from [place].  NCM faxed requested clinicals to [phone].  10/13/2019 @ 1600 NCM received call from renal navigator confirming transportation is in place for pt's outpatient HD sessions. NCM called [place] insurance to begin today, 6/18.   10/13/2019 @ 4:39  NCM made [facility] admissions liaison aware insurance authorization pending for  SNF placement. Liaison stated they will not receive pt over the weekend , new HD pt. NCM called pt's wife to make her aware , call unsuccessful ,unable to leave voicemessage.   TOC team will continue to monitor and follow for needs ....   Expected Discharge Plan: [place] Barriers to Discharge: Inadequate or no insurance, Other (comment)  Expected Discharge Plan and Services Expected Discharge Plan: [place] arrangements for the past 2 months: Single Family Home                                       Social Determinants of Health (SDOH) Interventions    Readmission Risk Interventions No flowsheet data found.

## 2019-10-13 NOTE — Consult Note (Signed)
[facility] Nurse Consult Note: Patient receiving care in [place].  LFA surgical wound has been bedside debrided by Dr. Sammuel Cooper this morning. Reason for Consult: treatment recommendations for LFA surgical wound Wound type: non-healing harvest site Pressure Injury POA: Yes/No/NA Measurement: 5 cm x 1.1 cm x 1.8 cm with tunneling of 7.3 cm at 12 o'clock; 3.8 cm at 6 o'clock; 1.5 cm at 9 o'clock; and, 0.5 cm at 3 o'clock Wound bed: 100% brown/grey Drainage (amount, consistency, odor) none Periwound: intact Dressing procedure/placement/frequency: Twice dailypPlace saline moistened gauze into the LFA wound. Be sure to insert the gauze up into the tunnel at 12 o'clock (currently 7.3 cm in length) and at 6 o'clock (currently 3.8 cm in length). Cover with dry gauze, secure with kerlex. Monitor the wound area(s) for worsening of condition such as: Signs/symptoms of infection,  Increase in size,  Development of or worsening of odor, Development of pain, or increased pain at the affected locations.  Notify the medical team if any of these develop.  Thank you for the consult.  Discussed plan of care with the patient and bedside nurse.  [place] nurse will not follow at this time.  Please re-consult the [place] team if needed.  Val Riles, RN, MSN, CWOCN, CNS-BC, pager [phone]

## 2019-10-13 NOTE — Progress Notes (Signed)
Per conversation with Dr. Webb/Nephrologist, Dr. Remus Blake Director at [facility] clinic has agreed to accept patient for OP HD, which is greatly appreciated so that patient is able to discharge to SNF for rehab. He has been given a TTS schedule with a chair time of 12:15pm. He needs to arrive at 11:15am on his first day at the clinic in order to complete intake paperwork. Navigator will request that his wife meet him there on this day to assist. Since he requires a [employer] fast streamer for transfers, the [employer] is requesting that he arrive at 11:45am for all appointments following initial OP HD appointment. Due to patient with high acuity of needs, he will need to start at the OP HD clinic on a Tuesday or Thursday. He is NOT a candidate to start in the clinic on a Saturday. Navigator sent message to C. Rorie/[facility] [place] Eligibility Coordinator to check on status of transportation application submitted by unit CSW and is awaiting to hear back. Navigator left message for CM/A. Landry Mellow to communicate above. Renal Navigator will continue to follow closely.  Alphonzo Cruise, [place] Renal Navigator [phone]

## 2019-10-13 NOTE — Progress Notes (Signed)
29 Days Post-Op  Subjective: No complaints in regards to chest. No cp/sob. Reports being itchy Wife at bedside  Objective: Vital signs in last 24 hours: Temp:  [97.8 F (36.6 C)-98.5 F (36.9 C)] 97.9 F (36.6 C) (06/18 1640) Pulse Rate:  [97-106] 97 (06/18 1640) Resp:  [15-19] 17 (06/18 1640) BP: (111-131)/(68-78) 111/74 (06/18 1640) SpO2:  [96 %-100 %] 100 % (06/18 1640) Weight:  [83.6 kg] 83.6 kg (06/18 0425) Last BM Date: 10/10/19 (on night shift per report )  Intake/Output from previous day: 06/17 0701 - 06/18 0700 In: 917 [P.O.:240; NG/GT:617] Out: 1700  Intake/Output this shift: No intake/output data recorded.  General appearance: alert, cooperative and no distress Head: Normocephalic, without obvious abnormality, atraumatic Chest wall: no tenderness, midline sternal incision in tact, no dehiscence noted. Scabbing noted near most inferior portion. No drainage. No erythema. No swelling or fluid collections noted. Extremities: extremities normal, atraumatic, no cyanosis or edema   Lab Results:  CBC Latest Ref Rng & Units 10/12/2019 10/10/2019 10/07/2019  WBC 4.0 - 10.5 K/uL 14.6(H) 16.2(H) 18.1(H)  Hemoglobin 13.0 - 17.0 g/dL 9.3(L) 9.4(L) 9.7(L)  Hematocrit 39 - 52 % 30.3(L) 29.4(L) 31.2(L)  Platelets 150 - 400 K/uL 369 406(H) 472(H)    BMET Recent Labs    10/12/19 0248 10/13/19 0227  NA 131* 134*  K 3.9 3.7  CL 93* 96*  CO2 24 26  GLUCOSE 161* 141*  BUN 95* 50*  CREATININE 5.66* 3.77*  CALCIUM 9.4 9.4   PT/INR No results for input(s): LABPROT, INR in the last 72 hours. ABG No results for input(s): PHART, HCO3 in the last 72 hours.  Invalid input(s): PCO2, PO2  Studies/Results: DG Swallowing Func-Speech Pathology  Result Date: 10/13/2019 Objective Swallowing Evaluation: Type of Study: MBS-Modified Barium Swallow Study  Patient Details Name: Christian [name] MRN: [id number] Date of Birth: Aug 27, 1945 Today's Date: 10/13/2019 Time: SLP Start Time (ACUTE  ONLY): 1037 -SLP Stop Time (ACUTE ONLY): 1100 SLP Time Calculation (min) (ACUTE ONLY): 23 min Past Medical History: Past Medical History: Diagnosis Date . BPH (benign prostatic hypertrophy)  . Coronary artery disease  . Diabetes mellitus without complication ([place])  . Dry eyes left . Hiatal hernia  . 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  LIMA TO LAD, RIMA TO PDA, RADIAL ARTERY TO CIRC AND SEQUENTIALLY TO OM1. 08/15/2019 . Hyperlipidemia  . Hypertension  . Incomplete bladder emptying  . Nocturia  . 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 SOB (shortness of breath) on exertion  Past Surgical History: Past Surgical History: Procedure Laterality Date . APPLICATION OF WOUND VAC N/A 11/09/9676  Procedure: APPLICATION OF WOUND VAC;  Surgeon: [name], MD;  Location: [place];  Service: Thoracic;  Laterality: N/A; . APPLICATION OF WOUND VAC  08/29/2019  Procedure: Wound Vac change;  Surgeon: [name], MD;  Location: [place] [facility];  Service: Open Heart Surgery;; . APPLICATION OF WOUND VAC N/A 9/38/1017  Procedure: Application Of Wound Vac;  Surgeon: Grace Lachlan, MD;  Location: [place];  Service: Open Heart Surgery;  Laterality: N/A; . APPLICATION OF WOUND VAC N/A 09/04/2583  Procedure: APPLICATION OF ACELL, APPLICATION OF WOUND VAC USING PREVENA INCISIONAL  DRESSING;  Surgeon: Wallace Going, DO;  Location: [place];  Service: Plastics;  Laterality: N/A; . CARDIAC CATHETERIZATION   . CORONARY ARTERY BYPASS GRAFT  N/A 08/15/2019  Procedure: CORONARY ARTERY BYPASS GRAFTING (CABG), x 4 using bilateral IMAs and left radial artery .  LIMA TO LAD, RIMA TO PDA, RADIAL ARTERY TO CIRC AND SEQUENTIALLY TO OM1.;  Surgeon: [name], MD;  Location: [place];  Service: Open Heart Surgery;  Laterality: N/A; . CORONARY STENT PLACEMENT  02/27/2014  distal rt/pd coronary       dr Einar Gip .  CYSTO/ BLADDER BIOPSY'S/ CAUTHERIZATION  01-14-2004  DR Gaynelle Arabian . EXPLORATION POST OPERATIVE OPEN HEART N/A 08/16/2019  Procedure: Chest Closure S?P CABG WITH APPLICATION OF PREVENA  INCISIONAL WOUND VAC;  Surgeon: [name], MD;  Location: [facility];  Service: Open Heart Surgery;  Laterality: N/A; . EXPLORATION POST OPERATIVE OPEN HEART N/A 08/21/2019  Procedure: CHEST WASHOUT S/P OPEN CHEST;  Surgeon: [name], MD;  Location: [place];  Service: Open Heart Surgery;  Laterality: N/A;  Open chest with Esmark dressing with Ioban sealant coverage. . EXPLORATION POST OPERATIVE OPEN HEART N/A 08/18/2019  Procedure: EXPLORATION POST OPERATIVE OPEN HEART (performed 04/23 on unit);  Surgeon: [name], MD;  Location: [place];  Service: Open Heart Surgery;  Laterality: N/A; . EXPLORATION POST OPERATIVE OPEN HEART N/A 08/24/2019  Procedure: CHEST WASHOUT POST OPERATIVE OPEN HEART;  Surgeon: [name], MD;  Location: [place];  Service: Open Heart Surgery;  Laterality: N/A; . EXPLORATION POST OPERATIVE OPEN HEART N/A 08/29/2019  Procedure: CHEST WOUND WASHOUT POST OPERATIVE OPEN HEART;  Surgeon: [name], MD;  Location: [place];  Service: Open Heart Surgery;  Laterality: N/A; . EXPLORATION POST OPERATIVE OPEN HEART N/A 09/04/2019  Procedure: MEDIASTINAL EXPLORATION WITH STERNAL WOUND IRRIGATION;  Surgeon: Grace Alan, MD;  Location: [place];  Service: Open Heart Surgery;  Laterality: N/A; . EXPLORATION POST OPERATIVE OPEN HEART N/A 09/14/2019  Procedure: EVACUATION OF HEMATOMA;  Surgeon: Grace Haytham, MD;  Location: [place];  Service: Open Heart Surgery;  Laterality: N/A; . IR FLUORO GUIDE CV LINE LEFT  09/19/2019 . IR GASTROSTOMY TUBE MOD SED  10/04/2019 . IR PATIENT EVAL TECH 0-60 MINS  09/29/2019 . IR US GUIDE VASC ACCESS LEFT  09/19/2019 . LAPAROSCOPIC LYSIS OF ADHESIONS N/A 09/06/2019  Procedure: LAPAROSCOPIC OMENTAL HARVEST;  Surgeon: Kinsinger, Arta Bruce, MD;  Location: [place];  Service:  General;  Laterality: N/A; . LEFT HEART CATH AND CORONARY ANGIOGRAPHY N/A 08/10/2019  Procedure: LEFT HEART CATH AND CORONARY ANGIOGRAPHY;  Surgeon: Nigel Mormon, MD;  Location: [place] [facility];  Service: Cardiovascular;  Laterality: N/A; . LEFT HEART CATHETERIZATION WITH CORONARY ANGIOGRAM N/A 02/27/2014  Procedure: LEFT HEART CATHETERIZATION WITH CORONARY ANGIOGRAM;  Surgeon: Laverda Page, MD;  Location: [facility] CATH LAB;  Service: Cardiovascular;  Laterality: N/A; . MEDIASTINAL EXPLORATION N/A 09/06/2019  Procedure: MEDIASTINAL EXPLORATION;  Surgeon: Grace Ikaika, MD;  Location: [place];  Service: Thoracic;  Laterality: N/A; . PECTORALIS FLAP  09/06/2019  Procedure: Pectoralis ADVANCEMENT Flap;  Surgeon: Wallace Going, DO;  Location: [place];  Service: Plastics;; . PERCUTANEOUS CORONARY STENT INTERVENTION (PCI-S)  02/27/2014  Procedure: PERCUTANEOUS CORONARY STENT INTERVENTION (PCI-S);  Surgeon: Laverda Page, MD;  Location: [facility] CATH LAB;  Service: Cardiovascular;;  rt PDA  3.0/28mm Promus stent . RADIAL ARTERY HARVEST Left 08/15/2019  Procedure: Radial Artery Harvest;  Surgeon: [name], MD;  Location: [place];  Service: Open Heart Surgery;  Laterality: Left; . RIB PLATING N/A 09/06/2019  Procedure: STERNAL PLATING;  Surgeon: Grace Wenceslaus, MD;  Location: [place];  Service: Thoracic;  Laterality: N/A; . TEE WITHOUT CARDIOVERSION N/A 08/15/2019  Procedure: TRANSESOPHAGEAL ECHOCARDIOGRAM (TEE);  Surgeon: [name], MD;  Location: [place];  Service: Open Heart Surgery;  Laterality: N/A; . TRACHEOSTOMY TUBE PLACEMENT  08/29/2019  Procedure: Tracheostomy;  Surgeon: [name], MD;  Location: [place] [facility];  Service: Open Heart Surgery;; . TRANSURETHRAL RESECTION OF PROSTATE  04/04/2012  Procedure: TRANSURETHRAL RESECTION OF THE PROSTATE WITH GYRUS INSTRUMENTS;  Surgeon: Ailene Rud, MD;  Location: [place];  Service: Urology;  Laterality: N/A; . TRANSURETHRAL  RESECTION OF PROSTATE N/A 09/27/2014  Procedure: TRANSURETHRAL RESECTION OF THE PROSTATE ;  Surgeon: Carolan Clines, MD;  Location: [facility];  Service: Urology;  Laterality: N/A; . UPPER GASTROINTESTINAL ENDOSCOPY   HPI: 74 yo admitted with NSTEMI 4/15, RLL PE, 4/20 CABG x 4 remained open with wound VAC with repeated washouts. 4/23 cardiac arrest, 4/29 initiated CRRT, vent s/p trach. 5/10 additional sternal washout unable to close.  5/12 sternal plating with pectoralis flap and omental harvest to mediastinum off CRRT with iHD started. Chest wound closed. Neurology consulted for left sided weakness- CTH neg 09/08/19. 5/20 pt with emergent hematoma evacuation and transition to trach collar; 5/28 downsized to #4 cuffless; ultimately decannulated. PMHx: BPH, CAD, DM, HLD, HTN Had MBS on 09/30/19, weakness, poor visualization of airway, could not recommend thin liquids. FEES repeated to determine best diet textures. PEG placed on 10/04/19. Trach decannulated on 10/05/19.  Subjective: Awake, alert Assessment / Plan / Recommendation CHL IP CLINICAL IMPRESSIONS 10/13/2019 Clinical Impression   Pt presents with mild oropharyngeal dysphagia characterized by impaired bolus cohesion with premature spillage to the valleculae and a pharyngeal delay. Pt's swallow was triggered with the head of the bolus at the level of the valleculae with thin liquids via cup and at the pyriform sinuses with thin liquids via straw. He demonstrated penetration (PAS 3) with consecutive swallows of thin liquids via straw secondary to the pharyngeal delay but no aspiration was noted. A regular texture diet with thin liquids is recommended at this time with observance of swallowing precautions. SLP will follow pt briefly to ensure diet tolerance.  SLP Visit Diagnosis Dysphagia, oropharyngeal phase (R13.12) Attention and concentration deficit following -- Frontal lobe and executive function deficit following -- Impact on safety and function Mild aspiration  risk   CHL IP TREATMENT RECOMMENDATION 10/13/2019 Treatment Recommendations Therapy as outlined in treatment plan below   Prognosis 10/13/2019 Prognosis for Safe Diet Advancement Good Barriers to Reach Goals -- Barriers/Prognosis Comment -- CHL IP DIET RECOMMENDATION 10/13/2019 SLP Diet Recommendations Regular solids;Thin liquid Liquid Administration via Cup;Straw Medication Administration Whole meds with liquid Compensations Slow rate;Small sips/bites Postural Changes --   CHL IP OTHER RECOMMENDATIONS 10/13/2019 Recommended Consults -- Oral Care Recommendations Oral care BID Other Recommendations --   CHL IP FOLLOW UP RECOMMENDATIONS 10/13/2019 Follow up Recommendations None   CHL IP FREQUENCY AND DURATION 10/13/2019 Speech Therapy Frequency (ACUTE ONLY) min 2x/week Treatment Duration 1 week      CHL IP ORAL PHASE 10/13/2019 Oral Phase Impaired Oral - Pudding Teaspoon -- Oral - Pudding Cup -- Oral - Honey Teaspoon -- Oral - Honey Cup -- Oral - Nectar Teaspoon NT Oral - Nectar Cup -- Oral - Nectar Straw NT Oral - Thin Teaspoon -- Oral - Thin Cup Premature spillage;Decreased bolus cohesion Oral - Thin Straw Premature spillage;Decreased bolus cohesion Oral - Puree WFL Oral - Mech Soft -- Oral - Regular -- Oral - Multi-Consistency -- Oral - Pill --  Oral Phase - Comment --  CHL IP PHARYNGEAL PHASE 10/13/2019 Pharyngeal Phase Impaired Pharyngeal- Pudding Teaspoon -- Pharyngeal -- Pharyngeal- Pudding Cup -- Pharyngeal -- Pharyngeal- Honey Teaspoon -- Pharyngeal -- Pharyngeal- Honey Cup -- Pharyngeal -- Pharyngeal- Nectar Teaspoon NT Pharyngeal -- Pharyngeal- Nectar Cup -- Pharyngeal -- Pharyngeal- Nectar Straw -- Pharyngeal -- Pharyngeal- Thin Teaspoon NT Pharyngeal -- Pharyngeal- Thin Cup Delayed swallow initiation-vallecula Pharyngeal -- Pharyngeal- Thin Straw Reduced epiglottic inversion;Penetration/Aspiration during swallow;Delayed swallow initiation-pyriform sinuses Pharyngeal Material enters airway, remains ABOVE vocal  cords and not ejected out Pharyngeal- Puree WFL Pharyngeal Material does not enter airway Pharyngeal- Mechanical Soft -- Pharyngeal -- Pharyngeal- Regular WFL Pharyngeal -- Pharyngeal- Multi-consistency -- Pharyngeal -- Pharyngeal- Pill -- Pharyngeal -- Pharyngeal Comment --  CHL IP CERVICAL ESOPHAGEAL PHASE 10/13/2019 Cervical Esophageal Phase WFL Pudding Teaspoon -- Pudding Cup -- Honey Teaspoon -- Honey Cup -- Nectar Teaspoon -- Nectar Cup -- Nectar Straw -- Thin Teaspoon -- Thin Cup -- Thin Straw -- Puree -- Mechanical Soft -- Regular -- Multi-consistency -- Pill -- Cervical Esophageal Comment -- Shanika I. Hardin Negus, [place], [place] Office number [phone] Pager [phone] Horton Marshall 10/13/2019, 11:42 AM               Anti-infectives: Anti-infectives (From admission, onward)   Start     Dose/Rate Route Frequency Ordered Stop   10/04/19 0800  ceFAZolin (ANCEF) IVPB 2g/100 mL premix        2 g 200 mL/hr over 30 Minutes Intravenous To Radiology 10/03/19 1045 10/04/19 1545   09/19/19 1417  ceFAZolin (ANCEF) 2-4 GM/100ML-% IVPB       Note to Pharmacy: Rudene Re   : cabinet override      09/19/19 1417 09/20/19 0229   09/19/19 1330  ceFAZolin (ANCEF) IVPB 1 g/50 mL premix  Status:  Discontinued        1 g 100 mL/hr over 30 Minutes Intravenous Once 09/19/19 1317 09/19/19 1329   09/19/19 1330  ceFAZolin (ANCEF) IVPB 2g/100 mL premix        2 g 200 mL/hr over 30 Minutes Intravenous  Once 09/19/19 1327 09/19/19 1448   09/14/19 0915  vancomycin (VANCOCIN) IVPB 1000 mg/200 mL premix        1,000 mg 200 mL/hr over 60 Minutes Intravenous To Surgery 09/14/19 0907 09/15/19 0915   09/07/19 2000  vancomycin (VANCOREADY) IVPB 750 mg/150 mL        750 mg 150 mL/hr over 60 Minutes Intravenous  Once 09/07/19 1510 09/07/19 2245   09/06/19 2200  meropenem (MERREM) 500 mg in sodium chloride 0.9 % 100 mL IVPB        500 mg 200 mL/hr over 30 Minutes Intravenous Every 24  hours 09/06/19 1542 09/09/19 0015   09/06/19 2000  fluconazole (DIFLUCAN) IVPB 400 mg        400 mg 100 mL/hr over 120 Minutes Intravenous Every 24 hours 09/06/19 1542 09/08/19 2240   09/06/19 1800  vancomycin (VANCOREADY) IVPB 750 mg/150 mL        750 mg 150 mL/hr over 60 Minutes Intravenous  Once 09/06/19 1542 09/07/19 0022   09/06/19 1540  vancomycin variable dose per unstable renal function (pharmacist dosing)         Does not apply See admin instructions 09/06/19 1542 09/08/19 2359   09/06/19 1100  ceFAZolin (ANCEF) IVPB 2g/100 mL premix  Status:  Discontinued        2 g 200 mL/hr over 30 Minutes Intravenous 30  min pre-op 09/05/19 1717 09/06/19 1644   09/06/19 0600  ceFAZolin (ANCEF) IVPB 2g/100 mL premix  Status:  Discontinued        2 g 200 mL/hr over 30 Minutes Intravenous To Short Stay 09/06/19 0546 09/06/19 1644   09/02/19 2200  meropenem (MERREM) 1 g in sodium chloride 0.9 % 100 mL IVPB  Status:  Discontinued        1 g 200 mL/hr over 30 Minutes Intravenous Every 8 hours 09/02/19 1011 09/06/19 1542   09/02/19 2000  fluconazole (DIFLUCAN) IVPB 800 mg  Status:  Discontinued        800 mg 100 mL/hr over 240 Minutes Intravenous Every 24 hours 09/02/19 1011 09/06/19 1542   09/02/19 2000  fluconazole (DIFLUCAN) IVPB 800 mg  Status:  Discontinued        800 mg 200 mL/hr over 120 Minutes Intravenous Every 24 hours 09/02/19 1329 09/02/19 1330   09/02/19 1800  vancomycin (VANCOCIN) IVPB 1000 mg/200 mL premix  Status:  Discontinued        1,000 mg 200 mL/hr over 60 Minutes Intravenous Every 24 hours 09/02/19 1011 09/06/19 1542   09/02/19 0900  meropenem (MERREM) 1 g in sodium chloride 0.9 % 100 mL IVPB  Status:  Discontinued        1 g 200 mL/hr over 30 Minutes Intravenous Every 24 hours 09/01/19 1333 09/02/19 1011   09/01/19 1700  fluconazole (DIFLUCAN) IVPB 800 mg        800 mg 100 mL/hr over 240 Minutes Intravenous  Once 09/01/19 1645 09/01/19 2057   09/01/19 1630  fluconazole  (DIFLUCAN) IVPB 400 mg  Status:  Discontinued        400 mg 100 mL/hr over 120 Minutes Intravenous  Once 09/01/19 1629 09/01/19 1645   09/01/19 1330  vancomycin variable dose per unstable renal function (pharmacist dosing)  Status:  Discontinued         Does not apply See admin instructions 09/01/19 1333 09/02/19 1325   09/01/19 0730  meropenem (MERREM) 1 g in sodium chloride 0.9 % 100 mL IVPB  Status:  Discontinued        1 g 200 mL/hr over 30 Minutes Intravenous Every 8 hours 09/01/19 0729 09/01/19 1327   08/31/19 2200  vancomycin (VANCOCIN) IVPB 1000 mg/200 mL premix  Status:  Discontinued        1,000 mg 200 mL/hr over 60 Minutes Intravenous Every 24 hours 08/30/19 2235 09/01/19 1330   08/25/19 0800  vancomycin (VANCOREADY) IVPB 1250 mg/250 mL  Status:  Discontinued        1,250 mg 166.7 mL/hr over 90 Minutes Intravenous Every 24 hours 08/24/19 2121 08/24/19 2128   08/25/19 0100  vancomycin (VANCOREADY) IVPB 1250 mg/250 mL  Status:  Discontinued        1,250 mg 166.7 mL/hr over 90 Minutes Intravenous Every 24 hours 08/24/19 2128 08/30/19 2235   08/24/19 2300  ceFEPIme (MAXIPIME) 2 g in sodium chloride 0.9 % 100 mL IVPB  Status:  Discontinued        2 g 200 mL/hr over 30 Minutes Intravenous Every 12 hours 08/24/19 2150 09/01/19 0729   08/24/19 1338  vancomycin (VANCOCIN) 1,000 mg in sodium chloride 0.9 % 1,000 mL irrigation  Status:  Discontinued          As needed 08/24/19 1338 08/24/19 1508   08/21/19 0000  ceFEPIme (MAXIPIME) 1 g in sodium chloride 0.9 % 100 mL IVPB  Status:  Discontinued  1 g 200 mL/hr over 30 Minutes Intravenous Every 24 hours 08/20/19 0954 08/24/19 2150   08/20/19 1200  vancomycin (VANCOCIN) IVPB 1000 mg/200 mL premix        1,000 mg 200 mL/hr over 60 Minutes Intravenous  Once 08/20/19 0954 08/21/19 0011   08/19/19 2129  vancomycin variable dose per unstable renal function (pharmacist dosing)  Status:  Discontinued         Does not apply See admin  instructions 08/19/19 2129 08/24/19 2128   08/19/19 0000  ceFEPIme (MAXIPIME) 2 g in sodium chloride 0.9 % 100 mL IVPB  Status:  Discontinued        2 g 200 mL/hr over 30 Minutes Intravenous Every 24 hours 08/18/19 2351 08/20/19 0954   08/19/19 0000  vancomycin (VANCOREADY) IVPB 2000 mg/400 mL        2,000 mg 200 mL/hr over 120 Minutes Intravenous  Once 08/18/19 2351 08/19/19 0354   08/16/19 1731  vancomycin (VANCOCIN) powder  Status:  Discontinued          As needed 08/16/19 1732 08/16/19 1808   08/15/19 2045  vancomycin (VANCOCIN) IVPB 1000 mg/200 mL premix        1,000 mg 200 mL/hr over 60 Minutes Intravenous  Once 08/15/19 1402 08/15/19 2209   08/15/19 1645  cefUROXime (ZINACEF) 1.5 g in sodium chloride 0.9 % 100 mL IVPB        1.5 g 200 mL/hr over 30 Minutes Intravenous Every 12 hours 08/15/19 1402 08/17/19 0700   08/15/19 0958  vancomycin (VANCOCIN) powder  Status:  Discontinued          As needed 08/15/19 1000 08/15/19 1402   08/15/19 0400  vancomycin (VANCOREADY) IVPB 1500 mg/300 mL        1,500 mg 150 mL/hr over 120 Minutes Intravenous To Surgery 08/14/19 1121 08/15/19 0830   08/15/19 0400  cefUROXime (ZINACEF) 1.5 g in sodium chloride 0.9 % 100 mL IVPB        1.5 g 200 mL/hr over 30 Minutes Intravenous To Surgery 08/14/19 1121 08/15/19 0812   08/15/19 0400  cefUROXime (ZINACEF) 750 mg in sodium chloride 0.9 % 100 mL IVPB        750 mg 200 mL/hr over 30 Minutes Intravenous To Surgery 08/14/19 1121 08/15/19 1300      Assessment/Plan: s/p Procedure(s): EVACUATION OF HEMATOMA  Doing well, no changes    LOS: 65 days    Charlies Constable, PA-C 10/13/2019

## 2019-10-14 LAB — RENAL FUNCTION PANEL
Albumin: 2.5 g/dL — ABNORMAL LOW (ref 3.5–5.0)
Anion gap: 13 (ref 5–15)
BUN: 85 mg/dL — ABNORMAL HIGH (ref 8–23)
CO2: 24 mmol/L (ref 22–32)
Calcium: 9.2 mg/dL (ref 8.9–10.3)
Chloride: 92 mmol/L — ABNORMAL LOW (ref 98–111)
Creatinine, Ser: 5.48 mg/dL — ABNORMAL HIGH (ref 0.61–1.24)
GFR calc Af Amer: 11 mL/min — ABNORMAL LOW (ref >60)
GFR calc non Af Amer: 10 mL/min — ABNORMAL LOW (ref >60)
Glucose, Bld: 153 mg/dL — ABNORMAL HIGH (ref 70–99)
Phosphorus: 4.7 mg/dL — ABNORMAL HIGH (ref 2.5–4.6)
Potassium: 4 mmol/L (ref 3.5–5.1)
Sodium: 129 mmol/L — ABNORMAL LOW (ref 135–145)

## 2019-10-14 LAB — CBC
HCT: 30.2 % — ABNORMAL LOW (ref 39.0–52.0)
Hemoglobin: 9.2 g/dL — ABNORMAL LOW (ref 13.0–17.0)
MCH: 30.9 pg (ref 26.0–34.0)
MCHC: 30.5 g/dL (ref 30.0–36.0)
MCV: 101.3 fL — ABNORMAL HIGH (ref 80.0–100.0)
Platelets: 377 10*3/uL (ref 150–400)
RBC: 2.98 MIL/uL — ABNORMAL LOW (ref 4.22–5.81)
RDW: 19 % — ABNORMAL HIGH (ref 11.5–15.5)
WBC: 14.2 10*3/uL — ABNORMAL HIGH (ref 4.0–10.5)
nRBC: 0 % (ref 0.0–0.2)

## 2019-10-14 LAB — MAGNESIUM: Magnesium: 2.4 mg/dL (ref 1.7–2.4)

## 2019-10-14 LAB — GLUCOSE, CAPILLARY
Glucose-Capillary: 113 mg/dL — ABNORMAL HIGH (ref 70–99)
Glucose-Capillary: 130 mg/dL — ABNORMAL HIGH (ref 70–99)
Glucose-Capillary: 139 mg/dL — ABNORMAL HIGH (ref 70–99)
Glucose-Capillary: 171 mg/dL — ABNORMAL HIGH (ref 70–99)
Glucose-Capillary: 198 mg/dL — ABNORMAL HIGH (ref 70–99)
Glucose-Capillary: 93 mg/dL (ref 70–99)

## 2019-10-14 MED ORDER — HEPARIN SODIUM (PORCINE) 1000 UNIT/ML DIALYSIS: 1000.0000 [IU] | INTRAMUSCULAR | Status: DC | PRN

## 2019-10-14 MED ORDER — SENNOSIDES 8.8 MG/5ML PO SYRP
10.0000 mL | ORAL_SOLUTION | Freq: Every day | ORAL | Status: DC | PRN
  Filled 2019-10-14: qty 10

## 2019-10-14 MED ORDER — LIDOCAINE-PRILOCAINE 2.5-2.5 % EX CREA: 1.0000 "application " | TOPICAL_CREAM | CUTANEOUS | Status: DC | PRN

## 2019-10-14 MED ORDER — ASPIRIN 81 MG PO CHEW
81.0000 mg | CHEWABLE_TABLET | Freq: Every day | ORAL | Status: DC
  Administered 2019-10-15 – 2019-10-18 (×4): 81 mg via ORAL
  Filled 2019-10-14 (×5): qty 1

## 2019-10-14 MED ORDER — AMIODARONE HCL 200 MG PO TABS
200.0000 mg | ORAL_TABLET | Freq: Every day | ORAL | Status: DC
  Administered 2019-10-15 – 2019-10-18 (×4): 200 mg via ORAL
  Filled 2019-10-14 (×5): qty 1

## 2019-10-14 MED ORDER — ALTEPLASE 2 MG IJ SOLR: 2.0000 mg | Freq: Once | INTRAMUSCULAR | Status: DC | PRN

## 2019-10-14 MED ORDER — RENA-VITE PO TABS
1.0000 | ORAL_TABLET | Freq: Every day | ORAL | Status: DC
  Administered 2019-10-14 – 2019-10-17 (×4): 1 via ORAL
  Filled 2019-10-14 (×4): qty 1

## 2019-10-14 MED ORDER — ATORVASTATIN CALCIUM 40 MG PO TABS
40.0000 mg | ORAL_TABLET | Freq: Every day | ORAL | Status: DC
  Administered 2019-10-15 – 2019-10-18 (×4): 40 mg via ORAL
  Filled 2019-10-14 (×5): qty 1

## 2019-10-14 MED ORDER — SODIUM CHLORIDE 0.9 % IV SOLN: 100.0000 mL | INTRAVENOUS | Status: DC | PRN

## 2019-10-14 MED ORDER — SEVELAMER CARBONATE 0.8 G PO PACK
1.6000 g | PACK | Freq: Three times a day (TID) | ORAL | Status: DC
  Administered 2019-10-16 – 2019-10-17 (×3): 1.6 g via ORAL
  Filled 2019-10-14 (×13): qty 2

## 2019-10-14 MED ORDER — CLONAZEPAM 0.25 MG PO TBDP
0.5000 mg | ORAL_TABLET | Freq: Two times a day (BID) | ORAL | Status: DC
  Administered 2019-10-14 – 2019-10-18 (×8): 0.5 mg via ORAL
  Filled 2019-10-14 (×8): qty 2

## 2019-10-14 MED ORDER — ACETAMINOPHEN 325 MG PO TABS
650.0000 mg | ORAL_TABLET | Freq: Four times a day (QID) | ORAL | Status: DC | PRN
  Administered 2019-10-15 (×2): 650 mg via ORAL
  Filled 2019-10-14 (×2): qty 2

## 2019-10-14 MED ORDER — HEPARIN SODIUM (PORCINE) 1000 UNIT/ML IJ SOLN
INTRAMUSCULAR | Status: AC
  Administered 2019-10-14: 4200 [IU] via INTRAVENOUS_CENTRAL
  Filled 2019-10-14: qty 5

## 2019-10-14 MED ORDER — OXYCODONE HCL 5 MG PO TABS
5.0000 mg | ORAL_TABLET | Freq: Four times a day (QID) | ORAL | Status: DC | PRN
  Administered 2019-10-17 – 2019-10-18 (×3): 5 mg via ORAL
  Filled 2019-10-14 (×3): qty 1

## 2019-10-14 MED ORDER — TROSPIUM CHLORIDE 20 MG PO TABS
20.0000 mg | ORAL_TABLET | Freq: Two times a day (BID) | ORAL | Status: DC
  Administered 2019-10-14 – 2019-10-18 (×8): 20 mg via ORAL
  Filled 2019-10-14 (×9): qty 1

## 2019-10-14 MED ORDER — PENTAFLUOROPROP-TETRAFLUOROETH EX AERO: 1.0000 "application " | INHALATION_SPRAY | CUTANEOUS | Status: DC | PRN

## 2019-10-14 MED ORDER — LIDOCAINE HCL (PF) 1 % IJ SOLN: 5.0000 mL | INTRAMUSCULAR | Status: DC | PRN

## 2019-10-14 MED ORDER — SERTRALINE HCL 25 MG PO TABS
25.0000 mg | ORAL_TABLET | Freq: Every day | ORAL | Status: DC
  Administered 2019-10-15 – 2019-10-18 (×4): 25 mg via ORAL
  Filled 2019-10-14 (×5): qty 1

## 2019-10-14 NOTE — Progress Notes (Addendum)
      [address]       [place] [address]             [phone]      30 Days Post-Op Procedure(s) (LRB): EVACUATION OF HEMATOMA (N/A) Subjective: Just came back from dialysis and he is tired.   Objective: Vital signs in last 24 hours: Temp:  [97.9 F (36.6 C)-98.6 F (37 C)] 98.6 F (37 C) (06/19 1113) Pulse Rate:  [96-108] 107 (06/19 1113) Cardiac Rhythm: Normal sinus rhythm (06/19 1120) Resp:  [13-21] 19 (06/19 1113) BP: (98-158)/(58-100) 112/71 (06/19 1113) SpO2:  [97 %-100 %] 99 % (06/19 1113) Weight:  [84.7 kg] 84.7 kg (06/19 0354)     Intake/Output from previous day: 06/18 0701 - 06/19 0700 In: 924 [P.O.:480; NG/GT:444] Out: 75 [Urine:75] Intake/Output this shift: Total I/O In: -  Out: 2000 [Other:2000]  General appearance: cooperative, fatigued and no distress Heart: sinus tachycardia, no murmur, click, rub or gallop Lungs: clear to auscultation bilaterally Abdomen: soft, non-tender; bowel sounds normal; no masses,  no organomegaly Extremities: extremities normal, atraumatic, no cyanosis or edema and boots in place Wound: clean and dry  Lab Results: Recent Labs    10/12/19 0739 10/14/19 0727  WBC 14.6* 14.2*  HGB 9.3* 9.2*  HCT 30.3* 30.2*  PLT 369 377   BMET:  Recent Labs    10/13/19 0227 10/14/19 0237  NA 134* 129*  K 3.7 4.0  CL 96* 92*  CO2 26 24  GLUCOSE 141* 153*  BUN 50* 85*  CREATININE 3.77* 5.48*  CALCIUM 9.4 9.2    PT/INR: No results for input(s): LABPROT, INR in the last 72 hours. ABG    Component Value Date/Time   PHART 7.481 (H) 09/07/2019 0029   HCO3 23.3 09/07/2019 0029   TCO2 24 09/07/2019 0029   ACIDBASEDEF 5.0 (H) 08/18/2019 2341   O2SAT 99.0 09/07/2019 0029   CBG (last 3)  Recent Labs    10/13/19 2327 10/14/19 0351 10/14/19 1202  GLUCAP 198* 139* 113*    Assessment/Plan: S/P Procedure(s) (LRB): EVACUATION OF HEMATOMA (N/A)  1. Hemodynamically stable in sinus tachycardia. Rate low  100s. BP stable 2. Pulm-tolerating room air with excellent oxygen saturation 3. Renal-ESRF on dialysis. Per nephrology 4. H and H 9.2/30.2, expected acute blood loss anemia 5. Endo-blood glucose well controlled 6. Feeding tube in place. SLP with recent recs-regular diet, thin liquids. Meds with puree.   Plan: Had everything ready for discharge yesterday but lost SNF bed. Will change meds to oral per SLP recs. Continue medical management.    LOS: 66 days    Elgie Collard 10/14/2019  Agree with above. Tolerated HD well.

## 2019-10-14 NOTE — Progress Notes (Signed)
[facility] ROUNDING NOTE   Subjective:   This is a 74 year old gentleman status post CABG status post cardiac arrest due to tamponade developed acute kidney injury receiving dialysis.  Previous creatinine was 1.17 July 2019.  Started on CRRT 08/24/2019 to 09/06/2019 to manage volume.  Now tolerating intermittent hemodialysis through a right IJ TDC placed by interventional radiology 09/19/2019.  Physical therapy assessment appreciate assistance from [facility] recommendations for SNF placement.  Patient beyond level of care that would be provided by LTAC.  Last dialysis 10/12/2019 with ultrafiltration 1.78 L.  Patient currently receiving dialysis 10/14/2019  Blood pressure 105/61 pulse 99 temperature 98.1 O2 sats 100% room air  Hemoglobin 9.2 sodium 129 potassium 4 chloride 92 CO2 24 BUN 85 creatinine 5.48 glucose 153 calcium 9.2 phosphorus 4.7 magnesium 2.5 albumin 2.5  Last chest x-ray 10/08/2019 small left   pleural effusion  Aspirin 81 mg daily atorvastatin 40 mg daily, darbepoetin 100 mcg every Tuesday last administered 10/03/2019, insulin sliding scale, Levemir 12 units twice daily, midodrine 5 mg 3 times daily, Protonix 40 mg daily, Renvela 1.6 g 3 times daily, Sanctura 20 mg twice daily  Objective:  Vital signs in last 24 hours:  Temp:  [97.8 F (36.6 C)-98.3 F (36.8 C)] (P) 98.1 F (36.7 C) (06/19 0710) Pulse Rate:  [96-102] (P) 99 (06/19 0730) Resp:  [14-20] (P) 16 (06/19 0730) BP: (111-158)/(74-100) (P) 105/61 (06/19 0730) SpO2:  [97 %-100 %] (P) 100 % (06/19 0730) Weight:  [84.7 kg] 84.7 kg (06/19 0354)  Weight change: 1.1 kg Filed Weights   10/10/19 0745 10/13/19 0425 10/14/19 0354  Weight: 84.4 kg 83.6 kg 84.7 kg    Intake/Output: I/O last 3 completed shifts: In: 6160 [P.O.:480; Other:60; NG/GT:1061] Out: 75 [Urine:75]   Intake/Output this shift:  No intake/output data recorded.  General:NAD, not in distress, lying on bed comfortable Heart:RRR,  s1s2 nl no rubs Lungs: Clear bilateral, no wheezing Abdomen:soft, Non-tender, non-distended, gastric feeding tube Extremities:No edema Dialysis Access: Right IJ TDC, clean site with no evidence of bleeding.   Basic Metabolic Panel: Recent Labs  Lab 10/10/19 0500 10/10/19 0500 10/11/19 0310 10/11/19 0310 10/12/19 0248 10/13/19 0227 10/14/19 0237  NA 126*  --  134*  --  131* 134* 129*  K 4.4  --  3.8  --  3.9 3.7 4.0  CL 87*  --  95*  --  93* 96* 92*  CO2 23  --  22  --  24 26 24   GLUCOSE 143*  --  200*  --  161* 141* 153*  BUN 128*  --  59*  --  95* 50* 85*  CREATININE 6.91*  --  4.25*  --  5.66* 3.77* 5.48*  CALCIUM 9.3   < > 9.2   < > 9.4 9.4 9.2  MG 2.8*  --  2.3  --  2.5* 2.3 2.4  PHOS 6.7*  --  5.3*  --  5.4* 3.8 4.7*   < > = values in this interval not displayed.    Liver Function Tests: Recent Labs  Lab 10/10/19 0500 10/11/19 0310 10/12/19 0248 10/13/19 0227 10/14/19 0237  ALBUMIN 2.5* 2.4* 2.5* 2.5* 2.5*   No results for input(s): LIPASE, AMYLASE in the last 168 hours. No results for input(s): AMMONIA in the last 168 hours.  CBC: Recent Labs  Lab 10/07/19 1335 10/10/19 0500 10/12/19 0739 10/14/19 0727  WBC 18.1* 16.2* 14.6* 14.2*  HGB 9.7* 9.4* 9.3* 9.2*  HCT 31.2* 29.4* 30.3* 30.2*  MCV 100.3* 98.7 101.0* 101.3*  PLT 472* 406* 369 377    Cardiac Enzymes: No results for input(s): CKTOTAL, CKMB, CKMBINDEX, TROPONINI in the last 168 hours.  BNP: Invalid input(s): POCBNP  CBG: Recent Labs  Lab 10/13/19 1150 10/13/19 1642 10/13/19 1942 10/13/19 2327 10/14/19 0351  GLUCAP 197* 123* 145* 198* 139*    Microbiology: Results for orders placed or performed during the hospital encounter of 08/09/19  Respiratory Panel by RT PCR (Flu A&B, Covid) - Nasopharyngeal Swab     Status: None   Collection Time: 08/09/19  3:31 PM   Specimen: Nasopharyngeal Swab  Result Value Ref Range Status   SARS Coronavirus 2 by RT PCR NEGATIVE NEGATIVE Final     Comment: (NOTE) SARS-CoV-2 target nucleic acids are NOT DETECTED. The SARS-CoV-2 RNA is generally detectable in upper respiratoy specimens during the acute phase of infection. The lowest concentration of SARS-CoV-2 viral copies this assay can detect is 131 copies/mL. A negative result does not preclude SARS-Cov-2 infection and should not be used as the sole basis for treatment or other patient management decisions. A negative result may occur with  improper specimen collection/handling, submission of specimen other than nasopharyngeal swab, presence of viral mutation(s) within the areas targeted by this assay, and inadequate number of viral copies (<131 copies/mL). A negative result must be combined with clinical observations, patient history, and epidemiological information. The expected result is Negative. Fact Sheet for Patients:  PinkCheek.be Fact Sheet for Healthcare Providers:  GravelBags.it This test is not yet ap proved or cleared by the [place] FDA and  has been authorized for detection and/or diagnosis of SARS-CoV-2 by FDA under an Emergency Use Authorization (EUA). This EUA will remain  in effect (meaning this test can be used) for the duration of the COVID-19 declaration under Section 564(b)(1) of the Act, 21 U.S.C. section 360bbb-3(b)(1), unless the authorization is terminated or revoked sooner.    Influenza A by PCR NEGATIVE NEGATIVE Final   Influenza B by PCR NEGATIVE NEGATIVE Final    Comment: (NOTE) The Xpert Xpress SARS-CoV-2/FLU/RSV assay is intended as an aid in  the diagnosis of influenza from Nasopharyngeal swab specimens and  should not be used as a sole basis for treatment. Nasal washings and  aspirates are unacceptable for Xpert Xpress SARS-CoV-2/FLU/RSV  testing. Fact Sheet for Patients: PinkCheek.be Fact Sheet for Healthcare  Providers: GravelBags.it This test is not yet approved or cleared by the [place] FDA and  has been authorized for detection and/or diagnosis of SARS-CoV-2 by  FDA under an Emergency Use Authorization (EUA). This EUA will remain  in effect (meaning this test can be used) for the duration of the  Covid-19 declaration under Section 564(b)(1) of the Act, 21  U.S.C. section 360bbb-3(b)(1), unless the authorization is  terminated or revoked. Performed at [facility], [place] [address], [place], [place] [address]   Surgical pcr screen     Status: None   Collection Time: 08/14/19 10:20 PM   Specimen: Nasal Mucosa; Nasal Swab  Result Value Ref Range Status   MRSA, PCR NEGATIVE NEGATIVE Final   Staphylococcus aureus NEGATIVE NEGATIVE Final    Comment: (NOTE) The Xpert SA Assay (FDA approved for NASAL specimens in patients 38 years of age and older), is one component of a comprehensive surveillance program. It is not intended to diagnose infection nor to guide or monitor treatment. Performed at [facility], [place] [address], [place], [place] [address]   Culture, blood (routine  x 2)     Status: None   Collection Time: 08/17/19 11:32 AM   Specimen: BLOOD RIGHT HAND  Result Value Ref Range Status   Specimen Description BLOOD RIGHT HAND  Final   Special Requests   Final    BOTTLES DRAWN AEROBIC AND ANAEROBIC Blood Culture adequate volume   Culture   Final    NO GROWTH 5 DAYS Performed at [facility], [address], [place], [place] [address]    Report Status 08/22/2019 FINAL  Final  Culture, blood (routine x 2)     Status: None   Collection Time: 08/17/19 11:39 AM   Specimen: BLOOD RIGHT HAND  Result Value Ref Range Status   Specimen Description BLOOD RIGHT HAND  Final   Special Requests   Final    BOTTLES DRAWN AEROBIC AND ANAEROBIC Blood Culture results may not be optimal due to an inadequate volume of blood received in culture bottles    Culture   Final    NO GROWTH 5 DAYS Performed at [facility], [place] [address] [place], [place] [address]    Report Status 08/22/2019 FINAL  Final  Culture, respiratory (non-expectorated)     Status: None   Collection Time: 08/27/19  7:42 AM   Specimen: Tracheal Aspirate; Respiratory  Result Value Ref Range Status   Specimen Description TRACHEAL ASPIRATE  Final   Special Requests Immunocompromised  Final   Gram Stain   Final    RARE WBC PRESENT, PREDOMINANTLY PMN RARE GRAM POSITIVE RODS    Culture   Final    FEW Consistent with normal respiratory flora. Performed at [facility], [place] [address] [place], [place] [address]    Report Status 08/29/2019 FINAL  Final  Culture, blood (routine x 2)     Status: None   Collection Time: 08/27/19 10:30 AM   Specimen: BLOOD RIGHT HAND  Result Value Ref Range Status   Specimen Description BLOOD RIGHT HAND  Final   Special Requests   Final    BOTTLES DRAWN AEROBIC AND ANAEROBIC Blood Culture adequate volume   Culture   Final    NO GROWTH 5 DAYS Performed at [facility], [place] [address] [place], [place] [address]    Report Status 09/01/2019 FINAL  Final  Culture, blood (routine x 2)     Status: None   Collection Time: 08/27/19 12:20 PM   Specimen: BLOOD RIGHT HAND  Result Value Ref Range Status   Specimen Description BLOOD RIGHT HAND  Final   Special Requests   Final    BOTTLES DRAWN AEROBIC ONLY Blood Culture results may not be optimal due to an inadequate volume of blood received in culture bottles   Culture   Final    NO GROWTH 5 DAYS Performed at [facility], [place] [address] [place], [place] [address]    Report Status 09/01/2019 FINAL  Final  Culture, blood (routine x 2)     Status: None   Collection Time: 08/31/19  8:50 AM   Specimen: BLOOD RIGHT HAND  Result Value Ref Range Status   Specimen Description BLOOD RIGHT HAND  Final   Special Requests   Final    BOTTLES DRAWN AEROBIC ONLY Blood Culture  results may not be optimal due to an inadequate volume of blood received in culture bottles   Culture   Final    NO GROWTH 5 DAYS Performed at [facility], [place],  [place] [address]    Report Status 09/05/2019 FINAL  Final  Culture, blood (routine x 2)     Status: None   Collection Time: 08/31/19  9:01 AM   Specimen: BLOOD LEFT HAND  Result Value Ref Range Status   Specimen Description BLOOD LEFT HAND  Final   Special Requests   Final    BOTTLES DRAWN AEROBIC ONLY Blood Culture results may not be optimal due to an inadequate volume of blood received in culture bottles   Culture   Final    NO GROWTH 5 DAYS Performed at [facility], [place] [address] [place], [place] [address]    Report Status 09/05/2019 FINAL  Final  Culture, respiratory (non-expectorated)     Status: None   Collection Time: 08/31/19  9:05 AM   Specimen: Tracheal Aspirate; Respiratory  Result Value Ref Range Status   Specimen Description TRACHEAL ASPIRATE  Final   Special Requests NONE  Final   Gram Stain   Final    NO WBC SEEN FEW GRAM NEGATIVE RODS RARE GRAM POSITIVE COCCI RARE GRAM POSITIVE RODS RARE GRAM VARIABLE ROD Performed at [facility], [place] [address] [place], [place] [address]    Culture RARE PSEUDOMONAS AERUGINOSA  Final   Report Status 09/02/2019 FINAL  Final   Organism ID, Bacteria PSEUDOMONAS AERUGINOSA  Final      Susceptibility   Pseudomonas aeruginosa - MIC*    CEFTAZIDIME <=1 SENSITIVE Sensitive     CIPROFLOXACIN <=0.25 SENSITIVE Sensitive     GENTAMICIN <=1 SENSITIVE Sensitive     IMIPENEM 2 SENSITIVE Sensitive     PIP/TAZO <=4 SENSITIVE Sensitive     CEFEPIME <=1 SENSITIVE Sensitive     * RARE PSEUDOMONAS AERUGINOSA  Aerobic Culture (superficial specimen)     Status: None   Collection Time: 09/04/19  8:22 AM   Specimen: Wound  Result Value Ref Range Status   Specimen Description WOUND  Final   Special Requests STERNAL WOUND SPEC A  Final    Gram Stain NO WBC SEEN NO ORGANISMS SEEN   Final   Culture   Final    NO GROWTH 2 DAYS Performed at [facility], [address] [place], [place] [address]    Report Status 09/06/2019 FINAL  Final  Culture, blood (routine x 2)     Status: None   Collection Time: 09/08/19  9:23 AM   Specimen: BLOOD LEFT HAND  Result Value Ref Range Status   Specimen Description BLOOD LEFT HAND  Final   Special Requests   Final    BOTTLES DRAWN AEROBIC ONLY Blood Culture adequate volume   Culture   Final    NO GROWTH 5 DAYS Performed at [facility], [place] [address], [place], [place] [address]    Report Status 09/13/2019 FINAL  Final  Culture, blood (routine x 2)     Status: None   Collection Time: 09/08/19  9:27 AM   Specimen: BLOOD RIGHT HAND  Result Value Ref Range Status   Specimen Description BLOOD RIGHT HAND  Final   Special Requests   Final    BOTTLES DRAWN AEROBIC ONLY Blood Culture adequate volume   Culture   Final    NO GROWTH 5 DAYS Performed at [facility], [place] [address] [place],  [address]    Report Status 09/13/2019 FINAL  Final  Culture, respiratory (non-expectorated)     Status: None   Collection Time: 09/08/19  3:17 PM  Specimen: Tracheal Aspirate; Respiratory  Result Value Ref Range Status   Specimen Description TRACHEAL ASPIRATE  Final   Special Requests NONE  Final   Gram Stain NO RBC SEEN RARE GRAM POSITIVE COCCI IN PAIRS   Final   Culture   Final    Consistent with normal respiratory flora. Performed at [facility], [place] [address], [place], [place] [address]    Report Status 09/11/2019 FINAL  Final  Aerobic/Anaerobic Culture (surgical/deep wound)     Status: None   Collection Time: 09/14/19  9:17 AM   Specimen: Soft Tissue, Other  Result Value Ref Range Status   Specimen Description TISSUE  Final   Special Requests HEMATOMA FROM CHEST SPEC A  Final   Gram Stain   Final    FEW WBC PRESENT, PREDOMINANTLY PMN NO ORGANISMS  SEEN    Culture   Final    No growth aerobically or anaerobically. Performed at [facility], [place] [address] [place], [place] [address]    Report Status 09/19/2019 FINAL  Final  Surgical PCR screen     Status: None   Collection Time: 09/23/19  2:48 PM   Specimen: Nasal Mucosa; Nasal Swab  Result Value Ref Range Status   MRSA, PCR NEGATIVE NEGATIVE Final   Staphylococcus aureus NEGATIVE NEGATIVE Final    Comment: (NOTE) The Xpert SA Assay (FDA approved for NASAL specimens in patients 20 years of age and older), is one component of a comprehensive surveillance program. It is not intended to diagnose infection nor to guide or monitor treatment. Performed at [facility], [place] [address] [place], [place] [address]   SARS CORONAVIRUS 2 (TAT 6-24 HRS) Nasopharyngeal Nasopharyngeal Swab     Status: None   Collection Time: 10/13/19 12:17 PM   Specimen: Nasopharyngeal Swab  Result Value Ref Range Status   SARS Coronavirus 2 NEGATIVE NEGATIVE Final    Comment: (NOTE) SARS-CoV-2 target nucleic acids are NOT DETECTED.  The SARS-CoV-2 RNA is generally detectable in upper and lower respiratory specimens during the acute phase of infection. Negative results do not preclude SARS-CoV-2 infection, do not rule out co-infections with other pathogens, and should not be used as the sole basis for treatment or other patient management decisions. Negative results must be combined with clinical observations, patient history, and epidemiological information. The expected result is Negative.  Fact Sheet for Patients: SugarRoll.be  Fact Sheet for Healthcare Providers: https://www.woods-mathews.com/  This test is not yet approved or cleared by the [place] FDA and  has been authorized for detection and/or diagnosis of SARS-CoV-2 by FDA under an Emergency Use Authorization (EUA). This EUA will remain  in effect (meaning this test can be used)  for the duration of the COVID-19 declaration under Se ction 564(b)(1) of the Act, 21 U.S.C. section 360bbb-3(b)(1), unless the authorization is terminated or revoked sooner.  Performed at [facility], [place] [address] [place], [place] [address]     Coagulation Studies: No results for input(s): LABPROT, INR in the last 72 hours.  Urinalysis: No results for input(s): COLORURINE, LABSPEC, PHURINE, GLUCOSEU, HGBUR, BILIRUBINUR, KETONESUR, PROTEINUR, UROBILINOGEN, NITRITE, LEUKOCYTESUR in the last 72 hours.  Invalid input(s): APPERANCEUR    Imaging: DG Swallowing Func-Speech Pathology  Result Date: 10/13/2019 Objective Swallowing Evaluation: Type of Study: MBS-Modified Barium Swallow Study  Patient Details Name: ATLAS CROSSLAND MRN: [id number] Date of Birth: 1946-02-28 Today's Date: 10/13/2019 Time: SLP Start Time (ACUTE ONLY): 0973 -SLP Stop Time (ACUTE ONLY): 1100  SLP Time Calculation (min) (ACUTE ONLY): 23 min Past Medical History: Past Medical History: Diagnosis Date . BPH (benign prostatic hypertrophy)  . Coronary artery disease  . Diabetes mellitus without complication ([place])  . Dry eyes left . Hiatal hernia  . 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  LIMA TO LAD, RIMA TO PDA, RADIAL ARTERY TO CIRC AND SEQUENTIALLY TO OM1. 08/15/2019 . Hyperlipidemia  . Hypertension  . Incomplete bladder emptying  . Nocturia  . 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 SOB (shortness of breath) on exertion  Past Surgical History: Past Surgical History: Procedure Laterality Date . APPLICATION OF WOUND VAC N/A 11/02/6281  Procedure: APPLICATION OF WOUND VAC;  Surgeon: [name], MD;  Location: [facility];  Service: Thoracic;  Laterality: N/A; . APPLICATION OF WOUND VAC  08/29/2019  Procedure: Wound Vac change;  Surgeon: [name], MD;  Location: [facility];  Service: Open  Heart Surgery;; . APPLICATION OF WOUND VAC N/A 6/62/9476  Procedure: Application Of Wound Vac;  Surgeon: Grace Zymarion, MD;  Location: [facility];  Service: Open Heart Surgery;  Laterality: N/A; . APPLICATION OF WOUND VAC N/A 5/46/5035  Procedure: APPLICATION OF ACELL, APPLICATION OF WOUND VAC USING PREVENA INCISIONAL  DRESSING;  Surgeon: Wallace Going, DO;  Location: [facility];  Service: Plastics;  Laterality: N/A; . CARDIAC CATHETERIZATION   . CORONARY ARTERY BYPASS GRAFT N/A 08/15/2019  Procedure: CORONARY ARTERY BYPASS GRAFTING (CABG), x 4 using bilateral IMAs and left radial artery .  LIMA TO LAD, RIMA TO PDA, RADIAL ARTERY TO CIRC AND SEQUENTIALLY TO OM1.;  Surgeon: [name], MD;  Location: [facility];  Service: Open Heart Surgery;  Laterality: N/A; . CORONARY STENT PLACEMENT  02/27/2014  distal rt/pd coronary       dr Einar Gip . CYSTO/ BLADDER BIOPSY'S/ CAUTHERIZATION  01-14-2004  DR Gaynelle Arabian . EXPLORATION POST OPERATIVE OPEN HEART N/A 08/16/2019  Procedure: Chest Closure S?P CABG WITH APPLICATION OF PREVENA  INCISIONAL WOUND VAC;  Surgeon: [name], MD;  Location[facility];  Service: Open Heart Surgery;  Laterality: N/A; . EXPLORATION POST OPERATIVE OPEN HEART N/A 08/21/2019  Procedure: CHEST WASHOUT S/P OPEN CHEST;  Surgeon: [name], MD;  Location: [facility];  Service: Open Heart Surgery;  Laterality: N/A;  Open chest with Esmark dressing with Ioban sealant coverage. . EXPLORATION POST OPERATIVE OPEN HEART N/A 08/18/2019  Procedure: EXPLORATION POST OPERATIVE OPEN HEART (performed 04/23 on unit);  Surgeon: [name], MD;  Location: [facility];  Service: Open Heart Surgery;  Laterality: N/A; . EXPLORATION POST OPERATIVE OPEN HEART N/A 08/24/2019  Procedure: CHEST WASHOUT POST OPERATIVE OPEN HEART;  Surgeon: [name], MD;  Location: [facility];  Service: Open Heart Surgery;  Laterality: N/A; . EXPLORATION POST OPERATIVE OPEN HEART N/A 08/29/2019  Procedure: CHEST WOUND WASHOUT POST OPERATIVE  OPEN HEART;  Surgeon: [name], MD;  Location: [facility];  Service: Open Heart Surgery;  Laterality: N/A; . EXPLORATION POST OPERATIVE OPEN HEART N/A 09/04/2019  Procedure: MEDIASTINAL EXPLORATION WITH STERNAL WOUND IRRIGATION;  Surgeon: Grace Enrico, MD;  Location: [facility];  Service: Open Heart Surgery;  Laterality: N/A; . EXPLORATION POST OPERATIVE OPEN HEART N/A 09/14/2019  Procedure: EVACUATION OF HEMATOMA;  Surgeon: Grace Luke, MD;  Location: [facility];  Service: Open Heart Surgery;  Laterality: N/A; .  IR FLUORO GUIDE CV LINE LEFT  09/19/2019 . IR GASTROSTOMY TUBE MOD SED  10/04/2019 . IR PATIENT EVAL TECH 0-60 MINS  09/29/2019 . IR US GUIDE VASC ACCESS LEFT  09/19/2019 . LAPAROSCOPIC LYSIS OF ADHESIONS N/A 09/06/2019  Procedure: LAPAROSCOPIC OMENTAL HARVEST;  Surgeon: Kinsinger, Arta Bruce, MD;  Location: [facility];  Service: General;  Laterality: N/A; . LEFT HEART CATH AND CORONARY ANGIOGRAPHY N/A 08/10/2019  Procedure: LEFT HEART CATH AND CORONARY ANGIOGRAPHY;  Surgeon: Nigel Mormon, MD;  Location: [facility];  Service: Cardiovascular;  Laterality: N/A; . LEFT HEART CATHETERIZATION WITH CORONARY ANGIOGRAM N/A 02/27/2014  Procedure: LEFT HEART CATHETERIZATION WITH CORONARY ANGIOGRAM;  Surgeon: Laverda Page, MD;  Location: [facility] LAB;  Service: Cardiovascular;  Laterality: N/A; . MEDIASTINAL EXPLORATION N/A 09/06/2019  Procedure: MEDIASTINAL EXPLORATION;  Surgeon: Grace Unknown, MD;  Location: [facility];  Service: Thoracic;  Laterality: N/A; . PECTORALIS FLAP  09/06/2019  Procedure: Pectoralis ADVANCEMENT Flap;  Surgeon: Wallace Going, DO;  Location: [facility];  Service: Plastics;; . PERCUTANEOUS CORONARY STENT INTERVENTION (PCI-S)  02/27/2014  Procedure: PERCUTANEOUS CORONARY STENT INTERVENTION (PCI-S);  Surgeon: Laverda Page, MD;  Location: [facility]ATH LAB;  Service: Cardiovascular;;  rt PDA  3.0/28mm Promus stent . RADIAL ARTERY HARVEST Left 08/15/2019  Procedure: Radial Artery Harvest;   Surgeon: [name], MD;  Location: [facility];  Service: Open Heart Surgery;  Laterality: Left; . RIB PLATING N/A 09/06/2019  Procedure: STERNAL PLATING;  Surgeon: Grace Mearl, MD;  Location: [facility];  Service: Thoracic;  Laterality: N/A; . TEE WITHOUT CARDIOVERSION N/A 08/15/2019  Procedure: TRANSESOPHAGEAL ECHOCARDIOGRAM (TEE);  Surgeon: [name], MD;  Location: [facility];  Service: Open Heart Surgery;  Laterality: N/A; . TRACHEOSTOMY TUBE PLACEMENT  08/29/2019  Procedure: Tracheostomy;  Surgeon: [name], MD;  Location: [facility];  Service: Open Heart Surgery;; . TRANSURETHRAL RESECTION OF PROSTATE  04/04/2012  Procedure: TRANSURETHRAL RESECTION OF THE PROSTATE WITH GYRUS INSTRUMENTS;  Surgeon: Ailene Rud, MD;  Location: [facility];  Service: Urology;  Laterality: N/A; . TRANSURETHRAL RESECTION OF PROSTATE N/A 09/27/2014  Procedure: TRANSURETHRAL RESECTION OF THE PROSTATE ;  Surgeon: Carolan Clines, MD;  Location: [facility];  Service: Urology;  Laterality: N/A; . UPPER GASTROINTESTINAL ENDOSCOPY   HPI: 74 yo admitted with NSTEMI 4/15, RLL PE, 4/20 CABG x 4 remained open with wound VAC with repeated washouts. 4/23 cardiac arrest, 4/29 initiated CRRT, vent s/p trach. 5/10 additional sternal washout unable to close.  5/12 sternal plating with pectoralis flap and omental harvest to mediastinum off CRRT with iHD started. Chest wound closed. Neurology consulted for left sided weakness- CTH neg 09/08/19. 5/20 pt with emergent hematoma evacuation and transition to trach collar; 5/28 downsized to #4 cuffless; ultimately decannulated. PMHx: BPH, CAD, DM, HLD, HTN Had MBS on 09/30/19, weakness, poor visualization of airway, could not recommend thin liquids. FEES repeated to determine best diet textures. PEG placed on 10/04/19. Trach decannulated on 10/05/19.  Subjective: Awake, alert Assessment / Plan / Recommendation CHL IP CLINICAL IMPRESSIONS 10/13/2019 Clinical Impression   Pt presents  with mild oropharyngeal dysphagia characterized by impaired bolus cohesion with premature spillage to the valleculae and a pharyngeal delay. Pt's swallow was triggered with the head of the bolus at the level of the valleculae with thin liquids via cup and at the pyriform sinuses with thin liquids via straw. He demonstrated penetration (PAS 3) with consecutive swallows of thin liquids via straw secondary to the  pharyngeal delay but no aspiration was noted. A regular texture diet with thin liquids is recommended at this time with observance of swallowing precautions. SLP will follow pt briefly to ensure diet tolerance.  SLP Visit Diagnosis Dysphagia, oropharyngeal phase (R13.12) Attention and concentration deficit following -- Frontal lobe and executive function deficit following -- Impact on safety and function Mild aspiration risk   CHL IP TREATMENT RECOMMENDATION 10/13/2019 Treatment Recommendations Therapy as outlined in treatment plan below   Prognosis 10/13/2019 Prognosis for Safe Diet Advancement Good Barriers to Reach Goals -- Barriers/Prognosis Comment -- [facility] IP DIET RECOMMENDATION 10/13/2019 SLP Diet Recommendations Regular solids;Thin liquid Liquid Administration via Cup;Straw Medication Administration Whole meds with liquid Compensations Slow rate;Small sips/bites Postural Changes --   [facility] IP OTHER RECOMMENDATIONS 10/13/2019 Recommended Consults -- Oral Care Recommendations Oral care BID Other Recommendations --   [facility] IP FOLLOW UP RECOMMENDATIONS 10/13/2019 Follow up Recommendations None   [facility] IP FREQUENCY AND DURATION 10/13/2019 Speech Therapy Frequency (ACUTE ONLY) min 2x/week Treatment Duration 1 week      [facility] IP ORAL PHASE 10/13/2019 Oral Phase Impaired Oral - Pudding Teaspoon -- Oral - Pudding Cup -- Oral - Honey Teaspoon -- Oral - Honey Cup -- Oral - Nectar Teaspoon NT Oral - Nectar Cup -- Oral - Nectar Straw NT Oral - Thin Teaspoon -- Oral - Thin Cup Premature spillage;Decreased bolus cohesion Oral - Thin  Straw Premature spillage;Decreased bolus cohesion Oral - Puree WFL Oral - Mech Soft -- Oral - Regular -- Oral - Multi-Consistency -- Oral - Pill -- Oral Phase - Comment --  [facility] IP PHARYNGEAL PHASE 10/13/2019 Pharyngeal Phase Impaired Pharyngeal- Pudding Teaspoon -- Pharyngeal -- Pharyngeal- Pudding Cup -- Pharyngeal -- Pharyngeal- Honey Teaspoon -- Pharyngeal -- Pharyngeal- Honey Cup -- Pharyngeal -- Pharyngeal- Nectar Teaspoon NT Pharyngeal -- Pharyngeal- Nectar Cup -- Pharyngeal -- Pharyngeal- Nectar Straw -- Pharyngeal -- Pharyngeal- Thin Teaspoon NT Pharyngeal -- Pharyngeal- Thin Cup Delayed swallow initiation-vallecula Pharyngeal -- Pharyngeal- Thin Straw Reduced epiglottic inversion;Penetration/Aspiration during swallow;Delayed swallow initiation-pyriform sinuses Pharyngeal Material enters airway, remains ABOVE vocal cords and not ejected out Pharyngeal- Puree WFL Pharyngeal Material does not enter airway Pharyngeal- Mechanical Soft -- Pharyngeal -- Pharyngeal- Regular WFL Pharyngeal -- Pharyngeal- Multi-consistency -- Pharyngeal -- Pharyngeal- Pill -- Pharyngeal -- Pharyngeal Comment --  [facility] IP CERVICAL ESOPHAGEAL PHASE 10/13/2019 Cervical Esophageal Phase WFL Pudding Teaspoon -- Pudding Cup -- Honey Teaspoon -- Honey Cup -- Nectar Teaspoon -- Nectar Cup -- Nectar Straw -- Thin Teaspoon -- Thin Cup -- Thin Straw -- Puree -- Mechanical Soft -- Regular -- Multi-consistency -- Pill -- Cervical Esophageal Comment -- Shanika I. Hardin Negus, [place], [place] Office number [phone] Pager [phone] Horton Marshall 10/13/2019, 11:42 AM                Medications:   . sodium chloride Stopped (09/14/19 0802)  . sodium chloride    . sodium chloride    . lactated ringers 20 mL/hr at 08/20/19 1200   .  stroke: mapping our early stages of recovery book   Does not apply Once  . sodium chloride   Intravenous Once  . amiodarone  200 mg Per Tube Daily  . arformoterol  15 mcg  Nebulization BID  . aspirin  81 mg Per Tube Daily  . atorvastatin  40 mg Per Tube Daily  . bisacodyl  10 mg Rectal Daily  . chlorhexidine  15 mL Mouth Rinse BID  . Chlorhexidine Gluconate Cloth  6 each Topical  [id number]  . Chlorhexidine Gluconate Cloth  6 each Topical Q0600  . Chlorhexidine Gluconate Cloth  6 each Topical Q0600  . Chlorhexidine Gluconate Cloth  6 each Topical Q0600  . clonazePAM  0.5 mg Per Tube BID  . darbepoetin (ARANESP) injection - DIALYSIS  100 mcg Intravenous Q Tue-HD  . feeding supplement (NEPRO CARB STEADY)  720 mL Per Tube Q24H  . feeding supplement (PRO-STAT SUGAR FREE 64)  60 mL Per Tube BID  . insulin aspart  0-15 Units Subcutaneous Q4H  . insulin detemir  12 Units Subcutaneous BID  . mouth rinse  15 mL Mouth Rinse q12n4p  . multivitamin  1 tablet Per Tube QHS  . nystatin  5 mL Oral QID  . pantoprazole  40 mg Oral Daily  . revefenacin  175 mcg Nebulization Daily  . sertraline  25 mg Per Tube Daily  . sevelamer carbonate  1.6 g Per Tube TID WC  . trospium  20 mg Per Tube BID   sodium chloride, sodium chloride, acetaminophen (TYLENOL) oral liquid 160 mg/5 mL, albuterol, alteplase, dextrose, Gerhardt's butt cream, guaiFENesin, heparin, hydrALAZINE, HYDROcodone-acetaminophen, hydrocortisone cream, HYDROmorphone (DILAUDID) injection, HYDROmorphone (DILAUDID) injection, lidocaine (PF), lidocaine-prilocaine, ondansetron (ZOFRAN) IV, ondansetron (ZOFRAN) IV, oxyCODONE, pentafluoroprop-tetrafluoroeth, phenol, Resource ThickenUp Clear, sennosides, sodium chloride flush  Assessment/ Plan:   Acute kidney injury status post cardiac catheterization, CABG, cardiogenic shock and cardiac arrest.  Started CRRT 08/24/2019 until 09/06/2019 for volume control.  Now tolerating intermittent dialysis.  His last dialysis treatment was 10/12/2019 with ultrafiltration of 1.7 L    .  Continues to be anuric.  Patient is receiving dialysis 10/14/2019.  Appreciate assistance from renal navigator.   Appreciate assistance from Dr. Royce Macadamia medical director of [facility].  Patient appears to be tolerating reclining wheelchair for dialysis.  We shall finally make arrangements for outpatient placement.  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  Appears to be tolerating well with dialysis.  .  Chest x-ray does not show any significant pulmonary congestion 10/09/2019  Coronary artery disease status post CABG complicated by cardiac tamponade status post reopening chest urgently 08/18/2019 with wound VAC.  Most recently went back to the OR on 5/12 and 09/14/2019.  Continues on aspirin and atorvastatin  Atrial fibrillation per primary service  Last 2D echo showed systolic heart failure 4/65/0354 ejection fraction of about 40 to 45%.   LOS: [place] @TODAY @8 :01 AM

## 2019-10-14 NOTE — Procedures (Signed)
I have seen and examined this patient and agree with the plan of care.  I was present during her dialysis treatments of 10/14/2019  Sherril Croon 10/14/2019, 8:04 AM

## 2019-10-15 LAB — GLUCOSE, CAPILLARY
Glucose-Capillary: 107 mg/dL — ABNORMAL HIGH (ref 70–99)
Glucose-Capillary: 77 mg/dL (ref 70–99)
Glucose-Capillary: 78 mg/dL (ref 70–99)
Glucose-Capillary: 114 mg/dL — ABNORMAL HIGH (ref 70–99)
Glucose-Capillary: 191 mg/dL — ABNORMAL HIGH (ref 70–99)
Glucose-Capillary: 73 mg/dL (ref 70–99)

## 2019-10-15 LAB — RENAL FUNCTION PANEL
Albumin: 2.6 g/dL — ABNORMAL LOW (ref 3.5–5.0)
Anion gap: 12 (ref 5–15)
BUN: 58 mg/dL — ABNORMAL HIGH (ref 8–23)
CO2: 23 mmol/L (ref 22–32)
Calcium: 9.4 mg/dL (ref 8.9–10.3)
Chloride: 96 mmol/L — ABNORMAL LOW (ref 98–111)
Creatinine, Ser: 4.63 mg/dL — ABNORMAL HIGH (ref 0.61–1.24)
GFR calc Af Amer: 14 mL/min — ABNORMAL LOW (ref >60)
GFR calc non Af Amer: 12 mL/min — ABNORMAL LOW (ref >60)
Glucose, Bld: 123 mg/dL — ABNORMAL HIGH (ref 70–99)
Phosphorus: 4.8 mg/dL — ABNORMAL HIGH (ref 2.5–4.6)
Potassium: 3.2 mmol/L — ABNORMAL LOW (ref 3.5–5.1)
Sodium: 131 mmol/L — ABNORMAL LOW (ref 135–145)

## 2019-10-15 LAB — MAGNESIUM: Magnesium: 2.1 mg/dL (ref 1.7–2.4)

## 2019-10-15 NOTE — Progress Notes (Signed)
Orthopedic Tech Progress Note Patient Details:  BRENDT DIBLE January 14, 1946 [id number]  Ortho Devices Type of Ortho Device: Louretta Parma boot Ortho Device/Splint Location: Bilateral Lower Extremity Ortho Device/Splint Interventions: Ordered, Application   Post Interventions Patient Tolerated: Well Instructions Provided: Adjustment of device, Care of device   Iana Buzan P Lorel Monaco 10/15/2019, 3:19 PM

## 2019-10-15 NOTE — Progress Notes (Addendum)
Patient doesn't want to wear moon boots any more, does not like sequential wraps---I have gotten verbal order from Christian [name], [place] to have unna boots ordered and placed by ortho tech---also asked patient if he would like to be moved to chair with lift assist and he did not want to move from bed to chair

## 2019-10-15 NOTE — Plan of Care (Signed)
  Problem: Education: Goal: Knowledge of General Education information will improve Description: Including pain rating scale, medication(s)/side effects and non-pharmacologic comfort measures Outcome: Progressing   Problem: Health Behavior/Discharge Planning: Goal: Ability to manage health-related needs will improve Outcome: Progressing   Problem: Clinical Measurements: Goal: Ability to maintain clinical measurements within normal limits will improve Outcome: Progressing Goal: Will remain free from infection Outcome: Progressing Goal: Diagnostic test results will improve Outcome: Progressing Goal: Respiratory complications will improve Outcome: Progressing Goal: Cardiovascular complication will be avoided Outcome: Progressing   Problem: Activity: Goal: Risk for activity intolerance will decrease Outcome: Progressing   Problem: Nutrition: Goal: Adequate nutrition will be maintained Outcome: Progressing   Problem: Coping: Goal: Level of anxiety will decrease Outcome: Progressing   Problem: Elimination: Goal: Will not experience complications related to bowel motility Outcome: Progressing Goal: Will not experience complications related to urinary retention Outcome: Progressing   Problem: Pain Managment: Goal: General experience of comfort will improve Outcome: Progressing   Problem: Safety: Goal: Ability to remain free from injury will improve Outcome: Progressing   Problem: Skin Integrity: Goal: Risk for impaired skin integrity will decrease Outcome: Progressing   Problem: Education: Goal: Will demonstrate proper wound care and an understanding of methods to prevent future damage Outcome: Progressing Goal: Knowledge of disease or condition will improve Outcome: Progressing Goal: Knowledge of the prescribed therapeutic regimen will improve Outcome: Progressing Goal: Individualized Educational Video(s) Outcome: Progressing   Problem: Activity: Goal: Risk for  activity intolerance will decrease Outcome: Progressing   Problem: Cardiac: Goal: Will achieve and/or maintain hemodynamic stability Outcome: Progressing   Problem: Clinical Measurements: Goal: Postoperative complications will be avoided or minimized Outcome: Progressing   Problem: Respiratory: Goal: Respiratory status will improve Outcome: Progressing   Problem: Skin Integrity: Goal: Wound healing without signs and symptoms of infection Outcome: Progressing Goal: Risk for impaired skin integrity will decrease Outcome: Progressing   Problem: Urinary Elimination: Goal: Ability to achieve and maintain adequate renal perfusion and functioning will improve Outcome: Progressing   

## 2019-10-15 NOTE — Progress Notes (Addendum)
      [address]       [place] [address]             [phone]      31 Days Post-Op Procedure(s) (LRB): EVACUATION OF HEMATOMA (N/A) Subjective: Feels better this morning since it isn't a dialysis day. He is complaining of some incisional pain that just began a few minutes ago.   Objective: Vital signs in last 24 hours: Temp:  [98 F (36.7 C)-98.6 F (37 C)] 98.1 F (36.7 C) (06/20 0744) Pulse Rate:  [101-110] 105 (06/20 0744) Cardiac Rhythm: Sinus tachycardia (06/20 0708) Resp:  [13-21] 19 (06/20 0744) BP: (98-130)/(61-87) 129/82 (06/20 0744) SpO2:  [97 %-100 %] 100 % (06/20 0824)     Intake/Output from previous day: 06/19 0701 - 06/20 0700 In: 120 [P.O.:120] Out: 2050 [Urine:50] Intake/Output this shift: Total I/O In: 250 [P.O.:240; I.V.:10] Out: 0   General appearance: alert, cooperative and no distress Heart: sinus tachycardia Lungs: clear to auscultation bilaterally Abdomen: no tenderness Extremities: extremities normal, atraumatic, no cyanosis or edema Wound: clean and dry with a few nylon sutures in place  Lab Results: Recent Labs    10/14/19 0727  WBC 14.2*  HGB 9.2*  HCT 30.2*  PLT 377   BMET:  Recent Labs    10/13/19 0227 10/14/19 0237  NA 134* 129*  K 3.7 4.0  CL 96* 92*  CO2 26 24  GLUCOSE 141* 153*  BUN 50* 85*  CREATININE 3.77* 5.48*  CALCIUM 9.4 9.2    PT/INR: No results for input(s): LABPROT, INR in the last 72 hours. ABG    Component Value Date/Time   PHART 7.481 (H) 09/07/2019 0029   HCO3 23.3 09/07/2019 0029   TCO2 24 09/07/2019 0029   ACIDBASEDEF 5.0 (H) 08/18/2019 2341   O2SAT 99.0 09/07/2019 0029   CBG (last 3)  Recent Labs    10/14/19 2001 10/14/19 2352 10/15/19 0326  GLUCAP 130* 93 107*    Assessment/Plan: S/P Procedure(s) (LRB): EVACUATION OF HEMATOMA (N/A)  1. Hemodynamically stable in sinus tachycardia. Rate low 100s. BP stable 2. Pulm-tolerating room air with excellent oxygen  saturation 3. Renal-ESRF on dialysis. Per nephrology 4. H and H 9.2/30.2, expected acute blood loss anemia 5. Endo-blood glucose well controlled 6. Feeding tube in place. SLP with recent recs-regular diet, thin liquids. Meds with puree.  7. Wound-painted with betadine today. There are several nylon sutures in place that are embedded into the skin. These will need to be removed before discharge to SNF.   Plan: Continue current medical care plan. Hopeful for SNF Monday with outpatient dialysis.    LOS: 67 days    Elgie Collard 10/15/2019   Chart reviewed, patient examined, agree with above. His chest incision is healing well overall. Some eschar in the middle but dry. Few nylon sutures remain that can be removed.

## 2019-10-15 NOTE — Progress Notes (Signed)
[facility] ROUNDING NOTE   Subjective:   This is a 74 year old gentleman status post CABG status post cardiac arrest due to tamponade developed acute kidney injury receiving dialysis.  Previous creatinine was 1.17 July 2019.  Started on CRRT 08/24/2019 to 09/06/2019 to manage volume.  Now tolerating intermittent hemodialysis through a right IJ TDC placed by interventional radiology 09/19/2019.  Physical therapy assessment appreciate assistance from [facility] recommendations for SNF placement.  Patient beyond level of care that would be provided by LTAC.  Last dialysis 10/14/2019 with 2.1 L removed.  Next dialysis will be scheduled for 10/17/2019  Blood pressure 130/87 pulse 101 temperature 98 O2 sats 98% room air  Hemoglobin 9.2 sodium 129 potassium 4 chloride 92 CO2 24 BUN 85 creatinine 5.48 glucose 153 calcium 9.2 phosphorus 4.7 magnesium 2.5 albumin 2.5 hemoglobin 9.2  Last chest x-ray 10/08/2019 small left   pleural effusion  Amiodarone 200 mg daily, aspirin 81 mg daily atorvastatin 40 mg daily, darbepoetin 100 mcg every Tuesday last administered 10/10/2019, insulin sliding scale, Levemir 12 units twice daily, Protonix 40 mg daily, Renvela 1.6 g 3 times daily, Sanctura 20 mg twice daily  Objective:  Vital signs in last 24 hours:  Temp:  [98 F (36.7 C)-98.6 F (37 C)] 98 F (36.7 C) (06/20 0328) Pulse Rate:  [97-110] 101 (06/20 0328) Resp:  [13-21] 16 (06/20 0328) BP: (98-144)/(58-87) 130/87 (06/20 0328) SpO2:  [97 %-100 %] 99 % (06/20 0328)  Weight change:  Filed Weights   10/10/19 0745 10/13/19 0425 10/14/19 0354  Weight: 84.4 kg 83.6 kg 84.7 kg    Intake/Output: I/O last 3 completed shifts: In: 1044 [P.O.:600; NG/GT:444] Out: 2075 [Urine:75; Other:2000]   Intake/Output this shift:  Total I/O In: -  Out: 59 [Urine:50]  General:NAD, not in distress, lying on bed comfortable Heart:RRR, s1s2 nl no rubs Lungs: Clear bilateral, no wheezing  Abdomen:soft, Non-tender, non-distended, gastric feeding tube Extremities:No edema Dialysis Access: Right IJ TDC, clean site with no evidence of bleeding.   Basic Metabolic Panel: Recent Labs  Lab 10/10/19 0500 10/10/19 0500 10/11/19 0310 10/11/19 0310 10/12/19 0248 10/13/19 0227 10/14/19 0237  NA 126*  --  134*  --  131* 134* 129*  K 4.4  --  3.8  --  3.9 3.7 4.0  CL 87*  --  95*  --  93* 96* 92*  CO2 23  --  22  --  24 26 24   GLUCOSE 143*  --  200*  --  161* 141* 153*  BUN 128*  --  59*  --  95* 50* 85*  CREATININE 6.91*  --  4.25*  --  5.66* 3.77* 5.48*  CALCIUM 9.3   < > 9.2   < > 9.4 9.4 9.2  MG 2.8*  --  2.3  --  2.5* 2.3 2.4  PHOS 6.7*  --  5.3*  --  5.4* 3.8 4.7*   < > = values in this interval not displayed.    Liver Function Tests: Recent Labs  Lab 10/10/19 0500 10/11/19 0310 10/12/19 0248 10/13/19 0227 10/14/19 0237  ALBUMIN 2.5* 2.4* 2.5* 2.5* 2.5*   No results for input(s): LIPASE, AMYLASE in the last 168 hours. No results for input(s): AMMONIA in the last 168 hours.  CBC: Recent Labs  Lab 10/10/19 0500 10/12/19 0739 10/14/19 0727  WBC 16.2* 14.6* 14.2*  HGB 9.4* 9.3* 9.2*  HCT 29.4* 30.3* 30.2*  MCV 98.7 101.0* 101.3*  PLT 406* 369 377  Cardiac Enzymes: No results for input(s): CKTOTAL, CKMB, CKMBINDEX, TROPONINI in the last 168 hours.  BNP: Invalid input(s): POCBNP  CBG: Recent Labs  Lab 10/14/19 1202 10/14/19 1617 10/14/19 2001 10/14/19 2352 10/15/19 0326  GLUCAP 113* 171* 130* 93 107*    Microbiology: Results for orders placed or performed during the hospital encounter of 08/09/19  Respiratory Panel by RT PCR (Flu A&B, Covid) - Nasopharyngeal Swab     Status: None   Collection Time: 08/09/19  3:31 PM   Specimen: Nasopharyngeal Swab  Result Value Ref Range Status   SARS Coronavirus 2 by RT PCR NEGATIVE NEGATIVE Final    Comment: (NOTE) SARS-CoV-2 target nucleic acids are NOT DETECTED. The SARS-CoV-2 RNA is generally  detectable in upper respiratoy specimens during the acute phase of infection. The lowest concentration of SARS-CoV-2 viral copies this assay can detect is 131 copies/mL. A negative result does not preclude SARS-Cov-2 infection and should not be used as the sole basis for treatment or other patient management decisions. A negative result may occur with  improper specimen collection/handling, submission of specimen other than nasopharyngeal swab, presence of viral mutation(s) within the areas targeted by this assay, and inadequate number of viral copies (<131 copies/mL). A negative result must be combined with clinical observations, patient history, and epidemiological information. The expected result is Negative. Fact Sheet for Patients:  PinkCheek.be Fact Sheet for Healthcare Providers:  GravelBags.it This test is not yet ap proved or cleared by the [place] FDA and  has been authorized for detection and/or diagnosis of SARS-CoV-2 by FDA under an Emergency Use Authorization (EUA). This EUA will remain  in effect (meaning this test can be used) for the duration of the COVID-19 declaration under Section 564(b)(1) of the Act, 21 U.S.C. section 360bbb-3(b)(1), unless the authorization is terminated or revoked sooner.    Influenza A by PCR NEGATIVE NEGATIVE Final   Influenza B by PCR NEGATIVE NEGATIVE Final    Comment: (NOTE) The Xpert Xpress SARS-CoV-2/FLU/RSV assay is intended as an aid in  the diagnosis of influenza from Nasopharyngeal swab specimens and  should not be used as a sole basis for treatment. Nasal washings and  aspirates are unacceptable for Xpert Xpress SARS-CoV-2/FLU/RSV  testing. Fact Sheet for Patients: PinkCheek.be Fact Sheet for Healthcare Providers: GravelBags.it This test is not yet approved or cleared by the [place] FDA and  has been  authorized for detection and/or diagnosis of SARS-CoV-2 by  FDA under an Emergency Use Authorization (EUA). This EUA will remain  in effect (meaning this test can be used) for the duration of the  Covid-19 declaration under Section 564(b)(1) of the Act, 21  U.S.C. section 360bbb-3(b)(1), unless the authorization is  terminated or revoked. Performed at [facility], [place] [address] [place], [place] [address]   Surgical pcr screen     Status: None   Collection Time: 08/14/19 10:20 PM   Specimen: Nasal Mucosa; Nasal Swab  Result Value Ref Range Status   MRSA, PCR NEGATIVE NEGATIVE Final   Staphylococcus aureus NEGATIVE NEGATIVE Final    Comment: (NOTE) The Xpert SA Assay (FDA approved for NASAL specimens in patients 30 years of age and older), is one component of a comprehensive surveillance program. It is not intended to diagnose infection nor to guide or monitor treatment. Performed at [facility], [place] [address], [place], [place] [address]   Culture, blood (routine x 2)     Status: None   Collection Time: 08/17/19 11:32  AM   Specimen: BLOOD RIGHT HAND  Result Value Ref Range Status   Specimen Description BLOOD RIGHT HAND  Final   Special Requests   Final    BOTTLES DRAWN AEROBIC AND ANAEROBIC Blood Culture adequate volume   Culture   Final    NO GROWTH 5 DAYS Performed at [facility], [address], [place], [place] [address]    Report Status 08/22/2019 FINAL  Final  Culture, blood (routine x 2)     Status: None   Collection Time: 08/17/19 11:39 AM   Specimen: BLOOD RIGHT HAND  Result Value Ref Range Status   Specimen Description BLOOD RIGHT HAND  Final   Special Requests   Final    BOTTLES DRAWN AEROBIC AND ANAEROBIC Blood Culture results may not be optimal due to an inadequate volume of blood received in culture bottles   Culture   Final    NO GROWTH 5 DAYS Performed at [facility], [place] [address], [place], [place] [address]    Report Status  08/22/2019 FINAL  Final  Culture, respiratory (non-expectorated)     Status: None   Collection Time: 08/27/19  7:42 AM   Specimen: Tracheal Aspirate; Respiratory  Result Value Ref Range Status   Specimen Description TRACHEAL ASPIRATE  Final   Special Requests Immunocompromised  Final   Gram Stain   Final    RARE WBC PRESENT, PREDOMINANTLY PMN RARE GRAM POSITIVE RODS    Culture   Final    FEW Consistent with normal respiratory flora. Performed at [facility], [place] [address] [place], [place] [address]    Report Status 08/29/2019 FINAL  Final  Culture, blood (routine x 2)     Status: None   Collection Time: 08/27/19 10:30 AM   Specimen: BLOOD RIGHT HAND  Result Value Ref Range Status   Specimen Description BLOOD RIGHT HAND  Final   Special Requests   Final    BOTTLES DRAWN AEROBIC AND ANAEROBIC Blood Culture adequate volume   Culture   Final    NO GROWTH 5 DAYS Performed at [facility], [place] [address], [place], [place] [address]    Report Status 09/01/2019 FINAL  Final  Culture, blood (routine x 2)     Status: None   Collection Time: 08/27/19 12:20 PM   Specimen: BLOOD RIGHT HAND  Result Value Ref Range Status   Specimen Description BLOOD RIGHT HAND  Final   Special Requests   Final    BOTTLES DRAWN AEROBIC ONLY Blood Culture results may not be optimal due to an inadequate volume of blood received in culture bottles   Culture   Final    NO GROWTH 5 DAYS Performed at [facility], [place] [address] [place], [place] [address]    Report Status 09/01/2019 FINAL  Final  Culture, blood (routine x 2)     Status: None   Collection Time: 08/31/19  8:50 AM   Specimen: BLOOD RIGHT HAND  Result Value Ref Range Status   Specimen Description BLOOD RIGHT HAND  Final   Special Requests   Final    BOTTLES DRAWN AEROBIC ONLY Blood Culture results may not be optimal due to an inadequate volume of blood received in culture bottles   Culture   Final    NO GROWTH 5 DAYS  Performed at [facility], [place] [address] [place], [place] [address]    Report Status 09/05/2019 FINAL  Final  Culture, blood (  routine x 2)     Status: None   Collection Time: 08/31/19  9:01 AM   Specimen: BLOOD LEFT HAND  Result Value Ref Range Status   Specimen Description BLOOD LEFT HAND  Final   Special Requests   Final    BOTTLES DRAWN AEROBIC ONLY Blood Culture results may not be optimal due to an inadequate volume of blood received in culture bottles   Culture   Final    NO GROWTH 5 DAYS Performed at [facility], [place] [address], [place], [place] [address]    Report Status 09/05/2019 FINAL  Final  Culture, respiratory (non-expectorated)     Status: None   Collection Time: 08/31/19  9:05 AM   Specimen: Tracheal Aspirate; Respiratory  Result Value Ref Range Status   Specimen Description TRACHEAL ASPIRATE  Final   Special Requests NONE  Final   Gram Stain   Final    NO WBC SEEN FEW GRAM NEGATIVE RODS RARE GRAM POSITIVE COCCI RARE GRAM POSITIVE RODS RARE GRAM VARIABLE ROD Performed at [facility], [place] [address] [place], [place] [address]    Culture RARE PSEUDOMONAS AERUGINOSA  Final   Report Status 09/02/2019 FINAL  Final   Organism ID, Bacteria PSEUDOMONAS AERUGINOSA  Final      Susceptibility   Pseudomonas aeruginosa - MIC*    CEFTAZIDIME <=1 SENSITIVE Sensitive     CIPROFLOXACIN <=0.25 SENSITIVE Sensitive     GENTAMICIN <=1 SENSITIVE Sensitive     IMIPENEM 2 SENSITIVE Sensitive     PIP/TAZO <=4 SENSITIVE Sensitive     CEFEPIME <=1 SENSITIVE Sensitive     * RARE PSEUDOMONAS AERUGINOSA  Aerobic Culture (superficial specimen)     Status: None   Collection Time: 09/04/19  8:22 AM   Specimen: Wound  Result Value Ref Range Status   Specimen Description WOUND  Final   Special Requests STERNAL WOUND SPEC A  Final   Gram Stain NO WBC SEEN NO ORGANISMS SEEN   Final   Culture   Final    NO GROWTH 2 DAYS Performed at [facility], [address] [place], [place] [address]    Report Status 09/06/2019 FINAL  Final  Culture, blood (routine x 2)     Status: None   Collection Time: 09/08/19  9:23 AM   Specimen: BLOOD LEFT HAND  Result Value Ref Range Status   Specimen Description BLOOD LEFT HAND  Final   Special Requests   Final    BOTTLES DRAWN AEROBIC ONLY Blood Culture adequate volume   Culture   Final    NO GROWTH 5 DAYS Performed at [facility], [place] [address], [place], [place] [address]    Report Status 09/13/2019 FINAL  Final  Culture, blood (routine x 2)     Status: None   Collection Time: 09/08/19  9:27 AM   Specimen: BLOOD RIGHT HAND  Result Value Ref Range Status   Specimen Description BLOOD RIGHT HAND  Final   Special Requests   Final    BOTTLES DRAWN AEROBIC ONLY Blood Culture adequate volume   Culture   Final    NO GROWTH 5 DAYS Performed at [facility], [place] [address] [place], [place] [address]    Report Status 09/13/2019 FINAL  Final  Culture, respiratory (non-expectorated)     Status: None   Collection Time: 09/08/19  3:17 PM   Specimen: Tracheal Aspirate; Respiratory  Result Value Ref Range Status  Specimen Description TRACHEAL ASPIRATE  Final   Special Requests NONE  Final   Gram Stain NO RBC SEEN RARE GRAM POSITIVE COCCI IN PAIRS   Final   Culture   Final    Consistent with normal respiratory flora. Performed at [facility], [place] [address], [place], [place] [address]    Report Status 09/11/2019 FINAL  Final  Aerobic/Anaerobic Culture (surgical/deep wound)     Status: None   Collection Time: 09/14/19  9:17 AM   Specimen: Soft Tissue, Other  Result Value Ref Range Status   Specimen Description TISSUE  Final   Special Requests HEMATOMA FROM CHEST SPEC A  Final   Gram Stain   Final    FEW WBC PRESENT, PREDOMINANTLY PMN NO ORGANISMS SEEN    Culture   Final    No growth aerobically or anaerobically. Performed at [facility], [place] [address], [place], [place]  [address]    Report Status 09/19/2019 FINAL  Final  Surgical PCR screen     Status: None   Collection Time: 09/23/19  2:48 PM   Specimen: Nasal Mucosa; Nasal Swab  Result Value Ref Range Status   MRSA, PCR NEGATIVE NEGATIVE Final   Staphylococcus aureus NEGATIVE NEGATIVE Final    Comment: (NOTE) The Xpert SA Assay (FDA approved for NASAL specimens in patients 64 years of age and older), is one component of a comprehensive surveillance program. It is not intended to diagnose infection nor to guide or monitor treatment. Performed at [facility], [place] [address] [place], [place] [address]   SARS CORONAVIRUS 2 (TAT 6-24 HRS) Nasopharyngeal Nasopharyngeal Swab     Status: None   Collection Time: 10/13/19 12:17 PM   Specimen: Nasopharyngeal Swab  Result Value Ref Range Status   SARS Coronavirus 2 NEGATIVE NEGATIVE Final    Comment: (NOTE) SARS-CoV-2 target nucleic acids are NOT DETECTED.  The SARS-CoV-2 RNA is generally detectable in upper and lower respiratory specimens during the acute phase of infection. Negative results do not preclude SARS-CoV-2 infection, do not rule out co-infections with other pathogens, and should not be used as the sole basis for treatment or other patient management decisions. Negative results must be combined with clinical observations, patient history, and epidemiological information. The expected result is Negative.  Fact Sheet for Patients: SugarRoll.be  Fact Sheet for Healthcare Providers: https://www.woods-mathews.com/  This test is not yet approved or cleared by the [place] FDA and  has been authorized for detection and/or diagnosis of SARS-CoV-2 by FDA under an Emergency Use Authorization (EUA). This EUA will remain  in effect (meaning this test can be used) for the duration of the COVID-19 declaration under Se ction 564(b)(1) of the Act, 21 U.S.C. section 360bbb-3(b)(1), unless the  authorization is terminated or revoked sooner.  Performed at [facility], [place] [address], [place], [place] [address]     Coagulation Studies: No results for input(s): LABPROT, INR in the last 72 hours.  Urinalysis: No results for input(s): COLORURINE, LABSPEC, PHURINE, GLUCOSEU, HGBUR, BILIRUBINUR, KETONESUR, PROTEINUR, UROBILINOGEN, NITRITE, LEUKOCYTESUR in the last 72 hours.  Invalid input(s): APPERANCEUR    Imaging: DG Swallowing Func-Speech Pathology  Result Date: 10/13/2019 Objective Swallowing Evaluation: Type of Study: MBS-Modified Barium Swallow Study  Patient Details Name: AMORI COLOMB MRN: [id number] Date of Birth: 1946-01-06 Today's Date: 10/13/2019 Time: SLP Start Time (ACUTE ONLY): 1037 -SLP Stop Time (ACUTE ONLY): 1100 SLP Time Calculation (min) (ACUTE ONLY): 23 min Past Medical History: Past  Medical History: Diagnosis Date . BPH (benign prostatic hypertrophy)  . Coronary artery disease  . Diabetes mellitus without complication ([place])  . Dry eyes left . Hiatal hernia  . 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  LIMA TO LAD, RIMA TO PDA, RADIAL ARTERY TO CIRC AND SEQUENTIALLY TO OM1. 08/15/2019 . Hyperlipidemia  . Hypertension  . Incomplete bladder emptying  . Nocturia  . 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 SOB (shortness of breath) on exertion  Past Surgical History: Past Surgical History: Procedure Laterality Date . APPLICATION OF WOUND VAC N/A 2/62/0355  Procedure: APPLICATION OF WOUND VAC;  Surgeon: [name], MD;  Location: [place];  Service: Thoracic;  Laterality: N/A; . APPLICATION OF WOUND VAC  08/29/2019  Procedure: Wound Vac change;  Surgeon: [name], MD;  Location: [place] [facility];  Service: Open Heart Surgery;; . APPLICATION OF WOUND VAC N/A 9/74/1638  Procedure: Application Of Wound Vac;  Surgeon: Grace Bricyn, MD;   Location: [place];  Service: Open Heart Surgery;  Laterality: N/A; . APPLICATION OF WOUND VAC N/A 4/53/6468  Procedure: APPLICATION OF ACELL, APPLICATION OF WOUND VAC USING PREVENA INCISIONAL  DRESSING;  Surgeon: Wallace Going, DO;  Location: [place];  Service: Plastics;  Laterality: N/A; . CARDIAC CATHETERIZATION   . CORONARY ARTERY BYPASS GRAFT N/A 08/15/2019  Procedure: CORONARY ARTERY BYPASS GRAFTING (CABG), x 4 using bilateral IMAs and left radial artery .  LIMA TO LAD, RIMA TO PDA, RADIAL ARTERY TO CIRC AND SEQUENTIALLY TO OM1.;  Surgeon: [name], MD;  Location: [place];  Service: Open Heart Surgery;  Laterality: N/A; . CORONARY STENT PLACEMENT  02/27/2014  distal rt/pd coronary       dr Einar Gip . CYSTO/ BLADDER BIOPSY'S/ CAUTHERIZATION  01-14-2004  DR Gaynelle Arabian . EXPLORATION POST OPERATIVE OPEN HEART N/A 08/16/2019  Procedure: Chest Closure S?P CABG WITH APPLICATION OF PREVENA  INCISIONAL WOUND VAC;  Surgeon: [name], MD;  Location: [facility];  Service: Open Heart Surgery;  Laterality: N/A; . EXPLORATION POST OPERATIVE OPEN HEART N/A 08/21/2019  Procedure: CHEST WASHOUT S/P OPEN CHEST;  Surgeon: [name], MD;  Location: [place];  Service: Open Heart Surgery;  Laterality: N/A;  Open chest with Esmark dressing with Ioban sealant coverage. . EXPLORATION POST OPERATIVE OPEN HEART N/A 08/18/2019  Procedure: EXPLORATION POST OPERATIVE OPEN HEART (performed 04/23 on unit);  Surgeon: [name], MD;  Location: [place];  Service: Open Heart Surgery;  Laterality: N/A; . EXPLORATION POST OPERATIVE OPEN HEART N/A 08/24/2019  Procedure: CHEST WASHOUT POST OPERATIVE OPEN HEART;  Surgeon: [name], MD;  Location: [place];  Service: Open Heart Surgery;  Laterality: N/A; . EXPLORATION POST OPERATIVE OPEN HEART N/A 08/29/2019  Procedure: CHEST WOUND WASHOUT POST OPERATIVE OPEN HEART;  Surgeon: [name], MD;  Location: [place];  Service: Open Heart Surgery;  Laterality: N/A; . EXPLORATION POST  OPERATIVE OPEN HEART N/A 09/04/2019  Procedure: MEDIASTINAL EXPLORATION WITH STERNAL WOUND IRRIGATION;  Surgeon: Grace Kraig, MD;  Location: [place];  Service: Open Heart Surgery;  Laterality: N/A; . EXPLORATION POST OPERATIVE OPEN HEART N/A 09/14/2019  Procedure: EVACUATION OF HEMATOMA;  Surgeon: Grace Markcus, MD;  Location: [place];  Service: Open Heart Surgery;  Laterality: N/A; . IR FLUORO GUIDE CV LINE LEFT  09/19/2019 . IR GASTROSTOMY TUBE  MOD SED  10/04/2019 . IR PATIENT EVAL TECH 0-60 MINS  09/29/2019 . IR US GUIDE VASC ACCESS LEFT  09/19/2019 . LAPAROSCOPIC LYSIS OF ADHESIONS N/A 09/06/2019  Procedure: LAPAROSCOPIC OMENTAL HARVEST;  Surgeon: Kinsinger, Arta Bruce, MD;  Location: [place];  Service: General;  Laterality: N/A; . LEFT HEART CATH AND CORONARY ANGIOGRAPHY N/A 08/10/2019  Procedure: LEFT HEART CATH AND CORONARY ANGIOGRAPHY;  Surgeon: Nigel Mormon, MD;  Location: [place] [facility];  Service: Cardiovascular;  Laterality: N/A; . LEFT HEART CATHETERIZATION WITH CORONARY ANGIOGRAM N/A 02/27/2014  Procedure: LEFT HEART CATHETERIZATION WITH CORONARY ANGIOGRAM;  Surgeon: Laverda Page, MD;  Location: [facility] CATH LAB;  Service: Cardiovascular;  Laterality: N/A; . MEDIASTINAL EXPLORATION N/A 09/06/2019  Procedure: MEDIASTINAL EXPLORATION;  Surgeon: Grace Jamey, MD;  Location: [place];  Service: Thoracic;  Laterality: N/A; . PECTORALIS FLAP  09/06/2019  Procedure: Pectoralis ADVANCEMENT Flap;  Surgeon: Wallace Going, DO;  Location: [place];  Service: Plastics;; . PERCUTANEOUS CORONARY STENT INTERVENTION (PCI-S)  02/27/2014  Procedure: PERCUTANEOUS CORONARY STENT INTERVENTION (PCI-S);  Surgeon: Laverda Page, MD;  Location: [facility] C[facility] LAB;  Service: Cardiovascular;;  rt PDA  3.0/28mm Promus stent . RADIAL ARTERY HARVEST Left 08/15/2019  Procedure: Radial Artery Harvest;  Surgeon: [name], MD;  Location: [place];  Service: Open Heart Surgery;  Laterality: Left; . RIB PLATING N/A 09/06/2019   Procedure: STERNAL PLATING;  Surgeon: Grace Javoni, MD;  Location: [place];  Service: Thoracic;  Laterality: N/A; . TEE WITHOUT CARDIOVERSION N/A 08/15/2019  Procedure: TRANSESOPHAGEAL ECHOCARDIOGRAM (TEE);  Surgeon: [name], MD;  Location: [place];  Service: Open Heart Surgery;  Laterality: N/A; . TRACHEOSTOMY TUBE PLACEMENT  08/29/2019  Procedure: Tracheostomy;  Surgeon: [name], MD;  Location: [facility] [facility];  Service: Open Heart Surgery;; . TRANSURETHRAL RESECTION OF PROSTATE  04/04/2012  Procedure: TRANSURETHRAL RESECTION OF THE PROSTATE WITH GYRUS INSTRUMENTS;  Surgeon: Ailene Rud, MD;  Location: [place];  Service: Urology;  Laterality: N/A; . TRANSURETHRAL RESECTION OF PROSTATE N/A 09/27/2014  Procedure: TRANSURETHRAL RESECTION OF THE PROSTATE ;  Surgeon: Carolan Clines, MD;  Location: [facility];  Service: Urology;  Laterality: N/A; . UPPER GASTROINTESTINAL ENDOSCOPY   HPI: 74 yo admitted with NSTEMI 4/15, RLL PE, 4/20 CABG x 4 remained open with wound VAC with repeated washouts. 4/23 cardiac arrest, 4/29 initiated CRRT, vent s/p trach. 5/10 additional sternal washout unable to close.  5/12 sternal plating with pectoralis flap and omental harvest to mediastinum off CRRT with iHD started. Chest wound closed. Neurology consulted for left sided weakness- CTH neg 09/08/19. 5/20 pt with emergent hematoma evacuation and transition to trach collar; 5/28 downsized to #4 cuffless; ultimately decannulated. PMHx: BPH, CAD, DM, HLD, HTN Had MBS on 09/30/19, weakness, poor visualization of airway, could not recommend thin liquids. FEES repeated to determine best diet textures. PEG placed on 10/04/19. Trach decannulated on 10/05/19.  Subjective: Awake, alert Assessment / Plan / Recommendation CHL IP CLINICAL IMPRESSIONS 10/13/2019 Clinical Impression   Pt presents with mild oropharyngeal dysphagia characterized by impaired bolus cohesion with premature spillage to the valleculae and a  pharyngeal delay. Pt's swallow was triggered with the head of the bolus at the level of the valleculae with thin liquids via cup and at the pyriform sinuses with thin liquids via straw. He demonstrated penetration (PAS 3) with consecutive swallows of thin liquids via straw secondary to the pharyngeal delay but no aspiration was noted. A regular texture diet with  thin liquids is recommended at this time with observance of swallowing precautions. SLP will follow pt briefly to ensure diet tolerance.  SLP Visit Diagnosis Dysphagia, oropharyngeal phase (R13.12) Attention and concentration deficit following -- Frontal lobe and executive function deficit following -- Impact on safety and function Mild aspiration risk   CHL IP TREATMENT RECOMMENDATION 10/13/2019 Treatment Recommendations Therapy as outlined in treatment plan below   Prognosis 10/13/2019 Prognosis for Safe Diet Advancement Good Barriers to Reach Goals -- Barriers/Prognosis Comment -- CHL IP DIET RECOMMENDATION 10/13/2019 SLP Diet Recommendations Regular solids;Thin liquid Liquid Administration via Cup;Straw Medication Administration Whole meds with liquid Compensations Slow rate;Small sips/bites Postural Changes --   CHL IP OTHER RECOMMENDATIONS 10/13/2019 Recommended Consults -- Oral Care Recommendations Oral care BID Other Recommendations --   CHL IP FOLLOW UP RECOMMENDATIONS 10/13/2019 Follow up Recommendations None   CHL IP FREQUENCY AND DURATION 10/13/2019 Speech Therapy Frequency (ACUTE ONLY) min 2x/week Treatment Duration 1 week      CHL IP ORAL PHASE 10/13/2019 Oral Phase Impaired Oral - Pudding Teaspoon -- Oral - Pudding Cup -- Oral - Honey Teaspoon -- Oral - Honey Cup -- Oral - Nectar Teaspoon NT Oral - Nectar Cup -- Oral - Nectar Straw NT Oral - Thin Teaspoon -- Oral - Thin Cup Premature spillage;Decreased bolus cohesion Oral - Thin Straw Premature spillage;Decreased bolus cohesion Oral - Puree WFL Oral - Mech Soft -- Oral - Regular -- Oral -  Multi-Consistency -- Oral - Pill -- Oral Phase - Comment --  CHL IP PHARYNGEAL PHASE 10/13/2019 Pharyngeal Phase Impaired Pharyngeal- Pudding Teaspoon -- Pharyngeal -- Pharyngeal- Pudding Cup -- Pharyngeal -- Pharyngeal- Honey Teaspoon -- Pharyngeal -- Pharyngeal- Honey Cup -- Pharyngeal -- Pharyngeal- Nectar Teaspoon NT Pharyngeal -- Pharyngeal- Nectar Cup -- Pharyngeal -- Pharyngeal- Nectar Straw -- Pharyngeal -- Pharyngeal- Thin Teaspoon NT Pharyngeal -- Pharyngeal- Thin Cup Delayed swallow initiation-vallecula Pharyngeal -- Pharyngeal- Thin Straw Reduced epiglottic inversion;Penetration/Aspiration during swallow;Delayed swallow initiation-pyriform sinuses Pharyngeal Material enters airway, remains ABOVE vocal cords and not ejected out Pharyngeal- Puree WFL Pharyngeal Material does not enter airway Pharyngeal- Mechanical Soft -- Pharyngeal -- Pharyngeal- Regular WFL Pharyngeal -- Pharyngeal- Multi-consistency -- Pharyngeal -- Pharyngeal- Pill -- Pharyngeal -- Pharyngeal Comment --  CHL IP CERVICAL ESOPHAGEAL PHASE 10/13/2019 Cervical Esophageal Phase WFL Pudding Teaspoon -- Pudding Cup -- Honey Teaspoon -- Honey Cup -- Nectar Teaspoon -- Nectar Cup -- Nectar Straw -- Thin Teaspoon -- Thin Cup -- Thin Straw -- Puree -- Mechanical Soft -- Regular -- Multi-consistency -- Pill -- Cervical Esophageal Comment -- Shanika I. Hardin Negus, [place], [place] Office number [phone] Pager [phone] Horton Marshall 10/13/2019, 11:42 AM                Medications:   . sodium chloride Stopped (09/14/19 0802)  . lactated ringers 20 mL/hr at 08/20/19 1200   .  stroke: mapping our early stages of recovery book   Does not apply Once  . sodium chloride   Intravenous Once  . amiodarone  200 mg Oral Daily  . arformoterol  15 mcg Nebulization BID  . aspirin  81 mg Oral Daily  . atorvastatin  40 mg Oral Daily  . bisacodyl  10 mg Rectal Daily  . chlorhexidine  15 mL Mouth Rinse BID  .  Chlorhexidine Gluconate Cloth  6 each Topical Q0600  . Chlorhexidine Gluconate Cloth  6 each Topical Q0600  . Chlorhexidine Gluconate Cloth  6 each Topical Q0600  . Chlorhexidine Gluconate Cloth  6 each Topical Q0600  . clonazePAM  0.5 mg Oral BID  . darbepoetin (ARANESP) injection - DIALYSIS  100 mcg Intravenous Q Tue-HD  . feeding supplement (NEPRO CARB STEADY)  720 mL Per Tube Q24H  . feeding supplement (PRO-STAT SUGAR FREE 64)  60 mL Per Tube BID  . insulin aspart  0-15 Units Subcutaneous Q4H  . insulin detemir  12 Units Subcutaneous BID  . mouth rinse  15 mL Mouth Rinse q12n4p  . multivitamin  1 tablet Oral QHS  . nystatin  5 mL Oral QID  . pantoprazole  40 mg Oral Daily  . revefenacin  175 mcg Nebulization Daily  . sertraline  25 mg Oral Daily  . sevelamer carbonate  1.6 g Oral TID WC  . trospium  20 mg Oral BID   acetaminophen, albuterol, dextrose, Gerhardt's butt cream, guaiFENesin, hydrALAZINE, HYDROcodone-acetaminophen, hydrocortisone cream, HYDROmorphone (DILAUDID) injection, HYDROmorphone (DILAUDID) injection, ondansetron (ZOFRAN) IV, ondansetron (ZOFRAN) IV, oxyCODONE, phenol, Resource ThickenUp Clear, sennosides, sodium chloride flush  Assessment/ Plan:   Acute kidney injury status post cardiac catheterization, CABG, cardiogenic shock and cardiac arrest.  Started CRRT 08/24/2019 until 09/06/2019 for volume control.  Now tolerating intermittent dialysis.  His last dialysis treatment was 10/14/2019 with ultrafiltration of 2 L   .  Continues to be anuric.     Appreciate assistance from Dr. Royce Macadamia medical director of [facility].  Patient appears to be tolerating reclining wheelchair for dialysis.  We shall finally make arrangements for outpatient placement.  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  Appears to be tolerating well with dialysis.  .  Chest x-ray does not show any significant pulmonary congestion 10/09/2019  Coronary artery disease status post CABG complicated by cardiac tamponade status post reopening chest urgently 08/18/2019 with wound VAC.  Most recently went back to the OR on 5/12 and 09/14/2019.  Continues on aspirin and atorvastatin  Atrial fibrillation per primary service  Last 2D echo showed systolic heart failure 2/77/4128 ejection fraction of about 40 to 45%.   LOS: [place] @TODAY @6 :48 AM

## 2019-10-16 LAB — RENAL FUNCTION PANEL
Albumin: 2.4 g/dL — ABNORMAL LOW (ref 3.5–5.0)
Anion gap: 13 (ref 5–15)
BUN: 65 mg/dL — ABNORMAL HIGH (ref 8–23)
CO2: 24 mmol/L (ref 22–32)
Calcium: 9.2 mg/dL (ref 8.9–10.3)
Chloride: 94 mmol/L — ABNORMAL LOW (ref 98–111)
Creatinine, Ser: 5.27 mg/dL — ABNORMAL HIGH (ref 0.61–1.24)
GFR calc Af Amer: 12 mL/min — ABNORMAL LOW (ref >60)
GFR calc non Af Amer: 10 mL/min — ABNORMAL LOW (ref >60)
Glucose, Bld: 84 mg/dL (ref 70–99)
Phosphorus: 5.7 mg/dL — ABNORMAL HIGH (ref 2.5–4.6)
Potassium: 3.9 mmol/L (ref 3.5–5.1)
Sodium: 131 mmol/L — ABNORMAL LOW (ref 135–145)

## 2019-10-16 LAB — GLUCOSE, CAPILLARY
Glucose-Capillary: 74 mg/dL (ref 70–99)
Glucose-Capillary: 77 mg/dL (ref 70–99)
Glucose-Capillary: 91 mg/dL (ref 70–99)
Glucose-Capillary: 122 mg/dL — ABNORMAL HIGH (ref 70–99)
Glucose-Capillary: 164 mg/dL — ABNORMAL HIGH (ref 70–99)

## 2019-10-16 LAB — MAGNESIUM: Magnesium: 2.3 mg/dL (ref 1.7–2.4)

## 2019-10-16 MED ORDER — ACETAMINOPHEN 325 MG PO TABS: 650.0000 mg | ORAL_TABLET | Freq: Four times a day (QID) | ORAL | Status: DC | PRN

## 2019-10-16 MED ORDER — SEVELAMER CARBONATE 0.8 G PO PACK: 1.6000 g | PACK | Freq: Three times a day (TID) | ORAL | Status: DC

## 2019-10-16 MED ORDER — AMIODARONE HCL 200 MG PO TABS: 200.0000 mg | ORAL_TABLET | Freq: Every day | ORAL | Status: DC

## 2019-10-16 MED ORDER — NEPRO/CARBSTEADY PO LIQD
480.0000 mL | ORAL | Status: DC
  Administered 2019-10-17: 474 mL
  Filled 2019-10-16: qty 711

## 2019-10-16 MED ORDER — OXYCODONE HCL 5 MG PO TABS: 5.0000 mg | ORAL_TABLET | Freq: Four times a day (QID) | ORAL | 0 refills | Status: DC | PRN

## 2019-10-16 MED ORDER — GERHARDT'S BUTT CREAM: 1.0000 "application " | TOPICAL_CREAM | Freq: Four times a day (QID) | CUTANEOUS | Status: DC | PRN

## 2019-10-16 MED ORDER — CLONAZEPAM 0.5 MG PO TBDP: 0.5000 mg | ORAL_TABLET | Freq: Two times a day (BID) | ORAL | 0 refills | Status: DC

## 2019-10-16 MED ORDER — RENA-VITE PO TABS: 1.0000 | ORAL_TABLET | Freq: Every day | ORAL | 0 refills | Status: DC

## 2019-10-16 MED ORDER — DARBEPOETIN ALFA 100 MCG/0.5ML IJ SOSY: 100.0000 ug | PREFILLED_SYRINGE | INTRAMUSCULAR | 0 refills | Status: AC

## 2019-10-16 MED ORDER — SERTRALINE HCL 25 MG PO TABS: 25.0000 mg | ORAL_TABLET | Freq: Every day | ORAL | Status: DC

## 2019-10-16 MED ORDER — PRO-STAT SUGAR FREE PO LIQD: 60.0000 mL | Freq: Two times a day (BID) | ORAL | 0 refills | Status: DC

## 2019-10-16 MED ORDER — NEPRO/CARBSTEADY PO LIQD: 720.0000 mL | ORAL | 0 refills | Status: DC

## 2019-10-16 NOTE — Plan of Care (Signed)
  Problem: Education: Goal: Knowledge of General Education information will improve Description: Including pain rating scale, medication(s)/side effects and non-pharmacologic comfort measures Outcome: Progressing   Problem: Health Behavior/Discharge Planning: Goal: Ability to manage health-related needs will improve Outcome: Progressing   Problem: Clinical Measurements: Goal: Ability to maintain clinical measurements within normal limits will improve Outcome: Progressing Goal: Will remain free from infection Outcome: Progressing Goal: Diagnostic test results will improve Outcome: Progressing Goal: Respiratory complications will improve Outcome: Progressing Goal: Cardiovascular complication will be avoided Outcome: Progressing   Problem: Activity: Goal: Risk for activity intolerance will decrease Outcome: Progressing   Problem: Nutrition: Goal: Adequate nutrition will be maintained Outcome: Progressing   Problem: Coping: Goal: Level of anxiety will decrease Outcome: Progressing   Problem: Elimination: Goal: Will not experience complications related to bowel motility Outcome: Progressing Goal: Will not experience complications related to urinary retention Outcome: Progressing   Problem: Pain Managment: Goal: General experience of comfort will improve Outcome: Progressing   Problem: Safety: Goal: Ability to remain free from injury will improve Outcome: Progressing   Problem: Skin Integrity: Goal: Risk for impaired skin integrity will decrease Outcome: Progressing   Problem: Education: Goal: Will demonstrate proper wound care and an understanding of methods to prevent future damage Outcome: Progressing Goal: Knowledge of disease or condition will improve Outcome: Progressing Goal: Knowledge of the prescribed therapeutic regimen will improve Outcome: Progressing Goal: Individualized Educational Video(s) Outcome: Progressing   Problem: Activity: Goal: Risk for  activity intolerance will decrease Outcome: Progressing   Problem: Cardiac: Goal: Will achieve and/or maintain hemodynamic stability Outcome: Progressing   Problem: Clinical Measurements: Goal: Postoperative complications will be avoided or minimized Outcome: Progressing   Problem: Respiratory: Goal: Respiratory status will improve Outcome: Progressing   Problem: Skin Integrity: Goal: Wound healing without signs and symptoms of infection Outcome: Progressing Goal: Risk for impaired skin integrity will decrease Outcome: Progressing   Problem: Urinary Elimination: Goal: Ability to achieve and maintain adequate renal perfusion and functioning will improve Outcome: Progressing   

## 2019-10-16 NOTE — Progress Notes (Signed)
  Speech Language Pathology Treatment: Dysphagia  Patient Details Name: Christian [name] MRN: [id number] DOB: 1945/12/26 Today's Date: 10/16/2019 Time: 9122-5834 SLP Time Calculation (min) (ACUTE ONLY): 15 min  Assessment / Plan / Recommendation Clinical Impression  Pt was seen for dysphagia treatment and was cooperative throughout the session. Pt and nursing reported that he has been tolerating the current diet without overt s/sx of aspiration. Pt tolerated regular texture solids, and thin liquids via cup and straw without symptoms of oropharyngeal dysphagia. He independently used compensatory strategies. All of the pt's questions regarding swallowing were answered to his satisfaction. It is recommended that the current diet be continued. Further skilled SLP services are not clinically indicated at this time.    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 4/23 cardiac arrest, 4/29 initiated CRRT, vent s/p trach. 5/10 additional sternal washout unable to close.  5/12 sternal plating with pectoralis flap and omental harvest to mediastinum off CRRT with iHD started. Chest wound closed. Neurology consulted for left sided weakness- CTH neg 09/08/19. 5/20 pt with emergent hematoma evacuation and transition to trach collar; 5/28 downsized to #4 cuffless; ultimately decannulated. PMHx: BPH, CAD, DM, HLD, HTN Had MBS on 09/30/19, weakness, poor visualization of airway, could not recommend thin liquids. FEES repeated to determine best diet textures. PEG placed on 10/04/19. Trach decannulated on 10/05/19.      SLP Plan  All goals met;Discharge SLP treatment due to (comment)       Recommendations  Diet recommendations: Regular;Thin liquid Liquids provided via: Cup;Straw Medication Administration: Whole meds with puree Supervision: Intermittent supervision to cue for compensatory strategies Compensations: Slow rate;Small  sips/bites Postural Changes and/or Swallow Maneuvers: Upright 30-60 min after meal;Seated upright 90 degrees                Oral Care Recommendations: Oral care BID Follow up Recommendations: 24 hour supervision/assistance SLP Visit Diagnosis: Dysphagia, oropharyngeal phase (R13.12) Plan: All goals met;Discharge SLP treatment due to (comment)       Solae Norling I. Hardin Negus, [place], [place] Office number [phone] Pager [place] 10/16/2019, 9:51 AM

## 2019-10-16 NOTE — Progress Notes (Signed)
Renal Navigator left message for patient's wife requesting a return call in order to update her regarding patient's OP HD information.  Alphonzo Cruise, [place] Renal Navigator [phone]

## 2019-10-16 NOTE — Progress Notes (Signed)
Sutures removed per order  

## 2019-10-16 NOTE — Progress Notes (Signed)
Nutrition Follow-up  RD working remotely.  DOCUMENTATION CODES:   Not applicable  INTERVENTION:   Continue nocturnal tube feeds via G-tube: - Decrease rate of Nepro to 40 ml/hr x 12 hours from 1800 to 0600 (480 ml total) - Continue Pro-stat 60 ml BID  Nocturnal tube feeding regimen with Pro-stat provides 1264 kcal, 99 grams of protein, and 349 ml of H2O (59% of kcal needs, 58% of protein needs).  - Continue renal MVI daily per tube  - Continue Vital Cuisine Shake TID, each supplement provides 520 kcal and 22 grams of protein  - Encourage adequate PO intake  - Agree with liberalized Regular diet order  NUTRITION DIAGNOSIS:   Increased nutrient needs related to post-op healing as evidenced by estimated needs.  Ongoing, being addressed via TF and supplements  GOAL:   Patient will meet greater than or equal to 90% of their needs  Progressing  MONITOR:   PO intake, Supplement acceptance, Diet advancement, Labs, Weight trends, TF tolerance, Skin, I & O's  REASON FOR ASSESSMENT:   Consult Enteral/tube feeding initiation and management  ASSESSMENT:   Patient with PMH significant for CAD s/p stenting, HTN, HLD, DM, and BPH. Presents this admission with RLL PE and for CABG.  4/14 - admitted  4/20 - CABG x 4, chest left open 4/22 - chest closure, application of incisional woundVAC 4/23 - extubated, cardiac arrest, chest re-opened for washout, clot removed, re-intubated 4/26 - return to OR for washout, cortrak placed, TF initiated 4/29 - chest washout, application of wound VAC, CRRT initiated 5/05 - chest washout, tracheostomy 5/10 - chest washout, wound VAC 5/12 - omental harvest, R pectoral muscle advancement flap, layered closure of sternal wound, wound VAC, stop CRRT 5/13 - first HD 5/20 - mediastinal exploration, evacuation of hematoma, wound VAC 5/24 - trach downsized6 cuffless 5/25 - L tunneled HD cath 5/28 - trach downsized 4 cuffless 6/07 - diet  advanced to dysphagia 1, nectar-thick liquids 6/09 - s/p G-tube placement by IR 6/10 - decannulated 6/11 - NPO due to vomiting, back on dysphagia 1, nectar-thick liquids in the afternoon 6/14 - diet advanced to dysphagia 3, nectar-thick liquids 6/18 - MBS, diet advanced to regular, thin liquids  Last HD on 10/14/19 with 2.1 L net UF. Plan for next HD on 10/17/19.  Per MAR, pt refused nocturnal tube feeds over the last 2 nights. Given improved PO intake, RD will decrease rate of tube feeds. Recommend continuing nocturnal tube feeds at this time given pt's increased kcal and protein needs. Pt is unlikely to be able to meet kcal and protein needs via PO intake alone.  Meal Completion: 15-75% x last 8 recorded meals  Medications reviewed and include: dulcolax, Aranesp, SSI q 4 hours, Levemir 12 units BID, rena-vit, protonix, Renvela TID  Labs reviewed: sodium 131 CBG's: 73-191 x 24 hours  Diet Order:   Diet Order            Diet regular Room service appropriate? Yes with Assist; Fluid consistency: Thin  Diet effective now                 EDUCATION NEEDS:   Not appropriate for education at this time  Skin:  Skin Assessment: Skin Integrity Issues: Stage II: right buttocks Incisions: chest, abdomen, neck, left arm  Last BM:  10/15/19  Height:   Ht Readings from Last 1 Encounters:  08/28/19 5\' 9"  (1.753 m)    Weight:   Wt Readings from Last 1 Encounters:  10/16/19 86.7 kg    BMI:  Body mass index is 28.23 kg/m.  Estimated Nutritional Needs:   Kcal:  2150-2550  Protein:  170-195 grams  Fluid:  >/= 2 L/day    Christian Face, MS, RD, LDN Inpatient Clinical Dietitian Pager: [phone] Weekend/After Hours: [phone]

## 2019-10-16 NOTE — Discharge Instructions (Signed)
1. Please continue wet to dry dressings to left arm incision  1. Please obtain vital signs at least one time daily 2.Please weigh the patient daily. If he or she continues to gain weight or develops lower extremity edema, contact the office at [phone]. 3. Ambulate patient at least three times daily and please use sternal precautions.   Discharge Instructions:  1. You may shower, please wash incisions daily with soap and water and keep dry.  If you wish to cover wounds with dressing you may do so but please keep clean and change daily.  No tub baths or swimming until incisions have completely healed.  If your incisions become red or develop any drainage please call our office at [phone]  2. No Driving until cleared by our office and you are no longer using narcotic pain medications  3. Monitor your weight daily.. Please use the same scale and weigh at same time... If you gain 3-5 lbs in 48 hours with associated lower extremity swelling, please contact our office at [phone]  4. Fever of 101.5 for at least 24 hours with no source, please contact our office at [phone]  5. Activity- up as tolerated, please walk at least 3 times per day.  Avoid strenuous activity, no lifting, pushing, or pulling with your arms over 8-10 lbs for a minimum of 6 weeks  6. If any questions or concerns arise, please do not hesitate to contact our office at [phone]

## 2019-10-16 NOTE — Progress Notes (Signed)
      [address]       [place] [address]             [phone]      32 Days Post-Op Procedure(s) (LRB): EVACUATION OF HEMATOMA (N/A)   Subjective:  Patient with pain at J Tube site.  Otherwise without complaints.   Objective: Vital signs in last 24 hours: Temp:  [97.6 F (36.4 C)-98.8 F (37.1 C)] 97.6 F (36.4 C) (06/21 0731) Pulse Rate:  [101-105] 104 (06/21 0835) Cardiac Rhythm: Sinus tachycardia (06/21 0712) Resp:  [15-20] 18 (06/21 0835) BP: (108-139)/(58-77) 139/77 (06/21 0731) SpO2:  [96 %-100 %] 100 % (06/21 0731) Weight:  [86.7 kg] 86.7 kg (06/21 0500)  Intake/Output from previous day: 06/20 0701 - 06/21 0700 In: 750 [P.O.:720; I.V.:30] Out: 0   General appearance: alert, cooperative and no distress Heart: regular rate and rhythm Lungs: clear to auscultation bilaterally Abdomen: soft, non-tender; bowel sounds normal; no masses,  no organomegaly Extremities: extremities normal, atraumatic, no cyanosis or edema Wound: clean and dry radial site, sternotomy C/D/I, J tube site clean  Lab Results: Recent Labs    10/14/19 0727  WBC 14.2*  HGB 9.2*  HCT 30.2*  PLT 377   BMET:  Recent Labs    10/15/19 1433 10/16/19 0232  NA 131* 131*  K 3.2* 3.9  CL 96* 94*  CO2 23 24  GLUCOSE 123* 84  BUN 58* 65*  CREATININE 4.63* 5.27*  CALCIUM 9.4 9.2    PT/INR: No results for input(s): LABPROT, INR in the last 72 hours. ABG    Component Value Date/Time   PHART 7.481 (H) 09/07/2019 0029   HCO3 23.3 09/07/2019 0029   TCO2 24 09/07/2019 0029   ACIDBASEDEF 5.0 (H) 08/18/2019 2341   O2SAT 99.0 09/07/2019 0029   CBG (last 3)  Recent Labs    10/15/19 2315 10/16/19 0400 10/16/19 0810  GLUCAP 73 74 77    Assessment/Plan: S/P Procedure(s) (LRB): EVACUATION OF HEMATOMA (N/A)  1. CV- hemodynamically stable 2. Pulm- no acute issues, off oxygen 3. Renal- ESRD, per Nephrology on dialysis T/TH/Sun 4. Nutrition- advancing diet as able  per SLP 5. Wound- d/c sutures, continue wet to dry dressings on radial artery site 6. dispo- patient stable, for SNF once bed is available   LOS: 68 days    [name], PA-C  10/16/2019

## 2019-10-16 NOTE — Progress Notes (Signed)
[facility] Progress Note   Assessment/ Plan:   1.  Acute kidney injury status post cardiac catheterization, CABG, cardiogenic shock and cardiac arrest.  Started CRRT 08/24/2019 until 09/06/2019 for volume control.  Now tolerating intermittent dialysis.  His last dialysis treatment was 10/14/2019 with ultrafiltration of 2 L.  Continues to be anuric and I expect him to have a low likelihood of renal recovery.  TTS schedule while here in recliner.  Accepted to [facility] on dc/  2.  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  MBD-continues Renvela 4.  HTN/VOL-continue ultrafiltration for volume control.  Appears to be tolerating well with dialysis.   5.  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  Most recently went back to the OR on 5/12 and 09/14/2019.  Continues on aspirin and atorvastatin 6. Atrial fibrillation per primary service 7.  Dispo: pending  Subjective:    No complaints.  Asking for bedpan- have given with assistance of NT   Objective:   BP 139/77 (BP Location: Left Wrist)   Pulse (!) 104   Temp 97.6 F (36.4 C) (Oral)   Resp 18   Ht 5\' 9"  (1.753 m)   Wt 86.7 kg   SpO2 100%   BMI 28.23 kg/m   Physical Exam: Gen: lying in bed, sig weak CVS: tachy Resp: clear today Abd: soft, PEG in place with some tenderness, slight dried blood around button, lower abd dressings intact Ext: 1+ LE edema ACCESS: R IJ TDC  Labs: BMET Recent Labs  Lab 10/10/19 0500 10/11/19 0310 10/12/19 0248 10/13/19 0227 10/14/19 0237 10/15/19 1433 10/16/19 0232  NA 126* 134* 131* 134* 129* 131* 131*  K 4.4 3.8 3.9 3.7 4.0 3.2* 3.9  CL 87* 95* 93* 96* 92* 96* 94*  CO2 23 22 24 26 24 23 24   GLUCOSE 143* 200* 161* 141* 153* 123* 84  BUN 128* 59* 95* 50* 85* 58* 65*  CREATININE 6.91* 4.25* 5.66* 3.77* 5.48* 4.63* 5.27*  CALCIUM 9.3 9.2 9.4 9.4 9.2 9.4 9.2   PHOS 6.7* 5.3* 5.4* 3.8 4.7* 4.8* 5.7*   CBC Recent Labs  Lab 10/10/19 0500 10/12/19 0739 10/14/19 0727  WBC 16.2* 14.6* 14.2*  HGB 9.4* 9.3* 9.2*  HCT 29.4* 30.3* 30.2*  MCV 98.7 101.0* 101.3*  PLT 406* 369 377      Medications:    .  stroke: mapping our early stages of recovery book   Does not apply Once  . sodium chloride   Intravenous Once  . amiodarone  200 mg Oral Daily  . arformoterol  15 mcg Nebulization BID  . aspirin  81 mg Oral Daily  . atorvastatin  40 mg Oral Daily  . bisacodyl  10 mg Rectal Daily  . chlorhexidine  15 mL Mouth Rinse BID  . Chlorhexidine Gluconate Cloth  6 each Topical Q0600  . clonazePAM  0.5 mg Oral BID  . darbepoetin (ARANESP) injection - DIALYSIS  100 mcg Intravenous Q Tue-HD  . feeding supplement (NEPRO CARB STEADY)  720 mL Per Tube Q24H  . feeding supplement (PRO-STAT SUGAR FREE 64)  60 mL Per Tube BID  . insulin aspart  0-15 Units Subcutaneous Q4H  . insulin detemir  12 Units Subcutaneous BID  . mouth rinse  15 mL Mouth Rinse q12n4p  . multivitamin  1 tablet Oral QHS  . nystatin  5 mL Oral QID  . pantoprazole  40 mg  Oral Daily  . revefenacin  175 mcg Nebulization Daily  . sertraline  25 mg Oral Daily  . sevelamer carbonate  1.6 g Oral TID WC  . trospium  20 mg Oral BID     Madelon Lips, MD 10/16/2019, 10:20 AM

## 2019-10-16 NOTE — Plan of Care (Signed)

## 2019-10-17 DIAGNOSIS — N2581 Secondary hyperparathyroidism of renal origin: Secondary | ICD-10-CM | POA: Insufficient documentation

## 2019-10-17 DIAGNOSIS — T829XXA Unspecified complication of cardiac and vascular prosthetic device, implant and graft, initial encounter: Secondary | ICD-10-CM | POA: Insufficient documentation

## 2019-10-17 DIAGNOSIS — E1140 Type 2 diabetes mellitus with diabetic neuropathy, unspecified: Secondary | ICD-10-CM | POA: Insufficient documentation

## 2019-10-17 DIAGNOSIS — T782XXA Anaphylactic shock, unspecified, initial encounter: Secondary | ICD-10-CM | POA: Insufficient documentation

## 2019-10-17 DIAGNOSIS — N178 Other acute kidney failure: Secondary | ICD-10-CM | POA: Insufficient documentation

## 2019-10-17 DIAGNOSIS — D509 Iron deficiency anemia, unspecified: Secondary | ICD-10-CM | POA: Insufficient documentation

## 2019-10-17 DIAGNOSIS — T7840XA Allergy, unspecified, initial encounter: Secondary | ICD-10-CM | POA: Insufficient documentation

## 2019-10-17 DIAGNOSIS — D688 Other specified coagulation defects: Secondary | ICD-10-CM | POA: Insufficient documentation

## 2019-10-17 LAB — GLUCOSE, CAPILLARY
Glucose-Capillary: 109 mg/dL — ABNORMAL HIGH (ref 70–99)
Glucose-Capillary: 148 mg/dL — ABNORMAL HIGH (ref 70–99)
Glucose-Capillary: 153 mg/dL — ABNORMAL HIGH (ref 70–99)
Glucose-Capillary: 166 mg/dL — ABNORMAL HIGH (ref 70–99)
Glucose-Capillary: 181 mg/dL — ABNORMAL HIGH (ref 70–99)

## 2019-10-17 LAB — CBC
HCT: 29.1 % — ABNORMAL LOW (ref 39.0–52.0)
Hemoglobin: 9.2 g/dL — ABNORMAL LOW (ref 13.0–17.0)
MCH: 31.1 pg (ref 26.0–34.0)
MCHC: 31.6 g/dL (ref 30.0–36.0)
MCV: 98.3 fL (ref 80.0–100.0)
Platelets: 346 10*3/uL (ref 150–400)
RBC: 2.96 MIL/uL — ABNORMAL LOW (ref 4.22–5.81)
RDW: 18.2 % — ABNORMAL HIGH (ref 11.5–15.5)
WBC: 12.3 10*3/uL — ABNORMAL HIGH (ref 4.0–10.5)
nRBC: 0 % (ref 0.0–0.2)

## 2019-10-17 LAB — RENAL FUNCTION PANEL
Albumin: 2.4 g/dL — ABNORMAL LOW (ref 3.5–5.0)
Anion gap: 15 (ref 5–15)
BUN: 83 mg/dL — ABNORMAL HIGH (ref 8–23)
CO2: 21 mmol/L — ABNORMAL LOW (ref 22–32)
Calcium: 9 mg/dL (ref 8.9–10.3)
Chloride: 95 mmol/L — ABNORMAL LOW (ref 98–111)
Creatinine, Ser: 6.48 mg/dL — ABNORMAL HIGH (ref 0.61–1.24)
GFR calc Af Amer: 9 mL/min — ABNORMAL LOW (ref >60)
GFR calc non Af Amer: 8 mL/min — ABNORMAL LOW (ref >60)
Glucose, Bld: 187 mg/dL — ABNORMAL HIGH (ref 70–99)
Phosphorus: 6.7 mg/dL — ABNORMAL HIGH (ref 2.5–4.6)
Potassium: 3.9 mmol/L (ref 3.5–5.1)
Sodium: 131 mmol/L — ABNORMAL LOW (ref 135–145)

## 2019-10-17 LAB — MAGNESIUM: Magnesium: 2.5 mg/dL — ABNORMAL HIGH (ref 1.7–2.4)

## 2019-10-17 LAB — SARS CORONAVIRUS 2 (TAT 6-24 HRS): SARS Coronavirus 2: NEGATIVE

## 2019-10-17 MED ORDER — HYDROCODONE-ACETAMINOPHEN 5-325 MG PO TABS
ORAL_TABLET | ORAL | Status: AC
  Filled 2019-10-17: qty 2

## 2019-10-17 MED ORDER — HEPARIN SODIUM (PORCINE) 1000 UNIT/ML IJ SOLN
INTRAMUSCULAR | Status: AC
  Administered 2019-10-17: 4200 [IU]
  Filled 2019-10-17: qty 4

## 2019-10-17 MED ORDER — DARBEPOETIN ALFA 100 MCG/0.5ML IJ SOSY
PREFILLED_SYRINGE | INTRAMUSCULAR | Status: AC
  Administered 2019-10-17: 100 ug via INTRAVENOUS
  Filled 2019-10-17: qty 0.5

## 2019-10-17 NOTE — Progress Notes (Signed)
Renal Navigator, along with CSW/N. Rayyan, met with patient and his wife/Barbara at bedside to discuss discharge planning. Navigator explained, verbally and in writing, patient's OP HD seat time at [facility] and that patient will be transported by [facility] in a wheelchair. Pamala Hurry has questions regarding whether or not she can ride with him, and or about [place] ([facility]) services, to which Navigator strongly encourages her to call [facility] directly to discuss. Phone number provided. Patient's discharge to [facility] is planned for tomorrow (COVID test pending).  With discharge to SNF tomorrow, Wednesday, 10/18/19, patient will start at the OP HD clinic on Thursday, 10/19/19. Navigator asked that wife meet patient at the clinic on his first day at 11:15am to assist him in completing intake paperwork.  Navigator has arranged transportation through [employer] for patient's first OP HD, Thursday 10/19/19 with a pick up at [facility] between 9:45am-10:15am and pick up at [facility] to return to [facility] between 4:45pm-5:15pm. Since patient's first OP HD is earlier than future appointments, ONLY HIS FIRST DAY [place]. All future OP HD transportation appointments need to be scheduled in order for patient to arrive at [facility] at 11:45am. Renal Navigator updated Renal PA to request OP HD orders be sent to clinic on day of discharge. [facility] Admissions Coordinator updated and OP HD clinic/manager updated. Navigator will continue to follow closely.  Alphonzo Cruise, [place] Renal Navigator [phone]

## 2019-10-17 NOTE — Progress Notes (Signed)
PT Cancellation Note  Patient Details Name: Christian [name] MRN: [id number] DOB: 31-May-1945   Cancelled Treatment:    Reason Eval/Treat Not Completed: Patient at procedure or test/unavailable. Pt at HD.    Shary Decamp [facility] 10/17/2019, 12:28 PM Franco Duley [place] Pager [phone] Office [phone]

## 2019-10-17 NOTE — TOC Progression Note (Addendum)
Transition of Care [facility]) - Progression Note    Patient Details  Name: Christian [name] MRN: [id number] Date of Birth: 1946-03-01  Transition of Care [facility]) CM/SW [place], LCSW Phone Number: 10/17/2019, 10:22 AM  Clinical Narrative:    10:22am-CSW contacted insurance to check on status of authorization. Representative reported that they would likely deny stay since no new procedures since the last denial in May. CSW went over dates of procedure with her, including PEG placement on 6/9. She reported they were unaware of PEG and stated that will get patient approved for rehab. Approval should come through later today. CSW discussed case with Renal Navigator who reports it will be optimal for patient to discharge tomorrow on a non-dialysis day. CSW spoke with patient's wife to make her aware of plan. She requested to speak with CSW in person once she arrives to the hospital. CSW requested COVID test for patient.   1pm-CSW and Renal Navigator spoke with patien and wife at bedside to answer questions about patient's discharge plan tomorrow.    Expected Discharge Plan: [place] Barriers to Discharge: Inadequate or no insurance, Other (comment)  Expected Discharge Plan and Services Expected Discharge Plan: [place] arrangements for the past 2 months: Single Family Home Expected Discharge Date: 10/16/19                                     Social Determinants of Health (SDOH) Interventions    Readmission Risk Interventions No flowsheet data found.

## 2019-10-17 NOTE — Progress Notes (Signed)
[facility] Progress Note   Assessment/ Plan:   1.  Acute kidney injury status post cardiac catheterization, CABG, cardiogenic shock and cardiac arrest.  Started CRRT 08/24/2019 until 09/06/2019 for volume control.  Now tolerating intermittent dialysis. Continues to be anuric and I expect him to have a low likelihood of renal recovery.  TTS schedule while here in recliner.  Accepted to [facility] on d/c.  Has a TDC--> will need permanent access as OP.  2.  PEG site pain: abdomen is soft- would recommend imaging at discretion of primary team to make sure it's in the right place given his discomfort.  3.  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  MBD-continues Renvela  5.  HTN/VOL-continue ultrafiltration for volume control.  Appears to be tolerating well with dialysis.   6.  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  Most recently went back to the OR on 5/12 and 09/14/2019.  Continues on aspirin and atorvastatin  7. Atrial fibrillation per primary service  8.  Dispo: SNF when bed available  Subjective:    Seen on HD.  Continues to complain of pain at PEG site.     Objective:   BP 111/73 (BP Location: Right Arm)   Pulse (!) 102   Temp 98 F (36.7 C) (Oral)   Resp 14   Ht 5\' 9"  (1.753 m)   Wt 84.1 kg   SpO2 100%   BMI 27.38 kg/m   Physical Exam: Gen: lying in bed, appears uncomfortable CVS: tachy Resp: clear today Abd: soft, PEG in place with some tenderness, slight dried blood around button, lower abd dressings intact Ext: 1+ LE edema ACCESS: R IJ TDC  Labs: BMET Recent Labs  Lab 10/11/19 0310 10/12/19 0248 10/13/19 0227 10/14/19 0237 10/15/19 1433 10/16/19 0232 10/17/19 0254  NA 134* 131* 134* 129* 131* 131* 131*  K 3.8 3.9 3.7 4.0 3.2* 3.9 3.9  CL 95* 93* 96* 92* 96* 94* 95*  CO2 22 24 26 24 23 24  21*  GLUCOSE 200* 161* 141* 153* 123* 84 187*  BUN  59* 95* 50* 85* 58* 65* 83*  CREATININE 4.25* 5.66* 3.77* 5.48* 4.63* 5.27* 6.48*  CALCIUM 9.2 9.4 9.4 9.2 9.4 9.2 9.0  PHOS 5.3* 5.4* 3.8 4.7* 4.8* 5.7* 6.7*   CBC Recent Labs  Lab 10/12/19 0739 10/14/19 0727 10/17/19 0723  WBC 14.6* 14.2* 12.3*  HGB 9.3* 9.2* 9.2*  HCT 30.3* 30.2* 29.1*  MCV 101.0* 101.3* 98.3  PLT 369 377 346      Medications:    .  stroke: mapping our early stages of recovery book   Does not apply Once  . sodium chloride   Intravenous Once  . amiodarone  200 mg Oral Daily  . arformoterol  15 mcg Nebulization BID  . aspirin  81 mg Oral Daily  . atorvastatin  40 mg Oral Daily  . bisacodyl  10 mg Rectal Daily  . chlorhexidine  15 mL Mouth Rinse BID  . Chlorhexidine Gluconate Cloth  6 each Topical Q0600  . clonazePAM  0.5 mg Oral BID  . darbepoetin (ARANESP) injection - DIALYSIS  100 mcg Intravenous Q Tue-HD  . feeding supplement (NEPRO CARB STEADY)  480 mL Per Tube Q24H  . feeding supplement (PRO-STAT SUGAR FREE 64)  60 mL Per Tube BID  . insulin aspart  0-15 Units Subcutaneous Q4H  . insulin detemir  12 Units Subcutaneous BID  .  mouth rinse  15 mL Mouth Rinse q12n4p  . multivitamin  1 tablet Oral QHS  . nystatin  5 mL Oral QID  . pantoprazole  40 mg Oral Daily  . revefenacin  175 mcg Nebulization Daily  . sertraline  25 mg Oral Daily  . sevelamer carbonate  1.6 g Oral TID WC  . trospium  20 mg Oral BID     Madelon Lips, MD 10/17/2019, 10:00 AM

## 2019-10-17 NOTE — Progress Notes (Signed)
PT Cancellation Note  Patient Details Name: Christian [name] MRN: [id number] DOB: 1945/06/07   Cancelled Treatment:    Reason Eval/Treat Not Completed: Patient at procedure or test/unavailable   Shary Decamp [facility] 10/17/2019, 9:55 AM [place] Pager [phone] Office [phone]

## 2019-10-17 NOTE — Procedures (Signed)
Patient seen and examined on Hemodialysis. BP 111/73 (BP Location: Right Arm)   Pulse (!) 102   Temp 98 F (36.7 C) (Oral)   Resp 14   Ht 5\' 9"  (1.753 m)   Wt 84.1 kg   SpO2 100%   BMI 27.38 kg/m   QB 400 mL/ min via TDC UF goal 2L   Tolerating treatment without complaints at this time.   Madelon Lips MD [facility] pgr [phone] 10:06 AM

## 2019-10-17 NOTE — Plan of Care (Signed)
  Problem: Education: Goal: Knowledge of General Education information will improve Description: Including pain rating scale, medication(s)/side effects and non-pharmacologic comfort measures Outcome: Progressing   Problem: Health Behavior/Discharge Planning: Goal: Ability to manage health-related needs will improve Outcome: Progressing   Problem: Clinical Measurements: Goal: Ability to maintain clinical measurements within normal limits will improve Outcome: Progressing Goal: Will remain free from infection Outcome: Progressing Goal: Diagnostic test results will improve Outcome: Progressing Goal: Respiratory complications will improve Outcome: Progressing Goal: Cardiovascular complication will be avoided Outcome: Progressing   Problem: Activity: Goal: Risk for activity intolerance will decrease Outcome: Progressing   Problem: Nutrition: Goal: Adequate nutrition will be maintained Outcome: Progressing   Problem: Coping: Goal: Level of anxiety will decrease Outcome: Progressing   Problem: Elimination: Goal: Will not experience complications related to bowel motility Outcome: Progressing Goal: Will not experience complications related to urinary retention Outcome: Progressing   Problem: Pain Managment: Goal: General experience of comfort will improve Outcome: Progressing   Problem: Safety: Goal: Ability to remain free from injury will improve Outcome: Progressing   Problem: Skin Integrity: Goal: Risk for impaired skin integrity will decrease Outcome: Progressing   Problem: Activity: Goal: Risk for activity intolerance will decrease Outcome: Progressing   Problem: Cardiac: Goal: Will achieve and/or maintain hemodynamic stability Outcome: Progressing   Problem: Clinical Measurements: Goal: Postoperative complications will be avoided or minimized Outcome: Progressing   Problem: Respiratory: Goal: Respiratory status will improve Outcome: Progressing    Problem: Skin Integrity: Goal: Wound healing without signs and symptoms of infection Outcome: Progressing Goal: Risk for impaired skin integrity will decrease Outcome: Progressing

## 2019-10-17 NOTE — Progress Notes (Signed)
      [address]       [place] [address]             [phone]       33 Days Post-Op Procedure(s) (LRB): EVACUATION OF HEMATOMA (N/A)   Subjective:  No new complaints.    Objective: Vital signs in last 24 hours: Temp:  [97.7 F (36.5 C)-98.7 F (37.1 C)] 98 F (36.7 C) (06/22 0739) Pulse Rate:  [96-104] 100 (06/22 0830) Cardiac Rhythm: Normal sinus rhythm (06/22 0716) Resp:  [14-20] 14 (06/22 0739) BP: (99-155)/(46-86) 101/63 (06/22 0830) SpO2:  [100 %] 100 % (06/22 0739) Weight:  [84.1 kg-87.4 kg] 84.1 kg (06/22 0739)  General appearance: alert, cooperative and no distress Heart: regular rate and rhythm Lungs: clear to auscultation bilaterally Abdomen: soft, non-tender; bowel sounds normal; no masses,  no organomegaly Extremities: edema trace Wound: clean and dry  Lab Results: Recent Labs    10/17/19 0723  WBC 12.3*  HGB 9.2*  HCT 29.1*  PLT 346   BMET:  Recent Labs    10/16/19 0232 10/17/19 0254  NA 131* 131*  K 3.9 3.9  CL 94* 95*  CO2 24 21*  GLUCOSE 84 187*  BUN 65* 83*  CREATININE 5.27* 6.48*  CALCIUM 9.2 9.0    PT/INR: No results for input(s): LABPROT, INR in the last 72 hours. ABG    Component Value Date/Time   PHART 7.481 (H) 09/07/2019 0029   HCO3 23.3 09/07/2019 0029   TCO2 24 09/07/2019 0029   ACIDBASEDEF 5.0 (H) 08/18/2019 2341   O2SAT 99.0 09/07/2019 0029   CBG (last 3)  Recent Labs    10/16/19 2017 10/16/19 2352 10/17/19 0419  GLUCAP 164* 153* 181*    Assessment/Plan: S/P Procedure(s) (LRB): EVACUATION OF HEMATOMA (N/A)  1. CV- hemodynamically stable 2. Pulm- no acute issues 3. Renal- ESRD, dialysis today, nephrology following 4. Nutrition- diet per SLP 5. Deconditioning- patient waiting for SNF bed 6. Dispo- patient stable, ready for d/c once SNF bed is available   LOS: 69 days   [name], PA-C  10/17/2019

## 2019-10-17 NOTE — Plan of Care (Signed)

## 2019-10-17 NOTE — Progress Notes (Signed)
NCM received call from [employer]/ [place] with SNF authorization,  Auth. ID: [id number], reference # [id number]. Approved  X 3 days, 6/22- 6/24. Plan: [facility] / SNF. Arist Dagout CM. Whitman Hero RN,BSN,CM

## 2019-10-18 ENCOUNTER — Telehealth: Payer: Self-pay | Admitting: *Deleted

## 2019-10-18 DIAGNOSIS — M6281 Muscle weakness (generalized): Secondary | ICD-10-CM | POA: Diagnosis not present

## 2019-10-18 DIAGNOSIS — J90 Pleural effusion, not elsewhere classified: Secondary | ICD-10-CM | POA: Diagnosis not present

## 2019-10-18 DIAGNOSIS — N186 End stage renal disease: Secondary | ICD-10-CM | POA: Diagnosis not present

## 2019-10-18 DIAGNOSIS — E1140 Type 2 diabetes mellitus with diabetic neuropathy, unspecified: Secondary | ICD-10-CM | POA: Diagnosis not present

## 2019-10-18 DIAGNOSIS — I509 Heart failure, unspecified: Secondary | ICD-10-CM | POA: Diagnosis not present

## 2019-10-18 DIAGNOSIS — Z7401 Bed confinement status: Secondary | ICD-10-CM | POA: Diagnosis not present

## 2019-10-18 DIAGNOSIS — Z955 Presence of coronary angioplasty implant and graft: Secondary | ICD-10-CM | POA: Diagnosis not present

## 2019-10-18 DIAGNOSIS — E1142 Type 2 diabetes mellitus with diabetic polyneuropathy: Secondary | ICD-10-CM | POA: Diagnosis not present

## 2019-10-18 DIAGNOSIS — R112 Nausea with vomiting, unspecified: Secondary | ICD-10-CM | POA: Diagnosis not present

## 2019-10-18 DIAGNOSIS — E785 Hyperlipidemia, unspecified: Secondary | ICD-10-CM | POA: Diagnosis not present

## 2019-10-18 DIAGNOSIS — M2550 Pain in unspecified joint: Secondary | ICD-10-CM | POA: Diagnosis not present

## 2019-10-18 DIAGNOSIS — Z743 Need for continuous supervision: Secondary | ICD-10-CM | POA: Diagnosis not present

## 2019-10-18 DIAGNOSIS — D509 Iron deficiency anemia, unspecified: Secondary | ICD-10-CM | POA: Diagnosis not present

## 2019-10-18 DIAGNOSIS — T8130XA Disruption of wound, unspecified, initial encounter: Secondary | ICD-10-CM | POA: Diagnosis not present

## 2019-10-18 DIAGNOSIS — L299 Pruritus, unspecified: Secondary | ICD-10-CM | POA: Diagnosis not present

## 2019-10-18 DIAGNOSIS — L98492 Non-pressure chronic ulcer of skin of other sites with fat layer exposed: Secondary | ICD-10-CM | POA: Diagnosis not present

## 2019-10-18 DIAGNOSIS — N2581 Secondary hyperparathyroidism of renal origin: Secondary | ICD-10-CM | POA: Diagnosis not present

## 2019-10-18 DIAGNOSIS — Z431 Encounter for attention to gastrostomy: Secondary | ICD-10-CM | POA: Diagnosis not present

## 2019-10-18 DIAGNOSIS — H04123 Dry eye syndrome of bilateral lacrimal glands: Secondary | ICD-10-CM | POA: Diagnosis not present

## 2019-10-18 DIAGNOSIS — I69391 Dysphagia following cerebral infarction: Secondary | ICD-10-CM | POA: Diagnosis not present

## 2019-10-18 DIAGNOSIS — E1129 Type 2 diabetes mellitus with other diabetic kidney complication: Secondary | ICD-10-CM | POA: Diagnosis not present

## 2019-10-18 DIAGNOSIS — I10 Essential (primary) hypertension: Secondary | ICD-10-CM | POA: Diagnosis not present

## 2019-10-18 DIAGNOSIS — L98419 Non-pressure chronic ulcer of buttock with unspecified severity: Secondary | ICD-10-CM | POA: Diagnosis not present

## 2019-10-18 DIAGNOSIS — R4702 Dysphasia: Secondary | ICD-10-CM | POA: Diagnosis not present

## 2019-10-18 DIAGNOSIS — R140 Abdominal distension (gaseous): Secondary | ICD-10-CM | POA: Diagnosis not present

## 2019-10-18 DIAGNOSIS — R079 Chest pain, unspecified: Secondary | ICD-10-CM | POA: Diagnosis not present

## 2019-10-18 DIAGNOSIS — R531 Weakness: Secondary | ICD-10-CM | POA: Diagnosis not present

## 2019-10-18 DIAGNOSIS — D631 Anemia in chronic kidney disease: Secondary | ICD-10-CM | POA: Diagnosis not present

## 2019-10-18 DIAGNOSIS — I4891 Unspecified atrial fibrillation: Secondary | ICD-10-CM | POA: Diagnosis not present

## 2019-10-18 DIAGNOSIS — N179 Acute kidney failure, unspecified: Secondary | ICD-10-CM | POA: Diagnosis not present

## 2019-10-18 DIAGNOSIS — N178 Other acute kidney failure: Secondary | ICD-10-CM | POA: Diagnosis not present

## 2019-10-18 DIAGNOSIS — Z951 Presence of aortocoronary bypass graft: Secondary | ICD-10-CM | POA: Diagnosis not present

## 2019-10-18 DIAGNOSIS — R109 Unspecified abdominal pain: Secondary | ICD-10-CM | POA: Diagnosis not present

## 2019-10-18 DIAGNOSIS — I2699 Other pulmonary embolism without acute cor pulmonale: Secondary | ICD-10-CM | POA: Diagnosis not present

## 2019-10-18 DIAGNOSIS — T8249XA Other complication of vascular dialysis catheter, initial encounter: Secondary | ICD-10-CM | POA: Diagnosis not present

## 2019-10-18 DIAGNOSIS — S51802A Unspecified open wound of left forearm, initial encounter: Secondary | ICD-10-CM | POA: Diagnosis not present

## 2019-10-18 DIAGNOSIS — I214 Non-ST elevation (NSTEMI) myocardial infarction: Secondary | ICD-10-CM | POA: Diagnosis not present

## 2019-10-18 DIAGNOSIS — Z931 Gastrostomy status: Secondary | ICD-10-CM | POA: Diagnosis not present

## 2019-10-18 DIAGNOSIS — I2510 Atherosclerotic heart disease of native coronary artery without angina pectoris: Secondary | ICD-10-CM | POA: Diagnosis not present

## 2019-10-18 DIAGNOSIS — E46 Unspecified protein-calorie malnutrition: Secondary | ICD-10-CM | POA: Diagnosis not present

## 2019-10-18 DIAGNOSIS — R52 Pain, unspecified: Secondary | ICD-10-CM | POA: Diagnosis not present

## 2019-10-18 DIAGNOSIS — L209 Atopic dermatitis, unspecified: Secondary | ICD-10-CM | POA: Diagnosis not present

## 2019-10-18 DIAGNOSIS — L89313 Pressure ulcer of right buttock, stage 3: Secondary | ICD-10-CM | POA: Diagnosis not present

## 2019-10-18 DIAGNOSIS — Z48812 Encounter for surgical aftercare following surgery on the circulatory system: Secondary | ICD-10-CM | POA: Diagnosis not present

## 2019-10-18 DIAGNOSIS — L98499 Non-pressure chronic ulcer of skin of other sites with unspecified severity: Secondary | ICD-10-CM | POA: Diagnosis not present

## 2019-10-18 DIAGNOSIS — K219 Gastro-esophageal reflux disease without esophagitis: Secondary | ICD-10-CM | POA: Diagnosis not present

## 2019-10-18 DIAGNOSIS — R262 Difficulty in walking, not elsewhere classified: Secondary | ICD-10-CM | POA: Diagnosis not present

## 2019-10-18 DIAGNOSIS — I6930 Unspecified sequelae of cerebral infarction: Secondary | ICD-10-CM | POA: Diagnosis not present

## 2019-10-18 DIAGNOSIS — I119 Hypertensive heart disease without heart failure: Secondary | ICD-10-CM | POA: Diagnosis not present

## 2019-10-18 DIAGNOSIS — R5381 Other malaise: Secondary | ICD-10-CM | POA: Diagnosis not present

## 2019-10-18 DIAGNOSIS — Z992 Dependence on renal dialysis: Secondary | ICD-10-CM | POA: Diagnosis not present

## 2019-10-18 LAB — GLUCOSE, CAPILLARY
Glucose-Capillary: 145 mg/dL — ABNORMAL HIGH (ref 70–99)
Glucose-Capillary: 162 mg/dL — ABNORMAL HIGH (ref 70–99)
Glucose-Capillary: 200 mg/dL — ABNORMAL HIGH (ref 70–99)
Glucose-Capillary: 276 mg/dL — ABNORMAL HIGH (ref 70–99)

## 2019-10-18 LAB — RENAL FUNCTION PANEL
Albumin: 2.6 g/dL — ABNORMAL LOW (ref 3.5–5.0)
Anion gap: 13 (ref 5–15)
BUN: 37 mg/dL — ABNORMAL HIGH (ref 8–23)
CO2: 23 mmol/L (ref 22–32)
Calcium: 9.1 mg/dL (ref 8.9–10.3)
Chloride: 96 mmol/L — ABNORMAL LOW (ref 98–111)
Creatinine, Ser: 3.55 mg/dL — ABNORMAL HIGH (ref 0.61–1.24)
GFR calc Af Amer: 19 mL/min — ABNORMAL LOW (ref >60)
GFR calc non Af Amer: 16 mL/min — ABNORMAL LOW (ref >60)
Glucose, Bld: 175 mg/dL — ABNORMAL HIGH (ref 70–99)
Phosphorus: 3.3 mg/dL (ref 2.5–4.6)
Potassium: 3.9 mmol/L (ref 3.5–5.1)
Sodium: 132 mmol/L — ABNORMAL LOW (ref 135–145)

## 2019-10-18 LAB — MAGNESIUM: Magnesium: 1.9 mg/dL (ref 1.7–2.4)

## 2019-10-18 MED ORDER — NEPRO/CARBSTEADY PO LIQD: 480.0000 mL | ORAL | 0 refills | Status: DC

## 2019-10-18 NOTE — Progress Notes (Signed)
      [address]       [place] [address]             [phone]      34 Days Post-Op Procedure(s) (LRB): EVACUATION OF HEMATOMA (N/A)   Subjective:  Up in bed eating breakfast.  No new complaints.   Objective: Vital signs in last 24 hours: Temp:  [97.6 F (36.4 C)-98.3 F (36.8 C)] 98 F (36.7 C) (06/23 0403) Pulse Rate:  [100-108] 105 (06/23 0403) Cardiac Rhythm: Sinus tachycardia (06/23 0420) Resp:  [15-20] 17 (06/23 0403) BP: (101-158)/(63-94) 158/94 (06/23 0403) SpO2:  [100 %] 100 % (06/23 0403) Weight:  [84 kg-86.1 kg] 86.1 kg (06/23 0403)  Intake/Output from previous day: 06/22 0701 - 06/23 0700 In: -  Out: 2500   General appearance: alert, cooperative and no distress Heart: regular rate and rhythm Lungs: clear to auscultation bilaterally Abdomen: soft, non-tender; bowel sounds normal; no masses,  no organomegaly Extremities: extremities normal, atraumatic, no cyanosis or edema Wound: clean and dry, open dehiscence left radial no evidence of infeciton  Lab Results: Recent Labs    10/17/19 0723  WBC 12.3*  HGB 9.2*  HCT 29.1*  PLT 346   BMET:  Recent Labs    10/17/19 0254 10/18/19 0046  NA 131* 132*  K 3.9 3.9  CL 95* 96*  CO2 21* 23  GLUCOSE 187* 175*  BUN 83* 37*  CREATININE 6.48* 3.55*  CALCIUM 9.0 9.1    PT/INR: No results for input(s): LABPROT, INR in the last 72 hours. ABG    Component Value Date/Time   PHART 7.481 (H) 09/07/2019 0029   HCO3 23.3 09/07/2019 0029   TCO2 24 09/07/2019 0029   ACIDBASEDEF 5.0 (H) 08/18/2019 2341   O2SAT 99.0 09/07/2019 0029   CBG (last 3)  Recent Labs    10/17/19 2022 10/18/19 0012 10/18/19 0408  GLUCAP 166* 162* 145*    Assessment/Plan: S/P Procedure(s) (LRB): EVACUATION OF HEMATOMA (N/A)  1. CV- hemodynamically stable 2.. Pulm- no acute issues, off oxygen 3. Renal-ESRD, nephrology following for HD tomorrow 4. Nutrition- diet per SLP 5. Left open radial harvest  site, continue wet to dry dressing changes 6. Dispo- patient stable, hopefully for SNF today, COVID test  negative   LOS: 70 days    [name], PA-C 10/18/2019

## 2019-10-18 NOTE — Progress Notes (Signed)
34 Days Post-Op  Subjective: Patient is a very pleasant 74 year old male who underwent open chest surgery on 4/23 after cardiac arrest.  He had he had 3 chest washouts followed by sternal wound exploration and irrigation on 09/04/2019 in which plastic surgery was consulted for assistance with closure of sternal wound.  On evaluation today patient is alert and oriented and preparing to eat breakfast.  He has no complaints about his chest incision.  Incision is healing well, C/D/I.  No signs of infection, drainage, seroma/hematoma.  Objective: Vital signs in last 24 hours: Temp:  [97.6 F (36.4 C)-98.3 F (36.8 C)] 98 F (36.7 C) (06/23 0403) Pulse Rate:  [100-108] 105 (06/23 0403) Resp:  [15-20] 17 (06/23 0403) BP: (101-158)/(63-94) 158/94 (06/23 0403) SpO2:  [100 %] 100 % (06/23 0403) Weight:  [84 kg-86.1 kg] 86.1 kg (06/23 0403) Last BM Date: 10/16/19  Intake/Output from previous day: 06/22 0701 - 06/23 0700 In: -  Out: 2500  Intake/Output this shift: No intake/output data recorded.  General appearance: alert, cooperative and no distress Head: Normocephalic, without obvious abnormality, atraumatic Eyes: EOMs intact Resp: nonlabored Chest wall: Incision healing well, c/d/i. No signs of infection, drainage, seroma/hematoma.  Lab Results:  CBC    Component Value Date/Time   WBC 12.3 (H) 10/17/2019 0723   RBC 2.96 (L) 10/17/2019 0723   HGB 9.2 (L) 10/17/2019 0723   HCT 29.1 (L) 10/17/2019 0723   PLT 346 10/17/2019 0723   MCV 98.3 10/17/2019 0723   MCH 31.1 10/17/2019 0723   MCHC 31.6 10/17/2019 0723   RDW 18.2 (H) 10/17/2019 0723   LYMPHSABS 1.3 09/17/2019 0345   MONOABS 1.4 (H) 09/17/2019 0345   EOSABS 0.9 (H) 09/17/2019 0345   BASOSABS 0.1 09/17/2019 0345   BMET Recent Labs    10/17/19 0254 10/18/19 0046  NA 131* 132*  K 3.9 3.9  CL 95* 96*  CO2 21* 23  GLUCOSE 187* 175*  BUN 83* 37*  CREATININE 6.48* 3.55*  CALCIUM 9.0 9.1   PT/INR No results for  input(s): LABPROT, INR in the last 72 hours. ABG No results for input(s): PHART, HCO3 in the last 72 hours.  Invalid input(s): PCO2, PO2  Studies/Results: No results found.  Anti-infectives: Anti-infectives (From admission, onward)   Start     Dose/Rate Route Frequency Ordered Stop   10/04/19 0800  ceFAZolin (ANCEF) IVPB 2g/100 mL premix        2 g 200 mL/hr over 30 Minutes Intravenous To Radiology 10/03/19 1045 10/04/19 1545   09/19/19 1417  ceFAZolin (ANCEF) 2-4 GM/100ML-% IVPB       Note to Pharmacy: Rudene Re   : cabinet override      09/19/19 1417 09/20/19 0229   09/19/19 1330  ceFAZolin (ANCEF) IVPB 1 g/50 mL premix  Status:  Discontinued        1 g 100 mL/hr over 30 Minutes Intravenous Once 09/19/19 1317 09/19/19 1329   09/19/19 1330  ceFAZolin (ANCEF) IVPB 2g/100 mL premix        2 g 200 mL/hr over 30 Minutes Intravenous  Once 09/19/19 1327 09/19/19 1448   09/14/19 0915  vancomycin (VANCOCIN) IVPB 1000 mg/200 mL premix        1,000 mg 200 mL/hr over 60 Minutes Intravenous To Surgery 09/14/19 0907 09/15/19 0915   09/07/19 2000  vancomycin (VANCOREADY) IVPB 750 mg/150 mL        750 mg 150 mL/hr over 60 Minutes Intravenous  Once 09/07/19 1510 09/07/19 2245  09/06/19 2200  meropenem (MERREM) 500 mg in sodium chloride 0.9 % 100 mL IVPB        500 mg 200 mL/hr over 30 Minutes Intravenous Every 24 hours 09/06/19 1542 09/09/19 0015   09/06/19 2000  fluconazole (DIFLUCAN) IVPB 400 mg        400 mg 100 mL/hr over 120 Minutes Intravenous Every 24 hours 09/06/19 1542 09/08/19 2240   09/06/19 1800  vancomycin (VANCOREADY) IVPB 750 mg/150 mL        750 mg 150 mL/hr over 60 Minutes Intravenous  Once 09/06/19 1542 09/07/19 0022   09/06/19 1540  vancomycin variable dose per unstable renal function (pharmacist dosing)         Does not apply See admin instructions 09/06/19 1542 09/08/19 2359   09/06/19 1100  ceFAZolin (ANCEF) IVPB 2g/100 mL premix  Status:  Discontinued        2  g 200 mL/hr over 30 Minutes Intravenous 30 min pre-op 09/05/19 1717 09/06/19 1644   09/06/19 0600  ceFAZolin (ANCEF) IVPB 2g/100 mL premix  Status:  Discontinued        2 g 200 mL/hr over 30 Minutes Intravenous To Short Stay 09/06/19 0546 09/06/19 1644   09/02/19 2200  meropenem (MERREM) 1 g in sodium chloride 0.9 % 100 mL IVPB  Status:  Discontinued        1 g 200 mL/hr over 30 Minutes Intravenous Every 8 hours 09/02/19 1011 09/06/19 1542   09/02/19 2000  fluconazole (DIFLUCAN) IVPB 800 mg  Status:  Discontinued        800 mg 100 mL/hr over 240 Minutes Intravenous Every 24 hours 09/02/19 1011 09/06/19 1542   09/02/19 2000  fluconazole (DIFLUCAN) IVPB 800 mg  Status:  Discontinued        800 mg 200 mL/hr over 120 Minutes Intravenous Every 24 hours 09/02/19 1329 09/02/19 1330   09/02/19 1800  vancomycin (VANCOCIN) IVPB 1000 mg/200 mL premix  Status:  Discontinued        1,000 mg 200 mL/hr over 60 Minutes Intravenous Every 24 hours 09/02/19 1011 09/06/19 1542   09/02/19 0900  meropenem (MERREM) 1 g in sodium chloride 0.9 % 100 mL IVPB  Status:  Discontinued        1 g 200 mL/hr over 30 Minutes Intravenous Every 24 hours 09/01/19 1333 09/02/19 1011   09/01/19 1700  fluconazole (DIFLUCAN) IVPB 800 mg        800 mg 100 mL/hr over 240 Minutes Intravenous  Once 09/01/19 1645 09/01/19 2057   09/01/19 1630  fluconazole (DIFLUCAN) IVPB 400 mg  Status:  Discontinued        400 mg 100 mL/hr over 120 Minutes Intravenous  Once 09/01/19 1629 09/01/19 1645   09/01/19 1330  vancomycin variable dose per unstable renal function (pharmacist dosing)  Status:  Discontinued         Does not apply See admin instructions 09/01/19 1333 09/02/19 1325   09/01/19 0730  meropenem (MERREM) 1 g in sodium chloride 0.9 % 100 mL IVPB  Status:  Discontinued        1 g 200 mL/hr over 30 Minutes Intravenous Every 8 hours 09/01/19 0729 09/01/19 1327   08/31/19 2200  vancomycin (VANCOCIN) IVPB 1000 mg/200 mL premix  Status:   Discontinued        1,000 mg 200 mL/hr over 60 Minutes Intravenous Every 24 hours 08/30/19 2235 09/01/19 1330   08/25/19 0800  vancomycin (VANCOREADY) IVPB 1250 mg/250 mL  Status:  Discontinued        1,250 mg 166.7 mL/hr over 90 Minutes Intravenous Every 24 hours 08/24/19 2121 08/24/19 2128   08/25/19 0100  vancomycin (VANCOREADY) IVPB 1250 mg/250 mL  Status:  Discontinued        1,250 mg 166.7 mL/hr over 90 Minutes Intravenous Every 24 hours 08/24/19 2128 08/30/19 2235   08/24/19 2300  ceFEPIme (MAXIPIME) 2 g in sodium chloride 0.9 % 100 mL IVPB  Status:  Discontinued        2 g 200 mL/hr over 30 Minutes Intravenous Every 12 hours 08/24/19 2150 09/01/19 0729   08/24/19 1338  vancomycin (VANCOCIN) 1,000 mg in sodium chloride 0.9 % 1,000 mL irrigation  Status:  Discontinued          As needed 08/24/19 1338 08/24/19 1508   08/21/19 0000  ceFEPIme (MAXIPIME) 1 g in sodium chloride 0.9 % 100 mL IVPB  Status:  Discontinued        1 g 200 mL/hr over 30 Minutes Intravenous Every 24 hours 08/20/19 0954 08/24/19 2150   08/20/19 1200  vancomycin (VANCOCIN) IVPB 1000 mg/200 mL premix        1,000 mg 200 mL/hr over 60 Minutes Intravenous  Once 08/20/19 0954 08/21/19 0011   08/19/19 2129  vancomycin variable dose per unstable renal function (pharmacist dosing)  Status:  Discontinued         Does not apply See admin instructions 08/19/19 2129 08/24/19 2128   08/19/19 0000  ceFEPIme (MAXIPIME) 2 g in sodium chloride 0.9 % 100 mL IVPB  Status:  Discontinued        2 g 200 mL/hr over 30 Minutes Intravenous Every 24 hours 08/18/19 2351 08/20/19 0954   08/19/19 0000  vancomycin (VANCOREADY) IVPB 2000 mg/400 mL        2,000 mg 200 mL/hr over 120 Minutes Intravenous  Once 08/18/19 2351 08/19/19 0354   08/16/19 1731  vancomycin (VANCOCIN) powder  Status:  Discontinued          As needed 08/16/19 1732 08/16/19 1808   08/15/19 2045  vancomycin (VANCOCIN) IVPB 1000 mg/200 mL premix        1,000 mg 200 mL/hr  over 60 Minutes Intravenous  Once 08/15/19 1402 08/15/19 2209   08/15/19 1645  cefUROXime (ZINACEF) 1.5 g in sodium chloride 0.9 % 100 mL IVPB        1.5 g 200 mL/hr over 30 Minutes Intravenous Every 12 hours 08/15/19 1402 08/17/19 0700   08/15/19 0958  vancomycin (VANCOCIN) powder  Status:  Discontinued          As needed 08/15/19 1000 08/15/19 1402   08/15/19 0400  vancomycin (VANCOREADY) IVPB 1500 mg/300 mL        1,500 mg 150 mL/hr over 120 Minutes Intravenous To Surgery 08/14/19 1121 08/15/19 0830   08/15/19 0400  cefUROXime (ZINACEF) 1.5 g in sodium chloride 0.9 % 100 mL IVPB        1.5 g 200 mL/hr over 30 Minutes Intravenous To Surgery 08/14/19 1121 08/15/19 0812   08/15/19 0400  cefUROXime (ZINACEF) 750 mg in sodium chloride 0.9 % 100 mL IVPB        750 mg 200 mL/hr over 30 Minutes Intravenous To Surgery 08/14/19 1121 08/15/19 1300      Assessment/Plan: s/p Procedure(s): EVACUATION OF HEMATOMA Patient is doing well from plastics perspective, no changes. Support current plan to discharge to skilled facility.  Follow up as needed, call office with any  questions/concerns.   LOS: 70 days    Threasa Heads, [place] 10/18/2019

## 2019-10-18 NOTE — Progress Notes (Addendum)
RN gave report to [place] at [facility].  Pt belongings taken by wife. Wife at bedside during discharge transport by [facility].   IV (midline) removed per request of Paulina at [facility]  Pt transported by [facility] to [facility].  Discharge summary and scripts sent in discharge packet with PTAR to transfer facility.

## 2019-10-18 NOTE — TOC Progression Note (Signed)
Transition of Care [facility]) - Progression Note    Patient Details  Name: Christian [name] MRN: [id number] Date of Birth: 1946-02-01  Transition of Care [facility]) CM/SW [place], LCSW Phone Number: 10/18/2019, 10:13 AM  Clinical Narrative:    CSW spoke with patient's spouse and made her aware of plan to have patient picked up around 12pm to be taken to [facility]. CSW made SNF liaison aware that [facility] has been scheduled to pick patient up there tomorrow for his first session and they will need to be contacted to schedule further pickups. [place] to provide [employer] for patient.    Expected Discharge Plan: [place] Barriers to Discharge: Inadequate or no insurance, Other (comment)  Expected Discharge Plan and Services Expected Discharge Plan: [place] arrangements for the past 2 months: Single Family Home Expected Discharge Date: 10/18/19                                     Social Determinants of Health (SDOH) Interventions    Readmission Risk Interventions No flowsheet data found.

## 2019-10-18 NOTE — Discharge Summary (Signed)
Physician Discharge Summary       [address]       [place], [address]             [phone]    Patient ID: Christian [name] MRN: [id number] DOB/AGE: 01/03/1946 74 y.o.  Admit date: 08/09/2019 Discharge date: 10/18/2019  Admission Diagnoses: 1. S/p NSTEMI 2. Ischemic cardiomyopathy 3. Coronary artery disease  Discharge Diagnoses:  1. S/p CABG x 4 2. AKI (acute kidney injury) ([place]) 3. Expected post op blood loss anemia 4. History of HLD (hyperlipidemia) 5. History of HTN (hypertension) 6. History of benign prostatic hyperplasia 7. History of diabetes mellitus with polyneuropathy ([place]) 8. History of pulmonary embolism ([place])  Consults: cardiology  Procedure (s):    Coronary Artery Bypass Grafting x 4             Left Internal Mammary Artery to Distal Left Anterior Descending Coronary Artery; pedicled RIMA Graft to Posterior Descending Coronary Artery; left radial artery Graft to 1st Obtuse Marginal Branch and distal OM branches of Left Circumflex Coronary Artery as sequenced great; left radial artery harvesting; bilateral IMA harvesting  Completion indocyanine green fluorescence imaging Multilevel rib block with exparel solution  Procedure(s) and Anesthesia Type:    * Chest Closure S/P CABG WITH APPLICATION OF PREVENA  INCISIONAL WOUND VAC - General  History of Presenting Illness: Christian [name] is a 74 yo AA male with known history CAD, HTN, Hyperlipidemia, and DM, and BPH.  His CAD dates back to 2015 at which time he underwent stent placement to his PDA.  In follow up with Dr. Einar [name] on 06/01/2019 he had complaints or shortness of breath and mild fatigue.  He denied chest pain at that time.  He was started on Spironolactone during that visit, but due to an elevation in his creatinine this had to be discontinued.  The patient developed complaints of chest pain.  His previous symptoms of shortness of breath and fatigue had progressed and he also developed lower  extremity edema.  He presented to his PCP with above mentioned complaints.  She recommended the patient presented to the ED for evaluation.  On 4/14 work up in the ED consisted of CT scan which showed a right lower lobe pulmonary embolism.  EKG was obtained and showed non-specific ST-T wave changes.  Troponin levels were also positive.  He was ruled in for NSTEMI and Cardiology consult was requested for admission.  He was evaluated by Dr. Einar [name] who admitted patient for further care.  He was taken to the catheterization lab on 08/10/2019 and was found to have CAD.  It was felt coronary bypass grafting would be indicated and TCTS consult was requested.    Brief Hospital Course:   At the time of exam, the patient is having episodic chest pain.  It has responded to NTG drip and SL NTG.  The patient had previously been fairly active until recently when his above mentioned symptoms have limited his activity level.  Dr. Orvan [name] discussed the need for coronary artery bypass grafting surgery. Potential risks, benefits, and complications of the surgery were discussed with the patient and he agreed to proceed with surgery. Pre operative carotid duplex US showed no significant internal carotid artery stenosis bilaterally. Patient underwent a CABG x 4 on 08/15/2019.  He tolerated the procedure, but due to edema his chest was unable to be closed.  He was taken the SICU in stable condition.  He required transfusion of 2 units of packed cells  for expected post operative blood loss anemia.  He was started on Lasix gtt to help facilitate diuresis.  The patients edema improved and he was taken back to the operating room on 08/16/2018 for sternal closure with application of a Pravena wound vac.  The patient was weaned off Levophed and Milrinone as hemodynamics allowed.  He developed Atrial Flutter and was treated with IV Amiodarone.  He also required addition of Neo-synephrine for hypotension.  He also underwent bedside cardioversion  with successful restoration of NSR.  Advance heart failure team was consulted for post surgical volume management and systolic heart failure.  He was weaned and extubated on 08/18/2019.  Unfortunately later that afternoon the patient suffered a cardiac arrest.  CPR was performed and the patient was re-intubated.  He underwent emergent opening of his chest in the ICU with internal cardiac massage.  There was a large clot removed with improvement of hemodynamics.  During the procedure the patient was transfused 2 units of packed cells.  His chest was left open at that time.  Advanced heart failure was consulted and aided in management of patient's pressor support.  The patient developed AKI with creatinine level peaking at 5.27.  He was started on a lasix drip to facilitate urine output.  Nephrology consult was obtained and patient was initiated on CRRT.  He developed shock liver and statin therapy was discontinued.  He stabilized and was taken back to the operating room on 08/21/2019, 08/24/2019 for sternal washout.  His chest was closed with assistance of a wound vac.  He was weaned off all pressor support by 08/23/2019.  He developed abdominal distention.  He was felt to have an Ileus and an NG tube was placed.  He had brown output and there was some concern for aspiration.  He was on ABX due to his chest being open.  The patient remained on a ventilator and was sedated.  He was taken back to the operating room on 08/29/2019.  He underwent sternal washout with replacement of wound vac and Tracheostomy.  He continued to have issues with hypotension.  He required additional titration of Levophed.  His hemoglobin level dropped without acute signs of bleeding.  He was transfused 1 unit of packed cells.  He was taken off heparin.  He made some progress hemodynamically over the next several days.  His levophed was discontinued on 09/02/2019.  He was taken back to the operating room on 5/10 for sternal washout.  His chest was  unable to be closed.  It was felt the patient would require a flap closure.  General surgery and Plastic surgery were consulted for this.  He was taken back to the operating room on 5/12 and underwent Mediastinal exploration with omental flap to anterior mediastinum, sternal plating, and right pectoralis muscle advancement flap, excisional skin debridement and application of wound vac.  The patient tolerated the procedure without difficulty and was taken back to the SICU in stable condition.  The patient developed decreased movement on his left side.  Head CT was obtained on 5/13 and 5/15 neither of which showed an acute ischemic injury. Due to sternal plating he was unable to have an MRI.  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  They recommended Heparin therapy this was started on 5/19.  Unfortunately the morning of 5/20 he developed an acute hematoma of his previous sternal incision with decompensation.  He was taken emergently to the operating  for mediastinal exploration.  He required transfusion of several units of packed cells.  The patient tolerated the procedure without difficulty.  The patient has progressed since surgery.  He has been weaned off the ventilator.  His trach collar was decreased in size and ultimately removed on 09/29/2019.  He is working with SLP to progress to [place] and diet advancement as able.  Swallow study showed significant improvement.  Most recent swallow study dated 10/13/2019 shows no aspiration and speech has recommended a regular texture diet with thin liquids at this time.  He underwent IR placement of a Gastrostomy tube for nutritional feedings.  His JP drains have been removed from his sternal wound.  He has permanent dialysis access in place.  His trach has been decannulated and he is tolerating this well.  Unfortunately he remains weak and deconditioned.  He is not able to participate in an intense  inpatient rehabilitation program at this time.  It is felt patient will require LTAC placement. However, they have no dialysis beds available.  He therefore will require SNF placement.  He remains hemodynamically stable.  He is tolerating a T/TH/S dialysis schedule.  His wounds are healing without evidence of infection with the exception of his radial artery harvest site which has a superficial dehiscence in the lower arm which is now being packed with wet-to-dry saline dressings.  He was also debrided at the bedside of some fatty necrotic tissue.  There is no current evidence of infection at this time.Marland Kitchen  He is medically stable for discharge today to the nursing facility.  Latest Vital Signs: Blood pressure (!) 158/94, pulse (!) 105, temperature 98 F (36.7 C), temperature source Oral, resp. rate 17, height _0  (1.753 m), weight 86.1 kg, SpO2 100 %.  Physical Exam:  General appearance: alert, cooperative and no distress Heart: regular rate and rhythm Lungs: clear to auscultation bilaterally Abdomen: soft, non-tender; bowel sounds normal; no masses,  no organomegaly Extremities: extremities normal, atraumatic, no cyanosis or edema Wound: clean and dry radial site, sternotomy C/D/I, J tube site clean  Discharge Condition: Stable and discharged to SNF  Recent laboratory studies:  Lab Results  Component Value Date   WBC 12.3 (H) 10/17/2019   HGB 9.2 (L) 10/17/2019   HCT 29.1 (L) 10/17/2019   MCV 98.3 10/17/2019   PLT 346 10/17/2019   Lab Results  Component Value Date   NA 132 (L) 10/18/2019   K 3.9 10/18/2019   CL 96 (L) 10/18/2019   CO2 23 10/18/2019   CREATININE 3.55 (H) 10/18/2019   GLUCOSE 175 (H) 10/18/2019      Diagnostic Studies: CT ABDOMEN WO CONTRAST  Result Date: 09/28/2019 CLINICAL DATA:  Evaluation for possible percutaneous gastrostomy tube placement. EXAM: CT ABDOMEN WITHOUT CONTRAST TECHNIQUE: Multidetector CT imaging of the abdomen was performed following the  standard protocol without IV contrast. COMPARISON:  CT of the chest on 09/18/2019 FINDINGS: Lower chest: Atelectasis at the posterior left lung base. Hepatobiliary: No focal liver abnormality is [name]. No gallstones, gallbladder wall thickening, or biliary dilatation. Pancreas: Unremarkable. No pancreatic ductal dilatation or surrounding inflammatory changes. Spleen: Normal in size without focal abnormality. Adrenals/Urinary Tract: Stable hyperplastic and nodular appearance of the adrenal glands bilaterally. Cyst of the lateral mid left kidney demonstrates internal simple fluid density and measures approximately 2.6 cm. No hydronephrosis or renal calculi identified. Stomach/Bowel: No evidence of hiatal hernia. Feeding tube in place that extends into the proximal jejunum. Gastric positioning normal with some of the stomach located  posterior to the left lobe of the liver when decompressed. The stomach does lie superior to the transverse colon. No evidence of bowel obstruction, ileus, inflammation or free air. Vascular/Lymphatic: No significant vascular findings are present. No enlarged abdominal lymph nodes. Other: No ascites, body wall edema or focal fluid collections. No focal hernias. Musculoskeletal: No acute or significant osseous findings. IMPRESSION: 1. Gastric positioning normal with some of the stomach located posterior to the left lobe of the liver when decompressed. There would likely be an adequate window for percutaneous gastrostomy tube placement after air insufflation of the stomach. 2. Stable hyperplastic and nodular appearance of the adrenal glands bilaterally. 3. Atelectasis at the posterior left lung base. Electronically Signed   By: Aletta Edouard M.D.   On: 09/28/2019 16:50   DG Chest 2 View  Result Date: 10/08/2019 CLINICAL DATA:  Chest pain EXAM: CHEST - 2 VIEW COMPARISON:  10/05/2019 FINDINGS: Cardiac shadow remains enlarged. Postsurgical changes are noted. Tracheostomy tube is been removed  in the interval. Left jugular dialysis catheter is again noted and stable. The right lung is well aerated without focal infiltrate. Minimal left pleural effusion is noted stable from the prior study. IMPRESSION: Stable small left pleural effusion. No new acute abnormality noted. Electronically Signed   By: Inez Catalina M.D.   On: 10/08/2019 15:27   DG Chest 2 View  Result Date: 10/05/2019 CLINICAL DATA:  Postoperative follow-up. EXAM: CHEST - 2 VIEW COMPARISON:  Sep 23, 2019 FINDINGS: Multiple sternal fixation plates and screws are [name]. There is stable tracheostomy tube and left-sided venous catheter positioning. The nasogastric tube [name] on the prior study has been removed. There is no evidence of acute infiltrate or pneumothorax. A small left pleural effusion is [name]. The heart size and mediastinal contours are within normal limits. The visualized skeletal structures are unremarkable. IMPRESSION: Postoperative changes, as described above, with a small left pleural effusion. Electronically Signed   By: Virgina Norfolk M.D.   On: 10/05/2019 17:37   CT CHEST WO CONTRAST  Result Date: 09/18/2019 CLINICAL DATA:  Chest pain, shortness of breath, pleural effusion at, history of coronary artery disease post CABG, diabetes mellitus, hypertension EXAM: CT CHEST WITHOUT CONTRAST TECHNIQUE: Multidetector CT imaging of the chest was performed following the standard protocol without IV contrast. Sagittal and coronal MPR images reconstructed from axial data set. COMPARISON:  08/09/2019 FINDINGS: Cardiovascular: Atherosclerotic calcifications aorta and coronary arteries. Postsurgical changes of CABG. LEFT central venous catheter with tip at SVC. Enlargement of cardiac chambers. Minimal pericardial effusion. Aorta normal caliber. Mediastinum/Nodes: Tip of tracheostomy tube above carina. Nasogastric tube extends into stomach. Two surgical drains in the anterior chest wall. Esophagus unremarkable. 6 mm RIGHT thyroid  nodule; BILATERAL pleural effusions and scattered atelectasis greatest in LEFT lower lobe. No definite infiltrate or pneumothorax. (Ref: J Am Coll Radiol. 2015 Feb;12(2): 143-50). Base of cervical region otherwise normal appearance. Stranding of the anterior mediastinum likely related to prior cardiac surgery and sternal dehiscence, with herniation of fat through the sternotomy. No thoracic adenopathy. Lungs/Pleura: BILATERAL pleural effusions, small. Scattered atelectasis in both lungs greatest LEFT lower lobe. Pleural calcifications in hemithoraces bilaterally likely reflects asbestos exposure. No infiltrate or pneumothorax. Upper Abdomen: Diffuse somewhat nodular thickening of the adrenal glands bilaterally likely representing multiple small nodules question adenomas as noted on prior exam. High attenuation material within the gallbladder question vicarious excretion of prior contrast. Hepatic margins appear slightly irregular raising question of cirrhosis Musculoskeletal: No additional osseous findings. Diffuse stranding identified along the  RIGHT pectoralis major and minor muscles which could be related to prior surgery, hemorrhage, or infection. No soft tissue gas. IMPRESSION: BILATERAL pleural effusions and scattered atelectasis greatest in LEFT lower lobe. Pleural calcifications in hemithoraces bilaterally question asbestos exposure. Stranding of the anterior mediastinum likely related to prior cardiac surgery and sternal dehiscence, with herniation of fat through the sternotomy. Diffuse stranding along the RIGHT pectoralis major and minor muscles which could be related to prior surgery, hemorrhage, or infection. 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 Minimally irregular hepatic margins cannot exclude cirrhosis. Aortic Atherosclerosis (ICD10-I70.0). Electronically Signed   By: Lavonia Dana M.D.   On: 09/18/2019 15:27   IR GASTROSTOMY TUBE MOD SED  Result Date: 10/04/2019 CLINICAL DATA:  Dysphagia, needs enteral  feeding support EXAM: PERC PLACEMENT GASTROSTOMY FLUOROSCOPY TIME:  2 minutes 18 seconds; 22 mGy TECHNIQUE: The procedure, risks, benefits, and alternatives were explained to the patient. Questions regarding the procedure were encouraged and answered. The patient understands and consents to the procedure. As antibiotic prophylaxis, cefazolin 2 g was ordered pre-procedure and administered intravenously within one hour of incision. A safe approach avoiding the transverse colon was evident fluoroscopically due to residual contrast in the lumen of the decompressed colon. A 5 French angiographic catheter was placed as orogastric tube. The upper abdomen was prepped with Betadine, draped in usual sterile fashion, and infiltrated locally with 1% lidocaine. Intravenous Fentanyl 95mg and Versed 122mwere administered as conscious sedation during continuous monitoring of the patient's level of consciousness and physiological / cardiorespiratory status by the radiology RN, with a total moderate sedation time of 10 minutes. 0.5 mg glucagon given IV to facilitate gastric distention. Stomach was insufflated using air through the orogastric tube. An 1886rench sheath needle was advanced percutaneously into the gastric lumen under fluoroscopy. Gas could be aspirated and a small contrast injection confirmed intraluminal spread. The sheath was exchanged over a guidewire for a 9 [place] sheath, through which the snare device was advanced and used to snare a guidewire passed through the orogastric tube. This was withdrawn, and the snare attached to the 20 French pull-through gastrostomy tube, which was advanced antegrade, positioned with the internal bumper securing the anterior gastric wall to the anterior abdominal wall. Small contrast injection confirms appropriate positioning. The external bumper was applied and the catheter was flushed. COMPLICATIONS: COMPLICATIONS none IMPRESSION: 1. Technically successful 20 French  pull-through gastrostomy placement under fluoroscopy. Electronically Signed   By: D Lucrezia Europe.D.   On: 10/04/2019 17:03   IR Fluoro Guide CV Line Left  Result Date: 09/19/2019 INDICATION: End-stage renal disease. In need of durable intravenous access for continuation dialysis. Note, patient has existing left external jugular vein approach temporary dialysis catheter. Patient currently with open median sternotomy with subcutaneous surgical drains involving the ventral aspect of the chest bilaterally, the right-side of which courses regional to the expected subcutaneous track of a right chest dialysis catheter. Additionally, there is a skin defect/potential developing abscess involving the ventral aspect of the right upper chest, at the location of the standard entrance site of a right internal jugular approach dialysis catheter. As such, we will proceed with image guided placement of a left internal jugular approach dialysis catheter and removal of the temporary left external jugular approach dialysis catheter. EXAM: TUNNELED CENTRAL VENOUS HEMODIALYSIS CATHETER PLACEMENT WITH ULTRASOUND AND FLUOROSCOPIC GUIDANCE MEDICATIONS: Ancef 2 gm IV . The antibiotic was given in an appropriate time interval prior to skin puncture. ANESTHESIA/SEDATION: Moderate (conscious) sedation was employed during this procedure. A  total of Versed 1.5 mg and Fentanyl 75 mcg was administered intravenously. Moderate Sedation Time: 17 minutes. The patient's level of consciousness and vital signs were monitored continuously by radiology nursing throughout the procedure under my direct supervision. FLUOROSCOPY TIME:  1 minute, 6 seconds (11 mGy) COMPLICATIONS: None immediate. PROCEDURE: Informed written consent was obtained from the patient's family after a discussion of the risks, benefits, and alternatives to treatment. Questions regarding the procedure were encouraged and answered. The left neck and chest as well as the external portion  of the existing left external jugular approach dialysis catheter were prepped with chlorhexidine in a sterile fashion, and a sterile drape was applied covering the operative field. Maximum barrier sterile technique with sterile gowns and gloves were used for the procedure. A timeout was performed prior to the initiation of the procedure. After creating a small venotomy incision, a micropuncture kit was utilized to access the internal jugular vein. Real-time ultrasound guidance was utilized for vascular access including the acquisition of a permanent ultrasound image documenting patency of the accessed vessel. The microwire was utilized to measure appropriate catheter length. A stiff Glidewire was advanced to the level of the IVC and the micropuncture sheath was exchanged for a peel-away sheath. A palindrome tunneled hemodialysis catheter measuring 28 cm from tip to cuff was tunneled in a retrograde fashion from the anterior chest wall to the venotomy incision. Next, the external jugular approach temporary dialysis catheter was removed and superficial hemostasis was achieved with manual compression. The new catheter was then placed through the peel-away sheath with tips ultimately positioned within the superior aspect of the right atrium. Final catheter positioning was confirmed and documented with a spot radiographic image. The catheter aspirates and flushes normally. The catheter was flushed with appropriate volume heparin dwells. The catheter exit site was secured with a 0-Prolene retention suture. The venotomy incision was closed with Dermabond and Steri-strips. Dressings were applied. The patient tolerated the procedure well without immediate post procedural complication. IMPRESSION: Successful placement of 28 cm tip to cuff tunneled hemodialysis catheter via the left internal jugular vein with tips terminating within the superior aspect of the right atrium. The catheter is ready for immediate use.  Electronically Signed   By: Sandi Mariscal M.D.   On: 09/19/2019 15:25   IR US Guide Vasc Access Left  Result Date: 09/19/2019 INDICATION: End-stage renal disease. In need of durable intravenous access for continuation dialysis. Note, patient has existing left external jugular vein approach temporary dialysis catheter. Patient currently with open median sternotomy with subcutaneous surgical drains involving the ventral aspect of the chest bilaterally, the right-side of which courses regional to the expected subcutaneous track of a right chest dialysis catheter. Additionally, there is a skin defect/potential developing abscess involving the ventral aspect of the right upper chest, at the location of the standard entrance site of a right internal jugular approach dialysis catheter. As such, we will proceed with image guided placement of a left internal jugular approach dialysis catheter and removal of the temporary left external jugular approach dialysis catheter. EXAM: TUNNELED CENTRAL VENOUS HEMODIALYSIS CATHETER PLACEMENT WITH ULTRASOUND AND FLUOROSCOPIC GUIDANCE MEDICATIONS: Ancef 2 gm IV . The antibiotic was given in an appropriate time interval prior to skin puncture. ANESTHESIA/SEDATION: Moderate (conscious) sedation was employed during this procedure. A total of Versed 1.5 mg and Fentanyl 75 mcg was administered intravenously. Moderate Sedation Time: 17 minutes. The patient's level of consciousness and vital signs were monitored continuously by radiology nursing throughout the procedure under  my direct supervision. FLUOROSCOPY TIME:  1 minute, 6 seconds (11 mGy) COMPLICATIONS: None immediate. PROCEDURE: Informed written consent was obtained from the patient's family after a discussion of the risks, benefits, and alternatives to treatment. Questions regarding the procedure were encouraged and answered. The left neck and chest as well as the external portion of the existing left external jugular approach  dialysis catheter were prepped with chlorhexidine in a sterile fashion, and a sterile drape was applied covering the operative field. Maximum barrier sterile technique with sterile gowns and gloves were used for the procedure. A timeout was performed prior to the initiation of the procedure. After creating a small venotomy incision, a micropuncture kit was utilized to access the internal jugular vein. Real-time ultrasound guidance was utilized for vascular access including the acquisition of a permanent ultrasound image documenting patency of the accessed vessel. The microwire was utilized to measure appropriate catheter length. A stiff Glidewire was advanced to the level of the IVC and the micropuncture sheath was exchanged for a peel-away sheath. A palindrome tunneled hemodialysis catheter measuring 28 cm from tip to cuff was tunneled in a retrograde fashion from the anterior chest wall to the venotomy incision. Next, the external jugular approach temporary dialysis catheter was removed and superficial hemostasis was achieved with manual compression. The new catheter was then placed through the peel-away sheath with tips ultimately positioned within the superior aspect of the right atrium. Final catheter positioning was confirmed and documented with a spot radiographic image. The catheter aspirates and flushes normally. The catheter was flushed with appropriate volume heparin dwells. The catheter exit site was secured with a 0-Prolene retention suture. The venotomy incision was closed with Dermabond and Steri-strips. Dressings were applied. The patient tolerated the procedure well without immediate post procedural complication. IMPRESSION: Successful placement of 28 cm tip to cuff tunneled hemodialysis catheter via the left internal jugular vein with tips terminating within the superior aspect of the right atrium. The catheter is ready for immediate use. Electronically Signed   By: Sandi Mariscal M.D.   On:  09/19/2019 15:25   DG Chest Port 1 View  Result Date: 09/23/2019 CLINICAL DATA:  History of recent mediastinal expiration EXAM: PORTABLE CHEST 1 VIEW COMPARISON:  09/18/2019 FINDINGS: Postsurgical changes are again [name] and stable. New left jugular dialysis catheter is noted with the tip in the right atrium. Tracheostomy tube and feeding catheter are again [name]. Slight increased density is noted in the left hemithorax likely related to posterior effusion. No focal infiltrate is noted. No pneumothorax is [name]. IMPRESSION: Left-sided pleural effusion. New left jugular dialysis catheter. Remainder of the exam is stable. Electronically Signed   By: Inez Catalina M.D.   On: 09/23/2019 09:22   DG Abd Portable 1V  Result Date: 09/30/2019 CLINICAL DATA:  Feeding tube position EXAM: PORTABLE ABDOMEN - 1 VIEW COMPARISON:  08/30/2019 FINDINGS: A feeding tube is in place with tip in the fourth portion of the duodenum. The lower margin of a catheter projects over the right atrium, probably a dialysis catheter. Lower thoracic spondylosis. The visualized portion of the bowel gas pattern is unremarkable. IMPRESSION: 1. Feeding tube tip: 4th portion of the duodenum. 2. A catheter terminates in the right atrium from above. Electronically Signed   By: Van Clines M.D.   On: 09/30/2019 13:26   DG Swallowing Func-Speech Pathology  Result Date: 10/13/2019 Objective Swallowing Evaluation: Type of Study: MBS-Modified Barium Swallow Study  Patient Details Name: JACOBS GOLAB MRN: [id number] Date of Birth:  [birth year] Today's Date: 10/13/2019 Time: SLP Start Time (ACUTE ONLY): 1037 -SLP Stop Time (ACUTE ONLY): 1100 SLP Time Calculation (min) (ACUTE ONLY): 23 min Past Medical History: Past Medical History: Diagnosis Date . BPH (benign prostatic hypertrophy)  . Coronary artery disease  . Diabetes mellitus without complication ([place])  . Dry eyes left . Hiatal hernia  . 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  LIMA TO LAD, RIMA TO PDA, RADIAL ARTERY TO CIRC AND SEQUENTIALLY TO OM1. 08/15/2019 . Hyperlipidemia  . Hypertension  . Incomplete bladder emptying  . Nocturia  . 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 SOB (shortness of breath) on exertion  Past Surgical History: Past Surgical History: Procedure Laterality Date . APPLICATION OF WOUND VAC N/A 08/13/6220  Procedure: APPLICATION OF WOUND VAC;  Surgeon: [name], MD;  Location: [place];  Service: Thoracic;  Laterality: N/A; . APPLICATION OF WOUND VAC  08/29/2019  Procedure: Wound Vac change;  Surgeon: [name], MD;  Location: [place] [facility];  Service: Open Heart Surgery;; . APPLICATION OF WOUND VAC N/A 9/79/8921  Procedure: Application Of Wound Vac;  Surgeon: Grace Sourish, MD;  Location: [place];  Service: Open Heart Surgery;  Laterality: N/A; . APPLICATION OF WOUND VAC N/A 1/94/1740  Procedure: APPLICATION OF ACELL, APPLICATION OF WOUND VAC USING PREVENA INCISIONAL  DRESSING;  Surgeon: Wallace Going, DO;  Location: [place];  Service: Plastics;  Laterality: N/A; . CARDIAC CATHETERIZATION   . CORONARY ARTERY BYPASS GRAFT N/A 08/15/2019  Procedure: CORONARY ARTERY BYPASS GRAFTING (CABG), x 4 using bilateral IMAs and left radial artery .  LIMA TO LAD, RIMA TO PDA, RADIAL ARTERY TO CIRC AND SEQUENTIALLY TO OM1.;  Surgeon: [name], MD;  Location: [place];  Service: Open Heart Surgery;  Laterality: N/A; . CORONARY STENT PLACEMENT  02/27/2014  distal rt/pd coronary       dr Christian [name] . CYSTO/ BLADDER BIOPSY'S/ CAUTHERIZATION  01-14-2004  DR Gaynelle Arabian . EXPLORATION POST OPERATIVE OPEN HEART N/A 08/16/2019  Procedure: Chest Closure S?P CABG WITH APPLICATION OF PREVENA  INCISIONAL WOUND VAC;  Surgeon: [name], MD;  [facility];  Service: Open Heart Surgery;  Laterality: N/A; . EXPLORATION POST OPERATIVE OPEN HEART N/A 08/21/2019   Procedure: CHEST WASHOUT S/P OPEN CHEST;  Surgeon: [name], MD;  Location: [place];  Service: Open Heart Surgery;  Laterality: N/A;  Open chest with Esmark dressing with Ioban sealant coverage. . EXPLORATION POST OPERATIVE OPEN HEART N/A 08/18/2019  Procedure: EXPLORATION POST OPERATIVE OPEN HEART (performed 04/23 on unit);  Surgeon: [name], MD;  Location: [facility];  Service: Open Heart Surgery;  Laterality: N/A; . EXPLORATION POST OPERATIVE OPEN HEART N/A 08/24/2019  Procedure: CHEST WASHOUT POST OPERATIVE OPEN HEART;  Surgeon: [name], MD;  Location: [place];  Service: Open Heart Surgery;  Laterality: N/A; . EXPLORATION POST OPERATIVE OPEN HEART N/A 08/29/2019  Procedure: CHEST WOUND WASHOUT POST OPERATIVE OPEN HEART;  Surgeon: [name], MD;  Location: [place];  Service: Open Heart Surgery;  Laterality: N/A; . EXPLORATION POST OPERATIVE OPEN HEART N/A 09/04/2019  Procedure: MEDIASTINAL EXPLORATION WITH STERNAL WOUND IRRIGATION;  Surgeon: Grace Dhilan, MD;  Location: [place];  Service: Open Heart Surgery;  Laterality: N/A; . EXPLORATION POST OPERATIVE OPEN HEART N/A 09/14/2019  Procedure: EVACUATION OF HEMATOMA;  Surgeon:  Grace Thailand, MD;  Location: [place];  Service: Open Heart Surgery;  Laterality: N/A; . IR FLUORO GUIDE CV LINE LEFT  09/19/2019 . IR GASTROSTOMY TUBE MOD SED  10/04/2019 . IR PATIENT EVAL TECH 0-60 MINS  09/29/2019 . IR US GUIDE VASC ACCESS LEFT  09/19/2019 . LAPAROSCOPIC LYSIS OF ADHESIONS N/A 09/06/2019  Procedure: LAPAROSCOPIC OMENTAL HARVEST;  Surgeon: Kinsinger, Arta Bruce, MD;  Location: [place];  Service: General;  Laterality: N/A; . LEFT HEART CATH AND CORONARY ANGIOGRAPHY N/A 08/10/2019  Procedure: LEFT HEART CATH AND CORONARY ANGIOGRAPHY;  Surgeon: Nigel Mormon, MD;  Location: [place] [facility];  Service: Cardiovascular;  Laterality: N/A; . LEFT HEART CATHETERIZATION WITH CORONARY ANGIOGRAM N/A 02/27/2014  Procedure: LEFT HEART CATHETERIZATION WITH  CORONARY ANGIOGRAM;  Surgeon: Laverda Page, MD;  Location: [facility] CATH LAB;  Service: Cardiovascular;  Laterality: N/A; . MEDIASTINAL EXPLORATION N/A 09/06/2019  Procedure: MEDIASTINAL EXPLORATION;  Surgeon: Grace Jereme, MD;  Location: [place];  Service: Thoracic;  Laterality: N/A; . PECTORALIS FLAP  09/06/2019  Procedure: Pectoralis ADVANCEMENT Flap;  Surgeon: Wallace Going, DO;  Location: [place];  Service: Plastics;; . PERCUTANEOUS CORONARY STENT INTERVENTION (PCI-S)  02/27/2014  Procedure: PERCUTANEOUS CORONARY STENT INTERVENTION (PCI-S);  Surgeon: Laverda Page, MD;  Location[facility] CATH LAB;  Service: Cardiovascular;;  rt PDA  3.0/28mm Promus stent . RADIAL ARTERY HARVEST Left 08/15/2019  Procedure: Radial Artery Harvest;  Surgeon: [name], MD;  Location: [place];  Service: Open Heart Surgery;  Laterality: Left; . RIB PLATING N/A 09/06/2019  Procedure: STERNAL PLATING;  Surgeon: Grace Clara, MD;  Location: [place];  Service: Thoracic;  Laterality: N/A; . TEE WITHOUT CARDIOVERSION N/A 08/15/2019  Procedure: TRANSESOPHAGEAL ECHOCARDIOGRAM (TEE);  Surgeon: [name], MD;  Location: [place];  Service: Open Heart Surgery;  Laterality: N/A; . TRACHEOSTOMY TUBE PLACEMENT  08/29/2019  Procedure: Tracheostomy;  Surgeon: [name], MD;  Location: [facility] [facility];  Service: Open Heart Surgery;; . TRANSURETHRAL RESECTION OF PROSTATE  04/04/2012  Procedure: TRANSURETHRAL RESECTION OF THE PROSTATE WITH GYRUS INSTRUMENTS;  Surgeon: Ailene Rud, MD;  Location: [place];  Service: Urology;  Laterality: N/A; . TRANSURETHRAL RESECTION OF PROSTATE N/A 09/27/2014  Procedure: TRANSURETHRAL RESECTION OF THE PROSTATE ;  Surgeon: Carolan Clines, MD;  [facility];  Service: Urology;  Laterality: N/A; . UPPER GASTROINTESTINAL ENDOSCOPY   HPI: 74 yo admitted with NSTEMI 4/15, RLL PE, 4/20 CABG x 4 remained open with wound VAC with repeated washouts. 4/23 cardiac arrest, 4/29 initiated  CRRT, vent s/p trach. 5/10 additional sternal washout unable to close.  5/12 sternal plating with pectoralis flap and omental harvest to mediastinum off CRRT with iHD started. Chest wound closed. Neurology consulted for left sided weakness- CTH neg 09/08/19. 5/20 pt with emergent hematoma evacuation and transition to trach collar; 5/28 downsized to #4 cuffless; ultimately decannulated. PMHx: BPH, CAD, DM, HLD, HTN Had MBS on 09/30/19, weakness, poor visualization of airway, could not recommend thin liquids. FEES repeated to determine best diet textures. PEG placed on 10/04/19. Trach decannulated on 10/05/19.  Subjective: Awake, alert Assessment / Plan / Recommendation CHL IP CLINICAL IMPRESSIONS 10/13/2019 Clinical Impression   Pt presents with mild oropharyngeal dysphagia characterized by impaired bolus cohesion with premature spillage to the valleculae and a pharyngeal delay. Pt's swallow was triggered with the head of the bolus at the level of the valleculae with thin liquids via cup and at the pyriform sinuses with thin liquids via  straw. He demonstrated penetration (PAS 3) with consecutive swallows of thin liquids via straw secondary to the pharyngeal delay but no aspiration was noted. A regular texture diet with thin liquids is recommended at this time with observance of swallowing precautions. SLP will follow pt briefly to ensure diet tolerance.  SLP Visit Diagnosis Dysphagia, oropharyngeal phase (R13.12) Attention and concentration deficit following -- Frontal lobe and executive function deficit following -- Impact on safety and function Mild aspiration risk   CHL IP TREATMENT RECOMMENDATION 10/13/2019 Treatment Recommendations Therapy as outlined in treatment plan below   Prognosis 10/13/2019 Prognosis for Safe Diet Advancement Good Barriers to Reach Goals -- Barriers/Prognosis Comment -- CHL IP DIET RECOMMENDATION 10/13/2019 SLP Diet Recommendations Regular solids;Thin liquid Liquid Administration via Cup;Straw  Medication Administration Whole meds with liquid Compensations Slow rate;Small sips/bites Postural Changes --   CHL IP OTHER RECOMMENDATIONS 10/13/2019 Recommended Consults -- Oral Care Recommendations Oral care BID Other Recommendations --   CHL IP FOLLOW UP RECOMMENDATIONS 10/13/2019 Follow up Recommendations None   CHL IP FREQUENCY AND DURATION 10/13/2019 Speech Therapy Frequency (ACUTE ONLY) min 2x/week Treatment Duration 1 week      CHL IP ORAL PHASE 10/13/2019 Oral Phase Impaired Oral - Pudding Teaspoon -- Oral - Pudding Cup -- Oral - Honey Teaspoon -- Oral - Honey Cup -- Oral - Nectar Teaspoon NT Oral - Nectar Cup -- Oral - Nectar Straw NT Oral - Thin Teaspoon -- Oral - Thin Cup Premature spillage;Decreased bolus cohesion Oral - Thin Straw Premature spillage;Decreased bolus cohesion Oral - Puree WFL Oral - Mech Soft -- Oral - Regular -- Oral - Multi-Consistency -- Oral - Pill -- Oral Phase - Comment --  CHL IP PHARYNGEAL PHASE 10/13/2019 Pharyngeal Phase Impaired Pharyngeal- Pudding Teaspoon -- Pharyngeal -- Pharyngeal- Pudding Cup -- Pharyngeal -- Pharyngeal- Honey Teaspoon -- Pharyngeal -- Pharyngeal- Honey Cup -- Pharyngeal -- Pharyngeal- Nectar Teaspoon NT Pharyngeal -- Pharyngeal- Nectar Cup -- Pharyngeal -- Pharyngeal- Nectar Straw -- Pharyngeal -- Pharyngeal- Thin Teaspoon NT Pharyngeal -- Pharyngeal- Thin Cup Delayed swallow initiation-vallecula Pharyngeal -- Pharyngeal- Thin Straw Reduced epiglottic inversion;Penetration/Aspiration during swallow;Delayed swallow initiation-pyriform sinuses Pharyngeal Material enters airway, remains ABOVE vocal cords and not ejected out Pharyngeal- Puree WFL Pharyngeal Material does not enter airway Pharyngeal- Mechanical Soft -- Pharyngeal -- Pharyngeal- Regular WFL Pharyngeal -- Pharyngeal- Multi-consistency -- Pharyngeal -- Pharyngeal- Pill -- Pharyngeal -- Pharyngeal Comment --  CHL IP CERVICAL ESOPHAGEAL PHASE 10/13/2019 Cervical Esophageal Phase WFL Pudding Teaspoon  -- Pudding Cup -- Honey Teaspoon -- Honey Cup -- Nectar Teaspoon -- Nectar Cup -- Nectar Straw -- Thin Teaspoon -- Thin Cup -- Thin Straw -- Puree -- Mechanical Soft -- Regular -- Multi-consistency -- Pill -- Cervical Esophageal Comment -- Shanika I. Hardin Negus, [employer], [place] Office number [phone] Pager [phone] Horton Marshall 10/13/2019, 11:42 AM              DG Swallowing Func-Speech Pathology  Result Date: 09/30/2019 Objective Swallowing Evaluation: Type of Study: MBS-Modified Barium Swallow Study  Patient Details Name: ARDIE MCLENNAN MRN: [id number] Date of Birth: [birth year] Today's Date: 09/30/2019 Time: SLP Start Time (ACUTE ONLY): 1400 -SLP Stop Time (ACUTE ONLY): 1416 SLP Time Calculation (min) (ACUTE ONLY): 16 min Past Medical History: Past Medical History: Diagnosis Date . BPH (benign prostatic hypertrophy)  . Coronary artery disease  . Diabetes mellitus without complication ([place])  . Dry eyes left . Hiatal hernia  . 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  LIMA TO LAD, RIMA TO PDA, RADIAL ARTERY TO CIRC AND SEQUENTIALLY TO OM1. 08/15/2019 . Hyperlipidemia  . Hypertension  . Incomplete bladder emptying  . Nocturia  . 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 SOB (shortness of breath) on exertion  Past Surgical History: Past Surgical History: Procedure Laterality Date . APPLICATION OF WOUND VAC N/A 8/93/8101  Procedure: APPLICATION OF WOUND VAC;  Surgeon: [name], MD;  Location: [place];  Service: Thoracic;  Laterality: N/A; . APPLICATION OF WOUND VAC  08/29/2019  Procedure: Wound Vac change;  Surgeon: [name], MD;  Location: [place] [facility];  Service: Open Heart Surgery;; . APPLICATION OF WOUND VAC N/A 7/51/0258  Procedure: Application Of Wound Vac;  Surgeon: Grace Cristobal, MD;  Location: [place];  Service: Open Heart Surgery;   Laterality: N/A; . APPLICATION OF WOUND VAC N/A 09/21/7822  Procedure: APPLICATION OF ACELL, APPLICATION OF WOUND VAC USING PREVENA INCISIONAL  DRESSING;  Surgeon: Wallace Going, DO;  Location: [place];  Service: Plastics;  Laterality: N/A; . CARDIAC CATHETERIZATION   . CORONARY ARTERY BYPASS GRAFT N/A 08/15/2019  Procedure: CORONARY ARTERY BYPASS GRAFTING (CABG), x 4 using bilateral IMAs and left radial artery .  LIMA TO LAD, RIMA TO PDA, RADIAL ARTERY TO CIRC AND SEQUENTIALLY TO OM1.;  Surgeon: [name], MD;  Location: [place];  Service: Open Heart Surgery;  Laterality: N/A; . CORONARY STENT PLACEMENT  02/27/2014  distal rt/pd coronary       dr Christian [name] . CYSTO/ BLADDER BIOPSY'S/ CAUTHERIZATION  01-14-2004  DR Gaynelle Arabian . EXPLORATION POST OPERATIVE OPEN HEART N/A 08/16/2019  Procedure: Chest Closure S?P CABG WITH APPLICATION OF PREVENA  INCISIONAL WOUND VAC;  Surgeon: [name], MD;  [facility];  Service: Open Heart Surgery;  Laterality: N/A; . EXPLORATION POST OPERATIVE OPEN HEART N/A 08/21/2019  Procedure: CHEST WASHOUT S/P OPEN CHEST;  Surgeon: [name], MD;  Location: [facility];  Service: Open Heart Surgery;  Laterality: N/A;  Open chest with Esmark dressing with Ioban sealant coverage. . EXPLORATION POST OPERATIVE OPEN HEART N/A 08/18/2019  Procedure: EXPLORATION POST OPERATIVE OPEN HEART (performed 04/23 on unit);  Surgeon: [name], MD;  Location: [place];  Service: Open Heart Surgery;  Laterality: N/A; . EXPLORATION POST OPERATIVE OPEN HEART N/A 08/24/2019  Procedure: CHEST WASHOUT POST OPERATIVE OPEN HEART;  Surgeon: [name], MD;  Location: [place];  Service: Open Heart Surgery;  Laterality: N/A; . EXPLORATION POST OPERATIVE OPEN HEART N/A 08/29/2019  Procedure: CHEST WOUND WASHOUT POST OPERATIVE OPEN HEART;  Surgeon: [name], MD;  Location: [place];  Service: Open Heart Surgery;  Laterality: N/A; . EXPLORATION POST OPERATIVE OPEN HEART N/A 09/04/2019  Procedure:  MEDIASTINAL EXPLORATION WITH STERNAL WOUND IRRIGATION;  Surgeon: Grace Tyshon, MD;  Location: [place];  Service: Open Heart Surgery;  Laterality: N/A; . EXPLORATION POST OPERATIVE OPEN HEART N/A 09/14/2019  Procedure: EVACUATION OF HEMATOMA;  Surgeon: Grace Florian, MD;  Location: [place];  Service: Open Heart Surgery;  Laterality: N/A; . IR FLUORO GUIDE CV LINE LEFT  09/19/2019 . IR PATIENT EVAL TECH 0-60 MINS  09/29/2019 . IR US GUIDE VASC ACCESS LEFT  09/19/2019 . LAPAROSCOPIC LYSIS OF ADHESIONS N/A 09/06/2019  Procedure: LAPAROSCOPIC OMENTAL HARVEST;  Surgeon: Kinsinger, Arta Bruce, MD;  Location: [facility];  Service: General;  Laterality: N/A; .  LEFT HEART CATH AND CORONARY ANGIOGRAPHY N/A 08/10/2019  Procedure: LEFT HEART CATH AND CORONARY ANGIOGRAPHY;  Surgeon: Nigel Mormon, MD;  Location: [place] CV LAB;  Service: Cardiovascular;  Laterality: N/A; . LEFT HEART CATHETERIZATION WITH CORONARY ANGIOGRAM N/A 02/27/2014  Procedure: LEFT HEART CATHETERIZATION WITH CORONARY ANGIOGRAM;  Surgeon: Laverda Page, MD;  Location: [facility] CATH LAB;  Service: Cardiovascular;  Laterality: N/A; . MEDIASTINAL EXPLORATION N/A 09/06/2019  Procedure: MEDIASTINAL EXPLORATION;  Surgeon: Grace Meldon, MD;  Location: [place];  Service: Thoracic;  Laterality: N/A; . PECTORALIS FLAP  09/06/2019  Procedure: Pectoralis ADVANCEMENT Flap;  Surgeon: Wallace Going, DO;  Location: [place];  Service: Plastics;; . PERCUTANEOUS CORONARY STENT INTERVENTION (PCI-S)  02/27/2014  Procedure: PERCUTANEOUS CORONARY STENT INTERVENTION (PCI-S);  Surgeon: Laverda Page, MD;  Location: [facility] CATH LAB;  Service: Cardiovascular;;  rt PDA  3.0/28mm Promus stent . RADIAL ARTERY HARVEST Left 08/15/2019  Procedure: Radial Artery Harvest;  Surgeon: [name], MD;  Location: [place];  Service: Open Heart Surgery;  Laterality: Left; . RIB PLATING N/A 09/06/2019  Procedure: STERNAL PLATING;  Surgeon: Grace Alessandro, MD;  Location: [place];   Service: Thoracic;  Laterality: N/A; . TEE WITHOUT CARDIOVERSION N/A 08/15/2019  Procedure: TRANSESOPHAGEAL ECHOCARDIOGRAM (TEE);  Surgeon: [name], MD;  Location: [place];  Service: Open Heart Surgery;  Laterality: N/A; . TRACHEOSTOMY TUBE PLACEMENT  08/29/2019  Procedure: Tracheostomy;  Surgeon: [name], MD;  Location: [place] [facility];  Service: Open Heart Surgery;; . TRANSURETHRAL RESECTION OF PROSTATE  04/04/2012  Procedure: TRANSURETHRAL RESECTION OF THE PROSTATE WITH GYRUS INSTRUMENTS;  Surgeon: Ailene Rud, MD;  Location: [place];  Service: Urology;  Laterality: N/A; . TRANSURETHRAL RESECTION OF PROSTATE N/A 09/27/2014  Procedure: TRANSURETHRAL RESECTION OF THE PROSTATE ;  Surgeon: Carolan Clines, MD;  [facility];  Service: Urology;  Laterality: N/A; . UPPER GASTROINTESTINAL ENDOSCOPY   HPI: 74 yo admitted with NSTEMI 4/15, RLL PE, 4/20 CABG x 4 remained open with wound VAC with repeated washouts. 4/23 cardiac arrest, 4/29 initiated CRRT, vent s/p trach. 5/10 additional sternal washout unable to close.  5/12 sternal plating with pectoralis flap and omental harvest to mediastinum off CRRT with iHD started. Chest wound closed. Neurology consulted for left sided weakness- CTH neg 09/08/19. 5/20 pt with emergent hematoma evacuation and transition to trach collar; 5/28 downsized to #4 cuffless. PMHx: BPH, CAD, DM, HLD, HTN  Subjective: Awake, alert Assessment / Plan / Recommendation CHL IP CLINICAL IMPRESSIONS 09/30/2019 Clinical Impression Pt presents with mild oropharyngeal dysphagia which resulted in decreased lingual coordination, premature spillage, and penetration of thin liquid.  Although intial trials appeared very good, there was penetration with later trials, which may have been due in part to fatigue.  Pt's positioning was very poor preventing visualization of laryngeal vestibule and vocal folds.  It was unclear if penetration proceeded to aspiration or if it was  cleared during the swallow.  Pt was repositioned during the study to attempt to improve visibility without success. An oral diet cannot be recommended based on this study; however, it is not clear that PO intake is unsafe either.  Recommend further evaluation by FEES to improve visualization of laryngeal vestibule prior to proceeding wiht PEG placement. SLP Visit Diagnosis Dysphagia, oropharyngeal phase (R13.12) Attention and concentration deficit following -- Frontal lobe and executive function deficit following -- Impact on safety and function Mild aspiration risk   CHL IP TREATMENT RECOMMENDATION 09/30/2019 Treatment Recommendations F/U  MBS in --- days (Comment)   Prognosis 09/26/2019 Prognosis for Safe Diet Advancement Good Barriers to Reach Goals -- Barriers/Prognosis Comment -- CHL IP DIET RECOMMENDATION 09/30/2019 SLP Diet Recommendations NPO Liquid Administration via -- Medication Administration -- Compensations -- Postural Changes --   No flowsheet data found.  CHL IP FOLLOW UP RECOMMENDATIONS 09/26/2019 Follow up Recommendations 24 hour supervision/assistance   CHL IP FREQUENCY AND DURATION 09/26/2019 Speech Therapy Frequency (ACUTE ONLY) min 2x/week Treatment Duration 2 weeks      CHL IP ORAL PHASE 09/30/2019 Oral Phase Impaired Oral - Pudding Teaspoon -- Oral - Pudding Cup -- Oral - Honey Teaspoon -- Oral - Honey Cup -- Oral - Nectar Teaspoon -- Oral - Nectar Cup -- Oral - Nectar Straw -- Oral - Thin Teaspoon -- Oral - Thin Cup Premature spillage;Lingual/palatal residue Oral - Thin Straw Premature spillage;Lingual/palatal residue Oral - Puree Lingual/palatal residue;Premature spillage;Lingual pumping;Delayed oral transit Oral - Mech Soft -- Oral - Regular Lingual pumping;Lingual/palatal residue;Delayed oral transit Oral - Multi-Consistency -- Oral - Pill -- Oral Phase - Comment --  CHL IP PHARYNGEAL PHASE 09/30/2019 Pharyngeal Phase Impaired Pharyngeal- Pudding Teaspoon -- Pharyngeal -- Pharyngeal- Pudding Cup --  Pharyngeal -- Pharyngeal- Honey Teaspoon -- Pharyngeal -- Pharyngeal- Honey Cup -- Pharyngeal -- Pharyngeal- Nectar Teaspoon -- Pharyngeal -- Pharyngeal- Nectar Cup -- Pharyngeal -- Pharyngeal- Nectar Straw -- Pharyngeal -- Pharyngeal- Thin Teaspoon -- Pharyngeal -- Pharyngeal- Thin Cup Delayed swallow initiation-pyriform sinuses;Reduced epiglottic inversion;Reduced tongue base retraction Pharyngeal Material does not enter airway Pharyngeal- Thin Straw Delayed swallow initiation-pyriform sinuses;Reduced epiglottic inversion;Reduced airway/laryngeal closure;Reduced laryngeal elevation;Reduced anterior laryngeal mobility;Penetration/Aspiration before swallow;Penetration/Aspiration during swallow;Pharyngeal residue - valleculae Pharyngeal Material enters airway, remains ABOVE vocal cords and not ejected out;Material enters airway, remains ABOVE vocal cords then ejected out Pharyngeal- Puree Delayed swallow initiation-vallecula;Reduced tongue base retraction;Pharyngeal residue - valleculae Pharyngeal Material does not enter airway Pharyngeal- Mechanical Soft -- Pharyngeal -- Pharyngeal- Regular Delayed swallow initiation-vallecula;Reduced tongue base retraction;Pharyngeal residue - valleculae Pharyngeal Material does not enter airway Pharyngeal- Multi-consistency -- Pharyngeal -- Pharyngeal- Pill -- Pharyngeal -- Pharyngeal Comment --  No flowsheet data found. Celedonio Savage, MA, CCC-SLP Acute Rehabilitation Services Office: [phone] 09/30/2019, 5:14 PM              IR PATIENT EVAL TECH 0-60 MINS  Result Date: 09/29/2019 Karenann Cai     09/29/2019 11:15 AM IR notified of patient with ongoing bleeding from his catheter site.  Patient brought to IR. Held pressure for 10 minutes with no oozing noted.  Injected trach with gel foam and held pressure for an additional 10 minutes.  No oozing noted after observation for 5 minutes.  Dressed catheter.  G-tube consult to follow.  Brynda Greathouse, MS RD PA-C 10:50  AM     Discharge Medications: Allergies as of 10/18/2019   No Known Allergies     Medication List    STOP taking these medications   cloNIDine 0.2 MG tablet Commonly known as: CATAPRES   furosemide 20 MG tablet Commonly known as: LASIX   isosorbide mononitrate 60 MG 24 hr tablet Commonly known as: IMDUR   metoprolol succinate 100 MG 24 hr tablet Commonly known as: TOPROL-XL   nitroGLYCERIN 0.4 MG SL tablet Commonly known as: NITROSTAT   olmesartan-hydrochlorothiazide 40-25 MG tablet Commonly known as: BENICAR HCT   spironolactone 25 MG tablet Commonly known as: ALDACTONE   sucralfate 1 GM/10ML suspension Commonly known as: Carafate   verapamil 240 MG 24 hr capsule Commonly known as:  VERELAN PM     TAKE these medications   acetaminophen 325 MG tablet Commonly known as: TYLENOL Take 2 tablets (650 mg total) by mouth every 6 (six) hours as needed for mild pain or fever.   amiodarone 200 MG tablet Commonly known as: PACERONE Take 1 tablet (200 mg total) by mouth daily.   aspirin EC 81 MG tablet Take 81 mg by mouth daily.   atorvastatin 20 MG tablet Commonly known as: LIPITOR TAKE 1 TABLET BY MOUTH EVERY DAY IN THE EVENING What changed:   how much to take  how to take this  when to take this  additional instructions   clonazePAM 0.5 MG disintegrating tablet Commonly known as: KLONOPIN Take 1 tablet (0.5 mg total) by mouth 2 (two) times daily.   cromolyn 4 % ophthalmic solution Commonly known as: OPTICROM Place 1 drop into both eyes 4 (four) times daily as needed (dry eyes).   Darbepoetin Alfa 100 MCG/0.5ML Sosy injection Commonly known as: ARANESP Inject 0.5 mLs (100 mcg total) into the vein every Tuesday with hemodialysis.   feeding supplement (NEPRO CARB STEADY) Liqd Place 720 mLs into feeding tube daily.   feeding supplement (NEPRO CARB STEADY) Liqd Place 480 mLs into feeding tube daily.   feeding supplement (PRO-STAT SUGAR FREE 64)  Liqd Place 60 mLs into feeding tube 2 (two) times daily.   Gerhardt's butt cream Crea Apply 1 application topically 4 (four) times daily as needed for irritation.   Jardiance 10 MG Tabs tablet Generic drug: empagliflozin Take 10 mg by mouth daily before breakfast.   metFORMIN 1000 MG tablet Commonly known as: GLUCOPHAGE TAKE 1 TABLET TWICE A DAY WITH A MEAL What changed: See the new instructions.   multivitamin Tabs tablet Take 1 tablet by mouth at bedtime.   oxyCODONE 5 MG immediate release tablet Commonly known as: Oxy IR/ROXICODONE Take 1 tablet (5 mg total) by mouth every 6 (six) hours as needed for severe pain.   PreviDent 5000 Booster Plus 1.1 % Pste Generic drug: Sodium Fluoride Take 1 application by mouth daily.   sertraline 25 MG tablet Commonly known as: ZOLOFT Take 1 tablet (25 mg total) by mouth daily.   sevelamer carbonate 0.8 g Pack packet Commonly known as: RENVELA Take 1.6 g by mouth 3 (three) times daily with meals.   silodosin 8 MG Caps capsule Commonly known as: RAPAFLO Take 8 mg by mouth at bedtime.   tamsulosin 0.4 MG Caps capsule Commonly known as: FLOMAX Take 0.4 mg by mouth daily after breakfast.   Trospium Chloride 60 MG Cp24 Take 1 capsule by mouth daily.      The patient has been discharged on:   1.Beta Blocker:  Yes [   ]                              No   [ X  ]                              If No, reason: labile BP  2.Ace Inhibitor/ARB: Yes [   ]                                     No  [  X  ]  If No, reason: ESRD 3.Statin:   Yes [ X  ]                  No  [   ]                  If No, reason:  4.Ecasa:  Yes  Valu.Nieves   ]                  No   [   ]                  If No, reason:   Signed: Stacey Sago BarrettPA-C 10/18/2019, 8:20 AM

## 2019-10-18 NOTE — Telephone Encounter (Signed)
Pt was on TCM report admitted 08/09/19 for further treatment of chest pain and dyspnea over the past 2 weeks. Patient had a  normal sinus rhythm but has diffuse nonspecific ST-T wave changes.  His troponins are significantly elevated here.  He was started on heparin. CT obtained showed small right lower lobe pulmonary embolism. Dr. Einar Gip was consulted as he is patient's cardiologist. Pt underwent several surgery's Left Heart Cath and coronary Angiography. 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 Pt transferred to [facility]. Pt was d/c 10/18/19 to SNF. Per summary will need to follow-up w/specialist once he has been d/c from skilled nursing.Marland KitchenJohny Chess

## 2019-10-18 NOTE — Care Management Important Message (Signed)
Important Message  Patient Details  Name: Christian [name] MRN: [id number] Date of Birth: 10/24/1945   Medicare Important Message Given:  Yes   Patient left prior to IM delivery.  IM mailed to the patient address.  Vencent Hauschild 10/18/2019, 1:36 PM

## 2019-10-18 NOTE — Progress Notes (Signed)
Christian [name] Progress Note   Assessment/ Plan:   1.  Acute kidney injury status post cardiac catheterization, CABG, cardiogenic shock and cardiac arrest.  Started CRRT 08/24/2019 until 09/06/2019 for volume control.  Now tolerating intermittent dialysis. Continues to be anuric and I expect him to have a low likelihood of renal recovery.  TTS schedule while here in recliner.  Accepted to [facility] on d/c.  Has a TDC--> will need permanent access as OP.  2.  PEG site pain: abdomen is soft- would recommend imaging at discretion of primary team to make sure it's in the right place given his discomfort.  3.  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  MBD-continues Renvela  5.  HTN/VOL-continue ultrafiltration for volume control.  Appears to be tolerating well with dialysis.   Needs more EDW probing as OP  6.  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  Most recently went back to the OR on 5/12 and 09/14/2019.  Continues on aspirin and atorvastatin  7. Atrial fibrillation per primary service  8.  Dispo: d/c today  Subjective:    Tolerated HD yesterday.  For d/c today   Objective:   BP 137/79 (BP Location: Right Arm)   Pulse (!) 103   Temp 97.7 F (36.5 C) (Oral)   Resp 19   Ht 5\' 9"  (1.753 m)   Wt 86.1 kg   SpO2 100%   BMI 28.03 kg/m   Physical Exam: Gen: lying in bed, appears uncomfortable CVS: tachy Resp: clear today Abd: soft, PEG in place with some tenderness, slight dried blood around button, lower abd dressings intact Ext: 1+ LE edema ACCESS: R [facility]  Labs: BMET Recent Labs  Lab 10/12/19 0248 10/13/19 0227 10/14/19 0237 10/15/19 1433 10/16/19 0232 10/17/19 0254 10/18/19 0046  NA 131* 134* 129* 131* 131* 131* 132*  K 3.9 3.7 4.0 3.2* 3.9 3.9 3.9  CL 93* 96* 92* 96* 94* 95* 96*  CO2 24 26 24 23 24  21* 23  GLUCOSE 161* 141* 153* 123* 84 187* 175*  BUN  95* 50* 85* 58* 65* 83* 37*  CREATININE 5.66* 3.77* 5.48* 4.63* 5.27* 6.48* 3.55*  CALCIUM 9.4 9.4 9.2 9.4 9.2 9.0 9.1  PHOS 5.4* 3.8 4.7* 4.8* 5.7* 6.7* 3.3   CBC Recent Labs  Lab 10/12/19 0739 10/14/19 0727 10/17/19 0723  WBC 14.6* 14.2* 12.3*  HGB 9.3* 9.2* 9.2*  HCT 30.3* 30.2* 29.1*  MCV 101.0* 101.3* 98.3  PLT 369 377 346      Medications:    .  stroke: mapping our early stages of recovery book   Does not apply Once  . sodium chloride   Intravenous Once  . amiodarone  200 mg Oral Daily  . arformoterol  15 mcg Nebulization BID  . aspirin  81 mg Oral Daily  . atorvastatin  40 mg Oral Daily  . bisacodyl  10 mg Rectal Daily  . chlorhexidine  15 mL Mouth Rinse BID  . Chlorhexidine Gluconate Cloth  6 each Topical Q0600  . clonazePAM  0.5 mg Oral BID  . darbepoetin (ARANESP) injection - DIALYSIS  100 mcg Intravenous Q Tue-HD  . feeding supplement (NEPRO CARB STEADY)  480 mL Per Tube Q24H  . feeding supplement (PRO-STAT SUGAR FREE 64)  60 mL Per Tube BID  . insulin aspart  0-15 Units Subcutaneous Q4H  . insulin detemir  12 Units Subcutaneous BID  . mouth  rinse  15 mL Mouth Rinse q12n4p  . multivitamin  1 tablet Oral QHS  . nystatin  5 mL Oral QID  . pantoprazole  40 mg Oral Daily  . revefenacin  175 mcg Nebulization Daily  . sertraline  25 mg Oral Daily  . sevelamer carbonate  1.6 g Oral TID WC  . trospium  20 mg Oral BID     Madelon Lips, MD 10/18/2019, 11:16 AM

## 2019-10-18 NOTE — Progress Notes (Signed)
Renal Navigator met with patient and wife as patient was preparing to discharge to SNF.  Patient appeared to be in good spirits. Patient's wife reports that she is clear about the plan other than one thing, which is that the SNF called her to state that she needs to schedule patient's [facility] transportation to OP HD. Navigator has offered to make these arrangements for her. She is very Patent attorney. She requests a Personal Care Attendant be added to patient's account. Patient's transportation is already scheduled for tomorrow through [place] and Navigator made [facility] reservations for 6/26, 629 and 7/1 in addition. Navigator is unable to make a standing order at this time because they are made for 6 months at a time and it is not planned for patient to remain at [facility] longterm at this point. Navigator left a message for [facility] Eligibility Coordinator/Courtney to discuss updating patient's application to include a PCA.  Navigator will update patient's wife later today once time has allowed for patient to get settled at [facility]. Navigator sent message to Education officer, museum at [employer] regarding patient's transportation to OP HD.  Alphonzo Cruise, [place] Renal Navigator [phone]

## 2019-10-18 NOTE — TOC Transition Note (Addendum)
Transition of Care [facility]) - CM/SW Discharge Note   Patient Details  Name: NYZIR DUBOIS MRN: [id number] Date of Birth: April 30, 1945  [facility]) CM/SW Contact:  Benard Halsted, LCSW Phone Number: 10/18/2019, 10:21 AM   Clinical Narrative:    Patient will DC to: [facility] Anticipated DC date: 10/18/19 Family notified: Spouse, Pamala Hurry Transport by: Jonita Albee   Per MD patient ready for DC to [facility]. RN, patient, patient's family, and facility notified of DC. Discharge Summary and FL2 sent to facility. RN to call report prior to discharge [phone] Room 101b). DC packet on chart. Ambulance transport requested for patient.   CSW will sign off for now as social work intervention is no longer needed. Please consult [place] again if new needs arise.      Final next level of care: Skilled Nursing Facility Barriers to Discharge: Barriers Resolved   Patient Goals and CMS Choice Patient states their goals for this hospitalization and ongoing recovery are:: Rehab CMS Medicare.gov Compare Post Acute Care list provided to:: Patient Represenative (must comment) (Spouse Pamala Hurry) Choice offered to / list presented to : Spouse, Patient  Discharge Placement   Existing PASRR number confirmed : 10/18/19          Patient chooses bed at: [facility] Patient to be transferred to facility by: [facility] Name of family member notified: Pamala Hurry Patient and family notified of of transfer: 10/18/19  Discharge Plan and Services In-house Referral: Clinical Social Work   Post Acute Care Choice: [facility]                               Social Determinants of Health (SDOH) Interventions     Readmission Risk Interventions No flowsheet data found.

## 2019-10-19 ENCOUNTER — Telehealth: Payer: Self-pay

## 2019-10-19 DIAGNOSIS — N2581 Secondary hyperparathyroidism of renal origin: Secondary | ICD-10-CM | POA: Diagnosis not present

## 2019-10-19 DIAGNOSIS — Z992 Dependence on renal dialysis: Secondary | ICD-10-CM | POA: Diagnosis not present

## 2019-10-19 DIAGNOSIS — N179 Acute kidney failure, unspecified: Secondary | ICD-10-CM | POA: Diagnosis not present

## 2019-10-19 DIAGNOSIS — D509 Iron deficiency anemia, unspecified: Secondary | ICD-10-CM | POA: Diagnosis not present

## 2019-10-19 DIAGNOSIS — N178 Other acute kidney failure: Secondary | ICD-10-CM | POA: Diagnosis not present

## 2019-10-19 DIAGNOSIS — T8249XA Other complication of vascular dialysis catheter, initial encounter: Secondary | ICD-10-CM | POA: Diagnosis not present

## 2019-10-19 NOTE — Progress Notes (Signed)
Renal Navigator called patient's wife and left message regarding patient's transportation to/from OP HD via [facility]. Message stated that patient is set up with rides through Thursday 10/26/19 and that she will need to call prior to 10/28/19 to arrange rides one week at a time until patient returns home at which time she can arrange a standing order for OP HD transportation. [facility] will only arrange a standing order 6 months at a time and we expect patient's address to change within this time frame. Navigator also informed patient's wife on the message that Navigator has requested that patient's application be updated to include her as his [place] so that she can ride with him for free, but that Navigator has not heard back about this yet. Navigator requested that patient's wife return Navigator's call to confirm.  Alphonzo Cruise, [place] Renal Navigator [phone]

## 2019-10-20 DIAGNOSIS — T8130XA Disruption of wound, unspecified, initial encounter: Secondary | ICD-10-CM | POA: Diagnosis not present

## 2019-10-20 DIAGNOSIS — S51802A Unspecified open wound of left forearm, initial encounter: Secondary | ICD-10-CM | POA: Diagnosis not present

## 2019-10-21 DIAGNOSIS — N2581 Secondary hyperparathyroidism of renal origin: Secondary | ICD-10-CM | POA: Diagnosis not present

## 2019-10-21 DIAGNOSIS — Z992 Dependence on renal dialysis: Secondary | ICD-10-CM | POA: Diagnosis not present

## 2019-10-21 DIAGNOSIS — T8249XA Other complication of vascular dialysis catheter, initial encounter: Secondary | ICD-10-CM | POA: Diagnosis not present

## 2019-10-21 DIAGNOSIS — D509 Iron deficiency anemia, unspecified: Secondary | ICD-10-CM | POA: Diagnosis not present

## 2019-10-21 DIAGNOSIS — N178 Other acute kidney failure: Secondary | ICD-10-CM | POA: Diagnosis not present

## 2019-10-24 DIAGNOSIS — D509 Iron deficiency anemia, unspecified: Secondary | ICD-10-CM | POA: Diagnosis not present

## 2019-10-24 DIAGNOSIS — N178 Other acute kidney failure: Secondary | ICD-10-CM | POA: Diagnosis not present

## 2019-10-24 DIAGNOSIS — N2581 Secondary hyperparathyroidism of renal origin: Secondary | ICD-10-CM | POA: Diagnosis not present

## 2019-10-24 DIAGNOSIS — Z992 Dependence on renal dialysis: Secondary | ICD-10-CM | POA: Diagnosis not present

## 2019-10-24 DIAGNOSIS — T8249XA Other complication of vascular dialysis catheter, initial encounter: Secondary | ICD-10-CM | POA: Diagnosis not present

## 2019-10-26 DIAGNOSIS — D509 Iron deficiency anemia, unspecified: Secondary | ICD-10-CM | POA: Diagnosis not present

## 2019-10-26 DIAGNOSIS — E1140 Type 2 diabetes mellitus with diabetic neuropathy, unspecified: Secondary | ICD-10-CM | POA: Diagnosis not present

## 2019-10-26 DIAGNOSIS — T8249XA Other complication of vascular dialysis catheter, initial encounter: Secondary | ICD-10-CM | POA: Diagnosis not present

## 2019-10-26 DIAGNOSIS — N178 Other acute kidney failure: Secondary | ICD-10-CM | POA: Diagnosis not present

## 2019-10-26 DIAGNOSIS — N2581 Secondary hyperparathyroidism of renal origin: Secondary | ICD-10-CM | POA: Diagnosis not present

## 2019-10-26 DIAGNOSIS — Z992 Dependence on renal dialysis: Secondary | ICD-10-CM | POA: Diagnosis not present

## 2019-10-26 DIAGNOSIS — E1129 Type 2 diabetes mellitus with other diabetic kidney complication: Secondary | ICD-10-CM | POA: Diagnosis not present

## 2019-10-27 DIAGNOSIS — E46 Unspecified protein-calorie malnutrition: Secondary | ICD-10-CM | POA: Insufficient documentation

## 2019-10-27 DIAGNOSIS — R079 Chest pain, unspecified: Secondary | ICD-10-CM | POA: Diagnosis not present

## 2019-10-27 DIAGNOSIS — I2510 Atherosclerotic heart disease of native coronary artery without angina pectoris: Secondary | ICD-10-CM | POA: Diagnosis not present

## 2019-10-27 DIAGNOSIS — K219 Gastro-esophageal reflux disease without esophagitis: Secondary | ICD-10-CM | POA: Diagnosis not present

## 2019-10-27 DIAGNOSIS — Z955 Presence of coronary angioplasty implant and graft: Secondary | ICD-10-CM | POA: Diagnosis not present

## 2019-10-27 DIAGNOSIS — E440 Moderate protein-calorie malnutrition: Secondary | ICD-10-CM | POA: Insufficient documentation

## 2019-10-27 DIAGNOSIS — Z951 Presence of aortocoronary bypass graft: Secondary | ICD-10-CM | POA: Diagnosis not present

## 2019-10-28 DIAGNOSIS — E1140 Type 2 diabetes mellitus with diabetic neuropathy, unspecified: Secondary | ICD-10-CM | POA: Diagnosis not present

## 2019-10-28 DIAGNOSIS — T8249XA Other complication of vascular dialysis catheter, initial encounter: Secondary | ICD-10-CM | POA: Diagnosis not present

## 2019-10-28 DIAGNOSIS — D509 Iron deficiency anemia, unspecified: Secondary | ICD-10-CM | POA: Diagnosis not present

## 2019-10-28 DIAGNOSIS — E1129 Type 2 diabetes mellitus with other diabetic kidney complication: Secondary | ICD-10-CM | POA: Diagnosis not present

## 2019-10-28 DIAGNOSIS — N178 Other acute kidney failure: Secondary | ICD-10-CM | POA: Diagnosis not present

## 2019-10-28 DIAGNOSIS — Z992 Dependence on renal dialysis: Secondary | ICD-10-CM | POA: Diagnosis not present

## 2019-10-28 DIAGNOSIS — N2581 Secondary hyperparathyroidism of renal origin: Secondary | ICD-10-CM | POA: Diagnosis not present

## 2019-10-31 ENCOUNTER — Other Ambulatory Visit: Payer: Self-pay | Admitting: Cardiothoracic Surgery

## 2019-10-31 DIAGNOSIS — Z951 Presence of aortocoronary bypass graft: Secondary | ICD-10-CM

## 2019-10-31 DIAGNOSIS — E1140 Type 2 diabetes mellitus with diabetic neuropathy, unspecified: Secondary | ICD-10-CM | POA: Diagnosis not present

## 2019-10-31 DIAGNOSIS — Z992 Dependence on renal dialysis: Secondary | ICD-10-CM | POA: Diagnosis not present

## 2019-10-31 DIAGNOSIS — N178 Other acute kidney failure: Secondary | ICD-10-CM | POA: Diagnosis not present

## 2019-10-31 DIAGNOSIS — N2581 Secondary hyperparathyroidism of renal origin: Secondary | ICD-10-CM | POA: Diagnosis not present

## 2019-10-31 DIAGNOSIS — D509 Iron deficiency anemia, unspecified: Secondary | ICD-10-CM | POA: Diagnosis not present

## 2019-10-31 DIAGNOSIS — T8249XA Other complication of vascular dialysis catheter, initial encounter: Secondary | ICD-10-CM | POA: Diagnosis not present

## 2019-10-31 DIAGNOSIS — E1129 Type 2 diabetes mellitus with other diabetic kidney complication: Secondary | ICD-10-CM | POA: Diagnosis not present

## 2019-11-01 DIAGNOSIS — R140 Abdominal distension (gaseous): Secondary | ICD-10-CM | POA: Diagnosis not present

## 2019-11-01 DIAGNOSIS — Z931 Gastrostomy status: Secondary | ICD-10-CM | POA: Diagnosis not present

## 2019-11-01 DIAGNOSIS — I69391 Dysphagia following cerebral infarction: Secondary | ICD-10-CM | POA: Diagnosis not present

## 2019-11-01 DIAGNOSIS — R112 Nausea with vomiting, unspecified: Secondary | ICD-10-CM | POA: Diagnosis not present

## 2019-11-02 DIAGNOSIS — D509 Iron deficiency anemia, unspecified: Secondary | ICD-10-CM | POA: Diagnosis not present

## 2019-11-02 DIAGNOSIS — N179 Acute kidney failure, unspecified: Secondary | ICD-10-CM | POA: Diagnosis not present

## 2019-11-02 DIAGNOSIS — N2581 Secondary hyperparathyroidism of renal origin: Secondary | ICD-10-CM | POA: Diagnosis not present

## 2019-11-02 DIAGNOSIS — T8249XA Other complication of vascular dialysis catheter, initial encounter: Secondary | ICD-10-CM | POA: Diagnosis not present

## 2019-11-02 DIAGNOSIS — E1140 Type 2 diabetes mellitus with diabetic neuropathy, unspecified: Secondary | ICD-10-CM | POA: Diagnosis not present

## 2019-11-02 DIAGNOSIS — N178 Other acute kidney failure: Secondary | ICD-10-CM | POA: Diagnosis not present

## 2019-11-02 DIAGNOSIS — E1129 Type 2 diabetes mellitus with other diabetic kidney complication: Secondary | ICD-10-CM | POA: Diagnosis not present

## 2019-11-02 DIAGNOSIS — Z992 Dependence on renal dialysis: Secondary | ICD-10-CM | POA: Diagnosis not present

## 2019-11-04 DIAGNOSIS — N178 Other acute kidney failure: Secondary | ICD-10-CM | POA: Diagnosis not present

## 2019-11-04 DIAGNOSIS — E1140 Type 2 diabetes mellitus with diabetic neuropathy, unspecified: Secondary | ICD-10-CM | POA: Diagnosis not present

## 2019-11-04 DIAGNOSIS — N2581 Secondary hyperparathyroidism of renal origin: Secondary | ICD-10-CM | POA: Diagnosis not present

## 2019-11-04 DIAGNOSIS — Z992 Dependence on renal dialysis: Secondary | ICD-10-CM | POA: Diagnosis not present

## 2019-11-04 DIAGNOSIS — T8249XA Other complication of vascular dialysis catheter, initial encounter: Secondary | ICD-10-CM | POA: Diagnosis not present

## 2019-11-04 DIAGNOSIS — D509 Iron deficiency anemia, unspecified: Secondary | ICD-10-CM | POA: Diagnosis not present

## 2019-11-04 DIAGNOSIS — E1129 Type 2 diabetes mellitus with other diabetic kidney complication: Secondary | ICD-10-CM | POA: Diagnosis not present

## 2019-11-06 ENCOUNTER — Ambulatory Visit (INDEPENDENT_AMBULATORY_CARE_PROVIDER_SITE_OTHER): Payer: Self-pay | Admitting: Cardiothoracic Surgery

## 2019-11-06 ENCOUNTER — Other Ambulatory Visit: Payer: Self-pay

## 2019-11-06 ENCOUNTER — Ambulatory Visit
Admission: RE | Admit: 2019-11-06 | Discharge: 2019-11-06 | Disposition: A | Discharge status: Home / Self Care | Payer: Medicare Other | Source: Ambulatory Visit | Attending: Cardiothoracic Surgery | Admitting: Cardiothoracic Surgery

## 2019-11-06 VITALS — BP 117/75 | HR 108 | Temp 97.5°F | Resp 20 | Ht 67.0 in | Wt 136.0 lb

## 2019-11-06 DIAGNOSIS — J90 Pleural effusion, not elsewhere classified: Secondary | ICD-10-CM | POA: Diagnosis not present

## 2019-11-06 DIAGNOSIS — Z951 Presence of aortocoronary bypass graft: Secondary | ICD-10-CM

## 2019-11-09 DIAGNOSIS — E1129 Type 2 diabetes mellitus with other diabetic kidney complication: Secondary | ICD-10-CM | POA: Diagnosis not present

## 2019-11-09 DIAGNOSIS — Z992 Dependence on renal dialysis: Secondary | ICD-10-CM | POA: Diagnosis not present

## 2019-11-09 DIAGNOSIS — N178 Other acute kidney failure: Secondary | ICD-10-CM | POA: Diagnosis not present

## 2019-11-09 DIAGNOSIS — N2581 Secondary hyperparathyroidism of renal origin: Secondary | ICD-10-CM | POA: Diagnosis not present

## 2019-11-09 DIAGNOSIS — D509 Iron deficiency anemia, unspecified: Secondary | ICD-10-CM | POA: Diagnosis not present

## 2019-11-09 DIAGNOSIS — T8249XA Other complication of vascular dialysis catheter, initial encounter: Secondary | ICD-10-CM | POA: Diagnosis not present

## 2019-11-09 DIAGNOSIS — E1140 Type 2 diabetes mellitus with diabetic neuropathy, unspecified: Secondary | ICD-10-CM | POA: Diagnosis not present

## 2019-11-10 DIAGNOSIS — N178 Other acute kidney failure: Secondary | ICD-10-CM | POA: Diagnosis not present

## 2019-11-10 DIAGNOSIS — E1140 Type 2 diabetes mellitus with diabetic neuropathy, unspecified: Secondary | ICD-10-CM | POA: Diagnosis not present

## 2019-11-10 DIAGNOSIS — N2581 Secondary hyperparathyroidism of renal origin: Secondary | ICD-10-CM | POA: Diagnosis not present

## 2019-11-10 DIAGNOSIS — T8249XA Other complication of vascular dialysis catheter, initial encounter: Secondary | ICD-10-CM | POA: Diagnosis not present

## 2019-11-10 DIAGNOSIS — D509 Iron deficiency anemia, unspecified: Secondary | ICD-10-CM | POA: Diagnosis not present

## 2019-11-10 DIAGNOSIS — E1129 Type 2 diabetes mellitus with other diabetic kidney complication: Secondary | ICD-10-CM | POA: Diagnosis not present

## 2019-11-10 DIAGNOSIS — Z992 Dependence on renal dialysis: Secondary | ICD-10-CM | POA: Diagnosis not present

## 2019-11-11 DIAGNOSIS — N2581 Secondary hyperparathyroidism of renal origin: Secondary | ICD-10-CM | POA: Diagnosis not present

## 2019-11-11 DIAGNOSIS — D509 Iron deficiency anemia, unspecified: Secondary | ICD-10-CM | POA: Diagnosis not present

## 2019-11-11 DIAGNOSIS — E1129 Type 2 diabetes mellitus with other diabetic kidney complication: Secondary | ICD-10-CM | POA: Diagnosis not present

## 2019-11-11 DIAGNOSIS — T8249XA Other complication of vascular dialysis catheter, initial encounter: Secondary | ICD-10-CM | POA: Diagnosis not present

## 2019-11-11 DIAGNOSIS — N178 Other acute kidney failure: Secondary | ICD-10-CM | POA: Diagnosis not present

## 2019-11-11 DIAGNOSIS — Z992 Dependence on renal dialysis: Secondary | ICD-10-CM | POA: Diagnosis not present

## 2019-11-11 DIAGNOSIS — E1140 Type 2 diabetes mellitus with diabetic neuropathy, unspecified: Secondary | ICD-10-CM | POA: Diagnosis not present

## 2019-11-13 DIAGNOSIS — L299 Pruritus, unspecified: Secondary | ICD-10-CM | POA: Diagnosis not present

## 2019-11-13 DIAGNOSIS — I6930 Unspecified sequelae of cerebral infarction: Secondary | ICD-10-CM | POA: Diagnosis not present

## 2019-11-13 DIAGNOSIS — H04123 Dry eye syndrome of bilateral lacrimal glands: Secondary | ICD-10-CM | POA: Diagnosis not present

## 2019-11-13 DIAGNOSIS — R52 Pain, unspecified: Secondary | ICD-10-CM | POA: Diagnosis not present

## 2019-11-13 DIAGNOSIS — L209 Atopic dermatitis, unspecified: Secondary | ICD-10-CM | POA: Diagnosis not present

## 2019-11-14 DIAGNOSIS — N179 Acute kidney failure, unspecified: Secondary | ICD-10-CM | POA: Diagnosis not present

## 2019-11-14 DIAGNOSIS — Z992 Dependence on renal dialysis: Secondary | ICD-10-CM | POA: Diagnosis not present

## 2019-11-14 DIAGNOSIS — T8249XA Other complication of vascular dialysis catheter, initial encounter: Secondary | ICD-10-CM | POA: Diagnosis not present

## 2019-11-14 DIAGNOSIS — N2581 Secondary hyperparathyroidism of renal origin: Secondary | ICD-10-CM | POA: Diagnosis not present

## 2019-11-14 DIAGNOSIS — E1129 Type 2 diabetes mellitus with other diabetic kidney complication: Secondary | ICD-10-CM | POA: Diagnosis not present

## 2019-11-14 DIAGNOSIS — E1140 Type 2 diabetes mellitus with diabetic neuropathy, unspecified: Secondary | ICD-10-CM | POA: Diagnosis not present

## 2019-11-14 DIAGNOSIS — D509 Iron deficiency anemia, unspecified: Secondary | ICD-10-CM | POA: Diagnosis not present

## 2019-11-14 DIAGNOSIS — N178 Other acute kidney failure: Secondary | ICD-10-CM | POA: Diagnosis not present

## 2019-11-14 NOTE — Progress Notes (Signed)
Christian [name] [address]       [place] [id number]             [phone]     CARDIOTHORACIC SURGERY OFFICE NOTE  Referring Provider is Adrian Prows, MD Primary Cardiologist is No primary care provider on file. PCP is Hoyt Koch, MD   HPI:  74 yo man underwent CABG on 4/40/10 complicated by need for open chest, renal failure, and prolonged mechanical ventilation. He was discharged from hosp 2 months later. He now returns for initial outpatient visit. Has been progressing well with PT at home. Appetite and energy level are slowly improving.    Current Outpatient Medications  Medication Sig Dispense Refill  . acetaminophen (TYLENOL) 325 MG tablet Take 2 tablets (650 mg total) by mouth every 6 (six) hours as needed for mild pain or fever.    Marland Kitchen atorvastatin (LIPITOR) 20 MG tablet TAKE 1 TABLET BY MOUTH EVERY DAY IN THE EVENING (Patient taking differently: Take 20 mg by mouth every evening. ) 90 tablet 3  . cloNIDine (CATAPRES) 0.2 MG tablet Take 0.2 mg by mouth daily.    . furosemide (LASIX) 20 MG tablet Take 20 mg by mouth daily.    . metFORMIN (GLUCOPHAGE) 1000 MG tablet TAKE 1 TABLET TWICE A DAY WITH A MEAL (Patient taking differently: Take 1,000 mg by mouth 2 (two) times daily with a meal. ) 180 tablet 1  . Nystatin (GERHARDT'S BUTT CREAM) CREA Apply 1 application topically 4 (four) times daily as needed for irritation.    Marland Kitchen oxyCODONE (OXY IR/ROXICODONE) 5 MG immediate release tablet Take 1 tablet (5 mg total) by mouth every 6 (six) hours as needed for severe pain. 28 tablet 0  . potassium chloride SA (KLOR-CON) 20 MEQ tablet Take 20 mEq by mouth daily.    . silodosin (RAPAFLO) 8 MG CAPS capsule Take 8 mg by mouth at bedtime.    . tamsulosin (FLOMAX) 0.4 MG CAPS capsule Take 0.4 mg by mouth daily after breakfast.     . Trospium Chloride 60 MG CP24 Take 1 capsule by mouth daily.    . Amino Acids-Protein Hydrolys (FEEDING SUPPLEMENT, PRO-STAT SUGAR FREE 64,) LIQD  Place 60 mLs into feeding tube 2 (two) times daily. 887 mL 0  . amiodarone (PACERONE) 200 MG tablet Take 1 tablet (200 mg total) by mouth daily.    Marland Kitchen aspirin EC 81 MG tablet Take 81 mg by mouth daily.    . clonazePAM (KLONOPIN) 0.5 MG disintegrating tablet Take 1 tablet (0.5 mg total) by mouth 2 (two) times daily. 60 tablet 0  . cromolyn (OPTICROM) 4 % ophthalmic solution Place 1 drop into both eyes 4 (four) times daily as needed (dry eyes).  (Patient not taking: Reported on 11/06/2019)  12  . Darbepoetin Alfa (ARANESP) 100 MCG/0.5ML SOSY injection Inject 0.5 mLs (100 mcg total) into the vein every Tuesday with hemodialysis. 4 mL 0  . empagliflozin (JARDIANCE) 10 MG TABS tablet Take 10 mg by mouth daily before breakfast. 30 tablet 1  . multivitamin (RENA-VIT) TABS tablet Take 1 tablet by mouth at bedtime.  0  . Nutritional Supplements (FEEDING SUPPLEMENT, NEPRO CARB STEADY,) LIQD Place 720 mLs into feeding tube daily.  0  . Nutritional Supplements (FEEDING SUPPLEMENT, NEPRO CARB STEADY,) LIQD Place 480 mLs into feeding tube daily.  0  . PREVIDENT 5000 BOOSTER PLUS 1.1 % PSTE Take 1 application by mouth daily.     . sertraline (ZOLOFT) 25  MG tablet Take 1 tablet (25 mg total) by mouth daily.    . sevelamer carbonate (RENVELA) 0.8 g PACK packet Take 1.6 g by mouth 3 (three) times daily with meals. 270 each    No current facility-administered medications for this visit.      Physical Exam:   BP 117/75 (BP Location: Right Arm, Patient Position: Sitting, Cuff Size: Normal)   Pulse (!) 108 Comment: regular  Temp (!) 97.5 F (36.4 C) (Temporal)   Resp 20   Ht 5\' 7"  (1.702 m)   Wt 61.7 kg Comment: on 7/10 at Dialysis per pt. Unable to stand w/o max assist  SpO2 100% Comment: RA  BMI 21.30 kg/m   General:  Chronically ill appearing, NAD  Chest:   cta  CV:   rrr  Incisions:  Well-healed; LUE with small superficial defect, good granulation  Abdomen:  sntnd  Extremities:  Mild edema; muscle  atrophy  Diagnostic Tests:  CXR with clear lung fields   Impression:  Doing better after complicated CABG course  Plan:  F/u as needed Continue PT for strength/mobility Ok to relax sternal precautions  I spent in excess of 20 minutes during the conduct of this office consultation and >50% of this time involved direct face-to-face encounter with the patient for counseling and/or coordination of their care.  Level 2                 10 minutes Level 3                 15 minutes Level 4                 25 minutes Level 5                 40 minutes  B. Murvin Natal, MD 11/14/2019 7:43 AM

## 2019-11-15 DIAGNOSIS — L209 Atopic dermatitis, unspecified: Secondary | ICD-10-CM | POA: Diagnosis not present

## 2019-11-15 DIAGNOSIS — R5381 Other malaise: Secondary | ICD-10-CM | POA: Diagnosis not present

## 2019-11-15 DIAGNOSIS — L299 Pruritus, unspecified: Secondary | ICD-10-CM | POA: Diagnosis not present

## 2019-11-15 DIAGNOSIS — M6281 Muscle weakness (generalized): Secondary | ICD-10-CM | POA: Diagnosis not present

## 2019-11-15 DIAGNOSIS — R262 Difficulty in walking, not elsewhere classified: Secondary | ICD-10-CM | POA: Diagnosis not present

## 2019-11-16 DIAGNOSIS — Z992 Dependence on renal dialysis: Secondary | ICD-10-CM | POA: Diagnosis not present

## 2019-11-16 DIAGNOSIS — D509 Iron deficiency anemia, unspecified: Secondary | ICD-10-CM | POA: Diagnosis not present

## 2019-11-16 DIAGNOSIS — N2581 Secondary hyperparathyroidism of renal origin: Secondary | ICD-10-CM | POA: Diagnosis not present

## 2019-11-16 DIAGNOSIS — E1129 Type 2 diabetes mellitus with other diabetic kidney complication: Secondary | ICD-10-CM | POA: Diagnosis not present

## 2019-11-16 DIAGNOSIS — N178 Other acute kidney failure: Secondary | ICD-10-CM | POA: Diagnosis not present

## 2019-11-16 DIAGNOSIS — T8249XA Other complication of vascular dialysis catheter, initial encounter: Secondary | ICD-10-CM | POA: Diagnosis not present

## 2019-11-16 DIAGNOSIS — L98499 Non-pressure chronic ulcer of skin of other sites with unspecified severity: Secondary | ICD-10-CM | POA: Diagnosis not present

## 2019-11-16 DIAGNOSIS — E1140 Type 2 diabetes mellitus with diabetic neuropathy, unspecified: Secondary | ICD-10-CM | POA: Diagnosis not present

## 2019-11-18 DIAGNOSIS — D509 Iron deficiency anemia, unspecified: Secondary | ICD-10-CM | POA: Diagnosis not present

## 2019-11-18 DIAGNOSIS — T8249XA Other complication of vascular dialysis catheter, initial encounter: Secondary | ICD-10-CM | POA: Diagnosis not present

## 2019-11-18 DIAGNOSIS — N2581 Secondary hyperparathyroidism of renal origin: Secondary | ICD-10-CM | POA: Diagnosis not present

## 2019-11-18 DIAGNOSIS — Z992 Dependence on renal dialysis: Secondary | ICD-10-CM | POA: Diagnosis not present

## 2019-11-18 DIAGNOSIS — E1129 Type 2 diabetes mellitus with other diabetic kidney complication: Secondary | ICD-10-CM | POA: Diagnosis not present

## 2019-11-18 DIAGNOSIS — E1140 Type 2 diabetes mellitus with diabetic neuropathy, unspecified: Secondary | ICD-10-CM | POA: Diagnosis not present

## 2019-11-18 DIAGNOSIS — N178 Other acute kidney failure: Secondary | ICD-10-CM | POA: Diagnosis not present

## 2019-11-20 ENCOUNTER — Ambulatory Visit: Payer: Federal, State, Local not specified - PPO | Admitting: Cardiology

## 2019-11-20 ENCOUNTER — Other Ambulatory Visit: Payer: Self-pay

## 2019-11-20 ENCOUNTER — Encounter: Payer: Self-pay | Admitting: Cardiology

## 2019-11-20 VITALS — BP 149/84 | HR 105 | Resp 17 | Ht 67.0 in | Wt 175.0 lb

## 2019-11-20 DIAGNOSIS — N186 End stage renal disease: Secondary | ICD-10-CM

## 2019-11-20 DIAGNOSIS — Z992 Dependence on renal dialysis: Secondary | ICD-10-CM

## 2019-11-20 DIAGNOSIS — Z951 Presence of aortocoronary bypass graft: Secondary | ICD-10-CM | POA: Diagnosis not present

## 2019-11-20 DIAGNOSIS — I2510 Atherosclerotic heart disease of native coronary artery without angina pectoris: Secondary | ICD-10-CM | POA: Diagnosis not present

## 2019-11-20 DIAGNOSIS — I119 Hypertensive heart disease without heart failure: Secondary | ICD-10-CM | POA: Diagnosis not present

## 2019-11-20 MED ORDER — METOPROLOL SUCCINATE ER 200 MG PO TB24: 200.0000 mg | ORAL_TABLET | Freq: Every day | ORAL | 1 refills | Status: DC

## 2019-11-20 MED ORDER — METOPROLOL SUCCINATE ER 200 MG PO TB24: 100.0000 mg | ORAL_TABLET | Freq: Every day | ORAL | 1 refills | Status: DC

## 2019-11-20 NOTE — Progress Notes (Signed)
Primary Physician/Referring:  Hoyt Koch, MD  Patient ID: Christian [name], male    DOB: 15-Oct-1945, 74 y.o.   MRN: [id number]  Chief Complaint  Patient presents with  . Hypertension  . Chest Pain  . Follow-up   HPI:    Christian [name]  is a 74 y.o. AA male with hypertension, hyperlipidemia, DM with stage 3 CKD,  CAD with  stenting to the PDA on 02/27/2014. Patient presented with unstable angina and NSTEMI on 08/09/2019 along with a very small subsegmental pulmonary embolism and discharged on 10/18/2019 after he underwent CABG x4 on 08/15/2019.  Cholangiography revealing complex ostial LAD stenosis with TIMI II flow and a calcific 75% ostial circumflex stenosis with reduced LVEF.   He had extremely complex postop complications including cardiogenic shock, cardiac arrest, needing internal CPR in the ICU after opening the chest, A. Fib needing cardioversion, closure office chest wall, developed acute renal failure leading to need for permanent dialysis, right brain infarct with left hemiparesis, multiple blood transfusions.  He is now presently in rehab at [facility] care center.  He also has a PEG tube placed.  He now presents for follow-up. He is now presently in rehab at [facility] care center.    Fortunately in spite of all this complications, he has recuperated, has no started to stand on his feet and take a few steps with the help of PT.  He is having dialysis 3 times a week.  He is significantly deconditioned and generally feels weak and also lack of appetite.  Past Medical History:  Diagnosis Date  . BPH (benign prostatic hypertrophy)   . Coronary artery disease   . Diabetes mellitus without complication ([name])   . Dry eyes left  . Hiatal hernia   . 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  LIMA TO LAD, RIMA TO PDA, RADIAL ARTERY TO CIRC AND SEQUENTIALLY TO OM1. 08/15/2019  . Hyperlipidemia   . Hypertension   . Incomplete bladder  emptying   . Nocturia   . 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 SOB (shortness of breath) on exertion    Past Surgical History:  Procedure Laterality Date  . APPLICATION OF WOUND VAC N/A 08/24/2019   Procedure: APPLICATION OF WOUND VAC;  Surgeon: [name], MD;  Location: [place];  Service: Thoracic;  Laterality: N/A;  . APPLICATION OF WOUND VAC  08/29/2019   Procedure: Wound Vac change;  Surgeon: [name], MD;  Location: [place] OR;  Service: Open Heart Surgery;;  . APPLICATION OF WOUND VAC N/A 09/04/2019   Procedure: Application Of Wound Vac;  Surgeon: Grace Kolyn, MD;  Location: [place];  Service: Open Heart Surgery;  Laterality: N/A;  . APPLICATION OF WOUND VAC N/A 09/06/2019   Procedure: APPLICATION OF ACELL, APPLICATION OF WOUND VAC USING PREVENA INCISIONAL  DRESSING;  Surgeon: Wallace Going, DO;  Location: [place];  Service: Plastics;  Laterality: N/A;  . CARDIAC CATHETERIZATION    . CORONARY ARTERY BYPASS GRAFT N/A 08/15/2019   Procedure: CORONARY ARTERY BYPASS GRAFTING (CABG), x 4 using bilateral IMAs and left radial artery .  LIMA TO LAD, RIMA TO PDA, RADIAL ARTERY TO CIRC AND SEQUENTIALLY TO OM1.;  Surgeon: [name], MD;  Location: [place];  Service: Open Heart Surgery;  Laterality: N/A;  . CORONARY  STENT PLACEMENT  02/27/2014   distal rt/pd coronary       dr Einar Gip  . CYSTO/ BLADDER BIOPSY'S/ CAUTHERIZATION  01-14-2004  DR Gaynelle Arabian  . EXPLORATION POST OPERATIVE OPEN HEART N/A 08/16/2019   Procedure: Chest Closure S?P CABG WITH APPLICATION OF PREVENA  INCISIONAL WOUND VAC;  Surgeon: [name], MD;  Location: [facility];  Service: Open Heart Surgery;  Laterality: N/A;  . EXPLORATION POST OPERATIVE OPEN HEART N/A 08/21/2019   Procedure: CHEST WASHOUT S/P OPEN CHEST;  Surgeon: [name], MD;  Location: [place];  Service: Open Heart Surgery;   Laterality: N/A;  Open chest with Esmark dressing with Ioban sealant coverage.  . EXPLORATION POST OPERATIVE OPEN HEART N/A 08/18/2019   Procedure: EXPLORATION POST OPERATIVE OPEN HEART (performed 04/23 on unit);  Surgeon: [name], MD;  Location: [place];  Service: Open Heart Surgery;  Laterality: N/A;  . EXPLORATION POST OPERATIVE OPEN HEART N/A 08/24/2019   Procedure: CHEST WASHOUT POST OPERATIVE OPEN HEART;  Surgeon: [name], MD;  Location: [place];  Service: Open Heart Surgery;  Laterality: N/A;  . EXPLORATION POST OPERATIVE OPEN HEART N/A 08/29/2019   Procedure: CHEST WOUND WASHOUT POST OPERATIVE OPEN HEART;  Surgeon: [name], MD;  Location: [place];  Service: Open Heart Surgery;  Laterality: N/A;  . EXPLORATION POST OPERATIVE OPEN HEART N/A 09/04/2019   Procedure: MEDIASTINAL EXPLORATION WITH STERNAL WOUND IRRIGATION;  Surgeon: Grace Kennen, MD;  Location: [place];  Service: Open Heart Surgery;  Laterality: N/A;  . EXPLORATION POST OPERATIVE OPEN HEART N/A 09/14/2019   Procedure: EVACUATION OF HEMATOMA;  Surgeon: Grace Gage, MD;  Location: [place];  Service: Open Heart Surgery;  Laterality: N/A;  . IR FLUORO GUIDE CV LINE LEFT  09/19/2019  . IR GASTROSTOMY TUBE MOD SED  10/04/2019  . IR PATIENT EVAL TECH 0-60 MINS  09/29/2019  . IR US GUIDE VASC ACCESS LEFT  09/19/2019  . LAPAROSCOPIC LYSIS OF ADHESIONS N/A 09/06/2019   Procedure: LAPAROSCOPIC OMENTAL HARVEST;  Surgeon: Kinsinger, Arta Bruce, MD;  Location: [place];  Service: General;  Laterality: N/A;  . LEFT HEART CATH AND CORONARY ANGIOGRAPHY N/A 08/10/2019   Procedure: LEFT HEART CATH AND CORONARY ANGIOGRAPHY;  Surgeon: Nigel Mormon, MD;  Location: [place] CV LAB;  Service: Cardiovascular;  Laterality: N/A;  . LEFT HEART CATHETERIZATION WITH CORONARY ANGIOGRAM N/A 02/27/2014   Procedure: LEFT HEART CATHETERIZATION WITH CORONARY ANGIOGRAM;  Surgeon: Laverda Page, MD;  Location: [facility] CATH LAB;  Service:  Cardiovascular;  Laterality: N/A;  . MEDIASTINAL EXPLORATION N/A 09/06/2019   Procedure: MEDIASTINAL EXPLORATION;  Surgeon: Grace Ryon, MD;  Location: [place];  Service: Thoracic;  Laterality: N/A;  . PECTORALIS FLAP  09/06/2019   Procedure: Pectoralis ADVANCEMENT Flap;  Surgeon: Wallace Going, DO;  Location: [place];  Service: Plastics;;  . PERCUTANEOUS CORONARY STENT INTERVENTION (PCI-S)  02/27/2014   Procedure: PERCUTANEOUS CORONARY STENT INTERVENTION (PCI-S);  Surgeon: Laverda Page, MD;  Location: [facility] CATH LAB;  Service: Cardiovascular;;  rt PDA  3.0/28mm Promus stent  . RADIAL ARTERY HARVEST Left 08/15/2019   Procedure: Radial Artery Harvest;  Surgeon: [name], MD;  Location: [place];  Service: Open Heart Surgery;  Laterality: Left;  . RIB PLATING N/A 09/06/2019   Procedure: STERNAL PLATING;  Surgeon: Grace Denilson, MD;  Location: [place];  Service: Thoracic;  Laterality: N/A;  . TEE WITHOUT CARDIOVERSION N/A 08/15/2019   Procedure: TRANSESOPHAGEAL ECHOCARDIOGRAM (  TEE);  Surgeon: [name], MD;  Location: [facility] OR;  Service: Open Heart Surgery;  Laterality: N/A;  . TRACHEOSTOMY TUBE PLACEMENT  08/29/2019   Procedure: Tracheostomy;  Surgeon: [name], MD;  Location: [place] OR;  Service: Open Heart Surgery;;  . TRANSURETHRAL RESECTION OF PROSTATE  04/04/2012   Procedure: TRANSURETHRAL RESECTION OF THE PROSTATE WITH GYRUS INSTRUMENTS;  Surgeon: Ailene Rud, MD;  Location: [place];  Service: Urology;  Laterality: N/A;  . TRANSURETHRAL RESECTION OF PROSTATE N/A 09/27/2014   Procedure: TRANSURETHRAL RESECTION OF THE PROSTATE ;  Surgeon: Carolan Clines, MD;  Location: [facility];  Service: Urology;  Laterality: N/A;  . UPPER GASTROINTESTINAL ENDOSCOPY     Social History   Tobacco Use  . Smoking status: Former Smoker    Packs/day: 0.50    Years: 20.00    Pack years: 10.00    Types: Cigarettes    Quit date: 03/28/1990    Years since  quitting: 29.6  . Smokeless tobacco: Never Used  Substance Use Topics  . Alcohol use: Yes    Alcohol/week: 0.0 standard drinks    Comment: 1 beer week rarely   ROS   Review of Systems  Constitutional: Positive for malaise/fatigue.  Cardiovascular: Positive for dyspnea on exertion and leg swelling. Negative for chest pain.  Gastrointestinal: Negative for melena.     Objective  Blood pressure (!) 149/84, pulse 105, resp. rate 17, height 5\' 7"  (1.702 m), weight 175 lb (79.4 kg), SpO2 99 %.  Vitals with BMI 11/20/2019 11/06/2019 10/18/2019  Height 5\' 7"  5\' 7"  -  Weight 175 lbs 136 lbs -  BMI 12.4 58.0 -  Systolic 998 338 250  Diastolic 84 75 82  Pulse 539 108 96    Physical Exam Constitutional:      Comments: He is moderately built and mildly obese in no acute distress.  Eyes:     Conjunctiva/sclera: Conjunctivae normal.  Neck:     Thyroid: No thyromegaly.  Cardiovascular:     Rate and Rhythm: Normal rate and regular rhythm.     Pulses:          Carotid pulses are 2+ on the right side and 2+ on the left side.      Femoral pulses are 2+ on the right side and 2+ on the left side.      Popliteal pulses are 0 on the right side and 0 on the left side.       Dorsalis pedis pulses are 0 on the right side and 0 on the left side.       Posterior tibial pulses are 0 on the right side and 0 on the left side.     Heart sounds: Heart sounds are distant. No murmur heard.  No gallop.      Comments: 2+ ankle edema, no JVD.  Pulmonary:     Effort: Pulmonary effort is normal.     Breath sounds: Normal breath sounds.  Chest:     Comments: Sternotomy scar has healed well Abdominal:     General: Bowel sounds are normal.     Palpations: Abdomen is soft.     Laboratory examination:   Recent Labs    10/16/19 0232 10/17/19 0254 10/18/19 0046  NA 131* 131* 132*  K 3.9 3.9 3.9  CL 94* 95* 96*  CO2 24 21* 23  GLUCOSE 84 187* 175*  BUN 65* 83* 37*  CREATININE 5.27* 6.48* 3.55*  CALCIUM  9.2 9.0  9.1  GFRNONAA 10* 8* 16*  GFRAA 12* 9* 19*   CrCl cannot be calculated (Patient's most recent lab result is older than the maximum 21 days allowed.).  CMP Latest Ref Rng & Units 10/18/2019 10/17/2019 10/16/2019  Glucose 70 - 99 mg/dL 175(H) 187(H) 84  BUN 8 - 23 mg/dL 37(H) 83(H) 65(H)  Creatinine 0.61 - 1.24 mg/dL 3.55(H) 6.48(H) 5.27(H)  Sodium 135 - 145 mmol/L 132(L) 131(L) 131(L)  Potassium 3.5 - 5.1 mmol/L 3.9 3.9 3.9  Chloride 98 - 111 mmol/L 96(L) 95(L) 94(L)  CO2 22 - 32 mmol/L 23 21(L) 24  Calcium 8.9 - 10.3 mg/dL 9.1 9.0 9.2  Total Protein 6.5 - 8.1 g/dL - - -  Total Bilirubin 0.3 - 1.2 mg/dL - - -  Alkaline Phos 38 - 126 U/L - - -  AST 15 - 41 U/L - - -  ALT 0 - 44 U/L - - -   CBC Latest Ref Rng & Units 10/17/2019 10/14/2019 10/12/2019  WBC 4.0 - 10.5 K/uL 12.3(H) 14.2(H) 14.6(H)  Hemoglobin 13.0 - 17.0 g/dL 9.2(L) 9.2(L) 9.3(L)  Hematocrit 39 - 52 % 29.1(L) 30.2(L) 30.3(L)  Platelets 150 - 400 K/uL 346 377 369   Lipid Panel Recent Labs    11/23/18 1120 11/23/18 1120 08/11/19 0426 08/15/19 1958 08/24/19 2003 08/26/19 1028 08/27/19 1611  CHOL 111  --  107  --   --   --   --   TRIG 203.0*   < > 125   < > 117 131 71  LDLCALC  --   --  40  --   --   --   --   VLDL 40.6*  --  25  --   --   --   --   HDL 49.20  --  42  --   --   --   --   CHOLHDL 2  --  2.5  --   --   --   --   LDLDIRECT 40.0  --   --   --   --   --   --    < > = values in this interval not displayed.    HEMOGLOBIN A1C Lab Results  Component Value Date   HGBA1C 7.7 (H) 08/14/2019   MPG 174.29 08/14/2019   TSH Recent Labs    04/19/19 0911  TSH 2.590   Medications and allergies  No Known Allergies   Current Outpatient Medications  Medication Instructions  . acetaminophen (TYLENOL) 650 mg, Oral, Every 6 hours PRN  . Amino Acids-Protein Hydrolys (FEEDING SUPPLEMENT, PRO-STAT SUGAR FREE 64,) LIQD 60 mLs, Per Tube, 2 times daily  . aspirin EC 81 mg, Oral, Daily  . atorvastatin  (LIPITOR) 20 MG tablet TAKE 1 TABLET BY MOUTH EVERY DAY IN THE EVENING  . clonazePAM (KLONOPIN) 0.5 mg, Oral, 2 times daily  . cloNIDine (CATAPRES) 0.2 mg, Oral, Daily  . Darbepoetin Alfa (ARANESP) 100 mcg, Intravenous, Every Tue (Hemodialysis)  . furosemide (LASIX) 20 mg, Oral, Daily  . Jardiance 10 mg, Oral, Daily before breakfast  . metoprolol (TOPROL-XL) 200 mg, Oral, Daily  . multivitamin (RENA-VIT) TABS tablet 1 tablet, Oral, Daily at bedtime  . nitroGLYCERIN (NITROSTAT) 0.4 mg, Sublingual, As directed  . Nutritional Supplements (FEEDING SUPPLEMENT, NEPRO CARB STEADY,) LIQD 720 mLs, Per Tube, Every 24 hours  . Nutritional Supplements (FEEDING SUPPLEMENT, NEPRO CARB STEADY,) LIQD 480 mLs, Per Tube, Every 24 hours  . Nystatin (GERHARDT'S BUTT CREAM) CREA 1 application,  Topical, 4 times daily PRN  . oxyCODONE (OXY IR/ROXICODONE) 5 mg, Oral, Every 6 hours PRN  . PREVIDENT 5000 BOOSTER PLUS 1.1 % PSTE 1 application, Oral, Daily  . senna-docusate (SENNA-PLUS) 8.6-50 MG tablet 1 tablet, Oral, Daily  . sertraline (ZOLOFT) 25 mg, Oral, Daily  . sevelamer carbonate (RENVELA) 1.6 g, Oral, 3 times daily with meals  . silodosin (RAPAFLO) 8 mg, Oral, Daily at bedtime  . tamsulosin (FLOMAX) 0.4 mg, Oral, Daily after breakfast  . Trospium Chloride 60 MG CP24 1 capsule, Oral, Daily  . verapamil (VERELAN PM) 240 MG 24 hr capsule 1 capsule, Oral, Daily   Radiology:  No results found.  Cardiac Studies:   Heart catheterization 25-Aug-2019: LM: Normal LAD: Ostial 95% stenosis Ramus: Ostial/prox 30% stenosis LCx: Ostial 75% stenosis RCA: Prox-mid diffuse 40% calcific disease. Distal RCA 70% ISR of 3.0 by 28 mm Promus placed 02/27/2014.  Pre-CABG Dopplers including ABI and carotid duplex: No significant carotid artery stenosis.  Antegrade vertebral artery flow. ABI normal, with triphasic waveforms at the level of the ankle.  Hx of CABG 08/15/2019: LIMA TO LAD, RIMA TO PDA, SEQUENTIAL RADIAL ARTERY  TO Cx-OM1.  Echocardiogram 08/19/2019:  1. There prominent respiratory displacement of the interventricular  septum, consistent with enhanced ventricular interdependence, likely due  to constrictive physiology.. Left ventricular ejection fraction, by  estimation, is 45 to 50%. The left ventricle  has mildly decreased function. The left ventricle demonstrates global  hypokinesis. Left ventricular diastolic parameters are consistent with  Grade II diastolic dysfunction (pseudonormalization). Elevated left atrial  pressure.  2. Right ventricular systolic function was not well visualized. The right  ventricular size is normal. Tricuspid regurgitation signal is inadequate  for assessing PA pressure.  3. No pericardial fluid is see, but the pericardium is very thick and  thrombus in the pericardial space cannot be ecluded.   Assessment     ICD-10-CM   1. Hypertension with heart disease  I11.9 metoprolol (TOPROL-XL) 200 MG 24 hr tablet    DISCONTINUED: metoprolol succinate (TOPROL-XL) 200 MG 24 hr tablet  2. Atherosclerosis of native coronary artery of native heart without angina pectoris  I25.10 metoprolol (TOPROL-XL) 200 MG 24 hr tablet    DISCONTINUED: metoprolol succinate (TOPROL-XL) 200 MG 24 hr tablet  3. ESRD on hemodialysis (HCC)  N18.6    Z99.2   4. Hx of CABG 08/15/2019: LIMA TO LAD, RIMA TO PDA, SEQUENTIAL RADIAL ARTERY TO Cx-OM1.  Z95.1     EKG 06/01/2019: Normal sinus rhythm at the rate of 98 bpm, leftward enlargement, normal axis.  No evidence of ischemia.   No significant change from  EKG 04/18/2019.    Recommendations:   Meds ordered this encounter  Medications  . DISCONTD: metoprolol succinate (TOPROL-XL) 200 MG 24 hr tablet    Sig: Take 0.5 tablets (100 mg total) by mouth daily.    Dispense:  90 tablet    Refill:  1  . metoprolol (TOPROL-XL) 200 MG 24 hr tablet    Sig: Take 1 tablet (200 mg total) by mouth daily.    Dispense:  90 tablet    Refill:  1     Christian [name]  is a 74 y.o. AA male with hypertension, hyperlipidemia, DM with stage 3 CKD,  CAD with  stenting to the PDA on 02/27/2014. Patient presented with unstable angina and NSTEMI on 08/09/2019 along with a very small subsegmental pulmonary embolism and discharged on 10/18/2019 after he underwent CABG x4 on 08/15/2019.  Cholangiography revealing complex ostial LAD stenosis with TIMI II flow and a calcific 75% ostial circumflex stenosis with reduced LVEF.   He had extremely complex postop complications including cardiogenic shock, cardiac arrest, needing internal CPR in the ICU after opening the chest, A. Fib needing cardioversion, closure office chest wall, developed acute renal failure leading to need for permanent dialysis, right brain infarct with left hemiparesis, multiple blood transfusions.  He is now presently in rehab at [facility] care center.  He also has a PEG tube placed.  He now presents for follow-up.  Fortunately in spite of his complex course, today he is not in acute decompensated heart failure.  He continues to be tachycardic and blood pressure is also elevated.  I have strongly stressed to him regarding his poor eating habits, recently has started eating snacks like chips and to avoid salt and also weight gain.  I have sent in orders for restricting his salt intake, restricting his calories.  He is now able to swallow and able to eat, the PEG tube can certainly be discontinued.  In view of tachycardia and hypertension, cardiomyopathy, coronary disease and recent non-STEMI, will increase his metoprolol succinate from 100 mg to 200 mg daily.  He is also on diltiazem for previous A. fib with RVR and hypertension, I will recheck his EKG on his next office visit in 2 to 3 weeks.  I will also consider starting him back on Plavix in view of ACS.   Adrian Prows, MD, [facility] 11/20/2019, 9:56 PM Office: [phone]

## 2019-11-21 DIAGNOSIS — T8249XA Other complication of vascular dialysis catheter, initial encounter: Secondary | ICD-10-CM | POA: Diagnosis not present

## 2019-11-21 DIAGNOSIS — N179 Acute kidney failure, unspecified: Secondary | ICD-10-CM | POA: Diagnosis not present

## 2019-11-21 DIAGNOSIS — E1140 Type 2 diabetes mellitus with diabetic neuropathy, unspecified: Secondary | ICD-10-CM | POA: Diagnosis not present

## 2019-11-21 DIAGNOSIS — E1129 Type 2 diabetes mellitus with other diabetic kidney complication: Secondary | ICD-10-CM | POA: Diagnosis not present

## 2019-11-21 DIAGNOSIS — D509 Iron deficiency anemia, unspecified: Secondary | ICD-10-CM | POA: Diagnosis not present

## 2019-11-21 DIAGNOSIS — N178 Other acute kidney failure: Secondary | ICD-10-CM | POA: Diagnosis not present

## 2019-11-21 DIAGNOSIS — Z992 Dependence on renal dialysis: Secondary | ICD-10-CM | POA: Diagnosis not present

## 2019-11-21 DIAGNOSIS — N2581 Secondary hyperparathyroidism of renal origin: Secondary | ICD-10-CM | POA: Diagnosis not present

## 2019-11-22 ENCOUNTER — Other Ambulatory Visit (HOSPITAL_COMMUNITY): Payer: Self-pay | Admitting: Family Medicine

## 2019-11-22 DIAGNOSIS — Z431 Encounter for attention to gastrostomy: Secondary | ICD-10-CM

## 2019-11-23 DIAGNOSIS — T8249XA Other complication of vascular dialysis catheter, initial encounter: Secondary | ICD-10-CM | POA: Diagnosis not present

## 2019-11-23 DIAGNOSIS — Z992 Dependence on renal dialysis: Secondary | ICD-10-CM | POA: Diagnosis not present

## 2019-11-23 DIAGNOSIS — E1140 Type 2 diabetes mellitus with diabetic neuropathy, unspecified: Secondary | ICD-10-CM | POA: Diagnosis not present

## 2019-11-23 DIAGNOSIS — D509 Iron deficiency anemia, unspecified: Secondary | ICD-10-CM | POA: Diagnosis not present

## 2019-11-23 DIAGNOSIS — E1129 Type 2 diabetes mellitus with other diabetic kidney complication: Secondary | ICD-10-CM | POA: Diagnosis not present

## 2019-11-23 DIAGNOSIS — N2581 Secondary hyperparathyroidism of renal origin: Secondary | ICD-10-CM | POA: Diagnosis not present

## 2019-11-23 DIAGNOSIS — N179 Acute kidney failure, unspecified: Secondary | ICD-10-CM | POA: Diagnosis not present

## 2019-11-23 DIAGNOSIS — N178 Other acute kidney failure: Secondary | ICD-10-CM | POA: Diagnosis not present

## 2019-11-24 DIAGNOSIS — K219 Gastro-esophageal reflux disease without esophagitis: Secondary | ICD-10-CM | POA: Diagnosis not present

## 2019-11-24 DIAGNOSIS — I6930 Unspecified sequelae of cerebral infarction: Secondary | ICD-10-CM | POA: Diagnosis not present

## 2019-11-24 DIAGNOSIS — L98419 Non-pressure chronic ulcer of buttock with unspecified severity: Secondary | ICD-10-CM | POA: Diagnosis not present

## 2019-11-24 DIAGNOSIS — Z951 Presence of aortocoronary bypass graft: Secondary | ICD-10-CM | POA: Diagnosis not present

## 2019-11-24 DIAGNOSIS — M6281 Muscle weakness (generalized): Secondary | ICD-10-CM | POA: Diagnosis not present

## 2019-11-24 DIAGNOSIS — I2510 Atherosclerotic heart disease of native coronary artery without angina pectoris: Secondary | ICD-10-CM | POA: Diagnosis not present

## 2019-11-25 DIAGNOSIS — D509 Iron deficiency anemia, unspecified: Secondary | ICD-10-CM | POA: Diagnosis not present

## 2019-11-25 DIAGNOSIS — T8249XA Other complication of vascular dialysis catheter, initial encounter: Secondary | ICD-10-CM | POA: Diagnosis not present

## 2019-11-25 DIAGNOSIS — N178 Other acute kidney failure: Secondary | ICD-10-CM | POA: Diagnosis not present

## 2019-11-25 DIAGNOSIS — N2581 Secondary hyperparathyroidism of renal origin: Secondary | ICD-10-CM | POA: Diagnosis not present

## 2019-11-25 DIAGNOSIS — Z992 Dependence on renal dialysis: Secondary | ICD-10-CM | POA: Diagnosis not present

## 2019-11-25 DIAGNOSIS — E1140 Type 2 diabetes mellitus with diabetic neuropathy, unspecified: Secondary | ICD-10-CM | POA: Diagnosis not present

## 2019-11-25 DIAGNOSIS — E1129 Type 2 diabetes mellitus with other diabetic kidney complication: Secondary | ICD-10-CM | POA: Diagnosis not present

## 2019-11-28 DIAGNOSIS — L299 Pruritus, unspecified: Secondary | ICD-10-CM | POA: Diagnosis not present

## 2019-11-28 DIAGNOSIS — N2581 Secondary hyperparathyroidism of renal origin: Secondary | ICD-10-CM | POA: Diagnosis not present

## 2019-11-28 DIAGNOSIS — Z992 Dependence on renal dialysis: Secondary | ICD-10-CM | POA: Diagnosis not present

## 2019-11-28 DIAGNOSIS — N178 Other acute kidney failure: Secondary | ICD-10-CM | POA: Diagnosis not present

## 2019-11-28 DIAGNOSIS — T8249XA Other complication of vascular dialysis catheter, initial encounter: Secondary | ICD-10-CM | POA: Diagnosis not present

## 2019-11-28 HISTORY — DX: Pruritus, unspecified: L29.9

## 2019-11-29 ENCOUNTER — Ambulatory Visit (HOSPITAL_COMMUNITY)
Admission: RE | Admit: 2019-11-29 | Discharge: 2019-11-29 | Disposition: A | Discharge status: Home / Self Care | Payer: Medicare Other | Source: Ambulatory Visit | Attending: Family Medicine | Admitting: Family Medicine

## 2019-11-29 ENCOUNTER — Other Ambulatory Visit: Payer: Self-pay

## 2019-11-29 DIAGNOSIS — Z431 Encounter for attention to gastrostomy: Secondary | ICD-10-CM | POA: Diagnosis not present

## 2019-11-29 DIAGNOSIS — R4702 Dysphasia: Secondary | ICD-10-CM | POA: Diagnosis not present

## 2019-11-29 HISTORY — PX: IR GASTROSTOMY TUBE REMOVAL: IMG5492

## 2019-11-29 MED ORDER — LIDOCAINE VISCOUS HCL 2 % MT SOLN
OROMUCOSAL | Status: AC
  Filled 2019-11-29: qty 15

## 2019-11-29 MED ORDER — LIDOCAINE VISCOUS HCL 2 % MT SOLN
OROMUCOSAL | Status: DC | PRN
  Administered 2019-11-29: 15 mL via OROMUCOSAL

## 2019-11-29 NOTE — Procedures (Signed)
PROCEDURE SUMMARY:  Successful removal of percutaneous (pull-though) gastrostomy tube, removed intact. No immediate complications.  EBL < 2 mL. Patient tolerated well.  Pressure dressing (gauze and tegaderm) applied to site.  Please see imaging section of Epic for full dictation.   Othella Slappey PA-C 11/29/2019 2:10 PM

## 2019-11-30 DIAGNOSIS — N178 Other acute kidney failure: Secondary | ICD-10-CM | POA: Diagnosis not present

## 2019-11-30 DIAGNOSIS — L299 Pruritus, unspecified: Secondary | ICD-10-CM | POA: Diagnosis not present

## 2019-11-30 DIAGNOSIS — N2581 Secondary hyperparathyroidism of renal origin: Secondary | ICD-10-CM | POA: Diagnosis not present

## 2019-11-30 DIAGNOSIS — N179 Acute kidney failure, unspecified: Secondary | ICD-10-CM | POA: Diagnosis not present

## 2019-11-30 DIAGNOSIS — T8249XA Other complication of vascular dialysis catheter, initial encounter: Secondary | ICD-10-CM | POA: Diagnosis not present

## 2019-11-30 DIAGNOSIS — Z992 Dependence on renal dialysis: Secondary | ICD-10-CM | POA: Diagnosis not present

## 2019-12-01 DIAGNOSIS — N186 End stage renal disease: Secondary | ICD-10-CM | POA: Diagnosis not present

## 2019-12-01 DIAGNOSIS — N179 Acute kidney failure, unspecified: Secondary | ICD-10-CM | POA: Diagnosis not present

## 2019-12-01 DIAGNOSIS — L98499 Non-pressure chronic ulcer of skin of other sites with unspecified severity: Secondary | ICD-10-CM | POA: Diagnosis not present

## 2019-12-01 DIAGNOSIS — Z992 Dependence on renal dialysis: Secondary | ICD-10-CM | POA: Diagnosis not present

## 2019-12-01 DIAGNOSIS — L299 Pruritus, unspecified: Secondary | ICD-10-CM | POA: Diagnosis not present

## 2019-12-02 DIAGNOSIS — T8249XA Other complication of vascular dialysis catheter, initial encounter: Secondary | ICD-10-CM | POA: Diagnosis not present

## 2019-12-02 DIAGNOSIS — Z992 Dependence on renal dialysis: Secondary | ICD-10-CM | POA: Diagnosis not present

## 2019-12-02 DIAGNOSIS — L299 Pruritus, unspecified: Secondary | ICD-10-CM | POA: Diagnosis not present

## 2019-12-02 DIAGNOSIS — N2581 Secondary hyperparathyroidism of renal origin: Secondary | ICD-10-CM | POA: Diagnosis not present

## 2019-12-02 DIAGNOSIS — N178 Other acute kidney failure: Secondary | ICD-10-CM | POA: Diagnosis not present

## 2019-12-05 DIAGNOSIS — N178 Other acute kidney failure: Secondary | ICD-10-CM | POA: Diagnosis not present

## 2019-12-05 DIAGNOSIS — T8249XA Other complication of vascular dialysis catheter, initial encounter: Secondary | ICD-10-CM | POA: Diagnosis not present

## 2019-12-05 DIAGNOSIS — L299 Pruritus, unspecified: Secondary | ICD-10-CM | POA: Diagnosis not present

## 2019-12-05 DIAGNOSIS — Z992 Dependence on renal dialysis: Secondary | ICD-10-CM | POA: Diagnosis not present

## 2019-12-05 DIAGNOSIS — N2581 Secondary hyperparathyroidism of renal origin: Secondary | ICD-10-CM | POA: Diagnosis not present

## 2019-12-07 DIAGNOSIS — I2510 Atherosclerotic heart disease of native coronary artery without angina pectoris: Secondary | ICD-10-CM | POA: Diagnosis not present

## 2019-12-07 DIAGNOSIS — L299 Pruritus, unspecified: Secondary | ICD-10-CM | POA: Diagnosis not present

## 2019-12-07 DIAGNOSIS — Z992 Dependence on renal dialysis: Secondary | ICD-10-CM | POA: Diagnosis not present

## 2019-12-07 DIAGNOSIS — E1142 Type 2 diabetes mellitus with diabetic polyneuropathy: Secondary | ICD-10-CM | POA: Diagnosis not present

## 2019-12-07 DIAGNOSIS — N186 End stage renal disease: Secondary | ICD-10-CM | POA: Diagnosis not present

## 2019-12-07 DIAGNOSIS — T8249XA Other complication of vascular dialysis catheter, initial encounter: Secondary | ICD-10-CM | POA: Diagnosis not present

## 2019-12-07 DIAGNOSIS — N179 Acute kidney failure, unspecified: Secondary | ICD-10-CM | POA: Diagnosis not present

## 2019-12-07 DIAGNOSIS — N2581 Secondary hyperparathyroidism of renal origin: Secondary | ICD-10-CM | POA: Diagnosis not present

## 2019-12-07 DIAGNOSIS — D631 Anemia in chronic kidney disease: Secondary | ICD-10-CM | POA: Diagnosis not present

## 2019-12-09 DIAGNOSIS — N2581 Secondary hyperparathyroidism of renal origin: Secondary | ICD-10-CM | POA: Diagnosis not present

## 2019-12-09 DIAGNOSIS — N186 End stage renal disease: Secondary | ICD-10-CM | POA: Diagnosis not present

## 2019-12-09 DIAGNOSIS — T8249XA Other complication of vascular dialysis catheter, initial encounter: Secondary | ICD-10-CM | POA: Diagnosis not present

## 2019-12-09 DIAGNOSIS — L299 Pruritus, unspecified: Secondary | ICD-10-CM | POA: Diagnosis not present

## 2019-12-09 DIAGNOSIS — Z992 Dependence on renal dialysis: Secondary | ICD-10-CM | POA: Diagnosis not present

## 2019-12-10 DIAGNOSIS — I4891 Unspecified atrial fibrillation: Secondary | ICD-10-CM | POA: Diagnosis not present

## 2019-12-10 DIAGNOSIS — E1142 Type 2 diabetes mellitus with diabetic polyneuropathy: Secondary | ICD-10-CM | POA: Diagnosis not present

## 2019-12-10 DIAGNOSIS — Z951 Presence of aortocoronary bypass graft: Secondary | ICD-10-CM | POA: Diagnosis not present

## 2019-12-10 DIAGNOSIS — I132 Hypertensive heart and chronic kidney disease with heart failure and with stage 5 chronic kidney disease, or end stage renal disease: Secondary | ICD-10-CM | POA: Diagnosis not present

## 2019-12-10 DIAGNOSIS — I509 Heart failure, unspecified: Secondary | ICD-10-CM | POA: Diagnosis not present

## 2019-12-10 DIAGNOSIS — I2510 Atherosclerotic heart disease of native coronary artery without angina pectoris: Secondary | ICD-10-CM | POA: Diagnosis not present

## 2019-12-10 DIAGNOSIS — Z48812 Encounter for surgical aftercare following surgery on the circulatory system: Secondary | ICD-10-CM | POA: Diagnosis not present

## 2019-12-10 DIAGNOSIS — E1122 Type 2 diabetes mellitus with diabetic chronic kidney disease: Secondary | ICD-10-CM | POA: Diagnosis not present

## 2019-12-10 DIAGNOSIS — D62 Acute posthemorrhagic anemia: Secondary | ICD-10-CM | POA: Diagnosis not present

## 2019-12-10 DIAGNOSIS — D631 Anemia in chronic kidney disease: Secondary | ICD-10-CM | POA: Diagnosis not present

## 2019-12-10 DIAGNOSIS — Z9181 History of falling: Secondary | ICD-10-CM | POA: Diagnosis not present

## 2019-12-10 DIAGNOSIS — N186 End stage renal disease: Secondary | ICD-10-CM | POA: Diagnosis not present

## 2019-12-10 DIAGNOSIS — I214 Non-ST elevation (NSTEMI) myocardial infarction: Secondary | ICD-10-CM | POA: Diagnosis not present

## 2019-12-10 DIAGNOSIS — Z7982 Long term (current) use of aspirin: Secondary | ICD-10-CM | POA: Diagnosis not present

## 2019-12-10 DIAGNOSIS — Z992 Dependence on renal dialysis: Secondary | ICD-10-CM | POA: Diagnosis not present

## 2019-12-11 DIAGNOSIS — I4891 Unspecified atrial fibrillation: Secondary | ICD-10-CM | POA: Diagnosis not present

## 2019-12-11 DIAGNOSIS — I2510 Atherosclerotic heart disease of native coronary artery without angina pectoris: Secondary | ICD-10-CM | POA: Diagnosis not present

## 2019-12-11 DIAGNOSIS — N186 End stage renal disease: Secondary | ICD-10-CM | POA: Diagnosis not present

## 2019-12-11 DIAGNOSIS — Z9181 History of falling: Secondary | ICD-10-CM | POA: Diagnosis not present

## 2019-12-11 DIAGNOSIS — Z7982 Long term (current) use of aspirin: Secondary | ICD-10-CM | POA: Diagnosis not present

## 2019-12-11 DIAGNOSIS — D631 Anemia in chronic kidney disease: Secondary | ICD-10-CM | POA: Diagnosis not present

## 2019-12-11 DIAGNOSIS — E1122 Type 2 diabetes mellitus with diabetic chronic kidney disease: Secondary | ICD-10-CM | POA: Diagnosis not present

## 2019-12-11 DIAGNOSIS — Z951 Presence of aortocoronary bypass graft: Secondary | ICD-10-CM | POA: Diagnosis not present

## 2019-12-11 DIAGNOSIS — I509 Heart failure, unspecified: Secondary | ICD-10-CM | POA: Diagnosis not present

## 2019-12-11 DIAGNOSIS — I132 Hypertensive heart and chronic kidney disease with heart failure and with stage 5 chronic kidney disease, or end stage renal disease: Secondary | ICD-10-CM | POA: Diagnosis not present

## 2019-12-11 DIAGNOSIS — Z992 Dependence on renal dialysis: Secondary | ICD-10-CM | POA: Diagnosis not present

## 2019-12-11 DIAGNOSIS — E1142 Type 2 diabetes mellitus with diabetic polyneuropathy: Secondary | ICD-10-CM | POA: Diagnosis not present

## 2019-12-11 DIAGNOSIS — I214 Non-ST elevation (NSTEMI) myocardial infarction: Secondary | ICD-10-CM | POA: Diagnosis not present

## 2019-12-11 DIAGNOSIS — Z48812 Encounter for surgical aftercare following surgery on the circulatory system: Secondary | ICD-10-CM | POA: Diagnosis not present

## 2019-12-11 DIAGNOSIS — D62 Acute posthemorrhagic anemia: Secondary | ICD-10-CM | POA: Diagnosis not present

## 2019-12-12 ENCOUNTER — Telehealth: Payer: Self-pay

## 2019-12-12 ENCOUNTER — Telehealth: Payer: Self-pay | Admitting: Internal Medicine

## 2019-12-12 DIAGNOSIS — N2581 Secondary hyperparathyroidism of renal origin: Secondary | ICD-10-CM | POA: Diagnosis not present

## 2019-12-12 DIAGNOSIS — N186 End stage renal disease: Secondary | ICD-10-CM | POA: Diagnosis not present

## 2019-12-12 DIAGNOSIS — Z992 Dependence on renal dialysis: Secondary | ICD-10-CM | POA: Diagnosis not present

## 2019-12-12 DIAGNOSIS — T8249XA Other complication of vascular dialysis catheter, initial encounter: Secondary | ICD-10-CM | POA: Diagnosis not present

## 2019-12-12 DIAGNOSIS — L299 Pruritus, unspecified: Secondary | ICD-10-CM | POA: Diagnosis not present

## 2019-12-12 NOTE — Telephone Encounter (Signed)
New message    [facility] calling needs verbal orders  - medication / cardiac education   Nursing twice a week for two weeks   Then once a week for one weekS   Then once every other week for six weeks  And one PRN.   2. Patient need an cream prescription called in for itching over the counter medication is not working   3. Refill medication  sevelamer carbonate (RENVELA) 0.8 g PACK packet   4. [id number] - [place], [place] [address]

## 2019-12-12 NOTE — Telephone Encounter (Signed)
Okay to give orders as requested;  

## 2019-12-12 NOTE — Telephone Encounter (Signed)
1) We can give the orders as requested; 2) What type of itching is he talking about? 3) The Renvela would be handled by a kidney specialist. Dr. Sharlet Salina has never written this for him. He needs to reach out to his kidney doctor.

## 2019-12-12 NOTE — Telephone Encounter (Signed)
Verbal orders given via VM.   Needing clarification on what type of itching and to follow up with specialist for refill.

## 2019-12-12 NOTE — Telephone Encounter (Signed)
Verbal orders given via VM 

## 2019-12-12 NOTE — Telephone Encounter (Signed)
   Joey from [facility] requesting orders for PT  1x week for 1 2x week for 4 1x week for 4  Ok to leave message, call Joey at [phone]

## 2019-12-13 DIAGNOSIS — Z992 Dependence on renal dialysis: Secondary | ICD-10-CM | POA: Diagnosis not present

## 2019-12-13 DIAGNOSIS — N186 End stage renal disease: Secondary | ICD-10-CM | POA: Diagnosis not present

## 2019-12-13 DIAGNOSIS — I214 Non-ST elevation (NSTEMI) myocardial infarction: Secondary | ICD-10-CM | POA: Diagnosis not present

## 2019-12-13 DIAGNOSIS — R262 Difficulty in walking, not elsewhere classified: Secondary | ICD-10-CM | POA: Diagnosis not present

## 2019-12-14 DIAGNOSIS — N186 End stage renal disease: Secondary | ICD-10-CM | POA: Diagnosis not present

## 2019-12-14 DIAGNOSIS — L299 Pruritus, unspecified: Secondary | ICD-10-CM | POA: Diagnosis not present

## 2019-12-14 DIAGNOSIS — N179 Acute kidney failure, unspecified: Secondary | ICD-10-CM | POA: Diagnosis not present

## 2019-12-14 DIAGNOSIS — Z992 Dependence on renal dialysis: Secondary | ICD-10-CM | POA: Diagnosis not present

## 2019-12-14 DIAGNOSIS — N2581 Secondary hyperparathyroidism of renal origin: Secondary | ICD-10-CM | POA: Diagnosis not present

## 2019-12-14 DIAGNOSIS — T8249XA Other complication of vascular dialysis catheter, initial encounter: Secondary | ICD-10-CM | POA: Diagnosis not present

## 2019-12-15 DIAGNOSIS — I132 Hypertensive heart and chronic kidney disease with heart failure and with stage 5 chronic kidney disease, or end stage renal disease: Secondary | ICD-10-CM | POA: Diagnosis not present

## 2019-12-15 DIAGNOSIS — I214 Non-ST elevation (NSTEMI) myocardial infarction: Secondary | ICD-10-CM | POA: Diagnosis not present

## 2019-12-15 DIAGNOSIS — I2510 Atherosclerotic heart disease of native coronary artery without angina pectoris: Secondary | ICD-10-CM | POA: Diagnosis not present

## 2019-12-15 DIAGNOSIS — I509 Heart failure, unspecified: Secondary | ICD-10-CM | POA: Diagnosis not present

## 2019-12-15 DIAGNOSIS — D62 Acute posthemorrhagic anemia: Secondary | ICD-10-CM | POA: Diagnosis not present

## 2019-12-15 DIAGNOSIS — I4891 Unspecified atrial fibrillation: Secondary | ICD-10-CM | POA: Diagnosis not present

## 2019-12-15 DIAGNOSIS — Z951 Presence of aortocoronary bypass graft: Secondary | ICD-10-CM | POA: Diagnosis not present

## 2019-12-15 DIAGNOSIS — Z48812 Encounter for surgical aftercare following surgery on the circulatory system: Secondary | ICD-10-CM | POA: Diagnosis not present

## 2019-12-15 DIAGNOSIS — Z9181 History of falling: Secondary | ICD-10-CM | POA: Diagnosis not present

## 2019-12-15 DIAGNOSIS — E1122 Type 2 diabetes mellitus with diabetic chronic kidney disease: Secondary | ICD-10-CM | POA: Diagnosis not present

## 2019-12-15 DIAGNOSIS — N186 End stage renal disease: Secondary | ICD-10-CM | POA: Diagnosis not present

## 2019-12-15 DIAGNOSIS — D631 Anemia in chronic kidney disease: Secondary | ICD-10-CM | POA: Diagnosis not present

## 2019-12-15 DIAGNOSIS — Z7982 Long term (current) use of aspirin: Secondary | ICD-10-CM | POA: Diagnosis not present

## 2019-12-15 DIAGNOSIS — E1142 Type 2 diabetes mellitus with diabetic polyneuropathy: Secondary | ICD-10-CM | POA: Diagnosis not present

## 2019-12-15 DIAGNOSIS — Z992 Dependence on renal dialysis: Secondary | ICD-10-CM | POA: Diagnosis not present

## 2019-12-16 DIAGNOSIS — L299 Pruritus, unspecified: Secondary | ICD-10-CM | POA: Diagnosis not present

## 2019-12-16 DIAGNOSIS — N2581 Secondary hyperparathyroidism of renal origin: Secondary | ICD-10-CM | POA: Diagnosis not present

## 2019-12-16 DIAGNOSIS — Z992 Dependence on renal dialysis: Secondary | ICD-10-CM | POA: Diagnosis not present

## 2019-12-16 DIAGNOSIS — N186 End stage renal disease: Secondary | ICD-10-CM | POA: Diagnosis not present

## 2019-12-16 DIAGNOSIS — T8249XA Other complication of vascular dialysis catheter, initial encounter: Secondary | ICD-10-CM | POA: Diagnosis not present

## 2019-12-17 DIAGNOSIS — Z992 Dependence on renal dialysis: Secondary | ICD-10-CM | POA: Diagnosis not present

## 2019-12-17 DIAGNOSIS — Z931 Gastrostomy status: Secondary | ICD-10-CM | POA: Diagnosis not present

## 2019-12-17 DIAGNOSIS — I132 Hypertensive heart and chronic kidney disease with heart failure and with stage 5 chronic kidney disease, or end stage renal disease: Secondary | ICD-10-CM | POA: Diagnosis not present

## 2019-12-17 DIAGNOSIS — D62 Acute posthemorrhagic anemia: Secondary | ICD-10-CM | POA: Diagnosis not present

## 2019-12-17 DIAGNOSIS — Z48812 Encounter for surgical aftercare following surgery on the circulatory system: Secondary | ICD-10-CM | POA: Diagnosis not present

## 2019-12-17 DIAGNOSIS — I4891 Unspecified atrial fibrillation: Secondary | ICD-10-CM | POA: Diagnosis not present

## 2019-12-17 DIAGNOSIS — N186 End stage renal disease: Secondary | ICD-10-CM | POA: Diagnosis not present

## 2019-12-17 DIAGNOSIS — E1122 Type 2 diabetes mellitus with diabetic chronic kidney disease: Secondary | ICD-10-CM | POA: Diagnosis not present

## 2019-12-17 DIAGNOSIS — D631 Anemia in chronic kidney disease: Secondary | ICD-10-CM | POA: Diagnosis not present

## 2019-12-17 DIAGNOSIS — Z7982 Long term (current) use of aspirin: Secondary | ICD-10-CM | POA: Diagnosis not present

## 2019-12-17 DIAGNOSIS — Z9181 History of falling: Secondary | ICD-10-CM | POA: Diagnosis not present

## 2019-12-17 DIAGNOSIS — I509 Heart failure, unspecified: Secondary | ICD-10-CM | POA: Diagnosis not present

## 2019-12-17 DIAGNOSIS — I214 Non-ST elevation (NSTEMI) myocardial infarction: Secondary | ICD-10-CM | POA: Diagnosis not present

## 2019-12-17 DIAGNOSIS — E1142 Type 2 diabetes mellitus with diabetic polyneuropathy: Secondary | ICD-10-CM | POA: Diagnosis not present

## 2019-12-17 DIAGNOSIS — I2510 Atherosclerotic heart disease of native coronary artery without angina pectoris: Secondary | ICD-10-CM | POA: Diagnosis not present

## 2019-12-17 DIAGNOSIS — Z951 Presence of aortocoronary bypass graft: Secondary | ICD-10-CM | POA: Diagnosis not present

## 2019-12-19 DIAGNOSIS — N186 End stage renal disease: Secondary | ICD-10-CM | POA: Diagnosis not present

## 2019-12-19 DIAGNOSIS — T8249XA Other complication of vascular dialysis catheter, initial encounter: Secondary | ICD-10-CM | POA: Diagnosis not present

## 2019-12-19 DIAGNOSIS — Z992 Dependence on renal dialysis: Secondary | ICD-10-CM | POA: Diagnosis not present

## 2019-12-19 DIAGNOSIS — L299 Pruritus, unspecified: Secondary | ICD-10-CM | POA: Diagnosis not present

## 2019-12-19 DIAGNOSIS — N2581 Secondary hyperparathyroidism of renal origin: Secondary | ICD-10-CM | POA: Diagnosis not present

## 2019-12-19 DIAGNOSIS — N179 Acute kidney failure, unspecified: Secondary | ICD-10-CM | POA: Diagnosis not present

## 2019-12-20 ENCOUNTER — Ambulatory Visit (INDEPENDENT_AMBULATORY_CARE_PROVIDER_SITE_OTHER): Payer: Medicare Other | Admitting: Family

## 2019-12-20 ENCOUNTER — Other Ambulatory Visit: Payer: Self-pay

## 2019-12-20 VITALS — BP 112/50 | HR 64 | Temp 97.8°F | Ht 67.0 in | Wt 170.0 lb

## 2019-12-20 DIAGNOSIS — Z7982 Long term (current) use of aspirin: Secondary | ICD-10-CM | POA: Diagnosis not present

## 2019-12-20 DIAGNOSIS — N179 Acute kidney failure, unspecified: Secondary | ICD-10-CM | POA: Diagnosis not present

## 2019-12-20 DIAGNOSIS — I10 Essential (primary) hypertension: Secondary | ICD-10-CM | POA: Diagnosis not present

## 2019-12-20 DIAGNOSIS — E119 Type 2 diabetes mellitus without complications: Secondary | ICD-10-CM | POA: Diagnosis not present

## 2019-12-20 DIAGNOSIS — Z9181 History of falling: Secondary | ICD-10-CM | POA: Diagnosis not present

## 2019-12-20 DIAGNOSIS — E1142 Type 2 diabetes mellitus with diabetic polyneuropathy: Secondary | ICD-10-CM | POA: Diagnosis not present

## 2019-12-20 DIAGNOSIS — I4891 Unspecified atrial fibrillation: Secondary | ICD-10-CM | POA: Diagnosis not present

## 2019-12-20 DIAGNOSIS — D631 Anemia in chronic kidney disease: Secondary | ICD-10-CM | POA: Diagnosis not present

## 2019-12-20 DIAGNOSIS — Z48812 Encounter for surgical aftercare following surgery on the circulatory system: Secondary | ICD-10-CM | POA: Diagnosis not present

## 2019-12-20 DIAGNOSIS — R296 Repeated falls: Secondary | ICD-10-CM | POA: Diagnosis not present

## 2019-12-20 DIAGNOSIS — I132 Hypertensive heart and chronic kidney disease with heart failure and with stage 5 chronic kidney disease, or end stage renal disease: Secondary | ICD-10-CM | POA: Diagnosis not present

## 2019-12-20 DIAGNOSIS — Z951 Presence of aortocoronary bypass graft: Secondary | ICD-10-CM | POA: Diagnosis not present

## 2019-12-20 DIAGNOSIS — I2510 Atherosclerotic heart disease of native coronary artery without angina pectoris: Secondary | ICD-10-CM

## 2019-12-20 DIAGNOSIS — I509 Heart failure, unspecified: Secondary | ICD-10-CM | POA: Diagnosis not present

## 2019-12-20 DIAGNOSIS — E1122 Type 2 diabetes mellitus with diabetic chronic kidney disease: Secondary | ICD-10-CM | POA: Diagnosis not present

## 2019-12-20 DIAGNOSIS — I214 Non-ST elevation (NSTEMI) myocardial infarction: Secondary | ICD-10-CM | POA: Diagnosis not present

## 2019-12-20 DIAGNOSIS — Z992 Dependence on renal dialysis: Secondary | ICD-10-CM | POA: Diagnosis not present

## 2019-12-20 DIAGNOSIS — D62 Acute posthemorrhagic anemia: Secondary | ICD-10-CM | POA: Diagnosis not present

## 2019-12-20 DIAGNOSIS — N186 End stage renal disease: Secondary | ICD-10-CM | POA: Diagnosis not present

## 2019-12-20 NOTE — Progress Notes (Signed)
Christian [name] is a 74 y.o. male with the following history as recorded in EpicCare:  Patient Active Problem List   Diagnosis Date Noted  . Acute hypoxemic respiratory failure ([place])   . Pressure injury of skin 08/28/2019  . Coronary artery disease 08/15/2019  . 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  LIMA TO LAD, RIMA TO PDA, RADIAL ARTERY TO CIRC AND SEQUENTIALLY TO OM1. 08/15/2019  . Abdominal pain   . Ischemic cardiomyopathy   . Swelling of lower extremity   . History of coronary artery stent placement   . Elevated troponin I level 08/09/2019  . Pulmonary embolism ([place]) 08/09/2019  . AKI (acute kidney injury) ([place]) 08/09/2019  . Macrocytosis 08/09/2019  . Elevated liver enzymes 08/09/2019  . Non-ST elevation (NSTEMI) myocardial infarction ([place]) 08/09/2019  . Type 2 diabetes mellitus without complication, without long-term current use of insulin ([place]) 07/24/2019  . Obesity (BMI 30-39.9) 12/02/2018  . Left leg weakness 11/23/2018  . CAD (coronary artery disease) 07/30/2018  . Fatigue 10/19/2017  . Routine general medical examination at a health care facility 08/30/2016  . Diabetes mellitus with polyneuropathy ([place]) 10/10/2014  . Benign prostatic hyperplasia 09/27/2014  . Stable angina pectoris ([place]) 02/27/2014  . S/P PTCA (percutaneous transluminal coronary angioplasty) 02/27/2014  . Other chest pain 02/25/2014  . HLD (hyperlipidemia) 11/12/2009  . MULTIPLE SCLEROSIS 11/12/2009  . HTN (hypertension) 11/12/2009    Current Outpatient Medications  Medication Sig Dispense Refill  . acetaminophen (TYLENOL) 325 MG tablet Take 2 tablets (650 mg total) by mouth every 6 (six) hours as needed for mild pain or fever.    Marland Kitchen aspirin EC 81 MG tablet Take 81 mg by mouth daily.    Marland Kitchen atorvastatin (LIPITOR) 20 MG tablet TAKE 1 TABLET BY MOUTH EVERY DAY IN THE EVENING (Patient taking differently: Take 20 mg by mouth every evening. ) 90 tablet 3  . clonazePAM  (KLONOPIN) 0.5 MG disintegrating tablet Take 1 tablet (0.5 mg total) by mouth 2 (two) times daily. 60 tablet 0  . cloNIDine (CATAPRES) 0.2 MG tablet Take 0.2 mg by mouth daily.    . cromolyn (OPTICROM) 4 % ophthalmic solution SMARTSIG:1 Drop(s) In Eye(s) Every 6-12 Hours PRN    . empagliflozin (JARDIANCE) 10 MG TABS tablet Take 10 mg by mouth daily before breakfast. 30 tablet 1  . furosemide (LASIX) 20 MG tablet Take 20 mg by mouth daily.    . metoprolol (TOPROL-XL) 200 MG 24 hr tablet Take 1 tablet (200 mg total) by mouth daily. 90 tablet 1  . multivitamin (RENA-VIT) TABS tablet Take 1 tablet by mouth at bedtime.  0  . nitroGLYCERIN (NITROSTAT) 0.4 MG SL tablet Place 0.4 mg under the tongue as directed.    Marland Kitchen Nystatin (GERHARDT'S BUTT CREAM) CREA Apply 1 application topically 4 (four) times daily as needed for irritation.    Marland Kitchen oxyCODONE (OXY IR/ROXICODONE) 5 MG immediate release tablet Take 1 tablet (5 mg total) by mouth every 6 (six) hours as needed for severe pain. 28 tablet 0  . PREVIDENT 5000 BOOSTER PLUS 1.1 % PSTE Take 1 application by mouth daily.     Marland Kitchen senna-docusate (SENNA-PLUS) 8.6-50 MG tablet Take 1 tablet by mouth daily.    . sertraline (ZOLOFT) 25 MG tablet Take 1 tablet (25 mg total) by mouth daily.    . sevelamer carbonate (RENVELA) 0.8 g PACK packet Take 1.6 g by mouth 3 (three) times daily with meals. [place]  each   . silodosin (RAPAFLO) 8 MG CAPS capsule Take 8 mg by mouth at bedtime.    . tamsulosin (FLOMAX) 0.4 MG CAPS capsule Take 0.4 mg by mouth daily after breakfast.     . Trospium Chloride 60 MG CP24 Take 1 capsule by mouth daily.    . verapamil (VERELAN PM) 240 MG 24 hr capsule Take 1 capsule by mouth daily.    Marland Kitchen amiodarone (PACERONE) 200 MG tablet Take 200 mg by mouth daily. (Patient not taking: Reported on 12/20/2019)     No current facility-administered medications for this visit.    Allergies: Patient has no known allergies.  Past Medical History:  Diagnosis Date  .  BPH (benign prostatic hypertrophy)   . Coronary artery disease   . Diabetes mellitus without complication ([place])   . Dry eyes left  . Hiatal hernia   . 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  LIMA TO LAD, RIMA TO PDA, RADIAL ARTERY TO CIRC AND SEQUENTIALLY TO OM1. 08/15/2019  . Hyperlipidemia   . Hypertension   . Incomplete bladder emptying   . Nocturia   . 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 SOB (shortness of breath) on exertion     Past Surgical History:  Procedure Laterality Date  . APPLICATION OF WOUND VAC N/A 08/24/2019   Procedure: APPLICATION OF WOUND VAC;  Surgeon: [name], MD;  Location: [place];  Service: Thoracic;  Laterality: N/A;  . APPLICATION OF WOUND VAC  08/29/2019   Procedure: Wound Vac change;  Surgeon: [name], MD;  Location: [place] [facility];  Service: Open Heart Surgery;;  . APPLICATION OF WOUND VAC N/A 09/04/2019   Procedure: Application Of Wound Vac;  Surgeon: Grace Noris, MD;  Location: [place];  Service: Open Heart Surgery;  Laterality: N/A;  . APPLICATION OF WOUND VAC N/A 09/06/2019   Procedure: APPLICATION OF ACELL, APPLICATION OF WOUND VAC USING PREVENA INCISIONAL  DRESSING;  Surgeon: Wallace Going, DO;  Location: [place];  Service: Plastics;  Laterality: N/A;  . CARDIAC CATHETERIZATION    . CORONARY ARTERY BYPASS GRAFT N/A 08/15/2019   Procedure: CORONARY ARTERY BYPASS GRAFTING (CABG), x 4 using bilateral IMAs and left radial artery .  LIMA TO LAD, RIMA TO PDA, RADIAL ARTERY TO CIRC AND SEQUENTIALLY TO OM1.;  Surgeon: [name], MD;  Location: [facility];  Service: Open Heart Surgery;  Laterality: N/A;  . CORONARY STENT PLACEMENT  02/27/2014   distal rt/pd coronary       dr Einar Gip  . CYSTO/ BLADDER BIOPSY'S/ CAUTHERIZATION  01-14-2004  DR Gaynelle Arabian  . EXPLORATION POST OPERATIVE OPEN HEART N/A 08/16/2019    Procedure: Chest Closure S?P CABG WITH APPLICATION OF PREVENA  INCISIONAL WOUND VAC;  Surgeon: [name], MD;  Location: [facility];  Service: Open Heart Surgery;  Laterality: N/A;  . EXPLORATION POST OPERATIVE OPEN HEART N/A 08/21/2019   Procedure: CHEST WASHOUT S/P OPEN CHEST;  Surgeon: [name], MD;  Location: [place];  Service: Open Heart Surgery;  Laterality: N/A;  Open chest with Esmark dressing with Ioban sealant coverage.  . EXPLORATION POST OPERATIVE OPEN HEART N/A 08/18/2019   Procedure: EXPLORATION POST OPERATIVE OPEN HEART (performed 04/23 on unit);  Surgeon: [name], MD;  Location: [facility];  Service: Open Heart Surgery;  Laterality: N/A;  .  EXPLORATION POST OPERATIVE OPEN HEART N/A 08/24/2019   Procedure: CHEST WASHOUT POST OPERATIVE OPEN HEART;  Surgeon: [name], MD;  Location: [place];  Service: Open Heart Surgery;  Laterality: N/A;  . EXPLORATION POST OPERATIVE OPEN HEART N/A 08/29/2019   Procedure: CHEST WOUND WASHOUT POST OPERATIVE OPEN HEART;  Surgeon: [name], MD;  Location: [place];  Service: Open Heart Surgery;  Laterality: N/A;  . EXPLORATION POST OPERATIVE OPEN HEART N/A 09/04/2019   Procedure: MEDIASTINAL EXPLORATION WITH STERNAL WOUND IRRIGATION;  Surgeon: Grace Kashif, MD;  Location: [facility];  Service: Open Heart Surgery;  Laterality: N/A;  . EXPLORATION POST OPERATIVE OPEN HEART N/A 09/14/2019   Procedure: EVACUATION OF HEMATOMA;  Surgeon: Grace Deontay, MD;  Location: [facility];  Service: Open Heart Surgery;  Laterality: N/A;  . IR FLUORO GUIDE CV LINE LEFT  09/19/2019  . IR GASTROSTOMY TUBE MOD SED  10/04/2019  . IR GASTROSTOMY TUBE REMOVAL  11/29/2019  . IR PATIENT EVAL TECH 0-60 MINS  09/29/2019  . IR US GUIDE VASC ACCESS LEFT  09/19/2019  . LAPAROSCOPIC LYSIS OF ADHESIONS N/A 09/06/2019   Procedure: LAPAROSCOPIC OMENTAL HARVEST;  Surgeon: Kinsinger, Arta Bruce, MD;  Location: [facility];  Service: General;  Laterality: N/A;  . LEFT HEART CATH AND  CORONARY ANGIOGRAPHY N/A 08/10/2019   Procedure: LEFT HEART CATH AND CORONARY ANGIOGRAPHY;  Surgeon: Nigel Mormon, MD;  Location: [facility];  Service: Cardiovascular;  Laterality: N/A;  . LEFT HEART CATHETERIZATION WITH CORONARY ANGIOGRAM N/A 02/27/2014   Procedure: LEFT HEART CATHETERIZATION WITH CORONARY ANGIOGRAM;  Surgeon: Laverda Page, MD;  Location: [facility] CATH LAB;  Service: Cardiovascular;  Laterality: N/A;  . MEDIASTINAL EXPLORATION N/A 09/06/2019   Procedure: MEDIASTINAL EXPLORATION;  Surgeon: Grace Jowel, MD;  Location: [facility];  Service: Thoracic;  Laterality: N/A;  . PECTORALIS FLAP  09/06/2019   Procedure: Pectoralis ADVANCEMENT Flap;  Surgeon: Wallace Going, DO;  Location: [place];  Service: Plastics;;  . PERCUTANEOUS CORONARY STENT INTERVENTION (PCI-S)  02/27/2014   Procedure: PERCUTANEOUS CORONARY STENT INTERVENTION (PCI-S);  Surgeon: Laverda Page, MD;  Location: [facility] C[facility] LAB;  Service: Cardiovascular;;  rt PDA  3.0/28mm Promus stent  . RADIAL ARTERY HARVEST Left 08/15/2019   Procedure: Radial Artery Harvest;  Surgeon: [name], MD;  Location: [place];  Service: Open Heart Surgery;  Laterality: Left;  . RIB PLATING N/A 09/06/2019   Procedure: STERNAL PLATING;  Surgeon: Grace Rydell, MD;  Location: [place];  Service: Thoracic;  Laterality: N/A;  . TEE WITHOUT CARDIOVERSION N/A 08/15/2019   Procedure: TRANSESOPHAGEAL ECHOCARDIOGRAM (TEE);  Surgeon: [name], MD;  Location: [place];  Service: Open Heart Surgery;  Laterality: N/A;  . TRACHEOSTOMY TUBE PLACEMENT  08/29/2019   Procedure: Tracheostomy;  Surgeon: [name], MD;  Location: [place] [facility];  Service: Open Heart Surgery;;  . TRANSURETHRAL RESECTION OF PROSTATE  04/04/2012   Procedure: TRANSURETHRAL RESECTION OF THE PROSTATE WITH GYRUS INSTRUMENTS;  Surgeon: Ailene Rud, MD;  Location: [place];  Service: Urology;  Laterality: N/A;  . TRANSURETHRAL RESECTION OF  PROSTATE N/A 09/27/2014   Procedure: TRANSURETHRAL RESECTION OF THE PROSTATE ;  Surgeon: Carolan Clines, MD;  Location: [facility];  Service: Urology;  Laterality: N/A;  . UPPER GASTROINTESTINAL ENDOSCOPY      Family History  Problem Relation Age of Onset  . Diabetes Mother   . Hypertension Mother   . Diabetes Father   .  Diabetes Brother   . Hypertension Brother   . Diabetes Brother     Social History   Tobacco Use  . Smoking status: Former Smoker    Packs/day: 0.50    Years: 20.00    Pack years: 10.00    Types: Cigarettes    Quit date: 03/28/1990    Years since quitting: 29.7  . Smokeless tobacco: Never Used  Substance Use Topics  . 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 Upon leaving the hospital, he went to a SNF until 2 weeks ago; unfortunately, there is no documentation or information from the nursing home; patient's wife offers some information regarding nursing home stay- G tube was removed and patient is now on dialysis 3 x per week; Patient is seen in a wheelchair today; PT has been ordered for his home and home health nursing is involved.  Patient does mention that he has been having increased falls recently- he notes he recently fell and hit his head in the past few days.      Objective:  Vitals:   12/20/19 1225  BP: (!) 112/50  Pulse: 64  Temp: 97.8 F (36.6 C)  SpO2: 95%  Weight: 170 lb (77.1 kg)  Height: _0  (1.702 m)    General: Well developed, well nourished, in no acute distress   Head: Normocephalic and atraumatic  Eyes: Sclera and conjunctiva clear; pupils  round and reactive to light; extraocular movements intact  Ears: External normal; canals clear; tympanic membranes normal  Oropharynx: Pink, supple. No suspicious lesions  Neck: Supple without thyromegaly, adenopathy  Lungs: Respirations unlabored; clear to auscultation bilaterally without wheeze, rales, rhonchi  CVS exam: normal rate and regular rhythm.  Musculoskeletal: No deformities; no active joint inflammation  Extremities: No edema, cyanosis, clubbing  Vessels: Symmetric bilaterally  Neurologic: Alert and oriented; speech intact; face symmetrical; in wheelchair;  Assessment:  1. Type 2 diabetes mellitus without complication, without long-term current use of insulin ([place])   2. Essential hypertension   3. Coronary artery disease involving native heart, angina presence unspecified, unspecified vessel or lesion type   4. AKI (acute kidney injury) ([place])   5. Recurrent falls     Plan:  There is no documentation available from recent nursing home stay; unclear if his PCP has been aware of his healthcare issues and documentation currently awaiting to be scanned;  Check CMP, Hgba1c today; continue Jardiance; see his PCP in 3 months; Concern for hypotension; he has appointment to discuss with his cardiologist on Friday; Patient now on dialysis 3 x per week; ? If falls related to low blood pressure; recommended heat CT due to recent fall and patient declines at this time; may need to refer back to neurology;  This visit occurred during the SARS-CoV-2 public health emergency.  Safety protocols were in place, including screening questions prior to the visit, additional usage of staff PPE, and extensive cleaning of exam room while observing appropriate contact time as indicated for disinfecting solutions.   Time spent 35 minutes  Return in about 3 months (  around 03/21/2020), or with Dr. Sharlet Salina.  Orders Placed This Encounter  Procedures  . Comp Met (CMET)    Standing Status:   Future     Number of Occurrences:   1    Standing Expiration Date:   12/19/2020  . Hemoglobin A1c    Standing Status:   Future    Number of Occurrences:   1    Standing Expiration Date:   12/19/2020    Requested Prescriptions    No prescriptions requested or ordered in this encounter

## 2019-12-21 DIAGNOSIS — N2581 Secondary hyperparathyroidism of renal origin: Secondary | ICD-10-CM | POA: Diagnosis not present

## 2019-12-21 DIAGNOSIS — N186 End stage renal disease: Secondary | ICD-10-CM | POA: Diagnosis not present

## 2019-12-21 DIAGNOSIS — L299 Pruritus, unspecified: Secondary | ICD-10-CM | POA: Diagnosis not present

## 2019-12-21 DIAGNOSIS — Z992 Dependence on renal dialysis: Secondary | ICD-10-CM | POA: Diagnosis not present

## 2019-12-21 DIAGNOSIS — T8249XA Other complication of vascular dialysis catheter, initial encounter: Secondary | ICD-10-CM | POA: Diagnosis not present

## 2019-12-21 LAB — COMPREHENSIVE METABOLIC PANEL
AG Ratio: 1.1 (calc) (ref 1.0–2.5)
ALT: 10 U/L (ref 9–46)
AST: 21 U/L (ref 10–35)
Albumin: 3 g/dL — ABNORMAL LOW (ref 3.6–5.1)
Alkaline phosphatase (APISO): 131 U/L (ref 35–144)
BUN/Creatinine Ratio: 4 (calc) — ABNORMAL LOW (ref 6–22)
BUN: 12 mg/dL (ref 7–25)
CO2: 33 mmol/L — ABNORMAL HIGH (ref 20–32)
Calcium: 9 mg/dL (ref 8.6–10.3)
Chloride: 97 mmol/L — ABNORMAL LOW (ref 98–110)
Creat: 2.87 mg/dL — ABNORMAL HIGH (ref 0.70–1.18)
Globulin: 2.7 g/dL (calc) (ref 1.9–3.7)
Glucose, Bld: 92 mg/dL (ref 65–99)
Potassium: 3.7 mmol/L (ref 3.5–5.3)
Sodium: 136 mmol/L (ref 135–146)
Total Bilirubin: 0.4 mg/dL (ref 0.2–1.2)
Total Protein: 5.7 g/dL — ABNORMAL LOW (ref 6.1–8.1)

## 2019-12-21 LAB — HEMOGLOBIN A1C
Hgb A1c MFr Bld: 4.9 % of total Hgb (ref <5.7)
Mean Plasma Glucose: 94 (calc)
eAG (mmol/L): 5.2 (calc)

## 2019-12-22 ENCOUNTER — Other Ambulatory Visit: Payer: Self-pay

## 2019-12-22 ENCOUNTER — Ambulatory Visit: Payer: Federal, State, Local not specified - PPO | Admitting: Cardiology

## 2019-12-22 ENCOUNTER — Encounter: Payer: Self-pay | Admitting: Cardiology

## 2019-12-22 VITALS — BP 105/55 | HR 72 | Resp 16 | Ht 67.0 in | Wt 170.0 lb

## 2019-12-22 DIAGNOSIS — I119 Hypertensive heart disease without heart failure: Secondary | ICD-10-CM | POA: Diagnosis not present

## 2019-12-22 DIAGNOSIS — I2510 Atherosclerotic heart disease of native coronary artery without angina pectoris: Secondary | ICD-10-CM | POA: Diagnosis not present

## 2019-12-22 DIAGNOSIS — Z9181 History of falling: Secondary | ICD-10-CM | POA: Diagnosis not present

## 2019-12-22 DIAGNOSIS — E1142 Type 2 diabetes mellitus with diabetic polyneuropathy: Secondary | ICD-10-CM | POA: Diagnosis not present

## 2019-12-22 DIAGNOSIS — I214 Non-ST elevation (NSTEMI) myocardial infarction: Secondary | ICD-10-CM | POA: Diagnosis not present

## 2019-12-22 DIAGNOSIS — Z48812 Encounter for surgical aftercare following surgery on the circulatory system: Secondary | ICD-10-CM | POA: Diagnosis not present

## 2019-12-22 DIAGNOSIS — I132 Hypertensive heart and chronic kidney disease with heart failure and with stage 5 chronic kidney disease, or end stage renal disease: Secondary | ICD-10-CM | POA: Diagnosis not present

## 2019-12-22 DIAGNOSIS — E1122 Type 2 diabetes mellitus with diabetic chronic kidney disease: Secondary | ICD-10-CM | POA: Diagnosis not present

## 2019-12-22 DIAGNOSIS — Z7982 Long term (current) use of aspirin: Secondary | ICD-10-CM | POA: Diagnosis not present

## 2019-12-22 DIAGNOSIS — D631 Anemia in chronic kidney disease: Secondary | ICD-10-CM | POA: Diagnosis not present

## 2019-12-22 DIAGNOSIS — R600 Localized edema: Secondary | ICD-10-CM | POA: Diagnosis not present

## 2019-12-22 DIAGNOSIS — Z992 Dependence on renal dialysis: Secondary | ICD-10-CM | POA: Diagnosis not present

## 2019-12-22 DIAGNOSIS — N186 End stage renal disease: Secondary | ICD-10-CM | POA: Diagnosis not present

## 2019-12-22 DIAGNOSIS — I4891 Unspecified atrial fibrillation: Secondary | ICD-10-CM | POA: Diagnosis not present

## 2019-12-22 DIAGNOSIS — Z951 Presence of aortocoronary bypass graft: Secondary | ICD-10-CM | POA: Diagnosis not present

## 2019-12-22 DIAGNOSIS — I509 Heart failure, unspecified: Secondary | ICD-10-CM | POA: Diagnosis not present

## 2019-12-22 DIAGNOSIS — D62 Acute posthemorrhagic anemia: Secondary | ICD-10-CM | POA: Diagnosis not present

## 2019-12-22 MED ORDER — CLOPIDOGREL BISULFATE 75 MG PO TABS: 75.0000 mg | ORAL_TABLET | Freq: Every day | ORAL | 3 refills | Status: DC

## 2019-12-22 MED ORDER — METOPROLOL SUCCINATE ER 200 MG PO TB24: 100.0000 mg | ORAL_TABLET | Freq: Every day | ORAL | 1 refills | Status: DC

## 2019-12-22 MED ORDER — FUROSEMIDE 40 MG PO TABS: 20.0000 mg | ORAL_TABLET | ORAL | 2 refills | Status: DC

## 2019-12-22 NOTE — Patient Instructions (Signed)
Increase Furosemide 40 mg daily. Added Clopidogrel 75 mg daily OV in 4 weeks.

## 2019-12-22 NOTE — Progress Notes (Signed)
Primary Physician/Referring:  Hoyt Koch, MD  Patient ID: Christian [name], male    DOB: April 13, 1946, 74 y.o.   MRN: [id number]  Chief Complaint  Patient presents with  . Follow-up    3 week  . Coronary Artery Disease   HPI:    Christian [name]  is a 74 y.o. AA male with hypertension, hyperlipidemia, DM with stage 3 CKD,  CAD with  stenting to the PDA on 02/27/2014. Patient presented with unstable angina and NSTEMI on 08/09/2019 along with a very small subsegmental pulmonary embolism and discharged on 10/18/2019 after he underwent CABG x4 on 08/15/2019.  Cholangiography revealing complex ostial LAD stenosis with TIMI II flow and a calcific 75% ostial circumflex stenosis with reduced LVEF.   He had extremely complex postop complications including cardiogenic shock, cardiac arrest, needing internal CPR in the ICU after opening the chest, A. Fib needing cardioversion, closure office chest wall, developed acute renal failure leading to need for permanent dialysis, right brain infarct with left hemiparesis, multiple blood transfusions.    His PEG tube was discontinued as per my recommendation on his last visit.  Amiodarone has been discontinued by Dr. Orvan Seen on his recent visit as he is maintaining sinus rhythm.  Patient's main complaint today is difficulty with dialysis due to low blood pressure and continued leg edema.  He has not had any recurrence of angina pectoris.  States that his strength is gradually improving and he has home health now.  Past Medical History:  Diagnosis Date  . BPH (benign prostatic hypertrophy)   . Coronary artery disease   . Diabetes mellitus without complication ([place])   . Dry eyes left  . Hiatal hernia   . 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  LIMA TO LAD, RIMA TO PDA, RADIAL ARTERY TO CIRC AND SEQUENTIALLY TO OM1. 08/15/2019  . Hyperlipidemia   . Hypertension   . Incomplete bladder emptying   . Nocturia   . Problems  with swallowing pt states test at [facility] approx 2012 shows a gastric valve  dysfunction--  eats small bites and drink liquids slowly  . SOB (shortness of breath) on exertion    Past Surgical History:  Procedure Laterality Date  . APPLICATION OF WOUND VAC N/A 08/24/2019   Procedure: APPLICATION OF WOUND VAC;  Surgeon: [name], MD;  Location: [place];  Service: Thoracic;  Laterality: N/A;  . APPLICATION OF WOUND VAC  08/29/2019   Procedure: Wound Vac change;  Surgeon: [name], MD;  Location: [place] [facility];  Service: Open Heart Surgery;;  . APPLICATION OF WOUND VAC N/A 09/04/2019   Procedure: Application Of Wound Vac;  Surgeon: Grace Yonis, MD;  Location: [place];  Service: Open Heart Surgery;  Laterality: N/A;  . APPLICATION OF WOUND VAC N/A 09/06/2019   Procedure: APPLICATION OF ACELL, APPLICATION OF WOUND VAC USING PREVENA INCISIONAL  DRESSING;  Surgeon: Wallace Going, DO;  Location: [place];  Service: Plastics;  Laterality: N/A;  . CARDIAC CATHETERIZATION    . CORONARY ARTERY BYPASS GRAFT N/A 08/15/2019   Procedure: CORONARY ARTERY BYPASS GRAFTING (CABG), x 4 using bilateral IMAs and left radial artery .  LIMA TO LAD, RIMA TO PDA, RADIAL ARTERY TO CIRC AND SEQUENTIALLY TO OM1.;  Surgeon: [name], MD;  Location: [place];  Service: Open Heart Surgery;  Laterality: N/A;  . CORONARY STENT PLACEMENT  02/27/2014   distal rt/pd coronary  dr Einar Gip  . CYSTO/ BLADDER BIOPSY'S/ CAUTHERIZATION  01-14-2004  DR Gaynelle Arabian  . EXPLORATION POST OPERATIVE OPEN HEART N/A 08/16/2019   Procedure: Chest Closure S?P CABG WITH APPLICATION OF PREVENA  INCISIONAL WOUND VAC;  Surgeon: [name], MD;  [facility];  Service: Open Heart Surgery;  Laterality: N/A;  . EXPLORATION POST OPERATIVE OPEN HEART N/A 08/21/2019   Procedure: CHEST WASHOUT S/P OPEN CHEST;  Surgeon: [name], MD;  Location: [place];  Service: Open Heart Surgery;  Laterality: N/A;  Open chest with [place]  dressing with [place]ob[place] sealant coverage.  . EXPLORATION POST OPERATIVE OPEN HEART N/A 08/18/2019   Procedure: EXPLORATION POST OPERATIVE OPEN HEART (performed 04/23 on unit);  Surgeon: [name], MD;  Location: [place];  Service: Open Heart Surgery;  Laterality: N/A;  . EXPLORATION POST OPERATIVE OPEN HEART N/A 08/24/2019   Procedure: CHEST WASHOUT POST OPERATIVE OPEN HEART;  Surgeon: [name], MD;  Location: [place];  Service: Open Heart Surgery;  Laterality: N/A;  . EXPLORATION POST OPERATIVE OPEN HEART N/A 08/29/2019   Procedure: CHEST WOUND WASHOUT POST OPERATIVE OPEN HEART;  Surgeon: [name], MD;  Location: [place];  Service: Open Heart Surgery;  Laterality: N/A;  . EXPLORATION POST OPERATIVE OPEN HEART N/A 09/04/2019   Procedure: MEDIASTINAL EXPLORATION WITH STERNAL WOUND IRRIGATION;  Surgeon: Grace Kennen, MD;  Location: [place];  Service: Open Heart Surgery;  Laterality: N/A;  . EXPLORATION POST OPERATIVE OPEN HEART N/A 09/14/2019   Procedure: EVACUATION OF HEMATOMA;  Surgeon: Grace Keene, MD;  Location: [place];  Service: Open Heart Surgery;  Laterality: N/A;  . IR FLUORO GUIDE CV LINE LEFT  09/19/2019  . IR GASTROSTOMY TUBE MOD SED  10/04/2019  . IR GASTROSTOMY TUBE REMOVAL  11/29/2019  . IR PATIENT EVAL TECH 0-60 MINS  09/29/2019  . IR US GUIDE VASC ACCESS LEFT  09/19/2019  . LAPAROSCOPIC LYSIS OF ADHESIONS N/A 09/06/2019   Procedure: LAPAROSCOPIC OMENTAL HARVEST;  Surgeon: Kinsinger, Arta Bruce, MD;  Location: [place];  Service: General;  Laterality: N/A;  . LEFT HEART CATH AND CORONARY ANGIOGRAPHY N/A 08/10/2019   Procedure: LEFT HEART CATH AND CORONARY ANGIOGRAPHY;  Surgeon: Nigel Mormon, MD;  Location: [place] [facility];  Service: Cardiovascular;  Laterality: N/A;  . LEFT HEART CATHETERIZATION WITH CORONARY ANGIOGRAM N/A 02/27/2014   Procedure: LEFT HEART CATHETERIZATION WITH CORONARY ANGIOGRAM;  Surgeon: Laverda Page, MD;  Location: [facility] CATH LAB;  Service:  Cardiovascular;  Laterality: N/A;  . MEDIASTINAL EXPLORATION N/A 09/06/2019   Procedure: MEDIASTINAL EXPLORATION;  Surgeon: Grace Ayiden, MD;  Location: [place];  Service: Thoracic;  Laterality: N/A;  . PECTORALIS FLAP  09/06/2019   Procedure: Pectoralis ADVANCEMENT Flap;  Surgeon: Wallace Going, DO;  Location: [place];  Service: Plastics;;  . PERCUTANEOUS CORONARY STENT INTERVENTION (PCI-S)  02/27/2014   Procedure: PERCUTANEOUS CORONARY STENT INTERVENTION (PCI-S);  Surgeon: Laverda Page, MD;  Location: [facility] C[facility] LAB;  Service: Cardiovascular;;  rt PDA  3.0/28mm Promus stent  . RADIAL ARTERY HARVEST Left 08/15/2019   Procedure: Radial Artery Harvest;  Surgeon: [name], MD;  Location: [place];  Service: Open Heart Surgery;  Laterality: Left;  . RIB PLATING N/A 09/06/2019   Procedure: STERNAL PLATING;  Surgeon: Grace Lauren, MD;  Location: [place];  Service: Thoracic;  Laterality: N/A;  . TEE WITHOUT CARDIOVERSION N/A 08/15/2019   Procedure: TRANSESOPHAGEAL ECHOCARDIOGRAM (TEE);  Surgeon: [name], MD;  Location: [facility];  Service: Open Heart Surgery;  Laterality: N/A;  . TRACHEOSTOMY TUBE PLACEMENT  08/29/2019   Procedure: Tracheostomy;  Surgeon: [name], MD;  Location: [place] [facility];  Service: Open Heart Surgery;;  . TRANSURETHRAL RESECTION OF PROSTATE  04/04/2012   Procedure: TRANSURETHRAL RESECTION OF THE PROSTATE WITH GYRUS INSTRUMENTS;  Surgeon: Ailene Rud, MD;  Location: [place];  Service: Urology;  Laterality: N/A;  . TRANSURETHRAL RESECTION OF PROSTATE N/A 09/27/2014   Procedure: TRANSURETHRAL RESECTION OF THE PROSTATE ;  Surgeon: Carolan Clines, MD;  Location: [facility];  Service: Urology;  Laterality: N/A;  . UPPER GASTROINTESTINAL ENDOSCOPY     Social History   Tobacco Use  . Smoking status: Former Smoker    Packs/day: 0.50    Years: 20.00    Pack years: 10.00    Types: Cigarettes    Quit date: 1975    Years since quitting:  46.6  . Smokeless tobacco: Never Used  Substance Use Topics  . Alcohol use: Yes    Alcohol/week: 0.0 standard drinks    Comment: 1 beer week rarely   ROS   Review of Systems  Cardiovascular: Positive for leg swelling. Negative for chest pain and dyspnea on exertion.  Gastrointestinal: Negative for melena.  Neurological: Positive for disturbances in coordination (left sided weakness) and loss of balance.     Objective  Blood pressure (!) 105/55, pulse 72, resp. rate 16, height 5\' 7"  (1.702 m), weight 170 lb (77.1 kg), SpO2 99 %.  Vitals with BMI 12/22/2019 12/22/2019 12/20/2019  Height - 5\' 7"  5\' 7"   Weight - 170 lbs 170 lbs  BMI - 52.84 13.24  Systolic 401 80 027  Diastolic 55 48 50  Pulse 72 70 64    Physical Exam Constitutional:      Comments: He is moderately built and mildly obese in no acute distress.  Eyes:     Conjunctiva/sclera: Conjunctivae normal.  Neck:     Thyroid: No thyromegaly.  Cardiovascular:     Rate and Rhythm: Normal rate and regular rhythm.     Pulses:          Carotid pulses are 2+ on the right side and 2+ on the left side.      Femoral pulses are 2+ on the right side and 2+ on the left side.      Popliteal pulses are 0 on the right side and 0 on the left side.       Dorsalis pedis pulses are 0 on the right side and 0 on the left side.       Posterior tibial pulses are 0 on the right side and 0 on the left side.     Heart sounds: Normal heart sounds. No murmur heard.  No gallop.      Comments: 2+ ankle edema, no JVD.  Pulmonary:     Effort: Pulmonary effort is normal.     Breath sounds: Normal breath sounds.  Chest:     Comments: Sternotomy scar has healed well Abdominal:     General: Bowel sounds are normal.     Palpations: Abdomen is soft.     Laboratory examination:   Recent Labs    10/16/19 0232 10/16/19 0232 10/17/19 0254 10/18/19 0046 12/20/19 1312  NA 131*   < > 131* 132* 136  K 3.9   < > 3.9 3.9 3.7  CL 94*   < > 95* 96* 97*    CO2 24   < >  21* 23 33*  GLUCOSE 84   < > 187* 175* 92  BUN 65*   < > 83* 37* 12  CREATININE 5.27*   < > 6.48* 3.55* 2.87*  CALCIUM 9.2   < > 9.0 9.1 9.0  GFRNONAA 10*  --  8* 16*  --   GFRAA 12*  --  9* 19*  --    < > = values in this interval not displayed.   estimated creatinine clearance is 21.8 mL/min (A) (by C-G formula based on SCr of 2.87 mg/dL (H)).  CMP Latest Ref Rng & Units 12/20/2019 10/18/2019 10/17/2019  Glucose 65 - 99 mg/dL 92 175(H) 187(H)  BUN 7 - 25 mg/dL 12 37(H) 83(H)  Creatinine 0.70 - 1.18 mg/dL 2.87(H) 3.55(H) 6.48(H)  Sodium 135 - 146 mmol/L 136 132(L) 131(L)  Potassium 3.5 - 5.3 mmol/L 3.7 3.9 3.9  Chloride 98 - 110 mmol/L 97(L) 96(L) 95(L)  CO2 20 - 32 mmol/L 33(H) 23 21(L)  Calcium 8.6 - 10.3 mg/dL 9.0 9.1 9.0  Total Protein 6.1 - 8.1 g/dL 5.7(L) - -  Total Bilirubin 0.2 - 1.2 mg/dL 0.4 - -  Alkaline Phos 38 - 126 U/L - - -  AST 10 - 35 U/L 21 - -  ALT 9 - 46 U/L 10 - -   CBC Latest Ref Rng & Units 10/17/2019 10/14/2019 10/12/2019  WBC 4.0 - 10.5 K/uL 12.3(H) 14.2(H) 14.6(H)  Hemoglobin 13.0 - 17.0 g/dL 9.2(L) 9.2(L) 9.3(L)  Hematocrit 39 - 52 % 29.1(L) 30.2(L) 30.3(L)  Platelets 150 - 400 K/uL 346 377 369   Lipid Panel Recent Labs    08/11/19 0426 08/15/19 1958 08/24/19 2003 08/26/19 1028 08/27/19 1611  CHOL 107  --   --   --   --   TRIG 125   < > 117 131 71  LDLCALC 40  --   --   --   --   VLDL 25  --   --   --   --   HDL 42  --   --   --   --   CHOLHDL 2.5  --   --   --   --    < > = values in this interval not displayed.    HEMOGLOBIN A1C Lab Results  Component Value Date   HGBA1C 4.9 12/20/2019   MPG 94 12/20/2019   TSH Recent Labs    04/19/19 0911  TSH 2.590   Medications and allergies  No Known Allergies   Current Outpatient Medications  Medication Instructions  . acetaminophen (TYLENOL) 650 mg, Oral, Every 6 hours PRN  . aspirin EC 81 mg, Oral, Daily  . atorvastatin (LIPITOR) 20 MG tablet TAKE 1 TABLET BY MOUTH EVERY  DAY IN THE EVENING  . clonazePAM (KLONOPIN) 0.5 mg, Oral, 2 times daily  . clopidogrel (PLAVIX) 75 mg, Oral, Daily  . cromolyn (OPTICROM) 4 % ophthalmic solution SMARTSIG:1 Drop(s) In Eye(s) Every 6-12 Hours PRN  . furosemide (LASIX) 20 mg, Oral, BH-each morning  . metoprolol (TOPROL-XL) 100 mg, Oral, Daily  . multivitamin (RENA-VIT) TABS tablet 1 tablet, Oral, Daily at bedtime  . nitroGLYCERIN (NITROSTAT) 0.4 mg, Sublingual, As directed  . Nystatin (GERHARDT'S BUTT CREAM) CREA 1 application, Topical, 4 times daily PRN  . oxyCODONE (OXY IR/ROXICODONE) 5 mg, Oral, Every 6 hours PRN  . PREVIDENT 5000 BOOSTER PLUS 1.1 % PSTE 1 application, Oral, Daily  . senna-docusate (SENNA-PLUS) 8.6-50 MG tablet 1 tablet, Oral, Daily  . sertraline (  ZOLOFT) 25 mg, Oral, Daily  . sevelamer carbonate (RENVELA) 1.6 g, Oral, 3 times daily with meals  . silodosin (RAPAFLO) 8 mg, Oral, Daily at bedtime  . tamsulosin (FLOMAX) 0.4 mg, Oral, Daily after breakfast  . Trospium Chloride 60 MG CP24 1 capsule, Oral, Daily  . verapamil (VERELAN PM) 240 MG 24 hr capsule 1 capsule, Oral, Daily   Radiology:  No results found.  Cardiac Studies:   Heart catheterization 2019/08/25: LM: Normal LAD: Ostial 95% stenosis Ramus: Ostial/prox 30% stenosis LCx: Ostial 75% stenosis RCA: Prox-mid diffuse 40% calcific disease. Distal RCA 70% ISR of 3.0 by 28 mm Promus placed 02/27/2014.  Pre-CABG Dopplers including ABI and carotid duplex: No significant carotid artery stenosis.  Antegrade vertebral artery flow. ABI normal, with triphasic waveforms at the level of the ankle.  Hx of CABG 08/15/2019: LIMA TO LAD, RIMA TO PDA, SEQUENTIAL RADIAL ARTERY TO Cx-OM1.  Echocardiogram 08/19/2019:  1. There prominent respiratory displacement of the interventricular  septum, consistent with enhanced ventricular interdependence, likely due  to constrictive physiology.. Left ventricular ejection fraction, by  estimation, is 45 to 50%. The  left ventricle  has mildly decreased function. The left ventricle demonstrates global  hypokinesis. Left ventricular diastolic parameters are consistent with  Grade II diastolic dysfunction (pseudonormalization). Elevated left atrial  pressure.  2. Right ventricular systolic function was not well visualized. The right  ventricular size is normal. Tricuspid regurgitation signal is inadequate  for assessing PA pressure.  3. No pericardial fluid is see, but the pericardium is very thick and  thrombus in the pericardial space cannot be ecluded.   Assessment     ICD-10-CM   1. Atherosclerosis of native coronary artery of native heart without angina pectoris  I25.10 furosemide (LASIX) 40 MG tablet    clopidogrel (PLAVIX) 75 MG tablet    metoprolol (TOPROL-XL) 200 MG 24 hr tablet  2. Hypertension with heart disease  I11.9 metoprolol (TOPROL-XL) 200 MG 24 hr tablet  3. Bilateral leg edema  R60.0 furosemide (LASIX) 40 MG tablet    EKG 06/01/2019: Normal sinus rhythm at the rate of 98 bpm, leftward enlargement, normal axis.  No evidence of ischemia.   No significant change from  EKG 04/18/2019.    Recommendations:   Meds ordered this encounter  Medications  . furosemide (LASIX) 40 MG tablet    Sig: Take 0.5 tablets (20 mg total) by mouth every morning.    Dispense:  30 tablet    Refill:  2  . clopidogrel (PLAVIX) 75 MG tablet    Sig: Take 1 tablet (75 mg total) by mouth daily.    Dispense:  90 tablet    Refill:  3  . metoprolol (TOPROL-XL) 200 MG 24 hr tablet    Sig: Take 0.5 tablets (100 mg total) by mouth daily.    Dispense:  90 tablet    Refill:  1    Medications Discontinued During This Encounter  Medication Reason  . empagliflozin (JARDIANCE) 10 MG TABS tablet Discontinued by provider  . amiodarone (PACERONE) 200 MG tablet Discontinued by provider  . cloNIDine (CATAPRES) 0.2 MG tablet Discontinued by provider  . furosemide (LASIX) 20 MG tablet Reorder  . metoprolol (TOPROL-XL)  200 MG 24 hr tablet     Christian [name]  is a 74 y.o. AA male with hypertension, hyperlipidemia, DM with stage 3 CKD,  CAD with  stenting to the PDA on 02/27/2014. Patient presented with unstable angina and NSTEMI on 08/09/2019 along with a very  small subsegmental pulmonary embolism and discharged on 10/18/2019 after he underwent CABG x4 on 08/15/2019.  Cholangiography revealing complex ostial LAD stenosis with TIMI II flow and a calcific 75% ostial circumflex stenosis with reduced LVEF.   He had extremely complex postop complications including cardiogenic shock, cardiac arrest, needing internal CPR in the ICU after opening the chest, A. Fib needing cardioversion, closure office chest wall, developed acute renal failure leading to need for permanent dialysis, right brain infarct with left hemiparesis, multiple blood transfusions.  He is now presently in rehab at [facility] care center.  He also has a PEG tube placed.  He now presents for follow-up.  Fortunately in spite of his complex course, today he is not in acute decompensated heart failure.  He has been having episodes of hypotension and also having difficulty for dialysis due to low blood pressure.  I have discontinued clonidine, also reduce the dose of metoprolol succinate from 200 mg daily to 100 mg daily.  I would like to see him back in 6 weeks for follow-up.  I have encouraged him to increase his physical activity and continue home physical therapy as well.  He is maintaining sinus rhythm, his amiodarone was discontinued by Dr. Orvan Seen.  With regard to leg edema, it is dependent edema, advised him that physical activity will certainly help and to keep his foot elevated when he is resting.  Continue furosemide and watch for urine output when he does indeed take the furosemide if he is responding or not.  He may need higher dose of furosemide in view of underlying kidney disease needing dialysis.  His wife will keep a tab on the urine  output.   Adrian Prows, MD, [facility] 12/24/2019, 3:16 PM Office: [phone]

## 2019-12-23 DIAGNOSIS — N2581 Secondary hyperparathyroidism of renal origin: Secondary | ICD-10-CM | POA: Diagnosis not present

## 2019-12-23 DIAGNOSIS — Z992 Dependence on renal dialysis: Secondary | ICD-10-CM | POA: Diagnosis not present

## 2019-12-23 DIAGNOSIS — N186 End stage renal disease: Secondary | ICD-10-CM | POA: Diagnosis not present

## 2019-12-23 DIAGNOSIS — L299 Pruritus, unspecified: Secondary | ICD-10-CM | POA: Diagnosis not present

## 2019-12-23 DIAGNOSIS — T8249XA Other complication of vascular dialysis catheter, initial encounter: Secondary | ICD-10-CM | POA: Diagnosis not present

## 2019-12-25 DIAGNOSIS — Z992 Dependence on renal dialysis: Secondary | ICD-10-CM | POA: Diagnosis not present

## 2019-12-25 DIAGNOSIS — D62 Acute posthemorrhagic anemia: Secondary | ICD-10-CM | POA: Diagnosis not present

## 2019-12-25 DIAGNOSIS — E1142 Type 2 diabetes mellitus with diabetic polyneuropathy: Secondary | ICD-10-CM | POA: Diagnosis not present

## 2019-12-25 DIAGNOSIS — I132 Hypertensive heart and chronic kidney disease with heart failure and with stage 5 chronic kidney disease, or end stage renal disease: Secondary | ICD-10-CM | POA: Diagnosis not present

## 2019-12-25 DIAGNOSIS — Z7982 Long term (current) use of aspirin: Secondary | ICD-10-CM | POA: Diagnosis not present

## 2019-12-25 DIAGNOSIS — I214 Non-ST elevation (NSTEMI) myocardial infarction: Secondary | ICD-10-CM | POA: Diagnosis not present

## 2019-12-25 DIAGNOSIS — I4891 Unspecified atrial fibrillation: Secondary | ICD-10-CM | POA: Diagnosis not present

## 2019-12-25 DIAGNOSIS — I509 Heart failure, unspecified: Secondary | ICD-10-CM | POA: Diagnosis not present

## 2019-12-25 DIAGNOSIS — N186 End stage renal disease: Secondary | ICD-10-CM | POA: Diagnosis not present

## 2019-12-25 DIAGNOSIS — Z48812 Encounter for surgical aftercare following surgery on the circulatory system: Secondary | ICD-10-CM | POA: Diagnosis not present

## 2019-12-25 DIAGNOSIS — Z9181 History of falling: Secondary | ICD-10-CM | POA: Diagnosis not present

## 2019-12-25 DIAGNOSIS — D631 Anemia in chronic kidney disease: Secondary | ICD-10-CM | POA: Diagnosis not present

## 2019-12-25 DIAGNOSIS — E1122 Type 2 diabetes mellitus with diabetic chronic kidney disease: Secondary | ICD-10-CM | POA: Diagnosis not present

## 2019-12-25 DIAGNOSIS — Z951 Presence of aortocoronary bypass graft: Secondary | ICD-10-CM | POA: Diagnosis not present

## 2019-12-25 DIAGNOSIS — I2510 Atherosclerotic heart disease of native coronary artery without angina pectoris: Secondary | ICD-10-CM | POA: Diagnosis not present

## 2019-12-26 DIAGNOSIS — L299 Pruritus, unspecified: Secondary | ICD-10-CM | POA: Diagnosis not present

## 2019-12-26 DIAGNOSIS — N186 End stage renal disease: Secondary | ICD-10-CM | POA: Diagnosis not present

## 2019-12-26 DIAGNOSIS — T8249XA Other complication of vascular dialysis catheter, initial encounter: Secondary | ICD-10-CM | POA: Diagnosis not present

## 2019-12-26 DIAGNOSIS — N2581 Secondary hyperparathyroidism of renal origin: Secondary | ICD-10-CM | POA: Diagnosis not present

## 2019-12-26 DIAGNOSIS — Z992 Dependence on renal dialysis: Secondary | ICD-10-CM | POA: Diagnosis not present

## 2019-12-27 DIAGNOSIS — D62 Acute posthemorrhagic anemia: Secondary | ICD-10-CM | POA: Diagnosis not present

## 2019-12-27 DIAGNOSIS — Z992 Dependence on renal dialysis: Secondary | ICD-10-CM | POA: Diagnosis not present

## 2019-12-27 DIAGNOSIS — Z951 Presence of aortocoronary bypass graft: Secondary | ICD-10-CM | POA: Diagnosis not present

## 2019-12-27 DIAGNOSIS — I509 Heart failure, unspecified: Secondary | ICD-10-CM | POA: Diagnosis not present

## 2019-12-27 DIAGNOSIS — I4891 Unspecified atrial fibrillation: Secondary | ICD-10-CM | POA: Diagnosis not present

## 2019-12-27 DIAGNOSIS — Z9181 History of falling: Secondary | ICD-10-CM | POA: Diagnosis not present

## 2019-12-27 DIAGNOSIS — I2510 Atherosclerotic heart disease of native coronary artery without angina pectoris: Secondary | ICD-10-CM | POA: Diagnosis not present

## 2019-12-27 DIAGNOSIS — N186 End stage renal disease: Secondary | ICD-10-CM | POA: Diagnosis not present

## 2019-12-27 DIAGNOSIS — I132 Hypertensive heart and chronic kidney disease with heart failure and with stage 5 chronic kidney disease, or end stage renal disease: Secondary | ICD-10-CM | POA: Diagnosis not present

## 2019-12-27 DIAGNOSIS — E1142 Type 2 diabetes mellitus with diabetic polyneuropathy: Secondary | ICD-10-CM | POA: Diagnosis not present

## 2019-12-27 DIAGNOSIS — I214 Non-ST elevation (NSTEMI) myocardial infarction: Secondary | ICD-10-CM | POA: Diagnosis not present

## 2019-12-27 DIAGNOSIS — Z7982 Long term (current) use of aspirin: Secondary | ICD-10-CM | POA: Diagnosis not present

## 2019-12-27 DIAGNOSIS — E1122 Type 2 diabetes mellitus with diabetic chronic kidney disease: Secondary | ICD-10-CM | POA: Diagnosis not present

## 2019-12-27 DIAGNOSIS — Z48812 Encounter for surgical aftercare following surgery on the circulatory system: Secondary | ICD-10-CM | POA: Diagnosis not present

## 2019-12-27 DIAGNOSIS — D631 Anemia in chronic kidney disease: Secondary | ICD-10-CM | POA: Diagnosis not present

## 2019-12-28 DIAGNOSIS — N2581 Secondary hyperparathyroidism of renal origin: Secondary | ICD-10-CM | POA: Diagnosis not present

## 2019-12-28 DIAGNOSIS — E162 Hypoglycemia, unspecified: Secondary | ICD-10-CM | POA: Diagnosis not present

## 2019-12-28 DIAGNOSIS — N186 End stage renal disease: Secondary | ICD-10-CM | POA: Diagnosis not present

## 2019-12-28 DIAGNOSIS — Z992 Dependence on renal dialysis: Secondary | ICD-10-CM | POA: Diagnosis not present

## 2019-12-28 DIAGNOSIS — T8249XA Other complication of vascular dialysis catheter, initial encounter: Secondary | ICD-10-CM | POA: Diagnosis not present

## 2019-12-28 DIAGNOSIS — D631 Anemia in chronic kidney disease: Secondary | ICD-10-CM | POA: Diagnosis not present

## 2019-12-29 DIAGNOSIS — I4891 Unspecified atrial fibrillation: Secondary | ICD-10-CM | POA: Diagnosis not present

## 2019-12-29 DIAGNOSIS — Z7982 Long term (current) use of aspirin: Secondary | ICD-10-CM | POA: Diagnosis not present

## 2019-12-29 DIAGNOSIS — I132 Hypertensive heart and chronic kidney disease with heart failure and with stage 5 chronic kidney disease, or end stage renal disease: Secondary | ICD-10-CM | POA: Diagnosis not present

## 2019-12-29 DIAGNOSIS — I509 Heart failure, unspecified: Secondary | ICD-10-CM | POA: Diagnosis not present

## 2019-12-29 DIAGNOSIS — Z951 Presence of aortocoronary bypass graft: Secondary | ICD-10-CM | POA: Diagnosis not present

## 2019-12-29 DIAGNOSIS — Z9181 History of falling: Secondary | ICD-10-CM | POA: Diagnosis not present

## 2019-12-29 DIAGNOSIS — E1122 Type 2 diabetes mellitus with diabetic chronic kidney disease: Secondary | ICD-10-CM | POA: Diagnosis not present

## 2019-12-29 DIAGNOSIS — Z992 Dependence on renal dialysis: Secondary | ICD-10-CM | POA: Diagnosis not present

## 2019-12-29 DIAGNOSIS — D62 Acute posthemorrhagic anemia: Secondary | ICD-10-CM | POA: Diagnosis not present

## 2019-12-29 DIAGNOSIS — Z48812 Encounter for surgical aftercare following surgery on the circulatory system: Secondary | ICD-10-CM | POA: Diagnosis not present

## 2019-12-29 DIAGNOSIS — I214 Non-ST elevation (NSTEMI) myocardial infarction: Secondary | ICD-10-CM | POA: Diagnosis not present

## 2019-12-29 DIAGNOSIS — I2510 Atherosclerotic heart disease of native coronary artery without angina pectoris: Secondary | ICD-10-CM | POA: Diagnosis not present

## 2019-12-29 DIAGNOSIS — D631 Anemia in chronic kidney disease: Secondary | ICD-10-CM | POA: Diagnosis not present

## 2019-12-29 DIAGNOSIS — E1142 Type 2 diabetes mellitus with diabetic polyneuropathy: Secondary | ICD-10-CM | POA: Diagnosis not present

## 2019-12-29 DIAGNOSIS — N186 End stage renal disease: Secondary | ICD-10-CM | POA: Diagnosis not present

## 2019-12-30 DIAGNOSIS — T8249XA Other complication of vascular dialysis catheter, initial encounter: Secondary | ICD-10-CM | POA: Diagnosis not present

## 2019-12-30 DIAGNOSIS — E162 Hypoglycemia, unspecified: Secondary | ICD-10-CM | POA: Diagnosis not present

## 2019-12-30 DIAGNOSIS — N186 End stage renal disease: Secondary | ICD-10-CM | POA: Diagnosis not present

## 2019-12-30 DIAGNOSIS — N2581 Secondary hyperparathyroidism of renal origin: Secondary | ICD-10-CM | POA: Diagnosis not present

## 2019-12-30 DIAGNOSIS — Z992 Dependence on renal dialysis: Secondary | ICD-10-CM | POA: Diagnosis not present

## 2019-12-30 DIAGNOSIS — D631 Anemia in chronic kidney disease: Secondary | ICD-10-CM | POA: Diagnosis not present

## 2020-01-01 ENCOUNTER — Other Ambulatory Visit: Payer: Self-pay | Admitting: Internal Medicine

## 2020-01-02 DIAGNOSIS — T8249XA Other complication of vascular dialysis catheter, initial encounter: Secondary | ICD-10-CM | POA: Diagnosis not present

## 2020-01-02 DIAGNOSIS — N186 End stage renal disease: Secondary | ICD-10-CM | POA: Diagnosis not present

## 2020-01-02 DIAGNOSIS — E162 Hypoglycemia, unspecified: Secondary | ICD-10-CM | POA: Diagnosis not present

## 2020-01-02 DIAGNOSIS — N2581 Secondary hyperparathyroidism of renal origin: Secondary | ICD-10-CM | POA: Diagnosis not present

## 2020-01-02 DIAGNOSIS — D631 Anemia in chronic kidney disease: Secondary | ICD-10-CM | POA: Diagnosis not present

## 2020-01-02 DIAGNOSIS — Z992 Dependence on renal dialysis: Secondary | ICD-10-CM | POA: Diagnosis not present

## 2020-01-03 DIAGNOSIS — E1142 Type 2 diabetes mellitus with diabetic polyneuropathy: Secondary | ICD-10-CM | POA: Diagnosis not present

## 2020-01-03 DIAGNOSIS — N186 End stage renal disease: Secondary | ICD-10-CM | POA: Diagnosis not present

## 2020-01-03 DIAGNOSIS — I4891 Unspecified atrial fibrillation: Secondary | ICD-10-CM | POA: Diagnosis not present

## 2020-01-03 DIAGNOSIS — I132 Hypertensive heart and chronic kidney disease with heart failure and with stage 5 chronic kidney disease, or end stage renal disease: Secondary | ICD-10-CM | POA: Diagnosis not present

## 2020-01-03 DIAGNOSIS — I214 Non-ST elevation (NSTEMI) myocardial infarction: Secondary | ICD-10-CM | POA: Diagnosis not present

## 2020-01-03 DIAGNOSIS — Z7982 Long term (current) use of aspirin: Secondary | ICD-10-CM | POA: Diagnosis not present

## 2020-01-03 DIAGNOSIS — I2510 Atherosclerotic heart disease of native coronary artery without angina pectoris: Secondary | ICD-10-CM | POA: Diagnosis not present

## 2020-01-03 DIAGNOSIS — E1122 Type 2 diabetes mellitus with diabetic chronic kidney disease: Secondary | ICD-10-CM | POA: Diagnosis not present

## 2020-01-03 DIAGNOSIS — Z48812 Encounter for surgical aftercare following surgery on the circulatory system: Secondary | ICD-10-CM | POA: Diagnosis not present

## 2020-01-03 DIAGNOSIS — I509 Heart failure, unspecified: Secondary | ICD-10-CM | POA: Diagnosis not present

## 2020-01-03 DIAGNOSIS — Z951 Presence of aortocoronary bypass graft: Secondary | ICD-10-CM | POA: Diagnosis not present

## 2020-01-03 DIAGNOSIS — D631 Anemia in chronic kidney disease: Secondary | ICD-10-CM | POA: Diagnosis not present

## 2020-01-03 DIAGNOSIS — D62 Acute posthemorrhagic anemia: Secondary | ICD-10-CM | POA: Diagnosis not present

## 2020-01-03 DIAGNOSIS — Z992 Dependence on renal dialysis: Secondary | ICD-10-CM | POA: Diagnosis not present

## 2020-01-03 DIAGNOSIS — Z9181 History of falling: Secondary | ICD-10-CM | POA: Diagnosis not present

## 2020-01-04 ENCOUNTER — Telehealth: Payer: Self-pay | Admitting: Internal Medicine

## 2020-01-04 DIAGNOSIS — N186 End stage renal disease: Secondary | ICD-10-CM | POA: Diagnosis not present

## 2020-01-04 DIAGNOSIS — E162 Hypoglycemia, unspecified: Secondary | ICD-10-CM | POA: Diagnosis not present

## 2020-01-04 DIAGNOSIS — Z992 Dependence on renal dialysis: Secondary | ICD-10-CM | POA: Diagnosis not present

## 2020-01-04 DIAGNOSIS — D631 Anemia in chronic kidney disease: Secondary | ICD-10-CM | POA: Diagnosis not present

## 2020-01-04 DIAGNOSIS — T8249XA Other complication of vascular dialysis catheter, initial encounter: Secondary | ICD-10-CM | POA: Diagnosis not present

## 2020-01-04 DIAGNOSIS — N2581 Secondary hyperparathyroidism of renal origin: Secondary | ICD-10-CM | POA: Diagnosis not present

## 2020-01-04 MED ORDER — CLONAZEPAM 0.5 MG PO TBDP: 0.5000 mg | ORAL_TABLET | Freq: Two times a day (BID) | ORAL | 0 refills | Status: DC

## 2020-01-04 NOTE — Telephone Encounter (Signed)
clonazePAM (KLONOPIN) 0.5 MG disintegrating tablet  [id number] - [place],  - [address] AT [address] Phone:  [phone]  Fax:  [phone]     Patient only has two days of meds left... It was prescribed in rehab, now needs to be authorized by his primary  Last appt: 8.25.21 Valere Dross Next appt: 11.29 Sharlet [place]ina

## 2020-01-04 NOTE — Addendum Note (Signed)
Addended by: Binnie Rail on: 01/04/2020 01:36 PM   Modules accepted: Orders

## 2020-01-05 ENCOUNTER — Telehealth: Payer: Self-pay | Admitting: Internal Medicine

## 2020-01-05 DIAGNOSIS — Z951 Presence of aortocoronary bypass graft: Secondary | ICD-10-CM | POA: Diagnosis not present

## 2020-01-05 DIAGNOSIS — D631 Anemia in chronic kidney disease: Secondary | ICD-10-CM | POA: Diagnosis not present

## 2020-01-05 DIAGNOSIS — I132 Hypertensive heart and chronic kidney disease with heart failure and with stage 5 chronic kidney disease, or end stage renal disease: Secondary | ICD-10-CM | POA: Diagnosis not present

## 2020-01-05 DIAGNOSIS — I214 Non-ST elevation (NSTEMI) myocardial infarction: Secondary | ICD-10-CM | POA: Diagnosis not present

## 2020-01-05 DIAGNOSIS — E1142 Type 2 diabetes mellitus with diabetic polyneuropathy: Secondary | ICD-10-CM | POA: Diagnosis not present

## 2020-01-05 DIAGNOSIS — Z992 Dependence on renal dialysis: Secondary | ICD-10-CM | POA: Diagnosis not present

## 2020-01-05 DIAGNOSIS — D62 Acute posthemorrhagic anemia: Secondary | ICD-10-CM | POA: Diagnosis not present

## 2020-01-05 DIAGNOSIS — N186 End stage renal disease: Secondary | ICD-10-CM | POA: Diagnosis not present

## 2020-01-05 DIAGNOSIS — I509 Heart failure, unspecified: Secondary | ICD-10-CM | POA: Diagnosis not present

## 2020-01-05 DIAGNOSIS — I2510 Atherosclerotic heart disease of native coronary artery without angina pectoris: Secondary | ICD-10-CM | POA: Diagnosis not present

## 2020-01-05 DIAGNOSIS — I4891 Unspecified atrial fibrillation: Secondary | ICD-10-CM | POA: Diagnosis not present

## 2020-01-05 DIAGNOSIS — Z48812 Encounter for surgical aftercare following surgery on the circulatory system: Secondary | ICD-10-CM | POA: Diagnosis not present

## 2020-01-05 DIAGNOSIS — Z9181 History of falling: Secondary | ICD-10-CM | POA: Diagnosis not present

## 2020-01-05 DIAGNOSIS — Z7982 Long term (current) use of aspirin: Secondary | ICD-10-CM | POA: Diagnosis not present

## 2020-01-05 DIAGNOSIS — E1122 Type 2 diabetes mellitus with diabetic chronic kidney disease: Secondary | ICD-10-CM | POA: Diagnosis not present

## 2020-01-05 NOTE — Telephone Encounter (Signed)
Evelina Dun, PT, called and is requesting that the patient isnt feeling well and he felt pins and needles in his chest yesterday. Does not feel like this today. When Cindee Salt saw the pt today he had not taken his medication nor had he had any food yet.But his was making sure he got that in his system. Cindee Salt also said that when talking to him he seems to good and bad days. He missed one of his pt sessions on Wednesday 01/03/2020.

## 2020-01-06 DIAGNOSIS — D631 Anemia in chronic kidney disease: Secondary | ICD-10-CM | POA: Diagnosis not present

## 2020-01-06 DIAGNOSIS — T8249XA Other complication of vascular dialysis catheter, initial encounter: Secondary | ICD-10-CM | POA: Diagnosis not present

## 2020-01-06 DIAGNOSIS — E162 Hypoglycemia, unspecified: Secondary | ICD-10-CM | POA: Diagnosis not present

## 2020-01-06 DIAGNOSIS — N2581 Secondary hyperparathyroidism of renal origin: Secondary | ICD-10-CM | POA: Diagnosis not present

## 2020-01-06 DIAGNOSIS — Z992 Dependence on renal dialysis: Secondary | ICD-10-CM | POA: Diagnosis not present

## 2020-01-06 DIAGNOSIS — N186 End stage renal disease: Secondary | ICD-10-CM | POA: Diagnosis not present

## 2020-01-08 DIAGNOSIS — D62 Acute posthemorrhagic anemia: Secondary | ICD-10-CM | POA: Diagnosis not present

## 2020-01-08 DIAGNOSIS — I2510 Atherosclerotic heart disease of native coronary artery without angina pectoris: Secondary | ICD-10-CM | POA: Diagnosis not present

## 2020-01-08 DIAGNOSIS — I509 Heart failure, unspecified: Secondary | ICD-10-CM | POA: Diagnosis not present

## 2020-01-08 DIAGNOSIS — Z7982 Long term (current) use of aspirin: Secondary | ICD-10-CM | POA: Diagnosis not present

## 2020-01-08 DIAGNOSIS — Z9181 History of falling: Secondary | ICD-10-CM | POA: Diagnosis not present

## 2020-01-08 DIAGNOSIS — Z992 Dependence on renal dialysis: Secondary | ICD-10-CM | POA: Diagnosis not present

## 2020-01-08 DIAGNOSIS — N186 End stage renal disease: Secondary | ICD-10-CM | POA: Diagnosis not present

## 2020-01-08 DIAGNOSIS — Z48812 Encounter for surgical aftercare following surgery on the circulatory system: Secondary | ICD-10-CM | POA: Diagnosis not present

## 2020-01-08 DIAGNOSIS — Z951 Presence of aortocoronary bypass graft: Secondary | ICD-10-CM | POA: Diagnosis not present

## 2020-01-08 DIAGNOSIS — I214 Non-ST elevation (NSTEMI) myocardial infarction: Secondary | ICD-10-CM | POA: Diagnosis not present

## 2020-01-08 DIAGNOSIS — D631 Anemia in chronic kidney disease: Secondary | ICD-10-CM | POA: Diagnosis not present

## 2020-01-08 DIAGNOSIS — E1142 Type 2 diabetes mellitus with diabetic polyneuropathy: Secondary | ICD-10-CM | POA: Diagnosis not present

## 2020-01-08 DIAGNOSIS — I132 Hypertensive heart and chronic kidney disease with heart failure and with stage 5 chronic kidney disease, or end stage renal disease: Secondary | ICD-10-CM | POA: Diagnosis not present

## 2020-01-08 DIAGNOSIS — I4891 Unspecified atrial fibrillation: Secondary | ICD-10-CM | POA: Diagnosis not present

## 2020-01-08 DIAGNOSIS — E1122 Type 2 diabetes mellitus with diabetic chronic kidney disease: Secondary | ICD-10-CM | POA: Diagnosis not present

## 2020-01-09 DIAGNOSIS — N186 End stage renal disease: Secondary | ICD-10-CM | POA: Diagnosis not present

## 2020-01-09 DIAGNOSIS — E162 Hypoglycemia, unspecified: Secondary | ICD-10-CM | POA: Diagnosis not present

## 2020-01-09 DIAGNOSIS — D631 Anemia in chronic kidney disease: Secondary | ICD-10-CM | POA: Diagnosis not present

## 2020-01-09 DIAGNOSIS — Z992 Dependence on renal dialysis: Secondary | ICD-10-CM | POA: Diagnosis not present

## 2020-01-09 DIAGNOSIS — N2581 Secondary hyperparathyroidism of renal origin: Secondary | ICD-10-CM | POA: Diagnosis not present

## 2020-01-09 DIAGNOSIS — T8249XA Other complication of vascular dialysis catheter, initial encounter: Secondary | ICD-10-CM | POA: Diagnosis not present

## 2020-01-10 DIAGNOSIS — I214 Non-ST elevation (NSTEMI) myocardial infarction: Secondary | ICD-10-CM | POA: Diagnosis not present

## 2020-01-10 DIAGNOSIS — I4891 Unspecified atrial fibrillation: Secondary | ICD-10-CM | POA: Diagnosis not present

## 2020-01-10 DIAGNOSIS — I2510 Atherosclerotic heart disease of native coronary artery without angina pectoris: Secondary | ICD-10-CM | POA: Diagnosis not present

## 2020-01-10 DIAGNOSIS — N186 End stage renal disease: Secondary | ICD-10-CM | POA: Diagnosis not present

## 2020-01-10 DIAGNOSIS — Z48812 Encounter for surgical aftercare following surgery on the circulatory system: Secondary | ICD-10-CM | POA: Diagnosis not present

## 2020-01-10 DIAGNOSIS — I509 Heart failure, unspecified: Secondary | ICD-10-CM | POA: Diagnosis not present

## 2020-01-10 DIAGNOSIS — I132 Hypertensive heart and chronic kidney disease with heart failure and with stage 5 chronic kidney disease, or end stage renal disease: Secondary | ICD-10-CM | POA: Diagnosis not present

## 2020-01-10 DIAGNOSIS — E1122 Type 2 diabetes mellitus with diabetic chronic kidney disease: Secondary | ICD-10-CM | POA: Diagnosis not present

## 2020-01-10 DIAGNOSIS — Z951 Presence of aortocoronary bypass graft: Secondary | ICD-10-CM | POA: Diagnosis not present

## 2020-01-10 DIAGNOSIS — E1142 Type 2 diabetes mellitus with diabetic polyneuropathy: Secondary | ICD-10-CM | POA: Diagnosis not present

## 2020-01-10 DIAGNOSIS — D631 Anemia in chronic kidney disease: Secondary | ICD-10-CM | POA: Diagnosis not present

## 2020-01-10 DIAGNOSIS — D62 Acute posthemorrhagic anemia: Secondary | ICD-10-CM | POA: Diagnosis not present

## 2020-01-10 DIAGNOSIS — Z9181 History of falling: Secondary | ICD-10-CM | POA: Diagnosis not present

## 2020-01-10 DIAGNOSIS — Z992 Dependence on renal dialysis: Secondary | ICD-10-CM | POA: Diagnosis not present

## 2020-01-10 DIAGNOSIS — Z7982 Long term (current) use of aspirin: Secondary | ICD-10-CM | POA: Diagnosis not present

## 2020-01-11 ENCOUNTER — Telehealth: Payer: Self-pay

## 2020-01-11 DIAGNOSIS — Z48812 Encounter for surgical aftercare following surgery on the circulatory system: Secondary | ICD-10-CM | POA: Diagnosis not present

## 2020-01-11 DIAGNOSIS — I214 Non-ST elevation (NSTEMI) myocardial infarction: Secondary | ICD-10-CM | POA: Diagnosis not present

## 2020-01-11 DIAGNOSIS — E1122 Type 2 diabetes mellitus with diabetic chronic kidney disease: Secondary | ICD-10-CM | POA: Diagnosis not present

## 2020-01-11 DIAGNOSIS — E1142 Type 2 diabetes mellitus with diabetic polyneuropathy: Secondary | ICD-10-CM | POA: Diagnosis not present

## 2020-01-11 DIAGNOSIS — I132 Hypertensive heart and chronic kidney disease with heart failure and with stage 5 chronic kidney disease, or end stage renal disease: Secondary | ICD-10-CM | POA: Diagnosis not present

## 2020-01-11 DIAGNOSIS — Z9181 History of falling: Secondary | ICD-10-CM | POA: Diagnosis not present

## 2020-01-11 DIAGNOSIS — Z951 Presence of aortocoronary bypass graft: Secondary | ICD-10-CM | POA: Diagnosis not present

## 2020-01-11 DIAGNOSIS — I4891 Unspecified atrial fibrillation: Secondary | ICD-10-CM | POA: Diagnosis not present

## 2020-01-11 DIAGNOSIS — D62 Acute posthemorrhagic anemia: Secondary | ICD-10-CM | POA: Diagnosis not present

## 2020-01-11 DIAGNOSIS — Z992 Dependence on renal dialysis: Secondary | ICD-10-CM | POA: Diagnosis not present

## 2020-01-11 DIAGNOSIS — Z7982 Long term (current) use of aspirin: Secondary | ICD-10-CM | POA: Diagnosis not present

## 2020-01-11 DIAGNOSIS — N186 End stage renal disease: Secondary | ICD-10-CM | POA: Diagnosis not present

## 2020-01-11 DIAGNOSIS — I2510 Atherosclerotic heart disease of native coronary artery without angina pectoris: Secondary | ICD-10-CM | POA: Diagnosis not present

## 2020-01-11 DIAGNOSIS — I509 Heart failure, unspecified: Secondary | ICD-10-CM | POA: Diagnosis not present

## 2020-01-11 DIAGNOSIS — D631 Anemia in chronic kidney disease: Secondary | ICD-10-CM | POA: Diagnosis not present

## 2020-01-11 NOTE — Telephone Encounter (Signed)
He should come in for an eval

## 2020-01-11 NOTE — Telephone Encounter (Signed)
[facility] at [facility] called to let you know that this patient had a resting heart rate of 115-120 today, pt had no other symptoms, they will be keeping a record of his vitals.  [phone]

## 2020-01-13 DIAGNOSIS — N186 End stage renal disease: Secondary | ICD-10-CM | POA: Diagnosis not present

## 2020-01-13 DIAGNOSIS — T8249XA Other complication of vascular dialysis catheter, initial encounter: Secondary | ICD-10-CM | POA: Diagnosis not present

## 2020-01-13 DIAGNOSIS — D631 Anemia in chronic kidney disease: Secondary | ICD-10-CM | POA: Diagnosis not present

## 2020-01-13 DIAGNOSIS — N2581 Secondary hyperparathyroidism of renal origin: Secondary | ICD-10-CM | POA: Diagnosis not present

## 2020-01-13 DIAGNOSIS — I214 Non-ST elevation (NSTEMI) myocardial infarction: Secondary | ICD-10-CM | POA: Diagnosis not present

## 2020-01-13 DIAGNOSIS — R262 Difficulty in walking, not elsewhere classified: Secondary | ICD-10-CM | POA: Diagnosis not present

## 2020-01-13 DIAGNOSIS — E162 Hypoglycemia, unspecified: Secondary | ICD-10-CM | POA: Diagnosis not present

## 2020-01-13 DIAGNOSIS — Z992 Dependence on renal dialysis: Secondary | ICD-10-CM | POA: Diagnosis not present

## 2020-01-13 NOTE — Telephone Encounter (Signed)
It is okay for that date unless we get calls back that he is not doing well or HR is still UP

## 2020-01-15 DIAGNOSIS — E1142 Type 2 diabetes mellitus with diabetic polyneuropathy: Secondary | ICD-10-CM | POA: Diagnosis not present

## 2020-01-15 DIAGNOSIS — I214 Non-ST elevation (NSTEMI) myocardial infarction: Secondary | ICD-10-CM | POA: Diagnosis not present

## 2020-01-15 DIAGNOSIS — N186 End stage renal disease: Secondary | ICD-10-CM | POA: Diagnosis not present

## 2020-01-15 DIAGNOSIS — Z951 Presence of aortocoronary bypass graft: Secondary | ICD-10-CM | POA: Diagnosis not present

## 2020-01-15 DIAGNOSIS — E1122 Type 2 diabetes mellitus with diabetic chronic kidney disease: Secondary | ICD-10-CM | POA: Diagnosis not present

## 2020-01-15 DIAGNOSIS — I132 Hypertensive heart and chronic kidney disease with heart failure and with stage 5 chronic kidney disease, or end stage renal disease: Secondary | ICD-10-CM | POA: Diagnosis not present

## 2020-01-15 DIAGNOSIS — I4891 Unspecified atrial fibrillation: Secondary | ICD-10-CM | POA: Diagnosis not present

## 2020-01-15 DIAGNOSIS — Z9181 History of falling: Secondary | ICD-10-CM | POA: Diagnosis not present

## 2020-01-15 DIAGNOSIS — Z992 Dependence on renal dialysis: Secondary | ICD-10-CM | POA: Diagnosis not present

## 2020-01-15 DIAGNOSIS — D62 Acute posthemorrhagic anemia: Secondary | ICD-10-CM | POA: Diagnosis not present

## 2020-01-15 DIAGNOSIS — D631 Anemia in chronic kidney disease: Secondary | ICD-10-CM | POA: Diagnosis not present

## 2020-01-15 DIAGNOSIS — I509 Heart failure, unspecified: Secondary | ICD-10-CM | POA: Diagnosis not present

## 2020-01-15 DIAGNOSIS — I2510 Atherosclerotic heart disease of native coronary artery without angina pectoris: Secondary | ICD-10-CM | POA: Diagnosis not present

## 2020-01-15 DIAGNOSIS — Z48812 Encounter for surgical aftercare following surgery on the circulatory system: Secondary | ICD-10-CM | POA: Diagnosis not present

## 2020-01-15 DIAGNOSIS — Z7982 Long term (current) use of aspirin: Secondary | ICD-10-CM | POA: Diagnosis not present

## 2020-01-16 DIAGNOSIS — Z992 Dependence on renal dialysis: Secondary | ICD-10-CM | POA: Diagnosis not present

## 2020-01-16 DIAGNOSIS — N2581 Secondary hyperparathyroidism of renal origin: Secondary | ICD-10-CM | POA: Diagnosis not present

## 2020-01-16 DIAGNOSIS — N186 End stage renal disease: Secondary | ICD-10-CM | POA: Diagnosis not present

## 2020-01-16 DIAGNOSIS — D631 Anemia in chronic kidney disease: Secondary | ICD-10-CM | POA: Diagnosis not present

## 2020-01-16 DIAGNOSIS — T8249XA Other complication of vascular dialysis catheter, initial encounter: Secondary | ICD-10-CM | POA: Diagnosis not present

## 2020-01-16 DIAGNOSIS — E162 Hypoglycemia, unspecified: Secondary | ICD-10-CM | POA: Diagnosis not present

## 2020-01-17 DIAGNOSIS — I132 Hypertensive heart and chronic kidney disease with heart failure and with stage 5 chronic kidney disease, or end stage renal disease: Secondary | ICD-10-CM | POA: Diagnosis not present

## 2020-01-17 DIAGNOSIS — Z951 Presence of aortocoronary bypass graft: Secondary | ICD-10-CM | POA: Diagnosis not present

## 2020-01-17 DIAGNOSIS — N186 End stage renal disease: Secondary | ICD-10-CM | POA: Diagnosis not present

## 2020-01-17 DIAGNOSIS — Z7982 Long term (current) use of aspirin: Secondary | ICD-10-CM | POA: Diagnosis not present

## 2020-01-17 DIAGNOSIS — Z931 Gastrostomy status: Secondary | ICD-10-CM | POA: Diagnosis not present

## 2020-01-17 DIAGNOSIS — I509 Heart failure, unspecified: Secondary | ICD-10-CM | POA: Diagnosis not present

## 2020-01-17 DIAGNOSIS — E1122 Type 2 diabetes mellitus with diabetic chronic kidney disease: Secondary | ICD-10-CM | POA: Diagnosis not present

## 2020-01-17 DIAGNOSIS — Z48812 Encounter for surgical aftercare following surgery on the circulatory system: Secondary | ICD-10-CM | POA: Diagnosis not present

## 2020-01-17 DIAGNOSIS — D631 Anemia in chronic kidney disease: Secondary | ICD-10-CM | POA: Diagnosis not present

## 2020-01-17 DIAGNOSIS — I4891 Unspecified atrial fibrillation: Secondary | ICD-10-CM | POA: Diagnosis not present

## 2020-01-17 DIAGNOSIS — D62 Acute posthemorrhagic anemia: Secondary | ICD-10-CM | POA: Diagnosis not present

## 2020-01-17 DIAGNOSIS — Z9181 History of falling: Secondary | ICD-10-CM | POA: Diagnosis not present

## 2020-01-17 DIAGNOSIS — E1142 Type 2 diabetes mellitus with diabetic polyneuropathy: Secondary | ICD-10-CM | POA: Diagnosis not present

## 2020-01-17 DIAGNOSIS — I2510 Atherosclerotic heart disease of native coronary artery without angina pectoris: Secondary | ICD-10-CM | POA: Diagnosis not present

## 2020-01-17 DIAGNOSIS — Z992 Dependence on renal dialysis: Secondary | ICD-10-CM | POA: Diagnosis not present

## 2020-01-17 DIAGNOSIS — I214 Non-ST elevation (NSTEMI) myocardial infarction: Secondary | ICD-10-CM | POA: Diagnosis not present

## 2020-01-18 DIAGNOSIS — E162 Hypoglycemia, unspecified: Secondary | ICD-10-CM | POA: Diagnosis not present

## 2020-01-18 DIAGNOSIS — Z992 Dependence on renal dialysis: Secondary | ICD-10-CM | POA: Diagnosis not present

## 2020-01-18 DIAGNOSIS — T8249XA Other complication of vascular dialysis catheter, initial encounter: Secondary | ICD-10-CM | POA: Diagnosis not present

## 2020-01-18 DIAGNOSIS — N2581 Secondary hyperparathyroidism of renal origin: Secondary | ICD-10-CM | POA: Diagnosis not present

## 2020-01-18 DIAGNOSIS — D631 Anemia in chronic kidney disease: Secondary | ICD-10-CM | POA: Diagnosis not present

## 2020-01-18 DIAGNOSIS — N186 End stage renal disease: Secondary | ICD-10-CM | POA: Diagnosis not present

## 2020-01-19 DIAGNOSIS — N189 Chronic kidney disease, unspecified: Secondary | ICD-10-CM

## 2020-01-19 DIAGNOSIS — I132 Hypertensive heart and chronic kidney disease with heart failure and with stage 5 chronic kidney disease, or end stage renal disease: Secondary | ICD-10-CM | POA: Diagnosis not present

## 2020-01-19 DIAGNOSIS — D631 Anemia in chronic kidney disease: Secondary | ICD-10-CM | POA: Diagnosis not present

## 2020-01-19 DIAGNOSIS — I214 Non-ST elevation (NSTEMI) myocardial infarction: Secondary | ICD-10-CM | POA: Diagnosis not present

## 2020-01-19 DIAGNOSIS — E1142 Type 2 diabetes mellitus with diabetic polyneuropathy: Secondary | ICD-10-CM | POA: Diagnosis not present

## 2020-01-19 DIAGNOSIS — I4891 Unspecified atrial fibrillation: Secondary | ICD-10-CM | POA: Diagnosis not present

## 2020-01-19 DIAGNOSIS — E162 Hypoglycemia, unspecified: Secondary | ICD-10-CM | POA: Insufficient documentation

## 2020-01-19 DIAGNOSIS — Z951 Presence of aortocoronary bypass graft: Secondary | ICD-10-CM | POA: Diagnosis not present

## 2020-01-19 DIAGNOSIS — I509 Heart failure, unspecified: Secondary | ICD-10-CM | POA: Diagnosis not present

## 2020-01-19 DIAGNOSIS — Z9181 History of falling: Secondary | ICD-10-CM | POA: Diagnosis not present

## 2020-01-19 DIAGNOSIS — Z7982 Long term (current) use of aspirin: Secondary | ICD-10-CM | POA: Diagnosis not present

## 2020-01-19 DIAGNOSIS — D62 Acute posthemorrhagic anemia: Secondary | ICD-10-CM | POA: Diagnosis not present

## 2020-01-19 DIAGNOSIS — Z992 Dependence on renal dialysis: Secondary | ICD-10-CM | POA: Diagnosis not present

## 2020-01-19 DIAGNOSIS — N186 End stage renal disease: Secondary | ICD-10-CM | POA: Diagnosis not present

## 2020-01-19 DIAGNOSIS — I2510 Atherosclerotic heart disease of native coronary artery without angina pectoris: Secondary | ICD-10-CM | POA: Diagnosis not present

## 2020-01-19 DIAGNOSIS — E1122 Type 2 diabetes mellitus with diabetic chronic kidney disease: Secondary | ICD-10-CM | POA: Diagnosis not present

## 2020-01-19 DIAGNOSIS — Z48812 Encounter for surgical aftercare following surgery on the circulatory system: Secondary | ICD-10-CM | POA: Diagnosis not present

## 2020-01-19 HISTORY — DX: Chronic kidney disease, unspecified: D63.1

## 2020-01-19 HISTORY — DX: Anemia in chronic kidney disease: N18.9

## 2020-01-20 DIAGNOSIS — Z992 Dependence on renal dialysis: Secondary | ICD-10-CM | POA: Diagnosis not present

## 2020-01-20 DIAGNOSIS — N2581 Secondary hyperparathyroidism of renal origin: Secondary | ICD-10-CM | POA: Diagnosis not present

## 2020-01-20 DIAGNOSIS — E162 Hypoglycemia, unspecified: Secondary | ICD-10-CM | POA: Diagnosis not present

## 2020-01-20 DIAGNOSIS — N186 End stage renal disease: Secondary | ICD-10-CM | POA: Diagnosis not present

## 2020-01-20 DIAGNOSIS — D631 Anemia in chronic kidney disease: Secondary | ICD-10-CM | POA: Diagnosis not present

## 2020-01-20 DIAGNOSIS — T8249XA Other complication of vascular dialysis catheter, initial encounter: Secondary | ICD-10-CM | POA: Diagnosis not present

## 2020-01-22 DIAGNOSIS — I214 Non-ST elevation (NSTEMI) myocardial infarction: Secondary | ICD-10-CM | POA: Diagnosis not present

## 2020-01-22 DIAGNOSIS — I2510 Atherosclerotic heart disease of native coronary artery without angina pectoris: Secondary | ICD-10-CM | POA: Diagnosis not present

## 2020-01-22 DIAGNOSIS — Z951 Presence of aortocoronary bypass graft: Secondary | ICD-10-CM | POA: Diagnosis not present

## 2020-01-22 DIAGNOSIS — Z7982 Long term (current) use of aspirin: Secondary | ICD-10-CM | POA: Diagnosis not present

## 2020-01-22 DIAGNOSIS — I132 Hypertensive heart and chronic kidney disease with heart failure and with stage 5 chronic kidney disease, or end stage renal disease: Secondary | ICD-10-CM | POA: Diagnosis not present

## 2020-01-22 DIAGNOSIS — Z9181 History of falling: Secondary | ICD-10-CM | POA: Diagnosis not present

## 2020-01-22 DIAGNOSIS — E1142 Type 2 diabetes mellitus with diabetic polyneuropathy: Secondary | ICD-10-CM | POA: Diagnosis not present

## 2020-01-22 DIAGNOSIS — I4891 Unspecified atrial fibrillation: Secondary | ICD-10-CM | POA: Diagnosis not present

## 2020-01-22 DIAGNOSIS — Z992 Dependence on renal dialysis: Secondary | ICD-10-CM | POA: Diagnosis not present

## 2020-01-22 DIAGNOSIS — N186 End stage renal disease: Secondary | ICD-10-CM | POA: Diagnosis not present

## 2020-01-22 DIAGNOSIS — Z48812 Encounter for surgical aftercare following surgery on the circulatory system: Secondary | ICD-10-CM | POA: Diagnosis not present

## 2020-01-22 DIAGNOSIS — D631 Anemia in chronic kidney disease: Secondary | ICD-10-CM | POA: Diagnosis not present

## 2020-01-22 DIAGNOSIS — E1122 Type 2 diabetes mellitus with diabetic chronic kidney disease: Secondary | ICD-10-CM | POA: Diagnosis not present

## 2020-01-22 DIAGNOSIS — D62 Acute posthemorrhagic anemia: Secondary | ICD-10-CM | POA: Diagnosis not present

## 2020-01-22 DIAGNOSIS — I509 Heart failure, unspecified: Secondary | ICD-10-CM | POA: Diagnosis not present

## 2020-01-23 DIAGNOSIS — E162 Hypoglycemia, unspecified: Secondary | ICD-10-CM | POA: Diagnosis not present

## 2020-01-23 DIAGNOSIS — N186 End stage renal disease: Secondary | ICD-10-CM | POA: Diagnosis not present

## 2020-01-23 DIAGNOSIS — D631 Anemia in chronic kidney disease: Secondary | ICD-10-CM | POA: Diagnosis not present

## 2020-01-23 DIAGNOSIS — N2581 Secondary hyperparathyroidism of renal origin: Secondary | ICD-10-CM | POA: Diagnosis not present

## 2020-01-23 DIAGNOSIS — Z992 Dependence on renal dialysis: Secondary | ICD-10-CM | POA: Diagnosis not present

## 2020-01-23 DIAGNOSIS — T8249XA Other complication of vascular dialysis catheter, initial encounter: Secondary | ICD-10-CM | POA: Diagnosis not present

## 2020-01-24 ENCOUNTER — Encounter: Payer: Self-pay | Admitting: Cardiology

## 2020-01-24 ENCOUNTER — Ambulatory Visit: Payer: Federal, State, Local not specified - PPO | Admitting: Cardiology

## 2020-01-24 ENCOUNTER — Other Ambulatory Visit: Payer: Self-pay

## 2020-01-24 ENCOUNTER — Encounter: Payer: Self-pay | Admitting: Podiatry

## 2020-01-24 ENCOUNTER — Ambulatory Visit (INDEPENDENT_AMBULATORY_CARE_PROVIDER_SITE_OTHER): Payer: Medicare Other | Admitting: Podiatry

## 2020-01-24 ENCOUNTER — Other Ambulatory Visit: Payer: Self-pay | Admitting: Internal Medicine

## 2020-01-24 VITALS — BP 117/65 | HR 103 | Resp 16 | Ht 67.0 in | Wt 170.0 lb

## 2020-01-24 DIAGNOSIS — M79674 Pain in right toe(s): Secondary | ICD-10-CM | POA: Diagnosis not present

## 2020-01-24 DIAGNOSIS — M79675 Pain in left toe(s): Secondary | ICD-10-CM | POA: Diagnosis not present

## 2020-01-24 DIAGNOSIS — D631 Anemia in chronic kidney disease: Secondary | ICD-10-CM | POA: Diagnosis not present

## 2020-01-24 DIAGNOSIS — D62 Acute posthemorrhagic anemia: Secondary | ICD-10-CM | POA: Diagnosis not present

## 2020-01-24 DIAGNOSIS — I132 Hypertensive heart and chronic kidney disease with heart failure and with stage 5 chronic kidney disease, or end stage renal disease: Secondary | ICD-10-CM | POA: Diagnosis not present

## 2020-01-24 DIAGNOSIS — Z951 Presence of aortocoronary bypass graft: Secondary | ICD-10-CM

## 2020-01-24 DIAGNOSIS — I4891 Unspecified atrial fibrillation: Secondary | ICD-10-CM | POA: Diagnosis not present

## 2020-01-24 DIAGNOSIS — B351 Tinea unguium: Secondary | ICD-10-CM | POA: Diagnosis not present

## 2020-01-24 DIAGNOSIS — N186 End stage renal disease: Secondary | ICD-10-CM

## 2020-01-24 DIAGNOSIS — L600 Ingrowing nail: Secondary | ICD-10-CM

## 2020-01-24 DIAGNOSIS — D689 Coagulation defect, unspecified: Secondary | ICD-10-CM | POA: Diagnosis not present

## 2020-01-24 DIAGNOSIS — Z7982 Long term (current) use of aspirin: Secondary | ICD-10-CM | POA: Diagnosis not present

## 2020-01-24 DIAGNOSIS — I119 Hypertensive heart disease without heart failure: Secondary | ICD-10-CM

## 2020-01-24 DIAGNOSIS — Z48812 Encounter for surgical aftercare following surgery on the circulatory system: Secondary | ICD-10-CM | POA: Diagnosis not present

## 2020-01-24 DIAGNOSIS — Z992 Dependence on renal dialysis: Secondary | ICD-10-CM | POA: Diagnosis not present

## 2020-01-24 DIAGNOSIS — E1142 Type 2 diabetes mellitus with diabetic polyneuropathy: Secondary | ICD-10-CM

## 2020-01-24 DIAGNOSIS — Z9181 History of falling: Secondary | ICD-10-CM | POA: Diagnosis not present

## 2020-01-24 DIAGNOSIS — I509 Heart failure, unspecified: Secondary | ICD-10-CM | POA: Diagnosis not present

## 2020-01-24 DIAGNOSIS — I2510 Atherosclerotic heart disease of native coronary artery without angina pectoris: Secondary | ICD-10-CM

## 2020-01-24 DIAGNOSIS — I214 Non-ST elevation (NSTEMI) myocardial infarction: Secondary | ICD-10-CM | POA: Diagnosis not present

## 2020-01-24 DIAGNOSIS — E1122 Type 2 diabetes mellitus with diabetic chronic kidney disease: Secondary | ICD-10-CM | POA: Diagnosis not present

## 2020-01-24 DIAGNOSIS — E119 Type 2 diabetes mellitus without complications: Secondary | ICD-10-CM

## 2020-01-24 MED ORDER — AMIODARONE HCL 200 MG PO TABS: 100.0000 mg | ORAL_TABLET | Freq: Every day | ORAL | 2 refills | Status: DC

## 2020-01-24 MED ORDER — METOPROLOL SUCCINATE ER 50 MG PO TB24: 50.0000 mg | ORAL_TABLET | Freq: Every day | ORAL | 2 refills | Status: DC

## 2020-01-24 MED ORDER — METOPROLOL SUCCINATE ER 25 MG PO TB24: 25.0000 mg | ORAL_TABLET | Freq: Every day | ORAL | 2 refills | Status: DC

## 2020-01-24 NOTE — Progress Notes (Signed)
Primary Physician/Referring:  Hoyt Koch, MD  Patient ID: [name], male    DOB: 03/14/1946, 74 y.o.   MRN: [id number]  Chief Complaint  Patient presents with  . Coronary Artery Disease  . Hypertension  . Follow-up    4 week    HPI:    Christian [name]  is a 74 y.o. AA male with hypertension, hyperlipidemia, DM with stage 3 CKD,  CAD with  stenting to the PDA on 02/27/2014. Patient presented with unstable angina and NSTEMI on 08/09/2019 along with a very small subsegmental pulmonary embolism and discharged on 10/18/2019 after he underwent CABG x4 on 08/15/2019.  Coronary angiography revealing complex ostial LAD stenosis with TIMI II flow and a calcific 75% ostial circumflex stenosis with reduced LVEF.   He had extremely complex postop complications including cardiogenic shock, cardiac arrest, needing internal CPR in the ICU after opening the chest, A. Fib needing cardioversion, closure office chest wall, developed acute renal failure leading to need for permanent dialysis, right brain infarct with left hemiparesis, multiple blood transfusions.   I had received calls from home health stating that his heart rate has been high.  He now presents for follow-up, I had seen him 1 month ago.  In the interim, metoprolol, diltiazem, was discontinued due to low blood pressure by his nephrologist.  Since then he has been able to tolerate dialysis well and his leg edema has completely resolved.  He has not had any recurrence of angina pectoris.  States that his strength is gradually improving and he has home health now.  Past Medical History:  Diagnosis Date  . BPH (benign prostatic hypertrophy)   . Coronary artery disease   . Diabetes mellitus without complication ([place])   . Dry eyes left  . Hiatal hernia   . 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  LIMA TO LAD, RIMA TO PDA, RADIAL ARTERY TO CIRC AND SEQUENTIALLY TO OM1. 08/15/2019  . Hyperlipidemia   .  Hypertension   . Incomplete bladder emptying   . Nocturia   . 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 SOB (shortness of breath) on exertion    Past Surgical History:  Procedure Laterality Date  . APPLICATION OF WOUND VAC N/A 08/24/2019   Procedure: APPLICATION OF WOUND VAC;  Surgeon: [name], MD;  Location: [facility];  Service: Thoracic;  Laterality: N/A;  . APPLICATION OF WOUND VAC  08/29/2019   Procedure: Wound Vac change;  Surgeon: [name], MD;  Location: [place] [facility];  Service: Open Heart Surgery;;  . APPLICATION OF WOUND VAC N/A 09/04/2019   Procedure: Application Of Wound Vac;  Surgeon: Grace Judson, MD;  Location: [facility];  Service: Open Heart Surgery;  Laterality: N/A;  . APPLICATION OF WOUND VAC N/A 09/06/2019   Procedure: APPLICATION OF ACELL, APPLICATION OF WOUND VAC USING PREVENA INCISIONAL  DRESSING;  Surgeon: Wallace Going, DO;  Location: [place];  Service: Plastics;  Laterality: N/A;  . CARDIAC CATHETERIZATION    . CORONARY ARTERY BYPASS GRAFT N/A 08/15/2019   Procedure: CORONARY ARTERY BYPASS GRAFTING (CABG), x 4 using bilateral IMAs and left radial artery .  LIMA TO LAD, RIMA TO PDA, RADIAL ARTERY TO CIRC AND SEQUENTIALLY TO OM1.;  Surgeon: [name], MD;  Location: [place] [facility];  Service: Open Heart Surgery;  Laterality: N/A;  . CORONARY STENT PLACEMENT  02/27/2014   distal rt/pd coronary       dr Einar Gip  . CYSTO/ BLADDER BIOPSY'S/ CAUTHERIZATION  01-14-2004  DR Gaynelle Arabian  . EXPLORATION POST OPERATIVE OPEN HEART N/A 08/16/2019   Procedure: Chest Closure S?P CABG WITH APPLICATION OF PREVENA  INCISIONAL WOUND VAC;  Surgeon: [name], MD;  Location: [facility];  Service: Open Heart Surgery;  Laterality: N/A;  . EXPLORATION POST OPERATIVE OPEN HEART N/A 08/21/2019   Procedure: CHEST WASHOUT S/P OPEN CHEST;  Surgeon: [name], MD;  Location: [place];   Service: Open Heart Surgery;  Laterality: N/A;  Open chest with Esmark dressing with Ioban sealant coverage.  . EXPLORATION POST OPERATIVE OPEN HEART N/A 08/18/2019   Procedure: EXPLORATION POST OPERATIVE OPEN HEART (performed 04/23 on unit);  Surgeon: [name], MD;  Location: [place] [facility];  Service: Open Heart Surgery;  Laterality: N/A;  . EXPLORATION POST OPERATIVE OPEN HEART N/A 08/24/2019   Procedure: CHEST WASHOUT POST OPERATIVE OPEN HEART;  Surgeon: [name], MD;  Location: [place];  Service: Open Heart Surgery;  Laterality: N/A;  . EXPLORATION POST OPERATIVE OPEN HEART N/A 08/29/2019   Procedure: CHEST WOUND WASHOUT POST OPERATIVE OPEN HEART;  Surgeon: [name], MD;  Location: [place];  Service: Open Heart Surgery;  Laterality: N/A;  . EXPLORATION POST OPERATIVE OPEN HEART N/A 09/04/2019   Procedure: MEDIASTINAL EXPLORATION WITH STERNAL WOUND IRRIGATION;  Surgeon: Grace Mandell, MD;  Location: [facility];  Service: Open Heart Surgery;  Laterality: N/A;  . EXPLORATION POST OPERATIVE OPEN HEART N/A 09/14/2019   Procedure: EVACUATION OF HEMATOMA;  Surgeon: Grace Isaiyah, MD;  Location: [facility];  Service: Open Heart Surgery;  Laterality: N/A;  . IR FLUORO GUIDE CV LINE LEFT  09/19/2019  . IR GASTROSTOMY TUBE MOD SED  10/04/2019  . IR GASTROSTOMY TUBE REMOVAL  11/29/2019  . IR PATIENT EVAL TECH 0-60 MINS  09/29/2019  . IR US GUIDE VASC ACCESS LEFT  09/19/2019  . LAPAROSCOPIC LYSIS OF ADHESIONS N/A 09/06/2019   Procedure: LAPAROSCOPIC OMENTAL HARVEST;  Surgeon: Kinsinger, Arta Bruce, MD;  Location: [facility];  Service: General;  Laterality: N/A;  . LEFT HEART CATH AND CORONARY ANGIOGRAPHY N/A 08/10/2019   Procedure: LEFT HEART CATH AND CORONARY ANGIOGRAPHY;  Surgeon: Nigel Mormon, MD;  Location: [facility];  Service: Cardiovascular;  Laterality: N/A;  . LEFT HEART CATHETERIZATION WITH CORONARY ANGIOGRAM N/A 02/27/2014   Procedure: LEFT HEART CATHETERIZATION WITH CORONARY  ANGIOGRAM;  Surgeon: Laverda Page, MD;  Location: [facility] CATH LAB;  Service: Cardiovascular;  Laterality: N/A;  . MEDIASTINAL EXPLORATION N/A 09/06/2019   Procedure: MEDIASTINAL EXPLORATION;  Surgeon: Grace Harjit, MD;  Location: [facility];  Service: Thoracic;  Laterality: N/A;  . PECTORALIS FLAP  09/06/2019   Procedure: Pectoralis ADVANCEMENT Flap;  Surgeon: Wallace Going, DO;  Location: [facility];  Service: Plastics;;  . PERCUTANEOUS CORONARY STENT INTERVENTION (PCI-S)  02/27/2014   Procedure: PERCUTANEOUS CORONARY STENT INTERVENTION (PCI-S);  Surgeon: Laverda Page, MD;  Location: [facility] CATH LAB;  Service: Cardiovascular;;  rt PDA  3.0/28mm Promus stent  . RADIAL ARTERY HARVEST Left 08/15/2019   Procedure: Radial Artery Harvest;  Surgeon: [name], MD;  Location: [facility] [place];  Service: Open Heart Surgery;  Laterality: Left;  . RIB PLATING N/A 09/06/2019   Procedure: STERNAL PLATING;  Surgeon: Grace Aariv, MD;  Location: [facility];  Service: Thoracic;  Laterality:  N/A;  . TEE WITHOUT CARDIOVERSION N/A 08/15/2019   Procedure: TRANSESOPHAGEAL ECHOCARDIOGRAM (TEE);  Surgeon: [name], MD;  Location: [place];  Service: Open Heart Surgery;  Laterality: N/A;  . TRACHEOSTOMY TUBE PLACEMENT  08/29/2019   Procedure: Tracheostomy;  Surgeon: [name], MD;  Location: [facility];  Service: Open Heart Surgery;;  . TRANSURETHRAL RESECTION OF PROSTATE  04/04/2012   Procedure: TRANSURETHRAL RESECTION OF THE PROSTATE WITH GYRUS INSTRUMENTS;  Surgeon: Ailene Rud, MD;  Location: [facility];  Service: Urology;  Laterality: N/A;  . TRANSURETHRAL RESECTION OF PROSTATE N/A 09/27/2014   Procedure: TRANSURETHRAL RESECTION OF THE PROSTATE ;  Surgeon: Carolan Clines, MD;  Location: [facility];  Service: Urology;  Laterality: N/A;  . UPPER GASTROINTESTINAL ENDOSCOPY     Social History   Tobacco Use  . Smoking status: Former Smoker    Packs/day: 0.50    Years: 20.00    Pack  years: 10.00    Types: Cigarettes    Quit date: 1975    Years since quitting: 46.7  . Smokeless tobacco: Never Used  Substance Use Topics  . Alcohol use: Not Currently    Alcohol/week: 0.0 standard drinks    Comment: 1 beer week rarely   ROS   Review of Systems  Cardiovascular: Negative for chest pain, dyspnea on exertion and leg swelling.  Gastrointestinal: Negative for melena.  Neurological: Positive for disturbances in coordination (left sided weakness) and loss of balance.     Objective  Blood pressure 117/65, pulse (!) 103, resp. rate 16, height 5\' 7"  (1.702 m), weight 170 lb (77.1 kg), SpO2 97 %.  Vitals with BMI 01/24/2020 12/22/2019 12/22/2019  Height 5\' 7"  - 5\' 7"   Weight 170 lbs - 170 lbs  BMI 16.10 - 96.04  Systolic 540 981 80  Diastolic 65 55 48  Pulse 191 72 70    Physical Exam Constitutional:      Comments: He is moderately built and mildly obese in no acute distress.  Neck:     Thyroid: No thyromegaly.  Cardiovascular:     Rate and Rhythm: Normal rate and regular rhythm.     Pulses:          Carotid pulses are 2+ on the right side and 2+ on the left side.      Femoral pulses are 2+ on the right side and 2+ on the left side.      Popliteal pulses are 0 on the right side and 0 on the left side.       Dorsalis pedis pulses are 0 on the right side and 0 on the left side.       Posterior tibial pulses are 0 on the right side and 0 on the left side.     Heart sounds: Normal heart sounds. No murmur heard.  No gallop.      Comments: NO pedal edema, no JVD.  Pulmonary:     Effort: Pulmonary effort is normal.     Breath sounds: Normal breath sounds.  Chest:     Comments: Sternotomy scar has healed well Abdominal:     General: Bowel sounds are normal.     Palpations: Abdomen is soft.     Laboratory examination:   Recent Labs    10/16/19 0232 10/16/19 0232 10/17/19 0254 10/18/19 0046 12/20/19 1312  NA 131*   < > 131* 132* 136  K 3.9   < > 3.9 3.9 3.7    CL 94*   < >  95* 96* 97*  CO2 24   < > 21* 23 33*  GLUCOSE 84   < > 187* 175* 92  BUN 65*   < > 83* 37* 12  CREATININE 5.27*   < > 6.48* 3.55* 2.87*  CALCIUM 9.2   < > 9.0 9.1 9.0  GFRNONAA 10*  --  8* 16*  --   GFRAA 12*  --  9* 19*  --    < > = values in this interval not displayed.   CrCl cannot be calculated (Patient's most recent lab result is older than the maximum 21 days allowed.).  CMP Latest Ref Rng & Units 12/20/2019 10/18/2019 10/17/2019  Glucose 65 - 99 mg/dL 92 175(H) 187(H)  BUN 7 - 25 mg/dL 12 37(H) 83(H)  Creatinine 0.70 - 1.18 mg/dL 2.87(H) 3.55(H) 6.48(H)  Sodium 135 - 146 mmol/L 136 132(L) 131(L)  Potassium 3.5 - 5.3 mmol/L 3.7 3.9 3.9  Chloride 98 - 110 mmol/L 97(L) 96(L) 95(L)  CO2 20 - 32 mmol/L 33(H) 23 21(L)  Calcium 8.6 - 10.3 mg/dL 9.0 9.1 9.0  Total Protein 6.1 - 8.1 g/dL 5.7(L) - -  Total Bilirubin 0.2 - 1.2 mg/dL 0.4 - -  Alkaline Phos 38 - 126 U/L - - -  AST 10 - 35 U/L 21 - -  ALT 9 - 46 U/L 10 - -   CBC Latest Ref Rng & Units 10/17/2019 10/14/2019 10/12/2019  WBC 4.0 - 10.5 K/uL 12.3(H) 14.2(H) 14.6(H)  Hemoglobin 13.0 - 17.0 g/dL 9.2(L) 9.2(L) 9.3(L)  Hematocrit 39 - 52 % 29.1(L) 30.2(L) 30.3(L)  Platelets 150 - 400 K/uL 346 377 369   Lipid Panel Recent Labs    08/11/19 0426 08/15/19 1958 08/24/19 2003 08/26/19 1028 08/27/19 1611  CHOL 107  --   --   --   --   TRIG 125   < > 117 131 71  LDLCALC 40  --   --   --   --   VLDL 25  --   --   --   --   HDL 42  --   --   --   --   CHOLHDL 2.5  --   --   --   --    < > = values in this interval not displayed.    HEMOGLOBIN A1C Lab Results  Component Value Date   HGBA1C 4.9 12/20/2019   MPG 94 12/20/2019   TSH Recent Labs    04/19/19 0911  TSH 2.590   Medications and allergies  No Known Allergies   Current Outpatient Medications  Medication Instructions  . acetaminophen (TYLENOL) 650 mg, Oral, Every 6 hours PRN  . aspirin EC 81 mg, Oral, Daily  . atorvastatin (LIPITOR) 20 MG  tablet TAKE 1 TABLET BY MOUTH EVERY DAY IN THE EVENING  . clonazePAM (KLONOPIN) 0.5 mg, Oral, 2 times daily  . clopidogrel (PLAVIX) 75 mg, Oral, Daily  . cromolyn (OPTICROM) 4 % ophthalmic solution SMARTSIG:1 Drop(s) In Eye(s) Every 6-12 Hours PRN  . metoprolol succinate (TOPROL-XL) 25 mg, Oral, Daily, Take with or immediately following a meal.  . multivitamin (RENA-VIT) TABS tablet 1 tablet, Oral, Daily at bedtime  . nitroGLYCERIN (NITROSTAT) 0.4 mg, Sublingual, As directed  . Nystatin (GERHARDT'S BUTT CREAM) CREA 1 application, Topical, 4 times daily PRN  . oxyCODONE (OXY IR/ROXICODONE) 5 mg, Oral, Every 6 hours PRN  . PREVIDENT 5000 BOOSTER PLUS 1.1 % PSTE 1 application, Oral, Daily  . senna-docusate (SENNA-PLUS) 8.6-50  MG tablet 1 tablet, Oral, Daily  . sertraline (ZOLOFT) 25 MG tablet TAKE 1 TABLET ONE TIME A DAY  . sevelamer carbonate (RENVELA) 1.6 g, Oral, 3 times daily with meals  . silodosin (RAPAFLO) 8 mg, Oral, Daily at bedtime  . tamsulosin (FLOMAX) 0.4 mg, Oral, Daily after breakfast  . Trospium Chloride 60 MG CP24 1 capsule, Oral, Daily   Radiology:  No results found.  Cardiac Studies:   Heart catheterization 08/23/19: LM: Normal LAD: Ostial 95% stenosis Ramus: Ostial/prox 30% stenosis LCx: Ostial 75% stenosis RCA: Prox-mid diffuse 40% calcific disease. Distal RCA 70% ISR of 3.0 by 28 mm Promus placed 02/27/2014.  Pre-CABG Dopplers including ABI and carotid duplex: No significant carotid artery stenosis.  Antegrade vertebral artery flow. ABI normal, with triphasic waveforms at the level of the ankle.  Hx of CABG 08/15/2019: LIMA TO LAD, RIMA TO PDA, SEQUENTIAL RADIAL ARTERY TO Cx-OM1.  Echocardiogram 08/19/2019:  1. There prominent respiratory displacement of the interventricular  septum, consistent with enhanced ventricular interdependence, likely due  to constrictive physiology.. Left ventricular ejection fraction, by  estimation, is 45 to 50%. The left ventricle   has mildly decreased function. The left ventricle demonstrates global  hypokinesis. Left ventricular diastolic parameters are consistent with  Grade II diastolic dysfunction (pseudonormalization). Elevated left atrial  pressure.  2. Right ventricular systolic function was not well visualized. The right  ventricular size is normal. Tricuspid regurgitation signal is inadequate  for assessing PA pressure.  3. No pericardial fluid is see, but the pericardium is very thick and  thrombus in the pericardial space cannot be ecluded.   EKG:    EKG 01/16/2020: Normal sinus rhythm/sinus tachycardia at rate of 110 bpm, normal axis, incomplete right bundle branch block.  No evidence of ischemia otherwise normal EKG.    Assessment     ICD-10-CM   1. Atherosclerosis of native coronary artery of native heart without angina pectoris  I25.10 EKG 12-Lead    metoprolol succinate (TOPROL-XL) 25 MG 24 hr tablet    DISCONTINUED: metoprolol succinate (TOPROL-XL) 50 MG 24 hr tablet  2. Hx of CABG 08/15/2019: LIMA TO LAD, RIMA TO PDA, SEQUENTIAL RADIAL ARTERY TO Cx-OM1.  Z95.1 DISCONTINUED: amiodarone (PACERONE) 200 MG tablet  3. ESRD on hemodialysis (HCC)  N18.6    Z99.2   4. Hypertension with heart disease  I11.9     EKG 06/01/2019: Normal sinus rhythm at the rate of 98 bpm, leftward enlargement, normal axis.  No evidence of ischemia.   No significant change from  EKG 04/18/2019.    Recommendations:   Meds ordered this encounter  Medications  . DISCONTD: metoprolol succinate (TOPROL-XL) 50 MG 24 hr tablet    Sig: Take 1 tablet (50 mg total) by mouth daily. Take with or immediately following a meal.    Dispense:  30 tablet    Refill:  2  . DISCONTD: amiodarone (PACERONE) 200 MG tablet    Sig: Take 0.5 tablets (100 mg total) by mouth daily.    Dispense:  30 tablet    Refill:  2  . metoprolol succinate (TOPROL-XL) 25 MG 24 hr tablet    Sig: Take 1 tablet (25 mg total) by mouth daily. Take with or  immediately following a meal.    Dispense:  30 tablet    Refill:  2    Medications Discontinued During This Encounter  Medication Reason  . metoprolol (TOPROL-XL) 200 MG 24 hr tablet Discontinued by provider  . furosemide (LASIX) 40 MG  tablet Discontinued by provider  . verapamil (VERELAN PM) 240 MG 24 hr capsule Discontinued by provider  . amiodarone (PACERONE) 200 MG tablet   . metoprolol succinate (TOPROL-XL) 50 MG 24 hr tablet   . amiodarone (PACERONE) 200 MG tablet Completed Course  The medications were discontinued by nephrology and also primary care physician due to low heart rate and difficulty with dialysis.   Christian [name]  is a 74 y.o. AA male with hypertension, hyperlipidemia, DM with stage 3 CKD,  CAD with  stenting to the PDA on 02/27/2014. Patient presented with unstable angina and NSTEMI on 08/09/2019 along with a very small subsegmental pulmonary embolism and discharged on 10/18/2019 after he underwent CABG x4 on 08/15/2019.  Cholangiography revealing complex ostial LAD stenosis with TIMI II flow and a calcific 75% ostial circumflex stenosis with reduced LVEF.   He had extremely complex postop complications including cardiogenic shock, cardiac arrest, needing internal CPR in the ICU after opening the chest, A. Fib needing cardioversion, closure office chest wall, developed acute renal failure leading to need for permanent dialysis, right brain infarct with left hemiparesis, multiple blood transfusions.  He has recuperated well, he has not had any further difficulty with dialysis as his medications were discontinued.  I did receive a call regarding elevated heart rate by visiting nurse and it is due to patient not being on metoprolol and verapamil.  The above medications were discontinued and I had no knowledge.  No clinical evidence of heart failure, he is tachycardic and I suspect it is due to withdrawal of 3 - chronotropic agents.  In view of underlying coronary artery disease I  would like him to be on low-dose beta-blocker at metoprolol succinate 25 mg daily which she will hold on the days of dialysis.  Also it appears that his renal function is now improving and he may be able to come off of dialysis.  He is maintaining sinus rhythm without recurrence of atrial fibrillation.  Postop atrial fibrillation is now resolved.  I will see him back in 6 weeks for follow-up.  If he remains stable I will see him back in 3 months.    Adrian Prows, MD, [facility] 01/24/2020, 3:44 PM Office: [phone]

## 2020-01-24 NOTE — Progress Notes (Signed)
ANNUAL DIABETIC FOOT EXAM  Subjective: Christian [name] presents today for for annual diabetic foot examination, at risk foot care with h/o NIDDM with ESRD on hemodialysis and painful thick toenails that are difficult to trim. Pain interferes with ambulation. Aggravating factors include wearing enclosed shoe gear. Pain is relieved with periodic professional debridement.   Patient's wife is present during today's visit. They state since his last visit here, he had a heart attack, stroke and is now on dialysis. He was hospitalized and subsequently went to rehab. He is home now. He states he is feeling better now.  Patient denies any h/o foot wounds.  Last A1c: Hemoglobin A1C Latest Ref Rng & Units 12/20/2019 08/14/2019 07/24/2019 04/19/2019  HGBA1C <5.7 % of total Hgb 4.9 7.7(H) 7.7(H) 7.3(H)  Some recent data might be hidden    . Christian Koch, MD is patient's PCP. Last visit 12/20/2019.  Past Medical History:  Diagnosis Date  . BPH (benign prostatic hypertrophy)   . Coronary artery disease   . Diabetes mellitus without complication ([place])   . Dry eyes left  . Hiatal hernia   . 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  LIMA TO LAD, RIMA TO PDA, RADIAL ARTERY TO CIRC AND SEQUENTIALLY TO OM1. 08/15/2019  . Hyperlipidemia   . Hypertension   . Incomplete bladder emptying   . Nocturia   . 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 SOB (shortness of breath) on exertion     Patient Active Problem List   Diagnosis Date Noted  . 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  . Hypoglycemia, unspecified 01/19/2020  . End stage renal disease ([place]) 12/07/2019  . Pruritus, unspecified 11/28/2019  . Unspecified protein-calorie malnutrition ([place]) 10/27/2019  . Allergy, unspecified, initial encounter 10/17/2019  . Anaphylactic shock, unspecified, initial  encounter 10/17/2019  . Complication of vascular dialysis catheter 10/17/2019  . Iron deficiency anemia, unspecified 10/17/2019  . Other specified coagulation defects ([place]) 10/17/2019  . Other acute kidney failure ([place]) 10/17/2019  . Secondary hyperparathyroidism of renal origin ([place]) 10/17/2019  . Type 2 diabetes mellitus with diabetic neuropathy, unspecified ([place]) 10/17/2019  . Acute hypoxemic respiratory failure ([place])   . Pressure injury of skin 08/28/2019  . Coronary artery disease 08/15/2019  . 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  LIMA TO LAD, RIMA TO PDA, RADIAL ARTERY TO CIRC AND SEQUENTIALLY TO OM1. 08/15/2019  . Abdominal pain   . Ischemic cardiomyopathy   . Swelling of lower extremity   . History of coronary artery stent placement   . Elevated troponin I level 08/09/2019  . Pulmonary embolism ([place]) 08/09/2019  . AKI (acute kidney injury) ([place]) 08/09/2019  . Macrocytosis 08/09/2019  . Elevated liver enzymes 08/09/2019  . Non-ST elevation (NSTEMI) myocardial infarction ([place]) 08/09/2019  . Type 2 diabetes mellitus without complication, without long-term current use of insulin ([place]) 07/24/2019  . Obesity (BMI 30-39.9) 12/02/2018  . Left leg weakness 11/23/2018  . CAD (coronary artery disease) 07/30/2018  . Fatigue 10/19/2017  . Routine general medical examination at a health care facility 08/30/2016  . Diabetes mellitus with polyneuropathy ([place]) 10/10/2014  . Benign prostatic hyperplasia 09/27/2014  . Stable angina pectoris ([place]) 02/27/2014  . S/P PTCA (percutaneous transluminal coronary angioplasty) 02/27/2014  . Other chest pain 02/25/2014  . HLD (hyperlipidemia)  11/12/2009  . MULTIPLE SCLEROSIS 11/12/2009  . HTN (hypertension) 11/12/2009    Past Surgical History:  Procedure Laterality Date  . APPLICATION OF WOUND VAC N/A 08/24/2019   Procedure: APPLICATION OF WOUND VAC;  Surgeon: [name], MD;  Location: [place];  Service:  Thoracic;  Laterality: N/A;  . APPLICATION OF WOUND VAC  08/29/2019   Procedure: Wound Vac change;  Surgeon: [name], MD;  Location: [place] [facility];  Service: Open Heart Surgery;;  . APPLICATION OF WOUND VAC N/A 09/04/2019   Procedure: Application Of Wound Vac;  Surgeon: Grace Jove, MD;  Location: [place];  Service: Open Heart Surgery;  Laterality: N/A;  . APPLICATION OF WOUND VAC N/A 09/06/2019   Procedure: APPLICATION OF ACELL, APPLICATION OF WOUND VAC USING PREVENA INCISIONAL  DRESSING;  Surgeon: Wallace Going, DO;  Location: [place];  Service: Plastics;  Laterality: N/A;  . CARDIAC CATHETERIZATION    . CORONARY ARTERY BYPASS GRAFT N/A 08/15/2019   Procedure: CORONARY ARTERY BYPASS GRAFTING (CABG), x 4 using bilateral IMAs and left radial artery .  LIMA TO LAD, RIMA TO PDA, RADIAL ARTERY TO CIRC AND SEQUENTIALLY TO OM1.;  Surgeon: [name], MD;  Location: [place];  Service: Open Heart Surgery;  Laterality: N/A;  . CORONARY STENT PLACEMENT  02/27/2014   distal rt/pd coronary       dr Einar Gip  . CYSTO/ BLADDER BIOPSY'S/ CAUTHERIZATION  01-14-2004  DR Gaynelle Arabian  . EXPLORATION POST OPERATIVE OPEN HEART N/A 08/16/2019   Procedure: Chest Closure S?P CABG WITH APPLICATION OF PREVENA  INCISIONAL WOUND VAC;  Surgeon: [name], MD;  Location: [facility];  Service: Open Heart Surgery;  Laterality: N/A;  . EXPLORATION POST OPERATIVE OPEN HEART N/A 08/21/2019   Procedure: CHEST WASHOUT S/P OPEN CHEST;  Surgeon: [name], MD;  Location: [place];  Service: Open Heart Surgery;  Laterality: N/A;  Open chest with Esmark dressing with Ioban sealant coverage.  . EXPLORATION POST OPERATIVE OPEN HEART N/A 08/18/2019   Procedure: EXPLORATION POST OPERATIVE OPEN HEART (performed 04/23 on unit);  Surgeon: [name], MD;  Location: [place];  Service: Open Heart Surgery;  Laterality: N/A;  . EXPLORATION POST OPERATIVE OPEN HEART N/A 08/24/2019   Procedure: CHEST WASHOUT POST OPERATIVE OPEN  HEART;  Surgeon: [name], MD;  Location: [place];  Service: Open Heart Surgery;  Laterality: N/A;  . EXPLORATION POST OPERATIVE OPEN HEART N/A 08/29/2019   Procedure: CHEST WOUND WASHOUT POST OPERATIVE OPEN HEART;  Surgeon: [name], MD;  Location: [facility];  Service: Open Heart Surgery;  Laterality: N/A;  . EXPLORATION POST OPERATIVE OPEN HEART N/A 09/04/2019   Procedure: MEDIASTINAL EXPLORATION WITH STERNAL WOUND IRRIGATION;  Surgeon: Grace Helmut, MD;  Location: [facility];  Service: Open Heart Surgery;  Laterality: N/A;  . EXPLORATION POST OPERATIVE OPEN HEART N/A 09/14/2019   Procedure: EVACUATION OF HEMATOMA;  Surgeon: Grace Brayant, MD;  Location: [place];  Service: Open Heart Surgery;  Laterality: N/A;  . IR FLUORO GUIDE CV LINE LEFT  09/19/2019  . IR GASTROSTOMY TUBE MOD SED  10/04/2019  . IR GASTROSTOMY TUBE REMOVAL  11/29/2019  . IR PATIENT EVAL TECH 0-60 MINS  09/29/2019  . IR US GUIDE VASC ACCESS LEFT  09/19/2019  . LAPAROSCOPIC LYSIS OF ADHESIONS N/A 09/06/2019   Procedure: LAPAROSCOPIC OMENTAL HARVEST;  Surgeon: Kinsinger, Arta Bruce, MD;  Location: [place];  Service: General;  Laterality: N/A;  . LEFT HEART  CATH AND CORONARY ANGIOGRAPHY N/A 08/10/2019   Procedure: LEFT HEART CATH AND CORONARY ANGIOGRAPHY;  Surgeon: Nigel Mormon, MD;  Location: [place] CV [facility];  Service: Cardiovascular;  Laterality: N/A;  . LEFT HEART CATHETERIZATION WITH CORONARY ANGIOGRAM N/A 02/27/2014   Procedure: LEFT HEART CATHETERIZATION WITH CORONARY ANGIOGRAM;  Surgeon: Laverda Page, MD;  Location: [facility] CATH LAB;  Service: Cardiovascular;  Laterality: N/A;  . MEDIASTINAL EXPLORATION N/A 09/06/2019   Procedure: MEDIASTINAL EXPLORATION;  Surgeon: Grace Aiken, MD;  Location: [place];  Service: Thoracic;  Laterality: N/A;  . PECTORALIS FLAP  09/06/2019   Procedure: Pectoralis ADVANCEMENT Flap;  Surgeon: Wallace Going, DO;  Location: [place];  Service: Plastics;;  . PERCUTANEOUS  CORONARY STENT INTERVENTION (PCI-S)  02/27/2014   Procedure: PERCUTANEOUS CORONARY STENT INTERVENTION (PCI-S);  Surgeon: Laverda Page, MD;  Location[facility] C[facility];  Service: Cardiovascular;;  rt PDA  3.0/28mm Promus stent  . RADIAL ARTERY HARVEST Left 08/15/2019   Procedure: Radial Artery Harvest;  Surgeon: [name], MD;  Location: [place];  Service: Open Heart Surgery;  Laterality: Left;  . RIB PLATING N/A 09/06/2019   Procedure: STERNAL PLATING;  Surgeon: Grace Mitsuo, MD;  Location: [place];  Service: Thoracic;  Laterality: N/A;  . TEE WITHOUT CARDIOVERSION N/A 08/15/2019   Procedure: TRANSESOPHAGEAL ECHOCARDIOGRAM (TEE);  Surgeon: [name], MD;  Location: [place];  Service: Open Heart Surgery;  Laterality: N/A;  . TRACHEOSTOMY TUBE PLACEMENT  08/29/2019   Procedure: Tracheostomy;  Surgeon: [name], MD;  Location: [place] OR;  Service: Open Heart Surgery;;  . TRANSURETHRAL RESECTION OF PROSTATE  04/04/2012   Procedure: TRANSURETHRAL RESECTION OF THE PROSTATE WITH GYRUS INSTRUMENTS;  Surgeon: Ailene Rud, MD;  Location: [place];  Service: Urology;  Laterality: N/A;  . TRANSURETHRAL RESECTION OF PROSTATE N/A 09/27/2014   Procedure: TRANSURETHRAL RESECTION OF THE PROSTATE ;  Surgeon: Carolan Clines, MD;  Location: [facility];  Service: Urology;  Laterality: N/A;  . UPPER GASTROINTESTINAL ENDOSCOPY      Current Outpatient Medications on File Prior to Visit  Medication Sig Dispense Refill  . acetaminophen (TYLENOL) 325 MG tablet Take 2 tablets (650 mg total) by mouth every 6 (six) hours as needed for mild pain or fever.    Marland Kitchen amiodarone (PACERONE) 200 MG tablet TAKE 1 TABLET ONE TIME A DAY FOR ABNORMAL HEART RHYTHM 30 tablet 2  . aspirin EC 81 MG tablet Take 81 mg by mouth daily.    Marland Kitchen atorvastatin (LIPITOR) 20 MG tablet TAKE 1 TABLET BY MOUTH EVERY DAY IN THE EVENING (Patient taking differently: Take 20 mg by mouth every evening. ) 90 tablet 3  .  clonazePAM (KLONOPIN) 0.5 MG disintegrating tablet Take 1 tablet (0.5 mg total) by mouth 2 (two) times daily. 60 tablet 0  . clopidogrel (PLAVIX) 75 MG tablet Take 1 tablet (75 mg total) by mouth daily. 90 tablet 3  . cromolyn (OPTICROM) 4 % ophthalmic solution SMARTSIG:1 Drop(s) In Eye(s) Every 6-12 Hours PRN    . furosemide (LASIX) 40 MG tablet Take 0.5 tablets (20 mg total) by mouth every morning. 30 tablet 2  . metoprolol (TOPROL-XL) 200 MG 24 hr tablet Take 0.5 tablets (100 mg total) by mouth daily. 90 tablet 1  . multivitamin (RENA-VIT) TABS tablet Take 1 tablet by mouth at bedtime.  0  . nitroGLYCERIN (NITROSTAT) 0.4 MG SL tablet Place 0.4 mg under the tongue as directed.    Marland Kitchen Nystatin (  GERHARDT'S BUTT CREAM) CREA Apply 1 application topically 4 (four) times daily as needed for irritation.    Marland Kitchen oxyCODONE (OXY IR/ROXICODONE) 5 MG immediate release tablet Take 1 tablet (5 mg total) by mouth every 6 (six) hours as needed for severe pain. 28 tablet 0  . PREVIDENT 5000 BOOSTER PLUS 1.1 % PSTE Take 1 application by mouth daily.     Marland Kitchen senna-docusate (SENNA-PLUS) 8.6-50 MG tablet Take 1 tablet by mouth daily.    . sevelamer carbonate (RENVELA) 0.8 g PACK packet Take 1.6 g by mouth 3 (three) times daily with meals. 270 each   . silodosin (RAPAFLO) 8 MG CAPS capsule Take 8 mg by mouth at bedtime.    . tamsulosin (FLOMAX) 0.4 MG CAPS capsule Take 0.4 mg by mouth daily after breakfast.     . Trospium Chloride 60 MG CP24 Take 1 capsule by mouth daily.    . verapamil (VERELAN PM) 240 MG 24 hr capsule Take 1 capsule by mouth daily.     No current facility-administered medications on file prior to visit.     No Known Allergies  Social History   Occupational History  . Not on file  Tobacco Use  . Smoking status: Former Smoker    Packs/day: 0.50    Years: 20.00    Pack years: 10.00    Types: Cigarettes    Quit date: 1975    Years since quitting: 46.7  . Smokeless tobacco: Never Used  Vaping  Use  . Vaping Use: Never used  Substance and Sexual Activity  . Alcohol use: Yes    Alcohol/week: 0.0 standard drinks    Comment: 1 beer week rarely  . Drug use: No  . Sexual activity: Not Currently    Family History  Problem Relation Age of Onset  . Diabetes Mother   . Hypertension Mother   . Diabetes Father   . Diabetes Brother   . Hypertension Brother   . Diabetes Brother     Immunization History  Administered Date(s) Administered  . Influenza,inj,Quad PF,6+ Mos 02/28/2014, 02/11/2015  . Influenza-Unspecified 03/11/2017  . Pneumococcal Conjugate-13 08/13/2015  . Pneumococcal Polysaccharide-23 02/28/2014  . Tdap 08/13/2015     Objective: There were no vitals filed for this visit.  Christian [name] is a pleasant 75 y.o. African American male in NAD. AAO X 3.  Vascular Examination: Capillary refill time to digits immediate b/l. Palpable PT pulse(s) b/l lower extremities Faintly palpable DP pulse(s) b/l lower extremities. Pedal hair sparse. Lower extremity skin temperature gradient within normal limits. Trace edema noted b/l lower extremities. No ischemia or gangrene noted b/l lower extremities.  Dermatological Examination: Pedal skin with normal turgor, texture and tone bilaterally. No open wounds bilaterally. No interdigital macerations bilaterally. Toenails 2-5 b/l and right hallux elongated, discolored, dystrophic, thickened, crumbly with subungual debris and tenderness to dorsal palpation. Incurvated nailplate medial and lateral border(s) L 3rd toe and R 3rd toe.  Nail border hypertrophy present. There is tenderness to palpation. Sign(s) of infection: no clinical signs of infection noted on examination today.  Anonychia left great toe. Nailbed epithelialized.  Musculoskeletal Examination: Normal muscle strength 5/5 to all lower extremity muscle groups bilaterally. No pain crepitus or joint limitation noted with ROM b/l. No gross bony deformities bilaterally. Utilizes  wheelchair for mobility assistance.  Footwear Assessment: Does the patient wear appropriate shoes? Yes. Does the patient need inserts/orthotics? No.  Neurological Examination: Protective sensation diminished with 10g monofilament b/l. Vibratory sensation diminished b/l.  Hemoglobin A1C  Latest Ref Rng & Units 12/20/2019 08/14/2019 07/24/2019 04/19/2019  HGBA1C <5.7 % of total Hgb 4.9 7.7(H) 7.7(H) 7.3(H)  Some recent data might be hidden   Assessment: 1. Pain due to onychomycosis of toenails of both feet   2. Ingrown toenail without infection   3. 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 Diabetic polyneuropathy associated with type 2 diabetes mellitus ([place])   5. Encounter for diabetic foot exam ([place])    ADA Risk Categorization: High Risk  Patient has one or more of the following: Loss of protective sensation Absent pedal pulses Severe Foot deformity History of foot ulcer  Plan: -Examined patient. -Diabetic foot examination performed on today's visit. -Toenails 2-5 bilaterally and R hallux debrided in length and girth without iatrogenic bleeding with sterile nail nipper and dremel.  -Patient to report any pedal injuries to medical professional immediately. -Patient to apply Neosporin Cream to L 3rd toe and R 3rd toe once daily for one week. -Patient to continue soft, supportive shoe gear daily. -Patient/POA to call should there be question/concern in the interim.  Return in about 3 months (around 04/24/2020).  Marzetta Board, DPM

## 2020-01-25 DIAGNOSIS — Z992 Dependence on renal dialysis: Secondary | ICD-10-CM | POA: Diagnosis not present

## 2020-01-25 DIAGNOSIS — D631 Anemia in chronic kidney disease: Secondary | ICD-10-CM | POA: Diagnosis not present

## 2020-01-25 DIAGNOSIS — N2581 Secondary hyperparathyroidism of renal origin: Secondary | ICD-10-CM | POA: Diagnosis not present

## 2020-01-25 DIAGNOSIS — E162 Hypoglycemia, unspecified: Secondary | ICD-10-CM | POA: Diagnosis not present

## 2020-01-25 DIAGNOSIS — N186 End stage renal disease: Secondary | ICD-10-CM | POA: Diagnosis not present

## 2020-01-25 DIAGNOSIS — T8249XA Other complication of vascular dialysis catheter, initial encounter: Secondary | ICD-10-CM | POA: Diagnosis not present

## 2020-01-25 DIAGNOSIS — N179 Acute kidney failure, unspecified: Secondary | ICD-10-CM | POA: Diagnosis not present

## 2020-01-27 DIAGNOSIS — N186 End stage renal disease: Secondary | ICD-10-CM | POA: Diagnosis not present

## 2020-01-27 DIAGNOSIS — E162 Hypoglycemia, unspecified: Secondary | ICD-10-CM | POA: Diagnosis not present

## 2020-01-27 DIAGNOSIS — Z992 Dependence on renal dialysis: Secondary | ICD-10-CM | POA: Diagnosis not present

## 2020-01-27 DIAGNOSIS — Z23 Encounter for immunization: Secondary | ICD-10-CM | POA: Diagnosis not present

## 2020-01-27 DIAGNOSIS — N2581 Secondary hyperparathyroidism of renal origin: Secondary | ICD-10-CM | POA: Diagnosis not present

## 2020-01-27 DIAGNOSIS — T8249XA Other complication of vascular dialysis catheter, initial encounter: Secondary | ICD-10-CM | POA: Diagnosis not present

## 2020-01-28 ENCOUNTER — Other Ambulatory Visit: Payer: Self-pay | Admitting: Internal Medicine

## 2020-01-29 DIAGNOSIS — I509 Heart failure, unspecified: Secondary | ICD-10-CM | POA: Diagnosis not present

## 2020-01-29 DIAGNOSIS — Z9181 History of falling: Secondary | ICD-10-CM | POA: Diagnosis not present

## 2020-01-29 DIAGNOSIS — I214 Non-ST elevation (NSTEMI) myocardial infarction: Secondary | ICD-10-CM | POA: Diagnosis not present

## 2020-01-29 DIAGNOSIS — I2510 Atherosclerotic heart disease of native coronary artery without angina pectoris: Secondary | ICD-10-CM | POA: Diagnosis not present

## 2020-01-29 DIAGNOSIS — N186 End stage renal disease: Secondary | ICD-10-CM | POA: Diagnosis not present

## 2020-01-29 DIAGNOSIS — D62 Acute posthemorrhagic anemia: Secondary | ICD-10-CM | POA: Diagnosis not present

## 2020-01-29 DIAGNOSIS — Z7982 Long term (current) use of aspirin: Secondary | ICD-10-CM | POA: Diagnosis not present

## 2020-01-29 DIAGNOSIS — E1122 Type 2 diabetes mellitus with diabetic chronic kidney disease: Secondary | ICD-10-CM | POA: Diagnosis not present

## 2020-01-29 DIAGNOSIS — I132 Hypertensive heart and chronic kidney disease with heart failure and with stage 5 chronic kidney disease, or end stage renal disease: Secondary | ICD-10-CM | POA: Diagnosis not present

## 2020-01-29 DIAGNOSIS — Z951 Presence of aortocoronary bypass graft: Secondary | ICD-10-CM | POA: Diagnosis not present

## 2020-01-29 DIAGNOSIS — I4891 Unspecified atrial fibrillation: Secondary | ICD-10-CM | POA: Diagnosis not present

## 2020-01-29 DIAGNOSIS — D631 Anemia in chronic kidney disease: Secondary | ICD-10-CM | POA: Diagnosis not present

## 2020-01-29 DIAGNOSIS — E1142 Type 2 diabetes mellitus with diabetic polyneuropathy: Secondary | ICD-10-CM | POA: Diagnosis not present

## 2020-01-29 DIAGNOSIS — Z992 Dependence on renal dialysis: Secondary | ICD-10-CM | POA: Diagnosis not present

## 2020-01-29 DIAGNOSIS — Z48812 Encounter for surgical aftercare following surgery on the circulatory system: Secondary | ICD-10-CM | POA: Diagnosis not present

## 2020-01-30 DIAGNOSIS — Z23 Encounter for immunization: Secondary | ICD-10-CM | POA: Diagnosis not present

## 2020-01-30 DIAGNOSIS — N186 End stage renal disease: Secondary | ICD-10-CM | POA: Diagnosis not present

## 2020-01-30 DIAGNOSIS — E162 Hypoglycemia, unspecified: Secondary | ICD-10-CM | POA: Diagnosis not present

## 2020-01-30 DIAGNOSIS — N2581 Secondary hyperparathyroidism of renal origin: Secondary | ICD-10-CM | POA: Diagnosis not present

## 2020-01-30 DIAGNOSIS — Z992 Dependence on renal dialysis: Secondary | ICD-10-CM | POA: Diagnosis not present

## 2020-01-30 DIAGNOSIS — T8249XA Other complication of vascular dialysis catheter, initial encounter: Secondary | ICD-10-CM | POA: Diagnosis not present

## 2020-01-31 DIAGNOSIS — I2510 Atherosclerotic heart disease of native coronary artery without angina pectoris: Secondary | ICD-10-CM | POA: Diagnosis not present

## 2020-01-31 DIAGNOSIS — D62 Acute posthemorrhagic anemia: Secondary | ICD-10-CM | POA: Diagnosis not present

## 2020-01-31 DIAGNOSIS — E1142 Type 2 diabetes mellitus with diabetic polyneuropathy: Secondary | ICD-10-CM | POA: Diagnosis not present

## 2020-01-31 DIAGNOSIS — I214 Non-ST elevation (NSTEMI) myocardial infarction: Secondary | ICD-10-CM | POA: Diagnosis not present

## 2020-01-31 DIAGNOSIS — E1122 Type 2 diabetes mellitus with diabetic chronic kidney disease: Secondary | ICD-10-CM | POA: Diagnosis not present

## 2020-01-31 DIAGNOSIS — I4891 Unspecified atrial fibrillation: Secondary | ICD-10-CM | POA: Diagnosis not present

## 2020-01-31 DIAGNOSIS — Z9181 History of falling: Secondary | ICD-10-CM | POA: Diagnosis not present

## 2020-01-31 DIAGNOSIS — I509 Heart failure, unspecified: Secondary | ICD-10-CM | POA: Diagnosis not present

## 2020-01-31 DIAGNOSIS — Z951 Presence of aortocoronary bypass graft: Secondary | ICD-10-CM | POA: Diagnosis not present

## 2020-01-31 DIAGNOSIS — Z48812 Encounter for surgical aftercare following surgery on the circulatory system: Secondary | ICD-10-CM | POA: Diagnosis not present

## 2020-01-31 DIAGNOSIS — I132 Hypertensive heart and chronic kidney disease with heart failure and with stage 5 chronic kidney disease, or end stage renal disease: Secondary | ICD-10-CM | POA: Diagnosis not present

## 2020-01-31 DIAGNOSIS — N186 End stage renal disease: Secondary | ICD-10-CM | POA: Diagnosis not present

## 2020-01-31 DIAGNOSIS — Z992 Dependence on renal dialysis: Secondary | ICD-10-CM | POA: Diagnosis not present

## 2020-01-31 DIAGNOSIS — Z7982 Long term (current) use of aspirin: Secondary | ICD-10-CM | POA: Diagnosis not present

## 2020-01-31 DIAGNOSIS — D631 Anemia in chronic kidney disease: Secondary | ICD-10-CM | POA: Diagnosis not present

## 2020-01-31 MED ORDER — CLONAZEPAM 0.5 MG PO TBDP
0.5000 mg | ORAL_TABLET | Freq: Two times a day (BID) | ORAL | 0 refills | Status: DC
Start: 2020-01-31 — End: 2020-03-04

## 2020-01-31 NOTE — Addendum Note (Signed)
Addended by: Binnie Rail on: 01/31/2020 10:18 AM   Modules accepted: Orders

## 2020-02-01 DIAGNOSIS — N2581 Secondary hyperparathyroidism of renal origin: Secondary | ICD-10-CM | POA: Diagnosis not present

## 2020-02-01 DIAGNOSIS — T8249XA Other complication of vascular dialysis catheter, initial encounter: Secondary | ICD-10-CM | POA: Diagnosis not present

## 2020-02-01 DIAGNOSIS — Z992 Dependence on renal dialysis: Secondary | ICD-10-CM | POA: Diagnosis not present

## 2020-02-01 DIAGNOSIS — E162 Hypoglycemia, unspecified: Secondary | ICD-10-CM | POA: Diagnosis not present

## 2020-02-01 DIAGNOSIS — N186 End stage renal disease: Secondary | ICD-10-CM | POA: Diagnosis not present

## 2020-02-01 DIAGNOSIS — Z23 Encounter for immunization: Secondary | ICD-10-CM | POA: Diagnosis not present

## 2020-02-02 DIAGNOSIS — E1122 Type 2 diabetes mellitus with diabetic chronic kidney disease: Secondary | ICD-10-CM | POA: Diagnosis not present

## 2020-02-02 DIAGNOSIS — E1142 Type 2 diabetes mellitus with diabetic polyneuropathy: Secondary | ICD-10-CM | POA: Diagnosis not present

## 2020-02-02 DIAGNOSIS — Z48812 Encounter for surgical aftercare following surgery on the circulatory system: Secondary | ICD-10-CM | POA: Diagnosis not present

## 2020-02-02 DIAGNOSIS — N186 End stage renal disease: Secondary | ICD-10-CM | POA: Diagnosis not present

## 2020-02-02 DIAGNOSIS — D631 Anemia in chronic kidney disease: Secondary | ICD-10-CM | POA: Diagnosis not present

## 2020-02-02 DIAGNOSIS — Z992 Dependence on renal dialysis: Secondary | ICD-10-CM | POA: Diagnosis not present

## 2020-02-02 DIAGNOSIS — I2510 Atherosclerotic heart disease of native coronary artery without angina pectoris: Secondary | ICD-10-CM | POA: Diagnosis not present

## 2020-02-02 DIAGNOSIS — Z9181 History of falling: Secondary | ICD-10-CM | POA: Diagnosis not present

## 2020-02-02 DIAGNOSIS — I4891 Unspecified atrial fibrillation: Secondary | ICD-10-CM | POA: Diagnosis not present

## 2020-02-02 DIAGNOSIS — I132 Hypertensive heart and chronic kidney disease with heart failure and with stage 5 chronic kidney disease, or end stage renal disease: Secondary | ICD-10-CM | POA: Diagnosis not present

## 2020-02-02 DIAGNOSIS — I214 Non-ST elevation (NSTEMI) myocardial infarction: Secondary | ICD-10-CM | POA: Diagnosis not present

## 2020-02-02 DIAGNOSIS — D62 Acute posthemorrhagic anemia: Secondary | ICD-10-CM | POA: Diagnosis not present

## 2020-02-02 DIAGNOSIS — Z951 Presence of aortocoronary bypass graft: Secondary | ICD-10-CM | POA: Diagnosis not present

## 2020-02-02 DIAGNOSIS — Z7982 Long term (current) use of aspirin: Secondary | ICD-10-CM | POA: Diagnosis not present

## 2020-02-02 DIAGNOSIS — I509 Heart failure, unspecified: Secondary | ICD-10-CM | POA: Diagnosis not present

## 2020-02-05 ENCOUNTER — Telehealth: Payer: Self-pay | Admitting: Internal Medicine

## 2020-02-05 DIAGNOSIS — E1169 Type 2 diabetes mellitus with other specified complication: Secondary | ICD-10-CM

## 2020-02-05 DIAGNOSIS — Z7982 Long term (current) use of aspirin: Secondary | ICD-10-CM | POA: Diagnosis not present

## 2020-02-05 DIAGNOSIS — I10 Essential (primary) hypertension: Secondary | ICD-10-CM

## 2020-02-05 DIAGNOSIS — Z9181 History of falling: Secondary | ICD-10-CM | POA: Diagnosis not present

## 2020-02-05 DIAGNOSIS — D631 Anemia in chronic kidney disease: Secondary | ICD-10-CM | POA: Diagnosis not present

## 2020-02-05 DIAGNOSIS — D62 Acute posthemorrhagic anemia: Secondary | ICD-10-CM | POA: Diagnosis not present

## 2020-02-05 DIAGNOSIS — I132 Hypertensive heart and chronic kidney disease with heart failure and with stage 5 chronic kidney disease, or end stage renal disease: Secondary | ICD-10-CM | POA: Diagnosis not present

## 2020-02-05 DIAGNOSIS — E1142 Type 2 diabetes mellitus with diabetic polyneuropathy: Secondary | ICD-10-CM | POA: Diagnosis not present

## 2020-02-05 DIAGNOSIS — I214 Non-ST elevation (NSTEMI) myocardial infarction: Secondary | ICD-10-CM | POA: Diagnosis not present

## 2020-02-05 DIAGNOSIS — E119 Type 2 diabetes mellitus without complications: Secondary | ICD-10-CM

## 2020-02-05 DIAGNOSIS — I509 Heart failure, unspecified: Secondary | ICD-10-CM | POA: Diagnosis not present

## 2020-02-05 DIAGNOSIS — I4891 Unspecified atrial fibrillation: Secondary | ICD-10-CM | POA: Diagnosis not present

## 2020-02-05 DIAGNOSIS — Z951 Presence of aortocoronary bypass graft: Secondary | ICD-10-CM | POA: Diagnosis not present

## 2020-02-05 DIAGNOSIS — N186 End stage renal disease: Secondary | ICD-10-CM | POA: Diagnosis not present

## 2020-02-05 DIAGNOSIS — I2510 Atherosclerotic heart disease of native coronary artery without angina pectoris: Secondary | ICD-10-CM | POA: Diagnosis not present

## 2020-02-05 DIAGNOSIS — Z48812 Encounter for surgical aftercare following surgery on the circulatory system: Secondary | ICD-10-CM | POA: Diagnosis not present

## 2020-02-05 DIAGNOSIS — E1122 Type 2 diabetes mellitus with diabetic chronic kidney disease: Secondary | ICD-10-CM | POA: Diagnosis not present

## 2020-02-05 DIAGNOSIS — Z992 Dependence on renal dialysis: Secondary | ICD-10-CM | POA: Diagnosis not present

## 2020-02-05 NOTE — Progress Notes (Signed)
  Chronic Care Management   Note  02/05/2020 Name: Christian [name] MRN: [id number] DOB: 10/24/1945  Christian [name] is a 74 y.o. year old male who is a primary care patient of Hoyt Koch, MD. I reached out to [name] by phone today in response to a referral sent by Christian [name]'s PCP, Hoyt Koch, MD.   Christian [name] was given information about Chronic Care Management services today including:  1. CCM service includes personalized support from designated clinical staff supervised by his physician, including individualized plan of care and coordination with other care providers 2. 24/7 contact phone numbers for assistance for urgent and routine care needs. 3. Service will only be billed when office clinical staff spend 20 minutes or more in a month to coordinate care. 4. Only one practitioner may furnish and bill the service in a calendar month. 5. The patient may stop CCM services at any time (effective at the end of the month) by phone call to the office staff.   Patient agreed to services and verbal consent obtained.   Follow up plan:   Carley Perdue [employer] Scheduler

## 2020-02-06 DIAGNOSIS — I120 Hypertensive chronic kidney disease with stage 5 chronic kidney disease or end stage renal disease: Secondary | ICD-10-CM | POA: Diagnosis not present

## 2020-02-06 DIAGNOSIS — N189 Chronic kidney disease, unspecified: Secondary | ICD-10-CM | POA: Diagnosis not present

## 2020-02-06 DIAGNOSIS — N2581 Secondary hyperparathyroidism of renal origin: Secondary | ICD-10-CM | POA: Diagnosis not present

## 2020-02-06 DIAGNOSIS — D631 Anemia in chronic kidney disease: Secondary | ICD-10-CM | POA: Diagnosis not present

## 2020-02-06 DIAGNOSIS — E1122 Type 2 diabetes mellitus with diabetic chronic kidney disease: Secondary | ICD-10-CM | POA: Diagnosis not present

## 2020-02-06 DIAGNOSIS — N185 Chronic kidney disease, stage 5: Secondary | ICD-10-CM | POA: Diagnosis not present

## 2020-02-07 ENCOUNTER — Telehealth: Payer: Self-pay | Admitting: Internal Medicine

## 2020-02-07 DIAGNOSIS — I4891 Unspecified atrial fibrillation: Secondary | ICD-10-CM | POA: Diagnosis not present

## 2020-02-07 DIAGNOSIS — I509 Heart failure, unspecified: Secondary | ICD-10-CM | POA: Diagnosis not present

## 2020-02-07 DIAGNOSIS — D631 Anemia in chronic kidney disease: Secondary | ICD-10-CM | POA: Diagnosis not present

## 2020-02-07 DIAGNOSIS — D62 Acute posthemorrhagic anemia: Secondary | ICD-10-CM | POA: Diagnosis not present

## 2020-02-07 DIAGNOSIS — I132 Hypertensive heart and chronic kidney disease with heart failure and with stage 5 chronic kidney disease, or end stage renal disease: Secondary | ICD-10-CM | POA: Diagnosis not present

## 2020-02-07 DIAGNOSIS — N186 End stage renal disease: Secondary | ICD-10-CM | POA: Diagnosis not present

## 2020-02-07 DIAGNOSIS — Z951 Presence of aortocoronary bypass graft: Secondary | ICD-10-CM | POA: Diagnosis not present

## 2020-02-07 DIAGNOSIS — Z992 Dependence on renal dialysis: Secondary | ICD-10-CM | POA: Diagnosis not present

## 2020-02-07 DIAGNOSIS — I2510 Atherosclerotic heart disease of native coronary artery without angina pectoris: Secondary | ICD-10-CM | POA: Diagnosis not present

## 2020-02-07 DIAGNOSIS — E1122 Type 2 diabetes mellitus with diabetic chronic kidney disease: Secondary | ICD-10-CM | POA: Diagnosis not present

## 2020-02-07 DIAGNOSIS — Z48812 Encounter for surgical aftercare following surgery on the circulatory system: Secondary | ICD-10-CM | POA: Diagnosis not present

## 2020-02-07 DIAGNOSIS — E1142 Type 2 diabetes mellitus with diabetic polyneuropathy: Secondary | ICD-10-CM | POA: Diagnosis not present

## 2020-02-07 DIAGNOSIS — I214 Non-ST elevation (NSTEMI) myocardial infarction: Secondary | ICD-10-CM | POA: Diagnosis not present

## 2020-02-07 DIAGNOSIS — Z7982 Long term (current) use of aspirin: Secondary | ICD-10-CM | POA: Diagnosis not present

## 2020-02-07 DIAGNOSIS — Z9181 History of falling: Secondary | ICD-10-CM | POA: Diagnosis not present

## 2020-02-07 NOTE — Telephone Encounter (Signed)
Verbal orders given via VM 

## 2020-02-07 NOTE — Telephone Encounter (Signed)
   Mallory from [facility] calling to request order to extend home OT, 1 x week for 5 weeks  Call [phone], ok to leave a message

## 2020-02-09 ENCOUNTER — Ambulatory Visit (INDEPENDENT_AMBULATORY_CARE_PROVIDER_SITE_OTHER): Payer: Medicare Other | Admitting: Vascular Surgery

## 2020-02-09 ENCOUNTER — Encounter: Payer: Self-pay | Admitting: Vascular Surgery

## 2020-02-09 ENCOUNTER — Other Ambulatory Visit: Payer: Self-pay

## 2020-02-09 ENCOUNTER — Ambulatory Visit (INDEPENDENT_AMBULATORY_CARE_PROVIDER_SITE_OTHER)
Admission: RE | Admit: 2020-02-09 | Discharge: 2020-02-09 | Disposition: A | Discharge status: Home / Self Care | Payer: Medicare Other | Source: Ambulatory Visit | Attending: Vascular Surgery | Admitting: Vascular Surgery

## 2020-02-09 ENCOUNTER — Ambulatory Visit (HOSPITAL_COMMUNITY)
Admission: RE | Admit: 2020-02-09 | Discharge: 2020-02-09 | Disposition: A | Discharge status: Home / Self Care | Payer: Medicare Other | Source: Ambulatory Visit | Attending: Vascular Surgery | Admitting: Vascular Surgery

## 2020-02-09 VITALS — BP 145/87 | HR 102 | Temp 97.6°F | Resp 18 | Ht 69.0 in | Wt 168.0 lb

## 2020-02-09 DIAGNOSIS — N186 End stage renal disease: Secondary | ICD-10-CM | POA: Insufficient documentation

## 2020-02-09 NOTE — Progress Notes (Signed)
Patient ID: Christian [name], male   DOB: 1945-07-29, 74 y.o.   MRN: [id number]  Reason for Consult: New Patient (Initial Visit)   Referred by Claudia Desanctis, MD  Subjective:     HPI:  Christian [name] is a 74 y.o. male right-hand-dominant currently has a catheter for dialysis.  He states that he has not required dialysis and 1 week.  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  He does not have any pacemakers no ports.  He does walk with help of walker.  He does not take any blood thinners.  Past Medical History:  Diagnosis Date  . BPH (benign prostatic hypertrophy)   . Coronary artery disease   . Diabetes mellitus without complication ([place])   . Dry eyes left  . Hiatal hernia   . 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  LIMA TO LAD, RIMA TO PDA, RADIAL ARTERY TO CIRC AND SEQUENTIALLY TO OM1. 08/15/2019  . Hyperlipidemia   . Hypertension   . Incomplete bladder emptying   . Nocturia   . 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 SOB (shortness of breath) on exertion    Family History  Problem Relation Age of Onset  . Diabetes Mother   . Hypertension Mother   . Diabetes Father   . Diabetes Brother   . Hypertension Brother   . Diabetes Brother    Past Surgical History:  Procedure Laterality Date  . APPLICATION OF WOUND VAC N/A 08/24/2019   Procedure: APPLICATION OF WOUND VAC;  Surgeon: [name], MD;  Location: [place];  Service: Thoracic;  Laterality: N/A;  . APPLICATION OF WOUND VAC  08/29/2019   Procedure: Wound Vac change;  Surgeon: [name], MD;  Location: [place] [facility];  Service: Open Heart Surgery;;  . APPLICATION OF WOUND VAC N/A 09/04/2019   Procedure: Application Of Wound Vac;  Surgeon: Grace Almer, MD;  Location: [place];  Service: Open Heart Surgery;  Laterality: N/A;  .  APPLICATION OF WOUND VAC N/A 09/06/2019   Procedure: APPLICATION OF ACELL, APPLICATION OF WOUND VAC USING PREVENA INCISIONAL  DRESSING;  Surgeon: Wallace Going, DO;  Location: [place];  Service: Plastics;  Laterality: N/A;  . CARDIAC CATHETERIZATION    . CORONARY ARTERY BYPASS GRAFT N/A 08/15/2019   Procedure: CORONARY ARTERY BYPASS GRAFTING (CABG), x 4 using bilateral IMAs and left radial artery .  LIMA TO LAD, RIMA TO PDA, RADIAL ARTERY TO CIRC AND SEQUENTIALLY TO OM1.;  Surgeon: [name], MD;  Location: [place];  Service: Open Heart Surgery;  Laterality: N/A;  . CORONARY STENT PLACEMENT  02/27/2014   distal rt/pd coronary       dr Einar Gip  . CYSTO/ BLADDER BIOPSY'S/ CAUTHERIZATION  01-14-2004  DR Gaynelle Arabian  . EXPLORATION POST OPERATIVE OPEN HEART N/A 08/16/2019   Procedure: Chest Closure S?P CABG WITH APPLICATION OF PREVENA  INCISIONAL WOUND VAC;  Surgeon: [name], MD;  Location: [facility];  Service: Open Heart Surgery;  Laterality: N/A;  . EXPLORATION POST OPERATIVE OPEN HEART N/A 08/21/2019   Procedure: CHEST WASHOUT S/P OPEN CHEST;  Surgeon: [name], MD;  Location: [place];  Service: Open Heart Surgery;  Laterality: N/A;  Open chest with Esmark dressing with Ioban sealant coverage.  . EXPLORATION  POST OPERATIVE OPEN HEART N/A 08/18/2019   Procedure: EXPLORATION POST OPERATIVE OPEN HEART (performed 04/23 on unit);  Surgeon: [name], MD;  Location: [place];  Service: Open Heart Surgery;  Laterality: N/A;  . EXPLORATION POST OPERATIVE OPEN HEART N/A 08/24/2019   Procedure: CHEST WASHOUT POST OPERATIVE OPEN HEART;  Surgeon: [name], MD;  Location: [place];  Service: Open Heart Surgery;  Laterality: N/A;  . EXPLORATION POST OPERATIVE OPEN HEART N/A 08/29/2019   Procedure: CHEST WOUND WASHOUT POST OPERATIVE OPEN HEART;  Surgeon: [name], MD;  Location: [place];  Service: Open Heart Surgery;  Laterality: N/A;  . EXPLORATION POST OPERATIVE OPEN HEART N/A  09/04/2019   Procedure: MEDIASTINAL EXPLORATION WITH STERNAL WOUND IRRIGATION;  Surgeon: Grace Hermann, MD;  Location: [place];  Service: Open Heart Surgery;  Laterality: N/A;  . EXPLORATION POST OPERATIVE OPEN HEART N/A 09/14/2019   Procedure: EVACUATION OF HEMATOMA;  Surgeon: Grace Iran, MD;  Location: [place];  Service: Open Heart Surgery;  Laterality: N/A;  . IR FLUORO GUIDE CV LINE LEFT  09/19/2019  . IR GASTROSTOMY TUBE MOD SED  10/04/2019  . IR GASTROSTOMY TUBE REMOVAL  11/29/2019  . IR PATIENT EVAL TECH 0-60 MINS  09/29/2019  . IR US GUIDE VASC ACCESS LEFT  09/19/2019  . LAPAROSCOPIC LYSIS OF ADHESIONS N/A 09/06/2019   Procedure: LAPAROSCOPIC OMENTAL HARVEST;  Surgeon: Kinsinger, Arta Bruce, MD;  Location: [place];  Service: General;  Laterality: N/A;  . LEFT HEART CATH AND CORONARY ANGIOGRAPHY N/A 08/10/2019   Procedure: LEFT HEART CATH AND CORONARY ANGIOGRAPHY;  Surgeon: Nigel Mormon, MD;  Location: [place] CV [facility];  Service: Cardiovascular;  Laterality: N/A;  . LEFT HEART CATHETERIZATION WITH CORONARY ANGIOGRAM N/A 02/27/2014   Procedure: LEFT HEART CATHETERIZATION WITH CORONARY ANGIOGRAM;  Surgeon: Laverda Page, MD;  Location: [facility] CATH LAB;  Service: Cardiovascular;  Laterality: N/A;  . MEDIASTINAL EXPLORATION N/A 09/06/2019   Procedure: MEDIASTINAL EXPLORATION;  Surgeon: Grace Asmar, MD;  Location: [place];  Service: Thoracic;  Laterality: N/A;  . PECTORALIS FLAP  09/06/2019   Procedure: Pectoralis ADVANCEMENT Flap;  Surgeon: Wallace Going, DO;  Location: [place];  Service: Plastics;;  . PERCUTANEOUS CORONARY STENT INTERVENTION (PCI-S)  02/27/2014   Procedure: PERCUTANEOUS CORONARY STENT INTERVENTION (PCI-S);  Surgeon: Laverda Page, MD;  Location[facility] CATH LAB;  Service: Cardiovascular;;  rt PDA  3.0/28mm Promus stent  . RADIAL ARTERY HARVEST Left 08/15/2019   Procedure: Radial Artery Harvest;  Surgeon: [name], MD;  Location: [place];  Service: Open  Heart Surgery;  Laterality: Left;  . RIB PLATING N/A 09/06/2019   Procedure: STERNAL PLATING;  Surgeon: Grace Damico, MD;  Location: [place];  Service: Thoracic;  Laterality: N/A;  . TEE WITHOUT CARDIOVERSION N/A 08/15/2019   Procedure: TRANSESOPHAGEAL ECHOCARDIOGRAM (TEE);  Surgeon: [name], MD;  Location: [place];  Service: Open Heart Surgery;  Laterality: N/A;  . TRACHEOSTOMY TUBE PLACEMENT  08/29/2019   Procedure: Tracheostomy;  Surgeon: [name], MD;  Location: [place] [facility];  Service: Open Heart Surgery;;  . TRANSURETHRAL RESECTION OF PROSTATE  04/04/2012   Procedure: TRANSURETHRAL RESECTION OF THE PROSTATE WITH GYRUS INSTRUMENTS;  Surgeon: Ailene Rud, MD;  Location: [place];  Service: Urology;  Laterality: N/A;  . TRANSURETHRAL RESECTION OF PROSTATE N/A 09/27/2014   Procedure: TRANSURETHRAL RESECTION OF THE PROSTATE ;  Surgeon: Carolan Clines, MD;  Location: [facility];  Service:  Urology;  Laterality: N/A;  . UPPER GASTROINTESTINAL ENDOSCOPY      Short Social History:  Social History   Tobacco Use  . Smoking status: Former Smoker    Packs/day: 0.50    Years: 20.00    Pack years: 10.00    Types: Cigarettes    Quit date: 1975    Years since quitting: 46.8  . Smokeless tobacco: Never Used  Substance Use Topics  . Alcohol use: Not Currently    Alcohol/week: 0.0 standard drinks    Comment: 1 beer week rarely    No Known Allergies  Current Outpatient Medications  Medication Sig Dispense Refill  . acetaminophen (TYLENOL) 325 MG tablet Take 2 tablets (650 mg total) by mouth every 6 (six) hours as needed for mild pain or fever.    Marland Kitchen aspirin EC 81 MG tablet Take 81 mg by mouth daily.    Marland Kitchen atorvastatin (LIPITOR) 20 MG tablet TAKE 1 TABLET BY MOUTH EVERY DAY IN THE EVENING (Patient taking differently: Take 20 mg by mouth every evening. ) 90 tablet 3  . clonazePAM (KLONOPIN) 0.5 MG disintegrating tablet Take 1 tablet (0.5 mg total) by mouth 2 (two)  times daily. 60 tablet 0  . clopidogrel (PLAVIX) 75 MG tablet Take 1 tablet (75 mg total) by mouth daily. 90 tablet 3  . cromolyn (OPTICROM) 4 % ophthalmic solution SMARTSIG:1 Drop(s) In Eye(s) Every 6-12 Hours PRN    . metoprolol succinate (TOPROL-XL) 25 MG 24 hr tablet Take 1 tablet (25 mg total) by mouth daily. Take with or immediately following a meal in morning. Hold dialysis days 30 tablet 2  . multivitamin (RENA-VIT) TABS tablet Take 1 tablet by mouth at bedtime.  0  . nitroGLYCERIN (NITROSTAT) 0.4 MG SL tablet Place 0.4 mg under the tongue as directed.    Marland Kitchen Nystatin (GERHARDT'S BUTT CREAM) CREA Apply 1 application topically 4 (four) times daily as needed for irritation.    Marland Kitchen oxyCODONE (OXY IR/ROXICODONE) 5 MG immediate release tablet Take 1 tablet (5 mg total) by mouth every 6 (six) hours as needed for severe pain. 28 tablet 0  . PREVIDENT 5000 BOOSTER PLUS 1.1 % PSTE Take 1 application by mouth daily.     . SENNA-PLUS 8.6-50 MG tablet TAKE 2 TABLETS BY MOUTH AT BEDTIME FOR CONSTIPATION 60 tablet 1  . sertraline (ZOLOFT) 25 MG tablet TAKE 1 TABLET ONE TIME A DAY 90 tablet 1  . sevelamer carbonate (RENVELA) 0.8 g PACK packet Take 1.6 g by mouth 3 (three) times daily with meals. 270 each   . silodosin (RAPAFLO) 8 MG CAPS capsule Take 8 mg by mouth at bedtime.    . tamsulosin (FLOMAX) 0.4 MG CAPS capsule Take 0.4 mg by mouth daily after breakfast.     . Trospium Chloride 60 MG CP24 Take 1 capsule by mouth daily.     No current facility-administered medications for this visit.    Review of Systems  Constitutional:  Constitutional negative. HENT: HENT negative.  Eyes: Eyes negative.  Respiratory: Respiratory negative.  Cardiovascular: Cardiovascular negative.  GI: Gastrointestinal negative.  Musculoskeletal: Musculoskeletal negative.  Skin: Skin negative.  Neurological:       Left hand weak grip Hematologic: Hematologic/lymphatic negative.  Psychiatric: Psychiatric negative.         Objective:  Objective   Vitals:   02/09/20 1506  BP: (!) 145/87  Pulse: (!) 102  Resp: 18  Temp: 97.6 F (36.4 C)  TempSrc: Temporal  SpO2: 96%  Weight: 168  lb (76.2 kg)  Height: 5\' 9"  (1.753 m)   Body mass index is 24.81 kg/m.  Physical Exam HENT:     Head: Normocephalic.     Nose:     Comments: Wearing a mask Eyes:     Pupils: Pupils are equal, round, and reactive to light.  Cardiovascular:     Pulses:          Radial pulses are 2+ on the right side and 0 on the left side.  Pulmonary:     Effort: Pulmonary effort is normal.  Abdominal:     General: Abdomen is flat.  Skin:    General: Skin is warm and dry.     Capillary Refill: Capillary refill takes less than 2 seconds.  Neurological:     General: No focal deficit present.     Mental Status: He is alert.  Psychiatric:        Mood and Affect: Mood normal.        Behavior: Behavior normal.        Thought Content: Thought content normal.        Judgment: Judgment normal.     Data: Right Pre-Dialysis Findings:  +-----------------------+----------+--------------------+---------+--------  +  Location        PSV (cm/s)Intralum. Diam. (cm)Waveform  Comments  +-----------------------+----------+--------------------+---------+--------  +  Brachial Antecub. [id number]    0.58        triphasic        +-----------------------+----------+--------------------+---------+--------  +  Radial Art at Wrist  78    0.32        triphasic        +-----------------------+----------+--------------------+---------+--------  +  Ulnar Art at Wrist   73    0.29        triphasic        +-----------------------+----------+--------------------+---------+--------  +      Left Pre-Dialysis Findings:  +-----------------------+----------+--------------------+---------+--------  +  Location        PSV (cm/s)Intralum. Diam. (cm)Waveform    Comments  +-----------------------+----------+--------------------+---------+--------  +  Brachial Antecub. [id number]    0.45        triphasic        +-----------------------+----------+--------------------+---------+--------  +  Radial Art at Wrist  90    0.35        triphasic        +-----------------------+----------+--------------------+---------+--------  +  Ulnar Art at Wrist   42    0.24        triphasic        +-----------------------+----------+--------------------+---------+--------  +  +-----------------+-------------+----------+--------------+  Left Cephalic  Diameter (cm)Depth (cm)  Findings    +-----------------+-------------+----------+--------------+  Shoulder                 not visualized  +-----------------+-------------+----------+--------------+  Prox upper arm              not visualized  +-----------------+-------------+----------+--------------+  Mid upper arm              not visualized  +-----------------+-------------+----------+--------------+  Dist upper arm              not visualized  +-----------------+-------------+----------+--------------+  Antecubital fossa  0.40                 +-----------------+-------------+----------+--------------+  Prox forearm     0.33                 +-----------------+-------------+----------+--------------+  Mid forearm     0.41                 +-----------------+-------------+----------+--------------+  Dist forearm     0.34                 +-----------------+-------------+----------+--------------+   +-----------------+-------------+----------+--------+  Left Basilic   Diameter (cm)Depth (cm)Findings  +-----------------+-------------+----------+--------+  Mid upper  arm    0.59              +-----------------+-------------+----------+--------+  Dist upper arm    0.53              +-----------------+-------------+----------+--------+  Antecubital fossa  0.59              +-----------------+-------------+----------+--------+  Prox forearm     0.32              +-----------------+-------------+----------+--------+          Assessment/Plan:     74 year old male currently on dialysis via catheter though he has not required in the past 1-2 weeks.  We have discussed his options for dialysis access he would like to keep it in the left arm where he has had dysfunction from a radial artery harvest for bypass.  We discussed that he does have suitable basilic vein which would require 2 procedures.  Patient would like to discuss with his nephrologist whether or not he is going to need long-term dialysis given that he has been off for at least 1 week.  He can continue dialyzing via catheter.  He will call to schedule left arm access if necessary in the near future.     Waynetta Sandy MD [facility]

## 2020-02-12 DIAGNOSIS — N2581 Secondary hyperparathyroidism of renal origin: Secondary | ICD-10-CM | POA: Diagnosis not present

## 2020-02-12 DIAGNOSIS — R262 Difficulty in walking, not elsewhere classified: Secondary | ICD-10-CM | POA: Diagnosis not present

## 2020-02-12 DIAGNOSIS — E1122 Type 2 diabetes mellitus with diabetic chronic kidney disease: Secondary | ICD-10-CM | POA: Diagnosis not present

## 2020-02-12 DIAGNOSIS — D631 Anemia in chronic kidney disease: Secondary | ICD-10-CM | POA: Diagnosis not present

## 2020-02-12 DIAGNOSIS — N189 Chronic kidney disease, unspecified: Secondary | ICD-10-CM | POA: Diagnosis not present

## 2020-02-12 DIAGNOSIS — Z992 Dependence on renal dialysis: Secondary | ICD-10-CM | POA: Diagnosis not present

## 2020-02-12 DIAGNOSIS — N185 Chronic kidney disease, stage 5: Secondary | ICD-10-CM | POA: Diagnosis not present

## 2020-02-12 DIAGNOSIS — N186 End stage renal disease: Secondary | ICD-10-CM | POA: Diagnosis not present

## 2020-02-12 DIAGNOSIS — I120 Hypertensive chronic kidney disease with stage 5 chronic kidney disease or end stage renal disease: Secondary | ICD-10-CM | POA: Diagnosis not present

## 2020-02-12 DIAGNOSIS — I214 Non-ST elevation (NSTEMI) myocardial infarction: Secondary | ICD-10-CM | POA: Diagnosis not present

## 2020-02-14 DIAGNOSIS — E1142 Type 2 diabetes mellitus with diabetic polyneuropathy: Secondary | ICD-10-CM | POA: Diagnosis not present

## 2020-02-14 DIAGNOSIS — I509 Heart failure, unspecified: Secondary | ICD-10-CM | POA: Diagnosis not present

## 2020-02-14 DIAGNOSIS — Z9181 History of falling: Secondary | ICD-10-CM | POA: Diagnosis not present

## 2020-02-14 DIAGNOSIS — I132 Hypertensive heart and chronic kidney disease with heart failure and with stage 5 chronic kidney disease, or end stage renal disease: Secondary | ICD-10-CM | POA: Diagnosis not present

## 2020-02-14 DIAGNOSIS — D62 Acute posthemorrhagic anemia: Secondary | ICD-10-CM | POA: Diagnosis not present

## 2020-02-14 DIAGNOSIS — Z992 Dependence on renal dialysis: Secondary | ICD-10-CM | POA: Diagnosis not present

## 2020-02-14 DIAGNOSIS — Z7982 Long term (current) use of aspirin: Secondary | ICD-10-CM | POA: Diagnosis not present

## 2020-02-14 DIAGNOSIS — Z48812 Encounter for surgical aftercare following surgery on the circulatory system: Secondary | ICD-10-CM | POA: Diagnosis not present

## 2020-02-14 DIAGNOSIS — Z951 Presence of aortocoronary bypass graft: Secondary | ICD-10-CM | POA: Diagnosis not present

## 2020-02-14 DIAGNOSIS — I2510 Atherosclerotic heart disease of native coronary artery without angina pectoris: Secondary | ICD-10-CM | POA: Diagnosis not present

## 2020-02-14 DIAGNOSIS — I214 Non-ST elevation (NSTEMI) myocardial infarction: Secondary | ICD-10-CM | POA: Diagnosis not present

## 2020-02-14 DIAGNOSIS — N186 End stage renal disease: Secondary | ICD-10-CM | POA: Diagnosis not present

## 2020-02-14 DIAGNOSIS — E1122 Type 2 diabetes mellitus with diabetic chronic kidney disease: Secondary | ICD-10-CM | POA: Diagnosis not present

## 2020-02-14 DIAGNOSIS — I4891 Unspecified atrial fibrillation: Secondary | ICD-10-CM | POA: Diagnosis not present

## 2020-02-14 DIAGNOSIS — D631 Anemia in chronic kidney disease: Secondary | ICD-10-CM | POA: Diagnosis not present

## 2020-02-16 ENCOUNTER — Telehealth: Payer: Self-pay | Admitting: Internal Medicine

## 2020-02-16 DIAGNOSIS — I509 Heart failure, unspecified: Secondary | ICD-10-CM | POA: Diagnosis not present

## 2020-02-16 DIAGNOSIS — Z992 Dependence on renal dialysis: Secondary | ICD-10-CM | POA: Diagnosis not present

## 2020-02-16 DIAGNOSIS — D631 Anemia in chronic kidney disease: Secondary | ICD-10-CM | POA: Diagnosis not present

## 2020-02-16 DIAGNOSIS — I4891 Unspecified atrial fibrillation: Secondary | ICD-10-CM | POA: Diagnosis not present

## 2020-02-16 DIAGNOSIS — Z7982 Long term (current) use of aspirin: Secondary | ICD-10-CM | POA: Diagnosis not present

## 2020-02-16 DIAGNOSIS — Z9181 History of falling: Secondary | ICD-10-CM | POA: Diagnosis not present

## 2020-02-16 DIAGNOSIS — Z48812 Encounter for surgical aftercare following surgery on the circulatory system: Secondary | ICD-10-CM | POA: Diagnosis not present

## 2020-02-16 DIAGNOSIS — I132 Hypertensive heart and chronic kidney disease with heart failure and with stage 5 chronic kidney disease, or end stage renal disease: Secondary | ICD-10-CM | POA: Diagnosis not present

## 2020-02-16 DIAGNOSIS — D62 Acute posthemorrhagic anemia: Secondary | ICD-10-CM | POA: Diagnosis not present

## 2020-02-16 DIAGNOSIS — N186 End stage renal disease: Secondary | ICD-10-CM | POA: Diagnosis not present

## 2020-02-16 DIAGNOSIS — E1142 Type 2 diabetes mellitus with diabetic polyneuropathy: Secondary | ICD-10-CM | POA: Diagnosis not present

## 2020-02-16 DIAGNOSIS — Z951 Presence of aortocoronary bypass graft: Secondary | ICD-10-CM | POA: Diagnosis not present

## 2020-02-16 DIAGNOSIS — I2510 Atherosclerotic heart disease of native coronary artery without angina pectoris: Secondary | ICD-10-CM | POA: Diagnosis not present

## 2020-02-16 DIAGNOSIS — I214 Non-ST elevation (NSTEMI) myocardial infarction: Secondary | ICD-10-CM | POA: Diagnosis not present

## 2020-02-16 DIAGNOSIS — E1122 Type 2 diabetes mellitus with diabetic chronic kidney disease: Secondary | ICD-10-CM | POA: Diagnosis not present

## 2020-02-16 DIAGNOSIS — Z931 Gastrostomy status: Secondary | ICD-10-CM | POA: Diagnosis not present

## 2020-02-16 NOTE — Telephone Encounter (Signed)
Form has been completed and mailed to pt as requested.

## 2020-02-16 NOTE — Telephone Encounter (Signed)
Patient requesting his handicap placard to be renewed. Patient # [phone]

## 2020-02-19 DIAGNOSIS — D62 Acute posthemorrhagic anemia: Secondary | ICD-10-CM | POA: Diagnosis not present

## 2020-02-19 DIAGNOSIS — E1142 Type 2 diabetes mellitus with diabetic polyneuropathy: Secondary | ICD-10-CM | POA: Diagnosis not present

## 2020-02-19 DIAGNOSIS — Z992 Dependence on renal dialysis: Secondary | ICD-10-CM | POA: Diagnosis not present

## 2020-02-19 DIAGNOSIS — I132 Hypertensive heart and chronic kidney disease with heart failure and with stage 5 chronic kidney disease, or end stage renal disease: Secondary | ICD-10-CM | POA: Diagnosis not present

## 2020-02-19 DIAGNOSIS — D631 Anemia in chronic kidney disease: Secondary | ICD-10-CM | POA: Diagnosis not present

## 2020-02-19 DIAGNOSIS — Z7982 Long term (current) use of aspirin: Secondary | ICD-10-CM | POA: Diagnosis not present

## 2020-02-19 DIAGNOSIS — I120 Hypertensive chronic kidney disease with stage 5 chronic kidney disease or end stage renal disease: Secondary | ICD-10-CM | POA: Diagnosis not present

## 2020-02-19 DIAGNOSIS — N186 End stage renal disease: Secondary | ICD-10-CM | POA: Diagnosis not present

## 2020-02-19 DIAGNOSIS — I2510 Atherosclerotic heart disease of native coronary artery without angina pectoris: Secondary | ICD-10-CM | POA: Diagnosis not present

## 2020-02-19 DIAGNOSIS — I4891 Unspecified atrial fibrillation: Secondary | ICD-10-CM | POA: Diagnosis not present

## 2020-02-19 DIAGNOSIS — N185 Chronic kidney disease, stage 5: Secondary | ICD-10-CM | POA: Diagnosis not present

## 2020-02-19 DIAGNOSIS — Z48812 Encounter for surgical aftercare following surgery on the circulatory system: Secondary | ICD-10-CM | POA: Diagnosis not present

## 2020-02-19 DIAGNOSIS — N2581 Secondary hyperparathyroidism of renal origin: Secondary | ICD-10-CM | POA: Diagnosis not present

## 2020-02-19 DIAGNOSIS — Z9181 History of falling: Secondary | ICD-10-CM | POA: Diagnosis not present

## 2020-02-19 DIAGNOSIS — I509 Heart failure, unspecified: Secondary | ICD-10-CM | POA: Diagnosis not present

## 2020-02-19 DIAGNOSIS — Z951 Presence of aortocoronary bypass graft: Secondary | ICD-10-CM | POA: Diagnosis not present

## 2020-02-19 DIAGNOSIS — N189 Chronic kidney disease, unspecified: Secondary | ICD-10-CM | POA: Diagnosis not present

## 2020-02-19 DIAGNOSIS — I214 Non-ST elevation (NSTEMI) myocardial infarction: Secondary | ICD-10-CM | POA: Diagnosis not present

## 2020-02-19 DIAGNOSIS — E1122 Type 2 diabetes mellitus with diabetic chronic kidney disease: Secondary | ICD-10-CM | POA: Diagnosis not present

## 2020-02-21 DIAGNOSIS — I132 Hypertensive heart and chronic kidney disease with heart failure and with stage 5 chronic kidney disease, or end stage renal disease: Secondary | ICD-10-CM | POA: Diagnosis not present

## 2020-02-21 DIAGNOSIS — N186 End stage renal disease: Secondary | ICD-10-CM | POA: Diagnosis not present

## 2020-02-21 DIAGNOSIS — Z9181 History of falling: Secondary | ICD-10-CM | POA: Diagnosis not present

## 2020-02-21 DIAGNOSIS — D631 Anemia in chronic kidney disease: Secondary | ICD-10-CM | POA: Diagnosis not present

## 2020-02-21 DIAGNOSIS — I2510 Atherosclerotic heart disease of native coronary artery without angina pectoris: Secondary | ICD-10-CM | POA: Diagnosis not present

## 2020-02-21 DIAGNOSIS — Z992 Dependence on renal dialysis: Secondary | ICD-10-CM | POA: Diagnosis not present

## 2020-02-21 DIAGNOSIS — Z7982 Long term (current) use of aspirin: Secondary | ICD-10-CM | POA: Diagnosis not present

## 2020-02-21 DIAGNOSIS — Z951 Presence of aortocoronary bypass graft: Secondary | ICD-10-CM | POA: Diagnosis not present

## 2020-02-21 DIAGNOSIS — E1142 Type 2 diabetes mellitus with diabetic polyneuropathy: Secondary | ICD-10-CM | POA: Diagnosis not present

## 2020-02-21 DIAGNOSIS — E1122 Type 2 diabetes mellitus with diabetic chronic kidney disease: Secondary | ICD-10-CM | POA: Diagnosis not present

## 2020-02-21 DIAGNOSIS — D62 Acute posthemorrhagic anemia: Secondary | ICD-10-CM | POA: Diagnosis not present

## 2020-02-21 DIAGNOSIS — I214 Non-ST elevation (NSTEMI) myocardial infarction: Secondary | ICD-10-CM | POA: Diagnosis not present

## 2020-02-21 DIAGNOSIS — Z48812 Encounter for surgical aftercare following surgery on the circulatory system: Secondary | ICD-10-CM | POA: Diagnosis not present

## 2020-02-21 DIAGNOSIS — I4891 Unspecified atrial fibrillation: Secondary | ICD-10-CM | POA: Diagnosis not present

## 2020-02-21 DIAGNOSIS — I509 Heart failure, unspecified: Secondary | ICD-10-CM | POA: Diagnosis not present

## 2020-02-22 ENCOUNTER — Other Ambulatory Visit: Payer: Self-pay | Admitting: Internal Medicine

## 2020-02-22 ENCOUNTER — Telehealth: Payer: Self-pay | Admitting: Internal Medicine

## 2020-02-22 NOTE — Telephone Encounter (Signed)
    Order [id number]) from [facility] received, placed in provider box for signature

## 2020-02-26 DIAGNOSIS — I4891 Unspecified atrial fibrillation: Secondary | ICD-10-CM | POA: Diagnosis not present

## 2020-02-26 DIAGNOSIS — D62 Acute posthemorrhagic anemia: Secondary | ICD-10-CM | POA: Diagnosis not present

## 2020-02-26 DIAGNOSIS — I509 Heart failure, unspecified: Secondary | ICD-10-CM | POA: Diagnosis not present

## 2020-02-26 DIAGNOSIS — E1122 Type 2 diabetes mellitus with diabetic chronic kidney disease: Secondary | ICD-10-CM | POA: Diagnosis not present

## 2020-02-26 DIAGNOSIS — Z48812 Encounter for surgical aftercare following surgery on the circulatory system: Secondary | ICD-10-CM | POA: Diagnosis not present

## 2020-02-26 DIAGNOSIS — E1142 Type 2 diabetes mellitus with diabetic polyneuropathy: Secondary | ICD-10-CM | POA: Diagnosis not present

## 2020-02-26 DIAGNOSIS — Z7982 Long term (current) use of aspirin: Secondary | ICD-10-CM | POA: Diagnosis not present

## 2020-02-26 DIAGNOSIS — I2510 Atherosclerotic heart disease of native coronary artery without angina pectoris: Secondary | ICD-10-CM | POA: Diagnosis not present

## 2020-02-26 DIAGNOSIS — D631 Anemia in chronic kidney disease: Secondary | ICD-10-CM | POA: Diagnosis not present

## 2020-02-26 DIAGNOSIS — Z951 Presence of aortocoronary bypass graft: Secondary | ICD-10-CM | POA: Diagnosis not present

## 2020-02-26 DIAGNOSIS — I132 Hypertensive heart and chronic kidney disease with heart failure and with stage 5 chronic kidney disease, or end stage renal disease: Secondary | ICD-10-CM | POA: Diagnosis not present

## 2020-02-26 DIAGNOSIS — I214 Non-ST elevation (NSTEMI) myocardial infarction: Secondary | ICD-10-CM | POA: Diagnosis not present

## 2020-02-26 DIAGNOSIS — N186 End stage renal disease: Secondary | ICD-10-CM | POA: Diagnosis not present

## 2020-02-26 DIAGNOSIS — Z9181 History of falling: Secondary | ICD-10-CM | POA: Diagnosis not present

## 2020-02-26 DIAGNOSIS — Z992 Dependence on renal dialysis: Secondary | ICD-10-CM | POA: Diagnosis not present

## 2020-02-29 DIAGNOSIS — Z452 Encounter for adjustment and management of vascular access device: Secondary | ICD-10-CM | POA: Diagnosis not present

## 2020-03-01 DIAGNOSIS — Z9181 History of falling: Secondary | ICD-10-CM | POA: Diagnosis not present

## 2020-03-01 DIAGNOSIS — N186 End stage renal disease: Secondary | ICD-10-CM | POA: Diagnosis not present

## 2020-03-01 DIAGNOSIS — D631 Anemia in chronic kidney disease: Secondary | ICD-10-CM | POA: Diagnosis not present

## 2020-03-01 DIAGNOSIS — I4891 Unspecified atrial fibrillation: Secondary | ICD-10-CM | POA: Diagnosis not present

## 2020-03-01 DIAGNOSIS — I214 Non-ST elevation (NSTEMI) myocardial infarction: Secondary | ICD-10-CM | POA: Diagnosis not present

## 2020-03-01 DIAGNOSIS — I509 Heart failure, unspecified: Secondary | ICD-10-CM | POA: Diagnosis not present

## 2020-03-01 DIAGNOSIS — D62 Acute posthemorrhagic anemia: Secondary | ICD-10-CM | POA: Diagnosis not present

## 2020-03-01 DIAGNOSIS — Z48812 Encounter for surgical aftercare following surgery on the circulatory system: Secondary | ICD-10-CM | POA: Diagnosis not present

## 2020-03-01 DIAGNOSIS — I2510 Atherosclerotic heart disease of native coronary artery without angina pectoris: Secondary | ICD-10-CM | POA: Diagnosis not present

## 2020-03-01 DIAGNOSIS — E1122 Type 2 diabetes mellitus with diabetic chronic kidney disease: Secondary | ICD-10-CM | POA: Diagnosis not present

## 2020-03-01 DIAGNOSIS — Z951 Presence of aortocoronary bypass graft: Secondary | ICD-10-CM | POA: Diagnosis not present

## 2020-03-01 DIAGNOSIS — Z992 Dependence on renal dialysis: Secondary | ICD-10-CM | POA: Diagnosis not present

## 2020-03-01 DIAGNOSIS — E1142 Type 2 diabetes mellitus with diabetic polyneuropathy: Secondary | ICD-10-CM | POA: Diagnosis not present

## 2020-03-01 DIAGNOSIS — Z7982 Long term (current) use of aspirin: Secondary | ICD-10-CM | POA: Diagnosis not present

## 2020-03-01 DIAGNOSIS — I132 Hypertensive heart and chronic kidney disease with heart failure and with stage 5 chronic kidney disease, or end stage renal disease: Secondary | ICD-10-CM | POA: Diagnosis not present

## 2020-03-03 ENCOUNTER — Other Ambulatory Visit: Payer: Self-pay | Admitting: Internal Medicine

## 2020-03-04 DIAGNOSIS — D631 Anemia in chronic kidney disease: Secondary | ICD-10-CM | POA: Diagnosis not present

## 2020-03-04 DIAGNOSIS — Z992 Dependence on renal dialysis: Secondary | ICD-10-CM | POA: Diagnosis not present

## 2020-03-04 DIAGNOSIS — Z9181 History of falling: Secondary | ICD-10-CM | POA: Diagnosis not present

## 2020-03-04 DIAGNOSIS — E1142 Type 2 diabetes mellitus with diabetic polyneuropathy: Secondary | ICD-10-CM | POA: Diagnosis not present

## 2020-03-04 DIAGNOSIS — E1122 Type 2 diabetes mellitus with diabetic chronic kidney disease: Secondary | ICD-10-CM | POA: Diagnosis not present

## 2020-03-04 DIAGNOSIS — I129 Hypertensive chronic kidney disease with stage 1 through stage 4 chronic kidney disease, or unspecified chronic kidney disease: Secondary | ICD-10-CM | POA: Diagnosis not present

## 2020-03-04 DIAGNOSIS — Z951 Presence of aortocoronary bypass graft: Secondary | ICD-10-CM | POA: Diagnosis not present

## 2020-03-04 DIAGNOSIS — I2510 Atherosclerotic heart disease of native coronary artery without angina pectoris: Secondary | ICD-10-CM | POA: Diagnosis not present

## 2020-03-04 DIAGNOSIS — N2581 Secondary hyperparathyroidism of renal origin: Secondary | ICD-10-CM | POA: Diagnosis not present

## 2020-03-04 DIAGNOSIS — I509 Heart failure, unspecified: Secondary | ICD-10-CM | POA: Diagnosis not present

## 2020-03-04 DIAGNOSIS — N189 Chronic kidney disease, unspecified: Secondary | ICD-10-CM | POA: Diagnosis not present

## 2020-03-04 DIAGNOSIS — N186 End stage renal disease: Secondary | ICD-10-CM | POA: Diagnosis not present

## 2020-03-04 DIAGNOSIS — I132 Hypertensive heart and chronic kidney disease with heart failure and with stage 5 chronic kidney disease, or end stage renal disease: Secondary | ICD-10-CM | POA: Diagnosis not present

## 2020-03-04 DIAGNOSIS — I4891 Unspecified atrial fibrillation: Secondary | ICD-10-CM | POA: Diagnosis not present

## 2020-03-04 DIAGNOSIS — I214 Non-ST elevation (NSTEMI) myocardial infarction: Secondary | ICD-10-CM | POA: Diagnosis not present

## 2020-03-04 DIAGNOSIS — Z48812 Encounter for surgical aftercare following surgery on the circulatory system: Secondary | ICD-10-CM | POA: Diagnosis not present

## 2020-03-04 DIAGNOSIS — N184 Chronic kidney disease, stage 4 (severe): Secondary | ICD-10-CM | POA: Diagnosis not present

## 2020-03-04 DIAGNOSIS — D62 Acute posthemorrhagic anemia: Secondary | ICD-10-CM | POA: Diagnosis not present

## 2020-03-04 DIAGNOSIS — Z7982 Long term (current) use of aspirin: Secondary | ICD-10-CM | POA: Diagnosis not present

## 2020-03-05 ENCOUNTER — Ambulatory Visit: Payer: Federal, State, Local not specified - PPO | Admitting: Cardiology

## 2020-03-05 ENCOUNTER — Other Ambulatory Visit: Payer: Self-pay

## 2020-03-05 ENCOUNTER — Encounter: Payer: Self-pay | Admitting: Cardiology

## 2020-03-05 VITALS — BP 158/96 | HR 106 | Resp 17 | Ht 69.0 in | Wt 181.0 lb

## 2020-03-05 DIAGNOSIS — I214 Non-ST elevation (NSTEMI) myocardial infarction: Secondary | ICD-10-CM | POA: Diagnosis not present

## 2020-03-05 DIAGNOSIS — Z951 Presence of aortocoronary bypass graft: Secondary | ICD-10-CM | POA: Diagnosis not present

## 2020-03-05 DIAGNOSIS — N186 End stage renal disease: Secondary | ICD-10-CM | POA: Diagnosis not present

## 2020-03-05 DIAGNOSIS — Z48812 Encounter for surgical aftercare following surgery on the circulatory system: Secondary | ICD-10-CM | POA: Diagnosis not present

## 2020-03-05 DIAGNOSIS — I509 Heart failure, unspecified: Secondary | ICD-10-CM | POA: Diagnosis not present

## 2020-03-05 DIAGNOSIS — I2510 Atherosclerotic heart disease of native coronary artery without angina pectoris: Secondary | ICD-10-CM

## 2020-03-05 DIAGNOSIS — I119 Hypertensive heart disease without heart failure: Secondary | ICD-10-CM | POA: Diagnosis not present

## 2020-03-05 DIAGNOSIS — Z9181 History of falling: Secondary | ICD-10-CM | POA: Diagnosis not present

## 2020-03-05 DIAGNOSIS — D631 Anemia in chronic kidney disease: Secondary | ICD-10-CM | POA: Diagnosis not present

## 2020-03-05 DIAGNOSIS — I132 Hypertensive heart and chronic kidney disease with heart failure and with stage 5 chronic kidney disease, or end stage renal disease: Secondary | ICD-10-CM | POA: Diagnosis not present

## 2020-03-05 DIAGNOSIS — E1142 Type 2 diabetes mellitus with diabetic polyneuropathy: Secondary | ICD-10-CM | POA: Diagnosis not present

## 2020-03-05 DIAGNOSIS — Z992 Dependence on renal dialysis: Secondary | ICD-10-CM | POA: Diagnosis not present

## 2020-03-05 DIAGNOSIS — D62 Acute posthemorrhagic anemia: Secondary | ICD-10-CM | POA: Diagnosis not present

## 2020-03-05 DIAGNOSIS — E1122 Type 2 diabetes mellitus with diabetic chronic kidney disease: Secondary | ICD-10-CM | POA: Diagnosis not present

## 2020-03-05 DIAGNOSIS — Z7982 Long term (current) use of aspirin: Secondary | ICD-10-CM | POA: Diagnosis not present

## 2020-03-05 DIAGNOSIS — I4891 Unspecified atrial fibrillation: Secondary | ICD-10-CM | POA: Diagnosis not present

## 2020-03-05 MED ORDER — METOPROLOL SUCCINATE ER 50 MG PO TB24: 50.0000 mg | ORAL_TABLET | Freq: Every day | ORAL | 2 refills | Status: DC

## 2020-03-05 MED ORDER — HYDRALAZINE HCL 25 MG PO TABS: 25.0000 mg | ORAL_TABLET | Freq: Three times a day (TID) | ORAL | 2 refills | Status: DC

## 2020-03-05 MED ORDER — ISOSORBIDE DINITRATE 30 MG PO TABS: 30.0000 mg | ORAL_TABLET | Freq: Three times a day (TID) | ORAL | 2 refills | Status: DC

## 2020-03-05 NOTE — Progress Notes (Signed)
Primary Physician/Referring:  Hoyt Koch, MD  Patient ID: Christian [name], male    DOB: May 09, 1945, 74 y.o.   MRN: [id number]  Chief Complaint  Patient presents with  . Coronary Artery Disease  . Follow-up    6 week   HPI:    Christian [name]  is a 74 y.o. AA male with hypertension, hyperlipidemia, DM with stage 3 CKD,  CAD with  stenting to the PDA on 02/27/2014. Patient presented with unstable angina and NSTEMI on 08/09/2019 along with a very small subsegmental pulmonary embolism and discharged on 10/18/2019 after he underwent CABG x4 on 08/15/2019.  Coronary angiography revealing complex ostial LAD stenosis with TIMI II flow and a calcific 75% ostial circumflex stenosis with reduced LVEF.   He had extremely complex postop complications including cardiogenic shock, cardiac arrest, needing internal CPR in the ICU after opening the chest, A. Fib needing cardioversion, closure office chest wall, developed acute renal failure leading to need for permanent dialysis, right brain infarct with left hemiparesis, multiple blood transfusions.   He is now off of Dialysis since 02/01/2020. He has been gaining weight as he feels well and has been eating excessively.  He has not had any recurrence of angina pectoris.  States that his strength is gradually improving and he has home health now.  Past Medical History:  Diagnosis Date  . BPH (benign prostatic hypertrophy)   . Coronary artery disease   . Diabetes mellitus without complication ([place])   . Dry eyes left  . Hiatal hernia   . 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  LIMA TO LAD, RIMA TO PDA, RADIAL ARTERY TO CIRC AND SEQUENTIALLY TO OM1. 08/15/2019  . Hyperlipidemia   . Hypertension   . Incomplete bladder emptying   . Nocturia   . 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 SOB (shortness of breath) on  exertion    Past Surgical History:  Procedure Laterality Date  . APPLICATION OF WOUND VAC N/A 08/24/2019   Procedure: APPLICATION OF WOUND VAC;  Surgeon: [name], MD;  Location: [place];  Service: Thoracic;  Laterality: N/A;  . APPLICATION OF WOUND VAC  08/29/2019   Procedure: Wound Vac change;  Surgeon: [name], MD;  Location: [place] [facility];  Service: Open Heart Surgery;;  . APPLICATION OF WOUND VAC N/A 09/04/2019   Procedure: Application Of Wound Vac;  Surgeon: Grace Tell, MD;  Location: [place];  Service: Open Heart Surgery;  Laterality: N/A;  . APPLICATION OF WOUND VAC N/A 09/06/2019   Procedure: APPLICATION OF ACELL, APPLICATION OF WOUND VAC USING PREVENA INCISIONAL  DRESSING;  Surgeon: Wallace Going, DO;  Location: [place];  Service: Plastics;  Laterality: N/A;  . CARDIAC CATHETERIZATION    . CORONARY ARTERY BYPASS GRAFT N/A 08/15/2019   Procedure: CORONARY ARTERY BYPASS GRAFTING (CABG), x 4 using bilateral IMAs and left radial artery .  LIMA TO LAD, RIMA TO PDA, RADIAL ARTERY TO CIRC AND SEQUENTIALLY TO OM1.;  Surgeon: [name], MD;  Location: [place];  Service: Open Heart Surgery;  Laterality: N/A;  . CORONARY STENT PLACEMENT  02/27/2014   distal rt/pd coronary       dr Einar Gip  . CYSTO/ BLADDER BIOPSY'S/ CAUTHERIZATION  01-14-2004  DR Gaynelle Arabian  . EXPLORATION POST OPERATIVE OPEN HEART N/A 08/16/2019  Procedure: Chest Closure S?P CABG WITH APPLICATION OF PREVENA  INCISIONAL WOUND VAC;  Surgeon: [name], MD;  Location: [place];  Service: Open Heart Surgery;  Laterality: N/A;  . EXPLORATION POST OPERATIVE OPEN HEART N/A 08/21/2019   Procedure: CHEST WASHOUT S/P OPEN CHEST;  Surgeon: [name], MD;  Location: [place];  Service: Open Heart Surgery;  Laterality: N/A;  Open chest with Esmark dressing with Ioban sealant coverage.  . EXPLORATION POST OPERATIVE OPEN HEART N/A 08/18/2019   Procedure: EXPLORATION POST OPERATIVE OPEN HEART (performed 04/23 on  unit);  Surgeon: [name], MD;  Location: [place];  Service: Open Heart Surgery;  Laterality: N/A;  . EXPLORATION POST OPERATIVE OPEN HEART N/A 08/24/2019   Procedure: CHEST WASHOUT POST OPERATIVE OPEN HEART;  Surgeon: [name], MD;  Location: [place];  Service: Open Heart Surgery;  Laterality: N/A;  . EXPLORATION POST OPERATIVE OPEN HEART N/A 08/29/2019   Procedure: CHEST WOUND WASHOUT POST OPERATIVE OPEN HEART;  Surgeon: [name], MD;  Location: [place] [employer];  Service: Open Heart Surgery;  Laterality: N/A;  . EXPLORATION POST OPERATIVE OPEN HEART N/A 09/04/2019   Procedure: MEDIASTINAL EXPLORATION WITH STERNAL WOUND IRRIGATION;  Surgeon: Grace Rockford, MD;  Location: [place];  Service: Open Heart Surgery;  Laterality: N/A;  . EXPLORATION POST OPERATIVE OPEN HEART N/A 09/14/2019   Procedure: EVACUATION OF HEMATOMA;  Surgeon: Grace Herb, MD;  Location: [place];  Service: Open Heart Surgery;  Laterality: N/A;  . IR FLUORO GUIDE CV LINE LEFT  09/19/2019  . IR GASTROSTOMY TUBE MOD SED  10/04/2019  . IR GASTROSTOMY TUBE REMOVAL  11/29/2019  . IR PATIENT EVAL TECH 0-60 MINS  09/29/2019  . IR US GUIDE VASC ACCESS LEFT  09/19/2019  . LAPAROSCOPIC LYSIS OF ADHESIONS N/A 09/06/2019   Procedure: LAPAROSCOPIC OMENTAL HARVEST;  Surgeon: Kinsinger, Arta Bruce, MD;  Location: [place];  Service: General;  Laterality: N/A;  . LEFT HEART CATH AND CORONARY ANGIOGRAPHY N/A 08/10/2019   Procedure: LEFT HEART CATH AND CORONARY ANGIOGRAPHY;  Surgeon: Nigel Mormon, MD;  Location: [place] [facility];  Service: Cardiovascular;  Laterality: N/A;  . LEFT HEART CATHETERIZATION WITH CORONARY ANGIOGRAM N/A 02/27/2014   Procedure: LEFT HEART CATHETERIZATION WITH CORONARY ANGIOGRAM;  Surgeon: Laverda Page, MD;  Location: [facility] CATH LAB;  Service: Cardiovascular;  Laterality: N/A;  . MEDIASTINAL EXPLORATION N/A 09/06/2019   Procedure: MEDIASTINAL EXPLORATION;  Surgeon: Grace Keenon, MD;  Location: [place];   Service: Thoracic;  Laterality: N/A;  . PECTORALIS FLAP  09/06/2019   Procedure: Pectoralis ADVANCEMENT Flap;  Surgeon: Wallace Going, DO;  Location: [place];  Service: Plastics;;  . PERCUTANEOUS CORONARY STENT INTERVENTION (PCI-S)  02/27/2014   Procedure: PERCUTANEOUS CORONARY STENT INTERVENTION (PCI-S);  Surgeon: Laverda Page, MD;  Location: [facility] CATH LAB;  Service: Cardiovascular;;  rt PDA  3.0/28mm Promus stent  . RADIAL ARTERY HARVEST Left 08/15/2019   Procedure: Radial Artery Harvest;  Surgeon: [name], MD;  Location: [place];  Service: Open Heart Surgery;  Laterality: Left;  . RIB PLATING N/A 09/06/2019   Procedure: STERNAL PLATING;  Surgeon: Grace Antoneo, MD;  Location: [place];  Service: Thoracic;  Laterality: N/A;  . TEE WITHOUT CARDIOVERSION N/A 08/15/2019   Procedure: TRANSESOPHAGEAL ECHOCARDIOGRAM (TEE);  Surgeon: [name], MD;  Location: [place];  Service: Open Heart Surgery;  Laterality: N/A;  . TRACHEOSTOMY TUBE PLACEMENT  08/29/2019   Procedure: Tracheostomy;  Surgeon: [name], MD;  Location: [facility] OR;  Service: Open Heart Surgery;;  . TRANSURETHRAL RESECTION OF PROSTATE  04/04/2012   Procedure: TRANSURETHRAL RESECTION OF THE PROSTATE WITH GYRUS INSTRUMENTS;  Surgeon: Ailene Rud, MD;  Location: [place];  Service: Urology;  Laterality: N/A;  . TRANSURETHRAL RESECTION OF PROSTATE N/A 09/27/2014   Procedure: TRANSURETHRAL RESECTION OF THE PROSTATE ;  Surgeon: Carolan Clines, MD;  Location: [facility];  Service: Urology;  Laterality: N/A;  . UPPER GASTROINTESTINAL ENDOSCOPY     Social History   Tobacco Use  . Smoking status: Former Smoker    Packs/day: 0.50    Years: 20.00    Pack years: 10.00    Types: Cigarettes    Quit date: 1975    Years since quitting: 46.8  . Smokeless tobacco: Never Used  Substance Use Topics  . Alcohol use: Not Currently    Alcohol/week: 0.0 standard drinks    Comment: 1 beer week rarely    ROS   Review of Systems  Cardiovascular: Negative for chest pain, dyspnea on exertion and leg swelling.  Gastrointestinal: Negative for melena.  Neurological: Positive for disturbances in coordination (left sided weakness) and loss of balance.    Objective  Blood pressure (!) 158/96, pulse (!) 106, resp. rate 17, height 5\' 9"  (1.753 m), weight 181 lb (82.1 kg), SpO2 96 %.  Vitals with BMI 03/05/2020 03/05/2020 02/09/2020  Height - 5\' 9"  5\' 9"   Weight - 181 lbs 168 lbs  BMI - 78.93 81.0  Systolic 175 102 585  Diastolic 96 277 87  Pulse 106 111 102    Physical Exam Constitutional:      Comments: He is moderately built and mildly obese in no acute distress.  Neck:     Thyroid: No thyromegaly.  Cardiovascular:     Rate and Rhythm: Normal rate and regular rhythm.     Pulses:          Carotid pulses are 2+ on the right side and 2+ on the left side.      Femoral pulses are 2+ on the right side and 2+ on the left side.      Popliteal pulses are 0 on the right side and 0 on the left side.       Dorsalis pedis pulses are 0 on the right side and 0 on the left side.       Posterior tibial pulses are 0 on the right side and 0 on the left side.     Heart sounds: Normal heart sounds. No murmur heard.  No gallop.      Comments: NO pedal edema, no JVD.  Pulmonary:     Effort: Pulmonary effort is normal.     Breath sounds: Normal breath sounds.  Chest:     Comments: Sternotomy scar has healed well Abdominal:     General: Bowel sounds are normal.     Palpations: Abdomen is soft.     Laboratory examination:   Recent Labs    10/16/19 0232 10/16/19 0232 10/17/19 0254 10/18/19 0046 12/20/19 1312  NA 131*   < > 131* 132* 136  K 3.9   < > 3.9 3.9 3.7  CL 94*   < > 95* 96* 97*  CO2 24   < > 21* 23 33*  GLUCOSE 84   < > 187* 175* 92  BUN 65*   < > 83* 37* 12  CREATININE 5.27*   < > 6.48* 3.55*  2.87*  CALCIUM 9.2   < > 9.0 9.1 9.0  GFRNONAA 10*  --  8* 16*  --   GFRAA 12*  --  9*  19*  --    < > = values in this interval not displayed.   CrCl cannot be calculated (Patient's most recent lab result is older than the maximum 21 days allowed.).  CMP Latest Ref Rng & Units 12/20/2019 10/18/2019 10/17/2019  Glucose 65 - 99 mg/dL 92 175(H) 187(H)  BUN 7 - 25 mg/dL 12 37(H) 83(H)  Creatinine 0.70 - 1.18 mg/dL 2.87(H) 3.55(H) 6.48(H)  Sodium 135 - 146 mmol/L 136 132(L) 131(L)  Potassium 3.5 - 5.3 mmol/L 3.7 3.9 3.9  Chloride 98 - 110 mmol/L 97(L) 96(L) 95(L)  CO2 20 - 32 mmol/L 33(H) 23 21(L)  Calcium 8.6 - 10.3 mg/dL 9.0 9.1 9.0  Total Protein 6.1 - 8.1 g/dL 5.7(L) - -  Total Bilirubin 0.2 - 1.2 mg/dL 0.4 - -  Alkaline Phos 38 - 126 U/L - - -  AST 10 - 35 U/L 21 - -  ALT 9 - 46 U/L 10 - -   CBC Latest Ref Rng & Units 10/17/2019 10/14/2019 10/12/2019  WBC 4.0 - 10.5 K/uL 12.3(H) 14.2(H) 14.6(H)  Hemoglobin 13.0 - 17.0 g/dL 9.2(L) 9.2(L) 9.3(L)  Hematocrit 39 - 52 % 29.1(L) 30.2(L) 30.3(L)  Platelets 150 - 400 K/uL 346 377 369   Lipid Panel Recent Labs    08/11/19 0426 08/15/19 1958 08/24/19 2003 08/26/19 1028 08/27/19 1611  CHOL 107  --   --   --   --   TRIG 125   < > 117 131 71  LDLCALC 40  --   --   --   --   VLDL 25  --   --   --   --   HDL 42  --   --   --   --   CHOLHDL 2.5  --   --   --   --    < > = values in this interval not displayed.    HEMOGLOBIN A1C Lab Results  Component Value Date   HGBA1C 4.9 12/20/2019   MPG 94 12/20/2019   TSH Recent Labs    04/19/19 0911  TSH 2.590   Medications and allergies  No Known Allergies   Current Outpatient Medications  Medication Instructions  . aspirin EC 81 mg, Oral, Daily  . atorvastatin (LIPITOR) 20 MG tablet TAKE 1 TABLET BY MOUTH EVERY DAY IN THE EVENING  . clonazePAM (KLONOPIN) 0.5 MG disintegrating tablet TAKE 1 TABLET BY MOUTH 2 TIMES DAILY.  Marland Kitchen clopidogrel (PLAVIX) 75 mg, Oral, Daily  . cromolyn (OPTICROM) 4 % ophthalmic solution SMARTSIG:1 Drop(s) In Eye(s) Every 6-12 Hours PRN  . [facility]  ACETAMINOPHEN 325 MG tablet TAKE 2 CAPS BY MOUTH EVERY 6 HOURS AS NEEDED FOR PAIN FOR TEMP 100F OR ABOVE  . hydrALAZINE (APRESOLINE) 25 mg, Oral, 3 times daily  . isosorbide dinitrate (ISORDIL) 30 mg, Oral, 3 times daily  . LASIX 40 MG tablet 2 tablets, Oral, 2 times daily  . metoprolol succinate (TOPROL-XL) 50 mg, Oral, Daily, Take with or immediately following a meal in morning. Hold dialysis days  . multivitamin (RENA-VIT) TABS tablet 1 tablet, Oral, Daily at bedtime  . nitroGLYCERIN (NITROSTAT) 0.4 mg, Sublingual, As directed  . Nystatin (GERHARDT'S BUTT CREAM) CREA 1 application, Topical, 4 times daily PRN  . oxyCODONE (OXY IR/ROXICODONE) 5 mg, Oral, Every 6 hours PRN  .  PREVIDENT 5000 BOOSTER PLUS 1.1 % PSTE 1 application, Oral, Daily  . SENNA-PLUS 8.6-50 MG tablet TAKE 2 TABLETS BY MOUTH AT BEDTIME FOR CONSTIPATION  . sertraline (ZOLOFT) 25 MG tablet TAKE 1 TABLET ONE TIME A DAY  . sevelamer carbonate (RENVELA) 1.6 g, Oral, 3 times daily with meals  . silodosin (RAPAFLO) 8 mg, Oral, Daily at bedtime  . tamsulosin (FLOMAX) 0.4 mg, Oral, Daily after breakfast  . Trospium Chloride 60 MG CP24 1 capsule, Oral, Daily   Radiology:  No results found.  Cardiac Studies:   Heart catheterization 2019/09/06: LM: Normal LAD: Ostial 95% stenosis Ramus: Ostial/prox 30% stenosis LCx: Ostial 75% stenosis RCA: Prox-mid diffuse 40% calcific disease. Distal RCA 70% ISR of 3.0 by 28 mm Promus placed 02/27/2014.  Pre-CABG Dopplers including ABI and carotid duplex: No significant carotid artery stenosis.  Antegrade vertebral artery flow. ABI normal, with triphasic waveforms at the level of the ankle.  Hx of CABG 08/15/2019: LIMA TO LAD, RIMA TO PDA, SEQUENTIAL RADIAL ARTERY TO Cx-OM1.  Echocardiogram 08/19/2019:  1. There prominent respiratory displacement of the interventricular  septum, consistent with enhanced ventricular interdependence, likely due  to constrictive physiology.. Left  ventricular ejection fraction, by  estimation, is 45 to 50%. The left ventricle  has mildly decreased function. The left ventricle demonstrates global  hypokinesis. Left ventricular diastolic parameters are consistent with  Grade II diastolic dysfunction (pseudonormalization). Elevated left atrial  pressure.  2. Right ventricular systolic function was not well visualized. The right  ventricular size is normal. Tricuspid regurgitation signal is inadequate  for assessing PA pressure.  3. No pericardial fluid is see, but the pericardium is very thick and  thrombus in the pericardial space cannot be ecluded.   EKG:    EKG 01/16/2020: Normal sinus rhythm/sinus tachycardia at rate of 110 bpm, normal axis, incomplete right bundle branch block.  No evidence of ischemia otherwise normal EKG.    Assessment     ICD-10-CM   1. Hypertension with heart disease  I11.9 hydrALAZINE (APRESOLINE) 25 MG tablet    isosorbide dinitrate (ISORDIL) 30 MG tablet  2. Atherosclerosis of native coronary artery of native heart without angina pectoris  I25.10 metoprolol succinate (TOPROL-XL) 50 MG 24 hr tablet    EKG 06/01/2019: Normal sinus rhythm at the rate of 98 bpm, leftward enlargement, normal axis.  No evidence of ischemia.   No significant change from  EKG 04/18/2019.    Recommendations:   Meds ordered this encounter  Medications  . metoprolol succinate (TOPROL-XL) 50 MG 24 hr tablet    Sig: Take 1 tablet (50 mg total) by mouth daily. Take with or immediately following a meal in morning. Hold dialysis days    Dispense:  30 tablet    Refill:  2  . hydrALAZINE (APRESOLINE) 25 MG tablet    Sig: Take 1 tablet (25 mg total) by mouth 3 (three) times daily.    Dispense:  90 tablet    Refill:  2  . isosorbide dinitrate (ISORDIL) 30 MG tablet    Sig: Take 1 tablet (30 mg total) by mouth 3 (three) times daily.    Dispense:  90 tablet    Refill:  2    Medications Discontinued During This Encounter  Medication  Reason  . metoprolol succinate (TOPROL-XL) 25 MG 24 hr tablet Reorder  The medications were discontinued by nephrology and also primary care physician due to low heart rate and difficulty with dialysis.   Christian [name]  is a  74 y.o. AA male with hypertension, hyperlipidemia, DM with stage 3 CKD,  CAD with  stenting to the PDA on 02/27/2014. Patient presented with unstable angina and NSTEMI on 08/09/2019 along with a very small subsegmental pulmonary embolism and discharged on 10/18/2019 after he underwent CABG x4 on 08/15/2019.  Cholangiography revealing complex ostial LAD stenosis with TIMI II flow and a calcific 75% ostial circumflex stenosis with reduced LVEF.   He had extremely complex postop complications including cardiogenic shock, cardiac arrest, needing internal CPR in the ICU after opening the chest, A. Fib needing cardioversion, closure office chest wall, developed acute renal failure leading to need for permanent dialysis, right brain infarct with left hemiparesis, multiple blood transfusions.  He is now off of dialysis since 02/01/2020.  His blood pressures remain high, he has also gained about 15 to 20 pounds in weight since I last saw him 2 months ago.  Blood pressure is also uncontrolled.  I have increased the dose of metoprolol succinate from 25 to 50 mg daily, I have added isosorbide dinitrate 30 mg 3 times daily along with hydralazine 25 mg p.o. 3 times daily.  I would like him to see me back in 6 weeks for follow-up of hypertension specifically.   Adrian Prows, MD, [facility] 03/05/2020, 3:24 PM Office: [phone]

## 2020-03-06 ENCOUNTER — Ambulatory Visit: Payer: Federal, State, Local not specified - PPO | Admitting: Cardiology

## 2020-03-08 DIAGNOSIS — Z48812 Encounter for surgical aftercare following surgery on the circulatory system: Secondary | ICD-10-CM | POA: Diagnosis not present

## 2020-03-08 DIAGNOSIS — Z951 Presence of aortocoronary bypass graft: Secondary | ICD-10-CM | POA: Diagnosis not present

## 2020-03-08 DIAGNOSIS — I4891 Unspecified atrial fibrillation: Secondary | ICD-10-CM | POA: Diagnosis not present

## 2020-03-08 DIAGNOSIS — D631 Anemia in chronic kidney disease: Secondary | ICD-10-CM | POA: Diagnosis not present

## 2020-03-08 DIAGNOSIS — Z992 Dependence on renal dialysis: Secondary | ICD-10-CM | POA: Diagnosis not present

## 2020-03-08 DIAGNOSIS — D62 Acute posthemorrhagic anemia: Secondary | ICD-10-CM | POA: Diagnosis not present

## 2020-03-08 DIAGNOSIS — I2510 Atherosclerotic heart disease of native coronary artery without angina pectoris: Secondary | ICD-10-CM | POA: Diagnosis not present

## 2020-03-08 DIAGNOSIS — I214 Non-ST elevation (NSTEMI) myocardial infarction: Secondary | ICD-10-CM | POA: Diagnosis not present

## 2020-03-08 DIAGNOSIS — E1142 Type 2 diabetes mellitus with diabetic polyneuropathy: Secondary | ICD-10-CM | POA: Diagnosis not present

## 2020-03-08 DIAGNOSIS — I132 Hypertensive heart and chronic kidney disease with heart failure and with stage 5 chronic kidney disease, or end stage renal disease: Secondary | ICD-10-CM | POA: Diagnosis not present

## 2020-03-08 DIAGNOSIS — Z9181 History of falling: Secondary | ICD-10-CM | POA: Diagnosis not present

## 2020-03-08 DIAGNOSIS — I509 Heart failure, unspecified: Secondary | ICD-10-CM | POA: Diagnosis not present

## 2020-03-08 DIAGNOSIS — Z7982 Long term (current) use of aspirin: Secondary | ICD-10-CM | POA: Diagnosis not present

## 2020-03-08 DIAGNOSIS — E1122 Type 2 diabetes mellitus with diabetic chronic kidney disease: Secondary | ICD-10-CM | POA: Diagnosis not present

## 2020-03-08 DIAGNOSIS — N186 End stage renal disease: Secondary | ICD-10-CM | POA: Diagnosis not present

## 2020-03-11 DIAGNOSIS — D631 Anemia in chronic kidney disease: Secondary | ICD-10-CM | POA: Diagnosis not present

## 2020-03-11 DIAGNOSIS — I4891 Unspecified atrial fibrillation: Secondary | ICD-10-CM | POA: Diagnosis not present

## 2020-03-11 DIAGNOSIS — Z951 Presence of aortocoronary bypass graft: Secondary | ICD-10-CM | POA: Diagnosis not present

## 2020-03-11 DIAGNOSIS — I2510 Atherosclerotic heart disease of native coronary artery without angina pectoris: Secondary | ICD-10-CM | POA: Diagnosis not present

## 2020-03-11 DIAGNOSIS — Z9181 History of falling: Secondary | ICD-10-CM | POA: Diagnosis not present

## 2020-03-11 DIAGNOSIS — N186 End stage renal disease: Secondary | ICD-10-CM | POA: Diagnosis not present

## 2020-03-11 DIAGNOSIS — I132 Hypertensive heart and chronic kidney disease with heart failure and with stage 5 chronic kidney disease, or end stage renal disease: Secondary | ICD-10-CM | POA: Diagnosis not present

## 2020-03-11 DIAGNOSIS — I214 Non-ST elevation (NSTEMI) myocardial infarction: Secondary | ICD-10-CM | POA: Diagnosis not present

## 2020-03-11 DIAGNOSIS — I509 Heart failure, unspecified: Secondary | ICD-10-CM | POA: Diagnosis not present

## 2020-03-11 DIAGNOSIS — Z7982 Long term (current) use of aspirin: Secondary | ICD-10-CM | POA: Diagnosis not present

## 2020-03-11 DIAGNOSIS — E1142 Type 2 diabetes mellitus with diabetic polyneuropathy: Secondary | ICD-10-CM | POA: Diagnosis not present

## 2020-03-11 DIAGNOSIS — E1122 Type 2 diabetes mellitus with diabetic chronic kidney disease: Secondary | ICD-10-CM | POA: Diagnosis not present

## 2020-03-11 DIAGNOSIS — Z48812 Encounter for surgical aftercare following surgery on the circulatory system: Secondary | ICD-10-CM | POA: Diagnosis not present

## 2020-03-11 DIAGNOSIS — Z992 Dependence on renal dialysis: Secondary | ICD-10-CM | POA: Diagnosis not present

## 2020-03-11 DIAGNOSIS — D62 Acute posthemorrhagic anemia: Secondary | ICD-10-CM | POA: Diagnosis not present

## 2020-03-13 DIAGNOSIS — Z9181 History of falling: Secondary | ICD-10-CM | POA: Diagnosis not present

## 2020-03-13 DIAGNOSIS — E1122 Type 2 diabetes mellitus with diabetic chronic kidney disease: Secondary | ICD-10-CM | POA: Diagnosis not present

## 2020-03-13 DIAGNOSIS — Z951 Presence of aortocoronary bypass graft: Secondary | ICD-10-CM | POA: Diagnosis not present

## 2020-03-13 DIAGNOSIS — Z992 Dependence on renal dialysis: Secondary | ICD-10-CM | POA: Diagnosis not present

## 2020-03-13 DIAGNOSIS — I509 Heart failure, unspecified: Secondary | ICD-10-CM | POA: Diagnosis not present

## 2020-03-13 DIAGNOSIS — Z7982 Long term (current) use of aspirin: Secondary | ICD-10-CM | POA: Diagnosis not present

## 2020-03-13 DIAGNOSIS — I132 Hypertensive heart and chronic kidney disease with heart failure and with stage 5 chronic kidney disease, or end stage renal disease: Secondary | ICD-10-CM | POA: Diagnosis not present

## 2020-03-13 DIAGNOSIS — E1142 Type 2 diabetes mellitus with diabetic polyneuropathy: Secondary | ICD-10-CM | POA: Diagnosis not present

## 2020-03-13 DIAGNOSIS — I214 Non-ST elevation (NSTEMI) myocardial infarction: Secondary | ICD-10-CM | POA: Diagnosis not present

## 2020-03-13 DIAGNOSIS — I2510 Atherosclerotic heart disease of native coronary artery without angina pectoris: Secondary | ICD-10-CM | POA: Diagnosis not present

## 2020-03-13 DIAGNOSIS — D631 Anemia in chronic kidney disease: Secondary | ICD-10-CM | POA: Diagnosis not present

## 2020-03-13 DIAGNOSIS — N186 End stage renal disease: Secondary | ICD-10-CM | POA: Diagnosis not present

## 2020-03-13 DIAGNOSIS — I4891 Unspecified atrial fibrillation: Secondary | ICD-10-CM | POA: Diagnosis not present

## 2020-03-13 DIAGNOSIS — Z48812 Encounter for surgical aftercare following surgery on the circulatory system: Secondary | ICD-10-CM | POA: Diagnosis not present

## 2020-03-13 DIAGNOSIS — D62 Acute posthemorrhagic anemia: Secondary | ICD-10-CM | POA: Diagnosis not present

## 2020-03-13 NOTE — Telephone Encounter (Signed)
    Real Name: [place] Name: Christian [name] Phone #: [phone] Service Requested: extend OT Frequency of Visits: 1x week for 3 weeks

## 2020-03-14 DIAGNOSIS — Z992 Dependence on renal dialysis: Secondary | ICD-10-CM | POA: Diagnosis not present

## 2020-03-14 DIAGNOSIS — R262 Difficulty in walking, not elsewhere classified: Secondary | ICD-10-CM | POA: Diagnosis not present

## 2020-03-14 DIAGNOSIS — I214 Non-ST elevation (NSTEMI) myocardial infarction: Secondary | ICD-10-CM | POA: Diagnosis not present

## 2020-03-14 DIAGNOSIS — N186 End stage renal disease: Secondary | ICD-10-CM | POA: Diagnosis not present

## 2020-03-18 ENCOUNTER — Other Ambulatory Visit: Payer: Self-pay

## 2020-03-18 ENCOUNTER — Other Ambulatory Visit: Payer: Self-pay | Admitting: Cardiology

## 2020-03-18 ENCOUNTER — Telehealth: Payer: Self-pay | Admitting: Internal Medicine

## 2020-03-18 DIAGNOSIS — Z951 Presence of aortocoronary bypass graft: Secondary | ICD-10-CM | POA: Diagnosis not present

## 2020-03-18 DIAGNOSIS — D631 Anemia in chronic kidney disease: Secondary | ICD-10-CM | POA: Diagnosis not present

## 2020-03-18 DIAGNOSIS — R600 Localized edema: Secondary | ICD-10-CM

## 2020-03-18 DIAGNOSIS — E1142 Type 2 diabetes mellitus with diabetic polyneuropathy: Secondary | ICD-10-CM | POA: Diagnosis not present

## 2020-03-18 DIAGNOSIS — Z7982 Long term (current) use of aspirin: Secondary | ICD-10-CM | POA: Diagnosis not present

## 2020-03-18 DIAGNOSIS — I4891 Unspecified atrial fibrillation: Secondary | ICD-10-CM | POA: Diagnosis not present

## 2020-03-18 DIAGNOSIS — I2510 Atherosclerotic heart disease of native coronary artery without angina pectoris: Secondary | ICD-10-CM

## 2020-03-18 DIAGNOSIS — I509 Heart failure, unspecified: Secondary | ICD-10-CM | POA: Diagnosis not present

## 2020-03-18 DIAGNOSIS — N186 End stage renal disease: Secondary | ICD-10-CM | POA: Diagnosis not present

## 2020-03-18 DIAGNOSIS — Z48812 Encounter for surgical aftercare following surgery on the circulatory system: Secondary | ICD-10-CM | POA: Diagnosis not present

## 2020-03-18 DIAGNOSIS — E1122 Type 2 diabetes mellitus with diabetic chronic kidney disease: Secondary | ICD-10-CM | POA: Diagnosis not present

## 2020-03-18 DIAGNOSIS — Z992 Dependence on renal dialysis: Secondary | ICD-10-CM | POA: Diagnosis not present

## 2020-03-18 DIAGNOSIS — I132 Hypertensive heart and chronic kidney disease with heart failure and with stage 5 chronic kidney disease, or end stage renal disease: Secondary | ICD-10-CM | POA: Diagnosis not present

## 2020-03-18 DIAGNOSIS — I214 Non-ST elevation (NSTEMI) myocardial infarction: Secondary | ICD-10-CM | POA: Diagnosis not present

## 2020-03-18 DIAGNOSIS — Z931 Gastrostomy status: Secondary | ICD-10-CM | POA: Diagnosis not present

## 2020-03-18 DIAGNOSIS — Z9181 History of falling: Secondary | ICD-10-CM | POA: Diagnosis not present

## 2020-03-18 DIAGNOSIS — D62 Acute posthemorrhagic anemia: Secondary | ICD-10-CM | POA: Diagnosis not present

## 2020-03-18 MED ORDER — FUROSEMIDE 40 MG PO TABS: 40.0000 mg | ORAL_TABLET | Freq: Two times a day (BID) | ORAL | 1 refills | Status: DC

## 2020-03-18 NOTE — Telephone Encounter (Signed)
   Order # [id number] faxed to [facility] Fax [phone]

## 2020-03-25 ENCOUNTER — Encounter: Payer: Self-pay | Admitting: Internal Medicine

## 2020-03-25 ENCOUNTER — Other Ambulatory Visit: Payer: Self-pay

## 2020-03-25 ENCOUNTER — Ambulatory Visit (INDEPENDENT_AMBULATORY_CARE_PROVIDER_SITE_OTHER): Payer: Medicare Other | Admitting: Internal Medicine

## 2020-03-25 VITALS — BP 140/78 | HR 99 | Temp 98.1°F | Ht 69.0 in | Wt 187.2 lb

## 2020-03-25 DIAGNOSIS — E785 Hyperlipidemia, unspecified: Secondary | ICD-10-CM

## 2020-03-25 DIAGNOSIS — I10 Essential (primary) hypertension: Secondary | ICD-10-CM

## 2020-03-25 DIAGNOSIS — N184 Chronic kidney disease, stage 4 (severe): Secondary | ICD-10-CM

## 2020-03-25 DIAGNOSIS — E1169 Type 2 diabetes mellitus with other specified complication: Secondary | ICD-10-CM | POA: Diagnosis not present

## 2020-03-25 DIAGNOSIS — I700 Atherosclerosis of aorta: Secondary | ICD-10-CM | POA: Diagnosis not present

## 2020-03-25 DIAGNOSIS — E1142 Type 2 diabetes mellitus with diabetic polyneuropathy: Secondary | ICD-10-CM

## 2020-03-25 LAB — RENAL FUNCTION PANEL
Albumin: 4.1 g/dL (ref 3.5–5.2)
BUN: 38 mg/dL — ABNORMAL HIGH (ref 6–23)
CO2: 28 mEq/L (ref 19–32)
Calcium: 9.3 mg/dL (ref 8.4–10.5)
Chloride: 106 mEq/L (ref 96–112)
Creatinine, Ser: 3.18 mg/dL — ABNORMAL HIGH (ref 0.40–1.50)
GFR: 18.69 mL/min — ABNORMAL LOW (ref >60.00)
Glucose, Bld: 98 mg/dL (ref 70–99)
Phosphorus: 4.6 mg/dL (ref 2.3–4.6)
Potassium: 4.3 mEq/L (ref 3.5–5.1)
Sodium: 144 mEq/L (ref 135–145)

## 2020-03-25 LAB — HEMOGLOBIN A1C: Hgb A1c MFr Bld: 5.4 % (ref 4.6–6.5)

## 2020-03-25 NOTE — Patient Instructions (Signed)
We will check the labs today. 

## 2020-03-25 NOTE — Progress Notes (Signed)
   Subjective:   Patient ID: Christian [name], male    DOB: Jan 17, 1946, 74 y.o.   MRN: [id number]  HPI The patient is a 75 YO man coming in for follow up diabetes (taking no medications for sugars at this time, recent ESRD on dialysis due to complications after CABG but is off dialysis for the last several months, seeing nephrology and they are monitoring renal function carefully, he is much better with fluid intake now and taking medications regularly) and recent CABG (very complicated with extended recovery and out of rehab back in August, admits to doing well, denies chest pains, is active around the house, sometimes pains if he lifts things he should not, taking his medications as prescribed), and blood pressure (BP initially elevated here but recheck closer to normal, he is taking hydralazine and lasix and isordil and toprol and denies missing doses or side effects, cardiology recently adjusted meds a few weeks ago, he has made those changes, denies chest pains or headaches).   Review of Systems  Constitutional: Positive for activity change, appetite change and fatigue.  HENT: Negative.   Eyes: Negative.   Respiratory: Negative for cough, chest tightness and shortness of breath.   Cardiovascular: Negative for chest pain, palpitations and leg swelling.  Gastrointestinal: Negative for abdominal distention, abdominal pain, constipation, diarrhea, nausea and vomiting.  Musculoskeletal: Negative.   Skin: Negative.   Neurological: Negative.   Psychiatric/Behavioral: Negative.     Objective:  Physical Exam Constitutional:      Appearance: He is well-developed. He is obese.  HENT:     Head: Normocephalic and atraumatic.  Cardiovascular:     Rate and Rhythm: Normal rate and regular rhythm.  Pulmonary:     Effort: Pulmonary effort is normal. No respiratory distress.     Breath sounds: Normal breath sounds. No wheezing or rales.  Abdominal:     General: Bowel sounds are normal. There is no  distension.     Palpations: Abdomen is soft.     Tenderness: There is no abdominal tenderness. There is no rebound.  Musculoskeletal:     Cervical back: Normal range of motion.  Skin:    General: Skin is warm and dry.  Neurological:     Mental Status: He is alert and oriented to person, place, and time.     Coordination: Coordination normal.     Vitals:   03/25/20 1110 03/25/20 1130  BP: (!) 170/80 140/78  Pulse: 99   Temp: 98.1 F (36.7 C)   TempSrc: Oral   SpO2: 97%   Weight: 187 lb 3.2 oz (84.9 kg)   Height: 5\' 9"  (1.753 m)     This visit occurred during the SARS-CoV-2 public health emergency.  Safety protocols were in place, including screening questions prior to the visit, additional usage of staff PPE, and extensive cleaning of exam room while observing appropriate contact time as indicated for disinfecting solutions.   Assessment & Plan:

## 2020-03-26 DIAGNOSIS — Z992 Dependence on renal dialysis: Secondary | ICD-10-CM | POA: Diagnosis not present

## 2020-03-26 DIAGNOSIS — I132 Hypertensive heart and chronic kidney disease with heart failure and with stage 5 chronic kidney disease, or end stage renal disease: Secondary | ICD-10-CM | POA: Diagnosis not present

## 2020-03-26 DIAGNOSIS — Z9181 History of falling: Secondary | ICD-10-CM | POA: Diagnosis not present

## 2020-03-26 DIAGNOSIS — I214 Non-ST elevation (NSTEMI) myocardial infarction: Secondary | ICD-10-CM | POA: Diagnosis not present

## 2020-03-26 DIAGNOSIS — I4891 Unspecified atrial fibrillation: Secondary | ICD-10-CM | POA: Diagnosis not present

## 2020-03-26 DIAGNOSIS — E1122 Type 2 diabetes mellitus with diabetic chronic kidney disease: Secondary | ICD-10-CM | POA: Diagnosis not present

## 2020-03-26 DIAGNOSIS — N186 End stage renal disease: Secondary | ICD-10-CM | POA: Diagnosis not present

## 2020-03-26 DIAGNOSIS — Z7982 Long term (current) use of aspirin: Secondary | ICD-10-CM | POA: Diagnosis not present

## 2020-03-26 DIAGNOSIS — D631 Anemia in chronic kidney disease: Secondary | ICD-10-CM | POA: Diagnosis not present

## 2020-03-26 DIAGNOSIS — Z951 Presence of aortocoronary bypass graft: Secondary | ICD-10-CM | POA: Diagnosis not present

## 2020-03-26 DIAGNOSIS — Z48812 Encounter for surgical aftercare following surgery on the circulatory system: Secondary | ICD-10-CM | POA: Diagnosis not present

## 2020-03-26 DIAGNOSIS — E1142 Type 2 diabetes mellitus with diabetic polyneuropathy: Secondary | ICD-10-CM | POA: Diagnosis not present

## 2020-03-26 DIAGNOSIS — I2510 Atherosclerotic heart disease of native coronary artery without angina pectoris: Secondary | ICD-10-CM | POA: Diagnosis not present

## 2020-03-26 DIAGNOSIS — I509 Heart failure, unspecified: Secondary | ICD-10-CM | POA: Diagnosis not present

## 2020-03-26 DIAGNOSIS — D62 Acute posthemorrhagic anemia: Secondary | ICD-10-CM | POA: Diagnosis not present

## 2020-03-27 ENCOUNTER — Telehealth: Payer: Self-pay | Admitting: Internal Medicine

## 2020-03-27 DIAGNOSIS — N179 Acute kidney failure, unspecified: Secondary | ICD-10-CM | POA: Insufficient documentation

## 2020-03-27 DIAGNOSIS — I700 Atherosclerosis of aorta: Secondary | ICD-10-CM

## 2020-03-27 DIAGNOSIS — N184 Chronic kidney disease, stage 4 (severe): Secondary | ICD-10-CM

## 2020-03-27 HISTORY — DX: Atherosclerosis of aorta: I70.0

## 2020-03-27 HISTORY — DX: Chronic kidney disease, stage 4 (severe): N18.4

## 2020-03-27 NOTE — Telephone Encounter (Signed)
Verbal orders given to extend [facility] OT for once/week for 2 weeks.  Mallory verb understanding.

## 2020-03-27 NOTE — Assessment & Plan Note (Signed)
Currently bordering stage 4-5. Checking renal function and he is having monthly visits with nephrology. Was on dialysis for some months after recent complicated hospital stay for CABG. We talked about potential need for dialysis again in the future especially if BP if not controlled. Advised to follow recommendations on fluid restriction well.

## 2020-03-27 NOTE — Assessment & Plan Note (Signed)
Foot exam done, checking HgA1c. Diet controlled currently was previously likely managed by dialysis. Will adjust as needed. On statin.

## 2020-03-27 NOTE — Telephone Encounter (Signed)
  Mallory from [facility] calling to get verbal orders to extend home health occupational therapy for 1 time a week for two weeks. Mallory states she needs these orders by the end of the day today. Mallory- [phone] Okay to LVM

## 2020-03-27 NOTE — Assessment & Plan Note (Signed)
BP mildly elevated today and recent changes to meds by cardiology. Will continue to adjust as needed at upcoming nephrology visit. We talked about how good BP control will help delay or avoid going back on dialysis.

## 2020-03-27 NOTE — Assessment & Plan Note (Signed)
On statin, advised to continue.

## 2020-03-27 NOTE — Assessment & Plan Note (Signed)
Taking lipitor 20 mg daily.

## 2020-03-27 NOTE — Telephone Encounter (Signed)
Fine

## 2020-03-29 DIAGNOSIS — E1122 Type 2 diabetes mellitus with diabetic chronic kidney disease: Secondary | ICD-10-CM | POA: Diagnosis not present

## 2020-03-29 DIAGNOSIS — D62 Acute posthemorrhagic anemia: Secondary | ICD-10-CM | POA: Diagnosis not present

## 2020-03-29 DIAGNOSIS — E1142 Type 2 diabetes mellitus with diabetic polyneuropathy: Secondary | ICD-10-CM | POA: Diagnosis not present

## 2020-03-29 DIAGNOSIS — I132 Hypertensive heart and chronic kidney disease with heart failure and with stage 5 chronic kidney disease, or end stage renal disease: Secondary | ICD-10-CM | POA: Diagnosis not present

## 2020-03-29 DIAGNOSIS — D631 Anemia in chronic kidney disease: Secondary | ICD-10-CM | POA: Diagnosis not present

## 2020-03-29 DIAGNOSIS — I2510 Atherosclerotic heart disease of native coronary artery without angina pectoris: Secondary | ICD-10-CM | POA: Diagnosis not present

## 2020-03-29 DIAGNOSIS — I4891 Unspecified atrial fibrillation: Secondary | ICD-10-CM | POA: Diagnosis not present

## 2020-03-29 DIAGNOSIS — Z951 Presence of aortocoronary bypass graft: Secondary | ICD-10-CM | POA: Diagnosis not present

## 2020-03-29 DIAGNOSIS — N186 End stage renal disease: Secondary | ICD-10-CM | POA: Diagnosis not present

## 2020-03-29 DIAGNOSIS — I509 Heart failure, unspecified: Secondary | ICD-10-CM | POA: Diagnosis not present

## 2020-03-29 DIAGNOSIS — Z992 Dependence on renal dialysis: Secondary | ICD-10-CM | POA: Diagnosis not present

## 2020-03-29 DIAGNOSIS — Z9181 History of falling: Secondary | ICD-10-CM | POA: Diagnosis not present

## 2020-03-29 DIAGNOSIS — Z7982 Long term (current) use of aspirin: Secondary | ICD-10-CM | POA: Diagnosis not present

## 2020-03-29 DIAGNOSIS — Z48812 Encounter for surgical aftercare following surgery on the circulatory system: Secondary | ICD-10-CM | POA: Diagnosis not present

## 2020-03-29 DIAGNOSIS — I214 Non-ST elevation (NSTEMI) myocardial infarction: Secondary | ICD-10-CM | POA: Diagnosis not present

## 2020-03-31 ENCOUNTER — Other Ambulatory Visit: Payer: Self-pay | Admitting: Internal Medicine

## 2020-04-01 DIAGNOSIS — I4891 Unspecified atrial fibrillation: Secondary | ICD-10-CM | POA: Diagnosis not present

## 2020-04-01 DIAGNOSIS — N186 End stage renal disease: Secondary | ICD-10-CM | POA: Diagnosis not present

## 2020-04-01 DIAGNOSIS — E1142 Type 2 diabetes mellitus with diabetic polyneuropathy: Secondary | ICD-10-CM | POA: Diagnosis not present

## 2020-04-01 DIAGNOSIS — D631 Anemia in chronic kidney disease: Secondary | ICD-10-CM | POA: Diagnosis not present

## 2020-04-01 DIAGNOSIS — Z9181 History of falling: Secondary | ICD-10-CM | POA: Diagnosis not present

## 2020-04-01 DIAGNOSIS — I509 Heart failure, unspecified: Secondary | ICD-10-CM | POA: Diagnosis not present

## 2020-04-01 DIAGNOSIS — I214 Non-ST elevation (NSTEMI) myocardial infarction: Secondary | ICD-10-CM | POA: Diagnosis not present

## 2020-04-01 DIAGNOSIS — I2510 Atherosclerotic heart disease of native coronary artery without angina pectoris: Secondary | ICD-10-CM | POA: Diagnosis not present

## 2020-04-01 DIAGNOSIS — Z951 Presence of aortocoronary bypass graft: Secondary | ICD-10-CM | POA: Diagnosis not present

## 2020-04-01 DIAGNOSIS — Z992 Dependence on renal dialysis: Secondary | ICD-10-CM | POA: Diagnosis not present

## 2020-04-01 DIAGNOSIS — Z48812 Encounter for surgical aftercare following surgery on the circulatory system: Secondary | ICD-10-CM | POA: Diagnosis not present

## 2020-04-01 DIAGNOSIS — Z7982 Long term (current) use of aspirin: Secondary | ICD-10-CM | POA: Diagnosis not present

## 2020-04-01 DIAGNOSIS — D62 Acute posthemorrhagic anemia: Secondary | ICD-10-CM | POA: Diagnosis not present

## 2020-04-01 DIAGNOSIS — E1122 Type 2 diabetes mellitus with diabetic chronic kidney disease: Secondary | ICD-10-CM | POA: Diagnosis not present

## 2020-04-01 DIAGNOSIS — I132 Hypertensive heart and chronic kidney disease with heart failure and with stage 5 chronic kidney disease, or end stage renal disease: Secondary | ICD-10-CM | POA: Diagnosis not present

## 2020-04-02 DIAGNOSIS — I214 Non-ST elevation (NSTEMI) myocardial infarction: Secondary | ICD-10-CM | POA: Diagnosis not present

## 2020-04-02 DIAGNOSIS — Z951 Presence of aortocoronary bypass graft: Secondary | ICD-10-CM | POA: Diagnosis not present

## 2020-04-02 DIAGNOSIS — Z992 Dependence on renal dialysis: Secondary | ICD-10-CM | POA: Diagnosis not present

## 2020-04-02 DIAGNOSIS — Z7982 Long term (current) use of aspirin: Secondary | ICD-10-CM | POA: Diagnosis not present

## 2020-04-02 DIAGNOSIS — I509 Heart failure, unspecified: Secondary | ICD-10-CM | POA: Diagnosis not present

## 2020-04-02 DIAGNOSIS — D631 Anemia in chronic kidney disease: Secondary | ICD-10-CM | POA: Diagnosis not present

## 2020-04-02 DIAGNOSIS — I4891 Unspecified atrial fibrillation: Secondary | ICD-10-CM | POA: Diagnosis not present

## 2020-04-02 DIAGNOSIS — Z9181 History of falling: Secondary | ICD-10-CM | POA: Diagnosis not present

## 2020-04-02 DIAGNOSIS — E1142 Type 2 diabetes mellitus with diabetic polyneuropathy: Secondary | ICD-10-CM | POA: Diagnosis not present

## 2020-04-02 DIAGNOSIS — E1122 Type 2 diabetes mellitus with diabetic chronic kidney disease: Secondary | ICD-10-CM | POA: Diagnosis not present

## 2020-04-02 DIAGNOSIS — I2510 Atherosclerotic heart disease of native coronary artery without angina pectoris: Secondary | ICD-10-CM | POA: Diagnosis not present

## 2020-04-02 DIAGNOSIS — Z48812 Encounter for surgical aftercare following surgery on the circulatory system: Secondary | ICD-10-CM | POA: Diagnosis not present

## 2020-04-02 DIAGNOSIS — D62 Acute posthemorrhagic anemia: Secondary | ICD-10-CM | POA: Diagnosis not present

## 2020-04-02 DIAGNOSIS — N186 End stage renal disease: Secondary | ICD-10-CM | POA: Diagnosis not present

## 2020-04-02 DIAGNOSIS — I132 Hypertensive heart and chronic kidney disease with heart failure and with stage 5 chronic kidney disease, or end stage renal disease: Secondary | ICD-10-CM | POA: Diagnosis not present

## 2020-04-03 DIAGNOSIS — Z9181 History of falling: Secondary | ICD-10-CM | POA: Diagnosis not present

## 2020-04-03 DIAGNOSIS — Z48812 Encounter for surgical aftercare following surgery on the circulatory system: Secondary | ICD-10-CM | POA: Diagnosis not present

## 2020-04-03 DIAGNOSIS — Z992 Dependence on renal dialysis: Secondary | ICD-10-CM | POA: Diagnosis not present

## 2020-04-03 DIAGNOSIS — D631 Anemia in chronic kidney disease: Secondary | ICD-10-CM | POA: Diagnosis not present

## 2020-04-03 DIAGNOSIS — I4891 Unspecified atrial fibrillation: Secondary | ICD-10-CM | POA: Diagnosis not present

## 2020-04-03 DIAGNOSIS — N186 End stage renal disease: Secondary | ICD-10-CM | POA: Diagnosis not present

## 2020-04-03 DIAGNOSIS — Z7982 Long term (current) use of aspirin: Secondary | ICD-10-CM | POA: Diagnosis not present

## 2020-04-03 DIAGNOSIS — Z951 Presence of aortocoronary bypass graft: Secondary | ICD-10-CM | POA: Diagnosis not present

## 2020-04-03 DIAGNOSIS — I2510 Atherosclerotic heart disease of native coronary artery without angina pectoris: Secondary | ICD-10-CM | POA: Diagnosis not present

## 2020-04-03 DIAGNOSIS — I214 Non-ST elevation (NSTEMI) myocardial infarction: Secondary | ICD-10-CM | POA: Diagnosis not present

## 2020-04-03 DIAGNOSIS — E1142 Type 2 diabetes mellitus with diabetic polyneuropathy: Secondary | ICD-10-CM | POA: Diagnosis not present

## 2020-04-03 DIAGNOSIS — N2581 Secondary hyperparathyroidism of renal origin: Secondary | ICD-10-CM | POA: Diagnosis not present

## 2020-04-03 DIAGNOSIS — I129 Hypertensive chronic kidney disease with stage 1 through stage 4 chronic kidney disease, or unspecified chronic kidney disease: Secondary | ICD-10-CM | POA: Diagnosis not present

## 2020-04-03 DIAGNOSIS — D62 Acute posthemorrhagic anemia: Secondary | ICD-10-CM | POA: Diagnosis not present

## 2020-04-03 DIAGNOSIS — I132 Hypertensive heart and chronic kidney disease with heart failure and with stage 5 chronic kidney disease, or end stage renal disease: Secondary | ICD-10-CM | POA: Diagnosis not present

## 2020-04-03 DIAGNOSIS — I509 Heart failure, unspecified: Secondary | ICD-10-CM | POA: Diagnosis not present

## 2020-04-03 DIAGNOSIS — E1122 Type 2 diabetes mellitus with diabetic chronic kidney disease: Secondary | ICD-10-CM | POA: Diagnosis not present

## 2020-04-03 DIAGNOSIS — N184 Chronic kidney disease, stage 4 (severe): Secondary | ICD-10-CM | POA: Diagnosis not present

## 2020-04-11 ENCOUNTER — Telehealth: Payer: Self-pay | Admitting: Pharmacist

## 2020-04-11 NOTE — Addendum Note (Signed)
Addended by: Cresenciano Lick on: 04/11/2020 04:44 PM   Modules accepted: Orders

## 2020-04-11 NOTE — Progress Notes (Signed)
Chronic Care Management Pharmacy Assistant   Name: Christian [name]  MRN: [id number] DOB: March 31, 1946  Reason for Encounter: Initial Questions   PCP : Hoyt Koch, MD  Allergies:  No Known Allergies  Medications: Outpatient Encounter Medications as of 04/11/2020  Medication Sig  . clonazePAM (KLONOPIN) 0.5 MG disintegrating tablet TAKE 1 TABLET BY MOUTH TWICE A DAY  . aspirin EC 81 MG tablet Take 81 mg by mouth daily.  Marland Kitchen atorvastatin (LIPITOR) 20 MG tablet TAKE 1 TABLET BY MOUTH EVERY DAY IN THE EVENING (Patient taking differently: Take 20 mg by mouth every evening. )  . clopidogrel (PLAVIX) 75 MG tablet Take 1 tablet (75 mg total) by mouth daily.  . cromolyn (OPTICROM) 4 % ophthalmic solution SMARTSIG:1 Drop(s) In Eye(s) Every 6-12 Hours PRN  . [facility] ACETAMINOPHEN 325 MG tablet TAKE 2 CAPS BY MOUTH EVERY 6 HOURS AS NEEDED FOR PAIN FOR TEMP 100F OR ABOVE  . furosemide (LASIX) 40 MG tablet Take 1 tablet (40 mg total) by mouth 2 (two) times daily.  . hydrALAZINE (APRESOLINE) 25 MG tablet Take 1 tablet (25 mg total) by mouth 3 (three) times daily.  . isosorbide dinitrate (ISORDIL) 30 MG tablet Take 1 tablet (30 mg total) by mouth 3 (three) times daily.  . metoprolol succinate (TOPROL-XL) 50 MG 24 hr tablet Take 1 tablet (50 mg total) by mouth daily. Take with or immediately following a meal in morning. Hold dialysis days  . multivitamin (RENA-VIT) TABS tablet Take 1 tablet by mouth at bedtime.  . nitroGLYCERIN (NITROSTAT) 0.4 MG SL tablet Place 0.4 mg under the tongue as directed.  Marland Kitchen Nystatin (GERHARDT'S BUTT CREAM) CREA Apply 1 application topically 4 (four) times daily as needed for irritation.  Marland Kitchen PREVIDENT 5000 BOOSTER PLUS 1.1 % PSTE Take 1 application by mouth daily.   . SENNA-PLUS 8.6-50 MG tablet TAKE 2 TABLETS BY MOUTH AT BEDTIME FOR CONSTIPATION  . sertraline (ZOLOFT) 25 MG tablet TAKE 1 TABLET ONE TIME A DAY  . silodosin (RAPAFLO) 8 MG CAPS capsule Take 8 mg by mouth  at bedtime.  . tamsulosin (FLOMAX) 0.4 MG CAPS capsule Take 0.4 mg by mouth daily after breakfast.   . Trospium Chloride 60 MG CP24 Take 1 capsule by mouth daily.   No facility-administered encounter medications on file as of 04/11/2020.    Current Diagnosis: Patient Active Problem List   Diagnosis Date Noted  . CKD (chronic kidney disease) stage 4, GFR 15-29 ml/min (HCC) 03/27/2020  . Aortic atherosclerosis ([place]) 03/27/2020  . 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  . Pruritus, unspecified 11/28/2019  . Unspecified protein-calorie malnutrition ([place]) 10/27/2019  . Secondary hyperparathyroidism of renal origin ([place]) 10/17/2019  . Coronary artery disease 08/15/2019  . 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  LIMA TO LAD, RIMA TO PDA, RADIAL ARTERY TO CIRC AND SEQUENTIALLY TO OM1. 08/15/2019  . Ischemic cardiomyopathy   . Swelling of lower extremity   . History of coronary artery stent placement   . Pulmonary embolism ([place]) 08/09/2019  . Elevated liver enzymes 08/09/2019  . Left leg weakness 11/23/2018  . CAD (coronary artery disease) 07/30/2018  . Routine general medical examination at a health care facility 08/30/2016  . Diabetes mellitus with polyneuropathy ([place]) 10/10/2014  . Benign prostatic hyperplasia 09/27/2014  . Stable angina pectoris ([place]) 02/27/2014  . S/P PTCA (percutaneous transluminal coronary angioplasty) 02/27/2014  . Hyperlipidemia associated with type 2 diabetes mellitus ([place]) 11/12/2009  .  MULTIPLE SCLEROSIS 11/12/2009  . HTN (hypertension) 11/12/2009    Goals Addressed   None     Follow-Up:  Pharmacist Review   Have you seen any other providers since your last visit? The patient stated that he went to the kidney center to see Dr. Royce Macadamia  Any changes in your medications or health? The patient states that there has been no changes in medications or health  Any side effects from any medications? The patient states that  he has no side affects from any medications.  Do you have an symptoms or problems not managed by your medications? The patient does not have any symptoms or problems that are not managed by medications  Any concerns about your health right now? The patient states that his heart rate has been elevated some up around 100, feels like he is running  Has your provider asked that you check blood pressure, blood sugar, or follow special diet at home? The patient states that he does take blood pressure regularly and that on 04/10/20 it was 136/80, he does not check his blood sugar, but does watch what he eats and tries to cut out his salt and fluid intake.  Do you get any type of exercise on a regular basis? The patient states that he is active around the house and is waiting to do out patient therapy soon.  Can you think of a goal you would like to reach for your health? The patient states that he would like to get his left side leg and hip to move  Do you have any problems getting your medications? The patient states that he does not have any problems with getting his medications from the pharmacy  Is there anything that you would like to discuss during the appointment? The patient states that he has nothing specific to discuss at this time.  Please bring medications and supplements to appointment   Wendy Poet, [place]

## 2020-04-12 ENCOUNTER — Ambulatory Visit: Payer: Medicare Other | Admitting: Pharmacist

## 2020-04-12 ENCOUNTER — Other Ambulatory Visit: Payer: Self-pay

## 2020-04-12 DIAGNOSIS — E1169 Type 2 diabetes mellitus with other specified complication: Secondary | ICD-10-CM

## 2020-04-12 DIAGNOSIS — E119 Type 2 diabetes mellitus without complications: Secondary | ICD-10-CM

## 2020-04-12 DIAGNOSIS — Z951 Presence of aortocoronary bypass graft: Secondary | ICD-10-CM

## 2020-04-12 DIAGNOSIS — I10 Essential (primary) hypertension: Secondary | ICD-10-CM

## 2020-04-12 DIAGNOSIS — N184 Chronic kidney disease, stage 4 (severe): Secondary | ICD-10-CM

## 2020-04-12 NOTE — Chronic Care Management (AMB) (Signed)
Chronic Care Management Pharmacy  Name: Christian [name]  MRN: [id number] DOB: 09/21/1945   Chief Complaint/ HPI  Christian [name],  74 y.o. , male presents for his Initial CCM visit with the clinical pharmacist In office.  PCP : Hoyt Koch, MD  Patient Care Team: Hoyt Koch, MD as PCP - General (Internal Medicine) Gardiner Barefoot, DPM as Consulting Physician (Podiatry) McKenzie, Candee Furbish, MD as Consulting Physician (Urology) Woody Seller Sylvan Cheese, MD as Referring Physician (Cardiology) Charlton Haws, [facility] as Pharmacist (Pharmacist)  Patient's chronic conditions include: Hypertension, Hyperlipidemia, Diabetes, Coronary Artery Disease, Chronic Kidney Disease and BPH, Multiple sclerosis, Anemia of CKD  Office Visits: 03/25/20 Dr Sharlet Salina OV: chronic f/u, pt recovering from complicated CABG in spring. Following w/ cardiology and nephrology. No med changes.  Consult Visit: 03/05/20 Dr Einar Gip (cardiology): BP 158/96.  off HD since 02/01/20. Increased metoprolol to 50 mg, added isosorbide DN 30 mg TID and hydralazine 25 mg TID.  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   Subjective: Patient lives at home with wife who handles his medications. She sets up pill box for him and keeps track of changes on med list. Patient is currently getting help from home health nurse 3 days a week but this service will end soon per patient. He is interested in getting into [facility] for supervised physical therapy and exercise.  They just received call from nephrology this morning to change furosemide to 80 mg once daily and start Vitamin D 50,000 IU weekly and OTC iron supplement. Creatinine has improve to 2.58.  Objective: No Known Allergies  Medications: Outpatient Encounter Medications as of 04/12/2020  Medication Sig  .  aspirin EC 81 MG tablet Take 81 mg by mouth daily.  Marland Kitchen atorvastatin (LIPITOR) 20 MG tablet TAKE 1 TABLET BY MOUTH EVERY DAY IN THE EVENING (Patient taking differently: Take 20 mg by mouth every evening.)  . clonazePAM (KLONOPIN) 0.5 MG disintegrating tablet TAKE 1 TABLET BY MOUTH TWICE A DAY  . clopidogrel (PLAVIX) 75 MG tablet Take 1 tablet (75 mg total) by mouth daily.  . cromolyn (OPTICROM) 4 % ophthalmic solution SMARTSIG:1 Drop(s) In Eye(s) Every 6-12 Hours PRN  . [facility] ACETAMINOPHEN 325 MG tablet TAKE 2 CAPS BY MOUTH EVERY 6 HOURS AS NEEDED FOR PAIN FOR TEMP 100F OR ABOVE  . furosemide (LASIX) 40 MG tablet Take 1 tablet (40 mg total) by mouth 2 (two) times daily. (Patient taking differently: Take 80 mg by mouth daily.)  . hydrALAZINE (APRESOLINE) 25 MG tablet Take 1 tablet (25 mg total) by mouth 3 (three) times daily.  . isosorbide dinitrate (ISORDIL) 30 MG tablet Take 1 tablet (30 mg total) by mouth 3 (three) times daily.  . metoprolol succinate (TOPROL-XL) 50 MG 24 hr tablet Take 1 tablet (50 mg total) by mouth daily. Take with or immediately following a meal in morning. Hold dialysis days (Patient taking differently: Take 50 mg by mouth daily. Take with or immediately following a meal in morning)  . multivitamin (RENA-VIT) TABS tablet Take 1 tablet by mouth at bedtime.  . nitroGLYCERIN (NITROSTAT) 0.4 MG SL tablet Place 0.4 mg under the tongue as directed.  Marland Kitchen Nystatin (GERHARDT'S BUTT CREAM) CREA Apply 1 application topically 4 (four) times daily as needed for irritation.  Marland Kitchen PREVIDENT 5000 BOOSTER PLUS 1.1 % PSTE Take 1 application by mouth  daily.   . SENNA-PLUS 8.6-50 MG tablet TAKE 2 TABLETS BY MOUTH AT BEDTIME FOR CONSTIPATION (Patient taking differently: Take 1 tablet by mouth at bedtime as needed.)  . sertraline (ZOLOFT) 25 MG tablet TAKE 1 TABLET ONE TIME A DAY  . silodosin (RAPAFLO) 8 MG CAPS capsule Take 8 mg by mouth at bedtime.  . tamsulosin (FLOMAX) 0.4 MG CAPS capsule Take 0.4 mg  by mouth daily after breakfast.   . Trospium Chloride 60 MG CP24 Take 1 capsule by mouth daily.   No facility-administered encounter medications on file as of 04/12/2020.    Wt Readings from Last 3 Encounters:  03/25/20 187 lb 3.2 oz (84.9 kg)  03/05/20 181 lb (82.1 kg)  02/09/20 168 lb (76.2 kg)    Lab Results  Component Value Date   CREATININE 3.18 (H) 03/25/2020   BUN 38 (H) 03/25/2020   GFR 18.69 (L) 03/25/2020   GFRNONAA 16 (L) 10/18/2019   GFRAA 19 (L) 10/18/2019   NA 144 03/25/2020   K 4.3 03/25/2020   CALCIUM 9.3 03/25/2020   CO2 28 03/25/2020     Current Diagnosis/Assessment:  SDOH Interventions   Flowsheet Row Most Recent Value  SDOH Interventions   Financial Strain Interventions Intervention Not Indicated      Goals Addressed            This Visit's Progress   . Pharmacy Care Plan       CARE PLAN ENTRY (see longitudinal plan of care for additional care plan information)  Current Barriers:  . Chronic Disease Management support, education, and care coordination needs related to Hypertension, Hyperlipidemia, Diabetes, Coronary Artery Disease, and Chronic Kidney Disease   Hypertension / CKD BP Readings from Last 3 Encounters:  03/25/20 140/78  03/05/20 (!) 158/96  02/09/20 (!) 145/87 .  Pharmacist Clinical Goal(s): o Over the next 30 days, patient will work with PharmD and providers to achieve BP goal <130/80 . Current regimen:  o Metoprolol succinate 50 mg daily o Furosemide 80 mg once daily o Hydralazine 25 mg 3 times daily o Isosorbide DN 30 mg 3 times daily . Interventions: o Discussed BP goals and benefits of medications for prevention of heart attack / stroke and renal failure o Recommend increasing metoprolol to 50 mg daily due to HR in 90s-100s and BP above goal . Patient self care activities - Over the next 30 days, patient will: o Check BP daily, document, and provide at future appointments o Ensure daily salt intake < 2300  mg/day  Hyperlipidemia / CAD Lab Results  Component Value Date/Time   LDLCALC 40 08/11/2019 04:26 AM   LDLDIRECT 40.0 11/23/2018 11:20 AM .  Pharmacist Clinical Goal(s): o Over the next 30 days, patient will work with PharmD and providers to maintain LDL goal < 70 . Current regimen:  o Atorvastatin 20 mg daily o Clopidogrel 75 mg daily o Aspirin 81 mg daily o Isosorbide DN 30 mg 3 times daily o Nitroglycerin 0.4 mg as needed . Interventions: o Discussed cholesterol goals and benefits of medications for prevention of heart attack / stroke . Patient self care activities - Over the next 30 days, patient will: o Continue current medications  Diabetes Lab Results  Component Value Date/Time   HGBA1C 5.4 03/25/2020 11:55 AM   HGBA1C 4.9 12/20/2019 01:12 PM .  Pharmacist Clinical Goal(s): o Over the next 30 days, patient will work with PharmD and providers to maintain A1c goal <7% . Current regimen:  o No medications .  Interventions: o Discussed importance of maintaining sugars at goal to prevent complications of diabetes including kidney damage, retinal damage, and cardiovascular disease . Patient self care activities - Over the next 30 days, patient will: o Continue to monitor  Medication management . Pharmacist Clinical Goal(s): o Over the next 30 days, patient will work with PharmD and providers to maintain optimal medication adherence . [facility] . Interventions o Comprehensive medication review performed. o Continue current medication management strategy . Patient self care activities - Over the next 30 days, patient will: o Focus on medication adherence by pill box o Take medications as prescribed o Report any questions or concerns to PharmD and/or provider(s)  Initial goal documentation       Hypertension   BP goal is:  <130/80  Office blood pressures are  BP Readings from Last 3 Encounters:  03/25/20 140/78  03/05/20 (!) 158/96  02/09/20 (!)  145/87   Patient checks BP at home 3-5x per week, in the morning Patient home BP readings are ranging: 120/66-150/80. HR 87-102. 11/17 150/80, P 87 11/22 117/62, P 93 11/29 140/78, P 99 11/30 121/75, P 98 12/6   120/66, P 102 12/7   130/72, P 93 12/8   146/84, P 91 Month average: 132/73, P 94  Patient has failed these meds in the past: olmesartan, HCTZ Patient is currently uncontrolled on the following medications:  Marland Kitchen Metoprolol succinate 50 mg  . Furosemide 40 mg - 2 tab AM (reduced today by nephro) . Hydralazine 25 mg TID . Isosorbide DN 30 mg TID  We discussed diet and exercise extensively; importance of limiting salt and fluids;  -pt feels like his heart is racing much of the time, average pulse over the past month is 94. Before he was on dialysis he was taking 200 mg of metoprolol per day, dose was decreased while he was on HD, now he is off HD for ~2 months and BP is up. Per home readings provided by patient, BP should tolerate a higher dose of metoprolol. Will discuss with cardiologist.  Plan  Continue current medications  Reduce salt in diet < 1500 mg/day Recommend to increase metoprolol succinate to 100 mg daily   Hyperlipidemia   LDL goal < 70 Hx CAD. CABG 04/6107 w/ complicated post-op including cardiac arrest and acute renal failure requiring HD.  Last lipids Lab Results  Component Value Date   CHOL 107 08/11/2019   HDL 42 08/11/2019   LDLCALC 40 08/11/2019   LDLDIRECT 40.0 11/23/2018   TRIG 71 08/27/2019   CHOLHDL 2.5 08/11/2019   Hepatic Function Latest Ref Rng & Units 03/25/2020 12/20/2019 10/18/2019  Total Protein 6.1 - 8.1 g/dL - 5.7(L) -  Albumin 3.5 - 5.2 g/dL 4.1 - 2.6(L)  AST 10 - 35 U/L - 21 -  ALT 9 - 46 U/L - 10 -  Alk Phosphatase 38 - 126 U/L - - -  Total Bilirubin 0.2 - 1.2 mg/dL - 0.4 -    The ASCVD Risk score (Jackson., et al., 2013) failed to calculate for the following reasons:   The patient has a prior MI or stroke diagnosis    Patient has failed these meds in past: n/a Patient is currently controlled on the following medications:  . Atorvastatin 20 mg daily . Clopidogrel 75 mg daily . Aspirin 81 mg daily . Isosorbide DN 30 mg TID . Nitroglycerin 0.4 mg SL prn  We discussed:  diet and exercise extensively; Cholesterol goals; benefits of  statin for ASCVD risk reduction; pt's LDL has been < 55 for years, even before NSTEMI/CABG this year. Pt has not had to use NTG.   Plan  Continue current medications  Diabetes   A1c goal <7%  Diet controlled  Recent Relevant Labs: Lab Results  Component Value Date/Time   HGBA1C 5.4 03/25/2020 11:55 AM   HGBA1C 4.9 12/20/2019 01:12 PM   GFR 18.69 (L) 03/25/2020 11:55 AM   GFR 43.64 (L) 08/09/2019 09:22 AM    Last diabetic Eye exam:  Lab Results  Component Value Date/Time   HMDIABEYEEXA No Retinopathy 10/13/2017 12:00 AM    Last diabetic Foot exam: No results found for: HMDIABFOOTEX   Patient has failed these meds in past: Jardiance, metformin  Patient is currently controlled on the following medications: . No medications  We discussed: diet and exercise extensively;  -patient reports he never had diabetes, so discussed previous diabetes diagnosis (A1c was 7.7%  prior to hospitalization in April); currently A1c is much lower, he also has new anemia of CKD which may be causing a falsely low A1c. It will be important to continue to monitor A1c, blood sugars and potentially fructosamine to evaluate control of DM.  Plan  Continue control with diet and exercise  Continue to monitor A1c, glucose and possibly fructosamine given new anemia  BPH   No results found for: PSA   Patient has failed these meds in past: n/a Patient is currently controlled on the following medications:  . Tamsulosin 0.4 mg daily . Silodosin 8 mg HS . Trospium 60 mg daily  We discussed:  Patient is satisfied with current regimen and denies issues. He has a urologist that he is  overdue to see.  Plan  Continue current medications  Depression / anxiety   Depression screen [facility] 2/9 11/23/2018 10/18/2017 03/29/2017  Decreased Interest 0 0 1  Down, Depressed, Hopeless 0 0 0  PHQ - 2 Score 0 0 1   No flowsheet data found.  Patient has failed these meds in past: n/a Patient is currently controlled on the following medications:  . Clonazepam ODT 0.5 mg BID . Sertraline 25 mg daily  We discussed:  Patient is satisfied with current regimen and denies issues  Plan  Continue current medications  Anemia   CBC Latest Ref Rng & Units 10/17/2019 10/14/2019 10/12/2019  WBC 4.0 - 10.5 K/uL 12.3(H) 14.2(H) 14.6(H)  Hemoglobin 13.0 - 17.0 g/dL 9.2(L) 9.2(L) 9.3(L)  Hematocrit 39.0 - 52.0 % 29.1(L) 30.2(L) 30.3(L)  Platelets 150 - 400 K/uL 346 377 369   Iron/TIBC/Ferritin/ %Sat    Component Value Date/Time   IRON 36 (L) 09/26/2019 0345   TIBC 260 09/26/2019 0345   FERRITIN 511 (H) 09/26/2019 0345   IRONPCTSAT 14 (L) 09/26/2019 0345   Patient has failed these meds in past: n/a Patient is currently uncontrolled on the following medications:  . Ferrous sulfate 325 mg (starting today)  We discussed:  Pt spoke to nephrologist office today who advised he start an iron supplement OTC. Discussed he should take 1 daily with food to prevent constipation, he may take with vitamin C to improve absorption.  Plan  Continue current medications  Pain   Patient has failed these meds in past: Goodys, BC powder Patient is currently controlled on the following medications:  . Tylenol 325 mg PRN . Oxycodone 5 mg PRN  We discussed:  Pt uses mainly Tylenol for pain, he has oxycodone remaining and uses this very infrequently, last time was several  weeks ago. Advised to avoid NSAIDs (Goodys) due to bleed risk with DAPT and CKD.  Plan  Continue current medications  Vaccines   Reviewed and discussed patient's vaccination history.    Immunization History  Administered Date(s)  Administered  . Influenza, High Dose Seasonal PF 01/30/2020  . Influenza,inj,Quad PF,6+ Mos 02/28/2014, 02/11/2015  . Influenza-Unspecified 03/11/2017  . PFIZER SARS-COV-2 Vaccination 06/01/2019, 06/22/2019, 01/18/2020  . Pneumococcal Conjugate-13 08/13/2015  . Pneumococcal Polysaccharide-23 02/28/2014  . Tdap 08/13/2015    Plan  Recommended patient receive Shingrix vaccine  Health Maintenance   Patient is currently controlled on the following medications:  . Cromolyn 4% eye drops . Tylenol 325 mg PRN . Rena-Vit . Nystatin cream . Prevident . Senna Plus 1 tab HS prn  We discussed: pt reports he had been having more bowel movements than before he was hospitalized, only recently this has improved; discussed changing medications can cause upset stomach, but the body tends to adjust and improve with time; also discussed he may no longer need Senna Plus daily, may try QOD or PRN dosing and see how he does.  Plan  Continue current medications  May try Senna Plus every other day or PRN  Medication Management   [facility]  [id number] [place] [id number]: [phone]: [phone] Uses pill box? Yes Pt endorses 100% compliance  We discussed: Current pharmacy is preferred with insurance plan and patient is satisfied with pharmacy services  Plan  Continue current medication management strategy    Follow up: 1 month phone visit  LCharlene Brooke PharmD, BCACP Clinical Pharmacist [phone]

## 2020-04-12 NOTE — Patient Instructions (Addendum)
Visit Information  Phone number for Pharmacist: [phone]  Thank you for meeting with me to discuss your medications! I look forward to working with you to achieve your health care goals. Below is a summary of what we talked about during the visit:  Goals Addressed            This Visit's Progress   . Pharmacy Care Plan       CARE PLAN ENTRY (see longitudinal plan of care for additional care plan information)  Current Barriers:  . Chronic Disease Management support, education, and care coordination needs related to Hypertension, Hyperlipidemia, Diabetes, Coronary Artery Disease, and Chronic Kidney Disease   Hypertension / CKD BP Readings from Last 3 Encounters:  03/25/20 140/78  03/05/20 (!) 158/96  02/09/20 (!) 145/87 .  Pharmacist Clinical Goal(s): o Over the next 30 days, patient will work with PharmD and providers to achieve BP goal <130/80 . Current regimen:  o Metoprolol succinate 50 mg daily o Furosemide 80 mg once daily o Hydralazine 25 mg 3 times daily o Isosorbide DN 30 mg 3 times daily . Interventions: o Discussed BP goals and benefits of medications for prevention of heart attack / stroke and renal failure o Recommend increasing metoprolol to 50 mg daily due to HR in 90s-100s and BP above goal . Patient self care activities - Over the next 30 days, patient will: o Check BP daily, document, and provide at future appointments o Ensure daily salt intake < 2300 mg/day  Hyperlipidemia / CAD Lab Results  Component Value Date/Time   LDLCALC 40 08/11/2019 04:26 AM   LDLDIRECT 40.0 11/23/2018 11:20 AM .  Pharmacist Clinical Goal(s): o Over the next 30 days, patient will work with PharmD and providers to maintain LDL goal < 70 . Current regimen:  o Atorvastatin 20 mg daily o Clopidogrel 75 mg daily o Aspirin 81 mg daily o Isosorbide DN 30 mg 3 times daily o Nitroglycerin 0.4 mg as needed . Interventions: o Discussed cholesterol goals and benefits of  medications for prevention of heart attack / stroke . Patient self care activities - Over the next 30 days, patient will: o Continue current medications  Diabetes Lab Results  Component Value Date/Time   HGBA1C 5.4 03/25/2020 11:55 AM   HGBA1C 4.9 12/20/2019 01:12 PM .  Pharmacist Clinical Goal(s): o Over the next 30 days, patient will work with PharmD and providers to maintain A1c goal <7% . Current regimen:  o No medications . Interventions: o Discussed importance of maintaining sugars at goal to prevent complications of diabetes including kidney damage, retinal damage, and cardiovascular disease . Patient self care activities - Over the next 30 days, patient will: o Continue to monitor  Medication management . Pharmacist Clinical Goal(s): o Over the next 30 days, patient will work with PharmD and providers to maintain optimal medication adherence . Current pharmacy: [facility] . Interventions o Comprehensive medication review performed. o Continue current medication management strategy . Patient self care activities - Over the next 30 days, patient will: o Focus on medication adherence by pill box o Take medications as prescribed o Report any questions or concerns to PharmD and/or provider(s)  Initial goal documentation      Christian [name] was given information about Chronic Care Management services today including:  1. CCM service includes personalized support from designated clinical staff supervised by his physician, including individualized plan of care and coordination with other care providers 2. 24/7 contact phone numbers for assistance for urgent and routine  care needs. 3. Standard insurance, coinsurance, copays and deductibles apply for chronic care management only during months in which we provide at least 20 minutes of these services. Most insurances cover these services at 100%, however patients may be responsible for any copay, coinsurance and/or deductible if applicable.  This service may help you avoid the need for more expensive face-to-face services. 4. Only one practitioner may furnish and bill the service in a calendar month. 5. The patient may stop CCM services at any time (effective at the end of the month) by phone call to the office staff.  Patient agreed to services and verbal consent obtained.   The patient verbalized understanding of instructions, educational materials, and care plan provided today and agreed to receive a mailed copy of patient instructions, educational materials, and care plan.  Telephone follow up appointment with pharmacy team member scheduled for: 1 month  Charlene Brooke, PharmD, BCACP Clinical Pharmacist [facility] [phone]  [place] stands for "Dietary Approaches to Stop Hypertension." The DASH eating plan is a healthy eating plan that has been shown to reduce high blood pressure (hypertension). It may also reduce your risk for type 2 diabetes, heart disease, and stroke. The DASH eating plan may also help with weight loss. What are tips for following this plan?  General guidelines  Avoid eating more than 2,300 mg (milligrams) of salt (sodium) a day. If you have hypertension, you may need to reduce your sodium intake to 1,500 mg a day.  Limit alcohol intake to no more than 1 drink a day for nonpregnant women and 2 drinks a day for men. One drink equals 12 oz of beer, 5 oz of wine, or 1 oz of hard liquor.  Work with your health care provider to maintain a healthy body weight or to lose weight. Ask what an ideal weight is for you.  Get at least 30 minutes of exercise that causes your heart to beat faster (aerobic exercise) most days of the week. Activities may include walking, swimming, or biking.  Work with your health care provider or diet and nutrition specialist (dietitian) to adjust your eating plan to your individual calorie needs. Reading food labels   Check food  labels for the amount of sodium per serving. Choose foods with less than 5 percent of the Daily Value of sodium. Generally, foods with less than 300 mg of sodium per serving fit into this eating plan.  To find whole grains, look for the word "whole" as the first word in the ingredient list. Shopping  Buy products labeled as "low-sodium" or "no salt added."  Buy fresh foods. Avoid canned foods and premade or frozen meals. Cooking  Avoid adding salt when cooking. Use salt-free seasonings or herbs instead of table salt or sea salt. Check with your health care provider or pharmacist before using salt substitutes.  Do not fry foods. Cook foods using healthy methods such as baking, boiling, grilling, and broiling instead.  Cook with heart-healthy oils, such as olive, canola, soybean, or sunflower oil. Meal planning  Eat a balanced diet that includes: ? 5 or more servings of fruits and vegetables each day. At each meal, try to fill half of your plate with fruits and vegetables. ? Up to 6-8 servings of whole grains each day. ? Less than 6 oz of lean meat, poultry, or fish each day. A 3-oz serving of meat is about the same size as a deck of cards. One egg equals  1 oz. ? 2 servings of low-fat dairy each day. ? A serving of nuts, seeds, or beans 5 times each week. ? Heart-healthy fats. Healthy fats called Omega-3 fatty acids are found in foods such as flaxseeds and coldwater fish, like sardines, salmon, and mackerel.  Limit how much you eat of the following: ? Canned or prepackaged foods. ? Food that is high in trans fat, such as fried foods. ? Food that is high in saturated fat, such as fatty meat. ? Sweets, desserts, sugary drinks, and other foods with added sugar. ? Full-fat dairy products.  Do not salt foods before eating.  Try to eat at least 2 vegetarian meals each week.  Eat more home-cooked food and less restaurant, buffet, and fast food.  When eating at a restaurant, ask that your  food be prepared with less salt or no salt, if possible. What foods are recommended? The items listed may not be a complete list. Talk with your dietitian about what dietary choices are best for you. Grains Whole-grain or whole-wheat bread. Whole-grain or whole-wheat pasta. Brown rice. [place]. Bulgur. Whole-grain and low-sodium cereals. Pita bread. Low-fat, low-sodium crackers. Whole-wheat flour tortillas. Vegetables Fresh or frozen vegetables (raw, steamed, roasted, or grilled). Low-sodium or reduced-sodium tomato and vegetable juice. Low-sodium or reduced-sodium tomato sauce and tomato paste. Low-sodium or reduced-sodium canned vegetables. Fruits All fresh, dried, or frozen fruit. Canned fruit in natural juice (without added sugar). Meat and other protein foods Skinless chicken or [place]. Ground chicken or [place]. Pork with fat trimmed off. Fish and seafood. Egg whites. Dried beans, peas, or lentils. Unsalted nuts, nut butters, and seeds. Unsalted canned beans. Lean cuts of beef with fat trimmed off. Low-sodium, lean deli meat. Dairy Low-fat (1%) or fat-free (skim) milk. Fat-free, low-fat, or reduced-fat cheeses. Nonfat, low-sodium ricotta or cottage cheese. Low-fat or nonfat yogurt. Low-fat, low-sodium cheese. Fats and oils Soft margarine without trans fats. Vegetable oil. Low-fat, reduced-fat, or light mayonnaise and salad dressings (reduced-sodium). Canola, safflower, olive, soybean, and sunflower oils. Avocado. Seasoning and other foods Herbs. Spices. Seasoning mixes without salt. Unsalted popcorn and pretzels. Fat-free sweets. What foods are not recommended? The items listed may not be a complete list. Talk with your dietitian about what dietary choices are best for you. Grains Baked goods made with fat, such as croissants, muffins, or some breads. Dry pasta or rice meal packs. Vegetables Creamed or fried vegetables. Vegetables in a cheese sauce. Regular canned vegetables (not  low-sodium or reduced-sodium). Regular canned tomato sauce and paste (not low-sodium or reduced-sodium). Regular tomato and vegetable juice (not low-sodium or reduced-sodium). Angie Fava. Olives. Fruits Canned fruit in a light or heavy syrup. Fried fruit. Fruit in cream or butter sauce. Meat and other protein foods Fatty cuts of meat. Ribs. Fried meat. Berniece Salines. Sausage. Bologna and other processed lunch meats. Salami. Fatback. Hotdogs. Bratwurst. Salted nuts and seeds. Canned beans with added salt. Canned or smoked fish. Whole eggs or egg yolks. Chicken or [place] with skin. Dairy Whole or 2% milk, cream, and half-and-half. Whole or full-fat cream cheese. Whole-fat or sweetened yogurt. Full-fat cheese. Nondairy creamers. Whipped toppings. Processed cheese and cheese spreads. Fats and oils Butter. Stick margarine. Lard. Shortening. Ghee. Bacon fat. Tropical oils, such as coconut, palm kernel, or palm oil. Seasoning and other foods Salted popcorn and pretzels. Onion salt, garlic salt, seasoned salt, table salt, and sea salt. Worcestershire sauce. Tartar sauce. Barbecue sauce. Teriyaki sauce. Soy sauce, including reduced-sodium. Steak sauce. Canned and packaged gravies. Fish sauce. [employer]  sauce. Cocktail sauce. Horseradish that you find on the shelf. Ketchup. Mustard. Meat flavorings and tenderizers. Bouillon cubes. Hot sauce and Tabasco sauce. Premade or packaged marinades. Premade or packaged taco seasonings. Relishes. Regular salad dressings. Where to find more information:  National Heart, Lung, and [place]: https://wilson-eaton.com/  American Heart Association: www.heart.org Summary  The DASH eating plan is a healthy eating plan that has been shown to reduce high blood pressure (hypertension). It may also reduce your risk for type 2 diabetes, heart disease, and stroke.  With the DASH eating plan, you should limit salt (sodium) intake to 2,300 mg a day. If you have hypertension, you may need to reduce  your sodium intake to 1,500 mg a day.  When on the DASH eating plan, aim to eat more fresh fruits and vegetables, whole grains, lean proteins, low-fat dairy, and heart-healthy fats.  Work with your health care provider or diet and nutrition specialist (dietitian) to adjust your eating plan to your individual calorie needs. This information is not intended to replace advice given to you by your health care provider. Make sure you discuss any questions you have with your health care provider. Document Revised: 03/26/2017 Document Reviewed: 04/06/2016 Elsevier Patient Education  2020 [employer].

## 2020-04-12 NOTE — Addendum Note (Signed)
Addended by: Cresenciano Lick on: 04/12/2020 12:12 PM   Modules accepted: Orders

## 2020-04-13 DIAGNOSIS — N186 End stage renal disease: Secondary | ICD-10-CM | POA: Diagnosis not present

## 2020-04-13 DIAGNOSIS — Z992 Dependence on renal dialysis: Secondary | ICD-10-CM | POA: Diagnosis not present

## 2020-04-13 DIAGNOSIS — R262 Difficulty in walking, not elsewhere classified: Secondary | ICD-10-CM | POA: Diagnosis not present

## 2020-04-13 DIAGNOSIS — I214 Non-ST elevation (NSTEMI) myocardial infarction: Secondary | ICD-10-CM | POA: Diagnosis not present

## 2020-04-15 NOTE — Progress Notes (Signed)
Received message from Dr Einar Gip to go ahead and increase metoprolol to 100 mg. Contacted patient to inform him, he voiced understanding to start taking 100 mg and follow up with Dr Einar Gip in January.

## 2020-04-17 DIAGNOSIS — Z951 Presence of aortocoronary bypass graft: Secondary | ICD-10-CM | POA: Diagnosis not present

## 2020-04-17 DIAGNOSIS — I509 Heart failure, unspecified: Secondary | ICD-10-CM | POA: Diagnosis not present

## 2020-04-17 DIAGNOSIS — Z931 Gastrostomy status: Secondary | ICD-10-CM | POA: Diagnosis not present

## 2020-04-22 DIAGNOSIS — N185 Chronic kidney disease, stage 5: Secondary | ICD-10-CM | POA: Diagnosis not present

## 2020-04-24 ENCOUNTER — Ambulatory Visit (INDEPENDENT_AMBULATORY_CARE_PROVIDER_SITE_OTHER): Payer: Medicare Other | Admitting: Podiatry

## 2020-04-24 ENCOUNTER — Other Ambulatory Visit: Payer: Self-pay

## 2020-04-24 ENCOUNTER — Encounter: Payer: Self-pay | Admitting: Podiatry

## 2020-04-24 DIAGNOSIS — D500 Iron deficiency anemia secondary to blood loss (chronic): Secondary | ICD-10-CM | POA: Insufficient documentation

## 2020-04-24 DIAGNOSIS — M79674 Pain in right toe(s): Secondary | ICD-10-CM

## 2020-04-24 DIAGNOSIS — D62 Acute posthemorrhagic anemia: Secondary | ICD-10-CM | POA: Insufficient documentation

## 2020-04-24 DIAGNOSIS — B351 Tinea unguium: Secondary | ICD-10-CM | POA: Diagnosis not present

## 2020-04-24 DIAGNOSIS — M79675 Pain in left toe(s): Secondary | ICD-10-CM

## 2020-04-24 DIAGNOSIS — E1142 Type 2 diabetes mellitus with diabetic polyneuropathy: Secondary | ICD-10-CM

## 2020-04-26 NOTE — Progress Notes (Signed)
Subjective:  Patient ID: Christian [name], male    DOB: [birth year],  MRN: [id number]  74 y.o. male presents with at risk foot care with history of diabetic neuropathy.    Patient did not check blood glucose this morning and states he doesn't check it daily. His wife is present during today's visit. She is requesting medication list be updated.  PCP: Hoyt Koch, MD and last visit was: 03/25/2020.  Review of Systems: Negative except as noted in the HPI.  Past Medical History:  Diagnosis Date  . BPH (benign prostatic hypertrophy)   . Coronary artery disease   . Diabetes mellitus without complication ([place])   . Dry eyes left  . Hiatal hernia   . 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  LIMA TO LAD, RIMA TO PDA, RADIAL ARTERY TO CIRC AND SEQUENTIALLY TO OM1. 08/15/2019  . Hyperlipidemia   . Hypertension   . Incomplete bladder emptying   . Nocturia   . 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 SOB (shortness of breath) on exertion    Past Surgical History:  Procedure Laterality Date  . APPLICATION OF WOUND VAC N/A 08/24/2019   Procedure: APPLICATION OF WOUND VAC;  Surgeon: [name], MD;  Location: [place];  Service: Thoracic;  Laterality: N/A;  . APPLICATION OF WOUND VAC  08/29/2019   Procedure: Wound Vac change;  Surgeon: [name], MD;  Location: [place] [facility];  Service: Open Heart Surgery;;  . APPLICATION OF WOUND VAC N/A 09/04/2019   Procedure: Application Of Wound Vac;  Surgeon: Grace Geneva, MD;  Location: [place];  Service: Open Heart Surgery;  Laterality: N/A;  . APPLICATION OF WOUND VAC N/A 09/06/2019   Procedure: APPLICATION OF ACELL, APPLICATION OF WOUND VAC USING PREVENA INCISIONAL  DRESSING;  Surgeon: Wallace Going, DO;  Location: [place];  Service: Plastics;  Laterality: N/A;  . CARDIAC CATHETERIZATION    . CORONARY  ARTERY BYPASS GRAFT N/A 08/15/2019   Procedure: CORONARY ARTERY BYPASS GRAFTING (CABG), x 4 using bilateral IMAs and left radial artery .  LIMA TO LAD, RIMA TO PDA, RADIAL ARTERY TO CIRC AND SEQUENTIALLY TO OM1.;  Surgeon: [name], MD;  Location: [place];  Service: Open Heart Surgery;  Laterality: N/A;  . CORONARY STENT PLACEMENT  02/27/2014   distal rt/pd coronary       dr Einar Gip  . CYSTO/ BLADDER BIOPSY'S/ CAUTHERIZATION  01-14-2004  DR Gaynelle Arabian  . EXPLORATION POST OPERATIVE OPEN HEART N/A 08/16/2019   Procedure: Chest Closure S?P CABG WITH APPLICATION OF PREVENA  INCISIONAL WOUND VAC;  Surgeon: [name], MD;  Location: [facility];  Service: Open Heart Surgery;  Laterality: N/A;  . EXPLORATION POST OPERATIVE OPEN HEART N/A 08/21/2019   Procedure: CHEST WASHOUT S/P OPEN CHEST;  Surgeon: [name], MD;  Location: [place];  Service: Open Heart Surgery;  Laterality: N/A;  Open chest with Esmark dressing with Ioban sealant coverage.  . EXPLORATION POST OPERATIVE OPEN HEART N/A 08/18/2019   Procedure: EXPLORATION POST OPERATIVE OPEN HEART (performed 04/23 on unit);  Surgeon: [name], MD;  Location: [place];  Service: Open Heart Surgery;  Laterality: N/A;  . EXPLORATION POST OPERATIVE OPEN HEART N/A 08/24/2019   Procedure: CHEST WASHOUT POST OPERATIVE OPEN HEART;  Surgeon: [name], MD;  Location: [place];  Service: Open Heart Surgery;  Laterality: N/A;  . EXPLORATION POST OPERATIVE OPEN HEART N/A 08/29/2019   Procedure: CHEST WOUND WASHOUT POST OPERATIVE OPEN HEART;  Surgeon: [name], MD;  Location: [place];  Service: Open Heart Surgery;  Laterality: N/A;  . EXPLORATION POST OPERATIVE OPEN HEART N/A 09/04/2019   Procedure: MEDIASTINAL EXPLORATION WITH STERNAL WOUND IRRIGATION;  Surgeon: Grace Xaden, MD;  Location: [place];  Service: Open Heart Surgery;  Laterality: N/A;  . EXPLORATION POST OPERATIVE OPEN HEART N/A 09/14/2019   Procedure: EVACUATION OF HEMATOMA;   Surgeon: Grace Jasher, MD;  Location: [place];  Service: Open Heart Surgery;  Laterality: N/A;  . IR FLUORO GUIDE CV LINE LEFT  09/19/2019  . IR GASTROSTOMY TUBE MOD SED  10/04/2019  . IR GASTROSTOMY TUBE REMOVAL  11/29/2019  . IR PATIENT EVAL TECH 0-60 MINS  09/29/2019  . IR US GUIDE VASC ACCESS LEFT  09/19/2019  . LAPAROSCOPIC LYSIS OF ADHESIONS N/A 09/06/2019   Procedure: LAPAROSCOPIC OMENTAL HARVEST;  Surgeon: Kinsinger, Arta Bruce, MD;  Location: [place];  Service: General;  Laterality: N/A;  . LEFT HEART CATH AND CORONARY ANGIOGRAPHY N/A 08/10/2019   Procedure: LEFT HEART CATH AND CORONARY ANGIOGRAPHY;  Surgeon: Nigel Mormon, MD;  Location: [place] [facility];  Service: Cardiovascular;  Laterality: N/A;  . LEFT HEART CATHETERIZATION WITH CORONARY ANGIOGRAM N/A 02/27/2014   Procedure: LEFT HEART CATHETERIZATION WITH CORONARY ANGIOGRAM;  Surgeon: Laverda Page, MD;  Location[facility] CATH LAB;  Service: Cardiovascular;  Laterality: N/A;  . MEDIASTINAL EXPLORATION N/A 09/06/2019   Procedure: MEDIASTINAL EXPLORATION;  Surgeon: Grace Taysean, MD;  Location: [place];  Service: Thoracic;  Laterality: N/A;  . PECTORALIS FLAP  09/06/2019   Procedure: Pectoralis ADVANCEMENT Flap;  Surgeon: Wallace Going, DO;  Location: [place];  Service: Plastics;;  . PERCUTANEOUS CORONARY STENT INTERVENTION (PCI-S)  02/27/2014   Procedure: PERCUTANEOUS CORONARY STENT INTERVENTION (PCI-S);  Surgeon: Laverda Page, MD;  Location: [facility] CATH LAB;  Service: Cardiovascular;;  rt PDA  3.0/28mm Promus stent  . RADIAL ARTERY HARVEST Left 08/15/2019   Procedure: Radial Artery Harvest;  Surgeon: [name], MD;  Location: [place];  Service: Open Heart Surgery;  Laterality: Left;  . RIB PLATING N/A 09/06/2019   Procedure: STERNAL PLATING;  Surgeon: Grace Macon, MD;  Location: [place];  Service: Thoracic;  Laterality: N/A;  . TEE WITHOUT CARDIOVERSION N/A 08/15/2019   Procedure: TRANSESOPHAGEAL ECHOCARDIOGRAM  (TEE);  Surgeon: [name], MD;  Location: [place];  Service: Open Heart Surgery;  Laterality: N/A;  . TRACHEOSTOMY TUBE PLACEMENT  08/29/2019   Procedure: Tracheostomy;  Surgeon: [name], MD;  Location: [place] [facility];  Service: Open Heart Surgery;;  . TRANSURETHRAL RESECTION OF PROSTATE  04/04/2012   Procedure: TRANSURETHRAL RESECTION OF THE PROSTATE WITH GYRUS INSTRUMENTS;  Surgeon: Ailene Rud, MD;  Location: [place];  Service: Urology;  Laterality: N/A;  . TRANSURETHRAL RESECTION OF PROSTATE N/A 09/27/2014   Procedure: TRANSURETHRAL RESECTION OF THE PROSTATE ;  Surgeon: Carolan Clines, MD;  Location: [facility];  Service: Urology;  Laterality: N/A;  . UPPER GASTROINTESTINAL ENDOSCOPY     Patient Active Problem List   Diagnosis Date Noted  . Anemia due to blood loss 04/24/2020  . CKD (chronic kidney disease) stage 4, GFR 15-29 ml/min (HCC) 03/27/2020  . Aortic atherosclerosis ([place]) 03/27/2020  . 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  . Pruritus, unspecified 11/28/2019  . Unspecified  protein-calorie malnutrition ([place]) 10/27/2019  . Secondary hyperparathyroidism of renal origin ([place]) 10/17/2019  . Coronary artery disease 08/15/2019  . 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  LIMA TO LAD, RIMA TO PDA, RADIAL ARTERY TO CIRC AND SEQUENTIALLY TO OM1. 08/15/2019  . Ischemic cardiomyopathy   . Swelling of lower extremity   . History of coronary artery stent placement   . Pulmonary embolism ([place]) 08/09/2019  . Elevated liver enzymes 08/09/2019  . Left leg weakness 11/23/2018  . CAD (coronary artery disease) 07/30/2018  . Routine general medical examination at a health care facility 08/30/2016  . Diabetes mellitus with polyneuropathy ([place]) 10/10/2014  . Benign prostatic hyperplasia 09/27/2014  . Stable angina pectoris ([place]) 02/27/2014  . S/P PTCA (percutaneous transluminal coronary angioplasty) 02/27/2014  . Hyperlipidemia  associated with type 2 diabetes mellitus ([place]) 11/12/2009  . MULTIPLE SCLEROSIS 11/12/2009  . HTN (hypertension) 11/12/2009    Current Outpatient Medications:  .  aspirin EC 81 MG tablet, Take 81 mg by mouth daily., Disp: , Rfl:  .  atorvastatin (LIPITOR) 20 MG tablet, TAKE 1 TABLET BY MOUTH EVERY DAY IN THE EVENING (Patient taking differently: Take 20 mg by mouth every evening.), Disp: 90 tablet, Rfl: 3 .  clonazePAM (KLONOPIN) 0.5 MG disintegrating tablet, TAKE 1 TABLET BY MOUTH TWICE A DAY, Disp: 60 tablet, Rfl: 5 .  clopidogrel (PLAVIX) 75 MG tablet, Take 1 tablet (75 mg total) by mouth daily., Disp: 90 tablet, Rfl: 3 .  cromolyn (OPTICROM) 4 % ophthalmic solution, SMARTSIG:1 Drop(s) In Eye(s) Every 6-12 Hours PRN, Disp: , Rfl:  .  [employer] ACETAMINOPHEN 325 MG tablet, TAKE 2 CAPS BY MOUTH EVERY 6 HOURS AS NEEDED FOR PAIN FOR TEMP 100F OR ABOVE, Disp: 60 tablet, Rfl: 0 .  ferrous sulfate 325 (65 FE) MG tablet, Take 325 mg by mouth daily with breakfast., Disp: , Rfl:  .  furosemide (LASIX) 40 MG tablet, Take 1 tablet (40 mg total) by mouth 2 (two) times daily. (Patient taking differently: Take 80 mg by mouth daily.), Disp: 90 tablet, Rfl: 1 .  hydrALAZINE (APRESOLINE) 25 MG tablet, Take 1 tablet (25 mg total) by mouth 3 (three) times daily., Disp: 90 tablet, Rfl: 2 .  isosorbide dinitrate (ISORDIL) 30 MG tablet, Take 1 tablet (30 mg total) by mouth 3 (three) times daily., Disp: 90 tablet, Rfl: 2 .  metoprolol succinate (TOPROL-XL) 50 MG 24 hr tablet, Take 1 tablet (50 mg total) by mouth daily. Take with or immediately following a meal in morning. Hold dialysis days (Patient taking differently: Take 50 mg by mouth daily. Take with or immediately following a meal in morning), Disp: 30 tablet, Rfl: 2 .  multivitamin (RENA-VIT) TABS tablet, Take 1 tablet by mouth at bedtime., Disp: , Rfl: 0 .  nitroGLYCERIN (NITROSTAT) 0.4 MG SL tablet, Place 0.4 mg under the tongue as directed., Disp: , Rfl:  .   Nystatin (GERHARDT'S BUTT CREAM) CREA, Apply 1 application topically 4 (four) times daily as needed for irritation., Disp: , Rfl:  .  oxycodone (OXY-IR) 5 MG capsule, Take 5 mg by mouth every 4 (four) hours as needed for pain., Disp: , Rfl:  .  PREVIDENT 5000 BOOSTER PLUS 1.1 % PSTE, Take 1 application by mouth daily. , Disp: , Rfl:  .  SENNA-PLUS 8.6-50 MG tablet, TAKE 2 TABLETS BY MOUTH AT BEDTIME FOR CONSTIPATION (Patient taking differently: Take 1 tablet by mouth at bedtime as needed.), Disp: 60 tablet,  Rfl: 1 .  sevelamer carbonate (RENVELA) 800 MG tablet, Take 1,600 mg by mouth 3 (three) times daily., Disp: , Rfl:  .  silodosin (RAPAFLO) 8 MG CAPS capsule, Take 8 mg by mouth at bedtime., Disp: , Rfl:  .  tamsulosin (FLOMAX) 0.4 MG CAPS capsule, Take 0.4 mg by mouth daily after breakfast. , Disp: , Rfl:  .  Trospium Chloride 60 MG CP24, Take 1 capsule by mouth daily., Disp: , Rfl:  .  Vitamin D, Ergocalciferol, (DRISDOL) 1.25 MG (50000 UNIT) CAPS capsule, Take 50,000 Units by mouth every 7 (seven) days., Disp: , Rfl:  .  sertraline (ZOLOFT) 25 MG tablet, TAKE 1 TABLET ONE TIME A DAY (Patient not taking: Reported on 04/24/2020), Disp: 90 tablet, Rfl: 1 No Known Allergies Social History   Tobacco Use  Smoking Status Former Smoker  . Packs/day: 0.50  . Years: 20.00  . Pack years: 10.00  . Types: Cigarettes  . Quit date: 74  . Years since quitting: 47.0  Smokeless Tobacco Never Used    Objective:  There were no vitals filed for this visit. Constitutional Patient is a pleasant 74 y.o. African American male in NAD. AAO x 3.  Vascular Capillary refill time to digits immediate b/l. Palpable PT pulse(s) b/l lower extremities Faintly palpable DP pulse(s) b/l lower extremities. Pedal hair sparse. Lower extremity skin temperature gradient within normal limits. Trace edema noted b/l lower extremities. No ischemia or gangrene noted b/l lower extremities. No cyanosis or clubbing noted.   Neurologic Normal speech. Protective sensation diminished with 10g monofilament b/l.  Dermatologic Pedal skin with normal turgor, texture and tone bilaterally. No open wounds bilaterally. No interdigital macerations bilaterally. Toenails 2-5 bilaterally and R hallux elongated, discolored, dystrophic, thickened, and crumbly with subungual debris and tenderness to dorsal palpation. Anonychia noted L hallux. Nailbed(s) epithelialized.   Orthopedic: Normal muscle strength 5/5 to all lower extremity muscle groups bilaterally. No pain crepitus or joint limitation noted with ROM b/l. No gross bony deformities bilaterally. Utilizes rollator for ambulation assistance.   Hemoglobin A1C Latest Ref Rng & Units 03/25/2020 12/20/2019 08/14/2019 07/24/2019  HGBA1C 4.6 - 6.5 % 5.4 4.9 7.7(H) 7.7(H)  Some recent data might be hidden       Assessment:   1. Pain due to onychomycosis of toenails of both feet   2. Diabetic polyneuropathy associated with type 2 diabetes mellitus ([place])    Plan:  Patient was evaluated and treated and all questions answered.  Onychomycosis with pain -Nails palliatively debridement as below. -Educated on self-care  Procedure: Nail Debridement Rationale: Pain Type of Debridement: manual, sharp debridement. Instrumentation: Nail nipper, rotary burr. Number of Nails: 9  -Examined patient. -Continue diabetic foot care principles. -Patient to continue soft, supportive shoe gear daily. -Toenails 2-5 bilaterally and R hallux debrided in length and girth without iatrogenic bleeding with sterile nail nipper and dremel.  -Patient to report any pedal injuries to medical professional immediately. -Patient/POA to call should there be question/concern in the interim.  Return in about 3 months (around 07/23/2020) for NAIL TRIM.  Marzetta Board, DPM

## 2020-04-29 ENCOUNTER — Ambulatory Visit: Payer: Federal, State, Local not specified - PPO | Admitting: Cardiology

## 2020-05-02 ENCOUNTER — Encounter: Payer: Self-pay | Admitting: Cardiology

## 2020-05-02 ENCOUNTER — Ambulatory Visit: Payer: Federal, State, Local not specified - PPO | Admitting: Cardiology

## 2020-05-02 ENCOUNTER — Other Ambulatory Visit: Payer: Self-pay

## 2020-05-02 VITALS — BP 122/80 | HR 80 | Resp 16 | Ht 69.0 in | Wt 207.0 lb

## 2020-05-02 DIAGNOSIS — N186 End stage renal disease: Secondary | ICD-10-CM | POA: Diagnosis not present

## 2020-05-02 DIAGNOSIS — Z951 Presence of aortocoronary bypass graft: Secondary | ICD-10-CM | POA: Diagnosis not present

## 2020-05-02 DIAGNOSIS — I25118 Atherosclerotic heart disease of native coronary artery with other forms of angina pectoris: Secondary | ICD-10-CM

## 2020-05-02 DIAGNOSIS — I119 Hypertensive heart disease without heart failure: Secondary | ICD-10-CM | POA: Diagnosis not present

## 2020-05-02 MED ORDER — HYDRALAZINE HCL 25 MG PO TABS
25.0000 mg | ORAL_TABLET | Freq: Three times a day (TID) | ORAL | 3 refills | Status: DC
Start: 2020-05-02 — End: 2020-10-14

## 2020-05-02 NOTE — Progress Notes (Signed)
Primary Physician/Referring:  Hoyt Koch, MD  Patient ID: Christian [name], male    DOB: Mar 06, 1946, 75 y.o.   MRN: [id number]  Chief Complaint  Patient presents with  . Coronary Artery Disease  . Hypertension  . Follow-up    6 weeks    HPI:    Christian [name]  is a 75 y.o. AA male with hypertension, hyperlipidemia, DM with stage 3 CKD,  CAD with  stenting to the PDA on 02/27/2014. Patient presented with unstable angina and NSTEMI on 08/09/2019 along with a very small subsegmental pulmonary embolism and discharged on 10/18/2019 after he underwent CABG x4 on 08/15/2019.  Coronary angiography revealing complex ostial LAD stenosis with TIMI II flow and a calcific 75% ostial circumflex stenosis with reduced LVEF.   He had extremely complex postop complications including cardiogenic shock, cardiac arrest, needing internal CPR in the ICU after opening the chest, A. Fib needing cardioversion, closure office chest wall, developed acute renal failure leading to need for permanent dialysis, right brain infarct with left hemiparesis, multiple blood transfusions.   He is now off of Dialysis since 02/01/2020.  I seen him 2 months ago and as he was feeling well he had started to gain weight.  His blood pressure was also elevated.  He now presents for follow-up, states that he is doing well.  He has noticed occasional episodes of chest tightness in the left upper part of the chest.  He also gets sharp pain that last a few seconds as well.  No dyspnea, no PND or orthopnea.  Still has gait instability and presently undergoing PT at home.     Past Medical History:  Diagnosis Date  . BPH (benign prostatic hypertrophy)   . Coronary artery disease   . Diabetes mellitus without complication ([place])   . Dry eyes left  . Hiatal hernia   . 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  LIMA TO LAD, RIMA TO PDA, RADIAL ARTERY TO CIRC AND SEQUENTIALLY TO OM1. 08/15/2019  .  Hyperlipidemia   . Hypertension   . Incomplete bladder emptying   . Nocturia   . 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 SOB (shortness of breath) on exertion    Past Surgical History:  Procedure Laterality Date  . APPLICATION OF WOUND VAC N/A 08/24/2019   Procedure: APPLICATION OF WOUND VAC;  Surgeon: [name], MD;  Location: [facility];  Service: Thoracic;  Laterality: N/A;  . APPLICATION OF WOUND VAC  08/29/2019   Procedure: Wound Vac change;  Surgeon: [name], MD;  Location: [place] [facility];  Service: Open Heart Surgery;;  . APPLICATION OF WOUND VAC N/A 09/04/2019   Procedure: Application Of Wound Vac;  Surgeon: Grace Archibald, MD;  Location: [place];  Service: Open Heart Surgery;  Laterality: N/A;  . APPLICATION OF WOUND VAC N/A 09/06/2019   Procedure: APPLICATION OF ACELL, APPLICATION OF WOUND VAC USING PREVENA INCISIONAL  DRESSING;  Surgeon: Wallace Going, DO;  Location: [place];  Service: Plastics;  Laterality: N/A;  . CARDIAC CATHETERIZATION    . CORONARY ARTERY BYPASS GRAFT N/A 08/15/2019   Procedure: CORONARY ARTERY BYPASS GRAFTING (CABG), x 4 using bilateral IMAs and left radial artery .  LIMA TO LAD, RIMA TO PDA, RADIAL ARTERY TO CIRC AND SEQUENTIALLY TO OM1.;  Surgeon: [name],  MD;  Location: [facility];  Service: Open Heart Surgery;  Laterality: N/A;  . CORONARY STENT PLACEMENT  02/27/2014   distal rt/pd coronary       dr Einar Gip  . CYSTO/ BLADDER BIOPSY'S/ CAUTHERIZATION  01-14-2004  DR Gaynelle Arabian  . EXPLORATION POST OPERATIVE OPEN HEART N/A 08/16/2019   Procedure: Chest Closure S?P CABG WITH APPLICATION OF PREVENA  INCISIONAL WOUND VAC;  Surgeon: [name], MD;  Location: [facility];  Service: Open Heart Surgery;  Laterality: N/A;  . EXPLORATION POST OPERATIVE OPEN HEART N/A 08/21/2019   Procedure: CHEST WASHOUT S/P OPEN CHEST;  Surgeon: [name], MD;   Location: [place];  Service: Open Heart Surgery;  Laterality: N/A;  Open chest with Esmark dressing with Ioban sealant coverage.  . EXPLORATION POST OPERATIVE OPEN HEART N/A 08/18/2019   Procedure: EXPLORATION POST OPERATIVE OPEN HEART (performed 04/23 on unit);  Surgeon: [name], MD;  Location: [place];  Service: Open Heart Surgery;  Laterality: N/A;  . EXPLORATION POST OPERATIVE OPEN HEART N/A 08/24/2019   Procedure: CHEST WASHOUT POST OPERATIVE OPEN HEART;  Surgeon: [name], MD;  Location: [place];  Service: Open Heart Surgery;  Laterality: N/A;  . EXPLORATION POST OPERATIVE OPEN HEART N/A 08/29/2019   Procedure: CHEST WOUND WASHOUT POST OPERATIVE OPEN HEART;  Surgeon: [name], MD;  Location: [place];  Service: Open Heart Surgery;  Laterality: N/A;  . EXPLORATION POST OPERATIVE OPEN HEART N/A 09/04/2019   Procedure: MEDIASTINAL EXPLORATION WITH STERNAL WOUND IRRIGATION;  Surgeon: Grace Deke, MD;  Location: [place];  Service: Open Heart Surgery;  Laterality: N/A;  . EXPLORATION POST OPERATIVE OPEN HEART N/A 09/14/2019   Procedure: EVACUATION OF HEMATOMA;  Surgeon: Grace Shahin, MD;  Location: [place];  Service: Open Heart Surgery;  Laterality: N/A;  . IR FLUORO GUIDE CV LINE LEFT  09/19/2019  . IR GASTROSTOMY TUBE MOD SED  10/04/2019  . IR GASTROSTOMY TUBE REMOVAL  11/29/2019  . IR PATIENT EVAL TECH 0-60 MINS  09/29/2019  . IR US GUIDE VASC ACCESS LEFT  09/19/2019  . LAPAROSCOPIC LYSIS OF ADHESIONS N/A 09/06/2019   Procedure: LAPAROSCOPIC OMENTAL HARVEST;  Surgeon: Kinsinger, Arta Bruce, MD;  Location: [place];  Service: General;  Laterality: N/A;  . LEFT HEART CATH AND CORONARY ANGIOGRAPHY N/A 08/10/2019   Procedure: LEFT HEART CATH AND CORONARY ANGIOGRAPHY;  Surgeon: Nigel Mormon, MD;  Location: [place] [facility];  Service: Cardiovascular;  Laterality: N/A;  . LEFT HEART CATHETERIZATION WITH CORONARY ANGIOGRAM N/A 02/27/2014   Procedure: LEFT HEART CATHETERIZATION WITH  CORONARY ANGIOGRAM;  Surgeon: Laverda Page, MD;  Location[facility];  Service: Cardiovascular;  Laterality: N/A;  . MEDIASTINAL EXPLORATION N/A 09/06/2019   Procedure: MEDIASTINAL EXPLORATION;  Surgeon: Grace Oseias, MD;  Location: [place];  Service: Thoracic;  Laterality: N/A;  . PECTORALIS FLAP  09/06/2019   Procedure: Pectoralis ADVANCEMENT Flap;  Surgeon: Wallace Going, DO;  Location: [place];  Service: Plastics;;  . PERCUTANEOUS CORONARY STENT INTERVENTION (PCI-S)  02/27/2014   Procedure: PERCUTANEOUS CORONARY STENT INTERVENTION (PCI-S);  Surgeon: Laverda Page, MD;  Location: [facility] CATH LAB;  Service: Cardiovascular;;  rt PDA  3.0/28mm Promus stent  . RADIAL ARTERY HARVEST Left 08/15/2019   Procedure: Radial Artery Harvest;  Surgeon: [name], MD;  Location: [place];  Service: Open Heart Surgery;  Laterality: Left;  . RIB PLATING N/A 09/06/2019   Procedure: STERNAL PLATING;  Surgeon: Lanelle Bal  B, MD;  Location: [place];  Service: Thoracic;  Laterality: N/A;  . TEE WITHOUT CARDIOVERSION N/A 08/15/2019   Procedure: TRANSESOPHAGEAL ECHOCARDIOGRAM (TEE);  Surgeon: [name], MD;  Location: [place];  Service: Open Heart Surgery;  Laterality: N/A;  . TRACHEOSTOMY TUBE PLACEMENT  08/29/2019   Procedure: Tracheostomy;  Surgeon: [name], MD;  Location: [place] [facility];  Service: Open Heart Surgery;;  . TRANSURETHRAL RESECTION OF PROSTATE  04/04/2012   Procedure: TRANSURETHRAL RESECTION OF THE PROSTATE WITH GYRUS INSTRUMENTS;  Surgeon: Ailene Rud, MD;  Location: [place];  Service: Urology;  Laterality: N/A;  . TRANSURETHRAL RESECTION OF PROSTATE N/A 09/27/2014   Procedure: TRANSURETHRAL RESECTION OF THE PROSTATE ;  Surgeon: Carolan Clines, MD;  Location: [facility];  Service: Urology;  Laterality: N/A;  . UPPER GASTROINTESTINAL ENDOSCOPY     Social History   Tobacco Use  . Smoking status: Former Smoker    Packs/day: 0.50    Years: 20.00     Pack years: 10.00    Types: Cigarettes    Quit date: 1975    Years since quitting: 47.0  . Smokeless tobacco: Never Used  Substance Use Topics  . Alcohol use: Not Currently    Alcohol/week: 0.0 standard drinks    Comment: 1 beer week rarely   ROS   Review of Systems  Cardiovascular: Negative for chest pain, dyspnea on exertion and leg swelling.  Gastrointestinal: Negative for melena.  Neurological: Positive for disturbances in coordination (left sided weakness) and loss of balance.    Objective  Blood pressure 122/80, pulse 80, resp. rate 16, height 5\' 9"  (1.753 m), weight 207 lb (93.9 kg), SpO2 93 %.  Vitals with BMI 05/02/2020 03/25/2020 03/25/2020  Height 5\' 9"  - 5\' 9"   Weight 207 lbs - 187 lbs 3 oz  BMI 99.37 - 16.96  Systolic 789 381 017  Diastolic 80 78 80  Pulse 80 - 99    Physical Exam Constitutional:      Comments: He is moderately built and mildly obese in no acute distress.  Neck:     Thyroid: No thyromegaly.  Cardiovascular:     Rate and Rhythm: Normal rate and regular rhythm.     Pulses:          Carotid pulses are 2+ on the right side and 2+ on the left side.      Femoral pulses are 2+ on the right side and 2+ on the left side.      Popliteal pulses are 0 on the right side and 0 on the left side.       Dorsalis pedis pulses are 0 on the right side and 0 on the left side.       Posterior tibial pulses are 0 on the right side and 0 on the left side.     Heart sounds: Normal heart sounds. No murmur heard. No gallop.      Comments: NO pedal edema, no JVD.  Pulmonary:     Effort: Pulmonary effort is normal.     Breath sounds: Normal breath sounds.  Chest:     Comments: Sternotomy scar has healed well Abdominal:     General: Bowel sounds are normal.     Palpations: Abdomen is soft.     Laboratory examination:   Recent Labs    10/16/19 0232 10/17/19 0254 10/18/19 0046 12/20/19 1312 03/25/20 1155  NA 131* 131* 132* 136 144  K 3.9 3.9 3.9 3.7 4.3  CL  94* 95* 96* 97* 106  CO2 24 21* 23 33* 28  GLUCOSE 84 187* 175* 92 98  BUN 65* 83* 37* 12 38*  CREATININE 5.27* 6.48* 3.55* 2.87* 3.18*  CALCIUM 9.2 9.0 9.1 9.0 9.3  GFRNONAA 10* 8* 16*  --   --   GFRAA 12* 9* 19*  --   --    CrCl cannot be calculated (Patient's most recent lab result is older than the maximum 21 days allowed.).  CMP Latest Ref Rng & Units 03/25/2020 12/20/2019 10/18/2019  Glucose 70 - 99 mg/dL 98 92 175(H)  BUN 6 - 23 mg/dL 38(H) 12 37(H)  Creatinine 0.40 - 1.50 mg/dL 3.18(H) 2.87(H) 3.55(H)  Sodium 135 - 145 mEq/L 144 136 132(L)  Potassium 3.5 - 5.1 mEq/L 4.3 3.7 3.9  Chloride 96 - 112 mEq/L 106 97(L) 96(L)  CO2 19 - 32 mEq/L 28 33(H) 23  Calcium 8.4 - 10.5 mg/dL 9.3 9.0 9.1  Total Protein 6.1 - 8.1 g/dL - 5.7(L) -  Total Bilirubin 0.2 - 1.2 mg/dL - 0.4 -  Alkaline Phos 38 - 126 U/L - - -  AST 10 - 35 U/L - 21 -  ALT 9 - 46 U/L - 10 -   CBC Latest Ref Rng & Units 10/17/2019 10/14/2019 10/12/2019  WBC 4.0 - 10.5 K/uL 12.3(H) 14.2(H) 14.6(H)  Hemoglobin 13.0 - 17.0 g/dL 9.2(L) 9.2(L) 9.3(L)  Hematocrit 39.0 - 52.0 % 29.1(L) 30.2(L) 30.3(L)  Platelets 150 - 400 K/uL 346 377 369   Lipid Panel Recent Labs    08/11/19 0426 08/15/19 1958 08/24/19 2003 08/26/19 1028 08/27/19 1611  CHOL 107  --   --   --   --   TRIG 125   < > 117 131 71  LDLCALC 40  --   --   --   --   VLDL 25  --   --   --   --   HDL 42  --   --   --   --   CHOLHDL 2.5  --   --   --   --    < > = values in this interval not displayed.    HEMOGLOBIN A1C Lab Results  Component Value Date   HGBA1C 5.4 03/25/2020   MPG 94 12/20/2019   TSH No results for input(s): TSH in the last 8760 hours. Medications and allergies  No Known Allergies   Current Outpatient Medications on File Prior to Visit  Medication Sig Dispense Refill  . aspirin EC 81 MG tablet Take 81 mg by mouth daily.    Marland Kitchen atorvastatin (LIPITOR) 20 MG tablet TAKE 1 TABLET BY MOUTH EVERY DAY IN THE EVENING (Patient taking  differently: Take 20 mg by mouth every evening.) 90 tablet 3  . clonazePAM (KLONOPIN) 0.5 MG disintegrating tablet TAKE 1 TABLET BY MOUTH TWICE A DAY 60 tablet 5  . clopidogrel (PLAVIX) 75 MG tablet Take 1 tablet (75 mg total) by mouth daily. 90 tablet 3  . cromolyn (OPTICROM) 4 % ophthalmic solution SMARTSIG:1 Drop(s) In Eye(s) Every 6-12 Hours PRN    . [employer] ACETAMINOPHEN 325 MG tablet TAKE 2 CAPS BY MOUTH EVERY 6 HOURS AS NEEDED FOR PAIN FOR TEMP 100F OR ABOVE 60 tablet 0  . ferrous sulfate 325 (65 FE) MG tablet Take 325 mg by mouth daily with breakfast.    . furosemide (LASIX) 80 MG tablet Take 80 mg by mouth in the morning. Only take the AM dose, not  the PM    . isosorbide dinitrate (ISORDIL) 30 MG tablet Take 1 tablet (30 mg total) by mouth 3 (three) times daily. 90 tablet 2  . metoprolol tartrate (LOPRESSOR) 100 MG tablet Take 100 mg by mouth daily.    . multivitamin (RENA-VIT) TABS tablet Take 1 tablet by mouth at bedtime.  0  . nitroGLYCERIN (NITROSTAT) 0.4 MG SL tablet Place 0.4 mg under the tongue as directed.    Marland Kitchen oxycodone (OXY-IR) 5 MG capsule Take 5 mg by mouth every 4 (four) hours as needed for pain.    Marland Kitchen PREVIDENT 5000 BOOSTER PLUS 1.1 % PSTE Take 1 application by mouth daily.     . SENNA-PLUS 8.6-50 MG tablet TAKE 2 TABLETS BY MOUTH AT BEDTIME FOR CONSTIPATION (Patient taking differently: Take 1 tablet by mouth at bedtime as needed.) 60 tablet 1  . sertraline (ZOLOFT) 25 MG tablet TAKE 1 TABLET ONE TIME A DAY 90 tablet 1  . sevelamer carbonate (RENVELA) 800 MG tablet Take 1,600 mg by mouth 3 (three) times daily.    . silodosin (RAPAFLO) 8 MG CAPS capsule Take 8 mg by mouth at bedtime.    . tamsulosin (FLOMAX) 0.4 MG CAPS capsule Take 0.4 mg by mouth daily after breakfast.     . Trospium Chloride 60 MG CP24 Take 1 capsule by mouth daily.    . Vitamin D, Ergocalciferol, (DRISDOL) 1.25 MG (50000 UNIT) CAPS capsule Take 50,000 Units by mouth every 7 (seven) days.     No current  facility-administered medications on file prior to visit.    Radiology:  No results found.  Cardiac Studies:   Heart catheterization 08-18-19: LM: Normal LAD: Ostial 95% stenosis Ramus: Ostial/prox 30% stenosis LCx: Ostial 75% stenosis RCA: Prox-mid diffuse 40% calcific disease. Distal RCA 70% ISR of 3.0 by 28 mm Promus placed 02/27/2014.  Pre-CABG Dopplers including ABI and carotid duplex: No significant carotid artery stenosis.  Antegrade vertebral artery flow. ABI normal, with triphasic waveforms at the level of the ankle.  Hx of CABG 08/15/2019: LIMA TO LAD, RIMA TO PDA, SEQUENTIAL RADIAL ARTERY TO Cx-OM1.  Echocardiogram 08/19/2019:  1. There prominent respiratory displacement of the interventricular  septum, consistent with enhanced ventricular interdependence, likely due  to constrictive physiology.. Left ventricular ejection fraction, by  estimation, is 45 to 50%. The left ventricle  has mildly decreased function. The left ventricle demonstrates global  hypokinesis. Left ventricular diastolic parameters are consistent with  Grade II diastolic dysfunction (pseudonormalization). Elevated left atrial  pressure.  2. Right ventricular systolic function was not well visualized. The right  ventricular size is normal. Tricuspid regurgitation signal is inadequate  for assessing PA pressure.  3. No pericardial fluid is see, but the pericardium is very thick and  thrombus in the pericardial space cannot be ecluded.   EKG:      EKG 01/16/2020: Normal sinus rhythm/sinus tachycardia at rate of 110 bpm, normal axis, incomplete right bundle branch block.  No evidence of ischemia otherwise normal EKG.    Assessment     ICD-10-CM   1. Hypertension with heart disease  I11.9 hydrALAZINE (APRESOLINE) 25 MG tablet  2. Atherosclerosis of native coronary artery of native heart with stable angina pectoris ([place])  I25.118   3. End stage renal disease (HCC)  N18.6   4. Hx of CABG 08/15/2019:  LIMA TO LAD, RIMA TO PDA, SEQUENTIAL RADIAL ARTERY TO Cx-OM1.  Z95.1 AMB referral to cardiac rehabilitation    Meds ordered this encounter  Medications  .  hydrALAZINE (APRESOLINE) 25 MG tablet    Sig: Take 1 tablet (25 mg total) by mouth 3 (three) times daily.    Dispense:  270 tablet    Refill:  3   Medications Discontinued During This Encounter  Medication Reason  . furosemide (LASIX) 40 MG tablet Change in therapy  . Nystatin (GERHARDT'S BUTT CREAM) CREA No longer needed (for PRN medications)  . metoprolol succinate (TOPROL-XL) 50 MG 24 hr tablet Dose change  . metoprolol succinate (TOPROL-XL) 50 MG 24 hr tablet Dose change  . hydrALAZINE (APRESOLINE) 25 MG tablet Reorder    Orders Placed This Encounter  Procedures  . AMB referral to cardiac rehabilitation    Referral Priority:   Routine    Referral Type:   Consultation    Number of Visits Requested:   1    Recommendations:   MASYN ROSTRO  is a 75 y.o. AA male with hypertension, hyperlipidemia, DM with stage 3 CKD,  CAD with  stenting to the PDA on 02/27/2014. Patient presented with unstable angina and NSTEMI on 08/09/2019 along with a very small subsegmental pulmonary embolism and discharged on 10/18/2019 after he underwent CABG x4 on 08/15/2019.  Cholangiography revealing complex ostial LAD stenosis with TIMI II flow and a calcific 75% ostial circumflex stenosis with reduced LVEF.   He had extremely complex postop complications including cardiogenic shock, cardiac arrest, needing internal CPR in the ICU after opening the chest, A. Fib needing cardioversion, closure office chest wall, developed acute renal failure leading to need for permanent dialysis, right brain infarct with left hemiparesis, multiple blood transfusions.  He is now off of dialysis since 02/01/2020.  He is tolerating all his medications well, blood pressure is now well controlled on hydralazine 25 mg 3 times daily, also metoprolol succinate dose was increased by his  PCP from 50 to 100 mg daily in view of elevated blood pressure and also heart rate.  He has developed mild chest discomfort occasionally, advised him to use nitroglycerin on a as needed basis.  Will contact me if he has frequent episodes of chest tightness.  He also has musculoskeletal chest pain that is unrelated to exertion and sharp and lasts a few seconds.  He will take Tylenol for the same.  Weight loss again discussed with the patient.  Advised him to monitor his weight on a daily basis.  There is no clinical evidence of heart failure today, blood pressure is well controlled, I will see him back in 3 months for follow-up.    Adrian Prows, MD, [facility] 05/02/2020, 4:39 PM Office: [phone]

## 2020-05-02 NOTE — Patient Instructions (Signed)
Angina  Angina is very bad discomfort or pain in the chest, neck, arm, jaw, or back. The discomfort is caused by a lack of blood in the middle layer of the heart wall (myocardium). What are the causes? This condition is caused by a buildup of fat and cholesterol (plaque) in your arteries (atherosclerosis). This buildup narrows the arteries and makes it hard for blood to flow. What increases the risk? You are more likely to develop this condition if:  You have high levels of cholesterol in your blood.  You have high blood pressure (hypertension).  You have diabetes.  You have a family history of heart disease.  You are not active, or you do not exercise enough.  You feel sad (depressed).  You have been treated with high energy rays (radiation) on the left side of your chest. Other risk factors are:  Using tobacco.  Being very overweight (obese).  Eating a diet high in unhealthy fats (saturated fats).  Having stress, or being exposed to things that cause stress.  Using drugs, such as cocaine. Women have a greater risk for angina if:  They are older than 55.  They have stopped having their period (are in postmenopause). What are the signs or symptoms? Common symptoms of this condition in both men and women may include:  Chest pain, which may: ? Feel like a crushing or squeezing in the chest. ? Feel like a tightness, pressure, fullness, or heaviness in the chest. ? Last for more than a few minutes at a time. ? Stop and come back (recur) after a few minutes.  Pain in the neck, arm, jaw, or back.  Heartburn or upset stomach (indigestion) for no reason.  Being short of breath.  Feeling sick to your stomach (nauseous).  Sudden cold sweats. Women and people with diabetes may have other symptoms that are not usual, such as feeling:  Tired (fatigue).  Worried or nervous (anxious) for no reason.  Weak for no reason.  Dizzy or passing out (fainting). How is this  treated? This condition may be treated with:  Medicines. These are given to: ? Prevent blood clots. ? Prevent heart attack. ? Relax blood vessels and improve blood flow to the heart (nitrates). ? Reduce blood pressure. ? Improve the pumping action of the heart. ? Reduce fat and cholesterol in the blood.  A procedure to widen a narrowed or blocked artery in the heart (angioplasty).  Surgery to allow blood to go around a blocked artery (coronary artery bypass surgery). Follow these instructions at home: Medicines  Take over-the-counter and prescription medicines only as told by your doctor.  Do not take these medicines unless your doctor says that you can: ? NSAIDs. These include:  Ibuprofen.  Naproxen. ? Vitamin supplements that have vitamin A, vitamin E, or both. ? Hormone therapy that contains estrogen with or without progestin. Eating and drinking   Eat a heart-healthy diet that includes: ? Lots of fresh fruits and vegetables. ? Whole grains. ? Low-fat (lean) protein. ? Low-fat dairy products.  Follow instructions from your doctor about what you cannot eat or drink. Activity  Follow an exercise program that your doctor tells you.  Talk with your doctor about joining a program to help improve the health of your heart (cardiac rehab).  When you feel tired, take a break. Plan breaks if you know you are going to feel tired. Lifestyle   Do not use any products that contain nicotine or tobacco. This includes cigarettes, e-cigarettes, and   chewing tobacco. If you need help quitting, ask your doctor.  If your doctor says you can drink alcohol: ? Limit how much you use to:  0-1 drink a day for women who are not pregnant.  0-2 drinks a day for men. ? Be aware of how much alcohol is in your drink. In the [place] one drink equals:  One 12 oz bottle of beer (355 mL).  One 5 oz glass of wine (148 mL).  One 1 oz glass of hard liquor (44 mL). General instructions  Stay  at a healthy weight. If your doctor tells you to do so, work with him or her to lose weight.  Learn to deal with stress. If you need help, ask your doctor.  Keep your vaccines up to date. Get a flu shot every year.  Talk with your doctor if you feel sad. Take a screening test to see if you are at risk for depression.  Work with your doctor to manage any other health problems that you have. These may include diabetes or high blood pressure.  Keep all follow-up visits as told by your doctor. This is important. Get help right away if:  You have pain in your chest, neck, arm, jaw, or back, and the pain: ? Lasts more than a few minutes. ? Comes back. ? Does not get better after you take medicine under your tongue (sublingual nitroglycerin). ? Keeps getting worse. ? Comes more often.  You have any of these problems for no reason: ? Sweating a lot. ? Heartburn or upset stomach. ? Shortness of breath. ? Trouble breathing. ? Feeling sick to your stomach. ? Throwing up (vomiting). ? Feeling more tired than normal. ? Feeling nervous or worrying more than normal. ? Weakness.  You are suddenly dizzy or light-headed.  You pass out. These symptoms may be an emergency. Do not wait to see if the symptoms will go away. Get medical help right away. Call your local emergency services (911 in the [place] Do not drive yourself to the hospital. Summary  Angina is very bad discomfort or pain in the chest, neck, arm, neck, or back.  Symptoms include chest pain, heartburn or upset stomach for no reason, and shortness of breath.  Women or people with diabetes may have symptoms that are not usual, such as feeling nervous or worried for no reason, weak for no reason, or tired.  Take all medicines only as told by your doctor.  You should eat a heart-healthy diet and follow an exercise program. This information is not intended to replace advice given to you by your health care provider. Make sure you  discuss any questions you have with your health care provider. Document Revised: 11/29/2017 Document Reviewed: 11/29/2017 Elsevier Patient Education  2020 [employer]  

## 2020-05-06 ENCOUNTER — Encounter (HOSPITAL_COMMUNITY): Payer: Self-pay | Admitting: *Deleted

## 2020-05-06 NOTE — Progress Notes (Signed)
Received referral from Dr. Einar Gip for this pt to participate in Cardiac rehab with the diagnosis of NSTEMI and CABG x 4. P with complicated post op which prevented him from inpatient cardiac rehab phase I.  Clinical review of pt follow up appt on 05/02/20 with Dr. Einar Gip - cardiologist office note. Pt is making the expected progress in recovery.  Pt appropriate for scheduling for on site cardiac rehab and/or enrollment in Virtual Cardiac Rehab.  Pt Covid Risk Score is 9.  Will forward to staff for follow up. Cherre Huger, BSN Cardiac and Training and development officer

## 2020-05-08 DIAGNOSIS — I129 Hypertensive chronic kidney disease with stage 1 through stage 4 chronic kidney disease, or unspecified chronic kidney disease: Secondary | ICD-10-CM | POA: Diagnosis not present

## 2020-05-08 DIAGNOSIS — N2581 Secondary hyperparathyroidism of renal origin: Secondary | ICD-10-CM | POA: Diagnosis not present

## 2020-05-08 DIAGNOSIS — E1122 Type 2 diabetes mellitus with diabetic chronic kidney disease: Secondary | ICD-10-CM | POA: Diagnosis not present

## 2020-05-08 DIAGNOSIS — N184 Chronic kidney disease, stage 4 (severe): Secondary | ICD-10-CM | POA: Diagnosis not present

## 2020-05-08 DIAGNOSIS — D631 Anemia in chronic kidney disease: Secondary | ICD-10-CM | POA: Diagnosis not present

## 2020-05-08 DIAGNOSIS — I2510 Atherosclerotic heart disease of native coronary artery without angina pectoris: Secondary | ICD-10-CM | POA: Diagnosis not present

## 2020-05-14 ENCOUNTER — Telehealth: Payer: Self-pay | Admitting: Internal Medicine

## 2020-05-14 DIAGNOSIS — Z992 Dependence on renal dialysis: Secondary | ICD-10-CM | POA: Diagnosis not present

## 2020-05-14 DIAGNOSIS — N186 End stage renal disease: Secondary | ICD-10-CM | POA: Diagnosis not present

## 2020-05-14 DIAGNOSIS — R262 Difficulty in walking, not elsewhere classified: Secondary | ICD-10-CM | POA: Diagnosis not present

## 2020-05-14 DIAGNOSIS — I214 Non-ST elevation (NSTEMI) myocardial infarction: Secondary | ICD-10-CM | POA: Diagnosis not present

## 2020-05-14 NOTE — Telephone Encounter (Signed)
Patients wife called and said that the patient has not taken any of his daily medications and he has just been laying around all day. Transferred to [facility].

## 2020-05-15 NOTE — Telephone Encounter (Signed)
[facility] FYI   Caller states her husband is refusing to eat or take his medicine.He states that he is tired. She wants to know if she can give the morning medicine now. States he is in the bathroom right now. Was able to get him to eat breakfast. No confusion. Just states he is tired. Spoke with caller and he states that he feels fine and told wife not to call. Wanted to know about his medication. Informed her that if he takes the medication once per day should go ahead and take that medication if he takes it more than once per day should resume schedule at next dose.

## 2020-05-18 DIAGNOSIS — I509 Heart failure, unspecified: Secondary | ICD-10-CM | POA: Diagnosis not present

## 2020-05-18 DIAGNOSIS — Z931 Gastrostomy status: Secondary | ICD-10-CM | POA: Diagnosis not present

## 2020-05-18 DIAGNOSIS — Z951 Presence of aortocoronary bypass graft: Secondary | ICD-10-CM | POA: Diagnosis not present

## 2020-05-23 ENCOUNTER — Telehealth (HOSPITAL_COMMUNITY): Payer: Self-pay

## 2020-05-23 NOTE — Telephone Encounter (Signed)
Pt insurance is active and benefits verified through [facility]. Co-pay $0.00, DED $0.00/$0.00 met, out of pocket $4,500.00/$70.00 met, co-insurance 0%. No pre-authorization required. Passport, 05/23/20 @ 4:17PM, [id number]  2ndary insurance is active and benefits verified through [employer]. Co-pay $0.00, DED $350.00/$0.00 met, out of pocket $6,000.00/$26.82 met, co-insurance 15%. No pre-authorization required. Passport, 05/23/20 @ 4:23PM, [id number]  Will contact patient to see if he is interested in the Cardiac Rehab Program.

## 2020-05-24 ENCOUNTER — Ambulatory Visit: Payer: Medicare Other | Admitting: Pharmacist

## 2020-05-24 ENCOUNTER — Other Ambulatory Visit: Payer: Self-pay

## 2020-05-24 DIAGNOSIS — Z951 Presence of aortocoronary bypass graft: Secondary | ICD-10-CM

## 2020-05-24 DIAGNOSIS — E119 Type 2 diabetes mellitus without complications: Secondary | ICD-10-CM

## 2020-05-24 DIAGNOSIS — I10 Essential (primary) hypertension: Secondary | ICD-10-CM

## 2020-05-24 DIAGNOSIS — E785 Hyperlipidemia, unspecified: Secondary | ICD-10-CM

## 2020-05-24 DIAGNOSIS — N184 Chronic kidney disease, stage 4 (severe): Secondary | ICD-10-CM

## 2020-05-24 DIAGNOSIS — E1169 Type 2 diabetes mellitus with other specified complication: Secondary | ICD-10-CM

## 2020-05-24 NOTE — Telephone Encounter (Signed)
Called patient to see if he was interested in participating in the Cardiac Rehab Program. Patient stated yes. Patient will come in for orientation on 07/05/20 @ 10:30AM and will attend the 1:45PM exercise class. Went over insurance, patient verbalized understanding.  Tourist information centre manager.

## 2020-05-24 NOTE — Telephone Encounter (Signed)
Pt will come in for CR orientation 06/25/20 @ 10:30AM.

## 2020-05-24 NOTE — Progress Notes (Signed)
Chronic Care Management Pharmacy Note  05/24/2020 Name:  Christian [name] MRN:  [id number] DOB:  11-13-1945  Subjective: Christian [name] is an 75 y.o. year old male who is a primary patient of Hoyt Koch, MD.  The CCM team was consulted for assistance with disease management and care coordination needs.    Engaged with patient by telephone for follow up visit in response to provider referral for pharmacy case management and/or care coordination services.   Consent to Services:  The patient was given the following information about Chronic Care Management services today, agreed to services, and gave verbal consent: 1. CCM service includes personalized support from designated clinical staff supervised by the primary care provider, including individualized plan of care and coordination with other care providers 2. 24/7 contact phone numbers for assistance for urgent and routine care needs. 3. Service will only be billed when office clinical staff spend 20 minutes or more in a month to coordinate care. 4. Only one practitioner may furnish and bill the service in a calendar month. 5.The patient may stop CCM services at any time (effective at the end of the month) by phone call to the office staff. 6. The patient will be responsible for cost sharing (co-pay) of up to 20% of the service fee (after annual deductible is met). Patient agreed to services and consent obtained.  Patient Care Team: Hoyt Koch, MD as PCP - General (Internal Medicine) Gardiner Barefoot, DPM as Consulting Physician (Podiatry) McKenzie, Candee Furbish, MD as Consulting Physician (Urology) Woody Seller Sylvan Cheese, MD as Referring Physician (Cardiology) Charlton Haws, [facility] as Pharmacist (Pharmacist)  Patient lives at home with wife who handles his medications. She sets up pill box for him and keeps track of changes on med list. Patient is currently getting help from home health nurse 3 days a week but this service will  end soon per patient. He is interested in getting into [facility] for supervised physical therapy and exercise.  05/14/20 - patient reports feeling "bad", he is not eating, difficult to get of bed, no motivation to do anything, and trouble remembering things. He had a phone call with cardiac rehab yesterday that he did not remember today. This has been going on for 2 weeks per patient, even longer per wife.   Recent office visits: 03/25/20 Dr Sharlet Salina OV: chronic f/u, pt recovering from complicated CABG in spring. Following w/ cardiology and nephrology. No med changes.  Recent consult visits: 05/09/20 Dr Royce Macadamia (nephrology): increased furosmide to 80 mg BID. Referred to [facility] for AVF placement. F/u with urology for incontinence issues.  05/03/19 Dr Einar Gip (cardiology): continue same meds. Scheduled for cardiac rehab in March.  03/05/20 Dr Einar Gip (cardiology): BP 158/96.  off HD since 02/01/20. Increased metoprolol to 50 mg, added isosorbide DN 30 mg TID and hydralazine 25 mg TID.  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  Objective:  Lab Results  Component Value Date   CREATININE 3.18 (H) 03/25/2020   BUN 38 (H) 03/25/2020   GFR 18.69 (L) 03/25/2020   GFRNONAA 16 (L) 10/18/2019   GFRAA 19 (L) 10/18/2019   NA 144 03/25/2020   K 4.3 03/25/2020   CALCIUM 9.3 03/25/2020   CO2 28 03/25/2020    Lab Results  Component Value Date/Time   HGBA1C 5.4 03/25/2020 11:55 AM   HGBA1C 4.9 12/20/2019 01:12  PM   GFR 18.69 (L) 03/25/2020 11:55 AM   GFR 43.64 (L) 08/09/2019 09:22 AM    Last diabetic Eye exam:  Lab Results  Component Value Date/Time   HMDIABEYEEXA No Retinopathy 10/13/2017 12:00 AM    Last diabetic Foot exam: No results found for: HMDIABFOOTEX   Lab Results  Component Value Date   CHOL 107 08/11/2019   HDL 42 08/11/2019   LDLCALC  40 08/11/2019   LDLDIRECT 40.0 11/23/2018   TRIG 71 08/27/2019   CHOLHDL 2.5 08/11/2019    Hepatic Function Latest Ref Rng & Units 03/25/2020 12/20/2019 10/18/2019  Total Protein 6.1 - 8.1 g/dL - 5.7(L) -  Albumin 3.5 - 5.2 g/dL 4.1 - 2.6(L)  AST 10 - 35 U/L - 21 -  ALT 9 - 46 U/L - 10 -  Alk Phosphatase 38 - 126 U/L - - -  Total Bilirubin 0.2 - 1.2 mg/dL - 0.4 -    Lab Results  Component Value Date/Time   TSH 2.590 04/19/2019 09:11 AM   TSH 1.73 10/19/2017 11:10 AM    CBC Latest Ref Rng & Units 10/17/2019 10/14/2019 10/12/2019  WBC 4.0 - 10.5 K/uL 12.3(H) 14.2(H) 14.6(H)  Hemoglobin 13.0 - 17.0 g/dL 9.2(L) 9.2(L) 9.3(L)  Hematocrit 39.0 - 52.0 % 29.1(L) 30.2(L) 30.3(L)  Platelets 150 - 400 K/uL 346 377 369    Lab Results  Component Value Date/Time   VD25OH 95.53 10/19/2017 11:10 AM   VD25OH 107.36 (HH) 08/27/2016 01:43 PM    Clinical ASCVD: Yes  The ASCVD Risk score Mikey Bussing DC Jr., et al., 2013) failed to calculate for the following reasons:   The patient has a prior MI or stroke diagnosis    Depression screen [facility] 2/9 11/23/2018 10/18/2017 03/29/2017  Decreased Interest 0 0 1  Down, Depressed, Hopeless 0 0 0  PHQ - 2 Score 0 0 1   BP Readings from Last 3 Encounters:  05/02/20 122/80  03/25/20 140/78  03/05/20 (!) 158/96   Pulse Readings from Last 3 Encounters:  05/02/20 80  03/25/20 99  03/05/20 (!) 106   Wt Readings from Last 3 Encounters:  05/02/20 207 lb (93.9 kg)  03/25/20 187 lb 3.2 oz (84.9 kg)  03/05/20 181 lb (82.1 kg)    Assessment/Interventions: Review of patient past medical history, allergies, medications, health status, including review of consultants reports, laboratory and other test data, was performed as part of comprehensive evaluation and provision of chronic care management services.   SDOH:  (Social Determinants of Health) assessments and interventions performed:    CCM Care Plan  No Known Allergies  Medications Reviewed Today     Reviewed by Charlton Haws, [facility] (Pharmacist) on 05/24/20 at 1405  Med List Status: <None>  Medication Order Taking? Sig Documenting Provider Last Dose Status Informant  aspirin EC 81 MG tablet [id number] Yes Take 81 mg by mouth daily. [provider] Taking Active Multiple Informants  atorvastatin (LIPITOR) 20 MG tablet [id number] Yes TAKE 1 TABLET BY MOUTH EVERY DAY IN THE EVENING  Patient taking differently: Take 20 mg by mouth every evening.   Adrian Prows, MD Taking Active   clonazePAM Bobbye [place]) 0.5 MG disintegrating tablet [id number] Yes TAKE 1 TABLET BY MOUTH TWICE A DAY Hoyt Koch, MD Taking Active   clopidogrel (PLAVIX) 75 MG tablet [id number] Yes Take 1 tablet (75 mg total) by mouth daily. Adrian Prows, MD Taking Active   cromolyn (OPTICROM) 4 % ophthalmic solution [id number] Yes SMARTSIG:1 Drop(s) In Eye(s) Every 6-12  Hours PRN [provider] Taking Active   [facility] ACETAMINOPHEN 325 MG tablet [id number] Yes TAKE 2 CAPS BY MOUTH EVERY 6 HOURS AS NEEDED FOR PAIN FOR TEMP 100F OR ABOVE Hoyt Koch, MD Taking Active   ferrous sulfate 325 (65 FE) MG tablet [id number] Yes Take 325 mg by mouth daily with breakfast. [provider] Taking Active            Med Note Maryjean Morn, RICARDO K   Wed Apr 24, 2020  2:09 PM)    furosemide (LASIX) 80 MG tablet [id number] Yes Take 80 mg by mouth 2 (two) times daily. Dose changed by nephrologist 05/09/20 [provider] Taking Active   hydrALAZINE (APRESOLINE) 25 MG tablet [id number] Yes Take 1 tablet (25 mg total) by mouth 3 (three) times daily. Adrian Prows, MD Taking Active   isosorbide dinitrate (ISORDIL) 30 MG tablet [id number] Yes Take 1 tablet (30 mg total) by mouth 3 (three) times daily. Adrian Prows, MD Taking Active   metoprolol succinate (TOPROL-XL) 100 MG 24 hr tablet [id number] Yes Take 100 mg by mouth daily. Take with or immediately following a meal. [provider] Taking Active   multivitamin  (RENA-VIT) TABS tablet [id number] Yes Take 1 tablet by mouth at bedtime. Barrett, Lodema Hong, PA-C Taking Active   nitroGLYCERIN (NITROSTAT) 0.4 MG SL tablet [id number] Yes Place 0.4 mg under the tongue as directed. [provider] Taking Active   oxycodone (OXY-IR) 5 MG capsule [id number] Yes Take 5 mg by mouth every 4 (four) hours as needed for pain. [provider] Taking Active            Med Note Charlton Haws   Fri Apr 12, 2020  2:12 PM) Pt uses very infrequently, every few weeks  PREVIDENT 5000 BOOSTER PLUS 1.1 % PSTE [id number] Yes Take 1 application by mouth daily.  [provider] Taking Active Multiple Informants  SENNA-PLUS 8.6-50 MG tablet [id number] Yes TAKE 2 TABLETS BY MOUTH AT BEDTIME FOR CONSTIPATION  Patient taking differently: Take 1 tablet by mouth at bedtime as needed.   Hoyt Koch, MD Taking Active   sertraline (ZOLOFT) 25 MG tablet [id number] Yes TAKE 1 TABLET ONE TIME A DAY Hoyt Koch, MD Taking Active   sevelamer carbonate (RENVELA) 800 MG tablet [id number] Yes Take 1,600 mg by mouth 3 (three) times daily. [provider] Taking Active   silodosin (RAPAFLO) 8 MG CAPS capsule [id number] Yes Take 8 mg by mouth at bedtime. [provider] Taking Active Multiple Informants  tamsulosin (FLOMAX) 0.4 MG CAPS capsule [id number] Yes Take 0.4 mg by mouth daily after breakfast.  [provider] Taking Active Multiple Informants  Trospium Chloride 60 MG CP24 [id number] Yes Take 1 capsule by mouth daily. [provider] Taking Active Multiple Informants  Vitamin D, Ergocalciferol, (DRISDOL) 1.25 MG (50000 UNIT) CAPS capsule [id number] Yes Take 50,000 Units by mouth every 7 (seven) days. [provider] Taking Active           Patient Active Problem List   Diagnosis Date Noted  . Anemia due to blood loss 04/24/2020  . CKD (chronic kidney disease) stage 4, GFR 15-29 ml/min (HCC) 03/27/2020  .  Aortic atherosclerosis ([place]) 03/27/2020  . 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  . Pruritus, unspecified 11/28/2019  . Unspecified protein-calorie malnutrition ([place]) 10/27/2019  . Secondary hyperparathyroidism of renal origin ([place]) 10/17/2019  . Coronary artery disease 08/15/2019  . 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  LIMA TO LAD, RIMA TO PDA, RADIAL ARTERY TO CIRC AND SEQUENTIALLY TO OM1. 08/15/2019  . Ischemic cardiomyopathy   . Swelling of lower extremity   . History of coronary artery stent placement   . Pulmonary embolism ([place]) 08/09/2019  . Elevated liver enzymes 08/09/2019  . Left leg weakness 11/23/2018  . CAD (coronary artery disease) 07/30/2018  . Routine general medical examination at a health care facility 08/30/2016  . Diabetes mellitus with polyneuropathy ([place]) 10/10/2014  . Benign prostatic hyperplasia 09/27/2014  . Stable angina pectoris ([place]) 02/27/2014  . S/P PTCA (percutaneous transluminal coronary angioplasty) 02/27/2014  . Hyperlipidemia associated with type 2 diabetes mellitus ([place]) 11/12/2009  . MULTIPLE SCLEROSIS 11/12/2009  . HTN (hypertension) 11/12/2009    Immunization History  Administered Date(s) Administered  . Fluad Quad(high Dose 65+) 01/09/2019  . Influenza, High Dose Seasonal PF 02/08/2017, 01/17/2018, 01/30/2020  . Influenza,inj,Quad PF,6+ Mos 02/28/2014, 02/11/2015  . Influenza-Unspecified 03/11/2017  . PFIZER(Purple Top)SARS-COV-2 Vaccination 06/01/2019, 06/22/2019, 01/18/2020  . Pneumococcal Conjugate-13 08/13/2015, 01/09/2019  . Pneumococcal Polysaccharide-23 02/28/2014  . Tdap 08/13/2015    Conditions to be addressed/monitored:  CAD, HTN, HLD and CKD Stage 4  Patient Care Plan: CCM Pharmacy Care Plan    Problem Identified: HTN, HLD, CAD, DM, CKD IV   Priority: High    Goal: Patient-Specific Goal   Start Date: 05/24/2020  Expected End Date: 11/21/2020  This Visit's Progress: On track   Priority: High  Note:   Current Barriers:  . Unable to independently monitor therapeutic efficacy . Unable to self administer medications as prescribed  Pharmacist Clinical Goal(s):  Marland Kitchen Over the next 30 days, patient will achieve adherence to monitoring guidelines and medication adherence to achieve therapeutic efficacy through collaboration with PharmD and provider.   Interventions: . 1:1 collaboration with Hoyt Koch, MD regarding development and update of comprehensive plan of care as evidenced by provider attestation and co-signature . Inter-disciplinary care team collaboration (see longitudinal plan of care) . Comprehensive medication review performed; medication list updated in electronic medical record  Hypertension (BP goal <130/80) -controlled - improved after increasing metoprolol to 100 mg last visit -Current treatment: . Metoprolol succinate 100 mg daily . Furosemide 80 mg BID . Hydralazine 25 mg TID . Isosorbide DN 30 mg TID -Current home readings: not available -Current dietary habits: pt reports poor appetite, not eating much for last 2 weeks -Current exercise habits: limited - "laying around all day" -Denies hypotensive/hypertensive symptoms -Educated on BP goals and benefits of medications for prevention of heart attack, stroke and kidney damage; Importance of home blood pressure monitoring; -Counseled to monitor BP at home daily, document, and provide log at future appointments -Recommended to continue current medication  Hyperlipidemia/ CAD: (LDL goal < 70) -controlled -Hx CAD. CABG 10/5641 w/ complicated post-op including cardiac arrest and acute renal failure requiring HD. -Current treatment: . Atorvastatin 20 mg daily . Clopidogrel 75 mg daily . Aspirin 81 mg daily . Isosorbide DN 30 mg TID . Nitroglycerin 0.4 mg SL prn -Educated on Cholesterol goals;  Benefits of statin for ASCVD risk reduction; -Recommended to continue current  medication  Diabetes (A1c goal <7%) -controlled - diet only -Current medications: . No medications -Educated onA1c and blood sugar goals; Complications of diabetes including kidney damage, retinal damage, and cardiovascular disease; -Counseled to check feet daily and get yearly eye exams -Counseled on diet and exercise extensively  Health Maintenance -pt reports unable to eat, unable to get out  of bed, trouble remembering things, feels "bad" generally -discussed this may be depression or something physiological; advised he see PCP at earliest conveneince -Scheduled PCP appt for 2/1 @ 1:40 pm   Patient Goals/Self-Care Activities . Over the next 30 days, patient will:  - take medications as prescribed see PCP next week to address new issues with fatigue, poor appetite, general malaise  Follow Up Plan: Telephone follow up appointment with care management team member scheduled for: 1 month     Medication Assistance: None required.  Patient affirms current coverage meets needs.  Patient's preferred pharmacy is:  [id number] [place], [place] [id number]: [phone]: [phone] Uses pill box? Yes - set up by wife Pt endorses 100% compliance  We discussed: Current pharmacy is preferred with insurance plan and patient is satisfied with pharmacy services  Plan: Continue current medication management strategy    Follow Up:  Patient agrees to Care Plan and Follow-up.  Plan: Telephone follow up appointment with care management team member scheduled for:  1 month  LCharlene Brooke PharmD, BTourney Plaza Surgical CenterClinical Pharmacist [facility]Primary [facility] at [phone]

## 2020-05-24 NOTE — Patient Instructions (Signed)
Visit Information  Phone number for Pharmacist: [phone]  Goals Addressed            This Visit's Progress   . Follow My Treatment Plan-Chronic Kidney       Timeframe:  Long-Range Goal Priority:  High Start Date:       05/24/20                      Expected End Date:       11/21/20                - ask for help if I can't afford my medicines - call for medicine refill 2 or 3 days before it runs out - call the doctor or nurse before I stop taking medicine - call the doctor or nurse to get help with side effects - keep follow-up appointments - keep taking my medicines, even when I feel good    Why is this important?    Staying as healthy as you can is very important. This may mean making changes if you smoke, don't exercise or eat poorly.   A healthy lifestyle is an important goal for you.   Following the treatment plan and making changes may be hard.   Try some of these steps to help keep the disease from getting worse.        Patient Care Plan: CCM Pharmacy Care Plan    Problem Identified: HTN, HLD, CAD, DM, CKD IV   Priority: High    Goal: Patient-Specific Goal   Start Date: 05/24/2020  Expected End Date: 11/21/2020  This Visit's Progress: On track  Priority: High  Note:   Current Barriers:  . Unable to independently monitor therapeutic efficacy . Unable to self administer medications as prescribed  Pharmacist Clinical Goal(s):  Marland Kitchen Over the next 30 days, patient will achieve adherence to monitoring guidelines and medication adherence to achieve therapeutic efficacy through collaboration with PharmD and provider.   Interventions: . 1:1 collaboration with Hoyt Koch, MD regarding development and update of comprehensive plan of care as evidenced by provider attestation and co-signature . Inter-disciplinary care team collaboration (see longitudinal plan of care) . Comprehensive medication review performed; medication list updated in electronic medical  record  Hypertension (BP goal <130/80) -controlled - improved after increasing metoprolol to 100 mg last visit -Current treatment: . Metoprolol succinate 100 mg daily . Furosemide 80 mg BID . Hydralazine 25 mg TID . Isosorbide DN 30 mg TID -Current home readings: not available -Current dietary habits: pt reports poor appetite, not eating much for last 2 weeks -Current exercise habits: limited - "laying around all day" -Denies hypotensive/hypertensive symptoms -Educated on BP goals and benefits of medications for prevention of heart attack, stroke and kidney damage; Importance of home blood pressure monitoring; -Counseled to monitor BP at home daily, document, and provide log at future appointments -Recommended to continue current medication  Hyperlipidemia/ CAD: (LDL goal < 70) -controlled -Hx CAD. CABG 09/2950 w/ complicated post-op including cardiac arrest and acute renal failure requiring HD. -Current treatment: . Atorvastatin 20 mg daily . Clopidogrel 75 mg daily . Aspirin 81 mg daily . Isosorbide DN 30 mg TID . Nitroglycerin 0.4 mg SL prn -Educated on Cholesterol goals;  Benefits of statin for ASCVD risk reduction; -Recommended to continue current medication  Diabetes (A1c goal <7%) -controlled - diet only -Current medications: . No medications -Educated onA1c and blood sugar goals; Complications of diabetes including kidney damage, retinal damage, and  cardiovascular disease; -Counseled to check feet daily and get yearly eye exams -Counseled on diet and exercise extensively  Health Maintenance -pt reports unable to eat, unable to get out of bed, trouble remembering things, feels "bad" generally -discussed this may be depression or something physiological; advised he see PCP at earliest conveneince -Scheduled PCP appt for 2/1 @ 1:40 pm   Patient Goals/Self-Care Activities . Over the next 30 days, patient will:  - take medications as prescribed see PCP next week to  address new issues with fatigue, poor appetite, general malaise  Follow Up Plan: Telephone follow up appointment with care management team member scheduled for: 1 month    The patient verbalized understanding of instructions, educational materials, and care plan provided today and declined offer to receive copy of patient instructions, educational materials, and care plan.  Telephone follow up appointment with pharmacy team member scheduled for: 1 month  Charlene Brooke, PharmD, [facility] Clinical Pharmacist [facility] [phone]

## 2020-05-28 ENCOUNTER — Ambulatory Visit (INDEPENDENT_AMBULATORY_CARE_PROVIDER_SITE_OTHER): Payer: Medicare Other | Admitting: Internal Medicine

## 2020-05-28 ENCOUNTER — Encounter: Payer: Self-pay | Admitting: Internal Medicine

## 2020-05-28 ENCOUNTER — Other Ambulatory Visit: Payer: Self-pay

## 2020-05-28 VITALS — BP 122/80 | HR 72 | Temp 98.0°F | Resp 18 | Ht 69.0 in | Wt 177.6 lb

## 2020-05-28 DIAGNOSIS — N184 Chronic kidney disease, stage 4 (severe): Secondary | ICD-10-CM | POA: Diagnosis not present

## 2020-05-28 DIAGNOSIS — N401 Enlarged prostate with lower urinary tract symptoms: Secondary | ICD-10-CM

## 2020-05-28 DIAGNOSIS — R350 Frequency of micturition: Secondary | ICD-10-CM | POA: Diagnosis not present

## 2020-05-28 DIAGNOSIS — R5383 Other fatigue: Secondary | ICD-10-CM | POA: Diagnosis not present

## 2020-05-28 DIAGNOSIS — G479 Sleep disorder, unspecified: Secondary | ICD-10-CM | POA: Diagnosis not present

## 2020-05-28 DIAGNOSIS — R630 Anorexia: Secondary | ICD-10-CM

## 2020-05-28 LAB — BRAIN NATRIURETIC PEPTIDE: Pro B Natriuretic peptide (BNP): 64 pg/mL (ref 0.0–100.0)

## 2020-05-28 LAB — COMPREHENSIVE METABOLIC PANEL
ALT: 28 U/L (ref 0–53)
AST: 32 U/L (ref 0–37)
Albumin: 4.1 g/dL (ref 3.5–5.2)
Alkaline Phosphatase: 80 U/L (ref 39–117)
BUN: 68 mg/dL — ABNORMAL HIGH (ref 6–23)
CO2: 23 mEq/L (ref 19–32)
Calcium: 9.8 mg/dL (ref 8.4–10.5)
Chloride: 107 mEq/L (ref 96–112)
Creatinine, Ser: 3.33 mg/dL — ABNORMAL HIGH (ref 0.40–1.50)
GFR: 17.66 mL/min — ABNORMAL LOW (ref >60.00)
Glucose, Bld: 103 mg/dL — ABNORMAL HIGH (ref 70–99)
Potassium: 4 mEq/L (ref 3.5–5.1)
Sodium: 139 mEq/L (ref 135–145)
Total Bilirubin: 0.3 mg/dL (ref 0.2–1.2)
Total Protein: 8 g/dL (ref 6.0–8.3)

## 2020-05-28 LAB — CBC
HCT: 35.7 % — ABNORMAL LOW (ref 39.0–52.0)
Hemoglobin: 11.9 g/dL — ABNORMAL LOW (ref 13.0–17.0)
MCHC: 33.4 g/dL (ref 30.0–36.0)
MCV: 98.2 fl (ref 78.0–100.0)
Platelets: 367 10*3/uL (ref 150.0–400.0)
RBC: 3.63 Mil/uL — ABNORMAL LOW (ref 4.22–5.81)
RDW: 14.9 % (ref 11.5–15.5)
WBC: 8 10*3/uL (ref 4.0–10.5)

## 2020-05-28 LAB — TSH: TSH: 1.24 u[IU]/mL (ref 0.35–4.50)

## 2020-05-28 LAB — VITAMIN D 25 HYDROXY (VIT D DEFICIENCY, FRACTURES): VITD: 58.52 ng/mL (ref 30.00–100.00)

## 2020-05-28 LAB — FERRITIN: Ferritin: 831.9 ng/mL — ABNORMAL HIGH (ref 22.0–322.0)

## 2020-05-28 LAB — VITAMIN B12: Vitamin B-12: 922 pg/mL — ABNORMAL HIGH (ref 211–911)

## 2020-05-28 NOTE — Progress Notes (Unsigned)
Subjective:   Patient ID: Christian [name], male    DOB: May 09, 1945, 75 y.o.   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 Still seeing nephrologist and still off dialysis currently.    Review of Systems  Constitutional: Positive for activity change, appetite change and fatigue. Negative for chills, fever and unexpected weight change.  HENT: Negative.   Eyes: Negative.   Respiratory: Positive for shortness of breath. Negative for cough and chest tightness.   Cardiovascular: Positive for leg swelling. Negative for chest pain and palpitations.  Gastrointestinal: Negative for abdominal distention, abdominal pain, constipation, diarrhea, nausea and vomiting.  Musculoskeletal: Negative.   Skin: Negative.   Neurological: Negative.   Psychiatric/Behavioral: Positive for decreased concentration and sleep disturbance.    Objective:  Physical Exam Constitutional:      Appearance: He is well-developed and well-nourished.  HENT:     Head: Normocephalic and atraumatic.  Eyes:     Extraocular Movements: EOM normal.  Cardiovascular:     Rate and Rhythm: Normal rate and regular rhythm.  Pulmonary:     Effort: Pulmonary effort is normal.  No respiratory distress.     Breath sounds: Normal breath sounds. No wheezing or rales.  Abdominal:     General: Bowel sounds are normal. There is no distension.     Palpations: Abdomen is soft.     Tenderness: There is no abdominal tenderness. There is no rebound.  Musculoskeletal:        General: No edema.     Cervical back: Normal range of motion.  Skin:    General: Skin is warm and dry.  Neurological:     Mental Status: He is alert and oriented to person, place, and time.     Coordination: Coordination normal.  Psychiatric:        Mood and Affect: Mood and affect normal.     Comments: Decreased concentration during the visit and wife provides most of the history.      Vitals:   05/28/20 1410  BP: 122/80  Pulse: 72  Resp: 18  Temp: 98 F (36.7 C)  TempSrc: Oral  SpO2: 97%  Weight: 177 lb 9.6 oz (80.6 kg)  Height: 5\' 9"  (1.753 m)    This visit occurred during the SARS-CoV-2 public health emergency.  Safety protocols were in place, including screening questions prior to the visit, additional usage of staff PPE, and extensive cleaning of exam room while observing appropriate contact time as indicated for disinfecting solutions.   Assessment & Plan:  Visit time 35 minutes in face to face communication with patient and coordination of care, additional 10 minutes spent in record review, coordination or care, ordering tests, communicating/referring to other healthcare professionals, documenting  in medical records all on the same day of the visit for total time 45 minutes spent on the visit.

## 2020-05-28 NOTE — Patient Instructions (Addendum)
You can move the lasix (furosemide) to the morning and around lunchtime. If taking the first dose around lunchtime take the second dose around 2-3 PM.   We are checking the labs today to look for the cause of being tired and no appetite.   We will have you stop the flomax (tamsulosin) and silodosin (rapaflow). If you notice anything it may be more urination or more urgency with urination.  If the labs are normal we will send in remeron (mirtazepine) to take in the evening to help with appetite and energy.

## 2020-05-30 DIAGNOSIS — E119 Type 2 diabetes mellitus without complications: Secondary | ICD-10-CM | POA: Diagnosis not present

## 2020-05-30 DIAGNOSIS — R630 Anorexia: Secondary | ICD-10-CM | POA: Insufficient documentation

## 2020-05-30 DIAGNOSIS — G479 Sleep disorder, unspecified: Secondary | ICD-10-CM

## 2020-05-30 DIAGNOSIS — H40013 Open angle with borderline findings, low risk, bilateral: Secondary | ICD-10-CM | POA: Diagnosis not present

## 2020-05-30 DIAGNOSIS — H04123 Dry eye syndrome of bilateral lacrimal glands: Secondary | ICD-10-CM | POA: Diagnosis not present

## 2020-05-30 DIAGNOSIS — H10413 Chronic giant papillary conjunctivitis, bilateral: Secondary | ICD-10-CM | POA: Diagnosis not present

## 2020-05-30 DIAGNOSIS — H25813 Combined forms of age-related cataract, bilateral: Secondary | ICD-10-CM | POA: Diagnosis not present

## 2020-05-30 HISTORY — DX: Sleep disorder, unspecified: G47.9

## 2020-05-30 LAB — HM DIABETES EYE EXAM

## 2020-05-30 MED ORDER — MIRTAZAPINE 15 MG PO TABS: 15.0000 mg | ORAL_TABLET | Freq: Every day | ORAL | 5 refills | Status: DC

## 2020-05-30 NOTE — Assessment & Plan Note (Signed)
Checking CMP today to rule out progression causing fatigue.

## 2020-05-30 NOTE — Assessment & Plan Note (Signed)
We will try him off the flomax and rapaflo as the lasix is likely causing these to not be effective and he has high pill burden. Keep trospium for now. If significant change off these medications we can resume or if problems with passing urine we will resume.
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
## 2020-05-30 NOTE — Assessment & Plan Note (Signed)
Weight is stable overall. He does have muscle wasting in the last several months since home PT has stopped. Checking labs today and if normal will start remeron 15 mg qhs to see if this can help stimulate appetite.

## 2020-05-30 NOTE — Assessment & Plan Note (Signed)
Checking labs for any change in renal function, B12 and vitamin D deficiencies, thyroid dysfunction, CBC to rule out anemia. It seems likely that his severe health problems and changes in the last year are likely the cause of his decline in health. 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 He has recovered some since this time but overall has had significant decline.

## 2020-06-02 ENCOUNTER — Other Ambulatory Visit: Payer: Self-pay | Admitting: Cardiology

## 2020-06-02 DIAGNOSIS — I119 Hypertensive heart disease without heart failure: Secondary | ICD-10-CM

## 2020-06-03 ENCOUNTER — Ambulatory Visit (INDEPENDENT_AMBULATORY_CARE_PROVIDER_SITE_OTHER): Payer: Medicare Other | Admitting: Pharmacist

## 2020-06-03 DIAGNOSIS — N184 Chronic kidney disease, stage 4 (severe): Secondary | ICD-10-CM | POA: Diagnosis not present

## 2020-06-03 DIAGNOSIS — I10 Essential (primary) hypertension: Secondary | ICD-10-CM | POA: Diagnosis not present

## 2020-06-03 NOTE — Progress Notes (Signed)
Contacted patient to discuss furosemide issues.   Patient's wife Christian [name] manages his medications. She reports she spoke with [facility] and was told furosemide would be over $100 to refill. Furosemide is a preferred Tier 1 drug so this should not be the case.  Per pharmacy records furosemide 40 mg was filled on 05/08/20 for a 90 day supply (#360 tablets) after recent dose increase to 80 mg twice a day via nephrology. Patient's wife tried to refill furosemide on 05/30/20. She reports she does not have the bottle of furosemide from 05/08/20 even after looking through all of the medication bottles she has. [facility] had quoted the cash price for #360 tablets when patient had tried to refill the medication since insurance will not cover it again so soon after filling it.  Patient was able to get a 30-day supply for ~$35 (cash price without insurance) on 05/31/20 and the pharmacist told her they would be able to refill the furosemide in early March through the insurance.  Patient voiced understanding of above and denied further questions.

## 2020-06-14 DIAGNOSIS — Z992 Dependence on renal dialysis: Secondary | ICD-10-CM | POA: Diagnosis not present

## 2020-06-14 DIAGNOSIS — I214 Non-ST elevation (NSTEMI) myocardial infarction: Secondary | ICD-10-CM | POA: Diagnosis not present

## 2020-06-14 DIAGNOSIS — N186 End stage renal disease: Secondary | ICD-10-CM | POA: Diagnosis not present

## 2020-06-14 DIAGNOSIS — R262 Difficulty in walking, not elsewhere classified: Secondary | ICD-10-CM | POA: Diagnosis not present

## 2020-06-16 ENCOUNTER — Other Ambulatory Visit: Payer: Self-pay | Admitting: Internal Medicine

## 2020-06-18 ENCOUNTER — Encounter: Payer: Self-pay | Admitting: Cardiology

## 2020-06-18 DIAGNOSIS — Z931 Gastrostomy status: Secondary | ICD-10-CM | POA: Diagnosis not present

## 2020-06-18 DIAGNOSIS — I509 Heart failure, unspecified: Secondary | ICD-10-CM | POA: Diagnosis not present

## 2020-06-18 DIAGNOSIS — Z951 Presence of aortocoronary bypass graft: Secondary | ICD-10-CM | POA: Diagnosis not present

## 2020-06-21 ENCOUNTER — Telehealth (HOSPITAL_COMMUNITY): Payer: Self-pay | Admitting: Pharmacist

## 2020-06-22 ENCOUNTER — Other Ambulatory Visit: Payer: Self-pay | Admitting: Internal Medicine

## 2020-06-25 ENCOUNTER — Encounter (HOSPITAL_COMMUNITY)
Admission: RE | Admit: 2020-06-25 | Discharge: 2020-06-25 | Disposition: A | Discharge status: Home / Self Care | Payer: Medicare Other | Source: Ambulatory Visit | Attending: Cardiology | Admitting: Cardiology

## 2020-06-25 ENCOUNTER — Other Ambulatory Visit: Payer: Self-pay

## 2020-06-25 ENCOUNTER — Encounter (HOSPITAL_COMMUNITY): Payer: Self-pay

## 2020-06-25 VITALS — BP 144/84 | HR 87 | Ht 67.0 in | Wt 199.7 lb

## 2020-06-25 DIAGNOSIS — Z7901 Long term (current) use of anticoagulants: Secondary | ICD-10-CM | POA: Insufficient documentation

## 2020-06-25 DIAGNOSIS — Z79899 Other long term (current) drug therapy: Secondary | ICD-10-CM | POA: Insufficient documentation

## 2020-06-25 DIAGNOSIS — I214 Non-ST elevation (NSTEMI) myocardial infarction: Secondary | ICD-10-CM | POA: Insufficient documentation

## 2020-06-25 DIAGNOSIS — Z7902 Long term (current) use of antithrombotics/antiplatelets: Secondary | ICD-10-CM | POA: Insufficient documentation

## 2020-06-25 DIAGNOSIS — Z7982 Long term (current) use of aspirin: Secondary | ICD-10-CM | POA: Insufficient documentation

## 2020-06-25 DIAGNOSIS — Z87891 Personal history of nicotine dependence: Secondary | ICD-10-CM | POA: Insufficient documentation

## 2020-06-25 DIAGNOSIS — Z951 Presence of aortocoronary bypass graft: Secondary | ICD-10-CM | POA: Insufficient documentation

## 2020-06-25 NOTE — Progress Notes (Signed)
Cardiac Rehab Medication Review by a Nurse  Does the patient  feel that his/her medications are working for him/her?  yes  Has the patient been experiencing any side effects to the medications prescribed?  no  Does the patient measure his/her own blood pressure or blood glucose at home?  yes   Does the patient have any problems obtaining medications due to transportation or finances?   no  Understanding of regimen: poor Understanding of indications: poor Potential of compliance: good    Nurse comments: Reviewed medications with the patient and his wife. Estil says that he does not understand all of his medications and why he takes them. Royden says he has a blood pressure monitor his blood pressures sometime are high. Will forward today's vital sign to Dr Irven Shelling office for review. Today's BP's were somewhat elevated. Exit BP 148/86.    Harrell Gave RN BSN 06/25/2020 2:47 PM

## 2020-06-25 NOTE — Progress Notes (Signed)
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  silodosin (RAPAFLO) 4 MG CAPS capsule, Take 4 mg by mouth at bedtime., Disp: , Rfl:  .  aspirin EC 81 MG tablet, Take 81 mg by mouth daily., Disp: , Rfl:  .  atorvastatin (LIPITOR) 20 MG tablet, TAKE 1 TABLET BY MOUTH EVERY DAY IN THE EVENING (Patient taking differently: Take 20 mg by mouth every evening.), Disp: 90 tablet, Rfl: 3 .  clonazePAM (KLONOPIN) 0.5 MG disintegrating tablet, TAKE 1 TABLET BY MOUTH TWICE A DAY, Disp: 60 tablet, Rfl: 5 .  clopidogrel (PLAVIX) 75 MG tablet, Take 1 tablet (75 mg total) by mouth daily., Disp: 90 tablet, Rfl: 3 .  cromolyn (OPTICROM) 4 % ophthalmic solution, SMARTSIG:1 Drop(s) In Eye(s) Every 6-12 Hours PRN, Disp: , Rfl:  .  [employer] ACETAMINOPHEN 325 MG tablet, TAKE 2 CAPS BY MOUTH EVERY 6 HOURS AS NEEDED FOR PAIN FOR TEMP 100F OR ABOVE, Disp: 60 tablet, Rfl: 0 .  [employer] SENNA PLUS 8.6-50 MG tablet, TAKE 2 TABLETS BY MOUTH AT BEDTIME FOR CONSTIPATION, Disp: 60 tablet, Rfl: 1 .  ferrous sulfate 325 (65 FE) MG tablet, Take 325 mg by mouth daily with breakfast., Disp: , Rfl:  .  furosemide (LASIX) 80 MG tablet, Take 80 mg by mouth 2 (two) times daily. Dose changed by nephrologist 05/09/20, Disp: , Rfl:  .  hydrALAZINE (APRESOLINE) 25 MG tablet, Take 1 tablet (25 mg total) by mouth 3 (three) times daily., Disp: 270 tablet, Rfl: 3 .  isosorbide dinitrate (ISORDIL) 30 MG tablet, TAKE 1 TABLET BY  MOUTH 3 TIMES DAILY., Disp: 270 tablet, Rfl: 1 .  metoprolol succinate (TOPROL-XL) 100 MG 24 hr tablet, Take 100 mg by mouth daily. Take with or immediately following a meal., Disp: , Rfl:  .  mirtazapine (REMERON) 15 MG tablet, TAKE 1 TABLET BY MOUTH EVERYDAY AT BEDTIME, Disp: 90 tablet, Rfl: 0 .  multivitamin (RENA-VIT) TABS tablet, Take 1 tablet by mouth at bedtime., Disp: , Rfl: 0 .  nitroGLYCERIN (NITROSTAT) 0.4 MG SL tablet, Place 0.4 mg under the tongue as directed., Disp: , Rfl:  .  oxycodone (OXY-IR) 5 MG capsule, Take 5 mg by mouth every 4 (four) hours as needed for pain., Disp: , Rfl:  .  PREVIDENT 5000 BOOSTER PLUS 1.1 % PSTE, Take 1 application by mouth daily. , Disp: , Rfl:  .  sertraline (ZOLOFT) 25 MG tablet, TAKE 1 TABLET ONE TIME A DAY, Disp: 90 tablet, Rfl: 1 .  Trospium Chloride 60 MG CP24, Take 1 capsule by mouth daily. (Patient not taking: Reported on 06/25/2020), Disp: , Rfl:  .  Vitamin D, Ergocalciferol, (DRISDOL) 1.25 MG (50000 UNIT) CAPS capsule, Take 50,000 Units by mouth every 7 (seven) days., Disp: , Rfl:   Past Medical History: Past Medical History:  Diagnosis Date  . BPH (benign prostatic hypertrophy)   . Coronary artery disease   . Diabetes mellitus without complication ([place])   .  Dry eyes left  . Hiatal hernia   . 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  LIMA TO LAD, RIMA TO PDA, RADIAL ARTERY TO CIRC AND SEQUENTIALLY TO OM1. 08/15/2019  . Hyperlipidemia   . Hypertension   . Incomplete bladder emptying   . Nocturia   . 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 SOB (shortness of breath) on exertion     Tobacco Use: Social History   Tobacco Use  Smoking Status Former Smoker  . Packs/day: 0.50  . Years: 20.00  . Pack years: 10.00  . Types: Cigarettes  . Quit date: 23  . Years since quitting: 47.1  Smokeless Tobacco  Never Used    Labs: Recent Chemical engineer    Labs for ITP Cardiac and Pulmonary Rehab Latest Ref Rng & Units 09/01/2019 09/01/2019 09/07/2019 12/20/2019 03/25/2020   Cholestrol 0 - 200 mg/dL - - - - -   LDLCALC 0 - 99 mg/dL - - - - -   LDLDIRECT mg/dL - - - - -   HDL >40 mg/dL - - - - -   Trlycerides <150 mg/dL - - - - -   Hemoglobin A1c 4.6 - 6.5 % - - - 4.9 5.4   PHART 7.350 - 7.450 - - 7.481(H) - -   PCO2ART 32.0 - 48.0 mmHg - - 31.4(L) - -   HCO3 20.0 - 28.0 mmol/L - - 23.3 - -   TCO2 22 - 32 mmol/L - - 24 - -   ACIDBASEDEF 0.0 - 2.0 mmol/L - - - - -   O2SAT % 39.4 55.6 99.0 - -      Capillary Blood Glucose: Lab Results  Component Value Date   GLUCAP 200 (H) 10/18/2019   GLUCAP 276 (H) 10/18/2019   GLUCAP 145 (H) 10/18/2019   GLUCAP 162 (H) 10/18/2019   GLUCAP 166 (H) 10/17/2019     Exercise Target Goals: Exercise Program Goal: Individual exercise prescription set using results from initial 6 min walk test and THRR while considering  patient's activity barriers and safety.   Exercise Prescription Goal: Starting with aerobic activity 30 plus minutes a day, 3 days per week for initial exercise prescription. Provide home exercise prescription and guidelines that participant acknowledges understanding prior to discharge.  Activity Barriers & Risk Stratification:  Activity Barriers & Cardiac Risk Stratification - 06/25/20 1339      Activity Barriers & Cardiac Risk Stratification   Activity Barriers Back Problems;Deconditioning;Muscular Weakness;Shortness of Breath;Balance Concerns;History of Falls;Assistive Device    Cardiac Risk Stratification High           6 Minute Walk:  6 Minute Walk    Row Name 06/25/20 1337         6 Minute Walk   Phase Initial     Distance 680 feet     Walk Time 6 minutes     # of Rest Breaks 0     MPH 1.29     METS 1.92     RPE 9     Perceived Dyspnea  3     VO2 Peak 6.71     Symptoms Yes (comment)     Comments SOB, RPD = 3      Resting HR 99 bpm     Resting BP 144/84     Resting Oxygen Saturation  97 %  Exercise Oxygen Saturation  during 6 min walk 96 %     Max Ex. HR 115 bpm     Max Ex. BP 170/94     2 Minute Post BP 158/96            Oxygen Initial Assessment:   Oxygen Re-Evaluation:   Oxygen Discharge (Final Oxygen Re-Evaluation):   Initial Exercise Prescription:  Initial Exercise Prescription - 06/25/20 1300      Date of Initial Exercise RX and Referring Provider   Date 06/25/20    Referring Provider Adrian Prows, MD    Expected Discharge Date 08/23/20      NuStep   Level 1    SPM 75    Minutes 25    METs 1.8      Prescription Details   Frequency (times per week) 3    Duration Progress to 30 minutes of continuous aerobic without signs/symptoms of physical distress      Intensity   THRR 40-80% of Max Heartrate 59-118    Ratings of Perceived Exertion 11-13    Perceived Dyspnea 0-4      Progression   Progression Continue progressive overload as per policy without signs/symptoms or physical distress.      Resistance Training   Training Prescription Yes    Weight 2 lbs    Reps 10-15           Perform Capillary Blood Glucose checks as needed.  Exercise Prescription Changes:   Exercise Comments:   Exercise Goals and Review:   Exercise Goals    Row Name 06/25/20 1344             Exercise Goals   Increase Physical Activity Yes       Intervention Provide advice, education, support and counseling about physical activity/exercise needs.;Develop an individualized exercise prescription for aerobic and resistive training based on initial evaluation findings, risk stratification, comorbidities and participant's personal goals.       Expected Outcomes Short Term: Attend rehab on a regular basis to increase amount of physical activity.;Long Term: Add in home exercise to make exercise part of routine and to increase amount of physical activity.;Long Term: Exercising regularly  at least 3-5 days a week.       Increase Strength and Stamina Yes       Intervention Provide advice, education, support and counseling about physical activity/exercise needs.;Develop an individualized exercise prescription for aerobic and resistive training based on initial evaluation findings, risk stratification, comorbidities and participant's personal goals.       Expected Outcomes Short Term: Increase workloads from initial exercise prescription for resistance, speed, and METs.;Short Term: Perform resistance training exercises routinely during rehab and add in resistance training at home;Long Term: Improve cardiorespiratory fitness, muscular endurance and strength as measured by increased METs and functional capacity (6MWT)       Able to understand and use rate of perceived exertion (RPE) scale Yes       Intervention Provide education and explanation on how to use RPE scale       Expected Outcomes Short Term: Able to use RPE daily in rehab to express subjective intensity level;Long Term:  Able to use RPE to guide intensity level when exercising independently       Able to understand and use Dyspnea scale Yes       Intervention Provide education and explanation on how to use Dyspnea scale       Expected Outcomes Short Term: Able to use Dyspnea scale daily  in rehab to express subjective sense of shortness of breath during exertion;Long Term: Able to use Dyspnea scale to guide intensity level when exercising independently       Knowledge and understanding of Target Heart Rate Range (THRR) Yes       Intervention Provide education and explanation of THRR including how the numbers were predicted and where they are located for reference       Expected Outcomes Short Term: Able to state/look up THRR;Short Term: Able to use daily as guideline for intensity in rehab;Long Term: Able to use THRR to govern intensity when exercising independently       Understanding of Exercise Prescription Yes        Intervention Provide education, explanation, and written materials on patient's individual exercise prescription       Expected Outcomes Short Term: Able to explain program exercise prescription;Long Term: Able to explain home exercise prescription to exercise independently              Exercise Goals Re-Evaluation :    Discharge Exercise Prescription (Final Exercise Prescription Changes):   Nutrition:  Target Goals: Understanding of nutrition guidelines, daily intake of sodium 1500mg , cholesterol 200mg , calories 30% from fat and 7% or less from saturated fats, daily to have 5 or more servings of fruits and vegetables.  Biometrics:  Pre Biometrics - 06/25/20 1045      Pre Biometrics   Waist Circumference 44 inches    Hip Circumference 46 inches    Waist to Hip Ratio 0.96 %    Triceps Skinfold 20 mm    % Body Fat 66 %    Grip Strength 27 kg    Flexibility 0 in   could not do   Single Leg Stand --   not done. H/O falls, assistive decvie.           Nutrition Therapy Plan and Nutrition Goals:   Nutrition Assessments:  MEDIFICTS Score Key:  ?70 Need to make dietary changes   40-70 Heart Healthy Diet  ? 40 Therapeutic Level Cholesterol Diet   Picture Your Plate Scores:  <29 Unhealthy dietary pattern with much room for improvement.  41-50 Dietary pattern unlikely to meet recommendations for good health and room for improvement.  51-60 More healthful dietary pattern, with some room for improvement.   >60 Healthy dietary pattern, although there may be some specific behaviors that could be improved.    Nutrition Goals Re-Evaluation:   Nutrition Goals Discharge (Final Nutrition Goals Re-Evaluation):   Psychosocial: Target Goals: Acknowledge presence or absence of significant depression and/or stress, maximize coping skills, provide positive support system. Participant is able to verbalize types and ability to use techniques and skills needed for reducing  stress and depression.  Initial Review & Psychosocial Screening:  Initial Psych Review & Screening - 06/25/20 1428      Initial Review   Current issues with Current Stress Concerns;Current Anxiety/Panic    Source of Stress Concerns Family;Chronic Illness    Comments Berkley is recovering from a lengthy hospitalization and recovery. Robertlee has anxiety that is controlled.      Family Dynamics   Good Support System? Yes   Issac has his wife for support.     Barriers   Psychosocial barriers to participate in program The patient should benefit from training in stress management and relaxation.      Screening Interventions   Interventions To provide support and resources with identified psychosocial needs    Expected Outcomes Long Term Goal:  Stressors or current issues are controlled or eliminated.;Short Term goal: Identification and review with participant of any Quality of Life or Depression concerns found by scoring the questionnaire.           Quality of Life Scores:  Quality of Life - 06/25/20 1333      Quality of Life   Select Quality of Life      Quality of Life Scores   Health/Function Pre 14.89 %    Socioeconomic Pre 12.7 %    Psych/Spiritual Pre 16.93 %    Family Pre 14 %    GLOBAL Pre 14.85 %          Scores of 19 and below usually indicate a poorer quality of life in these areas.  A difference of  2-3 points is a clinically meaningful difference.  A difference of 2-3 points in the total score of the Quality of Life Index has been associated with significant improvement in overall quality of life, self-image, physical symptoms, and general health in studies assessing change in quality of life.  PHQ-9: Recent Review Flowsheet Data    Depression screen [facility] 2/9 06/25/2020 06/25/2020 05/28/2020 11/23/2018 10/18/2017   Decreased Interest 0 0 0 0 0   Down, Depressed, Hopeless 0 0 0 0 0   PHQ - 2 Score 0 0 0 0 0     Interpretation of Total Score  Total Score Depression Severity:   1-4 = Minimal depression, 5-9 = Mild depression, 10-14 = Moderate depression, 15-19 = Moderately severe depression, 20-27 = Severe depression   Psychosocial Evaluation and Intervention:   Psychosocial Re-Evaluation:   Psychosocial Discharge (Final Psychosocial Re-Evaluation):   Vocational Rehabilitation: Provide vocational rehab assistance to qualifying candidates.   Vocational Rehab Evaluation & Intervention:  Vocational Rehab - 06/25/20 1438      Initial Vocational Rehab Evaluation & Intervention   Assessment shows need for Vocational Rehabilitation No   Kaylor is retired and does not need vocatinal rehab at this time.          Education: Education Goals: Education classes will be provided on a weekly basis, covering required topics. Participant will state understanding/return demonstration of topics presented.  Learning Barriers/Preferences:  Learning Barriers/Preferences - 06/25/20 1334      Learning Barriers/Preferences   Learning Barriers Sight   wears glasses   Learning Preferences Audio;Computer/Internet;Group Instruction;Pictoral;Skilled Demonstration;Individual Instruction;Verbal Instruction;Video;Written Material           Education Topics: Hypertension, Hypertension Reduction -Define heart disease and high blood pressure. Discus how high blood pressure affects the body and ways to reduce high blood pressure.   Exercise and Your Heart -Discuss why it is important to exercise, the FITT principles of exercise, normal and abnormal responses to exercise, and how to exercise safely.   Angina -Discuss definition of angina, causes of angina, treatment of angina, and how to decrease risk of having angina.   Cardiac Medications -Review what the following cardiac medications are used for, how they affect the body, and side effects that may occur when taking the medications.  Medications include Aspirin, Beta blockers, calcium channel blockers, ACE Inhibitors,  angiotensin receptor blockers, diuretics, digoxin, and antihyperlipidemics.   Congestive Heart Failure -Discuss the definition of CHF, how to live with CHF, the signs and symptoms of CHF, and how keep track of weight and sodium intake.   Heart Disease and Intimacy -Discus the effect sexual activity has on the heart, how changes occur during intimacy as we age, and safety during sexual  activity.   Smoking Cessation / COPD -Discuss different methods to quit smoking, the health benefits of quitting smoking, and the definition of COPD.   Nutrition I: Fats -Discuss the types of cholesterol, what cholesterol does to the heart, and how cholesterol levels can be controlled.   Nutrition II: Labels -Discuss the different components of food labels and how to read food label   Heart Parts/Heart Disease and PAD -Discuss the anatomy of the heart, the pathway of blood circulation through the heart, and these are affected by heart disease.   Stress I: Signs and Symptoms -Discuss the causes of stress, how stress may lead to anxiety and depression, and ways to limit stress.   Stress II: Relaxation -Discuss different types of relaxation techniques to limit stress.   Warning Signs of Stroke / TIA -Discuss definition of a stroke, what the signs and symptoms are of a stroke, and how to identify when someone is having stroke.   Knowledge Questionnaire Score:  Knowledge Questionnaire Score - 06/25/20 1336      Knowledge Questionnaire Score   Pre Score 15/24           Core Components/Risk Factors/Patient Goals at Admission:  Personal Goals and Risk Factors at Admission - 06/25/20 1336      Core Components/Risk Factors/Patient Goals on Admission    Weight Management Yes;Obesity;Weight Loss    Intervention Weight Management: Develop a combined nutrition and exercise program designed to reach desired caloric intake, while maintaining appropriate intake of nutrient and fiber, sodium and  fats, and appropriate energy expenditure required for the weight goal.;Weight Management: Provide education and appropriate resources to help participant work on and attain dietary goals.;Weight Management/Obesity: Establish reasonable short term and long term weight goals.;Obesity: Provide education and appropriate resources to help participant work on and attain dietary goals.    Admit Weight 199 lb 11.8 oz (90.6 kg)    Expected Outcomes Short Term: Continue to assess and modify interventions until short term weight is achieved;Long Term: Adherence to nutrition and physical activity/exercise program aimed toward attainment of established weight goal;Weight Maintenance: Understanding of the daily nutrition guidelines, which includes 25-35% calories from fat, 7% or less cal from saturated fats, less than 200mg  cholesterol, less than 1.5gm of sodium, & 5 or more servings of fruits and vegetables daily;Weight Loss: Understanding of general recommendations for a balanced deficit meal plan, which promotes 1-2 lb weight loss per week and includes a negative energy balance of [phone] kcal/d;Understanding recommendations for meals to include 15-35% energy as protein, 25-35% energy from fat, 35-60% energy from carbohydrates, less than 200mg  of dietary cholesterol, 20-35 gm of total fiber daily;Understanding of distribution of calorie intake throughout the day with the consumption of 4-5 meals/snacks    Improve shortness of breath with ADL's Yes    Intervention Provide education, individualized exercise plan and daily activity instruction to help decrease symptoms of SOB with activities of daily living.    Expected Outcomes Short Term: Improve cardiorespiratory fitness to achieve a reduction of symptoms when performing ADLs;Long Term: Be able to perform more ADLs without symptoms or delay the onset of symptoms    Diabetes Yes    Intervention Provide education about signs/symptoms and action to take for  hypo/hyperglycemia.;Provide education about proper nutrition, including hydration, and aerobic/resistive exercise prescription along with prescribed medications to achieve blood glucose in normal ranges: Fasting glucose 65-99 mg/dL    Expected Outcomes Short Term: Participant verbalizes understanding of the signs/symptoms and immediate care of hyper/hypoglycemia, proper foot  care and importance of medication, aerobic/resistive exercise and nutrition plan for blood glucose control.;Long Term: Attainment of HbA1C < 7%.    Hypertension Yes    Intervention Provide education on lifestyle modifcations including regular physical activity/exercise, weight management, moderate sodium restriction and increased consumption of fresh fruit, vegetables, and low fat dairy, alcohol moderation, and smoking cessation.;Monitor prescription use compliance.    Expected Outcomes Short Term: Continued assessment and intervention until BP is < 140/50mm HG in hypertensive participants. < 130/49mm HG in hypertensive participants with diabetes, heart failure or chronic kidney disease.;Long Term: Maintenance of blood pressure at goal levels.    Lipids Yes    Intervention Provide education and support for participant on nutrition & aerobic/resistive exercise along with prescribed medications to achieve LDL 70mg , HDL >40mg .    Expected Outcomes Short Term: Participant states understanding of desired cholesterol values and is compliant with medications prescribed. Participant is following exercise prescription and nutrition guidelines.;Long Term: Cholesterol controlled with medications as prescribed, with individualized exercise RX and with personalized nutrition plan. Value goals: LDL < 70mg , HDL > 40 mg.    Stress Yes    Intervention Offer individual and/or small group education and counseling on adjustment to heart disease, stress management and health-related lifestyle change. Teach and support self-help strategies.;Refer  participants experiencing significant psychosocial distress to appropriate mental health specialists for further evaluation and treatment. When possible, include family members and significant others in education/counseling sessions.    Expected Outcomes Short Term: Participant demonstrates changes in health-related behavior, relaxation and other stress management skills, ability to obtain effective social support, and compliance with psychotropic medications if prescribed.;Long Term: Emotional wellbeing is indicated by absence of clinically significant psychosocial distress or social isolation.           Core Components/Risk Factors/Patient Goals Review:    Core Components/Risk Factors/Patient Goals at Discharge (Final Review):    ITP Comments:  ITP Comments    Row Name 06/25/20 1423           ITP Comments Dr Fransico Him MD, Medical Director              Comments: Bland Span attended orientation on 06/25/2020 to review rules and guidelines for program.  Completed 6 minute walk test, Intitial ITP, and exercise prescription.Systolic BP ranged from 245 to 809 systolic to 98-33 diastolic. Telemetry-Sinus Rhythm with PAC's intermittent PVC's multifocal. . Safety measures and social distancing in place per CDC guidelines. Patient uses a rollator for stability and complained of moderate dyspnea during the walk test. This resolved with rest as the patient is deconditioned. Patient's wife was present for orientation.Barnet Pall, RN,BSN 06/25/2020 3:04 PM

## 2020-06-26 ENCOUNTER — Telehealth: Payer: Medicare Other

## 2020-06-26 ENCOUNTER — Telehealth: Payer: Self-pay

## 2020-06-26 NOTE — Telephone Encounter (Signed)
Pts rehab called stating that yesterday the pts bp was up. Before walking it was 144/84, then

## 2020-06-26 NOTE — Progress Notes (Deleted)
Chronic Care Management Pharmacy Note  06/26/2020 Name:  Christian [name] MRN:  [id number] DOB:  1945-06-22  Subjective: Christian [name] is an 75 y.o. year old male who is a primary patient of Hoyt Koch, MD.  The CCM team was consulted for assistance with disease management and care coordination needs.    Engaged with patient by telephone for follow up visit in response to provider referral for pharmacy case management and/or care coordination services.   Consent to Services:  The patient was given information about Chronic Care Management services, agreed to services, and gave verbal consent prior to initiation of services.  Please see initial visit note for detailed documentation.   Patient Care Team: Hoyt Koch, MD as PCP - General (Internal Medicine) Gardiner Barefoot, DPM as Consulting Physician (Podiatry) McKenzie, Candee Furbish, MD as Consulting Physician (Urology) Woody Seller Sylvan Cheese, MD as Referring Physician (Cardiology) Charlton Haws, [facility] as Pharmacist (Pharmacist)  Patient lives at home with wife who handles his medications. She sets up pill box for him and keeps track of changes on med list. Patient is currently getting help from home health nurse 3 days a week but this service will end soon per patient. He is interested in getting into [facility] for supervised physical therapy and exercise.  05/14/20 - patient reports feeling "bad", he is not eating, difficult to get of bed, no motivation to do anything, and trouble remembering things. He had a phone call with cardiac rehab yesterday that he did not remember today. This has been going on for 2 weeks per patient, even longer per wife.   Recent office visits: 05/28/20 Dr Sharlet Salina OV: f/u for distraction, sleep difficulty, lack of appetite. Rx'd mirtazapine 15 mg.  03/25/20 Dr Sharlet Salina OV: chronic f/u, pt recovering from complicated CABG in spring. Following w/ cardiology and nephrology. No  med changes.  Recent consult visits: 06/25/20 started cardiac rehab.  05/09/20 Dr Royce Macadamia (nephrology): increased furosmide to 80 mg BID. Referred to [facility] for AVF placement. F/u with urology for incontinence issues.  05/03/19 Dr Einar Gip (cardiology): continue same meds. Scheduled for cardiac rehab in March.  03/05/20 Dr Einar Gip (cardiology): BP 158/96.  off HD since 02/01/20. Increased metoprolol to 50 mg, added isosorbide DN 30 mg TID and hydralazine 25 mg TID.  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  Objective:  Lab Results  Component Value Date   CREATININE 3.33 (H) 05/28/2020   BUN 68 (H) 05/28/2020   GFR 17.66 (L) 05/28/2020   GFRNONAA 16 (L) 10/18/2019   GFRAA 19 (L) 10/18/2019   NA 139 05/28/2020   K 4.0 05/28/2020   CALCIUM 9.8 05/28/2020   CO2 23 05/28/2020    Lab Results  Component Value Date/Time   HGBA1C 5.4 03/25/2020 11:55 AM   HGBA1C 4.9 12/20/2019 01:12 PM   GFR 17.66 (L) 05/28/2020 03:38 PM   GFR 18.69 (L) 03/25/2020 11:55 AM    Last diabetic Eye exam:  Lab Results  Component Value Date/Time   HMDIABEYEEXA No Retinopathy 10/13/2017 12:00 AM    Last diabetic Foot exam: No results found for: HMDIABFOOTEX   Lab Results  Component Value Date   CHOL 107 08/11/2019   HDL 42 08/11/2019   LDLCALC 40 08/11/2019   LDLDIRECT 40.0 11/23/2018   TRIG 71 08/27/2019   CHOLHDL 2.5 08/11/2019    Hepatic Function  Latest Ref Rng & Units 05/28/2020 03/25/2020 12/20/2019  Total Protein 6.0 - 8.3 g/dL 8.0 - 5.7(L)  Albumin 3.5 - 5.2 g/dL 4.1 4.1 -  AST 0 - 37 U/L 32 - 21  ALT 0 - 53 U/L 28 - 10  Alk Phosphatase 39 - 117 U/L 80 - -  Total Bilirubin 0.2 - 1.2 mg/dL 0.3 - 0.4    Lab Results  Component Value Date/Time   TSH 1.24 05/28/2020 03:38 PM   TSH 2.590 04/19/2019 09:11 AM    CBC Latest Ref Rng & Units 05/28/2020 10/17/2019 10/14/2019   WBC 4.0 - 10.5 K/uL 8.0 12.3(H) 14.2(H)  Hemoglobin 13.0 - 17.0 g/dL 11.9(L) 9.2(L) 9.2(L)  Hematocrit 39.0 - 52.0 % 35.7(L) 29.1(L) 30.2(L)  Platelets 150.0 - 400.0 K/uL 367.0 346 377    Lab Results  Component Value Date/Time   VD25OH 58.52 05/28/2020 03:38 PM   VD25OH 95.53 10/19/2017 11:10 AM    Clinical ASCVD: Yes  The ASCVD Risk score Mikey Bussing DC Jr., et al., 2013) failed to calculate for the following reasons:   The patient has a prior MI or stroke diagnosis    Depression screen [facility] 2/9 06/25/2020 06/25/2020 05/28/2020  Decreased Interest 0 0 0  Down, Depressed, Hopeless 0 0 0  PHQ - 2 Score 0 0 0    BP Readings from Last 3 Encounters:  06/25/20 (!) 144/84  05/28/20 122/80  05/02/20 122/80   Pulse Readings from Last 3 Encounters:  06/25/20 87  05/28/20 72  05/02/20 80   Wt Readings from Last 3 Encounters:  06/25/20 199 lb 11.8 oz (90.6 kg)  05/28/20 177 lb 9.6 oz (80.6 kg)  05/02/20 207 lb (93.9 kg)    Assessment/Interventions: Review of patient past medical history, allergies, medications, health status, including review of consultants reports, laboratory and other test data, was performed as part of comprehensive evaluation and provision of chronic care management services.   SDOH:  (Social Determinants of Health) assessments and interventions performed:    CCM Care Plan  No Known Allergies  Medications Reviewed Today    Reviewed by Magda Kiel, RN (Registered Nurse) on 06/25/20 at 1440  Med List Status: <None>  Medication Order Taking? Sig Documenting Provider Last Dose Status Informant  aspirin EC 81 MG tablet [id number]  Take 81 mg by mouth daily. [provider]  Active Multiple Informants  atorvastatin (LIPITOR) 20 MG tablet [id number]  TAKE 1 TABLET BY MOUTH EVERY DAY IN THE EVENING  Patient taking differently: Take 20 mg by mouth every evening.   Adrian Prows, MD  Active   clonazePAM Bobbye [place]) 0.5 MG disintegrating tablet [id number]  TAKE 1  TABLET BY MOUTH TWICE A DAY Hoyt Koch, MD  Active   clopidogrel (PLAVIX) 75 MG tablet [id number]  Take 1 tablet (75 mg total) by mouth daily. Adrian Prows, MD  Active   cromolyn (OPTICROM) 4 % ophthalmic solution [id number]  SMARTSIG:1 Drop(s) In Eye(s) Every 6-12 Hours PRN [provider]  Active   [facility] ACETAMINOPHEN 325 MG tablet [id number]  TAKE 2 CAPS BY MOUTH EVERY 6 HOURS AS NEEDED FOR PAIN FOR TEMP 100F OR ABOVE Hoyt Koch, MD  Active   [facility] SENNA PLUS 8.6-50 MG tablet [id number]  TAKE 2 TABLETS BY MOUTH AT BEDTIME FOR CONSTIPATION Hoyt Koch, MD  Active   ferrous sulfate 325 (65 FE) MG tablet [id number]  Take 325 mg by mouth daily with breakfast. [provider]  Active  Med Note (CRUZ, RICARDO K   Wed Apr 24, 2020  2:09 PM)    furosemide (LASIX) 80 MG tablet [id number]  Take 80 mg by mouth 2 (two) times daily. Dose changed by nephrologist 05/09/20 [provider]  Active   hydrALAZINE (APRESOLINE) 25 MG tablet [id number]  Take 1 tablet (25 mg total) by mouth 3 (three) times daily. Adrian Prows, MD  Active   isosorbide dinitrate (ISORDIL) 30 MG tablet [id number]  TAKE 1 TABLET BY MOUTH 3 TIMES DAILY. Adrian Prows, MD  Active   metoprolol succinate (TOPROL-XL) 100 MG 24 hr tablet [id number]  Take 100 mg by mouth daily. Take with or immediately following a meal. [provider]  Active   mirtazapine (REMERON) 15 MG tablet [id number]  TAKE 1 TABLET BY MOUTH EVERYDAY AT BEDTIME Hoyt Koch, MD  Active   multivitamin (RENA-VIT) TABS tablet [id number]  Take 1 tablet by mouth at bedtime. Barrett, Erin R, PA-C  Active   nitroGLYCERIN (NITROSTAT) 0.4 MG SL tablet [id number]  Place 0.4 mg under the tongue as directed. [provider]  Active   oxycodone (OXY-IR) 5 MG capsule [id number]  Take 5 mg by mouth every 4 (four) hours as needed for pain. [provider]  Active            Med Note Charlton Haws   Fri  Apr 12, 2020  2:12 PM) Pt uses very infrequently, every few weeks  PREVIDENT 5000 BOOSTER PLUS 1.1 % PSTE [id number]  Take 1 application by mouth daily.  [provider]  Active Multiple Informants  sertraline (ZOLOFT) 25 MG tablet [id number]  TAKE 1 TABLET ONE TIME A DAY Hoyt Koch, MD  Active   Trospium Chloride 60 MG CP24 [id number]  Take 1 capsule by mouth daily. [provider]  Active Multiple Informants  Vitamin D, Ergocalciferol, (DRISDOL) 1.25 MG (50000 UNIT) CAPS capsule [id number]  Take 50,000 Units by mouth every 7 (seven) days. [provider]  Active           Patient Active Problem List   Diagnosis Date Noted  . Sleep difficulties 05/30/2020  . Poor appetite 05/30/2020  . Anemia due to blood loss 04/24/2020  . CKD (chronic kidney disease) stage 4, GFR 15-29 ml/min (HCC) 03/27/2020  . Aortic atherosclerosis ([place]) 03/27/2020  . 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  . Pruritus, unspecified 11/28/2019  . Unspecified protein-calorie malnutrition ([place]) 10/27/2019  . Secondary hyperparathyroidism of renal origin ([place]) 10/17/2019  . Coronary artery disease 08/15/2019  . 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  LIMA TO LAD, RIMA TO PDA, RADIAL ARTERY TO CIRC AND SEQUENTIALLY TO OM1. 08/15/2019  . Ischemic cardiomyopathy   . Swelling of lower extremity   . History of coronary artery stent placement   . Pulmonary embolism ([place]) 08/09/2019  . Elevated liver enzymes 08/09/2019  . Left leg weakness 11/23/2018  . CAD (coronary artery disease) 07/30/2018  . Other fatigue 10/19/2017  . Routine general medical examination at a health care facility 08/30/2016  . Diabetes mellitus with polyneuropathy ([place]) 10/10/2014  . Benign prostatic hyperplasia 09/27/2014  . Stable angina pectoris ([place]) 02/27/2014  . S/P PTCA (percutaneous transluminal coronary angioplasty) 02/27/2014  . Hyperlipidemia associated with type 2  diabetes mellitus ([place]) 11/12/2009  . MULTIPLE SCLEROSIS 11/12/2009  . HTN (hypertension) 11/12/2009    Immunization History  Administered Date(s) Administered  . Fluad Quad(high Dose 65+) 01/09/2019  . Influenza,  High Dose Seasonal PF 02/08/2017, 01/17/2018, 01/30/2020  . Influenza,inj,Quad PF,6+ Mos 02/28/2014, 02/11/2015  . Influenza-Unspecified 03/11/2017  . PFIZER(Purple Top)SARS-COV-2 Vaccination 06/01/2019, 06/22/2019, 01/18/2020  . Pneumococcal Conjugate-13 08/13/2015, 01/09/2019  . Pneumococcal Polysaccharide-23 02/28/2014  . Tdap 08/13/2015    Conditions to be addressed/monitored:  CAD, HTN, HLD and CKD Stage 4  Patient Care Plan: CCM Pharmacy Care Plan    Problem Identified: HTN, HLD, CAD, DM, CKD IV   Priority: High    Goal: Patient-Specific Goal   Start Date: 05/24/2020  Expected End Date: 11/21/2020  This Visit's Progress: On track  Priority: High  Note:   Current Barriers:  . Unable to independently monitor therapeutic efficacy . Unable to self administer medications as prescribed  Pharmacist Clinical Goal(s):  Marland Kitchen Over the next 30 days, patient will achieve adherence to monitoring guidelines and medication adherence to achieve therapeutic efficacy through collaboration with PharmD and provider.   Interventions: . 1:1 collaboration with Hoyt Koch, MD regarding development and update of comprehensive plan of care as evidenced by provider attestation and co-signature . Inter-disciplinary care team collaboration (see longitudinal plan of care) . Comprehensive medication review performed; medication list updated in electronic medical record  Hypertension (BP goal <130/80) -controlled - improved after increasing metoprolol to 100 mg last visit -Current treatment: . Metoprolol succinate 100 mg daily . Furosemide 80 mg BID . Hydralazine 25 mg TID . Isosorbide DN 30 mg TID -Current home readings: not available -Current dietary habits: pt reports poor  appetite, not eating much for last 2 weeks -Current exercise habits: limited - "laying around all day" -Denies hypotensive/hypertensive symptoms -Educated on BP goals and benefits of medications for prevention of heart attack, stroke and kidney damage; Importance of home blood pressure monitoring; -Counseled to monitor BP at home daily, document, and provide log at future appointments -Recommended to continue current medication  Hyperlipidemia/ CAD: (LDL goal < 70) -controlled -Hx CAD. CABG 10/6193 w/ complicated post-op including cardiac arrest and acute renal failure requiring HD. -Current treatment: . Atorvastatin 20 mg daily . Clopidogrel 75 mg daily . Aspirin 81 mg daily . Isosorbide DN 30 mg TID . Nitroglycerin 0.4 mg SL prn -Educated on Cholesterol goals;  Benefits of statin for ASCVD risk reduction; -Recommended to continue current medication  Diabetes (A1c goal <7%) -controlled - diet only -Current medications: . No medications -Educated onA1c and blood sugar goals; Complications of diabetes including kidney damage, retinal damage, and cardiovascular disease; -Counseled to check feet daily and get yearly eye exams -Counseled on diet and exercise extensively  Health Maintenance -pt reports unable to eat, unable to get out of bed, trouble remembering things, feels "bad" generally -discussed this may be depression or something physiological; advised he see PCP at earliest conveneince -Scheduled PCP appt for 2/1 @ 1:40 pm   Patient Goals/Self-Care Activities . Over the next 30 days, patient will:  - take medications as prescribed see PCP next week to address new issues with fatigue, poor appetite, general malaise  Follow Up Plan: Telephone follow up appointment with care management team member scheduled for: 1 month    Depression/Anxiety (Goal: manage symptoms) -{US controlled/uncontrolled:25276} - started mirtazapine for lack of appetite, motivation, sleep  issues -Current treatment: . Mirtazapine 15 mg -Medications previously tried/failed: none -PHQ9: 0 (06/2020) -GAD7: not on file -Connected with PCP for mental health support -Educated on {CCM mental health counseling:25127} -{CCMPHARMDINTERVENTION:25122}  Medication Assistance: None required.  Patient affirms current coverage meets needs.  [facility]:  [facility]  [place] [place] [address] [place] [address] Phone: [phone] Fax: [phone]  Uses pill box? Yes - set up by wife Pt endorses 100% compliance  We discussed: Current pharmacy is preferred with insurance plan and patient is satisfied with pharmacy services  Plan: Continue current medication management strategy    Follow Up:  Patient agrees to Care Plan and Follow-up.  Plan: Telephone follow up appointment with care management team member scheduled for:  1 month  Charlene Brooke, PharmD, [facility] Clinical Pharmacist [facility] [phone]

## 2020-06-26 NOTE — Telephone Encounter (Signed)
This is related to his weight gain and is probably not following the dietary restrictions that he was recommended.  Please set up an office visit with both his PCP and see either Celeste or me for the next 2 to 3 weeks.

## 2020-06-26 NOTE — Telephone Encounter (Signed)
Pts rehab called stating that the patient had high BP yesterday. They had him walking and he started off at 144/84. Then it went to 170/94. And then to 158/96. He starts exercising Monday bu they wanted to make you aware.

## 2020-06-27 ENCOUNTER — Other Ambulatory Visit: Payer: Self-pay

## 2020-06-27 NOTE — Telephone Encounter (Signed)
Called and spoke with pt, Christian [name] is setting up an appointment for him now.

## 2020-06-28 ENCOUNTER — Encounter: Payer: Self-pay | Admitting: Internal Medicine

## 2020-06-28 ENCOUNTER — Ambulatory Visit (INDEPENDENT_AMBULATORY_CARE_PROVIDER_SITE_OTHER): Payer: Medicare Other | Admitting: Internal Medicine

## 2020-06-28 DIAGNOSIS — R350 Frequency of micturition: Secondary | ICD-10-CM

## 2020-06-28 DIAGNOSIS — N401 Enlarged prostate with lower urinary tract symptoms: Secondary | ICD-10-CM

## 2020-06-28 DIAGNOSIS — R630 Anorexia: Secondary | ICD-10-CM | POA: Diagnosis not present

## 2020-06-28 DIAGNOSIS — I10 Essential (primary) hypertension: Secondary | ICD-10-CM

## 2020-06-28 DIAGNOSIS — R5383 Other fatigue: Secondary | ICD-10-CM | POA: Diagnosis not present

## 2020-06-28 DIAGNOSIS — N184 Chronic kidney disease, stage 4 (severe): Secondary | ICD-10-CM | POA: Diagnosis not present

## 2020-06-28 MED ORDER — VITAMIN D (ERGOCALCIFEROL) 1.25 MG (50000 UNIT) PO CAPS: 50000.0000 [IU] | ORAL_CAPSULE | ORAL | 3 refills | Status: DC

## 2020-06-28 NOTE — Assessment & Plan Note (Signed)
Remeron 15 mg daily seems to have been adequate and perhaps too much to help appetite. We will stop this to see if we can lessen his appetite as he has gained 25 pounds in the last month due to eating a lot.

## 2020-06-28 NOTE — Assessment & Plan Note (Signed)
He seems to have done well with medication d/c without significant change in symptoms. Keep trospium for now although we talked again about how the lasix will make him urinate and likely BPH medications may not help this.

## 2020-06-28 NOTE — Patient Instructions (Addendum)
We will have you stop taking the remeron.   You should notice the difference in a couple of weeks. If you notice he is doing worse again we can start it back at a lower dose.  You need to work on 2000 mg sodium per day only.    [email or web address]">  DASH Eating Plan DASH stands for Dietary Approaches to Stop Hypertension. The DASH eating plan is a healthy eating plan that has been shown to:  Reduce high blood pressure (hypertension).  Reduce your risk for type 2 diabetes, heart disease, and stroke.  Help with weight loss. What are tips for following this plan? Reading food labels  Check food labels for the amount of salt (sodium) per serving. Choose foods with less than 5 percent of the Daily Value of sodium. Generally, foods with less than 300 milligrams (mg) of sodium per serving fit into this eating plan.  To find whole grains, look for the word "whole" as the first word in the ingredient list. Shopping  Buy products labeled as "low-sodium" or "no salt added."  Buy fresh foods. Avoid canned foods and pre-made or frozen meals. Cooking  Avoid adding salt when cooking. Use salt-free seasonings or herbs instead of table salt or sea salt. Check with your health care provider or pharmacist before using salt substitutes.  Do not fry foods. Cook foods using healthy methods such as baking, boiling, grilling, roasting, and broiling instead.  Cook with heart-healthy oils, such as olive, canola, avocado, soybean, or sunflower oil. Meal planning  Eat a balanced diet that includes: ? 4 or more servings of fruits and 4 or more servings of vegetables each day. Try to fill one-half of your plate with fruits and vegetables. ? 6-8 servings of whole grains each day. ? Less than 6 oz (170 g) of lean meat, poultry, or fish each day. A 3-oz (85-g) serving of meat is about the same size as a deck of cards. One egg equals 1 oz (28 g). ? 2-3 servings of  low-fat dairy each day. One serving is 1 cup (237 mL). ? 1 serving of nuts, seeds, or beans 5 times each week. ? 2-3 servings of heart-healthy fats. Healthy fats called omega-3 fatty acids are found in foods such as walnuts, flaxseeds, fortified milks, and eggs. These fats are also found in cold-water fish, such as sardines, salmon, and mackerel.  Limit how much you eat of: ? Canned or prepackaged foods. ? Food that is high in trans fat, such as some fried foods. ? Food that is high in saturated fat, such as fatty meat. ? Desserts and other sweets, sugary drinks, and other foods with added sugar. ? Full-fat dairy products.  Do not salt foods before eating.  Do not eat more than 4 egg yolks a week.  Try to eat at least 2 vegetarian meals a week.  Eat more home-cooked food and less restaurant, buffet, and fast food.   Lifestyle  When eating at a restaurant, ask that your food be prepared with less salt or no salt, if possible.  If you drink alcohol: ? Limit how much you use to:  0-1 drink a day for women who are not pregnant.  0-2 drinks a day for men. ? Be aware of how much alcohol is in your drink. In the [place] one drink equals one 12 oz bottle of beer (355 mL), one 5 oz glass of wine (148 mL), or one 1 oz glass of hard liquor (44 mL). General information  Avoid eating more than 2,300 mg of salt a day. If you have hypertension, you may need to reduce your sodium intake to 1,500 mg a day.  Work with your health care provider to maintain a healthy body weight or to lose weight. Ask what an ideal weight is for you.  Get at least 30 minutes of exercise that causes your heart to beat faster (aerobic exercise) most days of the week. Activities may include walking, swimming, or biking.  Work with your health care provider or dietitian to adjust your eating plan to your individual calorie needs. What foods should I eat? Fruits All fresh, dried, or frozen fruit. Canned fruit in  natural juice (without added sugar). Vegetables Fresh or frozen vegetables (raw, steamed, roasted, or grilled). Low-sodium or reduced-sodium tomato and vegetable juice. Low-sodium or reduced-sodium tomato sauce and tomato paste. Low-sodium or reduced-sodium canned vegetables. Grains Whole-grain or whole-wheat bread. Whole-grain or whole-wheat pasta. Brown rice. [place]. Bulgur. Whole-grain and low-sodium cereals. Pita bread. Low-fat, low-sodium crackers. Whole-wheat flour tortillas. Meats and other proteins Skinless chicken or [place]. Ground chicken or [place]. Pork with fat trimmed off. Fish and seafood. Egg whites. Dried beans, peas, or lentils. Unsalted nuts, nut butters, and seeds. Unsalted canned beans. Lean cuts of beef with fat trimmed off. Low-sodium, lean precooked or cured meat, such as sausages or meat loaves. Dairy Low-fat (1%) or fat-free (skim) milk. Reduced-fat, low-fat, or fat-free cheeses. Nonfat, low-sodium ricotta or cottage cheese. Low-fat or nonfat yogurt. Low-fat, low-sodium cheese. Fats and oils Soft margarine without trans fats. Vegetable oil. Reduced-fat, low-fat, or light mayonnaise and salad dressings (reduced-sodium). Canola, safflower, olive, avocado, soybean, and sunflower oils. Avocado. Seasonings and condiments Herbs. Spices. Seasoning mixes without salt. Other foods Unsalted popcorn and pretzels. Fat-free sweets. The items listed above may not be a complete list of foods and beverages you can eat. Contact a dietitian for more information. What foods should I avoid? Fruits Canned fruit in a light or heavy syrup. Fried fruit. Fruit in cream or butter sauce. Vegetables Creamed or fried vegetables. Vegetables in a cheese sauce. Regular canned vegetables (not low-sodium or reduced-sodium). Regular canned tomato sauce and paste (not low-sodium or reduced-sodium). Regular tomato and vegetable juice (not low-sodium or reduced-sodium). Angie Fava.  Olives. Grains Baked goods made with fat, such as croissants, muffins, or some breads. Dry pasta or rice meal packs. Meats and other proteins Fatty cuts of meat. Ribs. Fried meat. Berniece Salines. Bologna, salami, and other precooked or cured meats, such as sausages or meat loaves. Fat from the back of a pig (fatback). Bratwurst. Salted nuts and seeds. Canned beans with added salt. Canned or smoked fish. Whole eggs or egg yolks. Chicken or [place] with skin. Dairy Whole or 2% milk, cream, and half-and-half. Whole or full-fat cream cheese. Whole-fat or sweetened yogurt. Full-fat cheese. Nondairy creamers. Whipped toppings. Processed cheese and cheese spreads. Fats and oils Butter. Stick margarine. Lard. Shortening. Ghee. Bacon fat. Tropical oils, such as coconut, palm kernel, or palm oil. Seasonings and condiments Onion salt, garlic salt, seasoned salt, table salt, and sea salt. Worcestershire sauce. Tartar sauce. Barbecue sauce. Teriyaki sauce. Soy sauce, including reduced-sodium. Steak sauce. Canned and packaged gravies. Fish sauce. Oyster sauce. Cocktail sauce. Store-bought horseradish. Ketchup. Mustard. Meat flavorings and tenderizers. Bouillon cubes. Hot sauces. Pre-made or packaged marinades. Pre-made or packaged taco seasonings. Relishes. Regular salad dressings. Other foods Salted popcorn and pretzels. The items listed above may not be a complete list of foods and beverages you should avoid. Contact  a dietitian for more information. Where to find more information  National Heart, Lung, and Blood Institute: https://wilson-eaton.com/  American Heart Association: www.heart.org  Academy of Nutrition and Dietetics: www.eatright.Heath: www.kidney.org Summary  The DASH eating plan is a healthy eating plan that has been shown to reduce high blood pressure (hypertension). It may also reduce your risk for type 2 diabetes, heart disease, and stroke.  When on the DASH eating plan, aim to  eat more fresh fruits and vegetables, whole grains, lean proteins, low-fat dairy, and heart-healthy fats.  With the DASH eating plan, you should limit salt (sodium) intake to 2,300 mg a day. If you have hypertension, you may need to reduce your sodium intake to 1,500 mg a day.  Work with your health care provider or dietitian to adjust your eating plan to your individual calorie needs. This information is not intended to replace advice given to you by your health care provider. Make sure you discuss any questions you have with your health care provider. Document Revised: 03/17/2019 Document Reviewed: 03/17/2019 Elsevier Patient Education  2021 [employer].

## 2020-06-28 NOTE — Assessment & Plan Note (Signed)
We talked about risk of progression with high sodium intake and BP elevation and how he could easily end of on dialysis again.

## 2020-06-28 NOTE — Progress Notes (Signed)
   Subjective:   Patient ID: Christian [name], male    DOB: December 15, 1945, 75 y.o.   MRN: [id number]  HPI The patient is a 75 YO man coming in for follow up of poor appetite (since starting remeron he is having much more appetite, he is now eating like he used to which is not healthy, he is up about 30 pounds in the last month, BP running high here and at cardiac rehab, wife and patient agree he has too much appetite now) and BPH (stopped several medicines as they were not helping, he is still taking trospium, with frequent urination and sometimes incontinent if he cannot get to the restroom quickly enough, denies feeling like this is a significant change from before the medication change) and daytime sleepiness (since starting remeron he has slept better and done less daytime sleeping, this has caused him to be more awake and alert during the day).   Review of Systems  Constitutional: Positive for activity change, appetite change and unexpected weight change.  HENT: Negative.   Eyes: Negative.   Respiratory: Negative for cough, chest tightness and shortness of breath.   Cardiovascular: Negative for chest pain, palpitations and leg swelling.  Gastrointestinal: Negative for abdominal distention, abdominal pain, constipation, diarrhea, nausea and vomiting.  Musculoskeletal: Positive for arthralgias.  Skin: Negative.   Neurological: Negative.   Psychiatric/Behavioral: Negative.     Objective:  Physical Exam Constitutional:      Appearance: He is well-developed and well-nourished. He is obese.  HENT:     Head: Normocephalic and atraumatic.  Eyes:     Extraocular Movements: EOM normal.  Cardiovascular:     Rate and Rhythm: Normal rate and regular rhythm.  Pulmonary:     Effort: Pulmonary effort is normal. No respiratory distress.     Breath sounds: Normal breath sounds. No wheezing or rales.  Abdominal:     General: Bowel sounds are normal. There is no distension.     Palpations: Abdomen is  soft.     Tenderness: There is no abdominal tenderness. There is no rebound.  Musculoskeletal:        General: No edema.     Cervical back: Normal range of motion.  Skin:    General: Skin is warm and dry.  Neurological:     Mental Status: He is alert and oriented to person, place, and time.     Coordination: Coordination abnormal.     Comments: Much more alert than last visit and paid attention throughout visit  Psychiatric:        Mood and Affect: Mood and affect normal.     Vitals:   06/28/20 1032  BP: (!) 144/92  Pulse: 96  Resp: 18  Temp: 97.7 F (36.5 C)  TempSrc: Oral  SpO2: 97%  Weight: 202 lb 6.4 oz (91.8 kg)  Height: 5\' 7"  (1.702 m)    This visit occurred during the SARS-CoV-2 public health emergency.  Safety protocols were in place, including screening questions prior to the visit, additional usage of staff PPE, and extensive cleaning of exam room while observing appropriate contact time as indicated for disinfecting solutions.   Assessment & Plan:

## 2020-06-28 NOTE — Assessment & Plan Note (Signed)
His daytime sleepiness is better now that he is sleeping well. We will stop remeron because he has had excessive weight gain and cravings for foods not good for his kidneys and heart. If he seems to revert to sleeping a lot during the day and not being alert we can try a smaller dose of remeron.

## 2020-06-28 NOTE — Assessment & Plan Note (Signed)
We talked about how his BP is elevated now that he is eating everything he wants again. Discussed 2000 mg sodium goal for daily. 1 hardees double cheeseburger with bacon which he eats several times weekly is his entire sodium intake for the day and he was surprised at this information. Given information about DASH diet.

## 2020-07-01 ENCOUNTER — Other Ambulatory Visit: Payer: Self-pay

## 2020-07-01 ENCOUNTER — Encounter (HOSPITAL_COMMUNITY)
Admission: RE | Admit: 2020-07-01 | Discharge: 2020-07-01 | Disposition: A | Discharge status: Home / Self Care | Payer: Medicare Other | Source: Ambulatory Visit | Attending: Cardiology | Admitting: Cardiology

## 2020-07-01 DIAGNOSIS — Z87891 Personal history of nicotine dependence: Secondary | ICD-10-CM | POA: Diagnosis not present

## 2020-07-01 DIAGNOSIS — Z951 Presence of aortocoronary bypass graft: Secondary | ICD-10-CM

## 2020-07-01 DIAGNOSIS — I214 Non-ST elevation (NSTEMI) myocardial infarction: Secondary | ICD-10-CM | POA: Diagnosis not present

## 2020-07-01 DIAGNOSIS — Z79899 Other long term (current) drug therapy: Secondary | ICD-10-CM | POA: Diagnosis not present

## 2020-07-01 DIAGNOSIS — Z7902 Long term (current) use of antithrombotics/antiplatelets: Secondary | ICD-10-CM | POA: Diagnosis not present

## 2020-07-01 DIAGNOSIS — Z7901 Long term (current) use of anticoagulants: Secondary | ICD-10-CM | POA: Diagnosis not present

## 2020-07-01 DIAGNOSIS — Z7982 Long term (current) use of aspirin: Secondary | ICD-10-CM | POA: Diagnosis not present

## 2020-07-01 LAB — GLUCOSE, CAPILLARY
Glucose-Capillary: 136 mg/dL — ABNORMAL HIGH (ref 70–99)
Glucose-Capillary: 143 mg/dL — ABNORMAL HIGH (ref 70–99)

## 2020-07-01 NOTE — Progress Notes (Signed)
Daily Session Note  Patient Details  Name: Christian [name] MRN: [id number] Date of Birth: 10/31/1945 Referring Provider:   Flowsheet Row CARDIAC REHAB PHASE II ORIENTATION from 06/25/2020 in [facility]  Referring Provider Adrian Prows, MD      Encounter Date: 07/01/2020  Check In:  Session Check In - 07/01/20 1358      Check-In   Supervising physician immediately available to respond to emergencies [facility] immediately available    Physician(s) Dr. Doristine Bosworth    Location [facility]    Staff Present Maurice Small, RN, Milus Glazier, MS, EP-C, CCRP;Jessica Hassell Done, MS, ACSM-CEP, Exercise Physiologist;Olinty Celesta Aver, MS, ACSM CEP, Exercise Physiologist;Annedrea Rosezella [place], RN, MHA;Mareena Cavan, RN, BSN    Virtual Visit No    Medication changes reported     No    Fall or balance concerns reported    No    Tobacco Cessation No Change    Warm-up and Cool-down Performed on first and last piece of equipment    Resistance Training Performed Yes    VAD Patient? No    PAD/SET Patient? No      Pain Assessment   Currently in Pain? No/denies    Pain Score 0-No pain    Multiple Pain Sites No           Capillary Blood Glucose: Results for orders placed or performed during the hospital encounter of 07/01/20 (from the past 24 hour(s))  Glucose, capillary     Status: Abnormal   Collection Time: 07/01/20  2:28 PM  Result Value Ref Range   Glucose-Capillary 136 (H) 70 - 99 mg/dL     Exercise Prescription Changes - 07/01/20 1600      Response to Exercise   Blood Pressure (Admit) 146/78    Blood Pressure (Exercise) 130/62    Blood Pressure (Exit) 124/70    Heart Rate (Admit) 98 bpm    Heart Rate (Exercise) 110 bpm    Heart Rate (Exit) 97 bpm    Rating of Perceived Exertion (Exercise) 7    Symptoms None    Comments Pt's first day of exercise in the CRP2 program    Duration Progress to 30 minutes of  aerobic without  signs/symptoms of physical distress    Intensity THRR unchanged      Progression   Progression Continue to progress workloads to maintain intensity without signs/symptoms of physical distress.      Resistance Training   Training Prescription Yes    Weight 2 lbs    Reps 10-15    Time 10 Minutes      Interval Training   Interval Training No      NuStep   Level 1    SPM 75    Minutes 25    METs --   not obtained          Social History   Tobacco Use  Smoking Status Former Smoker  . Packs/day: 0.50  . Years: 20.00  . Pack years: 10.00  . Types: Cigarettes  . Quit date: 51  . Years since quitting: 47.2  Smokeless Tobacco Never Used    Goals Met:  Exercise tolerated well No report of cardiac concerns or symptoms Strength training completed today  Goals Unmet:  Not Applicable  Comments: Christian [name] started cardiac rehab today.  Pt tolerated light exercise without difficulty. VSS, telemetry-Sinus Rhythm, asymptomatic.  Medication list reconciled. Pt denies barriers to medicaiton compliance.  PSYCHOSOCIAL ASSESSMENT:  PHQ-0. Pt exhibits positive  coping skills, hopeful outlook with supportive family. No psychosocial needs identified at this time, no psychosocial interventions necessary.    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   Pt oriented to exercise equipment and routine.    Understanding verbalized. Christian [name] is deconditioned and used a rollator for stability in the gym. Christian [name] uses a cane at other times.Barnet Pall, RN,BSN 07/01/2020 4:27 PM   Dr. Fransico Him is Medical Director for Cardiac Rehab at [facility].

## 2020-07-03 ENCOUNTER — Encounter (HOSPITAL_COMMUNITY)
Admission: RE | Admit: 2020-07-03 | Discharge: 2020-07-03 | Disposition: A | Discharge status: Home / Self Care | Payer: Medicare Other | Source: Ambulatory Visit | Attending: Cardiology | Admitting: Cardiology

## 2020-07-03 ENCOUNTER — Other Ambulatory Visit: Payer: Self-pay

## 2020-07-03 DIAGNOSIS — Z951 Presence of aortocoronary bypass graft: Secondary | ICD-10-CM | POA: Diagnosis not present

## 2020-07-03 DIAGNOSIS — I214 Non-ST elevation (NSTEMI) myocardial infarction: Secondary | ICD-10-CM | POA: Diagnosis not present

## 2020-07-03 DIAGNOSIS — Z7902 Long term (current) use of antithrombotics/antiplatelets: Secondary | ICD-10-CM | POA: Diagnosis not present

## 2020-07-03 DIAGNOSIS — Z7982 Long term (current) use of aspirin: Secondary | ICD-10-CM | POA: Diagnosis not present

## 2020-07-03 DIAGNOSIS — Z7901 Long term (current) use of anticoagulants: Secondary | ICD-10-CM | POA: Diagnosis not present

## 2020-07-03 DIAGNOSIS — Z79899 Other long term (current) drug therapy: Secondary | ICD-10-CM | POA: Diagnosis not present

## 2020-07-03 DIAGNOSIS — Z87891 Personal history of nicotine dependence: Secondary | ICD-10-CM | POA: Diagnosis not present

## 2020-07-05 ENCOUNTER — Other Ambulatory Visit: Payer: Self-pay

## 2020-07-05 ENCOUNTER — Encounter (HOSPITAL_COMMUNITY)
Admission: RE | Admit: 2020-07-05 | Discharge: 2020-07-05 | Disposition: A | Discharge status: Home / Self Care | Payer: Medicare Other | Source: Ambulatory Visit | Attending: Cardiology | Admitting: Cardiology

## 2020-07-05 DIAGNOSIS — Z951 Presence of aortocoronary bypass graft: Secondary | ICD-10-CM

## 2020-07-05 DIAGNOSIS — I214 Non-ST elevation (NSTEMI) myocardial infarction: Secondary | ICD-10-CM | POA: Diagnosis not present

## 2020-07-05 DIAGNOSIS — Z79899 Other long term (current) drug therapy: Secondary | ICD-10-CM | POA: Diagnosis not present

## 2020-07-05 DIAGNOSIS — Z7902 Long term (current) use of antithrombotics/antiplatelets: Secondary | ICD-10-CM | POA: Diagnosis not present

## 2020-07-05 DIAGNOSIS — Z7901 Long term (current) use of anticoagulants: Secondary | ICD-10-CM | POA: Diagnosis not present

## 2020-07-05 DIAGNOSIS — Z87891 Personal history of nicotine dependence: Secondary | ICD-10-CM | POA: Diagnosis not present

## 2020-07-05 DIAGNOSIS — Z7982 Long term (current) use of aspirin: Secondary | ICD-10-CM | POA: Diagnosis not present

## 2020-07-05 NOTE — Progress Notes (Signed)
Christian [name] completed his first week of exercise at cardiac rehab. Continued intermittent resting and exertional BP elevations noted. Will forward to Dr Irven Shelling office for review.Barnet Pall, RN,BSN 07/05/2020 4:13 PM

## 2020-07-08 ENCOUNTER — Encounter (HOSPITAL_COMMUNITY)
Admission: RE | Admit: 2020-07-08 | Discharge: 2020-07-08 | Disposition: A | Discharge status: Home / Self Care | Payer: Medicare Other | Source: Ambulatory Visit | Attending: Cardiology | Admitting: Cardiology

## 2020-07-08 ENCOUNTER — Other Ambulatory Visit: Payer: Self-pay

## 2020-07-08 DIAGNOSIS — Z951 Presence of aortocoronary bypass graft: Secondary | ICD-10-CM

## 2020-07-08 DIAGNOSIS — Z7902 Long term (current) use of antithrombotics/antiplatelets: Secondary | ICD-10-CM | POA: Diagnosis not present

## 2020-07-08 DIAGNOSIS — Z7901 Long term (current) use of anticoagulants: Secondary | ICD-10-CM | POA: Diagnosis not present

## 2020-07-08 DIAGNOSIS — Z7982 Long term (current) use of aspirin: Secondary | ICD-10-CM | POA: Diagnosis not present

## 2020-07-08 DIAGNOSIS — Z87891 Personal history of nicotine dependence: Secondary | ICD-10-CM | POA: Diagnosis not present

## 2020-07-08 DIAGNOSIS — Z79899 Other long term (current) drug therapy: Secondary | ICD-10-CM | POA: Diagnosis not present

## 2020-07-08 DIAGNOSIS — I214 Non-ST elevation (NSTEMI) myocardial infarction: Secondary | ICD-10-CM

## 2020-07-10 ENCOUNTER — Encounter (HOSPITAL_COMMUNITY)
Admission: RE | Admit: 2020-07-10 | Discharge: 2020-07-10 | Disposition: A | Discharge status: Home / Self Care | Payer: Medicare Other | Source: Ambulatory Visit | Attending: Cardiology | Admitting: Cardiology

## 2020-07-10 ENCOUNTER — Other Ambulatory Visit: Payer: Self-pay

## 2020-07-10 DIAGNOSIS — Z7982 Long term (current) use of aspirin: Secondary | ICD-10-CM | POA: Diagnosis not present

## 2020-07-10 DIAGNOSIS — I214 Non-ST elevation (NSTEMI) myocardial infarction: Secondary | ICD-10-CM

## 2020-07-10 DIAGNOSIS — Z79899 Other long term (current) drug therapy: Secondary | ICD-10-CM | POA: Diagnosis not present

## 2020-07-10 DIAGNOSIS — Z951 Presence of aortocoronary bypass graft: Secondary | ICD-10-CM | POA: Diagnosis not present

## 2020-07-10 DIAGNOSIS — Z87891 Personal history of nicotine dependence: Secondary | ICD-10-CM | POA: Diagnosis not present

## 2020-07-10 DIAGNOSIS — Z7902 Long term (current) use of antithrombotics/antiplatelets: Secondary | ICD-10-CM | POA: Diagnosis not present

## 2020-07-10 DIAGNOSIS — Z7901 Long term (current) use of anticoagulants: Secondary | ICD-10-CM | POA: Diagnosis not present

## 2020-07-10 NOTE — Progress Notes (Signed)
QUALITY OF LIFE SCORE REVIEW  Pt completed Quality of Life survey as a participant in Cardiac Rehab.  Scores 21.0 or below are considered low.  Pt score very low in several areas Overall 14.85, Health and Function 14.89, socioeconomic 12.70, physiological and spiritual 16.93, family 14.0. Patient quality of life slightly altered by physical constraints which limits ability to perform as prior to recent cardiac illness. Issac denies being depressed and is feeling stronger  since his surgery. Offered emotional support and reassurance.  Will continue to monitor and intervene as necessary.  Will forward to Dr Nathanial Millman office for review.Barnet Pall, RN,BSN 07/12/2020 9:26 AM

## 2020-07-11 ENCOUNTER — Ambulatory Visit: Payer: Federal, State, Local not specified - PPO | Admitting: Cardiology

## 2020-07-11 ENCOUNTER — Encounter: Payer: Self-pay | Admitting: Cardiology

## 2020-07-11 VITALS — BP 156/87 | HR 92 | Temp 98.0°F | Resp 17 | Ht 67.0 in | Wt 196.6 lb

## 2020-07-11 DIAGNOSIS — I10 Essential (primary) hypertension: Secondary | ICD-10-CM | POA: Diagnosis not present

## 2020-07-11 DIAGNOSIS — I25118 Atherosclerotic heart disease of native coronary artery with other forms of angina pectoris: Secondary | ICD-10-CM

## 2020-07-11 DIAGNOSIS — I255 Ischemic cardiomyopathy: Secondary | ICD-10-CM

## 2020-07-11 DIAGNOSIS — N184 Chronic kidney disease, stage 4 (severe): Secondary | ICD-10-CM | POA: Diagnosis not present

## 2020-07-11 MED ORDER — METOPROLOL SUCCINATE ER 200 MG PO TB24: 200.0000 mg | ORAL_TABLET | Freq: Every day | ORAL | 3 refills | Status: DC

## 2020-07-11 NOTE — Progress Notes (Signed)
Primary Physician/Referring:  Hoyt Koch, MD  Patient ID: Christian [name], male    DOB: 1946/04/13, 75 y.o.   MRN: [id number]  Chief Complaint  Patient presents with  . Hypertension    2-3 WEEKS   HPI:    Christian [name]  is a 75 y.o. AA male with hypertension, hyperlipidemia, DM with stage 3 CKD,  CAD with  stenting to the PDA on 02/27/2014. Patient presented with unstable angina and NSTEMI on 08/09/2019 along with a very small subsegmental pulmonary embolism and discharged on 10/18/2019 after he underwent CABG x4 on 08/15/2019.    He had extremely complex postop complications including cardiogenic shock, cardiac arrest, needing internal CPR in the ICU after opening the chest, A. Fib needing cardioversion, closure office chest wall, developed acute renal failure leading to need for permanent dialysis, right brain infarct with left hemiparesis, multiple blood transfusions.  He was also placed on dialysis however is now off of dialysis since 02/01/2020.  He now presents for follow-up of CAD, hypertension, heart failure.  Received information from cardiac rehab that his heart rate has been trending up and his blood pressure has been trending up.  Patient still in cardiac rehab, fortunately has not had any worsening dyspnea, he has not had any further leg edema, PND or orthopnea.  No chest pain.  Past Medical History:  Diagnosis Date  . BPH (benign prostatic hypertrophy)   . Coronary artery disease   . Diabetes mellitus without complication ([place])   . Dry eyes left  . Hiatal hernia   . 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  LIMA TO LAD, RIMA TO PDA, RADIAL ARTERY TO CIRC AND SEQUENTIALLY TO OM1. 08/15/2019  . Hyperlipidemia   . Hypertension   . Incomplete bladder emptying   . Nocturia   . 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 SOB (shortness  of breath) on exertion    Past Surgical History:  Procedure Laterality Date  . APPLICATION OF WOUND VAC N/A 08/24/2019   Procedure: APPLICATION OF WOUND VAC;  Surgeon: [name], MD;  Location: [place];  Service: Thoracic;  Laterality: N/A;  . APPLICATION OF WOUND VAC  08/29/2019   Procedure: Wound Vac change;  Surgeon: [name], MD;  Location: [place] [facility];  Service: Open Heart Surgery;;  . APPLICATION OF WOUND VAC N/A 09/04/2019   Procedure: Application Of Wound Vac;  Surgeon: Grace Azaryah, MD;  Location: [place];  Service: Open Heart Surgery;  Laterality: N/A;  . APPLICATION OF WOUND VAC N/A 09/06/2019   Procedure: APPLICATION OF ACELL, APPLICATION OF WOUND VAC USING PREVENA INCISIONAL  DRESSING;  Surgeon: Wallace Going, DO;  Location: [place];  Service: Plastics;  Laterality: N/A;  . CARDIAC CATHETERIZATION    . CORONARY ARTERY BYPASS GRAFT N/A 08/15/2019   Procedure: CORONARY ARTERY BYPASS GRAFTING (CABG), x 4 using bilateral IMAs and left radial artery .  LIMA TO LAD, RIMA TO PDA, RADIAL ARTERY TO CIRC AND SEQUENTIALLY TO OM1.;  Surgeon: [name], MD;  Location: [place];  Service: Open Heart Surgery;  Laterality: N/A;  . CORONARY STENT PLACEMENT  02/27/2014   distal rt/pd coronary       dr Einar Gip  . CYSTO/ BLADDER BIOPSY'S/ CAUTHERIZATION  01-14-2004  DR Gaynelle Arabian  . EXPLORATION POST OPERATIVE OPEN HEART N/A 08/16/2019  Procedure: Chest Closure S?P CABG WITH APPLICATION OF PREVENA  INCISIONAL WOUND VAC;  Surgeon: [name], MD;  Location: [place];  Service: Open Heart Surgery;  Laterality: N/A;  . EXPLORATION POST OPERATIVE OPEN HEART N/A 08/21/2019   Procedure: CHEST WASHOUT S/P OPEN CHEST;  Surgeon: [name], MD;  Location: [place];  Service: Open Heart Surgery;  Laterality: N/A;  Open chest with Esmark dressing with Ioban sealant coverage.  . EXPLORATION POST OPERATIVE OPEN HEART N/A 08/18/2019   Procedure: EXPLORATION POST OPERATIVE OPEN HEART (performed  04/23 on unit);  Surgeon: [name], MD;  Location: [place];  Service: Open Heart Surgery;  Laterality: N/A;  . EXPLORATION POST OPERATIVE OPEN HEART N/A 08/24/2019   Procedure: CHEST WASHOUT POST OPERATIVE OPEN HEART;  Surgeon: [name], MD;  Location: [place];  Service: Open Heart Surgery;  Laterality: N/A;  . EXPLORATION POST OPERATIVE OPEN HEART N/A 08/29/2019   Procedure: CHEST WOUND WASHOUT POST OPERATIVE OPEN HEART;  Surgeon: [name], MD;  Location: [place];  Service: Open Heart Surgery;  Laterality: N/A;  . EXPLORATION POST OPERATIVE OPEN HEART N/A 09/04/2019   Procedure: MEDIASTINAL EXPLORATION WITH STERNAL WOUND IRRIGATION;  Surgeon: Grace Burman, MD;  Location: [place];  Service: Open Heart Surgery;  Laterality: N/A;  . EXPLORATION POST OPERATIVE OPEN HEART N/A 09/14/2019   Procedure: EVACUATION OF HEMATOMA;  Surgeon: Grace Dimitrios, MD;  Location: [place];  Service: Open Heart Surgery;  Laterality: N/A;  . IR FLUORO GUIDE CV LINE LEFT  09/19/2019  . IR GASTROSTOMY TUBE MOD SED  10/04/2019  . IR GASTROSTOMY TUBE REMOVAL  11/29/2019  . IR PATIENT EVAL TECH 0-60 MINS  09/29/2019  . IR US GUIDE VASC ACCESS LEFT  09/19/2019  . LAPAROSCOPIC LYSIS OF ADHESIONS N/A 09/06/2019   Procedure: LAPAROSCOPIC OMENTAL HARVEST;  Surgeon: Kinsinger, Arta Bruce, MD;  Location: [place];  Service: General;  Laterality: N/A;  . LEFT HEART CATH AND CORONARY ANGIOGRAPHY N/A 08/10/2019   Procedure: LEFT HEART CATH AND CORONARY ANGIOGRAPHY;  Surgeon: Nigel Mormon, MD;  Location: [place] CV LAB;  Service: Cardiovascular;  Laterality: N/A;  . LEFT HEART CATHETERIZATION WITH CORONARY ANGIOGRAM N/A 02/27/2014   Procedure: LEFT HEART CATHETERIZATION WITH CORONARY ANGIOGRAM;  Surgeon: Laverda Page, MD;  Location: [facility] CATH LAB;  Service: Cardiovascular;  Laterality: N/A;  . MEDIASTINAL EXPLORATION N/A 09/06/2019   Procedure: MEDIASTINAL EXPLORATION;  Surgeon: Grace Duquan, MD;  Location:  [place];  Service: Thoracic;  Laterality: N/A;  . PECTORALIS FLAP  09/06/2019   Procedure: Pectoralis ADVANCEMENT Flap;  Surgeon: Wallace Going, DO;  Location: [place];  Service: Plastics;;  . PERCUTANEOUS CORONARY STENT INTERVENTION (PCI-S)  02/27/2014   Procedure: PERCUTANEOUS CORONARY STENT INTERVENTION (PCI-S);  Surgeon: Laverda Page, MD;  Location: [facility] CATH LAB;  Service: Cardiovascular;;  rt PDA  3.0/28mm Promus stent  . RADIAL ARTERY HARVEST Left 08/15/2019   Procedure: Radial Artery Harvest;  Surgeon: [name], MD;  Location: [place];  Service: Open Heart Surgery;  Laterality: Left;  . RIB PLATING N/A 09/06/2019   Procedure: STERNAL PLATING;  Surgeon: Grace Jeptha, MD;  Location: [place];  Service: Thoracic;  Laterality: N/A;  . TEE WITHOUT CARDIOVERSION N/A 08/15/2019   Procedure: TRANSESOPHAGEAL ECHOCARDIOGRAM (TEE);  Surgeon: [name], MD;  Location: [place];  Service: Open Heart Surgery;  Laterality: N/A;  . TRACHEOSTOMY TUBE PLACEMENT  08/29/2019   Procedure: Tracheostomy;  Surgeon: [name], MD;  Location: [facility] OR;  Service: Open Heart Surgery;;  . TRANSURETHRAL RESECTION OF PROSTATE  04/04/2012   Procedure: TRANSURETHRAL RESECTION OF THE PROSTATE WITH GYRUS INSTRUMENTS;  Surgeon: Ailene Rud, MD;  Location: [place];  Service: Urology;  Laterality: N/A;  . TRANSURETHRAL RESECTION OF PROSTATE N/A 09/27/2014   Procedure: TRANSURETHRAL RESECTION OF THE PROSTATE ;  Surgeon: Carolan Clines, MD;  Location: [facility];  Service: Urology;  Laterality: N/A;  . UPPER GASTROINTESTINAL ENDOSCOPY     Social History   Tobacco Use  . Smoking status: Former Smoker    Packs/day: 0.50    Years: 20.00    Pack years: 10.00    Types: Cigarettes    Quit date: 1975    Years since quitting: 47.2  . Smokeless tobacco: Never Used  Substance Use Topics  . Alcohol use: Not Currently    Alcohol/week: 0.0 standard drinks    Comment: 1 beer week  rarely   ROS   Review of Systems  Cardiovascular: Negative for chest pain, dyspnea on exertion and leg swelling.  Gastrointestinal: Negative for melena.  Neurological: Positive for disturbances in coordination (left sided weakness) and loss of balance.    Objective  Blood pressure (!) 156/87, pulse 92, temperature 98 F (36.7 C), temperature source Temporal, resp. rate 17, height 5\' 7"  (1.702 m), weight 196 lb 9.6 oz (89.2 kg), SpO2 98 %.  Vitals with BMI 07/11/2020 06/28/2020 06/25/2020  Height 5\' 7"  5\' 7"  5\' 7"   Weight 196 lbs 10 oz 202 lbs 6 oz 199 lbs 12 oz  BMI 30.78 37.62 83.15  Systolic 176 160 737  Diastolic 87 92 84  Pulse 92 96 87    Physical Exam Constitutional:      Comments: He is moderately built and mildly obese in no acute distress.  Neck:     Thyroid: No thyromegaly.  Cardiovascular:     Rate and Rhythm: Normal rate and regular rhythm.     Pulses:          Carotid pulses are 2+ on the right side and 2+ on the left side.      Femoral pulses are 2+ on the right side and 2+ on the left side.      Popliteal pulses are 0 on the right side and 0 on the left side.       Dorsalis pedis pulses are 0 on the right side and 0 on the left side.       Posterior tibial pulses are 0 on the right side and 0 on the left side.     Heart sounds: Normal heart sounds. No murmur heard. No gallop.      Comments: NO pedal edema, no JVD.  Pulmonary:     Effort: Pulmonary effort is normal.     Breath sounds: Normal breath sounds.  Chest:     Comments: Sternotomy scar has healed well Abdominal:     General: Bowel sounds are normal.     Palpations: Abdomen is soft.     Laboratory examination:   Recent Labs    10/16/19 0232 10/17/19 0254 10/18/19 0046 12/20/19 1312 03/25/20 1155 05/28/20 1538  NA 131* 131* 132* 136 144 139  K 3.9 3.9 3.9 3.7 4.3 4.0  CL 94* 95* 96* 97* 106 107  CO2 24 21* 23 33* 28 23  GLUCOSE 84 187* 175* 92 98 103*  BUN 65* 83* 37* 12 38* 68*  CREATININE  5.27* 6.48* 3.55* 2.87* 3.18* 3.33*  CALCIUM 9.2 9.0 9.1 9.0 9.3 9.8  GFRNONAA 10* 8* 16*  --   --   --   GFRAA 12* 9* 19*  --   --   --    CrCl cannot be calculated (Patient's most recent lab result is older than the maximum 21 days allowed.).  CMP Latest Ref Rng & Units 05/28/2020 03/25/2020 12/20/2019  Glucose 70 - 99 mg/dL 103(H) 98 92  BUN 6 - 23 mg/dL 68(H) 38(H) 12  Creatinine 0.40 - 1.50 mg/dL 3.33(H) 3.18(H) 2.87(H)  Sodium 135 - 145 mEq/L 139 144 136  Potassium 3.5 - 5.1 mEq/L 4.0 4.3 3.7  Chloride 96 - 112 mEq/L 107 106 97(L)  CO2 19 - 32 mEq/L 23 28 33(H)  Calcium 8.4 - 10.5 mg/dL 9.8 9.3 9.0  Total Protein 6.0 - 8.3 g/dL 8.0 - 5.7(L)  Total Bilirubin 0.2 - 1.2 mg/dL 0.3 - 0.4  Alkaline Phos 39 - 117 U/L 80 - -  AST 0 - 37 U/L 32 - 21  ALT 0 - 53 U/L 28 - 10   CBC Latest Ref Rng & Units 05/28/2020 10/17/2019 10/14/2019  WBC 4.0 - 10.5 K/uL 8.0 12.3(H) 14.2(H)  Hemoglobin 13.0 - 17.0 g/dL 11.9(L) 9.2(L) 9.2(L)  Hematocrit 39.0 - 52.0 % 35.7(L) 29.1(L) 30.2(L)  Platelets 150.0 - 400.0 K/uL 367.0 346 377   Lipid Panel Recent Labs    08/11/19 0426 08/15/19 1958 08/24/19 2003 08/26/19 1028 08/27/19 1611  CHOL 107  --   --   --   --   TRIG 125   < > 117 131 71  LDLCALC 40  --   --   --   --   VLDL 25  --   --   --   --   HDL 42  --   --   --   --   CHOLHDL 2.5  --   --   --   --    < > = values in this interval not displayed.    HEMOGLOBIN A1C Lab Results  Component Value Date   HGBA1C 5.4 03/25/2020   MPG 94 12/20/2019   TSH Recent Labs    05/28/20 1538  TSH 1.24   Medications and allergies  No Known Allergies   Current Outpatient Medications on File Prior to Visit  Medication Sig Dispense Refill  . aspirin EC 81 MG tablet Take 81 mg by mouth daily.    Marland Kitchen atorvastatin (LIPITOR) 20 MG tablet TAKE 1 TABLET BY MOUTH EVERY DAY IN THE EVENING (Patient taking differently: Take 20 mg by mouth every evening.) 90 tablet 3  . clonazePAM (KLONOPIN) 0.5 MG  disintegrating tablet TAKE 1 TABLET BY MOUTH TWICE A DAY 60 tablet 5  . clopidogrel (PLAVIX) 75 MG tablet Take 1 tablet (75 mg total) by mouth daily. 90 tablet 3  . cromolyn (OPTICROM) 4 % ophthalmic solution SMARTSIG:1 Drop(s) In Eye(s) Every 6-12 Hours PRN    . [employer] ACETAMINOPHEN 325 MG tablet TAKE 2 CAPS BY MOUTH EVERY 6 HOURS AS NEEDED FOR PAIN FOR TEMP 100F OR ABOVE 60 tablet 0  . [employer] SENNA PLUS 8.6-50 MG tablet TAKE 2 TABLETS BY MOUTH AT BEDTIME FOR CONSTIPATION 60 tablet 1  . ferrous sulfate 325 (65 FE) MG tablet Take 325 mg by mouth daily with breakfast.    . furosemide (LASIX) 80 MG tablet Take 80 mg by mouth 2 (two) times daily. Dose changed by nephrologist 05/09/20    .  hydrALAZINE (APRESOLINE) 25 MG tablet Take 1 tablet (25 mg total) by mouth 3 (three) times daily. 270 tablet 3  . isosorbide dinitrate (ISORDIL) 30 MG tablet TAKE 1 TABLET BY MOUTH 3 TIMES DAILY. 270 tablet 1  . multivitamin (RENA-VIT) TABS tablet Take 1 tablet by mouth at bedtime.  0  . nitroGLYCERIN (NITROSTAT) 0.4 MG SL tablet Place 0.4 mg under the tongue as directed.    Marland Kitchen oxycodone (OXY-IR) 5 MG capsule Take 5 mg by mouth every 4 (four) hours as needed for pain.    Marland Kitchen PREVIDENT 5000 BOOSTER PLUS 1.1 % PSTE Take 1 application by mouth daily.     . sertraline (ZOLOFT) 25 MG tablet TAKE 1 TABLET ONE TIME A DAY 90 tablet 1  . silodosin (RAPAFLO) 8 MG CAPS capsule Take 8 mg by mouth at bedtime.    . Vitamin D, Ergocalciferol, (DRISDOL) 1.25 MG (50000 UNIT) CAPS capsule Take 1 capsule (50,000 Units total) by mouth every 7 (seven) days. 12 capsule 3   No current facility-administered medications on file prior to visit.    Radiology:  No results found.  Cardiac Studies:   Heart catheterization 08-Sep-2019: LM: Normal LAD: Ostial 95% stenosis Ramus: Ostial/prox 30% stenosis LCx: Ostial 75% stenosis RCA: Prox-mid diffuse 40% calcific disease. Distal RCA 70% ISR of 3.0 by 28 mm Promus placed 02/27/2014.  Pre-CABG  Dopplers including ABI and carotid duplex: No significant carotid artery stenosis.  Antegrade vertebral artery flow. ABI normal, with triphasic waveforms at the level of the ankle.  Hx of CABG 08/15/2019: LIMA TO LAD, RIMA TO PDA, SEQUENTIAL RADIAL ARTERY TO Cx-OM1.  Echocardiogram 08/19/2019:  1. There prominent respiratory displacement of the interventricular  septum, consistent with enhanced ventricular interdependence, likely due  to constrictive physiology.. Left ventricular ejection fraction, by  estimation, is 45 to 50%. The left ventricle  has mildly decreased function. The left ventricle demonstrates global  hypokinesis. Left ventricular diastolic parameters are consistent with  Grade II diastolic dysfunction (pseudonormalization). Elevated left atrial  pressure.  2. Right ventricular systolic function was not well visualized. The right  ventricular size is normal. Tricuspid regurgitation signal is inadequate  for assessing PA pressure.  3. No pericardial fluid is see, but the pericardium is very thick and  thrombus in the pericardial space cannot be ecluded.   EKG:     EKG 07/11/2020: Normal sinus rhythm at rate of 86 bpm, normal axis.  Incomplete right bundle branch block.  Poor R wave progression, anteroseptal infarct old.  Single PVC.  No significant change from 01/16/2020.   Assessment     ICD-10-CM   1. Primary hypertension  I10 EKG 12-Lead    metoprolol succinate (TOPROL-XL) 200 MG 24 hr tablet  2. Atherosclerosis of native coronary artery of native heart with stable angina pectoris ([place])  I25.118 PCV ECHOCARDIOGRAM COMPLETE    metoprolol succinate (TOPROL-XL) 200 MG 24 hr tablet  3. CKD (chronic kidney disease) stage 4, GFR 15-29 ml/min (HCC)  N18.4   4. Ischemic cardiomyopathy  I25.5     Meds ordered this encounter  Medications  . metoprolol succinate (TOPROL-XL) 200 MG 24 hr tablet    Sig: Take 1 tablet (200 mg total) by mouth daily. Take with or immediately  following a meal.    Dispense:  90 tablet    Refill:  3   Medications Discontinued During This Encounter  Medication Reason  . Trospium Chloride 60 MG CP24 Error  . metoprolol succinate (TOPROL-XL) 100 MG 24 hr  tablet Reorder    Orders Placed This Encounter  Procedures  . EKG 12-Lead  . PCV ECHOCARDIOGRAM COMPLETE    Standing Status:   Future    Standing Expiration Date:   07/11/2021    Recommendations:   Christian [name]  is a 75 y.o. AA male with hypertension, hyperlipidemia, DM with stage 3 CKD,  CAD with  stenting to the PDA on 02/27/2014. Patient presented with unstable angina and NSTEMI on 08/09/2019 along with a very small subsegmental pulmonary embolism and discharged on 10/18/2019 after he underwent CABG x4 on 08/15/2019.    He had extremely complex postop complications including cardiogenic shock, cardiac arrest, needing internal CPR in the ICU after opening the chest, A. Fib needing cardioversion, closure office chest wall, developed acute renal failure leading to need for permanent dialysis, right brain infarct with left hemiparesis, multiple blood transfusions.  He was also placed on dialysis however is now off of dialysis since 02/01/2020.  He now presents for follow-up of CAD, hypertension, heart failure.  Since last office visit his weight has decreased, blood pressure is improved but he still remains with elevated blood pressure and also with elevated heart rate.  I will increase his metoprolol succinate from 100 mg to 200 mg daily.  Otherwise he is doing well, without any clinical evidence of heart failure and he has not had any recurrence of chest pain.  I reinforced weight loss and regular exercise.  I will see him back in 6 weeks for follow-up and close monitoring.  He needs repeat echocardiogram to follow-up on CAD, ischemic cardiomyopathy.    Adrian Prows, MD, [facility] 07/11/2020, 6:02 PM Office: [phone]

## 2020-07-12 ENCOUNTER — Other Ambulatory Visit: Payer: Self-pay

## 2020-07-12 ENCOUNTER — Encounter (HOSPITAL_COMMUNITY)
Admission: RE | Admit: 2020-07-12 | Discharge: 2020-07-12 | Disposition: A | Discharge status: Home / Self Care | Payer: Medicare Other | Source: Ambulatory Visit | Attending: Cardiology | Admitting: Cardiology

## 2020-07-12 DIAGNOSIS — N186 End stage renal disease: Secondary | ICD-10-CM | POA: Diagnosis not present

## 2020-07-12 DIAGNOSIS — Z7901 Long term (current) use of anticoagulants: Secondary | ICD-10-CM | POA: Diagnosis not present

## 2020-07-12 DIAGNOSIS — Z951 Presence of aortocoronary bypass graft: Secondary | ICD-10-CM

## 2020-07-12 DIAGNOSIS — Z87891 Personal history of nicotine dependence: Secondary | ICD-10-CM | POA: Diagnosis not present

## 2020-07-12 DIAGNOSIS — Z7982 Long term (current) use of aspirin: Secondary | ICD-10-CM | POA: Diagnosis not present

## 2020-07-12 DIAGNOSIS — R262 Difficulty in walking, not elsewhere classified: Secondary | ICD-10-CM | POA: Diagnosis not present

## 2020-07-12 DIAGNOSIS — Z992 Dependence on renal dialysis: Secondary | ICD-10-CM | POA: Diagnosis not present

## 2020-07-12 DIAGNOSIS — Z79899 Other long term (current) drug therapy: Secondary | ICD-10-CM | POA: Diagnosis not present

## 2020-07-12 DIAGNOSIS — Z7902 Long term (current) use of antithrombotics/antiplatelets: Secondary | ICD-10-CM | POA: Diagnosis not present

## 2020-07-12 DIAGNOSIS — I214 Non-ST elevation (NSTEMI) myocardial infarction: Secondary | ICD-10-CM

## 2020-07-12 NOTE — Progress Notes (Signed)
Medication changes noted. Will continue to monitor BP.Barnet Pall, RN,BSN 07/12/2020 2:43 PM

## 2020-07-14 ENCOUNTER — Other Ambulatory Visit: Payer: Self-pay | Admitting: Internal Medicine

## 2020-07-15 ENCOUNTER — Other Ambulatory Visit: Payer: Self-pay

## 2020-07-15 ENCOUNTER — Encounter (HOSPITAL_COMMUNITY)
Admission: RE | Admit: 2020-07-15 | Discharge: 2020-07-15 | Disposition: A | Discharge status: Home / Self Care | Payer: Medicare Other | Source: Ambulatory Visit | Attending: Cardiology | Admitting: Cardiology

## 2020-07-15 DIAGNOSIS — Z79899 Other long term (current) drug therapy: Secondary | ICD-10-CM | POA: Diagnosis not present

## 2020-07-15 DIAGNOSIS — Z7902 Long term (current) use of antithrombotics/antiplatelets: Secondary | ICD-10-CM | POA: Diagnosis not present

## 2020-07-15 DIAGNOSIS — Z7982 Long term (current) use of aspirin: Secondary | ICD-10-CM | POA: Diagnosis not present

## 2020-07-15 DIAGNOSIS — Z951 Presence of aortocoronary bypass graft: Secondary | ICD-10-CM

## 2020-07-15 DIAGNOSIS — I214 Non-ST elevation (NSTEMI) myocardial infarction: Secondary | ICD-10-CM | POA: Diagnosis not present

## 2020-07-15 DIAGNOSIS — Z87891 Personal history of nicotine dependence: Secondary | ICD-10-CM | POA: Diagnosis not present

## 2020-07-15 DIAGNOSIS — Z7901 Long term (current) use of anticoagulants: Secondary | ICD-10-CM | POA: Diagnosis not present

## 2020-07-16 DIAGNOSIS — Z931 Gastrostomy status: Secondary | ICD-10-CM | POA: Diagnosis not present

## 2020-07-16 DIAGNOSIS — I509 Heart failure, unspecified: Secondary | ICD-10-CM | POA: Diagnosis not present

## 2020-07-16 DIAGNOSIS — Z951 Presence of aortocoronary bypass graft: Secondary | ICD-10-CM | POA: Diagnosis not present

## 2020-07-17 ENCOUNTER — Other Ambulatory Visit: Payer: Self-pay | Admitting: Cardiology

## 2020-07-17 ENCOUNTER — Encounter (HOSPITAL_COMMUNITY)
Admission: RE | Admit: 2020-07-17 | Discharge: 2020-07-17 | Disposition: A | Discharge status: Home / Self Care | Payer: Medicare Other | Source: Ambulatory Visit | Attending: Cardiology | Admitting: Cardiology

## 2020-07-17 ENCOUNTER — Other Ambulatory Visit: Payer: Self-pay

## 2020-07-17 DIAGNOSIS — Z87891 Personal history of nicotine dependence: Secondary | ICD-10-CM | POA: Diagnosis not present

## 2020-07-17 DIAGNOSIS — I214 Non-ST elevation (NSTEMI) myocardial infarction: Secondary | ICD-10-CM

## 2020-07-17 DIAGNOSIS — Z7901 Long term (current) use of anticoagulants: Secondary | ICD-10-CM | POA: Diagnosis not present

## 2020-07-17 DIAGNOSIS — Z79899 Other long term (current) drug therapy: Secondary | ICD-10-CM | POA: Diagnosis not present

## 2020-07-17 DIAGNOSIS — Z951 Presence of aortocoronary bypass graft: Secondary | ICD-10-CM | POA: Diagnosis not present

## 2020-07-17 DIAGNOSIS — Z7982 Long term (current) use of aspirin: Secondary | ICD-10-CM | POA: Diagnosis not present

## 2020-07-17 DIAGNOSIS — Z7902 Long term (current) use of antithrombotics/antiplatelets: Secondary | ICD-10-CM | POA: Diagnosis not present

## 2020-07-17 NOTE — Progress Notes (Addendum)
Christian [name] 75 y.o. male Nutrition Note  Diagnosis:NSTEMI 08/09/19   08/15/19 S/P CABG x 2  Past Medical History:  Diagnosis Date  . BPH (benign prostatic hypertrophy)   . Coronary artery disease   . Diabetes mellitus without complication ([place])   . Dry eyes left  . Hiatal hernia   . 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  LIMA TO LAD, RIMA TO PDA, RADIAL ARTERY TO CIRC AND SEQUENTIALLY TO OM1. 08/15/2019  . Hyperlipidemia   . Hypertension   . Incomplete bladder emptying   . Nocturia   . 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 SOB (shortness of breath) on exertion      Medications reviewed.   Current Outpatient Medications:  .  aspirin EC 81 MG tablet, Take 81 mg by mouth daily., Disp: , Rfl:  .  atorvastatin (LIPITOR) 20 MG tablet, TAKE 1 TABLET BY MOUTH EVERY DAY IN THE EVENING (Patient taking differently: Take 20 mg by mouth every evening.), Disp: 90 tablet, Rfl: 3 .  clonazePAM (KLONOPIN) 0.5 MG disintegrating tablet, TAKE 1 TABLET BY MOUTH TWICE A DAY, Disp: 60 tablet, Rfl: 5 .  clopidogrel (PLAVIX) 75 MG tablet, Take 1 tablet (75 mg total) by mouth daily., Disp: 90 tablet, Rfl: 3 .  cromolyn (OPTICROM) 4 % ophthalmic solution, SMARTSIG:1 Drop(s) In Eye(s) Every 6-12 Hours PRN, Disp: , Rfl:  .  [employer]S ACETAMINOPHEN 325 MG tablet, TAKE 2 CAPS BY MOUTH EVERY 6 HOURS AS NEEDED FOR PAIN FOR TEMP 100F OR ABOVE, Disp: 60 tablet, Rfl: 0 .  [facility]S SENNA PLUS 8.6-50 MG tablet, TAKE 2 TABLETS BY MOUTH AT BEDTIME FOR CONSTIPATION, Disp: 60 tablet, Rfl: 1 .  ferrous sulfate 325 (65 FE) MG tablet, Take 325 mg by mouth daily with breakfast., Disp: , Rfl:  .  furosemide (LASIX) 80 MG tablet, Take 80 mg by mouth 2 (two) times daily. Dose changed by nephrologist 05/09/20, Disp: , Rfl:  .  hydrALAZINE (APRESOLINE) 25 MG tablet, Take 1 tablet (25 mg total) by  mouth 3 (three) times daily., Disp: 270 tablet, Rfl: 3 .  isosorbide dinitrate (ISORDIL) 30 MG tablet, TAKE 1 TABLET BY MOUTH 3 TIMES DAILY., Disp: 270 tablet, Rfl: 1 .  metoprolol succinate (TOPROL-XL) 200 MG 24 hr tablet, Take 1 tablet (200 mg total) by mouth daily. Take with or immediately following a meal., Disp: 90 tablet, Rfl: 3 .  multivitamin (RENA-VIT) TABS tablet, Take 1 tablet by mouth at bedtime., Disp: , Rfl: 0 .  nitroGLYCERIN (NITROSTAT) 0.4 MG SL tablet, Place 0.4 mg under the tongue as directed., Disp: , Rfl:  .  oxycodone (OXY-IR) 5 MG capsule, Take 5 mg by mouth every 4 (four) hours as needed for pain., Disp: , Rfl:  .  PREVIDENT 5000 BOOSTER PLUS 1.1 % PSTE, Take 1 application by mouth daily. , Disp: , Rfl:  .  sertraline (ZOLOFT) 25 MG tablet, TAKE 1 TABLET ONE TIME A DAY, Disp: 90 tablet, Rfl: 1 .  silodosin (RAPAFLO) 8 MG CAPS capsule, Take 8 mg by mouth at bedtime., Disp: , Rfl:  .  Vitamin D, Ergocalciferol, (DRISDOL) 1.25 MG (50000 UNIT) CAPS capsule, Take 1 capsule (50,000 Units total) by mouth every 7 (seven) days., Disp: 12 capsule, Rfl: 3   Ht Readings from Last 1 Encounters:  07/11/20 5\' 7"  (1.702 m)  Wt Readings from Last 3 Encounters:  07/11/20 196 lb 9.6 oz (89.2 kg)  06/28/20 202 lb 6.4 oz (91.8 kg)  06/25/20 199 lb 11.8 oz (90.6 kg)     There is no height or weight on file to calculate BMI.   Social History   Tobacco Use  Smoking Status Former Smoker  . Packs/day: 0.50  . Years: 20.00  . Pack years: 10.00  . Types: Cigarettes  . Quit date: 69  . Years since quitting: 47.2  Smokeless Tobacco Never Used     Lab Results  Component Value Date   CHOL 107 08/11/2019   Lab Results  Component Value Date   HDL 42 08/11/2019   Lab Results  Component Value Date   LDLCALC 40 08/11/2019   Lab Results  Component Value Date   TRIG 71 08/27/2019     Lab Results  Component Value Date   HGBA1C 5.4 03/25/2020     CBG (last 3)  No  results for input(s): GLUCAP in the last 72 hours.   Nutrition Note  Spoke with pt. Nutrition Plan and Nutrition Survey goals reviewed with pt.  Pt has Pre-diabetes. Last A1c indicates blood glucose well-controlled. Pt used to take metformin. He does not take any diabetes medications now.    Per discussion, pt does use canned/convenience foods often. Pt does add salt to food. He tries to use more pepper than salt. Pt states only eating fast food/restaurant food 1x/month. He also reported eating [employer] for lunch and dinner yesterday.  Pt states his MD has instructed a 32 oz fluid restriction. He said he was drinking 64 oz of water and has been trying to reduce water intake. However, he does drink 32 oz coffee daily. He also drinks diet soda and tea throughout the day.  Reviewed label reading and eating out less often and smarted beverage choices.   Pt expressed understanding of the information reviewed.    Nutrition Diagnosis ? Excessive sodium intake related to over consumption of processed food as evidenced by frequent consumption of convenience food/ canned vegetables and eating out frequently.   Nutrition Intervention ? Pt's individual nutrition plan reviewed with pt. ? Benefits of adopting Heart Healthy diet discussed when Picture Your Plate reviewed.   ? Pt given handouts for: ? Nutrition I class ? Nutrition II class ?  ? Continue client-centered nutrition education by RD, as part of interdisciplinary care.  Goal(s) ? Reduce sodium intake to <2000 mg/day by reading labels and avoiding fast food ? Pt to reduce intake of soda, tea, and coffee  Plan:    Will provide client-centered nutrition education as part of interdisciplinary care  Monitor and evaluate progress toward nutrition goal with team.   Michaele Offer, MS, RDN, LDN

## 2020-07-19 ENCOUNTER — Encounter (HOSPITAL_COMMUNITY)
Admission: RE | Admit: 2020-07-19 | Discharge: 2020-07-19 | Disposition: A | Discharge status: Home / Self Care | Payer: Medicare Other | Source: Ambulatory Visit | Attending: Cardiology | Admitting: Cardiology

## 2020-07-19 ENCOUNTER — Other Ambulatory Visit: Payer: Self-pay

## 2020-07-19 DIAGNOSIS — Z7901 Long term (current) use of anticoagulants: Secondary | ICD-10-CM | POA: Diagnosis not present

## 2020-07-19 DIAGNOSIS — Z951 Presence of aortocoronary bypass graft: Secondary | ICD-10-CM | POA: Diagnosis not present

## 2020-07-19 DIAGNOSIS — I214 Non-ST elevation (NSTEMI) myocardial infarction: Secondary | ICD-10-CM

## 2020-07-19 DIAGNOSIS — Z7902 Long term (current) use of antithrombotics/antiplatelets: Secondary | ICD-10-CM | POA: Diagnosis not present

## 2020-07-19 DIAGNOSIS — Z7982 Long term (current) use of aspirin: Secondary | ICD-10-CM | POA: Diagnosis not present

## 2020-07-19 DIAGNOSIS — Z87891 Personal history of nicotine dependence: Secondary | ICD-10-CM | POA: Diagnosis not present

## 2020-07-19 DIAGNOSIS — Z79899 Other long term (current) drug therapy: Secondary | ICD-10-CM | POA: Diagnosis not present

## 2020-07-22 ENCOUNTER — Other Ambulatory Visit: Payer: Self-pay

## 2020-07-22 ENCOUNTER — Encounter (HOSPITAL_COMMUNITY)
Admission: RE | Admit: 2020-07-22 | Discharge: 2020-07-22 | Disposition: A | Discharge status: Home / Self Care | Payer: Medicare Other | Source: Ambulatory Visit | Attending: Cardiology | Admitting: Cardiology

## 2020-07-22 ENCOUNTER — Ambulatory Visit: Payer: Federal, State, Local not specified - PPO

## 2020-07-22 VITALS — Wt 196.0 lb

## 2020-07-22 DIAGNOSIS — I214 Non-ST elevation (NSTEMI) myocardial infarction: Secondary | ICD-10-CM

## 2020-07-22 DIAGNOSIS — Z951 Presence of aortocoronary bypass graft: Secondary | ICD-10-CM | POA: Diagnosis not present

## 2020-07-22 DIAGNOSIS — I25118 Atherosclerotic heart disease of native coronary artery with other forms of angina pectoris: Secondary | ICD-10-CM | POA: Diagnosis not present

## 2020-07-22 DIAGNOSIS — Z79899 Other long term (current) drug therapy: Secondary | ICD-10-CM | POA: Diagnosis not present

## 2020-07-22 DIAGNOSIS — Z7982 Long term (current) use of aspirin: Secondary | ICD-10-CM | POA: Diagnosis not present

## 2020-07-22 DIAGNOSIS — Z7901 Long term (current) use of anticoagulants: Secondary | ICD-10-CM | POA: Diagnosis not present

## 2020-07-22 DIAGNOSIS — Z87891 Personal history of nicotine dependence: Secondary | ICD-10-CM | POA: Diagnosis not present

## 2020-07-22 DIAGNOSIS — Z7902 Long term (current) use of antithrombotics/antiplatelets: Secondary | ICD-10-CM | POA: Diagnosis not present

## 2020-07-23 NOTE — Progress Notes (Signed)
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  aspirin EC 81 MG tablet, Take 81 mg by mouth daily., Disp: , Rfl:  .  atorvastatin (LIPITOR) 20 MG tablet, Take 1 tablet (20 mg total) by mouth every evening., Disp: 90 tablet, Rfl: 3 .  clonazePAM (KLONOPIN) 0.5 MG disintegrating tablet, TAKE 1 TABLET BY MOUTH TWICE A DAY, Disp: 60 tablet, Rfl: 5 .  clopidogrel (PLAVIX) 75 MG tablet, Take 1 tablet (75 mg total) by mouth daily., Disp: 90 tablet, Rfl: 3 .  cromolyn (OPTICROM) 4 % ophthalmic solution, SMARTSIG:1 Drop(s) In Eye(s) Every 6-12 Hours PRN, Disp: , Rfl:  .  [employer] ACETAMINOPHEN 325 MG tablet, TAKE 2 CAPS BY MOUTH EVERY 6 HOURS AS NEEDED FOR PAIN FOR TEMP 100F OR ABOVE, Disp: 60 tablet, Rfl: 0 .  [employer] SENNA PLUS 8.6-50 MG tablet, TAKE 2 TABLETS BY MOUTH AT BEDTIME FOR CONSTIPATION, Disp: 60 tablet, Rfl: 1 .  ferrous sulfate 325 (65 FE) MG tablet, Take 325 mg by mouth daily with breakfast., Disp: , Rfl:  .  furosemide (LASIX) 80 MG tablet, Take 80 mg by mouth 2 (two) times daily. Dose changed by nephrologist 05/09/20, Disp: , Rfl:  .  hydrALAZINE (APRESOLINE) 25 MG tablet, Take 1 tablet (25 mg total) by mouth 3 (three) times daily., Disp: 270 tablet, Rfl: 3 .  isosorbide dinitrate (ISORDIL) 30 MG tablet, TAKE 1 TABLET BY MOUTH 3 TIMES DAILY., Disp: 270 tablet, Rfl: 1 .  metoprolol succinate (TOPROL-XL) 200 MG 24 hr tablet, Take 1 tablet (200 mg total) by mouth daily.  Take with or immediately following a meal., Disp: 90 tablet, Rfl: 3 .  multivitamin (RENA-VIT) TABS tablet, Take 1 tablet by mouth at bedtime., Disp: , Rfl: 0 .  nitroGLYCERIN (NITROSTAT) 0.4 MG SL tablet, Place 0.4 mg under the tongue as directed., Disp: , Rfl:  .  oxycodone (OXY-IR) 5 MG capsule, Take 5 mg by mouth every 4 (four) hours as needed for pain., Disp: , Rfl:  .  PREVIDENT 5000 BOOSTER PLUS 1.1 % PSTE, Take 1 application by mouth daily. , Disp: , Rfl:  .  sertraline (ZOLOFT) 25 MG tablet, TAKE 1 TABLET ONE TIME A DAY, Disp: 90 tablet, Rfl: 1 .  silodosin (RAPAFLO) 8 MG CAPS capsule, Take 8 mg by mouth at bedtime., Disp: , Rfl:  .  Vitamin D, Ergocalciferol, (DRISDOL) 1.25 MG (50000 UNIT) CAPS capsule, Take 1 capsule (50,000 Units total) by mouth every 7 (seven) days., Disp: 12 capsule, Rfl: 3  Past Medical History: Past Medical History:  Diagnosis Date  . BPH (benign prostatic hypertrophy)   . Coronary artery disease   . Diabetes mellitus without complication ([place])   . Dry eyes left  . Hiatal hernia   . 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  LIMA TO LAD, RIMA TO PDA, RADIAL ARTERY TO CIRC AND SEQUENTIALLY TO OM1. 08/15/2019  . Hyperlipidemia   .  Hypertension   . Incomplete bladder emptying   . Nocturia   . 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 SOB (shortness of breath) on exertion     Tobacco Use: Social History   Tobacco Use  Smoking Status Former Smoker  . Packs/day: 0.50  . Years: 20.00  . Pack years: 10.00  . Types: Cigarettes  . Quit date: 31  . Years since quitting: 47.2  Smokeless Tobacco Never Used    Labs: Recent Chemical engineer    Labs for ITP Cardiac and Pulmonary Rehab Latest Ref Rng & Units 09/01/2019 09/01/2019 09/07/2019 12/20/2019 03/25/2020   Cholestrol 0 - 200 mg/dL - - - - -   LDLCALC 0 - 99 mg/dL - -  - - -   LDLDIRECT mg/dL - - - - -   HDL >40 mg/dL - - - - -   Trlycerides <150 mg/dL - - - - -   Hemoglobin A1c 4.6 - 6.5 % - - - 4.9 5.4   PHART 7.350 - 7.450 - - 7.481(H) - -   PCO2ART 32.0 - 48.0 mmHg - - 31.4(L) - -   HCO3 20.0 - 28.0 mmol/L - - 23.3 - -   TCO2 22 - 32 mmol/L - - 24 - -   ACIDBASEDEF 0.0 - 2.0 mmol/L - - - - -   O2SAT % 39.4 55.6 99.0 - -      Capillary Blood Glucose: Lab Results  Component Value Date   GLUCAP 136 (H) 07/01/2020   GLUCAP 143 (H) 07/01/2020   GLUCAP 200 (H) 10/18/2019   GLUCAP 276 (H) 10/18/2019   GLUCAP 145 (H) 10/18/2019     Exercise Target Goals: Exercise Program Goal: Individual exercise prescription set using results from initial 6 min walk test and THRR while considering  patient's activity barriers and safety.   Exercise Prescription Goal: Starting with aerobic activity 30 plus minutes a day, 3 days per week for initial exercise prescription. Provide home exercise prescription and guidelines that participant acknowledges understanding prior to discharge.  Activity Barriers & Risk Stratification:  Activity Barriers & Cardiac Risk Stratification - 06/25/20 1339      Activity Barriers & Cardiac Risk Stratification   Activity Barriers Back Problems;Deconditioning;Muscular Weakness;Shortness of Breath;Balance Concerns;History of Falls;Assistive Device    Cardiac Risk Stratification High           6 Minute Walk:  6 Minute Walk    Row Name 06/25/20 1337         6 Minute Walk   Phase Initial     Distance 680 feet     Walk Time 6 minutes     # of Rest Breaks 0     MPH 1.29     METS 1.92     RPE 9     Perceived Dyspnea  3     VO2 Peak 6.71     Symptoms Yes (comment)     Comments SOB, RPD = 3     Resting HR 99 bpm     Resting BP 144/84     Resting Oxygen Saturation  97 %     Exercise Oxygen Saturation  during 6 min walk 96 %     Max Ex. HR 115 bpm     Max Ex. BP 170/94     2 Minute Post BP 158/96  Oxygen Initial Assessment:   Oxygen Re-Evaluation:   Oxygen Discharge (Final Oxygen Re-Evaluation):   Initial Exercise Prescription:  Initial Exercise Prescription - 06/25/20 1300      Date of Initial Exercise RX and Referring Provider   Date 06/25/20    Referring Provider Adrian Prows, MD    Expected Discharge Date 08/23/20      NuStep   Level 1    SPM 75    Minutes 25    METs 1.8      Prescription Details   Frequency (times per week) 3    Duration Progress to 30 minutes of continuous aerobic without signs/symptoms of physical distress      Intensity   THRR 40-80% of Max Heartrate 59-118    Ratings of Perceived Exertion 11-13    Perceived Dyspnea 0-4      Progression   Progression Continue progressive overload as per policy without signs/symptoms or physical distress.      Resistance Training   Training Prescription Yes    Weight 2 lbs    Reps 10-15           Perform Capillary Blood Glucose checks as needed.  Exercise Prescription Changes:  Exercise Prescription Changes    Row Name 07/01/20 1600 07/15/20 1450           Response to Exercise   Blood Pressure (Admit) 146/78 156/84      Blood Pressure (Exercise) 130/62 146/82      Blood Pressure (Exit) 124/70 142/88      Heart Rate (Admit) 98 bpm 86 bpm      Heart Rate (Exercise) 110 bpm 105 bpm      Heart Rate (Exit) 97 bpm 89 bpm      Rating of Perceived Exertion (Exercise) 7 9      Symptoms None None      Comments Pt's first day of exercise in the CRP2 program Reviewed METs      Duration Progress to 30 minutes of  aerobic without signs/symptoms of physical distress Continue with 30 min of aerobic exercise without signs/symptoms of physical distress.      Intensity THRR unchanged THRR unchanged             Progression   Progression Continue to progress workloads to maintain intensity without signs/symptoms of physical distress. Continue to progress workloads to maintain intensity without  signs/symptoms of physical distress.      Average METs -- 1.7             Resistance Training   Training Prescription Yes No      Weight 2 lbs --      Reps 10-15 --      Time 10 Minutes --             Interval Training   Interval Training No No             NuStep   Level 1 3      SPM 75 70      Minutes 25 30      METs --  not obtained 1.7             Exercise Comments:  Exercise Comments    Row Name 07/01/20 1615 07/15/20 1500         Exercise Comments Pt's first day in the CRP2 program. Pt tolerated session well. Reviewed METS. Pt making slow progress. Pt demonstates good attendence to the CRP2 program.  Exercise Goals and Review:  Exercise Goals    Row Name 06/25/20 1344             Exercise Goals   Increase Physical Activity Yes       Intervention Provide advice, education, support and counseling about physical activity/exercise needs.;Develop an individualized exercise prescription for aerobic and resistive training based on initial evaluation findings, risk stratification, comorbidities and participant's personal goals.       Expected Outcomes Short Term: Attend rehab on a regular basis to increase amount of physical activity.;Long Term: Add in home exercise to make exercise part of routine and to increase amount of physical activity.;Long Term: Exercising regularly at least 3-5 days a week.       Increase Strength and Stamina Yes       Intervention Provide advice, education, support and counseling about physical activity/exercise needs.;Develop an individualized exercise prescription for aerobic and resistive training based on initial evaluation findings, risk stratification, comorbidities and participant's personal goals.       Expected Outcomes Short Term: Increase workloads from initial exercise prescription for resistance, speed, and METs.;Short Term: Perform resistance training exercises routinely during rehab and add in resistance training at  home;Long Term: Improve cardiorespiratory fitness, muscular endurance and strength as measured by increased METs and functional capacity (6MWT)       Able to understand and use rate of perceived exertion (RPE) scale Yes       Intervention Provide education and explanation on how to use RPE scale       Expected Outcomes Short Term: Able to use RPE daily in rehab to express subjective intensity level;Long Term:  Able to use RPE to guide intensity level when exercising independently       Able to understand and use Dyspnea scale Yes       Intervention Provide education and explanation on how to use Dyspnea scale       Expected Outcomes Short Term: Able to use Dyspnea scale daily in rehab to express subjective sense of shortness of breath during exertion;Long Term: Able to use Dyspnea scale to guide intensity level when exercising independently       Knowledge and understanding of Target Heart Rate Range (THRR) Yes       Intervention Provide education and explanation of THRR including how the numbers were predicted and where they are located for reference       Expected Outcomes Short Term: Able to state/look up THRR;Short Term: Able to use daily as guideline for intensity in rehab;Long Term: Able to use THRR to govern intensity when exercising independently       Understanding of Exercise Prescription Yes       Intervention Provide education, explanation, and written materials on patient's individual exercise prescription       Expected Outcomes Short Term: Able to explain program exercise prescription;Long Term: Able to explain home exercise prescription to exercise independently              Exercise Goals Re-Evaluation :  Exercise Goals Re-Evaluation    Row Name 07/01/20 1614             Exercise Goal Re-Evaluation   Exercise Goals Review Increase Physical Activity;Increase Strength and Stamina;Able to understand and use rate of perceived exertion (RPE) scale;Knowledge and understanding of  Target Heart Rate Range (THRR);Understanding of Exercise Prescription       Comments Pt's first day of exercise in the CRP2 program. Pt understands the exercise Rx, RPE scale, and  THRR.       Expected Outcomes Will continue to monitor patient and progress exercise workloads as tolerated.               Discharge Exercise Prescription (Final Exercise Prescription Changes):  Exercise Prescription Changes - 07/15/20 1450      Response to Exercise   Blood Pressure (Admit) 156/84    Blood Pressure (Exercise) 146/82    Blood Pressure (Exit) 142/88    Heart Rate (Admit) 86 bpm    Heart Rate (Exercise) 105 bpm    Heart Rate (Exit) 89 bpm    Rating of Perceived Exertion (Exercise) 9    Symptoms None    Comments Reviewed METs    Duration Continue with 30 min of aerobic exercise without signs/symptoms of physical distress.    Intensity THRR unchanged      Progression   Progression Continue to progress workloads to maintain intensity without signs/symptoms of physical distress.    Average METs 1.7      Resistance Training   Training Prescription No      Interval Training   Interval Training No      NuStep   Level 3    SPM 70    Minutes 30    METs 1.7           Nutrition:  Target Goals: Understanding of nutrition guidelines, daily intake of sodium 1500mg , cholesterol 200mg , calories 30% from fat and 7% or less from saturated fats, daily to have 5 or more servings of fruits and vegetables.  Biometrics:  Pre Biometrics - 06/25/20 1045      Pre Biometrics   Waist Circumference 44 inches    Hip Circumference 46 inches    Waist to Hip Ratio 0.96 %    Triceps Skinfold 20 mm    % Body Fat 66 %    Grip Strength 27 kg    Flexibility 0 in   could not do   Single Leg Stand --   not done. H/O falls, assistive decvie.           Nutrition Therapy Plan and Nutrition Goals:  Nutrition Therapy & Goals - 07/17/20 1425      Nutrition Therapy   Diet TLC; low sodium    Drug/Food  Interactions Statins/Certain Fruits      Personal Nutrition Goals   Nutrition Goal Reduce sodium intake to <2000 mg/day by reading labels and avoiding fast food    Personal Goal #2 Pt to reduce intake of soda, tea, and coffee      Intervention Plan   Intervention Prescribe, educate and counsel regarding individualized specific dietary modifications aiming towards targeted core components such as weight, hypertension, lipid management, diabetes, heart failure and other comorbidities.;Nutrition handout(s) given to patient.    Expected Outcomes Short Term Goal: Understand basic principles of dietary content, such as calories, fat, sodium, cholesterol and nutrients.;Long Term Goal: Adherence to prescribed nutrition plan.           Nutrition Assessments:  MEDIFICTS Score Key:  ?70 Need to make dietary changes   40-70 Heart Healthy Diet  ? 40 Therapeutic Level Cholesterol Diet  Flowsheet Row CARDIAC REHAB PHASE II EXERCISE from 07/17/2020 in [facility]  Picture Your Plate Total Score on Admission 72     Picture Your Plate Scores:  <12 Unhealthy dietary pattern with much room for improvement.  41-50 Dietary pattern unlikely to meet recommendations for good health and room for improvement.  51-60  More healthful dietary pattern, with some room for improvement.   >60 Healthy dietary pattern, although there may be some specific behaviors that could be improved.    Nutrition Goals Re-Evaluation:  Nutrition Goals Re-Evaluation    [place] Name 07/17/20 1426             Goals   Current Weight 196 lb (88.9 kg)       Nutrition Goal Reduce sodium intake to <2000 mg/day by reading labels and avoiding fast food               Personal Goal #2 Re-Evaluation   Personal Goal #2 Pt to reduce intake of soda, tea, and coffee              Nutrition Goals Discharge (Final Nutrition Goals Re-Evaluation):  Nutrition Goals Re-Evaluation - 07/17/20 1426       Goals   Current Weight 196 lb (88.9 kg)    Nutrition Goal Reduce sodium intake to <2000 mg/day by reading labels and avoiding fast food      Personal Goal #2 Re-Evaluation   Personal Goal #2 Pt to reduce intake of soda, tea, and coffee           Psychosocial: Target Goals: Acknowledge presence or absence of significant depression and/or stress, maximize coping skills, provide positive support system. Participant is able to verbalize types and ability to use techniques and skills needed for reducing stress and depression.  Initial Review & Psychosocial Screening:  Initial Psych Review & Screening - 06/25/20 1428      Initial Review   Current issues with Current Stress Concerns;Current Anxiety/Panic    Source of Stress Concerns Family;Chronic Illness    Comments Yaxiel is recovering from a lengthy hospitalization and recovery. Finnlee has anxiety that is controlled.      Family Dynamics   Good Support System? Yes   Issac has his wife for support.     Barriers   Psychosocial barriers to participate in program The patient should benefit from training in stress management and relaxation.      Screening Interventions   Interventions To provide support and resources with identified psychosocial needs    Expected Outcomes Long Term Goal: Stressors or current issues are controlled or eliminated.;Short Term goal: Identification and review with participant of any Quality of Life or Depression concerns found by scoring the questionnaire.           Quality of Life Scores:  Quality of Life - 06/25/20 1333      Quality of Life   Select Quality of Life      Quality of Life Scores   Health/Function Pre 14.89 %    Socioeconomic Pre 12.7 %    Psych/Spiritual Pre 16.93 %    Family Pre 14 %    GLOBAL Pre 14.85 %          Scores of 19 and below usually indicate a poorer quality of life in these areas.  A difference of  2-3 points is a clinically meaningful difference.  A difference of 2-3  points in the total score of the Quality of Life Index has been associated with significant improvement in overall quality of life, self-image, physical symptoms, and general health in studies assessing change in quality of life.  PHQ-9: Recent Review Flowsheet Data    Depression screen [facility] 2/9 06/25/2020 06/25/2020 05/28/2020 11/23/2018 10/18/2017   Decreased Interest 0 0 0 0 0   Down, Depressed, Hopeless 0 0 0 0 0  PHQ - 2 Score 0 0 0 0 0     Interpretation of Total Score  Total Score Depression Severity:  1-4 = Minimal depression, 5-9 = Mild depression, 10-14 = Moderate depression, 15-19 = Moderately severe depression, 20-27 = Severe depression   Psychosocial Evaluation and Intervention:   Psychosocial Re-Evaluation:  Psychosocial Re-Evaluation    [place] Name 07/23/20 1714             Psychosocial Re-Evaluation   Current issues with Current Anxiety/Panic;Current Stress Concerns       Comments Xavi has not voiced any increased concerns or stressors  or anxietysince participating in phase 2 cardiac rehab       Expected Outcomes Day will have decreased stress and anxiety upon completion of phase 2 cardiac rehab       Interventions Encouraged to attend Cardiac Rehabilitation for the exercise;Stress management education       Continue Psychosocial Services  No Follow up required       Comments Will continue to offer support as needed               Initial Review   Source of Stress Concerns Chronic Illness;Unable to participate in former interests or hobbies;Unable to perform yard/household activities              Psychosocial Discharge (Final Psychosocial Re-Evaluation):  Psychosocial Re-Evaluation - 07/23/20 1714      Psychosocial Re-Evaluation   Current issues with Current Anxiety/Panic;Current Stress Concerns    Comments Delbert has not voiced any increased concerns or stressors  or anxietysince participating in phase 2 cardiac rehab    Expected Outcomes Malcolm will have  decreased stress and anxiety upon completion of phase 2 cardiac rehab    Interventions Encouraged to attend Cardiac Rehabilitation for the exercise;Stress management education    Continue Psychosocial Services  No Follow up required    Comments Will continue to offer support as needed      Initial Review   Source of Stress Concerns Chronic Illness;Unable to participate in former interests or hobbies;Unable to perform yard/household activities           Vocational Rehabilitation: Provide vocational rehab assistance to qualifying candidates.   Vocational Rehab Evaluation & Intervention:  Vocational Rehab - 06/25/20 1438      Initial Vocational Rehab Evaluation & Intervention   Assessment shows need for Vocational Rehabilitation No   Woods is retired and does not need vocatinal rehab at this time.          Education: Education Goals: Education classes will be provided on a weekly basis, covering required topics. Participant will state understanding/return demonstration of topics presented.  Learning Barriers/Preferences:  Learning Barriers/Preferences - 06/25/20 1334      Learning Barriers/Preferences   Learning Barriers Sight   wears glasses   Learning Preferences Audio;Computer/Internet;Group Instruction;Pictoral;Skilled Demonstration;Individual Instruction;Verbal Instruction;Video;Written Material           Education Topics: Hypertension, Hypertension Reduction -Define heart disease and high blood pressure. Discus how high blood pressure affects the body and ways to reduce high blood pressure.   Exercise and Your Heart -Discuss why it is important to exercise, the FITT principles of exercise, normal and abnormal responses to exercise, and how to exercise safely.   Angina -Discuss definition of angina, causes of angina, treatment of angina, and how to decrease risk of having angina.   Cardiac Medications -Review what the following cardiac medications are used for,  how they affect the body, and side effects  that may occur when taking the medications.  Medications include Aspirin, Beta blockers, calcium channel blockers, ACE Inhibitors, angiotensin receptor blockers, diuretics, digoxin, and antihyperlipidemics.   Congestive Heart Failure -Discuss the definition of CHF, how to live with CHF, the signs and symptoms of CHF, and how keep track of weight and sodium intake.   Heart Disease and Intimacy -Discus the effect sexual activity has on the heart, how changes occur during intimacy as we age, and safety during sexual activity.   Smoking Cessation / COPD -Discuss different methods to quit smoking, the health benefits of quitting smoking, and the definition of COPD.   Nutrition I: Fats -Discuss the types of cholesterol, what cholesterol does to the heart, and how cholesterol levels can be controlled.   Nutrition II: Labels -Discuss the different components of food labels and how to read food label   Heart Parts/Heart Disease and PAD -Discuss the anatomy of the heart, the pathway of blood circulation through the heart, and these are affected by heart disease.   Stress I: Signs and Symptoms -Discuss the causes of stress, how stress may lead to anxiety and depression, and ways to limit stress.   Stress II: Relaxation -Discuss different types of relaxation techniques to limit stress.   Warning Signs of Stroke / TIA -Discuss definition of a stroke, what the signs and symptoms are of a stroke, and how to identify when someone is having stroke.   Knowledge Questionnaire Score:  Knowledge Questionnaire Score - 06/25/20 1336      Knowledge Questionnaire Score   Pre Score 15/24           Core Components/Risk Factors/Patient Goals at Admission:  Personal Goals and Risk Factors at Admission - 06/25/20 1336      Core Components/Risk Factors/Patient Goals on Admission    Weight Management Yes;Obesity;Weight Loss    Intervention Weight  Management: Develop a combined nutrition and exercise program designed to reach desired caloric intake, while maintaining appropriate intake of nutrient and fiber, sodium and fats, and appropriate energy expenditure required for the weight goal.;Weight Management: Provide education and appropriate resources to help participant work on and attain dietary goals.;Weight Management/Obesity: Establish reasonable short term and long term weight goals.;Obesity: Provide education and appropriate resources to help participant work on and attain dietary goals.    Admit Weight 199 lb 11.8 oz (90.6 kg)    Expected Outcomes Short Term: Continue to assess and modify interventions until short term weight is achieved;Long Term: Adherence to nutrition and physical activity/exercise program aimed toward attainment of established weight goal;Weight Maintenance: Understanding of the daily nutrition guidelines, which includes 25-35% calories from fat, 7% or less cal from saturated fats, less than 200mg  cholesterol, less than 1.5gm of sodium, & 5 or more servings of fruits and vegetables daily;Weight Loss: Understanding of general recommendations for a balanced deficit meal plan, which promotes 1-2 lb weight loss per week and includes a negative energy balance of [id number] kcal/d;Understanding recommendations for meals to include 15-35% energy as protein, 25-35% energy from fat, 35-60% energy from carbohydrates, less than 200mg  of dietary cholesterol, 20-35 gm of total fiber daily;Understanding of distribution of calorie intake throughout the day with the consumption of 4-5 meals/snacks    Improve shortness of breath with ADL's Yes    Intervention Provide education, individualized exercise plan and daily activity instruction to help decrease symptoms of SOB with activities of daily living.    Expected Outcomes Short Term: Improve cardiorespiratory fitness to achieve a reduction of symptoms when  performing ADLs;Long Term: Be able to  perform more ADLs without symptoms or delay the onset of symptoms    Diabetes Yes    Intervention Provide education about signs/symptoms and action to take for hypo/hyperglycemia.;Provide education about proper nutrition, including hydration, and aerobic/resistive exercise prescription along with prescribed medications to achieve blood glucose in normal ranges: Fasting glucose 65-99 mg/dL    Expected Outcomes Short Term: Participant verbalizes understanding of the signs/symptoms and immediate care of hyper/hypoglycemia, proper foot care and importance of medication, aerobic/resistive exercise and nutrition plan for blood glucose control.;Long Term: Attainment of HbA1C < 7%.    Hypertension Yes    Intervention Provide education on lifestyle modifcations including regular physical activity/exercise, weight management, moderate sodium restriction and increased consumption of fresh fruit, vegetables, and low fat dairy, alcohol moderation, and smoking cessation.;Monitor prescription use compliance.    Expected Outcomes Short Term: Continued assessment and intervention until BP is < 140/59mm HG in hypertensive participants. < 130/44mm HG in hypertensive participants with diabetes, heart failure or chronic kidney disease.;Long Term: Maintenance of blood pressure at goal levels.    Lipids Yes    Intervention Provide education and support for participant on nutrition & aerobic/resistive exercise along with prescribed medications to achieve LDL 70mg , HDL >40mg .    Expected Outcomes Short Term: Participant states understanding of desired cholesterol values and is compliant with medications prescribed. Participant is following exercise prescription and nutrition guidelines.;Long Term: Cholesterol controlled with medications as prescribed, with individualized exercise RX and with personalized nutrition plan. Value goals: LDL < 70mg , HDL > 40 mg.    Stress Yes    Intervention Offer individual and/or small group  education and counseling on adjustment to heart disease, stress management and health-related lifestyle change. Teach and support self-help strategies.;Refer participants experiencing significant psychosocial distress to appropriate mental health specialists for further evaluation and treatment. When possible, include family members and significant others in education/counseling sessions.    Expected Outcomes Short Term: Participant demonstrates changes in health-related behavior, relaxation and other stress management skills, ability to obtain effective social support, and compliance with psychotropic medications if prescribed.;Long Term: Emotional wellbeing is indicated by absence of clinically significant psychosocial distress or social isolation.           Core Components/Risk Factors/Patient Goals Review:   Goals and Risk Factor Review    Row Name 07/23/20 1717             Core Components/Risk Factors/Patient Goals Review   Personal Goals Review Weight Management/Obesity;Stress;Hypertension;Lipids;Diabetes;Improve shortness of breath with ADL's       Review issac is making slow progress with exercise. Miken's continues to have intermittent BP elevations. Dr Einar Gip recently increased his beta blocker       Expected Outcomes Abdullah will continue to participate in phase 2 cardiac rehab for exercise, nutrtion and lifestyle modifications              Core Components/Risk Factors/Patient Goals at Discharge (Final Review):   Goals and Risk Factor Review - 07/23/20 1717      Core Components/Risk Factors/Patient Goals Review   Personal Goals Review Weight Management/Obesity;Stress;Hypertension;Lipids;Diabetes;Improve shortness of breath with ADL's    Review issac is making slow progress with exercise. Billye's continues to have intermittent BP elevations. Dr Einar Gip recently increased his beta blocker    Expected Outcomes Rollie will continue to participate in phase 2 cardiac rehab for exercise,  nutrtion and lifestyle modifications           ITP Comments:  ITP  Comments    Row Name 06/25/20 1423 07/23/20 1713         ITP Comments Dr Fransico Him MD, Medical Director 30 Day ITP Review. Issac has good attendance and participation in phase 2 cardiac rehab for his fitness level             Comments: See ITP comments.Barnet Pall, RN,BSN 07/23/2020 5:21 PM

## 2020-07-24 ENCOUNTER — Encounter (HOSPITAL_COMMUNITY)
Admission: RE | Admit: 2020-07-24 | Discharge: 2020-07-24 | Disposition: A | Discharge status: Home / Self Care | Payer: Medicare Other | Source: Ambulatory Visit | Attending: Cardiology | Admitting: Cardiology

## 2020-07-24 ENCOUNTER — Other Ambulatory Visit: Payer: Self-pay

## 2020-07-24 DIAGNOSIS — Z7901 Long term (current) use of anticoagulants: Secondary | ICD-10-CM | POA: Diagnosis not present

## 2020-07-24 DIAGNOSIS — I214 Non-ST elevation (NSTEMI) myocardial infarction: Secondary | ICD-10-CM

## 2020-07-24 DIAGNOSIS — Z7982 Long term (current) use of aspirin: Secondary | ICD-10-CM | POA: Diagnosis not present

## 2020-07-24 DIAGNOSIS — Z951 Presence of aortocoronary bypass graft: Secondary | ICD-10-CM

## 2020-07-24 DIAGNOSIS — Z87891 Personal history of nicotine dependence: Secondary | ICD-10-CM | POA: Diagnosis not present

## 2020-07-24 DIAGNOSIS — Z79899 Other long term (current) drug therapy: Secondary | ICD-10-CM | POA: Diagnosis not present

## 2020-07-24 DIAGNOSIS — Z7902 Long term (current) use of antithrombotics/antiplatelets: Secondary | ICD-10-CM | POA: Diagnosis not present

## 2020-07-26 ENCOUNTER — Other Ambulatory Visit: Payer: Self-pay

## 2020-07-26 ENCOUNTER — Encounter (HOSPITAL_COMMUNITY)
Admission: RE | Admit: 2020-07-26 | Discharge: 2020-07-26 | Disposition: A | Discharge status: Home / Self Care | Payer: Medicare Other | Source: Ambulatory Visit | Attending: Cardiology | Admitting: Cardiology

## 2020-07-26 DIAGNOSIS — Z951 Presence of aortocoronary bypass graft: Secondary | ICD-10-CM | POA: Insufficient documentation

## 2020-07-26 DIAGNOSIS — I214 Non-ST elevation (NSTEMI) myocardial infarction: Secondary | ICD-10-CM | POA: Insufficient documentation

## 2020-07-26 NOTE — Progress Notes (Signed)
LVEF is improved and is now normal.

## 2020-07-29 ENCOUNTER — Other Ambulatory Visit: Payer: Self-pay

## 2020-07-29 ENCOUNTER — Encounter (HOSPITAL_COMMUNITY)
Admission: RE | Admit: 2020-07-29 | Discharge: 2020-07-29 | Disposition: A | Discharge status: Home / Self Care | Payer: Medicare Other | Source: Ambulatory Visit | Attending: Cardiology | Admitting: Cardiology

## 2020-07-29 DIAGNOSIS — I214 Non-ST elevation (NSTEMI) myocardial infarction: Secondary | ICD-10-CM

## 2020-07-29 DIAGNOSIS — N184 Chronic kidney disease, stage 4 (severe): Secondary | ICD-10-CM | POA: Diagnosis not present

## 2020-07-29 DIAGNOSIS — Z951 Presence of aortocoronary bypass graft: Secondary | ICD-10-CM | POA: Diagnosis not present

## 2020-07-29 NOTE — Progress Notes (Signed)
Called and spoke with patient regarding his echocardiogram results.  ?

## 2020-07-30 ENCOUNTER — Ambulatory Visit: Payer: Federal, State, Local not specified - PPO | Admitting: Cardiology

## 2020-07-30 ENCOUNTER — Ambulatory Visit (INDEPENDENT_AMBULATORY_CARE_PROVIDER_SITE_OTHER): Payer: Medicare Other | Admitting: Podiatry

## 2020-07-30 DIAGNOSIS — M79675 Pain in left toe(s): Secondary | ICD-10-CM

## 2020-07-30 DIAGNOSIS — E1142 Type 2 diabetes mellitus with diabetic polyneuropathy: Secondary | ICD-10-CM

## 2020-07-30 DIAGNOSIS — B351 Tinea unguium: Secondary | ICD-10-CM | POA: Diagnosis not present

## 2020-07-30 DIAGNOSIS — M79674 Pain in right toe(s): Secondary | ICD-10-CM

## 2020-07-30 DIAGNOSIS — D689 Coagulation defect, unspecified: Secondary | ICD-10-CM

## 2020-07-30 NOTE — Progress Notes (Signed)
Reviewed home exercise Rx with patient today. Pt is not doing any additional exercise at home so he was encouraged to walk. He was instructed that he could break the walks up into 5-10 minute session 2-3 x/day. Pt instructed to use his walker or cane for safety. Warm-up, cool-down, and stretching encouraged. Hydration encouraged. Reviewed tHRR of 59-118 and that he should keep his effort between 11-13 (RPE). Discussed weather parameters for temperature and humidity for safe exercise outdoors. Reviewed S/S to terminate activity and when to call MD vs 911. Reviewed use of NTG, Pt verbalized understanding of the home exercise Rx and was provided a copy.    Lesly Rubenstein MS, ACSM-CEP, CCRP

## 2020-07-30 NOTE — Progress Notes (Signed)
  Subjective:  Patient ID: Christian [name], male    DOB: 07-23-1945,  MRN: [id number]  75 y.o. male presents with at risk foot care with history of diabetic neuropathy and painful thick toenails that are difficult to trim. Pain interferes with ambulation. Aggravating factors include wearing enclosed shoe gear. Pain is relieved with periodic professional debridement..    Patient did not check blood glucose this morning and states he doesn't check it daily. His wife is present during today's visit. She is requesting medication list be updated.  PCP: Hoyt Koch, MD and last visit was: 03/25/2020. Cardiologist is Dr. Adrian Prows and last visit was 07/11/2020.  Review of Systems: Negative except as noted in the HPI.   No Known Allergies   Objective:  There were no vitals filed for this visit. Constitutional Patient is a pleasant 75 y.o. African American male in NAD. AAO x 3.  Vascular Capillary refill time to digits immediate b/l. Palpable PT pulse(s) b/l lower extremities Faintly palpable DP pulse(s) b/l lower extremities. Pedal hair sparse. Lower extremity skin temperature gradient within normal limits. Trace edema noted b/l lower extremities. No ischemia or gangrene noted b/l lower extremities. No cyanosis or clubbing noted.  Neurologic Normal speech. Protective sensation diminished with 10g monofilament b/l.  Dermatologic Pedal skin with normal turgor, texture and tone bilaterally. No open wounds bilaterally. No interdigital macerations bilaterally. Toenails 2-5 bilaterally and R hallux elongated, discolored, dystrophic, thickened, and crumbly with subungual debris and tenderness to dorsal palpation. Anonychia noted L hallux. Nailbed(s) epithelialized.   Orthopedic: Normal muscle strength 5/5 to all lower extremity muscle groups bilaterally. No pain crepitus or joint limitation noted with ROM b/l. No gross bony deformities bilaterally. Utilizes rollator for ambulation assistance.    Hemoglobin A1C Latest Ref Rng & Units 03/25/2020 12/20/2019 08/14/2019  HGBA1C 4.6 - 6.5 % 5.4 4.9 7.7(H)  Some recent data might be hidden   Assessment:   1. Pain due to onychomycosis of toenails of both feet   2. Coagulation disorder ([place])   3. Diabetic polyneuropathy associated with type 2 diabetes mellitus ([place])    Plan:  Patient was evaluated and treated and all questions answered.  Onychomycosis with pain -Nails palliatively debridement as below. -Educated on self-care  Procedure: Nail Debridement Rationale: Pain Type of Debridement: manual, sharp debridement. Instrumentation: Nail nipper, rotary burr. Number of Nails: 9  -Examined patient. -No new findings. No new orders. -Continue diabetic foot care principles. -Patient to continue soft, supportive shoe gear daily. -Toenails 2-5 bilaterally and R hallux debrided in length and girth without iatrogenic bleeding with sterile nail nipper and dremel.  -Patient to report any pedal injuries to medical professional immediately. -Patient/POA to call should there be question/concern in the interim.  Return in about 3 months (around 10/29/2020).  Marzetta Board, DPM

## 2020-07-31 ENCOUNTER — Other Ambulatory Visit: Payer: Self-pay

## 2020-07-31 ENCOUNTER — Encounter (HOSPITAL_COMMUNITY)
Admission: RE | Admit: 2020-07-31 | Discharge: 2020-07-31 | Disposition: A | Discharge status: Home / Self Care | Payer: Medicare Other | Source: Ambulatory Visit | Attending: Cardiology | Admitting: Cardiology

## 2020-07-31 ENCOUNTER — Ambulatory Visit: Payer: Federal, State, Local not specified - PPO | Admitting: Cardiology

## 2020-07-31 DIAGNOSIS — Z951 Presence of aortocoronary bypass graft: Secondary | ICD-10-CM

## 2020-07-31 DIAGNOSIS — I214 Non-ST elevation (NSTEMI) myocardial infarction: Secondary | ICD-10-CM | POA: Diagnosis not present

## 2020-08-02 ENCOUNTER — Other Ambulatory Visit: Payer: Self-pay

## 2020-08-02 ENCOUNTER — Encounter (HOSPITAL_COMMUNITY)
Admission: RE | Admit: 2020-08-02 | Discharge: 2020-08-02 | Disposition: A | Discharge status: Home / Self Care | Payer: Medicare Other | Source: Ambulatory Visit | Attending: Cardiology | Admitting: Cardiology

## 2020-08-02 DIAGNOSIS — Z951 Presence of aortocoronary bypass graft: Secondary | ICD-10-CM

## 2020-08-02 DIAGNOSIS — I214 Non-ST elevation (NSTEMI) myocardial infarction: Secondary | ICD-10-CM | POA: Diagnosis not present

## 2020-08-04 ENCOUNTER — Encounter: Payer: Self-pay | Admitting: Podiatry

## 2020-08-05 ENCOUNTER — Encounter (HOSPITAL_COMMUNITY)
Admission: RE | Admit: 2020-08-05 | Discharge: 2020-08-05 | Disposition: A | Discharge status: Home / Self Care | Payer: Medicare Other | Source: Ambulatory Visit | Attending: Cardiology | Admitting: Cardiology

## 2020-08-05 ENCOUNTER — Other Ambulatory Visit: Payer: Self-pay

## 2020-08-05 DIAGNOSIS — Z951 Presence of aortocoronary bypass graft: Secondary | ICD-10-CM

## 2020-08-05 DIAGNOSIS — I214 Non-ST elevation (NSTEMI) myocardial infarction: Secondary | ICD-10-CM

## 2020-08-07 ENCOUNTER — Other Ambulatory Visit: Payer: Self-pay

## 2020-08-07 ENCOUNTER — Encounter (HOSPITAL_COMMUNITY)
Admission: RE | Admit: 2020-08-07 | Discharge: 2020-08-07 | Disposition: A | Discharge status: Home / Self Care | Payer: Medicare Other | Source: Ambulatory Visit | Attending: Cardiology | Admitting: Cardiology

## 2020-08-07 DIAGNOSIS — I214 Non-ST elevation (NSTEMI) myocardial infarction: Secondary | ICD-10-CM | POA: Diagnosis not present

## 2020-08-07 DIAGNOSIS — Z951 Presence of aortocoronary bypass graft: Secondary | ICD-10-CM | POA: Diagnosis not present

## 2020-08-09 ENCOUNTER — Encounter (HOSPITAL_COMMUNITY)
Admission: RE | Admit: 2020-08-09 | Discharge: 2020-08-09 | Disposition: A | Discharge status: Home / Self Care | Payer: Medicare Other | Source: Ambulatory Visit | Attending: Cardiology | Admitting: Cardiology

## 2020-08-09 ENCOUNTER — Other Ambulatory Visit: Payer: Self-pay

## 2020-08-09 DIAGNOSIS — Z951 Presence of aortocoronary bypass graft: Secondary | ICD-10-CM

## 2020-08-09 DIAGNOSIS — I214 Non-ST elevation (NSTEMI) myocardial infarction: Secondary | ICD-10-CM

## 2020-08-12 ENCOUNTER — Other Ambulatory Visit: Payer: Self-pay

## 2020-08-12 ENCOUNTER — Encounter (HOSPITAL_COMMUNITY)
Admission: RE | Admit: 2020-08-12 | Discharge: 2020-08-12 | Disposition: A | Discharge status: Home / Self Care | Payer: Medicare Other | Source: Ambulatory Visit | Attending: Cardiology | Admitting: Cardiology

## 2020-08-12 DIAGNOSIS — I214 Non-ST elevation (NSTEMI) myocardial infarction: Secondary | ICD-10-CM | POA: Diagnosis not present

## 2020-08-12 DIAGNOSIS — N186 End stage renal disease: Secondary | ICD-10-CM | POA: Diagnosis not present

## 2020-08-12 DIAGNOSIS — Z951 Presence of aortocoronary bypass graft: Secondary | ICD-10-CM | POA: Diagnosis not present

## 2020-08-12 DIAGNOSIS — Z992 Dependence on renal dialysis: Secondary | ICD-10-CM | POA: Diagnosis not present

## 2020-08-12 DIAGNOSIS — R262 Difficulty in walking, not elsewhere classified: Secondary | ICD-10-CM | POA: Diagnosis not present

## 2020-08-14 ENCOUNTER — Encounter (HOSPITAL_COMMUNITY)
Admission: RE | Admit: 2020-08-14 | Discharge: 2020-08-14 | Disposition: A | Discharge status: Home / Self Care | Payer: Medicare Other | Source: Ambulatory Visit | Attending: Cardiology | Admitting: Cardiology

## 2020-08-14 ENCOUNTER — Telehealth: Payer: Self-pay

## 2020-08-14 ENCOUNTER — Other Ambulatory Visit: Payer: Self-pay

## 2020-08-14 DIAGNOSIS — I214 Non-ST elevation (NSTEMI) myocardial infarction: Secondary | ICD-10-CM | POA: Diagnosis not present

## 2020-08-14 DIAGNOSIS — Z951 Presence of aortocoronary bypass graft: Secondary | ICD-10-CM | POA: Diagnosis not present

## 2020-08-14 NOTE — Telephone Encounter (Signed)
Please let me know when med rec is done. Also please verify he is taking Lasix 80 mg BID.

## 2020-08-14 NOTE — Telephone Encounter (Signed)
Nurse from rehab called pt has gained 10 pounds, bp has beenin the 150's, chronic edema/swelling sob, nurse will have his wife call with medication reconciliation as pt does not know. RN will fax you as well .

## 2020-08-14 NOTE — Progress Notes (Signed)
Christian [name]'s weight is up 1.6 kg from 08/12/20. Christian [name] has gained a total of 4.6 kg since starting cardiac rehab on 07/01/20. Christian [name]'s systolic BP's continue to run in the 150's. Christian [name] reported feeling more short of breath today than he usually does. Oxygen saturation 97% on room air. Lung fields with diminished posterior bases. Left lower leg and ankle edema present. Dr Irven Shelling office called and notified about weight gain and continued systolic BP elevations. Christian [name] admits to be eating snack foods at home such as chips and ice cream. Christian [name] completed exercise today without difficulty. I went to the car and reviewed Christian [name]'s medication list with his wife. Will exercise flow sheets from cardiac rehab for Dr Celesta Gentile [facility] to review. Mrs Bodey says that Christian [name] has an appointment to see the nephrologist tomorrow.Christian Pall, RN,BSN 08/14/2020 3:21 PM

## 2020-08-14 NOTE — Telephone Encounter (Signed)
Patient apparently has appt with nephrology tomorrow. Please set him up to be seen by me next week, Thursday or Friday preferably.

## 2020-08-15 DIAGNOSIS — I129 Hypertensive chronic kidney disease with stage 1 through stage 4 chronic kidney disease, or unspecified chronic kidney disease: Secondary | ICD-10-CM | POA: Diagnosis not present

## 2020-08-15 DIAGNOSIS — I2510 Atherosclerotic heart disease of native coronary artery without angina pectoris: Secondary | ICD-10-CM | POA: Diagnosis not present

## 2020-08-15 DIAGNOSIS — N2581 Secondary hyperparathyroidism of renal origin: Secondary | ICD-10-CM | POA: Diagnosis not present

## 2020-08-15 DIAGNOSIS — E1122 Type 2 diabetes mellitus with diabetic chronic kidney disease: Secondary | ICD-10-CM | POA: Diagnosis not present

## 2020-08-15 DIAGNOSIS — N184 Chronic kidney disease, stage 4 (severe): Secondary | ICD-10-CM | POA: Diagnosis not present

## 2020-08-15 DIAGNOSIS — D631 Anemia in chronic kidney disease: Secondary | ICD-10-CM | POA: Diagnosis not present

## 2020-08-16 ENCOUNTER — Encounter (HOSPITAL_COMMUNITY)
Admission: RE | Admit: 2020-08-16 | Discharge: 2020-08-16 | Disposition: A | Discharge status: Home / Self Care | Payer: Medicare Other | Source: Ambulatory Visit | Attending: Cardiology | Admitting: Cardiology

## 2020-08-16 ENCOUNTER — Other Ambulatory Visit: Payer: Self-pay

## 2020-08-16 DIAGNOSIS — Z931 Gastrostomy status: Secondary | ICD-10-CM | POA: Diagnosis not present

## 2020-08-16 DIAGNOSIS — Z951 Presence of aortocoronary bypass graft: Secondary | ICD-10-CM

## 2020-08-16 DIAGNOSIS — I214 Non-ST elevation (NSTEMI) myocardial infarction: Secondary | ICD-10-CM | POA: Diagnosis not present

## 2020-08-16 DIAGNOSIS — I509 Heart failure, unspecified: Secondary | ICD-10-CM | POA: Diagnosis not present

## 2020-08-18 ENCOUNTER — Other Ambulatory Visit: Payer: Self-pay | Admitting: Cardiology

## 2020-08-18 DIAGNOSIS — I2510 Atherosclerotic heart disease of native coronary artery without angina pectoris: Secondary | ICD-10-CM

## 2020-08-19 ENCOUNTER — Encounter (HOSPITAL_COMMUNITY)
Admission: RE | Admit: 2020-08-19 | Discharge: 2020-08-19 | Disposition: A | Discharge status: Home / Self Care | Payer: Medicare Other | Source: Ambulatory Visit | Attending: Cardiology | Admitting: Cardiology

## 2020-08-19 ENCOUNTER — Other Ambulatory Visit: Payer: Self-pay

## 2020-08-19 DIAGNOSIS — Z951 Presence of aortocoronary bypass graft: Secondary | ICD-10-CM

## 2020-08-19 DIAGNOSIS — I214 Non-ST elevation (NSTEMI) myocardial infarction: Secondary | ICD-10-CM

## 2020-08-20 NOTE — Progress Notes (Signed)
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  aspirin EC 81 MG tablet, Take 81 mg by mouth daily., Disp: , Rfl:  .  atorvastatin (LIPITOR) 20 MG tablet, Take 1 tablet (20 mg total) by mouth every evening., Disp: 90 tablet, Rfl: 3 .  clonazePAM (KLONOPIN) 0.5 MG disintegrating tablet, TAKE 1 TABLET BY MOUTH TWICE A DAY, Disp: 60 tablet, Rfl: 5 .  clopidogrel (PLAVIX) 75 MG tablet, Take 1 tablet (75 mg total) by mouth daily., Disp: 90 tablet, Rfl: 3 .  cromolyn (OPTICROM) 4 % ophthalmic solution, SMARTSIG:1 Drop(s) In Eye(s) Every 6-12 Hours PRN, Disp: , Rfl:  .  [employer] ACETAMINOPHEN 325 MG tablet, TAKE 2 CAPS BY MOUTH EVERY 6 HOURS AS NEEDED FOR PAIN FOR TEMP 100F OR ABOVE, Disp: 60 tablet, Rfl: 0 .  [employer] SENNA PLUS 8.6-50 MG tablet, TAKE 2 TABLETS BY MOUTH AT BEDTIME FOR CONSTIPATION, Disp: 60 tablet, Rfl: 1 .  ferrous sulfate 325 (65 FE) MG tablet, Take 325 mg by mouth daily with breakfast., Disp: , Rfl:  .  furosemide (LASIX) 80 MG tablet, Take 80 mg by mouth 2 (two) times daily. Dose changed by nephrologist 05/09/20, Disp: , Rfl:  .  hydrALAZINE (APRESOLINE) 25 MG tablet, Take 1 tablet (25 mg total) by mouth 3 (three) times daily., Disp: 270 tablet, Rfl: 3 .  isosorbide dinitrate (ISORDIL) 30 MG tablet, TAKE 1 TABLET BY MOUTH 3 TIMES DAILY., Disp: 270 tablet, Rfl: 1 .  metoprolol succinate (TOPROL-XL) 200 MG 24 hr tablet, Take 1 tablet (200 mg total) by mouth daily.  Take with or immediately following a meal., Disp: 90 tablet, Rfl: 3 .  multivitamin (RENA-VIT) TABS tablet, Take 1 tablet by mouth at bedtime., Disp: , Rfl: 0 .  nitroGLYCERIN (NITROSTAT) 0.4 MG SL tablet, Place 0.4 mg under the tongue as directed., Disp: , Rfl:  .  oxycodone (OXY-IR) 5 MG capsule, Take 5 mg by mouth every 4 (four) hours as needed for pain., Disp: , Rfl:  .  PREVIDENT 5000 BOOSTER PLUS 1.1 % PSTE, Take 1 application by mouth daily. , Disp: , Rfl:  .  sertraline (ZOLOFT) 25 MG tablet, TAKE 1 TABLET ONE TIME A DAY, Disp: 90 tablet, Rfl: 1 .  silodosin (RAPAFLO) 8 MG CAPS capsule, Take 8 mg by mouth at bedtime., Disp: , Rfl:  .  Vitamin D, Ergocalciferol, (DRISDOL) 1.25 MG (50000 UNIT) CAPS capsule, Take 1 capsule (50,000 Units total) by mouth every 7 (seven) days., Disp: 12 capsule, Rfl: 3  Past Medical History: Past Medical History:  Diagnosis Date  . BPH (benign prostatic hypertrophy)   . Coronary artery disease   . Diabetes mellitus without complication ([place])   . Dry eyes left  . Hiatal hernia   . 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  LIMA TO LAD, RIMA TO PDA, RADIAL ARTERY TO CIRC AND SEQUENTIALLY TO OM1. 08/15/2019  . Hyperlipidemia   .  Hypertension   . Incomplete bladder emptying   . Nocturia   . 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 SOB (shortness of breath) on exertion     Tobacco Use: Social History   Tobacco Use  Smoking Status Former Smoker  . Packs/day: 0.50  . Years: 20.00  . Pack years: 10.00  . Types: Cigarettes  . Quit date: 44  . Years since quitting: 47.3  Smokeless Tobacco Never Used    Labs: Recent Chemical engineer    Labs for ITP Cardiac and Pulmonary Rehab Latest Ref Rng & Units 09/01/2019 09/01/2019 09/07/2019 12/20/2019 03/25/2020   Cholestrol 0 - 200 mg/dL - - - - -   LDLCALC 0 - 99 mg/dL - -  - - -   LDLDIRECT mg/dL - - - - -   HDL >40 mg/dL - - - - -   Trlycerides <150 mg/dL - - - - -   Hemoglobin A1c 4.6 - 6.5 % - - - 4.9 5.4   PHART 7.350 - 7.450 - - 7.481(H) - -   PCO2ART 32.0 - 48.0 mmHg - - 31.4(L) - -   HCO3 20.0 - 28.0 mmol/L - - 23.3 - -   TCO2 22 - 32 mmol/L - - 24 - -   ACIDBASEDEF 0.0 - 2.0 mmol/L - - - - -   O2SAT % 39.4 55.6 99.0 - -      Capillary Blood Glucose: Lab Results  Component Value Date   GLUCAP 136 (H) 07/01/2020   GLUCAP 143 (H) 07/01/2020   GLUCAP 200 (H) 10/18/2019   GLUCAP 276 (H) 10/18/2019   GLUCAP 145 (H) 10/18/2019     Exercise Target Goals: Exercise Program Goal: Individual exercise prescription set using results from initial 6 min walk test and THRR while considering  patient's activity barriers and safety.   Exercise Prescription Goal: Starting with aerobic activity 30 plus minutes a day, 3 days per week for initial exercise prescription. Provide home exercise prescription and guidelines that participant acknowledges understanding prior to discharge.  Activity Barriers & Risk Stratification:  Activity Barriers & Cardiac Risk Stratification - 06/25/20 1339      Activity Barriers & Cardiac Risk Stratification   Activity Barriers Back Problems;Deconditioning;Muscular Weakness;Shortness of Breath;Balance Concerns;History of Falls;Assistive Device    Cardiac Risk Stratification High           6 Minute Walk:  6 Minute Walk    Row Name 06/25/20 1337         6 Minute Walk   Phase Initial     Distance 680 feet     Walk Time 6 minutes     # of Rest Breaks 0     MPH 1.29     METS 1.92     RPE 9     Perceived Dyspnea  3     VO2 Peak 6.71     Symptoms Yes (comment)     Comments SOB, RPD = 3     Resting HR 99 bpm     Resting BP 144/84     Resting Oxygen Saturation  97 %     Exercise Oxygen Saturation  during 6 min walk 96 %     Max Ex. HR 115 bpm     Max Ex. BP 170/94     2 Minute Post BP 158/96  Oxygen Initial Assessment:   Oxygen Re-Evaluation:   Oxygen Discharge (Final Oxygen Re-Evaluation):   Initial Exercise Prescription:  Initial Exercise Prescription - 06/25/20 1300      Date of Initial Exercise RX and Referring Provider   Date 06/25/20    Referring Provider Adrian Prows, MD    Expected Discharge Date 08/23/20      NuStep   Level 1    SPM 75    Minutes 25    METs 1.8      Prescription Details   Frequency (times per week) 3    Duration Progress to 30 minutes of continuous aerobic without signs/symptoms of physical distress      Intensity   THRR 40-80% of Max Heartrate 59-118    Ratings of Perceived Exertion 11-13    Perceived Dyspnea 0-4      Progression   Progression Continue progressive overload as per policy without signs/symptoms or physical distress.      Resistance Training   Training Prescription Yes    Weight 2 lbs    Reps 10-15           Perform Capillary Blood Glucose checks as needed.  Exercise Prescription Changes:  Exercise Prescription Changes    Row Name 07/01/20 1600 07/15/20 1450 07/26/20 1500 08/14/20 1514       Response to Exercise   Blood Pressure (Admit) 146/78 156/84 154/78 150/80    Blood Pressure (Exercise) 130/62 146/82 134/72 156/80    Blood Pressure (Exit) 124/70 142/88 140/76 150/86    Heart Rate (Admit) 98 bpm 86 bpm 94 bpm 88 bpm    Heart Rate (Exercise) 110 bpm 105 bpm 96 bpm 90 bpm    Heart Rate (Exit) 97 bpm 89 bpm 87 bpm 81 bpm    Rating of Perceived Exertion (Exercise) 7 9 12 11     Symptoms None None None None    Comments Pt's first day of exercise in the CRP2 program Reviewed METs Reviewed Home exercise Rx Reviewed METs    Duration Progress to 30 minutes of  aerobic without signs/symptoms of physical distress Continue with 30 min of aerobic exercise without signs/symptoms of physical distress. Continue with 30 min of aerobic exercise without signs/symptoms of physical distress. Continue with 30 min of  aerobic exercise without signs/symptoms of physical distress.    Intensity THRR unchanged THRR unchanged THRR unchanged THRR unchanged         Progression   Progression Continue to progress workloads to maintain intensity without signs/symptoms of physical distress. Continue to progress workloads to maintain intensity without signs/symptoms of physical distress. Continue to progress workloads to maintain intensity without signs/symptoms of physical distress. Continue to progress workloads to maintain intensity without signs/symptoms of physical distress.    Average METs -- 1.7 1.5 1.5         Resistance Training   Training Prescription Yes No Yes No    Weight 2 lbs -- 2 lbs --    Reps 10-15 -- 10-15 --    Time 10 Minutes -- 10 Minutes --         Interval Training   Interval Training No No No No         NuStep   Level 1 3 3 4     SPM 75 70 70 65    Minutes 25 30 30 30     METs --  not obtained 1.7 1.5 1.5         Home Exercise Plan   Plans to continue  exercise at -- -- Home (comment) Home (comment)    Frequency -- -- Add 2 additional days to program exercise sessions. Add 2 additional days to program exercise sessions.    Initial Home Exercises Provided -- -- 07/26/20 07/26/20           Exercise Comments:  Exercise Comments    Row Name 07/01/20 1615 07/15/20 1500 07/26/20 1345 08/14/20 1517     Exercise Comments Pt's first day in the CRP2 program. Pt tolerated session well. Reviewed METS. Pt making slow progress. Pt demonstates good attendence to the CRP2 program. Reviewed home exercise Rx. Pt verbalized understanding ad was provided a copy. Reviewed METs. Pt progress at [place]. Pt not consistantly walking at home. Continue to encourage walking at home.           Exercise Goals and Review:  Exercise Goals    Row Name 06/25/20 1344             Exercise Goals   Increase Physical Activity Yes       Intervention Provide advice, education, support and counseling  about physical activity/exercise needs.;Develop an individualized exercise prescription for aerobic and resistive training based on initial evaluation findings, risk stratification, comorbidities and participant's personal goals.       Expected Outcomes Short Term: Attend rehab on a regular basis to increase amount of physical activity.;Long Term: Add in home exercise to make exercise part of routine and to increase amount of physical activity.;Long Term: Exercising regularly at least 3-5 days a week.       Increase Strength and Stamina Yes       Intervention Provide advice, education, support and counseling about physical activity/exercise needs.;Develop an individualized exercise prescription for aerobic and resistive training based on initial evaluation findings, risk stratification, comorbidities and participant's personal goals.       Expected Outcomes Short Term: Increase workloads from initial exercise prescription for resistance, speed, and METs.;Short Term: Perform resistance training exercises routinely during rehab and add in resistance training at home;Long Term: Improve cardiorespiratory fitness, muscular endurance and strength as measured by increased METs and functional capacity (6MWT)       Able to understand and use rate of perceived exertion (RPE) scale Yes       Intervention Provide education and explanation on how to use RPE scale       Expected Outcomes Short Term: Able to use RPE daily in rehab to express subjective intensity level;Long Term:  Able to use RPE to guide intensity level when exercising independently       Able to understand and use Dyspnea scale Yes       Intervention Provide education and explanation on how to use Dyspnea scale       Expected Outcomes Short Term: Able to use Dyspnea scale daily in rehab to express subjective sense of shortness of breath during exertion;Long Term: Able to use Dyspnea scale to guide intensity level when exercising independently        Knowledge and understanding of Target Heart Rate Range (THRR) Yes       Intervention Provide education and explanation of THRR including how the numbers were predicted and where they are located for reference       Expected Outcomes Short Term: Able to state/look up THRR;Short Term: Able to use daily as guideline for intensity in rehab;Long Term: Able to use THRR to govern intensity when exercising independently       Understanding of Exercise Prescription Yes  Intervention Provide education, explanation, and written materials on patient's individual exercise prescription       Expected Outcomes Short Term: Able to explain program exercise prescription;Long Term: Able to explain home exercise prescription to exercise independently              Exercise Goals Re-Evaluation :  Exercise Goals Re-Evaluation    Row Name 07/01/20 1614 07/26/20 1345 07/30/20 0958         Exercise Goal Re-Evaluation   Exercise Goals Review Increase Physical Activity;Increase Strength and Stamina;Able to understand and use rate of perceived exertion (RPE) scale;Knowledge and understanding of Target Heart Rate Range (THRR);Understanding of Exercise Prescription Increase Physical Activity;Increase Strength and Stamina;Able to understand and use rate of perceived exertion (RPE) scale;Knowledge and understanding of Target Heart Rate Range (THRR);Understanding of Exercise Prescription --     Comments Pt's first day of exercise in the CRP2 program. Pt understands the exercise Rx, RPE scale, and THRR. Reviewed home exercise Rx. Pt is not doing any additional exercise at home. Encouraged to break times up into 5-10 minutes thoughout the day; 2-3x/week. Encouraged to use walker or cane for safety. --     Expected Outcomes Will continue to monitor patient and progress exercise workloads as tolerated. Pt will walk at home 2-3 x/week for a total of 30 minutes. --             Discharge Exercise Prescription (Final Exercise  Prescription Changes):  Exercise Prescription Changes - 08/14/20 1514      Response to Exercise   Blood Pressure (Admit) 150/80    Blood Pressure (Exercise) 156/80    Blood Pressure (Exit) 150/86    Heart Rate (Admit) 88 bpm    Heart Rate (Exercise) 90 bpm    Heart Rate (Exit) 81 bpm    Rating of Perceived Exertion (Exercise) 11    Symptoms None    Comments Reviewed METs    Duration Continue with 30 min of aerobic exercise without signs/symptoms of physical distress.    Intensity THRR unchanged      Progression   Progression Continue to progress workloads to maintain intensity without signs/symptoms of physical distress.    Average METs 1.5      Resistance Training   Training Prescription No      Interval Training   Interval Training No      NuStep   Level 4    SPM 65    Minutes 30    METs 1.5      Home Exercise Plan   Plans to continue exercise at Home (comment)    Frequency Add 2 additional days to program exercise sessions.    Initial Home Exercises Provided 07/26/20           Nutrition:  Target Goals: Understanding of nutrition guidelines, daily intake of sodium 1500mg , cholesterol 200mg , calories 30% from fat and 7% or less from saturated fats, daily to have 5 or more servings of fruits and vegetables.  Biometrics:  Pre Biometrics - 06/25/20 1045      Pre Biometrics   Waist Circumference 44 inches    Hip Circumference 46 inches    Waist to Hip Ratio 0.96 %    Triceps Skinfold 20 mm    % Body Fat 66 %    Grip Strength 27 kg    Flexibility 0 in   could not do   Single Leg Stand --   not done. H/O falls, assistive decvie.  Nutrition Therapy Plan and Nutrition Goals:  Nutrition Therapy & Goals - 07/17/20 1425      Nutrition Therapy   Diet TLC; low sodium    Drug/Food Interactions Statins/Certain Fruits      Personal Nutrition Goals   Nutrition Goal Reduce sodium intake to <2000 mg/day by reading labels and avoiding fast food     Personal Goal #2 Pt to reduce intake of soda, tea, and coffee      Intervention Plan   Intervention Prescribe, educate and counsel regarding individualized specific dietary modifications aiming towards targeted core components such as weight, hypertension, lipid management, diabetes, heart failure and other comorbidities.;Nutrition handout(s) given to patient.    Expected Outcomes Short Term Goal: Understand basic principles of dietary content, such as calories, fat, sodium, cholesterol and nutrients.;Long Term Goal: Adherence to prescribed nutrition plan.           Nutrition Assessments:  MEDIFICTS Score Key:  ?70 Need to make dietary changes   40-70 Heart Healthy Diet  ? 40 Therapeutic Level Cholesterol Diet  Flowsheet Row CARDIAC REHAB PHASE II EXERCISE from 07/17/2020 in [place]  Picture Your Plate Total Score on Admission 72     Picture Your Plate Scores:  <78 Unhealthy dietary pattern with much room for improvement.  41-50 Dietary pattern unlikely to meet recommendations for good health and room for improvement.  51-60 More healthful dietary pattern, with some room for improvement.   >60 Healthy dietary pattern, although there may be some specific behaviors that could be improved.    Nutrition Goals Re-Evaluation:  Nutrition Goals Re-Evaluation    [place] Name 07/17/20 1426 08/16/20 1519           Goals   Current Weight 196 lb (88.9 kg) 194 lb 10.7 oz (88.3 kg)      Nutrition Goal Reduce sodium intake to <2000 mg/day by reading labels and avoiding fast food Reduce sodium intake to <2000 mg/day by reading labels and avoiding fast food             Personal Goal #2 Re-Evaluation   Personal Goal #2 Pt to reduce intake of soda, tea, and coffee Pt to reduce intake of soda, tea, and coffee             Nutrition Goals Discharge (Final Nutrition Goals Re-Evaluation):  Nutrition Goals Re-Evaluation - 08/16/20 1519      Goals    Current Weight 194 lb 10.7 oz (88.3 kg)    Nutrition Goal Reduce sodium intake to <2000 mg/day by reading labels and avoiding fast food      Personal Goal #2 Re-Evaluation   Personal Goal #2 Pt to reduce intake of soda, tea, and coffee           Psychosocial: Target Goals: Acknowledge presence or absence of significant depression and/or stress, maximize coping skills, provide positive support system. Participant is able to verbalize types and ability to use techniques and skills needed for reducing stress and depression.  Initial Review & Psychosocial Screening:  Initial Psych Review & Screening - 06/25/20 1428      Initial Review   Current issues with Current Stress Concerns;Current Anxiety/Panic    Source of Stress Concerns Family;Chronic Illness    Comments Johnn is recovering from a lengthy hospitalization and recovery. Derrien has anxiety that is controlled.      Family Dynamics   Good Support System? Yes   Issac has his wife for support.     Barriers  Psychosocial barriers to participate in program The patient should benefit from training in stress management and relaxation.      Screening Interventions   Interventions To provide support and resources with identified psychosocial needs    Expected Outcomes Long Term Goal: Stressors or current issues are controlled or eliminated.;Short Term goal: Identification and review with participant of any Quality of Life or Depression concerns found by scoring the questionnaire.           Quality of Life Scores:  Quality of Life - 06/25/20 1333      Quality of Life   Select Quality of Life      Quality of Life Scores   Health/Function Pre 14.89 %    Socioeconomic Pre 12.7 %    Psych/Spiritual Pre 16.93 %    Family Pre 14 %    GLOBAL Pre 14.85 %          Scores of 19 and below usually indicate a poorer quality of life in these areas.  A difference of  2-3 points is a clinically meaningful difference.  A difference of 2-3  points in the total score of the Quality of Life Index has been associated with significant improvement in overall quality of life, self-image, physical symptoms, and general health in studies assessing change in quality of life.  PHQ-9: Recent Review Flowsheet Data    Depression screen [facility] 2/9 06/25/2020 06/25/2020 05/28/2020 11/23/2018 10/18/2017   Decreased Interest 0 0 0 0 0   Down, Depressed, Hopeless 0 0 0 0 0   PHQ - 2 Score 0 0 0 0 0     Interpretation of Total Score  Total Score Depression Severity:  1-4 = Minimal depression, 5-9 = Mild depression, 10-14 = Moderate depression, 15-19 = Moderately severe depression, 20-27 = Severe depression   Psychosocial Evaluation and Intervention:   Psychosocial Re-Evaluation:  Psychosocial Re-Evaluation    [place] Name 07/23/20 1714 08/20/20 1523           Psychosocial Re-Evaluation   Current issues with Current Anxiety/Panic;Current Stress Concerns Current Anxiety/Panic;Current Stress Concerns      Comments Dewaun has not voiced any increased concerns or stressors  or anxietysince participating in phase 2 cardiac rehab Camdon continues to have health stressors due to his renal disease. Have spoken to both the patient and his wife regarding this.      Expected Outcomes Sasan will have decreased stress and anxiety upon completion of phase 2 cardiac rehab Zyad will have decreased stress and anxiety upon completion of phase 2 cardiac rehab      Interventions Encouraged to attend Cardiac Rehabilitation for the exercise;Stress management education Encouraged to attend Cardiac Rehabilitation for the exercise;Stress management education      Continue Psychosocial Services  No Follow up required No Follow up required      Comments Will continue to offer support as needed Will continue to offer support as needed             Initial Review   Source of Stress Concerns Chronic Illness;Unable to participate in former interests or hobbies;Unable to perform  yard/household activities Chronic Illness;Unable to participate in former interests or hobbies;Unable to perform yard/household activities             Psychosocial Discharge (Final Psychosocial Re-Evaluation):  Psychosocial Re-Evaluation - 08/20/20 1523      Psychosocial Re-Evaluation   Current issues with Current Anxiety/Panic;Current Stress Concerns    Comments Odarius continues to have health stressors due to  his renal disease. Have spoken to both the patient and his wife regarding this.    Expected Outcomes Toris will have decreased stress and anxiety upon completion of phase 2 cardiac rehab    Interventions Encouraged to attend Cardiac Rehabilitation for the exercise;Stress management education    Continue Psychosocial Services  No Follow up required    Comments Will continue to offer support as needed      Initial Review   Source of Stress Concerns Chronic Illness;Unable to participate in former interests or hobbies;Unable to perform yard/household activities           Vocational Rehabilitation: Provide vocational rehab assistance to qualifying candidates.   Vocational Rehab Evaluation & Intervention:  Vocational Rehab - 06/25/20 1438      Initial Vocational Rehab Evaluation & Intervention   Assessment shows need for Vocational Rehabilitation No   Bria is retired and does not need vocatinal rehab at this time.          Education: Education Goals: Education classes will be provided on a weekly basis, covering required topics. Participant will state understanding/return demonstration of topics presented.  Learning Barriers/Preferences:  Learning Barriers/Preferences - 06/25/20 1334      Learning Barriers/Preferences   Learning Barriers Sight   wears glasses   Learning Preferences Audio;Computer/Internet;Group Instruction;Pictoral;Skilled Demonstration;Individual Instruction;Verbal Instruction;Video;Written Material           Education Topics: Hypertension,  Hypertension Reduction -Define heart disease and high blood pressure. Discus how high blood pressure affects the body and ways to reduce high blood pressure.   Exercise and Your Heart -Discuss why it is important to exercise, the FITT principles of exercise, normal and abnormal responses to exercise, and how to exercise safely.   Angina -Discuss definition of angina, causes of angina, treatment of angina, and how to decrease risk of having angina.   Cardiac Medications -Review what the following cardiac medications are used for, how they affect the body, and side effects that may occur when taking the medications.  Medications include Aspirin, Beta blockers, calcium channel blockers, ACE Inhibitors, angiotensin receptor blockers, diuretics, digoxin, and antihyperlipidemics.   Congestive Heart Failure -Discuss the definition of CHF, how to live with CHF, the signs and symptoms of CHF, and how keep track of weight and sodium intake.   Heart Disease and Intimacy -Discus the effect sexual activity has on the heart, how changes occur during intimacy as we age, and safety during sexual activity.   Smoking Cessation / COPD -Discuss different methods to quit smoking, the health benefits of quitting smoking, and the definition of COPD.   Nutrition I: Fats -Discuss the types of cholesterol, what cholesterol does to the heart, and how cholesterol levels can be controlled.   Nutrition II: Labels -Discuss the different components of food labels and how to read food label   Heart Parts/Heart Disease and PAD -Discuss the anatomy of the heart, the pathway of blood circulation through the heart, and these are affected by heart disease.   Stress I: Signs and Symptoms -Discuss the causes of stress, how stress may lead to anxiety and depression, and ways to limit stress.   Stress II: Relaxation -Discuss different types of relaxation techniques to limit stress.   Warning Signs of Stroke /  TIA -Discuss definition of a stroke, what the signs and symptoms are of a stroke, and how to identify when someone is having stroke.   Knowledge Questionnaire Score:  Knowledge Questionnaire Score - 06/25/20 1336  Knowledge Questionnaire Score   Pre Score 15/24           Core Components/Risk Factors/Patient Goals at Admission:  Personal Goals and Risk Factors at Admission - 06/25/20 1336      Core Components/Risk Factors/Patient Goals on Admission    Weight Management Yes;Obesity;Weight Loss    Intervention Weight Management: Develop a combined nutrition and exercise program designed to reach desired caloric intake, while maintaining appropriate intake of nutrient and fiber, sodium and fats, and appropriate energy expenditure required for the weight goal.;Weight Management: Provide education and appropriate resources to help participant work on and attain dietary goals.;Weight Management/Obesity: Establish reasonable short term and long term weight goals.;Obesity: Provide education and appropriate resources to help participant work on and attain dietary goals.    Admit Weight 199 lb 11.8 oz (90.6 kg)    Expected Outcomes Short Term: Continue to assess and modify interventions until short term weight is achieved;Long Term: Adherence to nutrition and physical activity/exercise program aimed toward attainment of established weight goal;Weight Maintenance: Understanding of the daily nutrition guidelines, which includes 25-35% calories from fat, 7% or less cal from saturated fats, less than 200mg  cholesterol, less than 1.5gm of sodium, & 5 or more servings of fruits and vegetables daily;Weight Loss: Understanding of general recommendations for a balanced deficit meal plan, which promotes 1-2 lb weight loss per week and includes a negative energy balance of [id number] kcal/d;Understanding recommendations for meals to include 15-35% energy as protein, 25-35% energy from fat, 35-60% energy from  carbohydrates, less than 200mg  of dietary cholesterol, 20-35 gm of total fiber daily;Understanding of distribution of calorie intake throughout the day with the consumption of 4-5 meals/snacks    Improve shortness of breath with ADL's Yes    Intervention Provide education, individualized exercise plan and daily activity instruction to help decrease symptoms of SOB with activities of daily living.    Expected Outcomes Short Term: Improve cardiorespiratory fitness to achieve a reduction of symptoms when performing ADLs;Long Term: Be able to perform more ADLs without symptoms or delay the onset of symptoms    Diabetes Yes    Intervention Provide education about signs/symptoms and action to take for hypo/hyperglycemia.;Provide education about proper nutrition, including hydration, and aerobic/resistive exercise prescription along with prescribed medications to achieve blood glucose in normal ranges: Fasting glucose 65-99 mg/dL    Expected Outcomes Short Term: Participant verbalizes understanding of the signs/symptoms and immediate care of hyper/hypoglycemia, proper foot care and importance of medication, aerobic/resistive exercise and nutrition plan for blood glucose control.;Long Term: Attainment of HbA1C < 7%.    Hypertension Yes    Intervention Provide education on lifestyle modifcations including regular physical activity/exercise, weight management, moderate sodium restriction and increased consumption of fresh fruit, vegetables, and low fat dairy, alcohol moderation, and smoking cessation.;Monitor prescription use compliance.    Expected Outcomes Short Term: Continued assessment and intervention until BP is < 140/98mm HG in hypertensive participants. < 130/58mm HG in hypertensive participants with diabetes, heart failure or chronic kidney disease.;Long Term: Maintenance of blood pressure at goal levels.    Lipids Yes    Intervention Provide education and support for participant on nutrition &  aerobic/resistive exercise along with prescribed medications to achieve LDL 70mg , HDL >40mg .    Expected Outcomes Short Term: Participant states understanding of desired cholesterol values and is compliant with medications prescribed. Participant is following exercise prescription and nutrition guidelines.;Long Term: Cholesterol controlled with medications as prescribed, with individualized exercise RX and with personalized nutrition plan. Value goals:  LDL < 70mg , HDL > 40 mg.    Stress Yes    Intervention Offer individual and/or small group education and counseling on adjustment to heart disease, stress management and health-related lifestyle change. Teach and support self-help strategies.;Refer participants experiencing significant psychosocial distress to appropriate mental health specialists for further evaluation and treatment. When possible, include family members and significant others in education/counseling sessions.    Expected Outcomes Short Term: Participant demonstrates changes in health-related behavior, relaxation and other stress management skills, ability to obtain effective social support, and compliance with psychotropic medications if prescribed.;Long Term: Emotional wellbeing is indicated by absence of clinically significant psychosocial distress or social isolation.           Core Components/Risk Factors/Patient Goals Review:   Goals and Risk Factor Review    Row Name 07/23/20 1717 08/20/20 1525           Core Components/Risk Factors/Patient Goals Review   Personal Goals Review Weight Management/Obesity;Stress;Hypertension;Lipids;Diabetes;Improve shortness of breath with ADL's Weight Management/Obesity;Stress;Hypertension;Lipids;Diabetes;Improve shortness of breath with ADL's      Review issac is making slow progress with exercise. Dalon's continues to have intermittent BP elevations. Dr Einar Gip recently increased his beta blocker issac is making slow progress with exercise.  Rockney's blood pressures have been forwarded to Dr Irven Shelling office as Mr Goin continues to be moderately elevated at times. Hermann has also gained weight. Zaroxlyn was added by his nephrologist. Will continue to monitor.      Expected Outcomes Nyzir will continue to participate in phase 2 cardiac rehab for exercise, nutrtion and lifestyle modifications Nijee will continue to participate in phase 2 cardiac rehab for exercise, nutrtion and lifestyle modifications             Core Components/Risk Factors/Patient Goals at Discharge (Final Review):   Goals and Risk Factor Review - 08/20/20 1525      Core Components/Risk Factors/Patient Goals Review   Personal Goals Review Weight Management/Obesity;Stress;Hypertension;Lipids;Diabetes;Improve shortness of breath with ADL's    Review issac is making slow progress with exercise. Sayed's blood pressures have been forwarded to Dr Irven Shelling office as Mr Nixon continues to be moderately elevated at times. Rusty has also gained weight. Zaroxlyn was added by his nephrologist. Will continue to monitor.    Expected Outcomes Khalee will continue to participate in phase 2 cardiac rehab for exercise, nutrtion and lifestyle modifications           ITP Comments:  ITP Comments    Row Name 06/25/20 1423 07/23/20 1713 08/20/20 1523       ITP Comments Dr Fransico Him MD, Medical Director 30 Day ITP Review. Issac has good attendance and participation in phase 2 cardiac rehab for his fitness level 30 Day ITP Review. Issac continues to have  good attendance and participation in phase 2 cardiac rehab for his fitness level            Comments: See ITP comments.Barnet Pall, RN,BSN 08/20/2020 3:29 PM

## 2020-08-21 ENCOUNTER — Other Ambulatory Visit: Payer: Self-pay

## 2020-08-21 ENCOUNTER — Telehealth: Payer: Self-pay | Admitting: Pharmacist

## 2020-08-21 ENCOUNTER — Encounter (HOSPITAL_COMMUNITY)
Admission: RE | Admit: 2020-08-21 | Discharge: 2020-08-21 | Disposition: A | Discharge status: Home / Self Care | Payer: Medicare Other | Source: Ambulatory Visit | Attending: Cardiology | Admitting: Cardiology

## 2020-08-21 DIAGNOSIS — I214 Non-ST elevation (NSTEMI) myocardial infarction: Secondary | ICD-10-CM | POA: Diagnosis not present

## 2020-08-21 DIAGNOSIS — Z951 Presence of aortocoronary bypass graft: Secondary | ICD-10-CM

## 2020-08-23 ENCOUNTER — Encounter (HOSPITAL_COMMUNITY)
Admission: RE | Admit: 2020-08-23 | Discharge: 2020-08-23 | Disposition: A | Discharge status: Home / Self Care | Payer: Medicare Other | Source: Ambulatory Visit | Attending: Cardiology | Admitting: Cardiology

## 2020-08-23 ENCOUNTER — Other Ambulatory Visit: Payer: Self-pay

## 2020-08-23 DIAGNOSIS — Z951 Presence of aortocoronary bypass graft: Secondary | ICD-10-CM

## 2020-08-23 DIAGNOSIS — I214 Non-ST elevation (NSTEMI) myocardial infarction: Secondary | ICD-10-CM

## 2020-08-23 NOTE — Progress Notes (Signed)
    Chronic Care Management Pharmacy Assistant   Name: SKYLIER KRETSCHMER  MRN: [id number] DOB: 11/21/1945   Reason for Encounter: Chart Review    Medications: Outpatient Encounter Medications as of 08/21/2020  Medication Sig Note  . aspirin EC 81 MG tablet Take 81 mg by mouth daily.   Marland Kitchen atorvastatin (LIPITOR) 20 MG tablet Take 1 tablet (20 mg total) by mouth every evening.   . clonazePAM (KLONOPIN) 0.5 MG disintegrating tablet TAKE 1 TABLET BY MOUTH TWICE A DAY   . clopidogrel (PLAVIX) 75 MG tablet Take 1 tablet (75 mg total) by mouth daily.   . cromolyn (OPTICROM) 4 % ophthalmic solution SMARTSIG:1 Drop(s) In Eye(s) Every 6-12 Hours PRN   . [facility] ACETAMINOPHEN 325 MG tablet TAKE 2 CAPS BY MOUTH EVERY 6 HOURS AS NEEDED FOR PAIN FOR TEMP 100F OR ABOVE   . [facility] SENNA PLUS 8.6-50 MG tablet TAKE 2 TABLETS BY MOUTH AT BEDTIME FOR CONSTIPATION   . ferrous sulfate 325 (65 FE) MG tablet Take 325 mg by mouth daily with breakfast.   . furosemide (LASIX) 80 MG tablet Take 80 mg by mouth 2 (two) times daily. Dose changed by nephrologist 05/09/20   . hydrALAZINE (APRESOLINE) 25 MG tablet Take 1 tablet (25 mg total) by mouth 3 (three) times daily.   . isosorbide dinitrate (ISORDIL) 30 MG tablet TAKE 1 TABLET BY MOUTH 3 TIMES DAILY.   . metoprolol succinate (TOPROL-XL) 200 MG 24 hr tablet Take 1 tablet (200 mg total) by mouth daily. Take with or immediately following a meal.   . multivitamin (RENA-VIT) TABS tablet Take 1 tablet by mouth at bedtime.   . nitroGLYCERIN (NITROSTAT) 0.4 MG SL tablet Place 0.4 mg under the tongue as directed.   Marland Kitchen oxycodone (OXY-IR) 5 MG capsule Take 5 mg by mouth every 4 (four) hours as needed for pain. 04/12/2020: Pt uses very infrequently, every few weeks  . PREVIDENT 5000 BOOSTER PLUS 1.1 % PSTE Take 1 application by mouth daily.    . sertraline (ZOLOFT) 25 MG tablet TAKE 1 TABLET ONE TIME A DAY   . silodosin (RAPAFLO) 8 MG CAPS capsule Take 8 mg by mouth at bedtime.   .  Vitamin D, Ergocalciferol, (DRISDOL) 1.25 MG (50000 UNIT) CAPS capsule Take 1 capsule (50,000 Units total) by mouth every 7 (seven) days.    No facility-administered encounter medications on file as of 08/21/2020.    Unable to reach patient for hypertension adherence call     Ethelene Hal Clinical Pharmacist Assistant [phone]  Time spent:7

## 2020-08-26 ENCOUNTER — Other Ambulatory Visit: Payer: Self-pay

## 2020-08-26 ENCOUNTER — Encounter (HOSPITAL_COMMUNITY)
Admission: RE | Admit: 2020-08-26 | Discharge: 2020-08-26 | Disposition: A | Discharge status: Home / Self Care | Payer: Medicare Other | Source: Ambulatory Visit | Attending: Cardiology | Admitting: Cardiology

## 2020-08-26 DIAGNOSIS — I214 Non-ST elevation (NSTEMI) myocardial infarction: Secondary | ICD-10-CM | POA: Diagnosis not present

## 2020-08-26 DIAGNOSIS — Z951 Presence of aortocoronary bypass graft: Secondary | ICD-10-CM

## 2020-08-27 DIAGNOSIS — E8770 Fluid overload, unspecified: Secondary | ICD-10-CM | POA: Diagnosis not present

## 2020-08-28 ENCOUNTER — Other Ambulatory Visit: Payer: Self-pay

## 2020-08-28 ENCOUNTER — Encounter (HOSPITAL_COMMUNITY)
Admission: RE | Admit: 2020-08-28 | Discharge: 2020-08-28 | Disposition: A | Discharge status: Home / Self Care | Payer: Medicare Other | Source: Ambulatory Visit | Attending: Cardiology | Admitting: Cardiology

## 2020-08-28 VITALS — Ht 67.0 in | Wt 206.4 lb

## 2020-08-28 DIAGNOSIS — Z951 Presence of aortocoronary bypass graft: Secondary | ICD-10-CM

## 2020-08-28 DIAGNOSIS — I214 Non-ST elevation (NSTEMI) myocardial infarction: Secondary | ICD-10-CM

## 2020-08-29 ENCOUNTER — Encounter: Payer: Self-pay | Admitting: Cardiology

## 2020-08-29 ENCOUNTER — Ambulatory Visit: Payer: Federal, State, Local not specified - PPO | Admitting: Cardiology

## 2020-08-29 VITALS — BP 130/79 | HR 67 | Temp 98.2°F | Resp 17 | Ht 67.0 in | Wt 200.2 lb

## 2020-08-29 DIAGNOSIS — I119 Hypertensive heart disease without heart failure: Secondary | ICD-10-CM | POA: Diagnosis not present

## 2020-08-29 DIAGNOSIS — I25118 Atherosclerotic heart disease of native coronary artery with other forms of angina pectoris: Secondary | ICD-10-CM | POA: Diagnosis not present

## 2020-08-29 DIAGNOSIS — Z951 Presence of aortocoronary bypass graft: Secondary | ICD-10-CM

## 2020-08-29 DIAGNOSIS — N184 Chronic kidney disease, stage 4 (severe): Secondary | ICD-10-CM | POA: Diagnosis not present

## 2020-08-29 NOTE — Progress Notes (Signed)
Primary Physician/Referring:  Hoyt Koch, MD  Patient ID: Christian [name], male    DOB: 1945/08/17, 75 y.o.   MRN: [id number]  Chief Complaint  Patient presents with  . Hypertension  . Tachycardia  . chronic diastolic heart failure    6 weeks   HPI:    Christian [name]  is a 75 y.o. AA male with hypertension, hyperlipidemia, DM with stage 3 CKD,  CAD with  stenting to the PDA on 02/27/2014. Patient presented with unstable angina and NSTEMI on 08/09/2019 along with a very small subsegmental pulmonary embolism and discharged on 10/18/2019 after he underwent CABG x4 on 08/15/2019.    He had extremely complex postop complications including cardiogenic shock, cardiac arrest, needing internal CPR in the ICU after opening the chest, A. Fib needing cardioversion, closure office chest wall, developed acute renal failure leading to need for permanent dialysis, right brain infarct with left hemiparesis, multiple blood transfusions.  He was also placed on dialysis however is now off of dialysis since 02/01/2020.    He now presents for follow-up of CAD, hypertension, heart failure.  He is presently doing well, he is graduating from cardiac rehab tomorrow.  He has not had any further worsening leg edema, no PND or orthopnea.  He has not had any further significant chest discomfort and he has not used any sublingual nitroglycerin.  On his last office visit had increased his beta-blocker dose which is tolerating.  States that since then his blood pressure has also been well controlled.  Past Medical History:  Diagnosis Date  . BPH (benign prostatic hypertrophy)   . Coronary artery disease   . Diabetes mellitus without complication ([place])   . Dry eyes left  . Hiatal hernia   . 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  LIMA TO LAD, RIMA TO PDA, RADIAL ARTERY TO CIRC AND SEQUENTIALLY TO OM1. 08/15/2019  . Hyperlipidemia   . Hypertension   . Incomplete bladder emptying    . Nocturia   . 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 SOB (shortness of breath) on exertion    Past Surgical History:  Procedure Laterality Date  . APPLICATION OF WOUND VAC N/A 08/24/2019   Procedure: APPLICATION OF WOUND VAC;  Surgeon: [name], MD;  Location: [place];  Service: Thoracic;  Laterality: N/A;  . APPLICATION OF WOUND VAC  08/29/2019   Procedure: Wound Vac change;  Surgeon: [name], MD;  Location: [place] [facility];  Service: Open Heart Surgery;;  . APPLICATION OF WOUND VAC N/A 09/04/2019   Procedure: Application Of Wound Vac;  Surgeon: Grace Avaneesh, MD;  Location: [place];  Service: Open Heart Surgery;  Laterality: N/A;  . APPLICATION OF WOUND VAC N/A 09/06/2019   Procedure: APPLICATION OF ACELL, APPLICATION OF WOUND VAC USING PREVENA INCISIONAL  DRESSING;  Surgeon: Wallace Going, DO;  Location: [place];  Service: Plastics;  Laterality: N/A;  . CARDIAC CATHETERIZATION    . CORONARY ARTERY BYPASS GRAFT N/A 08/15/2019   Procedure: CORONARY ARTERY BYPASS GRAFTING (CABG), x 4 using bilateral IMAs and left radial artery .  LIMA TO LAD, RIMA TO PDA, RADIAL ARTERY TO CIRC AND SEQUENTIALLY TO OM1.;  Surgeon: [name], MD;  Location: [place];  Service: Open Heart Surgery;  Laterality: N/A;  . CORONARY STENT PLACEMENT  02/27/2014  distal rt/pd coronary       dr Einar Gip  . CYSTO/ BLADDER BIOPSY'S/ CAUTHERIZATION  01-14-2004  DR Gaynelle Arabian  . EXPLORATION POST OPERATIVE OPEN HEART N/A 08/16/2019   Procedure: Chest Closure S?P CABG WITH APPLICATION OF PREVENA  INCISIONAL WOUND VAC;  Surgeon: [name], MD;  Location: [facility];  Service: Open Heart Surgery;  Laterality: N/A;  . EXPLORATION POST OPERATIVE OPEN HEART N/A 08/21/2019   Procedure: CHEST WASHOUT S/P OPEN CHEST;  Surgeon: [name], MD;  Location: [place];  Service: Open Heart Surgery;  Laterality: N/A;   Open chest with Esmark dressing with Ioban sealant coverage.  . EXPLORATION POST OPERATIVE OPEN HEART N/A 08/18/2019   Procedure: EXPLORATION POST OPERATIVE OPEN HEART (performed 04/23 on unit);  Surgeon: [name], MD;  Location: [place];  Service: Open Heart Surgery;  Laterality: N/A;  . EXPLORATION POST OPERATIVE OPEN HEART N/A 08/24/2019   Procedure: CHEST WASHOUT POST OPERATIVE OPEN HEART;  Surgeon: [name], MD;  Location: [place];  Service: Open Heart Surgery;  Laterality: N/A;  . EXPLORATION POST OPERATIVE OPEN HEART N/A 08/29/2019   Procedure: CHEST WOUND WASHOUT POST OPERATIVE OPEN HEART;  Surgeon: [name], MD;  Location: [place];  Service: Open Heart Surgery;  Laterality: N/A;  . EXPLORATION POST OPERATIVE OPEN HEART N/A 09/04/2019   Procedure: MEDIASTINAL EXPLORATION WITH STERNAL WOUND IRRIGATION;  Surgeon: Grace Ledarrius, MD;  Location: [place];  Service: Open Heart Surgery;  Laterality: N/A;  . EXPLORATION POST OPERATIVE OPEN HEART N/A 09/14/2019   Procedure: EVACUATION OF HEMATOMA;  Surgeon: Grace Rafel, MD;  Location: [place];  Service: Open Heart Surgery;  Laterality: N/A;  . IR FLUORO GUIDE CV LINE LEFT  09/19/2019  . IR GASTROSTOMY TUBE MOD SED  10/04/2019  . IR GASTROSTOMY TUBE REMOVAL  11/29/2019  . IR PATIENT EVAL TECH 0-60 MINS  09/29/2019  . IR US GUIDE VASC ACCESS LEFT  09/19/2019  . LAPAROSCOPIC LYSIS OF ADHESIONS N/A 09/06/2019   Procedure: LAPAROSCOPIC OMENTAL HARVEST;  Surgeon: Kinsinger, Arta Bruce, MD;  Location: [place];  Service: General;  Laterality: N/A;  . LEFT HEART CATH AND CORONARY ANGIOGRAPHY N/A 08/10/2019   Procedure: LEFT HEART CATH AND CORONARY ANGIOGRAPHY;  Surgeon: Nigel Mormon, MD;  Location: [place] CV LAB;  Service: Cardiovascular;  Laterality: N/A;  . LEFT HEART CATHETERIZATION WITH CORONARY ANGIOGRAM N/A 02/27/2014   Procedure: LEFT HEART CATHETERIZATION WITH CORONARY ANGIOGRAM;  Surgeon: Laverda Page, MD;  Location: [facility]  CATH LAB;  Service: Cardiovascular;  Laterality: N/A;  . MEDIASTINAL EXPLORATION N/A 09/06/2019   Procedure: MEDIASTINAL EXPLORATION;  Surgeon: Grace Braydn, MD;  Location: [place];  Service: Thoracic;  Laterality: N/A;  . PECTORALIS FLAP  09/06/2019   Procedure: Pectoralis ADVANCEMENT Flap;  Surgeon: Wallace Going, DO;  Location: [place];  Service: Plastics;;  . PERCUTANEOUS CORONARY STENT INTERVENTION (PCI-S)  02/27/2014   Procedure: PERCUTANEOUS CORONARY STENT INTERVENTION (PCI-S);  Surgeon: Laverda Page, MD;  Location: [facility] CATH LAB;  Service: Cardiovascular;;  rt PDA  3.0/28mm Promus stent  . RADIAL ARTERY HARVEST Left 08/15/2019   Procedure: Radial Artery Harvest;  Surgeon: [name], MD;  Location: [place];  Service: Open Heart Surgery;  Laterality: Left;  . RIB PLATING N/A 09/06/2019   Procedure: STERNAL PLATING;  Surgeon: Grace Emauri, MD;  Location: [place];  Service: Thoracic;  Laterality: N/A;  . TEE WITHOUT CARDIOVERSION N/A 08/15/2019   Procedure:  TRANSESOPHAGEAL ECHOCARDIOGRAM (TEE);  Surgeon: [name], MD;  Location: [place];  Service: Open Heart Surgery;  Laterality: N/A;  . TRACHEOSTOMY TUBE PLACEMENT  08/29/2019   Procedure: Tracheostomy;  Surgeon: [name], MD;  Location: [place] [facility];  Service: Open Heart Surgery;;  . TRANSURETHRAL RESECTION OF PROSTATE  04/04/2012   Procedure: TRANSURETHRAL RESECTION OF THE PROSTATE WITH GYRUS INSTRUMENTS;  Surgeon: Ailene Rud, MD;  Location: [place];  Service: Urology;  Laterality: N/A;  . TRANSURETHRAL RESECTION OF PROSTATE N/A 09/27/2014   Procedure: TRANSURETHRAL RESECTION OF THE PROSTATE ;  Surgeon: Carolan Clines, MD;  Location: [facility];  Service: Urology;  Laterality: N/A;  . UPPER GASTROINTESTINAL ENDOSCOPY     Social History   Tobacco Use  . Smoking status: Former Smoker    Packs/day: 0.50    Years: 20.00    Pack years: 10.00    Types: Cigarettes    Quit date: 1975     Years since quitting: 47.3  . Smokeless tobacco: Never Used  Substance Use Topics  . Alcohol use: Not Currently    Alcohol/week: 0.0 standard drinks    Comment: 1 beer week rarely   ROS   Review of Systems  Cardiovascular: Negative for chest pain, dyspnea on exertion and leg swelling.  Musculoskeletal: Positive for joint pain.  Gastrointestinal: Negative for melena.  Neurological: Positive for disturbances in coordination (left sided weakness) and loss of balance.    Objective  Blood pressure 130/79, pulse 67, temperature 98.2 F (36.8 C), temperature source Temporal, resp. rate 17, height 5\' 7"  (1.702 m), weight 200 lb 3.2 oz (90.8 kg), SpO2 98 %.  Vitals with BMI 08/29/2020 08/29/2020 08/28/2020  Height - 5\' 7"  5\' 7"   Weight - 200 lbs 3 oz 206 lbs 6 oz  BMI - 14.97 02.63  Systolic 785 885 -  Diastolic 79 69 -  Pulse 67 93 -    Physical Exam Constitutional:      Comments: He is moderately built and mildly obese in no acute distress.  Neck:     Thyroid: No thyromegaly.  Cardiovascular:     Rate and Rhythm: Normal rate and regular rhythm.     Pulses:          Carotid pulses are 2+ on the right side and 2+ on the left side.      Femoral pulses are 2+ on the right side and 2+ on the left side.      Popliteal pulses are 0 on the right side and 0 on the left side.       Dorsalis pedis pulses are 0 on the right side and 0 on the left side.       Posterior tibial pulses are 0 on the right side and 0 on the left side.     Heart sounds: Normal heart sounds. No murmur heard. No gallop.      Comments: NO pedal edema, no JVD.  Pulmonary:     Effort: Pulmonary effort is normal.     Breath sounds: Normal breath sounds.  Chest:     Comments: Sternotomy scar has healed well Abdominal:     General: Bowel sounds are normal.     Palpations: Abdomen is soft.     Laboratory examination:   Recent Labs    10/16/19 0232 10/17/19 0254 10/18/19 0046 12/20/19 1312 03/25/20 1155  05/28/20 1538  NA 131* 131* 132* 136 144 139  K 3.9 3.9 3.9 3.7 4.3 4.0  CL 94* 95* 96* 97* 106 107  CO2 24 21* 23 33* 28 23  GLUCOSE 84 187* 175* 92 98 103*  BUN 65* 83* 37* 12 38* 68*  CREATININE 5.27* 6.48* 3.55* 2.87* 3.18* 3.33*  CALCIUM 9.2 9.0 9.1 9.0 9.3 9.8  GFRNONAA 10* 8* 16*  --   --   --   GFRAA 12* 9* 19*  --   --   --    CrCl cannot be calculated (Patient's most recent lab result is older than the maximum 21 days allowed.).  CMP Latest Ref Rng & Units 05/28/2020 03/25/2020 12/20/2019  Glucose 70 - 99 mg/dL 103(H) 98 92  BUN 6 - 23 mg/dL 68(H) 38(H) 12  Creatinine 0.40 - 1.50 mg/dL 3.33(H) 3.18(H) 2.87(H)  Sodium 135 - 145 mEq/L 139 144 136  Potassium 3.5 - 5.1 mEq/L 4.0 4.3 3.7  Chloride 96 - 112 mEq/L 107 106 97(L)  CO2 19 - 32 mEq/L 23 28 33(H)  Calcium 8.4 - 10.5 mg/dL 9.8 9.3 9.0  Total Protein 6.0 - 8.3 g/dL 8.0 - 5.7(L)  Total Bilirubin 0.2 - 1.2 mg/dL 0.3 - 0.4  Alkaline Phos 39 - 117 U/L 80 - -  AST 0 - 37 U/L 32 - 21  ALT 0 - 53 U/L 28 - 10   CBC Latest Ref Rng & Units 05/28/2020 10/17/2019 10/14/2019  WBC 4.0 - 10.5 K/uL 8.0 12.3(H) 14.2(H)  Hemoglobin 13.0 - 17.0 g/dL 11.9(L) 9.2(L) 9.2(L)  Hematocrit 39.0 - 52.0 % 35.7(L) 29.1(L) 30.2(L)  Platelets 150.0 - 400.0 K/uL 367.0 346 377   Lipid Panel No results for input(s): CHOL, TRIG, LDLCALC, VLDL, HDL, CHOLHDL, LDLDIRECT in the last 8760 hours.  HEMOGLOBIN A1C Lab Results  Component Value Date   HGBA1C 5.4 03/25/2020   MPG 94 12/20/2019   TSH Recent Labs    05/28/20 1538  TSH 1.24   Medications and allergies  No Known Allergies   Current Outpatient Medications on File Prior to Visit  Medication Sig Dispense Refill  . aspirin EC 81 MG tablet Take 81 mg by mouth daily.    Marland Kitchen atorvastatin (LIPITOR) 20 MG tablet Take 1 tablet (20 mg total) by mouth every evening. 90 tablet 3  . clonazePAM (KLONOPIN) 0.5 MG disintegrating tablet TAKE 1 TABLET BY MOUTH TWICE A DAY 60 tablet 5  . cromolyn (OPTICROM)  4 % ophthalmic solution SMARTSIG:1 Drop(s) In Eye(s) Every 6-12 Hours PRN    . [employer] ACETAMINOPHEN 325 MG tablet TAKE 2 CAPS BY MOUTH EVERY 6 HOURS AS NEEDED FOR PAIN FOR TEMP 100F OR ABOVE 60 tablet 0  . [employer] SENNA PLUS 8.6-50 MG tablet TAKE 2 TABLETS BY MOUTH AT BEDTIME FOR CONSTIPATION 60 tablet 1  . ferrous sulfate 325 (65 FE) MG tablet Take 325 mg by mouth daily with breakfast.    . furosemide (LASIX) 80 MG tablet Take 80 mg by mouth 2 (two) times daily. Dose changed by nephrologist 05/09/20    . hydrALAZINE (APRESOLINE) 25 MG tablet Take 1 tablet (25 mg total) by mouth 3 (three) times daily. 270 tablet 3  . isosorbide dinitrate (ISORDIL) 30 MG tablet TAKE 1 TABLET BY MOUTH 3 TIMES DAILY. 270 tablet 1  . metolazone (ZAROXOLYN) 5 MG tablet Take 1 tablet by mouth once a week.    . metoprolol succinate (TOPROL-XL) 200 MG 24 hr tablet Take 1 tablet (200 mg total) by mouth daily. Take with or immediately following a meal. 90 tablet 3  . multivitamin (RENA-VIT)  TABS tablet Take 1 tablet by mouth at bedtime.  0  . nitroGLYCERIN (NITROSTAT) 0.4 MG SL tablet Place 0.4 mg under the tongue as directed.    Marland Kitchen oxycodone (OXY-IR) 5 MG capsule Take 5 mg by mouth every 4 (four) hours as needed for pain.    Marland Kitchen PREVIDENT 5000 BOOSTER PLUS 1.1 % PSTE Take 1 application by mouth daily.     . sertraline (ZOLOFT) 25 MG tablet TAKE 1 TABLET ONE TIME A DAY 90 tablet 1  . silodosin (RAPAFLO) 8 MG CAPS capsule Take 8 mg by mouth at bedtime.    . Vitamin D, Ergocalciferol, (DRISDOL) 1.25 MG (50000 UNIT) CAPS capsule Take 1 capsule (50,000 Units total) by mouth every 7 (seven) days. 12 capsule 3   No current facility-administered medications on file prior to visit.    Radiology:  No results found.  Cardiac Studies:   Heart catheterization 08-20-19: LM: Normal LAD: Ostial 95% stenosis Ramus: Ostial/prox 30% stenosis LCx: Ostial 75% stenosis RCA: Prox-mid diffuse 40% calcific disease. Distal RCA 70% ISR of 3.0 by 28  mm Promus placed 02/27/2014.  Pre-CABG Dopplers including ABI and carotid duplex: No significant carotid artery stenosis.  Antegrade vertebral artery flow. ABI normal, with triphasic waveforms at the level of the ankle.  Hx of CABG 08/15/2019: LIMA TO LAD, RIMA TO PDA, SEQUENTIAL RADIAL ARTERY TO Cx-OM1.  Echocardiogram 07/22/2020: Left ventricle cavity is normal in size. Mild concentric hypertrophy of the left ventricle. Normal global wall motion. Normal LV systolic function with EF 67%. Normal diastolic filling pattern.  Trileaflet aortic valve with  Mild aortic valve leaflet calcification. Trace aortic valve stenosis. No regurgitation. Normal right atrial pressure.   EKG:     EKG 07/11/2020: Normal sinus rhythm at rate of 86 bpm, normal axis.  Incomplete right bundle branch block.  Poor R wave progression, anteroseptal infarct old.  Single PVC.  No significant change from 01/16/2020.   Assessment     ICD-10-CM   1. Atherosclerosis of native coronary artery of native heart with stable angina pectoris ([place])  I25.118   2. Hx of CABG 08/15/2019: LIMA TO LAD, RIMA TO PDA, SEQUENTIAL RADIAL ARTERY TO Cx-OM1.  Z95.1   3. CKD (chronic kidney disease) stage 4, GFR 15-29 ml/min (HCC)  N18.4   4. Hypertension with heart disease  I11.9     No orders of the defined types were placed in this encounter.  Medications Discontinued During This Encounter  Medication Reason  . clopidogrel (PLAVIX) 75 MG tablet Completed Course    No orders of the defined types were placed in this encounter.   Recommendations:   MAURICE FOTHERINGHAM  is a 75 y.o. AA male with hypertension, hyperlipidemia, DM with stage 3 CKD,  CAD with  stenting to the PDA on 02/27/2014. Patient presented with unstable angina and NSTEMI on 08/09/2019 along with a very small subsegmental pulmonary embolism and discharged on 10/18/2019 after he underwent CABG x4 on 08/15/2019.    On his last office visit 6 weeks ago, I had increased his  beta-blocker dose in view of persistent tachycardia and also elevated blood pressure which he is tolerating.  Heart rate is now well controlled and blood pressure is also very well controlled.  Otherwise he is doing well, without any clinical evidence of heart failure and he has not had any recurrence of chest pain.  I reinforced weight loss and regular exercise.  I will see him back in 6 months for follow-up of coronary artery  disease, hypertension and PAD.  Is being on dual antiplatelet therapy since his CABG, we could discontinue Plavix now that he is stable.  He is on guideline directed medical therapy otherwise.   Adrian Prows, MD, [facility] 08/29/2020, 3:15 PM Office: [phone]

## 2020-08-30 ENCOUNTER — Encounter (HOSPITAL_COMMUNITY)
Admission: RE | Admit: 2020-08-30 | Discharge: 2020-08-30 | Disposition: A | Discharge status: Home / Self Care | Payer: Medicare Other | Source: Ambulatory Visit | Attending: Cardiology | Admitting: Cardiology

## 2020-08-30 ENCOUNTER — Other Ambulatory Visit: Payer: Self-pay

## 2020-08-30 DIAGNOSIS — Z951 Presence of aortocoronary bypass graft: Secondary | ICD-10-CM | POA: Diagnosis not present

## 2020-08-30 DIAGNOSIS — I214 Non-ST elevation (NSTEMI) myocardial infarction: Secondary | ICD-10-CM | POA: Diagnosis not present

## 2020-08-30 NOTE — Progress Notes (Signed)
Discharge Progress Report  Patient Details  Name: Christian [name] MRN: [id number] Date of Birth: 07-04-1945 Referring Provider:   Flowsheet Row CARDIAC REHAB PHASE II ORIENTATION from 06/25/2020 in [place]  Referring Provider Adrian Prows, MD       Number of Visits: 27  Reason for Discharge:  Patient reached a stable level of exercise. Patient independent in their exercise. Patient has met program and personal goals.  Smoking History:  Social History   Tobacco Use  Smoking Status Former Smoker  . Packs/day: 0.50  . Years: 20.00  . Pack years: 10.00  . Types: Cigarettes  . Quit date: 50  . 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  ADL [facility]SD:   Initial Exercise Prescription:  Initial Exercise Prescription - 06/25/20 1300      Date of Initial Exercise RX and Referring Provider   Date 06/25/20    Referring Provider Adrian Prows, MD    Expected Discharge Date 08/23/20      NuStep   Level 1    SPM 75    Minutes 25    METs 1.8      Prescription Details   Frequency (times per week) 3    Duration Progress to 30 minutes of continuous aerobic without signs/symptoms of physical distress      Intensity   THRR 40-80% of Max Heartrate 59-118    Ratings of Perceived Exertion 11-13    Perceived Dyspnea 0-4      Progression   Progression Continue progressive overload as per policy without signs/symptoms or physical distress.      Resistance Training   Training Prescription Yes    Weight 2 lbs    Reps 10-15           Discharge Exercise Prescription (Final Exercise Prescription Changes):  Exercise Prescription Changes - 08/30/20 1600      Response to Exercise   Blood Pressure (Admit) 120/82    Blood Pressure (Exercise) 138/74    Blood Pressure (Exit) 142/88    Heart Rate (Admit) 88 bpm    Heart Rate (Exercise) 91 bpm    Heart Rate (Exit) 83 bpm    Rating  of Perceived Exertion (Exercise) 10    Symptoms None    Comments Pt graduated the CRP2 program today    Duration Progress to 30 minutes of  aerobic without signs/symptoms of physical distress    Intensity THRR unchanged      Progression   Progression Continue to progress workloads to maintain intensity without signs/symptoms of physical distress.    Average METs 1      Resistance Training   Training Prescription Yes    Weight 3 lbs    Reps 10-15    Time 10 Minutes      Interval Training   Interval Training No      NuStep   Level 4    SPM 65    Minutes 30    METs 1      Home Exercise Plan   Plans to continue exercise at Home (comment)    Frequency Add 4 additional days to program exercise sessions.    Initial Home Exercises Provided 07/26/20           Functional Capacity:  6 Minute Walk    Row Name 06/25/20 1337 08/21/20 1342       6 Minute Walk  Phase Initial Discharge    Distance 680 feet 942 feet    Distance Feet Change -- 262 ft    Walk Time 6 minutes 6 minutes    # of Rest Breaks 0 0    MPH 1.29 1.8    METS 1.92 2.3    RPE 9 12    Perceived Dyspnea  3 3    VO2 Peak 6.71 8.1    Symptoms Yes (comment) Yes (comment)    Comments SOB, RPD = 3 SOB, RPD = 3    Resting HR 99 bpm 80 bpm    Resting BP 144/84 142/80    Resting Oxygen Saturation  97 % 97 %    Exercise Oxygen Saturation  during 6 min walk 96 % 94 %    Max Ex. HR 115 bpm 116 bpm    Max Ex. BP 170/94 168/90    2 Minute Post BP 158/96 --           Psychological, QOL, Others - Outcomes: PHQ 2/9: Depression screen [facility] 2/9 09/12/2020 09/12/2020 06/25/2020 06/25/2020 05/28/2020  Decreased Interest 0 0 0 0 0  Down, Depressed, Hopeless 0 0 0 0 0  PHQ - 2 Score 0 0 0 0 0    Quality of Life:  Quality of Life - 08/22/20 0841      Quality of Life Scores   Health/Function Post 15.67 %    Socioeconomic Post 14.19 %    Psych/Spiritual Post 21 %    Family Post 11.4 %    GLOBAL Post 15.79 %            Personal Goals: Goals established at orientation with interventions provided to work toward goal.  Personal Goals and Risk Factors at Admission - 06/25/20 1336      Core Components/Risk Factors/Patient Goals on Admission    Weight Management Yes;Obesity;Weight Loss    Intervention Weight Management: Develop a combined nutrition and exercise program designed to reach desired caloric intake, while maintaining appropriate intake of nutrient and fiber, sodium and fats, and appropriate energy expenditure required for the weight goal.;Weight Management: Provide education and appropriate resources to help participant work on and attain dietary goals.;Weight Management/Obesity: Establish reasonable short term and long term weight goals.;Obesity: Provide education and appropriate resources to help participant work on and attain dietary goals.    Admit Weight 199 lb 11.8 oz (90.6 kg)    Expected Outcomes Short Term: Continue to assess and modify interventions until short term weight is achieved;Long Term: Adherence to nutrition and physical activity/exercise program aimed toward attainment of established weight goal;Weight Maintenance: Understanding of the daily nutrition guidelines, which includes 25-35% calories from fat, 7% or less cal from saturated fats, less than 244m cholesterol, less than 1.5gm of sodium, & 5 or more servings of fruits and vegetables daily;Weight Loss: Understanding of general recommendations for a balanced deficit meal plan, which promotes 1-2 lb weight loss per week and includes a negative energy balance of [phone] kcal/d;Understanding recommendations for meals to include 15-35% energy as protein, 25-35% energy from fat, 35-60% energy from carbohydrates, less than 2040mof dietary cholesterol, 20-35 gm of total fiber daily;Understanding of distribution of calorie intake throughout the day with the consumption of 4-5 meals/snacks    Improve shortness of breath with ADL's Yes     Intervention Provide education, individualized exercise plan and daily activity instruction to help decrease symptoms of SOB with activities of daily living.    Expected Outcomes Short Term: Improve cardiorespiratory fitness  to achieve a reduction of symptoms when performing ADLs;Long Term: Be able to perform more ADLs without symptoms or delay the onset of symptoms    Diabetes Yes    Intervention Provide education about signs/symptoms and action to take for hypo/hyperglycemia.;Provide education about proper nutrition, including hydration, and aerobic/resistive exercise prescription along with prescribed medications to achieve blood glucose in normal ranges: Fasting glucose 65-99 mg/dL    Expected Outcomes Short Term: Participant verbalizes understanding of the signs/symptoms and immediate care of hyper/hypoglycemia, proper foot care and importance of medication, aerobic/resistive exercise and nutrition plan for blood glucose control.;Long Term: Attainment of HbA1C < 7%.    Hypertension Yes    Intervention Provide education on lifestyle modifcations including regular physical activity/exercise, weight management, moderate sodium restriction and increased consumption of fresh fruit, vegetables, and low fat dairy, alcohol moderation, and smoking cessation.;Monitor prescription use compliance.    Expected Outcomes Short Term: Continued assessment and intervention until BP is < 140/2m HG in hypertensive participants. < 130/863mHG in hypertensive participants with diabetes, heart failure or chronic kidney disease.;Long Term: Maintenance of blood pressure at goal levels.    Lipids Yes    Intervention Provide education and support for participant on nutrition & aerobic/resistive exercise along with prescribed medications to achieve LDL <7067mHDL >71m99m  Expected Outcomes Short Term: Participant states understanding of desired cholesterol values and is compliant with medications prescribed. Participant is  following exercise prescription and nutrition guidelines.;Long Term: Cholesterol controlled with medications as prescribed, with individualized exercise RX and with personalized nutrition plan. Value goals: LDL < 70mg59mL > 40 mg.    Stress Yes    Intervention Offer individual and/or small group education and counseling on adjustment to heart disease, stress management and health-related lifestyle change. Teach and support self-help strategies.;Refer participants experiencing significant psychosocial distress to appropriate mental health specialists for further evaluation and treatment. When possible, include family members and significant others in education/counseling sessions.    Expected Outcomes Short Term: Participant demonstrates changes in health-related behavior, relaxation and other stress management skills, ability to obtain effective social support, and compliance with psychotropic medications if prescribed.;Long Term: Emotional wellbeing is indicated by absence of clinically significant psychosocial distress or social isolation.            Personal Goals Discharge:  Goals and Risk Factor Review    Row Name 07/23/20 1717 08/20/20 1525           Core Components/Risk Factors/Patient Goals Review   Personal Goals Review Weight Management/Obesity;Stress;Hypertension;Lipids;Diabetes;Improve shortness of breath with ADL's Weight Management/Obesity;Stress;Hypertension;Lipids;Diabetes;Improve shortness of breath with ADL's      Review issac is making slow progress with exercise. Ronav's continues to have intermittent BP elevations. Dr GanjiEinar Gipntly increased his beta blocker issac is making slow progress with exercise. Reakwon's blood pressures have been forwarded to Dr ganjiIrven Shellingce as Mr MaynaMonksinues to be moderately elevated at times. IsaacYaminalso gained weight. Zaroxlyn was added by his nephrologist. Will continue to monitor.      Expected Outcomes IsaacDomanik continue to  participate in phase 2 cardiac rehab for exercise, nutrtion and lifestyle modifications IsaacElyon continue to participate in phase 2 cardiac rehab for exercise, nutrtion and lifestyle modifications             Exercise Goals and Review:  Exercise Goals    Row Name 06/25/20 1344             Exercise Goals   Increase Physical Activity Yes  Intervention Provide advice, education, support and counseling about physical activity/exercise needs.;Develop an individualized exercise prescription for aerobic and resistive training based on initial evaluation findings, risk stratification, comorbidities and participant's personal goals.       Expected Outcomes Short Term: Attend rehab on a regular basis to increase amount of physical activity.;Long Term: Add in home exercise to make exercise part of routine and to increase amount of physical activity.;Long Term: Exercising regularly at least 3-5 days a week.       Increase Strength and Stamina Yes       Intervention Provide advice, education, support and counseling about physical activity/exercise needs.;Develop an individualized exercise prescription for aerobic and resistive training based on initial evaluation findings, risk stratification, comorbidities and participant's personal goals.       Expected Outcomes Short Term: Increase workloads from initial exercise prescription for resistance, speed, and METs.;Short Term: Perform resistance training exercises routinely during rehab and add in resistance training at home;Long Term: Improve cardiorespiratory fitness, muscular endurance and strength as measured by increased METs and functional capacity (6MWT)       Able to understand and use rate of perceived exertion (RPE) scale Yes       Intervention Provide education and explanation on how to use RPE scale       Expected Outcomes Short Term: Able to use RPE daily in rehab to express subjective intensity level;Long Term:  Able to use RPE to guide  intensity level when exercising independently       Able to understand and use Dyspnea scale Yes       Intervention Provide education and explanation on how to use Dyspnea scale       Expected Outcomes Short Term: Able to use Dyspnea scale daily in rehab to express subjective sense of shortness of breath during exertion;Long Term: Able to use Dyspnea scale to guide intensity level when exercising independently       Knowledge and understanding of Target Heart Rate Range (THRR) Yes       Intervention Provide education and explanation of THRR including how the numbers were predicted and where they are located for reference       Expected Outcomes Short Term: Able to state/look up THRR;Short Term: Able to use daily as guideline for intensity in rehab;Long Term: Able to use THRR to govern intensity when exercising independently       Understanding of Exercise Prescription Yes       Intervention Provide education, explanation, and written materials on patient's individual exercise prescription       Expected Outcomes Short Term: Able to explain program exercise prescription;Long Term: Able to explain home exercise prescription to exercise independently              Exercise Goals Re-Evaluation:  Exercise Goals Re-Evaluation    Row Name 07/01/20 1614 07/26/20 1345 07/30/20 0958 08/30/20 1612       Exercise Goal Re-Evaluation   Exercise Goals Review Increase Physical Activity;Increase Strength and Stamina;Able to understand and use rate of perceived exertion (RPE) scale;Knowledge and understanding of Target Heart Rate Range (THRR);Understanding of Exercise Prescription Increase Physical Activity;Increase Strength and Stamina;Able to understand and use rate of perceived exertion (RPE) scale;Knowledge and understanding of Target Heart Rate Range (THRR);Understanding of Exercise Prescription -- Increase Physical Activity;Increase Strength and Stamina;Able to understand and use rate of perceived exertion  (RPE) scale;Knowledge and understanding of Target Heart Rate Range (THRR);Understanding of Exercise Prescription    Comments Pt's first day of exercise in  the CRP2 program. Pt understands the exercise Rx, RPE scale, and THRR. Reviewed home exercise Rx. Pt is not doing any additional exercise at home. Encouraged to break times up into 5-10 minutes thoughout the day; 2-3x/week. Encouraged to use walker or cane for safety. -- Pt graduated the [place] program today. Pt tolerated exercise without sign or symptom. Pt achieved 1.0 MET today. Stongly encouraged pt to exercise at home by walking with his walker most days of the week for a total of 30 minutes each day, even if each session lasts only 10 minutes.    Expected Outcomes Will continue to monitor patient and progress exercise workloads as tolerated. Pt will walk at home 2-3 x/week for a total of 30 minutes. -- Pt will walk at home with his walker most days of the week for a total of 30 minutes, even if only 10 minute sessions.           Nutrition & Weight - Outcomes:  Pre Biometrics - 06/25/20 1045      Pre Biometrics   Waist Circumference 44 inches    Hip Circumference 46 inches    Waist to Hip Ratio 0.96 %    Triceps Skinfold 20 mm    % Body Fat 66 %    Grip Strength 27 kg    Flexibility 0 in   could not do   Single Leg Stand --   not done. H/O falls, assistive decvie.          Post Biometrics - 08/28/20 1447       Post  Biometrics   Height _0  (1.702 m)    Weight 93.6 kg    Waist Circumference 45 inches    Hip Circumference 46 inches    Waist to Hip Ratio 0.98 %    BMI (Calculated) 32.31    Triceps Skinfold 22 mm    % Body Fat 33.5 %    Grip Strength 29 kg    Flexibility --   Not performed   Single Leg Stand --   Not performed          Nutrition:  Nutrition Therapy & Goals - 07/17/20 1425      Nutrition Therapy   Diet TLC; low sodium    Drug/Food Interactions Statins/Certain Fruits      Personal Nutrition Goals    Nutrition Goal Reduce sodium intake to <2000 mg/day by reading labels and avoiding fast food    Personal Goal #2 Pt to reduce intake of soda, tea, and coffee      Intervention Plan   Intervention Prescribe, educate and counsel regarding individualized specific dietary modifications aiming towards targeted core components such as weight, hypertension, lipid management, diabetes, heart failure and other comorbidities.;Nutrition handout(s) given to patient.    Expected Outcomes Short Term Goal: Understand basic principles of dietary content, such as calories, fat, sodium, cholesterol and nutrients.;Long Term Goal: Adherence to prescribed nutrition plan.           Nutrition Discharge:   Education Questionnaire Score:  Knowledge Questionnaire Score - 08/21/20 1500      Knowledge Questionnaire Score   Post Score 15/24           Goals reviewed with patient; copy given to patient.Pt graduated from cardiac rehab program on 08/30/20 with completion of 27 exercise sessions in Phase II. Pt maintained good attendance and had some progress  his participation in cardiac rehab.    Medication list reconciled. Repeat  PHQ score- 0 .  Pt feels he has achieved his goals during cardiac rehab.  Draylon increased his distance on his post exercise walk test by 262 feet although his met level decreased. Orlandis was encouraged to walk at home with his walker as tolerated.Barnet Pall, RN,BSN 09/12/2020 10:16 AM

## 2020-09-06 ENCOUNTER — Other Ambulatory Visit: Payer: Self-pay | Admitting: *Deleted

## 2020-09-06 DIAGNOSIS — N186 End stage renal disease: Secondary | ICD-10-CM

## 2020-09-11 DIAGNOSIS — N186 End stage renal disease: Secondary | ICD-10-CM | POA: Diagnosis not present

## 2020-09-11 DIAGNOSIS — R262 Difficulty in walking, not elsewhere classified: Secondary | ICD-10-CM | POA: Diagnosis not present

## 2020-09-11 DIAGNOSIS — Z992 Dependence on renal dialysis: Secondary | ICD-10-CM | POA: Diagnosis not present

## 2020-09-11 DIAGNOSIS — I214 Non-ST elevation (NSTEMI) myocardial infarction: Secondary | ICD-10-CM | POA: Diagnosis not present

## 2020-09-13 ENCOUNTER — Encounter: Payer: Self-pay | Admitting: *Deleted

## 2020-09-13 ENCOUNTER — Other Ambulatory Visit: Payer: Self-pay

## 2020-09-13 ENCOUNTER — Ambulatory Visit (INDEPENDENT_AMBULATORY_CARE_PROVIDER_SITE_OTHER): Payer: Medicare Other | Admitting: Vascular Surgery

## 2020-09-13 ENCOUNTER — Ambulatory Visit (INDEPENDENT_AMBULATORY_CARE_PROVIDER_SITE_OTHER)
Admission: RE | Admit: 2020-09-13 | Discharge: 2020-09-13 | Disposition: A | Discharge status: Home / Self Care | Payer: Medicare Other | Source: Ambulatory Visit | Attending: Vascular Surgery | Admitting: Vascular Surgery

## 2020-09-13 ENCOUNTER — Encounter: Payer: Self-pay | Admitting: Vascular Surgery

## 2020-09-13 ENCOUNTER — Other Ambulatory Visit: Payer: Self-pay | Admitting: *Deleted

## 2020-09-13 ENCOUNTER — Ambulatory Visit (HOSPITAL_COMMUNITY)
Admission: RE | Admit: 2020-09-13 | Discharge: 2020-09-13 | Disposition: A | Discharge status: Home / Self Care | Payer: Medicare Other | Source: Ambulatory Visit | Attending: Vascular Surgery | Admitting: Vascular Surgery

## 2020-09-13 VITALS — BP 122/70 | HR 87 | Temp 98.1°F | Resp 20 | Ht 67.0 in | Wt 206.0 lb

## 2020-09-13 DIAGNOSIS — N186 End stage renal disease: Secondary | ICD-10-CM | POA: Diagnosis not present

## 2020-09-13 DIAGNOSIS — N184 Chronic kidney disease, stage 4 (severe): Secondary | ICD-10-CM | POA: Diagnosis not present

## 2020-09-13 NOTE — H&P (View-Only) (Signed)
Patient ID: Christian [name], male   DOB: 1946-03-06, 75 y.o.   MRN: [id number]  Reason for Consult: Follow-up   Referred by Hoyt Koch, *  Subjective:     HPI:  Christian [name] is a 75 y.o. male history of chronic kidney disease.  Recent underwent CABG with harvest of his left radial artery.  He is not currently on dialysis.  He does not have any previous history of pacemaker defibrillator port or tunneled catheter placement.  He denies any chest or breast surgery.  No arm surgery except for radial artery harvest.  He is right-hand dominant.  He does not take blood thinners.  Past Medical History:  Diagnosis Date  . BPH (benign prostatic hypertrophy)   . Chronic kidney disease   . Coronary artery disease   . Diabetes mellitus without complication ([place])   . Dry eyes left  . Hiatal hernia   . 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  LIMA TO LAD, RIMA TO PDA, RADIAL ARTERY TO CIRC AND SEQUENTIALLY TO OM1. 08/15/2019  . Hyperlipidemia   . Hypertension   . Incomplete bladder emptying   . Nocturia   . 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 SOB (shortness of breath) on exertion    Family History  Problem Relation Age of Onset  . Diabetes Mother   . Hypertension Mother   . Diabetes Father   . Diabetes Brother   . Hypertension Brother   . Bone cancer Brother   . Diabetes Brother    Past Surgical History:  Procedure Laterality Date  . APPLICATION OF WOUND VAC N/A 08/24/2019   Procedure: APPLICATION OF WOUND VAC;  Surgeon: [name], MD;  Location: [employer];  Service: Thoracic;  Laterality: N/A;  . APPLICATION OF WOUND VAC  08/29/2019   Procedure: Wound Vac change;  Surgeon: [name], MD;  Location: [place] [facility];  Service: Open Heart Surgery;;  . APPLICATION OF WOUND VAC N/A 09/04/2019   Procedure: Application Of Wound Vac;   Surgeon: Grace Blayton, MD;  Location: [place];  Service: Open Heart Surgery;  Laterality: N/A;  . APPLICATION OF WOUND VAC N/A 09/06/2019   Procedure: APPLICATION OF ACELL, APPLICATION OF WOUND VAC USING PREVENA INCISIONAL  DRESSING;  Surgeon: Wallace Going, DO;  Location: [place];  Service: Plastics;  Laterality: N/A;  . CARDIAC CATHETERIZATION    . CORONARY ARTERY BYPASS GRAFT N/A 08/15/2019   Procedure: CORONARY ARTERY BYPASS GRAFTING (CABG), x 4 using bilateral IMAs and left radial artery .  LIMA TO LAD, RIMA TO PDA, RADIAL ARTERY TO CIRC AND SEQUENTIALLY TO OM1.;  Surgeon: [name], MD;  Location: [place];  Service: Open Heart Surgery;  Laterality: N/A;  . CORONARY STENT PLACEMENT  02/27/2014   distal rt/pd coronary       dr Einar Gip  . CYSTO/ BLADDER BIOPSY'S/ CAUTHERIZATION  01-14-2004  DR Gaynelle Arabian  . EXPLORATION POST OPERATIVE OPEN HEART N/A 08/16/2019   Procedure: Chest Closure S?P CABG WITH APPLICATION OF PREVENA  INCISIONAL WOUND VAC;  Surgeon: [name], MD;  Location: [facility];  Service: Open Heart Surgery;  Laterality: N/A;  . EXPLORATION POST OPERATIVE OPEN HEART N/A 08/21/2019   Procedure: CHEST WASHOUT S/P OPEN CHEST;  Surgeon: [name], MD;  Location: [place];  Service: Open Heart  Surgery;  Laterality: N/A;  Open chest with Esmark dressing with Ioban sealant coverage.  . EXPLORATION POST OPERATIVE OPEN HEART N/A 08/18/2019   Procedure: EXPLORATION POST OPERATIVE OPEN HEART (performed 04/23 on unit);  Surgeon: [name], MD;  Location: [place];  Service: Open Heart Surgery;  Laterality: N/A;  . EXPLORATION POST OPERATIVE OPEN HEART N/A 08/24/2019   Procedure: CHEST WASHOUT POST OPERATIVE OPEN HEART;  Surgeon: [name], MD;  Location: [place];  Service: Open Heart Surgery;  Laterality: N/A;  . EXPLORATION POST OPERATIVE OPEN HEART N/A 08/29/2019   Procedure: CHEST WOUND WASHOUT POST OPERATIVE OPEN HEART;  Surgeon: [name], MD;  Location: [place];  Service: Open Heart Surgery;  Laterality: N/A;  . EXPLORATION POST OPERATIVE OPEN HEART N/A 09/04/2019   Procedure: MEDIASTINAL EXPLORATION WITH STERNAL WOUND IRRIGATION;  Surgeon: Grace Danile, MD;  Location: [place];  Service: Open Heart Surgery;  Laterality: N/A;  . EXPLORATION POST OPERATIVE OPEN HEART N/A 09/14/2019   Procedure: EVACUATION OF HEMATOMA;  Surgeon: Grace Barnaby, MD;  Location: [place];  Service: Open Heart Surgery;  Laterality: N/A;  . IR FLUORO GUIDE CV LINE LEFT  09/19/2019  . IR GASTROSTOMY TUBE MOD SED  10/04/2019  . IR GASTROSTOMY TUBE REMOVAL  11/29/2019  . IR PATIENT EVAL TECH 0-60 MINS  09/29/2019  . IR US GUIDE VASC ACCESS LEFT  09/19/2019  . LAPAROSCOPIC LYSIS OF ADHESIONS N/A 09/06/2019   Procedure: LAPAROSCOPIC OMENTAL HARVEST;  Surgeon: Kinsinger, Arta Bruce, MD;  Location: [place];  Service: General;  Laterality: N/A;  . LEFT HEART CATH AND CORONARY ANGIOGRAPHY N/A 08/10/2019   Procedure: LEFT HEART CATH AND CORONARY ANGIOGRAPHY;  Surgeon: Nigel Mormon, MD;  Location: [place] [facility];  Service: Cardiovascular;  Laterality: N/A;  . LEFT HEART CATHETERIZATION WITH CORONARY ANGIOGRAM N/A 02/27/2014   Procedure: LEFT HEART CATHETERIZATION WITH CORONARY ANGIOGRAM;  Surgeon: Laverda Page, MD;  Location: [facility] C[facility];  Service: Cardiovascular;  Laterality: N/A;  . MEDIASTINAL EXPLORATION N/A 09/06/2019   Procedure: MEDIASTINAL EXPLORATION;  Surgeon: Grace Kevyn, MD;  Location: [place];  Service: Thoracic;  Laterality: N/A;  . PECTORALIS FLAP  09/06/2019   Procedure: Pectoralis ADVANCEMENT Flap;  Surgeon: Wallace Going, DO;  Location: [place];  Service: Plastics;;  . PERCUTANEOUS CORONARY STENT INTERVENTION (PCI-S)  02/27/2014   Procedure: PERCUTANEOUS CORONARY STENT INTERVENTION (PCI-S);  Surgeon: Laverda Page, MD;  Location[facility];  Service: Cardiovascular;;  rt PDA  3.0/28mm Promus stent  . RADIAL ARTERY HARVEST Left 08/15/2019    Procedure: Radial Artery Harvest;  Surgeon: [name], MD;  Location: [place];  Service: Open Heart Surgery;  Laterality: Left;  . RIB PLATING N/A 09/06/2019   Procedure: STERNAL PLATING;  Surgeon: Grace Roylee, MD;  Location: [place];  Service: Thoracic;  Laterality: N/A;  . TEE WITHOUT CARDIOVERSION N/A 08/15/2019   Procedure: TRANSESOPHAGEAL ECHOCARDIOGRAM (TEE);  Surgeon: [name], MD;  Location: [place];  Service: Open Heart Surgery;  Laterality: N/A;  . TRACHEOSTOMY TUBE PLACEMENT  08/29/2019   Procedure: Tracheostomy;  Surgeon: [name], MD;  Location: [place] [facility];  Service: Open Heart Surgery;;  . TRANSURETHRAL RESECTION OF PROSTATE  04/04/2012   Procedure: TRANSURETHRAL RESECTION OF THE PROSTATE WITH GYRUS INSTRUMENTS;  Surgeon: Ailene Rud, MD;  Location: [place];  Service: Urology;  Laterality: N/A;  . TRANSURETHRAL RESECTION OF PROSTATE N/A 09/27/2014   Procedure:  TRANSURETHRAL RESECTION OF THE PROSTATE ;  Surgeon: Carolan Clines, MD;  Location: [facility];  Service: Urology;  Laterality: N/A;  . UPPER GASTROINTESTINAL ENDOSCOPY      Short Social History:  Social History   Tobacco Use  . Smoking status: Former Smoker    Packs/day: 0.50    Years: 20.00    Pack years: 10.00    Types: Cigarettes    Quit date: 1975    Years since quitting: 47.4  . Smokeless tobacco: Never Used  Substance Use Topics  . Alcohol use: Not Currently    Alcohol/week: 0.0 standard drinks    Comment: 1 beer week rarely    No Known Allergies  Current Outpatient Medications  Medication Sig Dispense Refill  . aspirin EC 81 MG tablet Take 81 mg by mouth daily.    Marland Kitchen atorvastatin (LIPITOR) 20 MG tablet Take 1 tablet (20 mg total) by mouth every evening. 90 tablet 3  . clonazePAM (KLONOPIN) 0.5 MG disintegrating tablet TAKE 1 TABLET BY MOUTH TWICE A DAY 60 tablet 5  . cromolyn (OPTICROM) 4 % ophthalmic solution SMARTSIG:1 Drop(s) In Eye(s) Every 6-12 Hours PRN     . [employer] ACETAMINOPHEN 325 MG tablet TAKE 2 CAPS BY MOUTH EVERY 6 HOURS AS NEEDED FOR PAIN FOR TEMP 100F OR ABOVE 60 tablet 0  . [employer] SENNA PLUS 8.6-50 MG tablet TAKE 2 TABLETS BY MOUTH AT BEDTIME FOR CONSTIPATION 60 tablet 1  . ferrous sulfate 325 (65 FE) MG tablet Take 325 mg by mouth daily with breakfast.    . furosemide (LASIX) 80 MG tablet Take 80 mg by mouth 2 (two) times daily. Dose changed by nephrologist 05/09/20    . hydrALAZINE (APRESOLINE) 25 MG tablet Take 1 tablet (25 mg total) by mouth 3 (three) times daily. 270 tablet 3  . isosorbide dinitrate (ISORDIL) 30 MG tablet TAKE 1 TABLET BY MOUTH 3 TIMES DAILY. 270 tablet 1  . metolazone (ZAROXOLYN) 5 MG tablet Take 1 tablet by mouth once a week.    . multivitamin (RENA-VIT) TABS tablet Take 1 tablet by mouth at bedtime.  0  . nitroGLYCERIN (NITROSTAT) 0.4 MG SL tablet Place 0.4 mg under the tongue as directed.    Marland Kitchen oxycodone (OXY-IR) 5 MG capsule Take 5 mg by mouth every 4 (four) hours as needed for pain.    Marland Kitchen PREVIDENT 5000 BOOSTER PLUS 1.1 % PSTE Take 1 application by mouth daily.     . sertraline (ZOLOFT) 25 MG tablet TAKE 1 TABLET ONE TIME A DAY 90 tablet 1  . silodosin (RAPAFLO) 8 MG CAPS capsule Take 8 mg by mouth at bedtime.    . Vitamin D, Ergocalciferol, (DRISDOL) 1.25 MG (50000 UNIT) CAPS capsule Take 1 capsule (50,000 Units total) by mouth every 7 (seven) days. 12 capsule 3  . clopidogrel (PLAVIX) 75 MG tablet Take 1 tablet by mouth daily.    . metoprolol succinate (TOPROL-XL) 200 MG 24 hr tablet Take 1 tablet (200 mg total) by mouth daily. Take with or immediately following a meal. (Patient not taking: Reported on 09/13/2020) 90 tablet 3   No current facility-administered medications for this visit.    Review of Systems  Constitutional:  Constitutional negative. HENT: HENT negative.  Eyes: Eyes negative.  Respiratory: Respiratory negative.  Cardiovascular: Cardiovascular negative.  GI: Gastrointestinal negative.   Musculoskeletal: Musculoskeletal negative.  Skin: Skin negative.  Neurological: Neurological negative. Hematologic: Hematologic/lymphatic negative.  Psychiatric: Psychiatric negative.        Objective:  Objective  Vitals:   09/13/20 0947  BP: 122/70  Pulse: 87  Resp: 20  Temp: 98.1 F (36.7 C)  SpO2: 95%  Weight: 206 lb (93.4 kg)  Height: 5\' 7"  (1.702 m)   Body mass index is 32.26 kg/m.  Physical Exam HENT:     Head: Normocephalic.     Nose:     Comments: Wearing a mask Eyes:     Pupils: Pupils are equal, round, and reactive to light.  Cardiovascular:     Rate and Rhythm: Normal rate.     Comments: Palpable right radial and ulnar and left ulnar pulses Pulmonary:     Effort: Pulmonary effort is normal.  Abdominal:     General: Abdomen is flat.     Palpations: Abdomen is soft.  Musculoskeletal:        General: Normal range of motion.  Skin:    General: Skin is warm and dry.     Capillary Refill: Capillary refill takes less than 2 seconds.  Neurological:     General: No focal deficit present.     Mental Status: He is alert.  Psychiatric:        Mood and Affect: Mood normal.        Behavior: Behavior normal.        Thought Content: Thought content normal.        Judgment: Judgment normal.     Data: +-----------------+-------------+----------+---------+  Right Cephalic  Diameter (cm)Depth (cm)Findings   +-----------------+-------------+----------+---------+  Shoulder       0.16               +-----------------+-------------+----------+---------+  Prox upper arm              tortuous   +-----------------+-------------+----------+---------+  Mid upper arm    0.20               +-----------------+-------------+----------+---------+  Dist upper arm    0.19               +-----------------+-------------+----------+---------+  Antecubital fossa  0.27                +-----------------+-------------+----------+---------+  Prox forearm     0.34        branching  +-----------------+-------------+----------+---------+  Mid forearm     0.27               +-----------------+-------------+----------+---------+  Wrist        0.33               +-----------------+-------------+----------+---------+   +-----------------+-------------+----------+---------+  Right Basilic  Diameter (cm)Depth (cm)Findings   +-----------------+-------------+----------+---------+  Prox upper arm    0.61               +-----------------+-------------+----------+---------+  Mid upper arm    0.67               +-----------------+-------------+----------+---------+  Dist upper arm    0.54        branching  +-----------------+-------------+----------+---------+  Antecubital fossa  0.51               +-----------------+-------------+----------+---------+   +-----------------+-------------+----------+---------+  Left Cephalic  Diameter (cm)Depth (cm)Findings   +-----------------+-------------+----------+---------+  Shoulder       0.26               +-----------------+-------------+----------+---------+  Mid upper arm    0.26               +-----------------+-------------+----------+---------+  Dist upper arm    0.24               +-----------------+-------------+----------+---------+  Antecubital fossa  0.41               +-----------------+-------------+----------+---------+  Prox forearm     0.34        branching  +-----------------+-------------+----------+---------+  Mid forearm     0.33               +-----------------+-------------+----------+---------+  Wrist        0.36                +-----------------+-------------+----------+---------+   +-----------------+-------------+----------+---------+  Left Basilic   Diameter (cm)Depth (cm)Findings   +-----------------+-------------+----------+---------+  Prox upper arm    0.52               +-----------------+-------------+----------+---------+  Mid upper arm    0.57               +-----------------+-------------+----------+---------+  Dist upper arm    0.57               +-----------------+-------------+----------+---------+  Antecubital fossa  0.54        branching  +-----------------+-------------+----------+---------+   Right Pre-Dialysis Findings:  +-----------------------+----------+--------------------+---------+--------  +  Location        PSV (cm/s)Intralum. Diam. (cm)Waveform  Comments  +-----------------------+----------+--------------------+---------+--------  +  Brachial Antecub. fossa84    0.62        triphasic        +-----------------------+----------+--------------------+---------+--------  +  Radial Art at Wrist  70    0.27        triphasic        +-----------------------+----------+--------------------+---------+--------  +  Ulnar Art at Wrist   62    0.35        triphasic        +-----------------------+----------+--------------------+---------+--------  +        Left Pre-Dialysis Findings:  +------------------+----------+------------------+---------+---------------  ----+  Location     PSV (cm/s)Intralum. Diam.  Waveform Comments                      (cm)                        +------------------+----------+------------------+---------+---------------  ----+  Brachial Antecub. 76    0.46       triphasic            fossa                                       +------------------+----------+------------------+---------+---------------  ----+  Radial Art at                      harvested for  CABG   Wrist                          surgery         +------------------+----------+------------------+---------+---------------  ----+  Ulnar Art at Wrist79    0.46       triphasic            +------------------+----------+------------------+---------+---------------  ----+         Assessment/Plan:     75 year old male with chronic kidney disease sent for evaluation of dialysis access.  He has suitable basilic veins bilaterally.  He is right-hand dominant.  Left radial artery has been harvested with strong ulnar pulse on the left.  We have been asked to place AV fistula or weight  on graft.  Given that he is suitable basilic vein we will plan for left brachial artery to basilic vein fistula creation.  We will evaluate the cephalic vein at the time of surgery.  I discussed the risk benefits alternatives as well as the need for 2 operations.  Patient demonstrates good understanding.  We will schedule him in the near future.     Waynetta Sandy MD [facility]

## 2020-09-13 NOTE — Progress Notes (Signed)
Patient ID: Christian [name], male   DOB: Dec 24, 1945, 75 y.o.   MRN: [id number]  Reason for Consult: Follow-up   Referred by Hoyt Koch, *  Subjective:     HPI:  Christian [name] is a 75 y.o. male history of chronic kidney disease.  Recent underwent CABG with harvest of his left radial artery.  He is not currently on dialysis.  He does not have any previous history of pacemaker defibrillator port or tunneled catheter placement.  He denies any chest or breast surgery.  No arm surgery except for radial artery harvest.  He is right-hand dominant.  He does not take blood thinners.  Past Medical History:  Diagnosis Date  . BPH (benign prostatic hypertrophy)   . Chronic kidney disease   . Coronary artery disease   . Diabetes mellitus without complication ([place])   . Dry eyes left  . Hiatal hernia   . 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  LIMA TO LAD, RIMA TO PDA, RADIAL ARTERY TO CIRC AND SEQUENTIALLY TO OM1. 08/15/2019  . Hyperlipidemia   . Hypertension   . Incomplete bladder emptying   . Nocturia   . 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 SOB (shortness of breath) on exertion    Family History  Problem Relation Age of Onset  . Diabetes Mother   . Hypertension Mother   . Diabetes Father   . Diabetes Brother   . Hypertension Brother   . Bone cancer Brother   . Diabetes Brother    Past Surgical History:  Procedure Laterality Date  . APPLICATION OF WOUND VAC N/A 08/24/2019   Procedure: APPLICATION OF WOUND VAC;  Surgeon: [name], MD;  Location: [place];  Service: Thoracic;  Laterality: N/A;  . APPLICATION OF WOUND VAC  08/29/2019   Procedure: Wound Vac change;  Surgeon: [name], MD;  Location: [facility];  Service: Open Heart Surgery;;  . APPLICATION OF WOUND VAC N/A 09/04/2019   Procedure: Application Of Wound Vac;   Surgeon: Grace Jaceion, MD;  Location: [place];  Service: Open Heart Surgery;  Laterality: N/A;  . APPLICATION OF WOUND VAC N/A 09/06/2019   Procedure: APPLICATION OF ACELL, APPLICATION OF WOUND VAC USING PREVENA INCISIONAL  DRESSING;  Surgeon: Wallace Going, DO;  Location: [place];  Service: Plastics;  Laterality: N/A;  . CARDIAC CATHETERIZATION    . CORONARY ARTERY BYPASS GRAFT N/A 08/15/2019   Procedure: CORONARY ARTERY BYPASS GRAFTING (CABG), x 4 using bilateral IMAs and left radial artery .  LIMA TO LAD, RIMA TO PDA, RADIAL ARTERY TO CIRC AND SEQUENTIALLY TO OM1.;  Surgeon: [name], MD;  Location: [place];  Service: Open Heart Surgery;  Laterality: N/A;  . CORONARY STENT PLACEMENT  02/27/2014   distal rt/pd coronary       dr Einar Gip  . CYSTO/ BLADDER BIOPSY'S/ CAUTHERIZATION  01-14-2004  DR Gaynelle Arabian  . EXPLORATION POST OPERATIVE OPEN HEART N/A 08/16/2019   Procedure: Chest Closure S?P CABG WITH APPLICATION OF PREVENA  INCISIONAL WOUND VAC;  Surgeon: [name], MD;  [facility];  Service: Open Heart Surgery;  Laterality: N/A;  . EXPLORATION POST OPERATIVE OPEN HEART N/A 08/21/2019   Procedure: CHEST WASHOUT S/P OPEN CHEST;  Surgeon: [name], MD;  Location: [place];  Service: Open Heart  Surgery;  Laterality: N/A;  Open chest with Esmark dressing with Ioban sealant coverage.  . EXPLORATION POST OPERATIVE OPEN HEART N/A 08/18/2019   Procedure: EXPLORATION POST OPERATIVE OPEN HEART (performed 04/23 on unit);  Surgeon: [name], MD;  Location: [place];  Service: Open Heart Surgery;  Laterality: N/A;  . EXPLORATION POST OPERATIVE OPEN HEART N/A 08/24/2019   Procedure: CHEST WASHOUT POST OPERATIVE OPEN HEART;  Surgeon: [name], MD;  Location: [place];  Service: Open Heart Surgery;  Laterality: N/A;  . EXPLORATION POST OPERATIVE OPEN HEART N/A 08/29/2019   Procedure: CHEST WOUND WASHOUT POST OPERATIVE OPEN HEART;  Surgeon: [name], MD;  Location: [place];  Service: Open Heart Surgery;  Laterality: N/A;  . EXPLORATION POST OPERATIVE OPEN HEART N/A 09/04/2019   Procedure: MEDIASTINAL EXPLORATION WITH STERNAL WOUND IRRIGATION;  Surgeon: Grace London, MD;  Location: [place];  Service: Open Heart Surgery;  Laterality: N/A;  . EXPLORATION POST OPERATIVE OPEN HEART N/A 09/14/2019   Procedure: EVACUATION OF HEMATOMA;  Surgeon: Grace Spero, MD;  Location: [place];  Service: Open Heart Surgery;  Laterality: N/A;  . IR FLUORO GUIDE CV LINE LEFT  09/19/2019  . IR GASTROSTOMY TUBE MOD SED  10/04/2019  . IR GASTROSTOMY TUBE REMOVAL  11/29/2019  . IR PATIENT EVAL TECH 0-60 MINS  09/29/2019  . IR US GUIDE VASC ACCESS LEFT  09/19/2019  . LAPAROSCOPIC LYSIS OF ADHESIONS N/A 09/06/2019   Procedure: LAPAROSCOPIC OMENTAL HARVEST;  Surgeon: Kinsinger, Arta Bruce, MD;  Location: [place];  Service: General;  Laterality: N/A;  . LEFT HEART CATH AND CORONARY ANGIOGRAPHY N/A 08/10/2019   Procedure: LEFT HEART CATH AND CORONARY ANGIOGRAPHY;  Surgeon: Nigel Mormon, MD;  Location: [place] [facility];  Service: Cardiovascular;  Laterality: N/A;  . LEFT HEART CATHETERIZATION WITH CORONARY ANGIOGRAM N/A 02/27/2014   Procedure: LEFT HEART CATHETERIZATION WITH CORONARY ANGIOGRAM;  Surgeon: Laverda Page, MD;  Location: [facility] CATH [facility];  Service: Cardiovascular;  Laterality: N/A;  . MEDIASTINAL EXPLORATION N/A 09/06/2019   Procedure: MEDIASTINAL EXPLORATION;  Surgeon: Grace Kimothy, MD;  Location: [place];  Service: Thoracic;  Laterality: N/A;  . PECTORALIS FLAP  09/06/2019   Procedure: Pectoralis ADVANCEMENT Flap;  Surgeon: Wallace Going, DO;  Location: [place];  Service: Plastics;;  . PERCUTANEOUS CORONARY STENT INTERVENTION (PCI-S)  02/27/2014   Procedure: PERCUTANEOUS CORONARY STENT INTERVENTION (PCI-S);  Surgeon: Laverda Page, MD;  Location: [facility] CATH LAB;  Service: Cardiovascular;;  rt PDA  3.0/28mm Promus stent  . RADIAL ARTERY HARVEST Left 08/15/2019    Procedure: Radial Artery Harvest;  Surgeon: [name], MD;  Location: [place];  Service: Open Heart Surgery;  Laterality: Left;  . RIB PLATING N/A 09/06/2019   Procedure: STERNAL PLATING;  Surgeon: Grace Estel, MD;  Location: [place];  Service: Thoracic;  Laterality: N/A;  . TEE WITHOUT CARDIOVERSION N/A 08/15/2019   Procedure: TRANSESOPHAGEAL ECHOCARDIOGRAM (TEE);  Surgeon: [name], MD;  Location: [place];  Service: Open Heart Surgery;  Laterality: N/A;  . TRACHEOSTOMY TUBE PLACEMENT  08/29/2019   Procedure: Tracheostomy;  Surgeon: [name], MD;  Location: [place] [facility];  Service: Open Heart Surgery;;  . TRANSURETHRAL RESECTION OF PROSTATE  04/04/2012   Procedure: TRANSURETHRAL RESECTION OF THE PROSTATE WITH GYRUS INSTRUMENTS;  Surgeon: Ailene Rud, MD;  Location: [place];  Service: Urology;  Laterality: N/A;  . TRANSURETHRAL RESECTION OF PROSTATE N/A 09/27/2014   Procedure:  TRANSURETHRAL RESECTION OF THE PROSTATE ;  Surgeon: Carolan Clines, MD;  Location: [facility];  Service: Urology;  Laterality: N/A;  . UPPER GASTROINTESTINAL ENDOSCOPY      Short Social History:  Social History   Tobacco Use  . Smoking status: Former Smoker    Packs/day: 0.50    Years: 20.00    Pack years: 10.00    Types: Cigarettes    Quit date: 1975    Years since quitting: 47.4  . Smokeless tobacco: Never Used  Substance Use Topics  . Alcohol use: Not Currently    Alcohol/week: 0.0 standard drinks    Comment: 1 beer week rarely    No Known Allergies  Current Outpatient Medications  Medication Sig Dispense Refill  . aspirin EC 81 MG tablet Take 81 mg by mouth daily.    Marland Kitchen atorvastatin (LIPITOR) 20 MG tablet Take 1 tablet (20 mg total) by mouth every evening. 90 tablet 3  . clonazePAM (KLONOPIN) 0.5 MG disintegrating tablet TAKE 1 TABLET BY MOUTH TWICE A DAY 60 tablet 5  . cromolyn (OPTICROM) 4 % ophthalmic solution SMARTSIG:1 Drop(s) In Eye(s) Every 6-12 Hours PRN     . [employer] ACETAMINOPHEN 325 MG tablet TAKE 2 CAPS BY MOUTH EVERY 6 HOURS AS NEEDED FOR PAIN FOR TEMP 100F OR ABOVE 60 tablet 0  . [employer] SENNA PLUS 8.6-50 MG tablet TAKE 2 TABLETS BY MOUTH AT BEDTIME FOR CONSTIPATION 60 tablet 1  . ferrous sulfate 325 (65 FE) MG tablet Take 325 mg by mouth daily with breakfast.    . furosemide (LASIX) 80 MG tablet Take 80 mg by mouth 2 (two) times daily. Dose changed by nephrologist 05/09/20    . hydrALAZINE (APRESOLINE) 25 MG tablet Take 1 tablet (25 mg total) by mouth 3 (three) times daily. 270 tablet 3  . isosorbide dinitrate (ISORDIL) 30 MG tablet TAKE 1 TABLET BY MOUTH 3 TIMES DAILY. 270 tablet 1  . metolazone (ZAROXOLYN) 5 MG tablet Take 1 tablet by mouth once a week.    . multivitamin (RENA-VIT) TABS tablet Take 1 tablet by mouth at bedtime.  0  . nitroGLYCERIN (NITROSTAT) 0.4 MG SL tablet Place 0.4 mg under the tongue as directed.    Marland Kitchen oxycodone (OXY-IR) 5 MG capsule Take 5 mg by mouth every 4 (four) hours as needed for pain.    Marland Kitchen PREVIDENT 5000 BOOSTER PLUS 1.1 % PSTE Take 1 application by mouth daily.     . sertraline (ZOLOFT) 25 MG tablet TAKE 1 TABLET ONE TIME A DAY 90 tablet 1  . silodosin (RAPAFLO) 8 MG CAPS capsule Take 8 mg by mouth at bedtime.    . Vitamin D, Ergocalciferol, (DRISDOL) 1.25 MG (50000 UNIT) CAPS capsule Take 1 capsule (50,000 Units total) by mouth every 7 (seven) days. 12 capsule 3  . clopidogrel (PLAVIX) 75 MG tablet Take 1 tablet by mouth daily.    . metoprolol succinate (TOPROL-XL) 200 MG 24 hr tablet Take 1 tablet (200 mg total) by mouth daily. Take with or immediately following a meal. (Patient not taking: Reported on 09/13/2020) 90 tablet 3   No current facility-administered medications for this visit.    Review of Systems  Constitutional:  Constitutional negative. HENT: HENT negative.  Eyes: Eyes negative.  Respiratory: Respiratory negative.  Cardiovascular: Cardiovascular negative.  GI: Gastrointestinal negative.   Musculoskeletal: Musculoskeletal negative.  Skin: Skin negative.  Neurological: Neurological negative. Hematologic: Hematologic/lymphatic negative.  Psychiatric: Psychiatric negative.        Objective:  Objective  Vitals:   09/13/20 0947  BP: 122/70  Pulse: 87  Resp: 20  Temp: 98.1 F (36.7 C)  SpO2: 95%  Weight: 206 lb (93.4 kg)  Height: 5\' 7"  (1.702 m)   Body mass index is 32.26 kg/m.  Physical Exam HENT:     Head: Normocephalic.     Nose:     Comments: Wearing a mask Eyes:     Pupils: Pupils are equal, round, and reactive to light.  Cardiovascular:     Rate and Rhythm: Normal rate.     Comments: Palpable right radial and ulnar and left ulnar pulses Pulmonary:     Effort: Pulmonary effort is normal.  Abdominal:     General: Abdomen is flat.     Palpations: Abdomen is soft.  Musculoskeletal:        General: Normal range of motion.  Skin:    General: Skin is warm and dry.     Capillary Refill: Capillary refill takes less than 2 seconds.  Neurological:     General: No focal deficit present.     Mental Status: He is alert.  Psychiatric:        Mood and Affect: Mood normal.        Behavior: Behavior normal.        Thought Content: Thought content normal.        Judgment: Judgment normal.     Data: +-----------------+-------------+----------+---------+  Right Cephalic  Diameter (cm)Depth (cm)Findings   +-----------------+-------------+----------+---------+  Shoulder       0.16               +-----------------+-------------+----------+---------+  Prox upper arm              tortuous   +-----------------+-------------+----------+---------+  Mid upper arm    0.20               +-----------------+-------------+----------+---------+  Dist upper arm    0.19               +-----------------+-------------+----------+---------+  Antecubital fossa  0.27                +-----------------+-------------+----------+---------+  Prox forearm     0.34        branching  +-----------------+-------------+----------+---------+  Mid forearm     0.27               +-----------------+-------------+----------+---------+  Wrist        0.33               +-----------------+-------------+----------+---------+   +-----------------+-------------+----------+---------+  Right Basilic  Diameter (cm)Depth (cm)Findings   +-----------------+-------------+----------+---------+  Prox upper arm    0.61               +-----------------+-------------+----------+---------+  Mid upper arm    0.67               +-----------------+-------------+----------+---------+  Dist upper arm    0.54        branching  +-----------------+-------------+----------+---------+  Antecubital fossa  0.51               +-----------------+-------------+----------+---------+   +-----------------+-------------+----------+---------+  Left Cephalic  Diameter (cm)Depth (cm)Findings   +-----------------+-------------+----------+---------+  Shoulder       0.26               +-----------------+-------------+----------+---------+  Mid upper arm    0.26               +-----------------+-------------+----------+---------+  Dist upper arm    0.24               +-----------------+-------------+----------+---------+  Antecubital fossa  0.41               +-----------------+-------------+----------+---------+  Prox forearm     0.34        branching  +-----------------+-------------+----------+---------+  Mid forearm     0.33               +-----------------+-------------+----------+---------+  Wrist        0.36                +-----------------+-------------+----------+---------+   +-----------------+-------------+----------+---------+  Left Basilic   Diameter (cm)Depth (cm)Findings   +-----------------+-------------+----------+---------+  Prox upper arm    0.52               +-----------------+-------------+----------+---------+  Mid upper arm    0.57               +-----------------+-------------+----------+---------+  Dist upper arm    0.57               +-----------------+-------------+----------+---------+  Antecubital fossa  0.54        branching  +-----------------+-------------+----------+---------+   Right Pre-Dialysis Findings:  +-----------------------+----------+--------------------+---------+--------  +  Location        PSV (cm/s)Intralum. Diam. (cm)Waveform  Comments  +-----------------------+----------+--------------------+---------+--------  +  Brachial Antecub. fossa84    0.62        triphasic        +-----------------------+----------+--------------------+---------+--------  +  Radial Art at Wrist  70    0.27        triphasic        +-----------------------+----------+--------------------+---------+--------  +  Ulnar Art at Wrist   62    0.35        triphasic        +-----------------------+----------+--------------------+---------+--------  +        Left Pre-Dialysis Findings:  +------------------+----------+------------------+---------+---------------  ----+  Location     PSV (cm/s)Intralum. Diam.  Waveform Comments                      (cm)                        +------------------+----------+------------------+---------+---------------  ----+  Brachial Antecub. 76    0.46       triphasic            fossa                                       +------------------+----------+------------------+---------+---------------  ----+  Radial Art at                      harvested for  CABG   Wrist                          surgery         +------------------+----------+------------------+---------+---------------  ----+  Ulnar Art at Wrist79    0.46       triphasic            +------------------+----------+------------------+---------+---------------  ----+         Assessment/Plan:     75 year old male with chronic kidney disease sent for evaluation of dialysis access.  He has suitable basilic veins bilaterally.  He is right-hand dominant.  Left radial artery has been harvested with strong ulnar pulse on the left.  We have been asked to place AV fistula or weight  on graft.  Given that he is suitable basilic vein we will plan for left brachial artery to basilic vein fistula creation.  We will evaluate the cephalic vein at the time of surgery.  I discussed the risk benefits alternatives as well as the need for 2 operations.  Patient demonstrates good understanding.  We will schedule him in the near future.     Waynetta Sandy MD [facility]

## 2020-09-16 ENCOUNTER — Telehealth: Payer: Self-pay

## 2020-09-16 ENCOUNTER — Encounter (HOSPITAL_COMMUNITY): Payer: Self-pay | Admitting: Surgery

## 2020-09-16 NOTE — Progress Notes (Signed)
Anesthesia Chart Review: Christian [name]   Case: [id number] Date/Time: 09/18/20 1109   Procedure: ARTERIOVENOUS (AV) FISTULA CREATION LEFT VERSUS GRAFT (Left )   Anesthesia type: Choice   Pre-op diagnosis: esrd   Location: [facility] OR ROOM 12 / [facility]   Surgeons: Serafina Mitchell, MD      DISCUSSION: Patient is a 75 year old male scheduled for the above procedure.  History includes former smoker (quit 04/27/73), HTN, DM2, CAD (DES RCA 02/27/14; s/p NSTEMI with small RLL PE 08/09/19 with acute systolic CHF with EF 46-50% & 3VCAD 08/09/19, s/p CABG 08/15/19: LIMA-LAD, pedicled RIMA-PDA, left RA-OM1-distal OM branches of LCX, sternum left open due to swelling and RV compression; post-op aflutter, s/p beside cardioversion 08/17/19; cardiac arrest 08/18/19 requiring re-intubation and opening of chest in ICU with cardiac massage with dense clot evacuated from behind the RV; s/p mutliple sternal washouts followed by right pectoralis muscle advancement flap, layered closure of sternal wound, wound VAC 3/54/65 complicated by delayed hematoma in setting of heparin s/p mediastinal exploration and hematoma evacuation 09/14/19; also had AKI with underlying CKD requiring CRRT->HD, required open tracheostomy 08/29/19 decannulated 10/05/19; possible small right brain CVA with left sided weakness 09/07/19 unable to do MRI with no visible infarct on CT; discharged to [facility] 10/18/19), CKD (AKI post-CABG, requiring CRRT->HD, off 02/01/20), HLD, hiatal hernia, BPH (s/p TURP 04/04/12, 09/27/14), exertional dyspnea.   Last evaluation by cardiologist Dr. Einar Gip on 08/29/20. LVEF recovered on 06/2020 echo. HR and BP well controlled after increased b-blocker. No clinical evidence of heart failure and no recurrent CP. Six month follow-up planned. He had been on DAPT since CABG, but now he felt it was okay for patient to discontinue Plavix. (Of note, by current medication list, patient is still on Plavix and not on metoprolol. VVS asked him to hold Plavix for  procedure staring 09/13/20. If PAT RN is able to reach him by phone, she will clarify if he is taking or not taking Plavix and metoprolol. If he is no longer taking metoprolol and Dr. Einar Gip is not aware, then he will be instructed to contact Dr. Irven Shelling office to clarify. If she is unable to reach him by phone, then staff can clarify on the day of surgery.)  He is a same day work-up. Anesthesia team to evaluate on the day of surgery.   VS:  BP Readings from Last 3 Encounters:  09/13/20 122/70  08/29/20 130/79  07/11/20 (!) 156/87   Pulse Readings from Last 3 Encounters:  09/13/20 87  08/29/20 67  07/11/20 92    PROVIDERS: Hoyt Koch, MD is PCP  Adrian Prows, MD is cardiologist Fredrich Romans, MD is CT surgeon Harrie Jeans, MD is nephrologist   LABS: For day of surgery. As of 05/28/20, Cr 3.33, glucose 103, H/H 11.9/35.7, PLT 367. A1c 5.4% 03/25/20.   Spirometry 08/11/19:  Ref. Range 08/11/2019 13:59  FVC-Pre Latest Units: L 1.83  FVC-%Pred-Pre Latest Units: % 50  FEV1-Pre Latest Units: L 1.48  FEV1-%Pred-Pre Latest Units: % 54  Pre FEV1/FVC ratio Latest Units: % 81  FEV1FVC-%Pred-Pre Latest Units: % 106  FEF 25-75 Pre Latest Units: L/sec 1.53  FEF2575-%Pred-Pre Latest Units: % 66  FEV6-Pre Latest Units: L 1.81  FEV6-%Pred-Pre Latest Units: % 52  Pre FEV6/FVC Ratio Latest Units: % 99  FEV6FVC-%Pred-Pre Latest Units: % 103    EKG: EKG 07/11/2020: Normal sinus rhythm at rate of 86 bpm, normal axis.  Incomplete right bundle branch block.  Poor R wave progression, anteroseptal  infarct old.  Single PVC.  No significant change from 01/16/2020.    CV: Echocardiogram 07/22/2020: Left ventricle cavity is normal in size. Mild concentric hypertrophy of the left ventricle. Normal global wall motion. Normal LV systolic function with EF 67%. Normal diastolic filling pattern.  Trileaflet aortic valve with Mild aortic valve leaflet calcification. Trace aortic valve stenosis. No  regurgitation. Normal right atrial pressure. - Comparison 08/10/19 in setting of NSTEMI/PE LVEF 30-35%, diffuse LV hypokinesis, severe hypokinesis of entire anterior and septal wall, grade II DD, RVSP 14.1 mmHg, normal PASP; 08/19/19: LVEF 45-50%, LV mildly decreased function, grade II DD   Pre-CABG Dopplers including ABI and carotid duplex 08/11/2019: No significant carotid artery stenosis (1-39% bilateral ICA stenosis).  Antegrade vertebral artery flow. Normal flow hemodynamics seen in bilateral Ramos arteries. ABI normal, with triphasic waveforms at the level of the ankle.   Heart catheterization 08/10/2019: PRE-CABG LM: Normal LAD: Ostial 95% stenosis Ramus: Ostial/prox 30% stenosis LCx: Ostial 75% stenosis RCA: Prox-mid diffuse 40% calcific disease.Distal RCA 70% ISR of 3.0 by 28 mm Promus placed 02/27/2014. - S/p CABG 08/15/19: LIMA-LAD, pedicled RIMA-PDA, left RA-OM1-distal OM branches of LCx   Past Medical History:  Diagnosis Date  . BPH (benign prostatic hypertrophy)   . CHF (congestive heart failure) ([place])   . Chronic kidney disease   . Coronary artery disease   . Diabetes mellitus without complication ([place])   . Dry eyes left  . Hiatal hernia   . 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  LIMA TO LAD, RIMA TO PDA, RADIAL ARTERY TO CIRC AND SEQUENTIALLY TO OM1. 08/15/2019  . Hyperlipidemia   . Hypertension   . Incomplete bladder emptying   . Myocardial infarction ([place]) 08/09/2019  . Nocturia   . PE (pulmonary thromboembolism) ([place]) 08/09/2019   small RLL PE 08/09/19  . 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 SOB (shortness of breath) on exertion     Past Surgical History:  Procedure Laterality Date  . APPLICATION OF WOUND VAC N/A 08/24/2019   Procedure: APPLICATION OF WOUND VAC;  Surgeon: [name], MD;  Location: [place];  Service:  Thoracic;  Laterality: N/A;  . APPLICATION OF WOUND VAC  08/29/2019   Procedure: Wound Vac change;  Surgeon: [name], MD;  Location: [place] [facility];  Service: Open Heart Surgery;;  . APPLICATION OF WOUND VAC N/A 09/04/2019   Procedure: Application Of Wound Vac;  Surgeon: Grace Saifullah, MD;  Location: [place];  Service: Open Heart Surgery;  Laterality: N/A;  . APPLICATION OF WOUND VAC N/A 09/06/2019   Procedure: APPLICATION OF ACELL, APPLICATION OF WOUND VAC USING PREVENA INCISIONAL  DRESSING;  Surgeon: Wallace Going, DO;  Location: [place];  Service: Plastics;  Laterality: N/A;  . CARDIAC CATHETERIZATION    . CORONARY ARTERY BYPASS GRAFT N/A 08/15/2019   Procedure: CORONARY ARTERY BYPASS GRAFTING (CABG), x 4 using bilateral IMAs and left radial artery .  LIMA TO LAD, RIMA TO PDA, RADIAL ARTERY TO CIRC AND SEQUENTIALLY TO OM1.;  Surgeon: [name], MD;  Location: [place];  Service: Open Heart Surgery;  Laterality: N/A;  . CORONARY STENT PLACEMENT  02/27/2014   distal rt/pd coronary       dr Einar Gip  . CYSTO/ BLADDER BIOPSY'S/ CAUTHERIZATION  01-14-2004  DR Gaynelle Arabian  . EXPLORATION POST OPERATIVE OPEN  HEART N/A 08/16/2019   Procedure: Chest Closure S?P CABG WITH APPLICATION OF PREVENA  INCISIONAL WOUND VAC;  Surgeon: [name], MD;  Location: [place];  Service: Open Heart Surgery;  Laterality: N/A;  . EXPLORATION POST OPERATIVE OPEN HEART N/A 08/21/2019   Procedure: CHEST WASHOUT S/P OPEN CHEST;  Surgeon: [name], MD;  Location: [place];  Service: Open Heart Surgery;  Laterality: N/A;  Open chest with Esmark dressing with Ioban sealant coverage.  . EXPLORATION POST OPERATIVE OPEN HEART N/A 08/18/2019   Procedure: EXPLORATION POST OPERATIVE OPEN HEART (performed 04/23 on unit);  Surgeon: [name], MD;  Location: [place];  Service: Open Heart Surgery;  Laterality: N/A;  . EXPLORATION POST OPERATIVE OPEN HEART N/A 08/24/2019   Procedure: CHEST WASHOUT POST OPERATIVE OPEN  HEART;  Surgeon: [name], MD;  Location: [place];  Service: Open Heart Surgery;  Laterality: N/A;  . EXPLORATION POST OPERATIVE OPEN HEART N/A 08/29/2019   Procedure: CHEST WOUND WASHOUT POST OPERATIVE OPEN HEART;  Surgeon: [name], MD;  Location: [place];  Service: Open Heart Surgery;  Laterality: N/A;  . EXPLORATION POST OPERATIVE OPEN HEART N/A 09/04/2019   Procedure: MEDIASTINAL EXPLORATION WITH STERNAL WOUND IRRIGATION;  Surgeon: Grace Loukas, MD;  Location: [place];  Service: Open Heart Surgery;  Laterality: N/A;  . EXPLORATION POST OPERATIVE OPEN HEART N/A 09/14/2019   Procedure: EVACUATION OF HEMATOMA;  Surgeon: Grace Hudsen, MD;  Location: [place];  Service: Open Heart Surgery;  Laterality: N/A;  . IR FLUORO GUIDE CV LINE LEFT  09/19/2019  . IR GASTROSTOMY TUBE MOD SED  10/04/2019  . IR GASTROSTOMY TUBE REMOVAL  11/29/2019  . IR PATIENT EVAL TECH 0-60 MINS  09/29/2019  . IR US GUIDE VASC ACCESS LEFT  09/19/2019  . LAPAROSCOPIC LYSIS OF ADHESIONS N/A 09/06/2019   Procedure: LAPAROSCOPIC OMENTAL HARVEST;  Surgeon: Kinsinger, Arta Bruce, MD;  Location: [place];  Service: General;  Laterality: N/A;  . LEFT HEART CATH AND CORONARY ANGIOGRAPHY N/A 08/10/2019   Procedure: LEFT HEART CATH AND CORONARY ANGIOGRAPHY;  Surgeon: Nigel Mormon, MD;  Location: [place] CV LAB;  Service: Cardiovascular;  Laterality: N/A;  . LEFT HEART CATHETERIZATION WITH CORONARY ANGIOGRAM N/A 02/27/2014   Procedure: LEFT HEART CATHETERIZATION WITH CORONARY ANGIOGRAM;  Surgeon: Laverda Page, MD;  Location: [facility] CATH LAB;  Service: Cardiovascular;  Laterality: N/A;  . MEDIASTINAL EXPLORATION N/A 09/06/2019   Procedure: MEDIASTINAL EXPLORATION;  Surgeon: Grace Curtez, MD;  Location: [place];  Service: Thoracic;  Laterality: N/A;  . PECTORALIS FLAP  09/06/2019   Procedure: Pectoralis ADVANCEMENT Flap;  Surgeon: Wallace Going, DO;  Location: [place];  Service: Plastics;;  . PERCUTANEOUS  CORONARY STENT INTERVENTION (PCI-S)  02/27/2014   Procedure: PERCUTANEOUS CORONARY STENT INTERVENTION (PCI-S);  Surgeon: Laverda Page, MD;  Location: [facility] CATH LAB;  Service: Cardiovascular;;  rt PDA  3.0/28mm Promus stent  . RADIAL ARTERY HARVEST Left 08/15/2019   Procedure: Radial Artery Harvest;  Surgeon: [name], MD;  Location: [place];  Service: Open Heart Surgery;  Laterality: Left;  . RIB PLATING N/A 09/06/2019   Procedure: STERNAL PLATING;  Surgeon: Grace Ivey, MD;  Location: [place];  Service: Thoracic;  Laterality: N/A;  . TEE WITHOUT CARDIOVERSION N/A 08/15/2019   Procedure: TRANSESOPHAGEAL ECHOCARDIOGRAM (TEE);  Surgeon: [name], MD;  Location: [place];  Service: Open Heart Surgery;  Laterality: N/A;  . TRACHEOSTOMY TUBE PLACEMENT  08/29/2019  Procedure: Tracheostomy;  Surgeon: [name], MD;  Location: [place];  Service: Open Heart Surgery; Decannulated 6/10/20211  . TRANSURETHRAL RESECTION OF PROSTATE  04/04/2012   Procedure: TRANSURETHRAL RESECTION OF THE PROSTATE WITH GYRUS INSTRUMENTS;  Surgeon: Ailene Rud, MD;  Location: [facility];  Service: Urology;  Laterality: N/A;  . TRANSURETHRAL RESECTION OF PROSTATE N/A 09/27/2014   Procedure: TRANSURETHRAL RESECTION OF THE PROSTATE ;  Surgeon: Carolan Clines, MD;  Location: [facility];  Service: Urology;  Laterality: N/A;  . UPPER GASTROINTESTINAL ENDOSCOPY      MEDICATIONS: No current facility-administered medications for this encounter.   Marland Kitchen aspirin EC 81 MG tablet  . atorvastatin (LIPITOR) 20 MG tablet  . clonazePAM (KLONOPIN) 0.5 MG disintegrating tablet  . clopidogrel (PLAVIX) 75 MG tablet  . cromolyn (OPTICROM) 4 % ophthalmic solution  . CVS ACETAMINOPHEN 325 MG tablet  . CVS SENNA PLUS 8.6-50 MG tablet  . ferrous sulfate 325 (65 FE) MG tablet  . furosemide (LASIX) 80 MG tablet  . hydrALAZINE (APRESOLINE) 25 MG tablet  . isosorbide dinitrate (ISORDIL) 30 MG tablet  .  metolazone (ZAROXOLYN) 5 MG tablet  . metoprolol succinate (TOPROL-XL) 200 MG 24 hr tablet  . multivitamin (RENA-VIT) TABS tablet  . nitroGLYCERIN (NITROSTAT) 0.4 MG SL tablet  . oxycodone (OXY-IR) 5 MG capsule  . PREVIDENT 5000 BOOSTER PLUS 1.1 % PSTE  . sertraline (ZOLOFT) 25 MG tablet  . silodosin (RAPAFLO) 8 MG CAPS capsule  . Vitamin D, Ergocalciferol, (DRISDOL) 1.25 MG (50000 UNIT) CAPS capsule    Myra Gianotti, PA-C Surgical Short Stay/Anesthesiology [facility] Phone [phone] [facility] Phone [phone] 09/16/2020 2:30 PM

## 2020-09-16 NOTE — Telephone Encounter (Signed)
Returned pt's call to give pre op instructions. He is aware of arrival time and NPO after MDNT. Pt is also aware to only take his heart and b/p medications the morning of surgery with a small sip of water. No further questions/concerns at this time.

## 2020-09-16 NOTE — Anesthesia Preprocedure Evaluation (Addendum)
Anesthesia Evaluation  Patient identified by MRN, date of birth, ID band Patient awake    Reviewed: Allergy & Precautions, NPO status , Patient's Chart, lab work & pertinent test results  Airway Mallampati: II  TM Distance: >3 FB Neck ROM: Full    Dental  (+) Teeth Intact   Pulmonary former smoker, PE (04/21)   Pulmonary exam normal        Cardiovascular hypertension, Pt. on medications and Pt. on home beta blockers + CAD, + Past MI (04/21), + CABG and +CHF   Rhythm:Regular Rate:Normal     Neuro/Psych negative neurological ROS  negative psych ROS   GI/Hepatic Neg liver ROS, hiatal hernia,   Endo/Other  diabetes  Renal/GU CRFRenal disease  negative genitourinary   Musculoskeletal negative musculoskeletal ROS (+)   Abdominal (+)  Abdomen: soft. Bowel sounds: normal.  Peds  Hematology  (+) anemia ,   Anesthesia Other Findings   Reproductive/Obstetrics                           Anesthesia Physical Anesthesia Plan  ASA: III  Anesthesia Plan: MAC and Regional   Post-op Pain Management:    Induction: Intravenous  PONV Risk Score and Plan: 1 and Propofol infusion and Treatment may vary due to age or medical condition  Airway Management Planned: Simple Face Mask, Natural Airway and Nasal Cannula  Additional Equipment: None  Intra-op Plan:   Post-operative Plan:   Informed Consent: I have reviewed the patients History and Physical, chart, labs and discussed the procedure including the risks, benefits and alternatives for the proposed anesthesia with the patient or authorized representative who has indicated his/her understanding and acceptance.     Dental advisory given  Plan Discussed with: CRNA  Anesthesia Plan Comments: (See PAT note written 09/16/2020 by Myra Gianotti, PA-C.  Echocardiogram 07/22/2020: Left ventricle cavity is normal in size. Mild concentric hypertrophy of  the left ventricle. Normal global wall motion. Normal LV systolic function with EF 67%. Normal diastolic filling pattern.  Trileaflet aortic valve with Mild aortic valve leaflet calcification. Trace aortic valve stenosis. No regurgitation. Normal right atrial pressure.)       Anesthesia Quick Evaluation

## 2020-09-17 ENCOUNTER — Encounter (HOSPITAL_COMMUNITY): Payer: Self-pay | Admitting: Surgery

## 2020-09-17 ENCOUNTER — Other Ambulatory Visit: Payer: Self-pay

## 2020-09-17 NOTE — Progress Notes (Signed)
Spoke with pt and his wife, Pamala Hurry for pre-op call. Pt has hx of CAD with CABG, cardiologist is Dr. Einar Gip. Pt denies any recent chest pain or shortness of breath. Pt's last dose of Plavix was 09/12/20. Pt states he is not sure if he will continue Plavix after the surgery. Pt states he is pre-diabetic. He states he does not check his blood sugar at home. Last A1C was 5.4 on 03/25/20. Pt states he is not on dialysis at this time.   Pt's surgery is scheduled as ambulatory so no Covid test is required prior to surgery.

## 2020-09-18 ENCOUNTER — Encounter (HOSPITAL_COMMUNITY): Payer: Self-pay | Admitting: Surgery

## 2020-09-18 ENCOUNTER — Ambulatory Visit (HOSPITAL_COMMUNITY): Payer: Medicare Other | Admitting: Vascular Surgery

## 2020-09-18 ENCOUNTER — Encounter (HOSPITAL_COMMUNITY)
Admission: RE | Disposition: A | Discharge status: Home / Self Care | Payer: Self-pay | Source: Home / Self Care | Attending: Surgery

## 2020-09-18 ENCOUNTER — Ambulatory Visit (HOSPITAL_COMMUNITY)
Admission: RE | Admit: 2020-09-18 | Discharge: 2020-09-18 | Disposition: A | Discharge status: Home / Self Care | Payer: Medicare Other | Attending: Surgery | Admitting: Surgery

## 2020-09-18 DIAGNOSIS — E1122 Type 2 diabetes mellitus with diabetic chronic kidney disease: Secondary | ICD-10-CM | POA: Diagnosis not present

## 2020-09-18 DIAGNOSIS — Z7902 Long term (current) use of antithrombotics/antiplatelets: Secondary | ICD-10-CM | POA: Insufficient documentation

## 2020-09-18 DIAGNOSIS — Z7982 Long term (current) use of aspirin: Secondary | ICD-10-CM | POA: Diagnosis not present

## 2020-09-18 DIAGNOSIS — I120 Hypertensive chronic kidney disease with stage 5 chronic kidney disease or end stage renal disease: Secondary | ICD-10-CM | POA: Insufficient documentation

## 2020-09-18 DIAGNOSIS — N186 End stage renal disease: Secondary | ICD-10-CM | POA: Diagnosis not present

## 2020-09-18 DIAGNOSIS — Z87891 Personal history of nicotine dependence: Secondary | ICD-10-CM | POA: Diagnosis not present

## 2020-09-18 DIAGNOSIS — Z951 Presence of aortocoronary bypass graft: Secondary | ICD-10-CM | POA: Insufficient documentation

## 2020-09-18 DIAGNOSIS — Z955 Presence of coronary angioplasty implant and graft: Secondary | ICD-10-CM | POA: Diagnosis not present

## 2020-09-18 DIAGNOSIS — Z79899 Other long term (current) drug therapy: Secondary | ICD-10-CM | POA: Insufficient documentation

## 2020-09-18 DIAGNOSIS — I509 Heart failure, unspecified: Secondary | ICD-10-CM | POA: Diagnosis not present

## 2020-09-18 DIAGNOSIS — D631 Anemia in chronic kidney disease: Secondary | ICD-10-CM | POA: Diagnosis not present

## 2020-09-18 DIAGNOSIS — I132 Hypertensive heart and chronic kidney disease with heart failure and with stage 5 chronic kidney disease, or end stage renal disease: Secondary | ICD-10-CM | POA: Diagnosis not present

## 2020-09-18 HISTORY — DX: Pneumonia, unspecified organism: J18.9

## 2020-09-18 HISTORY — DX: Anemia, unspecified: D64.9

## 2020-09-18 HISTORY — DX: Prediabetes: R73.03

## 2020-09-18 HISTORY — PX: AV FISTULA PLACEMENT: SHX1204

## 2020-09-18 HISTORY — DX: Heart failure, unspecified: I50.9

## 2020-09-18 LAB — GLUCOSE, CAPILLARY: Glucose-Capillary: 100 mg/dL — ABNORMAL HIGH (ref 70–99)

## 2020-09-18 LAB — POCT I-STAT, CHEM 8
BUN: 57 mg/dL — ABNORMAL HIGH (ref 8–23)
Calcium, Ion: 1.17 mmol/L (ref 1.15–1.40)
Chloride: 107 mmol/L (ref 98–111)
Creatinine, Ser: 3.2 mg/dL — ABNORMAL HIGH (ref 0.61–1.24)
Glucose, Bld: 98 mg/dL (ref 70–99)
HCT: 35 % — ABNORMAL LOW (ref 39.0–52.0)
Hemoglobin: 11.9 g/dL — ABNORMAL LOW (ref 13.0–17.0)
Potassium: 3.3 mmol/L — ABNORMAL LOW (ref 3.5–5.1)
Sodium: 142 mmol/L (ref 135–145)
TCO2: 25 mmol/L (ref 22–32)

## 2020-09-18 SURGERY — ARTERIOVENOUS (AV) FISTULA CREATION
Anesthesia: Monitor Anesthesia Care | Site: Arm Upper | Laterality: Left

## 2020-09-18 MED ORDER — SODIUM CHLORIDE 0.9 % IV SOLN
INTRAVENOUS | Status: DC | PRN
  Administered 2020-09-18: 500 mL

## 2020-09-18 MED ORDER — PROPOFOL 500 MG/50ML IV EMUL
INTRAVENOUS | Status: DC | PRN
  Administered 2020-09-18: 50 ug/kg/min via INTRAVENOUS

## 2020-09-18 MED ORDER — OXYCODONE-ACETAMINOPHEN 5-325 MG PO TABS: 1.0000 | ORAL_TABLET | ORAL | 0 refills | Status: DC | PRN

## 2020-09-18 MED ORDER — SODIUM CHLORIDE 0.9 % IV SOLN: INTRAVENOUS | Status: DC

## 2020-09-18 MED ORDER — FENTANYL CITRATE (PF) 100 MCG/2ML IJ SOLN: 25.0000 ug | INTRAMUSCULAR | Status: DC | PRN

## 2020-09-18 MED ORDER — CEFAZOLIN SODIUM-DEXTROSE 2-4 GM/100ML-% IV SOLN
2.0000 g | INTRAVENOUS | Status: AC
  Administered 2020-09-18: 2 g via INTRAVENOUS

## 2020-09-18 MED ORDER — SODIUM CHLORIDE 0.9 % IV SOLN
INTRAVENOUS | Status: AC
  Filled 2020-09-18: qty 1.2

## 2020-09-18 MED ORDER — FENTANYL CITRATE (PF) 100 MCG/2ML IJ SOLN
50.0000 ug | Freq: Once | INTRAMUSCULAR | Status: AC
Start: 2020-09-18 — End: 2020-09-18

## 2020-09-18 MED ORDER — FENTANYL CITRATE (PF) 100 MCG/2ML IJ SOLN
INTRAMUSCULAR | Status: AC
  Administered 2020-09-18: 50 ug via INTRAVENOUS
  Filled 2020-09-18: qty 2

## 2020-09-18 MED ORDER — CHLORHEXIDINE GLUCONATE 0.12 % MT SOLN
OROMUCOSAL | Status: AC
  Administered 2020-09-18: 15 mL via OROMUCOSAL
  Filled 2020-09-18: qty 15

## 2020-09-18 MED ORDER — CHLORHEXIDINE GLUCONATE 0.12 % MT SOLN: 15.0000 mL | Freq: Once | OROMUCOSAL | Status: AC

## 2020-09-18 MED ORDER — METOPROLOL SUCCINATE ER 100 MG PO TB24
200.0000 mg | ORAL_TABLET | Freq: Every day | ORAL | Status: DC
  Administered 2020-09-18: 200 mg via ORAL
  Filled 2020-09-18: qty 2

## 2020-09-18 MED ORDER — METOPROLOL SUCCINATE ER 25 MG PO TB24
200.0000 mg | ORAL_TABLET | Freq: Once | ORAL | Status: DC
  Filled 2020-09-18: qty 8

## 2020-09-18 MED ORDER — ORAL CARE MOUTH RINSE: 15.0000 mL | Freq: Once | OROMUCOSAL | Status: AC

## 2020-09-18 MED ORDER — FENTANYL CITRATE (PF) 250 MCG/5ML IJ SOLN
INTRAMUSCULAR | Status: AC
  Filled 2020-09-18: qty 5

## 2020-09-18 MED ORDER — MIDAZOLAM HCL 2 MG/2ML IJ SOLN
INTRAMUSCULAR | Status: AC
  Administered 2020-09-18: 2 mg via INTRAVENOUS
  Filled 2020-09-18: qty 2

## 2020-09-18 MED ORDER — ONDANSETRON HCL 4 MG/2ML IJ SOLN: 4.0000 mg | Freq: Once | INTRAMUSCULAR | Status: DC | PRN

## 2020-09-18 MED ORDER — HEMOSTATIC AGENTS (NO CHARGE) OPTIME
TOPICAL | Status: DC | PRN
  Administered 2020-09-18: 1 via TOPICAL

## 2020-09-18 MED ORDER — LIDOCAINE-EPINEPHRINE (PF) 1.5 %-1:200000 IJ SOLN
INTRAMUSCULAR | Status: DC | PRN
  Administered 2020-09-18: 30 mL via PERINEURAL

## 2020-09-18 MED ORDER — CEFAZOLIN SODIUM-DEXTROSE 2-4 GM/100ML-% IV SOLN
INTRAVENOUS | Status: AC
  Filled 2020-09-18: qty 100

## 2020-09-18 MED ORDER — LIDOCAINE-EPINEPHRINE 1 %-1:100000 IJ SOLN
INTRAMUSCULAR | Status: AC
  Filled 2020-09-18: qty 1

## 2020-09-18 MED ORDER — ACETAMINOPHEN 10 MG/ML IV SOLN: 1000.0000 mg | Freq: Once | INTRAVENOUS | Status: DC | PRN

## 2020-09-18 MED ORDER — CHLORHEXIDINE GLUCONATE 4 % EX LIQD: 60.0000 mL | Freq: Once | CUTANEOUS | Status: DC

## 2020-09-18 MED ORDER — PROPOFOL 10 MG/ML IV BOLUS
INTRAVENOUS | Status: DC | PRN
  Administered 2020-09-18: 20 mg via INTRAVENOUS

## 2020-09-18 MED ORDER — PROPOFOL 10 MG/ML IV BOLUS
INTRAVENOUS | Status: AC
  Filled 2020-09-18: qty 40

## 2020-09-18 MED ORDER — 0.9 % SODIUM CHLORIDE (POUR BTL) OPTIME
TOPICAL | Status: DC | PRN
  Administered 2020-09-18: 1000 mL

## 2020-09-18 MED ORDER — MIDAZOLAM HCL 2 MG/2ML IJ SOLN: 2.0000 mg | Freq: Once | INTRAMUSCULAR | Status: AC

## 2020-09-18 MED ORDER — FENTANYL CITRATE (PF) 100 MCG/2ML IJ SOLN
INTRAMUSCULAR | Status: DC | PRN
  Administered 2020-09-18: 50 ug via INTRAVENOUS
  Administered 2020-09-18: 25 ug via INTRAVENOUS
  Administered 2020-09-18: 50 ug via INTRAVENOUS
  Administered 2020-09-18: 25 ug via INTRAVENOUS

## 2020-09-18 SURGICAL SUPPLY — 30 items
ARMBAND PINK RESTRICT EXTREMIT (MISCELLANEOUS) ×4 IMPLANT
CANISTER SUCT 3000ML PPV (MISCELLANEOUS) ×2 IMPLANT
CLIP VESOCCLUDE MED 6/CT (CLIP) ×2 IMPLANT
CLIP VESOCCLUDE SM WIDE 6/CT (CLIP) ×2 IMPLANT
COVER PROBE W GEL 5X96 (DRAPES) ×2 IMPLANT
COVER WAND RF STERILE (DRAPES) ×2 IMPLANT
DERMABOND ADVANCED (GAUZE/BANDAGES/DRESSINGS) ×1
DERMABOND ADVANCED .7 DNX12 (GAUZE/BANDAGES/DRESSINGS) ×1 IMPLANT
ELECT REM PT RETURN 9FT ADLT (ELECTROSURGICAL) ×2
ELECTRODE REM PT RTRN 9FT ADLT (ELECTROSURGICAL) ×1 IMPLANT
GLOVE BIOGEL PI IND STRL 7.5 (GLOVE) ×1 IMPLANT
GLOVE BIOGEL PI INDICATOR 7.5 (GLOVE) ×1
GLOVE SURG POLYISO LF SZ7.5 (GLOVE) ×2 IMPLANT
GOWN STRL REUS W/ TWL LRG LVL3 (GOWN DISPOSABLE) ×2 IMPLANT
GOWN STRL REUS W/ TWL XL LVL3 (GOWN DISPOSABLE) ×1 IMPLANT
GOWN STRL REUS W/TWL LRG LVL3 (GOWN DISPOSABLE) ×4
GOWN STRL REUS W/TWL XL LVL3 (GOWN DISPOSABLE) ×2
HEMOSTAT SNOW SURGICEL 2X4 (HEMOSTASIS) ×2 IMPLANT
KIT BASIN OR (CUSTOM PROCEDURE TRAY) ×2 IMPLANT
KIT TURNOVER KIT B (KITS) ×2 IMPLANT
NS IRRIG 1000ML POUR BTL (IV SOLUTION) ×2 IMPLANT
PACK CV ACCESS (CUSTOM PROCEDURE TRAY) ×2 IMPLANT
PAD ARMBOARD 7.5X6 YLW CONV (MISCELLANEOUS) ×4 IMPLANT
SUT PROLENE 6 0 CC (SUTURE) ×4 IMPLANT
SUT VIC AB 3-0 SH 27 (SUTURE) ×2
SUT VIC AB 3-0 SH 27X BRD (SUTURE) ×1 IMPLANT
SUT VICRYL 4-0 PS2 18IN ABS (SUTURE) ×2 IMPLANT
TOWEL GREEN STERILE (TOWEL DISPOSABLE) ×2 IMPLANT
UNDERPAD 30X36 HEAVY ABSORB (UNDERPADS AND DIAPERS) ×2 IMPLANT
WATER STERILE IRR 1000ML POUR (IV SOLUTION) ×2 IMPLANT

## 2020-09-18 NOTE — Discharge Instructions (Signed)
Office will call for appointment in 4-6 weeks  Contact MD for left hand pain

## 2020-09-18 NOTE — Op Note (Signed)
    Patient name: Christian [name] MRN: [id number] DOB: December 08, 1945 Sex: male  09/18/2020 Pre-operative Diagnosis: ESRD Post-operative diagnosis:  Same Surgeon:  Annamarie Major Assistants:  Arlee Muslim, PA Procedure:   Left 1st stage basilic vein fistula Anesthesia:  Regional Blood Loss:  minimal Specimens:  none  Findings:  Excellent basilic vein  Indications: This is a 75 year old gentleman who comes in today for dialysis access.  He has been previously had his left radial artery harvested for CABG.  Procedure:  The patient was identified in the holding area and taken to [place] [facility]  The patient was then placed supine on the table. regional anesthesia was administered.  The patient was prepped and draped in the usual sterile fashion.  A time out was called and antibiotics were administered.  A PA was necessary to expect the procedure versus complete details.  Ultrasound was used to evaluate the basilic and cephalic vein in the upper arm.  The left basilic vein appeared to be the best conduit for fistula creation.  Next an oblique incision was made just proximal antecubital crease.  I first dissected out the brachial artery.  This is a 4 mm mildly calcified artery which was mobilized and encircled with Vesseloops.  Next I dissected out the basilic vein.  This was a approximate 5 mm vein.  It is fully mobilized throughout the width of the incision.  Side branches were ligated between silk ties.  The vein was marked for orientation and ligated distally.  The vein distended nicely with heparin saline.  Next the brachial artery was occluded with vascular clamps and #11 blade was used to make an arteriotomy.  The vein was cut the appropriate length and beveled to fit the size of the arteriotomy.  A running anastomosis was created in end-to-side fashion with 6-0 Prolene.  Prior to completion the appropriate flushing maneuvers were performed and the anastomosis was completed.  Patient had a brisk ulnar  artery Doppler signal that did not change significantly with fistula compression.  I then inspected the course of the vein to make sure there were no kinks or other branches.  There was then irrigated.  Hemostasis was achieved.  Incision closed with 2 layers with 3-0 Vicryl followed by Dermabond.   Disposition: To PACU stable   V. Annamarie Major, M.D., [facility] [place] Office: [phone] Pager:  [phone]

## 2020-09-18 NOTE — Interval H&P Note (Signed)
History and Physical Interval Note:  09/18/2020 9:10 AM  Christian [name]  has presented today for surgery, with the diagnosis of esrd.  The various methods of treatment have been discussed with the patient and family. After consideration of risks, benefits and other options for treatment, the patient has consented to  Procedure(s): ARTERIOVENOUS (AV) FISTULA CREATION LEFT VERSUS GRAFT (Left) as a surgical intervention.  The patient's history has been reviewed, patient examined, no change in status, stable for surgery.  I have reviewed the patient's chart and labs.  Questions were answered to the patient's satisfaction.     Annamarie Major

## 2020-09-18 NOTE — Transfer of Care (Signed)
Immediate Anesthesia Transfer of Care Note  Patient: [name]  Procedure(s) Performed: ARTERIOVENOUS (AV) FISTULA CREATION LEFT VERSUS GRAFT (Left Arm Upper)  Patient Location: PACU  Anesthesia Type:MAC  Level of Consciousness: awake and alert   Airway & Oxygen Therapy: Patient Spontanous Breathing and Patient connected to nasal cannula oxygen  Post-op Assessment: Report given to RN and Post -op Vital signs reviewed and stable  Post vital signs: Reviewed and stable  Last Vitals:  Vitals Value Taken Time  BP    Temp    Pulse 79 09/18/20 1315  Resp 13 09/18/20 1315  SpO2 97 % 09/18/20 1315  Vitals shown include unvalidated device data.  Last Pain:  Vitals:   09/18/20 0942  TempSrc:   PainSc: 0-No pain      Patients Stated Pain Goal: 3 (13/08/65 7846)  Complications: No complications documented.

## 2020-09-18 NOTE — Progress Notes (Signed)
Orthopedic Tech Progress Note Patient Details:  Christian [name] 1945/12/12 [id number] PACU RN called requesting an ARM SLING  Ortho Devices Type of Ortho Device: Arm sling Ortho Device/Splint Location: LUE Ortho Device/Splint Interventions: Application,Adjustment   Post Interventions Patient Tolerated: Well Instructions Provided: [facility] [place] 09/18/2020, 2:23 PM

## 2020-09-18 NOTE — Anesthesia Procedure Notes (Signed)
Procedure Name: MAC Date/Time: 09/18/2020 11:41 AM Performed by: Lieutenant Diego, CRNA Pre-anesthesia Checklist: Patient identified, Emergency Drugs available, Suction available, Patient being monitored and Timeout performed Patient Re-evaluated:Patient Re-evaluated prior to induction Oxygen Delivery Method: Nasal cannula Preoxygenation: Pre-oxygenation with 100% oxygen Induction Type: IV induction

## 2020-09-18 NOTE — Anesthesia Procedure Notes (Signed)
Anesthesia Regional Block: Supraclavicular block   Pre-Anesthetic Checklist: ,, timeout performed, Correct Patient, Correct Site, Correct Laterality, Correct Procedure, Correct Position, site marked, Risks and benefits discussed,  Surgical consent,  Pre-op evaluation,  At surgeon's request and post-op pain management  Laterality: Left  Prep: Dura Prep       Needles:  Injection technique: Single-shot  Needle Type: Echogenic Stimulator Needle     Needle Length: 5cm  Needle Gauge: 20     Additional Needles:   Procedures:,,,, ultrasound used (permanent image in chart),,,,  Narrative:  Start time: 09/18/2020 10:52 AM End time: 09/18/2020 10:55 AM Injection made incrementally with aspirations every 5 mL.  Performed by: Personally  Anesthesiologist: Darral Dash, DO  Additional Notes: Patient identified. Risks/Benefits/Options discussed with patient including but not limited to bleeding, infection, nerve damage, failed block, incomplete pain control. Patient expressed understanding and wished to proceed. All questions were answered. Sterile technique was used throughout the entire procedure. Please see nursing notes for vital signs. Aspirated in 5cc intervals with injection for negative confirmation. Patient was given instructions on fall risk and not to get out of bed. All questions and concerns addressed with instructions to call with any issues or inadequate analgesia.

## 2020-09-19 ENCOUNTER — Encounter (HOSPITAL_COMMUNITY): Payer: Self-pay | Admitting: Surgery

## 2020-09-19 ENCOUNTER — Other Ambulatory Visit: Payer: Self-pay | Admitting: *Deleted

## 2020-09-19 DIAGNOSIS — N184 Chronic kidney disease, stage 4 (severe): Secondary | ICD-10-CM | POA: Diagnosis not present

## 2020-09-19 DIAGNOSIS — N186 End stage renal disease: Secondary | ICD-10-CM

## 2020-09-19 NOTE — Anesthesia Postprocedure Evaluation (Signed)
Anesthesia Post Note  Patient: Christian [name]  Procedure(s) Performed: ARTERIOVENOUS (AV) FISTULA CREATION LEFT VERSUS GRAFT (Left Arm Upper)     Patient location during evaluation: PACU Anesthesia Type: Regional and MAC Level of consciousness: awake and alert Pain management: pain level controlled Vital Signs Assessment: post-procedure vital signs reviewed and stable Respiratory status: spontaneous breathing, nonlabored ventilation, respiratory function stable and patient connected to nasal cannula oxygen Cardiovascular status: stable and blood pressure returned to baseline Postop Assessment: no apparent nausea or vomiting Anesthetic complications: no   No complications documented.  Last Vitals:  Vitals:   09/18/20 1330 09/18/20 1341  BP: (!) 159/83 133/79  Pulse: 82 80  Resp: 15 17  Temp:  36.4 C  SpO2: 98% 99%    Last Pain:  Vitals:   09/18/20 1341  TempSrc:   PainSc: 0-No pain                 Belenda Cruise P Blakleigh Straw

## 2020-09-23 ENCOUNTER — Other Ambulatory Visit: Payer: Self-pay | Admitting: Urology

## 2020-09-24 DIAGNOSIS — I2510 Atherosclerotic heart disease of native coronary artery without angina pectoris: Secondary | ICD-10-CM | POA: Diagnosis not present

## 2020-09-24 DIAGNOSIS — N2581 Secondary hyperparathyroidism of renal origin: Secondary | ICD-10-CM | POA: Diagnosis not present

## 2020-09-24 DIAGNOSIS — E1122 Type 2 diabetes mellitus with diabetic chronic kidney disease: Secondary | ICD-10-CM | POA: Diagnosis not present

## 2020-09-24 DIAGNOSIS — D631 Anemia in chronic kidney disease: Secondary | ICD-10-CM | POA: Diagnosis not present

## 2020-09-24 DIAGNOSIS — I129 Hypertensive chronic kidney disease with stage 1 through stage 4 chronic kidney disease, or unspecified chronic kidney disease: Secondary | ICD-10-CM | POA: Diagnosis not present

## 2020-09-24 DIAGNOSIS — N184 Chronic kidney disease, stage 4 (severe): Secondary | ICD-10-CM | POA: Diagnosis not present

## 2020-10-01 ENCOUNTER — Encounter: Payer: Self-pay | Admitting: Physician Assistant

## 2020-10-01 ENCOUNTER — Ambulatory Visit (INDEPENDENT_AMBULATORY_CARE_PROVIDER_SITE_OTHER): Payer: Medicare Other | Admitting: Physician Assistant

## 2020-10-01 ENCOUNTER — Other Ambulatory Visit: Payer: Self-pay

## 2020-10-01 VITALS — BP 146/84 | HR 83 | Temp 98.2°F | Resp 20 | Ht 67.0 in | Wt 216.5 lb

## 2020-10-01 DIAGNOSIS — N186 End stage renal disease: Secondary | ICD-10-CM

## 2020-10-01 DIAGNOSIS — Z992 Dependence on renal dialysis: Secondary | ICD-10-CM

## 2020-10-01 DIAGNOSIS — L7634 Postprocedural seroma of skin and subcutaneous tissue following other procedure: Secondary | ICD-10-CM

## 2020-10-01 NOTE — Progress Notes (Signed)
c   Postoperative Access Visit   History of Present Illness   Christian [name] is a 75 y.o. year old male who presents for postoperative follow-up for: Left 1st stage basilic vein fistula by Dr. Trula Slade on 09/18/20. The patient states that post op day 2 he was at home and went to sleep with arm in sling and woke up all tangled in the sling and started having increased swelling and drainage from his AC incision. This has continued now since POD#2. Drainage is clear- bloody tinged. He is having swelling but it has improved some with elevation. He denies any pain in his left arm or hand. The patient notes no steal symptoms. He is not having fever or chills. He is not currently on dialysis.   Physical Examination   Vitals:   10/01/20 1509  BP: (!) 146/84  Pulse: 83  Resp: 20  Temp: 98.2 F (36.8 C)  TempSrc: Temporal  SpO2: 97%  Weight: 216 lb 8 oz (98.2 kg)  Height: 5\' 7"  (1.702 m)   Body mass index is 33.91 kg/m.  left arm Incision is intact along distal aspect of incision where Dermabond is still present, but more proximal medial aspect the Dermabond has come off and there is serosanguinous drainage. The skin is intact.No surrounding erythema. There is localized area of fullness likely a seroma. Some swelling of left upper arm and forearm. 2+ radial pulse, hand grip is 5/5, sensation in digits is intact, palpable thrill, bruit can  be auscultated    Medical Decision Making   Christian [name] is a 75 y.o. year old male who presents s/p  Left 1st stage basilic vein fistula by Dr. Trula Slade on 09/18/20. He is without any steal symptoms. Post operative seroma present along AC incision with serosanguinous fluid drainage. No signs of infection. Discussed surgical evacuation of this but risk of recurrence is high. Have recommended conservative therapy with elevation, compression and time. Advised them to continue to clean daily with mild soap and water. Apply dry gauze and then ACE bandage.  Instructed them to observe incision for signs of infection. They will call for earlier follow up if they have any concerns otherwise he has a follow up scheduled for 6/27 for his routine post op visit with fistula duplex  Karoline Caldwell, PA-C [facility] Office: [facility] MD: Carlis Abbott / Stanford Breed

## 2020-10-03 ENCOUNTER — Inpatient Hospital Stay (HOSPITAL_COMMUNITY)
Admission: EM | Admit: 2020-10-03 | Discharge: 2020-10-07 | DRG: 291 | Disposition: A | Discharge status: Home / Self Care | Payer: Medicare Other | Attending: Family Medicine | Admitting: Family Medicine

## 2020-10-03 ENCOUNTER — Telehealth: Payer: Self-pay | Admitting: Internal Medicine

## 2020-10-03 ENCOUNTER — Emergency Department (HOSPITAL_COMMUNITY): Payer: Medicare Other

## 2020-10-03 ENCOUNTER — Emergency Department (HOSPITAL_COMMUNITY)
Admit: 2020-10-03 | Discharge: 2020-10-03 | Disposition: A | Discharge status: Home / Self Care | Payer: Medicare Other | Attending: Emergency Medicine | Admitting: Emergency Medicine

## 2020-10-03 ENCOUNTER — Other Ambulatory Visit: Payer: Self-pay

## 2020-10-03 ENCOUNTER — Encounter (HOSPITAL_COMMUNITY): Payer: Self-pay | Admitting: Emergency Medicine

## 2020-10-03 DIAGNOSIS — Z8673 Personal history of transient ischemic attack (TIA), and cerebral infarction without residual deficits: Secondary | ICD-10-CM

## 2020-10-03 DIAGNOSIS — J810 Acute pulmonary edema: Secondary | ICD-10-CM | POA: Diagnosis not present

## 2020-10-03 DIAGNOSIS — I252 Old myocardial infarction: Secondary | ICD-10-CM | POA: Diagnosis not present

## 2020-10-03 DIAGNOSIS — Z8701 Personal history of pneumonia (recurrent): Secondary | ICD-10-CM

## 2020-10-03 DIAGNOSIS — I509 Heart failure, unspecified: Secondary | ICD-10-CM | POA: Diagnosis not present

## 2020-10-03 DIAGNOSIS — D631 Anemia in chronic kidney disease: Secondary | ICD-10-CM | POA: Diagnosis present

## 2020-10-03 DIAGNOSIS — J811 Chronic pulmonary edema: Secondary | ICD-10-CM | POA: Diagnosis not present

## 2020-10-03 DIAGNOSIS — I480 Paroxysmal atrial fibrillation: Secondary | ICD-10-CM | POA: Diagnosis present

## 2020-10-03 DIAGNOSIS — Z7902 Long term (current) use of antithrombotics/antiplatelets: Secondary | ICD-10-CM | POA: Diagnosis not present

## 2020-10-03 DIAGNOSIS — F32A Depression, unspecified: Secondary | ICD-10-CM | POA: Diagnosis present

## 2020-10-03 DIAGNOSIS — E1142 Type 2 diabetes mellitus with diabetic polyneuropathy: Secondary | ICD-10-CM | POA: Diagnosis not present

## 2020-10-03 DIAGNOSIS — Z833 Family history of diabetes mellitus: Secondary | ICD-10-CM

## 2020-10-03 DIAGNOSIS — N400 Enlarged prostate without lower urinary tract symptoms: Secondary | ICD-10-CM | POA: Diagnosis present

## 2020-10-03 DIAGNOSIS — E872 Acidosis: Secondary | ICD-10-CM | POA: Diagnosis present

## 2020-10-03 DIAGNOSIS — D539 Nutritional anemia, unspecified: Secondary | ICD-10-CM | POA: Diagnosis not present

## 2020-10-03 DIAGNOSIS — E1165 Type 2 diabetes mellitus with hyperglycemia: Secondary | ICD-10-CM | POA: Diagnosis present

## 2020-10-03 DIAGNOSIS — I2510 Atherosclerotic heart disease of native coronary artery without angina pectoris: Secondary | ICD-10-CM | POA: Diagnosis not present

## 2020-10-03 DIAGNOSIS — J9601 Acute respiratory failure with hypoxia: Secondary | ICD-10-CM | POA: Diagnosis not present

## 2020-10-03 DIAGNOSIS — G35 Multiple sclerosis: Secondary | ICD-10-CM | POA: Diagnosis present

## 2020-10-03 DIAGNOSIS — R609 Edema, unspecified: Secondary | ICD-10-CM | POA: Diagnosis not present

## 2020-10-03 DIAGNOSIS — Z951 Presence of aortocoronary bypass graft: Secondary | ICD-10-CM

## 2020-10-03 DIAGNOSIS — K219 Gastro-esophageal reflux disease without esophagitis: Secondary | ICD-10-CM | POA: Diagnosis not present

## 2020-10-03 DIAGNOSIS — J189 Pneumonia, unspecified organism: Secondary | ICD-10-CM

## 2020-10-03 DIAGNOSIS — E1122 Type 2 diabetes mellitus with diabetic chronic kidney disease: Secondary | ICD-10-CM | POA: Diagnosis present

## 2020-10-03 DIAGNOSIS — I255 Ischemic cardiomyopathy: Secondary | ICD-10-CM | POA: Diagnosis not present

## 2020-10-03 DIAGNOSIS — R0602 Shortness of breath: Secondary | ICD-10-CM | POA: Diagnosis not present

## 2020-10-03 DIAGNOSIS — Z79899 Other long term (current) drug therapy: Secondary | ICD-10-CM

## 2020-10-03 DIAGNOSIS — J90 Pleural effusion, not elsewhere classified: Secondary | ICD-10-CM | POA: Diagnosis not present

## 2020-10-03 DIAGNOSIS — E1110 Type 2 diabetes mellitus with ketoacidosis without coma: Secondary | ICD-10-CM | POA: Diagnosis not present

## 2020-10-03 DIAGNOSIS — I248 Other forms of acute ischemic heart disease: Secondary | ICD-10-CM | POA: Diagnosis not present

## 2020-10-03 DIAGNOSIS — I119 Hypertensive heart disease without heart failure: Secondary | ICD-10-CM

## 2020-10-03 DIAGNOSIS — I700 Atherosclerosis of aorta: Secondary | ICD-10-CM | POA: Diagnosis not present

## 2020-10-03 DIAGNOSIS — N184 Chronic kidney disease, stage 4 (severe): Secondary | ICD-10-CM | POA: Diagnosis not present

## 2020-10-03 DIAGNOSIS — I517 Cardiomegaly: Secondary | ICD-10-CM | POA: Diagnosis not present

## 2020-10-03 DIAGNOSIS — E785 Hyperlipidemia, unspecified: Secondary | ICD-10-CM | POA: Diagnosis not present

## 2020-10-03 DIAGNOSIS — Z20822 Contact with and (suspected) exposure to covid-19: Secondary | ICD-10-CM | POA: Diagnosis not present

## 2020-10-03 DIAGNOSIS — Z9079 Acquired absence of other genital organ(s): Secondary | ICD-10-CM

## 2020-10-03 DIAGNOSIS — I5043 Acute on chronic combined systolic (congestive) and diastolic (congestive) heart failure: Secondary | ICD-10-CM | POA: Diagnosis not present

## 2020-10-03 DIAGNOSIS — I130 Hypertensive heart and chronic kidney disease with heart failure and stage 1 through stage 4 chronic kidney disease, or unspecified chronic kidney disease: Secondary | ICD-10-CM | POA: Diagnosis not present

## 2020-10-03 DIAGNOSIS — D649 Anemia, unspecified: Secondary | ICD-10-CM | POA: Diagnosis not present

## 2020-10-03 DIAGNOSIS — Z955 Presence of coronary angioplasty implant and graft: Secondary | ICD-10-CM

## 2020-10-03 DIAGNOSIS — N2581 Secondary hyperparathyroidism of renal origin: Secondary | ICD-10-CM | POA: Diagnosis not present

## 2020-10-03 DIAGNOSIS — R062 Wheezing: Secondary | ICD-10-CM | POA: Diagnosis not present

## 2020-10-03 DIAGNOSIS — Z7982 Long term (current) use of aspirin: Secondary | ICD-10-CM

## 2020-10-03 DIAGNOSIS — R059 Cough, unspecified: Secondary | ICD-10-CM | POA: Diagnosis not present

## 2020-10-03 DIAGNOSIS — R918 Other nonspecific abnormal finding of lung field: Secondary | ICD-10-CM | POA: Diagnosis not present

## 2020-10-03 DIAGNOSIS — Z86711 Personal history of pulmonary embolism: Secondary | ICD-10-CM

## 2020-10-03 DIAGNOSIS — Z8249 Family history of ischemic heart disease and other diseases of the circulatory system: Secondary | ICD-10-CM

## 2020-10-03 DIAGNOSIS — Z8674 Personal history of sudden cardiac arrest: Secondary | ICD-10-CM

## 2020-10-03 HISTORY — DX: Chronic pulmonary edema: J81.1

## 2020-10-03 LAB — CBC WITH DIFFERENTIAL/PLATELET
Abs Immature Granulocytes: 0.09 10*3/uL — ABNORMAL HIGH (ref 0.00–0.07)
Basophils Absolute: 0.1 10*3/uL (ref 0.0–0.1)
Basophils Relative: 1 %
Eosinophils Absolute: 0 10*3/uL (ref 0.0–0.5)
Eosinophils Relative: 0 %
HCT: 38.1 % — ABNORMAL LOW (ref 39.0–52.0)
Hemoglobin: 11.8 g/dL — ABNORMAL LOW (ref 13.0–17.0)
Immature Granulocytes: 1 %
Lymphocytes Relative: 6 %
Lymphs Abs: 0.6 10*3/uL — ABNORMAL LOW (ref 0.7–4.0)
MCH: 33.1 pg (ref 26.0–34.0)
MCHC: 31 g/dL (ref 30.0–36.0)
MCV: 107 fL — ABNORMAL HIGH (ref 80.0–100.0)
Monocytes Absolute: 0.9 10*3/uL (ref 0.1–1.0)
Monocytes Relative: 10 %
Neutro Abs: 7.9 10*3/uL — ABNORMAL HIGH (ref 1.7–7.7)
Neutrophils Relative %: 82 %
Platelets: 191 10*3/uL (ref 150–400)
RBC: 3.56 MIL/uL — ABNORMAL LOW (ref 4.22–5.81)
RDW: 14.4 % (ref 11.5–15.5)
WBC: 9.6 10*3/uL (ref 4.0–10.5)
nRBC: 0 % (ref 0.0–0.2)

## 2020-10-03 LAB — TROPONIN I (HIGH SENSITIVITY)
Troponin I (High Sensitivity): 49 ng/L — ABNORMAL HIGH (ref <18)
Troponin I (High Sensitivity): 76 ng/L — ABNORMAL HIGH (ref <18)

## 2020-10-03 LAB — RESP PANEL BY RT-PCR (FLU A&B, COVID) ARPGX2
Influenza A by PCR: NEGATIVE
Influenza B by PCR: NEGATIVE
SARS Coronavirus 2 by RT PCR: NEGATIVE

## 2020-10-03 LAB — BASIC METABOLIC PANEL
Anion gap: 12 (ref 5–15)
BUN: 32 mg/dL — ABNORMAL HIGH (ref 8–23)
CO2: 21 mmol/L — ABNORMAL LOW (ref 22–32)
Calcium: 9.1 mg/dL (ref 8.9–10.3)
Chloride: 110 mmol/L (ref 98–111)
Creatinine, Ser: 2.85 mg/dL — ABNORMAL HIGH (ref 0.61–1.24)
GFR, Estimated: 23 mL/min — ABNORMAL LOW (ref >60)
Glucose, Bld: 116 mg/dL — ABNORMAL HIGH (ref 70–99)
Potassium: 3.7 mmol/L (ref 3.5–5.1)
Sodium: 143 mmol/L (ref 135–145)

## 2020-10-03 LAB — BRAIN NATRIURETIC PEPTIDE: B Natriuretic Peptide: 672.7 pg/mL — ABNORMAL HIGH (ref 0.0–100.0)

## 2020-10-03 LAB — PROCALCITONIN: Procalcitonin: 0.13 ng/mL

## 2020-10-03 MED ORDER — SERTRALINE HCL 25 MG PO TABS
25.0000 mg | ORAL_TABLET | Freq: Every day | ORAL | Status: DC
  Administered 2020-10-04 – 2020-10-07 (×4): 25 mg via ORAL
  Filled 2020-10-03 (×4): qty 1

## 2020-10-03 MED ORDER — PANTOPRAZOLE SODIUM 40 MG IV SOLR
40.0000 mg | Freq: Once | INTRAVENOUS | Status: AC
  Administered 2020-10-03: 40 mg via INTRAVENOUS
  Filled 2020-10-03: qty 40

## 2020-10-03 MED ORDER — HEPARIN SODIUM (PORCINE) 5000 UNIT/ML IJ SOLN
5000.0000 [IU] | Freq: Three times a day (TID) | INTRAMUSCULAR | Status: DC
  Administered 2020-10-03 – 2020-10-07 (×11): 5000 [IU] via SUBCUTANEOUS
  Filled 2020-10-03 (×11): qty 1

## 2020-10-03 MED ORDER — ISOSORBIDE DINITRATE 10 MG PO TABS
30.0000 mg | ORAL_TABLET | Freq: Two times a day (BID) | ORAL | Status: DC
  Administered 2020-10-03 – 2020-10-06 (×7): 30 mg via ORAL
  Filled 2020-10-03 (×7): qty 3

## 2020-10-03 MED ORDER — OXYCODONE-ACETAMINOPHEN 5-325 MG PO TABS: 1.0000 | ORAL_TABLET | ORAL | Status: DC | PRN

## 2020-10-03 MED ORDER — SODIUM CHLORIDE 0.9% FLUSH: 3.0000 mL | INTRAVENOUS | Status: DC | PRN

## 2020-10-03 MED ORDER — SODIUM CHLORIDE 0.9 % IV SOLN
1.0000 g | INTRAVENOUS | Status: DC
  Administered 2020-10-04: 1 g via INTRAVENOUS
  Filled 2020-10-03: qty 1
  Filled 2020-10-03: qty 10

## 2020-10-03 MED ORDER — ALBUTEROL SULFATE (2.5 MG/3ML) 0.083% IN NEBU
5.0000 mg | INHALATION_SOLUTION | Freq: Once | RESPIRATORY_TRACT | Status: AC
  Administered 2020-10-03: 5 mg via RESPIRATORY_TRACT
  Filled 2020-10-03: qty 6

## 2020-10-03 MED ORDER — CLONAZEPAM 0.25 MG PO TBDP
0.5000 mg | ORAL_TABLET | Freq: Two times a day (BID) | ORAL | Status: DC
  Administered 2020-10-03 – 2020-10-07 (×8): 0.5 mg via ORAL
  Filled 2020-10-03 (×8): qty 2

## 2020-10-03 MED ORDER — ASPIRIN EC 81 MG PO TBEC
81.0000 mg | DELAYED_RELEASE_TABLET | Freq: Every day | ORAL | Status: DC
  Administered 2020-10-04 – 2020-10-07 (×4): 81 mg via ORAL
  Filled 2020-10-03 (×4): qty 1

## 2020-10-03 MED ORDER — SODIUM CHLORIDE 0.9 % IV SOLN
1.0000 g | Freq: Once | INTRAVENOUS | Status: AC
  Administered 2020-10-03: 1 g via INTRAVENOUS
  Filled 2020-10-03: qty 10

## 2020-10-03 MED ORDER — SODIUM CHLORIDE 0.9 % IV SOLN
500.0000 mg | INTRAVENOUS | Status: DC
  Administered 2020-10-04: 500 mg via INTRAVENOUS
  Filled 2020-10-03 (×2): qty 500

## 2020-10-03 MED ORDER — ATORVASTATIN CALCIUM 10 MG PO TABS
20.0000 mg | ORAL_TABLET | Freq: Every evening | ORAL | Status: DC
  Administered 2020-10-03 – 2020-10-06 (×4): 20 mg via ORAL
  Filled 2020-10-03 (×4): qty 2

## 2020-10-03 MED ORDER — FUROSEMIDE 10 MG/ML IJ SOLN
80.0000 mg | Freq: Once | INTRAMUSCULAR | Status: AC
  Administered 2020-10-03: 80 mg via INTRAVENOUS
  Filled 2020-10-03: qty 8

## 2020-10-03 MED ORDER — ONDANSETRON HCL 4 MG/2ML IJ SOLN: 4.0000 mg | Freq: Four times a day (QID) | INTRAMUSCULAR | Status: DC | PRN

## 2020-10-03 MED ORDER — SODIUM CHLORIDE 0.9 % IV SOLN: 250.0000 mL | INTRAVENOUS | Status: DC | PRN

## 2020-10-03 MED ORDER — METOPROLOL SUCCINATE ER 100 MG PO TB24
200.0000 mg | ORAL_TABLET | Freq: Every day | ORAL | Status: DC
  Administered 2020-10-04 – 2020-10-05 (×2): 200 mg via ORAL
  Filled 2020-10-03 (×2): qty 2

## 2020-10-03 MED ORDER — SODIUM CHLORIDE 0.9% FLUSH
3.0000 mL | Freq: Two times a day (BID) | INTRAVENOUS | Status: DC
  Administered 2020-10-03 – 2020-10-07 (×6): 3 mL via INTRAVENOUS

## 2020-10-03 MED ORDER — ONDANSETRON HCL 4 MG PO TABS: 4.0000 mg | ORAL_TABLET | Freq: Four times a day (QID) | ORAL | Status: DC | PRN

## 2020-10-03 MED ORDER — SODIUM CHLORIDE 0.9 % IV SOLN
500.0000 mg | Freq: Once | INTRAVENOUS | Status: AC
  Administered 2020-10-03: 500 mg via INTRAVENOUS
  Filled 2020-10-03: qty 500

## 2020-10-03 NOTE — Progress Notes (Signed)
Pt received from ED. BP 216/100 (133). Pt audibly wheezing and RR 41. 2L [place] applied. Telemetry applied. Pt and wife oriented to room and unit.C all light in reach.  Clyde Canterbury, RN

## 2020-10-03 NOTE — ED Triage Notes (Signed)
Patient sent to ED for evaluation of wheezing. Patient complains of nasal congestion that started yesterday. Also complains of lower extremity edema. Patient alert, oriented, and in no apparent distress at this time.

## 2020-10-03 NOTE — ED Provider Notes (Signed)
Emergency Medicine Provider Triage Evaluation Note  Christian [name] , [name] 75 y.o. male  was evaluated in triage.  Pt complains of cough, SOB, congestion.  ESRD patient.  States had fistula placed to left upper extremity.  He states he is creatinine had improved and he has not needed dialysis recently. Increased swelling to LLE history of same however worse. Has been wheezing at home. Hx of COPD. He is compliant with home meds. On plavix. No CP.  Review of Systems  Positive: SOB, Cough, LE swelling Negative: CP, syncope  Physical Exam  BP (!) 169/99 (BP Location: Right Arm)   Pulse (!) 108   Temp 98.6 F (37 C) (Oral)   SpO2 95%  Gen:   Awake, no distress   Resp:  Normal effort, course lung sounds, speack in full sentences without difficulty MSK:   Moves extremities without difficulty. LLE swelling. Fistual with thrill to LUE Other:    Medical Decision Making  Medically screening exam initiated at 1:15 PM.  Appropriate orders placed.  Christian [name] was informed that the remainder of the evaluation will be completed by another provider, this initial triage assessment does not replace that evaluation, and the importance of remaining in the ED until their evaluation is complete.  Cough, SOB   Christian Hanover A, PA-C 10/03/20 1317    Valarie Merino, MD 10/06/20 1123

## 2020-10-03 NOTE — Telephone Encounter (Signed)
[facility] FYI:  --Caller stated that he has cough, head congestion, and difficulty breathing through his nose (not his mouth). No fever.  -Advised to go to ED now

## 2020-10-03 NOTE — ED Notes (Signed)
I cleaned pt and applied a clean brief and sheets.

## 2020-10-03 NOTE — Consult Note (Addendum)
Nephrology Consult   Requesting provider: Oswald Hillock, MD  Assessment/Recommendations:   CKD4 (stable): Cr currently is around baseline -no indication for renal replacement therapy, questionable if his SOB is CHF related, may very well be secondary to bronchitis. If respiratory status worsens and if his volume status worsens and does not respond to diuresis, they would consider renal replacement therapy -please obtain renal u/s and UA w/ microscopy if Cr worsens -Continue to monitor daily Cr, Dose meds for GFR<15 -Monitor Daily I/Os, Daily weight  -Maintain MAP>65 for optimal renal perfusion.  -Avoid further nephrotoxins including NSAIDS, Morphine.  Unless absolutely necessary, avoid CT with contrast and/or MRI with gadolinium.     Shortness of breath, left pleural effusion: CAP vs volume overload -can give a dose of IV lasix 80mg  x 1 dose given asymmetric pleural effusion (could be infection related/CAP? Does have dCHF) -rocephin/zithromax and work up per primary service -possible thora if no improvement?  History of CHF -bnp elevated, is volume up on exam -one dose of lasix 80mg  IV, can resume home oral diuretic regimen tomorrow  Hypertension: -resume home anti-HTNs  Metabolic acidosis -bicarb slightly low, down to 21, monitor for now, if consistently <22 then would consider nahco3 supplementation  Macrocytic Anemia -Transfuse for Hgb<7 g/dL, hgb stable -consider checking iron panel, b12, folate  Diabetes Mellitus Type 2 with Hyperglycemia -mgmt per primary service  S/P 1st stage basilic vein AVF (Dr. Trula Slade 09/18/2020) -following with [facility], f/u scheduled on 6/27  Recommendations conveyed to primary service.    [facility] 10/03/2020 5:00 PM   _____________________________________________________________________________________   History of Present Illness: Christian [name] is a/an 75 y.o. male with a past medical history of CKD 4, history  of dialysis dependent AKI (off dialysis since October 2021), CAD status post CABG, history of cardiogenic shock and cardiac arrest, PE, A. fib status post cardioversion, right brain infarct, DM2, hypertension, BPH, hyperlipidemia who presents to [facility] with shortness of breath for 2 days, worsening.  Associated with chills and productive cough with yellowish sputum.  He is status post left for stage basilic vein fistula placement on 09/18/2020.  Baseline creatinine around 2.9.  His most recent blood work from 5/25 revealed a creatinine of 3.2.  Here his creatinine is 2.9 today. His chest x-ray today revealed a moderate left pleural effusion and possible left lower lobe atelectasis versus infiltrate.  He has been placed on Rocephin and Zithromax. Follows with Dr. Royce Macadamia for CKD care. He currently reports that he is still SOB but stable. Does have chronic edema particularly in his LLE which 'comes and goes'. He does report that his leg is more swollen than usual. Wife at bedside. Denies fevers, chills, chest pain, changes in urinary frequency, n/v.   Medications:  Current Facility-Administered Medications  Medication Dose Route Frequency Provider Last Rate Last Admin   azithromycin (ZITHROMAX) 500 mg in sodium chloride 0.9 % 250 mL IVPB  500 mg Intravenous Once Pattricia Boss, MD 250 mL/hr at 10/03/20 1616 500 mg at 10/03/20 1616   furosemide (LASIX) injection 80 mg  80 mg Intravenous Once [place], MD       Current Outpatient Medications  Medication Sig Dispense Refill   aspirin EC 81 MG tablet Take 81 mg by mouth daily.     atorvastatin (LIPITOR) 20 MG tablet Take 1 tablet (20 mg total) by mouth every evening. (Patient taking differently: Take 20 mg by mouth at bedtime.) 90 tablet 3   clonazePAM (KLONOPIN)  0.5 MG disintegrating tablet TAKE 1 TABLET BY MOUTH TWICE A DAY (Patient taking differently: Take 0.5 mg by mouth 2 (two) times daily.) 60 tablet 5   clopidogrel (PLAVIX) 75 MG tablet Take 1  tablet by mouth daily.     cromolyn (OPTICROM) 4 % ophthalmic solution Place 1 drop into both eyes every Wednesday.     [employer] ACETAMINOPHEN 325 MG tablet TAKE 2 CAPS BY MOUTH EVERY 6 HOURS AS NEEDED FOR PAIN FOR TEMP 100F OR ABOVE (Patient taking differently: Take 325 mg by mouth every 6 (six) hours as needed for moderate pain or mild pain.) 60 tablet 0   [employer] SENNA PLUS 8.6-50 MG tablet TAKE 2 TABLETS BY MOUTH AT BEDTIME FOR CONSTIPATION (Patient taking differently: Take 2 tablets by mouth at bedtime.) 60 tablet 1   ferrous sulfate 325 (65 FE) MG tablet Take 325 mg by mouth daily with breakfast.     furosemide (LASIX) 80 MG tablet Take 80 mg by mouth 2 (two) times daily with breakfast and lunch. Morning and miday     hydrALAZINE (APRESOLINE) 25 MG tablet Take 1 tablet (25 mg total) by mouth 3 (three) times daily. 270 tablet 3   isosorbide dinitrate (ISORDIL) 30 MG tablet TAKE 1 TABLET BY MOUTH 3 TIMES DAILY. (Patient taking differently: Take 30 mg by mouth 2 (two) times daily.) 270 tablet 1   metoprolol succinate (TOPROL-XL) 200 MG 24 hr tablet Take 1 tablet (200 mg total) by mouth daily. Take with or immediately following a meal. 90 tablet 3   multivitamin (RENA-VIT) TABS tablet Take 1 tablet by mouth at bedtime.  0   nitroGLYCERIN (NITROSTAT) 0.4 MG SL tablet Place 0.4 mg under the tongue every 5 (five) minutes as needed for chest pain.     oxycodone (OXY-IR) 5 MG capsule Take 5 mg by mouth every 4 (four) hours as needed for pain.     oxyCODONE-acetaminophen (PERCOCET) 5-325 MG tablet Take 1 tablet by mouth every 4 (four) hours as needed for severe pain. 5 tablet 0   PREVIDENT 5000 BOOSTER PLUS 1.1 % PSTE Take 1 application by mouth daily.      sertraline (ZOLOFT) 25 MG tablet TAKE 1 TABLET ONE TIME A DAY (Patient taking differently: Take 25 mg by mouth daily.) 90 tablet 1   silodosin (RAPAFLO) 8 MG CAPS capsule TAKE 1 CAPSULE BY MOUTH EVERYDAY AT BEDTIME 90 capsule 3   Vitamin D, Ergocalciferol,  (DRISDOL) 1.25 MG (50000 UNIT) CAPS capsule Take 1 capsule (50,000 Units total) by mouth every 7 (seven) days. 12 capsule 3     ALLERGIES Patient has no known allergies.  MEDICAL HISTORY Past Medical History:  Diagnosis Date   Anemia    low iron   BPH (benign prostatic hypertrophy)    CHF (congestive heart failure) (HCC)    Chronic kidney disease    Coronary artery disease    Dry eyes left   Hiatal hernia    Hx of CABG 08/15/2019: x 4 using bilateral IMAs and left radial artery .  LIMA TO LAD, RIMA TO PDA, RADIAL ARTERY TO CIRC AND SEQUENTIALLY TO OM1. 08/15/2019   Hyperlipidemia    Hypertension    Incomplete bladder emptying    Myocardial infarction ([place]) 08/09/2019   Nocturia    PE (pulmonary thromboembolism) ([place]) 08/09/2019   small RLL PE 08/09/19   Pneumonia    as a child   Pre-diabetes    Problems with swallowing pt states test at [facility] approx 2012 shows  a gastric valve  dysfunction--  eats small bites and drink liquids slowly   SOB (shortness of breath) on exertion      SOCIAL HISTORY Social History   Socioeconomic History   Marital status: Married    Spouse name: Not on file   Number of children: 2   Years of education: Not on file   Highest education level: Associate degree: academic program  Occupational History   Occupation: Retired  Tobacco Use   Smoking status: Former    Packs/day: 0.50    Years: 20.00    Pack years: 10.00    Types: Cigarettes    Quit date: 1975    Years since quitting: 47.4   Smokeless tobacco: Never  Vaping Use   Vaping Use: Never used  Substance and Sexual Activity   Alcohol use: Not Currently    Alcohol/week: 0.0 standard drinks    Comment: 1 beer week rarely   Drug use: No   Sexual activity: Not Currently  Other Topics Concern   Not on file  Social History Narrative   Not on file   Social Determinants of Health   Financial Resource Strain: Low Risk    Difficulty of Paying Living Expenses: Not very hard  Food  Insecurity: Not on file  Transportation Needs: Not on file  Physical Activity: Not on file  Stress: Not on file  Social Connections: Not on file  Intimate Partner Violence: Not on file     FAMILY HISTORY Family History  Problem Relation Age of Onset   Diabetes Mother    Hypertension Mother    Diabetes Father    Diabetes Brother    Hypertension Brother    Bone cancer Brother    Diabetes Brother     Review of Systems: 12 systems reviewed Otherwise as per HPI, all other systems reviewed and negative  Physical Exam: Vitals:   10/03/20 1600 10/03/20 1630  BP: (!) 169/91 (!) 167/95  Pulse: 90 91  Resp: (!) 29 (!) 29  Temp:    SpO2: 98% 93%   Total I/O In: 100 [IV Piggyback:100] Out: -   Intake/Output Summary (Last 24 hours) at 10/03/2020 1700 Last data filed at 10/03/2020 1645 Gross per 24 hour  Intake 100 ml  Output --  Net 100 ml   General: well-appearing, no acute distress, sitting up in bed HEENT: anicteric sclera, oropharynx clear without lesions CV: regular rate, normal rhythm, no murmurs, no gallops, no rubs Lungs: decreased breath sounds left base, right fields CTA, increased work of breathing, able to speak in full sentences, sitting up in bed, no acc muscle use Abd: soft, non-tender, non-distended Skin: no visible lesions or rashes Psych: alert, engaged, appropriate mood and affect Musculoskeletal: trace to 1+ pitting edema LLE, trace edema RLE Neuro: normal speech, no gross focal deficits   Test Results Reviewed Lab Results  Component Value Date   NA 143 10/03/2020   K 3.7 10/03/2020   CL 110 10/03/2020   CO2 21 (L) 10/03/2020   BUN 32 (H) 10/03/2020   CREATININE 2.85 (H) 10/03/2020   GFR 17.66 (L) 05/28/2020   CALCIUM 9.1 10/03/2020   ALBUMIN 4.1 05/28/2020   PHOS 4.6 03/25/2020     I have reviewed all relevant outside healthcare records related to the patient's kidney injury.

## 2020-10-03 NOTE — ED Notes (Signed)
Pt stated swelling in left leg is something he has been dealing with. "It swells sometimes, and then it goes away;" stated by the pt. I just noticed swelling no pitting in the left leg.

## 2020-10-03 NOTE — Telephone Encounter (Signed)
Fyi.

## 2020-10-03 NOTE — Telephone Encounter (Signed)
Patient said that he has a cough, congestion and difficulty breathing. Transferred to [facility]

## 2020-10-03 NOTE — ED Provider Notes (Signed)
[facility] EMERGENCY DEPARTMENT Provider Note   CSN: [id number] Arrival date & time: 10/03/20  1303     History Chief Complaint  Patient presents with   Shortness of Breath    Christian [name] is a 75 y.o. male.  HPI 75 year old male history of CHF, CKD, coronary artery disease, hyperlipidemia, hypertension, PE presents today complaining of dyspnea.  Patient states that 2 days ago he began having some increased shortness of breath.  He has had some chills and cough productive of yellowish sputum.  Patient had surgery for fistula placement 2 days ago.  Denies COVID or COVID exposure and has had 2 COVID shots and 1 booster.  Patient was seen in triage and noted to have some peripheral edema.  He had Doppler studies ordered from triage.  He denies any chest pain.  He endorses that he has had some discharge from the surgical site noted today.    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  LIMA TO LAD, RIMA TO PDA, RADIAL ARTERY TO CIRC AND SEQUENTIALLY TO OM1. 08/15/2019   Hyperlipidemia    Hypertension    Incomplete bladder emptying    Myocardial infarction ([place]) 08/09/2019   Nocturia    PE (pulmonary thromboembolism) ([place]) 08/09/2019   small RLL PE 08/09/19   Pneumonia    as a child   Pre-diabetes    Problems with swallowing pt states test at [facility] approx 2012 shows a gastric valve  dysfunction--  eats small bites and drink liquids slowly   SOB (shortness of breath) on exertion     Patient Active Problem List   Diagnosis Date Noted   Sleep difficulties 05/30/2020   Poor appetite 05/30/2020   Anemia due to blood loss 04/24/2020   CKD (chronic kidney disease) stage 4, GFR 15-29 ml/min (HCC) 03/27/2020    Aortic atherosclerosis ([place]) 03/27/2020   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   Pruritus, unspecified 11/28/2019   Unspecified protein-calorie malnutrition ([place]) 10/27/2019   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  LIMA TO LAD, RIMA TO PDA, RADIAL ARTERY TO CIRC AND SEQUENTIALLY TO OM1. 08/15/2019   Ischemic cardiomyopathy    Swelling of lower extremity    History of coronary artery stent placement    Pulmonary embolism ([place]) 08/09/2019   Elevated liver enzymes 08/09/2019   Left leg weakness 11/23/2018   CAD (coronary artery disease) 07/30/2018   Other fatigue 10/19/2017   Routine general medical examination at a health care facility 08/30/2016   Diabetes mellitus with polyneuropathy ([place]) 10/10/2014   Benign prostatic hyperplasia 09/27/2014   Stable angina pectoris ([place]) 02/27/2014   S/P PTCA (percutaneous transluminal coronary angioplasty) 02/27/2014   Hyperlipidemia associated with type 2 diabetes mellitus ([place]) 11/12/2009   MULTIPLE SCLEROSIS 11/12/2009   HTN (hypertension) 11/12/2009    Past Surgical History:  Procedure Laterality Date   APPLICATION OF WOUND VAC N/A 08/24/2019   Procedure: APPLICATION OF WOUND VAC;  Surgeon: [name], MD;  Location: [place];  Service: Thoracic;  Laterality: N/A;   APPLICATION OF WOUND VAC  08/29/2019   Procedure: Wound Vac change;  Surgeon: [name], MD;  Location: [place] OR;  Service: Open Heart Surgery;;   APPLICATION OF WOUND VAC N/A 09/04/2019   Procedure: Application Of Wound Vac;  Surgeon: Grace Lennell, MD;  Location: [place];  Service: Open Heart Surgery;  Laterality: N/A;   APPLICATION OF WOUND VAC N/A 09/06/2019   Procedure: APPLICATION OF ACELL, APPLICATION OF WOUND VAC USING PREVENA INCISIONAL  DRESSING;  Surgeon: Wallace Going, DO;  Location: [place];  Service: Plastics;   Laterality: N/A;   AV FISTULA PLACEMENT Left 09/18/2020   Procedure: ARTERIOVENOUS (AV) FISTULA CREATION LEFT VERSUS GRAFT;  Surgeon: Serafina Mitchell, MD;  Location: [place];  Service: Vascular;  Laterality: Left;   CARDIAC CATHETERIZATION     CORONARY ARTERY BYPASS GRAFT N/A 08/15/2019   Procedure: CORONARY ARTERY BYPASS GRAFTING (CABG), x 4 using bilateral IMAs and left radial artery .  LIMA TO LAD, RIMA TO PDA, RADIAL ARTERY TO CIRC AND SEQUENTIALLY TO OM1.;  Surgeon: [name], MD;  Location: [place];  Service: Open Heart Surgery;  Laterality: N/A;   CORONARY STENT PLACEMENT  02/27/2014   distal rt/pd coronary       dr Einar Gip   CYSTO/ BLADDER BIOPSY'S/ CAUTHERIZATION  01-14-2004  DR Gaynelle Arabian   EXPLORATION POST OPERATIVE OPEN HEART N/A 08/16/2019   Procedure: Chest Closure S?P CABG WITH APPLICATION OF PREVENA  INCISIONAL WOUND VAC;  Surgeon: [name], MD;  Location: [place];  Service: Open Heart Surgery;  Laterality: N/A;   EXPLORATION POST OPERATIVE OPEN HEART N/A 08/21/2019   Procedure: CHEST WASHOUT S/P OPEN CHEST;  Surgeon: [name], MD;  Location: [place];  Service: Open Heart Surgery;  Laterality: N/A;  Open chest with Esmark dressing with Ioban sealant coverage.   EXPLORATION POST OPERATIVE OPEN HEART N/A 08/18/2019   Procedure: EXPLORATION POST OPERATIVE OPEN HEART (performed 04/23 on unit);  Surgeon: [name], MD;  Location: [place];  Service: Open Heart Surgery;  Laterality: N/A;   EXPLORATION POST OPERATIVE OPEN HEART N/A 08/24/2019   Procedure: CHEST WASHOUT POST OPERATIVE OPEN HEART;  Surgeon: [name], MD;  Location: [place];  Service: Open Heart Surgery;  Laterality: N/A;   EXPLORATION POST OPERATIVE OPEN HEART N/A 08/29/2019   Procedure: CHEST WOUND WASHOUT POST OPERATIVE OPEN HEART;  Surgeon: [name], MD;  Location: [place];  Service: Open Heart Surgery;  Laterality: N/A;   EXPLORATION POST OPERATIVE OPEN HEART N/A 09/04/2019   Procedure:  MEDIASTINAL EXPLORATION WITH STERNAL WOUND IRRIGATION;  Surgeon: Grace Strummer, MD;  Location: [place];  Service: Open Heart Surgery;  Laterality: N/A;   EXPLORATION POST OPERATIVE OPEN HEART N/A 09/14/2019   Procedure: EVACUATION OF HEMATOMA;  Surgeon: Grace Markell, MD;  Location: [place];  Service: Open Heart Surgery;  Laterality: N/A;   IR FLUORO GUIDE CV LINE LEFT  09/19/2019   IR GASTROSTOMY TUBE MOD SED  10/04/2019   IR GASTROSTOMY TUBE REMOVAL  11/29/2019   IR PATIENT EVAL TECH 0-60 MINS  09/29/2019   IR US GUIDE VASC ACCESS LEFT  09/19/2019   LAPAROSCOPIC LYSIS OF ADHESIONS N/A 09/06/2019   Procedure: LAPAROSCOPIC OMENTAL HARVEST;  Surgeon: Mickeal Skinner, MD;  Location: [place];  Service: General;  Laterality: N/A;   LEFT HEART CATH AND CORONARY ANGIOGRAPHY N/A 08/10/2019   Procedure: LEFT HEART CATH AND CORONARY ANGIOGRAPHY;  Surgeon:  Patwardhan, Reynold Bowen, MD;  Location: [place] CV LAB;  Service: Cardiovascular;  Laterality: N/A;   LEFT HEART CATHETERIZATION WITH CORONARY ANGIOGRAM N/A 02/27/2014   Procedure: LEFT HEART CATHETERIZATION WITH CORONARY ANGIOGRAM;  Surgeon: Laverda Page, MD;  Location: [facility] C[facility] LAB;  Service: Cardiovascular;  Laterality: N/A;   MEDIASTINAL EXPLORATION N/A 09/06/2019   Procedure: MEDIASTINAL EXPLORATION;  Surgeon: Grace Christhoper, MD;  Location: [place];  Service: Thoracic;  Laterality: N/A;   PECTORALIS FLAP  09/06/2019   Procedure: Pectoralis ADVANCEMENT Flap;  Surgeon: Wallace Going, DO;  Location: [place];  Service: Plastics;;   PERCUTANEOUS CORONARY STENT INTERVENTION (PCI-S)  02/27/2014   Procedure: PERCUTANEOUS CORONARY STENT INTERVENTION (PCI-S);  Surgeon: Laverda Page, MD;  Location: [facility] CATH LAB;  Service: Cardiovascular;;  rt PDA  3.0/28mm Promus stent   RADIAL ARTERY HARVEST Left 08/15/2019   Procedure: Radial Artery Harvest;  Surgeon: [name], MD;  Location: [place];  Service: Open Heart Surgery;  Laterality: Left;   RIB  PLATING N/A 09/06/2019   Procedure: STERNAL PLATING;  Surgeon: Grace Zacheriah, MD;  Location: [place];  Service: Thoracic;  Laterality: N/A;   TEE WITHOUT CARDIOVERSION N/A 08/15/2019   Procedure: TRANSESOPHAGEAL ECHOCARDIOGRAM (TEE);  Surgeon: [name], MD;  Location: [place];  Service: Open Heart Surgery;  Laterality: N/A;   TRACHEOSTOMY TUBE PLACEMENT  08/29/2019   Procedure: Tracheostomy;  Surgeon: [name], MD;  Location: [facility];  Service: Open Heart Surgery; Decannulated 6/10/20211   TRANSURETHRAL RESECTION OF PROSTATE  04/04/2012   Procedure: TRANSURETHRAL RESECTION OF THE PROSTATE WITH GYRUS INSTRUMENTS;  Surgeon: Ailene Rud, MD;  Location: [facility];  Service: Urology;  Laterality: N/A;   TRANSURETHRAL RESECTION OF PROSTATE N/A 09/27/2014   Procedure: TRANSURETHRAL RESECTION OF THE PROSTATE ;  Surgeon: Carolan Clines, MD;  Location: [facility];  Service: Urology;  Laterality: N/A;   UPPER GASTROINTESTINAL ENDOSCOPY         Family History  Problem Relation Age of Onset   Diabetes Mother    Hypertension Mother    Diabetes Father    Diabetes Brother    Hypertension Brother    Bone cancer Brother    Diabetes Brother     Social History   Tobacco Use   Smoking status: Former    Packs/day: 0.50    Years: 20.00    Pack years: 10.00    Types: Cigarettes    Quit date: 1975    Years since quitting: 47.4   Smokeless tobacco: Never  Vaping Use   Vaping Use: Never used  Substance Use Topics   Alcohol use: Not Currently    Alcohol/week: 0.0 standard drinks    Comment: 1 beer week rarely   Drug use: No    Home Medications Prior to Admission medications   Medication Sig Start Date End Date Taking? Authorizing Provider  aspirin EC 81 MG tablet Take 81 mg by mouth daily.    [provider]  atorvastatin (LIPITOR) 20 MG tablet Take 1 tablet (20 mg total) by mouth every evening. Patient taking differently: Take 20 mg by mouth at  bedtime. 07/18/20   Adrian Prows, MD  clonazePAM (KLONOPIN) 0.5 MG disintegrating tablet TAKE 1 TABLET BY MOUTH TWICE A DAY Patient taking differently: Take 0.5 mg by mouth 2 (two) times daily. 04/01/20   Hoyt Koch, MD  clopidogrel (PLAVIX) 75 MG tablet Take 1 tablet by mouth daily. 09/09/20   [provider]  cromolyn (OPTICROM) 4 % ophthalmic solution Place 1 drop into both eyes every Wednesday. 12/08/19   [provider]  [employer] ACETAMINOPHEN 325 MG tablet TAKE 2 CAPS BY MOUTH EVERY 6 HOURS AS NEEDED FOR PAIN FOR TEMP 100F OR ABOVE Patient taking differently: Take 325 mg by mouth every 6 (six) hours as needed for moderate pain or mild pain. 02/23/20   Hoyt Koch, MD  [employer] SENNA PLUS 8.6-50 MG tablet TAKE 2 TABLETS BY MOUTH AT BEDTIME FOR CONSTIPATION Patient taking differently: Take 2 tablets by mouth at bedtime. 06/17/20   Hoyt Koch, MD  ferrous sulfate 325 (65 FE) MG tablet Take 325 mg by mouth daily with breakfast.    [provider]  furosemide (LASIX) 80 MG tablet Take 80 mg by mouth 2 (two) times daily with breakfast and lunch. Morning and miday    [provider]  hydrALAZINE (APRESOLINE) 25 MG tablet Take 1 tablet (25 mg total) by mouth 3 (three) times daily. 05/02/20 04/27/21  Adrian Prows, MD  isosorbide dinitrate (ISORDIL) 30 MG tablet TAKE 1 TABLET BY MOUTH 3 TIMES DAILY. Patient taking differently: Take 30 mg by mouth 2 (two) times daily. 06/03/20   Adrian Prows, MD  metoprolol succinate (TOPROL-XL) 200 MG 24 hr tablet Take 1 tablet (200 mg total) by mouth daily. Take with or immediately following a meal. 07/11/20   Adrian Prows, MD  multivitamin (RENA-VIT) TABS tablet Take 1 tablet by mouth at bedtime. 10/16/19   Barrett, Erin R, PA-C  nitroGLYCERIN (NITROSTAT) 0.4 MG SL tablet Place 0.4 mg under the tongue every 5 (five) minutes as needed for chest pain. 10/25/19   [provider]  oxycodone (OXY-IR) 5 MG capsule Take 5 mg by  mouth every 4 (four) hours as needed for pain.    [provider]  oxyCODONE-acetaminophen (PERCOCET) 5-325 MG tablet Take 1 tablet by mouth every 4 (four) hours as needed for severe pain. 09/18/20 09/18/21  Serafina Mitchell, MD  PREVIDENT 5000 BOOSTER PLUS 1.1 % PSTE Take 1 application by mouth daily.  06/14/18   [provider]  sertraline (ZOLOFT) 25 MG tablet TAKE 1 TABLET ONE TIME A DAY Patient taking differently: Take 25 mg by mouth daily. 07/16/20   Hoyt Koch, MD  silodosin (RAPAFLO) 8 MG CAPS capsule TAKE 1 CAPSULE BY MOUTH EVERYDAY AT BEDTIME 09/24/20   McKenzie, Candee Furbish, MD  Vitamin D, Ergocalciferol, (DRISDOL) 1.25 MG (50000 UNIT) CAPS capsule Take 1 capsule (50,000 Units total) by mouth every 7 (seven) days. 06/28/20   Hoyt Koch, MD    Allergies    Patient has no known allergies.  Review of Systems   Review of Systems  All other systems reviewed and are negative.  Physical Exam Updated Vital Signs BP (!) 158/99   Pulse (!) 105   Temp 98.6 F (37 C) (Oral)   Resp (!) 29   SpO2 95%   Physical Exam Vitals reviewed.  Constitutional:      General: He is not in acute distress.    Appearance: He is obese. He is ill-appearing.  HENT:     Head: Normocephalic.     Mouth/Throat:     Mouth: Mucous membranes are moist.  Eyes:     Pupils: Pupils are equal, round, and reactive to light.  Cardiovascular:     Rate and Rhythm: Normal rate.  Pulmonary:     Effort: Pulmonary effort is normal.     Breath sounds:  Decreased breath sounds present.     Comments: Stridor noted on exam Chest:     Chest wall: No mass, deformity or tenderness.  Abdominal:     Palpations: Abdomen is soft.  Musculoskeletal:        General: Normal range of motion.     Cervical back: Normal range of motion.     Right lower leg: Edema present.     Left lower leg: Edema present.  Skin:    General: Skin is warm and dry.     Capillary Refill: Capillary refill takes  less than 2 seconds.  Neurological:     General: No focal deficit present.     Mental Status: He is alert.  Psychiatric:        Mood and Affect: Mood normal.    ED Results / Procedures / Treatments   Labs (all labs ordered are listed, but only abnormal results are displayed) Labs Reviewed  RESP PANEL BY RT-PCR (FLU A&B, COVID) ARPGX2  CBC WITH DIFFERENTIAL/PLATELET  BASIC METABOLIC PANEL  BRAIN NATRIURETIC PEPTIDE  TROPONIN I (HIGH SENSITIVITY)    EKG EKG Interpretation  Date/Time:  Thursday October 03 2020 14:11:29 EDT Ventricular Rate:  97 PR Interval:  120 QRS Duration: 107 QT Interval:  359 QTC Calculation: 456 R Axis:   26 Text Interpretation: Sinus rhythm Multiform ventricular premature complexes Probable left atrial enlargement RSR' in V1 or V2, probably normal variant Nonspecific T abnrm, anterolateral leads Confirmed by Pattricia Boss [phone]) on 10/03/2020 2:19:30 PM  Radiology DG Chest Port 1 View  Result Date: 10/03/2020 CLINICAL DATA:  Shortness of breath EXAM: PORTABLE CHEST 1 VIEW COMPARISON:  11/06/2019 FINDINGS: Moderate left pleural effusion with left lower lobe atelectasis or infiltrate. Mild cardiomegaly. No confluent opacity on the right. Prior CABG. IMPRESSION: Moderate left pleural effusion and left lower lobe atelectasis or infiltrate. Electronically Signed   By: Rolm Baptise M.D.   On: 10/03/2020 15:48   VAS [place] LOWER EXTREMITY VENOUS (DVT) (ONLY [facility])  Result Date: 10/03/2020  Lower Venous DVT Study Patient Name:  MARSHAL ESKEW  Date of Exam:   10/03/2020 Medical Rec #: [id number]        Accession #:    [id number] Date of Birth: [birth year]       Patient Gender: M Patient Age:   74Y Exam Location:  [facility] Procedure:      VAS [place] LOWER EXTREMITY VENOUS (DVT) Referring Phys: [id number] BRITNI A HENDERLY --------------------------------------------------------------------------------  Indications: Edema.  Limitations: Body habitus, poor ultrasound/tissue  interface and patient position. Comparison Study: 08/01/2019- negative lower extremity venous duplex Performing Technologist: Maudry Mayhew MHA, RDMS, RVT, RDCS  Examination Guidelines: A complete evaluation includes B-mode imaging, spectral Doppler, color Doppler, and power Doppler as needed of all accessible portions of each vessel. Bilateral testing is considered an integral part of a complete examination. Limited examinations for reoccurring indications may be performed as noted. The reflux portion of the exam is performed with the patient in reverse Trendelenburg.  Right Technical Findings: Not visualized segments include CFV.  +---------+---------------+---------+-----------+----------+--------------+ LEFT     CompressibilityPhasicitySpontaneityPropertiesThrombus Aging +---------+---------------+---------+-----------+----------+--------------+ CFV      Full           Yes      Yes                                 +---------+---------------+---------+-----------+----------+--------------+ SFJ      Full                                                        +---------+---------------+---------+-----------+----------+--------------+  FV Prox  Full                                                        +---------+---------------+---------+-----------+----------+--------------+ FV Mid   Full                                                        +---------+---------------+---------+-----------+----------+--------------+ FV DistalFull                                                        +---------+---------------+---------+-----------+----------+--------------+ PFV      Full                                                        +---------+---------------+---------+-----------+----------+--------------+ POP      Full           Yes      Yes                                 +---------+---------------+---------+-----------+----------+--------------+ PTV       Full                                                        +---------+---------------+---------+-----------+----------+--------------+ PERO     Full                                                        +---------+---------------+---------+-----------+----------+--------------+     Summary: LEFT: - There is no evidence of deep vein thrombosis in the lower extremity.  - A cystic structure is found in the popliteal fossa.  *See table(s) above for measurements and observations.    Preliminary     Procedures Procedures   Medications Ordered in ED Medications - No data to display  ED Course  I have reviewed the triage vital signs and the nursing notes.  Pertinent labs & imaging results that were available during my care of the patient were reviewed by me and considered in my medical decision making (see chart for details).    MDM Rules/Calculators/A&P                          75 yo male ho ckd, s/p fistula placement, swelling to lle, negative dvt study, presents with wheezing and dyspnea. Awaiting cbc, cxr. CXR with left effusion and possible infiltrate. Patient treated here with Rocephin and Zithromax. Discussed with hospitalist and  will see for admission Final Clinical Impression(s) / ED Diagnoses Final diagnoses:  Pleural effusion on left    Rx / DC Orders ED Discharge Orders     None        Pattricia Boss, MD 10/03/20 1609

## 2020-10-03 NOTE — ED Notes (Signed)
Pt transported to vascular.  °

## 2020-10-03 NOTE — H&P (Signed)
TRH H&P    Patient Demographics:    Christian [name], is a 75 y.o. male  MRN: [id number]  DOB - Sep 26, 1945  Admit Date - 10/03/2020  Referring MD/NP/PA: Pryor Curia  Outpatient Primary MD for the patient is Hoyt Koch, MD  Patient coming from: Home  Chief complaint-shortness of breath   HPI:    Christian [name]  is a 75 y.o. male, with history of CAD s/p CABG x4, hypertension, hyperlipidemia, CKD stage IV, BPH who recently had AV fistula creation by vascular surgery on 09/18/2020, came to hospital with worsening shortness of breath of 1 day duration.  Patient is followed by nephrology as outpatient, he is on high-dose Lasix.  He has been compliant with his medications as per patient's wife.  He denies coughing up any phlegm.  Recently noted discharge from AV fistula site and was seen by vascular surgery.  He denies nausea, vomiting.  Admits to having small BM every time he urinates.  Denies dysuria. In the ED chest x-ray showed moderate left pleural effusion, BNP elevated to 6 72.  Patient started on ceftriaxone and Zithromax for possible community-acquired pneumonia.  Creatinine stable at 2.85.  Venous duplex of lower extremities was negative for DVT. Denies chest pain     Review of systems:    In addition to the HPI above,    All other systems reviewed and are negative.    Past History of the following :    Past Medical History:  Diagnosis Date   Anemia    low iron   BPH (benign prostatic hypertrophy)    CHF (congestive heart failure) (HCC)    Chronic kidney disease    Coronary artery disease    Dry eyes left   Hiatal hernia    Hx of CABG 08/15/2019: x 4 using bilateral IMAs and left radial artery .  LIMA TO LAD, RIMA TO PDA, RADIAL ARTERY TO CIRC AND SEQUENTIALLY TO OM1. 08/15/2019   Hyperlipidemia    Hypertension    Incomplete bladder emptying    Myocardial infarction ([place]) 08/09/2019    Nocturia    PE (pulmonary thromboembolism) ([place]) 08/09/2019   small RLL PE 08/09/19   Pneumonia    as a child   Pre-diabetes    Problems with swallowing pt states test at [facility] approx 2012 shows a gastric valve  dysfunction--  eats small bites and drink liquids slowly   SOB (shortness of breath) on exertion       Past Surgical History:  Procedure Laterality Date   APPLICATION OF WOUND VAC N/A 08/24/2019   Procedure: APPLICATION OF WOUND VAC;  Surgeon: [name], MD;  Location: [place];  Service: Thoracic;  Laterality: N/A;   APPLICATION OF WOUND VAC  08/29/2019   Procedure: Wound Vac change;  Surgeon: [name], MD;  Location: [place] [facility];  Service: Open Heart Surgery;;   APPLICATION OF WOUND VAC N/A 09/04/2019   Procedure: Application Of Wound Vac;  Surgeon: Grace Stefon, MD;  Location: [place];  Service: Open Heart Surgery;  Laterality: N/A;   APPLICATION OF WOUND VAC N/A 09/06/2019   Procedure: APPLICATION OF ACELL, APPLICATION OF WOUND VAC USING PREVENA INCISIONAL  DRESSING;  Surgeon: Wallace Going, DO;  Location: [place];  Service: Plastics;  Laterality: N/A;   AV FISTULA PLACEMENT Left 09/18/2020   Procedure: ARTERIOVENOUS (AV) FISTULA CREATION LEFT VERSUS GRAFT;  Surgeon: Serafina Mitchell, MD;  Location: [place];  Service: Vascular;  Laterality: Left;   CARDIAC CATHETERIZATION     CORONARY ARTERY BYPASS GRAFT N/A 08/15/2019   Procedure: CORONARY ARTERY BYPASS GRAFTING (CABG), x 4 using bilateral IMAs and left radial artery .  LIMA TO LAD, RIMA TO PDA, RADIAL ARTERY TO CIRC AND SEQUENTIALLY TO OM1.;  Surgeon: [name], MD;  Location: [place];  Service: Open Heart Surgery;  Laterality: N/A;   CORONARY STENT PLACEMENT  02/27/2014   distal rt/pd coronary       dr Einar Gip   CYSTO/ BLADDER BIOPSY'S/ CAUTHERIZATION  01-14-2004  DR Gaynelle Arabian   EXPLORATION POST OPERATIVE OPEN HEART N/A 08/16/2019   Procedure: Chest Closure S?P CABG WITH APPLICATION OF PREVENA  INCISIONAL  WOUND VAC;  Surgeon: [name], MD;  Location: [place];  Service: Open Heart Surgery;  Laterality: N/A;   EXPLORATION POST OPERATIVE OPEN HEART N/A 08/21/2019   Procedure: CHEST WASHOUT S/P OPEN CHEST;  Surgeon: [name], MD;  Location: [place];  Service: Open Heart Surgery;  Laterality: N/A;  Open chest with Esmark dressing with Ioban sealant coverage.   EXPLORATION POST OPERATIVE OPEN HEART N/A 08/18/2019   Procedure: EXPLORATION POST OPERATIVE OPEN HEART (performed 04/23 on unit);  Surgeon: [name], MD;  Location: [place];  Service: Open Heart Surgery;  Laterality: N/A;   EXPLORATION POST OPERATIVE OPEN HEART N/A 08/24/2019   Procedure: CHEST WASHOUT POST OPERATIVE OPEN HEART;  Surgeon: [name], MD;  Location: [place];  Service: Open Heart Surgery;  Laterality: N/A;   EXPLORATION POST OPERATIVE OPEN HEART N/A 08/29/2019   Procedure: CHEST WOUND WASHOUT POST OPERATIVE OPEN HEART;  Surgeon: [name], MD;  Location: [place];  Service: Open Heart Surgery;  Laterality: N/A;   EXPLORATION POST OPERATIVE OPEN HEART N/A 09/04/2019   Procedure: MEDIASTINAL EXPLORATION WITH STERNAL WOUND IRRIGATION;  Surgeon: Grace Yosgar, MD;  Location: [place];  Service: Open Heart Surgery;  Laterality: N/A;   EXPLORATION POST OPERATIVE OPEN HEART N/A 09/14/2019   Procedure: EVACUATION OF HEMATOMA;  Surgeon: Grace Javarri, MD;  Location: [place];  Service: Open Heart Surgery;  Laterality: N/A;   IR FLUORO GUIDE CV LINE LEFT  09/19/2019   IR GASTROSTOMY TUBE MOD SED  10/04/2019   IR GASTROSTOMY TUBE REMOVAL  11/29/2019   IR PATIENT EVAL TECH 0-60 MINS  09/29/2019   IR US GUIDE VASC ACCESS LEFT  09/19/2019   LAPAROSCOPIC LYSIS OF ADHESIONS N/A 09/06/2019   Procedure: LAPAROSCOPIC OMENTAL HARVEST;  Surgeon: Mickeal Skinner, MD;  Location: [place];  Service: General;  Laterality: N/A;   LEFT HEART CATH AND CORONARY ANGIOGRAPHY N/A 08/10/2019   Procedure: LEFT HEART CATH AND CORONARY  ANGIOGRAPHY;  Surgeon: Nigel Mormon, MD;  Location: [place] [facility];  Service: Cardiovascular;  Laterality: N/A;   LEFT HEART CATHETERIZATION WITH CORONARY ANGIOGRAM N/A 02/27/2014   Procedure: LEFT HEART CATHETERIZATION WITH CORONARY ANGIOGRAM;  Surgeon: Laverda Page, MD;  Location: [facility] CATH LAB;  Service: Cardiovascular;  Laterality: N/A;   MEDIASTINAL EXPLORATION N/A 09/06/2019   Procedure: MEDIASTINAL  EXPLORATION;  Surgeon: Grace Adekunle, MD;  Location: [place];  Service: Thoracic;  Laterality: N/A;   PECTORALIS FLAP  09/06/2019   Procedure: Pectoralis ADVANCEMENT Flap;  Surgeon: Wallace Going, DO;  Location: [place];  Service: Plastics;;   PERCUTANEOUS CORONARY STENT INTERVENTION (PCI-S)  02/27/2014   Procedure: PERCUTANEOUS CORONARY STENT INTERVENTION (PCI-S);  Surgeon: Laverda Page, MD;  Location: [facility] CATH LAB;  Service: Cardiovascular;;  rt PDA  3.0/28mm Promus stent   RADIAL ARTERY HARVEST Left 08/15/2019   Procedure: Radial Artery Harvest;  Surgeon: [name], MD;  Location: [place];  Service: Open Heart Surgery;  Laterality: Left;   RIB PLATING N/A 09/06/2019   Procedure: STERNAL PLATING;  Surgeon: Grace Helio, MD;  Location: [place];  Service: Thoracic;  Laterality: N/A;   TEE WITHOUT CARDIOVERSION N/A 08/15/2019   Procedure: TRANSESOPHAGEAL ECHOCARDIOGRAM (TEE);  Surgeon: [name], MD;  Location: [place];  Service: Open Heart Surgery;  Laterality: N/A;   TRACHEOSTOMY TUBE PLACEMENT  08/29/2019   Procedure: Tracheostomy;  Surgeon: [name], MD;  [facility];  Service: Open Heart Surgery; Decannulated 6/10/20211   TRANSURETHRAL RESECTION OF PROSTATE  04/04/2012   Procedure: TRANSURETHRAL RESECTION OF THE PROSTATE WITH GYRUS INSTRUMENTS;  Surgeon: Ailene Rud, MD;  Location: [facility];  Service: Urology;  Laterality: N/A;   TRANSURETHRAL RESECTION OF PROSTATE N/A 09/27/2014   Procedure: TRANSURETHRAL RESECTION OF  THE PROSTATE ;  Surgeon: Carolan Clines, MD;  Location[facility];  Service: Urology;  Laterality: N/A;   UPPER GASTROINTESTINAL ENDOSCOPY        Social History:      Social History   Tobacco Use   Smoking status: Former    Packs/day: 0.50    Years: 20.00    Pack years: 10.00    Types: Cigarettes    Quit date: 1975    Years since quitting: 47.4   Smokeless tobacco: Never  Substance Use Topics   Alcohol use: Not Currently    Alcohol/week: 0.0 standard drinks    Comment: 1 beer week rarely       Family History :     Family History  Problem Relation Age of Onset   Diabetes Mother    Hypertension Mother    Diabetes Father    Diabetes Brother    Hypertension Brother    Bone cancer Brother    Diabetes Brother       Home Medications:   Prior to Admission medications   Medication Sig Start Date End Date Taking? Authorizing Provider  aspirin EC 81 MG tablet Take 81 mg by mouth daily.    [provider]  atorvastatin (LIPITOR) 20 MG tablet Take 1 tablet (20 mg total) by mouth every evening. Patient taking differently: Take 20 mg by mouth at bedtime. 07/18/20   Adrian Prows, MD  clonazePAM (KLONOPIN) 0.5 MG disintegrating tablet TAKE 1 TABLET BY MOUTH TWICE A DAY Patient taking differently: Take 0.5 mg by mouth 2 (two) times daily. 04/01/20   Hoyt Koch, MD  clopidogrel (PLAVIX) 75 MG tablet Take 1 tablet by mouth daily. 09/09/20   [provider]  cromolyn (OPTICROM) 4 % ophthalmic solution Place 1 drop into both eyes every Wednesday. 12/08/19   [provider]  [employer] ACETAMINOPHEN 325 MG tablet TAKE 2 CAPS BY MOUTH EVERY 6 HOURS AS NEEDED FOR PAIN FOR TEMP 100F OR ABOVE Patient taking differently: Take 325 mg by mouth every 6 (six) hours  as needed for moderate pain or mild pain. 02/23/20   Hoyt Koch, MD  [employer] SENNA PLUS 8.6-50 MG tablet TAKE 2 TABLETS BY MOUTH AT BEDTIME FOR CONSTIPATION Patient taking differently: Take 2  tablets by mouth at bedtime. 06/17/20   Hoyt Koch, MD  ferrous sulfate 325 (65 FE) MG tablet Take 325 mg by mouth daily with breakfast.    [provider]  furosemide (LASIX) 80 MG tablet Take 80 mg by mouth 2 (two) times daily with breakfast and lunch. Morning and miday    [provider]  hydrALAZINE (APRESOLINE) 25 MG tablet Take 1 tablet (25 mg total) by mouth 3 (three) times daily. 05/02/20 04/27/21  Adrian Prows, MD  isosorbide dinitrate (ISORDIL) 30 MG tablet TAKE 1 TABLET BY MOUTH 3 TIMES DAILY. Patient taking differently: Take 30 mg by mouth 2 (two) times daily. 06/03/20   Adrian Prows, MD  metoprolol succinate (TOPROL-XL) 200 MG 24 hr tablet Take 1 tablet (200 mg total) by mouth daily. Take with or immediately following a meal. 07/11/20   Adrian Prows, MD  multivitamin (RENA-VIT) TABS tablet Take 1 tablet by mouth at bedtime. 10/16/19   Barrett, Erin R, PA-C  nitroGLYCERIN (NITROSTAT) 0.4 MG SL tablet Place 0.4 mg under the tongue every 5 (five) minutes as needed for chest pain. 10/25/19   [provider]  oxycodone (OXY-IR) 5 MG capsule Take 5 mg by mouth every 4 (four) hours as needed for pain.    [provider]  oxyCODONE-acetaminophen (PERCOCET) 5-325 MG tablet Take 1 tablet by mouth every 4 (four) hours as needed for severe pain. 09/18/20 09/18/21  Serafina Mitchell, MD  PREVIDENT 5000 BOOSTER PLUS 1.1 % PSTE Take 1 application by mouth daily.  06/14/18   [provider]  sertraline (ZOLOFT) 25 MG tablet TAKE 1 TABLET ONE TIME A DAY Patient taking differently: Take 25 mg by mouth daily. 07/16/20   Hoyt Koch, MD  silodosin (RAPAFLO) 8 MG CAPS capsule TAKE 1 CAPSULE BY MOUTH EVERYDAY AT BEDTIME 09/24/20   McKenzie, Candee Furbish, MD  Vitamin D, Ergocalciferol, (DRISDOL) 1.25 MG (50000 UNIT) CAPS capsule Take 1 capsule (50,000 Units total) by mouth every 7 (seven) days. 06/28/20   Hoyt Koch, MD     Allergies:    No Known  Allergies   Physical Exam:   Vitals  Blood pressure (!) 152/89, pulse 95, temperature 98.6 F (37 C), temperature source Oral, resp. rate (!) 29, SpO2 94 %.  1.  General: Appears in no acute distress  2. Psychiatric: Alert, oriented x3, intact insight and judgment  3. Neurologic: Cranial nerves II through XII grossly intact, motor strength 5/5 in all extremities, no focal deficit noted  4. HEENMT:  Atraumatic normocephalic, extraocular's are intact  5. Respiratory : Bilateral rhonchi auscultated  6. Cardiovascular : S1-S2, regular, no murmur auscultated  7. Gastrointestinal:  Abdomen is soft, nontender to palpation, no organomegaly  8. Skin:  No rashes noted  9.Musculoskeletal:  Bilateral 1+ pitting edema of the lower extremities    Data Review:    CBC Recent Labs  Lab 10/03/20 1500  WBC 9.6  HGB 11.8*  HCT 38.1*  PLT 191  MCV 107.0*  MCH 33.1  MCHC 31.0  RDW 14.4  LYMPHSABS 0.6*  MONOABS 0.9  EOSABS 0.0  BASOSABS 0.1   ------------------------------------------------------------------------------------------------------------------  Results for orders placed or performed during the hospital encounter of 10/03/20 (from the past 48 hour(s))  Basic metabolic panel  Status: Abnormal   Collection Time: 10/03/20  1:14 PM  Result Value Ref Range   Sodium 143 135 - 145 mmol/L   Potassium 3.7 3.5 - 5.1 mmol/L   Chloride 110 98 - 111 mmol/L   CO2 21 (L) 22 - 32 mmol/L   Glucose, Bld 116 (H) 70 - 99 mg/dL    Comment: Glucose reference range applies only to samples taken after fasting for at least 8 hours.   BUN 32 (H) 8 - 23 mg/dL   Creatinine, Ser 2.85 (H) 0.61 - 1.24 mg/dL   Calcium 9.1 8.9 - 10.3 mg/dL   GFR, Estimated 23 (L) >60 mL/min    Comment: (NOTE) Calculated using the CKD-EPI Creatinine Equation (2021)    Anion gap 12 5 - 15    Comment: Performed at [facility] [address] [place], [place] [address]  Troponin I (High  Sensitivity)     Status: Abnormal   Collection Time: 10/03/20  1:14 PM  Result Value Ref Range   Troponin I (High Sensitivity) 49 (H) <18 ng/L    Comment: (NOTE) Elevated high sensitivity troponin I (hsTnI) values and significant  changes across serial measurements may suggest ACS but many other  chronic and acute conditions are known to elevate hsTnI results.  Refer to the "Links" section for chest pain algorithms and additional  guidance. Performed at [facility], [place] [address], [place], [place] [address]   Resp Panel by RT-PCR (Flu A&B, Covid) Nasopharyngeal Swab     Status: None   Collection Time: 10/03/20  1:55 PM   Specimen: Nasopharyngeal Swab; Nasopharyngeal(NP) swabs in vial transport medium  Result Value Ref Range   SARS Coronavirus 2 by RT PCR NEGATIVE NEGATIVE    Comment: (NOTE) SARS-CoV-2 target nucleic acids are NOT DETECTED.  The SARS-CoV-2 RNA is generally detectable in upper respiratory specimens during the acute phase of infection. The lowest concentration of SARS-CoV-2 viral copies this assay can detect is 138 copies/mL. A negative result does not preclude SARS-Cov-2 infection and should not be used as the sole basis for treatment or other patient management decisions. A negative result may occur with  improper specimen collection/handling, submission of specimen other than nasopharyngeal swab, presence of viral mutation(s) within the areas targeted by this assay, and inadequate number of viral copies(<138 copies/mL). A negative result must be combined with clinical observations, patient history, and epidemiological information. The expected result is Negative.  Fact Sheet for Patients:  EntrepreneurPulse.com.au  Fact Sheet for Healthcare Providers:  IncredibleEmployment.be  This test is no t yet approved or cleared by the [place] FDA and  has been authorized for detection and/or diagnosis of SARS-CoV-2  by FDA under an Emergency Use Authorization (EUA). This EUA will remain  in effect (meaning this test can be used) for the duration of the COVID-19 declaration under Section 564(b)(1) of the Act, 21 U.S.C.section 360bbb-3(b)(1), unless the authorization is terminated  or revoked sooner.       Influenza A by PCR NEGATIVE NEGATIVE   Influenza B by PCR NEGATIVE NEGATIVE    Comment: (NOTE) The Xpert Xpress SARS-CoV-2/FLU/RSV plus assay is intended as an aid in the diagnosis of influenza from Nasopharyngeal swab specimens and should not be used as a sole basis for treatment. Nasal washings and aspirates are unacceptable for Xpert Xpress SARS-CoV-2/FLU/RSV testing.  Fact Sheet for Patients: EntrepreneurPulse.com.au  Fact Sheet for Healthcare Providers: IncredibleEmployment.be  This test is not yet approved or cleared by the [place]  States FDA and has been authorized for detection and/or diagnosis of SARS-CoV-2 by FDA under an Emergency Use Authorization (EUA). This EUA will remain in effect (meaning this test can be used) for the duration of the COVID-19 declaration under Section 564(b)(1) of the Act, 21 U.S.C. section 360bbb-3(b)(1), unless the authorization is terminated or revoked.  Performed at [facility], [place] [address], [place], [place] [address]   Troponin I (High Sensitivity)     Status: Abnormal   Collection Time: 10/03/20  3:00 PM  Result Value Ref Range   Troponin I (High Sensitivity) 76 (H) <18 ng/L    Comment: RESULT CALLED TO, READ BACK BY AND VERIFIED WITH:  Lester [place], RN, [phone], 10/03/20, [place] (NOTE) Elevated high sensitivity troponin I (hsTnI) values and significant  changes across serial measurements may suggest ACS but many other  chronic and acute conditions are known to elevate hsTnI results.  Refer to the Links section for chest pain algorithms and additional  guidance. Performed at [facility], [place] [address] [place], [place] [address]     Chemistries  Recent Labs  Lab 10/03/20 1314  NA 143  K 3.7  CL 110  CO2 21*  GLUCOSE 116*  BUN 32*  CREATININE 2.85*  CALCIUM 9.1   ------------------------------------------------------------------------------------------------------------------  ------------------------------------------------------------------------------------------------------------------ GFR: Estimated Creatinine Clearance: 25.8 mL/min (A) (by C-G formula based on SCr of 2.85 mg/dL (H)). Liver Function Tests: No results for input(s): AST, ALT, ALKPHOS, BILITOT, PROT, ALBUMIN in the last 168 hours. No results for input(s): LIPASE, AMYLASE in the last 168 hours. No results for input(s): AMMONIA in the last 168 hours. Coagulation Profile: No results for input(s): INR, PROTIME in the last 168 hours. Cardiac Enzymes: No results for input(s): CKTOTAL, CKMB, CKMBINDEX, TROPONINI in the last 168 hours. BNP (last 3 results) Recent Labs    05/28/20 1538  PROBNP 64.0   HbA1C: No results for input(s): HGBA1C in the last 72 hours. CBG: No results for input(s): GLUCAP in the last 168 hours. Lipid Profile: No results for input(s): CHOL, HDL, LDLCALC, TRIG, CHOLHDL, LDLDIRECT in the last 72 hours. Thyroid Function Tests: No results for input(s): TSH, T4TOTAL, FREET4, T3FREE, THYROIDAB in the last 72 hours. Anemia Panel: No results for input(s): VITAMINB12, FOLATE, FERRITIN, TIBC, IRON, RETICCTPCT in the last 72 hours.  --------------------------------------------------------------------------------------------------------------- Urine analysis:    Component Value Date/Time   COLORURINE YELLOW 08/14/2019 2140   APPEARANCEUR HAZY (A) 08/14/2019 2140   LABSPEC 1.006 08/14/2019 2140   PHURINE 6.0 08/14/2019 2140   GLUCOSEU 150 (A) 08/14/2019 2140   HGBUR MODERATE (A) 08/14/2019 2140   BILIRUBINUR NEGATIVE 08/14/2019 2140   [place] NEGATIVE 08/14/2019 2140    PROTEINUR NEGATIVE 08/14/2019 2140   NITRITE NEGATIVE 08/14/2019 2140   LEUKOCYTESUR LARGE (A) 08/14/2019 2140      Imaging Results:    DG Chest Port 1 View  Result Date: 10/03/2020 CLINICAL DATA:  Shortness of breath EXAM: PORTABLE CHEST 1 VIEW COMPARISON:  11/06/2019 FINDINGS: Moderate left pleural effusion with left lower lobe atelectasis or infiltrate. Mild cardiomegaly. No confluent opacity on the right. Prior CABG. IMPRESSION: Moderate left pleural effusion and left lower lobe atelectasis or infiltrate. Electronically Signed   By: Rolm Baptise M.D.   On: 10/03/2020 15:48   VAS [place] LOWER EXTREMITY VENOUS (DVT) (ONLY [facility])  Result Date: 10/03/2020  Lower Venous DVT Study Patient Name:  KIMON LOEWEN  Date of Exam:   10/03/2020 Medical Rec #: [id number]  Accession #:    [id number] Date of Birth: Feb 26, 1946       Patient Gender: M Patient Age:   74Y Exam Location:  [facility] Procedure:      VAS [place] LOWER EXTREMITY VENOUS (DVT) Referring Phys: [id number] BRITNI A HENDERLY --------------------------------------------------------------------------------  Indications: Edema.  Limitations: Body habitus, poor ultrasound/tissue interface and patient position. Comparison Study: 08/01/2019- negative lower extremity venous duplex Performing Technologist: Maudry Mayhew MHA, RDMS, RVT, RDCS  Examination Guidelines: A complete evaluation includes B-mode imaging, spectral Doppler, color Doppler, and power Doppler as needed of all accessible portions of each vessel. Bilateral testing is considered an integral part of a complete examination. Limited examinations for reoccurring indications may be performed as noted. The reflux portion of the exam is performed with the patient in reverse Trendelenburg.  Right Technical Findings: Not visualized segments include CFV.  +---------+---------------+---------+-----------+----------+--------------+ LEFT      CompressibilityPhasicitySpontaneityPropertiesThrombus Aging +---------+---------------+---------+-----------+----------+--------------+ CFV      Full           Yes      Yes                                 +---------+---------------+---------+-----------+----------+--------------+ SFJ      Full                                                        +---------+---------------+---------+-----------+----------+--------------+ FV Prox  Full                                                        +---------+---------------+---------+-----------+----------+--------------+ FV Mid   Full                                                        +---------+---------------+---------+-----------+----------+--------------+ FV DistalFull                                                        +---------+---------------+---------+-----------+----------+--------------+ PFV      Full                                                        +---------+---------------+---------+-----------+----------+--------------+ POP      Full           Yes      Yes                                 +---------+---------------+---------+-----------+----------+--------------+ PTV      Full                                                        +---------+---------------+---------+-----------+----------+--------------+  PERO     Full                                                        +---------+---------------+---------+-----------+----------+--------------+     Summary: LEFT: - There is no evidence of deep vein thrombosis in the lower extremity.  - A cystic structure is found in the popliteal fossa.  *See table(s) above for measurements and observations.    Preliminary     My personal review of EKG: Rhythm NSR, no ST-T changes   Assessment & Plan:    Active Problems:   Pulmonary edema Community-acquired pneumonia CKD stage IV   Acute pulmonary edema-patient presenting with  acute on chronic combined systolic and diastolic heart failure.  He is on high-dose Lasix at home.  I called and discussed with nephrology, they will see patient in consultation.  We will give 1 dose of Lasix 80 mg IV x1.  Will monitor on telemetry.  Left pleural effusion/community-acquired pneumonia-patient has left-sided pleural effusion which is likely from underlying CHF but as it is unilateral, there is concern for pneumonia.  Will obtain procalcitonin.  Patient has been started on ceftriaxone and Zithromax.  We will continue with antibiotics.  Will obtain blood cultures x2.  Urine for strep pneumo antigen.  Patient is not requiring oxygen, will assess the response with IV Lasix.  If no improvement in pleural effusion, consider thoracentesis in a.m.  We will repeat chest x-ray in a.m.  History of CAD s/p CABG-continue aspirin, Plavix was recently stopped by patient's cardiologist Dr. Einar Gip.  Continue atorvastatin.  Hypertension-continue metoprolol 200 mg daily, isosorbide 30 mg 3 times daily.  Will hold hydralazine for now, as patient is on high-dose Lasix.  Consider adding hydralazine if blood pressure remains elevated with above regimen.  CKD stage IV-started on high-dose Lasix as above.  Nephrology consulted.   DVT Prophylaxis-Heparin  AM Labs Ordered, also please review Full Orders  Family Communication: Admission, patients condition and plan of care including tests being ordered have been discussed with the patient and his wife at bedside who indicate understanding and agree with the plan and Code Status.  Code Status: Full code  Admission status: Observation/Inpatient :The appropriate admission status for this patient is INPATIENT. Inpatient status is judged to be reasonable and necessary in order to provide the required intensity of service to ensure the patient's safety. The patient's presenting symptoms, physical exam findings, and initial radiographic and laboratory data in the  context of their chronic comorbidities is felt to place them at high risk for further clinical deterioration. Furthermore, it is not anticipated that the patient will be medically stable for discharge from the hospital within 2 midnights of admission. The following factors support the admission status of inpatient.     The patient's presenting symptoms include dyspnea. The worrisome physical exam findings include bilateral rhonchi. The initial radiographic and laboratory data are worrisome because of left-sided pleural effusion. The chronic co-morbidities include CKD stage IV.       * I certify that at the point of admission it is my clinical judgment that the patient will require inpatient hospital care spanning beyond 2 midnights from the point of admission due to high intensity of service, high risk for further deterioration and high frequency of surveillance required.*  Time spent in minutes : 60  minutes   Oswald Hillock M.D

## 2020-10-03 NOTE — ED Notes (Signed)
Lab called with current troponin of 76. Alana,RN notified of results.

## 2020-10-04 ENCOUNTER — Inpatient Hospital Stay (HOSPITAL_COMMUNITY): Payer: Medicare Other

## 2020-10-04 LAB — FOLATE: Folate: 51.3 ng/mL (ref >5.9)

## 2020-10-04 LAB — COMPREHENSIVE METABOLIC PANEL
ALT: 52 U/L — ABNORMAL HIGH (ref 0–44)
AST: 32 U/L (ref 15–41)
Albumin: 3.8 g/dL (ref 3.5–5.0)
Alkaline Phosphatase: 80 U/L (ref 38–126)
Anion gap: 13 (ref 5–15)
BUN: 32 mg/dL — ABNORMAL HIGH (ref 8–23)
CO2: 25 mmol/L (ref 22–32)
Calcium: 9.3 mg/dL (ref 8.9–10.3)
Chloride: 105 mmol/L (ref 98–111)
Creatinine, Ser: 2.82 mg/dL — ABNORMAL HIGH (ref 0.61–1.24)
GFR, Estimated: 23 mL/min — ABNORMAL LOW (ref >60)
Glucose, Bld: 79 mg/dL (ref 70–99)
Potassium: 4.1 mmol/L (ref 3.5–5.1)
Sodium: 143 mmol/L (ref 135–145)
Total Bilirubin: 0.5 mg/dL (ref 0.3–1.2)
Total Protein: 7 g/dL (ref 6.5–8.1)

## 2020-10-04 LAB — CBC
HCT: 36.8 % — ABNORMAL LOW (ref 39.0–52.0)
Hemoglobin: 11.5 g/dL — ABNORMAL LOW (ref 13.0–17.0)
MCH: 33.5 pg (ref 26.0–34.0)
MCHC: 31.3 g/dL (ref 30.0–36.0)
MCV: 107.3 fL — ABNORMAL HIGH (ref 80.0–100.0)
Platelets: 172 10*3/uL (ref 150–400)
RBC: 3.43 MIL/uL — ABNORMAL LOW (ref 4.22–5.81)
RDW: 14.5 % (ref 11.5–15.5)
WBC: 9.2 10*3/uL (ref 4.0–10.5)
nRBC: 0 % (ref 0.0–0.2)

## 2020-10-04 LAB — MAGNESIUM: Magnesium: 2.1 mg/dL (ref 1.7–2.4)

## 2020-10-04 LAB — IRON AND TIBC
Iron: 19 ug/dL — ABNORMAL LOW (ref 45–182)
Saturation Ratios: 8 % — ABNORMAL LOW (ref 17.9–39.5)
TIBC: 235 ug/dL — ABNORMAL LOW (ref 250–450)
UIBC: 216 ug/dL

## 2020-10-04 LAB — VITAMIN B12: Vitamin B-12: 282 pg/mL (ref 180–914)

## 2020-10-04 LAB — FERRITIN: Ferritin: 273 ng/mL (ref 24–336)

## 2020-10-04 MED ORDER — HYDRALAZINE HCL 20 MG/ML IJ SOLN
5.0000 mg | INTRAMUSCULAR | Status: DC | PRN
Start: 2020-10-04 — End: 2020-10-07
  Administered 2020-10-04: 5 mg via INTRAVENOUS
  Filled 2020-10-04: qty 1

## 2020-10-04 MED ORDER — FUROSEMIDE 80 MG PO TABS
80.0000 mg | ORAL_TABLET | Freq: Two times a day (BID) | ORAL | Status: DC
  Administered 2020-10-04 – 2020-10-07 (×6): 80 mg via ORAL
  Filled 2020-10-04 (×6): qty 1

## 2020-10-04 MED ORDER — NITROGLYCERIN 0.4 MG SL SUBL: 0.4000 mg | SUBLINGUAL_TABLET | SUBLINGUAL | Status: DC | PRN

## 2020-10-04 MED ORDER — ALBUTEROL SULFATE (2.5 MG/3ML) 0.083% IN NEBU
5.0000 mg | INHALATION_SOLUTION | RESPIRATORY_TRACT | Status: DC
  Administered 2020-10-04 (×2): 5 mg via RESPIRATORY_TRACT
  Filled 2020-10-04 (×2): qty 6

## 2020-10-04 MED ORDER — FERROUS SULFATE 325 (65 FE) MG PO TABS
325.0000 mg | ORAL_TABLET | Freq: Every day | ORAL | Status: DC
  Administered 2020-10-05 – 2020-10-07 (×3): 325 mg via ORAL
  Filled 2020-10-04 (×3): qty 1

## 2020-10-04 NOTE — Progress Notes (Signed)
Heart Failure Navigator Progress Note  Assessed for Heart & Vascular TOC clinic readiness.  Unfortunately at this time the patient does not meet criteria due to advanced CKD (dialysis dependent AKI -off HD since 01/2020).   Navigator available for reassessment of patient.   Kerby Nora, PharmD, BCPS Heart Failure Stewardship Pharmacist Phone [phone]

## 2020-10-04 NOTE — Progress Notes (Signed)
Patient ID: Christian [name], male   DOB: 1945-11-15, 75 y.o.   MRN: [id number] S: No new complaints or events overnight. O:BP 130/73 (BP Location: Right Arm)   Pulse 95   Temp 99.2 F (37.3 C) (Oral)   Resp 20   Wt 93.1 kg   SpO2 100%   BMI 32.15 kg/m   Intake/Output Summary (Last 24 hours) at 10/04/2020 1309 Last data filed at 10/04/2020 1200 Gross per 24 hour  Intake 790 ml  Output --  Net 790 ml   Intake/Output: I/O last 3 completed shifts: In: 550 [P.O.:200; IV Piggyback:350] Out: -   Intake/Output this shift:  Total I/O In: 240 [P.O.:240] Out: -  Weight change:  Gen: NAD CVS: RRR Resp: bilateral expiratory wheezing Abd:+BS, soft, NT/ND UVO:ZDGUY pretibial edema, LUE AVF +T/B  Recent Labs  Lab 10/03/20 1314 10/04/20 0223  NA 143 143  K 3.7 4.1  CL 110 105  CO2 21* 25  GLUCOSE 116* 79  BUN 32* 32*  CREATININE 2.85* 2.82*  ALBUMIN  --  3.8  CALCIUM 9.1 9.3  AST  --  32  ALT  --  52*   Liver Function Tests: Recent Labs  Lab 10/04/20 0223  AST 32  ALT 52*  ALKPHOS 80  BILITOT 0.5  PROT 7.0  ALBUMIN 3.8   No results for input(s): LIPASE, AMYLASE in the last 168 hours. No results for input(s): AMMONIA in the last 168 hours. CBC: Recent Labs  Lab 10/03/20 1500 10/04/20 0223  WBC 9.6 9.2  NEUTROABS 7.9*  --   HGB 11.8* 11.5*  HCT 38.1* 36.8*  MCV 107.0* 107.3*  PLT 191 172   Cardiac Enzymes: No results for input(s): CKTOTAL, CKMB, CKMBINDEX, TROPONINI in the last 168 hours. CBG: No results for input(s): GLUCAP in the last 168 hours.  Iron Studies:  Recent Labs    10/04/20 0824  IRON 19*  TIBC 235*  FERRITIN 273   Studies/Results: DG Chest Port 1 View  Result Date: 10/04/2020 CLINICAL DATA:  Cough and wheezing.  Shortness of breath. EXAM: PORTABLE CHEST 1 VIEW COMPARISON:  October 03, 2020 FINDINGS: There is airspace opacity in the left mid lung region as well as in each lower lung region with left pleural effusion. There is cardiomegaly  with pulmonary vascularity normal. Status post coronary artery bypass grafting. There is aortic atherosclerosis. No bone lesions. IMPRESSION: Airspace opacity left mid lung and both lower lung regions with left pleural effusion, essentially stable. Suspect multifocal pneumonia. A degree of superimposed pulmonary edema is possible. With respect to potential pneumonia, atypical organism pneumonia could present in this manner. Stable cardiac silhouette. Status post coronary artery bypass grafting. Electronically Signed   By: Lowella Grip III M.D.   On: 10/04/2020 07:56   DG Chest Port 1 View  Result Date: 10/03/2020 CLINICAL DATA:  Shortness of breath EXAM: PORTABLE CHEST 1 VIEW COMPARISON:  11/06/2019 FINDINGS: Moderate left pleural effusion with left lower lobe atelectasis or infiltrate. Mild cardiomegaly. No confluent opacity on the right. Prior CABG. IMPRESSION: Moderate left pleural effusion and left lower lobe atelectasis or infiltrate. Electronically Signed   By: Rolm Baptise M.D.   On: 10/03/2020 15:48   VAS [place] LOWER EXTREMITY VENOUS (DVT) (ONLY [facility] & [facility])  Result Date: 10/03/2020  Lower Venous DVT Study Patient Name:  Christian [name]  Date of Exam:   10/03/2020 Medical Rec #: [id number]        Accession #:    [id number] Date of  Birth: 1945-06-15       Patient Gender: M Patient Age:   74Y Exam Location:  [facility] Procedure:      VAS [place] LOWER EXTREMITY VENOUS (DVT) Referring Phys: [id number] BRITNI A HENDERLY --------------------------------------------------------------------------------  Indications: Edema.  Limitations: Body habitus, poor ultrasound/tissue interface and patient position. Comparison Study: 08/01/2019- negative lower extremity venous duplex Performing Technologist: Maudry Mayhew MHA, RDMS, RVT, RDCS  Examination Guidelines: A complete evaluation includes B-mode imaging, spectral Doppler, color Doppler, and power Doppler as needed of all accessible portions of each vessel.  Bilateral testing is considered an integral part of a complete examination. Limited examinations for reoccurring indications may be performed as noted. The reflux portion of the exam is performed with the patient in reverse Trendelenburg.  Right Technical Findings: Not visualized segments include CFV.  +---------+---------------+---------+-----------+----------+--------------+ LEFT     CompressibilityPhasicitySpontaneityPropertiesThrombus Aging +---------+---------------+---------+-----------+----------+--------------+ CFV      Full           Yes      Yes                                 +---------+---------------+---------+-----------+----------+--------------+ SFJ      Full                                                        +---------+---------------+---------+-----------+----------+--------------+ FV Prox  Full                                                        +---------+---------------+---------+-----------+----------+--------------+ FV Mid   Full                                                        +---------+---------------+---------+-----------+----------+--------------+ FV DistalFull                                                        +---------+---------------+---------+-----------+----------+--------------+ PFV      Full                                                        +---------+---------------+---------+-----------+----------+--------------+ POP      Full           Yes      Yes                                 +---------+---------------+---------+-----------+----------+--------------+ PTV      Full                                                        +---------+---------------+---------+-----------+----------+--------------+  PERO     Full                                                        +---------+---------------+---------+-----------+----------+--------------+     Summary: LEFT: - There is no evidence of deep  vein thrombosis in the lower extremity.  - A cystic structure is found in the popliteal fossa.  *See table(s) above for measurements and observations. Electronically signed by Monica Martinez MD on 10/03/2020 at 7:41:44 PM.    Final     albuterol  5 mg Nebulization Q4H   aspirin EC  81 mg Oral Daily   atorvastatin  20 mg Oral QPM   clonazePAM  0.5 mg Oral BID   [START ON 10/05/2020] ferrous sulfate  325 mg Oral Q breakfast   furosemide  80 mg Oral BID WC   heparin injection (subcutaneous)  5,000 Units Subcutaneous Q8H   isosorbide dinitrate  30 mg Oral BID   metoprolol  200 mg Oral Daily   sertraline  25 mg Oral Daily   sodium chloride flush  3 mL Intravenous Q12H    BMET    Component Value Date/Time   NA 143 10/04/2020 0223   NA 141 06/19/2019 1130   K 4.1 10/04/2020 0223   CL 105 10/04/2020 0223   CO2 25 10/04/2020 0223   GLUCOSE 79 10/04/2020 0223   BUN 32 (H) 10/04/2020 0223   BUN 21 06/19/2019 1130   CREATININE 2.82 (H) 10/04/2020 0223   CREATININE 2.87 (H) 12/20/2019 1312   CALCIUM 9.3 10/04/2020 0223   GFRNONAA 23 (L) 10/04/2020 0223   GFRAA 19 (L) 10/18/2019 0046   CBC    Component Value Date/Time   WBC 9.2 10/04/2020 0223   RBC 3.43 (L) 10/04/2020 0223   HGB 11.5 (L) 10/04/2020 0223   HCT 36.8 (L) 10/04/2020 0223   PLT 172 10/04/2020 0223   MCV 107.3 (H) 10/04/2020 0223   MCH 33.5 10/04/2020 0223   MCHC 31.3 10/04/2020 0223   RDW 14.5 10/04/2020 0223   LYMPHSABS 0.6 (L) 10/03/2020 1500   MONOABS 0.9 10/03/2020 1500   EOSABS 0.0 10/03/2020 1500   BASOSABS 0.1 10/03/2020 1500     Assessment/Plan:  CKD stage IV - baseline Scr 2.5-3.1.  at baseline without change following IV lasix.  No indication for dialysis.  Will sign off.  Please call with questions or concerns.  Follow with Dr. Royce Macadamia after discharge.  Avoid IV contrast, NSAIDs, and renal dose meds.  Acute hypoxic respiratory distress - in setting of CAP and possibly superimposed pulmonary edema.  Pt  with significant wheezing and agree with addition of albuterol.  Respiratory panel negative.  Continue with azithromycin and rocephin.  Agree with resuming home dose of lasix 80 mg po bid.  No indication for dialysis to help with respiratory issues as it appears to be infectious. 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  Donetta Potts, MD [employer] [phone]

## 2020-10-04 NOTE — Progress Notes (Signed)
PROGRESS NOTE    Christian [name]  ZHG:[id number] DOB: June 25, 1945 DOA: 10/03/2020 PCP: Hoyt Koch, MD   Brief Narrative:  Christian [name]  is a 75 y.o. male, with history of CAD s/p CABG x4, hypertension, hyperlipidemia, CKD stage IV, BPH who recently had AV fistula creation by vascular surgery on 09/18/2020, came to hospital with worsening shortness of breath of 1 day.  ED course: On arrival: Patient tachypneic, tachycardic, blood pressure elevated, oxygen saturation dropped was placed on 2 L of oxygen via nasal cannula, afebrile, chest x-ray showed moderate left pleural effusion, BNP elevated to 672.  Patient started on ceftriaxone and Zithromax for possible community-acquired pneumonia, Lasix 80 mg IV once given.  Creatinine stable at 2.85.  Venous duplex of lower extremities was negative for DVT.  Consulted.  Patient admitted for further evaluation and management for possible fluid overload and CAP.  Assessment & Plan:   Acute hypoxemic respiratory failure in the setting of CAP & superimposed pulmonary edema/left-sided pleural effusion: -He was given Lasix 80 IV once on admission.  COVID-19, flu a and B: Negative. -Reviewed chest x-ray.  BNP elevated at 672 -Last echo 07/22/2020 which shows ejection fraction of 67%.,  Normal diastolic filling pattern.   -Continue azithromycin and Rocephin -Added albuterol every 4 hours for shortness of breath and wheezing -We will resume home dose of Lasix 80 mg p.o. twice daily -On continuous pulse ox-we will try to wean off of oxygen as tolerated -Monitor renal function closely -Strict I&O's and daily weight.  Monitor electrolytes closely.  CKD stage IV/metabolic acidosis: At baseline -Has aVF-(09/18/2020) -Nephrology on board-appreciate help.  Recommend no indication for renal replacement therapy at this time. -Monitor daily I&O's.  Monitor renal function closely.  Avoid nephrotoxic medications.  History of coronary artery disease status  post CABG:  -Elevated troponin: Likely demand ischemia  -patient denies ACS symptoms. -Continue aspirin, atorvastatin. -Followed by cardiology outpatient  Hypertension: Uncontrolled -Continue metoprolol, Isodril, hydralazine as needed.  Monitor blood pressure closely  Macrocytic anemia: -H&H is stable. -Check B12, folate and iron panel -Continue ferrous sulfate -Monitor H&H closely.  Hyperlipidemia: Continue statin  GERD: Continue PPI  Depression: Continue Zoloft and Klonopin  DVT prophylaxis: Heparin Code Status: Full code Family Communication: None present at bedside.  Plan of care discussed with patient in length and he verbalized understanding and agreed with it. Disposition Plan: To be determined 9  Consultants:  Nephrology  Procedures:  None  Antimicrobials:  Rocephin Azithromycin  Status is: Inpatient   Dispo: The patient is from: Home              Anticipated d/c is to: Home              Patient currently is not medically stable to d/c.   Difficult to place patient No     Subjective: Patient seen and examined.  Eating breakfast.  Reports that he is not feeling good this morning and he continues to have shortness of breath, cough.  Reports improvement in leg swelling.  He denies chest pain, fever, chills, headache.  Objective: Vitals:   10/04/20 0051 10/04/20 0355 10/04/20 0525 10/04/20 0744  BP: 136/67 (!) 171/94 (!) 169/85 (!) 155/81  Pulse:  90 97 100  Resp: 20 20 (!) 24 20  Temp: 98.8 F (37.1 C) 98.5 F (36.9 C)  98.6 F (37 C)  TempSrc: Oral Oral  Oral  SpO2: 100% 100% 99% 100%  Weight:  93.1 kg  Intake/Output Summary (Last 24 hours) at 10/04/2020 0947 Last data filed at 10/03/2020 2054 Gross per 24 hour  Intake 550 ml  Output --  Net 550 ml   Filed Weights   10/04/20 0355  Weight: 93.1 kg    Examination:  General exam: Appears calm and comfortable, on 3 L of oxygen via nasal cannula, communicating well Respiratory system:  Tachypneic, bilateral coarse breath sounds noted  cardiovascular system: S1 & S2 heard, RRR. No JVD, murmurs, rubs, gallops or clicks.  Bilateral 1+ pitting edema positive  gastrointestinal system: Abdomen is nondistended, soft and nontender. No organomegaly or masses felt. Normal bowel sounds heard. Central nervous system: Alert and oriented. No focal neurological deficits. Extremities: Symmetric 5 x 5 power. Skin: No rashes, lesions or ulcers Psychiatry: Judgement and insight appear normal. Mood & affect appropriate.    Data Reviewed: I have personally reviewed following labs and imaging studies  CBC: Recent Labs  Lab 10/03/20 1500 10/04/20 0223  WBC 9.6 9.2  NEUTROABS 7.9*  --   HGB 11.8* 11.5*  HCT 38.1* 36.8*  MCV 107.0* 107.3*  PLT 191 747   Basic Metabolic Panel: Recent Labs  Lab 10/03/20 1314 10/04/20 0223 10/04/20 0824  NA 143 143  --   K 3.7 4.1  --   CL 110 105  --   CO2 21* 25  --   GLUCOSE 116* 79  --   BUN 32* 32*  --   CREATININE 2.85* 2.82*  --   CALCIUM 9.1 9.3  --   MG  --   --  2.1   GFR: Estimated Creatinine Clearance: 25.4 mL/min (A) (by C-G formula based on SCr of 2.82 mg/dL (H)). Liver Function Tests: Recent Labs  Lab 10/04/20 0223  AST 32  ALT 52*  ALKPHOS 80  BILITOT 0.5  PROT 7.0  ALBUMIN 3.8   No results for input(s): LIPASE, AMYLASE in the last 168 hours. No results for input(s): AMMONIA in the last 168 hours. Coagulation Profile: No results for input(s): INR, PROTIME in the last 168 hours. Cardiac Enzymes: No results for input(s): CKTOTAL, CKMB, CKMBINDEX, TROPONINI in the last 168 hours. BNP (last 3 results) Recent Labs    05/28/20 1538  PROBNP 64.0   HbA1C: No results for input(s): HGBA1C in the last 72 hours. CBG: No results for input(s): GLUCAP in the last 168 hours. Lipid Profile: No results for input(s): CHOL, HDL, LDLCALC, TRIG, CHOLHDL, LDLDIRECT in the last 72 hours. Thyroid Function Tests: No results for  input(s): TSH, T4TOTAL, FREET4, T3FREE, THYROIDAB in the last 72 hours. Anemia Panel: Recent Labs    10/04/20 0824  VITAMINB12 282  FERRITIN 273  TIBC 235*  IRON 19*   Sepsis Labs: Recent Labs  Lab 10/03/20 1716  PROCALCITON 0.13    Recent Results (from the past 240 hour(s))  Resp Panel by RT-PCR (Flu A&B, Covid) Nasopharyngeal Swab     Status: None   Collection Time: 10/03/20  1:55 PM   Specimen: Nasopharyngeal Swab; Nasopharyngeal(NP) swabs in vial transport medium  Result Value Ref Range Status   SARS Coronavirus 2 by RT PCR NEGATIVE NEGATIVE Final    Comment: (NOTE) SARS-CoV-2 target nucleic acids are NOT DETECTED.  The SARS-CoV-2 RNA is generally detectable in upper respiratory specimens during the acute phase of infection. The lowest concentration of SARS-CoV-2 viral copies this assay can detect is 138 copies/mL. A negative result does not preclude SARS-Cov-2 infection and should not be used as the sole basis for treatment  or other patient management decisions. A negative result may occur with  improper specimen collection/handling, submission of specimen other than nasopharyngeal swab, presence of viral mutation(s) within the areas targeted by this assay, and inadequate number of viral copies(<138 copies/mL). A negative result must be combined with clinical observations, patient history, and epidemiological information. The expected result is Negative.  Fact Sheet for Patients:  EntrepreneurPulse.com.au  Fact Sheet for Healthcare Providers:  IncredibleEmployment.be  This test is no t yet approved or cleared by the [place] FDA and  has been authorized for detection and/or diagnosis of SARS-CoV-2 by FDA under an Emergency Use Authorization (EUA). This EUA will remain  in effect (meaning this test can be used) for the duration of the COVID-19 declaration under Section 564(b)(1) of the Act, 21 U.S.C.section 360bbb-3(b)(1),  unless the authorization is terminated  or revoked sooner.       Influenza A by PCR NEGATIVE NEGATIVE Final   Influenza B by PCR NEGATIVE NEGATIVE Final    Comment: (NOTE) The Xpert Xpress SARS-CoV-2/FLU/RSV plus assay is intended as an aid in the diagnosis of influenza from Nasopharyngeal swab specimens and should not be used as a sole basis for treatment. Nasal washings and aspirates are unacceptable for Xpert Xpress SARS-CoV-2/FLU/RSV testing.  Fact Sheet for Patients: EntrepreneurPulse.com.au  Fact Sheet for Healthcare Providers: IncredibleEmployment.be  This test is not yet approved or cleared by the [place] FDA and has been authorized for detection and/or diagnosis of SARS-CoV-2 by FDA under an Emergency Use Authorization (EUA). This EUA will remain in effect (meaning this test can be used) for the duration of the COVID-19 declaration under Section 564(b)(1) of the Act, 21 U.S.C. section 360bbb-3(b)(1), unless the authorization is terminated or revoked.  Performed at [facility], [place] [address], [place],  [address]       Radiology Studies: DG Chest Port 1 View  Result Date: 10/04/2020 CLINICAL DATA:  Cough and wheezing.  Shortness of breath. EXAM: PORTABLE CHEST 1 VIEW COMPARISON:  October 03, 2020 FINDINGS: There is airspace opacity in the left mid lung region as well as in each lower lung region with left pleural effusion. There is cardiomegaly with pulmonary vascularity normal. Status post coronary artery bypass grafting. There is aortic atherosclerosis. No bone lesions. IMPRESSION: Airspace opacity left mid lung and both lower lung regions with left pleural effusion, essentially stable. Suspect multifocal pneumonia. A degree of superimposed pulmonary edema is possible. With respect to potential pneumonia, atypical organism pneumonia could present in this manner. Stable cardiac silhouette. Status post coronary artery  bypass grafting. Electronically Signed   By: Lowella Grip III M.D.   On: 10/04/2020 07:56   DG Chest Port 1 View  Result Date: 10/03/2020 CLINICAL DATA:  Shortness of breath EXAM: PORTABLE CHEST 1 VIEW COMPARISON:  11/06/2019 FINDINGS: Moderate left pleural effusion with left lower lobe atelectasis or infiltrate. Mild cardiomegaly. No confluent opacity on the right. Prior CABG. IMPRESSION: Moderate left pleural effusion and left lower lobe atelectasis or infiltrate. Electronically Signed   By: Rolm Baptise M.D.   On: 10/03/2020 15:48   VAS [place] LOWER EXTREMITY VENOUS (DVT) (ONLY [facility] & [facility])  Result Date: 10/03/2020  Lower Venous DVT Study Patient Name:  ELZA SORTOR  Date of Exam:   10/03/2020 Medical Rec #: [id number]        Accession #:    [id number] Date of Birth: 1945-05-14       Patient Gender: M Patient Age:   66Y Exam  Location:  [facility] Procedure:      VAS [place] LOWER EXTREMITY VENOUS (DVT) Referring Phys: [id number] BRITNI A HENDERLY --------------------------------------------------------------------------------  Indications: Edema.  Limitations: Body habitus, poor ultrasound/tissue interface and patient position. Comparison Study: 08/01/2019- negative lower extremity venous duplex Performing Technologist: Maudry Mayhew MHA, RDMS, RVT, RDCS  Examination Guidelines: A complete evaluation includes B-mode imaging, spectral Doppler, color Doppler, and power Doppler as needed of all accessible portions of each vessel. Bilateral testing is considered an integral part of a complete examination. Limited examinations for reoccurring indications may be performed as noted. The reflux portion of the exam is performed with the patient in reverse Trendelenburg.  Right Technical Findings: Not visualized segments include CFV.  +---------+---------------+---------+-----------+----------+--------------+ LEFT     CompressibilityPhasicitySpontaneityPropertiesThrombus Aging  +---------+---------------+---------+-----------+----------+--------------+ CFV      Full           Yes      Yes                                 +---------+---------------+---------+-----------+----------+--------------+ SFJ      Full                                                        +---------+---------------+---------+-----------+----------+--------------+ FV Prox  Full                                                        +---------+---------------+---------+-----------+----------+--------------+ FV Mid   Full                                                        +---------+---------------+---------+-----------+----------+--------------+ FV DistalFull                                                        +---------+---------------+---------+-----------+----------+--------------+ PFV      Full                                                        +---------+---------------+---------+-----------+----------+--------------+ POP      Full           Yes      Yes                                 +---------+---------------+---------+-----------+----------+--------------+ PTV      Full                                                        +---------+---------------+---------+-----------+----------+--------------+  PERO     Full                                                        +---------+---------------+---------+-----------+----------+--------------+     Summary: LEFT: - There is no evidence of deep vein thrombosis in the lower extremity.  - A cystic structure is found in the popliteal fossa.  *See table(s) above for measurements and observations. Electronically signed by Monica Martinez MD on 10/03/2020 at 7:41:44 PM.    Final     Scheduled Meds:  albuterol  5 mg Nebulization Q4H   aspirin EC  81 mg Oral Daily   atorvastatin  20 mg Oral QPM   clonazePAM  0.5 mg Oral BID   heparin injection (subcutaneous)  5,000 Units Subcutaneous Q8H    isosorbide dinitrate  30 mg Oral BID   metoprolol  200 mg Oral Daily   sertraline  25 mg Oral Daily   sodium chloride flush  3 mL Intravenous Q12H   Continuous Infusions:  sodium chloride     azithromycin     cefTRIAXone (ROCEPHIN)  IV       LOS: 1 day   Time spent: 40 minutes   Dakoda Bassette Loann Quill, MD [facility]  If 7PM-7AM, please contact night-coverage [email or web address] 10/04/2020, 9:47 AM

## 2020-10-05 LAB — CBC
HCT: 33 % — ABNORMAL LOW (ref 39.0–52.0)
Hemoglobin: 10.3 g/dL — ABNORMAL LOW (ref 13.0–17.0)
MCH: 33.3 pg (ref 26.0–34.0)
MCHC: 31.2 g/dL (ref 30.0–36.0)
MCV: 106.8 fL — ABNORMAL HIGH (ref 80.0–100.0)
Platelets: 164 10*3/uL (ref 150–400)
RBC: 3.09 MIL/uL — ABNORMAL LOW (ref 4.22–5.81)
RDW: 14.6 % (ref 11.5–15.5)
WBC: 7 10*3/uL (ref 4.0–10.5)
nRBC: 0 % (ref 0.0–0.2)

## 2020-10-05 LAB — BASIC METABOLIC PANEL
Anion gap: 11 (ref 5–15)
BUN: 45 mg/dL — ABNORMAL HIGH (ref 8–23)
CO2: 24 mmol/L (ref 22–32)
Calcium: 8.9 mg/dL (ref 8.9–10.3)
Chloride: 106 mmol/L (ref 98–111)
Creatinine, Ser: 2.8 mg/dL — ABNORMAL HIGH (ref 0.61–1.24)
GFR, Estimated: 23 mL/min — ABNORMAL LOW (ref >60)
Glucose, Bld: 94 mg/dL (ref 70–99)
Potassium: 3.3 mmol/L — ABNORMAL LOW (ref 3.5–5.1)
Sodium: 141 mmol/L (ref 135–145)

## 2020-10-05 LAB — GLUCOSE, CAPILLARY: Glucose-Capillary: 162 mg/dL — ABNORMAL HIGH (ref 70–99)

## 2020-10-05 LAB — MAGNESIUM: Magnesium: 2.3 mg/dL (ref 1.7–2.4)

## 2020-10-05 MED ORDER — ALBUTEROL SULFATE (2.5 MG/3ML) 0.083% IN NEBU
2.5000 mg | INHALATION_SOLUTION | Freq: Two times a day (BID) | RESPIRATORY_TRACT | Status: DC
  Administered 2020-10-05 (×2): 2.5 mg via RESPIRATORY_TRACT
  Filled 2020-10-05 (×2): qty 3

## 2020-10-05 MED ORDER — METOPROLOL SUCCINATE ER 50 MG PO TB24
150.0000 mg | ORAL_TABLET | Freq: Every day | ORAL | Status: DC
  Administered 2020-10-06 – 2020-10-07 (×2): 150 mg via ORAL
  Filled 2020-10-05 (×2): qty 1

## 2020-10-05 MED ORDER — TAMSULOSIN HCL 0.4 MG PO CAPS
0.4000 mg | ORAL_CAPSULE | Freq: Every day | ORAL | Status: DC
  Administered 2020-10-05 – 2020-10-06 (×2): 0.4 mg via ORAL
  Filled 2020-10-05 (×2): qty 1

## 2020-10-05 MED ORDER — ALBUTEROL SULFATE (2.5 MG/3ML) 0.083% IN NEBU: 2.5000 mg | INHALATION_SOLUTION | RESPIRATORY_TRACT | Status: DC | PRN

## 2020-10-05 NOTE — Progress Notes (Signed)
PROGRESS NOTE   Christian SCHARA  DXI:[id number] DOB: 1946/04/10 DOA: 10/03/2020 PCP: Hoyt Koch, MD  Brief Narrative:  76 year old black male community dwelling CAD status post stent 2015 Dr. Einar Gip Subsequent CABG X4 10/18/2019--complicated by TIA postprocedure--cardiogenic shock paroxysmal A. fib with cardioversion-mediastinal exploration secondary to need for sternal washout from hematoma-needed tracheotomy at the time as well , BPH, HLD, PE, DM TY 2 He had HD but was off of this since 02/14/2020 CKD 4 but planning for eventual dialysis with recent AV fistula creation 09/18/2020  Admit 10/03/2020 SOB DOE BNP 672 Rx ceftriaxone azithromycin also high-dose Lasix for acute pulmonary edema Nephrology consulted  Hospital-Problem based course  ?  Pneumonia Discontinue antibiotics-he is better mainly with Lasix and his breathing better I do not think this is infectious Repeat CXR a.m. Volume overload secondary to HFpEF decompensated in the setting of CKD 4 Weight unchanged still not positive Continue IV Lasix dosing 80 mg ii/day-get standing weights-strict I/O HTN Cut back metoprolol to 150 given volume overload Continue Isordil 30 ii/day Anemia of renal disease Hemoglobin 10.3 although iron saturations are low Recheck iron saturations in 3 weeks periodic hemoglobin and may need IV iron-continue ferrous sulfate at this time DM TY 2 Continue sliding scale insulin coverage --sugars 70s to 120  DVT prophylaxis: Heparin Code Status: Full Family Communication: None Disposition:  Status is: Inpatient  Remains inpatient appropriate because:Hemodynamically unstable, Persistent severe electrolyte disturbances, Ongoing diagnostic testing needed not appropriate for outpatient work up, and Unsafe d/c plan  Dispo: The patient is from: Home              Anticipated d/c is to: Home              Patient currently is not medically stable to d/c.   Difficult to place patient No        Consultants:  renal  Procedures:   Antimicrobials:  Stopped 6/11    Subjective: Looks well feels well no distress no fever no chills some cough which is improved with the diuretic no lower extremity edema breathing fair taken off oxygen in the room and he is satting 98% and has not walked around since he has been here no chest pain  Objective: Vitals:   10/05/20 0421 10/05/20 0751 10/05/20 0900 10/05/20 1046  BP: 138/71 (!) 150/88  132/78  Pulse: 84 90  91  Resp: 18 18  18   Temp: 98.7 F (37.1 C) 98.2 F (36.8 C)  98.2 F (36.8 C)  TempSrc: Oral Oral  Oral  SpO2: 100% 100% 100% 100%  Weight: 93.1 kg       Intake/Output Summary (Last 24 hours) at 10/05/2020 1451 Last data filed at 10/05/2020 1023 Gross per 24 hour  Intake 830 ml  Output 1050 ml  Net -220 ml   Filed Weights   10/04/20 0355 10/05/20 0421  Weight: 93.1 kg 93.1 kg    Examination:  Awake coherent no distress looks about stated age Chest clear no rales rhonchi no JVD abdomen soft no rebound trace lower extremity edema power 5/5 ROM intact no focal deficit neurologically intact to power Psych euthymic  Data Reviewed: personally reviewed   CBC    Component Value Date/Time   WBC 7.0 10/05/2020 0137   RBC 3.09 (L) 10/05/2020 0137   HGB 10.3 (L) 10/05/2020 0137   HCT 33.0 (L) 10/05/2020 0137   PLT 164 10/05/2020 0137   MCV 106.8 (H) 10/05/2020 0137   MCH 33.3 10/05/2020 7673  MCHC 31.2 10/05/2020 0137   RDW 14.6 10/05/2020 0137   LYMPHSABS 0.6 (L) 10/03/2020 1500   MONOABS 0.9 10/03/2020 1500   EOSABS 0.0 10/03/2020 1500   BASOSABS 0.1 10/03/2020 1500   CMP Latest Ref Rng & Units 10/05/2020 10/04/2020 10/03/2020  Glucose 70 - 99 mg/dL 94 79 116(H)  BUN 8 - 23 mg/dL 45(H) 32(H) 32(H)  Creatinine 0.61 - 1.24 mg/dL 2.80(H) 2.82(H) 2.85(H)  Sodium 135 - 145 mmol/L 141 143 143  Potassium 3.5 - 5.1 mmol/L 3.3(L) 4.1 3.7  Chloride 98 - 111 mmol/L 106 105 110  CO2 22 - 32 mmol/L 24 25 21(L)  Calcium  8.9 - 10.3 mg/dL 8.9 9.3 9.1  Total Protein 6.5 - 8.1 g/dL - 7.0 -  Total Bilirubin 0.3 - 1.2 mg/dL - 0.5 -  Alkaline Phos 38 - 126 U/L - 80 -  AST 15 - 41 U/L - 32 -  ALT 0 - 44 U/L - 52(H) -     Radiology Studies: DG Chest Port 1 View  Result Date: 10/04/2020 CLINICAL DATA:  Cough and wheezing.  Shortness of breath. EXAM: PORTABLE CHEST 1 VIEW COMPARISON:  October 03, 2020 FINDINGS: There is airspace opacity in the left mid lung region as well as in each lower lung region with left pleural effusion. There is cardiomegaly with pulmonary vascularity normal. Status post coronary artery bypass grafting. There is aortic atherosclerosis. No bone lesions. IMPRESSION: Airspace opacity left mid lung and both lower lung regions with left pleural effusion, essentially stable. Suspect multifocal pneumonia. A degree of superimposed pulmonary edema is possible. With respect to potential pneumonia, atypical organism pneumonia could present in this manner. Stable cardiac silhouette. Status post coronary artery bypass grafting. Electronically Signed   By: Lowella Grip III M.D.   On: 10/04/2020 07:56   DG Chest Port 1 View  Result Date: 10/03/2020 CLINICAL DATA:  Shortness of breath EXAM: PORTABLE CHEST 1 VIEW COMPARISON:  11/06/2019 FINDINGS: Moderate left pleural effusion with left lower lobe atelectasis or infiltrate. Mild cardiomegaly. No confluent opacity on the right. Prior CABG. IMPRESSION: Moderate left pleural effusion and left lower lobe atelectasis or infiltrate. Electronically Signed   By: Rolm Baptise M.D.   On: 10/03/2020 15:48   VAS [place] LOWER EXTREMITY VENOUS (DVT) (ONLY [facility] & [facility])  Result Date: 10/03/2020  Lower Venous DVT Study Patient Name:  HOLLY PRING  Date of Exam:   10/03/2020 Medical Rec #: [id number]        Accession #:    [id number] Date of Birth: 1945-05-26       Patient Gender: M Patient Age:   74Y Exam Location:  [facility] [place] Procedure:      VAS [place] LOWER EXTREMITY VENOUS (DVT)  Referring Phys: [id number] BRITNI A HENDERLY --------------------------------------------------------------------------------  Indications: Edema.  Limitations: Body habitus, poor ultrasound/tissue interface and patient position. Comparison Study: 08/01/2019- negative lower extremity venous duplex Performing Technologist: Maudry Mayhew MHA, RDMS, RVT, RDCS  Examination Guidelines: A complete evaluation includes B-mode imaging, spectral Doppler, color Doppler, and power Doppler as needed of all accessible portions of each vessel. Bilateral testing is considered an integral part of a complete examination. Limited examinations for reoccurring indications may be performed as noted. The reflux portion of the exam is performed with the patient in reverse Trendelenburg.  Right Technical Findings: Not visualized segments include CFV.  +---------+---------------+---------+-----------+----------+--------------+ LEFT     CompressibilityPhasicitySpontaneityPropertiesThrombus Aging +---------+---------------+---------+-----------+----------+--------------+ CFV      Full  Yes      Yes                                 +---------+---------------+---------+-----------+----------+--------------+ SFJ      Full                                                        +---------+---------------+---------+-----------+----------+--------------+ FV Prox  Full                                                        +---------+---------------+---------+-----------+----------+--------------+ FV Mid   Full                                                        +---------+---------------+---------+-----------+----------+--------------+ FV DistalFull                                                        +---------+---------------+---------+-----------+----------+--------------+ PFV      Full                                                         +---------+---------------+---------+-----------+----------+--------------+ POP      Full           Yes      Yes                                 +---------+---------------+---------+-----------+----------+--------------+ PTV      Full                                                        +---------+---------------+---------+-----------+----------+--------------+ PERO     Full                                                        +---------+---------------+---------+-----------+----------+--------------+     Summary: LEFT: - There is no evidence of deep vein thrombosis in the lower extremity.  - A cystic structure is found in the popliteal fossa.  *See table(s) above for measurements and observations. Electronically signed by Monica Martinez MD on 10/03/2020 at 7:41:44 PM.    Final      Scheduled Meds:  albuterol  2.5 mg Nebulization BID   aspirin EC  81 mg Oral Daily   atorvastatin  20 mg Oral QPM   clonazePAM  0.5 mg Oral BID   ferrous sulfate  325 mg Oral Q breakfast   furosemide  80 mg Oral BID WC   heparin injection (subcutaneous)  5,000 Units Subcutaneous Q8H   isosorbide dinitrate  30 mg Oral BID   metoprolol  200 mg Oral Daily   sertraline  25 mg Oral Daily   sodium chloride flush  3 mL Intravenous Q12H   Continuous Infusions:  sodium chloride       LOS: 2 days   Time spent: 8  Nita Sells, MD [facility] To contact the attending provider between 7A-7P or the covering provider during after hours 7P-7A, please log into the web site [email or web address] and access using universal [place] password for that web site. If you do not have the password, please call the hospital operator.  10/05/2020, 2:51 PM

## 2020-10-05 NOTE — Hospital Course (Addendum)
   Potassium 3.3 BUNs/creatinine 32/2.8-->45/2.8 Iron 19 saturation ratios 8

## 2020-10-06 ENCOUNTER — Inpatient Hospital Stay (HOSPITAL_COMMUNITY): Payer: Medicare Other

## 2020-10-06 LAB — GLUCOSE, CAPILLARY
Glucose-Capillary: 297 mg/dL — ABNORMAL HIGH (ref 70–99)
Glucose-Capillary: 149 mg/dL — ABNORMAL HIGH (ref 70–99)

## 2020-10-06 MED ORDER — POTASSIUM CHLORIDE CRYS ER 20 MEQ PO TBCR
40.0000 meq | EXTENDED_RELEASE_TABLET | Freq: Every day | ORAL | Status: DC
  Administered 2020-10-06 – 2020-10-07 (×2): 40 meq via ORAL
  Filled 2020-10-06 (×2): qty 2

## 2020-10-06 MED ORDER — FUROSEMIDE 10 MG/ML IJ SOLN
40.0000 mg | Freq: Once | INTRAMUSCULAR | Status: AC
  Administered 2020-10-06: 40 mg via INTRAVENOUS
  Filled 2020-10-06: qty 4

## 2020-10-06 NOTE — Progress Notes (Signed)
PROGRESS NOTE   Christian [name]  UDJ:[id number] DOB: 1946/02/15 DOA: 10/03/2020 PCP: Hoyt Koch, MD  Brief Narrative:  75 year old black male community dwelling CAD status post stent 2015 Dr. Einar Gip Subsequent CABG X4 10/18/2019--complicated by TIA postprocedure--cardiogenic shock paroxysmal A. fib with cardioversion-mediastinal exploration secondary to need for sternal washout from hematoma-needed tracheotomy at the time as well , BPH, HLD, PE, DM TY 2 He had HD but was off of this since 02/14/2020 CKD 4 but planning for eventual dialysis with recent AV fistula creation 09/18/2020  Admit 10/03/2020 SOB DOE BNP 672 Rx ceftriaxone azithromycin and this was subsequently discontinued rapidly on 6/11 as it was not felt he had pneumonia Also given high-dose Lasix for acute pulmonary edema  Await clinical stability in the next 24 hours and may be discharged then   Hospital-Problem based course  Pneumonia ruled out ABX DC 6/11--CXR 6/12 shows left basilar opacity possible atelectasis and fluid No fever no chills and no overt concerns for pneumonia at this time Volume overload secondary to HFpEF decompensated in the setting of CKD 4 Hypoxic respiratory failure secondary to volume overload Weight unchanged still not positive Continue IV Lasix dosing 80 mg ii/day-get standing weights Net fluid balance is even today Will give an extra dose of Lasix 40 today and see how respiratory status does HTN Cut back metoprolol to 150 given volume overload Continue Isordil 30 ii/day Anemia of renal disease  iron saturations are low Recheck iron  3 weeks periodic hemoglobin and may need IV iron-continue ferrous sulfate at this time DM TY 2 Continue sliding scale insulin coverage --sugars 162-297 eating 100% of meals  DVT prophylaxis: Heparin Code Status: Full Family Communication: None Disposition:  Status is: Inpatient  Remains inpatient appropriate because:Hemodynamically unstable,  Persistent severe electrolyte disturbances, Ongoing diagnostic testing needed not appropriate for outpatient work up, and Unsafe d/c plan  Dispo: The patient is from: Home              Anticipated d/c is to: Home in the next 24 to 48 hours              Patient currently is not medically stable to d/c.   Difficult to place patient No  Consultants:  renal  Procedures:   Antimicrobials:  Stopped 6/11    Subjective:  More cough and wheeze today Is back on oxygen No chest pain No nausea  Objective: Vitals:   10/06/20 0618 10/06/20 0913 10/06/20 1055 10/06/20 1150  BP:  124/67 124/67 118/62  Pulse:  82 83 81  Resp:  16 20 17   Temp:  98 F (36.7 C) 98.1 F (36.7 C) 98.3 F (36.8 C)  TempSrc:  Oral Oral Oral  SpO2:  100% 100% 100%  Weight: 93.8 kg       Intake/Output Summary (Last 24 hours) at 10/06/2020 1340 Last data filed at 10/06/2020 0915 Gross per 24 hour  Intake 240 ml  Output 800 ml  Net -560 ml    Filed Weights   10/04/20 0355 10/05/20 0421 10/06/20 0618  Weight: 93.1 kg 93.1 kg 93.8 kg    Examination:  coherent no distress looks about stated age Chest wheezes posterolaterally no JVD abdomen soft no rebound trace lower extremity edema  ROM intact no focal deficit neurologically intact to power Psych euthymic  Data Reviewed: personally reviewed   CBC    Component Value Date/Time   WBC 7.0 10/05/2020 0137   RBC 3.09 (L) 10/05/2020 0137   HGB 10.3 (L)  10/05/2020 0137   HCT 33.0 (L) 10/05/2020 0137   PLT 164 10/05/2020 0137   MCV 106.8 (H) 10/05/2020 0137   MCH 33.3 10/05/2020 0137   MCHC 31.2 10/05/2020 0137   RDW 14.6 10/05/2020 0137   LYMPHSABS 0.6 (L) 10/03/2020 1500   MONOABS 0.9 10/03/2020 1500   EOSABS 0.0 10/03/2020 1500   BASOSABS 0.1 10/03/2020 1500   CMP Latest Ref Rng & Units 10/05/2020 10/04/2020 10/03/2020  Glucose 70 - 99 mg/dL 94 79 116(H)  BUN 8 - 23 mg/dL 45(H) 32(H) 32(H)  Creatinine 0.61 - 1.24 mg/dL 2.80(H) 2.82(H) 2.85(H)   Sodium 135 - 145 mmol/L 141 143 143  Potassium 3.5 - 5.1 mmol/L 3.3(L) 4.1 3.7  Chloride 98 - 111 mmol/L 106 105 110  CO2 22 - 32 mmol/L 24 25 21(L)  Calcium 8.9 - 10.3 mg/dL 8.9 9.3 9.1  Total Protein 6.5 - 8.1 g/dL - 7.0 -  Total Bilirubin 0.3 - 1.2 mg/dL - 0.5 -  Alkaline Phos 38 - 126 U/L - 80 -  AST 15 - 41 U/L - 32 -  ALT 0 - 44 U/L - 52(H) -     Radiology Studies: DG CHEST PORT 1 VIEW  Result Date: 10/06/2020 CLINICAL DATA:  75 year old male with a history of pneumonia EXAM: PORTABLE CHEST 1 VIEW COMPARISON:  10/04/2020, 10/03/2020, 11/06/2019 FINDINGS: Cardiomediastinal silhouette unchanged in size and contour with surgical changes of median sternotomy and CABG. Similar appearance of opacity at the left lung base obscuring the left hemidiaphragm, the left heart border, with pleuroparenchymal thickening at the lateral left chest. This appears slightly better than the comparison. Aeration of the right lung relatively maintained IMPRESSION: Slight improvement in the left basilar opacity, likely a combination of pleural fluid, atelectasis/consolidation. Surgical changes of median sternotomy and CABG Electronically Signed   By: Corrie Mckusick D.O.   On: 10/06/2020 09:50     Scheduled Meds:  aspirin EC  81 mg Oral Daily   atorvastatin  20 mg Oral QPM   clonazePAM  0.5 mg Oral BID   ferrous sulfate  325 mg Oral Q breakfast   furosemide  40 mg Intravenous Once   furosemide  80 mg Oral BID WC   heparin injection (subcutaneous)  5,000 Units Subcutaneous Q8H   isosorbide dinitrate  30 mg Oral BID   metoprolol  150 mg Oral Daily   potassium chloride  40 mEq Oral Daily   sertraline  25 mg Oral Daily   sodium chloride flush  3 mL Intravenous Q12H   tamsulosin  0.4 mg Oral QPC supper   Continuous Infusions:  sodium chloride       LOS: 3 days   Time spent: 19  Nita Sells, MD [facility] To contact the attending provider between 7A-7P or the covering provider  during after hours 7P-7A, please log into the web site [email or web address] and access using universal [place] password for that web site. If you do not have the password, please call the hospital operator.  10/06/2020, 1:40 PM

## 2020-10-07 MED ORDER — POTASSIUM CHLORIDE CRYS ER 20 MEQ PO TBCR: 40.0000 meq | EXTENDED_RELEASE_TABLET | Freq: Every day | ORAL | 0 refills | Status: DC

## 2020-10-07 MED ORDER — METOPROLOL SUCCINATE ER 50 MG PO TB24: 150.0000 mg | ORAL_TABLET | Freq: Every day | ORAL | Status: DC

## 2020-10-07 MED ORDER — FUROSEMIDE 80 MG PO TABS: 120.0000 mg | ORAL_TABLET | Freq: Two times a day (BID) | ORAL | 0 refills | Status: DC

## 2020-10-07 MED ORDER — ISOSORBIDE DINITRATE 30 MG PO TABS: 30.0000 mg | ORAL_TABLET | Freq: Two times a day (BID) | ORAL | 1 refills | Status: DC

## 2020-10-07 NOTE — Care Management Important Message (Signed)
Important Message  Patient Details  Name: Christian [name] MRN: [id number] Date of Birth: [birth year]   Medicare Important Message Given:  Yes     [name] 10/07/2020, 10:28 AM

## 2020-10-07 NOTE — Discharge Summary (Signed)
Physician Discharge Summary  Christian [name] OIB:[id number] DOB: May 15, 1945 DOA: 10/03/2020  PCP: Hoyt Koch, MD  Admit date: 10/03/2020 Discharge date: 10/07/2020  Time spent: 27 minutes  Recommendations for Outpatient Follow-up:  Note escalation of diuretic doses, change of Imdur and metoprolol dosing on [facility] Outpatient close follow-up with Dr. Harrie Jeans of [place] [facility] will be set up Patient requires renal panel, CBC in 1 week, patient also requires iron studies in 3 weeks  Discharge Diagnoses:  MAIN problem for hospitalization   Acute decompensated diastolic heart failure present on admission Pneumonia ruled out and was not present on admission  Please see below for itemized issues addressed in [place]- refer to other progress notes for clarity if needed  Discharge Condition: Improved  Diet recommendation: Renal heart healthy diabetic  Filed Weights   10/05/20 0421 10/06/20 0618 10/07/20 0320  Weight: 93.1 kg 93.8 kg 93.4 kg    History of present illness:  75 year old black male community dwelling CAD status post stent 2015 Dr. Einar Gip Subsequent CABG X4 10/18/2019--complicated by TIA postprocedure--cardiogenic shock paroxysmal A. fib with cardioversion-mediastinal exploration secondary to need for sternal washout from hematoma-needed tracheotomy at the time as well , BPH, HLD, PE, DM TY 2 He had HD but was off of this since 02/14/2020 CKD 4 but planning for eventual dialysis with recent AV fistula creation 09/18/2020  Admit 10/03/2020 SOB DOE BNP 672 Rx ceftriaxone azithromycin and this was subsequently discontinued rapidly on 6/11 as it was not felt he had pneumonia Also given high-dose Lasix for acute pulmonary edema   Await clinical stability in the next 24 hours and may be discharged then    Hospital Course:  Pneumonia ruled out ABX DC 6/11--CXR 6/12 shows left basilar opacity possible atelectasis and fluid No fever no chills and no overt concerns for  pneumonia at this time Volume overload secondary to HFpEF decompensated in the setting of CKD 4 Hypoxic respiratory failure secondary to volume overload Weight unchanged still not positive Discussed with Dr. Hollie Salk of nephrology prior to discharge and we will increase him to p.o. Lasix 120 twice daily to help with diuresis Patient will need close follow-up in the outpatient setting for renal panel and will need further planning as per her primary nephrologist Dr. Royce Macadamia regarding reinitiation of dialysis versus not HTN Cut back metoprolol to 150 given volume overload Continue Isordil 30  but cut back the dosing to ii/day Anemia of renal disease  iron saturations are low Recheck iron  3 weeks periodic hemoglobin and may need IV iron-continue ferrous sulfate at this time DM TY 2 Continue sliding scale insulin coverage moderately controlled during hospital stay  Consultations: Nephrologist  Discharge Exam: Vitals:   10/07/20 0320 10/07/20 0752  BP: (!) 162/74 (!) 143/75  Pulse: 88 94  Resp: 18 20  Temp: 98 F (36.7 C) 98.4 F (36.9 C)  SpO2: 98% 98%    Subj on day of d/c   Awake coherent pleasant no distress sitting up in bed walked around a little bit felt slightly short of breath but seems improved No chest pain no fever ate breakfast this morning  General Exam on discharge  EOMI NCAT Pallor, arcus stenosis S1-S2 no murmur no gallop He has PVCs but is predominantly in normal sinus rhythm on the monitor  chest is clear no added sound no rales nor rhonchi today Abdomen is soft nontender no rebound   Discharge Instructions   Discharge Instructions     Diet - low sodium heart  healthy   Complete by: As directed    Increase activity slowly   Complete by: As directed    No wound care   Complete by: As directed       Allergies as of 10/07/2020   No Known Allergies      Medication List     STOP taking these medications    oxycodone 5 MG capsule Commonly known  as: OXY-IR       TAKE these medications    aspirin EC 81 MG tablet Take 81 mg by mouth daily.   atorvastatin 20 MG tablet Commonly known as: LIPITOR Take 1 tablet (20 mg total) by mouth every evening. What changed: when to take this   clonazePAM 0.5 MG disintegrating tablet Commonly known as: KLONOPIN TAKE 1 TABLET BY MOUTH TWICE A DAY   clopidogrel 75 MG tablet Commonly known as: PLAVIX Take 1 tablet by mouth daily.   CORICIDIN HBP COLD/COUGH/FLU PO Take 1 tablet by mouth at bedtime as needed.   cromolyn 4 % ophthalmic solution Commonly known as: OPTICROM Place 1 drop into both eyes every Wednesday.   [employer] Acetaminophen 325 MG tablet Generic drug: acetaminophen TAKE 2 CAPS BY MOUTH EVERY 6 HOURS AS NEEDED FOR PAIN FOR TEMP 100F OR ABOVE What changed: See the new instructions.   [employer] Senna Plus 8.6-50 MG tablet Generic drug: senna-docusate TAKE 2 TABLETS BY MOUTH AT BEDTIME FOR CONSTIPATION What changed: See the new instructions.   ferrous sulfate 325 (65 FE) MG tablet Take 325 mg by mouth daily with breakfast.   furosemide 80 MG tablet Commonly known as: LASIX Take 1.5 tablets (120 mg total) by mouth 2 (two) times daily with breakfast and lunch. What changed:  how much to take additional instructions Another medication with the same name was removed. Continue taking this medication, and follow the directions you see here.   hydrALAZINE 25 MG tablet Commonly known as: APRESOLINE Take 1 tablet (25 mg total) by mouth 3 (three) times daily.   isosorbide dinitrate 30 MG tablet Commonly known as: ISORDIL Take 1 tablet (30 mg total) by mouth 2 (two) times daily. What changed: when to take this   metoprolol succinate 50 MG 24 hr tablet Commonly known as: TOPROL-XL Take 3 tablets (150 mg total) by mouth daily. Start taking on: October 08, 2020 What changed:  medication strength how much to take additional instructions   multivitamin Tabs tablet Take 1 tablet  by mouth at bedtime.   nitroGLYCERIN 0.4 MG SL tablet Commonly known as: NITROSTAT Place 0.4 mg under the tongue every 5 (five) minutes as needed for chest pain.   oxyCODONE-acetaminophen 5-325 MG tablet Commonly known as: Percocet Take 1 tablet by mouth every 4 (four) hours as needed for severe pain.   potassium chloride SA 20 MEQ tablet Commonly known as: KLOR-CON Take 2 tablets (40 mEq total) by mouth daily for 15 days. Start taking on: October 08, 2020   PreviDent 5000 Booster Plus 1.1 % Pste Generic drug: Sodium Fluoride Take 1 application by mouth every evening.   sertraline 25 MG tablet Commonly known as: ZOLOFT TAKE 1 TABLET ONE TIME A DAY What changed: See the new instructions.   silodosin 8 MG Caps capsule Commonly known as: RAPAFLO TAKE 1 CAPSULE BY MOUTH EVERYDAY AT BEDTIME What changed: See the new instructions.   Vitamin D (Ergocalciferol) 1.25 MG (50000 UNIT) Caps capsule Commonly known as: DRISDOL Take 1 capsule (50,000 Units total) by mouth every 7 (seven) days. What  changed: when to take this       No Known Allergies    The results of significant diagnostics from this hospitalization (including imaging, microbiology, ancillary and laboratory) are listed below for reference.    Significant Diagnostic Studies: DG CHEST PORT 1 VIEW  Result Date: 10/06/2020 CLINICAL DATA:  75 year old male with a history of pneumonia EXAM: PORTABLE CHEST 1 VIEW COMPARISON:  10/04/2020, 10/03/2020, 11/06/2019 FINDINGS: Cardiomediastinal silhouette unchanged in size and contour with surgical changes of median sternotomy and CABG. Similar appearance of opacity at the left lung base obscuring the left hemidiaphragm, the left heart border, with pleuroparenchymal thickening at the lateral left chest. This appears slightly better than the comparison. Aeration of the right lung relatively maintained IMPRESSION: Slight improvement in the left basilar opacity, likely a combination of  pleural fluid, atelectasis/consolidation. Surgical changes of median sternotomy and CABG Electronically Signed   By: Corrie Mckusick D.O.   On: 10/06/2020 09:50   DG Chest Port 1 View  Result Date: 10/04/2020 CLINICAL DATA:  Cough and wheezing.  Shortness of breath. EXAM: PORTABLE CHEST 1 VIEW COMPARISON:  October 03, 2020 FINDINGS: There is airspace opacity in the left mid lung region as well as in each lower lung region with left pleural effusion. There is cardiomegaly with pulmonary vascularity normal. Status post coronary artery bypass grafting. There is aortic atherosclerosis. No bone lesions. IMPRESSION: Airspace opacity left mid lung and both lower lung regions with left pleural effusion, essentially stable. Suspect multifocal pneumonia. A degree of superimposed pulmonary edema is possible. With respect to potential pneumonia, atypical organism pneumonia could present in this manner. Stable cardiac silhouette. Status post coronary artery bypass grafting. Electronically Signed   By: Lowella Grip III M.D.   On: 10/04/2020 07:56   DG Chest Port 1 View  Result Date: 10/03/2020 CLINICAL DATA:  Shortness of breath EXAM: PORTABLE CHEST 1 VIEW COMPARISON:  11/06/2019 FINDINGS: Moderate left pleural effusion with left lower lobe atelectasis or infiltrate. Mild cardiomegaly. No confluent opacity on the right. Prior CABG. IMPRESSION: Moderate left pleural effusion and left lower lobe atelectasis or infiltrate. Electronically Signed   By: Rolm Baptise M.D.   On: 10/03/2020 15:48   VAS [place] UPPER EXTREMITY ARTERIAL DUPLEX  Result Date: 09/13/2020  UPPER EXTREMITY DUPLEX STUDY Patient Name:  LENNON BOUTWELL  Date of Exam:   09/13/2020 Medical Rec #: [id number]        Accession #:    [id number] Date of Birth: 13-Jun-1945       Patient Gender: M Patient Age:   74Y Exam Location:  [facility] Procedure:      VAS [place] UPPER EXTREMITY ARTERIAL DUPLEX Referring Phys: [id number] [place]  --------------------------------------------------------------------------------  Indications: Pre access.  Comparison Study: 02/09/20 Performing Technologist: June Leap RDMS, RVT  Examination Guidelines: A complete evaluation includes B-mode imaging, spectral Doppler, color Doppler, and power Doppler as needed of all accessible portions of each vessel. Bilateral testing is considered an integral part of a complete examination. Limited examinations for reoccurring indications may be performed as noted.  Right Pre-Dialysis Findings: +-----------------------+----------+--------------------+---------+--------+ Location               PSV (cm/s)Intralum. Diam. (cm)Waveform Comments +-----------------------+----------+--------------------+---------+--------+ Brachial Antecub. fossa84        0.62                triphasic         +-----------------------+----------+--------------------+---------+--------+ Radial Art at Wrist    70  0.27                triphasic         +-----------------------+----------+--------------------+---------+--------+ Ulnar Art at Wrist     62        0.35                triphasic         +-----------------------+----------+--------------------+---------+--------+  Left Pre-Dialysis Findings: +------------------+----------+------------------+---------+-------------------+ Location          PSV (cm/s)Intralum. Diam.   Waveform Comments                                        (cm)                                           +------------------+----------+------------------+---------+-------------------+ Brachial Antecub. 76        0.46              triphasic                    fossa                                                                      +------------------+----------+------------------+---------+-------------------+ Radial Art at                                          harvested for CABG  Wrist                                                   surgery             +------------------+----------+------------------+---------+-------------------+ Ulnar Art at Wrist79        0.46              triphasic                    +------------------+----------+------------------+---------+-------------------+  Summary:  Right: No obstruction visualized in the right upper extremity. Left: No obstruction visualized in the left upper extremity Left       radial artery harvest. *See table(s) above for measurements and observations. Electronically signed by Servando Snare MD on 09/13/2020 at 11:02:23 AM.    Final    VAS [place] LOWER EXTREMITY VENOUS (DVT) (ONLY [facility] & [facility])  Result Date: 10/03/2020  Lower Venous DVT Study Patient Name:  ARMARION GREEK  Date of Exam:   10/03/2020 Medical Rec #: [id number]        Accession #:    [id number] Date of Birth: [birth year]       Patient Gender: M Patient Age:   74Y Exam [facility]  [facility] Procedure:      VAS [place] LOWER EXTREMITY VENOUS (DVT) Referring Phys: [id number] BRITNI A HENDERLY --------------------------------------------------------------------------------  Indications: Edema.  Limitations:  Body habitus, poor ultrasound/tissue interface and patient position. Comparison Study: 08/01/2019- negative lower extremity venous duplex Performing Technologist: Maudry Mayhew MHA, RDMS, RVT, RDCS  Examination Guidelines: A complete evaluation includes B-mode imaging, spectral Doppler, color Doppler, and power Doppler as needed of all accessible portions of each vessel. Bilateral testing is considered an integral part of a complete examination. Limited examinations for reoccurring indications may be performed as noted. The reflux portion of the exam is performed with the patient in reverse Trendelenburg.  Right Technical Findings: Not visualized segments include CFV.  +---------+---------------+---------+-----------+----------+--------------+ LEFT     CompressibilityPhasicitySpontaneityPropertiesThrombus  Aging +---------+---------------+---------+-----------+----------+--------------+ CFV      Full           Yes      Yes                                 +---------+---------------+---------+-----------+----------+--------------+ SFJ      Full                                                        +---------+---------------+---------+-----------+----------+--------------+ FV Prox  Full                                                        +---------+---------------+---------+-----------+----------+--------------+ FV Mid   Full                                                        +---------+---------------+---------+-----------+----------+--------------+ FV DistalFull                                                        +---------+---------------+---------+-----------+----------+--------------+ PFV      Full                                                        +---------+---------------+---------+-----------+----------+--------------+ POP      Full           Yes      Yes                                 +---------+---------------+---------+-----------+----------+--------------+ PTV      Full                                                        +---------+---------------+---------+-----------+----------+--------------+ PERO     Full                                                        +---------+---------------+---------+-----------+----------+--------------+  Summary: LEFT: - There is no evidence of deep vein thrombosis in the lower extremity.  - A cystic structure is found in the popliteal fossa.  *See table(s) above for measurements and observations. Electronically signed by Monica Martinez MD on 10/03/2020 at 7:41:44 PM.    Final    VAS [place] UPPER EXT VEIN MAPPING (PRE-OP AVF)  Result Date: 09/13/2020 UPPER EXTREMITY VEIN MAPPING Patient Name:  EDDI HYMES  Date of Exam:   09/13/2020 Medical Rec #: [id number]        Accession #:     [id number] Date of Birth: 06-Aug-1945       Patient Gender: M Patient Age:   74Y Exam Location:  [facility] Procedure:      VAS [place] UPPER EXT VEIN MAPPING (PRE-OP AVF) Referring Phys: [id number] [place] --------------------------------------------------------------------------------  Indications: Pre-access. Comparison Study: 02/09/20 Performing Technologist: June Leap RDMS, RVT  Examination Guidelines: A complete evaluation includes B-mode imaging, spectral Doppler, color Doppler, and power Doppler as needed of all accessible portions of each vessel. Bilateral testing is considered an integral part of a complete examination. Limited examinations for reoccurring indications may be performed as noted. +-----------------+-------------+----------+---------+ Right Cephalic   Diameter (cm)Depth (cm)Findings  +-----------------+-------------+----------+---------+ Shoulder             0.16                         +-----------------+-------------+----------+---------+ Prox upper arm                          tortuous  +-----------------+-------------+----------+---------+ Mid upper arm        0.20                         +-----------------+-------------+----------+---------+ Dist upper arm       0.19                         +-----------------+-------------+----------+---------+ Antecubital fossa    0.27                         +-----------------+-------------+----------+---------+ Prox forearm         0.34               branching +-----------------+-------------+----------+---------+ Mid forearm          0.27                         +-----------------+-------------+----------+---------+ Wrist                0.33                         +-----------------+-------------+----------+---------+ +-----------------+-------------+----------+---------+ Right Basilic    Diameter (cm)Depth (cm)Findings   +-----------------+-------------+----------+---------+ Prox upper arm       0.61                         +-----------------+-------------+----------+---------+ Mid upper arm        0.67                         +-----------------+-------------+----------+---------+ Dist upper arm       0.54               branching +-----------------+-------------+----------+---------+  Antecubital fossa    0.51                         +-----------------+-------------+----------+---------+ +-----------------+-------------+----------+---------+ Left Cephalic    Diameter (cm)Depth (cm)Findings  +-----------------+-------------+----------+---------+ Shoulder             0.26                         +-----------------+-------------+----------+---------+ Mid upper arm        0.26                         +-----------------+-------------+----------+---------+ Dist upper arm       0.24                         +-----------------+-------------+----------+---------+ Antecubital fossa    0.41                         +-----------------+-------------+----------+---------+ Prox forearm         0.34               branching +-----------------+-------------+----------+---------+ Mid forearm          0.33                         +-----------------+-------------+----------+---------+ Wrist                0.36                         +-----------------+-------------+----------+---------+ +-----------------+-------------+----------+---------+ Left Basilic     Diameter (cm)Depth (cm)Findings  +-----------------+-------------+----------+---------+ Prox upper arm       0.52                         +-----------------+-------------+----------+---------+ Mid upper arm        0.57                         +-----------------+-------------+----------+---------+ Dist upper arm       0.57                         +-----------------+-------------+----------+---------+ Antecubital fossa     0.54               branching +-----------------+-------------+----------+---------+ *See table(s) above for measurements and observations.  Diagnosing physician: Servando Snare MD Electronically signed by Servando Snare MD on 09/13/2020 at 11:02:14 AM.    Final     Microbiology: Recent Results (from the past 240 hour(s))  Resp Panel by RT-PCR (Flu A&B, Covid) Nasopharyngeal Swab     Status: None   Collection Time: 10/03/20  1:55 PM   Specimen: Nasopharyngeal Swab; Nasopharyngeal(NP) swabs in vial transport medium  Result Value Ref Range Status   SARS Coronavirus 2 by RT PCR NEGATIVE NEGATIVE Final    Comment: (NOTE) SARS-CoV-2 target nucleic acids are NOT DETECTED.  The SARS-CoV-2 RNA is generally detectable in upper respiratory specimens during the acute phase of infection. The lowest concentration of SARS-CoV-2 viral copies this assay can detect is 138 copies/mL. A negative result does not preclude SARS-Cov-2 infection and should not be used as the sole basis for treatment or other patient management decisions. A negative result may occur with  improper specimen collection/handling, submission  of specimen other than nasopharyngeal swab, presence of viral mutation(s) within the areas targeted by this assay, and inadequate number of viral copies(<138 copies/mL). A negative result must be combined with clinical observations, patient history, and epidemiological information. The expected result is Negative.  Fact Sheet for Patients:  EntrepreneurPulse.com.au  Fact Sheet for Healthcare Providers:  IncredibleEmployment.be  This test is no t yet approved or cleared by the [place] FDA and  has been authorized for detection and/or diagnosis of SARS-CoV-2 by FDA under an Emergency Use Authorization (EUA). This EUA will remain  in effect (meaning this test can be used) for the duration of the COVID-19 declaration under Section 564(b)(1) of the Act,  21 U.S.C.section 360bbb-3(b)(1), unless the authorization is terminated  or revoked sooner.       Influenza A by PCR NEGATIVE NEGATIVE Final   Influenza B by PCR NEGATIVE NEGATIVE Final    Comment: (NOTE) The Xpert Xpress SARS-CoV-2/FLU/RSV plus assay is intended as an aid in the diagnosis of influenza from Nasopharyngeal swab specimens and should not be used as a sole basis for treatment. Nasal washings and aspirates are unacceptable for Xpert Xpress SARS-CoV-2/FLU/RSV testing.  Fact Sheet for Patients: EntrepreneurPulse.com.au  Fact Sheet for Healthcare Providers: IncredibleEmployment.be  This test is not yet approved or cleared by the [place] FDA and has been authorized for detection and/or diagnosis of SARS-CoV-2 by FDA under an Emergency Use Authorization (EUA). This EUA will remain in effect (meaning this test can be used) for the duration of the COVID-19 declaration under Section 564(b)(1) of the Act, 21 U.S.C. section 360bbb-3(b)(1), unless the authorization is terminated or revoked.  Performed at [facility], [place] [address], [place], [place] [address]      Labs: Basic Metabolic Panel: Recent Labs  Lab 10/03/20 1314 10/04/20 0223 10/04/20 0824 10/05/20 0137  NA 143 143  --  141  K 3.7 4.1  --  3.3*  CL 110 105  --  106  CO2 21* 25  --  24  GLUCOSE 116* 79  --  94  BUN 32* 32*  --  45*  CREATININE 2.85* 2.82*  --  2.80*  CALCIUM 9.1 9.3  --  8.9  MG  --   --  2.1 2.3   Liver Function Tests: Recent Labs  Lab 10/04/20 0223  AST 32  ALT 52*  ALKPHOS 80  BILITOT 0.5  PROT 7.0  ALBUMIN 3.8   No results for input(s): LIPASE, AMYLASE in the last 168 hours. No results for input(s): AMMONIA in the last 168 hours. CBC: Recent Labs  Lab 10/03/20 1500 10/04/20 0223 10/05/20 0137  WBC 9.6 9.2 7.0  NEUTROABS 7.9*  --   --   HGB 11.8* 11.5* 10.3*  HCT 38.1* 36.8* 33.0*  MCV 107.0* 107.3* 106.8*  PLT  191 172 164   Cardiac Enzymes: No results for input(s): CKTOTAL, CKMB, CKMBINDEX, TROPONINI in the last 168 hours. BNP: BNP (last 3 results) Recent Labs    10/03/20 1500  BNP 672.7*    ProBNP (last 3 results) Recent Labs    05/28/20 1538  PROBNP 64.0    CBG: Recent Labs  Lab 10/05/20 1621 10/06/20 1149 10/06/20 1645  GLUCAP 162* 297* 149*       Signed:  Nita Sells MD   [facility] 10/07/2020, 9:45 AM

## 2020-10-07 NOTE — Progress Notes (Signed)
SATURATION QUALIFICATIONS: (This note is used to comply with regulatory documentation for home oxygen)  Patient Saturations on Room Air at Rest =99%  Patient Saturations on Room Air while Ambulating = 97%    Please briefly explain why patient needs home oxygen: pt does not qualify for home O2.

## 2020-10-08 ENCOUNTER — Other Ambulatory Visit: Payer: Self-pay

## 2020-10-08 MED ORDER — METOPROLOL SUCCINATE ER 50 MG PO TB24: 150.0000 mg | ORAL_TABLET | Freq: Every day | ORAL | 1 refills | Status: DC

## 2020-10-09 ENCOUNTER — Other Ambulatory Visit: Payer: Self-pay | Admitting: Cardiology

## 2020-10-09 ENCOUNTER — Telehealth: Payer: Self-pay

## 2020-10-09 DIAGNOSIS — I25118 Atherosclerotic heart disease of native coronary artery with other forms of angina pectoris: Secondary | ICD-10-CM

## 2020-10-09 MED ORDER — CLOPIDOGREL BISULFATE 75 MG PO TABS: 1.0000 | ORAL_TABLET | Freq: Every day | ORAL | 0 refills | Status: DC

## 2020-10-09 NOTE — Telephone Encounter (Signed)
Patients wife stated that you stopped clopidgogrel at the last office visit. He was in the hospital 10/03/20 and got out 10/07/20. He went because he had fluid on the left lung and SOB. They told him to take the clopidogrel. Now the pt is almost out and is unsure if he is suppose to continue taking it or not.

## 2020-10-09 NOTE — Telephone Encounter (Signed)
I sent Rx.   Unless he makes lifestyle changes and looses weight and starts strictly watching his diet, he will continue to have this issue.   Please set up OV with me in next 2-3 weeks and let Lauren know that he should be screened for PLUX-Sx TRENDS study (loop implantation for heart failure sensor).

## 2020-10-10 NOTE — Telephone Encounter (Signed)
Called and spoke to pt. Pt aware. The front will be scheduling and office visit today for 2-3 weeks.

## 2020-10-12 DIAGNOSIS — R262 Difficulty in walking, not elsewhere classified: Secondary | ICD-10-CM | POA: Diagnosis not present

## 2020-10-12 DIAGNOSIS — N186 End stage renal disease: Secondary | ICD-10-CM | POA: Diagnosis not present

## 2020-10-12 DIAGNOSIS — Z992 Dependence on renal dialysis: Secondary | ICD-10-CM | POA: Diagnosis not present

## 2020-10-12 DIAGNOSIS — I214 Non-ST elevation (NSTEMI) myocardial infarction: Secondary | ICD-10-CM | POA: Diagnosis not present

## 2020-10-13 NOTE — Progress Notes (Signed)
Primary Physician/Referring:  Hoyt Koch, MD  Patient ID: Christian [name], male    DOB: 1945/09/12, 75 y.o.   MRN: [id number]  Chief Complaint  Patient presents with   Shortness of Breath   Follow-up   HPI:    Christian [name]  is a 75 y.o. AA male with hypertension, hyperlipidemia, DM with stage 3 CKD,  CAD with  stenting to the PDA on 02/27/2014. Patient presented with unstable angina and NSTEMI on 08/09/2019 along with a very small subsegmental pulmonary embolism and discharged on 10/18/2019 after he underwent CABG x4 on 08/15/2019.    He had extremely complex postop complications including cardiogenic shock, cardiac arrest, needing internal CPR in the ICU after opening the chest, A. Fib needing cardioversion, closure office chest wall, developed acute renal failure leading to need for permanent dialysis, right brain infarct with left hemiparesis, multiple blood transfusions.  He was also placed on dialysis however is now off of dialysis since 02/01/2020.   Patient hospitalized 10/03/2020 - 10/07/2020 with acute decompensated diastolic heart failure in the setting of CKD stage IV.  Patient was diuresed and discharged with Lasix 120 mg twice daily.  Patient's metoprolol was also reduced to 150 mg given acute decompensation of heart failure. He now presents for follow up.   Patient reports that since discharge she has been feeling relatively well.  States his chronic dyspnea is back to baseline, however he has noticed some increased fatigue since hospitalization.  Patient denies chest pain, palpitations, orthopnea, PND.  He is presently closely managed by nephrology for chronic kidney disease as well as anemia related to this.  He is scheduled to see his nephrologist in 2 weeks.  Patient does admit to continued dietary noncompliance.  Past Medical History:  Diagnosis Date   Anemia    low iron   BPH (benign prostatic hypertrophy)    CHF (congestive heart failure) (HCC)    Chronic  kidney disease    Coronary artery disease    Dry eyes left   Hiatal hernia    Hx of CABG 08/15/2019: x 4 using bilateral IMAs and left radial artery .  LIMA TO LAD, RIMA TO PDA, RADIAL ARTERY TO CIRC AND SEQUENTIALLY TO OM1. 08/15/2019   Hyperlipidemia    Hypertension    Incomplete bladder emptying    Myocardial infarction ([place]) 08/09/2019   Nocturia    PE (pulmonary thromboembolism) ([place]) 08/09/2019   small RLL PE 08/09/19   Pneumonia    as a child   Pre-diabetes    Problems with swallowing pt states test at [facility] approx 2012 shows a gastric valve  dysfunction--  eats small bites and drink liquids slowly   SOB (shortness of breath) on exertion    Past Surgical History:  Procedure Laterality Date   APPLICATION OF WOUND VAC N/A 08/24/2019   Procedure: APPLICATION OF WOUND VAC;  Surgeon: [name], MD;  Location: [place];  Service: Thoracic;  Laterality: N/A;   APPLICATION OF WOUND VAC  08/29/2019   Procedure: Wound Vac change;  Surgeon: [name], MD;  Location: [place] [facility];  Service: Open Heart Surgery;;   APPLICATION OF WOUND VAC N/A 09/04/2019   Procedure: Application Of Wound Vac;  Surgeon: Grace Axzel, MD;  Location: [place];  Service: Open Heart Surgery;  Laterality: N/A;   APPLICATION OF WOUND VAC N/A 09/06/2019   Procedure: APPLICATION OF ACELL, APPLICATION OF WOUND VAC USING PREVENA INCISIONAL  DRESSING;  Surgeon: Wallace Going, DO;  Location: [place];  Service: Plastics;  Laterality: N/A;   AV FISTULA PLACEMENT Left 09/18/2020   Procedure: ARTERIOVENOUS (AV) FISTULA CREATION LEFT VERSUS GRAFT;  Surgeon: Serafina Mitchell, MD;  Location: [place];  Service: Vascular;  Laterality: Left;   CARDIAC CATHETERIZATION     CORONARY ARTERY BYPASS GRAFT N/A 08/15/2019   Procedure: CORONARY ARTERY BYPASS GRAFTING (CABG), x 4 using bilateral IMAs and left radial artery .  LIMA TO LAD, RIMA TO PDA, RADIAL ARTERY TO CIRC AND SEQUENTIALLY TO OM1.;  Surgeon: [name], MD;   Location: [place];  Service: Open Heart Surgery;  Laterality: N/A;   CORONARY STENT PLACEMENT  02/27/2014   distal rt/pd coronary       dr Einar Gip   CYSTO/ BLADDER BIOPSY'S/ CAUTHERIZATION  01-14-2004  DR Gaynelle Arabian   EXPLORATION POST OPERATIVE OPEN HEART N/A 08/16/2019   Procedure: Chest Closure S?P CABG WITH APPLICATION OF PREVENA  INCISIONAL WOUND VAC;  Surgeon: [name], MD;  Location: [place];  Service: Open Heart Surgery;  Laterality: N/A;   EXPLORATION POST OPERATIVE OPEN HEART N/A 08/21/2019   Procedure: CHEST WASHOUT S/P OPEN CHEST;  Surgeon: [name], MD;  Location: [place];  Service: Open Heart Surgery;  Laterality: N/A;  Open chest with Esmark dressing with Ioban sealant coverage.   EXPLORATION POST OPERATIVE OPEN HEART N/A 08/18/2019   Procedure: EXPLORATION POST OPERATIVE OPEN HEART (performed 04/23 on unit);  Surgeon: [name], MD;  Location: [place];  Service: Open Heart Surgery;  Laterality: N/A;   EXPLORATION POST OPERATIVE OPEN HEART N/A 08/24/2019   Procedure: CHEST WASHOUT POST OPERATIVE OPEN HEART;  Surgeon: [name], MD;  Location: [place];  Service: Open Heart Surgery;  Laterality: N/A;   EXPLORATION POST OPERATIVE OPEN HEART N/A 08/29/2019   Procedure: CHEST WOUND WASHOUT POST OPERATIVE OPEN HEART;  Surgeon: [name], MD;  Location: [place];  Service: Open Heart Surgery;  Laterality: N/A;   EXPLORATION POST OPERATIVE OPEN HEART N/A 09/04/2019   Procedure: MEDIASTINAL EXPLORATION WITH STERNAL WOUND IRRIGATION;  Surgeon: Grace Callum, MD;  Location: [place];  Service: Open Heart Surgery;  Laterality: N/A;   EXPLORATION POST OPERATIVE OPEN HEART N/A 09/14/2019   Procedure: EVACUATION OF HEMATOMA;  Surgeon: Grace Creston, MD;  Location: [place];  Service: Open Heart Surgery;  Laterality: N/A;   IR FLUORO GUIDE CV LINE LEFT  09/19/2019   IR GASTROSTOMY TUBE MOD SED  10/04/2019   IR GASTROSTOMY TUBE REMOVAL  11/29/2019   IR PATIENT EVAL TECH 0-60 MINS   09/29/2019   IR US GUIDE VASC ACCESS LEFT  09/19/2019   LAPAROSCOPIC LYSIS OF ADHESIONS N/A 09/06/2019   Procedure: LAPAROSCOPIC OMENTAL HARVEST;  Surgeon: Mickeal Skinner, MD;  Location: [place];  Service: General;  Laterality: N/A;   LEFT HEART CATH AND CORONARY ANGIOGRAPHY N/A 08/10/2019   Procedure: LEFT HEART CATH AND CORONARY ANGIOGRAPHY;  Surgeon: Nigel Mormon, MD;  Location: [place] [facility];  Service: Cardiovascular;  Laterality: N/A;   LEFT HEART CATHETERIZATION WITH CORONARY ANGIOGRAM N/A 02/27/2014   Procedure: LEFT HEART CATHETERIZATION WITH CORONARY ANGIOGRAM;  Surgeon: Laverda Page, MD;  Location: [facility] C[facility] LAB;  Service: Cardiovascular;  Laterality: N/A;   MEDIASTINAL EXPLORATION N/A 09/06/2019   Procedure: MEDIASTINAL EXPLORATION;  Surgeon: Grace Darelle, MD;  Location: [place];  Service: Thoracic;  Laterality: N/A;   PECTORALIS FLAP  09/06/2019   Procedure: Pectoralis ADVANCEMENT Flap;  Surgeon: Marla Roe,  Loel Lofty, DO;  Location: [place];  Service: Plastics;;   PERCUTANEOUS CORONARY STENT INTERVENTION (PCI-S)  02/27/2014   Procedure: PERCUTANEOUS CORONARY STENT INTERVENTION (PCI-S);  Surgeon: Laverda Page, MD;  Location: [facility] C[facility];  Service: Cardiovascular;;  rt PDA  3.0/28mm Promus stent   RADIAL ARTERY HARVEST Left 08/15/2019   Procedure: Radial Artery Harvest;  Surgeon: [name], MD;  Location: [place];  Service: Open Heart Surgery;  Laterality: Left;   RIB PLATING N/A 09/06/2019   Procedure: STERNAL PLATING;  Surgeon: Grace Nasim, MD;  Location: [place];  Service: Thoracic;  Laterality: N/A;   TEE WITHOUT CARDIOVERSION N/A 08/15/2019   Procedure: TRANSESOPHAGEAL ECHOCARDIOGRAM (TEE);  Surgeon: [name], MD;  Location: [place];  Service: Open Heart Surgery;  Laterality: N/A;   TRACHEOSTOMY TUBE PLACEMENT  08/29/2019   Procedure: Tracheostomy;  Surgeon: [name], MD;  Location: [facility];  Service: Open Heart Surgery; Decannulated  6/10/20211   TRANSURETHRAL RESECTION OF PROSTATE  04/04/2012   Procedure: TRANSURETHRAL RESECTION OF THE PROSTATE WITH GYRUS INSTRUMENTS;  Surgeon: Ailene Rud, MD;  [facility];  Service: Urology;  Laterality: N/A;   TRANSURETHRAL RESECTION OF PROSTATE N/A 09/27/2014   Procedure: TRANSURETHRAL RESECTION OF THE PROSTATE ;  Surgeon: Carolan Clines, MD;  Location: [facility];  Service: Urology;  Laterality: N/A;   UPPER GASTROINTESTINAL ENDOSCOPY     Family History  Problem Relation Age of Onset   Diabetes Mother    Hypertension Mother    Diabetes Father    Diabetes Brother    Hypertension Brother    Bone cancer Brother    Diabetes Brother    Social History   Tobacco Use   Smoking status: Former    Packs/day: 0.50    Years: 20.00    Pack years: 10.00    Types: Cigarettes    Quit date: 1975    Years since quitting: 47.5   Smokeless tobacco: Never  Substance Use Topics   Alcohol use: Not Currently    Alcohol/week: 0.0 standard drinks    Comment: 1 beer week rarely   ROS   Review of Systems  Constitutional: Positive for malaise/fatigue.  Cardiovascular:  Negative for chest pain, dyspnea on exertion, leg swelling, orthopnea and paroxysmal nocturnal dyspnea.  Musculoskeletal:  Positive for joint pain.  Gastrointestinal:  Negative for melena.  Neurological:  Positive for disturbances in coordination (left sided weakness) and loss of balance.   Objective  Blood pressure (!) 142/84, pulse 85, temperature 97.7 F (36.5 C), height 5\' 7"  (1.702 m), weight 206 lb (93.4 kg), SpO2 96 %.  Vitals with BMI 10/14/2020 10/14/2020 10/07/2020  Height - 5\' 7"  -  Weight - 206 lbs -  BMI - 82.95 -  Systolic 621 308 657  Diastolic 84 85 75  Pulse 85 87 94    Physical Exam Vitals reviewed.  Constitutional:      Comments: He is moderately built and mildly obese in no acute distress.  Neck:     Thyroid: No thyromegaly.  Cardiovascular:     Rate and Rhythm:  Normal rate and regular rhythm.     Pulses:          Carotid pulses are 2+ on the right side and 2+ on the left side.      Femoral pulses are 2+ on the right side and 2+ on the left side.      Popliteal pulses are 0 on the right side  and 0 on the left side.       Dorsalis pedis pulses are 0 on the right side and 0 on the left side.       Posterior tibial pulses are 0 on the right side and 0 on the left side.     Heart sounds: Normal heart sounds. No murmur heard.   No gallop.     Comments: NO pedal edema, no JVD.  Pulmonary:     Effort: Pulmonary effort is normal.     Breath sounds: Normal breath sounds. No wheezing, rhonchi or rales.  Chest:     Comments: Sternotomy scar has healed well   Laboratory examination:   Recent Labs    10/17/19 0254 10/18/19 0046 12/20/19 1312 10/03/20 1314 10/04/20 0223 10/05/20 0137  NA 131* 132*   < > 143 143 141  K 3.9 3.9   < > 3.7 4.1 3.3*  CL 95* 96*   < > 110 105 106  CO2 21* 23   < > 21* 25 24  GLUCOSE 187* 175*   < > 116* 79 94  BUN 83* 37*   < > 32* 32* 45*  CREATININE 6.48* 3.55*   < > 2.85* 2.82* 2.80*  CALCIUM 9.0 9.1   < > 9.1 9.3 8.9  GFRNONAA 8* 16*  --  23* 23* 23*  GFRAA 9* 19*  --   --   --   --    < > = values in this interval not displayed.   estimated creatinine clearance is 25.6 mL/min (A) (by C-G formula based on SCr of 2.8 mg/dL (H)).  CMP Latest Ref Rng & Units 10/05/2020 10/04/2020 10/03/2020  Glucose 70 - 99 mg/dL 94 79 116(H)  BUN 8 - 23 mg/dL 45(H) 32(H) 32(H)  Creatinine 0.61 - 1.24 mg/dL 2.80(H) 2.82(H) 2.85(H)  Sodium 135 - 145 mmol/L 141 143 143  Potassium 3.5 - 5.1 mmol/L 3.3(L) 4.1 3.7  Chloride 98 - 111 mmol/L 106 105 110  CO2 22 - 32 mmol/L 24 25 21(L)  Calcium 8.9 - 10.3 mg/dL 8.9 9.3 9.1  Total Protein 6.5 - 8.1 g/dL - 7.0 -  Total Bilirubin 0.3 - 1.2 mg/dL - 0.5 -  Alkaline Phos 38 - 126 U/L - 80 -  AST 15 - 41 U/L - 32 -  ALT 0 - 44 U/L - 52(H) -   CBC Latest Ref Rng & Units 10/05/2020 10/04/2020  10/03/2020  WBC 4.0 - 10.5 K/uL 7.0 9.2 9.6  Hemoglobin 13.0 - 17.0 g/dL 10.3(L) 11.5(L) 11.8(L)  Hematocrit 39.0 - 52.0 % 33.0(L) 36.8(L) 38.1(L)  Platelets 150 - 400 K/uL 164 172 191   Lipid Panel No results for input(s): CHOL, TRIG, LDLCALC, VLDL, HDL, CHOLHDL, LDLDIRECT in the last 8760 hours.  HEMOGLOBIN A1C Lab Results  Component Value Date   HGBA1C 5.4 03/25/2020   MPG 94 12/20/2019   TSH Recent Labs    05/28/20 1538  TSH 1.24   BNP    Component Value Date/Time   BNP 672.7 (H) 10/03/2020 1500    ProBNP    Component Value Date/Time   PROBNP 64.0 05/28/2020 1538   Allergies  No Known Allergies    Medications Prior to Visit:   Outpatient Medications Prior to Visit  Medication Sig Dispense Refill   aspirin EC 81 MG tablet Take 81 mg by mouth daily.     atorvastatin (LIPITOR) 20 MG tablet Take 1 tablet (20 mg total) by mouth every evening. 90 tablet  3   clonazePAM (KLONOPIN) 0.5 MG disintegrating tablet TAKE 1 TABLET BY MOUTH TWICE A DAY 60 tablet 5   clopidogrel (PLAVIX) 75 MG tablet Take 1 tablet (75 mg total) by mouth daily. 90 tablet 0   cromolyn (OPTICROM) 4 % ophthalmic solution Place 1 drop into both eyes every Wednesday.     [employer] ACETAMINOPHEN 325 MG tablet TAKE 2 CAPS BY MOUTH EVERY 6 HOURS AS NEEDED FOR PAIN FOR TEMP 100F OR ABOVE 60 tablet 0   [employer] SENNA PLUS 8.6-50 MG tablet TAKE 2 TABLETS BY MOUTH AT BEDTIME FOR CONSTIPATION 60 tablet 1   Dextromethorphan-GG-APAP (CORICIDIN HBP COLD/COUGH/FLU PO) Take 1 tablet by mouth at bedtime as needed.     ferrous sulfate 325 (65 FE) MG tablet Take 325 mg by mouth daily with breakfast.     furosemide (LASIX) 80 MG tablet Take 1.5 tablets (120 mg total) by mouth 2 (two) times daily with breakfast and lunch. 90 tablet 0   isosorbide dinitrate (ISORDIL) 30 MG tablet Take 1 tablet (30 mg total) by mouth 2 (two) times daily. 270 tablet 1   metoprolol succinate (TOPROL-XL) 50 MG 24 hr tablet Take 3 tablets (150 mg total) by  mouth daily. 90 tablet 1   multivitamin (RENA-VIT) TABS tablet Take 1 tablet by mouth at bedtime.  0   nitroGLYCERIN (NITROSTAT) 0.4 MG SL tablet Place 0.4 mg under the tongue every 5 (five) minutes as needed for chest pain.     oxyCODONE-acetaminophen (PERCOCET) 5-325 MG tablet Take 1 tablet by mouth every 4 (four) hours as needed for severe pain. 5 tablet 0   potassium chloride SA (KLOR-CON) 20 MEQ tablet Take 2 tablets (40 mEq total) by mouth daily for 15 days. 30 tablet 0   PREVIDENT 5000 BOOSTER PLUS 1.1 % PSTE Take 1 application by mouth every evening.     sertraline (ZOLOFT) 25 MG tablet TAKE 1 TABLET ONE TIME A DAY 90 tablet 1   silodosin (RAPAFLO) 8 MG CAPS capsule TAKE 1 CAPSULE BY MOUTH EVERYDAY AT BEDTIME 90 capsule 3   Vitamin D, Ergocalciferol, (DRISDOL) 1.25 MG (50000 UNIT) CAPS capsule Take 1 capsule (50,000 Units total) by mouth every 7 (seven) days. 12 capsule 3   hydrALAZINE (APRESOLINE) 25 MG tablet Take 1 tablet (25 mg total) by mouth 3 (three) times daily. 270 tablet 3   No facility-administered medications prior to visit.     Final Medications at End of Visit    Current Meds  Medication Sig   aspirin EC 81 MG tablet Take 81 mg by mouth daily.   atorvastatin (LIPITOR) 20 MG tablet Take 1 tablet (20 mg total) by mouth every evening.   clonazePAM (KLONOPIN) 0.5 MG disintegrating tablet TAKE 1 TABLET BY MOUTH TWICE A DAY   clopidogrel (PLAVIX) 75 MG tablet Take 1 tablet (75 mg total) by mouth daily.   cromolyn (OPTICROM) 4 % ophthalmic solution Place 1 drop into both eyes every Wednesday.   [employer]S ACETAMINOPHEN 325 MG tablet TAKE 2 CAPS BY MOUTH EVERY 6 HOURS AS NEEDED FOR PAIN FOR TEMP 100F OR ABOVE   [employer]S SENNA PLUS 8.6-50 MG tablet TAKE 2 TABLETS BY MOUTH AT BEDTIME FOR CONSTIPATION   Dextromethorphan-GG-APAP (CORICIDIN HBP COLD/COUGH/FLU PO) Take 1 tablet by mouth at bedtime as needed.   ferrous sulfate 325 (65 FE) MG tablet Take 325 mg by mouth daily with breakfast.    furosemide (LASIX) 80 MG tablet Take 1.5 tablets (120 mg total) by mouth 2 (two)  times daily with breakfast and lunch.   isosorbide dinitrate (ISORDIL) 30 MG tablet Take 1 tablet (30 mg total) by mouth 2 (two) times daily.   metoprolol succinate (TOPROL-XL) 50 MG 24 hr tablet Take 3 tablets (150 mg total) by mouth daily.   multivitamin (RENA-VIT) TABS tablet Take 1 tablet by mouth at bedtime.   nitroGLYCERIN (NITROSTAT) 0.4 MG SL tablet Place 0.4 mg under the tongue every 5 (five) minutes as needed for chest pain.   oxyCODONE-acetaminophen (PERCOCET) 5-325 MG tablet Take 1 tablet by mouth every 4 (four) hours as needed for severe pain.   potassium chloride SA (KLOR-CON) 20 MEQ tablet Take 2 tablets (40 mEq total) by mouth daily for 15 days.   PREVIDENT 5000 BOOSTER PLUS 1.1 % PSTE Take 1 application by mouth every evening.   sertraline (ZOLOFT) 25 MG tablet TAKE 1 TABLET ONE TIME A DAY   silodosin (RAPAFLO) 8 MG CAPS capsule TAKE 1 CAPSULE BY MOUTH EVERYDAY AT BEDTIME   Vitamin D, Ergocalciferol, (DRISDOL) 1.25 MG (50000 UNIT) CAPS capsule Take 1 capsule (50,000 Units total) by mouth every 7 (seven) days.   [DISCONTINUED] hydrALAZINE (APRESOLINE) 25 MG tablet Take 1 tablet (25 mg total) by mouth 3 (three) times daily.    Radiology:  No results found.  Cardiac Studies:   Heart catheterization 2019-08-20: LM: Normal LAD: Ostial 95% stenosis Ramus: Ostial/prox 30% stenosis LCx: Ostial 75% stenosis RCA: Prox-mid diffuse 40% calcific disease. Distal RCA 70% ISR of 3.0 by 28 mm Promus placed 02/27/2014.  Pre-CABG Dopplers including ABI and carotid duplex: No significant carotid artery stenosis.  Antegrade vertebral artery flow. ABI normal, with triphasic waveforms at the level of the ankle.  Hx of CABG 08/15/2019: LIMA TO LAD, RIMA TO PDA, SEQUENTIAL RADIAL ARTERY TO Cx-OM1.  Echocardiogram 07/22/2020: Left ventricle cavity is normal in size. Mild concentric hypertrophy of the left ventricle.  Normal global wall motion. Normal LV systolic function with EF 67%. Normal diastolic filling pattern.  Trileaflet aortic valve with  Mild aortic valve leaflet calcification. Trace aortic valve stenosis. No regurgitation. Normal right atrial pressure.   EKG:    07/11/2020: Normal sinus rhythm at rate of 86 bpm, normal axis.  Incomplete right bundle branch block.  Poor R wave progression, anteroseptal infarct old.  Single PVC.  No significant change from 01/16/2020.   Assessment     ICD-10-CM   1. Primary hypertension  I10 CBC    Basic metabolic panel    2. Hypertension with heart disease  I11.9 hydrALAZINE (APRESOLINE) 25 MG tablet    3. Ischemic cardiomyopathy  I25.5 CBC    Basic metabolic panel    Brain natriuretic peptide      Meds ordered this encounter  Medications   hydrALAZINE (APRESOLINE) 25 MG tablet    Sig: Take 2 tablets (50 mg total) by mouth 3 (three) times daily.    Dispense:  270 tablet    Refill:  3   Medications Discontinued During This Encounter  Medication Reason   hydrALAZINE (APRESOLINE) 25 MG tablet     Orders Placed This Encounter  Procedures   CBC   Basic metabolic panel   Brain natriuretic peptide    Recommendations:   Christian [name]  is a 75 y.o. AA male with hypertension, hyperlipidemia, DM with stage 3 CKD,  CAD with  stenting to the PDA on 02/27/2014. Patient presented with unstable angina and NSTEMI on 08/09/2019 along with a very small subsegmental pulmonary embolism and discharged on 10/18/2019  after he underwent CABG x4 on 08/15/2019.    Patient hospitalized 10/03/2020 - 10/07/2020 with acute decompensated diastolic heart failure in the setting of CKD stage IV.  Patient was diuresed and discharged with Lasix 120 mg twice daily.  Patient's metoprolol was also reduced to 150 mg given acute decompensation of heart failure. He now presents for follow up.   Patient is feeling relatively well and there are no clinical signs of acute decompensated heart  failure at today's visit.  Will defer management of diuretics to nephrology who patient is seen in approximately 2 weeks.  In regard to hypertension, patient's blood pressure is elevated above goal at today's visit.  We will therefore increase hydralazine from 25 mg to 50 mg 3 times daily.  We will also obtain repeat CBC, BMP, and BNP as he has not had these done since discharge from the hospital.  Notably at present patient is not on dialysis and is following closely with nephrology to avoid having to resume dialysis.    Patient may keep follow-up appointment with Dr. Einar Gip in November for CAD, hypertension, PAD.  I personally reviewed external records, specifically from recent hospitalization.    Alethia Berthold, PA-C 10/15/2020, 6:52 AM Office: [phone]

## 2020-10-14 ENCOUNTER — Other Ambulatory Visit: Payer: Self-pay

## 2020-10-14 ENCOUNTER — Telehealth: Payer: Self-pay | Admitting: Pharmacist

## 2020-10-14 ENCOUNTER — Ambulatory Visit: Payer: Federal, State, Local not specified - PPO | Admitting: Student

## 2020-10-14 ENCOUNTER — Encounter: Payer: Self-pay | Admitting: Student

## 2020-10-14 VITALS — BP 142/84 | HR 85 | Temp 97.7°F | Ht 67.0 in | Wt 206.0 lb

## 2020-10-14 DIAGNOSIS — I119 Hypertensive heart disease without heart failure: Secondary | ICD-10-CM | POA: Diagnosis not present

## 2020-10-14 DIAGNOSIS — I255 Ischemic cardiomyopathy: Secondary | ICD-10-CM | POA: Diagnosis not present

## 2020-10-14 DIAGNOSIS — I10 Essential (primary) hypertension: Secondary | ICD-10-CM | POA: Diagnosis not present

## 2020-10-14 MED ORDER — HYDRALAZINE HCL 25 MG PO TABS: 50.0000 mg | ORAL_TABLET | Freq: Three times a day (TID) | ORAL | 3 refills | Status: DC

## 2020-10-14 NOTE — Chronic Care Management (AMB) (Signed)
Chronic Care Management Pharmacy Assistant   Name: Christian [name]  MRN: [id number] DOB: Jun 12, 1945   Reason for Encounter: Disease State   Conditions to be addressed/monitored: HTN   Recent office visits:  None ID  Recent consult visits:  09/13/20 Dr. Servando Snare, Vascular Surg 10/01/20 [facility] visits:  Medication Reconciliation was completed by comparing discharge summary, patient's EMR and Pharmacy list, and upon discussion with patient.  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 Discharge date was 09/18/20. Discharged from [facility].    10/03/20 due to pulmonary edema discharged on 10/07/20 from [facility]?Medications Started at [facility] Discharge:?? -started metoprolol 50 mg  3 tabs daily and potassium  Medication Changes at Hospital Discharge: -Changed None ID  Medications Discontinued at Hospital Discharge: -Stopped None ID   Medications that remain the same after Hospital Discharge:??  -All other medications will remain the same.    Medications: Outpatient Encounter Medications as of 10/14/2020  Medication Sig Note   aspirin EC 81 MG tablet Take 81 mg by mouth daily.    atorvastatin (LIPITOR) 20 MG tablet Take 1 tablet (20 mg total) by mouth every evening.    clonazePAM (KLONOPIN) 0.5 MG disintegrating tablet TAKE 1 TABLET BY MOUTH TWICE A DAY 10/04/2020: #60 filled [facility]/2020 [facility] per PMP AWARE   clopidogrel (PLAVIX) 75 MG tablet Take 1 tablet (75 mg total) by mouth daily.    cromolyn (OPTICROM) 4 % ophthalmic solution Place 1 drop into both eyes every Wednesday.    [facility] ACETAMINOPHEN 325 MG tablet TAKE 2 CAPS BY MOUTH EVERY 6 HOURS AS NEEDED FOR PAIN FOR TEMP 100F OR ABOVE    [facility] SENNA PLUS 8.6-50 MG tablet TAKE 2 TABLETS BY MOUTH AT BEDTIME FOR CONSTIPATION    Dextromethorphan-GG-APAP (CORICIDIN HBP COLD/COUGH/FLU PO) Take 1 tablet by mouth at bedtime as needed.    ferrous sulfate 325 (65 FE) MG  tablet Take 325 mg by mouth daily with breakfast.    furosemide (LASIX) 80 MG tablet Take 1.5 tablets (120 mg total) by mouth 2 (two) times daily with breakfast and lunch.    hydrALAZINE (APRESOLINE) 25 MG tablet Take 1 tablet (25 mg total) by mouth 3 (three) times daily.    isosorbide dinitrate (ISORDIL) 30 MG tablet Take 1 tablet (30 mg total) by mouth 2 (two) times daily.    metoprolol succinate (TOPROL-XL) 50 MG 24 hr tablet Take 3 tablets (150 mg total) by mouth daily.    multivitamin (RENA-VIT) TABS tablet Take 1 tablet by mouth at bedtime.    nitroGLYCERIN (NITROSTAT) 0.4 MG SL tablet Place 0.4 mg under the tongue every 5 (five) minutes as needed for chest pain.    oxyCODONE-acetaminophen (PERCOCET) 5-325 MG tablet Take 1 tablet by mouth every 4 (four) hours as needed for severe pain. 10/04/2020: #5 filled [facility]18[facility]2020 [facility] - 2 tablets left per wife   potassium chloride SA (KLOR-CON) 20 MEQ tablet Take 2 tablets (40 mEq total) by mouth daily for 15 days.    PREVIDENT 5000 BOOSTER PLUS 1.1 % PSTE Take 1 application by mouth every evening.    sertraline (ZOLOFT) 25 MG tablet TAKE 1 TABLET ONE TIME A DAY    silodosin (RAPAFLO) 8 MG CAPS capsule TAKE 1 CAPSULE BY MOUTH EVERYDAY AT BEDTIME    Vitamin D, Ergocalciferol, (DRISDOL) 1.25 MG (50000 UNIT) CAPS capsule Take 1 capsule (50,000 Units total) by mouth every 7 (seven) days.  No facility-administered encounter medications on file as of 10/14/2020.    Pharmacist Review Reviewed chart prior to disease state call. Spoke with patient regarding BP  Recent Office Vitals: BP Readings from Last 3 Encounters:  10/07/20 (!) 143/75  10/01/20 (!) 146/84  09/18/20 133/79   Pulse Readings from Last 3 Encounters:  10/07/20 94  10/01/20 83  09/18/20 80    Wt Readings from Last 3 Encounters:  10/07/20 205 lb 14.4 oz (93.4 kg)  10/01/20 216 lb 8 oz (98.2 kg)  09/18/20 206 lb (93.4 kg)     Kidney Function Lab Results  Component Value Date/Time    CREATININE 2.80 (H) 10/05/2020 01:37 AM   CREATININE 2.82 (H) 10/04/2020 02:23 AM   CREATININE 2.87 (H) 12/20/2019 01:12 PM   GFR 17.66 (L) 05/28/2020 03:38 PM   GFRNONAA 23 (L) 10/05/2020 01:37 AM   GFRAA 19 (L) 10/18/2019 12:46 AM    BMP Latest Ref Rng & Units 10/05/2020 10/04/2020 10/03/2020  Glucose 70 - 99 mg/dL 94 79 116(H)  BUN 8 - 23 mg/dL 45(H) 32(H) 32(H)  Creatinine 0.61 - 1.24 mg/dL 2.80(H) 2.82(H) 2.85(H)  BUN/Creat Ratio 6 - 22 (calc) - - -  Sodium 135 - 145 mmol/L 141 143 143  Potassium 3.5 - 5.1 mmol/L 3.3(L) 4.1 3.7  Chloride 98 - 111 mmol/L 106 105 110  CO2 22 - 32 mmol/L 24 25 21(L)  Calcium 8.9 - 10.3 mg/dL 8.9 9.3 9.1    Current antihypertensive regimen:  Metoprolol 50 mg 3 tabs daily Isosorbide 30 mg 1 tab bid How often are you checking your Blood Pressure? daily Current home BP readings: Patient states that lood presuure runs around 156/78. He does not log bp readings only what is recorded in blood pressure meter  What recent interventions/DTPs have been made by any provider to improve Blood Pressure control since last CPP Visit: Patient blood pressure med metoprolol was increased at hospital  Any recent hospitalizations or ED visits since last visit with CPP? Yes What diet changes have been made to improve Blood Pressure Control?  Patient states that he has not made any changes to diet  What exercise is being done to improve your Blood Pressure Control?  Patient states that the only exercise he was getting was at the cardio rehab which he no longer does  Patient states that he can't hold his urine, since he left the hospital he can use the bathroom and soon as he leaves turns right back around and use again. Also patient states that wife told him one of his providers said that he cannot drive. He would like to know if that is so and why, since he feels ok   Adherence Review: Is the patient currently on ACE/ARB medication? No Does the patient have >5 day  gap between last estimated fill dates? No   Star Rating Drugs: Atorvastatin 07/18/20 90 ds  Ethelene Hal Clinical Pharmacist Assistant [phone]   Time spent:40

## 2020-10-20 ENCOUNTER — Other Ambulatory Visit: Payer: Self-pay | Admitting: Internal Medicine

## 2020-10-21 ENCOUNTER — Ambulatory Visit (INDEPENDENT_AMBULATORY_CARE_PROVIDER_SITE_OTHER): Payer: Medicare Other | Admitting: Physician Assistant

## 2020-10-21 ENCOUNTER — Other Ambulatory Visit: Payer: Self-pay

## 2020-10-21 ENCOUNTER — Ambulatory Visit (HOSPITAL_COMMUNITY)
Admission: RE | Admit: 2020-10-21 | Discharge: 2020-10-21 | Disposition: A | Discharge status: Home / Self Care | Payer: Medicare Other | Source: Ambulatory Visit | Attending: Surgery | Admitting: Surgery

## 2020-10-21 VITALS — BP 121/68 | HR 91 | Temp 97.7°F | Resp 20 | Ht 67.0 in | Wt 206.8 lb

## 2020-10-21 DIAGNOSIS — N186 End stage renal disease: Secondary | ICD-10-CM | POA: Diagnosis not present

## 2020-10-21 DIAGNOSIS — N184 Chronic kidney disease, stage 4 (severe): Secondary | ICD-10-CM

## 2020-10-21 NOTE — H&P (View-Only) (Signed)
POST OPERATIVE OFFICE NOTE    CC:  F/u for surgery  HPI:  This is a 75 y.o. 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 Trula Slade.   Pt states he does not have pain/numbness in the left hand.  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  He is not having any more drainage.    Pt was seen back on 10/01/2020 for increased swelling and drainage from incision.  He did have some serosanguinous drainage but no evidence of infection.    He states that he was on dialysis after his heart surgery but they were able to stop this for now.   He did have a catheter but this was removed when he was taken off HD.  His creatinine on 10/05/2020 was 2.8.   He was hospitalized earlier this month for shortness of breath/heart failure.  Venous duplex negative for DVT.   The pt is here with his wife of 71 years.  He calls his fistula a fish and says we put a fish in his arm.   The pt is not on dialysis.  His nephrologist is Dr. Royce Macadamia.   He is on Plavix and aspirin.     No Known Allergies  Current Outpatient Medications  Medication Sig Dispense Refill   aspirin EC 81 MG tablet Take 81 mg by mouth daily.     atorvastatin (LIPITOR) 20 MG tablet Take 1 tablet (20 mg total) by mouth every evening. 90 tablet 3   clonazePAM (KLONOPIN) 0.5 MG disintegrating tablet TAKE 1 TABLET BY MOUTH TWICE A DAY 60 tablet 5   clopidogrel (PLAVIX) 75 MG tablet Take 1 tablet (75 mg total) by mouth daily. 90 tablet 0   cromolyn (OPTICROM) 4 % ophthalmic solution Place 1 drop into both eyes every Wednesday.     [facility] ACETAMINOPHEN 325 MG tablet TAKE 2 CAPS BY MOUTH EVERY 6 HOURS AS NEEDED FOR PAIN FOR TEMP 100F OR ABOVE 60 tablet 0   [facility] SENNA PLUS 8.6-50 MG tablet TAKE 2 TABLETS BY MOUTH AT BEDTIME FOR CONSTIPATION 60 tablet 1   Dextromethorphan-GG-APAP (CORICIDIN HBP COLD/COUGH/FLU PO) Take 1 tablet by mouth at bedtime as needed.      ferrous sulfate 325 (65 FE) MG tablet Take 325 mg by mouth daily with breakfast.     furosemide (LASIX) 80 MG tablet Take 1.5 tablets (120 mg total) by mouth 2 (two) times daily with breakfast and lunch. 90 tablet 0   hydrALAZINE (APRESOLINE) 25 MG tablet Take 2 tablets (50 mg total) by mouth 3 (three) times daily. 270 tablet 3   isosorbide dinitrate (ISORDIL) 30 MG tablet Take 1 tablet (30 mg total) by mouth 2 (two) times daily. 270 tablet 1   metoprolol succinate (TOPROL-XL) 50 MG 24 hr tablet Take 3 tablets (150 mg total) by mouth daily. 90 tablet 1   multivitamin (RENA-VIT) TABS tablet Take 1 tablet by mouth at bedtime.  0   nitroGLYCERIN (NITROSTAT) 0.4 MG SL tablet Place 0.4 mg under the tongue every 5 (five) minutes as needed for chest pain.     oxyCODONE-acetaminophen (PERCOCET) 5-325 MG tablet Take 1 tablet by mouth every 4 (four) hours as needed for severe pain. 5 tablet 0   potassium chloride SA (KLOR-CON) 20 MEQ tablet Take 2 tablets (40 mEq total) by mouth daily for 15 days. 30 tablet 0   PREVIDENT  5000 BOOSTER PLUS 1.1 % PSTE Take 1 application by mouth every evening.     sertraline (ZOLOFT) 25 MG tablet TAKE 1 TABLET ONE TIME A DAY 90 tablet 1   silodosin (RAPAFLO) 8 MG CAPS capsule TAKE 1 CAPSULE BY MOUTH EVERYDAY AT BEDTIME 90 capsule 3   Vitamin D, Ergocalciferol, (DRISDOL) 1.25 MG (50000 UNIT) CAPS capsule Take 1 capsule (50,000 Units total) by mouth every 7 (seven) days. 12 capsule 3   No current facility-administered medications for this visit.     ROS:  See HPI  Physical Exam:  Today's Vitals   10/21/20 1555  BP: 121/68  Pulse: 91  Resp: 20  Temp: 97.7 F (36.5 C)  TempSrc: Temporal  SpO2: 98%  Weight: 206 lb 12.8 oz (93.8 kg)  Height: 5\' 7"  (1.702 m)  PainSc: 0-No pain   Body mass index is 32.39 kg/m.  Incision:  healed nicely Extremities:   There is a palpable left ulnar pulse.   Motor and sensory are in tact.   There is a thrill/bruit present.   The fistula is easily palpable -bilateral hands are equally warm and hand grips equal bilaterally.   Dialysis Duplex on 10/21/2020: +------------+----------+-------------+----------+--------+  OUTFLOW VEINPSV (cm/s)Diameter (cm)Depth (cm)Describe  +------------+----------+-------------+----------+--------+  Shoulder       133        0.89        1.42             +------------+----------+-------------+----------+--------+  Prox UA        153        0.94        1.20             +------------+----------+-------------+----------+--------+  Mid UA         152        0.80        0.84    branch   +------------+----------+-------------+----------+--------+  Dist UA        272        0.88        0.88             +------------+----------+-------------+----------+--------+  AC Fossa       236        0.61        1.89             +------------+----------+-------------+----------+--------+    Assessment/Plan:  This is a 75 y.o. male who is s/p: left 1st stage BVT on 09/18/2020 by Dr. Trula Slade.   -the pt does not have evidence of steal. -his basilic vein incision has healed.  -the fistula has matured nicely and is ready for 2nd stage basilic vein transposition with Dr. Trula Slade.   Discussed this with pt and his wife.   -discussed with pt that access does not last forever and will need intervention or even new access at some point.  He and his wife expressed understanding.    Leontine Locket, [facility] [phone]  Clinic MD:  Trula Slade

## 2020-10-21 NOTE — Progress Notes (Signed)
POST OPERATIVE OFFICE NOTE    CC:  F/u for surgery  HPI:  This is a 75 y.o. 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 Trula Slade.   Pt states he does not have pain/numbness in the left hand.  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  He is not having any more drainage.    Pt was seen back on 10/01/2020 for increased swelling and drainage from incision.  He did have some serosanguinous drainage but no evidence of infection.    He states that he was on dialysis after his heart surgery but they were able to stop this for now.   He did have a catheter but this was removed when he was taken off HD.  His creatinine on 10/05/2020 was 2.8.   He was hospitalized earlier this month for shortness of breath/heart failure.  Venous duplex negative for DVT.   The pt is here with his wife of 74 years.  He calls his fistula a fish and says we put a fish in his arm.   The pt is not on dialysis.  His nephrologist is Dr. Royce Macadamia.   He is on Plavix and aspirin.     No Known Allergies  Current Outpatient Medications  Medication Sig Dispense Refill   aspirin EC 81 MG tablet Take 81 mg by mouth daily.     atorvastatin (LIPITOR) 20 MG tablet Take 1 tablet (20 mg total) by mouth every evening. 90 tablet 3   clonazePAM (KLONOPIN) 0.5 MG disintegrating tablet TAKE 1 TABLET BY MOUTH TWICE A DAY 60 tablet 5   clopidogrel (PLAVIX) 75 MG tablet Take 1 tablet (75 mg total) by mouth daily. 90 tablet 0   cromolyn (OPTICROM) 4 % ophthalmic solution Place 1 drop into both eyes every Wednesday.     [facility] ACETAMINOPHEN 325 MG tablet TAKE 2 CAPS BY MOUTH EVERY 6 HOURS AS NEEDED FOR PAIN FOR TEMP 100F OR ABOVE 60 tablet 0   [facility] SENNA PLUS 8.6-50 MG tablet TAKE 2 TABLETS BY MOUTH AT BEDTIME FOR CONSTIPATION 60 tablet 1   Dextromethorphan-GG-APAP (CORICIDIN HBP COLD/COUGH/FLU PO) Take 1 tablet by mouth at bedtime as needed.      ferrous sulfate 325 (65 FE) MG tablet Take 325 mg by mouth daily with breakfast.     furosemide (LASIX) 80 MG tablet Take 1.5 tablets (120 mg total) by mouth 2 (two) times daily with breakfast and lunch. 90 tablet 0   hydrALAZINE (APRESOLINE) 25 MG tablet Take 2 tablets (50 mg total) by mouth 3 (three) times daily. 270 tablet 3   isosorbide dinitrate (ISORDIL) 30 MG tablet Take 1 tablet (30 mg total) by mouth 2 (two) times daily. 270 tablet 1   metoprolol succinate (TOPROL-XL) 50 MG 24 hr tablet Take 3 tablets (150 mg total) by mouth daily. 90 tablet 1   multivitamin (RENA-VIT) TABS tablet Take 1 tablet by mouth at bedtime.  0   nitroGLYCERIN (NITROSTAT) 0.4 MG SL tablet Place 0.4 mg under the tongue every 5 (five) minutes as needed for chest pain.     oxyCODONE-acetaminophen (PERCOCET) 5-325 MG tablet Take 1 tablet by mouth every 4 (four) hours as needed for severe pain. 5 tablet 0   potassium chloride SA (KLOR-CON) 20 MEQ tablet Take 2 tablets (40 mEq total) by mouth daily for 15 days. 30 tablet 0   PREVIDENT  5000 BOOSTER PLUS 1.1 % PSTE Take 1 application by mouth every evening.     sertraline (ZOLOFT) 25 MG tablet TAKE 1 TABLET ONE TIME A DAY 90 tablet 1   silodosin (RAPAFLO) 8 MG CAPS capsule TAKE 1 CAPSULE BY MOUTH EVERYDAY AT BEDTIME 90 capsule 3   Vitamin D, Ergocalciferol, (DRISDOL) 1.25 MG (50000 UNIT) CAPS capsule Take 1 capsule (50,000 Units total) by mouth every 7 (seven) days. 12 capsule 3   No current facility-administered medications for this visit.     ROS:  See HPI  Physical Exam:  Today's Vitals   10/21/20 1555  BP: 121/68  Pulse: 91  Resp: 20  Temp: 97.7 F (36.5 C)  TempSrc: Temporal  SpO2: 98%  Weight: 206 lb 12.8 oz (93.8 kg)  Height: 5\' 7"  (1.702 m)  PainSc: 0-No pain   Body mass index is 32.39 kg/m.  Incision:  healed nicely Extremities:   There is a palpable left ulnar pulse.   Motor and sensory are in tact.   There is a thrill/bruit present.   The fistula is easily palpable -bilateral hands are equally warm and hand grips equal bilaterally.   Dialysis Duplex on 10/21/2020: +------------+----------+-------------+----------+--------+  OUTFLOW VEINPSV (cm/s)Diameter (cm)Depth (cm)Describe  +------------+----------+-------------+----------+--------+  Shoulder       133        0.89        1.42             +------------+----------+-------------+----------+--------+  Prox UA        153        0.94        1.20             +------------+----------+-------------+----------+--------+  Mid UA         152        0.80        0.84    branch   +------------+----------+-------------+----------+--------+  Dist UA        272        0.88        0.88             +------------+----------+-------------+----------+--------+  AC Fossa       236        0.61        1.89             +------------+----------+-------------+----------+--------+    Assessment/Plan:  This is a 75 y.o. male who is s/p: left 1st stage BVT on 09/18/2020 by Dr. Trula Slade.   -the pt does not have evidence of steal. -his basilic vein incision has healed.  -the fistula has matured nicely and is ready for 2nd stage basilic vein transposition with Dr. Trula Slade.   Discussed this with pt and his wife.   -discussed with pt that access does not last forever and will need intervention or even new access at some point.  He and his wife expressed understanding.    Leontine Locket, [facility] [phone]  Clinic MD:  Trula Slade

## 2020-10-22 ENCOUNTER — Other Ambulatory Visit: Payer: Self-pay

## 2020-10-23 DIAGNOSIS — I10 Essential (primary) hypertension: Secondary | ICD-10-CM | POA: Diagnosis not present

## 2020-10-23 DIAGNOSIS — I255 Ischemic cardiomyopathy: Secondary | ICD-10-CM | POA: Diagnosis not present

## 2020-10-23 DIAGNOSIS — R0602 Shortness of breath: Secondary | ICD-10-CM | POA: Diagnosis not present

## 2020-10-24 LAB — BASIC METABOLIC PANEL
BUN/Creatinine Ratio: 16 (ref 10–24)
BUN: 47 mg/dL — ABNORMAL HIGH (ref 8–27)
CO2: 19 mmol/L — ABNORMAL LOW (ref 20–29)
Calcium: 9.1 mg/dL (ref 8.6–10.2)
Chloride: 109 mmol/L — ABNORMAL HIGH (ref 96–106)
Creatinine, Ser: 2.87 mg/dL — ABNORMAL HIGH (ref 0.76–1.27)
Glucose: 156 mg/dL — ABNORMAL HIGH (ref 65–99)
Potassium: 4.3 mmol/L (ref 3.5–5.2)
Sodium: 145 mmol/L — ABNORMAL HIGH (ref 134–144)
eGFR: 22 mL/min/{1.73_m2} — ABNORMAL LOW (ref >59)

## 2020-10-24 LAB — CBC
Hematocrit: 33.5 % — ABNORMAL LOW (ref 37.5–51.0)
Hemoglobin: 11.3 g/dL — ABNORMAL LOW (ref 13.0–17.7)
MCH: 33.1 pg — ABNORMAL HIGH (ref 26.6–33.0)
MCHC: 33.7 g/dL (ref 31.5–35.7)
MCV: 98 fL — ABNORMAL HIGH (ref 79–97)
Platelets: 204 10*3/uL (ref 150–450)
RBC: 3.41 x10E6/uL — ABNORMAL LOW (ref 4.14–5.80)
RDW: 13.4 % (ref 11.6–15.4)
WBC: 8.8 10*3/uL (ref 3.4–10.8)

## 2020-10-24 LAB — BRAIN NATRIURETIC PEPTIDE: BNP: 83.2 pg/mL (ref 0.0–100.0)

## 2020-10-25 ENCOUNTER — Telehealth: Payer: Self-pay

## 2020-10-25 DIAGNOSIS — N2581 Secondary hyperparathyroidism of renal origin: Secondary | ICD-10-CM | POA: Diagnosis not present

## 2020-10-25 DIAGNOSIS — I129 Hypertensive chronic kidney disease with stage 1 through stage 4 chronic kidney disease, or unspecified chronic kidney disease: Secondary | ICD-10-CM | POA: Diagnosis not present

## 2020-10-25 DIAGNOSIS — I5032 Chronic diastolic (congestive) heart failure: Secondary | ICD-10-CM | POA: Diagnosis not present

## 2020-10-25 DIAGNOSIS — E876 Hypokalemia: Secondary | ICD-10-CM | POA: Diagnosis not present

## 2020-10-25 DIAGNOSIS — E1122 Type 2 diabetes mellitus with diabetic chronic kidney disease: Secondary | ICD-10-CM | POA: Diagnosis not present

## 2020-10-25 DIAGNOSIS — I2510 Atherosclerotic heart disease of native coronary artery without angina pectoris: Secondary | ICD-10-CM | POA: Diagnosis not present

## 2020-10-25 DIAGNOSIS — N184 Chronic kidney disease, stage 4 (severe): Secondary | ICD-10-CM | POA: Diagnosis not present

## 2020-10-25 DIAGNOSIS — D631 Anemia in chronic kidney disease: Secondary | ICD-10-CM | POA: Diagnosis not present

## 2020-10-25 NOTE — Progress Notes (Signed)
Please notify patient BNP is normal, BMP is stable, and CBC improving.   Please forward results to pt's nephrologist as well.

## 2020-10-25 NOTE — Telephone Encounter (Signed)
Spoke with pt and updated on arrival time change of 0700 AM for surgery on 10/31/20 at [facility]. Pt voiced understanding.

## 2020-10-25 NOTE — Progress Notes (Signed)
Called and spoke with patient regarding his lab results. Patient wants labs to be faxed to Dr. Pricilla Holm and Dr. Royce Macadamia at [facility].

## 2020-10-27 ENCOUNTER — Other Ambulatory Visit: Payer: Self-pay

## 2020-10-27 ENCOUNTER — Encounter (HOSPITAL_COMMUNITY): Payer: Self-pay | Admitting: Surgery

## 2020-10-27 NOTE — Progress Notes (Signed)
SDW CALL  Patient was given pre-op instructions over the phone. The opportunity was given for the patient to ask questions. No further questions asked. Patient verbalized understanding of instructions given.   PCP -  Cardiologist - Einar Gip  PPM/ICD -  Device Orders -  Rep Notified -   Chest x-ray - 10/06/20 EKG - 10/04/20 Stress Test -  ECHO - 10/04/20 Cardiac Cath - 2021   Blood Thinner Instructions: stopping plavix 5 days prior  Aspirin Instructions: continuing   COVID TEST- ambulatory posting   Anesthesia review: yes, allison note 09/17/20  Patient denies shortness of breath, fever, cough and chest pain over the phone call   All instructions explained to the patient, with a verbal understanding of the material. Patient agrees to go over the instructions while at home for a better understanding. Patient also instructed to self quarantine after being tested for COVID-19. The opportunity to ask questions was provided.

## 2020-10-29 NOTE — Progress Notes (Signed)
Anesthesia Chart Review:  Case: [id number] Date/Time: 10/31/20 0901   Procedure: LEFT SECOND STAGE BASCILIC VEIN TRANSPOSITION (Left)   Anesthesia type: Choice   Pre-op diagnosis: CKD 4   Location: [facility] OR ROOM 11 / [place] [facility]   Surgeons: Serafina Mitchell, MD       DISCUSSION: Patient is a 75 year old male scheduled for the above procedure. S/p first stage left basilic vein transposition 09/18/20.    History includes former smoker (quit 04/27/73), HTN, DM2, CAD (DES RCA 02/27/14; s/p NSTEMI with small RLL PE 08/09/19 with acute systolic CHF with EF 11-94% & 3VCAD 08/09/19, s/p CABG 08/15/19: LIMA-LAD, pedicled RIMA-PDA, left RA-OM1-distal OM branches of LCX, sternum left open due to swelling and RV compression; post-op aflutter, s/p beside cardioversion 08/17/19; cardiac arrest 08/18/19 requiring re-intubation and opening of chest in ICU with cardiac massage with dense clot evacuated from behind the RV; s/p mutliple sternal washouts followed by right pectoralis muscle advancement flap, layered closure of sternal wound, wound VAC 1/74/08 complicated by delayed hematoma in setting of heparin s/p mediastinal exploration and hematoma evacuation 09/14/19; also had AKI with underlying CKD requiring CRRT->HD, required open tracheostomy 08/29/19 decannulated 10/05/19; possible small right brain CVA with left sided weakness 09/07/19 unable to do MRI with no visible infarct on CT; discharged to [facility] 10/18/19), CKD (AKI post-CABG, requiring CRRT->HD, off 02/01/20), HLD, hiatal hernia, BPH (s/p TURP 04/04/12, 09/27/14), exertional dyspnea.   - Christian [name] admission 10/03/20-10/07/20 for acute on chronic diastolic CHF in setting of CKD stage IV with anticipated need for future hemodialysis. Initially also treated for PNA, but later felt likely findings related to vascular congestion. Nephrology increased his Lasix to improve diuresis. LVEF normalized by last echo 06/2020.  Last evaluation by cardiologist Dr. Einar Gip on 08/29/20. LVEF recovered on  06/2020 echo. HR and BP well controlled after increased b-blocker. No clinical evidence of heart failure and no recurrent CP. Six month follow-up planned. He had been on DAPT since CABG, but now he felt it was okay for patient to discontinue Plavix. (Plavix as listed as currently being on hold.)  He is a same day work-up. Anesthesia team to evaluate on the day of surgery.    VS:  BP Readings from Last 3 Encounters:  10/21/20 121/68  10/14/20 (!) 142/84  10/07/20 (!) 143/75   Pulse Readings from Last 3 Encounters:  10/21/20 91  10/14/20 85  10/07/20 94     PROVIDERS: Hoyt Koch, MD is PCP Adrian Prows, MD is cardiologist Fredrich Romans, MD is CT surgeon Harrie Jeans, MD is nephrologist   LABS: Labs as of 10/23/20 show Cr 2.87, glucose 156, H/H 11.3/33.5, PLT 204. A1c 5.4% 03/25/20.    Spirometry 08/11/19:   Ref. Range 08/11/2019 13:59  FVC-Pre Latest Units: L 1.83  FVC-%Pred-Pre Latest Units: % 50  FEV1-Pre Latest Units: L 1.48  FEV1-%Pred-Pre Latest Units: % 54  Pre FEV1/FVC ratio Latest Units: % 81  FEV1FVC-%Pred-Pre Latest Units: % 106  FEF 25-75 Pre Latest Units: L/sec 1.53  FEF2575-%Pred-Pre Latest Units: % 66  FEV6-Pre Latest Units: L 1.81  FEV6-%Pred-Pre Latest Units: % 52  Pre FEV6/FVC Ratio Latest Units: % 99  FEV6FVC-%Pred-Pre Latest Units: % 103    IMAGES: CXR 10/06/20:  FINDINGS: - Cardiomediastinal silhouette unchanged in size and contour with surgical changes of median sternotomy and CABG. - Similar appearance of opacity at the left lung base obscuring the left hemidiaphragm, the left heart border, with pleuroparenchymal thickening at the lateral left chest. This appears  slightly better than the comparison. Aeration of the right lung relatively maintained IMPRESSION: - Slight improvement in the left basilar opacity, likely a combination of pleural fluid, atelectasis/consolidation. - Surgical changes of median sternotomy and CABG   EKG: EKG  10/04/20: Normal sinus rhythm Poor R wave progression Abnormal ECG No significant change since last tracing Confirmed by Glori Bickers [phone]) on 10/05/2020 10:55:10 AM  EKG 07/11/20: Normal sinus rhythm at rate of 86 bpm, normal axis.  Incomplete right bundle branch block.  Poor R wave progression, anteroseptal infarct old.  Single PVC.  No significant change from 01/16/2020.      CV: Echocardiogram 07/22/2020: Left ventricle cavity is normal in size. Mild concentric hypertrophy of the left ventricle. Normal global wall motion. Normal LV systolic function with EF 67%. Normal diastolic filling pattern. Trileaflet aortic valve with  Mild aortic valve leaflet calcification. Trace aortic valve stenosis. No regurgitation. Normal right atrial pressure. - Comparison 08/10/19 in setting of NSTEMI/PE LVEF 30-35%, diffuse LV hypokinesis, severe hypokinesis of entire anterior and septal wall, grade II DD, RVSP 14.1 mmHg, normal PASP; 08/19/19: LVEF 45-50%, LV mildly decreased function, grade II DD     Pre-CABG Dopplers including ABI and carotid duplex 08/11/2019: No significant carotid artery stenosis (1-39% bilateral ICA stenosis).  Antegrade vertebral artery flow. Normal flow hemodynamics seen in bilateral [place] arteries. ABI normal, with triphasic waveforms at the level of the ankle.     Heart catheterization 08/10/2019: PRE-CABG LM: Normal LAD: Ostial 95% stenosis Ramus: Ostial/prox 30% stenosis LCx: Ostial 75% stenosis RCA: Prox-mid diffuse 40% calcific disease. Distal RCA 70% ISR of 3.0 by 28 mm Promus placed 02/27/2014. - S/p CABG 08/15/19: LIMA-LAD, pedicled RIMA-PDA, left RA-OM1-distal OM branches of LCx   Past Medical History:  Diagnosis Date   Anemia    low iron   BPH (benign prostatic hypertrophy)    CHF (congestive heart failure) (HCC)    Chronic kidney disease    Coronary artery disease    Dry eyes left   Hiatal hernia    Hx of CABG 08/15/2019: x 4 using bilateral IMAs and left  radial artery .  LIMA TO LAD, RIMA TO PDA, RADIAL ARTERY TO CIRC AND SEQUENTIALLY TO OM1. 08/15/2019   Hyperlipidemia    Hypertension    Incomplete bladder emptying    Myocardial infarction ([place]) 08/09/2019   Nocturia    PE (pulmonary thromboembolism) ([place]) 08/09/2019   small RLL PE 08/09/19   Pneumonia    as a child   Pre-diabetes    Problems with swallowing pt states test at [facility] approx 2012 shows a gastric valve  dysfunction--  eats small bites and drink liquids slowly   SOB (shortness of breath) on exertion     Past Surgical History:  Procedure Laterality Date   APPLICATION OF WOUND VAC N/A 08/24/2019   Procedure: APPLICATION OF WOUND VAC;  Surgeon: [name], MD;  Location: [place];  Service: Thoracic;  Laterality: N/A;   APPLICATION OF WOUND VAC  08/29/2019   Procedure: Wound Vac change;  Surgeon: [name], MD;  Location: [place] OR;  Service: Open Heart Surgery;;   APPLICATION OF WOUND VAC N/A 09/04/2019   Procedure: Application Of Wound Vac;  Surgeon: Grace Averi, MD;  Location: [place];  Service: Open Heart Surgery;  Laterality: N/A;   APPLICATION OF WOUND VAC N/A 09/06/2019   Procedure: APPLICATION OF ACELL, APPLICATION OF WOUND VAC USING PREVENA INCISIONAL  DRESSING;  Surgeon: Wallace Going, DO;  Location:  [place] OR;  Service: Plastics;  Laterality: N/A;   AV FISTULA PLACEMENT Left 09/18/2020   Procedure: ARTERIOVENOUS (AV) FISTULA CREATION LEFT VERSUS GRAFT;  Surgeon: Serafina Mitchell, MD;  Location: [place];  Service: Vascular;  Laterality: Left;   CARDIAC CATHETERIZATION     CORONARY ARTERY BYPASS GRAFT N/A 08/15/2019   Procedure: CORONARY ARTERY BYPASS GRAFTING (CABG), x 4 using bilateral IMAs and left radial artery .  LIMA TO LAD, RIMA TO PDA, RADIAL ARTERY TO CIRC AND SEQUENTIALLY TO OM1.;  Surgeon: [name], MD;  Location: [place];  Service: Open Heart Surgery;  Laterality: N/A;   CORONARY STENT PLACEMENT  02/27/2014   distal rt/pd coronary       dr  Einar Gip   CYSTO/ BLADDER BIOPSY'S/ CAUTHERIZATION  01-14-2004  DR Gaynelle Arabian   EXPLORATION POST OPERATIVE OPEN HEART N/A 08/16/2019   Procedure: Chest Closure S?P CABG WITH APPLICATION OF PREVENA  INCISIONAL WOUND VAC;  Surgeon: [name], MD;  Location: [place];  Service: Open Heart Surgery;  Laterality: N/A;   EXPLORATION POST OPERATIVE OPEN HEART N/A 08/21/2019   Procedure: CHEST WASHOUT S/P OPEN CHEST;  Surgeon: [name], MD;  Location: [place];  Service: Open Heart Surgery;  Laterality: N/A;  Open chest with Esmark dressing with Ioban sealant coverage.   EXPLORATION POST OPERATIVE OPEN HEART N/A 08/18/2019   Procedure: EXPLORATION POST OPERATIVE OPEN HEART (performed 04/23 on unit);  Surgeon: [name], MD;  Location: [place];  Service: Open Heart Surgery;  Laterality: N/A;   EXPLORATION POST OPERATIVE OPEN HEART N/A 08/24/2019   Procedure: CHEST WASHOUT POST OPERATIVE OPEN HEART;  Surgeon: [name], MD;  Location: [place];  Service: Open Heart Surgery;  Laterality: N/A;   EXPLORATION POST OPERATIVE OPEN HEART N/A 08/29/2019   Procedure: CHEST WOUND WASHOUT POST OPERATIVE OPEN HEART;  Surgeon: [name], MD;  Location: [place];  Service: Open Heart Surgery;  Laterality: N/A;   EXPLORATION POST OPERATIVE OPEN HEART N/A 09/04/2019   Procedure: MEDIASTINAL EXPLORATION WITH STERNAL WOUND IRRIGATION;  Surgeon: Grace Arush, MD;  Location: [place];  Service: Open Heart Surgery;  Laterality: N/A;   EXPLORATION POST OPERATIVE OPEN HEART N/A 09/14/2019   Procedure: EVACUATION OF HEMATOMA;  Surgeon: Grace Squire, MD;  Location: [place];  Service: Open Heart Surgery;  Laterality: N/A;   IR FLUORO GUIDE CV LINE LEFT  09/19/2019   IR GASTROSTOMY TUBE MOD SED  10/04/2019   IR GASTROSTOMY TUBE REMOVAL  11/29/2019   IR PATIENT EVAL TECH 0-60 MINS  09/29/2019   IR US GUIDE VASC ACCESS LEFT  09/19/2019   LAPAROSCOPIC LYSIS OF ADHESIONS N/A 09/06/2019   Procedure: LAPAROSCOPIC OMENTAL  HARVEST;  Surgeon: Mickeal Skinner, MD;  Location: [place];  Service: General;  Laterality: N/A;   LEFT HEART CATH AND CORONARY ANGIOGRAPHY N/A 08/10/2019   Procedure: LEFT HEART CATH AND CORONARY ANGIOGRAPHY;  Surgeon: Nigel Mormon, MD;  Location: [place] CV LAB;  Service: Cardiovascular;  Laterality: N/A;   LEFT HEART CATHETERIZATION WITH CORONARY ANGIOGRAM N/A 02/27/2014   Procedure: LEFT HEART CATHETERIZATION WITH CORONARY ANGIOGRAM;  Surgeon: Laverda Page, MD;  Location: [facility] CATH LAB;  Service: Cardiovascular;  Laterality: N/A;   MEDIASTINAL EXPLORATION N/A 09/06/2019   Procedure: MEDIASTINAL EXPLORATION;  Surgeon: Grace Tajh, MD;  Location: [place];  Service: Thoracic;  Laterality: N/A;   PECTORALIS FLAP  09/06/2019   Procedure: Pectoralis ADVANCEMENT Flap;  Surgeon: Audelia Hives  S, DO;  Location: [place];  Service: Plastics;;   PERCUTANEOUS CORONARY STENT INTERVENTION (PCI-S)  02/27/2014   Procedure: PERCUTANEOUS CORONARY STENT INTERVENTION (PCI-S);  Surgeon: Laverda Page, MD;  Location: [facility] LAB;  Service: Cardiovascular;;  rt PDA  3.0/28mm Promus stent   RADIAL ARTERY HARVEST Left 08/15/2019   Procedure: Radial Artery Harvest;  Surgeon: [name], MD;  Location: [place];  Service: Open Heart Surgery;  Laterality: Left;   RIB PLATING N/A 09/06/2019   Procedure: STERNAL PLATING;  Surgeon: Grace Kendarius, MD;  Location: [place];  Service: Thoracic;  Laterality: N/A;   TEE WITHOUT CARDIOVERSION N/A 08/15/2019   Procedure: TRANSESOPHAGEAL ECHOCARDIOGRAM (TEE);  Surgeon: [name], MD;  Location: [place];  Service: Open Heart Surgery;  Laterality: N/A;   TRACHEOSTOMY TUBE PLACEMENT  08/29/2019   Procedure: Tracheostomy;  Surgeon: [name], MD;  [facility];  Service: Open Heart Surgery; Decannulated 6/10/20211   TRANSURETHRAL RESECTION OF PROSTATE  04/04/2012   Procedure: TRANSURETHRAL RESECTION OF THE PROSTATE WITH GYRUS INSTRUMENTS;   Surgeon: Ailene Rud, MD;  Location[facility];  Service: Urology;  Laterality: N/A;   TRANSURETHRAL RESECTION OF PROSTATE N/A 09/27/2014   Procedure: TRANSURETHRAL RESECTION OF THE PROSTATE ;  Surgeon: Carolan Clines, MD;  Location: [facility];  Service: Urology;  Laterality: N/A;   UPPER GASTROINTESTINAL ENDOSCOPY      MEDICATIONS: No current facility-administered medications for this encounter.    aspirin EC 81 MG tablet   atorvastatin (LIPITOR) 20 MG tablet   clonazePAM (KLONOPIN) 0.5 MG disintegrating tablet   clopidogrel (PLAVIX) 75 MG tablet   cromolyn (OPTICROM) 4 % ophthalmic solution   CVS ACETAMINOPHEN 325 MG tablet   CVS SENNA PLUS 8.6-50 MG tablet   Dextromethorphan-GG-APAP (CORICIDIN HBP COLD/COUGH/FLU PO)   ferrous sulfate 325 (65 FE) MG tablet   furosemide (LASIX) 80 MG tablet   hydrALAZINE (APRESOLINE) 25 MG tablet   isosorbide dinitrate (ISORDIL) 30 MG tablet   metoprolol succinate (TOPROL-XL) 50 MG 24 hr tablet   multivitamin (RENA-VIT) TABS tablet   nitroGLYCERIN (NITROSTAT) 0.4 MG SL tablet   oxyCODONE-acetaminophen (PERCOCET) 5-325 MG tablet   potassium chloride SA (KLOR-CON) 20 MEQ tablet   PREVIDENT 5000 BOOSTER PLUS 1.1 % PSTE   sertraline (ZOLOFT) 25 MG tablet   silodosin (RAPAFLO) 8 MG CAPS capsule   Vitamin D, Ergocalciferol, (DRISDOL) 1.25 MG (50000 UNIT) CAPS capsule    Myra Gianotti, PA-C Surgical Short Stay/Anesthesiology [facility] Phone [phone] [facility] Phone [phone] 10/29/2020 12:55 PM

## 2020-10-29 NOTE — Anesthesia Preprocedure Evaluation (Addendum)
Anesthesia Evaluation  Patient identified by MRN, date of birth, ID band Patient awake    Reviewed: Allergy & Precautions, NPO status , Patient's Chart, lab work & pertinent test results, reviewed documented beta blocker date and time   Airway Mallampati: IV  TM Distance: >3 FB Neck ROM: Full  Mouth opening: Limited Mouth Opening  Dental no notable dental hx. (+) Teeth Intact, Dental Advisory Given   Pulmonary former smoker, PE   Pulmonary exam normal breath sounds clear to auscultation       Cardiovascular hypertension, Pt. on home beta blockers and Pt. on medications + angina + CAD, + Past MI, + Cardiac Stents, + CABG (4v CABG 2021) and +CHF  Normal cardiovascular exam Rhythm:Regular Rate:Normal  CV: Echocardiogram 07/22/2020: Left ventricle cavity is normal in size. Mild concentric hypertrophy of the left ventricle. Normal global wall motion. Normal LV systolic function with EF 67%. Normal diastolic filling pattern. Trileaflet aortic valve with Mild aortic valve leaflet calcification. Trace aortic valve stenosis. No regurgitation. Normal right atrial pressure. - Comparison 08/10/19 in setting of NSTEMI/PE LVEF 30-35%, diffuse LV hypokinesis, severe hypokinesis of entire anterior and septal wall, grade II DD, RVSP 14.1 mmHg, normal PASP; 08/19/19: LVEF 45-50%, LV mildly decreased function, grade II DD   Pre-CABG Dopplers including ABI and carotid duplex4/16/2021: No significant carotid artery stenosis(1-39% bilateral ICA stenosis). Antegrade vertebral artery flow.Normal flow hemodynamics seen in bilateral [place] arteries. ABI normal, with triphasic waveforms at the level of the ankle.   Heart catheterization4/15/2021:PRE-CABG LM: Normal LAD: Ostial 95% stenosis Ramus: Ostial/prox 30% stenosis LCx: Ostial 75% stenosis RCA: Prox-mid diffuse 40% calcific disease.Distal RCA 70% ISR of 3.0 by 28 mm Promus placed 02/27/2014. -  S/p CABG 08/15/19: LIMA-LAD, pedicled RIMA-PDA, left RA-OM1-distal OM branches of LCx   Neuro/Psych PSYCHIATRIC DISORDERS Anxiety MS negative neurological ROS     GI/Hepatic Neg liver ROS, hiatal hernia,   Endo/Other  diabetes, Type 2  Renal/GU Renal InsufficiencyRenal disease  negative genitourinary   Musculoskeletal negative musculoskeletal ROS (+)   Abdominal   Peds  Hematology  (+) Blood dyscrasia (on plavix), ,   Anesthesia Other Findings   Reproductive/Obstetrics                           Anesthesia Physical Anesthesia Plan  ASA: 3  Anesthesia Plan: General   Post-op Pain Management:    Induction: Intravenous  PONV Risk Score and Plan: 2 and Ondansetron and Dexamethasone  Airway Management Planned: LMA  Additional Equipment:   Intra-op Plan:   Post-operative Plan: Extubation in OR  Informed Consent: I have reviewed the patients History and Physical, chart, labs and discussed the procedure including the risks, benefits and alternatives for the proposed anesthesia with the patient or authorized representative who has indicated his/her understanding and acceptance.     Dental advisory given  Plan Discussed with: CRNA  Anesthesia Plan Comments: (PAT note written 10/29/2020 by Myra Gianotti, PA-C. )       Anesthesia Quick Evaluation

## 2020-10-30 ENCOUNTER — Other Ambulatory Visit: Payer: Self-pay | Admitting: Cardiology

## 2020-10-31 ENCOUNTER — Encounter (HOSPITAL_COMMUNITY)
Admission: RE | Disposition: A | Discharge status: Home / Self Care | Payer: Self-pay | Source: Home / Self Care | Attending: Surgery

## 2020-10-31 ENCOUNTER — Ambulatory Visit (HOSPITAL_COMMUNITY)
Admission: RE | Admit: 2020-10-31 | Discharge: 2020-10-31 | Disposition: A | Discharge status: Home / Self Care | Payer: Medicare Other | Attending: Surgery | Admitting: Surgery

## 2020-10-31 ENCOUNTER — Encounter (HOSPITAL_COMMUNITY): Payer: Self-pay | Admitting: Surgery

## 2020-10-31 ENCOUNTER — Ambulatory Visit (HOSPITAL_COMMUNITY): Payer: Medicare Other | Admitting: Physician Assistant

## 2020-10-31 ENCOUNTER — Other Ambulatory Visit: Payer: Self-pay

## 2020-10-31 DIAGNOSIS — I509 Heart failure, unspecified: Secondary | ICD-10-CM | POA: Insufficient documentation

## 2020-10-31 DIAGNOSIS — N185 Chronic kidney disease, stage 5: Secondary | ICD-10-CM | POA: Diagnosis not present

## 2020-10-31 DIAGNOSIS — E785 Hyperlipidemia, unspecified: Secondary | ICD-10-CM | POA: Insufficient documentation

## 2020-10-31 DIAGNOSIS — E1122 Type 2 diabetes mellitus with diabetic chronic kidney disease: Secondary | ICD-10-CM | POA: Insufficient documentation

## 2020-10-31 DIAGNOSIS — I130 Hypertensive heart and chronic kidney disease with heart failure and stage 1 through stage 4 chronic kidney disease, or unspecified chronic kidney disease: Secondary | ICD-10-CM | POA: Insufficient documentation

## 2020-10-31 DIAGNOSIS — Z7982 Long term (current) use of aspirin: Secondary | ICD-10-CM | POA: Insufficient documentation

## 2020-10-31 DIAGNOSIS — Z79899 Other long term (current) drug therapy: Secondary | ICD-10-CM | POA: Diagnosis not present

## 2020-10-31 DIAGNOSIS — Z7902 Long term (current) use of antithrombotics/antiplatelets: Secondary | ICD-10-CM | POA: Diagnosis not present

## 2020-10-31 DIAGNOSIS — I252 Old myocardial infarction: Secondary | ICD-10-CM | POA: Diagnosis not present

## 2020-10-31 DIAGNOSIS — I2510 Atherosclerotic heart disease of native coronary artery without angina pectoris: Secondary | ICD-10-CM | POA: Diagnosis not present

## 2020-10-31 DIAGNOSIS — D631 Anemia in chronic kidney disease: Secondary | ICD-10-CM | POA: Diagnosis not present

## 2020-10-31 DIAGNOSIS — Z951 Presence of aortocoronary bypass graft: Secondary | ICD-10-CM | POA: Diagnosis not present

## 2020-10-31 DIAGNOSIS — N184 Chronic kidney disease, stage 4 (severe): Secondary | ICD-10-CM | POA: Insufficient documentation

## 2020-10-31 DIAGNOSIS — Z955 Presence of coronary angioplasty implant and graft: Secondary | ICD-10-CM | POA: Diagnosis not present

## 2020-10-31 DIAGNOSIS — Z86711 Personal history of pulmonary embolism: Secondary | ICD-10-CM | POA: Diagnosis not present

## 2020-10-31 DIAGNOSIS — I25118 Atherosclerotic heart disease of native coronary artery with other forms of angina pectoris: Secondary | ICD-10-CM

## 2020-10-31 HISTORY — PX: BASCILIC VEIN TRANSPOSITION: SHX5742

## 2020-10-31 LAB — POCT I-STAT, CHEM 8
BUN: 48 mg/dL — ABNORMAL HIGH (ref 8–23)
Calcium, Ion: 1.15 mmol/L (ref 1.15–1.40)
Chloride: 110 mmol/L (ref 98–111)
Creatinine, Ser: 2.8 mg/dL — ABNORMAL HIGH (ref 0.61–1.24)
Glucose, Bld: 102 mg/dL — ABNORMAL HIGH (ref 70–99)
HCT: 33 % — ABNORMAL LOW (ref 39.0–52.0)
Hemoglobin: 11.2 g/dL — ABNORMAL LOW (ref 13.0–17.0)
Potassium: 3.4 mmol/L — ABNORMAL LOW (ref 3.5–5.1)
Sodium: 143 mmol/L (ref 135–145)
TCO2: 23 mmol/L (ref 22–32)

## 2020-10-31 LAB — GLUCOSE, CAPILLARY
Glucose-Capillary: 94 mg/dL (ref 70–99)
Glucose-Capillary: 94 mg/dL (ref 70–99)
Glucose-Capillary: 101 mg/dL — ABNORMAL HIGH (ref 70–99)

## 2020-10-31 SURGERY — TRANSPOSITION, VEIN, BASILIC
Anesthesia: General | Laterality: Left

## 2020-10-31 MED ORDER — ACETAMINOPHEN 500 MG PO TABS
1000.0000 mg | ORAL_TABLET | Freq: Once | ORAL | Status: AC
  Administered 2020-10-31: 1000 mg via ORAL
  Filled 2020-10-31: qty 2

## 2020-10-31 MED ORDER — PHENYLEPHRINE 40 MCG/ML (10ML) SYRINGE FOR IV PUSH (FOR BLOOD PRESSURE SUPPORT)
PREFILLED_SYRINGE | INTRAVENOUS | Status: AC
  Filled 2020-10-31: qty 10

## 2020-10-31 MED ORDER — FENTANYL CITRATE (PF) 250 MCG/5ML IJ SOLN
INTRAMUSCULAR | Status: DC | PRN
  Administered 2020-10-31 (×2): 25 ug via INTRAVENOUS

## 2020-10-31 MED ORDER — HEPARIN 6000 UNIT IRRIGATION SOLUTION
Status: AC
  Filled 2020-10-31: qty 500

## 2020-10-31 MED ORDER — BUPIVACAINE LIPOSOME 1.3 % IJ SUSP
INTRAMUSCULAR | Status: DC | PRN
  Administered 2020-10-31: 50 mL

## 2020-10-31 MED ORDER — ONDANSETRON HCL 4 MG/2ML IJ SOLN
INTRAMUSCULAR | Status: AC
  Filled 2020-10-31: qty 2

## 2020-10-31 MED ORDER — FENTANYL CITRATE (PF) 100 MCG/2ML IJ SOLN: 25.0000 ug | INTRAMUSCULAR | Status: DC | PRN

## 2020-10-31 MED ORDER — DEXAMETHASONE SODIUM PHOSPHATE 10 MG/ML IJ SOLN
INTRAMUSCULAR | Status: AC
  Filled 2020-10-31: qty 1

## 2020-10-31 MED ORDER — PROPOFOL 10 MG/ML IV BOLUS
INTRAVENOUS | Status: DC | PRN
  Administered 2020-10-31: 130 mg via INTRAVENOUS

## 2020-10-31 MED ORDER — CHLORHEXIDINE GLUCONATE 4 % EX LIQD: 60.0000 mL | Freq: Once | CUTANEOUS | Status: DC

## 2020-10-31 MED ORDER — OXYCODONE-ACETAMINOPHEN 5-325 MG PO TABS: 1.0000 | ORAL_TABLET | Freq: Four times a day (QID) | ORAL | 0 refills | Status: AC | PRN

## 2020-10-31 MED ORDER — FENTANYL CITRATE (PF) 250 MCG/5ML IJ SOLN
INTRAMUSCULAR | Status: AC
  Filled 2020-10-31: qty 5

## 2020-10-31 MED ORDER — DEXAMETHASONE SODIUM PHOSPHATE 10 MG/ML IJ SOLN
INTRAMUSCULAR | Status: DC | PRN
  Administered 2020-10-31: 8 mg via INTRAVENOUS

## 2020-10-31 MED ORDER — PHENYLEPHRINE 40 MCG/ML (10ML) SYRINGE FOR IV PUSH (FOR BLOOD PRESSURE SUPPORT)
PREFILLED_SYRINGE | INTRAVENOUS | Status: DC | PRN
Start: 2020-10-31 — End: 2020-10-31
  Administered 2020-10-31 (×2): 200 ug via INTRAVENOUS

## 2020-10-31 MED ORDER — LIDOCAINE-EPINEPHRINE 1 %-1:100000 IJ SOLN
INTRAMUSCULAR | Status: AC
  Filled 2020-10-31: qty 1

## 2020-10-31 MED ORDER — LIDOCAINE 2% (20 MG/ML) 5 ML SYRINGE
INTRAMUSCULAR | Status: AC
  Filled 2020-10-31: qty 5

## 2020-10-31 MED ORDER — 0.9 % SODIUM CHLORIDE (POUR BTL) OPTIME
TOPICAL | Status: DC | PRN
  Administered 2020-10-31: 1000 mL

## 2020-10-31 MED ORDER — CEFAZOLIN SODIUM-DEXTROSE 2-4 GM/100ML-% IV SOLN
2.0000 g | INTRAVENOUS | Status: AC
  Administered 2020-10-31: 2 g via INTRAVENOUS
  Filled 2020-10-31: qty 100

## 2020-10-31 MED ORDER — BUPIVACAINE LIPOSOME 1.3 % IJ SUSP
INTRAMUSCULAR | Status: AC
  Filled 2020-10-31: qty 20

## 2020-10-31 MED ORDER — SODIUM CHLORIDE 0.9 % IV SOLN: INTRAVENOUS | Status: DC

## 2020-10-31 MED ORDER — LIDOCAINE 2% (20 MG/ML) 5 ML SYRINGE
INTRAMUSCULAR | Status: DC | PRN
  Administered 2020-10-31: 60 mg via INTRAVENOUS

## 2020-10-31 MED ORDER — HEPARIN 6000 UNIT IRRIGATION SOLUTION
Status: DC | PRN
  Administered 2020-10-31: 1

## 2020-10-31 MED ORDER — BUPIVACAINE HCL (PF) 0.5 % IJ SOLN
INTRAMUSCULAR | Status: AC
  Filled 2020-10-31: qty 30

## 2020-10-31 MED ORDER — MIDAZOLAM HCL 2 MG/2ML IJ SOLN
INTRAMUSCULAR | Status: DC | PRN
  Administered 2020-10-31: 2 mg via INTRAVENOUS

## 2020-10-31 MED ORDER — PHENYLEPHRINE HCL-NACL 10-0.9 MG/250ML-% IV SOLN
INTRAVENOUS | Status: DC | PRN
  Administered 2020-10-31: 50 ug/min via INTRAVENOUS

## 2020-10-31 MED ORDER — MIDAZOLAM HCL 2 MG/2ML IJ SOLN
INTRAMUSCULAR | Status: AC
  Filled 2020-10-31: qty 2

## 2020-10-31 MED ORDER — OXYCODONE-ACETAMINOPHEN 5-325 MG PO TABS: 1.0000 | ORAL_TABLET | Freq: Four times a day (QID) | ORAL | 0 refills | Status: DC | PRN

## 2020-10-31 MED ORDER — PROPOFOL 10 MG/ML IV BOLUS
INTRAVENOUS | Status: AC
  Filled 2020-10-31: qty 40

## 2020-10-31 MED ORDER — ONDANSETRON HCL 4 MG/2ML IJ SOLN
INTRAMUSCULAR | Status: DC | PRN
  Administered 2020-10-31: 4 mg via INTRAVENOUS

## 2020-10-31 MED ORDER — CHLORHEXIDINE GLUCONATE 0.12 % MT SOLN
OROMUCOSAL | Status: AC
  Administered 2020-10-31: 15 mL
  Filled 2020-10-31: qty 15

## 2020-10-31 SURGICAL SUPPLY — 38 items
ARMBAND PINK RESTRICT EXTREMIT (MISCELLANEOUS) ×2 IMPLANT
BAG COUNTER SPONGE SURGICOUNT (BAG) ×2 IMPLANT
BNDG ELASTIC 4X5.8 VLCR STR LF (GAUZE/BANDAGES/DRESSINGS) ×2 IMPLANT
BNDG GAUZE ELAST 4 BULKY (GAUZE/BANDAGES/DRESSINGS) ×2 IMPLANT
CANISTER SUCT 3000ML PPV (MISCELLANEOUS) ×2 IMPLANT
CLIP VESOCCLUDE MED 24/CT (CLIP) ×2 IMPLANT
CLIP VESOCCLUDE MED 6/CT (CLIP) IMPLANT
CLIP VESOCCLUDE SM WIDE 24/CT (CLIP) ×2 IMPLANT
CLIP VESOCCLUDE SM WIDE 6/CT (CLIP) IMPLANT
COVER PROBE W GEL 5X96 (DRAPES) ×2 IMPLANT
DERMABOND ADVANCED (GAUZE/BANDAGES/DRESSINGS) ×1
DERMABOND ADVANCED .7 DNX12 (GAUZE/BANDAGES/DRESSINGS) ×1 IMPLANT
ELECT REM PT RETURN 9FT ADLT (ELECTROSURGICAL) ×2
ELECTRODE REM PT RTRN 9FT ADLT (ELECTROSURGICAL) ×1 IMPLANT
GLOVE SURG POLYISO LF SZ7.5 (GLOVE) ×2 IMPLANT
GLOVE SURG UNDER POLY LF SZ7.5 (GLOVE) ×2 IMPLANT
GOWN STRL REUS W/ TWL LRG LVL3 (GOWN DISPOSABLE) ×3 IMPLANT
GOWN STRL REUS W/ TWL XL LVL3 (GOWN DISPOSABLE) ×1 IMPLANT
GOWN STRL REUS W/TWL LRG LVL3 (GOWN DISPOSABLE) ×6
GOWN STRL REUS W/TWL XL LVL3 (GOWN DISPOSABLE) ×2
HEMOSTAT SNOW SURGICEL 2X4 (HEMOSTASIS) IMPLANT
KIT BASIN OR (CUSTOM PROCEDURE TRAY) ×2 IMPLANT
KIT TURNOVER KIT B (KITS) ×2 IMPLANT
NEEDLE 18GX1X1/2 (RX/OR ONLY) (NEEDLE) ×2 IMPLANT
NS IRRIG 1000ML POUR BTL (IV SOLUTION) ×2 IMPLANT
PACK CV ACCESS (CUSTOM PROCEDURE TRAY) ×2 IMPLANT
PAD ARMBOARD 7.5X6 YLW CONV (MISCELLANEOUS) ×4 IMPLANT
SPONGE T-LAP 18X18 ~~LOC~~+RFID (SPONGE) ×2 IMPLANT
SUT PROLENE 5 0 C 1 24 (SUTURE) ×10 IMPLANT
SUT PROLENE 6 0 CC (SUTURE) ×2 IMPLANT
SUT SILK 2 0 SH (SUTURE) ×2 IMPLANT
SUT VIC AB 3-0 SH 27 (SUTURE) ×4
SUT VIC AB 3-0 SH 27X BRD (SUTURE) ×2 IMPLANT
SUT VICRYL 4-0 PS2 18IN ABS (SUTURE) ×2 IMPLANT
SYR 30ML LL (SYRINGE) ×2 IMPLANT
TOWEL GREEN STERILE (TOWEL DISPOSABLE) ×2 IMPLANT
UNDERPAD 30X36 HEAVY ABSORB (UNDERPADS AND DIAPERS) ×2 IMPLANT
WATER STERILE IRR 1000ML POUR (IV SOLUTION) ×2 IMPLANT

## 2020-10-31 NOTE — Transfer of Care (Signed)
Immediate Anesthesia Transfer of Care Note  Patient: Christian [name]  Procedure(s) Performed: LEFT SECOND STAGE [place] (Left)  Patient Location: PACU  Anesthesia Type:General  Level of Consciousness: drowsy and patient cooperative  Airway & Oxygen Therapy: Patient Spontanous Breathing and Patient connected to face mask oxygen  Post-op Assessment: Report given to RN and Post -op Vital signs reviewed and stable  Post vital signs: Reviewed and stable  Last Vitals:  Vitals Value Taken Time  BP 172/89 10/31/20 1130  Temp 36 C 10/31/20 1130  Pulse 79 10/31/20 1131  Resp 15 10/31/20 1131  SpO2 98 % 10/31/20 1131  Vitals shown include unvalidated device data.  Last Pain:  Vitals:   10/31/20 1130  TempSrc:   PainSc: Asleep         Complications: No notable events documented.

## 2020-10-31 NOTE — Anesthesia Postprocedure Evaluation (Signed)
Anesthesia Post Note  Patient: Christian [name]  Procedure(s) Performed: LEFT SECOND STAGE [place] (Left)     Patient location during evaluation: PACU Anesthesia Type: General Level of consciousness: awake and alert Pain management: pain level controlled Vital Signs Assessment: post-procedure vital signs reviewed and stable Respiratory status: spontaneous breathing, nonlabored ventilation, respiratory function stable and patient connected to nasal cannula oxygen Cardiovascular status: blood pressure returned to baseline and stable Postop Assessment: no apparent nausea or vomiting Anesthetic complications: no   No notable events documented.  Last Vitals:  Vitals:   10/31/20 1115 10/31/20 1130  BP: (!) 153/91 (!) 172/89  Pulse: 73 75  Resp: 12 15  Temp:  (!) 36 C  SpO2: 100% 98%    Last Pain:  Vitals:   10/31/20 1130  TempSrc:   PainSc: Asleep                 Macgregor Aeschliman L Candra Wegner

## 2020-10-31 NOTE — Interval H&P Note (Signed)
History and Physical Interval Note:  10/31/2020 8:37 AM  Christian [name]  has presented today for surgery, with the diagnosis of CKD 4.  The various methods of treatment have been discussed with the patient and family. After consideration of risks, benefits and other options for treatment, the patient has consented to  Procedure(s): LEFT SECOND STAGE [place] (Left) as a surgical intervention.  The patient's history has been reviewed, patient examined, no change in status, stable for surgery.  I have reviewed the patient's chart and labs.  Questions were answered to the patient's satisfaction.     Annamarie Major

## 2020-10-31 NOTE — Anesthesia Procedure Notes (Signed)
Procedure Name: LMA Insertion Date/Time: 10/31/2020 9:12 AM Performed by: Kathryne Hitch, CRNA Pre-anesthesia Checklist: Patient identified, Emergency Drugs available, Suction available and Patient being monitored Patient Re-evaluated:Patient Re-evaluated prior to induction Oxygen Delivery Method: Circle system utilized Preoxygenation: Pre-oxygenation with 100% oxygen Induction Type: IV induction Ventilation: Mask ventilation without difficulty LMA: LMA inserted LMA Size: 5.0 Number of attempts: 1 Placement Confirmation: positive ETCO2 Tube secured with: Tape Dental Injury: Teeth and Oropharynx as per pre-operative assessment

## 2020-10-31 NOTE — Op Note (Signed)
    Patient name: IZSAK MEIR MRN: [id number] DOB: 06-Mar-1946 Sex: male  10/31/2020 Pre-operative Diagnosis: CKD4 Post-operative diagnosis:  Same Surgeon:  Annamarie Major Assistants: Laurence Slate Procedure:   Left second stage basilic vein fistula (fistula revision) Anesthesia: General Blood Loss: 150 cc Specimens: None  Findings: Excellent basilic vein measuring approximately 1 cm.  The anastomosis was near the antecubital crease.  There was fullness around the previous incision.  This area was explored and it was all scar tissue.  There was no fluid.  Indications: This is a 75 year old gentleman who was previously undergone for stage basilic vein fistula creation.  The fistula has matured nicely.  He comes in today for second stage.  Procedure:  The patient was identified in the holding area and taken to [place] 12  The patient was then placed supine on the table. general anesthesia was administered.  The patient was prepped and draped in the usual sterile fashion.  A time out was called and antibiotics were administered.  A PA was necessary to expedite the procedure and assist with technical details.  Ultrasound was used to map the course of the basilic vein in the upper arm.  There was of excellent caliber measuring 1 cm.  2 longitudinal incisions were made in the upper arm.  Cautery was used divide subcutaneous tissue down to the fistula.  Identify the nerve and protected it.  The fistula was fully mobilized from the antecubital crease up to the axilla.  Multiple side branches were ligated between silk ties.  During dissection, a rent was made within the midportion of the basilic vein.  This was a longitudinal defect measuring slightly less than 1 cm which was repaired primarily without narrowing of the fistula.  Once the vein was fully mobilized, it was marked for orientation.  Exparel was then infiltrated within the incision and the expected tunnel.  A curved Gore tunneler was used to  create the tunnel.  Baby Gregory clamps were placed proximally distally and the fistula was divided near the antecubital crease.  A uterine dressing forcep clamp was passed through the tunnel and grabbed the vein and brought the vein through the tunnel making sure to maintain proper orientation.  Next a end-to-end anastomosis was created with running 5-0 Prolene.  Once this was completed the clamps were released.  There was an excellent thrill within the fistula.  The wound was irrigated.  Hemostasis was achieved.  The deep tissue was reapproximated with 3-0 Vicryl and the skin was closed with subcuticular stitch followed by Dermabond.  Kerlix and Ace wrap were then applied.  The patient was successfully extubated and taken recovery in stable condition.  There were no immediate complications.   Disposition: To PACU stable.   Theotis Burrow, M.D., [facility] [place] Office: [phone] Pager:  [phone]

## 2020-10-31 NOTE — Discharge Instructions (Signed)
° °  [facility] ° °Discharge Instructions ° °AV Fistula or Graft Surgery for Dialysis Access ° °Please refer to the following instructions for your post-procedure care. Your surgeon or physician assistant will discuss any changes with you. ° °Activity ° °You may drive the day following your surgery, if you are comfortable and no longer taking prescription pain medication. Resume full activity as the soreness in your incision resolves. ° °Bathing/Showering ° °You may shower after you go home. Keep your incision dry for 48 hours. Do not soak in a bathtub, hot tub, or swim until the incision heals completely. You may not shower if you have a hemodialysis catheter. ° °Incision Care ° °Clean your incision with mild soap and water after 48 hours. Pat the area dry with a clean towel. You do not need a bandage unless otherwise instructed. Do not apply any ointments or creams to your incision. You may have skin glue on your incision. Do not peel it off. It will come off on its own in about one week. Your arm may swell a bit after surgery. To reduce swelling use pillows to elevate your arm so it is above your heart. Your doctor will tell you if you need to lightly wrap your arm with an ACE bandage. ° °Diet ° °Resume your normal diet. There are not special food restrictions following this procedure. In order to heal from your surgery, it is CRITICAL to get adequate nutrition. Your body requires vitamins, minerals, and protein. Vegetables are the best source of vitamins and minerals. Vegetables also provide the perfect balance of protein. Processed food has little nutritional value, so try to avoid this. ° °Medications ° °Resume taking all of your medications. If your incision is causing pain, you may take over-the counter pain relievers such as acetaminophen (Tylenol). If you were prescribed a stronger pain medication, please be aware these medications can cause nausea and constipation. Prevent  nausea by taking the medication with a snack or meal. Avoid constipation by drinking plenty of fluids and eating foods with high amount of fiber, such as fruits, vegetables, and grains. Do not take Tylenol if you are taking prescription pain medications. ° ° ° ° °Follow up °Your surgeon may want to see you in the office following your access surgery. If so, this will be arranged at the time of your surgery. ° °Please call us immediately for any of the following conditions: ° °Increased pain, redness, drainage (pus) from your incision site °Fever of 101 degrees or higher °Severe or worsening pain at your incision site °Hand pain or numbness. ° °Reduce your risk of vascular disease: ° °Stop smoking. If you would like help, call [facility] at 1-800-QUIT-NOW (1-800-784-8669) or [place] at [phone] ° °Manage your cholesterol °Maintain a desired weight °Control your diabetes °Keep your blood pressure down ° °Dialysis ° °It will take several weeks to several months for your new dialysis access to be ready for use. Your surgeon will determine when it is OK to use it. Your nephrologist will continue to direct your dialysis. You can continue to use your Permcath until your new access is ready for use. ° °If you have any questions, please call the office at [phone]. ° °

## 2020-11-01 ENCOUNTER — Encounter (HOSPITAL_COMMUNITY): Payer: Self-pay | Admitting: Surgery

## 2020-11-03 ENCOUNTER — Other Ambulatory Visit: Payer: Self-pay | Admitting: Internal Medicine

## 2020-11-05 ENCOUNTER — Other Ambulatory Visit: Payer: Self-pay

## 2020-11-05 ENCOUNTER — Ambulatory Visit (INDEPENDENT_AMBULATORY_CARE_PROVIDER_SITE_OTHER): Payer: Medicare Other | Admitting: Pharmacist

## 2020-11-05 DIAGNOSIS — I10 Essential (primary) hypertension: Secondary | ICD-10-CM

## 2020-11-05 DIAGNOSIS — E1169 Type 2 diabetes mellitus with other specified complication: Secondary | ICD-10-CM | POA: Diagnosis not present

## 2020-11-05 DIAGNOSIS — E785 Hyperlipidemia, unspecified: Secondary | ICD-10-CM

## 2020-11-05 DIAGNOSIS — I5032 Chronic diastolic (congestive) heart failure: Secondary | ICD-10-CM

## 2020-11-05 DIAGNOSIS — N184 Chronic kidney disease, stage 4 (severe): Secondary | ICD-10-CM

## 2020-11-05 DIAGNOSIS — Z951 Presence of aortocoronary bypass graft: Secondary | ICD-10-CM

## 2020-11-05 NOTE — Progress Notes (Signed)
Chronic Care Management Pharmacy Note  11/06/2020 Name:  Christian [name] MRN:  [id number] DOB:  11/28/1945  Summary: -Pt's wife endorses compliance with recent medications per hospital visits and specialists - updated med list -Pt reports feeling well ovrall but is having some additional swelling in R leg; he reports he has f/u with cardiology this week  Recommendations/Changes made from today's visit: -Advised to continue same medications until f/u with cardiology -Counseled on low salt diet   Subjective: Christian [name] is an 75 y.o. year old male who is a primary patient of Hoyt Koch, MD.  The CCM team was consulted for assistance with disease management and care coordination needs.    Engaged with patient by telephone for follow up visit in response to provider referral for pharmacy case management and/or care coordination services.   Consent to Services:  The patient was given information about Chronic Care Management services, agreed to services, and gave verbal consent prior to initiation of services.  Please see initial visit note for detailed documentation.   Patient Care Team: Hoyt Koch, MD as PCP - General (Internal Medicine) Gardiner Barefoot, DPM as Consulting Physician (Podiatry) McKenzie, Candee Furbish, MD as Consulting Physician (Urology) Woody Seller Sylvan Cheese, MD as Referring Physician (Cardiology) Charlton Haws, [facility] as Pharmacist (Pharmacist)   Patient lives at home with wife who handles his medications. She sets up pill box for him and keeps track of changes on med list. Patient is currently getting help from home health nurse 3 days a week but this service will end soon per patient. He is interested in getting into [facility] for supervised physical therapy and exercise.   05/14/20 - patient reports feeling "bad", he is not eating, difficult to get of bed, no motivation to do anything, and trouble remembering things. He had a  phone call with cardiac rehab yesterday that he did not remember today. This has been going on for 2 weeks per patient, even longer per wife.   Recent office visits: 06/28/20 Dr Sharlet Salina OV: chronic f/u; pt gained 30 lbs since last month w/ appetite stimulation from mirtazapine. He slept a lot better. Decided to d/c mirtazapine, may consider restarting lower dose if sleep patterns worsen. Counseled DASH diet and low sodium diet.  05/28/20 Dr Sharlet Salina OV: f/u fatigue; rx'd mirtazapine; dc'd tamsulosin, Rapaflo, Renvela.  03/25/20 Dr Sharlet Salina OV: chronic f/u, pt recovering from complicated CABG in spring. Following w/ cardiology and nephrology. No med changes.  Recent consult visits: 10/14/20 PA Cantwell (cardiology): BP elevated (recent reduction of metoprolol to 150 mg in hospital); increase hydralazine to 50 mg TID.  09/24/20 Dr Royce Macadamia (nephrology): f/u CKD. No med changes.  09/13/20 Dr Donzetta Matters (vascular surgery): eval for dialysis access. Plan for AV fistula  08/29/20 Dr Einar Gip (cardiology): BP/HR improved with 200 mg metoprolol. D/C plavix due to 1 year on DAPT.  07/11/20 Dr Einar Gip (cardiology): increased metoprolol to 200 mg daily  05/09/20 Dr Royce Macadamia (nephrology): increased furosmide to 80 mg BID. Referred to [facility] for AVF placement. F/u with urology for incontinence issues  05/03/19 Dr Einar Gip (cardiology): continue same meds. Scheduled for cardiac rehab in March.   03/05/20 Dr Einar Gip (cardiology): BP 158/96.  off HD since 02/01/20. Increased metoprolol to 50 mg, added isosorbide DN 30 mg TID and hydralazine 25 mg TID.   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  Hospital visits: 10/31/20 admission for vein transposition.  Medication Reconciliation was completed by comparing discharge summary, patient's EMR and Pharmacy list, and upon discussion with patient.  Admitted to  the hospital on 10/03/20 due to CHF exacerbation Discharge date was 10/07/20. Discharged from [facility]?Medications Started at [facility] Discharge:?? -started potassium 40 meq x 15 days  -clopidogrel 75 mg (restarted)  Medication Changes at Hospital Discharge: -Changed furosemide to 120 mg BID for diuresis -Reduced metoprolol to 150 mg given volume overload  Medications Discontinued at Hospital Discharge: -Stopped oxycodone  Medications that remain the same after Hospital Discharge:??  -All other medications will remain the same.    09/18/20 admission to Vascular for AV fistula.  Objective:  Lab Results  Component Value Date   CREATININE 2.80 (H) 10/31/2020   BUN 48 (H) 10/31/2020   GFR 17.66 (L) 05/28/2020   GFRNONAA 23 (L) 10/05/2020   GFRAA 19 (L) 10/18/2019   NA 143 10/31/2020   K 3.4 (L) 10/31/2020   CALCIUM 9.1 10/23/2020   CO2 19 (L) 10/23/2020   GLUCOSE 102 (H) 10/31/2020    Lab Results  Component Value Date/Time   HGBA1C 5.4 03/25/2020 11:55 AM   HGBA1C 4.9 12/20/2019 01:12 PM   GFR 17.66 (L) 05/28/2020 03:38 PM   GFR 18.69 (L) 03/25/2020 11:55 AM    Last diabetic Eye exam:  Lab Results  Component Value Date/Time   HMDIABEYEEXA No Retinopathy 05/30/2020 10:25 AM    Last diabetic Foot exam: No results found for: HMDIABFOOTEX   Lab Results  Component Value Date   CHOL 107 08/11/2019   HDL 42 08/11/2019   LDLCALC 40 08/11/2019   LDLDIRECT 40.0 11/23/2018   TRIG 71 08/27/2019   CHOLHDL 2.5 08/11/2019    Hepatic Function Latest Ref Rng & Units 10/04/2020 05/28/2020 03/25/2020  Total Protein 6.5 - 8.1 g/dL 7.0 8.0 -  Albumin 3.5 - 5.0 g/dL 3.8 4.1 4.1  AST 15 - 41 U/L 32 32 -  ALT 0 - 44 U/L 52(H) 28 -  Alk Phosphatase 38 - 126 U/L 80 80 -  Total Bilirubin 0.3 - 1.2 mg/dL 0.5 0.3 -    Lab Results  Component Value Date/Time   TSH 1.24 05/28/2020 03:38 PM   TSH 2.590 04/19/2019 09:11 AM    CBC Latest Ref Rng & Units 10/31/2020  10/23/2020 10/05/2020  WBC 3.4 - 10.8 x10E3/uL - 8.8 7.0  Hemoglobin 13.0 - 17.0 g/dL 11.2(L) 11.3(L) 10.3(L)  Hematocrit 39.0 - 52.0 % 33.0(L) 33.5(L) 33.0(L)  Platelets 150 - 450 x10E3/uL - 204 164    Lab Results  Component Value Date/Time   VD25OH 58.52 05/28/2020 03:38 PM   VD25OH 95.53 10/19/2017 11:10 AM    Clinical ASCVD: Yes  The ASCVD Risk score Mikey Bussing DC Jr., et al., 2013) failed to calculate for the following reasons:   The patient has a prior MI or stroke diagnosis    Depression screen [facility] 2/9 09/12/2020 09/12/2020 06/25/2020  Decreased Interest 0 0 0  Down, Depressed, Hopeless 0 0 0  PHQ - 2 Score 0 0 0  Some recent data might be hidden     Social History   Tobacco Use  Smoking Status Former   Packs/day: 0.50   Years: 20.00   Pack years: 10.00   Types: Cigarettes   Quit date: 1975   Years since quitting: 47.5  Smokeless Tobacco Never   BP Readings from Last 3 Encounters:  10/31/20 Marland Kitchen)  172/89  10/21/20 121/68  10/14/20 (!) 142/84   Pulse Readings from Last 3 Encounters:  10/31/20 75  10/21/20 91  10/14/20 85   Wt Readings from Last 3 Encounters:  10/21/20 206 lb 12.8 oz (93.8 kg)  10/14/20 206 lb (93.4 kg)  10/07/20 205 lb 14.4 oz (93.4 kg)   BMI Readings from Last 3 Encounters:  10/21/20 32.39 kg/m  10/14/20 32.26 kg/m  10/07/20 32.25 kg/m    Assessment/Interventions: Review of patient past medical history, allergies, medications, health status, including review of consultants reports, laboratory and other test data, was performed as part of comprehensive evaluation and provision of chronic care management services.   SDOH:  (Social Determinants of Health) assessments and interventions performed: Yes  SDOH Screenings   Alcohol Screen: Not on file  Depression (PHQ2-9): Low Risk    PHQ-2 Score: 0  Financial Resource Strain: Low Risk    Difficulty of Paying Living Expenses: Not very hard  Food Insecurity: Not on file  Housing: Not on file   Physical Activity: Not on file  Social Connections: Not on file  Stress: Not on file  Tobacco Use: Medium Risk   Smoking Tobacco Use: Former   Smokeless Tobacco Use: Never  Transportation Needs: Not on file    [place]  No Known Allergies  Medications Reviewed Today     Reviewed by Charlton Haws, [place] (Pharmacist) on 11/05/20 at 1207  Med List Status: <None>   Medication Order Taking? Sig Documenting Provider Last Dose Status Informant  aspirin EC 81 MG tablet [id number] Yes Take 81 mg by mouth daily. [provider] Taking Active Spouse/Significant Other  atorvastatin (LIPITOR) 20 MG tablet [id number] Yes Take 1 tablet (20 mg total) by mouth every evening. Adrian Prows, MD Taking Active Spouse/Significant Other  clonazePAM (KLONOPIN) 0.5 MG disintegrating tablet [id number] Yes TAKE 1 TABLET BY MOUTH TWICE A DAY Hoyt Koch, MD Taking Active   clopidogrel (PLAVIX) 75 MG tablet [id number] Yes Take 1 tablet (75 mg total) by mouth daily. Adrian Prows, MD Taking Active            Med Note Charlton Haws   Tue Nov 05, 2020 11:38 AM)    cromolyn (OPTICROM) 4 % ophthalmic solution [id number] Yes Place 1 drop into both eyes every Wednesday. At bedtime [provider] Taking Active Spouse/Significant Other  [facility] ACETAMINOPHEN 325 MG tablet [id number] Yes TAKE 2 CAPS BY MOUTH EVERY 6 HOURS AS NEEDED FOR PAIN FOR TEMP 100F OR ABOVE  Patient taking differently: Take 325 mg by mouth every 6 (six) hours as needed for moderate pain or mild pain.   Hoyt Koch, MD Taking Active   [facility] SENNA PLUS 8.6-50 MG tablet [id number] Yes TAKE 2 TABLETS BY MOUTH AT BEDTIME FOR CONSTIPATION Hoyt Koch, MD Taking Active   ferrous sulfate 325 (65 FE) MG tablet [id number] Yes Take 325 mg by mouth daily with breakfast. [provider] Taking Active Spouse/Significant Other           Med Note Maryjean Morn, RICARDO K   Wed Apr 24, 2020  2:09 PM)    furosemide  (LASIX) 80 MG tablet [id number] Yes Take 1.5 tablets (120 mg total) by mouth 2 (two) times daily with breakfast and lunch. Nita Sells, MD Taking Active Spouse/Significant Other  hydrALAZINE (APRESOLINE) 25 MG tablet [id number] Yes Take 2 tablets (50 mg total) by mouth 3 (three) times daily. Cantwell, Celeste C, PA-C Taking Active Spouse/Significant Other  isosorbide dinitrate (  ISORDIL) 30 MG tablet [id number] Yes Take 1 tablet (30 mg total) by mouth 2 (two) times daily. Nita Sells, MD Taking Active Spouse/Significant Other  metoprolol succinate (TOPROL-XL) 50 MG 24 hr tablet [id number] Yes TAKE 3 TABLETS BY MOUTH EVERY DAY Adrian Prows, MD Taking Active   multivitamin (RENA-VIT) TABS tablet [id number] Yes Take 1 tablet by mouth at bedtime. Barrett, Lodema Hong, PA-C Taking Active Spouse/Significant Other  nitroGLYCERIN (NITROSTAT) 0.4 MG SL tablet [id number] Yes Place 0.4 mg under the tongue every 5 (five) minutes as needed for chest pain. [provider] Taking Active Spouse/Significant Other  oxyCODONE-acetaminophen (PERCOCET) 5-325 MG tablet [id number] Yes Take 1 tablet by mouth every 6 (six) hours as needed for severe pain. Ulyses Amor, [place] Taking Active   Discontinued 88/50/27 7412 (Duplicate)   potassium chloride (KLOR-CON) 10 MEQ tablet [id number] Yes Take 10 mEq by mouth daily. [provider] Taking Active   PREVIDENT 5000 BOOSTER PLUS 1.1 % PSTE [id number] Yes Take 1 application by mouth every evening. [provider] Taking Active Spouse/Significant Other  sertraline (ZOLOFT) 25 MG tablet [id number] Yes TAKE 1 TABLET ONE TIME A DAY  Patient taking differently: Take 25 mg by mouth daily.   Hoyt Koch, MD Taking Active   silodosin (RAPAFLO) 8 MG CAPS capsule [id number] Yes TAKE 1 CAPSULE BY MOUTH EVERYDAY AT BEDTIME  Patient taking differently: Take 8 mg by mouth at bedtime.   McKenzie, Candee Furbish, MD Taking Active   Vitamin D, Ergocalciferol,  (DRISDOL) 1.25 MG (50000 UNIT) CAPS capsule [id number] Yes Take 1 capsule (50,000 Units total) by mouth every 7 (seven) days.  Patient taking differently: Take 50,000 Units by mouth every Wednesday.   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  LIMA TO LAD, RIMA TO PDA, RADIAL ARTERY TO CIRC AND SEQUENTIALLY TO OM1. 08/15/2019   Ischemic cardiomyopathy    Swelling of lower extremity    History of coronary artery stent placement    Pulmonary embolism ([place]) 08/09/2019   Elevated liver enzymes 08/09/2019   Left leg weakness 11/23/2018   CAD (coronary artery disease) 07/30/2018   Other fatigue 10/19/2017   Routine general medical examination at a health care facility 08/30/2016   Diabetes mellitus with polyneuropathy ([place]) 10/10/2014   Benign prostatic hyperplasia 09/27/2014   Stable angina pectoris ([place]) 02/27/2014   S/P PTCA (percutaneous transluminal coronary angioplasty) 02/27/2014   Hyperlipidemia associated with type 2 diabetes mellitus ([place]) 11/12/2009   MULTIPLE SCLEROSIS 11/12/2009   HTN (hypertension) 11/12/2009    Immunization History  Administered Date(s) Administered   Fluad Quad(high Dose 65+) 01/09/2019   Influenza, High Dose Seasonal PF 02/08/2017, 01/17/2018, 01/30/2020   Influenza,inj,Quad PF,6+ Mos 02/28/2014, 02/11/2015   Influenza-Unspecified  03/11/2017   PFIZER(Purple Top)SARS-COV-2 Vaccination 06/01/2019, 06/22/2019, 01/18/2020   Pneumococcal Conjugate-13 08/13/2015, 01/09/2019   Pneumococcal Polysaccharide-23 02/28/2014  Tdap 08/13/2015    Conditions to be addressed/monitored:  Hypertension, Hyperlipidemia, Diabetes, Heart Failure, Coronary Artery Disease, and Chronic Kidney Disease  Care Plan : [place]  Updates made by Charlton Haws, [place] since 11/06/2020 12:00 AM     Problem: HTN, HLD, CAD, DM, CKD IV   Priority: High     Long-Range Goal: Disease management   Start Date: 05/24/2020  Expected End Date: 05/27/2021  This Visit's Progress: On track  Recent Progress: On track  Priority: High  Note:   Current Barriers:  Unable to independently monitor therapeutic efficacy Difficult to self administer medications as prescribed  Pharmacist Clinical Goal(s):  Over the next 30 days, patient will achieve adherence to monitoring guidelines and medication adherence to achieve therapeutic efficacy through collaboration with PharmD and provider.   Interventions: 1:1 collaboration with Hoyt Koch, MD regarding development and update of comprehensive plan of care as evidenced by provider attestation and co-signature Inter-disciplinary care team collaboration (see longitudinal plan of care) Comprehensive medication review performed; medication list updated in electronic medical record  Hypertension / Heart Failure (BP goal <130/80) -Controlled - pt endorses compliances with medications as prescribed; verbalized most recent medication changes; he reports some wheezing and swelling in R leg; he reports he has f/u appt with cardiology PA on 7/13; he reports nephrologist recently reduced potassium to 10 meq daily -HF type: diastolic (EF 62-83% 04/5174) -Current treatment: Metoprolol succinate 50 mg  - 3 tab daily Furosemide 80 mg + 40 mg (120 mg) BID Hydralazine 25 mg - 2 tab mg TID Isosorbide DN 30  mg BID Potassium 10 mEq daily -Current home readings: not available -Current dietary habits: trying to limit salt -Current exercise habits: limited  -Educated on BP goals and benefits of medications for prevention of heart attack, stroke and kidney damage; Importance of home blood pressure monitoring; importance of limiting sodium to < 2000 mg/day -Advised to discuss swelling/wheezing at upcoming cardiology appt -Recommended to continue current medication  Hyperlipidemia/ CAD: (LDL goal < 70) -Controlled - most recent LDL was at goal in 07/2019 (right after CABG); pt endorses compliance with statin and clopidogrel; of note clopidogrel was stopped earlier this year by cardiologist, but restarted in hospitalization for CHF in May; -Hx CAD. CABG 04/6071 w/ complicated post-op including cardiac arrest and acute renal failure requiring HD. -Current treatment: Atorvastatin 20 mg daily Clopidogrel 75 mg daily Aspirin 81 mg daily Isosorbide DN 30 mg TID Nitroglycerin 0.4 mg SL prn -Educated on Cholesterol goals; Benefits of statin for ASCVD risk reduction; -Recommended to continue current medication -Repeat lipid panel due this year  Depression/Anxiety (Goal: manage symptoms) -Controlled - pt does not endorse issues with mental health currently -Current treatment: Sertraline 25 mg daily Clonazepam 0.5 mg BID prn -Medications previously tried/failed: mirtazapine (worked well for sleep, but appetite stimulation caused 25 lb wt gain in 1 month) -PHQ9: 0 (08/2020) -GAD7: not on file -Connected with PCP for mental health support -Educated on Benefits of medication for symptom control -Recommended to continue current medication  BPH (Goal: manage symptoms) -Controlled - Pt reports compliance with Rapaflo; he follows with Dr Alyson Ingles regularly; he wants to know if he can get a self-cath to use for convenience - he denies incontinence, problems with emptying bladder, etc -Current treatment  Rapaflo  8 mg -Counseled on usual indications for catheter; advised pt to discuss with urologist  CKD Stage 4 (Goal: prevent progression) -Not ideally controlled - pt follows with Dr Royce Macadamia and is  preparing for dialysis; he had AV fistula procedure in May 2021;  -Counseled on importance of managing BP and DM to prevent further kidney damage  Health Maintenance -Vaccine gaps: Shingrix, Covid booster -Current therapy:  Vitamin D 50,000 IU weekly (Wed) Senna-docusate 1-2 tablets HS Ferrous sulfate 325 mg daily -Recommended to continue current medication  Patient Goals/Self-Care Activities Patient will:  - take medications as prescribed - Check BP daily and record results - Weigh daily; if gaining 2+ lbs overnight or 5+ lbs in a week, contact cardiology -Keep appt with cardiology 7/14 and discuss wheezing/swelling     Medication Assistance: None required.  Patient affirms current coverage meets needs.  Compliance/Adherence/Medication fill history: Care Gaps: Shingrix Covid booster (due 05/19/20) A1c (due 09/22/20) Colonoscopy (due 09/27/18)  Star-Rating Drugs: Atorvastatin - LF 10/16/20 x 90 ds  Patient's preferred pharmacy is:  [id number] [place] [place] [place]Phone: [phone]: [phone] Uses pill box? Yes Pt endorses 100% compliance  We discussed: Current pharmacy is preferred with insurance plan and patient is satisfied with pharmacy services Patient decided to: Continue current medication management strategy  Care Plan and Follow Up Patient Decision:  Patient agrees to Care Plan and Follow-up.  Plan: Telephone follow up appointment with care management team member scheduled for:  3 months  LCharlene Brooke PharmD, BReyn[place] CPP Clinical Pharmacist [phone]

## 2020-11-06 NOTE — Patient Instructions (Signed)
Visit Information  Phone number for Pharmacist: [phone]   Goals Addressed             This Visit's Progress    Follow My Treatment Plan-Chronic Kidney       Timeframe:  Long-Range Goal Priority:  High Start Date:       05/24/20                      Expected End Date:       05/27/21  Follow up date: Oct 2022              - ask for help if I can't afford my medicines - call for medicine refill 2 or 3 days before it runs out - call the doctor or nurse before I stop taking medicine - call the doctor or nurse to get help with side effects - keep follow-up appointments - keep taking my medicines, even when I feel good    Why is this important?   Staying as healthy as you can is very important. This may mean making changes if you smoke, don't exercise or eat poorly.  A healthy lifestyle is an important goal for you.  Following the treatment plan and making changes may be hard.  Try some of these steps to help keep the disease from getting worse.        Track and Manage Fluids and Swelling-Heart Failure       Timeframe:  Long-Range Goal Priority:  High Start Date:   11/06/20                          Expected End Date:     05/27/20                  Follow Up Date Oct 2022   - call office if I gain more than 2 pounds in one day or 5 pounds in one week - keep legs up while sitting - track weight in diary - use salt in moderation - watch for swelling in feet, ankles and legs every day - weigh myself daily    Why is this important?   It is important to check your weight daily and watch how much salt and liquids you have.  It will help you to manage your heart failure.    Notes:          The patient verbalized understanding of instructions, educational materials, and care plan provided today and declined offer to receive copy of patient instructions, educational materials, and care plan.  The pharmacy team will reach out to the patient again over the next 60 days.    Charlene Brooke, PharmD, Para March, CPP Clinical Pharmacist [facility] at [facility] [phone]

## 2020-11-11 DIAGNOSIS — N186 End stage renal disease: Secondary | ICD-10-CM | POA: Diagnosis not present

## 2020-11-11 DIAGNOSIS — R262 Difficulty in walking, not elsewhere classified: Secondary | ICD-10-CM | POA: Diagnosis not present

## 2020-11-11 DIAGNOSIS — I214 Non-ST elevation (NSTEMI) myocardial infarction: Secondary | ICD-10-CM | POA: Diagnosis not present

## 2020-11-11 DIAGNOSIS — Z992 Dependence on renal dialysis: Secondary | ICD-10-CM | POA: Diagnosis not present

## 2020-11-12 ENCOUNTER — Encounter: Payer: Self-pay | Admitting: Podiatry

## 2020-11-12 ENCOUNTER — Ambulatory Visit (INDEPENDENT_AMBULATORY_CARE_PROVIDER_SITE_OTHER): Payer: Medicare Other | Admitting: Podiatry

## 2020-11-12 ENCOUNTER — Other Ambulatory Visit: Payer: Self-pay

## 2020-11-12 DIAGNOSIS — M79675 Pain in left toe(s): Secondary | ICD-10-CM | POA: Diagnosis not present

## 2020-11-12 DIAGNOSIS — B351 Tinea unguium: Secondary | ICD-10-CM

## 2020-11-12 DIAGNOSIS — M79674 Pain in right toe(s): Secondary | ICD-10-CM | POA: Diagnosis not present

## 2020-11-12 DIAGNOSIS — E1142 Type 2 diabetes mellitus with diabetic polyneuropathy: Secondary | ICD-10-CM

## 2020-11-15 NOTE — Progress Notes (Signed)
Subjective: Christian [name] is a pleasant 75 y.o. male patient seen today for at risk foot care with h/o diabetic neuropathy and ESRD. He has painful thick toenails that are difficult to trim. Pain interferes with ambulation. Aggravating factors include wearing enclosed shoe gear. Pain is relieved with periodic professional debridement.  PCP is Hoyt Koch, MD. Last visit was: 10/03/2020.  No Known Allergies  Objective: Physical Exam  General: Christian [name] is a pleasant 75 y.o. African American male, in NAD. AAO x 3.   Vascular:  Capillary refill time to digits immediate b/l. Palpable PT pulse(s) b/l lower extremities Faintly palpable DP pulse(s) b/l lower extremities. Pedal hair sparse. Lower extremity skin temperature gradient within normal limits. No pain with calf compression b/l. Trace edema noted b/l lower extremities. No ischemia or gangrene noted b/l lower extremities.  Dermatological:  Pedal skin with normal turgor, texture and tone b/l lower extremities. Toenails 2-5 bilaterally and R hallux elongated, discolored, dystrophic, thickened, and crumbly with subungual debris and tenderness to dorsal palpation. Anonychia noted L hallux. Nailbed(s) epithelialized.   Musculoskeletal:  Normal muscle strength 5/5 to all lower extremity muscle groups bilaterally. No pain crepitus or joint limitation noted with ROM b/l. No gross bony deformities bilaterally.  Neurological:  Pt has subjective symptoms of neuropathy. Protective sensation diminished with 10g monofilament b/l.  Assessment and Plan:  1. Pain due to onychomycosis of toenails of both feet   2. Diabetic polyneuropathy associated with type 2 diabetes mellitus ([place])    -Continue diabetic foot care principles. -Patient to continue soft, supportive shoe gear daily. -Toenails 2-5 bilaterally and R hallux debrided in length and girth without iatrogenic bleeding with sterile nail nipper and dremel.  -Patient to report any  pedal injuries to medical professional immediately. -Patient/POA to call should there be question/concern in the interim.  Return in about 3 months (around 02/12/2021).  Marzetta Board, DPM

## 2020-11-17 ENCOUNTER — Other Ambulatory Visit: Payer: Self-pay | Admitting: Internal Medicine

## 2020-11-20 ENCOUNTER — Other Ambulatory Visit: Payer: Self-pay

## 2020-11-20 ENCOUNTER — Ambulatory Visit (INDEPENDENT_AMBULATORY_CARE_PROVIDER_SITE_OTHER): Payer: Medicare Other | Admitting: Physician Assistant

## 2020-11-20 VITALS — BP 118/70 | HR 95 | Temp 97.4°F | Resp 20 | Ht 67.0 in | Wt 219.1 lb

## 2020-11-20 DIAGNOSIS — N186 End stage renal disease: Secondary | ICD-10-CM

## 2020-11-20 DIAGNOSIS — Z992 Dependence on renal dialysis: Secondary | ICD-10-CM

## 2020-11-20 NOTE — Progress Notes (Signed)
    Postoperative Access Visit   History of Present Illness   Christian [name] is a 75 y.o. year old male who presents for postoperative follow-up for: left second stage basilic vein transposition (Date:10/31/20 ) by Dr. Trula Slade.  The patient's wounds are healed.  The patient denies steal symptoms.  The patient is able to complete their activities of daily living.  He is not yet on HD.  He did however require HD briefly after being hospitalized with an MI requiring CABG in 07/2019.   Physical Examination   Vitals:   11/20/20 1109  BP: 118/70  Pulse: 95  Resp: 20  Temp: (!) 97.4 F (36.3 C)  TempSrc: Temporal  SpO2: 96%  Weight: 219 lb 1.6 oz (99.4 kg)  Height: 5\' 7"  (1.702 m)   Body mass index is 34.32 kg/m.  left arm Incisions are healed, hand grip is 5/5, sensation in digits is intact, palpable thrill, bruit can be auscultated     Medical Decision Making   Christian [name] is a 75 y.o. year old male who presents s/p left second stage basilic vein transposition  Patent L basilic vein fistula without signs or symptoms of steal syndrome The patient's access will be ready for use 11/28/20 The patient may follow up on a prn basis   [name] PA-C [facility] Office: [phone]  Clinic MD: Oneida Alar

## 2020-11-22 ENCOUNTER — Other Ambulatory Visit: Payer: Self-pay | Admitting: Cardiology

## 2020-11-24 ENCOUNTER — Other Ambulatory Visit: Payer: Self-pay | Admitting: Cardiology

## 2020-11-24 DIAGNOSIS — I119 Hypertensive heart disease without heart failure: Secondary | ICD-10-CM

## 2020-12-04 ENCOUNTER — Telehealth: Payer: Self-pay | Admitting: Pharmacist

## 2020-12-04 NOTE — Progress Notes (Addendum)
Chronic Care Management Pharmacy Assistant   Name: Christian [name]  MRN: [id number] DOB: 1945-05-27   Reason for Encounter: Disease State   Conditions to be addressed/monitored: HTN  Recent office visits:  None ID  Recent consult visits:  11/12/20 [name] DPPM, Podiatry Reason:Pain due to onychomycosis of toenails of both feet, Return in about 3 months 11/20/20 [name] PA-C, Vasc Surg Reason:ESRD on dialysis [facility])  Hospital visits:  Medication Reconciliation was completed by comparing discharge summary, patient's EMR and Pharmacy list, and upon discussion with patient.  Admitted to the hospital on 10/31/20 due to AV Fistula surgery. Discharge date was 10/31/20. Discharged from [facility].    Medications that remain the same after Hospital Discharge:??  -All other medications will remain the same.    Medications: Outpatient Encounter Medications as of 12/04/2020  Medication Sig   aspirin EC 81 MG tablet Take 81 mg by mouth daily.   atorvastatin (LIPITOR) 20 MG tablet Take 1 tablet (20 mg total) by mouth every evening.   clonazePAM (KLONOPIN) 0.5 MG disintegrating tablet TAKE 1 TABLET BY MOUTH TWICE A DAY   clopidogrel (PLAVIX) 75 MG tablet Take 1 tablet (75 mg total) by mouth daily.   cromolyn (OPTICROM) 4 % ophthalmic solution Place 1 drop into both eyes every Wednesday. At bedtime   [employer] ACETAMINOPHEN 325 MG tablet TAKE 2 CAPS BY MOUTH EVERY 6 HOURS AS NEEDED FOR PAIN FOR TEMP 100F OR ABOVE   [employer] SENNA PLUS 8.6-50 MG tablet TAKE 2 TABLETS BY MOUTH AT BEDTIME FOR CONSTIPATION   ferrous sulfate 325 (65 FE) MG tablet Take 325 mg by mouth daily with breakfast.   furosemide (LASIX) 80 MG tablet Take 1.5 tablets (120 mg total) by mouth 2 (two) times daily with breakfast and lunch.   hydrALAZINE (APRESOLINE) 25 MG tablet Take 2 tablets (50 mg total) by mouth 3 (three) times daily.   isosorbide dinitrate (ISORDIL) 30 MG tablet TAKE 1 TABLET BY MOUTH THREE  TIMES A DAY   metoprolol succinate (TOPROL-XL) 50 MG 24 hr tablet TAKE 3 TABLETS BY MOUTH EVERY DAY   multivitamin (RENA-VIT) TABS tablet Take 1 tablet by mouth at bedtime.   nitroGLYCERIN (NITROSTAT) 0.4 MG SL tablet Place 0.4 mg under the tongue every 5 (five) minutes as needed for chest pain.   oxyCODONE-acetaminophen (PERCOCET) 5-325 MG tablet Take 1 tablet by mouth every 6 (six) hours as needed for severe pain.   potassium chloride (KLOR-CON) 10 MEQ tablet Take 10 mEq by mouth daily.   PREVIDENT 5000 BOOSTER PLUS 1.1 % PSTE Take 1 application by mouth every evening.   sertraline (ZOLOFT) 25 MG tablet TAKE 1 TABLET ONE TIME A DAY   silodosin (RAPAFLO) 8 MG CAPS capsule TAKE 1 CAPSULE BY MOUTH EVERYDAY AT BEDTIME   Vitamin D, Ergocalciferol, (DRISDOL) 1.25 MG (50000 UNIT) CAPS capsule Take 1 capsule (50,000 Units total) by mouth every 7 (seven) days.   No facility-administered encounter medications on file as of 12/04/2020.     Recent Office Vitals: BP Readings from Last 3 Encounters:  11/20/20 118/70  10/31/20 (!) 172/89  10/21/20 121/68   Pulse Readings from Last 3 Encounters:  11/20/20 95  10/31/20 75  10/21/20 91    Wt Readings from Last 3 Encounters:  11/20/20 219 lb 1.6 oz (99.4 kg)  10/21/20 206 lb 12.8 oz (93.8 kg)  10/14/20 206 lb (93.4 kg)     Kidney Function Lab Results  Component Value Date/Time  CREATININE 2.80 (H) 10/31/2020 08:12 AM   CREATININE 2.87 (H) 10/23/2020 12:04 PM   CREATININE 2.87 (H) 12/20/2019 01:12 PM   GFR 17.66 (L) 05/28/2020 03:38 PM   GFRNONAA 23 (L) 10/05/2020 01:37 AM   GFRAA 19 (L) 10/18/2019 12:46 AM    BMP Latest Ref Rng & Units 10/31/2020 10/23/2020 10/05/2020  Glucose 70 - 99 mg/dL 102(H) 156(H) 94  BUN 8 - 23 mg/dL 48(H) 47(H) 45(H)  Creatinine 0.61 - 1.24 mg/dL 2.80(H) 2.87(H) 2.80(H)  BUN/Creat Ratio 10 - 24 - 16 -  Sodium 135 - 145 mmol/L 143 145(H) 141  Potassium 3.5 - 5.1 mmol/L 3.4(L) 4.3 3.3(L)  Chloride 98 - 111 mmol/L  110 109(H) 106  CO2 20 - 29 mmol/L - 19(L) 24  Calcium 8.6 - 10.2 mg/dL - 9.1 8.9     Contacted patient on 12/04/20 to discuss hypertension disease state  Current antihypertensive regimen:  Metoprolol succinate 50 mg  - 3 tab daily Furosemide 80 mg + 40 mg (120 mg) BID Hydralazine 25 mg - 2 tab mg TID Isosorbide DN 30 mg BID Potassium 10 mEq daily  Patient verbally confirms he is taking the above medications as directed. Yes  How often are you checking your Blood Pressure? twice daily  he checks his blood pressure at nighttime after taking his medication.  Current home BP readings: Patient states last night it was 180/90, then waited 1 hr and it came down to 150/90. Patient stated last week his blood pressure was elevated in the 200s. He states that one night it was 208/180, he waited 1 hr and it was 178/100, then 1hr after that it was 150/78. Patient states after that he went to bed.  DATE:             BP               PULSE      Wrist or arm cuff: takes both places Caffeine intake:sips on a cup of coffee all day-average about 2 cups daily Salt intake:very little OTC medications including pseudoephedrine or NSAIDs? Patient states he is taking something for a head cold he has had for 2 weeks but it is not working. He would like for the pharmacist to suggest something else that would help  Any readings above 180/120? Yes If yes any symptoms of hypertensive emergency? patient denies any symptoms of high blood pressure   What recent interventions/DTPs have been made by any provider to improve Blood Pressure control since last CPP Visit: None noted  Any recent hospitalizations or ED visits since last visit with CPP? Yes, patient had surgery on 10/31/20 at [facility]  What diet changes have been made to improve Blood Pressure Control?  Patient states that he has no made any changes to his diet  What exercise is being done to improve your Blood Pressure Control?  Patient  states that he gets very little exercise  Adherence Review: Is the patient currently on ACE/ARB medication? No Does the patient have >5 day gap between last estimated fill dates? No   Star Rating Drugs: Atorvastatin Medication:  Last Fill: Day Supply Atorvastatin 20 mg 10/16/20  90 ds   Care Gaps: Last annual wellness visit:Patient states that he has an annual home exam on 12/11/20   CCM appointment on 01/30/21, with clinical phamacist    [place] Pharmacist Assistant [phone]   Time spent:60

## 2020-12-11 ENCOUNTER — Other Ambulatory Visit: Payer: Self-pay

## 2020-12-11 ENCOUNTER — Emergency Department (HOSPITAL_COMMUNITY): Payer: Medicare Other

## 2020-12-11 ENCOUNTER — Other Ambulatory Visit: Payer: Self-pay | Admitting: Cardiology

## 2020-12-11 ENCOUNTER — Inpatient Hospital Stay (HOSPITAL_COMMUNITY)
Admission: EM | Admit: 2020-12-11 | Discharge: 2020-12-16 | DRG: 194 | Disposition: A | Discharge status: Home Health | Payer: Medicare Other | Attending: Family Medicine | Admitting: Family Medicine

## 2020-12-11 ENCOUNTER — Encounter (HOSPITAL_COMMUNITY): Payer: Self-pay

## 2020-12-11 DIAGNOSIS — Z9079 Acquired absence of other genital organ(s): Secondary | ICD-10-CM | POA: Diagnosis not present

## 2020-12-11 DIAGNOSIS — E8779 Other fluid overload: Secondary | ICD-10-CM | POA: Diagnosis not present

## 2020-12-11 DIAGNOSIS — N186 End stage renal disease: Secondary | ICD-10-CM | POA: Diagnosis not present

## 2020-12-11 DIAGNOSIS — Z79899 Other long term (current) drug therapy: Secondary | ICD-10-CM | POA: Diagnosis not present

## 2020-12-11 DIAGNOSIS — Z20822 Contact with and (suspected) exposure to covid-19: Secondary | ICD-10-CM | POA: Diagnosis not present

## 2020-12-11 DIAGNOSIS — Z6833 Body mass index (BMI) 33.0-33.9, adult: Secondary | ICD-10-CM | POA: Diagnosis not present

## 2020-12-11 DIAGNOSIS — Z8249 Family history of ischemic heart disease and other diseases of the circulatory system: Secondary | ICD-10-CM

## 2020-12-11 DIAGNOSIS — I130 Hypertensive heart and chronic kidney disease with heart failure and stage 1 through stage 4 chronic kidney disease, or unspecified chronic kidney disease: Secondary | ICD-10-CM | POA: Diagnosis present

## 2020-12-11 DIAGNOSIS — Z8673 Personal history of transient ischemic attack (TIA), and cerebral infarction without residual deficits: Secondary | ICD-10-CM | POA: Diagnosis not present

## 2020-12-11 DIAGNOSIS — Z87891 Personal history of nicotine dependence: Secondary | ICD-10-CM

## 2020-12-11 DIAGNOSIS — E1169 Type 2 diabetes mellitus with other specified complication: Secondary | ICD-10-CM | POA: Diagnosis not present

## 2020-12-11 DIAGNOSIS — N179 Acute kidney failure, unspecified: Secondary | ICD-10-CM | POA: Diagnosis present

## 2020-12-11 DIAGNOSIS — Z7982 Long term (current) use of aspirin: Secondary | ICD-10-CM

## 2020-12-11 DIAGNOSIS — Z9114 Patient's other noncompliance with medication regimen: Secondary | ICD-10-CM

## 2020-12-11 DIAGNOSIS — I10 Essential (primary) hypertension: Secondary | ICD-10-CM | POA: Diagnosis present

## 2020-12-11 DIAGNOSIS — J189 Pneumonia, unspecified organism: Secondary | ICD-10-CM | POA: Diagnosis present

## 2020-12-11 DIAGNOSIS — E669 Obesity, unspecified: Secondary | ICD-10-CM | POA: Diagnosis present

## 2020-12-11 DIAGNOSIS — I5032 Chronic diastolic (congestive) heart failure: Secondary | ICD-10-CM | POA: Diagnosis not present

## 2020-12-11 DIAGNOSIS — Z992 Dependence on renal dialysis: Secondary | ICD-10-CM | POA: Diagnosis not present

## 2020-12-11 DIAGNOSIS — Z955 Presence of coronary angioplasty implant and graft: Secondary | ICD-10-CM

## 2020-12-11 DIAGNOSIS — I2510 Atherosclerotic heart disease of native coronary artery without angina pectoris: Secondary | ICD-10-CM | POA: Diagnosis present

## 2020-12-11 DIAGNOSIS — Z951 Presence of aortocoronary bypass graft: Secondary | ICD-10-CM | POA: Diagnosis not present

## 2020-12-11 DIAGNOSIS — R7989 Other specified abnormal findings of blood chemistry: Secondary | ICD-10-CM | POA: Diagnosis not present

## 2020-12-11 DIAGNOSIS — J90 Pleural effusion, not elsewhere classified: Secondary | ICD-10-CM | POA: Diagnosis present

## 2020-12-11 DIAGNOSIS — I255 Ischemic cardiomyopathy: Secondary | ICD-10-CM | POA: Diagnosis not present

## 2020-12-11 DIAGNOSIS — Z86711 Personal history of pulmonary embolism: Secondary | ICD-10-CM | POA: Diagnosis not present

## 2020-12-11 DIAGNOSIS — E1122 Type 2 diabetes mellitus with diabetic chronic kidney disease: Secondary | ICD-10-CM | POA: Diagnosis not present

## 2020-12-11 DIAGNOSIS — R0602 Shortness of breath: Secondary | ICD-10-CM | POA: Diagnosis present

## 2020-12-11 DIAGNOSIS — Z8674 Personal history of sudden cardiac arrest: Secondary | ICD-10-CM

## 2020-12-11 DIAGNOSIS — I700 Atherosclerosis of aorta: Secondary | ICD-10-CM | POA: Diagnosis not present

## 2020-12-11 DIAGNOSIS — Z7902 Long term (current) use of antithrombotics/antiplatelets: Secondary | ICD-10-CM

## 2020-12-11 DIAGNOSIS — R079 Chest pain, unspecified: Secondary | ICD-10-CM | POA: Diagnosis not present

## 2020-12-11 DIAGNOSIS — R609 Edema, unspecified: Secondary | ICD-10-CM | POA: Diagnosis not present

## 2020-12-11 DIAGNOSIS — I25118 Atherosclerotic heart disease of native coronary artery with other forms of angina pectoris: Secondary | ICD-10-CM | POA: Diagnosis not present

## 2020-12-11 DIAGNOSIS — N184 Chronic kidney disease, stage 4 (severe): Secondary | ICD-10-CM | POA: Diagnosis not present

## 2020-12-11 DIAGNOSIS — E785 Hyperlipidemia, unspecified: Secondary | ICD-10-CM | POA: Diagnosis not present

## 2020-12-11 DIAGNOSIS — Z833 Family history of diabetes mellitus: Secondary | ICD-10-CM

## 2020-12-11 DIAGNOSIS — N400 Enlarged prostate without lower urinary tract symptoms: Secondary | ICD-10-CM | POA: Diagnosis present

## 2020-12-11 DIAGNOSIS — E8770 Fluid overload, unspecified: Secondary | ICD-10-CM | POA: Diagnosis present

## 2020-12-11 DIAGNOSIS — Z9111 Patient's noncompliance with dietary regimen: Secondary | ICD-10-CM

## 2020-12-11 DIAGNOSIS — R262 Difficulty in walking, not elsewhere classified: Secondary | ICD-10-CM | POA: Diagnosis not present

## 2020-12-11 DIAGNOSIS — I252 Old myocardial infarction: Secondary | ICD-10-CM | POA: Diagnosis not present

## 2020-12-11 DIAGNOSIS — E1142 Type 2 diabetes mellitus with diabetic polyneuropathy: Secondary | ICD-10-CM | POA: Diagnosis present

## 2020-12-11 DIAGNOSIS — I214 Non-ST elevation (NSTEMI) myocardial infarction: Secondary | ICD-10-CM | POA: Diagnosis not present

## 2020-12-11 LAB — CBC WITH DIFFERENTIAL/PLATELET
Abs Immature Granulocytes: 0.12 10*3/uL — ABNORMAL HIGH (ref 0.00–0.07)
Basophils Absolute: 0.1 10*3/uL (ref 0.0–0.1)
Basophils Relative: 1 %
Eosinophils Absolute: 0.1 10*3/uL (ref 0.0–0.5)
Eosinophils Relative: 1 %
HCT: 35.9 % — ABNORMAL LOW (ref 39.0–52.0)
Hemoglobin: 10.8 g/dL — ABNORMAL LOW (ref 13.0–17.0)
Immature Granulocytes: 1 %
Lymphocytes Relative: 17 %
Lymphs Abs: 1.6 10*3/uL (ref 0.7–4.0)
MCH: 33 pg (ref 26.0–34.0)
MCHC: 30.1 g/dL (ref 30.0–36.0)
MCV: 109.8 fL — ABNORMAL HIGH (ref 80.0–100.0)
Monocytes Absolute: 0.8 10*3/uL (ref 0.1–1.0)
Monocytes Relative: 9 %
Neutro Abs: 6.5 10*3/uL (ref 1.7–7.7)
Neutrophils Relative %: 71 %
Platelets: 203 10*3/uL (ref 150–400)
RBC: 3.27 MIL/uL — ABNORMAL LOW (ref 4.22–5.81)
RDW: 14.3 % (ref 11.5–15.5)
WBC: 9.2 10*3/uL (ref 4.0–10.5)
nRBC: 0 % (ref 0.0–0.2)

## 2020-12-11 LAB — BASIC METABOLIC PANEL
Anion gap: 6 (ref 5–15)
BUN: 33 mg/dL — ABNORMAL HIGH (ref 8–23)
CO2: 26 mmol/L (ref 22–32)
Calcium: 9.1 mg/dL (ref 8.9–10.3)
Chloride: 110 mmol/L (ref 98–111)
Creatinine, Ser: 2.43 mg/dL — ABNORMAL HIGH (ref 0.61–1.24)
GFR, Estimated: 27 mL/min — ABNORMAL LOW (ref >60)
Glucose, Bld: 80 mg/dL (ref 70–99)
Potassium: 4.2 mmol/L (ref 3.5–5.1)
Sodium: 142 mmol/L (ref 135–145)

## 2020-12-11 LAB — TROPONIN I (HIGH SENSITIVITY)
Troponin I (High Sensitivity): 22 ng/L — ABNORMAL HIGH (ref <18)
Troponin I (High Sensitivity): 21 ng/L — ABNORMAL HIGH (ref <18)

## 2020-12-11 LAB — BRAIN NATRIURETIC PEPTIDE: B Natriuretic Peptide: 184.8 pg/mL — ABNORMAL HIGH (ref 0.0–100.0)

## 2020-12-11 MED ORDER — FUROSEMIDE 10 MG/ML IJ SOLN
80.0000 mg | Freq: Once | INTRAMUSCULAR | Status: AC
  Administered 2020-12-12: 80 mg via INTRAVENOUS
  Filled 2020-12-11: qty 8

## 2020-12-11 MED ORDER — ALBUTEROL SULFATE HFA 108 (90 BASE) MCG/ACT IN AERS
2.0000 | INHALATION_SPRAY | Freq: Once | RESPIRATORY_TRACT | Status: AC
  Administered 2020-12-12: 2 via RESPIRATORY_TRACT
  Filled 2020-12-11: qty 6.7

## 2020-12-11 NOTE — ED Provider Notes (Signed)
[facility] EMERGENCY DEPARTMENT Provider Note   CSN: [id number] Arrival date & time: 12/11/20  1429     History Chief Complaint  Patient presents with   Shortness of Breath   Cough    Christian [name] is a 75 y.o. male.  Patient with a history of CAD status post CABG, CHF, CKD not on dialysis, previous PE not on anticoagulation here with 3-day history of shortness of breath and "whistling" when he breathes".  Describes feeling short of breath for the past 2 or 3 days especially when he ambulates and exerts himself.  He is having a sleep sitting up which is unusual for him.  Has constant central chest pain that has been constant for the past 3 days as well.  Denies cough, fever, runny nose or sore throat.  Chronic leg swelling is unchanged.  Still making a lot of urine.  No abdominal pain, nausea or vomiting.  States compliance with his medications though his wife states he has missed doses of his Lasix from time to time. Contrary to triage note he has not been coughing.  He is COVID vaccinated.  He states he is scheduled to be starting dialysis soon  The history is provided by the patient.  Shortness of Breath Associated symptoms: chest pain and cough   Associated symptoms: no abdominal pain, no fever, no headaches and no vomiting   Cough Associated symptoms: chest pain and shortness of breath   Associated symptoms: no fever, no headaches, no myalgias and no rhinorrhea       Past Medical History:  Diagnosis Date   Anemia    low iron   BPH (benign prostatic hypertrophy)    CHF (congestive heart failure) (HCC)    Chronic kidney disease    Coronary artery disease    Dry eyes left   Hiatal hernia    Hx of CABG 08/15/2019: x 4 using bilateral IMAs and left radial artery .  LIMA TO LAD, RIMA TO PDA, RADIAL ARTERY TO CIRC AND SEQUENTIALLY TO OM1. 08/15/2019   Hyperlipidemia    Hypertension    Incomplete bladder emptying    Myocardial infarction ([place]) 08/09/2019    Nocturia    PE (pulmonary thromboembolism) ([place]) 08/09/2019   small RLL PE 08/09/19   Pneumonia    as a child   Pre-diabetes    Problems with swallowing pt states test at [facility] approx 2012 shows a gastric valve  dysfunction--  eats small bites and drink liquids slowly   SOB (shortness of breath) on exertion     Patient Active Problem List   Diagnosis Date Noted   Pulmonary edema 10/03/2020   Sleep difficulties 05/30/2020   Poor appetite 05/30/2020   Anemia due to blood loss 04/24/2020   CKD (chronic kidney disease) stage 4, GFR 15-29 ml/min (HCC) 03/27/2020   Aortic atherosclerosis ([place]) 03/27/2020   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   Pruritus, unspecified 11/28/2019   Unspecified protein-calorie malnutrition ([place]) 10/27/2019   Secondary hyperparathyroidism of renal origin ([place]) 10/17/2019   Coronary artery disease 08/15/2019   Hx of CABG 08/15/2019: x 4 using bilateral IMAs and left radial artery .  LIMA TO LAD, RIMA TO PDA, RADIAL ARTERY TO CIRC AND SEQUENTIALLY TO OM1. 08/15/2019   Ischemic cardiomyopathy    Swelling of lower extremity    History of coronary artery stent placement    Pulmonary embolism ([place]) 08/09/2019   Elevated liver enzymes 08/09/2019   Left leg weakness 11/23/2018  CAD (coronary artery disease) 07/30/2018   Other fatigue 10/19/2017   Routine general medical examination at a health care facility 08/30/2016   Diabetes mellitus with polyneuropathy ([place]) 10/10/2014   Benign prostatic hyperplasia 09/27/2014   Stable angina pectoris ([place]) 02/27/2014   S/P PTCA (percutaneous transluminal coronary angioplasty) 02/27/2014   Hyperlipidemia associated with type 2 diabetes mellitus ([place]) 11/12/2009   MULTIPLE SCLEROSIS 11/12/2009   HTN (hypertension) 11/12/2009    Past Surgical History:  Procedure Laterality Date   APPLICATION OF WOUND VAC N/A 08/24/2019   Procedure: APPLICATION OF WOUND VAC;  Surgeon: [name], MD;  Location: [place];   Service: Thoracic;  Laterality: N/A;   APPLICATION OF WOUND VAC  08/29/2019   Procedure: Wound Vac change;  Surgeon: [name], MD;  Location: [place] OR;  Service: Open Heart Surgery;;   APPLICATION OF WOUND VAC N/A 09/04/2019   Procedure: Application Of Wound Vac;  Surgeon: Grace Kasper, MD;  Location: [place];  Service: Open Heart Surgery;  Laterality: N/A;   APPLICATION OF WOUND VAC N/A 09/06/2019   Procedure: APPLICATION OF ACELL, APPLICATION OF WOUND VAC USING PREVENA INCISIONAL  DRESSING;  Surgeon: Wallace Going, DO;  Location: [place];  Service: Plastics;  Laterality: N/A;   AV FISTULA PLACEMENT Left 09/18/2020   Procedure: ARTERIOVENOUS (AV) FISTULA CREATION LEFT VERSUS GRAFT;  Surgeon: Serafina Mitchell, MD;  Location: [place];  Service: Vascular;  Laterality: Left;   [place] Left 10/31/2020   Procedure: LEFT SECOND STAGE [place];  Surgeon: Serafina Mitchell, MD;  Location: [facility];  Service: Vascular;  Laterality: Left;   CARDIAC CATHETERIZATION     CORONARY ARTERY BYPASS GRAFT N/A 08/15/2019   Procedure: CORONARY ARTERY BYPASS GRAFTING (CABG), x 4 using bilateral IMAs and left radial artery .  LIMA TO LAD, RIMA TO PDA, RADIAL ARTERY TO CIRC AND SEQUENTIALLY TO OM1.;  Surgeon: [name], MD;  Location: [place];  Service: Open Heart Surgery;  Laterality: N/A;   CORONARY STENT PLACEMENT  02/27/2014   distal rt/pd coronary       dr Einar Gip   CYSTO/ BLADDER BIOPSY'S/ CAUTHERIZATION  01-14-2004  DR Gaynelle Arabian   EXPLORATION POST OPERATIVE OPEN HEART N/A 08/16/2019   Procedure: Chest Closure S?P CABG WITH APPLICATION OF PREVENA  INCISIONAL WOUND VAC;  Surgeon: [name], MD;  Location: [place];  Service: Open Heart Surgery;  Laterality: N/A;   EXPLORATION POST OPERATIVE OPEN HEART N/A 08/21/2019   Procedure: CHEST WASHOUT S/P OPEN CHEST;  Surgeon: [name], MD;  Location: [place];  Service: Open Heart Surgery;  Laterality: N/A;  Open chest  with Esmark dressing with Ioban sealant coverage.   EXPLORATION POST OPERATIVE OPEN HEART N/A 08/18/2019   Procedure: EXPLORATION POST OPERATIVE OPEN HEART (performed 04/23 on unit);  Surgeon: [name], MD;  Location: [place];  Service: Open Heart Surgery;  Laterality: N/A;   EXPLORATION POST OPERATIVE OPEN HEART N/A 08/24/2019   Procedure: CHEST WASHOUT POST OPERATIVE OPEN HEART;  Surgeon: [name], MD;  Location: [place];  Service: Open Heart Surgery;  Laterality: N/A;   EXPLORATION POST OPERATIVE OPEN HEART N/A 08/29/2019   Procedure: CHEST WOUND WASHOUT POST OPERATIVE OPEN HEART;  Surgeon: [name], MD;  Location: [place];  Service: Open Heart Surgery;  Laterality: N/A;   EXPLORATION POST OPERATIVE OPEN HEART N/A 09/04/2019   Procedure: MEDIASTINAL EXPLORATION WITH STERNAL WOUND IRRIGATION;  Surgeon: Grace Ulices,  MD;  [facility];  Service: Open Heart Surgery;  Laterality: N/A;   EXPLORATION POST OPERATIVE OPEN HEART N/A 09/14/2019   Procedure: EVACUATION OF HEMATOMA;  Surgeon: Grace Evart, MD;  Location: [place];  Service: Open Heart Surgery;  Laterality: N/A;   IR FLUORO GUIDE CV LINE LEFT  09/19/2019   IR GASTROSTOMY TUBE MOD SED  10/04/2019   IR GASTROSTOMY TUBE REMOVAL  11/29/2019   IR PATIENT EVAL TECH 0-60 MINS  09/29/2019   IR US GUIDE VASC ACCESS LEFT  09/19/2019   LAPAROSCOPIC LYSIS OF ADHESIONS N/A 09/06/2019   Procedure: LAPAROSCOPIC OMENTAL HARVEST;  Surgeon: Mickeal Skinner, MD;  Location: [place];  Service: General;  Laterality: N/A;   LEFT HEART CATH AND CORONARY ANGIOGRAPHY N/A 08/10/2019   Procedure: LEFT HEART CATH AND CORONARY ANGIOGRAPHY;  Surgeon: Nigel Mormon, MD;  Location: [place] [facility];  Service: Cardiovascular;  Laterality: N/A;   LEFT HEART CATHETERIZATION WITH CORONARY ANGIOGRAM N/A 02/27/2014   Procedure: LEFT HEART CATHETERIZATION WITH CORONARY ANGIOGRAM;  Surgeon: Laverda Page, MD;  Location[facility] CATH LAB;  Service:  Cardiovascular;  Laterality: N/A;   MEDIASTINAL EXPLORATION N/A 09/06/2019   Procedure: MEDIASTINAL EXPLORATION;  Surgeon: Grace Weiland, MD;  Location: [place];  Service: Thoracic;  Laterality: N/A;   PECTORALIS FLAP  09/06/2019   Procedure: Pectoralis ADVANCEMENT Flap;  Surgeon: Wallace Going, DO;  Location: [place];  Service: Plastics;;   PERCUTANEOUS CORONARY STENT INTERVENTION (PCI-S)  02/27/2014   Procedure: PERCUTANEOUS CORONARY STENT INTERVENTION (PCI-S);  Surgeon: Laverda Page, MD;  Location: [facility] CATH LAB;  Service: Cardiovascular;;  rt PDA  3.0/28mm Promus stent   RADIAL ARTERY HARVEST Left 08/15/2019   Procedure: Radial Artery Harvest;  Surgeon: [name], MD;  Location: [place];  Service: Open Heart Surgery;  Laterality: Left;   RIB PLATING N/A 09/06/2019   Procedure: STERNAL PLATING;  Surgeon: Grace Emelio, MD;  Location: [place];  Service: Thoracic;  Laterality: N/A;   TEE WITHOUT CARDIOVERSION N/A 08/15/2019   Procedure: TRANSESOPHAGEAL ECHOCARDIOGRAM (TEE);  Surgeon: [name], MD;  Location: [place];  Service: Open Heart Surgery;  Laterality: N/A;   TRACHEOSTOMY TUBE PLACEMENT  08/29/2019   Procedure: Tracheostomy;  Surgeon: [name], MD;  [facility];  Service: Open Heart Surgery; Decannulated 6/10/20211   TRANSURETHRAL RESECTION OF PROSTATE  04/04/2012   Procedure: TRANSURETHRAL RESECTION OF THE PROSTATE WITH GYRUS INSTRUMENTS;  Surgeon: Ailene Rud, MD;  Location: [facility];  Service: Urology;  Laterality: N/A;   TRANSURETHRAL RESECTION OF PROSTATE N/A 09/27/2014   Procedure: TRANSURETHRAL RESECTION OF THE PROSTATE ;  Surgeon: Carolan Clines, MD;  Location: [facility];  Service: Urology;  Laterality: N/A;   UPPER GASTROINTESTINAL ENDOSCOPY         Family History  Problem Relation Age of Onset   Diabetes Mother    Hypertension Mother    Diabetes Father    Diabetes Brother    Hypertension Brother    Bone cancer  Brother    Diabetes Brother     Social History   Tobacco Use   Smoking status: Former    Packs/day: 0.50    Years: 20.00    Pack years: 10.00    Types: Cigarettes    Quit date: 1975    Years since quitting: 47.6   Smokeless tobacco: Never  Vaping Use   Vaping Use: Never used  Substance Use Topics   Alcohol  use: Not Currently    Comment: very rarely - every now and then 1 drink/1 year if that   Drug use: No    Home Medications Prior to Admission medications   Medication Sig Start Date End Date Taking? Authorizing Provider  aspirin EC 81 MG tablet Take 81 mg by mouth daily.    [provider]  atorvastatin (LIPITOR) 20 MG tablet Take 1 tablet (20 mg total) by mouth every evening. 07/18/20   Adrian Prows, MD  clonazePAM (KLONOPIN) 0.5 MG disintegrating tablet TAKE 1 TABLET BY MOUTH TWICE A DAY 10/22/20   Hoyt Koch, MD  clopidogrel (PLAVIX) 75 MG tablet TAKE 1 TABLET BY MOUTH EVERY DAY 12/11/20   Adrian Prows, MD  cromolyn (OPTICROM) 4 % ophthalmic solution Place 1 drop into both eyes every Wednesday. At bedtime 12/08/19   [provider]  [employer] ACETAMINOPHEN 325 MG tablet TAKE 2 CAPS BY MOUTH EVERY 6 HOURS AS NEEDED FOR PAIN FOR TEMP 100F OR ABOVE 02/23/20   Hoyt Koch, MD  [employer] SENNA PLUS 8.6-50 MG tablet TAKE 2 TABLETS BY MOUTH AT BEDTIME FOR CONSTIPATION 11/04/20   Hoyt Koch, MD  ferrous sulfate 325 (65 FE) MG tablet Take 325 mg by mouth daily with breakfast.    [provider]  furosemide (LASIX) 80 MG tablet Take 1.5 tablets (120 mg total) by mouth 2 (two) times daily with breakfast and lunch. 10/07/20 11/20/20  Nita Sells, MD  hydrALAZINE (APRESOLINE) 25 MG tablet Take 2 tablets (50 mg total) by mouth 3 (three) times daily. 10/14/20 10/09/21  Cantwell, Celeste C, PA-C  isosorbide dinitrate (ISORDIL) 30 MG tablet TAKE 1 TABLET BY MOUTH THREE TIMES A DAY 11/25/20   Cantwell, Celeste C, PA-C  metoprolol succinate (TOPROL-XL)  50 MG 24 hr tablet TAKE 3 TABLETS BY MOUTH EVERY DAY 11/22/20   Adrian Prows, MD  multivitamin (RENA-VIT) TABS tablet Take 1 tablet by mouth at bedtime. 10/16/19   Barrett, Erin R, PA-C  nitroGLYCERIN (NITROSTAT) 0.4 MG SL tablet Place 0.4 mg under the tongue every 5 (five) minutes as needed for chest pain. 10/25/19   [provider]  oxyCODONE-acetaminophen (PERCOCET) 5-325 MG tablet Take 1 tablet by mouth every 6 (six) hours as needed for severe pain. 10/31/20 10/31/21  Ulyses Amor, PA-C  potassium chloride (KLOR-CON) 10 MEQ tablet Take 10 mEq by mouth daily. 10/25/20   [provider]  PREVIDENT 5000 BOOSTER PLUS 1.1 % PSTE Take 1 application by mouth every evening. 06/14/18   [provider]  sertraline (ZOLOFT) 25 MG tablet TAKE 1 TABLET ONE TIME A DAY 07/16/20   Hoyt Koch, MD  silodosin (RAPAFLO) 8 MG CAPS capsule TAKE 1 CAPSULE BY MOUTH EVERYDAY AT BEDTIME 09/24/20   McKenzie, Candee Furbish, MD  Vitamin D, Ergocalciferol, (DRISDOL) 1.25 MG (50000 UNIT) CAPS capsule Take 1 capsule (50,000 Units total) by mouth every 7 (seven) days. 06/28/20   Hoyt Koch, MD    Allergies    Patient has no known allergies.  Review of Systems   Review of Systems  Constitutional:  Negative for activity change, appetite change and fever.  HENT:  Negative for congestion and rhinorrhea.   Eyes:  Negative for visual disturbance.  Respiratory:  Positive for cough, chest tightness and shortness of breath.   Cardiovascular:  Positive for chest pain and leg swelling.  Gastrointestinal:  Negative for abdominal pain, nausea and vomiting.  Genitourinary:  Negative for dysuria and hematuria.  Musculoskeletal:  Negative for arthralgias and myalgias.  Skin:  Negative for wound.  Neurological:  Negative for dizziness, weakness and headaches.   all other systems are negative except as noted in the HPI and PMH.   Physical Exam Updated Vital Signs BP (!) 206/125   Pulse 80   Temp  98.4 F (36.9 C) (Oral)   Resp 20   SpO2 97%   Physical Exam Vitals and nursing note reviewed.  Constitutional:      General: He is not in acute distress.    Appearance: He is well-developed.     Comments: Dyspnea with conversation  HENT:     Head: Normocephalic and atraumatic.     Mouth/Throat:     Pharynx: No oropharyngeal exudate.  Eyes:     Conjunctiva/sclera: Conjunctivae normal.     Pupils: Pupils are equal, round, and reactive to light.  Neck:     Comments: No meningismus. Cardiovascular:     Rate and Rhythm: Normal rate and regular rhythm.     Heart sounds: Normal heart sounds. No murmur heard. Pulmonary:     Effort: Pulmonary effort is normal. No respiratory distress.     Breath sounds: Wheezing present.     Comments: Diminished breath sounds, expiratory wheezing bilateral Abdominal:     Palpations: Abdomen is soft.     Tenderness: There is no abdominal tenderness. There is no guarding or rebound.  Musculoskeletal:        General: No tenderness. Normal range of motion.     Cervical back: Normal range of motion and neck supple.     Right lower leg: Edema present.     Left lower leg: Edema present.     Comments: +2 DP pulses bilaterally  Skin:    General: Skin is warm.  Neurological:     Mental Status: He is alert and oriented to person, place, and time.     Cranial Nerves: No cranial nerve deficit.     Motor: No abnormal muscle tone.     Coordination: Coordination normal.     Comments:  5/5 strength throughout. CN 2-12 intact.Equal grip strength.   Psychiatric:        Behavior: Behavior normal.    ED Results / Procedures / Treatments   Labs (all labs ordered are listed, but only abnormal results are displayed) Labs Reviewed  BASIC METABOLIC PANEL - Abnormal; Notable for the following components:      Result Value   BUN 33 (*)    Creatinine, Ser 2.43 (*)    GFR, Estimated 27 (*)    All other components within normal limits  CBC WITH  DIFFERENTIAL/PLATELET - Abnormal; Notable for the following components:   RBC 3.27 (*)    Hemoglobin 10.8 (*)    HCT 35.9 (*)    MCV 109.8 (*)    Abs Immature Granulocytes 0.12 (*)    All other components within normal limits  BRAIN NATRIURETIC PEPTIDE - Abnormal; Notable for the following components:   B Natriuretic Peptide 184.8 (*)    All other components within normal limits  TROPONIN I (HIGH SENSITIVITY) - Abnormal; Notable for the following components:   Troponin I (High Sensitivity) 22 (*)    All other components within normal limits  TROPONIN I (HIGH SENSITIVITY) - Abnormal; Notable for the following components:   Troponin I (High Sensitivity) 21 (*)    All other components within normal limits  RESP PANEL BY RT-PCR (FLU A&B, COVID) [id number]    EKG EKG Interpretation  Date/Time:  Wednesday December 11 2020 15:18:26 EDT Ventricular Rate:  88 PR Interval:  118 QRS Duration: 94 QT Interval:  380 QTC Calculation: 459 R Axis:   64 Text Interpretation: Normal sinus rhythm Cannot rule out Anterior infarct , age undetermined Abnormal ECG No significant change was found \ Confirmed by Ezequiel Essex [phone]) on 12/11/2020 11:20:21 PM  Radiology DG Chest 2 View  Result Date: 12/11/2020 CLINICAL DATA:  Shortness of breath and chest pain in a 75 year old male. EXAM: CHEST - 2 VIEW COMPARISON:  October 06, 2020. FINDINGS: Post median sternotomy for CABG. Cardiomediastinal contours with stable cardiac enlargement as before. Slightly diminished but persistent LEFT pleural effusion. Persistent LEFT basilar opacity in the retrocardiac region. No visible pneumothorax. No new area of consolidation. Trace RIGHT effusion. On limited assessment no acute skeletal process. IMPRESSION: Persistent but slightly diminished LEFT pleural effusion and LEFT basilar opacity. Electronically Signed   By: Zetta Bills M.D.   On: 12/11/2020 16:18   CT Chest Wo Contrast  Result Date: 12/12/2020 CLINICAL DATA:   Shortness of breath. EXAM: CT CHEST WITHOUT CONTRAST TECHNIQUE: Multidetector CT imaging of the chest was performed following the standard protocol without IV contrast. COMPARISON:  Chest CT dated 09/18/2019 and radiograph dated 12/11/2020. FINDINGS: Evaluation of this exam is limited in the absence of intravenous contrast. Cardiovascular: There is no cardiomegaly or pericardial effusion. Three-vessel coronary vascular calcification. There is moderate atherosclerotic calcification of the thoracic aorta. The central pulmonary arteries are grossly unremarkable. Mediastinum/Nodes: No hilar or mediastinal adenopathy. The esophagus is grossly unremarkable. No mediastinal fluid collection. Lungs/Pleura: Small, chronic appearing bilateral pleural effusions, left greater than right. There is partial consolidative changes of the left lung base. Pneumonia is not excluded. There is however a cluster of ground-glass nodular density in the right lower lobe most consistent with atypical pneumonia. No pneumothorax. The central airways remain patent. Upper Abdomen: Bilateral adrenal thickening/hyperplasia versus adenoma. Musculoskeletal: Median sternotomy. Degenerative changes of the shoulders bilaterally. No acute osseous pathology. IMPRESSION: 1. Cluster of ground-glass nodular density in the right lower lobe most consistent with atypical pneumonia. Small, chronic appearing bilateral pleural effusions, left greater than right. 2. Partial consolidative changes of the left lung base. 3. Aortic Atherosclerosis (ICD10-I70.0). Electronically Signed   By: Anner Crete M.D.   On: 12/12/2020 01:03    Procedures Procedures   Medications Ordered in ED Medications  albuterol (VENTOLIN HFA) 108 (90 Base) MCG/ACT inhaler 2 puff (has no administration in time range)  furosemide (LASIX) injection 80 mg (has no administration in time range)    ED Course  I have reviewed the triage vital signs and the nursing notes.  Pertinent  labs & imaging results that were available during my care of the patient were reviewed by me and considered in my medical decision making (see chart for details).    MDM Rules/Calculators/A&P                          Several days of dyspnea with exertion, central chest pain and coughing and wheezing.  No hypoxia at rest but is wheezing.  Chest x-ray concerning for pleural effusion on the left similar to June.  EKG without acute ischemia.  Troponins are flat. His description for chest pain is atypical for ACS and troponins are flat. Per wife at bedside he is taking 80 mg of Lasix twice a day but does miss doses from time to time  Patient with tachypnea and increased work  of breathing but no hypoxia with ambulation.  CT scan shows groundglass opacities of right base as well as bilateral pleural effusions concerning for volume overload  Patient given IV Lasix in the ED with minimal urine output.  He has not had any cough or fever to suggest pneumonia but CT scan was read as such.  He will be given empiric antibiotics and IV diuretics.  Given his ongoing dyspnea would benefit from admission for diuresis. Admission discussed with Dr. Alcario Drought. Final Clinical Impression(s) / ED Diagnoses Final diagnoses:  Pleural effusion  Community acquired pneumonia, unspecified laterality    Rx / DC Orders ED Discharge Orders     None        Lusero Nordlund, Annie Main, MD 12/12/20 0740

## 2020-12-11 NOTE — ED Provider Notes (Signed)
Emergency Medicine Provider Triage Evaluation Note  Christian [name] , a 75 y.o. male  was evaluated in triage.  Pt complains of chest pain, congestion, cough for the past few days.  Intermittent left lower extremity edema for which she believes he takes medication for.  Denies any fevers.  Review of Systems  Positive: Chest pain, cough, shortness of breath Negative: Fever  Physical Exam  BP 123/76 (BP Location: Right Arm)   Pulse 91   Temp 98.4 F (36.9 C) (Oral)   Resp 16   SpO2 97%  Gen:   Awake, no distress   Resp:  Normal effort  MSK:   Moves extremities without difficulty  Other:  Slight end expiratory wheezing noted on bilateral lung fields, edema noted to left lower extremity  Medical Decision Making  Medically screening exam initiated at 3:21 PM.  Appropriate orders placed.  Christian [name] was informed that the remainder of the evaluation will be completed by another provider, this initial triage assessment does not replace that evaluation, and the importance of remaining in the ED until their evaluation is complete.  Work-up initiated   Delia Heady, PA-C 12/11/20 1522    Blanchie Dessert, MD 12/14/20 [phone]

## 2020-12-11 NOTE — ED Triage Notes (Signed)
Pt reports sob, congestion and cough for the past few days. Pt also reports some chest pain when he breathes. Audible wheezing noted in triage. Otherwise resp e.u. Has had 4 covid vaccines.

## 2020-12-11 NOTE — ED Notes (Signed)
Pt transported to Xray. 

## 2020-12-12 ENCOUNTER — Emergency Department (HOSPITAL_COMMUNITY): Payer: Medicare Other

## 2020-12-12 ENCOUNTER — Observation Stay (HOSPITAL_COMMUNITY): Payer: Medicare Other

## 2020-12-12 DIAGNOSIS — Z86711 Personal history of pulmonary embolism: Secondary | ICD-10-CM | POA: Diagnosis not present

## 2020-12-12 DIAGNOSIS — R609 Edema, unspecified: Secondary | ICD-10-CM

## 2020-12-12 DIAGNOSIS — E1169 Type 2 diabetes mellitus with other specified complication: Secondary | ICD-10-CM | POA: Diagnosis present

## 2020-12-12 DIAGNOSIS — N184 Chronic kidney disease, stage 4 (severe): Secondary | ICD-10-CM | POA: Diagnosis present

## 2020-12-12 DIAGNOSIS — R0602 Shortness of breath: Secondary | ICD-10-CM

## 2020-12-12 DIAGNOSIS — E1122 Type 2 diabetes mellitus with diabetic chronic kidney disease: Secondary | ICD-10-CM | POA: Diagnosis present

## 2020-12-12 DIAGNOSIS — I252 Old myocardial infarction: Secondary | ICD-10-CM | POA: Diagnosis not present

## 2020-12-12 DIAGNOSIS — E785 Hyperlipidemia, unspecified: Secondary | ICD-10-CM | POA: Diagnosis present

## 2020-12-12 DIAGNOSIS — R7989 Other specified abnormal findings of blood chemistry: Secondary | ICD-10-CM | POA: Diagnosis present

## 2020-12-12 DIAGNOSIS — E8779 Other fluid overload: Secondary | ICD-10-CM

## 2020-12-12 DIAGNOSIS — N400 Enlarged prostate without lower urinary tract symptoms: Secondary | ICD-10-CM | POA: Diagnosis present

## 2020-12-12 DIAGNOSIS — J189 Pneumonia, unspecified organism: Secondary | ICD-10-CM

## 2020-12-12 DIAGNOSIS — I255 Ischemic cardiomyopathy: Secondary | ICD-10-CM | POA: Diagnosis present

## 2020-12-12 DIAGNOSIS — E8770 Fluid overload, unspecified: Secondary | ICD-10-CM | POA: Diagnosis present

## 2020-12-12 DIAGNOSIS — Z6833 Body mass index (BMI) 33.0-33.9, adult: Secondary | ICD-10-CM | POA: Diagnosis not present

## 2020-12-12 DIAGNOSIS — Z951 Presence of aortocoronary bypass graft: Secondary | ICD-10-CM | POA: Diagnosis not present

## 2020-12-12 DIAGNOSIS — Z9079 Acquired absence of other genital organ(s): Secondary | ICD-10-CM | POA: Diagnosis not present

## 2020-12-12 DIAGNOSIS — I130 Hypertensive heart and chronic kidney disease with heart failure and stage 1 through stage 4 chronic kidney disease, or unspecified chronic kidney disease: Secondary | ICD-10-CM | POA: Diagnosis present

## 2020-12-12 DIAGNOSIS — I10 Essential (primary) hypertension: Secondary | ICD-10-CM

## 2020-12-12 DIAGNOSIS — E669 Obesity, unspecified: Secondary | ICD-10-CM | POA: Diagnosis present

## 2020-12-12 DIAGNOSIS — Z833 Family history of diabetes mellitus: Secondary | ICD-10-CM | POA: Diagnosis not present

## 2020-12-12 DIAGNOSIS — J90 Pleural effusion, not elsewhere classified: Secondary | ICD-10-CM | POA: Diagnosis present

## 2020-12-12 DIAGNOSIS — Z8673 Personal history of transient ischemic attack (TIA), and cerebral infarction without residual deficits: Secondary | ICD-10-CM | POA: Diagnosis not present

## 2020-12-12 DIAGNOSIS — I2510 Atherosclerotic heart disease of native coronary artery without angina pectoris: Secondary | ICD-10-CM | POA: Diagnosis present

## 2020-12-12 DIAGNOSIS — Z79899 Other long term (current) drug therapy: Secondary | ICD-10-CM | POA: Diagnosis not present

## 2020-12-12 DIAGNOSIS — Z20822 Contact with and (suspected) exposure to covid-19: Secondary | ICD-10-CM | POA: Diagnosis present

## 2020-12-12 DIAGNOSIS — Z8674 Personal history of sudden cardiac arrest: Secondary | ICD-10-CM | POA: Diagnosis not present

## 2020-12-12 DIAGNOSIS — E1142 Type 2 diabetes mellitus with diabetic polyneuropathy: Secondary | ICD-10-CM | POA: Diagnosis present

## 2020-12-12 DIAGNOSIS — Z8249 Family history of ischemic heart disease and other diseases of the circulatory system: Secondary | ICD-10-CM | POA: Diagnosis not present

## 2020-12-12 DIAGNOSIS — I5032 Chronic diastolic (congestive) heart failure: Secondary | ICD-10-CM | POA: Diagnosis present

## 2020-12-12 HISTORY — DX: Pneumonia, unspecified organism: J18.9

## 2020-12-12 LAB — RESP PANEL BY RT-PCR (FLU A&B, COVID) ARPGX2
Influenza A by PCR: NEGATIVE
Influenza B by PCR: NEGATIVE
SARS Coronavirus 2 by RT PCR: NEGATIVE

## 2020-12-12 LAB — HIV ANTIBODY (ROUTINE TESTING W REFLEX): HIV Screen 4th Generation wRfx: NONREACTIVE

## 2020-12-12 LAB — CBC
HCT: 38.1 % — ABNORMAL LOW (ref 39.0–52.0)
Hemoglobin: 11.7 g/dL — ABNORMAL LOW (ref 13.0–17.0)
MCH: 33 pg (ref 26.0–34.0)
MCHC: 30.7 g/dL (ref 30.0–36.0)
MCV: 107.3 fL — ABNORMAL HIGH (ref 80.0–100.0)
Platelets: 218 10*3/uL (ref 150–400)
RBC: 3.55 MIL/uL — ABNORMAL LOW (ref 4.22–5.81)
RDW: 14.4 % (ref 11.5–15.5)
WBC: 8.4 10*3/uL (ref 4.0–10.5)
nRBC: 0 % (ref 0.0–0.2)

## 2020-12-12 LAB — BASIC METABOLIC PANEL
Anion gap: 14 (ref 5–15)
BUN: 32 mg/dL — ABNORMAL HIGH (ref 8–23)
CO2: 23 mmol/L (ref 22–32)
Calcium: 9.8 mg/dL (ref 8.9–10.3)
Chloride: 106 mmol/L (ref 98–111)
Creatinine, Ser: 2.46 mg/dL — ABNORMAL HIGH (ref 0.61–1.24)
GFR, Estimated: 27 mL/min — ABNORMAL LOW (ref >60)
Glucose, Bld: 96 mg/dL (ref 70–99)
Potassium: 3.9 mmol/L (ref 3.5–5.1)
Sodium: 143 mmol/L (ref 135–145)

## 2020-12-12 LAB — TROPONIN I (HIGH SENSITIVITY): Troponin I (High Sensitivity): 21 ng/L — ABNORMAL HIGH (ref <18)

## 2020-12-12 LAB — LACTIC ACID, PLASMA: Lactic Acid, Venous: 1.2 mmol/L (ref 0.5–1.9)

## 2020-12-12 LAB — D-DIMER, QUANTITATIVE: D-Dimer, Quant: 0.91 ug/mL-FEU — ABNORMAL HIGH (ref 0.00–0.50)

## 2020-12-12 LAB — PROCALCITONIN: Procalcitonin: <0.1 ng/mL

## 2020-12-12 MED ORDER — HYDRALAZINE HCL 50 MG PO TABS
50.0000 mg | ORAL_TABLET | Freq: Three times a day (TID) | ORAL | Status: DC
  Administered 2020-12-12 – 2020-12-16 (×13): 50 mg via ORAL
  Filled 2020-12-12: qty 2
  Filled 2020-12-12 (×12): qty 1

## 2020-12-12 MED ORDER — CROMOLYN SODIUM 4 % OP SOLN
1.0000 [drp] | OPHTHALMIC | Status: DC
  Administered 2020-12-12: 1 [drp] via OPHTHALMIC
  Filled 2020-12-12: qty 10

## 2020-12-12 MED ORDER — HEPARIN SODIUM (PORCINE) 5000 UNIT/ML IJ SOLN
5000.0000 [IU] | Freq: Three times a day (TID) | INTRAMUSCULAR | Status: DC
  Administered 2020-12-12 – 2020-12-16 (×13): 5000 [IU] via SUBCUTANEOUS
  Filled 2020-12-12 (×13): qty 1

## 2020-12-12 MED ORDER — SODIUM CHLORIDE 0.9 % IV SOLN
1.0000 g | Freq: Once | INTRAVENOUS | Status: AC
  Administered 2020-12-12: 1 g via INTRAVENOUS
  Filled 2020-12-12: qty 10

## 2020-12-12 MED ORDER — CLONAZEPAM 0.5 MG PO TABS
0.5000 mg | ORAL_TABLET | Freq: Two times a day (BID) | ORAL | Status: DC
  Administered 2020-12-12 – 2020-12-16 (×10): 0.5 mg via ORAL
  Filled 2020-12-12 (×10): qty 1

## 2020-12-12 MED ORDER — ISOSORBIDE DINITRATE 30 MG PO TABS
30.0000 mg | ORAL_TABLET | Freq: Two times a day (BID) | ORAL | Status: DC
  Administered 2020-12-12 – 2020-12-16 (×10): 30 mg via ORAL
  Filled 2020-12-12 (×13): qty 1

## 2020-12-12 MED ORDER — FUROSEMIDE 20 MG PO TABS: 80.0000 mg | ORAL_TABLET | ORAL | Status: DC

## 2020-12-12 MED ORDER — RENA-VITE PO TABS
1.0000 | ORAL_TABLET | Freq: Every day | ORAL | Status: DC
  Administered 2020-12-12 – 2020-12-15 (×4): 1 via ORAL
  Filled 2020-12-12 (×5): qty 1

## 2020-12-12 MED ORDER — FERROUS SULFATE 325 (65 FE) MG PO TABS
325.0000 mg | ORAL_TABLET | Freq: Every day | ORAL | Status: DC
  Administered 2020-12-12 – 2020-12-16 (×5): 325 mg via ORAL
  Filled 2020-12-12 (×5): qty 1

## 2020-12-12 MED ORDER — LABETALOL HCL 5 MG/ML IV SOLN: 5.0000 mg | INTRAVENOUS | Status: DC | PRN

## 2020-12-12 MED ORDER — TAMSULOSIN HCL 0.4 MG PO CAPS
0.4000 mg | ORAL_CAPSULE | Freq: Every day | ORAL | Status: DC
  Administered 2020-12-12 – 2020-12-15 (×5): 0.4 mg via ORAL
  Filled 2020-12-12 (×5): qty 1

## 2020-12-12 MED ORDER — SODIUM CHLORIDE 0.9 % IV SOLN
500.0000 mg | Freq: Once | INTRAVENOUS | Status: AC
  Administered 2020-12-12: 500 mg via INTRAVENOUS
  Filled 2020-12-12: qty 500

## 2020-12-12 MED ORDER — METOPROLOL SUCCINATE ER 25 MG PO TB24
50.0000 mg | ORAL_TABLET | Freq: Every day | ORAL | Status: DC
  Administered 2020-12-12 – 2020-12-16 (×5): 50 mg via ORAL
  Filled 2020-12-12 (×5): qty 2

## 2020-12-12 MED ORDER — ATORVASTATIN CALCIUM 10 MG PO TABS
20.0000 mg | ORAL_TABLET | Freq: Every evening | ORAL | Status: DC
  Administered 2020-12-12 – 2020-12-15 (×4): 20 mg via ORAL
  Filled 2020-12-12 (×4): qty 2

## 2020-12-12 MED ORDER — SERTRALINE HCL 50 MG PO TABS
25.0000 mg | ORAL_TABLET | Freq: Every day | ORAL | Status: DC
  Administered 2020-12-12 – 2020-12-16 (×5): 25 mg via ORAL
  Filled 2020-12-12 (×6): qty 1

## 2020-12-12 MED ORDER — SODIUM CHLORIDE 0.9 % IV SOLN
2.0000 g | INTRAVENOUS | Status: DC
  Administered 2020-12-13 – 2020-12-15 (×3): 2 g via INTRAVENOUS
  Filled 2020-12-12 (×3): qty 20

## 2020-12-12 MED ORDER — POTASSIUM CHLORIDE CRYS ER 10 MEQ PO TBCR
10.0000 meq | EXTENDED_RELEASE_TABLET | Freq: Every day | ORAL | Status: DC
  Administered 2020-12-12 – 2020-12-16 (×5): 10 meq via ORAL
  Filled 2020-12-12 (×5): qty 1

## 2020-12-12 MED ORDER — SENNOSIDES-DOCUSATE SODIUM 8.6-50 MG PO TABS
1.0000 | ORAL_TABLET | Freq: Every day | ORAL | Status: DC
  Administered 2020-12-13 – 2020-12-15 (×3): 1 via ORAL
  Filled 2020-12-12 (×4): qty 1

## 2020-12-12 MED ORDER — ASPIRIN EC 81 MG PO TBEC
81.0000 mg | DELAYED_RELEASE_TABLET | Freq: Every day | ORAL | Status: DC
  Administered 2020-12-12 – 2020-12-16 (×5): 81 mg via ORAL
  Filled 2020-12-12 (×5): qty 1

## 2020-12-12 MED ORDER — CLOPIDOGREL BISULFATE 75 MG PO TABS
75.0000 mg | ORAL_TABLET | Freq: Every day | ORAL | Status: DC
  Administered 2020-12-12 – 2020-12-16 (×5): 75 mg via ORAL
  Filled 2020-12-12 (×5): qty 1

## 2020-12-12 MED ORDER — FUROSEMIDE 10 MG/ML IJ SOLN
80.0000 mg | Freq: Two times a day (BID) | INTRAMUSCULAR | Status: DC
  Administered 2020-12-12 – 2020-12-16 (×8): 80 mg via INTRAVENOUS
  Filled 2020-12-12 (×8): qty 8

## 2020-12-12 MED ORDER — ALBUTEROL SULFATE (2.5 MG/3ML) 0.083% IN NEBU
3.0000 mg | INHALATION_SOLUTION | RESPIRATORY_TRACT | Status: DC | PRN
  Filled 2020-12-12: qty 6

## 2020-12-12 MED ORDER — SODIUM CHLORIDE 0.9 % IV SOLN
500.0000 mg | INTRAVENOUS | Status: DC
  Administered 2020-12-13 – 2020-12-15 (×3): 500 mg via INTRAVENOUS
  Filled 2020-12-12 (×3): qty 500

## 2020-12-12 MED ORDER — OXYCODONE-ACETAMINOPHEN 5-325 MG PO TABS: 1.0000 | ORAL_TABLET | Freq: Four times a day (QID) | ORAL | Status: DC | PRN

## 2020-12-12 NOTE — Care Plan (Signed)
This 75 years old male with PMH significant for HTN, CKD stage IV, prior PE in the setting of CABG, CABG in May 2021, recent hospitalization for CHF exacerbation and was discharged on Lasix 80 mg twice daily.  He presented with complaints of progressive shortness of breath at rest and on exertion associated with intermittent chest pain which gets worse with deep breathing and coughing.  Patient reports all the symptoms started after he got COVID-vaccine ( fourth dose) last week.  Labs include BNP 184, Trop 22> 21, chest x-ray shows left lower lobe effusion.  CT chest chronic appearing bilateral pleural effusion,  cluster of groundglass nodular density in the right lower lobe more consistent with atypical pneumonia.  COVID and flu swab negative. Patient is admitted for community-acquired pneumonia,  started on ceftriaxone and Zithromax.  Patient is also continued on Lasix 80 mg twice daily for CHF.  Renal functions at baseline,  blood pressure is better controlled.  Patient was seen and examined at bedside.

## 2020-12-12 NOTE — ED Notes (Signed)
Report given to Katrina Y, RN   

## 2020-12-12 NOTE — H&P (Signed)
History and Physical    DEARIES MEIKLE WVP:[id number] DOB: [birth year] DOA: 12/11/2020  PCP: Hoyt Koch, MD  Patient coming from: Home  I have personally briefly reviewed patient's old medical records in [place]  Chief Complaint: SOB  HPI: Christian [name] is a 75 y.o. male with medical history significant of HTN, CKD 4, prior PE in setting of CABG, CABG in May 2021.  Pt with h/o return to normal EF as of 2d echo in March 2022.  Pt with CHF admission in June 2022, no echo done that admit, put on 80mg  BID lasix with improvement, discharged.  Pt presents to ED with 2 week h/o progressively worsening SOB, DOE.  Had CP onset over the last couple of days, worse with deep breathing or coughing, onset after he got COVID vaccine dose number 4 last week.  Was having "twinges" before then but became severe after.  Presents to ED for worsening SOB.  No fevers, chills, does have cough.  Does have L > R LE edema, had the same in June as well, improved after diuresis during June admit.   ED Course: BNP 184, not as bad as it was in June (600s).  Trops 22 and 21.  BUN 33 and creat 2.43 (actually slightly better than his usual baseline of 2.8.  WBC nl.  CXR with LLL effusion.  CT chest w/o contrast: 1) small chronic appearing B pleural effusions, L>R 2) partial consolidative changes of L lung base 3) Cluster of ground-glass nodular density in the right lower lobe most consistent with atypical pneumonia.  COVID and flu are neg.   Review of Systems: As per HPI, otherwise all review of systems negative.  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  LIMA TO LAD, RIMA TO PDA, RADIAL ARTERY TO CIRC AND  SEQUENTIALLY TO OM1. 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--  eats small bites and drink liquids slowly   SOB (shortness of breath) on exertion     Past Surgical History:  Procedure Laterality Date   APPLICATION OF WOUND VAC N/A 08/24/2019   Procedure: APPLICATION OF WOUND VAC;  Surgeon: [name], MD;  Location: [place];  Service: Thoracic;  Laterality: N/A;   APPLICATION OF WOUND VAC  08/29/2019   Procedure: Wound Vac change;  Surgeon: [name], MD;  Location: [place] OR;  Service: Open Heart Surgery;;   APPLICATION OF WOUND VAC N/A 09/04/2019   Procedure: Application Of Wound Vac;  Surgeon: Grace Cashmere, MD;  Location: [place];  Service: Open Heart Surgery;  Laterality: N/A;   APPLICATION OF WOUND VAC N/A 09/06/2019   Procedure: APPLICATION OF ACELL, APPLICATION OF WOUND VAC USING PREVENA INCISIONAL  DRESSING;  Surgeon: Wallace Going, DO;  Location: [place];  Service:  Plastics;  Laterality: N/A;   AV FISTULA PLACEMENT Left 09/18/2020   Procedure: ARTERIOVENOUS (AV) FISTULA CREATION LEFT VERSUS GRAFT;  Surgeon: Serafina Mitchell, MD;  Location: [place];  Service: Vascular;  Laterality: Left;   [place] Left 10/31/2020   Procedure: LEFT SECOND STAGE [place];  Surgeon: Serafina Mitchell, MD;  [facility];  Service: Vascular;  Laterality: Left;   CARDIAC CATHETERIZATION     CORONARY ARTERY BYPASS GRAFT N/A 08/15/2019   Procedure: CORONARY ARTERY BYPASS GRAFTING (CABG), x 4 using bilateral IMAs and left radial artery .  LIMA TO LAD, RIMA TO PDA, RADIAL ARTERY TO CIRC AND SEQUENTIALLY TO OM1.;  Surgeon: [name], MD;  Location: [place];  Service: Open Heart Surgery;  Laterality: N/A;   CORONARY STENT PLACEMENT  02/27/2014   distal rt/pd coronary       dr Einar Gip   CYSTO/ BLADDER BIOPSY'S/ CAUTHERIZATION  01-14-2004  DR Gaynelle Arabian   EXPLORATION POST OPERATIVE OPEN HEART N/A 08/16/2019   Procedure: Chest Closure S?P CABG WITH APPLICATION OF PREVENA  INCISIONAL WOUND VAC;  Surgeon: [name], MD;  Location: [place];  Service: Open Heart Surgery;  Laterality: N/A;   EXPLORATION POST OPERATIVE OPEN HEART N/A 08/21/2019   Procedure: CHEST WASHOUT S/P OPEN CHEST;  Surgeon: [name], MD;  Location: [place];  Service: Open Heart Surgery;  Laterality: N/A;  Open chest with Esmark dressing with Ioban sealant coverage.   EXPLORATION POST OPERATIVE OPEN HEART N/A 08/18/2019   Procedure: EXPLORATION POST OPERATIVE OPEN HEART (performed 04/23 on unit);  Surgeon: [name], MD;  Location: [place];  Service: Open Heart Surgery;  Laterality: N/A;   EXPLORATION POST OPERATIVE OPEN HEART N/A 08/24/2019   Procedure: CHEST WASHOUT POST OPERATIVE OPEN HEART;  Surgeon: [name], MD;  Location: [place];  Service: Open Heart Surgery;  Laterality: N/A;   EXPLORATION POST OPERATIVE OPEN HEART N/A 08/29/2019   Procedure: CHEST WOUND WASHOUT POST OPERATIVE OPEN HEART;  Surgeon: [name], MD;  Location: [place];  Service: Open Heart Surgery;  Laterality: N/A;   EXPLORATION POST OPERATIVE OPEN HEART N/A 09/04/2019   Procedure: MEDIASTINAL EXPLORATION WITH STERNAL WOUND IRRIGATION;  Surgeon: Grace Pheng, MD;  Location: [facility];  Service: Open Heart Surgery;  Laterality: N/A;   EXPLORATION POST OPERATIVE OPEN HEART N/A 09/14/2019   Procedure: EVACUATION OF HEMATOMA;  Surgeon: Grace Raynell, MD;  Location: [facility];  Service: Open Heart Surgery;  Laterality: N/A;   IR FLUORO GUIDE CV LINE LEFT  09/19/2019   IR GASTROSTOMY TUBE MOD SED  10/04/2019   IR GASTROSTOMY TUBE REMOVAL  11/29/2019   IR PATIENT EVAL TECH 0-60 MINS  09/29/2019    IR US GUIDE VASC ACCESS LEFT  09/19/2019   LAPAROSCOPIC LYSIS OF ADHESIONS N/A 09/06/2019   Procedure: LAPAROSCOPIC OMENTAL HARVEST;  Surgeon: Mickeal Skinner, MD;  Location: [facility];  Service: General;  Laterality: N/A;   LEFT HEART CATH AND CORONARY ANGIOGRAPHY N/A 08/10/2019   Procedure: LEFT HEART CATH AND CORONARY ANGIOGRAPHY;  Surgeon: Nigel Mormon, MD;  Location: [place] [facility] CV LAB;  Service: Cardiovascular;  Laterality: N/A;   LEFT HEART CATHETERIZATION WITH CORONARY ANGIOGRAM N/A 02/27/2014   Procedure: LEFT HEART CATHETERIZATION WITH CORONARY ANGIOGRAM;  Surgeon: Laverda Page, MD;  [facility] C[facility];  Service: Cardiovascular;  Laterality: N/A;   MEDIASTINAL EXPLORATION N/A 09/06/2019   Procedure: MEDIASTINAL EXPLORATION;  Surgeon: Lanelle Bal  B, MD;  Location: [facility];  Service: Thoracic;  Laterality: N/A;   PECTORALIS FLAP  09/06/2019   Procedure: Pectoralis ADVANCEMENT Flap;  Surgeon: Wallace Going, DO;  Location: [place];  Service: Plastics;;   PERCUTANEOUS CORONARY STENT INTERVENTION (PCI-S)  02/27/2014   Procedure: PERCUTANEOUS CORONARY STENT INTERVENTION (PCI-S);  Surgeon: Laverda Page, MD;  Location: [facility] CATH LAB;  Service: Cardiovascular;;  rt PDA  3.0/28mm Promus stent   RADIAL ARTERY HARVEST Left 08/15/2019   Procedure: Radial Artery Harvest;  Surgeon: [name], MD;  Location: [place];  Service: Open Heart Surgery;  Laterality: Left;   RIB PLATING N/A 09/06/2019   Procedure: STERNAL PLATING;  Surgeon: Grace Brallan, MD;  Location: [place];  Service: Thoracic;  Laterality: N/A;   TEE WITHOUT CARDIOVERSION N/A 08/15/2019   Procedure: TRANSESOPHAGEAL ECHOCARDIOGRAM (TEE);  Surgeon: [name], MD;  Location: [place];  Service: Open Heart Surgery;  Laterality: N/A;   TRACHEOSTOMY TUBE PLACEMENT  08/29/2019   Procedure: Tracheostomy;  Surgeon: [name], MD;  [facility];  Service: Open Heart Surgery; Decannulated 6/10/20211    TRANSURETHRAL RESECTION OF PROSTATE  04/04/2012   Procedure: TRANSURETHRAL RESECTION OF THE PROSTATE WITH GYRUS INSTRUMENTS;  Surgeon: Ailene Rud, MD;  [facility];  Service: Urology;  Laterality: N/A;   TRANSURETHRAL RESECTION OF PROSTATE N/A 09/27/2014   Procedure: TRANSURETHRAL RESECTION OF THE PROSTATE ;  Surgeon: Carolan Clines, MD;  Location: [facility];  Service: Urology;  Laterality: N/A;   UPPER GASTROINTESTINAL ENDOSCOPY       reports that he quit smoking about 47 years ago. His smoking use included cigarettes. He has a 10.00 pack-year smoking history. He has never used smokeless tobacco. He reports that he does not currently use alcohol. He reports that he does not use drugs.  No Known Allergies  Family History  Problem Relation Age of Onset   Diabetes Mother    Hypertension Mother    Diabetes Father    Diabetes Brother    Hypertension Brother    Bone cancer Brother    Diabetes Brother      Prior to Admission medications   Medication Sig Start Date End Date Taking? Authorizing Provider  aspirin EC 81 MG tablet Take 81 mg by mouth daily.   Yes [provider]  atorvastatin (LIPITOR) 20 MG tablet Take 1 tablet (20 mg total) by mouth every evening. 07/18/20  Yes Adrian Prows, MD  clonazePAM (KLONOPIN) 0.5 MG disintegrating tablet TAKE 1 TABLET BY MOUTH TWICE A DAY Patient taking differently: Take 0.5 mg by mouth 2 (two) times daily. 10/22/20  Yes Hoyt Koch, MD  clopidogrel (PLAVIX) 75 MG tablet TAKE 1 TABLET BY MOUTH EVERY DAY Patient taking differently: Take 75 mg by mouth daily. 12/11/20  Yes Adrian Prows, MD  cromolyn (OPTICROM) 4 % ophthalmic solution Place 1 drop into both eyes every Wednesday. At bedtime 12/08/19  Yes [provider]  [employer] ACETAMINOPHEN 325 MG tablet TAKE 2 CAPS BY MOUTH EVERY 6 HOURS AS NEEDED FOR PAIN FOR TEMP 100F OR ABOVE Patient taking differently: Take 325 mg by mouth every 6 (six) hours as  needed for mild pain or headache (temp over 100). 02/23/20  Yes Hoyt Koch, MD  [employer] SENNA PLUS 8.6-50 MG tablet TAKE 2 TABLETS BY MOUTH AT BEDTIME FOR CONSTIPATION Patient taking differently: Take 1 tablet by mouth at bedtime. 11/04/20  Yes Hoyt Koch, MD  DM-APAP-CPM (CORICIDIN HBP  PO) Take 1 tablet by mouth every 6 (six) hours as needed (congestion/cough).   Yes [provider]  ferrous sulfate 325 (65 FE) MG tablet Take 325 mg by mouth daily with breakfast.   Yes [provider]  furosemide (LASIX) 40 MG tablet Take 80 mg by mouth 2 (two) times daily at 8am and 2pm. 11/30/20  Yes [provider]  hydrALAZINE (APRESOLINE) 25 MG tablet Take 2 tablets (50 mg total) by mouth 3 (three) times daily. 10/14/20 10/09/21 Yes Cantwell, Celeste C, PA-C  isosorbide dinitrate (ISORDIL) 30 MG tablet TAKE 1 TABLET BY MOUTH THREE TIMES A DAY Patient taking differently: Take 30 mg by mouth 2 (two) times daily. 11/25/20  Yes Cantwell, Celeste C, PA-C  metoprolol succinate (TOPROL-XL) 50 MG 24 hr tablet TAKE 3 TABLETS BY MOUTH EVERY DAY Patient taking differently: Take 50 mg by mouth daily. 11/22/20  Yes Adrian Prows, MD  multivitamin (RENA-VIT) TABS tablet Take 1 tablet by mouth at bedtime. 10/16/19  Yes Barrett, Erin R, PA-C  nitroGLYCERIN (NITROSTAT) 0.4 MG SL tablet Place 0.4 mg under the tongue every 5 (five) minutes as needed for chest pain. 10/25/19  Yes [provider]  oxyCODONE-acetaminophen (PERCOCET) 5-325 MG tablet Take 1 tablet by mouth every 6 (six) hours as needed for severe pain. 10/31/20 10/31/21 Yes Ulyses Amor, PA-C  potassium chloride (KLOR-CON) 10 MEQ tablet Take 10 mEq by mouth daily. 10/25/20  Yes [provider]  PREVIDENT 5000 BOOSTER PLUS 1.1 % PSTE Take 1 application by mouth every evening. 06/14/18  Yes [provider]  sertraline (ZOLOFT) 25 MG tablet TAKE 1 TABLET ONE TIME A DAY Patient taking differently: Take 25 mg by mouth  daily. 07/16/20  Yes Hoyt Koch, MD  silodosin (RAPAFLO) 8 MG CAPS capsule TAKE 1 CAPSULE BY MOUTH EVERYDAY AT BEDTIME Patient taking differently: Take 8 mg by mouth at bedtime. 09/24/20  Yes McKenzie, Candee Furbish, MD  Vitamin D, Ergocalciferol, (DRISDOL) 1.25 MG (50000 UNIT) CAPS capsule Take 1 capsule (50,000 Units total) by mouth every 7 (seven) days. Patient taking differently: Take 50,000 Units by mouth every Wednesday. 06/28/20  Yes Hoyt Koch, MD  furosemide (LASIX) 80 MG tablet Take 1.5 tablets (120 mg total) by mouth 2 (two) times daily with breakfast and lunch. Patient not taking: Reported on 12/12/2020 10/07/20 11/20/20  Nita Sells, MD    Physical Exam: Vitals:   12/12/20 0000 12/12/20 0030 12/12/20 0138 12/12/20 0336  BP: (!) 184/84 (!) 180/77 (!) 150/92 (!) 156/85  Pulse: 87 89 89 87  Resp: (!) 21 (!) 21 19 18   Temp:      TempSrc:      SpO2: 99% 99% 97% 98%    Constitutional: NAD, calm, comfortable Eyes: PERRL, lids and conjunctivae normal ENMT: Mucous membranes are moist. Posterior pharynx clear of any exudate or lesions.Normal dentition.  Neck: normal, supple, no masses, no thyromegaly Respiratory: Wheezing on R side mostly Cardiovascular: Regular rate and rhythm, 3-4+ LLE edema, 2+ RLE edema, 1+ RUE edema.  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 No hepatosplenomegaly. Bowel sounds positive.  Musculoskeletal: no clubbing / cyanosis. No joint deformity upper and lower extremities. Good ROM, no contractures. Normal muscle tone.  Skin: no rashes, lesions, ulcers. No induration Neurologic: CN 2-12 grossly intact. Sensation intact, DTR normal. Strength 5/5 in all 4.  Psychiatric: Normal judgment and insight. Alert and oriented x 3. Normal mood.    Labs on Admission: I have personally reviewed  following labs and imaging studies  CBC: Recent Labs  Lab 12/11/20 1533  WBC 9.2  NEUTROABS 6.5  HGB 10.8*  HCT 35.9*  MCV 109.8*   PLT 765   Basic Metabolic Panel: Recent Labs  Lab 12/11/20 1533  NA 142  K 4.2  CL 110  CO2 26  GLUCOSE 80  BUN 33*  CREATININE 2.43*  CALCIUM 9.1   GFR: CrCl cannot be calculated (Unknown ideal weight.). Liver Function Tests: No results for input(s): AST, ALT, ALKPHOS, BILITOT, PROT, ALBUMIN in the last 168 hours. No results for input(s): LIPASE, AMYLASE in the last 168 hours. No results for input(s): AMMONIA in the last 168 hours. Coagulation Profile: No results for input(s): INR, PROTIME in the last 168 hours. Cardiac Enzymes: No results for input(s): CKTOTAL, CKMB, CKMBINDEX, TROPONINI in the last 168 hours. BNP (last 3 results) Recent Labs    05/28/20 1538  PROBNP 64.0   HbA1C: No results for input(s): HGBA1C in the last 72 hours. CBG: No results for input(s): GLUCAP in the last 168 hours. Lipid Profile: No results for input(s): CHOL, HDL, LDLCALC, TRIG, CHOLHDL, LDLDIRECT in the last 72 hours. Thyroid Function Tests: No results for input(s): TSH, T4TOTAL, FREET4, T3FREE, THYROIDAB in the last 72 hours. Anemia Panel: No results for input(s): VITAMINB12, FOLATE, FERRITIN, TIBC, IRON, RETICCTPCT in the last 72 hours. Urine analysis:    Component Value Date/Time   COLORURINE YELLOW 08/14/2019 2140   APPEARANCEUR HAZY (A) 08/14/2019 2140   LABSPEC 1.006 08/14/2019 2140   PHURINE 6.0 08/14/2019 2140   GLUCOSEU 150 (A) 08/14/2019 2140   HGBUR MODERATE (A) 08/14/2019 2140   BILIRUBINUR NEGATIVE 08/14/2019 2140   KETONESUR NEGATIVE 08/14/2019 2140   PROTEINUR NEGATIVE 08/14/2019 2140   NITRITE NEGATIVE 08/14/2019 2140   LEUKOCYTESUR LARGE (A) 08/14/2019 2140    Radiological Exams on Admission: DG Chest 2 View  Result Date: 12/11/2020 CLINICAL DATA:  Shortness of breath and chest pain in a 75 year old male. EXAM: CHEST - 2 VIEW COMPARISON:  October 06, 2020. FINDINGS: Post median sternotomy for CABG. Cardiomediastinal contours with stable cardiac enlargement as  before. Slightly diminished but persistent LEFT pleural effusion. Persistent LEFT basilar opacity in the retrocardiac region. No visible pneumothorax. No new area of consolidation. Trace RIGHT effusion. On limited assessment no acute skeletal process. IMPRESSION: Persistent but slightly diminished LEFT pleural effusion and LEFT basilar opacity. Electronically Signed   By: Zetta Bills M.D.   On: 12/11/2020 16:18   CT Chest Wo Contrast  Result Date: 12/12/2020 CLINICAL DATA:  Shortness of breath. EXAM: CT CHEST WITHOUT CONTRAST TECHNIQUE: Multidetector CT imaging of the chest was performed following the standard protocol without IV contrast. COMPARISON:  Chest CT dated 09/18/2019 and radiograph dated 12/11/2020. FINDINGS: Evaluation of this exam is limited in the absence of intravenous contrast. Cardiovascular: There is no cardiomegaly or pericardial effusion. Three-vessel coronary vascular calcification. There is moderate atherosclerotic calcification of the thoracic aorta. The central pulmonary arteries are grossly unremarkable. Mediastinum/Nodes: No hilar or mediastinal adenopathy. The esophagus is grossly unremarkable. No mediastinal fluid collection. Lungs/Pleura: Small, chronic appearing bilateral pleural effusions, left greater than right. There is partial consolidative changes of the left lung base. Pneumonia is not excluded. There is however a cluster of ground-glass nodular density in the right lower lobe most consistent with atypical pneumonia. No pneumothorax. The central airways remain patent. Upper Abdomen: Bilateral adrenal thickening/hyperplasia versus adenoma. Musculoskeletal: Median sternotomy. Degenerative changes of the shoulders bilaterally. No acute osseous pathology. IMPRESSION: 1. Cluster  of ground-glass nodular density in the right lower lobe most consistent with atypical pneumonia. Small, chronic appearing bilateral pleural effusions, left greater than right. 2. Partial consolidative  changes of the left lung base. 3. Aortic Atherosclerosis (ICD10-I70.0). Electronically Signed   By: Anner Crete M.D.   On: 12/12/2020 01:03    EKG: Independently reviewed.  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  LIMA TO LAD, RIMA TO PDA, RADIAL ARTERY TO CIRC AND SEQUENTIALLY TO OM1.   CKD (chronic kidney disease) stage 4, GFR 15-29 ml/min (HCC)   Atypical pneumonia   Pleural effusion   Fluid overload    Shortness of breath - DDx includes atypical PNA, fluid overload with L pleural effusion and LLL collapse, CAD, COVID vaccine myo/pericarditis, PE Atypical PNA - PNA pathway Empiric rocephin + azithro Covid and flu neg Check RVP Check procalcitonin Fluid overload / acute CHF, L pleural effusion - L pleural effusion was also present in June, seems smaller today. BNP not as elevated as June Does have significant edema on exam though CHF pathway 2d echo Tele monitor 80mg  IV lasix in ED then 80mg  IV BID ? PE - Check DVT US of LLE Check d.dimer May need VQ scan depending on D.Dimer result H/o CAD, CABG - 2d echo as above Does have CP, not clear that pt having acute MI though with mostly unremarkable trops today Tele monitor Control BP If CP continues may need cards consult ? Also if CP may be related to mild myo/pericarditis from recent COVID vaccine booster (known side effect). CKD 4 - At baseline currently Monitor daily BMP with diuresis HTN - Cont home BP meds Add PRN labetalol  DVT prophylaxis: Heparin [place] Code Status: Full Family Communication: Wife at bedside Disposition Plan: Home after breathing improved, CP worked up [employer] called: None Admission status: Place in [place],  [place] [facility]  How to contact the [facility] Attending or Consulting provider [place] or covering provider during after hours [place], for this patient?  Check the care team in [facility] and look for a) attending/consulting [facility] provider listed and b) the [facility] team listed Log into [email or web address]  Amion Physician Scheduling and messaging for groups and whole hospitals  On call and physician scheduling software for group practices, residents, hospitalists and other medical providers for call, clinic, rotation and shift schedules. On[employer] is a hospital-wide system for scheduling doctors and paging doctors on call. EasyPlot is for scientific plotting and data analysis.  [email or web address]  and use [place]'s universal password to access. If you do not have the password, please contact the hospital operator.  Locate the [facility] provider you are looking for under [facility] and page to a number that you can be directly reached. If you still have difficulty reaching the provider, please page the [facility] (Director on Call) for the Hospitalists listed on amion for assistance.  12/12/2020, 4:16 AM

## 2020-12-12 NOTE — Progress Notes (Signed)
  Echocardiogram 2D Echocardiogram has been performed.  Christian [name] 12/12/2020, 10:12 AM

## 2020-12-12 NOTE — ED Notes (Signed)
Received verbal report from [place] at this time

## 2020-12-12 NOTE — ED Notes (Signed)
Pt O2 maintained around 96-98% while ambulating

## 2020-12-12 NOTE — ED Notes (Signed)
Order placed for IV team due to difficulty with IV start.

## 2020-12-12 NOTE — ED Notes (Signed)
Pt moved to room 2 at this time

## 2020-12-12 NOTE — Progress Notes (Signed)
VASCULAR LAB    Left lower extremity venous duplex has been performed.  See CV proc for preliminary results.   Daaiel Starlin, RVT 12/12/2020, 3:46 PM

## 2020-12-12 NOTE — ED Notes (Signed)
Verbal report given to Silverio Decamp RN at this time

## 2020-12-13 LAB — BASIC METABOLIC PANEL
Anion gap: 10 (ref 5–15)
BUN: 40 mg/dL — ABNORMAL HIGH (ref 8–23)
CO2: 24 mmol/L (ref 22–32)
Calcium: 8.8 mg/dL — ABNORMAL LOW (ref 8.9–10.3)
Chloride: 106 mmol/L (ref 98–111)
Creatinine, Ser: 2.76 mg/dL — ABNORMAL HIGH (ref 0.61–1.24)
GFR, Estimated: 24 mL/min — ABNORMAL LOW (ref >60)
Glucose, Bld: 95 mg/dL (ref 70–99)
Potassium: 3.9 mmol/L (ref 3.5–5.1)
Sodium: 140 mmol/L (ref 135–145)

## 2020-12-13 LAB — ECHOCARDIOGRAM COMPLETE
S' Lateral: 3.5 cm
Single Plane A4C EF: 49.1 %

## 2020-12-13 NOTE — Progress Notes (Signed)
PROGRESS NOTE    Christian [name]  XBJ:[id number] DOB: [birth year] DOA: 12/11/2020 PCP: Hoyt Koch, MD    Brief Narrative:  This 75 years old male with PMH significant for HTN, CKD stage IV, prior PE in the setting of CABG, CABG in May 2021, recent hospitalization for CHF exacerbation and was discharged on Lasix 80 mg twice daily.  He presented with complaints of progressive shortness of breath at rest and on exertion associated with intermittent chest pain which gets worse with deep breathing and coughing.  Patient reports all the symptoms started after he got COVID-vaccine ( fourth dose) last week.  Labs include BNP 184, Trop 22> 21, chest x-ray shows left lower lobe effusion.  CT chest chronic appearing bilateral pleural effusion,  cluster of groundglass nodular density in the right lower lobe more consistent with atypical pneumonia.  COVID and flu swab negative. Patient is admitted for community-acquired pneumonia,  started on ceftriaxone and Zithromax.  Patient is also continued on Lasix 80 mg twice daily for CHF.  Renal functions at baseline,  blood pressure is better controlled.  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  LIMA TO LAD, RIMA TO PDA, RADIAL ARTERY TO CIRC AND SEQUENTIALLY TO OM1.   CKD (chronic kidney disease) stage 4, GFR 15-29 ml/min (HCC)   Atypical pneumonia   Pleural effusion   Fluid overload   Community-acquired pneumonia: Patient presented with shortness of breath at rest and on exertion. He is not hypoxic, remains on room air , sats 96%. Differential diagnoses include atypical pneumonia, fluid overload, COVID-vaccine, PE. CT chest showed chronic appearing bilateral pleural effusion, groundglass nodular density in the right lower  lobe consistent with atypical pneumonia. Continue empiric ceftriaxone and Zithromax. COVID and influenza negative.  Procalcitonin 3.08  Chronic diastolic CHF /bilateral pleural effusion: Fluid overload and CHF could be contributing to her shortness of breath. BNP 184.  Bilateral pedal edema noted. 2D echocardiogram April/22 LVEF 67%.  No regional wall motion abnormality. Continue telemetry, continue Lasix 80 mg IV twice daily Continue to monitor daily weight , intake output charting.  History of CAD, s/p CABG Patient denies any chest pain, troponin slightly elevated could be in the setting of CKD. Continue aspirin, Plavix, Lipitor, Imdur, metoprolol  CKD stage IV: Serum creatinine baseline.  Baseline creatinine 2.3-2.7 Continue to monitor, avoid nephrotoxic medications.  Hypertension : continue home blood pressure medication, as needed labetalol.  Elevated D-dimer: Venous duplex negative for DVT, VQ scan not needed patient is not manifesting symptoms of PE.   DVT prophylaxis: Heparin sq Code Status: Full code. Family Communication: No family at bed side. Disposition Plan:    Status is: Inpatient  Remains inpatient appropriate because:Inpatient level of care appropriate due to severity of illness  Dispo: The patient is from: Home              Anticipated d/c is to: Home              Patient currently is not medically stable to d/c.   Difficult to place patient No  Consultants:  NONE  Procedures: None Antimicrobials:   Anti-infectives (From admission, onward)    Start     Dose/Rate Route Frequency Ordered Stop   12/13/20 0500  azithromycin (ZITHROMAX) 500  mg in sodium chloride 0.9 % 250 mL IVPB        500 mg 250 mL/hr over 60 Minutes Intravenous Every 24 hours 12/12/20 0218     12/13/20 0500  cefTRIAXone (ROCEPHIN) 2 g in sodium chloride 0.9 % 100 mL IVPB        2 g 200 mL/hr over 30 Minutes Intravenous Every 24 hours 12/12/20 0218     12/12/20 0115  cefTRIAXone  (ROCEPHIN) 1 g in sodium chloride 0.9 % 100 mL IVPB        1 g 200 mL/hr over 30 Minutes Intravenous  Once 12/12/20 0107 12/12/20 0556   12/12/20 0115  azithromycin (ZITHROMAX) 500 mg in sodium chloride 0.9 % 250 mL IVPB        500 mg 250 mL/hr over 60 Minutes Intravenous  Once 12/12/20 0107 12/12/20 0657        Subjective: Patient is seen and examined at bedside.  Overnight events noted.   Patient reports feeling much improved. He denies any chest pain or shortness of breath.  Objective: Vitals:   12/12/20 1844 12/12/20 2012 12/13/20 0037 12/13/20 0547  BP: (!) 112/54 (!) 132/57 102/63 125/77  Pulse: 88 95 91 88  Resp: 18 18 16 15   Temp: 98.3 F (36.8 C) 97.8 F (36.6 C) 98.2 F (36.8 C) 98.3 F (36.8 C)  TempSrc: Oral Oral  Oral  SpO2: 100% 98% 98% 96%  Weight:      Height:        Intake/Output Summary (Last 24 hours) at 12/13/2020 1537 Last data filed at 12/13/2020 3419 Gross per 24 hour  Intake 240 ml  Output 600 ml  Net -360 ml   Filed Weights   12/12/20 0900  Weight: 99.4 kg    Examination:  General exam: Appears comfortable, not in any acute distress. Respiratory system: Clear to auscultation bilaterally, no wheezing. Cardiovascular system: S1-S2 heard, no murmur, regular rate and rhythm.   Gastrointestinal system: Abdomen is soft, nontender, nondistended, bowel sounds + Central nervous system: Alert and oriented x3, no focal neurological deficits. Extremities: 2+ pitting edema noted, no cyanosis, no clubbing. Skin: No rashes, lesions or ulcers Psychiatry: Mood and affect appropriate.    Data Reviewed: I have personally reviewed following labs and imaging studies  CBC: Recent Labs  Lab 12/11/20 1533 12/12/20 0336  WBC 9.2 8.4  NEUTROABS 6.5  --   HGB 10.8* 11.7*  HCT 35.9* 38.1*  MCV 109.8* 107.3*  PLT 203 622   Basic Metabolic Panel: Recent Labs  Lab 12/11/20 1533 12/12/20 0336 12/13/20 0133  NA 142 143 140  K 4.2 3.9 3.9  CL 110  106 106  CO2 26 23 24   GLUCOSE 80 96 95  BUN 33* 32* 40*  CREATININE 2.43* 2.46* 2.76*  CALCIUM 9.1 9.8 8.8*   GFR: Estimated Creatinine Clearance: 26.8 mL/min (A) (by C-G formula based on SCr of 2.76 mg/dL (H)). Liver Function Tests: No results for input(s): AST, ALT, ALKPHOS, BILITOT, PROT, ALBUMIN in the last 168 hours. No results for input(s): LIPASE, AMYLASE in the last 168 hours. No results for input(s): AMMONIA in the last 168 hours. Coagulation Profile: No results for input(s): INR, PROTIME in the last 168 hours. Cardiac Enzymes: No results for input(s): CKTOTAL, CKMB, CKMBINDEX, TROPONINI in the last 168 hours. BNP (last 3 results) Recent Labs    05/28/20 1538  PROBNP 64.0   HbA1C: No results for input(s): HGBA1C in the last 72 hours. CBG: No results for  input(s): GLUCAP in the last 168 hours. Lipid Profile: No results for input(s): CHOL, HDL, LDLCALC, TRIG, CHOLHDL, LDLDIRECT in the last 72 hours. Thyroid Function Tests: No results for input(s): TSH, T4TOTAL, FREET4, T3FREE, THYROIDAB in the last 72 hours. Anemia Panel: No results for input(s): VITAMINB12, FOLATE, FERRITIN, TIBC, IRON, RETICCTPCT in the last 72 hours. Sepsis Labs: Recent Labs  Lab 12/12/20 0335 12/12/20 0336  PROCALCITON  --  <0.10  LATICACIDVEN 1.2  --     Recent Results (from the past 240 hour(s))  Resp Panel by RT-PCR (Flu A&B, Covid) Nasopharyngeal Swab     Status: None   Collection Time: 12/11/20 11:28 PM   Specimen: Nasopharyngeal Swab; Nasopharyngeal(NP) swabs in vial transport medium  Result Value Ref Range Status   SARS Coronavirus 2 by RT PCR NEGATIVE NEGATIVE Final    Comment: (NOTE) SARS-CoV-2 target nucleic acids are NOT DETECTED.  The SARS-CoV-2 RNA is generally detectable in upper respiratory specimens during the acute phase of infection. The lowest concentration of SARS-CoV-2 viral copies this assay can detect is 138 copies/mL. A negative result does not preclude  SARS-Cov-2 infection and should not be used as the sole basis for treatment or other patient management decisions. A negative result may occur with  improper specimen collection/handling, submission of specimen other than nasopharyngeal swab, presence of viral mutation(s) within the areas targeted by this assay, and inadequate number of viral copies(<138 copies/mL). A negative result must be combined with clinical observations, patient history, and epidemiological information. The expected result is Negative.  Fact Sheet for Patients:  EntrepreneurPulse.com.au  Fact Sheet for Healthcare Providers:  IncredibleEmployment.be  This test is no t yet approved or cleared by the [place] FDA and  has been authorized for detection and/or diagnosis of SARS-CoV-2 by FDA under an Emergency Use Authorization (EUA). This EUA will remain  in effect (meaning this test can be used) for the duration of the COVID-19 declaration under Section 564(b)(1) of the Act, 21 U.S.C.section 360bbb-3(b)(1), unless the authorization is terminated  or revoked sooner.       Influenza A by PCR NEGATIVE NEGATIVE Final   Influenza B by PCR NEGATIVE NEGATIVE Final    Comment: (NOTE) The Xpert Xpress SARS-CoV-2/FLU/RSV plus assay is intended as an aid in the diagnosis of influenza from Nasopharyngeal swab specimens and should not be used as a sole basis for treatment. Nasal washings and aspirates are unacceptable for Xpert Xpress SARS-CoV-2/FLU/RSV testing.  Fact Sheet for Patients: EntrepreneurPulse.com.au  Fact Sheet for Healthcare Providers: IncredibleEmployment.be  This test is not yet approved or cleared by the [place] FDA and has been authorized for detection and/or diagnosis of SARS-CoV-2 by FDA under an Emergency Use Authorization (EUA). This EUA will remain in effect (meaning this test can be used) for the duration of  the COVID-19 declaration under Section 564(b)(1) of the Act, 21 U.S.C. section 360bbb-3(b)(1), unless the authorization is terminated or revoked.  Performed at [facility], [place] [address] [place], [place] [address]   Blood culture (routine x 2)     Status: None (Preliminary result)   Collection Time: 12/12/20  3:35 AM   Specimen: BLOOD RIGHT HAND  Result Value Ref Range Status   Specimen Description BLOOD RIGHT HAND  Final   Special Requests   Final    BOTTLES DRAWN AEROBIC AND ANAEROBIC Blood Culture adequate volume   Culture   Final    NO GROWTH 1 DAY Performed at [facility], [place]  [address], [place], [place] [address]    Report Status PENDING  Incomplete  Blood culture (routine x 2)     Status: None (Preliminary result)   Collection Time: 12/12/20  3:36 AM   Specimen: BLOOD  Result Value Ref Range Status   Specimen Description BLOOD RIGHT ANTECUBITAL  Final   Special Requests   Final    AEROBIC BOTTLE ONLY Blood Culture results may not be optimal due to an excessive volume of blood received in culture bottles   Culture   Final    NO GROWTH 1 DAY Performed at [facility], [place] [address], [place], [place] [address]    Report Status PENDING  Incomplete    Radiology Studies: DG Chest 2 View  Result Date: 12/11/2020 CLINICAL DATA:  Shortness of breath and chest pain in a 75 year old male. EXAM: CHEST - 2 VIEW COMPARISON:  October 06, 2020. FINDINGS: Post median sternotomy for CABG. Cardiomediastinal contours with stable cardiac enlargement as before. Slightly diminished but persistent LEFT pleural effusion. Persistent LEFT basilar opacity in the retrocardiac region. No visible pneumothorax. No new area of consolidation. Trace RIGHT effusion. On limited assessment no acute skeletal process. IMPRESSION: Persistent but slightly diminished LEFT pleural effusion and LEFT basilar opacity. Electronically Signed   By: Zetta Bills M.D.   On: 12/11/2020 16:18   CT  Chest Wo Contrast  Result Date: 12/12/2020 CLINICAL DATA:  Shortness of breath. EXAM: CT CHEST WITHOUT CONTRAST TECHNIQUE: Multidetector CT imaging of the chest was performed following the standard protocol without IV contrast. COMPARISON:  Chest CT dated 09/18/2019 and radiograph dated 12/11/2020. FINDINGS: Evaluation of this exam is limited in the absence of intravenous contrast. Cardiovascular: There is no cardiomegaly or pericardial effusion. Three-vessel coronary vascular calcification. There is moderate atherosclerotic calcification of the thoracic aorta. The central pulmonary arteries are grossly unremarkable. Mediastinum/Nodes: No hilar or mediastinal adenopathy. The esophagus is grossly unremarkable. No mediastinal fluid collection. Lungs/Pleura: Small, chronic appearing bilateral pleural effusions, left greater than right. There is partial consolidative changes of the left lung base. Pneumonia is not excluded. There is however a cluster of ground-glass nodular density in the right lower lobe most consistent with atypical pneumonia. No pneumothorax. The central airways remain patent. Upper Abdomen: Bilateral adrenal thickening/hyperplasia versus adenoma. Musculoskeletal: Median sternotomy. Degenerative changes of the shoulders bilaterally. No acute osseous pathology. IMPRESSION: 1. Cluster of ground-glass nodular density in the right lower lobe most consistent with atypical pneumonia. Small, chronic appearing bilateral pleural effusions, left greater than right. 2. Partial consolidative changes of the left lung base. 3. Aortic Atherosclerosis (ICD10-I70.0). Electronically Signed   By: Anner Crete M.D.   On: 12/12/2020 01:03   VAS [place] LOWER EXTREMITY VENOUS (DVT)  Result Date: 12/12/2020  Lower Venous DVT Study Patient Name:  HUSAYN REIM  Date of Exam:   12/12/2020 Medical Rec #: [id number]        Accession #:    [id number] Date of Birth: 27-Oct-1945       Patient Gender: M Patient Age:   61 years  Exam Location:  [facility] Procedure:      VAS [place] LOWER EXTREMITY VENOUS (DVT) Referring Phys: Jennette Kettle --------------------------------------------------------------------------------  Indications: Edema, and SOB.  Comparison Study: Prior negative Left LEV done 10/03/2020 Performing Technologist: Sharion Dove RVS  Examination Guidelines: A complete evaluation includes B-mode imaging, spectral Doppler, color Doppler, and power Doppler as needed of all accessible portions of each vessel. Bilateral testing is considered an integral part of a complete  examination. Limited examinations for reoccurring indications may be performed as noted. The reflux portion of the exam is performed with the patient in reverse Trendelenburg.  +-----+---------------+---------+-----------+----------+--------------+ RIGHTCompressibilityPhasicitySpontaneityPropertiesThrombus Aging +-----+---------------+---------+-----------+----------+--------------+ CFV  Full           Yes      Yes                                 +-----+---------------+---------+-----------+----------+--------------+   +---------+---------------+---------+-----------+----------+--------------+ LEFT     CompressibilityPhasicitySpontaneityPropertiesThrombus Aging +---------+---------------+---------+-----------+----------+--------------+ CFV      Full           Yes      Yes                                 +---------+---------------+---------+-----------+----------+--------------+ SFJ      Full                                                        +---------+---------------+---------+-----------+----------+--------------+ FV Prox  Full                                                        +---------+---------------+---------+-----------+----------+--------------+ FV Mid   Full                                                        +---------+---------------+---------+-----------+----------+--------------+ FV  DistalFull                                                        +---------+---------------+---------+-----------+----------+--------------+ PFV      Full                                                        +---------+---------------+---------+-----------+----------+--------------+ POP      Full           Yes      Yes                                 +---------+---------------+---------+-----------+----------+--------------+ PTV      Full                                                        +---------+---------------+---------+-----------+----------+--------------+ PERO     Full                                                        +---------+---------------+---------+-----------+----------+--------------+  Summary: RIGHT: - No evidence of common femoral vein obstruction.  LEFT: - There is no evidence of deep vein thrombosis in the lower extremity.  - No cystic structure found in the popliteal fossa.  *See table(s) above for measurements and observations. Electronically signed by Servando Snare MD on 12/12/2020 at 4:46:14 PM.    Final     Scheduled Meds:  aspirin EC  81 mg Oral Daily   atorvastatin  20 mg Oral QPM   clonazePAM  0.5 mg Oral BID   clopidogrel  75 mg Oral Daily   cromolyn  1 drop Both Eyes Q Wed   ferrous sulfate  325 mg Oral Q breakfast   furosemide  80 mg Intravenous BID   heparin  5,000 Units Subcutaneous Q8H   hydrALAZINE  50 mg Oral TID   isosorbide dinitrate  30 mg Oral BID   metoprolol succinate  50 mg Oral Daily   multivitamin  1 tablet Oral QHS   potassium chloride  10 mEq Oral Daily   senna-docusate  1 tablet Oral QHS   sertraline  25 mg Oral Daily   tamsulosin  0.4 mg Oral QHS   Continuous Infusions:  azithromycin 500 mg (12/13/20 0551)   cefTRIAXone (ROCEPHIN)  IV 2 g (12/13/20 0552)     LOS: 1 day    Time spent: 35 mins    Kien Mirsky, MD [facility]   If 7PM-7AM, please contact night-coverage

## 2020-12-14 LAB — BASIC METABOLIC PANEL
Anion gap: 9 (ref 5–15)
BUN: 43 mg/dL — ABNORMAL HIGH (ref 8–23)
CO2: 23 mmol/L (ref 22–32)
Calcium: 8.4 mg/dL — ABNORMAL LOW (ref 8.9–10.3)
Chloride: 103 mmol/L (ref 98–111)
Creatinine, Ser: 2.69 mg/dL — ABNORMAL HIGH (ref 0.61–1.24)
GFR, Estimated: 24 mL/min — ABNORMAL LOW (ref >60)
Glucose, Bld: 98 mg/dL (ref 70–99)
Potassium: 3.9 mmol/L (ref 3.5–5.1)
Sodium: 135 mmol/L (ref 135–145)

## 2020-12-14 MED ORDER — SALINE SPRAY 0.65 % NA SOLN
1.0000 | NASAL | Status: DC | PRN
  Filled 2020-12-14: qty 44

## 2020-12-14 NOTE — Progress Notes (Signed)
PROGRESS NOTE    Christian [name]  MOQ:[id number] DOB: 06-08-1945 DOA: 12/11/2020 PCP: Hoyt Koch, MD    Brief Narrative:  This 75 years old male with PMH significant for HTN, CKD stage IV, prior PE in the setting of CABG, CABG in May 2021, recent hospitalization for CHF exacerbation and was discharged on Lasix 80 mg twice daily.  He presented with complaints of progressive shortness of breath at rest and on exertion associated with intermittent chest pain which gets worse with deep breathing and coughing.  Patient reports all the symptoms started after he got COVID-vaccine ( fourth dose) last week.  Labs include BNP 184, Trop 22> 21, chest x-ray shows left lower lobe effusion.  CT chest chronic appearing bilateral pleural effusion,  cluster of groundglass nodular density in the right lower lobe more consistent with atypical pneumonia.  COVID and flu swab negative. Patient is admitted for community-acquired pneumonia,  started on ceftriaxone and Zithromax.  Patient is also continued on Lasix 80 mg twice daily for CHF.  Renal functions at baseline,  blood pressure is better controlled.  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  LIMA TO LAD, RIMA TO PDA, RADIAL ARTERY TO CIRC AND SEQUENTIALLY TO OM1.   CKD (chronic kidney disease) stage 4, GFR 15-29 ml/min (HCC)   Atypical pneumonia   Pleural effusion   Fluid overload  Community-acquired pneumonia: Patient presented with shortness of breath at rest and on exertion. He is not hypoxic, remains on room air , sats 96%. Differential diagnoses include atypical pneumonia, fluid overload, COVID-vaccine, PE. CT chest showed chronic appearing bilateral pleural effusion, groundglass nodular density in the right lower  lobe consistent with atypical pneumonia. Continue empiric ceftriaxone and Zithromax. COVID and influenza negative.  Procalcitonin 3.54  Chronic diastolic CHF /bilateral pleural effusion: Fluid overload and CHF could be contributing to her shortness of breath. BNP 184.  Bilateral pedal edema noted. 2D echocardiogram April/22 LVEF 67%.  No regional wall motion abnormality. Repeat Echo showed LVEF reduced to 45- 50% Continue telemetry, continue Lasix 80 mg IV twice daily Continue to monitor daily weight , intake output charting. Consult cardiology to guide plan for low EF.  History of CAD, s/p CABG Patient denies any chest pain, troponin slightly elevated could be in the setting of CKD. Continue aspirin, Plavix, Lipitor, Imdur, metoprolol  CKD stage IV: Serum creatinine baseline.  Baseline creatinine 2.3-2.7 Continue to monitor, avoid nephrotoxic medications.  Hypertension : continue home blood pressure medication, as needed labetalol.  Elevated D-dimer: Venous duplex negative for DVT, VQ scan not needed patient is not manifesting symptoms of PE.   DVT prophylaxis: Heparin sq Code Status: Full code. Family Communication: No family at bed side. Disposition Plan:    Status is: Inpatient  Remains inpatient appropriate because:Inpatient level of care appropriate due to severity of illness  Dispo: The patient is from: Home              Anticipated d/c is to: Home              Patient currently is not medically stable to d/c.   Difficult to place patient No  Consultants:  NONE  Procedures: None Antimicrobials:   Anti-infectives (From admission, onward)    Start  Dose/Rate Route Frequency Ordered Stop   12/13/20 0500  azithromycin (ZITHROMAX) 500 mg in sodium chloride 0.9 % 250 mL IVPB        500 mg 250 mL/hr over 60 Minutes Intravenous Every 24 hours 12/12/20 0218     12/13/20 0500  cefTRIAXone (ROCEPHIN) 2 g in sodium chloride 0.9 % 100 mL IVPB        2 g 200 mL/hr  over 30 Minutes Intravenous Every 24 hours 12/12/20 0218     12/12/20 0115  cefTRIAXone (ROCEPHIN) 1 g in sodium chloride 0.9 % 100 mL IVPB        1 g 200 mL/hr over 30 Minutes Intravenous  Once 12/12/20 0107 12/12/20 0556   12/12/20 0115  azithromycin (ZITHROMAX) 500 mg in sodium chloride 0.9 % 250 mL IVPB        500 mg 250 mL/hr over 60 Minutes Intravenous  Once 12/12/20 0107 12/12/20 0657        Subjective: Patient is seen and examined at bedside.  No overnight events. Patient reports feeling much improved.  He denies any chest pain or shortness of breath.  Objective: Vitals:   12/13/20 1952 12/13/20 2244 12/14/20 0453 12/14/20 0838  BP: (!) 132/59 (!) 144/74 (!) 144/74 123/67  Pulse: 92  96 (!) 102  Resp: 18 18 18 17   Temp: 98.2 F (36.8 C)  97.9 F (36.6 C) 97.6 F (36.4 C)  TempSrc: Oral Oral Oral Axillary  SpO2: 99% 100% 100% 98%  Weight:      Height:        Intake/Output Summary (Last 24 hours) at 12/14/2020 1435 Last data filed at 12/14/2020 1300 Gross per 24 hour  Intake 540 ml  Output 1850 ml  Net -1310 ml    Filed Weights   12/12/20 0900  Weight: 99.4 kg    Examination:  General exam: Appears comfortable, not in any acute distress. Respiratory system: Clear to auscultation bilaterally, no wheezing. Cardiovascular system: S1-S2 heard, no murmur, regular rate and rhythm.   Gastrointestinal system: Abdomen is soft, nontender, nondistended, bowel sounds + Central nervous system: Alert and oriented x3, no focal neurological deficits. Extremities: 2+ pitting edema noted, no cyanosis, no clubbing. Skin: No rashes, lesions or ulcers Psychiatry: Mood and affect appropriate.    Data Reviewed: I have personally reviewed following labs and imaging studies  CBC: Recent Labs  Lab 12/11/20 1533 12/12/20 0336  WBC 9.2 8.4  NEUTROABS 6.5  --   HGB 10.8* 11.7*  HCT 35.9* 38.1*  MCV 109.8* 107.3*  PLT 203 867    Basic Metabolic Panel: Recent Labs  Lab  12/11/20 1533 12/12/20 0336 12/13/20 0133 12/14/20 0124  NA 142 143 140 135  K 4.2 3.9 3.9 3.9  CL 110 106 106 103  CO2 26 23 24 23   GLUCOSE 80 96 95 98  BUN 33* 32* 40* 43*  CREATININE 2.43* 2.46* 2.76* 2.69*  CALCIUM 9.1 9.8 8.8* 8.4*    GFR: Estimated Creatinine Clearance: 27.5 mL/min (A) (by C-G formula based on SCr of 2.69 mg/dL (H)). Liver Function Tests: No results for input(s): AST, ALT, ALKPHOS, BILITOT, PROT, ALBUMIN in the last 168 hours. No results for input(s): LIPASE, AMYLASE in the last 168 hours. No results for input(s): AMMONIA in the last 168 hours. Coagulation Profile: No results for input(s): INR, PROTIME in the last 168 hours. Cardiac Enzymes: No results for input(s): CKTOTAL, CKMB, CKMBINDEX, TROPONINI in the last 168 hours. BNP (last 3 results) Recent Labs  05/28/20 1538  PROBNP 64.0    HbA1C: No results for input(s): HGBA1C in the last 72 hours. CBG: No results for input(s): GLUCAP in the last 168 hours. Lipid Profile: No results for input(s): CHOL, HDL, LDLCALC, TRIG, CHOLHDL, LDLDIRECT in the last 72 hours. Thyroid Function Tests: No results for input(s): TSH, T4TOTAL, FREET4, T3FREE, THYROIDAB in the last 72 hours. Anemia Panel: No results for input(s): VITAMINB12, FOLATE, FERRITIN, TIBC, IRON, RETICCTPCT in the last 72 hours. Sepsis Labs: Recent Labs  Lab 12/12/20 0335 12/12/20 0336  PROCALCITON  --  <0.10  LATICACIDVEN 1.2  --      Recent Results (from the past 240 hour(s))  Resp Panel by RT-PCR (Flu A&B, Covid) Nasopharyngeal Swab     Status: None   Collection Time: 12/11/20 11:28 PM   Specimen: Nasopharyngeal Swab; Nasopharyngeal(NP) swabs in vial transport medium  Result Value Ref Range Status   SARS Coronavirus 2 by RT PCR NEGATIVE NEGATIVE Final    Comment: (NOTE) SARS-CoV-2 target nucleic acids are NOT DETECTED.  The SARS-CoV-2 RNA is generally detectable in upper respiratory specimens during the acute phase of  infection. The lowest concentration of SARS-CoV-2 viral copies this assay can detect is 138 copies/mL. A negative result does not preclude SARS-Cov-2 infection and should not be used as the sole basis for treatment or other patient management decisions. A negative result may occur with  improper specimen collection/handling, submission of specimen other than nasopharyngeal swab, presence of viral mutation(s) within the areas targeted by this assay, and inadequate number of viral copies(<138 copies/mL). A negative result must be combined with clinical observations, patient history, and epidemiological information. The expected result is Negative.  Fact Sheet for Patients:  EntrepreneurPulse.com.au  Fact Sheet for Healthcare Providers:  IncredibleEmployment.be  This test is no t yet approved or cleared by the [place] FDA and  has been authorized for detection and/or diagnosis of SARS-CoV-2 by FDA under an Emergency Use Authorization (EUA). This EUA will remain  in effect (meaning this test can be used) for the duration of the COVID-19 declaration under Section 564(b)(1) of the Act, 21 U.S.C.section 360bbb-3(b)(1), unless the authorization is terminated  or revoked sooner.       Influenza A by PCR NEGATIVE NEGATIVE Final   Influenza B by PCR NEGATIVE NEGATIVE Final    Comment: (NOTE) The Xpert Xpress SARS-CoV-2/FLU/RSV plus assay is intended as an aid in the diagnosis of influenza from Nasopharyngeal swab specimens and should not be used as a sole basis for treatment. Nasal washings and aspirates are unacceptable for Xpert Xpress SARS-CoV-2/FLU/RSV testing.  Fact Sheet for Patients: EntrepreneurPulse.com.au  Fact Sheet for Healthcare Providers: IncredibleEmployment.be  This test is not yet approved or cleared by the [place] FDA and has been authorized for detection and/or diagnosis of SARS-CoV-2  by FDA under an Emergency Use Authorization (EUA). This EUA will remain in effect (meaning this test can be used) for the duration of the COVID-19 declaration under Section 564(b)(1) of the Act, 21 U.S.C. section 360bbb-3(b)(1), unless the authorization is terminated or revoked.  Performed at [facility], [place] [address] [place], [place] [address]   Blood culture (routine x 2)     Status: None (Preliminary result)   Collection Time: 12/12/20  3:35 AM   Specimen: BLOOD RIGHT HAND  Result Value Ref Range Status   Specimen Description BLOOD RIGHT HAND  Final   Special Requests   Final    BOTTLES DRAWN AEROBIC AND ANAEROBIC Blood Culture  adequate volume   Culture   Final    NO GROWTH 2 DAYS Performed at [facility], [place] [address] [place], [place] [address]    Report Status PENDING  Incomplete  Blood culture (routine x 2)     Status: None (Preliminary result)   Collection Time: 12/12/20  3:36 AM   Specimen: BLOOD  Result Value Ref Range Status   Specimen Description BLOOD RIGHT ANTECUBITAL  Final   Special Requests   Final    AEROBIC BOTTLE ONLY Blood Culture results may not be optimal due to an excessive volume of blood received in culture bottles   Culture   Final    NO GROWTH 2 DAYS Performed at [facility], [place] [address], [place], [place] [address]    Report Status PENDING  Incomplete     Radiology Studies: VAS [place] LOWER EXTREMITY VENOUS (DVT)  Result Date: 12/12/2020  Lower Venous DVT Study Patient Name:  HELDER CRISAFULLI  Date of Exam:   12/12/2020 Medical Rec #: [id number]        Accession #:    [id number] Date of Birth: [birth year]       Patient Gender: M Patient Age:   31 years Exam Location:  [facility] Procedure:      VAS [place] LOWER EXTREMITY VENOUS (DVT) Referring Phys: Jennette Kettle --------------------------------------------------------------------------------  Indications: Edema, and SOB.  Comparison Study: Prior negative Left LEV done  10/03/2020 Performing Technologist: Sharion Dove RVS  Examination Guidelines: A complete evaluation includes B-mode imaging, spectral Doppler, color Doppler, and power Doppler as needed of all accessible portions of each vessel. Bilateral testing is considered an integral part of a complete examination. Limited examinations for reoccurring indications may be performed as noted. The reflux portion of the exam is performed with the patient in reverse Trendelenburg.  +-----+---------------+---------+-----------+----------+--------------+ RIGHTCompressibilityPhasicitySpontaneityPropertiesThrombus Aging +-----+---------------+---------+-----------+----------+--------------+ CFV  Full           Yes      Yes                                 +-----+---------------+---------+-----------+----------+--------------+   +---------+---------------+---------+-----------+----------+--------------+ LEFT     CompressibilityPhasicitySpontaneityPropertiesThrombus Aging +---------+---------------+---------+-----------+----------+--------------+ CFV      Full           Yes      Yes                                 +---------+---------------+---------+-----------+----------+--------------+ SFJ      Full                                                        +---------+---------------+---------+-----------+----------+--------------+ FV Prox  Full                                                        +---------+---------------+---------+-----------+----------+--------------+ FV Mid   Full                                                        +---------+---------------+---------+-----------+----------+--------------+  FV DistalFull                                                        +---------+---------------+---------+-----------+----------+--------------+ PFV      Full                                                         +---------+---------------+---------+-----------+----------+--------------+ POP      Full           Yes      Yes                                 +---------+---------------+---------+-----------+----------+--------------+ PTV      Full                                                        +---------+---------------+---------+-----------+----------+--------------+ PERO     Full                                                        +---------+---------------+---------+-----------+----------+--------------+    Summary: RIGHT: - No evidence of common femoral vein obstruction.  LEFT: - There is no evidence of deep vein thrombosis in the lower extremity.  - No cystic structure found in the popliteal fossa.  *See table(s) above for measurements and observations. Electronically signed by Servando Snare MD on 12/12/2020 at 4:46:14 PM.    Final     Scheduled Meds:  aspirin EC  81 mg Oral Daily   atorvastatin  20 mg Oral QPM   clonazePAM  0.5 mg Oral BID   clopidogrel  75 mg Oral Daily   cromolyn  1 drop Both Eyes Q Wed   ferrous sulfate  325 mg Oral Q breakfast   furosemide  80 mg Intravenous BID   heparin  5,000 Units Subcutaneous Q8H   hydrALAZINE  50 mg Oral TID   isosorbide dinitrate  30 mg Oral BID   metoprolol succinate  50 mg Oral Daily   multivitamin  1 tablet Oral QHS   potassium chloride  10 mEq Oral Daily   senna-docusate  1 tablet Oral QHS   sertraline  25 mg Oral Daily   tamsulosin  0.4 mg Oral QHS   Continuous Infusions:  azithromycin 500 mg (12/14/20 0452)   cefTRIAXone (ROCEPHIN)  IV 2 g (12/14/20 0637)     LOS: 2 days    Time spent: 25 mins    Bowman Higbie, MD [facility]   If 7PM-7AM, please contact night-coverage

## 2020-12-15 LAB — BASIC METABOLIC PANEL
Anion gap: 7 (ref 5–15)
BUN: 40 mg/dL — ABNORMAL HIGH (ref 8–23)
CO2: 25 mmol/L (ref 22–32)
Calcium: 8.6 mg/dL — ABNORMAL LOW (ref 8.9–10.3)
Chloride: 106 mmol/L (ref 98–111)
Creatinine, Ser: 2.76 mg/dL — ABNORMAL HIGH (ref 0.61–1.24)
GFR, Estimated: 24 mL/min — ABNORMAL LOW (ref >60)
Glucose, Bld: 204 mg/dL — ABNORMAL HIGH (ref 70–99)
Potassium: 3.6 mmol/L (ref 3.5–5.1)
Sodium: 138 mmol/L (ref 135–145)

## 2020-12-15 MED ORDER — AZITHROMYCIN 250 MG PO TABS
250.0000 mg | ORAL_TABLET | Freq: Every day | ORAL | Status: AC
  Administered 2020-12-16: 250 mg via ORAL
  Filled 2020-12-15: qty 1

## 2020-12-15 MED ORDER — CEFDINIR 300 MG PO CAPS
300.0000 mg | ORAL_CAPSULE | Freq: Two times a day (BID) | ORAL | Status: DC
  Administered 2020-12-16: 300 mg via ORAL
  Filled 2020-12-15 (×2): qty 1

## 2020-12-15 NOTE — Consult Note (Addendum)
CARDIOLOGY CONSULT NOTE  Patient ID: Christian [name] MRN: [id number] DOB/AGE: 1946-04-12 75 y.o.  Admit date: 12/11/2020 Referring Physician  Shawna Clamp, MD Primary Physician:  Hoyt Koch, MD Reason for Consultation: Reduced LVEF  Patient ID: Christian [name], male    DOB: [birth year], 75 y.o.   MRN: [id number]  Chief Complaint  Patient presents with   Shortness of Breath   Cough   HPI:    Christian [name]  is a 75 y.o. old male with history of hypertension, hyperlipidemia, diabetes mellitus with stage IV CKD, CAD.  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    Presented with NSTEMI 07/2019 subsequent CABG x4 08/15/2019 with postop complications including cardiogenic shock, cardiac arrest, A. fib needing conversion, acute renal failure, midbrain infarct with left hemiparesis, and multiple blood transfusions.  Patient was discharged 10/08/2019.    Patient was most recently admitted 10/03/2020 - 10/07/2020 with acute on chronic HFpEF in the setting of CKD, at which time nephrology increased his Lasix.  He was last seen in our office 10/14/2020 at which time hydralazine was increased to 50 mg 3 times daily given uncontrolled hypertension.  Patient presented to the emergency department 12/11/2020 with worsening shortness of breath and intermittent chest pain over 2 weeks.  Evaluation in the ED revealed bilateral pleural effusions, BNP 184, troponins 22 and 21, as well as CT concerning for atypical pneumonia.  Patient was started on antibiotics as well as Lasix IV 80 mg twice daily.  Repeat echocardiogram revealed LVEF had reduced to 45-50% compared to 67% in 06/2020.  At time of exam this morning patient is up and ambulating in the room prior to sitting in the chair.  He reports over the last 2 weeks he has had worsening shortness of breath as well as intermittent right-sided chest pain localized to the 3-fourth intercostal space in the midclavicular line.  He states chest pain is worse with right  arm movement as well as tender to palpation.  Also endorses pleuritic nature of this chest pain.  He states it lasts approximately 45 minutes during each episode.  He reports chest pain has resolved since admission, stating that he has not had chest pain in the last 3 days.  He does report that at home bilateral lower leg edema had increased, however it has since resolved.  Patient's dyspnea on exertion has improved slightly since admission, however he continues to have dyspnea on exertion as well as expiratory wheezing.  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  LIMA TO LAD, RIMA TO PDA, RADIAL ARTERY TO CIRC AND SEQUENTIALLY TO OM1. 08/15/2019   Hyperlipidemia    Hypertension    Incomplete bladder emptying    Myocardial infarction ([place]) 08/09/2019   Nocturia    PE (pulmonary thromboembolism) ([place]) 08/09/2019   small RLL PE 08/09/19   Pneumonia    as a child   Pre-diabetes    Problems with swallowing pt states test at [facility] approx 2012 shows a gastric valve  dysfunction--  eats small bites and drink liquids slowly   SOB (shortness of breath) on exertion    Past Surgical History:  Procedure  Laterality Date   APPLICATION OF WOUND VAC N/A 08/24/2019   Procedure: APPLICATION OF WOUND VAC;  Surgeon: [name], MD;  Location: [place];  Service: Thoracic;  Laterality: N/A;   APPLICATION OF WOUND VAC  08/29/2019   Procedure: Wound Vac change;  Surgeon: [name], MD;  Location: [place] OR;  Service: Open Heart Surgery;;   APPLICATION OF WOUND VAC N/A 09/04/2019   Procedure: Application Of Wound Vac;  Surgeon: Grace Curly, MD;  Location: [place];  Service: Open Heart Surgery;  Laterality: N/A;   APPLICATION OF WOUND VAC N/A  09/06/2019   Procedure: APPLICATION OF ACELL, APPLICATION OF WOUND VAC USING PREVENA INCISIONAL  DRESSING;  Surgeon: Wallace Going, DO;  Location: [place];  Service: Plastics;  Laterality: N/A;   AV FISTULA PLACEMENT Left 09/18/2020   Procedure: ARTERIOVENOUS (AV) FISTULA CREATION LEFT VERSUS GRAFT;  Surgeon: Serafina Mitchell, MD;  Location: [place];  Service: Vascular;  Laterality: Left;   [place] Left 10/31/2020   Procedure: LEFT SECOND STAGE [place];  Surgeon: Serafina Mitchell, MD;  Location: [facility];  Service: Vascular;  Laterality: Left;   CARDIAC CATHETERIZATION     CORONARY ARTERY BYPASS GRAFT N/A 08/15/2019   Procedure: CORONARY ARTERY BYPASS GRAFTING (CABG), x 4 using bilateral IMAs and left radial artery .  LIMA TO LAD, RIMA TO PDA, RADIAL ARTERY TO CIRC AND SEQUENTIALLY TO OM1.;  Surgeon: [name], MD;  Location: [place];  Service: Open Heart Surgery;  Laterality: N/A;   CORONARY STENT PLACEMENT  02/27/2014   distal rt/pd coronary       dr Einar Gip   CYSTO/ BLADDER BIOPSY'S/ CAUTHERIZATION  01-14-2004  DR Gaynelle Arabian   EXPLORATION POST OPERATIVE OPEN HEART N/A 08/16/2019   Procedure: Chest Closure S?P CABG WITH APPLICATION OF PREVENA  INCISIONAL WOUND VAC;  Surgeon: [name], MD;  Location: [place];  Service: Open Heart Surgery;  Laterality: N/A;   EXPLORATION POST OPERATIVE OPEN HEART N/A 08/21/2019   Procedure: CHEST WASHOUT S/P OPEN CHEST;  Surgeon: [name], MD;  Location: [place];  Service: Open Heart Surgery;  Laterality: N/A;  Open chest with Esmark dressing with Ioban sealant coverage.   EXPLORATION POST OPERATIVE OPEN HEART N/A 08/18/2019   Procedure: EXPLORATION POST OPERATIVE OPEN HEART (performed 04/23 on unit);  Surgeon: [name], MD;  Location: [place];  Service: Open Heart Surgery;  Laterality: N/A;   EXPLORATION POST OPERATIVE OPEN HEART N/A 08/24/2019   Procedure: CHEST WASHOUT POST OPERATIVE OPEN HEART;  Surgeon:  [name], MD;  Location: [place];  Service: Open Heart Surgery;  Laterality: N/A;   EXPLORATION POST OPERATIVE OPEN HEART N/A 08/29/2019   Procedure: CHEST WOUND WASHOUT POST OPERATIVE OPEN HEART;  Surgeon: [name], MD;  Location: [place];  Service: Open Heart Surgery;  Laterality: N/A;   EXPLORATION POST OPERATIVE OPEN HEART N/A 09/04/2019   Procedure: MEDIASTINAL EXPLORATION WITH STERNAL WOUND IRRIGATION;  Surgeon: Grace Bee, MD;  Location: [place];  Service: Open Heart Surgery;  Laterality: N/A;   EXPLORATION POST OPERATIVE OPEN HEART N/A 09/14/2019   Procedure: EVACUATION OF HEMATOMA;  Surgeon: Grace Shelly, MD;  Location: [place];  Service: Open Heart Surgery;  Laterality: N/A;   IR FLUORO GUIDE CV LINE LEFT  09/19/2019   IR GASTROSTOMY TUBE MOD SED  10/04/2019   IR GASTROSTOMY TUBE REMOVAL  11/29/2019   IR PATIENT EVAL TECH 0-60 MINS  09/29/2019   IR US GUIDE VASC ACCESS LEFT  09/19/2019   LAPAROSCOPIC LYSIS OF ADHESIONS N/A 09/06/2019   Procedure: LAPAROSCOPIC OMENTAL HARVEST;  Surgeon: Kieth Brightly Arta Bruce, MD;  Location: [place];  Service: General;  Laterality: N/A;   LEFT HEART CATH AND CORONARY ANGIOGRAPHY N/A 08/10/2019   Procedure: LEFT HEART CATH AND CORONARY ANGIOGRAPHY;  Surgeon: Nigel Mormon, MD;  Location: [place] CV LAB;  Service: Cardiovascular;  Laterality: N/A;   LEFT HEART CATHETERIZATION WITH CORONARY ANGIOGRAM N/A 02/27/2014   Procedure: LEFT HEART CATHETERIZATION WITH CORONARY ANGIOGRAM;  Surgeon: Laverda Page, MD;  Location[facility] C[facility];  Service: Cardiovascular;  Laterality: N/A;   MEDIASTINAL EXPLORATION N/A 09/06/2019   Procedure: MEDIASTINAL EXPLORATION;  Surgeon: Grace Sahand, MD;  Location: [place];  Service: Thoracic;  Laterality: N/A;   PECTORALIS FLAP  09/06/2019   Procedure: Pectoralis ADVANCEMENT Flap;  Surgeon: Wallace Going, DO;  Location: [place];  Service: Plastics;;   PERCUTANEOUS CORONARY STENT INTERVENTION (PCI-S)   02/27/2014   Procedure: PERCUTANEOUS CORONARY STENT INTERVENTION (PCI-S);  Surgeon: Laverda Page, MD;  Location: [facility] CATH LAB;  Service: Cardiovascular;;  rt PDA  3.0/28mm Promus stent   RADIAL ARTERY HARVEST Left 08/15/2019   Procedure: Radial Artery Harvest;  Surgeon: [name], MD;  Location: [place];  Service: Open Heart Surgery;  Laterality: Left;   RIB PLATING N/A 09/06/2019   Procedure: STERNAL PLATING;  Surgeon: Grace Jyron, MD;  Location: [place];  Service: Thoracic;  Laterality: N/A;   TEE WITHOUT CARDIOVERSION N/A 08/15/2019   Procedure: TRANSESOPHAGEAL ECHOCARDIOGRAM (TEE);  Surgeon: [name], MD;  Location: [place];  Service: Open Heart Surgery;  Laterality: N/A;   TRACHEOSTOMY TUBE PLACEMENT  08/29/2019   Procedure: Tracheostomy;  Surgeon: [name], MD;  [facility];  Service: Open Heart Surgery; Decannulated 6/10/20211   TRANSURETHRAL RESECTION OF PROSTATE  04/04/2012   Procedure: TRANSURETHRAL RESECTION OF THE PROSTATE WITH GYRUS INSTRUMENTS;  Surgeon: Ailene Rud, MD;  Location[facility];  Service: Urology;  Laterality: N/A;   TRANSURETHRAL RESECTION OF PROSTATE N/A 09/27/2014   Procedure: TRANSURETHRAL RESECTION OF THE PROSTATE ;  Surgeon: Carolan Clines, MD;  Location: [facility];  Service: Urology;  Laterality: N/A;   UPPER GASTROINTESTINAL ENDOSCOPY     Family History  Problem Relation Age of Onset   Diabetes Mother    Hypertension Mother    Diabetes Father    Diabetes Brother    Hypertension Brother    Bone cancer Brother    Diabetes Brother    Social History   Tobacco Use   Smoking status: Former    Packs/day: 0.50    Years: 20.00    Pack years: 10.00    Types: Cigarettes    Quit date: 1975    Years since quitting: 47.6   Smokeless tobacco: Never  Substance Use Topics   Alcohol use: Not Currently    Comment: very rarely - every now and then 1 drink/1 year if that    Marital Sttus: Married  ROS   Review of Systems  Constitutional: Positive for malaise/fatigue. Negative for weight gain.  Cardiovascular:  Positive for chest pain (last recurrence 3 days ago), dyspnea on exertion and leg swelling (resolved since admission). Negative for claudication, near-syncope, orthopnea, palpitations, paroxysmal nocturnal dyspnea and syncope.  Respiratory:  Positive for cough and wheezing. Negative for hemoptysis.   Neurological:  Negative for dizziness.  All other systems reviewed and  are negative. Objective   Vitals with BMI 12/15/2020 12/15/2020 12/15/2020  Height - - -  Weight - 213 lbs 10 oz -  BMI - 58.30 -  Systolic 940 - 768  Diastolic 64 - 74  Pulse 73 - 92    Blood pressure 113/64, pulse 73, temperature 97.8 F (36.6 C), temperature source Oral, resp. rate 17, height 5\' 7"  (1.702 m), weight 96.9 kg, SpO2 97 %.    Physical Exam Vitals reviewed.  Constitutional:      General: He is not in acute distress.    Appearance: He is obese.  HENT:     Head: Normocephalic and atraumatic.     Right Ear: External ear normal.     Left Ear: External ear normal.     Mouth/Throat:     Mouth: Mucous membranes are moist.  Eyes:     Conjunctiva/sclera: Conjunctivae normal.  Neck:     Vascular: No carotid bruit or JVD.  Cardiovascular:     Rate and Rhythm: Normal rate and regular rhythm.     Pulses:          Dorsalis pedis pulses are 0 on the right side and 0 on the left side.       Posterior tibial pulses are 0 on the right side and 0 on the left side.     Heart sounds: S1 normal and S2 normal. No murmur heard.   No gallop.     Comments: Sternotomy scar well-healed. Vein graft scars well-healed. Left arm fistula Pulmonary:     Effort: Pulmonary effort is normal. No respiratory distress.     Breath sounds: Wheezing (expiratory) present. No rales.  Abdominal:     General: Bowel sounds are normal.     Tenderness: There is no abdominal tenderness.  Musculoskeletal:     Right lower leg:  Edema (trace) present.     Left lower leg: Edema (trace) present.  Skin:    General: Skin is warm and dry.  Neurological:     General: No focal deficit present.     Mental Status: He is alert and oriented to person, place, and time.   Laboratory examination:   Recent Labs    12/13/20 0133 12/14/20 0124 12/15/20 0045  NA 140 135 138  K 3.9 3.9 3.6  CL 106 103 106  CO2 24 23 25   GLUCOSE 95 98 204*  BUN 40* 43* 40*  CREATININE 2.76* 2.69* 2.76*  CALCIUM 8.8* 8.4* 8.6*  GFRNONAA 24* 24* 24*   estimated creatinine clearance is 26.4 mL/min (A) (by C-G formula based on SCr of 2.76 mg/dL (H)).  CMP Latest Ref Rng & Units 12/15/2020 12/14/2020 12/13/2020  Glucose 70 - 99 mg/dL 204(H) 98 95  BUN 8 - 23 mg/dL 40(H) 43(H) 40(H)  Creatinine 0.61 - 1.24 mg/dL 2.76(H) 2.69(H) 2.76(H)  Sodium 135 - 145 mmol/L 138 135 140  Potassium 3.5 - 5.1 mmol/L 3.6 3.9 3.9  Chloride 98 - 111 mmol/L 106 103 106  CO2 22 - 32 mmol/L 25 23 24   Calcium 8.9 - 10.3 mg/dL 8.6(L) 8.4(L) 8.8(L)  Total Protein 6.5 - 8.1 g/dL - - -  Total Bilirubin 0.3 - 1.2 mg/dL - - -  Alkaline Phos 38 - 126 U/L - - -  AST 15 - 41 U/L - - -  ALT 0 - 44 U/L - - -   CBC Latest Ref Rng & Units 12/12/2020 12/11/2020 10/31/2020  WBC 4.0 - 10.5 K/uL 8.4 9.2 -  Hemoglobin 13.0 - 17.0 g/dL 11.7(L) 10.8(L) 11.2(L)  Hematocrit 39.0 - 52.0 % 38.1(L) 35.9(L) 33.0(L)  Platelets 150 - 400 K/uL 218 203 -   Lipid Panel No results for input(s): CHOL, TRIG, LDLCALC, VLDL, HDL, CHOLHDL, LDLDIRECT in the last 8760 hours.  HEMOGLOBIN A1C Lab Results  Component Value Date   HGBA1C 5.4 03/25/2020   MPG 94 12/20/2019   TSH Recent Labs    05/28/20 1538  TSH 1.24   BNP (last 3 results) Recent Labs    10/03/20 1500 10/23/20 1204 12/11/20 1533  BNP 672.7* 83.2 184.8*   Results for orders placed or performed during the hospital encounter of 12/11/20 (from the past 48 hour(s))  Basic metabolic panel     Status: Abnormal   Collection  Time: 12/14/20  1:24 AM  Result Value Ref Range   Sodium 135 135 - 145 mmol/L   Potassium 3.9 3.5 - 5.1 mmol/L   Chloride 103 98 - 111 mmol/L   CO2 23 22 - 32 mmol/L   Glucose, Bld 98 70 - 99 mg/dL    Comment: Glucose reference range applies only to samples taken after fasting for at least 8 hours.   BUN 43 (H) 8 - 23 mg/dL   Creatinine, Ser 2.69 (H) 0.61 - 1.24 mg/dL   Calcium 8.4 (L) 8.9 - 10.3 mg/dL   GFR, Estimated 24 (L) >60 mL/min    Comment: (NOTE) Calculated using the CKD-EPI Creatinine Equation (2021)    Anion gap 9 5 - 15    Comment: Performed at [facility] [address] [place], [place] [address]  Basic metabolic panel     Status: Abnormal   Collection Time: 12/15/20 12:45 AM  Result Value Ref Range   Sodium 138 135 - 145 mmol/L   Potassium 3.6 3.5 - 5.1 mmol/L   Chloride 106 98 - 111 mmol/L   CO2 25 22 - 32 mmol/L   Glucose, Bld 204 (H) 70 - 99 mg/dL    Comment: Glucose reference range applies only to samples taken after fasting for at least 8 hours.   BUN 40 (H) 8 - 23 mg/dL   Creatinine, Ser 2.76 (H) 0.61 - 1.24 mg/dL   Calcium 8.6 (L) 8.9 - 10.3 mg/dL   GFR, Estimated 24 (L) >60 mL/min    Comment: (NOTE) Calculated using the CKD-EPI Creatinine Equation (2021)    Anion gap 7 5 - 15    Comment: Performed at [facility] [address], [place], [place] [address]    Medications and allergies  No Known Allergies   Current Meds  Medication Sig   aspirin EC 81 MG tablet Take 81 mg by mouth daily.   atorvastatin (LIPITOR) 20 MG tablet Take 1 tablet (20 mg total) by mouth every evening.   clonazePAM (KLONOPIN) 0.5 MG disintegrating tablet TAKE 1 TABLET BY MOUTH TWICE A DAY (Patient taking differently: Take 0.5 mg by mouth 2 (two) times daily.)   clopidogrel (PLAVIX) 75 MG tablet TAKE 1 TABLET BY MOUTH EVERY DAY (Patient taking differently: Take 75 mg by mouth daily.)   cromolyn (OPTICROM) 4 % ophthalmic solution Place 1 drop into both eyes every  Wednesday. At bedtime   [employer] ACETAMINOPHEN 325 MG tablet TAKE 2 CAPS BY MOUTH EVERY 6 HOURS AS NEEDED FOR PAIN FOR TEMP 100F OR ABOVE (Patient taking differently: Take 325 mg by mouth every 6 (six) hours as needed for mild pain or headache (temp over 100).)   [employer]  SENNA PLUS 8.6-50 MG tablet TAKE 2 TABLETS BY MOUTH AT BEDTIME FOR CONSTIPATION (Patient taking differently: Take 1 tablet by mouth at bedtime.)   DM-APAP-CPM (CORICIDIN HBP PO) Take 1 tablet by mouth every 6 (six) hours as needed (congestion/cough).   ferrous sulfate 325 (65 FE) MG tablet Take 325 mg by mouth daily with breakfast.   furosemide (LASIX) 40 MG tablet Take 80 mg by mouth 2 (two) times daily at 8am and 2pm.   hydrALAZINE (APRESOLINE) 25 MG tablet Take 2 tablets (50 mg total) by mouth 3 (three) times daily.   isosorbide dinitrate (ISORDIL) 30 MG tablet TAKE 1 TABLET BY MOUTH THREE TIMES A DAY (Patient taking differently: Take 30 mg by mouth 2 (two) times daily.)   metoprolol succinate (TOPROL-XL) 50 MG 24 hr tablet TAKE 3 TABLETS BY MOUTH EVERY DAY (Patient taking differently: Take 50 mg by mouth daily.)   multivitamin (RENA-VIT) TABS tablet Take 1 tablet by mouth at bedtime.   nitroGLYCERIN (NITROSTAT) 0.4 MG SL tablet Place 0.4 mg under the tongue every 5 (five) minutes as needed for chest pain.   oxyCODONE-acetaminophen (PERCOCET) 5-325 MG tablet Take 1 tablet by mouth every 6 (six) hours as needed for severe pain.   potassium chloride (KLOR-CON) 10 MEQ tablet Take 10 mEq by mouth daily.   PREVIDENT 5000 BOOSTER PLUS 1.1 % PSTE Take 1 application by mouth every evening.   sertraline (ZOLOFT) 25 MG tablet TAKE 1 TABLET ONE TIME A DAY (Patient taking differently: Take 25 mg by mouth daily.)   silodosin (RAPAFLO) 8 MG CAPS capsule TAKE 1 CAPSULE BY MOUTH EVERYDAY AT BEDTIME (Patient taking differently: Take 8 mg by mouth at bedtime.)   Vitamin D, Ergocalciferol, (DRISDOL) 1.25 MG (50000 UNIT) CAPS capsule Take 1 capsule (50,000  Units total) by mouth every 7 (seven) days. (Patient taking differently: Take 50,000 Units by mouth every Wednesday.)    Scheduled Meds:  aspirin EC  81 mg Oral Daily   atorvastatin  20 mg Oral QPM   clonazePAM  0.5 mg Oral BID   clopidogrel  75 mg Oral Daily   cromolyn  1 drop Both Eyes Q Wed   ferrous sulfate  325 mg Oral Q breakfast   furosemide  80 mg Intravenous BID   heparin  5,000 Units Subcutaneous Q8H   hydrALAZINE  50 mg Oral TID   isosorbide dinitrate  30 mg Oral BID   metoprolol succinate  50 mg Oral Daily   multivitamin  1 tablet Oral QHS   potassium chloride  10 mEq Oral Daily   senna-docusate  1 tablet Oral QHS   sertraline  25 mg Oral Daily   tamsulosin  0.4 mg Oral QHS   Continuous Infusions:  azithromycin 500 mg (12/15/20 0507)   cefTRIAXone (ROCEPHIN)  IV 2 g (12/15/20 0342)   PRN Meds:.albuterol, labetalol, oxyCODONE-acetaminophen, sodium chloride   I/O last 3 completed shifts: In: 70 [P.O.:1200; IV Piggyback:350] Out: 8756 [Urine:3450] No intake/output data recorded.    Radiology:   No results found. Chest x-ray 12/11/2020: Post median sternotomy for CABG. Cardiomediastinal contours with stable cardiac enlargement as before. Slightly diminished but persistent LEFT pleural effusion. Persistent LEFT basilar opacity in the retrocardiac region. No visible pneumothorax. No new area of consolidation. Trace RIGHT effusion.  On limited assessment no acute skeletal process.  Chest CT 12/12/2020: 1. Cluster of ground-glass nodular density in the right lower lobe most consistent with atypical pneumonia. Small, chronic appearing bilateral pleural effusions, left greater than right.  2. Partial consolidative changes of the left lung base. 3. Aortic Atherosclerosis (ICD10-I70.0).  Cardiac Studies:   Heart catheterization 2019-08-21: LM: Normal LAD: Ostial 95% stenosis Ramus: Ostial/prox 30% stenosis LCx: Ostial 75% stenosis RCA: Prox-mid diffuse 40% calcific  disease. Distal RCA 70% ISR of 3.0 by 28 mm Promus placed 02/27/2014   CABG 08/15/2019: LIMA TO LAD, RIMA TO PDA, SEQUENTIAL RADIAL ARTERY TO Cx-OM1.   Echocardiogram 07/22/2020: Left ventricle cavity is normal in size. Mild concentric hypertrophy of the left ventricle. Normal global wall motion. Normal LV systolic function with EF 67%. Normal diastolic filling pattern.  Trileaflet aortic valve with  Mild aortic valve leaflet calcification. Trace aortic valve stenosis. No regurgitation. Normal right atrial pressure.  DVT study bilateral lower extremities 12/12/2020: No evidence of DVT  Echocardiogram 12/13/2020: 1. Left ventricular ejection fraction, by estimation, is 45 to 50%. The left ventricle has low normal function. Left ventricular endocardial border not optimally defined to evaluate regional wall motion. Left ventricular diastolic function could not be evaluated. There is mild of the left ventricular, inferolateral wall.   2. Right ventricular systolic function is normal. The right ventricular size is normal.   3. Left atrial size was mildly dilated.   4. The mitral valve is normal in structure. Trivial mitral valve  regurgitation.   5. The aortic valve is tricuspid. Aortic valve regurgitation is not  visualized. Mild to moderate aortic valve sclerosis/calcification is present, without any evidence of aortic stenosis.   6. The inferior vena cava is normal in size with greater than 50% respiratory variability, suggesting right atrial pressure of 3 mmHg.   EKG: 12/11/2020: Sinus rhythm at a rate of 88 bpm.  Left axis, incomplete right bundle branch block.  Poor R wave progression, cannot exclude anteroseptal infarct old.  Unchanged compared to EKG 07/11/2020.  Assessment   Christian [name] is a 75 y.o. African-American male with history of hypertension, hyperlipidemia, diabetes mellitus with stage IV CKD, CAD status post stenting to PAD in 2015, CABG times 08/15/2019.  Now presents with  pneumonia and reduced LVEF (45-50%).   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  Due to change in LVEF as well as inferolateral wall motion abnormality would recommend nuclear stress test on outpatient basis to evaluate for reversible ischemia, given his significant history of CAD status post CABG. Patient reports intermittent right-sided chest pain over the last 2 weeks, although symptoms are suggestive of noncardiac etiology and has resolved since admission.   EKG is unchanged compared to previous.  Troponin flat.  Do not suspect ACS Patient net -1.9 L since admission. Patient is not presently on ARB/ACE inhibitor due to underlying CKD. Continue Lasix 80 mg IV twice daily Continue potassium supplements Continue Isordil 30 mg p.o. twice daily Continue hydralazine 50 mg p.o. 3 times daily Continue metoprolol succinate 50 mg p.o. daily Daily weights, strict I&O's, low-sodium diet Patient admits to dietary and medication noncompliance, discussed importance of both, including weight loss as well.  Hypertension Well-controlled Continue Isordil, hydralazine, and metoprolol succinate as above  Hyperlipidemia Continue statin therapy  CAD status post CABG times 08/15/2019 Continue dual antiplatelet therapy, beta-blocker therapy, statin therapy. Patient is not on ACE inhibitor/ARB due to underlying CKD Troponin minimally elevated and flat  at 21.  Do not suspect ACS. However given change in LVEF recommend outpatient nuclear stress test to evaluate for reversible ischemia.  CKD, stage IV Avoid nephrotoxic agents and monitor renal function closely throughout diuresis. Patient  is not presently on dialysis, however he did recently undergo fistula creation in anticipation that he may need permanent dialysis in the future. Patient follows outpatient with Dr. Royce Macadamia (nephrology).  Pneumonia Antibiotics per primary team Further management per primary team  Will continue to follow peripherally and set up for outpatient follow-up with primary cardiologist.  Please do not hesitate to reach out if additional questions arise.  Patient was seen in collaboration with Dr. Terri Skains. He also reviewed patient's chart and examined the patient. Dr. Terri Skains is in agreement of the plan.    Alethia Berthold, PA-C 12/15/2020, 11:21 AM Office: [phone]  ADDENDUM:   Shared Visit: Patient was independently interviewed and examined by me. This has been a shared visit with Lawerance Cruel, PA in consultation with Dr. Terri Skains. I reviewed the above and agree the findings and recommendations, unless noted differently below.   Very pleasant 75 year old African-American gentleman who is accompanied by his wife at bedside presents to the hospital with a chief complaint of shortness of breath and cough.  The current work-up illustrates that he has underlying pneumonia for which he is receiving antibiotics by the primary team.  He underwent an echocardiogram which noted mildly reduced LVEF with regional wall motion abnormalities which is a new finding compared to his prior echo.  Therefore attending physician Dr. Dwyane Dee requested a consult for further recommendations.  At the time of the evaluation he denies any chest pain at rest or with effort related activities.  His shortness of breath is improving since hospitalization and receiving antibiotics.  Patient states that prior to the hospitalization he did experience right-sided chest pain as described above but has not resurfaced.  He has not required any sublingual nitroglycerin tablets since last office visit.  No change in overall physical  endurance.  PHYSICAL EXAM: Vitals with BMI 12/15/2020 12/15/2020 12/15/2020  Height - - -  Weight - - 213 lbs 10 oz  BMI - - 70.26  Systolic 378 588 -  Diastolic 66 64 -  Pulse 85 73 -   CONSTITUTIONAL: Age-appropriate male, hemodynamically stable, no acute distress.   SKIN: Skin is warm and dry. No rash noted. No cyanosis. No pallor. No jaundice HEAD: Normocephalic and atraumatic.  EYES: No scleral icterus MOUTH/THROAT: Moist oral membranes.  NECK: No JVD present. No thyromegaly noted. No carotid bruits  LYMPHATIC: No visible cervical adenopathy.  CHEST Normal respiratory effort. No intercostal retractions.  Sternotomy site is well-healed. LUNGS: Decreased breath sounds bilaterally, with expiratory wheezes noted, no rhonchi's or rales. CARDIOVASCULAR: Regular, positive S1-S2, without murmurs rubs or gallops. ABDOMINAL: Soft, obese, nontender, nondistended, positive bowel sounds all 4 quadrants, no apparent ascites.  EXTREMITIES: Trace bilateral edema, warm to touch.  Thrill over the left upper extremity dialysis site.  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 Reviewed the echocardiogram personally which notes LVEF to be approximately 50% with very subtle basal inferolateral hypokinesis.  Clinically patient does not have symptoms to suggest angina pectoris or ACS, EKG is nonischemic, troponins are flat, and no change in physical endurance. Given his underlying chronic kidney disease stage IV and history of being on  hemodialysis I would medical therapy as opposed to invasive angiography at time for reasons noted above.  Antianginal therapy includes metoprolol and isosorbide dinitrate. Currently not on ACE inhibitor/ARB/given his renal function -will need to discuss w/ nephrology.  Diuretics managed by  nephorology.  Chest discomfort that he was experiencing prior to this hospitalization is most likely noncardiac; however, given his extensive cardiovascular history with regards to his recent CABG would recommend an ischemic evaluation with a stress test as outpatient.  Shortness of breath: Multifactorial Treatment of pneumonia as per your care Currently on diuretic therapy being managed by nephrology given his progressive CKD. Encourage incentive spirometer.  Established coronary artery disease with prior surgical revascularization without angina pectoris:  Discussed with both the patient and his wife that at this point would recommend medical therapy despite the echocardiographic findings given the fact that he is asymptomatic and for the reasons mentioned above.  Educated on seeking medical attention sooner by going to the closest ER via EMS if the symptoms increase in intensity, frequency, duration, or has typical chest pain.  Patient and his wife verbalized understanding.  Will arrange for outpatient follow-up post discharge to consider Lexiscan stress test to evaluate for reversible ischemia.  Plan of care discussed with the attending physician.  We will follow peripherally.  Please call sooner if change in clinical status.   This note was created using a voice recognition software as a result there may be grammatical errors inadvertently enclosed that do not reflect the nature of this encounter. Every attempt is made to correct such errors.  Total encounter time 84 minutes. *Total Encounter Time as defined by the Centers for Medicare and Medicaid Services includes, in addition to the face-to-face time of a patient visit (documented in the note above) non-face-to-face time: obtaining and reviewing outside history, ordering and reviewing medications, tests or procedures, care coordination (communications with other health care professionals or caregivers) and documentation in the medical  record.    Rex Kras, [place], [facility]  Pager: [phone] Office: [phone]

## 2020-12-15 NOTE — Progress Notes (Signed)
PROGRESS NOTE    SENDER RUEB  OZY:[id number] DOB: 1945-09-14 DOA: 12/11/2020 PCP: Hoyt Koch, MD    Brief Narrative:  This 75 years old male with PMH significant for HTN, CKD stage IV, prior PE in the setting of CABG, CABG in May 2021, recent hospitalization for CHF exacerbation and was discharged on Lasix 80 mg twice daily.  He presented with complaints of progressive shortness of breath at rest and on exertion associated with intermittent chest pain which gets worse with deep breathing and coughing.  Patient reports all the symptoms started after he got COVID-vaccine ( fourth dose) last week.  Labs include BNP 184, Trop 22> 21, chest x-ray shows left lower lobe effusion.  CT chest chronic appearing bilateral pleural effusion,  cluster of groundglass nodular density in the right lower lobe more consistent with atypical pneumonia.  COVID and flu swab negative. Patient is admitted for community-acquired pneumonia,  started on ceftriaxone and Zithromax.  Patient is also continued on Lasix 80 mg twice daily for CHF.  Renal functions at baseline,  blood pressure is better controlled.  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  LIMA TO LAD, RIMA TO PDA, RADIAL ARTERY TO CIRC AND SEQUENTIALLY TO OM1.   CKD (chronic kidney disease) stage 4, GFR 15-29 ml/min (HCC)   Atypical pneumonia   Pleural effusion   Fluid overload  Community-acquired pneumonia: Patient presented with shortness of breath at rest and on exertion. He is not hypoxic, remains on room air , sats 96%. Differential diagnoses include atypical pneumonia, fluid overload, COVID-vaccine, PE. CT chest showed chronic appearing bilateral pleural effusion, groundglass nodular density in the right lower  lobe consistent with atypical pneumonia. Continue empiric ceftriaxone and Zithromax. COVID and influenza negative.  Procalcitonin 0.48  Chronic diastolic CHF /bilateral pleural effusion: Fluid overload and CHF could be contributing to his shortness of breath. BNP 184.  Bilateral pedal edema noted. 2D echocardiogram April/22 LVEF 67%.  No regional wall motion abnormality. Repeat Echo showed LVEF reduced to 45- 50% Continue telemetry, continue Lasix 80 mg IV twice daily Continue to monitor daily weight , intake output charting. Cardiology consulted recommended outpatient stress test. He is not on ACE inhibitors or ARB's due to CKD.  History of CAD, s/p CABG Patient denies any chest pain, troponin slightly elevated could be in the setting of CKD. Continue aspirin, Plavix, Lipitor, Imdur, metoprolol  CKD stage IV: Serum creatinine baseline.  Baseline creatinine 2.3-2.7 Continue to monitor, avoid nephrotoxic medications.  Hypertension : Continue home blood pressure medication, as needed labetalol.  Elevated D-dimer: Venous duplex negative for DVT, VQ scan not needed patient is not manifesting symptoms of PE.   DVT prophylaxis: Heparin sq Code Status: Full code. Family Communication: No family at bed side. Disposition Plan:    Status is: Inpatient  Remains inpatient appropriate because:Inpatient level of care appropriate due to severity of illness  Dispo: The patient is from: Home              Anticipated d/c is to: Home              Patient currently is not medically stable to d/c.   Difficult to place patient No  Consultants:  Cardiology  Procedures: None Antimicrobials:   Anti-infectives (  From admission, onward)    Start     Dose/Rate Route Frequency Ordered Stop   12/16/20 1000  cefdinir (OMNICEF) capsule 300 mg        300 mg Oral Every 12 hours 12/15/20 1454 12/17/20 0959   12/16/20 1000  azithromycin (ZITHROMAX) tablet 250 mg        250 mg Oral Daily 12/15/20  1454 12/17/20 0959   12/13/20 0500  azithromycin (ZITHROMAX) 500 mg in sodium chloride 0.9 % 250 mL IVPB  Status:  Discontinued        500 mg 250 mL/hr over 60 Minutes Intravenous Every 24 hours 12/12/20 0218 12/15/20 1454   12/13/20 0500  cefTRIAXone (ROCEPHIN) 2 g in sodium chloride 0.9 % 100 mL IVPB  Status:  Discontinued        2 g 200 mL/hr over 30 Minutes Intravenous Every 24 hours 12/12/20 0218 12/15/20 1454   12/12/20 0115  cefTRIAXone (ROCEPHIN) 1 g in sodium chloride 0.9 % 100 mL IVPB        1 g 200 mL/hr over 30 Minutes Intravenous  Once 12/12/20 0107 12/12/20 0556   12/12/20 0115  azithromycin (ZITHROMAX) 500 mg in sodium chloride 0.9 % 250 mL IVPB        500 mg 250 mL/hr over 60 Minutes Intravenous  Once 12/12/20 0107 12/12/20 0657        Subjective: Patient was seen and examined at bedside.  Overnight events noted. Patient reports feeling much better , he reports wheezing has resolved. He denies any chest pain or shortness of breath.  Objective: Vitals:   12/15/20 0342 12/15/20 0500 12/15/20 0840 12/15/20 1322  BP: 125/74  113/64 121/66  Pulse: 92  73 85  Resp: 17  17 17   Temp: 98.2 F (36.8 C)  97.8 F (36.6 C) 97.7 F (36.5 C)  TempSrc: Oral  Oral Oral  SpO2: 98%  97% 97%  Weight:  96.9 kg    Height:        Intake/Output Summary (Last 24 hours) at 12/15/2020 1534 Last data filed at 12/15/2020 9371 Gross per 24 hour  Intake 1210 ml  Output 1600 ml  Net -390 ml   Filed Weights   12/12/20 0900 12/15/20 0500  Weight: 99.4 kg 96.9 kg    Examination:  General exam: Appears comfortable, not in any acute distress. Respiratory system: Clear to auscultation bilaterally, no wheezing. Cardiovascular system: S1-S2 heard, no murmur, regular rate and rhythm.   Gastrointestinal system: Abdomen is soft, nontender, nondistended, bowel sounds positive Central nervous system: Alert and oriented x3, no focal neurological deficits. Extremities: 1+ pitting edema noted,  no cyanosis, no clubbing. Skin: No rashes, lesions or ulcers Psychiatry: Mood and affect appropriate.    Data Reviewed: I have personally reviewed following labs and imaging studies  CBC: Recent Labs  Lab 12/11/20 1533 12/12/20 0336  WBC 9.2 8.4  NEUTROABS 6.5  --   HGB 10.8* 11.7*  HCT 35.9* 38.1*  MCV 109.8* 107.3*  PLT 203 696   Basic Metabolic Panel: Recent Labs  Lab 12/11/20 1533 12/12/20 0336 12/13/20 0133 12/14/20 0124 12/15/20 0045  NA 142 143 140 135 138  K 4.2 3.9 3.9 3.9 3.6  CL 110 106 106 103 106  CO2 26 23 24 23 25   GLUCOSE 80 96 95 98 204*  BUN 33* 32* 40* 43* 40*  CREATININE 2.43* 2.46* 2.76* 2.69* 2.76*  CALCIUM 9.1 9.8 8.8* 8.4* 8.6*   GFR: Estimated Creatinine Clearance: 26.4 mL/min (A) (  by C-G formula based on SCr of 2.76 mg/dL (H)). Liver Function Tests: No results for input(s): AST, ALT, ALKPHOS, BILITOT, PROT, ALBUMIN in the last 168 hours. No results for input(s): LIPASE, AMYLASE in the last 168 hours. No results for input(s): AMMONIA in the last 168 hours. Coagulation Profile: No results for input(s): INR, PROTIME in the last 168 hours. Cardiac Enzymes: No results for input(s): CKTOTAL, CKMB, CKMBINDEX, TROPONINI in the last 168 hours. BNP (last 3 results) Recent Labs    05/28/20 1538  PROBNP 64.0   HbA1C: No results for input(s): HGBA1C in the last 72 hours. CBG: No results for input(s): GLUCAP in the last 168 hours. Lipid Profile: No results for input(s): CHOL, HDL, LDLCALC, TRIG, CHOLHDL, LDLDIRECT in the last 72 hours. Thyroid Function Tests: No results for input(s): TSH, T4TOTAL, FREET4, T3FREE, THYROIDAB in the last 72 hours. Anemia Panel: No results for input(s): VITAMINB12, FOLATE, FERRITIN, TIBC, IRON, RETICCTPCT in the last 72 hours. Sepsis Labs: Recent Labs  Lab 12/12/20 0335 12/12/20 0336  PROCALCITON  --  <0.10  LATICACIDVEN 1.2  --     Recent Results (from the past 240 hour(s))  Resp Panel by RT-PCR (Flu  A&B, Covid) Nasopharyngeal Swab     Status: None   Collection Time: 12/11/20 11:28 PM   Specimen: Nasopharyngeal Swab; Nasopharyngeal(NP) swabs in vial transport medium  Result Value Ref Range Status   SARS Coronavirus 2 by RT PCR NEGATIVE NEGATIVE Final    Comment: (NOTE) SARS-CoV-2 target nucleic acids are NOT DETECTED.  The SARS-CoV-2 RNA is generally detectable in upper respiratory specimens during the acute phase of infection. The lowest concentration of SARS-CoV-2 viral copies this assay can detect is 138 copies/mL. A negative result does not preclude SARS-Cov-2 infection and should not be used as the sole basis for treatment or other patient management decisions. A negative result may occur with  improper specimen collection/handling, submission of specimen other than nasopharyngeal swab, presence of viral mutation(s) within the areas targeted by this assay, and inadequate number of viral copies(<138 copies/mL). A negative result must be combined with clinical observations, patient history, and epidemiological information. The expected result is Negative.  Fact Sheet for Patients:  EntrepreneurPulse.com.au  Fact Sheet for Healthcare Providers:  IncredibleEmployment.be  This test is no t yet approved or cleared by the [place] FDA and  has been authorized for detection and/or diagnosis of SARS-CoV-2 by FDA under an Emergency Use Authorization (EUA). This EUA will remain  in effect (meaning this test can be used) for the duration of the COVID-19 declaration under Section 564(b)(1) of the Act, 21 U.S.C.section 360bbb-3(b)(1), unless the authorization is terminated  or revoked sooner.       Influenza A by PCR NEGATIVE NEGATIVE Final   Influenza B by PCR NEGATIVE NEGATIVE Final    Comment: (NOTE) The Xpert Xpress SARS-CoV-2/FLU/RSV plus assay is intended as an aid in the diagnosis of influenza from Nasopharyngeal swab specimens  and should not be used as a sole basis for treatment. Nasal washings and aspirates are unacceptable for Xpert Xpress SARS-CoV-2/FLU/RSV testing.  Fact Sheet for Patients: EntrepreneurPulse.com.au  Fact Sheet for Healthcare Providers: IncredibleEmployment.be  This test is not yet approved or cleared by the [place] FDA and has been authorized for detection and/or diagnosis of SARS-CoV-2 by FDA under an Emergency Use Authorization (EUA). This EUA will remain in effect (meaning this test can be used) for the duration of the COVID-19 declaration under Section 564(b)(1) of the Act, 21 U.S.C.  section 360bbb-3(b)(1), unless the authorization is terminated or revoked.  Performed at [facility], [place] [address] [place], [place] [address]   Blood culture (routine x 2)     Status: None (Preliminary result)   Collection Time: 12/12/20  3:35 AM   Specimen: BLOOD RIGHT HAND  Result Value Ref Range Status   Specimen Description BLOOD RIGHT HAND  Final   Special Requests   Final    BOTTLES DRAWN AEROBIC AND ANAEROBIC Blood Culture adequate volume   Culture   Final    NO GROWTH 3 DAYS Performed at [facility], [place] [address] [place], [place] [address]    Report Status PENDING  Incomplete  Blood culture (routine x 2)     Status: None (Preliminary result)   Collection Time: 12/12/20  3:36 AM   Specimen: BLOOD  Result Value Ref Range Status   Specimen Description BLOOD RIGHT ANTECUBITAL  Final   Special Requests   Final    AEROBIC BOTTLE ONLY Blood Culture results may not be optimal due to an excessive volume of blood received in culture bottles   Culture   Final    NO GROWTH 3 DAYS Performed at [facility], [place] [address], [place], [place] [address]    Report Status PENDING  Incomplete     Radiology Studies: No results found.  Scheduled Meds:  aspirin EC  81 mg Oral Daily   atorvastatin  20 mg Oral QPM   [START ON  12/16/2020] azithromycin  250 mg Oral Daily   [START ON 12/16/2020] cefdinir  300 mg Oral Q12H   clonazePAM  0.5 mg Oral BID   clopidogrel  75 mg Oral Daily   cromolyn  1 drop Both Eyes Q Wed   ferrous sulfate  325 mg Oral Q breakfast   furosemide  80 mg Intravenous BID   heparin  5,000 Units Subcutaneous Q8H   hydrALAZINE  50 mg Oral TID   isosorbide dinitrate  30 mg Oral BID   metoprolol succinate  50 mg Oral Daily   multivitamin  1 tablet Oral QHS   potassium chloride  10 mEq Oral Daily   senna-docusate  1 tablet Oral QHS   sertraline  25 mg Oral Daily   tamsulosin  0.4 mg Oral QHS   Continuous Infusions:     LOS: 3 days    Time spent: 25 mins    Shawna Clamp, MD [facility]   If 7PM-7AM, please contact night-coverage

## 2020-12-16 ENCOUNTER — Telehealth: Payer: Self-pay

## 2020-12-16 LAB — BASIC METABOLIC PANEL
Anion gap: 11 (ref 5–15)
BUN: 38 mg/dL — ABNORMAL HIGH (ref 8–23)
CO2: 23 mmol/L (ref 22–32)
Calcium: 8.9 mg/dL (ref 8.9–10.3)
Chloride: 105 mmol/L (ref 98–111)
Creatinine, Ser: 2.52 mg/dL — ABNORMAL HIGH (ref 0.61–1.24)
GFR, Estimated: 26 mL/min — ABNORMAL LOW (ref >60)
Glucose, Bld: 127 mg/dL — ABNORMAL HIGH (ref 70–99)
Potassium: 3.9 mmol/L (ref 3.5–5.1)
Sodium: 139 mmol/L (ref 135–145)

## 2020-12-16 MED ORDER — ALBUTEROL SULFATE HFA 108 (90 BASE) MCG/ACT IN AERS: 2.0000 | INHALATION_SPRAY | Freq: Four times a day (QID) | RESPIRATORY_TRACT | 2 refills | Status: DC | PRN

## 2020-12-16 NOTE — Evaluation (Signed)
Physical Therapy Evaluation Patient Details Name: Christian [name] MRN: [id number] DOB: 1945/06/22 Today's Date: 12/16/2020   History of Present Illness  75yo male admitted 12/11/20 with c/o progressive SOB and DOE. Admitted for w/u. PMH CHF, CKD, CABG 2021, HLD, HTN, MI, hx PE, pre DM  Clinical Impression  Patient received in bed, pleasant and cooperative with PT, eager to go home later today. Able to mobilize on a S-min guard basis with SPC, although really did not need or consistently use it for balance with gait training. DOE but SpO2 no lower than 95% on RA, HR to 123BPM with activity. Left up in recliner with all needs met. Will benefit from skilled HHPT f/u at DC.     Follow Up Recommendations Home health PT    Equipment Recommendations  None recommended by PT    Recommendations for Other Services       Precautions / Restrictions Precautions Precautions: Fall Restrictions Weight Bearing Restrictions: No      Mobility  Bed Mobility Overal bed mobility: Needs Assistance Bed Mobility: Supine to Sit     Supine to sit: Supervision;HOB elevated     General bed mobility comments: increased time/effort    Transfers Overall transfer level: Needs assistance Equipment used: None Transfers: Sit to/from Stand Sit to Stand: Min guard         General transfer comment: min guard for safety, no physical assist given  Ambulation/Gait Ambulation/Gait assistance: Min guard Gait Distance (Feet): 120 Feet Assistive device: Straight cane Gait Pattern/deviations: Step-through pattern;Decreased step length - right;Decreased step length - left;Wide base of support;Trunk flexed Gait velocity: decreased   General Gait Details: slow but steady, intermittent use of SPC but did not need external support for balance; SpO2 no lower than 95% on RA but SOB and with more active wheezes when ambulating. HR 123BPM max  Stairs            Wheelchair Mobility    Modified Rankin  (Stroke Patients Only)       Balance Overall balance assessment: Mild deficits observed, not formally tested                                           Pertinent Vitals/Pain Pain Assessment: No/denies pain    Home Living Family/patient expects to be discharged to:: Private residence Living Arrangements: Spouse/significant other Available Help at Discharge: Family;Available 24 hours/day Type of Home: House Home Access: Stairs to enter   [employer] of Steps: front 4 STE with B rails, back 1 STE no rails (does not use the front) Home Layout: One level Home Equipment: Clinical cytogeneticist - 2 wheels;Walker - 4 wheels;Cane - single point      Prior Function Level of Independence: Independent         Comments: no recent falls, no close calls; still driving, wife handles meds and bills     Hand Dominance        Extremity/Trunk Assessment   Upper Extremity Assessment Upper Extremity Assessment: Generalized weakness    Lower Extremity Assessment Lower Extremity Assessment: Generalized weakness    Cervical / Trunk Assessment Cervical / Trunk Assessment: Kyphotic  Communication   Communication: No difficulties  Cognition Arousal/Alertness: Awake/alert Behavior During Therapy: WFL for tasks assessed/performed Overall Cognitive Status: Within Functional Limits for tasks assessed  General Comments: very pleasant and cooperative      General Comments      Exercises     Assessment/Plan    PT Assessment Patient needs continued PT services  PT Problem List Decreased strength;Cardiopulmonary status limiting activity;Decreased mobility;Decreased activity tolerance;Decreased knowledge of use of DME       PT Treatment Interventions DME instruction;Balance training;Gait training;Stair training;Functional mobility training;Patient/family education;Therapeutic activities;Therapeutic exercise    PT  Goals (Current goals can be found in the Care Plan section)  Acute Rehab PT Goals Patient Stated Goal: go home today PT Goal Formulation: With patient Time For Goal Achievement: 12/30/20 Potential to Achieve Goals: Good    Frequency Min 3X/week   Barriers to discharge        Co-evaluation               AM-PAC PT "6 Clicks" Mobility  Outcome Measure Help needed turning from your back to your side while in a flat bed without using bedrails?: A Little Help needed moving from lying on your back to sitting on the side of a flat bed without using bedrails?: A Little Help needed moving to and from a bed to a chair (including a wheelchair)?: A Little Help needed standing up from a chair using your arms (e.g., wheelchair or bedside chair)?: A Little Help needed to walk in hospital room?: A Little Help needed climbing 3-5 steps with a railing? : A Lot 6 Click Score: 17    End of Session Equipment Utilized During Treatment: Gait belt Activity Tolerance: Patient tolerated treatment well Patient left: in chair;with call bell/phone within reach Nurse Communication: Mobility status PT Visit Diagnosis: Muscle weakness (generalized) (M62.81);Difficulty in walking, not elsewhere classified (R26.2)    Time: 0911-0932 PT Time Calculation (min) (ACUTE ONLY): 21 min   Charges:   PT Evaluation $PT Eval Moderate Complexity: 1 Mod         Kristen U PT, DPT, PN2   Supplemental Physical Therapist [place]    Pager [phone] Acute Rehab Office [phone]    

## 2020-12-16 NOTE — Discharge Instructions (Signed)
Advised to follow-up with primary care physician in 1 week. Advised to follow-up with cardiology Dr. Einar Gip for outpatient Lexiscan stress test. Patient has completed treatment for community-acquired pneumonia. Advised to take albuterol inhaler as needed for wheezing. Home health services been arranged.

## 2020-12-16 NOTE — Progress Notes (Signed)
Eval complete, documentation pending- will benefit from HHPT f/u, no DME needs.   Windell Norfolk, DPT, PN2   Supplemental Physical Therapist [place]    Pager [phone] Acute Rehab Office [phone]

## 2020-12-16 NOTE — Discharge Summary (Signed)
Physician Discharge Summary  Christian [name] MPN:[id number] DOB: 1945-11-13 DOA: 12/11/2020  PCP: Hoyt Koch, MD  Admit date: 12/11/2020  Discharge date: 12/16/2020  Admitted From: Home.  Disposition: Home with home services ( home PT OT )  Recommendations for Outpatient Follow-up:  Follow up with PCP in 1-2 weeks. Please obtain BMP/CBC in one week. Advised to follow-up with cardiology Dr. Einar Gip for outpatient Lexiscan stress test. Patient has completed treatment for community-acquired pneumonia. Advised to take albuterol inhaler as needed for wheezing. Home health services been arranged.  Home Health: Home with home services. Equipment/Devices: None  Discharge Condition: Good CODE STATUS: Full code Diet recommendation: Heart Healthy    Brief Summary/ Hospital Course: This 75 years old male with PMH significant for HTN, CKD stage IV, prior PE in the setting of CABG, CABG in May 2021, recent hospitalization for CHF exacerbation and was discharged on Lasix 80 mg twice daily.  He presented with complaints of progressive shortness of breath at rest and on exertion associated with intermittent chest pain which gets worse with deep breathing and coughing.  Patient reports all the symptoms started after he got COVID-vaccine ( fourth dose) last week.  Labs include BNP 184, Trop 22> 21, chest x-ray showed left lower lobe effusion.  CT chest chronic appearing bilateral pleural effusion,  cluster of groundglass nodular density in the right lower lobe more consistent with atypical pneumonia.  COVID and flu swab negative. Patient was admitted for community-acquired pneumonia,  started on ceftriaxone and Zithromax.  Patient was also continued on Lasix 80 mg twice daily for CHF.  Renal functions at baseline,  blood pressure is better controlled.  Hospital course was unremarkable.  Repeat echocardiogram showed decreasing LVEF from 67% 4 months back to 45 to 50%.  Patient denies any chest pain,  reports breathing has improved.  He has completed antibiotics for 5 days.  Cardiology consulted for reducing LVEF given significant CAD history.  Cardiologist recommended outpatient Lexiscan stress test for ischemic work-up.  Patient feels better and want to be discharged.  Home health services been arranged.  Patient is being discharged home.  He was managed for below problems.  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  LIMA TO LAD, RIMA TO PDA, RADIAL ARTERY TO CIRC AND SEQUENTIALLY TO OM1.   CKD (chronic kidney disease) stage 4, GFR 15-29 ml/min (HCC)   Atypical pneumonia   Pleural effusion   Fluid overload  Community-acquired pneumonia: Patient presented with shortness of breath at rest and on exertion. He is not hypoxic, remains on room air , sats 96%. Differential diagnoses include atypical pneumonia, fluid overload, COVID-vaccine, PE. CT chest showed chronic appearing bilateral pleural effusion, groundglass nodular density in the right lower lobe consistent with atypical pneumonia. Completed ceftriaxone and Zithromax for 5 days. COVID and influenza negative.  Procalcitonin 0.10   Chronic diastolic CHF /bilateral pleural effusion: Fluid overload and CHF could be contributing to his shortness of breath. BNP 184.  Bilateral pedal edema noted. 2D echocardiogram April/22 LVEF 67%.  No regional wall motion abnormality. Repeat Echo showed LVEF reduced to 45- 50% Continue telemetry, continue Lasix 80 mg IV twice daily Continue to monitor daily weight , intake output charting. Cardiology consulted recommended outpatient stress test. He is not on ACE  inhibitors or ARB's due to CKD.   History of CAD, s/p CABG Patient denies any chest pain, troponin slightly  elevated could be in the setting of CKD. Continue aspirin, Plavix, Lipitor, Imdur, metoprolol   CKD stage IV: Serum creatinine baseline.  Baseline creatinine 2.3-2.7 Continue to monitor, avoid nephrotoxic medications.   Hypertension : Continue home blood pressure medication, as needed labetalol.   Elevated D-dimer: Venous duplex negative for DVT, VQ scan not needed patient is not manifesting symptoms of PE.    Discharge Instructions  Discharge Instructions     Call MD for:  difficulty breathing, headache or visual disturbances   Complete by: As directed    Call MD for:  persistant dizziness or light-headedness   Complete by: As directed    Call MD for:  persistant nausea and vomiting   Complete by: As directed    Diet - low sodium heart healthy   Complete by: As directed    Diet general   Complete by: As directed    Discharge instructions   Complete by: As directed    Advised to follow-up with cardiology Dr. Einar Gip for outpatient Lexiscan stress test. Patient has completed treatment for community-acquired pneumonia. Advised to take albuterol inhaler as needed for wheezing. Home health services been arranged.   Increase activity slowly   Complete by: As directed       Allergies as of 12/16/2020   No Known Allergies      Medication List     TAKE these medications    albuterol 108 (90 Base) MCG/ACT inhaler Commonly known as: VENTOLIN HFA Inhale 2 puffs into the lungs every 6 (six) hours as needed for wheezing or shortness of breath.   aspirin EC 81 MG tablet Take 81 mg by mouth daily.   atorvastatin 20 MG tablet Commonly known as: LIPITOR Take 1 tablet (20 mg total) by mouth every evening.   clonazePAM 0.5 MG disintegrating tablet Commonly known as: KLONOPIN TAKE 1 TABLET BY MOUTH TWICE A DAY   clopidogrel 75 MG tablet Commonly known as: PLAVIX TAKE 1 TABLET BY MOUTH EVERY DAY   CORICIDIN HBP PO Take 1 tablet by mouth every 6 (six) hours as needed  (congestion/cough).   cromolyn 4 % ophthalmic solution Commonly known as: OPTICROM Place 1 drop into both eyes every Wednesday. At bedtime   [employer] Acetaminophen 325 MG tablet Generic drug: acetaminophen TAKE 2 CAPS BY MOUTH EVERY 6 HOURS AS NEEDED FOR PAIN FOR TEMP 100F OR ABOVE What changed: See the new instructions.   [employer] Senna Plus 8.6-50 MG tablet Generic drug: senna-docusate TAKE 2 TABLETS BY MOUTH AT BEDTIME FOR CONSTIPATION What changed: See the new instructions.   ferrous sulfate 325 (65 FE) MG tablet Take 325 mg by mouth daily with breakfast.   furosemide 40 MG tablet Commonly known as: LASIX Take 80 mg by mouth 2 (two) times daily at 8am and 2pm. What changed: Another medication with the same name was removed. Continue taking this medication, and follow the directions you see here.   hydrALAZINE 25 MG tablet Commonly known as: APRESOLINE Take 2 tablets (50 mg total) by mouth 3 (three) times daily.   isosorbide dinitrate 30 MG tablet Commonly known as: ISORDIL TAKE 1 TABLET BY MOUTH THREE TIMES A DAY What changed: when to take this   metoprolol succinate 50 MG 24 hr tablet Commonly known as: TOPROL-XL TAKE 3 TABLETS BY MOUTH EVERY DAY What changed: how much to take   multivitamin Tabs tablet Take  1 tablet by mouth at bedtime.   nitroGLYCERIN 0.4 MG SL tablet Commonly known as: NITROSTAT Place 0.4 mg under the tongue every 5 (five) minutes as needed for chest pain.   oxyCODONE-acetaminophen 5-325 MG tablet Commonly known as: Percocet Take 1 tablet by mouth every 6 (six) hours as needed for severe pain.   potassium chloride 10 MEQ tablet Commonly known as: KLOR-CON Take 10 mEq by mouth daily.   PreviDent 5000 Booster Plus 1.1 % Pste Generic drug: Sodium Fluoride Take 1 application by mouth every evening.   sertraline 25 MG tablet Commonly known as: ZOLOFT TAKE 1 TABLET ONE TIME A DAY What changed: See the new instructions.   silodosin 8 MG Caps  capsule Commonly known as: RAPAFLO TAKE 1 CAPSULE BY MOUTH EVERYDAY AT BEDTIME What changed: See the new instructions.   Vitamin D (Ergocalciferol) 1.25 MG (50000 UNIT) Caps capsule Commonly known as: DRISDOL Take 1 capsule (50,000 Units total) by mouth every 7 (seven) days. What changed: when to take this        Follow-up Information     Hoyt Koch, MD Follow up in 1 week(s).   Specialty: Internal Medicine Contact information: [place] [address] [phone]         Adrian Prows, MD Follow up in 1 week(s).   Specialty: Cardiology Contact information: [place] [address] [phone]                No Known Allergies  Consultations: Cardiology   Procedures/Studies: DG Chest 2 View  Result Date: 12/11/2020 CLINICAL DATA:  Shortness of breath and chest pain in a 76 year old male. EXAM: CHEST - 2 VIEW COMPARISON:  October 06, 2020. FINDINGS: Post median sternotomy for CABG. Cardiomediastinal contours with stable cardiac enlargement as before. Slightly diminished but persistent LEFT pleural effusion. Persistent LEFT basilar opacity in the retrocardiac region. No visible pneumothorax. No new area of consolidation. Trace RIGHT effusion. On limited assessment no acute skeletal process. IMPRESSION: Persistent but slightly diminished LEFT pleural effusion and LEFT basilar opacity. Electronically Signed   By: Zetta Bills M.D.   On: 12/11/2020 16:18   CT Chest Wo Contrast  Result Date: 12/12/2020 CLINICAL DATA:  Shortness of breath. EXAM: CT CHEST WITHOUT CONTRAST TECHNIQUE: Multidetector CT imaging of the chest was performed following the standard protocol without IV contrast. COMPARISON:  Chest CT dated 09/18/2019 and radiograph dated 12/11/2020. FINDINGS: Evaluation of this exam is limited in the absence of intravenous contrast. Cardiovascular: There is no cardiomegaly or pericardial effusion. Three-vessel coronary  vascular calcification. There is moderate atherosclerotic calcification of the thoracic aorta. The central pulmonary arteries are grossly unremarkable. Mediastinum/Nodes: No hilar or mediastinal adenopathy. The esophagus is grossly unremarkable. No mediastinal fluid collection. Lungs/Pleura: Small, chronic appearing bilateral pleural effusions, left greater than right. There is partial consolidative changes of the left lung base. Pneumonia is not excluded. There is however a cluster of ground-glass nodular density in the right lower lobe most consistent with atypical pneumonia. No pneumothorax. The central airways remain patent. Upper Abdomen: Bilateral adrenal thickening/hyperplasia versus adenoma. Musculoskeletal: Median sternotomy. Degenerative changes of the shoulders bilaterally. No acute osseous pathology. IMPRESSION: 1. Cluster of ground-glass nodular density in the right lower lobe most consistent with atypical pneumonia. Small, chronic appearing bilateral pleural effusions, left greater than right. 2. Partial consolidative changes of the left lung base. 3. Aortic Atherosclerosis (ICD10-I70.0). Electronically Signed   By: Anner Crete M.D.   On: 12/12/2020  01:03   ECHOCARDIOGRAM COMPLETE  Result Date: 12/13/2020    ECHOCARDIOGRAM REPORT   Patient Name:   Christian [name] Date of Exam: 12/12/2020 Medical Rec #:  [id number]       Height:       67.0 in Accession #:    [id number]      Weight:       219.1 lb Date of Birth:  Aug 08, 1945      BSA:          2.102 m Patient Age:    12 years        BP:           115/68 mmHg Patient Gender: M               HR:           94 bpm. Exam Location:  Inpatient Procedure: 2D Echo Indications:     chest pain  History:         Patient has prior history of Echocardiogram examinations, most                  recent 08/19/2019. Prior CABG, chronic kidney disease,                  Signs/Symptoms:Shortness of Breath; Risk Factors:Dyslipidemia                  and Hypertension.   Sonographer:     Johny Chess RDCS Referring Phys:  [place] Diagnosing Phys: Adrian Prows MD IMPRESSIONS  1. Left ventricular ejection fraction, by estimation, is 45 to 50%. The left ventricle has low normal function. Left ventricular endocardial border not optimally defined to evaluate regional wall motion. Left ventricular diastolic function could not be evaluated. There is mild of the left ventricular, inferolateral wall.  2. Right ventricular systolic function is normal. The right ventricular size is normal.  3. Left atrial size was mildly dilated.  4. The mitral valve is normal in structure. Trivial mitral valve regurgitation.  5. The aortic valve is tricuspid. Aortic valve regurgitation is not visualized. Mild to moderate aortic valve sclerosis/calcification is present, without any evidence of aortic stenosis.  6. The inferior vena cava is normal in size with greater than 50% respiratory variability, suggesting right atrial pressure of 3 mmHg. FINDINGS  Left Ventricle: Left ventricular ejection fraction, by estimation, is 45 to 50%. The left ventricle has low normal function. Left ventricular endocardial border not optimally defined to evaluate regional wall motion. The left ventricular internal cavity  size was normal in size. There is no left ventricular hypertrophy. Left ventricular diastolic function could not be evaluated. Right Ventricle: The right ventricular size is normal. No increase in right ventricular wall thickness. Right ventricular systolic function is normal. Left Atrium: Left atrial size was mildly dilated. Right Atrium: Right atrial size was normal in size. Pericardium: There is no evidence of pericardial effusion. Mitral Valve: The mitral valve is normal in structure. There is mild calcification of the mitral valve leaflet(s). Mild mitral annular calcification. Trivial mitral valve regurgitation. Tricuspid Valve: The tricuspid valve is normal in structure. Tricuspid valve  regurgitation is trivial. No evidence of tricuspid stenosis. Aortic Valve: The aortic valve is tricuspid. Aortic valve regurgitation is not visualized. Mild to moderate aortic valve sclerosis/calcification is present, without any evidence of aortic stenosis. Pulmonic Valve: The pulmonic valve was not well visualized. Pulmonic valve regurgitation is not visualized. Aorta: The aortic root is normal in size and  structure. Venous: The inferior vena cava is normal in size with greater than 50% respiratory variability, suggesting right atrial pressure of 3 mmHg. IAS/Shunts: No atrial level shunt detected by color flow Doppler.  LEFT VENTRICLE PLAX 2D LVIDd:         4.30 cm     Diastology LVIDs:         3.50 cm     LV e' medial:  5.44 cm/s LV PW:         1.00 cm     LV e' lateral: 6.74 cm/s LV IVS:        1.00 cm  LV Volumes (MOD) LV vol d, MOD A4C: 44.8 ml LV vol s, MOD A4C: 22.8 ml LV SV MOD A4C:     44.8 ml IVC IVC diam: 1.50 cm LEFT ATRIUM             Index LA diam:        2.80 cm 1.33 cm/m LA Vol (A2C):   70.0 ml 33.30 ml/m LA Vol (A4C):   48.8 ml 23.21 ml/m LA Biplane Vol: 59.1 ml 28.11 ml/m  AORTIC VALVE LVOT Vmax:   97.00 cm/s LVOT Vmean:  59.000 cm/s LVOT VTI:    0.176 m  AORTA Ao Asc diam: 3.00 cm  SHUNTS Systemic VTI: 0.18 m Adrian Prows MD Electronically signed by Adrian Prows MD Signature Date/Time: 12/13/2020/4:19:40 PM    Final    VAS [place] LOWER EXTREMITY VENOUS (DVT)  Result Date: 12/12/2020  Lower Venous DVT Study Patient Name:  Christian [name]  Date of Exam:   12/12/2020 Medical Rec #: [id number]        Accession #:    [id number] Date of Birth: 1945-05-30       Patient Gender: M Patient Age:   89 years Exam Location:  [facility] Procedure:      VAS [place] LOWER EXTREMITY VENOUS (DVT) Referring Phys: Jennette Kettle --------------------------------------------------------------------------------  Indications: Edema, and SOB.  Comparison Study: Prior negative Left LEV done 10/03/2020 Performing  Technologist: Sharion Dove RVS  Examination Guidelines: A complete evaluation includes B-mode imaging, spectral Doppler, color Doppler, and power Doppler as needed of all accessible portions of each vessel. Bilateral testing is considered an integral part of a complete examination. Limited examinations for reoccurring indications may be performed as noted. The reflux portion of the exam is performed with the patient in reverse Trendelenburg.  +-----+---------------+---------+-----------+----------+--------------+ RIGHTCompressibilityPhasicitySpontaneityPropertiesThrombus Aging +-----+---------------+---------+-----------+----------+--------------+ CFV  Full           Yes      Yes                                 +-----+---------------+---------+-----------+----------+--------------+   +---------+---------------+---------+-----------+----------+--------------+ LEFT     CompressibilityPhasicitySpontaneityPropertiesThrombus Aging +---------+---------------+---------+-----------+----------+--------------+ CFV      Full           Yes      Yes                                 +---------+---------------+---------+-----------+----------+--------------+ SFJ      Full                                                        +---------+---------------+---------+-----------+----------+--------------+  FV Prox  Full                                                        +---------+---------------+---------+-----------+----------+--------------+ FV Mid   Full                                                        +---------+---------------+---------+-----------+----------+--------------+ FV DistalFull                                                        +---------+---------------+---------+-----------+----------+--------------+ PFV      Full                                                         +---------+---------------+---------+-----------+----------+--------------+ POP      Full           Yes      Yes                                 +---------+---------------+---------+-----------+----------+--------------+ PTV      Full                                                        +---------+---------------+---------+-----------+----------+--------------+ PERO     Full                                                        +---------+---------------+---------+-----------+----------+--------------+    Summary: RIGHT: - No evidence of common femoral vein obstruction.  LEFT: - There is no evidence of deep vein thrombosis in the lower extremity.  - No cystic structure found in the popliteal fossa.  *See table(s) above for measurements and observations. Electronically signed by Servando Snare MD on 12/12/2020 at 4:46:14 PM.    Final       Subjective: Patient was seen and examined at bedside.  Overnight events noted.  Patient reports feeling much improved.   Participated in physical therapy,  home health services been recommended.  Patient denies any chest pain or shortness of breath.  Discharge Exam: Vitals:   12/15/20 2320 12/16/20 0317  BP: 117/78 118/83  Pulse: 94 70  Resp: 18 18  Temp: 97.9 F (36.6 C) 98 F (36.7 C)  SpO2: 99% 94%   Vitals:   12/15/20 1942 12/15/20 2320 12/16/20 0317 12/16/20 0500  BP: (!) 148/97 117/78 118/83   Pulse: (!) 103 94 70   Resp: 17 18 18  Temp: 97.8 F (36.6 C) 97.9 F (36.6 C) 98 F (36.7 C)   TempSrc: Oral Oral Oral   SpO2: 100% 99% 94%   Weight:    98 kg  Height:        General: Pt is alert, awake, not in acute distress Cardiovascular: RRR, S1/S2 +, no rubs, no gallops Respiratory: CTA bilaterally, no wheezing, no rhonchi Abdominal: Soft, NT, ND, bowel sounds + Extremities: no edema, no cyanosis    The results of significant diagnostics from this hospitalization (including imaging, microbiology, ancillary and  laboratory) are listed below for reference.     Microbiology: Recent Results (from the past 240 hour(s))  Resp Panel by RT-PCR (Flu A&B, Covid) Nasopharyngeal Swab     Status: None   Collection Time: 12/11/20 11:28 PM   Specimen: Nasopharyngeal Swab; Nasopharyngeal(NP) swabs in vial transport medium  Result Value Ref Range Status   SARS Coronavirus 2 by RT PCR NEGATIVE NEGATIVE Final    Comment: (NOTE) SARS-CoV-2 target nucleic acids are NOT DETECTED.  The SARS-CoV-2 RNA is generally detectable in upper respiratory specimens during the acute phase of infection. The lowest concentration of SARS-CoV-2 viral copies this assay can detect is 138 copies/mL. A negative result does not preclude SARS-Cov-2 infection and should not be used as the sole basis for treatment or other patient management decisions. A negative result may occur with  improper specimen collection/handling, submission of specimen other than nasopharyngeal swab, presence of viral mutation(s) within the areas targeted by this assay, and inadequate number of viral copies(<138 copies/mL). A negative result must be combined with clinical observations, patient history, and epidemiological information. The expected result is Negative.  Fact Sheet for Patients:  EntrepreneurPulse.com.au  Fact Sheet for Healthcare Providers:  IncredibleEmployment.be  This test is no t yet approved or cleared by the [place] FDA and  has been authorized for detection and/or diagnosis of SARS-CoV-2 by FDA under an Emergency Use Authorization (EUA). This EUA will remain  in effect (meaning this test can be used) for the duration of the COVID-19 declaration under Section 564(b)(1) of the Act, 21 U.S.C.section 360bbb-3(b)(1), unless the authorization is terminated  or revoked sooner.       Influenza A by PCR NEGATIVE NEGATIVE Final   Influenza B by PCR NEGATIVE NEGATIVE Final    Comment: (NOTE) The  Xpert Xpress SARS-CoV-2/FLU/RSV plus assay is intended as an aid in the diagnosis of influenza from Nasopharyngeal swab specimens and should not be used as a sole basis for treatment. Nasal washings and aspirates are unacceptable for Xpert Xpress SARS-CoV-2/FLU/RSV testing.  Fact Sheet for Patients: EntrepreneurPulse.com.au  Fact Sheet for Healthcare Providers: IncredibleEmployment.be  This test is not yet approved or cleared by the [place] FDA and has been authorized for detection and/or diagnosis of SARS-CoV-2 by FDA under an Emergency Use Authorization (EUA). This EUA will remain in effect (meaning this test can be used) for the duration of the COVID-19 declaration under Section 564(b)(1) of the Act, 21 U.S.C. section 360bbb-3(b)(1), unless the authorization is terminated or revoked.  Performed at [facility], [place] [address] [place], [place] [address]   Blood culture (routine x 2)     Status: None (Preliminary result)   Collection Time: 12/12/20  3:35 AM   Specimen: BLOOD RIGHT HAND  Result Value Ref Range Status   Specimen Description BLOOD RIGHT HAND  Final   Special Requests   Final    BOTTLES DRAWN AEROBIC AND ANAEROBIC Blood Culture adequate volume  Culture   Final    NO GROWTH 4 DAYS Performed at [facility], [place] [address] [place], [place] [address]    Report Status PENDING  Incomplete  Blood culture (routine x 2)     Status: None (Preliminary result)   Collection Time: 12/12/20  3:36 AM   Specimen: BLOOD  Result Value Ref Range Status   Specimen Description BLOOD RIGHT ANTECUBITAL  Final   Special Requests   Final    AEROBIC BOTTLE ONLY Blood Culture results may not be optimal due to an excessive volume of blood received in culture bottles   Culture   Final    NO GROWTH 4 DAYS Performed at [facility], [place] [address], [place], [place] [address]    Report Status PENDING  Incomplete      Labs: BNP (last 3 results) Recent Labs    10/03/20 1500 10/23/20 1204 12/11/20 1533  BNP 672.7* 83.2 196.2*   Basic Metabolic Panel: Recent Labs  Lab 12/12/20 0336 12/13/20 0133 12/14/20 0124 12/15/20 0045 12/16/20 0022  NA 143 140 135 138 139  K 3.9 3.9 3.9 3.6 3.9  CL 106 106 103 106 105  CO2 23 24 23 25 23   GLUCOSE 96 95 98 204* 127*  BUN 32* 40* 43* 40* 38*  CREATININE 2.46* 2.76* 2.69* 2.76* 2.52*  CALCIUM 9.8 8.8* 8.4* 8.6* 8.9   Liver Function Tests: No results for input(s): AST, ALT, ALKPHOS, BILITOT, PROT, ALBUMIN in the last 168 hours. No results for input(s): LIPASE, AMYLASE in the last 168 hours. No results for input(s): AMMONIA in the last 168 hours. CBC: Recent Labs  Lab 12/11/20 1533 12/12/20 0336  WBC 9.2 8.4  NEUTROABS 6.5  --   HGB 10.8* 11.7*  HCT 35.9* 38.1*  MCV 109.8* 107.3*  PLT 203 218   Cardiac Enzymes: No results for input(s): CKTOTAL, CKMB, CKMBINDEX, TROPONINI in the last 168 hours. BNP: Invalid input(s): POCBNP CBG: No results for input(s): GLUCAP in the last 168 hours. D-Dimer No results for input(s): DDIMER in the last 72 hours. Hgb A1c No results for input(s): HGBA1C in the last 72 hours. Lipid Profile No results for input(s): CHOL, HDL, LDLCALC, TRIG, CHOLHDL, LDLDIRECT in the last 72 hours. Thyroid function studies No results for input(s): TSH, T4TOTAL, T3FREE, THYROIDAB in the last 72 hours.  Invalid input(s): FREET3 Anemia work up No results for input(s): VITAMINB12, FOLATE, FERRITIN, TIBC, IRON, RETICCTPCT in the last 72 hours. Urinalysis    Component Value Date/Time   COLORURINE YELLOW 08/14/2019 2140   APPEARANCEUR HAZY (A) 08/14/2019 2140   LABSPEC 1.006 08/14/2019 2140   PHURINE 6.0 08/14/2019 2140   GLUCOSEU 150 (A) 08/14/2019 2140   HGBUR MODERATE (A) 08/14/2019 2140   BILIRUBINUR NEGATIVE 08/14/2019 2140   KETONESUR NEGATIVE 08/14/2019 2140   PROTEINUR NEGATIVE 08/14/2019 2140   NITRITE NEGATIVE  08/14/2019 2140   LEUKOCYTESUR LARGE (A) 08/14/2019 2140   Sepsis Labs Invalid input(s): PROCALCITONIN,  WBC,  LACTICIDVEN Microbiology Recent Results (from the past 240 hour(s))  Resp Panel by RT-PCR (Flu A&B, Covid) Nasopharyngeal Swab     Status: None   Collection Time: 12/11/20 11:28 PM   Specimen: Nasopharyngeal Swab; Nasopharyngeal(NP) swabs in vial transport medium  Result Value Ref Range Status   SARS Coronavirus 2 by RT PCR NEGATIVE NEGATIVE Final    Comment: (NOTE) SARS-CoV-2 target nucleic acids are NOT DETECTED.  The SARS-CoV-2 RNA is generally detectable in upper respiratory specimens during the acute phase  of infection. The lowest concentration of SARS-CoV-2 viral copies this assay can detect is 138 copies/mL. A negative result does not preclude SARS-Cov-2 infection and should not be used as the sole basis for treatment or other patient management decisions. A negative result may occur with  improper specimen collection/handling, submission of specimen other than nasopharyngeal swab, presence of viral mutation(s) within the areas targeted by this assay, and inadequate number of viral copies(<138 copies/mL). A negative result must be combined with clinical observations, patient history, and epidemiological information. The expected result is Negative.  Fact Sheet for Patients:  EntrepreneurPulse.com.au  Fact Sheet for Healthcare Providers:  IncredibleEmployment.be  This test is no t yet approved or cleared by the [place] FDA and  has been authorized for detection and/or diagnosis of SARS-CoV-2 by FDA under an Emergency Use Authorization (EUA). This EUA will remain  in effect (meaning this test can be used) for the duration of the COVID-19 declaration under Section 564(b)(1) of the Act, 21 U.S.C.section 360bbb-3(b)(1), unless the authorization is terminated  or revoked sooner.       Influenza A by PCR NEGATIVE NEGATIVE  Final   Influenza B by PCR NEGATIVE NEGATIVE Final    Comment: (NOTE) The Xpert Xpress SARS-CoV-2/FLU/RSV plus assay is intended as an aid in the diagnosis of influenza from Nasopharyngeal swab specimens and should not be used as a sole basis for treatment. Nasal washings and aspirates are unacceptable for Xpert Xpress SARS-CoV-2/FLU/RSV testing.  Fact Sheet for Patients: EntrepreneurPulse.com.au  Fact Sheet for Healthcare Providers: IncredibleEmployment.be  This test is not yet approved or cleared by the [place] FDA and has been authorized for detection and/or diagnosis of SARS-CoV-2 by FDA under an Emergency Use Authorization (EUA). This EUA will remain in effect (meaning this test can be used) for the duration of the COVID-19 declaration under Section 564(b)(1) of the Act, 21 U.S.C. section 360bbb-3(b)(1), unless the authorization is terminated or revoked.  Performed at [facility], [place] [address] [place], [place] [address]   Blood culture (routine x 2)     Status: None (Preliminary result)   Collection Time: 12/12/20  3:35 AM   Specimen: BLOOD RIGHT HAND  Result Value Ref Range Status   Specimen Description BLOOD RIGHT HAND  Final   Special Requests   Final    BOTTLES DRAWN AEROBIC AND ANAEROBIC Blood Culture adequate volume   Culture   Final    NO GROWTH 4 DAYS Performed at [facility], [place] [address] [place], [place] [address]    Report Status PENDING  Incomplete  Blood culture (routine x 2)     Status: None (Preliminary result)   Collection Time: 12/12/20  3:36 AM   Specimen: BLOOD  Result Value Ref Range Status   Specimen Description BLOOD RIGHT ANTECUBITAL  Final   Special Requests   Final    AEROBIC BOTTLE ONLY Blood Culture results may not be optimal due to an excessive volume of blood received in culture bottles   Culture   Final    NO GROWTH 4 DAYS Performed at [facility], [place] [address], [place], [place] [address]    Report Status PENDING  Incomplete     Time coordinating discharge: Over 30 minutes  SIGNED:   Shawna Clamp, MD  [facility] 12/16/2020, 8:57 AM Pager   If 7PM-7AM, please contact night-coverage [email or web address] Password [email or web address]

## 2020-12-16 NOTE — TOC Initial Note (Addendum)
Transition of Care [facility]) - Initial/Assessment Note    Patient Details  Name: Christian [name] MRN: [id number] Date of Birth: 1945/09/20  [facility] CM/SW Contact:    Marilu Favre, RN Phone Number: 12/16/2020, 10:32 AM  Clinical Narrative:                 spoke to patient , discussed HHPT/OT he is agreeable. He has had home health in the past and would like the same people if possible. He does not remember the name of the agency. Patient asked NCM to call his wife Pamala Hurry at [phone] and she will know. NCM called Pamala Hurry agency is [facility] [place] [facility] , explained they changed name to [facility]. NCM called Stacie with [facility] awaiting call back.   Stacie with [facility] accepted referral  Expected Discharge Plan: [place]     Patient Goals and CMS Choice Patient states their goals for this hospitalization and ongoing recovery are:: to go home CMS Medicare.gov Compare Post Acute Care list provided to:: Patient Choice offered to / list presented to : Patient, Spouse  Expected Discharge Plan and Services Expected Discharge Plan: [place]   Discharge Planning Services: CM Consult Post Acute Care Choice: [place] arrangements for the past 2 months: Single Family Home Expected Discharge Date: 12/16/20               DME Arranged: N/A         HH Arranged: OT, PT HH Agency: [facility]) Date [place]: 12/16/20 Time [place]: 59 Representative spoke with at [place]: B and E left message  Prior Living Arrangements/Services Living arrangements for the past 2 months: [place] with:: Spouse Patient language and need for interpreter reviewed:: Yes        Need for Family Participation in Patient Care: Yes (Comment) Care giver support system in place?: Yes (comment)   Criminal Activity/Legal Involvement Pertinent to Current  Situation/Hospitalization: No - Comment as needed  Activities of Daily Living      Permission Sought/Granted   Permission granted to share information with : Yes, Verbal Permission Granted  Share Information with NAME: Paulette Lynch wife           Emotional Assessment Appearance:: Appears stated age Attitude/Demeanor/Rapport: Engaged Affect (typically observed): Accepting Orientation: : Oriented to Self, Oriented to Place, Oriented to  Time Alcohol / Substance Use: Not Applicable Psych Involvement: No (comment)  Admission diagnosis:  Pleural effusion [J90] Atypical pneumonia [J18.9] Community acquired pneumonia, unspecified laterality [J18.9] Patient Active Problem List   Diagnosis Date Noted   Atypical pneumonia 12/12/2020   Pleural effusion 12/12/2020   Fluid overload 12/12/2020   Pulmonary edema 10/03/2020   Sleep difficulties 05/30/2020   Poor appetite 05/30/2020   Anemia due to blood loss 04/24/2020   CKD (chronic kidney disease) stage 4, GFR 15-29 ml/min (HCC) 03/27/2020   Aortic atherosclerosis ([place]) 03/27/2020   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   Pruritus, unspecified 11/28/2019   Unspecified protein-calorie malnutrition ([place]) 10/27/2019   Secondary hyperparathyroidism of renal origin ([place]) 10/17/2019   Coronary artery disease 08/15/2019   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  LIMA TO LAD, RIMA TO PDA, RADIAL ARTERY TO CIRC AND SEQUENTIALLY TO OM1. 08/15/2019   Ischemic cardiomyopathy    Swelling of lower extremity    History  of coronary artery stent placement    Pulmonary embolism ([place]) 08/09/2019   Elevated liver enzymes 08/09/2019   Left leg weakness 11/23/2018   CAD (coronary artery disease) 07/30/2018   Other fatigue 10/19/2017   Routine general medical examination at a health care facility 08/30/2016   Diabetes mellitus with polyneuropathy ([place]) 10/10/2014   Benign prostatic hyperplasia 09/27/2014    Stable angina pectoris ([place]) 02/27/2014   S/P PTCA (percutaneous transluminal coronary angioplasty) 02/27/2014   Hyperlipidemia associated with type 2 diabetes mellitus ([place]) 11/12/2009   MULTIPLE SCLEROSIS 11/12/2009   HTN (hypertension) 11/12/2009   Shortness of breath 11/12/2009   PCP:  Hoyt Koch, MD Pharmacy:   [id number] - [place], [place] - [place] [address] [place] [address] Phone: [phone] Fax: [phone]     Social Determinants of Health (SDOH) Interventions    Readmission Risk Interventions No flowsheet data found.

## 2020-12-16 NOTE — Telephone Encounter (Signed)
Called patient, regarding TOC follow up call. NA, LMAM.

## 2020-12-16 NOTE — Care Management Important Message (Signed)
Important Message  Patient Details  Name: Christian [name] MRN: [id number] Date of Birth: 1945/09/14   Medicare Important Message Given:  Yes     Orbie Pyo 12/16/2020, 4:17 PM

## 2020-12-17 ENCOUNTER — Telehealth: Payer: Self-pay

## 2020-12-17 LAB — CULTURE, BLOOD (ROUTINE X 2)
Culture: NO GROWTH
Culture: NO GROWTH
Special Requests: ADEQUATE

## 2020-12-17 NOTE — Telephone Encounter (Signed)
I am not aware that he has catheter, does he have a urologist or follow up with urology for this?

## 2020-12-17 NOTE — Telephone Encounter (Cosign Needed)
Transition Care Management Follow-up Telephone Call Date of discharge and from where: [place] 12/16/20 How have you been since you were released from the hospital? Doing ok Any questions or concerns? No  Items Reviewed: Did the pt receive and understand the discharge instructions provided? Yes  Medications obtained and verified? Yes  Other? No  Any new allergies since your discharge? No  Dietary orders reviewed? Yes Do you have support at home? Yes   Home Care and Equipment/Supplies: Were home health services ordered? yes If so, what is the name of the agency? Pt wants to follow back up with [facility]  Has the agency set up a time to come to the patient's home? no Were any new equipment or medical supplies ordered?  No What is the name of the medical supply agency?  Were you able to get the supplies/equipment? not applicable Do you have any questions related to the use of the equipment or supplies? No  Functional Questionnaire: (I = Independent and D = Dependent) ADLs: I  Bathing/Dressing- I  Meal Prep- I  Eating- I  Maintaining continence- I  Transferring/Ambulation- I  Managing Meds- I  Follow up appointments reviewed:  PCP Hospital f/u appt confirmed? No  Pt wife will call to make appt today 12/17/20 Specialist Hospital f/u appt confirmed? No  pt aware to make HFU with cardiology per discharge instructions Are transportation arrangements needed? No  If their condition worsens, is the pt aware to call PCP or go to the Emergency Dept.? Yes Was the patient provided with contact information for the PCP's office or ED? Yes Was to pt encouraged to call back with questions or concerns? Yes

## 2020-12-17 NOTE — Telephone Encounter (Signed)
Location of hospitalization: [facility] Reason for hospitalization:Trouble breathing and chest pain Date of discharge: 12/17/2020 Date of first communication with patient: today Person contacting patient: Gaye Alken, CMA Current symptoms: mild chest pains Do you understand why you were in the Hospital: Yes Questions regarding discharge instructions: None Where were you discharged to: Home Medications reviewed: Yes Allergies reviewed: Yes Dietary changes reviewed: Yes. Discussed low fat and low salt diet.  Referals reviewed: NA Activities of Daily Living: Able to with mild limitations Any transportation issues/concerns: Unsure Any patient concerns: None Confirmed importance & date/time of Follow up appt: Yes Confirmed with patient if condition begins to worsen call. Pt was given the office number and encouraged to call back with questions or concerns: Yes

## 2020-12-18 ENCOUNTER — Encounter: Payer: Self-pay | Admitting: Student

## 2020-12-18 ENCOUNTER — Other Ambulatory Visit: Payer: Self-pay

## 2020-12-18 ENCOUNTER — Ambulatory Visit: Payer: Federal, State, Local not specified - PPO | Admitting: Student

## 2020-12-18 VITALS — BP 144/70 | HR 91 | Ht 67.0 in | Wt 208.0 lb

## 2020-12-18 DIAGNOSIS — I119 Hypertensive heart disease without heart failure: Secondary | ICD-10-CM

## 2020-12-18 DIAGNOSIS — I255 Ischemic cardiomyopathy: Secondary | ICD-10-CM

## 2020-12-18 DIAGNOSIS — R079 Chest pain, unspecified: Secondary | ICD-10-CM | POA: Diagnosis not present

## 2020-12-18 MED ORDER — HYDRALAZINE HCL 100 MG PO TABS: 100.0000 mg | ORAL_TABLET | Freq: Three times a day (TID) | ORAL | 3 refills | Status: DC

## 2020-12-18 NOTE — Progress Notes (Signed)
Primary Physician/Referring:  Hoyt Koch, MD  Patient ID: Christian [name], male    DOB: [birth year], 75 y.o.   MRN: [id number]  Chief Complaint  Patient presents with   Coronary Artery Disease   Follow-up   Chest Pain   Shortness of Breath   HPI:    Christian [name]  is a 75 y.o. AA male with hypertension, hyperlipidemia, DM with stage 3 CKD,  CAD with  stenting to the PDA on 02/27/2014. Patient presented with unstable angina and NSTEMI on 08/09/2019 along with a very small subsegmental pulmonary embolism and discharged on 10/18/2019 after he underwent CABG x4 on 08/15/2019.   He had extremely complex postop complications including cardiogenic shock, cardiac arrest, needing internal CPR in the ICU after opening the chest, A. Fib needing cardioversion, closure office chest wall, developed acute renal failure leading to need for permanent dialysis, right brain infarct with left hemiparesis, multiple blood transfusions.  He was also placed on dialysis however is now off of dialysis since 02/01/2020.   Patient hospitalized 10/03/2020 - 10/07/2020 with acute decompensated diastolic heart failure in the setting of CKD stage IV.  Patient recently hospitalized again 12/12/2020 - 12/16/2020 with pneumonia.  Echocardiogram revealed mildly reduced LVEF of 45-50% and concern for inferolateral wall motion abnormality.  Patient now presents for follow up.  Patient reports his dyspnea has significantly improved since discharge, however he continues to have intermittent wheezing for which he is using albuterol inhaler as needed with relief.  He also notes the last night his blood pressure was elevated at 361 mmHg systolic.  He reports medication compliance with medications as directed at discharge.  Patient reports he has been making an effort to change his diet, however this remains difficult.  He is closely followed by nephrology for chronic kidney disease as well as anemia related to this.  He also has  upcoming appointment with PCP next week.  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  LIMA TO LAD, RIMA TO PDA, RADIAL ARTERY TO CIRC AND SEQUENTIALLY TO OM1. 08/15/2019   Hyperlipidemia    Hypertension    Incomplete bladder emptying    Myocardial infarction ([place]) 08/09/2019   Nocturia    PE (pulmonary thromboembolism) ([place]) 08/09/2019   small RLL PE 08/09/19   Pneumonia    as a child   Pre-diabetes    Problems with swallowing pt states test at [facility] approx 2012 shows a gastric valve  dysfunction--  eats small bites and drink liquids slowly   SOB (shortness of breath) on exertion    Past Surgical History:  Procedure Laterality Date   APPLICATION OF WOUND VAC N/A 08/24/2019   Procedure: APPLICATION OF WOUND VAC;  Surgeon: [name], MD;  Location: [place];  Service: Thoracic;  Laterality: N/A;   APPLICATION OF WOUND VAC  08/29/2019   Procedure: Wound Vac change;  Surgeon: [name], MD;  Location: [place] [facility];  Service: Open Heart Surgery;;   APPLICATION OF WOUND VAC N/A 09/04/2019   Procedure: Application Of Wound Vac;  Surgeon: Grace Yuan, MD;  Location: [place];  Service: Open Heart Surgery;  Laterality:  N/A;   APPLICATION OF WOUND VAC N/A 09/06/2019   Procedure: APPLICATION OF ACELL, APPLICATION OF WOUND VAC USING PREVENA INCISIONAL  DRESSING;  Surgeon: Wallace Going, DO;  Location: [place];  Service: Plastics;  Laterality: N/A;   AV FISTULA PLACEMENT Left 09/18/2020   Procedure: ARTERIOVENOUS (AV) FISTULA CREATION LEFT VERSUS GRAFT;  Surgeon: Serafina Mitchell, MD;  Location: [place];  Service: Vascular;  Laterality: Left;   [place] Left 10/31/2020   Procedure: LEFT SECOND STAGE [place];  Surgeon: Serafina Mitchell, MD;  Location: [facility];  Service: Vascular;  Laterality: Left;   CARDIAC CATHETERIZATION     CORONARY ARTERY BYPASS GRAFT N/A 08/15/2019   Procedure: CORONARY ARTERY BYPASS GRAFTING (CABG), x 4 using bilateral IMAs and left radial artery .  LIMA TO LAD, RIMA TO PDA, RADIAL ARTERY TO CIRC AND SEQUENTIALLY TO OM1.;  Surgeon: [name], MD;  Location: [place];  Service: Open Heart Surgery;  Laterality: N/A;   CORONARY STENT PLACEMENT  02/27/2014   distal rt/pd coronary       dr Einar Gip   CYSTO/ BLADDER BIOPSY'S/ CAUTHERIZATION  01-14-2004  DR Gaynelle Arabian   EXPLORATION POST OPERATIVE OPEN HEART N/A 08/16/2019   Procedure: Chest Closure S?P CABG WITH APPLICATION OF PREVENA  INCISIONAL WOUND VAC;  Surgeon: [name], MD;  Location: [place];  Service: Open Heart Surgery;  Laterality: N/A;   EXPLORATION POST OPERATIVE OPEN HEART N/A 08/21/2019   Procedure: CHEST WASHOUT S/P OPEN CHEST;  Surgeon: [name], MD;  Location: [place];  Service: Open Heart Surgery;  Laterality: N/A;  Open chest with Esmark dressing with Ioban sealant coverage.   EXPLORATION POST OPERATIVE OPEN HEART N/A 08/18/2019   Procedure: EXPLORATION POST OPERATIVE OPEN HEART (performed 04/23 on unit);  Surgeon: [name], MD;  Location: [place];  Service: Open Heart Surgery;  Laterality: N/A;   EXPLORATION POST OPERATIVE OPEN HEART N/A 08/24/2019   Procedure: CHEST WASHOUT POST OPERATIVE OPEN HEART;  Surgeon: [name], MD;  Location: [place];  Service: Open Heart Surgery;  Laterality: N/A;   EXPLORATION POST OPERATIVE OPEN HEART N/A 08/29/2019   Procedure: CHEST WOUND WASHOUT POST OPERATIVE OPEN HEART;  Surgeon: [name], MD;  Location: [place];  Service: Open Heart Surgery;  Laterality: N/A;   EXPLORATION POST OPERATIVE OPEN HEART N/A 09/04/2019   Procedure: MEDIASTINAL EXPLORATION WITH STERNAL WOUND IRRIGATION;  Surgeon: Grace Izreal, MD;  Location: [place];   Service: Open Heart Surgery;  Laterality: N/A;   EXPLORATION POST OPERATIVE OPEN HEART N/A 09/14/2019   Procedure: EVACUATION OF HEMATOMA;  Surgeon: Grace Navon, MD;  Location: [place];  Service: Open Heart Surgery;  Laterality: N/A;   IR FLUORO GUIDE CV LINE LEFT  09/19/2019   IR GASTROSTOMY TUBE MOD SED  10/04/2019   IR GASTROSTOMY TUBE REMOVAL  11/29/2019   IR PATIENT EVAL TECH 0-60 MINS  09/29/2019   IR US GUIDE VASC ACCESS LEFT  09/19/2019   LAPAROSCOPIC LYSIS OF ADHESIONS N/A 09/06/2019   Procedure: LAPAROSCOPIC OMENTAL HARVEST;  Surgeon: Mickeal Skinner, MD;  Location: [place];  Service: General;  Laterality: N/A;   LEFT HEART CATH AND CORONARY ANGIOGRAPHY N/A 08/10/2019   Procedure: LEFT HEART CATH AND CORONARY ANGIOGRAPHY;  Surgeon: Nigel Mormon, MD;  Location: [place] [facility];  Service: Cardiovascular;  Laterality: N/A;   LEFT HEART CATHETERIZATION WITH CORONARY ANGIOGRAM N/A 02/27/2014   Procedure: LEFT HEART  CATHETERIZATION WITH CORONARY ANGIOGRAM;  Surgeon: Laverda Page, MD;  Location: [facility] C[facility];  Service: Cardiovascular;  Laterality: N/A;   MEDIASTINAL EXPLORATION N/A 09/06/2019   Procedure: MEDIASTINAL EXPLORATION;  Surgeon: Grace Kristof, MD;  Location: [place];  Service: Thoracic;  Laterality: N/A;   PECTORALIS FLAP  09/06/2019   Procedure: Pectoralis ADVANCEMENT Flap;  Surgeon: Wallace Going, DO;  Location: [place];  Service: Plastics;;   PERCUTANEOUS CORONARY STENT INTERVENTION (PCI-S)  02/27/2014   Procedure: PERCUTANEOUS CORONARY STENT INTERVENTION (PCI-S);  Surgeon: Laverda Page, MD;  Location[facility];  Service: Cardiovascular;;  rt PDA  3.0/28mm Promus stent   RADIAL ARTERY HARVEST Left 08/15/2019   Procedure: Radial Artery Harvest;  Surgeon: [name], MD;  Location: [place];  Service: Open Heart Surgery;  Laterality: Left;   RIB PLATING N/A 09/06/2019   Procedure: STERNAL PLATING;  Surgeon: Grace Reeve, MD;  Location: [place];   Service: Thoracic;  Laterality: N/A;   TEE WITHOUT CARDIOVERSION N/A 08/15/2019   Procedure: TRANSESOPHAGEAL ECHOCARDIOGRAM (TEE);  Surgeon: [name], MD;  Location: [place];  Service: Open Heart Surgery;  Laterality: N/A;   TRACHEOSTOMY TUBE PLACEMENT  08/29/2019   Procedure: Tracheostomy;  Surgeon: [name], MD;  [facility];  Service: Open Heart Surgery; Decannulated 6/10/20211   TRANSURETHRAL RESECTION OF PROSTATE  04/04/2012   Procedure: TRANSURETHRAL RESECTION OF THE PROSTATE WITH GYRUS INSTRUMENTS;  Surgeon: Ailene Rud, MD;  [facility];  Service: Urology;  Laterality: N/A;   TRANSURETHRAL RESECTION OF PROSTATE N/A 09/27/2014   Procedure: TRANSURETHRAL RESECTION OF THE PROSTATE ;  Surgeon: Carolan Clines, MD;  Location: [facility];  Service: Urology;  Laterality: N/A;   UPPER GASTROINTESTINAL ENDOSCOPY     Family History  Problem Relation Age of Onset   Diabetes Mother    Hypertension Mother    Diabetes Father    Diabetes Brother    Hypertension Brother    Bone cancer Brother    Diabetes Brother    Social History   Tobacco Use   Smoking status: Former    Packs/day: 0.50    Years: 20.00    Pack years: 10.00    Types: Cigarettes    Quit date: 1975    Years since quitting: 47.6   Smokeless tobacco: Never  Substance Use Topics   Alcohol use: Not Currently    Comment: very rarely - every now and then 1 drink/1 year if that   ROS   Review of Systems  Constitutional: Negative for malaise/fatigue.  Cardiovascular:  Positive for chest pain (right sided, brief, intermittent, non-exertional). Negative for dyspnea on exertion, leg swelling, orthopnea and paroxysmal nocturnal dyspnea.  Musculoskeletal:  Positive for joint pain.  Gastrointestinal:  Negative for melena.  Neurological:  Positive for disturbances in coordination (left sided weakness) and loss of balance.  Objective  Blood pressure (!) 144/70, pulse 91, height 5\' 7"   (1.702 m), weight 208 lb (94.3 kg), SpO2 95 %.  Vitals with BMI 12/18/2020 12/18/2020 12/16/2020  Height - 5\' 7"  -  Weight - 208 lbs -  BMI - 36.64 -  Systolic 403 474 259  Diastolic 70 81 72  Pulse 91 79 98    Physical Exam Vitals reviewed.  Constitutional:      Comments: He is moderately built and mildly obese in no acute distress.  HENT:     Head: Normocephalic and atraumatic.  Neck:     Thyroid: No  thyromegaly.  Cardiovascular:     Rate and Rhythm: Normal rate and regular rhythm.     Pulses:          Carotid pulses are 2+ on the right side and 2+ on the left side.      Femoral pulses are 2+ on the right side and 2+ on the left side.      Popliteal pulses are 0 on the right side and 0 on the left side.       Dorsalis pedis pulses are 0 on the right side and 0 on the left side.       Posterior tibial pulses are 0 on the right side and 0 on the left side.     Heart sounds: Normal heart sounds, S1 normal and S2 normal. No murmur heard.   No gallop.     Comments: No JVD Pulmonary:     Effort: Pulmonary effort is normal. No respiratory distress.     Breath sounds: Normal breath sounds. No wheezing, rhonchi or rales.  Chest:     Comments: Sternotomy scar has healed well Musculoskeletal:     Right lower leg: Edema (trace) present.     Left lower leg: Edema (trace) present.  Neurological:     Mental Status: He is alert.    Laboratory examination:   Recent Labs    12/14/20 0124 12/15/20 0045 12/16/20 0022  NA 135 138 139  K 3.9 3.6 3.9  CL 103 106 105  CO2 23 25 23   GLUCOSE 98 204* 127*  BUN 43* 40* 38*  CREATININE 2.69* 2.76* 2.52*  CALCIUM 8.4* 8.6* 8.9  GFRNONAA 24* 24* 26*   estimated creatinine clearance is 28.6 mL/min (A) (by C-G formula based on SCr of 2.52 mg/dL (H)).  CMP Latest Ref Rng & Units 12/16/2020 12/15/2020 12/14/2020  Glucose 70 - 99 mg/dL 127(H) 204(H) 98  BUN 8 - 23 mg/dL 38(H) 40(H) 43(H)  Creatinine 0.61 - 1.24 mg/dL 2.52(H) 2.76(H) 2.69(H)   Sodium 135 - 145 mmol/L 139 138 135  Potassium 3.5 - 5.1 mmol/L 3.9 3.6 3.9  Chloride 98 - 111 mmol/L 105 106 103  CO2 22 - 32 mmol/L 23 25 23   Calcium 8.9 - 10.3 mg/dL 8.9 8.6(L) 8.4(L)  Total Protein 6.5 - 8.1 g/dL - - -  Total Bilirubin 0.3 - 1.2 mg/dL - - -  Alkaline Phos 38 - 126 U/L - - -  AST 15 - 41 U/L - - -  ALT 0 - 44 U/L - - -   CBC Latest Ref Rng & Units 12/12/2020 12/11/2020 10/31/2020  WBC 4.0 - 10.5 K/uL 8.4 9.2 -  Hemoglobin 13.0 - 17.0 g/dL 11.7(L) 10.8(L) 11.2(L)  Hematocrit 39.0 - 52.0 % 38.1(L) 35.9(L) 33.0(L)  Platelets 150 - 400 K/uL 218 203 -   Lipid Panel No results for input(s): CHOL, TRIG, LDLCALC, VLDL, HDL, CHOLHDL, LDLDIRECT in the last 8760 hours.  HEMOGLOBIN A1C Lab Results  Component Value Date   HGBA1C 5.4 03/25/2020   MPG 94 12/20/2019   TSH Recent Labs    05/28/20 1538  TSH 1.24   BNP    Component Value Date/Time   BNP 184.8 (H) 12/11/2020 1533   ProBNP    Component Value Date/Time   PROBNP 64.0 05/28/2020 1538   Allergies  No Known Allergies   Medications Prior to Visit:   Outpatient Medications Prior to Visit  Medication Sig Dispense Refill   albuterol (VENTOLIN HFA) 108 (90 Base) MCG/ACT inhaler  Inhale 2 puffs into the lungs every 6 (six) hours as needed for wheezing or shortness of breath. 8 g 2   aspirin EC 81 MG tablet Take 81 mg by mouth daily.     atorvastatin (LIPITOR) 20 MG tablet Take 1 tablet (20 mg total) by mouth every evening. 90 tablet 3   clonazePAM (KLONOPIN) 0.5 MG disintegrating tablet TAKE 1 TABLET BY MOUTH TWICE A DAY (Patient taking differently: Take 0.5 mg by mouth 2 (two) times daily.) 60 tablet 5   clopidogrel (PLAVIX) 75 MG tablet TAKE 1 TABLET BY MOUTH EVERY DAY (Patient taking differently: Take 75 mg by mouth daily.) 90 tablet 1   cromolyn (OPTICROM) 4 % ophthalmic solution Place 1 drop into both eyes every Wednesday. At bedtime     [employer] ACETAMINOPHEN 325 MG tablet TAKE 2 CAPS BY MOUTH EVERY 6 HOURS AS  NEEDED FOR PAIN FOR TEMP 100F OR ABOVE (Patient taking differently: Take 325 mg by mouth every 6 (six) hours as needed for mild pain or headache (temp over 100).) 60 tablet 0   [employer] SENNA PLUS 8.6-50 MG tablet TAKE 2 TABLETS BY MOUTH AT BEDTIME FOR CONSTIPATION (Patient taking differently: Take 1 tablet by mouth at bedtime.) 60 tablet 1   DM-APAP-CPM (CORICIDIN HBP PO) Take 1 tablet by mouth every 6 (six) hours as needed (congestion/cough).     ferrous sulfate 325 (65 FE) MG tablet Take 325 mg by mouth daily with breakfast.     furosemide (LASIX) 40 MG tablet Take 80 mg by mouth 2 (two) times daily at 8am and 2pm.     isosorbide dinitrate (ISORDIL) 30 MG tablet TAKE 1 TABLET BY MOUTH THREE TIMES A DAY (Patient taking differently: Take 30 mg by mouth 2 (two) times daily.) 270 tablet 1   metoprolol succinate (TOPROL-XL) 50 MG 24 hr tablet TAKE 3 TABLETS BY MOUTH EVERY DAY (Patient taking differently: Take 50 mg by mouth daily.) 90 tablet 1   multivitamin (RENA-VIT) TABS tablet Take 1 tablet by mouth at bedtime.  0   nitroGLYCERIN (NITROSTAT) 0.4 MG SL tablet Place 0.4 mg under the tongue every 5 (five) minutes as needed for chest pain.     oxyCODONE-acetaminophen (PERCOCET) 5-325 MG tablet Take 1 tablet by mouth every 6 (six) hours as needed for severe pain. 20 tablet 0   potassium chloride (KLOR-CON) 10 MEQ tablet Take 10 mEq by mouth daily.     PREVIDENT 5000 BOOSTER PLUS 1.1 % PSTE Take 1 application by mouth every evening.     sertraline (ZOLOFT) 25 MG tablet TAKE 1 TABLET ONE TIME A DAY (Patient taking differently: Take 25 mg by mouth daily.) 90 tablet 1   silodosin (RAPAFLO) 8 MG CAPS capsule TAKE 1 CAPSULE BY MOUTH EVERYDAY AT BEDTIME (Patient taking differently: Take 8 mg by mouth at bedtime.) 90 capsule 3   Vitamin D, Ergocalciferol, (DRISDOL) 1.25 MG (50000 UNIT) CAPS capsule Take 1 capsule (50,000 Units total) by mouth every 7 (seven) days. (Patient taking differently: Take 50,000 Units by  mouth every Wednesday.) 12 capsule 3   hydrALAZINE (APRESOLINE) 25 MG tablet Take 2 tablets (50 mg total) by mouth 3 (three) times daily. 270 tablet 3   No facility-administered medications prior to visit.   Final Medications at End of Visit    Current Meds  Medication Sig   albuterol (VENTOLIN HFA) 108 (90 Base) MCG/ACT inhaler Inhale 2 puffs into the lungs every 6 (six) hours as needed for wheezing or shortness of breath.  aspirin EC 81 MG tablet Take 81 mg by mouth daily.   atorvastatin (LIPITOR) 20 MG tablet Take 1 tablet (20 mg total) by mouth every evening.   clonazePAM (KLONOPIN) 0.5 MG disintegrating tablet TAKE 1 TABLET BY MOUTH TWICE A DAY (Patient taking differently: Take 0.5 mg by mouth 2 (two) times daily.)   clopidogrel (PLAVIX) 75 MG tablet TAKE 1 TABLET BY MOUTH EVERY DAY (Patient taking differently: Take 75 mg by mouth daily.)   cromolyn (OPTICROM) 4 % ophthalmic solution Place 1 drop into both eyes every Wednesday. At bedtime   [employer]S ACETAMINOPHEN 325 MG tablet TAKE 2 CAPS BY MOUTH EVERY 6 HOURS AS NEEDED FOR PAIN FOR TEMP 100F OR ABOVE (Patient taking differently: Take 325 mg by mouth every 6 (six) hours as needed for mild pain or headache (temp over 100).)   CVS SENNA PLUS 8.6-50 MG tablet TAKE 2 TABLETS BY MOUTH AT BEDTIME FOR CONSTIPATION (Patient taking differently: Take 1 tablet by mouth at bedtime.)   DM-APAP-CPM (CORICIDIN HBP PO) Take 1 tablet by mouth every 6 (six) hours as needed (congestion/cough).   ferrous sulfate 325 (65 FE) MG tablet Take 325 mg by mouth daily with breakfast.   furosemide (LASIX) 40 MG tablet Take 80 mg by mouth 2 (two) times daily at 8am and 2pm.   isosorbide dinitrate (ISORDIL) 30 MG tablet TAKE 1 TABLET BY MOUTH THREE TIMES A DAY (Patient taking differently: Take 30 mg by mouth 2 (two) times daily.)   metoprolol succinate (TOPROL-XL) 50 MG 24 hr tablet TAKE 3 TABLETS BY MOUTH EVERY DAY (Patient taking differently: Take 50 mg by mouth daily.)    multivitamin (RENA-VIT) TABS tablet Take 1 tablet by mouth at bedtime.   nitroGLYCERIN (NITROSTAT) 0.4 MG SL tablet Place 0.4 mg under the tongue every 5 (five) minutes as needed for chest pain.   oxyCODONE-acetaminophen (PERCOCET) 5-325 MG tablet Take 1 tablet by mouth every 6 (six) hours as needed for severe pain.   potassium chloride (KLOR-CON) 10 MEQ tablet Take 10 mEq by mouth daily.   PREVIDENT 5000 BOOSTER PLUS 1.1 % PSTE Take 1 application by mouth every evening.   sertraline (ZOLOFT) 25 MG tablet TAKE 1 TABLET ONE TIME A DAY (Patient taking differently: Take 25 mg by mouth daily.)   silodosin (RAPAFLO) 8 MG CAPS capsule TAKE 1 CAPSULE BY MOUTH EVERYDAY AT BEDTIME (Patient taking differently: Take 8 mg by mouth at bedtime.)   Vitamin D, Ergocalciferol, (DRISDOL) 1.25 MG (50000 UNIT) CAPS capsule Take 1 capsule (50,000 Units total) by mouth every 7 (seven) days. (Patient taking differently: Take 50,000 Units by mouth every Wednesday.)   [DISCONTINUED] hydrALAZINE (APRESOLINE) 25 MG tablet Take 2 tablets (50 mg total) by mouth 3 (three) times daily.    Radiology:  No results found.  Cardiac Studies:   Heart catheterization 08-31-19: LM: Normal LAD: Ostial 95% stenosis Ramus: Ostial/prox 30% stenosis LCx: Ostial 75% stenosis RCA: Prox-mid diffuse 40% calcific disease. Distal RCA 70% ISR of 3.0 by 28 mm Promus placed 02/27/2014.  Pre-CABG Dopplers including ABI and carotid duplex: No significant carotid artery stenosis.  Antegrade vertebral artery flow. ABI normal, with triphasic waveforms at the level of the ankle.  Hx of CABG 08/15/2019: LIMA TO LAD, RIMA TO PDA, SEQUENTIAL RADIAL ARTERY TO Cx-OM1.  Echocardiogram 07/22/2020: Left ventricle cavity is normal in size. Mild concentric hypertrophy of the left ventricle. Normal global wall motion. Normal LV systolic function with EF 67%. Normal diastolic filling pattern.  Trileaflet aortic valve  with  Mild aortic valve leaflet  calcification. Trace aortic valve stenosis. No regurgitation. Normal right atrial pressure.  Echocardiogram 12/12/2020:  1. Left ventricular ejection fraction, by estimation, is 45 to 50%. The  left ventricle has low normal function. Left ventricular endocardial  border not optimally defined to evaluate regional wall motion. Left  ventricular diastolic function could not be  evaluated. There is mild of the left ventricular, inferolateral wall.   2. Right ventricular systolic function is normal. The right ventricular  size is normal.   3. Left atrial size was mildly dilated.   4. The mitral valve is normal in structure. Trivial mitral valve  regurgitation.   5. The aortic valve is tricuspid. Aortic valve regurgitation is not  visualized. Mild to moderate aortic valve sclerosis/calcification is  present, without any evidence of aortic stenosis.   6. The inferior vena cava is normal in size with greater than 50%  respiratory variability, suggesting right atrial pressure of 3 mmHg.   Bilateral lower extremity DVT study 12/12/2020: No evidence of DVT   EKG:   12/18/2020: sinus rhythm with PVCs at a rate of 96 bpm.  Normal axis.  Incomplete right bundle branch block.  Poor R wave progression, cannot exclude anteroseptal infarct old.  Compared to EKG 07/11/2020, no significant change.  12/11/2020: Sinus rhythm at a rate of 88 bpm.  Left axis, incomplete right bundle branch block.  Poor R wave progression, cannot exclude anteroseptal infarct old.  Unchanged compared to EKG 07/11/2020.  Assessment     ICD-10-CM   1. Ischemic cardiomyopathy  I25.5 EKG 12-Lead    PCV MYOCARDIAL PERFUSION WO LEXISCAN    2. Chest pain of uncertain etiology  Y63.7 EKG 12-Lead    3. Hypertension with heart disease  I11.9 hydrALAZINE (APRESOLINE) 100 MG tablet      Meds ordered this encounter  Medications   hydrALAZINE (APRESOLINE) 100 MG tablet    Sig: Take 1 tablet (100 mg total) by mouth 3 (three) times  daily.    Dispense:  270 tablet    Refill:  3   Medications Discontinued During This Encounter  Medication Reason   hydrALAZINE (APRESOLINE) 25 MG tablet Reorder    Orders Placed This Encounter  Procedures   PCV MYOCARDIAL PERFUSION WO LEXISCAN    Standing Status:   Future    Standing Expiration Date:   02/17/2021   EKG 12-Lead    Recommendations:   VERNOR MONNIG  is a 75 y.o. AA male with hypertension, hyperlipidemia, DM with stage 3 CKD,  CAD with  stenting to the PDA on 02/27/2014. Patient presented with unstable angina and NSTEMI on 08/09/2019 along with a very small subsegmental pulmonary embolism and discharged on 10/18/2019 after he underwent CABG x4 on 08/15/2019.    Patient hospitalized 10/03/2020 - 10/07/2020 with acute decompensated diastolic heart failure in the setting of CKD stage IV. Patient recently hospitalized again 12/12/2020 - 12/16/2020 with pneumonia.  Echocardiogram revealed mildly reduced LVEF of 45-50% and concern for inferolateral wall motion abnormality.  Patient who presents for follow up.   Patient's dyspnea has improved since discharge, encouraged him to follow-up with PCP for further recommendations regarding management of pneumonia.  There is no clinical evidence of acute decompensated heart failure.  Patient's blood pressure is elevated, will increase hydralazine from 50 mg to 100 mg 3 times daily.  Patient will continue to monitor blood pressure on a regular basis at home and bring a written log to his next follow-up appointment.  Will not make changes to his medications otherwise at this time.  Reiterated to patient the importance of dietary and medication compliance as well as weight loss.  Given reduced LVEF and concern for inferolateral wall motion abnormalities on recent EKG during admission will obtain nuclear stress test to evaluate for underlying reversible ischemia.  Follow-up pending for 6 weeks for hypertension and results of stress  testing.   Alethia Berthold, PA-C 12/18/2020, 1:00 PM Office: [phone]

## 2020-12-22 ENCOUNTER — Other Ambulatory Visit: Payer: Self-pay | Admitting: Cardiology

## 2020-12-23 ENCOUNTER — Encounter: Payer: Self-pay | Admitting: Internal Medicine

## 2020-12-23 ENCOUNTER — Other Ambulatory Visit: Payer: Self-pay

## 2020-12-23 ENCOUNTER — Ambulatory Visit (INDEPENDENT_AMBULATORY_CARE_PROVIDER_SITE_OTHER): Payer: Medicare Other

## 2020-12-23 ENCOUNTER — Ambulatory Visit (INDEPENDENT_AMBULATORY_CARE_PROVIDER_SITE_OTHER): Payer: Medicare Other | Admitting: Internal Medicine

## 2020-12-23 VITALS — BP 134/80 | HR 102 | Temp 98.6°F | Resp 18 | Ht 67.0 in | Wt 214.4 lb

## 2020-12-23 DIAGNOSIS — R0602 Shortness of breath: Secondary | ICD-10-CM | POA: Diagnosis not present

## 2020-12-23 DIAGNOSIS — I517 Cardiomegaly: Secondary | ICD-10-CM | POA: Diagnosis not present

## 2020-12-23 DIAGNOSIS — I10 Essential (primary) hypertension: Secondary | ICD-10-CM

## 2020-12-23 DIAGNOSIS — J189 Pneumonia, unspecified organism: Secondary | ICD-10-CM

## 2020-12-23 DIAGNOSIS — R5383 Other fatigue: Secondary | ICD-10-CM

## 2020-12-23 NOTE — Patient Instructions (Signed)
We will recheck the chest x-ray today and make sure that the pneumonia is gone.

## 2020-12-23 NOTE — Progress Notes (Signed)
   Subjective:   Patient ID: Christian [name], male    DOB: Dec 18, 1945, 75 y.o.   MRN: [id number]  HPI The patient is a 75 YO man coming in for hospital follow up (admitted with fluid in lungs and possible CAP, treated with full course in hospital, doing slightly better on discharge). He is stable since getting home. Wife states he is not moving around a lot. His appetite is normal and eating and drinking about usual. Denies missing medications. Some SOB still but improving overall. 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   Review of Systems  Constitutional:  Positive for activity change and fatigue. Negative for appetite change.  HENT: Negative.    Eyes: Negative.   Respiratory:  Positive for shortness of breath. Negative for cough and chest tightness.   Cardiovascular:  Negative for chest pain, palpitations and leg swelling.  Gastrointestinal:  Negative for abdominal distention, abdominal pain, constipation, diarrhea, nausea and vomiting.  Musculoskeletal: Negative.   Skin: Negative.   Neurological: Negative.   Psychiatric/Behavioral: Negative.     Objective:  Physical Exam Constitutional:      Appearance: He is well-developed. He is obese.     Comments: chronically ill appearing  HENT:     Head: Normocephalic and atraumatic.  Cardiovascular:     Rate and Rhythm: Normal rate and regular rhythm.  Pulmonary:     Effort: Pulmonary effort is normal. No respiratory distress.     Breath sounds: Rales present. No wheezing.     Comments: Bilateral bases Abdominal:     General: Bowel sounds are normal. There is no distension.     Palpations: Abdomen is soft.     Tenderness: There is no abdominal tenderness. There is no rebound.  Musculoskeletal:     Cervical back: Normal range of motion.  Skin:    General: Skin is warm and dry.  Neurological:     Mental Status: He is alert and oriented to person, place, and time.     Coordination: Coordination normal.    Vitals:   12/23/20  1513  BP: 134/80  Pulse: (!) 102  Resp: 18  Temp: 98.6 F (37 C)  TempSrc: Oral  SpO2: 94%  Weight: 214 lb 6.4 oz (97.3 kg)  Height: 5\' 7"  (1.702 m)    This visit occurred during the SARS-CoV-2 public health emergency.  Safety protocols were in place, including screening questions prior to the visit, additional usage of staff PPE, and extensive cleaning of exam room while observing appropriate contact time as indicated for disinfecting solutions.   Assessment & Plan:

## 2020-12-24 DIAGNOSIS — I252 Old myocardial infarction: Secondary | ICD-10-CM | POA: Diagnosis not present

## 2020-12-24 DIAGNOSIS — Z7982 Long term (current) use of aspirin: Secondary | ICD-10-CM | POA: Diagnosis not present

## 2020-12-24 DIAGNOSIS — Z7902 Long term (current) use of antithrombotics/antiplatelets: Secondary | ICD-10-CM | POA: Diagnosis not present

## 2020-12-24 DIAGNOSIS — D631 Anemia in chronic kidney disease: Secondary | ICD-10-CM | POA: Diagnosis not present

## 2020-12-24 DIAGNOSIS — E1122 Type 2 diabetes mellitus with diabetic chronic kidney disease: Secondary | ICD-10-CM | POA: Diagnosis not present

## 2020-12-24 DIAGNOSIS — I130 Hypertensive heart and chronic kidney disease with heart failure and stage 1 through stage 4 chronic kidney disease, or unspecified chronic kidney disease: Secondary | ICD-10-CM | POA: Diagnosis not present

## 2020-12-24 DIAGNOSIS — I700 Atherosclerosis of aorta: Secondary | ICD-10-CM | POA: Diagnosis not present

## 2020-12-24 DIAGNOSIS — I509 Heart failure, unspecified: Secondary | ICD-10-CM | POA: Diagnosis not present

## 2020-12-24 DIAGNOSIS — N184 Chronic kidney disease, stage 4 (severe): Secondary | ICD-10-CM | POA: Diagnosis not present

## 2020-12-24 DIAGNOSIS — Z951 Presence of aortocoronary bypass graft: Secondary | ICD-10-CM | POA: Diagnosis not present

## 2020-12-24 DIAGNOSIS — N2581 Secondary hyperparathyroidism of renal origin: Secondary | ICD-10-CM | POA: Diagnosis not present

## 2020-12-24 DIAGNOSIS — G8194 Hemiplegia, unspecified affecting left nondominant side: Secondary | ICD-10-CM | POA: Diagnosis not present

## 2020-12-24 DIAGNOSIS — E785 Hyperlipidemia, unspecified: Secondary | ICD-10-CM | POA: Diagnosis not present

## 2020-12-24 DIAGNOSIS — G35 Multiple sclerosis: Secondary | ICD-10-CM | POA: Diagnosis not present

## 2020-12-24 DIAGNOSIS — I255 Ischemic cardiomyopathy: Secondary | ICD-10-CM | POA: Diagnosis not present

## 2020-12-24 DIAGNOSIS — E1142 Type 2 diabetes mellitus with diabetic polyneuropathy: Secondary | ICD-10-CM | POA: Diagnosis not present

## 2020-12-24 DIAGNOSIS — Z86711 Personal history of pulmonary embolism: Secondary | ICD-10-CM | POA: Diagnosis not present

## 2020-12-24 DIAGNOSIS — I2510 Atherosclerotic heart disease of native coronary artery without angina pectoris: Secondary | ICD-10-CM | POA: Diagnosis not present

## 2020-12-24 DIAGNOSIS — E1169 Type 2 diabetes mellitus with other specified complication: Secondary | ICD-10-CM | POA: Diagnosis not present

## 2020-12-24 NOTE — Assessment & Plan Note (Signed)
Labs not needed today but repeat CXR indicated as he is still having some SOB. Had some chronic appearing bilateral base effusions and possible CAP (seen best on CT scan).

## 2020-12-24 NOTE — Assessment & Plan Note (Signed)
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 He is having stress test through cardiology in near future to assess for the change in his echo recently.

## 2020-12-24 NOTE — Assessment & Plan Note (Signed)
Cardiology recently adjusted his medications which they have made that change. He is taking hydralazine 100 mg TID and lasix 80 mg BID and imdur 30 mg TID and metoprolol 50 mg daily and BP at goal today.

## 2020-12-26 ENCOUNTER — Telehealth: Payer: Self-pay | Admitting: Internal Medicine

## 2020-12-26 NOTE — Telephone Encounter (Signed)
[employer]. LDVM with verbal orders for the patient to start PT. Office number was provided in case she additional questions or concerns.

## 2020-12-26 NOTE — Telephone Encounter (Signed)
Okay for verbal 

## 2020-12-26 NOTE — Telephone Encounter (Signed)
See below

## 2020-12-26 NOTE — Telephone Encounter (Signed)
   [place] Name: [facility] Agency Name: [place] Phone #: [phone] Service Requested: PT Frequency of Visits: 2W2, [phone]

## 2021-01-03 ENCOUNTER — Other Ambulatory Visit: Payer: Self-pay | Admitting: Internal Medicine

## 2021-01-15 ENCOUNTER — Ambulatory Visit: Payer: Federal, State, Local not specified - PPO

## 2021-01-15 ENCOUNTER — Other Ambulatory Visit: Payer: Self-pay

## 2021-01-20 ENCOUNTER — Ambulatory Visit: Payer: Federal, State, Local not specified - PPO

## 2021-01-20 ENCOUNTER — Other Ambulatory Visit: Payer: Self-pay

## 2021-01-20 LAB — PCV MYOCARDIAL PERFUSION WO LEXISCAN: ST Depression (mm): 0 mm

## 2021-01-21 NOTE — Progress Notes (Signed)
Attempted to call pt, no asnwer. Left vm requesting call back.

## 2021-01-21 NOTE — Progress Notes (Signed)
Called patient, NA, LMAM

## 2021-01-21 NOTE — Progress Notes (Signed)
Low risk nuclear stress test.  We will discuss further at upcoming office visit.

## 2021-01-22 NOTE — Progress Notes (Signed)
.  Called and spoke with patient regarding his stress test results.

## 2021-01-27 ENCOUNTER — Other Ambulatory Visit: Payer: Self-pay | Admitting: Cardiology

## 2021-01-28 NOTE — Progress Notes (Signed)
Primary Physician/Referring:  Hoyt Koch, MD  Patient ID: Christian [name], male    DOB: [birth year], 75 y.o.   MRN: [id number]  Chief Complaint  Patient presents with   Hypertension   Results   Follow-up   HPI:    RAYMOUND [name]  is a 75 y.o. AA male with hypertension, hyperlipidemia, DM with stage 3 CKD,  CAD with  stenting to the PDA on 02/27/2014. Patient presented with unstable angina and NSTEMI on 08/09/2019 along with a very small subsegmental pulmonary embolism and discharged on 10/18/2019 after he underwent CABG x4 on 08/15/2019.   He had extremely complex postop complications including cardiogenic shock, cardiac arrest, needing internal CPR in the ICU after opening the chest, A. Fib needing cardioversion, closure office chest wall, developed acute renal failure leading to need for permanent dialysis, right brain infarct with left hemiparesis, multiple blood transfusions.  He was also placed on dialysis however is now off of dialysis since 02/01/2020.   Patient hospitalized 10/03/2020 - 10/07/2020 with acute decompensated diastolic heart failure in the setting of CKD stage IV.  Patient recently hospitalized again 12/12/2020 - 12/16/2020 with pneumonia.  Echocardiogram revealed mildly reduced LVEF of 45-50% and concern for inferolateral wall motion abnormality.    Patient presents for 6-week follow-up of hypertension and results of stress testing.  Last office visit increased hydralazine from 50 mg  to 100 mg 3 times daily given uncontrolled hypertension.  Patient also subsequently underwent overall low risk nuclear stress test.  Patient's blood pressure is not well controlled.  He is presently taking Lasix 120 mg twice daily per nephrology.  He continues to follow with nephrology closely.  He is not presently on dialysis, however he does have a fistula in his left arm if needed.  Patient states he continues to use his albuterol inhaler as needed for wheezing and states he continues to  have episodes of shortness of breath since hospitalization.  Denies chest pain, palpitations, syncope, near syncope.  Denies orthopnea, PND.  He does have chronic bilateral lower leg swelling which is stable.  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  LIMA TO LAD, RIMA TO PDA, RADIAL ARTERY TO CIRC AND SEQUENTIALLY TO OM1. 08/15/2019   Hyperlipidemia    Hypertension    Incomplete bladder emptying    Myocardial infarction ([place]) 08/09/2019   Nocturia    PE (pulmonary thromboembolism) ([place]) 08/09/2019   small RLL PE 08/09/19   Pneumonia    as a child   Pre-diabetes    Problems with swallowing pt states test at [facility] approx 2012 shows a gastric valve  dysfunction--  eats small bites and drink liquids slowly   SOB (shortness of breath) on exertion    Past Surgical History:  Procedure Laterality Date   APPLICATION OF WOUND VAC N/A 08/24/2019   Procedure: APPLICATION OF WOUND VAC;  Surgeon: [name], MD;  Location: [place];  Service: Thoracic;  Laterality: N/A;   APPLICATION OF WOUND VAC  08/29/2019   Procedure: Wound Vac change;  Surgeon: [name], MD;  Location: [facility];  Service: Open Heart Surgery;;   APPLICATION OF  WOUND VAC N/A 09/04/2019   Procedure: Application Of Wound Vac;  Surgeon: Grace Ronelle, MD;  Location: [place];  Service: Open Heart Surgery;  Laterality: N/A;   APPLICATION OF WOUND VAC N/A 09/06/2019   Procedure: APPLICATION OF ACELL, APPLICATION OF WOUND VAC USING PREVENA INCISIONAL  DRESSING;  Surgeon: Wallace Going, DO;  Location: [place];  Service: Plastics;  Laterality: N/A;   AV FISTULA PLACEMENT Left 09/18/2020   Procedure: ARTERIOVENOUS (AV) FISTULA CREATION LEFT VERSUS GRAFT;  Surgeon:  Serafina Mitchell, MD;  Location: [place];  Service: Vascular;  Laterality: Left;   [place] Left 10/31/2020   Procedure: LEFT SECOND STAGE [place];  Surgeon: Serafina Mitchell, MD;  Location[facility];  Service: Vascular;  Laterality: Left;   CARDIAC CATHETERIZATION     CORONARY ARTERY BYPASS GRAFT N/A 08/15/2019   Procedure: CORONARY ARTERY BYPASS GRAFTING (CABG), x 4 using bilateral IMAs and left radial artery .  LIMA TO LAD, RIMA TO PDA, RADIAL ARTERY TO CIRC AND SEQUENTIALLY TO OM1.;  Surgeon: [name], MD;  Location: [place];  Service: Open Heart Surgery;  Laterality: N/A;   CORONARY STENT PLACEMENT  02/27/2014   distal rt/pd coronary       dr Christian [name]   CYSTO/ BLADDER BIOPSY'S/ CAUTHERIZATION  01-14-2004  DR Gaynelle Arabian   EXPLORATION POST OPERATIVE OPEN HEART N/A 08/16/2019   Procedure: Chest Closure S?P CABG WITH APPLICATION OF PREVENA  INCISIONAL WOUND VAC;  Surgeon: [name], MD;  Location: [place];  Service: Open Heart Surgery;  Laterality: N/A;   EXPLORATION POST OPERATIVE OPEN HEART N/A 08/21/2019   Procedure: CHEST WASHOUT S/P OPEN CHEST;  Surgeon: [name], MD;  Location: [place];  Service: Open Heart Surgery;  Laterality: N/A;  Open chest with Esmark dressing with Ioban sealant coverage.   EXPLORATION POST OPERATIVE OPEN HEART N/A 08/18/2019   Procedure: EXPLORATION POST OPERATIVE OPEN HEART (performed 04/23 on unit);  Surgeon: [name], MD;  Location: [place];  Service: Open Heart Surgery;  Laterality: N/A;   EXPLORATION POST OPERATIVE OPEN HEART N/A 08/24/2019   Procedure: CHEST WASHOUT POST OPERATIVE OPEN HEART;  Surgeon: [name], MD;  Location: [place];  Service: Open Heart Surgery;  Laterality: N/A;   EXPLORATION POST OPERATIVE OPEN HEART N/A 08/29/2019   Procedure: CHEST WOUND WASHOUT POST OPERATIVE OPEN HEART;  Surgeon: [name], MD;  Location: [place];  Service: Open Heart Surgery;  Laterality: N/A;   EXPLORATION  POST OPERATIVE OPEN HEART N/A 09/04/2019   Procedure: MEDIASTINAL EXPLORATION WITH STERNAL WOUND IRRIGATION;  Surgeon: Grace Noel, MD;  Location: [place];  Service: Open Heart Surgery;  Laterality: N/A;   EXPLORATION POST OPERATIVE OPEN HEART N/A 09/14/2019   Procedure: EVACUATION OF HEMATOMA;  Surgeon: Grace Ashante, MD;  Location: [place];  Service: Open Heart Surgery;  Laterality: N/A;   IR FLUORO GUIDE CV LINE LEFT  09/19/2019   IR GASTROSTOMY TUBE MOD SED  10/04/2019   IR GASTROSTOMY TUBE REMOVAL  11/29/2019   IR PATIENT EVAL TECH 0-60 MINS  09/29/2019   IR US GUIDE VASC ACCESS LEFT  09/19/2019   LAPAROSCOPIC LYSIS OF ADHESIONS N/A 09/06/2019   Procedure: LAPAROSCOPIC OMENTAL HARVEST;  Surgeon: Mickeal Skinner, MD;  Location: [place];  Service: General;  Laterality: N/A;   LEFT HEART CATH AND CORONARY ANGIOGRAPHY N/A 08/10/2019   Procedure: LEFT HEART CATH AND CORONARY ANGIOGRAPHY;  Surgeon: Nigel Mormon,  MD;  Location: [place] [facility];  Service: Cardiovascular;  Laterality: N/A;   LEFT HEART CATHETERIZATION WITH CORONARY ANGIOGRAM N/A 02/27/2014   Procedure: LEFT HEART CATHETERIZATION WITH CORONARY ANGIOGRAM;  Surgeon: Laverda Page, MD;  Location: [facility] CATH LAB;  Service: Cardiovascular;  Laterality: N/A;   MEDIASTINAL EXPLORATION N/A 09/06/2019   Procedure: MEDIASTINAL EXPLORATION;  Surgeon: Grace Laquinton, MD;  Location: [place];  Service: Thoracic;  Laterality: N/A;   PECTORALIS FLAP  09/06/2019   Procedure: Pectoralis ADVANCEMENT Flap;  Surgeon: Wallace Going, DO;  Location: [place];  Service: Plastics;;   PERCUTANEOUS CORONARY STENT INTERVENTION (PCI-S)  02/27/2014   Procedure: PERCUTANEOUS CORONARY STENT INTERVENTION (PCI-S);  Surgeon: Laverda Page, MD;  Location: [facility] CATH LAB;  Service: Cardiovascular;;  rt PDA  3.0/28mm Promus stent   RADIAL ARTERY HARVEST Left 08/15/2019   Procedure: Radial Artery Harvest;  Surgeon: [name], MD;  Location: [place];   Service: Open Heart Surgery;  Laterality: Left;   RIB PLATING N/A 09/06/2019   Procedure: STERNAL PLATING;  Surgeon: Grace Jayzen, MD;  Location: [place];  Service: Thoracic;  Laterality: N/A;   TEE WITHOUT CARDIOVERSION N/A 08/15/2019   Procedure: TRANSESOPHAGEAL ECHOCARDIOGRAM (TEE);  Surgeon: [name], MD;  Location: [place];  Service: Open Heart Surgery;  Laterality: N/A;   TRACHEOSTOMY TUBE PLACEMENT  08/29/2019   Procedure: Tracheostomy;  Surgeon: [name], MD;  [facility];  Service: Open Heart Surgery; Decannulated 6/10/20211   TRANSURETHRAL RESECTION OF PROSTATE  04/04/2012   Procedure: TRANSURETHRAL RESECTION OF THE PROSTATE WITH GYRUS INSTRUMENTS;  Surgeon: Ailene Rud, MD;  Location: [facility];  Service: Urology;  Laterality: N/A;   TRANSURETHRAL RESECTION OF PROSTATE N/A 09/27/2014   Procedure: TRANSURETHRAL RESECTION OF THE PROSTATE ;  Surgeon: Carolan Clines, MD;  Location: [facility];  Service: Urology;  Laterality: N/A;   UPPER GASTROINTESTINAL ENDOSCOPY     Family History  Problem Relation Age of Onset   Diabetes Mother    Hypertension Mother    Diabetes Father    Diabetes Brother    Hypertension Brother    Bone cancer Brother    Diabetes Brother    Social History   Tobacco Use   Smoking status: Former    Packs/day: 0.50    Years: 20.00    Pack years: 10.00    Types: Cigarettes    Quit date: 1975    Years since quitting: 47.7   Smokeless tobacco: Never  Substance Use Topics   Alcohol use: Not Currently    Comment: very rarely - every now and then 1 drink/1 year if that   ROS   Review of Systems  Constitutional: Negative for malaise/fatigue and weight gain.  Cardiovascular:  Negative for chest pain, claudication, leg swelling, near-syncope, orthopnea, palpitations, paroxysmal nocturnal dyspnea and syncope.  Respiratory:  Positive for shortness of breath.   Musculoskeletal:  Positive for joint pain.   Gastrointestinal:  Negative for melena.  Neurological:  Positive for disturbances in coordination (left sided weakness) and loss of balance. Negative for dizziness.  Objective  Blood pressure 129/70, pulse 89, temperature 97.8 F (36.6 C), temperature source Temporal, height 5\' 7"  (1.702 m), weight 212 lb (96.2 kg), SpO2 96 %.  Vitals with BMI 01/29/2021 01/29/2021 12/23/2020  Height - 5\' 7"  5\' 7"   Weight - 212 lbs 214 lbs 6 oz  BMI - 54.6 50.35  Systolic 465 681 275  Diastolic 70 75 80  Pulse 89 86 102    Physical Exam Vitals reviewed.  Constitutional:      Comments: He is moderately built and mildly obese in no acute distress.  HENT:     Head: Normocephalic and atraumatic.  Neck:     Thyroid: No thyromegaly.  Cardiovascular:     Rate and Rhythm: Normal rate and regular rhythm.     Pulses:          Carotid pulses are 2+ on the right side and 2+ on the left side.      Femoral pulses are 2+ on the right side and 2+ on the left side.      Popliteal pulses are 0 on the right side and 0 on the left side.       Dorsalis pedis pulses are 0 on the right side and 0 on the left side.       Posterior tibial pulses are 0 on the right side and 0 on the left side.     Heart sounds: Normal heart sounds, S1 normal and S2 normal. No murmur heard.   No gallop.     Comments: No JVD Pulmonary:     Effort: No respiratory distress.     Breath sounds: Wheezing (occasional) present. No rhonchi or rales.     Comments: Mildly dyspneic while talking during exam Chest:     Comments: Sternotomy scar has healed well Musculoskeletal:     Right lower leg: Edema (minimal) present.     Left lower leg: Edema (minimal) present.  Neurological:     Mental Status: He is alert.    Laboratory examination:   Recent Labs    12/14/20 0124 12/15/20 0045 12/16/20 0022  NA 135 138 139  K 3.9 3.6 3.9  CL 103 106 105  CO2 23 25 23   GLUCOSE 98 204* 127*  BUN 43* 40* 38*  CREATININE 2.69* 2.76* 2.52*  CALCIUM  8.4* 8.6* 8.9  GFRNONAA 24* 24* 26*   CrCl cannot be calculated (Patient's most recent lab result is older than the maximum 21 days allowed.).  CMP Latest Ref Rng & Units 12/16/2020 12/15/2020 12/14/2020  Glucose 70 - 99 mg/dL 127(H) 204(H) 98  BUN 8 - 23 mg/dL 38(H) 40(H) 43(H)  Creatinine 0.61 - 1.24 mg/dL 2.52(H) 2.76(H) 2.69(H)  Sodium 135 - 145 mmol/L 139 138 135  Potassium 3.5 - 5.1 mmol/L 3.9 3.6 3.9  Chloride 98 - 111 mmol/L 105 106 103  CO2 22 - 32 mmol/L 23 25 23   Calcium 8.9 - 10.3 mg/dL 8.9 8.6(L) 8.4(L)  Total Protein 6.5 - 8.1 g/dL - - -  Total Bilirubin 0.3 - 1.2 mg/dL - - -  Alkaline Phos 38 - 126 U/L - - -  AST 15 - 41 U/L - - -  ALT 0 - 44 U/L - - -   CBC Latest Ref Rng & Units 12/12/2020 12/11/2020 10/31/2020  WBC 4.0 - 10.5 K/uL 8.4 9.2 -  Hemoglobin 13.0 - 17.0 g/dL 11.7(L) 10.8(L) 11.2(L)  Hematocrit 39.0 - 52.0 % 38.1(L) 35.9(L) 33.0(L)  Platelets 150 - 400 K/uL 218 203 -   Lipid Panel No results for input(s): CHOL, TRIG, LDLCALC, VLDL, HDL, CHOLHDL, LDLDIRECT in the last 8760 hours.  HEMOGLOBIN A1C Lab Results  Component Value Date   HGBA1C 5.4 03/25/2020   MPG 94 12/20/2019   TSH Recent Labs    05/28/20 1538  TSH 1.24   BNP    Component Value Date/Time   BNP 184.8 (  H) 12/11/2020 1533   ProBNP    Component Value Date/Time   PROBNP 64.0 05/28/2020 1538   Allergies  No Known Allergies   Medications Prior to Visit:   Outpatient Medications Prior to Visit  Medication Sig Dispense Refill   albuterol (VENTOLIN HFA) 108 (90 Base) MCG/ACT inhaler Inhale 2 puffs into the lungs every 6 (six) hours as needed for wheezing or shortness of breath. 8 g 2   aspirin EC 81 MG tablet Take 81 mg by mouth daily.     atorvastatin (LIPITOR) 20 MG tablet Take 1 tablet (20 mg total) by mouth every evening. 90 tablet 3   clonazePAM (KLONOPIN) 0.5 MG disintegrating tablet TAKE 1 TABLET BY MOUTH TWICE A DAY (Patient taking differently: Take 0.5 mg by mouth 2 (two)  times daily.) 60 tablet 5   clopidogrel (PLAVIX) 75 MG tablet TAKE 1 TABLET BY MOUTH EVERY DAY (Patient taking differently: Take 75 mg by mouth daily.) 90 tablet 1   cromolyn (OPTICROM) 4 % ophthalmic solution Place 1 drop into both eyes every Wednesday. At bedtime     [employer] ACETAMINOPHEN 325 MG tablet TAKE 2 CAPS BY MOUTH EVERY 6 HOURS AS NEEDED FOR PAIN FOR TEMP 100F OR ABOVE (Patient taking differently: Take 325 mg by mouth every 6 (six) hours as needed for mild pain or headache (temp over 100).) 60 tablet 0   [employer] SENNA PLUS 8.6-50 MG tablet TAKE 2 TABLETS BY MOUTH AT BEDTIME FOR CONSTIPATION (Patient taking differently: Take 1 tablet by mouth at bedtime.) 60 tablet 1   DM-APAP-CPM (CORICIDIN HBP PO) Take 1 tablet by mouth every 6 (six) hours as needed (congestion/cough).     ferrous sulfate 325 (65 FE) MG tablet Take 325 mg by mouth daily with breakfast.     furosemide (LASIX) 40 MG tablet Take 80 mg by mouth 2 (two) times daily at 8am and 2pm.     hydrALAZINE (APRESOLINE) 100 MG tablet Take 1 tablet (100 mg total) by mouth 3 (three) times daily. 270 tablet 3   isosorbide dinitrate (ISORDIL) 30 MG tablet TAKE 1 TABLET BY MOUTH THREE TIMES A DAY (Patient taking differently: Take 30 mg by mouth 2 (two) times daily.) 270 tablet 1   metoprolol succinate (TOPROL-XL) 50 MG 24 hr tablet TAKE 3 TABLETS BY MOUTH EVERY DAY 90 tablet 1   multivitamin (RENA-VIT) TABS tablet Take 1 tablet by mouth at bedtime.  0   nitroGLYCERIN (NITROSTAT) 0.4 MG SL tablet Place 0.4 mg under the tongue every 5 (five) minutes as needed for chest pain.     oxyCODONE-acetaminophen (PERCOCET) 5-325 MG tablet Take 1 tablet by mouth every 6 (six) hours as needed for severe pain. 20 tablet 0   potassium chloride (KLOR-CON) 10 MEQ tablet Take 10 mEq by mouth daily.     PREVIDENT 5000 BOOSTER PLUS 1.1 % PSTE Take 1 application by mouth every evening.     sertraline (ZOLOFT) 25 MG tablet TAKE 1 TABLET ONE TIME A DAY 90 tablet 1    silodosin (RAPAFLO) 8 MG CAPS capsule TAKE 1 CAPSULE BY MOUTH EVERYDAY AT BEDTIME (Patient taking differently: Take 8 mg by mouth at bedtime.) 90 capsule 3   Vitamin D, Ergocalciferol, (DRISDOL) 1.25 MG (50000 UNIT) CAPS capsule Take 1 capsule (50,000 Units total) by mouth every 7 (seven) days. (Patient taking differently: Take 50,000 Units by mouth every Wednesday.) 12 capsule 3   No facility-administered medications prior to visit.   Final Medications at End of Visit  Current Meds  Medication Sig   albuterol (VENTOLIN HFA) 108 (90 Base) MCG/ACT inhaler Inhale 2 puffs into the lungs every 6 (six) hours as needed for wheezing or shortness of breath.   aspirin EC 81 MG tablet Take 81 mg by mouth daily.   atorvastatin (LIPITOR) 20 MG tablet Take 1 tablet (20 mg total) by mouth every evening.   clonazePAM (KLONOPIN) 0.5 MG disintegrating tablet TAKE 1 TABLET BY MOUTH TWICE A DAY (Patient taking differently: Take 0.5 mg by mouth 2 (two) times daily.)   clopidogrel (PLAVIX) 75 MG tablet TAKE 1 TABLET BY MOUTH EVERY DAY (Patient taking differently: Take 75 mg by mouth daily.)   cromolyn (OPTICROM) 4 % ophthalmic solution Place 1 drop into both eyes every Wednesday. At bedtime   [employer]S ACETAMINOPHEN 325 MG tablet TAKE 2 CAPS BY MOUTH EVERY 6 HOURS AS NEEDED FOR PAIN FOR TEMP 100F OR ABOVE (Patient taking differently: Take 325 mg by mouth every 6 (six) hours as needed for mild pain or headache (temp over 100).)   [employer]S SENNA PLUS 8.6-50 MG tablet TAKE 2 TABLETS BY MOUTH AT BEDTIME FOR CONSTIPATION (Patient taking differently: Take 1 tablet by mouth at bedtime.)   DM-APAP-CPM (CORICIDIN HBP PO) Take 1 tablet by mouth every 6 (six) hours as needed (congestion/cough).   ferrous sulfate 325 (65 FE) MG tablet Take 325 mg by mouth daily with breakfast.   furosemide (LASIX) 40 MG tablet Take 80 mg by mouth 2 (two) times daily at 8am and 2pm.   hydrALAZINE (APRESOLINE) 100 MG tablet Take 1 tablet (100 mg total) by  mouth 3 (three) times daily.   isosorbide dinitrate (ISORDIL) 30 MG tablet TAKE 1 TABLET BY MOUTH THREE TIMES A DAY (Patient taking differently: Take 30 mg by mouth 2 (two) times daily.)   metoprolol succinate (TOPROL-XL) 50 MG 24 hr tablet TAKE 3 TABLETS BY MOUTH EVERY DAY   multivitamin (RENA-VIT) TABS tablet Take 1 tablet by mouth at bedtime.   nitroGLYCERIN (NITROSTAT) 0.4 MG SL tablet Place 0.4 mg under the tongue every 5 (five) minutes as needed for chest pain.   oxyCODONE-acetaminophen (PERCOCET) 5-325 MG tablet Take 1 tablet by mouth every 6 (six) hours as needed for severe pain.   potassium chloride (KLOR-CON) 10 MEQ tablet Take 10 mEq by mouth daily.   PREVIDENT 5000 BOOSTER PLUS 1.1 % PSTE Take 1 application by mouth every evening.   sertraline (ZOLOFT) 25 MG tablet TAKE 1 TABLET ONE TIME A DAY   silodosin (RAPAFLO) 8 MG CAPS capsule TAKE 1 CAPSULE BY MOUTH EVERYDAY AT BEDTIME (Patient taking differently: Take 8 mg by mouth at bedtime.)   Vitamin D, Ergocalciferol, (DRISDOL) 1.25 MG (50000 UNIT) CAPS capsule Take 1 capsule (50,000 Units total) by mouth every 7 (seven) days. (Patient taking differently: Take 50,000 Units by mouth every Wednesday.)    Radiology:  No results found.  Cardiac Studies:   Heart catheterization 08-14-19: LM: Normal LAD: Ostial 95% stenosis Ramus: Ostial/prox 30% stenosis LCx: Ostial 75% stenosis RCA: Prox-mid diffuse 40% calcific disease. Distal RCA 70% ISR of 3.0 by 28 mm Promus placed 02/27/2014.  Pre-CABG Dopplers including ABI and carotid duplex: No significant carotid artery stenosis.  Antegrade vertebral artery flow. ABI normal, with triphasic waveforms at the level of the ankle.  Hx of CABG 08/15/2019: LIMA TO LAD, RIMA TO PDA, SEQUENTIAL RADIAL ARTERY TO Cx-OM1.  Echocardiogram 07/22/2020: Left ventricle cavity is normal in size. Mild concentric hypertrophy of the left ventricle. Normal global wall  motion. Normal LV systolic function with EF  67%. Normal diastolic filling pattern.  Trileaflet aortic valve with  Mild aortic valve leaflet calcification. Trace aortic valve stenosis. No regurgitation. Normal right atrial pressure.  Echocardiogram 12/12/2020:  1. Left ventricular ejection fraction, by estimation, is 45 to 50%. The  left ventricle has low normal function. Left ventricular endocardial  border not optimally defined to evaluate regional wall motion. Left  ventricular diastolic function could not be  evaluated. There is mild of the left ventricular, inferolateral wall.   2. Right ventricular systolic function is normal. The right ventricular  size is normal.   3. Left atrial size was mildly dilated.   4. The mitral valve is normal in structure. Trivial mitral valve  regurgitation.   5. The aortic valve is tricuspid. Aortic valve regurgitation is not  visualized. Mild to moderate aortic valve sclerosis/calcification is  present, without any evidence of aortic stenosis.   6. The inferior vena cava is normal in size with greater than 50%  respiratory variability, suggesting right atrial pressure of 3 mmHg.   Bilateral lower extremity DVT study 12/12/2020: No evidence of DVT  PCV MYOCARDIAL PERFUSION WO LEXISCAN 01/20/2021 Lexiscan nuclear stress test performed using 1-day protocol. Normal myocardial perfusion. Stress LVEF calculated 49%, although visually appears normal. Low risk study.  EKG:   12/18/2020: sinus rhythm with PVCs at a rate of 96 bpm.  Normal axis.  Incomplete right bundle branch block.  Poor R wave progression, cannot exclude anteroseptal infarct old.  Compared to EKG 07/11/2020, no significant change.  12/11/2020: Sinus rhythm at a rate of 88 bpm.  Left axis, incomplete right bundle branch block.  Poor R wave progression, cannot exclude anteroseptal infarct old.  Unchanged compared to EKG 07/11/2020.  Assessment     ICD-10-CM   1. Chronic combined systolic and diastolic heart failure (HCC)  I50.42      2. Ischemic cardiomyopathy  I25.5 Brain natriuretic peptide    3. Primary hypertension  I10       No orders of the defined types were placed in this encounter.  There are no discontinued medications.   Orders Placed This Encounter  Procedures   Brain natriuretic peptide    Recommendations:   KIERON KANTNER  is a 75 y.o. AA male with hypertension, hyperlipidemia, DM with stage 3 CKD,  CAD with  stenting to the PDA on 02/27/2014. Patient presented with unstable angina and NSTEMI on 08/09/2019 along with a very small subsegmental pulmonary embolism and discharged on 10/18/2019 after he underwent CABG x4 on 08/15/2019.    Patient hospitalized 10/03/2020 - 10/07/2020 with acute decompensated diastolic heart failure in the setting of CKD stage IV. Patient recently hospitalized again 12/12/2020 - 12/16/2020 with pneumonia.  Echocardiogram revealed mildly reduced LVEF of 45-50% and concern for inferolateral wall motion abnormality.    Patient presents for 6-week follow-up of hypertension and results of stress testing.  Last office visit increased hydralazine from 50 mg  to 100 mg 3 times daily given uncontrolled hypertension.  Patient also subsequently underwent overall low risk nuclear stress test.  Reviewed and discussed with patient and his wife who is present at bedside results of stress test, details above.  Stress test does not show evidence of reversible ischemia, therefore do not feel mild reduction of LVEF is related to correction of underlying CAD.  Pressure was initially elevated in the office, however upon recheck it is well controlled.  Given patient's continued episodes of shortness of breath and  that he is dyspneic while talking during exam today we will obtain BNP.  If BNP is normal would consider referral to pulmonology for further evaluation of patient's shortness of breath.  Will not make changes to his medications at this time.  Again discussed at length with patient and his wife the  importance of dietary compliance and weight loss as well as medication compliance.  Follow-up in 3 months, sooner if needed, for heart failure, CAD, hypertension, hyperlipidemia.   Alethia Berthold, PA-C 01/29/2021, 12:52 PM Office: [phone]

## 2021-01-29 ENCOUNTER — Encounter: Payer: Self-pay | Admitting: Student

## 2021-01-29 ENCOUNTER — Other Ambulatory Visit: Payer: Self-pay

## 2021-01-29 ENCOUNTER — Ambulatory Visit: Payer: Federal, State, Local not specified - PPO | Admitting: Student

## 2021-01-29 VITALS — BP 129/70 | HR 89 | Temp 97.8°F | Ht 67.0 in | Wt 212.0 lb

## 2021-01-29 DIAGNOSIS — I10 Essential (primary) hypertension: Secondary | ICD-10-CM

## 2021-01-29 DIAGNOSIS — I5031 Acute diastolic (congestive) heart failure: Secondary | ICD-10-CM

## 2021-01-29 DIAGNOSIS — I5042 Chronic combined systolic (congestive) and diastolic (congestive) heart failure: Secondary | ICD-10-CM

## 2021-01-29 DIAGNOSIS — I255 Ischemic cardiomyopathy: Secondary | ICD-10-CM

## 2021-01-30 ENCOUNTER — Ambulatory Visit (INDEPENDENT_AMBULATORY_CARE_PROVIDER_SITE_OTHER): Payer: Medicare Other | Admitting: Pharmacist

## 2021-01-30 DIAGNOSIS — Z951 Presence of aortocoronary bypass graft: Secondary | ICD-10-CM

## 2021-01-30 DIAGNOSIS — N184 Chronic kidney disease, stage 4 (severe): Secondary | ICD-10-CM

## 2021-01-30 DIAGNOSIS — I5032 Chronic diastolic (congestive) heart failure: Secondary | ICD-10-CM

## 2021-01-30 DIAGNOSIS — E1169 Type 2 diabetes mellitus with other specified complication: Secondary | ICD-10-CM

## 2021-01-30 DIAGNOSIS — I10 Essential (primary) hypertension: Secondary | ICD-10-CM

## 2021-01-30 DIAGNOSIS — E785 Hyperlipidemia, unspecified: Secondary | ICD-10-CM

## 2021-01-30 NOTE — Progress Notes (Signed)
Chronic Care Management Pharmacy Note  01/30/2021 Name:  Christian [name] MRN:  [id number] DOB:  [birth year]  Summary: -Pt's wife endorses compliance with recent medications per hospital visits and specialists - updated med list  Recommendations/Changes made from today's visit: -Advised to continue same medications -Counseled on low salt diet   Subjective: Christian [name] is an 75 y.o. year old male who is a primary patient of Hoyt Koch, MD.  The CCM team was consulted for assistance with disease management and care coordination needs.    Engaged with patient by telephone for follow up visit in response to provider referral for pharmacy case management and/or care coordination services.   Consent to Services:  The patient was given information about Chronic Care Management services, agreed to services, and gave verbal consent prior to initiation of services.  Please see initial visit note for detailed documentation.   Patient Care Team: Hoyt Koch, MD as PCP - General (Internal Medicine) Gardiner Barefoot, DPM as Consulting Physician (Podiatry) McKenzie, Candee Furbish, MD as Consulting Physician (Urology) Woody Seller Sylvan Cheese, MD as Referring Physician (Cardiology) Charlton Haws, [facility] as Pharmacist (Pharmacist)   Patient lives at home with wife who handles his medications. She sets up pill box for him and keeps track of changes on med list. Patient is currently getting help from home health nurse 3 days a week but this service will end soon per patient. He is interested in getting into [facility] for supervised physical therapy and exercise.   Recent office visits: 12/23/20 Dr Sharlet Salina OV: hospital f/u (CAP): BP at goal; CXR stable w/ fluid  06/28/20 Dr Sharlet Salina OV: chronic f/u; pt gained 30 lbs since last month w/ appetite stimulation from mirtazapine. He slept a lot better. Decided to d/c mirtazapine, may consider restarting lower dose if sleep  patterns worsen. Counseled DASH diet and low sodium diet.  05/28/20 Dr Sharlet Salina OV: f/u fatigue; rx'd mirtazapine; dc'd tamsulosin, Rapaflo, Renvela.  Recent consult visits: 01/29/21 PA Cantwell (cardiology): BP at goal; continued dyspnea; recent stress test w/o evidence of ischemia; ordered BNP given continued DOE; will consider pulmonology referral if normal  12/26/20 Dr Royce Macadamia (nephrology): titrate lasix back to 120 mg BID  12/18/20 PA Cantwell (cardiology): hospital f/u; BP elevated; increase hydralazine to 100 mg TID  10/14/20 PA Cantwell (cardiology): BP elevated (recent reduction of metoprolol to 150 mg in hospital); increase hydralazine to 50 mg TID.  09/24/20 Dr Royce Macadamia (nephrology): f/u CKD. No med changes.  09/13/20 Dr Donzetta Matters (vascular surgery): eval for dialysis access. Plan for AV fistula  08/29/20 Dr Einar Gip (cardiology): BP/HR improved with 200 mg metoprolol. D/C plavix due to 1 year on DAPT.  07/11/20 Dr Einar Gip (cardiology): increased metoprolol to 200 mg daily  05/09/20 Dr Royce Macadamia (nephrology): increased furosmide to 80 mg BID. Referred to [facility] for AVF placement. F/u with urology for incontinence issues  05/03/19 Dr Einar Gip (cardiology): continue same meds. Scheduled for cardiac rehab in March.  Hospital visits: Medication Reconciliation was completed by comparing discharge summary, patient's EMR and Pharmacy list, and upon discussion with patient.  Admitted to the hospital on 12/11/20 due to CAP, Pleural effusion. Discharge date was 12/16/20. Discharged from [facility].   -Completed ceftriaxone and azithro x 5 days -rec'd OP stress test  Medication Changes at Hospital Discharge: -Changed furosemide to 80 mg BID  Medications that remain the same after Hospital Discharge:??  -All other medications will remain the same.    10/31/20 admission for  vein transposition.  Admitted to the hospital on 10/03/20 due to CHF exacerbation Discharge date was 10/07/20. Discharged from [facility]?Medications Started at [facility] Discharge:?? -started potassium 40 meq x 15 days  -clopidogrel 75 mg (restarted)  Medication Changes at Hospital Discharge: -Changed furosemide to 120 mg BID for diuresis -Reduced metoprolol to 150 mg given volume overload  Medications Discontinued at Hospital Discharge: -Stopped oxycodone  Medications that remain the same after Hospital Discharge:??  -All other medications will remain the same.    09/18/20 admission to [facility] for AV fistula.  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  Objective:  Lab Results  Component Value Date   CREATININE 2.52 (H) 12/16/2020   BUN 38 (H) 12/16/2020   GFR 17.66 (L) 05/28/2020   GFRNONAA 26 (L) 12/16/2020   GFRAA 19 (L) 10/18/2019   NA 139 12/16/2020   K 3.9 12/16/2020   CALCIUM 8.9 12/16/2020   CO2 23 12/16/2020   GLUCOSE 127 (H) 12/16/2020    Lab Results  Component Value Date/Time   HGBA1C 5.4 03/25/2020 11:55 AM   HGBA1C 4.9 12/20/2019 01:12 PM   GFR 17.66 (L) 05/28/2020 03:38 PM   GFR 18.69 (L) 03/25/2020 11:55 AM    Last diabetic Eye exam:  Lab Results  Component Value Date/Time   HMDIABEYEEXA No Retinopathy 05/30/2020 10:25 AM    Last diabetic Foot exam: No results found for: HMDIABFOOTEX   Lab Results  Component Value Date   CHOL 107 08/11/2019   HDL 42 08/11/2019   LDLCALC 40 08/11/2019   LDLDIRECT 40.0 11/23/2018   TRIG 71 08/27/2019   CHOLHDL 2.5 08/11/2019    Hepatic Function Latest Ref Rng & Units 10/04/2020 05/28/2020 03/25/2020  Total Protein 6.5 - 8.1 g/dL 7.0 8.0 -  Albumin 3.5 - 5.0 g/dL 3.8 4.1 4.1  AST 15 - 41 U/L 32 32 -  ALT 0 - 44 U/L 52(H) 28 -  Alk Phosphatase 38 - 126 U/L 80 80 -  Total Bilirubin 0.3 - 1.2 mg/dL 0.5 0.3 -    Lab Results  Component Value Date/Time   TSH 1.24 05/28/2020 03:38 PM   TSH 2.590  04/19/2019 09:11 AM    CBC Latest Ref Rng & Units 12/12/2020 12/11/2020 10/31/2020  WBC 4.0 - 10.5 K/uL 8.4 9.2 -  Hemoglobin 13.0 - 17.0 g/dL 11.7(L) 10.8(L) 11.2(L)  Hematocrit 39.0 - 52.0 % 38.1(L) 35.9(L) 33.0(L)  Platelets 150 - 400 K/uL 218 203 -    Lab Results  Component Value Date/Time   VD25OH 58.52 05/28/2020 03:38 PM   VD25OH 95.53 10/19/2017 11:10 AM    Clinical ASCVD: Yes  The ASCVD Risk score (Arnett DK, et al., 2019) failed to calculate for the following reasons:   The patient has a prior MI or stroke diagnosis    Depression screen [facility] 2/9 09/12/2020 09/12/2020 06/25/2020  Decreased Interest 0 0 0  Down, Depressed, Hopeless 0 0 0  PHQ - 2 Score 0 0 0  Some recent data might be hidden     Social History   Tobacco Use  Smoking Status Former   Packs/day: 0.50   Years: 20.00   Pack years: 10.00   Types: Cigarettes   Quit date: 1975   Years since quitting: 47.7  Smokeless Tobacco Never   BP Readings from Last 3 Encounters:  01/29/21 129/70  12/23/20  134/80  12/18/20 (!) 144/70   Pulse Readings from Last 3 Encounters:  01/29/21 89  12/23/20 (!) 102  12/18/20 91   Wt Readings from Last 3 Encounters:  01/29/21 212 lb (96.2 kg)  12/23/20 214 lb 6.4 oz (97.3 kg)  12/18/20 208 lb (94.3 kg)   BMI Readings from Last 3 Encounters:  01/29/21 33.20 kg/m  12/23/20 33.58 kg/m  12/18/20 32.58 kg/m    Assessment/Interventions: Review of patient past medical history, allergies, medications, health status, including review of consultants reports, laboratory and other test data, was performed as part of comprehensive evaluation and provision of chronic care management services.   SDOH:  (Social Determinants of Health) assessments and interventions performed: Yes  SDOH Screenings   Alcohol Screen: Not on file  Depression (PHQ2-9): Low Risk    PHQ-2 Score: 0  Financial Resource Strain: Low Risk    Difficulty of Paying Living Expenses: Not very hard  Food  Insecurity: Not on file  Housing: Not on file  Physical Activity: Not on file  Social Connections: Not on file  Stress: Not on file  Tobacco Use: Medium Risk   Smoking Tobacco Use: Former   Smokeless Tobacco Use: Never  Transportation Needs: Not on file    [place]  No Known Allergies  Medications Reviewed Today     Reviewed by Verneda Skill (Physician Assistant Certified) on 23/55/73 at [place] List Status: <None>   Medication Order Taking? Sig Documenting Provider Last Dose Status Informant  albuterol (VENTOLIN HFA) 108 (90 Base) MCG/ACT inhaler [id number] Yes Inhale 2 puffs into the lungs every 6 (six) hours as needed for wheezing or shortness of breath. Shawna Clamp, MD Taking Active   aspirin EC 81 MG tablet [id number] Yes Take 81 mg by mouth daily. [provider] Taking Active Multiple Informants  atorvastatin (LIPITOR) 20 MG tablet [id number] Yes Take 1 tablet (20 mg total) by mouth every evening. Adrian Prows, MD Taking Active Multiple Informants  clonazePAM (KLONOPIN) 0.5 MG disintegrating tablet [id number] Yes TAKE 1 TABLET BY MOUTH TWICE A DAY  Patient taking differently: Take 0.5 mg by mouth 2 (two) times daily.   Hoyt Koch, MD Taking Active   clopidogrel (PLAVIX) 75 MG tablet [id number] Yes TAKE 1 TABLET BY MOUTH EVERY DAY  Patient taking differently: Take 75 mg by mouth daily.   Adrian Prows, MD Taking Active   cromolyn (OPTICROM) 4 % ophthalmic solution [id number] Yes Place 1 drop into both eyes every Wednesday. At bedtime [provider] Taking Active Multiple Informants  [facility] ACETAMINOPHEN 325 MG tablet [id number] Yes TAKE 2 CAPS BY MOUTH EVERY 6 HOURS AS NEEDED FOR PAIN FOR TEMP 100F OR ABOVE  Patient taking differently: Take 325 mg by mouth every 6 (six) hours as needed for mild pain or headache (temp over 100).   Hoyt Koch, MD Taking Active   [facility] SENNA PLUS 8.6-50 MG tablet [id number] Yes TAKE 2 TABLETS BY MOUTH  AT BEDTIME FOR CONSTIPATION  Patient taking differently: Take 1 tablet by mouth at bedtime.   Hoyt Koch, MD Taking Active   DM-APAP-CPM (CORICIDIN HBP PO) [id number] Yes Take 1 tablet by mouth every 6 (six) hours as needed (congestion/cough). [provider] Taking Active Multiple Informants  ferrous sulfate 325 (65 FE) MG tablet [id number] Yes Take 325 mg by mouth daily with breakfast. [provider] Taking Active Multiple Informants           Med Note (CRUZ, RICARDO  K   Wed Apr 24, 2020  2:09 PM)    furosemide (LASIX) 40 MG tablet [id number] Yes Take 80 mg by mouth 2 (two) times daily at 8am and 2pm. [provider] Taking Active Multiple Informants  hydrALAZINE (APRESOLINE) 100 MG tablet [id number] Yes Take 1 tablet (100 mg total) by mouth 3 (three) times daily. Cantwell, Celeste C, PA-C Taking Active   isosorbide dinitrate (ISORDIL) 30 MG tablet [id number] Yes TAKE 1 TABLET BY MOUTH THREE TIMES A DAY  Patient taking differently: Take 30 mg by mouth 2 (two) times daily.   Cantwell, Celeste C, PA-C Taking Active   metoprolol succinate (TOPROL-XL) 50 MG 24 hr tablet [id number] Yes TAKE 3 TABLETS BY MOUTH EVERY DAY Cantwell, Celeste C, PA-C Taking Active   multivitamin (RENA-VIT) TABS tablet [id number] Yes Take 1 tablet by mouth at bedtime. Barrett, Lodema Hong, PA-C Taking Active Multiple Informants  nitroGLYCERIN (NITROSTAT) 0.4 MG SL tablet [id number] Yes Place 0.4 mg under the tongue every 5 (five) minutes as needed for chest pain. [provider] Taking Active Multiple Informants  oxyCODONE-acetaminophen (PERCOCET) 5-325 MG tablet [id number] Yes Take 1 tablet by mouth every 6 (six) hours as needed for severe pain. Ulyses Amor, PA-C Taking Active Multiple Informants  potassium chloride (KLOR-CON) 10 MEQ tablet [id number] Yes Take 10 mEq by mouth daily. [provider] Taking Active Multiple Informants  PREVIDENT 5000 BOOSTER PLUS 1.1 % PSTE  [id number] Yes Take 1 application by mouth every evening. [provider] Taking Active Multiple Informants  sertraline (ZOLOFT) 25 MG tablet [id number] Yes TAKE 1 TABLET ONE TIME A DAY Hoyt Koch, MD Taking Active   silodosin (RAPAFLO) 8 MG CAPS capsule [id number] Yes TAKE 1 CAPSULE BY MOUTH EVERYDAY AT BEDTIME  Patient taking differently: Take 8 mg by mouth at bedtime.   McKenzie, Candee Furbish, MD Taking Active   Vitamin D, Ergocalciferol, (DRISDOL) 1.25 MG (50000 UNIT) CAPS capsule [id number] Yes Take 1 capsule (50,000 Units total) by mouth every 7 (seven) days.  Patient taking differently: Take 50,000 Units by mouth every Wednesday.   Hoyt Koch, MD Taking Active             Patient Active Problem List   Diagnosis Date Noted   Atypical pneumonia 12/12/2020   Pleural effusion 12/12/2020   Fluid overload 12/12/2020   Pulmonary edema 10/03/2020   Sleep difficulties 05/30/2020   Poor appetite 05/30/2020   Anemia due to blood loss 04/24/2020   CKD (chronic kidney disease) stage 4, GFR 15-29 ml/min (HCC) 03/27/2020   Aortic atherosclerosis ([place]) 03/27/2020   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   Pruritus, unspecified 11/28/2019   Unspecified protein-calorie malnutrition ([place]) 10/27/2019   Secondary hyperparathyroidism of renal origin ([place]) 10/17/2019   Coronary artery disease 08/15/2019   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  LIMA TO LAD, RIMA TO PDA, RADIAL ARTERY TO CIRC AND SEQUENTIALLY TO OM1. 08/15/2019   Ischemic cardiomyopathy    Swelling of lower extremity    History of coronary artery stent placement    Pulmonary embolism ([place]) 08/09/2019   Elevated liver enzymes 08/09/2019   Left leg weakness 11/23/2018   CAD (coronary artery disease) 07/30/2018   Other fatigue 10/19/2017   Routine general medical examination at a health care facility 08/30/2016   Diabetes mellitus with polyneuropathy ([place]) 10/10/2014    Benign prostatic hyperplasia 09/27/2014   Stable angina pectoris ([place]) 02/27/2014   S/P PTCA (percutaneous  transluminal coronary angioplasty) 02/27/2014   Hyperlipidemia associated with type 2 diabetes mellitus ([place]) 11/12/2009   MULTIPLE SCLEROSIS 11/12/2009   HTN (hypertension) 11/12/2009   Shortness of breath 11/12/2009    Immunization History  Administered Date(s) Administered   Fluad Quad(high Dose 65+) 01/09/2019   Influenza, High Dose Seasonal PF 02/08/2017, 01/17/2018, 01/30/2020   Influenza,inj,Quad PF,6+ Mos 02/28/2014, 02/11/2015   Influenza-Unspecified 03/11/2017   PFIZER(Purple Top)SARS-COV-2 Vaccination 06/01/2019, 06/22/2019, 01/18/2020   Pneumococcal Conjugate-13 08/13/2015, 01/09/2019   Pneumococcal Polysaccharide-23 02/28/2014   Tdap 08/13/2015    Conditions to be addressed/monitored:  Hypertension, Hyperlipidemia, Diabetes, Heart Failure, Coronary Artery Disease, and Chronic Kidney Disease  There are no care plans that you recently modified to display for this patient.   Medication Assistance: None required.  Patient affirms current coverage meets needs.  Compliance/Adherence/Medication fill history: Care Gaps: Shingrix Covid booster (due 05/19/20) A1c (due 09/22/20) Colonoscopy (due 09/27/18)  Star-Rating Drugs: Atorvastatin - LF 01/10/21 90 ds  Patient's [facility] pharmacy [facility]:  [id number] [place], [place] [id number]: [phone]: [phone] Uses pill box? Yes Pt endorses 100% compliance  We discussed: Current pharmacy is preferred with insurance plan and patient is satisfied with pharmacy services Patient decided to: Continue current medication management strategy  Care Plan and Follow Up Patient Decision:  Patient agrees to Care Plan and Follow-up.  Plan: Telephone follow up appointment with care management team member scheduled  for:  3 months  LCharlene Brooke PharmD, BPara March CPP Clinical Pharmacist [facility] at [phone]   Current Barriers:  Unable to independently monitor therapeutic efficacy Difficult to self administer medications as prescribed  Pharmacist Clinical Goal(s):  Patient will achieve adherence to monitoring guidelines and medication adherence to achieve therapeutic efficacy through collaboration with PharmD and provider.   Interventions: 1:1 collaboration with CHoyt Koch MD regarding development and update of comprehensive plan of care as evidenced by provider attestation and co-signature Inter-disciplinary care team collaboration (see longitudinal plan of care) Comprehensive medication review performed; medication list updated in electronic medical record  Hypertension / Heart Failure (BP goal <130/80) -Controlled - pt endorses compliances with medications as prescribed; verbalized most recent medication changes -Current home readings: 129/70 -HF type: diastolic (EF 437-48%42/7078 -Current treatment: Metoprolol succinate 50 mg  - 3 tab daily Furosemide 80 mg + 40 mg (120 mg) BID Hydralazine 100 mg TID Isosorbide DN 30 mg BID Potassium 10 mEq daily -Current dietary habits: trying to limit salt -Current exercise habits: limited  -Educated on BP goals and benefits of medications for prevention of heart attack, stroke and kidney damage; Importance of home blood pressure monitoring; importance of limiting sodium to < 2000 mg/day -Advised to discuss swelling/wheezing at upcoming cardiology appt -Recommended to continue current medication  Hyperlipidemia/ CAD: (LDL goal < 70) -Controlled - most recent LDL was at goal in 07/2019 (right after CABG); pt endorses compliance with statin and clopidogrel; of note clopidogrel was stopped earlier this year by cardiologist, but restarted in hospitalization for CHF in May; -Hx CAD. CABG 46/7544w/ complicated  post-op including cardiac arrest and acute renal failure requiring HD. -Current treatment: Atorvastatin 20 mg daily Clopidogrel 75 mg daily Aspirin 81 mg daily Isosorbide DN 30 mg TID Nitroglycerin 0.4 mg SL prn -Educated on Cholesterol goals; Benefits of statin for ASCVD risk reduction; -Recommended to continue current medication -Repeat lipid panel due this year  Depression/Anxiety (Goal: manage symptoms) -Controlled - pt does not endorse issues with mental health currently -Current treatment: Sertraline 25 mg daily Clonazepam 0.5 mg BID prn -Medications previously tried/failed: mirtazapine (worked well for sleep, but appetite stimulation caused 25 lb wt gain in 1 month) -PHQ9: 0 (08/2020) -GAD7: not on file -Connected with PCP for mental health support -Educated on Benefits of medication for symptom control -Recommended to continue current medication  BPH (Goal: manage symptoms) -Controlled - Pt reports compliance with Rapaflo; he follows with Dr Alyson Ingles regularly; he wants to know if he can get a self-cath to use for convenience - he denies incontinence, problems with emptying bladder, etc -Current treatment  Rapaflo 8 mg -Counseled on usual indications for catheter; advised pt to discuss with urologist  CKD Stage 4 (Goal: prevent progression) -Not ideally controlled - pt follows with Dr Royce Macadamia and is preparing for dialysis; he had AV fistula procedure in May 2021;  -Counseled on importance of managing BP and DM to prevent further kidney damage  Health Maintenance -Vaccine gaps: Shingrix, Covid booster -Current therapy:  Vitamin D 50,000 IU weekly (Wed) Senna-docusate 1-2 tablets HS Ferrous sulfate 325 mg daily -Recommended to continue current medication  Patient Goals/Self-Care Activities Patient will:  - take medications as prescribed - Check BP daily and record results - Weigh daily; if gaining 2+ lbs overnight or 5+ lbs in a week, contact cardiology

## 2021-02-01 LAB — BRAIN NATRIURETIC PEPTIDE: BNP: 126.5 pg/mL — ABNORMAL HIGH (ref 0.0–100.0)

## 2021-02-03 NOTE — Progress Notes (Signed)
Chronic Care Management Pharmacy Note  02/03/2021 Name:  Christian [name] MRN:  [id number] DOB:  1946-01-14  Summary: -Pt's wife endorses compliance with medications as prescribed -Pt reports BP at home is at goal -Pt is due for repeat lipid panel (last 07/2019, at goal) and A1c  Recommendations/Changes made from today's visit: -Updated furosemide dose per nephrology recommendations (120 mg BID) -Counseled on low salt diet -Advised to make a PCP appt before end of year; recommend lipid panel and A1c   Subjective: Christian [name] is an 75 y.o. year old male who is a primary patient of Hoyt Koch, MD.  The CCM team was consulted for assistance with disease management and care coordination needs.    Engaged with patient by telephone for follow up visit in response to provider referral for pharmacy case management and/or care coordination services.   Consent to Services:  The patient was given information about Chronic Care Management services, agreed to services, and gave verbal consent prior to initiation of services.  Please see initial visit note for detailed documentation.   Patient Care Team: Hoyt Koch, MD as PCP - General (Internal Medicine) Gardiner Barefoot, DPM as Consulting Physician (Podiatry) McKenzie, Candee Furbish, MD as Consulting Physician (Urology) Woody Seller Sylvan Cheese, MD as Referring Physician (Cardiology) Charlton Haws, [facility] as Pharmacist (Pharmacist)   Patient lives at home with wife who handles his medications. She sets up pill box for him and keeps track of changes on med list. Patient is currently getting help from home health nurse 3 days a week but this service will end soon per patient. He is interested in getting into [facility] for supervised physical therapy and exercise.   Recent office visits: 12/23/20 Dr Sharlet Salina OV: hospital f/u (CAP): BP at goal; CXR stable w/ fluid  06/28/20 Dr Sharlet Salina OV: chronic f/u; pt gained  30 lbs since last month w/ appetite stimulation from mirtazapine. He slept a lot better. Decided to d/c mirtazapine, may consider restarting lower dose if sleep patterns worsen. Counseled DASH diet and low sodium diet.  05/28/20 Dr Sharlet Salina OV: f/u fatigue; rx'd mirtazapine; dc'd tamsulosin, Rapaflo, Renvela.  Recent consult visits: 01/29/21 PA Cantwell (cardiology): BP at goal; continued dyspnea; recent stress test w/o evidence of ischemia; ordered BNP given continued DOE; will consider pulmonology referral if normal  12/26/20 Dr Royce Macadamia (nephrology): titrate lasix back to 120 mg BID  12/18/20 PA Cantwell (cardiology): hospital f/u; BP elevated; increase hydralazine to 100 mg TID  10/14/20 PA Cantwell (cardiology): BP elevated (recent reduction of metoprolol to 150 mg in hospital); increase hydralazine to 50 mg TID.  09/24/20 Dr Royce Macadamia (nephrology): f/u CKD. No med changes.  09/13/20 Dr Donzetta Matters (vascular surgery): eval for dialysis access. Plan for AV fistula  08/29/20 Dr Einar Gip (cardiology): BP/HR improved with 200 mg metoprolol. D/C plavix due to 1 year on DAPT.  07/11/20 Dr Einar Gip (cardiology): increased metoprolol to 200 mg daily  05/09/20 Dr Royce Macadamia (nephrology): increased furosmide to 80 mg BID. Referred to [facility] for AVF placement. F/u with urology for incontinence issues  Hospital visits: Medication Reconciliation was completed by comparing discharge summary, patient's EMR and Pharmacy list, and upon discussion with patient.  Admitted to the hospital on 12/11/20 due to CAP, Pleural effusion. Discharge date was 12/16/20. Discharged from [facility].   -Completed ceftriaxone and azithro x 5 days -rec'd OP stress test  Medication Changes at Hospital Discharge: -Changed furosemide to 80 mg BID  Medications that remain the  same after Hospital Discharge:??  -All other medications will remain the same.    10/31/20 admission for vein transposition.  Admitted to the hospital on 10/03/20 due to CHF  exacerbation Discharge date was 10/07/20. Discharged from [facility]?Medications Started at [facility] Discharge:?? -started potassium 40 meq x 15 days  -clopidogrel 75 mg (restarted)  Medication Changes at Hospital Discharge: -Changed furosemide to 120 mg BID for diuresis -Reduced metoprolol to 150 mg given volume overload  Medications Discontinued at Hospital Discharge: -Stopped oxycodone  Medications that remain the same after Hospital Discharge:??  -All other medications will remain the same.    09/18/20 admission to Vascular for AV fistula.  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  Objective:  Lab Results  Component Value Date   CREATININE 2.52 (H) 12/16/2020   BUN 38 (H) 12/16/2020   GFR 17.66 (L) 05/28/2020   GFRNONAA 26 (L) 12/16/2020   GFRAA 19 (L) 10/18/2019   NA 139 12/16/2020   K 3.9 12/16/2020   CALCIUM 8.9 12/16/2020   CO2 23 12/16/2020   GLUCOSE 127 (H) 12/16/2020    Lab Results  Component Value Date/Time   HGBA1C 5.4 03/25/2020 11:55 AM   HGBA1C 4.9 12/20/2019 01:12 PM   GFR 17.66 (L) 05/28/2020 03:38 PM   GFR 18.69 (L) 03/25/2020 11:55 AM    Last diabetic Eye exam:  Lab Results  Component Value Date/Time   HMDIABEYEEXA No Retinopathy 05/30/2020 10:25 AM    Last diabetic Foot exam: No results found for: HMDIABFOOTEX   Lab Results  Component Value Date   CHOL 107 08/11/2019   HDL 42 08/11/2019   LDLCALC 40 08/11/2019   LDLDIRECT 40.0 11/23/2018   TRIG 71 08/27/2019   CHOLHDL 2.5 08/11/2019    Hepatic Function Latest Ref Rng & Units 10/04/2020 05/28/2020 03/25/2020  Total Protein 6.5 - 8.1 g/dL 7.0 8.0 -  Albumin 3.5 - 5.0 g/dL 3.8 4.1 4.1  AST 15 - 41 U/L 32 32 -  ALT 0 - 44 U/L 52(H) 28 -  Alk Phosphatase 38 - 126 U/L 80 80 -  Total Bilirubin 0.3 - 1.2 mg/dL 0.5 0.3 -    Lab Results   Component Value Date/Time   TSH 1.24 05/28/2020 03:38 PM   TSH 2.590 04/19/2019 09:11 AM    CBC Latest Ref Rng & Units 12/12/2020 12/11/2020 10/31/2020  WBC 4.0 - 10.5 K/uL 8.4 9.2 -  Hemoglobin 13.0 - 17.0 g/dL 11.7(L) 10.8(L) 11.2(L)  Hematocrit 39.0 - 52.0 % 38.1(L) 35.9(L) 33.0(L)  Platelets 150 - 400 K/uL 218 203 -    Lab Results  Component Value Date/Time   VD25OH 58.52 05/28/2020 03:38 PM   VD25OH 95.53 10/19/2017 11:10 AM    Clinical ASCVD: Yes  The ASCVD Risk score (Arnett DK, et al., 2019) failed to calculate for the following reasons:   The patient has a prior MI or stroke diagnosis    Depression screen [facility] 2/9 09/12/2020 09/12/2020 06/25/2020  Decreased Interest 0 0 0  Down, Depressed, Hopeless 0 0 0  PHQ - 2 Score 0 0 0  Some recent data might be hidden     Social History   Tobacco Use  Smoking Status Former   Packs/day: 0.50   Years: 20.00   Pack years: 10.00   Types: Cigarettes   Quit date: 1975   Years since quitting:  47.8  Smokeless Tobacco Never   BP Readings from Last 3 Encounters:  01/29/21 129/70  12/23/20 134/80  12/18/20 (!) 144/70   Pulse Readings from Last 3 Encounters:  01/29/21 89  12/23/20 (!) 102  12/18/20 91   Wt Readings from Last 3 Encounters:  01/29/21 212 lb (96.2 kg)  12/23/20 214 lb 6.4 oz (97.3 kg)  12/18/20 208 lb (94.3 kg)   BMI Readings from Last 3 Encounters:  01/29/21 33.20 kg/m  12/23/20 33.58 kg/m  12/18/20 32.58 kg/m    Assessment/Interventions: Review of patient past medical history, allergies, medications, health status, including review of consultants reports, laboratory and other test data, was performed as part of comprehensive evaluation and provision of chronic care management services.   SDOH:  (Social Determinants of Health) assessments and interventions performed: Yes  SDOH Screenings   Alcohol Screen: Not on file  Depression (PHQ2-9): Low Risk    PHQ-2 Score: 0  Financial Resource Strain: Low  Risk    Difficulty of Paying Living Expenses: Not very hard  Food Insecurity: Not on file  Housing: Not on file  Physical Activity: Not on file  Social Connections: Not on file  Stress: Not on file  Tobacco Use: Medium Risk   Smoking Tobacco Use: Former   Smokeless Tobacco Use: Never  Transportation Needs: Not on file    [place]  No Known Allergies  Medications Reviewed Today     Reviewed by Charlton Haws, [place] (Pharmacist) on 01/30/21 at [place] List Status: <None>   Medication Order Taking? Sig Documenting Provider Last Dose Status Informant  albuterol (VENTOLIN HFA) 108 (90 Base) MCG/ACT inhaler [id number] Yes Inhale 2 puffs into the lungs every 6 (six) hours as needed for wheezing or shortness of breath. Shawna Clamp, MD Taking Active   aspirin EC 81 MG tablet [id number] Yes Take 81 mg by mouth daily. [provider] Taking Active Multiple Informants  atorvastatin (LIPITOR) 20 MG tablet [id number] Yes Take 1 tablet (20 mg total) by mouth every evening. Adrian Prows, MD Taking Active Multiple Informants  clonazePAM (KLONOPIN) 0.5 MG disintegrating tablet [id number] Yes TAKE 1 TABLET BY MOUTH TWICE A DAY  Patient taking differently: Take 0.5 mg by mouth 2 (two) times daily.   Hoyt Koch, MD Taking Active   clopidogrel (PLAVIX) 75 MG tablet [id number] Yes TAKE 1 TABLET BY MOUTH EVERY DAY  Patient taking differently: Take 75 mg by mouth daily.   Adrian Prows, MD Taking Active   cromolyn (OPTICROM) 4 % ophthalmic solution [id number] Yes Place 1 drop into both eyes every Wednesday. At bedtime [provider] Taking Active Multiple Informants  [facility] ACETAMINOPHEN 325 MG tablet [id number] Yes TAKE 2 CAPS BY MOUTH EVERY 6 HOURS AS NEEDED FOR PAIN FOR TEMP 100F OR ABOVE  Patient taking differently: Take 325 mg by mouth every 6 (six) hours as needed for mild pain or headache (temp over 100).   Hoyt Koch, MD Taking Active   [employer] SENNA PLUS 8.6-50  MG tablet [id number] Yes TAKE 2 TABLETS BY MOUTH AT BEDTIME FOR CONSTIPATION  Patient taking differently: Take 1 tablet by mouth at bedtime.   Hoyt Koch, MD Taking Active   DM-APAP-CPM (CORICIDIN HBP PO) [id number] Yes Take 1 tablet by mouth every 6 (six) hours as needed (congestion/cough). [provider] Taking Active Multiple Informants  ferrous sulfate 325 (65 FE) MG tablet [id number] Yes Take 325 mg by mouth daily with breakfast. [provider] Taking Active  Multiple Informants           Med Note (CRUZ, RICARDO K   Wed Apr 24, 2020  2:09 PM)    furosemide (LASIX) 80 MG tablet [id number] Yes Take 120 mg by mouth 2 (two) times daily. [provider] Taking Active Multiple Informants  hydrALAZINE (APRESOLINE) 100 MG tablet [id number] Yes Take 1 tablet (100 mg total) by mouth 3 (three) times daily. Cantwell, Celeste C, PA-C Taking Active   isosorbide dinitrate (ISORDIL) 30 MG tablet [id number] Yes TAKE 1 TABLET BY MOUTH THREE TIMES A DAY  Patient taking differently: Take 30 mg by mouth 2 (two) times daily.   Cantwell, Celeste C, PA-C Taking Active   metoprolol succinate (TOPROL-XL) 50 MG 24 hr tablet [id number] Yes TAKE 3 TABLETS BY MOUTH EVERY DAY Cantwell, Celeste C, PA-C Taking Active   multivitamin (RENA-VIT) TABS tablet [id number] Yes Take 1 tablet by mouth at bedtime. Barrett, Lodema Hong, PA-C Taking Active Multiple Informants  nitroGLYCERIN (NITROSTAT) 0.4 MG SL tablet [id number] Yes Place 0.4 mg under the tongue every 5 (five) minutes as needed for chest pain. [provider] Taking Active Multiple Informants  oxyCODONE-acetaminophen (PERCOCET) 5-325 MG tablet [id number] Yes Take 1 tablet by mouth every 6 (six) hours as needed for severe pain. Ulyses Amor, PA-C Taking Active Multiple Informants  potassium chloride (KLOR-CON) 10 MEQ tablet [id number] Yes Take 10 mEq by mouth daily. [provider] Taking Active Multiple Informants  PREVIDENT  5000 BOOSTER PLUS 1.1 % PSTE [id number] Yes Take 1 application by mouth every evening. [provider] Taking Active Multiple Informants  sertraline (ZOLOFT) 25 MG tablet [id number] Yes TAKE 1 TABLET ONE TIME A DAY Hoyt Koch, MD Taking Active   silodosin (RAPAFLO) 8 MG CAPS capsule [id number] Yes TAKE 1 CAPSULE BY MOUTH EVERYDAY AT BEDTIME  Patient taking differently: Take 8 mg by mouth at bedtime.   McKenzie, Candee Furbish, MD Taking Active   Vitamin D, Ergocalciferol, (DRISDOL) 1.25 MG (50000 UNIT) CAPS capsule [id number] Yes Take 1 capsule (50,000 Units total) by mouth every 7 (seven) days.  Patient taking differently: Take 50,000 Units by mouth every Wednesday.   Hoyt Koch, MD Taking Active             Patient Active Problem List   Diagnosis Date Noted   Atypical pneumonia 12/12/2020   Pleural effusion 12/12/2020   Fluid overload 12/12/2020   Pulmonary edema 10/03/2020   Sleep difficulties 05/30/2020   Poor appetite 05/30/2020   Anemia due to blood loss 04/24/2020   CKD (chronic kidney disease) stage 4, GFR 15-29 ml/min (HCC) 03/27/2020   Aortic atherosclerosis ([place]) 03/27/2020   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   Pruritus, unspecified 11/28/2019   Unspecified protein-calorie malnutrition ([place]) 10/27/2019   Secondary hyperparathyroidism of renal origin ([place]) 10/17/2019   Coronary artery disease 08/15/2019   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  LIMA TO LAD, RIMA TO PDA, RADIAL ARTERY TO CIRC AND SEQUENTIALLY TO OM1. 08/15/2019   Ischemic cardiomyopathy    Swelling of lower extremity    History of coronary artery stent placement    Pulmonary embolism ([place]) 08/09/2019   Elevated liver enzymes 08/09/2019   Left leg weakness 11/23/2018   CAD (coronary artery disease) 07/30/2018   Other fatigue 10/19/2017   Routine general medical examination at a health care facility 08/30/2016   Diabetes mellitus with  polyneuropathy ([place]) 10/10/2014   Benign prostatic hyperplasia 09/27/2014  Stable angina pectoris ([place]) 02/27/2014   S/P PTCA (percutaneous transluminal coronary angioplasty) 02/27/2014   Hyperlipidemia associated with type 2 diabetes mellitus ([place]) 11/12/2009   MULTIPLE SCLEROSIS 11/12/2009   HTN (hypertension) 11/12/2009   Shortness of breath 11/12/2009    Immunization History  Administered Date(s) Administered   Fluad Quad(high Dose 65+) 01/09/2019   Influenza, High Dose Seasonal PF 02/08/2017, 01/17/2018, 01/30/2020   Influenza,inj,Quad PF,6+ Mos 02/28/2014, 02/11/2015   Influenza-Unspecified 03/11/2017   PFIZER(Purple Top)SARS-COV-2 Vaccination 06/01/2019, 06/22/2019, 01/18/2020   Pneumococcal Conjugate-13 08/13/2015, 01/09/2019   Pneumococcal Polysaccharide-23 02/28/2014   Tdap 08/13/2015    Conditions to be addressed/monitored:  Hypertension, Hyperlipidemia, Diabetes, Heart Failure, Coronary Artery Disease, and Chronic Kidney Disease  Care Plan : [place]  Updates made by Charlton Haws, [place] since 02/03/2021 12:00 AM     Problem: HTN, HLD, CAD, DM, CKD IV   Priority: High     Long-Range Goal: Disease management   Start Date: 05/24/2020  Expected End Date: 02/03/2022  This Visit's Progress: On track  Recent Progress: On track  Priority: High  Note:   Current Barriers:  Unable to independently monitor therapeutic efficacy Difficult to self administer medications as prescribed  Pharmacist Clinical Goal(s):  Patient will achieve adherence to monitoring guidelines and medication adherence to achieve therapeutic efficacy through collaboration with PharmD and provider.   Interventions: 1:1 collaboration with Hoyt Koch, MD regarding development and update of comprehensive plan of care as evidenced by provider attestation and co-signature Inter-disciplinary care team collaboration (see longitudinal plan of care) Comprehensive medication  review performed; medication list updated in electronic medical record  Hypertension / Heart Failure (BP goal <130/80) -Controlled - pt endorses compliances with medications as prescribed; he reports home BP readings are good -Current home readings: 129/70 -HF type: diastolic (EF 55-20% 11/221) -Current treatment: Metoprolol succinate 50 mg  - 3 tab daily Furosemide 80 mg + 40 mg (120 mg) BID Hydralazine 100 mg TID Isosorbide DN 30 mg BID Potassium 10 mEq daily -Current dietary habits: trying to limit salt -Current exercise habits: limited  -Educated on BP goals and benefits of medications for prevention of heart attack, stroke and kidney damage; Importance of home blood pressure monitoring; importance of limiting sodium to < 2000 mg/day -Recommended to continue current medication  Hyperlipidemia/ CAD: (LDL goal < 70) -Controlled - most recent LDL was at goal in 07/2019 (right after CABG); pt endorses compliance with statin and clopidogrel; of note clopidogrel was stopped earlier this year by cardiologist, but restarted in hospitalization for CHF in May; -Hx CAD. CABG 06/6120 w/ complicated post-op including cardiac arrest and acute renal failure requiring HD. -Current treatment: Atorvastatin 20 mg daily Clopidogrel 75 mg daily Aspirin 81 mg daily Isosorbide DN 30 mg TID Nitroglycerin 0.4 mg SL prn -Educated on Cholesterol goals; Benefits of statin for ASCVD risk reduction; -Recommended to continue current medication -Repeat lipid panel due this year  Depression/Anxiety (Goal: manage symptoms) -Controlled - pt does not endorse issues with mental health currently -Current treatment: Sertraline 25 mg daily Clonazepam 0.5 mg BID prn -Medications previously tried/failed: mirtazapine (worked well for sleep, but appetite stimulation caused 25 lb wt gain in 1 month) -PHQ9: 0 (08/2020) -GAD7: not on file -Connected with PCP for mental health support -Recommended to continue current  medication  CKD Stage 4 (Goal: prevent progression) -Not ideally controlled - pt follows with Dr Royce Macadamia and is preparing for dialysis; he had AV fistula procedure in May 2021;  -Counseled on  importance of managing BP and DM to prevent further kidney damage  Health Maintenance -Vaccine gaps: Shingrix, Covid booster -Advised to get covid booster and shingrix  Patient Goals/Self-Care Activities Patient will:  - take medications as prescribed - Check BP daily and record results - Weigh daily; if gaining 2+ lbs overnight or 5+ lbs in a week, contact cardiology      Medication Assistance: None required.  Patient affirms current coverage meets needs.  Compliance/Adherence/Medication fill history: Care Gaps: A1c (due 09/22/20) Colonoscopy (due 09/27/18)  Star-Rating Drugs: Atorvastatin - LF 01/10/21 90 ds  Patient's preferred pharmacy is:  [id number] [place], [address]  [id number]: [phone]: [phone] Uses pill box? Yes Pt endorses 100% compliance  We discussed: Current pharmacy is preferred with insurance plan and patient is satisfied with pharmacy services Patient decided to: Continue current medication management strategy  Care Plan and Follow Up Patient Decision:  Patient agrees to Care Plan and Follow-up.  Plan: Telephone follow up appointment with care management team member scheduled for:  3 months  LCharlene Brooke PharmD, BParker CPP Clinical Pharmacist [facility] [phone]

## 2021-02-03 NOTE — Patient Instructions (Addendum)
Visit Information  Phone number for Pharmacist: [phone]   Goals Addressed             This Visit's Progress    Follow My Treatment Plan-Chronic Kidney       Timeframe:  Long-Range Goal Priority:  High Start Date:       05/24/20                      Expected End Date:       02/03/22  Follow up date: Jan 2023            - ask for help if I can't afford my medicines - call for medicine refill 2 or 3 days before it runs out - call the doctor or nurse before I stop taking medicine - call the doctor or nurse to get help with side effects - keep follow-up appointments - keep taking my medicines, even when I feel good    Why is this important?   Staying as healthy as you can is very important. This may mean making changes if you smoke, don't exercise or eat poorly.  A healthy lifestyle is an important goal for you.  Following the treatment plan and making changes may be hard.  Try some of these steps to help keep the disease from getting worse.       Track and Manage Fluids and Swelling-Heart Failure       Timeframe:  Long-Range Goal Priority:  High Start Date:   11/06/20                          Expected End Date:    02/03/22              Follow Up Date Jan 2023   - call office if I gain more than 2 pounds in one day or 5 pounds in one week - keep legs up while sitting - track weight in diary - use salt in moderation - watch for swelling in feet, ankles and legs every day - weigh myself daily    Why is this important?   It is important to check your weight daily and watch how much salt and liquids you have.  It will help you to manage your heart failure.    Notes:         Care Plan : [place]  Updates made by Charlton Haws, RPH since 02/03/2021 12:00 AM     Problem: HTN, HLD, CAD, DM, CKD IV   Priority: High     Long-Range Goal: Disease management   Start Date: 05/24/2020  Expected End Date: 02/03/2022  This Visit's Progress: On  track  Recent Progress: On track  Priority: High  Note:   Current Barriers:  Unable to independently monitor therapeutic efficacy Difficult to self administer medications as prescribed  Pharmacist Clinical Goal(s):  Patient will achieve adherence to monitoring guidelines and medication adherence to achieve therapeutic efficacy through collaboration with PharmD and provider.   Interventions: 1:1 collaboration with Hoyt Koch, MD regarding development and update of comprehensive plan of care as evidenced by provider attestation and co-signature Inter-disciplinary care team collaboration (see longitudinal plan of care) Comprehensive medication review performed; medication list updated in electronic medical record  Hypertension / Heart Failure (BP goal <130/80) -Controlled - pt endorses compliances with medications as prescribed; he reports home BP readings are good -Current home readings: 129/70 -HF type:  diastolic (EF 17-61% 09/735) -Current treatment: Metoprolol succinate 50 mg  - 3 tab daily Furosemide 80 mg + 40 mg (120 mg) BID Hydralazine 100 mg TID Isosorbide DN 30 mg BID Potassium 10 mEq daily -Current dietary habits: trying to limit salt -Current exercise habits: limited  -Educated on BP goals and benefits of medications for prevention of heart attack, stroke and kidney damage; Importance of home blood pressure monitoring; importance of limiting sodium to < 2000 mg/day -Recommended to continue current medication  Hyperlipidemia/ CAD: (LDL goal < 70) -Controlled - most recent LDL was at goal in 07/2019 (right after CABG); pt endorses compliance with statin and clopidogrel; of note clopidogrel was stopped earlier this year by cardiologist, but restarted in hospitalization for CHF in May; -Hx CAD. CABG 04/624 w/ complicated post-op including cardiac arrest and acute renal failure requiring HD. -Current treatment: Atorvastatin 20 mg daily Clopidogrel 75 mg  daily Aspirin 81 mg daily Isosorbide DN 30 mg TID Nitroglycerin 0.4 mg SL prn -Educated on Cholesterol goals; Benefits of statin for ASCVD risk reduction; -Recommended to continue current medication -Repeat lipid panel due this year  Depression/Anxiety (Goal: manage symptoms) -Controlled - pt does not endorse issues with mental health currently -Current treatment: Sertraline 25 mg daily Clonazepam 0.5 mg BID prn -Medications previously tried/failed: mirtazapine (worked well for sleep, but appetite stimulation caused 25 lb wt gain in 1 month) -PHQ9: 0 (08/2020) -GAD7: not on file -Connected with PCP for mental health support -Recommended to continue current medication  CKD Stage 4 (Goal: prevent progression) -Not ideally controlled - pt follows with Dr Royce Macadamia and is preparing for dialysis; he had AV fistula procedure in May 2021;  -Counseled on importance of managing BP and DM to prevent further kidney damage  Health Maintenance -Vaccine gaps: Shingrix, Covid booster -Advised to get covid booster and shingrix  Patient Goals/Self-Care Activities Patient will:  - take medications as prescribed - Check BP daily and record results - Weigh daily; if gaining 2+ lbs overnight or 5+ lbs in a week, contact cardiology      The patient verbalized understanding of instructions, educational materials, and care plan provided today and declined offer to receive copy of patient instructions, educational materials, and care plan.  Telephone follow up appointment with pharmacy team member scheduled for: 3 months  Charlene Brooke, PharmD, [place], CPP Clinical Pharmacist [facility] [phone]

## 2021-02-03 NOTE — Progress Notes (Signed)
BNP is very minimally elevated. Please inquire if his SOB has improved, stayed same or worsened?

## 2021-02-04 NOTE — Progress Notes (Signed)
Called and spoke with patient regarding his lab results. Patient stated that his SOB seems to be getting shorter. He thought he was better, but got up to walk to the restroom and ran completely out of breath, and he said that he hadn't come back to his chair yet.

## 2021-02-11 NOTE — Progress Notes (Signed)
Please schedule patient for office visit next week.

## 2021-02-12 NOTE — Progress Notes (Signed)
Please call patient and schedule a FU with Celeste for next week. Thank you.

## 2021-02-14 ENCOUNTER — Other Ambulatory Visit: Payer: Self-pay

## 2021-02-14 ENCOUNTER — Ambulatory Visit (INDEPENDENT_AMBULATORY_CARE_PROVIDER_SITE_OTHER): Payer: Medicare Other | Admitting: Podiatry

## 2021-02-14 DIAGNOSIS — E1142 Type 2 diabetes mellitus with diabetic polyneuropathy: Secondary | ICD-10-CM

## 2021-02-14 DIAGNOSIS — M79674 Pain in right toe(s): Secondary | ICD-10-CM

## 2021-02-14 DIAGNOSIS — M79675 Pain in left toe(s): Secondary | ICD-10-CM | POA: Diagnosis not present

## 2021-02-14 DIAGNOSIS — B351 Tinea unguium: Secondary | ICD-10-CM

## 2021-02-21 ENCOUNTER — Encounter: Payer: Self-pay | Admitting: Podiatry

## 2021-02-21 NOTE — Progress Notes (Signed)
  Subjective:  Patient ID: Christian [name], male    DOB: Oct 07, 1945,  MRN: [id number]  Christian [name] presents to clinic today for at risk foot care with history of diabetic neuropathy and thick, elongated toenails b/l feet which are tender when wearing enclosed shoe gear.  He is accompanied by his wife on today's visit. Patient did not check blood glucose today.  PCP is Hoyt Koch, MD , and last visit was 12/23/2020.  No Known Allergies  Review of Systems: Negative except as noted in the HPI. Objective:   Constitutional Christian [name] is a pleasant 75 y.o. African American male, WD, WN in NAD. AAO x 3.   Vascular Capillary refill time to digits immediate b/l. Palpable PT pulse(s) b/l lower extremities Faintly palpable DP pulse(s) b/l lower extremities. Pedal hair sparse. Lower extremity skin temperature gradient within normal limits. No pain with calf compression RLE. Trace edema noted BLE. No cyanosis or clubbing noted.  Neurologic Normal speech. Oriented to person, place, and time. Pt has subjective symptoms of neuropathy. Protective sensation diminished with 10g monofilament b/l.  Dermatologic Pedal integument with normal turgor, texture and tone BLE. Toenails 2-5 bilaterally and R hallux elongated, discolored, dystrophic, thickened, and crumbly with subungual debris and tenderness to dorsal palpation. Anonychia noted L hallux. Nailbed(s) epithelialized.   Orthopedic: Normal muscle strength 5/5 to all lower extremity muscle groups bilaterally. No pain crepitus or joint limitation noted with ROM b/l lower extremities. No gross bony deformities b/l lower extremities. Utilizes cane for ambulation assistance.   Radiographs: None Assessment:   1. Pain due to onychomycosis of toenails of both feet   2. Diabetic polyneuropathy associated with type 2 diabetes mellitus ([place])    Plan:  Patient was evaluated and treated and all questions answered. Consent given for treatment as  described below: -No new findings. No new orders. -Continue diabetic foot care principles: inspect feet daily, monitor glucose as recommended by PCP and/or Endocrinologist, and follow prescribed diet per PCP, Endocrinologist and/or dietician. -Toenails 2-5 bilaterally and R hallux debrided in length and girth without iatrogenic bleeding with sterile nail nipper and dremel.  -Patient/POA to call should there be question/concern in the interim.  Return in about 3 months (around 05/17/2021).  Marzetta Board, DPM

## 2021-02-24 ENCOUNTER — Encounter: Payer: Self-pay | Admitting: Student

## 2021-02-24 ENCOUNTER — Other Ambulatory Visit: Payer: Self-pay | Admitting: Student

## 2021-02-24 ENCOUNTER — Other Ambulatory Visit: Payer: Self-pay

## 2021-02-24 ENCOUNTER — Ambulatory Visit: Payer: Federal, State, Local not specified - PPO | Admitting: Student

## 2021-02-24 VITALS — BP 129/62 | HR 78 | Temp 98.5°F | Resp 17 | Ht 67.0 in | Wt 209.8 lb

## 2021-02-24 DIAGNOSIS — E1169 Type 2 diabetes mellitus with other specified complication: Secondary | ICD-10-CM | POA: Diagnosis not present

## 2021-02-24 DIAGNOSIS — R0609 Other forms of dyspnea: Secondary | ICD-10-CM

## 2021-02-24 DIAGNOSIS — I10 Essential (primary) hypertension: Secondary | ICD-10-CM | POA: Diagnosis not present

## 2021-02-24 DIAGNOSIS — E785 Hyperlipidemia, unspecified: Secondary | ICD-10-CM | POA: Diagnosis not present

## 2021-02-24 DIAGNOSIS — I5032 Chronic diastolic (congestive) heart failure: Secondary | ICD-10-CM

## 2021-02-24 DIAGNOSIS — R0602 Shortness of breath: Secondary | ICD-10-CM

## 2021-02-24 DIAGNOSIS — I5042 Chronic combined systolic (congestive) and diastolic (congestive) heart failure: Secondary | ICD-10-CM

## 2021-02-24 DIAGNOSIS — N184 Chronic kidney disease, stage 4 (severe): Secondary | ICD-10-CM

## 2021-02-24 NOTE — Progress Notes (Signed)
Primary Physician/Referring:  Hoyt Koch, MD  Patient ID: Christian [name], male    DOB: March 11, 1946, 75 y.o.   MRN: [id number]  Chief Complaint  Patient presents with   Follow-up   BNP   HPI:    Christian [name]  is a 75 y.o. AA male with hypertension, hyperlipidemia, DM with stage 3 CKD,  CAD with  stenting to the PDA on 02/27/2014. Patient presented with unstable angina and NSTEMI on 08/09/2019 along with a very small subsegmental pulmonary embolism and discharged on 10/18/2019 after he underwent CABG x4 on 08/15/2019.   He had extremely complex postop complications including cardiogenic shock, cardiac arrest, needing internal CPR in the ICU after opening the chest, A. Fib needing cardioversion, closure office chest wall, developed acute renal failure leading to need for permanent dialysis, right brain infarct with left hemiparesis, multiple blood transfusions.  He was also placed on dialysis however is now off of dialysis since 02/01/2020.   Echocardiogram 11/2020 revealed mildly reduced LVEF at 45-50%, therefore patient underwent stress test which was overall low risk.  Last office visit patient complained of dyspnea, therefore obtained BNP which was mildly elevated and patient reported mild worsening shortness of breath.  He now presents for follow-up.  Patient notes that he is continue to need his albuterol inhaler frequently, up to a couple times per day.  Given that his BNP was mildly elevated and patient's shortness of breath had continued to bother him advised patient to come in for a visit.  On exam patient does not exhibit overt volume overload, in fact his bilateral leg edema is better than it has been recently.  He continues to take Lasix 100 mg twice p.o. twice daily per nephrology  Denies chest pain, palpitations, syncope, near syncope.  Denies orthopnea, PND.   Past Medical History:  Diagnosis Date   Anemia    low iron   BPH (benign prostatic hypertrophy)    CHF  (congestive heart failure) (HCC)    Chronic kidney disease    Coronary artery disease    Dry eyes left   Hiatal hernia    Hx of CABG 08/15/2019: x 4 using bilateral IMAs and left radial artery .  LIMA TO LAD, RIMA TO PDA, RADIAL ARTERY TO CIRC AND SEQUENTIALLY TO OM1. 08/15/2019   Hyperlipidemia    Hypertension    Incomplete bladder emptying    Myocardial infarction ([place]) 08/09/2019   Nocturia    PE (pulmonary thromboembolism) ([place]) 08/09/2019   small RLL PE 08/09/19   Pneumonia    as a child   Pre-diabetes    Problems with swallowing pt states test at [facility] approx 2012 shows a gastric valve  dysfunction--  eats small bites and drink liquids slowly   SOB (shortness of breath) on exertion    Past Surgical History:  Procedure Laterality Date   APPLICATION OF WOUND VAC N/A 08/24/2019   Procedure: APPLICATION OF WOUND VAC;  Surgeon: [name], MD;  Location: [place];  Service: Thoracic;  Laterality: N/A;   APPLICATION OF WOUND VAC  08/29/2019   Procedure: Wound Vac change;  Surgeon: [name], MD;  Location: [place] [facility];  Service: Open Heart Surgery;;   APPLICATION OF WOUND VAC N/A 09/04/2019   Procedure: Application Of Wound Vac;  Surgeon: Grace Magnum, MD;  Location: [place];  Service: Open Heart Surgery;  Laterality: N/A;   APPLICATION OF WOUND VAC N/A 09/06/2019   Procedure: APPLICATION OF ACELL, APPLICATION OF WOUND  VAC USING PREVENA INCISIONAL  DRESSING;  Surgeon: Wallace Going, DO;  Location: [place];  Service: Plastics;  Laterality: N/A;   AV FISTULA PLACEMENT Left 09/18/2020   Procedure: ARTERIOVENOUS (AV) FISTULA CREATION LEFT VERSUS GRAFT;  Surgeon: Serafina Mitchell, MD;  Location: [place];  Service: Vascular;  Laterality: Left;   [place] Left 10/31/2020   Procedure: LEFT SECOND STAGE [place];  Surgeon: Serafina Mitchell, MD;  Location[facility];  Service: Vascular;  Laterality: Left;   CARDIAC CATHETERIZATION     CORONARY ARTERY  BYPASS GRAFT N/A 08/15/2019   Procedure: CORONARY ARTERY BYPASS GRAFTING (CABG), x 4 using bilateral IMAs and left radial artery .  LIMA TO LAD, RIMA TO PDA, RADIAL ARTERY TO CIRC AND SEQUENTIALLY TO OM1.;  Surgeon: [name], MD;  Location: [place];  Service: Open Heart Surgery;  Laterality: N/A;   CORONARY STENT PLACEMENT  02/27/2014   distal rt/pd coronary       dr Einar Gip   CYSTO/ BLADDER BIOPSY'S/ CAUTHERIZATION  01-14-2004  DR Gaynelle Arabian   EXPLORATION POST OPERATIVE OPEN HEART N/A 08/16/2019   Procedure: Chest Closure S?P CABG WITH APPLICATION OF PREVENA  INCISIONAL WOUND VAC;  Surgeon: [name], MD;  Location: [place];  Service: Open Heart Surgery;  Laterality: N/A;   EXPLORATION POST OPERATIVE OPEN HEART N/A 08/21/2019   Procedure: CHEST WASHOUT S/P OPEN CHEST;  Surgeon: [name], MD;  Location: [place];  Service: Open Heart Surgery;  Laterality: N/A;  Open chest with Esmark dressing with Ioban sealant coverage.   EXPLORATION POST OPERATIVE OPEN HEART N/A 08/18/2019   Procedure: EXPLORATION POST OPERATIVE OPEN HEART (performed 04/23 on unit);  Surgeon: [name], MD;  Location: [place];  Service: Open Heart Surgery;  Laterality: N/A;   EXPLORATION POST OPERATIVE OPEN HEART N/A 08/24/2019   Procedure: CHEST WASHOUT POST OPERATIVE OPEN HEART;  Surgeon: [name], MD;  Location: [place];  Service: Open Heart Surgery;  Laterality: N/A;   EXPLORATION POST OPERATIVE OPEN HEART N/A 08/29/2019   Procedure: CHEST WOUND WASHOUT POST OPERATIVE OPEN HEART;  Surgeon: [name], MD;  Location: [place];  Service: Open Heart Surgery;  Laterality: N/A;   EXPLORATION POST OPERATIVE OPEN HEART N/A 09/04/2019   Procedure: MEDIASTINAL EXPLORATION WITH STERNAL WOUND IRRIGATION;  Surgeon: Grace Geremy, MD;  Location: [place];  Service: Open Heart Surgery;  Laterality: N/A;   EXPLORATION POST OPERATIVE OPEN HEART N/A 09/14/2019   Procedure: EVACUATION OF HEMATOMA;  Surgeon: Grace Dorrance, MD;  Location: [place];  Service: Open Heart Surgery;  Laterality: N/A;   IR FLUORO GUIDE CV LINE LEFT  09/19/2019   IR GASTROSTOMY TUBE MOD SED  10/04/2019   IR GASTROSTOMY TUBE REMOVAL  11/29/2019   IR PATIENT EVAL TECH 0-60 MINS  09/29/2019   IR US GUIDE VASC ACCESS LEFT  09/19/2019   LAPAROSCOPIC LYSIS OF ADHESIONS N/A 09/06/2019   Procedure: LAPAROSCOPIC OMENTAL HARVEST;  Surgeon: Mickeal Skinner, MD;  Location: [place];  Service: General;  Laterality: N/A;   LEFT HEART CATH AND CORONARY ANGIOGRAPHY N/A 08/10/2019   Procedure: LEFT HEART CATH AND CORONARY ANGIOGRAPHY;  Surgeon: Nigel Mormon, MD;  Location: [place] [facility];  Service: Cardiovascular;  Laterality: N/A;   LEFT HEART CATHETERIZATION WITH CORONARY ANGIOGRAM N/A 02/27/2014   Procedure: LEFT HEART CATHETERIZATION WITH CORONARY ANGIOGRAM;  Surgeon: Laverda Page, MD;  Location: [facility] CATH LAB;  Service: Cardiovascular;  Laterality: N/A;   MEDIASTINAL EXPLORATION N/A 09/06/2019   Procedure: MEDIASTINAL EXPLORATION;  Surgeon: Grace Jahi, MD;  Location: [place];  Service: Thoracic;  Laterality: N/A;   PECTORALIS FLAP  09/06/2019   Procedure: Pectoralis ADVANCEMENT Flap;  Surgeon: Wallace Going, DO;  Location: [place];  Service: Plastics;;   PERCUTANEOUS CORONARY STENT INTERVENTION (PCI-S)  02/27/2014   Procedure: PERCUTANEOUS CORONARY STENT INTERVENTION (PCI-S);  Surgeon: Laverda Page, MD;  Location[facility] CATH LAB;  Service: Cardiovascular;;  rt PDA  3.0/28mm Promus stent   RADIAL ARTERY HARVEST Left 08/15/2019   Procedure: Radial Artery Harvest;  Surgeon: [name], MD;  Location: [place];  Service: Open Heart Surgery;  Laterality: Left;   RIB PLATING N/A 09/06/2019   Procedure: STERNAL PLATING;  Surgeon: Grace Riddick, MD;  Location: [place];  Service: Thoracic;  Laterality: N/A;   TEE WITHOUT CARDIOVERSION N/A 08/15/2019   Procedure: TRANSESOPHAGEAL ECHOCARDIOGRAM (TEE);  Surgeon: [name],  MD;  Location: [place];  Service: Open Heart Surgery;  Laterality: N/A;   TRACHEOSTOMY TUBE PLACEMENT  08/29/2019   Procedure: Tracheostomy;  Surgeon: [name], MD;  Location: [facility];  Service: Open Heart Surgery; Decannulated 6/10/20211   TRANSURETHRAL RESECTION OF PROSTATE  04/04/2012   Procedure: TRANSURETHRAL RESECTION OF THE PROSTATE WITH GYRUS INSTRUMENTS;  Surgeon: Ailene Rud, MD;  Location: [facility];  Service: Urology;  Laterality: N/A;   TRANSURETHRAL RESECTION OF PROSTATE N/A 09/27/2014   Procedure: TRANSURETHRAL RESECTION OF THE PROSTATE ;  Surgeon: Carolan Clines, MD;  Location[facility];  Service: Urology;  Laterality: N/A;   UPPER GASTROINTESTINAL ENDOSCOPY     Family History  Problem Relation Age of Onset   Diabetes Mother    Hypertension Mother    Diabetes Father    Diabetes Brother    Hypertension Brother    Bone cancer Brother    Diabetes Brother    Social History   Tobacco Use   Smoking status: Former    Packs/day: 0.50    Years: 20.00    Pack years: 10.00    Types: Cigarettes    Quit date: 1975    Years since quitting: 47.8   Smokeless tobacco: Never  Substance Use Topics   Alcohol use: Not Currently    Comment: very rarely - every now and then 1 drink/1 year if that   ROS   Review of Systems  Cardiovascular:  Negative for chest pain, claudication, leg swelling, near-syncope, orthopnea, palpitations, paroxysmal nocturnal dyspnea and syncope.  Respiratory:  Positive for shortness of breath.   Musculoskeletal:  Positive for joint pain.  Neurological:  Positive for disturbances in coordination (left sided weakness) and loss of balance.  Objective  Blood pressure 129/62, pulse 78, temperature 98.5 F (36.9 C), temperature source Temporal, resp. rate 17, height 5\' 7"  (1.702 m), weight 209 lb 12.8 oz (95.2 kg), SpO2 95 %.  Vitals with BMI 02/24/2021 01/29/2021 01/29/2021  Height 5\' 7"  - 5\' 7"   Weight 209 lbs 13 oz - 212 lbs   BMI 91.47 - 82.9  Systolic 562 130 865  Diastolic 62 70 75  Pulse 78 89 86    Physical Exam Vitals reviewed.  Constitutional:      Comments: He is moderately built and mildly obese in no acute distress.  Neck:     Thyroid: No thyromegaly.  Cardiovascular:     Rate and Rhythm: Normal rate and regular rhythm.     Pulses:  Carotid pulses are 2+ on the right side and 2+ on the left side.      Femoral pulses are 2+ on the right side and 2+ on the left side.      Popliteal pulses are 0 on the right side and 0 on the left side.       Dorsalis pedis pulses are 0 on the right side and 0 on the left side.       Posterior tibial pulses are 0 on the right side and 0 on the left side.     Heart sounds: Normal heart sounds, S1 normal and S2 normal. No murmur heard.   No gallop.     Comments: No JVD Pulmonary:     Effort: No respiratory distress.     Breath sounds: No wheezing, rhonchi or rales.     Comments: Mildly dyspneic while talking during exam Chest:     Comments: Sternotomy scar has healed well Musculoskeletal:     Right lower leg: Edema (minimal) present.     Left lower leg: Edema (minimal) present.  Neurological:     Mental Status: He is alert.    Laboratory examination:   Recent Labs    12/14/20 0124 12/15/20 0045 12/16/20 0022  NA 135 138 139  K 3.9 3.6 3.9  CL 103 106 105  CO2 23 25 23   GLUCOSE 98 204* 127*  BUN 43* 40* 38*  CREATININE 2.69* 2.76* 2.52*  CALCIUM 8.4* 8.6* 8.9  GFRNONAA 24* 24* 26*   CrCl cannot be calculated (Patient's most recent lab result is older than the maximum 21 days allowed.).  CMP Latest Ref Rng & Units 12/16/2020 12/15/2020 12/14/2020  Glucose 70 - 99 mg/dL 127(H) 204(H) 98  BUN 8 - 23 mg/dL 38(H) 40(H) 43(H)  Creatinine 0.61 - 1.24 mg/dL 2.52(H) 2.76(H) 2.69(H)  Sodium 135 - 145 mmol/L 139 138 135  Potassium 3.5 - 5.1 mmol/L 3.9 3.6 3.9  Chloride 98 - 111 mmol/L 105 106 103  CO2 22 - 32 mmol/L 23 25 23   Calcium 8.9 - 10.3  mg/dL 8.9 8.6(L) 8.4(L)  Total Protein 6.5 - 8.1 g/dL - - -  Total Bilirubin 0.3 - 1.2 mg/dL - - -  Alkaline Phos 38 - 126 U/L - - -  AST 15 - 41 U/L - - -  ALT 0 - 44 U/L - - -   CBC Latest Ref Rng & Units 12/12/2020 12/11/2020 10/31/2020  WBC 4.0 - 10.5 K/uL 8.4 9.2 -  Hemoglobin 13.0 - 17.0 g/dL 11.7(L) 10.8(L) 11.2(L)  Hematocrit 39.0 - 52.0 % 38.1(L) 35.9(L) 33.0(L)  Platelets 150 - 400 K/uL 218 203 -   Lipid Panel No results for input(s): CHOL, TRIG, LDLCALC, VLDL, HDL, CHOLHDL, LDLDIRECT in the last 8760 hours.  HEMOGLOBIN A1C Lab Results  Component Value Date   HGBA1C 5.4 03/25/2020   MPG 94 12/20/2019   TSH Recent Labs    05/28/20 1538  TSH 1.24   BNP    Component Value Date/Time   BNP 126.5 (H) 01/31/2021 1040   BNP 184.8 (H) 12/11/2020 1533   ProBNP    Component Value Date/Time   PROBNP 64.0 05/28/2020 1538   Allergies  No Known Allergies   Medications Prior to Visit:   Outpatient Medications Prior to Visit  Medication Sig Dispense Refill   albuterol (VENTOLIN HFA) 108 (90 Base) MCG/ACT inhaler Inhale 2 puffs into the lungs every 6 (six) hours as needed for wheezing or shortness of breath. 8  g 2   aspirin EC 81 MG tablet Take 81 mg by mouth daily.     atorvastatin (LIPITOR) 20 MG tablet Take 1 tablet (20 mg total) by mouth every evening. 90 tablet 3   clonazePAM (KLONOPIN) 0.5 MG disintegrating tablet TAKE 1 TABLET BY MOUTH TWICE A DAY (Patient taking differently: Take 0.5 mg by mouth 2 (two) times daily.) 60 tablet 5   clopidogrel (PLAVIX) 75 MG tablet TAKE 1 TABLET BY MOUTH EVERY DAY (Patient taking differently: Take 75 mg by mouth daily.) 90 tablet 1   cromolyn (OPTICROM) 4 % ophthalmic solution Place 1 drop into both eyes every Wednesday. At bedtime     [employer] ACETAMINOPHEN 325 MG tablet TAKE 2 CAPS BY MOUTH EVERY 6 HOURS AS NEEDED FOR PAIN FOR TEMP 100F OR ABOVE (Patient taking differently: Take 325 mg by mouth every 6 (six) hours as needed for mild pain or  headache (temp over 100).) 60 tablet 0   [employer] SENNA PLUS 8.6-50 MG tablet TAKE 2 TABLETS BY MOUTH AT BEDTIME FOR CONSTIPATION (Patient taking differently: Take 1 tablet by mouth at bedtime.) 60 tablet 1   ferrous sulfate 325 (65 FE) MG tablet Take 325 mg by mouth daily with breakfast.     furosemide (LASIX) 80 MG tablet Take 120 mg by mouth 2 (two) times daily.     hydrALAZINE (APRESOLINE) 100 MG tablet Take 1 tablet (100 mg total) by mouth 3 (three) times daily. 270 tablet 3   isosorbide dinitrate (ISORDIL) 30 MG tablet TAKE 1 TABLET BY MOUTH THREE TIMES A DAY (Patient taking differently: Take 30 mg by mouth 2 (two) times daily.) 270 tablet 1   metoprolol succinate (TOPROL-XL) 50 MG 24 hr tablet TAKE 3 TABLETS BY MOUTH EVERY DAY 90 tablet 1   multivitamin (RENA-VIT) TABS tablet Take 1 tablet by mouth at bedtime.  0   nitroGLYCERIN (NITROSTAT) 0.4 MG SL tablet Place 0.4 mg under the tongue every 5 (five) minutes as needed for chest pain.     oxyCODONE-acetaminophen (PERCOCET) 5-325 MG tablet Take 1 tablet by mouth every 6 (six) hours as needed for severe pain. 20 tablet 0   potassium chloride (KLOR-CON) 10 MEQ tablet Take 10 mEq by mouth daily.     PREVIDENT 5000 BOOSTER PLUS 1.1 % PSTE Take 1 application by mouth every evening.     sertraline (ZOLOFT) 25 MG tablet TAKE 1 TABLET ONE TIME A DAY 90 tablet 1   silodosin (RAPAFLO) 8 MG CAPS capsule TAKE 1 CAPSULE BY MOUTH EVERYDAY AT BEDTIME (Patient taking differently: Take 8 mg by mouth at bedtime.) 90 capsule 3   Vitamin D, Ergocalciferol, (DRISDOL) 1.25 MG (50000 UNIT) CAPS capsule Take 1 capsule (50,000 Units total) by mouth every 7 (seven) days. (Patient taking differently: Take 50,000 Units by mouth every Wednesday.) 12 capsule 3   DM-APAP-CPM (CORICIDIN HBP PO) Take 1 tablet by mouth every 6 (six) hours as needed (congestion/cough).     No facility-administered medications prior to visit.   Final Medications at End of Visit    Current Meds   Medication Sig   albuterol (VENTOLIN HFA) 108 (90 Base) MCG/ACT inhaler Inhale 2 puffs into the lungs every 6 (six) hours as needed for wheezing or shortness of breath.   aspirin EC 81 MG tablet Take 81 mg by mouth daily.   atorvastatin (LIPITOR) 20 MG tablet Take 1 tablet (20 mg total) by mouth every evening.   clonazePAM (KLONOPIN) 0.5 MG disintegrating tablet TAKE 1 TABLET  BY MOUTH TWICE A DAY (Patient taking differently: Take 0.5 mg by mouth 2 (two) times daily.)   clopidogrel (PLAVIX) 75 MG tablet TAKE 1 TABLET BY MOUTH EVERY DAY (Patient taking differently: Take 75 mg by mouth daily.)   cromolyn (OPTICROM) 4 % ophthalmic solution Place 1 drop into both eyes every Wednesday. At bedtime   [employer]S ACETAMINOPHEN 325 MG tablet TAKE 2 CAPS BY MOUTH EVERY 6 HOURS AS NEEDED FOR PAIN FOR TEMP 100F OR ABOVE (Patient taking differently: Take 325 mg by mouth every 6 (six) hours as needed for mild pain or headache (temp over 100).)   [employer]S SENNA PLUS 8.6-50 MG tablet TAKE 2 TABLETS BY MOUTH AT BEDTIME FOR CONSTIPATION (Patient taking differently: Take 1 tablet by mouth at bedtime.)   ferrous sulfate 325 (65 FE) MG tablet Take 325 mg by mouth daily with breakfast.   furosemide (LASIX) 80 MG tablet Take 120 mg by mouth 2 (two) times daily.   hydrALAZINE (APRESOLINE) 100 MG tablet Take 1 tablet (100 mg total) by mouth 3 (three) times daily.   isosorbide dinitrate (ISORDIL) 30 MG tablet TAKE 1 TABLET BY MOUTH THREE TIMES A DAY (Patient taking differently: Take 30 mg by mouth 2 (two) times daily.)   metoprolol succinate (TOPROL-XL) 50 MG 24 hr tablet TAKE 3 TABLETS BY MOUTH EVERY DAY   multivitamin (RENA-VIT) TABS tablet Take 1 tablet by mouth at bedtime.   nitroGLYCERIN (NITROSTAT) 0.4 MG SL tablet Place 0.4 mg under the tongue every 5 (five) minutes as needed for chest pain.   oxyCODONE-acetaminophen (PERCOCET) 5-325 MG tablet Take 1 tablet by mouth every 6 (six) hours as needed for severe pain.   potassium  chloride (KLOR-CON) 10 MEQ tablet Take 10 mEq by mouth daily.   PREVIDENT 5000 BOOSTER PLUS 1.1 % PSTE Take 1 application by mouth every evening.   sertraline (ZOLOFT) 25 MG tablet TAKE 1 TABLET ONE TIME A DAY   silodosin (RAPAFLO) 8 MG CAPS capsule TAKE 1 CAPSULE BY MOUTH EVERYDAY AT BEDTIME (Patient taking differently: Take 8 mg by mouth at bedtime.)   Vitamin D, Ergocalciferol, (DRISDOL) 1.25 MG (50000 UNIT) CAPS capsule Take 1 capsule (50,000 Units total) by mouth every 7 (seven) days. (Patient taking differently: Take 50,000 Units by mouth every Wednesday.)    Radiology:  No results found.  Cardiac Studies:   Heart catheterization 2019-09-07: LM: Normal LAD: Ostial 95% stenosis Ramus: Ostial/prox 30% stenosis LCx: Ostial 75% stenosis RCA: Prox-mid diffuse 40% calcific disease. Distal RCA 70% ISR of 3.0 by 28 mm Promus placed 02/27/2014.  Pre-CABG Dopplers including ABI and carotid duplex: No significant carotid artery stenosis.  Antegrade vertebral artery flow. ABI normal, with triphasic waveforms at the level of the ankle.  Hx of CABG 08/15/2019: LIMA TO LAD, RIMA TO PDA, SEQUENTIAL RADIAL ARTERY TO Cx-OM1.  Echocardiogram 07/22/2020: Left ventricle cavity is normal in size. Mild concentric hypertrophy of the left ventricle. Normal global wall motion. Normal LV systolic function with EF 67%. Normal diastolic filling pattern.  Trileaflet aortic valve with  Mild aortic valve leaflet calcification. Trace aortic valve stenosis. No regurgitation. Normal right atrial pressure.  Echocardiogram 12/12/2020:  1. Left ventricular ejection fraction, by estimation, is 45 to 50%. The  left ventricle has low normal function. Left ventricular endocardial  border not optimally defined to evaluate regional wall motion. Left  ventricular diastolic function could not be  evaluated. There is mild of the left ventricular, inferolateral wall.   2. Right ventricular systolic function is  normal. The  right ventricular  size is normal.   3. Left atrial size was mildly dilated.   4. The mitral valve is normal in structure. Trivial mitral valve  regurgitation.   5. The aortic valve is tricuspid. Aortic valve regurgitation is not  visualized. Mild to moderate aortic valve sclerosis/calcification is  present, without any evidence of aortic stenosis.   6. The inferior vena cava is normal in size with greater than 50%  respiratory variability, suggesting right atrial pressure of 3 mmHg.   Bilateral lower extremity DVT study 12/12/2020: No evidence of DVT  PCV MYOCARDIAL PERFUSION WO LEXISCAN 01/20/2021 Lexiscan nuclear stress test performed using 1-day protocol. Normal myocardial perfusion. Stress LVEF calculated 49%, although visually appears normal. Low risk study.  EKG:   12/18/2020: sinus rhythm with PVCs at a rate of 96 bpm.  Normal axis.  Incomplete right bundle branch block.  Poor R wave progression, cannot exclude anteroseptal infarct old.  Compared to EKG 07/11/2020, no significant change.  12/11/2020: Sinus rhythm at a rate of 88 bpm.  Left axis, incomplete right bundle branch block.  Poor R wave progression, cannot exclude anteroseptal infarct old.  Unchanged compared to EKG 07/11/2020.  Assessment     ICD-10-CM   1. Chronic combined systolic and diastolic heart failure (HCC)  I50.42     2. Dyspnea on exertion  R06.09     3. Shortness of breath  R06.02 Ambulatory referral to Pulmonology      No orders of the defined types were placed in this encounter.  Medications Discontinued During This Encounter  Medication Reason   DM-APAP-CPM (CORICIDIN HBP PO) Error     Orders Placed This Encounter  Procedures   Ambulatory referral to Pulmonology    Referral Priority:   Routine    Referral Type:   Consultation    Referral Reason:   Specialty Services Required    Requested Specialty:   Pulmonary Disease    Number of Visits Requested:   1    Recommendations:   Christian [name]  is a 75 y.o. AA male with hypertension, hyperlipidemia, DM with stage 3 CKD,  CAD with  stenting to the PDA on 02/27/2014. Patient presented with unstable angina and NSTEMI on 08/09/2019 along with a very small subsegmental pulmonary embolism and discharged on 10/18/2019 after he underwent CABG x4 on 08/15/2019.    Echocardiogram 11/2020 revealed mildly reduced LVEF at 45-50%, therefore patient underwent stress test which was overall low risk.  Last office visit patient complained of dyspnea, therefore obtained BNP which was mildly elevated.  He now presents for follow-up.  Patient's BNP was only minimally elevated and he is not overtly volume overloaded on exam.  Given that albuterol inhaler seems to improve patient's shortness of breath symptoms feel it is appropriate for pulmonary referral at this time.  Will refer to pulmonology for further evaluation of dyspnea.  Suspect patient's shortness of breath is multifactorial including underlying CAD and mildly reduced LVEF.  Follow-up as previously scheduled.   Alethia Berthold, PA-C 02/24/2021, 4:46 PM Office: [phone]

## 2021-03-03 ENCOUNTER — Ambulatory Visit: Payer: Federal, State, Local not specified - PPO | Admitting: Cardiology

## 2021-03-22 ENCOUNTER — Other Ambulatory Visit: Payer: Self-pay | Admitting: Student

## 2021-03-23 ENCOUNTER — Other Ambulatory Visit: Payer: Self-pay | Admitting: Internal Medicine

## 2021-03-27 ENCOUNTER — Telehealth: Payer: Self-pay | Admitting: Internal Medicine

## 2021-03-27 NOTE — Telephone Encounter (Signed)
1.Medication Requested: albuterol (VENTOLIN HFA) 108 (90 Base) MCG/ACT inhaler  2. Pharmacy (Name, [place], [place]): [id number] - [place], [place] - [place]  Phone:  [phone] Fax:  [phone]   3. On Med List: yes  4. Last Visit with PCP: 08.29.22  5. Next visit date with PCP: n/a   Agent: Please be advised that RX refills may take up to 3 business days. We ask that you follow-up with your pharmacy.

## 2021-03-28 MED ORDER — ALBUTEROL SULFATE HFA 108 (90 BASE) MCG/ACT IN AERS: 2.0000 | INHALATION_SPRAY | Freq: Four times a day (QID) | RESPIRATORY_TRACT | 0 refills | Status: DC | PRN

## 2021-04-17 ENCOUNTER — Other Ambulatory Visit: Payer: Self-pay | Admitting: Student

## 2021-04-21 ENCOUNTER — Other Ambulatory Visit: Payer: Self-pay | Admitting: Internal Medicine

## 2021-04-30 ENCOUNTER — Ambulatory Visit (INDEPENDENT_AMBULATORY_CARE_PROVIDER_SITE_OTHER): Payer: Medicare Other

## 2021-04-30 DIAGNOSIS — E1169 Type 2 diabetes mellitus with other specified complication: Secondary | ICD-10-CM

## 2021-04-30 DIAGNOSIS — N184 Chronic kidney disease, stage 4 (severe): Secondary | ICD-10-CM

## 2021-04-30 DIAGNOSIS — E785 Hyperlipidemia, unspecified: Secondary | ICD-10-CM

## 2021-04-30 DIAGNOSIS — I10 Essential (primary) hypertension: Secondary | ICD-10-CM

## 2021-04-30 NOTE — Patient Instructions (Signed)
Visit Information  Following are the goals we discussed today:   Track and Manage Fluids and Swelling - Heart Failure   Timeframe:  Long-Range Goal Priority:  High Start Date:   11/06/20                          Expected End Date:    04/30/2022             Follow Up Date April 2023   - call office if I gain more than 2 pounds in one day or 5 pounds in one week - keep legs up while sitting - track weight in diary - use salt in moderation - watch for swelling in feet, ankles and legs every day - weigh myself daily    Why is this important?   It is important to check your weight daily and watch how much salt and liquids you have.  It will help you to manage your heart failure.    Notes:   Plan: Telephone follow up appointment with care management team member scheduled for:  3 months  The patient has been provided with contact information for the care management team and has been advised to call with any health related questions or concerns.   Tomasa Blase, PharmD Clinical Pharmacist, [facility]   Please call the care guide team at [phone] if you need to cancel or reschedule your appointment.   The patient verbalized understanding of instructions, educational materials, and care plan provided today and declined offer to receive copy of patient instructions, educational materials, and care plan.

## 2021-04-30 NOTE — Progress Notes (Signed)
Chronic Care Management Pharmacy Note  04/30/2021 Name:  Christian [name] MRN:  [id number] DOB:  1945-05-28  Summary: -Patient confirms compliance to current medications -Blood pressure when checked at home has been in range -Has follow up with nephrology and cardiology at the end of the month -Establishing with pulmonology later this month   Recommendations/Changes made from today's visit: -Recommended for patient to make follow up PCP appointment  -Patient counseled on need for updated COVID vaccine and Shingles vaccine as well   Subjective: Christian [name] is an 76 y.o. year old male who is a primary patient of Hoyt Koch, MD.  The CCM team was consulted for assistance with disease management and care coordination needs.    Engaged with patient by telephone for follow up visit in response to provider referral for pharmacy case management and/or care coordination services.   Consent to Services:  The patient was given information about Chronic Care Management services, agreed to services, and gave verbal consent prior to initiation of services.  Please see initial visit note for detailed documentation.   Patient Care Team: Hoyt Koch, MD as PCP - General (Internal Medicine) Gardiner Barefoot, DPM as Consulting Physician (Podiatry) McKenzie, Candee Furbish, MD as Consulting Physician (Urology) Woody Seller Sylvan Cheese, MD as Referring Physician (Cardiology) Abem Shaddix, Darnelle Maffucci, [facility] as Pharmacist (Pharmacist)   Patient lives at home with wife who handles his medications. She sets up pill box for him and keeps track of changes on med list. Patient is currently getting help from home health nurse 3 days a week but this service will end soon per patient. He is interested in getting into [facility] for supervised physical therapy and exercise.   Recent office visits: 12/23/20 Dr Sharlet Salina OV: hospital f/u (CAP): BP at goal; CXR stable w/ fluid  06/28/20 Dr Sharlet Salina  OV: chronic f/u; pt gained 30 lbs since last month w/ appetite stimulation from mirtazapine. He slept a lot better. Decided to d/c mirtazapine, may consider restarting lower dose if sleep patterns worsen. Counseled DASH diet and low sodium diet.  05/28/20 Dr Sharlet Salina OV: f/u fatigue; rx'd mirtazapine; dc'd tamsulosin, Rapaflo, Renvela.  Recent consult visits: 02/24/2021 - Lawerance Cruel PA-C - Cardiology - referred to pulmonology for further evaluation of dyspnea  02/14/2021 - Dr. Elisha Ponder - Podiatry  - follow up in 3 months   Hospital visits: Medication Reconciliation was completed by comparing discharge summary, patients EMR and Pharmacy list, and upon discussion with patient.  Admitted to the hospital on 12/11/20 due to CAP, Pleural effusion. Discharge date was 12/16/20. Discharged from [facility].   -Completed ceftriaxone and azithro x 5 days -rec'd OP stress test  Medication Changes at Hospital Discharge: -Changed furosemide to 80 mg BID  Medications that remain the same after Hospital Discharge:??  -All other medications will remain the same.    10/31/20 admission for vein transposition.  Admitted to the hospital on 10/03/20 due to CHF exacerbation Discharge date was 10/07/20. Discharged from [facility]?Medications Started at [facility] Discharge:?? -started potassium 40 meq x 15 days  -clopidogrel 75 mg (restarted)  Medication Changes at Hospital Discharge: -Changed furosemide to 120 mg BID for diuresis -Reduced metoprolol to 150 mg given volume overload  Medications Discontinued at Hospital Discharge: -Stopped oxycodone  Medications that remain the same after Hospital Discharge:??  -All other medications will remain the same.    09/18/20 admission to Vascular for AV fistula.  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  Objective:  Lab Results  Component Value Date   CREATININE 2.52 (H) 12/16/2020   BUN 38 (H) 12/16/2020   GFR 17.66 (L) 05/28/2020   GFRNONAA 26 (L) 12/16/2020   GFRAA 19 (L) 10/18/2019   NA 139 12/16/2020   K 3.9 12/16/2020   CALCIUM 8.9 12/16/2020   CO2 23 12/16/2020   GLUCOSE 127 (H) 12/16/2020    Lab Results  Component Value Date/Time   HGBA1C 5.4 03/25/2020 11:55 AM   HGBA1C 4.9 12/20/2019 01:12 PM   GFR 17.66 (L) 05/28/2020 03:38 PM   GFR 18.69 (L) 03/25/2020 11:55 AM    Last diabetic Eye exam:  Lab Results  Component Value Date/Time   HMDIABEYEEXA No Retinopathy 05/30/2020 10:25 AM    Last diabetic Foot exam: No results found for: HMDIABFOOTEX   Lab Results  Component Value Date   CHOL 107 08/11/2019   HDL 42 08/11/2019   LDLCALC 40 08/11/2019   LDLDIRECT 40.0 11/23/2018   TRIG 71 08/27/2019   CHOLHDL 2.5 08/11/2019    Hepatic Function Latest Ref Rng & Units 10/04/2020 05/28/2020 03/25/2020  Total Protein 6.5 - 8.1 g/dL 7.0 8.0 -  Albumin 3.5 - 5.0 g/dL 3.8 4.1 4.1  AST 15 - 41 U/L 32 32 -  ALT 0 - 44 U/L 52(H) 28 -  Alk Phosphatase 38 - 126 U/L 80 80 -  Total Bilirubin 0.3 - 1.2 mg/dL 0.5 0.3 -    Lab Results  Component Value Date/Time   TSH 1.24 05/28/2020 03:38 PM   TSH 2.590 04/19/2019 09:11 AM    CBC Latest Ref Rng & Units 12/12/2020 12/11/2020 10/31/2020  WBC 4.0 - 10.5 K/uL 8.4 9.2 -  Hemoglobin 13.0 - 17.0 g/dL 11.7(L) 10.8(L) 11.2(L)  Hematocrit 39.0 - 52.0 % 38.1(L) 35.9(L) 33.0(L)  Platelets 150 - 400 K/uL 218 203 -    Lab Results  Component Value Date/Time   VD25OH 58.52 05/28/2020 03:38 PM   VD25OH 95.53 10/19/2017 11:10 AM    Clinical ASCVD: Yes  The ASCVD Risk score (Arnett DK, et al., 2019) failed to calculate for the following reasons:   The patient has a prior MI or stroke diagnosis    Depression screen [facility] 2/9 09/12/2020 09/12/2020 06/25/2020  Decreased Interest 0 0 0  Down, Depressed, Hopeless 0 0 0  PHQ - 2  Score 0 0 0  Some recent data might be hidden     Social History   Tobacco Use  Smoking Status Former   Packs/day: 0.50   Years: 20.00   Pack years: 10.00   Types: Cigarettes   Quit date: 1975   Years since quitting: 48.0  Smokeless Tobacco Never   BP Readings from Last 3 Encounters:  02/24/21 129/62  01/29/21 129/70  12/23/20 134/80   Pulse Readings from Last 3 Encounters:  02/24/21 78  01/29/21 89  12/23/20 (!) 102   Wt Readings from Last 3 Encounters:  02/24/21 209 lb 12.8 oz (95.2 kg)  01/29/21 212 lb (96.2 kg)  12/23/20 214 lb 6.4 oz (97.3 kg)   BMI Readings from Last 3 Encounters:  02/24/21 32.86 kg/m  01/29/21 33.20 kg/m  12/23/20 33.58 kg/m    Assessment/Interventions: Review of patient past medical history, allergies, medications, health status, including review of consultants reports, laboratory and other test data, was performed as part of comprehensive evaluation  and provision of chronic care management services.   SDOH:  (Social Determinants of Health) assessments and interventions performed: Yes  SDOH Screenings   Alcohol Screen: Not on file  Depression (PHQ2-9): Low Risk    PHQ-2 Score: 0  Financial Resource Strain: Not on file  Food Insecurity: Not on file  Housing: Not on file  Physical Activity: Not on file  Social Connections: Not on file  Stress: Not on file  Tobacco Use: Medium Risk   Smoking Tobacco Use: Former   Smokeless Tobacco Use: Never   Passive Exposure: Not on file  Transportation Needs: Not on file    [place]  No Known Allergies  Medications Reviewed Today     Reviewed by Tomasa Blase, [facility] (Pharmacist) on 04/30/21 at 51  Med List Status: <None>   Medication Order Taking? Sig Documenting Provider Last Dose Status Informant  albuterol (VENTOLIN HFA) 108 (90 Base) MCG/ACT inhaler [id number] Yes TAKE 2 PUFFS BY MOUTH EVERY 6 HOURS AS NEEDED FOR WHEEZE OR SHORTNESS OF [place] Brunette, MD Taking  Active   aspirin EC 81 MG tablet [id number] Yes Take 81 mg by mouth daily. [provider] Taking Active Multiple Informants  atorvastatin (LIPITOR) 20 MG tablet [id number] Yes Take 1 tablet (20 mg total) by mouth every evening. Adrian Prows, MD Taking Active Multiple Informants  clonazePAM (KLONOPIN) 0.5 MG disintegrating tablet [id number] Yes TAKE 1 TABLET BY MOUTH TWICE A DAY Biagio Borg, MD Taking Active   clopidogrel (PLAVIX) 75 MG tablet [id number] Yes TAKE 1 TABLET BY MOUTH EVERY DAY  Patient taking differently: Take 75 mg by mouth daily.   Adrian Prows, MD Taking Active   cromolyn (OPTICROM) 4 % ophthalmic solution [id number] Yes Place 1 drop into both eyes every Wednesday. At bedtime [provider] Taking Active Multiple Informants  [facility] ACETAMINOPHEN 325 MG tablet [id number] Yes TAKE 2 CAPS BY MOUTH EVERY 6 HOURS AS NEEDED FOR PAIN FOR TEMP 100F OR ABOVE  Patient taking differently: Take 325 mg by mouth every 6 (six) hours as needed for mild pain or headache (temp over 100).   Hoyt Koch, MD Taking Active   [facility] SENNA PLUS 8.6-50 MG tablet [id number] Yes TAKE 2 TABLETS BY MOUTH AT BEDTIME FOR CONSTIPATION Hoyt Koch, MD Taking Active   ferrous sulfate 325 (65 FE) MG tablet [id number] Yes Take 325 mg by mouth daily with breakfast. [provider] Taking Active Multiple Informants           Med Note Maryjean Morn, RICARDO K   Wed Apr 24, 2020  2:09 PM)    furosemide (LASIX) 80 MG tablet [id number] Yes Take 120 mg by mouth 2 (two) times daily. [provider] Taking Active Multiple Informants  hydrALAZINE (APRESOLINE) 100 MG tablet [id number] Yes Take 1 tablet (100 mg total) by mouth 3 (three) times daily. Cantwell, Celeste C, PA-C Taking Active   isosorbide dinitrate (ISORDIL) 30 MG tablet [id number] Yes TAKE 1 TABLET BY MOUTH THREE TIMES A DAY  Patient taking differently: Take 30 mg by mouth 2 (two) times daily.   Cantwell, Celeste C, PA-C Taking Active    metoprolol succinate (TOPROL-XL) 50 MG 24 hr tablet [id number] Yes TAKE 3 TABLETS BY MOUTH EVERY DAY Cantwell, Celeste C, PA-C Taking Active   Multiple Vitamin (MULTIVITAMIN) tablet [id number] Yes Take 1 tablet by mouth daily. [provider] Taking Active   nitroGLYCERIN (NITROSTAT) 0.4 MG SL tablet [id number] No Place 0.4 mg under the tongue  every 5 (five) minutes as needed for chest pain.  Patient not taking: Reported on 04/30/2021   [provider] Not Taking Active Multiple Informants  oxyCODONE-acetaminophen (PERCOCET) 5-325 MG tablet [id number]  Take 1 tablet by mouth every 6 (six) hours as needed for severe pain. Ulyses Amor, PA-C  Consider Medication Status and Discontinue (Completed Course) Multiple Informants  potassium chloride (KLOR-CON) 10 MEQ tablet [id number] Yes Take 10 mEq by mouth daily. [provider] Taking Active Multiple Informants  PREVIDENT 5000 BOOSTER PLUS 1.1 % PSTE [id number] Yes Take 1 application by mouth every evening. [provider] Taking Active Multiple Informants  sertraline (ZOLOFT) 25 MG tablet [id number] Yes TAKE 1 TABLET ONE TIME A DAY Hoyt Koch, MD Taking Active   silodosin (RAPAFLO) 8 MG CAPS capsule [id number] Yes TAKE 1 CAPSULE BY MOUTH EVERYDAY AT BEDTIME  Patient taking differently: Take 8 mg by mouth at bedtime.   McKenzie, Candee Furbish, MD Taking Active   Vitamin D, Ergocalciferol, (DRISDOL) 1.25 MG (50000 UNIT) CAPS capsule [id number] Yes Take 1 capsule (50,000 Units total) by mouth every 7 (seven) days.  Patient taking differently: Take 50,000 Units by mouth every Wednesday.   Hoyt Koch, MD Taking Active             Patient Active Problem List   Diagnosis Date Noted   Atypical pneumonia 12/12/2020   Pleural effusion 12/12/2020   Fluid overload 12/12/2020   Pulmonary edema 10/03/2020   Sleep difficulties 05/30/2020   Poor appetite 05/30/2020   Anemia due to blood loss 04/24/2020    CKD (chronic kidney disease) stage 4, GFR 15-29 ml/min (HCC) 03/27/2020   Aortic atherosclerosis ([place]) 03/27/2020   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   Pruritus, unspecified 11/28/2019   Unspecified protein-calorie malnutrition ([place]) 10/27/2019   Secondary hyperparathyroidism of renal origin ([place]) 10/17/2019   Coronary artery disease 08/15/2019   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  LIMA TO LAD, RIMA TO PDA, RADIAL ARTERY TO CIRC AND SEQUENTIALLY TO OM1. 08/15/2019   Ischemic cardiomyopathy    Swelling of lower extremity    History of coronary artery stent placement    Pulmonary embolism ([place]) 08/09/2019   Elevated liver enzymes 08/09/2019   Left leg weakness 11/23/2018   CAD (coronary artery disease) 07/30/2018   Other fatigue 10/19/2017   Routine general medical examination at a health care facility 08/30/2016   Diabetes mellitus with polyneuropathy ([place]) 10/10/2014   Benign prostatic hyperplasia 09/27/2014   Stable angina pectoris ([place]) 02/27/2014   S/P PTCA (percutaneous transluminal coronary angioplasty) 02/27/2014   Hyperlipidemia associated with type 2 diabetes mellitus ([place]) 11/12/2009   MULTIPLE SCLEROSIS 11/12/2009   HTN (hypertension) 11/12/2009   Shortness of breath 11/12/2009    Immunization History  Administered Date(s) Administered   Fluad Quad(high Dose 65+) 01/09/2019   Influenza, High Dose Seasonal PF 02/08/2017, 01/17/2018, 01/30/2020   Influenza,inj,Quad PF,6+ Mos 02/28/2014, 02/11/2015   Influenza-Unspecified 03/11/2017   PFIZER(Purple Top)SARS-COV-2 Vaccination 06/01/2019, 06/22/2019, 01/18/2020, 12/06/2020   Pneumococcal Conjugate-13 08/13/2015, 01/09/2019   Pneumococcal Polysaccharide-23 02/28/2014   Tdap 08/13/2015    Conditions to be addressed/monitored:  Hypertension, Hyperlipidemia, Diabetes, Heart Failure, Coronary Artery Disease, and Chronic Kidney Disease  Care Plan : [place]   Updates made by Tomasa Blase, RPH since 04/30/2021 12:00 AM     Problem: HTN, HLD, CAD, DM, CKD IV   Priority: High     Long-Range Goal:  Disease management   Start Date: 05/24/2020  Expected End Date: 04/30/2022  This Visit's Progress: On track  Recent Progress: On track  Priority: High  Note:   Current Barriers:  Unable to independently monitor therapeutic efficacy Difficult to self administer medications as prescribed  Pharmacist Clinical Goal(s):  Patient will achieve adherence to monitoring guidelines and medication adherence to achieve therapeutic efficacy through collaboration with PharmD and provider.   Interventions: 1:1 collaboration with Hoyt Koch, MD regarding development and update of comprehensive plan of care as evidenced by provider attestation and co-signature Inter-disciplinary care team collaboration (see longitudinal plan of care) Comprehensive medication review performed; medication list updated in electronic medical record  Hypertension / Heart Failure (BP goal <130/80) -Controlled - pt endorses compliances with medications as prescribed; he reports home BP readings are in range  -Current home readings: averaging 130/70's  -HF type: diastolic (EF 10-27% 05/5364) -Current treatment: Metoprolol succinate 50 mg  - 3 tab daily Furosemide 145m BID  Hydralazine 100 mg TID Isosorbide DN 30 mg BID Potassium 10 mEq daily -Current dietary habits: trying to limit salt -Current exercise habits: limited  -Educated on BP goals and benefits of medications for prevention of heart attack, stroke and kidney damage; Importance of home blood pressure monitoring; importance of limiting sodium to < 2000 mg/day -Recommended to continue current medication  Hyperlipidemia/ CAD: (LDL goal < 70) -Controlled -  Lab Results  Component Value Date   LDLCALC 40 08/11/2019  most recent LDL was at goal in 07/2019 (right after CABG); pt endorses compliance with statin and  clopidogrel;  -Hx CAD. CABG 44/4034w/ complicated post-op including cardiac arrest and acute renal failure requiring HD. -Current treatment: Atorvastatin 20 mg daily Clopidogrel 75 mg daily Aspirin 81 mg daily Isosorbide DN 30 mg TID Nitroglycerin 0.4 mg SL prn - has not had to use  -Educated on Cholesterol goals; Benefits of statin for ASCVD risk reduction; -Recommended to continue current medication -Repeat lipid panel due this year  Depression/Anxiety (Goal: manage symptoms) -Controlled - pt does not endorse issues with mental health currently -Current treatment: Sertraline 25 mg daily Clonazepam 0.5 mg BID -Medications previously tried/failed: mirtazapine (worked well for sleep, but appetite stimulation caused 25 lb wt gain in 1 month) -PHQ9: 0 (08/2020) -GAD7: not on file -Connected with PCP for mental health support -Recommended to continue current medication  CKD Stage 4 (Goal: prevent progression) -Not ideally controlled - pt follows with Dr FRoyce Macadamiaand is preparing for dialysis; he had AV fistula procedure in May 2021;  -Counseled on importance of managing BP and DM to prevent further kidney damage  Health Maintenance -Vaccine gaps: Shingrix, Covid booster -Advised to get covid booster and shingrix  Patient Goals/Self-Care Activities Patient will:  - take medications as prescribed - Check BP daily and record results - Weigh daily; if gaining 2+ lbs overnight or 5+ lbs in a week, contact cardiology       Medication Assistance: None required.  Patient affirms current coverage meets needs.  Compliance/Adherence/Medication fill history: Care Gaps: A1c (due 09/22/20) Colonoscopy (due 09/27/18)  Patient's preferred pharmacy is:  [id number] [id number] [id number]: [phone]: [phone] Uses pill box? Yes Pt endorses 100% compliance  We  discussed: Current pharmacy is preferred with insurance plan and patient is satisfied with pharmacy services Patient decided to: Continue current medication management strategy  Care Plan  and Follow Up Patient Decision:  Patient agrees to Care Plan and Follow-up.  Plan: Telephone follow up appointment with care management team member scheduled for:  3 months  Tomasa Blase, PharmD Clinical Pharmacist, [place]

## 2021-05-08 ENCOUNTER — Ambulatory Visit: Payer: Federal, State, Local not specified - PPO | Admitting: Student

## 2021-05-08 ENCOUNTER — Other Ambulatory Visit: Payer: Self-pay

## 2021-05-08 ENCOUNTER — Encounter: Payer: Self-pay | Admitting: Student

## 2021-05-08 VITALS — BP 129/73 | HR 97 | Temp 97.8°F | Ht 67.0 in | Wt 222.0 lb

## 2021-05-08 DIAGNOSIS — Z951 Presence of aortocoronary bypass graft: Secondary | ICD-10-CM

## 2021-05-08 DIAGNOSIS — I255 Ischemic cardiomyopathy: Secondary | ICD-10-CM

## 2021-05-08 DIAGNOSIS — I25118 Atherosclerotic heart disease of native coronary artery with other forms of angina pectoris: Secondary | ICD-10-CM

## 2021-05-08 DIAGNOSIS — I5042 Chronic combined systolic (congestive) and diastolic (congestive) heart failure: Secondary | ICD-10-CM

## 2021-05-08 NOTE — Progress Notes (Signed)
Primary Physician/Referring:  Hoyt Koch, MD  Patient ID: Christian [name], male    DOB: Oct 24, 1945, 76 y.o.   MRN: [id number]  Chief Complaint  Patient presents with   Hypertension   Follow-up   Hyperlipidemia   Dizziness   HPI:    Christian [name]  is a 76 y.o. AA male with hypertension, hyperlipidemia, DM with stage 3 CKD,  CAD with  stenting to the PDA on 02/27/2014. Patient presented with unstable angina and NSTEMI on 08/09/2019 along with a very small subsegmental pulmonary embolism and discharged on 10/18/2019 after he underwent CABG x4 on 08/15/2019.   He had extremely complex postop complications including cardiogenic shock, cardiac arrest, needing internal CPR in the ICU after opening the chest, A. Fib needing cardioversion, closure office chest wall, developed acute renal failure leading to need for permanent dialysis, right brain infarct with left hemiparesis, multiple blood transfusions.  He was also placed on dialysis however is now off of dialysis since 02/01/2020. Echocardiogram 11/2020 revealed mildly reduced LVEF at 45-50%, therefore patient underwent stress test which was overall low risk.    Patient presents for 14-month follow-up.  At last office visit patient continued to complain of dyspnea improved with albuterol inhaler, therefore referred him to pulmonology for further evaluation.  Patient is scheduled to see pulmonology later this month.  His primary concern today is that for the last 5 days he has been having nausea and diarrhea with frequent loose stools.  He also reports over the last 4 days he has noticed fatigue and dyspnea.  In the office today patient's blood pressure is soft.  Denies chest pain, palpitations, syncope, near syncope.  Denies orthopnea, PND.  Notably patient's diuretics are primarily managed by nephrology.  Past Medical History:  Diagnosis Date   Anemia    low iron   BPH (benign prostatic hypertrophy)    CHF (congestive heart  failure) (HCC)    Chronic kidney disease    Coronary artery disease    Dry eyes left   Hiatal hernia    Hx of CABG 08/15/2019: x 4 using bilateral IMAs and left radial artery .  LIMA TO LAD, RIMA TO PDA, RADIAL ARTERY TO CIRC AND SEQUENTIALLY TO OM1. 08/15/2019   Hyperlipidemia    Hypertension    Incomplete bladder emptying    Myocardial infarction ([place]) 08/09/2019   Nocturia    PE (pulmonary thromboembolism) ([place]) 08/09/2019   small RLL PE 08/09/19   Pneumonia    as a child   Pre-diabetes    Problems with swallowing pt states test at [facility] approx 2012 shows a gastric valve  dysfunction--  eats small bites and drink liquids slowly   SOB (shortness of breath) on exertion    Past Surgical History:  Procedure Laterality Date   APPLICATION OF WOUND VAC N/A 08/24/2019   Procedure: APPLICATION OF WOUND VAC;  Surgeon: [name], MD;  Location: [place];  Service: Thoracic;  Laterality: N/A;   APPLICATION OF WOUND VAC  08/29/2019   Procedure: Wound Vac change;  Surgeon: [name], MD;  Location: [place] [facility];  Service: Open Heart Surgery;;   APPLICATION OF WOUND VAC N/A 09/04/2019   Procedure: Application Of Wound Vac;  Surgeon: Grace Angela, MD;  Location: [place];  Service: Open Heart Surgery;  Laterality: N/A;   APPLICATION OF WOUND VAC N/A 09/06/2019   Procedure: APPLICATION OF ACELL, APPLICATION OF WOUND VAC USING PREVENA INCISIONAL  DRESSING;  Surgeon: Wallace Going,  DO;  Location: [place];  Service: Plastics;  Laterality: N/A;   AV FISTULA PLACEMENT Left 09/18/2020   Procedure: ARTERIOVENOUS (AV) FISTULA CREATION LEFT VERSUS GRAFT;  Surgeon: Serafina Mitchell, MD;  Location: [place];  Service: Vascular;  Laterality: Left;   [place] Left 10/31/2020   Procedure: LEFT SECOND STAGE [place];  Surgeon: Serafina Mitchell, MD;  Location: [facility];  Service: Vascular;  Laterality: Left;   CARDIAC CATHETERIZATION     CORONARY ARTERY BYPASS GRAFT N/A  08/15/2019   Procedure: CORONARY ARTERY BYPASS GRAFTING (CABG), x 4 using bilateral IMAs and left radial artery .  LIMA TO LAD, RIMA TO PDA, RADIAL ARTERY TO CIRC AND SEQUENTIALLY TO OM1.;  Surgeon: [name], MD;  Location: [place];  Service: Open Heart Surgery;  Laterality: N/A;   CORONARY STENT PLACEMENT  02/27/2014   distal rt/pd coronary       dr Einar Gip   CYSTO/ BLADDER BIOPSY'S/ CAUTHERIZATION  01-14-2004  DR Gaynelle Arabian   EXPLORATION POST OPERATIVE OPEN HEART N/A 08/16/2019   Procedure: Chest Closure S?P CABG WITH APPLICATION OF PREVENA  INCISIONAL WOUND VAC;  Surgeon: [name], MD;  Location: [place];  Service: Open Heart Surgery;  Laterality: N/A;   EXPLORATION POST OPERATIVE OPEN HEART N/A 08/21/2019   Procedure: CHEST WASHOUT S/P OPEN CHEST;  Surgeon: [name], MD;  Location: [place];  Service: Open Heart Surgery;  Laterality: N/A;  Open chest with Esmark dressing with Ioban sealant coverage.   EXPLORATION POST OPERATIVE OPEN HEART N/A 08/18/2019   Procedure: EXPLORATION POST OPERATIVE OPEN HEART (performed 04/23 on unit);  Surgeon: [name], MD;  Location: [place];  Service: Open Heart Surgery;  Laterality: N/A;   EXPLORATION POST OPERATIVE OPEN HEART N/A 08/24/2019   Procedure: CHEST WASHOUT POST OPERATIVE OPEN HEART;  Surgeon: [name], MD;  Location: [place];  Service: Open Heart Surgery;  Laterality: N/A;   EXPLORATION POST OPERATIVE OPEN HEART N/A 08/29/2019   Procedure: CHEST WOUND WASHOUT POST OPERATIVE OPEN HEART;  Surgeon: [name], MD;  Location: [place];  Service: Open Heart Surgery;  Laterality: N/A;   EXPLORATION POST OPERATIVE OPEN HEART N/A 09/04/2019   Procedure: MEDIASTINAL EXPLORATION WITH STERNAL WOUND IRRIGATION;  Surgeon: Grace Contrell, MD;  Location: [place];  Service: Open Heart Surgery;  Laterality: N/A;   EXPLORATION POST OPERATIVE OPEN HEART N/A 09/14/2019   Procedure: EVACUATION OF HEMATOMA;  Surgeon: Grace Rustin, MD;   Location: [place];  Service: Open Heart Surgery;  Laterality: N/A;   IR FLUORO GUIDE CV LINE LEFT  09/19/2019   IR GASTROSTOMY TUBE MOD SED  10/04/2019   IR GASTROSTOMY TUBE REMOVAL  11/29/2019   IR PATIENT EVAL TECH 0-60 MINS  09/29/2019   IR US GUIDE VASC ACCESS LEFT  09/19/2019   LAPAROSCOPIC LYSIS OF ADHESIONS N/A 09/06/2019   Procedure: LAPAROSCOPIC OMENTAL HARVEST;  Surgeon: Mickeal Skinner, MD;  Location: [place];  Service: General;  Laterality: N/A;   LEFT HEART CATH AND CORONARY ANGIOGRAPHY N/A 08/10/2019   Procedure: LEFT HEART CATH AND CORONARY ANGIOGRAPHY;  Surgeon: Nigel Mormon, MD;  Location: [facility] [facility];  Service: Cardiovascular;  Laterality: N/A;   LEFT HEART CATHETERIZATION WITH CORONARY ANGIOGRAM N/A 02/27/2014   Procedure: LEFT HEART CATHETERIZATION WITH CORONARY ANGIOGRAM;  Surgeon: Laverda Page, MD;  Location: [facility] CATH LAB;  Service: Cardiovascular;  Laterality: N/A;   MEDIASTINAL EXPLORATION N/A 09/06/2019  Procedure: MEDIASTINAL EXPLORATION;  Surgeon: Grace Johntae, MD;  Location: [place];  Service: Thoracic;  Laterality: N/A;   PECTORALIS FLAP  09/06/2019   Procedure: Pectoralis ADVANCEMENT Flap;  Surgeon: Wallace Going, DO;  Location: [place];  Service: Plastics;;   PERCUTANEOUS CORONARY STENT INTERVENTION (PCI-S)  02/27/2014   Procedure: PERCUTANEOUS CORONARY STENT INTERVENTION (PCI-S);  Surgeon: Laverda Page, MD;  Location: [facility];  Service: Cardiovascular;;  rt PDA  3.0/28mm Promus stent   RADIAL ARTERY HARVEST Left 08/15/2019   Procedure: Radial Artery Harvest;  Surgeon: [name], MD;  Location: [place];  Service: Open Heart Surgery;  Laterality: Left;   RIB PLATING N/A 09/06/2019   Procedure: STERNAL PLATING;  Surgeon: Grace Glyn, MD;  Location: [place];  Service: Thoracic;  Laterality: N/A;   TEE WITHOUT CARDIOVERSION N/A 08/15/2019   Procedure: TRANSESOPHAGEAL ECHOCARDIOGRAM (TEE);  Surgeon: [name], MD;  Location:  [place];  Service: Open Heart Surgery;  Laterality: N/A;   TRACHEOSTOMY TUBE PLACEMENT  08/29/2019   Procedure: Tracheostomy;  Surgeon: [name], MD;  [facility];  Service: Open Heart Surgery; Decannulated 6/10/20211   TRANSURETHRAL RESECTION OF PROSTATE  04/04/2012   Procedure: TRANSURETHRAL RESECTION OF THE PROSTATE WITH GYRUS INSTRUMENTS;  Surgeon: Ailene Rud, MD;  Location: [facility];  Service: Urology;  Laterality: N/A;   TRANSURETHRAL RESECTION OF PROSTATE N/A 09/27/2014   Procedure: TRANSURETHRAL RESECTION OF THE PROSTATE ;  Surgeon: Carolan Clines, MD;  Location: [facility];  Service: Urology;  Laterality: N/A;   UPPER GASTROINTESTINAL ENDOSCOPY     Family History  Problem Relation Age of Onset   Diabetes Mother    Hypertension Mother    Diabetes Father    Diabetes Brother    Hypertension Brother    Bone cancer Brother    Diabetes Brother    Social History   Tobacco Use   Smoking status: Former    Packs/day: 0.50    Years: 20.00    Pack years: 10.00    Types: Cigarettes    Quit date: 1975    Years since quitting: 48.0   Smokeless tobacco: Never  Substance Use Topics   Alcohol use: Not Currently    Comment: very rarely - every now and then 1 drink/1 year if that   ROS   Review of Systems  Constitutional: Positive for malaise/fatigue.  Cardiovascular:  Negative for chest pain, claudication, leg swelling, near-syncope, orthopnea, palpitations, paroxysmal nocturnal dyspnea and syncope.  Respiratory:  Positive for shortness of breath.   Musculoskeletal:  Positive for joint pain.  Gastrointestinal:  Positive for abdominal pain, diarrhea and nausea. Negative for vomiting.  Neurological:  Positive for disturbances in coordination (left sided weakness) and loss of balance.  Objective  Blood pressure 129/73, pulse 97, temperature 97.8 F (36.6 C), temperature source Temporal, height 5\' 7"  (1.702 m), weight 222 lb (100.7 kg), SpO2 94 %.   Vitals with BMI 05/08/2021 02/24/2021 01/29/2021  Height 5\' 7"  5\' 7"  -  Weight 222 lbs 209 lbs 13 oz -  BMI 67.67 20.94 -  Systolic 709 628 366  Diastolic 73 62 70  Pulse 97 78 89   Orthostatic VS for the past 72 hrs (Last 3 readings):  Orthostatic BP Patient Position BP Location Cuff Size Orthostatic Pulse  05/08/21 1116 102/66 Standing Left Arm Large 89  05/08/21 1115 91/64 Sitting Left Arm Large 93  05/08/21 1114 101/64 Supine Left Arm Large 95  Physical Exam Vitals reviewed.  Constitutional:      Comments: He is moderately built and mildly obese in no acute distress.  Neck:     Thyroid: No thyromegaly.  Cardiovascular:     Rate and Rhythm: Normal rate and regular rhythm.     Pulses:          Carotid pulses are 2+ on the right side and 2+ on the left side.      Femoral pulses are 2+ on the right side and 2+ on the left side.      Popliteal pulses are 0 on the right side and 0 on the left side.       Dorsalis pedis pulses are 0 on the right side and 0 on the left side.       Posterior tibial pulses are 0 on the right side and 0 on the left side.     Heart sounds: Normal heart sounds, S1 normal and S2 normal. No murmur heard.   No gallop.     Comments: No JVD Pulmonary:     Effort: No respiratory distress.     Breath sounds: No wheezing, rhonchi or rales.     Comments: Mildly dyspneic while talking during exam Chest:     Comments: Sternotomy scar has healed well Abdominal:     General: Bowel sounds are normal.     Tenderness: There is abdominal tenderness.  Musculoskeletal:     Right lower leg: Edema (minimal) present.     Left lower leg: Edema (minimal) present.  Neurological:     Mental Status: He is alert.   Laboratory examination:   Recent Labs    12/14/20 0124 12/15/20 0045 12/16/20 0022  NA 135 138 139  K 3.9 3.6 3.9  CL 103 106 105  CO2 23 25 23   GLUCOSE 98 204* 127*  BUN 43* 40* 38*  CREATININE 2.69* 2.76* 2.52*  CALCIUM 8.4* 8.6* 8.9  GFRNONAA  24* 24* 26*   CrCl cannot be calculated (Patient's most recent lab result is older than the maximum 21 days allowed.).  CMP Latest Ref Rng & Units 12/16/2020 12/15/2020 12/14/2020  Glucose 70 - 99 mg/dL 127(H) 204(H) 98  BUN 8 - 23 mg/dL 38(H) 40(H) 43(H)  Creatinine 0.61 - 1.24 mg/dL 2.52(H) 2.76(H) 2.69(H)  Sodium 135 - 145 mmol/L 139 138 135  Potassium 3.5 - 5.1 mmol/L 3.9 3.6 3.9  Chloride 98 - 111 mmol/L 105 106 103  CO2 22 - 32 mmol/L 23 25 23   Calcium 8.9 - 10.3 mg/dL 8.9 8.6(L) 8.4(L)  Total Protein 6.5 - 8.1 g/dL - - -  Total Bilirubin 0.3 - 1.2 mg/dL - - -  Alkaline Phos 38 - 126 U/L - - -  AST 15 - 41 U/L - - -  ALT 0 - 44 U/L - - -   CBC Latest Ref Rng & Units 12/12/2020 12/11/2020 10/31/2020  WBC 4.0 - 10.5 K/uL 8.4 9.2 -  Hemoglobin 13.0 - 17.0 g/dL 11.7(L) 10.8(L) 11.2(L)  Hematocrit 39.0 - 52.0 % 38.1(L) 35.9(L) 33.0(L)  Platelets 150 - 400 K/uL 218 203 -   Lipid Panel No results for input(s): CHOL, TRIG, LDLCALC, VLDL, HDL, CHOLHDL, LDLDIRECT in the last 8760 hours.  HEMOGLOBIN A1C Lab Results  Component Value Date   HGBA1C 5.4 03/25/2020   MPG 94 12/20/2019   TSH Recent Labs    05/28/20 1538  TSH 1.24   BNP    Component Value Date/Time   BNP 126.5 (  H) 01/31/2021 1040   BNP 184.8 (H) 12/11/2020 1533   ProBNP    Component Value Date/Time   PROBNP 64.0 05/28/2020 1538   External labs 08/11/2019: HDL 42, LDL 40, total cholesterol 107, triglycerides 71 A1c 5.4%  Allergies  No Known Allergies   Medications Prior to Visit:   Outpatient Medications Prior to Visit  Medication Sig Dispense Refill   albuterol (VENTOLIN HFA) 108 (90 Base) MCG/ACT inhaler TAKE 2 PUFFS BY MOUTH EVERY 6 HOURS AS NEEDED FOR WHEEZE OR SHORTNESS OF BREATH 8.5 each 3   aspirin EC 81 MG tablet Take 81 mg by mouth daily.     atorvastatin (LIPITOR) 20 MG tablet Take 1 tablet (20 mg total) by mouth every evening. 90 tablet 3   clonazePAM (KLONOPIN) 0.5 MG disintegrating tablet TAKE  1 TABLET BY MOUTH TWICE A DAY 60 tablet 0   clopidogrel (PLAVIX) 75 MG tablet TAKE 1 TABLET BY MOUTH EVERY DAY (Patient taking differently: Take 75 mg by mouth daily.) 90 tablet 1   cromolyn (OPTICROM) 4 % ophthalmic solution Place 1 drop into both eyes every Wednesday. At bedtime     [employer] ACETAMINOPHEN 325 MG tablet TAKE 2 CAPS BY MOUTH EVERY 6 HOURS AS NEEDED FOR PAIN FOR TEMP 100F OR ABOVE (Patient taking differently: Take 325 mg by mouth every 6 (six) hours as needed for mild pain or headache (temp over 100).) 60 tablet 0   [employer] SENNA PLUS 8.6-50 MG tablet TAKE 2 TABLETS BY MOUTH AT BEDTIME FOR CONSTIPATION 60 tablet 1   ferrous sulfate 325 (65 FE) MG tablet Take 325 mg by mouth daily with breakfast.     furosemide (LASIX) 80 MG tablet Take 120 mg by mouth 2 (two) times daily.     hydrALAZINE (APRESOLINE) 100 MG tablet Take 1 tablet (100 mg total) by mouth 3 (three) times daily. 270 tablet 3   isosorbide dinitrate (ISORDIL) 30 MG tablet TAKE 1 TABLET BY MOUTH THREE TIMES A DAY (Patient taking differently: Take 30 mg by mouth 2 (two) times daily.) 270 tablet 1   metoprolol succinate (TOPROL-XL) 50 MG 24 hr tablet TAKE 3 TABLETS BY MOUTH EVERY DAY 90 tablet 1   Multiple Vitamin (MULTIVITAMIN) tablet Take 1 tablet by mouth daily.     nitroGLYCERIN (NITROSTAT) 0.4 MG SL tablet Place 0.4 mg under the tongue every 5 (five) minutes as needed for chest pain.     oxyCODONE-acetaminophen (PERCOCET) 5-325 MG tablet Take 1 tablet by mouth every 6 (six) hours as needed for severe pain. 20 tablet 0   potassium chloride (KLOR-CON) 10 MEQ tablet Take 10 mEq by mouth daily.     PREVIDENT 5000 BOOSTER PLUS 1.1 % PSTE Take 1 application by mouth every evening.     sertraline (ZOLOFT) 25 MG tablet TAKE 1 TABLET ONE TIME A DAY 90 tablet 1   silodosin (RAPAFLO) 8 MG CAPS capsule TAKE 1 CAPSULE BY MOUTH EVERYDAY AT BEDTIME (Patient taking differently: Take 8 mg by mouth at bedtime.) 90 capsule 3   Vitamin D,  Ergocalciferol, (DRISDOL) 1.25 MG (50000 UNIT) CAPS capsule Take 1 capsule (50,000 Units total) by mouth every 7 (seven) days. (Patient taking differently: Take 50,000 Units by mouth every Wednesday.) 12 capsule 3   No facility-administered medications prior to visit.   Final Medications at End of Visit    Current Meds  Medication Sig   albuterol (VENTOLIN HFA) 108 (90 Base) MCG/ACT inhaler TAKE 2 PUFFS BY MOUTH EVERY 6 HOURS AS NEEDED  FOR WHEEZE OR SHORTNESS OF BREATH   aspirin EC 81 MG tablet Take 81 mg by mouth daily.   atorvastatin (LIPITOR) 20 MG tablet Take 1 tablet (20 mg total) by mouth every evening.   clonazePAM (KLONOPIN) 0.5 MG disintegrating tablet TAKE 1 TABLET BY MOUTH TWICE A DAY   clopidogrel (PLAVIX) 75 MG tablet TAKE 1 TABLET BY MOUTH EVERY DAY (Patient taking differently: Take 75 mg by mouth daily.)   cromolyn (OPTICROM) 4 % ophthalmic solution Place 1 drop into both eyes every Wednesday. At bedtime   [employer]S ACETAMINOPHEN 325 MG tablet TAKE 2 CAPS BY MOUTH EVERY 6 HOURS AS NEEDED FOR PAIN FOR TEMP 100F OR ABOVE (Patient taking differently: Take 325 mg by mouth every 6 (six) hours as needed for mild pain or headache (temp over 100).)   [employer]S SENNA PLUS 8.6-50 MG tablet TAKE 2 TABLETS BY MOUTH AT BEDTIME FOR CONSTIPATION   ferrous sulfate 325 (65 FE) MG tablet Take 325 mg by mouth daily with breakfast.   furosemide (LASIX) 80 MG tablet Take 120 mg by mouth 2 (two) times daily.   hydrALAZINE (APRESOLINE) 100 MG tablet Take 1 tablet (100 mg total) by mouth 3 (three) times daily.   isosorbide dinitrate (ISORDIL) 30 MG tablet TAKE 1 TABLET BY MOUTH THREE TIMES A DAY (Patient taking differently: Take 30 mg by mouth 2 (two) times daily.)   metoprolol succinate (TOPROL-XL) 50 MG 24 hr tablet TAKE 3 TABLETS BY MOUTH EVERY DAY   Multiple Vitamin (MULTIVITAMIN) tablet Take 1 tablet by mouth daily.   nitroGLYCERIN (NITROSTAT) 0.4 MG SL tablet Place 0.4 mg under the tongue every 5 (five)  minutes as needed for chest pain.   oxyCODONE-acetaminophen (PERCOCET) 5-325 MG tablet Take 1 tablet by mouth every 6 (six) hours as needed for severe pain.   potassium chloride (KLOR-CON) 10 MEQ tablet Take 10 mEq by mouth daily.   PREVIDENT 5000 BOOSTER PLUS 1.1 % PSTE Take 1 application by mouth every evening.   sertraline (ZOLOFT) 25 MG tablet TAKE 1 TABLET ONE TIME A DAY   silodosin (RAPAFLO) 8 MG CAPS capsule TAKE 1 CAPSULE BY MOUTH EVERYDAY AT BEDTIME (Patient taking differently: Take 8 mg by mouth at bedtime.)   Vitamin D, Ergocalciferol, (DRISDOL) 1.25 MG (50000 UNIT) CAPS capsule Take 1 capsule (50,000 Units total) by mouth every 7 (seven) days. (Patient taking differently: Take 50,000 Units by mouth every Wednesday.)    Radiology:  No results found.  Cardiac Studies:   Heart catheterization Aug 30, 2019: LM: Normal LAD: Ostial 95% stenosis Ramus: Ostial/prox 30% stenosis LCx: Ostial 75% stenosis RCA: Prox-mid diffuse 40% calcific disease. Distal RCA 70% ISR of 3.0 by 28 mm Promus placed 02/27/2014.  Pre-CABG Dopplers including ABI and carotid duplex: No significant carotid artery stenosis.  Antegrade vertebral artery flow. ABI normal, with triphasic waveforms at the level of the ankle.  Hx of CABG 08/15/2019: LIMA TO LAD, RIMA TO PDA, SEQUENTIAL RADIAL ARTERY TO Cx-OM1.  Echocardiogram 07/22/2020: Left ventricle cavity is normal in size. Mild concentric hypertrophy of the left ventricle. Normal global wall motion. Normal LV systolic function with EF 67%. Normal diastolic filling pattern.  Trileaflet aortic valve with  Mild aortic valve leaflet calcification. Trace aortic valve stenosis. No regurgitation. Normal right atrial pressure.  Echocardiogram 12/12/2020:  1. Left ventricular ejection fraction, by estimation, is 45 to 50%. The  left ventricle has low normal function. Left ventricular endocardial  border not optimally defined to evaluate regional wall motion. Left  ventricular diastolic function could not be  evaluated. There is mild of the left ventricular, inferolateral wall.   2. Right ventricular systolic function is normal. The right ventricular  size is normal.   3. Left atrial size was mildly dilated.   4. The mitral valve is normal in structure. Trivial mitral valve  regurgitation.   5. The aortic valve is tricuspid. Aortic valve regurgitation is not  visualized. Mild to moderate aortic valve sclerosis/calcification is  present, without any evidence of aortic stenosis.   6. The inferior vena cava is normal in size with greater than 50%  respiratory variability, suggesting right atrial pressure of 3 mmHg.   Bilateral lower extremity DVT study 12/12/2020: No evidence of DVT  PCV MYOCARDIAL PERFUSION WO LEXISCAN 01/20/2021 Lexiscan nuclear stress test performed using 1-day protocol. Normal myocardial perfusion. Stress LVEF calculated 49%, although visually appears normal. Low risk study.  EKG:   05/08/2021: Sinus rhythm with PVCs at a rate of 97 bpm.  Normal axis.  Incomplete right bundle branch block.  Poor R wave progression, cannot exclude anteroseptal infarct old.  Compared EKG 12/18/2020, no significant change.  12/18/2020: sinus rhythm with PVCs at a rate of 96 bpm.  Normal axis.  Incomplete right bundle branch block.  Poor R wave progression, cannot exclude anteroseptal infarct old.  Compared to EKG 07/11/2020, no significant change.  12/11/2020: Sinus rhythm at a rate of 88 bpm.  Left axis, incomplete right bundle branch block.  Poor R wave progression, cannot exclude anteroseptal infarct old.  Unchanged compared to EKG 07/11/2020.  Assessment     ICD-10-CM   1. Chronic combined systolic and diastolic heart failure (HCC)  I50.42 EKG 12-Lead    CBC    COMPLETE METABOLIC PANEL WITH GFR    B [name] Peptide    PCV ECHOCARDIOGRAM COMPLETE    2. Ischemic cardiomyopathy  I25.5 PCV ECHOCARDIOGRAM COMPLETE    3. Coronary artery disease of  native artery of native heart with stable angina pectoris ([place])  I25.118 EKG 12-Lead    4. Hx of CABG 08/15/2019: LIMA TO LAD, RIMA TO PDA, SEQUENTIAL RADIAL ARTERY TO Cx-OM1.  Z95.1       No orders of the defined types were placed in this encounter.   There are no discontinued medications.    Orders Placed This Encounter  Procedures   CBC   COMPLETE METABOLIC PANEL WITH GFR   B [name] Peptide   EKG 12-Lead   PCV ECHOCARDIOGRAM COMPLETE    Standing Status:   Future    Standing Expiration Date:   05/08/2022    Recommendations:   Christian [name]  is a 76 y.o. AA male with hypertension, hyperlipidemia, DM with stage 3 CKD,  CAD with  stenting to the PDA on 02/27/2014. Patient presented with unstable angina and NSTEMI on 08/09/2019 along with a very small subsegmental pulmonary embolism and discharged on 10/18/2019 after he underwent CABG x4 on 08/15/2019.    Echocardiogram 11/2020 revealed mildly reduced LVEF at 45-50%, therefore patient underwent stress test which was overall low risk.    Patient presents for 43-month follow-up.  At last office visit patient continued to complain of dyspnea improved with albuterol inhaler, therefore referred him to pulmonology for further evaluation.  She is scheduled to see pulmonology later this month.  Suspect patient's blood pressure is soft and he is tachycardic today in the setting of GI illness with ongoing diarrhea for the last 5 days.  I am concerned he may be dehydrated.  Have therefore advised patient to increase fluid intake and to hold hydralazine for the next few days.  Patient and his wife both verbalized understanding and agreement.  We will obtain BMP, BNP, CBC.  I have also advised patient to follow-up with PCP regarding diarrhea and nausea.  We will also obtain repeat echocardiogram as it has been >3 months since last echo.  He also does continue to have dyspnea, therefore would like to reevaluate.  Follow-up in 3 months, sooner if  needed.   Alethia Berthold, PA-C 05/08/2021, 12:26 PM Office: [phone]

## 2021-05-09 ENCOUNTER — Telehealth: Payer: Self-pay | Admitting: Internal Medicine

## 2021-05-09 LAB — CBC
Hematocrit: 35.7 % — ABNORMAL LOW (ref 37.5–51.0)
Hemoglobin: 11.9 g/dL — ABNORMAL LOW (ref 13.0–17.7)
MCH: 32.3 pg (ref 26.6–33.0)
MCHC: 33.3 g/dL (ref 31.5–35.7)
MCV: 97 fL (ref 79–97)
Platelets: 205 10*3/uL (ref 150–450)
RBC: 3.68 x10E6/uL — ABNORMAL LOW (ref 4.14–5.80)
RDW: 13.6 % (ref 11.6–15.4)
WBC: 8 10*3/uL (ref 3.4–10.8)

## 2021-05-09 LAB — BRAIN NATRIURETIC PEPTIDE: BNP: 113 pg/mL — ABNORMAL HIGH (ref 0.0–100.0)

## 2021-05-09 NOTE — Telephone Encounter (Signed)
Patient spouse calling in  Says patient has been having trouble getting up & moving around lately (no other details)  Requesting cb from nurse [phone]

## 2021-05-09 NOTE — Telephone Encounter (Signed)
Spoke with the pt and he stated that his not having issues moving or getting up. He is having issues with constipation or diarrhea, he mentioned that everything he eats goes straight thru him. His cardologist told him to make our office aware.

## 2021-05-09 NOTE — Telephone Encounter (Signed)
Can have visit to assess

## 2021-05-12 ENCOUNTER — Other Ambulatory Visit: Payer: Self-pay

## 2021-05-12 ENCOUNTER — Ambulatory Visit: Payer: Federal, State, Local not specified - PPO

## 2021-05-12 DIAGNOSIS — I255 Ischemic cardiomyopathy: Secondary | ICD-10-CM

## 2021-05-12 DIAGNOSIS — I5042 Chronic combined systolic (congestive) and diastolic (congestive) heart failure: Secondary | ICD-10-CM

## 2021-05-12 NOTE — Telephone Encounter (Signed)
Spoke with the pt and his wife. Appt has been scheduled for 1.20.2023 at 3:20 pm.

## 2021-05-13 ENCOUNTER — Other Ambulatory Visit: Payer: Self-pay | Admitting: Student

## 2021-05-14 NOTE — Progress Notes (Signed)
Pt stated his GI symptoms are better today then they were when he was in here. Pt has not eaten much today though because he is afraid it will mess with his stomach. Will forward the Results to his PCP.

## 2021-05-14 NOTE — Progress Notes (Signed)
Lawerance Cruel, PA requested we send over pts lab results. Pt stated he will be seeing you on Friday. Please review.

## 2021-05-16 ENCOUNTER — Encounter: Payer: Self-pay | Admitting: Internal Medicine

## 2021-05-16 ENCOUNTER — Other Ambulatory Visit: Payer: Self-pay

## 2021-05-16 ENCOUNTER — Ambulatory Visit (INDEPENDENT_AMBULATORY_CARE_PROVIDER_SITE_OTHER): Payer: Medicare Other | Admitting: Internal Medicine

## 2021-05-16 VITALS — BP 130/60 | HR 89 | Temp 98.4°F | Ht 67.0 in | Wt 214.0 lb

## 2021-05-16 DIAGNOSIS — R221 Localized swelling, mass and lump, neck: Secondary | ICD-10-CM | POA: Diagnosis not present

## 2021-05-16 DIAGNOSIS — I255 Ischemic cardiomyopathy: Secondary | ICD-10-CM | POA: Diagnosis not present

## 2021-05-16 HISTORY — DX: Localized swelling, mass and lump, neck: R22.1

## 2021-05-16 NOTE — Progress Notes (Signed)
° °  Subjective:   Patient ID: Christian [name], male    DOB: 06-02-1945, 76 y.o.   MRN: [id number]  Diarrhea  Pertinent negatives include no abdominal pain, coughing or vomiting.  Neck Pain  Pertinent negatives include no chest pain.  The patient is a 76 YO man coming in for follow up and concerns.  Review of Systems  Constitutional:  Positive for activity change and fatigue.  HENT: Negative.    Eyes: Negative.   Respiratory:  Positive for shortness of breath. Negative for cough and chest tightness.   Cardiovascular:  Negative for chest pain, palpitations and leg swelling.  Gastrointestinal:  Positive for diarrhea. Negative for abdominal distention, abdominal pain, constipation, nausea and vomiting.  Musculoskeletal:  Positive for neck pain.  Skin: Negative.   Neurological: Negative.   Psychiatric/Behavioral: Negative.     Objective:  Physical Exam Constitutional:      Appearance: He is well-developed.  HENT:     Head: Normocephalic and atraumatic.  Cardiovascular:     Rate and Rhythm: Normal rate and regular rhythm.  Pulmonary:     Effort: Pulmonary effort is normal. No respiratory distress.     Breath sounds: Normal breath sounds. No wheezing or rales.  Abdominal:     General: Bowel sounds are normal. There is no distension.     Palpations: Abdomen is soft.     Tenderness: There is no abdominal tenderness. There is no rebound.  Musculoskeletal:     Cervical back: Normal range of motion.  Skin:    General: Skin is warm and dry.  Neurological:     Mental Status: He is alert and oriented to person, place, and time.     Coordination: Coordination normal.    Vitals:   05/16/21 1521  BP: 130/60  Pulse: 89  Temp: 98.4 F (36.9 C)  TempSrc: Oral  SpO2: 94%  Weight: 214 lb (97.1 kg)  Height: 5\' 7"  (1.702 m)    This visit occurred during the SARS-CoV-2 public health emergency.  Safety protocols were in place, including screening questions prior to the visit, additional  usage of staff PPE, and extensive cleaning of exam room while observing appropriate contact time as indicated for disinfecting solutions.   Assessment & Plan:  Visit time 25 minutes in face to face communication with patient and coordination of care, additional 5 minutes spent in record review, coordination or care, ordering tests, communicating/referring to other healthcare professionals, documenting in medical records all on the same day of the visit for total time 30 minutes spent on the visit.

## 2021-05-16 NOTE — Assessment & Plan Note (Signed)
His wife was frustrated during visit that she has to nag him to take medications and if she left this to him he would not take which he denies. We had long extensive conversation that the two of them need to set clear boundaries and expectations with each other and that Christian [name] would need to understand that taking his medications is his choice but his life will be dramatically shorter if he does not take his medications and could cause death.

## 2021-05-16 NOTE — Patient Instructions (Addendum)
You can take pepto bismol or imodium over the counter.   Call the cardiologist to get the results of the kidney test from recently.   We will get an ultrasound of the left neck.  It is okay to stop the iron pills if you want.

## 2021-05-16 NOTE — Assessment & Plan Note (Signed)
Swelling left clavicular region and ordered US neck. This is new and unclear if related to heart failure. No masses detected on exam.

## 2021-05-18 ENCOUNTER — Other Ambulatory Visit: Payer: Self-pay | Admitting: Internal Medicine

## 2021-05-18 NOTE — Telephone Encounter (Signed)
Ok to pcp please °

## 2021-05-19 NOTE — Progress Notes (Signed)
Called and spoke with patient regarding his echocardiogram results.  ?

## 2021-05-20 ENCOUNTER — Ambulatory Visit
Admission: RE | Admit: 2021-05-20 | Discharge: 2021-05-20 | Disposition: A | Discharge status: Home / Self Care | Payer: Medicare Other | Source: Ambulatory Visit | Attending: Internal Medicine | Admitting: Internal Medicine

## 2021-05-20 DIAGNOSIS — R221 Localized swelling, mass and lump, neck: Secondary | ICD-10-CM

## 2021-05-21 NOTE — Progress Notes (Signed)
I just checked and it appears that they must have not seen the order. [facility] did not do the CMP. Are you able to do it at your office or would you like for [place] to place the order and have him go back to [facility].

## 2021-05-22 ENCOUNTER — Encounter: Payer: Self-pay | Admitting: Pulmonary Disease

## 2021-05-22 ENCOUNTER — Other Ambulatory Visit: Payer: Self-pay

## 2021-05-22 ENCOUNTER — Ambulatory Visit (INDEPENDENT_AMBULATORY_CARE_PROVIDER_SITE_OTHER): Payer: Medicare Other

## 2021-05-22 ENCOUNTER — Ambulatory Visit (INDEPENDENT_AMBULATORY_CARE_PROVIDER_SITE_OTHER): Payer: Medicare Other | Admitting: Pulmonary Disease

## 2021-05-22 VITALS — BP 128/72 | HR 99 | Ht 67.0 in | Wt 219.0 lb

## 2021-05-22 DIAGNOSIS — R0602 Shortness of breath: Secondary | ICD-10-CM | POA: Diagnosis not present

## 2021-05-22 DIAGNOSIS — J90 Pleural effusion, not elsewhere classified: Secondary | ICD-10-CM | POA: Diagnosis not present

## 2021-05-22 DIAGNOSIS — J683 Other acute and subacute respiratory conditions due to chemicals, gases, fumes and vapors: Secondary | ICD-10-CM

## 2021-05-22 DIAGNOSIS — I5042 Chronic combined systolic (congestive) and diastolic (congestive) heart failure: Secondary | ICD-10-CM

## 2021-05-22 MED ORDER — MOMETASONE FURO-FORMOTEROL FUM 100-5 MCG/ACT IN AERO: 2.0000 | INHALATION_SPRAY | Freq: Two times a day (BID) | RESPIRATORY_TRACT | 0 refills | Status: DC

## 2021-05-22 NOTE — Progress Notes (Signed)
Synopsis: Referred in January 2022 for shortness of breath by Lawerance Cruel, PA  Subjective:   PATIENT ID: Christian [name] GENDER: male DOB: Apr 15, 1946, MRN: [id number]   HPI  Chief Complaint  Patient presents with   Consult    Referred by cardiologist for SOB. Per patient, has had the SOB for the past year but it has gotten worse over the past 3 months.    Christian [name] is a 76 year old male, former smoker with hypertension, CAD s/p CABG 2021, congestive heart failure and pulmonary embolism in 2021 who is referred to pulmonary clinic for shortness of breath.   Cardiology note reviewed from 05/08/21. He had extremely complex postop CABG complications including cardiogenic shock, cardiac arrest, needing internal CPR in the ICU after opening the chest, A. Fib needing cardioversion, closure of chest wall, developed acute renal failure leading temporary need for dialysis, right brain infarct with left hemiparesis, and multiple blood transfusions. Echocardiogram 11/2020 revealed mildly reduced LVEF at 45-50%, therefore patient underwent stress test which was overall low risk.    He was noted to have left loculated pleural effusion based on chest imaging after his prolonged hospitalization. He reports having migratory chest discomforts that can be on the right side of his chest and move to the middle of his chest. He also experiences intermittent chest tightness. He has intermittent wheezing.   He reports trouble swallowing since his hospitalization in 2021. His wife reports he will eat reclined in his chair at times and have increased coughing when eating.   He reports second hand smoke exposure from his grandfather in childhood. He smoked for 3-4 years when he was in the [employer]. He worked in a [employer] where he was exposed to second hand smoke and he later worked for the post office.   Past Medical History:  Diagnosis Date   Anemia    low iron   BPH (benign prostatic hypertrophy)     CHF (congestive heart failure) (HCC)    Chronic kidney disease    Coronary artery disease    Dry eyes left   Hiatal hernia    Hx of CABG 08/15/2019: x 4 using bilateral IMAs and left radial artery .  LIMA TO LAD, RIMA TO PDA, RADIAL ARTERY TO CIRC AND SEQUENTIALLY TO OM1. 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   Nocturia    PE (pulmonary thromboembolism) ([place]) 08/09/2019   small RLL PE 08/09/19   Pneumonia    as a child   Pre-diabetes    Problems with swallowing pt states test at [facility] approx 2012 shows a gastric valve  dysfunction--  eats small bites and drink liquids slowly   SOB (shortness of breath) on exertion      Family History  Problem Relation Age of Onset   Diabetes Mother    Hypertension Mother    Diabetes Father    Diabetes Brother    Hypertension Brother    Bone cancer Brother    Diabetes Brother      Social History   Socioeconomic History   Marital status: Married    Spouse name: Not on file   Number of children: 2   Years of education: Not on file   Highest education level: Associate degree: academic program  Occupational History   Occupation: Retired  Tobacco Use   Smoking status: Former    Packs/day: 0.50    Years: 20.00  Pack years: 10.00    Types: Cigarettes    Quit date: 58    Years since quitting: 48.1   Smokeless tobacco: Never  Vaping Use   Vaping Use: Never used  Substance and Sexual Activity   Alcohol use: Not Currently    Comment: very rarely - every now and then 1 drink/1 year if that   Drug use: No   Sexual activity: Not Currently  Other Topics Concern   Not on file  Social History Narrative   Not on file   Social Determinants of Health   Financial Resource Strain: Not on file  Food Insecurity: Not on file  Transportation Needs: Not on file  Physical Activity: Not on file  Stress: Not on file  Social Connections: Not on file  Intimate  Partner Violence: Not on file     No Known Allergies   Outpatient Medications Prior to Visit  Medication Sig Dispense Refill   albuterol (VENTOLIN HFA) 108 (90 Base) MCG/ACT inhaler TAKE 2 PUFFS BY MOUTH EVERY 6 HOURS AS NEEDED FOR WHEEZE OR SHORTNESS OF BREATH 8.5 each 3   aspirin EC 81 MG tablet Take 81 mg by mouth daily.     atorvastatin (LIPITOR) 20 MG tablet Take 1 tablet (20 mg total) by mouth every evening. 90 tablet 3   clonazePAM (KLONOPIN) 0.5 MG disintegrating tablet TAKE 1 TABLET BY MOUTH TWICE A DAY 60 tablet 5   clopidogrel (PLAVIX) 75 MG tablet TAKE 1 TABLET BY MOUTH EVERY DAY (Patient taking differently: Take 75 mg by mouth daily.) 90 tablet 1   cromolyn (OPTICROM) 4 % ophthalmic solution Place 1 drop into both eyes every Wednesday. At bedtime     [employer] ACETAMINOPHEN 325 MG tablet TAKE 2 CAPS BY MOUTH EVERY 6 HOURS AS NEEDED FOR PAIN FOR TEMP 100F OR ABOVE (Patient taking differently: Take 325 mg by mouth every 6 (six) hours as needed for mild pain or headache (temp over 100).) 60 tablet 0   [employer] SENNA PLUS 8.6-50 MG tablet TAKE 2 TABLETS BY MOUTH AT BEDTIME FOR CONSTIPATION 60 tablet 1   ferrous sulfate 325 (65 FE) MG tablet Take 325 mg by mouth daily with breakfast.     furosemide (LASIX) 80 MG tablet Take 120 mg by mouth 2 (two) times daily.     hydrALAZINE (APRESOLINE) 100 MG tablet Take 1 tablet (100 mg total) by mouth 3 (three) times daily. 270 tablet 3   isosorbide dinitrate (ISORDIL) 30 MG tablet TAKE 1 TABLET BY MOUTH THREE TIMES A DAY (Patient taking differently: Take 30 mg by mouth 2 (two) times daily.) 270 tablet 1   metoprolol succinate (TOPROL-XL) 50 MG 24 hr tablet TAKE 3 TABLETS BY MOUTH EVERY DAY 90 tablet 1   Multiple Vitamin (MULTIVITAMIN) tablet Take 1 tablet by mouth daily.     nitroGLYCERIN (NITROSTAT) 0.4 MG SL tablet Place 0.4 mg under the tongue every 5 (five) minutes as needed for chest pain.     oxyCODONE-acetaminophen (PERCOCET) 5-325 MG tablet Take 1  tablet by mouth every 6 (six) hours as needed for severe pain. 20 tablet 0   potassium chloride (KLOR-CON) 10 MEQ tablet Take 10 mEq by mouth daily.     PREVIDENT 5000 BOOSTER PLUS 1.1 % PSTE Take 1 application by mouth every evening.     sertraline (ZOLOFT) 25 MG tablet TAKE 1 TABLET ONE TIME A DAY 90 tablet 1   silodosin (RAPAFLO) 8 MG CAPS capsule TAKE 1 CAPSULE BY MOUTH EVERYDAY AT  BEDTIME (Patient taking differently: Take 8 mg by mouth at bedtime.) 90 capsule 3   Vitamin D, Ergocalciferol, (DRISDOL) 1.25 MG (50000 UNIT) CAPS capsule Take 1 capsule (50,000 Units total) by mouth every 7 (seven) days. (Patient taking differently: Take 50,000 Units by mouth every Wednesday.) 12 capsule 3   No facility-administered medications prior to visit.   Review of Systems  Constitutional:  Negative for chills, fever, malaise/fatigue and weight loss.  HENT:  Negative for congestion, sinus pain and sore throat.   Eyes: Negative.   Respiratory:  Positive for cough, shortness of breath and wheezing. Negative for hemoptysis and sputum production.   Cardiovascular:  Positive for chest pain. Negative for palpitations, orthopnea, claudication and leg swelling.  Gastrointestinal:  Negative for abdominal pain, heartburn, nausea and vomiting.  Genitourinary: Negative.   Musculoskeletal:  Negative for joint pain and myalgias.  Skin:  Negative for rash.  Neurological:  Negative for weakness.  Endo/Heme/Allergies: Negative.   Psychiatric/Behavioral: Negative.     Objective:   Vitals:   05/22/21 1130  BP: 128/72  Pulse: 99  SpO2: 98%  Weight: 219 lb (99.3 kg)  Height: 5\' 7"  (1.702 m)    Physical Exam Constitutional:      General: He is not in acute distress.    Appearance: He is obese.  HENT:     Head: Normocephalic and atraumatic.  Eyes:     Extraocular Movements: Extraocular movements intact.     Conjunctiva/sclera: Conjunctivae normal.     Pupils: Pupils are equal, round, and reactive to light.   Cardiovascular:     Rate and Rhythm: Normal rate and regular rhythm.     Pulses: Normal pulses.     Heart sounds: Normal heart sounds. No murmur heard. Pulmonary:     Breath sounds: Examination of the left-lower field reveals decreased breath sounds. Decreased breath sounds present. No wheezing, rhonchi or rales.  Abdominal:     General: Bowel sounds are normal.     Palpations: Abdomen is soft.  Musculoskeletal:     Right lower leg: No edema.     Left lower leg: No edema.  Lymphadenopathy:     Cervical: No cervical adenopathy.  Skin:    General: Skin is warm and dry.  Neurological:     General: No focal deficit present.     Mental Status: He is alert.  Psychiatric:        Mood and Affect: Mood normal.        Behavior: Behavior normal.        Thought Content: Thought content normal.        Judgment: Judgment normal.    CBC    Component Value Date/Time   WBC 8.0 05/08/2021 1201   WBC 8.4 12/12/2020 0336   RBC 3.68 (L) 05/08/2021 1201   RBC 3.55 (L) 12/12/2020 0336   HGB 11.9 (L) 05/08/2021 1201   HCT 35.7 (L) 05/08/2021 1201   PLT 205 05/08/2021 1201   MCV 97 05/08/2021 1201   MCH 32.3 05/08/2021 1201   MCH 33.0 12/12/2020 0336   MCHC 33.3 05/08/2021 1201   MCHC 30.7 12/12/2020 0336   RDW 13.6 05/08/2021 1201   LYMPHSABS 1.6 12/11/2020 1533   MONOABS 0.8 12/11/2020 1533   EOSABS 0.1 12/11/2020 1533   BASOSABS 0.1 12/11/2020 1533   BMP Latest Ref Rng & Units 12/16/2020 12/15/2020 12/14/2020  Glucose 70 - 99 mg/dL 127(H) 204(H) 98  BUN 8 - 23 mg/dL 38(H) 40(H) 43(H)  Creatinine 0.61 -  1.24 mg/dL 2.52(H) 2.76(H) 2.69(H)  BUN/Creat Ratio 10 - 24 - - -  Sodium 135 - 145 mmol/L 139 138 135  Potassium 3.5 - 5.1 mmol/L 3.9 3.6 3.9  Chloride 98 - 111 mmol/L 105 106 103  CO2 22 - 32 mmol/L 23 25 23   Calcium 8.9 - 10.3 mg/dL 8.9 8.6(L) 8.4(L)   Chest imaging: CXR 12/23/20 Persistent and unchanged loculated parapneumonic effusion with associated left basilar atelectasis  versus infiltrate. Stable cardiomegaly. Patient is status post median sternotomy with evidence of prior multivessel CABG. The right lung remains clear. No acute osseous abnormality.  CT Chest wo contrast 12/12/20 1. Cluster of ground-glass nodular density in the right lower lobe most consistent with atypical pneumonia. Small, chronic appearing bilateral pleural effusions, left greater than right. 2. Partial consolidative changes of the left lung base. 3. Aortic Atherosclerosis   PFT: PFT Results Latest Ref Rng & Units 08/11/2019  FVC-Pre L 1.83  FVC-Predicted Pre % 50  Pre FEV1/FVC % % 81  FEV1-Pre L 1.48  FEV1-Predicted Pre % 54    Labs:  Path:  Echo 12/12/20: LV EF 45-50%. RV function and size is normal.   Heart Catheterization 08/10/19: LM: Normal LAD: Ostial 95% stenosis Ramus: Ostial/prox 30% stenosis LCx: Ostial 75% stenosis RCA: Prox-mid diffuse 40% calcific disease         Distal RCA 70% ISR   LVEDP 17 mmHg  NM Cardiac Stress Test 01/20/21 Lexiscan nuclear stress test performed using 1-day protocol. Normal myocardial perfusion. Stress LVEF calculated 49%, although visually appears normal. Low risk study.  Assessment & Plan:   Pleural effusion - Plan: DG Chest 2 View  Shortness of breath - Plan: Pulmonary Function Test  Reactive airways dysfunction syndrome ([place]) - Plan: mometasone-formoterol (DULERA) 100-5 MCG/ACT AERO  Discussion: Datron Brakebill is a 76 year old male, former smoker with hypertension, CAD s/p CABG 2021, congestive heart failure and pulmonary embolism in 2021 who is referred to pulmonary clinic for shortness of breath.   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   He is to start ICS/LABA therapy with dulera 100-69mcg 2 puffs twice daily and continue as needed albuterol.   We will check a chest x-ray today to  monitor the loculated left pleural effusion.   We will check pulmonary function tests at follow up in 1 month.  Christian Jackson, MD [facility] Office: [phone]    Current Outpatient Medications:    albuterol (VENTOLIN HFA) 108 (90 Base) MCG/ACT inhaler, TAKE 2 PUFFS BY MOUTH EVERY 6 HOURS AS NEEDED FOR WHEEZE OR SHORTNESS OF BREATH, Disp: 8.5 each, Rfl: 3   aspirin EC 81 MG tablet, Take 81 mg by mouth daily., Disp: , Rfl:    atorvastatin (LIPITOR) 20 MG tablet, Take 1 tablet (20 mg total) by mouth every evening., Disp: 90 tablet, Rfl: 3   clonazePAM (KLONOPIN) 0.5 MG disintegrating tablet, TAKE 1 TABLET BY MOUTH TWICE A DAY, Disp: 60 tablet, Rfl: 5   clopidogrel (PLAVIX) 75 MG tablet, TAKE 1 TABLET BY MOUTH EVERY DAY (Patient taking differently: Take 75 mg by mouth daily.), Disp: 90 tablet, Rfl: 1   cromolyn (OPTICROM) 4 % ophthalmic solution, Place 1 drop into both eyes every Wednesday. At bedtime, Disp: , Rfl:    CVS ACETAMINOPHEN 325 MG tablet, TAKE 2 CAPS BY MOUTH EVERY 6 HOURS AS NEEDED FOR PAIN FOR  TEMP 100F OR ABOVE (Patient taking differently: Take 325 mg by mouth every 6 (six) hours as needed for mild pain or headache (temp over 100).), Disp: 60 tablet, Rfl: 0   CVS SENNA PLUS 8.6-50 MG tablet, TAKE 2 TABLETS BY MOUTH AT BEDTIME FOR CONSTIPATION, Disp: 60 tablet, Rfl: 1   ferrous sulfate 325 (65 FE) MG tablet, Take 325 mg by mouth daily with breakfast., Disp: , Rfl:    furosemide (LASIX) 80 MG tablet, Take 120 mg by mouth 2 (two) times daily., Disp: , Rfl:    hydrALAZINE (APRESOLINE) 100 MG tablet, Take 1 tablet (100 mg total) by mouth 3 (three) times daily., Disp: 270 tablet, Rfl: 3   isosorbide dinitrate (ISORDIL) 30 MG tablet, TAKE 1 TABLET BY MOUTH THREE TIMES A DAY (Patient taking differently: Take 30 mg by mouth 2 (two) times daily.), Disp: 270 tablet, Rfl: 1   metoprolol succinate (TOPROL-XL) 50 MG 24 hr tablet, TAKE 3 TABLETS BY MOUTH EVERY DAY,  Disp: 90 tablet, Rfl: 1   mometasone-formoterol (DULERA) 100-5 MCG/ACT AERO, Inhale 2 puffs into the lungs 2 (two) times daily., Disp: 1 each, Rfl: 0   Multiple Vitamin (MULTIVITAMIN) tablet, Take 1 tablet by mouth daily., Disp: , Rfl:    nitroGLYCERIN (NITROSTAT) 0.4 MG SL tablet, Place 0.4 mg under the tongue every 5 (five) minutes as needed for chest pain., Disp: , Rfl:    oxyCODONE-acetaminophen (PERCOCET) 5-325 MG tablet, Take 1 tablet by mouth every 6 (six) hours as needed for severe pain., Disp: 20 tablet, Rfl: 0   potassium chloride (KLOR-CON) 10 MEQ tablet, Take 10 mEq by mouth daily., Disp: , Rfl:    PREVIDENT 5000 BOOSTER PLUS 1.1 % PSTE, Take 1 application by mouth every evening., Disp: , Rfl:    sertraline (ZOLOFT) 25 MG tablet, TAKE 1 TABLET ONE TIME A DAY, Disp: 90 tablet, Rfl: 1   silodosin (RAPAFLO) 8 MG CAPS capsule, TAKE 1 CAPSULE BY MOUTH EVERYDAY AT BEDTIME (Patient taking differently: Take 8 mg by mouth at bedtime.), Disp: 90 capsule, Rfl: 3   Vitamin D, Ergocalciferol, (DRISDOL) 1.25 MG (50000 UNIT) CAPS capsule, Take 1 capsule (50,000 Units total) by mouth every 7 (seven) days. (Patient taking differently: Take 50,000 Units by mouth every Wednesday.), Disp: 12 capsule, Rfl: 3

## 2021-05-22 NOTE — Progress Notes (Signed)
Called and spoke to pt, pt prefers to go to [facility]. Labs have been ordered and released. Will fax to PCP when we get the results back.

## 2021-05-22 NOTE — Patient Instructions (Addendum)
Start using dulera inhaler 2 puffs twice daily - rinse mouth out after each use  Use albuterol inhaler 1-2 puffs every 4-6 hours as needed for shortness of breath or wheezing  We will check a chest x-ray today to monitor the fluid around your left lung  Follow up in 4-6 weeks for pulmonary function tests

## 2021-05-23 LAB — CMP14+EGFR
ALT: 25 IU/L (ref 0–44)
AST: 20 IU/L (ref 0–40)
Albumin/Globulin Ratio: 1.9 (ref 1.2–2.2)
Albumin: 4.5 g/dL (ref 3.7–4.7)
Alkaline Phosphatase: 96 IU/L (ref 44–121)
BUN/Creatinine Ratio: 13 (ref 10–24)
BUN: 35 mg/dL — ABNORMAL HIGH (ref 8–27)
Bilirubin Total: 0.2 mg/dL (ref 0.0–1.2)
CO2: 24 mmol/L (ref 20–29)
Calcium: 9.1 mg/dL (ref 8.6–10.2)
Chloride: 105 mmol/L (ref 96–106)
Creatinine, Ser: 2.73 mg/dL — ABNORMAL HIGH (ref 0.76–1.27)
Globulin, Total: 2.4 g/dL (ref 1.5–4.5)
Glucose: 177 mg/dL — ABNORMAL HIGH (ref 70–99)
Potassium: 3.9 mmol/L (ref 3.5–5.2)
Sodium: 146 mmol/L — ABNORMAL HIGH (ref 134–144)
Total Protein: 6.9 g/dL (ref 6.0–8.5)
eGFR: 24 mL/min/{1.73_m2} — ABNORMAL LOW (ref >59)

## 2021-05-25 ENCOUNTER — Other Ambulatory Visit: Payer: Self-pay | Admitting: Internal Medicine

## 2021-05-27 DIAGNOSIS — I10 Essential (primary) hypertension: Secondary | ICD-10-CM

## 2021-05-27 DIAGNOSIS — E785 Hyperlipidemia, unspecified: Secondary | ICD-10-CM | POA: Diagnosis not present

## 2021-05-27 DIAGNOSIS — E1169 Type 2 diabetes mellitus with other specified complication: Secondary | ICD-10-CM

## 2021-05-27 DIAGNOSIS — N184 Chronic kidney disease, stage 4 (severe): Secondary | ICD-10-CM

## 2021-05-28 ENCOUNTER — Ambulatory Visit (INDEPENDENT_AMBULATORY_CARE_PROVIDER_SITE_OTHER): Payer: Medicare Other | Admitting: Podiatry

## 2021-05-28 ENCOUNTER — Other Ambulatory Visit: Payer: Self-pay

## 2021-05-28 ENCOUNTER — Encounter: Payer: Self-pay | Admitting: Podiatry

## 2021-05-28 DIAGNOSIS — E119 Type 2 diabetes mellitus without complications: Secondary | ICD-10-CM

## 2021-05-28 DIAGNOSIS — M79675 Pain in left toe(s): Secondary | ICD-10-CM | POA: Diagnosis not present

## 2021-05-28 DIAGNOSIS — M2141 Flat foot [pes planus] (acquired), right foot: Secondary | ICD-10-CM

## 2021-05-28 DIAGNOSIS — M2142 Flat foot [pes planus] (acquired), left foot: Secondary | ICD-10-CM

## 2021-05-28 DIAGNOSIS — G35 Multiple sclerosis: Secondary | ICD-10-CM | POA: Diagnosis not present

## 2021-05-28 DIAGNOSIS — E1142 Type 2 diabetes mellitus with diabetic polyneuropathy: Secondary | ICD-10-CM

## 2021-05-28 DIAGNOSIS — B351 Tinea unguium: Secondary | ICD-10-CM

## 2021-05-28 DIAGNOSIS — M79674 Pain in right toe(s): Secondary | ICD-10-CM

## 2021-05-28 NOTE — Progress Notes (Signed)
Called and spoke with patient regarding his recent lab results. Labs faxed to PCP.

## 2021-05-29 ENCOUNTER — Telehealth: Payer: Self-pay

## 2021-05-29 NOTE — Chronic Care Management (AMB) (Signed)
Chronic Care Management Pharmacy Assistant   Name: Christian [name]  MRN: [id number] DOB: 28-Aug-1945   Reason for Encounter: Disease State   Conditions to be addressed/monitored: HTN   Recent office visits:  05/16/21 Hoyt Koch, MD-PCP (Neck swelling)   Recent consult visits:  05/28/21 [place]ala[place],Jennifer DPM-Podiatry (Nail problem) No med changes  05/22/21 Freddi Starr, MD-Pulmonary Disease (Pleural effusion)  Hospital visits:  None in previous 6 months  Medications: Outpatient Encounter Medications as of 05/29/2021  Medication Sig   albuterol (VENTOLIN HFA) 108 (90 Base) MCG/ACT inhaler TAKE 2 PUFFS BY MOUTH EVERY 6 HOURS AS NEEDED FOR WHEEZE OR SHORTNESS OF BREATH   aspirin EC 81 MG tablet Take 81 mg by mouth daily.   atorvastatin (LIPITOR) 20 MG tablet Take 1 tablet (20 mg total) by mouth every evening.   clonazePAM (KLONOPIN) 0.5 MG disintegrating tablet TAKE 1 TABLET BY MOUTH TWICE A DAY   clopidogrel (PLAVIX) 75 MG tablet TAKE 1 TABLET BY MOUTH EVERY DAY (Patient taking differently: Take 75 mg by mouth daily.)   cromolyn (OPTICROM) 4 % ophthalmic solution Place 1 drop into both eyes every Wednesday. At bedtime   [facility] ACETAMINOPHEN 325 MG tablet TAKE 2 CAPS BY MOUTH EVERY 6 HOURS AS NEEDED FOR PAIN FOR TEMP 100F OR ABOVE (Patient taking differently: Take 325 mg by mouth every 6 (six) hours as needed for mild pain or headache (temp over 100).)   CVS SENNA PLUS 8.6-50 MG tablet TAKE 2 TABLETS BY MOUTH AT BEDTIME FOR CONSTIPATION   ferrous sulfate 325 (65 FE) MG tablet Take 325 mg by mouth daily with breakfast.   furosemide (LASIX) 80 MG tablet Take 120 mg by mouth 2 (two) times daily.   hydrALAZINE (APRESOLINE) 100 MG tablet Take 1 tablet (100 mg total) by mouth 3 (three) times daily.   isosorbide dinitrate (ISORDIL) 30 MG tablet TAKE 1 TABLET BY MOUTH THREE TIMES A DAY (Patient taking differently: Take 30 mg by mouth 2 (two) times daily.)   metoprolol succinate  (TOPROL-XL) 50 MG 24 hr tablet TAKE 3 TABLETS BY MOUTH EVERY DAY   mometasone-formoterol (DULERA) 100-5 MCG/ACT AERO Inhale 2 puffs into the lungs 2 (two) times daily.   Multiple Vitamin (MULTIVITAMIN) tablet Take 1 tablet by mouth daily.   nitroGLYCERIN (NITROSTAT) 0.4 MG SL tablet Place 0.4 mg under the tongue every 5 (five) minutes as needed for chest pain.   oxyCODONE-acetaminophen (PERCOCET) 5-325 MG tablet Take 1 tablet by mouth every 6 (six) hours as needed for severe pain.   potassium chloride (KLOR-CON) 10 MEQ tablet Take 10 mEq by mouth daily.   PREVIDENT 5000 BOOSTER PLUS 1.1 % PSTE Take 1 application by mouth every evening.   sertraline (ZOLOFT) 25 MG tablet TAKE 1 TABLET ONE TIME A DAY   silodosin (RAPAFLO) 8 MG CAPS capsule TAKE 1 CAPSULE BY MOUTH EVERYDAY AT BEDTIME (Patient taking differently: Take 8 mg by mouth at bedtime.)   Vitamin D, Ergocalciferol, (DRISDOL) 1.25 MG (50000 UNIT) CAPS capsule TAKE 1 CAPSULE (50,000 UNITS TOTAL) BY MOUTH EVERY 7 (SEVEN) DAYS   No facility-administered encounter medications on file as of 05/29/2021.   Reviewed chart prior to disease state call. Spoke with patient regarding BP  Recent Office Vitals: BP Readings from Last 3 Encounters:  05/22/21 128/72  05/16/21 130/60  05/08/21 129/73   Pulse Readings from Last 3 Encounters:  05/22/21 99  05/16/21 89  05/08/21 97    Wt Readings from Last 3 Encounters:  05/22/21 219 lb (99.3 kg)  05/16/21 214 lb (97.1 kg)  05/08/21 222 lb (100.7 kg)     Kidney Function Lab Results  Component Value Date/Time   CREATININE 2.73 (H) 05/22/2021 03:51 PM   CREATININE 2.52 (H) 12/16/2020 12:22 AM   CREATININE 2.87 (H) 12/20/2019 01:12 PM   GFR 17.66 (L) 05/28/2020 03:38 PM   GFRNONAA 26 (L) 12/16/2020 12:22 AM   GFRAA 19 (L) 10/18/2019 12:46 AM    BMP Latest Ref Rng & Units 05/22/2021 12/16/2020 12/15/2020  Glucose 70 - 99 mg/dL 177(H) 127(H) 204(H)  BUN 8 - 27 mg/dL 35(H) 38(H) 40(H)  Creatinine  0.76 - 1.27 mg/dL 2.73(H) 2.52(H) 2.76(H)  BUN/Creat Ratio 10 - 24 13 - -  Sodium 134 - 144 mmol/L 146(H) 139 138  Potassium 3.5 - 5.2 mmol/L 3.9 3.9 3.6  Chloride 96 - 106 mmol/L 105 105 106  CO2 20 - 29 mmol/L 24 23 25   Calcium 8.6 - 10.2 mg/dL 9.1 8.9 8.6(L)    Current antihypertensive regimen:  Hydralazine 100 mg Isosorbide 30 mg Metoprolol succ 50 mg  How often are you checking your Blood Pressure? infrequently  Current home BP readings: Patient states that he does not have any home reading. He has been under the weather that last couple days and states that he needs something for a head cold and cough. Advised to call Dr. Sharlet Salina  What recent interventions/DTPs have been made by any provider to improve Blood Pressure control since last CPP Visit: none noted  Any recent hospitalizations or ED visits since last visit with CPP? No  What diet changes have been made to improve Blood Pressure Control?  Patient states that he has not changed his diet  What exercise is being done to improve your Blood Pressure Control?  Patient states that he gets very little exercise but he knows he needs to do more  Adherence Review: Is the patient currently on ACE/ARB medication? No Does the patient have >5 day gap between last estimated fill dates? No   Care Gaps: Colonoscopy-09/26/08 Diabetic Foot Exam-05/28/21 Ophthalmology-05/30/20 Dexa Scan - NA Annual Well Visit - NA Micro albumin-NA Hemoglobin A1c- 03/25/20  Star Rating Drugs: Atorvastatin 20 mg-last fill 04/05/21 90 ds   Ethelene Hal Clinical Pharmacist Assistant [phone]

## 2021-05-30 ENCOUNTER — Telehealth: Payer: Self-pay | Admitting: Pulmonary Disease

## 2021-05-30 ENCOUNTER — Other Ambulatory Visit (HOSPITAL_COMMUNITY): Payer: Self-pay

## 2021-05-30 MED ORDER — BUDESONIDE-FORMOTEROL FUMARATE 160-4.5 MCG/ACT IN AERO: 2.0000 | INHALATION_SPRAY | Freq: Two times a day (BID) | RESPIRATORY_TRACT | 6 refills | Status: DC

## 2021-05-30 NOTE — Telephone Encounter (Signed)
Symbicort sent in to [facility], nothing further needed.

## 2021-05-30 NOTE — Telephone Encounter (Signed)
Spoke to patient, who stated that that Ruthe Mannan is not affordable.   Pharmacy team, can you guys assist? Thanks

## 2021-05-30 NOTE — Telephone Encounter (Signed)
Patient stated 47 dollars is affordable.   Dr. Erin Fulling, please advise if okay to switch dulera to Symbicort or advair.

## 2021-05-30 NOTE — Telephone Encounter (Signed)
Please send in symbicort 160-4.49mcg 2 puffs twice daily.  Thanks, JD

## 2021-06-03 DIAGNOSIS — H40013 Open angle with borderline findings, low risk, bilateral: Secondary | ICD-10-CM | POA: Diagnosis not present

## 2021-06-03 DIAGNOSIS — H25813 Combined forms of age-related cataract, bilateral: Secondary | ICD-10-CM | POA: Diagnosis not present

## 2021-06-03 DIAGNOSIS — H04123 Dry eye syndrome of bilateral lacrimal glands: Secondary | ICD-10-CM | POA: Diagnosis not present

## 2021-06-03 DIAGNOSIS — E119 Type 2 diabetes mellitus without complications: Secondary | ICD-10-CM | POA: Diagnosis not present

## 2021-06-03 NOTE — Progress Notes (Signed)
ANNUAL DIABETIC FOOT EXAM  Subjective: Christian [name] presents today for for annual diabetic foot examination.  Patient relates 3 year h/o diabetes.  Patient denies any h/o foot wounds.  Patient endorses symptoms of foot numbness in LLE.  Patient did not check blood glucose this morning.  Risk factors: diabetes, diabetic neuropathy, CKD, hyperlipidemia, HTN, CAD, CHF.  Christian Koch, MD is patient's PCP. Last visit was 05/16/2021.  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  LIMA TO LAD, RIMA TO PDA, RADIAL ARTERY TO CIRC AND SEQUENTIALLY TO OM1. 08/15/2019   Hyperlipidemia    Hypertension    Incomplete bladder emptying    Myocardial infarction ([place]) 08/09/2019   Nocturia    PE (pulmonary thromboembolism) ([place]) 08/09/2019   small RLL PE 08/09/19   Pneumonia    as a child   Pre-diabetes    Problems with swallowing pt states test at [facility] approx 2012 shows a gastric valve  dysfunction--  eats small bites and drink liquids slowly   SOB (shortness of breath) on exertion    Patient Active Problem List   Diagnosis Date Noted   Neck swelling 05/16/2021   Atypical pneumonia 12/12/2020   Pleural effusion 12/12/2020   Fluid overload 12/12/2020   Pulmonary edema 10/03/2020   Sleep difficulties 05/30/2020   Anemia due to blood loss 04/24/2020   CKD (chronic kidney disease) stage 4, GFR 15-29 ml/min (HCC) 03/27/2020   Aortic atherosclerosis ([place]) 03/27/2020   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   Unspecified protein-calorie malnutrition ([place]) 10/27/2019   Secondary hyperparathyroidism of renal origin ([place]) 10/17/2019   Coronary artery disease 08/15/2019   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  LIMA TO LAD, RIMA TO PDA, RADIAL ARTERY TO CIRC AND SEQUENTIALLY TO OM1. 08/15/2019   Ischemic cardiomyopathy    Swelling of lower extremity    History of coronary artery stent placement    Pulmonary embolism ([place]) 08/09/2019   Elevated liver enzymes 08/09/2019   Left leg weakness 11/23/2018   CAD (coronary artery disease) 07/30/2018   Routine general medical examination at a health care facility 08/30/2016   Diabetes mellitus with polyneuropathy ([place]) 10/10/2014   Benign prostatic hyperplasia 09/27/2014   Stable angina pectoris ([place]) 02/27/2014   S/P PTCA (percutaneous transluminal coronary angioplasty) 02/27/2014   Hyperlipidemia associated with type 2 diabetes mellitus ([place]) 11/12/2009   MULTIPLE SCLEROSIS 11/12/2009   HTN (hypertension) 11/12/2009   Shortness of breath 11/12/2009   Past Surgical History:  Procedure Laterality Date   APPLICATION OF WOUND VAC N/A 08/24/2019   Procedure: APPLICATION OF WOUND VAC;  Surgeon: [name], MD;  Location: [place];  Service: Thoracic;  Laterality: N/A;   APPLICATION OF WOUND VAC  08/29/2019   Procedure: Wound Vac change;  Surgeon: [name], MD;  Location: [place] [facility];  Service: Open Heart Surgery;;   APPLICATION OF WOUND VAC N/A 09/04/2019   Procedure: Application Of Wound Vac;  Surgeon: Grace Ilyas, MD;  Location: [place];  Service: Open Heart Surgery;  Laterality: N/A;   APPLICATION OF WOUND VAC N/A 09/06/2019   Procedure:  APPLICATION OF ACELL, APPLICATION OF WOUND VAC USING PREVENA INCISIONAL  DRESSING;  Surgeon: Wallace Going, DO;  Location: [place];  Service: Plastics;  Laterality: N/A;   AV FISTULA PLACEMENT Left 09/18/2020   Procedure: ARTERIOVENOUS (AV) FISTULA CREATION LEFT VERSUS GRAFT;  Surgeon: Serafina Mitchell, MD;  Location: [place];  Service: Vascular;  Laterality: Left;   [place] Left 10/31/2020   Procedure: LEFT SECOND STAGE [place];  Surgeon: Serafina Mitchell, MD;  [facility];  Service: Vascular;  Laterality: Left;   CARDIAC CATHETERIZATION     CORONARY ARTERY BYPASS GRAFT N/A 08/15/2019   Procedure: CORONARY ARTERY BYPASS GRAFTING (CABG), x 4 using bilateral IMAs and left radial artery .  LIMA TO LAD, RIMA TO PDA, RADIAL ARTERY TO CIRC AND SEQUENTIALLY TO OM1.;  Surgeon: [name], MD;  Location: [place];  Service: Open Heart Surgery;  Laterality: N/A;   CORONARY STENT PLACEMENT  02/27/2014   distal rt/pd coronary       dr Einar Gip   CYSTO/ BLADDER BIOPSY'S/ CAUTHERIZATION  01-14-2004  DR Gaynelle Arabian   EXPLORATION POST OPERATIVE OPEN HEART N/A 08/16/2019   Procedure: Chest Closure S?P CABG WITH APPLICATION OF PREVENA  INCISIONAL WOUND VAC;  Surgeon: [name], MD;  Location: [place];  Service: Open Heart Surgery;  Laterality: N/A;   EXPLORATION POST OPERATIVE OPEN HEART N/A 08/21/2019   Procedure: CHEST WASHOUT S/P OPEN CHEST;  Surgeon: [name], MD;  Location: [place];  Service: Open Heart Surgery;  Laterality: N/A;  Open chest with Esmark dressing with Ioban sealant coverage.   EXPLORATION POST OPERATIVE OPEN HEART N/A 08/18/2019   Procedure: EXPLORATION POST OPERATIVE OPEN HEART (performed 04/23 on unit);  Surgeon: [name], MD;  Location: [place];  Service: Open Heart Surgery;  Laterality: N/A;   EXPLORATION POST OPERATIVE OPEN HEART N/A 08/24/2019   Procedure: CHEST WASHOUT POST OPERATIVE OPEN HEART;  Surgeon: [name], MD;  Location: [place];  Service: Open Heart Surgery;  Laterality: N/A;   EXPLORATION POST OPERATIVE OPEN HEART N/A 08/29/2019   Procedure: CHEST WOUND WASHOUT POST OPERATIVE OPEN HEART;  Surgeon: [name], MD;  Location: [place];  Service: Open Heart Surgery;  Laterality: N/A;   EXPLORATION POST OPERATIVE OPEN HEART N/A 09/04/2019   Procedure: MEDIASTINAL EXPLORATION WITH STERNAL WOUND IRRIGATION;  Surgeon: Grace Raydon, MD;  Location: [place];  Service: Open Heart Surgery;  Laterality:  N/A;   EXPLORATION POST OPERATIVE OPEN HEART N/A 09/14/2019   Procedure: EVACUATION OF HEMATOMA;  Surgeon: Grace Radek, MD;  Location: [place];  Service: Open Heart Surgery;  Laterality: N/A;   IR FLUORO GUIDE CV LINE LEFT  09/19/2019   IR GASTROSTOMY TUBE MOD SED  10/04/2019   IR GASTROSTOMY TUBE REMOVAL  11/29/2019   IR PATIENT EVAL TECH 0-60 MINS  09/29/2019   IR US GUIDE VASC ACCESS LEFT  09/19/2019   LAPAROSCOPIC LYSIS OF ADHESIONS N/A 09/06/2019   Procedure: LAPAROSCOPIC OMENTAL HARVEST;  Surgeon: Mickeal Skinner, MD;  Location: [place];  Service: General;  Laterality: N/A;   LEFT HEART CATH AND CORONARY ANGIOGRAPHY N/A 08/10/2019   Procedure: LEFT HEART CATH AND CORONARY ANGIOGRAPHY;  Surgeon: Nigel Mormon, MD;  Location: [place] [facility];  Service: Cardiovascular;  Laterality: N/A;   LEFT HEART CATHETERIZATION WITH CORONARY ANGIOGRAM N/A 02/27/2014   Procedure: LEFT HEART CATHETERIZATION WITH CORONARY ANGIOGRAM;  Surgeon: Laverda Page, MD;  Location:  [place] [facility];  Service: Cardiovascular;  Laterality: N/A;   MEDIASTINAL EXPLORATION N/A 09/06/2019   Procedure: MEDIASTINAL EXPLORATION;  Surgeon: Grace Ruvim, MD;  Location: [place];  Service: Thoracic;  Laterality: N/A;   PECTORALIS FLAP  09/06/2019   Procedure: Pectoralis ADVANCEMENT Flap;  Surgeon: Wallace Going, DO;  Location: [place];  Service: Plastics;;   PERCUTANEOUS CORONARY STENT INTERVENTION (PCI-S)  02/27/2014   Procedure: PERCUTANEOUS CORONARY STENT INTERVENTION (PCI-S);  Surgeon: Laverda Page, MD;  [facility];  Service: Cardiovascular;;  rt PDA  3.0/28mm Promus stent   RADIAL ARTERY HARVEST Left 08/15/2019   Procedure: Radial Artery Harvest;  Surgeon: [name], MD;  Location: [place];  Service: Open Heart Surgery;  Laterality: Left;   RIB PLATING N/A 09/06/2019   Procedure: STERNAL PLATING;  Surgeon: Grace Ondra, MD;  Location: [place];  Service: Thoracic;  Laterality: N/A;   TEE  WITHOUT CARDIOVERSION N/A 08/15/2019   Procedure: TRANSESOPHAGEAL ECHOCARDIOGRAM (TEE);  Surgeon: [name], MD;  Location: [place];  Service: Open Heart Surgery;  Laterality: N/A;   TRACHEOSTOMY TUBE PLACEMENT  08/29/2019   Procedure: Tracheostomy;  Surgeon: [name], MD;  [facility];  Service: Open Heart Surgery; Decannulated 6/10/20211   TRANSURETHRAL RESECTION OF PROSTATE  04/04/2012   Procedure: TRANSURETHRAL RESECTION OF THE PROSTATE WITH GYRUS INSTRUMENTS;  Surgeon: Ailene Rud, MD;  Location[facility];  Service: Urology;  Laterality: N/A;   TRANSURETHRAL RESECTION OF PROSTATE N/A 09/27/2014   Procedure: TRANSURETHRAL RESECTION OF THE PROSTATE ;  Surgeon: Carolan Clines, MD;  Location: [facility];  Service: Urology;  Laterality: N/A;   UPPER GASTROINTESTINAL ENDOSCOPY     Current Outpatient Medications on File Prior to Visit  Medication Sig Dispense Refill   albuterol (VENTOLIN HFA) 108 (90 Base) MCG/ACT inhaler TAKE 2 PUFFS BY MOUTH EVERY 6 HOURS AS NEEDED FOR WHEEZE OR SHORTNESS OF BREATH 8.5 each 3   aspirin EC 81 MG tablet Take 81 mg by mouth daily.     atorvastatin (LIPITOR) 20 MG tablet Take 1 tablet (20 mg total) by mouth every evening. 90 tablet 3   clonazePAM (KLONOPIN) 0.5 MG disintegrating tablet TAKE 1 TABLET BY MOUTH TWICE A DAY 60 tablet 5   clopidogrel (PLAVIX) 75 MG tablet TAKE 1 TABLET BY MOUTH EVERY DAY (Patient taking differently: Take 75 mg by mouth daily.) 90 tablet 1   cromolyn (OPTICROM) 4 % ophthalmic solution Place 1 drop into both eyes every Wednesday. At bedtime     [facility]S ACETAMINOPHEN 325 MG tablet TAKE 2 CAPS BY MOUTH EVERY 6 HOURS AS NEEDED FOR PAIN FOR TEMP 100F OR ABOVE (Patient taking differently: Take 325 mg by mouth every 6 (six) hours as needed for mild pain or headache (temp over 100).) 60 tablet 0   [facility]S SENNA PLUS 8.6-50 MG tablet TAKE 2 TABLETS BY MOUTH AT BEDTIME FOR CONSTIPATION 60 tablet 1   ferrous sulfate  325 (65 FE) MG tablet Take 325 mg by mouth daily with breakfast.     furosemide (LASIX) 80 MG tablet Take 120 mg by mouth 2 (two) times daily.     hydrALAZINE (APRESOLINE) 100 MG tablet Take 1 tablet (100 mg total) by mouth 3 (three) times daily. 270 tablet 3   isosorbide dinitrate (ISORDIL) 30 MG tablet TAKE 1 TABLET BY MOUTH THREE TIMES A DAY (Patient taking differently: Take 30 mg by mouth 2 (two) times daily.) 270 tablet 1  metoprolol succinate (TOPROL-XL) 50 MG 24 hr tablet TAKE 3 TABLETS BY MOUTH EVERY DAY 90 tablet 1   mometasone-formoterol (DULERA) 100-5 MCG/ACT AERO Inhale 2 puffs into the lungs 2 (two) times daily. 1 each 0   Multiple Vitamin (MULTIVITAMIN) tablet Take 1 tablet by mouth daily.     nitroGLYCERIN (NITROSTAT) 0.4 MG SL tablet Place 0.4 mg under the tongue every 5 (five) minutes as needed for chest pain.     oxyCODONE-acetaminophen (PERCOCET) 5-325 MG tablet Take 1 tablet by mouth every 6 (six) hours as needed for severe pain. 20 tablet 0   potassium chloride (KLOR-CON) 10 MEQ tablet Take 10 mEq by mouth daily.     PREVIDENT 5000 BOOSTER PLUS 1.1 % PSTE Take 1 application by mouth every evening.     sertraline (ZOLOFT) 25 MG tablet TAKE 1 TABLET ONE TIME A DAY 90 tablet 1   silodosin (RAPAFLO) 8 MG CAPS capsule TAKE 1 CAPSULE BY MOUTH EVERYDAY AT BEDTIME (Patient taking differently: Take 8 mg by mouth at bedtime.) 90 capsule 3   Vitamin D, Ergocalciferol, (DRISDOL) 1.25 MG (50000 UNIT) CAPS capsule TAKE 1 CAPSULE (50,000 UNITS TOTAL) BY MOUTH EVERY 7 (SEVEN) DAYS 12 capsule 3   No current facility-administered medications on file prior to visit.    No Known Allergies Social History   Occupational History   Occupation: Retired  Tobacco Use   Smoking status: Former    Packs/day: 0.50    Years: 20.00    Pack years: 10.00    Types: Cigarettes    Quit date: 1975    Years since quitting: 48.1   Smokeless tobacco: Never  Vaping Use   Vaping Use: Never used  Substance  and Sexual Activity   Alcohol use: Not Currently    Comment: very rarely - every now and then 1 drink/1 year if that   Drug use: No   Sexual activity: Not Currently   Family History  Problem Relation Age of Onset   Diabetes Mother    Hypertension Mother    Diabetes Father    Diabetes Brother    Hypertension Brother    Bone cancer Brother    Diabetes Brother    Immunization History  Administered Date(s) Administered   Fluad Quad(high Dose 65+) 01/09/2019   Influenza, High Dose Seasonal PF 02/08/2017, 01/17/2018, 01/30/2020   Influenza,inj,Quad PF,6+ Mos 02/28/2014, 02/11/2015   Influenza-Unspecified 03/11/2017   PFIZER(Purple Top)SARS-COV-2 Vaccination 06/01/2019, 06/22/2019, 01/18/2020, 12/06/2020   Pneumococcal Conjugate-13 08/13/2015, 01/09/2019   Pneumococcal Polysaccharide-23 02/28/2014   Tdap 08/13/2015     Review of Systems: Negative except as noted in the HPI.   Objective: There were no vitals filed for this visit.  Christian [name] is a pleasant 76 y.o. male in NAD. AAO X 3.  Vascular Examination: CFT <3 seconds b/l LE. Palpable DP pulse(s) b/l LE. Faintly palpable PT pulse(s) b/l LE. Pedal hair absent. No pain with calf compression b/l. Lower extremity skin temperature gradient within normal limits. Nonpitting edema noted BLE. No ischemia or gangrene noted b/l LE. No cyanosis or clubbing noted b/l LE.  Dermatological Examination: Pedal integument with normal turgor, texture and tone BLE. No open wounds b/l LE. No interdigital macerations noted b/l LE. Toenails 1-5 b/l elongated, discolored, dystrophic, thickened, crumbly with subungual debris and tenderness to dorsal palpation. No hyperkeratotic nor porokeratotic lesions present on today's visit.  Musculoskeletal Examination: Muscle strength 5/5 to all lower extremity muscle groups bilaterally. Pes planus deformity noted bilateral LE. Utilizes cane for  ambulation assistance.  Footwear Assessment: Does the patient  wear appropriate shoes? Yes. Does the patient need inserts/orthotics? Yes.  Neurological Examination: Protective sensation intact with 10 gram monofilament right lower extremity. Protective sensation decreased with 10 gram monofilament left lower extremity. Vibratory sensation intact right lower extremity. Vibratory sensation diminished left lower extremity.  Assessment: 1. Pain due to onychomycosis of toenails of both feet   2. Diabetic polyneuropathy associated with type 2 diabetes mellitus ([place])   3. MULTIPLE SCLEROSIS   4. Pes planus of both feet   5. Encounter for diabetic foot exam ([place])     ADA Risk Categorization: High Risk  Patient has one or more of the following: Loss of protective sensation Absent pedal pulses Severe Foot deformity History of foot ulcer  Plan: -Diabetic foot examination performed today. -Continue foot and shoe inspections daily. Monitor blood glucose per PCP/Endocrinologist's recommendations. -Mycotic toenails 1-5 bilaterally were debrided in length and girth with sterile nail nippers and dremel without incident. -Patient/POA to call should there be question/concern in the interim.  Return in about 3 months (around 08/25/2021) for nail trim.  Marzetta Board, DPM

## 2021-06-04 ENCOUNTER — Other Ambulatory Visit: Payer: Self-pay | Admitting: Student

## 2021-06-04 ENCOUNTER — Other Ambulatory Visit: Payer: Self-pay | Admitting: Cardiology

## 2021-06-04 DIAGNOSIS — I119 Hypertensive heart disease without heart failure: Secondary | ICD-10-CM

## 2021-06-04 DIAGNOSIS — I25118 Atherosclerotic heart disease of native coronary artery with other forms of angina pectoris: Secondary | ICD-10-CM

## 2021-06-15 ENCOUNTER — Other Ambulatory Visit: Payer: Self-pay | Admitting: Student

## 2021-06-18 ENCOUNTER — Other Ambulatory Visit: Payer: Self-pay | Admitting: Internal Medicine

## 2021-06-25 ENCOUNTER — Ambulatory Visit (INDEPENDENT_AMBULATORY_CARE_PROVIDER_SITE_OTHER): Payer: Medicare Other | Admitting: Pulmonary Disease

## 2021-06-25 ENCOUNTER — Encounter: Payer: Self-pay | Admitting: Pulmonary Disease

## 2021-06-25 ENCOUNTER — Other Ambulatory Visit: Payer: Self-pay

## 2021-06-25 VITALS — BP 128/84 | HR 91 | Ht 69.0 in | Wt 213.0 lb

## 2021-06-25 DIAGNOSIS — R0602 Shortness of breath: Secondary | ICD-10-CM

## 2021-06-25 DIAGNOSIS — J90 Pleural effusion, not elsewhere classified: Secondary | ICD-10-CM

## 2021-06-25 DIAGNOSIS — J683 Other acute and subacute respiratory conditions due to chemicals, gases, fumes and vapors: Secondary | ICD-10-CM | POA: Diagnosis not present

## 2021-06-25 DIAGNOSIS — R0982 Postnasal drip: Secondary | ICD-10-CM

## 2021-06-25 DIAGNOSIS — J40 Bronchitis, not specified as acute or chronic: Secondary | ICD-10-CM | POA: Diagnosis not present

## 2021-06-25 LAB — PULMONARY FUNCTION TEST
DL/VA % pred: 134 %
DL/VA: 5.36 ml/min/mmHg/L
DLCO cor % pred: 63 %
DLCO cor: 15.52 ml/min/mmHg
DLCO unc % pred: 57 %
DLCO unc: 14.2 ml/min/mmHg
FEF 25-75 Post: 2.23 L/sec
FEF 25-75 Pre: 1.96 L/sec
FEF2575-%Change-Post: 13 %
FEF2575-%Pred-Post: 100 %
FEF2575-%Pred-Pre: 88 %
FEV1-%Change-Post: 0 %
FEV1-%Pred-Post: 50 %
FEV1-%Pred-Pre: 51 %
FEV1-Post: 1.36 L
FEV1-Pre: 1.37 L
FEV1FVC-%Change-Post: 0 %
FEV1FVC-%Pred-Pre: 115 %
FEV6-%Change-Post: 0 %
FEV6-%Pred-Post: 45 %
FEV6-%Pred-Pre: 45 %
FEV6-Post: 1.56 L
FEV6-Pre: 1.56 L
FEV6FVC-%Pred-Post: 105 %
FEV6FVC-%Pred-Pre: 105 %
FVC-%Change-Post: 0 %
FVC-%Pred-Post: 43 %
FVC-%Pred-Pre: 43 %
FVC-Post: 1.56 L
FVC-Pre: 1.56 L
Post FEV1/FVC ratio: 88 %
Post FEV6/FVC ratio: 100 %
Pre FEV1/FVC ratio: 88 %
Pre FEV6/FVC Ratio: 100 %
RV % pred: 65 %
RV: 1.61 L
TLC % pred: 48 %
TLC: 3.32 L

## 2021-06-25 MED ORDER — AZITHROMYCIN 250 MG PO TABS: ORAL_TABLET | ORAL | 0 refills | Status: DC

## 2021-06-25 MED ORDER — FLUTICASONE PROPIONATE 50 MCG/ACT NA SUSP: 1.0000 | Freq: Every day | NASAL | 2 refills | Status: DC

## 2021-06-25 NOTE — Patient Instructions (Signed)
Full PFT performed today. °

## 2021-06-25 NOTE — Progress Notes (Signed)
Synopsis: Referred in January 2022 for shortness of breath by Christian Cruel, PA  Subjective:   PATIENT ID: Christian [name] GENDER: male DOB: 20-Jun-1945, MRN: [id number]  HPI  Chief Complaint  Patient presents with   Follow-up    F/U after PFT. States he has noticed an increase in SOB.    Christian [name] is a 76 year old male, former smoker with hypertension, CAD s/p CABG 2021, congestive heart failure and pulmonary embolism in 2021 who returns to pulmonary clinic for shortness of breath.   He was started on symbicort 160-4.5mcg 2 puffs twice daily at last visit without much improvement. He continues to experience intermittent wheezing. He reports having cold like symptoms over the past week with increased sinus congestion, drainage, cough and wheezing. He denies fevers and sweats but is having some chills. He has some central chest discomfort.  Chest radiograph from last visit shows persistent left loculated pleural effusion.   PFTs today show moderate restriction and moderate diffusion defect.   OV 05/22/21 Cardiology note reviewed from 05/08/21. He had extremely complex postop CABG complications including cardiogenic shock, cardiac arrest, needing internal CPR in the ICU after opening the chest, A. Fib needing cardioversion, closure of chest wall, developed acute renal failure leading temporary need for dialysis, right brain infarct with left hemiparesis, and multiple blood transfusions. Echocardiogram 11/2020 revealed mildly reduced LVEF at 45-50%, therefore patient underwent stress test which was overall low risk.    He was noted to have left loculated pleural effusion based on chest imaging after his prolonged hospitalization. He reports having migratory chest discomforts that can be on the right side of his chest and move to the middle of his chest. He also experiences intermittent chest tightness. He has intermittent wheezing.   He reports trouble swallowing since his  hospitalization in 2021. His wife reports he will eat reclined in his chair at times and have increased coughing when eating.   He reports second hand smoke exposure from his grandfather in childhood. He smoked for 3-4 years when he was in the [employer]. He worked in a [employer] where he was exposed to second hand smoke and he later worked for the post [employer].   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  LIMA TO LAD, RIMA TO PDA, RADIAL ARTERY TO CIRC AND SEQUENTIALLY TO OM1. 08/15/2019   Hyperlipidemia    Hypertension    Incomplete bladder emptying    Myocardial infarction ([place]) 08/09/2019   Nocturia    PE (pulmonary thromboembolism) ([place]) 08/09/2019   small RLL PE 08/09/19   Pneumonia    as a child   Pre-diabetes    Problems with swallowing pt states test at [facility] approx 2012 shows a gastric valve  dysfunction--  eats small bites and drink liquids slowly   SOB (shortness of breath) on exertion      Family History  Problem Relation Age of Onset   Diabetes Mother    Hypertension Mother    Diabetes Father    Diabetes Brother    Hypertension Brother    Bone cancer Brother    Diabetes Brother  Social History   Socioeconomic History   Marital status: Married    Spouse name: Not on file   Number of children: 2   Years of education: Not on file   Highest education level: Associate degree: academic program  Occupational History   Occupation: Retired  Tobacco Use   Smoking status: Former    Packs/day: 0.50    Years: 20.00    Pack years: 10.00    Types: Cigarettes    Quit date: 1975    Years since quitting: 48.1   Smokeless tobacco: Never  Vaping Use   Vaping Use: Never used  Substance and Sexual Activity    Alcohol use: Not Currently    Comment: very rarely - every now and then 1 drink/1 year if that   Drug use: No   Sexual activity: Not Currently  Other Topics Concern   Not on file  Social History Narrative   Not on file   Social Determinants of Health   Financial Resource Strain: Not on file  Food Insecurity: Not on file  Transportation Needs: Not on file  Physical Activity: Not on file  Stress: Not on file  Social Connections: Not on file  Intimate Partner Violence: Not on file     No Known Allergies   Outpatient Medications Prior to Visit  Medication Sig Dispense Refill   albuterol (VENTOLIN HFA) 108 (90 Base) MCG/ACT inhaler TAKE 2 PUFFS BY MOUTH EVERY 6 HOURS AS NEEDED FOR WHEEZE OR SHORTNESS OF BREATH 8.5 each 3   aspirin EC 81 MG tablet Take 81 mg by mouth daily.     atorvastatin (LIPITOR) 20 MG tablet Take 1 tablet (20 mg total) by mouth every evening. 90 tablet 3   budesonide-formoterol (SYMBICORT) 160-4.5 MCG/ACT inhaler Inhale 2 puffs into the lungs 2 (two) times daily. 1 each 6   clonazePAM (KLONOPIN) 0.5 MG disintegrating tablet TAKE 1 TABLET BY MOUTH TWICE A DAY 60 tablet 5   clopidogrel (PLAVIX) 75 MG tablet TAKE 1 TABLET BY MOUTH EVERY DAY 90 tablet 1   cromolyn (OPTICROM) 4 % ophthalmic solution Place 1 drop into both eyes every Wednesday. At bedtime     [employer] ACETAMINOPHEN 325 MG tablet TAKE 2 CAPS BY MOUTH EVERY 6 HOURS AS NEEDED FOR PAIN FOR TEMP 100F OR ABOVE (Patient taking differently: Take 325 mg by mouth every 6 (six) hours as needed for mild pain or headache (temp over 100).) 60 tablet 0   [employer] STOOL SOFTENER/LAXATIVE 8.6-50 MG tablet TAKE 2 TABLETS BY MOUTH AT BEDTIME FOR CONSTIPATION 60 tablet 1   ferrous sulfate 325 (65 FE) MG tablet Take 325 mg by mouth daily with breakfast.     furosemide (LASIX) 80 MG tablet Take 120 mg by mouth 2 (two) times daily.     hydrALAZINE (APRESOLINE) 100 MG tablet Take 1 tablet (100 mg total) by mouth 3 (three) times daily. 270  tablet 3   isosorbide dinitrate (ISORDIL) 30 MG tablet TAKE 1 TABLET BY MOUTH THREE TIMES A DAY 270 tablet 1   metoprolol succinate (TOPROL-XL) 50 MG 24 hr tablet TAKE 3 TABLETS BY MOUTH EVERY DAY 90 tablet 1   Multiple Vitamin (MULTIVITAMIN) tablet Take 1 tablet by mouth daily.     nitroGLYCERIN (NITROSTAT) 0.4 MG SL tablet Place 0.4 mg under the tongue every 5 (five) minutes as needed for chest pain.     oxyCODONE-acetaminophen (PERCOCET) 5-325 MG tablet Take 1 tablet by mouth every 6 (six) hours as needed for severe pain.  20 tablet 0   potassium chloride (KLOR-CON) 10 MEQ tablet Take 10 mEq by mouth daily.     PREVIDENT 5000 BOOSTER PLUS 1.1 % PSTE Take 1 application by mouth every evening.     sertraline (ZOLOFT) 25 MG tablet TAKE 1 TABLET ONE TIME A DAY 90 tablet 1   silodosin (RAPAFLO) 8 MG CAPS capsule TAKE 1 CAPSULE BY MOUTH EVERYDAY AT BEDTIME (Patient taking differently: Take 8 mg by mouth at bedtime.) 90 capsule 3   Vitamin D, Ergocalciferol, (DRISDOL) 1.25 MG (50000 UNIT) CAPS capsule TAKE 1 CAPSULE (50,000 UNITS TOTAL) BY MOUTH EVERY 7 (SEVEN) DAYS 12 capsule 3   mometasone-formoterol (DULERA) 100-5 MCG/ACT AERO Inhale 2 puffs into the lungs 2 (two) times daily. 1 each 0   No facility-administered medications prior to visit.   Review of Systems  Constitutional:  Negative for chills, fever, malaise/fatigue and weight loss.  HENT:  Negative for congestion, sinus pain and sore throat.   Eyes: Negative.   Respiratory:  Positive for cough, shortness of breath and wheezing. Negative for hemoptysis and sputum production.   Cardiovascular:  Positive for chest pain. Negative for palpitations, orthopnea, claudication and leg swelling.  Gastrointestinal:  Negative for abdominal pain, heartburn, nausea and vomiting.  Genitourinary: Negative.   Musculoskeletal:  Negative for joint pain and myalgias.  Skin:  Negative for rash.  Neurological:  Negative for weakness.  Endo/Heme/Allergies:  Negative.   Psychiatric/Behavioral: Negative.     Objective:   Vitals:   06/25/21 1204  BP: 128/84  Pulse: 91  SpO2: 98%  Weight: 213 lb (96.6 kg)  Height: 5\' 9"  (1.753 m)     Physical Exam Constitutional:      General: He is not in acute distress.    Appearance: He is obese.  HENT:     Head: Normocephalic and atraumatic.  Eyes:     Extraocular Movements: Extraocular movements intact.     Conjunctiva/sclera: Conjunctivae normal.     Pupils: Pupils are equal, round, and reactive to light.  Cardiovascular:     Rate and Rhythm: Normal rate and regular rhythm.     Pulses: Normal pulses.     Heart sounds: Normal heart sounds. No murmur heard. Pulmonary:     Breath sounds: Examination of the left-lower field reveals decreased breath sounds. Decreased breath sounds present. No wheezing, rhonchi or rales.  Abdominal:     General: Bowel sounds are normal.     Palpations: Abdomen is soft.  Musculoskeletal:     Right lower leg: No edema.     Left lower leg: No edema.  Lymphadenopathy:     Cervical: No cervical adenopathy.  Skin:    General: Skin is warm and dry.  Neurological:     General: No focal deficit present.     Mental Status: He is alert.  Psychiatric:        Mood and Affect: Mood normal.        Behavior: Behavior normal.        Thought Content: Thought content normal.        Judgment: Judgment normal.    CBC    Component Value Date/Time   WBC 8.0 05/08/2021 1201   WBC 8.4 12/12/2020 0336   RBC 3.68 (L) 05/08/2021 1201   RBC 3.55 (L) 12/12/2020 0336   HGB 11.9 (L) 05/08/2021 1201   HCT 35.7 (L) 05/08/2021 1201   PLT 205 05/08/2021 1201   MCV 97 05/08/2021 1201   MCH 32.3 05/08/2021 1201  MCH 33.0 12/12/2020 0336   MCHC 33.3 05/08/2021 1201   MCHC 30.7 12/12/2020 0336   RDW 13.6 05/08/2021 1201   LYMPHSABS 1.6 12/11/2020 1533   MONOABS 0.8 12/11/2020 1533   EOSABS 0.1 12/11/2020 1533   BASOSABS 0.1 12/11/2020 1533   BMP Latest Ref Rng & Units  05/22/2021 12/16/2020 12/15/2020  Glucose 70 - 99 mg/dL 177(H) 127(H) 204(H)  BUN 8 - 27 mg/dL 35(H) 38(H) 40(H)  Creatinine 0.76 - 1.27 mg/dL 2.73(H) 2.52(H) 2.76(H)  BUN/Creat Ratio 10 - 24 13 - -  Sodium 134 - 144 mmol/L 146(H) 139 138  Potassium 3.5 - 5.2 mmol/L 3.9 3.9 3.6  Chloride 96 - 106 mmol/L 105 105 106  CO2 20 - 29 mmol/L 24 23 25   Calcium 8.6 - 10.2 mg/dL 9.1 8.9 8.6(L)   Chest imaging: CXR 05/22/21 Unchanged appearance of the chest x-ray from the prior, with chronic left pleural thickening/fluid and no definite evidence of acute cardiopulmonary disease.  CXR 12/23/20 Persistent and unchanged loculated parapneumonic effusion with associated left basilar atelectasis versus infiltrate. Stable cardiomegaly. Patient is status post median sternotomy with evidence of prior multivessel CABG. The right lung remains clear. No acute osseous abnormality.  CT Chest wo contrast 12/12/20 1. Cluster of ground-glass nodular density in the right lower lobe most consistent with atypical pneumonia. Small, chronic appearing bilateral pleural effusions, left greater than right. 2. Partial consolidative changes of the left lung base. 3. Aortic Atherosclerosis   PFT: PFT Results Latest Ref Rng & Units 06/25/2021 08/11/2019  FVC-Pre L 1.56 1.83  FVC-Predicted Pre % 43 50  FVC-Post L 1.56 -  FVC-Predicted Post % 43 -  Pre FEV1/FVC % % 88 81  Post FEV1/FCV % % 88 -  FEV1-Pre L 1.37 1.48  FEV1-Predicted Pre % 51 54  FEV1-Post L 1.36 -  DLCO uncorrected ml/min/mmHg 14.20 -  DLCO UNC% % 57 -  DLCO corrected ml/min/mmHg 15.52 -  DLCO COR %Predicted % 63 -  DLVA Predicted % 134 -  TLC L 3.32 -  TLC % Predicted % 48 -  RV % Predicted % 65 -  PFT 2023: Moderate restriction and moderate diffusion  Labs:  Path:  Echo 12/12/20: LV EF 45-50%. RV function and size is normal.   Heart Catheterization 08/10/19: LM: Normal LAD: Ostial 95% stenosis Ramus: Ostial/prox 30% stenosis LCx: Ostial  75% stenosis RCA: Prox-mid diffuse 40% calcific disease         Distal RCA 70% ISR   LVEDP 17 mmHg  NM Cardiac Stress Test 01/20/21 Lexiscan nuclear stress test performed using 1-day protocol. Normal myocardial perfusion. Stress LVEF calculated 49%, although visually appears normal. Low risk study.  Assessment & Plan:   Bronchitis - Plan: azithromycin (ZITHROMAX) 250 MG tablet  Reactive airways dysfunction syndrome (HCC)  Pleural effusion  Post-nasal drip - Plan: fluticasone (FLONASE) 50 MCG/ACT nasal spray  Discussion: Christian [name] is a 76 year old male, former smoker with hypertension, CAD s/p CABG 2021, congestive heart failure and pulmonary embolism in 2021 who returns to pulmonary clinic for shortness of breath.   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   He is to continue ICS/LABA therapy with symbicort 160-4.82mcg 2 puffs twice daily and continue as needed albuterol. We reviewed inhaler technique today and will provide him with a spacer.   He is to start Zpak for concern of sinusitis  and bronchitis. He is to use flonase nasal spray, 2 sprays per nostril daily for sinus congestion and drainage.   Follow up in 6 months.   Freda Jackson, MD [facility] Office: [phone]   Current Outpatient Medications:    albuterol (VENTOLIN HFA) 108 (90 Base) MCG/ACT inhaler, TAKE 2 PUFFS BY MOUTH EVERY 6 HOURS AS NEEDED FOR WHEEZE OR SHORTNESS OF BREATH, Disp: 8.5 each, Rfl: 3   aspirin EC 81 MG tablet, Take 81 mg by mouth daily., Disp: , Rfl:    atorvastatin (LIPITOR) 20 MG tablet, Take 1 tablet (20 mg total) by mouth every evening., Disp: 90 tablet, Rfl: 3   azithromycin (ZITHROMAX) 250 MG tablet, Take as directed, Disp: 6 tablet, Rfl: 0   budesonide-formoterol (SYMBICORT) 160-4.5 MCG/ACT inhaler, Inhale 2 puffs into the lungs  2 (two) times daily., Disp: 1 each, Rfl: 6   clonazePAM (KLONOPIN) 0.5 MG disintegrating tablet, TAKE 1 TABLET BY MOUTH TWICE A DAY, Disp: 60 tablet, Rfl: 5   clopidogrel (PLAVIX) 75 MG tablet, TAKE 1 TABLET BY MOUTH EVERY DAY, Disp: 90 tablet, Rfl: 1   cromolyn (OPTICROM) 4 % ophthalmic solution, Place 1 drop into both eyes every Wednesday. At bedtime, Disp: , Rfl:    [employer]S ACETAMINOPHEN 325 MG tablet, TAKE 2 CAPS BY MOUTH EVERY 6 HOURS AS NEEDED FOR PAIN FOR TEMP 100F OR ABOVE (Patient taking differently: Take 325 mg by mouth every 6 (six) hours as needed for mild pain or headache (temp over 100).), Disp: 60 tablet, Rfl: 0   CVS STOOL SOFTENER/LAXATIVE 8.6-50 MG tablet, TAKE 2 TABLETS BY MOUTH AT BEDTIME FOR CONSTIPATION, Disp: 60 tablet, Rfl: 1   ferrous sulfate 325 (65 FE) MG tablet, Take 325 mg by mouth daily with breakfast., Disp: , Rfl:    fluticasone (FLONASE) 50 MCG/ACT nasal spray, Place 1 spray into both nostrils daily., Disp: 16 g, Rfl: 2   furosemide (LASIX) 80 MG tablet, Take 120 mg by mouth 2 (two) times daily., Disp: , Rfl:    hydrALAZINE (APRESOLINE) 100 MG tablet, Take 1 tablet (100 mg total) by mouth 3 (three) times daily., Disp: 270 tablet, Rfl: 3   isosorbide dinitrate (ISORDIL) 30 MG tablet, TAKE 1 TABLET BY MOUTH THREE TIMES A DAY, Disp: 270 tablet, Rfl: 1   metoprolol succinate (TOPROL-XL) 50 MG 24 hr tablet, TAKE 3 TABLETS BY MOUTH EVERY DAY, Disp: 90 tablet, Rfl: 1   Multiple Vitamin (MULTIVITAMIN) tablet, Take 1 tablet by mouth daily., Disp: , Rfl:    nitroGLYCERIN (NITROSTAT) 0.4 MG SL tablet, Place 0.4 mg under the tongue every 5 (five) minutes as needed for chest pain., Disp: , Rfl:    oxyCODONE-acetaminophen (PERCOCET) 5-325 MG tablet, Take 1 tablet by mouth every 6 (six) hours as needed for severe pain., Disp: 20 tablet, Rfl: 0   potassium chloride (KLOR-CON) 10 MEQ tablet, Take 10 mEq by mouth daily., Disp: , Rfl:    PREVIDENT 5000 BOOSTER PLUS 1.1 % PSTE, Take 1  application by mouth every evening., Disp: , Rfl:    sertraline (ZOLOFT) 25 MG tablet, TAKE 1 TABLET ONE TIME A DAY, Disp: 90 tablet, Rfl: 1   silodosin (RAPAFLO) 8 MG CAPS capsule, TAKE 1 CAPSULE BY MOUTH EVERYDAY AT BEDTIME (Patient taking differently: Take 8 mg by mouth at bedtime.), Disp: 90 capsule, Rfl: 3   Vitamin D, Ergocalciferol, (DRISDOL) 1.25 MG (50000 UNIT) CAPS capsule, TAKE 1 CAPSULE (50,000 UNITS TOTAL) BY MOUTH EVERY 7 (SEVEN) DAYS, Disp: 12  capsule, Rfl: 3

## 2021-06-25 NOTE — Patient Instructions (Addendum)
Continue symbicort 2 puffs twice a day ?- rinse mouth out after each use ? ?We will provide you with a spacer to try and use with the symbicort ? ?Use albuterol inhaler 1-2 breaths every 4-6 hours as needed ? ?Start azithromycin antibiotic for possible bronchitis ? ?Start flonase nasal spray, sprays per nostril daily for sinus congestion ? ? ? ? ? ? ?

## 2021-06-25 NOTE — Progress Notes (Signed)
Full PFT performed today. °

## 2021-07-02 NOTE — Progress Notes (Signed)
Primary Physician/Referring:  Hoyt Koch, MD  Patient ID: Christian [name], male    DOB: 1945-09-11, 76 y.o.   MRN: [id number]  Chief Complaint  Patient presents with   Follow-up    8 WEEKS   Coronary Artery Disease   HLD   HF   HPI:    Christian [name]  is a 76 y.o. AA male with hypertension, hyperlipidemia, DM with stage 3 CKD,  CAD with  stenting to the PDA on 02/27/2014. Patient presented with unstable angina and NSTEMI on 08/09/2019 along with a very small subsegmental pulmonary embolism and discharged on 10/18/2019 after he underwent CABG x4 on 08/15/2019.   He had extremely complex postop complications including cardiogenic shock, cardiac arrest, needing internal CPR in the ICU after opening the chest, A. Fib needing cardioversion, closure office chest wall, developed acute renal failure leading to need for permanent dialysis, right brain infarct with left hemiparesis, multiple blood transfusions.  He was also placed on dialysis however is now off of dialysis since 02/01/2020. Echocardiogram 11/2020 revealed mildly reduced LVEF at 45-50%, therefore patient underwent stress test which was overall low risk.    She presents for 102-monthfollow-up.  Last office visit patient's blood pressure was soft and heart rate elevated, likely secondary to ongoing diarrhea at that time.  Patient also underwent repeat echocardiogram which is unchanged compared to previous.  Last office visit patient was complaining of frequent loose stools which have resolved.  His fatigue is also improved.  He continues to have dyspnea, however he was recently seen by pulmonology and treated for bronchitis.  Overall patient states he is feeling relatively well with improvement of dyspnea since last office visit.  He admits to dietary noncompliance, with high sodium intake.  Denies palpitations, syncope, near syncope.  Denies orthopnea, PND.  Notably patient's diuretics are primarily managed by nephrology.  Past  Medical History:  Diagnosis Date   Anemia    low iron   BPH (benign prostatic hypertrophy)    CHF (congestive heart failure) (HCC)    Chronic kidney disease    Coronary artery disease    Dry eyes left   Hiatal hernia    Hx of CABG 08/15/2019: x 4 using bilateral IMAs and left radial artery .  LIMA TO LAD, RIMA TO PDA, RADIAL ARTERY TO CIRC AND SEQUENTIALLY TO OM1. 08/15/2019   Hyperlipidemia    Hypertension    Incomplete bladder emptying    Myocardial infarction ([facility] 08/09/2019   Nocturia    PE (pulmonary thromboembolism) ([facility] 08/09/2019   small RLL PE 08/09/19   Pneumonia    as a child   Pre-diabetes    Problems with swallowing pt states test at [facility] approx 2012 shows a gastric valve  dysfunction--  eats small bites and drink liquids slowly   SOB (shortness of breath) on exertion    Past Surgical History:  Procedure Laterality Date   APPLICATION OF WOUND VAC N/A 08/24/2019   Procedure: APPLICATION OF WOUND VAC;  Surgeon: AWonda Olds MD;  [place] [facility]  Service: Thoracic;  Laterality: N/A;   APPLICATION OF WOUND VAC  08/29/2019   Procedure: Wound Vac change;  Surgeon: AWonda Olds MD;  [facility];  Service: Open Heart Surgery;;   APPLICATION OF WOUND VAC N/A 09/04/2019   Procedure: Application Of Wound Vac;  Surgeon: GGrace Jomar MD;  [place] [facility]  Service: Open Heart Surgery;  Laterality: N/A;   APPLICATION OF WOUND VAC N/A  09/06/2019   Procedure: APPLICATION OF ACELL, APPLICATION OF WOUND VAC USING PREVENA INCISIONAL  DRESSING;  Surgeon: Wallace Going, DO;  Location: [place];  Service: Plastics;  Laterality: N/A;   AV FISTULA PLACEMENT Left 09/18/2020   Procedure: ARTERIOVENOUS (AV) FISTULA CREATION LEFT VERSUS GRAFT;  Surgeon: Serafina Mitchell, MD;  Location: [place];  Service: Vascular;  Laterality: Left;   Omega Left 10/31/2020   Procedure: LEFT SECOND STAGE [place];  Surgeon: Serafina Mitchell, MD;   [facility];  Service: Vascular;  Laterality: Left;   CARDIAC CATHETERIZATION     CORONARY ARTERY BYPASS GRAFT N/A 08/15/2019   Procedure: CORONARY ARTERY BYPASS GRAFTING (CABG), x 4 using bilateral IMAs and left radial artery .  LIMA TO LAD, RIMA TO PDA, RADIAL ARTERY TO CIRC AND SEQUENTIALLY TO OM1.;  Surgeon: [name], MD;  Location: [place];  Service: Open Heart Surgery;  Laterality: N/A;   CORONARY STENT PLACEMENT  02/27/2014   distal rt/pd coronary       dr Einar Gip   CYSTO/ BLADDER BIOPSY'S/ CAUTHERIZATION  01-14-2004  DR Gaynelle Arabian   EXPLORATION POST OPERATIVE OPEN HEART N/A 08/16/2019   Procedure: Chest Closure S?P CABG WITH APPLICATION OF PREVENA  INCISIONAL WOUND VAC;  Surgeon: [name], MD;  Location: [place];  Service: Open Heart Surgery;  Laterality: N/A;   EXPLORATION POST OPERATIVE OPEN HEART N/A 08/21/2019   Procedure: CHEST WASHOUT S/P OPEN CHEST;  Surgeon: [name], MD;  Location: [place];  Service: Open Heart Surgery;  Laterality: N/A;  Open chest with Esmark dressing with Ioban sealant coverage.   EXPLORATION POST OPERATIVE OPEN HEART N/A 08/18/2019   Procedure: EXPLORATION POST OPERATIVE OPEN HEART (performed 04/23 on unit);  Surgeon: [name], MD;  Location: [place];  Service: Open Heart Surgery;  Laterality: N/A;   EXPLORATION POST OPERATIVE OPEN HEART N/A 08/24/2019   Procedure: CHEST WASHOUT POST OPERATIVE OPEN HEART;  Surgeon: [name], MD;  Location: [place];  Service: Open Heart Surgery;  Laterality: N/A;   EXPLORATION POST OPERATIVE OPEN HEART N/A 08/29/2019   Procedure: CHEST WOUND WASHOUT POST OPERATIVE OPEN HEART;  Surgeon: [name], MD;  Location: [place];  Service: Open Heart Surgery;  Laterality: N/A;   EXPLORATION POST OPERATIVE OPEN HEART N/A 09/04/2019   Procedure: MEDIASTINAL EXPLORATION WITH STERNAL WOUND IRRIGATION;  Surgeon: Grace Zaevion, MD;  Location: [place];  Service: Open Heart Surgery;  Laterality: N/A;    EXPLORATION POST OPERATIVE OPEN HEART N/A 09/14/2019   Procedure: EVACUATION OF HEMATOMA;  Surgeon: Grace Tallis, MD;  Location: [place];  Service: Open Heart Surgery;  Laterality: N/A;   IR FLUORO GUIDE CV LINE LEFT  09/19/2019   IR GASTROSTOMY TUBE MOD SED  10/04/2019   IR GASTROSTOMY TUBE REMOVAL  11/29/2019   IR PATIENT EVAL TECH 0-60 MINS  09/29/2019   IR US GUIDE VASC ACCESS LEFT  09/19/2019   LAPAROSCOPIC LYSIS OF ADHESIONS N/A 09/06/2019   Procedure: LAPAROSCOPIC OMENTAL HARVEST;  Surgeon: Mickeal Skinner, MD;  Location: [place];  Service: General;  Laterality: N/A;   LEFT HEART CATH AND CORONARY ANGIOGRAPHY N/A 08/10/2019   Procedure: LEFT HEART CATH AND CORONARY ANGIOGRAPHY;  Surgeon: Nigel Mormon, MD;  Location: [place] [facility];  Service: Cardiovascular;  Laterality: N/A;   LEFT HEART CATHETERIZATION WITH CORONARY ANGIOGRAM N/A 02/27/2014   Procedure: LEFT HEART CATHETERIZATION WITH CORONARY ANGIOGRAM;  Surgeon: Turner Daniels  Einar Gip, MD;  Location: [place] [facility];  Service: Cardiovascular;  Laterality: N/A;   MEDIASTINAL EXPLORATION N/A 09/06/2019   Procedure: MEDIASTINAL EXPLORATION;  Surgeon: Grace Clemente, MD;  Location: [place];  Service: Thoracic;  Laterality: N/A;   PECTORALIS FLAP  09/06/2019   Procedure: Pectoralis ADVANCEMENT Flap;  Surgeon: Wallace Going, DO;  Location: [place];  Service: Plastics;;   PERCUTANEOUS CORONARY STENT INTERVENTION (PCI-S)  02/27/2014   Procedure: PERCUTANEOUS CORONARY STENT INTERVENTION (PCI-S);  Surgeon: Laverda Page, MD;  Location: [facility] CATH LAB;  Service: Cardiovascular;;  rt PDA  3.0/28mm Promus stent   RADIAL ARTERY HARVEST Left 08/15/2019   Procedure: Radial Artery Harvest;  Surgeon: [name], MD;  Location: [place];  Service: Open Heart Surgery;  Laterality: Left;   RIB PLATING N/A 09/06/2019   Procedure: STERNAL PLATING;  Surgeon: Grace Lamin, MD;  Location: [place];  Service: Thoracic;  Laterality: N/A;   TEE WITHOUT  CARDIOVERSION N/A 08/15/2019   Procedure: TRANSESOPHAGEAL ECHOCARDIOGRAM (TEE);  Surgeon: [name], MD;  Location: [place];  Service: Open Heart Surgery;  Laterality: N/A;   TRACHEOSTOMY TUBE PLACEMENT  08/29/2019   Procedure: Tracheostomy;  Surgeon: [name], MD;  [facility];  Service: Open Heart Surgery; Decannulated 6/10/20211   TRANSURETHRAL RESECTION OF PROSTATE  04/04/2012   Procedure: TRANSURETHRAL RESECTION OF THE PROSTATE WITH GYRUS INSTRUMENTS;  Surgeon: Ailene Rud, MD;  Location: [facility];  Service: Urology;  Laterality: N/A;   TRANSURETHRAL RESECTION OF PROSTATE N/A 09/27/2014   Procedure: TRANSURETHRAL RESECTION OF THE PROSTATE ;  Surgeon: Carolan Clines, MD;  [facility];  Service: Urology;  Laterality: N/A;   UPPER GASTROINTESTINAL ENDOSCOPY     Family History  Problem Relation Age of Onset   Diabetes Mother    Hypertension Mother    Diabetes Father    Diabetes Brother    Hypertension Brother    Bone cancer Brother    Diabetes Brother    Social History   Tobacco Use   Smoking status: Former    Packs/day: 0.50    Years: 20.00    Pack years: 10.00    Types: Cigarettes    Quit date: 1975    Years since quitting: 48.2   Smokeless tobacco: Never  Substance Use Topics   Alcohol use: Not Currently    Comment: very rarely - every now and then 1 drink/1 year if that   ROS   Review of Systems  Constitutional: Negative for malaise/fatigue (improved).  Cardiovascular:  Negative for chest pain, claudication, leg swelling, near-syncope, orthopnea, palpitations, paroxysmal nocturnal dyspnea and syncope.  Respiratory:  Positive for shortness of breath (improved).   Musculoskeletal:  Positive for joint pain.  Neurological:  Positive for disturbances in coordination (left sided weakness).  Objective  Blood pressure 132/77, pulse 93, temperature 97.9 F (36.6 C), temperature source Temporal, resp. rate 17, height '5\' 9"'$   (1.753 m), weight 218 lb 6.4 oz (99.1 kg), SpO2 100 %.  Vitals with BMI 07/03/2021 07/03/2021 06/25/2021  Height - '5\' 9"'$  '5\' 9"'$   Weight - 218 lbs 6 oz 213 lbs  BMI - 72.53 66.44  Systolic 034 742 595  Diastolic 77 81 84  Pulse 93 102 91   Orthostatic VS for the past 72 hrs (Last 3 readings):  Patient Position BP Location Cuff Size  07/03/21 1109 Sitting Left Arm Large  07/03/21 1058 Sitting Left Arm Large    Physical Exam Vitals reviewed.  Constitutional:      Comments: He is moderately built and mildly obese in no acute distress.  Neck:     Thyroid: No thyromegaly.  Cardiovascular:     Rate and Rhythm: Normal rate and regular rhythm.     Pulses:          Carotid pulses are 2+ on the right side and 2+ on the left side.      Femoral pulses are 2+ on the right side and 2+ on the left side.      Popliteal pulses are 0 on the right side and 0 on the left side.       Dorsalis pedis pulses are 0 on the right side and 0 on the left side.       Posterior tibial pulses are 0 on the right side and 0 on the left side.     Heart sounds: Normal heart sounds, S1 normal and S2 normal. No murmur heard.   No gallop.     Comments: No JVD Pulmonary:     Effort: No respiratory distress.     Breath sounds: No wheezing, rhonchi or rales.  Chest:     Comments: Sternotomy scar has healed well Musculoskeletal:     Right lower leg: Edema (minimal) present.     Left lower leg: Edema (minimal) present.  Neurological:     Mental Status: He is alert.   Laboratory examination:   Recent Labs    12/14/20 0124 12/15/20 0045 12/16/20 0022 05/22/21 1551  NA 135 138 139 146*  K 3.9 3.6 3.9 3.9  CL 103 106 105 105  CO2 '23 25 23 24  '$ GLUCOSE 98 204* 127* 177*  BUN 43* 40* 38* 35*  CREATININE 2.69* 2.76* 2.52* 2.73*  CALCIUM 8.4* 8.6* 8.9 9.1  GFRNONAA 24* 24* 26*  --    CrCl cannot be calculated (Patient's most recent lab result is older than the maximum 21 days allowed.).  CMP Latest Ref Rng & Units  05/22/2021 12/16/2020 12/15/2020  Glucose 70 - 99 mg/dL 177(H) 127(H) 204(H)  BUN 8 - 27 mg/dL 35(H) 38(H) 40(H)  Creatinine 0.76 - 1.27 mg/dL 2.73(H) 2.52(H) 2.76(H)  Sodium 134 - 144 mmol/L 146(H) 139 138  Potassium 3.5 - 5.2 mmol/L 3.9 3.9 3.6  Chloride 96 - 106 mmol/L 105 105 106  CO2 20 - 29 mmol/L '24 23 25  '$ Calcium 8.6 - 10.2 mg/dL 9.1 8.9 8.6(L)  Total Protein 6.0 - 8.5 g/dL 6.9 - -  Total Bilirubin 0.0 - 1.2 mg/dL 0.2 - -  Alkaline Phos 44 - 121 IU/L 96 - -  AST 0 - 40 IU/L 20 - -  ALT 0 - 44 IU/L 25 - -   CBC Latest Ref Rng & Units 05/08/2021 12/12/2020 12/11/2020  WBC 3.4 - 10.8 x10E3/uL 8.0 8.4 9.2  Hemoglobin 13.0 - 17.7 g/dL 11.9(L) 11.7(L) 10.8(L)  Hematocrit 37.5 - 51.0 % 35.7(L) 38.1(L) 35.9(L)  Platelets 150 - 450 x10E3/uL 205 218 203   Lipid Panel No results for input(s): CHOL, TRIG, LDLCALC, VLDL, HDL, CHOLHDL, LDLDIRECT in the last 8760 hours.  HEMOGLOBIN A1C Lab Results  Component Value Date   HGBA1C 5.4 03/25/2020   MPG 94 12/20/2019   TSH No results for input(s): TSH in the last 8760 hours.  BNP    Component Value Date/Time   BNP 113.0 (H) 05/08/2021 1201   BNP 184.8 (H) 12/11/2020 1533   ProBNP    Component Value Date/Time   PROBNP 64.0  05/28/2020 1538   External labs 08/11/2019: HDL 42, LDL 40, total cholesterol 107, triglycerides 71 A1c 5.4%  Allergies  No Known Allergies   Medications Prior to Visit:   Outpatient Medications Prior to Visit  Medication Sig Dispense Refill   albuterol (VENTOLIN HFA) 108 (90 Base) MCG/ACT inhaler TAKE 2 PUFFS BY MOUTH EVERY 6 HOURS AS NEEDED FOR WHEEZE OR SHORTNESS OF BREATH 8.5 each 3   aspirin EC 81 MG tablet Take 81 mg by mouth daily.     atorvastatin (LIPITOR) 20 MG tablet Take 1 tablet (20 mg total) by mouth every evening. 90 tablet 3   azithromycin (ZITHROMAX) 250 MG tablet Take as directed 6 tablet 0   budesonide-formoterol (SYMBICORT) 160-4.5 MCG/ACT inhaler Inhale 2 puffs into the lungs 2 (two)  times daily. 1 each 6   clonazePAM (KLONOPIN) 0.5 MG disintegrating tablet TAKE 1 TABLET BY MOUTH TWICE A DAY 60 tablet 5   clopidogrel (PLAVIX) 75 MG tablet TAKE 1 TABLET BY MOUTH EVERY DAY 90 tablet 1   cromolyn (OPTICROM) 4 % ophthalmic solution Place 1 drop into both eyes every Wednesday. At bedtime     [employer] ACETAMINOPHEN 325 MG tablet TAKE 2 CAPS BY MOUTH EVERY 6 HOURS AS NEEDED FOR PAIN FOR TEMP 100F OR ABOVE (Patient taking differently: Take 325 mg by mouth every 6 (six) hours as needed for mild pain or headache (temp over 100).) 60 tablet 0   [employer] STOOL SOFTENER/LAXATIVE 8.6-50 MG tablet TAKE 2 TABLETS BY MOUTH AT BEDTIME FOR CONSTIPATION 60 tablet 1   ferrous sulfate 325 (65 FE) MG tablet Take 325 mg by mouth daily with breakfast.     fluticasone (FLONASE) 50 MCG/ACT nasal spray Place 1 spray into both nostrils daily. 16 g 2   furosemide (LASIX) 80 MG tablet Take 120 mg by mouth 2 (two) times daily.     hydrALAZINE (APRESOLINE) 100 MG tablet Take 1 tablet (100 mg total) by mouth 3 (three) times daily. 270 tablet 3   isosorbide dinitrate (ISORDIL) 30 MG tablet TAKE 1 TABLET BY MOUTH THREE TIMES A DAY 270 tablet 1   metoprolol succinate (TOPROL-XL) 50 MG 24 hr tablet TAKE 3 TABLETS BY MOUTH EVERY DAY 90 tablet 1   Multiple Vitamin (MULTIVITAMIN) tablet Take 1 tablet by mouth daily.     nitroGLYCERIN (NITROSTAT) 0.4 MG SL tablet Place 0.4 mg under the tongue every 5 (five) minutes as needed for chest pain.     oxyCODONE-acetaminophen (PERCOCET) 5-325 MG tablet Take 1 tablet by mouth every 6 (six) hours as needed for severe pain. 20 tablet 0   potassium chloride (KLOR-CON) 10 MEQ tablet Take 10 mEq by mouth daily.     PREVIDENT 5000 BOOSTER PLUS 1.1 % PSTE Take 1 application by mouth every evening.     sertraline (ZOLOFT) 25 MG tablet TAKE 1 TABLET ONE TIME A DAY 90 tablet 1   silodosin (RAPAFLO) 8 MG CAPS capsule TAKE 1 CAPSULE BY MOUTH EVERYDAY AT BEDTIME (Patient taking differently: Take 8 mg  by mouth at bedtime.) 90 capsule 3   Vitamin D, Ergocalciferol, (DRISDOL) 1.25 MG (50000 UNIT) CAPS capsule TAKE 1 CAPSULE (50,000 UNITS TOTAL) BY MOUTH EVERY 7 (SEVEN) DAYS 12 capsule 3   No facility-administered medications prior to visit.   Final Medications at End of Visit    Current Meds  Medication Sig   albuterol (VENTOLIN HFA) 108 (90 Base) MCG/ACT inhaler TAKE 2 PUFFS BY MOUTH EVERY 6 HOURS AS NEEDED FOR WHEEZE OR  SHORTNESS OF BREATH   aspirin EC 81 MG tablet Take 81 mg by mouth daily.   atorvastatin (LIPITOR) 20 MG tablet Take 1 tablet (20 mg total) by mouth every evening.   azithromycin (ZITHROMAX) 250 MG tablet Take as directed   budesonide-formoterol (SYMBICORT) 160-4.5 MCG/ACT inhaler Inhale 2 puffs into the lungs 2 (two) times daily.   clonazePAM (KLONOPIN) 0.5 MG disintegrating tablet TAKE 1 TABLET BY MOUTH TWICE A DAY   clopidogrel (PLAVIX) 75 MG tablet TAKE 1 TABLET BY MOUTH EVERY DAY   cromolyn (OPTICROM) 4 % ophthalmic solution Place 1 drop into both eyes every Wednesday. At bedtime   [employer]S ACETAMINOPHEN 325 MG tablet TAKE 2 CAPS BY MOUTH EVERY 6 HOURS AS NEEDED FOR PAIN FOR TEMP 100F OR ABOVE (Patient taking differently: Take 325 mg by mouth every 6 (six) hours as needed for mild pain or headache (temp over 100).)   CVS STOOL SOFTENER/LAXATIVE 8.6-50 MG tablet TAKE 2 TABLETS BY MOUTH AT BEDTIME FOR CONSTIPATION   ferrous sulfate 325 (65 FE) MG tablet Take 325 mg by mouth daily with breakfast.   fluticasone (FLONASE) 50 MCG/ACT nasal spray Place 1 spray into both nostrils daily.   furosemide (LASIX) 80 MG tablet Take 120 mg by mouth 2 (two) times daily.   hydrALAZINE (APRESOLINE) 100 MG tablet Take 1 tablet (100 mg total) by mouth 3 (three) times daily.   isosorbide dinitrate (ISORDIL) 30 MG tablet TAKE 1 TABLET BY MOUTH THREE TIMES A DAY   metoprolol succinate (TOPROL-XL) 50 MG 24 hr tablet TAKE 3 TABLETS BY MOUTH EVERY DAY   Multiple Vitamin (MULTIVITAMIN) tablet Take 1  tablet by mouth daily.   nitroGLYCERIN (NITROSTAT) 0.4 MG SL tablet Place 0.4 mg under the tongue every 5 (five) minutes as needed for chest pain.   oxyCODONE-acetaminophen (PERCOCET) 5-325 MG tablet Take 1 tablet by mouth every 6 (six) hours as needed for severe pain.   potassium chloride (KLOR-CON) 10 MEQ tablet Take 10 mEq by mouth daily.   PREVIDENT 5000 BOOSTER PLUS 1.1 % PSTE Take 1 application by mouth every evening.   sertraline (ZOLOFT) 25 MG tablet TAKE 1 TABLET ONE TIME A DAY   silodosin (RAPAFLO) 8 MG CAPS capsule TAKE 1 CAPSULE BY MOUTH EVERYDAY AT BEDTIME (Patient taking differently: Take 8 mg by mouth at bedtime.)   Vitamin D, Ergocalciferol, (DRISDOL) 1.25 MG (50000 UNIT) CAPS capsule TAKE 1 CAPSULE (50,000 UNITS TOTAL) BY MOUTH EVERY 7 (SEVEN) DAYS    Radiology:  No results found.  Cardiac Studies:   Heart catheterization 08/18/2019: LM: Normal LAD: Ostial 95% stenosis Ramus: Ostial/prox 30% stenosis LCx: Ostial 75% stenosis RCA: Prox-mid diffuse 40% calcific disease. Distal RCA 70% ISR of 3.0 by 28 mm Promus placed 02/27/2014.  Pre-CABG Dopplers including ABI and carotid duplex: No significant carotid artery stenosis.  Antegrade vertebral artery flow. ABI normal, with triphasic waveforms at the level of the ankle.  Hx of CABG 08/15/2019: LIMA TO LAD, RIMA TO PDA, SEQUENTIAL RADIAL ARTERY TO Cx-OM1.  Bilateral lower extremity DVT study 12/12/2020: No evidence of DVT  PCV MYOCARDIAL PERFUSION WO LEXISCAN 01/20/2021 Lexiscan nuclear stress test performed using 1-day protocol. Normal myocardial perfusion. Stress LVEF calculated 49%, although visually appears normal. Low risk study.  Echocardiogram 05/12/2021:  Poor echo window. Wall motion with reduced sensitivity. Left ventricle  cavity is normal in size. Mild concentric remodeling of the left  ventricle. Normal global wall motion. Abnormal septal wall motion due to  post-operative coronary artery bypass graft.  Doppler evidence of grade I  (impaired) diastolic dysfunction, normal LAP. Normal LV systolic function  with visual EF 50-55%. Calculated EF 56%.  No significant change from 07/22/2020.  EKG:   07/03/2021: Sinus rhythm with 2 PVCs at a rate of 93 bpm.  Normal axis.  Incomplete right bundle branch block.  Poor R wave progression, cannot exclude anteroseptal infarct old.  No evidence of ischemia or underlying injury pattern.  05/08/2021: Sinus rhythm with PVCs at a rate of 97 bpm.  Normal axis.  Incomplete right bundle branch block.  Poor R wave progression, cannot exclude anteroseptal infarct old.  Compared EKG 12/18/2020, no significant change.  12/18/2020: sinus rhythm with PVCs at a rate of 96 bpm.  Normal axis.  Incomplete right bundle branch block.  Poor R wave progression, cannot exclude anteroseptal infarct old.  Compared to EKG 07/11/2020, no significant change.  12/11/2020: Sinus rhythm at a rate of 88 bpm.  Left axis, incomplete right bundle branch block.  Poor R wave progression, cannot exclude anteroseptal infarct old.  Unchanged compared to EKG 07/11/2020.  Assessment     ICD-10-CM   1. Chronic combined systolic and diastolic heart failure (HCC)  I50.42 Pro b natriuretic peptide (BNP)[id number] [facility])    EKG 12-Lead    2. Coronary artery disease of native artery of native heart with stable angina pectoris ([place])  I25.118       No orders of the defined types were placed in this encounter.   There are no discontinued medications.    Orders Placed This Encounter  Procedures   Pro b natriuretic peptide (BNP)[facility] [facility])   EKG 12-Lead    Recommendations:   Christian [name]  is a 76 y.o. AA male with hypertension, hyperlipidemia, DM with stage 3 CKD,  CAD with  stenting to the PDA on 02/27/2014. Patient presented with unstable angina and NSTEMI on 08/09/2019 along with a very small subsegmental pulmonary embolism and discharged on 10/18/2019 after  he underwent CABG x4 on 08/15/2019.    Echocardiogram 11/2020 revealed mildly reduced LVEF at 45-50%, therefore patient underwent stress test which was overall low risk.    She presents for 6-monthfollow-up.  Last office visit patient's blood pressure was soft and heart rate elevated, likely secondary to ongoing diarrhea at that time.  Patient also underwent repeat echocardiogram which revealed LVEF 50-55% with grade 1 diastolic dysfunction.  Patient reports symptoms of diarrhea, fatigue, and dyspnea have all resolved or significantly improved since last office visit.  He reports on home monitoring heart rate averages 80-85 bpm.  He continues to admit to high sodium intake.  Reiterated the importance of strict diet and medication compliance.  Reviewed and discussed results of echocardiogram, details above.  Patient is relatively stable cardiovascular standpoint.  Will not make changes to medications at this time.  Continue aspirin, atorvastatin, Plavix, hydralazine, isosorbide dinitrate, metoprolol, as well as Lasix.  Follow-up in 6 months, sooner if needed.   CAlethia Berthold PA-C 07/03/2021, 1:38 PM Office: [phone]

## 2021-07-03 ENCOUNTER — Ambulatory Visit: Payer: Federal, State, Local not specified - PPO | Admitting: Student

## 2021-07-03 ENCOUNTER — Encounter: Payer: Self-pay | Admitting: Student

## 2021-07-03 ENCOUNTER — Other Ambulatory Visit: Payer: Self-pay

## 2021-07-03 VITALS — BP 132/77 | HR 93 | Temp 97.9°F | Resp 17 | Ht 69.0 in | Wt 218.4 lb

## 2021-07-03 DIAGNOSIS — I25118 Atherosclerotic heart disease of native coronary artery with other forms of angina pectoris: Secondary | ICD-10-CM

## 2021-07-03 DIAGNOSIS — I5042 Chronic combined systolic (congestive) and diastolic (congestive) heart failure: Secondary | ICD-10-CM

## 2021-07-04 ENCOUNTER — Telehealth: Payer: Self-pay

## 2021-07-04 LAB — PRO B NATRIURETIC PEPTIDE: NT-Pro BNP: 987 pg/mL — ABNORMAL HIGH (ref 0–376)

## 2021-07-04 NOTE — Progress Notes (Signed)
? ? ?Chronic Care Management ?Pharmacy Assistant  ? ?Name: Christian [name]  MRN: [id number] DOB: 03-07-1946 ? ?Christian [name] is an 76 y.o. year old male who presents for his follow-up CCM visit with the clinical pharmacist. ? ?Reason for Encounter: Disease State ?  ?Conditions to be addressed/monitored: ?HTN ? ? ?Recent office visits:  ?None ID ? ?Recent consult visits:  ?07/03/21 Lawerance Cruel C, PA-C-Cardiology (CAD, HLD, HF) Pro b natriuretic peptide, no med changes ? ?06/25/21 Freddi Starr, MD-Pulmonary Disease (Bronchitis) Medication Changes: Start azithromycin antibiotic for possible bronchitis, Start flonase nasal spray ? ?Hospital visits:  ?None in previous 6 months ? ?Medications: ?Outpatient Encounter Medications as of 07/04/2021  ?Medication Sig  ? albuterol (VENTOLIN HFA) 108 (90 Base) MCG/ACT inhaler TAKE 2 PUFFS BY MOUTH EVERY 6 HOURS AS NEEDED FOR WHEEZE OR SHORTNESS OF BREATH  ? aspirin EC 81 MG tablet Take 81 mg by mouth daily.  ? atorvastatin (LIPITOR) 20 MG tablet Take 1 tablet (20 mg total) by mouth every evening.  ? azithromycin (ZITHROMAX) 250 MG tablet Take as directed  ? budesonide-formoterol (SYMBICORT) 160-4.5 MCG/ACT inhaler Inhale 2 puffs into the lungs 2 (two) times daily.  ? clonazePAM (KLONOPIN) 0.5 MG disintegrating tablet TAKE 1 TABLET BY MOUTH TWICE A DAY  ? clopidogrel (PLAVIX) 75 MG tablet TAKE 1 TABLET BY MOUTH EVERY DAY  ? cromolyn (OPTICROM) 4 % ophthalmic solution Place 1 drop into both eyes every Wednesday. At bedtime  ? [facility] ACETAMINOPHEN 325 MG tablet TAKE 2 CAPS BY MOUTH EVERY 6 HOURS AS NEEDED FOR PAIN FOR TEMP 100F OR ABOVE (Patient taking differently: Take 325 mg by mouth every 6 (six) hours as needed for mild pain or headache (temp over 100).)  ? [facility] STOOL SOFTENER/LAXATIVE 8.6-50 MG tablet TAKE 2 TABLETS BY MOUTH AT BEDTIME FOR CONSTIPATION  ? ferrous sulfate 325 (65 FE) MG tablet Take 325 mg by mouth daily with breakfast.  ? fluticasone (FLONASE) 50 MCG/ACT  nasal spray Place 1 spray into both nostrils daily.  ? furosemide (LASIX) 80 MG tablet Take 120 mg by mouth 2 (two) times daily.  ? hydrALAZINE (APRESOLINE) 100 MG tablet Take 1 tablet (100 mg total) by mouth 3 (three) times daily.  ? isosorbide dinitrate (ISORDIL) 30 MG tablet TAKE 1 TABLET BY MOUTH THREE TIMES A DAY  ? metoprolol succinate (TOPROL-XL) 50 MG 24 hr tablet TAKE 3 TABLETS BY MOUTH EVERY DAY  ? Multiple Vitamin (MULTIVITAMIN) tablet Take 1 tablet by mouth daily.  ? nitroGLYCERIN (NITROSTAT) 0.4 MG SL tablet Place 0.4 mg under the tongue every 5 (five) minutes as needed for chest pain.  ? oxyCODONE-acetaminophen (PERCOCET) 5-325 MG tablet Take 1 tablet by mouth every 6 (six) hours as needed for severe pain.  ? potassium chloride (KLOR-CON) 10 MEQ tablet Take 10 mEq by mouth daily.  ? PREVIDENT 5000 BOOSTER PLUS 1.1 % PSTE Take 1 application by mouth every evening.  ? sertraline (ZOLOFT) 25 MG tablet TAKE 1 TABLET ONE TIME A DAY  ? silodosin (RAPAFLO) 8 MG CAPS capsule TAKE 1 CAPSULE BY MOUTH EVERYDAY AT BEDTIME (Patient taking differently: Take 8 mg by mouth at bedtime.)  ? Vitamin D, Ergocalciferol, (DRISDOL) 1.25 MG (50000 UNIT) CAPS capsule TAKE 1 CAPSULE (50,000 UNITS TOTAL) BY MOUTH EVERY 7 (SEVEN) DAYS  ? ?No facility-administered encounter medications on file as of 07/04/2021.  ? ?Reviewed chart prior to disease state call. Spoke with patient regarding BP ? ?Recent Office Vitals: ?BP Readings from Last  3 Encounters:  ?07/03/21 132/77  ?06/25/21 128/84  ?05/22/21 128/72  ? ?Pulse Readings from Last 3 Encounters:  ?07/03/21 93  ?06/25/21 91  ?05/22/21 99  ?  ?Wt Readings from Last 3 Encounters:  ?07/03/21 218 lb 6.4 oz (99.1 kg)  ?06/25/21 213 lb (96.6 kg)  ?05/22/21 219 lb (99.3 kg)  ?  ? ?Kidney Function ?Lab Results  ?Component Value Date/Time  ? CREATININE 2.73 (H) 05/22/2021 03:51 PM  ? CREATININE 2.52 (H) 12/16/2020 12:22 AM  ? CREATININE 2.87 (H) 12/20/2019 01:12 PM  ? GFR 17.66 (L)  05/28/2020 03:38 PM  ? GFRNONAA 26 (L) 12/16/2020 12:22 AM  ? GFRAA 19 (L) 10/18/2019 12:46 AM  ? ? ?BMP Latest Ref Rng & Units 05/22/2021 12/16/2020 12/15/2020  ?Glucose 70 - 99 mg/dL 177(H) 127(H) 204(H)  ?BUN 8 - 27 mg/dL 35(H) 38(H) 40(H)  ?Creatinine 0.76 - 1.27 mg/dL 2.73(H) 2.52(H) 2.76(H)  ?BUN/Creat Ratio 10 - 24 13 - -  ?Sodium 134 - 144 mmol/L 146(H) 139 138  ?Potassium 3.5 - 5.2 mmol/L 3.9 3.9 3.6  ?Chloride 96 - 106 mmol/L 105 105 106  ?CO2 20 - 29 mmol/L '24 23 25  '$ ?Calcium 8.6 - 10.2 mg/dL 9.1 8.9 8.6(L)  ? ? ?Current antihypertensive regimen:  ?Hydralazine 100 mg ?Isosorbide 30 mg ?Metoprolol succ 50 mg ? ?How often are you checking your Blood Pressure? 1-2x per week ? ?Current home BP readings: Patient states his last reading was about 4 days ago which was 131/81 ? ?What recent interventions/DTPs have been made by any provider to improve Blood Pressure control since last CPP Visit: none ? ?Any recent hospitalizations or ED visits since last visit with CPP? No ? ?What diet changes have been made to improve Blood Pressure Control?  ?Patient states that he has cut back on salt and sugar ? ?What exercise is being done to improve your Blood Pressure Control?  ?Patient states that he does not exercise but would like to know if he is able to a walker to help him walk longer distances ? ?Adherence Review: ?Is the patient currently on ACE/ARB medication? No ?Does the patient have >5 day gap between last estimated fill dates? No ? ? ?  ?Care Gaps: ?Colonoscopy-09/26/08 ?Diabetic Foot Exam-05/28/21 ?Ophthalmology-05/30/20 ?Dexa Scan - NA ?Annual Well Visit - NA ?Micro albumin-NA ?Hemoglobin A1c- 03/25/20 ? ?Star Rating Drugs: ?Atorvastatin 20 mg-last fill 04/05/21 90 ds ( needs a refill) ?  ?Ethelene Hal ?Clinical Pharmacist Assistant ?[phone]  ? ?

## 2021-07-09 ENCOUNTER — Other Ambulatory Visit: Payer: Self-pay | Admitting: Cardiology

## 2021-07-09 ENCOUNTER — Other Ambulatory Visit: Payer: Self-pay | Admitting: Internal Medicine

## 2021-07-15 ENCOUNTER — Other Ambulatory Visit: Payer: Self-pay | Admitting: Student

## 2021-07-22 ENCOUNTER — Telehealth: Payer: Self-pay | Admitting: Internal Medicine

## 2021-07-22 DIAGNOSIS — I25110 Atherosclerotic heart disease of native coronary artery with unstable angina pectoris: Secondary | ICD-10-CM

## 2021-07-22 DIAGNOSIS — N184 Chronic kidney disease, stage 4 (severe): Secondary | ICD-10-CM

## 2021-07-22 DIAGNOSIS — E1142 Type 2 diabetes mellitus with diabetic polyneuropathy: Secondary | ICD-10-CM

## 2021-07-22 NOTE — Telephone Encounter (Signed)
I don't think I know anything about this request. Is he wanting electric wheelchair?  ?

## 2021-07-22 NOTE — Telephone Encounter (Signed)
While on the phone with patient to schedule his AWV, patient asked if I could send a message. ? ?Patient wants an update on the hoveround chair, he states he hasn't received any calls or heard anything in regards to the status of the order.  ?Advised patient I would send a message and someone clinical would follow up with him.  ?

## 2021-07-22 NOTE — Telephone Encounter (Signed)
I called patient back and he states that he meant a walker with wheels not an electric hoveround chair. ?

## 2021-07-22 NOTE — Telephone Encounter (Signed)
Order placed, can you forward to [employer] ?

## 2021-07-22 NOTE — Addendum Note (Signed)
Addended by: Pricilla Holm A on: 07/22/2021 04:14 PM ? ? Modules accepted: Orders ? ?

## 2021-07-23 ENCOUNTER — Telehealth: Payer: Self-pay

## 2021-07-23 ENCOUNTER — Ambulatory Visit (INDEPENDENT_AMBULATORY_CARE_PROVIDER_SITE_OTHER): Payer: Medicare Other

## 2021-07-23 DIAGNOSIS — Z0000 Encounter for general adult medical examination without abnormal findings: Secondary | ICD-10-CM

## 2021-07-23 NOTE — Progress Notes (Signed)
?I connected with Christian [name] today by telephone and verified that I am speaking with the correct person using two identifiers. ?Location patient: home ?Location provider: work ?Persons participating in the virtual visit: patient, provider. ?  ?I discussed the limitations, risks, security and privacy concerns of performing an evaluation and management service by telephone and the availability of in person appointments. I also discussed with the patient that there may be a patient responsible charge related to this service. The patient expressed understanding and verbally consented to this telephonic visit.  ?  ?Interactive audio and video telecommunications were attempted between this provider and patient, however failed, due to patient having technical difficulties OR patient did not have access to video capability.  We continued and completed visit with audio only. ? ?Some vital signs may be absent or patient reported.  ? ?Time Spent with patient on telephone encounter: 30 minutes ? ?Subjective:  ? Christian [name] is a 76 y.o. male who presents for Medicare Annual/Subsequent preventive examination. ? ?Review of Systems    ? ?Cardiac Risk Factors include: advanced age (>81mn, >>58women);diabetes mellitus;dyslipidemia;hypertension;male gender;family history of premature cardiovascular disease ? ?   ?Objective:  ?  ?There were no vitals filed for this visit. ?There is no height or weight on file to calculate BMI. ? ? ?  07/23/2021  ?  2:07 PM 10/31/2020  ?  7:41 AM 10/03/2020  ?  1:59 PM 09/18/2020  ?  9:42 AM 08/09/2019  ?  1:38 PM 10/18/2017  ?  2:25 PM 09/27/2014  ?  1:19 PM  ?Advanced Directives  ?Does Patient Have a Medical Advance Directive? Yes Yes Yes Yes No Yes Yes  ?Type of Advance Directive Living will;Healthcare Power of [place]Living will [place]   [place] will Living will  ?Does patient want to make changes to medical  advance directive? No - Patient declined  No - Patient declined No - Patient declined   No - Patient declined  ?Copy of [place]? Yes - validated most recent copy scanned in chart (See row information)  Yes - validated most recent copy scanned in chart (See row information)   No - copy requested No - copy requested  ?Would patient like information on creating a medical advance directive?     No - Patient declined    ? ? ?Current Medications (verified) ?Outpatient Encounter Medications as of 07/23/2021  ?Medication Sig  ? albuterol (VENTOLIN HFA) 108 (90 Base) MCG/ACT inhaler TAKE 2 PUFFS BY MOUTH EVERY 6 HOURS AS NEEDED FOR WHEEZE OR SHORTNESS OF BREATH  ? aspirin EC 81 MG tablet Take 81 mg by mouth daily.  ? atorvastatin (LIPITOR) 20 MG tablet TAKE 1 TABLET BY MOUTH EVERY DAY IN THE EVENING  ? budesonide-formoterol (SYMBICORT) 160-4.5 MCG/ACT inhaler Inhale 2 puffs into the lungs 2 (two) times daily.  ? clonazePAM (KLONOPIN) 0.5 MG disintegrating tablet TAKE 1 TABLET BY MOUTH TWICE A DAY  ? clopidogrel (PLAVIX) 75 MG tablet TAKE 1 TABLET BY MOUTH EVERY DAY  ? cromolyn (OPTICROM) 4 % ophthalmic solution Place 1 drop into both eyes every Wednesday. At bedtime  ? [facility] ACETAMINOPHEN 325 MG tablet TAKE 2 CAPS BY MOUTH EVERY 6 HOURS AS NEEDED FOR PAIN FOR TEMP 100F OR ABOVE (Patient taking differently: Take 325 mg by mouth every 6 (six) hours as needed for mild pain or headache (temp over 100).)  ? [facility]S STOOL SOFTENER/LAXATIVE 8.6-50  MG tablet TAKE 2 TABLETS BY MOUTH AT BEDTIME FOR CONSTIPATION  ? ferrous sulfate 325 (65 FE) MG tablet Take 325 mg by mouth daily with breakfast.  ? fluticasone (FLONASE) 50 MCG/ACT nasal spray Place 1 spray into both nostrils daily.  ? furosemide (LASIX) 80 MG tablet Take 120 mg by mouth 2 (two) times daily.  ? hydrALAZINE (APRESOLINE) 100 MG tablet Take 1 tablet (100 mg total) by mouth 3 (three) times daily.  ? isosorbide dinitrate (ISORDIL) 30 MG tablet TAKE 1  TABLET BY MOUTH THREE TIMES A DAY  ? metoprolol succinate (TOPROL-XL) 50 MG 24 hr tablet TAKE 3 TABLETS BY MOUTH EVERY DAY  ? Multiple Vitamin (MULTIVITAMIN) tablet Take 1 tablet by mouth daily.  ? nitroGLYCERIN (NITROSTAT) 0.4 MG SL tablet Place 0.4 mg under the tongue every 5 (five) minutes as needed for chest pain.  ? oxyCODONE-acetaminophen (PERCOCET) 5-325 MG tablet Take 1 tablet by mouth every 6 (six) hours as needed for severe pain.  ? potassium chloride (KLOR-CON) 10 MEQ tablet Take 10 mEq by mouth daily.  ? PREVIDENT 5000 BOOSTER PLUS 1.1 % PSTE Take 1 application by mouth every evening.  ? sertraline (ZOLOFT) 25 MG tablet TAKE 1 TABLET ONE TIME A DAY  ? silodosin (RAPAFLO) 8 MG CAPS capsule TAKE 1 CAPSULE BY MOUTH EVERYDAY AT BEDTIME (Patient taking differently: Take 8 mg by mouth at bedtime.)  ? Vitamin D, Ergocalciferol, (DRISDOL) 1.25 MG (50000 UNIT) CAPS capsule TAKE 1 CAPSULE (50,000 UNITS TOTAL) BY MOUTH EVERY 7 (SEVEN) DAYS  ? azithromycin (ZITHROMAX) 250 MG tablet Take as directed (Patient not taking: Reported on 07/23/2021)  ? ?No facility-administered encounter medications on file as of 07/23/2021.  ? ? ?Allergies (verified) ?Patient has no known allergies.  ? ?History: ?Past Medical History:  ?Diagnosis Date  ? Anemia   ? low iron  ? BPH (benign prostatic hypertrophy)   ? CHF (congestive heart failure) ([place])   ? Chronic kidney disease   ? Coronary artery disease   ? Dry eyes left  ? Hiatal hernia   ? 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  LIMA TO LAD, RIMA TO PDA, RADIAL ARTERY TO CIRC AND SEQUENTIALLY TO OM1. 08/15/2019  ? Hyperlipidemia   ? Hypertension   ? Incomplete bladder emptying   ? Myocardial infarction ([place]) 08/09/2019  ? Nocturia   ? PE (pulmonary thromboembolism) ([place]) 08/09/2019  ? small RLL PE 08/09/19  ? Pneumonia   ? as a child  ? Pre-diabetes   ? 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 SOB (shortness of breath) on exertion   ? ?Past Surgical History:  ?Procedure Laterality Date  ? APPLICATION OF WOUND VAC N/A 08/24/2019  ? Procedure: APPLICATION OF WOUND VAC;  Surgeon: [name], MD;  Location: [facility] OR;  Service: Thoracic;  Laterality: N/A;  ? APPLICATION OF WOUND VAC  08/29/2019  ? Procedure: Wound Vac change;  Surgeon: [name], MD;  Location: [facility];  Service: Open Heart Surgery;;  ? APPLICATION OF WOUND VAC N/A 09/04/2019  ? Procedure: Application Of Wound Vac;  Surgeon: Grace Harvest, MD;  Location: [place];  Service: Open Heart Surgery;  Laterality: N/A;  ? APPLICATION OF WOUND VAC N/A 09/06/2019  ? Procedure: APPLICATION OF ACELL, APPLICATION OF WOUND VAC USING PREVENA INCISIONAL  DRESSING;  Surgeon: Marla Roe,  Loel Lofty, DO;  Location: [place];  Service: Plastics;  Laterality: N/A;  ? AV FISTULA PLACEMENT Left 09/18/2020  ? Procedure: ARTERIOVENOUS (AV) FISTULA CREATION LEFT VERSUS GRAFT;  Surgeon: Serafina Mitchell, MD;  Location: [place];  Service: Vascular;  Laterality: Left;  ? BASCILIC VEIN TRANSPOSITION Left 10/31/2020  ? Procedure: LEFT SECOND STAGE BASCILIC VEIN TRANSPOSITION;  Surgeon: Serafina Mitchell, MD;  Location: [facility] OR;  Service: Vascular;  Laterality: Left;  ? CARDIAC CATHETERIZATION    ? CORONARY ARTERY BYPASS GRAFT N/A 08/15/2019  ? Procedure: CORONARY ARTERY BYPASS GRAFTING (CABG), x 4 using bilateral IMAs and left radial artery .  LIMA TO LAD, RIMA TO PDA, RADIAL ARTERY TO CIRC AND SEQUENTIALLY TO OM1.;  Surgeon: [name], MD;  Location: [place];  Service: Open Heart Surgery;  Laterality: N/A;  ? CORONARY STENT PLACEMENT  02/27/2014  ? distal rt/pd coronary       dr Einar Gip  ? CYSTO/ BLADDER BIOPSY'S/ CAUTHERIZATION  01-14-2004  DR Gaynelle Arabian  ? EXPLORATION POST OPERATIVE OPEN HEART N/A 08/16/2019  ? Procedure: Chest Closure S?P CABG WITH APPLICATION OF PREVENA  INCISIONAL WOUND VAC;  Surgeon: [name], MD;   Location: [place];  Service: Open Heart Surgery;  Laterality: N/A;  ? EXPLORATION POST OPERATIVE OPEN HEART N/A 08/21/2019  ? Procedure: CHEST WASHOUT S/P OPEN CHEST;  Surgeon: [name], MD;  Location

## 2021-07-23 NOTE — Patient Instructions (Signed)
Christian [name] , ?Thank you for taking time to come for your Medicare Wellness Visit. I appreciate your ongoing commitment to your health goals. Please review the following plan we discussed and let me know if I can assist you in the future.  ? ?Screening recommendations/referrals: ?Colonoscopy: Never done ?Recommended yearly ophthalmology/optometry visit for glaucoma screening and checkup ?Recommended yearly dental visit for hygiene and checkup ? ?Vaccinations: ?Influenza vaccine: Due every Fall Season ?Pneumococcal vaccine: 02/28/2014, 01/09/2019 ?Tdap vaccine: 08/13/2015; due every 10 years ?Shingles vaccine: never done   ?Covid-19: 06/01/2019, 06/22/2019, 01/18/2020, 12/06/2020 ? ?Advanced directives: Yes; documents on file ? ?Conditions/risks identified: Yes ? ?Next appointment: Please schedule your next Medicare Wellness Visit with your Nurse Health Advisor in 1 year or 366 days by calling [phone]. ? ?Preventive Care 75 Years and Older, Male ?Preventive care refers to lifestyle choices and visits with your health care provider that can promote health and wellness. ?What does preventive care include? ?A yearly physical exam. This is also called an annual well check. ?Dental exams once or twice a year. ?Routine eye exams. Ask your health care provider how often you should have your eyes checked. ?Personal lifestyle choices, including: ?Daily care of your teeth and gums. ?Regular physical activity. ?Eating a healthy diet. ?Avoiding tobacco and drug use. ?Limiting alcohol use. ?Practicing safe sex. ?Taking low doses of aspirin every day. ?Taking vitamin and mineral supplements as recommended by your health care provider. ?What happens during an annual well check? ?The services and screenings done by your health care provider during your annual well check will depend on your age, overall health, lifestyle risk factors, and family history of disease. ?Counseling  ?Your health care provider may ask you questions about  your: ?Alcohol use. ?Tobacco use. ?Drug use. ?Emotional well-being. ?Home and relationship well-being. ?Sexual activity. ?Eating habits. ?History of falls. ?Memory and ability to understand (cognition). ?Work and work Statistician. ?Screening  ?You may have the following tests or measurements: ?Height, weight, and BMI. ?Blood pressure. ?Lipid and cholesterol levels. These may be checked every 5 years, or more frequently if you are over 2 years old. ?Skin check. ?Lung cancer screening. You may have this screening every year starting at age 75 if you have a 30-pack-year history of smoking and currently smoke or have quit within the past 15 years. ?Fecal occult blood test (FOBT) of the stool. You may have this test every year starting at age 75. ?Flexible sigmoidoscopy or colonoscopy. You may have a sigmoidoscopy every 5 years or a colonoscopy every 10 years starting at age 75. ?Prostate cancer screening. Recommendations will vary depending on your family history and other risks. ?Hepatitis C blood test. ?Hepatitis B blood test. ?Sexually transmitted disease (STD) testing. ?Diabetes screening. This is done by checking your blood sugar (glucose) after you have not eaten for a while (fasting). You may have this done every 1-3 years. ?Abdominal aortic aneurysm (AAA) screening. You may need this if you are a current or former smoker. ?Osteoporosis. You may be screened starting at age 75 if you are at high risk. ?Talk with your health care provider about your test results, treatment options, and if necessary, the need for more tests. ?Vaccines  ?Your health care provider may recommend certain vaccines, such as: ?Influenza vaccine. This is recommended every year. ?Tetanus, diphtheria, and acellular pertussis (Tdap, Td) vaccine. You may need a Td booster every 10 years. ?Zoster vaccine. You may need this after age 75. ?Pneumococcal 13-valent conjugate (PCV13) vaccine. One dose is recommended  after age 75. ?Pneumococcal  polysaccharide (PPSV23) vaccine. One dose is recommended after age 75. ?Talk to your health care provider about which screenings and vaccines you need and how often you need them. ?This information is not intended to replace advice given to you by your health care provider. Make sure you discuss any questions you have with your health care provider. ?Document Released: 05/10/2015 Document Revised: 01/01/2016 Document Reviewed: 02/12/2015 ?Elsevier Interactive Patient Education ? 2017 [place]. ? ?Fall Prevention in the Home ?Falls can cause injuries. They can happen to people of all ages. There are many things you can do to make your home safe and to help prevent falls. ?What can I do on the outside of my home? ?Regularly fix the edges of walkways and driveways and fix any cracks. ?Remove anything that might make you trip as you walk through a door, such as a raised step or threshold. ?Trim any bushes or trees on the path to your home. ?Use bright outdoor lighting. ?Clear any walking paths of anything that might make someone trip, such as rocks or tools. ?Regularly check to see if handrails are loose or broken. Make sure that both sides of any steps have handrails. ?Any raised decks and porches should have guardrails on the edges. ?Have any leaves, snow, or ice cleared regularly. ?Use sand or salt on walking paths during winter. ?Clean up any spills in your garage right away. This includes oil or grease spills. ?What can I do in the bathroom? ?Use night lights. ?Install grab bars by the toilet and in the tub and shower. Do not use towel bars as grab bars. ?Use non-skid mats or decals in the tub or shower. ?If you need to sit down in the shower, use a plastic, non-slip stool. ?Keep the floor dry. Clean up any water that spills on the floor as soon as it happens. ?Remove soap buildup in the tub or shower regularly. ?Attach bath mats securely with double-sided non-slip rug tape. ?Do not have throw rugs and other  things on the floor that can make you trip. ?What can I do in the bedroom? ?Use night lights. ?Make sure that you have a light by your bed that is easy to reach. ?Do not use any sheets or blankets that are too big for your bed. They should not hang down onto the floor. ?Have a firm chair that has side arms. You can use this for support while you get dressed. ?Do not have throw rugs and other things on the floor that can make you trip. ?What can I do in the kitchen? ?Clean up any spills right away. ?Avoid walking on wet floors. ?Keep items that you use a lot in easy-to-reach places. ?If you need to reach something above you, use a strong step stool that has a grab bar. ?Keep electrical cords out of the way. ?Do not use floor polish or wax that makes floors slippery. If you must use wax, use non-skid floor wax. ?Do not have throw rugs and other things on the floor that can make you trip. ?What can I do with my stairs? ?Do not leave any items on the stairs. ?Make sure that there are handrails on both sides of the stairs and use them. Fix handrails that are broken or loose. Make sure that handrails are as long as the stairways. ?Check any carpeting to make sure that it is firmly attached to the stairs. Fix any carpet that is loose or worn. ?Avoid having throw  rugs at the top or bottom of the stairs. If you do have throw rugs, attach them to the floor with carpet tape. ?Make sure that you have a light switch at the top of the stairs and the bottom of the stairs. If you do not have them, ask someone to add them for you. ?What else can I do to help prevent falls? ?Wear shoes that: ?Do not have high heels. ?Have rubber bottoms. ?Are comfortable and fit you well. ?Are closed at the toe. Do not wear sandals. ?If you use a stepladder: ?Make sure that it is fully opened. Do not climb a closed stepladder. ?Make sure that both sides of the stepladder are locked into place. ?Ask someone to hold it for you, if possible. ?Clearly  mark and make sure that you can see: ?Any grab bars or handrails. ?First and last steps. ?Where the edge of each step is. ?Use tools that help you move around (mobility aids) if they are needed. These include: ?Can

## 2021-07-23 NOTE — Telephone Encounter (Signed)
Patient is requesting the following rx for DME: [employer] Walker, [employer] biochemist.  Patient stated that he has issues with gait/balance and history of falls.  CB# [phone]. ?

## 2021-07-24 NOTE — Telephone Encounter (Signed)
Order has been faxed to [facility] ?

## 2021-07-29 ENCOUNTER — Telehealth: Payer: Medicare Other

## 2021-07-29 NOTE — Progress Notes (Signed)
BNP was elevated. Please follow up if breathing had improved since office visit.

## 2021-07-29 NOTE — Progress Notes (Signed)
Pt stated that his breathing is a bit better, but it depends on the day. Today it is on and off. Recently he was prescribed a new inhaler but it has not been working well. He still has some right sided chest pain.

## 2021-07-29 NOTE — Progress Notes (Deleted)
? ?Chronic Care Management ?Pharmacy Note ? ?07/29/2021 ?Name:  Christian [name] MRN:  [id number] DOB:  1945/12/16 ? ?Summary: ?-Patient confirms compliance to current medications ?-Blood pressure when checked at home has been in range ?-Has follow up with nephrology and cardiology at the end of the month ?-Establishing with pulmonology later this month  ? ?Recommendations/Changes made from today's visit: ?-Recommended for patient to make follow up PCP appointment  ?-Patient counseled on need for updated COVID vaccine and Shingles vaccine as well ? ? ?Subjective: ?Christian [name] is an 76 y.o. year old male who is a primary patient of Hoyt Koch, MD.  The CCM team was consulted for assistance with disease management and care coordination needs.   ? ?Engaged with patient by telephone for follow up visit in response to provider referral for pharmacy case management and/or care coordination services.  ? ?Consent to Services:  ?The patient was given information about Chronic Care Management services, agreed to services, and gave verbal consent prior to initiation of services.  Please see initial visit note for detailed documentation.  ? ?Patient Care Team: ?Hoyt Koch, MD as PCP - General (Internal Medicine) ?Gardiner Barefoot, DPM as Consulting Physician (Podiatry) ?McKenzie, Candee Furbish, MD as Consulting Physician (Urology) ?Despina Hick, MD as Referring Physician (Cardiology) ?Tomasa Blase, [facility] as Pharmacist (Pharmacist) ? ? ?Patient lives at home with wife who handles his medications. She sets up pill box for him and keeps track of changes on med list. Patient is currently getting help from home health nurse 3 days a week but this service will end soon per patient. He is interested in getting into [place] for supervised physical therapy and exercise. ? ? ?Recent office visits: ?05/16/2021 - Dr. Sharlet Salina - [place] of left neck ordered - ok to stop iron pills if desired  ? ?Recent  consult visits: ?07/03/2021 - Celeste Cantwell PA-C - Cardiology - stable, no changes to medications ?06/25/2021 - Dr. Erin Fulling - Pulmonology - use spacer with symbicort - azithromycin for possible bronchitis, and flonase for congestion  ?05/28/2021 - Dr. Elisha Ponder - podiatry - no changes to medications ?05/22/2021 - Dr. Erin Fulling - Pulmonology - start dulera - f/u in 4-6 weeks   ?05/08/2021 - Celeste Cantwell PA-C - Cardiology - ECHO ordered - hold hydralazine for the next few days due to dehydration - follow up in 3 months  ? ? ?Hospital visits: ?None since last visit  ? ?Objective: ? ?Lab Results  ?Component Value Date  ? CREATININE 2.73 (H) 05/22/2021  ? BUN 35 (H) 05/22/2021  ? GFR 17.66 (L) 05/28/2020  ? GFRNONAA 26 (L) 12/16/2020  ? GFRAA 19 (L) 10/18/2019  ? NA 146 (H) 05/22/2021  ? K 3.9 05/22/2021  ? CALCIUM 9.1 05/22/2021  ? CO2 24 05/22/2021  ? GLUCOSE 177 (H) 05/22/2021  ? ? ?Lab Results  ?Component Value Date/Time  ? HGBA1C 5.4 03/25/2020 11:55 AM  ? HGBA1C 4.9 12/20/2019 01:12 PM  ? GFR 17.66 (L) 05/28/2020 03:38 PM  ? GFR 18.69 (L) 03/25/2020 11:55 AM  ?  ?Last diabetic Eye exam:  ?Lab Results  ?Component Value Date/Time  ? HMDIABEYEEXA No Retinopathy 05/30/2020 10:25 AM  ?  ?Last diabetic Foot exam: No results found for: HMDIABFOOTEX  ? ?Lab Results  ?Component Value Date  ? CHOL 107 08/11/2019  ? HDL 42 08/11/2019  ? [place] 40 08/11/2019  ? LDLDIRECT 40.0 11/23/2018  ? TRIG 71 08/27/2019  ? CHOLHDL 2.5 08/11/2019  ? ? ? ?  Latest Ref Rng & Units 05/22/2021  ?  3:51 PM 10/04/2020  ?  2:23 AM 05/28/2020  ?  3:38 PM  ?Hepatic Function  ?Total Protein 6.0 - 8.5 g/dL 6.9   7.0   8.0    ?Albumin 3.7 - 4.7 g/dL 4.5   3.8   4.1    ?AST 0 - 40 IU/L 20   32   32    ?ALT 0 - 44 IU/L 25   52   28    ?Alk Phosphatase 44 - 121 IU/L 96   80   80    ?Total Bilirubin 0.0 - 1.2 mg/dL 0.2   0.5   0.3    ? ? ?Lab Results  ?Component Value Date/Time  ? TSH 1.24 05/28/2020 03:38 PM  ? TSH 2.590 04/19/2019 09:11 AM  ? ? ? ?  Latest Ref  Rng & Units 05/08/2021  ? 12:01 PM 12/12/2020  ?  3:36 AM 12/11/2020  ?  3:33 PM  ?CBC  ?WBC 3.4 - 10.8 x10E3/uL 8.0   8.4   9.2    ?Hemoglobin 13.0 - 17.7 g/dL 11.9   11.7   10.8    ?Hematocrit 37.5 - 51.0 % 35.7   38.1   35.9    ?Platelets 150 - 450 x10E3/uL 205   218   203    ? ? ?Lab Results  ?Component Value Date/Time  ? VD25OH 58.52 05/28/2020 03:38 PM  ? VD25OH 95.53 10/19/2017 11:10 AM  ? ? ?Clinical ASCVD: Yes  ?The ASCVD Risk score (Arnett DK, et al., 2019) failed to calculate for the following reasons: ?  The patient has a prior MI or stroke diagnosis   ? ? ?  07/23/2021  ?  2:10 PM 09/12/2020  ? 10:04 AM 09/12/2020  ?  9:54 AM  ?Depression screen PHQ 2/9  ?Decreased Interest 0 0 0  ?Down, Depressed, Hopeless 0 0 0  ?PHQ - 2 Score 0 0 0  ?  ? ?Social History  ? ?Tobacco Use  ?Smoking Status Former  ? Packs/day: 0.50  ? Years: 20.00  ? Pack years: 10.00  ? Types: Cigarettes  ? Quit date: 32  ? Years since quitting: 48.2  ?Smokeless Tobacco Never  ? ?BP Readings from Last 3 Encounters:  ?07/03/21 132/77  ?06/25/21 128/84  ?05/22/21 128/72  ? ?Pulse Readings from Last 3 Encounters:  ?07/03/21 93  ?06/25/21 91  ?05/22/21 99  ? ?Wt Readings from Last 3 Encounters:  ?07/03/21 218 lb 6.4 oz (99.1 kg)  ?06/25/21 213 lb (96.6 kg)  ?05/22/21 219 lb (99.3 kg)  ? ?BMI Readings from Last 3 Encounters:  ?07/03/21 32.25 kg/m?  ?06/25/21 31.45 kg/m?  ?05/22/21 34.30 kg/m?  ? ? ?Assessment/Interventions: Review of patient past medical history, allergies, medications, health status, including review of consultants reports, laboratory and other test data, was performed as part of comprehensive evaluation and provision of chronic care management services.  ? ?SDOH:  (Social Determinants of Health) assessments and interventions performed: Yes ? ?SDOH Screenings  ? ?Alcohol Screen: Low Risk   ? Last Alcohol Screening Score (AUDIT): 1  ?Depression (PHQ2-9): Low Risk   ? PHQ-2 Score: 0  ?Financial Resource Strain: Low Risk   ?  Difficulty of Paying Living Expenses: Not hard at all  ?Food Insecurity: No Food Insecurity  ? Worried About Charity fundraiser in the Last Year: Never true  ? Ran Out of Food in the Last Year: Never true  ?Housing:  Low Risk   ? Last Housing Risk Score: 0  ?Physical Activity: Inactive  ? Days of Exercise per Week: 0 days  ? Minutes of Exercise per Session: 0 min  ?Social Connections: Socially Integrated  ? Frequency of Communication with Friends and Family: More than three times a week  ? Frequency of Social Gatherings with Friends and Family: More than three times a week  ? Attends Religious Services: More than 4 times per year  ? Active Member of Clubs or Organizations: Yes  ? Attends Archivist Meetings: More than 4 times per year  ? Marital Status: Married  ?Stress: No Stress Concern Present  ? Feeling of Stress : Not at all  ?Tobacco Use: Medium Risk  ? Smoking Tobacco Use: Former  ? Smokeless Tobacco Use: Never  ? Passive Exposure: Not on file  ?Transportation Needs: No Transportation Needs  ? Lack of Transportation (Medical): No  ? Lack of Transportation (Non-Medical): No  ? ? ?CCM Care Plan ? ?No Known Allergies ? ?Medications Reviewed Today   ? ? Reviewed by Sheral Flow, LPN (Licensed Practical Nurse) on 07/23/21 at 22  Med List Status: <None>  ? ?Medication Order Taking? Sig Documenting Provider Last Dose Status Informant  ?albuterol (VENTOLIN HFA) 108 (90 Base) MCG/ACT inhaler [id number] Yes TAKE 2 PUFFS BY MOUTH EVERY 6 HOURS AS NEEDED FOR WHEEZE OR SHORTNESS OF Hoover Brunette, MD Taking Active   ?aspirin EC 81 MG tablet [id number] Yes Take 81 mg by mouth daily. [provider] Taking Active Multiple Informants  ?atorvastatin (LIPITOR) 20 MG tablet [id number] Yes TAKE 1 TABLET BY MOUTH EVERY DAY IN THE Noah Charon, MD Taking Active   ?azithromycin (ZITHROMAX) 250 MG tablet [id number] No Take as directed  ?Patient not taking: Reported on 07/23/2021  ? Freddi Starr, MD Not Taking Active   ?budesonide-formoterol [facility]) 160-4.5 MCG/ACT inhaler [id number] Yes Inhale 2 puffs into the lungs 2 (two) times daily. Freddi Starr, MD Taking Active   ?clonazePA

## 2021-08-06 NOTE — Progress Notes (Signed)
I left a voicemail for patient to discuss symptoms and follow up.

## 2021-08-08 ENCOUNTER — Telehealth: Payer: Self-pay

## 2021-08-08 NOTE — Progress Notes (Signed)
? ? ?Chronic Care Management ?Pharmacy Assistant  ? ?Name: Christian [name]  MRN: [id number] DOB: 03-Aug-1945 ? ? ?Reason for Encounter: Disease State ?  ?Conditions to be addressed/monitored: ?HTN ? ? ?Recent office visits:  ?None ID ? ?Recent consult visits:  ?None ID ? ?Hospital visits:  ?None since last coordination call ? ?Medications: ?Outpatient Encounter Medications as of 08/08/2021  ?Medication Sig  ? albuterol (VENTOLIN HFA) 108 (90 Base) MCG/ACT inhaler TAKE 2 PUFFS BY MOUTH EVERY 6 HOURS AS NEEDED FOR WHEEZE OR SHORTNESS OF BREATH  ? aspirin EC 81 MG tablet Take 81 mg by mouth daily.  ? atorvastatin (LIPITOR) 20 MG tablet TAKE 1 TABLET BY MOUTH EVERY DAY IN THE EVENING  ? azithromycin (ZITHROMAX) 250 MG tablet Take as directed (Patient not taking: Reported on 07/23/2021)  ? budesonide-formoterol (SYMBICORT) 160-4.5 MCG/ACT inhaler Inhale 2 puffs into the lungs 2 (two) times daily.  ? clonazePAM (KLONOPIN) 0.5 MG disintegrating tablet TAKE 1 TABLET BY MOUTH TWICE A DAY  ? clopidogrel (PLAVIX) 75 MG tablet TAKE 1 TABLET BY MOUTH EVERY DAY  ? cromolyn (OPTICROM) 4 % ophthalmic solution Place 1 drop into both eyes every Wednesday. At bedtime  ? [facility] ACETAMINOPHEN 325 MG tablet TAKE 2 CAPS BY MOUTH EVERY 6 HOURS AS NEEDED FOR PAIN FOR TEMP 100F OR ABOVE (Patient taking differently: Take 325 mg by mouth every 6 (six) hours as needed for mild pain or headache (temp over 100).)  ? [facility]S STOOL SOFTENER/LAXATIVE 8.6-50 MG tablet TAKE 2 TABLETS BY MOUTH AT BEDTIME FOR CONSTIPATION  ? ferrous sulfate 325 (65 FE) MG tablet Take 325 mg by mouth daily with breakfast.  ? fluticasone (FLONASE) 50 MCG/ACT nasal spray Place 1 spray into both nostrils daily.  ? furosemide (LASIX) 80 MG tablet Take 120 mg by mouth 2 (two) times daily.  ? hydrALAZINE (APRESOLINE) 100 MG tablet Take 1 tablet (100 mg total) by mouth 3 (three) times daily.  ? isosorbide dinitrate (ISORDIL) 30 MG tablet TAKE 1 TABLET BY MOUTH THREE TIMES A DAY  ?  metoprolol succinate (TOPROL-XL) 50 MG 24 hr tablet TAKE 3 TABLETS BY MOUTH EVERY DAY  ? Multiple Vitamin (MULTIVITAMIN) tablet Take 1 tablet by mouth daily.  ? nitroGLYCERIN (NITROSTAT) 0.4 MG SL tablet Place 0.4 mg under the tongue every 5 (five) minutes as needed for chest pain.  ? oxyCODONE-acetaminophen (PERCOCET) 5-325 MG tablet Take 1 tablet by mouth every 6 (six) hours as needed for severe pain.  ? potassium chloride (KLOR-CON) 10 MEQ tablet Take 10 mEq by mouth daily.  ? PREVIDENT 5000 BOOSTER PLUS 1.1 % PSTE Take 1 application by mouth every evening.  ? sertraline (ZOLOFT) 25 MG tablet TAKE 1 TABLET ONE TIME A DAY  ? silodosin (RAPAFLO) 8 MG CAPS capsule TAKE 1 CAPSULE BY MOUTH EVERYDAY AT BEDTIME (Patient taking differently: Take 8 mg by mouth at bedtime.)  ? Vitamin D, Ergocalciferol, (DRISDOL) 1.25 MG (50000 UNIT) CAPS capsule TAKE 1 CAPSULE (50,000 UNITS TOTAL) BY MOUTH EVERY 7 (SEVEN) DAYS  ? ?No facility-administered encounter medications on file as of 08/08/2021.  ? ?Reviewed chart prior to disease state call. Spoke with patient regarding BP ? ?Recent Office Vitals: ?BP Readings from Last 3 Encounters:  ?07/03/21 132/77  ?06/25/21 128/84  ?05/22/21 128/72  ? ?Pulse Readings from Last 3 Encounters:  ?07/03/21 93  ?06/25/21 91  ?05/22/21 99  ?  ?Wt Readings from Last 3 Encounters:  ?07/03/21 218 lb 6.4 oz (99.1 kg)  ?06/25/21 213  lb (96.6 kg)  ?05/22/21 219 lb (99.3 kg)  ?  ? ?Kidney Function ?Lab Results  ?Component Value Date/Time  ? CREATININE 2.73 (H) 05/22/2021 03:51 PM  ? CREATININE 2.52 (H) 12/16/2020 12:22 AM  ? CREATININE 2.87 (H) 12/20/2019 01:12 PM  ? GFR 17.66 (L) 05/28/2020 03:38 PM  ? GFRNONAA 26 (L) 12/16/2020 12:22 AM  ? GFRAA 19 (L) 10/18/2019 12:46 AM  ? ? ? ?  Latest Ref Rng & Units 05/22/2021  ?  3:51 PM 12/16/2020  ? 12:22 AM 12/15/2020  ? 12:45 AM  ?BMP  ?Glucose 70 - 99 mg/dL 177   127   204    ?BUN 8 - 27 mg/dL 35   38   40    ?Creatinine 0.76 - 1.27 mg/dL 2.73   2.52   2.76     ?BUN/Creat Ratio 10 - 24 13      ?Sodium 134 - 144 mmol/L 146   139   138    ?Potassium 3.5 - 5.2 mmol/L 3.9   3.9   3.6    ?Chloride 96 - 106 mmol/L 105   105   106    ?CO2 20 - 29 mmol/L '24   23   25    '$ ?Calcium 8.6 - 10.2 mg/dL 9.1   8.9   8.6    ? ? ?Current antihypertensive regimen:  ?Hydralazine 100 mg ?Isosorbide 30 mg ?Metoprolol succ 50 mg ? ?How often are you checking your Blood Pressure?Patient states he checks blood pressure 1-2x per week ? ?Current home BP readings:Patient states last reading was yesterday 156/76 ? ?What recent interventions/DTPs have been made by any provider to improve Blood Pressure control since last CPP Visit: None noted since last coordination call ? ?Any recent hospitalizations or ED visits since last visit with CPP? No, not since last coordination call ? ?What diet changes have been made to improve Blood Pressure Control?  ?Patient states that he has not made any changes to his diet ? ?What exercise is being done to improve your Blood Pressure Control?  ?Patient states that he does not exercise but he can't walk long distance. He states that if he had a walk about walker with a seat that he would be able to walk more regularly. ? ?Adherence Review: ?Is the patient currently on ACE/ARB medication? No ?Does the patient have >5 day gap between last estimated fill dates? No ? ?Care Gaps: ?Colonoscopy-09/26/08 ?Diabetic Foot Exam-05/28/21 ?Ophthalmology-05/30/20 ?Dexa Scan - NA ?Annual Well Visit - NA ?Micro albumin-NA ?Hemoglobin A1c- 03/25/20 ?  ?Star Rating Drugs: ?Atorvastatin 20 mg-last fill 07/09/21 90 ds  ?  ?Ethelene Hal ?Clinical Pharmacist Assistant ?[phone]  ?

## 2021-08-10 ENCOUNTER — Other Ambulatory Visit: Payer: Self-pay | Admitting: Student

## 2021-08-13 ENCOUNTER — Other Ambulatory Visit: Payer: Self-pay | Admitting: Pulmonary Disease

## 2021-08-13 DIAGNOSIS — R0982 Postnasal drip: Secondary | ICD-10-CM

## 2021-08-13 NOTE — Progress Notes (Signed)
Patient states that he does not keep a log of blood pressures but he wilt try to start writing his numbers down this week. Told the patient I will call him back for recordings next Monday. ? ?Christian [name] ?Clinical Pharmacist Assistant ?[phone]  ?

## 2021-08-14 ENCOUNTER — Other Ambulatory Visit: Payer: Self-pay | Admitting: Internal Medicine

## 2021-08-27 ENCOUNTER — Ambulatory Visit (INDEPENDENT_AMBULATORY_CARE_PROVIDER_SITE_OTHER): Payer: Medicare Other | Admitting: Podiatry

## 2021-08-27 ENCOUNTER — Encounter: Payer: Self-pay | Admitting: Podiatry

## 2021-08-27 DIAGNOSIS — M79674 Pain in right toe(s): Secondary | ICD-10-CM

## 2021-08-27 DIAGNOSIS — E1142 Type 2 diabetes mellitus with diabetic polyneuropathy: Secondary | ICD-10-CM

## 2021-08-27 DIAGNOSIS — M79675 Pain in left toe(s): Secondary | ICD-10-CM | POA: Diagnosis not present

## 2021-08-27 DIAGNOSIS — M2141 Flat foot [pes planus] (acquired), right foot: Secondary | ICD-10-CM | POA: Diagnosis not present

## 2021-08-27 DIAGNOSIS — M2142 Flat foot [pes planus] (acquired), left foot: Secondary | ICD-10-CM | POA: Diagnosis not present

## 2021-08-27 DIAGNOSIS — B351 Tinea unguium: Secondary | ICD-10-CM

## 2021-08-27 DIAGNOSIS — R600 Localized edema: Secondary | ICD-10-CM | POA: Diagnosis not present

## 2021-09-04 ENCOUNTER — Telehealth: Payer: Self-pay

## 2021-09-04 NOTE — Telephone Encounter (Signed)
CMN Submitted ?

## 2021-09-05 ENCOUNTER — Other Ambulatory Visit: Payer: Self-pay | Admitting: Student

## 2021-09-06 NOTE — Progress Notes (Signed)
?  Subjective:  ?Patient ID: Christian [name], male    DOB: 1945-11-05,  MRN: [id number] ? ?[name] presents to clinic today for at risk foot care with history of diabetic neuropathy, and multiple sclerosis with painful elongated mycotic toenails 1-5 bilaterally which are tender when wearing enclosed shoe gear. Pain is relieved with periodic professional debridement. ? ?New problem(s): None.  ? ?PCP is Hoyt Koch, MD , and last visit was June 25, 2021. ? ?No Known Allergies ? ?Review of Systems: Negative except as noted in the HPI. ? ?Objective: No changes noted in today's physical examination. ?[name] is a pleasant 76 y.o. male in NAD. AAO X 3. ? ?Vascular Examination: ?CFT <3 seconds b/l LE. Palpable DP pulse(s) b/l LE. Faintly palpable PT pulse(s) b/l LE. Pedal hair absent. No pain with calf compression b/l. Lower extremity skin temperature gradient within normal limits. Nonpitting edema noted BLE. No ischemia or gangrene noted b/l LE. No cyanosis or clubbing noted b/l LE. ? ?Dermatological Examination: ?Pedal integument with normal turgor, texture and tone BLE. No open wounds b/l LE. No interdigital macerations noted b/l LE. Toenails right hallux and 2-5 b/l elongated, discolored, dystrophic, thickened, crumbly with subungual debris and tenderness to dorsal palpation. No hyperkeratotic nor porokeratotic lesions present on today's visit. Anonychia noted L hallux. Nailbed(s) epithelialized.   ? ?Musculoskeletal Examination: ?Muscle strength 5/5 to all lower extremity muscle groups bilaterally. Pes planus deformity noted bilateral LE. Utilizes cane for ambulation assistance. ? ?Neurological Examination: ?Protective sensation intact with 10 gram monofilament right lower extremity. Protective sensation decreased with 10 gram monofilament left lower extremity. Vibratory sensation intact right lower extremity. Vibratory sensation diminished left lower extremity. ? ?Assessment/Plan: ?1. Pain  due to onychomycosis of toenails of both feet   ?2. Pes planus of both feet   ?3. Bilateral lower extremity edema   ?4. Diabetic polyneuropathy associated with type 2 diabetes mellitus ([place])   ? -Patient was evaluated and treated. All patient's and/or POA's questions/concerns answered on today's visit. ?-Patient to continue soft, supportive shoe gear daily. Order entered for one pair extra depth shoes and 3 pair heat moldable insoles. Patient qualifies based on diagnoses. ?-Patient to schedule appointment with Pedorthist for diabetic shoe measurements. ?-Toenails 2-5 bilaterally and R hallux debrided in length and girth without iatrogenic bleeding with sterile nail nipper and dremel.  ?-Patient/POA to call should there be question/concern in the interim.  ? ?Return in about 3 months (around 11/27/2021). ? ?Marzetta Board, DPM  ?

## 2021-09-08 ENCOUNTER — Telehealth: Payer: Self-pay

## 2021-09-08 NOTE — Progress Notes (Signed)
? ? ?Chronic Care Management ?Pharmacy Assistant  ? ?Name: Christian [name]  MRN: [id number] DOB: January 30, 1946 ? ? ?Reason for Encounter: Disease State-General  ?  ? ?Recent office visits:  ?None since the last coordination call 08/08/21 ? ?Recent consult visits:  ?08/27/21 Marzetta Board, DPM-Podiatry (Nail problem) Orders: For Home Use Only DME Diabetic Shoe; Medication changes: none ? ?Hospital visits:  ?None since the last coordination call 08/08/21 ? ?Medications: ?Outpatient Encounter Medications as of 09/08/2021  ?Medication Sig  ? albuterol (VENTOLIN HFA) 108 (90 Base) MCG/ACT inhaler TAKE 2 PUFFS BY MOUTH EVERY 6 HOURS AS NEEDED FOR WHEEZE OR SHORTNESS OF BREATH  ? aspirin EC 81 MG tablet Take 81 mg by mouth daily.  ? atorvastatin (LIPITOR) 20 MG tablet TAKE 1 TABLET BY MOUTH EVERY DAY IN THE EVENING  ? azithromycin (ZITHROMAX) 250 MG tablet Take as directed (Patient not taking: Reported on 07/23/2021)  ? budesonide-formoterol (SYMBICORT) 160-4.5 MCG/ACT inhaler Inhale 2 puffs into the lungs 2 (two) times daily.  ? clonazePAM (KLONOPIN) 0.5 MG disintegrating tablet TAKE 1 TABLET BY MOUTH TWICE A DAY  ? clopidogrel (PLAVIX) 75 MG tablet TAKE 1 TABLET BY MOUTH EVERY DAY  ? cromolyn (OPTICROM) 4 % ophthalmic solution Place 1 drop into both eyes every Wednesday. At bedtime  ? [facility]S ACETAMINOPHEN 325 MG tablet TAKE 2 CAPS BY MOUTH EVERY 6 HOURS AS NEEDED FOR PAIN FOR TEMP 100F OR ABOVE (Patient taking differently: Take 325 mg by mouth every 6 (six) hours as needed for mild pain or headache (temp over 100).)  ? [facility]S SENNA PLUS 8.6-50 MG tablet TAKE 2 TABLETS BY MOUTH AT BEDTIME FOR CONSTIPATION  ? ferrous sulfate 325 (65 FE) MG tablet Take 325 mg by mouth daily with breakfast.  ? fluticasone (FLONASE) 50 MCG/ACT nasal spray SPRAY 1 SPRAY INTO BOTH NOSTRILS DAILY.  ? furosemide (LASIX) 80 MG tablet Take 120 mg by mouth 2 (two) times daily.  ? hydrALAZINE (APRESOLINE) 100 MG tablet Take 1 tablet (100 mg total) by mouth 3  (three) times daily.  ? isosorbide dinitrate (ISORDIL) 30 MG tablet TAKE 1 TABLET BY MOUTH THREE TIMES A DAY  ? metoprolol succinate (TOPROL-XL) 50 MG 24 hr tablet TAKE 3 TABLETS BY MOUTH EVERY DAY  ? Multiple Vitamin (MULTIVITAMIN) tablet Take 1 tablet by mouth daily.  ? nitroGLYCERIN (NITROSTAT) 0.4 MG SL tablet Place 0.4 mg under the tongue every 5 (five) minutes as needed for chest pain.  ? oxyCODONE-acetaminophen (PERCOCET) 5-325 MG tablet Take 1 tablet by mouth every 6 (six) hours as needed for severe pain.  ? potassium chloride (KLOR-CON) 10 MEQ tablet Take 10 mEq by mouth daily.  ? PREVIDENT 5000 BOOSTER PLUS 1.1 % PSTE Take 1 application by mouth every evening.  ? sertraline (ZOLOFT) 25 MG tablet TAKE 1 TABLET ONE TIME A DAY  ? silodosin (RAPAFLO) 8 MG CAPS capsule TAKE 1 CAPSULE BY MOUTH EVERYDAY AT BEDTIME (Patient taking differently: Take 8 mg by mouth at bedtime.)  ? Vitamin D, Ergocalciferol, (DRISDOL) 1.25 MG (50000 UNIT) CAPS capsule TAKE 1 CAPSULE (50,000 UNITS TOTAL) BY MOUTH EVERY 7 (SEVEN) DAYS  ? ?No facility-administered encounter medications on file as of 09/08/2021.  ? ? ? [place] for General Review Call ? ? ?Chart Review: ? ?Have there been any documented new, changed, or discontinued medications since last visit? No (If yes, include name, dose, frequency, date) ?Has there been any documented recent hospitalizations or ED visits since last visit with  Clinical Pharmacist? No ?Brief Summary (including medication and/or Diagnosis changes): ? ? ?Adherence Review: ? ?Does the Clinical Pharmacist Assistant have access to adherence rates? Yes ?Adherence rates for STAR metric medications (List medication(s)/day supply/ last 2 fill dates). ?Adherence rates for medications indicated for disease state being reviewed (List medication(s)/day supply/ last 2 fill dates). ?Does the patient have >5 day gap between last estimated fill dates for any of the above medications or other medication  gaps? No ?Reason for medication gaps. ? ? ?Disease State Questions: ? ?Able to connect with Patient? Yes ? ?Did patient have any problems with their health recently? Yes ?Note problems and Concerns:Patient stated that back in January and February he had blood in stool. About week 1/2 ago blood in stool again and it was a heavy red color but later cleared up now normal stool. At the same time  the patient stated that  he stood up wrong and stumbled and fell but did not get hurt ? ?Have you had any admissions or emergency room visits or worsening of your condition(s) since last visit? No ?Details of ED visit, hospital visit and/or worsening condition(s): ? ?Have you had any visits with new specialists or providers since your last visit? No ?Explain: ? ?Have you had any new health care problem(s) since your last visit? Yes ?New problem(s) reported:Patient states that he has had some swelling in legs and feet. He states that when he lays down the swelling goes down but when he is moving around they swell up. He states that he is taking lasix  ? ?Have you run out of any of your medications since you last spoke with clinical pharmacist? No ?What caused you to run out of your medications? ? ?Are there any medications you are not taking as prescribed? No ?What kept you from taking your medications as prescribed? ? ?Are you having any issues or side effects with your medications? No ?Note of issues or side effects: ? ?Do you have any other health concerns or questions you want to discuss with your Clinical Pharmacist before your next visit? No ?Note additional concerns and questions from Patient. ? ?Are there any health concerns that you feel we can do a better job addressing? No ?Note Patient's response. ? ?Are you having any problems with any of the following since the last visit: (select all that apply) ? None ? Details: ? ?12. Any falls since last visit? No ? Details: ? ?13. Any increased or uncontrolled pain since last  visit? Yes ? Details:Patient stated that he was having some pain in chest on the right hand side that lasted all day but used spray that Dr. Erin Fulling prescribe and has not had since. Had hardness in chest on the right side that has moved to the center no pain  but when he lays on his side it bothers him but on back it is fine ? ?14. Next visit Type: telephone ?      Visit with:Clinical Pharmacist ?       Date: 11/25/21 ?       Time:1pm ? ?15. Additional Details? No ?   ?Care Gaps: ?Colonoscopy-09/26/08 ?Diabetic Foot Exam-05/28/21 ?Ophthalmology-05/30/20 ?Dexa Scan - NA ?Annual Well Visit - NA ?Micro albumin-NA ?Hemoglobin A1c- 03/25/20 ?  ?Star Rating Drugs: ?Atorvastatin 20 mg-last fill 07/09/21 90 ds  ?  ?Ethelene Hal ?Clinical Pharmacist Assistant ?[phone]  ? ?

## 2021-09-19 ENCOUNTER — Other Ambulatory Visit: Payer: Self-pay | Admitting: Urology

## 2021-10-04 ENCOUNTER — Other Ambulatory Visit: Payer: Self-pay | Admitting: Student

## 2021-11-05 ENCOUNTER — Other Ambulatory Visit: Payer: Self-pay | Admitting: Student

## 2021-11-16 ENCOUNTER — Other Ambulatory Visit: Payer: Self-pay | Admitting: Internal Medicine

## 2021-11-17 NOTE — Telephone Encounter (Signed)
Check [place] registry last filled 10/20/2021.Marland KitchenJohny Chess

## 2021-11-18 IMAGING — CR DG CHEST 2V
2 series · 2 of 2 positions shown · non-contrast
Comparison: July 24, 2019.

CLINICAL DATA: Chest pain.

EXAM:
CHEST - 2 VIEW

[chest lat]
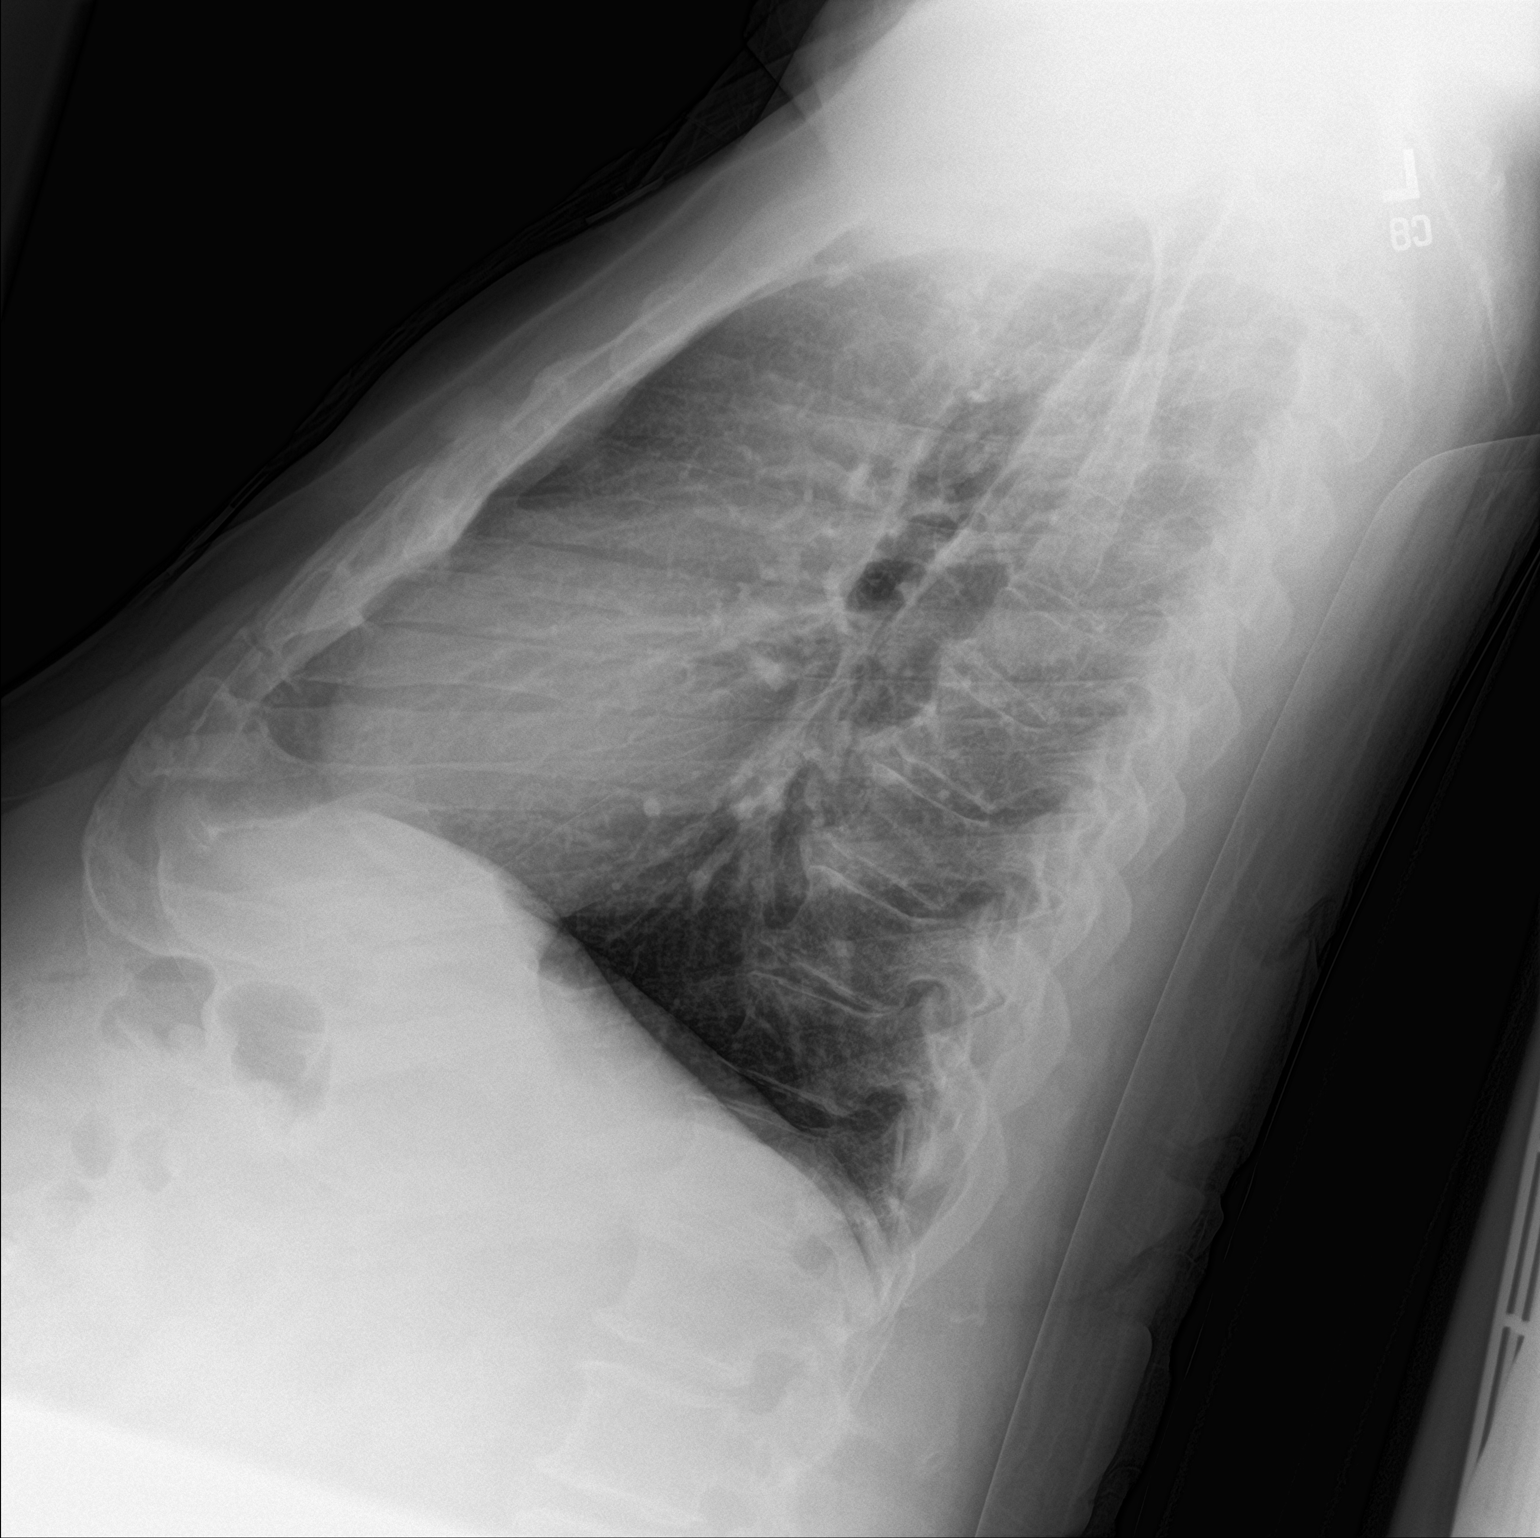

[chest ap]
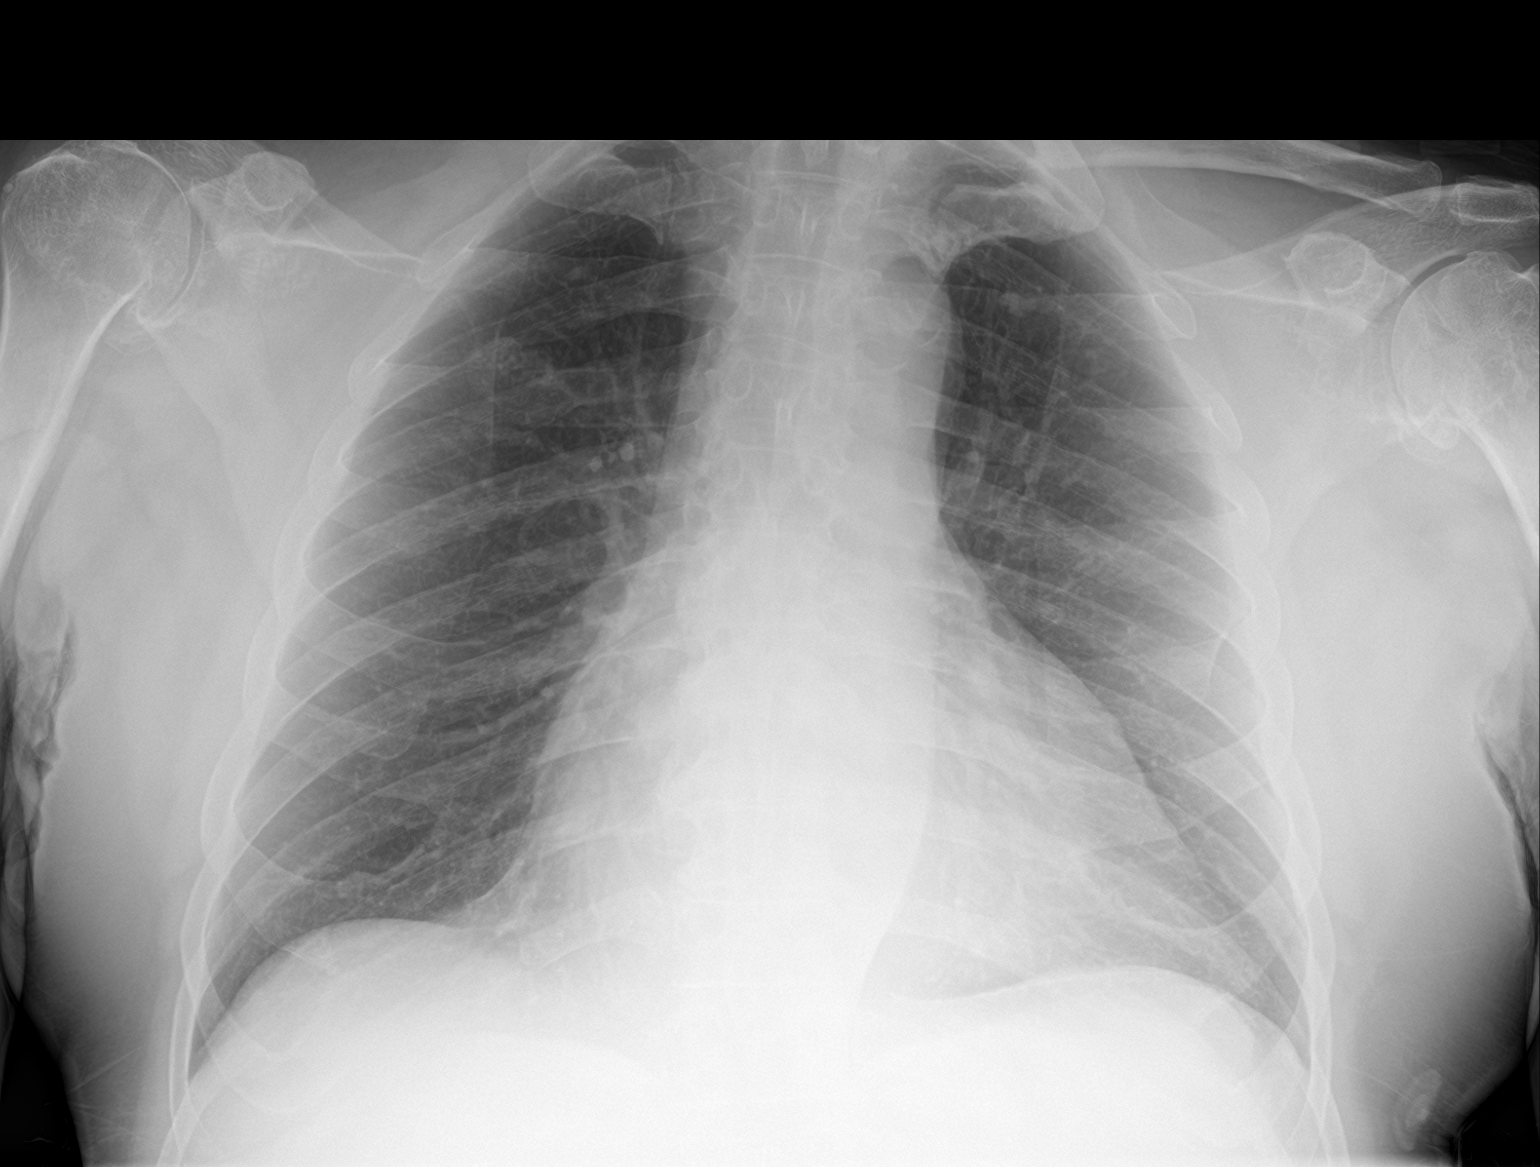

[2 of 2 positions shown; findings below may reference images not displayed]

FINDINGS: Stable cardiomegaly. No pneumothorax or pleural effusion is noted.
Both lungs are clear. The visualized skeletal structures are
unremarkable.
IMPRESSION: No active cardiopulmonary disease.

## 2021-11-24 IMAGING — DX DG CHEST 1V PORT
1 series · 1 of 1 positions shown · non-contrast
Comparison: August 09, 2019.

CLINICAL DATA: Status post thoracic surgery.

EXAM:
PORTABLE CHEST 1 VIEW

[chest ap]
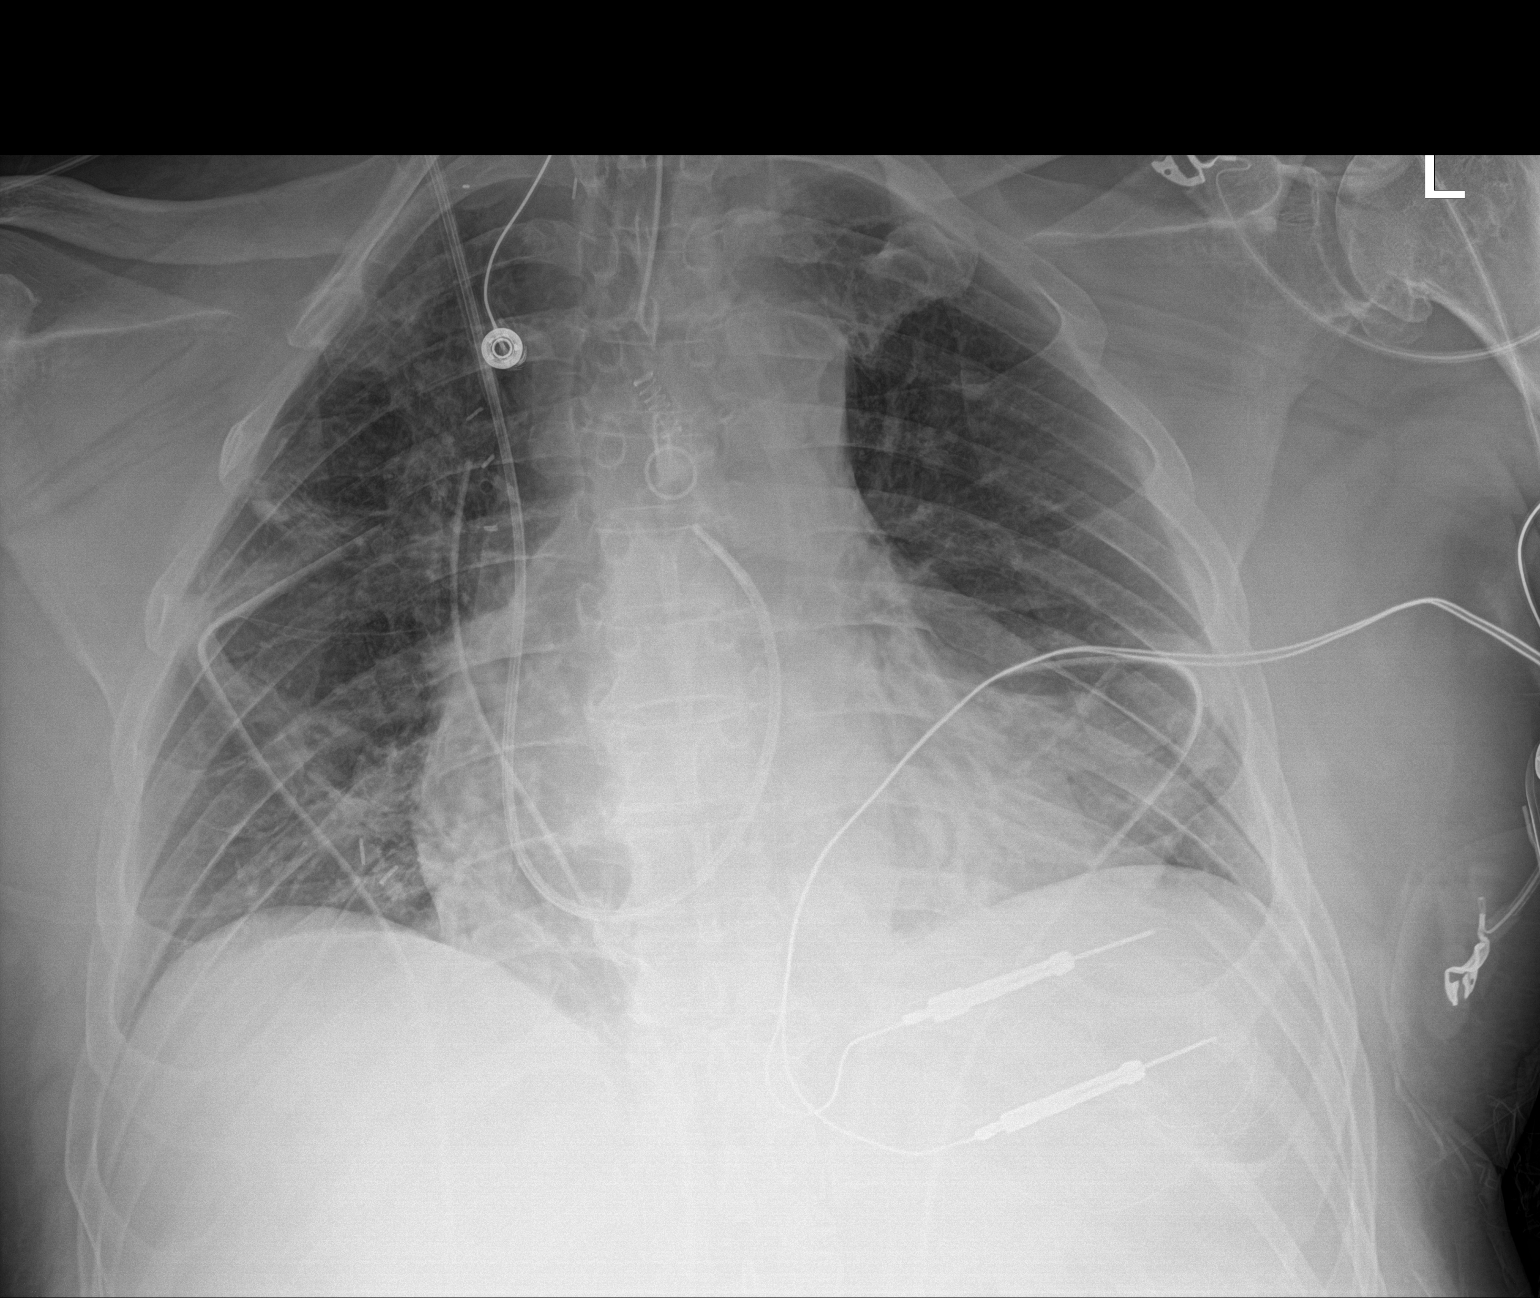

[1 of 1 positions shown; findings below may reference images not displayed]

FINDINGS: Stable cardiomediastinal silhouette. Endotracheal tube is in grossly
good position. Bilateral chest tubes are noted without pneumothorax.
Mild bibasilar subsegmental atelectasis is noted. Right internal
jugular catheter is noted with distal tip in expected position of
the SVC.
IMPRESSION: Bilateral chest tubes are noted without pneumothorax. Mild bibasilar
subsegmental atelectasis. Endotracheal tube in grossly good
position.

## 2021-11-25 ENCOUNTER — Telehealth: Payer: Medicare Other

## 2021-11-27 ENCOUNTER — Other Ambulatory Visit: Payer: Self-pay | Admitting: Cardiology

## 2021-11-27 ENCOUNTER — Other Ambulatory Visit: Payer: Self-pay | Admitting: Student

## 2021-11-27 DIAGNOSIS — I119 Hypertensive heart disease without heart failure: Secondary | ICD-10-CM

## 2021-11-27 DIAGNOSIS — I25118 Atherosclerotic heart disease of native coronary artery with other forms of angina pectoris: Secondary | ICD-10-CM

## 2021-11-27 IMAGING — DX DG CHEST 1V
1 series · 1 of 1 positions shown · non-contrast
Comparison: Radiograph earlier this day.

CLINICAL DATA: History of open heart surgery. CABG 08/15/2019

EXAM:
CHEST  1 VIEW

[chest ap]
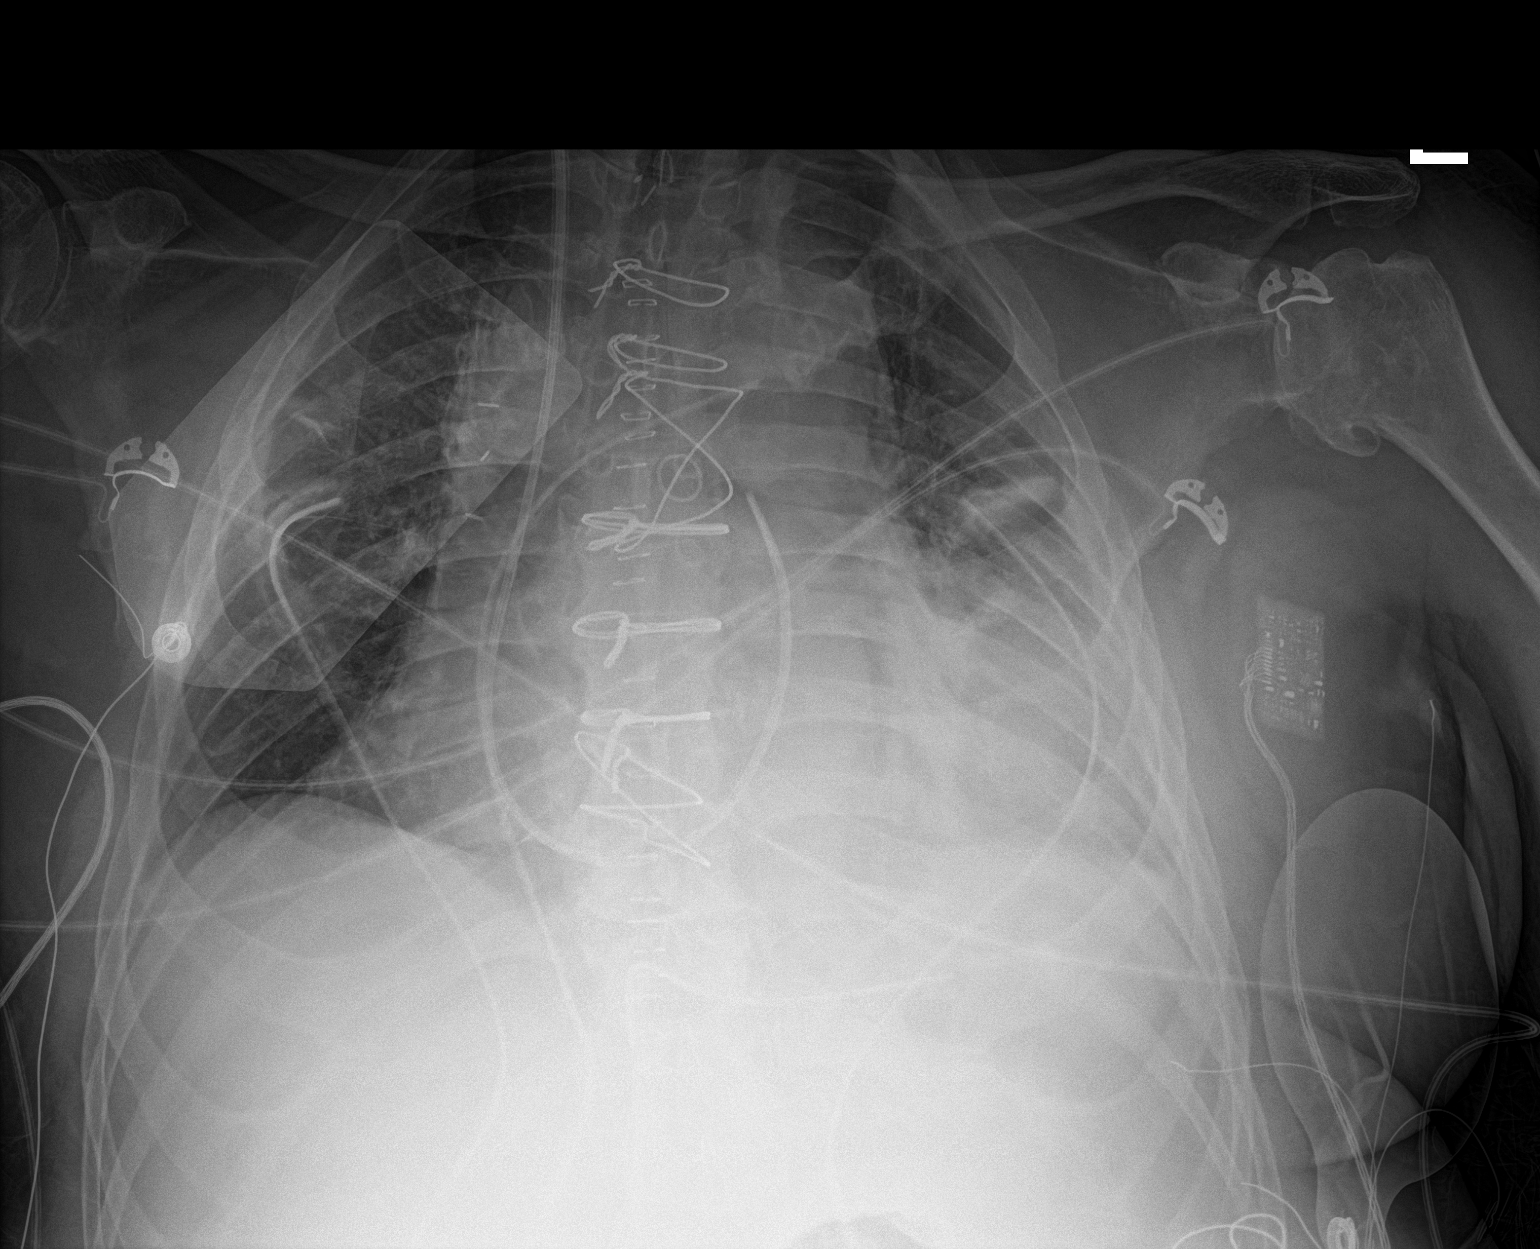

[1 of 1 positions shown; findings below may reference images not displayed]

FINDINGS: Interval extubation and removal of enteric tube. Slightly lower lung
volumes. Right internal jugular Swan-Ganz catheter remains in place
with tip in the region of the main pulmonary outflow tract. Chest
tubes and mediastinal drains remain in place. Recent median
sternotomy with skin staples in place. Stable cardiomegaly.
Worsening hazy opacity in the left lower lung zone, likely
combination of pleural fluid and atelectasis/airspace disease.
Minimal scarring in the right mid lung. No visualized pneumothorax.
IMPRESSION: 1. Interval extubation.  Slightly lower lung volumes.
2. Worsening hazy opacity in the left lower lung zone, likely
combination of pleural fluid and atelectasis/airspace disease.
3. Stable cardiomegaly.

## 2021-11-27 IMAGING — DX DG CHEST 1V PORT
1 series · 1 of 1 positions shown · non-contrast
Comparison: August 18, 2019

CLINICAL DATA: Myocardial infarction.  OG tube.

EXAM:
PORTABLE CHEST 1 VIEW

[chest ap]
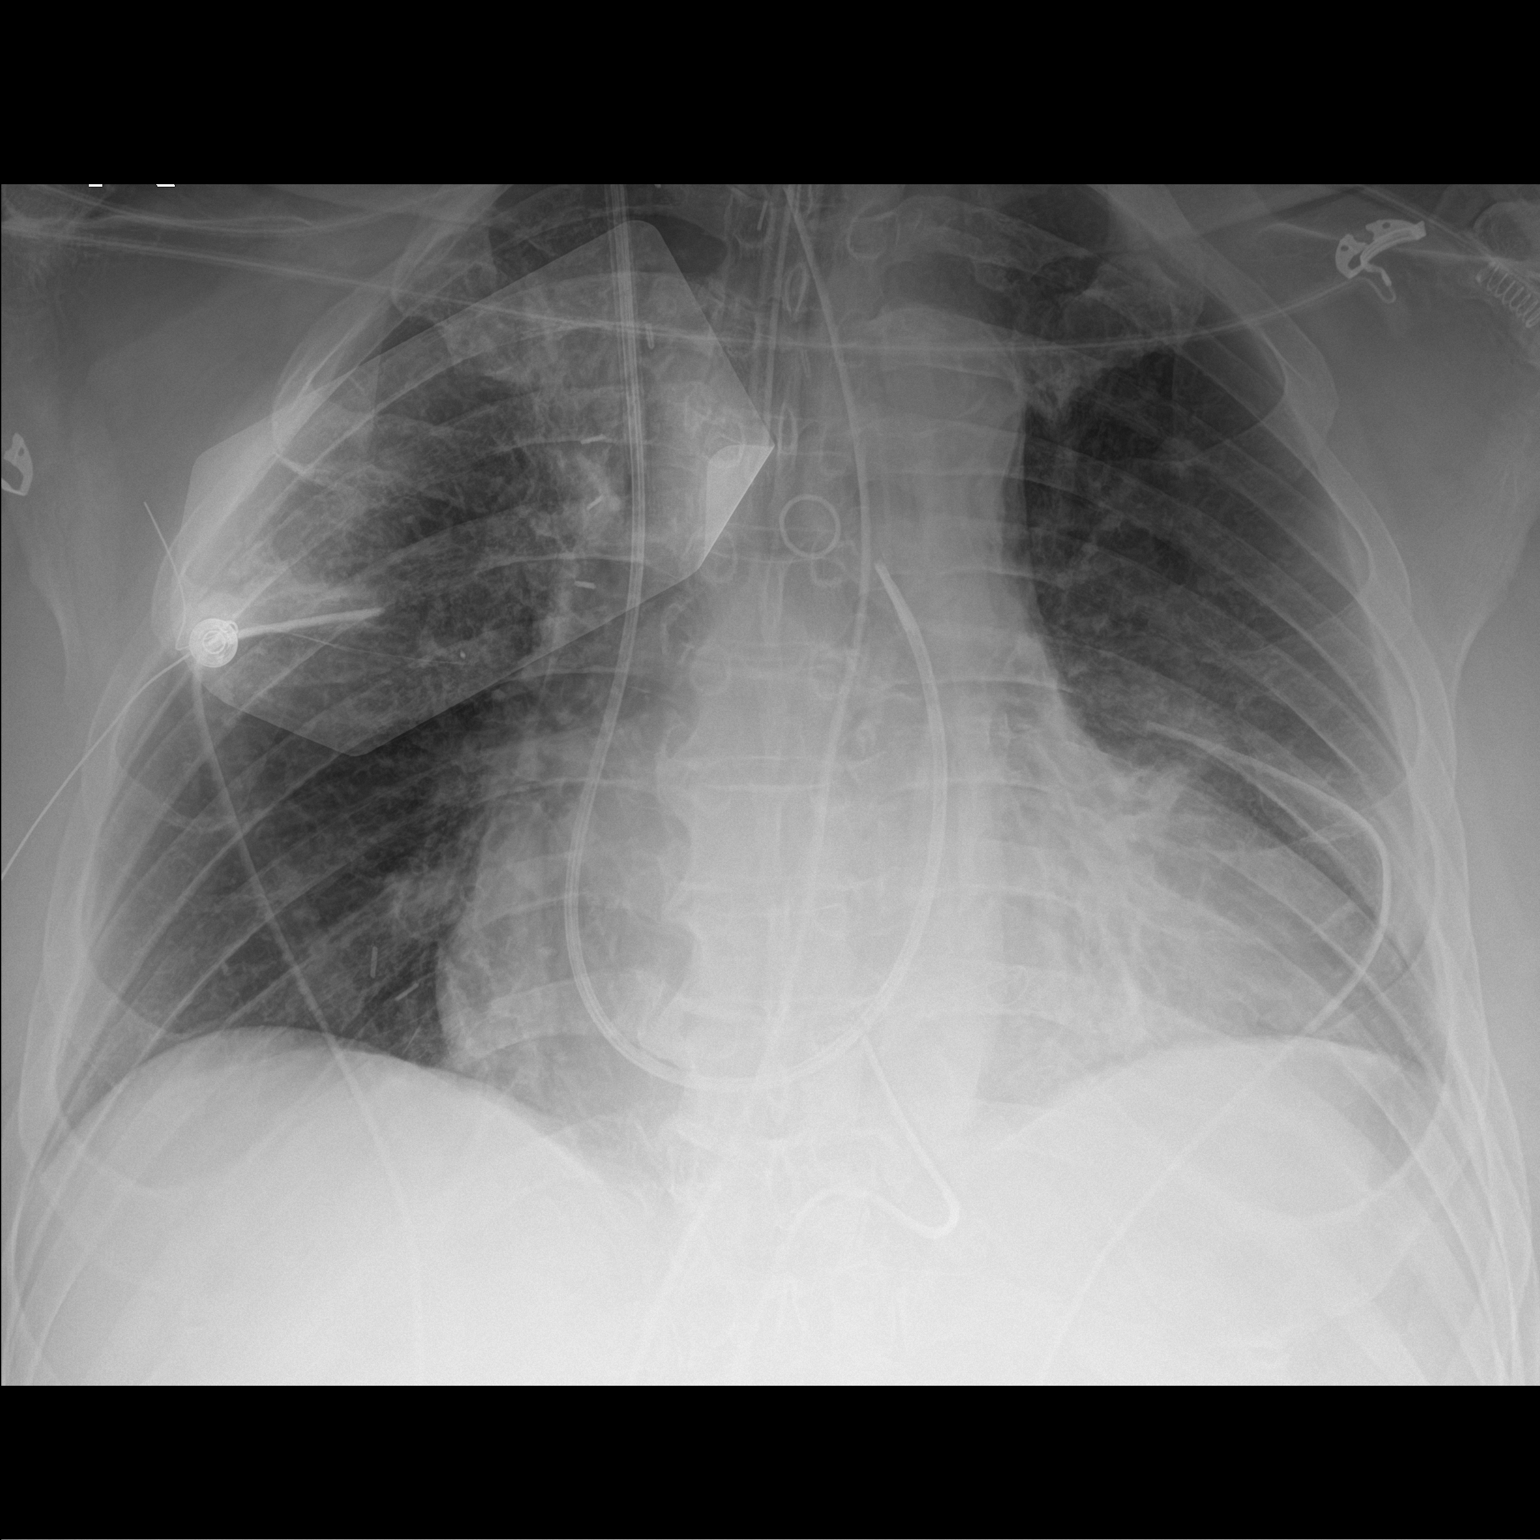

[1 of 1 positions shown; findings below may reference images not displayed]

FINDINGS: The ETT is in good position. A PA catheter is stable. Bilateral
chest tubes are noted. A mediastinal drain and appears to been
advanced, terminating centrally over the upper minutes Dyna. No OG
tube identified. No pneumothorax. A transcutaneous lead partially
obscures the right upper chest. Mild atelectasis in the left base.
Stable cardiomediastinal silhouette.
IMPRESSION: 1. Support apparatus as above. Apparent advancement of the
mediastinal drain with the distal tip projected over the superior
mediastinum at midline. No OG tube seen. Recommend clinical
correlation.
2. No other acute interval changes.

## 2021-11-28 ENCOUNTER — Ambulatory Visit: Payer: Medicare Other | Admitting: Podiatry

## 2021-11-28 ENCOUNTER — Other Ambulatory Visit: Payer: Medicare Other

## 2021-12-08 ENCOUNTER — Other Ambulatory Visit: Payer: Self-pay | Admitting: Pulmonary Disease

## 2021-12-15 ENCOUNTER — Encounter: Payer: Self-pay | Admitting: Podiatry

## 2021-12-15 ENCOUNTER — Ambulatory Visit (INDEPENDENT_AMBULATORY_CARE_PROVIDER_SITE_OTHER): Payer: Medicare Other | Admitting: Podiatry

## 2021-12-15 ENCOUNTER — Other Ambulatory Visit: Payer: Medicare Other

## 2021-12-15 DIAGNOSIS — M79675 Pain in left toe(s): Secondary | ICD-10-CM | POA: Diagnosis not present

## 2021-12-15 DIAGNOSIS — M79674 Pain in right toe(s): Secondary | ICD-10-CM | POA: Diagnosis not present

## 2021-12-15 DIAGNOSIS — E1142 Type 2 diabetes mellitus with diabetic polyneuropathy: Secondary | ICD-10-CM | POA: Diagnosis not present

## 2021-12-15 DIAGNOSIS — G35 Multiple sclerosis: Secondary | ICD-10-CM

## 2021-12-15 DIAGNOSIS — B351 Tinea unguium: Secondary | ICD-10-CM | POA: Diagnosis not present

## 2021-12-22 ENCOUNTER — Ambulatory Visit (INDEPENDENT_AMBULATORY_CARE_PROVIDER_SITE_OTHER): Payer: Medicare Other | Admitting: Pulmonary Disease

## 2021-12-22 VITALS — BP 126/74 | HR 86 | Temp 97.7°F | Ht 69.0 in | Wt 225.6 lb

## 2021-12-22 DIAGNOSIS — J984 Other disorders of lung: Secondary | ICD-10-CM | POA: Diagnosis not present

## 2021-12-22 DIAGNOSIS — J683 Other acute and subacute respiratory conditions due to chemicals, gases, fumes and vapors: Secondary | ICD-10-CM | POA: Diagnosis not present

## 2021-12-22 DIAGNOSIS — R0982 Postnasal drip: Secondary | ICD-10-CM | POA: Diagnosis not present

## 2021-12-22 NOTE — Progress Notes (Unsigned)
Synopsis: Referred in January 2022 for shortness of breath by Lawerance Cruel, PA  Subjective:   PATIENT ID: Christian [name] GENDER: male DOB: 09/08/1945, MRN: [id number]  HPI  Chief Complaint  Patient presents with   Follow-up    DOE slightly worse since the last visit. He was winded walking from lobby to exam room today. He says he is no longer wheezing or having soreness in his chest. He has some non prod cough.    Christian [name] is a 76 year old male, former smoker with hypertension, CAD s/p CABG 2021, congestive heart failure and pulmonary embolism in 2021 who returns to pulmonary clinic for shortness of breath.   He reports his sinus congestion and drainage has improved since starting fluticasone nasal spray. He denies issues with wheezing or soreness in his chest. He feels the symbicort has helped. He continues to have exertional dyspnea. He has not been using his albuterol inhaler much.  OV 06/25/21 He was started on symbicort 160-4.41mg 2 puffs twice daily at last visit without much improvement. He continues to experience intermittent wheezing. He reports having cold like symptoms over the past week with increased sinus congestion, drainage, cough and wheezing. He denies fevers and sweats but is having some chills. He has some central chest discomfort.  Chest radiograph from last visit shows persistent left loculated pleural effusion.   PFTs today show moderate restriction and moderate diffusion defect.   OV 05/22/21 Cardiology note reviewed from 05/08/21. He had extremely complex postop CABG complications including cardiogenic shock, cardiac arrest, needing internal CPR in the ICU after opening the chest, A. Fib needing cardioversion, closure of chest wall, developed acute renal failure leading temporary need for dialysis, right brain infarct with left hemiparesis, and multiple blood transfusions. Echocardiogram 11/2020 revealed mildly reduced LVEF at 45-50%, therefore patient  underwent stress test which was overall low risk.    He was noted to have left loculated pleural effusion based on chest imaging after his prolonged hospitalization. He reports having migratory chest discomforts that can be on the right side of his chest and move to the middle of his chest. He also experiences intermittent chest tightness. He has intermittent wheezing.   He reports trouble swallowing since his hospitalization in 2021. His wife reports he will eat reclined in his chair at times and have increased coughing when eating.   He reports second hand smoke exposure from his grandfather in childhood. He smoked for 3-4 years when he was in the [employer] He worked in a [employer] he was exposed to second hand smoke and he later worked for the post [employer].   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  LIMA TO LAD, RIMA TO PDA, RADIAL ARTERY TO CIRC AND SEQUENTIALLY TO OM1. 08/15/2019   Hyperlipidemia    Hypertension    Incomplete bladder emptying    Myocardial infarction ([facility] 08/09/2019   Nocturia    PE (pulmonary thromboembolism) ([facility] 08/09/2019   small RLL PE 08/09/19   Pneumonia    as a child   Pre-diabetes    Problems with swallowing pt states test at [facility] approx 2012 shows a  gastric valve  dysfunction--  eats small bites and drink liquids slowly   SOB (shortness of breath) on exertion      Family History  Problem Relation Age of Onset   Diabetes Mother    Hypertension Mother    Diabetes Father    Diabetes Brother    Hypertension Brother    Bone cancer Brother    Diabetes Brother      Social History   Socioeconomic History   Marital status: Married    Spouse name: Not on file   Number of children: 2    Years of education: Not on file   Highest education level: Associate degree: academic program  Occupational History   Occupation: Retired  Tobacco Use   Smoking status: Former    Packs/day: 0.50    Years: 20.00    Total pack years: 10.00    Types: Cigarettes    Quit date: 1975    Years since quitting: 48.6   Smokeless tobacco: Never  Vaping Use   Vaping Use: Never used  Substance and Sexual Activity   Alcohol use: Not Currently    Comment: very rarely - every now and then 1 drink/1 year if that   Drug use: No   Sexual activity: Not Currently  Other Topics Concern   Not on file  Social History Narrative   Not on file   Social Determinants of Health   Financial Resource Strain: Low Risk  (07/23/2021)   Overall Financial Resource Strain (CARDIA)    Difficulty of Paying Living Expenses: Not hard at all  Food Insecurity: No Food Insecurity (07/23/2021)   Hunger Vital Sign    Worried About Running Out of Food in the Last Year: Never true    [place] in the Last Year: Never true  Transportation Needs: No Transportation Needs (07/23/2021)   PRAPARE - Hydrologist (Medical): No    Lack of Transportation (Non-Medical): No  Physical Activity: Inactive (07/23/2021)   Exercise Vital Sign    Days of Exercise per Week: 0 days    Minutes of Exercise per Session: 0 min  Stress: No Stress Concern Present (07/23/2021)   Delia    Feeling of Stress : Not at all  Social Connections: Kimberly (07/23/2021)   Social Connection and Isolation Panel [NHANES]    Frequency of Communication with Friends and Family: More than three times a week    Frequency of Social Gatherings with Friends and Family: More than three times a week    Attends Religious Services: More than 4 times per year    Active Member of [employer] or Organizations: Yes    Attends Music therapist: More than  4 times per year    Marital Status: Married  Human resources officer Violence: Not At Risk (07/23/2021)   Humiliation, Afraid, Rape, and Kick questionnaire    Fear of Current or Ex-Partner: No    Emotionally Abused: No    Physically Abused: No    Sexually Abused: No     No Known Allergies   Outpatient Medications Prior to Visit  Medication Sig Dispense Refill   albuterol (VENTOLIN HFA) 108 (90 Base) MCG/ACT inhaler TAKE 2 PUFFS BY MOUTH EVERY 6 HOURS AS NEEDED FOR WHEEZE OR SHORTNESS OF BREATH 8.5 each 3   aspirin EC 81 MG tablet Take 81 mg by mouth daily.     atorvastatin (LIPITOR) 20 MG tablet TAKE  1 TABLET BY MOUTH EVERY DAY IN THE EVENING 90 tablet 3   budesonide-formoterol (SYMBICORT) 160-4.5 MCG/ACT inhaler INHALE 2 PUFFS INTO THE LUNGS TWICE A DAY 10.2 each 6   clonazePAM (KLONOPIN) 0.5 MG disintegrating tablet TAKE 1 TABLET BY MOUTH TWICE A DAY 60 tablet 3   clopidogrel (PLAVIX) 75 MG tablet TAKE 1 TABLET BY MOUTH EVERY DAY 90 tablet 1   cromolyn (OPTICROM) 4 % ophthalmic solution Place 1 drop into both eyes every Wednesday. At bedtime     [employer] ACETAMINOPHEN 325 MG tablet TAKE 2 CAPS BY MOUTH EVERY 6 HOURS AS NEEDED FOR PAIN FOR TEMP 100F OR ABOVE (Patient taking differently: Take 325 mg by mouth every 6 (six) hours as needed for mild pain or headache (temp over 100).) 60 tablet 0   [employer] SENNA PLUS 8.6-50 MG tablet TAKE 2 TABLETS BY MOUTH AT BEDTIME FOR CONSTIPATION 60 tablet 1   ferrous sulfate 325 (65 FE) MG tablet Take 325 mg by mouth daily with breakfast.     fluticasone (FLONASE) 50 MCG/ACT nasal spray SPRAY 1 SPRAY INTO BOTH NOSTRILS DAILY. 16 mL 2   furosemide (LASIX) 80 MG tablet Take 120 mg by mouth 2 (two) times daily.     isosorbide dinitrate (ISORDIL) 30 MG tablet TAKE 1 TABLET BY MOUTH THREE TIMES A DAY 270 tablet 1   metoprolol succinate (TOPROL-XL) 50 MG 24 hr tablet TAKE 3 TABLETS BY MOUTH EVERY DAY 90 tablet 1   Multiple Vitamin (MULTIVITAMIN) tablet Take 1 tablet by mouth  daily.     nitroGLYCERIN (NITROSTAT) 0.4 MG SL tablet Place 0.4 mg under the tongue every 5 (five) minutes as needed for chest pain.     potassium chloride (KLOR-CON) 10 MEQ tablet Take 10 mEq by mouth daily.     PREVIDENT 5000 BOOSTER PLUS 1.1 % PSTE Take 1 application by mouth every evening.     sertraline (ZOLOFT) 25 MG tablet TAKE 1 TABLET ONE TIME A DAY 90 tablet 1   silodosin (RAPAFLO) 8 MG CAPS capsule Take 1 capsule (8 mg total) by mouth at bedtime. 90 capsule 3   Vitamin D, Ergocalciferol, (DRISDOL) 1.25 MG (50000 UNIT) CAPS capsule TAKE 1 CAPSULE (50,000 UNITS TOTAL) BY MOUTH EVERY 7 (SEVEN) DAYS 12 capsule 3   hydrALAZINE (APRESOLINE) 100 MG tablet Take 1 tablet (100 mg total) by mouth 3 (three) times daily. 270 tablet 3   azithromycin (ZITHROMAX) 250 MG tablet Take as directed (Patient not taking: Reported on 07/23/2021) 6 tablet 0   No facility-administered medications prior to visit.   Review of Systems  Constitutional:  Negative for chills, fever, malaise/fatigue and weight loss.  HENT:  Negative for congestion, sinus pain and sore throat.   Eyes: Negative.   Respiratory:  Positive for cough and shortness of breath. Negative for hemoptysis, sputum production and wheezing.   Cardiovascular:  Negative for chest pain, palpitations, orthopnea, claudication and leg swelling.  Gastrointestinal:  Negative for abdominal pain, heartburn, nausea and vomiting.  Genitourinary: Negative.   Musculoskeletal:  Negative for joint pain and myalgias.  Skin:  Negative for rash.  Neurological:  Negative for weakness.  Endo/Heme/Allergies: Negative.   Psychiatric/Behavioral: Negative.      Objective:   Vitals:   12/22/21 1447  BP: 126/74  Pulse: 86  Temp: 97.7 F (36.5 C)  TempSrc: Oral  SpO2: 97%  Weight: 225 lb 9.6 oz (102.3 kg)  Height: '5\' 9"'$  (1.753 m)   Physical Exam Constitutional:  General: He is not in acute distress.    Appearance: He is obese.  HENT:     Head:  Normocephalic and atraumatic.  Cardiovascular:     Rate and Rhythm: Normal rate and regular rhythm.     Pulses: Normal pulses.     Heart sounds: Normal heart sounds. No murmur heard. Pulmonary:     Breath sounds: Examination of the left-lower field reveals decreased breath sounds. Decreased breath sounds present. No wheezing, rhonchi or rales.  Musculoskeletal:     Right lower leg: No edema.     Left lower leg: No edema.  Skin:    General: Skin is warm and dry.  Neurological:     General: No focal deficit present.     Mental Status: He is alert.  Psychiatric:        Mood and Affect: Mood normal.        Behavior: Behavior normal.        Thought Content: Thought content normal.        Judgment: Judgment normal.     CBC    Component Value Date/Time   WBC 8.0 05/08/2021 1201   WBC 8.4 12/12/2020 0336   RBC 3.68 (L) 05/08/2021 1201   RBC 3.55 (L) 12/12/2020 0336   HGB 11.9 (L) 05/08/2021 1201   HCT 35.7 (L) 05/08/2021 1201   PLT 205 05/08/2021 1201   MCV 97 05/08/2021 1201   MCH 32.3 05/08/2021 1201   MCH 33.0 12/12/2020 0336   MCHC 33.3 05/08/2021 1201   MCHC 30.7 12/12/2020 0336   RDW 13.6 05/08/2021 1201   LYMPHSABS 1.6 12/11/2020 1533   MONOABS 0.8 12/11/2020 1533   EOSABS 0.1 12/11/2020 1533   BASOSABS 0.1 12/11/2020 1533      Latest Ref Rng & Units 05/22/2021    3:51 PM 12/16/2020   12:22 AM 12/15/2020   12:45 AM  BMP  Glucose 70 - 99 mg/dL 177  127  204   BUN 8 - 27 mg/dL 35  38  40   Creatinine 0.76 - 1.27 mg/dL 2.73  2.52  2.76   BUN/Creat Ratio 10 - 24 13     Sodium 134 - 144 mmol/L 146  139  138   Potassium 3.5 - 5.2 mmol/L 3.9  3.9  3.6   Chloride 96 - 106 mmol/L 105  105  106   CO2 20 - 29 mmol/L '24  23  25   '$ Calcium 8.6 - 10.2 mg/dL 9.1  8.9  8.6    Chest imaging: CXR 05/22/21 Unchanged appearance of the chest x-ray from the prior, with chronic left pleural thickening/fluid and no definite evidence of acute cardiopulmonary disease.  CXR  12/23/20 Persistent and unchanged loculated parapneumonic effusion with associated left basilar atelectasis versus infiltrate. Stable cardiomegaly. Patient is status post median sternotomy with evidence of prior multivessel CABG. The right lung remains clear. No acute osseous abnormality.  CT Chest wo contrast 12/12/20 1. Cluster of ground-glass nodular density in the right lower lobe most consistent with atypical pneumonia. Small, chronic appearing bilateral pleural effusions, left greater than right. 2. Partial consolidative changes of the left lung base. 3. Aortic Atherosclerosis   PFT:    Latest Ref Rng & Units 06/25/2021   10:41 AM 08/11/2019    1:59 PM  PFT Results  FVC-Pre L 1.56  1.83   FVC-Predicted Pre % 43  50   FVC-Post L 1.56    FVC-Predicted Post % 43    Pre FEV1/FVC % %  88  81   Post FEV1/FCV % % 88    FEV1-Pre L 1.37  1.48   FEV1-Predicted Pre % 51  54   FEV1-Post L 1.36    DLCO uncorrected ml/min/mmHg 14.20    DLCO UNC% % 57    DLCO corrected ml/min/mmHg 15.52    DLCO COR %Predicted % 63    DLVA Predicted % 134    TLC L 3.32    TLC % Predicted % 48    RV % Predicted % 65    PFT 2023: Moderate restriction and moderate diffusion  Labs:  Path:  Echo 12/12/20: LV EF 45-50%. RV function and size is normal.   Heart Catheterization 08/10/19: LM: Normal LAD: Ostial 95% stenosis Ramus: Ostial/prox 30% stenosis LCx: Ostial 75% stenosis RCA: Prox-mid diffuse 40% calcific disease         Distal RCA 70% ISR   LVEDP 17 mmHg  NM Cardiac Stress Test 01/20/21 Lexiscan nuclear stress test performed using 1-day protocol. Normal myocardial perfusion. Stress LVEF calculated 49%, although visually appears normal. Low risk study.  Assessment & Plan:   Reactive airways dysfunction syndrome (HCC)  Post-nasal drip  Restrictive lung disease  Discussion: Christian [name] is a 76 year old male, former smoker with hypertension, CAD s/p CABG 2021, congestive heart  failure and pulmonary embolism in 2021 who returns to pulmonary clinic for shortness of breath.   His shortness of breath is multifactorial in setting of heart failure with reduced EF, trapped lung physiology of the left lower lobe and reactive airways disease.   He is to continue ICS/LABA therapy with symbicort 160-4.30mg 2 puffs twice daily and continue as needed albuterol.   He is to continue flonase nasal spray, 2 sprays per nostril daily for sinus congestion and drainage.   We discussed the importance of avoiding high sodium intake given his history of heart failure and complaints of intermittently swelling and the dyspnea.  Follow up in 1 year.  JFreda Jackson MD [facility] Office: [phone]  Current Outpatient Medications:    albuterol (VENTOLIN HFA) 108 (90 Base) MCG/ACT inhaler, TAKE 2 PUFFS BY MOUTH EVERY 6 HOURS AS NEEDED FOR WHEEZE OR SHORTNESS OF BREATH, Disp: 8.5 each, Rfl: 3   aspirin EC 81 MG tablet, Take 81 mg by mouth daily., Disp: , Rfl:    atorvastatin (LIPITOR) 20 MG tablet, TAKE 1 TABLET BY MOUTH EVERY DAY IN THE EVENING, Disp: 90 tablet, Rfl: 3   budesonide-formoterol (SYMBICORT) 160-4.5 MCG/ACT inhaler, INHALE 2 PUFFS INTO THE LUNGS TWICE A DAY, Disp: 10.2 each, Rfl: 6   clonazePAM (KLONOPIN) 0.5 MG disintegrating tablet, TAKE 1 TABLET BY MOUTH TWICE A DAY, Disp: 60 tablet, Rfl: 3   clopidogrel (PLAVIX) 75 MG tablet, TAKE 1 TABLET BY MOUTH EVERY DAY, Disp: 90 tablet, Rfl: 1   cromolyn (OPTICROM) 4 % ophthalmic solution, Place 1 drop into both eyes every Wednesday. At bedtime, Disp: , Rfl:    CVS ACETAMINOPHEN 325 MG tablet, TAKE 2 CAPS BY MOUTH EVERY 6 HOURS AS NEEDED FOR PAIN FOR TEMP 100F OR ABOVE (Patient taking differently: Take 325 mg by mouth every 6 (six) hours as needed for mild pain or headache (temp over 100).), Disp: 60 tablet, Rfl: 0   CVS SENNA PLUS 8.6-50 MG tablet, TAKE 2 TABLETS BY MOUTH AT BEDTIME FOR CONSTIPATION, Disp:  60 tablet, Rfl: 1   ferrous sulfate 325 (65 FE) MG tablet, Take 325 mg by mouth daily with breakfast., Disp: ,  Rfl:    fluticasone (FLONASE) 50 MCG/ACT nasal spray, SPRAY 1 SPRAY INTO BOTH NOSTRILS DAILY., Disp: 16 mL, Rfl: 2   furosemide (LASIX) 80 MG tablet, Take 120 mg by mouth 2 (two) times daily., Disp: , Rfl:    isosorbide dinitrate (ISORDIL) 30 MG tablet, TAKE 1 TABLET BY MOUTH THREE TIMES A DAY, Disp: 270 tablet, Rfl: 1   metoprolol succinate (TOPROL-XL) 50 MG 24 hr tablet, TAKE 3 TABLETS BY MOUTH EVERY DAY, Disp: 90 tablet, Rfl: 1   Multiple Vitamin (MULTIVITAMIN) tablet, Take 1 tablet by mouth daily., Disp: , Rfl:    nitroGLYCERIN (NITROSTAT) 0.4 MG SL tablet, Place 0.4 mg under the tongue every 5 (five) minutes as needed for chest pain., Disp: , Rfl:    potassium chloride (KLOR-CON) 10 MEQ tablet, Take 10 mEq by mouth daily., Disp: , Rfl:    PREVIDENT 5000 BOOSTER PLUS 1.1 % PSTE, Take 1 application by mouth every evening., Disp: , Rfl:    sertraline (ZOLOFT) 25 MG tablet, TAKE 1 TABLET ONE TIME A DAY, Disp: 90 tablet, Rfl: 1   silodosin (RAPAFLO) 8 MG CAPS capsule, Take 1 capsule (8 mg total) by mouth at bedtime., Disp: 90 capsule, Rfl: 3   Vitamin D, Ergocalciferol, (DRISDOL) 1.25 MG (50000 UNIT) CAPS capsule, TAKE 1 CAPSULE (50,000 UNITS TOTAL) BY MOUTH EVERY 7 (SEVEN) DAYS, Disp: 12 capsule, Rfl: 3   hydrALAZINE (APRESOLINE) 100 MG tablet, Take 1 tablet (100 mg total) by mouth 3 (three) times daily., Disp: 270 tablet, Rfl: 3

## 2021-12-22 NOTE — Patient Instructions (Signed)
Continue symbicort 2 puffs twice a day - rinse mouth out after each use - Continue to use with spacer   Use albuterol inhaler 1-2 breaths every 4-6 hours as needed   Continue use flonase nasal spray, sprays per nostril daily for sinus congestion  Follow up in 1 year

## 2021-12-22 NOTE — Progress Notes (Signed)
  Subjective:  Patient ID: Christian [name], male    DOB: Dec 02, 1945,  MRN: [id number]  76 y.o. male presents at risk foot care with h/o multiple sclerosis and neuropathy and painful thick toenails that are difficult to trim. Pain interferes with ambulation. Aggravating factors include wearing enclosed shoe gear. Pain is relieved with periodic professional debridement.  New problem(s): None   PCP is Christian Koch, MD , and last visit was 05/16/2021.  No Known Allergies  Review of Systems: Negative except as noted in the HPI.   Objective:  Mr. Age is a pleasant 76 y.o. male, WD, WN in NAD. AAO x 3.  Vascular Examination: Vascular status intact b/l with palpable DP pulses; faintly palpable PT pulses b/l. CFT <3 seconds b/l. No edema. No pain with calf compression b/l. Skin temperature gradient WNL b/l. No cyanosis or clubbing noted b/l LE.  Neurological Examination: Protective sensation intact with 10 gram monofilament right lower extremity. Protective sensation decreased with 10 gram monofilament left lower extremity. Vibratory sensation intact right lower extremity. Vibratory sensation diminished left lower extremity.  Dermatological Examination: Pedal skin with normal turgor, texture and tone b/l. Toenails right great toe and 2-5 b/l thick, discolored, elongated with subungual debris and pain on dorsal palpation. No hyperkeratotic lesions noted b/l. Anonychia noted left great toe. Nailbed(s) epithelialized.   Musculoskeletal Examination: Muscle strength 5/5 to b/l LE. Pes planus deformity noted bilateral LE. Utilizes cane for ambulation assistance.  Radiographs: None  Assessment:   1. Pain due to onychomycosis of toenails of both feet   2. MULTIPLE SCLEROSIS   3. Diabetic polyneuropathy associated with type 2 diabetes mellitus (Christian [name])    Plan:  -Examined patient. -No new findings. No new orders. -Patient to continue soft, supportive shoe gear daily. -Mycotic toenails  2-5 bilaterally and R hallux were debrided in length and girth with sterile nail nippers and dremel without iatrogenic bleeding. -Patient/POA to call should there be question/concern in the interim.  Return in about 3 months (around 03/17/2022).  Marzetta Board, DPM

## 2021-12-23 ENCOUNTER — Encounter: Payer: Self-pay | Admitting: Pulmonary Disease

## 2021-12-31 DIAGNOSIS — N3281 Overactive bladder: Secondary | ICD-10-CM | POA: Diagnosis not present

## 2021-12-31 DIAGNOSIS — N401 Enlarged prostate with lower urinary tract symptoms: Secondary | ICD-10-CM | POA: Diagnosis not present

## 2021-12-31 DIAGNOSIS — R350 Frequency of micturition: Secondary | ICD-10-CM | POA: Diagnosis not present

## 2021-12-31 DIAGNOSIS — R3912 Poor urinary stream: Secondary | ICD-10-CM | POA: Diagnosis not present

## 2022-01-01 ENCOUNTER — Other Ambulatory Visit: Payer: Self-pay

## 2022-01-01 DIAGNOSIS — I119 Hypertensive heart disease without heart failure: Secondary | ICD-10-CM

## 2022-01-01 MED ORDER — HYDRALAZINE HCL 100 MG PO TABS: 100.0000 mg | ORAL_TABLET | Freq: Three times a day (TID) | ORAL | 3 refills | Status: DC

## 2022-01-05 ENCOUNTER — Ambulatory Visit (INDEPENDENT_AMBULATORY_CARE_PROVIDER_SITE_OTHER): Payer: Medicare Other | Admitting: Internal Medicine

## 2022-01-05 ENCOUNTER — Ambulatory Visit: Payer: Medicare Other | Admitting: Internal Medicine

## 2022-01-05 ENCOUNTER — Encounter: Payer: Self-pay | Admitting: Internal Medicine

## 2022-01-05 VITALS — BP 126/77 | HR 95 | Temp 98.2°F | Resp 16 | Ht 69.0 in | Wt 222.2 lb

## 2022-01-05 VITALS — BP 127/68 | HR 88 | Temp 97.9°F | Ht 69.0 in | Wt 222.0 lb

## 2022-01-05 DIAGNOSIS — I255 Ischemic cardiomyopathy: Secondary | ICD-10-CM

## 2022-01-05 DIAGNOSIS — E1142 Type 2 diabetes mellitus with diabetic polyneuropathy: Secondary | ICD-10-CM | POA: Diagnosis not present

## 2022-01-05 DIAGNOSIS — D631 Anemia in chronic kidney disease: Secondary | ICD-10-CM

## 2022-01-05 DIAGNOSIS — Z0000 Encounter for general adult medical examination without abnormal findings: Secondary | ICD-10-CM

## 2022-01-05 DIAGNOSIS — I10 Essential (primary) hypertension: Secondary | ICD-10-CM | POA: Diagnosis not present

## 2022-01-05 DIAGNOSIS — Z23 Encounter for immunization: Secondary | ICD-10-CM | POA: Diagnosis not present

## 2022-01-05 DIAGNOSIS — L299 Pruritus, unspecified: Secondary | ICD-10-CM

## 2022-01-05 DIAGNOSIS — M7989 Other specified soft tissue disorders: Secondary | ICD-10-CM | POA: Diagnosis not present

## 2022-01-05 DIAGNOSIS — Z951 Presence of aortocoronary bypass graft: Secondary | ICD-10-CM | POA: Diagnosis not present

## 2022-01-05 DIAGNOSIS — I119 Hypertensive heart disease without heart failure: Secondary | ICD-10-CM | POA: Insufficient documentation

## 2022-01-05 DIAGNOSIS — E1169 Type 2 diabetes mellitus with other specified complication: Secondary | ICD-10-CM

## 2022-01-05 DIAGNOSIS — R0602 Shortness of breath: Secondary | ICD-10-CM | POA: Diagnosis not present

## 2022-01-05 DIAGNOSIS — N184 Chronic kidney disease, stage 4 (severe): Secondary | ICD-10-CM

## 2022-01-05 DIAGNOSIS — I700 Atherosclerosis of aorta: Secondary | ICD-10-CM | POA: Diagnosis not present

## 2022-01-05 DIAGNOSIS — E785 Hyperlipidemia, unspecified: Secondary | ICD-10-CM | POA: Diagnosis not present

## 2022-01-05 DIAGNOSIS — Z86711 Personal history of pulmonary embolism: Secondary | ICD-10-CM

## 2022-01-05 LAB — VITAMIN D 25 HYDROXY (VIT D DEFICIENCY, FRACTURES): VITD: 43.51 ng/mL (ref 30.00–100.00)

## 2022-01-05 LAB — CBC
HCT: 38.9 % — ABNORMAL LOW (ref 39.0–52.0)
Hemoglobin: 12.6 g/dL — ABNORMAL LOW (ref 13.0–17.0)
MCHC: 32.4 g/dL (ref 30.0–36.0)
MCV: 100.8 fl — ABNORMAL HIGH (ref 78.0–100.0)
Platelets: 176 10*3/uL (ref 150.0–400.0)
RBC: 3.86 Mil/uL — ABNORMAL LOW (ref 4.22–5.81)
RDW: 15.2 % (ref 11.5–15.5)
WBC: 9.7 10*3/uL (ref 4.0–10.5)

## 2022-01-05 LAB — VITAMIN B12: Vitamin B-12: 419 pg/mL (ref 211–911)

## 2022-01-05 LAB — COMPREHENSIVE METABOLIC PANEL
ALT: 36 U/L (ref 0–53)
AST: 35 U/L (ref 0–37)
Albumin: 3.7 g/dL (ref 3.5–5.2)
Alkaline Phosphatase: 92 U/L (ref 39–117)
BUN: 33 mg/dL — ABNORMAL HIGH (ref 6–23)
CO2: 25 mEq/L (ref 19–32)
Calcium: 8.7 mg/dL (ref 8.4–10.5)
Chloride: 107 mEq/L (ref 96–112)
Creatinine, Ser: 2.63 mg/dL — ABNORMAL HIGH (ref 0.40–1.50)
GFR: 23.18 mL/min — ABNORMAL LOW (ref >60.00)
Glucose, Bld: 161 mg/dL — ABNORMAL HIGH (ref 70–99)
Potassium: 3.6 mEq/L (ref 3.5–5.1)
Sodium: 143 mEq/L (ref 135–145)
Total Bilirubin: 0.3 mg/dL (ref 0.2–1.2)
Total Protein: 6.7 g/dL (ref 6.0–8.3)

## 2022-01-05 LAB — MICROALBUMIN / CREATININE URINE RATIO
Creatinine,U: 93 mg/dL
Microalb Creat Ratio: 1 mg/g (ref 0.0–30.0)
Microalb, Ur: 0.9 mg/dL (ref 0.0–1.9)

## 2022-01-05 LAB — HEMOGLOBIN A1C: Hgb A1c MFr Bld: 7.5 % — ABNORMAL HIGH (ref 4.6–6.5)

## 2022-01-05 LAB — LIPID PANEL
Cholesterol: 121 mg/dL (ref 0–200)
HDL: 57.3 mg/dL (ref >39.00)
LDL Cholesterol: 31 mg/dL (ref 0–99)
NonHDL: 64.01
Total CHOL/HDL Ratio: 2
Triglycerides: 164 mg/dL — ABNORMAL HIGH (ref 0.0–149.0)
VLDL: 32.8 mg/dL (ref 0.0–40.0)

## 2022-01-05 LAB — TSH: TSH: 0.94 u[IU]/mL (ref 0.35–5.50)

## 2022-01-05 NOTE — Patient Instructions (Addendum)
We will have you stop senna plus. Give this 2-3 days and then add fiber if still having bowel movements too often.  We are checking the labs today.

## 2022-01-05 NOTE — Progress Notes (Signed)
Primary Physician/Referring:  Hoyt Koch, MD  Patient ID: Christian [name], male    DOB: 1945-07-30, 76 y.o.   MRN: [id number]  No chief complaint on file.  HPI:    Christian [name]  is a 76 y.o. AA male with hypertension, hyperlipidemia, DM with stage 3 CKD,  CAD with  stenting to the PDA on 02/27/2014. Patient presented with unstable angina and NSTEMI on 08/09/2019 along with a very small subsegmental pulmonary embolism and discharged on 10/18/2019 after he underwent CABG x4 on 08/15/2019.   He had extremely complex postop complications including cardiogenic shock, cardiac arrest, needing internal CPR in the ICU after opening the chest, A. Fib needing cardioversion, closure office chest wall, developed acute renal failure leading to need for permanent dialysis, right brain infarct with left hemiparesis, multiple blood transfusions.  He was also placed on dialysis however is now off of dialysis since 02/01/2020. Echocardiogram 11/2020 revealed mildly reduced LVEF at 45-50%, therefore patient underwent stress test which was overall low risk.    Repeat echo in 2023 showed low normal ejection fraction.  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  LIMA TO LAD, RIMA TO PDA, RADIAL ARTERY TO CIRC AND SEQUENTIALLY TO OM1. 08/15/2019   Hyperlipidemia    Hypertension    Incomplete bladder emptying    Myocardial infarction ([place]) 08/09/2019   Nocturia    PE (pulmonary thromboembolism) ([place]) 08/09/2019   small RLL PE 08/09/19   Pneumonia    as a child   Pre-diabetes    Problems with swallowing pt states test at [facility] approx 2012 shows a gastric valve  dysfunction--  eats small bites and drink liquids slowly   SOB (shortness of breath)  on exertion    Past Surgical History:  Procedure Laterality Date   APPLICATION OF WOUND VAC N/A 08/24/2019   Procedure: APPLICATION OF WOUND VAC;  Surgeon: [name], MD;  Location: [place];  Service: Thoracic;  Laterality: N/A;   APPLICATION OF WOUND VAC  08/29/2019   Procedure: Wound Vac change;  Surgeon: [name], MD;  Location: [place] OR;  Service: Open Heart Surgery;;   APPLICATION OF WOUND VAC N/A 09/04/2019   Procedure: Application Of Wound Vac;  Surgeon: Grace Fransico, MD;  Location: [place];  Service: Open Heart Surgery;  Laterality: N/A;   APPLICATION OF WOUND VAC N/A 09/06/2019   Procedure: APPLICATION OF ACELL, APPLICATION OF WOUND VAC USING PREVENA INCISIONAL  DRESSING;  Surgeon: Wallace Going, DO;  Location: [place];  Service: Plastics;  Laterality: N/A;   AV FISTULA PLACEMENT Left 09/18/2020   Procedure: ARTERIOVENOUS (AV) FISTULA CREATION LEFT VERSUS GRAFT;  Surgeon: Serafina Mitchell, MD;  Location: [place];  Service: Vascular;  Laterality: Left;   Walker Left 10/31/2020   Procedure: LEFT SECOND STAGE [place];  Surgeon: Serafina Mitchell, MD;  [facility];  Service: Vascular;  Laterality: Left;   CARDIAC CATHETERIZATION     CORONARY ARTERY BYPASS GRAFT N/A 08/15/2019   Procedure: CORONARY ARTERY BYPASS GRAFTING (CABG), x 4 using bilateral IMAs and left radial artery .  LIMA TO LAD,  RIMA TO PDA, RADIAL ARTERY TO CIRC AND SEQUENTIALLY TO OM1.;  Surgeon: [name], MD;  Location[facility];  Service: Open Heart Surgery;  Laterality: N/A;   CORONARY STENT PLACEMENT  02/27/2014   distal rt/pd coronary       dr Einar Gip   CYSTO/ BLADDER BIOPSY'S/ CAUTHERIZATION  01-14-2004  DR Gaynelle Arabian   EXPLORATION POST OPERATIVE OPEN HEART N/A 08/16/2019   Procedure: Chest Closure S?P CABG WITH APPLICATION OF PREVENA  INCISIONAL WOUND VAC;  Surgeon: [name], MD;  Location: [place];  Service: Open Heart Surgery;  Laterality: N/A;    EXPLORATION POST OPERATIVE OPEN HEART N/A 08/21/2019   Procedure: CHEST WASHOUT S/P OPEN CHEST;  Surgeon: [name], MD;  Location: [place];  Service: Open Heart Surgery;  Laterality: N/A;  Open chest with Esmark dressing with Ioban sealant coverage.   EXPLORATION POST OPERATIVE OPEN HEART N/A 08/18/2019   Procedure: EXPLORATION POST OPERATIVE OPEN HEART (performed 04/23 on unit);  Surgeon: [name], MD;  Location: [place];  Service: Open Heart Surgery;  Laterality: N/A;   EXPLORATION POST OPERATIVE OPEN HEART N/A 08/24/2019   Procedure: CHEST WASHOUT POST OPERATIVE OPEN HEART;  Surgeon: [name], MD;  Location: [place];  Service: Open Heart Surgery;  Laterality: N/A;   EXPLORATION POST OPERATIVE OPEN HEART N/A 08/29/2019   Procedure: CHEST WOUND WASHOUT POST OPERATIVE OPEN HEART;  Surgeon: [name], MD;  Location: [place];  Service: Open Heart Surgery;  Laterality: N/A;   EXPLORATION POST OPERATIVE OPEN HEART N/A 09/04/2019   Procedure: MEDIASTINAL EXPLORATION WITH STERNAL WOUND IRRIGATION;  Surgeon: Grace Italo, MD;  Location: [place];  Service: Open Heart Surgery;  Laterality: N/A;   EXPLORATION POST OPERATIVE OPEN HEART N/A 09/14/2019   Procedure: EVACUATION OF HEMATOMA;  Surgeon: Grace Dandra, MD;  Location: [place];  Service: Open Heart Surgery;  Laterality: N/A;   IR FLUORO GUIDE CV LINE LEFT  09/19/2019   IR GASTROSTOMY TUBE MOD SED  10/04/2019   IR GASTROSTOMY TUBE REMOVAL  11/29/2019   IR PATIENT EVAL TECH 0-60 MINS  09/29/2019   IR US GUIDE VASC ACCESS LEFT  09/19/2019   LAPAROSCOPIC LYSIS OF ADHESIONS N/A 09/06/2019   Procedure: LAPAROSCOPIC OMENTAL HARVEST;  Surgeon: Mickeal Skinner, MD;  Location: [place];  Service: General;  Laterality: N/A;   LEFT HEART CATH AND CORONARY ANGIOGRAPHY N/A 08/10/2019   Procedure: LEFT HEART CATH AND CORONARY ANGIOGRAPHY;  Surgeon: Nigel Mormon, MD;  Location: [place] [facility];  Service: Cardiovascular;  Laterality:  N/A;   LEFT HEART CATHETERIZATION WITH CORONARY ANGIOGRAM N/A 02/27/2014   Procedure: LEFT HEART CATHETERIZATION WITH CORONARY ANGIOGRAM;  Surgeon: Laverda Page, MD;  Location: [facility] CATH LAB;  Service: Cardiovascular;  Laterality: N/A;   MEDIASTINAL EXPLORATION N/A 09/06/2019   Procedure: MEDIASTINAL EXPLORATION;  Surgeon: Grace Logon, MD;  Location: [place];  Service: Thoracic;  Laterality: N/A;   PECTORALIS FLAP  09/06/2019   Procedure: Pectoralis ADVANCEMENT Flap;  Surgeon: Wallace Going, DO;  Location: [place];  Service: Plastics;;   PERCUTANEOUS CORONARY STENT INTERVENTION (PCI-S)  02/27/2014   Procedure: PERCUTANEOUS CORONARY STENT INTERVENTION (PCI-S);  Surgeon: Laverda Page, MD;  Location: [facility] CATH LAB;  Service: Cardiovascular;;  rt PDA  3.0/28mm Promus stent   RADIAL ARTERY HARVEST Left 08/15/2019   Procedure: Radial Artery Harvest;  Surgeon: [name], MD;  Location: [place];  Service: Open Heart Surgery;  Laterality:  Left;   RIB PLATING N/A 09/06/2019   Procedure: STERNAL PLATING;  Surgeon: Grace Bashar, MD;  Location: [place];  Service: Thoracic;  Laterality: N/A;   TEE WITHOUT CARDIOVERSION N/A 08/15/2019   Procedure: TRANSESOPHAGEAL ECHOCARDIOGRAM (TEE);  Surgeon: [name], MD;  Location: [place];  Service: Open Heart Surgery;  Laterality: N/A;   TRACHEOSTOMY TUBE PLACEMENT  08/29/2019   Procedure: Tracheostomy;  Surgeon: [name], MD;  [facility];  Service: Open Heart Surgery; Decannulated 6/10/20211   TRANSURETHRAL RESECTION OF PROSTATE  04/04/2012   Procedure: TRANSURETHRAL RESECTION OF THE PROSTATE WITH GYRUS INSTRUMENTS;  Surgeon: Ailene Rud, MD;  Location: [facility];  Service: Urology;  Laterality: N/A;   TRANSURETHRAL RESECTION OF PROSTATE N/A 09/27/2014   Procedure: TRANSURETHRAL RESECTION OF THE PROSTATE ;  Surgeon: Carolan Clines, MD;  Location: [facility];  Service: Urology;  Laterality: N/A;   UPPER  GASTROINTESTINAL ENDOSCOPY     Family History  Problem Relation Age of Onset   Diabetes Mother    Hypertension Mother    Diabetes Father    Diabetes Brother    Hypertension Brother    Bone cancer Brother    Diabetes Brother    Social History   Tobacco Use   Smoking status: Former    Packs/day: 0.50    Years: 20.00    Total pack years: 10.00    Types: Cigarettes    Quit date: 1975    Years since quitting: 48.7   Smokeless tobacco: Never  Substance Use Topics   Alcohol use: Not Currently    Comment: very rarely - every now and then 1 drink/1 year if that   ROS   Review of Systems  Constitutional: Negative for malaise/fatigue (improved).  Cardiovascular:  Positive for chest pain, irregular heartbeat and palpitations. Negative for claudication, leg swelling, near-syncope, orthopnea, paroxysmal nocturnal dyspnea and syncope.  Respiratory:  Positive for shortness of breath (improved) and sleep disturbances due to breathing.   Neurological:  Positive for disturbances in coordination (left sided weakness).   Objective  Blood pressure 126/77, pulse 95, temperature 98.2 F (36.8 C), temperature source Temporal, resp. rate 16, height '5\' 9"'$  (1.753 m), weight 222 lb 3.2 oz (100.8 kg), SpO2 94 %.     01/05/2022   10:42 AM 12/22/2021    2:47 PM 07/03/2021   11:09 AM  Vitals with BMI  Height '5\' 9"'$  '5\' 9"'$    Weight 222 lbs 3 oz 225 lbs 10 oz   BMI 32.6 71.2   Systolic 458 099 833  Diastolic 77 74 77  Pulse 95 86 93   Orthostatic VS for the past 72 hrs (Last 3 readings):  Patient Position BP Location Cuff Size  01/05/22 1042 Sitting Right Arm Large     Physical Exam Vitals and nursing note reviewed.  Constitutional:      General: He is not in acute distress.    Comments: He is moderately built and mildly obese in no acute distress.  HENT:     Head: Normocephalic and atraumatic.  Neck:     Thyroid: No thyromegaly.  Cardiovascular:     Rate and Rhythm: Normal rate and regular  rhythm.     Pulses:          Carotid pulses are 2+ on the right side and 2+ on the left side.      Femoral pulses are 2+ on the right side and 2+ on the left side.  Popliteal pulses are 0 on the right side and 0 on the left side.       Dorsalis pedis pulses are 0 on the right side and 0 on the left side.       Posterior tibial pulses are 0 on the right side and 0 on the left side.     Heart sounds: Normal heart sounds, S1 normal and S2 normal. No murmur heard.    No gallop.     Comments: No JVD Pulmonary:     Effort: No respiratory distress.     Breath sounds: No wheezing, rhonchi or rales.  Chest:     Comments: Sternotomy scar has healed well Abdominal:     General: Bowel sounds are normal.  Musculoskeletal:     Right lower leg: Edema (minimal) present.     Left lower leg: Edema (minimal) present.  Neurological:     Mental Status: He is alert.   Laboratory examination:   Recent Labs    05/22/21 1551  NA 146*  K 3.9  CL 105  CO2 24  GLUCOSE 177*  BUN 35*  CREATININE 2.73*  CALCIUM 9.1   CrCl cannot be calculated (Patient's most recent lab result is older than the maximum 21 days allowed.).     Latest Ref Rng & Units 05/22/2021    3:51 PM 12/16/2020   12:22 AM 12/15/2020   12:45 AM  CMP  Glucose 70 - 99 mg/dL 177  127  204   BUN 8 - 27 mg/dL 35  38  40   Creatinine 0.76 - 1.27 mg/dL 2.73  2.52  2.76   Sodium 134 - 144 mmol/L 146  139  138   Potassium 3.5 - 5.2 mmol/L 3.9  3.9  3.6   Chloride 96 - 106 mmol/L 105  105  106   CO2 20 - 29 mmol/L '24  23  25   '$ Calcium 8.6 - 10.2 mg/dL 9.1  8.9  8.6   Total Protein 6.0 - 8.5 g/dL 6.9     Total Bilirubin 0.0 - 1.2 mg/dL 0.2     Alkaline Phos 44 - 121 IU/L 96     AST 0 - 40 IU/L 20     ALT 0 - 44 IU/L 25         Latest Ref Rng & Units 05/08/2021   12:01 PM 12/12/2020    3:36 AM 12/11/2020    3:33 PM  CBC  WBC 3.4 - 10.8 x10E3/uL 8.0  8.4  9.2   Hemoglobin 13.0 - 17.7 g/dL 11.9  11.7  10.8   Hematocrit 37.5 - 51.0  % 35.7  38.1  35.9   Platelets 150 - 450 x10E3/uL 205  218  203    Lipid Panel No results for input(s): "CHOL", "TRIG", "[place]", "VLDL", "HDL", "CHOLHDL", "LDLDIRECT" in the last 8760 hours.  HEMOGLOBIN A1C Lab Results  Component Value Date   HGBA1C 5.4 03/25/2020   MPG 94 12/20/2019   TSH No results for input(s): "TSH" in the last 8760 hours.  BNP    Component Value Date/Time   BNP 113.0 (H) 05/08/2021 1201   BNP 184.8 (H) 12/11/2020 1533   ProBNP    Component Value Date/Time   PROBNP 987 (H) 07/03/2021 1208   PROBNP 64.0 05/28/2020 1538   External labs 08/11/2019: HDL 42, LDL 40, total cholesterol 107, triglycerides 71 A1c 5.4%  Allergies  No Known Allergies   Medications Prior to Visit:   Outpatient Medications Prior to  Visit  Medication Sig Dispense Refill   albuterol (VENTOLIN HFA) 108 (90 Base) MCG/ACT inhaler TAKE 2 PUFFS BY MOUTH EVERY 6 HOURS AS NEEDED FOR WHEEZE OR SHORTNESS OF BREATH 8.5 each 3   aspirin EC 81 MG tablet Take 81 mg by mouth daily.     atorvastatin (LIPITOR) 20 MG tablet TAKE 1 TABLET BY MOUTH EVERY DAY IN THE EVENING 90 tablet 3   budesonide-formoterol (SYMBICORT) 160-4.5 MCG/ACT inhaler INHALE 2 PUFFS INTO THE LUNGS TWICE A DAY 10.2 each 6   clonazePAM (KLONOPIN) 0.5 MG disintegrating tablet TAKE 1 TABLET BY MOUTH TWICE A DAY 60 tablet 3   clopidogrel (PLAVIX) 75 MG tablet TAKE 1 TABLET BY MOUTH EVERY DAY 90 tablet 1   cromolyn (OPTICROM) 4 % ophthalmic solution Place 1 drop into both eyes every Wednesday. At bedtime     [employer] ACETAMINOPHEN 325 MG tablet TAKE 2 CAPS BY MOUTH EVERY 6 HOURS AS NEEDED FOR PAIN FOR TEMP 100F OR ABOVE (Patient taking differently: Take 325 mg by mouth every 6 (six) hours as needed for mild pain or headache (temp over 100).) 60 tablet 0   [employer] SENNA PLUS 8.6-50 MG tablet TAKE 2 TABLETS BY MOUTH AT BEDTIME FOR CONSTIPATION 60 tablet 1   ferrous sulfate 325 (65 FE) MG tablet Take 325 mg by mouth daily with breakfast.      fluticasone (FLONASE) 50 MCG/ACT nasal spray SPRAY 1 SPRAY INTO BOTH NOSTRILS DAILY. 16 mL 2   furosemide (LASIX) 80 MG tablet Take 120 mg by mouth 2 (two) times daily.     hydrALAZINE (APRESOLINE) 100 MG tablet Take 1 tablet (100 mg total) by mouth 3 (three) times daily. 270 tablet 3   isosorbide dinitrate (ISORDIL) 30 MG tablet TAKE 1 TABLET BY MOUTH THREE TIMES A DAY 270 tablet 1   metoprolol succinate (TOPROL-XL) 50 MG 24 hr tablet TAKE 3 TABLETS BY MOUTH EVERY DAY 90 tablet 1   Multiple Vitamin (MULTIVITAMIN) tablet Take 1 tablet by mouth daily.     nitroGLYCERIN (NITROSTAT) 0.4 MG SL tablet Place 0.4 mg under the tongue every 5 (five) minutes as needed for chest pain.     potassium chloride (KLOR-CON) 10 MEQ tablet Take 10 mEq by mouth daily.     PREVIDENT 5000 BOOSTER PLUS 1.1 % PSTE Take 1 application by mouth every evening.     sertraline (ZOLOFT) 25 MG tablet TAKE 1 TABLET ONE TIME A DAY 90 tablet 1   silodosin (RAPAFLO) 8 MG CAPS capsule Take 1 capsule (8 mg total) by mouth at bedtime. 90 capsule 3   Vitamin D, Ergocalciferol, (DRISDOL) 1.25 MG (50000 UNIT) CAPS capsule TAKE 1 CAPSULE (50,000 UNITS TOTAL) BY MOUTH EVERY 7 (SEVEN) DAYS 12 capsule 3   No facility-administered medications prior to visit.   Final Medications at End of Visit    Current Meds  Medication Sig   albuterol (VENTOLIN HFA) 108 (90 Base) MCG/ACT inhaler TAKE 2 PUFFS BY MOUTH EVERY 6 HOURS AS NEEDED FOR WHEEZE OR SHORTNESS OF BREATH   aspirin EC 81 MG tablet Take 81 mg by mouth daily.   atorvastatin (LIPITOR) 20 MG tablet TAKE 1 TABLET BY MOUTH EVERY DAY IN THE EVENING   budesonide-formoterol (SYMBICORT) 160-4.5 MCG/ACT inhaler INHALE 2 PUFFS INTO THE LUNGS TWICE A DAY   clonazePAM (KLONOPIN) 0.5 MG disintegrating tablet TAKE 1 TABLET BY MOUTH TWICE A DAY   clopidogrel (PLAVIX) 75 MG tablet TAKE 1 TABLET BY MOUTH EVERY DAY   cromolyn (OPTICROM)  4 % ophthalmic solution Place 1 drop into both eyes every  Wednesday. At bedtime   [employer]S ACETAMINOPHEN 325 MG tablet TAKE 2 CAPS BY MOUTH EVERY 6 HOURS AS NEEDED FOR PAIN FOR TEMP 100F OR ABOVE (Patient taking differently: Take 325 mg by mouth every 6 (six) hours as needed for mild pain or headache (temp over 100).)   [employer]S SENNA PLUS 8.6-50 MG tablet TAKE 2 TABLETS BY MOUTH AT BEDTIME FOR CONSTIPATION   ferrous sulfate 325 (65 FE) MG tablet Take 325 mg by mouth daily with breakfast.   fluticasone (FLONASE) 50 MCG/ACT nasal spray SPRAY 1 SPRAY INTO BOTH NOSTRILS DAILY.   furosemide (LASIX) 80 MG tablet Take 120 mg by mouth 2 (two) times daily.   hydrALAZINE (APRESOLINE) 100 MG tablet Take 1 tablet (100 mg total) by mouth 3 (three) times daily.   isosorbide dinitrate (ISORDIL) 30 MG tablet TAKE 1 TABLET BY MOUTH THREE TIMES A DAY   metoprolol succinate (TOPROL-XL) 50 MG 24 hr tablet TAKE 3 TABLETS BY MOUTH EVERY DAY   Multiple Vitamin (MULTIVITAMIN) tablet Take 1 tablet by mouth daily.   nitroGLYCERIN (NITROSTAT) 0.4 MG SL tablet Place 0.4 mg under the tongue every 5 (five) minutes as needed for chest pain.   potassium chloride (KLOR-CON) 10 MEQ tablet Take 10 mEq by mouth daily.   PREVIDENT 5000 BOOSTER PLUS 1.1 % PSTE Take 1 application by mouth every evening.   sertraline (ZOLOFT) 25 MG tablet TAKE 1 TABLET ONE TIME A DAY   silodosin (RAPAFLO) 8 MG CAPS capsule Take 1 capsule (8 mg total) by mouth at bedtime.   Vitamin D, Ergocalciferol, (DRISDOL) 1.25 MG (50000 UNIT) CAPS capsule TAKE 1 CAPSULE (50,000 UNITS TOTAL) BY MOUTH EVERY 7 (SEVEN) DAYS    Radiology:  No results found.  Cardiac Studies:   Heart catheterization Sep 05, 2019: LM: Normal LAD: Ostial 95% stenosis Ramus: Ostial/prox 30% stenosis LCx: Ostial 75% stenosis RCA: Prox-mid diffuse 40% calcific disease. Distal RCA 70% ISR of 3.0 by 28 mm Promus placed 02/27/2014.  Pre-CABG Dopplers including ABI and carotid duplex: No significant carotid artery stenosis.  Antegrade vertebral artery  flow. ABI normal, with triphasic waveforms at the level of the ankle.  Hx of CABG 08/15/2019: LIMA TO LAD, RIMA TO PDA, SEQUENTIAL RADIAL ARTERY TO Cx-OM1.  Bilateral lower extremity DVT study 12/12/2020: No evidence of DVT  PCV MYOCARDIAL PERFUSION WO LEXISCAN 01/20/2021 Lexiscan nuclear stress test performed using 1-day protocol. Normal myocardial perfusion. Stress LVEF calculated 49%, although visually appears normal. Low risk study.  Echocardiogram 05/12/2021:  Poor echo window. Wall motion with reduced sensitivity. Left ventricle  cavity is normal in size. Mild concentric remodeling of the left  ventricle. Normal global wall motion. Abnormal septal wall motion due to  post-operative coronary artery bypass graft. Doppler evidence of grade I  (impaired) diastolic dysfunction, normal LAP. Normal LV systolic function  with visual EF 50-55%. Calculated EF 56%.  No significant change from 07/22/2020.  EKG:   01/05/2022 - Sinus rhythm with 2 PVCs, iRBBB, old anterior infarct - no significant change as patient had CABG  07/03/2021: Sinus rhythm with 2 PVCs at a rate of 93 bpm.  Normal axis.  Incomplete right bundle branch block.  Poor R wave progression, cannot exclude anteroseptal infarct old.  No evidence of ischemia or underlying injury pattern.  05/08/2021: Sinus rhythm with PVCs at a rate of 97 bpm.  Normal axis.  Incomplete right bundle branch block.  Poor R wave progression, cannot exclude anteroseptal  infarct old.  Compared EKG 12/18/2020, no significant change.  12/18/2020: sinus rhythm with PVCs at a rate of 96 bpm.  Normal axis.  Incomplete right bundle branch block.  Poor R wave progression, cannot exclude anteroseptal infarct old.  Compared to EKG 07/11/2020, no significant change.  12/11/2020: Sinus rhythm at a rate of 88 bpm.  Left axis, incomplete right bundle branch block.  Poor R wave progression, cannot exclude anteroseptal infarct old.  Unchanged compared to EKG  07/11/2020.  Assessment     ICD-10-CM   1. Hx of CABG 08/15/2019: LIMA TO LAD, RIMA TO PDA, SEQUENTIAL RADIAL ARTERY TO Cx-OM1.  Z95.1 EKG 12-Lead    2. Hypertension with heart disease  I11.9     3. Hyperlipidemia LDL goal <70  E78.5       No orders of the defined types were placed in this encounter.   There are no discontinued medications.    Orders Placed This Encounter  Procedures   EKG 12-Lead    Recommendations:   Christian [name]  is a 76 y.o. AA male with hypertension, hyperlipidemia, DM with stage 3 CKD,  CAD s/p CABG x4 on 08/15/2019.  He is here for 6 month follow-up.  Continue current cardiac medications. Repeat nuclear stress test given chest pain and extensive CAD. Refer to sleep study, likely has OSA. Labs ordered, will call to discuss Encourage low-sodium diet, less than 2000 mg daily. Follow-up in 6 months or sooner if needed.      Floydene Flock, DO, [place] 01/05/2022, 10:47 AM Office: [phone]

## 2022-01-05 NOTE — Progress Notes (Signed)
Subjective:   Patient ID: Christian [name], male    DOB: 1945/12/05, 76 y.o.   MRN: [id number]  HPI Here for medicare wellness and physical, with new complaints. Please see A/P for status and treatment of chronic medical problems.   Diet: heart healthy Physical activity: sedentary Depression/mood screen: negative Hearing: intact to whispered voice Visual acuity: grossly normal with lens, performs annual eye exam  ADLs: capable Fall risk: none Home safety: good Cognitive evaluation: intact to orientation, naming, recall and repetition EOL planning: adv directives discussed, in place  [employer] Visit from 01/05/2022 in [place] at [facility]  PHQ-2 Total Score 3       [place] Visit from 01/05/2022 in [place] at [facility]  PHQ-9 Total Score 10         12/13/2020    9:15 PM 12/14/2020    8:25 PM 12/15/2020    7:45 PM 07/23/2021    2:11 PM 01/05/2022    1:50 PM  [place] in the past year?    1 0  Was there an injury with Fall?    0   Fall Risk Category Calculator    1   Fall Risk Category    Low   Patient Fall Risk Level Moderate fall risk Moderate fall risk Moderate fall risk Low fall risk   Fall risk Follow up    Falls evaluation completed     I have personally reviewed and have noted 1. The patient's medical and social history - reviewed today no changes 2. Their use of alcohol, tobacco or illicit drugs 3. Their current medications and supplements 4. The patient's functional ability including ADL's, fall risks, home safety risks and hearing or visual impairment. 5. Diet and physical activities 6. Evidence for depression or mood disorders 7. Care team reviewed and updated 8.  The patient is not on an opioid pain medication.  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  LIMA TO LAD, RIMA TO PDA, RADIAL ARTERY TO CIRC AND SEQUENTIALLY TO OM1. 08/15/2019   Hyperlipidemia    Hypertension    Incomplete bladder emptying    Myocardial infarction ([place]) 08/09/2019   Nocturia    PE (pulmonary thromboembolism) ([place]) 08/09/2019   small RLL PE 08/09/19   Pneumonia    as a child   Pre-diabetes    Problems with swallowing pt states test at [facility] approx 2012 shows a gastric valve  dysfunction--  eats small bites and drink liquids slowly   SOB (shortness of breath) on exertion    Past Surgical History:  Procedure Laterality Date   APPLICATION OF WOUND VAC N/A 08/24/2019   Procedure: APPLICATION OF WOUND VAC;  Surgeon: [name], MD;  [facility] OR;  Service: Thoracic;  Laterality: N/A;   APPLICATION OF WOUND VAC  08/29/2019   Procedure: Wound Vac change;  Surgeon: [name], MD;  Location: [facility] OR;  Service: Open Heart Surgery;;   APPLICATION OF WOUND VAC N/A 09/04/2019   Procedure: Application Of Wound Vac;  Surgeon: Grace Marquett, MD;  Location: [place];  Service: Open Heart Surgery;  Laterality: N/A;   APPLICATION OF WOUND VAC N/A 09/06/2019   Procedure: APPLICATION OF ACELL, APPLICATION OF WOUND VAC USING PREVENA INCISIONAL  DRESSING;  Surgeon: Wallace Going, DO;  Location: [place];  Service: Plastics;  Laterality: N/A;   AV FISTULA PLACEMENT Left 09/18/2020   Procedure: ARTERIOVENOUS (AV) FISTULA CREATION LEFT VERSUS GRAFT;  Surgeon: Serafina [place], MD;  Location: [place];  Service: Vascular;  Laterality: Left;    [place] Left 10/31/2020   Procedure: LEFT SECOND STAGE [place];  Surgeon: Serafina [place], MD;  [facility];  Service: Vascular;  Laterality: Left;   CARDIAC CATHETERIZATION     CORONARY ARTERY BYPASS GRAFT N/A 08/15/2019   Procedure: CORONARY ARTERY BYPASS GRAFTING (CABG), x 4 using bilateral IMAs and left radial artery .  LIMA TO LAD, RIMA TO PDA, RADIAL ARTERY TO CIRC AND SEQUENTIALLY TO OM1.;  Surgeon: [name], MD;  Location: [place];  Service: Open Heart Surgery;  Laterality: N/A;   CORONARY STENT PLACEMENT  02/27/2014   distal rt/pd coronary       dr Einar Gip   CYSTO/ BLADDER BIOPSY'S/ CAUTHERIZATION  01-14-2004  DR Gaynelle Arabian   EXPLORATION POST OPERATIVE OPEN HEART N/A 08/16/2019   Procedure: Chest Closure S?P CABG WITH APPLICATION OF PREVENA  INCISIONAL WOUND VAC;  Surgeon: [name], MD;  Location: [place];  Service: Open Heart Surgery;  Laterality: N/A;   EXPLORATION POST OPERATIVE OPEN HEART N/A 08/21/2019   Procedure: CHEST WASHOUT S/P OPEN CHEST;  Surgeon: [name], MD;  Location: [place];  Service: Open Heart Surgery;  Laterality: N/A;  Open chest with Esmark dressing with Ioban sealant coverage.   EXPLORATION POST OPERATIVE OPEN HEART N/A 08/18/2019   Procedure: EXPLORATION POST OPERATIVE OPEN HEART (performed 04/23 on unit);  Surgeon: [name], MD;  Location: [place];  Service: Open Heart Surgery;  Laterality: N/A;   EXPLORATION POST OPERATIVE OPEN HEART N/A 08/24/2019   Procedure: CHEST WASHOUT POST OPERATIVE OPEN HEART;  Surgeon: [name], MD;  Location: [place];  Service: Open Heart Surgery;  Laterality: N/A;   EXPLORATION POST OPERATIVE OPEN HEART N/A 08/29/2019   Procedure: CHEST WOUND WASHOUT POST OPERATIVE OPEN HEART;  Surgeon: [name], MD;  Location: [place];  Service: Open Heart Surgery;  Laterality: N/A;   EXPLORATION POST OPERATIVE OPEN HEART N/A 09/04/2019   Procedure: MEDIASTINAL EXPLORATION  WITH STERNAL WOUND IRRIGATION;  Surgeon: Grace Dimitri, MD;  Location: [place];  Service: Open Heart Surgery;  Laterality: N/A;   EXPLORATION POST OPERATIVE OPEN HEART N/A 09/14/2019   Procedure: EVACUATION OF HEMATOMA;  Surgeon: Grace Nathaneal, MD;  Location: [place];  Service: Open Heart Surgery;  Laterality: N/A;   IR FLUORO GUIDE CV LINE LEFT  09/19/2019   IR GASTROSTOMY TUBE MOD SED  10/04/2019   IR GASTROSTOMY TUBE REMOVAL  11/29/2019   IR PATIENT EVAL TECH 0-60 MINS  09/29/2019   IR US GUIDE VASC ACCESS LEFT  09/19/2019   LAPAROSCOPIC LYSIS OF ADHESIONS N/A 09/06/2019   Procedure: LAPAROSCOPIC OMENTAL HARVEST;  Surgeon: Mickeal Skinner, MD;  Location: [place];  Service: General;  Laterality: N/A;   LEFT HEART CATH AND  CORONARY ANGIOGRAPHY N/A 08/10/2019   Procedure: LEFT HEART CATH AND CORONARY ANGIOGRAPHY;  Surgeon: Nigel Mormon, MD;  Location: [place] CV LAB;  Service: Cardiovascular;  Laterality: N/A;   LEFT HEART CATHETERIZATION WITH CORONARY ANGIOGRAM N/A 02/27/2014   Procedure: LEFT HEART CATHETERIZATION WITH CORONARY ANGIOGRAM;  Surgeon: Laverda Page, MD;  Location: [facility] CATH LAB;  Service: Cardiovascular;  Laterality: N/A;   MEDIASTINAL EXPLORATION N/A 09/06/2019   Procedure: MEDIASTINAL EXPLORATION;  Surgeon: Grace Lizzie, MD;  Location: [place];  Service: Thoracic;  Laterality: N/A;   PECTORALIS FLAP  09/06/2019   Procedure: Pectoralis ADVANCEMENT Flap;  Surgeon: Wallace Going, DO;  Location: [place];  Service: Plastics;;   PERCUTANEOUS CORONARY STENT INTERVENTION (PCI-S)  02/27/2014   Procedure: PERCUTANEOUS CORONARY STENT INTERVENTION (PCI-S);  Surgeon: Laverda Page, MD;  Location[facility] CATH LAB;  Service: Cardiovascular;;  rt PDA  3.0/28mm Promus stent   RADIAL ARTERY HARVEST Left 08/15/2019   Procedure: Radial Artery Harvest;  Surgeon: [name], MD;  Location: [place];  Service: Open Heart Surgery;  Laterality: Left;   RIB PLATING N/A 09/06/2019    Procedure: STERNAL PLATING;  Surgeon: Grace Lenin, MD;  Location: [place];  Service: Thoracic;  Laterality: N/A;   TEE WITHOUT CARDIOVERSION N/A 08/15/2019   Procedure: TRANSESOPHAGEAL ECHOCARDIOGRAM (TEE);  Surgeon: [name], MD;  Location: [place];  Service: Open Heart Surgery;  Laterality: N/A;   TRACHEOSTOMY TUBE PLACEMENT  08/29/2019   Procedure: Tracheostomy;  Surgeon: [name], MD;  [facility];  Service: Open Heart Surgery; Decannulated 6/10/20211   TRANSURETHRAL RESECTION OF PROSTATE  04/04/2012   Procedure: TRANSURETHRAL RESECTION OF THE PROSTATE WITH GYRUS INSTRUMENTS;  Surgeon: Ailene Rud, MD;  Location: [facility];  Service: Urology;  Laterality: N/A;   TRANSURETHRAL RESECTION OF PROSTATE N/A 09/27/2014   Procedure: TRANSURETHRAL RESECTION OF THE PROSTATE ;  Surgeon: Carolan Clines, MD;  Location: [facility];  Service: Urology;  Laterality: N/A;   UPPER GASTROINTESTINAL ENDOSCOPY     Family History  Problem Relation Age of Onset   Diabetes Mother    Hypertension Mother    Diabetes Father    Diabetes Brother    Hypertension Brother    Bone cancer Brother    Diabetes Brother    Review of Systems  Constitutional:  Positive for activity change.  HENT: Negative.    Eyes: Negative.   Respiratory:  Positive for shortness of breath. Negative for cough and chest tightness.   Cardiovascular:  Positive for chest pain. Negative for palpitations and leg swelling.  Gastrointestinal:  Negative for abdominal distention, abdominal pain, constipation, diarrhea, nausea and vomiting.  Musculoskeletal: Negative.   Skin:  Positive for rash.  Neurological: Negative.   Psychiatric/Behavioral: Negative.      Objective:  Physical Exam Constitutional:      Appearance: He is well-developed.  HENT:     Head: Normocephalic and atraumatic.  Cardiovascular:     Rate and Rhythm: Normal rate and regular rhythm.  Pulmonary:     Effort: Pulmonary  effort is normal. No respiratory distress.     Breath sounds: Normal breath sounds. No wheezing or rales.  Abdominal:     General: Bowel sounds are normal. There is no distension.     Palpations: Abdomen is soft.     Tenderness: There is no abdominal tenderness. There is no rebound.  Musculoskeletal:     Cervical back: Normal range of motion.  Skin:  General: Skin is warm and dry.  Neurological:     Mental Status: He is alert and oriented to person, place, and time.     Coordination: Coordination normal.     Vitals:   01/05/22 1346  BP: 127/68  Pulse: 88  Temp: 97.9 F (36.6 C)  SpO2: 95%  Weight: 222 lb (100.7 kg)  Height: '5\' 9"'$  (1.753 m)    Assessment & Plan:  Flu shot given at visit

## 2022-01-06 NOTE — Assessment & Plan Note (Signed)
Stable and taking lasix 120 mg BID. Checking CMP.

## 2022-01-06 NOTE — Assessment & Plan Note (Signed)
BP at goal on

## 2022-01-06 NOTE — Assessment & Plan Note (Signed)
Was provoked due to surgery. On aspirin and plavix and no sign of recurrence.

## 2022-01-06 NOTE — Assessment & Plan Note (Signed)
Worsening and getting stress test soon.

## 2022-01-06 NOTE — Assessment & Plan Note (Signed)
With worsening chest pains and SOB. Getting stress test with cardiology in near future to assess. Prior CABG.

## 2022-01-06 NOTE — Assessment & Plan Note (Signed)
Previously was thought to be related to sugars/renal function. Checking CMP and HgA1c today. Also checking B12 and vitamin D and TSH to rule out metabolic causes. Suspect long fingernails are causing injury which maintains itching. Advised not to scratch. Does not desire to trim nails.

## 2022-01-06 NOTE — Assessment & Plan Note (Signed)
Checking lipid panel and adjust lipitor 20 mg daily for LDL goal <70.

## 2022-01-08 ENCOUNTER — Other Ambulatory Visit: Payer: Self-pay | Admitting: Internal Medicine

## 2022-01-08 NOTE — Telephone Encounter (Signed)
Please advise 

## 2022-01-08 NOTE — Assessment & Plan Note (Signed)
Taking aspirin and plavix and on statin. Continue.

## 2022-01-08 NOTE — Assessment & Plan Note (Signed)
Flu shot given. Covid-19 counseled. Pneumonia complete. Shingrix advised to get at pharmacy. Tetanus due 2027. Colonoscopy due. Counseled about sun safety and mole surveillance. Counseled about the dangers of distracted driving. Given 10 year screening recommendations.

## 2022-01-08 NOTE — Assessment & Plan Note (Signed)
Checking CBC. Adjust as needed.

## 2022-01-08 NOTE — Assessment & Plan Note (Signed)
Checking CMP.  

## 2022-01-08 NOTE — Assessment & Plan Note (Signed)
Checking HgA1c, microalbumin to creatinine ratio. Adjust as needed. Currently diet controlled. On statin. Adjust as needed.

## 2022-01-16 ENCOUNTER — Other Ambulatory Visit: Payer: Self-pay | Admitting: Pulmonary Disease

## 2022-01-16 DIAGNOSIS — R0982 Postnasal drip: Secondary | ICD-10-CM

## 2022-01-19 ENCOUNTER — Ambulatory Visit: Payer: Federal, State, Local not specified - PPO

## 2022-01-19 DIAGNOSIS — Z951 Presence of aortocoronary bypass graft: Secondary | ICD-10-CM | POA: Diagnosis not present

## 2022-01-19 DIAGNOSIS — I119 Hypertensive heart disease without heart failure: Secondary | ICD-10-CM

## 2022-01-19 DIAGNOSIS — E785 Hyperlipidemia, unspecified: Secondary | ICD-10-CM | POA: Diagnosis not present

## 2022-01-29 DIAGNOSIS — R3915 Urgency of urination: Secondary | ICD-10-CM | POA: Diagnosis not present

## 2022-01-29 DIAGNOSIS — R350 Frequency of micturition: Secondary | ICD-10-CM | POA: Diagnosis not present

## 2022-01-29 DIAGNOSIS — N3941 Urge incontinence: Secondary | ICD-10-CM | POA: Diagnosis not present

## 2022-02-05 DIAGNOSIS — N3941 Urge incontinence: Secondary | ICD-10-CM | POA: Diagnosis not present

## 2022-02-11 ENCOUNTER — Other Ambulatory Visit: Payer: Self-pay

## 2022-02-11 MED ORDER — METOPROLOL SUCCINATE ER 50 MG PO TB24: 150.0000 mg | ORAL_TABLET | Freq: Every day | ORAL | 1 refills | Status: DC

## 2022-02-12 DIAGNOSIS — R350 Frequency of micturition: Secondary | ICD-10-CM | POA: Diagnosis not present

## 2022-02-19 DIAGNOSIS — N3941 Urge incontinence: Secondary | ICD-10-CM | POA: Diagnosis not present

## 2022-02-25 ENCOUNTER — Telehealth: Payer: Self-pay | Admitting: Internal Medicine

## 2022-02-25 ENCOUNTER — Telehealth: Payer: Medicare Other

## 2022-02-25 NOTE — Telephone Encounter (Signed)
Patients wife called about appointment with the pharmacist today at [facility] and said they never heard anything. Please contact patient at [phone].

## 2022-02-26 DIAGNOSIS — R3915 Urgency of urination: Secondary | ICD-10-CM | POA: Diagnosis not present

## 2022-02-26 DIAGNOSIS — R350 Frequency of micturition: Secondary | ICD-10-CM | POA: Diagnosis not present

## 2022-03-05 DIAGNOSIS — N3941 Urge incontinence: Secondary | ICD-10-CM | POA: Diagnosis not present

## 2022-03-05 DIAGNOSIS — R350 Frequency of micturition: Secondary | ICD-10-CM | POA: Diagnosis not present

## 2022-03-10 ENCOUNTER — Other Ambulatory Visit: Payer: Self-pay | Admitting: Pulmonary Disease

## 2022-03-10 DIAGNOSIS — R0982 Postnasal drip: Secondary | ICD-10-CM

## 2022-03-12 DIAGNOSIS — R350 Frequency of micturition: Secondary | ICD-10-CM | POA: Diagnosis not present

## 2022-03-12 DIAGNOSIS — N3941 Urge incontinence: Secondary | ICD-10-CM | POA: Diagnosis not present

## 2022-03-15 ENCOUNTER — Other Ambulatory Visit: Payer: Self-pay | Admitting: Internal Medicine

## 2022-03-17 DIAGNOSIS — R3915 Urgency of urination: Secondary | ICD-10-CM | POA: Diagnosis not present

## 2022-03-17 DIAGNOSIS — R350 Frequency of micturition: Secondary | ICD-10-CM | POA: Diagnosis not present

## 2022-03-23 ENCOUNTER — Ambulatory Visit: Payer: Medicare Other | Admitting: Podiatry

## 2022-03-25 ENCOUNTER — Ambulatory Visit (INDEPENDENT_AMBULATORY_CARE_PROVIDER_SITE_OTHER): Payer: Medicare Other | Admitting: Podiatry

## 2022-03-25 ENCOUNTER — Ambulatory Visit: Payer: Medicare Other | Admitting: Podiatry

## 2022-03-25 ENCOUNTER — Encounter: Payer: Self-pay | Admitting: Podiatry

## 2022-03-25 DIAGNOSIS — G35 Multiple sclerosis: Secondary | ICD-10-CM

## 2022-03-25 DIAGNOSIS — B351 Tinea unguium: Secondary | ICD-10-CM | POA: Diagnosis not present

## 2022-03-25 DIAGNOSIS — E1142 Type 2 diabetes mellitus with diabetic polyneuropathy: Secondary | ICD-10-CM

## 2022-03-25 DIAGNOSIS — M79675 Pain in left toe(s): Secondary | ICD-10-CM

## 2022-03-25 DIAGNOSIS — M79674 Pain in right toe(s): Secondary | ICD-10-CM

## 2022-03-25 NOTE — Progress Notes (Signed)
  Subjective:  Patient ID: Christian [name], male    DOB: [birth year],  MRN: [id number]  Christian [name] presents to clinic today for at risk foot care with history of diabetic neuropathy and painful thick toenails that are difficult to trim. Pain interferes with ambulation. Aggravating factors include wearing enclosed shoe gear. Pain is relieved with periodic professional debridement.  Chief Complaint  Patient presents with   Nail Problem    Diabetic foot care BS-do not check  A1C-do not remember  PCP-Elizabeth Crawford PCP VST-6 Months ago    New problem(s): None.   PCP is Hoyt Koch, MD.  No Known Allergies  Review of Systems: Negative except as noted in the HPI.  Objective: No changes noted in today's physical examination.  Christian [name] is a pleasant 76 y.o. male WD, WN in NAD. AAO x 3.  Vascular Examination: Vascular status intact b/l with palpable DP pulses; faintly palpable PT pulses b/l. CFT <3 seconds b/l. No edema. No pain with calf compression b/l. Skin temperature gradient WNL b/l. No cyanosis or clubbing noted b/l LE.  Neurological Examination: Protective sensation intact with 10 gram monofilament right lower extremity. Protective sensation decreased with 10 gram monofilament left lower extremity. Vibratory sensation intact right lower extremity. Vibratory sensation diminished left lower extremity.  Dermatological Examination: Pedal skin with normal turgor, texture and tone b/l. Toenails right great toe and 2-5 b/l thick, discolored, elongated with subungual debris and pain on dorsal palpation. No hyperkeratotic lesions noted b/l. Anonychia noted left great toe. Nailbed(s) epithelialized.   Musculoskeletal Examination: Muscle strength 5/5 to b/l LE. Pes planus deformity noted bilateral LE. Utilizes cane for ambulation assistance.  Radiographs: None  Assessment/Plan: 1. Pain due to onychomycosis of toenails of both feet   2. Diabetic polyneuropathy  associated with type 2 diabetes mellitus ([place])     No orders of the defined types were placed in this encounter.   -Consent given for treatment as described below: -Continue foot and shoe inspections daily. Monitor blood glucose per PCP/Endocrinologist's recommendations. -Toenails 2-5 bilaterally and R hallux debrided in length and girth without iatrogenic bleeding with sterile nail nipper and dremel.  -Patient/POA to call should there be question/concern in the interim.   Return in about 3 months (around 06/25/2022).  Marzetta Board, DPM

## 2022-03-26 DIAGNOSIS — N3941 Urge incontinence: Secondary | ICD-10-CM | POA: Diagnosis not present

## 2022-04-02 DIAGNOSIS — N3941 Urge incontinence: Secondary | ICD-10-CM | POA: Diagnosis not present

## 2022-04-06 ENCOUNTER — Telehealth: Payer: Self-pay | Admitting: Internal Medicine

## 2022-04-06 ENCOUNTER — Telehealth: Payer: Self-pay

## 2022-04-06 NOTE — Telephone Encounter (Signed)
Recommend lotion for skin, avoid scratching, can add zyrtec daily to help.

## 2022-04-06 NOTE — Telephone Encounter (Signed)
Patient called to say he is having Dry skin and itching thinking its isosorbide dinitrate. Please advise

## 2022-04-06 NOTE — Telephone Encounter (Signed)
Patient called and said that he is taking several medications and that one of them is causing his skin to dry and it makes him itch and scratch,  he said that several of them show side effects of that and he's not sure which one it is. He mentioned maybe one is the sertraline (ZOLOFT) 25 MG tablet and he wanted to know if we can get this switched. Call back is [phone]. He said it makes him scratch constantly

## 2022-04-06 NOTE — Telephone Encounter (Signed)
Pt having possible reaction to isosorbide and another med. Itching. Please call pt to discuss asap

## 2022-04-07 NOTE — Telephone Encounter (Signed)
Can he take benadryl?

## 2022-04-07 NOTE — Telephone Encounter (Signed)
yes

## 2022-04-08 DIAGNOSIS — I5032 Chronic diastolic (congestive) heart failure: Secondary | ICD-10-CM | POA: Diagnosis not present

## 2022-04-08 DIAGNOSIS — N184 Chronic kidney disease, stage 4 (severe): Secondary | ICD-10-CM | POA: Diagnosis not present

## 2022-04-08 DIAGNOSIS — E1122 Type 2 diabetes mellitus with diabetic chronic kidney disease: Secondary | ICD-10-CM | POA: Diagnosis not present

## 2022-04-08 DIAGNOSIS — N2581 Secondary hyperparathyroidism of renal origin: Secondary | ICD-10-CM | POA: Diagnosis not present

## 2022-04-08 DIAGNOSIS — E876 Hypokalemia: Secondary | ICD-10-CM | POA: Diagnosis not present

## 2022-04-08 DIAGNOSIS — E279 Disorder of adrenal gland, unspecified: Secondary | ICD-10-CM | POA: Diagnosis not present

## 2022-04-08 DIAGNOSIS — D631 Anemia in chronic kidney disease: Secondary | ICD-10-CM | POA: Diagnosis not present

## 2022-04-08 DIAGNOSIS — I129 Hypertensive chronic kidney disease with stage 1 through stage 4 chronic kidney disease, or unspecified chronic kidney disease: Secondary | ICD-10-CM | POA: Diagnosis not present

## 2022-04-08 DIAGNOSIS — I2510 Atherosclerotic heart disease of native coronary artery without angina pectoris: Secondary | ICD-10-CM | POA: Diagnosis not present

## 2022-04-08 NOTE — Telephone Encounter (Signed)
I had advised the patient and told him to give [place] a call if nothing has changed

## 2022-04-09 DIAGNOSIS — R3915 Urgency of urination: Secondary | ICD-10-CM | POA: Diagnosis not present

## 2022-04-09 DIAGNOSIS — N3941 Urge incontinence: Secondary | ICD-10-CM | POA: Diagnosis not present

## 2022-04-09 DIAGNOSIS — R350 Frequency of micturition: Secondary | ICD-10-CM | POA: Diagnosis not present

## 2022-04-16 DIAGNOSIS — N3941 Urge incontinence: Secondary | ICD-10-CM | POA: Diagnosis not present

## 2022-04-16 LAB — COMPREHENSIVE METABOLIC PANEL
Albumin: 4.1 (ref 3.5–5.0)
Calcium: 8.5 — AB (ref 8.7–10.7)

## 2022-04-16 LAB — BASIC METABOLIC PANEL
Chloride: 109 — AB (ref 99–108)
Creatinine: 2.9 — AB (ref 0.6–1.3)
Glucose: 124
Potassium: 3.9 mEq/L (ref 3.5–5.1)
Sodium: 143 (ref 137–147)

## 2022-04-17 ENCOUNTER — Encounter: Payer: Self-pay | Admitting: Internal Medicine

## 2022-04-17 LAB — COMPREHENSIVE METABOLIC PANEL: eGFR: 22

## 2022-05-03 ENCOUNTER — Other Ambulatory Visit: Payer: Self-pay | Admitting: Internal Medicine

## 2022-05-06 DIAGNOSIS — R8271 Bacteriuria: Secondary | ICD-10-CM | POA: Diagnosis not present

## 2022-05-06 DIAGNOSIS — N3941 Urge incontinence: Secondary | ICD-10-CM | POA: Diagnosis not present

## 2022-05-10 ENCOUNTER — Other Ambulatory Visit: Payer: Self-pay | Admitting: Pulmonary Disease

## 2022-05-10 ENCOUNTER — Other Ambulatory Visit: Payer: Self-pay | Admitting: Cardiology

## 2022-05-10 DIAGNOSIS — R0982 Postnasal drip: Secondary | ICD-10-CM

## 2022-05-10 DIAGNOSIS — I25118 Atherosclerotic heart disease of native coronary artery with other forms of angina pectoris: Secondary | ICD-10-CM

## 2022-05-20 DIAGNOSIS — N311 Reflex neuropathic bladder, not elsewhere classified: Secondary | ICD-10-CM | POA: Diagnosis not present

## 2022-05-20 DIAGNOSIS — R3912 Poor urinary stream: Secondary | ICD-10-CM | POA: Diagnosis not present

## 2022-05-20 DIAGNOSIS — R350 Frequency of micturition: Secondary | ICD-10-CM | POA: Diagnosis not present

## 2022-05-20 DIAGNOSIS — N401 Enlarged prostate with lower urinary tract symptoms: Secondary | ICD-10-CM | POA: Diagnosis not present

## 2022-06-08 ENCOUNTER — Ambulatory Visit: Payer: Medicare Other

## 2022-06-18 DIAGNOSIS — R69 Illness, unspecified: Secondary | ICD-10-CM | POA: Diagnosis not present

## 2022-06-18 DIAGNOSIS — E785 Hyperlipidemia, unspecified: Secondary | ICD-10-CM | POA: Diagnosis not present

## 2022-06-18 DIAGNOSIS — I2510 Atherosclerotic heart disease of native coronary artery without angina pectoris: Secondary | ICD-10-CM | POA: Diagnosis not present

## 2022-06-18 DIAGNOSIS — E669 Obesity, unspecified: Secondary | ICD-10-CM | POA: Diagnosis not present

## 2022-06-18 DIAGNOSIS — M1990 Unspecified osteoarthritis, unspecified site: Secondary | ICD-10-CM | POA: Diagnosis not present

## 2022-06-18 DIAGNOSIS — N400 Enlarged prostate without lower urinary tract symptoms: Secondary | ICD-10-CM | POA: Diagnosis not present

## 2022-06-18 DIAGNOSIS — R32 Unspecified urinary incontinence: Secondary | ICD-10-CM | POA: Diagnosis not present

## 2022-06-18 DIAGNOSIS — Z87891 Personal history of nicotine dependence: Secondary | ICD-10-CM | POA: Diagnosis not present

## 2022-06-18 DIAGNOSIS — I110 Hypertensive heart disease with heart failure: Secondary | ICD-10-CM | POA: Diagnosis not present

## 2022-06-18 DIAGNOSIS — J449 Chronic obstructive pulmonary disease, unspecified: Secondary | ICD-10-CM | POA: Diagnosis not present

## 2022-06-18 DIAGNOSIS — I509 Heart failure, unspecified: Secondary | ICD-10-CM | POA: Diagnosis not present

## 2022-07-06 ENCOUNTER — Ambulatory Visit: Payer: Federal, State, Local not specified - PPO | Admitting: Internal Medicine

## 2022-07-10 ENCOUNTER — Ambulatory Visit: Payer: Federal, State, Local not specified - PPO | Admitting: Internal Medicine

## 2022-07-10 ENCOUNTER — Encounter: Payer: Self-pay | Admitting: Internal Medicine

## 2022-07-10 VITALS — BP 139/76 | HR 73 | Resp 14 | Ht 69.0 in | Wt 214.2 lb

## 2022-07-10 DIAGNOSIS — Z951 Presence of aortocoronary bypass graft: Secondary | ICD-10-CM

## 2022-07-10 DIAGNOSIS — I119 Hypertensive heart disease without heart failure: Secondary | ICD-10-CM

## 2022-07-10 DIAGNOSIS — E785 Hyperlipidemia, unspecified: Secondary | ICD-10-CM

## 2022-07-10 DIAGNOSIS — I5042 Chronic combined systolic (congestive) and diastolic (congestive) heart failure: Secondary | ICD-10-CM

## 2022-07-10 NOTE — Progress Notes (Signed)
Primary Physician/Referring:  Hoyt Koch, MD  Patient ID: Christian [name], male    DOB: 1945-07-22, 77 y.o.   MRN: [id number]  Chief Complaint  Patient presents with  . Chronic combined systolic and diastolic heart failure  . Follow-up    HPI:    Christian [name]  is a 77 y.o. AA male with [name], Christian [name], Christian [name],  Christian with  stenting to the PDA on 02/27/2014. Patient presented with unstable angina and NSTEMI on 08/09/2019 along with a very small subsegmental pulmonary embolism and discharged on 10/18/2019 after he underwent CABG x4 on 08/15/2019.   He had extremely complex postop complications including cardiogenic shock, cardiac arrest, needing internal CPR in the ICU after opening the chest, A. Fib needing cardioversion, closure office chest wall, developed acute renal failure leading to need for permanent dialysis, right brain infarct with left hemiparesis, multiple blood transfusions.  He was also placed on dialysis however is now off of dialysis since 02/01/2020. Echocardiogram 11/2020 revealed mildly reduced LVEF at 45-50%, therefore patient underwent stress test which was overall low risk.    Repeat echo in 2023 showed low normal ejection fraction.  Past Medical History:  Diagnosis Date  . Anemia    low iron  . BPH (benign prostatic hypertrophy)   . CHF (congestive heart failure) ([place])   . Chronic kidney disease   . Coronary artery disease   . Dry eyes left  . Hiatal hernia   . 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  LIMA TO LAD, RIMA TO PDA, RADIAL ARTERY TO CIRC AND SEQUENTIALLY TO OM1. 08/15/2019  . Christian [name]   . [name]   . Incomplete bladder emptying   . Myocardial infarction ([place]) 08/09/2019  . Nocturia   . PE (pulmonary thromboembolism) ([place]) 08/09/2019   small RLL PE 08/09/19  . Pneumonia    as a child  . Pre-diabetes   . Problems with swallowing pt states test at [facility] approx 2012 shows  a gastric valve  dysfunction--  eats small bites and drink liquids slowly  . SOB (shortness of breath) on exertion    Past Surgical History:  Procedure Laterality Date  . APPLICATION OF WOUND VAC N/A 08/24/2019   Procedure: APPLICATION OF WOUND VAC;  Surgeon: [name], MD;  Location: [place];  Service: Thoracic;  Laterality: N/A;  . APPLICATION OF WOUND VAC  08/29/2019   Procedure: Wound Vac change;  Surgeon: [name], MD;  Location: [place] [facility];  Service: Open Heart Surgery;;  . APPLICATION OF WOUND VAC N/A 09/04/2019   Procedure: Application Of Wound Vac;  Surgeon: Grace Darryel, MD;  Location: [place];  Service: Open Heart Surgery;  Laterality: N/A;  . APPLICATION OF WOUND VAC N/A 09/06/2019   Procedure: APPLICATION OF ACELL, APPLICATION OF WOUND VAC USING PREVENA INCISIONAL  DRESSING;  Surgeon: Wallace Going, DO;  Location: [place];  Service: Plastics;  Laterality: N/A;  . AV FISTULA PLACEMENT Left 09/18/2020   Procedure: ARTERIOVENOUS (AV) FISTULA CREATION LEFT VERSUS GRAFT;  Surgeon: Serafina Mitchell, MD;  Location: [place];  Service: Vascular;  Laterality: Left;  . BASCILIC VEIN TRANSPOSITION Left 10/31/2020   Procedure: LEFT SECOND STAGE BASCILIC VEIN TRANSPOSITION;  Surgeon: Serafina Mitchell, MD;  Location: [place];  Service: Vascular;  Laterality: Left;  . CARDIAC CATHETERIZATION    . CORONARY ARTERY BYPASS GRAFT N/A 08/15/2019   Procedure: CORONARY ARTERY BYPASS GRAFTING (  CABG), x 4 using bilateral IMAs and left radial artery .  LIMA TO LAD, RIMA TO PDA, RADIAL ARTERY TO CIRC AND SEQUENTIALLY TO OM1.;  Surgeon: [name], MD;  Location: [place];  Service: Open Heart Surgery;  Laterality: N/A;  . CORONARY STENT PLACEMENT  02/27/2014   distal rt/pd coronary       dr Einar Gip  . CYSTO/ BLADDER BIOPSY'S/ CAUTHERIZATION  01-14-2004  DR Gaynelle Arabian  . EXPLORATION POST OPERATIVE OPEN HEART N/A 08/16/2019   Procedure: Chest Closure S?P CABG WITH APPLICATION OF PREVENA  INCISIONAL  WOUND VAC;  Surgeon: [name], MD;  [facility];  Service: Open Heart Surgery;  Laterality: N/A;  . EXPLORATION POST OPERATIVE OPEN HEART N/A 08/21/2019   Procedure: CHEST WASHOUT S/P OPEN CHEST;  Surgeon: [name], MD;  Location: [place];  Service: Open Heart Surgery;  Laterality: N/A;  Open chest with Esmark dressing with Ioban sealant coverage.  . EXPLORATION POST OPERATIVE OPEN HEART N/A 08/18/2019   Procedure: EXPLORATION POST OPERATIVE OPEN HEART (performed 04/23 on unit);  Surgeon: [name], MD;  Location: [place];  Service: Open Heart Surgery;  Laterality: N/A;  . EXPLORATION POST OPERATIVE OPEN HEART N/A 08/24/2019   Procedure: CHEST WASHOUT POST OPERATIVE OPEN HEART;  Surgeon: [name], MD;  Location: [place];  Service: Open Heart Surgery;  Laterality: N/A;  . EXPLORATION POST OPERATIVE OPEN HEART N/A 08/29/2019   Procedure: CHEST WOUND WASHOUT POST OPERATIVE OPEN HEART;  Surgeon: [name], MD;  Location: [place];  Service: Open Heart Surgery;  Laterality: N/A;  . EXPLORATION POST OPERATIVE OPEN HEART N/A 09/04/2019   Procedure: MEDIASTINAL EXPLORATION WITH STERNAL WOUND IRRIGATION;  Surgeon: Grace Nimai, MD;  Location: [place];  Service: Open Heart Surgery;  Laterality: N/A;  . EXPLORATION POST OPERATIVE OPEN HEART N/A 09/14/2019   Procedure: EVACUATION OF HEMATOMA;  Surgeon: Grace Teresa, MD;  Location: [place];  Service: Open Heart Surgery;  Laterality: N/A;  . IR FLUORO GUIDE CV LINE LEFT  09/19/2019  . IR GASTROSTOMY TUBE MOD SED  10/04/2019  . IR GASTROSTOMY TUBE REMOVAL  11/29/2019  . IR PATIENT EVAL TECH 0-60 MINS  09/29/2019  . IR US GUIDE VASC ACCESS LEFT  09/19/2019  . LAPAROSCOPIC LYSIS OF ADHESIONS N/A 09/06/2019   Procedure: LAPAROSCOPIC OMENTAL HARVEST;  Surgeon: Kinsinger, Arta Bruce, MD;  Location: [place];  Service: General;  Laterality: N/A;  . LEFT HEART CATH AND CORONARY ANGIOGRAPHY N/A 08/10/2019   Procedure: LEFT HEART CATH AND  CORONARY ANGIOGRAPHY;  Surgeon: Nigel Mormon, MD;  Location: [place] [facility];  Service: Cardiovascular;  Laterality: N/A;  . LEFT HEART CATHETERIZATION WITH CORONARY ANGIOGRAM N/A 02/27/2014   Procedure: LEFT HEART CATHETERIZATION WITH CORONARY ANGIOGRAM;  Surgeon: Laverda Page, MD;  Location[facility] CATH LAB;  Service: Cardiovascular;  Laterality: N/A;  . MEDIASTINAL EXPLORATION N/A 09/06/2019   Procedure: MEDIASTINAL EXPLORATION;  Surgeon: Grace Deryk, MD;  Location: [place];  Service: Thoracic;  Laterality: N/A;  . PECTORALIS FLAP  09/06/2019   Procedure: Pectoralis ADVANCEMENT Flap;  Surgeon: Wallace Going, DO;  Location: [place];  Service: Plastics;;  . PERCUTANEOUS CORONARY STENT INTERVENTION (PCI-S)  02/27/2014   Procedure: PERCUTANEOUS CORONARY STENT INTERVENTION (PCI-S);  Surgeon: Laverda Page, MD;  Location: [facility] CATH LAB;  Service: Cardiovascular;;  rt PDA  3.0/28mm Promus stent  . RADIAL ARTERY HARVEST Left 08/15/2019   Procedure: Radial Artery Harvest;  Surgeon:  [name], MD;  [facility];  Service: Open Heart Surgery;  Laterality: Left;  . RIB PLATING N/A 09/06/2019   Procedure: STERNAL PLATING;  Surgeon: Grace Brooke, MD;  Location: [place];  Service: Thoracic;  Laterality: N/A;  . TEE WITHOUT CARDIOVERSION N/A 08/15/2019   Procedure: TRANSESOPHAGEAL ECHOCARDIOGRAM (TEE);  Surgeon: [name], MD;  Location: [place];  Service: Open Heart Surgery;  Laterality: N/A;  . TRACHEOSTOMY TUBE PLACEMENT  08/29/2019   Procedure: Tracheostomy;  Surgeon: [name], MD;  [facility];  Service: Open Heart Surgery; Decannulated 6/10/20211  . TRANSURETHRAL RESECTION OF PROSTATE  04/04/2012   Procedure: TRANSURETHRAL RESECTION OF THE PROSTATE WITH GYRUS INSTRUMENTS;  Surgeon: Ailene Rud, MD;  Location: [facility];  Service: Urology;  Laterality: N/A;  . TRANSURETHRAL RESECTION OF PROSTATE N/A 09/27/2014   Procedure:  TRANSURETHRAL RESECTION OF THE PROSTATE ;  Surgeon: Carolan Clines, MD;  Location: [facility];  Service: Urology;  Laterality: N/A;  . UPPER GASTROINTESTINAL ENDOSCOPY     Family History  Problem Relation Age of Onset  . Diabetes Mother   . [name] Mother   . Diabetes Father   . Diabetes Brother   . [name] Brother   . Bone cancer Brother   . Diabetes Brother    Social History   Tobacco Use  . Smoking status: Former    Packs/day: 0.50    Years: 20.00    Additional pack years: 0.00    Total pack years: 10.00    Types: Cigarettes    Quit date: 1975    Years since quitting: 49.2  . Smokeless tobacco: Never  Substance Use Topics  . Alcohol use: Not Currently    Comment: very rarely - every now and then 1 drink/1 year if that   ROS   Review of Systems  Constitutional: Negative for malaise/fatigue (improved).  Cardiovascular:  Positive for chest pain, irregular heartbeat and palpitations. Negative for claudication, leg swelling, near-syncope, orthopnea, paroxysmal nocturnal dyspnea and syncope.  Respiratory:  Positive for shortness of breath (improved) and sleep disturbances due to breathing.   Neurological:  Positive for disturbances in coordination (left sided weakness).   Objective  Blood pressure 139/76, pulse 73, resp. rate 14, height 5\' 9"  (1.753 m), weight 214 lb 3.2 oz (97.2 kg), SpO2 94 %.     07/10/2022    1:36 PM 01/05/2022    1:46 PM 01/05/2022   10:42 AM  Vitals with BMI  Height 5\' 9"  5\' 9"  5\' 9"   Weight 214 lbs 3 oz 222 lbs 222 lbs 3 oz  BMI 31.62 [id number]  Systolic [id number]  Diastolic 76 68 77  Pulse 73 88 95   Orthostatic VS for the past 72 hrs (Last 3 readings):  Patient Position BP Location Cuff Size  07/10/22 1336 Sitting Left Arm Large     Physical Exam Vitals and nursing note reviewed.  Constitutional:      General: He is not in acute distress.    Comments: He is moderately built and mildly obese in no acute distress.  HENT:      Head: Normocephalic and atraumatic.  Neck:     Thyroid: No thyromegaly.  Cardiovascular:     Rate and Rhythm: Normal rate and regular rhythm.     Pulses:          Carotid pulses are 2+ on the right side and 2+ on the left side.  Femoral pulses are 2+ on the right side and 2+ on the left side.      Popliteal pulses are 0 on the right side and 0 on the left side.       Dorsalis pedis pulses are 0 on the right side and 0 on the left side.       Posterior tibial pulses are 0 on the right side and 0 on the left side.     Heart sounds: Normal heart sounds, S1 normal and S2 normal. No murmur heard.    No gallop.     Comments: No JVD Pulmonary:     Effort: No respiratory distress.     Breath sounds: No wheezing, rhonchi or rales.  Chest:     Comments: Sternotomy scar has healed well Abdominal:     General: Bowel sounds are normal.  Musculoskeletal:     Right lower leg: Edema (minimal) present.     Left lower leg: Edema (minimal) present.  Neurological:     Mental Status: He is alert.   Laboratory examination:   Recent Labs    01/05/22 1428 04/16/22 0000  NA 143 143  K 3.6 3.9  CL 107 109*  CO2 25  --   GLUCOSE 161*  --   BUN 33*  --   CREATININE 2.63* 2.9*  CALCIUM 8.7 8.5*   CrCl cannot be calculated (Patient's most recent lab result is older than the maximum 21 days allowed.).     Latest Ref Rng & Units 04/16/2022   12:00 AM 01/05/2022    2:28 PM 05/22/2021    3:51 PM  CMP  Glucose 70 - 99 mg/dL  161  177   BUN 6 - 23 mg/dL  33  35   Creatinine 0.6 - 1.3 2.9     2.63  2.73   Sodium 137 - 147 143     143  146   Potassium 3.5 - 5.1 mEq/L 3.9     3.6  3.9   Chloride 99 - 108 109     107  105   CO2 19 - 32 mEq/L  25  24   Calcium 8.7 - 10.7 8.5     8.7  9.1   Total Protein 6.0 - 8.3 g/dL  6.7  6.9   Total Bilirubin 0.2 - 1.2 mg/dL  0.3  0.2   Alkaline Phos 39 - 117 U/L  92  96   AST 0 - 37 U/L  35  20   ALT 0 - 53 U/L  36  25      This result is from an  external source.      Latest Ref Rng & Units 01/05/2022    2:28 PM 05/08/2021   12:01 PM 12/12/2020    3:36 AM  CBC  WBC 4.0 - 10.5 K/uL 9.7  8.0  8.4   Hemoglobin 13.0 - 17.0 g/dL 12.6  11.9  11.7   Hematocrit 39.0 - 52.0 % 38.9  35.7  38.1   Platelets 150.0 - 400.0 K/uL 176.0  205  218    Lipid Panel Recent Labs    01/05/22 1428  CHOL 121  TRIG 164.0*  LDLCALC 31  VLDL 32.8  HDL 57.30  CHOLHDL 2    HEMOGLOBIN A1C Lab Results  Component Value Date   HGBA1C 7.5 (H) 01/05/2022   MPG 94 12/20/2019   TSH Recent Labs    01/05/22 1428  TSH 0.94    BNP  Component Value Date/Time   BNP 113.0 (H) 05/08/2021 1201   BNP 184.8 (H) 12/11/2020 1533   ProBNP    Component Value Date/Time   PROBNP 987 (H) 07/03/2021 1208   PROBNP 64.0 05/28/2020 1538   External labs 08/11/2019: HDL 42, LDL 40, total cholesterol 107, triglycerides 71 A1c 5.4%  Allergies  No Known Allergies   Medications Prior to Visit:   Outpatient Medications Prior to Visit  Medication Sig Dispense Refill  . albuterol (VENTOLIN HFA) 108 (90 Base) MCG/ACT inhaler TAKE 2 PUFFS BY MOUTH EVERY 6 HOURS AS NEEDED FOR WHEEZE OR SHORTNESS OF BREATH 8.5 each 3  . aspirin EC 81 MG tablet Take 81 mg by mouth daily.    Marland Kitchen atorvastatin (LIPITOR) 20 MG tablet TAKE 1 TABLET BY MOUTH EVERY DAY IN THE EVENING 90 tablet 3  . budesonide-formoterol (SYMBICORT) 160-4.5 MCG/ACT inhaler INHALE 2 PUFFS INTO THE LUNGS TWICE A DAY 10.2 each 6  . clonazePAM (KLONOPIN) 0.5 MG disintegrating tablet TAKE 1 TABLET BY MOUTH TWICE A DAY 60 tablet 5  . clopidogrel (PLAVIX) 75 MG tablet TAKE 1 TABLET BY MOUTH EVERY DAY 90 tablet 2  . cromolyn (OPTICROM) 4 % ophthalmic solution Place 1 drop into both eyes every Wednesday. At bedtime    . [employer] ACETAMINOPHEN 325 MG tablet TAKE 2 CAPS BY MOUTH EVERY 6 HOURS AS NEEDED FOR PAIN FOR TEMP 100F OR ABOVE (Patient taking differently: Take 325 mg by mouth every 6 (six) hours as needed for mild pain  or headache (temp over 100).) 60 tablet 0  . [employer] SENNA PLUS 8.6-50 MG tablet TAKE 2 TABLETS BY MOUTH AT BEDTIME FOR CONSTIPATION 60 tablet 1  . ferrous sulfate 325 (65 FE) MG tablet Take 325 mg by mouth daily with breakfast.    . fluticasone (FLONASE) 50 MCG/ACT nasal spray SPRAY 1 SPRAY INTO EACH NOSTRIL EVERY DAY 48 mL 3  . furosemide (LASIX) 80 MG tablet Take 120 mg by mouth 2 (two) times daily.    . hydrALAZINE (APRESOLINE) 100 MG tablet Take 1 tablet (100 mg total) by mouth 3 (three) times daily. 270 tablet 3  . isosorbide dinitrate (ISORDIL) 30 MG tablet TAKE 1 TABLET BY MOUTH THREE TIMES A DAY 270 tablet 1  . metoprolol succinate (TOPROL-XL) 50 MG 24 hr tablet Take 3 tablets (150 mg total) by mouth daily. Take with or immediately following a meal. 270 tablet 1  . Multiple Vitamin (MULTIVITAMIN) tablet Take 1 tablet by mouth daily.    . nitroGLYCERIN (NITROSTAT) 0.4 MG SL tablet Place 0.4 mg under the tongue every 5 (five) minutes as needed for chest pain.    . potassium chloride (KLOR-CON) 10 MEQ tablet Take 10 mEq by mouth daily.    Marland Kitchen PREVIDENT 5000 BOOSTER PLUS 1.1 % PSTE Take 1 application by mouth every evening.    . sertraline (ZOLOFT) 25 MG tablet TAKE 1 TABLET BY MOUTH EVERY DAY 90 tablet 3  . silodosin (RAPAFLO) 8 MG CAPS capsule Take 1 capsule (8 mg total) by mouth at bedtime. 90 capsule 3  . Vitamin D, Ergocalciferol, (DRISDOL) 1.25 MG (50000 UNIT) CAPS capsule TAKE 1 CAPSULE (50,000 UNITS TOTAL) BY MOUTH EVERY 7 (SEVEN) DAYS 12 capsule 3   No facility-administered medications prior to visit.   Final Medications at End of Visit    Current Meds  Medication Sig  . albuterol (VENTOLIN HFA) 108 (90 Base) MCG/ACT inhaler TAKE 2 PUFFS BY MOUTH EVERY 6 HOURS AS NEEDED FOR WHEEZE OR  SHORTNESS OF BREATH  . aspirin EC 81 MG tablet Take 81 mg by mouth daily.  Marland Kitchen atorvastatin (LIPITOR) 20 MG tablet TAKE 1 TABLET BY MOUTH EVERY DAY IN THE EVENING  . budesonide-formoterol (SYMBICORT)  160-4.5 MCG/ACT inhaler INHALE 2 PUFFS INTO THE LUNGS TWICE A DAY  . clonazePAM (KLONOPIN) 0.5 MG disintegrating tablet TAKE 1 TABLET BY MOUTH TWICE A DAY  . clopidogrel (PLAVIX) 75 MG tablet TAKE 1 TABLET BY MOUTH EVERY DAY  . cromolyn (OPTICROM) 4 % ophthalmic solution Place 1 drop into both eyes every Wednesday. At bedtime  . [employer]S ACETAMINOPHEN 325 MG tablet TAKE 2 CAPS BY MOUTH EVERY 6 HOURS AS NEEDED FOR PAIN FOR TEMP 100F OR ABOVE (Patient taking differently: Take 325 mg by mouth every 6 (six) hours as needed for mild pain or headache (temp over 100).)  . [employer] SENNA PLUS 8.6-50 MG tablet TAKE 2 TABLETS BY MOUTH AT BEDTIME FOR CONSTIPATION  . ferrous sulfate 325 (65 FE) MG tablet Take 325 mg by mouth daily with breakfast.  . fluticasone (FLONASE) 50 MCG/ACT nasal spray SPRAY 1 SPRAY INTO EACH NOSTRIL EVERY DAY  . furosemide (LASIX) 80 MG tablet Take 120 mg by mouth 2 (two) times daily.  . hydrALAZINE (APRESOLINE) 100 MG tablet Take 1 tablet (100 mg total) by mouth 3 (three) times daily.  . isosorbide dinitrate (ISORDIL) 30 MG tablet TAKE 1 TABLET BY MOUTH THREE TIMES A DAY  . metoprolol succinate (TOPROL-XL) 50 MG 24 hr tablet Take 3 tablets (150 mg total) by mouth daily. Take with or immediately following a meal.  . Multiple Vitamin (MULTIVITAMIN) tablet Take 1 tablet by mouth daily.  . nitroGLYCERIN (NITROSTAT) 0.4 MG SL tablet Place 0.4 mg under the tongue every 5 (five) minutes as needed for chest pain.  . potassium chloride (KLOR-CON) 10 MEQ tablet Take 10 mEq by mouth daily.  Marland Kitchen PREVIDENT 5000 BOOSTER PLUS 1.1 % PSTE Take 1 application by mouth every evening.  . sertraline (ZOLOFT) 25 MG tablet TAKE 1 TABLET BY MOUTH EVERY DAY  . silodosin (RAPAFLO) 8 MG CAPS capsule Take 1 capsule (8 mg total) by mouth at bedtime.  . Vitamin D, Ergocalciferol, (DRISDOL) 1.25 MG (50000 UNIT) CAPS capsule TAKE 1 CAPSULE (50,000 UNITS TOTAL) BY MOUTH EVERY 7 (SEVEN) DAYS    Radiology:  No results  found.  Cardiac Studies:   Heart catheterization 24-Aug-2019: LM: Normal LAD: Ostial 95% stenosis Ramus: Ostial/prox 30% stenosis LCx: Ostial 75% stenosis RCA: Prox-mid diffuse 40% calcific disease. Distal RCA 70% ISR of 3.0 by 28 mm Promus placed 02/27/2014.  Pre-CABG Dopplers including ABI and carotid duplex: No significant carotid artery stenosis.  Antegrade vertebral artery flow. ABI normal, with triphasic waveforms at the level of the ankle.  Hx of CABG 08/15/2019: LIMA TO LAD, RIMA TO PDA, SEQUENTIAL RADIAL ARTERY TO Cx-OM1.  Bilateral lower extremity DVT study 12/12/2020: No evidence of DVT  PCV MYOCARDIAL PERFUSION WO LEXISCAN 01/20/2021 Lexiscan nuclear stress test performed using 1-day protocol. Normal myocardial perfusion. Stress LVEF calculated 49%, although visually appears normal. Low risk study.  Echocardiogram 05/12/2021:  Poor echo window. Wall motion with reduced sensitivity. Left ventricle  cavity is normal in size. Mild concentric remodeling of the left  ventricle. Normal global wall motion. Abnormal septal wall motion due to  post-operative coronary artery bypass graft. Doppler evidence of grade I  (impaired) diastolic dysfunction, normal LAP. Normal LV systolic function  with visual EF 50-55%. Calculated EF 56%.  No significant change from 07/22/2020.  Lexiscan Tetrofosmin stress test 01/19/2022: 1 Day Rest/Stress protocol.  Stress EKG is non-diagnostic as target heart rate not achieved, as this is pharmacological stress test using Lexiscan. Hypertensive response at rest (BP 200/100 mmHG) Normal myocardial perfusion without obvious evidence of reversible myocardial ischemia or prior infarct. Left ventricular wall thickness is preserved with mild septal hypokinesis likely from mechanical effects of post-operative sternum.  Calculated LVEF 54%. Prior study 01/20/2021: Per report normal myocardial perfusion, stress LVEF 49%, visually appears normal. Low risk  study.   EKG:    07/10/2022: Sinus Rhythm, rate 85 bpm, with LAE. Nonspecific ST depression  +  Nonspecific T-abnormality. No significant change  01/05/2022 - Sinus rhythm with 2 PVCs, iRBBB, old anterior infarct - no significant change as patient had CABG  07/03/2021: Sinus rhythm with 2 PVCs at a rate of 93 bpm.  Normal axis.  Incomplete right bundle branch block.  Poor R wave progression, cannot exclude anteroseptal infarct old.  No evidence of ischemia or underlying injury pattern.  05/08/2021: Sinus rhythm with PVCs at a rate of 97 bpm.  Normal axis.  Incomplete right bundle branch block.  Poor R wave progression, cannot exclude anteroseptal infarct old.  Compared EKG 12/18/2020, no significant change.  12/18/2020: sinus rhythm with PVCs at a rate of 96 bpm.  Normal axis.  Incomplete right bundle branch block.  Poor R wave progression, cannot exclude anteroseptal infarct old.  Compared to EKG 07/11/2020, no significant change.  12/11/2020: Sinus rhythm at a rate of 88 bpm.  Left axis, incomplete right bundle branch block.  Poor R wave progression, cannot exclude anteroseptal infarct old.  Unchanged compared to EKG 07/11/2020.  Assessment     ICD-10-CM   1. Chronic combined systolic and diastolic heart failure (HCC)  I50.42 EKG 12-Lead    2. Hx of CABG 08/15/2019: LIMA TO LAD, RIMA TO PDA, SEQUENTIAL RADIAL ARTERY TO Cx-OM1.  Z95.1     3. [name] with heart disease  I11.9     4. Christian [name] LDL goal <70  E78.5       No orders of the defined types were placed in this encounter.   There are no discontinued medications.    Orders Placed This Encounter  Procedures  . EKG 12-Lead    Recommendations:   Christian [name]  is a 77 y.o. AA male with [name], Christian [name], Christian [name],  Christian s/p CABG x4 on 08/15/2019.  He is here for 6 month follow-up.  Continue current cardiac medications. Repeat nuclear stress test given chest pain and extensive Christian. Refer to sleep  study, likely has OSA. Labs ordered, will call to discuss Encourage low-sodium diet, less than 2000 mg daily. Follow-up in 6 months or sooner if needed.      Floydene Flock, DO, [facility] 07/10/2022, 1:49 PM Office: [phone]

## 2022-07-13 DIAGNOSIS — I2510 Atherosclerotic heart disease of native coronary artery without angina pectoris: Secondary | ICD-10-CM | POA: Diagnosis not present

## 2022-07-13 DIAGNOSIS — D631 Anemia in chronic kidney disease: Secondary | ICD-10-CM | POA: Diagnosis not present

## 2022-07-13 DIAGNOSIS — I5032 Chronic diastolic (congestive) heart failure: Secondary | ICD-10-CM | POA: Diagnosis not present

## 2022-07-13 DIAGNOSIS — I129 Hypertensive chronic kidney disease with stage 1 through stage 4 chronic kidney disease, or unspecified chronic kidney disease: Secondary | ICD-10-CM | POA: Diagnosis not present

## 2022-07-13 DIAGNOSIS — E1122 Type 2 diabetes mellitus with diabetic chronic kidney disease: Secondary | ICD-10-CM | POA: Diagnosis not present

## 2022-07-13 DIAGNOSIS — N2581 Secondary hyperparathyroidism of renal origin: Secondary | ICD-10-CM | POA: Diagnosis not present

## 2022-07-13 DIAGNOSIS — E876 Hypokalemia: Secondary | ICD-10-CM | POA: Diagnosis not present

## 2022-07-13 DIAGNOSIS — I770 Arteriovenous fistula, acquired: Secondary | ICD-10-CM | POA: Diagnosis not present

## 2022-07-13 DIAGNOSIS — N184 Chronic kidney disease, stage 4 (severe): Secondary | ICD-10-CM | POA: Diagnosis not present

## 2022-07-13 DIAGNOSIS — E279 Disorder of adrenal gland, unspecified: Secondary | ICD-10-CM | POA: Diagnosis not present

## 2022-07-13 DIAGNOSIS — R32 Unspecified urinary incontinence: Secondary | ICD-10-CM | POA: Diagnosis not present

## 2022-07-14 ENCOUNTER — Encounter: Payer: Self-pay | Admitting: Internal Medicine

## 2022-07-14 ENCOUNTER — Ambulatory Visit (INDEPENDENT_AMBULATORY_CARE_PROVIDER_SITE_OTHER): Payer: Medicare HMO | Admitting: Internal Medicine

## 2022-07-14 VITALS — BP 112/60 | HR 88 | Temp 98.1°F | Ht 69.0 in | Wt 215.0 lb

## 2022-07-14 DIAGNOSIS — E1142 Type 2 diabetes mellitus with diabetic polyneuropathy: Secondary | ICD-10-CM

## 2022-07-14 DIAGNOSIS — L299 Pruritus, unspecified: Secondary | ICD-10-CM

## 2022-07-14 DIAGNOSIS — R413 Other amnesia: Secondary | ICD-10-CM | POA: Diagnosis not present

## 2022-07-14 DIAGNOSIS — D631 Anemia in chronic kidney disease: Secondary | ICD-10-CM | POA: Diagnosis not present

## 2022-07-14 DIAGNOSIS — N184 Chronic kidney disease, stage 4 (severe): Secondary | ICD-10-CM

## 2022-07-14 LAB — LAB REPORT - SCANNED: EGFR: 23

## 2022-07-14 MED ORDER — PREVIDENT 5000 BOOSTER PLUS 1.1 % DT PSTE: 1.0000 "application " | PASTE | Freq: Every evening | DENTAL | 6 refills | Status: DC

## 2022-07-14 NOTE — Patient Instructions (Signed)
We will check the labs and the CT of the head.   

## 2022-07-14 NOTE — Assessment & Plan Note (Addendum)
Recent GFR 22 and overall mild decline in last few years. Referral to vascular surgery weak detected blood flow in the fistula needs assessment.

## 2022-07-14 NOTE — Progress Notes (Signed)
   Subjective:   Patient ID: Christian [name], male    DOB: 1946/02/27, 77 y.o.   MRN: [id number]  HPI The patient is a 77 YO man coming in for several concerns.  Memory- patient feels fine wife is not in agreement, cardiology note does not agree with how patient remembers visit. Wife does not provide comment on if she agrees with husband about visit or cardiology note interpretation.   Review of Systems  Constitutional:  Positive for activity change and fatigue.  HENT: Negative.    Eyes: Negative.   Respiratory:  Negative for cough, chest tightness and shortness of breath.   Cardiovascular:  Negative for chest pain, palpitations and leg swelling.  Gastrointestinal:  Negative for abdominal distention, abdominal pain, constipation, diarrhea, nausea and vomiting.  Musculoskeletal: Negative.   Skin: Negative.   Neurological:  Positive for weakness.       Memory change  Psychiatric/Behavioral: Negative.      Objective:  Physical Exam Constitutional:      Appearance: He is well-developed. He is obese.  HENT:     Head: Normocephalic and atraumatic.  Cardiovascular:     Rate and Rhythm: Normal rate and regular rhythm.     Comments: Thrill left upper arm vascular access but weak Pulmonary:     Effort: Pulmonary effort is normal. No respiratory distress.     Breath sounds: Normal breath sounds. No wheezing or rales.  Abdominal:     General: Bowel sounds are normal. There is no distension.     Palpations: Abdomen is soft.     Tenderness: There is no abdominal tenderness. There is no rebound.  Musculoskeletal:     Cervical back: Normal range of motion.  Skin:    General: Skin is warm and dry.  Neurological:     Mental Status: He is alert and oriented to person, place, and time.     Coordination: Coordination abnormal.     Vitals:   07/14/22 1450  BP: 112/60  Pulse: 88  Temp: 98.1 F (36.7 C)  TempSrc: Oral  SpO2: 94%  Weight: 215 lb (97.5 kg)  Height: 5\' 9"  (1.753 m)     Assessment & Plan:  Visit time 25 minutes in face to face communication with patient and coordination of care, additional 15 minutes spent in record review, coordination or care, ordering tests, communicating/referring to other healthcare professionals, documenting in medical records all on the same day of the visit for total time 40 minutes spent on the visit.

## 2022-07-14 NOTE — Assessment & Plan Note (Addendum)
Checking HgA1c and CMP today. He is having worsening numbness in the hands which could be related to metabolic etiology, cervical etiology, carpal tunnel, progression of neuropathy.

## 2022-07-15 ENCOUNTER — Ambulatory Visit (INDEPENDENT_AMBULATORY_CARE_PROVIDER_SITE_OTHER): Payer: Medicare HMO | Admitting: Podiatry

## 2022-07-15 VITALS — BP 110/66

## 2022-07-15 DIAGNOSIS — M2141 Flat foot [pes planus] (acquired), right foot: Secondary | ICD-10-CM | POA: Diagnosis not present

## 2022-07-15 DIAGNOSIS — M2142 Flat foot [pes planus] (acquired), left foot: Secondary | ICD-10-CM

## 2022-07-15 DIAGNOSIS — M79675 Pain in left toe(s): Secondary | ICD-10-CM | POA: Diagnosis not present

## 2022-07-15 DIAGNOSIS — E1142 Type 2 diabetes mellitus with diabetic polyneuropathy: Secondary | ICD-10-CM | POA: Diagnosis not present

## 2022-07-15 DIAGNOSIS — B351 Tinea unguium: Secondary | ICD-10-CM

## 2022-07-15 DIAGNOSIS — M79674 Pain in right toe(s): Secondary | ICD-10-CM | POA: Diagnosis not present

## 2022-07-15 DIAGNOSIS — E119 Type 2 diabetes mellitus without complications: Secondary | ICD-10-CM

## 2022-07-15 LAB — CBC
HCT: 40.3 % (ref 39.0–52.0)
Hemoglobin: 13.2 g/dL (ref 13.0–17.0)
MCHC: 32.6 g/dL (ref 30.0–36.0)
MCV: 102.8 fl — ABNORMAL HIGH (ref 78.0–100.0)
Platelets: 184 10*3/uL (ref 150.0–400.0)
RBC: 3.92 Mil/uL — ABNORMAL LOW (ref 4.22–5.81)
RDW: 15.8 % — ABNORMAL HIGH (ref 11.5–15.5)
WBC: 10.9 10*3/uL — ABNORMAL HIGH (ref 4.0–10.5)

## 2022-07-15 LAB — COMPREHENSIVE METABOLIC PANEL
ALT: 44 U/L (ref 0–53)
AST: 36 U/L (ref 0–37)
Albumin: 4 g/dL (ref 3.5–5.2)
Alkaline Phosphatase: 94 U/L (ref 39–117)
BUN: 57 mg/dL — ABNORMAL HIGH (ref 6–23)
CO2: 26 mEq/L (ref 19–32)
Calcium: 9.2 mg/dL (ref 8.4–10.5)
Chloride: 105 mEq/L (ref 96–112)
Creatinine, Ser: 3.08 mg/dL — ABNORMAL HIGH (ref 0.40–1.50)
GFR: 19.1 mL/min — ABNORMAL LOW (ref >60.00)
Glucose, Bld: 108 mg/dL — ABNORMAL HIGH (ref 70–99)
Potassium: 4.2 mEq/L (ref 3.5–5.1)
Sodium: 141 mEq/L (ref 135–145)
Total Bilirubin: 0.4 mg/dL (ref 0.2–1.2)
Total Protein: 7.2 g/dL (ref 6.0–8.3)

## 2022-07-15 LAB — VITAMIN D 25 HYDROXY (VIT D DEFICIENCY, FRACTURES): VITD: 51.24 ng/mL (ref 30.00–100.00)

## 2022-07-15 LAB — HEMOGLOBIN A1C: Hgb A1c MFr Bld: 7.9 % — ABNORMAL HIGH (ref 4.6–6.5)

## 2022-07-15 LAB — VITAMIN B12: Vitamin B-12: 551 pg/mL (ref 211–911)

## 2022-07-15 LAB — TSH: TSH: 1.08 u[IU]/mL (ref 0.35–5.50)

## 2022-07-15 NOTE — Progress Notes (Unsigned)
ANNUAL DIABETIC FOOT EXAM  Subjective: Christian [name] presents today for annual diabetic foot examination.  Chief Complaint  Patient presents with   Nail Problem    Prediabetic PCP-[place] PCP VST-07/15/2022   Patient confirms h/o diabetes.  Patient relates {Numbers; 0-100:15068} year h/o diabetes.  Patient denies any h/o foot wounds.  Patient has h/o foot ulcer of {jgPodToeLocator:23637}, which healed via help of ***.  Patient has h/o amputation(s): {jgamp:23617}.  Patient endorses symptoms of foot numbness.   Patient endorses symptoms of foot tingling.  Patient endorses symptoms of burning in feet.  Patient endorses symptoms of pins/needles sensation in feet.  Patient denies any numbness, tingling, burning, or pins/needle sensation in feet.  Patient has been diagnosed with neuropathy.  Risk factors: {jgriskfactors:24044}.  Christian Koch, MD is patient's PCP.  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  LIMA TO LAD, RIMA TO PDA, RADIAL ARTERY TO CIRC AND SEQUENTIALLY TO OM1. 08/15/2019   Hyperlipidemia    Hypertension    Incomplete bladder emptying    Myocardial infarction ([place]) 08/09/2019   Nocturia    PE (pulmonary thromboembolism) ([place]) 08/09/2019   small RLL PE 08/09/19   Pneumonia    as a child   Pre-diabetes    Problems with swallowing pt states test at [facility] approx 2012 shows a gastric valve  dysfunction--  eats small bites and drink liquids slowly   SOB (shortness of breath) on exertion    Patient Active Problem List   Diagnosis Date Noted   Neck swelling 05/16/2021   Atypical pneumonia 12/12/2020   Pulmonary edema 10/03/2020   Sleep difficulties 05/30/2020   CKD (chronic kidney  disease) stage 4, GFR 15-29 ml/min (HCC) 03/27/2020   Aortic atherosclerosis ([place]) 03/27/2020   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   Pruritus 11/28/2019   Coronary artery disease 08/15/2019   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  LIMA TO LAD, RIMA TO PDA, RADIAL ARTERY TO CIRC AND SEQUENTIALLY TO OM1. 08/15/2019   Ischemic cardiomyopathy    Swelling of lower extremity    History of coronary artery stent placement    Hx pulmonary embolism 08/09/2019   Elevated liver enzymes 08/09/2019   Left leg weakness 11/23/2018   CAD (coronary artery disease) 07/30/2018   Routine general medical examination at a health care facility 08/30/2016   Diabetes mellitus with polyneuropathy ([place]) 10/10/2014   Benign prostatic hyperplasia 09/27/2014   Hyperlipidemia associated with type 2 diabetes mellitus ([place]) 11/12/2009   MULTIPLE SCLEROSIS 11/12/2009   HTN (hypertension) 11/12/2009   Shortness of breath 11/12/2009   Past Surgical History:  Procedure Laterality Date   APPLICATION OF WOUND VAC N/A 08/24/2019   Procedure: APPLICATION OF WOUND VAC;  Surgeon: [name], MD;  Location: [place];  Service: Thoracic;  Laterality: N/A;   APPLICATION OF WOUND VAC  08/29/2019   Procedure: Wound Vac change;  Surgeon: [name], MD;  Location: [place] OR;  Service: Open Heart Surgery;;   APPLICATION OF WOUND VAC N/A 09/04/2019   Procedure: Application Of Wound Vac;  Surgeon: Grace Tavita, MD;  Location: [facility]  OR;  Service: Open Heart Surgery;  Laterality: N/A;   APPLICATION OF WOUND VAC N/A 09/06/2019   Procedure: APPLICATION OF ACELL, APPLICATION OF WOUND VAC USING PREVENA INCISIONAL  DRESSING;  Surgeon: Wallace Going, DO;  Location: [place];  Service: Plastics;  Laterality: N/A;   AV FISTULA PLACEMENT Left 09/18/2020   Procedure: ARTERIOVENOUS (AV) FISTULA CREATION LEFT VERSUS GRAFT;  Surgeon: Serafina Mitchell, MD;  Location: [place];  Service: Vascular;   Laterality: Left;   [place] Left 10/31/2020   Procedure: LEFT SECOND STAGE [place];  Surgeon: Serafina Mitchell, MD;  [facility];  Service: Vascular;  Laterality: Left;   CARDIAC CATHETERIZATION     CORONARY ARTERY BYPASS GRAFT N/A 08/15/2019   Procedure: CORONARY ARTERY BYPASS GRAFTING (CABG), x 4 using bilateral IMAs and left radial artery .  LIMA TO LAD, RIMA TO PDA, RADIAL ARTERY TO CIRC AND SEQUENTIALLY TO OM1.;  Surgeon: [name], MD;  Location: [place];  Service: Open Heart Surgery;  Laterality: N/A;   CORONARY STENT PLACEMENT  02/27/2014   distal rt/pd coronary       dr Einar Gip   CYSTO/ BLADDER BIOPSY'S/ CAUTHERIZATION  01-14-2004  DR Gaynelle Arabian   EXPLORATION POST OPERATIVE OPEN HEART N/A 08/16/2019   Procedure: Chest Closure S?P CABG WITH APPLICATION OF PREVENA  INCISIONAL WOUND VAC;  Surgeon: [name], MD;  Location: [place];  Service: Open Heart Surgery;  Laterality: N/A;   EXPLORATION POST OPERATIVE OPEN HEART N/A 08/21/2019   Procedure: CHEST WASHOUT S/P OPEN CHEST;  Surgeon: [name], MD;  Location: [place];  Service: Open Heart Surgery;  Laterality: N/A;  Open chest with Esmark dressing with Ioban sealant coverage.   EXPLORATION POST OPERATIVE OPEN HEART N/A 08/18/2019   Procedure: EXPLORATION POST OPERATIVE OPEN HEART (performed 04/23 on unit);  Surgeon: [name], MD;  Location: [place];  Service: Open Heart Surgery;  Laterality: N/A;   EXPLORATION POST OPERATIVE OPEN HEART N/A 08/24/2019   Procedure: CHEST WASHOUT POST OPERATIVE OPEN HEART;  Surgeon: [name], MD;  Location: [place];  Service: Open Heart Surgery;  Laterality: N/A;   EXPLORATION POST OPERATIVE OPEN HEART N/A 08/29/2019   Procedure: CHEST WOUND WASHOUT POST OPERATIVE OPEN HEART;  Surgeon: [name], MD;  Location: [place];  Service: Open Heart Surgery;  Laterality: N/A;   EXPLORATION POST OPERATIVE OPEN HEART N/A 09/04/2019   Procedure:  MEDIASTINAL EXPLORATION WITH STERNAL WOUND IRRIGATION;  Surgeon: Grace Loyalty, MD;  Location: [place];  Service: Open Heart Surgery;  Laterality: N/A;   EXPLORATION POST OPERATIVE OPEN HEART N/A 09/14/2019   Procedure: EVACUATION OF HEMATOMA;  Surgeon: Grace Zacharia, MD;  Location: [place];  Service: Open Heart Surgery;  Laterality: N/A;   IR FLUORO GUIDE CV LINE LEFT  09/19/2019   IR GASTROSTOMY TUBE MOD SED  10/04/2019   IR GASTROSTOMY TUBE REMOVAL  11/29/2019   IR PATIENT EVAL TECH 0-60 MINS  09/29/2019   IR US GUIDE VASC ACCESS LEFT  09/19/2019   LAPAROSCOPIC LYSIS OF ADHESIONS N/A 09/06/2019   Procedure: LAPAROSCOPIC OMENTAL HARVEST;  Surgeon: Mickeal Skinner, MD;  Location: [place];  Service: General;  Laterality: N/A;   LEFT HEART CATH AND CORONARY ANGIOGRAPHY N/A 08/10/2019   Procedure: LEFT HEART CATH AND CORONARY ANGIOGRAPHY;  Surgeon: Nigel Mormon, MD;  Location: [place] CV LAB;  Service: Cardiovascular;  Laterality: N/A;   LEFT HEART CATHETERIZATION WITH CORONARY  ANGIOGRAM N/A 02/27/2014   Procedure: LEFT HEART CATHETERIZATION WITH CORONARY ANGIOGRAM;  Surgeon: Laverda Page, MD;  Location: [facility] CATH LAB;  Service: Cardiovascular;  Laterality: N/A;   MEDIASTINAL EXPLORATION N/A 09/06/2019   Procedure: MEDIASTINAL EXPLORATION;  Surgeon: Grace Lorik, MD;  Location: [place];  Service: Thoracic;  Laterality: N/A;   PECTORALIS FLAP  09/06/2019   Procedure: Pectoralis ADVANCEMENT Flap;  Surgeon: Wallace Going, DO;  Location: [place];  Service: Plastics;;   PERCUTANEOUS CORONARY STENT INTERVENTION (PCI-S)  02/27/2014   Procedure: PERCUTANEOUS CORONARY STENT INTERVENTION (PCI-S);  Surgeon: Laverda Page, MD;  Location: [facility] CATH LAB;  Service: Cardiovascular;;  rt PDA  3.0/28mm Promus stent   RADIAL ARTERY HARVEST Left 08/15/2019   Procedure: Radial Artery Harvest;  Surgeon: [name], MD;  Location: [place];  Service: Open Heart Surgery;  Laterality: Left;   RIB  PLATING N/A 09/06/2019   Procedure: STERNAL PLATING;  Surgeon: Grace Ad, MD;  Location: [place];  Service: Thoracic;  Laterality: N/A;   TEE WITHOUT CARDIOVERSION N/A 08/15/2019   Procedure: TRANSESOPHAGEAL ECHOCARDIOGRAM (TEE);  Surgeon: [name], MD;  Location: [place];  Service: Open Heart Surgery;  Laterality: N/A;   TRACHEOSTOMY TUBE PLACEMENT  08/29/2019   Procedure: Tracheostomy;  Surgeon: [name], MD;  [facility];  Service: Open Heart Surgery; Decannulated 6/10/20211   TRANSURETHRAL RESECTION OF PROSTATE  04/04/2012   Procedure: TRANSURETHRAL RESECTION OF THE PROSTATE WITH GYRUS INSTRUMENTS;  Surgeon: Ailene Rud, MD;  [facility];  Service: Urology;  Laterality: N/A;   TRANSURETHRAL RESECTION OF PROSTATE N/A 09/27/2014   Procedure: TRANSURETHRAL RESECTION OF THE PROSTATE ;  Surgeon: Carolan Clines, MD;  Location: [facility];  Service: Urology;  Laterality: N/A;   UPPER GASTROINTESTINAL ENDOSCOPY     Current Outpatient Medications on File Prior to Visit  Medication Sig Dispense Refill   albuterol (VENTOLIN HFA) 108 (90 Base) MCG/ACT inhaler TAKE 2 PUFFS BY MOUTH EVERY 6 HOURS AS NEEDED FOR WHEEZE OR SHORTNESS OF BREATH 8.5 each 3   aspirin EC 81 MG tablet Take 81 mg by mouth daily.     atorvastatin (LIPITOR) 20 MG tablet TAKE 1 TABLET BY MOUTH EVERY DAY IN THE EVENING 90 tablet 3   budesonide-formoterol (SYMBICORT) 160-4.5 MCG/ACT inhaler INHALE 2 PUFFS INTO THE LUNGS TWICE A DAY 10.2 each 6   clonazePAM (KLONOPIN) 0.5 MG disintegrating tablet TAKE 1 TABLET BY MOUTH TWICE A DAY 60 tablet 5   clopidogrel (PLAVIX) 75 MG tablet TAKE 1 TABLET BY MOUTH EVERY DAY 90 tablet 2   cromolyn (OPTICROM) 4 % ophthalmic solution Place 1 drop into both eyes every Wednesday. At bedtime     [employer] ACETAMINOPHEN 325 MG tablet TAKE 2 CAPS BY MOUTH EVERY 6 HOURS AS NEEDED FOR PAIN FOR TEMP 100F OR ABOVE (Patient taking differently: Take 325 mg by mouth  every 6 (six) hours as needed for mild pain or headache (temp over 100).) 60 tablet 0   [employer] SENNA PLUS 8.6-50 MG tablet TAKE 2 TABLETS BY MOUTH AT BEDTIME FOR CONSTIPATION (Patient not taking: Reported on 07/15/2022) 60 tablet 1   ferrous sulfate 325 (65 FE) MG tablet Take 325 mg by mouth daily with breakfast.     fluticasone (FLONASE) 50 MCG/ACT nasal spray SPRAY 1 SPRAY INTO EACH NOSTRIL EVERY DAY 48 mL 3   furosemide (LASIX) 80 MG tablet Take 120 mg by mouth 2 (two) times daily.  hydrALAZINE (APRESOLINE) 100 MG tablet Take 1 tablet (100 mg total) by mouth 3 (three) times daily. 270 tablet 3   isosorbide dinitrate (ISORDIL) 30 MG tablet TAKE 1 TABLET BY MOUTH THREE TIMES A DAY 270 tablet 1   metolazone (ZAROXOLYN) 5 MG tablet Take 5 mg by mouth 2 (two) times a week.     metoprolol succinate (TOPROL-XL) 50 MG 24 hr tablet Take 3 tablets (150 mg total) by mouth daily. Take with or immediately following a meal. 270 tablet 1   Multiple Vitamin (MULTIVITAMIN) tablet Take 1 tablet by mouth daily.     nitroGLYCERIN (NITROSTAT) 0.4 MG SL tablet Place 0.4 mg under the tongue every 5 (five) minutes as needed for chest pain.     potassium chloride (KLOR-CON) 10 MEQ tablet Take 10 mEq by mouth daily.     PREVIDENT 5000 BOOSTER PLUS 1.1 % PSTE Take 1 application  by mouth every evening. 100 mL 6   sertraline (ZOLOFT) 25 MG tablet TAKE 1 TABLET BY MOUTH EVERY DAY 90 tablet 3   silodosin (RAPAFLO) 8 MG CAPS capsule Take 1 capsule (8 mg total) by mouth at bedtime. 90 capsule 3   Vitamin D, Ergocalciferol, (DRISDOL) 1.25 MG (50000 UNIT) CAPS capsule TAKE 1 CAPSULE (50,000 UNITS TOTAL) BY MOUTH EVERY 7 (SEVEN) DAYS 12 capsule 3   No current facility-administered medications on file prior to visit.    No Known Allergies Social History   Occupational History   Occupation: Retired  Tobacco Use   Smoking status: Former    Packs/day: 0.50    Years: 20.00    Additional pack years: 0.00    Total pack years:  10.00    Types: Cigarettes    Quit date: 1975    Years since quitting: 49.2   Smokeless tobacco: Never  Vaping Use   Vaping Use: Never used  Substance and Sexual Activity   Alcohol use: Not Currently    Comment: very rarely - every now and then 1 drink/1 year if that   Drug use: No   Sexual activity: Not Currently   Family History  Problem Relation Age of Onset   Diabetes Mother    Hypertension Mother    Diabetes Father    Diabetes Brother    Hypertension Brother    Bone cancer Brother    Diabetes Brother    Immunization History  Administered Date(s) Administered   Fluad Quad(high Dose 65+) 01/09/2019, 01/05/2022   Influenza, High Dose Seasonal PF 02/08/2017, 01/17/2018, 01/30/2020   Influenza,inj,Quad PF,6+ Mos 02/28/2014, 02/11/2015   Influenza-Unspecified 03/11/2017, 12/06/2020   PFIZER(Purple Top)SARS-COV-2 Vaccination 06/01/2019, 06/22/2019, 01/18/2020, 12/06/2020   Pneumococcal Conjugate-13 08/13/2015, 01/09/2019   Pneumococcal Polysaccharide-23 02/28/2014   Tdap 08/13/2015     Review of Systems: Negative except as noted in the HPI.   Objective: Vitals:   07/15/22 1342  BP: 110/66   Christian [name] is a pleasant 77 y.o. male in NAD. AAO X 3.  Vascular Examination: {jgvascular:23595}  Dermatological Examination: {jgderm:23598}  Neurological Examination: Protective sensation diminished with 10g monofilament b/l. Vibratory sensation intact b/l.  Musculoskeletal Examination: Muscle strength 5/5 to all lower extremity muscle groups bilaterally. Pes planus deformity noted bilateral LE. Wearing diabetic shoes today. Utilizes cane for ambulation assistance.  Footwear Assessment: Does the patient wear appropriate shoes? {Yes,No}. Does the patient need inserts/orthotics? {Yes,No}.  Lab Results  Component Value Date   HGBA1C 7.9 (H) 07/14/2022   No results found. ADA Risk Categorization: High Risk  Patient has one  or more of the following: Loss of  protective sensation Absent pedal pulses Severe Foot deformity History of foot ulcer  Assessment: 1. Pain due to onychomycosis of toenails of both feet   2. Pes planus of both feet   3. Diabetic polyneuropathy associated with type 2 diabetes mellitus ([place])   4. Encounter for diabetic foot exam ([place])     Plan: {jgplan:23602::"-Patient/POA to call should there be question/concern in the interim."} Return in about 3 months (around 10/15/2022).  Marzetta Board, DPM

## 2022-07-16 ENCOUNTER — Encounter: Payer: Self-pay | Admitting: Podiatry

## 2022-07-17 ENCOUNTER — Encounter: Payer: Self-pay | Admitting: Internal Medicine

## 2022-07-17 DIAGNOSIS — R413 Other amnesia: Secondary | ICD-10-CM | POA: Insufficient documentation

## 2022-07-17 NOTE — Assessment & Plan Note (Signed)
Checking CBC today 

## 2022-07-17 NOTE — Assessment & Plan Note (Signed)
With a lot of scratching and nails are shorter than previous but due to several nails breaking he is encouraged to trim nails and avoid scratching.

## 2022-07-17 NOTE — Assessment & Plan Note (Signed)
Patient and wife are hard to read on this history. The cardiology note indicates some severe loss and changes although this is not concordant with my exam. 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 Checking B12, CBC, CMP, vitamin D, CT head to assess and referral to neurology per patient and wife request.

## 2022-07-19 ENCOUNTER — Other Ambulatory Visit: Payer: Self-pay | Admitting: Internal Medicine

## 2022-07-21 ENCOUNTER — Encounter: Payer: Self-pay | Admitting: Physician Assistant

## 2022-07-22 ENCOUNTER — Other Ambulatory Visit: Payer: Self-pay | Admitting: *Deleted

## 2022-07-22 ENCOUNTER — Ambulatory Visit (HOSPITAL_COMMUNITY)
Admission: RE | Admit: 2022-07-22 | Discharge: 2022-07-22 | Disposition: A | Discharge status: Home / Self Care | Payer: Medicare HMO | Source: Ambulatory Visit | Attending: Vascular Surgery | Admitting: Vascular Surgery

## 2022-07-22 DIAGNOSIS — N186 End stage renal disease: Secondary | ICD-10-CM

## 2022-07-22 DIAGNOSIS — Z992 Dependence on renal dialysis: Secondary | ICD-10-CM

## 2022-07-27 ENCOUNTER — Encounter: Payer: Self-pay | Admitting: Internal Medicine

## 2022-07-30 ENCOUNTER — Ambulatory Visit (INDEPENDENT_AMBULATORY_CARE_PROVIDER_SITE_OTHER): Payer: Medicare HMO | Admitting: Physician Assistant

## 2022-07-30 VITALS — BP 128/82 | HR 95 | Temp 98.0°F | Ht 69.0 in | Wt 208.8 lb

## 2022-07-30 DIAGNOSIS — N184 Chronic kidney disease, stage 4 (severe): Secondary | ICD-10-CM | POA: Diagnosis not present

## 2022-07-30 NOTE — Progress Notes (Signed)
[facility] [place] HISTORY AND PHYSICAL   History of Present Illness:  Patient is a 77 y.o. year old male who presents for examination of his left UE access. He has CKD  and the access was placed back in 7/22.  He was seen recently by Dr. Malen Gauze his nephrologist.  She was concerned due to loss of thrill or weak thrill in the left UE AV fistula.  I don't have a report from DR. Foster about his kidney function, but the last BMET showed serum Creatinine of 3.08 mg/dl.  And GFR 19 ml/min.      He denies pain, weakness or loss of sensation.  He has no symptoms of steal.   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  LIMA TO LAD, RIMA TO PDA, RADIAL ARTERY TO CIRC AND SEQUENTIALLY TO OM1. 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--  eats small bites and drink liquids slowly   SOB (shortness of breath) on exertion     Past Surgical History:  Procedure Laterality Date   APPLICATION OF WOUND VAC N/A 08/24/2019   Procedure: APPLICATION OF WOUND VAC;  Surgeon: Linden Dolin, MD;  Location: [facility] OR;  Service: Thoracic;  Laterality: N/A;   APPLICATION OF WOUND VAC  08/29/2019   Procedure: Wound Vac change;  Surgeon: Linden Dolin, MD;  Location: [facility];  Service: Open Heart Surgery;;   APPLICATION OF WOUND VAC N/A 09/04/2019   Procedure: Application Of Wound Vac;  Surgeon: Delight Ovens, MD;  Location: [facility] OR;  Service: Open Heart  Surgery;  Laterality: N/A;   APPLICATION OF WOUND VAC N/A 09/06/2019   Procedure: APPLICATION OF ACELL, APPLICATION OF WOUND VAC USING PREVENA INCISIONAL  DRESSING;  Surgeon: Peggye Form, DO;  Location: [facility] OR;  Service: Plastics;  Laterality: N/A;   AV FISTULA PLACEMENT Left 09/18/2020   Procedure: ARTERIOVENOUS (AV) FISTULA CREATION LEFT VERSUS GRAFT;  Surgeon: Nada Libman, MD;  Location: [facility];  Service: Vascular;  Laterality: Left;   BASCILIC VEIN TRANSPOSITION Left 10/31/2020   Procedure: LEFT SECOND STAGE BASCILIC VEIN TRANSPOSITION;  Surgeon: Nada Libman, MD;  [facility];  Service: Vascular;  Laterality: Left;   CARDIAC CATHETERIZATION     CORONARY ARTERY BYPASS GRAFT N/A 08/15/2019   Procedure: CORONARY ARTERY BYPASS GRAFTING (CABG), x 4 using bilateral IMAs and left radial artery .  LIMA TO LAD, RIMA TO PDA, RADIAL ARTERY TO CIRC AND SEQUENTIALLY TO OM1.;  Surgeon: Linden Dolin, MD;  Location: [facility];  Service: Open Heart Surgery;  Laterality: N/A;   CORONARY STENT PLACEMENT  02/27/2014   distal rt/pd coronary       dr Jacinto Halim   CYSTO/ BLADDER BIOPSY'S/ CAUTHERIZATION  01-14-2004  DR Patsi Sears   EXPLORATION POST OPERATIVE OPEN HEART N/A  08/16/2019   Procedure: Chest Closure S?P CABG WITH APPLICATION OF PREVENA  INCISIONAL WOUND VAC;  Surgeon: [name], MD;  Location: [place];  Service: Open Heart Surgery;  Laterality: N/A;   EXPLORATION POST OPERATIVE OPEN HEART N/A 08/21/2019   Procedure: CHEST WASHOUT S/P OPEN CHEST;  Surgeon: [name], MD;  Location: [place];  Service: Open Heart Surgery;  Laterality: N/A;  Open chest with Esmark dressing with Ioban sealant coverage.   EXPLORATION POST OPERATIVE OPEN HEART N/A 08/18/2019   Procedure: EXPLORATION POST OPERATIVE OPEN HEART (performed 04/23 on unit);  Surgeon: [name], MD;  Location: [place];  Service: Open Heart Surgery;  Laterality: N/A;   EXPLORATION POST OPERATIVE OPEN HEART N/A 08/24/2019    Procedure: CHEST WASHOUT POST OPERATIVE OPEN HEART;  Surgeon: [name], MD;  Location: [place];  Service: Open Heart Surgery;  Laterality: N/A;   EXPLORATION POST OPERATIVE OPEN HEART N/A 08/29/2019   Procedure: CHEST WOUND WASHOUT POST OPERATIVE OPEN HEART;  Surgeon: [name], MD;  Location: [place];  Service: Open Heart Surgery;  Laterality: N/A;   EXPLORATION POST OPERATIVE OPEN HEART N/A 09/04/2019   Procedure: MEDIASTINAL EXPLORATION WITH STERNAL WOUND IRRIGATION;  Surgeon: Grace Divante, MD;  Location: [place];  Service: Open Heart Surgery;  Laterality: N/A;   EXPLORATION POST OPERATIVE OPEN HEART N/A 09/14/2019   Procedure: EVACUATION OF HEMATOMA;  Surgeon: Grace Angad, MD;  Location: [place];  Service: Open Heart Surgery;  Laterality: N/A;   IR FLUORO GUIDE CV LINE LEFT  09/19/2019   IR GASTROSTOMY TUBE MOD SED  10/04/2019   IR GASTROSTOMY TUBE REMOVAL  11/29/2019   IR PATIENT EVAL TECH 0-60 MINS  09/29/2019   IR US GUIDE VASC ACCESS LEFT  09/19/2019   LAPAROSCOPIC LYSIS OF ADHESIONS N/A 09/06/2019   Procedure: LAPAROSCOPIC OMENTAL HARVEST;  Surgeon: Mickeal Skinner, MD;  Location: [place];  Service: General;  Laterality: N/A;   LEFT HEART CATH AND CORONARY ANGIOGRAPHY N/A 08/10/2019   Procedure: LEFT HEART CATH AND CORONARY ANGIOGRAPHY;  Surgeon: Nigel Mormon, MD;  Location: [place] CV LAB;  Service: Cardiovascular;  Laterality: N/A;   LEFT HEART CATHETERIZATION WITH CORONARY ANGIOGRAM N/A 02/27/2014   Procedure: LEFT HEART CATHETERIZATION WITH CORONARY ANGIOGRAM;  Surgeon: Laverda Page, MD;  Location: [facility] CATH LAB;  Service: Cardiovascular;  Laterality: N/A;   MEDIASTINAL EXPLORATION N/A 09/06/2019   Procedure: MEDIASTINAL EXPLORATION;  Surgeon: Grace Erasmo, MD;  Location: [place];  Service: Thoracic;  Laterality: N/A;   PECTORALIS FLAP  09/06/2019   Procedure: Pectoralis ADVANCEMENT Flap;  Surgeon: Wallace Going, DO;  Location: [place];  Service:  Plastics;;   PERCUTANEOUS CORONARY STENT INTERVENTION (PCI-S)  02/27/2014   Procedure: PERCUTANEOUS CORONARY STENT INTERVENTION (PCI-S);  Surgeon: Laverda Page, MD;  Location: [facility] CATH LAB;  Service: Cardiovascular;;  rt PDA  3.0/28mm Promus stent   RADIAL ARTERY HARVEST Left 08/15/2019   Procedure: Radial Artery Harvest;  Surgeon: [name], MD;  Location: [place];  Service: Open Heart Surgery;  Laterality: Left;   RIB PLATING N/A 09/06/2019   Procedure: STERNAL PLATING;  Surgeon: Grace Dontre, MD;  Location: [place];  Service: Thoracic;  Laterality: N/A;   TEE WITHOUT CARDIOVERSION N/A 08/15/2019   Procedure: TRANSESOPHAGEAL ECHOCARDIOGRAM (TEE);  Surgeon: [name], MD;  Location: [place];  Service: Open Heart Surgery;  Laterality: N/A;   TRACHEOSTOMY TUBE PLACEMENT  08/29/2019  Procedure: Tracheostomy;  Surgeon: [name], MD;  Location: [place];  Service: Open Heart Surgery; Decannulated 6/10/20211   TRANSURETHRAL RESECTION OF PROSTATE  04/04/2012   Procedure: TRANSURETHRAL RESECTION OF THE PROSTATE WITH GYRUS INSTRUMENTS;  Surgeon: Ailene Rud, MD;  Location[facility];  Service: Urology;  Laterality: N/A;   TRANSURETHRAL RESECTION OF PROSTATE N/A 09/27/2014   Procedure: TRANSURETHRAL RESECTION OF THE PROSTATE ;  Surgeon: Carolan Clines, MD;  Location: [facility];  Service: Urology;  Laterality: N/A;   UPPER GASTROINTESTINAL ENDOSCOPY       Social History Social History   Tobacco Use   Smoking status: Former    Packs/day: 0.50    Years: 20.00    Additional pack years: 0.00    Total pack years: 10.00    Types: Cigarettes    Quit date: 1975    Years since quitting: 49.2   Smokeless tobacco: Never  Vaping Use   Vaping Use: Never used  Substance Use Topics   Alcohol use: Not Currently    Comment: very rarely - every now and then 1 drink/1 year if that   Drug use: No    Family History Family History  Problem Relation Age of Onset    Diabetes Mother    Hypertension Mother    Diabetes Father    Diabetes Brother    Hypertension Brother    Bone cancer Brother    Diabetes Brother     Allergies  No Known Allergies   Current Outpatient Medications  Medication Sig Dispense Refill   albuterol (VENTOLIN HFA) 108 (90 Base) MCG/ACT inhaler TAKE 2 PUFFS BY MOUTH EVERY 6 HOURS AS NEEDED FOR WHEEZE OR SHORTNESS OF BREATH 8.5 each 3   aspirin EC 81 MG tablet Take 81 mg by mouth daily.     atorvastatin (LIPITOR) 20 MG tablet TAKE 1 TABLET BY MOUTH EVERY DAY IN THE EVENING 90 tablet 3   budesonide-formoterol (SYMBICORT) 160-4.5 MCG/ACT inhaler INHALE 2 PUFFS INTO THE LUNGS TWICE A DAY 10.2 each 6   clonazePAM (KLONOPIN) 0.5 MG disintegrating tablet TAKE 1 TABLET BY MOUTH TWICE A DAY 60 tablet 5   clopidogrel (PLAVIX) 75 MG tablet TAKE 1 TABLET BY MOUTH EVERY DAY 90 tablet 2   cromolyn (OPTICROM) 4 % ophthalmic solution Place 1 drop into both eyes every Wednesday. At bedtime     [employer] ACETAMINOPHEN 325 MG tablet TAKE 2 CAPS BY MOUTH EVERY 6 HOURS AS NEEDED FOR PAIN FOR TEMP 100F OR ABOVE (Patient taking differently: Take 325 mg by mouth every 6 (six) hours as needed for mild pain or headache (temp over 100).) 60 tablet 0   [employer] SENNA PLUS 8.6-50 MG tablet TAKE 2 TABLETS BY MOUTH AT BEDTIME FOR CONSTIPATION (Patient not taking: Reported on 07/15/2022) 60 tablet 1   ferrous sulfate 325 (65 FE) MG tablet Take 325 mg by mouth daily with breakfast.     fluticasone (FLONASE) 50 MCG/ACT nasal spray SPRAY 1 SPRAY INTO EACH NOSTRIL EVERY DAY 48 mL 3   furosemide (LASIX) 80 MG tablet Take 120 mg by mouth 2 (two) times daily.     hydrALAZINE (APRESOLINE) 100 MG tablet Take 1 tablet (100 mg total) by mouth 3 (three) times daily. 270 tablet 3   isosorbide dinitrate (ISORDIL) 30 MG tablet TAKE 1 TABLET BY MOUTH THREE TIMES A DAY 270 tablet 1   metolazone (ZAROXOLYN) 5 MG tablet Take 5 mg by mouth 2 (two) times a week.  metoprolol succinate  (TOPROL-XL) 50 MG 24 hr tablet TAKE 3 TABLETS (150 MG TOTAL) BY MOUTH DAILY. TAKE WITH OR IMMEDIATELY FOLLOWING A MEAL. 270 tablet 1   Multiple Vitamin (MULTIVITAMIN) tablet Take 1 tablet by mouth daily.     nitroGLYCERIN (NITROSTAT) 0.4 MG SL tablet Place 0.4 mg under the tongue every 5 (five) minutes as needed for chest pain.     potassium chloride (KLOR-CON) 10 MEQ tablet Take 20 mEq by mouth daily.     PREVIDENT 5000 BOOSTER PLUS 1.1 % PSTE Take 1 application  by mouth every evening. 100 mL 6   sertraline (ZOLOFT) 25 MG tablet TAKE 1 TABLET BY MOUTH EVERY DAY 90 tablet 3   silodosin (RAPAFLO) 8 MG CAPS capsule Take 1 capsule (8 mg total) by mouth at bedtime. 90 capsule 3   Vitamin D, Ergocalciferol, (DRISDOL) 1.25 MG (50000 UNIT) CAPS capsule TAKE 1 CAPSULE (50,000 UNITS TOTAL) BY MOUTH EVERY 7 (SEVEN) DAYS 12 capsule 3   No current facility-administered medications for this visit.    ROS:   General:  No weight loss, Fever, chills  HEENT: No recent headaches, no nasal bleeding, no visual changes, no sore throat  Neurologic: No dizziness, blackouts, seizures. No recent symptoms of stroke or mini- stroke. No recent episodes of slurred speech, or temporary blindness.  Cardiac: No recent episodes of chest pain/pressure, no shortness of breath at rest.  No shortness of breath with exertion.  Denies history of atrial fibrillation or irregular heartbeat  Vascular: No history of rest pain in feet.  No history of claudication.  No history of non-healing ulcer, No history of DVT   Pulmonary: No home oxygen, no productive cough, no hemoptysis,  No asthma or wheezing  Musculoskeletal:  [ ]  Arthritis, [ ]  Low back pain,  [ ]  Joint pain  Hematologic:No history of hypercoagulable state.  No history of easy bleeding.  No history of anemia  Gastrointestinal: No hematochezia or melena,  No gastroesophageal reflux, no trouble swallowing  Urinary: [ ]  chronic Kidney disease, [ ]  on HD - [ ]  MWF or [  ] TTHS, [ ]  Burning with urination, [ ]  Frequent urination, [ ]  Difficulty urinating;   Skin: No rashes  Psychological: No history of anxiety,  No history of depression   Physical Examination  Vitals:   07/30/22 1313  BP: 128/82  Pulse: 95  Temp: 98 F (36.7 C)  TempSrc: Temporal  SpO2: 95%  Weight: 208 lb 12.8 oz (94.7 kg)  Height: 5\' 9"  (1.753 m)    Body mass index is 30.83 kg/m.  General:  Alert and oriented, no acute distress HEENT: Normal Neck: No bruit or JVD Pulmonary: Clear to auscultation bilaterally Cardiac: Regular Rate and Rhythm without murmur Gastrointestinal: Soft, non-tender, non-distended, no mass, no scars Skin: No rash, no ischemic changes Extremity palpable weaker thrill in the left UE AV fistula, cap refill brisk, motor intact equal B UE. Cap refill brisk, Radial artery was harvested for CAGB.  Musculoskeletal: No deformity or edema  Neurologic: Upper and lower extremity motor 5/5 and symmetric  DATA:  Findings:  +--------------------+----------+-----------------+--------+  AVF                PSV (cm/s)Flow Vol (mL/min)Comments  +--------------------+----------+-----------------+--------+  Native artery inflow   104           444                 +--------------------+----------+-----------------+--------+  AVF Anastomosis  92                               +--------------------+----------+-----------------+--------+     +------------+------------+------------------+--------------+--------------  ----+  OUTFLOW VEIN PSV (cm/s)   Diameter (cm)     Depth (cm)       Describe        +------------+------------+------------------+--------------+--------------  ----+  Shoulder        28            0.50            0.24                          +------------+------------+------------------+--------------+--------------  ----+  Prox UA          76            0.45            0.40                           +------------+------------+------------------+--------------+--------------  ----+  Mid UA       456 / 54 / 0.23 / 0.61 / 0.610.27 / 0.28 /   prox to mid  ua                    104                            0.28                          +------------+------------+------------------+--------------+--------------  ----+  Dist UA         461            0.29            1.30     change in  Diameter                                                         and audible  bruit   +------------+------------+------------------+--------------+--------------  ----+  AC Fossa         50            0.83            0.94                          +------------+------------+------------------+--------------+--------------  ----+      Summary:  Patent Basilic vein transposition with arteriovenous fistula-Velocities  less  than 100cm/s noted.  Two segments of arteriovenous graft-Stenosis noted.    ASSESSMENT/PLAN:  CKD with Basilic fistula creation 10/31/20.  CKD followed by Dr. Malen Gauze Nephrologist recommend he f/u when she noted decreased thrill in the fistula on exam a few weeks ago.  He is currently not on HD.   The fistulogram demonstrates areas of stenosis with a velocity of 461 cm/s.  The flow volume has declined as well now at 444 ml/min.     I will schedule him for fistulogram  in the next few weeks to try and prevent ESRD.  He and his wife agree to proceed  with this plan.  He may need ultrasound guidance and a small amount of contrast.  This was discussed with Dr. Karin Lieu.     He is managed medically on Plavix, ASA and daily Statin.       Mosetta Pigeon PA-C [facility] [place] Office: [phone]  MD in clinic [place]

## 2022-07-31 ENCOUNTER — Encounter: Payer: Self-pay | Admitting: Physician Assistant

## 2022-07-31 ENCOUNTER — Other Ambulatory Visit: Payer: Self-pay

## 2022-07-31 DIAGNOSIS — N184 Chronic kidney disease, stage 4 (severe): Secondary | ICD-10-CM

## 2022-08-03 ENCOUNTER — Other Ambulatory Visit: Payer: Self-pay | Admitting: Pulmonary Disease

## 2022-08-05 DIAGNOSIS — N184 Chronic kidney disease, stage 4 (severe): Secondary | ICD-10-CM | POA: Diagnosis not present

## 2022-08-06 ENCOUNTER — Encounter (HOSPITAL_COMMUNITY): Payer: Self-pay

## 2022-08-06 ENCOUNTER — Emergency Department (HOSPITAL_COMMUNITY)
Admission: EM | Admit: 2022-08-06 | Discharge: 2022-08-06 | Disposition: A | Discharge status: Home / Self Care | Payer: Medicare HMO | Attending: Emergency Medicine | Admitting: Emergency Medicine

## 2022-08-06 ENCOUNTER — Telehealth: Payer: Self-pay

## 2022-08-06 ENCOUNTER — Telehealth: Payer: Self-pay | Admitting: Nurse Practitioner

## 2022-08-06 ENCOUNTER — Emergency Department (HOSPITAL_COMMUNITY): Payer: Medicare HMO

## 2022-08-06 DIAGNOSIS — Z87891 Personal history of nicotine dependence: Secondary | ICD-10-CM | POA: Insufficient documentation

## 2022-08-06 DIAGNOSIS — Z23 Encounter for immunization: Secondary | ICD-10-CM | POA: Insufficient documentation

## 2022-08-06 DIAGNOSIS — N184 Chronic kidney disease, stage 4 (severe): Secondary | ICD-10-CM | POA: Insufficient documentation

## 2022-08-06 DIAGNOSIS — S80211A Abrasion, right knee, initial encounter: Secondary | ICD-10-CM | POA: Diagnosis not present

## 2022-08-06 DIAGNOSIS — Y92531 Health care provider office as the place of occurrence of the external cause: Secondary | ICD-10-CM | POA: Diagnosis not present

## 2022-08-06 DIAGNOSIS — D631 Anemia in chronic kidney disease: Secondary | ICD-10-CM | POA: Diagnosis not present

## 2022-08-06 DIAGNOSIS — W01198A Fall on same level from slipping, tripping and stumbling with subsequent striking against other object, initial encounter: Secondary | ICD-10-CM | POA: Diagnosis not present

## 2022-08-06 DIAGNOSIS — M4312 Spondylolisthesis, cervical region: Secondary | ICD-10-CM | POA: Diagnosis not present

## 2022-08-06 DIAGNOSIS — I509 Heart failure, unspecified: Secondary | ICD-10-CM | POA: Diagnosis not present

## 2022-08-06 DIAGNOSIS — Y9301 Activity, walking, marching and hiking: Secondary | ICD-10-CM | POA: Insufficient documentation

## 2022-08-06 DIAGNOSIS — Z79899 Other long term (current) drug therapy: Secondary | ICD-10-CM | POA: Insufficient documentation

## 2022-08-06 DIAGNOSIS — S0990XA Unspecified injury of head, initial encounter: Secondary | ICD-10-CM | POA: Diagnosis not present

## 2022-08-06 DIAGNOSIS — Z955 Presence of coronary angioplasty implant and graft: Secondary | ICD-10-CM | POA: Diagnosis not present

## 2022-08-06 DIAGNOSIS — I130 Hypertensive heart and chronic kidney disease with heart failure and stage 1 through stage 4 chronic kidney disease, or unspecified chronic kidney disease: Secondary | ICD-10-CM | POA: Diagnosis not present

## 2022-08-06 DIAGNOSIS — E1142 Type 2 diabetes mellitus with diabetic polyneuropathy: Secondary | ICD-10-CM | POA: Insufficient documentation

## 2022-08-06 DIAGNOSIS — I2510 Atherosclerotic heart disease of native coronary artery without angina pectoris: Secondary | ICD-10-CM | POA: Diagnosis not present

## 2022-08-06 DIAGNOSIS — S0083XA Contusion of other part of head, initial encounter: Secondary | ICD-10-CM | POA: Diagnosis not present

## 2022-08-06 DIAGNOSIS — Z7982 Long term (current) use of aspirin: Secondary | ICD-10-CM | POA: Diagnosis not present

## 2022-08-06 DIAGNOSIS — W19XXXA Unspecified fall, initial encounter: Secondary | ICD-10-CM

## 2022-08-06 MED ORDER — TETANUS-DIPHTH-ACELL PERTUSSIS 5-2.5-18.5 LF-MCG/0.5 IM SUSY
0.5000 mL | PREFILLED_SYRINGE | Freq: Once | INTRAMUSCULAR | Status: AC
  Administered 2022-08-06: 0.5 mL via INTRAMUSCULAR
  Filled 2022-08-06: qty 0.5

## 2022-08-06 NOTE — Telephone Encounter (Signed)
This patient was at our office with his wife who is my patient and upon leaving while getting in car he was reported to have fallen outside hitting his head, staff assisted him up and brought him in office to a room. We called EMS to come to evaluate and I recommended he go to ER for evaluation due to hitting his head while taking a blood thinner. Patient was able to communicate with this provider. EMS evaluated patient and they declined transport via EMS. Print production planner aware.

## 2022-08-06 NOTE — Discharge Instructions (Addendum)
Based on the events which brought you to the ER today, it is possible that you may have a concussion. A concussion occurs when there is a blow to the head or body, with enough force to shake the brain and disrupt how the brain functions. You may experience symptoms such as headaches, sensitivity to light/noise, dizziness, cognitive slowing, difficulty concentrating / remembering, trouble sleeping and drowsiness. These symptoms may last anywhere from hours/days to potentially weeks/months. While these symptoms are very frustrating and perhaps debilitating, it is important that you remember that they will improve over time. Everyone has a different rate of recovery; it is difficult to predict when your symptoms will resolve. In order to allow for your brain to heal after the injury, we recommend that you see your primary physician or a physician knowledgeable in concussion management. We also advise you to let your body and brain rest: avoid physical activities (sports, gym, and exercise) and reduce cognitive demands (reading, texting, TV watching, computer use, video games, etc). School attendance, after-school activities and work may need to be modified to avoid increasing symptoms. We recommend against driving until until all symptoms have resolved. Come back to the ER right away if you are having repeated episodes of vomiting, severe/worsening headache/dizziness or any other symptom that alarms you. We recommended that someone stay with you for the next 24 hours to monitor for these worrisome symptoms.   It was a pleasure caring for you today in the emergency department.  Please return to the emergency department for any worsening or worrisome symptoms.  

## 2022-08-06 NOTE — Progress Notes (Signed)
Chaplain responded to L2 trauma page. Pt was out of the room for testing. Chaplain introduced spiritual care and offered support to pt's spouse. Chaplain asked open ended questions to facilitate story telling and emotional expression. She shared that Mr Elwood had a heart attack 3 years ago and has had a long course of treatment and illness since then. They were at the doctor for her appointment today and she was celebrating a clean bill of health when he fell. Her faith is a source of strength in the midst of challenge.   Please page as further needs arise.  Maryanna Shape. Carley Hammed, M.Div. [facility] Chaplain Pager [phone] Office [phone]

## 2022-08-06 NOTE — ED Provider Notes (Signed)
[place] EMERGENCY DE[facility]MENT AT [facility] Provider Note  CSN: [id number] Arrival date & time: 08/06/22 1240  Chief Complaint(s) Fall  HPI Christian [name] is a 77 y.o. male with past medical history as below, significant for CABG, HLD, HTN, CHF, CAD, CKD PE who presents to the ED with complaint of fall.  Patient reports he was walking to his doctor's office, his foot caught the ground abnormally and he fell down.  Hit his head left forehead.  No LOC, he is on Plavix and aspirin.  He was ambulatory after the event.  He has mild discomfort to his face from the fall but no vision hearing change.  No numbness or tingling.  No weakness to extremities.  He has abrasion to his right knee.  Unsure last tetanus shot no chest pain, dyspnea, nausea or vomiting.  No syncope.  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  LIMA TO LAD, RIMA TO PDA, RADIAL ARTERY TO CIRC AND SEQUENTIALLY TO OM1. 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--  eats small bites and drink liquids slowly   SOB (shortness of breath) on exertion    Patient Active Problem List   Diagnosis Date Noted   Memory change 07/17/2022   Neck swelling 05/16/2021   Atypical pneumonia 12/12/2020   Pulmonary edema 10/03/2020   Sleep difficulties 05/30/2020   CKD (chronic kidney disease) stage 4, GFR 15-29 ml/min 03/27/2020   Aortic atherosclerosis 03/27/2020    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   Pruritus 11/28/2019   Coronary artery disease 08/15/2019   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  LIMA TO LAD, RIMA TO PDA, RADIAL ARTERY TO CIRC AND SEQUENTIALLY TO OM1. 08/15/2019   Ischemic cardiomyopathy    Swelling of lower extremity    History of coronary artery stent placement    Hx pulmonary embolism 08/09/2019   Elevated liver enzymes 08/09/2019   Left leg weakness 11/23/2018   CAD (coronary artery disease) 07/30/2018   Routine general medical examination at a health care facility 08/30/2016   Diabetes mellitus with polyneuropathy 10/10/2014   Benign prostatic hyperplasia 09/27/2014   Hyperlipidemia associated with type 2 diabetes mellitus 11/12/2009   MULTIPLE SCLEROSIS 11/12/2009   HTN (hypertension) 11/12/2009   Shortness of breath 11/12/2009   Home Medication(s) Prior to Admission medications   Medication Sig Start Date End Date Taking? Authorizing Provider  albuterol (VENTOLIN HFA) 108 (90 Base) MCG/ACT inhaler TAKE 2 PUFFS BY MOUTH EVERY 6 HOURS AS NEEDED FOR WHEEZE OR SHORTNESS OF BREATH 04/22/21   Myrlene Broker, MD  aspirin EC 81 MG tablet Take 81 mg by mouth daily.    [provider]  atorvastatin (LIPITOR) 20 MG tablet TAKE 1 TABLET BY MOUTH EVERY DAY  IN THE EVENING 05/11/22   Custovic, Rozell Searing, DO  budesonide-formoterol (SYMBICORT) 160-4.5 MCG/ACT inhaler INHALE 2 PUFFS INTO THE LUNGS TWICE A DAY 08/03/22   Martina Sinner, MD  clonazePAM (KLONOPIN) 0.5 MG disintegrating tablet TAKE 1 TABLET BY MOUTH TWICE A DAY 03/17/22   Myrlene Broker, MD  clopidogrel (PLAVIX) 75 MG tablet TAKE 1 TABLET BY MOUTH EVERY DAY 05/11/22   Custovic, Rozell Searing, DO  cromolyn (OPTICROM) 4 % ophthalmic solution Place 1 drop into both eyes every Wednesday. At bedtime 12/08/19   [provider]  [employer] ACETAMINOPHEN 325 MG tablet TAKE 2 CAPS BY MOUTH EVERY 6 HOURS AS NEEDED FOR PAIN FOR TEMP  100F OR ABOVE Patient taking differently: Take 325 mg by mouth every 6 (six) hours as needed for mild pain or headache (temp over 100). 02/23/20   Myrlene Broker, MD  [employer] SENNA PLUS 8.6-50 MG tablet TAKE 2 TABLETS BY MOUTH AT BEDTIME FOR CONSTIPATION Patient not taking: Reported on 07/15/2022 08/14/21   Myrlene Broker, MD  ferrous sulfate 325 (65 FE) MG tablet Take 325 mg by mouth daily with breakfast.    [provider]  fluticasone (FLONASE) 50 MCG/ACT nasal spray SPRAY 1 SPRAY INTO EACH NOSTRIL EVERY DAY 05/11/22   Martina Sinner, MD  furosemide (LASIX) 80 MG tablet Take 120 mg by mouth 2 (two) times daily. 11/30/20   [provider]  hydrALAZINE (APRESOLINE) 100 MG tablet Take 1 tablet (100 mg total) by mouth 3 (three) times daily. 01/01/22 12/27/22  Yates Decamp, MD  isosorbide dinitrate (ISORDIL) 30 MG tablet TAKE 1 TABLET BY MOUTH THREE TIMES A DAY 11/27/21   Cantwell, Celeste C, PA-C  metolazone (ZAROXOLYN) 5 MG tablet Take 5 mg by mouth 2 (two) times a week.    [provider]  metoprolol succinate (TOPROL-XL) 50 MG 24 hr tablet TAKE 3 TABLETS (150 MG TOTAL) BY MOUTH DAILY. TAKE WITH OR IMMEDIATELY FOLLOWING A MEAL. 07/20/22   Custovic, Rozell Searing, DO  Multiple Vitamin (MULTIVITAMIN) tablet Take 1 tablet by mouth daily.    [provider]  nitroGLYCERIN (NITROSTAT) 0.4 MG SL tablet Place 0.4 mg under the tongue every 5 (five) minutes as needed for chest pain. 10/25/19   [provider]  potassium chloride (KLOR-CON) 10 MEQ tablet Take 20 mEq by mouth daily. 10/25/20   [provider]  PREVIDENT 5000 BOOSTER PLUS 1.1 % PSTE Take 1 application  by mouth every evening. 07/14/22   Myrlene Broker, MD  sertraline (ZOLOFT) 25 MG tablet TAKE 1 TABLET BY MOUTH EVERY DAY 01/09/22   Myrlene Broker, MD  silodosin (RAPAFLO) 8 MG CAPS capsule Take 1 capsule (8 mg total) by mouth at bedtime. 09/25/21   McKenzie, Mardene Celeste, MD  Vitamin D,  Ergocalciferol, (DRISDOL) 1.25 MG (50000 UNIT) CAPS capsule TAKE 1 CAPSULE (50,000 UNITS TOTAL) BY MOUTH EVERY 7 (SEVEN) DAYS 05/05/22   Myrlene Broker, MD  Past Surgical History Past Surgical History:  Procedure Laterality Date   APPLICATION OF WOUND VAC N/A 08/24/2019   Procedure: APPLICATION OF WOUND VAC;  Surgeon: Linden Dolin, MD;  Location: [facility] OR;  Service: Thoracic;  Laterality: N/A;   APPLICATION OF WOUND VAC  08/29/2019   Procedure: Wound Vac change;  Surgeon: Linden Dolin, MD;  Location: [facility] OR;  Service: Open Heart Surgery;;   APPLICATION OF WOUND VAC N/A 09/04/2019   Procedure: Application Of Wound Vac;  Surgeon: Delight Ovens, MD;  Location: [facility] OR;  Service: Open Heart Surgery;  Laterality: N/A;   APPLICATION OF WOUND VAC N/A 09/06/2019   Procedure: APPLICATION OF ACELL, APPLICATION OF WOUND VAC USING PREVENA INCISIONAL  DRESSING;  Surgeon: Peggye Form, DO;  Location: [facility] OR;  Service: Plastics;  Laterality: N/A;   AV FISTULA PLACEMENT Left 09/18/2020   Procedure: ARTERIOVENOUS (AV) FISTULA CREATION LEFT VERSUS GRAFT;  Surgeon: Nada Libman, MD;  Location: [facility] OR;  Service: Vascular;  Laterality: Left;   BASCILIC VEIN TRANSPOSITION Left 10/31/2020   Procedure: LEFT SECOND STAGE BASCILIC VEIN TRANSPOSITION;  Surgeon: Nada Libman, MD;  Location: [facility] OR;  Service: Vascular;  Laterality: Left;   CARDIAC CATHETERIZATION     CORONARY ARTERY BYPASS GRAFT N/A 08/15/2019   Procedure: CORONARY ARTERY BYPASS GRAFTING (CABG), x 4 using bilateral IMAs and left radial artery .  LIMA TO LAD, RIMA TO PDA, RADIAL ARTERY TO CIRC AND SEQUENTIALLY TO OM1.;  Surgeon: Linden Dolin, MD;  Location: [facility] OR;  Service: Open Heart Surgery;  Laterality: N/A;   CORONARY STENT PLACEMENT  02/27/2014   distal rt/pd coronary       dr Jacinto Halim   CYSTO/ BLADDER  BIOPSY'S/ CAUTHERIZATION  01-14-2004  DR Patsi Sears   EXPLORATION POST OPERATIVE OPEN HEART N/A 08/16/2019   Procedure: Chest Closure S?P CABG WITH APPLICATION OF PREVENA  INCISIONAL WOUND VAC;  Surgeon: Linden Dolin, MD;  Location: [facility] OR;  Service: Open Heart Surgery;  Laterality: N/A;   EXPLORATION POST OPERATIVE OPEN HEART N/A 08/21/2019   Procedure: CHEST WASHOUT S/P OPEN CHEST;  Surgeon: Linden Dolin, MD;  Location: [facility] OR;  Service: Open Heart Surgery;  Laterality: N/A;  Open chest with Esmark dressing with Ioban sealant coverage.   EXPLORATION POST OPERATIVE OPEN HEART N/A 08/18/2019   Procedure: EXPLORATION POST OPERATIVE OPEN HEART (performed 04/23 on unit);  Surgeon: Linden Dolin, MD;  Location: [facility] OR;  Service: Open Heart Surgery;  Laterality: N/A;   EXPLORATION POST OPERATIVE OPEN HEART N/A 08/24/2019   Procedure: CHEST WASHOUT POST OPERATIVE OPEN HEART;  Surgeon: Linden Dolin, MD;  Location: [facility] OR;  Service: Open Heart Surgery;  Laterality: N/A;   EXPLORATION POST OPERATIVE OPEN HEART N/A 08/29/2019   Procedure: CHEST WOUND WASHOUT POST OPERATIVE OPEN HEART;  Surgeon: Linden Dolin, MD;  Location: [facility];  Service: Open Heart Surgery;  Laterality: N/A;   EXPLORATION POST OPERATIVE OPEN HEART N/A 09/04/2019   Procedure: MEDIASTINAL EXPLORATION WITH STERNAL WOUND IRRIGATION;  Surgeon: Delight Ovens, MD;  Location: [facility] OR;  Service: Open Heart Surgery;  Laterality: N/A;   EXPLORATION POST OPERATIVE OPEN HEART N/A 09/14/2019   Procedure: EVACUATION OF HEMATOMA;  Surgeon: Delight Ovens, MD;  Location: [facility] OR;  Service: Open Heart Surgery;  Laterality: N/A;   IR FLUORO GUIDE CV LINE LEFT  09/19/2019   IR GASTROSTOMY TUBE MOD SED  10/04/2019   IR GASTROSTOMY TUBE REMOVAL  11/29/2019  IR PATIENT EVAL TECH 0-60 MINS  09/29/2019   IR US GUIDE VASC ACCESS LEFT  09/19/2019   LAPAROSCOPIC LYSIS OF ADHESIONS N/A 09/06/2019   Procedure: LAPAROSCOPIC OMENTAL HARVEST;  Surgeon:  Sheliah Hatch De Blanch, MD;  Location: [facility] OR;  Service: General;  Laterality: N/A;   LEFT HEART CATH AND CORONARY ANGIOGRAPHY N/A 08/10/2019   Procedure: LEFT HEART CATH AND CORONARY ANGIOGRAPHY;  Surgeon: Elder Negus, MD;  Location: [facility] INVASIVE CV LAB;  Service: Cardiovascular;  Laterality: N/A;   LEFT HEART CATHETERIZATION WITH CORONARY ANGIOGRAM N/A 02/27/2014   Procedure: LEFT HEART CATHETERIZATION WITH CORONARY ANGIOGRAM;  Surgeon: Pamella Pert, MD;  Location: [facility] CATH LAB;  Service: Cardiovascular;  Laterality: N/A;   MEDIASTINAL EXPLORATION N/A 09/06/2019   Procedure: MEDIASTINAL EXPLORATION;  Surgeon: Delight Ovens, MD;  Location: [facility] OR;  Service: Thoracic;  Laterality: N/A;   PECTORALIS FLAP  09/06/2019   Procedure: Pectoralis ADVANCEMENT Flap;  Surgeon: Peggye Form, DO;  Location: [facility] OR;  Service: Plastics;;   PERCUTANEOUS CORONARY STENT INTERVENTION (PCI-S)  02/27/2014   Procedure: PERCUTANEOUS CORONARY STENT INTERVENTION (PCI-S);  Surgeon: Pamella Pert, MD;  Location: [facility] CATH LAB;  Service: Cardiovascular;;  rt PDA  3.0/28mm Promus stent   RADIAL ARTERY HARVEST Left 08/15/2019   Procedure: Radial Artery Harvest;  Surgeon: Linden Dolin, MD;  Location: [facility] OR;  Service: Open Heart Surgery;  Laterality: Left;   RIB PLATING N/A 09/06/2019   Procedure: STERNAL PLATING;  Surgeon: Delight Ovens, MD;  Location: [facility] OR;  Service: Thoracic;  Laterality: N/A;   TEE WITHOUT CARDIOVERSION N/A 08/15/2019   Procedure: TRANSESOPHAGEAL ECHOCARDIOGRAM (TEE);  Surgeon: Linden Dolin, MD;  Location: [facility] OR;  Service: Open Heart Surgery;  Laterality: N/A;   TRACHEOSTOMY TUBE PLACEMENT  08/29/2019   Procedure: Tracheostomy;  Surgeon: Linden Dolin, MD;  Location: [facility] OR;  Service: Open Heart Surgery; Decannulated 6/10/20211   TRANSURETHRAL RESECTION OF PROSTATE  04/04/2012   Procedure: TRANSURETHRAL RESECTION OF THE PROSTATE WITH GYRUS INSTRUMENTS;  Surgeon: Kathi Ludwig, MD;  Location: [facility];  Service: Urology;  Laterality: N/A;   TRANSURETHRAL RESECTION OF PROSTATE N/A 09/27/2014   Procedure: TRANSURETHRAL RESECTION OF THE PROSTATE ;  Surgeon: Jethro Bolus, MD;  Location: [facility] ORS;  Service: Urology;  Laterality: N/A;   UPPER GASTROINTESTINAL ENDOSCOPY     Family History Family History  Problem Relation Age of Onset   Diabetes Mother    Hypertension Mother    Diabetes Father    Diabetes Brother    Hypertension Brother    Bone cancer Brother    Diabetes Brother     Social History Social History   Tobacco Use   Smoking status: Former    Packs/day: 0.50    Years: 20.00    Additional pack years: 0.00    Total pack years: 10.00    Types: Cigarettes    Quit date: 1975    Years since quitting: 49.3   Smokeless tobacco: Never  Vaping Use   Vaping Use: Never used  Substance Use Topics   Alcohol use: Not Currently    Comment: very rarely - every now and then 1 drink/1 year if that   Drug use: No   Allergies Patient has no known allergies.  Review of Systems Review of Systems  Constitutional:  Negative for chills and fever.  HENT:  Negative for facial swelling and trouble swallowing.   Eyes:  Negative for photophobia and visual disturbance.  Respiratory:  Negative for cough and shortness of breath.   Cardiovascular:  Negative for chest pain and palpitations.  Gastrointestinal:  Negative for abdominal pain, nausea and vomiting.  Endocrine: Negative for polydipsia and polyuria.  Genitourinary:  Negative for difficulty urinating and hematuria.  Musculoskeletal:  Negative for gait problem and joint swelling.  Skin:  Positive for wound. Negative for pallor and rash.  Neurological:  Positive for headaches. Negative for syncope.  Psychiatric/Behavioral:  Negative for agitation and confusion.     Physical Exam Vital Signs  I have reviewed the triage vital signs BP (!) 144/89 (BP Location: Right Arm)   Pulse  88   Temp 98.1 F (36.7 C) (Oral)   Resp (!) 24   Ht  (1.753 m)   Wt 94.8 kg   SpO2 98%   BMI 30.86 kg/m  Physical Exam Vitals and nursing note reviewed.  Constitutional:      General: He is not in acute distress.    Appearance: Normal appearance. He is well-developed. He is not ill-appearing.  HENT:     Head: Normocephalic. No raccoon eyes, Battle's sign, right periorbital erythema or left periorbital erythema.     Comments: Small hematoma left orbit    Right Ear: External ear normal.     Left Ear: External ear normal.     Mouth/Throat:     Mouth: Mucous membranes are moist.  Eyes:     General: No scleral icterus.    Extraocular Movements: Extraocular movements intact.     Pupils: Pupils are equal, round, and reactive to light.  Cardiovascular:     Rate and Rhythm: Normal rate and regular rhythm.     Pulses: Normal pulses.     Heart sounds: Normal heart sounds.  Pulmonary:     Effort: Pulmonary effort is normal. No respiratory distress.     Breath sounds: Normal breath sounds.  Abdominal:     General: Abdomen is flat.     Palpations: Abdomen is soft.     Tenderness: There is no abdominal tenderness.  Musculoskeletal:     Cervical back: No rigidity.     Right lower leg: No edema.     Left lower leg: No edema.       Legs:     Comments: Pelvis stable to AP pressure  No significant discomfort to logroll either lower extremity  No midline spinous process tenderness palpation/ percussion  Skin:    General: Skin is warm and dry.     Capillary Refill: Capillary refill takes less than 2 seconds.  Neurological:     Mental Status: He is alert and oriented to person, place, and time. Mental status is at baseline.     GCS: GCS eye subscore is 4. GCS verbal subscore is 5. GCS motor subscore is 6.     Cranial Nerves: Cranial nerves 2-12 are intact.     Sensory: Sensation is intact.     Motor: Motor function is intact. No tremor.     Coordination: Coordination is intact.      Comments: Strength 5/5 bilateral upper and lower extremities Currently at baseline per spouse at bedside  Psychiatric:        Mood and Affect: Mood normal.        Behavior: Behavior normal.     ED Results and Treatments Labs (all labs ordered are listed, but only abnormal results are displayed) Labs Reviewed - No data to display  Radiology CT Head Wo Contrast  Result Date: 08/06/2022 CLINICAL DATA:  Head trauma, intracranial arterial injury suspected; Neck trauma (Age >= 65y) EXAM: CT HEAD WITHOUT CONTRAST CT CERVICAL SPINE WITHOUT CONTRAST TECHNIQUE: Multidetector CT imaging of the head and cervical spine was performed following the standard protocol without intravenous contrast. Multiplanar CT image reconstructions of the cervical spine were also generated. RADIATION DOSE REDUCTION: This exam was performed according to the departmental dose-optimization program which includes automated exposure control, adjustment of the mA and/or kV according to patient size and/or use of iterative reconstruction technique. COMPARISON:  None Available. FINDINGS: CT HEAD FINDINGS Brain: No evidence of acute infarction, hemorrhage, hydrocephalus, extra-axial collection or mass lesion/mass effect. Cerebral atrophy. Vascular: No hyperdense vessel. Skull: No acute fracture. Sinuses/Orbits: Clear sinuses.  No acute orbital findings. Other: No mastoid effusions. CT CERVICAL SPINE FINDINGS Alignment: Anterolisthesis of C2 on C3, C3 on C4 and C4 on C5. Anterolisthesis C7 on T1. These findings are most likely degenerative in etiology given degenerative changes at these levels. Skull base and vertebrae: No acute fracture vertebral body heights are maintained. Soft tissues and spinal canal: No prevertebral fluid or swelling. No visible canal hematoma. Disc levels: Multilevel facet and uncovertebral  hypertrophy with varying degrees of neural foraminal stenosis, likely severe on the left at C6-C7. Upper chest: Visualized lung apices are clear. IMPRESSION: 1. No evidence of acute intracranial abnormality. 2. No evidence of acute fracture or traumatic malalignment. 3. 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 MRI could further characterize if clinically warranted. Electronically Signed   By: Feliberto HartsFrederick S Jones M.D.   On: 08/06/2022 13:19   CT Cervical Spine Wo Contrast  Result Date: 08/06/2022 CLINICAL DATA:  Head trauma, intracranial arterial injury suspected; Neck trauma (Age >= 65y) EXAM: CT HEAD WITHOUT CONTRAST CT CERVICAL SPINE WITHOUT CONTRAST TECHNIQUE: Multidetector CT imaging of the head and cervical spine was performed following the standard protocol without intravenous contrast. Multiplanar CT image reconstructions of the cervical spine were also generated. RADIATION DOSE REDUCTION: This exam was performed according to the departmental dose-optimization program which includes automated exposure control, adjustment of the mA and/or kV according to patient size and/or use of iterative reconstruction technique. COMPARISON:  None Available. FINDINGS: CT HEAD FINDINGS Brain: No evidence of acute infarction, hemorrhage, hydrocephalus, extra-axial collection or mass lesion/mass effect. Cerebral atrophy. Vascular: No hyperdense vessel. Skull: No acute fracture. Sinuses/Orbits: Clear sinuses.  No acute orbital findings. Other: No mastoid effusions. CT CERVICAL SPINE FINDINGS Alignment: Anterolisthesis of C2 on C3, C3 on C4 and C4 on C5. Anterolisthesis C7 on T1. These findings are most likely degenerative in etiology given degenerative changes at these levels. Skull base and vertebrae: No acute fracture vertebral body heights are maintained. Soft tissues and spinal canal: No prevertebral fluid or swelling. No visible canal hematoma. Disc levels: Multilevel facet and uncovertebral  hypertrophy with varying degrees of neural foraminal stenosis, likely severe on the left at C6-C7. Upper chest: Visualized lung apices are clear. IMPRESSION: 1. No evidence of acute intracranial abnormality. 2. No evidence of acute fracture or traumatic malalignment. 3. 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 MRI could further characterize if clinically warranted. Electronically Signed   By: Feliberto HartsFrederick S Jones M.D.   On: 08/06/2022 13:19    Pertinent labs & imaging results that were available during my care of the patient were reviewed by me and considered in my medical decision making (see MDM for details).  Medications Ordered in ED Medications  Tdap (BOOSTRIX) injection 0.5 mL (0.5 mLs Intramuscular Given 08/06/22 1359)                                                                                                                                     Procedures .Critical Care  Performed by: Sloan Leiter, DO Authorized by: Sloan Leiter, DO   Critical care provider statement:    Critical care time (minutes):  31   Critical care time was exclusive of:  Separately billable procedures and treating other patients   Critical care was necessary to treat or prevent imminent or life-threatening deterioration of the following conditions:  Trauma   Critical care was time spent personally by me on the following activities:  Development of treatment plan with patient or surrogate, discussions with consultants, evaluation of patient's response to treatment, examination of patient, ordering and review of laboratory studies, ordering and review of radiographic studies, ordering and performing treatments and interventions, pulse oximetry, re-evaluation of patient's condition, review of old charts and obtaining history from patient or surrogate   (including critical care time)  Medical Decision Making / ED Course    Medical Decision Making:    CYRIC WILFERT is a 77 y.o.  male with past medical history as below, significant for CABG, HLD, HTN, CHF, CAD, CKD PE who presents to the ED with complaint of fall.. The complaint involves an extensive differential diagnosis and also carries with it a high risk of complications and morbidity.  Serious etiology was considered. Ddx includes but is not limited to: Differential diagnoses for head trauma includes subdural hematoma, epidural hematoma, acute concussion, traumatic subarachnoid hemorrhage, cerebral contusions, etc.   Complete initial physical exam performed, notably the patient  was no acute distress, resting comfortably, primary survey completed.  level 2 trauma was activated PTA.    Reviewed and confirmed nursing documentation for past medical history, family history, social history.  Vital signs reviewed.     Primary survey complete, stable.  Airway intact, breathing spontaneously with clear breath sounds bilateral, equal pulses in all 4 extremities.  GCS 15, ANO x 3.  Neuroexam is nonfocal. Imaging obtained and is stable without signs of ICH or acute intracranial injury Tetanus updated No syncope, this was an apparent mechanical fall as described by pt and spouse Feeling much better.  Discussed concussion precautions with patient and spouse at bedside.  Strict return precautions were discussed  The patient improved significantly and was discharged in stable condition. Detailed discussions were had with the patient regarding current findings, and need for close f/u with PCP or on call doctor. The patient has been instructed to return immediately if the symptoms worsen in any way for re-evaluation. Patient verbalized understanding and is in agreement with current care plan. All questions answered prior to discharge.     Additional history obtained: -Additional history obtained from spouse -External records from outside source obtained and reviewed including:  Chart review including previous notes, labs, imaging,  consultation notes including home medications,  primary care documentation   Lab Tests: na  EKG   EKG Interpretation  Date/Time:    Ventricular Rate:    PR Interval:    QRS Duration:   QT Interval:    QTC Calculation:   R Axis:     Text Interpretation:           Imaging Studies ordered: I ordered imaging studies including CTH CTCS I independently visualized the following imaging with scope of interpretation limited to determining acute life threatening conditions related to emergency care; findings noted above, significant for no fx I independently visualized and interpreted imaging. I agree with the radiologist interpretation   Medicines ordered and prescription drug management: Meds ordered this encounter  Medications   Tdap (BOOSTRIX) injection 0.5 mL    -I have reviewed the patients home medicines and have made adjustments as needed   Consultations Obtained: na   Cardiac Monitoring: The patient was maintained on a cardiac monitor.  I personally viewed and interpreted the cardiac monitored which showed an underlying rhythm of: SR  Social Determinants of Health:  Diagnosis or treatment significantly limited by social determinants of health: former smoker   Reevaluation: After the interventions noted above, I reevaluated the patient and found that they have improved  Co morbidities that complicate the patient evaluation  Past Medical History:  Diagnosis Date   Anemia    low iron   BPH (benign prostatic hypertrophy)    CHF (congestive heart failure)    Chronic kidney disease    Coronary artery disease    Dry eyes left   Hiatal hernia    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  LIMA TO LAD, RIMA TO PDA, RADIAL ARTERY TO CIRC AND SEQUENTIALLY TO OM1. 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    Final Clinical Impression(s) / ED Diagnoses Final diagnoses:  Minor head injury, initial encounter  Fall, initial encounter     This chart was dictated using voice recognition software.  Despite best efforts to proofread,  errors can occur which can change the documentation meaning.    Sloan Leiter, DO 08/07/22 [phone]

## 2022-08-06 NOTE — Telephone Encounter (Signed)
Patient presents today at [facility] with his wife who had a visit, Patient waiting in lobby throughout the visit. Patient fell in parking lot staff at [facility] first noticed about 11:45, patient didn't have much movement in left leg and a small abrasion to the left side of his forehead. Staff assisted Christian [name] up and into a wheelchair, patient then waited in one of office rooms for EMS arrival.   Patient stated he was okay and not hurt.

## 2022-08-06 NOTE — ED Triage Notes (Signed)
Pt to ED POV with c/o fall. Pt states he was leaving out of his doctors office when he tripped and fell on the curb. Pt states that he fell on his right side and hit his head. Denies any LOC. +thinners, A&Ox4 in triage. C/o right knee pain. Small abrasion noted to right knee controlled bleeding. No other injuries noted.

## 2022-08-07 ENCOUNTER — Ambulatory Visit (INDEPENDENT_AMBULATORY_CARE_PROVIDER_SITE_OTHER): Payer: Federal, State, Local not specified - PPO

## 2022-08-07 DIAGNOSIS — M2142 Flat foot [pes planus] (acquired), left foot: Secondary | ICD-10-CM

## 2022-08-07 DIAGNOSIS — M2141 Flat foot [pes planus] (acquired), right foot: Secondary | ICD-10-CM

## 2022-08-07 DIAGNOSIS — E1142 Type 2 diabetes mellitus with diabetic polyneuropathy: Secondary | ICD-10-CM

## 2022-08-07 NOTE — Progress Notes (Signed)
Patient presents to the office today for diabetic shoe and insole measuring.  Patient was measured with brannock device to determine size and width for 1 pair of extra depth shoes and foam casted for 3 pair of insoles.   ABN signed.   Documentation of medical necessity will be sent to patient's treating diabetic doctor to verify and sign.   Patient's diabetic provider: Myrlene Broker, MD   Shoes and insoles will be ordered at that time and patient will be notified for an appointment for fitting when they arrive.   Brannock measurement: 10  Patient shoe selection-   1st   Shoe choice:    [id number]  Shoe size ordered: 10XW

## 2022-08-12 ENCOUNTER — Encounter (HOSPITAL_COMMUNITY)
Admission: RE | Disposition: A | Discharge status: Home / Self Care | Payer: Self-pay | Source: Home / Self Care | Attending: Vascular Surgery

## 2022-08-12 ENCOUNTER — Ambulatory Visit (HOSPITAL_COMMUNITY)
Admission: RE | Admit: 2022-08-12 | Discharge: 2022-08-12 | Disposition: A | Discharge status: Home / Self Care | Payer: Medicare HMO | Attending: Vascular Surgery | Admitting: Vascular Surgery

## 2022-08-12 ENCOUNTER — Other Ambulatory Visit: Payer: Self-pay

## 2022-08-12 DIAGNOSIS — T82858A Stenosis of vascular prosthetic devices, implants and grafts, initial encounter: Secondary | ICD-10-CM | POA: Insufficient documentation

## 2022-08-12 DIAGNOSIS — I129 Hypertensive chronic kidney disease with stage 1 through stage 4 chronic kidney disease, or unspecified chronic kidney disease: Secondary | ICD-10-CM | POA: Insufficient documentation

## 2022-08-12 DIAGNOSIS — T82898A Other specified complication of vascular prosthetic devices, implants and grafts, initial encounter: Secondary | ICD-10-CM

## 2022-08-12 DIAGNOSIS — Z7902 Long term (current) use of antithrombotics/antiplatelets: Secondary | ICD-10-CM | POA: Diagnosis not present

## 2022-08-12 DIAGNOSIS — Z79899 Other long term (current) drug therapy: Secondary | ICD-10-CM | POA: Diagnosis not present

## 2022-08-12 DIAGNOSIS — Z7982 Long term (current) use of aspirin: Secondary | ICD-10-CM | POA: Diagnosis not present

## 2022-08-12 DIAGNOSIS — Y832 Surgical operation with anastomosis, bypass or graft as the cause of abnormal reaction of the patient, or of later complication, without mention of misadventure at the time of the procedure: Secondary | ICD-10-CM | POA: Diagnosis not present

## 2022-08-12 DIAGNOSIS — N184 Chronic kidney disease, stage 4 (severe): Secondary | ICD-10-CM

## 2022-08-12 DIAGNOSIS — N189 Chronic kidney disease, unspecified: Secondary | ICD-10-CM | POA: Diagnosis not present

## 2022-08-12 DIAGNOSIS — N185 Chronic kidney disease, stage 5: Secondary | ICD-10-CM | POA: Diagnosis not present

## 2022-08-12 HISTORY — PX: A/V FISTULAGRAM: CATH118298

## 2022-08-12 HISTORY — PX: PERIPHERAL VASCULAR INTERVENTION: CATH118257

## 2022-08-12 LAB — POCT I-STAT, CHEM 8
BUN: 70 mg/dL — ABNORMAL HIGH (ref 8–23)
Calcium, Ion: 1.17 mmol/L (ref 1.15–1.40)
Chloride: 103 mmol/L (ref 98–111)
Creatinine, Ser: 3.5 mg/dL — ABNORMAL HIGH (ref 0.61–1.24)
Glucose, Bld: 147 mg/dL — ABNORMAL HIGH (ref 70–99)
HCT: 36 % — ABNORMAL LOW (ref 39.0–52.0)
Hemoglobin: 12.2 g/dL — ABNORMAL LOW (ref 13.0–17.0)
Potassium: 3.5 mmol/L (ref 3.5–5.1)
Sodium: 142 mmol/L (ref 135–145)
TCO2: 27 mmol/L (ref 22–32)

## 2022-08-12 SURGERY — A/V FISTULAGRAM
Anesthesia: LOCAL | Laterality: Left

## 2022-08-12 MED ORDER — LIDOCAINE HCL (PF) 1 % IJ SOLN
INTRAMUSCULAR | Status: DC | PRN
  Administered 2022-08-12 (×2): 10 mL

## 2022-08-12 MED ORDER — HEPARIN SODIUM (PORCINE) 1000 UNIT/ML IJ SOLN
INTRAMUSCULAR | Status: DC | PRN
  Administered 2022-08-12: 5000 [IU] via INTRAVENOUS

## 2022-08-12 MED ORDER — FENTANYL CITRATE (PF) 100 MCG/2ML IJ SOLN
INTRAMUSCULAR | Status: DC | PRN
  Administered 2022-08-12 (×2): 25 ug via INTRAVENOUS

## 2022-08-12 MED ORDER — HEPARIN (PORCINE) IN NACL 1000-0.9 UT/500ML-% IV SOLN
INTRAVENOUS | Status: DC | PRN
  Administered 2022-08-12 (×2): 500 mL

## 2022-08-12 MED ORDER — LIDOCAINE HCL (PF) 1 % IJ SOLN
INTRAMUSCULAR | Status: AC
  Filled 2022-08-12: qty 30

## 2022-08-12 MED ORDER — IODIXANOL 320 MG/ML IV SOLN
INTRAVENOUS | Status: DC | PRN
  Administered 2022-08-12: 70 mL

## 2022-08-12 MED ORDER — FENTANYL CITRATE (PF) 100 MCG/2ML IJ SOLN
INTRAMUSCULAR | Status: AC
  Filled 2022-08-12: qty 2

## 2022-08-12 SURGICAL SUPPLY — 24 items
BAG SNAP BAND KOVER 36X36 (MISCELLANEOUS) ×1 IMPLANT
BALLN MUSTANG 4.0X40 75 (BALLOONS) ×1
BALLN MUSTANG 6.0X20 75 (BALLOONS) ×1
BALLN MUSTANG 6.0X40 75 (BALLOONS) ×1
BALLOON MUSTANG 4.0X40 75 (BALLOONS) IMPLANT
BALLOON MUSTANG 6.0X20 75 (BALLOONS) IMPLANT
BALLOON MUSTANG 6.0X40 75 (BALLOONS) IMPLANT
CATH ANGIO 5F BER2 65CM (CATHETERS) IMPLANT
COVER DOME SNAP 22 D (MISCELLANEOUS) ×1 IMPLANT
DCB RANGER 5.0X40 135 (BALLOONS) IMPLANT
KIT ENCORE 26 ADVANTAGE (KITS) IMPLANT
KIT MICROPUNCTURE NIT STIFF (SHEATH) IMPLANT
PROTECTION STATION PRESSURIZED (MISCELLANEOUS) ×2
RANGER DCB 5.0X40 135 (BALLOONS) ×2
SHEATH PINNACLE R/O II 6F 4CM (SHEATH) IMPLANT
SHEATH PROBE COVER 6X72 (BAG) ×1 IMPLANT
STATION PROTECTION PRESSURIZED (MISCELLANEOUS) ×1 IMPLANT
STENT VIABAHN 6X50X120 (Permanent Stent) IMPLANT
STOPCOCK MORSE 400PSI 3WAY (MISCELLANEOUS) ×1 IMPLANT
TRAY PV CATH (CUSTOM PROCEDURE TRAY) ×1 IMPLANT
TUBING CIL FLEX 10 FLL-RA (TUBING) ×1 IMPLANT
WIRE BENTSON .035X145CM (WIRE) IMPLANT
WIRE G V18X300CM (WIRE) IMPLANT
WIRE TORQFLEX AUST .018X40CM (WIRE) IMPLANT

## 2022-08-12 NOTE — Progress Notes (Signed)
[facility] [place] HISTORY AND PHYSICAL   Patient seen and examined in preop holding.  No complaints. No changes to medication history or physical exam since last seen in clinic. I discussed Christian [name] with Dr. Malen [name], his nephrologist, specifically whether he would benefit from fistulogram and weighing the risks and benefits of contrast use in the setting of CKD.  We both feel it is paramount that he keeps his fistula as he will likely require dialysis in the near future.  After discussing the risks and benefits of fistulagram, Christian [name] elected to proceed.   Christian Sparrow [name]    History of Present Illness:  Patient is a 77 y.o. year old male who presents for examination of his left UE access. He has CKD  and the access was placed back in 7/22.  He was seen recently by Dr. Malen [name] his nephrologist.  She was concerned due to loss of thrill or weak thrill in the left UE AV fistula.  I don't have a report from Christian [name] about his kidney function, but the last BMET showed serum Creatinine of 3.08 mg/dl.  And GFR 19 ml/min.      He denies pain, weakness or loss of sensation.  He has no symptoms of steal.   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  LIMA TO LAD, RIMA TO PDA, RADIAL ARTERY TO CIRC AND SEQUENTIALLY TO OM1. 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--  eats small bites and drink liquids slowly   SOB (shortness of  breath) on exertion     Past Surgical History:  Procedure Laterality Date   APPLICATION OF WOUND VAC N/A 08/24/2019   Procedure: APPLICATION OF WOUND VAC;  Surgeon: Christian [name];  Location: [facility] OR;  Service: Thoracic;  Laterality: N/A;   APPLICATION OF WOUND VAC  08/29/2019   Procedure: Wound Vac change;  Surgeon: Christian [name];  Location: [facility] OR;  Service: Open Heart Surgery;;   APPLICATION OF WOUND VAC N/A 09/04/2019   Procedure: Application Of Wound Vac;  Surgeon: Christian Ovens, [name];  Location: [facility] OR;  Service: Open Heart Surgery;  Laterality: N/A;   APPLICATION OF WOUND VAC N/A 09/06/2019   Procedure: APPLICATION OF ACELL, APPLICATION OF WOUND VAC USING PREVENA INCISIONAL  DRESSING;  Surgeon: Christian Form, DO;  Location: [facility] OR;  Service: Plastics;  Laterality: N/A;   AV FISTULA PLACEMENT Left 09/18/2020   Procedure: ARTERIOVENOUS (AV) FISTULA CREATION LEFT VERSUS GRAFT;  Surgeon: Christian Libman, [name];  Location: [facility] OR;  Service: Vascular;  Laterality: Left;   BASCILIC VEIN TRANSPOSITION Left 10/31/2020   Procedure: LEFT SECOND STAGE BASCILIC VEIN TRANSPOSITION;  Surgeon: Christian Libman, [name];  Location: [facility] OR;  Service: Vascular;  Laterality: Left;   CARDIAC CATHETERIZATION  CORONARY ARTERY BYPASS GRAFT N/A 08/15/2019   Procedure: CORONARY ARTERY BYPASS GRAFTING (CABG), x 4 using bilateral IMAs and left radial artery .  LIMA TO LAD, RIMA TO PDA, RADIAL ARTERY TO CIRC AND SEQUENTIALLY TO OM1.;  Surgeon: Christian [name];  Location: [facility] OR;  Service: Open Heart Surgery;  Laterality: N/A;   CORONARY STENT PLACEMENT  02/27/2014   distal rt/pd coronary       dr Christian [name]   CYSTO/ BLADDER BIOPSY'S/ CAUTHERIZATION  01-14-2004  DR Christian [name]   EXPLORATION POST OPERATIVE OPEN HEART N/A 08/16/2019   Procedure: Chest Closure S?P CABG WITH APPLICATION OF PREVENA  INCISIONAL WOUND VAC;  Surgeon: Christian [name];  Location: [facility];  Service: Open Heart Surgery;  Laterality:  N/A;   EXPLORATION POST OPERATIVE OPEN HEART N/A 08/21/2019   Procedure: CHEST WASHOUT S/P OPEN CHEST;  Surgeon: Christian [name];  Location: [facility] OR;  Service: Open Heart Surgery;  Laterality: N/A;  Open chest with Esmark dressing with Ioban sealant coverage.   EXPLORATION POST OPERATIVE OPEN HEART N/A 08/18/2019   Procedure: EXPLORATION POST OPERATIVE OPEN HEART (performed 04/23 on unit);  Surgeon: Christian [name];  Location: [facility] OR;  Service: Open Heart Surgery;  Laterality: N/A;   EXPLORATION POST OPERATIVE OPEN HEART N/A 08/24/2019   Procedure: CHEST WASHOUT POST OPERATIVE OPEN HEART;  Surgeon: Christian [name];  [facility]: [facility];  Service: Open Heart Surgery;  Laterality: N/A;   EXPLORATION POST OPERATIVE OPEN HEART N/A 08/29/2019   Procedure: CHEST WOUND WASHOUT POST OPERATIVE OPEN HEART;  Surgeon: Christian [name];  [facility]: [facility];  Service: Open Heart Surgery;  Laterality: N/A;   EXPLORATION POST OPERATIVE OPEN HEART N/A 09/04/2019   Procedure: MEDIASTINAL EXPLORATION WITH STERNAL WOUND IRRIGATION;  Surgeon: Christian Ovens, [name];  Location: [facility] OR;  Service: Open Heart Surgery;  Laterality: N/A;   EXPLORATION POST OPERATIVE OPEN HEART N/A 09/14/2019   Procedure: EVACUATION OF HEMATOMA;  Surgeon: Christian Ovens, [name];  Location: [facility] OR;  Service: Open Heart Surgery;  Laterality: N/A;   IR FLUORO GUIDE CV LINE LEFT  09/19/2019   IR GASTROSTOMY TUBE MOD SED  10/04/2019   IR GASTROSTOMY TUBE REMOVAL  11/29/2019   IR PATIENT EVAL TECH 0-60 MINS  09/29/2019   IR US GUIDE VASC ACCESS LEFT  09/19/2019   LAPAROSCOPIC LYSIS OF ADHESIONS N/A 09/06/2019   Procedure: LAPAROSCOPIC OMENTAL HARVEST;  Surgeon: Christian Pickle, [name];  [facility]: [facility] OR;  Service: General;  Laterality: N/A;   LEFT HEART CATH AND CORONARY ANGIOGRAPHY N/A 08/10/2019   Procedure: LEFT HEART CATH AND CORONARY ANGIOGRAPHY;  Surgeon: Christian Negus, [name];  [facility] INVASIVE CV LAB;  Service: Cardiovascular;   Laterality: N/A;   LEFT HEART CATHETERIZATION WITH CORONARY ANGIOGRAM N/A 02/27/2014   Procedure: LEFT HEART CATHETERIZATION WITH CORONARY ANGIOGRAM;  Surgeon: Pamella Pert, [name];  Location: [facility] CATH LAB;  Service: Cardiovascular;  Laterality: N/A;   MEDIASTINAL EXPLORATION N/A 09/06/2019   Procedure: MEDIASTINAL EXPLORATION;  Surgeon: Christian Ovens, [name];  Location: [facility] OR;  Service: Thoracic;  Laterality: N/A;   PECTORALIS FLAP  09/06/2019   Procedure: Pectoralis ADVANCEMENT Flap;  Surgeon: Christian Form, DO;  Location: [facility] OR;  Service: Plastics;;   PERCUTANEOUS CORONARY STENT INTERVENTION (PCI-S)  02/27/2014   Procedure: PERCUTANEOUS CORONARY STENT INTERVENTION (PCI-S);  Surgeon: Pamella Pert, [name];  Location: [facility] CATH LAB;  Service: Cardiovascular;;  rt PDA  3.0/28mm Promus stent  RADIAL ARTERY HARVEST Left 08/15/2019   Procedure: Radial Artery Harvest;  Surgeon: Christian [name];  Location: [facility] OR;  Service: Open Heart Surgery;  Laterality: Left;   RIB PLATING N/A 09/06/2019   Procedure: STERNAL PLATING;  Surgeon: Christian Ovens, [name];  Location: [facility] OR;  Service: Thoracic;  Laterality: N/A;   TEE WITHOUT CARDIOVERSION N/A 08/15/2019   Procedure: TRANSESOPHAGEAL ECHOCARDIOGRAM (TEE);  Surgeon: Christian [name];  Location: [facility] OR;  Service: Open Heart Surgery;  Laterality: N/A;   TRACHEOSTOMY TUBE PLACEMENT  08/29/2019   Procedure: Tracheostomy;  Surgeon: Christian [name];  Location: [facility] OR;  Service: Open Heart Surgery; Decannulated 6/10/20211   TRANSURETHRAL RESECTION OF PROSTATE  04/04/2012   Procedure: TRANSURETHRAL RESECTION OF THE PROSTATE WITH GYRUS INSTRUMENTS;  Surgeon: Kathi Ludwig, [name];  Location: [facility];  Service: Urology;  Laterality: N/A;   TRANSURETHRAL RESECTION OF PROSTATE N/A 09/27/2014   Procedure: TRANSURETHRAL RESECTION OF THE PROSTATE ;  Surgeon: Jethro Bolus, [name];  Location: [facility] ORS;  Service: Urology;  Laterality: N/A;    UPPER GASTROINTESTINAL ENDOSCOPY       Social History Social History   Tobacco Use   Smoking status: Former    Packs/day: 0.50    Years: 20.00    Additional pack years: 0.00    Total pack years: 10.00    Types: Cigarettes    Quit date: 1975    Years since quitting: 49.3   Smokeless tobacco: Never  Vaping Use   Vaping Use: Never used  Substance Use Topics   Alcohol use: Not Currently    Comment: very rarely - every now and then 1 drink/1 year if that   Drug use: No    Family History Family History  Problem Relation Age of Onset   Diabetes Mother    Hypertension Mother    Diabetes Father    Diabetes Brother    Hypertension Brother    Bone cancer Brother    Diabetes Brother     Allergies  No Known Allergies   No current facility-administered medications for this encounter.    ROS:   General:  No weight loss, Fever, chills  HEENT: No recent headaches, no nasal bleeding, no visual changes, no sore throat  Neurologic: No dizziness, blackouts, seizures. No recent symptoms of stroke or mini- stroke. No recent episodes of slurred speech, or temporary blindness.  Cardiac: No recent episodes of chest pain/pressure, no shortness of breath at rest.  No shortness of breath with exertion.  Denies history of atrial fibrillation or irregular heartbeat  Vascular: No history of rest pain in feet.  No history of claudication.  No history of non-healing ulcer, No history of DVT   Pulmonary: No home oxygen, no productive cough, no hemoptysis,  No asthma or wheezing  Musculoskeletal:  [ ]  Arthritis, [ ]  Low back pain,  [ ]  Joint pain  Hematologic:No history of hypercoagulable state.  No history of easy bleeding.  No history of anemia  Gastrointestinal: No hematochezia or melena,  No gastroesophageal reflux, no trouble swallowing  Urinary: [ ]  chronic Kidney disease, [ ]  on HD - [ ]  MWF or [ ]  TTHS, [ ]  Burning with urination, [ ]  Frequent urination, [ ]  Difficulty urinating;    Skin: No rashes  Psychological: No history of anxiety,  No history of depression   Physical Examination  Vitals:   08/12/22 0722  BP: 132/76  Pulse: 74  Resp: 16  Temp: 97.7 F (  36.5 C)  TempSrc: Oral  SpO2: 98%  Weight: 96.2 kg  Height:  (1.753 m)    Body mass index is 31.31 kg/m.  General:  Alert and oriented, no acute distress HEENT: Normal Neck: No bruit or JVD Pulmonary: Clear to auscultation bilaterally Cardiac: Regular Rate and Rhythm without murmur Gastrointestinal: Soft, non-tender, non-distended, no mass, no scars Skin: No rash, no ischemic changes Extremity palpable weaker thrill in the left UE AV fistula, cap refill brisk, motor intact equal B UE. Cap refill brisk, Radial artery was harvested for CAGB.  Musculoskeletal: No deformity or edema  Neurologic: Upper and lower extremity motor 5/5 and symmetric  DATA:  Findings:  +--------------------+----------+-----------------+--------+  AVF                PSV (cm/s)Flow Vol (mL/min)Comments  +--------------------+----------+-----------------+--------+  Native artery inflow   104           444                 +--------------------+----------+-----------------+--------+  AVF Anastomosis         92                               +--------------------+----------+-----------------+--------+     +------------+------------+------------------+--------------+--------------  ----+  OUTFLOW VEIN PSV (cm/s)   Diameter (cm)     Depth (cm)       Describe        +------------+------------+------------------+--------------+--------------  ----+  Shoulder        28            0.50            0.24                          +------------+------------+------------------+--------------+--------------  ----+  Prox UA          76            0.45            0.40                          +------------+------------+------------------+--------------+--------------  ----+  Mid UA        456 / 54 / 0.23 / 0.61 / 0.610.27 / 0.28 /   prox to mid  ua                    104                            0.28                          +------------+------------+------------------+--------------+--------------  ----+  Dist UA         461            0.29            1.30     change in  Diameter                                                         and audible  bruit   +------------+------------+------------------+--------------+--------------  ----+  AC Fossa         50            0.83            0.94                          +------------+------------+------------------+--------------+--------------  ----+      Summary:  Patent Basilic vein transposition with arteriovenous fistula-Velocities  less  than 100cm/s noted.  Two segments of arteriovenous graft-Stenosis noted.    ASSESSMENT/PLAN:  CKD with Basilic fistula creation 10/31/20.  CKD followed by Dr. Malen [name] Nephrologist recommend he f/u when she noted decreased thrill in the fistula on exam a few weeks ago.  He is currently not on HD.   The fistulogram demonstrates areas of stenosis with a velocity of 461 cm/s.  The flow volume has declined as well now at 444 ml/min.     I will schedule him for fistulogram  in the next few weeks to try and prevent ESRD.  He and his wife agree to proceed with this plan.  He may need ultrasound guidance and a small amount of contrast.  This was discussed with Dr. Karin Lieu.     He is managed medically on Plavix, ASA and daily Statin.       Christian Sparrow PA-C [facility] [place] Office: [phone]  [name] in clinic [place]

## 2022-08-12 NOTE — Op Note (Signed)
    Patient name: Christian [name] MRN: [id number] DOB: [birth year] Sex: male  08/12/2022 Pre-operative Diagnosis: Fistula malfunction Post-operative diagnosis:  Same Surgeon:  [name], MD Procedure Performed: 1.  Ultrasound-guided micropuncture access of the left arm brachiobasilic fistula x2 2.  Fistulogram 3.  Drug-coated balloon angioplasty of the midportion of the fistula 5 x 40 mm 4.  Balloon angioplasty of the peripheral portion of the fistula using a 5 x 40 mm, with subsequent 6 x 5 mm Viabahn stenting   Indications: Patient is a 77 year old male with previous history of left-sided brachiobasilic fistula creation.  This fistula is low flow, and was found to be pulsatile on recent exam.  After discussing the risk and benefits of fistulogram in an effort to define and improve flow so the fistula may be used for dialysis, Christian [name] elected to proceed.  Findings:  2 tandem lesions of greater than 90% stenosis in the mid to proximal portion of the brachiobasilic fistula.  No central stenosis appreciated Widely patent anastomosis.   Procedure:  The patient was identified in the holding area and taken to room 8.  The patient was then placed supine on the table and prepped and draped in the usual sterile fashion.  A time out was called.  Ultrasound was used to evaluate the left arm fistula.  An ultrasound-guided micropuncture needle was used to access the fistula.  Fistulogram followed.  I elected to intervene on the 2, tandem lesions. The patient was heparinized with 5000's IV heparin.There were 2 lesions, 1 immediately at the access site, and the other slightly more central.   I elected to intervene on the more central lesion first.  A 4 x 40 mm balloon was inserted, and inflated.  Follow-up angiography demonstrated improvement, however there was rebound with residual stenosis.  Therefore, I moved to a 5 x 40 mm coated balloon.  This was inflated for 3 minutes with significant improvement  to less than 30% stenosis.  Next, I moved to the stenotic lesion immediately at the access site.  I initially tried to balloon angioplasty the lesion, however the sheath kicked out of the venotomy.  This caused a small hematoma, however the sheath was reinserted without issue.  I elected to make a counter venotomy on the upper portion of the arm and directed wires downward across the lesion into the native brachial artery.  Being that the lesion was greater than 90%, and at the access site, I did not feel comfortable doing a simple balloon angioplasty as I felt that this would rip the vein.  I elected to take a 6 mm x 5 cm Gore Viabahn stent across the lesion which was postdilated with a 6 mm balloon.  There was no residual stenosis at this lesion.  The first access sheath was removed with no bleeding due to coverage by the stent.  At this point, there was a palpable thrill within the fistula.  I elected to terminate the case.    Impression: Successful drug-coated balloon angioplasty of the midportion of the fistula to residual stenosis less than 30%.  Successful stenting 6x5cm viabahn of the proximal portion of the fistula with resolution of flow-limiting stenosis.   Christian Olden, MD [facility] [place] Office: [phone]

## 2022-08-12 NOTE — H&P (Signed)
[facility] [place] HISTORY AND PHYSICAL   Patient seen and examined in preop holding.  No complaints. No changes to medication history or physical exam since last seen in clinic. I discussed Christian [name] with Dr. Malen Gauze, his nephrologist, specifically whether he would benefit from fistulogram and weighing the risks and benefits of contrast use in the setting of CKD.  We both feel it is paramount that he keeps his fistula as he will likely require dialysis in the near future.  After discussing the risks and benefits of fistulagram, Christian [name] elected to proceed.   Christian Sparrow MD    History of Present Illness:  Patient is a 77 y.o. year old male who presents for examination of his left UE access. He has CKD  and the access was placed back in 7/22.  He was seen recently by Dr. Malen Gauze his nephrologist.  She was concerned due to loss of thrill or weak thrill in the left UE AV fistula.  I don't have a report from DR. Foster about his kidney function, but the last BMET showed serum Creatinine of 3.08 mg/dl.  And GFR 19 ml/min.      He denies pain, weakness or loss of sensation.  He has no symptoms of steal.   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  LIMA TO LAD, RIMA TO PDA, RADIAL ARTERY TO CIRC AND SEQUENTIALLY TO OM1. 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--  eats small bites and drink liquids slowly   SOB (shortness of  breath) on exertion     Past Surgical History:  Procedure Laterality Date   APPLICATION OF WOUND VAC N/A 08/24/2019   Procedure: APPLICATION OF WOUND VAC;  Surgeon: Linden Dolin, MD;  Location: [facility] OR;  Service: Thoracic;  Laterality: N/A;   APPLICATION OF WOUND VAC  08/29/2019   Procedure: Wound Vac change;  Surgeon: Linden Dolin, MD;  Location: [facility] OR;  Service: Open Heart Surgery;;   APPLICATION OF WOUND VAC N/A 09/04/2019   Procedure: Application Of Wound Vac;  Surgeon: Delight Ovens, MD;  Location: [facility] OR;  Service: Open Heart Surgery;  Laterality: N/A;   APPLICATION OF WOUND VAC N/A 09/06/2019   Procedure: APPLICATION OF ACELL, APPLICATION OF WOUND VAC USING PREVENA INCISIONAL  DRESSING;  Surgeon: Peggye Form, DO;  Location: [facility] OR;  Service: Plastics;  Laterality: N/A;   AV FISTULA PLACEMENT Left 09/18/2020   Procedure: ARTERIOVENOUS (AV) FISTULA CREATION LEFT VERSUS GRAFT;  Surgeon: Nada Libman, MD;  Location: [facility] OR;  Service: Vascular;  Laterality: Left;   BASCILIC VEIN TRANSPOSITION Left 10/31/2020   Procedure: LEFT SECOND STAGE BASCILIC VEIN TRANSPOSITION;  Surgeon: Nada Libman, MD;  Location: [facility];  Service: Vascular;  Laterality: Left;   CARDIAC CATHETERIZATION  CORONARY ARTERY BYPASS GRAFT N/A 08/15/2019   Procedure: CORONARY ARTERY BYPASS GRAFTING (CABG), x 4 using bilateral IMAs and left radial artery .  LIMA TO LAD, RIMA TO PDA, RADIAL ARTERY TO CIRC AND SEQUENTIALLY TO OM1.;  Surgeon: Linden Dolin, MD;  Location: [facility];  Service: Open Heart Surgery;  Laterality: N/A;   CORONARY STENT PLACEMENT  02/27/2014   distal rt/pd coronary       dr Jacinto Halim   CYSTO/ BLADDER BIOPSY'S/ CAUTHERIZATION  01-14-2004  DR Patsi Sears   EXPLORATION POST OPERATIVE OPEN HEART N/A 08/16/2019   Procedure: Chest Closure S?P CABG WITH APPLICATION OF PREVENA  INCISIONAL WOUND VAC;  Surgeon: Linden Dolin, MD;  Location: [facility];  Service: Open Heart Surgery;  Laterality:  N/A;   EXPLORATION POST OPERATIVE OPEN HEART N/A 08/21/2019   Procedure: CHEST WASHOUT S/P OPEN CHEST;  Surgeon: Linden Dolin, MD;  [facility]: [facility];  Service: Open Heart Surgery;  Laterality: N/A;  Open chest with Esmark dressing with Ioban sealant coverage.   EXPLORATION POST OPERATIVE OPEN HEART N/A 08/18/2019   Procedure: EXPLORATION POST OPERATIVE OPEN HEART (performed 04/23 on unit);  Surgeon: Linden Dolin, MD;  Location: [facility] OR;  Service: Open Heart Surgery;  Laterality: N/A;   EXPLORATION POST OPERATIVE OPEN HEART N/A 08/24/2019   Procedure: CHEST WASHOUT POST OPERATIVE OPEN HEART;  Surgeon: Linden Dolin, MD;  [facility];  Service: Open Heart Surgery;  Laterality: N/A;   EXPLORATION POST OPERATIVE OPEN HEART N/A 08/29/2019   Procedure: CHEST WOUND WASHOUT POST OPERATIVE OPEN HEART;  Surgeon: Linden Dolin, MD;  [facility];  Service: Open Heart Surgery;  Laterality: N/A;   EXPLORATION POST OPERATIVE OPEN HEART N/A 09/04/2019   Procedure: MEDIASTINAL EXPLORATION WITH STERNAL WOUND IRRIGATION;  Surgeon: Delight Ovens, MD;  Location: [facility] OR;  Service: Open Heart Surgery;  Laterality: N/A;   EXPLORATION POST OPERATIVE OPEN HEART N/A 09/14/2019   Procedure: EVACUATION OF HEMATOMA;  Surgeon: Delight Ovens, MD;  Location: [facility] OR;  Service: Open Heart Surgery;  Laterality: N/A;   IR FLUORO GUIDE CV LINE LEFT  09/19/2019   IR GASTROSTOMY TUBE MOD SED  10/04/2019   IR GASTROSTOMY TUBE REMOVAL  11/29/2019   IR PATIENT EVAL TECH 0-60 MINS  09/29/2019   IR US GUIDE VASC ACCESS LEFT  09/19/2019   LAPAROSCOPIC LYSIS OF ADHESIONS N/A 09/06/2019   Procedure: LAPAROSCOPIC OMENTAL HARVEST;  Surgeon: Rodman Pickle, MD;  [facility];  Service: General;  Laterality: N/A;   LEFT HEART CATH AND CORONARY ANGIOGRAPHY N/A 08/10/2019   Procedure: LEFT HEART CATH AND CORONARY ANGIOGRAPHY;  Surgeon: Elder Negus, MD;  [facility] INV[facility];  Service: Cardiovascular;   Laterality: N/A;   LEFT HEART CATHETERIZATION WITH CORONARY ANGIOGRAM N/A 02/27/2014   Procedure: LEFT HEART CATHETERIZATION WITH CORONARY ANGIOGRAM;  Surgeon: Pamella Pert, MD;  Location: [facility] CATH LAB;  Service: Cardiovascular;  Laterality: N/A;   MEDIASTINAL EXPLORATION N/A 09/06/2019   Procedure: MEDIASTINAL EXPLORATION;  Surgeon: Delight Ovens, MD;  Location: [facility] OR;  Service: Thoracic;  Laterality: N/A;   PECTORALIS FLAP  09/06/2019   Procedure: Pectoralis ADVANCEMENT Flap;  Surgeon: Peggye Form, DO;  [facility]: [facility] OR;  Service: Plastics;;   PERCUTANEOUS CORONARY STENT INTERVENTION (PCI-S)  02/27/2014   Procedure: PERCUTANEOUS CORONARY STENT INTERVENTION (PCI-S);  Surgeon: Pamella Pert, MD;  Location: [facility] CATH LAB;  Service: Cardiovascular;;  rt PDA  3.0/28mm Promus stent  RADIAL ARTERY HARVEST Left 08/15/2019   Procedure: Radial Artery Harvest;  Surgeon: Linden Dolin, MD;  Location: [facility] OR;  Service: Open Heart Surgery;  Laterality: Left;   RIB PLATING N/A 09/06/2019   Procedure: STERNAL PLATING;  Surgeon: Delight Ovens, MD;  Location: [facility] OR;  Service: Thoracic;  Laterality: N/A;   TEE WITHOUT CARDIOVERSION N/A 08/15/2019   Procedure: TRANSESOPHAGEAL ECHOCARDIOGRAM (TEE);  Surgeon: Linden Dolin, MD;  Location: [facility] OR;  Service: Open Heart Surgery;  Laterality: N/A;   TRACHEOSTOMY TUBE PLACEMENT  08/29/2019   Procedure: Tracheostomy;  Surgeon: Linden Dolin, MD;  Location: [facility] OR;  Service: Open Heart Surgery; Decannulated 6/10/20211   TRANSURETHRAL RESECTION OF PROSTATE  04/04/2012   Procedure: TRANSURETHRAL RESECTION OF THE PROSTATE WITH GYRUS INSTRUMENTS;  Surgeon: Kathi Ludwig, MD;  Location: [facility];  Service: Urology;  Laterality: N/A;   TRANSURETHRAL RESECTION OF PROSTATE N/A 09/27/2014   Procedure: TRANSURETHRAL RESECTION OF THE PROSTATE ;  Surgeon: Jethro Bolus, MD;  Location: [facility] ORS;  Service: Urology;  Laterality: N/A;    UPPER GASTROINTESTINAL ENDOSCOPY       Social History Social History   Tobacco Use   Smoking status: Former    Packs/day: 0.50    Years: 20.00    Additional pack years: 0.00    Total pack years: 10.00    Types: Cigarettes    Quit date: 1975    Years since quitting: 49.3   Smokeless tobacco: Never  Vaping Use   Vaping Use: Never used  Substance Use Topics   Alcohol use: Not Currently    Comment: very rarely - every now and then 1 drink/1 year if that   Drug use: No    Family History Family History  Problem Relation Age of Onset   Diabetes Mother    Hypertension Mother    Diabetes Father    Diabetes Brother    Hypertension Brother    Bone cancer Brother    Diabetes Brother     Allergies  No Known Allergies   No current facility-administered medications for this encounter.   Current Outpatient Medications  Medication Sig Dispense Refill   albuterol (VENTOLIN HFA) 108 (90 Base) MCG/ACT inhaler TAKE 2 PUFFS BY MOUTH EVERY 6 HOURS AS NEEDED FOR WHEEZE OR SHORTNESS OF BREATH 8.5 each 3   aspirin EC 81 MG tablet Take 81 mg by mouth daily.     atorvastatin (LIPITOR) 20 MG tablet TAKE 1 TABLET BY MOUTH EVERY DAY IN THE EVENING 90 tablet 3   budesonide-formoterol (SYMBICORT) 160-4.5 MCG/ACT inhaler INHALE 2 PUFFS INTO THE LUNGS TWICE A DAY 10.2 g 6   clonazePAM (KLONOPIN) 0.5 MG disintegrating tablet TAKE 1 TABLET BY MOUTH TWICE A DAY 60 tablet 5   clopidogrel (PLAVIX) 75 MG tablet TAKE 1 TABLET BY MOUTH EVERY DAY 90 tablet 2   [employer] ACETAMINOPHEN 325 MG tablet TAKE 2 CAPS BY MOUTH EVERY 6 HOURS AS NEEDED FOR PAIN FOR TEMP 100F OR ABOVE (Patient taking differently: Take 325 mg by mouth every 6 (six) hours as needed for mild pain or headache (temp over 100).) 60 tablet 0   [employer] SENNA PLUS 8.6-50 MG tablet TAKE 2 TABLETS BY MOUTH AT BEDTIME FOR CONSTIPATION 60 tablet 1   ferrous sulfate 325 (65 FE) MG tablet Take 325 mg by mouth daily with breakfast.     fluticasone (FLONASE)  50 MCG/ACT nasal spray SPRAY 1 SPRAY INTO EACH NOSTRIL EVERY DAY 48 mL 3  furosemide (LASIX) 80 MG tablet Take 120 mg by mouth 2 (two) times daily.     hydrALAZINE (APRESOLINE) 100 MG tablet Take 1 tablet (100 mg total) by mouth 3 (three) times daily. 270 tablet 3   isosorbide dinitrate (ISORDIL) 30 MG tablet TAKE 1 TABLET BY MOUTH THREE TIMES A DAY 270 tablet 1   metolazone (ZAROXOLYN) 5 MG tablet Take 5 mg by mouth 2 (two) times a week.     metoprolol succinate (TOPROL-XL) 50 MG 24 hr tablet TAKE 3 TABLETS (150 MG TOTAL) BY MOUTH DAILY. TAKE WITH OR IMMEDIATELY FOLLOWING A MEAL. 270 tablet 1   Multiple Vitamin (MULTIVITAMIN) tablet Take 1 tablet by mouth daily.     potassium chloride (KLOR-CON) 10 MEQ tablet Take 20 mEq by mouth daily.     sertraline (ZOLOFT) 25 MG tablet TAKE 1 TABLET BY MOUTH EVERY DAY 90 tablet 3   silodosin (RAPAFLO) 8 MG CAPS capsule Take 1 capsule (8 mg total) by mouth at bedtime. 90 capsule 3   Vitamin D, Ergocalciferol, (DRISDOL) 1.25 MG (50000 UNIT) CAPS capsule TAKE 1 CAPSULE (50,000 UNITS TOTAL) BY MOUTH EVERY 7 (SEVEN) DAYS 12 capsule 3   cromolyn (OPTICROM) 4 % ophthalmic solution Place 1 drop into both eyes every Wednesday. At bedtime     nitroGLYCERIN (NITROSTAT) 0.4 MG SL tablet Place 0.4 mg under the tongue every 5 (five) minutes as needed for chest pain.     PREVIDENT 5000 BOOSTER PLUS 1.1 % PSTE Take 1 application  by mouth every evening. 100 mL 6    ROS:   General:  No weight loss, Fever, chills  HEENT: No recent headaches, no nasal bleeding, no visual changes, no sore throat  Neurologic: No dizziness, blackouts, seizures. No recent symptoms of stroke or mini- stroke. No recent episodes of slurred speech, or temporary blindness.  Cardiac: No recent episodes of chest pain/pressure, no shortness of breath at rest.  No shortness of breath with exertion.  Denies history of atrial fibrillation or irregular heartbeat  Vascular: No history of rest pain in  feet.  No history of claudication.  No history of non-healing ulcer, No history of DVT   Pulmonary: No home oxygen, no productive cough, no hemoptysis,  No asthma or wheezing  Musculoskeletal:  [ ]  Arthritis, [ ]  Low back pain,  [ ]  Joint pain  Hematologic:No history of hypercoagulable state.  No history of easy bleeding.  No history of anemia  Gastrointestinal: No hematochezia or melena,  No gastroesophageal reflux, no trouble swallowing  Urinary: [ ]  chronic Kidney disease, [ ]  on HD - [ ]  MWF or [ ]  TTHS, [ ]  Burning with urination, [ ]  Frequent urination, [ ]  Difficulty urinating;   Skin: No rashes  Psychological: No history of anxiety,  No history of depression   Physical Examination  Vitals:   08/12/22 1100 08/12/22 1115 08/12/22 1130 08/12/22 1145  BP: (!) 143/79 (!) 152/74 (!) 149/69 116/81  Pulse: 82 79 82 88  Resp: 17  16 18   Temp:      TempSrc:      SpO2: 98% 96% 95% 97%  Weight:      Height:        Body mass index is 31.31 kg/m.  General:  Alert and oriented, no acute distress HEENT: Normal Neck: No bruit or JVD Pulmonary: Clear to auscultation bilaterally Cardiac: Regular Rate and Rhythm without murmur Gastrointestinal: Soft, non-tender, non-distended, no mass, no scars Skin: No rash, no ischemic changes Extremity  palpable weaker thrill in the left UE AV fistula, cap refill brisk, motor intact equal B UE. Cap refill brisk, Radial artery was harvested for CAGB.  Musculoskeletal: No deformity or edema  Neurologic: Upper and lower extremity motor 5/5 and symmetric  DATA:  Findings:  +--------------------+----------+-----------------+--------+  AVF                PSV (cm/s)Flow Vol (mL/min)Comments  +--------------------+----------+-----------------+--------+  Native artery inflow   104           444                 +--------------------+----------+-----------------+--------+  AVF Anastomosis         92                                +--------------------+----------+-----------------+--------+     +------------+------------+------------------+--------------+--------------  ----+  OUTFLOW VEIN PSV (cm/s)   Diameter (cm)     Depth (cm)       Describe        +------------+------------+------------------+--------------+--------------  ----+  Shoulder        28            0.50            0.24                          +------------+------------+------------------+--------------+--------------  ----+  Prox UA          76            0.45            0.40                          +------------+------------+------------------+--------------+--------------  ----+  Mid UA       456 / 54 / 0.23 / 0.61 / 0.610.27 / 0.28 /   prox to mid  ua                    104                            0.28                          +------------+------------+------------------+--------------+--------------  ----+  Dist UA         461            0.29            1.30     change in  Diameter                                                         and audible  bruit   +------------+------------+------------------+--------------+--------------  ----+  AC Fossa         50            0.83            0.94                          +------------+------------+------------------+--------------+--------------  ----+      Summary:  Patent Basilic vein transposition with arteriovenous fistula-Velocities  less  than 100cm/s noted.  Two segments of arteriovenous graft-Stenosis noted.    ASSESSMENT/PLAN:  CKD with Basilic fistula creation 10/31/20.  CKD followed by Dr. Malen Gauze Nephrologist recommend he f/u when she noted decreased thrill in the fistula on exam a few weeks ago.  He is currently not on HD.   The fistulogram demonstrates areas of stenosis with a velocity of 461 cm/s.  The flow volume has declined as well now at 444 ml/min.     I will schedule him for fistulogram  in the next few  weeks to try and prevent ESRD.  He and his wife agree to proceed with this plan.  He may need ultrasound guidance and a small amount of contrast.  This was discussed with Dr. Karin Lieu.     He is managed medically on Plavix, ASA and daily Statin.       Christian Sparrow PA-C [facility] [place] Office: [phone]  MD in clinic [place]

## 2022-08-13 ENCOUNTER — Encounter: Payer: Self-pay | Admitting: Physician Assistant

## 2022-08-13 ENCOUNTER — Ambulatory Visit (INDEPENDENT_AMBULATORY_CARE_PROVIDER_SITE_OTHER): Payer: Medicare HMO | Admitting: Physician Assistant

## 2022-08-13 VITALS — BP 123/84 | HR 78 | Resp 20 | Ht 69.0 in

## 2022-08-13 DIAGNOSIS — R413 Other amnesia: Secondary | ICD-10-CM

## 2022-08-13 NOTE — Progress Notes (Addendum)
Assessment/Plan:   Christian [name] is a very pleasant 77 y.o. 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 MoCA today is 15/30. Discussed with the patient the necessary, basic testing for diagnosis of memory impairment. Patient adamantly does not want to "be told what to do, and unless I have dementia I will not take a pill". He declines any invasive procedures (I.e LP, etc) . Neuropsych evaluation is recommended given his history of anxiety, vascular disease which may contribute to his memory issues. He agrees to have this testing, understanding that may take several months to have this performed, and does not wish to return unless this testing has been performed.     Memory Impairment of unclear etiology   Neurocognitive testing to further evaluate cognitive concerns and determine other underlying cause of memory changes, including potential contribution from sleep, anxiety, or depression  Continue B12 replenishment Patient declines antidementia medication    Folllow up  after Neuropsych testing as per patient's decision   Subjective:   The patient is accompanied by his wife who supplements the history.   How long did patient have memory difficulties?"  I don't know why I am here, except I cannot remember my chess movements since I got of the hospital in 2021". Patient denies any worsening difficulty remembering recent conversations and people names". 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  repeats oneself?  Endorsed by his wife "all the time, even before 2021" Disoriented when walking into a room?  Patient denies  Leaving objects in unusual  places? Denies any changes, "I remember where my stuff is"  Wandering behavior?  Denies. I don't walk outside to prevent falls"    Any personality changes since last visit?  Patient denies   Any history of depression?:  My personality has changed, I am nicer than before" Hallucinations or paranoia?  Patient denies   Seizures?   Patient denies    Any sleep changes? Sleeps well, denies vivid dreams, REM behavior or sleepwalking.   Sleep apnea?  Patient denies   Any hygiene concerns?  Patient denies   Independent of bathing and dressing?  Endorsed  Does the patient needs help with medications? Wife is in charge   Who is in charge of the finances? Wife  is in charge     Any changes in appetite? "I want to lose weight so I watch diet" Patient have trouble swallowing? Chronic, since 2021. Had a swallow study at [facility] back then for dysfunctional valve. Had ST in the past  Does the patient cook?   Any kitchen accidents such as leaving the stove on? denies   Any headaches?   Not recently  Chronic back pain ? Endorsed, trying to lose weight "I feel the weight hast to do with it".  Ambulates with difficulty?  Uses with a cane for stability  Recent falls or head injuries?" I fell after a doctor's visit", mechanical. No LOC, mild bruise on the L eyelid Vision changes? Endorsed, has an appt with her eye doctor this week, may need new glasses Unilateral weakness, numbness or tingling? Residual very mild left-sided weakness  Any tremors? "  Always had shakes, I was born nervous and evil" Any anosmia?  denies   Any incontinence of urine?  He has a history of incomplete bladder emptying on [place] Any bowel dysfunction? "Sometimes when I make urine it some lose stool may come" Patient lives with wife     History of heavy alcohol intake? denies   History of heavy tobacco use?  denies   Family history of dementia? denies  Does patient drive?  No longer drives   Retired Production designer, theatre/television/film at [employer]    CT  head 07/2022 No evidence of acute intracranial abnormality.2. No evidence of acute fracture or traumatic malalignment.3. 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 MRI could further characterize if clinically warranted.  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  LIMA TO LAD, RIMA TO PDA, RADIAL ARTERY TO CIRC AND SEQUENTIALLY TO OM1. 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--  eats small bites and drink liquids slowly   SOB (shortness of breath) on exertion      Past Surgical History:  Procedure Laterality Date   APPLICATION OF WOUND VAC N/A 08/24/2019   Procedure: APPLICATION OF WOUND VAC;  Surgeon: Linden Dolin, MD;  Location: [facility] OR;  Service: Thoracic;  Laterality: N/A;   APPLICATION OF WOUND VAC  08/29/2019   Procedure: Wound Vac change;  Surgeon: Linden Dolin, MD;  Location: [facility] OR;  Service: Open Heart Surgery;;   APPLICATION OF WOUND VAC N/A 09/04/2019   Procedure: Application Of Wound Vac;  Surgeon: Delight Ovens, MD;  Location: [facility] OR;  Service: Open Heart Surgery;  Laterality: N/A;   APPLICATION OF WOUND VAC N/A 09/06/2019   Procedure: APPLICATION OF ACELL, APPLICATION OF WOUND VAC USING PREVENA INCISIONAL  DRESSING;  Surgeon: Peggye Form, DO;  Location: [facility] OR;  Service: Plastics;  Laterality: N/A;   AV FISTULA PLACEMENT Left 09/18/2020   Procedure: ARTERIOVENOUS (AV) FISTULA CREATION LEFT VERSUS  GRAFT;  Surgeon: Nada Libman, MD;  Location: [facility] OR;  Service: Vascular;  Laterality: Left;   BASCILIC VEIN TRANSPOSITION Left 10/31/2020   Procedure: LEFT SECOND STAGE BASCILIC VEIN TRANSPOSITION;  Surgeon: Nada Libman, MD;  [facility]: [facility];  Service: Vascular;  Laterality: Left;   CARDIAC CATHETERIZATION     CORONARY ARTERY BYPASS GRAFT N/A 08/15/2019   Procedure: CORONARY ARTERY BYPASS GRAFTING (CABG), x 4 using bilateral IMAs and left radial artery .  LIMA TO LAD, RIMA TO PDA, RADIAL ARTERY TO CIRC AND SEQUENTIALLY TO OM1.;  Surgeon: Linden Dolin, MD;  Location: [facility];  Service: Open Heart Surgery;  Laterality: N/A;   CORONARY STENT PLACEMENT  02/27/2014   distal rt/pd coronary       dr Jacinto Halim   CYSTO/ BLADDER BIOPSY'S/ CAUTHERIZATION  01-14-2004  DR Patsi Sears  EXPLORATION POST OPERATIVE OPEN HEART N/A 08/16/2019   Procedure: Chest Closure S?P CABG WITH APPLICATION OF PREVENA  INCISIONAL WOUND VAC;  Surgeon: Linden Dolin, MD;  [facility];  Service: Open Heart Surgery;  Laterality: N/A;   EXPLORATION POST OPERATIVE OPEN HEART N/A 08/21/2019   Procedure: CHEST WASHOUT S/P OPEN CHEST;  Surgeon: Linden Dolin, MD;  [facility];  Service: Open Heart Surgery;  Laterality: N/A;  Open chest with Esmark dressing with Ioban sealant coverage.   EXPLORATION POST OPERATIVE OPEN HEART N/A 08/18/2019   Procedure: EXPLORATION POST OPERATIVE OPEN HEART (performed 04/23 on unit);  Surgeon: Linden Dolin, MD;  Location: [facility] OR;  Service: Open Heart Surgery;  Laterality: N/A;   EXPLORATION POST OPERATIVE OPEN HEART N/A 08/24/2019   Procedure: CHEST WASHOUT POST OPERATIVE OPEN HEART;  Surgeon: Linden Dolin, MD;  [facility];  Service: Open Heart Surgery;  Laterality: N/A;   EXPLORATION POST OPERATIVE OPEN HEART N/A 08/29/2019   Procedure: CHEST WOUND WASHOUT POST OPERATIVE OPEN HEART;  Surgeon: Linden Dolin, MD;  [facility];  Service: Open Heart Surgery;  Laterality:  N/A;   EXPLORATION POST OPERATIVE OPEN HEART N/A 09/04/2019   Procedure: MEDIASTINAL EXPLORATION WITH STERNAL WOUND IRRIGATION;  Surgeon: Delight Ovens, MD;  Location: [facility] OR;  Service: Open Heart Surgery;  Laterality: N/A;   EXPLORATION POST OPERATIVE OPEN HEART N/A 09/14/2019   Procedure: EVACUATION OF HEMATOMA;  Surgeon: Delight Ovens, MD;  Location: [facility] OR;  Service: Open Heart Surgery;  Laterality: N/A;   IR FLUORO GUIDE CV LINE LEFT  09/19/2019   IR GASTROSTOMY TUBE MOD SED  10/04/2019   IR GASTROSTOMY TUBE REMOVAL  11/29/2019   IR PATIENT EVAL TECH 0-60 MINS  09/29/2019   IR US GUIDE VASC ACCESS LEFT  09/19/2019   LAPAROSCOPIC LYSIS OF ADHESIONS N/A 09/06/2019   Procedure: LAPAROSCOPIC OMENTAL HARVEST;  Surgeon: Rodman Pickle, MD;  [facility];  Service: General;  Laterality: N/A;   LEFT HEART CATH AND CORONARY ANGIOGRAPHY N/A 08/10/2019   Procedure: LEFT HEART CATH AND CORONARY ANGIOGRAPHY;  Surgeon: Elder Negus, MD;  [facility] INV[facility];  Service: Cardiovascular;  Laterality: N/A;   LEFT HEART CATHETERIZATION WITH CORONARY ANGIOGRAM N/A 02/27/2014   Procedure: LEFT HEART CATHETERIZATION WITH CORONARY ANGIOGRAM;  Surgeon: Pamella Pert, MD;  Location: [facility] CATH LAB;  Service: Cardiovascular;  Laterality: N/A;   MEDIASTINAL EXPLORATION N/A 09/06/2019   Procedure: MEDIASTINAL EXPLORATION;  Surgeon: Delight Ovens, MD;  Location: [facility] OR;  Service: Thoracic;  Laterality: N/A;   PECTORALIS FLAP  09/06/2019   Procedure: Pectoralis ADVANCEMENT Flap;  Surgeon: Peggye Form, DO;  [facility];  Service: Plastics;;   PERCUTANEOUS CORONARY STENT INTERVENTION (PCI-S)  02/27/2014   Procedure: PERCUTANEOUS CORONARY STENT INTERVENTION (PCI-S);  Surgeon: Pamella Pert, MD;  Location: [facility] CATH LAB;  Service: Cardiovascular;;  rt PDA  3.0/28mm Promus stent   RADIAL ARTERY HARVEST Left 08/15/2019   Procedure: Radial Artery Harvest;  Surgeon: Linden Dolin, MD;   [facility];  Service: Open Heart Surgery;  Laterality: Left;   RIB PLATING N/A 09/06/2019   Procedure: STERNAL PLATING;  Surgeon: Delight Ovens, MD;  Location: [facility] OR;  Service: Thoracic;  Laterality: N/A;   TEE WITHOUT CARDIOVERSION N/A 08/15/2019   Procedure: TRANSESOPHAGEAL ECHOCARDIOGRAM (TEE);  Surgeon: Linden Dolin, MD;  Location: [facility] OR;  Service: Open Heart Surgery;  Laterality: N/A;   TRACHEOSTOMY  TUBE PLACEMENT  08/29/2019   Procedure: Tracheostomy;  Surgeon: Linden Dolin, MD;  Location: [facility] OR;  Service: Open Heart Surgery; Decannulated 6/10/20211   TRANSURETHRAL RESECTION OF PROSTATE  04/04/2012   Procedure: TRANSURETHRAL RESECTION OF THE PROSTATE WITH GYRUS INSTRUMENTS;  Surgeon: Kathi Ludwig, MD;  [facility];  Service: Urology;  Laterality: N/A;   TRANSURETHRAL RESECTION OF PROSTATE N/A 09/27/2014   Procedure: TRANSURETHRAL RESECTION OF THE PROSTATE ;  Surgeon: Jethro Bolus, MD;  Location: [facility] ORS;  Service: Urology;  Laterality: N/A;   UPPER GASTROINTESTINAL ENDOSCOPY       No Known Allergies  Current Outpatient Medications  Medication Instructions   albuterol (VENTOLIN HFA) 108 (90 Base) MCG/ACT inhaler TAKE 2 PUFFS BY MOUTH EVERY 6 HOURS AS NEEDED FOR WHEEZE OR SHORTNESS OF BREATH   aspirin EC 81 mg, Oral, Daily   atorvastatin (LIPITOR) 20 MG tablet TAKE 1 TABLET BY MOUTH EVERY DAY IN THE EVENING   budesonide-formoterol (SYMBICORT) 160-4.5 MCG/ACT inhaler INHALE 2 PUFFS INTO THE LUNGS TWICE A DAY   clonazePAM (KLONOPIN) 0.5 MG disintegrating tablet TAKE 1 TABLET BY MOUTH TWICE A DAY   clopidogrel (PLAVIX) 75 MG tablet TAKE 1 TABLET BY MOUTH EVERY DAY   cromolyn (OPTICROM) 4 % ophthalmic solution 1 drop, Both Eyes, Every Wed, At bedtime   [facility] ACETAMINOPHEN 325 MG tablet TAKE 2 CAPS BY MOUTH EVERY 6 HOURS AS NEEDED FOR PAIN FOR TEMP 100F OR ABOVE   [facility] SENNA PLUS 8.6-50 MG tablet TAKE 2 TABLETS BY MOUTH AT BEDTIME FOR  CONSTIPATION   ferrous sulfate 325 mg, Oral, Daily with breakfast   fluticasone (FLONASE) 50 MCG/ACT nasal spray SPRAY 1 SPRAY INTO EACH NOSTRIL EVERY DAY   furosemide (LASIX) 120 mg, Oral, 2 times daily   hydrALAZINE (APRESOLINE) 100 mg, Oral, 3 times daily   isosorbide dinitrate (ISORDIL) 30 MG tablet TAKE 1 TABLET BY MOUTH THREE TIMES A DAY   metolazone (ZAROXOLYN) 5 mg, Oral, 2 times weekly   metoprolol succinate (TOPROL-XL) 150 mg, Oral, Daily, Take with or immediately following a meal.   Multiple Vitamin (MULTIVITAMIN) tablet 1 tablet, Oral, Daily   nitroGLYCERIN (NITROSTAT) 0.4 mg, Sublingual, Every 5 min PRN   potassium chloride (KLOR-CON) 10 MEQ tablet 20 mEq, Oral, Daily   PREVIDENT 5000 BOOSTER PLUS 1.1 % PSTE 1 application , Oral, Every evening   sertraline (ZOLOFT) 25 mg, Oral, Daily   silodosin (RAPAFLO) 8 mg, Oral, Daily at bedtime   Vitamin D (Ergocalciferol) (DRISDOL) 50,000 Units, Oral, Every 7 days     VITALS:   Vitals:   08/13/22 0943  BP: 123/84  Pulse: 78  Resp: 20  SpO2: 98%  Height:  (1.753 m)      07/14/2022    2:53 PM 01/05/2022    1:50 PM 07/23/2021    2:10 PM 09/12/2020   10:04 AM 09/12/2020    9:54 AM  Depression screen PHQ 2/9  Decreased Interest 0 3 0 0 0  Down, Depressed, Hopeless 0 0 0 0 0  PHQ - 2 Score 0 3 0 0 0  Altered sleeping 0 0     Tired, decreased energy 0 2     Change in appetite 0 2     Feeling bad or failure about yourself  0 0     Trouble concentrating 0 1     Moving slowly or fidgety/restless 0 2     Suicidal thoughts 0 0     PHQ-9  Score 0 10     Difficult doing work/chores Not difficult at all Not difficult at all       PHYSICAL EXAM   HEENT:  Normocephalic, atraumatic. The mucous membranes are moist. The superficial temporal arteries are without ropiness or tenderness. Cardiovascular: Regular rate and rhythm. Lungs: Clear to auscultation bilaterally. Neck: There are no carotid bruits noted  bilaterally.  NEUROLOGICAL:    08/13/2022   10:00 AM  Montreal Cognitive Assessment   Visuospatial/ Executive (0/5) 3  Naming (0/3) 3  Attention: Read list of digits (0/2) 0  Attention: Read list of letters (0/1) 1  Attention: Serial 7 subtraction starting at 100 (0/3) 0  Language: Repeat phrase (0/2) 2  Language : Fluency (0/1) 0  Abstraction (0/2) 0  Delayed Recall (0/5) 0  Orientation (0/6) 6  Total 15  Adjusted Score (based on education) 15       10/18/2017    2:54 PM  MMSE - Mini Mental State Exam  Orientation to time 5  Orientation to Place 5  Registration 3  Attention/ Calculation 3  Recall 2  Language- name 2 objects 2  Language- repeat 1  Language- follow 3 step command 3  Language- read & follow direction 1  Write a sentence 1  Copy design 1  Total score 27     Orientation:  Alert and oriented to person, place and time. No aphasia or dysarthria. Fund of knowledge is appropriate. Recent  and remote memory impaired. Attention and concentration are reduced.  Able to name objects and repeat phrases. Delayed recall 0/5  Cranial nerves: There is good facial symmetry. Extraocular muscles are intact and visual fields are full to confrontational testing. Speech is fluent and clear. No tongue deviation. Hearing is intact to conversational tone. Tone: Tone is good throughout. Sensation: Sensation is intact to light touch and pinprick throughout. Vibration is intact at the bilateral big toe.There is no extinction with double simultaneous stimulation. There is no sensory dermatomal level identified. Coordination: The patient has no difficulty with RAM's or FNF bilaterally. Normal finger to nose  Motor: Strength is 5/5 in the R upper and  B lower extremities  On the LUE 4/5 . There is no pronator drift. There are no fasciculations noted. DTR's: Deep tendon reflexes are 2/4 at the bilateral biceps, triceps, brachioradialis, patella and achilles.  Plantar responses are downgoing  bilaterally. Gait and Station: The patient is able to ambulate without difficulty.The patient is able to heel toe walk without any difficulty.The patient is able to ambulate in a tandem fashion. The patient is able to stand in the Romberg position. R cane to help ambulate      Thank you for allowing [place] the opportunity to participate in the care of this nice patient. Please do not hesitate to contact us for any questions or concerns.   Total time spent on today's visit was 62 minutes dedicated to this patient today, preparing to see patient, examining the patient, ordering tests and/or medications and counseling the patient, documenting clinical information in the EHR or other health record, independently interpreting results and communicating results to the patient/family, discussing treatment and goals, answering patient's questions and coordinating care.  Cc:  Myrlene Broker, MD  Marlowe Kays 08/13/2022 12:08 PM

## 2022-08-13 NOTE — Patient Instructions (Signed)

## 2022-08-17 ENCOUNTER — Other Ambulatory Visit: Payer: Federal, State, Local not specified - PPO

## 2022-08-25 ENCOUNTER — Other Ambulatory Visit: Payer: Self-pay

## 2022-08-25 ENCOUNTER — Telehealth: Payer: Self-pay | Admitting: Internal Medicine

## 2022-08-25 DIAGNOSIS — I119 Hypertensive heart disease without heart failure: Secondary | ICD-10-CM

## 2022-08-25 MED ORDER — ISOSORBIDE DINITRATE 30 MG PO TABS
30.0000 mg | ORAL_TABLET | Freq: Three times a day (TID) | ORAL | 1 refills | Status: DC
Start: 2022-08-25 — End: 2022-12-14

## 2022-08-25 MED ORDER — BUDESONIDE-FORMOTEROL FUMARATE 160-4.5 MCG/ACT IN AERO: INHALATION_SPRAY | RESPIRATORY_TRACT | 6 refills | Status: DC

## 2022-08-25 NOTE — Telephone Encounter (Signed)
Done

## 2022-08-25 NOTE — Telephone Encounter (Signed)
Prescription Request  08/25/2022  LOV: 07/14/2022  What is the name of the medication or equipment? Symbicort  & isobide  Have you contacted your pharmacy to request a refill? Yes   Which pharmacy would you like this sent to?  [id number] - [place], [place] - [address] AT [facility] [address]  [place] [address] Phone: [phone] Fax: [phone]    Patient notified that their request is being sent to the clinical staff for review and that they should receive a response within 2 business days.   Please advise at Mobile [phone] (mobile)

## 2022-09-11 ENCOUNTER — Ambulatory Visit (INDEPENDENT_AMBULATORY_CARE_PROVIDER_SITE_OTHER): Payer: Medicare HMO | Admitting: Podiatry

## 2022-09-11 DIAGNOSIS — M2141 Flat foot [pes planus] (acquired), right foot: Secondary | ICD-10-CM | POA: Diagnosis not present

## 2022-09-11 DIAGNOSIS — M2142 Flat foot [pes planus] (acquired), left foot: Secondary | ICD-10-CM

## 2022-09-11 DIAGNOSIS — E1142 Type 2 diabetes mellitus with diabetic polyneuropathy: Secondary | ICD-10-CM | POA: Diagnosis not present

## 2022-09-11 DIAGNOSIS — R600 Localized edema: Secondary | ICD-10-CM

## 2022-09-11 NOTE — Progress Notes (Signed)
Patient presents today to pick up diabetic shoes and insoles.  Patient was dispensed 1 pair of diabetic shoes and 3 pairs of foam casted diabetic insoles.   He tried on the shoes with the insoles and the fit was satisfactory.   Will follow up next year for new order.    

## 2022-09-13 ENCOUNTER — Other Ambulatory Visit: Payer: Self-pay | Admitting: Internal Medicine

## 2022-09-16 DIAGNOSIS — H04123 Dry eye syndrome of bilateral lacrimal glands: Secondary | ICD-10-CM | POA: Diagnosis not present

## 2022-09-16 DIAGNOSIS — E119 Type 2 diabetes mellitus without complications: Secondary | ICD-10-CM | POA: Diagnosis not present

## 2022-09-16 DIAGNOSIS — H10413 Chronic giant papillary conjunctivitis, bilateral: Secondary | ICD-10-CM | POA: Diagnosis not present

## 2022-09-16 DIAGNOSIS — H25813 Combined forms of age-related cataract, bilateral: Secondary | ICD-10-CM | POA: Diagnosis not present

## 2022-09-16 DIAGNOSIS — H40013 Open angle with borderline findings, low risk, bilateral: Secondary | ICD-10-CM | POA: Diagnosis not present

## 2022-09-16 LAB — HM DIABETES EYE EXAM

## 2022-10-06 ENCOUNTER — Encounter: Payer: Self-pay | Admitting: Internal Medicine

## 2022-10-06 ENCOUNTER — Ambulatory Visit (INDEPENDENT_AMBULATORY_CARE_PROVIDER_SITE_OTHER): Payer: Medicare HMO | Admitting: Internal Medicine

## 2022-10-06 VITALS — BP 122/78 | HR 96 | Temp 98.2°F | Ht 69.0 in | Wt 214.0 lb

## 2022-10-06 DIAGNOSIS — I25110 Atherosclerotic heart disease of native coronary artery with unstable angina pectoris: Secondary | ICD-10-CM | POA: Diagnosis not present

## 2022-10-06 DIAGNOSIS — N184 Chronic kidney disease, stage 4 (severe): Secondary | ICD-10-CM | POA: Diagnosis not present

## 2022-10-06 NOTE — Patient Instructions (Signed)
Try the symbicort regular for 1-2 weeks and see if this helps.

## 2022-10-06 NOTE — Progress Notes (Unsigned)
   Subjective:   Patient ID: Christian [name], male    DOB: October 26, 1945, 77 y.o.   MRN: [id number]  HPI The patient is a 77 YO man coming in for follow up with his wife who helps to provide history. He is struggling to want to take his medicines and they have some stress about his not following the diet and having some confusion.   Review of Systems  Unable to perform ROS: Dementia  Constitutional:  Positive for activity change.  HENT: Negative.    Eyes: Negative.   Respiratory:  Positive for shortness of breath. Negative for cough and chest tightness.   Cardiovascular:  Negative for chest pain, palpitations and leg swelling.  Gastrointestinal:  Negative for abdominal distention, abdominal pain, constipation, diarrhea, nausea and vomiting.  Skin: Negative.   Neurological: Negative.   Psychiatric/Behavioral: Negative.      Objective:  Physical Exam Constitutional:      Appearance: He is well-developed. He is obese.  HENT:     Head: Normocephalic and atraumatic.  Cardiovascular:     Rate and Rhythm: Normal rate and regular rhythm.  Pulmonary:     Effort: Pulmonary effort is normal. No respiratory distress.     Breath sounds: Normal breath sounds. No wheezing or rales.  Abdominal:     General: Bowel sounds are normal. There is no distension.     Palpations: Abdomen is soft.     Tenderness: There is no abdominal tenderness. There is no rebound.  Musculoskeletal:     Cervical back: Normal range of motion.  Skin:    General: Skin is warm and dry.  Neurological:     Mental Status: He is alert and oriented to person, place, and time.     Coordination: Coordination normal.     Vitals:   10/06/22 1542  BP: 122/78  Pulse: 96  Temp: 98.2 F (36.8 C)  TempSrc: Oral  SpO2: 96%  Weight: 214 lb (97.1 kg)  Height: 5\' 9"  (1.753 m)    Assessment & Plan:  Visit time 25 minutes in face to face communication with patient and coordination of care, additional 5 minutes spent in record  review, coordination or care, ordering tests, communicating/referring to other healthcare professionals, documenting in medical records all on the same day of the visit for total time 30 minutes spent on the visit.

## 2022-10-08 NOTE — Assessment & Plan Note (Signed)
Counseled about risk of death if he stops taking medications and need to follow his diet.

## 2022-10-08 NOTE — Assessment & Plan Note (Signed)
Counseled about likelihood of acceleration of CKD if he does not take medications consistently due to high BP. This will lead to dialysis. His AVG has thrill on exam.

## 2022-10-09 ENCOUNTER — Ambulatory Visit
Admission: RE | Admit: 2022-10-09 | Discharge: 2022-10-09 | Disposition: A | Discharge status: Home / Self Care | Payer: Medicare HMO | Source: Ambulatory Visit | Attending: Physician Assistant | Admitting: Physician Assistant

## 2022-10-09 DIAGNOSIS — R413 Other amnesia: Secondary | ICD-10-CM | POA: Diagnosis not present

## 2022-10-11 ENCOUNTER — Other Ambulatory Visit: Payer: Self-pay | Admitting: Cardiology

## 2022-10-11 DIAGNOSIS — I119 Hypertensive heart disease without heart failure: Secondary | ICD-10-CM

## 2022-10-12 DIAGNOSIS — N184 Chronic kidney disease, stage 4 (severe): Secondary | ICD-10-CM | POA: Diagnosis not present

## 2022-10-18 NOTE — Progress Notes (Signed)
MRI brain remarkable for age related changes such as volume loss and chronic changes in the vessels, but overall normal for age.

## 2022-10-21 DIAGNOSIS — E279 Disorder of adrenal gland, unspecified: Secondary | ICD-10-CM | POA: Diagnosis not present

## 2022-10-21 DIAGNOSIS — I129 Hypertensive chronic kidney disease with stage 1 through stage 4 chronic kidney disease, or unspecified chronic kidney disease: Secondary | ICD-10-CM | POA: Diagnosis not present

## 2022-10-21 DIAGNOSIS — N2581 Secondary hyperparathyroidism of renal origin: Secondary | ICD-10-CM | POA: Diagnosis not present

## 2022-10-21 DIAGNOSIS — E876 Hypokalemia: Secondary | ICD-10-CM | POA: Diagnosis not present

## 2022-10-21 DIAGNOSIS — D631 Anemia in chronic kidney disease: Secondary | ICD-10-CM | POA: Diagnosis not present

## 2022-10-21 DIAGNOSIS — I5032 Chronic diastolic (congestive) heart failure: Secondary | ICD-10-CM | POA: Diagnosis not present

## 2022-10-21 DIAGNOSIS — I770 Arteriovenous fistula, acquired: Secondary | ICD-10-CM | POA: Diagnosis not present

## 2022-10-21 DIAGNOSIS — I2510 Atherosclerotic heart disease of native coronary artery without angina pectoris: Secondary | ICD-10-CM | POA: Diagnosis not present

## 2022-10-21 DIAGNOSIS — R32 Unspecified urinary incontinence: Secondary | ICD-10-CM | POA: Diagnosis not present

## 2022-10-21 DIAGNOSIS — N184 Chronic kidney disease, stage 4 (severe): Secondary | ICD-10-CM | POA: Diagnosis not present

## 2022-10-21 DIAGNOSIS — E1122 Type 2 diabetes mellitus with diabetic chronic kidney disease: Secondary | ICD-10-CM | POA: Diagnosis not present

## 2022-10-25 ENCOUNTER — Other Ambulatory Visit: Payer: Self-pay | Admitting: Internal Medicine

## 2022-11-15 ENCOUNTER — Other Ambulatory Visit: Payer: Self-pay | Admitting: Internal Medicine

## 2022-11-18 ENCOUNTER — Other Ambulatory Visit: Payer: Self-pay | Admitting: Internal Medicine

## 2022-11-18 DIAGNOSIS — I25118 Atherosclerotic heart disease of native coronary artery with other forms of angina pectoris: Secondary | ICD-10-CM

## 2022-11-25 ENCOUNTER — Ambulatory Visit (INDEPENDENT_AMBULATORY_CARE_PROVIDER_SITE_OTHER): Payer: Medicare HMO | Admitting: Podiatry

## 2022-11-25 DIAGNOSIS — M79675 Pain in left toe(s): Secondary | ICD-10-CM

## 2022-11-25 DIAGNOSIS — B351 Tinea unguium: Secondary | ICD-10-CM

## 2022-11-25 DIAGNOSIS — E1142 Type 2 diabetes mellitus with diabetic polyneuropathy: Secondary | ICD-10-CM | POA: Diagnosis not present

## 2022-11-25 DIAGNOSIS — M79674 Pain in right toe(s): Secondary | ICD-10-CM

## 2022-11-25 NOTE — Progress Notes (Signed)
  Subjective:  Patient ID: Christian [name], male    DOB: 1946-02-10,  MRN: [id number]  Christian [name] presents to clinic today for at risk foot care with history of diabetic neuropathy and painful thick toenails that are difficult to trim. Pain interferes with ambulation. Aggravating factors include wearing enclosed shoe gear. Pain is relieved with periodic professional debridement. He is accompanied by his wife on today's visit. Chief Complaint  Patient presents with   Nail Problem    dfc  He is accompanied by his wife on today's visit.  New problem(s): None.   PCP is Myrlene Broker, MD.  No Known Allergies  Review of Systems: Negative except as noted in the HPI.  Objective: No changes noted in today's physical examination. There were no vitals filed for this visit. Christian [name] is a pleasant 77 y.o. male obese in NAD. AAO x 3.  Vascular Examination: CFT <3 seconds b/l LE. Palpable DP pulse(s) b/l LE. Faintly palpable PT pulse(s) b/l LE. Pedal hair absent. No pain with calf compression b/l. Lower extremity skin temperature gradient within normal limits. Nonpitting edema noted BLE. No varicosities noted. No cyanosis or clubbing noted b/l LE.  Dermatological Examination: Pedal skin is warm and supple b/l LE. No open wounds b/l LE. No interdigital macerations noted b/l LE. Toenails 1-5 bilaterally elongated, discolored, dystrophic, thickened, and crumbly with subungual debris and tenderness to dorsal palpation.  Neurological Examination: Protective sensation intact with 10 gram monofilament right lower extremity. Protective sensation diminished with 10 gram monofilament left lower extremity.  Musculoskeletal Examination: Muscle strength 5/5 to all lower extremity muscle groups bilaterally. Pes planus deformity noted bilateral LE. Wearing diabetic shoes today. Utilizes cane for ambulation assistance.  Assessment/Plan: 1. Pain due to onychomycosis of toenails of both feet    2. Diabetic polyneuropathy associated with type 2 diabetes mellitus (HCC)     -Patient was evaluated and treated. All patient's and/or POA's questions/concerns answered on today's visit. -Continue foot and shoe inspections daily. Monitor blood glucose per PCP/Endocrinologist's recommendations. -Continue diabetic shoes daily. -Mycotic toenails 1-5 bilaterally were debrided in length and girth with sterile nail nippers and dremel without incident. -Patient/POA to call should there be question/concern in the interim.   Return in about 3 months (around 02/25/2023).  Freddie Breech, DPM

## 2022-11-29 ENCOUNTER — Encounter: Payer: Self-pay | Admitting: Podiatry

## 2022-12-09 ENCOUNTER — Encounter: Payer: Self-pay | Admitting: Pulmonary Disease

## 2022-12-09 ENCOUNTER — Ambulatory Visit: Payer: Medicare HMO | Admitting: Pulmonary Disease

## 2022-12-09 VITALS — BP 112/62 | HR 88 | Temp 97.3°F | Ht 68.0 in | Wt 214.0 lb

## 2022-12-09 DIAGNOSIS — J683 Other acute and subacute respiratory conditions due to chemicals, gases, fumes and vapors: Secondary | ICD-10-CM | POA: Diagnosis not present

## 2022-12-09 DIAGNOSIS — J984 Other disorders of lung: Secondary | ICD-10-CM

## 2022-12-09 MED ORDER — BUDESONIDE-FORMOTEROL FUMARATE 160-4.5 MCG/ACT IN AERO
INHALATION_SPRAY | RESPIRATORY_TRACT | 6 refills | Status: DC
Start: 2022-12-09 — End: 2024-01-11

## 2022-12-09 NOTE — Patient Instructions (Signed)
Continue symbicort 2 puffs twice a day - rinse mouth out after each use - Continue to use with spacer   Use albuterol inhaler 1-2 breaths every 4-6 hours as needed   Continue use flonase nasal spray, sprays per nostril daily for sinus congestion  Follow up in 1 year, call sooner if needed

## 2022-12-09 NOTE — Progress Notes (Signed)
Synopsis: Referred in January 2022 for shortness of breath by Elvin So, PA  Subjective:   PATIENT ID: Christian [name] GENDER: male DOB: 13-Jun-1945, MRN: [id number]  HPI  Chief Complaint  Patient presents with   Follow-up    SOB    Christian [name] is a 77 year old male, former smoker with hypertension, CAD s/p CABG 2021, congestive heart failure and pulmonary embolism in 2021 who returns to pulmonary clinic for follow up.   He continues to have intermittent shortness of breath.  He is using Symbicort 160-4.72mcg 2 puffs twice daily.  He is using Flonase nasal spray for sinus congestion.  His postnasal drip and cough have improved since using Flonase.   OV 12/22/21 He reports his sinus congestion and drainage has improved since starting fluticasone nasal spray. He denies issues with wheezing or soreness in his chest. He feels the symbicort has helped. He continues to have exertional dyspnea. He has not been using his albuterol inhaler much.  OV 06/25/21 He was started on symbicort 160-4.61mcg 2 puffs twice daily at last visit without much improvement. He continues to experience intermittent wheezing. He reports having cold like symptoms over the past week with increased sinus congestion, drainage, cough and wheezing. He denies fevers and sweats but is having some chills. He has some central chest discomfort.  Chest radiograph from last visit shows persistent left loculated pleural effusion.   PFTs today show moderate restriction and moderate diffusion defect.   OV 05/22/21 Cardiology note reviewed from 05/08/21. He had extremely complex postop CABG complications including cardiogenic shock, cardiac arrest, needing internal CPR in the ICU after opening the chest, A. Fib needing cardioversion, closure of chest wall, developed acute renal failure leading temporary need for dialysis, right brain infarct with left hemiparesis, and multiple blood transfusions. Echocardiogram 11/2020 revealed  mildly reduced LVEF at 45-50%, therefore patient underwent stress test which was overall low risk.    He was noted to have left loculated pleural effusion based on chest imaging after his prolonged hospitalization. He reports having migratory chest discomforts that can be on the right side of his chest and move to the middle of his chest. He also experiences intermittent chest tightness. He has intermittent wheezing.   He reports trouble swallowing since his hospitalization in 2021. His wife reports he will eat reclined in his chair at times and have increased coughing when eating.   He reports second hand smoke exposure from his grandfather in childhood. He smoked for 3-4 years when he was in the [employer]. He worked in a [employer] where he was exposed to second hand smoke and he later worked for the post office.   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  LIMA TO LAD, RIMA TO PDA, RADIAL ARTERY TO CIRC AND SEQUENTIALLY TO OM1. 08/15/2019   Hyperlipidemia    Hypertension    Incomplete bladder emptying    Myocardial infarction (HCC) 08/09/2019   Nocturia    PE (pulmonary thromboembolism) (HCC) 08/09/2019   small RLL PE 08/09/19   Pneumonia    as a child   Pre-diabetes    Problems with swallowing pt states  test at [facility] approx 2012 shows a gastric valve  dysfunction--  eats small bites and drink liquids slowly   SOB (shortness of breath) on exertion      Family History  Problem Relation Age of Onset   Diabetes Mother    Hypertension Mother    Diabetes Father    Diabetes Brother    Hypertension Brother    Bone cancer Brother    Diabetes Brother      Social History   Socioeconomic History   Marital status: Married     Spouse name: Not on file   Number of children: 2   Years of education: Not on file   Highest education level: Associate degree: academic program  Occupational History   Occupation: Retired  Tobacco Use   Smoking status: Former    Current packs/day: 0.00    Average packs/day: 0.5 packs/day for 20.0 years (10.0 ttl pk-yrs)    Types: Cigarettes    Start date: 39    Quit date: 1975    Years since quitting: 49.6   Smokeless tobacco: Never  Vaping Use   Vaping status: Never Used  Substance and Sexual Activity   Alcohol use: Not Currently    Comment: very rarely - every now and then 1 drink/1 year if that   Drug use: No   Sexual activity: Not Currently  Other Topics Concern   Not on file  Social History Narrative   Right handed   Dirnks caffeine prn   Married   Lives with wife   Social Determinants of Health   Financial Resource Strain: Low Risk  (07/23/2021)   Overall Financial Resource Strain (CARDIA)    Difficulty of Paying Living Expenses: Not hard at all  Food Insecurity: No Food Insecurity (07/23/2021)   Hunger Vital Sign    Worried About Running Out of Food in the Last Year: Never true    Ran Out of Food in the Last Year: Never true  Transportation Needs: No Transportation Needs (07/23/2021)   PRAPARE - Administrator, Civil Service (Medical): No    Lack of Transportation (Non-Medical): No  Physical Activity: Inactive (07/23/2021)   Exercise Vital Sign    Days of Exercise per Week: 0 days    Minutes of Exercise per Session: 0 min  Stress: No Stress Concern Present (07/23/2021)   [employer] of Occupational Health - Occupational Stress Questionnaire    Feeling of Stress : Not at all  Social Connections: Socially Integrated (07/23/2021)   Social Connection and Isolation Panel [NHANES]    Frequency of Communication with Friends and Family: More than three times a week    Frequency of Social Gatherings with Friends and Family: More than three times a week     Attends Religious Services: More than 4 times per year    Active Member of [employer] or Organizations: Yes    Attends Engineer, structural: More than 4 times per year    Marital Status: Married  Catering manager Violence: Not At Risk (07/23/2021)   Humiliation, Afraid, Rape, and Kick questionnaire    Fear of Current or Ex-Partner: No    Emotionally Abused: No    Physically Abused: No    Sexually Abused: No     No Known Allergies   Outpatient Medications Prior to Visit  Medication Sig Dispense Refill   albuterol (VENTOLIN HFA) 108 (90 Base) MCG/ACT inhaler TAKE 2 PUFFS BY MOUTH EVERY 6 HOURS AS NEEDED FOR WHEEZE OR SHORTNESS OF  BREATH 8.5 each 3   aspirin EC 81 MG tablet Take 81 mg by mouth daily.     atorvastatin (LIPITOR) 20 MG tablet TAKE 1 TABLET BY MOUTH EVERY DAY IN THE EVENING 90 tablet 3   clonazePAM (KLONOPIN) 0.5 MG disintegrating tablet TAKE 1 TABLET BY MOUTH TWICE A DAY 60 tablet 5   clopidogrel (PLAVIX) 75 MG tablet TAKE 1 TABLET BY MOUTH EVERY DAY 90 tablet 2   cromolyn (OPTICROM) 4 % ophthalmic solution Place 1 drop into both eyes every Wednesday. At bedtime     [facility] ACETAMINOPHEN 325 MG tablet TAKE 2 CAPS BY MOUTH EVERY 6 HOURS AS NEEDED FOR PAIN FOR TEMP 100F OR ABOVE (Patient taking differently: Take 325 mg by mouth every 6 (six) hours as needed for mild pain or headache (temp over 100).) 60 tablet 0   [facility] SENNA PLUS 8.6-50 MG tablet TAKE 2 TABLETS BY MOUTH AT BEDTIME FOR CONSTIPATION 60 tablet 1   ferrous sulfate 325 (65 FE) MG tablet Take 325 mg by mouth daily with breakfast.     fluticasone (FLONASE) 50 MCG/ACT nasal spray SPRAY 1 SPRAY INTO EACH NOSTRIL EVERY DAY 48 mL 3   furosemide (LASIX) 80 MG tablet Take 120 mg by mouth 2 (two) times daily.     hydrALAZINE (APRESOLINE) 100 MG tablet TAKE 1 TABLET BY MOUTH 3 TIMES DAILY. 270 tablet 3   isosorbide dinitrate (ISORDIL) 30 MG tablet Take 1 tablet (30 mg total) by mouth 3 (three) times daily. 270 tablet 1    metolazone (ZAROXOLYN) 5 MG tablet Take 5 mg by mouth 2 (two) times a week.     metoprolol succinate (TOPROL-XL) 50 MG 24 hr tablet TAKE 3 TABLETS (150 MG TOTAL) BY MOUTH DAILY. TAKE WITH OR IMMEDIATELY FOLLOWING A MEAL. 270 tablet 1   Multiple Vitamin (MULTIVITAMIN) tablet Take 1 tablet by mouth daily.     nitroGLYCERIN (NITROSTAT) 0.4 MG SL tablet Place 0.4 mg under the tongue every 5 (five) minutes as needed for chest pain.     potassium chloride (KLOR-CON) 10 MEQ tablet Take 20 mEq by mouth daily.     PREVIDENT 5000 BOOSTER PLUS 1.1 % PSTE TAKE 1 APPLICATION BY MOUTH EVERY EVENING. 100 g 3   sertraline (ZOLOFT) 25 MG tablet TAKE 1 TABLET BY MOUTH EVERY DAY 90 tablet 3   silodosin (RAPAFLO) 8 MG CAPS capsule Take 1 capsule (8 mg total) by mouth at bedtime. 90 capsule 3   Vitamin D, Ergocalciferol, (DRISDOL) 1.25 MG (50000 UNIT) CAPS capsule TAKE 1 CAPSULE (50,000 UNITS TOTAL) BY MOUTH EVERY 7 (SEVEN) DAYS 12 capsule 3   budesonide-formoterol (SYMBICORT) 160-4.5 MCG/ACT inhaler INHALE 2 PUFFS INTO THE LUNGS TWICE A DAY 10.2 g 6   No facility-administered medications prior to visit.   Review of Systems  Constitutional:  Negative for chills, fever, malaise/fatigue and weight loss.  HENT:  Negative for congestion, sinus pain and sore throat.   Eyes: Negative.   Respiratory:  Positive for shortness of breath. Negative for cough, hemoptysis, sputum production and wheezing.   Cardiovascular:  Negative for chest pain, palpitations, orthopnea, claudication and leg swelling.  Gastrointestinal:  Negative for abdominal pain, heartburn, nausea and vomiting.  Genitourinary: Negative.   Musculoskeletal:  Negative for joint pain and myalgias.  Skin:  Negative for rash.  Neurological:  Negative for weakness.  Endo/Heme/Allergies: Negative.   Psychiatric/Behavioral: Negative.      Objective:   Vitals:   12/09/22 1346  BP: 112/62  Pulse: 88  Temp: (!) 97.3 F (36.3 C)  TempSrc: Temporal  SpO2:  96%  Weight: 214 lb (97.1 kg)  Height: 5\' 8"  (1.727 m)   Physical Exam Constitutional:      General: He is not in acute distress.    Appearance: He is obese.  HENT:     Head: Normocephalic and atraumatic.  Cardiovascular:     Rate and Rhythm: Normal rate and regular rhythm.     Pulses: Normal pulses.     Heart sounds: Normal heart sounds. No murmur heard. Pulmonary:     Breath sounds: Examination of the left-lower field reveals decreased breath sounds. Decreased breath sounds present. No wheezing, rhonchi or rales.  Musculoskeletal:     Right lower leg: No edema.     Left lower leg: No edema.  Skin:    General: Skin is warm and dry.  Neurological:     General: No focal deficit present.     Mental Status: He is alert.     CBC    Component Value Date/Time   WBC 10.9 (H) 07/14/2022 1639   RBC 3.92 (L) 07/14/2022 1639   HGB 12.2 (L) 08/12/2022 0715   HGB 11.9 (L) 05/08/2021 1201   HCT 36.0 (L) 08/12/2022 0715   HCT 35.7 (L) 05/08/2021 1201   PLT 184.0 07/14/2022 1639   PLT 205 05/08/2021 1201   MCV 102.8 (H) 07/14/2022 1639   MCV 97 05/08/2021 1201   MCH 32.3 05/08/2021 1201   MCH 33.0 12/12/2020 0336   MCHC 32.6 07/14/2022 1639   RDW 15.8 (H) 07/14/2022 1639   RDW 13.6 05/08/2021 1201   LYMPHSABS 1.6 12/11/2020 1533   MONOABS 0.8 12/11/2020 1533   EOSABS 0.1 12/11/2020 1533   BASOSABS 0.1 12/11/2020 1533      Latest Ref Rng & Units 08/12/2022    7:15 AM 07/14/2022    4:39 PM 04/16/2022   12:00 AM  BMP  Glucose 70 - 99 mg/dL 161  096    BUN 8 - 23 mg/dL 70  57    Creatinine 0.45 - 1.24 mg/dL 4.09  8.11  2.9      Sodium 135 - 145 mmol/L 142  141  143      Potassium 3.5 - 5.1 mmol/L 3.5  4.2  3.9      Chloride 98 - 111 mmol/L 103  105  109      CO2 19 - 32 mEq/L  26    Calcium 8.4 - 10.5 mg/dL  9.2  8.5         This result is from an external source.   Chest imaging: CXR 05/22/21 Unchanged appearance of the chest x-ray from the prior, with chronic left  pleural thickening/fluid and no definite evidence of acute cardiopulmonary disease.  CXR 12/23/20 Persistent and unchanged loculated parapneumonic effusion with associated left basilar atelectasis versus infiltrate. Stable cardiomegaly. Patient is status post median sternotomy with evidence of prior multivessel CABG. The right lung remains clear. No acute osseous abnormality.  CT Chest wo contrast 12/12/20 1. Cluster of ground-glass nodular density in the right lower lobe most consistent with atypical pneumonia. Small, chronic appearing bilateral pleural effusions, left greater than right. 2. Partial consolidative changes of the left lung base. 3. Aortic Atherosclerosis   PFT:    Latest Ref Rng & Units 06/25/2021   10:41 AM 08/11/2019    1:59 PM  PFT Results  FVC-Pre L 1.56  1.83   FVC-Predicted Pre % 43  50  FVC-Post L 1.56    FVC-Predicted Post % 43    Pre FEV1/FVC % % 88  81   Post FEV1/FCV % % 88    FEV1-Pre L 1.37  1.48   FEV1-Predicted Pre % 51  54   FEV1-Post L 1.36    DLCO uncorrected ml/min/mmHg 14.20    DLCO UNC% % 57    DLCO corrected ml/min/mmHg 15.52    DLCO COR %Predicted % 63    DLVA Predicted % 134    TLC L 3.32    TLC % Predicted % 48    RV % Predicted % 65    PFT 2023: Moderate restriction and moderate diffusion  Labs:  Path:  Echo 12/12/20: LV EF 45-50%. RV function and size is normal.   Heart Catheterization 08/10/19: LM: Normal LAD: Ostial 95% stenosis Ramus: Ostial/prox 30% stenosis LCx: Ostial 75% stenosis RCA: Prox-mid diffuse 40% calcific disease         Distal RCA 70% ISR   LVEDP 17 mmHg  NM Cardiac Stress Test 01/20/21 Lexiscan nuclear stress test performed using 1-day protocol. Normal myocardial perfusion. Stress LVEF calculated 49%, although visually appears normal. Low risk study.  Assessment & Plan:   Restrictive lung disease  Reactive airways dysfunction syndrome (HCC) - Plan: budesonide-formoterol (SYMBICORT) 160-4.5  MCG/ACT inhaler  Discussion: Ahman Pirl is a 77 year old male, former smoker with hypertension, CAD s/p CABG 2021, congestive heart failure and pulmonary embolism in 2021 who returns to pulmonary clinic for follow-up.   His shortness of breath is multifactorial in setting of heart failure with reduced EF, trapped lung physiology of the left lower lobe and reactive airways disease.   He is to continue ICS/LABA therapy with symbicort 160-4.62mcg 2 puffs twice daily and continue as needed albuterol.   He is to continue flonase nasal spray, 2 sprays per nostril daily for sinus congestion and drainage.   Follow up in 1 year.  Melody Comas, MD [facility] Office: [phone]   Current Outpatient Medications:    albuterol (VENTOLIN HFA) 108 (90 Base) MCG/ACT inhaler, TAKE 2 PUFFS BY MOUTH EVERY 6 HOURS AS NEEDED FOR WHEEZE OR SHORTNESS OF BREATH, Disp: 8.5 each, Rfl: 3   aspirin EC 81 MG tablet, Take 81 mg by mouth daily., Disp: , Rfl:    atorvastatin (LIPITOR) 20 MG tablet, TAKE 1 TABLET BY MOUTH EVERY DAY IN THE EVENING, Disp: 90 tablet, Rfl: 3   clonazePAM (KLONOPIN) 0.5 MG disintegrating tablet, TAKE 1 TABLET BY MOUTH TWICE A DAY, Disp: 60 tablet, Rfl: 5   clopidogrel (PLAVIX) 75 MG tablet, TAKE 1 TABLET BY MOUTH EVERY DAY, Disp: 90 tablet, Rfl: 2   cromolyn (OPTICROM) 4 % ophthalmic solution, Place 1 drop into both eyes every Wednesday. At bedtime, Disp: , Rfl:    [facility]S ACETAMINOPHEN 325 MG tablet, TAKE 2 CAPS BY MOUTH EVERY 6 HOURS AS NEEDED FOR PAIN FOR TEMP 100F OR ABOVE (Patient taking differently: Take 325 mg by mouth every 6 (six) hours as needed for mild pain or headache (temp over 100).), Disp: 60 tablet, Rfl: 0   CVS SENNA PLUS 8.6-50 MG tablet, TAKE 2 TABLETS BY MOUTH AT BEDTIME FOR CONSTIPATION, Disp: 60 tablet, Rfl: 1   ferrous sulfate 325 (65 FE) MG tablet, Take 325 mg by mouth daily with breakfast., Disp: , Rfl:    fluticasone (FLONASE) 50 MCG/ACT  nasal spray, SPRAY 1 SPRAY INTO EACH NOSTRIL EVERY DAY, Disp: 48 mL, Rfl: 3   furosemide (  LASIX) 80 MG tablet, Take 120 mg by mouth 2 (two) times daily., Disp: , Rfl:    hydrALAZINE (APRESOLINE) 100 MG tablet, TAKE 1 TABLET BY MOUTH 3 TIMES DAILY., Disp: 270 tablet, Rfl: 3   isosorbide dinitrate (ISORDIL) 30 MG tablet, Take 1 tablet (30 mg total) by mouth 3 (three) times daily., Disp: 270 tablet, Rfl: 1   metolazone (ZAROXOLYN) 5 MG tablet, Take 5 mg by mouth 2 (two) times a week., Disp: , Rfl:    metoprolol succinate (TOPROL-XL) 50 MG 24 hr tablet, TAKE 3 TABLETS (150 MG TOTAL) BY MOUTH DAILY. TAKE WITH OR IMMEDIATELY FOLLOWING A MEAL., Disp: 270 tablet, Rfl: 1   Multiple Vitamin (MULTIVITAMIN) tablet, Take 1 tablet by mouth daily., Disp: , Rfl:    nitroGLYCERIN (NITROSTAT) 0.4 MG SL tablet, Place 0.4 mg under the tongue every 5 (five) minutes as needed for chest pain., Disp: , Rfl:    potassium chloride (KLOR-CON) 10 MEQ tablet, Take 20 mEq by mouth daily., Disp: , Rfl:    PREVIDENT 5000 BOOSTER PLUS 1.1 % PSTE, TAKE 1 APPLICATION BY MOUTH EVERY EVENING., Disp: 100 g, Rfl: 3   sertraline (ZOLOFT) 25 MG tablet, TAKE 1 TABLET BY MOUTH EVERY DAY, Disp: 90 tablet, Rfl: 3   silodosin (RAPAFLO) 8 MG CAPS capsule, Take 1 capsule (8 mg total) by mouth at bedtime., Disp: 90 capsule, Rfl: 3   Vitamin D, Ergocalciferol, (DRISDOL) 1.25 MG (50000 UNIT) CAPS capsule, TAKE 1 CAPSULE (50,000 UNITS TOTAL) BY MOUTH EVERY 7 (SEVEN) DAYS, Disp: 12 capsule, Rfl: 3   budesonide-formoterol (SYMBICORT) 160-4.5 MCG/ACT inhaler, INHALE 2 PUFFS INTO THE LUNGS TWICE A DAY, Disp: 10.2 g, Rfl: 6

## 2022-12-12 ENCOUNTER — Encounter: Payer: Self-pay | Admitting: Pulmonary Disease

## 2022-12-13 ENCOUNTER — Other Ambulatory Visit: Payer: Self-pay | Admitting: Internal Medicine

## 2022-12-13 DIAGNOSIS — I119 Hypertensive heart disease without heart failure: Secondary | ICD-10-CM

## 2023-01-08 ENCOUNTER — Ambulatory Visit: Payer: Federal, State, Local not specified - PPO | Admitting: Internal Medicine

## 2023-01-11 DIAGNOSIS — N184 Chronic kidney disease, stage 4 (severe): Secondary | ICD-10-CM | POA: Diagnosis not present

## 2023-01-13 ENCOUNTER — Ambulatory Visit: Payer: Federal, State, Local not specified - PPO | Admitting: Cardiology

## 2023-01-18 ENCOUNTER — Encounter: Payer: Self-pay | Admitting: Internal Medicine

## 2023-01-18 ENCOUNTER — Ambulatory Visit: Payer: Medicare HMO | Admitting: Internal Medicine

## 2023-01-18 VITALS — BP 120/84 | HR 121 | Temp 98.9°F | Ht 68.0 in | Wt 211.0 lb

## 2023-01-18 DIAGNOSIS — E1142 Type 2 diabetes mellitus with diabetic polyneuropathy: Secondary | ICD-10-CM

## 2023-01-18 DIAGNOSIS — R413 Other amnesia: Secondary | ICD-10-CM | POA: Diagnosis not present

## 2023-01-18 DIAGNOSIS — E559 Vitamin D deficiency, unspecified: Secondary | ICD-10-CM | POA: Diagnosis not present

## 2023-01-18 DIAGNOSIS — Z23 Encounter for immunization: Secondary | ICD-10-CM | POA: Diagnosis not present

## 2023-01-18 LAB — LIPID PANEL
Cholesterol: 140 mg/dL (ref 0–200)
HDL: 60.6 mg/dL (ref >39.00)
LDL Cholesterol: 47 mg/dL (ref 0–99)
NonHDL: 79.87
Total CHOL/HDL Ratio: 2
Triglycerides: 162 mg/dL — ABNORMAL HIGH (ref 0.0–149.0)
VLDL: 32.4 mg/dL (ref 0.0–40.0)

## 2023-01-18 LAB — HEMOGLOBIN A1C: Hgb A1c MFr Bld: 9.5 % — ABNORMAL HIGH (ref 4.6–6.5)

## 2023-01-18 LAB — MICROALBUMIN / CREATININE URINE RATIO
Creatinine,U: 113.1 mg/dL
Microalb Creat Ratio: 1.3 mg/g (ref 0.0–30.0)
Microalb, Ur: 1.5 mg/dL (ref 0.0–1.9)

## 2023-01-18 LAB — VITAMIN D 25 HYDROXY (VIT D DEFICIENCY, FRACTURES): VITD: 57.69 ng/mL (ref 30.00–100.00)

## 2023-01-18 LAB — VITAMIN B12: Vitamin B-12: 565 pg/mL (ref 211–911)

## 2023-01-18 NOTE — Progress Notes (Unsigned)
Subjective:   Patient ID: Christian [name], male    DOB: Dec 04, 1945, 77 y.o.   MRN: [id number]  HPI The patient is a 77 YO man coming in for neck pain and running down arm. Going on for several months without significant change.   Review of Systems  Unable to perform ROS: Dementia  Constitutional: Negative.   HENT: Negative.    Eyes: Negative.   Respiratory:  Negative for cough, chest tightness and shortness of breath.   Cardiovascular:  Negative for chest pain, palpitations and leg swelling.  Gastrointestinal:  Negative for abdominal distention, abdominal pain, constipation, diarrhea, nausea and vomiting.  Musculoskeletal:  Positive for arthralgias and myalgias.  Skin: Negative.   Neurological: Negative.   Psychiatric/Behavioral: Negative.      Objective:  Physical Exam Constitutional:      Appearance: He is well-developed.  HENT:     Head: Normocephalic and atraumatic.  Cardiovascular:     Rate and Rhythm: Normal rate and regular rhythm.  Pulmonary:     Effort: Pulmonary effort is normal. No respiratory distress.     Breath sounds: Normal breath sounds. No wheezing or rales.  Abdominal:     General: Bowel sounds are normal. There is no distension.     Palpations: Abdomen is soft.     Tenderness: There is no abdominal tenderness. There is no rebound.  Musculoskeletal:     Cervical back: Normal range of motion.  Skin:    General: Skin is warm and dry.  Neurological:     Mental Status: He is alert and oriented to person, place, and time.     Coordination: Coordination normal.  Psychiatric:     Comments: Patient and wife with some differences in opinion throughout the visit     Vitals:   01/18/23 1036  BP: 120/84  Pulse: (!) 121  Temp: 98.9 F (37.2 C)  TempSrc: Oral  SpO2: 95%  Weight: 211 lb (95.7 kg)  Height: 5\' 8"  (1.727 m)   Flu shot given at visit Assessment & Plan:  Visit time 30 minutes in face to face communication with patient and coordination of  care, additional 5 minutes spent in record review, coordination or care, ordering tests, communicating/referring to other healthcare professionals, documenting in medical records all on the same day of the visit for total time 35 minutes spent on the visit.

## 2023-01-18 NOTE — Patient Instructions (Addendum)
We will check the labs today.   Likely the pain is from arthritis.

## 2023-01-21 ENCOUNTER — Other Ambulatory Visit: Payer: Self-pay | Admitting: Internal Medicine

## 2023-01-21 DIAGNOSIS — N2581 Secondary hyperparathyroidism of renal origin: Secondary | ICD-10-CM | POA: Diagnosis not present

## 2023-01-21 DIAGNOSIS — I2510 Atherosclerotic heart disease of native coronary artery without angina pectoris: Secondary | ICD-10-CM | POA: Diagnosis not present

## 2023-01-21 DIAGNOSIS — R32 Unspecified urinary incontinence: Secondary | ICD-10-CM | POA: Diagnosis not present

## 2023-01-21 DIAGNOSIS — I5032 Chronic diastolic (congestive) heart failure: Secondary | ICD-10-CM | POA: Diagnosis not present

## 2023-01-21 DIAGNOSIS — N184 Chronic kidney disease, stage 4 (severe): Secondary | ICD-10-CM | POA: Diagnosis not present

## 2023-01-21 DIAGNOSIS — B888 Other specified infestations: Secondary | ICD-10-CM | POA: Diagnosis not present

## 2023-01-21 DIAGNOSIS — I770 Arteriovenous fistula, acquired: Secondary | ICD-10-CM | POA: Diagnosis not present

## 2023-01-21 DIAGNOSIS — E279 Disorder of adrenal gland, unspecified: Secondary | ICD-10-CM | POA: Diagnosis not present

## 2023-01-21 DIAGNOSIS — E876 Hypokalemia: Secondary | ICD-10-CM | POA: Diagnosis not present

## 2023-01-21 DIAGNOSIS — E1122 Type 2 diabetes mellitus with diabetic chronic kidney disease: Secondary | ICD-10-CM | POA: Diagnosis not present

## 2023-01-21 DIAGNOSIS — D631 Anemia in chronic kidney disease: Secondary | ICD-10-CM | POA: Diagnosis not present

## 2023-01-21 DIAGNOSIS — I129 Hypertensive chronic kidney disease with stage 1 through stage 4 chronic kidney disease, or unspecified chronic kidney disease: Secondary | ICD-10-CM | POA: Diagnosis not present

## 2023-01-21 MED ORDER — GLIMEPIRIDE 2 MG PO TABS
2.0000 mg | ORAL_TABLET | Freq: Every day | ORAL | 3 refills | Status: DC
Start: 2023-01-21 — End: 2023-02-19

## 2023-01-21 NOTE — Assessment & Plan Note (Signed)
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 He and wife seem to have ongoing tension related to him not wanting to take medicines or get out of bed and she not wanting to nag him and he gets upset about her nagging him. I have suggested some marriage or individual counseling to help wife and patient set boundaries on behavior so that they can come to some agreement. He does need supervision on medication management. We discussed that his life will end sooner if not taking his medications and he is a bit indifferent to this which makes wife upset.

## 2023-01-21 NOTE — Assessment & Plan Note (Signed)
They admit he is not eating well at this time. Options for treatment are limited given GFR. Checking HgA1c, lipid panel and microalbumin to creatinine ratio. Adjust as needed.

## 2023-01-23 ENCOUNTER — Other Ambulatory Visit: Payer: Self-pay | Admitting: Internal Medicine

## 2023-02-14 ENCOUNTER — Other Ambulatory Visit: Payer: Self-pay | Admitting: Internal Medicine

## 2023-03-14 ENCOUNTER — Other Ambulatory Visit: Payer: Self-pay | Admitting: Internal Medicine

## 2023-03-22 ENCOUNTER — Ambulatory Visit: Payer: Medicare HMO | Attending: Internal Medicine | Admitting: Cardiology

## 2023-03-22 ENCOUNTER — Encounter: Payer: Self-pay | Admitting: Cardiology

## 2023-03-22 VITALS — BP 132/72 | HR 91 | Resp 16 | Ht 68.0 in | Wt 203.0 lb

## 2023-03-22 DIAGNOSIS — I739 Peripheral vascular disease, unspecified: Secondary | ICD-10-CM | POA: Diagnosis not present

## 2023-03-22 DIAGNOSIS — I10 Essential (primary) hypertension: Secondary | ICD-10-CM

## 2023-03-22 DIAGNOSIS — N184 Chronic kidney disease, stage 4 (severe): Secondary | ICD-10-CM | POA: Diagnosis not present

## 2023-03-22 DIAGNOSIS — E7800 Pure hypercholesterolemia, unspecified: Secondary | ICD-10-CM | POA: Diagnosis not present

## 2023-03-22 DIAGNOSIS — E1122 Type 2 diabetes mellitus with diabetic chronic kidney disease: Secondary | ICD-10-CM

## 2023-03-22 DIAGNOSIS — I25118 Atherosclerotic heart disease of native coronary artery with other forms of angina pectoris: Secondary | ICD-10-CM | POA: Diagnosis not present

## 2023-03-22 NOTE — Progress Notes (Signed)
Cardiology Office Note:  .   Date:  03/22/2023  ID:  Christian [name], DOB November 01, 1945, MRN [id number] PCP: Myrlene Broker, MD  [facility] Providers Cardiologist:  Yates Decamp, MD   History of Present Illness: .   Christian [name] is a 77 y.o.  AA male with hypertension, hyperlipidemia, DM with stage 3 CKD,  CAD with  stenting to the PDA on 02/27/2014. Patient presented with unstable angina and NSTEMI on 08/09/2019 along with a very small subsegmental pulmonary embolism and discharged on 10/18/2019 after he underwent CABG x4 on 08/15/2019.     He had extremely complex postop complications including cardiogenic shock, cardiac arrest, needing internal CPR in the ICU after opening the chest,  acute renal failure leading to need for dialysis, right brain infarct with left hemiparesis, multiple blood transfusions.  He was also placed on dialysis however is now off of dialysis since 02/01/2020.  Has underlying stage IV chronic kidney disease.  Discussed the use of AI scribe software for clinical note transcription with the patient, who gave verbal consent to proceed.  History of Present Illness   Christian [name], a patient with a history of stroke, kidney disease, and heart problems, presents with a complaint of pain under his right breast that occurred a month ago. The pain, which he describes as a "little pain," was located on the right side of his chest and lasted for a few minutes before subsiding. He has not experienced this pain since then. The patient also reports a soreness in his neck, which has been attributed to arthritis.  In addition to these symptoms, the patient's wife expresses concern about his diet and lack of physical activity. Despite his wife's efforts to provide him with a healthy diet, the patient has a preference for foods such as bacon and ham, which are high in sodium and could potentially exacerbate his health conditions. The patient's wife also notes that he spends most  of his time in a recliner and does not engage in much physical activity, despite being able to walk with assistance.        Review of Systems  Cardiovascular:  Positive for chest pain. Negative for dyspnea on exertion and leg swelling.    Labs   Lab Results  Component Value Date   CHOL 140 01/18/2023   HDL 60.60 01/18/2023   LDLCALC 47 01/18/2023   LDLDIRECT 40.0 11/23/2018   TRIG 162.0 (H) 01/18/2023   CHOLHDL 2 01/18/2023   Lab Results  Component Value Date   NA 142 08/12/2022   K 3.5 08/12/2022   CO2 26 07/14/2022   GLUCOSE 147 (H) 08/12/2022   BUN 70 (H) 08/12/2022   CREATININE 3.50 (H) 08/12/2022   CALCIUM 9.2 07/14/2022   GFR 19.10 (L) 07/14/2022   EGFR 23.0 07/14/2022   GFRNONAA 26 (L) 12/16/2020      Latest Ref Rng & Units 08/12/2022    7:15 AM 07/14/2022    4:39 PM 04/16/2022   12:00 AM  BMP  Glucose 70 - 99 mg/dL 865  784    BUN 8 - 23 mg/dL 70  57    Creatinine 6.96 - 1.24 mg/dL 2.95  2.84  2.9      Sodium 135 - 145 mmol/L 142  141  143      Potassium 3.5 - 5.1 mmol/L 3.5  4.2  3.9      Chloride 98 - 111 mmol/L 103  105  109  CO2 19 - 32 mEq/L  26    Calcium 8.4 - 10.5 mg/dL  9.2  8.5         This result is from an external source.       Latest Ref Rng & Units 08/12/2022    7:15 AM 07/14/2022    4:39 PM 01/05/2022    2:28 PM  CBC  WBC 4.0 - 10.5 K/uL  10.9  9.7   Hemoglobin 13.0 - 17.0 g/dL 16.1  09.6  04.5   Hematocrit 39.0 - 52.0 % 36.0  40.3  38.9   Platelets 150.0 - 400.0 K/uL  184.0  176.0    Lab Results  Component Value Date   HGBA1C 9.5 (H) 01/18/2023    Physical Exam:   VS:  BP 132/72 (BP Location: Left Arm, Patient Position: Sitting, Cuff Size: Large)   Pulse 91   Resp 16   Ht 5\' 8"  (1.727 m)   Wt 203 lb (92.1 kg)   SpO2 97%   BMI 30.87 kg/m    Wt Readings from Last 3 Encounters:  03/22/23 203 lb (92.1 kg)  01/18/23 211 lb (95.7 kg)  12/09/22 214 lb (97.1 kg)     Physical Exam Neck:     Vascular: No carotid bruit or  JVD.  Cardiovascular:     Rate and Rhythm: Normal rate and regular rhythm.     Pulses: Intact distal pulses.          Popliteal pulses are 2+ on the right side and 1+ on the left side.       Dorsalis pedis pulses are 0 on the right side and 0 on the left side.       Posterior tibial pulses are 0 on the right side and 0 on the left side.     Heart sounds: Normal heart sounds. No murmur heard.    No gallop.  Pulmonary:     Effort: Pulmonary effort is normal.     Breath sounds: Normal breath sounds.  Abdominal:     General: Bowel sounds are normal.     Palpations: Abdomen is soft.  Musculoskeletal:     Right lower leg: Edema (1-2+ ankle edema) present.     Left lower leg: Edema (2+ pitting left ankle edema) present.     Studies Reviewed: Marland Kitchen    Coronary angiogram 08/10/2019:  CABG 08/15/2019: LIMA TO LAD, RIMA TO PDA, SEQUENTIAL RADIAL ARTERY TO Cx-OM1   Echocardiogram 05/12/2021:  Poor echo window. Wall motion with reduced sensitivity. Left ventricle  cavity is normal in size. Mild concentric remodeling of the left  ventricle. Normal global wall motion. Abnormal septal wall motion due to  post-operative coronary artery bypass graft. Doppler evidence of grade I  (impaired) diastolic dysfunction, normal LAP. Normal LV systolic function  with visual EF 50-55%. Calculated EF 56%.  No significant change from 07/22/2020.   Lexiscan Tetrofosmin stress test 01/19/2022: 1 Day Rest/Stress protocol.  Stress EKG is non-diagnostic as target heart rate not achieved, as this is pharmacological stress test using Lexiscan. Hypertensive response at rest (BP 200/100 mmHG) Normal myocardial perfusion without obvious evidence of reversible myocardial ischemia or prior infarct. Left ventricular wall thickness is preserved with mild septal hypokinesis likely from mechanical effects of post-operative sternum.  Calculated LVEF 54%. Prior study 01/20/2021: Per report normal myocardial perfusion, stress  LVEF 49%, visually appears normal. Low risk study.  EKG:    EKG 07/10/2022: Normal sinus rhythm at rate of 85 beats minute, left  renal enlargement, normal axis.  Incomplete right bundle branch block.  Nonspecific T abnormality.  Compared to 01/05/2022, no significant change.  Medications and allergies    No Known Allergies  Current Meds  Medication Sig   atorvastatin (LIPITOR) 20 MG tablet TAKE 1 TABLET BY MOUTH EVERY DAY IN THE EVENING   budesonide-formoterol (SYMBICORT) 160-4.5 MCG/ACT inhaler INHALE 2 PUFFS INTO THE LUNGS TWICE A DAY   clonazePAM (KLONOPIN) 0.5 MG disintegrating tablet TAKE 1 TABLET BY MOUTH TWICE A DAY   clopidogrel (PLAVIX) 75 MG tablet TAKE 1 TABLET BY MOUTH EVERY DAY   cromolyn (OPTICROM) 4 % ophthalmic solution Place 1 drop into both eyes every Wednesday. At bedtime   [employer] ACETAMINOPHEN 325 MG tablet TAKE 2 CAPS BY MOUTH EVERY 6 HOURS AS NEEDED FOR PAIN FOR TEMP 100F OR ABOVE (Patient taking differently: Take 325 mg by mouth every 6 (six) hours as needed for mild pain (pain score 1-3) or headache (temp over 100).)   [employer] SENNA PLUS 8.6-50 MG tablet TAKE 2 TABLETS BY MOUTH AT BEDTIME FOR CONSTIPATION   ferrous sulfate 325 (65 FE) MG tablet Take 325 mg by mouth daily with breakfast.   fluticasone (FLONASE) 50 MCG/ACT nasal spray SPRAY 1 SPRAY INTO EACH NOSTRIL EVERY DAY   furosemide (LASIX) 80 MG tablet Take 120 mg by mouth 2 (two) times daily.   glimepiride (AMARYL) 2 MG tablet TAKE 1 TABLET BY MOUTH DAILY BEFORE BREAKFAST.   hydrALAZINE (APRESOLINE) 100 MG tablet TAKE 1 TABLET BY MOUTH 3 TIMES DAILY.   isosorbide dinitrate (ISORDIL) 30 MG tablet TAKE 1 TABLET BY MOUTH 3 TIMES DAILY.   metolazone (ZAROXOLYN) 5 MG tablet Take 5 mg by mouth 2 (two) times a week.   metoprolol succinate (TOPROL-XL) 50 MG 24 hr tablet TAKE 3 TABLETS (150 MG TOTAL) BY MOUTH DAILY. TAKE WITH OR IMMEDIATELY FOLLOWING A MEAL.   Multiple Vitamin (MULTIVITAMIN) tablet Take 1 tablet by mouth  daily.   nitroGLYCERIN (NITROSTAT) 0.4 MG SL tablet Place 0.4 mg under the tongue every 5 (five) minutes as needed for chest pain.   potassium chloride (KLOR-CON) 10 MEQ tablet Take 30 mEq by mouth daily.   PREVIDENT 5000 BOOSTER PLUS 1.1 % PSTE TAKE 1 APPLICATION BY MOUTH EVERY EVENING.   sertraline (ZOLOFT) 25 MG tablet TAKE 1 TABLET BY MOUTH EVERY DAY   silodosin (RAPAFLO) 8 MG CAPS capsule Take 1 capsule (8 mg total) by mouth at bedtime.   Vitamin D, Ergocalciferol, (DRISDOL) 1.25 MG (50000 UNIT) CAPS capsule TAKE 1 CAPSULE (50,000 UNITS TOTAL) BY MOUTH EVERY 7 (SEVEN) DAYS   [DISCONTINUED] aspirin EC 81 MG tablet Take 81 mg by mouth daily.     ASSESSMENT AND PLAN: .      ICD-10-CM   1. Coronary artery disease of native artery of native heart with stable angina pectoris (HCC)  I25.118     2. Primary hypertension  I10     3. Claudication in peripheral vascular disease (HCC)  I73.9     4. Hypercholesteremia  E78.00     5. Type 2 diabetes mellitus with stage 4 chronic kidney disease, without long-term current use of insulin (HCC)  E11.22    N18.4       1. Coronary artery disease of native artery of native heart with stable angina pectoris (HCC) Without recurrence of angina pectoris, he has not used any recent sublingual nitroglycerin. Will discontinue aspirin and continue Plavix indefinitely.  2. Primary hypertension With regard to hypertension, patient is  on metoprolol succinate, hydralazine and isosorbide dinitrate and blood pressure is well-controlled.  In view of stage IV chronic kidney disease, he is not on an ACE inhibitor or an ARB.  3. Claudication in peripheral vascular disease (HCC) He has severe peripheral arterial disease, no limb threatening ischemia, his capillary refill is <2 seconds in bilateral lower extremity.  He has no symptoms of claudication.  Continue present medical management and risk factor modification especially control of diabetes mellitus.  4.  Hypercholesteremia I reviewed his lipids, LDL is under good control, triglycerides minimally elevated related to uncontrolled diabetes mellitus.  5. Diabetes mellitus type 2 uncontrolled with stage IV chronic kidney disease without use of insulin.  Poor Dietary Habits Regular consumption of high sodium foods like bacon and ham, contributing to weight gain and potential exacerbation of underlying conditions. -Advise on healthier dietary choices and moderation of high sodium foods. Diabetes Mellitus A1c increased from 7 to 9, currently on Glimepiride 2mg . -Message to Dr. Hillard Danker regarding potential addition of once-weekly injectable/GLP-1 agonist as diabetes medication.  He has had brief episode of chest pain that occurred a month ago with no recurrence.  Do not suspect angina pectoris.  Assessment and Plan    Chest Pain Brief episode of chest pain a month ago, not associated with exertion and has not recurred. No nitroglycerin used. -Continue current management and monitor for recurrence.  Sedentary Lifestyle Limited physical activity, primarily sedentary with resistance to exercise. -Encourage increased physical activity, even while seated.  Poor Dietary Habits Regular consumption of high sodium foods like bacon and ham, contributing to weight gain and potential exacerbation of underlying conditions. -Advise on healthier dietary choices and moderation of high sodium foods.  Arthritis in Neck Reports of significant pain. -Consider evaluation and management options for arthritis pain.  Diabetes Mellitus A1c increased from 7 to 9, currently on Glimepiride 2mg . Discussion of adding injectable medication for better glycemic control. -Message to Dr. Hillard Danker regarding potential addition of once-weekly injectable diabetes medication.  Stage 4 Chronic Kidney Disease Stable, on Lasix and Spironolactone for fluid management. -Continue current management.  Follow-up  in 6 months.               Signed,  Yates Decamp, MD, [facility] 03/22/2023, 10:55 AM [facility] [address] [place], [place] [address] Phone: [phone]. Fax:  [phone]

## 2023-03-22 NOTE — Patient Instructions (Addendum)
Medication Instructions:  Your physician has recommended you make the following change in your medication: Stop aspirin   *If you need a refill on your cardiac medications before your next appointment, please call your pharmacy*   Lab Work: none If you have labs (blood work) drawn today and your tests are completely normal, you will receive your results only by: MyChart Message (if you have MyChart) OR A paper copy in the mail If you have any lab test that is abnormal or we need to change your treatment, we will call you to review the results.   Testing/Procedures: none   Follow-Up: At [facility], you and your health needs are our priority.  As part of our continuing mission to provide you with exceptional heart care, we have created designated Provider Care Teams.  These Care Teams include your primary Cardiologist (physician) and Advanced Practice Providers (APPs -  Physician Assistants and Nurse Practitioners) who all work together to provide you with the care you need, when you need it.  We recommend signing up for the patient portal called "MyChart".  Sign up information is provided on this After Visit Summary.  MyChart is used to connect with patients for Virtual Visits (Telemedicine).  Patients are able to view lab/test results, encounter notes, upcoming appointments, etc.  Non-urgent messages can be sent to your provider as well.   To learn more about what you can do with MyChart, go to [email or web address].    Your next appointment:   6 month(s)  Provider:   Yates Decamp, MD     Other Instructions

## 2023-04-06 ENCOUNTER — Emergency Department (HOSPITAL_COMMUNITY): Payer: Medicare HMO

## 2023-04-06 ENCOUNTER — Emergency Department (HOSPITAL_COMMUNITY)
Admission: EM | Admit: 2023-04-06 | Discharge: 2023-04-06 | Disposition: A | Discharge status: Home / Self Care | Payer: Medicare HMO | Attending: Emergency Medicine | Admitting: Emergency Medicine

## 2023-04-06 ENCOUNTER — Encounter (HOSPITAL_COMMUNITY): Payer: Self-pay

## 2023-04-06 DIAGNOSIS — G9389 Other specified disorders of brain: Secondary | ICD-10-CM | POA: Diagnosis not present

## 2023-04-06 DIAGNOSIS — M542 Cervicalgia: Secondary | ICD-10-CM | POA: Diagnosis not present

## 2023-04-06 DIAGNOSIS — N184 Chronic kidney disease, stage 4 (severe): Secondary | ICD-10-CM | POA: Diagnosis not present

## 2023-04-06 DIAGNOSIS — E1122 Type 2 diabetes mellitus with diabetic chronic kidney disease: Secondary | ICD-10-CM | POA: Insufficient documentation

## 2023-04-06 DIAGNOSIS — E876 Hypokalemia: Secondary | ICD-10-CM | POA: Diagnosis not present

## 2023-04-06 DIAGNOSIS — R262 Difficulty in walking, not elsewhere classified: Secondary | ICD-10-CM | POA: Diagnosis not present

## 2023-04-06 DIAGNOSIS — Z79899 Other long term (current) drug therapy: Secondary | ICD-10-CM | POA: Diagnosis not present

## 2023-04-06 DIAGNOSIS — I129 Hypertensive chronic kidney disease with stage 1 through stage 4 chronic kidney disease, or unspecified chronic kidney disease: Secondary | ICD-10-CM | POA: Insufficient documentation

## 2023-04-06 DIAGNOSIS — Y92009 Unspecified place in unspecified non-institutional (private) residence as the place of occurrence of the external cause: Secondary | ICD-10-CM | POA: Diagnosis not present

## 2023-04-06 DIAGNOSIS — Z951 Presence of aortocoronary bypass graft: Secondary | ICD-10-CM | POA: Insufficient documentation

## 2023-04-06 DIAGNOSIS — R0989 Other specified symptoms and signs involving the circulatory and respiratory systems: Secondary | ICD-10-CM | POA: Diagnosis not present

## 2023-04-06 DIAGNOSIS — S0083XA Contusion of other part of head, initial encounter: Secondary | ICD-10-CM | POA: Insufficient documentation

## 2023-04-06 DIAGNOSIS — S199XXA Unspecified injury of neck, initial encounter: Secondary | ICD-10-CM | POA: Diagnosis not present

## 2023-04-06 DIAGNOSIS — I700 Atherosclerosis of aorta: Secondary | ICD-10-CM | POA: Diagnosis not present

## 2023-04-06 DIAGNOSIS — S0990XA Unspecified injury of head, initial encounter: Secondary | ICD-10-CM

## 2023-04-06 DIAGNOSIS — S161XXA Strain of muscle, fascia and tendon at neck level, initial encounter: Secondary | ICD-10-CM | POA: Diagnosis not present

## 2023-04-06 DIAGNOSIS — J90 Pleural effusion, not elsewhere classified: Secondary | ICD-10-CM | POA: Diagnosis not present

## 2023-04-06 DIAGNOSIS — W19XXXA Unspecified fall, initial encounter: Secondary | ICD-10-CM | POA: Diagnosis not present

## 2023-04-06 DIAGNOSIS — I10 Essential (primary) hypertension: Secondary | ICD-10-CM | POA: Diagnosis not present

## 2023-04-06 DIAGNOSIS — R918 Other nonspecific abnormal finding of lung field: Secondary | ICD-10-CM | POA: Diagnosis not present

## 2023-04-06 DIAGNOSIS — M4312 Spondylolisthesis, cervical region: Secondary | ICD-10-CM | POA: Diagnosis not present

## 2023-04-06 LAB — BASIC METABOLIC PANEL
Anion gap: 12 (ref 5–15)
BUN: 75 mg/dL — ABNORMAL HIGH (ref 8–23)
CO2: 28 mmol/L (ref 22–32)
Calcium: 9.2 mg/dL (ref 8.9–10.3)
Chloride: 98 mmol/L (ref 98–111)
Creatinine, Ser: 3.54 mg/dL — ABNORMAL HIGH (ref 0.61–1.24)
GFR, Estimated: 17 mL/min — ABNORMAL LOW (ref >60)
Glucose, Bld: 94 mg/dL (ref 70–99)
Potassium: 2.7 mmol/L — CL (ref 3.5–5.1)
Sodium: 138 mmol/L (ref 135–145)

## 2023-04-06 LAB — CBC
HCT: 41 % (ref 39.0–52.0)
Hemoglobin: 13.3 g/dL (ref 13.0–17.0)
MCH: 33.8 pg (ref 26.0–34.0)
MCHC: 32.4 g/dL (ref 30.0–36.0)
MCV: 104.1 fL — ABNORMAL HIGH (ref 80.0–100.0)
Platelets: 204 10*3/uL (ref 150–400)
RBC: 3.94 MIL/uL — ABNORMAL LOW (ref 4.22–5.81)
RDW: 14.5 % (ref 11.5–15.5)
WBC: 9.8 10*3/uL (ref 4.0–10.5)
nRBC: 0 % (ref 0.0–0.2)

## 2023-04-06 MED ORDER — POTASSIUM CHLORIDE 10 MEQ/100ML IV SOLN
10.0000 meq | Freq: Once | INTRAVENOUS | Status: AC
  Administered 2023-04-06: 10 meq via INTRAVENOUS
  Filled 2023-04-06: qty 100

## 2023-04-06 MED ORDER — METHOCARBAMOL 500 MG PO TABS: 500.0000 mg | ORAL_TABLET | Freq: Two times a day (BID) | ORAL | 0 refills | Status: DC

## 2023-04-06 MED ORDER — LIDOCAINE 5 % EX PTCH: 1.0000 | MEDICATED_PATCH | CUTANEOUS | 0 refills | Status: DC

## 2023-04-06 MED ORDER — POTASSIUM CHLORIDE CRYS ER 20 MEQ PO TBCR
40.0000 meq | EXTENDED_RELEASE_TABLET | Freq: Once | ORAL | Status: AC
  Administered 2023-04-06: 40 meq via ORAL
  Filled 2023-04-06: qty 2

## 2023-04-06 MED ORDER — ACETAMINOPHEN 325 MG PO TABS: 650.0000 mg | ORAL_TABLET | Freq: Once | ORAL | Status: DC

## 2023-04-06 NOTE — Progress Notes (Signed)
Level 2 trauma for fall on thinners. Chaplain offers compassionate presence. No family present.

## 2023-04-06 NOTE — ED Provider Notes (Signed)
EMERGENCY DEPARTMENT AT [facility] Provider Note   CSN: [id number] Arrival date & time: 04/06/23  4098     History  Chief Complaint  Patient presents with   Marletta Lor    Christian [name] is a 77 y.o. male with past medical history significant for hypertension, hyperlipidemia, diabetes with polyneuropathy, CKD stage IV, memory impairment, previous PE, previous CABG who is currently taking Plavix who presents with concern for fall of unclear etiology while at home this morning.  Patient reports that he normally wakes up at around 6 AM, woke up later than normal, was feeling possibly dizzy or off balance although he cannot quite remember.  After fall he was having difficulty walking with his cane which he can normally ambulate with without significant difficulty.   Fall       Home Medications Prior to Admission medications   Medication Sig Start Date End Date Taking? Authorizing Provider  lidocaine (LIDODERM) 5 % Place 1 patch onto the skin daily. Remove & Discard patch within 12 hours or as directed by MD 04/06/23  Yes Urijah Raynor H, PA-C  methocarbamol (ROBAXIN) 500 MG tablet Take 1 tablet (500 mg total) by mouth 2 (two) times daily. 04/06/23  Yes Marlen Koman H, PA-C  albuterol (VENTOLIN HFA) 108 (90 Base) MCG/ACT inhaler TAKE 2 PUFFS BY MOUTH EVERY 6 HOURS AS NEEDED FOR WHEEZE OR SHORTNESS OF BREATH Patient not taking: Reported on 01/18/2023 04/22/21   Myrlene Broker, MD  atorvastatin (LIPITOR) 20 MG tablet TAKE 1 TABLET BY MOUTH EVERY DAY IN THE EVENING 05/11/22   Custovic, Rozell Searing, DO  budesonide-formoterol (SYMBICORT) 160-4.5 MCG/ACT inhaler INHALE 2 PUFFS INTO THE LUNGS TWICE A DAY 12/09/22   Martina Sinner, MD  clonazePAM (KLONOPIN) 0.5 MG disintegrating tablet TAKE 1 TABLET BY MOUTH TWICE A DAY 03/15/23   Myrlene Broker, MD  clopidogrel (PLAVIX) 75 MG tablet TAKE 1 TABLET BY MOUTH EVERY DAY 11/18/22   Yates Decamp, MD  cromolyn  (OPTICROM) 4 % ophthalmic solution Place 1 drop into both eyes every Wednesday. At bedtime 12/08/19   [provider]  [employer] ACETAMINOPHEN 325 MG tablet TAKE 2 CAPS BY MOUTH EVERY 6 HOURS AS NEEDED FOR PAIN FOR TEMP 100F OR ABOVE Patient taking differently: Take 325 mg by mouth every 6 (six) hours as needed for mild pain (pain score 1-3) or headache (temp over 100). 02/23/20   Myrlene Broker, MD  [employer] SENNA PLUS 8.6-50 MG tablet TAKE 2 TABLETS BY MOUTH AT BEDTIME FOR CONSTIPATION 08/14/21   Myrlene Broker, MD  ferrous sulfate 325 (65 FE) MG tablet Take 325 mg by mouth daily with breakfast.    [provider]  fluticasone (FLONASE) 50 MCG/ACT nasal spray SPRAY 1 SPRAY INTO EACH NOSTRIL EVERY DAY 05/11/22   Martina Sinner, MD  furosemide (LASIX) 80 MG tablet Take 120 mg by mouth 2 (two) times daily. 11/30/20   [provider]  glimepiride (AMARYL) 2 MG tablet TAKE 1 TABLET BY MOUTH DAILY BEFORE BREAKFAST. 02/19/23   Myrlene Broker, MD  hydrALAZINE (APRESOLINE) 100 MG tablet TAKE 1 TABLET BY MOUTH 3 TIMES DAILY. 10/12/22   Yates Decamp, MD  isosorbide dinitrate (ISORDIL) 30 MG tablet TAKE 1 TABLET BY MOUTH 3 TIMES DAILY. 12/14/22   Myrlene Broker, MD  metolazone (ZAROXOLYN) 5 MG tablet Take 5 mg by mouth 2 (two) times a week.    [provider]  metoprolol succinate (TOPROL-XL) 50 MG 24 hr  tablet TAKE 3 TABLETS (150 MG TOTAL) BY MOUTH DAILY. TAKE WITH OR IMMEDIATELY FOLLOWING A MEAL. 11/16/22   Yates Decamp, MD  Multiple Vitamin (MULTIVITAMIN) tablet Take 1 tablet by mouth daily.    [provider]  nitroGLYCERIN (NITROSTAT) 0.4 MG SL tablet Place 0.4 mg under the tongue every 5 (five) minutes as needed for chest pain. 10/25/19   [provider]  potassium chloride (KLOR-CON) 10 MEQ tablet Take 30 mEq by mouth daily. 10/25/20   [provider]  PREVIDENT 5000 BOOSTER PLUS 1.1 % PSTE TAKE 1 APPLICATION BY MOUTH EVERY EVENING.  09/14/22   Myrlene Broker, MD  sertraline (ZOLOFT) 25 MG tablet TAKE 1 TABLET BY MOUTH EVERY DAY 10/26/22   Myrlene Broker, MD  silodosin (RAPAFLO) 8 MG CAPS capsule Take 1 capsule (8 mg total) by mouth at bedtime. 09/25/21   McKenzie, Mardene Celeste, MD  Vitamin D, Ergocalciferol, (DRISDOL) 1.25 MG (50000 UNIT) CAPS capsule TAKE 1 CAPSULE (50,000 UNITS TOTAL) BY MOUTH EVERY 7 (SEVEN) DAYS 02/19/23   Myrlene Broker, MD      Allergies    Patient has no known allergies.    Review of Systems   Review of Systems  All other systems reviewed and are negative.   Physical Exam Updated Vital Signs BP (!) 160/74   Pulse 86   Temp 97.9 F (36.6 C) (Oral)   Resp 19   Ht 5\' 9"  (1.753 m)   Wt 92 kg   SpO2 98%   BMI 29.95 kg/m  Physical Exam Vitals and nursing note reviewed.  Constitutional:      General: He is not in acute distress.    Appearance: Normal appearance.  HENT:     Head: Normocephalic and atraumatic.     Mouth/Throat:     Comments: Small laceration versus abrasion noted to anterior of right upper lip, not significantly dehisced, active bleeding at my evaluation.  No teeth are loose. Eyes:     General:        Right eye: No discharge.        Left eye: No discharge.  Cardiovascular:     Rate and Rhythm: Normal rate and regular rhythm.     Heart sounds: No murmur heard.    No friction rub. No gallop.  Pulmonary:     Effort: Pulmonary effort is normal.     Breath sounds: Normal breath sounds.  Abdominal:     General: Bowel sounds are normal.     Palpations: Abdomen is soft.  Musculoskeletal:     Comments: Tenderness to palpation in the cervical spine and paraspinous region, with intact range of motion to flexion, extension, lateral rotation secondary to pain after removal of the c-collar.  He has intact strength 5/5 to flexion, extension of bilateral upper and lower extremities  No tenderness to palpation of the chest, pelvis, upper or lower extremities, no  deformities noted on exam  Skin:    General: Skin is warm and dry.     Capillary Refill: Capillary refill takes less than 2 seconds.     Comments: Hematoma noted on right forehead without bleeding or laceration  Neurological:     Mental Status: He is alert and oriented to person, place, and time.  Psychiatric:        Mood and Affect: Mood normal.        Behavior: Behavior normal.     ED Results / Procedures / Treatments   Labs (all labs ordered are  listed, but only abnormal results are displayed) Labs Reviewed  CBC - Abnormal; Notable for the following components:      Result Value   RBC 3.94 (*)    MCV 104.1 (*)    All other components within normal limits  BASIC METABOLIC PANEL - Abnormal; Notable for the following components:   Potassium 2.7 (*)    BUN 75 (*)    Creatinine, Ser 3.54 (*)    GFR, Estimated 17 (*)    All other components within normal limits    EKG None  Radiology CT Cervical Spine Wo Contrast  Result Date: 04/06/2023 CLINICAL DATA:  Neck trauma EXAM: CT CERVICAL SPINE WITHOUT CONTRAST TECHNIQUE: Multidetector CT imaging of the cervical spine was performed without intravenous contrast. Multiplanar CT image reconstructions were also generated. RADIATION DOSE REDUCTION: This exam was performed according to the departmental dose-optimization program which includes automated exposure control, adjustment of the mA and/or kV according to patient size and/or use of iterative reconstruction technique. COMPARISON:  08/06/2022 FINDINGS: Alignment: Chronic degenerative anterolisthesis at C2-3 of 3-4 mm and C7-T1 of 4 mm. Facet joints at C7-T1 are fused. Skull base and vertebrae: No regional fracture. Chronic fusion of the vertebral bodies and facet joints at C3-4 and C5-6. Developing fusion of the vertebral bodies at C6-7. Fusion of the facets at C7-T1. Soft tissues and spinal canal: No traumatic soft tissue finding. Disc levels: The foramen magnum is widely patent. There  is ordinary mild osteoarthritis of the C1-2 articulation but no encroachment upon the neural structures. C2-3: Advanced bilateral facet arthropathy allowing the anterolisthesis of 3-4 mm. Mild canal narrowing. Bilateral foraminal stenosis. C3-4: Chronic fusion.  Sufficient patency of the canal and foramina. C4-5: Shallow protrusion of disc with disc calcification. Mild canal stenosis. Facet osteoarthritis on the left. Bony stenosis of the left foramen. C5-6: Chronic fusion of the vertebral bodies and facet joints. No stenosis. C6-7: Fusion or near fusion of the vertebral bodies. Chronic bony foraminal narrowing, left worse than right. C7-T1: Facet fusion. Fixed anterolisthesis of 3 mm. Chronic bilateral foraminal narrowing. Upper chest: Negative other than aortic atherosclerosis and pleural scarring or a small amount of pleural fluid on the left. Other: None IMPRESSION: 1. No acute or traumatic finding. Chronic fusion of the vertebral bodies and facet joints at C3-4 and C5-6. Developing fusion of the vertebral bodies at C6-7. Fusion of the facets at C7-T1. Chronic degenerative anterolisthesis at C2-3 of 3-4 mm and C7-T1 of 3 mm. 2. Aortic atherosclerosis. 3. Pleural scarring or a small amount of pleural fluid on the left. Aortic Atherosclerosis (ICD10-I70.0). Electronically Signed   By: Paulina Fusi M.D.   On: 04/06/2023 10:52   CT Head Wo Contrast  Result Date: 04/06/2023 CLINICAL DATA:  Head trauma, minor EXAM: CT HEAD WITHOUT CONTRAST TECHNIQUE: Contiguous axial images were obtained from the base of the skull through the vertex without intravenous contrast. RADIATION DOSE REDUCTION: This exam was performed according to the departmental dose-optimization program which includes automated exposure control, adjustment of the mA and/or kV according to patient size and/or use of iterative reconstruction technique. COMPARISON:  MRI 10/09/2022.  CT 08/06/2022. FINDINGS: Brain: Age related volume loss. No sign of  acute infarction, mass lesion, hemorrhage, hydrocephalus or extra-axial collection. Ventriculomegaly is approximately in proportion to the degree of generalized brain atrophy. Vascular: There is atherosclerotic calcification of the major vessels at the base of the brain. Skull: Negative.  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 Sinuses/Orbits: Clear/normal Other: None IMPRESSION: No acute  or traumatic finding. Age related volume loss. Ventriculomegaly approximately in proportion to the degree of generalized brain atrophy. Electronically Signed   By: Paulina Fusi M.D.   On: 04/06/2023 10:47   DG Chest Portable 1 View  Result Date: 04/06/2023 CLINICAL DATA:  Unwitnessed fall.  Difficulty walking. EXAM: PORTABLE CHEST 1 VIEW COMPARISON:  05/22/2021. FINDINGS: Low lung volume. There is redemonstration of homogeneous opacity along the left lateral chest wall with associated left retrocardiac opacity and blunting of left lateral costophrenic angle, compatible with combination of rounded atelectasis and pleural effusion seen on multiple prior studies including CT scan from 12/12/2020. Bilateral lung fields are otherwise clear. No acute consolidation or lung collapse. Right lateral costophrenic angle is clear. Moderately enlarged cardio-mediastinal silhouette, which is likely accentuated by low lung volume and AP technique. No acute osseous abnormalities. No acute displaced rib fracture seen. Metallic hardware again seen overlying the thoracic spine which corresponds to sternal ORIF. The soft tissues are within normal limits. IMPRESSION: *Moderately enlarged cardiomediastinal silhouette, which is likely accentuated by low lung volume and AP technique. *Redemonstration of combination of rounded atelectasis and pleural effusion in the left hemithorax, unchanged since multiple prior studies. No acute pulmonary abnormality identified. Electronically Signed   By: Jules Schick M.D.   On: 04/06/2023 10:30     Procedures Procedures    Medications Ordered in ED Medications  acetaminophen (TYLENOL) tablet 650 mg (650 mg Oral Patient Refused/Not Given 04/06/23 0915)  potassium chloride SA (KLOR-CON M) CR tablet 40 mEq (40 mEq Oral Given 04/06/23 1116)  potassium chloride 10 mEq in 100 mL IVPB (0 mEq Intravenous Stopped 04/06/23 1204)    ED Course/ Medical Decision Making/ A&P                                 Medical Decision Making Amount and/or Complexity of Data Reviewed Labs: ordered. Radiology: ordered.  Risk OTC drugs. Prescription drug management.   This patient is a 77 y.o. male  who presents to the ED for concern of fall vs possible syncope, weakness.   Differential diagnoses prior to evaluation: The emergent differential diagnosis includes, but is not limited to,  epidural hematoma, subdural hematoma, skull fracture, subarachnoid hemorrhage, unstable cervical spine fracture, concussion vs other MSK injury, CVA, spinal cord injury, ACS, arrhythmia, syncope, orthostatic hypotension, sepsis, hypoglycemia, hypoxia, electrolyte disturbance, endocrine disorder, anemia, environmental exposure, polypharmacy . This is not an exhaustive differential.   Past Medical History / Co-morbidities / Social History: hypertension, hyperlipidemia, diabetes with polyneuropathy, CKD stage IV, memory impairment, previous PE, previous CABG who is currently taking Plavix   Additional history: Chart reviewed. Pertinent results include: Reviewed lab work, imaging from previous emergency department visits, admissions, outpatient cardiology visits  Physical Exam: Physical exam performed. The pertinent findings include: Patient with some tenderness in the area around cervical spine, but intact strength of bilateral upper and lower extremities and can ambulate without difficulty.  Normal range of motion of the C-spine.  Lab Tests/Imaging studies: I personally interpreted labs/imaging and the pertinent  results include: Independent interpreted CT C-spine, CT head and plain from chest x-ray, although patient has significant degenerative changes already in the cervical spine no acute fracture, dislocation, no intracranial bleed or other injury.  Plain film radiograph of the chest without any acute intrathoracic abnormality.  CBC is overall unremarkable, BMP notable for around stable CKD, BUN, creatinine 75, 3.54 respectively, his potassium is very low at  2.7, we will replete both orally and with IV potassium. I agree with the radiologist interpretation.  Cardiac monitoring: EKG obtained and interpreted by myself and attending physician which shows: Normal sinus rhythm with nonspecific T wave abnormality in anterior leads.   Medications: I ordered medication including oral and IV potassium for hypokalemia.  I have reviewed the patients home medicines and have made adjustments as needed.   Disposition: After consideration of the diagnostic results and the patients response to treatment, I feel that patient was able to ambulate, is not having any ongoing feelings of weakness, I will have him closely follow-up with his primary care doctor in regards to his potassium, considered hospital admission for observation given fall, hypokalemia, weakness but patient requests discharge and is able to ambulate without significant difficulty, I think reasonable to allow him to go home and follow-up closely with PCP, he will take his home potassium as prescribed.   emergency department workup does not suggest an emergent condition requiring admission or immediate intervention beyond what has been performed at this time. The plan is: As above. The patient is safe for discharge and has been instructed to return immediately for worsening symptoms, change in symptoms or any other concerns.  Final Clinical Impression(s) / ED Diagnoses Final diagnoses:  Fall, initial encounter  Injury of head, initial encounter  Strain of  neck muscle, initial encounter  Hypokalemia    Rx / DC Orders ED Discharge Orders          Ordered    lidocaine (LIDODERM) 5 %  Every 24 hours        04/06/23 1220    methocarbamol (ROBAXIN) 500 MG tablet  2 times daily        04/06/23 1220              Montel Clock Breaks, PA-C 04/06/23 1241    Gerhard Munch, MD 04/07/23 [phone]

## 2023-04-06 NOTE — Discharge Instructions (Addendum)
Please use Tylenol for pain.  You may use 1000 mg of Tylenol every 6 hours.  Not to exceed 4 g of Tylenol within 24 hours.  You can additionally use the lidocaine patches that I prescribed directly where it hurts.  I expect that the pain in your neck may get worse before gets better tomorrow but then it should start to improve given pain medicine and rest.   You can use the muscle relaxant I am prescribing in addition to the above to help with any breakthrough pain.  You can take it up to twice daily.  It is safe to take at night, but I would be cautious taking it during the day as it can cause some drowsiness.  Make sure that you are feeling awake and alert before you get behind the wheel of a car or operate a motor vehicle.   Please continue to take your oral potassium and follow up in 1-2 weeks with your PCP for a recheck of your potassium.

## 2023-04-06 NOTE — ED Triage Notes (Signed)
Unwitnessed fall at home. On Plavix. Walks with a walker. Difficulty walking since the fall. C-collar in place. Pt reports neck and headache. No LOC.

## 2023-04-07 ENCOUNTER — Ambulatory Visit (INDEPENDENT_AMBULATORY_CARE_PROVIDER_SITE_OTHER): Payer: Medicare HMO | Admitting: Podiatry

## 2023-04-07 ENCOUNTER — Encounter: Payer: Self-pay | Admitting: Podiatry

## 2023-04-07 ENCOUNTER — Telehealth: Payer: Self-pay

## 2023-04-07 VITALS — Ht 69.0 in | Wt 202.8 lb

## 2023-04-07 DIAGNOSIS — B351 Tinea unguium: Secondary | ICD-10-CM | POA: Diagnosis not present

## 2023-04-07 DIAGNOSIS — E1142 Type 2 diabetes mellitus with diabetic polyneuropathy: Secondary | ICD-10-CM | POA: Diagnosis not present

## 2023-04-07 DIAGNOSIS — M79675 Pain in left toe(s): Secondary | ICD-10-CM

## 2023-04-07 DIAGNOSIS — M79674 Pain in right toe(s): Secondary | ICD-10-CM | POA: Diagnosis not present

## 2023-04-07 NOTE — Transitions of Care (Post Inpatient/ED Visit) (Signed)
   04/07/2023  Name: Christian [name] MRN: [id number] DOB: 06/15/1945  Today's TOC FU Call Status: Today's TOC FU Call Status:: Unsuccessful Call (1st Attempt) Unsuccessful Call (1st Attempt) Date: 04/07/23  Attempted to reach the patient regarding the most recent Inpatient/ED visit.  Follow Up Plan: Additional outreach attempts will be made to reach the patient to complete the Transitions of Care (Post Inpatient/ED visit) call.   Signature Karena Addison, LPN [facility] Nurse Health Advisor Direct Dial [phone]

## 2023-04-12 ENCOUNTER — Encounter: Payer: Self-pay | Admitting: Internal Medicine

## 2023-04-12 ENCOUNTER — Ambulatory Visit (INDEPENDENT_AMBULATORY_CARE_PROVIDER_SITE_OTHER): Payer: Medicare HMO | Admitting: Internal Medicine

## 2023-04-12 VITALS — BP 142/68 | HR 98 | Temp 98.2°F | Ht 69.0 in | Wt 206.0 lb

## 2023-04-12 DIAGNOSIS — E876 Hypokalemia: Secondary | ICD-10-CM

## 2023-04-12 DIAGNOSIS — E1142 Type 2 diabetes mellitus with diabetic polyneuropathy: Secondary | ICD-10-CM | POA: Diagnosis not present

## 2023-04-12 DIAGNOSIS — Z7984 Long term (current) use of oral hypoglycemic drugs: Secondary | ICD-10-CM | POA: Diagnosis not present

## 2023-04-12 DIAGNOSIS — N184 Chronic kidney disease, stage 4 (severe): Secondary | ICD-10-CM | POA: Diagnosis not present

## 2023-04-12 LAB — RENAL FUNCTION PANEL
Albumin: 3.9 g/dL (ref 3.5–5.2)
BUN: 68 mg/dL — ABNORMAL HIGH (ref 6–23)
CO2: 26 meq/L (ref 19–32)
Calcium: 9.5 mg/dL (ref 8.4–10.5)
Chloride: 103 meq/L (ref 96–112)
Creatinine, Ser: 3.17 mg/dL — ABNORMAL HIGH (ref 0.40–1.50)
GFR: 18.36 mL/min — ABNORMAL LOW (ref >60.00)
Glucose, Bld: 88 mg/dL (ref 70–99)
Phosphorus: 3.1 mg/dL (ref 2.3–4.6)
Potassium: 3.5 meq/L (ref 3.5–5.1)
Sodium: 141 meq/L (ref 135–145)

## 2023-04-12 LAB — MAGNESIUM: Magnesium: 2.1 mg/dL (ref 1.5–2.5)

## 2023-04-12 LAB — HEMOGLOBIN A1C: Hgb A1c MFr Bld: 7.3 % — ABNORMAL HIGH (ref 4.6–6.5)

## 2023-04-12 NOTE — Patient Instructions (Signed)
We will recheck the labs.

## 2023-04-12 NOTE — Progress Notes (Unsigned)
   Subjective:   Patient ID: Christian [name], male    DOB: [birth year], 77 y.o.   MRN: [id number]  HPI The patient is a 77 YO man comig in for ER follow up (fall with uncertain etiology with head injury, reviewed labs and imaging from ER visit). He was discharged to follow up. Overall stable since discharge. Denies new falls. He has no idea what happened. They have some disagreements about him taking his medications. Wife manages medications and sometimes he does not agree to take.   Review of Systems  Constitutional:  Positive for activity change and fatigue.  HENT: Negative.    Eyes: Negative.   Respiratory:  Negative for cough, chest tightness and shortness of breath.   Cardiovascular:  Negative for chest pain, palpitations and leg swelling.  Gastrointestinal:  Negative for abdominal distention, abdominal pain, constipation, diarrhea, nausea and vomiting.  Musculoskeletal:  Positive for arthralgias and myalgias.  Skin: Negative.   Neurological:  Positive for weakness.  Psychiatric/Behavioral: Negative.      Objective:  Physical Exam Constitutional:      Appearance: He is well-developed.  HENT:     Head: Normocephalic and atraumatic.  Cardiovascular:     Rate and Rhythm: Normal rate and regular rhythm.  Pulmonary:     Effort: Pulmonary effort is normal. No respiratory distress.     Breath sounds: Normal breath sounds. No wheezing or rales.  Abdominal:     General: Bowel sounds are normal. There is no distension.     Palpations: Abdomen is soft.     Tenderness: There is no abdominal tenderness. There is no rebound.  Musculoskeletal:     Cervical back: Normal range of motion.  Skin:    General: Skin is warm and dry.  Neurological:     Mental Status: He is alert.     Coordination: Coordination normal.     Comments: Distracted during visit  Psychiatric:     Comments: Wife with frustration with patient during visit     Vitals:   04/12/23 1030  BP: (!) 142/68  Pulse: 98   Temp: 98.2 F (36.8 C)  TempSrc: Oral  SpO2: 96%  Weight: 206 lb (93.4 kg)  Height: 5\' 9"  (1.753 m)    Assessment & Plan:  Visit time 25 minutes in face to face communication with patient and coordination of care, additional 5 minutes spent in record review, coordination or care, ordering tests, communicating/referring to other healthcare professionals, documenting in medical records all on the same day of the visit for total time 30 minutes spent on the visit.

## 2023-04-13 NOTE — Transitions of Care (Post Inpatient/ED Visit) (Signed)
   04/13/2023  Name: SEAMON BRUEGGEMANN MRN: [id number] DOB: Jul 04, 1945  Today's TOC FU Call Status: Today's TOC FU Call Status:: Unsuccessful Call (1st Attempt) Unsuccessful Call (1st Attempt) Date: 04/07/23  Attempted to reach the patient regarding the most recent Inpatient/ED visit.  Follow Up Plan: No further outreach attempts will be made at this time. We have been unable to contact the patient. Patient already seen in office Signature  Karena Addison, LPN [facility] Nurse Health Advisor Direct Dial [phone]

## 2023-04-15 ENCOUNTER — Encounter: Payer: Self-pay | Admitting: Internal Medicine

## 2023-04-15 NOTE — Assessment & Plan Note (Signed)
Checking HGA1c and adjust amaryl 2 mg daily as needed. No low sugars. On statin.

## 2023-04-15 NOTE — Progress Notes (Signed)
  Subjective:  Patient ID: Christian [name], male    DOB: [birth year],  MRN: [id number]  Christian [name] presents to clinic today for at risk foot care with history of diabetic neuropathy and painful thick toenails that are difficult to trim. Pain interferes with ambulation. Aggravating factors include wearing enclosed shoe gear. Pain is relieved with periodic professional debridement.  Chief Complaint  Patient presents with   Nail Problem    Pt is here for [facility], last A1C was 9.2 PCP is Dr Okey Dupre and LOV was in September.   New problem(s): None.   PCP is Myrlene Broker, MD.  No Known Allergies  Review of Systems: Negative except as noted in the HPI.  Objective: No changes noted in today's physical examination. There were no vitals filed for this visit. Christian [name] is a pleasant 77 y.o. male obese in NAD. AAO x 3.  Vascular Examination: Capillary refill time immediate b/l. Vascular status intact b/l with palpable DP pulses; faintly palpable PT pulses. Pedal hair diminished b/l.Nonpitting edema present b/l. No pain with calf compression b/l. Skin temperature gradient WNL b/l. No cyanosis or clubbing noted b/l. No ischemia or gangrene b/l.   Neurological Examination: Protective sensation intact with 10 gram monofilament right foot. Protective sensation diminished with 10 gram monofilament left foot.  Dermatological Examination: Pedal skin with normal turgor, texture and tone b/l.  No open wounds. No interdigital macerations.   Toenails 1-5 right, 3-5 left thick, discolored, elongated with subungual debris and pain on dorsal palpation.   Anonychia noted left great toe and L 2nd toe. Nailbed(s) epithelialized.  No corns, calluses nor porokeratotic lesions noted.  Musculoskeletal Examination: Muscle strength 5/5 to all lower extremity muscle groups bilaterally. Pes planus deformity noted bilateral LE. Utilizes cane for ambulation assistance.  Radiographs: None  Last A1c:       Latest Ref Rng & Units 04/12/2023   11:22 AM 01/18/2023   11:18 AM 07/14/2022    4:39 PM  Hemoglobin A1C  Hemoglobin-A1c 4.6 - 6.5 % 7.3  9.5  7.9    Assessment/Plan: 1. Pain due to onychomycosis of toenails of both feet   2. Diabetic polyneuropathy associated with type 2 diabetes mellitus (HCC)     -Consent given for treatment as described below: -Examined patient. -Continue foot and shoe inspections daily. Monitor blood glucose per PCP/Endocrinologist's recommendations. -Patient to continue soft, supportive shoe gear daily. -Toenails were debrided in length and girth 1-5 right foot and 3-5 right foot with sterile nail nippers and dremel without iatrogenic bleeding.  -Patient/POA to call should there be question/concern in the interim.   Return in about 3 months (around 07/06/2023).  Freddie Breech, DPM      [facility] LOCATION: [address], [place] [address]                   Office [phone]   [facility] LOCATION: [address] [place], [place] [address] Office [phone]

## 2023-04-15 NOTE — Assessment & Plan Note (Signed)
BP is high today and his compliance with medications is fair. His wife is frustrated that he does not always take meds or on time and they have a hard time communicating about this. I suspect they would benefit from marital counseling and that this dynamic has existed for some time. We discussed again that not taking medications as prescribed will shorten his lifetime but this is his choice. He does understand the seriousness of this.

## 2023-05-02 ENCOUNTER — Other Ambulatory Visit: Payer: Self-pay | Admitting: Internal Medicine

## 2023-05-02 DIAGNOSIS — I119 Hypertensive heart disease without heart failure: Secondary | ICD-10-CM

## 2023-05-11 ENCOUNTER — Other Ambulatory Visit: Payer: Self-pay

## 2023-05-11 MED ORDER — METOPROLOL SUCCINATE ER 50 MG PO TB24: 150.0000 mg | ORAL_TABLET | Freq: Every day | ORAL | 3 refills | Status: DC

## 2023-05-13 DIAGNOSIS — Z008 Encounter for other general examination: Secondary | ICD-10-CM | POA: Diagnosis not present

## 2023-05-14 ENCOUNTER — Telehealth: Payer: Self-pay

## 2023-05-14 NOTE — Progress Notes (Signed)
Care Guide Pharmacy Note  05/14/2023 Name: [name] MRN: [id number] DOB: 1946/02/16  Referred By: Myrlene Broker, MD Reason for referral: Care Coordination ([facility] Diabetes. )   Christian [name] is a 78 y.o. year old male who is a primary care patient of Myrlene Broker, MD.  [name] was referred to the pharmacist for assistance related to: DMII  Successful contact was made with the patient to discuss pharmacy services including being ready for the pharmacist to call at least 5 minutes before the scheduled appointment time and to have medication bottles and any blood pressure readings ready for review. The patient agreed to meet with the pharmacist via telephone visit on (date/time). 05/26/23 at 2:30 p.m.  [facility]  [facility], [facility] Health Care Management Assistant Direct Dial: [phone]  Fax: [phone]

## 2023-05-18 DIAGNOSIS — N184 Chronic kidney disease, stage 4 (severe): Secondary | ICD-10-CM | POA: Diagnosis not present

## 2023-05-26 ENCOUNTER — Other Ambulatory Visit: Payer: Medicare HMO | Admitting: Pharmacist

## 2023-05-26 DIAGNOSIS — E1142 Type 2 diabetes mellitus with diabetic polyneuropathy: Secondary | ICD-10-CM

## 2023-05-26 NOTE — Progress Notes (Signed)
   05/26/2023 Name: Christian [name] MRN: [id number] DOB: [birth year]  Chief Complaint  Patient presents with   [place] Metric Diabetes    Christian [name] is a 78 y.o. year old male who presented for a telephone visit.   They were referred to the pharmacist by a quality report for assistance in managing diabetes.   Subjective:  Care Team: Primary Care Provider: Myrlene Broker, MD ; Next Scheduled Visit: not scheduled  Medication Access/Adherence  Current Pharmacy:  [id number] - [place], [place] - [address] AT [facility] [address] [place] [address] Phone: [phone] Fax: [phone]   Patient reports affordability concerns with their medications: No  Patient reports access/transportation concerns to their pharmacy: No  Patient reports adherence concerns with their medications:  No  Pt expresses that he does not like taking all the medications he is prescribed. Pt's wife is also on the call. She helps him with the medications.   Diabetes:  Current medications: glimepiride 4 mg daily   Current meal patterns: Pt/pt's wife notes he did change his diet after high A1c reading in September. Stopped eating chocolate as much was the main change per patient.  Fall/foot pain: Pt reports 2 falls last week. One while he was trying to sit on the toilet and another while changing a light bulb. He notes one foot is swollen and bruised on the top of the foot and at the arch. He notes he can wiggle is toes without pain however walking on it is very painful.   Objective:  Lab Results  Component Value Date   HGBA1C 7.3 (H) 04/12/2023    Lab Results  Component Value Date   CREATININE 3.17 (H) 04/12/2023   BUN 68 (H) 04/12/2023   NA 141 04/12/2023   K 3.5 04/12/2023   CL 103 04/12/2023   CO2 26 04/12/2023    Lab Results  Component Value Date   CHOL 140 01/18/2023   HDL 60.60 01/18/2023   LDLCALC 47 01/18/2023    LDLDIRECT 40.0 11/23/2018   TRIG 162.0 (H) 01/18/2023   CHOLHDL 2 01/18/2023    Medications Reviewed Today   Medications were not reviewed in this encounter       Assessment/Plan:   Diabetes: - Currently controlled, A1c <8%. A1c improved 9.5% to 7.3%. - Reviewed long term cardiovascular and renal outcomes of uncontrolled blood sugar - Reviewed goal A1c - Reviewed dietary modifications including continuing to limit sugars/carbs, balanced meals - Recommend to continue current regimen   Fall/Foot pain: Advised pt to elevate foot and apply ice for 20 min a few times per day. If swelling/pain does not improve over the weekend, recommended scheduling an appt with PCP office to be evaluated. Can take tylenol 500 mg 2 tablets up to TID PRN pain (avoid NSAIDs due to CKD)  Follow Up Plan: PRN  Arbutus Leas, PharmD, BCPS, CPP Clinical Pharmacist Practitioner [facility] [phone]

## 2023-05-26 NOTE — Patient Instructions (Signed)
It was a pleasure speaking with you today!  Elevate your foot and apply ice for 20 minutes about 3x per day. Use tylenol 500 mg 2 tablets up to three times per day. Contact the office to schedule an appointment if pain/swelling has not improved by Monday.  Feel free to call with any questions or concerns!  Arbutus Leas, PharmD, BCPS, CPP Clinical Pharmacist Practitioner [facility] [phone]

## 2023-06-03 ENCOUNTER — Encounter: Payer: Self-pay | Admitting: Internal Medicine

## 2023-06-09 ENCOUNTER — Ambulatory Visit (INDEPENDENT_AMBULATORY_CARE_PROVIDER_SITE_OTHER): Payer: Medicare HMO | Admitting: Psychology

## 2023-06-09 ENCOUNTER — Encounter: Payer: Self-pay | Admitting: Psychology

## 2023-06-09 ENCOUNTER — Ambulatory Visit: Payer: Medicare HMO | Admitting: Psychology

## 2023-06-09 ENCOUNTER — Institutional Professional Consult (permissible substitution): Payer: Self-pay | Admitting: Psychology

## 2023-06-09 DIAGNOSIS — F03A0 Unspecified dementia, mild, without behavioral disturbance, psychotic disturbance, mood disturbance, and anxiety: Secondary | ICD-10-CM | POA: Diagnosis not present

## 2023-06-09 DIAGNOSIS — E1142 Type 2 diabetes mellitus with diabetic polyneuropathy: Secondary | ICD-10-CM | POA: Insufficient documentation

## 2023-06-09 DIAGNOSIS — I509 Heart failure, unspecified: Secondary | ICD-10-CM | POA: Insufficient documentation

## 2023-06-09 DIAGNOSIS — R4189 Other symptoms and signs involving cognitive functions and awareness: Secondary | ICD-10-CM

## 2023-06-09 HISTORY — DX: Unspecified dementia, mild, without behavioral disturbance, psychotic disturbance, mood disturbance, and anxiety: F03.A0

## 2023-06-09 NOTE — Progress Notes (Signed)
   Psychometrician Note   Cognitive testing was administered to [name] by Wallace Keller, B.S. (psychometrist) under the supervision of Dr. Newman Nickels, Ph.D., ABPP, licensed psychologist on 06/09/2023. Mr. Thoma did not appear overtly distressed by the testing session per behavioral observation or responses across self-report questionnaires. Rest breaks were offered.    The battery of tests administered was selected by Dr. Newman Nickels, Ph.D., ABPP with consideration to Mr. Alpern current level of functioning, the nature of his symptoms, emotional and behavioral responses during interview, level of literacy, observed level of motivation/effort, and the nature of the referral question. This battery was communicated to the psychometrist. Communication between Dr. Newman Nickels, Ph.D., ABPP and the psychometrist was ongoing throughout the evaluation and Dr. Newman Nickels, Ph.D., ABPP was immediately accessible at all times. Dr. Newman Nickels, Ph.D., ABPP provided supervision to the psychometrist on the date of this service to the extent necessary to assure the quality of all services provided.    AVYUKT CIMO will return within approximately 1-2 weeks for an interactive feedback session with Dr. Milbert Coulter at which time his test performances, clinical impressions, and treatment recommendations will be reviewed in detail. Mr. Goldsborough understands he can contact our office should he require our assistance before this time.  A total of 110 minutes of billable time were spent face-to-face with Mr. Salvucci by the psychometrist. This includes both test administration and scoring time. Billing for these services is reflected in the clinical report generated by Dr. Newman Nickels, Ph.D., ABPP  This note reflects time spent with the psychometrician and does not include test scores or any clinical interpretations made by Dr. Milbert Coulter. The full report will follow in a separate note.

## 2023-06-09 NOTE — Progress Notes (Addendum)
NEUROPSYCHOLOGICAL EVALUATION [facility] [place] Department of Neurology  Date of Evaluation: June 09, 2023  Reason for Referral:   Christian [name] is a 78 y.o. 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   Assessment and Plan:   Clinical Impression(s): Scores across stand-alone and embedded performance validity measures were below expectation. Some of this could certainly be due to true and significant cognitive impairment. However, during the early portions of testing, he did make comments such as his brain was "starting to go out to lunch" and that his brain "had checked out." Across memory testing, he was also quick to report no recollection but then displayed at least some recall with encouragement provided by the psychometrist. Overall some caution is likely prudent when interpreting test performances and taking these at face value.  If taken at face value, Christian [name] pattern of performance is suggestive of prominent impairment surrounding phonemic fluency and all aspects of verbal learning and memory. Further weaknesses were exhibited across processing speed, attention/concentration, executive functioning, semantic fluency, and visuospatial abilities. Performances were appropriate relative to age-matched peers across receptive language, confrontation naming, and retrieval/consolidation aspects of visual memory. Functionally, there are likely ongoing issues with poor medication adherence and some procrastination surrounding bill payments. However, both Christian [name] and his wife described a component of willful choice in these issues rather than them being caused solely by ongoing forgetfulness. He has also been advised to  cease all driving behaviors in the past due to cognitive concerns and medication side effects. Diagnostically, he is likely on the border between mild cognitive impairment and dementia diagnoses. 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 He would likely be towards the more mild end of this spectrum presently.   The underlying cause for ongoing cognitive impairment is unclear, due in combination to validity concerns described above, as well as a somewhat nonspecific pattern of performance across testing. Of note, his problem list within his electronic medical chart mentions multiple sclerosis (MS). However, I am unable to locate any other discussion surrounding this illness nor any spinal or brain MRI which can confirm enhancing MS-specific lesions. During his feedback appointment to discuss these results, he did confirm to me that MS is something he has been dealing with for many years. If truly present, this condition could certainly be playing an active role in his current clinical presentation. However, the extent of this involvement would depend on lesion load within his brain as seen across neuroimaging and his most recent June 2024 MRI did not have any discussion surrounding MS lesions. He also does not appear to be prescribed any current disease modifying medications specific to this illness.   A previous brain MRI in 2020 suggested moderate cerebral atrophy most prominent in the frontal and temporal lobes. This would represent a significant risk factor for underlying frontotemporal lobar degeneration (FTLD). Patterns across testing suggesting verbal memory impairment, phonemic fluency impairment, and further weaknesses surrounding executive functioning would certainly align with this. Behaviorally, Christian [name] wife stopped short of endorsing significant personality changes, suggesting that behavioral/personality concerns have been somewhat  longstanding in nature. However, concerns surrounding a FTLD presentation remain.  Christian [name] wife theorized that he sustained a stroke in the days following his 2021 NSTEMI. However, no prior  infarct has ever been demonstrated on follow-up neuroimaging to my knowledge. As such, I cannot point to any objective information to support this and link cognitive dysfunction to a direct cerebrovascular cause. His most recent brain MRI in in June 2024 did suggest underlying microvascular disease. However, the reading neuroradiologist did not specify the underlying severity, making further interpretation limited. Given his medical history (e.g., NSTEMI, hypertension, cardiomyopathy, coronary artery disease, congestive heart failure, hyperlipidemia, type II diabetes), a vascular contribution of some significance certainly seems plausible.   Underlying Alzheimer's disease in early stages cannot be ruled out. However, visual memory remained strong, as did confrontation naming, which would be an atypical presentation of this illness. Continued medical monitoring moving forward will be important.   Recommendations: Per medical records, Christian [name] has been resistant to any invasive diagnostic procedures. As such, he may not engage in discussion surrounding a lumbar puncture. I do feel that this test would provide useful information surrounding diagnostic clarity. However, if resistance remains, I would recommend that he discuss an FDG-PET scan with Ms. Wertman to better understand FTLD concerns.   A repeat neuropsychological evaluation in 12-18 months could be considered to assess the trajectory of future cognitive decline should it occur. This could aid in future efforts towards improved diagnostic clarity.  Given memory deficits and prior neuroimaging concerns, I would also recommend that Christian [name] discuss medications aimed to address memory loss and concerns surrounding a neurodegenerative illness with Ms.  Wertman. It is important to highlight that these medications have been shown to slow functional decline in some individuals. There is no current treatment which can stop or reverse cognitive decline when caused by a neurodegenerative illness.   Performance across neurocognitive testing is not a strong predictor of an individual's safety operating a motor vehicle. With that being said, I share concerns allegedly held by a previous unspecified therapist surrounding driving safety given cognitive deficits surrounding processing speed, attention/concentration, executive functioning, and visuospatial abilities. It is my recommendation that he continue to fully abstain from driving pursuits pending the completion of a formal driving evaluation. Should he or his family wish to pursue a formalized driving evaluation, they could reach out to the following agencies: [employer] in [place]: [phone] [facility]: [phone] [facility]: [phone] [facility]: [phone] or [phone]  Should there be progression of current deficits over time, Christian [name] is unlikely to regain any independent living skills lost. Therefore, it is recommended that he remain as involved as possible in all aspects of household chores, finances, and medication management, with supervision to ensure adequate performance. He will likely benefit from the establishment and maintenance of a routine in order to maximize his functional abilities over time.  It will be important for Christian [name] to have another person with him when in situations where he may need to process information, weigh the pros and cons of different options, and make decisions, in order to ensure that he fully understands and recalls all information to be considered.  If not already done, Christian [name] and his family may want to discuss his wishes regarding durable power of attorney and medical decision making, so  that he can have input into these choices. If they require legal assistance with this, long-term care resource access, or other aspects of estate planning, they could reach out to [employer] at [phone] for a free consultation.   Christian [name] is encouraged to attend to lifestyle factors for brain health (e.g., regular physical exercise,  good nutrition habits and consideration of the MIND-DASH diet, regular participation in cognitively-stimulating activities, and general stress management techniques), which are likely to have benefits for both emotional adjustment and cognition. Optimal control of vascular risk factors (including safe cardiovascular exercise and adherence to dietary recommendations) is encouraged. Continued participation in activities which provide mental stimulation and social interaction is also recommended.   Important information should be provided to Christian [name] in written format in all instances. This information should be placed in a highly frequented and easily visible location within his home to promote recall. External strategies such as written notes in a consistently used memory journal, visual and nonverbal auditory cues such as a calendar on the refrigerator or appointments with alarm, such as on a cell phone, can also help maximize recall.  Because he shows somewhat better recall for structured information, he may understand and retain new information better if it is presented to him in a meaningful or well-organized manner at the outset, such as grouping items into meaningful categories or presenting information in an outlined, bulleted, or story format.  To address problems with processing speed, he may wish to consider:   -Ensuring that he is alerted when essential material or instructions are being presented   -Adjusting the speed at which new information is presented   -Allowing for more time in comprehending, processing, and responding in conversation    -Repeating and paraphrasing instructions or conversations aloud  To address problems with fluctuating attention and/or executive dysfunction, he may wish to consider:   -Avoiding external distractions when needing to concentrate   -Limiting exposure to fast paced environments with multiple sensory demands   -Writing down complicated information and using checklists   -Attempting and completing one task at a time (i.e., no multi-tasking)   -Verbalizing aloud each step of a task to maintain focus   -Taking frequent breaks during the completion of steps/tasks to avoid fatigue   -Reducing the amount of information considered at one time   -Scheduling more difficult activities for a time of day where he is usually most alert  Review of Records:   Mr. Mirsky was seen by [facility] Marlowe Kays, PA-C) on 08/13/2022 for an evaluation of memory loss. At that time, Mr. Saavedra noted no concerns outside of trouble recalling chess movements since being discharged from the hospital in 2021. His wife noted more prominent short-term memory impairments, including quite significant repetition in day-to-day conversation. She noted these these behaviors are present "all the time" and have been since prior to his 2021 hospitalization. Mr. Swallows did report some stuttering behaviors but alluded to this being somewhat longstanding in nature. Functionally, his wife manages medications and financial responsibilities and Mr. Ballengee no longer drives. Performance on a brief cognitive screening instrument (MOCA) was 15/30. Ultimately, Mr. Maack was referred for a comprehensive neuropsychological evaluation to characterize his cognitive abilities and to assist with diagnostic clarity and treatment planning.   Neuroimaging: Brain MRI on 12/26/2018 revealed moderate atrophy most prominent frontal and temporal lobes, as well as moderate ventricular enlargement most consistent with atrophy. Head CT on 08/26/2019 in the  context of a left-sided weakness was negative for any acute intracranial abnormality. Head CT on 09/07/2019 in the context of left sided paralysis was negative for any acute intracranial abnormality. Head CT on 09/09/2019 was negative. Head CT on 08/06/2022 in the context of suspected head trauma was negative. Brain MRI on 10/18/2022 revealed generalized volume loss of unspecified severity and microvascular ischemic disease of  unspecified severity. Head CT on 04/06/2023 in the context of suspected head/neck trauma was negative.  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   Diabetic polyneuropathy    Dry eyes left   Elevated liver enzymes 08/09/2019   Hiatal hernia    History of coronary artery stent placement    History of pulmonary embolism 08/09/2019   HTN (hypertension) 11/12/2009   Hyperlipidemia associated with type 2 diabetes mellitus 11/12/2009   Incomplete bladder emptying    Ischemic cardiomyopathy    Left leg weakness 11/23/2018   Neck swelling 05/16/2021   Nocturia    NSTEMI (non-ST elevation myocardial infarction) 08/09/2019   tamponade   Pneumonia    as a child   Problems with swallowing    pt states test at [facility] approx 2012 shows a gastric valve  dysfunction--  eats small bites and drink liquids slowly   Pruritus 11/28/2019   Pulmonary edema 10/03/2020   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 LIMA TO LAD, RIMA TO PDA, RADIAL ARTERY TO CIRC AND SEQUENTIALLY TO OM1.   Shortness of breath 11/12/2009   Sleep difficulties 05/30/2020   Swelling of lower extremity    Type II diabetes mellitus 10/10/2014    Past Surgical History:  Procedure  Laterality Date   A/V FISTULAGRAM Left 08/12/2022   Procedure: A/V Fistulagram;  Surgeon: [name], MD;  Location: [facility] INVASIVE CV LAB;  Service: Cardiovascular;  Laterality: Left;   APPLICATION OF WOUND VAC N/A 08/24/2019   Procedure: APPLICATION OF WOUND VAC;  Surgeon: Linden Dolin, MD;  Location: [facility] OR;  Service: Thoracic;  Laterality: N/A;   APPLICATION OF WOUND VAC  08/29/2019   Procedure: Wound Vac change;  Surgeon: Linden Dolin, MD;  Location: [facility] OR;  Service: Open Heart Surgery;;   APPLICATION OF WOUND VAC N/A 09/04/2019   Procedure: Application Of Wound Vac;  Surgeon: Delight Ovens, MD;  Location: [facility] OR;  Service: Open Heart Surgery;  Laterality: N/A;   APPLICATION OF WOUND VAC N/A 09/06/2019   Procedure: APPLICATION OF ACELL, APPLICATION OF WOUND VAC USING PREVENA INCISIONAL  DRESSING;  Surgeon: Peggye Form, DO;  Location: [facility] OR;  Service: Plastics;  Laterality: N/A;   AV FISTULA PLACEMENT Left 09/18/2020   Procedure: ARTERIOVENOUS (AV) FISTULA CREATION LEFT VERSUS GRAFT;  Surgeon: Nada Libman, MD;  Location: [facility] OR;  Service: Vascular;  Laterality: Left;   BASCILIC VEIN TRANSPOSITION Left 10/31/2020   Procedure: LEFT SECOND STAGE BASCILIC VEIN TRANSPOSITION;  Surgeon: Nada Libman, MD;  Location: [facility] OR;  Service: Vascular;  Laterality: Left;   CARDIAC CATHETERIZATION     CORONARY ARTERY BYPASS GRAFT N/A 08/15/2019   Procedure: CORONARY ARTERY BYPASS GRAFTING (CABG), x 4 using bilateral IMAs and left radial artery .  LIMA TO LAD, RIMA TO PDA, RADIAL ARTERY TO CIRC AND SEQUENTIALLY TO OM1.;  Surgeon: Linden Dolin, MD;  Location: [facility] OR;  Service: Open Heart Surgery;  Laterality: N/A;   CORONARY STENT PLACEMENT  02/27/2014   distal rt/pd coronary       dr Jacinto Halim   CYSTO/ BLADDER BIOPSY'S/ CAUTHERIZATION  01-14-2004  DR Patsi Sears  EXPLORATION POST OPERATIVE OPEN HEART N/A 08/16/2019   Procedure: Chest Closure S?P CABG WITH APPLICATION OF PREVENA   INCISIONAL WOUND VAC;  Surgeon: Linden Dolin, MD;  Location: [facility] OR;  Service: Open Heart Surgery;  Laterality: N/A;   EXPLORATION POST OPERATIVE OPEN HEART N/A 08/21/2019   Procedure: CHEST WASHOUT S/P OPEN CHEST;  Surgeon: Linden Dolin, MD;  Location: [facility] OR;  Service: Open Heart Surgery;  Laterality: N/A;  Open chest with Esmark dressing with Ioban sealant coverage.   EXPLORATION POST OPERATIVE OPEN HEART N/A 08/18/2019   Procedure: EXPLORATION POST OPERATIVE OPEN HEART (performed 04/23 on unit);  Surgeon: Linden Dolin, MD;  Location: [facility] OR;  Service: Open Heart Surgery;  Laterality: N/A;   EXPLORATION POST OPERATIVE OPEN HEART N/A 08/24/2019   Procedure: CHEST WASHOUT POST OPERATIVE OPEN HEART;  Surgeon: Linden Dolin, MD;  Location: [facility] OR;  Service: Open Heart Surgery;  Laterality: N/A;   EXPLORATION POST OPERATIVE OPEN HEART N/A 08/29/2019   Procedure: CHEST WOUND WASHOUT POST OPERATIVE OPEN HEART;  Surgeon: Linden Dolin, MD;  Location: [facility] OR;  Service: Open Heart Surgery;  Laterality: N/A;   EXPLORATION POST OPERATIVE OPEN HEART N/A 09/04/2019   Procedure: MEDIASTINAL EXPLORATION WITH STERNAL WOUND IRRIGATION;  Surgeon: Delight Ovens, MD;  Location: [facility] OR;  Service: Open Heart Surgery;  Laterality: N/A;   EXPLORATION POST OPERATIVE OPEN HEART N/A 09/14/2019   Procedure: EVACUATION OF HEMATOMA;  Surgeon: Delight Ovens, MD;  Location: [facility] OR;  Service: Open Heart Surgery;  Laterality: N/A;   IR FLUORO GUIDE CV LINE LEFT  09/19/2019   IR GASTROSTOMY TUBE MOD SED  10/04/2019   IR GASTROSTOMY TUBE REMOVAL  11/29/2019   IR PATIENT EVAL TECH 0-60 MINS  09/29/2019   IR US GUIDE VASC ACCESS LEFT  09/19/2019   LAPAROSCOPIC LYSIS OF ADHESIONS N/A 09/06/2019   Procedure: LAPAROSCOPIC OMENTAL HARVEST;  Surgeon: Rodman Pickle, MD;  Location: [facility] OR;  Service: General;  Laterality: N/A;   LEFT HEART CATH AND CORONARY ANGIOGRAPHY N/A 08/10/2019   Procedure: LEFT HEART CATH AND  CORONARY ANGIOGRAPHY;  Surgeon: Elder Negus, MD;  Location: [facility] INVASIVE CV LAB;  Service: Cardiovascular;  Laterality: N/A;   LEFT HEART CATHETERIZATION WITH CORONARY ANGIOGRAM N/A 02/27/2014   Procedure: LEFT HEART CATHETERIZATION WITH CORONARY ANGIOGRAM;  Surgeon: Pamella Pert, MD;  Location[facility] CATH LAB;  Service: Cardiovascular;  Laterality: N/A;   MEDIASTINAL EXPLORATION N/A 09/06/2019   Procedure: MEDIASTINAL EXPLORATION;  Surgeon: Delight Ovens, MD;  Location: [facility] OR;  Service: Thoracic;  Laterality: N/A;   PECTORALIS FLAP  09/06/2019   Procedure: Pectoralis ADVANCEMENT Flap;  Surgeon: Peggye Form, DO;  Location: [facility] OR;  Service: Plastics;;   PERCUTANEOUS CORONARY STENT INTERVENTION (PCI-S)  02/27/2014   Procedure: PERCUTANEOUS CORONARY STENT INTERVENTION (PCI-S);  Surgeon: Pamella Pert, MD;  Location: [facility] CATH LAB;  Service: Cardiovascular;;  rt PDA  3.0/28mm Promus stent   PERIPHERAL VASCULAR INTERVENTION Left 08/12/2022   Procedure: PERIPHERAL VASCULAR INTERVENTION;  Surgeon: [name], MD;  Location: [facility] INVASIVE CV LAB;  Service: Cardiovascular;  Laterality: Left;   RADIAL ARTERY HARVEST Left 08/15/2019   Procedure: Radial Artery Harvest;  Surgeon: Linden Dolin, MD;  Location: [facility] OR;  Service: Open Heart Surgery;  Laterality: Left;   RIB PLATING N/A 09/06/2019   Procedure: STERNAL PLATING;  Surgeon: Delight Ovens, MD;  Location: [facility] OR;  Service: Thoracic;  Laterality: N/A;   TEE  WITHOUT CARDIOVERSION N/A 08/15/2019   Procedure: TRANSESOPHAGEAL ECHOCARDIOGRAM (TEE);  Surgeon: Linden Dolin, MD;  Location: [facility] OR;  Service: Open Heart Surgery;  Laterality: N/A;   TRACHEOSTOMY TUBE PLACEMENT  08/29/2019   Procedure: Tracheostomy;  Surgeon: Linden Dolin, MD;  Location: [facility] OR;  Service: Open Heart Surgery; Decannulated 6/10/20211   TRANSURETHRAL RESECTION OF PROSTATE  04/04/2012   Procedure: TRANSURETHRAL RESECTION OF THE PROSTATE WITH GYRUS  INSTRUMENTS;  Surgeon: Kathi Ludwig, MD;  Location: [facility];  Service: Urology;  Laterality: N/A;   TRANSURETHRAL RESECTION OF PROSTATE N/A 09/27/2014   Procedure: TRANSURETHRAL RESECTION OF THE PROSTATE ;  Surgeon: Jethro Bolus, MD;  Location: [facility] ORS;  Service: Urology;  Laterality: N/A;   UPPER GASTROINTESTINAL ENDOSCOPY      Current Outpatient Medications:    albuterol (VENTOLIN HFA) 108 (90 Base) MCG/ACT inhaler, TAKE 2 PUFFS BY MOUTH EVERY 6 HOURS AS NEEDED FOR WHEEZE OR SHORTNESS OF BREATH, Disp: 8.5 each, Rfl: 3   atorvastatin (LIPITOR) 20 MG tablet, TAKE 1 TABLET BY MOUTH EVERY DAY IN THE EVENING, Disp: 90 tablet, Rfl: 3   budesonide-formoterol (SYMBICORT) 160-4.5 MCG/ACT inhaler, INHALE 2 PUFFS INTO THE LUNGS TWICE A DAY, Disp: 10.2 g, Rfl: 6   clonazePAM (KLONOPIN) 0.5 MG disintegrating tablet, TAKE 1 TABLET BY MOUTH TWICE A DAY, Disp: 60 tablet, Rfl: 5   clopidogrel (PLAVIX) 75 MG tablet, TAKE 1 TABLET BY MOUTH EVERY DAY, Disp: 90 tablet, Rfl: 2   cromolyn (OPTICROM) 4 % ophthalmic solution, Place 1 drop into both eyes every Wednesday. At bedtime, Disp: , Rfl:    [employer] ACETAMINOPHEN 325 MG tablet, TAKE 2 CAPS BY MOUTH EVERY 6 HOURS AS NEEDED FOR PAIN FOR TEMP 100F OR ABOVE (Patient taking differently: Take 325 mg by mouth every 6 (six) hours as needed for mild pain (pain score 1-3) or headache (temp over 100).), Disp: 60 tablet, Rfl: 0   [employer] SENNA PLUS 8.6-50 MG tablet, TAKE 2 TABLETS BY MOUTH AT BEDTIME FOR CONSTIPATION, Disp: 60 tablet, Rfl: 1   ferrous sulfate 325 (65 FE) MG tablet, Take 325 mg by mouth daily with breakfast., Disp: , Rfl:    fluticasone (FLONASE) 50 MCG/ACT nasal spray, SPRAY 1 SPRAY INTO EACH NOSTRIL EVERY DAY, Disp: 48 mL, Rfl: 3   furosemide (LASIX) 80 MG tablet, Take 120 mg by mouth daily., Disp: , Rfl:    glimepiride (AMARYL) 2 MG tablet, TAKE 1 TABLET BY MOUTH DAILY BEFORE BREAKFAST., Disp: 90 tablet, Rfl: 1   hydrALAZINE  (APRESOLINE) 100 MG tablet, TAKE 1 TABLET BY MOUTH 3 TIMES DAILY., Disp: 270 tablet, Rfl: 3   isosorbide dinitrate (ISORDIL) 30 MG tablet, TAKE 1 TABLET BY MOUTH 3 TIMES DAILY., Disp: 270 tablet, Rfl: 1   lidocaine (LIDODERM) 5 %, Place 1 patch onto the skin daily. Remove & Discard patch within 12 hours or as directed by MD, Disp: 30 patch, Rfl: 0   methocarbamol (ROBAXIN) 500 MG tablet, Take 1 tablet (500 mg total) by mouth 2 (two) times daily., Disp: 20 tablet, Rfl: 0   metolazone (ZAROXOLYN) 5 MG tablet, Take 5 mg by mouth 2 (two) times a week., Disp: , Rfl:    metoprolol succinate (TOPROL-XL) 50 MG 24 hr tablet, Take 3 tablets (150 mg total) by mouth daily. Take with or immediately following a meal., Disp: 270 tablet, Rfl: 3   Multiple Vitamin (MULTIVITAMIN) tablet, Take 1 tablet by mouth daily., Disp: , Rfl:    nitroGLYCERIN (NITROSTAT) 0.4  MG SL tablet, Place 0.4 mg under the tongue every 5 (five) minutes as needed for chest pain., Disp: , Rfl:    potassium chloride (KLOR-CON) 10 MEQ tablet, Take 30 mEq by mouth daily., Disp: , Rfl:    PREVIDENT 5000 BOOSTER PLUS 1.1 % PSTE, TAKE 1 APPLICATION BY MOUTH EVERY EVENING., Disp: 100 g, Rfl: 3   sertraline (ZOLOFT) 25 MG tablet, TAKE 1 TABLET BY MOUTH EVERY DAY, Disp: 90 tablet, Rfl: 3   silodosin (RAPAFLO) 8 MG CAPS capsule, Take 1 capsule (8 mg total) by mouth at bedtime., Disp: 90 capsule, Rfl: 3   Vitamin D, Ergocalciferol, (DRISDOL) 1.25 MG (50000 UNIT) CAPS capsule, TAKE 1 CAPSULE (50,000 UNITS TOTAL) BY MOUTH EVERY 7 (SEVEN) DAYS, Disp: 12 capsule, Rfl: 3  Clinical Interview:   The following information was obtained during a clinical interview with Christian [name] and his wife prior to cognitive testing.  Cognitive Symptoms: Decreased short-term memory: Endorsed. He reported ongoing difficulties recalling names and individuals who he has recently met. He reported that these difficulties have been longstanding in nature (i.e., when he was "knee  high to an ant") and have remained persistent throughout his life. His wife also reported some difficulties surrounding trouble recalling details of recent conversations and some repetition in day-to-day conversation. However, she noted that these observations may be related to intentional attention/focus rather than true forgetfulness. When meeting with Ms. Wertman back in April 2024, his wife noted some decline in memory following Mr. Stumph 2021 NSTEMI but that difficulties had also been present prior to this. Decreased long-term memory: Denied. Decreased attention/concentration: Denied. He described attention and focus as being highly interest dependent. He reported having the ability to attend and focus to anything if he wishes to.  Reduced processing speed: Denied. Difficulties with executive functions: Denied. Difficulties with emotion regulation: Denied. He and his wife denied any prominent personality changes.  Difficulties with receptive language: Denied. Difficulties with word finding: Endorsed. He reported some stuttering behaviors and tip-of-the-tongue phenomenons.  Decreased visuoperceptual ability: Denied.  Difficulties completing ADLs: Unclear. Mr. Edell emphasized "I hate taking pills." Based on his description, variable medication adherence seems more of a willful choice due to a distaste for medications rather than true forgetfulness. Some of this is also due to swallowing difficulties. His wife described medication adherence as a point of contention between them, highlighting that her attempts to get him to take his medications as prescribed are often challenged due to his lack of desire to take said medications. She also noted a longstanding preference where Mr. Nusz may procrastinate when paying bills. Regarding driving, he does not currently drive. Per his description, a therapist came to his home in the past, looked over his medication list, and concluded that he should not  drive due to safety reasons. He emphasized his belief that he is able to drive and should be tested in a more applied setting rather than this being revoked without an attempt to demonstrate his abilities.   Additional Medical History: History of traumatic brain injury/concussion: Unclear. He has had a few falls in the past with potential head impacts. Neuroimaging has been unremarkable and it was unclear if he was ever formally diagnosed with a concussion. He also emphasized that some of what his medical team has referred to as a fall, he would deem a result of fatigue and him lowering himself to the ground.  History of stroke: His wife theorized a potential stroke in the days following his heart attack  during rehabilitation efforts. Subsequent neuroimaging procedures have been unable to document any prior infarction per currently available records.  History of seizure activity: Denied. History of known exposure to toxins: Denied. Symptoms of chronic pain: Endorsed. He reported diffuse arthritic pain and benefits from aspirin or Tylenol as needed.  Experience of frequent headaches/migraines: Denied. He reported a remote history of migraine headaches.  Frequent instances of dizziness/vertigo: Denied.  Sensory changes: He wears glasses with benefit. Other sensory changes/difficulties (e.g., hearing, taste, smell) were not reported.  Balance/coordination difficulties: Endorsed. He reported some residual left sided weakness and balance instability stemming from his heart attack in 2021. As stated above, he has had a few falls in the past.  Other motor difficulties: Denied.  Sleep History: Estimated hours obtained each night: 8 hours.  Difficulties falling asleep: Denied. Difficulties staying asleep: Denied. Feels rested and refreshed upon awakening: Endorsed.  History of snoring: Denied. History of waking up gasping for air: Denied. Witnessed breath cessation while asleep: Denied.  History of  vivid dreaming: Denied. Excessive movement while asleep: Denied. Instances of acting out his dreams: Denied.  Psychiatric/Behavioral Health History: Depression: He described his current mood as "good" and denied to his knowledge any prior mental health concerns or formal diagnoses. Current or remote suicidal ideation, intent, or plan was denied.  Anxiety: Denied. Mania: Denied. Trauma History: Denied. Visual/auditory hallucinations: Denied. Delusional thoughts: Denied.  Tobacco: Denied. Alcohol: He reported very infrequent alcohol consumption and denied a history of problematic alcohol abuse or dependence.  Recreational drugs: Denied.  Family History: Problem Relation Age of Onset   Diabetes Mother    Hypertension Mother    Diabetes Father    Diabetes Brother    Hypertension Brother    Bone cancer Brother    Diabetes Brother    This information was confirmed by Christian [name].  Academic/Vocational History: Highest level of educational attainment: 14 years. He graduated from high school and reported completing two additional years of "school after high school, what you would call an Associate's degree." He described himself as an average student across academic subjects. Reading and reading speed was described as a potential relative weakness.  History of developmental delay: Denied. History of grade repetition: Denied. Enrollment in special education courses: Denied. History of LD/ADHD: Denied.  Employment: Retired. He previously worked as a Production designer, theatre/television/film for [employer]  Evaluation Results:   Behavioral Observations: Christian [name] was accompanied by his wife, arrived to his appointment on time, and was appropriately dressed and groomed. He appeared alert. He ambulated with the assistance of a rolling walker and maneuvered this device reasonably well. Gross motor functioning appeared intact upon informal observation and no abnormal movements (e.g., tremors) were noted during interview. His  affect was generally relaxed and positive, but did range appropriately given the subject being discussed during interview. Spontaneous speech was fluent and word finding difficulties were not observed during the clinical interview. Thought processes were coherent, organized, and normal in content. Insight into his cognitive difficulties appeared poor and I do not believe that he has a full appreciation for the extent of ongoing impairment.   During testing, a fairly prominent tremor was observed when motorized testing was first attempted. This is despite his denial of any tremulous actions during interview. Given the prominence of these symptoms, motorized tasks were substituted with non-motor analogs when possible. Sustained attention was generally adequate. Task engagement was variable (see below). During weaker periods of engagement, he did benefit from encouragement provided by the psychometrist. When filling  out a depression questionnaire, he made a comment surrounding him hearing his uncle talking to him but was unable to make out what was being said. It was unclear if this represented an attempt at humor or Christian [name] reporting a possible auditory hallucination. Overall, Christian [name] was cooperative with the clinical interview and subsequent testing procedures.   Adequacy of Effort: The validity of neuropsychological testing is limited by the extent to which the individual being tested may be assumed to have exerted adequate effort during testing. Christian [name] expressed his intention to perform to the best of his abilities. Scores across stand-alone and embedded performance validity measures were below expectation. Some of this could certainly be due to true and significant cognitive impairment. However, during the early portions of testing, he did make comments such as his brain was "starting to go out to lunch" and that his brain "had checked out." Across memory testing, he was also very quick to report  no recollection but then displayed at least some recall with encouragement provided by the psychometrist. Overall some caution is likely prudent when interpreting test performances and taking these at face value.  Test Results: Mr. Mounts was mildly disoriented at the time of the current evaluation. He incorrectly stated his age 78" followed by "61") and was unable to state the current date or day of the week. When asked why he was here, he responded "I really don't know... I guess for my memory loss."  Intellectual abilities based upon educational and vocational attainment were estimated to be in the average range. Premorbid abilities were estimated to be within the well below average range based upon a single-word reading test. This lower performance may reflect his report of a longstanding weakness surrounding reading dating back to early academic settings rather than premorbid intellectual abilities.   Processing speed was exceptionally low to below average. Basic attention was exceptionally low to well below average. More complex attention (e.g., working memory) was below average. Executive functioning was well below average to below average.  While not directly assessed, receptive language abilities were believed to be intact. Mr. Macmillan did not exhibit any difficulties comprehending task instructions and answered all questions asked of him appropriately. Assessed expressive language (e.g., verbal fluency and confrontation naming) was somewhat variable. Phonemic fluency was exceptionally low, semantic fluency was exceptionally low to below average, and confrontation naming was below average to average.     Assessed visuospatial/visuoconstructional abilities were well below average outside of his drawing of a clock (which was generally appropriate). Points were lost on his drawing of a clock due to him forgetting to include the number 7 in his drawing. Points were lost across his copy of a  complex figure due to a few mild visual distortions and one external aspect being omitted entirely.   Learning (i.e., encoding) of novel verbal information was exceptionally low. Spontaneous delayed recall (i.e., retrieval) of previously learned information was variable, ranging from the exceptionally low to average normative ranges. Retention rates were 0% across a list learning task, 75% (raw score of 3) across a story learning task, and 87% across a figure drawing task. Performance across recognition tasks was variable, ranging from the exceptionally low to average normative ranges, suggesting some evidence for information consolidation.   Results of emotional screening instruments suggested that recent symptoms of generalized anxiety were in the mild range, while symptoms of depression were also within the mild range. A screening instrument assessing recent sleep quality suggested the presence of minimal sleep  dysfunction.  Tables of Scores:   Note: This summary of test scores accompanies the interpretive report and should not be considered in isolation without reference to the appropriate sections in the text. Descriptors are based on appropriate normative data and may be adjusted based on clinical judgment. Terms such as "Within Normal Limits" and "Outside Normal Limits" are used when a more specific description of the test score cannot be determined.       Percentile - Normative Descriptor > 98 - Exceptionally High 91-97 - Well Above Average 75-90 - Above Average 25-74 - Average 9-24 - Below Average 2-8 - Well Below Average < 2 - Exceptionally Low       Validity:   DESCRIPTOR       DCT: --- --- Outside Normal Limits  RBANS EI: --- --- Outside Normal Limits       Orientation:      Raw Score Percentile   NAB Orientation, Form 1 24/29 --- ---       Cognitive Screening:      Raw Score Percentile   SLUMS: 14/30 --- ---       RBANS, Form A: Standard Score/ Scaled Score Percentile    Total Score --- --- ---  Immediate Memory 53 <1 Exceptionally Low    List Learning 2 <1 Exceptionally Low    Story Memory 3 1 Exceptionally Low  Visuospatial/Constructional 72 3 Well Below Average    Figure Copy 5 5 Well Below Average    Line Orientation 12/20 3-9 Well Below Average  Language 74 4 Well Below Average    Picture Naming 10/10 51-75 Average    Semantic Fluency 1 <1 Exceptionally Low  Attention --- --- ---    Digit Span 3 1 Exceptionally Low    Coding Discontinued (tremor) --- ---  Delayed Memory 71 3 Well Below Average    List Recall 0/10 <2 Exceptionally Low    List Recognition 16/20 <2 Exceptionally Low    Story Recall 4 2 Well Below Average    Story Recognition 8/12 8-15 Well Below Average  to Below Average    Figure Recall 10 50 Average    Figure Recognition 8/8 70+ Average        Intellectual Functioning:      Standard Score Percentile   Test of Premorbid Functioning: 72 3 Well Below Average       Attention/Executive Function:     Oral Trail Making Test (OTMT): Raw Score (Z-Score) Percentile     Part A 9 secs.,  0 errors (-0.77) 22 Below Average    Part B 58 secs.,  0 errors (-1.01) 16 Below Average       Symbol Digit Modalities Test (SDMT): Raw Score (Z-Score) Percentile     Oral 19 (-2.5) 1 Exceptionally Low        Scaled Score Percentile   WAIS-5 Naming Speed Quantity: 7 16 Below Average        Scaled Score Percentile   WAIS-5 Digits Forwards: 4 2 Well Below Average  WAIS-5 Digit Sequencing: 7 16 Below Average       D-KEFS Verbal Fluency Test: Raw Score (Scaled Score) Percentile     Letter Total Correct 10 (3) 1 Exceptionally Low    Category Total Correct 14 (2) <1 Exceptionally Low    Category Switching Total Correct 7 (4) 2 Well Below Average    Category Switching Accuracy 6 (5) 5 Well Below Average      Total Set Loss Errors 4 (8)  25 Average      Total Repetition Errors 5 (8) 25 Average       Language:     Verbal Fluency Test: Raw  Score (T Score) Percentile     Phonemic Fluency (FAS) 10 (27) 1 Exceptionally Low    Animal Fluency 9 (37) 9 Below Average        NAB Language Module, Form 1: T Score Percentile     Naming 27/31 (37) 9 Below Average       Visuospatial/Visuoconstruction:      Raw Score Percentile   Clock Drawing: 8/10 --- Within Normal Limits       Mood and Personality:      Raw Score Percentile   Geriatric Depression Scale: 14 --- Mild  Geriatric Anxiety Scale: 15 --- Mild    Somatic 6 --- Mild    Cognitive 5 --- Mild    Affective 4 --- Mild       Additional Questionnaires:      Raw Score Percentile   PROMIS Sleep Disturbance Questionnaire: 13 --- None to Slight   Informed Consent and Coding/Compliance:   The current evaluation represents a clinical evaluation for the purposes previously outlined by the referral source and is in no way reflective of a forensic evaluation.   Mr. Crocket was provided with a verbal description of the nature and purpose of the present neuropsychological evaluation. Also reviewed were the foreseeable risks and/or discomforts and benefits of the procedure, limits of confidentiality, and mandatory reporting requirements of this provider. The patient was given the opportunity to ask questions and receive answers about the evaluation. Oral consent to participate was provided by the patient.   This evaluation was conducted by Newman Nickels, Ph.D., ABPP-CN, board certified clinical neuropsychologist. Mr. Krenz completed a clinical interview with Dr. Milbert Coulter, billed as one unit [phone], and 110 minutes of cognitive testing and scoring, billed as one unit [id number] and three additional units [id number]. Psychometrist Wallace Keller, B.S. assisted Dr. Milbert Coulter with test administration and scoring procedures. As a separate and discrete service, one unit [id number] and two units [phone] were billed for Dr. Tammy Sours time spent in interpretation and report writing.

## 2023-06-10 ENCOUNTER — Encounter: Payer: Self-pay | Admitting: Psychology

## 2023-06-16 ENCOUNTER — Ambulatory Visit (INDEPENDENT_AMBULATORY_CARE_PROVIDER_SITE_OTHER): Payer: Medicare HMO | Admitting: Psychology

## 2023-06-16 DIAGNOSIS — F03A0 Unspecified dementia, mild, without behavioral disturbance, psychotic disturbance, mood disturbance, and anxiety: Secondary | ICD-10-CM | POA: Diagnosis not present

## 2023-06-16 NOTE — Progress Notes (Signed)
   Neuropsychology Feedback Session Christian [name]. [facility] Department of Neurology  Reason for Referral:   Christian [name] is a 78 y.o. 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   Feedback:   Christian [name] completed a comprehensive neuropsychological evaluation on 06/09/2023. Please refer to that encounter for the full report and recommendations. Briefly, results suggested prominent impairment surrounding phonemic fluency and all aspects of verbal learning and memory. Further weaknesses were exhibited across processing speed, attention/concentration, executive functioning, semantic fluency, and visuospatial abilities. A previous brain MRI in 2020 suggested moderate cerebral atrophy most prominent in the frontal and temporal lobes. This would represent a significant risk factor for underlying frontotemporal lobar degeneration (FTLD). Patterns across testing suggesting verbal memory impairment, phonemic fluency impairment, and further weaknesses surrounding executive functioning would certainly align with this. Behaviorally, Christian [name] wife stopped short of endorsing significant personality changes, suggesting that behavioral/personality concerns have been somewhat longstanding in nature. However, concerns surrounding a FTLD presentation remain. Christian [name] wife theorized that he sustained a stroke in the days following his 2021 NSTEMI. However, no prior infarct has ever been demonstrated on follow-up neuroimaging to my knowledge. As such, I cannot point to any objective information to support this and link cognitive dysfunction to a direct cerebrovascular cause. His most recent brain MRI in in June 2024 did suggest underlying microvascular  disease. However, the reading neuroradiologist did not specify the underlying severity, making further interpretation limited. Given his medical history (e.g., NSTEMI, hypertension, cardiomyopathy, coronary artery disease, congestive heart failure, hyperlipidemia, type II diabetes), a vascular contribution of some significance certainly seems plausible.   Christian [name] was accompanied by his wife during the current telephone call. He was within his residence while I was within my office. I discussed the limitations of evaluation and management by telemedicine and the availability of in person appointments. Christian [name] expressed his understanding and agreed to proceed. Content of the current session focused on the results of his neuropsychological evaluation. Christian [name] was given the opportunity to ask questions and his questions were answered. He was encouraged to reach out should additional questions arise. A copy of his report was mailed at the conclusion of the visit.      One unit [phone] was billed for Dr. Tammy Sours time spent preparing for, conducting, and documenting the current feedback session with Christian [name].

## 2023-06-22 ENCOUNTER — Ambulatory Visit (INDEPENDENT_AMBULATORY_CARE_PROVIDER_SITE_OTHER): Payer: Medicare HMO | Admitting: Physician Assistant

## 2023-06-22 ENCOUNTER — Encounter: Payer: Self-pay | Admitting: Physician Assistant

## 2023-06-22 ENCOUNTER — Other Ambulatory Visit: Payer: Medicare HMO

## 2023-06-22 VITALS — Ht 69.0 in

## 2023-06-22 DIAGNOSIS — R413 Other amnesia: Secondary | ICD-10-CM

## 2023-06-22 DIAGNOSIS — F03A0 Unspecified dementia, mild, without behavioral disturbance, psychotic disturbance, mood disturbance, and anxiety: Secondary | ICD-10-CM

## 2023-06-22 NOTE — Patient Instructions (Addendum)
 It was a pleasure to see you today at our office.   Recommendations:   PET scan of the brain at [facility] Blood tests today [address] today Repeat neuropsychological sting in 1 year from now Follow up aug 25 at 11:30   For assessment of decision of mental capacity and competency:  Call Dr. Erick Blinks, geriatric psychiatrist at [phone] Counseling regarding caregiver distress, including caregiver depression, anxiety and issues regarding community resources, adult day care programs, adult living facilities, or memory care questions:  please contact your  Primary Doctor's Social Worker  Whom to call: Memory  decline, memory medications: Call our office [phone]  For psychiatric meds, mood meds: Please have your primary care physician manage these medications.  If you have any severe symptoms of a stroke, or other severe issues such as confusion,severe chills or fever, etc call 911 or go to the ER as you may need to be evaluated further    RECOMMENDATIONS FOR ALL PATIENTS WITH MEMORY PROBLEMS: 1. Continue to exercise (Recommend 30 minutes of walking everyday, or 3 hours every week) 2. Increase social interactions - continue going to [place] and enjoy social gatherings with friends and family 3. Eat healthy, avoid fried foods and eat more fruits and vegetables 4. Maintain adequate blood pressure, blood sugar, and blood cholesterol level. Reducing the risk of stroke and cardiovascular disease also helps promoting better memory. 5. Avoid stressful situations. Live a simple life and avoid aggravations. Organize your time and prepare for the next day in anticipation. 6. Sleep well, avoid any interruptions of sleep and avoid any distractions in the bedroom that may interfere with adequate sleep quality 7. Avoid sugar, avoid sweets as there is a strong link between excessive sugar intake, diabetes, and cognitive impairment We discussed the Mediterranean diet, which has been shown to help  patients reduce the risk of progressive memory disorders and reduces cardiovascular risk. This includes eating fish, eat fruits and green leafy vegetables, nuts like almonds and hazelnuts, walnuts, and also use olive oil. Avoid fast foods and fried foods as much as possible. Avoid sweets and sugar as sugar use has been linked to worsening of memory function.  There is always a concern of gradual progression of memory problems. If this is the case, then we may need to adjust level of care according to patient needs. Support, both to the patient and caregiver, should then be put into place.      You have been referred for a neuropsychological evaluation (i.e., evaluation of memory and thinking abilities). Please bring someone with you to this appointment if possible, as it is helpful for the doctor to hear from both you and another adult who knows you well. Please bring eyeglasses and hearing aids if you wear them.    The evaluation will take approximately 3 hours and has two parts:   The first part is a clinical interview with the neuropsychologist (Dr. Milbert Coulter or Dr. Roseanne Reno). During the interview, the neuropsychologist will speak with you and the individual you brought to the appointment.    The second part of the evaluation is testing with the doctor's technician Annabelle Harman or Selena Batten). During the testing, the technician will ask you to remember different types of material, solve problems, and answer some questionnaires. Your family member will not be present for this portion of the evaluation.   Please note: We must reserve several hours of the neuropsychologist's time and the psychometrician's time for your evaluation appointment. As such, there is a  No-Show fee of $100. If you are unable to attend any of your appointments, please contact our office as soon as possible to reschedule.    FALL PRECAUTIONS: Be cautious when walking. Scan the area for obstacles that may increase the risk of trips and falls.  When getting up in the mornings, sit up at the edge of the bed for a few minutes before getting out of bed. Consider elevating the bed at the head end to avoid drop of blood pressure when getting up. Walk always in a well-lit room (use night lights in the walls). Avoid area rugs or power cords from appliances in the middle of the walkways. Use a walker or a cane if necessary and consider physical therapy for balance exercise. Get your eyesight checked regularly.  FINANCIAL OVERSIGHT: Supervision, especially oversight when making financial decisions or transactions is also recommended.  HOME SAFETY: Consider the safety of the kitchen when operating appliances like stoves, microwave oven, and blender. Consider having supervision and share cooking responsibilities until no longer able to participate in those. Accidents with firearms and other hazards in the house should be identified and addressed as well.   ABILITY TO BE LEFT ALONE: If patient is unable to contact 911 operator, consider using LifeLine, or when the need is there, arrange for someone to stay with patients. Smoking is a fire hazard, consider supervision or cessation. Risk of wandering should be assessed by caregiver and if detected at any point, supervision and safe proof recommendations should be instituted.  MEDICATION SUPERVISION: Inability to self-administer medication needs to be constantly addressed. Implement a mechanism to ensure safe administration of the medications.   DRIVING: Regarding driving, in patients with progressive memory problems, driving will be impaired. We advise to have someone else do the driving if trouble finding directions or if minor accidents are reported. Independent driving assessment is available to determine safety of driving.   If you are interested in the driving assessment, you can contact the following:  [facility] in [place] [phone]  [facility]  [phone]  [facility] [phone] or [phone]    Mediterranean Diet A Mediterranean diet refers to food and lifestyle choices that are based on the traditions of countries located on the [employer]. This way of eating has been shown to help prevent certain conditions and improve outcomes for people who have chronic diseases, like kidney disease and heart disease. What are tips for following this plan? Lifestyle  Cook and eat meals together with your family, when possible. Drink enough fluid to keep your urine clear or pale yellow. Be physically active every day. This includes: Aerobic exercise like running or swimming. Leisure activities like gardening, walking, or housework. Get 7-8 hours of sleep each night. If recommended by your health care provider, drink red wine in moderation. This means 1 glass a day for nonpregnant women and 2 glasses a day for men. A glass of wine equals 5 oz (150 mL). Reading food labels  Check the serving size of packaged foods. For foods such as rice and pasta, the serving size refers to the amount of cooked product, not dry. Check the total fat in packaged foods. Avoid foods that have saturated fat or trans fats. Check the ingredients list for added sugars, such as corn syrup. Shopping  At the grocery store, buy most of your food from the areas near the walls of the store. This includes: Fresh fruits and vegetables (produce). Grains, beans,  nuts, and seeds. Some of these may be available in unpackaged forms or large amounts (in bulk). Fresh seafood. Poultry and eggs. Low-fat dairy products. Buy whole ingredients instead of prepackaged foods. Buy fresh fruits and vegetables in-season from local farmers markets. Buy frozen fruits and vegetables in resealable bags. If you do not have access to quality fresh seafood, buy precooked frozen shrimp or canned fish, such as tuna, salmon, or sardines. Buy  small amounts of raw or cooked vegetables, salads, or olives from the deli or salad bar at your store. Stock your pantry so you always have certain foods on hand, such as olive oil, canned tuna, canned tomatoes, rice, pasta, and beans. Cooking  Cook foods with extra-virgin olive oil instead of using butter or other vegetable oils. Have meat as a side dish, and have vegetables or grains as your main dish. This means having meat in small portions or adding small amounts of meat to foods like pasta or stew. Use beans or vegetables instead of meat in common dishes like chili or lasagna. Experiment with different cooking methods. Try roasting or broiling vegetables instead of steaming or sauteing them. Add frozen vegetables to soups, stews, pasta, or rice. Add nuts or seeds for added healthy fat at each meal. You can add these to yogurt, salads, or vegetable dishes. Marinate fish or vegetables using olive oil, lemon juice, garlic, and fresh herbs. Meal planning  Plan to eat 1 vegetarian meal one day each week. Try to work up to 2 vegetarian meals, if possible. Eat seafood 2 or more times a week. Have healthy snacks readily available, such as: Vegetable sticks with hummus. Greek yogurt. Fruit and nut trail mix. Eat balanced meals throughout the week. This includes: Fruit: 2-3 servings a day Vegetables: 4-5 servings a day Low-fat dairy: 2 servings a day Fish, poultry, or lean meat: 1 serving a day Beans and legumes: 2 or more servings a week Nuts and seeds: 1-2 servings a day Whole grains: 6-8 servings a day Extra-virgin olive oil: 3-4 servings a day Limit red meat and sweets to only a few servings a month What are my food choices? Mediterranean diet Recommended Grains: Whole-grain pasta. Brown rice. Bulgar wheat. Polenta. Couscous. Whole-wheat bread. Orpah Cobb. Vegetables: Artichokes. Beets. Broccoli. Cabbage. Carrots. Eggplant. Green beans. Chard. Kale. Spinach. Onions. Leeks. Peas.  Squash. Tomatoes. Peppers. Radishes. Fruits: Apples. Apricots. Avocado. Berries. Bananas. Cherries. Dates. Figs. Grapes. Lemons. Melon. Oranges. Peaches. Plums. Pomegranate. Meats and other protein foods: Beans. Almonds. Sunflower seeds. Pine nuts. Peanuts. Cod. Salmon. Scallops. Shrimp. Tuna. Tilapia. Clams. Oysters. Eggs. Dairy: Low-fat milk. Cheese. Greek yogurt. Beverages: Water. Red wine. Herbal tea. Fats and oils: Extra virgin olive oil. Avocado oil. Grape seed oil. Sweets and desserts: [place] yogurt with honey. Baked apples. Poached pears. Trail mix. Seasoning and other foods: Basil. Cilantro. Coriander. Cumin. Mint. Parsley. Sage. Rosemary. Tarragon. Garlic. Oregano. Thyme. Pepper. Balsalmic vinegar. Tahini. Hummus. Tomato sauce. Olives. Mushrooms. Limit these Grains: Prepackaged pasta or rice dishes. Prepackaged cereal with added sugar. Vegetables: Deep fried potatoes (french fries). Fruits: Fruit canned in syrup. Meats and other protein foods: Beef. Pork. Lamb. Poultry with skin. Hot dogs. Tomasa Blase. Dairy: Ice cream. Sour cream. Whole milk. Beverages: Juice. Sugar-sweetened soft drinks. Beer. Liquor and spirits. Fats and oils: Butter. Canola oil. Vegetable oil. Beef fat (tallow). Lard. Sweets and desserts: Cookies. Cakes. Pies. Candy. Seasoning and other foods: Mayonnaise. Premade sauces and marinades. The items listed may not be a complete list. Talk with your dietitian about what  dietary choices are right for you. Summary The Mediterranean diet includes both food and lifestyle choices. Eat a variety of fresh fruits and vegetables, beans, nuts, seeds, and whole grains. Limit the amount of red meat and sweets that you eat. Talk with your health care provider about whether it is safe for you to drink red wine in moderation. This means 1 glass a day for nonpregnant women and 2 glasses a day for men. A glass of wine equals 5 oz (150 mL). This information is not intended to replace advice  given to you by your health care provider. Make sure you discuss any questions you have with your health care provider. Document Released: 12/05/2015 Document Revised: 01/07/2016 Document Reviewed: 12/05/2015 Elsevier Interactive Patient Education  2017 [employer].

## 2023-06-22 NOTE — Progress Notes (Signed)
 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  Patient is not on antidementia medication, continues to decline therapy.  .  Follow up in 6 months. Continue B12 replenishment Recommend FDG-PET scan to better understand frontotemporal dementia concerns Repeat neuropsych evaluation in 12 to 18 months for diagnostic clarity and disease trajectory Recommend good control of her cardiovascular risk factors, continue Plavix Continue to control mood as per PCP     Subjective:    This patient is accompanied in the office by  who supplements the history.  Previous records as well as any outside records available were reviewed prior to todays visit. Patient was last seen on 08/13/2022 with [facility] 15/30   Any changes in memory since last visit? " Worse".  He reports more difficulty remembering recent conversations and names.  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  Endorsed by his wife  Disoriented when walking into a room?  Patient denies    Leaving objects?  May misplace things but not in unusual places   Wandering behavior?  denies   Any personality changes since last visit?  denies   Any worsening depression?:  Denies.   Hallucinations or paranoia?  "I hear a voice every now and then but I cannot tell what they are trying to tell me". No  visual hallucinations.  No paranoia Seizures? denies    Any sleep changes? "Yes and no".   Denies vivid dreams, +REM behavior, no sleepwalking  "one morning I found myself on the floor".  Sleep apnea?   Denies.   Any hygiene concerns? Denies.  Independent of bathing and dressing?  Endorsed  Does the patient needs help with medications?  Wife is in charge  Who is in charge of the finances?  Wife is in charge    Any changes in appetite?  Eats well, "too good " Patient have trouble swallowing?  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  Had ST in the past.   Does the patient cook? No Any headaches?   denies   Chronic back pain endorsed, chronic Ambulates with difficulty?  Endorsed, uses a cane and the Rolator  for stability Recent falls or head injuries?  Last fall was on December 2024, no LOC, but sustaining a small laceration on the right upper lip and hematoma on the right forehead, with negative CT of the head and neck.   Unilateral weakness, numbness or tingling?  He has chronic, very mild left-sided weakness.  He also has diabetic polyneuropathy. Any tremors?  "Always had shakes and was born nervous " any anosmia?  Denies   Any incontinence of urine?  Endorsed, he has incomplete bladder emptying, he is on Rapaflo. Any bowel dysfunction?  He may have some stool incontinence as before. Patient lives with his wife  Does the patient drive? No longer drives     Neuropsych evaluation 06/09/23. Briefly, results suggested prominent impairment surrounding phonemic fluency  and all aspects of verbal learning and memory. Further weaknesses were exhibited across processing speed, attention/concentration, executive functioning, semantic fluency, and visuospatial abilities. A previous brain MRI in 2020 suggested moderate cerebral atrophy most prominent in the frontal and temporal lobes. This would represent a significant risk factor for underlying frontotemporal lobar degeneration (FTLD). Patterns across testing suggesting verbal memory impairment, phonemic fluency  impairment, and further weaknesses surrounding executive functioning would certainly align with this. Behaviorally, Christian [name] wife stopped short of endorsing significant personality changes, suggesting that behavioral/personality concerns have been somewhat longstanding in nature. However, concerns surrounding a FTLD presentation remain. Christian [name] wife theorized that he sustained a stroke in the days following his 2021 NSTEMI. However, no prior infarct has ever been demonstrated on follow-up neuroimaging to my knowledge. As such, I cannot point to any objective information to support this and link cognitive dysfunction to a direct cerebrovascular cause. His most recent brain MRI in June 2024 did suggest underlying microvascular disease. However, the reading neuroradiologist did not specify the underlying severity, making further interpretation limited. Given his medical history (e.g., NSTEMI, hypertension, cardiomyopathy, coronary artery disease, congestive heart failure, hyperlipidemia, type II diabetes), a vascular contribution of some significance certainly seems plausible.    Initial visit 08/13/2022 How long did patient have memory difficulties?"  I don't know why I am here, except I cannot remember my chess movements since I got of the hospital in 2021". Patient denies any worsening difficulty remembering recent conversations and people names". 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  repeats oneself?  Endorsed by his wife "all the time, even before 2021" Disoriented when walking into a room?  Patient denies  Leaving objects in unusual places? Denies any changes, "I remember where my stuff is"  Wandering behavior?  Denies. I don't walk outside to prevent falls"    Any personality changes since last visit?  Patient denies   Any history of depression?:  My personality has changed, I am nicer than before" Hallucinations or paranoia?  Patient denies   Seizures?    Patient denies    Any sleep changes? Sleeps well, denies vivid dreams, REM behavior or sleepwalking.   Sleep apnea?  Patient denies   Any hygiene concerns?  Patient denies   Independent of bathing and dressing?  Endorsed  Does the patient needs help with medications? Wife is in charge   Who is in charge of the finances? Wife  is in charge     Any changes in appetite? "I want to lose weight so I watch diet" Patient have trouble swallowing? Chronic, since 2021. Had a swallow study at [facility] back then for dysfunctional valve. Had ST in the past  Does the patient cook?   Any kitchen accidents such as leaving the stove on? denies   Any headaches?   Not recently  Chronic back pain ? Endorsed, trying to lose weight "I feel the weight hast to do with it".  Ambulates with difficulty?  Uses with a cane for stability  Recent falls or head injuries?" I fell after a doctor's visit", mechanical. No LOC, mild bruise on the L eyelid Vision changes? Endorsed, has an appt with her eye doctor this week, may need new glasses Unilateral weakness, numbness or tingling? Residual very mild left-sided weakness  Any tremors? "  Always had shakes, I was born nervous and evil" Any anosmia?  denies   Any incontinence of urine?  He has  a history of incomplete bladder emptying on Rapaflo Any bowel dysfunction? "Sometimes when I make urine it some lose stool may come" Patient lives with wife     History of heavy alcohol intake? denies   History of heavy tobacco use?  denies   Family history of dementia? denies  Does patient drive?  No longer drives   Retired Production designer, theatre/television/film at [employer]     CT head 07/2022 No evidence of acute intracranial abnormality.2. No evidence of acute fracture or traumatic malalignment.3. 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 MRI could further characterize if clinically warranted.  PREVIOUS MEDICATIONS:   CURRENT MEDICATIONS:  Outpatient Encounter Medications as of  06/22/2023  Medication Sig   albuterol (VENTOLIN HFA) 108 (90 Base) MCG/ACT inhaler TAKE 2 PUFFS BY MOUTH EVERY 6 HOURS AS NEEDED FOR WHEEZE OR SHORTNESS OF BREATH   atorvastatin (LIPITOR) 20 MG tablet TAKE 1 TABLET BY MOUTH EVERY DAY IN THE EVENING   budesonide-formoterol (SYMBICORT) 160-4.5 MCG/ACT inhaler INHALE 2 PUFFS INTO THE LUNGS TWICE A DAY   clonazePAM (KLONOPIN) 0.5 MG disintegrating tablet TAKE 1 TABLET BY MOUTH TWICE A DAY   clopidogrel (PLAVIX) 75 MG tablet TAKE 1 TABLET BY MOUTH EVERY DAY   cromolyn (OPTICROM) 4 % ophthalmic solution Place 1 drop into both eyes every Wednesday. At bedtime   [employer] ACETAMINOPHEN 325 MG tablet TAKE 2 CAPS BY MOUTH EVERY 6 HOURS AS NEEDED FOR PAIN FOR TEMP 100F OR ABOVE (Patient taking differently: Take 325 mg by mouth every 6 (six) hours as needed for mild pain (pain score 1-3) or headache (temp over 100).)   [employer] SENNA PLUS 8.6-50 MG tablet TAKE 2 TABLETS BY MOUTH AT BEDTIME FOR CONSTIPATION   ferrous sulfate 325 (65 FE) MG tablet Take 325 mg by mouth daily with breakfast.   fluticasone (FLONASE) 50 MCG/ACT nasal spray SPRAY 1 SPRAY INTO EACH NOSTRIL EVERY DAY   furosemide (LASIX) 80 MG tablet Take 120 mg by mouth daily.   glimepiride (AMARYL) 2 MG tablet TAKE 1 TABLET BY MOUTH DAILY BEFORE BREAKFAST.   hydrALAZINE (APRESOLINE) 100 MG tablet TAKE 1 TABLET BY MOUTH 3 TIMES DAILY.   isosorbide dinitrate (ISORDIL) 30 MG tablet TAKE 1 TABLET BY MOUTH 3 TIMES DAILY.   lidocaine (LIDODERM) 5 % Place 1 patch onto the skin daily. Remove & Discard patch within 12 hours or as directed by MD   methocarbamol (ROBAXIN) 500 MG tablet Take 1 tablet (500 mg total) by mouth 2 (two) times daily.   metolazone (ZAROXOLYN) 5 MG tablet Take 5 mg by mouth 2 (two) times a week.   metoprolol succinate (TOPROL-XL) 50 MG 24 hr tablet Take 3 tablets (150 mg total) by mouth daily. Take with or immediately following a meal.   Multiple Vitamin (MULTIVITAMIN) tablet Take 1 tablet by  mouth daily.   nitroGLYCERIN (NITROSTAT) 0.4 MG SL tablet Place 0.4 mg under the tongue every 5 (five) minutes as needed for chest pain.   potassium chloride (KLOR-CON) 10 MEQ tablet Take 30 mEq by mouth daily.   PREVIDENT 5000 BOOSTER PLUS 1.1 % PSTE TAKE 1 APPLICATION BY MOUTH EVERY EVENING.   sertraline (ZOLOFT) 25 MG tablet TAKE 1 TABLET BY MOUTH EVERY DAY   silodosin (RAPAFLO) 8 MG CAPS capsule Take 1 capsule (8 mg total) by mouth at bedtime.   Vitamin D, Ergocalciferol, (DRISDOL) 1.25 MG (50000 UNIT) CAPS capsule TAKE 1 CAPSULE (50,000 UNITS TOTAL) BY MOUTH EVERY 7 (SEVEN) DAYS  No facility-administered encounter medications on file as of 06/22/2023.       10/18/2017    2:54 PM  MMSE - Mini Mental State Exam  Orientation to time 5  Orientation to Place 5  Registration 3  Attention/ Calculation 3  Recall 2  Language- name 2 objects 2  Language- repeat 1  Language- follow 3 step command 3  Language- read & follow direction 1  Write a sentence 1  Copy design 1  Total score 27      08/13/2022   10:00 AM  Montreal Cognitive Assessment   Visuospatial/ Executive (0/5) 3  Naming (0/3) 3  Attention: Read list of digits (0/2) 0  Attention: Read list of letters (0/1) 1  Attention: Serial 7 subtraction starting at 100 (0/3) 0  Language: Repeat phrase (0/2) 2  Language : Fluency (0/1) 0  Abstraction (0/2) 0  Delayed Recall (0/5) 0  Orientation (0/6) 6  Total 15  Adjusted Score (based on education) 15    Objective:     PHYSICAL EXAMINATION:    VITALS:   Vitals:   06/22/23 1130  Height: 5\' 9"  (1.753 m)    GEN:  The patient appears stated age and is in NAD. HEENT:  Normocephalic, atraumatic.   Neurological examination:  General: NAD, well-groomed, appears stated age. Orientation: The patient is distracted during the visit Oriented to person, place and date  Cranial nerves: There is good facial symmetry.The speech is fluent and clear. No aphasia or dysarthria. Fund  of knowledge is appropriate. Recent and remote memory are impaired. Attention and concentration are reduced.  Able to name objects and repeat phrases.  Hearing is intact to conversational tone.   Sensation: Sensation is intact to light touch throughout Motor: Strength is at least antigravity x3, LUE 4/5 DTR's 2/4 in UE/LE     Movement examination: Tone: There is normal tone in the UE/LE Abnormal movements: Very mild right greater than left intention tremor on hands.  No myoclonus.  No asterixis.   Coordination:  There is no decremation with RAM's. Normal finger to nose  Gait and Station: The patient has no  difficulty arising out of a deep-seated chair without the use of the hands.  Uses rolator today.  the patient's stride length is good.  Gait is cautious and narrow.    Thank you for allowing [place] the opportunity to participate in the care of this nice patient. Please do not hesitate to contact us for any questions or concerns.   Total time spent on today's visit was 30 minutes dedicated to this patient today, preparing to see patient, examining the patient, ordering tests and/or medications and counseling the patient, documenting clinical information in the EHR or other health record, independently interpreting results and communicating results to the patient/family, discussing treatment and goals, answering patient's questions and coordinating care.  Cc:  Myrlene Broker, MD  Marlowe Kays 06/22/2023 1:50 PM

## 2023-06-24 ENCOUNTER — Other Ambulatory Visit: Payer: Self-pay

## 2023-06-24 DIAGNOSIS — I25118 Atherosclerotic heart disease of native coronary artery with other forms of angina pectoris: Secondary | ICD-10-CM

## 2023-06-24 MED ORDER — CLOPIDOGREL BISULFATE 75 MG PO TABS: 75.0000 mg | ORAL_TABLET | Freq: Every day | ORAL | 2 refills | Status: DC

## 2023-06-29 LAB — QUEST AD-DETECT™, BETA-AMYLOID 42/40 RATIO, PLASMA
ABETA 40: 563 pg/mL
ABETA 42/40 RATIO: 0.185 (ref >=0.170)
ABETA 42: 104 pg/mL

## 2023-06-29 LAB — QUEST AD-DETECT® PHOSPHORYLATED TAU181(P-TAU181), PLASMA: QUEST AD DETECT PTAU181, PLASMA: 4.58 pg/mL — ABNORMAL HIGH (ref <=1.07)

## 2023-06-29 LAB — QUEST AD-DETECT PHOSPHORYLATED TAU217(P-TAU217), PLASMA: Quest Detect PTAU217, Plasma: 0.59 pg/mL — ABNORMAL HIGH (ref <=0.15)

## 2023-06-29 LAB — QUEST AD-DETECT APOLIPOPROTEIN E (APOE) ISOFORM, PLASMA

## 2023-07-07 ENCOUNTER — Telehealth: Payer: Self-pay

## 2023-07-07 NOTE — Telephone Encounter (Signed)
 Pet scan denied with patients insurance case #[id number] and case [id number] dob 11-16-1945.I faxed clinicals and was denied. FYI. Please advise.

## 2023-07-08 NOTE — Telephone Encounter (Signed)
 I called patient and he had received letter to of the denial request. He will follow up as scheduled.

## 2023-07-13 DIAGNOSIS — N184 Chronic kidney disease, stage 4 (severe): Secondary | ICD-10-CM | POA: Diagnosis not present

## 2023-07-13 DIAGNOSIS — N2581 Secondary hyperparathyroidism of renal origin: Secondary | ICD-10-CM | POA: Diagnosis not present

## 2023-07-13 DIAGNOSIS — D631 Anemia in chronic kidney disease: Secondary | ICD-10-CM | POA: Diagnosis not present

## 2023-07-13 DIAGNOSIS — I5032 Chronic diastolic (congestive) heart failure: Secondary | ICD-10-CM | POA: Diagnosis not present

## 2023-07-13 DIAGNOSIS — I2510 Atherosclerotic heart disease of native coronary artery without angina pectoris: Secondary | ICD-10-CM | POA: Diagnosis not present

## 2023-07-13 DIAGNOSIS — E1122 Type 2 diabetes mellitus with diabetic chronic kidney disease: Secondary | ICD-10-CM | POA: Diagnosis not present

## 2023-07-13 DIAGNOSIS — I129 Hypertensive chronic kidney disease with stage 1 through stage 4 chronic kidney disease, or unspecified chronic kidney disease: Secondary | ICD-10-CM | POA: Diagnosis not present

## 2023-07-13 DIAGNOSIS — E876 Hypokalemia: Secondary | ICD-10-CM | POA: Diagnosis not present

## 2023-07-13 DIAGNOSIS — N189 Chronic kidney disease, unspecified: Secondary | ICD-10-CM | POA: Diagnosis not present

## 2023-07-14 LAB — LAB REPORT - SCANNED: EGFR: 21

## 2023-08-04 ENCOUNTER — Ambulatory Visit (INDEPENDENT_AMBULATORY_CARE_PROVIDER_SITE_OTHER): Payer: Federal, State, Local not specified - PPO | Admitting: Podiatry

## 2023-08-04 ENCOUNTER — Encounter: Payer: Self-pay | Admitting: Podiatry

## 2023-08-04 VITALS — Ht 69.0 in | Wt 206.0 lb

## 2023-08-04 DIAGNOSIS — M79674 Pain in right toe(s): Secondary | ICD-10-CM | POA: Diagnosis not present

## 2023-08-04 DIAGNOSIS — B353 Tinea pedis: Secondary | ICD-10-CM | POA: Diagnosis not present

## 2023-08-04 DIAGNOSIS — E1142 Type 2 diabetes mellitus with diabetic polyneuropathy: Secondary | ICD-10-CM

## 2023-08-04 DIAGNOSIS — M2141 Flat foot [pes planus] (acquired), right foot: Secondary | ICD-10-CM

## 2023-08-04 DIAGNOSIS — M2142 Flat foot [pes planus] (acquired), left foot: Secondary | ICD-10-CM

## 2023-08-04 DIAGNOSIS — B351 Tinea unguium: Secondary | ICD-10-CM | POA: Diagnosis not present

## 2023-08-04 DIAGNOSIS — M79675 Pain in left toe(s): Secondary | ICD-10-CM

## 2023-08-04 MED ORDER — ATHLETES FOOT POWDER SPRAY 2 % EX AERP: INHALATION_SPRAY | CUTANEOUS | 3 refills | Status: DC

## 2023-08-08 ENCOUNTER — Other Ambulatory Visit: Payer: Self-pay | Admitting: Internal Medicine

## 2023-08-08 ENCOUNTER — Encounter: Payer: Self-pay | Admitting: Podiatry

## 2023-08-08 NOTE — Progress Notes (Signed)
 ANNUAL DIABETIC FOOT EXAM  Subjective: Christian [name] presents today for annual diabetic foot exam. Chief Complaint  Patient presents with   Nail Problem    Pt is here for [facility] last A1C was 7.2 PCP is Dr Carlo Chessman and LOV was in December.   Patient confirms h/o diabetes.  Patient denies any h/o foot wounds.  Patient has been diagnosed with neuropathy.  [name], MD is patient's PCP.  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   Diabetic polyneuropathy    Dry eyes left   Elevated liver enzymes 08/09/2019   Hiatal hernia    History of coronary artery stent placement    History of pulmonary embolism 08/09/2019   HTN (hypertension) 11/12/2009   Hyperlipidemia associated with type 2 diabetes mellitus 11/12/2009   Incomplete bladder emptying    Ischemic cardiomyopathy    Left leg weakness 11/23/2018   Mild dementia, 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 LIMA TO LAD, RIMA TO PDA, RADIAL ARTERY TO CIRC AND SEQUENTIALLY TO OM1.   Shortness of breath 11/12/2009   Sleep difficulties 05/30/2020   Swelling of lower extremity    Type II diabetes mellitus 10/10/2014   Patient Active Problem List   Diagnosis Date Noted   Mild dementia, unclear and  possibly mixed etiology 06/09/2023   Diabetic polyneuropathy    CHF (congestive heart failure)    Neck swelling 05/16/2021   Atypical pneumonia 12/12/2020   Pulmonary edema 10/03/2020   Sleep difficulties 05/30/2020   CKD (chronic kidney disease) stage 4, GFR 15-29 ml/min 03/27/2020   Aortic atherosclerosis 03/27/2020   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   Pruritus 11/28/2019   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   Ischemic cardiomyopathy    History of coronary artery stent placement    History of pulmonary embolism 08/09/2019   Elevated liver enzymes 08/09/2019   NSTEMI (non-ST elevation myocardial infarction) 08/09/2019   Left leg weakness 11/23/2018   CAD (coronary artery disease) 07/30/2018   Type II diabetes mellitus 10/10/2014   Benign prostatic hyperplasia 09/27/2014   Hyperlipidemia associated with type 2 diabetes mellitus 11/12/2009   Multiple sclerosis 11/12/2009   HTN (hypertension) 11/12/2009   Shortness of breath 11/12/2009   Past Surgical History:  Procedure Laterality Date   A/V FISTULAGRAM Left 08/12/2022   Procedure: A/V Fistulagram;  Surgeon: Kayla Part, MD;  Location: [facility] INVASIVE CV LAB;  Service: Cardiovascular;  Laterality: Left;   APPLICATION OF WOUND VAC N/A 08/24/2019   Procedure: APPLICATION OF WOUND VAC;  Surgeon: Rudine Cos, MD;  Location: [facility] OR;  Service: Thoracic;  Laterality: N/A;   APPLICATION OF WOUND VAC  08/29/2019   Procedure: Wound Vac change;  Surgeon: Neila Bally,  Vernette Goo, MD;  Location: [facility] OR;  Service: Open Heart Surgery;;   APPLICATION OF WOUND VAC N/A 09/04/2019   Procedure: Application Of Wound Vac;  Surgeon: Norita Beauvais, MD;  Location: [facility] OR;  Service: Open Heart Surgery;  Laterality: N/A;   APPLICATION OF WOUND VAC N/A 09/06/2019   Procedure: APPLICATION OF ACELL, APPLICATION OF WOUND VAC USING PREVENA INCISIONAL  DRESSING;  Surgeon: Thornell Flirt, DO;  Location: [facility] OR;  Service: Plastics;  Laterality: N/A;   AV FISTULA  PLACEMENT Left 09/18/2020   Procedure: ARTERIOVENOUS (AV) FISTULA CREATION LEFT VERSUS GRAFT;  Surgeon: Margherita Shell, MD;  Location: [facility] OR;  Service: Vascular;  Laterality: Left;   BASCILIC VEIN TRANSPOSITION Left 10/31/2020   Procedure: LEFT SECOND STAGE BASCILIC VEIN TRANSPOSITION;  Surgeon: Margherita Shell, MD;  [facility];  Service: Vascular;  Laterality: Left;   CARDIAC CATHETERIZATION     CORONARY ARTERY BYPASS GRAFT N/A 08/15/2019   Procedure: CORONARY ARTERY BYPASS GRAFTING (CABG), x 4 using bilateral IMAs and left radial artery .  LIMA TO LAD, RIMA TO PDA, RADIAL ARTERY TO CIRC AND SEQUENTIALLY TO OM1.;  Surgeon: Rudine Cos, MD;  [facility]: [facility] OR;  Service: Open Heart Surgery;  Laterality: N/A;   CORONARY STENT PLACEMENT  02/27/2014   distal rt/pd coronary       dr Berry Bristol   CYSTO/ BLADDER BIOPSY'S/ CAUTHERIZATION  01-14-2004  DR Isla Mari   EXPLORATION POST OPERATIVE OPEN HEART N/A 08/16/2019   Procedure: Chest Closure S?P CABG WITH APPLICATION OF PREVENA  INCISIONAL WOUND VAC;  Surgeon: Rudine Cos, MD;  [facility];  Service: Open Heart Surgery;  Laterality: N/A;   EXPLORATION POST OPERATIVE OPEN HEART N/A 08/21/2019   Procedure: CHEST WASHOUT S/P OPEN CHEST;  Surgeon: Rudine Cos, MD;  [facility];  Service: Open Heart Surgery;  Laterality: N/A;  Open chest with Esmark dressing with Ioban sealant coverage.   EXPLORATION POST OPERATIVE OPEN HEART N/A 08/18/2019   Procedure: EXPLORATION POST OPERATIVE OPEN HEART (performed 04/23 on unit);  Surgeon: Rudine Cos, MD;  Location: [facility] OR;  Service: Open Heart Surgery;  Laterality: N/A;   EXPLORATION POST OPERATIVE OPEN HEART N/A 08/24/2019   Procedure: CHEST WASHOUT POST OPERATIVE OPEN HEART;  Surgeon: Rudine Cos, MD;  [facility];  Service: Open Heart Surgery;  Laterality: N/A;   EXPLORATION POST OPERATIVE OPEN HEART N/A 08/29/2019   Procedure: CHEST WOUND WASHOUT POST OPERATIVE OPEN HEART;  Surgeon:  Rudine Cos, MD;  [facility];  Service: Open Heart Surgery;  Laterality: N/A;   EXPLORATION POST OPERATIVE OPEN HEART N/A 09/04/2019   Procedure: MEDIASTINAL EXPLORATION WITH STERNAL WOUND IRRIGATION;  Surgeon: Norita Beauvais, MD;  Location: [facility] OR;  Service: Open Heart Surgery;  Laterality: N/A;   EXPLORATION POST OPERATIVE OPEN HEART N/A 09/14/2019   Procedure: EVACUATION OF HEMATOMA;  Surgeon: Norita Beauvais, MD;  Location: [facility] OR;  Service: Open Heart Surgery;  Laterality: N/A;   IR FLUORO GUIDE CV LINE LEFT  09/19/2019   IR GASTROSTOMY TUBE MOD SED  10/04/2019   IR GASTROSTOMY TUBE REMOVAL  11/29/2019   IR PATIENT EVAL TECH 0-60 MINS  09/29/2019   IR US  GUIDE VASC ACCESS LEFT  09/19/2019   LAPAROSCOPIC LYSIS OF ADHESIONS N/A 09/06/2019   Procedure: LAPAROSCOPIC OMENTAL HARVEST;  Surgeon: Derral Flick, MD;  [facility];  Service: General;  Laterality: N/A;   LEFT HEART CATH AND CORONARY ANGIOGRAPHY  N/A 08/10/2019   Procedure: LEFT HEART CATH AND CORONARY ANGIOGRAPHY;  Surgeon: Cody Das, MD;  [facility] INVASIVE CV LAB;  Service: Cardiovascular;  Laterality: N/A;   LEFT HEART CATHETERIZATION WITH CORONARY ANGIOGRAM N/A 02/27/2014   Procedure: LEFT HEART CATHETERIZATION WITH CORONARY ANGIOGRAM;  Surgeon: Jessica Morn, MD;  Location: [facility] CATH LAB;  Service: Cardiovascular;  Laterality: N/A;   MEDIASTINAL EXPLORATION N/A 09/06/2019   Procedure: MEDIASTINAL EXPLORATION;  Surgeon: Norita Beauvais, MD;  Location: [facility] OR;  Service: Thoracic;  Laterality: N/A;   PECTORALIS FLAP  09/06/2019   Procedure: Pectoralis ADVANCEMENT Flap;  Surgeon: Thornell Flirt, DO;  [facility]: [facility] OR;  Service: Plastics;;   PERCUTANEOUS CORONARY STENT INTERVENTION (PCI-S)  02/27/2014   Procedure: PERCUTANEOUS CORONARY STENT INTERVENTION (PCI-S);  Surgeon: Jessica Morn, MD;  Location: [facility] CATH LAB;  Service: Cardiovascular;;  rt PDA  3.0/28mm Promus stent   PERIPHERAL VASCULAR  INTERVENTION Left 08/12/2022   Procedure: PERIPHERAL VASCULAR INTERVENTION;  Surgeon: Kayla Part, MD;  Location: [facility] INVASIVE CV LAB;  Service: Cardiovascular;  Laterality: Left;   RADIAL ARTERY HARVEST Left 08/15/2019   Procedure: Radial Artery Harvest;  Surgeon: Rudine Cos, MD;  Location: [facility] OR;  Service: Open Heart Surgery;  Laterality: Left;   RIB PLATING N/A 09/06/2019   Procedure: STERNAL PLATING;  Surgeon: Norita Beauvais, MD;  Location: [facility] OR;  Service: Thoracic;  Laterality: N/A;   TEE WITHOUT CARDIOVERSION N/A 08/15/2019   Procedure: TRANSESOPHAGEAL ECHOCARDIOGRAM (TEE);  Surgeon: Rudine Cos, MD;  Location: [facility] OR;  Service: Open Heart Surgery;  Laterality: N/A;   TRACHEOSTOMY TUBE PLACEMENT  08/29/2019   Procedure: Tracheostomy;  Surgeon: Rudine Cos, MD;  Location: [facility] OR;  Service: Open Heart Surgery; Decannulated 6/10/20211   TRANSURETHRAL RESECTION OF PROSTATE  04/04/2012   Procedure: TRANSURETHRAL RESECTION OF THE PROSTATE WITH GYRUS INSTRUMENTS;  Surgeon: Edmund Gouge, MD;  Location: [facility];  Service: Urology;  Laterality: N/A;   TRANSURETHRAL RESECTION OF PROSTATE N/A 09/27/2014   Procedure: TRANSURETHRAL RESECTION OF THE PROSTATE ;  Surgeon: Annamarie Kid, MD;  Location: [facility] ORS;  Service: Urology;  Laterality: N/A;   UPPER GASTROINTESTINAL ENDOSCOPY     Current Outpatient Medications on File Prior to Visit  Medication Sig Dispense Refill   albuterol (VENTOLIN HFA) 108 (90 Base) MCG/ACT inhaler TAKE 2 PUFFS BY MOUTH EVERY 6 HOURS AS NEEDED FOR WHEEZE OR SHORTNESS OF BREATH 8.5 each 3   atorvastatin (LIPITOR) 20 MG tablet TAKE 1 TABLET BY MOUTH EVERY DAY IN THE EVENING 90 tablet 3   budesonide-formoterol (SYMBICORT) 160-4.5 MCG/ACT inhaler INHALE 2 PUFFS INTO THE LUNGS TWICE A DAY 10.2 g 6   calcitRIOL (ROCALTROL) 0.25 MCG capsule Take 0.25 mcg by mouth 3 (three) times a week.     clonazePAM (KLONOPIN) 0.5 MG disintegrating  tablet TAKE 1 TABLET BY MOUTH TWICE A DAY 60 tablet 5   clopidogrel (PLAVIX) 75 MG tablet Take 1 tablet (75 mg total) by mouth daily. 90 tablet 2   cromolyn (OPTICROM) 4 % ophthalmic solution Place 1 drop into both eyes every Wednesday. At bedtime     [employer] ACETAMINOPHEN 325 MG tablet TAKE 2 CAPS BY MOUTH EVERY 6 HOURS AS NEEDED FOR PAIN FOR TEMP 100F OR ABOVE (Patient taking differently: Take 325 mg by mouth every 6 (six) hours as needed for mild pain (pain score 1-3) or headache (temp over 100).) 60 tablet 0   [employer] SENNA PLUS 8.6-50 MG  tablet TAKE 2 TABLETS BY MOUTH AT BEDTIME FOR CONSTIPATION 60 tablet 1   ferrous sulfate 325 (65 FE) MG tablet Take 325 mg by mouth daily with breakfast.     fluticasone (FLONASE) 50 MCG/ACT nasal spray SPRAY 1 SPRAY INTO EACH NOSTRIL EVERY DAY 48 mL 3   furosemide (LASIX) 80 MG tablet Take 120 mg by mouth daily.     glimepiride (AMARYL) 2 MG tablet TAKE 1 TABLET BY MOUTH DAILY BEFORE BREAKFAST. 90 tablet 1   hydrALAZINE (APRESOLINE) 100 MG tablet TAKE 1 TABLET BY MOUTH 3 TIMES DAILY. 270 tablet 3   isosorbide dinitrate (ISORDIL) 30 MG tablet TAKE 1 TABLET BY MOUTH 3 TIMES DAILY. 270 tablet 1   lidocaine (LIDODERM) 5 % Place 1 patch onto the skin daily. Remove & Discard patch within 12 hours or as directed by MD 30 patch 0   methocarbamol (ROBAXIN) 500 MG tablet Take 1 tablet (500 mg total) by mouth 2 (two) times daily. 20 tablet 0   metolazone (ZAROXOLYN) 5 MG tablet Take 5 mg by mouth 2 (two) times a week.     metoprolol succinate (TOPROL-XL) 50 MG 24 hr tablet Take 3 tablets (150 mg total) by mouth daily. Take with or immediately following a meal. 270 tablet 3   Multiple Vitamin (MULTIVITAMIN) tablet Take 1 tablet by mouth daily.     nitroGLYCERIN (NITROSTAT) 0.4 MG SL tablet Place 0.4 mg under the tongue every 5 (five) minutes as needed for chest pain.     potassium chloride (KLOR-CON) 10 MEQ tablet Take 30 mEq by mouth daily.     PREVIDENT 5000 BOOSTER PLUS 1.1 %  PSTE TAKE 1 APPLICATION BY MOUTH EVERY EVENING. 100 g 3   sertraline (ZOLOFT) 25 MG tablet TAKE 1 TABLET BY MOUTH EVERY DAY 90 tablet 3   silodosin (RAPAFLO) 8 MG CAPS capsule Take 1 capsule (8 mg total) by mouth at bedtime. 90 capsule 3   Vitamin D, Ergocalciferol, (DRISDOL) 1.25 MG (50000 UNIT) CAPS capsule TAKE 1 CAPSULE (50,000 UNITS TOTAL) BY MOUTH EVERY 7 (SEVEN) DAYS 12 capsule 3   No current facility-administered medications on file prior to visit.    No Known Allergies Social History   Occupational History   Occupation: Retired  Tobacco Use   Smoking status: Former    Current packs/day: 0.00    Average packs/day: 0.5 packs/day for 20.0 years (10.0 ttl pk-yrs)    Types: Cigarettes    Start date: 75    Quit date: 1975    Years since quitting: 50.3   Smokeless tobacco: Never  Vaping Use   Vaping status: Never Used  Substance and Sexual Activity   Alcohol use: Not Currently    Comment: very rarely - every now and then 1 drink/1 year if that   Drug use: No   Sexual activity: Not Currently   Family History  Problem Relation Age of Onset   Diabetes Mother    Hypertension Mother    Diabetes Father    Diabetes Brother    Hypertension Brother    Bone cancer Brother    Diabetes Brother    Immunization History  Administered Date(s) Administered   Fluad Quad(high Dose 65+) 01/09/2019, 01/05/2022   Fluad Trivalent(High Dose 65+) 01/18/2023   Influenza, High Dose Seasonal PF 02/08/2017, 01/17/2018, 01/30/2020   Influenza,inj,Quad PF,6+ Mos 02/28/2014, 02/11/2015   Influenza-Unspecified 03/11/2017, 12/06/2020   PFIZER(Purple Top)SARS-COV-2 Vaccination 06/01/2019, 06/22/2019, 01/18/2020, 12/06/2020   Pneumococcal Conjugate-13 08/13/2015, 01/09/2019   Pneumococcal Polysaccharide-23 02/28/2014  Tdap 08/13/2015, 08/06/2022     Review of Systems: Negative except as noted in the HPI.   Objective: There were no vitals filed for this visit.  Christian [name] is a pleasant  78 y.o. male in NAD. AAO X 3.  Diabetic foot exam was performed with the following findings:   Vascular Examination: Capillary refill time immediate b/l. Vascular status intact b/l with palpable DP pulses; faintly palpable PT pulses. Pedal hair absent b/l. No edema. No pain with calf compression b/l. Skin temperature gradient WNL b/l. No cyanosis or clubbing noted b/l. No ischemia or gangrene b/l.   Neurological Examination: Protective sensation intact with 10 gram monofilament right lower extremity. Protective sensation diminished with 10 gram monofilament left lower extremity.  Dermatological Examination: Pedal skin with normal turgor, texture and tone b/l.  No open wounds. Interdigital maceration noted 1st-4th webspace(s) b/l. No blistering, no weeping, no open wounds.  Toenails 3-5 b/l, right great and right 2nd digit thick, discolored, elongated with subungual debris and pain on dorsal palpation.   Anonychia noted left great toe and L 2nd toe. Nailbed(s) epithelialized.   No hyperkeratotic nor porokeratotic lesions present on today's visit.  Musculoskeletal Examination: Muscle strength 5/5 to all lower extremity muscle groups bilaterally. Pes planus deformity noted bilateral LE.  Radiographs: None     Lab Results  Component Value Date   HGBA1C 7.3 (H) 04/12/2023   ADA Risk Categorization: High Risk  Patient has one or more of the following: Loss of protective sensation Absent pedal pulses Severe Foot deformity History of foot ulcer  Assessment: 1. Pain due to onychomycosis of toenails of both feet   2. Tinea pedis of both feet   3. Pes planus of both feet   4. Diabetic polyneuropathy associated with type 2 diabetes mellitus (HCC)     Plan: Meds ordered this encounter  Medications   Miconazole Nitrate (ATHLETES FOOT POWDER SPRAY) 2 % AERP    Sig: Apply between toes once daily.    Dispense:  128 g    Refill:  3  Diabetic foot examination performed today. All  patient's and/or POA's questions/concerns addressed on today's visit. Toenails 1-5 debrided in length and girth without incident. Continue foot and shoe inspections daily. Monitor blood glucose per PCP/Endocrinologist's recommendations. Continue soft, supportive shoe gear daily. Report any pedal injuries to medical professional. Rx written for Miconazole Spray Powder 2% antifungal spray powder. Spray between toes once daily. Dry well between toes and under toes after bath/shower. Return in about 3 months (around 11/03/2023).  Luella Sager, DPM      [place] LOCATION: [address] [place] [address]                   Office [phone]   [facility] LOCATION: [address] [place], [place] [address] Office [phone]

## 2023-08-24 ENCOUNTER — Telehealth: Payer: Self-pay | Admitting: Cardiology

## 2023-08-24 ENCOUNTER — Telehealth: Payer: Self-pay | Admitting: Physician Assistant

## 2023-08-24 MED ORDER — ATORVASTATIN CALCIUM 20 MG PO TABS: 20.0000 mg | ORAL_TABLET | Freq: Every evening | ORAL | 2 refills | Status: DC

## 2023-08-24 NOTE — Telephone Encounter (Signed)
 RX sent to requested Pharmacy

## 2023-08-24 NOTE — Telephone Encounter (Signed)
 Called patients wife and let her know that unfortunately the Pet Scan will have to be cancelled until a approval can be reached and then we will call back with details and everything they need for the scan

## 2023-08-24 NOTE — Telephone Encounter (Signed)
*  STAT* If patient is at the pharmacy, call can be transferred to refill team.   1. Which medications need to be refilled? (please list name of each medication and dose if known)  atorvastatin  (LIPITOR) 20 MG tablet  2. Which pharmacy/location (including street and city if local pharmacy) is medication to be sent to? [id number] - [place], [place] - [address] AT [facility] [address] Phone: [phone]  Fax: [phone]     3. Do they need a 30 day or 90 day supply? 90  Pt is out of medication

## 2023-08-24 NOTE — Telephone Encounter (Signed)
 Pt. Wife would like to discuss PET Scan ordered and sched

## 2023-08-24 NOTE — Telephone Encounter (Signed)
 Pt wife called about the PET scan to be done on 08-27-23 at [facility] she has some questions

## 2023-08-26 ENCOUNTER — Telehealth: Payer: Self-pay | Admitting: Physician Assistant

## 2023-08-26 NOTE — Telephone Encounter (Signed)
 Patient wife called to see where we were in getting the PET scan approved by ins. She states that the patient was to have the PET scan on Friday 08-27-23. The insurance did not approve the PET scan. Please call

## 2023-08-27 ENCOUNTER — Encounter (HOSPITAL_COMMUNITY)

## 2023-08-30 ENCOUNTER — Emergency Department (HOSPITAL_COMMUNITY)

## 2023-08-30 ENCOUNTER — Other Ambulatory Visit: Payer: Self-pay

## 2023-08-30 ENCOUNTER — Encounter (HOSPITAL_COMMUNITY): Payer: Self-pay | Admitting: Emergency Medicine

## 2023-08-30 ENCOUNTER — Ambulatory Visit: Payer: Self-pay

## 2023-08-30 ENCOUNTER — Inpatient Hospital Stay (HOSPITAL_COMMUNITY)
Admission: EM | Admit: 2023-08-30 | Discharge: 2023-10-02 | DRG: 853 | Disposition: A | Discharge status: Skilled Nursing Facility | Attending: Internal Medicine | Admitting: Internal Medicine

## 2023-08-30 DIAGNOSIS — L0291 Cutaneous abscess, unspecified: Secondary | ICD-10-CM | POA: Diagnosis not present

## 2023-08-30 DIAGNOSIS — K5730 Diverticulosis of large intestine without perforation or abscess without bleeding: Secondary | ICD-10-CM | POA: Diagnosis not present

## 2023-08-30 DIAGNOSIS — K567 Ileus, unspecified: Secondary | ICD-10-CM | POA: Diagnosis not present

## 2023-08-30 DIAGNOSIS — N170 Acute kidney failure with tubular necrosis: Secondary | ICD-10-CM | POA: Diagnosis not present

## 2023-08-30 DIAGNOSIS — E1122 Type 2 diabetes mellitus with diabetic chronic kidney disease: Secondary | ICD-10-CM | POA: Diagnosis present

## 2023-08-30 DIAGNOSIS — N186 End stage renal disease: Secondary | ICD-10-CM

## 2023-08-30 DIAGNOSIS — J9600 Acute respiratory failure, unspecified whether with hypoxia or hypercapnia: Secondary | ICD-10-CM | POA: Diagnosis not present

## 2023-08-30 DIAGNOSIS — E43 Unspecified severe protein-calorie malnutrition: Secondary | ICD-10-CM | POA: Diagnosis not present

## 2023-08-30 DIAGNOSIS — M25572 Pain in left ankle and joints of left foot: Secondary | ICD-10-CM | POA: Diagnosis not present

## 2023-08-30 DIAGNOSIS — K3580 Unspecified acute appendicitis: Secondary | ICD-10-CM | POA: Diagnosis not present

## 2023-08-30 DIAGNOSIS — Z833 Family history of diabetes mellitus: Secondary | ICD-10-CM

## 2023-08-30 DIAGNOSIS — Z8673 Personal history of transient ischemic attack (TIA), and cerebral infarction without residual deficits: Secondary | ICD-10-CM

## 2023-08-30 DIAGNOSIS — D631 Anemia in chronic kidney disease: Secondary | ICD-10-CM | POA: Diagnosis not present

## 2023-08-30 DIAGNOSIS — K37 Unspecified appendicitis: Secondary | ICD-10-CM | POA: Diagnosis not present

## 2023-08-30 DIAGNOSIS — I472 Ventricular tachycardia, unspecified: Secondary | ICD-10-CM | POA: Diagnosis not present

## 2023-08-30 DIAGNOSIS — N281 Cyst of kidney, acquired: Secondary | ICD-10-CM | POA: Diagnosis not present

## 2023-08-30 DIAGNOSIS — I130 Hypertensive heart and chronic kidney disease with heart failure and stage 1 through stage 4 chronic kidney disease, or unspecified chronic kidney disease: Secondary | ICD-10-CM | POA: Diagnosis not present

## 2023-08-30 DIAGNOSIS — E119 Type 2 diabetes mellitus without complications: Secondary | ICD-10-CM | POA: Diagnosis not present

## 2023-08-30 DIAGNOSIS — I255 Ischemic cardiomyopathy: Secondary | ICD-10-CM | POA: Diagnosis present

## 2023-08-30 DIAGNOSIS — Z951 Presence of aortocoronary bypass graft: Secondary | ICD-10-CM

## 2023-08-30 DIAGNOSIS — Z8701 Personal history of pneumonia (recurrent): Secondary | ICD-10-CM

## 2023-08-30 DIAGNOSIS — K3533 Acute appendicitis with perforation and localized peritonitis, with abscess: Secondary | ICD-10-CM | POA: Diagnosis present

## 2023-08-30 DIAGNOSIS — E1142 Type 2 diabetes mellitus with diabetic polyneuropathy: Secondary | ICD-10-CM | POA: Diagnosis present

## 2023-08-30 DIAGNOSIS — I2510 Atherosclerotic heart disease of native coronary artery without angina pectoris: Secondary | ICD-10-CM | POA: Diagnosis not present

## 2023-08-30 DIAGNOSIS — E871 Hypo-osmolality and hyponatremia: Secondary | ICD-10-CM | POA: Diagnosis not present

## 2023-08-30 DIAGNOSIS — I25110 Atherosclerotic heart disease of native coronary artery with unstable angina pectoris: Secondary | ICD-10-CM | POA: Diagnosis not present

## 2023-08-30 DIAGNOSIS — Z992 Dependence on renal dialysis: Secondary | ICD-10-CM

## 2023-08-30 DIAGNOSIS — K353 Acute appendicitis with localized peritonitis, without perforation or gangrene: Secondary | ICD-10-CM | POA: Diagnosis not present

## 2023-08-30 DIAGNOSIS — D75839 Thrombocytosis, unspecified: Secondary | ICD-10-CM | POA: Diagnosis not present

## 2023-08-30 DIAGNOSIS — A419 Sepsis, unspecified organism: Secondary | ICD-10-CM | POA: Diagnosis not present

## 2023-08-30 DIAGNOSIS — R1031 Right lower quadrant pain: Secondary | ICD-10-CM | POA: Diagnosis not present

## 2023-08-30 DIAGNOSIS — D62 Acute posthemorrhagic anemia: Secondary | ICD-10-CM | POA: Diagnosis present

## 2023-08-30 DIAGNOSIS — N184 Chronic kidney disease, stage 4 (severe): Secondary | ICD-10-CM | POA: Diagnosis present

## 2023-08-30 DIAGNOSIS — E1151 Type 2 diabetes mellitus with diabetic peripheral angiopathy without gangrene: Secondary | ICD-10-CM | POA: Diagnosis present

## 2023-08-30 DIAGNOSIS — E872 Acidosis, unspecified: Secondary | ICD-10-CM | POA: Diagnosis not present

## 2023-08-30 DIAGNOSIS — B9620 Unspecified Escherichia coli [E. coli] as the cause of diseases classified elsewhere: Secondary | ICD-10-CM | POA: Diagnosis present

## 2023-08-30 DIAGNOSIS — Z87891 Personal history of nicotine dependence: Secondary | ICD-10-CM

## 2023-08-30 DIAGNOSIS — I159 Secondary hypertension, unspecified: Secondary | ICD-10-CM | POA: Diagnosis not present

## 2023-08-30 DIAGNOSIS — I10 Essential (primary) hypertension: Secondary | ICD-10-CM | POA: Diagnosis not present

## 2023-08-30 DIAGNOSIS — I252 Old myocardial infarction: Secondary | ICD-10-CM

## 2023-08-30 DIAGNOSIS — T8141XA Infection following a procedure, superficial incisional surgical site, initial encounter: Secondary | ICD-10-CM | POA: Diagnosis not present

## 2023-08-30 DIAGNOSIS — I120 Hypertensive chronic kidney disease with stage 5 chronic kidney disease or end stage renal disease: Secondary | ICD-10-CM | POA: Diagnosis not present

## 2023-08-30 DIAGNOSIS — Z791 Long term (current) use of non-steroidal anti-inflammatories (NSAID): Secondary | ICD-10-CM

## 2023-08-30 DIAGNOSIS — E8809 Other disorders of plasma-protein metabolism, not elsewhere classified: Secondary | ICD-10-CM | POA: Diagnosis present

## 2023-08-30 DIAGNOSIS — Z6830 Body mass index (BMI) 30.0-30.9, adult: Secondary | ICD-10-CM | POA: Diagnosis not present

## 2023-08-30 DIAGNOSIS — E869 Volume depletion, unspecified: Secondary | ICD-10-CM | POA: Diagnosis not present

## 2023-08-30 DIAGNOSIS — E7800 Pure hypercholesterolemia, unspecified: Secondary | ICD-10-CM | POA: Diagnosis not present

## 2023-08-30 DIAGNOSIS — R011 Cardiac murmur, unspecified: Secondary | ICD-10-CM | POA: Diagnosis present

## 2023-08-30 DIAGNOSIS — R8271 Bacteriuria: Secondary | ICD-10-CM | POA: Diagnosis present

## 2023-08-30 DIAGNOSIS — F410 Panic disorder [episodic paroxysmal anxiety] without agoraphobia: Secondary | ICD-10-CM | POA: Diagnosis present

## 2023-08-30 DIAGNOSIS — Z9889 Other specified postprocedural states: Secondary | ICD-10-CM | POA: Diagnosis not present

## 2023-08-30 DIAGNOSIS — E669 Obesity, unspecified: Secondary | ICD-10-CM | POA: Diagnosis present

## 2023-08-30 DIAGNOSIS — Z7902 Long term (current) use of antithrombotics/antiplatelets: Secondary | ICD-10-CM

## 2023-08-30 DIAGNOSIS — I471 Supraventricular tachycardia, unspecified: Secondary | ICD-10-CM | POA: Diagnosis not present

## 2023-08-30 DIAGNOSIS — R188 Other ascites: Secondary | ICD-10-CM | POA: Diagnosis not present

## 2023-08-30 DIAGNOSIS — Z8249 Family history of ischemic heart disease and other diseases of the circulatory system: Secondary | ICD-10-CM

## 2023-08-30 DIAGNOSIS — Z01810 Encounter for preprocedural cardiovascular examination: Secondary | ICD-10-CM | POA: Diagnosis not present

## 2023-08-30 DIAGNOSIS — Y838 Other surgical procedures as the cause of abnormal reaction of the patient, or of later complication, without mention of misadventure at the time of the procedure: Secondary | ICD-10-CM | POA: Diagnosis not present

## 2023-08-30 DIAGNOSIS — Z86711 Personal history of pulmonary embolism: Secondary | ICD-10-CM

## 2023-08-30 DIAGNOSIS — E876 Hypokalemia: Secondary | ICD-10-CM | POA: Diagnosis present

## 2023-08-30 DIAGNOSIS — K3532 Acute appendicitis with perforation and localized peritonitis, without abscess: Secondary | ICD-10-CM | POA: Diagnosis not present

## 2023-08-30 DIAGNOSIS — Z9079 Acquired absence of other genital organ(s): Secondary | ICD-10-CM

## 2023-08-30 DIAGNOSIS — N400 Enlarged prostate without lower urinary tract symptoms: Secondary | ICD-10-CM | POA: Diagnosis present

## 2023-08-30 DIAGNOSIS — N179 Acute kidney failure, unspecified: Secondary | ICD-10-CM | POA: Diagnosis not present

## 2023-08-30 DIAGNOSIS — Z7951 Long term (current) use of inhaled steroids: Secondary | ICD-10-CM

## 2023-08-30 DIAGNOSIS — Z7984 Long term (current) use of oral hypoglycemic drugs: Secondary | ICD-10-CM

## 2023-08-30 DIAGNOSIS — I4892 Unspecified atrial flutter: Secondary | ICD-10-CM | POA: Diagnosis not present

## 2023-08-30 DIAGNOSIS — M1990 Unspecified osteoarthritis, unspecified site: Secondary | ICD-10-CM | POA: Diagnosis present

## 2023-08-30 DIAGNOSIS — I480 Paroxysmal atrial fibrillation: Secondary | ICD-10-CM | POA: Diagnosis present

## 2023-08-30 DIAGNOSIS — K429 Umbilical hernia without obstruction or gangrene: Secondary | ICD-10-CM | POA: Diagnosis present

## 2023-08-30 DIAGNOSIS — I9581 Postprocedural hypotension: Secondary | ICD-10-CM | POA: Diagnosis not present

## 2023-08-30 DIAGNOSIS — J9811 Atelectasis: Secondary | ICD-10-CM | POA: Diagnosis not present

## 2023-08-30 DIAGNOSIS — Z8674 Personal history of sudden cardiac arrest: Secondary | ICD-10-CM

## 2023-08-30 DIAGNOSIS — F03A4 Unspecified dementia, mild, with anxiety: Secondary | ICD-10-CM | POA: Diagnosis not present

## 2023-08-30 DIAGNOSIS — Z95828 Presence of other vascular implants and grafts: Secondary | ICD-10-CM | POA: Diagnosis not present

## 2023-08-30 DIAGNOSIS — M549 Dorsalgia, unspecified: Secondary | ICD-10-CM | POA: Diagnosis not present

## 2023-08-30 DIAGNOSIS — R8281 Pyuria: Secondary | ICD-10-CM | POA: Diagnosis present

## 2023-08-30 DIAGNOSIS — I509 Heart failure, unspecified: Secondary | ICD-10-CM | POA: Diagnosis present

## 2023-08-30 DIAGNOSIS — E1169 Type 2 diabetes mellitus with other specified complication: Secondary | ICD-10-CM | POA: Diagnosis not present

## 2023-08-30 DIAGNOSIS — E860 Dehydration: Secondary | ICD-10-CM | POA: Diagnosis present

## 2023-08-30 DIAGNOSIS — E785 Hyperlipidemia, unspecified: Secondary | ICD-10-CM | POA: Diagnosis not present

## 2023-08-30 DIAGNOSIS — Z79899 Other long term (current) drug therapy: Secondary | ICD-10-CM

## 2023-08-30 LAB — COMPREHENSIVE METABOLIC PANEL WITH GFR
ALT: 22 U/L (ref 0–44)
AST: 24 U/L (ref 15–41)
Albumin: 3.2 g/dL — ABNORMAL LOW (ref 3.5–5.0)
Alkaline Phosphatase: 66 U/L (ref 38–126)
Anion gap: 15 (ref 5–15)
BUN: 86 mg/dL — ABNORMAL HIGH (ref 8–23)
CO2: 24 mmol/L (ref 22–32)
Calcium: 9.5 mg/dL (ref 8.9–10.3)
Chloride: 99 mmol/L (ref 98–111)
Creatinine, Ser: 3.55 mg/dL — ABNORMAL HIGH (ref 0.61–1.24)
GFR, Estimated: 17 mL/min — ABNORMAL LOW (ref >60)
Glucose, Bld: 74 mg/dL (ref 70–99)
Potassium: 3.4 mmol/L — ABNORMAL LOW (ref 3.5–5.1)
Sodium: 138 mmol/L (ref 135–145)
Total Bilirubin: 0.7 mg/dL (ref 0.0–1.2)
Total Protein: 7.5 g/dL (ref 6.5–8.1)

## 2023-08-30 LAB — URINALYSIS, ROUTINE W REFLEX MICROSCOPIC
Bilirubin Urine: NEGATIVE
Glucose, UA: NEGATIVE mg/dL
Hgb urine dipstick: NEGATIVE
Ketones, ur: NEGATIVE mg/dL
Nitrite: NEGATIVE
Protein, ur: NEGATIVE mg/dL
Specific Gravity, Urine: 1.009 (ref 1.005–1.030)
pH: 5 (ref 5.0–8.0)

## 2023-08-30 LAB — CBC
HCT: 44 % (ref 39.0–52.0)
Hemoglobin: 14.2 g/dL (ref 13.0–17.0)
MCH: 33.7 pg (ref 26.0–34.0)
MCHC: 32.3 g/dL (ref 30.0–36.0)
MCV: 104.5 fL — ABNORMAL HIGH (ref 80.0–100.0)
Platelets: 220 10*3/uL (ref 150–400)
RBC: 4.21 MIL/uL — ABNORMAL LOW (ref 4.22–5.81)
RDW: 14.1 % (ref 11.5–15.5)
WBC: 17 10*3/uL — ABNORMAL HIGH (ref 4.0–10.5)
nRBC: 0 % (ref 0.0–0.2)

## 2023-08-30 LAB — LIPASE, BLOOD: Lipase: 28 U/L (ref 11–51)

## 2023-08-30 MED ORDER — PIPERACILLIN-TAZOBACTAM 3.375 G IVPB
3.3750 g | Freq: Once | INTRAVENOUS | Status: AC
  Administered 2023-08-30: 3.375 g via INTRAVENOUS
  Filled 2023-08-30: qty 50

## 2023-08-30 NOTE — ED Notes (Signed)
 Patient transported to CT

## 2023-08-30 NOTE — Telephone Encounter (Signed)
 Copied from CRM [id number]. Topic: Clinical - Red Word Triage >> Aug 30, 2023  3:31 PM [place] La N wrote: [facility] that prompted transfer to Nurse Triage: Stomach hurts and every time you stand up or move it hurts real bad    Chief Complaint: Abdominal Pain Symptoms: severe pain Frequency: 3 days ago Pertinent Negatives: Patient denies vomiting, back pain, fevers,  Disposition: [x] ED /[] Urgent Care (no appt availability in office) / [] Appointment(In office/virtual)/ []  [place] [facility]/ [] Home Care/ [] Refused Recommended Disposition /[] [place] [facility]/ []  Follow-up with PCP Additional Notes: Patient states 3 days ago extreme abdominal pain, went away and started back today extremely bad. Patient is very tearful and he states that he is hurting so bad he can't stand it.  Patient sounds like he is in severe pain.  He states he had one episode of vomiting 2 days ago.  Pain has been going on constantly severely for the past 2.5 to 3 hours now. Patient states he has never felt like this before. He denies any vomiting at this time, back pain, fever. He states that he just has bad pain in his lower middle abdominal area. He added that he hadn't a good bowel movement---didn't specify in how long--advised him that this also warrants him going to the ER to be evaluated further at this time. Patient is advised that being in this much pain and for it to be constant for this amount of time---it is recommended that he goes to the Emergency Room.  This RN offered to call 911 for an ambulance multiple times but patient refused wanting an ambulance called and states that he is going to get his wife to take him to the Emergency Room.  This RN asked if he was sure and he replied yes.  This RN let him know as well that at any point on the way to the Emergency Room that they could pull over and call 911 if they needed to.  Patient verbalized understanding.    Reason for Disposition . [1] SEVERE pain (e.g.,  excruciating) AND [2] present > 1 hour  Answer Assessment - Initial Assessment Questions 1. LOCATION: "Where does it hurt?"      Lower belly just below belly button 2. RADIATION: "Does the pain shoot anywhere else?" (e.g., chest, back)     --- 3. ONSET: "When did the pain begin?" (Minutes, hours or days ago)      --- 4. SUDDEN: "Gradual or sudden onset?"     Over the past 3 days 5. PATTERN "Does the pain come and go, or is it constant?"    - If it comes and goes: "How long does it last?" "Do you have pain now?"     (Note: Comes and goes means the pain is intermittent. It goes away completely between bouts.)    - If constant: "Is it getting better, staying the same, or getting worse?"      (Note: Constant means the pain never goes away completely; most serious pain is constant and gets worse.)      Started 2.5-3 hours ago 6. SEVERITY: "How bad is the pain?"  (e.g., Scale 1-10; mild, moderate, or severe)    - MILD (1-3): Doesn't interfere with normal activities, abdomen soft and not tender to touch.     - MODERATE (4-7): Interferes with normal activities or awakens from sleep, abdomen tender to touch.     - SEVERE (8-10): Excruciating pain, doubled over, unable to do any normal activities.  Patient tearful and stating it hurts so bad  7. RECURRENT SYMPTOM: "Have you ever had this type of stomach pain before?" If Yes, ask: "When was the last time?" and "What happened that time?"      No 8. CAUSE: "What do you think is causing the stomach pain?"     unknown 9. RELIEVING/AGGRAVATING FACTORS: "What makes it better or worse?" (e.g., antacids, bending or twisting motion, bowel movement)     --- 10. OTHER SYMPTOMS: "Do you have any other symptoms?" (e.g., back pain, diarrhea, fever, urination pain, vomiting)       ----  Protocols used: Abdominal Pain - Male-A-AH

## 2023-08-30 NOTE — ED Triage Notes (Signed)
 Pt here from home with c/o lower abd pain that started on Friday after eating , pt unable to remember last BM , and is soft but slightly tender all over to palpation

## 2023-08-30 NOTE — ED Provider Notes (Signed)
 [place] [facility] AT [facility] Provider Note  History  Chief Complaint:  Abdominal Pain  The history is provided by the patient.  Abdominal Pain Pain location:  Periumbilical and RLQ Pain quality: aching, bloating, cramping and fullness   Pain radiates to:  Does not radiate Pain severity:  Moderate Onset quality:  Gradual Duration:  3 days Timing:  Constant Progression:  Worsening Chronicity:  New Context: eating and previous surgery   Context: not trauma   Worsened by:  Deep breathing, movement and palpation Associated symptoms: nausea   Associated symptoms: no chest pain, no constipation, no cough, no fever and no vomiting      Christian [name] is a 78 y.o. male with a history of anemia, CKD who presents as above.  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   Diabetic polyneuropathy    Dry eyes left   Elevated liver enzymes 08/09/2019   Hiatal hernia    History of coronary artery stent placement    History of pulmonary embolism 08/09/2019   HTN (hypertension) 11/12/2009   Hyperlipidemia associated with type 2 diabetes mellitus 11/12/2009   Incomplete bladder emptying    Ischemic cardiomyopathy    Left leg weakness 11/23/2018   Mild dementia, 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 06/09/2023   Neck swelling 05/16/2021   Nocturia    NSTEMI (non-ST elevation myocardial infarction) 08/09/2019   tamponade   Pneumonia    as a child   Problems with swallowing    pt states test at [facility] approx 2012 shows a gastric valve  dysfunction--  eats small bites and drink liquids slowly   Pruritus 11/28/2019   Pulmonary edema 10/03/2020   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 LIMA TO LAD, RIMA TO PDA, RADIAL ARTERY TO CIRC AND SEQUENTIALLY TO OM1.   Shortness of breath 11/12/2009   Sleep difficulties 05/30/2020   Swelling of lower extremity    Type II diabetes mellitus 10/10/2014     Past Surgical History:  Procedure Laterality Date   A/V FISTULAGRAM Left 08/12/2022   Procedure: A/V Fistulagram;  Surgeon: Kayla Part, MD;  Location: [facility] INVASIVE CV LAB;  Service: Cardiovascular;  Laterality: Left;   APPLICATION OF WOUND VAC N/A 08/24/2019   Procedure: APPLICATION OF WOUND VAC;  Surgeon: Rudine Cos, MD;  Location: [facility] OR;  Service: Thoracic;  Laterality: N/A;   APPLICATION OF WOUND VAC  08/29/2019   Procedure: Wound Vac change;  Surgeon: Rudine Cos, MD;  Location: [facility] OR;  Service: Open Heart Surgery;;   APPLICATION OF WOUND VAC N/A 09/04/2019   Procedure: Application Of Wound Vac;  Surgeon: Norita Beauvais, MD;  Location: [facility] OR;  Service: Open Heart Surgery;  Laterality: N/A;   APPLICATION OF WOUND VAC N/A 09/06/2019   Procedure: APPLICATION OF ACELL, APPLICATION OF WOUND VAC USING PREVENA INCISIONAL  DRESSING;  Surgeon: Thornell Flirt, DO;  Location: [facility] OR;  Service: Plastics;  Laterality: N/A;   AV FISTULA PLACEMENT Left 09/18/2020   Procedure: ARTERIOVENOUS (AV) FISTULA CREATION LEFT VERSUS GRAFT;  Surgeon: Margherita Shell, MD;  Location: [facility] OR;  Service: Vascular;  Laterality:  Left;   BASCILIC VEIN TRANSPOSITION Left 10/31/2020   Procedure: LEFT SECOND STAGE BASCILIC VEIN TRANSPOSITION;  Surgeon: Margherita Shell, MD;  Location: [facility] OR;  Service: Vascular;  Laterality: Left;   CARDIAC CATHETERIZATION     CORONARY ARTERY BYPASS GRAFT N/A 08/15/2019   Procedure: CORONARY ARTERY BYPASS GRAFTING (CABG), x 4 using bilateral IMAs and left radial artery .  LIMA TO LAD, RIMA TO PDA, RADIAL ARTERY TO CIRC AND SEQUENTIALLY TO OM1.;  Surgeon: Rudine Cos, MD;  Location: [facility] OR;   Service: Open Heart Surgery;  Laterality: N/A;   CORONARY STENT PLACEMENT  02/27/2014   distal rt/pd coronary       dr Berry Bristol   CYSTO/ BLADDER BIOPSY'S/ CAUTHERIZATION  01-14-2004  DR Isla Mari   EXPLORATION POST OPERATIVE OPEN HEART N/A 08/16/2019   Procedure: Chest Closure S?P CABG WITH APPLICATION OF PREVENA  INCISIONAL WOUND VAC;  Surgeon: Rudine Cos, MD;  Location: [facility] OR;  Service: Open Heart Surgery;  Laterality: N/A;   EXPLORATION POST OPERATIVE OPEN HEART N/A 08/21/2019   Procedure: CHEST WASHOUT S/P OPEN CHEST;  Surgeon: Rudine Cos, MD;  Location: [facility] OR;  Service: Open Heart Surgery;  Laterality: N/A;  Open chest with Esmark dressing with Ioban sealant coverage.   EXPLORATION POST OPERATIVE OPEN HEART N/A 08/18/2019   Procedure: EXPLORATION POST OPERATIVE OPEN HEART (performed 04/23 on unit);  Surgeon: Rudine Cos, MD;  Location: [facility] OR;  Service: Open Heart Surgery;  Laterality: N/A;   EXPLORATION POST OPERATIVE OPEN HEART N/A 08/24/2019   Procedure: CHEST WASHOUT POST OPERATIVE OPEN HEART;  Surgeon: Rudine Cos, MD;  [facility];  Service: Open Heart Surgery;  Laterality: N/A;   EXPLORATION POST OPERATIVE OPEN HEART N/A 08/29/2019   Procedure: CHEST WOUND WASHOUT POST OPERATIVE OPEN HEART;  Surgeon: Rudine Cos, MD;  [facility]: [facility];  Service: Open Heart Surgery;  Laterality: N/A;   EXPLORATION POST OPERATIVE OPEN HEART N/A 09/04/2019   Procedure: MEDIASTINAL EXPLORATION WITH STERNAL WOUND IRRIGATION;  Surgeon: Norita Beauvais, MD;  Location: [facility] OR;  Service: Open Heart Surgery;  Laterality: N/A;   EXPLORATION POST OPERATIVE OPEN HEART N/A 09/14/2019   Procedure: EVACUATION OF HEMATOMA;  Surgeon: Norita Beauvais, MD;  Location: [facility] OR;  Service: Open Heart Surgery;  Laterality: N/A;   IR FLUORO GUIDE CV LINE LEFT  09/19/2019   IR GASTROSTOMY TUBE MOD SED  10/04/2019   IR GASTROSTOMY TUBE REMOVAL  11/29/2019   IR PATIENT EVAL TECH 0-60 MINS  09/29/2019   IR US   GUIDE VASC ACCESS LEFT  09/19/2019   LAPAROSCOPIC LYSIS OF ADHESIONS N/A 09/06/2019   Procedure: LAPAROSCOPIC OMENTAL HARVEST;  Surgeon: Derral Flick, MD;  [facility]: [facility];  Service: General;  Laterality: N/A;   LEFT HEART CATH AND CORONARY ANGIOGRAPHY N/A 08/10/2019   Procedure: LEFT HEART CATH AND CORONARY ANGIOGRAPHY;  Surgeon: Cody Das, MD;  [facility]: [facility] INVASIVE CV LAB;  Service: Cardiovascular;  Laterality: N/A;   LEFT HEART CATHETERIZATION WITH CORONARY ANGIOGRAM N/A 02/27/2014   Procedure: LEFT HEART CATHETERIZATION WITH CORONARY ANGIOGRAM;  Surgeon: Jessica Morn, MD;  Location: [facility] CATH LAB;  Service: Cardiovascular;  Laterality: N/A;   MEDIASTINAL EXPLORATION N/A 09/06/2019   Procedure: MEDIASTINAL EXPLORATION;  Surgeon: Norita Beauvais, MD;  Location: [facility] OR;  Service: Thoracic;  Laterality: N/A;   PECTORALIS FLAP  09/06/2019   Procedure: Pectoralis ADVANCEMENT Flap;  Surgeon: Thornell Flirt, DO;  Location: [facility] OR;  Service:  Plastics;;   PERCUTANEOUS CORONARY STENT INTERVENTION (PCI-S)  02/27/2014   Procedure: PERCUTANEOUS CORONARY STENT INTERVENTION (PCI-S);  Surgeon: Jessica Morn, MD;  Location: [facility] CATH LAB;  Service: Cardiovascular;;  rt PDA  3.0/28mm Promus stent   PERIPHERAL VASCULAR INTERVENTION Left 08/12/2022   Procedure: PERIPHERAL VASCULAR INTERVENTION;  Surgeon: Kayla Part, MD;  Location: [facility] INVASIVE CV LAB;  Service: Cardiovascular;  Laterality: Left;   RADIAL ARTERY HARVEST Left 08/15/2019   Procedure: Radial Artery Harvest;  Surgeon: Rudine Cos, MD;  Location: [facility] OR;  Service: Open Heart Surgery;  Laterality: Left;   RIB PLATING N/A 09/06/2019   Procedure: STERNAL PLATING;  Surgeon: Norita Beauvais, MD;  Location: [facility] OR;  Service: Thoracic;  Laterality: N/A;   TEE WITHOUT CARDIOVERSION N/A 08/15/2019   Procedure: TRANSESOPHAGEAL ECHOCARDIOGRAM (TEE);  Surgeon: Rudine Cos, MD;  Location: [facility] OR;  Service: Open Heart Surgery;   Laterality: N/A;   TRACHEOSTOMY TUBE PLACEMENT  08/29/2019   Procedure: Tracheostomy;  Surgeon: Rudine Cos, MD;  Location: [facility] OR;  Service: Open Heart Surgery; Decannulated 6/10/20211   TRANSURETHRAL RESECTION OF PROSTATE  04/04/2012   Procedure: TRANSURETHRAL RESECTION OF THE PROSTATE WITH GYRUS INSTRUMENTS;  Surgeon: Edmund Gouge, MD;  Location: [facility];  Service: Urology;  Laterality: N/A;   TRANSURETHRAL RESECTION OF PROSTATE N/A 09/27/2014   Procedure: TRANSURETHRAL RESECTION OF THE PROSTATE ;  Surgeon: Annamarie Kid, MD;  Location: [facility] ORS;  Service: Urology;  Laterality: N/A;   UPPER GASTROINTESTINAL ENDOSCOPY      Family History  Problem Relation Age of Onset   Diabetes Mother    Hypertension Mother    Diabetes Father    Diabetes Brother    Hypertension Brother    Bone cancer Brother    Diabetes Brother     Social History   Tobacco Use   Smoking status: Former    Current packs/day: 0.00    Average packs/day: 0.5 packs/day for 20.0 years (10.0 ttl pk-yrs)    Types: Cigarettes    Start date: 20    Quit date: 1975    Years since quitting: 50.3   Smokeless tobacco: Never  Vaping Use   Vaping status: Never Used  Substance Use Topics   Alcohol use: Not Currently    Comment: very rarely - every now and then 1 drink/1 year if that   Drug use: No    Review of Systems  Review of Systems  Constitutional:  Negative for fever.  Respiratory:  Negative for cough.   Cardiovascular:  Negative for chest pain.  Gastrointestinal:  Positive for abdominal pain and nausea. Negative for constipation and vomiting.     Reviewed and documented in HPI if pertinent.   Physical Exam   ED Triage Vitals  Encounter Vitals Group     BP 08/30/23 1645 102/70     Systolic BP Percentile --      Diastolic BP Percentile --      Pulse Rate 08/30/23 1645 (!) 101     Resp 08/30/23 1645 18     Temp 08/30/23 1645 98.3 F (36.8 C)     Temp Source 08/30/23  1645 Oral     SpO2 08/30/23 1645 95 %     Weight --      Height --      Head Circumference --      Peak Flow --      Pain Score 08/30/23 1703 5     Pain Loc --  Pain Education --      Exclude from Growth Chart --     Physical Exam Vitals and nursing note reviewed.  Constitutional:      General: He is not in acute distress.    Appearance: He is well-developed.  HENT:     Head: Normocephalic and atraumatic.  Eyes:     Conjunctiva/sclera: Conjunctivae normal.  Cardiovascular:     Rate and Rhythm: Normal rate and regular rhythm.     Heart sounds: No murmur heard. Pulmonary:     Effort: Pulmonary effort is normal. No respiratory distress.     Breath sounds: Normal breath sounds.  Abdominal:     Palpations: Abdomen is soft.     Tenderness: There is generalized abdominal tenderness and tenderness in the right lower quadrant and periumbilical area. There is rebound. There is no guarding.  Musculoskeletal:        General: No swelling.     Cervical back: Neck supple.  Skin:    General: Skin is warm and dry.     Capillary Refill: Capillary refill takes less than 2 seconds.  Neurological:     Mental Status: He is alert.  Psychiatric:        Mood and Affect: Mood normal.       Procedures   Procedures  ED Course - Medical Decision Making  Brief Overview Christian [name] is a 78 y.o. male who presents as per above.  I have reviewed the nursing documentation for past medical history, family history, and social history and agree.  I have reviewed the patient's vital signs. Mild tachycardia.  Initial Differential Diagnoses: I am primarily concerned for appendicitis, colitis, electrolyte derangements, UTI.  Therapies: These medications and interventions were provided for the patient while in the ED.  Medications  piperacillin-tazobactam (ZOSYN) IVPB 3.375 g (3.375 g Intravenous New Bag/Given 08/30/23 2332)    Testing Results: On my interpretation labs are significant  for : Leukocytosis Cr 3.55  On my interpretation imaging is significant for: CT concerning for appendicitis  See the EMR for full details regarding lab and imaging results.  Medical Decision Making 78 year old male who presents the emergency department for periumbilical abdominal pain as well as right lower quadrant pain.  On exam patient does have focal right lower quadrant tenderness as well as rebound tenderness.  Patient is afebrile.  Mildly tachycardic to 100.  Saturating well on room air.  Appears comfortable.  Initial concerns for appendicitis.  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  This resulted in evidence of appendicitis with possible perforation.  Therefore empiric Zosyn was given.  I discussed with surgery who recommended medicine admission for continued IV antibiotics for consideration of operation.  I discussed with hospitalist and they will admit the patient to their service.  I updated the family and they are agreeable with plan.  Amount and/or Complexity of Data Reviewed Labs: ordered. Radiology: ordered.  Risk Prescription drug management. Decision regarding hospitalization.      ### All radiography studies, electrocardiograms, and laboratory data were personally reviewed by me and incorporated into my medical decision making. Impression   1. Acute appendicitis with localized peritonitis, unspecified whether abscess present, unspecified whether gangrene present, unspecified whether perforation present      Note: Dragon medical dictation software was used in the creation of this note.     Arminda Landmark, MD 08/30/23 [id number]    Clay Cummins, MD 08/31/23 [phone]

## 2023-08-31 ENCOUNTER — Encounter (HOSPITAL_COMMUNITY): Payer: Self-pay | Admitting: Internal Medicine

## 2023-08-31 ENCOUNTER — Inpatient Hospital Stay (HOSPITAL_COMMUNITY): Admitting: Certified Registered"

## 2023-08-31 ENCOUNTER — Telehealth: Payer: Self-pay | Admitting: Internal Medicine

## 2023-08-31 ENCOUNTER — Other Ambulatory Visit (HOSPITAL_COMMUNITY): Payer: Self-pay

## 2023-08-31 ENCOUNTER — Encounter (HOSPITAL_COMMUNITY)
Admission: EM | Disposition: A | Discharge status: Skilled Nursing Facility | Payer: Self-pay | Source: Home / Self Care | Attending: Internal Medicine

## 2023-08-31 ENCOUNTER — Other Ambulatory Visit: Payer: Self-pay

## 2023-08-31 DIAGNOSIS — K5720 Diverticulitis of large intestine with perforation and abscess without bleeding: Secondary | ICD-10-CM | POA: Diagnosis not present

## 2023-08-31 DIAGNOSIS — D631 Anemia in chronic kidney disease: Secondary | ICD-10-CM | POA: Diagnosis not present

## 2023-08-31 DIAGNOSIS — K3533 Acute appendicitis with perforation and localized peritonitis, with abscess: Secondary | ICD-10-CM | POA: Diagnosis not present

## 2023-08-31 DIAGNOSIS — I509 Heart failure, unspecified: Secondary | ICD-10-CM | POA: Diagnosis not present

## 2023-08-31 DIAGNOSIS — I700 Atherosclerosis of aorta: Secondary | ICD-10-CM | POA: Diagnosis not present

## 2023-08-31 DIAGNOSIS — R140 Abdominal distension (gaseous): Secondary | ICD-10-CM | POA: Diagnosis not present

## 2023-08-31 DIAGNOSIS — R0603 Acute respiratory distress: Secondary | ICD-10-CM | POA: Diagnosis not present

## 2023-08-31 DIAGNOSIS — Z992 Dependence on renal dialysis: Secondary | ICD-10-CM | POA: Diagnosis not present

## 2023-08-31 DIAGNOSIS — R918 Other nonspecific abnormal finding of lung field: Secondary | ICD-10-CM | POA: Diagnosis not present

## 2023-08-31 DIAGNOSIS — Z95828 Presence of other vascular implants and grafts: Secondary | ICD-10-CM | POA: Diagnosis not present

## 2023-08-31 DIAGNOSIS — K37 Unspecified appendicitis: Secondary | ICD-10-CM | POA: Diagnosis present

## 2023-08-31 DIAGNOSIS — N184 Chronic kidney disease, stage 4 (severe): Secondary | ICD-10-CM | POA: Diagnosis not present

## 2023-08-31 DIAGNOSIS — J984 Other disorders of lung: Secondary | ICD-10-CM | POA: Diagnosis not present

## 2023-08-31 DIAGNOSIS — R109 Unspecified abdominal pain: Secondary | ICD-10-CM | POA: Diagnosis not present

## 2023-08-31 DIAGNOSIS — I255 Ischemic cardiomyopathy: Secondary | ICD-10-CM | POA: Diagnosis not present

## 2023-08-31 DIAGNOSIS — M6281 Muscle weakness (generalized): Secondary | ICD-10-CM | POA: Diagnosis not present

## 2023-08-31 DIAGNOSIS — L299 Pruritus, unspecified: Secondary | ICD-10-CM | POA: Diagnosis not present

## 2023-08-31 DIAGNOSIS — K5730 Diverticulosis of large intestine without perforation or abscess without bleeding: Secondary | ICD-10-CM | POA: Diagnosis not present

## 2023-08-31 DIAGNOSIS — D72829 Elevated white blood cell count, unspecified: Secondary | ICD-10-CM | POA: Diagnosis not present

## 2023-08-31 DIAGNOSIS — K388 Other specified diseases of appendix: Secondary | ICD-10-CM | POA: Diagnosis not present

## 2023-08-31 DIAGNOSIS — E785 Hyperlipidemia, unspecified: Secondary | ICD-10-CM | POA: Diagnosis not present

## 2023-08-31 DIAGNOSIS — N2889 Other specified disorders of kidney and ureter: Secondary | ICD-10-CM | POA: Diagnosis not present

## 2023-08-31 DIAGNOSIS — N179 Acute kidney failure, unspecified: Secondary | ICD-10-CM | POA: Diagnosis not present

## 2023-08-31 DIAGNOSIS — D649 Anemia, unspecified: Secondary | ICD-10-CM | POA: Diagnosis not present

## 2023-08-31 DIAGNOSIS — K439 Ventral hernia without obstruction or gangrene: Secondary | ICD-10-CM | POA: Diagnosis not present

## 2023-08-31 DIAGNOSIS — R0600 Dyspnea, unspecified: Secondary | ICD-10-CM | POA: Diagnosis not present

## 2023-08-31 DIAGNOSIS — I25110 Atherosclerotic heart disease of native coronary artery with unstable angina pectoris: Secondary | ICD-10-CM

## 2023-08-31 DIAGNOSIS — N400 Enlarged prostate without lower urinary tract symptoms: Secondary | ICD-10-CM | POA: Diagnosis not present

## 2023-08-31 DIAGNOSIS — I2510 Atherosclerotic heart disease of native coronary artery without angina pectoris: Secondary | ICD-10-CM

## 2023-08-31 DIAGNOSIS — R2689 Other abnormalities of gait and mobility: Secondary | ICD-10-CM | POA: Diagnosis not present

## 2023-08-31 DIAGNOSIS — K35201 Acute appendicitis with generalized peritonitis, with perforation, without abscess: Secondary | ICD-10-CM | POA: Diagnosis not present

## 2023-08-31 DIAGNOSIS — K3580 Unspecified acute appendicitis: Secondary | ICD-10-CM | POA: Diagnosis present

## 2023-08-31 DIAGNOSIS — D7389 Other diseases of spleen: Secondary | ICD-10-CM | POA: Diagnosis not present

## 2023-08-31 DIAGNOSIS — J9811 Atelectasis: Secondary | ICD-10-CM | POA: Diagnosis not present

## 2023-08-31 DIAGNOSIS — Z86711 Personal history of pulmonary embolism: Secondary | ICD-10-CM | POA: Diagnosis not present

## 2023-08-31 DIAGNOSIS — A419 Sepsis, unspecified organism: Secondary | ICD-10-CM | POA: Diagnosis not present

## 2023-08-31 DIAGNOSIS — R1313 Dysphagia, pharyngeal phase: Secondary | ICD-10-CM | POA: Diagnosis not present

## 2023-08-31 DIAGNOSIS — K429 Umbilical hernia without obstruction or gangrene: Secondary | ICD-10-CM | POA: Diagnosis not present

## 2023-08-31 DIAGNOSIS — N281 Cyst of kidney, acquired: Secondary | ICD-10-CM | POA: Diagnosis not present

## 2023-08-31 DIAGNOSIS — K6389 Other specified diseases of intestine: Secondary | ICD-10-CM | POA: Diagnosis not present

## 2023-08-31 DIAGNOSIS — K661 Hemoperitoneum: Secondary | ICD-10-CM | POA: Diagnosis not present

## 2023-08-31 DIAGNOSIS — I10 Essential (primary) hypertension: Secondary | ICD-10-CM | POA: Diagnosis not present

## 2023-08-31 DIAGNOSIS — L0291 Cutaneous abscess, unspecified: Secondary | ICD-10-CM | POA: Diagnosis not present

## 2023-08-31 DIAGNOSIS — J9600 Acute respiratory failure, unspecified whether with hypoxia or hypercapnia: Secondary | ICD-10-CM | POA: Diagnosis not present

## 2023-08-31 DIAGNOSIS — K353 Acute appendicitis with localized peritonitis, without perforation or gangrene: Secondary | ICD-10-CM

## 2023-08-31 DIAGNOSIS — I517 Cardiomegaly: Secondary | ICD-10-CM | POA: Diagnosis not present

## 2023-08-31 DIAGNOSIS — R000 Tachycardia, unspecified: Secondary | ICD-10-CM | POA: Diagnosis not present

## 2023-08-31 DIAGNOSIS — I471 Supraventricular tachycardia, unspecified: Secondary | ICD-10-CM | POA: Diagnosis not present

## 2023-08-31 DIAGNOSIS — Z4901 Encounter for fitting and adjustment of extracorporeal dialysis catheter: Secondary | ICD-10-CM | POA: Diagnosis not present

## 2023-08-31 DIAGNOSIS — M79602 Pain in left arm: Secondary | ICD-10-CM | POA: Diagnosis not present

## 2023-08-31 DIAGNOSIS — K8020 Calculus of gallbladder without cholecystitis without obstruction: Secondary | ICD-10-CM | POA: Diagnosis not present

## 2023-08-31 DIAGNOSIS — T82898A Other specified complication of vascular prosthetic devices, implants and grafts, initial encounter: Secondary | ICD-10-CM | POA: Diagnosis not present

## 2023-08-31 DIAGNOSIS — N186 End stage renal disease: Secondary | ICD-10-CM | POA: Diagnosis not present

## 2023-08-31 DIAGNOSIS — J90 Pleural effusion, not elsewhere classified: Secondary | ICD-10-CM | POA: Diagnosis not present

## 2023-08-31 DIAGNOSIS — Q630 Accessory kidney: Secondary | ICD-10-CM | POA: Diagnosis not present

## 2023-08-31 DIAGNOSIS — K36 Other appendicitis: Secondary | ICD-10-CM | POA: Diagnosis not present

## 2023-08-31 DIAGNOSIS — Z4682 Encounter for fitting and adjustment of non-vascular catheter: Secondary | ICD-10-CM | POA: Diagnosis not present

## 2023-08-31 DIAGNOSIS — E7800 Pure hypercholesterolemia, unspecified: Secondary | ICD-10-CM | POA: Diagnosis not present

## 2023-08-31 DIAGNOSIS — Z01810 Encounter for preprocedural cardiovascular examination: Secondary | ICD-10-CM | POA: Diagnosis not present

## 2023-08-31 DIAGNOSIS — T8141XA Infection following a procedure, superficial incisional surgical site, initial encounter: Secondary | ICD-10-CM | POA: Diagnosis not present

## 2023-08-31 DIAGNOSIS — Z931 Gastrostomy status: Secondary | ICD-10-CM | POA: Diagnosis not present

## 2023-08-31 DIAGNOSIS — I4892 Unspecified atrial flutter: Secondary | ICD-10-CM | POA: Diagnosis not present

## 2023-08-31 DIAGNOSIS — K567 Ileus, unspecified: Secondary | ICD-10-CM | POA: Diagnosis not present

## 2023-08-31 DIAGNOSIS — Z8673 Personal history of transient ischemic attack (TIA), and cerebral infarction without residual deficits: Secondary | ICD-10-CM | POA: Diagnosis not present

## 2023-08-31 DIAGNOSIS — Z743 Need for continuous supervision: Secondary | ICD-10-CM | POA: Diagnosis not present

## 2023-08-31 DIAGNOSIS — Y838 Other surgical procedures as the cause of abnormal reaction of the patient, or of later complication, without mention of misadventure at the time of the procedure: Secondary | ICD-10-CM | POA: Diagnosis not present

## 2023-08-31 DIAGNOSIS — N170 Acute kidney failure with tubular necrosis: Secondary | ICD-10-CM | POA: Diagnosis not present

## 2023-08-31 DIAGNOSIS — N189 Chronic kidney disease, unspecified: Secondary | ICD-10-CM | POA: Diagnosis not present

## 2023-08-31 DIAGNOSIS — E1122 Type 2 diabetes mellitus with diabetic chronic kidney disease: Secondary | ICD-10-CM | POA: Diagnosis not present

## 2023-08-31 DIAGNOSIS — R188 Other ascites: Secondary | ICD-10-CM | POA: Diagnosis not present

## 2023-08-31 DIAGNOSIS — I120 Hypertensive chronic kidney disease with stage 5 chronic kidney disease or end stage renal disease: Secondary | ICD-10-CM | POA: Diagnosis not present

## 2023-08-31 DIAGNOSIS — Z6830 Body mass index (BMI) 30.0-30.9, adult: Secondary | ICD-10-CM | POA: Diagnosis not present

## 2023-08-31 DIAGNOSIS — R0602 Shortness of breath: Secondary | ICD-10-CM | POA: Diagnosis not present

## 2023-08-31 DIAGNOSIS — F03A4 Unspecified dementia, mild, with anxiety: Secondary | ICD-10-CM | POA: Diagnosis not present

## 2023-08-31 DIAGNOSIS — R1031 Right lower quadrant pain: Secondary | ICD-10-CM | POA: Diagnosis present

## 2023-08-31 DIAGNOSIS — L02211 Cutaneous abscess of abdominal wall: Secondary | ICD-10-CM | POA: Diagnosis not present

## 2023-08-31 DIAGNOSIS — K651 Peritoneal abscess: Secondary | ICD-10-CM | POA: Diagnosis not present

## 2023-08-31 DIAGNOSIS — E119 Type 2 diabetes mellitus without complications: Secondary | ICD-10-CM | POA: Diagnosis not present

## 2023-08-31 DIAGNOSIS — I159 Secondary hypertension, unspecified: Secondary | ICD-10-CM | POA: Diagnosis not present

## 2023-08-31 DIAGNOSIS — D62 Acute posthemorrhagic anemia: Secondary | ICD-10-CM | POA: Diagnosis not present

## 2023-08-31 DIAGNOSIS — G35 Multiple sclerosis: Secondary | ICD-10-CM | POA: Diagnosis not present

## 2023-08-31 DIAGNOSIS — F03A0 Unspecified dementia, mild, without behavioral disturbance, psychotic disturbance, mood disturbance, and anxiety: Secondary | ICD-10-CM | POA: Diagnosis not present

## 2023-08-31 DIAGNOSIS — K3532 Acute appendicitis with perforation and localized peritonitis, without abscess: Secondary | ICD-10-CM | POA: Diagnosis not present

## 2023-08-31 DIAGNOSIS — R0989 Other specified symptoms and signs involving the circulatory and respiratory systems: Secondary | ICD-10-CM | POA: Diagnosis not present

## 2023-08-31 DIAGNOSIS — Z452 Encounter for adjustment and management of vascular access device: Secondary | ICD-10-CM | POA: Diagnosis not present

## 2023-08-31 DIAGNOSIS — I130 Hypertensive heart and chronic kidney disease with heart failure and stage 1 through stage 4 chronic kidney disease, or unspecified chronic kidney disease: Secondary | ICD-10-CM | POA: Diagnosis not present

## 2023-08-31 DIAGNOSIS — E43 Unspecified severe protein-calorie malnutrition: Secondary | ICD-10-CM | POA: Diagnosis not present

## 2023-08-31 DIAGNOSIS — I129 Hypertensive chronic kidney disease with stage 1 through stage 4 chronic kidney disease, or unspecified chronic kidney disease: Secondary | ICD-10-CM | POA: Diagnosis not present

## 2023-08-31 DIAGNOSIS — E871 Hypo-osmolality and hyponatremia: Secondary | ICD-10-CM | POA: Diagnosis present

## 2023-08-31 DIAGNOSIS — N185 Chronic kidney disease, stage 5: Secondary | ICD-10-CM | POA: Diagnosis not present

## 2023-08-31 DIAGNOSIS — E872 Acidosis, unspecified: Secondary | ICD-10-CM | POA: Diagnosis not present

## 2023-08-31 DIAGNOSIS — E1169 Type 2 diabetes mellitus with other specified complication: Secondary | ICD-10-CM | POA: Diagnosis not present

## 2023-08-31 DIAGNOSIS — Z7401 Bed confinement status: Secondary | ICD-10-CM | POA: Diagnosis not present

## 2023-08-31 DIAGNOSIS — I472 Ventricular tachycardia, unspecified: Secondary | ICD-10-CM | POA: Diagnosis not present

## 2023-08-31 DIAGNOSIS — Z951 Presence of aortocoronary bypass graft: Secondary | ICD-10-CM | POA: Diagnosis not present

## 2023-08-31 DIAGNOSIS — Z9889 Other specified postprocedural states: Secondary | ICD-10-CM | POA: Diagnosis not present

## 2023-08-31 DIAGNOSIS — I132 Hypertensive heart and chronic kidney disease with heart failure and with stage 5 chronic kidney disease, or end stage renal disease: Secondary | ICD-10-CM | POA: Diagnosis not present

## 2023-08-31 DIAGNOSIS — I480 Paroxysmal atrial fibrillation: Secondary | ICD-10-CM | POA: Diagnosis present

## 2023-08-31 HISTORY — PX: LAPAROSCOPIC APPENDECTOMY: SHX408

## 2023-08-31 LAB — COMPREHENSIVE METABOLIC PANEL WITH GFR
ALT: 19 U/L (ref 0–44)
AST: 21 U/L (ref 15–41)
Albumin: 2.9 g/dL — ABNORMAL LOW (ref 3.5–5.0)
Alkaline Phosphatase: 67 U/L (ref 38–126)
Anion gap: 18 — ABNORMAL HIGH (ref 5–15)
BUN: 90 mg/dL — ABNORMAL HIGH (ref 8–23)
CO2: 25 mmol/L (ref 22–32)
Calcium: 9.3 mg/dL (ref 8.9–10.3)
Chloride: 98 mmol/L (ref 98–111)
Creatinine, Ser: 3.88 mg/dL — ABNORMAL HIGH (ref 0.61–1.24)
GFR, Estimated: 15 mL/min — ABNORMAL LOW (ref >60)
Glucose, Bld: 79 mg/dL (ref 70–99)
Potassium: 3 mmol/L — ABNORMAL LOW (ref 3.5–5.1)
Sodium: 141 mmol/L (ref 135–145)
Total Bilirubin: 0.9 mg/dL (ref 0.0–1.2)
Total Protein: 7.1 g/dL (ref 6.5–8.1)

## 2023-08-31 LAB — GLUCOSE, CAPILLARY
Glucose-Capillary: 103 mg/dL — ABNORMAL HIGH (ref 70–99)
Glucose-Capillary: 98 mg/dL (ref 70–99)
Glucose-Capillary: 88 mg/dL (ref 70–99)
Glucose-Capillary: 113 mg/dL — ABNORMAL HIGH (ref 70–99)
Glucose-Capillary: 176 mg/dL — ABNORMAL HIGH (ref 70–99)

## 2023-08-31 LAB — CBC WITH DIFFERENTIAL/PLATELET
Abs Immature Granulocytes: 0.13 10*3/uL — ABNORMAL HIGH (ref 0.00–0.07)
Basophils Absolute: 0 10*3/uL (ref 0.0–0.1)
Basophils Relative: 0 %
Eosinophils Absolute: 0 10*3/uL (ref 0.0–0.5)
Eosinophils Relative: 0 %
HCT: 43.1 % (ref 39.0–52.0)
Hemoglobin: 14.2 g/dL (ref 13.0–17.0)
Immature Granulocytes: 1 %
Lymphocytes Relative: 6 %
Lymphs Abs: 1.1 10*3/uL (ref 0.7–4.0)
MCH: 34.4 pg — ABNORMAL HIGH (ref 26.0–34.0)
MCHC: 32.9 g/dL (ref 30.0–36.0)
MCV: 104.4 fL — ABNORMAL HIGH (ref 80.0–100.0)
Monocytes Absolute: 1.1 10*3/uL — ABNORMAL HIGH (ref 0.1–1.0)
Monocytes Relative: 7 %
Neutro Abs: 14.6 10*3/uL — ABNORMAL HIGH (ref 1.7–7.7)
Neutrophils Relative %: 86 %
Platelets: 209 10*3/uL (ref 150–400)
RBC: 4.13 MIL/uL — ABNORMAL LOW (ref 4.22–5.81)
RDW: 14.3 % (ref 11.5–15.5)
WBC: 17 10*3/uL — ABNORMAL HIGH (ref 4.0–10.5)
nRBC: 0 % (ref 0.0–0.2)

## 2023-08-31 LAB — CBG MONITORING, ED: Glucose-Capillary: 73 mg/dL (ref 70–99)

## 2023-08-31 SURGERY — APPENDECTOMY, LAPAROSCOPIC
Anesthesia: General | Site: Abdomen

## 2023-08-31 MED ORDER — PHENYLEPHRINE HCL-NACL 20-0.9 MG/250ML-% IV SOLN
INTRAVENOUS | Status: DC | PRN
Start: 2023-08-31 — End: 2023-08-31
  Administered 2023-08-31: 50 ug/min via INTRAVENOUS

## 2023-08-31 MED ORDER — ALBUTEROL SULFATE (2.5 MG/3ML) 0.083% IN NEBU: 2.5000 mg | INHALATION_SOLUTION | RESPIRATORY_TRACT | Status: DC | PRN

## 2023-08-31 MED ORDER — CHLORHEXIDINE GLUCONATE 0.12 % MT SOLN
15.0000 mL | Freq: Once | OROMUCOSAL | Status: AC
  Administered 2023-08-31: 15 mL via OROMUCOSAL
  Filled 2023-08-31: qty 15

## 2023-08-31 MED ORDER — SUGAMMADEX SODIUM 200 MG/2ML IV SOLN
INTRAVENOUS | Status: DC | PRN
  Administered 2023-08-31: 50 mg via INTRAVENOUS
  Administered 2023-08-31: 200 mg via INTRAVENOUS

## 2023-08-31 MED ORDER — OXYCODONE HCL 5 MG/5ML PO SOLN: 5.0000 mg | Freq: Once | ORAL | Status: DC | PRN

## 2023-08-31 MED ORDER — FLUTICASONE FUROATE-VILANTEROL 200-25 MCG/ACT IN AEPB
1.0000 | INHALATION_SPRAY | Freq: Every day | RESPIRATORY_TRACT | Status: DC
  Administered 2023-09-02 – 2023-10-01 (×21): 1 via RESPIRATORY_TRACT
  Filled 2023-08-31 (×2): qty 28

## 2023-08-31 MED ORDER — TRAMADOL HCL 50 MG PO TABS
50.0000 mg | ORAL_TABLET | Freq: Two times a day (BID) | ORAL | Status: DC | PRN
  Administered 2023-09-01: 50 mg via ORAL
  Filled 2023-08-31: qty 1

## 2023-08-31 MED ORDER — LIDOCAINE 2% (20 MG/ML) 5 ML SYRINGE
INTRAMUSCULAR | Status: AC
  Filled 2023-08-31: qty 5

## 2023-08-31 MED ORDER — OXYCODONE HCL 5 MG PO TABS: 5.0000 mg | ORAL_TABLET | Freq: Once | ORAL | Status: DC | PRN

## 2023-08-31 MED ORDER — METOPROLOL TARTRATE 5 MG/5ML IV SOLN
2.5000 mg | Freq: Four times a day (QID) | INTRAVENOUS | Status: DC
  Administered 2023-08-31 – 2023-09-01 (×6): 2.5 mg via INTRAVENOUS
  Filled 2023-08-31 (×6): qty 5

## 2023-08-31 MED ORDER — ACETAMINOPHEN 500 MG PO TABS
1000.0000 mg | ORAL_TABLET | Freq: Four times a day (QID) | ORAL | Status: DC
  Administered 2023-08-31 – 2023-09-05 (×15): 1000 mg via ORAL
  Filled 2023-08-31 (×17): qty 2

## 2023-08-31 MED ORDER — FENTANYL CITRATE (PF) 100 MCG/2ML IJ SOLN: 25.0000 ug | INTRAMUSCULAR | Status: DC | PRN

## 2023-08-31 MED ORDER — IBUPROFEN 600 MG PO TABS
600.0000 mg | ORAL_TABLET | Freq: Four times a day (QID) | ORAL | 1 refills | Status: DC
  Filled 2023-08-31: qty 120, 30d supply, fill #0

## 2023-08-31 MED ORDER — FENTANYL CITRATE (PF) 250 MCG/5ML IJ SOLN
INTRAMUSCULAR | Status: AC
  Filled 2023-08-31: qty 5

## 2023-08-31 MED ORDER — LORAZEPAM 2 MG/ML IJ SOLN
0.5000 mg | INTRAMUSCULAR | Status: DC | PRN
  Administered 2023-09-01 – 2023-09-20 (×6): 0.5 mg via INTRAVENOUS
  Filled 2023-08-31 (×7): qty 1

## 2023-08-31 MED ORDER — PIPERACILLIN-TAZOBACTAM IN DEX 2-0.25 GM/50ML IV SOLN
2.2500 g | Freq: Three times a day (TID) | INTRAVENOUS | Status: DC
  Administered 2023-08-31 – 2023-09-13 (×35): 2.25 g via INTRAVENOUS
  Filled 2023-08-31 (×41): qty 50

## 2023-08-31 MED ORDER — BUPIVACAINE HCL (PF) 0.25 % IJ SOLN
INTRAMUSCULAR | Status: DC | PRN
  Administered 2023-08-31: 20 mL

## 2023-08-31 MED ORDER — FENTANYL CITRATE (PF) 250 MCG/5ML IJ SOLN
INTRAMUSCULAR | Status: DC | PRN
  Administered 2023-08-31 (×2): 25 ug via INTRAVENOUS
  Administered 2023-08-31: 100 ug via INTRAVENOUS

## 2023-08-31 MED ORDER — FENTANYL CITRATE PF 50 MCG/ML IJ SOSY
25.0000 ug | PREFILLED_SYRINGE | INTRAMUSCULAR | Status: AC | PRN
  Administered 2023-08-31 – 2023-09-01 (×4): 25 ug via INTRAVENOUS
  Filled 2023-08-31 (×4): qty 1

## 2023-08-31 MED ORDER — ONDANSETRON HCL 4 MG/2ML IJ SOLN
4.0000 mg | Freq: Four times a day (QID) | INTRAMUSCULAR | Status: DC | PRN
  Administered 2023-09-01 – 2023-09-05 (×4): 4 mg via INTRAVENOUS
  Filled 2023-08-31 (×5): qty 2

## 2023-08-31 MED ORDER — PHENYLEPHRINE HCL (PRESSORS) 10 MG/ML IV SOLN
INTRAVENOUS | Status: DC | PRN
  Administered 2023-08-31 (×3): 160 ug via INTRAVENOUS

## 2023-08-31 MED ORDER — BUPIVACAINE LIPOSOME 1.3 % IJ SUSP
INTRAMUSCULAR | Status: AC
  Filled 2023-08-31: qty 20

## 2023-08-31 MED ORDER — POTASSIUM CHLORIDE 10 MEQ/100ML IV SOLN
10.0000 meq | Freq: Once | INTRAVENOUS | Status: AC
  Administered 2023-08-31: 10 meq via INTRAVENOUS
  Filled 2023-08-31: qty 100

## 2023-08-31 MED ORDER — HYDRALAZINE HCL 20 MG/ML IJ SOLN: 10.0000 mg | INTRAMUSCULAR | Status: DC | PRN

## 2023-08-31 MED ORDER — BUPIVACAINE LIPOSOME 1.3 % IJ SUSP
INTRAMUSCULAR | Status: DC | PRN
  Administered 2023-08-31: 20 mL

## 2023-08-31 MED ORDER — VASOPRESSIN 20 UNIT/ML IV SOLN
INTRAVENOUS | Status: AC
  Filled 2023-08-31: qty 1

## 2023-08-31 MED ORDER — PROPOFOL 10 MG/ML IV BOLUS
INTRAVENOUS | Status: DC | PRN
  Administered 2023-08-31: 30 mg via INTRAVENOUS
  Administered 2023-08-31: 60 mg via INTRAVENOUS

## 2023-08-31 MED ORDER — ONDANSETRON HCL 4 MG/2ML IJ SOLN
INTRAMUSCULAR | Status: DC | PRN
  Administered 2023-08-31: 4 mg via INTRAVENOUS

## 2023-08-31 MED ORDER — ENOXAPARIN SODIUM 30 MG/0.3ML IJ SOSY: 30.0000 mg | PREFILLED_SYRINGE | INTRAMUSCULAR | Status: DC

## 2023-08-31 MED ORDER — LACTATED RINGERS IV SOLN: INTRAVENOUS | Status: DC

## 2023-08-31 MED ORDER — ACETAMINOPHEN 10 MG/ML IV SOLN: 1000.0000 mg | Freq: Once | INTRAVENOUS | Status: DC | PRN

## 2023-08-31 MED ORDER — PIPERACILLIN-TAZOBACTAM 3.375 G IVPB
3.3750 g | Freq: Three times a day (TID) | INTRAVENOUS | Status: DC
  Administered 2023-08-31 (×2): 3.375 g via INTRAVENOUS
  Filled 2023-08-31 (×3): qty 50

## 2023-08-31 MED ORDER — DOCUSATE SODIUM 100 MG PO CAPS
100.0000 mg | ORAL_CAPSULE | Freq: Two times a day (BID) | ORAL | 2 refills | Status: DC
  Filled 2023-08-31: qty 60, 30d supply, fill #0

## 2023-08-31 MED ORDER — ROCURONIUM BROMIDE 10 MG/ML (PF) SYRINGE
PREFILLED_SYRINGE | INTRAVENOUS | Status: AC
  Filled 2023-08-31: qty 10

## 2023-08-31 MED ORDER — INSULIN ASPART 100 UNIT/ML IJ SOLN
0.0000 [IU] | INTRAMUSCULAR | Status: DC
  Administered 2023-09-01: 2 [IU] via SUBCUTANEOUS
  Administered 2023-09-01 (×2): 1 [IU] via SUBCUTANEOUS
  Administered 2023-09-01 (×3): 2 [IU] via SUBCUTANEOUS
  Administered 2023-09-02: 1 [IU] via SUBCUTANEOUS

## 2023-08-31 MED ORDER — VASOPRESSIN 20 UNIT/ML IV SOLN
INTRAVENOUS | Status: DC | PRN
  Administered 2023-08-31 (×2): 1 [IU] via INTRAVENOUS

## 2023-08-31 MED ORDER — ROCURONIUM BROMIDE 100 MG/10ML IV SOLN
INTRAVENOUS | Status: DC | PRN
Start: 2023-08-31 — End: 2023-08-31
  Administered 2023-08-31: 80 mg via INTRAVENOUS

## 2023-08-31 MED ORDER — ACETAMINOPHEN 500 MG PO TABS
1000.0000 mg | ORAL_TABLET | Freq: Four times a day (QID) | ORAL | 3 refills | Status: DC
  Filled 2023-08-31: qty 120, 15d supply, fill #0

## 2023-08-31 MED ORDER — DEXAMETHASONE SODIUM PHOSPHATE 10 MG/ML IJ SOLN
INTRAMUSCULAR | Status: DC | PRN
  Administered 2023-08-31: 5 mg via INTRAVENOUS

## 2023-08-31 MED ORDER — OXYCODONE HCL 5 MG PO TABS
5.0000 mg | ORAL_TABLET | ORAL | 0 refills | Status: DC | PRN
  Filled 2023-08-31: qty 15, 3d supply, fill #0

## 2023-08-31 MED ORDER — METHOCARBAMOL 750 MG PO TABS
750.0000 mg | ORAL_TABLET | Freq: Four times a day (QID) | ORAL | 1 refills | Status: DC
  Filled 2023-08-31: qty 120, 30d supply, fill #0

## 2023-08-31 MED ORDER — PROPOFOL 10 MG/ML IV BOLUS
INTRAVENOUS | Status: AC
  Filled 2023-08-31: qty 20

## 2023-08-31 MED ORDER — LIDOCAINE HCL (CARDIAC) PF 100 MG/5ML IV SOSY
PREFILLED_SYRINGE | INTRAVENOUS | Status: DC | PRN
  Administered 2023-08-31: 60 mg via INTRATRACHEAL

## 2023-08-31 MED ORDER — 0.9 % SODIUM CHLORIDE (POUR BTL) OPTIME
TOPICAL | Status: DC | PRN
  Administered 2023-08-31: 1000 mL

## 2023-08-31 MED ORDER — METHOCARBAMOL 500 MG PO TABS
500.0000 mg | ORAL_TABLET | Freq: Three times a day (TID) | ORAL | Status: DC | PRN
  Administered 2023-08-31: 500 mg via ORAL
  Filled 2023-08-31: qty 1

## 2023-08-31 MED ORDER — ORAL CARE MOUTH RINSE
15.0000 mL | Freq: Once | OROMUCOSAL | Status: AC
Start: 2023-08-31 — End: 2023-08-31

## 2023-08-31 MED ORDER — ALBUMIN HUMAN 5 % IV SOLN: INTRAVENOUS | Status: DC | PRN

## 2023-08-31 MED ORDER — BUPIVACAINE HCL (PF) 0.25 % IJ SOLN
INTRAMUSCULAR | Status: AC
  Filled 2023-08-31: qty 30

## 2023-08-31 SURGICAL SUPPLY — 37 items
BLADE CLIPPER SURG (BLADE) IMPLANT
CANISTER SUCT 3000ML PPV (MISCELLANEOUS) IMPLANT
CHLORAPREP W/TINT 26 (MISCELLANEOUS) ×1 IMPLANT
COVER SURGICAL LIGHT HANDLE (MISCELLANEOUS) ×1 IMPLANT
CUTTER FLEX LINEAR 45M (STAPLE) ×1 IMPLANT
DERMABOND ADVANCED .7 DNX12 (GAUZE/BANDAGES/DRESSINGS) IMPLANT
DRAIN CHANNEL 19F RND (DRAIN) IMPLANT
ELECT CAUTERY BLADE 6.4 (BLADE) ×1 IMPLANT
ELECTRODE REM PT RTRN 9FT ADLT (ELECTROSURGICAL) ×1 IMPLANT
EVACUATOR SILICONE 100CC (DRAIN) IMPLANT
GLOVE BIO SURGEON STRL SZ 6.5 (GLOVE) ×1 IMPLANT
GLOVE BIOGEL PI IND STRL 6 (GLOVE) ×1 IMPLANT
GOWN STRL REUS W/ TWL LRG LVL3 (GOWN DISPOSABLE) ×3 IMPLANT
IRRIGATION SUCT STRKRFLW 2 WTP (MISCELLANEOUS) IMPLANT
KIT BASIN OR (CUSTOM PROCEDURE TRAY) ×1 IMPLANT
KIT TURNOVER KIT B (KITS) ×1 IMPLANT
NS IRRIG 1000ML POUR BTL (IV SOLUTION) ×1 IMPLANT
PAD ARMBOARD POSITIONER FOAM (MISCELLANEOUS) ×2 IMPLANT
PENCIL BUTTON HOLSTER BLD 10FT (ELECTRODE) ×1 IMPLANT
RELOAD 45 VASCULAR/THIN (ENDOMECHANICALS) ×1 IMPLANT
RELOAD STAPLE 45 2.5 WHT GRN (ENDOMECHANICALS) ×1 IMPLANT
RELOAD STAPLE 45 3.5 BLU ETS (ENDOMECHANICALS) ×1 IMPLANT
RELOAD STAPLE TA45 3.5 REG BLU (ENDOMECHANICALS) ×1 IMPLANT
SET TUBE SMOKE EVAC HIGH FLOW (TUBING) ×1 IMPLANT
SHEARS HARMONIC ACE PLUS 36CM (ENDOMECHANICALS) IMPLANT
SLEEVE Z-THREAD 5X100MM (TROCAR) ×1 IMPLANT
SPECIMEN JAR SMALL (MISCELLANEOUS) ×1 IMPLANT
SUT ETHILON 2 0 FS 18 (SUTURE) IMPLANT
SUT MNCRL AB 4-0 PS2 18 (SUTURE) ×1 IMPLANT
SUT VICRYL 0 UR6 27IN ABS (SUTURE) IMPLANT
SYSTEM BAG RETRIEVAL 10MM (BASKET) ×1 IMPLANT
TOWEL GREEN STERILE FF (TOWEL DISPOSABLE) ×1 IMPLANT
TRAY LAPAROSCOPIC MC (CUSTOM PROCEDURE TRAY) ×1 IMPLANT
TROCAR BALLN 12MMX100 BLUNT (TROCAR) IMPLANT
TROCAR Z-THREAD OPTICAL 5X100M (TROCAR) ×1 IMPLANT
WARMER LAPAROSCOPE (MISCELLANEOUS) ×1 IMPLANT
WATER STERILE IRR 1000ML POUR (IV SOLUTION) ×1 IMPLANT

## 2023-08-31 NOTE — Op Note (Signed)
   Operative Note   Date: 08/31/2023  Procedure: laparoscopic appendectomy  Pre-op diagnosis: acute appendicitis, umbilical hernia Post-op diagnosis: Grade 2 appendicitis: perforated appendix  Indication and clinical history: The patient is a 79 y.o. year old male with perforated appendicitis     Surgeon: Anda Bamberg, MD  Anesthesiologist: Brain Cahill, MD Anesthesia: General  Findings:  Specimen: appendix EBL: <5cc Drains/Implants: 66F JP, LLQ, terminating in the appendiceal fossa  Disposition: PACU - hemodynamically stable.  Description of procedure: The patient was positioned supine on the operating room table. Time-out was performed verifying correct patient, procedure, signature of informed consent, and administration of pre-operative antibiotics. General anesthetic induction and intubation were uneventful. Foley catheter insertion was performed and was atraumatic . The abdomen was prepped and draped in the usual sterile fashion. An umbilical incision was made through the existing umbilical hernia using an open technique using zero vicryl stay sutures on either side of the fascia and a 10mm Hassan port inserted. After establishing pneumoperitoneum, which the patient tolerated well, the abdominal cavity was inspected and no injury of any intra-abdominal structures was identified. Two additional five millimeter ports were placed under direct visualization and using local anesthetic in the suprapubic and left lower quadrant regions. The patient was repositioned to Trendelenburg with the left side down. Further inspection of the right lower quadrant revealed purulent fluid and grade 2 appendicitis. Adhesiolysis was performed for approximately 15 minutes during dissection, mobilization, and identification of the appendix. The appendix was dissected away from its mesoappendix and an endoscopic stapler used to divide the mesoappendix using a vascular load. A bowel load of the endoscopic stapler  was used to staple across the appendix at its base. Both staple lines were inspected and found to be intact and without bleeding. The appendix was placed in an endoscopic specimen retrieval bag, removed via the umbilical port site, and sent to pathology as a permanent specimen. The right lower quadrant was again inspected and hemostasis confirmed. Any remaining fluid identified was suctioned. A drain was placed via the LLQ port site and terminated in the appendiceal fossa. The suprapubic and left lower quadrant ports were removed under direct visualization and hemostasis confirmed. The umbilical port was removed last after desufflating the abdomen and the fascia re-approximated using the stay sutures. Additional local anesthetic was administered at the umbilical incision site. The skin of all port sites was closed with 4-0 monocryl. Sterile dressings were applied. All sponge and instrument counts were correct at the conclusion of the procedure. The patient was awakened from anesthesia, extubated uneventfully, and transported to the PACU in good condition. There were no complications.   Upon entering the abdomen (organ space), I encountered infection of the appendix .  CASE DATA:  Type of patient?: DOW CASE (Surgical Hospitalist [facility])  Status of Case? URGENT Add On  Infection Present At Time Of Surgery (PATOS)?  INFECTION of the appendix   Anda Bamberg, MD General and Trauma Surgery [facility] Surgery

## 2023-08-31 NOTE — Progress Notes (Signed)
 PHARMACY NOTE:  ANTIMICROBIAL RENAL DOSAGE ADJUSTMENT  Current antimicrobial regimen includes a mismatch between antimicrobial dosage and estimated renal function.  As per policy approved by the Pharmacy & Therapeutics and Medical Executive Committees, the antimicrobial dosage will be adjusted accordingly.  Current antimicrobial dosage:  Zosyn EID 3.375gm IV Q8H  Indication: possible periappendiceal abscess  Renal Function:  Estimated Creatinine Clearance: 19 mL/min (A) (by C-G formula based on SCr of 3.88 mg/dL (H)). []      On intermittent HD, scheduled: []      On CRRT    Antimicrobial dosage has been changed to:  Zosyn 2.25gm IV Q8H   Mialynn Shelvin D. Marikay Show, PharmD, BCPS, BCCCP 08/31/2023, 7:52 PM

## 2023-08-31 NOTE — Transfer of Care (Signed)
 Immediate Anesthesia Transfer of Care Note  Patient: Christian [name]  Procedure(s) Performed: APPENDECTOMY, LAPAROSCOPIC (Abdomen)  Patient Location: PACU  Anesthesia Type:General  Level of Consciousness: awake  Airway & Oxygen Therapy: Patient Spontanous Breathing and Patient connected to face mask oxygen  Post-op Assessment: Report given to RN and Post -op Vital signs reviewed and stable  Post vital signs: Reviewed and stable  Last Vitals:  Vitals Value Taken Time  BP 151/37 08/31/23 1620  Temp 36.8 C 08/31/23 1620  Pulse 94 08/31/23 1626  Resp 17 08/31/23 1626  SpO2 94 % 08/31/23 1626  Vitals shown include unfiled device data.  Last Pain:  Vitals:   08/31/23 1620  TempSrc:   PainSc: Asleep         Complications: No notable events documented.

## 2023-08-31 NOTE — Consult Note (Addendum)
 Cardiology Consultation   Patient ID: [name] MRN: [id number]; DOB: 09-12-1945  Admit date: 08/30/2023 Date of Consult: 08/31/2023  PCP:  [name], MD   [facility] Providers Cardiologist:  Knox Perl, MD     Patient Profile:   Christian [name] is a 78 y.o. 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 Joan Mouton  History of Present Illness:   Christian [name] is a 78 year old male with above medical history who has been followed by Dr. Berry Bristol. Patient previously underwent stenting to the PDA in 02/2014. Later had NSTEMI on 08/09/19. Echocardiogram 08/10/19 showed EF 30-35%, grade II DD, normal RV systolic function, normal PA systolic pressure. LHC on 08/10/19 showed 95% ostial LAD stenosis, 30% ostial/proximal ramus stenosis, 75% ostial LCX stenosis, diffuse 40% calcific disease in the proximal-mid RCA, 70% instent restenosis in the distal RCA. Ultimately underwent CABG x4 with LIMA-LAD, RIMA-PDA, radial artery-circ, SVG-OM1. After his CABG, he had several postoperative complications including cardiogenic shock, cardiac arrest requiring internal CPR in the ICU after opening the chest, atrial fibrillation requiring cardioversion, AKI needing dialysis, right brain infarct with left hemiparesis, multiple blood transfusions.   Later, echocardiogram in 06/2020 showed EF 67%, mild concentric LVH. Lexiscan  in 12/2020 showed normal myocardial perfusion and was overall a low risk study.   Most recent echocardiogram from 04/2021 showed EF 50-55%, abnormal septal wall motion due to post-operative CABG, grade I DD. Nuclear stress test from 12/2021 showed normal perfusion without obvious evidence of reversible myocardial ischemia or prior infarct.   Patient was last seen by cardiology on 03/22/23. At that time, patient had some soreness in his neck which was attributed to arthritis. He  reported one episode of right sided chest pain that had not recurred.   Patient presented to the ED on 5/5 complaining of lower abdominal pain. Pain was most prominent in the periumbilical area and right lower quadrant. He had rebound tenderness.  CT abdomen pelvis showed acute appendicitis with possible perforation. He was admitted to the internal medicine service and started on IV antibiotics. General surgery was consulted. Cardiology consulted for preop evaluation given his coronary artery disease history   On interview, patient reports that he has been stable from a cardiac perspective. He has not had any symptoms similar to what he had prior to CABG in the past. He occasionally has brief episodes of right sided chest pain. This occurs randomly and lasts a few minutes before resolving on its own. Symptoms are not associated with exertion. He is able to grocery shop, walk up and down stairs, and do housework without having chest pain/discomfort. He occasional notices that he is breathing harder than usual. If this occurs, he focuses on his breathing and his symptoms usually resolve. Denies orthopnea, lower extremity swelling   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   Diabetic polyneuropathy    Elevated liver enzymes 08/09/2019   Hiatal hernia    History of coronary artery stent placement    History of pulmonary embolism 08/09/2019   HTN (hypertension) 11/12/2009   Hyperlipidemia associated  with type 2 diabetes mellitus 11/12/2009   Left leg weakness 11/23/2018   Mild dementia, 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 LIMA TO LAD, RIMA TO PDA, RADIAL ARTERY TO CIRC AND SEQUENTIALLY TO OM1.   Sleep difficulties 05/30/2020   Type II diabetes mellitus 10/10/2014    Past Surgical History:  Procedure Laterality Date   A/V FISTULAGRAM Left 08/12/2022   Procedure: A/V Fistulagram;  Surgeon: Kayla Part, MD;  Location: [facility] INVASIVE CV LAB;  Service: Cardiovascular;  Laterality: Left;   APPLICATION OF WOUND VAC N/A 08/24/2019   Procedure: APPLICATION OF WOUND VAC;  Surgeon: Rudine Cos, MD;  Location: [facility] OR;  Service: Thoracic;  Laterality: N/A;   APPLICATION OF WOUND VAC  08/29/2019   Procedure: Wound Vac change;  Surgeon: Rudine Cos, MD;  Location: [facility] OR;  Service: Open Heart Surgery;;   APPLICATION OF WOUND VAC N/A 09/04/2019   Procedure: Application Of Wound Vac;  Surgeon: Norita Beauvais, MD;  Location: [facility] OR;  Service: Open Heart Surgery;  Laterality: N/A;   APPLICATION OF WOUND VAC N/A 09/06/2019   Procedure: APPLICATION OF ACELL, APPLICATION OF WOUND VAC USING PREVENA INCISIONAL  DRESSING;  Surgeon: Thornell Flirt, DO;  Location: [facility] OR;  Service: Plastics;  Laterality: N/A;   AV FISTULA PLACEMENT Left 09/18/2020   Procedure: ARTERIOVENOUS (AV) FISTULA CREATION LEFT VERSUS GRAFT;  Surgeon: Margherita Shell, MD;  Location: [facility] OR;  Service: Vascular;  Laterality: Left;   BASCILIC VEIN TRANSPOSITION Left 10/31/2020   Procedure: LEFT SECOND STAGE BASCILIC VEIN TRANSPOSITION;  Surgeon: Margherita Shell, MD;  Location: [facility] OR;  Service: Vascular;  Laterality: Left;   CARDIAC CATHETERIZATION     CORONARY ARTERY BYPASS GRAFT N/A 08/15/2019   Procedure: CORONARY ARTERY BYPASS GRAFTING (CABG), x 4 using bilateral IMAs and left radial artery .  LIMA TO LAD, RIMA TO PDA, RADIAL ARTERY TO CIRC AND SEQUENTIALLY TO OM1.;  Surgeon: Rudine Cos, MD;  Location: [facility] OR;  Service: Open Heart Surgery;  Laterality: N/A;   CORONARY  STENT PLACEMENT  02/27/2014   distal rt/pd coronary       dr Berry Bristol   CYSTO/ BLADDER BIOPSY'S/ CAUTHERIZATION  01-14-2004  DR Isla Mari   EXPLORATION POST OPERATIVE OPEN HEART N/A 08/16/2019   Procedure: Chest Closure S?P CABG WITH APPLICATION OF PREVENA  INCISIONAL WOUND VAC;  Surgeon: Rudine Cos, MD;  Location: [facility] OR;  Service: Open Heart Surgery;  Laterality: N/A;   EXPLORATION POST OPERATIVE OPEN HEART N/A 08/21/2019   Procedure: CHEST WASHOUT S/P OPEN CHEST;  Surgeon: Rudine Cos, MD;  Location: [facility] OR;  Service: Open Heart Surgery;  Laterality: N/A;  Open chest with Esmark dressing with Ioban sealant coverage.   EXPLORATION POST OPERATIVE OPEN HEART N/A 08/18/2019   Procedure: EXPLORATION POST OPERATIVE OPEN HEART (performed 04/23 on unit);  Surgeon: Rudine Cos, MD;  Location: [facility] OR;  Service: Open Heart Surgery;  Laterality: N/A;   EXPLORATION POST OPERATIVE OPEN HEART N/A 08/24/2019   Procedure: CHEST WASHOUT POST OPERATIVE OPEN HEART;  Surgeon: Rudine Cos, MD;  Location: [facility];  Service: Open Heart Surgery;  Laterality: N/A;   EXPLORATION POST OPERATIVE OPEN HEART N/A 08/29/2019   Procedure: CHEST WOUND WASHOUT POST OPERATIVE OPEN HEART;  Surgeon: Rudine Cos, MD;  Location: [facility] OR;  Service: Open Heart Surgery;  Laterality: N/A;   EXPLORATION POST OPERATIVE OPEN HEART N/A 09/04/2019   Procedure: MEDIASTINAL EXPLORATION WITH STERNAL WOUND IRRIGATION;  Surgeon: Norita Beauvais, MD;  Location: [facility] OR;  Service: Open Heart Surgery;  Laterality: N/A;   EXPLORATION POST OPERATIVE OPEN HEART N/A 09/14/2019   Procedure: EVACUATION OF HEMATOMA;  Surgeon: Norita Beauvais, MD;  Location: [facility] OR;  Service: Open Heart Surgery;  Laterality: N/A;   IR FLUORO GUIDE CV LINE LEFT  09/19/2019   IR GASTROSTOMY TUBE MOD SED  10/04/2019   IR GASTROSTOMY TUBE REMOVAL  11/29/2019   IR PATIENT EVAL TECH 0-60 MINS  09/29/2019   IR US  GUIDE VASC ACCESS LEFT  09/19/2019   LAPAROSCOPIC LYSIS OF  ADHESIONS N/A 09/06/2019   Procedure: LAPAROSCOPIC OMENTAL HARVEST;  Surgeon: Derral Flick, MD;  Location: [facility] OR;  Service: General;  Laterality: N/A;   LEFT HEART CATH AND CORONARY ANGIOGRAPHY N/A 08/10/2019   Procedure: LEFT HEART CATH AND CORONARY ANGIOGRAPHY;  Surgeon: Cody Das, MD;  Location: [facility] INVASIVE CV LAB;  Service: Cardiovascular;  Laterality: N/A;   LEFT HEART CATHETERIZATION WITH CORONARY ANGIOGRAM N/A 02/27/2014   Procedure: LEFT HEART CATHETERIZATION WITH CORONARY ANGIOGRAM;  Surgeon: Jessica Morn, MD;  Location: [facility] CATH LAB;  Service: Cardiovascular;  Laterality: N/A;   MEDIASTINAL EXPLORATION N/A 09/06/2019   Procedure: MEDIASTINAL EXPLORATION;  Surgeon: Norita Beauvais, MD;  Location: [facility] OR;  Service: Thoracic;  Laterality: N/A;   PECTORALIS FLAP  09/06/2019   Procedure: Pectoralis ADVANCEMENT Flap;  Surgeon: Thornell Flirt, DO;  Location: [facility] OR;  Service: Plastics;;   PERCUTANEOUS CORONARY STENT INTERVENTION (PCI-S)  02/27/2014   Procedure: PERCUTANEOUS CORONARY STENT INTERVENTION (PCI-S);  Surgeon: Jessica Morn, MD;  Location: [facility] CATH LAB;  Service: Cardiovascular;;  rt PDA  3.0/28mm Promus stent   PERIPHERAL VASCULAR INTERVENTION Left 08/12/2022   Procedure: PERIPHERAL VASCULAR INTERVENTION;  Surgeon: Kayla Part, MD;  Location: [facility] INVASIVE CV LAB;  Service: Cardiovascular;  Laterality: Left;   RADIAL ARTERY HARVEST Left 08/15/2019   Procedure: Radial Artery Harvest;  Surgeon: Rudine Cos, MD;  Location: [facility] OR;  Service: Open Heart Surgery;  Laterality: Left;   RIB PLATING N/A 09/06/2019   Procedure: STERNAL PLATING;  Surgeon: Norita Beauvais, MD;  Location: [facility] OR;  Service: Thoracic;  Laterality: N/A;   TEE WITHOUT CARDIOVERSION N/A 08/15/2019   Procedure: TRANSESOPHAGEAL ECHOCARDIOGRAM (TEE);  Surgeon: Rudine Cos, MD;  Location: [facility] OR;  Service: Open Heart Surgery;  Laterality: N/A;   TRACHEOSTOMY TUBE PLACEMENT  08/29/2019    Procedure: Tracheostomy;  Surgeon: Rudine Cos, MD;  Location: [facility] OR;  Service: Open Heart Surgery; Decannulated 6/10/20211   TRANSURETHRAL RESECTION OF PROSTATE  04/04/2012   Procedure: TRANSURETHRAL RESECTION OF THE PROSTATE WITH GYRUS INSTRUMENTS;  Surgeon: Edmund Gouge, MD;  Location: [facility];  Service: Urology;  Laterality: N/A;   TRANSURETHRAL RESECTION OF PROSTATE N/A 09/27/2014   Procedure: TRANSURETHRAL RESECTION OF THE PROSTATE ;  Surgeon: Annamarie Kid, MD;  Location: [facility] ORS;  Service: Urology;  Laterality: N/A;   UPPER GASTROINTESTINAL ENDOSCOPY       Inpatient Medications: Scheduled Meds:  fluticasone  furoate-vilanterol  1 puff Inhalation Daily   insulin  aspart  0-6 Units Subcutaneous Q4H   metoprolol  tartrate  2.5 mg Intravenous Q6H   Continuous Infusions:  piperacillin-tazobactam (ZOSYN)  IV Stopped (08/31/23 0653)   PRN Meds: albuterol , fentaNYL  (SUBLIMAZE ) injection,  hydrALAZINE , LORazepam, ondansetron  (ZOFRAN ) IV  Allergies:   No Known Allergies  Social History:   Social History   Socioeconomic History   Marital status: Married    Spouse name: Not on file   Number of children: 2   Years of education: 14   Highest education level: Associate degree: academic program  Occupational History   Occupation: Retired  Tobacco Use   Smoking status: Former    Current packs/day: 0.00    Average packs/day: 0.5 packs/day for 20.0 years (10.0 ttl pk-yrs)    Types: Cigarettes    Start date: 19    Quit date: 1975    Years since quitting: 50.3   Smokeless tobacco: Never  Vaping Use   Vaping status: Never Used  Substance and Sexual Activity   Alcohol use: Not Currently    Comment: very rarely - every now and then 1 drink/1 year if that   Drug use: No   Sexual activity: Not Currently  Other Topics Concern   Not on file  Social History Narrative   Right handed   Dirnks caffeine prn   Married   Lives with wife   Social  Drivers of Health   Financial Resource Strain: Low Risk  (07/23/2021)   Overall Financial Resource Strain (CARDIA)    Difficulty of Paying Living Expenses: Not hard at all  Food Insecurity: No Food Insecurity (08/31/2023)   Hunger Vital Sign    Worried About Running Out of Food in the Last Year: Never true    Ran Out of Food in the Last Year: Never true  Transportation Needs: No Transportation Needs (08/31/2023)   PRAPARE - Administrator, Civil Service (Medical): No    Lack of Transportation (Non-Medical): No  Physical Activity: Inactive (07/23/2021)   Exercise Vital Sign    Days of Exercise per Week: 0 days    Minutes of Exercise per Session: 0 min  Stress: No Stress Concern Present (07/23/2021)   [employer] of Occupational Health - Occupational Stress Questionnaire    Feeling of Stress : Not at all  Social Connections: Moderately Integrated (08/31/2023)   Social Connection and Isolation Panel [NHANES]    Frequency of Communication with Friends and Family: Once a week    Frequency of Social Gatherings with Friends and Family: Never    Attends Religious Services: 1 to 4 times per year    Active Member of [employer] or Organizations: Yes    Attends Banker Meetings: 1 to 4 times per year    Marital Status: Married  Catering manager Violence: Not At Risk (08/31/2023)   Humiliation, Afraid, Rape, and Kick questionnaire    Fear of Current or Ex-Partner: No    Emotionally Abused: No    Physically Abused: No    Sexually Abused: No    Family History:   Family History  Problem Relation Age of Onset   Diabetes Mother    Hypertension Mother    Diabetes Father    Diabetes Brother    Hypertension Brother    Bone cancer Brother    Diabetes Brother      ROS:  Please see the history of present illness.   All other ROS reviewed and negative.     Physical Exam/Data:   Vitals:   08/31/23 0630 08/31/23 0645 08/31/23 0700 08/31/23 0814  BP: 114/79 122/72 110/71  116/77  Pulse: 99 86 98 68  Resp: (!) 38 (!) 25 20   Temp:  TempSrc:      SpO2: 97% 97% 97% 98%   No intake or output data in the 24 hours ending 08/31/23 1144    08/04/2023    3:41 PM 04/12/2023   10:30 AM 04/07/2023    2:02 PM  Last 3 Weights  Weight (lbs) 206 lb 206 lb 202 lb 13.1 oz  Weight (kg) 93.441 kg 93.441 kg 91.999 kg     There is no height or weight on file to calculate BMI.  General:  Well nourished, well developed, in no acute distress HEENT: normal Neck: no JVD Vascular: Radial pulses 2+ bilaterally Cardiac:  normal S1, S2; RRR; no murmur   Lungs:  clear to auscultation bilaterally, no wheezing, rhonchi or rales. Normal WOB on room air  Abd: soft. Tender to light palpation in the RLQ and periumbilical area  Ext: no edema in BLE  Musculoskeletal:  No deformities Skin: warm and dry  Neuro:  no focal abnormalities noted Psych:  Normal affect   EKG:  The EKG was personally reviewed and demonstrates:  No EKG this admission  Telemetry:  Telemetry was personally reviewed and demonstrates:  sinus tachycardia   Relevant CV Studies: Cardiac Studies & Procedures   ______________________________________________________________________________________________ CARDIAC CATHETERIZATION  CARDIAC CATHETERIZATION 08/10/2019  Conclusion LM: Normal LAD: Ostial 95% stenosis Ramus: Ostial/prox 30% stenosis LCx: Ostial 75% stenosis RCA: Prox-mid diffuse 40% calcific disease Distal RCA 70% ISR  LVEDP 17 mmHg  Findings Coronary Findings Diagnostic  Dominance: Right  Left Main Vessel is normal in caliber. Vessel is angiographically normal.  Left Anterior Descending Vessel is angiographically normal. Ost LAD lesion is 95% stenosed.  Ramus Intermedius Vessel is normal in caliber. Ramus lesion is 30% stenosed.  Left Circumflex Vessel is normal in caliber. Ost Cx lesion is 75% stenosed.  Right Coronary Artery Vessel is normal in caliber. Prox RCA to Mid RCA  lesion is 40% stenosed. The lesion is moderately calcified. Dist RCA lesion is 70% stenosed. The lesion was previously treated using a drug eluting stent over 2 years ago.  Intervention  No interventions have been documented.   STRESS TESTS  PCV MYOCARDIAL PERFUSION WITH LEXISCAN  01/19/2022  Narrative Lexiscan  Tetrofosmin  stress test 01/19/2022: 1 Day Rest/Stress protocol. Stress EKG is non-diagnostic as target heart rate not achieved, as this is pharmacological stress test using Lexiscan . Hypertensive response at rest (BP 200/100 mmHG) Normal myocardial perfusion without obvious evidence of reversible myocardial ischemia or prior infarct. Left ventricular wall thickness is preserved with mild septal hypokinesis likely from mechanical effects of post-operative sternum. Calculated LVEF 54%. Prior study 01/20/2021: Per report normal myocardial perfusion, stress LVEF 49%, visually appears normal. Low risk study.   ECHOCARDIOGRAM  PCV ECHOCARDIOGRAM COMPLETE 05/12/2021  Narrative Echocardiogram 05/12/2021: Poor echo window. Wall motion with reduced sensitivity. Left ventricle cavity is normal in size. Mild concentric remodeling of the left ventricle. Normal global wall motion. Abnormal septal wall motion due to post-operative coronary artery bypass graft. Doppler evidence of grade I (impaired) diastolic dysfunction, normal LAP. Normal LV systolic function with visual EF 50-55%. Calculated EF 56%. No significant change from 07/22/2020.   TEE  ECHO INTRAOPERATIVE TEE 08/15/2019  Narrative *INTRAOPERATIVE TRANSESOPHAGEAL REPORT *    Patient Name:   Christian [name] Date of Exam: 08/15/2019 Medical Rec #:  [id number]       Height:       69.0 in Accession #:    [id number]      Weight:       220.9 lb Date  of Birth:  [birth year]      BSA:          2.16 m Patient Age:    73 years        BP:           148/68 mmHg Patient Gender: M               HR:           110 bpm. Exam Location:   Anesthesiology  Transesophogeal exam was perform intraoperatively during surgical procedure. Patient was closely monitored under general anesthesia during the entirety of examination.  Indications:     CAD Sonographer:     Joleen Navy RDCS (AE) Performing Phys: [id number] Z ATKINS Diagnosing Phys: Jake Mayers MD  Complications: No known complications during this procedure. POST-OP IMPRESSIONS - Left Ventricle: has moderately reduced systolic function. The cavity size was severely dilated. The wall motion is abnormal with regional variation. The EF is 30-35% with global hypokinesis. There is akinesis of the anterior wall. - Aorta: The aorta appears unchanged from pre-bypass. - Aortic Valve: The aortic valve appears unchanged from pre-bypass. - Mitral Valve: The mitral valve appears unchanged from pre-bypass. - Tricuspid Valve: The tricuspid valve appears unchanged from pre-bypass.  PRE-OP FINDINGS Left Ventricle: The left ventricle has mildly reduced systolic function, with an ejection fraction of 45-50%. The cavity size was moderately dilated. There is moderately increased left ventricular wall thickness.  Right Ventricle: The right ventricle has normal systolic function. The cavity was normal. There is no increase in right ventricular wall thickness.  Left Atrium: Left atrial size was not assessed. The left atrial appendage is well visualized and there is no evidence of thrombus present.  Right Atrium: Right atrial size was not assessed.  Interatrial Septum: No atrial level shunt detected by color flow Doppler.  Pericardium: There is no evidence of pericardial effusion.  Mitral Valve: The mitral valve is normal in structure. No thickening of the mitral valve leaflet. No calcification of the mitral valve leaflet. Mitral valve regurgitation is not visualized by color flow Doppler.  Tricuspid Valve: The tricuspid valve was normal in structure. Tricuspid valve  regurgitation was not visualized by color flow Doppler.  Aortic Valve: The aortic valve is normal in structure. Aortic valve regurgitation was not visualized by color flow Doppler. There is no evidence of aortic valve stenosis.  Pulmonic Valve: The pulmonic valve was normal in structure. Pulmonic valve regurgitation is not visualized by color flow Doppler.   +--------------+-------++ LEFT VENTRICLE        +--------------+-------++ PLAX 2D               +--------------+-------++ LVIDd:        5.10 cm +--------------+-------++ LVIDs:        3.80 cm +--------------+-------++ LV SV:        62 ml   +--------------+-------++ LV SV Index:  27.60   +--------------+-------++                       +--------------+-------++   Jake Mayers MD Electronically signed by Jake Mayers MD Signature Date/Time: 08/15/2019/2:49:29 PM    Final        ______________________________________________________________________________________________       Laboratory Data:  High Sensitivity Troponin:  No results for input(s): "TROPONINIHS" in the last 720 hours.   Chemistry Recent Labs  Lab 08/30/23 1727 08/31/23 0416  NA 138 141  K 3.4* 3.0*  CL 99 98  CO2  24 25  GLUCOSE 74 79  BUN 86* 90*  CREATININE 3.55* 3.88*  CALCIUM  9.5 9.3  GFRNONAA 17* 15*  ANIONGAP 15 18*    Recent Labs  Lab 08/30/23 1727 08/31/23 0416  PROT 7.5 7.1  ALBUMIN  3.2* 2.9*  AST 24 21  ALT 22 19  ALKPHOS 66 67  BILITOT 0.7 0.9   Lipids No results for input(s): "CHOL", "TRIG", "HDL", "LABVLDL", "LDLCALC", "CHOLHDL" in the last 168 hours.  Hematology Recent Labs  Lab 08/30/23 1727 08/31/23 0533  WBC 17.0* 17.0*  RBC 4.21* 4.13*  HGB 14.2 14.2  HCT 44.0 43.1  MCV 104.5* 104.4*  MCH 33.7 34.4*  MCHC 32.3 32.9  RDW 14.1 14.3  PLT 220 209   Thyroid  No results for input(s): "TSH", "FREET4" in the last 168 hours.  BNPNo results for input(s): "BNP", "PROBNP"  in the last 168 hours.  DDimer No results for input(s): "DDIMER" in the last 168 hours.   Radiology/Studies:  CT ABDOMEN PELVIS WO CONTRAST Result Date: 08/30/2023 CLINICAL DATA:  RLQ abdominal pain EXAM: CT ABDOMEN AND PELVIS WITHOUT CONTRAST TECHNIQUE: Multidetector CT imaging of the abdomen and pelvis was performed following the standard protocol without IV contrast. RADIATION DOSE REDUCTION: This exam was performed according to the departmental dose-optimization program which includes automated exposure control, adjustment of the mA and/or kV according to patient size and/or use of iterative reconstruction technique. COMPARISON:  September 28, 2019 FINDINGS: Of note, the lack of intravenous contrast limits evaluation of the solid organ parenchyma and vascularity. Lower chest: Trace right and small left pleural effusions. Rounded airspace consolidation in the left lower lobe, likely round atelectasis. Right coronary atherosclerosis. Hepatobiliary: No mass. No radiopaque stones or wall thickening of the gallbladder. No intrahepatic or extrahepatic biliary ductal dilation. Pancreas: No mass or main ductal dilation. No peripancreatic inflammation or fluid collection. Spleen: Normal size. No mass. Adrenals/Urinary Tract: Unchanged diffuse nodular thickening of both adrenal glands. Unchanged small bilateral renal cysts. No renal mass. No hydronephrosis or nephrolithiasis. Circumferential wall thickening of the urinary bladder. Stomach/Bowel: The stomach is decompressed without focal abnormality. No small bowel wall thickening or inflammation. No small bowel obstruction.Distended, inflamed appendix with wall thickening. The appendiceal tip is most notably enlarged measuring 2 cm. A couple of small gas locules noted along the tip (axial 64). Small, mixed density, ovoid structure along the sigmoid mesocolon (axial 65, sagittal 110), measuring 17 x 23 x 14 mm. Descending and sigmoid colonic diverticulosis. Mild wall  thickening of the sigmoid colon, likely reactive. Vascular/Lymphatic: No aortic aneurysm. Diffuse aortoiliac atherosclerosis. No intraabdominal or pelvic lymphadenopathy. Reproductive: No prostatomegaly. No free pelvic fluid. Other: No pneumoperitoneum, ascites, or mesenteric inflammation. Musculoskeletal: No acute fracture or destructive lesion. Multilevel degenerative disc disease of the spine. Osteopenia. Screw and plate fixation of the lower sternum. IMPRESSION: 1. Acute appendicitis. Apparent irregularity of the appendiceal tip with a couple of small locules of gas. While these locules of gas may be intraluminal, a small periappendiceal abscess containing locules of gas could also have this appearance and would be worrisome for perforation. 2. Small, mixed density, ovoid structure along the sigmoid mesocolon (axial 65, sagittal 110), measuring 17 x 23 x 14 mm, worrisome for a small periappendiceal abscess. 3. Descending and sigmoid colonic diverticulosis. Mild wall thickening of the sigmoid colon, is likely reactive. 4. Small left pleural effusion with a trace right pleural effusion. Rounded airspace consolidation in the left lower lobe, likely round atelectasis. Electronically Signed   By: Rance Burrows  M.D.   On: 08/30/2023 22:23     Assessment and Plan:   Preoperative Evaluation  CAD s/p CABG - Patient previously had CABG x4 in 07/2019. Post op course complicated by cardiogenic shock, cardiac arrest requiring internal CPR in the ICU after opening the chest, atrial fibrillation requiring cardioversion, AKI needing dialysis, right brain infarct with left hemiparesis, multiple blood transfusions  - Most recent echocardiogram from 04/2021 showed EF 50-55%, abnormal septal wall motion due to post op CABG - Most recent nuclear stress test from 12/2021 showed normal myocardial perfusion without obvious evidence of reversible myocardial ischemia or prior infarct  - In addition to patient's cardiac history,  he also has CKD stage IV - He reports being stable from a cardiac perspective. No exertional chest pain. Able to complete at least 4 METS physical activity (climb stairs, housework, grocery shopping). No evidence of hypervolemia on exam  - Discussed with patient that given his past cardiac history and comorbitidities, he is at an increased risk for perioperative complications. However, this risk is not prohibitive. He is optimized from a cardiac standpoint.  - Needs an EKG prior to surgery, order has been placed. I do not anticipate any further cardiac workup will be needed prior to surgery  - MD to see and finalize preop recs   HTN - BP currently low-normal  - OK to hold home BP medications prior to surgery   Otherwise per primary  - Appendicitis with possible periappendiceal abscess  - CKD stage IV  - Type 2 DM   Risk Assessment/Risk Scores:   [place]  Heart Association (NYHA) Functional Class NYHA Class I    For questions or updates, please contact [facility] Please consult [email or web address] for contact info under    Signed, Eilleen Grates, MD  08/31/2023 11:44 AM  History and all data above reviewed.  Patient examined.  I agree with the findings as above.  The patient presents with 4 - 5 days of abdominal pain and is found to have appendicitis.  He has a complicated cardiac history as above.  However, he does not have any recent cardiovascular symptoms.  He does get around slowly at home and walks with a walker at times.  The patient denies any new symptoms such as chest discomfort, neck or arm discomfort. There has been no new shortness of breath, PND or orthopnea. There have been no reported palpitations, presyncope or syncope.   The patient exam reveals COR:RRR, no murmurs  ,  Lungs: Clear  ,  Abd: Positive bowel sounds decreased in frequency, no rebound positive guarding, Ext No edema  .  All available labs, radiology testing, previous records reviewed. Agree with documented  assessment and plan.  Preop:  The patient has a lower functional level.  He has no acute symptoms or findings.  He is not going for a high risk procedure.   According to ACC/AHA guidelines no further cardiovascular testing is indicated.     Christian [name]  12:05 PM  08/31/2023

## 2023-08-31 NOTE — Progress Notes (Signed)
 No charge note.  Patient was admitted earlier this morning. Please see admission H&P by Dr. [facility] Lavender.  Briefly, patient is a 78 year old with history of CAD status post CABG with postoperative CVA and renal failure necessitating brief hemodialysis who presented to the emergency department with complaints of right lower quadrant abdominal pain.  Imaging confirmed acute appendicitis.  General surgery was consulted and cardiology was consulted to assist with preoperative cardiac clearance.  Vital signs this morning stable with blood pressure 110/71, heart rate 98, respiratory rate 20 Patient was lying in bed awake alert oriented, generalized abdominal pain, lungs clear to auscultation  Sodium 141, potassium 3.0, creatinine 3.88, WBC 17.0, hemoglobin 14.2  Assessment plan:  Appendicitis with possible periappendiceal abscess Patient continued on empiric Zosyn General surgery consulted.  Patient has since undergone surgery on 5/6 Postop care and diet advancement deferred to general surgery CAD status post CABG  denies any chest pain.   Takes statins and antiplatelet agents. Cardiology was consulted for preoperative clearance No further cardiovascular testing indicated Hypertension t takes Lasix , metolazone  hydralazine  and Imdur presently on as needed IV hydralazine . Chronic kidney disease stage IV  creatinine noted to be around baseline. Recheck basic metabolic panel in the morning Diabetes mellitus type 2  takes glimepiride  at home. Continue sliding scale regimen as needed while in hospital  Cherylle Corwin , MD [facility]

## 2023-08-31 NOTE — H&P (Signed)
 History and Physical    [name]:[id number] DOB: [birth year] DOA: 08/30/2023  Patient coming from: Home.  Chief Complaint: Abdominal pain.  HPI: Christian [name] is a 78 y.o. male with history of CAD status post CABG, postoperative period of CABG was complicated with stroke and also renal failure was on dialysis briefly eventually presently off dialysis with history of hypertension diabetes mellitus type 2 presents to the ER with complaints of right lower quadrant abdominal pain ongoing for last 3 days.  About 3 days ago patient started having abdominal pain and had initially 2-3 episodes of vomiting.  Denies any diarrhea.  Due to persistent pain patient presents to the ER.  ED Course: CT abdomen pelvis in the ER shows features concerning for appendicitis with possible periappendiceal abscess.  Labs showed WBC of 17 hemoglobin 14.2 creatinine 3.5 potassium 3.4.  On-call general surgeon Dr. Leighton Punches was consulted and patient is being admitted for further management.  Review of Systems: As per HPI, rest all negative.   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   Diabetic polyneuropathy    Dry eyes left   Elevated liver enzymes 08/09/2019   Hiatal hernia    History of coronary artery stent placement    History of pulmonary embolism 08/09/2019   HTN (hypertension) 11/12/2009   Hyperlipidemia associated with type 2 diabetes mellitus 11/12/2009   Incomplete bladder emptying    Ischemic cardiomyopathy    Left leg weakness 11/23/2018   Mild dementia, 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 06/09/2023   Neck swelling 05/16/2021   Nocturia    NSTEMI (non-ST elevation myocardial infarction)  08/09/2019   tamponade   Pneumonia    as a child   Problems with swallowing    pt states test at [facility] approx 2012 shows a gastric valve  dysfunction--  eats small bites and drink liquids slowly   Pruritus 11/28/2019   Pulmonary edema 10/03/2020   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 LIMA TO LAD, RIMA TO PDA, RADIAL ARTERY TO CIRC AND SEQUENTIALLY TO OM1.   Shortness of breath 11/12/2009   Sleep difficulties 05/30/2020   Swelling of lower extremity    Type II diabetes mellitus 10/10/2014    Past Surgical History:  Procedure Laterality Date   A/V FISTULAGRAM Left 08/12/2022   Procedure: A/V Fistulagram;  Surgeon: Kayla Part, MD;  Location: [facility] INVASIVE CV LAB;  Service: Cardiovascular;  Laterality: Left;   APPLICATION OF WOUND VAC N/A 08/24/2019   Procedure: APPLICATION OF WOUND VAC;  Surgeon: Rudine Cos, MD;  Location: [facility] OR;  Service: Thoracic;  Laterality: N/A;   APPLICATION OF WOUND VAC  08/29/2019   Procedure: Wound Vac change;  Surgeon: Rudine Cos, MD;  Location: [facility] OR;  Service: Open Heart Surgery;;   APPLICATION OF WOUND VAC N/A 09/04/2019   Procedure: Application Of Wound Vac;  Surgeon: Norita Beauvais, MD;  Location: [facility] OR;  Service: Open Heart Surgery;  Laterality: N/A;   APPLICATION OF WOUND VAC N/A 09/06/2019   Procedure: APPLICATION OF ACELL, APPLICATION OF WOUND VAC USING PREVENA INCISIONAL  DRESSING;  Surgeon: Thornell Flirt, DO;  Location: [facility]  OR;  Service: Government social research officer;  Laterality: N/A;   AV FISTULA PLACEMENT Left 09/18/2020   Procedure: ARTERIOVENOUS (AV) FISTULA CREATION LEFT VERSUS GRAFT;  Surgeon: Margherita Shell, MD;  Location: [facility] OR;  Service: Vascular;  Laterality: Left;   BASCILIC VEIN TRANSPOSITION Left 10/31/2020   Procedure: LEFT SECOND STAGE BASCILIC VEIN TRANSPOSITION;  Surgeon: Margherita Shell, MD;  Location: [facility] OR;  Service: Vascular;  Laterality: Left;   CARDIAC CATHETERIZATION     CORONARY  ARTERY BYPASS GRAFT N/A 08/15/2019   Procedure: CORONARY ARTERY BYPASS GRAFTING (CABG), x 4 using bilateral IMAs and left radial artery .  LIMA TO LAD, RIMA TO PDA, RADIAL ARTERY TO CIRC AND SEQUENTIALLY TO OM1.;  Surgeon: Rudine Cos, MD;  Location: [facility] OR;  Service: Open Heart Surgery;  Laterality: N/A;   CORONARY STENT PLACEMENT  02/27/2014   distal rt/pd coronary       dr Berry Bristol   CYSTO/ BLADDER BIOPSY'S/ CAUTHERIZATION  01-14-2004  DR Isla Mari   EXPLORATION POST OPERATIVE OPEN HEART N/A 08/16/2019   Procedure: Chest Closure S?P CABG WITH APPLICATION OF PREVENA  INCISIONAL WOUND VAC;  Surgeon: Rudine Cos, MD;  Location: [facility] OR;  Service: Open Heart Surgery;  Laterality: N/A;   EXPLORATION POST OPERATIVE OPEN HEART N/A 08/21/2019   Procedure: CHEST WASHOUT S/P OPEN CHEST;  Surgeon: Rudine Cos, MD;  Location: [facility] OR;  Service: Open Heart Surgery;  Laterality: N/A;  Open chest with Esmark dressing with Ioban sealant coverage.   EXPLORATION POST OPERATIVE OPEN HEART N/A 08/18/2019   Procedure: EXPLORATION POST OPERATIVE OPEN HEART (performed 04/23 on unit);  Surgeon: Rudine Cos, MD;  Location: [facility] OR;  Service: Open Heart Surgery;  Laterality: N/A;   EXPLORATION POST OPERATIVE OPEN HEART N/A 08/24/2019   Procedure: CHEST WASHOUT POST OPERATIVE OPEN HEART;  Surgeon: Rudine Cos, MD;  Location: [facility];  Service: Open Heart Surgery;  Laterality: N/A;   EXPLORATION POST OPERATIVE OPEN HEART N/A 08/29/2019   Procedure: CHEST WOUND WASHOUT POST OPERATIVE OPEN HEART;  Surgeon: Rudine Cos, MD;  Location: [facility];  Service: Open Heart Surgery;  Laterality: N/A;   EXPLORATION POST OPERATIVE OPEN HEART N/A 09/04/2019   Procedure: MEDIASTINAL EXPLORATION WITH STERNAL WOUND IRRIGATION;  Surgeon: Norita Beauvais, MD;  Location: [facility] OR;  Service: Open Heart Surgery;  Laterality: N/A;   EXPLORATION POST OPERATIVE OPEN HEART N/A 09/14/2019   Procedure: EVACUATION OF HEMATOMA;  Surgeon:  Norita Beauvais, MD;  Location: [facility] OR;  Service: Open Heart Surgery;  Laterality: N/A;   IR FLUORO GUIDE CV LINE LEFT  09/19/2019   IR GASTROSTOMY TUBE MOD SED  10/04/2019   IR GASTROSTOMY TUBE REMOVAL  11/29/2019   IR PATIENT EVAL TECH 0-60 MINS  09/29/2019   IR US  GUIDE VASC ACCESS LEFT  09/19/2019   LAPAROSCOPIC LYSIS OF ADHESIONS N/A 09/06/2019   Procedure: LAPAROSCOPIC OMENTAL HARVEST;  Surgeon: Derral Flick, MD;  Location: [facility];  Service: General;  Laterality: N/A;   LEFT HEART CATH AND CORONARY ANGIOGRAPHY N/A 08/10/2019   Procedure: LEFT HEART CATH AND CORONARY ANGIOGRAPHY;  Surgeon: Cody Das, MD;  [facility]: [facility] INVASIVE CV LAB;  Service: Cardiovascular;  Laterality: N/A;   LEFT HEART CATHETERIZATION WITH CORONARY ANGIOGRAM N/A 02/27/2014   Procedure: LEFT HEART CATHETERIZATION WITH CORONARY ANGIOGRAM;  Surgeon: Jessica Morn, MD;  Location: [facility] CATH LAB;  Service: Cardiovascular;  Laterality: N/A;   MEDIASTINAL EXPLORATION N/A 09/06/2019   Procedure: MEDIASTINAL EXPLORATION;  Surgeon: Norita Beauvais, MD;  Location: [facility] OR;  Service: Thoracic;  Laterality: N/A;   PECTORALIS FLAP  09/06/2019   Procedure: Pectoralis ADVANCEMENT Flap;  Surgeon: Thornell Flirt, DO;  Location: [facility] OR;  Service: Plastics;;   PERCUTANEOUS CORONARY STENT INTERVENTION (PCI-S)  02/27/2014   Procedure: PERCUTANEOUS CORONARY STENT INTERVENTION (PCI-S);  Surgeon: Jessica Morn, MD;  Location: [facility] CATH LAB;  Service: Cardiovascular;;  rt PDA  3.0/28mm Promus stent   PERIPHERAL VASCULAR INTERVENTION Left 08/12/2022   Procedure: PERIPHERAL VASCULAR INTERVENTION;  Surgeon: Kayla Part, MD;  Location: [facility] INVASIVE CV LAB;  Service: Cardiovascular;  Laterality: Left;   RADIAL ARTERY HARVEST Left 08/15/2019   Procedure: Radial Artery Harvest;  Surgeon: Rudine Cos, MD;  Location: [facility] OR;  Service: Open Heart Surgery;  Laterality: Left;   RIB PLATING N/A 09/06/2019   Procedure: STERNAL  PLATING;  Surgeon: Norita Beauvais, MD;  Location: [facility] OR;  Service: Thoracic;  Laterality: N/A;   TEE WITHOUT CARDIOVERSION N/A 08/15/2019   Procedure: TRANSESOPHAGEAL ECHOCARDIOGRAM (TEE);  Surgeon: Rudine Cos, MD;  Location: [facility] OR;  Service: Open Heart Surgery;  Laterality: N/A;   TRACHEOSTOMY TUBE PLACEMENT  08/29/2019   Procedure: Tracheostomy;  Surgeon: Rudine Cos, MD;  Location: [facility] OR;  Service: Open Heart Surgery; Decannulated 6/10/20211   TRANSURETHRAL RESECTION OF PROSTATE  04/04/2012   Procedure: TRANSURETHRAL RESECTION OF THE PROSTATE WITH GYRUS INSTRUMENTS;  Surgeon: Edmund Gouge, MD;  Location: [facility];  Service: Urology;  Laterality: N/A;   TRANSURETHRAL RESECTION OF PROSTATE N/A 09/27/2014   Procedure: TRANSURETHRAL RESECTION OF THE PROSTATE ;  Surgeon: Annamarie Kid, MD;  Location: [facility] ORS;  Service: Urology;  Laterality: N/A;   UPPER GASTROINTESTINAL ENDOSCOPY       reports that he quit smoking about 50 years ago. His smoking use included cigarettes. He started smoking about 70 years ago. He has a 10 pack-year smoking history. He has never used smokeless tobacco. He reports that he does not currently use alcohol. He reports that he does not use drugs.  No Known Allergies  Family History  Problem Relation Age of Onset   Diabetes Mother    Hypertension Mother    Diabetes Father    Diabetes Brother    Hypertension Brother    Bone cancer Brother    Diabetes Brother     Prior to Admission medications   Medication Sig Start Date End Date Taking? Authorizing Provider  albuterol  (VENTOLIN  HFA) 108 (90 Base) MCG/ACT inhaler TAKE 2 PUFFS BY MOUTH EVERY 6 HOURS AS NEEDED FOR WHEEZE OR SHORTNESS OF BREATH 04/22/21   [name], MD  atorvastatin  (LIPITOR) 20 MG tablet Take 1 tablet (20 mg total) by mouth every evening. 08/24/23   Knox Perl, MD  budesonide -formoterol  (SYMBICORT ) 160-4.5 MCG/ACT inhaler INHALE 2 PUFFS INTO THE  LUNGS TWICE A DAY 12/09/22   Wilfredo Hanly, MD  calcitRIOL (ROCALTROL) 0.25 MCG capsule Take 0.25 mcg by mouth 3 (three) times a week. 07/21/23   [provider]  clonazePAM  (KLONOPIN ) 0.5 MG disintegrating tablet TAKE 1 TABLET BY MOUTH TWICE A DAY 03/15/23   [name], MD  clopidogrel  (PLAVIX ) 75 MG tablet Take 1 tablet (75 mg total) by mouth daily. 06/24/23   Knox Perl, MD  cromolyn  (OPTICROM ) 4 % ophthalmic solution Place 1 drop into both eyes every Wednesday. At bedtime 12/08/19   [provider]  [employer] ACETAMINOPHEN  325 MG tablet TAKE 2 CAPS BY  MOUTH EVERY 6 HOURS AS NEEDED FOR PAIN FOR TEMP 100F OR ABOVE Patient taking differently: Take 325 mg by mouth every 6 (six) hours as needed for mild pain (pain score 1-3) or headache (temp over 100). 02/23/20   [name], MD  [employer] SENNA PLUS 8.6-50 MG tablet TAKE 2 TABLETS BY MOUTH AT BEDTIME FOR CONSTIPATION 08/14/21   [name], MD  ferrous sulfate  325 (65 FE) MG tablet Take 325 mg by mouth daily with breakfast.    [provider]  fluticasone  (FLONASE ) 50 MCG/ACT nasal spray SPRAY 1 SPRAY INTO EACH NOSTRIL EVERY DAY 05/11/22   Wilfredo Hanly, MD  furosemide  (LASIX ) 80 MG tablet Take 120 mg by mouth daily. 11/30/20   [provider]  glimepiride  (AMARYL ) 2 MG tablet TAKE 1 TABLET BY MOUTH DAILY BEFORE BREAKFAST. 08/09/23   [name], MD  hydrALAZINE  (APRESOLINE ) 100 MG tablet TAKE 1 TABLET BY MOUTH 3 TIMES DAILY. 10/12/22   Knox Perl, MD  isosorbide  dinitrate (ISORDIL ) 30 MG tablet TAKE 1 TABLET BY MOUTH 3 TIMES DAILY. 05/03/23   [name], MD  lidocaine  (LIDODERM ) 5 % Place 1 patch onto the skin daily. Remove & Discard patch within 12 hours or as directed by MD 04/06/23   Prosperi, Lynder Sanger H, PA-C  methocarbamol  (ROBAXIN ) 500 MG tablet Take 1 tablet (500 mg total) by mouth 2 (two) times daily. 04/06/23   Prosperi, Christian H, PA-C  metolazone  (ZAROXOLYN ) 5  MG tablet Take 5 mg by mouth 2 (two) times a week.    [provider]  metoprolol  succinate (TOPROL -XL) 50 MG 24 hr tablet Take 3 tablets (150 mg total) by mouth daily. Take with or immediately following a meal. 05/11/23   Knox Perl, MD  Miconazole Nitrate (ATHLETES FOOT POWDER SPRAY) 2 % AERP Apply between toes once daily. 08/04/23   Luella Sager, DPM  Multiple Vitamin (MULTIVITAMIN) tablet Take 1 tablet by mouth daily.    [provider]  nitroGLYCERIN  (NITROSTAT ) 0.4 MG SL tablet Place 0.4 mg under the tongue every 5 (five) minutes as needed for chest pain. 10/25/19   [provider]  potassium chloride  (KLOR-CON ) 10 MEQ tablet Take 30 mEq by mouth daily. 10/25/20   [provider]  PREVIDENT  5000 BOOSTER PLUS 1.1 % PSTE TAKE 1 APPLICATION BY MOUTH EVERY EVENING. 09/14/22   [name], MD  sertraline  (ZOLOFT ) 25 MG tablet TAKE 1 TABLET BY MOUTH EVERY DAY 10/26/22   [name], MD  silodosin (RAPAFLO) 8 MG CAPS capsule Take 1 capsule (8 mg total) by mouth at bedtime. 09/25/21   McKenzie, Arden Beck, MD  Vitamin D , Ergocalciferol , (DRISDOL ) 1.25 MG (50000 UNIT) CAPS capsule TAKE 1 CAPSULE (50,000 UNITS TOTAL) BY MOUTH EVERY 7 (SEVEN) DAYS 02/19/23   [name], MD    Physical Exam: Constitutional: Moderately built and nourished. Vitals:   08/30/23 2000 08/30/23 2220 08/31/23 0300 08/31/23 0330  BP: 117/69 131/79 105/72   Pulse: 93 90 95 96  Resp: (!) 24 18 20  (!) 22  Temp:  97.9 F (36.6 C)  98.4 F (36.9 C)  TempSrc:  Oral  Oral  SpO2: 100% 99% 98% 97%   Eyes: Anicteric no pallor. ENMT: No discharge from the ears eyes nose and mouth. Neck: No mass felt.  No neck rigidity. Respiratory: No rhonchi or crepitations. Cardiovascular: S1-S2 heard. Abdomen: Tender in the right lower quadrant with guarding. Musculoskeletal: No edema. Skin: No rash. Neurologic: Alert awake  oriented to time place and person.  Moves all  extremities. Psychiatric: Appears normal.  Normal affect.   Labs on Admission: I have personally reviewed following labs and imaging studies  CBC: Recent Labs  Lab 08/30/23 1727  WBC 17.0*  HGB 14.2  HCT 44.0  MCV 104.5*  PLT 220   Basic Metabolic Panel: Recent Labs  Lab 08/30/23 1727  NA 138  K 3.4*  CL 99  CO2 24  GLUCOSE 74  BUN 86*  CREATININE 3.55*  CALCIUM  9.5   GFR: CrCl cannot be calculated (Unknown ideal weight.). Liver Function Tests: Recent Labs  Lab 08/30/23 1727  AST 24  ALT 22  ALKPHOS 66  BILITOT 0.7  PROT 7.5  ALBUMIN  3.2*   Recent Labs  Lab 08/30/23 1727  LIPASE 28   No results for input(s): "AMMONIA" in the last 168 hours. Coagulation Profile: No results for input(s): "INR", "PROTIME" in the last 168 hours. Cardiac Enzymes: No results for input(s): "CKTOTAL", "CKMB", "CKMBINDEX", "TROPONINI" in the last 168 hours. BNP (last 3 results) No results for input(s): "PROBNP" in the last 8760 hours. HbA1C: No results for input(s): "HGBA1C" in the last 72 hours. CBG: No results for input(s): "GLUCAP" in the last 168 hours. Lipid Profile: No results for input(s): "CHOL", "HDL", "LDLCALC", "TRIG", "CHOLHDL", "LDLDIRECT" in the last 72 hours. Thyroid  Function Tests: No results for input(s): "TSH", "T4TOTAL", "FREET4", "T3FREE", "THYROIDAB" in the last 72 hours. Anemia Panel: No results for input(s): "VITAMINB12", "FOLATE", "FERRITIN", "TIBC", "IRON", "RETICCTPCT" in the last 72 hours. Urine analysis:    Component Value Date/Time   COLORURINE YELLOW 08/30/2023 2224   APPEARANCEUR HAZY (A) 08/30/2023 2224   LABSPEC 1.009 08/30/2023 2224   PHURINE 5.0 08/30/2023 2224   GLUCOSEU NEGATIVE 08/30/2023 2224   HGBUR NEGATIVE 08/30/2023 2224   BILIRUBINUR NEGATIVE 08/30/2023 2224   KETONESUR NEGATIVE 08/30/2023 2224   PROTEINUR NEGATIVE 08/30/2023 2224   NITRITE NEGATIVE 08/30/2023 2224   LEUKOCYTESUR MODERATE (A) 08/30/2023 2224   Sepsis  Labs: @LABRCNTIP (procalcitonin:4,lacticidven:4) )No results found for this or any previous visit (from the past 240 hours).   Radiological Exams on Admission: CT ABDOMEN PELVIS WO CONTRAST Result Date: 08/30/2023 CLINICAL DATA:  RLQ abdominal pain EXAM: CT ABDOMEN AND PELVIS WITHOUT CONTRAST TECHNIQUE: Multidetector CT imaging of the abdomen and pelvis was performed following the standard protocol without IV contrast. RADIATION DOSE REDUCTION: This exam was performed according to the departmental dose-optimization program which includes automated exposure control, adjustment of the mA and/or kV according to patient size and/or use of iterative reconstruction technique. COMPARISON:  September 28, 2019 FINDINGS: Of note, the lack of intravenous contrast limits evaluation of the solid organ parenchyma and vascularity. Lower chest: Trace right and small left pleural effusions. Rounded airspace consolidation in the left lower lobe, likely round atelectasis. Right coronary atherosclerosis. Hepatobiliary: No mass. No radiopaque stones or wall thickening of the gallbladder. No intrahepatic or extrahepatic biliary ductal dilation. Pancreas: No mass or main ductal dilation. No peripancreatic inflammation or fluid collection. Spleen: Normal size. No mass. Adrenals/Urinary Tract: Unchanged diffuse nodular thickening of both adrenal glands. Unchanged small bilateral renal cysts. No renal mass. No hydronephrosis or nephrolithiasis. Circumferential wall thickening of the urinary bladder. Stomach/Bowel: The stomach is decompressed without focal abnormality. No small bowel wall thickening or inflammation. No small bowel obstruction.Distended, inflamed appendix with wall thickening. The appendiceal tip is most notably enlarged measuring 2 cm. A couple of small gas locules noted along the tip (axial 64). Small, mixed density, ovoid  structure along the sigmoid mesocolon (axial 65, sagittal 110), measuring 17 x 23 x 14 mm. Descending and  sigmoid colonic diverticulosis. Mild wall thickening of the sigmoid colon, likely reactive. Vascular/Lymphatic: No aortic aneurysm. Diffuse aortoiliac atherosclerosis. No intraabdominal or pelvic lymphadenopathy. Reproductive: No prostatomegaly. No free pelvic fluid. Other: No pneumoperitoneum, ascites, or mesenteric inflammation. Musculoskeletal: No acute fracture or destructive lesion. Multilevel degenerative disc disease of the spine. Osteopenia. Screw and plate fixation of the lower sternum. IMPRESSION: 1. Acute appendicitis. Apparent irregularity of the appendiceal tip with a couple of small locules of gas. While these locules of gas may be intraluminal, a small periappendiceal abscess containing locules of gas could also have this appearance and would be worrisome for perforation. 2. Small, mixed density, ovoid structure along the sigmoid mesocolon (axial 65, sagittal 110), measuring 17 x 23 x 14 mm, worrisome for a small periappendiceal abscess. 3. Descending and sigmoid colonic diverticulosis. Mild wall thickening of the sigmoid colon, is likely reactive. 4. Small left pleural effusion with a trace right pleural effusion. Rounded airspace consolidation in the left lower lobe, likely round atelectasis. Electronically Signed   By: Rance Burrows M.D.   On: 08/30/2023 22:23     Assessment/Plan Principal Problem:   Appendicitis Active Problems:   HTN (hypertension)   Type II diabetes mellitus   CAD (coronary artery disease)   Ischemic cardiomyopathy   S/P CABG x 4   CKD (chronic kidney disease) stage 4, GFR 15-29 ml/min   Acute appendicitis    Appendicitis with possible periappendiceal abscess -    General Surgery Dr. Leighton Punches has been consulted.  Will keep patient n.p.o. continue with IV Zosyn.  Pain relief medication.  Further recommendation per general surgery. CAD status post CABG denies any chest pain.  Takes statins and antiplatelet agents. Hypertension takes Lasix , metolazone  hydralazine   and Imdur presently on as needed IV hydralazine . Chronic kidney disease stage IV creatinine at around baseline. Diabetes mellitus type 2 takes glimepiride  at home.  Will keep patient on sliding scale coverage.  Hemoglobin A1c is 7.3.  Since patient has acute appendicitis with possible abscess formation will need close monitoring further management and more than 2 midnight stay.   DVT prophylaxis: SCDs. Code Status: Full code. Family Communication: Patient's wife. Disposition Plan: Monitored bed. Consults called: General Surgery. Admission status: Inpatient.

## 2023-08-31 NOTE — Anesthesia Preprocedure Evaluation (Signed)
 Anesthesia Evaluation  Patient identified by MRN, date of birth, ID band Patient awake    Reviewed: Allergy & Precautions, NPO status , Patient's Chart, lab work & pertinent test results  History of Anesthesia Complications Negative for: history of anesthetic complications  Airway Mallampati: III  TM Distance: >3 FB Neck ROM: Full    Dental  (+) Teeth Intact, Dental Advisory Given   Pulmonary shortness of breath, neg COPD, former smoker   breath sounds clear to auscultation       Cardiovascular hypertension, Pt. on medications and Pt. on home beta blockers + CAD, + Past MI, + CABG and +CHF   Rhythm:Regular Rate:Tachycardia  1. Left ventricular ejection fraction, by estimation, is 45 to 50%. The  left ventricle has low normal function. Left ventricular endocardial  border not optimally defined to evaluate regional wall motion. Left  ventricular diastolic function could not be  evaluated. There is mild of the left ventricular, inferolateral wall.   2. Right ventricular systolic function is normal. The right ventricular  size is normal.   3. Left atrial size was mildly dilated.   4. The mitral valve is normal in structure. Trivial mitral valve  regurgitation.   5. The aortic valve is tricuspid. Aortic valve regurgitation is not  visualized. Mild to moderate aortic valve sclerosis/calcification is  present, without any evidence of aortic stenosis.   6. The inferior vena cava is normal in size with greater than 50%  respiratory variability, suggesting right atrial pressure of 3 mmHg.     Neuro/Psych  Neuromuscular disease    GI/Hepatic Neg liver ROS,,,appendicitis   Endo/Other  diabetes    Renal/GU CRFRenal diseaseLab Results      Component                Value               Date                      NA                       141                 08/31/2023                K                        3.0 (L)              08/31/2023                CO2                      25                  08/31/2023                GLUCOSE                  79                  08/31/2023                BUN                      90 (H)  08/31/2023                CREATININE               3.88 (H)            08/31/2023                CALCIUM                   9.3                 08/31/2023                GFR                      18.36 (L)           04/12/2023                EGFR                     21.0                07/14/2023                GFRNONAA                 15 (L)              08/31/2023                Musculoskeletal   Abdominal   Peds  Hematology Lab Results      Component                Value               Date                      WBC                      17.0 (H)            08/31/2023                HGB                      14.2                08/31/2023                HCT                      43.1                08/31/2023                MCV                      104.4 (H)           08/31/2023                PLT                      209                 08/31/2023              Anesthesia Other Findings Expand All Collapse All  Cardiology Consultation   Patient ID: Christian [name] MRN: [id number]; DOB: 12-26-1945   Admit date: 08/30/2023 Date of Consult: 08/31/2023   PCP:  [name], MD              [facility] Providers Cardiologist:  Knox Perl, MD        Patient Profile:   Christian [name] is a 78 y.o. 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 Joan Mouton    History of Present Illness:   Christian [name] is a 78 year old male with above medical history who has been followed by Dr. Berry Bristol. Patient previously underwent stenting to the PDA in 02/2014. Later had NSTEMI on 08/09/19. Echocardiogram 08/10/19 showed EF 30-35%, grade II DD, normal RV systolic  function, normal PA systolic pressure. LHC on 08/10/19 showed 95% ostial LAD stenosis, 30% ostial/proximal ramus stenosis, 75% ostial LCX stenosis, diffuse 40% calcific disease in the proximal-mid RCA, 70% instent restenosis in the distal RCA. Ultimately underwent CABG x4 with LIMA-LAD, RIMA-PDA, radial artery-circ, SVG-OM1. After his CABG, he had several postoperative complications including cardiogenic shock, cardiac arrest requiring internal CPR in the ICU after opening the chest, atrial fibrillation requiring cardioversion, AKI needing dialysis, right brain infarct with left hemiparesis, multiple blood transfusions.    Later, echocardiogram in 06/2020 showed EF 67%, mild concentric LVH. Lexiscan  in 12/2020 showed normal myocardial perfusion and was overall a low risk study.    Most recent echocardiogram from 04/2021 showed EF 50-55%, abnormal septal wall motion due to post-operative CABG, grade I DD. Nuclear stress test from 12/2021 showed normal perfusion without obvious evidence of reversible myocardial ischemia or prior infarct.    Patient was last seen by cardiology on 03/22/23. At that time, patient had some soreness in his neck which was attributed to arthritis. He reported one episode of right sided chest pain that had not recurred.    Patient presented to the ED on 5/5 complaining of lower abdominal pain. Pain was most prominent in the periumbilical area and right lower quadrant. He had rebound tenderness.  CT abdomen pelvis showed acute appendicitis with possible perforation. He was admitted to the internal medicine service and started on IV antibiotics. General surgery was consulted. Cardiology consulted for preop evaluation given his coronary artery disease history    On interview, patient reports that he has been stable from a cardiac perspective. He has not had any symptoms similar to what he had prior to CABG in the past. He occasionally has brief episodes of right sided chest pain. This  occurs randomly and lasts a few minutes before resolving on its own. Symptoms are not associated with exertion. He is able to grocery shop, walk up and down stairs, and do housework without having chest pain/discomfort. He occasional notices that he is breathing harder than usual. If this occurs, he focuses on his breathing and his symptoms usually resolve. Denies orthopnea, lower extremity swelling     Past Medical History: Diagnosis Date  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  Aortic atherosclerosis 03/27/2020  Atypical pneumonia 12/12/2020  Benign prostatic hyperplasia 09/27/2014  CAD (coronary artery disease) 07/30/2018  CHF (congestive heart failure)    CKD (chronic kidney disease) stage 4, GFR 15-29 ml/min 03/27/2020  Diabetic polyneuropathy    Elevated liver enzymes 08/09/2019  Hiatal hernia    History of coronary artery stent placement  History of pulmonary embolism 08/09/2019  HTN (hypertension) 11/12/2009  Hyperlipidemia associated with type 2 diabetes mellitus 11/12/2009  Left leg weakness 11/23/2018  Mild dementia, 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 LIMA TO LAD, RIMA TO PDA, RADIAL ARTERY TO CIRC AND SEQUENTIALLY TO OM1.  Sleep difficulties 05/30/2020  Type II diabetes mellitus 10/10/2014        Past Surgical History: Procedure Laterality Date  A/V FISTULAGRAM Left 08/12/2022   Procedure: A/V Fistulagram;  Surgeon: Kayla Part, MD;  Location: [facility] INVASIVE CV LAB;  Service: Cardiovascular;  Laterality: Left;  APPLICATION OF WOUND VAC N/A 08/24/2019   Procedure: APPLICATION OF WOUND VAC;  Surgeon: Rudine Cos, MD;  Location: [facility] OR;  Service: Thoracic;  Laterality: N/A;  APPLICATION OF WOUND VAC   08/29/2019   Procedure: Wound Vac change;  Surgeon: Rudine Cos, MD;   Location: [facility] OR;  Service: Open Heart Surgery;;  APPLICATION OF WOUND VAC N/A 09/04/2019   Procedure: Application Of Wound Vac;  Surgeon: Norita Beauvais, MD;  Location: [facility] OR;  Service: Open Heart Surgery;  Laterality: N/A;  APPLICATION OF WOUND VAC N/A 09/06/2019   Procedure: APPLICATION OF ACELL, APPLICATION OF WOUND VAC USING PREVENA INCISIONAL  DRESSING;  Surgeon: Thornell Flirt, DO;  Location: [facility] OR;  Service: Plastics;  Laterality: N/A;  AV FISTULA PLACEMENT Left 09/18/2020   Procedure: ARTERIOVENOUS (AV) FISTULA CREATION LEFT VERSUS GRAFT;  Surgeon: Margherita Shell, MD;  Location: [facility] OR;  Service: Vascular;  Laterality: Left;  BASCILIC VEIN TRANSPOSITION Left 10/31/2020   Procedure: LEFT SECOND STAGE BASCILIC VEIN TRANSPOSITION;  Surgeon: Margherita Shell, MD;  Location: [facility] OR;  Service: Vascular;  Laterality: Left;  CARDIAC CATHETERIZATION      CORONARY ARTERY BYPASS GRAFT N/A 08/15/2019   Procedure: CORONARY ARTERY BYPASS GRAFTING (CABG), x 4 using bilateral IMAs and left radial artery .  LIMA TO LAD, RIMA TO PDA, RADIAL ARTERY TO CIRC AND SEQUENTIALLY TO OM1.;  Surgeon: Rudine Cos, MD;  Location: [facility] OR;  Service: Open Heart Surgery;  Laterality: N/A;  CORONARY STENT PLACEMENT   02/27/2014   distal rt/pd coronary       dr Berry Bristol  CYSTO/ BLADDER BIOPSY'S/ CAUTHERIZATION   01-14-2004  DR Isla Mari  EXPLORATION POST OPERATIVE OPEN HEART N/A 08/16/2019   Procedure: Chest Closure S?P CABG WITH APPLICATION OF PREVENA  INCISIONAL WOUND VAC;  Surgeon: Rudine Cos, MD;  Location: [facility] OR;  Service: Open Heart Surgery;  Laterality: N/A;  EXPLORATION POST OPERATIVE OPEN HEART N/A 08/21/2019   Procedure: CHEST WASHOUT S/P OPEN CHEST;  Surgeon: Rudine Cos, MD;  Location: [facility] OR;  Service: Open Heart Surgery;  Laterality: N/A;  Open chest with Esmark dressing with Ioban sealant coverage.  EXPLORATION POST OPERATIVE OPEN HEART N/A 08/18/2019   Procedure: EXPLORATION POST  OPERATIVE OPEN HEART (performed 04/23 on unit);  Surgeon: Rudine Cos, MD;  Location: [facility] OR;  Service: Open Heart Surgery;  Laterality: N/A;  EXPLORATION POST OPERATIVE OPEN HEART N/A 08/24/2019   Procedure: CHEST WASHOUT POST OPERATIVE OPEN HEART;  Surgeon: Rudine Cos, MD;  Location: [facility];  Service: Open Heart Surgery;  Laterality: N/A;  EXPLORATION POST OPERATIVE OPEN HEART N/A 08/29/2019   Procedure: CHEST WOUND WASHOUT POST OPERATIVE OPEN HEART;  Surgeon: Rudine Cos, MD;  Location: [facility]  OR;  Service: Open Heart Surgery;  Laterality: N/A;  EXPLORATION POST OPERATIVE OPEN HEART N/A 09/04/2019   Procedure: MEDIASTINAL EXPLORATION WITH STERNAL WOUND IRRIGATION;  Surgeon: Norita Beauvais, MD;  Location[facility] OR;  Service: Open Heart Surgery;  Laterality: N/A;  EXPLORATION POST OPERATIVE OPEN HEART N/A 09/14/2019   Procedure: EVACUATION OF HEMATOMA;  Surgeon: Norita Beauvais, MD;  Location: [facility] OR;  Service: Open Heart Surgery;  Laterality: N/A;  IR FLUORO GUIDE CV LINE LEFT   09/19/2019  IR GASTROSTOMY TUBE MOD SED   10/04/2019  IR GASTROSTOMY TUBE REMOVAL   11/29/2019  IR PATIENT EVAL TECH 0-60 MINS   09/29/2019  IR US  GUIDE VASC ACCESS LEFT   09/19/2019  LAPAROSCOPIC LYSIS OF ADHESIONS N/A 09/06/2019   Procedure: LAPAROSCOPIC OMENTAL HARVEST;  Surgeon: Derral Flick, MD;  Location: [facility] OR;  Service: General;  Laterality: N/A;  LEFT HEART CATH AND CORONARY ANGIOGRAPHY N/A 08/10/2019   Procedure: LEFT HEART CATH AND CORONARY ANGIOGRAPHY;  Surgeon: Cody Das, MD;  [facility]: [facility] INVASIVE CV LAB;  Service: Cardiovascular;  Laterality: N/A;  LEFT HEART CATHETERIZATION WITH CORONARY ANGIOGRAM N/A 02/27/2014   Procedure: LEFT HEART CATHETERIZATION WITH CORONARY ANGIOGRAM;  Surgeon: Jessica Morn, MD;  Location: [facility] CATH LAB;  Service: Cardiovascular;  Laterality: N/A;  MEDIASTINAL EXPLORATION N/A 09/06/2019   Procedure: MEDIASTINAL EXPLORATION;  Surgeon: Norita Beauvais, MD;  Location: [facility] OR;  Service: Thoracic;  Laterality: N/A;  PECTORALIS FLAP   09/06/2019   Procedure: Pectoralis ADVANCEMENT Flap;  Surgeon: Thornell Flirt, DO;  Location: [facility] OR;  Service: Plastics;;  PERCUTANEOUS CORONARY STENT INTERVENTION (PCI-S)   02/27/2014   Procedure: PERCUTANEOUS CORONARY STENT INTERVENTION (PCI-S);  Surgeon: Jessica Morn, MD;  Location: [facility] CATH LAB;  Service: Cardiovascular;;  rt PDA  3.0/28mm Promus stent  PERIPHERAL VASCULAR INTERVENTION Left 08/12/2022   Procedure: PERIPHERAL VASCULAR INTERVENTION;  Surgeon: Kayla Part, MD;  Location: [facility] INVASIVE CV LAB;  Service: Cardiovascular;  Laterality: Left;  RADIAL ARTERY HARVEST Left 08/15/2019   Procedure: Radial Artery Harvest;  Surgeon: Rudine Cos, MD;  Location: [facility] OR;  Service: Open Heart Surgery;  Laterality: Left;  RIB PLATING N/A 09/06/2019   Procedure: STERNAL PLATING;  Surgeon: Norita Beauvais, MD;  Location: [facility] OR;  Service: Thoracic;  Laterality: N/A;  TEE WITHOUT CARDIOVERSION N/A 08/15/2019   Procedure: TRANSESOPHAGEAL ECHOCARDIOGRAM (TEE);  Surgeon: Rudine Cos, MD;  Location: [facility] OR;  Service: Open Heart Surgery;  Laterality: N/A;  TRACHEOSTOMY TUBE PLACEMENT   08/29/2019   Procedure: Tracheostomy;  Surgeon: Rudine Cos, MD;  Location: [facility] OR;  Service: Open Heart Surgery; Decannulated 6/10/20211  TRANSURETHRAL RESECTION OF PROSTATE   04/04/2012   Procedure: TRANSURETHRAL RESECTION OF THE PROSTATE WITH GYRUS INSTRUMENTS;  Surgeon: Edmund Gouge, MD;  Location: [facility];  Service: Urology;  Laterality: N/A;  TRANSURETHRAL RESECTION OF PROSTATE N/A 09/27/2014   Procedure: TRANSURETHRAL RESECTION OF THE PROSTATE ;  Surgeon: Annamarie Kid, MD;  Location: [facility] ORS;  Service: Urology;  Laterality: N/A;  UPPER GASTROINTESTINAL ENDOSCOPY           Inpatient Medications: Scheduled Meds:   fluticasone  furoate-vilanterol  1  puff Inhalation Daily  insulin  aspart  0-6 Units Subcutaneous Q4H  metoprolol  tartrate  2.5 mg Intravenous Q6H     Continuous Infusions:   piperacillin-tazobactam (ZOSYN)  IV Stopped (08/31/23 0653)     PRN Meds:  albuterol , fentaNYL  (SUBLIMAZE ) injection, hydrALAZINE , LORazepam, ondansetron  (ZOFRAN ) IV  Allergies:     Allergies No Known Allergies    Social History:    Social History    Socioeconomic History  Marital status: Married     Spouse name: Not on file  Number of children: 2  Years of education: 14  Highest education level: Associate degree: academic program Occupational History  Occupation: Retired Tobacco Use  Smoking status: Former     Current packs/day: 0.00     Average packs/day: 0.5 packs/day for 20.0 years (10.0 ttl pk-yrs)     Types: Cigarettes     Start date: 59     Quit date: 1975     Years since quitting: 50.3  Smokeless tobacco: Never Vaping Use  Vaping status: Never Used Substance and Sexual Activity  Alcohol use: Not Currently     Comment: very rarely - every now and then 1 drink/1 year if that  Drug use: No  Sexual activity: Not Currently Other Topics Concern  Not on file Social History Narrative   Right handed   Dirnks caffeine prn   Married   Lives with wife    Social Drivers of Health    Financial Resource Strain: Low Risk  (07/23/2021)   Overall Financial Resource Strain (CARDIA)    Difficulty of Paying Living Expenses: Not hard at all Food Insecurity: No Food Insecurity (08/31/2023)   Hunger Vital Sign    Worried About Running Out of Food in the Last Year: Never true    Ran Out of Food in the Last Year: Never true Transportation Needs: No Transportation Needs (08/31/2023)   PRAPARE - Administrator, Civil Service (Medical): No    Lack of Transportation (Non-Medical): No Physical Activity: Inactive (07/23/2021)   Exercise Vital Sign    Days of Exercise per Week: 0 days    Minutes  of Exercise per Session: 0 min Stress: No Stress Concern Present (07/23/2021)   [employer] of Occupational Health - Occupational Stress Questionnaire    Feeling of Stress : Not at all Social Connections: Moderately Integrated (08/31/2023)   Social Connection and Isolation Panel [NHANES]    Frequency of Communication with Friends and Family: Once a week    Frequency of Social Gatherings with Friends and Family: Never    Attends Religious Services: 1 to 4 times per year    Active Member of [employer] or Organizations: Yes    Attends Banker Meetings: 1 to 4 times per year    Marital Status: Married Catering manager Violence: Not At Risk (08/31/2023)   Humiliation, Afraid, Rape, and Kick questionnaire    Fear of Current or Ex-Partner: No    Emotionally Abused: No    Physically Abused: No    Sexually Abused: No   Family History:    Family History Problem Relation Age of Onset  Diabetes Mother    Hypertension Mother    Diabetes Father    Diabetes Brother    Hypertension Brother    Bone cancer Brother    Diabetes Brother         ROS:  Please see the history of present illness.    All other ROS reviewed and negative.       Physical Exam/Data:    Vitals:   08/31/23 0630 08/31/23 0645 08/31/23 0700 08/31/23 0814 BP: 114/79 122/72 110/71 116/77 Pulse: 99 86 98 68 Resp: (!) 38 (!) 25 20   Temp:         TempSrc:  SpO2: 97% 97% 97% 98%   No intake or output data in the 24 hours ending 08/31/23 1144      08/04/2023    3:41 PM 04/12/2023   10:30 AM 04/07/2023    2:02 PM Last 3 Weights Weight (lbs) 206 lb 206 lb 202 lb 13.1 oz Weight (kg) 93.441 kg 93.441 kg 91.999 kg    There is no height or weight on file to calculate BMI.  General:  Well nourished, well developed, in no acute distress HEENT: normal Neck: no JVD Vascular: Radial pulses 2+ bilaterally Cardiac:  normal S1, S2; RRR; no murmur   Lungs:  clear to auscultation  bilaterally, no wheezing, rhonchi or rales. Normal WOB on room air  Abd: soft. Tender to light palpation in the RLQ and periumbilical area  Ext: no edema in BLE  Musculoskeletal:  No deformities Skin: warm and dry  Neuro:  no focal abnormalities noted Psych:  Normal affect    EKG:  The EKG was personally reviewed and demonstrates:  No EKG this admission  Telemetry:  Telemetry was personally reviewed and demonstrates:  sinus tachycardia    Relevant CV Studies: Cardiac Studies & Procedures Objective  ______________________________________________________________________________________________ CARDIAC CATHETERIZATION   CARDIAC CATHETERIZATION 08/10/2019   Conclusion LM: Normal LAD: Ostial 95% stenosis Ramus: Ostial/prox 30% stenosis LCx: Ostial 75% stenosis RCA: Prox-mid diffuse 40% calcific disease Distal RCA 70% ISR   LVEDP 17 mmHg   Findings Coronary Findings Diagnostic  Dominance: Right   Left Main Vessel is normal in caliber. Vessel is angiographically normal.   Left Anterior Descending Vessel is angiographically normal. Ost LAD lesion is 95% stenosed.   Ramus Intermedius Vessel is normal in caliber. Ramus lesion is 30% stenosed.   Left Circumflex Vessel is normal in caliber. Ost Cx lesion is 75% stenosed.   Right Coronary Artery Vessel is normal in caliber. Prox RCA to Mid RCA lesion is 40% stenosed. The lesion is moderately calcified. Dist RCA lesion is 70% stenosed. The lesion was previously treated using a drug eluting stent over 2 years ago.   Intervention   No interventions have been documented.     STRESS TESTS   PCV MYOCARDIAL PERFUSION WITH LEXISCAN  01/19/2022   Narrative Lexiscan  Tetrofosmin  stress test 01/19/2022: 1 Day Rest/Stress protocol. Stress EKG is non-diagnostic as target heart rate not achieved, as this is pharmacological stress test using Lexiscan . Hypertensive response at rest (BP 200/100 mmHG) Normal myocardial perfusion  without obvious evidence of reversible myocardial ischemia or prior infarct. Left ventricular wall thickness is preserved with mild septal hypokinesis likely from mechanical effects of post-operative sternum. Calculated LVEF 54%. Prior study 01/20/2021: Per report normal myocardial perfusion, stress LVEF 49%, visually appears normal. Low risk study.   ECHOCARDIOGRAM   PCV ECHOCARDIOGRAM COMPLETE 05/12/2021   Narrative Echocardiogram 05/12/2021: Poor echo window. Wall motion with reduced sensitivity. Left ventricle cavity is normal in size. Mild concentric remodeling of the left ventricle. Normal global wall motion. Abnormal septal wall motion due to post-operative coronary artery bypass graft. Doppler evidence of grade I (impaired) diastolic dysfunction, normal LAP. Normal LV systolic function with visual EF 50-55%. Calculated EF 56%. No significant change from 07/22/2020.   TEE   ECHO INTRAOPERATIVE TEE 08/15/2019   Narrative *INTRAOPERATIVE TRANSESOPHAGEAL REPORT *       Patient Name:   FHER WYNDHAM Date of Exam: 08/15/2019 Medical Rec #:  [id number]       Height:       69.0 in Accession #:  [id number]      Weight:       220.9 lb Date of Birth:  07/11/1945      BSA:          2.16 m Patient Age:    73 years        BP:           148/68 mmHg Patient Gender: M               HR:           110 bpm. Exam Location:  Anesthesiology   Transesophogeal exam was perform intraoperatively during surgical procedure. Patient was closely monitored under general anesthesia during the entirety of examination.   Indications:     CAD Sonographer:     Joleen Navy RDCS (AE) Performing Phys: [id number] Z ATKINS Diagnosing Phys: Jake Mayers MD   Complications: No known complications during this procedure. POST-OP IMPRESSIONS - Left Ventricle: has moderately reduced systolic function. The cavity size was severely dilated. The wall motion is abnormal with regional variation. The  EF is 30-35% with global hypokinesis. There is akinesis of the anterior wall. - Aorta: The aorta appears unchanged from pre-bypass. - Aortic Valve: The aortic valve appears unchanged from pre-bypass. - Mitral Valve: The mitral valve appears unchanged from pre-bypass. - Tricuspid Valve: The tricuspid valve appears unchanged from pre-bypass.   PRE-OP FINDINGS Left Ventricle: The left ventricle has mildly reduced systolic function, with an ejection fraction of 45-50%. The cavity size was moderately dilated. There is moderately increased left ventricular wall thickness.   Right Ventricle: The right ventricle has normal systolic function. The cavity was normal. There is no increase in right ventricular wall thickness.   Left Atrium: Left atrial size was not assessed. The left atrial appendage is well visualized and there is no evidence of thrombus present.   Right Atrium: Right atrial size was not assessed.   Interatrial Septum: No atrial level shunt detected by color flow Doppler.   Pericardium: There is no evidence of pericardial effusion.   Mitral Valve: The mitral valve is normal in structure. No thickening of the mitral valve leaflet. No calcification of the mitral valve leaflet. Mitral valve regurgitation is not visualized by color flow Doppler.   Tricuspid Valve: The tricuspid valve was normal in structure. Tricuspid valve regurgitation was not visualized by color flow Doppler.   Aortic Valve: The aortic valve is normal in structure. Aortic valve regurgitation was not visualized by color flow Doppler. There is no evidence of aortic valve stenosis.   Pulmonic Valve: The pulmonic valve was normal in structure. Pulmonic valve regurgitation is not visualized by color flow Doppler.     +--------------+-------++ LEFT VENTRICLE        +--------------+-------++ PLAX 2D               +--------------+-------++ LVIDd:        5.10 cm +--------------+-------++ LVIDs:         3.80 cm +--------------+-------++ LV SV:        62 ml   +--------------+-------++ LV SV Index:  27.60   +--------------+-------++                       +--------------+-------++     Jake Mayers MD Electronically signed by Jake Mayers MD Signature Date/Time: 08/15/2019/2:49:29 PM       Final           ______________________________________________________________________________________________  Laboratory Data:   High Sensitivity Troponin:    Last Labs No results for input(s): "TROPONINIHS" in the last 720 hours.    Chemistry  Last Labs Recent Labs Lab 08/30/23 1727 08/31/23 0416 NA 138 141 K 3.4* 3.0* CL 99 98 CO2 24 25 GLUCOSE 74 79 BUN 86* 90* CREATININE 3.55* 3.88* CALCIUM  9.5 9.3 GFRNONAA 17* 15* ANIONGAP 15 18*     Last Labs Recent Labs Lab 08/30/23 1727 08/31/23 0416 PROT 7.5 7.1 ALBUMIN  3.2* 2.9* AST 24 21 ALT 22 19 ALKPHOS 66 67 BILITOT 0.7 0.9    Lipids   Last Labs No results for input(s): "CHOL", "TRIG", "HDL", "LABVLDL", "LDLCALC", "CHOLHDL" in the last 168 hours.   Hematology  Last Labs Recent Labs Lab 08/30/23 1727 08/31/23 0533 WBC 17.0* 17.0* RBC 4.21* 4.13* HGB 14.2 14.2 HCT 44.0 43.1 MCV 104.5* 104.4* MCH 33.7 34.4* MCHC 32.3 32.9 RDW 14.1 14.3 PLT 220 209    Thyroid    Last Labs No results for input(s): "TSH", "FREET4" in the last 168 hours.   BNP  Last Labs No results for input(s): "BNP", "PROBNP" in the last 168 hours.   DDimer   Last Labs No results for input(s): "DDIMER" in the last 168 hours.      Radiology/Studies:  CT ABDOMEN PELVIS WO CONTRAST Result Date: 08/30/2023 CLINICAL DATA:  RLQ abdominal pain EXAM: CT ABDOMEN AND PELVIS WITHOUT CONTRAST TECHNIQUE: Multidetector CT imaging of the abdomen and pelvis was performed following the standard protocol without IV contrast. RADIATION DOSE REDUCTION: This exam was performed according to the  departmental dose-optimization program which includes automated exposure control, adjustment of the mA and/or kV according to patient size and/or use of iterative reconstruction technique. COMPARISON:  September 28, 2019 FINDINGS: Of note, the lack of intravenous contrast limits evaluation of the solid organ parenchyma and vascularity. Lower chest: Trace right and small left pleural effusions. Rounded airspace consolidation in the left lower lobe, likely round atelectasis. Right coronary atherosclerosis. Hepatobiliary: No mass. No radiopaque stones or wall thickening of the gallbladder. No intrahepatic or extrahepatic biliary ductal dilation. Pancreas: No mass or main ductal dilation. No peripancreatic inflammation or fluid collection. Spleen: Normal size. No mass. Adrenals/Urinary Tract: Unchanged diffuse nodular thickening of both adrenal glands. Unchanged small bilateral renal cysts. No renal mass. No hydronephrosis or nephrolithiasis. Circumferential wall thickening of the urinary bladder. Stomach/Bowel: The stomach is decompressed without focal abnormality. No small bowel wall thickening or inflammation. No small bowel obstruction.Distended, inflamed appendix with wall thickening. The appendiceal tip is most notably enlarged measuring 2 cm. A couple of small gas locules noted along the tip (axial 64). Small, mixed density, ovoid structure along the sigmoid mesocolon (axial 65, sagittal 110), measuring 17 x 23 x 14 mm. Descending and sigmoid colonic diverticulosis. Mild wall thickening of the sigmoid colon, likely reactive. Vascular/Lymphatic: No aortic aneurysm. Diffuse aortoiliac atherosclerosis. No intraabdominal or pelvic lymphadenopathy. Reproductive: No prostatomegaly. No free pelvic fluid. Other: No pneumoperitoneum, ascites, or mesenteric inflammation. Musculoskeletal: No acute fracture or destructive lesion. Multilevel degenerative disc disease of the spine. Osteopenia. Screw and plate fixation of the lower  sternum. IMPRESSION: 1. Acute appendicitis. Apparent irregularity of the appendiceal tip with a couple of small locules of gas. While these locules of gas may be intraluminal, a small periappendiceal abscess containing locules of gas could also have this appearance and would be worrisome for perforation. 2. Small, mixed density, ovoid structure along the sigmoid mesocolon (axial 65, sagittal 110), measuring 17 x 23 x 14  mm, worrisome for a small periappendiceal abscess. 3. Descending and sigmoid colonic diverticulosis. Mild wall thickening of the sigmoid colon, is likely reactive. 4. Small left pleural effusion with a trace right pleural effusion. Rounded airspace consolidation in the left lower lobe, likely round atelectasis. Electronically Signed   By: Rance Burrows M.D.   On: 08/30/2023 22:23         Assessment and Plan:   Preoperative Evaluation  CAD s/p CABG - Patient previously had CABG x4 in 07/2019. Post op course complicated by cardiogenic shock, cardiac arrest requiring internal CPR in the ICU after opening the chest, atrial fibrillation requiring cardioversion, AKI needing dialysis, right brain infarct with left hemiparesis, multiple blood transfusions  - Most recent echocardiogram from 04/2021 showed EF 50-55%, abnormal septal wall motion due to post op CABG - Most recent nuclear stress test from 12/2021 showed normal myocardial perfusion without obvious evidence of reversible myocardial ischemia or prior infarct  - In addition to patient's cardiac history, he also has CKD stage IV - He reports being stable from a cardiac perspective. No exertional chest pain. Able to complete at least 4 METS physical activity (climb stairs, housework, grocery shopping). No evidence of hypervolemia on exam  - Discussed with patient that given his past cardiac history and comorbitidities, he is at an increased risk for perioperative complications. However, this risk is not prohibitive. He is optimized from a  cardiac standpoint.  - Needs an EKG prior to surgery, order has been placed. I do not anticipate any further cardiac workup will be needed prior to surgery  - MD to see and finalize preop recs       Reproductive/Obstetrics                              Anesthesia Physical Anesthesia Plan  ASA: 4  Anesthesia Plan: General   Post-op Pain Management:    Induction: Intravenous, Rapid sequence and Cricoid pressure planned  PONV Risk Score and Plan: 3 and Ondansetron  and Dexamethasone   Airway Management Planned: Oral ETT  Additional Equipment: ClearSight  Intra-op Plan:   Post-operative Plan: Possible Post-op intubation/ventilation  Informed Consent: I have reviewed the patients History and Physical, chart, labs and discussed the procedure including the risks, benefits and alternatives for the proposed anesthesia with the patient or authorized representative who has indicated his/her understanding and acceptance.     Dental advisory given  Plan Discussed with: CRNA  Anesthesia Plan Comments:          Anesthesia Quick Evaluation

## 2023-08-31 NOTE — Telephone Encounter (Signed)
 Copied from CRM [id number]. Topic: General - Other >> Aug 31, 2023 10:16 AM Deaijah H wrote: Reason for CRM: Patients wife called in to let Dr. Nicolette Barrio know that they went to the hospital but they Kept patient because his appendix is inflamed not sure if they want to do surgery but is treating for pain with antibiotics.

## 2023-08-31 NOTE — Discharge Instructions (Addendum)
 [facility]  LAPAROSCOPIC SURGERY: POST OP INSTRUCTIONS Always review your discharge instruction sheet given to you by the facility where your surgery was performed. IF YOU HAVE DISABILITY OR FAMILY LEAVE FORMS, YOU MUST BRING THEM TO THE OFFICE FOR PROCESSING.   DO NOT GIVE THEM TO YOUR DOCTOR.  PAIN CONTROL  Pain regimen: take over-the-counter tylenol  (acetaminophen ) 1000mg  every six hours, the prescription ibuprofen (600mg ) every six hours and the robaxin  (methocarbamol ) 750mg  every six hours. With all three of these, you should be taking something every two hours. Example: tylenol  (acetaminophen ) at 8am, ibuprofen at 10am, robaxin  (methocarbamol ) at 12pm, tylenol  (acetaminophen ) again at 2pm, ibuprofen again at 4pm, robaxin  (methocarbamol ) at 6pm. You also have a prescription for oxycodone , which should be taken if the tylenol  (acetaminophen ), ibuprofen, and robaxin  (methocarbamol ) are not enough to control your pain. You may take the oxycodone  as frequently as every four hours as needed, but if you are taking the other medications as above, you should not need the oxycodone  this frequently. You have also been given a prescription for colace (docusate) which is a stool softener. Please take this as prescribed because the oxycodone  can cause constipation and the colace (docusate) will minimize or prevent constipation. Do not drive while taking or under the influence of the oxycodone  as it is a narcotic medication. Use ice packs to help control pain. If you need a refill on your pain medication, please contact your pharmacy.  They will contact our office to request authorization. Prescriptions will not be filled after 5pm or on week-ends.  HOME MEDICATIONS Take your usually prescribed medications unless otherwise directed.  DIET You should follow a light diet the first few days after arrival home.  Be sure to include lots of fluids daily.  Do not consume alcohol while  taking oxycodone  or ibuprofen.   CONSTIPATION It is common to experience some constipation after surgery and if you are taking pain medication.  Increasing fluid intake and taking a stool softener (such as Colace) will usually help or prevent this problem from occurring.  A mild laxative (Miralax , over-the counter) should be taken according to package instructions if there are no bowel movements after 48 hours. If still no bowel movement 24 hours after taking Miralax , you may try magnesium  citrate, available over the counter at a local pharmacy.   WOUND/INCISION CARE Most patients will experience some swelling and bruising in the area of the incisions.  Ice packs will help.  Swelling and bruising can take several days to resolve.  May shower beginning 09/01/2023.  Do not peel off or scrub skin glue. May allow warm soapy water  to run over incision, then rinse and pat dry.  Do not soak in any water  (tubs, hot tubs, pools, lakes, oceans) for one week.   ACTIVITIES You may resume regular (light) daily activities beginning the next day--such as daily self-care, walking, climbing stairs--gradually increasing activities as tolerated.  You may have sexual intercourse when it is comfortable.   No lifting greater than 5 pounds for six weeks.  You may drive when you are no longer taking narcotic pain medication, you can comfortably wear a seatbelt, and you can safely maneuver your car and apply brakes.  FOLLOW-UP You should see your doctor in the office for a follow-up appointment approximately 2-3 weeks after your surgery.  You should have been given your post-op/follow-up appointment when your surgery was scheduled.  If you did not receive a post-op/follow-up appointment, make sure that you  call for this appointment within a day or two after you arrive home to ensure a convenient appointment time.  WHEN TO CALL YOUR DOCTOR: Fever over 101.5 Inability to urinate Continued bleeding from incision. Increased  pain, redness, or drainage from the incision. Increasing abdominal pain  The clinic staff is available to answer your questions during regular business hours.  Please don't hesitate to call and ask to speak to one of the nurses for clinical concerns.  If you have a medical emergency, go to the nearest emergency room or call 911.  A surgeon from [facility] is always on call at the hospital. [address], [address], [place], [place]  [address] ? [address] [address], [place], [place]   [address] [phone] ? [phone] ? FAX [phone] Web site: [email or web address]

## 2023-08-31 NOTE — Plan of Care (Signed)
  Problem: Education: Goal: Ability to describe self-care measures that may prevent or decrease complications (Diabetes Survival Skills Education) will improve Outcome: Progressing  Problem: Health Behavior/Discharge Planning: Goal: Ability to identify and utilize available resources and services will improve Outcome: Progressing Goal: Ability to manage health-related needs will improve Outcome: Progressing   Problem: Nutritional: Goal: Maintenance of adequate nutrition will improve Outcome: Progressing Goal: Progress toward achieving an optimal weight will improve Outcome: Progressing   Problem: Skin Integrity: Goal: Risk for impaired skin integrity will decrease Outcome: Progressing   Problem: Tissue Perfusion: Goal: Adequacy of tissue perfusion will improve Outcome: Progressing

## 2023-08-31 NOTE — Consult Note (Signed)
 Christian [name] 11/10/1945  [id number].    Requesting MD: Arminda Landmark, MD Chief Complaint/Reason for Consult: appendicitis  HPI:  [name] is a 78 yo male who presented to the ED with abdominal pain. The pain began about 4 days ago and was initially very severe and across the lower abdomen. It then improved significantly, and again worsened in the last day. The pain is now localized to the right side. He reports nausea and vomiting at home. Labs in the ED are significant for a WBC of 17. A CT scan showed acute appendicitis with signs of perforation but no abscess. General surgery was consulted.   The patient has a history of CKD, CAD with unstable angina and NSTEMI (s/p CAB on 08/15/19). His postop course was complicated by cardiogenic shock and arrest, and was on dialysis during his hospitalization. He is on Plavix , which he last took yesterday morning. He had a Lexiscan  in September 2023 that showed an LVEF of 54%.   ROS: Review of Systems  Constitutional:  Negative for chills and fever.  Respiratory:  Positive for shortness of breath.        Endorses dyspnea on exertion    Family History  Problem Relation Age of Onset   Diabetes Mother    Hypertension Mother    Diabetes Father    Diabetes Brother    Hypertension Brother    Bone cancer Brother    Diabetes Brother     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   Diabetic polyneuropathy    Dry eyes left   Elevated liver enzymes 08/09/2019   Hiatal hernia    History of coronary artery stent placement    History of pulmonary embolism 08/09/2019   HTN (hypertension) 11/12/2009   Hyperlipidemia associated with type 2 diabetes  mellitus 11/12/2009   Incomplete bladder emptying    Ischemic cardiomyopathy    Left leg weakness 11/23/2018   Mild dementia, 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 LIMA TO LAD, RIMA TO PDA, RADIAL ARTERY TO CIRC AND SEQUENTIALLY TO OM1.   Shortness of breath 11/12/2009   Sleep difficulties 05/30/2020   Swelling of lower extremity    Type II diabetes mellitus 10/10/2014    Past Surgical History:  Procedure Laterality Date   A/V FISTULAGRAM Left 08/12/2022   Procedure: A/V Fistulagram;  Surgeon: Kayla Part, MD;  [facility]: [facility] INVASIVE CV LAB;  Service: Cardiovascular;  Laterality: Left;   APPLICATION OF WOUND VAC N/A 08/24/2019   Procedure: APPLICATION OF WOUND VAC;  Surgeon: Rudine Cos, MD;  Location: [facility] OR;  Service: Thoracic;  Laterality: N/A;   APPLICATION OF WOUND VAC  08/29/2019   Procedure: Wound Vac change;  Surgeon: Neila Bally,  Vernette Goo, MD;  Location: [facility] OR;  Service: Open Heart Surgery;;   APPLICATION OF WOUND VAC N/A 09/04/2019   Procedure: Application Of Wound Vac;  Surgeon: Norita Beauvais, MD;  Location: [facility] OR;  Service: Open Heart Surgery;  Laterality: N/A;   APPLICATION OF WOUND VAC N/A 09/06/2019   Procedure: APPLICATION OF ACELL, APPLICATION OF WOUND VAC USING PREVENA INCISIONAL  DRESSING;  Surgeon: Thornell Flirt, DO;  Location: [facility] OR;  Service: Plastics;  Laterality: N/A;   AV FISTULA PLACEMENT Left 09/18/2020   Procedure: ARTERIOVENOUS (AV) FISTULA CREATION LEFT VERSUS GRAFT;  Surgeon: Margherita Shell, MD;  Location: [facility] OR;  Service:  Vascular;  Laterality: Left;   BASCILIC VEIN TRANSPOSITION Left 10/31/2020   Procedure: LEFT SECOND STAGE BASCILIC VEIN TRANSPOSITION;  Surgeon: Margherita Shell, MD;  [facility]: [facility];  Service: Vascular;  Laterality: Left;   CARDIAC CATHETERIZATION     CORONARY ARTERY BYPASS GRAFT N/A 08/15/2019   Procedure: CORONARY ARTERY BYPASS GRAFTING (CABG), x 4 using bilateral IMAs and left radial artery .  LIMA TO LAD, RIMA TO PDA, RADIAL ARTERY TO CIRC AND SEQUENTIALLY TO OM1.;  Surgeon: Rudine Cos, MD;  Location: [facility] OR;  Service: Open Heart Surgery;  Laterality: N/A;   CORONARY STENT PLACEMENT  02/27/2014   distal rt/pd coronary       dr Berry Bristol   CYSTO/ BLADDER BIOPSY'S/ CAUTHERIZATION  01-14-2004  DR Isla Mari   EXPLORATION POST OPERATIVE OPEN HEART N/A 08/16/2019   Procedure: Chest Closure S?P CABG WITH APPLICATION OF PREVENA  INCISIONAL WOUND VAC;  Surgeon: Rudine Cos, MD;  [facility]: [facility];  Service: Open Heart Surgery;  Laterality: N/A;   EXPLORATION POST OPERATIVE OPEN HEART N/A 08/21/2019   Procedure: CHEST WASHOUT S/P OPEN CHEST;  Surgeon: Rudine Cos, MD;  [facility];  Service: Open Heart Surgery;  Laterality: N/A;  Open chest with Esmark dressing with Ioban sealant coverage.   EXPLORATION POST OPERATIVE OPEN HEART N/A 08/18/2019   Procedure: EXPLORATION POST OPERATIVE OPEN HEART (performed 04/23 on unit);  Surgeon: Rudine Cos, MD;  Location: [facility] OR;  Service: Open Heart Surgery;  Laterality: N/A;   EXPLORATION POST OPERATIVE OPEN HEART N/A 08/24/2019   Procedure: CHEST WASHOUT POST OPERATIVE OPEN HEART;  Surgeon: Rudine Cos, MD;  [facility];  Service: Open Heart Surgery;  Laterality: N/A;   EXPLORATION POST OPERATIVE OPEN HEART N/A 08/29/2019   Procedure: CHEST WOUND WASHOUT POST OPERATIVE OPEN HEART;  Surgeon: Rudine Cos, MD;  [facility];  Service: Open Heart Surgery;  Laterality: N/A;   EXPLORATION POST OPERATIVE OPEN HEART N/A 09/04/2019    Procedure: MEDIASTINAL EXPLORATION WITH STERNAL WOUND IRRIGATION;  Surgeon: Norita Beauvais, MD;  Location: [facility] OR;  Service: Open Heart Surgery;  Laterality: N/A;   EXPLORATION POST OPERATIVE OPEN HEART N/A 09/14/2019   Procedure: EVACUATION OF HEMATOMA;  Surgeon: Norita Beauvais, MD;  Location: [facility] OR;  Service: Open Heart Surgery;  Laterality: N/A;   IR FLUORO GUIDE CV LINE LEFT  09/19/2019   IR GASTROSTOMY TUBE MOD SED  10/04/2019   IR GASTROSTOMY TUBE REMOVAL  11/29/2019   IR PATIENT EVAL TECH 0-60 MINS  09/29/2019   IR US  GUIDE VASC ACCESS LEFT  09/19/2019   LAPAROSCOPIC LYSIS OF ADHESIONS N/A 09/06/2019   Procedure: LAPAROSCOPIC OMENTAL HARVEST;  Surgeon: Derral Flick, MD;  [facility];  Service: General;  Laterality: N/A;   LEFT HEART CATH AND CORONARY ANGIOGRAPHY  N/A 08/10/2019   Procedure: LEFT HEART CATH AND CORONARY ANGIOGRAPHY;  Surgeon: Cody Das, MD;  [facility]: [facility] INVASIVE CV LAB;  Service: Cardiovascular;  Laterality: N/A;   LEFT HEART CATHETERIZATION WITH CORONARY ANGIOGRAM N/A 02/27/2014   Procedure: LEFT HEART CATHETERIZATION WITH CORONARY ANGIOGRAM;  Surgeon: Jessica Morn, MD;  Location: [facility] CATH LAB;  Service: Cardiovascular;  Laterality: N/A;   MEDIASTINAL EXPLORATION N/A 09/06/2019   Procedure: MEDIASTINAL EXPLORATION;  Surgeon: Norita Beauvais, MD;  Location: [facility] OR;  Service: Thoracic;  Laterality: N/A;   PECTORALIS FLAP  09/06/2019   Procedure: Pectoralis ADVANCEMENT Flap;  Surgeon: Thornell Flirt, DO;  [facility] OR;  Service: Plastics;;   PERCUTANEOUS CORONARY STENT INTERVENTION (PCI-S)  02/27/2014   Procedure: PERCUTANEOUS CORONARY STENT INTERVENTION (PCI-S);  Surgeon: Jessica Morn, MD;  Location: [facility] CATH LAB;  Service: Cardiovascular;;  rt PDA  3.0/28mm Promus stent   PERIPHERAL VASCULAR INTERVENTION Left 08/12/2022   Procedure: PERIPHERAL VASCULAR INTERVENTION;  Surgeon: Kayla Part, MD;  Location: [facility] INVASIVE CV LAB;  Service:  Cardiovascular;  Laterality: Left;   RADIAL ARTERY HARVEST Left 08/15/2019   Procedure: Radial Artery Harvest;  Surgeon: Rudine Cos, MD;  Location: [facility] OR;  Service: Open Heart Surgery;  Laterality: Left;   RIB PLATING N/A 09/06/2019   Procedure: STERNAL PLATING;  Surgeon: Norita Beauvais, MD;  Location: [facility] OR;  Service: Thoracic;  Laterality: N/A;   TEE WITHOUT CARDIOVERSION N/A 08/15/2019   Procedure: TRANSESOPHAGEAL ECHOCARDIOGRAM (TEE);  Surgeon: Rudine Cos, MD;  Location: [facility] OR;  Service: Open Heart Surgery;  Laterality: N/A;   TRACHEOSTOMY TUBE PLACEMENT  08/29/2019   Procedure: Tracheostomy;  Surgeon: Rudine Cos, MD;  Location[facility] OR;  Service: Open Heart Surgery; Decannulated 6/10/20211   TRANSURETHRAL RESECTION OF PROSTATE  04/04/2012   Procedure: TRANSURETHRAL RESECTION OF THE PROSTATE WITH GYRUS INSTRUMENTS;  Surgeon: Edmund Gouge, MD;  Location: [facility];  Service: Urology;  Laterality: N/A;   TRANSURETHRAL RESECTION OF PROSTATE N/A 09/27/2014   Procedure: TRANSURETHRAL RESECTION OF THE PROSTATE ;  Surgeon: Annamarie Kid, MD;  Location: [facility] ORS;  Service: Urology;  Laterality: N/A;   UPPER GASTROINTESTINAL ENDOSCOPY      Social History:  reports that he quit smoking about 50 years ago. His smoking use included cigarettes. He started smoking about 70 years ago. He has a 10 pack-year smoking history. He has never used smokeless tobacco. He reports that he does not currently use alcohol. He reports that he does not use drugs.  Allergies: No Known Allergies  (Not in a hospital admission)    Physical Exam: Blood pressure 105/72, pulse 96, temperature 98.4 F (36.9 C), temperature source Oral, resp. rate (!) 22, SpO2 97%. General: resting comfortably, appears stated age, no apparent distress Neurological: alert and oriented, no focal deficits HEENT: normocephalic, atraumatic CV: regular rate and rhythm, extremities warm and  well-perfused Respiratory: normal work of breathing on room air Abdomen: soft, nondistended, focally tender to palpation in the right flank. No surgical scars. Extremities: warm and well-perfused Psychiatric: normal mood and affect Skin: warm and dry   Results for orders placed or performed during the hospital encounter of 08/30/23 (from the past 48 hours)  Lipase, blood     Status: None   Collection Time: 08/30/23  5:27 PM  Result Value Ref Range   Lipase 28 11 - 51 U/L    Comment: Performed at [facility], [address] [place],  [place] [address]  Comprehensive metabolic panel     Status: Abnormal   Collection Time: 08/30/23  5:27 PM  Result Value Ref Range   Sodium 138 135 - 145 mmol/L   Potassium 3.4 (L) 3.5 - 5.1 mmol/L   Chloride 99 98 - 111 mmol/L   CO2 24 22 - 32 mmol/L   Glucose, Bld 74 70 - 99 mg/dL    Comment: Glucose reference range applies only to samples taken after fasting for at least 8 hours.   BUN 86 (H) 8 - 23 mg/dL   Creatinine, Ser 6.04 (H) 0.61 - 1.24 mg/dL   Calcium  9.5 8.9 - 10.3 mg/dL   Total Protein 7.5 6.5 - 8.1 g/dL   Albumin  3.2 (L) 3.5 - 5.0 g/dL   AST 24 15 - 41 U/L   ALT 22 0 - 44 U/L   Alkaline Phosphatase 66 38 - 126 U/L   Total Bilirubin 0.7 0.0 - 1.2 mg/dL   GFR, Estimated 17 (L) >60 mL/min    Comment: (NOTE) Calculated using the CKD-EPI Creatinine Equation (2021)    Anion gap 15 5 - 15    Comment: Performed at [facility], [address] [place], [place] [address]  CBC     Status: Abnormal   Collection Time: 08/30/23  5:27 PM  Result Value Ref Range   WBC 17.0 (H) 4.0 - 10.5 K/uL   RBC 4.21 (L) 4.22 - 5.81 MIL/uL   Hemoglobin 14.2 13.0 - 17.0 g/dL   HCT 11.9 14.7 - 82.9 %   MCV 104.5 (H) 80.0 - 100.0 fL   MCH 33.7 26.0 - 34.0 pg   MCHC 32.3 30.0 - 36.0 g/dL   RDW 56.2 13.0 - 86.5 %   Platelets 220 150 - 400 K/uL   nRBC 0.0 0.0 - 0.2 %    Comment: Performed at [facility], [address], [place],  [place] [address]  Urinalysis, Routine w reflex microscopic -Urine, Clean Catch     Status: Abnormal   Collection Time: 08/30/23 10:24 PM  Result Value Ref Range   Color, Urine YELLOW YELLOW   APPearance HAZY (A) CLEAR   Specific Gravity, Urine 1.009 1.005 - 1.030   pH 5.0 5.0 - 8.0   Glucose, UA NEGATIVE NEGATIVE mg/dL   Hgb urine dipstick NEGATIVE NEGATIVE   Bilirubin Urine NEGATIVE NEGATIVE   Ketones, ur NEGATIVE NEGATIVE mg/dL   Protein, ur NEGATIVE NEGATIVE mg/dL   Nitrite NEGATIVE NEGATIVE   Leukocytes,Ua MODERATE (A) NEGATIVE   RBC / HPF 0-5 0 - 5 RBC/hpf   WBC, UA 21-50 0 - 5 WBC/hpf   Bacteria, UA FEW (A) NONE SEEN   Squamous Epithelial / HPF 0-5 0 - 5 /HPF   Mucus PRESENT    Hyaline Casts, UA PRESENT     Comment: Performed at [facility], [address] [place], [place] [address]   CT ABDOMEN PELVIS WO CONTRAST Result Date: 08/30/2023 CLINICAL DATA:  RLQ abdominal pain EXAM: CT ABDOMEN AND PELVIS WITHOUT CONTRAST TECHNIQUE: Multidetector CT imaging of the abdomen and pelvis was performed following the standard protocol without IV contrast. RADIATION DOSE REDUCTION: This exam was performed according to the departmental dose-optimization program which includes automated exposure control, adjustment of the mA and/or kV according to patient size and/or use of iterative reconstruction technique. COMPARISON:  September 28, 2019 FINDINGS: Of note, the lack of intravenous contrast limits evaluation of the solid organ parenchyma and vascularity. Lower chest: Trace  right and small left pleural effusions. Rounded airspace consolidation in the left lower lobe, likely round atelectasis. Right coronary atherosclerosis. Hepatobiliary: No mass. No radiopaque stones or wall thickening of the gallbladder. No intrahepatic or extrahepatic biliary ductal dilation. Pancreas: No mass or main ductal dilation. No peripancreatic inflammation or fluid collection. Spleen: Normal size. No mass. Adrenals/Urinary Tract:  Unchanged diffuse nodular thickening of both adrenal glands. Unchanged small bilateral renal cysts. No renal mass. No hydronephrosis or nephrolithiasis. Circumferential wall thickening of the urinary bladder. Stomach/Bowel: The stomach is decompressed without focal abnormality. No small bowel wall thickening or inflammation. No small bowel obstruction.Distended, inflamed appendix with wall thickening. The appendiceal tip is most notably enlarged measuring 2 cm. A couple of small gas locules noted along the tip (axial 64). Small, mixed density, ovoid structure along the sigmoid mesocolon (axial 65, sagittal 110), measuring 17 x 23 x 14 mm. Descending and sigmoid colonic diverticulosis. Mild wall thickening of the sigmoid colon, likely reactive. Vascular/Lymphatic: No aortic aneurysm. Diffuse aortoiliac atherosclerosis. No intraabdominal or pelvic lymphadenopathy. Reproductive: No prostatomegaly. No free pelvic fluid. Other: No pneumoperitoneum, ascites, or mesenteric inflammation. Musculoskeletal: No acute fracture or destructive lesion. Multilevel degenerative disc disease of the spine. Osteopenia. Screw and plate fixation of the lower sternum. IMPRESSION: 1. Acute appendicitis. Apparent irregularity of the appendiceal tip with a couple of small locules of gas. While these locules of gas may be intraluminal, a small periappendiceal abscess containing locules of gas could also have this appearance and would be worrisome for perforation. 2. Small, mixed density, ovoid structure along the sigmoid mesocolon (axial 65, sagittal 110), measuring 17 x 23 x 14 mm, worrisome for a small periappendiceal abscess. 3. Descending and sigmoid colonic diverticulosis. Mild wall thickening of the sigmoid colon, is likely reactive. 4. Small left pleural effusion with a trace right pleural effusion. Rounded airspace consolidation in the left lower lobe, likely round atelectasis. Electronically Signed   By: Rance Burrows M.D.   On:  08/30/2023 22:23      Assessment/Plan 78 yo male presenting with acute perforated appendicitis without abscess. I personally reviewed his labs, imaging and notes. His CT scan shows dilation of the appendix with significant surrounding stranding around the tip of the appendix and adjacent tissues, but no discrete abscess. Would favor proceeding with appendectomy as the base of the appendix and cecum appear relatively free of inflammation, however this will need to be considered in light of the patient's medical comorbidities. Will ultimately defer to oncoming surgeon this morning, and will request cardiac preoperative risk stratification. If patient is high risk for cardiac complications, would attempt nonoperative management first with antibiotics alone. I discussed this plan with the patient and his wife, who expressed understanding. All questions were answered. We will follow. Please keep NPO for now except ice chips/sips. Continue IV antibiotics.   Karleen Overall, MD [facility] General, Hepatobiliary and Pancreatic Surgery 08/31/23 4:34 AM

## 2023-08-31 NOTE — Progress Notes (Signed)
 Patient seen and examined. CT scan reviewed, plan lap appy. Informed consent was obtained after detailed explanation of risks, including bleeding, infection, abscess, staple line leak, stump appendicitis, injury to surrounding structures, and need for conversion to open procedure. All questions answered to the patient's satisfaction as well as his wife who I called while in the room.    Anda Bamberg, MD General and Trauma Surgery [facility]

## 2023-08-31 NOTE — Anesthesia Procedure Notes (Signed)
 Procedure Name: Intubation Date/Time: 08/31/2023 3:02 PM  Performed by: Bennett Brass, CRNAPre-anesthesia Checklist: Patient identified, Emergency Drugs available, Suction available and Patient being monitored Patient Re-evaluated:Patient Re-evaluated prior to induction Oxygen Delivery Method: Circle system utilized Preoxygenation: Pre-oxygenation with 100% oxygen Induction Type: IV induction Ventilation: Mask ventilation without difficulty Laryngoscope Size: Miller and 2 Grade View: Grade I Tube type: Oral Tube size: 7.0 mm Number of attempts: 1 Airway Equipment and Method: Stylet and Oral airway Placement Confirmation: ETT inserted through vocal cords under direct vision, positive ETCO2 and breath sounds checked- equal and bilateral Secured at: 22 cm Tube secured with: Tape Dental Injury: Teeth and Oropharynx as per pre-operative assessment

## 2023-09-01 ENCOUNTER — Encounter (HOSPITAL_COMMUNITY): Payer: Self-pay | Admitting: Surgery

## 2023-09-01 ENCOUNTER — Other Ambulatory Visit (HOSPITAL_COMMUNITY): Payer: Self-pay

## 2023-09-01 DIAGNOSIS — K37 Unspecified appendicitis: Secondary | ICD-10-CM | POA: Diagnosis not present

## 2023-09-01 DIAGNOSIS — I159 Secondary hypertension, unspecified: Secondary | ICD-10-CM

## 2023-09-01 DIAGNOSIS — N184 Chronic kidney disease, stage 4 (severe): Secondary | ICD-10-CM | POA: Diagnosis not present

## 2023-09-01 DIAGNOSIS — N179 Acute kidney failure, unspecified: Secondary | ICD-10-CM | POA: Diagnosis not present

## 2023-09-01 LAB — CBC
HCT: 35.2 % — ABNORMAL LOW (ref 39.0–52.0)
HCT: 32.9 % — ABNORMAL LOW (ref 39.0–52.0)
HCT: 32.1 % — ABNORMAL LOW (ref 39.0–52.0)
Hemoglobin: 11.6 g/dL — ABNORMAL LOW (ref 13.0–17.0)
Hemoglobin: 11 g/dL — ABNORMAL LOW (ref 13.0–17.0)
Hemoglobin: 10.2 g/dL — ABNORMAL LOW (ref 13.0–17.0)
MCH: 34.6 pg — ABNORMAL HIGH (ref 26.0–34.0)
MCH: 34.6 pg — ABNORMAL HIGH (ref 26.0–34.0)
MCH: 34.6 pg — ABNORMAL HIGH (ref 26.0–34.0)
MCHC: 33 g/dL (ref 30.0–36.0)
MCHC: 33.4 g/dL (ref 30.0–36.0)
MCHC: 31.8 g/dL (ref 30.0–36.0)
MCV: 105.1 fL — ABNORMAL HIGH (ref 80.0–100.0)
MCV: 103.5 fL — ABNORMAL HIGH (ref 80.0–100.0)
MCV: 108.8 fL — ABNORMAL HIGH (ref 80.0–100.0)
Platelets: 249 10*3/uL (ref 150–400)
Platelets: 251 10*3/uL (ref 150–400)
Platelets: 219 10*3/uL (ref 150–400)
RBC: 3.35 MIL/uL — ABNORMAL LOW (ref 4.22–5.81)
RBC: 3.18 MIL/uL — ABNORMAL LOW (ref 4.22–5.81)
RBC: 2.95 MIL/uL — ABNORMAL LOW (ref 4.22–5.81)
RDW: 14.1 % (ref 11.5–15.5)
RDW: 14.1 % (ref 11.5–15.5)
RDW: 14 % (ref 11.5–15.5)
WBC: 17.6 10*3/uL — ABNORMAL HIGH (ref 4.0–10.5)
WBC: 18.3 10*3/uL — ABNORMAL HIGH (ref 4.0–10.5)
WBC: 16 10*3/uL — ABNORMAL HIGH (ref 4.0–10.5)
nRBC: 0 % (ref 0.0–0.2)
nRBC: 0.1 % (ref 0.0–0.2)
nRBC: 0 % (ref 0.0–0.2)

## 2023-09-01 LAB — COMPREHENSIVE METABOLIC PANEL WITH GFR
ALT: 17 U/L (ref 0–44)
AST: 17 U/L (ref 15–41)
Albumin: 2.7 g/dL — ABNORMAL LOW (ref 3.5–5.0)
Alkaline Phosphatase: 55 U/L (ref 38–126)
Anion gap: 20 — ABNORMAL HIGH (ref 5–15)
BUN: 105 mg/dL — ABNORMAL HIGH (ref 8–23)
CO2: 19 mmol/L — ABNORMAL LOW (ref 22–32)
Calcium: 8.6 mg/dL — ABNORMAL LOW (ref 8.9–10.3)
Chloride: 99 mmol/L (ref 98–111)
Creatinine, Ser: 4.7 mg/dL — ABNORMAL HIGH (ref 0.61–1.24)
GFR, Estimated: 12 mL/min — ABNORMAL LOW (ref >60)
Glucose, Bld: 224 mg/dL — ABNORMAL HIGH (ref 70–99)
Potassium: 4 mmol/L (ref 3.5–5.1)
Sodium: 138 mmol/L (ref 135–145)
Total Bilirubin: 1 mg/dL (ref 0.0–1.2)
Total Protein: 6.7 g/dL (ref 6.5–8.1)

## 2023-09-01 LAB — GLUCOSE, CAPILLARY
Glucose-Capillary: 208 mg/dL — ABNORMAL HIGH (ref 70–99)
Glucose-Capillary: 209 mg/dL — ABNORMAL HIGH (ref 70–99)
Glucose-Capillary: 181 mg/dL — ABNORMAL HIGH (ref 70–99)
Glucose-Capillary: 185 mg/dL — ABNORMAL HIGH (ref 70–99)
Glucose-Capillary: 225 mg/dL — ABNORMAL HIGH (ref 70–99)
Glucose-Capillary: 208 mg/dL — ABNORMAL HIGH (ref 70–99)
Glucose-Capillary: 227 mg/dL — ABNORMAL HIGH (ref 70–99)

## 2023-09-01 LAB — LACTIC ACID, PLASMA
Lactic Acid, Venous: 2.3 mmol/L (ref 0.5–1.9)
Lactic Acid, Venous: 2.9 mmol/L (ref 0.5–1.9)

## 2023-09-01 MED ORDER — LACTATED RINGERS IV BOLUS
500.0000 mL | Freq: Once | INTRAVENOUS | Status: AC
  Administered 2023-09-01: 500 mL via INTRAVENOUS

## 2023-09-01 MED ORDER — SODIUM CHLORIDE 0.9 % IV BOLUS
500.0000 mL | Freq: Once | INTRAVENOUS | Status: AC
  Administered 2023-09-01: 500 mL via INTRAVENOUS

## 2023-09-01 MED ORDER — LACTATED RINGERS IV SOLN
INTRAVENOUS | Status: DC
  Filled 2023-09-01: qty 1000

## 2023-09-01 MED ORDER — HEPARIN SODIUM (PORCINE) 5000 UNIT/ML IJ SOLN
5000.0000 [IU] | Freq: Three times a day (TID) | INTRAMUSCULAR | Status: DC
  Administered 2023-09-01 (×2): 5000 [IU] via SUBCUTANEOUS
  Filled 2023-09-01 (×2): qty 1

## 2023-09-01 NOTE — Hospital Course (Addendum)
 Mr. Emens is a 78 year old with history of CAD status post CABG with postoperative CVA and renal failure necessitating brief hemodialysis who presented to the emergency department with complaints of right lower quadrant abdominal pain.  Imaging confirmed acute appendicitis.  General surgery was consulted and cardiology was consulted to assist with preoperative cardiac clearance.   He underwent laparoscopic appendectomy on 08/31/2023.  Appendicitis with possible periappendiceal abscess Post op ileus - s/p lap appy 5/6 - continue zosyn  x 5 days post op (or as per surgery if further modification) - drain remains in place with bloody fluid noted - some drop in Hgb; likely multifactorial of hemodilution but also had plavix  prior to surgery so suspect some component of blood loss and stabilizing - CTA A/P obtained 5/8 and negative for bleeding and appears to be postop changes otherwise - see below for ABLA - still poor intake with liquids; concern that nutrition may need to be addressed soon if persists  ABLA - As noted above, possibly component of hemodilution with some blood loss with recent surgery and postop with oozing into bulb -CT angio was negative for active bleeding -Continue trending - Hgb 10.4 g/dL this morning; seems to be remaining stable   Acute on CKDIV ATN - patient has history of CKD4. Baseline creat ~ 3.2 - 3.5, eGFR~ 17 - he has required HD the past - possibly volume depletion from surgery/blood loss and when he had severe hypoTN on 5/7; I don't think due to zosyn  but AIN always a possibility as well  - seems to be ATN with uptrending creat/BUN but no acute signs warranting HD at this time; is starting to develop oliguria however (no retention on bladder scans); still no other signs yet to warrant HD - nephrology aware and following for if/when needs HD  HTN - BP more stable now - hold home meds  DMII - Continue SSI and CBG monitoring  CAD - s/p CABG - cleared by  cardiology prior to surgery

## 2023-09-01 NOTE — Progress Notes (Signed)
 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  Imaging confirmed acute appendicitis.  General surgery was consulted and cardiology was consulted to assist with preoperative cardiac clearance.   He underwent laparoscopic appendectomy on 08/31/2023.  Appendicitis with possible periappendiceal abscess - s/p lap appy 5/6 - continue zosyn - drain remains in place with bloody fluid noted - some drop in Hgb; likely multifactorial of hemodilution but also had plavix  prior to surgery so suspect some component of blood loss and stabilizing - continue trending H/H  ABLA - As noted above, possibly component of hemodilution with some blood loss with recent surgery and postop with oozing into bulb - Hemoglobin 11.6 g/dL this morning - Continue trending.  Anticipate more drop with fluids starting today  HTN - BP low/soft currently - hold home meds  Acute on CKDIV - patient has history of CKD4. Baseline creat ~ 3.2 - 3.5, eGFR~ 17 - possibly volume depletion from surgery/blood loss - creat uptrend to 4.7 today - started on IVF 5/7; will monitor response  DMII - Continue SSI and CBG monitoring  CAD - s/p CABG - cleared by cardiology prior to surgery  Interval History:  Wife present this morning.  Patient still having abdominal pain when seen.  Endorses some nausea but has not vomited.   Old records reviewed in assessment of this patient  Antimicrobials: Zosyn 08/30/2023 >> current  DVT prophylaxis:  heparin  injection 5,000 Units Start: 09/01/23 0945 SCDs Start: 08/31/23 0415   Code Status:   Code Status:  Full Code  Mobility Assessment (Last 72 Hours)     Mobility Assessment     Row Name 09/01/23 0954 09/01/23 0200 08/31/23 2100 08/31/23 0800     Does patient have an order for bedrest or is patient medically unstable No - Continue assessment No - Continue assessment No - Continue assessment No - Continue assessment    What is the highest level of mobility based on the progressive mobility assessment? Level 3 (Stands with assist) - Balance while standing  and cannot march in place Level 3 (Stands with assist) - Balance while standing  and cannot march in place Level 3 (Stands with assist) - Balance while standing  and cannot march in place Level 1 (Bedfast) - Unable to balance while sitting on edge of bed    Is the above level different from baseline mobility prior to current illness? Yes - Recommend PT order Yes - Recommend PT order Yes - Recommend PT order Yes - Recommend PT order             Barriers to discharge: None Disposition Plan: Home HH orders placed:  Status is: Inpatient  Objective: Blood pressure 105/74, pulse (!) 114, temperature 97.9 F (36.6 C), temperature source Oral, resp. rate 17, height 5\' 9"  (1.753 m), weight 101.6 kg, SpO2 100%.  Examination:  Physical Exam Constitutional:      General: He is not in acute distress.    Appearance: Normal appearance. He is obese.  HENT:     Head: Normocephalic and atraumatic.     Mouth/Throat:     Mouth: Mucous membranes are  moist.  Eyes:     Extraocular Movements: Extraocular movements intact.  Cardiovascular:     Rate and Rhythm: Normal rate and regular rhythm.  Pulmonary:     Effort: Pulmonary effort is normal. No respiratory distress.     Breath sounds: Normal breath sounds. No wheezing.  Abdominal:     General: Abdomen is protuberant. Bowel sounds are decreased. There is no distension.     Palpations: Abdomen is soft.     Tenderness: There is abdominal tenderness.     Comments: Surgical drain in place with  serosanguineous fluid noted  Musculoskeletal:        General: Normal range of motion.     Cervical back: Normal range of motion and neck supple.  Skin:    General: Skin is warm and dry.  Neurological:     General: No focal deficit present.     Mental Status: He is alert.  Psychiatric:        Mood and Affect: Mood normal.        Behavior: Behavior normal.      Consultants:  General surgery  Procedures:  08/31/2023: Laparoscopic appendectomy  Data Reviewed: Results for orders placed or performed during the hospital encounter of 08/30/23 (from the past 24 hours)  Glucose, capillary     Status: Abnormal   Collection Time: 08/31/23  4:22 PM  Result Value Ref Range   Glucose-Capillary 113 (H) 70 - 99 mg/dL  Glucose, capillary     Status: Abnormal   Collection Time: 08/31/23  8:24 PM  Result Value Ref Range   Glucose-Capillary 176 (H) 70 - 99 mg/dL  Glucose, capillary     Status: Abnormal   Collection Time: 09/01/23  1:03 AM  Result Value Ref Range   Glucose-Capillary 208 (H) 70 - 99 mg/dL  Glucose, capillary     Status: Abnormal   Collection Time: 09/01/23  6:09 AM  Result Value Ref Range   Glucose-Capillary 209 (H) 70 - 99 mg/dL  Comprehensive metabolic panel with GFR     Status: Abnormal   Collection Time: 09/01/23  6:17 AM  Result Value Ref Range   Sodium 138 135 - 145 mmol/L   Potassium 4.0 3.5 - 5.1 mmol/L   Chloride 99 98 - 111 mmol/L   CO2 19 (L) 22 - 32 mmol/L   Glucose, Bld 224 (H) 70 - 99 mg/dL   BUN 409 (H) 8 - 23 mg/dL   Creatinine, Ser 8.11 (H) 0.61 - 1.24 mg/dL   Calcium  8.6 (L) 8.9 - 10.3 mg/dL   Total Protein 6.7 6.5 - 8.1 g/dL   Albumin  2.7 (L) 3.5 - 5.0 g/dL   AST 17 15 - 41 U/L   ALT 17 0 - 44 U/L   Alkaline Phosphatase 55 38 - 126 U/L   Total Bilirubin 1.0 0.0 - 1.2 mg/dL   GFR, Estimated 12 (L) >60 mL/min   Anion gap 20 (H) 5 - 15  CBC     Status: Abnormal   Collection Time: 09/01/23  6:17 AM  Result Value Ref Range   WBC 17.6 (H) 4.0 - 10.5  K/uL   RBC 3.35 (L) 4.22 - 5.81 MIL/uL   Hemoglobin 11.6 (L) 13.0 - 17.0 g/dL   HCT 91.4 (L) 78.2 - 95.6 %   MCV 105.1 (H) 80.0 - 100.0 fL   MCH 34.6 (H) 26.0 - 34.0 pg   MCHC 33.0 30.0 - 36.0 g/dL   RDW 21.3 08.6 - 57.8 %  Platelets 249 150 - 400 K/uL   nRBC 0.0 0.0 - 0.2 %  Glucose, capillary     Status: Abnormal   Collection Time: 09/01/23  7:40 AM  Result Value Ref Range   Glucose-Capillary 181 (H) 70 - 99 mg/dL  Glucose, capillary     Status: Abnormal   Collection Time: 09/01/23 12:15 PM  Result Value Ref Range   Glucose-Capillary 185 (H) 70 - 99 mg/dL    I have reviewed pertinent nursing notes, vitals, labs, and images as necessary. I have ordered labwork to follow up on as indicated.  I have reviewed the last notes from staff over past 24 hours. I have discussed patient's care plan and test results with nursing staff, CM/SW, and other staff as appropriate.  Time spent: Greater than 50% of the 55 minute visit was spent in counseling/coordination of care for the patient as laid out in the A&P.   LOS: 1 day   Faith Homes, MD [facility] 09/01/2023, 2:38 PM

## 2023-09-01 NOTE — Plan of Care (Signed)

## 2023-09-01 NOTE — Progress Notes (Addendum)
 MEWS Progress Note  Patient Details Name: THOAMS KERIN MRN: [id number] DOB: 1945/12/08 Today's Date: 09/01/2023   MEWS Flowsheet Documentation:  Assess: MEWS Score Temp: (!) 97.5 F (36.4 C) BP: (!) 88/55 MAP (mmHg): 67 Pulse Rate: 97 ECG Heart Rate: (!) 102 Resp: 18 Level of Consciousness: New agitation confusion SpO2: 99 % O2 Device: Room Air Patient Activity (if Appropriate): In bed O2 Flow Rate (L/min): 5 L/min Assess: MEWS Score MEWS Temp: 0 MEWS Systolic: 1 MEWS Pulse: 0 MEWS RR: 0 MEWS LOC: 1 MEWS Score: 2 MEWS Score Color: Yellow Assess: SIRS CRITERIA SIRS Temperature : 0 SIRS Respirations : 0 SIRS Pulse: 1 SIRS WBC: 0 SIRS Score Sum : 1 SIRS Temperature : 0 SIRS Pulse: 1 SIRS Respirations : 0 SIRS WBC: 1 SIRS Score Sum : 2 Assess: if the MEWS score is Yellow or Red Were vital signs accurate and taken at a resting state?: Yes Does the patient meet 2 or more of the SIRS criteria?: Yes Does the patient have a confirmed or suspected source of infection?: Yes MEWS guidelines implemented : Yes, red Treat MEWS Interventions: Considered administering scheduled or prn medications/treatments as ordered Take Vital Signs Increase Vital Sign Frequency : Red: Q1hr x2, continue Q4hrs until patient remains green for 12hrs Escalate MEWS: Escalate: Red: Discuss with charge nurse and notify provider. Consider notifying RRT. If remains red for 2 hours consider need for higher level of care Notify: Charge Nurse/RN Name of Charge Nurse/RN Notified: [facility] Provider Notification Provider Name/Title: Dr Cynthia Dries Date Provider Notified: 09/01/23 Time Provider Notified: 1947 Method of Notification: Call, Page Notification Reason: Change in status Provider response: En route Date of Provider Response: 09/01/23 Time of Provider Response: 1950 Notify: Rapid Response Name of Rapid Response RN Notified: David,RN Date Rapid Response Notified: 09/01/23 Time Rapid  Response Notified: 1945  BP 76/56 Map 62 at 1946.  RR nurse called.  On call MD paged and return call at 1950. Charge RN at bedside already once assessment indicated change in status/LOC.    Pt was lethargic, obtunded look on his face was not able to tell me why he was in the hospital other than pain, pt POD 1 for Lap appendectomy. Day RN emptied 50 from JP drain at 1700. At shift change and bedside report 1905 emptied 75mL out of JP drain, bloody output. On return to the room at 1945 JP drain was full again in the bulb did not empty the drainage. RR RN called while vitals obtained by charge RN revealing hypotension. Placed Pt in trendelenburg position. And Bolus started per RR nurse. MD notified as well as page to general surgery. MD at bedside to evaluate patient. Will give additional 500cc bolus to make 1L bolus given. Will monitor vitals per MEWS guidelines.     Maryhelen Snide 09/01/2023, 8:30 PM

## 2023-09-01 NOTE — Progress Notes (Addendum)
 I was notified by nursing staff regarding Mr. Ogletree's BP 76/56. I instructed to give LR bolus 500cc over the telephone. Upon arrival, Mr. Hulme was alert, oriented to person, place and situation. Joking with staff. Skin was warm pink and dry. Denies pain. Reportedly increased sanginous drainage from JP with 75cc at shift change and drain is full again.  BP improved with bolus.   1957-97.76F, HR 103 ST, 100/65, RR 18 sats 100 on RA

## 2023-09-01 NOTE — Progress Notes (Signed)
 MEWS Progress Note  Patient Details Name: Christian [name] MRN: [id number] DOB: 05/17/1945 Today's Date: 09/01/2023   MEWS Flowsheet Documentation:  Assess: MEWS Score Temp: 97.8 F (36.6 C) BP: 100/74 MAP (mmHg): 82 Pulse Rate: (!) 110 ECG Heart Rate: (!) 107 Resp: 16 Level of Consciousness: Alert SpO2: 98 % O2 Device: Room Air Patient Activity (if Appropriate): In bed O2 Flow Rate (L/min): 5 L/min Assess: MEWS Score MEWS Temp: 0 MEWS Systolic: 1 MEWS Pulse: 1 MEWS RR: 0 MEWS LOC: 0 MEWS Score: 2 MEWS Score Color: Yellow Assess: SIRS CRITERIA SIRS Temperature : 0 SIRS Respirations : 0 SIRS Pulse: 1 SIRS WBC: 0 SIRS Score Sum : 1 SIRS Temperature : 0 SIRS Pulse: 1 SIRS Respirations : 0 SIRS WBC: 0 SIRS Score Sum : 1 Assess: if the MEWS score is Yellow or Red Were vital signs accurate and taken at a resting state?: Yes Does the patient meet 2 or more of the SIRS criteria?: No MEWS guidelines implemented : Yes, yellow Treat MEWS Interventions: Considered administering scheduled or prn medications/treatments as ordered Take Vital Signs Increase Vital Sign Frequency : Yellow: Q2hr x1, continue Q4hrs until patient remains green for 12hrs Escalate MEWS: Escalate: Yellow: Discuss with charge nurse and consider notifying provider and/or RRT Notify: Charge Nurse/RN Name of Charge Nurse/RN Notified: Reese Canny 09/01/2023, 6:29 AM

## 2023-09-01 NOTE — Progress Notes (Signed)
 MEWS Progress Note  Patient Details Name: Christian [name] MRN: [id number] DOB: [birth year] Today's Date: 09/01/2023   MEWS Flowsheet Documentation:  Assess: MEWS Score Temp: (!) 97.5 F (36.4 C) BP: (!) 88/55 MAP (mmHg): 67 Pulse Rate: 97 ECG Heart Rate: (!) 101 Resp: 18 Level of Consciousness: New agitation confusion SpO2: 99 % O2 Device: Room Air Patient Activity (if Appropriate): In bed O2 Flow Rate (L/min): 5 L/min Assess: MEWS Score MEWS Temp: 0 MEWS Systolic: 1 MEWS Pulse: 1 MEWS RR: 0 MEWS LOC: 1 MEWS Score: 3 MEWS Score Color: Yellow Assess: SIRS CRITERIA SIRS Temperature : 0 SIRS Respirations : 0 SIRS Pulse: 1 SIRS WBC: 1 SIRS Score Sum : 2 SIRS Temperature : 0 SIRS Pulse: 1 SIRS Respirations : 0 SIRS WBC: 1 SIRS Score Sum : 2 Assess: if the MEWS score is Yellow or Red Were vital signs accurate and taken at a resting state?: Yes Does the patient meet 2 or more of the SIRS criteria?: Yes Does the patient have a confirmed or suspected source of infection?: Yes MEWS guidelines implemented : No, previously red, continue vital signs every 4 hours Treat MEWS Interventions: Considered administering scheduled or prn medications/treatments as ordered Take Vital Signs Increase Vital Sign Frequency : Red: Q1hr x2, continue Q4hrs until patient remains green for 12hrs Escalate MEWS: Escalate: Red: Discuss with charge nurse and notify provider. Consider notifying RRT. If remains red for 2 hours consider need for higher level of care Notify: Charge Nurse/RN Name of Charge Nurse/RN Notified: Terrilee Few 09/01/2023, 8:54 PM

## 2023-09-01 NOTE — Progress Notes (Signed)
 1 Day Post-Op  Subjective: Patient having pain today.  Says it's worse today than yesterday.  Doesn't feel bloated but is very nauseated and feels like he is going to vomit.  UOP around 200cc yesterday.  JP with 250cc documented since return from OR  ROS: See above, otherwise other systems negative  Objective: Vital signs in last 24 hours: Temp:  [97.5 F (36.4 C)-98.2 F (36.8 C)] 97.9 F (36.6 C) (05/07 0737) Pulse Rate:  [85-115] 115 (05/07 0737) Resp:  [15-23] 16 (05/07 0610) BP: (100-161)/(37-89) 100/62 (05/07 0737) SpO2:  [94 %-100 %] 100 % (05/07 0737) Weight:  [101.6 kg] 101.6 kg (05/06 1408) Last BM Date :  (prior to surgery)  Intake/Output from previous day: 05/06 0701 - 05/07 0700 In: 1133.1 [P.O.:240; I.V.:400; IV Piggyback:493.1] Out: 460 [Urine:200; Drains:250; Blood:10] Intake/Output this shift: No intake/output data recorded.  PE: Gen: NAD, but moans when being moved or palpated Heart: regular, + murmur Lungs: CTAB Abd: soft, mild distention (although patient states this is his normal abdomen, hypoactive BS, JP drain is bloody, 250cc documented since return from OR, incisions c/d/I GU: condom cath in place  Lab Results:  Recent Labs    08/31/23 0533 09/01/23 0617  WBC 17.0* 17.6*  HGB 14.2 11.6*  HCT 43.1 35.2*  PLT 209 249   BMET Recent Labs    08/31/23 0416 09/01/23 0617  NA 141 138  K 3.0* 4.0  CL 98 99  CO2 25 19*  GLUCOSE 79 224*  BUN 90* 105*  CREATININE 3.88* 4.70*  CALCIUM  9.3 8.6*   PT/INR No results for input(s): "LABPROT", "INR" in the last 72 hours. CMP     Component Value Date/Time   NA 138 09/01/2023 0617   NA 143 04/16/2022 0000   K 4.0 09/01/2023 0617   CL 99 09/01/2023 0617   CO2 19 (L) 09/01/2023 0617   GLUCOSE 224 (H) 09/01/2023 0617   BUN 105 (H) 09/01/2023 0617   BUN 35 (H) 05/22/2021 1551   CREATININE 4.70 (H) 09/01/2023 0617   CREATININE 2.87 (H) 12/20/2019 1312   CALCIUM  8.6 (L) 09/01/2023 0617    PROT 6.7 09/01/2023 0617   PROT 6.9 05/22/2021 1551   ALBUMIN  2.7 (L) 09/01/2023 0617   ALBUMIN  4.5 05/22/2021 1551   AST 17 09/01/2023 0617   ALT 17 09/01/2023 0617   ALKPHOS 55 09/01/2023 0617   BILITOT 1.0 09/01/2023 0617   BILITOT 0.2 05/22/2021 1551   GFRNONAA 12 (L) 09/01/2023 0617   GFRAA 19 (L) 10/18/2019 0046   Lipase     Component Value Date/Time   LIPASE 28 08/30/2023 1727       Studies/Results: CT ABDOMEN PELVIS WO CONTRAST Result Date: 08/30/2023 CLINICAL DATA:  RLQ abdominal pain EXAM: CT ABDOMEN AND PELVIS WITHOUT CONTRAST TECHNIQUE: Multidetector CT imaging of the abdomen and pelvis was performed following the standard protocol without IV contrast. RADIATION DOSE REDUCTION: This exam was performed according to the departmental dose-optimization program which includes automated exposure control, adjustment of the mA and/or kV according to patient size and/or use of iterative reconstruction technique. COMPARISON:  September 28, 2019 FINDINGS: Of note, the lack of intravenous contrast limits evaluation of the solid organ parenchyma and vascularity. Lower chest: Trace right and small left pleural effusions. Rounded airspace consolidation in the left lower lobe, likely round atelectasis. Right coronary atherosclerosis. Hepatobiliary: No mass. No radiopaque stones or wall thickening of the gallbladder. No intrahepatic or extrahepatic biliary ductal dilation. Pancreas: No mass or  main ductal dilation. No peripancreatic inflammation or fluid collection. Spleen: Normal size. No mass. Adrenals/Urinary Tract: Unchanged diffuse nodular thickening of both adrenal glands. Unchanged small bilateral renal cysts. No renal mass. No hydronephrosis or nephrolithiasis. Circumferential wall thickening of the urinary bladder. Stomach/Bowel: The stomach is decompressed without focal abnormality. No small bowel wall thickening or inflammation. No small bowel obstruction.Distended, inflamed appendix with wall  thickening. The appendiceal tip is most notably enlarged measuring 2 cm. A couple of small gas locules noted along the tip (axial 64). Small, mixed density, ovoid structure along the sigmoid mesocolon (axial 65, sagittal 110), measuring 17 x 23 x 14 mm. Descending and sigmoid colonic diverticulosis. Mild wall thickening of the sigmoid colon, likely reactive. Vascular/Lymphatic: No aortic aneurysm. Diffuse aortoiliac atherosclerosis. No intraabdominal or pelvic lymphadenopathy. Reproductive: No prostatomegaly. No free pelvic fluid. Other: No pneumoperitoneum, ascites, or mesenteric inflammation. Musculoskeletal: No acute fracture or destructive lesion. Multilevel degenerative disc disease of the spine. Osteopenia. Screw and plate fixation of the lower sternum. IMPRESSION: 1. Acute appendicitis. Apparent irregularity of the appendiceal tip with a couple of small locules of gas. While these locules of gas may be intraluminal, a small periappendiceal abscess containing locules of gas could also have this appearance and would be worrisome for perforation. 2. Small, mixed density, ovoid structure along the sigmoid mesocolon (axial 65, sagittal 110), measuring 17 x 23 x 14 mm, worrisome for a small periappendiceal abscess. 3. Descending and sigmoid colonic diverticulosis. Mild wall thickening of the sigmoid colon, is likely reactive. 4. Small left pleural effusion with a trace right pleural effusion. Rounded airspace consolidation in the left lower lobe, likely round atelectasis. Electronically Signed   By: Rance Burrows M.D.   On: 08/30/2023 22:23    Anti-infectives: Anti-infectives (From admission, onward)    Start     Dose/Rate Route Frequency Ordered Stop   08/31/23 2200  piperacillin-tazobactam (ZOSYN) IVPB 2.25 g        2.25 g 100 mL/hr over 30 Minutes Intravenous Every 8 hours 08/31/23 1953     08/31/23 0600  piperacillin-tazobactam (ZOSYN) IVPB 3.375 g  Status:  Discontinued        3.375 g 100 mL/hr  over 30 Minutes Intravenous Every 8 hours 08/31/23 0532 08/31/23 1953   08/30/23 2245  piperacillin-tazobactam (ZOSYN) IVPB 3.375 g        3.375 g 100 mL/hr over 30 Minutes Intravenous  Once 08/30/23 2233 08/31/23 0005        Assessment/Plan POD 1, s/p lap appy with JP drain placement for perforated appendicitis [name] 5/6  -having nausea today, decrease diet to CLD, but if starts vomiting, may need to go to NPO and NGT placed.  Discussed possibility with patient. -JP is bloody.  Doubt active bleeding.  Likely oozy after OR given plavix  still in system.  Hgb down from 14 to 11.  Monitor -cont heparin  SQ for right now.   -WBC still 17.  Cont zosyn due to perforation, likely 5 days post op -mobilize, PT eval -continue to closely monitor  FEN - CLD, IVFs VTE - heparin   ID - Zosyn  CKD - cr up to 4.7.  likely a bit dry.  Will gently hydrate some today.  D/w primary service ABL anemia - hgb down from 14 to 11.  May be somewhat dilutional, but also appears a bit dry too.  He does have some bloody output in his JP drain and Plavix  hasn't fully washed out, so he could have oozed some.  Doubt active bleeding given drain output has slowed down, this isn't bright red, and vitals are stable.  Ok for heparin  TID for prophylaxis right now.  If worsening signs of bleeding, will hold this. CAD/CHF - continue to hold plavix  for now HTN DM    LOS: 1 day    Leone Ralphs , [facility] 09/01/2023, 8:48 AM Please see Amion for pager number during day hours 7:00am-4:30pm or 7:00am -11:30am on weekends

## 2023-09-01 NOTE — Progress Notes (Addendum)
 Overnight floor coverage  Informed by RN that patient is hypotensive with blood pressure 76/53, tachycardic, and has become more altered/lethargic.  He is having increasing bloody drainage from his JP drain.  Approximately 225 mL bloody drainage from JP drain since 5 PM today.  Rapid response was called and 500 mL LR bolus given.  Chart quickly reviewed and in brief patient admitted for acute appendicitis with possible periappendiceal abscess and underwent laparoscopic appendectomy yesterday.   Patient was immediately seen and examined at bedside.  At the time of my arrival, patient noted to be awake and alert, answering questions.  He has no complaints and denies abdominal pain.  Blood pressure now improved to 100/65 and heart rate 103.  Temperature 97.6 F.  Not tachypneic or hypoxic.  Lungs clear to auscultation.  Abdomen appears distended but not rigid and bowel sounds present.  Right lower abdominal quadrant tender to palpation.  I discussed the case with Dr. Camilo Cella on-call for general surgery.  He will see the patient.  Recommending repeating H&H and transfusing 2 units PRBCs if hemoglobin less than 8.  Patient has been receiving subcutaneous heparin  for DVT prophylaxis which has been discontinued.  -Continue IV fluid resuscitation and monitor blood pressure closely -Type and screen -Urgent labs ordered including CBC and lactate -Subcutaneous heparin  discontinued -SCDs for DVT prophylaxis -Scheduled IV metoprolol  discontinued  Addendum/update: Patient's vital signs have improved after 1 L IV fluids but lactate has trended up.    Most recent vitals: Temperature 97.6 F, pulse 97, respiratory rate 17, blood pressure 108/62, and SpO2 99% on room air.    Labs showing slight improvement of leukocytosis but lactate has trended up after IV fluids from 2.3> 2.9.  Hemoglobin 11.0 this afternoon > 10.2 on repeat labs tonight.  I have discussed with Dr. Camilo Cella again who has evaluated the patient at  bedside and does not feel that there is a need to repeat abdominal imaging at this time as patient's vital signs have improved and there is no significant drop in hemoglobin.  He recommends giving additional 500 mL IV fluids.  Will continue IV fluid resuscitation and trend lactate.  Continue Zosyn.

## 2023-09-02 ENCOUNTER — Inpatient Hospital Stay (HOSPITAL_COMMUNITY)

## 2023-09-02 ENCOUNTER — Other Ambulatory Visit (HOSPITAL_COMMUNITY): Payer: Self-pay

## 2023-09-02 DIAGNOSIS — K3580 Unspecified acute appendicitis: Secondary | ICD-10-CM

## 2023-09-02 DIAGNOSIS — N179 Acute kidney failure, unspecified: Secondary | ICD-10-CM | POA: Diagnosis not present

## 2023-09-02 DIAGNOSIS — D62 Acute posthemorrhagic anemia: Secondary | ICD-10-CM

## 2023-09-02 DIAGNOSIS — K37 Unspecified appendicitis: Secondary | ICD-10-CM | POA: Diagnosis not present

## 2023-09-02 LAB — GLUCOSE, CAPILLARY
Glucose-Capillary: 113 mg/dL — ABNORMAL HIGH (ref 70–99)
Glucose-Capillary: 123 mg/dL — ABNORMAL HIGH (ref 70–99)
Glucose-Capillary: 137 mg/dL — ABNORMAL HIGH (ref 70–99)
Glucose-Capillary: 159 mg/dL — ABNORMAL HIGH (ref 70–99)
Glucose-Capillary: 67 mg/dL — ABNORMAL LOW (ref 70–99)
Glucose-Capillary: 79 mg/dL (ref 70–99)
Glucose-Capillary: 96 mg/dL (ref 70–99)

## 2023-09-02 LAB — HEMOGLOBIN AND HEMATOCRIT, BLOOD
HCT: 23.7 % — ABNORMAL LOW (ref 39.0–52.0)
Hemoglobin: 7.6 g/dL — ABNORMAL LOW (ref 13.0–17.0)

## 2023-09-02 LAB — CBC
HCT: 27.9 % — ABNORMAL LOW (ref 39.0–52.0)
Hemoglobin: 9.1 g/dL — ABNORMAL LOW (ref 13.0–17.0)
MCH: 34.3 pg — ABNORMAL HIGH (ref 26.0–34.0)
MCHC: 32.6 g/dL (ref 30.0–36.0)
MCV: 105.3 fL — ABNORMAL HIGH (ref 80.0–100.0)
Platelets: 207 10*3/uL (ref 150–400)
RBC: 2.65 MIL/uL — ABNORMAL LOW (ref 4.22–5.81)
RDW: 14.2 % (ref 11.5–15.5)
WBC: 16.2 10*3/uL — ABNORMAL HIGH (ref 4.0–10.5)
nRBC: 0 % (ref 0.0–0.2)

## 2023-09-02 LAB — BASIC METABOLIC PANEL WITH GFR
Anion gap: 16 — ABNORMAL HIGH (ref 5–15)
BUN: 115 mg/dL — ABNORMAL HIGH (ref 8–23)
CO2: 22 mmol/L (ref 22–32)
Calcium: 8.2 mg/dL — ABNORMAL LOW (ref 8.9–10.3)
Chloride: 97 mmol/L — ABNORMAL LOW (ref 98–111)
Creatinine, Ser: 5.33 mg/dL — ABNORMAL HIGH (ref 0.61–1.24)
GFR, Estimated: 10 mL/min — ABNORMAL LOW (ref >60)
Glucose, Bld: 162 mg/dL — ABNORMAL HIGH (ref 70–99)
Potassium: 3.5 mmol/L (ref 3.5–5.1)
Sodium: 135 mmol/L (ref 135–145)

## 2023-09-02 LAB — LACTIC ACID, PLASMA: Lactic Acid, Venous: 1.4 mmol/L (ref 0.5–1.9)

## 2023-09-02 LAB — SURGICAL PATHOLOGY

## 2023-09-02 MED ORDER — TRAMADOL HCL 50 MG PO TABS
50.0000 mg | ORAL_TABLET | ORAL | Status: DC | PRN
  Administered 2023-09-03 – 2023-09-05 (×5): 50 mg via ORAL
  Filled 2023-09-02 (×8): qty 1

## 2023-09-02 MED ORDER — DEXTROSE 50 % IV SOLN
12.5000 g | INTRAVENOUS | Status: DC | PRN
  Administered 2023-09-03 – 2023-09-07 (×5): 12.5 g via INTRAVENOUS
  Filled 2023-09-02 (×4): qty 50

## 2023-09-02 MED ORDER — DEXTROSE 50 % IV SOLN
12.5000 g | INTRAVENOUS | Status: AC
  Administered 2023-09-02: 12.5 g via INTRAVENOUS
  Filled 2023-09-02: qty 50

## 2023-09-02 MED ORDER — METHOCARBAMOL 500 MG PO TABS
1000.0000 mg | ORAL_TABLET | Freq: Three times a day (TID) | ORAL | Status: DC
  Administered 2023-09-03 – 2023-09-05 (×9): 1000 mg via ORAL
  Filled 2023-09-02 (×10): qty 2

## 2023-09-02 MED ORDER — IOHEXOL 350 MG/ML SOLN
100.0000 mL | Freq: Once | INTRAVENOUS | Status: AC | PRN
  Administered 2023-09-02: 100 mL via INTRAVENOUS

## 2023-09-02 MED ORDER — MORPHINE SULFATE (PF) 2 MG/ML IV SOLN
2.0000 mg | INTRAVENOUS | Status: DC | PRN
  Administered 2023-09-03 – 2023-09-08 (×9): 2 mg via INTRAVENOUS
  Filled 2023-09-02 (×8): qty 1

## 2023-09-02 MED ORDER — LACTATED RINGERS IV SOLN: INTRAVENOUS | Status: DC

## 2023-09-02 NOTE — Evaluation (Signed)
 Physical Therapy Evaluation Patient Details Name: Christian [name] MRN: [id number] DOB: Apr 10, 1946 Today's Date: 09/02/2023  History of Present Illness  78 y.o. male presents to [facility] 08/30/23 with R lower quadrant abdominal pain and 2-3 episodes of vomiting. Pt with appendicitis and perforated appendix s/p laparoscopic appendectomy 5/6. PMHx: CAD s/p CABG, HTN, CKD 4, DMT2   Clinical Impression  Pt in bed upon arrival and agreeable to PT eval. PTA, pt was ModI with a RW or cane for mobility. Pt/family reported having two falls in the past sixth months with unknown precipitating events. In today's session, pt had increased abdominal pain with mobility and required MaxA to roll and move from sidelying-sit. Pt was unable to tolerate upright position and immediately returned to supine. Pt has 24/7 assist from family at home, however, wife only able to provide light physical assist. Pt would benefit from <3hrs post acute rehab to work towards independence with mobility and prevent future falls. Pt currently with functional limitations due to the deficits listed below (see PT Problem List). Pt would benefit from acute skilled PT to address functional impairments. Acute PT to follow.         If plan is discharge home, recommend the following: A lot of help with walking and/or transfers;A lot of help with bathing/dressing/bathroom;Assistance with cooking/housework;Assist for transportation;Help with stairs or ramp for entrance   Can travel by private vehicle   No    Equipment Recommendations None recommended by PT  Recommendations for Other Services  OT consult    Functional Status Assessment Patient has had a recent decline in their functional status and demonstrates the ability to make significant improvements in function in a reasonable and predictable amount of time.     Precautions / Restrictions Precautions Precautions: Fall Precaution/Restrictions Comments: abdominal incision, JP  drain Restrictions Weight Bearing Restrictions Per Provider Order: No      Mobility  Bed Mobility Overal bed mobility: Needs Assistance Bed Mobility: Sidelying to Sit, Rolling, Sit to Sidelying Rolling: Max assist, Used rails Sidelying to sit: Max assist, Used rails     Sit to sidelying: Max assist General bed mobility comments: cues for technique with pt able to move LE's towards EOB. MaxA to roll with use of bed pad and to raise trunk. Pt unable to tolerate upright position with MaxA to return to supine. TotalAx2 to scoot towards HOB in supine.    Transfers      General transfer comment: deferred 2/2 pain       Balance Overall balance assessment: Needs assistance, History of Falls Sitting-balance support: Bilateral upper extremity supported, Feet supported Sitting balance-Leahy Scale: Zero Sitting balance - Comments: unable to tolerate upright posture       Pertinent Vitals/Pain Pain Assessment Pain Assessment: Faces Faces Pain Scale: Hurts even more Pain Location: abdomen Pain Descriptors / Indicators: Aching, Discomfort Pain Intervention(s): Limited activity within patient's tolerance, Monitored during session, Repositioned    Home Living Family/patient expects to be discharged to:: Private residence Living Arrangements: Spouse/significant other Available Help at Discharge: Family;Available 24 hours/day (light physical assist) Type of Home: House Home Access: Stairs to enter Entrance Stairs-Rails:  (walls on both sides) Entrance Stairs-Number of Steps: 2   Home Layout: One level Home Equipment: Shower seat;Tub bench;Grab bars - tub/shower;BSC/3in1;Rolling Walker (2 wheels);Cane - single point;Wheelchair Financial trader (4 wheels);Hand held shower head      Prior Function Prior Level of Function : Independent/Modified Independent;History of Falls (last six months)    Mobility Comments: ModI with  RW or cane, x2 falls in past 6 months with unknown MOI ADLs  Comments: Ind     Extremity/Trunk Assessment   Upper Extremity Assessment Upper Extremity Assessment: Defer to OT evaluation    Lower Extremity Assessment Lower Extremity Assessment: Generalized weakness    Cervical / Trunk Assessment Cervical / Trunk Assessment: Normal  Communication   Communication Communication: No apparent difficulties    Cognition Arousal: Alert Behavior During Therapy: WFL for tasks assessed/performed   PT - Cognitive impairments: Orientation, Difficult to assess Difficult to assess due to:  (joking personality) Orientation impairments: Time (Month)    PT - Cognition Comments: difficult to assess cognition as pt would joke around and seemingly answer questions incorrectly. Following commands: Impaired Following commands impaired: Only follows one step commands consistently, Follows one step commands with increased time     Cueing Cueing Techniques: Verbal cues, Tactile cues     General Comments General comments (skin integrity, edema, etc.): Wife and brother present and supportive during session.     PT Assessment Patient needs continued PT services  PT Problem List Decreased strength;Decreased activity tolerance;Decreased balance;Decreased mobility       PT Treatment Interventions DME instruction;Gait training;Functional mobility training;Therapeutic activities;Balance training;Therapeutic exercise;Stair training;Neuromuscular re-education;Patient/family education    PT Goals (Current goals can be found in the Care Plan section)  Acute Rehab PT Goals Patient Stated Goal: to have less pain when moving PT Goal Formulation: With patient/family Time For Goal Achievement: 09/16/23 Potential to Achieve Goals: Good    Frequency Min 2X/week        AM-PAC PT "6 Clicks" Mobility  Outcome Measure Help needed turning from your back to your side while in a flat bed without using bedrails?: Total Help needed moving from lying on your back to  sitting on the side of a flat bed without using bedrails?: Total Help needed moving to and from a bed to a chair (including a wheelchair)?: Total Help needed standing up from a chair using your arms (e.g., wheelchair or bedside chair)?: Total Help needed to walk in hospital room?: Total Help needed climbing 3-5 steps with a railing? : Total 6 Click Score: 6    End of Session   Activity Tolerance: Patient limited by pain Patient left: in bed;with call bell/phone within reach;with bed alarm set;with family/visitor present Nurse Communication: Mobility status PT Visit Diagnosis: Unsteadiness on feet (R26.81);Other abnormalities of gait and mobility (R26.89);Muscle weakness (generalized) (M62.81)    Time: 1610-9604 PT Time Calculation (min) (ACUTE ONLY): 37 min   Charges:   PT Evaluation $PT Eval Low Complexity: 1 Low PT Treatments $Therapeutic Activity: 8-22 mins PT General Charges $$ ACUTE PT VISIT: 1 Visit        Orysia Blas, PT, DPT Secure Chat Preferred  Rehab Office [phone]   Alissa April Adela Ades 09/02/2023, 4:45 PM

## 2023-09-02 NOTE — Progress Notes (Signed)
 Progress Note    Christian [name]   ZOX:[id number]  DOB: [birth year]  DOA: 08/30/2023     2 PCP: [name], MD  Initial CC: Right lower quadrant abdominal pain  Hospital Course: Christian [name] is a 78 year old with history of CAD status post CABG with postoperative CVA and renal failure necessitating brief hemodialysis who presented to the emergency department with complaints of right lower quadrant abdominal pain.  Imaging confirmed acute appendicitis.  General surgery was consulted and cardiology was consulted to assist with preoperative cardiac clearance.   He underwent laparoscopic appendectomy on 08/31/2023.  Appendicitis with possible periappendiceal abscess - s/p lap appy 5/6 - continue zosyn x 5 days post op (or as per surgery if further modification) - drain remains in place with bloody fluid noted - some drop in Hgb; likely multifactorial of hemodilution but also had plavix  prior to surgery so suspect some component of blood loss and stabilizing - CTA A/P obtained 5/8 and negative for bleeding and appears to be postop changes otherwise - continue trending H/H  ABLA - As noted above, possibly component of hemodilution with some blood loss with recent surgery and postop with oozing into bulb -CT angio was negative for active bleeding -Hgb 9.1 g/dL early this morning -Continue trending  HTN - BP more stable now - hold home meds  Acute on CKDIV - patient has history of CKD4. Baseline creat ~ 3.2 - 3.5, eGFR~ 17 - possibly volume depletion from surgery/blood loss - at risk for ATN - creat uptrend to 5.3 today; no indication for HD at this time; no worrisome renal findings on CTA - started on IVF 5/7; will monitor response - if continues to rise and warrants potential HD, will consult nephrology at that time   DMII - Continue SSI and CBG monitoring  CAD - s/p CABG - cleared by cardiology prior to surgery  Interval History:  Became hypotensive and tachycardic  overnight with some altered mentation/lethargy.  Persistent bloody output from JP drain.  He was given LR fluid bolus but did not require any blood transfusions. Hemoglobin slightly lower this morning but still not warranting blood.  Mentation improved and still communicating effectively with no significant lethargy perceived. His actual affect and cognitive ability has always been questionable.   Old records reviewed in assessment of this patient  Antimicrobials: Zosyn 08/30/2023 >> current  DVT prophylaxis:  Place and maintain sequential compression device Start: 09/01/23 2001 SCDs Start: 08/31/23 0415   Code Status:   Code Status: Full Code  Mobility Assessment (Last 72 Hours)     Mobility Assessment     Row Name 09/01/23 1947 09/01/23 0954 09/01/23 0200 08/31/23 2100 08/31/23 0800   Does patient have an order for bedrest or is patient medically unstable No - Continue assessment No - Continue assessment No - Continue assessment No - Continue assessment No - Continue assessment   What is the highest level of mobility based on the progressive mobility assessment? Level 3 (Stands with assist) - Balance while standing  and cannot march in place Level 3 (Stands with assist) - Balance while standing  and cannot march in place Level 3 (Stands with assist) - Balance while standing  and cannot march in place Level 3 (Stands with assist) - Balance while standing  and cannot march in place Level 1 (Bedfast) - Unable to balance while sitting on edge of bed   Is the above level different from baseline mobility prior to current illness? Yes - Recommend  PT order Yes - Recommend PT order Yes - Recommend PT order Yes - Recommend PT order Yes - Recommend PT order            Barriers to discharge: None Disposition Plan: Home HH orders placed:  Status is: Inpatient  Objective: Blood pressure 134/75, pulse (!) 108, temperature 98.8 F (37.1 C), temperature source Oral, resp. rate 19, height 5\' 9"   (1.753 m), weight 101.6 kg, SpO2 99%.  Examination:  Physical Exam Constitutional:      General: He is not in acute distress.    Appearance: Normal appearance. He is obese.  HENT:     Head: Normocephalic and atraumatic.     Mouth/Throat:     Mouth: Mucous membranes are moist.  Eyes:     Extraocular Movements: Extraocular movements intact.  Cardiovascular:     Rate and Rhythm: Normal rate and regular rhythm.  Pulmonary:     Effort: Pulmonary effort is normal. No respiratory distress.     Breath sounds: Normal breath sounds. No wheezing.  Abdominal:     General: Abdomen is protuberant. Bowel sounds are decreased. There is no distension.     Palpations: Abdomen is soft.     Tenderness: There is abdominal tenderness.     Comments: Surgical drain in place with serosanguineous fluid noted  Musculoskeletal:        General: Normal range of motion.     Cervical back: Normal range of motion and neck supple.  Skin:    General: Skin is warm and dry.  Neurological:     General: No focal deficit present.     Mental Status: He is alert.  Psychiatric:        Mood and Affect: Mood normal.        Behavior: Behavior normal.      Consultants:  General surgery  Procedures:  08/31/2023: Laparoscopic appendectomy  Data Reviewed: Results for orders placed or performed during the hospital encounter of 08/30/23 (from the past 24 hours)  Glucose, capillary     Status: Abnormal   Collection Time: 09/01/23  5:01 PM  Result Value Ref Range   Glucose-Capillary 225 (H) 70 - 99 mg/dL  Glucose, capillary     Status: Abnormal   Collection Time: 09/01/23  7:58 PM  Result Value Ref Range   Glucose-Capillary 227 (H) 70 - 99 mg/dL  Glucose, capillary     Status: Abnormal   Collection Time: 09/01/23  8:29 PM  Result Value Ref Range   Glucose-Capillary 208 (H) 70 - 99 mg/dL  Lactic acid, plasma     Status: Abnormal   Collection Time: 09/01/23  8:37 PM  Result Value Ref Range   Lactic Acid, Venous 2.3  (HH) 0.5 - 1.9 mmol/L  Type and screen [facility]     Status: None   Collection Time: 09/01/23  8:48 PM  Result Value Ref Range   ABO/RH(D) O POS    Antibody Screen NEG    Sample Expiration      09/04/2023,2359 Performed at [facility], [address], [place], [place] [address]   CBC     Status: Abnormal   Collection Time: 09/01/23  8:53 PM  Result Value Ref Range   WBC 16.0 (H) 4.0 - 10.5 K/uL   RBC 2.95 (L) 4.22 - 5.81 MIL/uL   Hemoglobin 10.2 (L) 13.0 - 17.0 g/dL   HCT 60.4 (L) 54.0 - 98.1 %   MCV 108.8 (H) 80.0 - 100.0 fL  MCH 34.6 (H) 26.0 - 34.0 pg   MCHC 31.8 30.0 - 36.0 g/dL   RDW 56.2 13.0 - 86.5 %   Platelets 219 150 - 400 K/uL   nRBC 0.0 0.0 - 0.2 %  Lactic acid, plasma     Status: Abnormal   Collection Time: 09/01/23 10:29 PM  Result Value Ref Range   Lactic Acid, Venous 2.9 (HH) 0.5 - 1.9 mmol/L  Glucose, capillary     Status: Abnormal   Collection Time: 09/01/23 11:58 PM  Result Value Ref Range   Glucose-Capillary 159 (H) 70 - 99 mg/dL  Basic metabolic panel with GFR     Status: Abnormal   Collection Time: 09/02/23  1:12 AM  Result Value Ref Range   Sodium 135 135 - 145 mmol/L   Potassium 3.5 3.5 - 5.1 mmol/L   Chloride 97 (L) 98 - 111 mmol/L   CO2 22 22 - 32 mmol/L   Glucose, Bld 162 (H) 70 - 99 mg/dL   BUN 784 (H) 8 - 23 mg/dL   Creatinine, Ser 6.96 (H) 0.61 - 1.24 mg/dL   Calcium  8.2 (L) 8.9 - 10.3 mg/dL   GFR, Estimated 10 (L) >60 mL/min   Anion gap 16 (H) 5 - 15  CBC     Status: Abnormal   Collection Time: 09/02/23  1:12 AM  Result Value Ref Range   WBC 16.2 (H) 4.0 - 10.5 K/uL   RBC 2.65 (L) 4.22 - 5.81 MIL/uL   Hemoglobin 9.1 (L) 13.0 - 17.0 g/dL   HCT 29.5 (L) 28.4 - 13.2 %   MCV 105.3 (H) 80.0 - 100.0 fL   MCH 34.3 (H) 26.0 - 34.0 pg   MCHC 32.6 30.0 - 36.0 g/dL   RDW 44.0 10.2 - 72.5 %   Platelets 207 150 - 400 K/uL   nRBC 0.0 0.0 - 0.2 %  Lactic acid, plasma     Status: None   Collection Time: 09/02/23  1:12  AM  Result Value Ref Range   Lactic Acid, Venous 1.4 0.5 - 1.9 mmol/L  Glucose, capillary     Status: Abnormal   Collection Time: 09/02/23  4:10 AM  Result Value Ref Range   Glucose-Capillary 137 (H) 70 - 99 mg/dL  Culture, blood (Routine X 2) w Reflex to ID Panel     Status: None (Preliminary result)   Collection Time: 09/02/23  7:52 AM   Specimen: BLOOD RIGHT ARM  Result Value Ref Range   Specimen Description BLOOD RIGHT ARM    Special Requests      BOTTLES DRAWN AEROBIC AND ANAEROBIC Blood Culture adequate volume   Culture      NO GROWTH < 12 HOURS Performed at [facility], [address], [place], [place] [address]    Report Status PENDING   Culture, blood (Routine X 2) w Reflex to ID Panel     Status: None (Preliminary result)   Collection Time: 09/02/23  7:52 AM   Specimen: BLOOD RIGHT ARM  Result Value Ref Range   Specimen Description BLOOD RIGHT ARM    Special Requests      BOTTLES DRAWN AEROBIC AND ANAEROBIC Blood Culture adequate volume   Culture      NO GROWTH < 12 HOURS Performed at [facility], [address], [place], [place] [address]    Report Status PENDING   Glucose, capillary     Status: Abnormal   Collection Time: 09/02/23  8:13 AM  Result  Value Ref Range   Glucose-Capillary 123 (H) 70 - 99 mg/dL  Glucose, capillary     Status: None   Collection Time: 09/02/23 12:11 PM  Result Value Ref Range   Glucose-Capillary 96 70 - 99 mg/dL    I have reviewed pertinent nursing notes, vitals, labs, and images as necessary. I have ordered labwork to follow up on as indicated.  I have reviewed the last notes from staff over past 24 hours. I have discussed patient's care plan and test results with nursing staff, CM/SW, and other staff as appropriate.  Time spent: Greater than 50% of the 55 minute visit was spent in counseling/coordination of care for the patient as laid out in the A&P.   LOS: 2 days   Faith Homes, MD [facility] 09/02/2023,  3:20 PM

## 2023-09-02 NOTE — Progress Notes (Signed)
   [facility] will sign off.   Medication Recommendations:  Meds as on MAR Other recommendations (labs, testing, etc):  NA Follow up as an outpatient:  Patient has follow in May with Dr. Ganji

## 2023-09-02 NOTE — Progress Notes (Signed)
 2 Days Post-Op  Subjective: Events from overnight noted.  Still with fairly significant bloody JP drainage from JP drain.  Emptied 150cc of bloody output while I was in the room.  The patient is in good spirits.  Making jokes.    Objective: Vital signs in last 24 hours: Temp:  [97.4 F (36.3 C)-100.1 F (37.8 C)] 98.1 F (36.7 C) (05/08 0810) Pulse Rate:  [94-115] 103 (05/08 0810) Resp:  [17-24] 18 (05/08 0810) BP: (76-134)/(55-74) 132/66 (05/08 0810) SpO2:  [95 %-100 %] 95 % (05/08 0810) Last BM Date : 09/01/23  Intake/Output from previous day: 05/07 0701 - 05/08 0700 In: 2330.2 [I.V.:1680.2; IV Piggyback:650] Out: 750 [Urine:275; Drains:475] Intake/Output this shift: No intake/output data recorded.  PE: Gen: NAD Heart: regular, + murmur, mild tachy in low 100s Lungs: CTAB Abd: soft, mild distention, JP drain is bloody, 150cc emptied while I was seeing him. 475cc in 24 hrs, incisions c/d/I GU: condom cath in place with minimal UOP  Lab Results:  Recent Labs    09/01/23 2053 09/02/23 0112  WBC 16.0* 16.2*  HGB 10.2* 9.1*  HCT 32.1* 27.9*  PLT 219 207   BMET Recent Labs    09/01/23 0617 09/02/23 0112  NA 138 135  K 4.0 3.5  CL 99 97*  CO2 19* 22  GLUCOSE 224* 162*  BUN 105* 115*  CREATININE 4.70* 5.33*  CALCIUM  8.6* 8.2*   PT/INR No results for input(s): "LABPROT", "INR" in the last 72 hours. CMP     Component Value Date/Time   NA 135 09/02/2023 0112   NA 143 04/16/2022 0000   K 3.5 09/02/2023 0112   CL 97 (L) 09/02/2023 0112   CO2 22 09/02/2023 0112   GLUCOSE 162 (H) 09/02/2023 0112   BUN 115 (H) 09/02/2023 0112   BUN 35 (H) 05/22/2021 1551   CREATININE 5.33 (H) 09/02/2023 0112   CREATININE 2.87 (H) 12/20/2019 1312   CALCIUM  8.2 (L) 09/02/2023 0112   PROT 6.7 09/01/2023 0617   PROT 6.9 05/22/2021 1551   ALBUMIN  2.7 (L) 09/01/2023 0617   ALBUMIN  4.5 05/22/2021 1551   AST 17 09/01/2023 0617   ALT 17 09/01/2023 0617   ALKPHOS 55  09/01/2023 0617   BILITOT 1.0 09/01/2023 0617   BILITOT 0.2 05/22/2021 1551   GFRNONAA 10 (L) 09/02/2023 0112   GFRAA 19 (L) 10/18/2019 0046   Lipase     Component Value Date/Time   LIPASE 28 08/30/2023 1727       Studies/Results: No results found.   Anti-infectives: Anti-infectives (From admission, onward)    Start     Dose/Rate Route Frequency Ordered Stop   08/31/23 2200  piperacillin-tazobactam (ZOSYN) IVPB 2.25 g        2.25 g 100 mL/hr over 30 Minutes Intravenous Every 8 hours 08/31/23 1953     08/31/23 0600  piperacillin-tazobactam (ZOSYN) IVPB 3.375 g  Status:  Discontinued        3.375 g 100 mL/hr over 30 Minutes Intravenous Every 8 hours 08/31/23 0532 08/31/23 1953   08/30/23 2245  piperacillin-tazobactam (ZOSYN) IVPB 3.375 g        3.375 g 100 mL/hr over 30 Minutes Intravenous  Once 08/30/23 2233 08/31/23 0005        Assessment/Plan POD 2, s/p lap appy with JP drain placement for perforated appendicitis [name] 5/6  -NPO while working up possible bleeding -JP is bloody.  Hgb dropped to 9.1 today.  Did get 2 500cc boluses overnight  and some fluid yesterday, but still having a moderately quick flow of output into his JP drain which is bloody. -WBC still 16.  Cont zosyn due to perforation, likely 5 days post op -mobilize, PT eval -continue to closely monitor -[name] has ordered a Stat CTA to rule out active bleeding/extravasation given this constitution of findings.  I discussed with him, given his acute on chronic kidney disease that this contrast can further worsen his kidney function and result in the need for HD.  He told me he already has 2 fistulae and then just smiles at me.  He has a baseline odd affect, making jokes, but are somewhat tangential so difficult to tell if he fully understands the gravity of his situation.   FEN - NPO, IVFs VTE - heparin  on hold ID - Zosyn  CKD - cr up to 5.33.   ABL anemia - hgb down from 14 to 11, now 9.1.  CTA  pending CAD/CHF - continue to hold plavix  for now HTN DM    LOS: 2 days    Christian [name] , [facility] 09/02/2023, 8:45 AM Please see Amion for pager number during day hours 7:00am-4:30pm or 7:00am -11:30am on weekends

## 2023-09-03 ENCOUNTER — Inpatient Hospital Stay (HOSPITAL_COMMUNITY)

## 2023-09-03 ENCOUNTER — Other Ambulatory Visit (HOSPITAL_COMMUNITY): Payer: Self-pay

## 2023-09-03 DIAGNOSIS — K37 Unspecified appendicitis: Secondary | ICD-10-CM | POA: Diagnosis not present

## 2023-09-03 DIAGNOSIS — D62 Acute posthemorrhagic anemia: Secondary | ICD-10-CM | POA: Diagnosis not present

## 2023-09-03 DIAGNOSIS — N179 Acute kidney failure, unspecified: Secondary | ICD-10-CM | POA: Diagnosis not present

## 2023-09-03 DIAGNOSIS — N184 Chronic kidney disease, stage 4 (severe): Secondary | ICD-10-CM | POA: Diagnosis not present

## 2023-09-03 LAB — BASIC METABOLIC PANEL WITH GFR
Anion gap: 16 — ABNORMAL HIGH (ref 5–15)
BUN: 109 mg/dL — ABNORMAL HIGH (ref 8–23)
CO2: 21 mmol/L — ABNORMAL LOW (ref 22–32)
Calcium: 7.7 mg/dL — ABNORMAL LOW (ref 8.9–10.3)
Chloride: 97 mmol/L — ABNORMAL LOW (ref 98–111)
Creatinine, Ser: 5.77 mg/dL — ABNORMAL HIGH (ref 0.61–1.24)
GFR, Estimated: 10 mL/min — ABNORMAL LOW (ref >60)
Glucose, Bld: 181 mg/dL — ABNORMAL HIGH (ref 70–99)
Potassium: 3.1 mmol/L — ABNORMAL LOW (ref 3.5–5.1)
Sodium: 134 mmol/L — ABNORMAL LOW (ref 135–145)

## 2023-09-03 LAB — CBC
HCT: 21.2 % — ABNORMAL LOW (ref 39.0–52.0)
Hemoglobin: 6.9 g/dL — CL (ref 13.0–17.0)
MCH: 33.7 pg (ref 26.0–34.0)
MCHC: 32.5 g/dL (ref 30.0–36.0)
MCV: 103.4 fL — ABNORMAL HIGH (ref 80.0–100.0)
Platelets: 200 10*3/uL (ref 150–400)
RBC: 2.05 MIL/uL — ABNORMAL LOW (ref 4.22–5.81)
RDW: 14.2 % (ref 11.5–15.5)
WBC: 12.6 10*3/uL — ABNORMAL HIGH (ref 4.0–10.5)
nRBC: 0 % (ref 0.0–0.2)

## 2023-09-03 LAB — PREPARE RBC (CROSSMATCH)

## 2023-09-03 LAB — HEMOGLOBIN AND HEMATOCRIT, BLOOD
HCT: 29.3 % — ABNORMAL LOW (ref 39.0–52.0)
Hemoglobin: 10.1 g/dL — ABNORMAL LOW (ref 13.0–17.0)

## 2023-09-03 LAB — GLOBAL TEG PANEL
CFF Max Amplitude: 49.4 mm — ABNORMAL HIGH (ref 15–32)
CK with Heparinase (R): 8.5 min — ABNORMAL HIGH (ref 4.3–8.3)
Citrated Functional Fibrinogen: 901.5 mg/dL — ABNORMAL HIGH (ref 278–581)
Citrated Kaolin (K): 0.8 min (ref 0.8–2.1)
Citrated Kaolin (MA): 71.9 mm — ABNORMAL HIGH (ref 52–69)
Citrated Kaolin (R): 7 min (ref 4.6–9.1)
Citrated Kaolin Angle: 79.9 deg — ABNORMAL HIGH (ref 63–78)
Citrated Rapid TEG (MA): 72.6 mm — ABNORMAL HIGH (ref 52–70)

## 2023-09-03 LAB — GLUCOSE, CAPILLARY
Glucose-Capillary: 103 mg/dL — ABNORMAL HIGH (ref 70–99)
Glucose-Capillary: 123 mg/dL — ABNORMAL HIGH (ref 70–99)
Glucose-Capillary: 131 mg/dL — ABNORMAL HIGH (ref 70–99)
Glucose-Capillary: 51 mg/dL — ABNORMAL LOW (ref 70–99)
Glucose-Capillary: 62 mg/dL — ABNORMAL LOW (ref 70–99)
Glucose-Capillary: 65 mg/dL — ABNORMAL LOW (ref 70–99)
Glucose-Capillary: 98 mg/dL (ref 70–99)

## 2023-09-03 MED ORDER — SODIUM CHLORIDE 0.9% IV SOLUTION: Freq: Once | INTRAVENOUS | Status: AC

## 2023-09-03 MED ORDER — POTASSIUM CHLORIDE 10 MEQ/100ML IV SOLN
10.0000 meq | INTRAVENOUS | Status: AC
  Administered 2023-09-03 (×6): 10 meq via INTRAVENOUS
  Filled 2023-09-03 (×6): qty 100

## 2023-09-03 MED ORDER — SIMETHICONE 40 MG/0.6ML PO SUSP
40.0000 mg | Freq: Four times a day (QID) | ORAL | Status: DC | PRN
  Administered 2023-09-05 – 2023-09-28 (×3): 40 mg via ORAL
  Filled 2023-09-03 (×6): qty 0.6

## 2023-09-03 MED ORDER — DEXTROSE-SODIUM CHLORIDE 5-0.9 % IV SOLN: INTRAVENOUS | Status: AC

## 2023-09-03 NOTE — Progress Notes (Signed)
 Progress Note    Christian [name]   ZOX:[id number]  DOB: 05/19/1945  DOA: 08/30/2023     3 PCP: [name], MD  Initial CC: Right lower quadrant abdominal pain  Hospital Course: Mr. 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  Imaging confirmed acute appendicitis.  General surgery was consulted and cardiology was consulted to assist with preoperative cardiac clearance.   He underwent laparoscopic appendectomy on 08/31/2023.  Appendicitis with possible periappendiceal abscess - s/p lap appy 5/6 - continue zosyn  x 5 days post op (or as per surgery if further modification) - drain remains in place with bloody fluid noted - some drop in Hgb; likely multifactorial of hemodilution but also had plavix  prior to surgery so suspect some component of blood loss and stabilizing - CTA A/P obtained 5/8 and negative for bleeding and appears to be postop changes otherwise - see below for ABLA - continue trending H/H  ABLA - As noted above, possibly component of hemodilution with some blood loss with recent surgery and postop with oozing into bulb -CT angio was negative for active bleeding - still drop in Hgb this am, down to 6.9 g/dL.  Transfusing 2 units -Continue trending - follow up TEG panel  HTN - BP more stable now - hold home meds  Acute on CKDIV - patient has history of CKD4. Baseline creat ~ 3.2 - 3.5, eGFR~ 17 - possibly volume depletion from surgery/blood loss - at risk for ATN - creat uptrend to 5.77 today; no indication for HD at this time; no worrisome renal findings on CTA - started on IVF 5/7; will monitor response - if continues to rise and warrants potential HD, will consult nephrology at that time   DMII - Continue SSI and CBG monitoring  CAD - s/p CABG - cleared by  cardiology prior to surgery  Interval History:  Ongoing abdominal pain.  He states every time he breathes his abdomen hurts.  Definitely appears more uncomfortable this morning. Ongoing serosanguineous drainage in bulb.  Blood pressure remains normal.   Old records reviewed in assessment of this patient  Antimicrobials: Zosyn  08/30/2023 >> current  DVT prophylaxis:  Place and maintain sequential compression device Start: 09/01/23 2001 SCDs Start: 08/31/23 0415   Code Status:   Code Status: Full Code  Mobility Assessment (Last 72 Hours)     Mobility Assessment     Row Name 09/03/23 0849 09/02/23 2100 09/02/23 1533 09/02/23 1130 09/01/23 1947   Does patient have an order for bedrest or is patient medically unstable No - Continue assessment No - Continue assessment -- No - Continue assessment No - Continue assessment   What is the highest level of mobility based on the progressive mobility assessment? Level 1 (Bedfast) - Unable to balance while sitting on edge of bed Level 1 (Bedfast) - Unable to balance while sitting on edge of bed Level 1 (Bedfast) - Unable to balance while sitting on edge of bed Level 3 (Stands with assist) - Balance while standing  and cannot march in place Level 3 (Stands with assist) - Balance while standing  and cannot march in place   Is the above level different from baseline mobility prior to current illness? Yes - Recommend PT order -- -- Yes - Recommend PT order Yes - Recommend PT order    Row Name 09/01/23  4742 09/01/23 0200 08/31/23 2100       Does patient have an order for bedrest or is patient medically unstable No - Continue assessment No - Continue assessment No - Continue assessment     What is the highest level of mobility based on the progressive mobility assessment? Level 3 (Stands with assist) - Balance while standing  and cannot march in place Level 3 (Stands with assist) - Balance while standing  and cannot march in place Level 3 (Stands with  assist) - Balance while standing  and cannot march in place     Is the above level different from baseline mobility prior to current illness? Yes - Recommend PT order Yes - Recommend PT order Yes - Recommend PT order              Barriers to discharge: None Disposition Plan: Home HH orders placed:  Status is: Inpatient  Objective: Blood pressure 126/70, pulse 88, temperature 98.3 F (36.8 C), temperature source Oral, resp. rate 16, height 5\' 9"  (1.753 m), weight 101.6 kg, SpO2 97%.  Examination:  Physical Exam Constitutional:      General: He is not in acute distress.    Appearance: Normal appearance. He is obese.  HENT:     Head: Normocephalic and atraumatic.     Mouth/Throat:     Mouth: Mucous membranes are moist.  Eyes:     Extraocular Movements: Extraocular movements intact.  Cardiovascular:     Rate and Rhythm: Normal rate and regular rhythm.  Pulmonary:     Effort: Pulmonary effort is normal. No respiratory distress.     Breath sounds: Normal breath sounds. No wheezing.  Abdominal:     General: Abdomen is protuberant. Bowel sounds are decreased. There is no distension.     Palpations: Abdomen is soft.     Tenderness: There is abdominal tenderness.     Comments: Surgical drain in place with serosanguineous fluid noted  Musculoskeletal:        General: Normal range of motion.     Cervical back: Normal range of motion and neck supple.  Skin:    General: Skin is warm and dry.  Neurological:     General: No focal deficit present.     Mental Status: He is alert.  Psychiatric:        Mood and Affect: Mood normal.        Behavior: Behavior normal.      Consultants:  General surgery  Procedures:  08/31/2023: Laparoscopic appendectomy  Data Reviewed: Results for orders placed or performed during the hospital encounter of 08/30/23 (from the past 24 hours)  Glucose, capillary     Status: None   Collection Time: 09/02/23  5:12 PM  Result Value Ref Range    Glucose-Capillary 79 70 - 99 mg/dL  Hemoglobin and hematocrit, blood     Status: Abnormal   Collection Time: 09/02/23  5:14 PM  Result Value Ref Range   Hemoglobin 7.6 (L) 13.0 - 17.0 g/dL   HCT 59.5 (L) 63.8 - 75.6 %  Glucose, capillary     Status: Abnormal   Collection Time: 09/02/23  8:37 PM  Result Value Ref Range   Glucose-Capillary 67 (L) 70 - 99 mg/dL  Glucose, capillary     Status: Abnormal   Collection Time: 09/02/23  9:38 PM  Result Value Ref Range   Glucose-Capillary 113 (H) 70 - 99 mg/dL  Glucose, capillary     Status: Abnormal   Collection Time: 09/03/23  5:01 AM  Result Value Ref Range   Glucose-Capillary 65 (L) 70 - 99 mg/dL  Glucose, capillary     Status: Abnormal   Collection Time: 09/03/23  5:29 AM  Result Value Ref Range   Glucose-Capillary 123 (H) 70 - 99 mg/dL  CBC     Status: Abnormal   Collection Time: 09/03/23  7:13 AM  Result Value Ref Range   WBC 12.6 (H) 4.0 - 10.5 K/uL   RBC 2.05 (L) 4.22 - 5.81 MIL/uL   Hemoglobin 6.9 (LL) 13.0 - 17.0 g/dL   HCT 11.9 (L) 14.7 - 82.9 %   MCV 103.4 (H) 80.0 - 100.0 fL   MCH 33.7 26.0 - 34.0 pg   MCHC 32.5 30.0 - 36.0 g/dL   RDW 56.2 13.0 - 86.5 %   Platelets 200 150 - 400 K/uL   nRBC 0.0 0.0 - 0.2 %  Basic metabolic panel with GFR     Status: Abnormal   Collection Time: 09/03/23  7:13 AM  Result Value Ref Range   Sodium 134 (L) 135 - 145 mmol/L   Potassium 3.1 (L) 3.5 - 5.1 mmol/L   Chloride 97 (L) 98 - 111 mmol/L   CO2 21 (L) 22 - 32 mmol/L   Glucose, Bld 181 (H) 70 - 99 mg/dL   BUN 784 (H) 8 - 23 mg/dL   Creatinine, Ser 6.96 (H) 0.61 - 1.24 mg/dL   Calcium  7.7 (L) 8.9 - 10.3 mg/dL   GFR, Estimated 10 (L) >60 mL/min   Anion gap 16 (H) 5 - 15  Prepare RBC (crossmatch)     Status: None   Collection Time: 09/03/23  8:51 AM  Result Value Ref Range   Order Confirmation      ORDER PROCESSED BY BLOOD BANK Performed at [facility], [address], [place], [place] [address]   Glucose, capillary      Status: Abnormal   Collection Time: 09/03/23  8:54 AM  Result Value Ref Range   Glucose-Capillary 131 (H) 70 - 99 mg/dL  Glucose, capillary     Status: None   Collection Time: 09/03/23 12:10 PM  Result Value Ref Range   Glucose-Capillary 98 70 - 99 mg/dL    I have reviewed pertinent nursing notes, vitals, labs, and images as necessary. I have ordered labwork to follow up on as indicated.  I have reviewed the last notes from staff over past 24 hours. I have discussed patient's care plan and test results with nursing staff, CM/SW, and other staff as appropriate.  Time spent: Greater than 50% of the 55 minute visit was spent in counseling/coordination of care for the patient as laid out in the A&P.   LOS: 3 days   Faith Homes, MD [facility] 09/03/2023, 4:22 PM

## 2023-09-03 NOTE — NC FL2 (Signed)
 [place]  MEDICAID FL2 LEVEL OF CARE FORM     IDENTIFICATION  Patient Name: Christian [name] Birthdate: [birth year] Sex: male Admission Date (Current Location): 08/30/2023  [facility] [place] Number:  Producer, television/film/video and Address:  [facility],  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 06/09/2023   Diabetic polyneuropathy    CHF (congestive heart failure)    Neck swelling 05/16/2021   Atypical pneumonia 12/12/2020   Pulmonary edema 10/03/2020   Sleep difficulties 05/30/2020   ABLA (acute blood loss anemia) 04/24/2020   Acute renal failure superimposed on stage 4 chronic kidney disease (HCC) 03/27/2020   Aortic atherosclerosis 03/27/2020   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   Pruritus 11/28/2019   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   Ischemic cardiomyopathy    History of coronary artery stent placement    History of pulmonary embolism 08/09/2019   Elevated liver enzymes 08/09/2019   NSTEMI (non-ST elevation myocardial infarction) 08/09/2019   Left leg weakness 11/23/2018   CAD (coronary artery disease) 07/30/2018   Type II diabetes mellitus 10/10/2014   Benign prostatic hyperplasia 09/27/2014   Hyperlipidemia associated with type 2 diabetes mellitus 11/12/2009   Multiple sclerosis 11/12/2009   HTN (hypertension) 11/12/2009   Shortness of breath 11/12/2009    Orientation RESPIRATION BLADDER Height & Weight     Self, Time, Situation, Place  Normal Incontinent  Weight: 101.6 kg Height:  5\' 9"  (175.3 cm)  BEHAVIORAL SYMPTOMS/MOOD NEUROLOGICAL BOWEL NUTRITION STATUS      Continent Diet (See discharge summary)  AMBULATORY STATUS COMMUNICATION OF NEEDS Skin   Total Care Verbally Surgical wounds                       Personal Care Assistance Level of Assistance  Bathing, Feeding, Dressing Bathing Assistance: Maximum assistance Feeding assistance: Limited assistance Dressing Assistance: Maximum assistance     Functional Limitations Info  Sight, Hearing, Speech Sight Info: Impaired Hearing Info: Adequate Speech Info: Adequate    SPECIAL CARE FACTORS FREQUENCY  PT (By licensed PT), OT (By licensed OT)     PT Frequency: 5 x per week OT Frequency: 5 x per week            Contractures Contractures Info: Not present    Additional Factors Info  Code Status, Allergies Code Status Info: Full Code Allergies Info: NKDA           Current Medications (09/03/2023):  This is the current hospital active medication list Current Facility-Administered Medications  Medication Dose Route Frequency Provider Last Rate Last Admin   acetaminophen  (TYLENOL ) tablet 1,000 mg  1,000 mg Oral Q6H Marlin Simmonds, PA-C   1,000 mg at 09/03/23 1107   albuterol  (PROVENTIL ) (2.5 MG/3ML) 0.083% nebulizer solution 2.5 mg  2.5 mg Nebulization Q4H PRN Marlin Simmonds, PA-C       dextrose  5 %-0.9 % sodium chloride  infusion   Intravenous Continuous Rathore, Vasundhra, MD 100 mL/hr at 09/03/23 2595 New  Bag at 09/03/23 0616   dextrose  50 % solution 12.5 g  12.5 g Intravenous PRN Juliette Oh, MD   12.5 g at 09/03/23 0505   fluticasone  furoate-vilanterol (BREO ELLIPTA ) 200-25 MCG/ACT 1 puff  1 puff Inhalation Daily Marlin Simmonds, PA-C   1 puff at 09/02/23 9147   hydrALAZINE  (APRESOLINE ) injection 10 mg  10 mg Intravenous Q4H PRN Marlin Simmonds, PA-C       LORazepam  (ATIVAN ) injection 0.5 mg  0.5 mg Intravenous Q4H PRN Marlin Simmonds, PA-C   0.5 mg at 09/01/23 0612    methocarbamol  (ROBAXIN ) tablet 1,000 mg  1,000 mg Oral Q8H Anda Bamberg, MD   1,000 mg at 09/03/23 0656   morphine  (PF) 2 MG/ML injection 2 mg  2 mg Intravenous Q3H PRN Lovick, Ayesha N, MD   2 mg at 09/03/23 1215   ondansetron  (ZOFRAN ) injection 4 mg  4 mg Intravenous Q6H PRN Marlin Simmonds, PA-C   4 mg at 09/01/23 8295   piperacillin -tazobactam (ZOSYN ) IVPB 2.25 g  2.25 g Intravenous Q8H Dang, Thuy D, RPH 100 mL/hr at 09/03/23 0530 2.25 g at 09/03/23 0530   potassium chloride  10 mEq in 100 mL IVPB  10 mEq Intravenous Q1 Hr x 6 Marlin Simmonds, PA-C 100 mL/hr at 09/03/23 1421 10 mEq at 09/03/23 1421   simethicone  (MYLICON) 40 MG/0.6ML suspension 40 mg  40 mg Oral QID PRN Marlin Simmonds, PA-C       traMADol  (ULTRAM ) tablet 50 mg  50 mg Oral Q4H PRN Lovick, Ayesha N, MD   50 mg at 09/03/23 1107     Discharge Medications: Please see discharge summary for a list of discharge medications.  Relevant Imaging Results:  Relevant Lab Results:   Additional Information [id number]  Dane Dung, RN

## 2023-09-03 NOTE — TOC Initial Note (Signed)
 Transition of Care [facility]) - Initial/Assessment Note    Patient Details  Name: Christian [name] MRN: [id number] Date of Birth: 24-May-1945  [facility]) CM/SW Contact:    Abb Gobert A [place], LCSW Phone Number: 09/03/2023, 2:33 PM  Clinical Narrative:                  CSW met with pt at bedside. CSW spoke with pt's wife Christian [name] and family friend at bedside while pt was resting.  Discussed recommendation for rehab, was agreeable to referral for SNF.  Continuing to wait on pt's medical course. No preference, has been to [facility] in the past.  SNF workup completed. Bed offers pending.     TOC will continue to follow.   Expected Discharge Plan: Skilled Nursing Facility     Patient Goals and CMS Choice            Expected Discharge Plan and Services                                              Prior Living Arrangements/Services              Need for Family Participation in Patient Care: Yes (Comment) Care giver support system in place?: Yes (comment) (pt's wife, Christian [name])      Activities of Daily Living   ADL Screening (condition at time of admission) Independently performs ADLs?: Yes (appropriate for developmental age) Is the patient deaf or have difficulty hearing?: Yes Does the patient have difficulty seeing, even when wearing glasses/contacts?: Yes Does the patient have difficulty concentrating, remembering, or making decisions?: Yes  Permission Sought/Granted                  Emotional Assessment Appearance:: Disheveled Attitude/Demeanor/Rapport: Other (comment) (sleeping) Affect (typically observed): Quiet Orientation: : Oriented to Self, Oriented to Place Alcohol / Substance Use: Not Applicable Psych Involvement: No (comment)  Admission diagnosis:  Appendicitis [K37] Acute appendicitis [K35.80] Acute appendicitis with localized peritonitis, unspecified whether abscess present, unspecified whether gangrene present,  unspecified whether perforation present [K35.30] Patient Active Problem List   Diagnosis Date Noted   Appendicitis 08/31/2023   Mild dementia, 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 06/09/2023   Diabetic polyneuropathy    CHF (congestive heart failure)    Neck swelling 05/16/2021   Atypical pneumonia 12/12/2020   Pulmonary edema 10/03/2020   Sleep difficulties 05/30/2020   ABLA (acute blood loss anemia) 04/24/2020   Acute renal failure superimposed on stage 4 chronic kidney disease (HCC) 03/27/2020   Aortic atherosclerosis 03/27/2020   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   Pruritus 11/28/2019   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   Ischemic cardiomyopathy    History of coronary artery stent placement    History of pulmonary embolism 08/09/2019   Elevated liver enzymes 08/09/2019   NSTEMI (non-ST elevation myocardial infarction) 08/09/2019   Left leg weakness 11/23/2018   CAD (coronary artery disease) 07/30/2018   Type II diabetes mellitus 10/10/2014   Benign prostatic hyperplasia 09/27/2014   Hyperlipidemia associated with type 2 diabetes mellitus 11/12/2009   Multiple sclerosis 11/12/2009   HTN (hypertension) 11/12/2009   Shortness of breath 11/12/2009   PCP:  [name], MD Pharmacy:   [facility] [phone] - [place], [place] - [address] AT [address] OF [address] [place] [address]  Phone: [phone] Fax: [phone]  [facility] [address] [place] [address] Phone: [phone] Fax: [phone]     Social Drivers of Health (SDOH) Social History: SDOH Screenings   Food Insecurity: No Food Insecurity (08/31/2023)  Housing: Low Risk  (08/31/2023)  Transportation Needs: No Transportation Needs (08/31/2023)  Utilities: Not At Risk (08/31/2023)  Alcohol Screen: Low Risk  (07/23/2021)  Depression (PHQ2-9): Low Risk  (01/18/2023)  Financial Resource Strain: Low Risk  (07/23/2021)  Physical  Activity: Inactive (07/23/2021)  Social Connections: Moderately Integrated (08/31/2023)  Stress: No Stress Concern Present (07/23/2021)  Tobacco Use: Medium Risk (08/31/2023)   SDOH Interventions:     Readmission Risk Interventions     No data to display

## 2023-09-03 NOTE — TOC Progression Note (Signed)
 Transition of Care [facility]) - Progression Note    Patient Details  Name: Christian [name] MRN: [id number] Date of Birth: [birth year]  [facility] CM/SW Contact  Dane Dung, RN Phone Number: 09/03/2023, 3:18 PM  Clinical Narrative:    CM spoke with [place], MSW and SNF workup started and patient faxed out in the hub for available bed offers.  MSW to follow up with patient/family regarding bed offers when available over the weekend.   Expected Discharge Plan: Skilled Nursing Facility    Expected Discharge Plan and Services                                               Social Determinants of Health (SDOH) Interventions SDOH Screenings   Food Insecurity: No Food Insecurity (08/31/2023)  Housing: Low Risk  (08/31/2023)  Transportation Needs: No Transportation Needs (08/31/2023)  Utilities: Not At Risk (08/31/2023)  Alcohol Screen: Low Risk  (07/23/2021)  Depression (PHQ2-9): Low Risk  (01/18/2023)  Financial Resource Strain: Low Risk  (07/23/2021)  Physical Activity: Inactive (07/23/2021)  Social Connections: Moderately Integrated (08/31/2023)  Stress: No Stress Concern Present (07/23/2021)  Tobacco Use: Medium Risk (08/31/2023)    Readmission Risk Interventions     No data to display

## 2023-09-03 NOTE — Anesthesia Postprocedure Evaluation (Signed)
 Anesthesia Post Note  Patient: Christian [name]  Procedure(s) Performed: APPENDECTOMY, LAPAROSCOPIC (Abdomen)     Patient location during evaluation: PACU Anesthesia Type: General Level of consciousness: awake and alert Pain management: pain level controlled Vital Signs Assessment: post-procedure vital signs reviewed and stable Respiratory status: spontaneous breathing, nonlabored ventilation, respiratory function stable and patient connected to nasal cannula oxygen Cardiovascular status: blood pressure returned to baseline and stable Postop Assessment: no apparent nausea or vomiting Anesthetic complications: no   No notable events documented.               Kynsli Haapala

## 2023-09-03 NOTE — Progress Notes (Signed)
 3 Days Post-Op  Subjective: Hgb 6.9 today, getting ready to get blood.  Vitals stable.  C/o pain in his RLQ and distention.  No nausea right now.  Objective: Vital signs in last 24 hours: Temp:  [97.8 F (36.6 C)-98.8 F (37.1 C)] 98 F (36.7 C) (05/09 0849) Pulse Rate:  [96-108] 96 (05/09 0849) Resp:  [18-19] 18 (05/09 0456) BP: (117-134)/(57-75) 117/58 (05/09 0849) SpO2:  [98 %-100 %] 98 % (05/09 0849) Last BM Date : 09/01/23  Intake/Output from previous day: 05/08 0701 - 05/09 0700 In: 448.4 [I.V.:448.4] Out: 1025 [Urine:500; Drains:525] Intake/Output this shift: Total I/O In: -  Out: 75 [Drains:75]  PE: Gen: NAD Heart: regular Lungs: CTAB Abd: soft, moderate distention, JP drain is bloody still but more mix of serous fluid today than yesterday.  525cc total yesterday.  I just emptied 75cc. Lab Results:  Recent Labs    09/02/23 0112 09/02/23 1714 09/03/23 0713  WBC 16.2*  --  12.6*  HGB 9.1* 7.6* 6.9*  HCT 27.9* 23.7* 21.2*  PLT 207  --  200   BMET Recent Labs    09/02/23 0112 09/03/23 0713  NA 135 134*  K 3.5 3.1*  CL 97* 97*  CO2 22 21*  GLUCOSE 162* 181*  BUN 115* 109*  CREATININE 5.33* 5.77*  CALCIUM  8.2* 7.7*   PT/INR No results for input(s): "LABPROT", "INR" in the last 72 hours. CMP     Component Value Date/Time   NA 134 (L) 09/03/2023 0713   NA 143 04/16/2022 0000   K 3.1 (L) 09/03/2023 0713   CL 97 (L) 09/03/2023 0713   CO2 21 (L) 09/03/2023 0713   GLUCOSE 181 (H) 09/03/2023 0713   BUN 109 (H) 09/03/2023 0713   BUN 35 (H) 05/22/2021 1551   CREATININE 5.77 (H) 09/03/2023 0713   CREATININE 2.87 (H) 12/20/2019 1312   CALCIUM  7.7 (L) 09/03/2023 0713   PROT 6.7 09/01/2023 0617   PROT 6.9 05/22/2021 1551   ALBUMIN  2.7 (L) 09/01/2023 0617   ALBUMIN  4.5 05/22/2021 1551   AST 17 09/01/2023 0617   ALT 17 09/01/2023 0617   ALKPHOS 55 09/01/2023 0617   BILITOT 1.0 09/01/2023 0617   BILITOT 0.2 05/22/2021 1551   GFRNONAA 10 (L)  09/03/2023 0713   GFRAA 19 (L) 10/18/2019 0046   Lipase     Component Value Date/Time   LIPASE 28 08/30/2023 1727       Studies/Results: DG Abd 1 View Result Date: 09/03/2023 CLINICAL DATA:  [id number] Abdominal distension [id number] EXAM: ABDOMEN - 1 VIEW COMPARISON:  09/30/2019. FINDINGS: There is increased amount of gas throughout the small bowel and colon, without transition point, likely sequela of adynamic ileus however, early partial small bowel obstruction can also have similar appearance. Consider radiographic follow up if clinically indicated. No evidence of pneumoperitoneum. No acute osseous abnormalities. There is blunting of left lateral costophrenic angle, suggesting small left pleural effusion. The soft tissues are otherwise within normal limits. Surgical changes, devices, tubes and lines: Metallic hardware noted overlying the midthoracic spine region. IMPRESSION: *There is increased amount of gas throughout the small bowel and colon, without transition point, likely sequela of adynamic ileus however, early partial small bowel obstruction can also have similar appearance. Consider radiographic follow up if clinically indicated. Electronically Signed   By: Beula Brunswick M.D.   On: 09/03/2023 08:25   CT Angio Abd/Pel w/ and/or w/o Result Date: 09/02/2023 CLINICAL DATA:  Status post laparoscopic appendectomy 08/31/2023. Evaluate for  intra-abdominal bleeding. EXAM: CTA ABDOMEN AND PELVIS WITHOUT AND WITH CONTRAST TECHNIQUE: Multidetector CT imaging of the abdomen and pelvis was performed using the standard protocol during bolus administration of intravenous contrast. Multiplanar reconstructed images and MIPs were obtained and reviewed to evaluate the vascular anatomy. RADIATION DOSE REDUCTION: This exam was performed according to the departmental dose-optimization program which includes automated exposure control, adjustment of the mA and/or kV according to patient size and/or use of iterative  reconstruction technique. CONTRAST:  OMNIPAQUE  IOHEXOL  350 MG/ML SOLN COMPARISON:  Abdominopelvic CT 08/30/2023 and 09/28/2019. Chest CT 12/12/2020. FINDINGS: VASCULAR Aorta: Aortic atherosclerosis. Normal caliber aorta without aneurysm, dissection or significant stenosis. Celiac: Patent without evidence of aneurysm, dissection or significant stenosis. SMA: Patent without evidence of aneurysm, dissection or significant stenosis. 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 Renal vascular calcifications bilaterally without evidence of aneurysm or large vessel occlusion. IMA: Patent without evidence of aneurysm, dissection, vasculitis or significant stenosis. Inflow: Iliac atherosclerosis. Patent without evidence of aneurysm, dissection, vasculitis or significant stenosis. Veins: No significant venous abnormalities. The portal, superior mesenteric and splenic veins are patent. The IVC, renal veins and iliac veins are patent. Review of the MIP images confirms the above findings. NON-VASCULAR FINDINGS Lower chest: Pleural thickening and effusions with calcifications bilaterally. Stable chronic rounded atelectasis at the left lung base. Hepatobiliary: The liver is normal in density without suspicious focal abnormality. No evidence of gallstones, gallbladder wall thickening or biliary dilatation. Pancreas: Unremarkable. No pancreatic ductal dilatation or surrounding inflammatory changes. Spleen: Normal in size without suspicious focal abnormality. There is a small cystic lesion medially. Adrenals/Urinary Tract: Unchanged low-density nodular thickening of both adrenal glands consistent with adrenal hyperplasia. The kidneys appear normal without evidence of urinary tract calculus, suspicious lesion or hydronephrosis. Bilateral renal cysts for which no specific follow-up imaging is recommended. A small amount of air in the bladder lumen is likely iatrogenic. Stomach/Bowel: No enteric contrast was administered. The  stomach appears unremarkable for its degree of distention. Mild small bowel distension in the central abdomen, likely a mild postoperative ileus. Postsurgical changes related to interval appendectomy. There is a surgical drain with its tip in the right lower quadrant. Ill-defined fluid collections and inflammation in the right lower quadrant, further described below. No focal colonic abnormalities are identified. There are diverticular changes within the descending and sigmoid colon. No evidence of active GI bleeding. Lymphatic: There are no enlarged abdominal or pelvic lymph nodes. Reproductive: The prostate gland and seminal vesicles appear normal. Other: Left-sided surgical drain tip is in the right lower quadrant. Right lower quadrant inflammatory changes with ill-defined fluid and small extraluminal air bubbles related to interval appendectomy. No organized fluid collection identified. There is a small amount of low-density ascites. No evidence of active intra-abdominal bleeding. There is a small amount of gas within a chronic hernia through the lower sternotomy defect, likely postsurgical. No pneumoperitoneum. Musculoskeletal: No acute or significant osseous findings. Multilevel spondylosis. Previous median sternotomy with chronic midline defect in the sternotomy and herniation of upper abdominal fat, unchanged from previous chest CT. Mild bilateral gynecomastia. Review of the MIP images confirms the above findings. IMPRESSION: 1. No evidence of active intra-abdominal bleeding. 2. Postsurgical changes related to interval appendectomy. There is a surgical drain with its tip in the right lower quadrant. There is ill-defined fluid and small extraluminal air bubbles in the right lower quadrant, likely postsurgical. No organized fluid collection identified. 3. Mild small bowel distension in the central abdomen, likely a mild postoperative ileus. 4.  Chronic pleural thickening and effusions with calcifications  bilaterally. Stable chronic rounded atelectasis at the left lung base. 5. Aortic Atherosclerosis (ICD10-I70.0). No acute vascular findings demonstrated. Electronically Signed   By: Elmon Hagedorn M.D.   On: 09/02/2023 11:28     Anti-infectives: Anti-infectives (From admission, onward)    Start     Dose/Rate Route Frequency Ordered Stop   08/31/23 2200  piperacillin -tazobactam (ZOSYN ) IVPB 2.25 g        2.25 g 100 mL/hr over 30 Minutes Intravenous Every 8 hours 08/31/23 1953     08/31/23 0600  piperacillin -tazobactam (ZOSYN ) IVPB 3.375 g  Status:  Discontinued        3.375 g 100 mL/hr over 30 Minutes Intravenous Every 8 hours 08/31/23 0532 08/31/23 1953   08/30/23 2245  piperacillin -tazobactam (ZOSYN ) IVPB 3.375 g        3.375 g 100 mL/hr over 30 Minutes Intravenous  Once 08/30/23 2233 08/31/23 0005        Assessment/Plan POD 3, s/p lap appy with JP drain placement for perforated appendicitis [name] 5/6  -NPO for ileus -JP is bloody, but with slight increase in thin/serous mixture today.  CTA negative for active bleeding.  Suspect this is ooze secondary to his plavix .  Hgb down to 6.9 this am.  Getting transfused. -WBC down to 12K, AF.  Cont zosyn  due to perforation, likely 5 days post op -mobilize, PT eval -continue to closely monitor   FEN - NPO, IVFs, replace K today VTE - heparin /plavix  on hold ID - Zosyn , may renally adjust per pharmacy  CKD - cr up to 5.77.   ABL anemia - hgb down to 6.9.  getting blood.  Cont to closely monitor cbc.  CAD/CHF - continue to hold plavix  for now HTN DM    LOS: 3 days    Leone Ralphs , [facility] Surgery 09/03/2023, 9:25 AM Please see Amion for pager number during day hours 7:00am-4:30pm or 7:00am -11:30am on weekends

## 2023-09-03 NOTE — Plan of Care (Signed)

## 2023-09-03 NOTE — Progress Notes (Signed)
 OT Cancellation Note  Patient Details Name: Christian [name] MRN: [id number] DOB: [birth year]   Cancelled Treatment:    Reason Eval/Treat Not Completed: Medical issues which prohibited therapy. Pt  hgb is 6.9 and pt waiting on blood. Will attempt back as schedule allows.   Carla Charon 09/03/2023, 9:14 AM

## 2023-09-04 DIAGNOSIS — N179 Acute kidney failure, unspecified: Secondary | ICD-10-CM | POA: Diagnosis not present

## 2023-09-04 DIAGNOSIS — D62 Acute posthemorrhagic anemia: Secondary | ICD-10-CM | POA: Diagnosis not present

## 2023-09-04 DIAGNOSIS — N184 Chronic kidney disease, stage 4 (severe): Secondary | ICD-10-CM | POA: Diagnosis not present

## 2023-09-04 DIAGNOSIS — K37 Unspecified appendicitis: Secondary | ICD-10-CM | POA: Diagnosis not present

## 2023-09-04 LAB — CBC
HCT: 28.5 % — ABNORMAL LOW (ref 39.0–52.0)
Hemoglobin: 9.7 g/dL — ABNORMAL LOW (ref 13.0–17.0)
MCH: 32.8 pg (ref 26.0–34.0)
MCHC: 34 g/dL (ref 30.0–36.0)
MCV: 96.3 fL (ref 80.0–100.0)
Platelets: 205 10*3/uL (ref 150–400)
RBC: 2.96 MIL/uL — ABNORMAL LOW (ref 4.22–5.81)
RDW: 17.3 % — ABNORMAL HIGH (ref 11.5–15.5)
WBC: 12.4 10*3/uL — ABNORMAL HIGH (ref 4.0–10.5)
nRBC: 0.2 % (ref 0.0–0.2)

## 2023-09-04 LAB — GLUCOSE, CAPILLARY
Glucose-Capillary: 109 mg/dL — ABNORMAL HIGH (ref 70–99)
Glucose-Capillary: 111 mg/dL — ABNORMAL HIGH (ref 70–99)
Glucose-Capillary: 145 mg/dL — ABNORMAL HIGH (ref 70–99)
Glucose-Capillary: 64 mg/dL — ABNORMAL LOW (ref 70–99)
Glucose-Capillary: 81 mg/dL (ref 70–99)
Glucose-Capillary: 83 mg/dL (ref 70–99)
Glucose-Capillary: 91 mg/dL (ref 70–99)
Glucose-Capillary: 94 mg/dL (ref 70–99)

## 2023-09-04 LAB — BPAM RBC
Blood Product Expiration Date: 202506082359
Blood Product Expiration Date: 202506082359
ISSUE DATE / TIME: 202505090937
ISSUE DATE / TIME: 202505091201
Unit Type and Rh: 5100
Unit Type and Rh: 5100

## 2023-09-04 LAB — TYPE AND SCREEN
ABO/RH(D): O POS
Antibody Screen: NEGATIVE
Unit division: 0
Unit division: 0

## 2023-09-04 LAB — BASIC METABOLIC PANEL WITH GFR
Anion gap: 16 — ABNORMAL HIGH (ref 5–15)
BUN: 114 mg/dL — ABNORMAL HIGH (ref 8–23)
CO2: 20 mmol/L — ABNORMAL LOW (ref 22–32)
Calcium: 7.5 mg/dL — ABNORMAL LOW (ref 8.9–10.3)
Chloride: 98 mmol/L (ref 98–111)
Creatinine, Ser: 6.63 mg/dL — ABNORMAL HIGH (ref 0.61–1.24)
GFR, Estimated: 8 mL/min — ABNORMAL LOW (ref >60)
Glucose, Bld: 88 mg/dL (ref 70–99)
Potassium: 3.6 mmol/L (ref 3.5–5.1)
Sodium: 134 mmol/L — ABNORMAL LOW (ref 135–145)

## 2023-09-04 LAB — MAGNESIUM: Magnesium: 2.4 mg/dL (ref 1.7–2.4)

## 2023-09-04 LAB — HEMOGLOBIN AND HEMATOCRIT, BLOOD
HCT: 33.7 % — ABNORMAL LOW (ref 39.0–52.0)
Hemoglobin: 11.2 g/dL — ABNORMAL LOW (ref 13.0–17.0)

## 2023-09-04 MED ORDER — DEXTROSE-SODIUM CHLORIDE 5-0.9 % IV SOLN: INTRAVENOUS | Status: AC

## 2023-09-04 MED ORDER — DEXTROSE-SODIUM CHLORIDE 5-0.9 % IV SOLN: INTRAVENOUS | Status: DC

## 2023-09-04 MED ORDER — SODIUM CHLORIDE 0.9 % IV SOLN: INTRAVENOUS | Status: AC

## 2023-09-04 NOTE — Consult Note (Signed)
 Renal Service Consult Note [facility]  Christian [name] 09/04/2023 [name], MD Requesting Physician: Dr. [name]  Reason for Consult: Renal failure HPI: The patient is a 78 y.o. year-old w/ PMH as below who presented with abdominal pain on 08/30/2023.  He was found to have a perforated appendix and went to the OR the morning of 08/31/2023 for appendectomy.  The patient has a history of CKD and was on dialysis up until about 1-1/2 years ago and has been off dialysis now with CKD 4.  Postoperatively on the 1st and 2nd day blood pressures were soft. The patient had a rapid response for blood pressure of 76/56 on 09/01/2023.  Patient was managed with fluid boluses, blood transfusions and IV antibiotics.  Blood pressures improved.  Creatinine however which was 3.5 on admission, has been climbing since then up to 6.6 today.  We are asked to see for renal failure.   Pt seen in room.  Patient is pleasant and fair historian.  He says his wife knows more of the details and she just left.  Patient is feeling okay, denies any confusion or jerking of the extremities or somnolence.  He is having persistent pain in the right lower quadrant otherwise no other complaints.  Urine output was 700 cc yesterday 275 cc the day before only 100 cc today.  Total I's and O's 7.5 L and 2.9 L out, which is 4.7 L net positive since admission.   ROS - denies CP, no joint pain, no HA, no blurry vision, no rash, no diarrhea, no nausea/ vomiting  PMH: Anemia due to CKD CAD CKD 4 History of hemodialysis History of coronary artery stent placement History of PE Hypertension HL Mild dementia History of CABG x 4 in 2021 Type 2 diabetes  Past Surgical History  Past Surgical History:  Procedure Laterality Date   A/V FISTULAGRAM Left 08/12/2022   Procedure: A/V Fistulagram;  Surgeon: Kayla Part, MD;  Location: [facility] INVASIVE CV LAB;  Service: Cardiovascular;  Laterality: Left;   APPLICATION OF WOUND  VAC N/A 08/24/2019   Procedure: APPLICATION OF WOUND VAC;  Surgeon: Rudine Cos, MD;  Location: [facility] OR;  Service: Thoracic;  Laterality: N/A;   APPLICATION OF WOUND VAC  08/29/2019   Procedure: Wound Vac change;  Surgeon: Rudine Cos, MD;  Location: [facility] OR;  Service: Open Heart Surgery;;   APPLICATION OF WOUND VAC N/A 09/04/2019   Procedure: Application Of Wound Vac;  Surgeon: Norita Beauvais, MD;  Location: [facility] OR;  Service: Open Heart Surgery;  Laterality: N/A;   APPLICATION OF WOUND VAC N/A 09/06/2019   Procedure: APPLICATION OF ACELL, APPLICATION OF WOUND VAC USING PREVENA INCISIONAL  DRESSING;  Surgeon: Thornell Flirt, DO;  Location: [facility] OR;  Service: Plastics;  Laterality: N/A;   AV FISTULA PLACEMENT Left 09/18/2020   Procedure: ARTERIOVENOUS (AV) FISTULA CREATION LEFT VERSUS GRAFT;  Surgeon: Margherita Shell, MD;  Location: [facility] OR;  Service: Vascular;  Laterality: Left;   BASCILIC VEIN TRANSPOSITION Left 10/31/2020   Procedure: LEFT SECOND STAGE BASCILIC VEIN TRANSPOSITION;  Surgeon: Margherita Shell, MD;  Location: [facility] OR;  Service: Vascular;  Laterality: Left;   CARDIAC CATHETERIZATION     CORONARY ARTERY BYPASS GRAFT N/A 08/15/2019   Procedure: CORONARY ARTERY BYPASS GRAFTING (CABG), x 4 using bilateral IMAs and left radial artery .  LIMA TO LAD, RIMA TO PDA, RADIAL ARTERY TO CIRC AND SEQUENTIALLY TO OM1.;  Surgeon: Rudine Cos, MD;  Location: [facility] OR;  Service: Open Heart Surgery;  Laterality: N/A;   CORONARY STENT PLACEMENT  02/27/2014   distal rt/pd coronary       dr Berry Bristol   CYSTO/ BLADDER BIOPSY'S/ CAUTHERIZATION  01-14-2004  DR Isla Mari   EXPLORATION POST OPERATIVE OPEN HEART N/A 08/16/2019   Procedure: Chest Closure S?P CABG WITH APPLICATION OF PREVENA  INCISIONAL WOUND VAC;  Surgeon: Rudine Cos, MD;  [facility]: [facility] OR;  Service: Open Heart Surgery;  Laterality: N/A;   EXPLORATION POST OPERATIVE OPEN HEART N/A 08/21/2019   Procedure: CHEST WASHOUT S/P OPEN CHEST;   Surgeon: Rudine Cos, MD;  [facility]: [facility];  Service: Open Heart Surgery;  Laterality: N/A;  Open chest with Esmark dressing with Ioban sealant coverage.   EXPLORATION POST OPERATIVE OPEN HEART N/A 08/18/2019   Procedure: EXPLORATION POST OPERATIVE OPEN HEART (performed 04/23 on unit);  Surgeon: Rudine Cos, MD;  Location: [facility] OR;  Service: Open Heart Surgery;  Laterality: N/A;   EXPLORATION POST OPERATIVE OPEN HEART N/A 08/24/2019   Procedure: CHEST WASHOUT POST OPERATIVE OPEN HEART;  Surgeon: Rudine Cos, MD;  [facility];  Service: Open Heart Surgery;  Laterality: N/A;   EXPLORATION POST OPERATIVE OPEN HEART N/A 08/29/2019   Procedure: CHEST WOUND WASHOUT POST OPERATIVE OPEN HEART;  Surgeon: Rudine Cos, MD;  [facility]: [facility];  Service: Open Heart Surgery;  Laterality: N/A;   EXPLORATION POST OPERATIVE OPEN HEART N/A 09/04/2019   Procedure: MEDIASTINAL EXPLORATION WITH STERNAL WOUND IRRIGATION;  Surgeon: Norita Beauvais, MD;  Location: [facility] OR;  Service: Open Heart Surgery;  Laterality: N/A;   EXPLORATION POST OPERATIVE OPEN HEART N/A 09/14/2019   Procedure: EVACUATION OF HEMATOMA;  Surgeon: Norita Beauvais, MD;  Location: [facility] OR;  Service: Open Heart Surgery;  Laterality: N/A;   IR FLUORO GUIDE CV LINE LEFT  09/19/2019   IR GASTROSTOMY TUBE MOD SED  10/04/2019   IR GASTROSTOMY TUBE REMOVAL  11/29/2019   IR PATIENT EVAL TECH 0-60 MINS  09/29/2019   IR US  GUIDE VASC ACCESS LEFT  09/19/2019   LAPAROSCOPIC APPENDECTOMY N/A 08/31/2023   Procedure: APPENDECTOMY, LAPAROSCOPIC;  Surgeon: Anda Bamberg, MD;  [facility]: [facility];  Service: General;  Laterality: N/A;   LAPAROSCOPIC LYSIS OF ADHESIONS N/A 09/06/2019   Procedure: LAPAROSCOPIC OMENTAL HARVEST;  Surgeon: Derral Flick, MD;  Location: [facility];  Service: General;  Laterality: N/A;   LEFT HEART CATH AND CORONARY ANGIOGRAPHY N/A 08/10/2019   Procedure: LEFT HEART CATH AND CORONARY ANGIOGRAPHY;  Surgeon: Cody Das,  MD;  [facility]: [facility] INVASIVE CV LAB;  Service: Cardiovascular;  Laterality: N/A;   LEFT HEART CATHETERIZATION WITH CORONARY ANGIOGRAM N/A 02/27/2014   Procedure: LEFT HEART CATHETERIZATION WITH CORONARY ANGIOGRAM;  Surgeon: Jessica Morn, MD;  Location: [facility] CATH LAB;  Service: Cardiovascular;  Laterality: N/A;   MEDIASTINAL EXPLORATION N/A 09/06/2019   Procedure: MEDIASTINAL EXPLORATION;  Surgeon: Norita Beauvais, MD;  Location: [facility] OR;  Service: Thoracic;  Laterality: N/A;   PECTORALIS FLAP  09/06/2019   Procedure: Pectoralis ADVANCEMENT Flap;  Surgeon: Thornell Flirt, DO;  Location: [facility] OR;  Service: Plastics;;   PERCUTANEOUS CORONARY STENT INTERVENTION (PCI-S)  02/27/2014   Procedure: PERCUTANEOUS CORONARY STENT INTERVENTION (PCI-S);  Surgeon: Jessica Morn, MD;  Location: [facility] CATH LAB;  Service: Cardiovascular;;  rt PDA  3.0/28mm Promus stent   PERIPHERAL VASCULAR INTERVENTION Left 08/12/2022   Procedure: PERIPHERAL VASCULAR INTERVENTION;  Surgeon: Kayla Part, MD;  [facility] INVASIVE CV LAB;  Service: Cardiovascular;  Laterality: Left;   RADIAL ARTERY HARVEST Left 08/15/2019   Procedure: Radial Artery Harvest;  Surgeon: Rudine Cos, MD;  Location: [facility] OR;  Service: Open Heart Surgery;  Laterality: Left;   RIB PLATING N/A 09/06/2019   Procedure: STERNAL PLATING;  Surgeon: Norita Beauvais, MD;  Location: [facility] OR;  Service: Thoracic;  Laterality: N/A;   TEE WITHOUT CARDIOVERSION N/A 08/15/2019   Procedure: TRANSESOPHAGEAL ECHOCARDIOGRAM (TEE);  Surgeon: Rudine Cos, MD;  Location: [facility] OR;  Service: Open Heart Surgery;  Laterality: N/A;   TRACHEOSTOMY TUBE PLACEMENT  08/29/2019   Procedure: Tracheostomy;  Surgeon: Rudine Cos, MD;  Location: [facility] OR;  Service: Open Heart Surgery; Decannulated 6/10/20211   TRANSURETHRAL RESECTION OF PROSTATE  04/04/2012   Procedure: TRANSURETHRAL RESECTION OF THE PROSTATE WITH GYRUS INSTRUMENTS;  Surgeon: Edmund Gouge, MD;   Location: [facility];  Service: Urology;  Laterality: N/A;   TRANSURETHRAL RESECTION OF PROSTATE N/A 09/27/2014   Procedure: TRANSURETHRAL RESECTION OF THE PROSTATE ;  Surgeon: Annamarie Kid, MD;  Location: [facility]S;  Service: Urology;  Laterality: N/A;   UPPER GASTROINTESTINAL ENDOSCOPY     Family History  Family History  Problem Relation Age of Onset   Diabetes Mother    Hypertension Mother    Diabetes Father    Diabetes Brother    Hypertension Brother    Bone cancer Brother    Diabetes Brother    Social History  reports that he quit smoking about 50 years ago. His smoking use included cigarettes. He started smoking about 70 years ago. He has a 10 pack-year smoking history. He has never used smokeless tobacco. He reports that he does not currently use alcohol. He reports that he does not use drugs. Allergies No Known Allergies Home medications Prior to Admission medications   Medication Sig Start Date End Date Taking? Authorizing Provider  acetaminophen  (TYLENOL ) 325 MG tablet Take 650 mg by mouth every 6 (six) hours as needed.   Yes [provider]  acetaminophen  (TYLENOL ) 500 MG tablet Take 2 tablets (1,000 mg total) by mouth 4 (four) times daily. 08/31/23 08/30/24 Yes Anda Bamberg, MD  albuterol  (VENTOLIN  HFA) 108 (90 Base) MCG/ACT inhaler TAKE 2 PUFFS BY MOUTH EVERY 6 HOURS AS NEEDED FOR WHEEZE OR SHORTNESS OF BREATH 04/22/21  Yes [name], MD  atorvastatin  (LIPITOR) 20 MG tablet Take 1 tablet (20 mg total) by mouth every evening. Patient taking differently: Take 20 mg by mouth daily after supper. 08/24/23  Yes Knox Perl, MD  budesonide -formoterol  (SYMBICORT ) 160-4.5 MCG/ACT inhaler INHALE 2 PUFFS INTO THE LUNGS TWICE A DAY 12/09/22  Yes Wilfredo Hanly, MD  calcitRIOL (ROCALTROL) 0.25 MCG capsule Take 0.25 mcg by mouth 3 (three) times a week. Take one capsule at dinner after on Mon-Wed-Fri. 07/21/23  Yes [provider]  clonazePAM   (KLONOPIN ) 0.5 MG disintegrating tablet TAKE 1 TABLET BY MOUTH TWICE A DAY 03/15/23  Yes [name], MD  clopidogrel  (PLAVIX ) 75 MG tablet Take 1 tablet (75 mg total) by mouth daily. 06/24/23  Yes Knox Perl, MD  cromolyn  (OPTICROM ) 4 % ophthalmic solution Place 1 drop into both eyes every Wednesday. At bedtime 12/08/19  Yes [provider]  docusate sodium  (COLACE) 100 MG capsule Take 1 capsule (100 mg total) by mouth 2 (two) times daily. 08/31/23 08/30/24 Yes Anda Bamberg, MD  ferrous sulfate  325 (65 FE) MG tablet Take 325 mg by  mouth daily with breakfast.   Yes [provider]  furosemide  (LASIX ) 80 MG tablet Take 120 mg by mouth daily. Take one and one-half tablet in the morning at 0800 and 1400 daily 11/30/20  Yes [provider]  glimepiride  (AMARYL ) 2 MG tablet TAKE 1 TABLET BY MOUTH DAILY BEFORE BREAKFAST. 08/09/23  Yes [name], MD  hydrALAZINE  (APRESOLINE ) 100 MG tablet TAKE 1 TABLET BY MOUTH 3 TIMES DAILY. 10/12/22  Yes Knox Perl, MD  isosorbide  dinitrate (ISORDIL ) 30 MG tablet TAKE 1 TABLET BY MOUTH 3 TIMES DAILY. 05/03/23  Yes [name], MD  methocarbamol  (ROBAXIN -750) 750 MG tablet Take 1 tablet (750 mg total) by mouth 4 (four) times daily. 08/31/23  Yes Anda Bamberg, MD  metolazone  (ZAROXOLYN ) 5 MG tablet Take 5 mg by mouth 3 (three) times a week. Take one tablet by mouth on Sun-Tues-Thurs before each dose of Furosemide .   Yes [provider]  metoprolol  succinate (TOPROL -XL) 50 MG 24 hr tablet Take 3 tablets (150 mg total) by mouth daily. Take with or immediately following a meal. 05/11/23  Yes Knox Perl, MD  Multiple Vitamin (MULTIVITAMIN) tablet Take 1 tablet by mouth daily.   Yes [provider]  nitroGLYCERIN  (NITROSTAT ) 0.4 MG SL tablet Place 0.4 mg under the tongue every 5 (five) minutes as needed for chest pain. 10/25/19  Yes [provider]  oxyCODONE  (ROXICODONE ) 5 MG immediate release  tablet Take 1 tablet (5 mg total) by mouth every 4 (four) hours as needed. 08/31/23  Yes Anda Bamberg, MD  potassium chloride  (KLOR-CON ) 10 MEQ tablet Take 30 mEq by mouth daily. 10/25/20  Yes [provider]  PREVIDENT  5000 BOOSTER PLUS 1.1 % PSTE TAKE 1 APPLICATION BY MOUTH EVERY EVENING. 09/14/22  Yes [name], MD  senna-docusate (SENOKOT-S) 8.6-50 MG tablet Take 1 tablet by mouth at bedtime as needed for mild constipation.   Yes [provider]  sertraline  (ZOLOFT ) 25 MG tablet TAKE 1 TABLET BY MOUTH EVERY DAY 10/26/22  Yes [name], MD  silodosin (RAPAFLO) 8 MG CAPS capsule Take 1 capsule (8 mg total) by mouth at bedtime. 09/25/21  Yes McKenzie, Arden Beck, MD     Vitals:   09/04/23 0400 09/04/23 0752 09/04/23 1258 09/04/23 1735  BP: 137/79 (!) 159/75 (!) 159/79 (!) 161/80  Pulse: 92 95 (!) 104 (!) 110  Resp: 17 18 19 18   Temp: 97.6 F (36.4 C) 97.8 F (36.6 C) 98.5 F (36.9 C)   TempSrc: Oral Oral Oral   SpO2: 100% 99% 99% 98%  Weight:      Height:       Exam Gen alert, no distress No rash, cyanosis or gangrene Sclera anicteric, tongue minimally moist Flat neck veins Chest clear bilat to bases, no rales/ wheezing RRR no RG Abd is tight, sig distended, not tender other than the RLQ area,  +bs GU condom cath MS no joint effusions or deformity Ext no LE or UE edema, no other edema Neuro is alert, Ox 3 , nf, no asterixis     Renal-related home meds: Lasix  120 mg twice daily Hydralazine  100 mg 3 times daily Metolazone  5 mg 3 times per week Toprol -XL L100 50 mg daily Others: Zoloft , Rapaflo, Klor-Con , Isordil , Amaryl , Plavix , Klonopin  scheduled, Symbicort , statin, albuterol    Date   Creat  eGFR (ml/min) 2022   2.43- 3.33 17- 27 ml/min 2023   2.63- 2.90 20- 23 ml/min   2024  3.08- 3.54 17- 19 ml/min     5/05   3.55  17 ml/min 5/06   3.88 5/07   4.70 5/08   5.33 5/09   5.77 09/04/23  6.63  8 ml/min     UA 5/05- mod LE, 0-5  rbc, 21-50 wbc   Repeat UA - pend   UNa, UCr - pend    Renal US  - pend    Today --> BP 160/80, HR 108, RR 18, temp 98                --> Na 134  K+ 3.6  BUN 114   creat 6.63  Hb 9.7  WBC 12.4    Assessment/ Plan: AKI on CKD 4 - b/l creat 3.1- 3.5 from 2024. Creat was 3.5 on admission, but has now risen up to 6.63 today. Had contrast w/ CT on 5/08 but creat had already been rising. Had significantly low BP's the POD #1 and #2, including rapid response for SBP in the 70s. Vol on exam is possibly a bit dry. Suspect AKI is secondary to acute post op hypotension primarily +/- IV contrast. Pt is on very high doses of BP meds at home which would make him more susceptible due acute low BP episode. Fortunately, low BP's are no longer an issue. Pt is not uremic so won't require acute HD at this time. Will get renal US , resume IVF"s, get urine lytes and repeat UA. Will follow closely, pt aware he may need acute HD in the next few days.    S/p appendectomy: for ruptured appendix, done on 5/06 Volume: as above HTN: bp's up a bit, can resume home meds as needed, SBP goal in short-term would be 120- 150 w/ AKI.       Larry Poag  MD [facility] 09/04/2023, 7:34 PM  Recent Labs  Lab 08/31/23 0416 08/31/23 0533 09/01/23 0617 09/01/23 1427 09/03/23 0713 09/03/23 1720 09/04/23 0723 09/04/23 1646  HGB  --    < > 11.6*   < > 6.9*   < > 9.7* 11.2*  ALBUMIN  2.9*  --  2.7*  --   --   --   --   --   CALCIUM  9.3  --  8.6*   < > 7.7*  --  7.5*  --   CREATININE 3.88*  --  4.70*   < > 5.77*  --  6.63*  --   K 3.0*  --  4.0   < > 3.1*  --  3.6  --    < > = values in this interval not displayed.   Inpatient medications:  acetaminophen   1,000 mg Oral Q6H   fluticasone  furoate-vilanterol  1 puff Inhalation Daily   methocarbamol   1,000 mg Oral Q8H    piperacillin -tazobactam (ZOSYN )  IV 2.25 g (09/04/23 1450)   albuterol , dextrose , hydrALAZINE , LORazepam , morphine  injection, ondansetron  (ZOFRAN ) IV, simethicone ,  traMADol 

## 2023-09-04 NOTE — Progress Notes (Signed)
 Assessment & Plan: POD#4 - s/p lap appy with JP drain placement for perforated appendicitis [name] 5/6  - ileus resolving, BM x 3 yesterday, flatus this AM - begin CLD - persistent sanguinous output from JP drain, Hgb 9.7 - WBC 12.4 - continue zosyn  due to perforation - encourage OOB, ambulation   FEN - CLD VTE - heparin /plavix  on hold ID - Zosyn          Christian Billow, MD [facility] practice Office: [phone]        Chief Complaint: Perforated appendicitis  Subjective: Patient in bed, pleasant.  Mild pain.  Had BM's yesterday, passing flatus this AM.  Objective: Vital signs in last 24 hours: Temp:  [97.6 F (36.4 C)-99.4 F (37.4 C)] 97.8 F (36.6 C) (05/10 0752) Pulse Rate:  [88-95] 95 (05/10 0752) Resp:  [16-20] 18 (05/10 0752) BP: (124-159)/(63-82) 159/75 (05/10 0752) SpO2:  [97 %-100 %] 99 % (05/10 0752) Last BM Date : 09/03/23  Intake/Output from previous day: 05/09 0701 - 05/10 0700 In: 2845.7 [P.O.:120; I.V.:1245.4; Blood:782.3; IV Piggyback:698.1] Out: 525 [Urine:200; Drains:325] Intake/Output this shift: No intake/output data recorded.  Physical Exam: HEENT - sclerae clear, mucous membranes moist Abdomen - soft, protuberant; wounds dry and intact; JP drain with small sanguinous output  Lab Results:  Recent Labs    09/03/23 0713 09/03/23 1720 09/04/23 0723  WBC 12.6*  --  12.4*  HGB 6.9* 10.1* 9.7*  HCT 21.2* 29.3* 28.5*  PLT 200  --  205   BMET Recent Labs    09/03/23 0713 09/04/23 0723  NA 134* 134*  K 3.1* 3.6  CL 97* 98  CO2 21* 20*  GLUCOSE 181* 88  BUN 109* 114*  CREATININE 5.77* 6.63*  CALCIUM  7.7* 7.5*   PT/INR No results for input(s): "LABPROT", "INR" in the last 72 hours. Comprehensive Metabolic Panel:    Component Value Date/Time   NA 134 (L) 09/04/2023 0723   NA 134 (L) 09/03/2023 0713   NA 143 04/16/2022 0000   NA 146 (H) 05/22/2021 1551   K 3.6 09/04/2023 0723   K 3.1 (L)  09/03/2023 0713   CL 98 09/04/2023 0723   CL 97 (L) 09/03/2023 0713   CO2 20 (L) 09/04/2023 0723   CO2 21 (L) 09/03/2023 0713   BUN 114 (H) 09/04/2023 0723   BUN 109 (H) 09/03/2023 0713   BUN 35 (H) 05/22/2021 1551   BUN 47 (H) 10/23/2020 1204   CREATININE 6.63 (H) 09/04/2023 0723   CREATININE 5.77 (H) 09/03/2023 0713   CREATININE 2.87 (H) 12/20/2019 1312   GLUCOSE 88 09/04/2023 0723   GLUCOSE 181 (H) 09/03/2023 0713   CALCIUM  7.5 (L) 09/04/2023 0723   CALCIUM  7.7 (L) 09/03/2023 0713   AST 17 09/01/2023 0617   AST 21 08/31/2023 0416   ALT 17 09/01/2023 0617   ALT 19 08/31/2023 0416   ALKPHOS 55 09/01/2023 0617   ALKPHOS 67 08/31/2023 0416   BILITOT 1.0 09/01/2023 0617   BILITOT 0.9 08/31/2023 0416   BILITOT 0.2 05/22/2021 1551   BILITOT 0.3 06/19/2019 1130   PROT 6.7 09/01/2023 0617   PROT 7.1 08/31/2023 0416   PROT 6.9 05/22/2021 1551   PROT 6.5 06/19/2019 1130   ALBUMIN  2.7 (L) 09/01/2023 0617   ALBUMIN  2.9 (L) 08/31/2023 0416   ALBUMIN  4.5 05/22/2021 1551   ALBUMIN  4.2 06/19/2019 1130    Studies/Results: DG Abd 1 View Result Date: 09/03/2023 CLINICAL DATA:  08657 Abdominal distension  78295 EXAM: ABDOMEN - 1 VIEW COMPARISON:  09/30/2019. FINDINGS: There is increased amount of gas throughout the small bowel and colon, without transition point, likely sequela of adynamic ileus however, early partial small bowel obstruction can also have similar appearance. Consider radiographic follow up if clinically indicated. No evidence of pneumoperitoneum. No acute osseous abnormalities. There is blunting of left lateral costophrenic angle, suggesting small left pleural effusion. The soft tissues are otherwise within normal limits. Surgical changes, devices, tubes and lines: Metallic hardware noted overlying the midthoracic spine region. IMPRESSION: *There is increased amount of gas throughout the small bowel and colon, without transition point, likely sequela of adynamic ileus however,  early partial small bowel obstruction can also have similar appearance. Consider radiographic follow up if clinically indicated. Electronically Signed   By: Beula Brunswick M.D.   On: 09/03/2023 08:25   CT Angio Abd/Pel w/ and/or w/o Result Date: 09/02/2023 CLINICAL DATA:  Status post laparoscopic appendectomy 08/31/2023. Evaluate for intra-abdominal bleeding. EXAM: CTA ABDOMEN AND PELVIS WITHOUT AND WITH CONTRAST TECHNIQUE: Multidetector CT imaging of the abdomen and pelvis was performed using the standard protocol during bolus administration of intravenous contrast. Multiplanar reconstructed images and MIPs were obtained and reviewed to evaluate the vascular anatomy. RADIATION DOSE REDUCTION: This exam was performed according to the departmental dose-optimization program which includes automated exposure control, adjustment of the mA and/or kV according to patient size and/or use of iterative reconstruction technique. CONTRAST:  OMNIPAQUE  IOHEXOL  350 MG/ML SOLN COMPARISON:  Abdominopelvic CT 08/30/2023 and 09/28/2019. Chest CT 12/12/2020. FINDINGS: VASCULAR Aorta: Aortic atherosclerosis. Normal caliber aorta without aneurysm, dissection or significant stenosis. Celiac: Patent without evidence of aneurysm, dissection or significant stenosis. SMA: Patent without evidence of aneurysm, dissection or significant stenosis. 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 Renal vascular calcifications bilaterally without evidence of aneurysm or large vessel occlusion. IMA: Patent without evidence of aneurysm, dissection, vasculitis or significant stenosis. Inflow: Iliac atherosclerosis. Patent without evidence of aneurysm, dissection, vasculitis or significant stenosis. Veins: No significant venous abnormalities. The portal, superior mesenteric and splenic veins are patent. The IVC, renal veins and iliac veins are patent. Review of the MIP images confirms the above findings. NON-VASCULAR FINDINGS Lower chest:  Pleural thickening and effusions with calcifications bilaterally. Stable chronic rounded atelectasis at the left lung base. Hepatobiliary: The liver is normal in density without suspicious focal abnormality. No evidence of gallstones, gallbladder wall thickening or biliary dilatation. Pancreas: Unremarkable. No pancreatic ductal dilatation or surrounding inflammatory changes. Spleen: Normal in size without suspicious focal abnormality. There is a small cystic lesion medially. Adrenals/Urinary Tract: Unchanged low-density nodular thickening of both adrenal glands consistent with adrenal hyperplasia. The kidneys appear normal without evidence of urinary tract calculus, suspicious lesion or hydronephrosis. Bilateral renal cysts for which no specific follow-up imaging is recommended. A small amount of air in the bladder lumen is likely iatrogenic. Stomach/Bowel: No enteric contrast was administered. The stomach appears unremarkable for its degree of distention. Mild small bowel distension in the central abdomen, likely a mild postoperative ileus. Postsurgical changes related to interval appendectomy. There is a surgical drain with its tip in the right lower quadrant. Ill-defined fluid collections and inflammation in the right lower quadrant, further described below. No focal colonic abnormalities are identified. There are diverticular changes within the descending and sigmoid colon. No evidence of active GI bleeding. Lymphatic: There are no enlarged abdominal or pelvic lymph nodes. Reproductive: The prostate gland and seminal vesicles appear normal. Other: Left-sided surgical drain tip is in the  right lower quadrant. Right lower quadrant inflammatory changes with ill-defined fluid and small extraluminal air bubbles related to interval appendectomy. No organized fluid collection identified. There is a small amount of low-density ascites. No evidence of active intra-abdominal bleeding. There is a small amount of gas  within a chronic hernia through the lower sternotomy defect, likely postsurgical. No pneumoperitoneum. Musculoskeletal: No acute or significant osseous findings. Multilevel spondylosis. Previous median sternotomy with chronic midline defect in the sternotomy and herniation of upper abdominal fat, unchanged from previous chest CT. Mild bilateral gynecomastia. Review of the MIP images confirms the above findings. IMPRESSION: 1. No evidence of active intra-abdominal bleeding. 2. Postsurgical changes related to interval appendectomy. There is a surgical drain with its tip in the right lower quadrant. There is ill-defined fluid and small extraluminal air bubbles in the right lower quadrant, likely postsurgical. No organized fluid collection identified. 3. Mild small bowel distension in the central abdomen, likely a mild postoperative ileus. 4. Chronic pleural thickening and effusions with calcifications bilaterally. Stable chronic rounded atelectasis at the left lung base. 5. Aortic Atherosclerosis (ICD10-I70.0). No acute vascular findings demonstrated. Electronically Signed   By: Elmon Hagedorn M.D.   On: 09/02/2023 11:28      Christian [name] 09/04/2023  Patient ID: Christian [name], male   DOB: [birth year], 78 y.o.   MRN: [id number]

## 2023-09-04 NOTE — Plan of Care (Signed)

## 2023-09-04 NOTE — Progress Notes (Signed)
 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  Imaging confirmed acute appendicitis.  General surgery was consulted and cardiology was consulted to assist with preoperative cardiac clearance.   He underwent laparoscopic appendectomy on 08/31/2023.  Appendicitis with possible periappendiceal abscess - s/p lap appy 5/6 - continue zosyn  x 5 days post op (or as per surgery if further modification) - drain remains in place with bloody fluid noted - some drop in Hgb; likely multifactorial of hemodilution but also had plavix  prior to surgery so suspect some component of blood loss and stabilizing - CTA A/P obtained 5/8 and negative for bleeding and appears to be postop changes otherwise - see below for ABLA - continue trending H/H  ABLA - As noted above, possibly component of hemodilution with some blood loss with recent surgery and postop with oozing into bulb -CT angio was negative for active bleeding -Continue trending - Hgb 9.7 g/dL this morning; continue trending for stability  Acute on CKDIV ATN - patient has history of CKD4. Baseline creat ~ 3.2 - 3.5, eGFR~ 17 - he has required HD the past - possibly volume depletion from surgery/blood loss - seems to be ATN with uptrending creat/BUN but no acute signs warranting HD at this time; is starting to develop oliguria however (will attempt bladder scan) and likely put on nephrology radar soon - if continues to worsen and warrants potential HD, will consult nephrology  HTN - BP more stable now - hold home meds  DMII - Continue SSI and CBG monitoring  CAD -  s/p CABG - cleared by cardiology prior to surgery  Interval History:  Pain seems a little better this morning and he has had a bowel movement and flatus since yesterday. Continues to make urine but seems to be decreasing some.  No obvious confusion or uremia signs and no overload at this time.  Has remained on fluids.   Old records reviewed in assessment of this patient  Antimicrobials: Zosyn  08/30/2023 >> current  DVT prophylaxis:  Place and maintain sequential compression device Start: 09/01/23 2001 SCDs Start: 08/31/23 0415   Code Status:   Code Status: Full Code  Mobility Assessment (Last 72 Hours)     Mobility Assessment     Row Name 09/04/23 1200 09/04/23 0800 09/03/23 2200 09/03/23 0849 09/02/23 2100   Does patient have an order for bedrest or is patient medically unstable -- No - Continue assessment No - Continue assessment No - Continue assessment No - Continue assessment   What is the highest level of mobility based on the progressive mobility assessment? Level 2 (Chairfast) - Balance while sitting on edge of bed and cannot stand Level 1 (Bedfast) - Unable to balance while sitting on edge of bed Level 1 (Bedfast) - Unable to balance while sitting on edge of bed Level 1 (Bedfast) - Unable to balance while sitting on edge of bed Level 1 (Bedfast) - Unable to balance while sitting on edge of bed   Is the above level different from baseline mobility prior to current illness? -- Yes - Recommend PT order Yes - Recommend PT order Yes - Recommend  PT order --    Row Name 09/02/23 1533 09/02/23 1130 09/01/23 1947       Does patient have an order for bedrest or is patient medically unstable -- No - Continue assessment No - Continue assessment     What is the highest level of mobility based on the progressive mobility assessment? Level 1 (Bedfast) - Unable to balance while sitting on edge of bed Level 3 (Stands with assist) - Balance while standing  and cannot march in place Level 3  (Stands with assist) - Balance while standing  and cannot march in place     Is the above level different from baseline mobility prior to current illness? -- Yes - Recommend PT order Yes - Recommend PT order              Barriers to discharge: None Disposition Plan: Home HH orders placed:  Status is: Inpatient  Objective: Blood pressure (!) 159/79, pulse (!) 104, temperature 98.5 F (36.9 C), temperature source Oral, resp. rate 19, height 5\' 9"  (1.753 m), weight 101.6 kg, SpO2 99%.  Examination:  Physical Exam Constitutional:      General: He is not in acute distress.    Appearance: Normal appearance. He is obese.  HENT:     Head: Normocephalic and atraumatic.     Mouth/Throat:     Mouth: Mucous membranes are moist.  Eyes:     Extraocular Movements: Extraocular movements intact.  Cardiovascular:     Rate and Rhythm: Normal rate and regular rhythm.  Pulmonary:     Effort: Pulmonary effort is normal. No respiratory distress.     Breath sounds: Normal breath sounds. No wheezing.  Abdominal:     General: Abdomen is protuberant. There is no distension.     Palpations: Abdomen is soft.     Tenderness: There is abdominal tenderness.     Comments: Surgical drain in place with serosanguineous fluid noted.  Bowel sounds improved throughout  Musculoskeletal:        General: Normal range of motion.     Cervical back: Normal range of motion and neck supple.  Skin:    General: Skin is warm and dry.  Neurological:     General: No focal deficit present.     Mental Status: He is alert.  Psychiatric:        Mood and Affect: Mood normal.        Behavior: Behavior normal.      Consultants:  General surgery Nephrology  Procedures:  08/31/2023: Laparoscopic appendectomy  Data Reviewed: Results for orders placed or performed during the hospital encounter of 08/30/23 (from the past 24 hours)  Hemoglobin and hematocrit, blood     Status: Abnormal   Collection Time: 09/03/23  5:20  PM  Result Value Ref Range   Hemoglobin 10.1 (L) 13.0 - 17.0 g/dL   HCT 16.1 (L) 09.6 - 04.5 %  Global TEG Panel     Status: Abnormal   Collection Time: 09/03/23  5:20 PM  Result Value Ref Range   Citrated Kaolin (R) 7.0 4.6 - 9.1 min   Citrated Kaolin (K) 0.8 0.8 - 2.1 min   Citrated Kaolin Angle 79.9 (H) 63 - 78 deg   Citrated Kaolin (MA) 71.9 (H) 52 - 69 mm   Citrated Rapid TEG (MA) 72.6 (H) 52 - 70 mm   CK with Heparinase (R) 8.5 (H) 4.3 - 8.3 min   CFF Max Amplitude 49.4 (H) 15 - 32 mm   Citrated  Functional Fibrinogen 901.5 (H) 278 - 581 mg/dL  Glucose, capillary     Status: Abnormal   Collection Time: 09/03/23  9:17 PM  Result Value Ref Range   Glucose-Capillary 51 (L) 70 - 99 mg/dL  Glucose, capillary     Status: Abnormal   Collection Time: 09/03/23  9:57 PM  Result Value Ref Range   Glucose-Capillary 103 (H) 70 - 99 mg/dL  Glucose, capillary     Status: Abnormal   Collection Time: 09/03/23 11:57 PM  Result Value Ref Range   Glucose-Capillary 62 (L) 70 - 99 mg/dL  Glucose, capillary     Status: Abnormal   Collection Time: 09/04/23 12:22 AM  Result Value Ref Range   Glucose-Capillary 109 (H) 70 - 99 mg/dL  Glucose, capillary     Status: None   Collection Time: 09/04/23  4:37 AM  Result Value Ref Range   Glucose-Capillary 83 70 - 99 mg/dL  Basic metabolic panel with GFR     Status: Abnormal   Collection Time: 09/04/23  7:23 AM  Result Value Ref Range   Sodium 134 (L) 135 - 145 mmol/L   Potassium 3.6 3.5 - 5.1 mmol/L   Chloride 98 98 - 111 mmol/L   CO2 20 (L) 22 - 32 mmol/L   Glucose, Bld 88 70 - 99 mg/dL   BUN 440 (H) 8 - 23 mg/dL   Creatinine, Ser 1.02 (H) 0.61 - 1.24 mg/dL   Calcium  7.5 (L) 8.9 - 10.3 mg/dL   GFR, Estimated 8 (L) >60 mL/min   Anion gap 16 (H) 5 - 15  CBC     Status: Abnormal   Collection Time: 09/04/23  7:23 AM  Result Value Ref Range   WBC 12.4 (H) 4.0 - 10.5 K/uL   RBC 2.96 (L) 4.22 - 5.81 MIL/uL   Hemoglobin 9.7 (L) 13.0 - 17.0 g/dL    HCT 72.5 (L) 36.6 - 52.0 %   MCV 96.3 80.0 - 100.0 fL   MCH 32.8 26.0 - 34.0 pg   MCHC 34.0 30.0 - 36.0 g/dL   RDW 44.0 (H) 34.7 - 42.5 %   Platelets 205 150 - 400 K/uL   nRBC 0.2 0.0 - 0.2 %  Magnesium      Status: None   Collection Time: 09/04/23  7:23 AM  Result Value Ref Range   Magnesium  2.4 1.7 - 2.4 mg/dL  Glucose, capillary     Status: None   Collection Time: 09/04/23  7:50 AM  Result Value Ref Range   Glucose-Capillary 91 70 - 99 mg/dL  Glucose, capillary     Status: None   Collection Time: 09/04/23  1:00 PM  Result Value Ref Range   Glucose-Capillary 81 70 - 99 mg/dL    I have reviewed pertinent nursing notes, vitals, labs, and images as necessary. I have ordered labwork to follow up on as indicated.  I have reviewed the last notes from staff over past 24 hours. I have discussed patient's care plan and test results with nursing staff, CM/SW, and other staff as appropriate.  Time spent: Greater than 50% of the 55 minute visit was spent in counseling/coordination of care for the patient as laid out in the A&P.   LOS: 4 days   Faith Homes, MD [facility] 09/04/2023, 2:48 PM

## 2023-09-04 NOTE — Evaluation (Signed)
 Occupational Therapy Evaluation Patient Details Name: Christian [name] MRN: [id number] DOB: 1945-06-06 Today's Date: 09/04/2023   History of Present Illness   79 y.o. male presents to [facility] 08/30/23 with R lower quadrant abdominal pain and 2-3 episodes of vomiting. Pt with appendicitis and perforated appendix s/p laparoscopic appendectomy 5/6. PMHx: CAD s/p CABG, HTN, CKD 4, DMT2     Clinical Impressions Pt presented in bed and first reported they are unsure about completion therapy due to needing sleep but then changed in session due to pain. Nursing was made aware. He attempted two trials to get to he EOB with max assist but then would go into extension and then report " I am going down" and would lay back down. Pt at this time requires max assist with LE ADLS and min assist with UE ADLS at bed level. Patient will benefit from continued inpatient follow up therapy, <3 hours/day.     If plan is discharge home, recommend the following:   Two people to help with walking and/or transfers;Two people to help with bathing/dressing/bathroom;Assistance with cooking/housework;Direct supervision/assist for medications management;Direct supervision/assist for financial management;Assist for transportation;Help with stairs or ramp for entrance     Functional Status Assessment   Patient has had a recent decline in their functional status and demonstrates the ability to make significant improvements in function in a reasonable and predictable amount of time.     Equipment Recommendations   Other (comment) (TBD)     Recommendations for Other Services         Precautions/Restrictions   Precautions Precautions: Fall Precaution/Restrictions Comments: abdominal incision, JP drain Restrictions Weight Bearing Restrictions Per Provider Order: No     Mobility Bed Mobility Overal bed mobility: Needs Assistance Bed Mobility: Supine to Sit, Sit to Supine Rolling: Max assist, Used  rails Sidelying to sit: Max assist, HOB elevated, Used rails Supine to sit: Max assist, HOB elevated, Used rails   Sit to sidelying: Max assist, HOB elevated, Used rails General bed mobility comments: attempted two trials    Transfers                   General transfer comment: deffered      Balance Overall balance assessment: Needs assistance Sitting-balance support: Feet supported, Bilateral upper extremity supported Sitting balance-Leahy Scale: Zero Sitting balance - Comments: attempted twice and then would go into extension and say "I am going down"                                   ADL either performed or assessed with clinical judgement   ADL Overall ADL's : Needs assistance/impaired Eating/Feeding: Set up;Sitting   Grooming: Sitting;Contact guard assist   Upper Body Bathing: Minimal assistance;Bed level   Lower Body Bathing: Maximal assistance;Bed level   Upper Body Dressing : Minimal assistance;Bed level   Lower Body Dressing: Maximal assistance;Bed level       Toileting- Clothing Manipulation and Hygiene: Maximal assistance;Bed level               Vision         Perception         Praxis         Pertinent Vitals/Pain Pain Assessment Pain Assessment: Faces Faces Pain Scale: Hurts whole lot Breathing: normal Negative Vocalization: none Facial Expression: smiling or inexpressive Body Language: tense, distressed pacing, fidgeting Consolability: no need to console PAINAD Score: 1 Pain Location:  abdomen Pain Descriptors / Indicators: Aching, Discomfort Pain Intervention(s): Limited activity within patient's tolerance, Monitored during session, Repositioned     Extremity/Trunk Assessment Upper Extremity Assessment Upper Extremity Assessment: Overall WFL for tasks assessed   Lower Extremity Assessment Lower Extremity Assessment: Defer to PT evaluation   Cervical / Trunk Assessment Cervical / Trunk Assessment:  Normal   Communication Communication Communication: No apparent difficulties   Cognition Arousal: Alert Behavior During Therapy: WFL for tasks assessed/performed Cognition: Difficult to assess             OT - Cognition Comments: Pt may use humor to mask cognition?                 Following commands: Impaired Following commands impaired: Only follows one step commands consistently, Follows one step commands with increased time     Cueing  General Comments   Cueing Techniques: Verbal cues;Tactile cues      Exercises     Shoulder Instructions      Home Living Family/patient expects to be discharged to:: Private residence Living Arrangements: Spouse/significant other Available Help at Discharge: Family;Available 24 hours/day (light physical assist) Type of Home: House Home Access: Stairs to enter [employer] of Steps: 2   Home Layout: One level     Bathroom Shower/Tub: Chief Strategy Officer: Standard Bathroom Accessibility: Yes   Home Equipment: Shower seat;Tub bench;Grab bars - tub/shower;BSC/3in1;Rolling Walker (2 wheels);Cane - single point;Wheelchair Financial trader (4 wheels);Hand held shower head          Prior Functioning/Environment Prior Level of Function : Independent/Modified Independent;History of Falls (last six months)             Mobility Comments: ModI with RW or cane, x2 falls in past 6 months with unknown MOI ADLs Comments: Ind    OT Problem List: Decreased strength;Decreased activity tolerance;Impaired balance (sitting and/or standing);Decreased safety awareness;Decreased knowledge of use of DME or AE;Cardiopulmonary status limiting activity;Pain   OT Treatment/Interventions: Self-care/ADL training;Therapeutic exercise;DME and/or AE instruction;Therapeutic activities;Patient/family education;Balance training      OT Goals(Current goals can be found in the care plan section)   Acute Rehab OT  Goals Patient Stated Goal: to do more OT Goal Formulation: With patient Time For Goal Achievement: 09/18/23 Potential to Achieve Goals: Fair   OT Frequency:  Min 2X/week    Co-evaluation              AM-PAC OT "6 Clicks" Daily Activity     Outcome Measure Help from another person eating meals?: A Little Help from another person taking care of personal grooming?: A Little Help from another person toileting, which includes using toliet, bedpan, or urinal?: A Lot Help from another person bathing (including washing, rinsing, drying)?: A Lot Help from another person to put on and taking off regular upper body clothing?: A Little Help from another person to put on and taking off regular lower body clothing?: A Lot 6 Click Score: 15   End of Session Nurse Communication: Mobility status  Activity Tolerance: Patient limited by pain Patient left: in bed;with call bell/phone within reach;with bed alarm set  OT Visit Diagnosis: Unsteadiness on feet (R26.81);Other abnormalities of gait and mobility (R26.89);Repeated falls (R29.6);Muscle weakness (generalized) (M62.81);Pain Pain - Right/Left:  (abdomen)                Time: 4098-1191 OT Time Calculation (min): 22 min Charges:  OT General Charges $OT Visit: 1 Visit OT Evaluation $OT Eval Low Complexity: 1 Low  Erving Heather OTR/L  Acute Rehab Services  [phone] office number   Stevphen Elders 09/04/2023, 12:26 PM

## 2023-09-05 ENCOUNTER — Inpatient Hospital Stay (HOSPITAL_COMMUNITY)

## 2023-09-05 DIAGNOSIS — D62 Acute posthemorrhagic anemia: Secondary | ICD-10-CM | POA: Diagnosis not present

## 2023-09-05 DIAGNOSIS — K37 Unspecified appendicitis: Secondary | ICD-10-CM | POA: Diagnosis not present

## 2023-09-05 DIAGNOSIS — N179 Acute kidney failure, unspecified: Secondary | ICD-10-CM | POA: Diagnosis not present

## 2023-09-05 DIAGNOSIS — N184 Chronic kidney disease, stage 4 (severe): Secondary | ICD-10-CM | POA: Diagnosis not present

## 2023-09-05 LAB — RENAL FUNCTION PANEL
Albumin: 2.1 g/dL — ABNORMAL LOW (ref 3.5–5.0)
Anion gap: 19 — ABNORMAL HIGH (ref 5–15)
BUN: 115 mg/dL — ABNORMAL HIGH (ref 8–23)
CO2: 16 mmol/L — ABNORMAL LOW (ref 22–32)
Calcium: 7.9 mg/dL — ABNORMAL LOW (ref 8.9–10.3)
Chloride: 100 mmol/L (ref 98–111)
Creatinine, Ser: 7.31 mg/dL — ABNORMAL HIGH (ref 0.61–1.24)
GFR, Estimated: 7 mL/min — ABNORMAL LOW (ref >60)
Glucose, Bld: 73 mg/dL (ref 70–99)
Phosphorus: 9.4 mg/dL — ABNORMAL HIGH (ref 2.5–4.6)
Potassium: 4.1 mmol/L (ref 3.5–5.1)
Sodium: 135 mmol/L (ref 135–145)

## 2023-09-05 LAB — HEMOGLOBIN AND HEMATOCRIT, BLOOD
HCT: 31.5 % — ABNORMAL LOW (ref 39.0–52.0)
Hemoglobin: 10.4 g/dL — ABNORMAL LOW (ref 13.0–17.0)

## 2023-09-05 LAB — GLUCOSE, CAPILLARY
Glucose-Capillary: 109 mg/dL — ABNORMAL HIGH (ref 70–99)
Glucose-Capillary: 118 mg/dL — ABNORMAL HIGH (ref 70–99)
Glucose-Capillary: 156 mg/dL — ABNORMAL HIGH (ref 70–99)
Glucose-Capillary: 74 mg/dL (ref 70–99)
Glucose-Capillary: 75 mg/dL (ref 70–99)
Glucose-Capillary: 91 mg/dL (ref 70–99)

## 2023-09-05 LAB — MAGNESIUM: Magnesium: 2.5 mg/dL — ABNORMAL HIGH (ref 1.7–2.4)

## 2023-09-05 MED ORDER — BOOST / RESOURCE BREEZE PO LIQD CUSTOM
1.0000 | Freq: Three times a day (TID) | ORAL | Status: DC
  Administered 2023-09-05: 1 via ORAL

## 2023-09-05 NOTE — Progress Notes (Signed)
 Patient complaining of increasing ABD pain and discomfort. Paged General surgery. Was given verbal order to place NG tube to low intermittent suction. Rn went to place NG tube, Explained procedure to patient and explained how it will make him feel better by relieving the pressure and gas in his Abdomen, Patient refused and told this RN that nothing was going to be placed in his nose. RN tried multiple times to talk patient into receiving NG tube and patient refused.

## 2023-09-05 NOTE — Progress Notes (Signed)
 Order for NG tube placed on previous shift. This nurse informed the pt and he continues to refuse the NG tube placement. Pt educated and all questions were answered.

## 2023-09-05 NOTE — Progress Notes (Signed)
 Progress Note    Christian [name]   ION:[id number]  DOB: 07-Feb-1946  DOA: 08/30/2023     5 PCP: [name], MD  Initial CC: Right lower quadrant abdominal pain  Hospital Course: Christian [name] is a 78 year old with history of CAD status post CABG with postoperative CVA and renal failure necessitating brief hemodialysis who presented to the emergency department with complaints of right lower quadrant abdominal pain.  Imaging confirmed acute appendicitis.  General surgery was consulted and cardiology was consulted to assist with preoperative cardiac clearance.   He underwent laparoscopic appendectomy on 08/31/2023.  Appendicitis with possible periappendiceal abscess Post op ileus - s/p lap appy 5/6 - continue zosyn  x 5 days post op (or as per surgery if further modification) - drain remains in place with bloody fluid noted - some drop in Hgb; likely multifactorial of hemodilution but also had plavix  prior to surgery so suspect some component of blood loss and stabilizing - CTA A/P obtained 5/8 and negative for bleeding and appears to be postop changes otherwise - see below for ABLA - still poor intake with liquids; concern that nutrition may need to be addressed soon if persists  ABLA - As noted above, possibly component of hemodilution with some blood loss with recent surgery and postop with oozing into bulb -CT angio was negative for active bleeding -Continue trending - Hgb 10.4 g/dL this morning; seems to be remaining stable   Acute on CKDIV ATN - patient has history of CKD4. Baseline creat ~ 3.2 - 3.5, eGFR~ 17 - he has required HD the past - possibly volume depletion from surgery/blood loss and when he had severe hypoTN on 5/7; I don't think due to zosyn  but AIN always a possibility as well  - seems to be ATN with uptrending creat/BUN but no acute signs warranting HD at this time; is starting to develop oliguria however (no retention on bladder scans); still no other  signs yet to warrant HD - nephrology aware and following for if/when needs HD  HTN - BP more stable now - hold home meds  DMII - Continue SSI and CBG monitoring  CAD - s/p CABG - cleared by cardiology prior to surgery  Interval History:  Wife present this morning; confirms no changes in mentation.  Unfortunately still not taking in much liquids. Abdomen remains distended and very painful. Some nausea but has not started vomiting much.    Old records reviewed in assessment of this patient  Antimicrobials: Zosyn  08/30/2023 >> current  DVT prophylaxis:  Place and maintain sequential compression device Start: 09/01/23 2001 SCDs Start: 08/31/23 0415   Code Status:   Code Status: Full Code  Mobility Assessment (Last 72 Hours)     Mobility Assessment     Row Name 09/05/23 0800 09/04/23 2111 09/04/23 1200 09/04/23 0800 09/03/23 2200   Does patient have an order for bedrest or is patient medically unstable No - Continue assessment No - Continue assessment -- No - Continue assessment No - Continue assessment   What is the highest level of mobility based on the progressive mobility assessment? Level 1 (Bedfast) - Unable to balance while sitting on edge of bed Level 1 (Bedfast) - Unable to balance while sitting on edge of bed Level 2 (Chairfast) - Balance while sitting on edge of bed and cannot stand Level 1 (Bedfast) - Unable to balance while sitting on edge of bed Level 1 (Bedfast) - Unable to balance while sitting on edge of bed   Is  the above level different from baseline mobility prior to current illness? Yes - Recommend PT order Yes - Recommend PT order -- Yes - Recommend PT order Yes - Recommend PT order    Row Name 09/03/23 0849 09/02/23 2100 09/02/23 1533       Does patient have an order for bedrest or is patient medically unstable No - Continue assessment No - Continue assessment --     What is the highest level of mobility based on the progressive mobility assessment? Level 1  (Bedfast) - Unable to balance while sitting on edge of bed Level 1 (Bedfast) - Unable to balance while sitting on edge of bed Level 1 (Bedfast) - Unable to balance while sitting on edge of bed     Is the above level different from baseline mobility prior to current illness? Yes - Recommend PT order -- --              Barriers to discharge: None Disposition Plan: Home HH orders placed:  Status is: Inpatient  Objective: Blood pressure (!) 141/70, pulse (!) 106, temperature 98 F (36.7 C), temperature source Oral, resp. rate 16, height 5\' 9"  (1.753 m), weight 97.6 kg, SpO2 98%.  Examination:  Physical Exam Constitutional:      General: He is not in acute distress.    Appearance: Normal appearance. He is obese.  HENT:     Head: Normocephalic and atraumatic.     Mouth/Throat:     Mouth: Mucous membranes are moist.  Eyes:     Extraocular Movements: Extraocular movements intact.  Cardiovascular:     Rate and Rhythm: Normal rate and regular rhythm.  Pulmonary:     Effort: Pulmonary effort is normal. No respiratory distress.     Breath sounds: Normal breath sounds. No wheezing.  Abdominal:     General: Abdomen is protuberant. There is no distension.     Palpations: Abdomen is soft.     Tenderness: There is abdominal tenderness.     Comments: Surgical drain in place with serosanguineous fluid noted.  Bowel sounds present in right quds but very hypoactive in left quadrants  Musculoskeletal:        General: Normal range of motion.     Cervical back: Normal range of motion and neck supple.  Skin:    General: Skin is warm and dry.  Neurological:     General: No focal deficit present.     Mental Status: He is alert.  Psychiatric:        Mood and Affect: Mood normal.        Behavior: Behavior normal.      Consultants:  General surgery Nephrology  Procedures:  08/31/2023: Laparoscopic appendectomy  Data Reviewed: Results for orders placed or performed during the hospital  encounter of 08/30/23 (from the past 24 hours)  Hemoglobin and hematocrit, blood     Status: Abnormal   Collection Time: 09/04/23  4:46 PM  Result Value Ref Range   Hemoglobin 11.2 (L) 13.0 - 17.0 g/dL   HCT 14.7 (L) 82.9 - 56.2 %  Glucose, capillary     Status: Abnormal   Collection Time: 09/04/23  5:37 PM  Result Value Ref Range   Glucose-Capillary 64 (L) 70 - 99 mg/dL  Glucose, capillary     Status: Abnormal   Collection Time: 09/04/23  6:31 PM  Result Value Ref Range   Glucose-Capillary 145 (H) 70 - 99 mg/dL  Glucose, capillary     Status: Abnormal   Collection  Time: 09/04/23  8:57 PM  Result Value Ref Range   Glucose-Capillary 111 (H) 70 - 99 mg/dL  Glucose, capillary     Status: None   Collection Time: 09/04/23 11:46 PM  Result Value Ref Range   Glucose-Capillary 94 70 - 99 mg/dL  Glucose, capillary     Status: None   Collection Time: 09/05/23  4:22 AM  Result Value Ref Range   Glucose-Capillary 75 70 - 99 mg/dL  Magnesium      Status: Abnormal   Collection Time: 09/05/23  8:10 AM  Result Value Ref Range   Magnesium  2.5 (H) 1.7 - 2.4 mg/dL  Renal function panel     Status: Abnormal   Collection Time: 09/05/23  8:10 AM  Result Value Ref Range   Sodium 135 135 - 145 mmol/L   Potassium 4.1 3.5 - 5.1 mmol/L   Chloride 100 98 - 111 mmol/L   CO2 16 (L) 22 - 32 mmol/L   Glucose, Bld 73 70 - 99 mg/dL   BUN 161 (H) 8 - 23 mg/dL   Creatinine, Ser 0.96 (H) 0.61 - 1.24 mg/dL   Calcium  7.9 (L) 8.9 - 10.3 mg/dL   Phosphorus 9.4 (H) 2.5 - 4.6 mg/dL   Albumin  2.1 (L) 3.5 - 5.0 g/dL   GFR, Estimated 7 (L) >60 mL/min   Anion gap 19 (H) 5 - 15  Glucose, capillary     Status: None   Collection Time: 09/05/23  8:19 AM  Result Value Ref Range   Glucose-Capillary 74 70 - 99 mg/dL  Hemoglobin and hematocrit, blood     Status: Abnormal   Collection Time: 09/05/23 10:08 AM  Result Value Ref Range   Hemoglobin 10.4 (L) 13.0 - 17.0 g/dL   HCT 04.5 (L) 40.9 - 81.1 %  Glucose, capillary      Status: None   Collection Time: 09/05/23 11:56 AM  Result Value Ref Range   Glucose-Capillary 91 70 - 99 mg/dL    I have reviewed pertinent nursing notes, vitals, labs, and images as necessary. I have ordered labwork to follow up on as indicated.  I have reviewed the last notes from staff over past 24 hours. I have discussed patient's care plan and test results with nursing staff, CM/SW, and other staff as appropriate.  Time spent: Greater than 50% of the 55 minute visit was spent in counseling/coordination of care for the patient as laid out in the A&P.   LOS: 5 days   Faith Homes, MD [facility] 09/05/2023, 2:27 PM

## 2023-09-05 NOTE — Plan of Care (Signed)

## 2023-09-05 NOTE — Progress Notes (Signed)
 [facility] Progress Note  Subjective:  BP's stable 130/70- 160/ 89 range No new UOP recorded overnight Pt w/o new c/o's, wife and family at bedside  Vitals:   09/04/23 1258 09/04/23 1735 09/04/23 2001 09/05/23 0344  BP: (!) 159/79 (!) 161/80 (!) 158/89 133/67  Pulse: (!) 104 (!) 110 (!) 109 (!) 109  Resp: 19 18  16   Temp: 98.5 F (36.9 C)   98.1 F (36.7 C)  TempSrc: Oral   Oral  SpO2: 99% 98% 99% 99%  Weight:      Height:        Exam: Gen alert, no distress Sclera anicteric, tongue minimally moist Flat neck veins Chest clear bilat to bases RRR no RG Abd distended and tight, not tender other than the RLQ,  +bs GU condom cath Ext no LE or UE edema, no other edema Neuro is alert, Ox 3 , nf, no asterixis        Renal-related home meds: Lasix  120 mg twice daily Hydralazine  100 mg 3 times daily Metolazone  5 mg 3 times per week Toprol -XL 150 mg daily Others: Zoloft , Rapaflo, Klor-Con , Isordil , Amaryl , Plavix , Klonopin  scheduled, Symbicort , statin, albuterol      Date                             Creat               eGFR (ml/min) 2022                            2.43- 3.33        17- 27 ml/min 2023                            2.63- 2.90        20- 23 ml/min   2024                            3.08- 3.54        17- 19 ml/min                                       5/05                             3.55                 17 ml/min 5/06                             3.88 5/07                             4.70 5/08                             5.33 5/09                             5.77 09/04/23  6.63                 8 ml/min      UA 5/05- mod LE, 0-5 rbc, 21-50 wbc   Repeat UA - pend   UNa, UCr - pend    Renal US  - 8.5/ 9.0 cm kidneys w/o hydro    Today --> BP 160/80, HR 108, RR 18, temp 98                --> Na 134  K+ 3.6  BUN 114   creat 6.63  Hb 9.7  WBC 12.4       Assessment/ Plan: AKI on CKD 4 - b/l creat 3.1- 3.5 from 2024. Creat was  3.5 on admission, but has now risen up to 6.63 today.  Post-op there was a significant period of low BP's, including a rapid response for SBP in the 70s. Also rec'd IV contrast w/ CT on 5/08 but creat had already been rising at that time. Exam 5/10 was euvolemic to possibly dry. AKI suspected due to acute post-op hypotension +/- contrast nephropathy. Likely has ATN. Renal US  showed no obstruction. Repeat UA pending. IVF"s restarted last night at 75 cc/hr. Creat today up to 7.3, bun stable at 115. Will place foley cath. No sig uremic signs. He is at baseline mentally per family.  Will need HD in 24-48 hrs unless improving, have d/w pt and family.  S/p appendectomy: for ruptured appendix, done on 5/06 Volume: as above, restarted IVF"s at 75 cc/hr HTN: bp's 160/80, home bp meds on hold. Cont to observe.             Larry Poag MD  [facility] 09/05/2023, 5:34 AM  Recent Labs  Lab 08/31/23 0416 08/31/23 0533 09/01/23 0617 09/01/23 1427 09/03/23 0713 09/03/23 1720 09/04/23 0723 09/04/23 1646  HGB  --    < > 11.6*   < > 6.9*   < > 9.7* 11.2*  ALBUMIN  2.9*  --  2.7*  --   --   --   --   --   CALCIUM  9.3  --  8.6*   < > 7.7*  --  7.5*  --   CREATININE 3.88*  --  4.70*   < > 5.77*  --  6.63*  --   K 3.0*  --  4.0   < > 3.1*  --  3.6  --    < > = values in this interval not displayed.   No results for input(s): "IRON", "TIBC", "FERRITIN" in the last 168 hours. Inpatient medications:  acetaminophen   1,000 mg Oral Q6H   feeding supplement  1 Container Oral TID BM   fluticasone  furoate-vilanterol  1 puff Inhalation Daily   methocarbamol   1,000 mg Oral Q8H    sodium chloride  75 mL/hr at 09/05/23 0344   piperacillin -tazobactam (ZOSYN )  IV 2.25 g (09/05/23 0521)   albuterol , dextrose , hydrALAZINE , LORazepam , morphine  injection, ondansetron  (ZOFRAN ) IV, simethicone , traMADol 

## 2023-09-05 NOTE — Progress Notes (Signed)
 Assessment & Plan: POD#5 - s/p lap appy with JP drain placement for perforated appendicitis [name] 5/6  - persistent mild ileus, BM this AM, flatus this AM - continue on CLD - dark sanguinous output from JP drain, Hgb 11.2 - continue IV Zosyn  due to perforation - encourage OOB, ambulation   FEN - CLD VTE - heparin /plavix  on hold ID - Zosyn         Christian Billow, MD [facility] practice Office: [phone]        Chief Complaint: Perforated appendicitis  Subjective: Patient in bed, complains of abdominal distension, limited po intake  Objective: Vital signs in last 24 hours: Temp:  [98 F (36.7 C)-98.5 F (36.9 C)] 98 F (36.7 C) (05/11 0751) Pulse Rate:  [104-110] 106 (05/11 0751) Resp:  [16-19] 16 (05/11 0751) BP: (133-161)/(67-89) 141/70 (05/11 0751) SpO2:  [98 %-99 %] 98 % (05/11 0751) Weight:  [97.6 kg] 97.6 kg (05/11 0704) Last BM Date : 09/04/23  Intake/Output from previous day: 05/10 0701 - 05/11 0700 In: 1508.2 [P.O.:236; I.V.:1172.2; IV Piggyback:100] Out: 130 [Urine:100; Drains:30] Intake/Output this shift: No intake/output data recorded.  Physical Exam: HEENT - sclerae clear, mucous membranes moist Abdomen - moderate distension, tense, tender RLQ; JP with dark sanguinous output  Lab Results:  Recent Labs    09/03/23 0713 09/03/23 1720 09/04/23 0723 09/04/23 1646  WBC 12.6*  --  12.4*  --   HGB 6.9*   < > 9.7* 11.2*  HCT 21.2*   < > 28.5* 33.7*  PLT 200  --  205  --    < > = values in this interval not displayed.   BMET Recent Labs    09/03/23 0713 09/04/23 0723  NA 134* 134*  K 3.1* 3.6  CL 97* 98  CO2 21* 20*  GLUCOSE 181* 88  BUN 109* 114*  CREATININE 5.77* 6.63*  CALCIUM  7.7* 7.5*   PT/INR No results for input(s): "LABPROT", "INR" in the last 72 hours. Comprehensive Metabolic Panel:    Component Value Date/Time   NA 134 (L) 09/04/2023 0723   NA 134 (L) 09/03/2023 0713   NA 143 04/16/2022  0000   NA 146 (H) 05/22/2021 1551   K 3.6 09/04/2023 0723   K 3.1 (L) 09/03/2023 0713   CL 98 09/04/2023 0723   CL 97 (L) 09/03/2023 0713   CO2 20 (L) 09/04/2023 0723   CO2 21 (L) 09/03/2023 0713   BUN 114 (H) 09/04/2023 0723   BUN 109 (H) 09/03/2023 0713   BUN 35 (H) 05/22/2021 1551   BUN 47 (H) 10/23/2020 1204   CREATININE 6.63 (H) 09/04/2023 0723   CREATININE 5.77 (H) 09/03/2023 0713   CREATININE 2.87 (H) 12/20/2019 1312   GLUCOSE 88 09/04/2023 0723   GLUCOSE 181 (H) 09/03/2023 0713   CALCIUM  7.5 (L) 09/04/2023 0723   CALCIUM  7.7 (L) 09/03/2023 0713   AST 17 09/01/2023 0617   AST 21 08/31/2023 0416   ALT 17 09/01/2023 0617   ALT 19 08/31/2023 0416   ALKPHOS 55 09/01/2023 0617   ALKPHOS 67 08/31/2023 0416   BILITOT 1.0 09/01/2023 0617   BILITOT 0.9 08/31/2023 0416   BILITOT 0.2 05/22/2021 1551   BILITOT 0.3 06/19/2019 1130   PROT 6.7 09/01/2023 0617   PROT 7.1 08/31/2023 0416   PROT 6.9 05/22/2021 1551   PROT 6.5 06/19/2019 1130   ALBUMIN  2.7 (L) 09/01/2023 0617   ALBUMIN  2.9 (L) 08/31/2023 0416   ALBUMIN  4.5  05/22/2021 1551   ALBUMIN  4.2 06/19/2019 1130    Studies/Results: No results found.    Christian [name] 09/05/2023  Patient ID: Christian [name], male   DOB: 1945/07/30, 78 y.o.   MRN: [id number]

## 2023-09-06 ENCOUNTER — Inpatient Hospital Stay (HOSPITAL_COMMUNITY)

## 2023-09-06 ENCOUNTER — Encounter (HOSPITAL_COMMUNITY)

## 2023-09-06 ENCOUNTER — Inpatient Hospital Stay (HOSPITAL_COMMUNITY): Admission: RE | Admit: 2023-09-06 | Source: Ambulatory Visit

## 2023-09-06 ENCOUNTER — Ambulatory Visit: Admitting: Surgery

## 2023-09-06 DIAGNOSIS — D62 Acute posthemorrhagic anemia: Secondary | ICD-10-CM | POA: Diagnosis not present

## 2023-09-06 DIAGNOSIS — T82898A Other specified complication of vascular prosthetic devices, implants and grafts, initial encounter: Secondary | ICD-10-CM | POA: Diagnosis not present

## 2023-09-06 DIAGNOSIS — N184 Chronic kidney disease, stage 4 (severe): Secondary | ICD-10-CM | POA: Diagnosis not present

## 2023-09-06 DIAGNOSIS — K37 Unspecified appendicitis: Secondary | ICD-10-CM | POA: Diagnosis not present

## 2023-09-06 DIAGNOSIS — N185 Chronic kidney disease, stage 5: Secondary | ICD-10-CM

## 2023-09-06 DIAGNOSIS — N179 Acute kidney failure, unspecified: Secondary | ICD-10-CM | POA: Diagnosis not present

## 2023-09-06 LAB — CBC WITH DIFFERENTIAL/PLATELET
Abs Immature Granulocytes: 0.4 10*3/uL — ABNORMAL HIGH (ref 0.00–0.07)
Basophils Absolute: 0 10*3/uL (ref 0.0–0.1)
Basophils Relative: 0 %
Eosinophils Absolute: 0 10*3/uL (ref 0.0–0.5)
Eosinophils Relative: 0 %
HCT: 34.6 % — ABNORMAL LOW (ref 39.0–52.0)
Hemoglobin: 11.4 g/dL — ABNORMAL LOW (ref 13.0–17.0)
Lymphocytes Relative: 3 %
Lymphs Abs: 0.4 10*3/uL — ABNORMAL LOW (ref 0.7–4.0)
MCH: 32.6 pg (ref 26.0–34.0)
MCHC: 32.9 g/dL (ref 30.0–36.0)
MCV: 98.9 fL (ref 80.0–100.0)
Metamyelocytes Relative: 1 %
Monocytes Absolute: 0.6 10*3/uL (ref 0.1–1.0)
Monocytes Relative: 4 %
Myelocytes: 2 %
Neutro Abs: 13.4 10*3/uL — ABNORMAL HIGH (ref 1.7–7.7)
Neutrophils Relative %: 90 %
Platelets: 324 10*3/uL (ref 150–400)
RBC: 3.5 MIL/uL — ABNORMAL LOW (ref 4.22–5.81)
RDW: 16.7 % — ABNORMAL HIGH (ref 11.5–15.5)
WBC: 14.9 10*3/uL — ABNORMAL HIGH (ref 4.0–10.5)
nRBC: 0 % (ref 0.0–0.2)
nRBC: 0 /100{WBCs}

## 2023-09-06 LAB — MAGNESIUM: Magnesium: 2.7 mg/dL — ABNORMAL HIGH (ref 1.7–2.4)

## 2023-09-06 LAB — RENAL FUNCTION PANEL
Albumin: 2 g/dL — ABNORMAL LOW (ref 3.5–5.0)
Anion gap: 21 — ABNORMAL HIGH (ref 5–15)
BUN: 119 mg/dL — ABNORMAL HIGH (ref 8–23)
CO2: 16 mmol/L — ABNORMAL LOW (ref 22–32)
Calcium: 8.3 mg/dL — ABNORMAL LOW (ref 8.9–10.3)
Chloride: 99 mmol/L (ref 98–111)
Creatinine, Ser: 8.15 mg/dL — ABNORMAL HIGH (ref 0.61–1.24)
GFR, Estimated: 6 mL/min — ABNORMAL LOW (ref >60)
Glucose, Bld: 104 mg/dL — ABNORMAL HIGH (ref 70–99)
Phosphorus: 11 mg/dL — ABNORMAL HIGH (ref 2.5–4.6)
Potassium: 4.4 mmol/L (ref 3.5–5.1)
Sodium: 136 mmol/L (ref 135–145)

## 2023-09-06 LAB — GLUCOSE, CAPILLARY
Glucose-Capillary: 105 mg/dL — ABNORMAL HIGH (ref 70–99)
Glucose-Capillary: 111 mg/dL — ABNORMAL HIGH (ref 70–99)
Glucose-Capillary: 120 mg/dL — ABNORMAL HIGH (ref 70–99)
Glucose-Capillary: 85 mg/dL (ref 70–99)
Glucose-Capillary: 90 mg/dL (ref 70–99)

## 2023-09-06 LAB — HEPATITIS B SURFACE ANTIGEN: Hepatitis B Surface Ag: NONREACTIVE

## 2023-09-06 MED ORDER — STERILE WATER FOR INJECTION IV SOLN
INTRAMUSCULAR | Status: AC
  Filled 2023-09-06: qty 1000
  Filled 2023-09-06: qty 150

## 2023-09-06 MED ORDER — CHLORHEXIDINE GLUCONATE CLOTH 2 % EX PADS
6.0000 | MEDICATED_PAD | Freq: Every day | CUTANEOUS | Status: DC
Start: 2023-09-06 — End: 2023-09-09
  Administered 2023-09-06 – 2023-09-07 (×2): 6 via TOPICAL

## 2023-09-06 MED ORDER — ACETAMINOPHEN 10 MG/ML IV SOLN
1000.0000 mg | Freq: Four times a day (QID) | INTRAVENOUS | Status: AC
  Administered 2023-09-06 – 2023-09-07 (×2): 1000 mg via INTRAVENOUS
  Filled 2023-09-06 (×3): qty 100

## 2023-09-06 MED ORDER — METHOCARBAMOL 1000 MG/10ML IJ SOLN
500.0000 mg | Freq: Three times a day (TID) | INTRAMUSCULAR | Status: DC | PRN
  Administered 2023-09-07: 500 mg via INTRAVENOUS
  Filled 2023-09-06 (×2): qty 10

## 2023-09-06 MED ORDER — CHLORHEXIDINE GLUCONATE CLOTH 2 % EX PADS
6.0000 | MEDICATED_PAD | Freq: Every day | CUTANEOUS | Status: DC
  Administered 2023-09-07 – 2023-09-09 (×3): 6 via TOPICAL

## 2023-09-06 NOTE — Progress Notes (Signed)
 Progress Note    GERRITT BIGNELL   ZOX:[id number]  DOB: [birth year]  DOA: 08/30/2023     6 PCP: [name], MD  Initial CC: Right lower quadrant abdominal pain  Hospital Course: Mr. 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  Imaging confirmed acute appendicitis.  General surgery was consulted and cardiology was consulted to assist with preoperative cardiac clearance.   He underwent laparoscopic appendectomy on 08/31/2023.  Appendicitis with possible periappendiceal abscess Post op ileus - s/p lap appy 5/6 - continue zosyn  x 5 days post op (or as per surgery if further modification) - drain remains in place with bloody fluid noted - some drop in Hgb; likely multifactorial of hemodilution but also had plavix  prior to surgery so suspect some component of blood loss and stabilizing - CTA A/P obtained 5/8 and negative for bleeding and appears to be postop changes otherwise - see below for ABLA - Poor intake and no sign of ileus improving.  Surgery recommending TPN.  Cannot place PICC due to renal issues but we are working on central access - Blunt discussion held bedside this morning with his wife present.  He is now amenable with NG tube placement  Acute on CKDIV ATN - patient has history of CKD4. Baseline creat ~ 3.2 - 3.5, eGFR~ 17 - he has required HD the past - possibly volume depletion from surgery/blood loss and when he had severe hypoTN on 5/7; I don't think due to zosyn  but AIN always a possibility as well  - seems to be ATN with uptrending creat/BUN; has been slowly approaching need for dialysis - Nephrology has been following also - Vascular surgery consulted to assist with evaluating left fistula for potential use, if unable would need dialysis catheter placement with radiology;  workup in progress  ABLA - As noted above, possibly component of hemodilution with some blood loss with recent surgery and postop with oozing into bulb -CT angio was negative for active bleeding -Continue trending - Hgb 11.4 g/dL this morning; seems to be remaining stable   HTN - BP more stable now - hold home meds  DMII - Continue SSI and CBG monitoring  CAD - s/p CABG - cleared by cardiology prior to surgery  Interval History:  Wife present this morning; confirms no changes in mentation.  Had a stern talk about need for NG tube placement for decompression.  He finally agreed and nursing was able to place it shortly after with immediate output. He is also approaching need for dialysis given worsening renal function and worsening acidosis and urine output.    Old records reviewed in assessment of this patient  Antimicrobials: Zosyn  08/30/2023 >> current  DVT prophylaxis:  Place and maintain sequential compression device Start: 09/01/23 2001 SCDs Start: 08/31/23 0415   Code Status:   Code Status: Full Code  Mobility Assessment (Last 72 Hours)     Mobility Assessment     Row Name 09/06/23 1012 09/06/23 0900 09/05/23 2010 09/05/23 0800 09/04/23 2111   Does patient have an order for bedrest or is patient medically unstable -- No - Continue assessment No - Continue assessment No - Continue assessment No - Continue assessment   What is the highest level of mobility based on the progressive mobility assessment? Level 1 (Bedfast) - Unable to balance while sitting on edge of bed  Level 1 (Bedfast) - Unable to balance while sitting on edge of bed Level 3 (Stands with assist) - Balance while standing  and cannot march in place Level 1 (Bedfast) - Unable to balance while sitting on edge of bed Level 1 (Bedfast) - Unable to balance while sitting on edge of bed   Is the above level different from baseline mobility prior to current illness? -- Yes - Recommend PT order Yes - Recommend PT  order Yes - Recommend PT order Yes - Recommend PT order    Row Name 09/04/23 1200 09/04/23 0800 09/03/23 2200       Does patient have an order for bedrest or is patient medically unstable -- No - Continue assessment No - Continue assessment     What is the highest level of mobility based on the progressive mobility assessment? Level 2 (Chairfast) - Balance while sitting on edge of bed and cannot stand Level 1 (Bedfast) - Unable to balance while sitting on edge of bed Level 1 (Bedfast) - Unable to balance while sitting on edge of bed     Is the above level different from baseline mobility prior to current illness? -- Yes - Recommend PT order Yes - Recommend PT order              Barriers to discharge: None Disposition Plan: Home HH orders placed:  Status is: Inpatient  Objective: Blood pressure (!) 148/85, pulse (!) 117, temperature 98.3 F (36.8 C), temperature source Oral, resp. rate 18, height 5\' 9"  (1.753 m), weight 96.3 kg, SpO2 98%.  Examination:  Physical Exam Constitutional:      General: He is not in acute distress.    Appearance: Normal appearance. He is obese.  HENT:     Head: Normocephalic and atraumatic.     Mouth/Throat:     Mouth: Mucous membranes are moist.  Eyes:     Extraocular Movements: Extraocular movements intact.  Cardiovascular:     Rate and Rhythm: Normal rate and regular rhythm.  Pulmonary:     Effort: Pulmonary effort is normal. No respiratory distress.     Breath sounds: Normal breath sounds. No wheezing.  Abdominal:     General: Abdomen is protuberant. There is no distension.     Palpations: Abdomen is soft.     Tenderness: There is abdominal tenderness.     Comments: Surgical drain in place with serosanguineous fluid noted.  Bowel sounds now hypoactive throughout  Musculoskeletal:        General: Normal range of motion.     Cervical back: Normal range of motion and neck supple.  Skin:    General: Skin is warm and dry.  Neurological:      General: No focal deficit present.     Mental Status: He is alert.  Psychiatric:        Mood and Affect: Mood normal.        Behavior: Behavior normal.      Consultants:  General surgery Nephrology Vascular surgery IR  Procedures:  08/31/2023: Laparoscopic appendectomy  Data Reviewed: Results for orders placed or performed during the hospital encounter of 08/30/23 (from the past 24 hours)  Glucose, capillary     Status: Abnormal   Collection Time: 09/05/23  4:41 PM  Result Value Ref Range   Glucose-Capillary 109 (H) 70 - 99 mg/dL  Glucose, capillary     Status: Abnormal   Collection Time: 09/05/23  9:10 PM  Result Value Ref Range   Glucose-Capillary 118 (H)  70 - 99 mg/dL  Glucose, capillary     Status: Abnormal   Collection Time: 09/05/23 11:43 PM  Result Value Ref Range   Glucose-Capillary 156 (H) 70 - 99 mg/dL  Glucose, capillary     Status: Abnormal   Collection Time: 09/06/23  4:26 AM  Result Value Ref Range   Glucose-Capillary 120 (H) 70 - 99 mg/dL  Magnesium      Status: Abnormal   Collection Time: 09/06/23  6:52 AM  Result Value Ref Range   Magnesium  2.7 (H) 1.7 - 2.4 mg/dL  Renal function panel     Status: Abnormal   Collection Time: 09/06/23  6:52 AM  Result Value Ref Range   Sodium 136 135 - 145 mmol/L   Potassium 4.4 3.5 - 5.1 mmol/L   Chloride 99 98 - 111 mmol/L   CO2 16 (L) 22 - 32 mmol/L   Glucose, Bld 104 (H) 70 - 99 mg/dL   BUN 161 (H) 8 - 23 mg/dL   Creatinine, Ser 0.96 (H) 0.61 - 1.24 mg/dL   Calcium  8.3 (L) 8.9 - 10.3 mg/dL   Phosphorus 04.5 (H) 2.5 - 4.6 mg/dL   Albumin  2.0 (L) 3.5 - 5.0 g/dL   GFR, Estimated 6 (L) >60 mL/min   Anion gap 21 (H) 5 - 15  CBC with Differential/Platelet     Status: Abnormal   Collection Time: 09/06/23  6:52 AM  Result Value Ref Range   WBC 14.9 (H) 4.0 - 10.5 K/uL   RBC 3.50 (L) 4.22 - 5.81 MIL/uL   Hemoglobin 11.4 (L) 13.0 - 17.0 g/dL   HCT 40.9 (L) 81.1 - 91.4 %   MCV 98.9 80.0 - 100.0 fL   MCH 32.6 26.0 -  34.0 pg   MCHC 32.9 30.0 - 36.0 g/dL   RDW 78.2 (H) 95.6 - 21.3 %   Platelets 324 150 - 400 K/uL   nRBC 0.0 0.0 - 0.2 %   Neutrophils Relative % 90 %   Neutro Abs 13.4 (H) 1.7 - 7.7 K/uL   Lymphocytes Relative 3 %   Lymphs Abs 0.4 (L) 0.7 - 4.0 K/uL   Monocytes Relative 4 %   Monocytes Absolute 0.6 0.1 - 1.0 K/uL   Eosinophils Relative 0 %   Eosinophils Absolute 0.0 0.0 - 0.5 K/uL   Basophils Relative 0 %   Basophils Absolute 0.0 0.0 - 0.1 K/uL   WBC Morphology See Note    RBC Morphology BURR CELLS    Smear Review See Note    nRBC 0 0 /100 WBC   Metamyelocytes Relative 1 %   Myelocytes 2 %   Abs Immature Granulocytes 0.40 (H) 0.00 - 0.07 K/uL   Polychromasia PRESENT   Glucose, capillary     Status: Abnormal   Collection Time: 09/06/23 11:49 AM  Result Value Ref Range   Glucose-Capillary 111 (H) 70 - 99 mg/dL    I have reviewed pertinent nursing notes, vitals, labs, and images as necessary. I have ordered labwork to follow up on as indicated.  I have reviewed the last notes from staff over past 24 hours. I have discussed patient's care plan and test results with nursing staff, CM/SW, and other staff as appropriate.  Time spent: Greater than 50% of the 55 minute visit was spent in counseling/coordination of care for the patient as laid out in the A&P.   LOS: 6 days   Faith Homes, MD [facility] 09/06/2023, 2:42 PM

## 2023-09-06 NOTE — Progress Notes (Signed)
 Physical Therapy Treatment Patient Details Name: Christian [name] MRN: [id number] DOB: [birth year] Today's Date: 09/06/2023   History of Present Illness 78 y.o. male admitted 08/30/23 with RLQ pain, appendicitis and perforated appendix s/p laparoscopic appendectomy 5/6. Post op ileus. PMHx: CAD s/p CABG, HTN, CKD, DMT2, BPH, multiple sclerosis, neuropathy, mild dementia    PT Comments  Pt pleasant and reports pain left flank limiting all mobility. In sitting pt keeping eyes closed despite cues and EOB pt would not use upper body to push into mattress to assist with scooting stating it was too low. Pt self-limiting but able to progress from prior session and will continue to follow to progress function and mobility. Patient will benefit from continued inpatient follow up therapy, <3 hours/day.     If plan is discharge home, recommend the following: A lot of help with walking and/or transfers;A lot of help with bathing/dressing/bathroom;Assistance with cooking/housework;Assist for transportation;Help with stairs or ramp for entrance   Can travel by private vehicle     No  Equipment Recommendations  Hospital bed;Hoyer lift    Recommendations for Other Services       Precautions / Restrictions Precautions Precautions: Fall Recall of Precautions/Restrictions: Impaired Precaution/Restrictions Comments: abdominal incision, JP drain     Mobility  Bed Mobility Overal bed mobility: Needs Assistance Bed Mobility: Rolling, Sidelying to Sit, Sit to Supine Rolling: Mod assist Sidelying to sit: Mod assist, +2 for safety/equipment, HOB elevated, Used rails   Sit to supine: Min assist   General bed mobility comments: mod assist of pad and rail to roll to right, mod +2 assist to clear legs and elevate trunk to sitting with max assist of pad to square hips EOB. Pt with continued posterior right bias in sitting and min assist for balance. Max +2 assist to scoot toward HOB in sitting. min assist to  clear legs to surface with return to supine. In [place] with head board and cues for sequence pt able to slide up in bed with min assist    Transfers                   General transfer comment: pt denied    Ambulation/Gait                   Stairs             Wheelchair Mobility     Tilt Bed    Modified Rankin (Stroke Patients Only)       Balance Overall balance assessment: Needs assistance   Sitting balance-Leahy Scale: Poor Sitting balance - Comments: min assist sitting balance                                    Communication Communication Communication: No apparent difficulties  Cognition Arousal: Alert Behavior During Therapy: Flat affect   PT - Cognitive impairments: No family/caregiver present to determine baseline, Problem solving, Safety/Judgement                       PT - Cognition Comments: pt keeping eyes closed despite cues to open then would state "you may think they are closed but they are open" Following commands: Impaired Following commands impaired: Follows one step commands with increased time, Follows one step commands inconsistently    Cueing Cueing Techniques: Verbal cues, Tactile cues, Gestural cues  Exercises      General Comments  Pertinent Vitals/Pain Pain Assessment Pain Assessment: 0-10 Pain Score: 7  Pain Location: left flank Pain Descriptors / Indicators: Aching, Sore Pain Intervention(s): Limited activity within patient's tolerance, Monitored during session, Repositioned    Home Living                          Prior Function            PT Goals (current goals can now be found in the care plan section) Progress towards PT goals: Progressing toward goals    Frequency    Min 2X/week      PT Plan      Co-evaluation              AM-PAC PT "6 Clicks" Mobility   Outcome Measure  Help needed turning from your back to your side while in a  flat bed without using bedrails?: A Lot Help needed moving from lying on your back to sitting on the side of a flat bed without using bedrails?: Total Help needed moving to and from a bed to a chair (including a wheelchair)?: Total Help needed standing up from a chair using your arms (e.g., wheelchair or bedside chair)?: Total Help needed to walk in hospital room?: Total Help needed climbing 3-5 steps with a railing? : Total 6 Click Score: 7    End of Session   Activity Tolerance: Patient limited by pain Patient left: in bed;with call bell/phone within reach;with bed alarm set Nurse Communication: Mobility status;Need for lift equipment PT Visit Diagnosis: Unsteadiness on feet (R26.81);Other abnormalities of gait and mobility (R26.89);Muscle weakness (generalized) (M62.81)     Time: 1610-9604 PT Time Calculation (min) (ACUTE ONLY): 20 min  Charges:    $Therapeutic Activity: 8-22 mins PT General Charges $$ ACUTE PT VISIT: 1 Visit                     [name], PT Acute Rehabilitation Services Office: [phone]    Christian [name] 09/06/2023, 10:13 AM

## 2023-09-06 NOTE — Progress Notes (Signed)
 Patient refused to sign procedural consent

## 2023-09-06 NOTE — Plan of Care (Signed)
   Problem: Education: Goal: Ability to describe self-care measures that may prevent or decrease complications (Diabetes Survival Skills Education) will improve Outcome: Progressing

## 2023-09-06 NOTE — H&P (View-Only) (Signed)
 [name] NOTE   MRN : [id number]  Reason for Consult: weak thrill left UE AV fistula Referring Physician: Nephrology Christian [name]  History of Present Illness: 78 y/o male with AKI on CKD.  Dr. Charlotte [name] created a left  basilic av fistula on 10/31/20.  Of note he has a history of left radial artery harvested for CABG.  He was found to have low flow in the fistula 08/12/22 and Dr. Rosalva [name] performed a fistulogram  with Balloon angioplasty of the peripheral portion of the fistula using a 5 x 40 mm, with subsequent 6 x 5 mm Viabahn stenting.    His wife is in the room and answered my questions.  He spoke some but had her be the main communicator.  She states the fistula was accessed last April  2024 and then he was no longer considered to be ESRD.    Recent history 08/28/23 Christian [name] started having abdominal pain with vomiting.   CT abdomen pelvis in the ER shows features concerning for appendicitis with possible periappendiceal abscess.  Cardiology was consult prior to surgery with Dr. Aniceto [name] for laparoscopic appendectomy which showed he had a perforated appendix.    He developed bouts of hypotension with elevating Cr.  We have been asked to evaluate his fistula with high probability of needing HD.  Cr today 8.15.  Urine OP 500 may 02/2024 total.      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, HTN, HLD, DM, CKD, PVD         Current Facility-Administered Medications  Medication Dose Route Frequency Provider Last Rate Last Admin   acetaminophen  (OFIRMEV ) IV 1,000 mg  1,000 mg Intravenous Q6H Christian Harry, MD       albuterol  (PROVENTIL ) (2.5 MG/3ML) 0.083% nebulizer solution 2.5 mg  2.5 mg Nebulization Q4H PRN Christian Simmonds, PA-C       dextrose  50 % solution 12.5 g  12.5 g Intravenous PRN Rathore, Vasundhra, MD   12.5 g at 09/04/23 1747   feeding supplement (BOOST / RESOURCE BREEZE) liquid 1 Container  1 Container Oral TID BM Christian Homes,  MD   1 Container at 09/05/23 0857   fluticasone  furoate-vilanterol (BREO ELLIPTA ) 200-25 MCG/ACT 1 puff  1 puff Inhalation Daily Christian Simmonds, PA-C   1 puff at 09/06/23 0853   hydrALAZINE  (APRESOLINE ) injection 10 mg  10 mg Intravenous Q4H PRN Christian Simmonds, PA-C       LORazepam  (ATIVAN ) injection 0.5 mg  0.5 mg Intravenous Q4H PRN Christian Simmonds, PA-C   0.5 mg at 09/01/23 7846   methocarbamol  (ROBAXIN ) injection 500 mg  500 mg Intravenous Q8H PRN Christian Harry, MD       morphine  (PF) 2 MG/ML injection 2 mg  2 mg Intravenous Q3H PRN Lovick, Ayesha N, MD   2 mg at 09/05/23 2010   ondansetron  (ZOFRAN ) injection 4 mg  4 mg Intravenous Q6H PRN Christian Simmonds, PA-C   4 mg at 09/05/23 2304   piperacillin -tazobactam (ZOSYN ) IVPB 2.25 g  2.25 g Intravenous Q8H Christian [name], Christian [name] 100 mL/hr at 09/06/23 0550 2.25 g at 09/06/23 0550   simethicone  (MYLICON) 40 MG/0.6ML suspension 40 mg  40 mg Oral QID PRN Christian Simmonds, PA-C   40 mg at 09/05/23 9629   sodium bicarbonate  150 mEq in sterile water  1,150 mL infusion   Intravenous Continuous Christian Cutting, MD 100 mL/hr at 09/06/23 0951 New Bag at 09/06/23 0951   traMADol  (ULTRAM ) tablet 50  mg  50 mg Oral Q4H PRN Lovick, Ayesha N, MD   50 mg at 09/05/23 1758    Pt meds include: Statin :Yes Betablocker: Yes ASA: No Other anticoagulants/antiplatelets: Plavix   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   Diabetic polyneuropathy    Elevated liver enzymes 08/09/2019   Hiatal hernia    History of coronary artery stent placement    History of pulmonary embolism 08/09/2019   HTN (hypertension) 11/12/2009   Hyperlipidemia associated with type 2 diabetes  mellitus 11/12/2009   Left leg weakness 11/23/2018   Mild dementia, 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 LIMA TO LAD, RIMA TO PDA, RADIAL ARTERY TO CIRC AND SEQUENTIALLY TO OM1.   Sleep difficulties 05/30/2020   Type II diabetes mellitus 10/10/2014    Past Surgical History:  Procedure Laterality Date   A/V FISTULAGRAM Left 08/12/2022   Procedure: A/V Fistulagram;  Surgeon: Kayla Part, MD;  Location: [facility] INVASIVE CV LAB;  Service: Cardiovascular;  Laterality: Left;   APPLICATION OF WOUND VAC [name]/A 08/24/2019   Procedure: APPLICATION OF WOUND VAC;  Surgeon: Rudine Cos, MD;  Location: [facility] OR;  Service: Thoracic;  Laterality: [name]/A;   APPLICATION OF WOUND VAC  08/29/2019   Procedure: Wound Vac change;  Surgeon: Rudine Cos, MD;  Location: [facility] OR;  Service: Open Heart Surgery;;   APPLICATION OF WOUND VAC [name]/A 09/04/2019   Procedure: Application Of Wound Vac;  Surgeon: Norita Beauvais, MD;  Location: [facility] OR;  Service: Open Heart Surgery;  Laterality: [name]/A;   APPLICATION OF WOUND VAC [name]/A 09/06/2019   Procedure: APPLICATION OF ACELL, APPLICATION OF WOUND VAC USING PREVENA INCISIONAL  DRESSING;  Surgeon: Thornell Flirt, DO;  Location: [facility] OR;  Service: Plastics;  Laterality: [name]/A;   AV FISTULA PLACEMENT Left 09/18/2020   Procedure: ARTERIOVENOUS (AV) FISTULA CREATION LEFT VERSUS GRAFT;  Surgeon: Margherita Shell, MD;  Location: [facility] OR;  Service: Vascular;  Laterality: Left;   BASCILIC VEIN TRANSPOSITION Left 10/31/2020   Procedure: LEFT SECOND STAGE BASCILIC VEIN TRANSPOSITION;  Surgeon: Margherita Shell, MD;  Location: [facility] OR;  Service: Vascular;  Laterality: Left;   CARDIAC CATHETERIZATION     CORONARY ARTERY BYPASS GRAFT [name]/A 08/15/2019   Procedure: CORONARY ARTERY BYPASS GRAFTING (CABG), x 4 using bilateral IMAs and left radial  artery .  LIMA TO LAD, RIMA TO PDA, RADIAL ARTERY TO CIRC AND SEQUENTIALLY TO OM1.;  Surgeon: Rudine Cos, MD;  Location: [facility] OR;  Service: Open Heart Surgery;  Laterality: [name]/A;   CORONARY STENT PLACEMENT  02/27/2014   distal rt/pd coronary       dr Berry Bristol   CYSTO/ BLADDER BIOPSY'S/ CAUTHERIZATION  01-14-2004  DR Isla Mari   EXPLORATION POST OPERATIVE OPEN HEART [name]/A 08/16/2019   Procedure: Chest Closure S?P CABG WITH APPLICATION OF PREVENA  INCISIONAL WOUND VAC;  Surgeon: Rudine Cos, MD;  Location: [facility] OR;  Service: Open Heart Surgery;  Laterality: [name]/A;   EXPLORATION POST OPERATIVE OPEN HEART [name]/A 08/21/2019   Procedure: CHEST WASHOUT S/P  OPEN CHEST;  Surgeon: Rudine Cos, MD;  Location: [facility] OR;  Service: Open Heart Surgery;  Laterality: [name]/A;  Open chest with Esmark dressing with Ioban sealant coverage.   EXPLORATION POST OPERATIVE OPEN HEART [name]/A 08/18/2019   Procedure: EXPLORATION POST OPERATIVE OPEN HEART (performed 04/23 on unit);  Surgeon: Rudine Cos, MD;  Location: [facility] OR;  Service: Open Heart Surgery;  Laterality: [name]/A;   EXPLORATION POST OPERATIVE OPEN HEART [name]/A 08/24/2019   Procedure: CHEST WASHOUT POST OPERATIVE OPEN HEART;  Surgeon: Rudine Cos, MD;  Location: [facility] OR;  Service: Open Heart Surgery;  Laterality: [name]/A;   EXPLORATION POST OPERATIVE OPEN HEART [name]/A 08/29/2019   Procedure: CHEST WOUND WASHOUT POST OPERATIVE OPEN HEART;  Surgeon: Rudine Cos, MD;  Location: [facility] OR;  Service: Open Heart Surgery;  Laterality: [name]/A;   EXPLORATION POST OPERATIVE OPEN HEART [name]/A 09/04/2019   Procedure: MEDIASTINAL EXPLORATION WITH STERNAL WOUND IRRIGATION;  Surgeon: Norita Beauvais, MD;  Location: [facility] OR;  Service: Open Heart Surgery;  Laterality: [name]/A;   EXPLORATION POST OPERATIVE OPEN HEART [name]/A 09/14/2019   Procedure: EVACUATION OF HEMATOMA;  Surgeon: Norita Beauvais, MD;  Location: [facility] OR;  Service: Open Heart Surgery;  Laterality: [name]/A;   IR FLUORO GUIDE CV LINE LEFT   09/19/2019   IR GASTROSTOMY TUBE MOD SED  10/04/2019   IR GASTROSTOMY TUBE REMOVAL  11/29/2019   IR PATIENT EVAL TECH 0-60 MINS  09/29/2019   IR US  GUIDE VASC ACCESS LEFT  09/19/2019   LAPAROSCOPIC APPENDECTOMY [name]/A 08/31/2023   Procedure: APPENDECTOMY, LAPAROSCOPIC;  Surgeon: Anda Bamberg, MD;  Location: [facility] OR;  Service: General;  Laterality: [name]/A;   LAPAROSCOPIC LYSIS OF ADHESIONS [name]/A 09/06/2019   Procedure: LAPAROSCOPIC OMENTAL HARVEST;  Surgeon: Derral Flick, MD;  Location: [facility] OR;  Service: General;  Laterality: [name]/A;   LEFT HEART CATH AND CORONARY ANGIOGRAPHY [name]/A 08/10/2019   Procedure: LEFT HEART CATH AND CORONARY ANGIOGRAPHY;  Surgeon: Cody Das, MD;  Location: [facility] INVASIVE CV LAB;  Service: Cardiovascular;  Laterality: [name]/A;   LEFT HEART CATHETERIZATION WITH CORONARY ANGIOGRAM [name]/A 02/27/2014   Procedure: LEFT HEART CATHETERIZATION WITH CORONARY ANGIOGRAM;  Surgeon: Jessica Morn, MD;  Location: [name] CATH LAB;  Service: Cardiovascular;  Laterality: [name]/A;   MEDIASTINAL EXPLORATION [name]/A 09/06/2019   Procedure: MEDIASTINAL EXPLORATION;  Surgeon: Norita Beauvais, MD;  Location: [facility] OR;  Service: Thoracic;  Laterality: [name]/A;   PECTORALIS FLAP  09/06/2019   Procedure: Pectoralis ADVANCEMENT Flap;  Surgeon: Thornell Flirt, DO;  Location: [facility] OR;  Service: Plastics;;   PERCUTANEOUS CORONARY STENT INTERVENTION (PCI-S)  02/27/2014   Procedure: PERCUTANEOUS CORONARY STENT INTERVENTION (PCI-S);  Surgeon: Jessica Morn, MD;  Location: [facility] CATH LAB;  Service: Cardiovascular;;  rt PDA  3.0/28mm Promus stent   PERIPHERAL VASCULAR INTERVENTION Left 08/12/2022   Procedure: PERIPHERAL VASCULAR INTERVENTION;  Surgeon: Kayla Part, MD;  Location: [facility] INVASIVE CV LAB;  Service: Cardiovascular;  Laterality: Left;   RADIAL ARTERY HARVEST Left 08/15/2019   Procedure: Radial Artery Harvest;  Surgeon: Rudine Cos, MD;  Location: [facility] OR;  Service: Open Heart Surgery;  Laterality: Left;   RIB  PLATING [name]/A 09/06/2019   Procedure: STERNAL PLATING;  Surgeon: Norita Beauvais, MD;  Location: [facility] OR;  Service: Thoracic;  Laterality: [name]/A;   TEE WITHOUT CARDIOVERSION [name]/A 08/15/2019   Procedure: TRANSESOPHAGEAL ECHOCARDIOGRAM (TEE);  Surgeon: Rudine Cos, MD;  Location: [facility] OR;  Service: Open Heart Surgery;  Laterality: [name]/A;  TRACHEOSTOMY TUBE PLACEMENT  08/29/2019   Procedure: Tracheostomy;  Surgeon: Rudine Cos, MD;  Location: [facility] OR;  Service: Open Heart Surgery; Decannulated 6/10/20211   TRANSURETHRAL RESECTION OF PROSTATE  04/04/2012   Procedure: TRANSURETHRAL RESECTION OF THE PROSTATE WITH GYRUS INSTRUMENTS;  Surgeon: Edmund Gouge, MD;  Location: [facility];  Service: Urology;  Laterality: [name]/A;   TRANSURETHRAL RESECTION OF PROSTATE [name]/A 09/27/2014   Procedure: TRANSURETHRAL RESECTION OF THE PROSTATE ;  Surgeon: Annamarie Kid, MD;  Location: [facility] ORS;  Service: Urology;  Laterality: [name]/A;   UPPER GASTROINTESTINAL ENDOSCOPY      Social History Social History   Tobacco Use   Smoking status: Former    Current packs/day: 0.00    Average packs/day: 0.5 packs/day for 20.0 years (10.0 ttl pk-yrs)    Types: Cigarettes    Start date: 32    Quit date: 20    Years since quitting: 50.3   Smokeless tobacco: Never  Vaping Use   Vaping status: Never Used  Substance Use Topics   Alcohol use: Not Currently    Comment: very rarely - every now and then 1 drink/1 year if that   Drug use: No    Family History Family History  Problem Relation Age of Onset   Diabetes Mother    Hypertension Mother    Diabetes Father    Diabetes Brother    Hypertension Brother    Bone cancer Brother    Diabetes Brother     No Known Allergies   REVIEW OF SYSTEMS  General: [ ]  Weight loss, [ ]  Fever, [ ]  chills Neurologic: [ ]  Dizziness, [ ]  Blackouts, [ ]  Seizure [ ]  Stroke, [ ]  "Mini stroke", [ ]  Slurred speech, [ ]  Temporary blindness; [ ]  weakness in arms or  legs, [ ]  Hoarseness [ ]  Dysphagia Cardiac: [ ]  Chest pain/pressure, [ ]  Shortness of breath at rest [ ]  Shortness of breath with exertion, [ ]  Atrial fibrillation or irregular heartbeat  Vascular: [ ]  Pain in legs with walking, [ ]  Pain in legs at rest, [ ]  Pain in legs at night,  [ ]  Non-healing ulcer, [ ]  Blood clot in vein/DVT,   Pulmonary: [ ]  Home oxygen, [ ]  Productive cough, [ ]  Coughing up blood, [ ]  Asthma,  [ ]  Wheezing [ ]  COPD Musculoskeletal:  [ ]  Arthritis, [ ]  Low back pain, [ ]  Joint pain Hematologic: [ ]  Easy Bruising, [ ]  Anemia; [ ]  Hepatitis Gastrointestinal: [ ]  Blood in stool, [ ]  Gastroesophageal Reflux/heartburn, Urinary: x[ ]  chronic Kidney disease, [ ]  on HD - [ ]  MWF or [ ]  TTHS, [ ]  Burning with urination, [ ]  Difficulty urinating Skin: [ ]  Rashes, [ ]  Wounds Psychological: [ ]  Anxiety, [ ]  Depression  Physical Examination Vitals:   09/06/23 0427 09/06/23 0500 09/06/23 0817 09/06/23 0854  BP: 130/78  127/78   Pulse: (!) 115  (!) 109   Resp: 18  19   Temp: 99 F (37.2 C)  98.5 F (36.9 C)   TempSrc: Oral  Oral   SpO2: 97%  98% 96%  Weight:  96.3 kg    Height:       Body mass index is 31.34 kg/m.  General:  ill apearing  HENT: WNL  Eyes: Pupils equal Pulmonary: normal non-labored breathing , without Rales, rhonchi,  wheezing Cardiac: RRR, without  Murmurs, rubs or gallops; No carotid bruits Abdomen: persistent ileus, NG tube in place for bowel  rest Skin: no rashes, ulcers noted;  no Gangrene , no cellulitis; no open wounds;   Vascular Exam/Pulses:weak thrill in the left UE, palpable radial pulses intact   Musculoskeletal: no muscle wasting or atrophy; no edema  Neurologic: A&O X 3; Appropriate Affect ;  SENSATION: normal MOTOR FUNCTION: grossly 5/5 Symmetric Speech weak   Significant Diagnostic Studies: CBC Lab Results  Component Value Date   WBC 14.9 (H) 09/06/2023   HGB 11.4 (L) 09/06/2023   HCT 34.6 (L) 09/06/2023   MCV 98.9  09/06/2023   PLT 324 09/06/2023    BMET    Component Value Date/Time   NA 136 09/06/2023 0652   NA 143 04/16/2022 0000   K 4.4 09/06/2023 0652   CL 99 09/06/2023 0652   CO2 16 (L) 09/06/2023 0652   GLUCOSE 104 (H) 09/06/2023 0652   BUN 119 (H) 09/06/2023 0652   BUN 35 (H) 05/22/2021 1551   CREATININE 8.15 (H) 09/06/2023 0652   CREATININE 2.87 (H) 12/20/2019 1312   CALCIUM  8.3 (L) 09/06/2023 0652   GFRNONAA 6 (L) 09/06/2023 0652   GFRAA 19 (L) 10/18/2019 0046   Estimated Creatinine Clearance: 8.8 mL/min (A) (by C-G formula based on SCr of 8.15 mg/dL (H)).  COAG Lab Results  Component Value Date   INR 1.1 10/04/2019   INR 1.2 09/14/2019   INR 1.3 (H) 08/19/2019     Non-Invasive Vascular Imaging:  Vein mapping and arterial duplex    ASSESSMENT/PLAN:  AKI on CKD with history of HD for 1 1/2 years last HD April 2024.    He has had a left basilic AV fistula since 10/31/20 with follow up intervention fistulogram and stenting with angioplasty in 08/12/2022.     He has a weak thrill in the left Basilic AV fistula.  He was scheduled to see us  in the office today.  I have ordered Arterial duplex of the left UE with B UE vein mapping.  He may benefit from fistulogram pending out come.   Hopefully we can salvage the fistula verse possible TDC placement and new access.  Dr. Rosalva [name] will follow later today.  He is NPO  Surgery following for persistent ileus, NG tube in place for bowel rest -continue IV Zosyn  due to perforation   Christian [name] 09/06/2023 10:36 AM  VASCULAR STAFF ADDENDUM: I have independently interviewed and examined the patient. I agree with the above.  I had a nice conversation with Christian [name], and called his wife regarding need for tunneled dialysis catheter and fistulogram.  I am concerned that the fistula will likely not mature, especially being that it has undergone previous venoplasty as well as stenting.  My guess is that there is narrowing at the proximal  and distal aspect of the stent.  Will fistulogram as indicated, depending findings, may need to be abandoned. We also discussed the need for tunneled dialysis catheter.  This will also be placed at the time of fistulogram.  After discussing the risks and benefits, his wife elected to proceed.  Please make [name].p.o. midnight.  Kayla Part MD [facility] Phone Number: [phone] 09/06/2023 5:07 PM

## 2023-09-06 NOTE — Consult Note (Addendum)
 [name] NOTE   MRN : [id number]  Reason for Consult: weak thrill left UE AV fistula Referring Physician: Nephrology [name]  History of Present Illness: 78 y/o male with AKI on CKD.  Dr. Charlotte Cookey created a left  basilic av fistula on 10/31/20.  Of note he has a history of left radial artery harvested for CABG.  He was found to have low flow in the fistula 08/12/22 and Dr. Rosalva Comber performed a fistulogram  with Balloon angioplasty of the peripheral portion of the fistula using a 5 x 40 mm, with subsequent 6 x 5 mm Viabahn stenting.    His wife is in the room and answered my questions.  He spoke some but had her be the main communicator.  She states the fistula was accessed last April  2024 and then he was no longer considered to be ESRD.    Recent history 08/28/23 [name] started having abdominal pain with vomiting.   CT abdomen pelvis in the ER shows features concerning for appendicitis with possible periappendiceal abscess.  Cardiology was consult prior to surgery with [name] for laparoscopic appendectomy which showed he had a perforated appendix.    He developed bouts of hypotension with elevating Cr.  We have been asked to evaluate his fistula with high probability of needing HD.  Cr today 8.15.  Urine OP 500 may 02/2024 total.      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, HTN, HLD, DM, CKD, PVD         Current Facility-Administered Medications  Medication Dose Route Frequency Provider Last Rate Last Admin   acetaminophen  (OFIRMEV ) IV 1,000 mg  1,000 mg Intravenous Q6H Enid Harry, MD       albuterol  (PROVENTIL ) (2.5 MG/3ML) 0.083% nebulizer solution 2.5 mg  2.5 mg Nebulization Q4H PRN Marlin Simmonds, PA-C       dextrose  50 % solution 12.5 g  12.5 g Intravenous PRN Rathore, Vasundhra, MD   12.5 g at 09/04/23 1747   feeding supplement (BOOST / RESOURCE BREEZE) liquid 1 Container  1 Container Oral TID BM Girguis, David,  MD   1 Container at 09/05/23 0857   fluticasone  furoate-vilanterol (BREO ELLIPTA ) 200-25 MCG/ACT 1 puff  1 puff Inhalation Daily Marlin Simmonds, PA-C   1 puff at 09/06/23 4098   hydrALAZINE  (APRESOLINE ) injection 10 mg  10 mg Intravenous Q4H PRN Marlin Simmonds, PA-C       LORazepam  (ATIVAN ) injection 0.5 mg  0.5 mg Intravenous Q4H PRN Marlin Simmonds, PA-C   0.5 mg at 09/01/23 1191   methocarbamol  (ROBAXIN ) injection 500 mg  500 mg Intravenous Q8H PRN Enid Harry, MD       morphine  (PF) 2 MG/ML injection 2 mg  2 mg Intravenous Q3H PRN Lovick, Ayesha N, MD   2 mg at 09/05/23 2010   ondansetron  (ZOFRAN ) injection 4 mg  4 mg Intravenous Q6H PRN Marlin Simmonds, PA-C   4 mg at 09/05/23 2304   piperacillin -tazobactam (ZOSYN ) IVPB 2.25 g  2.25 g Intravenous Q8H Dang, Thuy D, RPH 100 mL/hr at 09/06/23 0550 2.25 g at 09/06/23 0550   simethicone  (MYLICON) 40 MG/0.6ML suspension 40 mg  40 mg Oral QID PRN Marlin Simmonds, PA-C   40 mg at 09/05/23 4782   sodium bicarbonate  150 mEq in sterile water  1,150 mL infusion   Intravenous Continuous Clementine Cutting, MD 100 mL/hr at 09/06/23 0951 New Bag at 09/06/23 0951   traMADol  (ULTRAM ) tablet 50  mg  50 mg Oral Q4H PRN Lovick, Ayesha N, MD   50 mg at 09/05/23 1758    Pt meds include: Statin :Yes Betablocker: Yes ASA: No Other anticoagulants/antiplatelets: Plavix   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   Diabetic polyneuropathy    Elevated liver enzymes 08/09/2019   Hiatal hernia    History of coronary artery stent placement    History of pulmonary embolism 08/09/2019   HTN (hypertension) 11/12/2009   Hyperlipidemia associated with type 2 diabetes  mellitus 11/12/2009   Left leg weakness 11/23/2018   Mild dementia, 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 LIMA TO LAD, RIMA TO PDA, RADIAL ARTERY TO CIRC AND SEQUENTIALLY TO OM1.   Sleep difficulties 05/30/2020   Type II diabetes mellitus 10/10/2014    Past Surgical History:  Procedure Laterality Date   A/V FISTULAGRAM Left 08/12/2022   Procedure: A/V Fistulagram;  Surgeon: Kayla Part, MD;  Location: [facility] INVASIVE CV LAB;  Service: Cardiovascular;  Laterality: Left;   APPLICATION OF WOUND VAC N/A 08/24/2019   Procedure: APPLICATION OF WOUND VAC;  Surgeon: Rudine Cos, MD;  Location: [facility] OR;  Service: Thoracic;  Laterality: N/A;   APPLICATION OF WOUND VAC  08/29/2019   Procedure: Wound Vac change;  Surgeon: Rudine Cos, MD;  Location: [facility] OR;  Service: Open Heart Surgery;;   APPLICATION OF WOUND VAC N/A 09/04/2019   Procedure: Application Of Wound Vac;  Surgeon: Norita Beauvais, MD;  Location: [facility] OR;  Service: Open Heart Surgery;  Laterality: N/A;   APPLICATION OF WOUND VAC N/A 09/06/2019   Procedure: APPLICATION OF ACELL, APPLICATION OF WOUND VAC USING PREVENA INCISIONAL  DRESSING;  Surgeon: Thornell Flirt, DO;  Location: [facility] OR;  Service: Plastics;  Laterality: N/A;   AV FISTULA PLACEMENT Left 09/18/2020   Procedure: ARTERIOVENOUS (AV) FISTULA CREATION LEFT VERSUS GRAFT;  Surgeon: Margherita Shell, MD;  Location: [facility] OR;  Service: Vascular;  Laterality: Left;   BASCILIC VEIN TRANSPOSITION Left 10/31/2020   Procedure: LEFT SECOND STAGE BASCILIC VEIN TRANSPOSITION;  Surgeon: Margherita Shell, MD;  Location: [facility] OR;  Service: Vascular;  Laterality: Left;   CARDIAC CATHETERIZATION     CORONARY ARTERY BYPASS GRAFT N/A 08/15/2019   Procedure: CORONARY ARTERY BYPASS GRAFTING (CABG), x 4 using bilateral IMAs and left radial  artery .  LIMA TO LAD, RIMA TO PDA, RADIAL ARTERY TO CIRC AND SEQUENTIALLY TO OM1.;  Surgeon: Rudine Cos, MD;  Location: [facility] OR;  Service: Open Heart Surgery;  Laterality: N/A;   CORONARY STENT PLACEMENT  02/27/2014   distal rt/pd coronary       dr Berry Bristol   CYSTO/ BLADDER BIOPSY'S/ CAUTHERIZATION  01-14-2004  DR Isla Mari   EXPLORATION POST OPERATIVE OPEN HEART N/A 08/16/2019   Procedure: Chest Closure S?P CABG WITH APPLICATION OF PREVENA  INCISIONAL WOUND VAC;  Surgeon: Rudine Cos, MD;  Location: [facility] OR;  Service: Open Heart Surgery;  Laterality: N/A;   EXPLORATION POST OPERATIVE OPEN HEART N/A 08/21/2019   Procedure: CHEST WASHOUT S/P  OPEN CHEST;  Surgeon: Rudine Cos, MD;  [facility] OR;  Service: Open Heart Surgery;  Laterality: N/A;  Open chest with Esmark dressing with Ioban sealant coverage.   EXPLORATION POST OPERATIVE OPEN HEART N/A 08/18/2019   Procedure: EXPLORATION POST OPERATIVE OPEN HEART (performed 04/23 on unit);  Surgeon: Rudine Cos, MD;  Location: [facility] OR;  Service: Open Heart Surgery;  Laterality: N/A;   EXPLORATION POST OPERATIVE OPEN HEART N/A 08/24/2019   Procedure: CHEST WASHOUT POST OPERATIVE OPEN HEART;  Surgeon: Rudine Cos, MD;  [facility]: [facility] OR;  Service: Open Heart Surgery;  Laterality: N/A;   EXPLORATION POST OPERATIVE OPEN HEART N/A 08/29/2019   Procedure: CHEST WOUND WASHOUT POST OPERATIVE OPEN HEART;  Surgeon: Rudine Cos, MD;  [facility]: [facility] OR;  Service: Open Heart Surgery;  Laterality: N/A;   EXPLORATION POST OPERATIVE OPEN HEART N/A 09/04/2019   Procedure: MEDIASTINAL EXPLORATION WITH STERNAL WOUND IRRIGATION;  Surgeon: Norita Beauvais, MD;  Location: [facility] OR;  Service: Open Heart Surgery;  Laterality: N/A;   EXPLORATION POST OPERATIVE OPEN HEART N/A 09/14/2019   Procedure: EVACUATION OF HEMATOMA;  Surgeon: Norita Beauvais, MD;  Location: [facility] OR;  Service: Open Heart Surgery;  Laterality: N/A;   IR FLUORO GUIDE CV LINE LEFT   09/19/2019   IR GASTROSTOMY TUBE MOD SED  10/04/2019   IR GASTROSTOMY TUBE REMOVAL  11/29/2019   IR PATIENT EVAL TECH 0-60 MINS  09/29/2019   IR US  GUIDE VASC ACCESS LEFT  09/19/2019   LAPAROSCOPIC APPENDECTOMY N/A 08/31/2023   Procedure: APPENDECTOMY, LAPAROSCOPIC;  Surgeon: Anda Bamberg, MD;  [facility] OR;  Service: General;  Laterality: N/A;   LAPAROSCOPIC LYSIS OF ADHESIONS N/A 09/06/2019   Procedure: LAPAROSCOPIC OMENTAL HARVEST;  Surgeon: Derral Flick, MD;  Location: [facility] OR;  Service: General;  Laterality: N/A;   LEFT HEART CATH AND CORONARY ANGIOGRAPHY N/A 08/10/2019   Procedure: LEFT HEART CATH AND CORONARY ANGIOGRAPHY;  Surgeon: Cody Das, MD;  [facility]: [facility] INVASIVE CV LAB;  Service: Cardiovascular;  Laterality: N/A;   LEFT HEART CATHETERIZATION WITH CORONARY ANGIOGRAM N/A 02/27/2014   Procedure: LEFT HEART CATHETERIZATION WITH CORONARY ANGIOGRAM;  Surgeon: Jessica Morn, MD;  Location: [facility] CATH LAB;  Service: Cardiovascular;  Laterality: N/A;   MEDIASTINAL EXPLORATION N/A 09/06/2019   Procedure: MEDIASTINAL EXPLORATION;  Surgeon: Norita Beauvais, MD;  Location: [facility] OR;  Service: Thoracic;  Laterality: N/A;   PECTORALIS FLAP  09/06/2019   Procedure: Pectoralis ADVANCEMENT Flap;  Surgeon: Thornell Flirt, DO;  Location: [facility] OR;  Service: Plastics;;   PERCUTANEOUS CORONARY STENT INTERVENTION (PCI-S)  02/27/2014   Procedure: PERCUTANEOUS CORONARY STENT INTERVENTION (PCI-S);  Surgeon: Jessica Morn, MD;  Location: [facility] CATH LAB;  Service: Cardiovascular;;  rt PDA  3.0/28mm Promus stent   PERIPHERAL VASCULAR INTERVENTION Left 08/12/2022   Procedure: PERIPHERAL VASCULAR INTERVENTION;  Surgeon: Kayla Part, MD;  Location: [facility] INVASIVE CV LAB;  Service: Cardiovascular;  Laterality: Left;   RADIAL ARTERY HARVEST Left 08/15/2019   Procedure: Radial Artery Harvest;  Surgeon: Rudine Cos, MD;  Location: [facility] OR;  Service: Open Heart Surgery;  Laterality: Left;   RIB  PLATING N/A 09/06/2019   Procedure: STERNAL PLATING;  Surgeon: Norita Beauvais, MD;  Location: [facility] OR;  Service: Thoracic;  Laterality: N/A;   TEE WITHOUT CARDIOVERSION N/A 08/15/2019   Procedure: TRANSESOPHAGEAL ECHOCARDIOGRAM (TEE);  Surgeon: Rudine Cos, MD;  Location: [facility] OR;  Service: Open Heart Surgery;  Laterality: N/A;  TRACHEOSTOMY TUBE PLACEMENT  08/29/2019   Procedure: Tracheostomy;  Surgeon: Rudine Cos, MD;  Location: [facility] OR;  Service: Open Heart Surgery; Decannulated 6/10/20211   TRANSURETHRAL RESECTION OF PROSTATE  04/04/2012   Procedure: TRANSURETHRAL RESECTION OF THE PROSTATE WITH GYRUS INSTRUMENTS;  Surgeon: Edmund Gouge, MD;  Location: [facility];  Service: Urology;  Laterality: N/A;   TRANSURETHRAL RESECTION OF PROSTATE N/A 09/27/2014   Procedure: TRANSURETHRAL RESECTION OF THE PROSTATE ;  Surgeon: Annamarie Kid, MD;  Location: [facility] ORS;  Service: Urology;  Laterality: N/A;   UPPER GASTROINTESTINAL ENDOSCOPY      Social History Social History   Tobacco Use   Smoking status: Former    Current packs/day: 0.00    Average packs/day: 0.5 packs/day for 20.0 years (10.0 ttl pk-yrs)    Types: Cigarettes    Start date: 77    Quit date: 75    Years since quitting: 50.3   Smokeless tobacco: Never  Vaping Use   Vaping status: Never Used  Substance Use Topics   Alcohol use: Not Currently    Comment: very rarely - every now and then 1 drink/1 year if that   Drug use: No    Family History Family History  Problem Relation Age of Onset   Diabetes Mother    Hypertension Mother    Diabetes Father    Diabetes Brother    Hypertension Brother    Bone cancer Brother    Diabetes Brother     No Known Allergies   REVIEW OF SYSTEMS  General: [ ]  Weight loss, [ ]  Fever, [ ]  chills Neurologic: [ ]  Dizziness, [ ]  Blackouts, [ ]  Seizure [ ]  Stroke, [ ]  "Mini stroke", [ ]  Slurred speech, [ ]  Temporary blindness; [ ]  weakness in arms or  legs, [ ]  Hoarseness [ ]  Dysphagia Cardiac: [ ]  Chest pain/pressure, [ ]  Shortness of breath at rest [ ]  Shortness of breath with exertion, [ ]  Atrial fibrillation or irregular heartbeat  Vascular: [ ]  Pain in legs with walking, [ ]  Pain in legs at rest, [ ]  Pain in legs at night,  [ ]  Non-healing ulcer, [ ]  Blood clot in vein/DVT,   Pulmonary: [ ]  Home oxygen, [ ]  Productive cough, [ ]  Coughing up blood, [ ]  Asthma,  [ ]  Wheezing [ ]  COPD Musculoskeletal:  [ ]  Arthritis, [ ]  Low back pain, [ ]  Joint pain Hematologic: [ ]  Easy Bruising, [ ]  Anemia; [ ]  Hepatitis Gastrointestinal: [ ]  Blood in stool, [ ]  Gastroesophageal Reflux/heartburn, Urinary: x[ ]  chronic Kidney disease, [ ]  on HD - [ ]  MWF or [ ]  TTHS, [ ]  Burning with urination, [ ]  Difficulty urinating Skin: [ ]  Rashes, [ ]  Wounds Psychological: [ ]  Anxiety, [ ]  Depression  Physical Examination Vitals:   09/06/23 0427 09/06/23 0500 09/06/23 0817 09/06/23 0854  BP: 130/78  127/78   Pulse: (!) 115  (!) 109   Resp: 18  19   Temp: 99 F (37.2 C)  98.5 F (36.9 C)   TempSrc: Oral  Oral   SpO2: 97%  98% 96%  Weight:  96.3 kg    Height:       Body mass index is 31.34 kg/m.  General:  ill apearing  HENT: WNL  Eyes: Pupils equal Pulmonary: normal non-labored breathing , without Rales, rhonchi,  wheezing Cardiac: RRR, without  Murmurs, rubs or gallops; No carotid bruits Abdomen: persistent ileus, NG tube in place for bowel  rest Skin: no rashes, ulcers noted;  no Gangrene , no cellulitis; no open wounds;   Vascular Exam/Pulses:weak thrill in the left UE, palpable radial pulses intact   Musculoskeletal: no muscle wasting or atrophy; no edema  Neurologic: A&O X 3; Appropriate Affect ;  SENSATION: normal MOTOR FUNCTION: grossly 5/5 Symmetric Speech weak   Significant Diagnostic Studies: CBC Lab Results  Component Value Date   WBC 14.9 (H) 09/06/2023   HGB 11.4 (L) 09/06/2023   HCT 34.6 (L) 09/06/2023   MCV 98.9  09/06/2023   PLT 324 09/06/2023    BMET    Component Value Date/Time   NA 136 09/06/2023 0652   NA 143 04/16/2022 0000   K 4.4 09/06/2023 0652   CL 99 09/06/2023 0652   CO2 16 (L) 09/06/2023 0652   GLUCOSE 104 (H) 09/06/2023 0652   BUN 119 (H) 09/06/2023 0652   BUN 35 (H) 05/22/2021 1551   CREATININE 8.15 (H) 09/06/2023 0652   CREATININE 2.87 (H) 12/20/2019 1312   CALCIUM  8.3 (L) 09/06/2023 0652   GFRNONAA 6 (L) 09/06/2023 0652   GFRAA 19 (L) 10/18/2019 0046   Estimated Creatinine Clearance: 8.8 mL/min (A) (by C-G formula based on SCr of 8.15 mg/dL (H)).  COAG Lab Results  Component Value Date   INR 1.1 10/04/2019   INR 1.2 09/14/2019   INR 1.3 (H) 08/19/2019     Non-Invasive Vascular Imaging:  Vein mapping and arterial duplex    ASSESSMENT/PLAN:  AKI on CKD with history of HD for 1 1/2 years last HD April 2024.    He has had a left basilic AV fistula since 10/31/20 with follow up intervention fistulogram and stenting with angioplasty in 08/12/2022.     He has a weak thrill in the left Basilic AV fistula.  He was scheduled to see us  in the office today.  I have ordered Arterial duplex of the left UE with B UE vein mapping.  He may benefit from fistulogram pending out come.   Hopefully we can salvage the fistula verse possible TDC placement and new access.  Dr. Rosalva Comber will follow later today.  He is NPO  Surgery following for persistent ileus, NG tube in place for bowel rest -continue IV Zosyn  due to perforation   Christian [name] 09/06/2023 10:36 AM  VASCULAR STAFF ADDENDUM: I have independently interviewed and examined the patient. I agree with the above.  I had a nice conversation with Christian [name], and called his wife regarding need for tunneled dialysis catheter and fistulogram.  I am concerned that the fistula will likely not mature, especially being that it has undergone previous venoplasty as well as stenting.  My guess is that there is narrowing at the proximal  and distal aspect of the stent.  Will fistulogram as indicated, depending findings, may need to be abandoned. We also discussed the need for tunneled dialysis catheter.  This will also be placed at the time of fistulogram.  After discussing the risks and benefits, his wife elected to proceed.  Please make n.p.o. midnight.  Kayla Part MD [facility] Phone Number: [phone] 09/06/2023 5:07 PM

## 2023-09-06 NOTE — Progress Notes (Addendum)
 [facility] NEPHROLOGY PROGRESS NOTE  Assessment/ Plan: Pt is a 78 y.o. yo male with CAD status post CABG, underwent laparoscopic appendectomy on 5/7 developed postop ileus, AKI on CKD.  # Acute kidney injury on CKD stage IV likely combination of reduced renal perfusion/ATN due to post op hypotension and use of IV contrast.  UA with pyuria and kidney ultrasound without hydronephrosis. I do not see that patient is getting IV fluid this morning, will resume fluid as he has poor oral intake.  Pending labs from this morning.  Patient understands that he might need dialysis soon if no improvement in renal function.  It was discussed with family yesterday by Dr. Zana Hesselbach as well. Follow lab, strict ins and out  # Appendicitis with possible abscess and postop ileus: On antibiotics and drain per surgical team.  # Acute blood loss anemia, CT angio negative for bleeding.  Transfuse as needed.  # Metabolic acidosis: Will start sodium bicarb with fluid.  Follow lab.  Addendum: Labs reviewed.  He has worsening BUN and creatinine level.  AV fistula is quite deep therefore vascular surgeon was consulted.  Plan for fistulogram and he will likely need tunneled HD catheter in order to start dialysis.  I have contacted vascular surgeon.  Subjective: Seen and examined, urine output recorded around 500 cc.  Patient is alert awake and understands that he might be needing dialysis soon.  He reports some nausea this morning.  Currently NPO. Objective Vital signs in last 24 hours: Vitals:   09/06/23 0100 09/06/23 0427 09/06/23 0500 09/06/23 0817  BP: (!) 151/89 130/78  127/78  Pulse: (!) 118 (!) 115  (!) 109  Resp:  18  19  Temp: 98.8 F (37.1 C) 99 F (37.2 C)  98.5 F (36.9 C)  TempSrc: Oral Oral  Oral  SpO2: 98% 97%  98%  Weight:   96.3 kg   Height:       Weight change:   Intake/Output Summary (Last 24 hours) at 09/06/2023 0845 Last data filed at 09/06/2023 1610 Gross per 24 hour   Intake 1082.87 ml  Output 630 ml  Net 452.87 ml       Labs: RENAL PANEL Recent Labs  Lab 08/30/23 1727 08/31/23 0416 09/01/23 0617 09/02/23 0112 09/03/23 0713 09/04/23 0723 09/05/23 0810 09/06/23 0652  NA 138 141 138 135 134* 134* 135  --   K 3.4* 3.0* 4.0 3.5 3.1* 3.6 4.1  --   CL 99 98 99 97* 97* 98 100  --   CO2 24 25 19* 22 21* 20* 16*  --   GLUCOSE 74 79 224* 162* 181* 88 73  --   BUN 86* 90* 105* 115* 109* 114* 115*  --   CREATININE 3.55* 3.88* 4.70* 5.33* 5.77* 6.63* 7.31*  --   CALCIUM  9.5 9.3 8.6* 8.2* 7.7* 7.5* 7.9*  --   MG  --   --   --   --   --  2.4 2.5* 2.7*  PHOS  --   --   --   --   --   --  9.4*  --   ALBUMIN  3.2* 2.9* 2.7*  --   --   --  2.1*  --     Liver Function Tests: Recent Labs  Lab 08/30/23 1727 08/31/23 0416 09/01/23 0617 09/05/23 0810  AST 24 21 17   --   ALT 22 19 17   --   ALKPHOS 66 67 55  --   BILITOT 0.7  0.9 1.0  --   PROT 7.5 7.1 6.7  --   ALBUMIN  3.2* 2.9* 2.7* 2.1*   Recent Labs  Lab 08/30/23 1727  LIPASE 28   No results for input(s): "AMMONIA" in the last 168 hours. CBC: Recent Labs    01/18/23 1118 04/06/23 0910 09/03/23 1720 09/04/23 0723 09/04/23 1646 09/05/23 1008 09/06/23 0652  HGB  --    < > 10.1* 9.7* 11.2* 10.4* 11.4*  MCV  --    < >  --  96.3  --   --  98.9  VITAMINB12 565  --   --   --   --   --   --    < > = values in this interval not displayed.    Cardiac Enzymes: No results for input(s): "CKTOTAL", "CKMB", "CKMBINDEX", "TROPONINI" in the last 168 hours. CBG: Recent Labs  Lab 09/05/23 1156 09/05/23 1641 09/05/23 2110 09/05/23 2343 09/06/23 0426  GLUCAP 91 109* 118* 156* 120*    Iron Studies: No results for input(s): "IRON", "TIBC", "TRANSFERRIN", "FERRITIN" in the last 72 hours. Studies/Results: DG Abd Portable 1V Result Date: 09/05/2023 CLINICAL DATA:  Abdominal pain EXAM: PORTABLE ABDOMEN - 1 VIEW COMPARISON:  09/03/2023 FINDINGS: Gas distended small bowel throughout the abdomen.  Osseous structures are unremarkable. Little interval change. IMPRESSION: Gas distended small bowel throughout the abdomen, unchanged. Electronically Signed   By: Juanetta Nordmann M.D.   On: 09/05/2023 12:36   US  RENAL Result Date: 09/05/2023 CLINICAL DATA:  Acute renal failure EXAM: RENAL / URINARY TRACT ULTRASOUND COMPLETE COMPARISON:  Abdominal CT from 3 days ago FINDINGS: Right Kidney: Renal measurements: 8.5 x 5.7 x 6.1 cm = volume: 150 mL. No hydronephrosis or solid mass. Mild increased cortical echogenicity diffusely. 16 mm cyst. Left Kidney: Renal measurements: 9 x 6 x 6 cm = volume: 170 mL. No hydronephrosis or solid mass. Bladder: Nonvisualized in the setting of Foley catheterization. IMPRESSION: No hydronephrosis or other significant change since CT 2 days ago. Electronically Signed   By: Ronnette Coke M.D.   On: 09/05/2023 09:22    Medications: Infusions:  piperacillin -tazobactam (ZOSYN )  IV 2.25 g (09/06/23 0550)    Scheduled Medications:  acetaminophen   1,000 mg Oral Q6H   feeding supplement  1 Container Oral TID BM   fluticasone  furoate-vilanterol  1 puff Inhalation Daily   methocarbamol   1,000 mg Oral Q8H    have reviewed scheduled and prn medications.  Physical Exam: General:NAD, comfortable Heart:RRR, s1s2 nl Lungs:clear b/l, no crackle Abdomen:soft, drain present Extremities:No edema Neurology: Alert, awake and following commands Vascular Access: AV fistula is quite deep, thrill positive.  Christian [name] 09/06/2023,8:45 AM  LOS: 6 days

## 2023-09-06 NOTE — TOC Progression Note (Signed)
 Transition of Care [facility]) - Progression Note    Patient Details  Name: Christian [name] MRN: [id number] Date of Birth: 1946/03/08  [facility] of [facility]) CM/SW Contact  Katrinka Parr, [place] Phone Number: 09/06/2023, 3:52 PM  Clinical Narrative:     CSW met with pt and pt's spouse. Provided SNF bed offers. They will review list and make SNF choice. CSW answered questions about POA paperwork. CSW explained that Chaplain at the hospital assists with [facility] specific HCPOA paperwork. They have outside paperwork and opt to complete it outside of the hospital. Pt currently has [employer]  in. TOC will continue to follow.   Expected Discharge Plan: Skilled Nursing Facility Barriers to Discharge: Continued Medical Work up                                             Social Determinants of Health (SDOH) Interventions SDOH Screenings   Food Insecurity: No Food Insecurity (08/31/2023)  Housing: Low Risk  (08/31/2023)  Transportation Needs: No Transportation Needs (08/31/2023)  Utilities: Not At Risk (08/31/2023)  Alcohol Screen: Low Risk  (07/23/2021)  Depression (PHQ2-9): Low Risk  (01/18/2023)  Financial Resource Strain: Low Risk  (07/23/2021)  Physical Activity: Inactive (07/23/2021)  Social Connections: Moderately Integrated (08/31/2023)  Stress: No Stress Concern Present (07/23/2021)  Tobacco Use: Medium Risk (08/31/2023)    Readmission Risk Interventions     No data to display

## 2023-09-06 NOTE — Progress Notes (Addendum)
 6 Days Post-Op   Subjective/Chief Complaint: Ab tight, no flatus/bm   Objective: Vital signs in last 24 hours: Temp:  [97.8 F (36.6 C)-99 F (37.2 C)] 98.5 F (36.9 C) (05/12 0817) Pulse Rate:  [100-120] 109 (05/12 0817) Resp:  [16-19] 19 (05/12 0817) BP: (127-161)/(78-89) 127/78 (05/12 0817) SpO2:  [97 %-100 %] 98 % (05/12 0817) Weight:  [96.3 kg] 96.3 kg (05/12 0500) Last BM Date : 09/04/23  Intake/Output from previous day: 05/11 0701 - 05/12 0700 In: 962.9 [P.O.:120; I.V.:742.9; IV Piggyback:100] Out: 630 [Urine:500; Drains:130] Intake/Output this shift: Total I/O In: 120 [Other:120] Out: -   Ab distended approp tender jp with dark old blood incisions clean  Lab Results:  Recent Labs    09/04/23 0723 09/04/23 1646 09/05/23 1008 09/06/23 0652  WBC 12.4*  --   --  14.9*  HGB 9.7*   < > 10.4* 11.4*  HCT 28.5*   < > 31.5* 34.6*  PLT 205  --   --  324   < > = values in this interval not displayed.   BMET Recent Labs    09/04/23 0723 09/05/23 0810  NA 134* 135  K 3.6 4.1  CL 98 100  CO2 20* 16*  GLUCOSE 88 73  BUN 114* 115*  CREATININE 6.63* 7.31*  CALCIUM  7.5* 7.9*   PT/INR No results for input(s): "LABPROT", "INR" in the last 72 hours. ABG No results for input(s): "PHART", "HCO3" in the last 72 hours.  Invalid input(s): "PCO2", "PO2"  Studies/Results: DG Abd Portable 1V Result Date: 09/05/2023 CLINICAL DATA:  Abdominal pain EXAM: PORTABLE ABDOMEN - 1 VIEW COMPARISON:  09/03/2023 FINDINGS: Gas distended small bowel throughout the abdomen. Osseous structures are unremarkable. Little interval change. IMPRESSION: Gas distended small bowel throughout the abdomen, unchanged. Electronically Signed   By: Juanetta Nordmann M.D.   On: 09/05/2023 12:36   US  RENAL Result Date: 09/05/2023 CLINICAL DATA:  Acute renal failure EXAM: RENAL / URINARY TRACT ULTRASOUND COMPLETE COMPARISON:  Abdominal CT from 3 days ago FINDINGS: Right Kidney: Renal measurements: 8.5 x 5.7  x 6.1 cm = volume: 150 mL. No hydronephrosis or solid mass. Mild increased cortical echogenicity diffusely. 16 mm cyst. Left Kidney: Renal measurements: 9 x 6 x 6 cm = volume: 170 mL. No hydronephrosis or solid mass. Bladder: Nonvisualized in the setting of Foley catheterization. IMPRESSION: No hydronephrosis or other significant change since CT 2 days ago. Electronically Signed   By: Ronnette Coke M.D.   On: 09/05/2023 09:22    Anti-infectives: Anti-infectives (From admission, onward)    Start     Dose/Rate Route Frequency Ordered Stop   08/31/23 2200  piperacillin -tazobactam (ZOSYN ) IVPB 2.25 g        2.25 g 100 mL/hr over 30 Minutes Intravenous Every 8 hours 08/31/23 1953     08/31/23 0600  piperacillin -tazobactam (ZOSYN ) IVPB 3.375 g  Status:  Discontinued        3.375 g 100 mL/hr over 30 Minutes Intravenous Every 8 hours 08/31/23 0532 08/31/23 1953   08/30/23 2245  piperacillin -tazobactam (ZOSYN ) IVPB 3.375 g        3.375 g 100 mL/hr over 30 Minutes Intravenous  Once 08/30/23 2233 08/31/23 0005       Assessment/Plan: POD 6 - s/p lap appy with JP drain placement for perforated appendicitis [name] 5/6  - persistent ileus, needs ng tube, will check another film today - dark sanguinous output from JP drain, Hgb 11.4 today, stable - continue IV Zosyn   due to perforation - encourage OOB, ambulation -will need PICC/TPN tomorrow if not better which is unlikely  FEN - NPO VTE - heparin /plavix  on hold ID - Zosyn   Christian [name] 09/06/2023

## 2023-09-07 ENCOUNTER — Inpatient Hospital Stay (HOSPITAL_COMMUNITY)

## 2023-09-07 ENCOUNTER — Encounter (HOSPITAL_COMMUNITY)
Admission: EM | Disposition: A | Discharge status: Skilled Nursing Facility | Payer: Self-pay | Source: Home / Self Care | Attending: Internal Medicine

## 2023-09-07 ENCOUNTER — Other Ambulatory Visit (HOSPITAL_COMMUNITY): Payer: Self-pay

## 2023-09-07 ENCOUNTER — Other Ambulatory Visit: Payer: Self-pay

## 2023-09-07 ENCOUNTER — Telehealth: Payer: Self-pay

## 2023-09-07 ENCOUNTER — Encounter (HOSPITAL_COMMUNITY): Payer: Self-pay | Admitting: Surgery

## 2023-09-07 DIAGNOSIS — N179 Acute kidney failure, unspecified: Secondary | ICD-10-CM | POA: Diagnosis not present

## 2023-09-07 DIAGNOSIS — K37 Unspecified appendicitis: Secondary | ICD-10-CM | POA: Diagnosis not present

## 2023-09-07 DIAGNOSIS — D62 Acute posthemorrhagic anemia: Secondary | ICD-10-CM | POA: Diagnosis not present

## 2023-09-07 DIAGNOSIS — T82898A Other specified complication of vascular prosthetic devices, implants and grafts, initial encounter: Secondary | ICD-10-CM | POA: Diagnosis not present

## 2023-09-07 DIAGNOSIS — N185 Chronic kidney disease, stage 5: Secondary | ICD-10-CM | POA: Diagnosis not present

## 2023-09-07 DIAGNOSIS — M79602 Pain in left arm: Secondary | ICD-10-CM

## 2023-09-07 DIAGNOSIS — N189 Chronic kidney disease, unspecified: Secondary | ICD-10-CM

## 2023-09-07 DIAGNOSIS — N184 Chronic kidney disease, stage 4 (severe): Secondary | ICD-10-CM | POA: Diagnosis not present

## 2023-09-07 HISTORY — PX: TUNNELLED CATHETER EXCHANGE: CATH118373

## 2023-09-07 HISTORY — PX: A/V FISTULAGRAM: CATH118298

## 2023-09-07 LAB — RENAL FUNCTION PANEL
Albumin: 1.7 g/dL — ABNORMAL LOW (ref 3.5–5.0)
Anion gap: 20 — ABNORMAL HIGH (ref 5–15)
BUN: 117 mg/dL — ABNORMAL HIGH (ref 8–23)
CO2: 21 mmol/L — ABNORMAL LOW (ref 22–32)
Calcium: 8.1 mg/dL — ABNORMAL LOW (ref 8.9–10.3)
Chloride: 95 mmol/L — ABNORMAL LOW (ref 98–111)
Creatinine, Ser: 8.66 mg/dL — ABNORMAL HIGH (ref 0.61–1.24)
GFR, Estimated: 6 mL/min — ABNORMAL LOW (ref >60)
Glucose, Bld: 72 mg/dL (ref 70–99)
Phosphorus: 10.4 mg/dL — ABNORMAL HIGH (ref 2.5–4.6)
Potassium: 3.8 mmol/L (ref 3.5–5.1)
Sodium: 136 mmol/L (ref 135–145)

## 2023-09-07 LAB — CBC WITH DIFFERENTIAL/PLATELET
Abs Immature Granulocytes: 0.49 10*3/uL — ABNORMAL HIGH (ref 0.00–0.07)
Basophils Absolute: 0.1 10*3/uL (ref 0.0–0.1)
Basophils Relative: 0 %
Eosinophils Absolute: 0.1 10*3/uL (ref 0.0–0.5)
Eosinophils Relative: 1 %
HCT: 29.2 % — ABNORMAL LOW (ref 39.0–52.0)
Hemoglobin: 10 g/dL — ABNORMAL LOW (ref 13.0–17.0)
Immature Granulocytes: 4 %
Lymphocytes Relative: 4 %
Lymphs Abs: 0.5 10*3/uL — ABNORMAL LOW (ref 0.7–4.0)
MCH: 32.7 pg (ref 26.0–34.0)
MCHC: 34.2 g/dL (ref 30.0–36.0)
MCV: 95.4 fL (ref 80.0–100.0)
Monocytes Absolute: 0.7 10*3/uL (ref 0.1–1.0)
Monocytes Relative: 5 %
Neutro Abs: 11.5 10*3/uL — ABNORMAL HIGH (ref 1.7–7.7)
Neutrophils Relative %: 86 %
Platelets: 299 10*3/uL (ref 150–400)
RBC: 3.06 MIL/uL — ABNORMAL LOW (ref 4.22–5.81)
RDW: 16.5 % — ABNORMAL HIGH (ref 11.5–15.5)
WBC: 13.4 10*3/uL — ABNORMAL HIGH (ref 4.0–10.5)
nRBC: 0 % (ref 0.0–0.2)

## 2023-09-07 LAB — CULTURE, BLOOD (ROUTINE X 2)
Culture: NO GROWTH
Culture: NO GROWTH
Special Requests: ADEQUATE
Special Requests: ADEQUATE

## 2023-09-07 LAB — GLUCOSE, CAPILLARY
Glucose-Capillary: 70 mg/dL (ref 70–99)
Glucose-Capillary: 78 mg/dL (ref 70–99)
Glucose-Capillary: 69 mg/dL — ABNORMAL LOW (ref 70–99)
Glucose-Capillary: 66 mg/dL — ABNORMAL LOW (ref 70–99)
Glucose-Capillary: 85 mg/dL (ref 70–99)
Glucose-Capillary: 71 mg/dL (ref 70–99)
Glucose-Capillary: 81 mg/dL (ref 70–99)

## 2023-09-07 LAB — POCT ACTIVATED CLOTTING TIME
Activated Clotting Time: 228 s
Activated Clotting Time: 187 s

## 2023-09-07 LAB — MAGNESIUM: Magnesium: 2.4 mg/dL (ref 1.7–2.4)

## 2023-09-07 MED ORDER — SEVELAMER CARBONATE 0.8 G PO PACK
0.8000 g | PACK | Freq: Three times a day (TID) | ORAL | Status: DC
  Filled 2023-09-07 (×20): qty 1

## 2023-09-07 MED ORDER — SODIUM CHLORIDE 0.9 % IV SOLN: 250.0000 mL | INTRAVENOUS | Status: AC | PRN

## 2023-09-07 MED ORDER — MORPHINE SULFATE (PF) 2 MG/ML IV SOLN
INTRAVENOUS | Status: AC
  Filled 2023-09-07: qty 1

## 2023-09-07 MED ORDER — LIDOCAINE HCL (PF) 1 % IJ SOLN
INTRAMUSCULAR | Status: DC | PRN
  Administered 2023-09-07 (×2): 5 mL via INTRADERMAL

## 2023-09-07 MED ORDER — MIDAZOLAM HCL 2 MG/2ML IJ SOLN
INTRAMUSCULAR | Status: DC | PRN
  Administered 2023-09-07: 1 mg via INTRAVENOUS

## 2023-09-07 MED ORDER — MIDAZOLAM HCL 2 MG/2ML IJ SOLN
INTRAMUSCULAR | Status: AC
  Filled 2023-09-07: qty 2

## 2023-09-07 MED ORDER — SODIUM CHLORIDE 0.9% FLUSH
3.0000 mL | INTRAVENOUS | Status: DC | PRN
  Administered 2023-09-30: 3 mL via INTRAVENOUS

## 2023-09-07 MED ORDER — METOPROLOL TARTRATE 5 MG/5ML IV SOLN
5.0000 mg | Freq: Once | INTRAVENOUS | Status: AC | PRN
  Administered 2023-09-07: 5 mg via INTRAVENOUS
  Filled 2023-09-07: qty 5

## 2023-09-07 MED ORDER — HEPARIN SODIUM (PORCINE) 1000 UNIT/ML IJ SOLN
INTRAMUSCULAR | Status: DC | PRN
  Administered 2023-09-07: 3000 [IU] via INTRAVENOUS

## 2023-09-07 MED ORDER — FENTANYL CITRATE (PF) 100 MCG/2ML IJ SOLN
INTRAMUSCULAR | Status: DC | PRN
  Administered 2023-09-07: 25 ug via INTRAVENOUS

## 2023-09-07 MED ORDER — HYDRALAZINE HCL 20 MG/ML IJ SOLN: 5.0000 mg | INTRAMUSCULAR | Status: DC | PRN

## 2023-09-07 MED ORDER — FENTANYL CITRATE (PF) 100 MCG/2ML IJ SOLN
INTRAMUSCULAR | Status: AC
  Filled 2023-09-07: qty 2

## 2023-09-07 MED ORDER — SODIUM CHLORIDE 0.9% FLUSH
3.0000 mL | Freq: Two times a day (BID) | INTRAVENOUS | Status: DC
  Administered 2023-09-07 – 2023-10-01 (×42): 3 mL via INTRAVENOUS

## 2023-09-07 MED ORDER — LIDOCAINE HCL (PF) 1 % IJ SOLN
INTRAMUSCULAR | Status: AC
  Filled 2023-09-07: qty 30

## 2023-09-07 MED ORDER — HEPARIN SODIUM (PORCINE) 1000 UNIT/ML IJ SOLN
INTRAMUSCULAR | Status: AC
  Filled 2023-09-07: qty 10

## 2023-09-07 NOTE — Progress Notes (Signed)
 Bedside RN, Rob, and patient given instruction to take it easy on lue x 24hours, armboard removed and pillow x2 under lue, no pushing, pulling, tugging, or hand stands, verbal acknowledgement noted

## 2023-09-07 NOTE — Interval H&P Note (Signed)
 History and Physical Interval Note:  09/07/2023 12:11 PM  Christian [name]  has presented today for surgery, with the diagnosis of instage renal.  The various methods of treatment have been discussed with the patient and family. After consideration of risks, benefits and other options for treatment, the patient has consented to  Procedure(s): A/V Fistulagram (N/A) TUNNELLED CATHETER EXCHANGE (N/A) as a surgical intervention.  The patient's history has been reviewed, patient examined, no change in status, stable for surgery.  I have reviewed the patient's chart and labs.  Questions were answered to the patient's satisfaction.     Gareld June

## 2023-09-07 NOTE — Progress Notes (Signed)
 Bilateral upper extremity vein mapping and left upper extremity arterial duplex has been completed. Preliminary results can be found in CV Proc through chart review.   09/07/23 11:55 AM Birda Buffy RVT

## 2023-09-07 NOTE — Progress Notes (Signed)
 Requested to see pt for out-pt HD needs at d/c. Pt is for snf placement and currently has a NG tube. Will submit referral for out-pt HD once snf placement is confirmed so proper clinic placement can be completed. Will also await removal of NG tube. Clinics are unable to hold appts for an extended period of time. Will assist as needed.   Lauraine Polite Renal Navigator [phone]

## 2023-09-07 NOTE — Progress Notes (Signed)
 Explained to patient that hypoglycemia is defined in pediatric population as blood glucose less than 70 mg/dL. Causes of hypoglycemia can be too much insulin , vomiting and diarrhea and with given NGT removing fluids and NPO status hypoglycemia could be a result.  Signs/symptoms of hypoglycemia are feeling sweaty, shaky, dizzy. Provided patient with expectation that hypoglycemia management is common and treatable with close monitoring. Stressed the importance of treating hypoglycemia with a IV dextrose  given patients NPO status.    Hypoglycemia trend continues throughout the day- 71 at 2019 will recheck in 1 hour to see if CBG is less than 70. Since patient is NPO. Attending provider contacted prior to shift change to request if IV dextrose  should be given as continuous fluid. MD wants PRN dose D 50% as needed throughout the night.

## 2023-09-07 NOTE — Congregational Nurse Program (Signed)
 RR nurse called and MD notified regarding sustained HR in the 130's tachypneic, increased work of breathing and large amount of drainage from JP site and not in gravity bulb. Pt is limited historian but does have generalized complaints of abdominal pain.   Pt is POD 7 from Lap appendectomy with perforation. Pt hospitalization has been complicated by hypotension, acute blood loss anemia, post op ileus, and the need for HD with rising lab values. Pt underwent fistulogram 5/13 that was complicated by increased clotting time and post poned HD until tomorrow. Pt also has had brief episodes of hypoglycemia given NPO status and NGT placement. Limited access of IV/ central line to start TPN and patient will have IR place a tunneled CVL to start TPN tomorrow.   PT will be transferred to 3W02. Pheobe to be the nurse getting the patient attempted to call and RN will return call for report once she reviews the chart.

## 2023-09-07 NOTE — Progress Notes (Signed)
      Patient NPO with plans for fistulogram and TDC placement With DR. Brabham in [facility]   Patient agrees to proceed He is NPO    Rocky Cipro PA-C

## 2023-09-07 NOTE — Progress Notes (Signed)
 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  Imaging confirmed acute appendicitis.  General surgery was consulted and cardiology was consulted to assist with preoperative cardiac clearance.   He underwent laparoscopic appendectomy on 08/31/2023.  Appendicitis with possible periappendiceal abscess Post op ileus - s/p lap appy 5/6 - continue zosyn  x 5 days post op (or as per surgery if further modification) - drain remains in place with bloody fluid noted - some drop in Hgb; likely multifactorial of hemodilution but also had plavix  prior to surgery so suspect some component of blood loss and stabilizing - CTA A/P obtained 5/8 and negative for bleeding and appears to be postop changes otherwise - see below for ABLA - Poor intake and no sign of ileus improving.  Surgery recommending TPN.  Cannot place PICC due to renal issues but we are working on central access; IR planning for tunneled CVL 5/14 - continue NGT for decompression  Acute on CKDIV ATN - patient has history of CKD4. Baseline creat ~ 3.2 - 3.5, eGFR~ 17 - he has required HD the past - possibly volume depletion from surgery/blood loss and when he had severe hypoTN on 5/7; I don't think due to zosyn  but AIN always a possibility as well  - seems to be ATN with uptrending creat/BUN; has been slowly approaching need for dialysis - Nephrology has been following also - Vascular surgery consulted; appreciate assistance.  Fistulogram performed 09/07/2023 and balloon venoplasty performed and left arm fistula ready for use.  Nephrology starting dialysis  ABLA -  As noted above, possibly component of hemodilution with some blood loss with recent surgery and postop with oozing into bulb -CT angio was negative for active bleeding - Hgb 10 g/dL this morning; some drop compared to yesterday.  No overt bleeding - Follow-up hemoglobin in a.m.  HTN - BP more stable now - hold home meds  DMII - Continue SSI and CBG monitoring  CAD - s/p CABG - cleared by cardiology prior to surgery  Interval History:  Feeling better some after NG tube decompression yesterday. Getting fistulogram today and IR planning on obtaining central access tomorrow.  Fistula ready for use for dialysis and central access still needed for TPN to start.   Old records reviewed in assessment of this patient  Antimicrobials: Zosyn  08/30/2023 >> current  DVT prophylaxis:  Place and maintain sequential compression device Start: 09/01/23 2001 SCDs Start: 08/31/23 0415   Code Status:   Code Status: Full Code  Mobility Assessment (Last 72 Hours)     Mobility Assessment     Row Name 09/07/23 0755 09/06/23 2334 09/06/23 1012 09/06/23 0900 09/05/23 2010   Does patient have an order for bedrest or is patient medically unstable Yes- Bedfast (Level 1) - Complete Yes- Bedfast (Level 1) - Complete -- No - Continue assessment No - Continue assessment   What is the highest level of mobility based on the progressive mobility assessment? Level 1 (Bedfast) - Unable to balance while sitting on edge of bed Level 1 (Bedfast) - Unable to balance while sitting on edge of bed Level 1 (Bedfast) - Unable to balance  while sitting on edge of bed Level 1 (Bedfast) - Unable to balance while sitting on edge of bed Level 3 (Stands with assist) - Balance while standing  and cannot march in place   Is the above level different from baseline mobility prior to current illness? Yes - Recommend PT order Yes - Recommend PT order -- Yes - Recommend PT order Yes - Recommend PT order    Row Name 09/05/23 0800 09/04/23  2111         Does patient have an order for bedrest or is patient medically unstable No - Continue assessment No - Continue assessment      What is the highest level of mobility based on the progressive mobility assessment? Level 1 (Bedfast) - Unable to balance while sitting on edge of bed Level 1 (Bedfast) - Unable to balance while sitting on edge of bed      Is the above level different from baseline mobility prior to current illness? Yes - Recommend PT order Yes - Recommend PT order               Barriers to discharge: None Disposition Plan: Home HH orders placed:  Status is: Inpatient  Objective: Blood pressure (!) 154/79, pulse (!) 115, temperature 97.9 F (36.6 C), temperature source Oral, resp. rate (!) 26, height 5\' 9"  (1.753 m), weight 97.5 kg, SpO2 92%.  Examination:  Physical Exam Constitutional:      General: He is not in acute distress.    Appearance: Normal appearance. He is obese.  HENT:     Head: Normocephalic and atraumatic.     Mouth/Throat:     Mouth: Mucous membranes are moist.  Eyes:     Extraocular Movements: Extraocular movements intact.  Cardiovascular:     Rate and Rhythm: Normal rate and regular rhythm.  Pulmonary:     Effort: Pulmonary effort is normal. No respiratory distress.     Breath sounds: Normal breath sounds. No wheezing.  Abdominal:     General: Abdomen is protuberant. There is no distension.     Palpations: Abdomen is soft.     Tenderness: There is abdominal tenderness.     Comments: Surgical drain in place with serosanguineous fluid noted.  Bowel sounds now hypoactive throughout  Musculoskeletal:        General: Normal range of motion.     Cervical back: Normal range of motion and neck supple.  Skin:    General: Skin is warm and dry.  Neurological:     General: No focal deficit present.     Mental Status: He is alert.  Psychiatric:        Mood and Affect: Mood normal.        Behavior: Behavior normal.      Consultants:   General surgery Nephrology Vascular surgery IR  Procedures:  08/31/2023: Laparoscopic appendectomy 09/07/2023: Fistulogram with balloon venoplasty  Data Reviewed: Results for orders placed or performed during the hospital encounter of 08/30/23 (from the past 24 hours)  Glucose, capillary     Status: None   Collection Time: 09/06/23  8:00 PM  Result Value Ref Range   Glucose-Capillary 85 70 - 99 mg/dL  Glucose, capillary     Status: None   Collection Time: 09/06/23 11:51 PM  Result Value Ref Range   Glucose-Capillary 90 70 - 99 mg/dL  Magnesium      Status: None   Collection Time: 09/07/23  3:23 AM  Result Value Ref Range   Magnesium  2.4 1.7 -  2.4 mg/dL  CBC with Differential/Platelet     Status: Abnormal   Collection Time: 09/07/23  3:23 AM  Result Value Ref Range   WBC 13.4 (H) 4.0 - 10.5 K/uL   RBC 3.06 (L) 4.22 - 5.81 MIL/uL   Hemoglobin 10.0 (L) 13.0 - 17.0 g/dL   HCT 16.1 (L) 09.6 - 04.5 %   MCV 95.4 80.0 - 100.0 fL   MCH 32.7 26.0 - 34.0 pg   MCHC 34.2 30.0 - 36.0 g/dL   RDW 40.9 (H) 81.1 - 91.4 %   Platelets 299 150 - 400 K/uL   nRBC 0.0 0.0 - 0.2 %   Neutrophils Relative % 86 %   Neutro Abs 11.5 (H) 1.7 - 7.7 K/uL   Lymphocytes Relative 4 %   Lymphs Abs 0.5 (L) 0.7 - 4.0 K/uL   Monocytes Relative 5 %   Monocytes Absolute 0.7 0.1 - 1.0 K/uL   Eosinophils Relative 1 %   Eosinophils Absolute 0.1 0.0 - 0.5 K/uL   Basophils Relative 0 %   Basophils Absolute 0.1 0.0 - 0.1 K/uL   Immature Granulocytes 4 %   Abs Immature Granulocytes 0.49 (H) 0.00 - 0.07 K/uL  Renal function panel     Status: Abnormal   Collection Time: 09/07/23  3:23 AM  Result Value Ref Range   Sodium 136 135 - 145 mmol/L   Potassium 3.8 3.5 - 5.1 mmol/L   Chloride 95 (L) 98 - 111 mmol/L   CO2 21 (L) 22 - 32 mmol/L   Glucose, Bld 72 70 - 99 mg/dL   BUN 782 (H) 8 - 23 mg/dL   Creatinine, Ser 9.56 (H) 0.61 - 1.24 mg/dL   Calcium  8.1 (L) 8.9 - 10.3 mg/dL   Phosphorus 21.3 (H) 2.5 - 4.6 mg/dL    Albumin  1.7 (L) 3.5 - 5.0 g/dL   GFR, Estimated 6 (L) >60 mL/min   Anion gap 20 (H) 5 - 15  Glucose, capillary     Status: None   Collection Time: 09/07/23  5:00 AM  Result Value Ref Range   Glucose-Capillary 70 70 - 99 mg/dL  Glucose, capillary     Status: None   Collection Time: 09/07/23  8:09 AM  Result Value Ref Range   Glucose-Capillary 78 70 - 99 mg/dL  Glucose, capillary     Status: Abnormal   Collection Time: 09/07/23 12:18 PM  Result Value Ref Range   Glucose-Capillary 69 (L) 70 - 99 mg/dL  POCT Activated clotting time     Status: None   Collection Time: 09/07/23  2:18 PM  Result Value Ref Range   Activated Clotting Time 228 seconds    I have reviewed pertinent nursing notes, vitals, labs, and images as necessary. I have ordered labwork to follow up on as indicated.  I have reviewed the last notes from staff over past 24 hours. I have discussed patient's care plan and test results with nursing staff, CM/SW, and other staff as appropriate.  Time spent: Greater than 50% of the 55 minute visit was spent in counseling/coordination of care for the patient as laid out in the A&P.   LOS: 7 days   Faith Homes, MD [facility] 09/07/2023, 4:15 PM

## 2023-09-07 NOTE — Plan of Care (Signed)

## 2023-09-07 NOTE — Progress Notes (Addendum)
 Pt A&O x3 intermittent confusion to time and hospital course history.   To be noted: q4 blood glucose monitoring downtrend through the night 4 am CBG/glucose 72. patient may require dextrose  prior to start of TPN.   JP drain LLQ removed dressing that was covering most of abdomen changed and upon assessment large hematoma/extensive brusing noted to LLQ of abdomen. MD notified as well as general surgery.   NGT was kinked behind patient at start of shift, nasal reinforcement replaced and tube adjusted. 550 output during night shift.   JP drain serosang output 200 out during the shift.   Foley care completed and urine output 500 during the shift.   No complaints of pain other than swallowing and irritation from NGT.   Abdomen distended. Hypoactive bowel sounds all quadrants.   Wheezing and rhonchi heard on lung auscultation. IS use encouraged. And cough and deep breathing education provided.

## 2023-09-07 NOTE — Plan of Care (Signed)
   Problem: Education: Goal: Ability to describe self-care measures that may prevent or decrease complications (Diabetes Survival Skills Education) will improve Outcome: Not Progressing Goal: Individualized Educational Video(s) Outcome: Not Progressing   Problem: Coping: Goal: Ability to adjust to condition or change in health will improve Outcome: Not Progressing

## 2023-09-07 NOTE — Progress Notes (Addendum)
 Christian [name]  Assessment/ Plan: Pt is a 78 y.o. yo male with CAD status post CABG, underwent laparoscopic appendectomy on 5/7 developed postop ileus, AKI on CKD.  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  UA with pyuria and kidney ultrasound without hydronephrosis. No sign of renal recovery with worsening azotemia. Plan for fistulogram and HD catheter placement by [facility]VS today. Dialysis today Will contact renal navigator to arrange outpatient HD  # Appendicitis with possible abscess and postop ileus: On antibiotics and drain per surgical team.  # Acute blood loss anemia, CT angio negative for bleeding.  Transfuse as needed.  # Metabolic acidosis: On sodium bicarbonate , starting dialysis soon.  # CKD-MBD, hyperphosphatemia: Start sevelamer , check PTH level.  Will consult dietitian to change tube feed to Nepro.  Subjective: Seen and examined, seen by vascular team and plan for the procedure today.  He agrees for dialysis.  No new event.  Objective Vital signs in last 24 hours: Vitals:   09/06/23 2350 09/07/23 0459 09/07/23 0500 09/07/23 0751  BP: (!) 149/83 138/84  (!) 156/86  Pulse: (!) 109 96  (!) 106  Resp: 18 18  18   Temp: 98 F (36.7 C) 98 F (36.7 C)  97.9 F (36.6 C)  TempSrc: Oral Oral  Oral  SpO2: 97% 97%  97%  Weight:   97.5 kg   Height:       Weight change: -0.077 kg  Intake/Output Summary (Last 24 hours) at 09/07/2023 0906 Last data filed at 09/07/2023 0755 Gross per 24 hour  Intake 1913.11 ml  Output 2015 ml  Net -101.89 ml       Labs: RENAL PANEL Recent Labs  Lab 09/01/23 0617 09/02/23 0112 09/03/23 0713 09/04/23 0723 09/05/23 0810 09/06/23 0652 09/07/23 0323  NA 138   < > 134* 134* 135 136 136  K 4.0   < > 3.1* 3.6 4.1 4.4 3.8  CL 99   < > 97* 98 100 99 95*  CO2 19*   < > 21*  20* 16* 16* 21*  GLUCOSE 224*   < > 181* 88 73 104* 72  BUN 105*   < > 109* 114* 115* 119* 117*  CREATININE 4.70*   < > 5.77* 6.63* 7.31* 8.15* 8.66*  CALCIUM  8.6*   < > 7.7* 7.5* 7.9* 8.3* 8.1*  MG  --   --   --  2.4 2.5* 2.7* 2.4  PHOS  --   --   --   --  9.4* 11.0* 10.4*  ALBUMIN  2.7*  --   --   --  2.1* 2.0* 1.7*   < > = values in this interval not displayed.    Liver Function Tests: Recent Labs  Lab 09/01/23 0617 09/05/23 0810 09/06/23 0652 09/07/23 0323  AST 17  --   --   --   ALT 17  --   --   --   ALKPHOS 55  --   --   --   BILITOT 1.0  --   --   --   PROT 6.7  --   --   --   ALBUMIN  2.7* 2.1* 2.0* 1.7*   No results for input(s): "LIPASE", "AMYLASE" in the last 168 hours.  No results for input(s): "AMMONIA" in the last 168 hours. CBC: Recent Labs    01/18/23  1118 04/06/23 0910 09/04/23 0723 09/04/23 1646 09/05/23 1008 09/06/23 0652 09/07/23 0323  HGB  --    < > 9.7* 11.2* 10.4* 11.4* 10.0*  MCV  --    < > 96.3  --   --  98.9 95.4  VITAMINB12 565  --   --   --   --   --   --    < > = values in this interval not displayed.    Cardiac Enzymes: No results for input(s): "CKTOTAL", "CKMB", "CKMBINDEX", "TROPONINI" in the last 168 hours. CBG: Recent Labs  Lab 09/06/23 1549 09/06/23 2000 09/06/23 2351 09/07/23 0500 09/07/23 0809  GLUCAP 105* 85 90 70 78    Iron Studies: No results for input(s): "IRON", "TIBC", "TRANSFERRIN", "FERRITIN" in the last 72 hours. Studies/Results: DG Abd Portable 1V Result Date: 09/06/2023 CLINICAL DATA:  NG tube placement. EXAM: PORTABLE ABDOMEN - 1 VIEW COMPARISON:  Radiograph earlier today FINDINGS: Tip and side port of the enteric tube below the diaphragm in the stomach. Diffuse gaseous distention of bowel loops in the abdomen, similar to earlier today. IMPRESSION: Tip and side port of the enteric tube below the diaphragm in the stomach. Electronically Signed   By: Chadwick Colonel M.D.   On: 09/06/2023 18:46   DG Abd  Portable 1V Result Date: 09/06/2023 CLINICAL DATA:  Ileus EXAM: PORTABLE ABDOMEN - 1 VIEW COMPARISON:  Sep 05, 2023 FINDINGS: Gas throughout the small bowel loops and colonic loops. With minimal dilatation of the central small bowel loops findings could correlate with some residual small bowel obstructive pattern versus nonspecific ileus. Findings although similar are slightly more prominent than on prior examinations and could correlate with worsening ileus. IMPRESSION: Gas throughout the small bowel loops and colonic loops. With minimal dilatation of the central small bowel loops findings could correlate with some residual small bowel obstructive pattern versus nonspecific ileus. Electronically Signed   By: Fredrich Jefferson M.D.   On: 09/06/2023 13:54   DG Abd Portable 1V Result Date: 09/05/2023 CLINICAL DATA:  Abdominal pain EXAM: PORTABLE ABDOMEN - 1 VIEW COMPARISON:  09/03/2023 FINDINGS: Gas distended small bowel throughout the abdomen. Osseous structures are unremarkable. Little interval change. IMPRESSION: Gas distended small bowel throughout the abdomen, unchanged. Electronically Signed   By: Juanetta Nordmann M.D.   On: 09/05/2023 12:36    Medications: Infusions:  acetaminophen  1,000 mg (09/07/23 0049)   piperacillin -tazobactam (ZOSYN )  IV 2.25 g (09/07/23 0553)   sodium bicarbonate  150 mEq in sterile water  1,150 mL infusion 100 mL/hr at 09/06/23 2315    Scheduled Medications:  Chlorhexidine  Gluconate Cloth  6 each Topical Daily   Chlorhexidine  Gluconate Cloth  6 each Topical Q0600   feeding supplement  1 Container Oral TID BM   fluticasone  furoate-vilanterol  1 puff Inhalation Daily    have reviewed scheduled and prn medications.  Physical Exam: General:NAD, comfortable Heart:RRR, s1s2 nl Lungs:clear b/l, no crackle Abdomen:soft, drain present Extremities:No edema Neurology: Alert, awake and following commands Vascular Access: AV fistula is quite deep, thrill positive.  Christian [name] 09/07/2023,9:06 AM  LOS: 7 days

## 2023-09-07 NOTE — Progress Notes (Signed)
 09/07/2023 5:22 PM  Spoke with Dr. Edson Graces regarding the patient being unavailable for dialysis today due to elevated activated clotting time, which has delayed sheath removal post fistulogram. Dr. Edson Graces verbalized that be patient will be dialyzed tomorrow morning.   New orders written.  Dialysis Nurse has been notified.   Gunner Iodice MSN, RN-BC, CNML Nephrology Director Phone: [phone]

## 2023-09-07 NOTE — Op Note (Signed)
    Patient name: Christian [name] MRN: [id number] DOB: 15-Mar-1946 Sex: male  09/07/2023 Pre-operative Diagnosis: End-stage renal disease Post-operative diagnosis:  Same Surgeon:  Gareld June Procedure Performed:  1.  Ultrasound-guided access, left basilic vein  2.  Ultrasound-guided access, left brachial artery  3.  Fistulogram  4.  Balloon venoplasty left basilic vein  5.  Conscious sedation, 60 minutes     Indications: This is a 78 year old gentleman with history of brachiobasilic fistula and subsequent intervention with stenting who is now getting ready to restart dialysis.  His fistula does not have a good thrill to it and so he comes in today for fistulogram  Procedure:  The patient was identified in the holding area and taken to room 8.  The patient was then placed supine on the table and prepped and draped in the usual sterile fashion.  A time out was called.  Conscious sedation was administered with the use of IV fentanyl  and Versed  under continuous physician and nurse monitoring.  Heart rate, blood pressure, and oxygen saturations were continuously monitored.  Total sedation time was 60 minutes ultrasound was used to evaluate the fistula.  The vein was patent and compressible.  A digital ultrasound image was acquired.  The fistula was then accessed under ultrasound guidance using a micropuncture needle.  An 018 wire was then asvanced without resistance and a micropuncture sheath was placed.  Contrast injections were then performed through the sheath.  Findings: No central venous stenosis.  The arteriovenous anastomosis is widely patent.  There is a stent just proximal to the arteriovenous anastomosis which is patent without significant stenosis.  There is however a turn in the vein which is associated with a greater than 70% stenosis.  This is in the mid upper arm   Intervention: After the above images were acquired the decision made to proceed with intervention.  I tried to get a  wire through the area of stenosis from the initial cannulation site in the upper arm however this was unsuccessful and so I cannulated the brachial artery under ultrasound guidance and placed a 5 French slender sheath.  I gave 3000 units of heparin  through the sheath.  I then advanced a Glidewire across the lesion and used a 660 Mustang balloon to perform balloon venoplasty.  Subsequent imaging revealed improved but suboptimal result and so I reinserted the balloon and repeated the balloon venoplasty at high atmospheric pressure for 1 minute.  Completion imaging showed inline flow with no residual stenosis greater than 20%.  The sheath in the basilic vein was removed and closed with a Monocryl.  The patient was taken to the holding area for brachial sheath removal  Impression:  #1  No evidence of central venous stenosis or anastomotic stenosis within fistula  #2  Greater than 70% mid fistula stenosis treated with a 6 millimeter balloon with residual stenosis less than 20%  #3  Fistula is optimized and ready for use  V. Gareld June, M.D., [facility] [place] Office: [phone] Pager:  [phone]

## 2023-09-07 NOTE — Telephone Encounter (Signed)
 Encounter opened in error.

## 2023-09-07 NOTE — Progress Notes (Signed)
 7 Days Post-Op   Subjective/Chief Complaint: Ab distended, no flatus/bm   Objective: Vital signs in last 24 hours: Temp:  [97.7 F (36.5 C)-99.2 F (37.3 C)] 97.9 F (36.6 C) (05/13 0751) Pulse Rate:  [96-117] 106 (05/13 0751) Resp:  [18] 18 (05/13 0751) BP: (131-156)/(83-89) 156/86 (05/13 0751) SpO2:  [96 %-98 %] 96 % (05/13 0952) Weight:  [97.5 kg] 97.5 kg (05/13 0500) Last BM Date : 09/04/23  Intake/Output from previous day: 05/12 0701 - 05/13 0700 In: 1833.1 [I.V.:1833.1] Out: 2135 [Urine:525; Emesis/NG output:1050; Drains:560] Intake/Output this shift: Total I/O In: 80 [Other:80] Out: -   Ab distended approp tender jp w/ bloody output (560 mL documented, up from 130)  Ng with bilious effluent  Lab Results:  Recent Labs    09/06/23 0652 09/07/23 0323  WBC 14.9* 13.4*  HGB 11.4* 10.0*  HCT 34.6* 29.2*  PLT 324 299   BMET Recent Labs    09/06/23 0652 09/07/23 0323  NA 136 136  K 4.4 3.8  CL 99 95*  CO2 16* 21*  GLUCOSE 104* 72  BUN 119* 117*  CREATININE 8.15* 8.66*  CALCIUM  8.3* 8.1*   PT/INR No results for input(s): "LABPROT", "INR" in the last 72 hours. ABG No results for input(s): "PHART", "HCO3" in the last 72 hours.  Invalid input(s): "PCO2", "PO2"  Studies/Results: DG Abd Portable 1V Result Date: 09/06/2023 CLINICAL DATA:  NG tube placement. EXAM: PORTABLE ABDOMEN - 1 VIEW COMPARISON:  Radiograph earlier today FINDINGS: Tip and side port of the enteric tube below the diaphragm in the stomach. Diffuse gaseous distention of bowel loops in the abdomen, similar to earlier today. IMPRESSION: Tip and side port of the enteric tube below the diaphragm in the stomach. Electronically Signed   By: Chadwick Colonel M.D.   On: 09/06/2023 18:46   DG Abd Portable 1V Result Date: 09/06/2023 CLINICAL DATA:  Ileus EXAM: PORTABLE ABDOMEN - 1 VIEW COMPARISON:  Sep 05, 2023 FINDINGS: Gas throughout the small bowel loops and colonic loops. With minimal dilatation  of the central small bowel loops findings could correlate with some residual small bowel obstructive pattern versus nonspecific ileus. Findings although similar are slightly more prominent than on prior examinations and could correlate with worsening ileus. IMPRESSION: Gas throughout the small bowel loops and colonic loops. With minimal dilatation of the central small bowel loops findings could correlate with some residual small bowel obstructive pattern versus nonspecific ileus. Electronically Signed   By: Fredrich Jefferson M.D.   On: 09/06/2023 13:54   DG Abd Portable 1V Result Date: 09/05/2023 CLINICAL DATA:  Abdominal pain EXAM: PORTABLE ABDOMEN - 1 VIEW COMPARISON:  09/03/2023 FINDINGS: Gas distended small bowel throughout the abdomen. Osseous structures are unremarkable. Little interval change. IMPRESSION: Gas distended small bowel throughout the abdomen, unchanged. Electronically Signed   By: Juanetta Nordmann M.D.   On: 09/05/2023 12:36    Anti-infectives: Anti-infectives (From admission, onward)    Start     Dose/Rate Route Frequency Ordered Stop   08/31/23 2200  piperacillin -tazobactam (ZOSYN ) IVPB 2.25 g        2.25 g 100 mL/hr over 30 Minutes Intravenous Every 8 hours 08/31/23 1953     08/31/23 0600  piperacillin -tazobactam (ZOSYN ) IVPB 3.375 g  Status:  Discontinued        3.375 g 100 mL/hr over 30 Minutes Intravenous Every 8 hours 08/31/23 0532 08/31/23 1953   08/30/23 2245  piperacillin -tazobactam (ZOSYN ) IVPB 3.375 g        3.375  g 100 mL/hr over 30 Minutes Intravenous  Once 08/30/23 2233 08/31/23 0005       Assessment/Plan: POD 7 - s/p lap appy with JP drain placement for perforated appendicitis [name] 5/6  - persistent ileus, needs ng tube, will check another film today - dark sanguinous output from JP drain, Hgb 10 today, re-check I nAM - continue IV Zosyn  due to perforation - encourage OOB, ambulation -needs TPN, defer access for TPN to primary team (likely not a  candidate for a PICC, may need to use central line temporarily)  FEN - NPO VTE - heparin /plavix  on hold ID - Zosyn   Charlott Converse 09/07/2023

## 2023-09-07 NOTE — Progress Notes (Signed)
 Left 5F brachial artery sheath was pulled per order. Manual pressure was held for 45 minutes. Patient tolerated pull well. Sheath site during and post sheath pull was clean, dry, intact and had no sign of a hematoma. Patients VS remained WNL's during and post sheath pull. Patient's bedrest began at 18:15. Vascular site assessment was a level 0. Sheath removal site was dressed with a gauze dressing and was clean, dry, and intact. Armboard was also placed to keep the left arm straight. Patient was educated about post sheath pull management and stated he understood and had no questions.

## 2023-09-07 NOTE — Progress Notes (Signed)
 OT Cancellation Note  Patient Details Name: Christian [name] MRN: [id number] DOB: 1945/05/03   Cancelled Treatment:    Reason Eval/Treat Not Completed: Other (comment) (per RN going for procedure soon, will follow up after as appropriate/schedule permits)  Lyllie Cobbins K, OTD, OTR/L [facility] [phone] - [phone]'  Deiondre Harrower K Koonce 09/07/2023, 12:15 PM

## 2023-09-08 ENCOUNTER — Inpatient Hospital Stay (HOSPITAL_COMMUNITY)

## 2023-09-08 ENCOUNTER — Other Ambulatory Visit (HOSPITAL_COMMUNITY): Payer: Self-pay

## 2023-09-08 DIAGNOSIS — D62 Acute posthemorrhagic anemia: Secondary | ICD-10-CM | POA: Diagnosis not present

## 2023-09-08 DIAGNOSIS — K37 Unspecified appendicitis: Secondary | ICD-10-CM | POA: Diagnosis not present

## 2023-09-08 DIAGNOSIS — I159 Secondary hypertension, unspecified: Secondary | ICD-10-CM | POA: Diagnosis not present

## 2023-09-08 DIAGNOSIS — N179 Acute kidney failure, unspecified: Secondary | ICD-10-CM | POA: Diagnosis not present

## 2023-09-08 HISTORY — PX: IR FLUORO GUIDE CV LINE RIGHT: IMG2283

## 2023-09-08 HISTORY — PX: IR US GUIDE VASC ACCESS RIGHT: IMG2390

## 2023-09-08 LAB — COMPREHENSIVE METABOLIC PANEL WITH GFR
ALT: 20 U/L (ref 0–44)
AST: 23 U/L (ref 15–41)
Albumin: 1.9 g/dL — ABNORMAL LOW (ref 3.5–5.0)
Alkaline Phosphatase: 62 U/L (ref 38–126)
Anion gap: 25 — ABNORMAL HIGH (ref 5–15)
BUN: 119 mg/dL — ABNORMAL HIGH (ref 8–23)
CO2: 19 mmol/L — ABNORMAL LOW (ref 22–32)
Calcium: 8.2 mg/dL — ABNORMAL LOW (ref 8.9–10.3)
Chloride: 96 mmol/L — ABNORMAL LOW (ref 98–111)
Creatinine, Ser: 8.87 mg/dL — ABNORMAL HIGH (ref 0.61–1.24)
GFR, Estimated: 6 mL/min — ABNORMAL LOW (ref >60)
Glucose, Bld: 82 mg/dL (ref 70–99)
Potassium: 4.1 mmol/L (ref 3.5–5.1)
Sodium: 140 mmol/L (ref 135–145)
Total Bilirubin: 2.1 mg/dL — ABNORMAL HIGH (ref 0.0–1.2)
Total Protein: 6.2 g/dL — ABNORMAL LOW (ref 6.5–8.1)

## 2023-09-08 LAB — CBC
HCT: 32.6 % — ABNORMAL LOW (ref 39.0–52.0)
Hemoglobin: 10.9 g/dL — ABNORMAL LOW (ref 13.0–17.0)
MCH: 32.3 pg (ref 26.0–34.0)
MCHC: 33.4 g/dL (ref 30.0–36.0)
MCV: 96.7 fL (ref 80.0–100.0)
Platelets: 406 10*3/uL — ABNORMAL HIGH (ref 150–400)
RBC: 3.37 MIL/uL — ABNORMAL LOW (ref 4.22–5.81)
RDW: 16.4 % — ABNORMAL HIGH (ref 11.5–15.5)
WBC: 16.8 10*3/uL — ABNORMAL HIGH (ref 4.0–10.5)
nRBC: 0 % (ref 0.0–0.2)

## 2023-09-08 LAB — LACTIC ACID, PLASMA: Lactic Acid, Venous: 1 mmol/L (ref 0.5–1.9)

## 2023-09-08 LAB — GLUCOSE, CAPILLARY
Glucose-Capillary: 83 mg/dL (ref 70–99)
Glucose-Capillary: 75 mg/dL (ref 70–99)
Glucose-Capillary: 94 mg/dL (ref 70–99)
Glucose-Capillary: 90 mg/dL (ref 70–99)
Glucose-Capillary: 92 mg/dL (ref 70–99)
Glucose-Capillary: 88 mg/dL (ref 70–99)

## 2023-09-08 LAB — MAGNESIUM: Magnesium: 2.6 mg/dL — ABNORMAL HIGH (ref 1.7–2.4)

## 2023-09-08 LAB — HEPATITIS B SURFACE ANTIBODY, QUANTITATIVE: Hep B S AB Quant (Post): 26.5 m[IU]/mL

## 2023-09-08 MED ORDER — IOHEXOL 350 MG/ML SOLN
75.0000 mL | Freq: Once | INTRAVENOUS | Status: AC | PRN
  Administered 2023-09-08: 75 mL via INTRAVENOUS

## 2023-09-08 MED ORDER — HEPARIN SODIUM (PORCINE) 1000 UNIT/ML IJ SOLN
INTRAMUSCULAR | Status: AC
  Filled 2023-09-08: qty 10

## 2023-09-08 MED ORDER — HEPARIN SODIUM (PORCINE) 1000 UNIT/ML DIALYSIS
1000.0000 [IU] | INTRAMUSCULAR | Status: DC | PRN
Start: 2023-09-08 — End: 2023-09-08

## 2023-09-08 MED ORDER — PENTAFLUOROPROP-TETRAFLUOROETH EX AERO: 1.0000 | INHALATION_SPRAY | CUTANEOUS | Status: DC | PRN

## 2023-09-08 MED ORDER — LIDOCAINE-EPINEPHRINE 1 %-1:100000 IJ SOLN
INTRAMUSCULAR | Status: AC
  Filled 2023-09-08: qty 1

## 2023-09-08 MED ORDER — LIDOCAINE-PRILOCAINE 2.5-2.5 % EX CREA
1.0000 | TOPICAL_CREAM | CUTANEOUS | Status: DC | PRN
Start: 2023-09-08 — End: 2023-09-08

## 2023-09-08 MED ORDER — LIDOCAINE HCL (PF) 1 % IJ SOLN
5.0000 mL | INTRAMUSCULAR | Status: DC | PRN
Start: 2023-09-08 — End: 2023-09-08

## 2023-09-08 MED ORDER — ALTEPLASE 2 MG IJ SOLR: 2.0000 mg | Freq: Once | INTRAMUSCULAR | Status: DC | PRN

## 2023-09-08 MED ORDER — HEPARIN SODIUM (PORCINE) 1000 UNIT/ML IJ SOLN
INTRAMUSCULAR | Status: AC
  Filled 2023-09-08: qty 3

## 2023-09-08 MED ORDER — NALOXONE HCL 0.4 MG/ML IJ SOLN
0.4000 mg | INTRAMUSCULAR | Status: DC | PRN
Start: 2023-09-08 — End: 2023-09-29

## 2023-09-08 MED ORDER — LIDOCAINE-PRILOCAINE 2.5-2.5 % EX CREA: 1.0000 | TOPICAL_CREAM | CUTANEOUS | Status: DC | PRN

## 2023-09-08 MED ORDER — LIDOCAINE HCL (PF) 1 % IJ SOLN: 5.0000 mL | INTRAMUSCULAR | Status: DC | PRN

## 2023-09-08 MED ORDER — ANTICOAGULANT SODIUM CITRATE 4% (200MG/5ML) IV SOLN: 5.0000 mL | Status: DC | PRN

## 2023-09-08 MED ORDER — INSULIN ASPART 100 UNIT/ML IJ SOLN
0.0000 [IU] | INTRAMUSCULAR | Status: DC
  Administered 2023-09-09: 2 [IU] via SUBCUTANEOUS
  Administered 2023-09-09: 3 [IU] via SUBCUTANEOUS
  Administered 2023-09-09: 2 [IU] via SUBCUTANEOUS
  Administered 2023-09-09: 3 [IU] via SUBCUTANEOUS
  Administered 2023-09-09: 2 [IU] via SUBCUTANEOUS
  Administered 2023-09-10 (×2): 3 [IU] via SUBCUTANEOUS
  Administered 2023-09-10 (×2): 2 [IU] via SUBCUTANEOUS
  Administered 2023-09-10: 3 [IU] via SUBCUTANEOUS
  Administered 2023-09-10: 2 [IU] via SUBCUTANEOUS
  Administered 2023-09-10 – 2023-09-11 (×3): 3 [IU] via SUBCUTANEOUS

## 2023-09-08 MED ORDER — HEPARIN SODIUM (PORCINE) 1000 UNIT/ML DIALYSIS
1000.0000 [IU] | INTRAMUSCULAR | Status: DC | PRN
  Administered 2023-09-08: 1000 [IU]

## 2023-09-08 MED ORDER — DEXTROSE 70 % IV SOLN
INTRAVENOUS | Status: AC
  Filled 2023-09-08: qty 386.4

## 2023-09-08 MED ORDER — CHLORHEXIDINE GLUCONATE CLOTH 2 % EX PADS
6.0000 | MEDICATED_PAD | Freq: Every day | CUTANEOUS | Status: DC
  Administered 2023-09-08 – 2023-09-09 (×2): 6 via TOPICAL

## 2023-09-08 MED ORDER — HYDROMORPHONE HCL 1 MG/ML IJ SOLN
0.5000 mg | INTRAMUSCULAR | Status: DC | PRN
  Administered 2023-09-08 – 2023-09-16 (×18): 0.5 mg via INTRAVENOUS
  Filled 2023-09-08 (×6): qty 0.5
  Filled 2023-09-08: qty 1
  Filled 2023-09-08 (×14): qty 0.5

## 2023-09-08 NOTE — Plan of Care (Signed)
  Problem: Education: Goal: Ability to describe self-care measures that may prevent or decrease complications (Diabetes Survival Skills Education) will improve Outcome: Progressing   Problem: Fluid Volume: Goal: Ability to maintain a balanced intake and output will improve Outcome: Progressing   Problem: Pain Managment: Goal: General experience of comfort will improve and/or be controlled Outcome: Progressing   Problem: Safety: Goal: Ability to remain free from injury will improve Outcome: Progressing

## 2023-09-08 NOTE — Procedures (Signed)
 Interventional Radiology Procedure Note  Procedure: Placement of a right IJ approach triple lumen temp HD cath.  Tip is positioned at the superior cavoatrial junction and catheter is ready for immediate use.   Complications: None  Recommendations:  - Ok to use - Do not submerge - Routine line care   Signed,  Marciano Settles. Mabel Savage, DO

## 2023-09-08 NOTE — Progress Notes (Addendum)
 [facility] [place]  Subjective  - He states he is not well over all and that he is worried his wife will not find him.   Objective 121/63 (!) 123 99.3 F (37.4 C) (Oral) 18 94%  Intake/Output Summary (Last 24 hours) at 09/08/2023 0742 Last data filed at 09/08/2023 0634 Gross per 24 hour  Intake 80 ml  Output 1510 ml  Net -1430 ml    Left UE with palpable thrill, palpable radial pulse. Motor and sensation intact left UE without symptoms of steal.    Assessment/Planning: POD # 1 fistulogram  Findings: No central venous stenosis.  The arteriovenous anastomosis is widely patent.  There is a stent just proximal to the arteriovenous anastomosis which is patent without significant stenosis.    S/P balloon venoplasty  Fistula is optimized and ready for use   F/U PRN  Rocky Cipro 09/08/2023 7:42 AM --  VASCULAR STAFF ADDENDUM: I agree with the above.  Please call if unable to cannulate    Kayla Part MD [facility] Phone Number: [phone] 09/08/2023 8:09 AM    Laboratory Lab Results: Recent Labs    09/07/23 0323 09/08/23 0024  WBC 13.4* 16.8*  HGB 10.0* 10.9*  HCT 29.2* 32.6*  PLT 299 406*   BMET Recent Labs    09/07/23 0323 09/08/23 0024  NA 136 140  K 3.8 4.1  CL 95* 96*  CO2 21* 19*  GLUCOSE 72 82  BUN 117* 119*  CREATININE 8.66* 8.87*  CALCIUM  8.1* 8.2*    COAG Lab Results  Component Value Date   INR 1.1 10/04/2019   INR 1.2 09/14/2019   INR 1.3 (H) 08/19/2019   No results found for: "PTT"

## 2023-09-08 NOTE — Progress Notes (Signed)
 Patient Status: [facility] - In-pt  Assessment and Plan: AVF malfunction Malnutrition of acute illness  Patient with chronic kidney disease on HD via LUE AVF admitted with acute appendicitis now POD 8 of lap appy with persistent ileus in need of parenteral nutrition. Also with poorly functioning AVF with persistent difficulty cannulating despite fistulagram/angioplasty yesterday with Vascular Surgery.  IR consulted for central venous access for both reliable HD as well as TPN.  IR staff has confirmed with IV therapy that a trialysis catheter is sufficient for both needs.  Discussed with wife, Christian [name], who is agreeable to temporary dialysis (trialysis) catheter.  She understands that additional intervention, likely with Vascular surgery, will be needed to secure his long-term access however given his current needs, elevated WBC, and clinical status a temporary line is best option.   Risks and benefits discussed with the patient including, but not limited to bleeding, infection, vascular injury, pneumothorax which may require chest tube placement, air embolism or even death  All of the patient's questions were answered, patient is agreeable to proceed. Consent signed and in chart.  ______________________________________________________________________   History of Present Illness: Christian [name] is a 78 y.o. male with CAD, CKD who underwent lap appy 5/7 now post op ileus.  IR consulted for central access for IV nutrition.   Unfortunately, patient has also had persistent difficulty in access his LUE AVF.  Access was evaluated yesterday by Vascular surgery who performed fistulagram with angioplasty, however access unable to cannulated this AM.  Patient will also need a temporary HD catheter.   Allergies and medications reviewed.   Review of Systems: A 12 point ROS discussed and pertinent positives are indicated in the HPI above.  All other systems are negative.  Review of Systems   Unable to perform ROS: Acuity of condition    Vital Signs: BP 134/68 (BP Location: Right Arm)   Pulse (!) 120   Temp 98.7 F (37.1 C) (Oral)   Resp 20   Ht 5\' 9"  (1.753 m)   Wt 208 lb 5.4 oz (94.5 kg)   SpO2 (!) 89%   BMI 30.77 kg/m   Physical Exam Vitals and nursing note reviewed.  Constitutional:      General: He is not in acute distress.    Appearance: He is ill-appearing.  Cardiovascular:     Rate and Rhythm: Normal rate.  Pulmonary:     Effort: Pulmonary effort is normal. No respiratory distress.  Skin:    General: Skin is warm and dry.  Neurological:     General: No focal deficit present.     Mental Status: He is alert and oriented to person, place, and time.  Psychiatric:        Mood and Affect: Mood normal.        Behavior: Behavior normal.      Imaging reviewed.   Labs:  COAGS: No results for input(s): "INR", "APTT" in the last 8760 hours.  BMP: Recent Labs    09/05/23 0810 09/06/23 0652 09/07/23 0323 09/08/23 0024  NA 135 136 136 140  K 4.1 4.4 3.8 4.1  CL 100 99 95* 96*  CO2 16* 16* 21* 19*  GLUCOSE 73 104* 72 82  BUN 115* 119* 117* 119*  CALCIUM  7.9* 8.3* 8.1* 8.2*  CREATININE 7.31* 8.15* 8.66* 8.87*  GFRNONAA 7* 6* 6* 6*       Electronically Signed: Lillian Rein, PA 09/08/2023, 11:49 AM   I spent a total of 15 minutes in  face to face in clinical consultation, greater than 50% of which was counseling/coordinating care for venous access.

## 2023-09-08 NOTE — Progress Notes (Signed)
 PT BROUGHT TO UNIT TO HAVE 1ST HD TX LUA AVF ATTEMPTED CANNULATION UNSUCCESSFUL AFTER MULTIPLE ATTEMPTS USING 17 G NEEDLES DUE TO DR Edson Graces AT BEDSIDE DURING PREP ALSO NOTIFIED CANNULATION UNSUCCESSFUL

## 2023-09-08 NOTE — Progress Notes (Signed)
 PT Cancellation Note  Patient Details Name: TAWAN TABET MRN: [id number] DOB: [birth year]   Cancelled Treatment:    Reason Eval/Treat Not Completed: Patient at procedure or test/unavailable (Pt in HD. Will follow up later if time allows.)   Jakyra Kenealy 09/08/2023, 8:37 AM

## 2023-09-08 NOTE — Progress Notes (Signed)
 PHARMACY - TOTAL PARENTERAL NUTRITION CONSULT NOTE   Indication: Prolonged ileus  Patient Measurements: Height: 5\' 9"  (175.3 cm) Weight: 94.5 kg (208 lb 5.4 oz) IBW/kg (Calculated) : 70.7 TPN AdjBW (KG): 78.4 Body mass index is 30.77 kg/m. Usual Weight: 206-215lb per chart review and wife  Assessment:  78 yo M with acute appendicitis s/p appendectomy with persistent ileus. 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 Patient with little to no intake since admission (9 days) due to procedures and bloody drain output. Patient may be a little confused right now per RN. Spoke with Wife who states patient eats a variety of foods (vegetable soup with beef, hamburgers, fries etc) 5-6 times a day at home. Last good meal was 5/3 prior to admission. Pharmacy consulted for TPN.   Glucose / Insulin : BG <100, A1C 7.3% Home med- glimepiride  Electrolytes: Cl 96, CO2 19, Mg 2.6, phos 10.4, others wnl Renal: New HD, BUN 119 Hepatic: Tbili 2.1, others wnl, albumin  1.9 Intake / Output; MIVF: UOP charted, NGT , drain , LBM 5/10 GI Imaging: 5/5 CT: acute appendicitis with possible small abscess  5/8 CT: drain in place, mild SB distension likely mild ileus  5/9 KUB: increased gas in SB and colon, likely adynamic ileus or early pSBO 5/11 KUB: Gas distended small bowel throughout the abdomen, unchanged.  5/12 KUB: Diffuse gaseous distention of bowel loops in the abdomen, SBO vs ileus  5/13 KUB:  Mild diffuse gaseous dilatation of large and small bowel without significant change 5/14 CT: pending  GI Surgeries / Procedures:  5/6 laparoscopic appendectomy with JP drain  Central access: CVC 5/14 TPN start date: 5/14   Nutritional Goals: Goal TPN rate is 70 mL/hr (provides 116 g of protein and 2192 kcals per day)  RD Assessment: Estimated Needs Total Energy Estimated Needs: 2150-2350 Total Protein Estimated Needs: 115-135 grams Total Fluid Estimated Needs: UOP + 1L  Current  Nutrition:  NPO  Plan:  Start TPN at 35 mL/hr at 1800, provides 58g protein and 1096 kcals, meeting ~50% of estimated needs Electrolytes in TPN: Na 75 mEq/L, K 0 mEq/L, Ca 0 mEq/L, Mg 0 mEq/L, and Phos 2 mmol/L. Cl:Ac max acetate  Add standard MVI and trace elements to TPN Initiate Sensitive q4h SSI and adjust as needed   Monitor TPN labs daily until stable at goal then on Mon/Thurs   Dorene Gang, PharmD, BCPS, [facility] Clinical Pharmacist  Please check AMION for all [facility] Pharm[facility] phone numbers After 10:00 PM, call Main Pharmacy [phone]

## 2023-09-08 NOTE — Progress Notes (Addendum)
 1 Day Post-Op   Subjective/Chief Complaint: Reports abdominal discomfort is stable. Reports flatus once yesterday, No BM.  He is trying to get in touch with his wife about his room change.   Noted rapid response note from overnight. S/P balloon venoplasty yesterday by vascular, fistula cleared for use   Objective: Vital signs in last 24 hours: Temp:  [97.9 F (36.6 C)-99.5 F (37.5 C)] 99.3 F (37.4 C) (05/14 0352) Pulse Rate:  [106-133] 123 (05/14 0352) Resp:  [13-30] 18 (05/14 0352) BP: (110-163)/(63-87) 121/63 (05/14 0352) SpO2:  [89 %-98 %] 94 % (05/14 0352) Weight:  [94.5 kg] 94.5 kg (05/14 0500) Last BM Date : 09/08/23  Intake/Output from previous day: 05/13 0701 - 05/14 0700 In: 80  Out: 1510 [Urine:650; Emesis/NG output:635; Drains:225] Intake/Output this shift: No intake/output data recorded.  Ab distended approp tender jp w/ bloody output (225 mL)  Ng with bilious effluent w/ solid sediment (635 mL)  Lab Results:  Recent Labs    09/07/23 0323 09/08/23 0024  WBC 13.4* 16.8*  HGB 10.0* 10.9*  HCT 29.2* 32.6*  PLT 299 406*   BMET Recent Labs    09/07/23 0323 09/08/23 0024  NA 136 140  K 3.8 4.1  CL 95* 96*  CO2 21* 19*  GLUCOSE 72 82  BUN 117* 119*  CREATININE 8.66* 8.87*  CALCIUM  8.1* 8.2*   PT/INR No results for input(s): "LABPROT", "INR" in the last 72 hours. ABG No results for input(s): "PHART", "HCO3" in the last 72 hours.  Invalid input(s): "PCO2", "PO2"  Studies/Results: DG CHEST PORT 1 VIEW Result Date: 09/07/2023 CLINICAL DATA:  Respiratory distress EXAM: PORTABLE CHEST 1 VIEW COMPARISON:  04/06/2023, CT chest 12/12/2020, chest x-ray 05/22/2021 FINDINGS: Sternotomy. Stable cardiomediastinal silhouettewhich appears slightly enlarged. There are chronic pleural and parenchymal changes on the left. There is new suspected right pleural effusion. Suspicion of vascular congestion and possible mild edema IMPRESSION: Chronic pleural and  parenchymal changes on the left. The heart is enlarged. Suspect mild central congestion and probable new right-sided pleural effusion Electronically Signed   By: Esmeralda Hedge M.D.   On: 09/07/2023 23:54   DG Abd Portable 1V Result Date: 09/07/2023 CLINICAL DATA:  Abdomen pain EXAM: PORTABLE ABDOMEN - 1 VIEW COMPARISON:  09/06/2023 FINDINGS: Enteric tube tip overlies the distal stomach. Mild diffuse gaseous dilatation of large and small bowel. IMPRESSION: Enteric tube tip overlies the distal stomach. Mild diffuse gaseous dilatation of large and small bowel without significant change Electronically Signed   By: Esmeralda Hedge M.D.   On: 09/07/2023 23:50   VAS US  UPPER EXTREMITY ARTERIAL DUPLEX Result Date: 09/07/2023  UPPER EXTREMITY DUPLEX STUDY Patient Name:  Christian [name]  Date of Exam:   09/07/2023 Medical Rec #: [id number]        Accession #:    [id number] Date of Birth: 07-25-1945       Patient Gender: M Patient Age:   46 years Exam Location:  [facility] Procedure:      VAS US  UPPER EXTREMITY ARTERIAL DUPLEX Referring Phys: EMMA COLLINS --------------------------------------------------------------------------------  Indications: Pain from vascular device, graft or implant.  Risk Factors:  Hypertension, hyperlipidemia, Diabetes. Other Factors: 09/18/2020 - ARTERIOVENOUS (AV) FISTULA CREATION LEFT VERSUS GRAFT Comparison Study: No prior studies. Performing Technologist: Lerry Ransom RVT  Examination Guidelines: A complete evaluation includes B-mode imaging, spectral Doppler, color Doppler, and power Doppler as needed of all accessible portions of each vessel. Bilateral testing is considered an integral part of a complete  examination. Limited examinations for reoccurring indications may be performed as noted.  Left Doppler Findings: +--------------+----------+---------+--------+------------------+ Site          PSV (cm/s)Waveform StenosisComments            +--------------+----------+---------+--------+------------------+ Subclavian Mid76        triphasic                           +--------------+----------+---------+--------+------------------+ Axillary      134       triphasic                           +--------------+----------+---------+--------+------------------+ Brachial Prox 85        triphasic                           +--------------+----------+---------+--------+------------------+ Brachial Dist 69        triphasic                           +--------------+----------+---------+--------+------------------+ Radial Prox                              Unable to insonate +--------------+----------+---------+--------+------------------+ Radial Mid                               Unable to insonate +--------------+----------+---------+--------+------------------+ Radial Dist                              Unable to insonate +--------------+----------+---------+--------+------------------+ Ulnar Prox    69        triphasic                           +--------------+----------+---------+--------+------------------+ Ulnar Mid     66        triphasic                           +--------------+----------+---------+--------+------------------+ Ulnar Dist    67        triphasic                           +--------------+----------+---------+--------+------------------+ Flow is detected in the left brachio-basilic fistula.  Summary:  Left: No obstruction visualized in the left upper extremity. *See table(s) above for measurements and observations. Electronically signed by Delaney Fearing on 09/07/2023 at 4:09:38 PM.    Final    VAS US  UPPER EXT VEIN MAPPING (PRE-OP AVF) Result Date: 09/07/2023 UPPER EXTREMITY VEIN MAPPING Patient Name:  Christian [name]  Date of Exam:   09/07/2023 Medical Rec #: [id number]        Accession #:    [id number] Date of Birth: 06/20/1945       Patient Gender: M Patient Age:   78 years Exam  Location:  [facility] Procedure:      VAS US  UPPER EXT VEIN MAPPING (PRE-OP AVF) Referring Phys: EMMA COLLINS --------------------------------------------------------------------------------  Indications: Pre-access. Comparison Study: No prior studies. Performing Technologist: Lerry Ransom RVT  Examination Guidelines: A complete evaluation includes B-mode imaging, spectral Doppler, color Doppler, and power Doppler as needed of all accessible portions of each vessel. Bilateral testing is  considered an integral part of a complete examination. Limited examinations for reoccurring indications may be performed as noted. +-----------------+-------------+----------+--------+ Right Cephalic   Diameter (cm)Depth (cm)Findings +-----------------+-------------+----------+--------+ Shoulder             0.06        0.34            +-----------------+-------------+----------+--------+ Prox upper arm       0.04        0.39            +-----------------+-------------+----------+--------+ Mid upper arm        0.04        0.46            +-----------------+-------------+----------+--------+ Dist upper arm       0.08        0.22            +-----------------+-------------+----------+--------+ Antecubital fossa    0.06        0.33            +-----------------+-------------+----------+--------+ Prox forearm         0.13        0.40            +-----------------+-------------+----------+--------+ Mid forearm          0.19        0.46            +-----------------+-------------+----------+--------+ Dist forearm         0.12        0.37            +-----------------+-------------+----------+--------+ +-----------------+-------------+----------+---------+ Right Basilic    Diameter (cm)Depth (cm)Findings  +-----------------+-------------+----------+---------+ Shoulder             0.46        1.10             +-----------------+-------------+----------+---------+ Prox upper  arm       0.34        1.10             +-----------------+-------------+----------+---------+ Mid upper arm        0.32        1.40             +-----------------+-------------+----------+---------+ Dist upper arm       0.33        0.73   branching +-----------------+-------------+----------+---------+ Antecubital fossa    0.22        0.62             +-----------------+-------------+----------+---------+ Prox forearm         0.21        0.40             +-----------------+-------------+----------+---------+ Mid forearm          0.19        0.38             +-----------------+-------------+----------+---------+ Distal forearm       0.19        0.29             +-----------------+-------------+----------+---------+ +-----------------+-------------+----------+--------+ Left Cephalic    Diameter (cm)Depth (cm)Findings +-----------------+-------------+----------+--------+ Shoulder             0.50        0.22            +-----------------+-------------+----------+--------+ Prox upper arm       0.05        0.31            +-----------------+-------------+----------+--------+  Mid upper arm        0.04        0.52            +-----------------+-------------+----------+--------+ Dist upper arm       0.05        0.46            +-----------------+-------------+----------+--------+ Antecubital fossa    0.06        0.41            +-----------------+-------------+----------+--------+ Prox forearm         0.12        0.47            +-----------------+-------------+----------+--------+ Mid forearm          0.00        0.44            +-----------------+-------------+----------+--------+ Dist forearm         0.06        0.36            +-----------------+-------------+----------+--------+ +-----------------+-------------+----------+---------+ Left Basilic     Diameter (cm)Depth (cm)Findings  +-----------------+-------------+----------+---------+  Shoulder             0.84        1.40             +-----------------+-------------+----------+---------+ Prox upper arm       0.38        0.41             +-----------------+-------------+----------+---------+ Mid upper arm                           HD Access +-----------------+-------------+----------+---------+ Dist upper arm                          HD Access +-----------------+-------------+----------+---------+ Antecubital fossa    0.14        0.45             +-----------------+-------------+----------+---------+ Prox forearm         0.05        0.24             +-----------------+-------------+----------+---------+ Mid forearm          0.05        0.22             +-----------------+-------------+----------+---------+ Distal forearm       0.05        0.32             +-----------------+-------------+----------+---------+ *See table(s) above for measurements and observations.  Diagnosing physician: Delaney Fearing Electronically signed by Delaney Fearing on 09/07/2023 at 4:09:10 PM.    Final    PERIPHERAL VASCULAR CATHETERIZATION Result Date: 09/07/2023 Images from the original result were not included. Patient name: MATTY OFTEDAHL MRN: [id number] DOB: 18-Jan-1946 Sex: male 09/07/2023 Pre-operative Diagnosis: End-stage renal disease Post-operative diagnosis:  Same Surgeon:  Gareld June Procedure Performed:  1.  Ultrasound-guided access, left basilic vein  2.  Ultrasound-guided access, left brachial artery  3.  Fistulogram  4.  Balloon venoplasty left basilic vein  5.  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  His fistula does not have a good thrill to it and so he comes in today for fistulogram Procedure:  The patient was identified  in the holding area and taken to room 8.  The patient was then placed supine on  the table and prepped and draped in the usual sterile fashion.  A time out was called.  Conscious sedation was administered with the use of IV fentanyl  and Versed  under continuous physician and nurse monitoring.  Heart rate, blood pressure, and oxygen saturations were continuously monitored.  Total sedation time was 60 minutes ultrasound was used to evaluate the fistula.  The vein was patent and compressible.  A digital ultrasound image was acquired.  The fistula was then accessed under ultrasound guidance using a micropuncture needle.  An 018 wire was then asvanced without resistance and a micropuncture sheath was placed.  Contrast injections were then performed through the sheath. Findings: No central venous stenosis.  The arteriovenous anastomosis is widely patent.  There is a stent just proximal to the arteriovenous anastomosis which is patent without significant stenosis.  There is however a turn in the vein which is associated with a greater than 70% stenosis.  This is in the mid upper arm  Intervention: After the above images were acquired the decision made to proceed with intervention.  I tried to get a wire through the area of stenosis from the initial cannulation site in the upper arm however this was unsuccessful and so I cannulated the brachial artery under ultrasound guidance and placed a 5 French slender sheath.  I gave 3000 units of heparin  through the sheath.  I then advanced a Glidewire across the lesion and used a 660 Mustang balloon to perform balloon venoplasty.  Subsequent imaging revealed improved but suboptimal result and so I reinserted the balloon and repeated the balloon venoplasty at high atmospheric pressure for 1 minute.  Completion imaging showed inline flow with no residual stenosis greater than 20%.  The sheath in the basilic vein was removed and closed with a Monocryl.  The patient was taken to the holding area for brachial sheath removal Impression:  #1  No evidence of central  venous stenosis or anastomotic stenosis within fistula  #2  Greater than 70% mid fistula stenosis treated with a 6 millimeter balloon with residual stenosis less than 20%  #3  Fistula is optimized and ready for use V. Gareld June, M.D., [facility] Office: [phone] Pager:  [phone]   DG Abd Portable 1V Result Date: 09/06/2023 CLINICAL DATA:  NG tube placement. EXAM: PORTABLE ABDOMEN - 1 VIEW COMPARISON:  Radiograph earlier today FINDINGS: Tip and side port of the enteric tube below the diaphragm in the stomach. Diffuse gaseous distention of bowel loops in the abdomen, similar to earlier today. IMPRESSION: Tip and side port of the enteric tube below the diaphragm in the stomach. Electronically Signed   By: Chadwick Colonel M.D.   On: 09/06/2023 18:46   DG Abd Portable 1V Result Date: 09/06/2023 CLINICAL DATA:  Ileus EXAM: PORTABLE ABDOMEN - 1 VIEW COMPARISON:  Sep 05, 2023 FINDINGS: Gas throughout the small bowel loops and colonic loops. With minimal dilatation of the central small bowel loops findings could correlate with some residual small bowel obstructive pattern versus nonspecific ileus. Findings although similar are slightly more prominent than on prior examinations and could correlate with worsening ileus. IMPRESSION: Gas throughout the small bowel loops and colonic loops. With minimal dilatation of the central small bowel loops findings could correlate with some residual small bowel obstructive pattern versus nonspecific ileus. Electronically Signed   By: Fredrich Jefferson M.D.   On:  09/06/2023 13:54    Anti-infectives: Anti-infectives (From admission, onward)    Start     Dose/Rate Route Frequency Ordered Stop   08/31/23 2200  piperacillin -tazobactam (ZOSYN ) IVPB 2.25 g        2.25 g 100 mL/hr over 30 Minutes Intravenous Every 8 hours 08/31/23 1953     08/31/23 0600  piperacillin -tazobactam (ZOSYN ) IVPB 3.375 g  Status:  Discontinued        3.375  g 100 mL/hr over 30 Minutes Intravenous Every 8 hours 08/31/23 0532 08/31/23 1953   08/30/23 2245  piperacillin -tazobactam (ZOSYN ) IVPB 3.375 g        3.375 g 100 mL/hr over 30 Minutes Intravenous  Once 08/30/23 2233 08/31/23 0005       Assessment/Plan: POD 8 - s/p lap appy with JP drain placement for perforated appendicitis [name] 5/6  - persistent ileus, continue NG tube to LIWS. WBC up to 16 - will order CT today, discussed with nephrology (Dr. Edson Graces) who agrees that benefit outweighs risk so IV contrast was ordered - dark sanguinous output from JP drain, Hgb stable at 10.9 - continue IV Zosyn  due to rising WBC. If no evidence of infection on CT then may be able to stop zosyn  - encourage OOB, ambulation - TPN ordered yesterday but delayed due to access. Per [facility] he is supposed to get  FEN - NPO VTE - heparin /plavix  on hold ID - Zosyn   Charlott Converse 09/08/2023

## 2023-09-08 NOTE — Congregational Nurse Program (Signed)
 Attempted to call wife to notify her of transfer to [id number]. Left voicemail on wifes phone other phone number disconnected.

## 2023-09-08 NOTE — Procedures (Signed)
 Patient was seen on dialysis and the procedure was supervised.  BFR 200  Via AVF BP is  120/70.  Unable to cannulate AV fistula even with 17-gauge needle.  Discussed with dialysis nurse.  Consulted vascular surgeon and discussed with Dr. Rosalva Comber about HD line.  Because of leukocytosis, we will go ahead and get temporary HD catheter.  IR consulted.  Christian [name] 09/08/2023

## 2023-09-08 NOTE — Progress Notes (Signed)
 I was called regarding the inability to cannulate Niles's left arm fistula. We discussed temporary versus permanent dialysis catheter.  I am worried about putting in a tunneled dialysis catheter at this time as his white count continues to increase, and he is being treated for perforated appendicitis.  I think Felis would be best served with temporary HD line with plans to revise once his leukocytosis begins to resolve and there are no concerns for ongoing infection.  Would appreciate IR assistance with this.  Kayla Part MD

## 2023-09-08 NOTE — Progress Notes (Signed)
 [facility] night cross cover note:   I was notified by the patient's RN that this patient's NGT, which was in place for decompression in the setting of postoperative ileus and attached to low intermittent wall suction, has become dislodged and fallen out this evening.  Per brief chart review, current plan included continuation of NGT for decompression. Consequently, I have placed orders for placement of new NGT this evening, along with ensuing plain film to confirm associated placement before resuming attachment of NGT to low intermittent wall suction via existing order.    Update: NGT placed, with ensuing CXR associated with recommendation to advance NG tube 10 cm.  Subsequently, RN advanced NG tube from 50 to 60 cm.   Will check updated chest x-ray to reevaluate positioning of NGT prior to initiation of low intermittent wall suction. I have placed order for repeat chest x-ray for this purpose.    Camelia Cavalier, DO Hospitalist

## 2023-09-08 NOTE — Progress Notes (Signed)
 Initial Nutrition Assessment  DOCUMENTATION CODES:   Not applicable  INTERVENTION:  TPN management per pharmacy Discontinue Boost Breeze due to NPO status  NUTRITION DIAGNOSIS:   Inadequate oral intake related to inability to eat as evidenced by NPO status.  GOAL:   Patient will meet greater than or equal to 90% of their needs  MONITOR:   Skin, I & O's, Labs, Weight trends  REASON FOR ASSESSMENT:   Consult Diet education, Enteral/tube feeding initiation and management (change TF to Nepro)  ASSESSMENT:   78 y.o. male presented to the ED with RLQ abdominal pain x 3 days. PMH includes CHF, CAD s/p CABG, HTN, HLD,T2DM, dementia, G-tube and CKD IV. Pt admitted with appendicitis with possible abscess.   5/05 - Admitted  5/06 - Op, s/p laparoscopic appendectomy, 7F JP drain placement 5/07 - diet advanced to clear liquids 5/08 - NPO 5/10 - diet advanced to clear liquids 5/12 - NPO; NGT placed - LIWS 5/13 - Op, s/p fistulogram 5/14 - s/p R internal jugular triple lumen placed; iHD started; TPN initiated  Met with pt in room and family at bedside. Pt took to HD while RD in room. Pt inquiring about getting coffee or water  while RD in room, RD reiterated that pt cannot have anything by mouth at this time. Pt shares that he has had gas, but still no BM. Reports appetite ok at home, wife shares that he often would like to stop and eat out after an appointment. Last thing that he ate at home was soup, the night prior. Discussed pt had a PEG in 2021, but was removed shortly after.  RD discussed plan to utilize TPN until pt bowel function returns. Pt and family agreeable.   Meal Intake 5/06: 25% dinner 5/10: 0% lunch 5/11: 10%  breakfast  Pt reports a UBW of 249# and minimal weight loss of 5# recently. Per EMR, pt weight with a 2.6 kg weight loss over the past 11 months, this is not significant for time frame.   Admission Weight: 101.6 kg  Current Weight: 94.5 kg  Nutrition  Related Medications: NovoLog  0-9 units q4h, Renvela , IV antibiotics  Labs: Sodium 140, Potassium 4.1, BUN 119, Creatinine 8.87, Phosphorus 10.4, Magnesium  2.6  CBG: 66-94 mg/dL x 24 hrs    UOP: 161 mL x 24 hrs NG output: 635 mL x 24 hrs JP drain output: 225 mL x 24 hrs  NUTRITION - FOCUSED PHYSICAL EXAM:  Flowsheet Row Most Recent Value  Orbital Region No depletion  Upper Arm Region No depletion  Thoracic and Lumbar Region No depletion  Buccal Region No depletion  Temple Region No depletion  Clavicle Bone Region Mild depletion  Clavicle and Acromion Bone Region Mild depletion  Scapular Bone Region Mild depletion  Dorsal Hand Mild depletion  Patellar Region No depletion  Anterior Thigh Region No depletion  Posterior Calf Region No depletion  Edema (RD Assessment) Mild  Hair Reviewed  Eyes Reviewed  Mouth Reviewed  Skin Reviewed  Nails Reviewed   Diet Order:   Diet Order             Diet NPO time specified  Diet effective now                  EDUCATION NEEDS:  No education needs have been identified at this time  Skin:  Skin Assessment: Reviewed RN Assessment  Last BM:  5/14  Height:  Ht Readings from Last 1 Encounters:  08/31/23 5\' 9"  (1.753 m)  Weight:  Wt Readings from Last 1 Encounters:  09/08/23 94.5 kg   Ideal Body Weight:  72.7 kg  BMI:  Body mass index is 30.77 kg/m.  Estimated Nutritional Needs:  Kcal:  2150-2350 Protein:  115-135 grams Fluid:  UOP + 1L   Doneta Furbish RD, LDN Clinical Dietitian

## 2023-09-08 NOTE — Significant Event (Signed)
 Rapid Response Event Note   Reason for Call :  RRT called originally at 2309 d/t RED MEWS d/t HR-130s. At that time, RRT was not needed at bedside.  RRT called back at 2315 d/t resp distress, ABD pain, and bleeding around JP drain.   Initial Focused Assessment:  Pt lying in bed with eyes closed. His breathing is tachpneic. He is c/o of 9/10 epigastric pain. Lungs are diminished t/o. ABD large, distended, semifirm, tender to touch, bowel sounds present. NGT flushed and appears to be functioning correctly. JP drain dressed C/D/I(it was just changed). Old dressing has blood on it. Skin hot to touch.   T-99-5(O)-Pt refuses to have rectal temp done. HR-133, bp-139/68, RR-24, SpO2-96% on RA  Interventions:  PCXR-Chronic pleural and parenchymal changes on the left. The heart is enlarged. Suspect mild central congestion and probable new right-sided pleural effusion KUB-Enteric tube tip overlies the distal stomach. Mild diffuse gaseous dilatation of large and small bowel without significant change EKG-ST, long QTc Metoprolol  5mg  IV-HR down to 110s CMP CBC LA Mg Tx to PCU. Plan of Care:  Tx to PCU for closer monitoring. Await lab results. Please call RRT if further assistance needed.   Event Summary:   MD Notified: R[place]ore notified by bedside RN Call Time:2309 Arrival [phone] End Time:0010  Dorrine Gaudy, RN

## 2023-09-08 NOTE — Progress Notes (Signed)
   09/08/23 1752  Vitals  Temp 98.7 F (37.1 C)  Pulse Rate (!) 118  Resp (!) 22  BP 129/68  SpO2 97 %  O2 Device Room Air  Weight (S)  89.8 kg (Bed Scale Weight)  Type of Weight Post-Dialysis  Oxygen Therapy  Patient Activity (if Appropriate) In bed  Pulse Oximetry Type Continuous  Post Treatment  Dialyzer Clearance Clear  Hemodialysis Intake (mL) 0 mL  Liters Processed 24  Fluid Removed (mL) 0 mL  Tolerated HD Treatment Yes  Post-Hemodialysis Comments Tx. Complete without difficulties. Pt.resting and no distress noted. Report call to 3W bedside RN. Admin medication per. order.   Received patient in bed to unit.  Alert and oriented.  Informed consent signed and in chart.   TX duration: 2  Patient tolerated well.  Transported back to the room  Alert, without acute distress.  Hand-off given to patient's nurse.   Access used: Yes Access issues: No  Total UF removed: 0 Medication(s) given: See MAR Post HD VS: See Above Grid Post HD weight: 89. 8 kg   [facility]

## 2023-09-08 NOTE — Progress Notes (Signed)
 [facility]                                                                               Christian [name], is a 78 y.o. male, DOB - 20-Feb-1946, ZOX:[id number] Admit date - 08/30/2023    Outpatient Primary MD for the patient is [name], MD  LOS - 8  days    Brief summary    77 y.o. yo male with CAD status post CABG, postoperative CVA and renal failure , underwent laparoscopic appendectomy for perforated appendicitis by Dr Aniceto Barley on 5/6 developed postop ileus, AKI on CKD stage 4, currently on HD.   Assessment & Plan    Assessment and Plan:    Appendicitis with possible periappendiceal abscess Post op ileus - s/p lap appy 5/6 - continue zosyn  x 5 days post op (or as per surgery if further modification) - drain remains in place with bloody fluid noted.  - Poor intake and no sign of ileus improving.  Surgery recommending TPN.   - patient underwent Placement of a right IJ approach triple lumen temp HD cath  on 5/14 as putting in a tunneled HD catheter at this time with leukocytosis might be risky and more prone to infection.  - continue NGT for decompression - gen surgery, IR, vascular surgery on board.    Acute on CKDIV ATN - patient has history of CKD4. Baseline creat ~ 3.2 - 3.5, eGFR~ 17 - he has required HD the past - possibly volume depletion from surgery/blood loss and when he had severe hypoTN on 5/7; I don't think due to zosyn  but AIN always a possibility as well  - seems to be ATN with uptrending creat/BUN; has been slowly approaching need for dialysis - Nephrology has been following also - Vascular surgery consulted; appreciate assistance.  Fistulogram performed 09/07/2023 and balloon venoplasty performed and left arm fistula ready for use.   Unfortunately left arm fistula cannot be cannulated at this time.     ABLA Hemoglobin around 10 and stable.    HTN Well controlled.    DMII CBG (last 3)  Recent Labs    09/08/23 0544  09/08/23 0811 09/08/23 1132  GLUCAP 75 94 90   Resume SSI.    CAD - s/p CABG - cleared by cardiology prior to surgery  Thrombocytosis Monitor.         Malnutrition Type:  Nutrition Problem: Inadequate oral intake Etiology: inability to eat   Malnutrition Characteristics:  Signs/Symptoms: NPO status   Nutrition Interventions:  Interventions: TPN  Estimated body mass index is 29.33 kg/m as calculated from the following:   Height as of this encounter: 5\' 9"  (1.753 m).   Weight as of this encounter: 90.1 kg.  Code Status: full code.  DVT Prophylaxis:  Place and maintain sequential compression device Start: 09/01/23 2001 SCDs Start: 08/31/23 0415   Level of Care: Level of care: Progressive Family Communication: None at bedside.   Disposition Plan:     Remains inpatient appropriate:  PENDING.   Procedures:    Consultants:   General surgery Nephrology Vascular surgery.  IR.   Antimicrobials:   Anti-infectives (From admission, onward)    Start  Dose/Rate Route Frequency Ordered Stop   08/31/23 2200  piperacillin -tazobactam (ZOSYN ) IVPB 2.25 g        2.25 g 100 mL/hr over 30 Minutes Intravenous Every 8 hours 08/31/23 1953     08/31/23 0600  piperacillin -tazobactam (ZOSYN ) IVPB 3.375 g  Status:  Discontinued        3.375 g 100 mL/hr over 30 Minutes Intravenous Every 8 hours 08/31/23 0532 08/31/23 1953   08/30/23 2245  piperacillin -tazobactam (ZOSYN ) IVPB 3.375 g        3.375 g 100 mL/hr over 30 Minutes Intravenous  Once 08/30/23 2233 08/31/23 0005        Medications  Scheduled Meds:  Chlorhexidine  Gluconate Cloth  6 each Topical Daily   Chlorhexidine  Gluconate Cloth  6 each Topical Q0600   Chlorhexidine  Gluconate Cloth  6 each Topical Q0600   fluticasone  furoate-vilanterol  1 puff Inhalation Daily   insulin  aspart  0-9 Units Subcutaneous Q4H   sevelamer  carbonate  0.8 g Oral TID WC   sodium chloride  flush  3 mL Intravenous Q12H    Continuous Infusions:  piperacillin -tazobactam (ZOSYN )  IV 2.25 g (09/08/23 1412)   TPN ADULT (ION)     PRN Meds:.albuterol , dextrose , HYDROmorphone  (DILAUDID ) injection, LORazepam , methocarbamol  (ROBAXIN ) injection, naLOXone (NARCAN)  injection, simethicone , sodium chloride  flush, traMADol     Subjective:   Christian [name] was seen and examined today.  Reports having abdominal pain.  Has NG tube. Nauseated. JP drain in place.    Objective:   Vitals:   09/08/23 1135 09/08/23 1525 09/08/23 1542 09/08/23 1605  BP: 134/68 (!) 144/82 127/70 133/68  Pulse: (!) 120 (!) 111 (!) 115 (!) 118  Resp: 20 (!) 22 20 (!) 21  Temp: 98.7 F (37.1 C) (!) 97.5 F (36.4 C)    TempSrc: Oral     SpO2: (!) 89% 97% 97% 100%  Weight:  (S) 90.1 kg    Height:        Intake/Output Summary (Last 24 hours) at 09/08/2023 1658 Last data filed at 09/08/2023 0634 Gross per 24 hour  Intake --  Output 1510 ml  Net -1510 ml   Filed Weights   09/07/23 0500 09/08/23 0500 09/08/23 1525  Weight: 97.5 kg 94.5 kg (S) 90.1 kg     Exam General exam: Appears calm and comfortable  Respiratory system: Clear to auscultation. Respiratory effort normal. Cardiovascular system: S1 & S2 heard, RRR. No JVD,  Gastrointestinal system: Abdomen is soft distended, JP drain.  Central nervous system: Alert and oriented. No focal neurological deficits. Extremities: warm extremities.  Skin: No rashes,  Psychiatry: mood is appropriate.    Data Reviewed:  I have personally reviewed following labs and imaging studies   CBC Lab Results  Component Value Date   WBC 16.8 (H) 09/08/2023   RBC 3.37 (L) 09/08/2023   HGB 10.9 (L) 09/08/2023   HCT 32.6 (L) 09/08/2023   MCV 96.7 09/08/2023   MCH 32.3 09/08/2023   PLT 406 (H) 09/08/2023   MCHC 33.4 09/08/2023   RDW 16.4 (H) 09/08/2023   LYMPHSABS 0.5 (L) 09/07/2023   MONOABS 0.7 09/07/2023   EOSABS 0.1 09/07/2023   BASOSABS 0.1 09/07/2023     Last metabolic panel Lab  Results  Component Value Date   NA 140 09/08/2023   K 4.1 09/08/2023   CL 96 (L) 09/08/2023   CO2 19 (L) 09/08/2023   BUN 119 (H) 09/08/2023   CREATININE 8.87 (H) 09/08/2023   GLUCOSE 82 09/08/2023   GFRNONAA 6 (  L) 09/08/2023   GFRAA 19 (L) 10/18/2019   CALCIUM  8.2 (L) 09/08/2023   PHOS 10.4 (H) 09/07/2023   PROT 6.2 (L) 09/08/2023   ALBUMIN  1.9 (L) 09/08/2023   LABGLOB 2.4 05/22/2021   AGRATIO 1.9 05/22/2021   BILITOT 2.1 (H) 09/08/2023   ALKPHOS 62 09/08/2023   AST 23 09/08/2023   ALT 20 09/08/2023   ANIONGAP 25 (H) 09/08/2023    CBG (last 3)  Recent Labs    09/08/23 0544 09/08/23 0811 09/08/23 1132  GLUCAP 75 94 90      Coagulation Profile: No results for input(s): "INR", "PROTIME" in the last 168 hours.   Radiology Studies: CT ABDOMEN PELVIS W CONTRAST Result Date: 09/08/2023 CLINICAL DATA:  Abdominal pain. Postop day 8 for laparoscopic appendectomy. EXAM: CT ABDOMEN AND PELVIS WITH CONTRAST TECHNIQUE: Multidetector CT imaging of the abdomen and pelvis was performed using the standard protocol following bolus administration of intravenous contrast. RADIATION DOSE REDUCTION: This exam was performed according to the departmental dose-optimization program which includes automated exposure control, adjustment of the mA and/or kV according to patient size and/or use of iterative reconstruction technique. CONTRAST:  75mL OMNIPAQUE  IOHEXOL  350 MG/ML SOLN COMPARISON:  08/30/2023 and 09/02/2023 FINDINGS: Lower chest: Ventral hernia containing mediastinal and likely upper abdominal adipose tissues extending through a lower sternal defect with abnormal stranding within the herniated adipose tissues and abnormal stranding in the mediastinal adipose tissue anterior to the pericardium as on image 2 series 3. Moderate right and small left pleural effusions with pleural enhancement and passive atelectasis. Calcified pleural plaques at the lung bases. Rounded atelectasis in the left lower  lobe and scarring in the lingula. Circumflex and right coronary artery atherosclerosis along with descending thoracic aortic atherosclerosis. Hepatobiliary: Dependent density in the gallbladder compatible with either vicarious excretion of contrast (favored) or sludge. No significant hepatic parenchymal lesion identified. No biliary dilatation. Pancreas: Unremarkable Spleen: Hypodense partially exophytic posterior splenic mass measures 1.7 cm in diameter with portal venous phase density 64 Hounsfield units and delayed phase density 75 Hounsfield units, this may be a hemangioma but is technically nonspecific. Adrenals/Urinary Tract: Stable substantial adrenal thickening bilaterally compatible with hyperplasia. Stable benign renal cysts warranting no further imaging workup. Foley catheter noted in the urinary bladder which is otherwise empty. Stomach/Bowel: Nasogastric tube tip extends through the stomach and terminates in the descending duodenum. Scattered sigmoid colon diverticula. A drain is present terminating in the right lower quadrant and the drain terminates within a complex 5.5 by 11.7 by 4.3 cm (volume = 140 cm^3) collection immediately adjacent to a loop of small bowel in the right lower quadrant, and extending up adjacent to the appendectomy site. This complex collection was smaller on the prior exam. The contrast medium in the terminal ileum and cecum is of higher density than the complex fluid in this collection, although strictly speaking a leak containing only a small amount of diluted contrast is not totally excluded. This could alternatively be an abscess but the poor response to the drain is of uncertain significance, correlate with chronic door aspirate from drainage. Is the drain fluid bloody or purulent? Substantially reduced complex fluid in the left paracolic gutter compared to previous. Small amount of complex fluid in the pelvis, favoring hemoperitoneum. Scattered loops of mildly dilated  small bowel up to about 3.5 cm in diameter, potentially from postoperative ileus, correlate with return of bowel function after surgery. There is likely some complex fluid along the right lower quadrant omentum for example on image  68 series 3. Some of the orally administered contrast extends around all the way to the rectum. Small amount of complex fluid adjacent to the sigmoid colon. Vascular/Lymphatic: Atherosclerosis is present, including aortoiliac atherosclerotic disease. Reproductive: Dorsal penoscrotal edema. Other: Previous small amount of free intraperitoneal gas has resolved. Musculoskeletal: Lower sternal defect as noted above. 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 Lumbar spondylosis and degenerative disc disease. Suspected right foraminal impingement at L2-3 and left foraminal impingement at L4-5. IMPRESSION: 1. The drain terminates within a complex 5.5 by 11.7 by 4.3 cm (volume = 140 cc) collection immediately adjacent to a loop of small bowel in the right lower quadrant, and extending up adjacent to the appendectomy site. This collection was smaller on the prior exam. Is the drain fluid bloody or purulent? There are other sites of complex fluid in the pelvis and adjacent to the sigmoid colon possibly from hemoperitoneum, and reduced complex fluid along the left paracolic gutter compared to previous. 2. Scattered loops of mildly dilated small bowel up to about 3.5 cm in diameter, potentially from postoperative ileus, correlate with return of bowel function after surgery. 3. Moderate right and small left pleural effusions with pleural enhancement and passive atelectasis. 4. Ventral hernia containing mediastinal and likely upper abdominal adipose tissues extending through a lower sternal defect with abnormal stranding within the herniated adipose tissues and abnormal stranding in the mediastinal adipose tissue anterior to the pericardium. 5. Rounded atelectasis in  the left lower lobe and scarring in the lingula. 6. Dependent density in the gallbladder compatible with either vicarious excretion of contrast (favored) or sludge. 7. 1.7 cm hypodense partially exophytic posterior splenic mass, this may be a hemangioma but is technically nonspecific. 8. Lumbar spondylosis and degenerative disc disease with suspected right foraminal impingement at L2-3 and left foraminal impingement at L4-5. 9. Mild dorsal penoscrotal edema. 10.  Aortic Atherosclerosis (ICD10-I70.0). Electronically Signed   By: Freida Jes M.D.   On: 09/08/2023 14:11   IR US  Guide Vasc Access Right Result Date: 09/08/2023 INDICATION: 78 year old male presents for temporary hemodialysis catheter EXAM: IMAGE GUIDED TEMPORARY HEMODIALYSIS CATHETER MEDICATIONS: None ANESTHESIA/SEDATION: None FLUOROSCOPY: Radiation Exposure Index (as provided by the fluoroscopic device): 1.8 mGy Kerma COMPLICATIONS: None PROCEDURE: Informed written consent was obtained from the patient's family after a discussion of the risks, benefits, and alternatives to treatment. Questions regarding the procedure were encouraged and answered. The right neck was prepped with chlorhexidine  in a sterile fashion, and a sterile drape was applied covering the operative field. Maximum barrier sterile technique with sterile gowns and gloves were used for the procedure. A timeout was performed prior to the initiation of the procedure. A micropuncture kit was utilized to access the right internal jugular vein under direct, real-time ultrasound guidance after the overlying soft tissues were anesthetized with 1% lidocaine  with epinephrine . Ultrasound image documentation was performed. The microwire was kinked to measure appropriate catheter length. A stiff glidewire was advanced to the level of the IVC. A 15 cm hemodialysis catheter was then placed over the wire. Final catheter positioning was confirmed and documented with a spot radiographic image.  The catheter aspirates and flushes normally. The catheter was flushed with appropriate volume heparin  dwells. Dressings were applied. The patient tolerated the procedure well without immediate post procedural complication. IMPRESSION: Status post right IJ temporary hemodialysis catheter Signed, Marciano Settles. Rexine Cater, RPVI [facility] Electronically Signed   By: Myrlene Asper D.O.  On: 09/08/2023 13:20   DG CHEST PORT 1 VIEW Result Date: 09/07/2023 CLINICAL DATA:  Respiratory distress EXAM: PORTABLE CHEST 1 VIEW COMPARISON:  04/06/2023, CT chest 12/12/2020, chest x-ray 05/22/2021 FINDINGS: Sternotomy. Stable cardiomediastinal silhouettewhich appears slightly enlarged. There are chronic pleural and parenchymal changes on the left. There is new suspected right pleural effusion. Suspicion of vascular congestion and possible mild edema IMPRESSION: Chronic pleural and parenchymal changes on the left. The heart is enlarged. Suspect mild central congestion and probable new right-sided pleural effusion Electronically Signed   By: Esmeralda Hedge M.D.   On: 09/07/2023 23:54   DG Abd Portable 1V Result Date: 09/07/2023 CLINICAL DATA:  Abdomen pain EXAM: PORTABLE ABDOMEN - 1 VIEW COMPARISON:  09/06/2023 FINDINGS: Enteric tube tip overlies the distal stomach. Mild diffuse gaseous dilatation of large and small bowel. IMPRESSION: Enteric tube tip overlies the distal stomach. Mild diffuse gaseous dilatation of large and small bowel without significant change Electronically Signed   By: Esmeralda Hedge M.D.   On: 09/07/2023 23:50   VAS US  UPPER EXTREMITY ARTERIAL DUPLEX Result Date: 09/07/2023  UPPER EXTREMITY DUPLEX STUDY Patient Name:  HARUMI GULLICK  Date of Exam:   09/07/2023 Medical Rec #: [id number]        Accession #:    [id number] Date of Birth: 1945/12/26       Patient Gender: M Patient Age:   51 years Exam Location:  [facility] Procedure:      VAS  US  UPPER EXTREMITY ARTERIAL DUPLEX Referring Phys: EMMA COLLINS --------------------------------------------------------------------------------  Indications: Pain from vascular device, graft or implant.  Risk Factors:  Hypertension, hyperlipidemia, Diabetes. Other Factors: 09/18/2020 - ARTERIOVENOUS (AV) FISTULA CREATION LEFT VERSUS GRAFT Comparison Study: No prior studies. Performing Technologist: Lerry Ransom RVT  Examination Guidelines: A complete evaluation includes B-mode imaging, spectral Doppler, color Doppler, and power Doppler as needed of all accessible portions of each vessel. Bilateral testing is considered an integral part of a complete examination. Limited examinations for reoccurring indications may be performed as noted.  Left Doppler Findings: +--------------+----------+---------+--------+------------------+ Site          PSV (cm/s)Waveform StenosisComments           +--------------+----------+---------+--------+------------------+ Subclavian Mid76        triphasic                           +--------------+----------+---------+--------+------------------+ Axillary      134       triphasic                           +--------------+----------+---------+--------+------------------+ Brachial Prox 85        triphasic                           +--------------+----------+---------+--------+------------------+ Brachial Dist 69        triphasic                           +--------------+----------+---------+--------+------------------+ Radial Prox                              Unable to insonate +--------------+----------+---------+--------+------------------+ Radial Mid                               Unable to  insonate +--------------+----------+---------+--------+------------------+ Radial Dist                              Unable to insonate +--------------+----------+---------+--------+------------------+ Ulnar Prox    69        triphasic                            +--------------+----------+---------+--------+------------------+ Ulnar Mid     66        triphasic                           +--------------+----------+---------+--------+------------------+ Ulnar Dist    67        triphasic                           +--------------+----------+---------+--------+------------------+ Flow is detected in the left brachio-basilic fistula.  Summary:  Left: No obstruction visualized in the left upper extremity. *See table(s) above for measurements and observations. Electronically signed by Delaney Fearing on 09/07/2023 at 4:09:38 PM.    Final    VAS US  UPPER EXT VEIN MAPPING (PRE-OP AVF) Result Date: 09/07/2023 UPPER EXTREMITY VEIN MAPPING Patient Name:  TOLGA VOLKMAN  Date of Exam:   09/07/2023 Medical Rec #: [id number]        Accession #:    [id number] Date of Birth: June 06, 1945       Patient Gender: M Patient Age:   44 years Exam Location[facility]  [facility] Procedure:      VAS US  UPPER EXT VEIN MAPPING (PRE-OP AVF) Referring Phys: EMMA COLLINS --------------------------------------------------------------------------------  Indications: Pre-access. Comparison Study: No prior studies. Performing Technologist: Lerry Ransom RVT  Examination Guidelines: A complete evaluation includes B-mode imaging, spectral Doppler, color Doppler, and power Doppler as needed of all accessible portions of each vessel. Bilateral testing is considered an integral part of a complete examination. Limited examinations for reoccurring indications may be performed as noted. +-----------------+-------------+----------+--------+ Right Cephalic   Diameter (cm)Depth (cm)Findings +-----------------+-------------+----------+--------+ Shoulder             0.06        0.34            +-----------------+-------------+----------+--------+ Prox upper arm       0.04        0.39            +-----------------+-------------+----------+--------+ Mid upper arm        0.04         0.46            +-----------------+-------------+----------+--------+ Dist upper arm       0.08        0.22            +-----------------+-------------+----------+--------+ Antecubital fossa    0.06        0.33            +-----------------+-------------+----------+--------+ Prox forearm         0.13        0.40            +-----------------+-------------+----------+--------+ Mid forearm          0.19        0.46            +-----------------+-------------+----------+--------+ Dist forearm         0.12        0.37            +-----------------+-------------+----------+--------+ +-----------------+-------------+----------+---------+  Right Basilic    Diameter (cm)Depth (cm)Findings  +-----------------+-------------+----------+---------+ Shoulder             0.46        1.10             +-----------------+-------------+----------+---------+ Prox upper arm       0.34        1.10             +-----------------+-------------+----------+---------+ Mid upper arm        0.32        1.40             +-----------------+-------------+----------+---------+ Dist upper arm       0.33        0.73   branching +-----------------+-------------+----------+---------+ Antecubital fossa    0.22        0.62             +-----------------+-------------+----------+---------+ Prox forearm         0.21        0.40             +-----------------+-------------+----------+---------+ Mid forearm          0.19        0.38             +-----------------+-------------+----------+---------+ Distal forearm       0.19        0.29             +-----------------+-------------+----------+---------+ +-----------------+-------------+----------+--------+ Left Cephalic    Diameter (cm)Depth (cm)Findings +-----------------+-------------+----------+--------+ Shoulder             0.50        0.22            +-----------------+-------------+----------+--------+ Prox upper arm        0.05        0.31            +-----------------+-------------+----------+--------+ Mid upper arm        0.04        0.52            +-----------------+-------------+----------+--------+ Dist upper arm       0.05        0.46            +-----------------+-------------+----------+--------+ Antecubital fossa    0.06        0.41            +-----------------+-------------+----------+--------+ Prox forearm         0.12        0.47            +-----------------+-------------+----------+--------+ Mid forearm          0.00        0.44            +-----------------+-------------+----------+--------+ Dist forearm         0.06        0.36            +-----------------+-------------+----------+--------+ +-----------------+-------------+----------+---------+ Left Basilic     Diameter (cm)Depth (cm)Findings  +-----------------+-------------+----------+---------+ Shoulder             0.84        1.40             +-----------------+-------------+----------+---------+ Prox upper arm       0.38        0.41             +-----------------+-------------+----------+---------+ Mid upper arm  HD Access +-----------------+-------------+----------+---------+ Dist upper arm                          HD Access +-----------------+-------------+----------+---------+ Antecubital fossa    0.14        0.45             +-----------------+-------------+----------+---------+ Prox forearm         0.05        0.24             +-----------------+-------------+----------+---------+ Mid forearm          0.05        0.22             +-----------------+-------------+----------+---------+ Distal forearm       0.05        0.32             +-----------------+-------------+----------+---------+ *See table(s) above for measurements and observations.  Diagnosing physician: Delaney Fearing Electronically signed by Delaney Fearing on 09/07/2023 at 4:09:10 PM.    Final     PERIPHERAL VASCULAR CATHETERIZATION Result Date: 09/07/2023 Images from the original result were not included. Patient name: TOUGER WOLDEN MRN: [id number] DOB: Aug 07, 1945 Sex: male 09/07/2023 Pre-operative Diagnosis: End-stage renal disease Post-operative diagnosis:  Same Surgeon:  Gareld June Procedure Performed:  1.  Ultrasound-guided access, left basilic vein  2.  Ultrasound-guided access, left brachial artery  3.  Fistulogram  4.  Balloon venoplasty left basilic vein  5.  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  His fistula does not have a good thrill to it and so he comes in today for fistulogram Procedure:  The patient was identified in the holding area and taken to room 8.  The patient was then placed supine on the table and prepped and draped in the usual sterile fashion.  A time out was called.  Conscious sedation was administered with the use of IV fentanyl  and Versed  under continuous physician and nurse monitoring.  Heart rate, blood pressure, and oxygen saturations were continuously monitored.  Total sedation time was 60 minutes ultrasound was used to evaluate the fistula.  The vein was patent and compressible.  A digital ultrasound image was acquired.  The fistula was then accessed under ultrasound guidance using a micropuncture needle.  An 018 wire was then asvanced without resistance and a micropuncture sheath was placed.  Contrast injections were then performed through the sheath. Findings: No central venous stenosis.  The arteriovenous anastomosis is widely patent.  There is a stent just proximal to the arteriovenous anastomosis which is patent without significant stenosis.  There is however a turn in the vein which is associated with a greater than 70% stenosis.  This is in the mid upper arm  Intervention: After the above images  were acquired the decision made to proceed with intervention.  I tried to get a wire through the area of stenosis from the initial cannulation site in the upper arm however this was unsuccessful and so I cannulated the brachial artery under ultrasound guidance and placed a 5 French slender sheath.  I gave 3000 units of heparin  through the sheath.  I then advanced a Glidewire across the lesion and used a 660 Mustang balloon to perform balloon venoplasty.  Subsequent imaging revealed improved but suboptimal result and so I reinserted the balloon and repeated the  balloon venoplasty at high atmospheric pressure for 1 minute.  Completion imaging showed inline flow with no residual stenosis greater than 20%.  The sheath in the basilic vein was removed and closed with a Monocryl.  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 Gareld June, M.D., [facility] [place] Office: [phone] Pager:  [phone]       Feliciana Horn M.D. [facility] 09/08/2023, 4:58 PM  Available via Epic secure chat 7am-7pm After 7 pm, please refer to night coverage provider listed on amion.

## 2023-09-08 NOTE — Progress Notes (Signed)
 [facility] NEPHROLOGY PROGRESS NOTE  Assessment/ Plan: Pt is a 78 y.o. yo male with CAD status post CABG, underwent laparoscopic appendectomy on 5/7 developed postop ileus, AKI on CKD.  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  UA with pyuria and kidney ultrasound without hydronephrosis. No sign of renal recovery with worsening azotemia. Unable to do HD today because of access issues as discussed below.  Plan for next dialysis when he has line. Contacted renal navigator to arrange outpatient HD  # Vascular dialysis access: Patient has old AV fistula which was stenosed therefore the patient underwent fistulogram and angioplasty on 5/13.  On 5/14, the AV fistula is unable to cannulate even with small needle 17G.  Discussed with vascular surgeon and consulted IR for temporary HD catheter.  When leukocytosis improves then we need to convert to permanent HD catheter versus trying to cannulate AV fistula again.  # Appendicitis with possible abscess and postop ileus: On antibiotics and drain per surgical team.  # Acute blood loss anemia, CT angio negative for bleeding.  Transfuse as needed.  # Metabolic acidosis: On sodium bicarbonate , starting dialysis soon.  # CKD-MBD, hyperphosphatemia: Started sevelamer , pending PTH level.  Will consult dietitian to change tube feed to Nepro.  Subjective: Seen and examined.  Unable to receive dialysis because of access problem.  No change in clinical condition.  No sign of renal recovery.  Denies nausea, vomiting, chest pain.  Objective Vital signs in last 24 hours: Vitals:   09/08/23 0100 09/08/23 0352 09/08/23 0500 09/08/23 0813  BP: 120/70 121/63    Pulse: (!) 122 (!) 123    Resp: (!) 30 18    Temp: 99.5 F (37.5 C) 99.3 F (37.4 C)  97.9 F (36.6 C)  TempSrc: Axillary Oral  Oral  SpO2: 92% 94%     Weight:   94.5 kg   Height:       Weight change: -3.023 kg  Intake/Output Summary (Last 24 hours) at 09/08/2023 1025 Last data filed at 09/08/2023 0634 Gross per 24 hour  Intake --  Output 1510 ml  Net -1510 ml       Labs: RENAL PANEL Recent Labs  Lab 09/04/23 0723 09/05/23 0810 09/06/23 0652 09/07/23 0323 09/08/23 0024  NA 134* 135 136 136 140  K 3.6 4.1 4.4 3.8 4.1  CL 98 100 99 95* 96*  CO2 20* 16* 16* 21* 19*  GLUCOSE 88 73 104* 72 82  BUN 114* 115* 119* 117* 119*  CREATININE 6.63* 7.31* 8.15* 8.66* 8.87*  CALCIUM  7.5* 7.9* 8.3* 8.1* 8.2*  MG 2.4 2.5* 2.7* 2.4 2.6*  PHOS  --  9.4* 11.0* 10.4*  --   ALBUMIN   --  2.1* 2.0* 1.7* 1.9*    Liver Function Tests: Recent Labs  Lab 09/06/23 0652 09/07/23 0323 09/08/23 0024  AST  --   --  23  ALT  --   --  20  ALKPHOS  --   --  62  BILITOT  --   --  2.1*  PROT  --   --  6.2*  ALBUMIN  2.0* 1.7* 1.9*   No results for input(s): "LIPASE", "AMYLASE" in the last 168 hours.  No results for input(s): "AMMONIA" in the last 168 hours. CBC: Recent Labs    01/18/23 1118 04/06/23 0910 09/04/23 1646 09/05/23 1008 09/06/23 5784 09/07/23  0981 09/08/23 0024  HGB  --    < > 11.2* 10.4* 11.4* 10.0* 10.9*  MCV  --    < >  --   --  98.9 95.4 96.7  VITAMINB12 565  --   --   --   --   --   --    < > = values in this interval not displayed.    Cardiac Enzymes: No results for input(s): "CKTOTAL", "CKMB", "CKMBINDEX", "TROPONINI" in the last 168 hours. CBG: Recent Labs  Lab 09/07/23 2019 09/07/23 2211 09/08/23 0133 09/08/23 0544 09/08/23 0811  GLUCAP 71 81 83 75 94    Iron Studies: No results for input(s): "IRON", "TIBC", "TRANSFERRIN", "FERRITIN" in the last 72 hours. Studies/Results: DG CHEST PORT 1 VIEW Result Date: 09/07/2023 CLINICAL DATA:  Respiratory distress EXAM: PORTABLE CHEST 1 VIEW COMPARISON:  04/06/2023, CT chest 12/12/2020, chest x-ray 05/22/2021 FINDINGS: Sternotomy. Stable cardiomediastinal  silhouettewhich appears slightly enlarged. There are chronic pleural and parenchymal changes on the left. There is new suspected right pleural effusion. Suspicion of vascular congestion and possible mild edema IMPRESSION: Chronic pleural and parenchymal changes on the left. The heart is enlarged. Suspect mild central congestion and probable new right-sided pleural effusion Electronically Signed   By: Esmeralda Hedge M.D.   On: 09/07/2023 23:54   DG Abd Portable 1V Result Date: 09/07/2023 CLINICAL DATA:  Abdomen pain EXAM: PORTABLE ABDOMEN - 1 VIEW COMPARISON:  09/06/2023 FINDINGS: Enteric tube tip overlies the distal stomach. Mild diffuse gaseous dilatation of large and small bowel. IMPRESSION: Enteric tube tip overlies the distal stomach. Mild diffuse gaseous dilatation of large and small bowel without significant change Electronically Signed   By: Esmeralda Hedge M.D.   On: 09/07/2023 23:50   VAS US  UPPER EXTREMITY ARTERIAL DUPLEX Result Date: 09/07/2023  UPPER EXTREMITY DUPLEX STUDY Patient Name:  ARNOL COPPEDGE  Date of Exam:   09/07/2023 Medical Rec #: [id number]        Accession #:    [id number] Date of Birth: 19-Jun-1945       Patient Gender: M Patient Age:   49 years Exam Location:  [facility] Procedure:      VAS US  UPPER EXTREMITY ARTERIAL DUPLEX Referring Phys: EMMA COLLINS --------------------------------------------------------------------------------  Indications: Pain from vascular device, graft or implant.  Risk Factors:  Hypertension, hyperlipidemia, Diabetes. Other Factors: 09/18/2020 - ARTERIOVENOUS (AV) FISTULA CREATION LEFT VERSUS GRAFT Comparison Study: No prior studies. Performing Technologist: Lerry Ransom RVT  Examination Guidelines: A complete evaluation includes B-mode imaging, spectral Doppler, color Doppler, and power Doppler as needed of all accessible portions of each vessel. Bilateral testing is considered an integral part of a complete examination. Limited examinations for  reoccurring indications may be performed as noted.  Left Doppler Findings: +--------------+----------+---------+--------+------------------+ Site          PSV (cm/s)Waveform StenosisComments           +--------------+----------+---------+--------+------------------+ Subclavian Mid76        triphasic                           +--------------+----------+---------+--------+------------------+ Axillary      134       triphasic                           +--------------+----------+---------+--------+------------------+ Brachial Prox 85        triphasic                           +--------------+----------+---------+--------+------------------+  Brachial Dist 69        triphasic                           +--------------+----------+---------+--------+------------------+ Radial Prox                              Unable to insonate +--------------+----------+---------+--------+------------------+ Radial Mid                               Unable to insonate +--------------+----------+---------+--------+------------------+ Radial Dist                              Unable to insonate +--------------+----------+---------+--------+------------------+ Ulnar Prox    69        triphasic                           +--------------+----------+---------+--------+------------------+ Ulnar Mid     66        triphasic                           +--------------+----------+---------+--------+------------------+ Ulnar Dist    67        triphasic                           +--------------+----------+---------+--------+------------------+ Flow is detected in the left brachio-basilic fistula.  Summary:  Left: No obstruction visualized in the left upper extremity. *See table(s) above for measurements and observations. Electronically signed by Delaney Fearing on 09/07/2023 at 4:09:38 PM.    Final    VAS US  UPPER EXT VEIN MAPPING (PRE-OP AVF) Result Date: 09/07/2023 UPPER EXTREMITY VEIN  MAPPING Patient Name:  STACY SORBY  Date of Exam:   09/07/2023 Medical Rec #: [id number]        Accession #:    [id number] Date of Birth: 1946/03/05       Patient Gender: M Patient Age:   89 years Exam [facility] Procedure:      VAS US  UPPER EXT VEIN MAPPING (PRE-OP AVF) Referring Phys: EMMA COLLINS --------------------------------------------------------------------------------  Indications: Pre-access. Comparison Study: No prior studies. Performing Technologist: Lerry Ransom RVT  Examination Guidelines: A complete evaluation includes B-mode imaging, spectral Doppler, color Doppler, and power Doppler as needed of all accessible portions of each vessel. Bilateral testing is considered an integral part of a complete examination. Limited examinations for reoccurring indications may be performed as noted. +-----------------+-------------+----------+--------+ Right Cephalic   Diameter (cm)Depth (cm)Findings +-----------------+-------------+----------+--------+ Shoulder             0.06        0.34            +-----------------+-------------+----------+--------+ Prox upper arm       0.04        0.39            +-----------------+-------------+----------+--------+ Mid upper arm        0.04        0.46            +-----------------+-------------+----------+--------+ Dist upper arm       0.08        0.22            +-----------------+-------------+----------+--------+ Antecubital fossa  0.06        0.33            +-----------------+-------------+----------+--------+ Prox forearm         0.13        0.40            +-----------------+-------------+----------+--------+ Mid forearm          0.19        0.46            +-----------------+-------------+----------+--------+ Dist forearm         0.12        0.37            +-----------------+-------------+----------+--------+ +-----------------+-------------+----------+---------+ Right Basilic    Diameter  (cm)Depth (cm)Findings  +-----------------+-------------+----------+---------+ Shoulder             0.46        1.10             +-----------------+-------------+----------+---------+ Prox upper arm       0.34        1.10             +-----------------+-------------+----------+---------+ Mid upper arm        0.32        1.40             +-----------------+-------------+----------+---------+ Dist upper arm       0.33        0.73   branching +-----------------+-------------+----------+---------+ Antecubital fossa    0.22        0.62             +-----------------+-------------+----------+---------+ Prox forearm         0.21        0.40             +-----------------+-------------+----------+---------+ Mid forearm          0.19        0.38             +-----------------+-------------+----------+---------+ Distal forearm       0.19        0.29             +-----------------+-------------+----------+---------+ +-----------------+-------------+----------+--------+ Left Cephalic    Diameter (cm)Depth (cm)Findings +-----------------+-------------+----------+--------+ Shoulder             0.50        0.22            +-----------------+-------------+----------+--------+ Prox upper arm       0.05        0.31            +-----------------+-------------+----------+--------+ Mid upper arm        0.04        0.52            +-----------------+-------------+----------+--------+ Dist upper arm       0.05        0.46            +-----------------+-------------+----------+--------+ Antecubital fossa    0.06        0.41            +-----------------+-------------+----------+--------+ Prox forearm         0.12        0.47            +-----------------+-------------+----------+--------+ Mid forearm          0.00        0.44            +-----------------+-------------+----------+--------+ Dist forearm  0.06        0.36             +-----------------+-------------+----------+--------+ +-----------------+-------------+----------+---------+ Left Basilic     Diameter (cm)Depth (cm)Findings  +-----------------+-------------+----------+---------+ Shoulder             0.84        1.40             +-----------------+-------------+----------+---------+ Prox upper arm       0.38        0.41             +-----------------+-------------+----------+---------+ Mid upper arm                           HD Access +-----------------+-------------+----------+---------+ Dist upper arm                          HD Access +-----------------+-------------+----------+---------+ Antecubital fossa    0.14        0.45             +-----------------+-------------+----------+---------+ Prox forearm         0.05        0.24             +-----------------+-------------+----------+---------+ Mid forearm          0.05        0.22             +-----------------+-------------+----------+---------+ Distal forearm       0.05        0.32             +-----------------+-------------+----------+---------+ *See table(s) above for measurements and observations.  Diagnosing physician: Delaney Fearing Electronically signed by Delaney Fearing on 09/07/2023 at 4:09:10 PM.    Final    PERIPHERAL VASCULAR CATHETERIZATION Result Date: 09/07/2023 Images from the original result were not included. Patient name: CHRISTOS LAPRISE MRN: [id number] DOB: 1945/06/15 Sex: male 09/07/2023 Pre-operative Diagnosis: End-stage renal disease Post-operative diagnosis:  Same Surgeon:  Gareld June Procedure Performed:  1.  Ultrasound-guided access, left basilic vein  2.  Ultrasound-guided access, left brachial artery  3.  Fistulogram  4.  Balloon venoplasty left basilic vein  5.  Conscious sedation, 60 minutes  Indications: This is a 78 year old gentleman with history of brachiobasilic fistula and subsequent intervention with stenting who is now getting ready to restart  dialysis.  His fistula does not have a good thrill to it and so he comes in today for fistulogram Procedure:  The patient was identified in the holding area and taken to room 8.  The patient was then placed supine on the table and prepped and draped in the usual sterile fashion.  A time out was called.  Conscious sedation was administered with the use of IV fentanyl  and Versed  under continuous physician and nurse monitoring.  Heart rate, blood pressure, and oxygen saturations were continuously monitored.  Total sedation time was 60 minutes ultrasound was used to evaluate the fistula.  The vein was patent and compressible.  A digital ultrasound image was acquired.  The fistula was then accessed under ultrasound guidance using a micropuncture needle.  An 018 wire was then asvanced without resistance and a micropuncture sheath was placed.  Contrast injections were then performed through the sheath. Findings: No central venous stenosis.  The arteriovenous anastomosis is widely patent.  There is a stent just proximal to the arteriovenous anastomosis which is patent without significant stenosis.  There is however a turn in the vein which is associated with a greater than 70% stenosis.  This is in the mid upper arm  Intervention: After the above images were acquired the decision made to proceed with intervention.  I tried to get a wire through the area of stenosis from the initial cannulation site in the upper arm however this was unsuccessful and so I cannulated the brachial artery under ultrasound guidance and placed a 5 French slender sheath.  I gave 3000 units of heparin  through the sheath.  I then advanced a Glidewire across the lesion and used a 660 Mustang balloon to perform balloon venoplasty.  Subsequent imaging revealed improved but suboptimal result and so I reinserted the balloon and repeated the balloon venoplasty at high atmospheric pressure for 1 minute.  Completion imaging showed inline flow with no  residual stenosis greater than 20%.  The sheath in the basilic vein was removed and closed with a Monocryl.  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 Gareld June, M.D., [facility] [place] Office: [phone] Pager:  [phone]   DG Abd Portable 1V Result Date: 09/06/2023 CLINICAL DATA:  NG tube placement. EXAM: PORTABLE ABDOMEN - 1 VIEW COMPARISON:  Radiograph earlier today FINDINGS: Tip and side port of the enteric tube below the diaphragm in the stomach. Diffuse gaseous distention of bowel loops in the abdomen, similar to earlier today. IMPRESSION: Tip and side port of the enteric tube below the diaphragm in the stomach. Electronically Signed   By: Chadwick Colonel M.D.   On: 09/06/2023 18:46    Medications: Infusions:  sodium chloride      piperacillin -tazobactam (ZOSYN )  IV 2.25 g (09/08/23 0613)    Scheduled Medications:  Chlorhexidine  Gluconate Cloth  6 each Topical Daily   Chlorhexidine  Gluconate Cloth  6 each Topical Q0600   feeding supplement  1 Container Oral TID BM   fluticasone  furoate-vilanterol  1 puff Inhalation Daily   sevelamer  carbonate  0.8 g Oral TID WC   sodium chloride  flush  3 mL Intravenous Q12H    have reviewed scheduled and prn medications.  Physical Exam: General:NAD, comfortable Heart:RRR, s1s2 nl Lungs:clear b/l, no crackle Abdomen:soft, drain present Extremities:No edema Neurology: Alert, awake and following commands Vascular Access: AV fistula is quite deep, thrill positive.  L[place] Jacquez Prasad Onesty Clair 09/08/2023,10:25 AM  LOS: 8 days

## 2023-09-09 ENCOUNTER — Inpatient Hospital Stay (HOSPITAL_COMMUNITY)

## 2023-09-09 DIAGNOSIS — I159 Secondary hypertension, unspecified: Secondary | ICD-10-CM | POA: Diagnosis not present

## 2023-09-09 DIAGNOSIS — N179 Acute kidney failure, unspecified: Secondary | ICD-10-CM | POA: Diagnosis not present

## 2023-09-09 DIAGNOSIS — D62 Acute posthemorrhagic anemia: Secondary | ICD-10-CM | POA: Diagnosis not present

## 2023-09-09 DIAGNOSIS — K37 Unspecified appendicitis: Secondary | ICD-10-CM | POA: Diagnosis not present

## 2023-09-09 LAB — GLUCOSE, CAPILLARY
Glucose-Capillary: 176 mg/dL — ABNORMAL HIGH (ref 70–99)
Glucose-Capillary: 178 mg/dL — ABNORMAL HIGH (ref 70–99)
Glucose-Capillary: 185 mg/dL — ABNORMAL HIGH (ref 70–99)
Glucose-Capillary: 202 mg/dL — ABNORMAL HIGH (ref 70–99)
Glucose-Capillary: 221 mg/dL — ABNORMAL HIGH (ref 70–99)
Glucose-Capillary: 246 mg/dL — ABNORMAL HIGH (ref 70–99)

## 2023-09-09 LAB — COMPREHENSIVE METABOLIC PANEL WITH GFR
ALT: 21 U/L (ref 0–44)
AST: 29 U/L (ref 15–41)
Albumin: 1.7 g/dL — ABNORMAL LOW (ref 3.5–5.0)
Alkaline Phosphatase: 65 U/L (ref 38–126)
Anion gap: 14 (ref 5–15)
BUN: 90 mg/dL — ABNORMAL HIGH (ref 8–23)
CO2: 27 mmol/L (ref 22–32)
Calcium: 8.1 mg/dL — ABNORMAL LOW (ref 8.9–10.3)
Chloride: 98 mmol/L (ref 98–111)
Creatinine, Ser: 6.75 mg/dL — ABNORMAL HIGH (ref 0.61–1.24)
GFR, Estimated: 8 mL/min — ABNORMAL LOW (ref >60)
Glucose, Bld: 198 mg/dL — ABNORMAL HIGH (ref 70–99)
Potassium: 3.4 mmol/L — ABNORMAL LOW (ref 3.5–5.1)
Sodium: 139 mmol/L (ref 135–145)
Total Bilirubin: 1 mg/dL (ref 0.0–1.2)
Total Protein: 6 g/dL — ABNORMAL LOW (ref 6.5–8.1)

## 2023-09-09 LAB — TRIGLYCERIDES: Triglycerides: 137 mg/dL (ref <150)

## 2023-09-09 LAB — CBC
HCT: 25.9 % — ABNORMAL LOW (ref 39.0–52.0)
Hemoglobin: 8.3 g/dL — ABNORMAL LOW (ref 13.0–17.0)
MCH: 32 pg (ref 26.0–34.0)
MCHC: 32 g/dL (ref 30.0–36.0)
MCV: 100 fL (ref 80.0–100.0)
Platelets: 402 10*3/uL — ABNORMAL HIGH (ref 150–400)
RBC: 2.59 MIL/uL — ABNORMAL LOW (ref 4.22–5.81)
RDW: 16.1 % — ABNORMAL HIGH (ref 11.5–15.5)
WBC: 15.1 10*3/uL — ABNORMAL HIGH (ref 4.0–10.5)
nRBC: 0 % (ref 0.0–0.2)

## 2023-09-09 LAB — PHOSPHORUS: Phosphorus: 6.9 mg/dL — ABNORMAL HIGH (ref 2.5–4.6)

## 2023-09-09 LAB — PARATHYROID HORMONE, INTACT (NO CA): PTH: 337 pg/mL — ABNORMAL HIGH (ref 15–65)

## 2023-09-09 LAB — MAGNESIUM: Magnesium: 2.4 mg/dL (ref 1.7–2.4)

## 2023-09-09 MED ORDER — ANTICOAGULANT SODIUM CITRATE 4% (200MG/5ML) IV SOLN: 5.0000 mL | Status: DC | PRN

## 2023-09-09 MED ORDER — HEPARIN SODIUM (PORCINE) 1000 UNIT/ML IJ SOLN
INTRAMUSCULAR | Status: AC
  Filled 2023-09-09: qty 3

## 2023-09-09 MED ORDER — HEPARIN SODIUM (PORCINE) 1000 UNIT/ML DIALYSIS
20.0000 [IU]/kg | INTRAMUSCULAR | Status: DC | PRN
  Administered 2023-09-09: 1900 [IU] via INTRAVENOUS_CENTRAL

## 2023-09-09 MED ORDER — HYDROMORPHONE HCL 1 MG/ML IJ SOLN
INTRAMUSCULAR | Status: AC
  Filled 2023-09-09: qty 0.5

## 2023-09-09 MED ORDER — POTASSIUM CHLORIDE 10 MEQ/100ML IV SOLN
10.0000 meq | INTRAVENOUS | Status: AC
Start: 2023-09-09 — End: 2023-09-09
  Administered 2023-09-09 (×3): 10 meq via INTRAVENOUS
  Filled 2023-09-09 (×3): qty 100

## 2023-09-09 MED ORDER — METOPROLOL TARTRATE 5 MG/5ML IV SOLN
5.0000 mg | Freq: Four times a day (QID) | INTRAVENOUS | Status: DC
  Administered 2023-09-09 – 2023-09-12 (×10): 5 mg via INTRAVENOUS
  Filled 2023-09-09 (×12): qty 5

## 2023-09-09 MED ORDER — CHLORHEXIDINE GLUCONATE CLOTH 2 % EX PADS
6.0000 | MEDICATED_PAD | Freq: Every day | CUTANEOUS | Status: DC
  Administered 2023-09-09 – 2023-09-16 (×7): 6 via TOPICAL

## 2023-09-09 MED ORDER — HEPARIN SODIUM (PORCINE) 1000 UNIT/ML DIALYSIS
1000.0000 [IU] | INTRAMUSCULAR | Status: DC | PRN
  Administered 2023-09-09: 2400 [IU]

## 2023-09-09 MED ORDER — TRACE MINERALS CU-MN-SE-ZN 300-55-60-3000 MCG/ML IV SOLN
INTRAVENOUS | Status: AC
  Filled 2023-09-09: qty 607.2

## 2023-09-09 MED ORDER — METOPROLOL TARTRATE 5 MG/5ML IV SOLN
5.0000 mg | INTRAVENOUS | Status: DC | PRN
Start: 2023-09-09 — End: 2023-09-16
  Administered 2023-09-16: 5 mg via INTRAVENOUS
  Filled 2023-09-09: qty 5

## 2023-09-09 MED ORDER — HEPARIN SODIUM (PORCINE) 5000 UNIT/ML IJ SOLN
5000.0000 [IU] | Freq: Three times a day (TID) | INTRAMUSCULAR | Status: DC
  Administered 2023-09-09 – 2023-10-01 (×63): 5000 [IU] via SUBCUTANEOUS
  Filled 2023-09-09 (×65): qty 1

## 2023-09-09 MED ORDER — ALTEPLASE 2 MG IJ SOLR: 2.0000 mg | Freq: Once | INTRAMUSCULAR | Status: DC | PRN

## 2023-09-09 MED ORDER — HEPARIN SODIUM (PORCINE) 1000 UNIT/ML IJ SOLN
INTRAMUSCULAR | Status: AC
  Filled 2023-09-09: qty 2

## 2023-09-09 NOTE — Progress Notes (Signed)
 2 Days Post-Op   Subjective/Chief Complaint: NG fell out, was replaced, fell out again.  RN working on getting a bridle to the bedside.   No vomiting. No BM yet.  Temp HD line placed yesterday because unable to access LUE fistula for HD.   Objective: Vital signs in last 24 hours: Temp:  [97.5 F (36.4 C)-99.9 F (37.7 C)] 98.2 F (36.8 C) (05/15 0735) Pulse Rate:  [111-124] 117 (05/15 0735) Resp:  [20-28] 22 (05/15 0735) BP: (117-149)/(68-82) 125/72 (05/15 0735) SpO2:  [89 %-100 %] 94 % (05/15 0735) Weight:  [89.8 kg-92.8 kg] 92.8 kg (05/15 0334) Last BM Date : 09/08/23  Intake/Output from previous day: 05/14 0701 - 05/15 0700 In: 335.5 [I.V.:335.5] Out: 740 [Urine:450; Emesis/NG output:250; Drains:40] Intake/Output this shift: No intake/output data recorded.  Central line R internal jugular with blood stained dressing Ab distended approp tender jp w/ bloody output (40 mL)  NG laying in the bed, dark bilious effluent in cannister (400 mL)  Lab Results:  Recent Labs    09/07/23 0323 09/08/23 0024  WBC 13.4* 16.8*  HGB 10.0* 10.9*  HCT 29.2* 32.6*  PLT 299 406*   BMET Recent Labs    09/08/23 0024 09/09/23 0651  NA 140 139  K 4.1 3.4*  CL 96* 98  CO2 19* 27  GLUCOSE 82 198*  BUN 119* 90*  CREATININE 8.87* 6.75*  CALCIUM  8.2* 8.1*   PT/INR No results for input(s): "LABPROT", "INR" in the last 72 hours. ABG No results for input(s): "PHART", "HCO3" in the last 72 hours.  Invalid input(s): "PCO2", "PO2"  Studies/Results: DG Chest Port 1 View Result Date: 09/09/2023 CLINICAL DATA:  Abdominal pain. EXAM: PORTABLE CHEST 1 VIEW COMPARISON:  09/07/2023 FINDINGS: The cardio pericardial silhouette is enlarged. Bibasilar atelectasis/infiltrate again noted with small bilateral pleural effusions. There is pulmonary vascular congestion without overt pulmonary edema. The NG tube passes into the stomach although the distal tip position is not included on the film. Right  IJ central line tip overlies the proximal SVC level. Telemetry leads overlie the chest. IMPRESSION: 1. Bibasilar atelectasis/infiltrate with small bilateral pleural effusions. 2. Pulmonary vascular congestion without overt pulmonary edema. Electronically Signed   By: Donnal Fusi M.D.   On: 09/09/2023 07:25   DG Abd 1 View Result Date: 09/08/2023 CLINICAL DATA:  NG tube placement EXAM: ABDOMEN - 1 VIEW COMPARISON:  09/07/2023 FINDINGS: Enteric tube has been retracted, side-port in the region of the distal esophagus. Multiple dilated loops of small bowel. IMPRESSION: Enteric tube has been retracted, side-port in the region of the distal esophagus. Recommend advancement by about 10 cm for more optimal positioning Electronically Signed   By: Esmeralda Hedge M.D.   On: 09/08/2023 23:47   CT ABDOMEN PELVIS W CONTRAST Result Date: 09/08/2023 CLINICAL DATA:  Abdominal pain. Postop day 8 for laparoscopic appendectomy. EXAM: CT ABDOMEN AND PELVIS WITH CONTRAST TECHNIQUE: Multidetector CT imaging of the abdomen and pelvis was performed using the standard protocol following bolus administration of intravenous contrast. RADIATION DOSE REDUCTION: This exam was performed according to the departmental dose-optimization program which includes automated exposure control, adjustment of the mA and/or kV according to patient size and/or use of iterative reconstruction technique. CONTRAST:  75mL OMNIPAQUE  IOHEXOL  350 MG/ML SOLN COMPARISON:  08/30/2023 and 09/02/2023 FINDINGS: Lower chest: Ventral hernia containing mediastinal and likely upper abdominal adipose tissues extending through a lower sternal defect with abnormal stranding within the herniated adipose tissues and abnormal stranding in the mediastinal adipose tissue anterior  to the pericardium as on image 2 series 3. Moderate right and small left pleural effusions with pleural enhancement and passive atelectasis. Calcified pleural plaques at the lung bases. Rounded  atelectasis in the left lower lobe and scarring in the lingula. Circumflex and right coronary artery atherosclerosis along with descending thoracic aortic atherosclerosis. Hepatobiliary: Dependent density in the gallbladder compatible with either vicarious excretion of contrast (favored) or sludge. No significant hepatic parenchymal lesion identified. No biliary dilatation. Pancreas: Unremarkable Spleen: Hypodense partially exophytic posterior splenic mass measures 1.7 cm in diameter with portal venous phase density 64 Hounsfield units and delayed phase density 75 Hounsfield units, this may be a hemangioma but is technically nonspecific. Adrenals/Urinary Tract: Stable substantial adrenal thickening bilaterally compatible with hyperplasia. Stable benign renal cysts warranting no further imaging workup. Foley catheter noted in the urinary bladder which is otherwise empty. Stomach/Bowel: Nasogastric tube tip extends through the stomach and terminates in the descending duodenum. Scattered sigmoid colon diverticula. A drain is present terminating in the right lower quadrant and the drain terminates within a complex 5.5 by 11.7 by 4.3 cm (volume = 140 cm^3) collection immediately adjacent to a loop of small bowel in the right lower quadrant, and extending up adjacent to the appendectomy site. This complex collection was smaller on the prior exam. The contrast medium in the terminal ileum and cecum is of higher density than the complex fluid in this collection, although strictly speaking a leak containing only a small amount of diluted contrast is not totally excluded. This could alternatively be an abscess but the poor response to the drain is of uncertain significance, correlate with chronic door aspirate from drainage. Is the drain fluid bloody or purulent? Substantially reduced complex fluid in the left paracolic gutter compared to previous. Small amount of complex fluid in the pelvis, favoring hemoperitoneum.  Scattered loops of mildly dilated small bowel up to about 3.5 cm in diameter, potentially from postoperative ileus, correlate with return of bowel function after surgery. There is likely some complex fluid along the right lower quadrant omentum for example on image 68 series 3. Some of the orally administered contrast extends around all the way to the rectum. Small amount of complex fluid adjacent to the sigmoid colon. Vascular/Lymphatic: Atherosclerosis is present, including aortoiliac atherosclerotic disease. Reproductive: Dorsal penoscrotal edema. Other: Previous small amount of free intraperitoneal gas has resolved. Musculoskeletal: Lower sternal defect as noted above. 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 Lumbar spondylosis and degenerative disc disease. Suspected right foraminal impingement at L2-3 and left foraminal impingement at L4-5. IMPRESSION: 1. The drain terminates within a complex 5.5 by 11.7 by 4.3 cm (volume = 140 cc) collection immediately adjacent to a loop of small bowel in the right lower quadrant, and extending up adjacent to the appendectomy site. This collection was smaller on the prior exam. Is the drain fluid bloody or purulent? There are other sites of complex fluid in the pelvis and adjacent to the sigmoid colon possibly from hemoperitoneum, and reduced complex fluid along the left paracolic gutter compared to previous. 2. Scattered loops of mildly dilated small bowel up to about 3.5 cm in diameter, potentially from postoperative ileus, correlate with return of bowel function after surgery. 3. Moderate right and small left pleural effusions with pleural enhancement and passive atelectasis. 4. Ventral hernia containing mediastinal and likely upper abdominal adipose tissues extending through a lower sternal defect with abnormal stranding within the herniated adipose tissues and abnormal stranding in the mediastinal adipose  tissue anterior to the  pericardium. 5. Rounded atelectasis in the left lower lobe and scarring in the lingula. 6. Dependent density in the gallbladder compatible with either vicarious excretion of contrast (favored) or sludge. 7. 1.7 cm hypodense partially exophytic posterior splenic mass, this may be a hemangioma but is technically nonspecific. 8. Lumbar spondylosis and degenerative disc disease with suspected right foraminal impingement at L2-3 and left foraminal impingement at L4-5. 9. Mild dorsal penoscrotal edema. 10.  Aortic Atherosclerosis (ICD10-I70.0). Electronically Signed   By: Freida Jes M.D.   On: 09/08/2023 14:11   IR US  Guide Vasc Access Right Result Date: 09/08/2023 INDICATION: 78 year old male presents for temporary hemodialysis catheter EXAM: IMAGE GUIDED TEMPORARY HEMODIALYSIS CATHETER MEDICATIONS: None ANESTHESIA/SEDATION: None FLUOROSCOPY: Radiation Exposure Index (as provided by the fluoroscopic device): 1.8 mGy Kerma COMPLICATIONS: None PROCEDURE: Informed written consent was obtained from the patient's family after a discussion of the risks, benefits, and alternatives to treatment. Questions regarding the procedure were encouraged and answered. The right neck was prepped with chlorhexidine  in a sterile fashion, and a sterile drape was applied covering the operative field. Maximum barrier sterile technique with sterile gowns and gloves were used for the procedure. A timeout was performed prior to the initiation of the procedure. A micropuncture kit was utilized to access the right internal jugular vein under direct, real-time ultrasound guidance after the overlying soft tissues were anesthetized with 1% lidocaine  with epinephrine . Ultrasound image documentation was performed. The microwire was kinked to measure appropriate catheter length. A stiff glidewire was advanced to the level of the IVC. A 15 cm hemodialysis catheter was then placed over the wire. Final catheter positioning was confirmed and  documented with a spot radiographic image. The catheter aspirates and flushes normally. The catheter was flushed with appropriate volume heparin  dwells. Dressings were applied. The patient tolerated the procedure well without immediate post procedural complication. IMPRESSION: Status post right IJ temporary hemodialysis catheter Signed, Marciano Settles. Rexine Cater, RPVI [facility] Electronically Signed   By: Myrlene Asper D.O.   On: 09/08/2023 13:20   DG CHEST PORT 1 VIEW Result Date: 09/07/2023 CLINICAL DATA:  Respiratory distress EXAM: PORTABLE CHEST 1 VIEW COMPARISON:  04/06/2023, CT chest 12/12/2020, chest x-ray 05/22/2021 FINDINGS: Sternotomy. Stable cardiomediastinal silhouettewhich appears slightly enlarged. There are chronic pleural and parenchymal changes on the left. There is new suspected right pleural effusion. Suspicion of vascular congestion and possible mild edema IMPRESSION: Chronic pleural and parenchymal changes on the left. The heart is enlarged. Suspect mild central congestion and probable new right-sided pleural effusion Electronically Signed   By: Esmeralda Hedge M.D.   On: 09/07/2023 23:54   DG Abd Portable 1V Result Date: 09/07/2023 CLINICAL DATA:  Abdomen pain EXAM: PORTABLE ABDOMEN - 1 VIEW COMPARISON:  09/06/2023 FINDINGS: Enteric tube tip overlies the distal stomach. Mild diffuse gaseous dilatation of large and small bowel. IMPRESSION: Enteric tube tip overlies the distal stomach. Mild diffuse gaseous dilatation of large and small bowel without significant change Electronically Signed   By: Esmeralda Hedge M.D.   On: 09/07/2023 23:50   VAS US  UPPER EXTREMITY ARTERIAL DUPLEX Result Date: 09/07/2023  UPPER EXTREMITY DUPLEX STUDY Patient Name:  Christian [name]  Date of Exam:   09/07/2023 Medical Rec #: [id number]        Accession #:    [id number] Date of Birth: May 29, 1945       Patient Gender: M Patient Age:   22 years Exam  Location:  [facility] Procedure:      VAS US  UPPER EXTREMITY ARTERIAL DUPLEX Referring Phys: EMMA COLLINS --------------------------------------------------------------------------------  Indications: Pain from vascular device, graft or implant.  Risk Factors:  Hypertension, hyperlipidemia, Diabetes. Other Factors: 09/18/2020 - ARTERIOVENOUS (AV) FISTULA CREATION LEFT VERSUS GRAFT Comparison Study: No prior studies. Performing Technologist: Lerry Ransom RVT  Examination Guidelines: A complete evaluation includes B-mode imaging, spectral Doppler, color Doppler, and power Doppler as needed of all accessible portions of each vessel. Bilateral testing is considered an integral part of a complete examination. Limited examinations for reoccurring indications may be performed as noted.  Left Doppler Findings: +--------------+----------+---------+--------+------------------+ Site          PSV (cm/s)Waveform StenosisComments           +--------------+----------+---------+--------+------------------+ Subclavian Mid76        triphasic                           +--------------+----------+---------+--------+------------------+ Axillary      134       triphasic                           +--------------+----------+---------+--------+------------------+ Brachial Prox 85        triphasic                           +--------------+----------+---------+--------+------------------+ Brachial Dist 69        triphasic                           +--------------+----------+---------+--------+------------------+ Radial Prox                              Unable to insonate +--------------+----------+---------+--------+------------------+ Radial Mid                               Unable to insonate +--------------+----------+---------+--------+------------------+ Radial Dist                              Unable to insonate +--------------+----------+---------+--------+------------------+  Ulnar Prox    69        triphasic                           +--------------+----------+---------+--------+------------------+ Ulnar Mid     66        triphasic                           +--------------+----------+---------+--------+------------------+ Ulnar Dist    67        triphasic                           +--------------+----------+---------+--------+------------------+ Flow is detected in the left brachio-basilic fistula.  Summary:  Left: No obstruction visualized in the left upper extremity. *See table(s) above for measurements and observations. Electronically signed by Delaney Fearing on 09/07/2023 at 4:09:38 PM.    Final    VAS US  UPPER EXT VEIN MAPPING (PRE-OP AVF) Result Date: 09/07/2023 UPPER EXTREMITY VEIN MAPPING Patient Name:  Christian [name]  Date of Exam:   09/07/2023 Medical Rec #: [id number]  Accession #:    [id number] Date of Birth: Dec 25, 1945       Patient Gender: M Patient Age:   89 years Exam Location:  [facility] Procedure:      VAS US  UPPER EXT VEIN MAPPING (PRE-OP AVF) Referring Phys: EMMA COLLINS --------------------------------------------------------------------------------  Indications: Pre-access. Comparison Study: No prior studies. Performing Technologist: Lerry Ransom RVT  Examination Guidelines: A complete evaluation includes B-mode imaging, spectral Doppler, color Doppler, and power Doppler as needed of all accessible portions of each vessel. Bilateral testing is considered an integral part of a complete examination. Limited examinations for reoccurring indications may be performed as noted. +-----------------+-------------+----------+--------+ Right Cephalic   Diameter (cm)Depth (cm)Findings +-----------------+-------------+----------+--------+ Shoulder             0.06        0.34            +-----------------+-------------+----------+--------+ Prox upper arm       0.04        0.39             +-----------------+-------------+----------+--------+ Mid upper arm        0.04        0.46            +-----------------+-------------+----------+--------+ Dist upper arm       0.08        0.22            +-----------------+-------------+----------+--------+ Antecubital fossa    0.06        0.33            +-----------------+-------------+----------+--------+ Prox forearm         0.13        0.40            +-----------------+-------------+----------+--------+ Mid forearm          0.19        0.46            +-----------------+-------------+----------+--------+ Dist forearm         0.12        0.37            +-----------------+-------------+----------+--------+ +-----------------+-------------+----------+---------+ Right Basilic    Diameter (cm)Depth (cm)Findings  +-----------------+-------------+----------+---------+ Shoulder             0.46        1.10             +-----------------+-------------+----------+---------+ Prox upper arm       0.34        1.10             +-----------------+-------------+----------+---------+ Mid upper arm        0.32        1.40             +-----------------+-------------+----------+---------+ Dist upper arm       0.33        0.73   branching +-----------------+-------------+----------+---------+ Antecubital fossa    0.22        0.62             +-----------------+-------------+----------+---------+ Prox forearm         0.21        0.40             +-----------------+-------------+----------+---------+ Mid forearm          0.19        0.38             +-----------------+-------------+----------+---------+ Distal forearm       0.19  0.29             +-----------------+-------------+----------+---------+ +-----------------+-------------+----------+--------+ Left Cephalic    Diameter (cm)Depth (cm)Findings +-----------------+-------------+----------+--------+ Shoulder             0.50        0.22             +-----------------+-------------+----------+--------+ Prox upper arm       0.05        0.31            +-----------------+-------------+----------+--------+ Mid upper arm        0.04        0.52            +-----------------+-------------+----------+--------+ Dist upper arm       0.05        0.46            +-----------------+-------------+----------+--------+ Antecubital fossa    0.06        0.41            +-----------------+-------------+----------+--------+ Prox forearm         0.12        0.47            +-----------------+-------------+----------+--------+ Mid forearm          0.00        0.44            +-----------------+-------------+----------+--------+ Dist forearm         0.06        0.36            +-----------------+-------------+----------+--------+ +-----------------+-------------+----------+---------+ Left Basilic     Diameter (cm)Depth (cm)Findings  +-----------------+-------------+----------+---------+ Shoulder             0.84        1.40             +-----------------+-------------+----------+---------+ Prox upper arm       0.38        0.41             +-----------------+-------------+----------+---------+ Mid upper arm                           HD Access +-----------------+-------------+----------+---------+ Dist upper arm                          HD Access +-----------------+-------------+----------+---------+ Antecubital fossa    0.14        0.45             +-----------------+-------------+----------+---------+ Prox forearm         0.05        0.24             +-----------------+-------------+----------+---------+ Mid forearm          0.05        0.22             +-----------------+-------------+----------+---------+ Distal forearm       0.05        0.32             +-----------------+-------------+----------+---------+ *See table(s) above for measurements and observations.  Diagnosing physician:  Delaney Fearing Electronically signed by Delaney Fearing on 09/07/2023 at 4:09:10 PM.    Final    PERIPHERAL VASCULAR CATHETERIZATION Result Date: 09/07/2023 Images from the original result were not included. Patient name: Christian [name] MRN: [id number] DOB: 1946/01/27 Sex: male 09/07/2023 Pre-operative Diagnosis: End-stage renal disease Post-operative diagnosis:  Same Surgeon:  Gareld June Procedure Performed:  1.  Ultrasound-guided access, left basilic vein  2.  Ultrasound-guided access, left brachial artery  3.  Fistulogram  4.  Balloon venoplasty left basilic vein  5.  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  His fistula does not have a good thrill to it and so he comes in today for fistulogram Procedure:  The patient was identified in the holding area and taken to room 8.  The patient was then placed supine on the table and prepped and draped in the usual sterile fashion.  A time out was called.  Conscious sedation was administered with the use of IV fentanyl  and Versed  under continuous physician and nurse monitoring.  Heart rate, blood pressure, and oxygen saturations were continuously monitored.  Total sedation time was 60 minutes ultrasound was used to evaluate the fistula.  The vein was patent and compressible.  A digital ultrasound image was acquired.  The fistula was then accessed under ultrasound guidance using a micropuncture needle.  An 018 wire was then asvanced without resistance and a micropuncture sheath was placed.  Contrast injections were then performed through the sheath. Findings: No central venous stenosis.  The arteriovenous anastomosis is widely patent.  There is a stent just proximal to the arteriovenous anastomosis which is patent without significant stenosis.  There is however a turn in the vein which is associated with a  greater than 70% stenosis.  This is in the mid upper arm  Intervention: After the above images were acquired the decision made to proceed with intervention.  I tried to get a wire through the area of stenosis from the initial cannulation site in the upper arm however this was unsuccessful and so I cannulated the brachial artery under ultrasound guidance and placed a 5 French slender sheath.  I gave 3000 units of heparin  through the sheath.  I then advanced a Glidewire across the lesion and used a 660 Mustang balloon to perform balloon venoplasty.  Subsequent imaging revealed improved but suboptimal result and so I reinserted the balloon and repeated the balloon venoplasty at high atmospheric pressure for 1 minute.  Completion imaging showed inline flow with no residual stenosis greater than 20%.  The sheath in the basilic vein was removed and closed with a Monocryl.  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 Gareld June, M.D., [facility] [place] Office: [phone] Pager:  [phone]    Anti-infectives: Anti-infectives (From admission, onward)    Start     Dose/Rate Route Frequency Ordered Stop   08/31/23 2200  piperacillin -tazobactam (ZOSYN ) IVPB 2.25 g        2.25 g 100 mL/hr over 30 Minutes Intravenous Every 8 hours 08/31/23 1953     08/31/23 0600  piperacillin -tazobactam (ZOSYN ) IVPB 3.375 g  Status:  Discontinued        3.375 g 100 mL/hr over 30 Minutes Intravenous Every 8 hours 08/31/23 0532 08/31/23 1953   08/30/23 2245  piperacillin -tazobactam (ZOSYN ) IVPB 3.375 g        3.375 g 100 mL/hr over 30  Minutes Intravenous  Once 08/30/23 2233 08/31/23 0005        Assessment/Plan: POD 9 - s/p lap appy with JP drain placement for perforated appendicitis [name] 5/6  - persistent ileus, continue NG tube to LIWS. Replace NG tube today and bridle  - CT abd pelvis 5/14 shows JP within a hematoma, post-op ileus with dilated small bowel, bilateral R>L pleural effusions.  - dark sanguinous output from JP drain, Hgb stable yesterday at 10.9 - encourage OOB, ambulation   FEN - NPO, TPN started 5/14 Lines/tubes - temp HD cath R internal jugular, PIV x2 RUE, LUE w/ AV Fistula (non-functional), NGT to be replaced, LLE JP drain Foley - in placed, clear yellow urine VTE - heparin /plavix  on hold ID - Zosyn ; WBC 16 yesterday  Charlott Converse 09/09/2023

## 2023-09-09 NOTE — Progress Notes (Signed)
 OT Cancellation Note  Patient Details Name: TAQUON AMICUCCI MRN: [id number] DOB: 30-Aug-1945   Cancelled Treatment:    Reason Eval/Treat Not Completed: Patient at procedure or test/ unavailable.  At HD.    Evangeline Utley D Juliette Standre 09/09/2023, 2:47 PM 09/09/2023  RP, OTR/L  Acute Rehabilitation Services  Office:  [phone]

## 2023-09-09 NOTE — Plan of Care (Signed)
  Problem: Education: Goal: Ability to describe self-care measures that may prevent or decrease complications (Diabetes Survival Skills Education) will improve Outcome: Not Progressing Goal: Individualized Educational Video(s) Outcome: Not Progressing   Problem: Coping: Goal: Ability to adjust to condition or change in health will improve Outcome: Not Progressing   Problem: Fluid Volume: Goal: Ability to maintain a balanced intake and output will improve Outcome: Not Progressing   Problem: Health Behavior/Discharge Planning: Goal: Ability to identify and utilize available resources and services will improve Outcome: Not Progressing Goal: Ability to manage health-related needs will improve Outcome: Not Progressing   Problem: Metabolic: Goal: Ability to maintain appropriate glucose levels will improve Outcome: Not Progressing   Problem: Nutritional: Goal: Maintenance of adequate nutrition will improve Outcome: Not Progressing Goal: Progress toward achieving an optimal weight will improve Outcome: Not Progressing   Problem: Skin Integrity: Goal: Risk for impaired skin integrity will decrease Outcome: Not Progressing   Problem: Tissue Perfusion: Goal: Adequacy of tissue perfusion will improve Outcome: Not Progressing   Problem: Education: Goal: Knowledge of General Education information will improve Description: Including pain rating scale, medication(s)/side effects and non-pharmacologic comfort measures Outcome: Not Progressing   Problem: Health Behavior/Discharge Planning: Goal: Ability to manage health-related needs will improve Outcome: Not Progressing   Problem: Clinical Measurements: Goal: Ability to maintain clinical measurements within normal limits will improve Outcome: Not Progressing Goal: Will remain free from infection Outcome: Not Progressing Goal: Diagnostic test results will improve Outcome: Not Progressing Goal: Respiratory complications will  improve Outcome: Not Progressing Goal: Cardiovascular complication will be avoided Outcome: Not Progressing   Problem: Activity: Goal: Risk for activity intolerance will decrease Outcome: Not Progressing   Problem: Nutrition: Goal: Adequate nutrition will be maintained Outcome: Not Progressing   Problem: Coping: Goal: Level of anxiety will decrease Outcome: Not Progressing   Problem: Elimination: Goal: Will not experience complications related to bowel motility Outcome: Not Progressing Goal: Will not experience complications related to urinary retention Outcome: Not Progressing   Problem: Pain Managment: Goal: General experience of comfort will improve and/or be controlled Outcome: Not Progressing   Problem: Safety: Goal: Ability to remain free from injury will improve Outcome: Not Progressing   Problem: Skin Integrity: Goal: Risk for impaired skin integrity will decrease Outcome: Not Progressing   Problem: Education: Goal: Knowledge of disease and its progression will improve Outcome: Not Progressing Goal: Individualized Educational Video(s) Outcome: Not Progressing   Problem: Fluid Volume: Goal: Compliance with measures to maintain balanced fluid volume will improve Outcome: Not Progressing   Problem: Health Behavior/Discharge Planning: Goal: Ability to manage health-related needs will improve Outcome: Not Progressing   Problem: Nutritional: Goal: Ability to make healthy dietary choices will improve Outcome: Not Progressing   Problem: Clinical Measurements: Goal: Complications related to the disease process, condition or treatment will be avoided or minimized Outcome: Not Progressing   Problem: Education: Goal: Understanding of CV disease, CV risk reduction, and recovery process will improve Outcome: Not Progressing Goal: Individualized Educational Video(s) Outcome: Not Progressing   Problem: Activity: Goal: Ability to return to baseline activity  level will improve Outcome: Not Progressing   Problem: Cardiovascular: Goal: Ability to achieve and maintain adequate cardiovascular perfusion will improve Outcome: Not Progressing Goal: Vascular access site(s) Level 0-1 will be maintained Outcome: Not Progressing   Problem: Health Behavior/Discharge Planning: Goal: Ability to safely manage health-related needs after discharge will improve Outcome: Not Progressing

## 2023-09-09 NOTE — Progress Notes (Signed)
 [facility]                                                                               Christian [name], is a 78 y.o. male, DOB - [birth year], LKG:[id number] Admit date - 08/30/2023    Outpatient Primary MD for the patient is Christian Homestead, MD  LOS - 9  days    Brief summary    78 y.o. yo male with CAD status post CABG, postoperative CVA and renal failure , underwent laparoscopic appendectomy for perforated appendicitis by Dr Aniceto Barley on 5/6 developed postop ileus, AKI on CKD stage 4, currently on HD.   Assessment & Plan    Assessment and Plan:  Appendicitis with possible periappendiceal abscess Post op ileus - s/p lap appendectomy on 5/6 - continue zosyn  as per general surgery. Day 10 of IV zosyn .  - drain remains in place with dark sanguinous output .  - Poor intake and no sign of ileus improving.  Surgery recommending TPN which was started on 5/14. - patient underwent Placement of a right IJ approach triple lumen temp HD cath  on 5/14 as putting in a tunneled HD catheter at this time with leukocytosis might be risky and more prone to infection.  - continue NGT for decompression (it was removed by the patient last night, it was replaced again this am and confirmation abd x ray ordered). - gen surgery, IR, vascular surgery on board.  - CT abdomen and pelvis shows complex 5.5 by 11.7 by 4.3 cm collection adjacent to the appendectomy site. - continue with TPN.    Acute on CKDIV ATN - patient has history of CKD4. Baseline creat ~ 3.2 - 3.5, eGFR~ 17 - he has required HD the past - possibly volume depletion from surgery/blood loss and when he had severe hypoTN on 5/7; I don't think due to zosyn  but AIN always a possibility as well  - seems to be ATN with uptrending creat/BUN; has been slowly approaching need for dialysis - Nephrology has been following also - Vascular surgery consulted; appreciate assistance.  Fistulogram performed 09/07/2023 and balloon  venoplasty performed and left arm fistula ready for use.   Unfortunately left arm fistula cannot be cannulated at this time.     ABLA Hemoglobin around 10 , repeat H&H in am.    HTN Optimal.    Type 2 DM; well controlled CBG'S CBG (last 3)  Recent Labs    09/09/23 0024 09/09/23 0338 09/09/23 0732  GLUCAP 176* 202* 185*   Resume SSI.    CAD - s/p CABG - cleared by cardiology prior to surgery  Thrombocytosis Monitor.         Malnutrition Type:  Nutrition Problem: Inadequate oral intake Etiology: inability to eat   Malnutrition Characteristics:  Signs/Symptoms: NPO status   Nutrition Interventions:  Interventions: TPN  Estimated body mass index is 30.21 kg/m as calculated from the following:   Height as of this encounter: 5\' 9"  (1.753 m).   Weight as of this encounter: 92.8 kg.  Code Status: full code.  DVT Prophylaxis:  Place and maintain sequential compression device Start: 09/01/23 2001 SCDs Start: 08/31/23 0415   Level of  Care: Level of care: Progressive Family Communication: None at bedside.   Disposition Plan:     Remains inpatient appropriate:  Pending clinical improvement.    Procedures:  Lap appendectomy.   Consultants:   General surgery Nephrology Vascular surgery.  IR.   Antimicrobials:   Anti-infectives (From admission, onward)    Start     Dose/Rate Route Frequency Ordered Stop   08/31/23 2200  piperacillin -tazobactam (ZOSYN ) IVPB 2.25 g        2.25 g 100 mL/hr over 30 Minutes Intravenous Every 8 hours 08/31/23 1953     08/31/23 0600  piperacillin -tazobactam (ZOSYN ) IVPB 3.375 g  Status:  Discontinued        3.375 g 100 mL/hr over 30 Minutes Intravenous Every 8 hours 08/31/23 0532 08/31/23 1953   08/30/23 2245  piperacillin -tazobactam (ZOSYN ) IVPB 3.375 g        3.375 g 100 mL/hr over 30 Minutes Intravenous  Once 08/30/23 2233 08/31/23 0005        Medications  Scheduled Meds:  Chlorhexidine  Gluconate Cloth  6 each  Topical Q0600   fluticasone  furoate-vilanterol  1 puff Inhalation Daily   insulin  aspart  0-9 Units Subcutaneous Q4H   metoprolol  tartrate  5 mg Intravenous Q6H   sevelamer  carbonate  0.8 g Oral TID WC   sodium chloride  flush  3 mL Intravenous Q12H   Continuous Infusions:  piperacillin -tazobactam (ZOSYN )  IV 2.25 g (09/09/23 0615)   potassium chloride  10 mEq (09/09/23 0847)   TPN ADULT (ION) 35 mL/hr at 09/08/23 2025   TPN ADULT (ION)     PRN Meds:.albuterol , dextrose , HYDROmorphone  (DILAUDID ) injection, LORazepam , methocarbamol  (ROBAXIN ) injection, metoprolol  tartrate, naLOXone (NARCAN)  injection, simethicone , sodium chloride  flush, traMADol     Subjective:   Christian [name] was seen and examined today.  Sleepy, no new complaints. Removed the NGT last night.    Objective:   Vitals:   09/09/23 0333 09/09/23 0334 09/09/23 0735 09/09/23 0850  BP: 117/68  125/72 121/71  Pulse: (!) 121  (!) 117   Resp:   (!) 22   Temp: 99.9 F (37.7 C)  98.2 F (36.8 C)   TempSrc: Axillary  Oral   SpO2: 96%  94%   Weight:  92.8 kg    Height:        Intake/Output Summary (Last 24 hours) at 09/09/2023 0852 Last data filed at 09/09/2023 0600 Gross per 24 hour  Intake 335.46 ml  Output 740 ml  Net -404.54 ml   Filed Weights   09/08/23 1525 09/08/23 1752 09/09/23 0334  Weight: (S) 90.1 kg (S) 89.8 kg 92.8 kg     Exam General exam: Appears calm and comfortable  Respiratory system: Clear to auscultation. Respiratory effort normal. Cardiovascular system: S1 & S2 heard, RRR. No JVD, Gastrointestinal system: Abdomen is soft, tender and distended, JP drain in place. NGT  in place.  Central nervous system: Alert and oriented.  Extremities: Symmetric 5 x 5 power. Skin: No rashes,  Psychiatry: Mood & affect appropriate.     Data Reviewed:  I have personally reviewed following labs and imaging studies   CBC Lab Results  Component Value Date   WBC 16.8 (H) 09/08/2023   RBC 3.37 (L)  09/08/2023   HGB 10.9 (L) 09/08/2023   HCT 32.6 (L) 09/08/2023   MCV 96.7 09/08/2023   MCH 32.3 09/08/2023   PLT 406 (H) 09/08/2023   MCHC 33.4 09/08/2023   RDW 16.4 (H) 09/08/2023   LYMPHSABS 0.5 (L) 09/07/2023  MONOABS 0.7 09/07/2023   EOSABS 0.1 09/07/2023   BASOSABS 0.1 09/07/2023     Last metabolic panel Lab Results  Component Value Date   NA 139 09/09/2023   K 3.4 (L) 09/09/2023   CL 98 09/09/2023   CO2 27 09/09/2023   BUN 90 (H) 09/09/2023   CREATININE 6.75 (H) 09/09/2023   GLUCOSE 198 (H) 09/09/2023   GFRNONAA 8 (L) 09/09/2023   GFRAA 19 (L) 10/18/2019   CALCIUM  8.1 (L) 09/09/2023   PHOS 6.9 (H) 09/09/2023   PROT 6.0 (L) 09/09/2023   ALBUMIN  1.7 (L) 09/09/2023   LABGLOB 2.4 05/22/2021   AGRATIO 1.9 05/22/2021   BILITOT 1.0 09/09/2023   ALKPHOS 65 09/09/2023   AST 29 09/09/2023   ALT 21 09/09/2023   ANIONGAP 14 09/09/2023    CBG (last 3)  Recent Labs    09/09/23 0024 09/09/23 0338 09/09/23 0732  GLUCAP 176* 202* 185*      Coagulation Profile: No results for input(s): "INR", "PROTIME" in the last 168 hours.   Radiology Studies: DG Chest Port 1 View Result Date: 09/09/2023 CLINICAL DATA:  Abdominal pain. EXAM: PORTABLE CHEST 1 VIEW COMPARISON:  09/07/2023 FINDINGS: The cardio pericardial silhouette is enlarged. Bibasilar atelectasis/infiltrate again noted with small bilateral pleural effusions. There is pulmonary vascular congestion without overt pulmonary edema. The NG tube passes into the stomach although the distal tip position is not included on the film. Right IJ central line tip overlies the proximal SVC level. Telemetry leads overlie the chest. IMPRESSION: 1. Bibasilar atelectasis/infiltrate with small bilateral pleural effusions. 2. Pulmonary vascular congestion without overt pulmonary edema. Electronically Signed   By: Donnal Fusi M.D.   On: 09/09/2023 07:25   DG Abd 1 View Result Date: 09/08/2023 CLINICAL DATA:  NG tube placement EXAM:  ABDOMEN - 1 VIEW COMPARISON:  09/07/2023 FINDINGS: Enteric tube has been retracted, side-port in the region of the distal esophagus. Multiple dilated loops of small bowel. IMPRESSION: Enteric tube has been retracted, side-port in the region of the distal esophagus. Recommend advancement by about 10 cm for more optimal positioning Electronically Signed   By: Esmeralda Hedge M.D.   On: 09/08/2023 23:47   CT ABDOMEN PELVIS W CONTRAST Result Date: 09/08/2023 CLINICAL DATA:  Abdominal pain. Postop day 8 for laparoscopic appendectomy. EXAM: CT ABDOMEN AND PELVIS WITH CONTRAST TECHNIQUE: Multidetector CT imaging of the abdomen and pelvis was performed using the standard protocol following bolus administration of intravenous contrast. RADIATION DOSE REDUCTION: This exam was performed according to the departmental dose-optimization program which includes automated exposure control, adjustment of the mA and/or kV according to patient size and/or use of iterative reconstruction technique. CONTRAST:  75mL OMNIPAQUE  IOHEXOL  350 MG/ML SOLN COMPARISON:  08/30/2023 and 09/02/2023 FINDINGS: Lower chest: Ventral hernia containing mediastinal and likely upper abdominal adipose tissues extending through a lower sternal defect with abnormal stranding within the herniated adipose tissues and abnormal stranding in the mediastinal adipose tissue anterior to the pericardium as on image 2 series 3. Moderate right and small left pleural effusions with pleural enhancement and passive atelectasis. Calcified pleural plaques at the lung bases. Rounded atelectasis in the left lower lobe and scarring in the lingula. Circumflex and right coronary artery atherosclerosis along with descending thoracic aortic atherosclerosis. Hepatobiliary: Dependent density in the gallbladder compatible with either vicarious excretion of contrast (favored) or sludge. No significant hepatic parenchymal lesion identified. No biliary dilatation. Pancreas: Unremarkable  Spleen: Hypodense partially exophytic posterior splenic mass measures 1.7 cm in diameter with portal  venous phase density 64 Hounsfield units and delayed phase density 75 Hounsfield units, this may be a hemangioma but is technically nonspecific. Adrenals/Urinary Tract: Stable substantial adrenal thickening bilaterally compatible with hyperplasia. Stable benign renal cysts warranting no further imaging workup. Foley catheter noted in the urinary bladder which is otherwise empty. Stomach/Bowel: Nasogastric tube tip extends through the stomach and terminates in the descending duodenum. Scattered sigmoid colon diverticula. A drain is present terminating in the right lower quadrant and the drain terminates within a complex 5.5 by 11.7 by 4.3 cm (volume = 140 cm^3) collection immediately adjacent to a loop of small bowel in the right lower quadrant, and extending up adjacent to the appendectomy site. This complex collection was smaller on the prior exam. The contrast medium in the terminal ileum and cecum is of higher density than the complex fluid in this collection, although strictly speaking a leak containing only a small amount of diluted contrast is not totally excluded. This could alternatively be an abscess but the poor response to the drain is of uncertain significance, correlate with chronic door aspirate from drainage. Is the drain fluid bloody or purulent? Substantially reduced complex fluid in the left paracolic gutter compared to previous. Small amount of complex fluid in the pelvis, favoring hemoperitoneum. Scattered loops of mildly dilated small bowel up to about 3.5 cm in diameter, potentially from postoperative ileus, correlate with return of bowel function after surgery. There is likely some complex fluid along the right lower quadrant omentum for example on image 68 series 3. Some of the orally administered contrast extends around all the way to the rectum. Small amount of complex fluid adjacent to the  sigmoid colon. Vascular/Lymphatic: Atherosclerosis is present, including aortoiliac atherosclerotic disease. Reproductive: Dorsal penoscrotal edema. Other: Previous small amount of free intraperitoneal gas has resolved. Musculoskeletal: Lower sternal defect as noted above. 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 Lumbar spondylosis and degenerative disc disease. Suspected right foraminal impingement at L2-3 and left foraminal impingement at L4-5. IMPRESSION: 1. The drain terminates within a complex 5.5 by 11.7 by 4.3 cm (volume = 140 cc) collection immediately adjacent to a loop of small bowel in the right lower quadrant, and extending up adjacent to the appendectomy site. This collection was smaller on the prior exam. Is the drain fluid bloody or purulent? There are other sites of complex fluid in the pelvis and adjacent to the sigmoid colon possibly from hemoperitoneum, and reduced complex fluid along the left paracolic gutter compared to previous. 2. Scattered loops of mildly dilated small bowel up to about 3.5 cm in diameter, potentially from postoperative ileus, correlate with return of bowel function after surgery. 3. Moderate right and small left pleural effusions with pleural enhancement and passive atelectasis. 4. Ventral hernia containing mediastinal and likely upper abdominal adipose tissues extending through a lower sternal defect with abnormal stranding within the herniated adipose tissues and abnormal stranding in the mediastinal adipose tissue anterior to the pericardium. 5. Rounded atelectasis in the left lower lobe and scarring in the lingula. 6. Dependent density in the gallbladder compatible with either vicarious excretion of contrast (favored) or sludge. 7. 1.7 cm hypodense partially exophytic posterior splenic mass, this may be a hemangioma but is technically nonspecific. 8. Lumbar spondylosis and degenerative disc disease with suspected right foraminal  impingement at L2-3 and left foraminal impingement at L4-5. 9. Mild dorsal penoscrotal edema. 10.  Aortic Atherosclerosis (ICD10-I70.0). Electronically Signed   By: Freida Jes M.D.   On:  09/08/2023 14:11   IR US  Guide Vasc Access Right Result Date: 09/08/2023 INDICATION: 78 year old male presents for temporary hemodialysis catheter EXAM: IMAGE GUIDED TEMPORARY HEMODIALYSIS CATHETER MEDICATIONS: None ANESTHESIA/SEDATION: None FLUOROSCOPY: Radiation Exposure Index (as provided by the fluoroscopic device): 1.8 mGy Kerma COMPLICATIONS: None PROCEDURE: Informed written consent was obtained from the patient's family after a discussion of the risks, benefits, and alternatives to treatment. Questions regarding the procedure were encouraged and answered. The right neck was prepped with chlorhexidine  in a sterile fashion, and a sterile drape was applied covering the operative field. Maximum barrier sterile technique with sterile gowns and gloves were used for the procedure. A timeout was performed prior to the initiation of the procedure. A micropuncture kit was utilized to access the right internal jugular vein under direct, real-time ultrasound guidance after the overlying soft tissues were anesthetized with 1% lidocaine  with epinephrine . Ultrasound image documentation was performed. The microwire was kinked to measure appropriate catheter length. A stiff glidewire was advanced to the level of the IVC. A 15 cm hemodialysis catheter was then placed over the wire. Final catheter positioning was confirmed and documented with a spot radiographic image. The catheter aspirates and flushes normally. The catheter was flushed with appropriate volume heparin  dwells. Dressings were applied. The patient tolerated the procedure well without immediate post procedural complication. IMPRESSION: Status post right IJ temporary hemodialysis catheter Signed, Marciano Settles. Rexine Cater, RPVI [facility] Radiology Electronically Signed   By: Myrlene Asper D.O.   On: 09/08/2023 13:20   DG CHEST PORT 1 VIEW Result Date: 09/07/2023 CLINICAL DATA:  Respiratory distress EXAM: PORTABLE CHEST 1 VIEW COMPARISON:  04/06/2023, CT chest 12/12/2020, chest x-ray 05/22/2021 FINDINGS: Sternotomy. Stable cardiomediastinal silhouettewhich appears slightly enlarged. There are chronic pleural and parenchymal changes on the left. There is new suspected right pleural effusion. Suspicion of vascular congestion and possible mild edema IMPRESSION: Chronic pleural and parenchymal changes on the left. The heart is enlarged. Suspect mild central congestion and probable new right-sided pleural effusion Electronically Signed   By: Esmeralda Hedge M.D.   On: 09/07/2023 23:54   DG Abd Portable 1V Result Date: 09/07/2023 CLINICAL DATA:  Abdomen pain EXAM: PORTABLE ABDOMEN - 1 VIEW COMPARISON:  09/06/2023 FINDINGS: Enteric tube tip overlies the distal stomach. Mild diffuse gaseous dilatation of large and small bowel. IMPRESSION: Enteric tube tip overlies the distal stomach. Mild diffuse gaseous dilatation of large and small bowel without significant change Electronically Signed   By: Esmeralda Hedge M.D.   On: 09/07/2023 23:50   VAS US  UPPER EXTREMITY ARTERIAL DUPLEX Result Date: 09/07/2023  UPPER EXTREMITY DUPLEX STUDY Patient Name:  KOLLYN KOVANDA  Date of Exam:   09/07/2023 Medical Rec #: [id number]        Accession #:    [id number] Date of Birth: [birth year]       Patient Gender: M Patient Age:   25 years Exam Location:  [facility] Procedure:      VAS US  UPPER EXTREMITY ARTERIAL DUPLEX Referring Phys: EMMA COLLINS --------------------------------------------------------------------------------  Indications: Pain from vascular device, graft or implant.  Risk Factors:  Hypertension, hyperlipidemia, Diabetes. Other Factors: 09/18/2020 - ARTERIOVENOUS (AV) FISTULA CREATION LEFT VERSUS GRAFT Comparison Study: No  prior studies. Performing Technologist: Lerry Ransom RVT  Examination Guidelines: A complete evaluation includes B-mode imaging, spectral Doppler, color Doppler, and power Doppler as needed of all accessible portions of each vessel. Bilateral testing is considered an integral part of a complete examination. Limited  examinations for reoccurring indications may be performed as noted.  Left Doppler Findings: +--------------+----------+---------+--------+------------------+ Site          PSV (cm/s)Waveform StenosisComments           +--------------+----------+---------+--------+------------------+ Subclavian Mid76        triphasic                           +--------------+----------+---------+--------+------------------+ Axillary      134       triphasic                           +--------------+----------+---------+--------+------------------+ Brachial Prox 85        triphasic                           +--------------+----------+---------+--------+------------------+ Brachial Dist 69        triphasic                           +--------------+----------+---------+--------+------------------+ Radial Prox                              Unable to insonate +--------------+----------+---------+--------+------------------+ Radial Mid                               Unable to insonate +--------------+----------+---------+--------+------------------+ Radial Dist                              Unable to insonate +--------------+----------+---------+--------+------------------+ Ulnar Prox    69        triphasic                           +--------------+----------+---------+--------+------------------+ Ulnar Mid     66        triphasic                           +--------------+----------+---------+--------+------------------+ Ulnar Dist    67        triphasic                           +--------------+----------+---------+--------+------------------+ Flow is detected in  the left brachio-basilic fistula.  Summary:  Left: No obstruction visualized in the left upper extremity. *See table(s) above for measurements and observations. Electronically signed by Delaney Fearing on 09/07/2023 at 4:09:38 PM.    Final    VAS US  UPPER EXT VEIN MAPPING (PRE-OP AVF) Result Date: 09/07/2023 UPPER EXTREMITY VEIN MAPPING Patient Name:  CODY SAFFOLD  Date of Exam:   09/07/2023 Medical Rec #: [id number]        Accession #:    [id number] Date of Birth: 1946-04-02       Patient Gender: M Patient Age:   44 years Exam Location:  [facility] Procedure:      VAS US  UPPER EXT VEIN MAPPING (PRE-OP AVF) Referring Phys: EMMA COLLINS --------------------------------------------------------------------------------  Indications: Pre-access. Comparison Study: No prior studies. Performing Technologist: Lerry Ransom RVT  Examination Guidelines: A complete evaluation includes B-mode imaging, spectral Doppler, color Doppler, and power Doppler as needed of all accessible portions of each vessel. Bilateral testing is considered an integral  part of a complete examination. Limited examinations for reoccurring indications may be performed as noted. +-----------------+-------------+----------+--------+ Right Cephalic   Diameter (cm)Depth (cm)Findings +-----------------+-------------+----------+--------+ Shoulder             0.06        0.34            +-----------------+-------------+----------+--------+ Prox upper arm       0.04        0.39            +-----------------+-------------+----------+--------+ Mid upper arm        0.04        0.46            +-----------------+-------------+----------+--------+ Dist upper arm       0.08        0.22            +-----------------+-------------+----------+--------+ Antecubital fossa    0.06        0.33            +-----------------+-------------+----------+--------+ Prox forearm         0.13        0.40             +-----------------+-------------+----------+--------+ Mid forearm          0.19        0.46            +-----------------+-------------+----------+--------+ Dist forearm         0.12        0.37            +-----------------+-------------+----------+--------+ +-----------------+-------------+----------+---------+ Right Basilic    Diameter (cm)Depth (cm)Findings  +-----------------+-------------+----------+---------+ Shoulder             0.46        1.10             +-----------------+-------------+----------+---------+ Prox upper arm       0.34        1.10             +-----------------+-------------+----------+---------+ Mid upper arm        0.32        1.40             +-----------------+-------------+----------+---------+ Dist upper arm       0.33        0.73   branching +-----------------+-------------+----------+---------+ Antecubital fossa    0.22        0.62             +-----------------+-------------+----------+---------+ Prox forearm         0.21        0.40             +-----------------+-------------+----------+---------+ Mid forearm          0.19        0.38             +-----------------+-------------+----------+---------+ Distal forearm       0.19        0.29             +-----------------+-------------+----------+---------+ +-----------------+-------------+----------+--------+ Left Cephalic    Diameter (cm)Depth (cm)Findings +-----------------+-------------+----------+--------+ Shoulder             0.50        0.22            +-----------------+-------------+----------+--------+ Prox upper arm       0.05        0.31            +-----------------+-------------+----------+--------+ Mid upper arm  0.04        0.52            +-----------------+-------------+----------+--------+ Dist upper arm       0.05        0.46            +-----------------+-------------+----------+--------+ Antecubital fossa    0.06        0.41             +-----------------+-------------+----------+--------+ Prox forearm         0.12        0.47            +-----------------+-------------+----------+--------+ Mid forearm          0.00        0.44            +-----------------+-------------+----------+--------+ Dist forearm         0.06        0.36            +-----------------+-------------+----------+--------+ +-----------------+-------------+----------+---------+ Left Basilic     Diameter (cm)Depth (cm)Findings  +-----------------+-------------+----------+---------+ Shoulder             0.84        1.40             +-----------------+-------------+----------+---------+ Prox upper arm       0.38        0.41             +-----------------+-------------+----------+---------+ Mid upper arm                           HD Access +-----------------+-------------+----------+---------+ Dist upper arm                          HD Access +-----------------+-------------+----------+---------+ Antecubital fossa    0.14        0.45             +-----------------+-------------+----------+---------+ Prox forearm         0.05        0.24             +-----------------+-------------+----------+---------+ Mid forearm          0.05        0.22             +-----------------+-------------+----------+---------+ Distal forearm       0.05        0.32             +-----------------+-------------+----------+---------+ *See table(s) above for measurements and observations.  Diagnosing physician: Delaney Fearing Electronically signed by Delaney Fearing on 09/07/2023 at 4:09:10 PM.    Final    PERIPHERAL VASCULAR CATHETERIZATION Result Date: 09/07/2023 Images from the original result were not included. Patient name: BOSCO BRATHWAITE MRN: [id number] DOB: 06-05-1945 Sex: male 09/07/2023 Pre-operative Diagnosis: End-stage renal disease Post-operative diagnosis:  Same Surgeon:  Gareld June Procedure Performed:  1.   Ultrasound-guided access, left basilic vein  2.  Ultrasound-guided access, left brachial artery  3.  Fistulogram  4.  Balloon venoplasty left basilic vein  5.  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  His fistula does not have a good thrill to it and so he comes in today for fistulogram Procedure:  The patient was identified in the holding area and taken to room 8.  The patient was then placed supine on the table and prepped and draped in the usual sterile fashion.  A time out was called.  Conscious sedation was administered with the use of IV fentanyl  and Versed  under continuous physician and nurse monitoring.  Heart rate, blood pressure, and oxygen saturations were continuously monitored.  Total sedation time was 60 minutes ultrasound was used to evaluate the fistula.  The vein was patent and compressible.  A digital ultrasound image was acquired.  The fistula was then accessed under ultrasound guidance using a micropuncture needle.  An 018 wire was then asvanced without resistance and a micropuncture sheath was placed.  Contrast injections were then performed through the sheath. Findings: No central venous stenosis.  The arteriovenous anastomosis is widely patent.  There is a stent just proximal to the arteriovenous anastomosis which is patent without significant stenosis.  There is however a turn in the vein which is associated with a greater than 70% stenosis.  This is in the mid upper arm  Intervention: After the above images were acquired the decision made to proceed with intervention.  I tried to get a wire through the area of stenosis from the initial cannulation site in the upper arm however this was unsuccessful and so I cannulated the brachial artery under ultrasound guidance and placed a 5 French slender sheath.  I gave 3000 units of heparin   through the sheath.  I then advanced a Glidewire across the lesion and used a 660 Mustang balloon to perform balloon venoplasty.  Subsequent imaging revealed improved but suboptimal result and so I reinserted the balloon and repeated the balloon venoplasty at high atmospheric pressure for 1 minute.  Completion imaging showed inline flow with no residual stenosis greater than 20%.  The sheath in the basilic vein was removed and closed with a Monocryl.  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 Gareld June, M.D., [facility] [place] Office: [phone] Pager:  [phone]       Feliciana Horn M.D. [facility] 09/09/2023, 8:52 AM  Available via Epic secure chat 7am-7pm After 7 pm, please refer to night coverage provider listed on amion.

## 2023-09-09 NOTE — Progress Notes (Signed)
 [name] NEPHROLOGY PROGRESS NOTE  Assessment/ Plan: Pt is a 78 y.o. yo male with CAD status post CABG, underwent laparoscopic appendectomy on 5/7 developed postop ileus, AKI on CKD.  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  UA with pyuria and kidney ultrasound without hydronephrosis. No sign of renal recovery with worsening azotemia. First HD on 5/14 and tolerated well via temp HD cath, tolerated well. Plan for second HD today.  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  On 5/14, the AV fistula is unable to cannulate even with small needle 17G.  S/p RIJ temp HD cath by IR on 5/14. VVS following for perm access and TDC conversion.    # Appendicitis with possible abscess and postop ileus: On antibiotics and drain per surgical team.  # Acute blood loss anemia, CT angio negative for bleeding.  Transfuse as needed.  # Metabolic acidosis: dc sodium bicarbonate , HD now.  # CKD-MBD, hyperphosphatemia: Started sevelamer , pending PTH level.    Subjective: Seen and examined. Noted HD tube was out last night and placing new one today. Tolerated HD well. No other new event.  Objective Vital signs in last 24 hours: Vitals:   09/09/23 0334 09/09/23 0735 09/09/23 0850 09/09/23 0911  BP:  125/72 121/71   Pulse:  (!) 117    Resp:  (!) 22    Temp:  98.2 F (36.8 C)    TempSrc:  Oral    SpO2:  94%  93%  Weight: 92.8 kg     Height:       Weight change: -4.4 kg  Intake/Output Summary (Last 24 hours) at 09/09/2023 1103 Last data filed at 09/09/2023 0600 Gross per 24 hour  Intake 335.46 ml  Output 740 ml  Net -404.54 ml       Labs: RENAL PANEL Recent Labs  Lab 09/05/23 0810 09/06/23 0652 09/07/23 0323 09/08/23 0024 09/09/23 0651  NA 135  136 136 140 139  K 4.1 4.4 3.8 4.1 3.4*  CL 100 99 95* 96* 98  CO2 16* 16* 21* 19* 27  GLUCOSE 73 104* 72 82 198*  BUN 115* 119* 117* 119* 90*  CREATININE 7.31* 8.15* 8.66* 8.87* 6.75*  CALCIUM  7.9* 8.3* 8.1* 8.2* 8.1*  MG 2.5* 2.7* 2.4 2.6* 2.4  PHOS 9.4* 11.0* 10.4*  --  6.9*  ALBUMIN  2.1* 2.0* 1.7* 1.9* 1.7*    Liver Function Tests: Recent Labs  Lab 09/07/23 0323 09/08/23 0024 09/09/23 0651  AST  --  23 29  ALT  --  20 21  ALKPHOS  --  62 65  BILITOT  --  2.1* 1.0  PROT  --  6.2* 6.0*  ALBUMIN  1.7* 1.9* 1.7*   No results for input(s): "LIPASE", "AMYLASE" in the last 168 hours.  No results for input(s): "AMMONIA" in the last 168 hours. CBC: Recent Labs    01/18/23 1118 04/06/23 0910 09/04/23 1646 09/05/23 1008 09/06/23 0652 09/07/23 0323 09/08/23 0024  HGB  --    < > 11.2* 10.4* 11.4* 10.0* 10.9*  MCV  --    < >  --   --  98.9 95.4 96.7  VITAMINB12 565  --   --   --   --   --   --    < > =  values in this interval not displayed.    Cardiac Enzymes: No results for input(s): "CKTOTAL", "CKMB", "CKMBINDEX", "TROPONINI" in the last 168 hours. CBG: Recent Labs  Lab 09/08/23 1848 09/08/23 1951 09/09/23 0024 09/09/23 0338 09/09/23 0732  GLUCAP 92 88 176* 202* 185*    Iron Studies: No results for input(s): "IRON", "TIBC", "TRANSFERRIN", "FERRITIN" in the last 72 hours. Studies/Results: DG Chest Port 1 View Result Date: 09/09/2023 CLINICAL DATA:  Abdominal pain. EXAM: PORTABLE CHEST 1 VIEW COMPARISON:  09/07/2023 FINDINGS: The cardio pericardial silhouette is enlarged. Bibasilar atelectasis/infiltrate again noted with small bilateral pleural effusions. There is pulmonary vascular congestion without overt pulmonary edema. The NG tube passes into the stomach although the distal tip position is not included on the film. Right IJ central line tip overlies the proximal SVC level. Telemetry leads overlie the chest. IMPRESSION: 1. Bibasilar atelectasis/infiltrate with  small bilateral pleural effusions. 2. Pulmonary vascular congestion without overt pulmonary edema. Electronically Signed   By: Donnal Fusi M.D.   On: 09/09/2023 07:25   DG Abd 1 View Result Date: 09/08/2023 CLINICAL DATA:  NG tube placement EXAM: ABDOMEN - 1 VIEW COMPARISON:  09/07/2023 FINDINGS: Enteric tube has been retracted, side-port in the region of the distal esophagus. Multiple dilated loops of small bowel. IMPRESSION: Enteric tube has been retracted, side-port in the region of the distal esophagus. Recommend advancement by about 10 cm for more optimal positioning Electronically Signed   By: Esmeralda Hedge M.D.   On: 09/08/2023 23:47   CT ABDOMEN PELVIS W CONTRAST Result Date: 09/08/2023 CLINICAL DATA:  Abdominal pain. Postop day 8 for laparoscopic appendectomy. EXAM: CT ABDOMEN AND PELVIS WITH CONTRAST TECHNIQUE: Multidetector CT imaging of the abdomen and pelvis was performed using the standard protocol following bolus administration of intravenous contrast. RADIATION DOSE REDUCTION: This exam was performed according to the departmental dose-optimization program which includes automated exposure control, adjustment of the mA and/or kV according to patient size and/or use of iterative reconstruction technique. CONTRAST:  75mL OMNIPAQUE  IOHEXOL  350 MG/ML SOLN COMPARISON:  08/30/2023 and 09/02/2023 FINDINGS: Lower chest: Ventral hernia containing mediastinal and likely upper abdominal adipose tissues extending through a lower sternal defect with abnormal stranding within the herniated adipose tissues and abnormal stranding in the mediastinal adipose tissue anterior to the pericardium as on image 2 series 3. Moderate right and small left pleural effusions with pleural enhancement and passive atelectasis. Calcified pleural plaques at the lung bases. Rounded atelectasis in the left lower lobe and scarring in the lingula. Circumflex and right coronary artery atherosclerosis along with descending thoracic  aortic atherosclerosis. Hepatobiliary: Dependent density in the gallbladder compatible with either vicarious excretion of contrast (favored) or sludge. No significant hepatic parenchymal lesion identified. No biliary dilatation. Pancreas: Unremarkable Spleen: Hypodense partially exophytic posterior splenic mass measures 1.7 cm in diameter with portal venous phase density 64 Hounsfield units and delayed phase density 75 Hounsfield units, this may be a hemangioma but is technically nonspecific. Adrenals/Urinary Tract: Stable substantial adrenal thickening bilaterally compatible with hyperplasia. Stable benign renal cysts warranting no further imaging workup. Foley catheter noted in the urinary bladder which is otherwise empty. Stomach/Bowel: Nasogastric tube tip extends through the stomach and terminates in the descending duodenum. Scattered sigmoid colon diverticula. A drain is present terminating in the right lower quadrant and the drain terminates within a complex 5.5 by 11.7 by 4.3 cm (volume = 140 cm^3) collection immediately adjacent to a loop of small bowel in the right lower quadrant, and extending up adjacent to  the appendectomy site. This complex collection was smaller on the prior exam. The contrast medium in the terminal ileum and cecum is of higher density than the complex fluid in this collection, although strictly speaking a leak containing only a small amount of diluted contrast is not totally excluded. This could alternatively be an abscess but the poor response to the drain is of uncertain significance, correlate with chronic door aspirate from drainage. Is the drain fluid bloody or purulent? Substantially reduced complex fluid in the left paracolic gutter compared to previous. Small amount of complex fluid in the pelvis, favoring hemoperitoneum. Scattered loops of mildly dilated small bowel up to about 3.5 cm in diameter, potentially from postoperative ileus, correlate with return of bowel function  after surgery. There is likely some complex fluid along the right lower quadrant omentum for example on image 68 series 3. Some of the orally administered contrast extends around all the way to the rectum. Small amount of complex fluid adjacent to the sigmoid colon. Vascular/Lymphatic: Atherosclerosis is present, including aortoiliac atherosclerotic disease. Reproductive: Dorsal penoscrotal edema. Other: Previous small amount of free intraperitoneal gas has resolved. Musculoskeletal: Lower sternal defect as noted above. 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 Lumbar spondylosis and degenerative disc disease. Suspected right foraminal impingement at L2-3 and left foraminal impingement at L4-5. IMPRESSION: 1. The drain terminates within a complex 5.5 by 11.7 by 4.3 cm (volume = 140 cc) collection immediately adjacent to a loop of small bowel in the right lower quadrant, and extending up adjacent to the appendectomy site. This collection was smaller on the prior exam. Is the drain fluid bloody or purulent? There are other sites of complex fluid in the pelvis and adjacent to the sigmoid colon possibly from hemoperitoneum, and reduced complex fluid along the left paracolic gutter compared to previous. 2. Scattered loops of mildly dilated small bowel up to about 3.5 cm in diameter, potentially from postoperative ileus, correlate with return of bowel function after surgery. 3. Moderate right and small left pleural effusions with pleural enhancement and passive atelectasis. 4. Ventral hernia containing mediastinal and likely upper abdominal adipose tissues extending through a lower sternal defect with abnormal stranding within the herniated adipose tissues and abnormal stranding in the mediastinal adipose tissue anterior to the pericardium. 5. Rounded atelectasis in the left lower lobe and scarring in the lingula. 6. Dependent density in the gallbladder compatible with either vicarious  excretion of contrast (favored) or sludge. 7. 1.7 cm hypodense partially exophytic posterior splenic mass, this may be a hemangioma but is technically nonspecific. 8. Lumbar spondylosis and degenerative disc disease with suspected right foraminal impingement at L2-3 and left foraminal impingement at L4-5. 9. Mild dorsal penoscrotal edema. 10.  Aortic Atherosclerosis (ICD10-I70.0). Electronically Signed   By: Freida Jes M.D.   On: 09/08/2023 14:11   IR US  Guide Vasc Access Right Result Date: 09/08/2023 INDICATION: 78 year old male presents for temporary hemodialysis catheter EXAM: IMAGE GUIDED TEMPORARY HEMODIALYSIS CATHETER MEDICATIONS: None ANESTHESIA/SEDATION: None FLUOROSCOPY: Radiation Exposure Index (as provided by the fluoroscopic device): 1.8 mGy Kerma COMPLICATIONS: None PROCEDURE: Informed written consent was obtained from the patient's family after a discussion of the risks, benefits, and alternatives to treatment. Questions regarding the procedure were encouraged and answered. The right neck was prepped with chlorhexidine  in a sterile fashion, and a sterile drape was applied covering the operative field. Maximum barrier sterile technique with sterile gowns and gloves were used for the procedure. A timeout was performed prior to  the initiation of the procedure. A micropuncture kit was utilized to access the right internal jugular vein under direct, real-time ultrasound guidance after the overlying soft tissues were anesthetized with 1% lidocaine  with epinephrine . Ultrasound image documentation was performed. The microwire was kinked to measure appropriate catheter length. A stiff glidewire was advanced to the level of the IVC. A 15 cm hemodialysis catheter was then placed over the wire. Final catheter positioning was confirmed and documented with a spot radiographic image. The catheter aspirates and flushes normally. The catheter was flushed with appropriate volume heparin  dwells. Dressings were  applied. The patient tolerated the procedure well without immediate post procedural complication. IMPRESSION: Status post right IJ temporary hemodialysis catheter Signed, Marciano Settles. Rexine Cater, RPVI [facility] Electronically Signed   By: Myrlene Asper D.O.   On: 09/08/2023 13:20   DG CHEST PORT 1 VIEW Result Date: 09/07/2023 CLINICAL DATA:  Respiratory distress EXAM: PORTABLE CHEST 1 VIEW COMPARISON:  04/06/2023, CT chest 12/12/2020, chest x-ray 05/22/2021 FINDINGS: Sternotomy. Stable cardiomediastinal silhouettewhich appears slightly enlarged. There are chronic pleural and parenchymal changes on the left. There is new suspected right pleural effusion. Suspicion of vascular congestion and possible mild edema IMPRESSION: Chronic pleural and parenchymal changes on the left. The heart is enlarged. Suspect mild central congestion and probable new right-sided pleural effusion Electronically Signed   By: Esmeralda Hedge M.D.   On: 09/07/2023 23:54   DG Abd Portable 1V Result Date: 09/07/2023 CLINICAL DATA:  Abdomen pain EXAM: PORTABLE ABDOMEN - 1 VIEW COMPARISON:  09/06/2023 FINDINGS: Enteric tube tip overlies the distal stomach. Mild diffuse gaseous dilatation of large and small bowel. IMPRESSION: Enteric tube tip overlies the distal stomach. Mild diffuse gaseous dilatation of large and small bowel without significant change Electronically Signed   By: Esmeralda Hedge M.D.   On: 09/07/2023 23:50   VAS US  UPPER EXTREMITY ARTERIAL DUPLEX Result Date: 09/07/2023  UPPER EXTREMITY DUPLEX STUDY Patient Name:  Christian [name]  Date of Exam:   09/07/2023 Medical Rec #: [id number]        Accession #:    [id number] Date of Birth: 05-31-1945       Patient Gender: M Patient Age:   60 years Exam Location:  [facility] Procedure:      VAS US  UPPER EXTREMITY ARTERIAL DUPLEX Referring Phys: EMMA COLLINS  --------------------------------------------------------------------------------  Indications: Pain from vascular device, graft or implant.  Risk Factors:  Hypertension, hyperlipidemia, Diabetes. Other Factors: 09/18/2020 - ARTERIOVENOUS (AV) FISTULA CREATION LEFT VERSUS GRAFT Comparison Study: No prior studies. Performing Technologist: Lerry Ransom RVT  Examination Guidelines: A complete evaluation includes B-mode imaging, spectral Doppler, color Doppler, and power Doppler as needed of all accessible portions of each vessel. Bilateral testing is considered an integral part of a complete examination. Limited examinations for reoccurring indications may be performed as noted.  Left Doppler Findings: +--------------+----------+---------+--------+------------------+ Site          PSV (cm/s)Waveform StenosisComments           +--------------+----------+---------+--------+------------------+ Subclavian Mid76        triphasic                           +--------------+----------+---------+--------+------------------+ Axillary      134       triphasic                           +--------------+----------+---------+--------+------------------+  Brachial Prox 85        triphasic                           +--------------+----------+---------+--------+------------------+ Brachial Dist 69        triphasic                           +--------------+----------+---------+--------+------------------+ Radial Prox                              Unable to insonate +--------------+----------+---------+--------+------------------+ Radial Mid                               Unable to insonate +--------------+----------+---------+--------+------------------+ Radial Dist                              Unable to insonate +--------------+----------+---------+--------+------------------+ Ulnar Prox    69        triphasic                            +--------------+----------+---------+--------+------------------+ Ulnar Mid     66        triphasic                           +--------------+----------+---------+--------+------------------+ Ulnar Dist    67        triphasic                           +--------------+----------+---------+--------+------------------+ Flow is detected in the left brachio-basilic fistula.  Summary:  Left: No obstruction visualized in the left upper extremity. *See table(s) above for measurements and observations. Electronically signed by Delaney Fearing on 09/07/2023 at 4:09:38 PM.    Final    VAS US  UPPER EXT VEIN MAPPING (PRE-OP AVF) Result Date: 09/07/2023 UPPER EXTREMITY VEIN MAPPING Patient Name:  Christian [name]  Date of Exam:   09/07/2023 Medical Rec #: [id number]        Accession #:    [id number] Date of Birth: [birth year]       Patient Gender: M Patient Age:   85 years Exam Location:  [facility] Procedure:      VAS US  UPPER EXT VEIN MAPPING (PRE-OP AVF) Referring Phys: EMMA COLLINS --------------------------------------------------------------------------------  Indications: Pre-access. Comparison Study: No prior studies. Performing Technologist: Lerry Ransom RVT  Examination Guidelines: A complete evaluation includes B-mode imaging, spectral Doppler, color Doppler, and power Doppler as needed of all accessible portions of each vessel. Bilateral testing is considered an integral part of a complete examination. Limited examinations for reoccurring indications may be performed as noted. +-----------------+-------------+----------+--------+ Right Cephalic   Diameter (cm)Depth (cm)Findings +-----------------+-------------+----------+--------+ Shoulder             0.06        0.34            +-----------------+-------------+----------+--------+ Prox upper arm       0.04        0.39            +-----------------+-------------+----------+--------+ Mid upper arm        0.04        0.46             +-----------------+-------------+----------+--------+  Dist upper arm       0.08        0.22            +-----------------+-------------+----------+--------+ Antecubital fossa    0.06        0.33            +-----------------+-------------+----------+--------+ Prox forearm         0.13        0.40            +-----------------+-------------+----------+--------+ Mid forearm          0.19        0.46            +-----------------+-------------+----------+--------+ Dist forearm         0.12        0.37            +-----------------+-------------+----------+--------+ +-----------------+-------------+----------+---------+ Right Basilic    Diameter (cm)Depth (cm)Findings  +-----------------+-------------+----------+---------+ Shoulder             0.46        1.10             +-----------------+-------------+----------+---------+ Prox upper arm       0.34        1.10             +-----------------+-------------+----------+---------+ Mid upper arm        0.32        1.40             +-----------------+-------------+----------+---------+ Dist upper arm       0.33        0.73   branching +-----------------+-------------+----------+---------+ Antecubital fossa    0.22        0.62             +-----------------+-------------+----------+---------+ Prox forearm         0.21        0.40             +-----------------+-------------+----------+---------+ Mid forearm          0.19        0.38             +-----------------+-------------+----------+---------+ Distal forearm       0.19        0.29             +-----------------+-------------+----------+---------+ +-----------------+-------------+----------+--------+ Left Cephalic    Diameter (cm)Depth (cm)Findings +-----------------+-------------+----------+--------+ Shoulder             0.50        0.22            +-----------------+-------------+----------+--------+ Prox upper arm       0.05         0.31            +-----------------+-------------+----------+--------+ Mid upper arm        0.04        0.52            +-----------------+-------------+----------+--------+ Dist upper arm       0.05        0.46            +-----------------+-------------+----------+--------+ Antecubital fossa    0.06        0.41            +-----------------+-------------+----------+--------+ Prox forearm         0.12        0.47            +-----------------+-------------+----------+--------+ Mid forearm  0.00        0.44            +-----------------+-------------+----------+--------+ Dist forearm         0.06        0.36            +-----------------+-------------+----------+--------+ +-----------------+-------------+----------+---------+ Left Basilic     Diameter (cm)Depth (cm)Findings  +-----------------+-------------+----------+---------+ Shoulder             0.84        1.40             +-----------------+-------------+----------+---------+ Prox upper arm       0.38        0.41             +-----------------+-------------+----------+---------+ Mid upper arm                           HD Access +-----------------+-------------+----------+---------+ Dist upper arm                          HD Access +-----------------+-------------+----------+---------+ Antecubital fossa    0.14        0.45             +-----------------+-------------+----------+---------+ Prox forearm         0.05        0.24             +-----------------+-------------+----------+---------+ Mid forearm          0.05        0.22             +-----------------+-------------+----------+---------+ Distal forearm       0.05        0.32             +-----------------+-------------+----------+---------+ *See table(s) above for measurements and observations.  Diagnosing physician: Delaney Fearing Electronically signed by Delaney Fearing on 09/07/2023 at 4:09:10 PM.    Final    PERIPHERAL  VASCULAR CATHETERIZATION Result Date: 09/07/2023 Images from the original result were not included. Patient name: Christian [name] MRN: [id number] DOB: 06-08-1945 Sex: male 09/07/2023 Pre-operative Diagnosis: End-stage renal disease Post-operative diagnosis:  Same Surgeon:  Gareld June Procedure Performed:  1.  Ultrasound-guided access, left basilic vein  2.  Ultrasound-guided access, left brachial artery  3.  Fistulogram  4.  Balloon venoplasty left basilic vein  5.  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  His fistula does not have a good thrill to it and so he comes in today for fistulogram Procedure:  The patient was identified in the holding area and taken to room 8.  The patient was then placed supine on the table and prepped and draped in the usual sterile fashion.  A time out was called.  Conscious sedation was administered with the use of IV fentanyl  and Versed  under continuous physician and nurse monitoring.  Heart rate, blood pressure, and oxygen saturations were continuously monitored.  Total sedation time was 60 minutes ultrasound was used to evaluate the fistula.  The vein was patent and compressible.  A digital ultrasound image was acquired.  The fistula was then accessed under ultrasound guidance using a micropuncture needle.  An 018 wire was then asvanced without resistance and a micropuncture sheath was placed.  Contrast injections were then performed through the sheath.  Findings: No central venous stenosis.  The arteriovenous anastomosis is widely patent.  There is a stent just proximal to the arteriovenous anastomosis which is patent without significant stenosis.  There is however a turn in the vein which is associated with a greater than 70% stenosis.  This is in the mid upper arm  Intervention: After the above images were acquired  the decision made to proceed with intervention.  I tried to get a wire through the area of stenosis from the initial cannulation site in the upper arm however this was unsuccessful and so I cannulated the brachial artery under ultrasound guidance and placed a 5 French slender sheath.  I gave 3000 units of heparin  through the sheath.  I then advanced a Glidewire across the lesion and used a 660 Mustang balloon to perform balloon venoplasty.  Subsequent imaging revealed improved but suboptimal result and so I reinserted the balloon and repeated the balloon venoplasty at high atmospheric pressure for 1 minute.  Completion imaging showed inline flow with no residual stenosis greater than 20%.  The sheath in the basilic vein was removed and closed with a Monocryl.  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 Gareld June, M.D., FACS [facility] Office: [phone] Pager:  [phone]    Medications: Infusions:  piperacillin -tazobactam (ZOSYN )  IV 2.25 g (09/09/23 0615)   potassium chloride  10 mEq (09/09/23 1028)   TPN ADULT (ION) 35 mL/hr at 09/08/23 2025   TPN ADULT (ION)      Scheduled Medications:  Chlorhexidine  Gluconate Cloth  6 each Topical Q0600   fluticasone  furoate-vilanterol  1 puff Inhalation Daily   insulin  aspart  0-9 Units Subcutaneous Q4H   metoprolol  tartrate  5 mg Intravenous Q6H   sevelamer  carbonate  0.8 g Oral TID WC   sodium chloride  flush  3 mL Intravenous Q12H    have reviewed scheduled and prn medications.  Physical Exam: General:NAD, comfortable Heart:RRR, s1s2 nl Lungs:clear b/l, no crackle Abdomen:soft, drain present Extremities:No edema Neurology: Alert,  awake and following commands Vascular Access: AV fistula is quite deep, thrill positive. Temp HD cath.  Christian [name] 09/09/2023,11:03 AM  LOS: 9 days

## 2023-09-09 NOTE — Plan of Care (Signed)
  Problem: Coping: Goal: Ability to adjust to condition or change in health will improve Outcome: Progressing   Problem: Fluid Volume: Goal: Ability to maintain a balanced intake and output will improve Outcome: Progressing   Problem: Health Behavior/Discharge Planning: Goal: Ability to manage health-related needs will improve Outcome: Progressing   Problem: Metabolic: Goal: Ability to maintain appropriate glucose levels will improve Outcome: Progressing   Problem: Skin Integrity: Goal: Risk for impaired skin integrity will decrease Outcome: Progressing   Problem: Tissue Perfusion: Goal: Adequacy of tissue perfusion will improve Outcome: Progressing   Problem: Clinical Measurements: Goal: Will remain free from infection Outcome: Progressing Goal: Diagnostic test results will improve Outcome: Progressing Goal: Respiratory complications will improve Outcome: Progressing Goal: Cardiovascular complication will be avoided Outcome: Progressing

## 2023-09-09 NOTE — Progress Notes (Addendum)
 PHARMACY - TOTAL PARENTERAL NUTRITION CONSULT NOTE   Indication: Prolonged ileus  Patient Measurements: Height: 5\' 9"  (175.3 cm) Weight: 92.8 kg (204 lb 9.4 oz) IBW/kg (Calculated) : 70.7 TPN AdjBW (KG): 78.4 Body mass index is 30.21 kg/m. Usual Weight: 206-215lb per chart review and wife  Assessment:  78 yo M with acute appendicitis s/p appendectomy with persistent ileus. 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 Patient with little to no intake since admission (9 days) due to procedures and bloody drain output. Patient may be a little confused right now per RN. Spoke with Wife who states patient eats a variety of foods (vegetable soup with beef, hamburgers, fries etc) 5-6 times a day at home. Last good meal was 5/3 prior to admission. Pharmacy consulted for TPN.   Glucose / Insulin : BG 176-202 since TPN hung, A1C 7.3%, used 5 units SSI/24hr Home med- glimepiride  Electrolytes: K 3.4, phos 6.9, others wnl Renal: New HD- last 5/14, BUN 90 Hepatic: Tbili 2.1, others wnl, albumin  1.7, TG 137  Intake / Output; MIVF: UOP charted, NGT 247ml>fell out then replaced> pt removed again, drain 40mL, LBM 5/10 GI Imaging: 5/5 CT: acute appendicitis with possible small abscess  5/8 CT: drain in place, mild SB distension likely mild ileus  5/9 KUB: increased gas in SB and colon, likely adynamic ileus or early pSBO 5/11 KUB: Gas distended small bowel throughout the abdomen, unchanged.  5/12 KUB: Diffuse gaseous distention of bowel loops in the abdomen, SBO vs ileus  5/13 KUB:  Mild diffuse gaseous dilatation of large and small bowel without significant change 5/14 CT: Scattered loops of mildly dilated small bowel, possibly postop ileus  5/14 KUB: Multiple dilated loops of small bowel.   GI Surgeries / Procedures:  5/6 laparoscopic appendectomy with JP drain  Central access: CVC 5/14 TPN start date: 5/14   Nutritional Goals: Goal Concentrated TPN rate is 70 mL/hr (provides 116 g  of protein and 2192 kcals per day)  RD Assessment: Estimated Needs Total Energy Estimated Needs: 2150-2350 Total Protein Estimated Needs: 115-135 grams Total Fluid Estimated Needs: UOP + 1L  Current Nutrition:  NPO+ TPN  Plan:  Advance Concentrated TPN to 55 mL/hr at 1800, provides 91 g protein and 1723 kcals, meeting ~75% of estimated needs Electrolytes in TPN: Na 75 mEq/L, K 0 mEq/L, Ca 0 mEq/L, Mg 0 mEq/L, and Phos 0 mmol/L. Cl:Ac max acetate  Give Kcl 30 mEq IV  Add standard MVI and trace elements to TPN Continue Sensitive q4h SSI and adjust as needed   Add insulin  12units to TPN  Monitor TPN labs daily until stable at goal then on Mon/Thurs   Dorene Gang, PharmD, BCPS, [facility] Clinical Pharmacist  Please check AMION for all [facility]acy phone numbers After 10:00 PM, call Main Pharmacy [phone]

## 2023-09-09 NOTE — Progress Notes (Signed)
 HD Note:  Some information was entered later than the data was gathered due to patient care needs. The stated time with the data is accurate.  Treatment performed in KDU  Alert and oriented  Informed consent verified in chart  Access used: Right Neck CVC Access issues: None  Patient tolerated treatment fairly well.  No acute distress noted.   TX duration: 2.5 hours  Alert and Oriented.  Total UF removed: 1L as ordered  Hand-off given to patient's nurse Oswald Bless.   Awaiting arrival of transport for transfer back to room.   Isela Stantz RN Kidney Dialysis Unit.

## 2023-09-09 NOTE — Progress Notes (Signed)
 TRH night cross cover note:   I was notified by the patient's RN that the patient has removed his NG tube again.  Patient conveys that he is amenable to replacement of NGT, but asked if this can occur on dayshift in order for him to get some sleep.  Per patient request, will refrain from replacing NGT for the remainder of the night shift, with plan to reassess in the morning.     Camelia Cavalier, DO Hospitalist

## 2023-09-09 NOTE — Care Management Important Message (Signed)
 Important Message  Patient Details  Name: Christian [name] MRN: [id number] Date of Birth: 1946-04-21   Important Message Given:  Yes - Medicare IM     Wynonia Hedges 09/09/2023, 3:28 PM

## 2023-09-09 NOTE — Progress Notes (Signed)
 PT Cancellation Note  Patient Details Name: AYDEEN STRATHMAN MRN: [id number] DOB: [birth year]   Cancelled Treatment:    Reason Eval/Treat Not Completed: Patient at procedure or test/unavailable (Transport arriving to take patient to HD.)   Bryan Goin 09/09/2023, 2:48 PM

## 2023-09-09 NOTE — Progress Notes (Signed)
 [facility] Tube Team Note:  Consult received to place a bridle on a [place] sump NGT.   Bridle placed for 16-18Fr NGT. NGT at 77cm in the left nare. Pt tolerated well. RN may begin using tube once XR confirms placement.   Edwena Graham, RD, LDN Registered Dietitian II Please reach out via secure chat

## 2023-09-09 NOTE — Procedures (Signed)
 HD Note:  Some information was entered later than the data was gathered due to patient care needs. The stated time with the data is accurate.  Received patient in bed to unit.   Alert and oriented. Patient abdomen bloated with initial pain reported as 0.  Informed consent signed and in chart.   Access used: Right internal jugular HD trialysis catheter Access issues: None  With an hour left to go during treatment, patient complained of pain in his abdomen of 10/10.  Patient repositioned and meds given.  See MAR.  TX duration: 2.5 hours  Alert, without acute distress.  Total UF removed: 1000 ml  Hand-off given to patient's nurse.   Transported back to the room   Chee Kinslow L. Alva Jewels, RN Kidney Dialysis Unit.

## 2023-09-09 NOTE — Progress Notes (Addendum)
 TRH night cross cover note:   I was notified by the patient's RN  that the patient continues to exhibit sinus tachycardia. Current HR's in the 120's to 130's. Per my discussions with pt's RN, the patient is not complaining of any new or worsening symptoms at this time. Other VS appear stable including afebrile, systolic blood pressures in the 140s mmHg; oxygen saturations in the high 90s on room air.  Per additional chart review, it appears that he had similarly elevated HR's last night and that he remained tachycardic throughout much of day shift.  It appears that he is on a scheduled beta-blocker at home, which has been held in the setting of his postop ileus.  Per chart review, it appears that there is some improvement in heart rate last evening with a dose of IV Lopressor .  Given his otherwise stable vital signs, the absence of any new or worsening symptoms at this time, will order as needed IV Lopressor  for sustained heart rates greater than 130 bpm, will continue to closely monitor in ensuing VS trend. It is also noted that his morning labs include CMP, magnesium  level. Will also add CBC to these AM labs.     Camelia Cavalier, DO Hospitalist

## 2023-09-10 ENCOUNTER — Inpatient Hospital Stay (HOSPITAL_COMMUNITY)

## 2023-09-10 DIAGNOSIS — E1169 Type 2 diabetes mellitus with other specified complication: Secondary | ICD-10-CM | POA: Diagnosis not present

## 2023-09-10 DIAGNOSIS — D62 Acute posthemorrhagic anemia: Secondary | ICD-10-CM | POA: Diagnosis not present

## 2023-09-10 DIAGNOSIS — K37 Unspecified appendicitis: Secondary | ICD-10-CM | POA: Diagnosis not present

## 2023-09-10 DIAGNOSIS — I159 Secondary hypertension, unspecified: Secondary | ICD-10-CM | POA: Diagnosis not present

## 2023-09-10 LAB — RENAL FUNCTION PANEL
Albumin: 1.7 g/dL — ABNORMAL LOW (ref 3.5–5.0)
Anion gap: 16 — ABNORMAL HIGH (ref 5–15)
BUN: 67 mg/dL — ABNORMAL HIGH (ref 8–23)
CO2: 29 mmol/L (ref 22–32)
Calcium: 8.4 mg/dL — ABNORMAL LOW (ref 8.9–10.3)
Chloride: 94 mmol/L — ABNORMAL LOW (ref 98–111)
Creatinine, Ser: 5.32 mg/dL — ABNORMAL HIGH (ref 0.61–1.24)
GFR, Estimated: 10 mL/min — ABNORMAL LOW (ref >60)
Glucose, Bld: 195 mg/dL — ABNORMAL HIGH (ref 70–99)
Phosphorus: 5.1 mg/dL — ABNORMAL HIGH (ref 2.5–4.6)
Potassium: 3.1 mmol/L — ABNORMAL LOW (ref 3.5–5.1)
Sodium: 139 mmol/L (ref 135–145)

## 2023-09-10 LAB — CBC WITH DIFFERENTIAL/PLATELET
Abs Immature Granulocytes: 0.65 10*3/uL — ABNORMAL HIGH (ref 0.00–0.07)
Basophils Absolute: 0.1 10*3/uL (ref 0.0–0.1)
Basophils Relative: 1 %
Eosinophils Absolute: 0.1 10*3/uL (ref 0.0–0.5)
Eosinophils Relative: 1 %
HCT: 25.7 % — ABNORMAL LOW (ref 39.0–52.0)
Hemoglobin: 8.5 g/dL — ABNORMAL LOW (ref 13.0–17.0)
Immature Granulocytes: 5 %
Lymphocytes Relative: 5 %
Lymphs Abs: 0.7 10*3/uL (ref 0.7–4.0)
MCH: 33.1 pg (ref 26.0–34.0)
MCHC: 33.1 g/dL (ref 30.0–36.0)
MCV: 100 fL (ref 80.0–100.0)
Monocytes Absolute: 1 10*3/uL (ref 0.1–1.0)
Monocytes Relative: 7 %
Neutro Abs: 11.2 10*3/uL — ABNORMAL HIGH (ref 1.7–7.7)
Neutrophils Relative %: 81 %
Platelets: 411 10*3/uL — ABNORMAL HIGH (ref 150–400)
RBC: 2.57 MIL/uL — ABNORMAL LOW (ref 4.22–5.81)
RDW: 16.1 % — ABNORMAL HIGH (ref 11.5–15.5)
WBC: 13.8 10*3/uL — ABNORMAL HIGH (ref 4.0–10.5)
nRBC: 0 % (ref 0.0–0.2)

## 2023-09-10 LAB — GLUCOSE, CAPILLARY
Glucose-Capillary: 177 mg/dL — ABNORMAL HIGH (ref 70–99)
Glucose-Capillary: 225 mg/dL — ABNORMAL HIGH (ref 70–99)
Glucose-Capillary: 199 mg/dL — ABNORMAL HIGH (ref 70–99)
Glucose-Capillary: 189 mg/dL — ABNORMAL HIGH (ref 70–99)
Glucose-Capillary: 233 mg/dL — ABNORMAL HIGH (ref 70–99)
Glucose-Capillary: 218 mg/dL — ABNORMAL HIGH (ref 70–99)

## 2023-09-10 LAB — BASIC METABOLIC PANEL WITH GFR
Anion gap: 15 (ref 5–15)
BUN: 67 mg/dL — ABNORMAL HIGH (ref 8–23)
CO2: 30 mmol/L (ref 22–32)
Calcium: 8.4 mg/dL — ABNORMAL LOW (ref 8.9–10.3)
Chloride: 94 mmol/L — ABNORMAL LOW (ref 98–111)
Creatinine, Ser: 5.28 mg/dL — ABNORMAL HIGH (ref 0.61–1.24)
GFR, Estimated: 11 mL/min — ABNORMAL LOW (ref >60)
Glucose, Bld: 196 mg/dL — ABNORMAL HIGH (ref 70–99)
Potassium: 3.1 mmol/L — ABNORMAL LOW (ref 3.5–5.1)
Sodium: 139 mmol/L (ref 135–145)

## 2023-09-10 LAB — PHOSPHORUS: Phosphorus: 5.1 mg/dL — ABNORMAL HIGH (ref 2.5–4.6)

## 2023-09-10 LAB — MAGNESIUM: Magnesium: 2.3 mg/dL (ref 1.7–2.4)

## 2023-09-10 MED ORDER — POTASSIUM CHLORIDE 10 MEQ/100ML IV SOLN
10.0000 meq | INTRAVENOUS | Status: AC
  Administered 2023-09-10 (×4): 10 meq via INTRAVENOUS
  Filled 2023-09-10 (×4): qty 100

## 2023-09-10 MED ORDER — TRACE MINERALS CU-MN-SE-ZN 300-55-60-3000 MCG/ML IV SOLN
INTRAVENOUS | Status: AC
  Filled 2023-09-10: qty 772.8

## 2023-09-10 MED ORDER — CHLORHEXIDINE GLUCONATE CLOTH 2 % EX PADS
6.0000 | MEDICATED_PAD | Freq: Every day | CUTANEOUS | Status: DC
  Administered 2023-09-10 – 2023-09-16 (×7): 6 via TOPICAL

## 2023-09-10 MED ORDER — HYDROMORPHONE HCL 1 MG/ML IJ SOLN
0.5000 mg | Freq: Once | INTRAMUSCULAR | Status: AC
  Administered 2023-09-10: 0.5 mg via INTRAVENOUS

## 2023-09-10 NOTE — Progress Notes (Signed)
 Received consult for DBIV. Pt has TPN infusing via HD pigtail. Labs to be drawn by phlebotomy. Pharmacy and RN aware.

## 2023-09-10 NOTE — Progress Notes (Addendum)
 [facility] NEPHROLOGY PROGRESS NOTE  Assessment/ Plan: Pt is a 78 y.o. yo male with CAD status post CABG, underwent laparoscopic appendectomy on 5/7 developed postop ileus, AKI on CKD.  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  UA with pyuria and kidney ultrasound without hydronephrosis. No sign of renal recovery with worsening azotemia. First HD on 5/14 and second HD on 5/15 and tolerated well via temp HD cath. Plan for 3rd HD tomorrow.  Renal navigator is aware of arranging outpatient HD.  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  On 5/14, the AV fistula is unable to cannulate even with small needle 17G.  S/p RIJ temp HD cath by IR on 5/14. VVS following for perm access and TDC conversion.    # Appendicitis with possible abscess and postop ileus: On antibiotics and drain per surgical team.  # Acute blood loss anemia, CT angio negative for bleeding.  Transfuse as needed.  # Metabolic acidosis: dc sodium bicarbonate , HD now.  # CKD-MBD, hyperphosphatemia: Started sevelamer , PTH level 337.   Subjective: Seen and examined.  Tolerated second dialysis yesterday with around 1 L UF.  No new event.  Currently has NG tube and feels weak and tired. Objective Vital signs in last 24 hours: Vitals:   09/09/23 2030 09/10/23 0015 09/10/23 0404 09/10/23 0815  BP: 114/63  111/73 122/62  Pulse: (!) 107  (!) 106 99  Resp: 16 18 18    Temp: 97.8 F (36.6 C) 98.1 F (36.7 C) 99.1 F (37.3 C) 98.3 F (36.8 C)  TempSrc: Oral Oral Oral Oral  SpO2: 100%  99% 99%  Weight:      Height:       Weight change: 1.8 kg  Intake/Output Summary (Last 24 hours) at 09/10/2023 0907 Last data filed at 09/10/2023 0300 Gross per 24 hour  Intake 334.04 ml  Output 1365 ml   Net -1030.96 ml       Labs: RENAL PANEL Recent Labs  Lab 09/05/23 0810 09/06/23 0652 09/07/23 0323 09/08/23 0024 09/09/23 0651  NA 135 136 136 140 139  K 4.1 4.4 3.8 4.1 3.4*  CL 100 99 95* 96* 98  CO2 16* 16* 21* 19* 27  GLUCOSE 73 104* 72 82 198*  BUN 115* 119* 117* 119* 90*  CREATININE 7.31* 8.15* 8.66* 8.87* 6.75*  CALCIUM  7.9* 8.3* 8.1* 8.2* 8.1*  MG 2.5* 2.7* 2.4 2.6* 2.4  PHOS 9.4* 11.0* 10.4*  --  6.9*  ALBUMIN  2.1* 2.0* 1.7* 1.9* 1.7*    Liver Function Tests: Recent Labs  Lab 09/07/23 0323 09/08/23 0024 09/09/23 0651  AST  --  23 29  ALT  --  20 21  ALKPHOS  --  62 65  BILITOT  --  2.1* 1.0  PROT  --  6.2* 6.0*  ALBUMIN  1.7* 1.9* 1.7*   No results for input(s): "LIPASE", "AMYLASE" in the last 168 hours.  No results for input(s): "AMMONIA" in the last 168 hours. CBC: Recent Labs    01/18/23 1118 04/06/23 0910 09/05/23 1008 09/06/23 0652 09/07/23 0323 09/08/23 0024 09/09/23 1535  HGB  --    < > 10.4* 11.4* 10.0* 10.9* 8.3*  MCV  --    < >  --  98.9 95.4 96.7 100.0  VITAMINB12 565  --   --   --   --   --   --    < > =  values in this interval not displayed.    Cardiac Enzymes: No results for input(s): "CKTOTAL", "CKMB", "CKMBINDEX", "TROPONINI" in the last 168 hours. CBG: Recent Labs  Lab 09/09/23 1203 09/09/23 2129 09/09/23 2343 09/10/23 0404 09/10/23 0839  GLUCAP 178* 221* 246* 177* 225*    Iron Studies: No results for input(s): "IRON", "TIBC", "TRANSFERRIN", "FERRITIN" in the last 72 hours. Studies/Results: DG Abd 1 View Result Date: 09/10/2023 CLINICAL DATA:  NG tube placement. EXAM: ABDOMEN - 1 VIEW COMPARISON:  09/09/2023 FINDINGS: NG tube tip is in the stomach with proximal side port below the GE junction. Diffuse gaseous small bowel distension identified with gas dilated loop in the left abdomen measuring up to 5.6 cm diameter. IMPRESSION: 1. NG tube tip is in the stomach. 2. Diffuse gaseous small bowel dilatation within the  visualized upper abdomen. Electronically Signed   By: Donnal Fusi M.D.   On: 09/10/2023 07:29   DG Abd 1 View Result Date: 09/09/2023 CLINICAL DATA:  Nasogastric tube placement. EXAM: ABDOMEN - 1 VIEW COMPARISON:  None Available. FINDINGS: The tip and side port of the enteric tube are below the diaphragm in the stomach. The tube is looped in the mid stomach with the tip directed cranially. IMPRESSION: Enteric tube looped in the mid stomach with the tip directed cranially. Electronically Signed   By: Chadwick Colonel M.D.   On: 09/09/2023 20:49   DG Abd Portable 1V Result Date: 09/09/2023 CLINICAL DATA:  Dislodged gastrostomy tube. EXAM: PORTABLE ABDOMEN - 1 VIEW COMPARISON:  CT abdomen pelvis dated 09/08/2023. FINDINGS: Enteric tube extends into the distal stomach and fold back on itself with tip in the proximal thigh. Diffusely dilated air-filled loops of small bowel measure up to 5 cm. No acute osseous pathology. IMPRESSION: 1. Enteric tube extends into the distal stomach and fold back on itself with tip in the proximal thigh. 2. Diffusely dilated air-filled loops of small bowel. Electronically Signed   By: Angus Bark M.D.   On: 09/09/2023 16:11   DG Chest Port 1 View Result Date: 09/09/2023 CLINICAL DATA:  Abdominal pain. EXAM: PORTABLE CHEST 1 VIEW COMPARISON:  09/07/2023 FINDINGS: The cardio pericardial silhouette is enlarged. Bibasilar atelectasis/infiltrate again noted with small bilateral pleural effusions. There is pulmonary vascular congestion without overt pulmonary edema. The NG tube passes into the stomach although the distal tip position is not included on the film. Right IJ central line tip overlies the proximal SVC level. Telemetry leads overlie the chest. IMPRESSION: 1. Bibasilar atelectasis/infiltrate with small bilateral pleural effusions. 2. Pulmonary vascular congestion without overt pulmonary edema. Electronically Signed   By: Donnal Fusi M.D.   On: 09/09/2023 07:25   DG  Abd 1 View Result Date: 09/08/2023 CLINICAL DATA:  NG tube placement EXAM: ABDOMEN - 1 VIEW COMPARISON:  09/07/2023 FINDINGS: Enteric tube has been retracted, side-port in the region of the distal esophagus. Multiple dilated loops of small bowel. IMPRESSION: Enteric tube has been retracted, side-port in the region of the distal esophagus. Recommend advancement by about 10 cm for more optimal positioning Electronically Signed   By: Esmeralda Hedge M.D.   On: 09/08/2023 23:47   CT ABDOMEN PELVIS W CONTRAST Result Date: 09/08/2023 CLINICAL DATA:  Abdominal pain. Postop day 8 for laparoscopic appendectomy. EXAM: CT ABDOMEN AND PELVIS WITH CONTRAST TECHNIQUE: Multidetector CT imaging of the abdomen and pelvis was performed using the standard protocol following bolus administration of intravenous contrast. RADIATION DOSE REDUCTION: This exam was performed according to the departmental dose-optimization program which  includes automated exposure control, adjustment of the mA and/or kV according to patient size and/or use of iterative reconstruction technique. CONTRAST:  75mL OMNIPAQUE  IOHEXOL  350 MG/ML SOLN COMPARISON:  08/30/2023 and 09/02/2023 FINDINGS: Lower chest: Ventral hernia containing mediastinal and likely upper abdominal adipose tissues extending through a lower sternal defect with abnormal stranding within the herniated adipose tissues and abnormal stranding in the mediastinal adipose tissue anterior to the pericardium as on image 2 series 3. Moderate right and small left pleural effusions with pleural enhancement and passive atelectasis. Calcified pleural plaques at the lung bases. Rounded atelectasis in the left lower lobe and scarring in the lingula. Circumflex and right coronary artery atherosclerosis along with descending thoracic aortic atherosclerosis. Hepatobiliary: Dependent density in the gallbladder compatible with either vicarious excretion of contrast (favored) or sludge. No significant hepatic  parenchymal lesion identified. No biliary dilatation. Pancreas: Unremarkable Spleen: Hypodense partially exophytic posterior splenic mass measures 1.7 cm in diameter with portal venous phase density 64 Hounsfield units and delayed phase density 75 Hounsfield units, this may be a hemangioma but is technically nonspecific. Adrenals/Urinary Tract: Stable substantial adrenal thickening bilaterally compatible with hyperplasia. Stable benign renal cysts warranting no further imaging workup. Foley catheter noted in the urinary bladder which is otherwise empty. Stomach/Bowel: Nasogastric tube tip extends through the stomach and terminates in the descending duodenum. Scattered sigmoid colon diverticula. A drain is present terminating in the right lower quadrant and the drain terminates within a complex 5.5 by 11.7 by 4.3 cm (volume = 140 cm^3) collection immediately adjacent to a loop of small bowel in the right lower quadrant, and extending up adjacent to the appendectomy site. This complex collection was smaller on the prior exam. The contrast medium in the terminal ileum and cecum is of higher density than the complex fluid in this collection, although strictly speaking a leak containing only a small amount of diluted contrast is not totally excluded. This could alternatively be an abscess but the poor response to the drain is of uncertain significance, correlate with chronic door aspirate from drainage. Is the drain fluid bloody or purulent? Substantially reduced complex fluid in the left paracolic gutter compared to previous. Small amount of complex fluid in the pelvis, favoring hemoperitoneum. Scattered loops of mildly dilated small bowel up to about 3.5 cm in diameter, potentially from postoperative ileus, correlate with return of bowel function after surgery. There is likely some complex fluid along the right lower quadrant omentum for example on image 68 series 3. Some of the orally administered contrast extends  around all the way to the rectum. Small amount of complex fluid adjacent to the sigmoid colon. Vascular/Lymphatic: Atherosclerosis is present, including aortoiliac atherosclerotic disease. Reproductive: Dorsal penoscrotal edema. Other: Previous small amount of free intraperitoneal gas has resolved. Musculoskeletal: Lower sternal defect as noted above. 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 Lumbar spondylosis and degenerative disc disease. Suspected right foraminal impingement at L2-3 and left foraminal impingement at L4-5. IMPRESSION: 1. The drain terminates within a complex 5.5 by 11.7 by 4.3 cm (volume = 140 cc) collection immediately adjacent to a loop of small bowel in the right lower quadrant, and extending up adjacent to the appendectomy site. This collection was smaller on the prior exam. Is the drain fluid bloody or purulent? There are other sites of complex fluid in the pelvis and adjacent to the sigmoid colon possibly from hemoperitoneum, and reduced complex fluid along the left paracolic gutter compared to previous. 2. Scattered loops of  mildly dilated small bowel up to about 3.5 cm in diameter, potentially from postoperative ileus, correlate with return of bowel function after surgery. 3. Moderate right and small left pleural effusions with pleural enhancement and passive atelectasis. 4. Ventral hernia containing mediastinal and likely upper abdominal adipose tissues extending through a lower sternal defect with abnormal stranding within the herniated adipose tissues and abnormal stranding in the mediastinal adipose tissue anterior to the pericardium. 5. Rounded atelectasis in the left lower lobe and scarring in the lingula. 6. Dependent density in the gallbladder compatible with either vicarious excretion of contrast (favored) or sludge. 7. 1.7 cm hypodense partially exophytic posterior splenic mass, this may be a hemangioma but is technically nonspecific. 8.  Lumbar spondylosis and degenerative disc disease with suspected right foraminal impingement at L2-3 and left foraminal impingement at L4-5. 9. Mild dorsal penoscrotal edema. 10.  Aortic Atherosclerosis (ICD10-I70.0). Electronically Signed   By: Freida Jes M.D.   On: 09/08/2023 14:11   IR US  Guide Vasc Access Right Result Date: 09/08/2023 INDICATION: 78 year old male presents for temporary hemodialysis catheter EXAM: IMAGE GUIDED TEMPORARY HEMODIALYSIS CATHETER MEDICATIONS: None ANESTHESIA/SEDATION: None FLUOROSCOPY: Radiation Exposure Index (as provided by the fluoroscopic device): 1.8 mGy Kerma COMPLICATIONS: None PROCEDURE: Informed written consent was obtained from the patient's family after a discussion of the risks, benefits, and alternatives to treatment. Questions regarding the procedure were encouraged and answered. The right neck was prepped with chlorhexidine  in a sterile fashion, and a sterile drape was applied covering the operative field. Maximum barrier sterile technique with sterile gowns and gloves were used for the procedure. A timeout was performed prior to the initiation of the procedure. A micropuncture kit was utilized to access the right internal jugular vein under direct, real-time ultrasound guidance after the overlying soft tissues were anesthetized with 1% lidocaine  with epinephrine . Ultrasound image documentation was performed. The microwire was kinked to measure appropriate catheter length. A stiff glidewire was advanced to the level of the IVC. A 15 cm hemodialysis catheter was then placed over the wire. Final catheter positioning was confirmed and documented with a spot radiographic image. The catheter aspirates and flushes normally. The catheter was flushed with appropriate volume heparin  dwells. Dressings were applied. The patient tolerated the procedure well without immediate post procedural complication. IMPRESSION: Status post right IJ temporary hemodialysis catheter  Signed, Marciano Settles. Rexine Cater, RPVI [facility] Electronically Signed   By: Myrlene Asper D.O.   On: 09/08/2023 13:20    Medications: Infusions:  piperacillin -tazobactam (ZOSYN )  IV 2.25 g (09/10/23 0638)   TPN ADULT (ION) 55 mL/hr at 09/09/23 2055    Scheduled Medications:  Chlorhexidine  Gluconate Cloth  6 each Topical Q0600   fluticasone  furoate-vilanterol  1 puff Inhalation Daily   heparin  injection (subcutaneous)  5,000 Units Subcutaneous Q8H   insulin  aspart  0-9 Units Subcutaneous Q4H   metoprolol  tartrate  5 mg Intravenous Q6H   sevelamer  carbonate  0.8 g Oral TID WC   sodium chloride  flush  3 mL Intravenous Q12H    have reviewed scheduled and prn medications.  Physical Exam: General:NAD, he has NG tube. Heart:RRR, s1s2 nl Lungs:clear b/l, no crackle Abdomen:soft, drain present Extremities:No edema Neurology: Alert, awake and following commands Vascular Access: AV fistula is quite deep, thrill positive. Temp HD cath.  Jonavin Seder Prasad Rossana Molchan 09/10/2023,9:07 AM  LOS: 10 days

## 2023-09-10 NOTE — Progress Notes (Signed)
 [facility]                                                                               Christian [name], is a 78 y.o. male, DOB - 1946-01-01, ION:[id number] Admit date - 08/30/2023    Outpatient Primary MD for the patient is [name], MD  LOS - 10  days    Brief summary    78 y.o. yo male with CAD status post CABG, postoperative CVA and renal failure , underwent laparoscopic appendectomy for perforated appendicitis by Dr Aniceto Barley on 5/6 developed postop ileus, AKI on CKD stage 4, currently on HD.   Assessment & Plan    Assessment and Plan:  Appendicitis with possible periappendiceal abscess Post op ileus - s/p lap appendectomy on 5/6 - continue zosyn  as per general surgery. Day 11 of IV zosyn .  - drain remains in place with dark sanguinous output .  - Poor intake and no sign of ileus improving.  Surgery recommending TPN which was started on 5/14. - patient underwent Placement of a right IJ approach triple lumen temp HD cath  on 5/14 as putting in a tunneled HD catheter at this time with leukocytosis might be risky and more prone to infection.  - continue NGT for decompression ,pain controlled.  - gen surgery, IR, vascular surgery on board.  - CT abdomen and pelvis shows complex 5.5 by 11.7 by 4.3 cm collection adjacent to the appendectomy site. - continue with TPN.    Acute on CKDIV ATN - patient has history of CKD4. Baseline creat ~ 3.2 - 3.5, eGFR~ 17 - he has required HD the past - possibly volume depletion from surgery/blood loss and when he had severe hypoTN on 5/7; I don't think due to zosyn  but AIN always a possibility as well  - seems to be ATN with uptrending creat/BUN; has been slowly approaching need for dialysis - Nephrology has been following also - Vascular surgery consulted; appreciate assistance.  Fistulogram performed 09/07/2023 and balloon venoplasty performed and left arm fistula ready for use.   Unfortunately left arm fistula cannot  be cannulated at this time.     ABLA Hemoglobin  dropped from 10 to 8.5 , transfuse to keep hemoglobin greater than 8.     HTN Well controlled.    Type 2 DM; well controlled CBG'S CBG (last 3)  Recent Labs    09/10/23 0404 09/10/23 0839 09/10/23 1135  GLUCAP 177* 225* 199*   Resume SSI. No change to meds.    CAD - s/p CABG - cleared by cardiology prior to surgery  Thrombocytosis Monitor.     Hypokalemia  Replaced.     Malnutrition Type:  Nutrition Problem: Inadequate oral intake Etiology: inability to eat   Malnutrition Characteristics:  Signs/Symptoms: NPO status   Nutrition Interventions:  Interventions: TPN  Estimated body mass index is 29.59 kg/m as calculated from the following:   Height as of this encounter: 5\' 9"  (1.753 m).   Weight as of this encounter: 90.9 kg.  Code Status: full code.  DVT Prophylaxis:  heparin  injection 5,000 Units Start: 09/09/23 1400 Place and maintain sequential compression device Start: 09/01/23 2001 SCDs Start:  08/31/23 0415   Level of Care: Level of care: Progressive Family Communication: None at bedside.   Disposition Plan:     Remains inpatient appropriate:  Pending clinical improvement.    Procedures:  Lap appendectomy.   Consultants:   General surgery Nephrology Vascular surgery.  IR.   Antimicrobials:   Anti-infectives (From admission, onward)    Start     Dose/Rate Route Frequency Ordered Stop   08/31/23 2200  piperacillin -tazobactam (ZOSYN ) IVPB 2.25 g        2.25 g 100 mL/hr over 30 Minutes Intravenous Every 8 hours 08/31/23 1953     08/31/23 0600  piperacillin -tazobactam (ZOSYN ) IVPB 3.375 g  Status:  Discontinued        3.375 g 100 mL/hr over 30 Minutes Intravenous Every 8 hours 08/31/23 0532 08/31/23 1953   08/30/23 2245  piperacillin -tazobactam (ZOSYN ) IVPB 3.375 g        3.375 g 100 mL/hr over 30 Minutes Intravenous  Once 08/30/23 2233 08/31/23 0005         Medications  Scheduled Meds:  Chlorhexidine  Gluconate Cloth  6 each Topical Q0600   Chlorhexidine  Gluconate Cloth  6 each Topical Q0600   fluticasone  furoate-vilanterol  1 puff Inhalation Daily   heparin  injection (subcutaneous)  5,000 Units Subcutaneous Q8H   insulin  aspart  0-9 Units Subcutaneous Q4H   metoprolol  tartrate  5 mg Intravenous Q6H   sevelamer  carbonate  0.8 g Oral TID WC   sodium chloride  flush  3 mL Intravenous Q12H   Continuous Infusions:  piperacillin -tazobactam (ZOSYN )  IV 2.25 g (09/10/23 1421)   potassium chloride  10 mEq (09/10/23 1420)   TPN ADULT (ION) 55 mL/hr at 09/09/23 2055   TPN ADULT (ION)     PRN Meds:.albuterol , dextrose , HYDROmorphone  (DILAUDID ) injection, LORazepam , methocarbamol  (ROBAXIN ) injection, metoprolol  tartrate, naLOXone (NARCAN)  injection, simethicone , sodium chloride  flush, traMADol     Subjective:   Christian [name] was seen and examined today. Continues to have abdominal pain.    Objective:   Vitals:   09/10/23 0815 09/10/23 1009 09/10/23 1114 09/10/23 1251  BP: 122/62   131/70  Pulse: 99   100  Resp:  18 20 18   Temp: 98.3 F (36.8 C)  98.2 F (36.8 C) 98.2 F (36.8 C)  TempSrc: Oral  Oral Oral  SpO2: 99%   100%  Weight:      Height:        Intake/Output Summary (Last 24 hours) at 09/10/2023 1517 Last data filed at 09/10/2023 1300 Gross per 24 hour  Intake 334.04 ml  Output 1000 ml  Net -665.96 ml   Filed Weights   09/09/23 0334 09/09/23 1525 09/09/23 1819  Weight: 92.8 kg 91.9 kg 90.9 kg     Exam General exam: Appears calm and comfortable  Respiratory system: Clear to auscultation. Respiratory effort normal. Cardiovascular system: S1 & S2 heard, RRR. No JVD, Gastrointestinal system: Abdomen is  soft distended, s/p ngt. S/p JP drain in place.  Central nervous system: Alert and oriented.  Extremities: no pedal edema.  Skin: No rashes,  Psychiatry: Mood & affect appropriate.      Data Reviewed:  I  have personally reviewed following labs and imaging studies   CBC Lab Results  Component Value Date   WBC 13.8 (H) 09/10/2023   RBC 2.57 (L) 09/10/2023   HGB 8.5 (L) 09/10/2023   HCT 25.7 (L) 09/10/2023   MCV 100.0 09/10/2023   MCH 33.1 09/10/2023   PLT 411 (H) 09/10/2023   MCHC  33.1 09/10/2023   RDW 16.1 (H) 09/10/2023   LYMPHSABS 0.7 09/10/2023   MONOABS 1.0 09/10/2023   EOSABS 0.1 09/10/2023   BASOSABS 0.1 09/10/2023     Last metabolic panel Lab Results  Component Value Date   NA 139 09/10/2023   K 3.1 (L) 09/10/2023   CL 94 (L) 09/10/2023   CO2 29 09/10/2023   BUN 67 (H) 09/10/2023   CREATININE 5.32 (H) 09/10/2023   GLUCOSE 195 (H) 09/10/2023   GFRNONAA 10 (L) 09/10/2023   GFRAA 19 (L) 10/18/2019   CALCIUM  8.4 (L) 09/10/2023   PHOS 5.1 (H) 09/10/2023   PROT 6.0 (L) 09/09/2023   ALBUMIN  1.7 (L) 09/10/2023   LABGLOB 2.4 05/22/2021   AGRATIO 1.9 05/22/2021   BILITOT 1.0 09/09/2023   ALKPHOS 65 09/09/2023   AST 29 09/09/2023   ALT 21 09/09/2023   ANIONGAP 16 (H) 09/10/2023    CBG (last 3)  Recent Labs    09/10/23 0404 09/10/23 0839 09/10/23 1135  GLUCAP 177* 225* 199*      Coagulation Profile: No results for input(s): "INR", "PROTIME" in the last 168 hours.   Radiology Studies: DG Abd 1 View Result Date: 09/10/2023 CLINICAL DATA:  NG tube placement. EXAM: ABDOMEN - 1 VIEW COMPARISON:  09/09/2023 FINDINGS: NG tube tip is in the stomach with proximal side port below the GE junction. Diffuse gaseous small bowel distension identified with gas dilated loop in the left abdomen measuring up to 5.6 cm diameter. IMPRESSION: 1. NG tube tip is in the stomach. 2. Diffuse gaseous small bowel dilatation within the visualized upper abdomen. Electronically Signed   By: Donnal Fusi M.D.   On: 09/10/2023 07:29   DG Abd 1 View Result Date: 09/09/2023 CLINICAL DATA:  Nasogastric tube placement. EXAM: ABDOMEN - 1 VIEW COMPARISON:  None Available. FINDINGS: The tip and  side port of the enteric tube are below the diaphragm in the stomach. The tube is looped in the mid stomach with the tip directed cranially. IMPRESSION: Enteric tube looped in the mid stomach with the tip directed cranially. Electronically Signed   By: Chadwick Colonel M.D.   On: 09/09/2023 20:49   DG Abd Portable 1V Result Date: 09/09/2023 CLINICAL DATA:  Dislodged gastrostomy tube. EXAM: PORTABLE ABDOMEN - 1 VIEW COMPARISON:  CT abdomen pelvis dated 09/08/2023. FINDINGS: Enteric tube extends into the distal stomach and fold back on itself with tip in the proximal thigh. Diffusely dilated air-filled loops of small bowel measure up to 5 cm. No acute osseous pathology. IMPRESSION: 1. Enteric tube extends into the distal stomach and fold back on itself with tip in the proximal thigh. 2. Diffusely dilated air-filled loops of small bowel. Electronically Signed   By: Angus Bark M.D.   On: 09/09/2023 16:11   DG Chest Port 1 View Result Date: 09/09/2023 CLINICAL DATA:  Abdominal pain. EXAM: PORTABLE CHEST 1 VIEW COMPARISON:  09/07/2023 FINDINGS: The cardio pericardial silhouette is enlarged. Bibasilar atelectasis/infiltrate again noted with small bilateral pleural effusions. There is pulmonary vascular congestion without overt pulmonary edema. The NG tube passes into the stomach although the distal tip position is not included on the film. Right IJ central line tip overlies the proximal SVC level. Telemetry leads overlie the chest. IMPRESSION: 1. Bibasilar atelectasis/infiltrate with small bilateral pleural effusions. 2. Pulmonary vascular congestion without overt pulmonary edema. Electronically Signed   By: Donnal Fusi M.D.   On: 09/09/2023 07:25   DG Abd 1 View Result Date: 09/08/2023 CLINICAL DATA:  NG tube placement EXAM: ABDOMEN - 1 VIEW COMPARISON:  09/07/2023 FINDINGS: Enteric tube has been retracted, side-port in the region of the distal esophagus. Multiple dilated loops of small bowel. IMPRESSION:  Enteric tube has been retracted, side-port in the region of the distal esophagus. Recommend advancement by about 10 cm for more optimal positioning Electronically Signed   By: Esmeralda Hedge M.D.   On: 09/08/2023 23:47       Feliciana Horn M.D. [facility] 09/10/2023, 3:17 PM  Available via Epic secure chat 7am-7pm After 7 pm, please refer to night coverage provider listed on amion.

## 2023-09-10 NOTE — Plan of Care (Signed)
  Problem: Fluid Volume: Goal: Ability to maintain a balanced intake and output will improve Outcome: Progressing   Problem: Skin Integrity: Goal: Risk for impaired skin integrity will decrease Outcome: Progressing   Problem: Clinical Measurements: Goal: Respiratory complications will improve Outcome: Progressing   Problem: Activity: Goal: Risk for activity intolerance will decrease Outcome: Progressing   Problem: Elimination: Goal: Will not experience complications related to bowel motility Outcome: Progressing   Problem: Pain Managment: Goal: General experience of comfort will improve and/or be controlled Outcome: Progressing   Problem: Safety: Goal: Ability to remain free from injury will improve Outcome: Progressing   Problem: Skin Integrity: Goal: Risk for impaired skin integrity will decrease Outcome: Progressing

## 2023-09-10 NOTE — Progress Notes (Signed)
 3 Days Post-Op   Subjective/Chief Complaint: Ongoing issues with NG overnight, per RN report.  Pt reports increased RLQ abdominal pain. Has been having flatus.  Objective: Vital signs in last 24 hours: Temp:  [97.8 F (36.6 C)-99.1 F (37.3 C)] 98.2 F (36.8 C) (05/16 1251) Pulse Rate:  [99-120] 100 (05/16 1251) Resp:  [16-26] 18 (05/16 1251) BP: (85-137)/(46-86) 131/70 (05/16 1251) SpO2:  [98 %-100 %] 100 % (05/16 1251) Weight:  [90.9 kg-91.9 kg] 90.9 kg (05/15 1819) Last BM Date : 09/08/23  Intake/Output from previous day: 05/15 0701 - 05/16 0700 In: 334 [I.V.:334] Out: 1365 [Urine:350; Drains:15] Intake/Output this shift: No intake/output data recorded.  Central line R internal jugular with blood stained dressing Ab distended, protunerant, LLQ drain with dark bloody drainage, RLQ tenderness without peritonitis   NG clamped - I flushed it and placed to suction with immediate return of 400 mL thick bilious fluid.  Lab Results:  Recent Labs    09/09/23 1535 09/10/23 1049  WBC 15.1* 13.8*  HGB 8.3* 8.5*  HCT 25.9* 25.7*  PLT 402* 411*   BMET Recent Labs    09/10/23 1049 09/10/23 1050  NA 139 139  K 3.1* 3.1*  CL 94* 94*  CO2 30 29  GLUCOSE 196* 195*  BUN 67* 67*  CREATININE 5.28* 5.32*  CALCIUM  8.4* 8.4*   PT/INR No results for input(s): "LABPROT", "INR" in the last 72 hours. ABG No results for input(s): "PHART", "HCO3" in the last 72 hours.  Invalid input(s): "PCO2", "PO2"  Studies/Results: DG Abd 1 View Result Date: 09/10/2023 CLINICAL DATA:  NG tube placement. EXAM: ABDOMEN - 1 VIEW COMPARISON:  09/09/2023 FINDINGS: NG tube tip is in the stomach with proximal side port below the GE junction. Diffuse gaseous small bowel distension identified with gas dilated loop in the left abdomen measuring up to 5.6 cm diameter. IMPRESSION: 1. NG tube tip is in the stomach. 2. Diffuse gaseous small bowel dilatation within the visualized upper abdomen. Electronically  Signed   By: Donnal Fusi M.D.   On: 09/10/2023 07:29   DG Abd 1 View Result Date: 09/09/2023 CLINICAL DATA:  Nasogastric tube placement. EXAM: ABDOMEN - 1 VIEW COMPARISON:  None Available. FINDINGS: The tip and side port of the enteric tube are below the diaphragm in the stomach. The tube is looped in the mid stomach with the tip directed cranially. IMPRESSION: Enteric tube looped in the mid stomach with the tip directed cranially. Electronically Signed   By: Chadwick Colonel M.D.   On: 09/09/2023 20:49   DG Abd Portable 1V Result Date: 09/09/2023 CLINICAL DATA:  Dislodged gastrostomy tube. EXAM: PORTABLE ABDOMEN - 1 VIEW COMPARISON:  CT abdomen pelvis dated 09/08/2023. FINDINGS: Enteric tube extends into the distal stomach and fold back on itself with tip in the proximal thigh. Diffusely dilated air-filled loops of small bowel measure up to 5 cm. No acute osseous pathology. IMPRESSION: 1. Enteric tube extends into the distal stomach and fold back on itself with tip in the proximal thigh. 2. Diffusely dilated air-filled loops of small bowel. Electronically Signed   By: Angus Bark M.D.   On: 09/09/2023 16:11   DG Chest Port 1 View Result Date: 09/09/2023 CLINICAL DATA:  Abdominal pain. EXAM: PORTABLE CHEST 1 VIEW COMPARISON:  09/07/2023 FINDINGS: The cardio pericardial silhouette is enlarged. Bibasilar atelectasis/infiltrate again noted with small bilateral pleural effusions. There is pulmonary vascular congestion without overt pulmonary edema. The NG tube passes into the stomach although the distal  tip position is not included on the film. Right IJ central line tip overlies the proximal SVC level. Telemetry leads overlie the chest. IMPRESSION: 1. Bibasilar atelectasis/infiltrate with small bilateral pleural effusions. 2. Pulmonary vascular congestion without overt pulmonary edema. Electronically Signed   By: Donnal Fusi M.D.   On: 09/09/2023 07:25   DG Abd 1 View Result Date:  09/08/2023 CLINICAL DATA:  NG tube placement EXAM: ABDOMEN - 1 VIEW COMPARISON:  09/07/2023 FINDINGS: Enteric tube has been retracted, side-port in the region of the distal esophagus. Multiple dilated loops of small bowel. IMPRESSION: Enteric tube has been retracted, side-port in the region of the distal esophagus. Recommend advancement by about 10 cm for more optimal positioning Electronically Signed   By: Esmeralda Hedge M.D.   On: 09/08/2023 23:47   CT ABDOMEN PELVIS W CONTRAST Result Date: 09/08/2023 CLINICAL DATA:  Abdominal pain. Postop day 8 for laparoscopic appendectomy. EXAM: CT ABDOMEN AND PELVIS WITH CONTRAST TECHNIQUE: Multidetector CT imaging of the abdomen and pelvis was performed using the standard protocol following bolus administration of intravenous contrast. RADIATION DOSE REDUCTION: This exam was performed according to the departmental dose-optimization program which includes automated exposure control, adjustment of the mA and/or kV according to patient size and/or use of iterative reconstruction technique. CONTRAST:  75mL OMNIPAQUE  IOHEXOL  350 MG/ML SOLN COMPARISON:  08/30/2023 and 09/02/2023 FINDINGS: Lower chest: Ventral hernia containing mediastinal and likely upper abdominal adipose tissues extending through a lower sternal defect with abnormal stranding within the herniated adipose tissues and abnormal stranding in the mediastinal adipose tissue anterior to the pericardium as on image 2 series 3. Moderate right and small left pleural effusions with pleural enhancement and passive atelectasis. Calcified pleural plaques at the lung bases. Rounded atelectasis in the left lower lobe and scarring in the lingula. Circumflex and right coronary artery atherosclerosis along with descending thoracic aortic atherosclerosis. Hepatobiliary: Dependent density in the gallbladder compatible with either vicarious excretion of contrast (favored) or sludge. No significant hepatic parenchymal lesion  identified. No biliary dilatation. Pancreas: Unremarkable Spleen: Hypodense partially exophytic posterior splenic mass measures 1.7 cm in diameter with portal venous phase density 64 Hounsfield units and delayed phase density 75 Hounsfield units, this may be a hemangioma but is technically nonspecific. Adrenals/Urinary Tract: Stable substantial adrenal thickening bilaterally compatible with hyperplasia. Stable benign renal cysts warranting no further imaging workup. Foley catheter noted in the urinary bladder which is otherwise empty. Stomach/Bowel: Nasogastric tube tip extends through the stomach and terminates in the descending duodenum. Scattered sigmoid colon diverticula. A drain is present terminating in the right lower quadrant and the drain terminates within a complex 5.5 by 11.7 by 4.3 cm (volume = 140 cm^3) collection immediately adjacent to a loop of small bowel in the right lower quadrant, and extending up adjacent to the appendectomy site. This complex collection was smaller on the prior exam. The contrast medium in the terminal ileum and cecum is of higher density than the complex fluid in this collection, although strictly speaking a leak containing only a small amount of diluted contrast is not totally excluded. This could alternatively be an abscess but the poor response to the drain is of uncertain significance, correlate with chronic door aspirate from drainage. Is the drain fluid bloody or purulent? Substantially reduced complex fluid in the left paracolic gutter compared to previous. Small amount of complex fluid in the pelvis, favoring hemoperitoneum. Scattered loops of mildly dilated small bowel up to about 3.5 cm in diameter, potentially from postoperative ileus, correlate  with return of bowel function after surgery. There is likely some complex fluid along the right lower quadrant omentum for example on image 68 series 3. Some of the orally administered contrast extends around all the way to  the rectum. Small amount of complex fluid adjacent to the sigmoid colon. Vascular/Lymphatic: Atherosclerosis is present, including aortoiliac atherosclerotic disease. Reproductive: Dorsal penoscrotal edema. Other: Previous small amount of free intraperitoneal gas has resolved. Musculoskeletal: Lower sternal defect as noted above. 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 Lumbar spondylosis and degenerative disc disease. Suspected right foraminal impingement at L2-3 and left foraminal impingement at L4-5. IMPRESSION: 1. The drain terminates within a complex 5.5 by 11.7 by 4.3 cm (volume = 140 cc) collection immediately adjacent to a loop of small bowel in the right lower quadrant, and extending up adjacent to the appendectomy site. This collection was smaller on the prior exam. Is the drain fluid bloody or purulent? There are other sites of complex fluid in the pelvis and adjacent to the sigmoid colon possibly from hemoperitoneum, and reduced complex fluid along the left paracolic gutter compared to previous. 2. Scattered loops of mildly dilated small bowel up to about 3.5 cm in diameter, potentially from postoperative ileus, correlate with return of bowel function after surgery. 3. Moderate right and small left pleural effusions with pleural enhancement and passive atelectasis. 4. Ventral hernia containing mediastinal and likely upper abdominal adipose tissues extending through a lower sternal defect with abnormal stranding within the herniated adipose tissues and abnormal stranding in the mediastinal adipose tissue anterior to the pericardium. 5. Rounded atelectasis in the left lower lobe and scarring in the lingula. 6. Dependent density in the gallbladder compatible with either vicarious excretion of contrast (favored) or sludge. 7. 1.7 cm hypodense partially exophytic posterior splenic mass, this may be a hemangioma but is technically nonspecific. 8. Lumbar spondylosis and  degenerative disc disease with suspected right foraminal impingement at L2-3 and left foraminal impingement at L4-5. 9. Mild dorsal penoscrotal edema. 10.  Aortic Atherosclerosis (ICD10-I70.0). Electronically Signed   By: Freida Jes M.D.   On: 09/08/2023 14:11    Anti-infectives: Anti-infectives (From admission, onward)    Start     Dose/Rate Route Frequency Ordered Stop   08/31/23 2200  piperacillin -tazobactam (ZOSYN ) IVPB 2.25 g        2.25 g 100 mL/hr over 30 Minutes Intravenous Every 8 hours 08/31/23 1953     08/31/23 0600  piperacillin -tazobactam (ZOSYN ) IVPB 3.375 g  Status:  Discontinued        3.375 g 100 mL/hr over 30 Minutes Intravenous Every 8 hours 08/31/23 0532 08/31/23 1953   08/30/23 2245  piperacillin -tazobactam (ZOSYN ) IVPB 3.375 g        3.375 g 100 mL/hr over 30 Minutes Intravenous  Once 08/30/23 2233 08/31/23 0005       Assessment/Plan: POD 10 - s/p lap appy with JP drain placement for perforated appendicitis [name] 5/6  - persistent ileus, continue NG tube to LIWS, now bridled. Side port of NG appears to be coiled up in the proximal stomach. It is functional today but if there are issues we will consider retracting and resecuring with bridle.  - CT abd pelvis 5/14 shows JP within a hematoma, post-op ileus with dilated small bowel, bilateral R>L pleural effusions.  - dark sanguinous output from JP drain, Hgb 8.5, monitor - encourage OOB, ambulation   FEN - NPO, TPN started 5/14 Lines/tubes - temp HD cath R internal  jugular, PIV x2 RUE, LUE w/ AV Fistula (non-functional), NGT to be replaced, LLE JP drain Foley - in placed, clear yellow urine VTE - heparin /plavix  on hold ID - Zosyn ; WBC 16 yesterday  Charlott Converse 09/10/2023

## 2023-09-10 NOTE — TOC Progression Note (Signed)
 Transition of Care [facility]) - Progression Note    Patient Details  Name: Christian [name] MRN: [id number] Date of Birth: 1946/02/13  [facility]) CM/SW Contact  Tandy Fam, [place] Phone Number: 09/10/2023, 1:43 PM  Clinical Narrative:   CSW attempted to speak with patient's spouse about SNF options and check in for questions, left a voicemail asking for call back. Patient not medically stable for discharge at this time, CSW will follow.    Expected Discharge Plan: Skilled Nursing Facility Barriers to Discharge: Continued Medical Work up  Expected Discharge Plan and Services                                               Social Determinants of Health (SDOH) Interventions SDOH Screenings   Food Insecurity: No Food Insecurity (08/31/2023)  Housing: Low Risk  (08/31/2023)  Transportation Needs: No Transportation Needs (08/31/2023)  Utilities: Not At Risk (08/31/2023)  Alcohol Screen: Low Risk  (07/23/2021)  Depression (PHQ2-9): Low Risk  (01/18/2023)  Financial Resource Strain: Low Risk  (07/23/2021)  Physical Activity: Inactive (07/23/2021)  Social Connections: Moderately Integrated (08/31/2023)  Stress: No Stress Concern Present (07/23/2021)  Tobacco Use: Medium Risk (08/31/2023)    Readmission Risk Interventions     No data to display

## 2023-09-10 NOTE — Progress Notes (Signed)
 Physical Therapy Treatment Patient Details Name: Christian [name] MRN: [id number] DOB: 12-08-1945 Today's Date: 09/10/2023   History of Present Illness 78 y.o. male admitted 08/30/23 with RLQ pain, appendicitis and perforated appendix s/p laparoscopic appendectomy 5/6. Post op ileus. PMHx: CAD s/p CABG, HTN, CKD, DMT2, BPH, multiple sclerosis, neuropathy, mild dementia    PT Comments  Pt is very slowly progressing towards goals. Pt is currently Mod A for bed mobility and 2 person Mod to Max A for sit to stand. Pt has a very high fear of falling and attempts to start sitting back down as soon as he stands up despite max attempts at cues and encouragement. Due to pt current functional status, home set up and available assistance at home recommending skilled physical therapy services < 3 hours/day in order to address strength, balance and functional mobility to decrease risk for falls, injury, immobility, skin break down and re-hospitalization.      If plan is discharge home, recommend the following: A lot of help with walking and/or transfers;Assistance with cooking/housework;Assist for transportation;Help with stairs or ramp for entrance;Supervision due to cognitive status   Can travel by private vehicle     No  Equipment Recommendations  Hospital bed;Hoyer lift       Precautions / Restrictions Precautions Precautions: Fall Recall of Precautions/Restrictions: Impaired Precaution/Restrictions Comments: abdominal incision, JP drain Restrictions Weight Bearing Restrictions Per Provider Order: No     Mobility  Bed Mobility Overal bed mobility: Needs Assistance Bed Mobility: Rolling, Sidelying to Sit, Sit to Supine Rolling: Mod assist Sidelying to sit: Mod assist   Sit to supine: Max assist, +2 for physical assistance   General bed mobility comments: Mod A with tactile cues for initiation for rolling and LE to EOB. 2 person Max A for sitting to supine.    Transfers Overall transfer  level: Needs assistance Equipment used: 2 person hand held assist Transfers: Sit to/from Stand Sit to Stand: +2 physical assistance, Max assist, Mod assist, +2 safety/equipment           General transfer comment: Pt has a high fear of falling. When asked if he is nervous he shakes head "no" pt would stand for less than 5 seconds before going to sit back down on EOB. Max A +2 for initial sit to stand and Mod A +2 for second attempt. Use of bed pad on initial attempt and HHA +Ue support for second attempt    Ambulation/Gait     General Gait Details: unable at this time.      Balance Overall balance assessment: Needs assistance Sitting-balance support: Feet supported, Bilateral upper extremity supported Sitting balance-Leahy Scale: Fair Sitting balance - Comments: Pt able to sit EOB without support     Standing balance-Leahy Scale: Zero Standing balance comment: pt able to stand for ~ 5 seconds before trying to sit back down.      Communication Communication Communication: Impaired Factors Affecting Communication: Difficulty expressing self;Reduced clarity of speech (hypophonic)  Cognition Arousal: Alert Behavior During Therapy: Flat affect   PT - Cognitive impairments: No family/caregiver present to determine baseline, Problem solving, Safety/Judgement, Awareness, Initiation, Sequencing       PT - Cognition Comments: Pt mostly nonverbal during session. Shaking head yes/no to questions. Slow to initiate requiring cues. Following commands: Impaired Following commands impaired: Follows one step commands with increased time, Follows one step commands inconsistently    Cueing Cueing Techniques: Verbal cues, Tactile cues, Gestural cues     General Comments General comments (  skin integrity, edema, etc.): HR up to 122 bpm during session.      Pertinent Vitals/Pain Pain Assessment Pain Assessment: Faces Faces Pain Scale: Hurts a little bit Breathing: normal Negative  Vocalization: none Facial Expression: smiling or inexpressive Body Language: relaxed Consolability: no need to console PAINAD Score: 0 Pain Location: left flank Pain Descriptors / Indicators: Sore Pain Intervention(s): Monitored during session     PT Goals (current goals can now be found in the care plan section) Acute Rehab PT Goals Patient Stated Goal: to have less pain when moving PT Goal Formulation: With patient/family Time For Goal Achievement: 09/16/23 Potential to Achieve Goals: Good Progress towards PT goals: Progressing toward goals    Frequency    Min 2X/week      PT Plan  Continue with current POC        AM-PAC PT "6 Clicks" Mobility   Outcome Measure  Help needed turning from your back to your side while in a flat bed without using bedrails?: A Lot Help needed moving from lying on your back to sitting on the side of a flat bed without using bedrails?: Total Help needed moving to and from a bed to a chair (including a wheelchair)?: Total Help needed standing up from a chair using your arms (e.g., wheelchair or bedside chair)?: Total Help needed to walk in hospital room?: Total Help needed climbing 3-5 steps with a railing? : Total 6 Click Score: 7    End of Session   Activity Tolerance: Patient tolerated treatment well;Patient limited by fatigue Patient left: in bed;with call bell/phone within reach;with bed alarm set Nurse Communication: Mobility status PT Visit Diagnosis: Unsteadiness on feet (R26.81);Other abnormalities of gait and mobility (R26.89);Muscle weakness (generalized) (M62.81)     Time: 1140-1157 PT Time Calculation (min) (ACUTE ONLY): 17 min  Charges:    $Therapeutic Activity: 8-22 mins PT General Charges $$ ACUTE PT VISIT: 1 Visit                     Sloan Duncans, DPT, CLT  Acute Rehabilitation Services Office: [phone] (Secure chat preferred)    Jenice Mitts 09/10/2023, 12:12 PM

## 2023-09-10 NOTE — Progress Notes (Signed)
 PHARMACY - TOTAL PARENTERAL NUTRITION CONSULT NOTE   Indication: Prolonged ileus  Patient Measurements: Height: 5\' 9"  (175.3 cm) Weight: 90.9 kg (200 lb 6.4 oz) IBW/kg (Calculated) : 70.7 TPN AdjBW (KG): 78.4 Body mass index is 29.59 kg/m. Usual Weight: 206-215lb per chart review and wife  Assessment:  78 yo M with acute appendicitis s/p appendectomy with persistent ileus. 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 Patient with little to no intake since admission (9 days) due to procedures and bloody drain output. Patient may be a little confused right now per RN. Spoke with Wife who states patient eats a variety of foods (vegetable soup with beef, hamburgers, fries etc) 5-6 times a day at home. Last good meal was 5/3 prior to admission. Pharmacy consulted for TPN.   Glucose / Insulin : BG 177-246 since TPN hung, A1C 7.3%, used 12 units SSI/24hr and 12 units in TPN  Home med- glimepiride  Electrolytes: K 3.4 (Received K 30 mEq IV), phos 6.9, others wnl Renal: New HD- last 5/15> next 5/17, BUN 90 Hepatic: Tbili 2.1, others wnl, albumin  1.7, TG 137  Intake / Output; MIVF: UOP charted, drain 15mL, LBM 5/10 GI Imaging: 5/5 CT: acute appendicitis with possible small abscess  5/8 CT: drain in place, mild SB distension likely mild ileus  5/9 KUB: increased gas in SB and colon, likely adynamic ileus or early pSBO 5/11 KUB: Gas distended small bowel throughout the abdomen, unchanged.  5/12 KUB: Diffuse gaseous distention of bowel loops in the abdomen, SBO vs ileus  5/13 KUB:  Mild diffuse gaseous dilatation of large and small bowel without significant change 5/14 CT: Scattered loops of mildly dilated small bowel, possibly postop ileus  5/14 KUB: Multiple dilated loops of small bowel.  GI Surgeries / Procedures:  5/6 laparoscopic appendectomy with JP drain 5/12 NGT 5/14 replace NGT 5/15 replace NGT  Central access: CVC 5/14 TPN start date: 5/14   Nutritional Goals: Goal  Concentrated TPN rate is 70 mL/hr (provides 116 g of protein and 2192 kcals per day)  RD Assessment: Estimated Needs Total Energy Estimated Needs: 2150-2350 Total Protein Estimated Needs: 115-135 grams Total Fluid Estimated Needs: UOP + 1L  Current Nutrition:  NPO+ TPN  Plan:  Advance Concentrated TPN to goal 70 mL/hr at 1800, meeting 100% of estimated needs Electrolytes in TPN: Na 75 mEq/L, increase K 12 mEq/L, Ca 0 mEq/L, Mg 0 mEq/L, and Phos 0 mmol/L. Cl:Ac max acetate   Give IV Kcl 40 mEq  Add standard MVI and trace elements to TPN Continue Sensitive q4h SSI and adjust as needed   Increase insulin  20 units to TPN  Monitor TPN labs daily until stable at goal then on Mon/Thurs   Dorene Gang, PharmD, BCPS, [facility] Clinical Pharmacist  Please check AMION for all [facility] Pharmacy phone numbers After 10:00 PM, call Main Pharmacy [phone]

## 2023-09-11 DIAGNOSIS — N179 Acute kidney failure, unspecified: Secondary | ICD-10-CM | POA: Diagnosis not present

## 2023-09-11 DIAGNOSIS — K37 Unspecified appendicitis: Secondary | ICD-10-CM | POA: Diagnosis not present

## 2023-09-11 DIAGNOSIS — I159 Secondary hypertension, unspecified: Secondary | ICD-10-CM | POA: Diagnosis not present

## 2023-09-11 DIAGNOSIS — D62 Acute posthemorrhagic anemia: Secondary | ICD-10-CM | POA: Diagnosis not present

## 2023-09-11 LAB — CBC WITH DIFFERENTIAL/PLATELET
Abs Immature Granulocytes: 0.58 10*3/uL — ABNORMAL HIGH (ref 0.00–0.07)
Basophils Absolute: 0.1 10*3/uL (ref 0.0–0.1)
Basophils Relative: 1 %
Eosinophils Absolute: 0.1 10*3/uL (ref 0.0–0.5)
Eosinophils Relative: 1 %
HCT: 25.6 % — ABNORMAL LOW (ref 39.0–52.0)
Hemoglobin: 8.4 g/dL — ABNORMAL LOW (ref 13.0–17.0)
Immature Granulocytes: 5 %
Lymphocytes Relative: 7 %
Lymphs Abs: 0.8 10*3/uL (ref 0.7–4.0)
MCH: 33.1 pg (ref 26.0–34.0)
MCHC: 32.8 g/dL (ref 30.0–36.0)
MCV: 100.8 fL — ABNORMAL HIGH (ref 80.0–100.0)
Monocytes Absolute: 0.7 10*3/uL (ref 0.1–1.0)
Monocytes Relative: 5 %
Neutro Abs: 10.3 10*3/uL — ABNORMAL HIGH (ref 1.7–7.7)
Neutrophils Relative %: 81 %
Platelets: 420 10*3/uL — ABNORMAL HIGH (ref 150–400)
RBC: 2.54 MIL/uL — ABNORMAL LOW (ref 4.22–5.81)
RDW: 15.8 % — ABNORMAL HIGH (ref 11.5–15.5)
WBC: 12.6 10*3/uL — ABNORMAL HIGH (ref 4.0–10.5)
nRBC: 0 % (ref 0.0–0.2)

## 2023-09-11 LAB — BASIC METABOLIC PANEL WITH GFR
Anion gap: 19 — ABNORMAL HIGH (ref 5–15)
BUN: 91 mg/dL — ABNORMAL HIGH (ref 8–23)
CO2: 29 mmol/L (ref 22–32)
Calcium: 8.4 mg/dL — ABNORMAL LOW (ref 8.9–10.3)
Chloride: 90 mmol/L — ABNORMAL LOW (ref 98–111)
Creatinine, Ser: 5.86 mg/dL — ABNORMAL HIGH (ref 0.61–1.24)
GFR, Estimated: 9 mL/min — ABNORMAL LOW (ref >60)
Glucose, Bld: 239 mg/dL — ABNORMAL HIGH (ref 70–99)
Potassium: 3.2 mmol/L — ABNORMAL LOW (ref 3.5–5.1)
Sodium: 138 mmol/L (ref 135–145)

## 2023-09-11 LAB — CBC
HCT: 23.5 % — ABNORMAL LOW (ref 39.0–52.0)
Hemoglobin: 7.5 g/dL — ABNORMAL LOW (ref 13.0–17.0)
MCH: 32.3 pg (ref 26.0–34.0)
MCHC: 31.9 g/dL (ref 30.0–36.0)
MCV: 101.3 fL — ABNORMAL HIGH (ref 80.0–100.0)
Platelets: 379 10*3/uL (ref 150–400)
RBC: 2.32 MIL/uL — ABNORMAL LOW (ref 4.22–5.81)
RDW: 15.7 % — ABNORMAL HIGH (ref 11.5–15.5)
WBC: 11.9 10*3/uL — ABNORMAL HIGH (ref 4.0–10.5)
nRBC: 0 % (ref 0.0–0.2)

## 2023-09-11 LAB — GLUCOSE, CAPILLARY
Glucose-Capillary: 212 mg/dL — ABNORMAL HIGH (ref 70–99)
Glucose-Capillary: 233 mg/dL — ABNORMAL HIGH (ref 70–99)
Glucose-Capillary: 226 mg/dL — ABNORMAL HIGH (ref 70–99)
Glucose-Capillary: 219 mg/dL — ABNORMAL HIGH (ref 70–99)

## 2023-09-11 LAB — PHOSPHORUS: Phosphorus: 3.9 mg/dL (ref 2.5–4.6)

## 2023-09-11 LAB — MAGNESIUM: Magnesium: 2.2 mg/dL (ref 1.7–2.4)

## 2023-09-11 MED ORDER — PENTAFLUOROPROP-TETRAFLUOROETH EX AERO: 1.0000 | INHALATION_SPRAY | CUTANEOUS | Status: DC | PRN

## 2023-09-11 MED ORDER — INSULIN GLARGINE-YFGN 100 UNIT/ML ~~LOC~~ SOLN
8.0000 [IU] | SUBCUTANEOUS | Status: DC
  Administered 2023-09-11: 8 [IU] via SUBCUTANEOUS
  Filled 2023-09-11 (×3): qty 0.08

## 2023-09-11 MED ORDER — ALTEPLASE 2 MG IJ SOLR: 2.0000 mg | Freq: Once | INTRAMUSCULAR | Status: DC | PRN

## 2023-09-11 MED ORDER — TRACE MINERALS CU-MN-SE-ZN 300-55-60-3000 MCG/ML IV SOLN
INTRAVENOUS | Status: AC
  Filled 2023-09-11: qty 772.8

## 2023-09-11 MED ORDER — INSULIN ASPART 100 UNIT/ML IJ SOLN
0.0000 [IU] | INTRAMUSCULAR | Status: DC
  Administered 2023-09-11 – 2023-09-12 (×5): 5 [IU] via SUBCUTANEOUS

## 2023-09-11 MED ORDER — HEPARIN SODIUM (PORCINE) 1000 UNIT/ML DIALYSIS: 1000.0000 [IU] | INTRAMUSCULAR | Status: DC | PRN

## 2023-09-11 MED ORDER — LIDOCAINE HCL (PF) 1 % IJ SOLN: 5.0000 mL | INTRAMUSCULAR | Status: DC | PRN

## 2023-09-11 MED ORDER — LIDOCAINE-PRILOCAINE 2.5-2.5 % EX CREA: 1.0000 | TOPICAL_CREAM | CUTANEOUS | Status: DC | PRN

## 2023-09-11 MED ORDER — ANTICOAGULANT SODIUM CITRATE 4% (200MG/5ML) IV SOLN: 5.0000 mL | Status: DC | PRN

## 2023-09-11 MED ORDER — POTASSIUM CHLORIDE 10 MEQ/100ML IV SOLN
10.0000 meq | INTRAVENOUS | Status: AC
  Administered 2023-09-11 (×3): 10 meq via INTRAVENOUS
  Filled 2023-09-11 (×4): qty 100

## 2023-09-11 MED ORDER — POTASSIUM CHLORIDE 10 MEQ/100ML IV SOLN
10.0000 meq | Freq: Once | INTRAVENOUS | Status: AC
Start: 2023-09-11 — End: 2023-09-11
  Administered 2023-09-11: 10 meq via INTRAVENOUS

## 2023-09-11 MED ORDER — POTASSIUM CHLORIDE 10 MEQ/50ML IV SOLN
10.0000 meq | INTRAVENOUS | Status: DC
Start: 2023-09-11 — End: 2023-09-11
  Administered 2023-09-11: 10 meq via INTRAVENOUS
  Filled 2023-09-11 (×5): qty 50

## 2023-09-11 NOTE — Progress Notes (Signed)
 PHARMACY - TOTAL PARENTERAL NUTRITION CONSULT NOTE   Indication: Prolonged ileus  Patient Measurements: Height: 5\' 9"  (175.3 cm) Weight: 89.6 kg (197 lb 8.5 oz) IBW/kg (Calculated) : 70.7 TPN AdjBW (KG): 78.4 Body mass index is 29.17 kg/m. Usual Weight: 206-215lb per chart review and wife  Assessment:  78 yo M with acute appendicitis s/p appendectomy with persistent ileus. 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 Patient with little to no intake since admission (9 days) due to procedures and bloody drain output. Patient may be a little confused right now per RN. Spoke with Wife who states patient eats a variety of foods (vegetable soup with beef, hamburgers, fries etc) 5-6 times a day at home. Last good meal was 5/3 prior to admission. Pharmacy consulted for TPN.   Glucose / Insulin : BG 212-233 since TPN hung, A1C 7.3%, used 16 units SSI/24hr and 20 units in TPN  Home med- glimepiride  Electrolytes: K 3.2 (Received K 40 mEq IV), Phos 3.9 down, CoCa 10.2 (none in TPN), others wnl Renal: New HD- last 5/15> next 5/17, BUN 91 Hepatic: wnl, albumin  1.7, TG 137  Intake / Output; MIVF: UOP charted, NGT , drain (RN thinks misdocumented), LBM 5/10, flatus + 5/17 GI Imaging: 5/5 CT: acute appendicitis with possible small abscess  5/8 CT: drain in place, mild SB distension likely mild ileus  5/9 KUB: increased gas in SB and colon, likely adynamic ileus or early pSBO 5/11 KUB: Gas distended small bowel throughout the abdomen, unchanged.  5/12 KUB: Diffuse gaseous distention of bowel loops in the abdomen, SBO vs ileus  5/13 KUB:  Mild diffuse gaseous dilatation of large and small bowel without significant change 5/14 CT: Scattered loops of mildly dilated small bowel, possibly postop ileus  5/14 KUB: Multiple dilated loops of small bowel.  5/16 KUB: Diffuse gaseous SB dilatation within the visualized upper abdomen GI Surgeries / Procedures:  5/6 laparoscopic appendectomy  with JP drain 5/12 NGT 5/14 replace NGT 5/15 replace NGT  Central access: CVC 5/14 TPN start date: 5/14   Nutritional Goals: Goal Concentrated TPN rate is 70 mL/hr (provides 116 g of protein and 2192 kcals per day)  RD Assessment: Estimated Needs Total Energy Estimated Needs: 2150-2350 Total Protein Estimated Needs: 115-135 grams Total Fluid Estimated Needs: UOP + 1L  Current Nutrition:  NPO+ TPN  Plan:  Continue concentrated TPN at goal 70 mL/hr at 1800, meeting 100% of estimated needs Electrolytes in TPN: Na 75 mEq/L, increase K 24 mEq/L, Ca 0 mEq/L, Mg 0 mEq/L, and Phos 0 mmol/L. Cl:Ac 1:1 Give IV KCl 50 mEq  Add standard MVI and trace elements to TPN Increase Moderate q4h SSI and adjust as needed   Increase insulin  30 units to TPN  Monitor TPN labs daily until stable at goal then on Mon/Thurs  Thank you for involving pharmacy in this patient's care.  Caroline Cinnamon, PharmD, BCPS Clinical Pharmacist Clinical phone for 09/11/2023 is [phone] 09/11/2023 7:13 AM

## 2023-09-11 NOTE — Progress Notes (Signed)
 [facility]                                                                               Christian [name], is a 78 y.o. male, DOB - [birth year], ONG:[id number] Admit date - 08/30/2023    Outpatient Primary MD for the patient is [name], MD  LOS - 11  days    Brief summary    78 y.o. yo male with CAD status post CABG, postoperative CVA and renal failure , underwent laparoscopic appendectomy for perforated appendicitis by Dr Aniceto Barley on 5/6 developed postop ileus, s/p JP drain and has a NG tube on suction,  AKI on CKD stage 4, currently on HD. Patient had a old AV fistula , was unable to cannulate , underwent RIJ temp HD cath by IR on 5/14.  Recent CT abd and pelvis on 5/14 shows JP drain with a hematoma, with persistent post op ileus with dilated small bowel.   Assessment & Plan    Assessment and Plan:  Appendicitis with possible periappendiceal abscess Post op ileus - s/p lap appendectomy on 5/6 - continue zosyn  as per general surgery. Day 11 of IV zosyn .  - drain remains in place with dark sanguinous output .  - Poor intake and no sign of ileus improving.  Surgery recommending TPN which was started on 5/14. - patient underwent Placement of a right IJ approach triple lumen temp HD cath  on 5/14 as putting in a tunneled HD catheter at this time with leukocytosis might be risky and more prone to infection.  - continue NGT for decompression ,pain controlled.  - gen surgery, IR, vascular surgery on board.  - CT abdomen and pelvis shows complex 5.5 by 11.7 by 4.3 cm collection adjacent to the appendectomy site. - continue with TPN. Patient remains afebrile and leukocytosis improving.    Acute on CKDIV ATN - patient has history of CKD4. Baseline creat ~ 3.2 - 3.5, eGFR~ 17 - he has required HD the past - possibly volume depletion from surgery/blood loss and when he had severe hypoTN on 5/7; I don't think due to zosyn  but AIN always a possibility as well  -  seems to be ATN with uptrending creat/BUN; has been slowly approaching need for dialysis - Nephrology on board.  - Vascular surgery consulted; appreciate assistance.  Fistulogram performed 09/07/2023 and balloon venoplasty performed and left arm fistula ready for use.   Unfortunately left arm fistula cannot be cannulated at this time.  - underwent RIJ temp HD cath by IR on 5/14.  - undergoing HD now.  - no signs of renal recovery so far.  - further management as per nephrology.     ABLA Hemoglobin  dropped from 10 to 8.5 , transfuse to keep hemoglobin greater than 8.  Repeat hemoglobin is 8.4.     HTN Optimal.    Type 2 DM; well controlled CBG'S CBG (last 3)  Recent Labs    09/11/23 0319 09/11/23 1115 09/11/23 1546  GLUCAP 212* 233* 226*   Resume SSI.start 8 units of semglee for better cbg control.    CAD - s/p CABG - cleared by cardiology prior to surgery  Thrombocytosis Monitor.     Hypokalemia  Replaced. Repeat in am.     Malnutrition Type:  Nutrition Problem: Inadequate oral intake Etiology: inability to eat   Malnutrition Characteristics:  Signs/Symptoms: NPO status   Nutrition Interventions:  Interventions: TPN  Estimated body mass index is 29.17 kg/m as calculated from the following:   Height as of this encounter: 5\' 9"  (1.753 m).   Weight as of this encounter: 89.6 kg.  Code Status: full code.  DVT Prophylaxis:  heparin  injection 5,000 Units Start: 09/09/23 1400 Place and maintain sequential compression device Start: 09/01/23 2001 SCDs Start: 08/31/23 0415   Level of Care: Level of care: Progressive Family Communication: None at bedside.   Disposition Plan:     Remains inpatient appropriate:  Pending clinical improvement.    Procedures:  Lap appendectomy.   Consultants:   General surgery Nephrology Vascular surgery.  IR.   Antimicrobials:   Anti-infectives (From admission, onward)    Start     Dose/Rate Route Frequency Ordered  Stop   08/31/23 2200  piperacillin -tazobactam (ZOSYN ) IVPB 2.25 g        2.25 g 100 mL/hr over 30 Minutes Intravenous Every 8 hours 08/31/23 1953     08/31/23 0600  piperacillin -tazobactam (ZOSYN ) IVPB 3.375 g  Status:  Discontinued        3.375 g 100 mL/hr over 30 Minutes Intravenous Every 8 hours 08/31/23 0532 08/31/23 1953   08/30/23 2245  piperacillin -tazobactam (ZOSYN ) IVPB 3.375 g        3.375 g 100 mL/hr over 30 Minutes Intravenous  Once 08/30/23 2233 08/31/23 0005        Medications  Scheduled Meds:  Chlorhexidine  Gluconate Cloth  6 each Topical Q0600   Chlorhexidine  Gluconate Cloth  6 each Topical Q0600   fluticasone  furoate-vilanterol  1 puff Inhalation Daily   heparin  injection (subcutaneous)  5,000 Units Subcutaneous Q8H   insulin  aspart  0-15 Units Subcutaneous Q4H   metoprolol  tartrate  5 mg Intravenous Q6H   sevelamer  carbonate  0.8 g Oral TID WC   sodium chloride  flush  3 mL Intravenous Q12H   Continuous Infusions:  piperacillin -tazobactam (ZOSYN )  IV 2.25 g (09/11/23 1428)   potassium chloride  10 mEq (09/11/23 1506)   TPN ADULT (ION) 70 mL/hr at 09/11/23 1506   TPN ADULT (ION)     PRN Meds:.albuterol , dextrose , HYDROmorphone  (DILAUDID ) injection, LORazepam , methocarbamol  (ROBAXIN ) injection, metoprolol  tartrate, naLOXone  (NARCAN )  injection, simethicone , sodium chloride  flush, traMADol     Subjective:   Christian [name] was seen and examined today. Not much pain today.  He needs to get oob .     Objective:   Vitals:   09/11/23 0724 09/11/23 1111 09/11/23 1541 09/11/23 1541  BP: 120/68 (!) 138/58 (!) 121/59 (!) 121/59  Pulse: 99 97 96 96  Resp: 17 17    Temp: 98.4 F (36.9 C) 99.2 F (37.3 C) 98.6 F (37 C) 98.6 F (37 C)  TempSrc: Oral Oral Oral Oral  SpO2: 100%  99%   Weight:      Height:        Intake/Output Summary (Last 24 hours) at 09/11/2023 1630 Last data filed at 09/11/2023 1506 Gross per 24 hour  Intake 1662.22 ml  Output 1130 ml   Net 532.22 ml   Filed Weights   09/09/23 1525 09/09/23 1819 09/11/23 0500  Weight: 91.9 kg 90.9 kg 89.6 kg     Exam General exam: Appears calm and comfortable  Respiratory system: Clear to auscultation. Respiratory  effort normal. Cardiovascular system: S1 & S2 heard, RRR. No JVD, murmurs,  Gastrointestinal system: Abdomen is soft, distended, minimal bowel sounds, mild tenderness.  Central nervous system: Alert and oriented. Extremities: Symmetric 5 x 5 power. Skin: No rashes,  Psychiatry: mood is appropriate.       Data Reviewed:  I have personally reviewed following labs and imaging studies   CBC Lab Results  Component Value Date   WBC 12.6 (H) 09/11/2023   RBC 2.54 (L) 09/11/2023   HGB 8.4 (L) 09/11/2023   HCT 25.6 (L) 09/11/2023   MCV 100.8 (H) 09/11/2023   MCH 33.1 09/11/2023   PLT 420 (H) 09/11/2023   MCHC 32.8 09/11/2023   RDW 15.8 (H) 09/11/2023   LYMPHSABS 0.8 09/11/2023   MONOABS 0.7 09/11/2023   EOSABS 0.1 09/11/2023   BASOSABS 0.1 09/11/2023     Last metabolic panel Lab Results  Component Value Date   NA 138 09/11/2023   K 3.2 (L) 09/11/2023   CL 90 (L) 09/11/2023   CO2 29 09/11/2023   BUN 91 (H) 09/11/2023   CREATININE 5.86 (H) 09/11/2023   GLUCOSE 239 (H) 09/11/2023   GFRNONAA 9 (L) 09/11/2023   GFRAA 19 (L) 10/18/2019   CALCIUM  8.4 (L) 09/11/2023   PHOS 3.9 09/11/2023   PROT 6.0 (L) 09/09/2023   ALBUMIN  1.7 (L) 09/10/2023   LABGLOB 2.4 05/22/2021   AGRATIO 1.9 05/22/2021   BILITOT 1.0 09/09/2023   ALKPHOS 65 09/09/2023   AST 29 09/09/2023   ALT 21 09/09/2023   ANIONGAP 19 (H) 09/11/2023    CBG (last 3)  Recent Labs    09/11/23 0319 09/11/23 1115 09/11/23 1546  GLUCAP 212* 233* 226*      Coagulation Profile: No results for input(s): "INR", "PROTIME" in the last 168 hours.   Radiology Studies: DG Abd 1 View Result Date: 09/10/2023 CLINICAL DATA:  NG tube placement. EXAM: ABDOMEN - 1 VIEW COMPARISON:  09/09/2023  FINDINGS: NG tube tip is in the stomach with proximal side port below the GE junction. Diffuse gaseous small bowel distension identified with gas dilated loop in the left abdomen measuring up to 5.6 cm diameter. IMPRESSION: 1. NG tube tip is in the stomach. 2. Diffuse gaseous small bowel dilatation within the visualized upper abdomen. Electronically Signed   By: Donnal Fusi M.D.   On: 09/10/2023 07:29   DG Abd 1 View Result Date: 09/09/2023 CLINICAL DATA:  Nasogastric tube placement. EXAM: ABDOMEN - 1 VIEW COMPARISON:  None Available. FINDINGS: The tip and side port of the enteric tube are below the diaphragm in the stomach. The tube is looped in the mid stomach with the tip directed cranially. IMPRESSION: Enteric tube looped in the mid stomach with the tip directed cranially. Electronically Signed   By: Chadwick Colonel M.D.   On: 09/09/2023 20:49       Feliciana Horn M.D. [facility] 09/11/2023, 4:30 PM  Available via Epic secure chat 7am-7pm After 7 pm, please refer to night coverage provider listed on amion.

## 2023-09-11 NOTE — Progress Notes (Signed)
 [facility] NEPHROLOGY PROGRESS NOTE  Assessment/ Plan: Pt is a 78 y.o. yo male with CAD status post CABG, underwent laparoscopic appendectomy on 5/7 developed postop ileus, AKI on CKD.  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  UA with pyuria and kidney ultrasound without hydronephrosis. No sign of renal recovery with worsening azotemia. First HD on 5/14 and second HD on 5/15 and tolerated well via temp HD cath. Plan for 3rd HD today  Renal navigator is aware of arranging outpatient HD.  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  On 5/14, the AV fistula is unable to cannulate even with small needle 17G.  S/p RIJ temp HD cath by IR on 5/14. VVS following for perm access and TDC conversion.    # Appendicitis with possible abscess and postop ileus: On antibiotics and drain per surgical team.  # Acute blood loss anemia, CT angio negative for bleeding.  Transfuse as needed.  # Metabolic acidosis: dc sodium bicarbonate , HD now.  # CKD-MBD, hyperphosphatemia: Started sevelamer , PTH level 337.   Discussed with the dialysis nurse.  Subjective: Seen and examined.  No new event.  Continued to have NG tube with greenish fluid.  Denies nausea, vomiting, chest pain or shortness of breath.  Objective Vital signs in last 24 hours: Vitals:   09/10/23 2304 09/11/23 0316 09/11/23 0500 09/11/23 0724  BP: (!) 141/66 135/63  120/68  Pulse: 97 (!) 103  99  Resp: 18 18  17   Temp: 98.6 F (37 C) 99.9 F (37.7 C)  98.4 F (36.9 C)  TempSrc: Oral Oral  Oral  SpO2: 100% 100%  100%  Weight:   89.6 kg   Height:       Weight change: -2.3 kg  Intake/Output Summary (Last 24 hours) at 09/11/2023 0935 Last data filed at 09/11/2023 1610 Gross per 24 hour  Intake 0  ml  Output 1430 ml  Net -1430 ml       Labs: RENAL PANEL Recent Labs  Lab 09/06/23 0652 09/07/23 0323 09/08/23 0024 09/09/23 0651 09/10/23 1049 09/10/23 1050 09/11/23 0546  NA 136 136 140 139 139 139 138  K 4.4 3.8 4.1 3.4* 3.1* 3.1* 3.2*  CL 99 95* 96* 98 94* 94* 90*  CO2 16* 21* 19* 27 30 29 29   GLUCOSE 104* 72 82 198* 196* 195* 239*  BUN 119* 117* 119* 90* 67* 67* 91*  CREATININE 8.15* 8.66* 8.87* 6.75* 5.28* 5.32* 5.86*  CALCIUM  8.3* 8.1* 8.2* 8.1* 8.4* 8.4* 8.4*  MG 2.7* 2.4 2.6* 2.4 2.3  --  2.2  PHOS 11.0* 10.4*  --  6.9* 5.1* 5.1* 3.9  ALBUMIN  2.0* 1.7* 1.9* 1.7*  --  1.7*  --     Liver Function Tests: Recent Labs  Lab 09/08/23 0024 09/09/23 0651 09/10/23 1050  AST 23 29  --   ALT 20 21  --   ALKPHOS 62 65  --   BILITOT 2.1* 1.0  --   PROT 6.2* 6.0*  --   ALBUMIN  1.9* 1.7* 1.7*   No results for input(s): "LIPASE", "AMYLASE" in the last 168 hours.  No results for input(s): "AMMONIA" in the last 168 hours. CBC: Recent Labs    01/18/23 1118 04/06/23 0910 09/06/23 9604 09/07/23 0323 09/08/23 0024 09/09/23  1535 09/10/23 1049  HGB  --    < > 11.4* 10.0* 10.9* 8.3* 8.5*  MCV  --    < > 98.9 95.4 96.7 100.0 100.0  VITAMINB12 565  --   --   --   --   --   --    < > = values in this interval not displayed.    Cardiac Enzymes: No results for input(s): "CKTOTAL", "CKMB", "CKMBINDEX", "TROPONINI" in the last 168 hours. CBG: Recent Labs  Lab 09/10/23 1135 09/10/23 1551 09/10/23 2037 09/10/23 2306 09/11/23 0319  GLUCAP 199* 189* 233* 218* 212*    Iron Studies: No results for input(s): "IRON", "TIBC", "TRANSFERRIN", "FERRITIN" in the last 72 hours. Studies/Results: DG Abd 1 View Result Date: 09/10/2023 CLINICAL DATA:  NG tube placement. EXAM: ABDOMEN - 1 VIEW COMPARISON:  09/09/2023 FINDINGS: NG tube tip is in the stomach with proximal side port below the GE junction. Diffuse gaseous small bowel distension identified with gas dilated loop in the  left abdomen measuring up to 5.6 cm diameter. IMPRESSION: 1. NG tube tip is in the stomach. 2. Diffuse gaseous small bowel dilatation within the visualized upper abdomen. Electronically Signed   By: Donnal Fusi M.D.   On: 09/10/2023 07:29   DG Abd 1 View Result Date: 09/09/2023 CLINICAL DATA:  Nasogastric tube placement. EXAM: ABDOMEN - 1 VIEW COMPARISON:  None Available. FINDINGS: The tip and side port of the enteric tube are below the diaphragm in the stomach. The tube is looped in the mid stomach with the tip directed cranially. IMPRESSION: Enteric tube looped in the mid stomach with the tip directed cranially. Electronically Signed   By: Chadwick Colonel M.D.   On: 09/09/2023 20:49   DG Abd Portable 1V Result Date: 09/09/2023 CLINICAL DATA:  Dislodged gastrostomy tube. EXAM: PORTABLE ABDOMEN - 1 VIEW COMPARISON:  CT abdomen pelvis dated 09/08/2023. FINDINGS: Enteric tube extends into the distal stomach and fold back on itself with tip in the proximal thigh. Diffusely dilated air-filled loops of small bowel measure up to 5 cm. No acute osseous pathology. IMPRESSION: 1. Enteric tube extends into the distal stomach and fold back on itself with tip in the proximal thigh. 2. Diffusely dilated air-filled loops of small bowel. Electronically Signed   By: Angus Bark M.D.   On: 09/09/2023 16:11    Medications: Infusions:  piperacillin -tazobactam (ZOSYN )  IV 2.25 g (09/11/23 0604)   potassium chloride      TPN ADULT (ION) 70 mL/hr at 09/10/23 1735   TPN ADULT (ION)      Scheduled Medications:  Chlorhexidine  Gluconate Cloth  6 each Topical Q0600   Chlorhexidine  Gluconate Cloth  6 each Topical Q0600   fluticasone  furoate-vilanterol  1 puff Inhalation Daily   heparin  injection (subcutaneous)  5,000 Units Subcutaneous Q8H   insulin  aspart  0-15 Units Subcutaneous Q4H   metoprolol  tartrate  5 mg Intravenous Q6H   sevelamer  carbonate  0.8 g Oral TID WC   sodium chloride  flush  3 mL Intravenous  Q12H    have reviewed scheduled and prn medications.  Physical Exam: General:NAD, he has NG tube. Heart:RRR, s1s2 nl Lungs:clear b/l, no crackle Abdomen:soft, drain present Extremities:No edema Neurology: Alert, awake and following commands Vascular Access: AV fistula is quite deep, thrill positive. Temp HD cath.  [place] Gustafson Prasad Jnae [place]aston 09/11/2023,9:35 AM  LOS: 11 days

## 2023-09-11 NOTE — Progress Notes (Signed)
 Pt off to unit for Hemodialysis via bed.

## 2023-09-11 NOTE — Plan of Care (Signed)
 Maintained on TPN, resting comfortably, no complaints noted. Problem: Education: Goal: Ability to describe self-care measures that may prevent or decrease complications (Diabetes Survival Skills Education) will improve Outcome: Progressing   Problem: Fluid Volume: Goal: Ability to maintain a balanced intake and output will improve Outcome: Progressing   Problem: Health Behavior/Discharge Planning: Goal: Ability to manage health-related needs will improve Outcome: Progressing   Problem: Nutritional: Goal: Maintenance of adequate nutrition will improve Outcome: Progressing   Problem: Skin Integrity: Goal: Risk for impaired skin integrity will decrease Outcome: Progressing   Problem: Tissue Perfusion: Goal: Adequacy of tissue perfusion will improve Outcome: Progressing   Problem: Clinical Measurements: Goal: Will remain free from infection Outcome: Progressing   Problem: Activity: Goal: Risk for activity intolerance will decrease Outcome: Progressing   Problem: Elimination: Goal: Will not experience complications related to bowel motility Outcome: Progressing   Problem: Pain Managment: Goal: General experience of comfort will improve and/or be controlled Outcome: Progressing   Problem: Safety: Goal: Ability to remain free from injury will improve Outcome: Progressing

## 2023-09-11 NOTE — Progress Notes (Signed)
 4 Days Post-Op   Subjective/Chief Complaint: Continues with some flatus apparently, but no BM.  High NGT output.  Some abdominal discomfort.  Not getting up and mobilizing much.  Objective: Vital signs in last 24 hours: Temp:  [97.7 F (36.5 C)-99.9 F (37.7 C)] 98.4 F (36.9 C) (05/17 0724) Pulse Rate:  [91-103] 99 (05/17 0724) Resp:  [17-20] 17 (05/17 0724) BP: (120-141)/(63-70) 120/68 (05/17 0724) SpO2:  [99 %-100 %] 100 % (05/17 0724) Weight:  [89.6 kg] 89.6 kg (05/17 0500) Last BM Date : 09/10/23  Intake/Output from previous day: 05/16 0701 - 05/17 0700 In: 0  Out: 1430 [Urine:300; Emesis/NG output:820; Drains:310] Intake/Output this shift: No intake/output data recorded.  Ab distended, protunerant, LLQ drain with dark bloody drainage, output documented over 300 but RN in the room thinks this is misdocumenation and he has been having very little out of his drain., RLQ tenderness without peritonitis   NG clamped - with over 800cc of bilious output.    Lab Results:  Recent Labs    09/09/23 1535 09/10/23 1049  WBC 15.1* 13.8*  HGB 8.3* 8.5*  HCT 25.9* 25.7*  PLT 402* 411*   BMET Recent Labs    09/10/23 1050 09/11/23 0546  NA 139 138  K 3.1* 3.2*  CL 94* 90*  CO2 29 29  GLUCOSE 195* 239*  BUN 67* 91*  CREATININE 5.32* 5.86*  CALCIUM  8.4* 8.4*   PT/INR No results for input(s): "LABPROT", "INR" in the last 72 hours. ABG No results for input(s): "PHART", "HCO3" in the last 72 hours.  Invalid input(s): "PCO2", "PO2"  Studies/Results: DG Abd 1 View Result Date: 09/10/2023 CLINICAL DATA:  NG tube placement. EXAM: ABDOMEN - 1 VIEW COMPARISON:  09/09/2023 FINDINGS: NG tube tip is in the stomach with proximal side port below the GE junction. Diffuse gaseous small bowel distension identified with gas dilated loop in the left abdomen measuring up to 5.6 cm diameter. IMPRESSION: 1. NG tube tip is in the stomach. 2. Diffuse gaseous small bowel dilatation within the  visualized upper abdomen. Electronically Signed   By: Donnal Fusi M.D.   On: 09/10/2023 07:29   DG Abd 1 View Result Date: 09/09/2023 CLINICAL DATA:  Nasogastric tube placement. EXAM: ABDOMEN - 1 VIEW COMPARISON:  None Available. FINDINGS: The tip and side port of the enteric tube are below the diaphragm in the stomach. The tube is looped in the mid stomach with the tip directed cranially. IMPRESSION: Enteric tube looped in the mid stomach with the tip directed cranially. Electronically Signed   By: Chadwick Colonel M.D.   On: 09/09/2023 20:49   DG Abd Portable 1V Result Date: 09/09/2023 CLINICAL DATA:  Dislodged gastrostomy tube. EXAM: PORTABLE ABDOMEN - 1 VIEW COMPARISON:  CT abdomen pelvis dated 09/08/2023. FINDINGS: Enteric tube extends into the distal stomach and fold back on itself with tip in the proximal thigh. Diffusely dilated air-filled loops of small bowel measure up to 5 cm. No acute osseous pathology. IMPRESSION: 1. Enteric tube extends into the distal stomach and fold back on itself with tip in the proximal thigh. 2. Diffusely dilated air-filled loops of small bowel. Electronically Signed   By: Angus Bark M.D.   On: 09/09/2023 16:11    Anti-infectives: Anti-infectives (From admission, onward)    Start     Dose/Rate Route Frequency Ordered Stop   08/31/23 2200  piperacillin -tazobactam (ZOSYN ) IVPB 2.25 g        2.25 g 100 mL/hr over 30 Minutes Intravenous  Every 8 hours 08/31/23 1953     08/31/23 0600  piperacillin -tazobactam (ZOSYN ) IVPB 3.375 g  Status:  Discontinued        3.375 g 100 mL/hr over 30 Minutes Intravenous Every 8 hours 08/31/23 0532 08/31/23 1953   08/30/23 2245  piperacillin -tazobactam (ZOSYN ) IVPB 3.375 g        3.375 g 100 mL/hr over 30 Minutes Intravenous  Once 08/30/23 2233 08/31/23 0005       Assessment/Plan: POD 11 - s/p lap appy with JP drain placement for perforated appendicitis [name] 5/6  - persistent ileus, continue NG tube to LIWS, now  bridled. Side port of NG appears to be functioning well.  Cont for now and await more bowel function - CT abd pelvis 5/14 shows JP within a hematoma, post-op ileus with dilated small bowel, bilateral R>L pleural effusions.  - dark sanguinous output from JP drain, Hgb 8.5, monitor - encourage OOB, ambulation   FEN - NPO, TPN started 5/14 Lines/tubes - temp HD cath R internal jugular, PIV x2 RUE, LUE w/ AV Fistula (non-functional), NGT to be replaced, LLE JP drain Foley - in placed, clear yellow urine VTE - heparin /plavix  on hold ID - Zosyn ; WBC 13.8 yesterday  Christian [name] 09/11/2023

## 2023-09-11 NOTE — Plan of Care (Signed)
  Problem: Education: Goal: Ability to describe self-care measures that may prevent or decrease complications (Diabetes Survival Skills Education) will improve Outcome: Not Progressing Goal: Individualized Educational Video(s) Outcome: Not Progressing   Problem: Coping: Goal: Ability to adjust to condition or change in health will improve Outcome: Not Progressing   Problem: Fluid Volume: Goal: Ability to maintain a balanced intake and output will improve Outcome: Not Progressing   Problem: Health Behavior/Discharge Planning: Goal: Ability to identify and utilize available resources and services will improve Outcome: Not Progressing Goal: Ability to manage health-related needs will improve Outcome: Not Progressing   Problem: Metabolic: Goal: Ability to maintain appropriate glucose levels will improve Outcome: Not Progressing   Problem: Nutritional: Goal: Maintenance of adequate nutrition will improve Outcome: Not Progressing Goal: Progress toward achieving an optimal weight will improve Outcome: Not Progressing   Problem: Skin Integrity: Goal: Risk for impaired skin integrity will decrease Outcome: Not Progressing   Problem: Tissue Perfusion: Goal: Adequacy of tissue perfusion will improve Outcome: Not Progressing   Problem: Education: Goal: Knowledge of General Education information will improve Description: Including pain rating scale, medication(s)/side effects and non-pharmacologic comfort measures Outcome: Not Progressing   Problem: Health Behavior/Discharge Planning: Goal: Ability to manage health-related needs will improve Outcome: Not Progressing   Problem: Clinical Measurements: Goal: Ability to maintain clinical measurements within normal limits will improve Outcome: Not Progressing Goal: Will remain free from infection Outcome: Not Progressing Goal: Diagnostic test results will improve Outcome: Not Progressing Goal: Respiratory complications will  improve Outcome: Not Progressing Goal: Cardiovascular complication will be avoided Outcome: Not Progressing   Problem: Activity: Goal: Risk for activity intolerance will decrease Outcome: Not Progressing   Problem: Nutrition: Goal: Adequate nutrition will be maintained Outcome: Not Progressing   Problem: Coping: Goal: Level of anxiety will decrease Outcome: Not Progressing   Problem: Elimination: Goal: Will not experience complications related to bowel motility Outcome: Not Progressing Goal: Will not experience complications related to urinary retention Outcome: Not Progressing   Problem: Pain Managment: Goal: General experience of comfort will improve and/or be controlled Outcome: Not Progressing   Problem: Safety: Goal: Ability to remain free from injury will improve Outcome: Not Progressing   Problem: Skin Integrity: Goal: Risk for impaired skin integrity will decrease Outcome: Not Progressing   Problem: Education: Goal: Knowledge of disease and its progression will improve Outcome: Not Progressing Goal: Individualized Educational Video(s) Outcome: Not Progressing   Problem: Fluid Volume: Goal: Compliance with measures to maintain balanced fluid volume will improve Outcome: Not Progressing   Problem: Health Behavior/Discharge Planning: Goal: Ability to manage health-related needs will improve Outcome: Not Progressing   Problem: Nutritional: Goal: Ability to make healthy dietary choices will improve Outcome: Not Progressing   Problem: Clinical Measurements: Goal: Complications related to the disease process, condition or treatment will be avoided or minimized Outcome: Not Progressing   Problem: Education: Goal: Understanding of CV disease, CV risk reduction, and recovery process will improve Outcome: Not Progressing Goal: Individualized Educational Video(s) Outcome: Not Progressing   Problem: Activity: Goal: Ability to return to baseline activity  level will improve Outcome: Not Progressing   Problem: Cardiovascular: Goal: Ability to achieve and maintain adequate cardiovascular perfusion will improve Outcome: Not Progressing Goal: Vascular access site(s) Level 0-1 will be maintained Outcome: Not Progressing   Problem: Health Behavior/Discharge Planning: Goal: Ability to safely manage health-related needs after discharge will improve Outcome: Not Progressing

## 2023-09-12 DIAGNOSIS — D62 Acute posthemorrhagic anemia: Secondary | ICD-10-CM | POA: Diagnosis not present

## 2023-09-12 DIAGNOSIS — K37 Unspecified appendicitis: Secondary | ICD-10-CM | POA: Diagnosis not present

## 2023-09-12 DIAGNOSIS — I159 Secondary hypertension, unspecified: Secondary | ICD-10-CM | POA: Diagnosis not present

## 2023-09-12 DIAGNOSIS — N179 Acute kidney failure, unspecified: Secondary | ICD-10-CM | POA: Diagnosis not present

## 2023-09-12 LAB — GLUCOSE, CAPILLARY
Glucose-Capillary: 194 mg/dL — ABNORMAL HIGH (ref 70–99)
Glucose-Capillary: 200 mg/dL — ABNORMAL HIGH (ref 70–99)
Glucose-Capillary: 211 mg/dL — ABNORMAL HIGH (ref 70–99)
Glucose-Capillary: 215 mg/dL — ABNORMAL HIGH (ref 70–99)
Glucose-Capillary: 226 mg/dL — ABNORMAL HIGH (ref 70–99)
Glucose-Capillary: 233 mg/dL — ABNORMAL HIGH (ref 70–99)
Glucose-Capillary: 239 mg/dL — ABNORMAL HIGH (ref 70–99)
Glucose-Capillary: 246 mg/dL — ABNORMAL HIGH (ref 70–99)
Glucose-Capillary: 251 mg/dL — ABNORMAL HIGH (ref 70–99)

## 2023-09-12 LAB — BASIC METABOLIC PANEL WITH GFR
Anion gap: 13 (ref 5–15)
BUN: 53 mg/dL — ABNORMAL HIGH (ref 8–23)
CO2: 29 mmol/L (ref 22–32)
Calcium: 8.4 mg/dL — ABNORMAL LOW (ref 8.9–10.3)
Chloride: 95 mmol/L — ABNORMAL LOW (ref 98–111)
Creatinine, Ser: 3.78 mg/dL — ABNORMAL HIGH (ref 0.61–1.24)
GFR, Estimated: 16 mL/min — ABNORMAL LOW (ref >60)
Glucose, Bld: 245 mg/dL — ABNORMAL HIGH (ref 70–99)
Potassium: 3.4 mmol/L — ABNORMAL LOW (ref 3.5–5.1)
Sodium: 137 mmol/L (ref 135–145)

## 2023-09-12 LAB — PHOSPHORUS: Phosphorus: 1.8 mg/dL — ABNORMAL LOW (ref 2.5–4.6)

## 2023-09-12 LAB — MAGNESIUM: Magnesium: 1.8 mg/dL (ref 1.7–2.4)

## 2023-09-12 LAB — PREPARE RBC (CROSSMATCH)

## 2023-09-12 MED ORDER — POTASSIUM CHLORIDE 10 MEQ/100ML IV SOLN: 10.0000 meq | INTRAVENOUS | Status: DC

## 2023-09-12 MED ORDER — POTASSIUM CHLORIDE 10 MEQ/100ML IV SOLN
10.0000 meq | INTRAVENOUS | Status: AC
  Administered 2023-09-12 (×3): 10 meq via INTRAVENOUS
  Filled 2023-09-12 (×3): qty 100

## 2023-09-12 MED ORDER — SODIUM CHLORIDE 0.9% IV SOLUTION: Freq: Once | INTRAVENOUS | Status: AC

## 2023-09-12 MED ORDER — HYDRALAZINE HCL 20 MG/ML IJ SOLN: 10.0000 mg | Freq: Four times a day (QID) | INTRAMUSCULAR | Status: DC | PRN

## 2023-09-12 MED ORDER — ONDANSETRON HCL 4 MG/2ML IJ SOLN
4.0000 mg | Freq: Four times a day (QID) | INTRAMUSCULAR | Status: DC | PRN
  Administered 2023-09-16 – 2023-09-27 (×4): 4 mg via INTRAVENOUS
  Filled 2023-09-12 (×4): qty 2

## 2023-09-12 MED ORDER — METOPROLOL TARTRATE 5 MG/5ML IV SOLN
10.0000 mg | Freq: Four times a day (QID) | INTRAVENOUS | Status: DC
  Administered 2023-09-12 – 2023-09-16 (×14): 10 mg via INTRAVENOUS
  Filled 2023-09-12 (×15): qty 10

## 2023-09-12 MED ORDER — MAGNESIUM SULFATE IN D5W 1-5 GM/100ML-% IV SOLN
1.0000 g | Freq: Once | INTRAVENOUS | Status: AC
  Administered 2023-09-13: 1 g via INTRAVENOUS
  Filled 2023-09-12 (×2): qty 100

## 2023-09-12 MED ORDER — TRACE MINERALS CU-MN-SE-ZN 300-55-60-3000 MCG/ML IV SOLN
INTRAVENOUS | Status: AC
  Filled 2023-09-12: qty 772.8

## 2023-09-12 MED ORDER — INSULIN ASPART 100 UNIT/ML IJ SOLN
0.0000 [IU] | INTRAMUSCULAR | Status: DC
  Administered 2023-09-12: 7 [IU] via SUBCUTANEOUS
  Administered 2023-09-12: 4 [IU] via SUBCUTANEOUS
  Administered 2023-09-12 – 2023-09-13 (×2): 7 [IU] via SUBCUTANEOUS
  Administered 2023-09-13: 4 [IU] via SUBCUTANEOUS
  Administered 2023-09-13 – 2023-09-14 (×5): 7 [IU] via SUBCUTANEOUS
  Administered 2023-09-14: 11 [IU] via SUBCUTANEOUS
  Administered 2023-09-14 – 2023-09-15 (×5): 4 [IU] via SUBCUTANEOUS
  Administered 2023-09-15 (×3): 7 [IU] via SUBCUTANEOUS
  Administered 2023-09-16 – 2023-09-17 (×6): 4 [IU] via SUBCUTANEOUS
  Administered 2023-09-17: 3 [IU] via SUBCUTANEOUS
  Administered 2023-09-17: 4 [IU] via SUBCUTANEOUS

## 2023-09-12 MED ORDER — SODIUM CHLORIDE 0.9 % IV SOLN
10.0000 mmol | Freq: Once | INTRAVENOUS | Status: AC
  Administered 2023-09-12: 10 mmol via INTRAVENOUS
  Filled 2023-09-12: qty 3.33

## 2023-09-12 NOTE — Plan of Care (Signed)
  Problem: Education: Goal: Ability to describe self-care measures that may prevent or decrease complications (Diabetes Survival Skills Education) will improve Outcome: Not Progressing Goal: Individualized Educational Video(s) Outcome: Not Progressing   Problem: Coping: Goal: Ability to adjust to condition or change in health will improve Outcome: Not Progressing   Problem: Fluid Volume: Goal: Ability to maintain a balanced intake and output will improve Outcome: Not Progressing   Problem: Health Behavior/Discharge Planning: Goal: Ability to identify and utilize available resources and services will improve Outcome: Not Progressing Goal: Ability to manage health-related needs will improve Outcome: Not Progressing   Problem: Metabolic: Goal: Ability to maintain appropriate glucose levels will improve Outcome: Not Progressing   Problem: Nutritional: Goal: Maintenance of adequate nutrition will improve Outcome: Not Progressing Goal: Progress toward achieving an optimal weight will improve Outcome: Not Progressing   Problem: Skin Integrity: Goal: Risk for impaired skin integrity will decrease Outcome: Not Progressing   Problem: Tissue Perfusion: Goal: Adequacy of tissue perfusion will improve Outcome: Not Progressing   Problem: Education: Goal: Knowledge of General Education information will improve Description: Including pain rating scale, medication(s)/side effects and non-pharmacologic comfort measures Outcome: Not Progressing   Problem: Health Behavior/Discharge Planning: Goal: Ability to manage health-related needs will improve Outcome: Not Progressing   Problem: Clinical Measurements: Goal: Ability to maintain clinical measurements within normal limits will improve Outcome: Not Progressing Goal: Will remain free from infection Outcome: Not Progressing Goal: Diagnostic test results will improve Outcome: Not Progressing Goal: Respiratory complications will  improve Outcome: Not Progressing Goal: Cardiovascular complication will be avoided Outcome: Not Progressing   Problem: Activity: Goal: Risk for activity intolerance will decrease Outcome: Not Progressing   Problem: Nutrition: Goal: Adequate nutrition will be maintained Outcome: Not Progressing   Problem: Coping: Goal: Level of anxiety will decrease Outcome: Not Progressing   Problem: Elimination: Goal: Will not experience complications related to bowel motility Outcome: Not Progressing Goal: Will not experience complications related to urinary retention Outcome: Not Progressing   Problem: Pain Managment: Goal: General experience of comfort will improve and/or be controlled Outcome: Not Progressing   Problem: Safety: Goal: Ability to remain free from injury will improve Outcome: Not Progressing   Problem: Skin Integrity: Goal: Risk for impaired skin integrity will decrease Outcome: Not Progressing   Problem: Education: Goal: Knowledge of disease and its progression will improve Outcome: Not Progressing Goal: Individualized Educational Video(s) Outcome: Not Progressing   Problem: Fluid Volume: Goal: Compliance with measures to maintain balanced fluid volume will improve Outcome: Not Progressing   Problem: Health Behavior/Discharge Planning: Goal: Ability to manage health-related needs will improve Outcome: Not Progressing   Problem: Nutritional: Goal: Ability to make healthy dietary choices will improve Outcome: Not Progressing   Problem: Clinical Measurements: Goal: Complications related to the disease process, condition or treatment will be avoided or minimized Outcome: Not Progressing   Problem: Education: Goal: Understanding of CV disease, CV risk reduction, and recovery process will improve Outcome: Not Progressing Goal: Individualized Educational Video(s) Outcome: Not Progressing   Problem: Activity: Goal: Ability to return to baseline activity  level will improve Outcome: Not Progressing   Problem: Cardiovascular: Goal: Ability to achieve and maintain adequate cardiovascular perfusion will improve Outcome: Not Progressing Goal: Vascular access site(s) Level 0-1 will be maintained Outcome: Not Progressing   Problem: Health Behavior/Discharge Planning: Goal: Ability to safely manage health-related needs after discharge will improve Outcome: Not Progressing

## 2023-09-12 NOTE — Progress Notes (Signed)
 [facility]                                                                               Milad Bublitz, is a 78 y.o. male, DOB - [birth year], ZHY:[id number] Admit date - 08/30/2023    Outpatient Primary MD for the patient is [name], MD  LOS - 12  days    Brief summary    78 y.o. yo male with CAD status post CABG, postoperative CVA and renal failure , underwent laparoscopic appendectomy for perforated appendicitis by Dr Aniceto Barley on 5/6 developed postop ileus, s/p JP drain and has a NG tube on suction,  AKI on CKD stage 4, currently on HD. Patient had a old AV fistula , was unable to cannulate , underwent RIJ temp HD cath by IR on 5/14.  Recent CT abd and pelvis on 5/14 shows JP drain with a hematoma, with persistent post op ileus with dilated small bowel.  Patient reports being nauseated .   Assessment & Plan    Assessment and Plan:  Appendicitis with possible periappendiceal abscess Post op ileus - s/p lap appendectomy on 5/6 - continue zosyn  as per general surgery. Day 12 of IV zosyn . Plan to d.c antibiotics after 14 days.  - drain remains in place with dark sanguinous output .  - Poor intake and no sign of ileus improving.  Surgery recommending TPN which was started on 5/14. - patient underwent Placement of a right IJ approach triple lumen temp HD cath  on 5/14 as putting in a tunneled HD catheter at this time with leukocytosis might be risky and more prone to infection.  - continue NGT for decompression ,pain controlled.  - gen surgery, IR, vascular surgery on board.  - CT abdomen and pelvis shows complex 5.5 by 11.7 by 4.3 cm collection adjacent to the appendectomy site. - continue with TPN. Patient remains afebrile and leukocytosis improving.  - NGT to be connected LWIS.    Acute on CKDIV ATN - patient has history of CKD4. Baseline creat ~ 3.2 - 3.5, eGFR~ 17 - he has required HD the past - possibly volume depletion from surgery/blood loss and  when he had severe hypoTN on 5/7; I don't think due to zosyn  but AIN always a possibility as well  - seems to be ATN with uptrending creat/BUN; has been slowly approaching need for dialysis - Nephrology on board.  - Vascular surgery consulted; appreciate assistance.  Fistulogram performed 09/07/2023 and balloon venoplasty performed and left arm fistula ready for use.   Unfortunately left arm fistula cannot be cannulated at this time.  - underwent RIJ temp HD cath by IR on 5/14.  - undergoing HD as per nephrology.  - no signs of renal recovery so far.  - further management as per nephrology.     ABLA Hemoglobin  dropped from 10 TO 8.5 to 8.4 to 7.5.  1 units of prbc transfusion ordered and recheck H&H In am.  Patient has dark sanguinous output from JP drain.       HTN Elevated this am.  Currently on metoprolol  5 mg every 6 hours, increase it to 10 mg every 6 hours.  Added hydralazine  10 mg every 8 hours prn.    Type 2 DM; well controlled CBG'S CBG (last 3)  Recent Labs    09/12/23 0843 09/12/23 1109 09/12/23 1229  GLUCAP 239* 246* 233*   Resume SSI. Insulin  in the tpn, managed by pharmacy.    CAD - s/p CABG - cleared by cardiology prior to surgery  Thrombocytosis Monitor.     Hypokalemia  Replaced. Repeat in am.     Malnutrition Type:  Nutrition Problem: Inadequate oral intake Etiology: inability to eat   Malnutrition Characteristics:  Signs/Symptoms: NPO status   Nutrition Interventions:  Interventions: TPN  Estimated body mass index is 29.24 kg/m as calculated from the following:   Height as of this encounter: 5\' 9"  (1.753 m).   Weight as of this encounter: 89.8 kg.  Code Status: full code.  DVT Prophylaxis:  heparin  injection 5,000 Units Start: 09/09/23 1400 Place and maintain sequential compression device Start: 09/01/23 2001 SCDs Start: 08/31/23 0415   Level of Care: Level of care: Progressive Family Communication: None at bedside.    Disposition Plan:     Remains inpatient appropriate:  Pending clinical improvement.    Procedures:  Lap appendectomy.   Consultants:   General surgery Nephrology Vascular surgery.  IR.   Antimicrobials:   Anti-infectives (From admission, onward)    Start     Dose/Rate Route Frequency Ordered Stop   08/31/23 2200  piperacillin -tazobactam (ZOSYN ) IVPB 2.25 g        2.25 g 100 mL/hr over 30 Minutes Intravenous Every 8 hours 08/31/23 1953     08/31/23 0600  piperacillin -tazobactam (ZOSYN ) IVPB 3.375 g  Status:  Discontinued        3.375 g 100 mL/hr over 30 Minutes Intravenous Every 8 hours 08/31/23 0532 08/31/23 1953   08/30/23 2245  piperacillin -tazobactam (ZOSYN ) IVPB 3.375 g        3.375 g 100 mL/hr over 30 Minutes Intravenous  Once 08/30/23 2233 08/31/23 0005        Medications  Scheduled Meds:  sodium chloride    Intravenous Once   Chlorhexidine  Gluconate Cloth  6 each Topical Q0600   Chlorhexidine  Gluconate Cloth  6 each Topical Q0600   fluticasone  furoate-vilanterol  1 puff Inhalation Daily   heparin  injection (subcutaneous)  5,000 Units Subcutaneous Q8H   insulin  aspart  0-20 Units Subcutaneous Q4H   metoprolol  tartrate  5 mg Intravenous Q6H   sevelamer  carbonate  0.8 g Oral TID WC   sodium chloride  flush  3 mL Intravenous Q12H   Continuous Infusions:  magnesium  sulfate bolus IVPB     piperacillin -tazobactam (ZOSYN )  IV 2.25 g (09/12/23 1344)   potassium chloride      potassium PHOSPHATE IVPB (in mmol)     TPN ADULT (ION) 70 mL/hr at 09/11/23 1740   TPN ADULT (ION)     PRN Meds:.albuterol , dextrose , HYDROmorphone  (DILAUDID ) injection, LORazepam , methocarbamol  (ROBAXIN ) injection, metoprolol  tartrate, naLOXone  (NARCAN )  injection, ondansetron  (ZOFRAN ) IV, simethicone , sodium chloride  flush, traMADol     Subjective:   Christian [name] was seen and examined today. Reports being nauseated. Discussed that he will need 1 unit of prbc transfusion.      Objective:   Vitals:   09/12/23 0346 09/12/23 0500 09/12/23 0717 09/12/23 1107  BP: 129/65  (!) 141/66 (!) 146/59  Pulse: (!) 104  98 93  Resp: 20  (!) 21 17  Temp: 98.7 F (37.1 C)  99.1 F (37.3 C) 98.9 F (37.2 C)  TempSrc: Oral  Oral Oral  SpO2: 100%  100% 98%  Weight:  89.8 kg    Height:        Intake/Output Summary (Last 24 hours) at 09/12/2023 1346 Last data filed at 09/12/2023 0435 Gross per 24 hour  Intake 1662.22 ml  Output 2227 ml  Net -564.78 ml   Filed Weights   09/09/23 1819 09/11/23 0500 09/12/23 0500  Weight: 90.9 kg 89.6 kg 89.8 kg     Exam General exam: ill appearing elderly gentleman on 2 lit of [place] oxygen.  Respiratory system: diminished at bases, on 2 lit of [place] oxygen air entry.  Cardiovascular system: S1 & S2 heard, RRR. No JVD,  Gastrointestinal system: Abdomen is nondistended, soft and nontender.  Central nervous system: Alert and oriented.  Extremities: no pedal edema.  Skin: No rashes,  Psychiatry: Mood & affect appropriate.       Data Reviewed:  I have personally reviewed following labs and imaging studies   CBC Lab Results  Component Value Date   WBC 11.9 (H) 09/11/2023   RBC 2.32 (L) 09/11/2023   HGB 7.5 (L) 09/11/2023   HCT 23.5 (L) 09/11/2023   MCV 101.3 (H) 09/11/2023   MCH 32.3 09/11/2023   PLT 379 09/11/2023   MCHC 31.9 09/11/2023   RDW 15.7 (H) 09/11/2023   LYMPHSABS 0.8 09/11/2023   MONOABS 0.7 09/11/2023   EOSABS 0.1 09/11/2023   BASOSABS 0.1 09/11/2023     Last metabolic panel Lab Results  Component Value Date   NA 137 09/12/2023   K 3.4 (L) 09/12/2023   CL 95 (L) 09/12/2023   CO2 29 09/12/2023   BUN 53 (H) 09/12/2023   CREATININE 3.78 (H) 09/12/2023   GLUCOSE 245 (H) 09/12/2023   GFRNONAA 16 (L) 09/12/2023   GFRAA 19 (L) 10/18/2019   CALCIUM  8.4 (L) 09/12/2023   PHOS 1.8 (L) 09/12/2023   PROT 6.0 (L) 09/09/2023   ALBUMIN  1.7 (L) 09/10/2023   LABGLOB 2.4 05/22/2021   AGRATIO 1.9 05/22/2021    BILITOT 1.0 09/09/2023   ALKPHOS 65 09/09/2023   AST 29 09/09/2023   ALT 21 09/09/2023   ANIONGAP 13 09/12/2023    CBG (last 3)  Recent Labs    09/12/23 0843 09/12/23 1109 09/12/23 1229  GLUCAP 239* 246* 233*      Coagulation Profile: No results for input(s): "INR", "PROTIME" in the last 168 hours.   Radiology Studies: No results found.      Feliciana Horn M.D. [facility] 09/12/2023, 1:46 PM  Available via Epic secure chat 7am-7pm After 7 pm, please refer to night coverage provider listed on amion.

## 2023-09-12 NOTE — Progress Notes (Signed)
 Patient c/o nausea and abd pain, unable to even attempt CL diet, NGT placed back on LIWS dark green output observed. Attending MD and trauma service PA notified. Patient made NPO.

## 2023-09-12 NOTE — Plan of Care (Signed)
  Problem: Coping: Goal: Ability to adjust to condition or change in health will improve Outcome: Progressing   Problem: Fluid Volume: Goal: Ability to maintain a balanced intake and output will improve Outcome: Progressing   Problem: Health Behavior/Discharge Planning: Goal: Ability to manage health-related needs will improve Outcome: Progressing   Problem: Skin Integrity: Goal: Risk for impaired skin integrity will decrease Outcome: Progressing   Problem: Education: Goal: Knowledge of General Education information will improve Description: Including pain rating scale, medication(s)/side effects and non-pharmacologic comfort measures Outcome: Progressing   Problem: Clinical Measurements: Goal: Will remain free from infection Outcome: Progressing   Problem: Activity: Goal: Risk for activity intolerance will decrease Outcome: Progressing   Problem: Coping: Goal: Level of anxiety will decrease Outcome: Progressing   Problem: Nutrition: Goal: Adequate nutrition will be maintained Outcome: Progressing   Problem: Pain Managment: Goal: General experience of comfort will improve and/or be controlled Outcome: Progressing   Problem: Safety: Goal: Ability to remain free from injury will improve Outcome: Progressing

## 2023-09-12 NOTE — Progress Notes (Signed)
 [facility] NEPHROLOGY PROGRESS NOTE  Assessment/ Plan: Pt is a 78 y.o. yo male with CAD status post CABG, underwent laparoscopic appendectomy on 5/7 developed postop ileus, AKI on CKD.  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  UA with pyuria and kidney ultrasound without hydronephrosis. No sign of renal recovery with worsening azotemia. First HD on 5/14, second HD on 5/15.  Received third HD yesterday with 1.5 L UF, tolerated well.  Plan for next HD on 5/20, TTS schedule. Renal navigator is aware of arranging outpatient HD.  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  On 5/14, the AV fistula is unable to cannulate even with small needle 17G.  S/p RIJ temp HD cath by IR on 5/14. VVS following for perm access and TDC conversion.    # Appendicitis with possible abscess and postop ileus: On antibiotics and drain per surgical team.  TPN for the nutrition  # Acute blood loss anemia, CT angio negative for bleeding.  Transfuse as needed.  # Metabolic acidosis: dc sodium bicarbonate , HD now.  # CKD-MBD, hyperphosphatemia: Started sevelamer , PTH level 337.   # Hypokalemia: Managed with HD, monitor lab.  Discussed with the dialysis nurse.  Subjective: Seen and examined.  Tolerated dialysis well yesterday.  No new event.  Feels uncomfortable with NG tube.  No chest pain or shortness of breath. Objective Vital signs in last 24 hours: Vitals:   09/12/23 0218 09/12/23 0346 09/12/23 0500 09/12/23 0717  BP: 118/70 129/65  (!) 141/66  Pulse: (!) 106 (!) 104  98  Resp: 20 20  (!) 21  Temp: 98.5 F (36.9 C) 98.7 F (37.1 C)  99.1 F (37.3 C)  TempSrc: Oral Oral  Oral  SpO2: 100% 100%  100%  Weight:   89.8 kg   Height:       Weight change: 0.2  kg  Intake/Output Summary (Last 24 hours) at 09/12/2023 0940 Last data filed at 09/12/2023 0435 Gross per 24 hour  Intake 1662.22 ml  Output 2227 ml  Net -564.78 ml       Labs: RENAL PANEL Recent Labs  Lab 09/06/23 2956 09/07/23 0323 09/08/23 0024 09/09/23 0651 09/10/23 1049 09/10/23 1050 09/11/23 0546  NA 136 136 140 139 139 139 138  K 4.4 3.8 4.1 3.4* 3.1* 3.1* 3.2*  CL 99 95* 96* 98 94* 94* 90*  CO2 16* 21* 19* 27 30 29 29   GLUCOSE 104* 72 82 198* 196* 195* 239*  BUN 119* 117* 119* 90* 67* 67* 91*  CREATININE 8.15* 8.66* 8.87* 6.75* 5.28* 5.32* 5.86*  CALCIUM  8.3* 8.1* 8.2* 8.1* 8.4* 8.4* 8.4*  MG 2.7* 2.4 2.6* 2.4 2.3  --  2.2  PHOS 11.0* 10.4*  --  6.9* 5.1* 5.1* 3.9  ALBUMIN  2.0* 1.7* 1.9* 1.7*  --  1.7*  --     Liver Function Tests: Recent Labs  Lab 09/08/23 0024 09/09/23 0651 09/10/23 1050  AST 23 29  --   ALT 20 21  --   ALKPHOS 62 65  --   BILITOT 2.1* 1.0  --   PROT 6.2* 6.0*  --   ALBUMIN  1.9* 1.7* 1.7*   No results for input(s): "LIPASE", "AMYLASE" in the last 168 hours.  No results for input(s): "AMMONIA" in the  last 168 hours. CBC: Recent Labs    01/18/23 1118 04/06/23 0910 09/08/23 0024 09/09/23 1535 09/10/23 1049 09/11/23 0546 09/11/23 2140  HGB  --    < > 10.9* 8.3* 8.5* 8.4* 7.5*  MCV  --    < > 96.7 100.0 100.0 100.8* 101.3*  VITAMINB12 565  --   --   --   --   --   --    < > = values in this interval not displayed.    Cardiac Enzymes: No results for input(s): "CKTOTAL", "CKMB", "CKMBINDEX", "TROPONINI" in the last 168 hours. CBG: Recent Labs  Lab 09/11/23 2019 09/12/23 0347 09/12/23 0432 09/12/23 0719 09/12/23 0843  GLUCAP 219* 211* 215* 251* 239*    Iron Studies: No results for input(s): "IRON", "TIBC", "TRANSFERRIN", "FERRITIN" in the last 72 hours. Studies/Results: No results found.   Medications: Infusions:  piperacillin -tazobactam (ZOSYN )  IV 2.25 g (09/12/23 0329)   TPN ADULT (ION) 70 mL/hr at 09/11/23  1740    Scheduled Medications:  Chlorhexidine  Gluconate Cloth  6 each Topical Q0600   Chlorhexidine  Gluconate Cloth  6 each Topical Q0600   fluticasone  furoate-vilanterol  1 puff Inhalation Daily   heparin  injection (subcutaneous)  5,000 Units Subcutaneous Q8H   insulin  aspart  0-15 Units Subcutaneous Q4H   insulin  glargine-yfgn  8 Units Subcutaneous Q24H   metoprolol  tartrate  5 mg Intravenous Q6H   sevelamer  carbonate  0.8 g Oral TID WC   sodium chloride  flush  3 mL Intravenous Q12H    have reviewed scheduled and prn medications.  Physical Exam: General:NAD, he has NG tube. Heart:RRR, s1s2 nl Lungs:clear b/l, no crackle Abdomen:soft, drain present Extremities:No edema Neurology: Alert, awake and following commands Vascular Access: AV fistula is quite deep, thrill positive. Temp HD cath.  Daleiza Bacchi Prasad Talaysha Freeberg 09/12/2023,9:40 AM  LOS: 12 days

## 2023-09-12 NOTE — Progress Notes (Signed)
 Back to unit, assessed pt, vital signs taken.

## 2023-09-12 NOTE — Progress Notes (Signed)
 5 Days Post-Op   Subjective/Chief Complaint: Had at least 3BMs yesterday.  NGT output down to almost 100cc overnight.  Total of only 250 in last 24 hrs.  Still hasn't mobilized  Objective: Vital signs in last 24 hours: Temp:  [98.4 F (36.9 C)-99.2 F (37.3 C)] 99.1 F (37.3 C) (05/18 0717) Pulse Rate:  [90-111] 98 (05/18 0717) Resp:  [17-26] 21 (05/18 0717) BP: (118-141)/(58-83) 141/66 (05/18 0717) SpO2:  [87 %-100 %] 100 % (05/18 0717) FiO2 (%):  [3 %] 3 % (05/17 1541) Weight:  [89.8 kg] 89.8 kg (05/18 0500) Last BM Date : 09/11/23  Intake/Output from previous day: 05/17 0701 - 05/18 0700 In: 1662.2 [I.V.:1502.6; IV Piggyback:159.6] Out: 2227 [Urine:470; Emesis/NG output:250; Drains:7] Intake/Output this shift: No intake/output data recorded.  Ab less distended, protuberant, LLQ drain with dark bloody drainage, only 7cc of output, RLQ tenderness as expected  NG clamped - with 250cc of slightly bilious output.    Lab Results:  Recent Labs    09/11/23 0546 09/11/23 2140  WBC 12.6* 11.9*  HGB 8.4* 7.5*  HCT 25.6* 23.5*  PLT 420* 379   BMET Recent Labs    09/10/23 1050 09/11/23 0546  NA 139 138  K 3.1* 3.2*  CL 94* 90*  CO2 29 29  GLUCOSE 195* 239*  BUN 67* 91*  CREATININE 5.32* 5.86*  CALCIUM  8.4* 8.4*   PT/INR No results for input(s): "LABPROT", "INR" in the last 72 hours. ABG No results for input(s): "PHART", "HCO3" in the last 72 hours.  Invalid input(s): "PCO2", "PO2"  Studies/Results: No results found.   Anti-infectives: Anti-infectives (From admission, onward)    Start     Dose/Rate Route Frequency Ordered Stop   08/31/23 2200  piperacillin -tazobactam (ZOSYN ) IVPB 2.25 g        2.25 g 100 mL/hr over 30 Minutes Intravenous Every 8 hours 08/31/23 1953     08/31/23 0600  piperacillin -tazobactam (ZOSYN ) IVPB 3.375 g  Status:  Discontinued        3.375 g 100 mL/hr over 30 Minutes Intravenous Every 8 hours 08/31/23 0532 08/31/23 1953   08/30/23  2245  piperacillin -tazobactam (ZOSYN ) IVPB 3.375 g        3.375 g 100 mL/hr over 30 Minutes Intravenous  Once 08/30/23 2233 08/31/23 0005       Assessment/Plan: POD 12 - s/p lap appy with JP drain placement for perforated appendicitis [name] 5/6  - ileus seems to be improving as he is moving his bowels at least 3 times in the last 24 hrs and decrease in NGT output. -will clamp NGT and give CLD today.  If tolerates this will DC NGT later today. - CT abd pelvis 5/14 shows JP within a hematoma, post-op ileus with dilated small bowel, bilateral R>L pleural effusions.  - dark sanguinous output from JP drain, Hgb 7.5, monitor - encourage OOB, ambulation -WBC almost normal.  Can likely stop abx soon given CT scan with no suspicious infectious process.   FEN - NGT clamped, CLD, TPN started 5/14 Lines/tubes - temp HD cath R internal jugular, PIV x2 RUE, LUE w/ AV Fistula (non-functional), NGT to be replaced, LLE JP drain Foley - in placed, clear yellow urine VTE - heparin /plavix  on hold ID - Zosyn ; WBC 11.9  Christian [name] 09/12/2023

## 2023-09-12 NOTE — Progress Notes (Signed)
 Patient NGT taken off LIWS.

## 2023-09-12 NOTE — Progress Notes (Signed)
 PHARMACY - TOTAL PARENTERAL NUTRITION CONSULT NOTE   Indication: Prolonged ileus  Patient Measurements: Height: 5\' 9"  (175.3 cm) Weight: 89.8 kg (197 lb 15.6 oz) IBW/kg (Calculated) : 70.7 TPN AdjBW (KG): 78.4 Body mass index is 29.24 kg/m. Usual Weight: 206-215lb per chart review and wife  Assessment:  78 yo M with acute appendicitis s/p appendectomy with persistent ileus. 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 Patient with little to no intake since admission (9 days) due to procedures and bloody drain output. Patient may be a little confused right now per RN. Spoke with Wife who states patient eats a variety of foods (vegetable soup with beef, hamburgers, fries etc) 5-6 times a day at home. Last good meal was 5/3 prior to admission. Pharmacy consulted for TPN.   Glucose / Insulin : BG 215-246 since TPN hung, A1C 7.3%, used 23 units SSI/24hr and 30 units in TPN + 8 units glargine Home med- glimepiride  Electrolytes: K 3.4 (Received K 50 mEq IV), Phos 1.8 down, Mg 1.8, CoCa 10.2 (none in TPN), others wnl Renal: New HD- last 5/15> next 5/17, BUN 91 Hepatic: wnl, albumin  1.7, TG 137  Intake / Output; MIVF: UOP charted, NGT > clamping, drain 7mL, LBM 5/17 x3, flatus + 5/17 GI Imaging: 5/5 CT: acute appendicitis with possible small abscess  5/8 CT: drain in place, mild SB distension likely mild ileus  5/9 KUB: increased gas in SB and colon, likely adynamic ileus or early pSBO 5/11 KUB: Gas distended small bowel throughout the abdomen, unchanged.  5/12 KUB: Diffuse gaseous distention of bowel loops in the abdomen, SBO vs ileus  5/13 KUB:  Mild diffuse gaseous dilatation of large and small bowel without significant change 5/14 CT: Scattered loops of mildly dilated small bowel, possibly postop ileus  5/14 KUB: Multiple dilated loops of small bowel.  5/16 KUB: Diffuse gaseous SB dilatation within the visualized upper abdomen GI Surgeries / Procedures:  5/6 laparoscopic  appendectomy with JP drain 5/12 NGT 5/14 replace NGT 5/15 replace NGT  Central access: CVC 5/14 TPN start date: 5/14   Nutritional Goals: Goal Concentrated TPN rate is 70 mL/hr (provides 116 g of protein and 2192 kcals per day)  RD Assessment: Estimated Needs Total Energy Estimated Needs: 2150-2350 Total Protein Estimated Needs: 115-135 grams Total Fluid Estimated Needs: UOP + 1L  Current Nutrition:  NPO+ TPN 5/17 CLD  Plan:  Continue concentrated TPN at goal 70 mL/hr at 1800, meeting 100% of estimated needs Electrolytes in TPN: Na 75 mEq/L, increase K 30 mEq/L, Ca 0 mEq/L, Mg 0 mEq/L, and increase Phos 6 mmol/L. Cl:Ac 1:1 Give IV KCl 30 mEq  KPhos 10 mmol IV x1 Mg 1 g IV x1 Add standard MVI and trace elements to TPN Increase Resistant q4h SSI and adjust as needed   Increase insulin  43 units to TPN  Stop glargine per Dr Nichole Barker Monitor TPN labs daily until stable at goal then on Mon/Thurs  Thank you for involving pharmacy in this patient's care.  Caroline Cinnamon, PharmD, BCPS Clinical Pharmacist Clinical phone for 09/12/2023 is [phone] 09/12/2023 7:03 AM

## 2023-09-13 ENCOUNTER — Other Ambulatory Visit (HOSPITAL_COMMUNITY): Payer: Self-pay

## 2023-09-13 ENCOUNTER — Inpatient Hospital Stay (HOSPITAL_COMMUNITY)

## 2023-09-13 DIAGNOSIS — I159 Secondary hypertension, unspecified: Secondary | ICD-10-CM | POA: Diagnosis not present

## 2023-09-13 DIAGNOSIS — K37 Unspecified appendicitis: Secondary | ICD-10-CM | POA: Diagnosis not present

## 2023-09-13 DIAGNOSIS — A419 Sepsis, unspecified organism: Principal | ICD-10-CM

## 2023-09-13 DIAGNOSIS — Z9889 Other specified postprocedural states: Secondary | ICD-10-CM

## 2023-09-13 DIAGNOSIS — N179 Acute kidney failure, unspecified: Secondary | ICD-10-CM | POA: Diagnosis not present

## 2023-09-13 DIAGNOSIS — D62 Acute posthemorrhagic anemia: Secondary | ICD-10-CM | POA: Diagnosis not present

## 2023-09-13 LAB — CBC WITH DIFFERENTIAL/PLATELET
Abs Immature Granulocytes: 1.2 10*3/uL — ABNORMAL HIGH (ref 0.00–0.07)
Basophils Absolute: 0.1 10*3/uL (ref 0.0–0.1)
Basophils Relative: 1 %
Eosinophils Absolute: 0.1 10*3/uL (ref 0.0–0.5)
Eosinophils Relative: 1 %
HCT: 26.9 % — ABNORMAL LOW (ref 39.0–52.0)
Hemoglobin: 8.7 g/dL — ABNORMAL LOW (ref 13.0–17.0)
Immature Granulocytes: 9 %
Lymphocytes Relative: 7 %
Lymphs Abs: 0.9 10*3/uL (ref 0.7–4.0)
MCH: 32.3 pg (ref 26.0–34.0)
MCHC: 32.3 g/dL (ref 30.0–36.0)
MCV: 100 fL (ref 80.0–100.0)
Monocytes Absolute: 1.1 10*3/uL — ABNORMAL HIGH (ref 0.1–1.0)
Monocytes Relative: 9 %
Neutro Abs: 9.5 10*3/uL — ABNORMAL HIGH (ref 1.7–7.7)
Neutrophils Relative %: 73 %
Platelets: 375 10*3/uL (ref 150–400)
RBC: 2.69 MIL/uL — ABNORMAL LOW (ref 4.22–5.81)
RDW: 17.4 % — ABNORMAL HIGH (ref 11.5–15.5)
WBC: 12.8 10*3/uL — ABNORMAL HIGH (ref 4.0–10.5)
nRBC: 0 % (ref 0.0–0.2)

## 2023-09-13 LAB — BPAM RBC
Blood Product Expiration Date: 202506102359
ISSUE DATE / TIME: 202505182246
Unit Type and Rh: 5100

## 2023-09-13 LAB — COMPREHENSIVE METABOLIC PANEL WITH GFR
ALT: 45 U/L — ABNORMAL HIGH (ref 0–44)
AST: 70 U/L — ABNORMAL HIGH (ref 15–41)
Albumin: 1.7 g/dL — ABNORMAL LOW (ref 3.5–5.0)
Alkaline Phosphatase: 83 U/L (ref 38–126)
Anion gap: 13 (ref 5–15)
BUN: 74 mg/dL — ABNORMAL HIGH (ref 8–23)
CO2: 28 mmol/L (ref 22–32)
Calcium: 8.5 mg/dL — ABNORMAL LOW (ref 8.9–10.3)
Chloride: 99 mmol/L (ref 98–111)
Creatinine, Ser: 4.59 mg/dL — ABNORMAL HIGH (ref 0.61–1.24)
GFR, Estimated: 13 mL/min — ABNORMAL LOW (ref >60)
Glucose, Bld: 209 mg/dL — ABNORMAL HIGH (ref 70–99)
Potassium: 3.4 mmol/L — ABNORMAL LOW (ref 3.5–5.1)
Sodium: 140 mmol/L (ref 135–145)
Total Bilirubin: 1.7 mg/dL — ABNORMAL HIGH (ref 0.0–1.2)
Total Protein: 5.9 g/dL — ABNORMAL LOW (ref 6.5–8.1)

## 2023-09-13 LAB — TYPE AND SCREEN
ABO/RH(D): O POS
Antibody Screen: NEGATIVE
Unit division: 0

## 2023-09-13 LAB — GLUCOSE, CAPILLARY
Glucose-Capillary: 190 mg/dL — ABNORMAL HIGH (ref 70–99)
Glucose-Capillary: 208 mg/dL — ABNORMAL HIGH (ref 70–99)
Glucose-Capillary: 222 mg/dL — ABNORMAL HIGH (ref 70–99)
Glucose-Capillary: 222 mg/dL — ABNORMAL HIGH (ref 70–99)
Glucose-Capillary: 223 mg/dL — ABNORMAL HIGH (ref 70–99)
Glucose-Capillary: 230 mg/dL — ABNORMAL HIGH (ref 70–99)
Glucose-Capillary: 237 mg/dL — ABNORMAL HIGH (ref 70–99)

## 2023-09-13 LAB — TRIGLYCERIDES: Triglycerides: 228 mg/dL — ABNORMAL HIGH (ref <150)

## 2023-09-13 LAB — PHOSPHORUS: Phosphorus: 2.2 mg/dL — ABNORMAL LOW (ref 2.5–4.6)

## 2023-09-13 LAB — MAGNESIUM: Magnesium: 2 mg/dL (ref 1.7–2.4)

## 2023-09-13 MED ORDER — POTASSIUM CHLORIDE 10 MEQ/50ML IV SOLN
10.0000 meq | INTRAVENOUS | Status: DC
  Filled 2023-09-13 (×4): qty 50

## 2023-09-13 MED ORDER — POTASSIUM PHOSPHATES 15 MMOLE/5ML IV SOLN
15.0000 mmol | Freq: Once | INTRAVENOUS | Status: AC
  Administered 2023-09-13: 15 mmol via INTRAVENOUS
  Filled 2023-09-13: qty 5

## 2023-09-13 MED ORDER — TRACE MINERALS CU-MN-SE-ZN 300-55-60-3000 MCG/ML IV SOLN
INTRAVENOUS | Status: AC
  Filled 2023-09-13: qty 772.8

## 2023-09-13 MED ORDER — SODIUM CHLORIDE 0.9 % IV SOLN
1.5000 g | Freq: Two times a day (BID) | INTRAVENOUS | Status: DC
  Administered 2023-09-13 – 2023-09-14 (×3): 1.5 g via INTRAVENOUS
  Filled 2023-09-13 (×5): qty 4

## 2023-09-13 MED ORDER — POTASSIUM CHLORIDE 10 MEQ/100ML IV SOLN
10.0000 meq | INTRAVENOUS | Status: AC
  Administered 2023-09-13 (×4): 10 meq via INTRAVENOUS
  Filled 2023-09-13 (×4): qty 100

## 2023-09-13 NOTE — Progress Notes (Signed)
 Nutrition Follow-up  DOCUMENTATION CODES:   Not applicable  INTERVENTION:  TPN management per pharmacy to meet 100% of estimated nutritional needs Recommend continuing until pt able to meet at least 60% via enteral or PO  NUTRITION DIAGNOSIS:  Inadequate oral intake related to inability to eat as evidenced by NPO status. - Ongoing, being addressed via TPN  GOAL:  Patient will meet greater than or equal to 90% of their needs - Met via TPN  MONITOR:  Skin, I & O's, Labs, Weight trends  REASON FOR ASSESSMENT:  Consult Diet education, Enteral/tube feeding initiation and management (change TF to Nepro)  ASSESSMENT:  78 y.o. male presented to the ED with RLQ abdominal pain x 3 days. PMH includes CHF, CAD s/p CABG, HTN, HLD,T2DM, dementia, G-tube and CKD IV. Pt admitted with appendicitis with possible abscess.   5/05 - Admitted  5/06 - Op, s/p laparoscopic appendectomy, 9F JP drain placement 5/07 - diet advanced to clear liquids 5/08 - NPO 5/10 - diet advanced to clear liquids 5/12 - NPO; NGT placed - LIWS 5/13 - Op, s/p fistulogram 5/14 - s/p R internal jugular triple lumen placed; iHD started; TPN initiated; NGT pulled x 2  5/15 - NG tube replaced w/ bridle 5/18 - NGT clamping trial (did not tolerate)  Pt laying in bed, RN at bedside. Pt asking about having solid food, RD explained that he is still NPO and need to wait for diet advancement. RD explained the slow progression of diet from liquids to solid food as well to ensure tolerance. Pt declined any nausea or vomiting.   Meal Intake 5/06: 25% dinner 5/10: 0% lunch 5/11: 10%  breakfast  Admission Weight: 101.6 kg  Current Weight: 91.8 kg (5/19)  Nutrition Related Medications: NovoLog  0-20 units q4h, Renvela , IV potassium chloride , IV potassium phosphate Labs: Sodium 140, Potassium 4.1, BUN 119, Creatinine 8.87, Phosphorus 10.4, Magnesium  2.6  CBG: 66-94 mg/dL x 24 hrs    UOP: 161 mL x 24 hrs NG output: 1400 mL x 24  hrs JP drain output: 12 mL x 24 hrs  Net UF: 1500 mL (last HD 09/11/23)  Diet Order:   Diet Order             Diet NPO time specified Except for: Ice Chips  Diet effective now                  EDUCATION NEEDS:  No education needs have been identified at this time  Skin:  Skin Assessment: Reviewed RN Assessment  Last BM:  5/19 - Type 7 (smear) x 2  Height:  Ht Readings from Last 1 Encounters:  08/31/23 5\' 9"  (1.753 m)   Weight:  Wt Readings from Last 1 Encounters:  09/13/23 91.8 kg   Ideal Body Weight:  72.7 kg  BMI:  Body mass index is 29.89 kg/m.  Estimated Nutritional Needs:  Kcal:  2150-2350 Protein:  115-135 grams Fluid:  UOP + 1L   Doneta Furbish RD, LDN Clinical Dietitian

## 2023-09-13 NOTE — Progress Notes (Signed)
 PHARMACY - TOTAL PARENTERAL NUTRITION CONSULT NOTE   Indication: Prolonged ileus  Patient Measurements: Height: 5\' 9"  (175.3 cm) Weight: 91.8 kg (202 lb 6.1 oz) IBW/kg (Calculated) : 70.7 TPN AdjBW (KG): 78.4 Body mass index is 29.89 kg/m. Usual Weight: 206-215lb per chart review and wife  Assessment:  78 yo M with acute appendicitis s/p appendectomy with persistent ileus. 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 Patient with little to no intake since admission (9 days) due to procedures and bloody drain output. Patient may be a little confused right now per RN. Spoke with Wife who states patient eats a variety of foods (vegetable soup with beef, hamburgers, fries etc) 5-6 times a day at home. Last good meal was 5/3 prior to admission. Pharmacy consulted for TPN.   Glucose / Insulin : BG 194-245 since TPN hung, A1C 7.3%, used 34 units SSI/24hr and 43 units in TPN Home med- glimepiride  Electrolytes: K 3.4 (Received K 30 mEq IV + Kphos 10 mmol), Phos 2.2 [received 10 mmol K phos], Mg 2.0 [received 2 grams iv mag], CoCa 10.34 (none in TPN), others wnl Renal: New HD- last 5/15> next 5/17, BUN 74 Hepatic: wnl, albumin  1.7, TG 228 Intake / Output; MIVF: UOP charted, NGT > clamping, drain 12mL, LBM 5/17 GI Imaging: 5/5 CT: acute appendicitis with possible small abscess  5/8 CT: drain in place, mild SB distension likely mild ileus  5/9 KUB: increased gas in SB and colon, likely adynamic ileus or early pSBO 5/11 KUB: Gas distended small bowel throughout the abdomen, unchanged.  5/12 KUB: Diffuse gaseous distention of bowel loops in the abdomen, SBO vs ileus  5/13 KUB:  Mild diffuse gaseous dilatation of large and small bowel without significant change 5/14 CT: Scattered loops of mildly dilated small bowel, possibly postop ileus  5/14 KUB: Multiple dilated loops of small bowel.  5/16 KUB: Diffuse gaseous SB dilatation within the visualized upper abdomen GI Surgeries /  Procedures:  5/6 laparoscopic appendectomy with JP drain 5/12 NGT 5/14 replace NGT 5/15 replace NGT  Central access: CVC 5/14 TPN start date: 5/14   Nutritional Goals: Goal Concentrated TPN rate is 70 mL/hr (provides 116 g of protein and 2192 kcals per day)  RD Assessment: Estimated Needs Total Energy Estimated Needs: 2150-2350 Total Protein Estimated Needs: 115-135 grams Total Fluid Estimated Needs: UOP + 1L  Current Nutrition:  NPO+ TPN 5/17 CLD  Plan:  Continue concentrated TPN at goal 70 mL/hr at 1800, meeting 100% of estimated needs Electrolytes in TPN: Na 75 mEq/L, K 30 mEq/L, Ca 0 mEq/L, Mg 0 mEq/L, Phos 8 mmol/L. Cl:Ac 1:1 Add standard MVI and trace elements to TPN Increase Resistant q4h SSI and adjust as needed   Increase insulin  45 units to TPN  KCl 10 mEq iv x4  KPhos 15 mmol IV x1 Bmet / Phos w/ AM labs 5/20 Monitor TPN labs daily until stable at goal then on Mon/Thurs   Thank you for involving pharmacy in this patient's care.   Marleta Simmer BS, PharmD, BCPS Clinical Pharmacist 09/13/2023 9:45 AM  Contact: [phone] after 3 PM  "Be curious, not judgmental..." -Rumalda Counter

## 2023-09-13 NOTE — TOC Progression Note (Signed)
 Transition of Care [facility]) - Progression Note    Patient Details  Name: Christian [name] MRN: [id number] Date of Birth: 1945-05-16  [facility]) CM/SW Contact  Tandy Fam, [place] Phone Number: 09/13/2023, 3:15 PM  Clinical Narrative:   CSW met with patient's spouse, Felipa Horsfall, to check in on research for SNF placement. Felipa Horsfall said she's leaning towards [facility], but hasn't made it over for a tour yet. CSW answered questions. Patient not medically stable for discharge at this time.    Expected Discharge Plan: Skilled Nursing Facility Barriers to Discharge: Continued Medical Work up, English as a second language teacher  Expected Discharge Plan and Services                                               Social Determinants of Health (SDOH) Interventions SDOH Screenings   Food Insecurity: No Food Insecurity (08/31/2023)  Housing: Low Risk  (08/31/2023)  Transportation Needs: No Transportation Needs (08/31/2023)  Utilities: Not At Risk (08/31/2023)  Alcohol Screen: Low Risk  (07/23/2021)  Depression (PHQ2-9): Low Risk  (01/18/2023)  Financial Resource Strain: Low Risk  (07/23/2021)  Physical Activity: Inactive (07/23/2021)  Social Connections: Moderately Integrated (08/31/2023)  Stress: No Stress Concern Present (07/23/2021)  Tobacco Use: Medium Risk (08/31/2023)    Readmission Risk Interventions     No data to display

## 2023-09-13 NOTE — Progress Notes (Signed)
 [facility]                                                                               Christian [name], is a 78 y.o. male, DOB - [birth year], ZOX:[id number] Admit date - 08/30/2023    Outpatient Primary MD for the patient is Christian [place], MD  LOS - 13  days    Brief summary    78 y.o. yo male with CAD status post CABG, postoperative CVA and renal failure , underwent laparoscopic appendectomy for perforated appendicitis by Dr Aniceto Barley on 5/6 developed postop ileus, s/p JP drain and has a NG tube on suction,  AKI on CKD stage 4, currently on HD. Patient had a old AV fistula , was unable to cannulate , underwent RIJ temp HD cath by IR on 5/14.  Recent CT abd and pelvis on 5/14 shows JP drain with a hematoma, with persistent post op ileus with dilated small bowel.  Patient reports being nauseated NGT connected to suction. Patient febrile this morning.   Assessment & Plan    Assessment and Plan:  Appendicitis with possible periappendiceal abscess Post op ileus - s/p lap appendectomy on 5/6 - continue zosyn  as per general surgery. Day 13 of IV zosyn . Plan to d.c antibiotics after 14 days.  - drain remains in place with dark sanguinous output .  - Poor intake and no sign of ileus improving.  Surgery recommending TPN which was started on 5/14. - patient underwent Placement of a right IJ approach triple lumen temp HD cath  on 5/14 as putting in a tunneled HD catheter at this time with leukocytosis might be risky and more prone to infection.  - continue NGT for decompression ,pain controlled.  - gen surgery, IR, vascular surgery on board.  - CT abdomen and pelvis shows complex 5.5 by 11.7 by 4.3 cm collection adjacent to the appendectomy site. - continue with TPN. - NGT to be connected LWIS.  - pt febrile and leukocytosis slightly worse than yesterday.  - ID consulted for persistent leukocytosis and fever despite pt being on 13 days of zosyn .    Acute on CKDIV ATN -  patient has history of CKD4. Baseline creat ~ 3.2 - 3.5, eGFR~ 17 - he has required HD the past - possibly volume depletion from surgery/blood loss and when he had severe hypoTN on 5/7; I don't think due to zosyn  but AIN always a possibility as well  - seems to be ATN with uptrending creat/BUN; has been slowly approaching need for dialysis - Nephrology on board.  - Vascular surgery consulted; appreciate assistance.  Fistulogram performed 09/07/2023 and balloon venoplasty performed and left arm fistula ready for use.   Unfortunately left arm fistula cannot be cannulated at this time.  - underwent RIJ temp HD cath by IR on 5/14.  - undergoing HD as per nephrology.  - no signs of renal recovery so far.  - further management as per nephrology.     ABLA Hemoglobin  dropped from 10 TO 8.5 to 8.4 to 7.5.  Hemoglobin improved to 8.7 post transfusion of 1 unit.  Patient has dark sanguinous output from JP drain.  HTN Well controlled.  Currently on metoprolol  5 mg every 6 hours, increase it to 10 mg every 6 hours.  Added hydralazine  10 mg every 8 hours prn.    Type 2 DM; well controlled CBG'S CBG (last 3)  Recent Labs    09/13/23 0304 09/13/23 0800 09/13/23 1238  GLUCAP 222* 208* 230*   Resume SSI. Insulin  in the tpn, managed by pharmacy.    CAD - s/p CABG - cleared by cardiology prior to surgery  Thrombocytosis Monitor.     Hypokalemia  Replaced.     Malnutrition Type:  Nutrition Problem: Inadequate oral intake Etiology: inability to eat   Malnutrition Characteristics:  Signs/Symptoms: NPO status   Nutrition Interventions:  Interventions: TPN  Estimated body mass index is 29.89 kg/m as calculated from the following:   Height as of this encounter: 5\' 9"  (1.753 m).   Weight as of this encounter: 91.8 kg.  Code Status: full code.  DVT Prophylaxis:  heparin  injection 5,000 Units Start: 09/09/23 1400 Place and maintain sequential compression device Start:  09/01/23 2001 SCDs Start: 08/31/23 0415   Level of Care: Level of care: Progressive Family Communication: None at bedside.   Disposition Plan:     Remains inpatient appropriate:  Pending clinical improvement.    Procedures:  Lap appendectomy.   Consultants:   General surgery Nephrology Vascular surgery.  IR.   Antimicrobials:   Anti-infectives (From admission, onward)    Start     Dose/Rate Route Frequency Ordered Stop   08/31/23 2200  piperacillin -tazobactam (ZOSYN ) IVPB 2.25 g  Status:  Discontinued        2.25 g 100 mL/hr over 30 Minutes Intravenous Every 8 hours 08/31/23 1953 09/13/23 0858   08/31/23 0600  piperacillin -tazobactam (ZOSYN ) IVPB 3.375 g  Status:  Discontinued        3.375 g 100 mL/hr over 30 Minutes Intravenous Every 8 hours 08/31/23 0532 08/31/23 1953   08/30/23 2245  piperacillin -tazobactam (ZOSYN ) IVPB 3.375 g        3.375 g 100 mL/hr over 30 Minutes Intravenous  Once 08/30/23 2233 08/31/23 0005        Medications  Scheduled Meds:  Chlorhexidine  Gluconate Cloth  6 each Topical Q0600   Chlorhexidine  Gluconate Cloth  6 each Topical Q0600   fluticasone  furoate-vilanterol  1 puff Inhalation Daily   heparin  injection (subcutaneous)  5,000 Units Subcutaneous Q8H   insulin  aspart  0-20 Units Subcutaneous Q4H   metoprolol  tartrate  10 mg Intravenous Q6H   sodium chloride  flush  3 mL Intravenous Q12H   Continuous Infusions:  potassium chloride  10 mEq (09/13/23 1220)   potassium PHOSPHATE IVPB (in mmol) 15 mmol (09/13/23 1121)   TPN ADULT (ION) 70 mL/hr at 09/13/23 1100   TPN ADULT (ION)     PRN Meds:.albuterol , dextrose , hydrALAZINE , HYDROmorphone  (DILAUDID ) injection, LORazepam , methocarbamol  (ROBAXIN ) injection, metoprolol  tartrate, naLOXone  (NARCAN )  injection, ondansetron  (ZOFRAN ) IV, simethicone , sodium chloride  flush, traMADol     Subjective:   [name] was seen and examined today. Nauseated. NGT on suction.    Objective:    Vitals:   09/13/23 0305 09/13/23 0500 09/13/23 0803 09/13/23 1200  BP: 135/65  (!) 148/61 (!) 151/71  Pulse: 96  94 100  Resp: 18  (!) 22 (!) 26  Temp: (!) 100.8 F (38.2 C)  98.8 F (37.1 C) 99 F (37.2 C)  TempSrc: Oral  Oral Axillary  SpO2: 100%  100% 100%  Weight:  91.8 kg    Height:  Intake/Output Summary (Last 24 hours) at 09/13/2023 1302 Last data filed at 09/13/2023 1220 Gross per 24 hour  Intake 3484 ml  Output 2062 ml  Net 1422 ml   Filed Weights   09/11/23 0500 09/12/23 0500 09/13/23 0500  Weight: 89.6 kg 89.8 kg 91.8 kg     Exam General exam: Ill appearing gentleman, not in distress.  Respiratory system: diminished at bases Cardiovascular system: S1 & S2 heard, RRR. No JVD Gastrointestinal system: Abdomen is soft , distended. Llq JP drain with dark sanguinous fluid.  Central nervous system: Alert and oriented to person and place Extremities: warm extremities.  Skin: No rashes, Psychiatry:  mood is appropriate.        Data Reviewed:  I have personally reviewed following labs and imaging studies   CBC Lab Results  Component Value Date   WBC 12.8 (H) 09/13/2023   RBC 2.69 (L) 09/13/2023   HGB 8.7 (L) 09/13/2023   HCT 26.9 (L) 09/13/2023   MCV 100.0 09/13/2023   MCH 32.3 09/13/2023   PLT 375 09/13/2023   MCHC 32.3 09/13/2023   RDW 17.4 (H) 09/13/2023   LYMPHSABS 0.9 09/13/2023   MONOABS 1.1 (H) 09/13/2023   EOSABS 0.1 09/13/2023   BASOSABS 0.1 09/13/2023     Last metabolic panel Lab Results  Component Value Date   NA 140 09/13/2023   K 3.4 (L) 09/13/2023   CL 99 09/13/2023   CO2 28 09/13/2023   BUN 74 (H) 09/13/2023   CREATININE 4.59 (H) 09/13/2023   GLUCOSE 209 (H) 09/13/2023   GFRNONAA 13 (L) 09/13/2023   GFRAA 19 (L) 10/18/2019   CALCIUM  8.5 (L) 09/13/2023   PHOS 2.2 (L) 09/13/2023   PROT 5.9 (L) 09/13/2023   ALBUMIN  1.7 (L) 09/13/2023   LABGLOB 2.4 05/22/2021   AGRATIO 1.9 05/22/2021   BILITOT 1.7 (H) 09/13/2023    ALKPHOS 83 09/13/2023   AST 70 (H) 09/13/2023   ALT 45 (H) 09/13/2023   ANIONGAP 13 09/13/2023    CBG (last 3)  Recent Labs    09/13/23 0304 09/13/23 0800 09/13/23 1238  GLUCAP 222* 208* 230*      Coagulation Profile: No results for input(s): "INR", "PROTIME" in the last 168 hours.   Radiology Studies: No results found.      Feliciana Horn M.D. [facility] 09/13/2023, 1:02 PM  Available via Epic secure chat 7am-7pm After 7 pm, please refer to night coverage provider listed on amion.

## 2023-09-13 NOTE — Progress Notes (Addendum)
 6 Days Post-Op   Subjective/Chief Complaint: NG remains high output 3 BMs last 24h. Described as loose and non-bloody   TMAX 100.8 HR 96 bpm  WBC 12 from 11 Hgb 8.7 from 7.5 s/p 1 u pRBC yesterday 5/18, appropriate rise Objective: Vital signs in last 24 hours: Temp:  [98.5 F (36.9 C)-100.8 F (38.2 C)] 100.8 F (38.2 C) (05/19 0305) Pulse Rate:  [91-103] 96 (05/19 0305) Resp:  [17-26] 18 (05/19 0305) BP: (120-161)/(50-72) 135/65 (05/19 0305) SpO2:  [93 %-100 %] 100 % (05/19 0305) Weight:  [91.8 kg] 91.8 kg (05/19 0500) Last BM Date : 09/11/23  Intake/Output from previous day: 05/18 0701 - 05/19 0700 In: 2326.7 [I.V.:1727.5; Blood:404.2; IV Piggyback:195] Out: 1712 [Urine:300; Emesis/NG output:1400; Drains:12] Intake/Output this shift: No intake/output data recorded.  Gen: alert, appears fatigued after moving around in bed to clean his sheets after a BM Pulm: slightly labored on [place] Abd: less distended, protuberant, LLQ drain with scant dark bloody drainage, only 12cc of output, RLQ tenderness as expected  NG - 1400 mL, brown  Lab Results:  Recent Labs    09/11/23 0546 09/11/23 2140  WBC 12.6* 11.9*  HGB 8.4* 7.5*  HCT 25.6* 23.5*  PLT 420* 379   BMET Recent Labs    09/12/23 0746 09/13/23 0559  NA 137 140  K 3.4* 3.4*  CL 95* 99  CO2 29 28  GLUCOSE 245* 209*  BUN 53* 74*  CREATININE 3.78* 4.59*  CALCIUM  8.4* 8.5*   PT/INR No results for input(s): "LABPROT", "INR" in the last 72 hours. ABG No results for input(s): "PHART", "HCO3" in the last 72 hours.  Invalid input(s): "PCO2", "PO2"  Studies/Results: No results found.   Anti-infectives: Anti-infectives (From admission, onward)    Start     Dose/Rate Route Frequency Ordered Stop   08/31/23 2200  piperacillin -tazobactam (ZOSYN ) IVPB 2.25 g        2.25 g 100 mL/hr over 30 Minutes Intravenous Every 8 hours 08/31/23 1953     08/31/23 0600  piperacillin -tazobactam (ZOSYN ) IVPB 3.375 g  Status:   Discontinued        3.375 g 100 mL/hr over 30 Minutes Intravenous Every 8 hours 08/31/23 0532 08/31/23 1953   08/30/23 2245  piperacillin -tazobactam (ZOSYN ) IVPB 3.375 g        3.375 g 100 mL/hr over 30 Minutes Intravenous  Once 08/30/23 2233 08/31/23 0005       Assessment/Plan: POD 13 - s/p lap appy with JP drain placement for perforated appendicitis [name] 5/6  - started having bowel function over weekend, still high NG output. Will check on him this afternoon and continue efforts at NG clamp trials. - CT abd pelvis 5/14 shows JP within a hematoma, post-op ileus with dilated small bowel, bilateral R>L pleural effusions.   - WBC stable, low grade temp this AM. Last CT was 5 days ago. Have been leaving antibiotics on due to persistent leukocytosis without definitive source. D/C abx today and follow clinically. DDx: pneumonia, infected intra-abdominal hematoma  - dark sanguinous output from JP drain - low output, can likely remove this soon. - encourage OOB, ambulation - noted plans for HD tomorrow 5/20   FEN - NGT LIWS, TPN started 5/14 Lines/tubes - temp HD cath R internal jugular, PIV x2 RUE, LUE w/ AV Fistula (non-functional), NGT to be replaced, LLE JP drain Foley - in placed, clear yellow urine VTE - heparin /plavix  on hold ID - Zosyn ; WBC 12, stable. Low grade temp. Stop zosyn   todayClaudis Cumber Chaka [name] 09/13/2023

## 2023-09-13 NOTE — Progress Notes (Signed)
 Patient tolerating NG tube being clamped since 1245.  Patient has no complaints of Nausea, no increased abdominal distention, or other adverse effects since NG tube clamped.   However, patient has had minimal PO intake.  Patient has only had some ice chips and a few sips of coffee.   Discussed with PA [place].  PA advised to keep NG tube in place and clamped at this time allowing patient to have clear liquids.   PA advised for night shift to place NG tube to low intermittent suction at bedtime and PA will reassess NG tube for removal in the morning.

## 2023-09-13 NOTE — Consult Note (Signed)
 [facility]    Date of Admission:  08/30/2023     Reason for Consult: fever, appy   Referring Provider: Nichole Barker     Lines:  Right internal jugular Old left avf ngt  Abx: 5/5-19 piptazo        Assessment: 78 yo male with ckd4, cad, here with appendicitis s/p lap appy 5/06, with drain placement   Purulence fluid found during lap appy and drain placed 5/6. Surgery following Still febrile 5/19; wbc trend overall down and stable  On tpn due to post-op illeus  Loose end to check --> repeat bcx to look for candidaemia; repeat abd ct to see If enlarging or new abscesses  Plan: Can deescalate abx to amp-sulb Abd pelv ct noncontrast as renal thinks this is acute aki  Repeat bcx Maintain standard isolation precaution Discussed with primary team       ------------------------------------------------ Principal Problem:   Appendicitis Active Problems:   HTN (hypertension)   Type II diabetes mellitus   CAD (coronary artery disease)   Ischemic cardiomyopathy   S/P CABG x 4   Acute renal failure superimposed on stage 4 chronic kidney disease (HCC)   ABLA (acute blood loss anemia)    HPI: Christian [name] is a 79 y.o. male ckd4, cad, here with appendicitis complicated   Patient still complains of significant abd pain right side; drain is still draining dark fluid Fever ongoing Passing gas today 09/13/23 On tpn for ileus Wbc overall down  On piptazo  Has hd line right internal jugular  Old lue avf canulated this admission for stenosis but nonfunctional still  Aki and started on hd here -- followed by nephrology  Bcx 5/8 negative; no abd fluid culture  Family History  Problem Relation Age of Onset   Diabetes Mother    Hypertension Mother    Diabetes Father    Diabetes Brother    Hypertension Brother    Bone cancer Brother    Diabetes Brother     Social History   Tobacco Use   Smoking status: Former    Current  packs/day: 0.00    Average packs/day: 0.5 packs/day for 20.0 years (10.0 ttl pk-yrs)    Types: Cigarettes    Start date: 15    Quit date: 1975    Years since quitting: 50.4   Smokeless tobacco: Never  Vaping Use   Vaping status: Never Used  Substance Use Topics   Alcohol use: Not Currently    Comment: very rarely - every now and then 1 drink/1 year if that   Drug use: No    No Known Allergies  Review of Systems: ROS All Other ROS was negative, except mentioned above   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   Diabetic polyneuropathy    Elevated liver enzymes 08/09/2019   Hiatal hernia    History of coronary artery stent placement    History of pulmonary embolism 08/09/2019   HTN (hypertension) 11/12/2009   Hyperlipidemia associated with type 2 diabetes mellitus 11/12/2009   Left leg weakness 11/23/2018   Mild dementia, 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 LIMA TO LAD, RIMA TO PDA, RADIAL ARTERY TO CIRC AND SEQUENTIALLY TO OM1.   Sleep difficulties 05/30/2020   Type II diabetes mellitus 10/10/2014       Scheduled Meds:  Chlorhexidine  Gluconate Cloth  6 each Topical Q0600   Chlorhexidine  Gluconate Cloth  6 each Topical Q0600   fluticasone  furoate-vilanterol  1 puff Inhalation Daily   heparin  injection (subcutaneous)  5,000 Units Subcutaneous Q8H   insulin  aspart  0-20 Units Subcutaneous Q4H   metoprolol  tartrate  10 mg Intravenous Q6H   sodium chloride  flush  3 mL Intravenous Q12H   Continuous Infusions:   TPN ADULT (ION) 70 mL/hr at 09/13/23 1700   TPN ADULT (ION) 70 mL/hr at 09/13/23 1735   PRN Meds:.albuterol , dextrose , hydrALAZINE , HYDROmorphone  (DILAUDID ) injection, LORazepam , methocarbamol  (ROBAXIN ) injection, metoprolol  tartrate, naLOXone  (NARCAN )  injection, ondansetron  (ZOFRAN ) IV, simethicone , sodium chloride  flush, traMADol    OBJECTIVE: Blood pressure (!) 145/78, pulse (!) 102, temperature 99.1 F (37.3 C), temperature source Axillary, resp. rate (!) 30, height 5\' 9"  (1.753 m), weight 91.8 kg, SpO2 99%.  Physical Exam  General/constitutional: no distress, pleasant; ngt in place HEENT: Normocephalic, PER, Conj Clear, EOMI, Oropharynx clear Neck supple CV: rrr no mrg Lungs: clear to auscultation, normal respiratory effort Abd: Soft, slightly distended; tender right side; left quadrant drain with thick serosanguinous fluid Ext: no edema Skin: No Rash Neuro: nonfocal MSK: no peripheral joint swelling/tenderness/warmth; back spines nontender   Central line presence: right internal jugular site no purulence/bleeding   Lab Results Lab Results  Component Value Date   WBC 12.8 (H) 09/13/2023   HGB 8.7 (L) 09/13/2023   HCT 26.9 (L) 09/13/2023   MCV 100.0 09/13/2023   PLT 375 09/13/2023    Lab Results  Component Value Date   CREATININE 4.59 (H) 09/13/2023   BUN 74 (H) 09/13/2023   NA 140 09/13/2023   K 3.4 (L) 09/13/2023   CL 99 09/13/2023   CO2 28 09/13/2023    Lab Results  Component Value Date   ALT 45 (H) 09/13/2023   AST 70 (H) 09/13/2023   ALKPHOS 83 09/13/2023   BILITOT 1.7 (H) 09/13/2023      Microbiology: No results found for this or any previous visit (from the past 240 hours).   Serology:    Imaging: If present, new imagings (plain films, ct scans, and mri) have been personally visualized and interpreted; radiology reports have been reviewed. Decision making incorporated into the Impression / Recommendations.  5/14 ct abd pelv with contrast 1.  The drain terminates within a complex 5.5 by 11.7 by 4.3 cm (volume = 140 cc) collection immediately adjacent to a loop of small bowel in the right lower quadrant, and extending up adjacent to the appendectomy site. This collection was smaller on the prior exam. Is the drain fluid bloody or purulent? There are other sites of complex fluid in the pelvis and adjacent to the sigmoid colon possibly from hemoperitoneum, and reduced complex fluid along the left paracolic gutter compared to previous. 2. Scattered loops of mildly dilated small bowel up to about 3.5 cm in diameter, potentially from postoperative ileus, correlate with return of bowel function after surgery. 3. Moderate right and small left pleural effusions with pleural enhancement and passive atelectasis. 4. Ventral hernia containing mediastinal and likely upper abdominal adipose tissues extending through a lower sternal defect with abnormal  stranding within the herniated adipose tissues and abnormal stranding in the mediastinal adipose tissue anterior to the pericardium. 5. Rounded atelectasis in the left lower lobe and scarring in the lingula. 6. Dependent density in the gallbladder compatible with either vicarious excretion of contrast (favored) or sludge. 7. 1.7 cm hypodense partially exophytic posterior splenic mass, this may be a hemangioma but is technically nonspecific. 8. Lumbar spondylosis and degenerative disc disease with suspected right foraminal impingement at L2-3 and left foraminal impingement at L4-5. 9. Mild dorsal penoscrotal edema.  Jamesetta Mcbride, MD [facility] [phone] pager    09/13/2023, 5:56 PM

## 2023-09-13 NOTE — Plan of Care (Signed)
 Maintained on TPN, maintained on O2 support, no significant changes happen as of this time. Problem: Coping: Goal: Ability to adjust to condition or change in health will improve Outcome: Progressing   Problem: Fluid Volume: Goal: Ability to maintain a balanced intake and output will improve Outcome: Progressing   Problem: Health Behavior/Discharge Planning: Goal: Ability to manage health-related needs will improve Outcome: Progressing   Problem: Nutritional: Goal: Maintenance of adequate nutrition will improve Outcome: Progressing   Problem: Skin Integrity: Goal: Risk for impaired skin integrity will decrease Outcome: Progressing   Problem: Education: Goal: Knowledge of General Education information will improve Description: Including pain rating scale, medication(s)/side effects and non-pharmacologic comfort measures Outcome: Progressing   Problem: Clinical Measurements: Goal: Will remain free from infection Outcome: Progressing   Problem: Activity: Goal: Risk for activity intolerance will decrease Outcome: Progressing   Problem: Coping: Goal: Level of anxiety will decrease Outcome: Progressing   Problem: Safety: Goal: Ability to remain free from injury will improve Outcome: Progressing   Problem: Pain Managment: Goal: General experience of comfort will improve and/or be controlled Outcome: Progressing

## 2023-09-13 NOTE — Progress Notes (Signed)
 Physical Therapy Treatment Patient Details Name: Christian [name] MRN: [id number] DOB: [birth year] Today's Date: 09/13/2023   History of Present Illness 78 y.o. male admitted 08/30/23 with RLQ pain, appendicitis and perforated appendix s/p laparoscopic appendectomy 5/6. Post op ileus. PMHx: CAD s/p CABG, HTN, CKD, DMT2, BPH, multiple sclerosis, neuropathy, mild dementia    PT Comments  Patient with limited progress as not tolerating mobility or EOB well and unable to even attempt to stand.  Noted some liquid stool during session and pt unable to state his main issue with not being able to stay in sitting.  Assume pain related, but could be due to having BM.  Patient appropriate for inpatient rehab (<3 hours/day) at d/c.     If plan is discharge home, recommend the following: A lot of help with walking and/or transfers;Assistance with cooking/housework;Assist for transportation;Help with stairs or ramp for entrance;Supervision due to cognitive status   Can travel by private vehicle     No  Equipment Recommendations  Hospital bed;Hoyer lift    Recommendations for Other Services       Precautions / Restrictions Precautions Precautions: Fall Recall of Precautions/Restrictions: Impaired Precaution/Restrictions Comments: abdominal incision, JP drain     Mobility  Bed Mobility Overal bed mobility: Needs Assistance Bed Mobility: Rolling, Sidelying to Sit, Supine to Sit, Sit to Supine Rolling: Mod assist, +2 for safety/equipment Sidelying to sit: Used rails, Max assist     Sit to sidelying: Mod assist General bed mobility comments: rolling with A to initiate and to place hand on rail; up to sit EOB lifting trunk and for legs off EOB; pt initiating to lie down prior to end of session though assisted for legs onto bed and +2 to scoot up    Transfers                   General transfer comment: did not allow sit to stand as laying back down after on EOB about 8 minutes     Ambulation/Gait                   Stairs             Wheelchair Mobility     Tilt Bed    Modified Rankin (Stroke Patients Only)       Balance Overall balance assessment: Needs assistance Sitting-balance support: Feet supported Sitting balance-Leahy Scale: Poor Sitting balance - Comments: leaning back sitting EOB mod A for balance                                    Communication Communication Communication: No apparent difficulties  Cognition Arousal: Alert Behavior During Therapy: Anxious   PT - Cognitive impairments: Problem solving, Initiation, Safety/Judgement                       PT - Cognition Comments: slow to initiate and for processing, fearful Following commands: Impaired Following commands impaired: Follows one step commands with increased time, Follows one step commands inconsistently    Cueing Cueing Techniques: Verbal cues, Gestural cues, Tactile cues  Exercises      General Comments General comments (skin integrity, edema, etc.): HR 122, family in the room and supportive; incontinent of liquid stool, assist for hygiene      Pertinent Vitals/Pain Pain Assessment Faces Pain Scale: Hurts even more Pain Location: R abdomen Pain Descriptors / Indicators: Discomfort, Grimacing,  Guarding, Moaning Pain Intervention(s): Monitored during session, Repositioned, Limited activity within patient's tolerance    Home Living                          Prior Function            PT Goals (current goals can now be found in the care plan section) Progress towards PT goals: Not progressing toward goals - comment    Frequency    Min 2X/week      PT Plan      Co-evaluation              AM-PAC PT "6 Clicks" Mobility   Outcome Measure  Help needed turning from your back to your side while in a flat bed without using bedrails?: A Lot Help needed moving from lying on your back to sitting on the side  of a flat bed without using bedrails?: Total Help needed moving to and from a bed to a chair (including a wheelchair)?: Total Help needed standing up from a chair using your arms (e.g., wheelchair or bedside chair)?: Total Help needed to walk in hospital room?: Total Help needed climbing 3-5 steps with a railing? : Total 6 Click Score: 7    End of Session Equipment Utilized During Treatment: Other (comment) (NG) Activity Tolerance: Patient limited by pain;Patient limited by fatigue Patient left: in bed;with call bell/phone within reach;with family/visitor present;with bed alarm set   PT Visit Diagnosis: Other abnormalities of gait and mobility (R26.89);Muscle weakness (generalized) (M62.81);Pain Pain - part of body:  (abdomen)     Time: 1340-1407 PT Time Calculation (min) (ACUTE ONLY): 27 min  Charges:    $Therapeutic Activity: 23-37 mins PT General Charges $$ ACUTE PT VISIT: 1 Visit                     Abigail Hoff, PT Acute Rehabilitation Services Office:[phone] 09/13/2023    Christian [name] 09/13/2023, 6:44 PM

## 2023-09-13 NOTE — Progress Notes (Signed)
 [facility] NEPHROLOGY PROGRESS NOTE  Assessment/ Plan: Pt is a 78 y.o. yo male with CAD status post CABG, underwent laparoscopic appendectomy on 5/7 developed postop ileus, AKI on CKD.  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  UA with pyuria and kidney ultrasound without hydronephrosis. No sign of renal recovery with worsening azotemia. First HD on 5/14, second HD on 5/15.  Received third HD 5/17 with 1.5 L UF, tolerated well.  Plan for next HD on 5/20, TTS schedule. Renal navigator is aware of arranging outpatient HD as he gets closer to discharge.  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  On 5/14, the AV fistula is unable to cannulate even with small needle 17G.  S/p RIJ temp HD cath by IR on 5/14. VVS following for perm access and TDC conversion.    # Appendicitis with possible abscess and postop ileus: s/p  antibiotics and drain per surgical team.  TPN for the nutrition  # Acute blood loss anemia, CT angio negative for bleeding.  Transfuse as needed.  # Metabolic acidosis: dc sodium bicarbonate , HD now.  # CKD-MBD, hyperphosphatemia: Phosphorus of 2.2 today, discontinue sevelamer , PTH level 337, continue to monitor.  # Hypokalemia: Managed with HD, monitor lab.   Subjective:  Seen and examined.   Passing some bowel movements but still with high residuals from NG tube, CCS following On  THS schedule  Objective Vital signs in last 24 hours: Vitals:   09/13/23 0305 09/13/23 0500 09/13/23 0803 09/13/23 1200  BP: 135/65  (!) 148/61 (!) 151/71  Pulse: 96  94 100  Resp: 18  (!) 22 (!) 26  Temp: (!) 100.8 F (38.2 C)  98.8 F (37.1 C)   TempSrc: Oral  Oral   SpO2: 100%  100% 100%  Weight:  91.8 kg    Height:       Weight change: 2  kg  Intake/Output Summary (Last 24 hours) at 09/13/2023 1232 Last data filed at 09/13/2023 1100 Gross per 24 hour  Intake 3484 ml  Output 1712 ml  Net 1772 ml       Labs: RENAL PANEL Recent Labs  Lab 09/07/23 0323 09/08/23 0024 09/09/23 0651 09/10/23 1049 09/10/23 1050 09/11/23 0546 09/12/23 0746 09/13/23 0559  NA 136 140 139 139 139 138 137 140  K 3.8 4.1 3.4* 3.1* 3.1* 3.2* 3.4* 3.4*  CL 95* 96* 98 94* 94* 90* 95* 99  CO2 21* 19* 27 30 29 29 29 28   GLUCOSE 72 82 198* 196* 195* 239* 245* 209*  BUN 117* 119* 90* 67* 67* 91* 53* 74*  CREATININE 8.66* 8.87* 6.75* 5.28* 5.32* 5.86* 3.78* 4.59*  CALCIUM  8.1* 8.2* 8.1* 8.4* 8.4* 8.4* 8.4* 8.5*  MG 2.4 2.6* 2.4 2.3  --  2.2 1.8 2.0  PHOS 10.4*  --  6.9* 5.1* 5.1* 3.9 1.8* 2.2*  ALBUMIN  1.7* 1.9* 1.7*  --  1.7*  --   --  1.7*    Liver Function Tests: Recent Labs  Lab 09/08/23 0024 09/09/23 0651 09/10/23 1050 09/13/23 0559  AST 23 29  --  70*  ALT 20 21  --  45*  ALKPHOS 62 65  --  83  BILITOT 2.1* 1.0  --  1.7*  PROT 6.2* 6.0*  --  5.9*  ALBUMIN  1.9* 1.7* 1.7* 1.7*   No results for input(s): "LIPASE", "AMYLASE" in the last 168 hours.  No results for input(s): "AMMONIA" in the last 168 hours. CBC: Recent Labs    01/18/23 1118 04/06/23 0910 09/09/23 1535 09/10/23 1049 09/11/23 0546 09/11/23 2140 09/13/23 0559  HGB  --    < > 8.3* 8.5* 8.4* 7.5* 8.7*  MCV  --    < > 100.0 100.0 100.8* 101.3* 100.0  VITAMINB12 565  --   --   --   --   --   --    < > = values in this interval not displayed.    Cardiac Enzymes: No results for input(s): "CKTOTAL", "CKMB", "CKMBINDEX", "TROPONINI" in the last 168 hours. CBG: Recent Labs  Lab 09/12/23 1502 09/12/23 2036 09/12/23 2345 09/13/23 0304 09/13/23 0800  GLUCAP 226* 194* 200* 222* 208*    Iron Studies: No results for input(s): "IRON", "TIBC", "TRANSFERRIN", "FERRITIN" in the last 72 hours. Studies/Results: No results found.   Medications: Infusions:   potassium chloride  10 mEq (09/13/23 1220)   potassium PHOSPHATE IVPB (in mmol) 15 mmol (09/13/23 1121)   TPN ADULT (ION) 70 mL/hr at 09/13/23 1100   TPN ADULT (ION)      Scheduled Medications:  Chlorhexidine  Gluconate Cloth  6 each Topical Q0600   Chlorhexidine  Gluconate Cloth  6 each Topical Q0600   fluticasone  furoate-vilanterol  1 puff Inhalation Daily   heparin  injection (subcutaneous)  5,000 Units Subcutaneous Q8H   insulin  aspart  0-20 Units Subcutaneous Q4H   metoprolol  tartrate  10 mg Intravenous Q6H   sevelamer  carbonate  0.8 g Oral TID WC   sodium chloride  flush  3 mL Intravenous Q12H    have reviewed scheduled and prn medications.  Physical Exam: General:NAD, he has NG tube. Heart:RRR, s1s2 nl Lungs:clear b/l, no crackle Abdomen:soft, drain present Extremities:No edema Neurology: Alert, awake and following commands Vascular Access: AV fistula is quite deep, thrill positive. Temp HD cath.  Tamora Huneke B Chela Sutphen 09/13/2023,12:32 PM  LOS: 13 days

## 2023-09-14 DIAGNOSIS — E1169 Type 2 diabetes mellitus with other specified complication: Secondary | ICD-10-CM | POA: Diagnosis not present

## 2023-09-14 DIAGNOSIS — I159 Secondary hypertension, unspecified: Secondary | ICD-10-CM | POA: Diagnosis not present

## 2023-09-14 DIAGNOSIS — D62 Acute posthemorrhagic anemia: Secondary | ICD-10-CM | POA: Diagnosis not present

## 2023-09-14 DIAGNOSIS — K37 Unspecified appendicitis: Secondary | ICD-10-CM | POA: Diagnosis not present

## 2023-09-14 LAB — PHOSPHORUS: Phosphorus: 3.5 mg/dL (ref 2.5–4.6)

## 2023-09-14 LAB — GLUCOSE, CAPILLARY
Glucose-Capillary: 181 mg/dL — ABNORMAL HIGH (ref 70–99)
Glucose-Capillary: 208 mg/dL — ABNORMAL HIGH (ref 70–99)
Glucose-Capillary: 210 mg/dL — ABNORMAL HIGH (ref 70–99)
Glucose-Capillary: 221 mg/dL — ABNORMAL HIGH (ref 70–99)
Glucose-Capillary: 254 mg/dL — ABNORMAL HIGH (ref 70–99)

## 2023-09-14 LAB — CBC
HCT: 27.3 % — ABNORMAL LOW (ref 39.0–52.0)
Hemoglobin: 8.8 g/dL — ABNORMAL LOW (ref 13.0–17.0)
MCH: 32.1 pg (ref 26.0–34.0)
MCHC: 32.2 g/dL (ref 30.0–36.0)
MCV: 99.6 fL (ref 80.0–100.0)
Platelets: 371 10*3/uL (ref 150–400)
RBC: 2.74 MIL/uL — ABNORMAL LOW (ref 4.22–5.81)
RDW: 17.1 % — ABNORMAL HIGH (ref 11.5–15.5)
WBC: 13.2 10*3/uL — ABNORMAL HIGH (ref 4.0–10.5)
nRBC: 0 % (ref 0.0–0.2)

## 2023-09-14 LAB — BASIC METABOLIC PANEL WITH GFR
Anion gap: 16 — ABNORMAL HIGH (ref 5–15)
BUN: 91 mg/dL — ABNORMAL HIGH (ref 8–23)
CO2: 26 mmol/L (ref 22–32)
Calcium: 8.7 mg/dL — ABNORMAL LOW (ref 8.9–10.3)
Chloride: 102 mmol/L (ref 98–111)
Creatinine, Ser: 4.93 mg/dL — ABNORMAL HIGH (ref 0.61–1.24)
GFR, Estimated: 11 mL/min — ABNORMAL LOW (ref >60)
Glucose, Bld: 200 mg/dL — ABNORMAL HIGH (ref 70–99)
Potassium: 3.8 mmol/L (ref 3.5–5.1)
Sodium: 144 mmol/L (ref 135–145)

## 2023-09-14 MED ORDER — TRACE MINERALS CU-MN-SE-ZN 300-55-60-3000 MCG/ML IV SOLN
INTRAVENOUS | Status: AC
  Filled 2023-09-14: qty 772.8

## 2023-09-14 MED ORDER — HYDROMORPHONE HCL 1 MG/ML IJ SOLN
INTRAMUSCULAR | Status: AC
Start: 2023-09-14 — End: ?
  Filled 2023-09-14: qty 0.5

## 2023-09-14 NOTE — Progress Notes (Signed)
 Received patient in bed to unit. Bay 6 Alert and oriented. A&O x4 Informed consent signed and in chart. Yes   TX duration: 3 hours and 30 minutes   Patient tolerated well. Yes Transported back to the room via bed Alert, without acute distress.  Hand-off given to patient's nurse.    Access used: Right  TLIJ Access issues: None   Total UF removed: 2000 Medication(s) given: Dilaudid  IV Post HD weight: 85.1 kg Post HD VS: 107/69, 100 % 2L per N/C, 120 Pulse, 20 Resp,    Jahaira Earnhart S Biance Moncrief RN, DNP [facility] D[facility]

## 2023-09-14 NOTE — Progress Notes (Signed)
 General Surgery Follow Up Note  Subjective:    Overnight Issues:   Objective:  Vital signs for last 24 hours: Temp:  [97.6 F (36.4 C)-99.3 F (37.4 C)] 98.4 F (36.9 C) (05/20 1030) Pulse Rate:  [102-125] 124 (05/20 1200) Resp:  [18-30] 26 (05/20 1200) BP: (105-165)/(51-95) 109/65 (05/20 1200) SpO2:  [97 %-100 %] 100 % (05/20 1200) Weight:  [92 kg-98.2 kg] 92 kg (05/20 1030)  Hemodynamic parameters for last 24 hours:    Intake/Output from previous day: 05/19 0701 - 05/20 0700 In: 2466.9 [P.O.:60; I.V.:1668; IV Piggyback:739] Out: 1080 [Urine:450; Emesis/NG output:600; Drains:30]  Intake/Output this shift: Total I/O In: 200 [P.O.:200] Out: 125 [Urine:125]  Vent settings for last 24 hours:    Physical Exam:  Gen: comfortable, no distress Neuro: follows commands, alert, communicative HEENT: PERRL Neck: supple CV: RRR Pulm: unlabored breathing on RA Abd: soft, NT, incision clean, dry, intact, JP with old blood GU: HD, foley, oliguria Extr: wwp, no edema  Results for orders placed or performed during the hospital encounter of 08/30/23 (from the past 24 hours)  Glucose, capillary     Status: Abnormal   Collection Time: 09/13/23 12:38 PM  Result Value Ref Range   Glucose-Capillary 230 (H) 70 - 99 mg/dL   Comment 1 Notify RN   Glucose, capillary     Status: Abnormal   Collection Time: 09/13/23  4:21 PM  Result Value Ref Range   Glucose-Capillary 222 (H) 70 - 99 mg/dL   Comment 1 Notify RN   Glucose, capillary     Status: Abnormal   Collection Time: 09/13/23  8:06 PM  Result Value Ref Range   Glucose-Capillary 223 (H) 70 - 99 mg/dL   Comment 1 Notify RN   Glucose, capillary     Status: Abnormal   Collection Time: 09/13/23 11:45 PM  Result Value Ref Range   Glucose-Capillary 190 (H) 70 - 99 mg/dL   Comment 1 Notify RN   Glucose, capillary     Status: Abnormal   Collection Time: 09/14/23  3:52 AM  Result Value Ref Range   Glucose-Capillary 208 (H) 70 - 99  mg/dL   Comment 1 Notify RN   CBC     Status: Abnormal   Collection Time: 09/14/23  6:46 AM  Result Value Ref Range   WBC 13.2 (H) 4.0 - 10.5 K/uL   RBC 2.74 (L) 4.22 - 5.81 MIL/uL   Hemoglobin 8.8 (L) 13.0 - 17.0 g/dL   HCT 56.2 (L) 13.0 - 86.5 %   MCV 99.6 80.0 - 100.0 fL   MCH 32.1 26.0 - 34.0 pg   MCHC 32.2 30.0 - 36.0 g/dL   RDW 78.4 (H) 69.6 - 29.5 %   Platelets 371 150 - 400 K/uL   nRBC 0.0 0.0 - 0.2 %  Basic metabolic panel     Status: Abnormal   Collection Time: 09/14/23  6:46 AM  Result Value Ref Range   Sodium 144 135 - 145 mmol/L   Potassium 3.8 3.5 - 5.1 mmol/L   Chloride 102 98 - 111 mmol/L   CO2 26 22 - 32 mmol/L   Glucose, Bld 200 (H) 70 - 99 mg/dL   BUN 91 (H) 8 - 23 mg/dL   Creatinine, Ser 2.84 (H) 0.61 - 1.24 mg/dL   Calcium  8.7 (L) 8.9 - 10.3 mg/dL   GFR, Estimated 11 (L) >60 mL/min   Anion gap 16 (H) 5 - 15  Phosphorus     Status: None  Collection Time: 09/14/23  6:46 AM  Result Value Ref Range   Phosphorus 3.5 2.5 - 4.6 mg/dL  Glucose, capillary     Status: Abnormal   Collection Time: 09/14/23  7:34 AM  Result Value Ref Range   Glucose-Capillary 181 (H) 70 - 99 mg/dL   Comment 1 QC Due    Comment 2 Notify RN    Comment 3 Document in Chart     Assessment & Plan: The plan of care was discussed with the bedside dialysis nurse for the day, who is in agreement with this plan and no additional concerns were raised.   Present on Admission:  Appendicitis  HTN (hypertension)  Ischemic cardiomyopathy  Acute renal failure superimposed on stage 4 chronic kidney disease (HCC)  CAD (coronary artery disease)  (Resolved) Acute appendicitis  ABLA (acute blood loss anemia)    LOS: 14 days   Additional comments:I reviewed the patient's new clinical lab test results.   and I reviewed the patients new imaging test results.    POD 14 - s/p lap appy with JP drain placement for perforated appendicitis [name] 5/6  - started having bowel function over  weekend, NG output lower in the last 24h. NG clamp trials. - CT abd pelvis 5/14 shows JP within a hematoma, post-op ileus with dilated small bowel, bilateral R>L pleural effusions.    - WBC stable, AF. Last CT was 6 days ago. Abx d/c 5/19, follow clinically. DDx: pneumonia, infected intra-abdominal hematoma   - dark sanguinous output from JP drain - low output, can likely remove this soon. - encourage OOB, ambulation - HD today     FEN - NGT LIWS, TPN started 5/14 Lines/tubes - temp HD cath R internal jugular, PIV x2 RUE, LUE w/ AV Fistula (non-functional), NGT to be replaced, LLE JP drain Foley - in placed, clear yellow urine VTE - heparin /plavix  on hold ID - Zosyn  d/c 5/19; WBC stable.    Anda Bamberg, MD Trauma & General Surgery Please use [email or web address] to contact on call provider  09/14/2023  *Care during the described time interval was provided by me. I have reviewed this patient's available data, including medical history, events of note, physical examination and test results as part of my evaluation.

## 2023-09-14 NOTE — Progress Notes (Signed)
 Pt resting at this time w/ LIWS per PA. Pt's abdomen was tight, firm, with hypoactive sounds. PRN Dilaudid  x1 prior to going to CT for severe pain with movement.   At 2300, accompanied transport and pt to CT. Pt transported back to unit afterwards. No events.  0530: output from NG tube at this time

## 2023-09-14 NOTE — Progress Notes (Signed)
 PHARMACY - TOTAL PARENTERAL NUTRITION CONSULT NOTE   Indication: Prolonged ileus  Patient Measurements: Height: 5\' 9"  (175.3 cm) Weight: 91.8 kg (202 lb 6.1 oz) IBW/kg (Calculated) : 70.7 TPN AdjBW (KG): 78.4 Body mass index is 29.89 kg/m. Usual Weight: 206-215lb per chart review and wife  Assessment:  78 yo M with acute appendicitis s/p appendectomy with persistent ileus. 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 Patient with little to no intake since admission (9 days) due to procedures and bloody drain output. Patient may be a little confused right now per RN. Spoke with Wife who states patient eats a variety of foods (vegetable soup with beef, hamburgers, fries etc) 5-6 times a day at home. Last good meal was 5/3 prior to admission. Pharmacy consulted for TPN.   Glucose / Insulin : CBGs 181-223 since TPN hung, A1C 7.3%, used 39 units SSI/24hr and 45 units in TPN Home med- glimepiride  Electrolytes: K 3.8, Phos 3.5, Mg 2.0, CoCa 10.54 (none in TPN), others wnl 15 mmol of K Phos yesterday 5/19 (sevelamer  d/c'd from [facility], was not receiving)  Renal: New HD - last 5/17, plan for today (5/20). Scr 4.93, BUN 91.  Hepatic: wnl, albumin  1.7, TG 228 Intake / Output; MIVF: UOP 450 mL (0.2 mL/k/h), NGT 600 ml > considering clamp trial, drain 30 mL, LBM 5/19 GI Imaging: 5/5 CT: acute appendicitis with possible small abscess  5/8 CT: drain in place, mild SB distension likely mild ileus  5/9 KUB: increased gas in SB and colon, likely adynamic ileus or early pSBO 5/11 KUB: Gas distended small bowel throughout the abdomen, unchanged.  5/12 KUB: Diffuse gaseous distention of bowel loops in the abdomen, SBO vs ileus  5/13 KUB:  Mild diffuse gaseous dilatation of large and small bowel without significant change 5/14 CT: Scattered loops of mildly dilated small bowel, possibly postop ileus  5/14 KUB: Multiple dilated loops of small bowel.  5/16 KUB: Diffuse gaseous SB dilatation within the  visualized upper abdomen 5/20 CT ab/p: ileus vs partial small bowel obstruction. Slight decrease in dilated bowel loops size.  GI Surgeries / Procedures:  5/6 laparoscopic appendectomy with JP drain 5/12 NGT 5/14 replace NGT 5/15 replace NGT  Central access: CVC 5/14 TPN start date: 5/14   Nutritional Goals: Goal Concentrated TPN rate is 70 mL/hr (provides 116 g of protein and 2192 kcals per day)  RD Assessment: Estimated Needs Total Energy Estimated Needs: 2150-2350 Total Protein Estimated Needs: 115-135 grams Total Fluid Estimated Needs: UOP + 1L  Current Nutrition:  NPO+ TPN 5/17 CLD  Plan:  Continue concentrated TPN at goal 70 mL/hr at 1800, meeting 100% of estimated needs Electrolytes in TPN: Na 75 mEq/L, K 30 mEq/L, Ca 0 mEq/L, Mg 0 mEq/L, Phos 8 mmol/L. Cl:Ac 1:1.  Add standard MVI and trace elements to TPN Increase Resistant q4h SSI and adjust as needed   Increase insulin  55 units to TPN  RFP / Mg w/ AM labs 5/21 Monitor TPN labs daily until stable at goal then on Mon/Thurs  Thank you for involving pharmacy in this patient's care.  Patience Bonito, PharmD, BCPS, BCCCP Clinical Pharmacist

## 2023-09-14 NOTE — Progress Notes (Signed)
 Christian [name] - Patient off unit for Hemodialyis.  1430 - patient back to unit from Hemodialysis.   Patient's family bedside, update provided and patient checked back in to patient's room.

## 2023-09-14 NOTE — Plan of Care (Signed)
 Id brief note  Discussed case with TR[facility]/surgery Surgery team suspect intraabd hematoma and had removed drain/dc'ed abx.  Bcx pending for recurrent fever and if negative agree can hold abx and observe  Will f/u blood cx and reengage as needed

## 2023-09-14 NOTE — Plan of Care (Signed)
  Problem: Coping: Goal: Ability to adjust to condition or change in health will improve Outcome: Progressing   Problem: Fluid Volume: Goal: Ability to maintain a balanced intake and output will improve Outcome: Progressing   Problem: Metabolic: Goal: Ability to maintain appropriate glucose levels will improve Outcome: Progressing   Problem: Nutritional: Goal: Maintenance of adequate nutrition will improve Outcome: Progressing   Problem: Clinical Measurements: Goal: Ability to maintain clinical measurements within normal limits will improve Outcome: Progressing   Problem: Elimination: Goal: Will not experience complications related to bowel motility Outcome: Progressing Goal: Will not experience complications related to urinary retention Outcome: Progressing

## 2023-09-14 NOTE — Procedures (Signed)
 I was present at this dialysis session. I have reviewed the session and made appropriate changes.   3K bath UF goal 2L using R internal jugular Temp HD Cath.  Uncomfortable in bed.  Says had BM and Flatus yesterday.  WBC about the same.  No fevers overnight.  Next HD 5/22, need ID issues resolved prior to [facility] conversion.    P up a tad still holding binders.   Filed Weights   09/12/23 0500 09/13/23 0500 09/14/23 0849  Weight: 89.8 kg 91.8 kg 98.2 kg    Recent Labs  Lab 09/14/23 0646  NA 144  K 3.8  CL 102  CO2 26  GLUCOSE 200*  BUN 91*  CREATININE 4.93*  CALCIUM  8.7*  PHOS 3.5    Recent Labs  Lab 09/10/23 1049 09/11/23 0546 09/11/23 2140 09/13/23 0559 09/14/23 0646  WBC 13.8* 12.6* 11.9* 12.8* 13.2*  NEUTROABS 11.2* 10.3*  --  9.5*  --   HGB 8.5* 8.4* 7.5* 8.7* 8.8*  HCT 25.7* 25.6* 23.5* 26.9* 27.3*  MCV 100.0 100.8* 101.3* 100.0 99.6  PLT 411* 420* 379 375 371    Scheduled Meds:  Chlorhexidine  Gluconate Cloth  6 each Topical Q0600   Chlorhexidine  Gluconate Cloth  6 each Topical Q0600   fluticasone  furoate-vilanterol  1 puff Inhalation Daily   heparin  injection (subcutaneous)  5,000 Units Subcutaneous Q8H   insulin  aspart  0-20 Units Subcutaneous Q4H   metoprolol  tartrate  10 mg Intravenous Q6H   sodium chloride  flush  3 mL Intravenous Q12H   Continuous Infusions:  ampicillin -sulbactam (UNASYN ) IV 1.5 g (09/14/23 0729)   TPN ADULT (ION) 70 mL/hr at 09/14/23 0700   TPN ADULT (ION)     PRN Meds:.albuterol , dextrose , hydrALAZINE , HYDROmorphone  (DILAUDID ) injection, LORazepam , methocarbamol  (ROBAXIN ) injection, metoprolol  tartrate, naLOXone  (NARCAN )  injection, ondansetron  (ZOFRAN ) IV, simethicone , sodium chloride  flush, traMADol    Fawn Hooks  MD 09/14/2023, 9:18 AM

## 2023-09-14 NOTE — Progress Notes (Signed)
 [facility]                                                                               Vishal Sandlin, is a 78 y.o. male, DOB - [birth year], ZOX:[id number] Admit date - 08/30/2023    Outpatient Primary MD for the patient is [name], MD  LOS - 14  days    Brief summary    78 y.o. yo male with CAD status post CABG, postoperative CVA and renal failure , underwent laparoscopic appendectomy for perforated appendicitis by Dr Aniceto Barley on 5/6 developed postop ileus, s/p JP drain and has a NG tube on suction,  AKI on CKD stage 4, currently on HD. Patient had a old AV fistula , was unable to cannulate , underwent RIJ temp HD cath by IR on 5/14.  Recent CT abd and pelvis on 5/14 shows JP drain with a hematoma, with persistent post op ileus with dilated small bowel.  Patient reports being nauseated NGT connected to suction. No issues overnight.   Assessment & Plan    Assessment and Plan:  Appendicitis with possible periappendiceal abscess Post op ileus - s/p lap appendectomy on 5/6 - completed 13 days of IV Zosyn   - drain remains in place with  minimal dark sanguinous output .  - Poor intake and ileus slowly improving. Surgery recommending TPN which was started on 5/14. - patient underwent Placement of a right IJ approach triple lumen temp HD cath  on 5/14 as putting in a tunneled HD catheter at this time with leukocytosis might be risky and more prone to infection.  - continue NGT for decompression ,pain controlled.  - gen surgery, IR, vascular surgery on board.  - Repeat CT abdomen and pelvis on 5/14 shows complex 5.5 by 11.7 by 4.3 cm collection adjacent to the appendectomy site. - ID consulted for persistent leukocytosis and fever despite IV antibiotics. A rpt CT abd and pelvis without contrast showed large  right lower quadrant complex fluid collection. General surgery suggests the complex fluid collection is probably a hematoma rather than abscess. The JP drain  is not draining much, will probably come out in the next few days.     Acute on CKDIV ATN - patient has history of CKD4. Baseline creat ~ 3.2 - 3.5, eGFR~ 17 - he has required HD the past - possibly volume depletion from surgery/blood loss and when he had severe hypoTN on 5/7; - Vascular surgery consulted; appreciate assistance.  Fistulogram performed 09/07/2023 and balloon venoplasty performed and left arm fistula ready for use.   Unfortunately left arm fistula cannot be cannulated at this time.  - underwent RIJ temp HD cath by IR on 5/14.  - undergoing HD as per nephrology.  - no signs of renal recovery so far.  - further management as per nephrology.     ABLA Hemoglobin  dropped from 10 TO 8.5 to 8.4 to 7.5.  Hemoglobin improved to 8.7 post transfusion of 1 unit. Hemoglobin is stable around 8.8.  Patient has dark sanguinous output from JP drain.     HTN Well controlled.  Currently on metoprolol  5 mg every 6 hours, increase it to 10 mg  every 6 hours.  Added hydralazine  10 mg every 8 hours prn.    Type 2 DM; well controlled CBG'S CBG (last 3)  Recent Labs    09/14/23 0352 09/14/23 0734 09/14/23 1455  GLUCAP 208* 181* 210*   Resume SSI. Insulin  in the tpn, managed by pharmacy.    CAD - s/p CABG - cleared by cardiology prior to surgery  Thrombocytosis Monitor.     Hypokalemia  Replaced.     Malnutrition Type:  Nutrition Problem: Inadequate oral intake Etiology: inability to eat   Malnutrition Characteristics:  Signs/Symptoms: NPO status   Nutrition Interventions:  Interventions: TPN  Estimated body mass index is 27.71 kg/m as calculated from the following:   Height as of this encounter: 5\' 9"  (1.753 m).   Weight as of this encounter: 85.1 kg.  Code Status: full code.  DVT Prophylaxis:  heparin  injection 5,000 Units Start: 09/09/23 1400 Place and maintain sequential compression device Start: 09/01/23 2001 SCDs Start: 08/31/23 0415   Level of  Care: Level of care: Progressive Family Communication: None at bedside.   Disposition Plan:     Remains inpatient appropriate:  Pending clinical improvement.    Procedures:  Lap appendectomy.   Consultants:   General surgery Nephrology Vascular surgery.  IR.   Antimicrobials:   Anti-infectives (From admission, onward)    Start     Dose/Rate Route Frequency Ordered Stop   09/13/23 2000  ampicillin -sulbactam (UNASYN ) 1.5 g in sodium chloride  0.9 % 100 mL IVPB        1.5 g 200 mL/hr over 30 Minutes Intravenous Every 12 hours 09/13/23 1817     08/31/23 2200  piperacillin -tazobactam (ZOSYN ) IVPB 2.25 g  Status:  Discontinued        2.25 g 100 mL/hr over 30 Minutes Intravenous Every 8 hours 08/31/23 1953 09/13/23 0858   08/31/23 0600  piperacillin -tazobactam (ZOSYN ) IVPB 3.375 g  Status:  Discontinued        3.375 g 100 mL/hr over 30 Minutes Intravenous Every 8 hours 08/31/23 0532 08/31/23 1953   08/30/23 2245  piperacillin -tazobactam (ZOSYN ) IVPB 3.375 g        3.375 g 100 mL/hr over 30 Minutes Intravenous  Once 08/30/23 2233 08/31/23 0005        Medications  Scheduled Meds:  Chlorhexidine  Gluconate Cloth  6 each Topical Q0600   Chlorhexidine  Gluconate Cloth  6 each Topical Q0600   fluticasone  furoate-vilanterol  1 puff Inhalation Daily   heparin  injection (subcutaneous)  5,000 Units Subcutaneous Q8H   insulin  aspart  0-20 Units Subcutaneous Q4H   metoprolol  tartrate  10 mg Intravenous Q6H   sodium chloride  flush  3 mL Intravenous Q12H   Continuous Infusions:  ampicillin -sulbactam (UNASYN ) IV Stopped (09/14/23 0759)   TPN ADULT (ION) 70 mL/hr at 09/14/23 1500   TPN ADULT (ION)     PRN Meds:.albuterol , dextrose , hydrALAZINE , HYDROmorphone  (DILAUDID ) injection, LORazepam , methocarbamol  (ROBAXIN ) injection, metoprolol  tartrate, naLOXone  (NARCAN )  injection, ondansetron  (ZOFRAN ) IV, simethicone , sodium chloride  flush, traMADol     Subjective:   Christian [name] was seen  and examined today. No new complaints.    Objective:   Vitals:   09/14/23 1244 09/14/23 1258 09/14/23 1450 09/14/23 1650  BP: 125/66 107/69 134/69 (!) 147/86  Pulse: (!) 123 (!) 117 (!) 116 (!) 115  Resp: (!) 26 (!) 24 (!) 24 (!) 22  Temp: 98.3 F (36.8 C)  98.6 F (37 C) 98.6 F (37 C)  TempSrc:   Oral Oral  SpO2: 100% 100%  94% 95%  Weight:  85.1 kg    Height:        Intake/Output Summary (Last 24 hours) at 09/14/2023 1736 Last data filed at 09/14/2023 1500 Gross per 24 hour  Intake 1944.09 ml  Output 2995 ml  Net -1050.91 ml   Filed Weights   09/14/23 0849 09/14/23 1030 09/14/23 1258  Weight: 98.2 kg 92 kg 85.1 kg     Exam General exam: ill appearing gentleman, not in distress.  Respiratory system: Clear to auscultation. Respiratory effort normal. Cardiovascular system: S1 & S2 heard, tachycardic, No JVD, Gastrointestinal system: Abdomen is nondistended, soft and nontender.  Central nervous system: Alert and oriented.  Extremities: pedal edema.  Skin: No rashes, Psychiatry: Mood & affect appropriate.         Data Reviewed:  I have personally reviewed following labs and imaging studies   CBC Lab Results  Component Value Date   WBC 13.2 (H) 09/14/2023   RBC 2.74 (L) 09/14/2023   HGB 8.8 (L) 09/14/2023   HCT 27.3 (L) 09/14/2023   MCV 99.6 09/14/2023   MCH 32.1 09/14/2023   PLT 371 09/14/2023   MCHC 32.2 09/14/2023   RDW 17.1 (H) 09/14/2023   LYMPHSABS 0.9 09/13/2023   MONOABS 1.1 (H) 09/13/2023   EOSABS 0.1 09/13/2023   BASOSABS 0.1 09/13/2023     Last metabolic panel Lab Results  Component Value Date   NA 144 09/14/2023   K 3.8 09/14/2023   CL 102 09/14/2023   CO2 26 09/14/2023   BUN 91 (H) 09/14/2023   CREATININE 4.93 (H) 09/14/2023   GLUCOSE 200 (H) 09/14/2023   GFRNONAA 11 (L) 09/14/2023   GFRAA 19 (L) 10/18/2019   CALCIUM  8.7 (L) 09/14/2023   PHOS 3.5 09/14/2023   PROT 5.9 (L) 09/13/2023   ALBUMIN  1.7 (L) 09/13/2023   LABGLOB  2.4 05/22/2021   AGRATIO 1.9 05/22/2021   BILITOT 1.7 (H) 09/13/2023   ALKPHOS 83 09/13/2023   AST 70 (H) 09/13/2023   ALT 45 (H) 09/13/2023   ANIONGAP 16 (H) 09/14/2023    CBG (last 3)  Recent Labs    09/14/23 0352 09/14/23 0734 09/14/23 1455  GLUCAP 208* 181* 210*      Coagulation Profile: No results for input(s): "INR", "PROTIME" in the last 168 hours.   Radiology Studies: CT ABDOMEN PELVIS WO CONTRAST Result Date: 09/13/2023 CLINICAL DATA:  Ongoing fever.  Abscess re-evaluation. EXAM: CT ABDOMEN AND PELVIS WITHOUT CONTRAST TECHNIQUE: Multidetector CT imaging of the abdomen and pelvis was performed following the standard protocol without IV contrast. RADIATION DOSE REDUCTION: This exam was performed according to the departmental dose-optimization program which includes automated exposure control, adjustment of the mA and/or kV according to patient size and/or use of iterative reconstruction technique. COMPARISON:  CT abdomen and pelvis 09/08/2023. FINDINGS: Lower chest: Small bilateral pleural effusions are again seen with atelectasis in the bilateral lower lobes similar to the prior study. Hepatobiliary: No focal liver abnormality is seen. No gallstones, gallbladder wall thickening, or biliary dilatation. Pancreas: Unremarkable. No pancreatic ductal dilatation or surrounding inflammatory changes. Spleen: Normal in size without focal abnormality. Adrenals/Urinary Tract: Bilateral adrenal nodularity is unchanged. The bladder is decompressed by Foley catheter. There is no hydronephrosis. Bilateral renal cysts are unchanged. Stomach/Bowel: There are mildly dilated small bowel loops in the central abdomen which has slightly decreased in size when compared to the prior study. The colon is nondilated as are distal ileal loops. There is sigmoid colon diverticulosis. The appendix is not seen.  Nasogastric tube tip is in the gastric fundus. The stomach is nondilated. There is no free air or  pneumatosis. Vascular/Lymphatic: Aortic atherosclerosis. No enlarged abdominal or pelvic lymph nodes. Reproductive: Prostate is unremarkable. Other: Heterogeneous collection is seen in the right lower quadrant measuring 12.2 x 7.1 x 5.3 cm, mildly increased in size. Percutaneous drainage catheter is seen in the center of this collection, unchanged in position. There is some surrounding inflammatory stranding, unchanged. Small amount of free fluid in the pelvis is similar to prior. There is mild body wall edema. And there is a fat containing ventral hernia just inferior to the xiphoid. Musculoskeletal: Degenerative changes affect the spine. IMPRESSION: 1. Mild increase in size of the right lower quadrant complex fluid collection. Percutaneous drainage catheter is unchanged in position. 2. Mildly dilated small bowel loops in the central abdomen have slightly decreased in size when compared to the prior study. Findings may represent ileus or partial small bowel obstruction. 3. Stable small bilateral pleural effusions with bibasilar atelectasis. 4. Stable small amount of free fluid in the pelvis. 5. Stable bilateral adrenal nodularity. 6. Aortic atherosclerosis. Aortic Atherosclerosis (ICD10-I70.0). Electronically Signed   By: Tyron Gallon M.D.   On: 09/13/2023 23:49        Feliciana Horn M.D. [facility] 09/14/2023, 5:36 PM  Available via Epic secure chat 7am-7pm After 7 pm, please refer to night coverage provider listed on amion.

## 2023-09-15 ENCOUNTER — Inpatient Hospital Stay (HOSPITAL_COMMUNITY)

## 2023-09-15 DIAGNOSIS — D62 Acute posthemorrhagic anemia: Secondary | ICD-10-CM | POA: Diagnosis not present

## 2023-09-15 DIAGNOSIS — N179 Acute kidney failure, unspecified: Secondary | ICD-10-CM | POA: Diagnosis not present

## 2023-09-15 DIAGNOSIS — I159 Secondary hypertension, unspecified: Secondary | ICD-10-CM | POA: Diagnosis not present

## 2023-09-15 DIAGNOSIS — K37 Unspecified appendicitis: Secondary | ICD-10-CM | POA: Diagnosis not present

## 2023-09-15 LAB — RENAL FUNCTION PANEL
Albumin: 1.8 g/dL — ABNORMAL LOW (ref 3.5–5.0)
Anion gap: 11 (ref 5–15)
BUN: 63 mg/dL — ABNORMAL HIGH (ref 8–23)
CO2: 26 mmol/L (ref 22–32)
Calcium: 8.7 mg/dL — ABNORMAL LOW (ref 8.9–10.3)
Chloride: 103 mmol/L (ref 98–111)
Creatinine, Ser: 3.54 mg/dL — ABNORMAL HIGH (ref 0.61–1.24)
GFR, Estimated: 17 mL/min — ABNORMAL LOW (ref >60)
Glucose, Bld: 204 mg/dL — ABNORMAL HIGH (ref 70–99)
Phosphorus: 3.4 mg/dL (ref 2.5–4.6)
Potassium: 3.9 mmol/L (ref 3.5–5.1)
Sodium: 140 mmol/L (ref 135–145)

## 2023-09-15 LAB — MAGNESIUM: Magnesium: 1.6 mg/dL — ABNORMAL LOW (ref 1.7–2.4)

## 2023-09-15 LAB — GLUCOSE, CAPILLARY
Glucose-Capillary: 166 mg/dL — ABNORMAL HIGH (ref 70–99)
Glucose-Capillary: 167 mg/dL — ABNORMAL HIGH (ref 70–99)
Glucose-Capillary: 170 mg/dL — ABNORMAL HIGH (ref 70–99)
Glucose-Capillary: 174 mg/dL — ABNORMAL HIGH (ref 70–99)
Glucose-Capillary: 205 mg/dL — ABNORMAL HIGH (ref 70–99)
Glucose-Capillary: 212 mg/dL — ABNORMAL HIGH (ref 70–99)

## 2023-09-15 MED ORDER — TECHNETIUM TO 99M ALBUMIN AGGREGATED
4.2000 | Freq: Once | INTRAVENOUS | Status: AC | PRN
  Administered 2023-09-15: 4.2 via INTRAVENOUS

## 2023-09-15 MED ORDER — MAGNESIUM FOR TPN
INJECTION | INTRAVENOUS | Status: AC
  Filled 2023-09-15: qty 772.8

## 2023-09-15 NOTE — Plan of Care (Signed)
  Problem: Coping: Goal: Ability to adjust to condition or change in health will improve Outcome: Progressing   Problem: Nutritional: Goal: Maintenance of adequate nutrition will improve Outcome: Progressing   Problem: Skin Integrity: Goal: Risk for impaired skin integrity will decrease Outcome: Progressing   Problem: Nutrition: Goal: Adequate nutrition will be maintained Outcome: Progressing   Problem: Activity: Goal: Risk for activity intolerance will decrease Outcome: Progressing   Problem: Coping: Goal: Level of anxiety will decrease Outcome: Progressing   Problem: Elimination: Goal: Will not experience complications related to bowel motility Outcome: Progressing Goal: Will not experience complications related to urinary retention Outcome: Progressing   Problem: Pain Managment: Goal: General experience of comfort will improve and/or be controlled Outcome: Progressing   Problem: Safety: Goal: Ability to remain free from injury will improve Outcome: Progressing   Problem: Skin Integrity: Goal: Risk for impaired skin integrity will decrease Outcome: Progressing

## 2023-09-15 NOTE — Progress Notes (Signed)
 Pt awoke @ 0345 screaming "help" stating he "couldn't breathe". Pt was having major anxiety and panic attack. Pt had slid down in his bed and was laying chin to chest. Pt pulled up in bed, repositioned and calmly assured while oxygen was placed via nasal cannula and brought up to 5L immediately. Once patient was sitting up and breathing through nasal cannula, pt continued to have severe anxiety and complained of CP upper bilaterally. Lungs clear to auscultation. Pt administered IV dilaudid  and EKG performed. EKG WNL. After a few minutes patient calmed down stating he could breathe much better upright in bed and no longer had CP. O2 brought down to [facility] after O2 reached 99% from initial 80% at the beginning of panic attack. Once calmed down, pt stated he was fine and went to sleep.

## 2023-09-15 NOTE — Progress Notes (Signed)
 [facility] NEPHROLOGY PROGRESS NOTE  Assessment/ Plan: Pt is a 78 y.o. yo male with CAD status post CABG, underwent laparoscopic appendectomy on 5/7 developed postop ileus, AKI on CKD.  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  UA with pyuria and kidney ultrasound without hydronephrosis. No sign of renal recovery with worsening azotemia. First HD on 5/14, second HD on 5/15.  Received third HD 5/17 with 1.5 L UF, tolerated well.  Plan for next HD on 5/20, TTS schedule. Renal navigator is aware of arranging outpatient HD as he gets closer to discharge.  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  On 5/14, the AV fistula is unable to cannulate even with small needle 17G.  S/p RIJ temp HD cath by IR on 5/14. VVS following for perm access and TDC conversion.  Will discuss with VVS today timing of TDC placement especially as his abdominal issues and infectious issues seem to be resolving.   # Appendicitis with possible abscess and postop ileus: s/p  antibiotics and drain per surgical team.  TPN for the nutrition  # Acute blood loss anemia, CT angio negative for bleeding.  Transfuse as needed.  # Metabolic acidosis: dc sodium bicarbonate , HD now.  # CKD-MBD, hyperphosphatemia: Phosphorus of 2.2 today, discontinue sevelamer , PTH level 337, continue to monitor.  # Hypokalemia: Managed with HD, monitor lab.   Subjective:  Seen and examined.   Dyspneic this morning with some panic, resolved NG tube clamped, patient states having bowel movements and passing flatus Dialysis yesterday with 2 L ultrafiltration  Objective Vital signs in last 24 hours: Vitals:   09/15/23 0600 09/15/23 0620 09/15/23 0824 09/15/23 0849  BP:    132/70  Pulse:  89    Resp:   19 17   Temp:    98.5 F (36.9 C)  TempSrc:    Axillary  SpO2: 100% 100%    Weight:      Height:       Weight change:   Intake/Output Summary (Last 24 hours) at 09/15/2023 1015 Last data filed at 09/15/2023 0600 Gross per 24 hour  Intake 2064.66 ml  Output 2720 ml  Net -655.34 ml     Labs: RENAL PANEL Recent Labs  Lab 09/09/23 0651 09/10/23 1049 09/10/23 1050 09/11/23 0546 09/12/23 0746 09/13/23 0559 09/14/23 0646  NA 139 139 139 138 137 140 144  K 3.4* 3.1* 3.1* 3.2* 3.4* 3.4* 3.8  CL 98 94* 94* 90* 95* 99 102  CO2 27 30 29 29 29 28 26   GLUCOSE 198* 196* 195* 239* 245* 209* 200*  BUN 90* 67* 67* 91* 53* 74* 91*  CREATININE 6.75* 5.28* 5.32* 5.86* 3.78* 4.59* 4.93*  CALCIUM  8.1* 8.4* 8.4* 8.4* 8.4* 8.5* 8.7*  MG 2.4 2.3  --  2.2 1.8 2.0  --   PHOS 6.9* 5.1* 5.1* 3.9 1.8* 2.2* 3.5  ALBUMIN  1.7*  --  1.7*  --   --  1.7*  --     Liver Function Tests: Recent Labs  Lab 09/09/23 0651 09/10/23 1050 09/13/23 0559  AST 29  --  70*  ALT 21  --  45*  ALKPHOS 65  --  83  BILITOT 1.0  --  1.7*  PROT 6.0*  --  5.9*  ALBUMIN   1.7* 1.7* 1.7*   No results for input(s): "LIPASE", "AMYLASE" in the last 168 hours.  No results for input(s): "AMMONIA" in the last 168 hours. CBC: Recent Labs    01/18/23 1118 04/06/23 0910 09/10/23 1049 09/11/23 0546 09/11/23 2140 09/13/23 0559 09/14/23 0646  HGB  --    < > 8.5* 8.4* 7.5* 8.7* 8.8*  MCV  --    < > 100.0 100.8* 101.3* 100.0 99.6  VITAMINB12 565  --   --   --   --   --   --    < > = values in this interval not displayed.    Cardiac Enzymes: No results for input(s): "CKTOTAL", "CKMB", "CKMBINDEX", "TROPONINI" in the last 168 hours. CBG: Recent Labs  Lab 09/14/23 1455 09/14/23 1959 09/14/23 2343 09/15/23 0358 09/15/23 0847  GLUCAP 210* 254* 221* 170* 205*    Iron Studies: No results for input(s): "IRON", "TIBC", "TRANSFERRIN", "FERRITIN" in the last 72 hours. Studies/Results: CT ABDOMEN PELVIS WO  CONTRAST Result Date: 09/13/2023 CLINICAL DATA:  Ongoing fever.  Abscess re-evaluation. EXAM: CT ABDOMEN AND PELVIS WITHOUT CONTRAST TECHNIQUE: Multidetector CT imaging of the abdomen and pelvis was performed following the standard protocol without IV contrast. RADIATION DOSE REDUCTION: This exam was performed according to the departmental dose-optimization program which includes automated exposure control, adjustment of the mA and/or kV according to patient size and/or use of iterative reconstruction technique. COMPARISON:  CT abdomen and pelvis 09/08/2023. FINDINGS: Lower chest: Small bilateral pleural effusions are again seen with atelectasis in the bilateral lower lobes similar to the prior study. Hepatobiliary: No focal liver abnormality is seen. No gallstones, gallbladder wall thickening, or biliary dilatation. Pancreas: Unremarkable. No pancreatic ductal dilatation or surrounding inflammatory changes. Spleen: Normal in size without focal abnormality. Adrenals/Urinary Tract: Bilateral adrenal nodularity is unchanged. The bladder is decompressed by Foley catheter. There is no hydronephrosis. Bilateral renal cysts are unchanged. Stomach/Bowel: There are mildly dilated small bowel loops in the central abdomen which has slightly decreased in size when compared to the prior study. The colon is nondilated as are distal ileal loops. There is sigmoid colon diverticulosis. The appendix is not seen. Nasogastric tube tip is in the gastric fundus. The stomach is nondilated. There is no free air or pneumatosis. Vascular/Lymphatic: Aortic atherosclerosis. No enlarged abdominal or pelvic lymph nodes. Reproductive: Prostate is unremarkable. Other: Heterogeneous collection is seen in the right lower quadrant measuring 12.2 x 7.1 x 5.3 cm, mildly increased in size. Percutaneous drainage catheter is seen in the center of this collection, unchanged in position. There is some surrounding inflammatory stranding, unchanged. Small  amount of free fluid in the pelvis is similar to prior. There is mild body wall edema. And there is a fat containing ventral hernia just inferior to the xiphoid. Musculoskeletal: Degenerative changes affect the spine. IMPRESSION: 1. Mild increase in size of the right lower quadrant complex fluid collection. Percutaneous drainage catheter is unchanged in position. 2. Mildly dilated small bowel loops in the central abdomen have slightly decreased in size when compared to the prior study. Findings may represent ileus or partial small bowel obstruction. 3. Stable small bilateral pleural effusions with bibasilar atelectasis. 4. Stable small amount of free fluid in the pelvis. 5. Stable bilateral adrenal nodularity. 6. Aortic atherosclerosis. Aortic Atherosclerosis (ICD10-I70.0). Electronically Signed   By: Tyron Gallon M.D.   On: 09/13/2023 23:49     Medications: Infusions:  TPN ADULT (ION) 70 mL/hr at 09/15/23 0347    Scheduled Medications:  Chlorhexidine  Gluconate  Cloth  6 each Topical Q0600   Chlorhexidine  Gluconate Cloth  6 each Topical Q0600   fluticasone  furoate-vilanterol  1 puff Inhalation Daily   heparin  injection (subcutaneous)  5,000 Units Subcutaneous Q8H   insulin  aspart  0-20 Units Subcutaneous Q4H   metoprolol  tartrate  10 mg Intravenous Q6H   sodium chloride  flush  3 mL Intravenous Q12H    have reviewed scheduled and prn medications.  Physical Exam: General:NAD, he has NG tube. Heart:RRR, s1s2 nl Lungs:clear b/l, no crackle Abdomen:soft, drain present Extremities:No edema Neurology: Alert, awake and following commands Vascular Access: AV fistula is quite deep, thrill positive. Temp HD cath.  Kellin Fifer B Takeira Yanes 09/15/2023,10:15 AM  LOS: 15 days

## 2023-09-15 NOTE — Progress Notes (Signed)
 Physical Therapy Progress Note Patient Details Name: Christian [name] MRN: [id number] DOB: 10-16-1945 Today's Date: 09/15/2023   History of Present Illness 78 y.o. male admitted 08/30/23 with RLQ pain, appendicitis and perforated appendix s/p laparoscopic appendectomy 5/6. Post op ileus. PMHx: CAD s/p CABG, HTN, CKD, DMT2, BPH, multiple sclerosis, neuropathy, mild dementia    PT Comments  Pt is making slow progress towards goals. Goals reviewed and stayed the same at this time. Pt currently 2 person Mod A for bed mobility, 2 person Min A for sit to stand and transfers. Pt has a high fear of falling which increases pt need for assist and has poor understanding of the importance of mobility despite heavy cues. Due to pt current functional status, home set up and available assistance at home recommending skilled physical therapy services < 3 hours/day in order to address strength, balance and functional mobility to decrease risk for falls, injury, immobility, skin break down and re-hospitalization.      If plan is discharge home, recommend the following: A lot of help with walking and/or transfers;Assistance with cooking/housework;Assist for transportation;Help with stairs or ramp for entrance;Supervision due to cognitive status   Can travel by private vehicle     No  Equipment Recommendations  Hospital bed;BSC/3in1;Wheelchair cushion (measurements PT);Wheelchair (measurements PT)       Precautions / Restrictions Precautions Precautions: Fall Recall of Precautions/Restrictions: Impaired Precaution/Restrictions Comments: abdominal incision, JP drain Restrictions Weight Bearing Restrictions Per Provider Order: No     Mobility  Bed Mobility Overal bed mobility: Needs Assistance Bed Mobility: Supine to Sit     Supine to sit: Mod assist, +2 for safety/equipment, +2 for physical assistance     General bed mobility comments: Attempted to get pt to roll with heavy education on the importance  of log roll in order to decrease stress on abdomen. Pt reports "that isn't happening." Pt was assisted at the upper back and utilized bil UE to help pull forward. Pt requires Mod A for scooting to EOB to get feet on the floor.    Transfers Overall transfer level: Needs assistance Equipment used: Rolling walker (2 wheels) Transfers: Sit to/from Stand, Bed to chair/wheelchair/BSC Sit to Stand: Min assist, +2 physical assistance, +2 safety/equipment   Step pivot transfers: Min assist, +2 safety/equipment, +2 physical assistance       General transfer comment: Pt was 2 person Min A for sit to stand 2x from EOB slightly elevated with RW. Pt then performed step pivot transfer from EOB to recliner at [facility] A with slight posterior lean, very short uneven steps withlow foot clearance and multi modal cues throughout. Pt sat before reaching behind him despite cues.    Ambulation/Gait     Pre-gait activities: Pt was able to take steps from EOB to recliner with low foot clearance, uneven stepping, use of RW; assist navigating RW for transfer and slight posterior lean.        Balance Overall balance assessment: Needs assistance Sitting-balance support: Feet supported Sitting balance-Leahy Scale: Fair Sitting balance - Comments: Pt close SBA for balance.   Standing balance support: Reliant on assistive device for balance, Bilateral upper extremity supported, During functional activity Standing balance-Leahy Scale: Poor Standing balance comment: reliant on UE Support        Communication Communication Communication: No apparent difficulties Factors Affecting Communication: Difficulty expressing self;Reduced clarity of speech  Cognition Arousal: Alert Behavior During Therapy: Anxious   PT - Cognitive impairments: Problem solving, Initiation, Safety/Judgement, No family/caregiver present to determine  baseline, Awareness Difficult to assess due to: Impaired communication Orientation  impairments: Situation     PT - Cognition Comments: slow to initiate and for processing, fearful. Pt has very poor insight into abilities and the importance of mobility. Stated over and over again from the beginning of the session "I'm going down." Following commands: Impaired Following commands impaired: Follows one step commands with increased time, Follows one step commands inconsistently    Cueing Cueing Techniques: Verbal cues, Gestural cues, Tactile cues     General Comments General comments (skin integrity, edema, etc.): HR up to 120's during session, pt with increased respirations in standing. Pt has a high fear of falling and anxiety about mobility overall. Pt was assisted with hygiene in standing and bed pad changed.      Pertinent Vitals/Pain Pain Assessment Pain Assessment: 0-10 Pain Score: 6  Pain Descriptors / Indicators: Discomfort, Grimacing, Constant Pain Intervention(s): Monitored during session, Limited activity within patient's tolerance     PT Goals (current goals can now be found in the care plan section) Acute Rehab PT Goals Patient Stated Goal: to have less pain when moving PT Goal Formulation: With patient/family Time For Goal Achievement: 09/29/23 Potential to Achieve Goals: Good Progress towards PT goals: Progressing toward goals    Frequency    Min 2X/week      PT Plan  Continue with current POC        AM-PAC PT "6 Clicks" Mobility   Outcome Measure  Help needed turning from your back to your side while in a flat bed without using bedrails?: A Lot Help needed moving from lying on your back to sitting on the side of a flat bed without using bedrails?: A Lot Help needed moving to and from a bed to a chair (including a wheelchair)?: A Lot Help needed standing up from a chair using your arms (e.g., wheelchair or bedside chair)?: A Lot Help needed to walk in hospital room?: Total Help needed climbing 3-5 steps with a railing? : Total 6 Click  Score: 10    End of Session   Activity Tolerance: Patient tolerated treatment well;Other (comment) (anxiety/fear of falling) Patient left: with call bell/phone within reach;in chair;with chair alarm set Nurse Communication: Mobility status PT Visit Diagnosis: Other abnormalities of gait and mobility (R26.89);Muscle weakness (generalized) (M62.81);Pain     Time: 9604-5409 PT Time Calculation (min) (ACUTE ONLY): 28 min  Charges:    $Therapeutic Activity: 8-22 mins PT General Charges $$ ACUTE PT VISIT: 1 Visit                     Sloan Duncans, DPT, CLT  Acute Rehabilitation Services Office: [phone] (Secure chat preferred)    Jenice Mitts 09/15/2023, 10:23 AM

## 2023-09-15 NOTE — Progress Notes (Signed)
 I was contacted by nephrology about placement of tunneled dialysis catheter.  Patient is a 78 year old male with ESRD now status post laparoscopic appendectomy for perforated appendicitis requiring JP drain.  Currently has a right IJ temp cath placed by IR.  Will tentatively plan for tunneled dialysis catheter placement tomorrow in the OR.  Discussed n.p.o. after midnight.  Will follow to make sure he has no other active infectious concerns including rising leukocytosis or fevers.  I have discussed with Dr. Rosalva Comber who has been following him peripherally and plan is to deal with new dialysis access as an outpatient once his other hospital issues have resolved.  Christian Hensen, MD [facility] Office: [phone]   Christian [name]

## 2023-09-15 NOTE — Progress Notes (Addendum)
 General Surgery Follow Up Note  Subjective:    Overnight Issues:  Reports some SOB overnight that is now improved but not resolved. Denies abdominal pain, nausea, or vomiting. Reports flatus and ongoing BMs. Patient thinks his NG tube was clamped all night.   Oriented to person, Christian [name], 2025, and abdominal surgery  Objective:  Vital signs for last 24 hours: Temp:  [98.3 F (36.8 C)-98.7 F (37.1 C)] 98.5 F (36.9 C) (05/21 0849) Pulse Rate:  [89-125] 89 (05/21 0620) Resp:  [17-31] 17 (05/21 0824) BP: (105-154)/(51-86) 132/70 (05/21 0849) SpO2:  [80 %-100 %] 100 % (05/21 0620) Weight:  [85.1 kg-92 kg] 85.1 kg (05/20 1258)  Hemodynamic parameters for last 24 hours:    Intake/Output from previous day: 05/20 0701 - 05/21 0700 In: 2264.7 [P.O.:620; I.V.:1444.7; IV Piggyback:200] Out: 2845 [Urine:675; Emesis/NG output:150; Drains:20]  Intake/Output this shift: No intake/output data recorded.  Vent settings for last 24 hours:    Physical Exam:  Gen: laying flat. Tachypneic with some belly breathing but O2 sats 100% on 2L [place] Neuro: follows commands, alert, communicative HEENT: PERRL Neck: supple CV: RRR Pulm: unlabored breathing on RA Abd: soft, NT, incision clean, dry, intact, JP with old blood  NG clamped w/ 100 mL dark bile in cannister. Placed to intermittent suction by me. GU: HD, foley, 675 mL Extr: wwp, no edema  Results for orders placed or performed during the hospital encounter of 08/30/23 (from the past 24 hours)  Glucose, capillary     Status: Abnormal   Collection Time: 09/14/23  2:55 PM  Result Value Ref Range   Glucose-Capillary 210 (H) 70 - 99 mg/dL   Comment 1 Notify RN    Comment 2 Document in Chart   Glucose, capillary     Status: Abnormal   Collection Time: 09/14/23  7:59 PM  Result Value Ref Range   Glucose-Capillary 254 (H) 70 - 99 mg/dL   Comment 1 Notify RN   Glucose, capillary     Status: Abnormal   Collection Time: 09/14/23 11:43 PM   Result Value Ref Range   Glucose-Capillary 221 (H) 70 - 99 mg/dL   Comment 1 Notify RN   Glucose, capillary     Status: Abnormal   Collection Time: 09/15/23  3:58 AM  Result Value Ref Range   Glucose-Capillary 170 (H) 70 - 99 mg/dL  Glucose, capillary     Status: Abnormal   Collection Time: 09/15/23  8:47 AM  Result Value Ref Range   Glucose-Capillary 205 (H) 70 - 99 mg/dL   Comment 1 Notify RN     Assessment & Plan: The plan of care was discussed with the bedside dialysis nurse for the day, who is in agreement with this plan and no additional concerns were raised.   Present on Admission:  Appendicitis  HTN (hypertension)  Ischemic cardiomyopathy  Acute renal failure superimposed on stage 4 chronic kidney disease (HCC)  CAD (coronary artery disease)  (Resolved) Acute appendicitis  ABLA (acute blood loss anemia)    LOS: 15 days   Additional comments:I reviewed the patient's new clinical lab test results.   and I reviewed the patients new imaging test results.    POD 14 - s/p lap appy with JP drain placement for perforated appendicitis [name] 5/6  - started having bowel function over weekend, NG output lower in the last 24h. If residuals are <250 mL this AM I will remove NG and advance to full liquids. - CT abd pelvis  5/14 shows JP within a hematoma, post-op ileus with dilated small bowel, bilateral R>L pleural effusions.  - CT scan repeated 5/20 by ID shows slight increase in size of RLQ hematoma and interval decrease in small bowel dilation.  - afebrile this morning, blood Cx NGTD, recommend stopping abx and following clinically for signs of infection. - dark sanguinous output from JP drain - low output, remove today - encourage OOB, ambulation - HD today     FEN - CLD, TPN started 5/14 Lines/tubes - temp HD cath R internal jugular, PIV x2 RUE, LUE w/ AV Fistula (non-functional), NGT , LLQ JP drain Foley - in placed, clear yellow urine VTE - heparin /plavix  on  hold ID - Zosyn  d/c 5/19; WBC stable.    Michial Akin, PA-C [facility] Please see Amion for pager number during day hours 7:00am-4:30pm       09/15/2023  *Care during the described time interval was provided by me. I have reviewed this patient's available data, including medical history, events of note, physical examination and test results as part of my evaluation.

## 2023-09-15 NOTE — Progress Notes (Addendum)
 [facility]                                                                               Christian [name], is a 78 y.o. male, DOB - 09/22/1945, WUJ:[id number] Admit date - 08/30/2023    Outpatient Primary MD for the patient is [name], MD  LOS - 15  days    Brief summary    78 y.o. yo male with CAD status post CABG, postoperative CVA and renal failure , underwent laparoscopic appendectomy for perforated appendicitis by Dr Aniceto Barley on 5/6 developed postop ileus, s/p JP drain and has a NG tube on suction,  AKI on CKD stage 4, currently on HD. Patient had a old AV fistula , was unable to cannulate , underwent RIJ temp HD cath by IR on 5/14.  Recent CT abd and pelvis on 5/14 shows JP drain with a hematoma, with persistent post op ileus with dilated small bowel.  Patient reports being nauseated NGT connected to suction.  Overnight patient became   Assessment & Plan    Assessment and Plan:  Appendicitis with possible periappendiceal abscess Post op ileus - s/p lap appendectomy on 5/6 by Dr Aniceto Barley - completed 13 days of IV Zosyn  . Blood cultures repeated and negative so far.  -  JP drain remains in place with  minimal dark sanguinous output .  - Poor oral intake and ileus slowly improving. Surgery recommending TPN which was started on 5/14. - NGT output improving. Further management deferred to General surgery.  - patient having bowel movements. Encourage out of bed and work with PT.  - Repeat CT abdomen and pelvis on 5/14 shows complex 5.5 by 11.7 by 4.3 cm collection adjacent to the appendectomy site. - ID consulted for persistent leukocytosis and fever ( low grade on 5/19) despite IV antibiotics. A rpt CT abd and pelvis without contrast showed large  right lower quadrant complex fluid collection. General surgery suggests the complex fluid collection is probably a hematoma rather than abscess. The JP drain is not draining much, will probably come out in the next  few days.  - ID recommends to hold off on IV antibiotics and observe.     Acute on CKDIV ATN - patient has history of CKD4. Baseline creat ~ 3.2 - 3.5, eGFR~ 17 - he has required HD the past - possibly  from volume depletion from surgery/blood loss and when he had severe hypoTN on 5/7; - Vascular surgery consulted; appreciate assistance.  Fistulogram performed 09/07/2023 and balloon venoplasty performed and left arm fistula ready for use.   Unfortunately left arm fistula cannot be cannulated at this time. Vascular surgery consulted, recommended to hold off on the temporary HD line for now.  - underwent RIJ temp HD cath by IR on 5/14.  - undergoing HD as per nephrology.  - no signs of renal recovery so far.  - further management as per nephrology.     ABLA Hemoglobin  dropped from 10 TO 8.5 to 8.4 to 7.5.  Hemoglobin improved to 8.7 post transfusion of 1 unit. Hemoglobin is stable around 8.8.  Patient has dark sanguinous output from JP drain.  HTN Well controlled.  Currently on metoprolol  5 mg every 6 hours, increase it to 10 mg every 6 hours.  Added hydralazine  10 mg every 8 hours prn.    Type 2 DM; well controlled CBG'S CBG (last 3)  Recent Labs    09/14/23 2343 09/15/23 0358 09/15/23 0847  GLUCAP 221* 170* 205*   Resume SSI. Insulin  in the tpn, managed by pharmacy.    CAD - s/p CABG - cleared by cardiology prior to surgery  Thrombocytosis Monitor.     Hypokalemia and hypomagnesemia Replaced.   Dyspnea last night, suspect panic attack.  Get CXR . EKG shows sinus tachycardia.  Patient currently requiring 2 to 3 lit of NCo xygen.  Recommend IS. Ambulate , get OOB.  Working with PT today.  Get V/Q scan to rule out PE.   Malnutrition Type:  Nutrition Problem: Inadequate oral intake Etiology: inability to eat   Malnutrition Characteristics:  Signs/Symptoms: NPO status   Nutrition Interventions:  Interventions: TPN  Estimated body mass index is  27.71 kg/m as calculated from the following:   Height as of this encounter: 5\' 9"  (1.753 m).   Weight as of this encounter: 85.1 kg.  Code Status: full code.  DVT Prophylaxis:  heparin  injection 5,000 Units Start: 09/09/23 1400 Place and maintain sequential compression device Start: 09/01/23 2001 SCDs Start: 08/31/23 0415   Level of Care: Level of care: Progressive Family Communication: None at bedside.   Disposition Plan:     Remains inpatient appropriate:  Pending clinical improvement.    Procedures:  Lap appendectomy wit JP drain placement for perforated appendicitis by Dr Aniceto Barley on 5/6 NGT placement.  TPN   Consultants:   General surgery Nephrology Vascular surgery.  IR.  ID   Antimicrobials:   Anti-infectives (From admission, onward)    Start     Dose/Rate Route Frequency Ordered Stop   09/13/23 2000  ampicillin -sulbactam (UNASYN ) 1.5 g in sodium chloride  0.9 % 100 mL IVPB  Status:  Discontinued        1.5 g 200 mL/hr over 30 Minutes Intravenous Every 12 hours 09/13/23 1817 09/15/23 0943   08/31/23 2200  piperacillin -tazobactam (ZOSYN ) IVPB 2.25 g  Status:  Discontinued        2.25 g 100 mL/hr over 30 Minutes Intravenous Every 8 hours 08/31/23 1953 09/13/23 0858   08/31/23 0600  piperacillin -tazobactam (ZOSYN ) IVPB 3.375 g  Status:  Discontinued        3.375 g 100 mL/hr over 30 Minutes Intravenous Every 8 hours 08/31/23 0532 08/31/23 1953   08/30/23 2245  piperacillin -tazobactam (ZOSYN ) IVPB 3.375 g        3.375 g 100 mL/hr over 30 Minutes Intravenous  Once 08/30/23 2233 08/31/23 0005        Medications  Scheduled Meds:  Chlorhexidine  Gluconate Cloth  6 each Topical Q0600   Chlorhexidine  Gluconate Cloth  6 each Topical Q0600   fluticasone  furoate-vilanterol  1 puff Inhalation Daily   heparin  injection (subcutaneous)  5,000 Units Subcutaneous Q8H   insulin  aspart  0-20 Units Subcutaneous Q4H   metoprolol  tartrate  10 mg Intravenous Q6H   sodium chloride   flush  3 mL Intravenous Q12H   Continuous Infusions:  TPN ADULT (ION) 70 mL/hr at 09/15/23 0347   PRN Meds:.albuterol , dextrose , hydrALAZINE , HYDROmorphone  (DILAUDID ) injection, LORazepam , methocarbamol  (ROBAXIN ) injection, metoprolol  tartrate, naLOXone  (NARCAN )  injection, ondansetron  (ZOFRAN ) IV, simethicone , sodium chloride  flush, traMADol     Subjective:   Christian [name] was seen and examined today.reports feeling sob  this morning.     Objective:   Vitals:   09/15/23 0600 09/15/23 0620 09/15/23 0824 09/15/23 0849  BP:    132/70  Pulse:  89    Resp:  19 17   Temp:    98.5 F (36.9 C)  TempSrc:    Axillary  SpO2: 100% 100%    Weight:      Height:        Intake/Output Summary (Last 24 hours) at 09/15/2023 0959 Last data filed at 09/15/2023 0600 Gross per 24 hour  Intake 2064.66 ml  Output 2720 ml  Net -655.34 ml   Filed Weights   09/14/23 0849 09/14/23 1030 09/14/23 1258  Weight: 98.2 kg 92 kg 85.1 kg     Exam  General exam: Ill appearing elderly gentleman, on 2 lit of  oxygen. NGT in place  Respiratory system: diminished air entry at bases. No wheezing or rhonchi.  Cardiovascular system: S1 & S2 heard, RRR. No JVD,  Gastrointestinal system: Abdomen is soft , distended. Bs+ , JP drain in place.  Central nervous system: Alert and oriented.  Extremities: no pedal edema.  Skin: No rashes, Psychiatry:  anxious.        Data Reviewed:  I have personally reviewed following labs and imaging studies   CBC Lab Results  Component Value Date   WBC 13.2 (H) 09/14/2023   RBC 2.74 (L) 09/14/2023   HGB 8.8 (L) 09/14/2023   HCT 27.3 (L) 09/14/2023   MCV 99.6 09/14/2023   MCH 32.1 09/14/2023   PLT 371 09/14/2023   MCHC 32.2 09/14/2023   RDW 17.1 (H) 09/14/2023   LYMPHSABS 0.9 09/13/2023   MONOABS 1.1 (H) 09/13/2023   EOSABS 0.1 09/13/2023   BASOSABS 0.1 09/13/2023     Last metabolic panel Lab Results  Component Value Date   NA 144 09/14/2023   K 3.8  09/14/2023   CL 102 09/14/2023   CO2 26 09/14/2023   BUN 91 (H) 09/14/2023   CREATININE 4.93 (H) 09/14/2023   GLUCOSE 200 (H) 09/14/2023   GFRNONAA 11 (L) 09/14/2023   GFRAA 19 (L) 10/18/2019   CALCIUM  8.7 (L) 09/14/2023   PHOS 3.5 09/14/2023   PROT 5.9 (L) 09/13/2023   ALBUMIN  1.7 (L) 09/13/2023   LABGLOB 2.4 05/22/2021   AGRATIO 1.9 05/22/2021   BILITOT 1.7 (H) 09/13/2023   ALKPHOS 83 09/13/2023   AST 70 (H) 09/13/2023   ALT 45 (H) 09/13/2023   ANIONGAP 16 (H) 09/14/2023    CBG (last 3)  Recent Labs    09/14/23 2343 09/15/23 0358 09/15/23 0847  GLUCAP 221* 170* 205*      Coagulation Profile: No results for input(s): "INR", "PROTIME" in the last 168 hours.   Radiology Studies: CT ABDOMEN PELVIS WO CONTRAST Result Date: 09/13/2023 CLINICAL DATA:  Ongoing fever.  Abscess re-evaluation. EXAM: CT ABDOMEN AND PELVIS WITHOUT CONTRAST TECHNIQUE: Multidetector CT imaging of the abdomen and pelvis was performed following the standard protocol without IV contrast. RADIATION DOSE REDUCTION: This exam was performed according to the departmental dose-optimization program which includes automated exposure control, adjustment of the mA and/or kV according to patient size and/or use of iterative reconstruction technique. COMPARISON:  CT abdomen and pelvis 09/08/2023. FINDINGS: Lower chest: Small bilateral pleural effusions are again seen with atelectasis in the bilateral lower lobes similar to the prior study. Hepatobiliary: No focal liver abnormality is seen. No gallstones, gallbladder wall thickening, or biliary dilatation. Pancreas: Unremarkable. No pancreatic ductal dilatation or surrounding inflammatory changes. Spleen:  Normal in size without focal abnormality. Adrenals/Urinary Tract: Bilateral adrenal nodularity is unchanged. The bladder is decompressed by Foley catheter. There is no hydronephrosis. Bilateral renal cysts are unchanged. Stomach/Bowel: There are mildly dilated small bowel  loops in the central abdomen which has slightly decreased in size when compared to the prior study. The colon is nondilated as are distal ileal loops. There is sigmoid colon diverticulosis. The appendix is not seen. Nasogastric tube tip is in the gastric fundus. The stomach is nondilated. There is no free air or pneumatosis. Vascular/Lymphatic: Aortic atherosclerosis. No enlarged abdominal or pelvic lymph nodes. Reproductive: Prostate is unremarkable. Other: Heterogeneous collection is seen in the right lower quadrant measuring 12.2 x 7.1 x 5.3 cm, mildly increased in size. Percutaneous drainage catheter is seen in the center of this collection, unchanged in position. There is some surrounding inflammatory stranding, unchanged. Small amount of free fluid in the pelvis is similar to prior. There is mild body wall edema. And there is a fat containing ventral hernia just inferior to the xiphoid. Musculoskeletal: Degenerative changes affect the spine. IMPRESSION: 1. Mild increase in size of the right lower quadrant complex fluid collection. Percutaneous drainage catheter is unchanged in position. 2. Mildly dilated small bowel loops in the central abdomen have slightly decreased in size when compared to the prior study. Findings may represent ileus or partial small bowel obstruction. 3. Stable small bilateral pleural effusions with bibasilar atelectasis. 4. Stable small amount of free fluid in the pelvis. 5. Stable bilateral adrenal nodularity. 6. Aortic atherosclerosis. Aortic Atherosclerosis (ICD10-I70.0). Electronically Signed   By: Tyron Gallon M.D.   On: 09/13/2023 23:49        Feliciana Horn M.D. [facility] 09/15/2023, 9:59 AM  Available via Epic secure chat 7am-7pm After 7 pm, please refer to night coverage provider listed on amion.

## 2023-09-15 NOTE — Progress Notes (Signed)
 Occupational Therapy Treatment Patient Details Name: Christian [name] MRN: [id number] DOB: 12-Aug-1945 Today's Date: 09/15/2023   History of present illness 78 y.o. male admitted 08/30/23 with RLQ pain, appendicitis and perforated appendix s/p laparoscopic appendectomy 5/6. Post op ileus. PMHx: CAD s/p CABG, HTN, CKD, DMT2, BPH, multiple sclerosis, neuropathy, mild dementia   OT comments  Patient demonstrating progress with transfers this session. Patient instructed in log rolling but declined and required mod assist +2 to get to EOB. Patient performed sit to stand from EOB to RW with min assist +2 and was able to stand for peri area bottom cleaning and pad change. Patient agreed to transfer to recliner with min assist +2 and cues for hand placement and safety. Patient will benefit from continued inpatient follow up therapy, <3 hours/day. Acute OT to continue to follow to address established goals to facilitate DC to next venue of care.        If plan is discharge home, recommend the following:  Two people to help with walking and/or transfers;Two people to help with bathing/dressing/bathroom;Assistance with cooking/housework;Direct supervision/assist for medications management;Direct supervision/assist for financial management;Assist for transportation;Help with stairs or ramp for entrance   Equipment Recommendations  Other (comment) (TBD)    Recommendations for Other Services      Precautions / Restrictions Precautions Precautions: Fall Recall of Precautions/Restrictions: Impaired Precaution/Restrictions Comments: abdominal incision, JP drain Restrictions Weight Bearing Restrictions Per Provider Order: No       Mobility Bed Mobility Overal bed mobility: Needs Assistance Bed Mobility: Supine to Sit     Supine to sit: Mod assist, +2 for safety/equipment, +2 for physical assistance     General bed mobility comments: required mod assist +2 due to patient not willing to attempt  rolling and required assistance with trunk and BLEs to get to EOB without causing stress to abdomen    Transfers Overall transfer level: Needs assistance Equipment used: Rolling walker (2 wheels) Transfers: Sit to/from Stand, Bed to chair/wheelchair/BSC Sit to Stand: Min assist, +2 physical assistance, +2 safety/equipment     Step pivot transfers: Min assist, +2 safety/equipment, +2 physical assistance     General transfer comment: 2 person min assist for sit to stand from EOB and cues throughout for step pivot transfer to recliner. Cues for hand placement with poor carryover     Balance Overall balance assessment: Needs assistance Sitting-balance support: Feet supported Sitting balance-Leahy Scale: Fair Sitting balance - Comments: Pt close SBA for balance.   Standing balance support: Reliant on assistive device for balance, Bilateral upper extremity supported, During functional activity Standing balance-Leahy Scale: Poor Standing balance comment: reliant on UE Support                           ADL either performed or assessed with clinical judgement   ADL Overall ADL's : Needs assistance/impaired     Grooming: Sitting;Contact guard assist Grooming Details (indicate cue type and reason): on EOB     Lower Body Bathing: Maximal assistance;Sit to/from stand Lower Body Bathing Details (indicate cue type and reason): stood at EOB with max assist for peri area bottom cleaning                            Extremity/Trunk Assessment Upper Extremity Assessment Upper Extremity Assessment: Overall WFL for tasks assessed            Vision  Perception     Praxis     Communication Communication Communication: No apparent difficulties Factors Affecting Communication: Difficulty expressing self;Reduced clarity of speech   Cognition Arousal: Alert Behavior During Therapy: Anxious Cognition: Difficult to assess             OT - Cognition  Comments: required cues for encouragement                 Following commands: Impaired Following commands impaired: Follows one step commands with increased time, Follows one step commands inconsistently      Cueing   Cueing Techniques: Verbal cues, Gestural cues, Tactile cues  Exercises      Shoulder Instructions       General Comments HR increased to 120's, patient demonstrates fear of falling when standing and during transfer    Pertinent Vitals/ Pain       Pain Assessment Pain Assessment: 0-10 Pain Score: 6  Pain Location: R abdomen Pain Descriptors / Indicators: Discomfort, Grimacing, Constant Pain Intervention(s): Limited activity within patient's tolerance, Monitored during session, Repositioned  Home Living                                          Prior Functioning/Environment              Frequency  Min 2X/week        Progress Toward Goals  OT Goals(current goals can now be found in the care plan section)  Progress towards OT goals: Progressing toward goals  Acute Rehab OT Goals Patient Stated Goal: none stated OT Goal Formulation: With patient Time For Goal Achievement: 09/18/23 Potential to Achieve Goals: Fair ADL Goals Pt Will Perform Upper Body Bathing: with modified independence;sitting Pt Will Perform Lower Body Bathing: with min assist;sitting/lateral leans Pt Will Transfer to Toilet: with min assist;ambulating Additional ADL Goal #1: Pt will be able to sit at EOB for 5 mins to prepare for OOB activity.  Plan      Co-evaluation    PT/OT/SLP Co-Evaluation/Treatment: Yes Reason for Co-Treatment: For patient/therapist safety;To address functional/ADL transfers   OT goals addressed during session: ADL's and self-care      AM-PAC OT "6 Clicks" Daily Activity     Outcome Measure   Help from another person eating meals?: A Little Help from another person taking care of personal grooming?: A Little Help from  another person toileting, which includes using toliet, bedpan, or urinal?: A Lot Help from another person bathing (including washing, rinsing, drying)?: A Lot Help from another person to put on and taking off regular upper body clothing?: A Little Help from another person to put on and taking off regular lower body clothing?: A Lot 6 Click Score: 15    End of Session Equipment Utilized During Treatment: Rolling walker (2 wheels)  OT Visit Diagnosis: Unsteadiness on feet (R26.81);Other abnormalities of gait and mobility (R26.89);Repeated falls (R29.6);Muscle weakness (generalized) (M62.81);Pain Pain - part of body:  (abdomin)   Activity Tolerance Patient tolerated treatment well   Patient Left in chair;with call bell/phone within reach;with chair alarm set   Nurse Communication Mobility status;Need for lift equipment        Time: 8657-8469 OT Time Calculation (min): 26 min  Charges: OT General Charges $OT Visit: 1 Visit OT Treatments $Self Care/Home Management : 8-22 mins  Anitra Barn, OTA Acute Rehabilitation Services  Office [phone]   Mattheo Swindle L  Alva Jewels 09/15/2023, 12:33 PM

## 2023-09-15 NOTE — Progress Notes (Signed)
 PHARMACY - TOTAL PARENTERAL NUTRITION CONSULT NOTE   Indication: Prolonged ileus  Patient Measurements: Height: 5\' 9"  (175.3 cm) Weight: 85.1 kg (187 lb 9.8 oz) IBW/kg (Calculated) : 70.7 TPN AdjBW (KG): 78.4 Body mass index is 27.71 kg/m. Usual Weight: 206-215lb per chart review and wife  Assessment:  78 yo M with acute appendicitis s/p appendectomy with persistent ileus. 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 Patient with little to no intake since admission (9 days) due to procedures and bloody drain output. Patient may be a little confused right now per RN. Spoke with Wife who states patient eats a variety of foods (vegetable soup with beef, hamburgers, fries etc) 5-6 times a day at home. Last good meal was 5/3 prior to admission. Pharmacy consulted for TPN.   Glucose / Insulin : CBGs 170-254, A1C 7.3%, used 26 units SSI/24hr and 55 units in TPN Home med- glimepiride  Electrolytes: K 3.9, Phos 3.4, Mg 1.6, CoCa 10.5 (none in TPN), others wnl Renal: New HD - last 5/20. Scr 3.54, BUN 63.  Hepatic: wnl, albumin  1.8, TG 228 Intake / Output; MIVF: UOP 675 mL (0.3 mL/k/h), NGT 150 ml > clamp trials, drain 20 mL, LBM 5/19 GI Imaging: 5/5 CT: acute appendicitis with possible small abscess  5/8 CT: drain in place, mild SB distension likely mild ileus  5/9 KUB: increased gas in SB and colon, likely adynamic ileus or early pSBO 5/11 KUB: Gas distended small bowel throughout the abdomen, unchanged.  5/12 KUB: Diffuse gaseous distention of bowel loops in the abdomen, SBO vs ileus  5/13 KUB:  Mild diffuse gaseous dilatation of large and small bowel without significant change 5/14 CT: Scattered loops of mildly dilated small bowel, possibly postop ileus  5/14 KUB: Multiple dilated loops of small bowel.  5/16 KUB: Diffuse gaseous SB dilatation within the visualized upper abdomen 5/20 CT ab/p: ileus vs partial small bowel obstruction. Slight decrease in dilated bowel loops size.  GI  Surgeries / Procedures:  5/6 laparoscopic appendectomy with JP drain 5/12 NGT 5/14 replace NGT 5/15 replace NGT  Central access: triple lumen HD cath 5/14 TPN start date: 5/14   Nutritional Goals: Goal Concentrated TPN rate is 70 mL/hr (provides 116 g of protein and 2192 kcals per day)  RD Assessment: Estimated Needs Total Energy Estimated Needs: 2150-2350 Total Protein Estimated Needs: 115-135 grams Total Fluid Estimated Needs: UOP + 1L  Current Nutrition:  NPO+ TPN 5/17 CLD 5/21 FLD  Plan:  Continue concentrated TPN at goal 70 mL/hr at 1800, meeting 100% of estimated needs Electrolytes in TPN: Na 75 mEq/L, K 30 mEq/L, Ca 0 mEq/L, Mg 5 mEq/L, Phos 8 mmol/L. Cl:Ac 1:1.  Add standard MVI and trace elements to TPN Continue Resistant q4h SSI and adjust as needed   Increase insulin  65 units to TPN  Monitor TPN labs daily until stable at goal then on Mon/Thurs If residuals are <250 mL this AM Surgery will remove NG and advance to full liquids  Thank you for involving pharmacy in this patient's care.  Caroline Cinnamon, PharmD, BCPS Clinical Pharmacist Clinical phone for 09/15/2023 is [phone] 09/15/2023 7:10 AM

## 2023-09-16 ENCOUNTER — Encounter (HOSPITAL_COMMUNITY): Payer: Self-pay | Admitting: Internal Medicine

## 2023-09-16 ENCOUNTER — Inpatient Hospital Stay (HOSPITAL_COMMUNITY)

## 2023-09-16 ENCOUNTER — Other Ambulatory Visit: Payer: Self-pay

## 2023-09-16 ENCOUNTER — Encounter (HOSPITAL_COMMUNITY)
Admission: EM | Disposition: A | Discharge status: Skilled Nursing Facility | Payer: Self-pay | Source: Home / Self Care | Attending: Internal Medicine

## 2023-09-16 ENCOUNTER — Inpatient Hospital Stay (HOSPITAL_COMMUNITY): Admitting: Anesthesiology

## 2023-09-16 DIAGNOSIS — K37 Unspecified appendicitis: Secondary | ICD-10-CM | POA: Diagnosis not present

## 2023-09-16 DIAGNOSIS — E1122 Type 2 diabetes mellitus with diabetic chronic kidney disease: Secondary | ICD-10-CM

## 2023-09-16 DIAGNOSIS — Z992 Dependence on renal dialysis: Secondary | ICD-10-CM

## 2023-09-16 DIAGNOSIS — Z95828 Presence of other vascular implants and grafts: Secondary | ICD-10-CM

## 2023-09-16 DIAGNOSIS — I120 Hypertensive chronic kidney disease with stage 5 chronic kidney disease or end stage renal disease: Secondary | ICD-10-CM | POA: Diagnosis not present

## 2023-09-16 DIAGNOSIS — N186 End stage renal disease: Secondary | ICD-10-CM | POA: Diagnosis not present

## 2023-09-16 DIAGNOSIS — D62 Acute posthemorrhagic anemia: Secondary | ICD-10-CM | POA: Diagnosis not present

## 2023-09-16 DIAGNOSIS — N184 Chronic kidney disease, stage 4 (severe): Secondary | ICD-10-CM | POA: Diagnosis not present

## 2023-09-16 DIAGNOSIS — N179 Acute kidney failure, unspecified: Secondary | ICD-10-CM | POA: Diagnosis not present

## 2023-09-16 HISTORY — PX: ULTRASOUND GUIDANCE FOR VASCULAR ACCESS: SHX6516

## 2023-09-16 HISTORY — PX: INSERTION OF DIALYSIS CATHETER: SHX1324

## 2023-09-16 HISTORY — PX: REMOVAL OF A HERO DEVICE: SHX6054

## 2023-09-16 LAB — GLUCOSE, CAPILLARY
Glucose-Capillary: 163 mg/dL — ABNORMAL HIGH (ref 70–99)
Glucose-Capillary: 152 mg/dL — ABNORMAL HIGH (ref 70–99)
Glucose-Capillary: 167 mg/dL — ABNORMAL HIGH (ref 70–99)
Glucose-Capillary: 156 mg/dL — ABNORMAL HIGH (ref 70–99)
Glucose-Capillary: 163 mg/dL — ABNORMAL HIGH (ref 70–99)
Glucose-Capillary: 176 mg/dL — ABNORMAL HIGH (ref 70–99)

## 2023-09-16 LAB — COMPREHENSIVE METABOLIC PANEL WITH GFR
ALT: 105 U/L — ABNORMAL HIGH (ref 0–44)
AST: 102 U/L — ABNORMAL HIGH (ref 15–41)
Albumin: 1.8 g/dL — ABNORMAL LOW (ref 3.5–5.0)
Alkaline Phosphatase: 97 U/L (ref 38–126)
Anion gap: 15 (ref 5–15)
BUN: 83 mg/dL — ABNORMAL HIGH (ref 8–23)
CO2: 23 mmol/L (ref 22–32)
Calcium: 9.1 mg/dL (ref 8.9–10.3)
Chloride: 104 mmol/L (ref 98–111)
Creatinine, Ser: 4.04 mg/dL — ABNORMAL HIGH (ref 0.61–1.24)
GFR, Estimated: 15 mL/min — ABNORMAL LOW (ref >60)
Glucose, Bld: 167 mg/dL — ABNORMAL HIGH (ref 70–99)
Potassium: 3.9 mmol/L (ref 3.5–5.1)
Sodium: 142 mmol/L (ref 135–145)
Total Bilirubin: 0.8 mg/dL (ref 0.0–1.2)
Total Protein: 6.3 g/dL — ABNORMAL LOW (ref 6.5–8.1)

## 2023-09-16 LAB — BASIC METABOLIC PANEL WITH GFR
Anion gap: 11 (ref 5–15)
BUN: 93 mg/dL — ABNORMAL HIGH (ref 8–23)
CO2: 23 mmol/L (ref 22–32)
Calcium: 9.1 mg/dL (ref 8.9–10.3)
Chloride: 108 mmol/L (ref 98–111)
Creatinine, Ser: 4.28 mg/dL — ABNORMAL HIGH (ref 0.61–1.24)
GFR, Estimated: 14 mL/min — ABNORMAL LOW (ref >60)
Glucose, Bld: 180 mg/dL — ABNORMAL HIGH (ref 70–99)
Potassium: 4.5 mmol/L (ref 3.5–5.1)
Sodium: 142 mmol/L (ref 135–145)

## 2023-09-16 LAB — RENAL FUNCTION PANEL
Albumin: 2.1 g/dL — ABNORMAL LOW (ref 3.5–5.0)
Anion gap: 14 (ref 5–15)
BUN: 90 mg/dL — ABNORMAL HIGH (ref 8–23)
CO2: 23 mmol/L (ref 22–32)
Calcium: 9 mg/dL (ref 8.9–10.3)
Chloride: 107 mmol/L (ref 98–111)
Creatinine, Ser: 4.3 mg/dL — ABNORMAL HIGH (ref 0.61–1.24)
GFR, Estimated: 14 mL/min — ABNORMAL LOW (ref >60)
Glucose, Bld: 171 mg/dL — ABNORMAL HIGH (ref 70–99)
Phosphorus: 4.7 mg/dL — ABNORMAL HIGH (ref 2.5–4.6)
Potassium: 4.6 mmol/L (ref 3.5–5.1)
Sodium: 144 mmol/L (ref 135–145)

## 2023-09-16 LAB — CBC
HCT: 26.9 % — ABNORMAL LOW (ref 39.0–52.0)
Hemoglobin: 8.5 g/dL — ABNORMAL LOW (ref 13.0–17.0)
MCH: 32.3 pg (ref 26.0–34.0)
MCHC: 31.6 g/dL (ref 30.0–36.0)
MCV: 102.3 fL — ABNORMAL HIGH (ref 80.0–100.0)
Platelets: 322 10*3/uL (ref 150–400)
RBC: 2.63 MIL/uL — ABNORMAL LOW (ref 4.22–5.81)
RDW: 16.2 % — ABNORMAL HIGH (ref 11.5–15.5)
WBC: 11.8 10*3/uL — ABNORMAL HIGH (ref 4.0–10.5)
nRBC: 0 % (ref 0.0–0.2)

## 2023-09-16 LAB — MAGNESIUM
Magnesium: 1.7 mg/dL (ref 1.7–2.4)
Magnesium: 2.2 mg/dL (ref 1.7–2.4)

## 2023-09-16 LAB — PHOSPHORUS: Phosphorus: 3.5 mg/dL (ref 2.5–4.6)

## 2023-09-16 SURGERY — INSERTION OF DIALYSIS CATHETER
Anesthesia: General | Site: Neck | Laterality: Right

## 2023-09-16 MED ORDER — METOPROLOL TARTRATE 5 MG/5ML IV SOLN
INTRAVENOUS | Status: AC
  Filled 2023-09-16: qty 5

## 2023-09-16 MED ORDER — ROCURONIUM BROMIDE 10 MG/ML (PF) SYRINGE
PREFILLED_SYRINGE | INTRAVENOUS | Status: DC | PRN
  Administered 2023-09-16: 30 mg via INTRAVENOUS

## 2023-09-16 MED ORDER — SODIUM CHLORIDE 0.9 % IV SOLN: INTRAVENOUS | Status: DC

## 2023-09-16 MED ORDER — FENTANYL CITRATE (PF) 250 MCG/5ML IJ SOLN
INTRAMUSCULAR | Status: DC | PRN
  Administered 2023-09-16: 100 ug via INTRAVENOUS
  Administered 2023-09-16: 50 ug via INTRAVENOUS

## 2023-09-16 MED ORDER — ONDANSETRON HCL 4 MG/2ML IJ SOLN: 4.0000 mg | Freq: Once | INTRAMUSCULAR | Status: DC | PRN

## 2023-09-16 MED ORDER — VASOPRESSIN 20 UNIT/ML IV SOLN
INTRAVENOUS | Status: AC
  Filled 2023-09-16: qty 1

## 2023-09-16 MED ORDER — FENTANYL CITRATE (PF) 100 MCG/2ML IJ SOLN: 25.0000 ug | INTRAMUSCULAR | Status: DC | PRN

## 2023-09-16 MED ORDER — PHENYLEPHRINE 80 MCG/ML (10ML) SYRINGE FOR IV PUSH (FOR BLOOD PRESSURE SUPPORT)
PREFILLED_SYRINGE | INTRAVENOUS | Status: DC | PRN
  Administered 2023-09-16: 240 ug via INTRAVENOUS
  Administered 2023-09-16: 160 ug via INTRAVENOUS

## 2023-09-16 MED ORDER — HEPARIN SODIUM (PORCINE) 1000 UNIT/ML IJ SOLN
INTRAMUSCULAR | Status: DC | PRN
  Administered 2023-09-16: 1000 [IU] via INTRAVENOUS

## 2023-09-16 MED ORDER — SUCCINYLCHOLINE CHLORIDE 200 MG/10ML IV SOSY
PREFILLED_SYRINGE | INTRAVENOUS | Status: DC | PRN
  Administered 2023-09-16: 160 mg via INTRAVENOUS

## 2023-09-16 MED ORDER — INSULIN ASPART 100 UNIT/ML IJ SOLN: 0.0000 [IU] | INTRAMUSCULAR | Status: DC | PRN

## 2023-09-16 MED ORDER — 0.9 % SODIUM CHLORIDE (POUR BTL) OPTIME
TOPICAL | Status: DC | PRN
  Administered 2023-09-16: 1000 mL

## 2023-09-16 MED ORDER — CHLORHEXIDINE GLUCONATE 0.12 % MT SOLN: 15.0000 mL | Freq: Once | OROMUCOSAL | Status: AC

## 2023-09-16 MED ORDER — HEPARIN 6000 UNIT IRRIGATION SOLUTION
Status: DC | PRN
  Administered 2023-09-16: 1

## 2023-09-16 MED ORDER — DARBEPOETIN ALFA 100 MCG/0.5ML IJ SOSY
100.0000 ug | PREFILLED_SYRINGE | INTRAMUSCULAR | Status: DC
  Administered 2023-09-16 – 2023-09-30 (×3): 100 ug via SUBCUTANEOUS
  Filled 2023-09-16 (×3): qty 0.5

## 2023-09-16 MED ORDER — CEFAZOLIN SODIUM-DEXTROSE 2-4 GM/100ML-% IV SOLN
2.0000 g | Freq: Once | INTRAVENOUS | Status: AC
  Administered 2023-09-16: 2 g via INTRAVENOUS
  Filled 2023-09-16: qty 100

## 2023-09-16 MED ORDER — FENTANYL CITRATE (PF) 100 MCG/2ML IJ SOLN
INTRAMUSCULAR | Status: AC
  Filled 2023-09-16: qty 2

## 2023-09-16 MED ORDER — VASOPRESSIN 20 UNIT/ML IV SOLN
INTRAVENOUS | Status: DC | PRN
  Administered 2023-09-16 (×2): .4 [IU] via INTRAVENOUS

## 2023-09-16 MED ORDER — AMIODARONE HCL IN DEXTROSE 360-4.14 MG/200ML-% IV SOLN: 60.0000 mg/h | INTRAVENOUS | Status: DC

## 2023-09-16 MED ORDER — HEPARIN 6000 UNIT IRRIGATION SOLUTION
Status: AC
  Filled 2023-09-16: qty 500

## 2023-09-16 MED ORDER — PHENYLEPHRINE HCL-NACL 20-0.9 MG/250ML-% IV SOLN
INTRAVENOUS | Status: DC | PRN
  Administered 2023-09-16: 50 ug/min via INTRAVENOUS

## 2023-09-16 MED ORDER — ORAL CARE MOUTH RINSE: 15.0000 mL | Freq: Once | OROMUCOSAL | Status: AC

## 2023-09-16 MED ORDER — METOPROLOL TARTRATE 5 MG/5ML IV SOLN
2.5000 mg | Freq: Four times a day (QID) | INTRAVENOUS | Status: DC | PRN
  Administered 2023-09-17 – 2023-09-18 (×2): 2.5 mg via INTRAVENOUS
  Filled 2023-09-16 (×2): qty 5

## 2023-09-16 MED ORDER — ONDANSETRON HCL 4 MG/2ML IJ SOLN
INTRAMUSCULAR | Status: DC | PRN
  Administered 2023-09-16: 4 mg via INTRAVENOUS

## 2023-09-16 MED ORDER — SUGAMMADEX SODIUM 200 MG/2ML IV SOLN
INTRAVENOUS | Status: DC | PRN
  Administered 2023-09-16: 200 mg via INTRAVENOUS

## 2023-09-16 MED ORDER — CHLORHEXIDINE GLUCONATE 0.12 % MT SOLN
OROMUCOSAL | Status: AC
  Administered 2023-09-16: 15 mL via OROMUCOSAL
  Filled 2023-09-16: qty 15

## 2023-09-16 MED ORDER — MAGNESIUM SULFATE 2 GM/50ML IV SOLN
2.0000 g | Freq: Once | INTRAVENOUS | Status: AC
  Administered 2023-09-16: 2 g via INTRAVENOUS
  Filled 2023-09-16: qty 50

## 2023-09-16 MED ORDER — MAGNESIUM FOR TPN
INJECTION | INTRAVENOUS | Status: DC
  Filled 2023-09-16: qty 772.8

## 2023-09-16 MED ORDER — ALBUMIN HUMAN 5 % IV SOLN: INTRAVENOUS | Status: DC | PRN

## 2023-09-16 MED ORDER — DEXAMETHASONE SODIUM PHOSPHATE 10 MG/ML IJ SOLN
INTRAMUSCULAR | Status: DC | PRN
  Administered 2023-09-16: 5 mg via INTRAVENOUS

## 2023-09-16 MED ORDER — METOPROLOL TARTRATE 5 MG/5ML IV SOLN
5.0000 mg | Freq: Once | INTRAVENOUS | Status: AC
  Administered 2023-09-16: 5 mg via INTRAVENOUS

## 2023-09-16 MED ORDER — LIDOCAINE 2% (20 MG/ML) 5 ML SYRINGE
INTRAMUSCULAR | Status: DC | PRN
  Administered 2023-09-16: 100 mg via INTRAVENOUS

## 2023-09-16 MED ORDER — HEPARIN SODIUM (PORCINE) 1000 UNIT/ML IJ SOLN
INTRAMUSCULAR | Status: AC
  Filled 2023-09-16: qty 10

## 2023-09-16 MED ORDER — AMIODARONE HCL IN DEXTROSE 360-4.14 MG/200ML-% IV SOLN: 30.0000 mg/h | INTRAVENOUS | Status: DC

## 2023-09-16 MED ORDER — DILTIAZEM HCL-DEXTROSE 125-5 MG/125ML-% IV SOLN (PREMIX)
5.0000 mg/h | INTRAVENOUS | Status: DC
  Administered 2023-09-16: 5 mg/h via INTRAVENOUS
  Filled 2023-09-16: qty 125

## 2023-09-16 MED ORDER — ORAL CARE MOUTH RINSE
15.0000 mL | Freq: Once | OROMUCOSAL | Status: AC
  Administered 2023-09-16: 15 mL via OROMUCOSAL

## 2023-09-16 MED ORDER — DEXTROSE 10 % IV SOLN
INTRAVENOUS | Status: DC
  Filled 2023-09-16: qty 1000

## 2023-09-16 MED ORDER — LACTATED RINGERS IV BOLUS
1000.0000 mL | INTRAVENOUS | Status: AC
  Administered 2023-09-17: 1 mL via INTRAVENOUS

## 2023-09-16 MED ORDER — PROPOFOL 10 MG/ML IV BOLUS
INTRAVENOUS | Status: AC
  Filled 2023-09-16: qty 20

## 2023-09-16 MED ORDER — AMIODARONE LOAD VIA INFUSION: 150.0000 mg | INTRAVENOUS | Status: DC

## 2023-09-16 MED ORDER — FENTANYL CITRATE (PF) 250 MCG/5ML IJ SOLN
INTRAMUSCULAR | Status: AC
  Filled 2023-09-16: qty 5

## 2023-09-16 MED ORDER — PROPOFOL 10 MG/ML IV BOLUS
INTRAVENOUS | Status: DC | PRN
  Administered 2023-09-16: 200 mg via INTRAVENOUS

## 2023-09-16 SURGICAL SUPPLY — 35 items
BAG COUNTER SPONGE SURGICOUNT (BAG) ×2 IMPLANT
BAG DECANTER FOR FLEXI CONT (MISCELLANEOUS) ×2 IMPLANT
BIOPATCH RED 1 DISK 7.0 (GAUZE/BANDAGES/DRESSINGS) ×2 IMPLANT
CATH PALINDROME-P 19CM W/VT (CATHETERS) IMPLANT
CATH PALINDROME-P 28CM W/VT (CATHETERS) IMPLANT
CLIP TI MEDIUM 6 (CLIP) ×2 IMPLANT
COVER PROBE W GEL 5X96 (DRAPES) ×2 IMPLANT
COVER SURGICAL LIGHT HANDLE (MISCELLANEOUS) ×2 IMPLANT
DERMABOND ADVANCED .7 DNX12 (GAUZE/BANDAGES/DRESSINGS) ×2 IMPLANT
DRAPE CHEST BREAST 15X10 FENES (DRAPES) ×2 IMPLANT
GLIDEWIRE ADV .035X180CM (WIRE) IMPLANT
GOWN STRL REUS W/ TWL LRG LVL3 (GOWN DISPOSABLE) ×2 IMPLANT
GOWN STRL REUS W/ TWL XL LVL3 (GOWN DISPOSABLE) ×2 IMPLANT
KIT BASIN OR (CUSTOM PROCEDURE TRAY) ×2 IMPLANT
KIT PALINDROME-P 55CM (CATHETERS) IMPLANT
KIT TURNOVER KIT B (KITS) ×2 IMPLANT
NDL 18GX1X1/2 (RX/OR ONLY) (NEEDLE) ×2 IMPLANT
NDL HYPO 25GX1X1/2 BEV (NEEDLE) ×2 IMPLANT
NEEDLE 18GX1X1/2 (RX/OR ONLY) (NEEDLE) ×2 IMPLANT
NEEDLE HYPO 25GX1X1/2 BEV (NEEDLE) ×2 IMPLANT
NS IRRIG 1000ML POUR BTL (IV SOLUTION) ×2 IMPLANT
PACK SRG BSC III STRL LF ECLPS (CUSTOM PROCEDURE TRAY) ×2 IMPLANT
PAD ARMBOARD POSITIONER FOAM (MISCELLANEOUS) ×4 IMPLANT
POWDER SURGICEL 3.0 GRAM (HEMOSTASIS) IMPLANT
SET MICROPUNCTURE 5F STIFF (MISCELLANEOUS) IMPLANT
SOAP 2 % CHG 4 OZ (WOUND CARE) ×2 IMPLANT
SUT ETHILON 3 0 PS 1 (SUTURE) ×2 IMPLANT
SUT MNCRL AB 4-0 PS2 18 (SUTURE) ×2 IMPLANT
SYR 10ML LL (SYRINGE) ×2 IMPLANT
SYR 20ML LL LF (SYRINGE) ×4 IMPLANT
SYR 5ML LL (SYRINGE) ×2 IMPLANT
SYR CONTROL 10ML LL (SYRINGE) ×2 IMPLANT
TOWEL GREEN STERILE (TOWEL DISPOSABLE) ×2 IMPLANT
TOWEL GREEN STERILE FF (TOWEL DISPOSABLE) ×4 IMPLANT
WATER STERILE IRR 1000ML POUR (IV SOLUTION) ×2 IMPLANT

## 2023-09-16 NOTE — Progress Notes (Signed)
   09/16/23 2024  BiPAP/CPAP/SIPAP  BiPAP/CPAP/SIPAP Pt Type Adult  BiPAP/CPAP/SIPAP SERVO  Pressure Support 8 cmH20  PEEP 5 cmH20  FiO2 (%) 40 %  Minute Ventilation 10  Leak 40  Peak Inspiratory Pressure (PIP) 13  Tidal Volume (Vt) 524    RT called to pt. Room by Rn because vent was alarming @2018 . On arrival RT noticed the mask was off of pt. Face and pt. was screaming for help stating " I can't breathe". This RT placed pt. Back on bipap 13/5 40% and called out for nurse to come to bedside.

## 2023-09-16 NOTE — Plan of Care (Signed)

## 2023-09-16 NOTE — Op Note (Signed)
 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  Removal of right IJ temporary dialysis catheter 2.  Ultrasound-guided access right internal jugular vein 3.  Right internal jugular vein tunneled dialysis catheter (23 cm palindrome catheter)  Surgeon: Dr. Young Hensen, MD  Assistant: OR staff  Indications: Patient is a 78 year old male with multiple comorbidities including acute kidney injury on stage IV CKD with progression to new ESRD.  He has a right IJ [facility] given appendicitis requiring appendectomy and drain placement for perforation this admission.  Vascular surgery has now been asked to place tunneled dialysis catheter.  He presents after risks and benefits discussed.  Findings: The right IJ temporary catheter was removed and I tied a pursestring around the access site with good hemostasis.  The internal jugular vein was then accessed in a new location on the right neck and I placed a tunneled 23 cm palindrome with the tip in the right atrium that was tunneled through the right chest wall and this flushed and aspirated easily.  Anesthesia: General  Details: Patient was taken to the operating room after informed consent was obtained.  Placed on operative table in the supine position.  General endotracheal anesthesia was induced.  The right neck and the existing catheter were then prepped and draped in standard sterile fashion.  Antibiotics were given and timeout performed.  I elected to go ahead and remove his existing temporary catheter and I cut the Prolene sutures and then I placed a 4-0 Monocryl pursestring around the access site in the neck.  The catheter was removed passed off the field and tied down the pursestring with good hemostasis.  I then used sterile ultrasound probe and evaluated the right internal jugular vein, it was patent, an image was saved.  This was accessed with  micro access needle and placed a microwire and a micro sheath.  I then advanced a Glidewire advantage into the right atrium and measured a 23 cm palindrome catheter on the right chest wall.  I made a counterincision on the chest wall for the exit site of the catheter and then I tunneled the catheter from the chest wall exit site to the IJ stick site making sure the cuff was under the skin.  I then dilated over the wire with a dilator and placed a large dilator peel-away sheath into the right atrium.  The inner wire and dilator were removed.  I then advanced the catheter through the sheath and and it was advanced with the tip of the catheter in the right atrium.  I checked this under fluoroscopy to make sure it was not kinked.  It flushed and aspirated easily and I did have to reposition it slightly.  It was secured with multiple 3-0 nylons.  The neck insertion site was secured with 4-0 Monocryl.  Dermabond was applied.  It was loaded with heparinized saline according to manufactures recommendations.  Complication: None  Condition: Stable  Young Hensen, MD [facility] Office: [phone]   Young Hensen

## 2023-09-16 NOTE — Progress Notes (Addendum)
 PHARMACY - TOTAL PARENTERAL NUTRITION CONSULT NOTE  Indication: Prolonged ileus  Patient Measurements: Height: 5\' 9"  (175.3 cm) Weight: 85.1 kg (187 lb 9.8 oz) IBW/kg (Calculated) : 70.7 TPN AdjBW (KG): 78.4 Body mass index is 27.71 kg/m. Usual Weight: 206-215lb per chart review and wife  Assessment:  78 yo M with acute appendicitis s/p appendectomy with persistent ileus. 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 Patient with little to no intake since admission (9 days) due to procedures and bloody drain output. Spoke with patient's wife who states patient eats a variety of foods (vegetable soup with beef, hamburgers, fries etc) 5-6 times a day at home. Last good meal was 5/3 prior to admission. Pharmacy consulted for TPN.   Glucose / Insulin : A1c 7.3% on glimepiride  PTA - CBGs improving Used 33 units SSI/24hr and 65 units in TPN Electrolytes: K 3.9 (goal >/= 4 for ileus), Mag 1.7 (goal >/= 2 for ileus), CoCa high at 10.8 (none in TPN), others WNL Renal: new ESRD, HD started 5/14, now on TTS schedule - last 5/20. BUN 83 Hepatic: AST/ALT elevated and trending up, tbili / alk phos WNL, albumin  1.8, TG elevated at 228 Intake / Output; MIVF: UOP 0.3 ml/kg/hr, drain 10 mL, LBM 5/19 GI Imaging: 5/5 CT: acute appendicitis with possible small abscess  5/8 CT: drain in place, mild SB distension likely mild ileus  5/9 KUB: increased gas in SB and colon, likely adynamic ileus or early pSBO 5/11 KUB: Gas distended small bowel throughout the abdomen, unchanged.  5/12 KUB: Diffuse gaseous distention of bowel loops in the abdomen, SBO vs ileus  5/13 KUB: mild diffuse gaseous dilatation of large and small bowel without significant change 5/14 CT: Scattered loops of mildly dilated small bowel, possibly postop ileus  5/14 KUB: Multiple dilated loops of small bowel.  5/16 KUB: Diffuse gaseous SB dilatation within the visualized upper abdomen 5/20 CT ab/p: ileus vs pSBO. Slight decrease in  dilated bowel loops size.  GI Surgeries / Procedures:  5/6 laparoscopic appendectomy with JP drain  Central access: triple lumen HD cath 09/08/23 TPN start date: 09/08/23  Nutritional Goals: Goal concentrated TPN rate is 70 mL/hr (provides 116g AA and 2192 kCals per day)  RD Estimated Needs Total Energy Estimated Needs: 2150-2350 Total Protein Estimated Needs: 115-135 grams Total Fluid Estimated Needs: UOP + 1L  Current Nutrition:  TPN 5/17 CLD > 5/21 FLD, NPO for tunneled dialysis cath placement  Plan:  Continue concentrated TPN at goal rate of 70 mL/hr, meeting 100% of estimated needs Electrolytes in TPN: Na 75 mEq/L, tweak to K 35 mEq/L (= 59 mEq/d), Ca 0 mEq/L, add Mg 5 mEq/L on 5/21, Phos 8 mmol/L, Cl:Ac 1:1.  Add standard MVI and trace elements to TPN Continue resistant SSI Q4H and 65 units insulin  in TPN Mag sulfate 2gm IV x 1 Monitor TPN labs on Mon/Thurs - check post-HD labs in AM F/u diet resumption/advancement to wean TPN vs cycling  Christian [name], PharmD, BCPS, BCCCP 09/16/2023, 7:19 AM

## 2023-09-16 NOTE — Progress Notes (Addendum)
 Narrow complex SVT/atrial flutter RN reported that telemetry notified him patient has sustained V. tach.  Throughout the day patient has on and off sinus tachycardia and currently on scheduled metoprolol  and as needed metoprolol  as well.  Obtaining stat EKG, checking BMP and mag level.  Update, EKG showing supraventricular tachycardia/atrial flutter.  Discussed with on-call cardiology Dr. Donna Fus recommended to start Cardizem  drip given patient has preserved EF and hold off the IV metoprolol .  Can gradually wean down the Cardizem  drip and resume IV/home metoprolol  once appropriate. Obtaining repeat echocardiogram Transferring patient to CV progressive unit.  Jahlia Omura, MD [facility] 09/16/2023, 10:25 PM    Addendum - After starting the Cardizem  drip patient's blood pressure has been dropped to 83/30.  Still continuing to having SVT heart rate up to 160s range.  Given patient has low blood pressure discontinuing the Cardizem  drip, giving 1 L of LR bolus.  (Unable to give excessive fluid as ESRD patient on HD), giving amiodarone  150 mL bolus and starting amiodarone  drip. - Discussed case with PCCM Dr. Mason Sole to evaluate patient for hypotension and persistent SVT  Jetty Mort, MD [facility] 09/17/2023, 12:05 AM

## 2023-09-16 NOTE — Progress Notes (Signed)
 Patient Heart rate went back to 150 informed Dr. Ulice Gamer and relayed Telemetry report of sustained SVT   Charge Nurse informed

## 2023-09-16 NOTE — Progress Notes (Signed)
 Pt transported on BiPAP from PACU to 3W02 w/o any complications.

## 2023-09-16 NOTE — Progress Notes (Signed)
 Pt. Transported on bipap from 3W02 to 4E17 on 100% O2 with RN and RT. RT assigned to unit notified. Pt. Is resting comfortably at this time.

## 2023-09-16 NOTE — Progress Notes (Signed)
 [facility]  Subjective  -no complaints   Objective (!) 150/76 (!) 112 98.7 F (37.1 C) (Axillary) (!) 22 98%  Intake/Output Summary (Last 24 hours) at 09/16/2023 1208 Last data filed at 09/16/2023 0539 Gross per 24 hour  Intake 307.11 ml  Output 700 ml  Net -392.89 ml   Right IJ temp cath  Laboratory Lab Results: Recent Labs    09/14/23 0646 09/16/23 0435  WBC 13.2* 11.8*  HGB 8.8* 8.5*  HCT 27.3* 26.9*  PLT 371 322   BMET Recent Labs    09/15/23 0920 09/16/23 0435  NA 140 142  K 3.9 3.9  CL 103 104  CO2 26 23  GLUCOSE 204* 167*  BUN 63* 83*  CREATININE 3.54* 4.04*  CALCIUM  8.7* 9.1    COAG Lab Results  Component Value Date   INR 1.1 10/04/2019   INR 1.2 09/14/2019   INR 1.3 (H) 08/19/2019   No results found for: "PTT"  Assessment/Planning:  78 year old male with plan to convert his temporary right IJ TDC to a tunneled dialysis catheter.  Risk benefits discussed.  Christian [name] 09/16/2023 12:08 PM --

## 2023-09-16 NOTE — Transfer of Care (Signed)
 Immediate Anesthesia Transfer of Care Note  Patient: Christian [name]  Procedure(s) Performed: INSERTION OF DIALYSIS CATHETER REMOVAL OF TEMPORARY TUNNELED HEMODIALYSIS CATHETER (Right: Neck) ULTRASOUND GUIDANCE, FOR VASCULAR ACCESS (Right: Neck)  Patient Location: PACU  Anesthesia Type:General  Level of Consciousness: awake, alert , and oriented  Airway & Oxygen Therapy: Patient Spontanous Breathing  Post-op Assessment: Report given to RN and Post -op Vital signs reviewed and unstable, Anesthesiologist notified  Post vital signs: Reviewed  Last Vitals:  Vitals Value Taken Time  BP 186/90 09/16/23 1439  Temp 37.4 C 09/16/23 1435  Pulse 132 09/16/23 1443  Resp 37 09/16/23 1443  SpO2 98 % 09/16/23 1443  Vitals shown include unfiled device data.  Last Pain:  Vitals:   09/16/23 1215  TempSrc:   PainSc: 5       Patients Stated Pain Goal: 1 (09/16/23 0021)  Complications: Patient placed on BiPAP

## 2023-09-16 NOTE — Anesthesia Procedure Notes (Signed)
 Procedure Name: Intubation Date/Time: 09/16/2023 1:19 PM  Performed by: Loreda Rodriguez, CRNAPre-anesthesia Checklist: Patient identified, Emergency Drugs available, Suction available and Patient being monitored Patient Re-evaluated:Patient Re-evaluated prior to induction Oxygen Delivery Method: Circle System Utilized Preoxygenation: Pre-oxygenation with 100% oxygen Induction Type: IV induction Laryngoscope Size: Glidescope and 3 Grade View: Grade I Tube type: Oral Tube size: 7.0 mm Number of attempts: 1 Airway Equipment and Method: Stylet and Oral airway Placement Confirmation: ETT inserted through vocal cords under direct vision, positive ETCO2 and breath sounds checked- equal and bilateral Secured at: 22 cm Tube secured with: Tape Dental Injury: Teeth and Oropharynx as per pre-operative assessment  Comments: RSI w VAL, no e/o aspiration or regurgitation

## 2023-09-16 NOTE — Progress Notes (Signed)
 Patient is agitated and removing cpap. Called Respiratory for assistance.

## 2023-09-16 NOTE — Progress Notes (Signed)
 [facility] PROGRESS NOTE    Progress Note  KEVYN BOQUET  ZOX:[id number] DOB: [birth year] DOA: 08/30/2023 PCP: [name], MD     Brief Narrative:   Christian [name] is an 78 y.o. male past medical history of CABG postop CVA, chronic kidney disease stage IV who underwent laparoscopic appendectomy by general surgery on 08/31/2023 developed a postoperative ileus, JP drain in place, and NG tube to suction developed acute kidney injury, nephrology was consulted and is undergoing hemodialysis, we were unable to use his old AV fistula so he underwent HD catheter placement on the right by IR on 09/08/2022, CT scan of the abdomen pelvis on 09/08/2022 showed GB drainage with hematoma and persistent postop ileus.  NG tube had to be placed to intermittent suction as he was complaining of nausea    Assessment/Plan:   Acute perforated Appendicitis abscess, status post surgical intervention and now has developed a postop ileus: Previous 14-day course of IV Zosyn  blood cultures remain negative. JP drain with minimal serosanguineous output. Improve, general surgery recommended TPN started on 09/08/2022. Out of bed to chair, working with physical therapy. Repeated CT scan on 09/08/2022 showed complex 5 x 11 x 4 complex fluid collection. ID was consulted due to persistent fever and leukocytosis on 09/13/2023 despite IV antibiotics General Surgery is more concerned about a hematoma rather than abscess. Pain to come on in a few days. ID recommended to hold on antibiotics.  Acute renal failure superimposed on stage 4 chronic kidney disease (HCC) With a baseline creatinine around 3.2-3.5. Probably developed ATN in the setting of hypotension and blood loss, nephrology was consulted HD catheter was placed now on dialysis. Vascular surgery was consulted fistulogram performed 09/07/2023 with balloon venoplasty to the left fistula. Underwent right IJ temporary catheter placement by IR on  09/08/2022. Continue dialysis per nephrology. No signs of renal recovery. Vascular recommended n.p.o. and they will attempt tunneled dialysis catheter placement on 09/16/2023.  Acute blood loss anemia: Hemoglobin around 10 there was a drop to 7.5, transfuse 1 unit packed red blood cells his hemoglobin has remained relatively stable. This morning is 8.5.  Essential hypertension: Currently on IV metoprolol  every 6 hours, and hydralazine  10 every 6 hours as needed.  Diabetes mellitus type 2: Getting insulin  through his TPN by pharmacy, blood glucose relatively controlled.  CAD: Evaluated by cardiology no further ischemic evaluation.  Thrombocytosis: Likely reactive.  Electrolyte imbalance hypokalemia/hypomagnesemia: Continue to correct during HD and TPN.  Panic attack: He required 2 to 3 L of oxygen. VQ scan showed very low probability. Chest x-ray showed atelectasis with small pleural effusion, there is probably a defect seen on VQ scan.  Severe protein caloric malnutrition: Noted continue TPN.   DVT prophylaxis: heparin  Family Communication:none Status is: Inpatient Remains inpatient appropriate because: Acute appendicitis now with acute kidney injury    Code Status:     Code Status Orders  (From admission, onward)           Start     Ordered   08/31/23 0416  Full code  Continuous       Question:  By:  Answer:  Consent: discussion documented in EHR   08/31/23 0416           Code Status History     Date Active Date Inactive Code Status Order ID Comments User Context   12/12/2020 0212 12/16/2020 1900 Full Code [id number]  Dea Evert, DO ED   10/03/2020 1828 10/07/2020 1752 Full Code [id number]  Ozell Blunt, MD Inpatient   08/09/2019 1913 10/18/2019 1749 Full Code [id number]  Knox Perl, MD ED   09/27/2014 1331 10/01/2014 2151 Full Code [id number]  Annamarie Kid, MD Inpatient   02/27/2014 1233 02/28/2014 1432 Full Code [id number]  Knox Perl, MD Inpatient          IV Access:   Peripheral IV   Procedures and diagnostic studies:   NM Pulmonary Perfusion Result Date: 09/15/2023 CLINICAL DATA:  Dyspnea EXAM: NUCLEAR MEDICINE PERFUSION LUNG SCAN TECHNIQUE: Perfusion images were obtained in multiple projections after intravenous injection of radiopharmaceutical. Ventilation scans intentionally deferred if perfusion scan and chest x-ray adequate for interpretation during COVID 19 epidemic. RADIOPHARMACEUTICALS:  4.2 mCi Tc-52m MAA IV COMPARISON:  None Available. FINDINGS: There are small subsegmental defects within the posterior basal left lower lobe which are not segmental in nature and likely correspond to areas of rounded atelectasis noted on CT examination of 12/12/2020. Tiny defect within the lateral right mid lung zone is not segmental and may relate to fluid within the fissure. No segmental perfusion defects are identified. IMPRESSION: No evidence of pulmonary embolism Electronically Signed   By: Worthy Heads M.D.   On: 09/15/2023 19:40   DG CHEST PORT 1 VIEW Result Date: 09/15/2023 CLINICAL DATA:  Dyspnea. EXAM: PORTABLE CHEST 1 VIEW COMPARISON:  Sep 09, 2023. FINDINGS: Stable cardiomediastinal silhouette. Status post coronary bypass graft. Nasogastric tube is seen entering stomach. Right internal jugular catheter is unchanged. Bibasilar atelectasis is noted with small pleural effusions. IMPRESSION: Mild bibasilar atelectasis with small pleural effusions. Electronically Signed   By: Rosalene Colon M.D.   On: 09/15/2023 14:10     Medical Consultants:   None.   Subjective:    [name] complaining of pain from the right side of his neck all the way to his right foot, also having pain in the left foot.  Objective:    Vitals:   09/15/23 1959 09/15/23 2000 09/15/23 2341 09/16/23 0419  BP: 125/64  (!) 142/70 119/73  Pulse: (!) 102   (!) 109  Resp: (!) 22 (!) 22 20 20   Temp: 98.1 F (36.7 C)  98.3 F (36.8 C) 97.9 F (36.6 C)   TempSrc: Axillary  Oral Oral  SpO2: 98%  100% 97%  Weight:      Height:       SpO2: 97 % O2 Flow Rate (L/min): 2 L/min FiO2 (%): (!) 3 %   Intake/Output Summary (Last 24 hours) at 09/16/2023 0704 Last data filed at 09/16/2023 0539 Gross per 24 hour  Intake 307.11 ml  Output 710 ml  Net -402.89 ml   Filed Weights   09/14/23 0849 09/14/23 1030 09/14/23 1258  Weight: 98.2 kg 92 kg 85.1 kg    Exam: General exam: In no acute distress. Respiratory system: Good air movement and clear to auscultation. Cardiovascular system: S1 & S2 heard, RRR. No JVD. Gastrointestinal system: Abdomen is nondistended, soft and nontender.  Extremities: Edema bilaterally Skin: No rashes, lesions or ulcers Psychiatry: Judgement and insight appear normal. Mood & affect appropriate.    Data Reviewed:    Labs: Basic Metabolic Panel: Recent Labs  Lab 09/11/23 0546 09/12/23 0746 09/13/23 0559 09/14/23 0646 09/15/23 0920 09/16/23 0435  NA 138 137 140 144 140 142  K 3.2* 3.4* 3.4* 3.8 3.9 3.9  CL 90* 95* 99 102 103 104  CO2 29 29 28 26 26 23   GLUCOSE 239* 245* 209* 200* 204* 167*  BUN 91* 53* 74*  91* 63* 83*  CREATININE 5.86* 3.78* 4.59* 4.93* 3.54* 4.04*  CALCIUM  8.4* 8.4* 8.5* 8.7* 8.7* 9.1  MG 2.2 1.8 2.0  --  1.6* 1.7  PHOS 3.9 1.8* 2.2* 3.5 3.4 3.5   GFR Estimated Creatinine Clearance: 16.8 mL/min (A) (by C-G formula based on SCr of 4.04 mg/dL (H)). Liver Function Tests: Recent Labs  Lab 09/10/23 1050 09/13/23 0559 09/15/23 0920 09/16/23 0435  AST  --  70*  --  102*  ALT  --  45*  --  105*  ALKPHOS  --  83  --  97  BILITOT  --  1.7*  --  0.8  PROT  --  5.9*  --  6.3*  ALBUMIN  1.7* 1.7* 1.8* 1.8*   No results for input(s): "LIPASE", "AMYLASE" in the last 168 hours. No results for input(s): "AMMONIA" in the last 168 hours. Coagulation profile No results for input(s): "INR", "PROTIME" in the last 168 hours. COVID-19 Labs  No results for input(s): "DDIMER", "FERRITIN",  "LDH", "CRP" in the last 72 hours.  Lab Results  Component Value Date   SARSCOV2NAA NEGATIVE 12/11/2020   SARSCOV2NAA NEGATIVE 10/03/2020   SARSCOV2NAA NEGATIVE 10/17/2019   SARSCOV2NAA NEGATIVE 10/13/2019    CBC: Recent Labs  Lab 09/10/23 1049 09/11/23 0546 09/11/23 2140 09/13/23 0559 09/14/23 0646 09/16/23 0435  WBC 13.8* 12.6* 11.9* 12.8* 13.2* 11.8*  NEUTROABS 11.2* 10.3*  --  9.5*  --   --   HGB 8.5* 8.4* 7.5* 8.7* 8.8* 8.5*  HCT 25.7* 25.6* 23.5* 26.9* 27.3* 26.9*  MCV 100.0 100.8* 101.3* 100.0 99.6 102.3*  PLT 411* 420* 379 375 371 322   Cardiac Enzymes: No results for input(s): "CKTOTAL", "CKMB", "CKMBINDEX", "TROPONINI" in the last 168 hours. BNP (last 3 results) No results for input(s): "PROBNP" in the last 8760 hours. CBG: Recent Labs  Lab 09/15/23 1155 09/15/23 1623 09/15/23 2005 09/15/23 2342 09/16/23 0421  GLUCAP 212* 166* 167* 174* 163*   D-Dimer: No results for input(s): "DDIMER" in the last 72 hours. Hgb A1c: No results for input(s): "HGBA1C" in the last 72 hours. Lipid Profile: No results for input(s): "CHOL", "HDL", "LDLCALC", "TRIG", "CHOLHDL", "LDLDIRECT" in the last 72 hours. Thyroid  function studies: No results for input(s): "TSH", "T4TOTAL", "T3FREE", "THYROIDAB" in the last 72 hours.  Invalid input(s): "FREET3" Anemia work up: No results for input(s): "VITAMINB12", "FOLATE", "FERRITIN", "TIBC", "IRON", "RETICCTPCT" in the last 72 hours. Sepsis Labs: Recent Labs  Lab 09/11/23 2140 09/13/23 0559 09/14/23 0646 09/16/23 0435  WBC 11.9* 12.8* 13.2* 11.8*   Microbiology Recent Results (from the past 240 hours)  Culture, blood (Routine X 2) w Reflex to ID Panel     Status: None (Preliminary result)   Collection Time: 09/13/23 10:11 AM   Specimen: BLOOD RIGHT HAND  Result Value Ref Range Status   Specimen Description BLOOD RIGHT HAND  Final   Special Requests   Final    BOTTLES DRAWN AEROBIC AND ANAEROBIC Blood Culture adequate  volume   Culture   Final    NO GROWTH 3 DAYS Performed at [facility], [address] [place], [place] [address]    Report Status PENDING  Incomplete  Culture, blood (Routine X 2) w Reflex to ID Panel     Status: None (Preliminary result)   Collection Time: 09/13/23 10:11 AM   Specimen: BLOOD RIGHT HAND  Result Value Ref Range Status   Specimen Description BLOOD RIGHT HAND  Final   Special Requests   Final  BOTTLES DRAWN AEROBIC AND ANAEROBIC Blood Culture adequate volume   Culture   Final    NO GROWTH 3 DAYS Performed at [facility], [address] [place], [place] [address]    Report Status PENDING  Incomplete     Medications:    chlorhexidine   15 mL Mouth/Throat Once   Or   mouth rinse  15 mL Mouth Rinse Once   Chlorhexidine  Gluconate Cloth  6 each Topical Q0600   Chlorhexidine  Gluconate Cloth  6 each Topical Q0600   fluticasone  furoate-vilanterol  1 puff Inhalation Daily   heparin  injection (subcutaneous)  5,000 Units Subcutaneous Q8H   insulin  aspart  0-20 Units Subcutaneous Q4H   metoprolol  tartrate  10 mg Intravenous Q6H   sodium chloride  flush  3 mL Intravenous Q12H   Continuous Infusions:  sodium chloride      TPN ADULT (ION) 70 mL/hr at 09/15/23 2200      LOS: 16 days   Macdonald Savoy  [facility]  09/16/2023, 7:04 AM

## 2023-09-16 NOTE — Progress Notes (Signed)
 Spoke with Dr. Versa Gore regarding the PICC order.  Discussed with PICC nurse Cleophas Dadds, RN need for central line and approval for PICC from nephology.  Dr. Versa Gore states that he will speak with Dr. Jearldine Mina regarding next steps.

## 2023-09-16 NOTE — Anesthesia Postprocedure Evaluation (Signed)
 Anesthesia Post Note  Patient: Christian [name]  Procedure(s) Performed: INSERTION OF DIALYSIS CATHETER REMOVAL OF TEMPORARY TUNNELED HEMODIALYSIS CATHETER (Right: Neck) ULTRASOUND GUIDANCE, FOR VASCULAR ACCESS (Right: Neck)     Patient location during evaluation: PACU Anesthesia Type: General Level of consciousness: awake and alert, oriented and patient cooperative Pain management: pain level controlled Vital Signs Assessment: post-procedure vital signs reviewed and stable Respiratory status: spontaneous breathing, nonlabored ventilation and respiratory function stable Cardiovascular status: blood pressure returned to baseline and stable Postop Assessment: no apparent nausea or vomiting Anesthetic complications: no Comments: Some respiratory distress in PACU, resolved w/ bipap for short period of time   No notable events documented.  Last Vitals:  Vitals:   09/16/23 1450 09/16/23 1500  BP: 133/72 139/72  Pulse: (!) 107 (!) 107  Resp: (!) 32 (!) 34  Temp:    SpO2: 97% 97%    Last Pain:  Vitals:   09/16/23 1215  TempSrc:   PainSc: 5                  Jacquelyne Matte

## 2023-09-16 NOTE — Anesthesia Preprocedure Evaluation (Addendum)
 Anesthesia Evaluation  Patient identified by MRN, date of birth, ID band Patient awake    Reviewed: Allergy & Precautions, H&P , NPO status , Patient's Chart, lab work & pertinent test results, reviewed documented beta blocker date and time   Airway Mallampati: IV  TM Distance: >3 FB Neck ROM: Full    Dental  (+) Teeth Intact, Dental Advisory Given   Pulmonary former smoker, PE (2021) Quit smoking 1975, 10 pack year history S/p trach and removal 2021   Pulmonary exam normal breath sounds clear to auscultation       Cardiovascular hypertension (144/67 preop), Pt. on medications and Pt. on home beta blockers + Past MI, + Cardiac Stents, + CABG (CABG x 4 2021) and +CHF (LVEF 45-50%)  Normal cardiovascular exam Rhythm:Regular Rate:Normal  Echo 2022 1. Left ventricular ejection fraction, by estimation, is 45 to 50%. The  left ventricle has low normal function. Left ventricular endocardial  border not optimally defined to evaluate regional wall motion. Left  ventricular diastolic function could not be  evaluated. There is mild of the left ventricular, inferolateral wall.   2. Right ventricular systolic function is normal. The right ventricular  size is normal.   3. Left atrial size was mildly dilated.   4. The mitral valve is normal in structure. Trivial mitral valve  regurgitation.   5. The aortic valve is tricuspid. Aortic valve regurgitation is not  visualized. Mild to moderate aortic valve sclerosis/calcification is  present, without any evidence of aortic stenosis.   6. The inferior vena cava is normal in size with greater than 50%  respiratory variability, suggesting right atrial pressure of 3 mmHg.    Cath 2021 LM: Normal LAD: Ostial 95% stenosis Ramus: Ostial/prox 30% stenosis LCx: Ostial 75% stenosis RCA: Prox-mid diffuse 40% calcific disease         Distal RCA 70% ISR    Neuro/Psych  PSYCHIATRIC DISORDERS      Dementia CVA (postop CVA after CABG 2021)    GI/Hepatic Neg liver ROS, hiatal hernia,,, underwent laparoscopic appendectomy by general surgery on 08/31/2023 developed a postoperative ileus, JP drain in place, and NG tube to suction developed acute kidney injury   Endo/Other  diabetes    Renal/GU ESRF and DialysisRenal disease  negative genitourinary   Musculoskeletal negative musculoskeletal ROS (+)    Abdominal  (+) + obese  Peds negative pediatric ROS (+)  Hematology negative hematology ROS (+)   Anesthesia Other Findings Plavix    Reproductive/Obstetrics negative OB ROS                              Anesthesia Physical Anesthesia Plan  ASA: 4  Anesthesia Plan: General   Post-op Pain Management: Ofirmev  IV (intra-op)*   Induction: Intravenous, Rapid sequence and Cricoid pressure planned  PONV Risk Score and Plan: 2 and Ondansetron  and Treatment may vary due to age or medical condition  Airway Management Planned: Oral ETT and Video Laryngoscope Planned  Additional Equipment: None  Intra-op Plan:   Post-operative Plan: Extubation in OR  Informed Consent: I have reviewed the patients History and Physical, chart, labs and discussed the procedure including the risks, benefits and alternatives for the proposed anesthesia with the patient or authorized representative who has indicated his/her understanding and acceptance.     Dental advisory given  Plan Discussed with: CRNA  Anesthesia Plan Comments: (Access: RIJ HD cath, PIV 20G R arm Inc aspiration risk d/t postop ileus  Last airway note from lap appy 08/31/23: Laryngoscope Size: Annabell Key and 2 Grade View: Grade I Tube type: Oral Tube size: 7.0 mm Number of attempts: 1 )         Anesthesia Quick Evaluation

## 2023-09-16 NOTE — Progress Notes (Signed)
Report given to 4 E

## 2023-09-16 NOTE — Progress Notes (Signed)
 IV team consult received regarding need for another access for TPN as patient is having temporary line removed in OR for tunneled HDC.  Spoke with charge nurse as original requestor is not available.  Made aware that MD needs to be notified that another central line needs to be placed for this reason.  VAST is unable to place internal jugular central lines and PICCs are not placed in renal patients. Staff now aware that MD needs to be notified in OR.

## 2023-09-16 NOTE — Progress Notes (Signed)
 General Surgery Follow Up Note  Subjective:    Overnight Issues:  Cc throat pain and L ankle pain. Abdominal pain improving. Denies vomiting. Reports flatus. Tolerated some fulls yesterday afternoon.   Currently NPO for tunneled HD cath by VVS today.  Due to SOB had a pulm perfusion study yesterday - negative for PE. Objective:  Vital signs for last 24 hours: Temp:  [97.9 F (36.6 C)-98.5 F (36.9 C)] 98.1 F (36.7 C) (05/22 0739) Pulse Rate:  [102-119] 108 (05/22 0739) Resp:  [20-28] 28 (05/22 0739) BP: (119-148)/(64-73) 144/67 (05/22 0739) SpO2:  [97 %-100 %] 100 % (05/22 0739)  Hemodynamic parameters for last 24 hours:    Intake/Output from previous day: 05/21 0701 - 05/22 0700 In: 307.1 [I.V.:307.1] Out: 710 [Urine:700; Drains:10]  Intake/Output this shift: No intake/output data recorded.  Vent settings for last 24 hours:    Physical Exam:  Gen: laying flat. Tachypneic with some belly breathing Neuro: follows commands, alert, communicative HEENT: PERRL Neck: supple CV: RRR Pulm: unlabored breathing on RA Abd: soft, mid distention, non-tender, interval removal of LLQ drain GU: HD, foley, clear urine Extr: wwp, no edema  Results for orders placed or performed during the hospital encounter of 08/30/23 (from the past 24 hours)  Glucose, capillary     Status: Abnormal   Collection Time: 09/15/23 11:55 AM  Result Value Ref Range   Glucose-Capillary 212 (H) 70 - 99 mg/dL   Comment 1 Notify RN   Glucose, capillary     Status: Abnormal   Collection Time: 09/15/23  4:23 PM  Result Value Ref Range   Glucose-Capillary 166 (H) 70 - 99 mg/dL   Comment 1 Notify RN    Comment 2 Document in Chart   Glucose, capillary     Status: Abnormal   Collection Time: 09/15/23  8:05 PM  Result Value Ref Range   Glucose-Capillary 167 (H) 70 - 99 mg/dL   Comment 1 Notify RN   Glucose, capillary     Status: Abnormal   Collection Time: 09/15/23 11:42 PM  Result Value Ref Range    Glucose-Capillary 174 (H) 70 - 99 mg/dL   Comment 1 Notify RN   Glucose, capillary     Status: Abnormal   Collection Time: 09/16/23  4:21 AM  Result Value Ref Range   Glucose-Capillary 163 (H) 70 - 99 mg/dL   Comment 1 Notify RN   Comprehensive metabolic panel     Status: Abnormal   Collection Time: 09/16/23  4:35 AM  Result Value Ref Range   Sodium 142 135 - 145 mmol/L   Potassium 3.9 3.5 - 5.1 mmol/L   Chloride 104 98 - 111 mmol/L   CO2 23 22 - 32 mmol/L   Glucose, Bld 167 (H) 70 - 99 mg/dL   BUN 83 (H) 8 - 23 mg/dL   Creatinine, Ser 4.09 (H) 0.61 - 1.24 mg/dL   Calcium  9.1 8.9 - 10.3 mg/dL   Total Protein 6.3 (L) 6.5 - 8.1 g/dL   Albumin  1.8 (L) 3.5 - 5.0 g/dL   AST 811 (H) 15 - 41 U/L   ALT 105 (H) 0 - 44 U/L   Alkaline Phosphatase 97 38 - 126 U/L   Total Bilirubin 0.8 0.0 - 1.2 mg/dL   GFR, Estimated 15 (L) >60 mL/min   Anion gap 15 5 - 15  Magnesium      Status: None   Collection Time: 09/16/23  4:35 AM  Result Value Ref Range   Magnesium   1.7 1.7 - 2.4 mg/dL  Phosphorus     Status: None   Collection Time: 09/16/23  4:35 AM  Result Value Ref Range   Phosphorus 3.5 2.5 - 4.6 mg/dL  CBC     Status: Abnormal   Collection Time: 09/16/23  4:35 AM  Result Value Ref Range   WBC 11.8 (H) 4.0 - 10.5 K/uL   RBC 2.63 (L) 4.22 - 5.81 MIL/uL   Hemoglobin 8.5 (L) 13.0 - 17.0 g/dL   HCT 16.1 (L) 09.6 - 04.5 %   MCV 102.3 (H) 80.0 - 100.0 fL   MCH 32.3 26.0 - 34.0 pg   MCHC 31.6 30.0 - 36.0 g/dL   RDW 40.9 (H) 81.1 - 91.4 %   Platelets 322 150 - 400 K/uL   nRBC 0.0 0.0 - 0.2 %  Glucose, capillary     Status: Abnormal   Collection Time: 09/16/23  9:11 AM  Result Value Ref Range   Glucose-Capillary 152 (H) 70 - 99 mg/dL   Comment 1 Notify RN    Comment 2 Document in Chart     Assessment & Plan: The plan of care was discussed with the bedside dialysis nurse for the day, who is in agreement with this plan and no additional concerns were raised.   Present on Admission:   Appendicitis  HTN (hypertension)  Ischemic cardiomyopathy  Acute renal failure superimposed on stage 4 chronic kidney disease (HCC)  CAD (coronary artery disease)  (Resolved) Acute appendicitis  ABLA (acute blood loss anemia)    LOS: 16 days   Additional comments:I reviewed the patient's new clinical lab test results.   and I reviewed the patients new imaging test results.    POD 14 - s/p lap appy with JP drain placement for perforated appendicitis [name] 5/6  - prolonged ileus, NG removed 5/21 and advanced to fulls.  - CT abd pelvis 5/14 shows JP within a hematoma, post-op ileus with dilated small bowel, bilateral R>L pleural effusions.  - CT scan repeated 5/20 by ID shows slight increase in size of RLQ hematoma and interval decrease in small bowel dilation.  - afebrile x48h, blood Cx 5/19 NGTD, abx stopped  - JP removed 5/21 - encourage OOB, ambulation -  OR with Vascular today for tunneled HD cath     FEN - NPO for procedure, continue full support TPN for today then wean tomorrow, ok for soft diet after OR. Lines/tubes - temp HD cath R internal jugular, PIV x2 RUE, LUE w/ AV Fistula (non-functional), NGT , LLQ JP drain Foley - in placed, clear yellow urine VTE - heparin /plavix  on hold ID - Zosyn  d/c 5/21; WBC 11 from [address], PA-C [facility] Please see Amion for pager number during day hours 7:00am-4:30pm       09/16/2023  *Care during the described time interval was provided by me. I have reviewed this patient's available data, including medical history, events of note, physical examination and test results as part of my evaluation.

## 2023-09-16 NOTE — Progress Notes (Signed)
 RT called to pt. Room by Rn because vent was alarming. Upon arrival RT noticed the mask was off of pt. Face and pt. Was screaming for help stating " I can't breathe". This RT placed pt. Back on bipap 15/5 40% and called out for nurse to come to bedside.

## 2023-09-16 NOTE — Progress Notes (Signed)
 Started cardizem  drip as ordered.

## 2023-09-16 NOTE — Progress Notes (Signed)
 Patient heart rate at 159 given metoprolol  IV PRN. Observed the patient Heart rate.

## 2023-09-16 NOTE — Progress Notes (Signed)
Rapid response notified.

## 2023-09-16 NOTE — Progress Notes (Signed)
     Leukocytosis trending down 09/14/23 13.2 now 09/16/23 11.8.   Afebril last 48 hours.  He has been managed for active infection concerning for appendicitis with possible periappendiceal abscess.     Completed 13 days of IV Zosyn  . Blood cultures repeated and negative so far.    He underwent appendectomy on 08/31/23 by DR. Lovick.   Fistulogram by Charlotte Cookey 09/07/23 unsuccesful use of fistula now temp cath dependent placed by IR.  Note from 09/15/23  " ID consulted for persistent leukocytosis and fever ( low grade on 5/19) despite IV antibiotics. A rpt CT abd and pelvis without contrast showed large  right lower quadrant complex fluid collection. General surgery suggests the complex fluid collection is probably a hematoma rather than abscess. The JP drain is not draining much, will probably come out in the next few days.  - ID recommends to hold off on IV antibiotics and observe. "  Plan for [facility] placement today by DR. Susi Eric NPO with plans for his wife to sign the consent this am.  Rocky Cipro PA-C

## 2023-09-16 NOTE — Progress Notes (Signed)
 Patient transferred to 4E Bed 17 accompanied by RN and respiratory therapist.

## 2023-09-16 NOTE — Progress Notes (Signed)
 [facility] NEPHROLOGY PROGRESS NOTE  Assessment/ Plan: Pt is a 78 y.o. yo male with CAD status post CABG, underwent laparoscopic appendectomy on 5/7 developed postop ileus, AKI on CKD.  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  UA with pyuria and kidney ultrasound without hydronephrosis. No sign of renal recovery with worsening azotemia. First HD on 5/14, second HD on 5/15.  Received third HD 5/17 with 1.5 L UF, tolerated well.  Continues on TTS schedule. Renal navigator is aware of arranging outpatient HD as he gets closer to discharge.  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  On 5/14, the AV fistula is unable to cannulate even with small needle 17G.  Using temp cath with plan for conversion to [facility] today with VVS, 5/22. VVS to follow as an outpatient regarding his AV fistula   # Appendicitis with possible abscess and postop ileus: s/p  antibiotics and drain per surgical team.  TPN for the nutrition.  Antibiotics held, continued clinical observation.  Surgery following.  # Acute blood loss anemia, CT angio negative for bleeding.  Transfuse as needed.  StartESA 5/22.  No IV iron until infectious issues clearly resolved.  # Metabolic acidosis: dc sodium bicarbonate , HD now.  # CKD-MBD, hyperphosphatemia: Phosphorus stable and with poor p.o. is off sevelamer , PTH level 337, continue to monitor.  # Hypokalemia: Managed with HD, monitor lab.   Subjective:  Seen and examined.  Wife joined by phone, updated Plan for [facility] today with VVS, HD as well Having flatus, tolerating some liquids p.o. but is currently n.p.o.  Objective Vital signs in last 24 hours: Vitals:   09/15/23 2341 09/16/23 0419 09/16/23 0702 09/16/23 0739  BP: (!) 142/70 119/73   (!) 144/67  Pulse:  (!) 109  (!) 108  Resp: 20 20  (!) 28  Temp: 98.3 F (36.8 C) 97.9 F (36.6 C)  98.1 F (36.7 C)  TempSrc: Oral Oral  Oral  SpO2: 100% 97%  100%  Weight:   86.3 kg   Height:       Weight change:   Intake/Output Summary (Last 24 hours) at 09/16/2023 1103 Last data filed at 09/16/2023 0539 Gross per 24 hour  Intake 307.11 ml  Output 710 ml  Net -402.89 ml     Labs: RENAL PANEL Recent Labs  Lab 09/10/23 1050 09/11/23 0546 09/12/23 0746 09/13/23 0559 09/14/23 0646 09/15/23 0920 09/16/23 0435  NA 139 138 137 140 144 140 142  K 3.1* 3.2* 3.4* 3.4* 3.8 3.9 3.9  CL 94* 90* 95* 99 102 103 104  CO2 29 29 29 28 26 26 23   GLUCOSE 195* 239* 245* 209* 200* 204* 167*  BUN 67* 91* 53* 74* 91* 63* 83*  CREATININE 5.32* 5.86* 3.78* 4.59* 4.93* 3.54* 4.04*  CALCIUM  8.4* 8.4* 8.4* 8.5* 8.7* 8.7* 9.1  MG  --  2.2 1.8 2.0  --  1.6* 1.7  PHOS 5.1* 3.9 1.8* 2.2* 3.5 3.4 3.5  ALBUMIN  1.7*  --   --  1.7*  --  1.8* 1.8*    Liver Function Tests: Recent Labs  Lab 09/13/23 0559 09/15/23 0920 09/16/23 0435  AST 70*  --  102*  ALT 45*  --  105*  ALKPHOS 83  --  97  BILITOT 1.7*  --  0.8  PROT 5.9*  --  6.3*  ALBUMIN  1.7* 1.8* 1.8*   No results for input(s): "LIPASE", "AMYLASE" in the last 168 hours.  No results for input(s): "AMMONIA" in the last 168 hours. CBC: Recent Labs    01/18/23 1118 04/06/23 0910 09/11/23 0546 09/11/23 2140 09/13/23 0559 09/14/23 0646 09/16/23 0435  HGB  --    < > 8.4* 7.5* 8.7* 8.8* 8.5*  MCV  --    < > 100.8* 101.3* 100.0 99.6 102.3*  VITAMINB12 565  --   --   --   --   --   --    < > = values in this interval not displayed.    Cardiac Enzymes: No results for input(s): "CKTOTAL", "CKMB", "CKMBINDEX", "TROPONINI" in the last 168 hours. CBG: Recent Labs  Lab 09/15/23 1623 09/15/23 2005 09/15/23 2342 09/16/23 0421 09/16/23 0911  GLUCAP 166* 167* 174* 163* 152*    Iron Studies: No results for input(s): "IRON", "TIBC",  "TRANSFERRIN", "FERRITIN" in the last 72 hours. Studies/Results: NM Pulmonary Perfusion Result Date: 09/15/2023 CLINICAL DATA:  Dyspnea EXAM: NUCLEAR MEDICINE PERFUSION LUNG SCAN TECHNIQUE: Perfusion images were obtained in multiple projections after intravenous injection of radiopharmaceutical. Ventilation scans intentionally deferred if perfusion scan and chest x-ray adequate for interpretation during COVID 19 epidemic. RADIOPHARMACEUTICALS:  4.2 mCi Tc-36m MAA IV COMPARISON:  None Available. FINDINGS: There are small subsegmental defects within the posterior basal left lower lobe which are not segmental in nature and likely correspond to areas of rounded atelectasis noted on CT examination of 12/12/2020. Tiny defect within the lateral right mid lung zone is not segmental and may relate to fluid within the fissure. No segmental perfusion defects are identified. IMPRESSION: No evidence of pulmonary embolism Electronically Signed   By: Worthy Heads M.D.   On: 09/15/2023 19:40   DG CHEST PORT 1 VIEW Result Date: 09/15/2023 CLINICAL DATA:  Dyspnea. EXAM: PORTABLE CHEST 1 VIEW COMPARISON:  Sep 09, 2023. FINDINGS: Stable cardiomediastinal silhouette. Status post coronary bypass graft. Nasogastric tube is seen entering stomach. Right internal jugular catheter is unchanged. Bibasilar atelectasis is noted with small pleural effusions. IMPRESSION: Mild bibasilar atelectasis with small pleural effusions. Electronically Signed   By: Rosalene Colon M.D.   On: 09/15/2023 14:10     Medications: Infusions:  sodium chloride  10 mL/hr at 09/16/23 0945   TPN ADULT (ION) 70 mL/hr at 09/15/23 2200   TPN ADULT (ION)      Scheduled Medications:  chlorhexidine        Chlorhexidine  Gluconate Cloth  6 each Topical Q0600   Chlorhexidine  Gluconate Cloth  6 each Topical Q0600   fluticasone  furoate-vilanterol  1 puff Inhalation Daily   heparin  injection (subcutaneous)  5,000 Units Subcutaneous Q8H   insulin  aspart  0-20  Units Subcutaneous Q4H   metoprolol  tartrate  10 mg Intravenous Q6H   sodium chloride  flush  3 mL Intravenous Q12H    have reviewed scheduled and prn medications.  Physical Exam: General:NAD, he has NG tube. Heart:RRR, s1s2 nl Lungs:clear b/l, no crackle Abdomen:soft, drain present Extremities:No edema Neurology: Alert, awake and following commands Vascular Access: AV fistula is quite deep, thrill positive. Temp HD cath.  Kaitlyne Friedhoff B Hussam Muniz 09/16/2023,11:03 AM  LOS: 16 days

## 2023-09-16 NOTE — Progress Notes (Signed)
 Patient's spouse Clerence Gubser called and made aware of patient status and transfer.

## 2023-09-16 NOTE — Plan of Care (Signed)
  Problem: Education: Goal: Ability to describe self-care measures that may prevent or decrease complications (Diabetes Survival Skills Education) will improve Outcome: Progressing Goal: Individualized Educational Video(s) Outcome: Progressing   Problem: Coping: Goal: Ability to adjust to condition or change in health will improve Outcome: Progressing   Problem: Fluid Volume: Goal: Ability to maintain a balanced intake and output will improve Outcome: Progressing   Problem: Health Behavior/Discharge Planning: Goal: Ability to identify and utilize available resources and services will improve Outcome: Progressing Goal: Ability to manage health-related needs will improve Outcome: Progressing   Problem: Metabolic: Goal: Ability to maintain appropriate glucose levels will improve Outcome: Progressing   Problem: Nutritional: Goal: Maintenance of adequate nutrition will improve Outcome: Progressing Goal: Progress toward achieving an optimal weight will improve Outcome: Progressing   Problem: Skin Integrity: Goal: Risk for impaired skin integrity will decrease Outcome: Progressing   Problem: Tissue Perfusion: Goal: Adequacy of tissue perfusion will improve Outcome: Progressing   Problem: Education: Goal: Knowledge of General Education information will improve Description: Including pain rating scale, medication(s)/side effects and non-pharmacologic comfort measures Outcome: Progressing   Problem: Health Behavior/Discharge Planning: Goal: Ability to manage health-related needs will improve Outcome: Progressing   Problem: Clinical Measurements: Goal: Ability to maintain clinical measurements within normal limits will improve Outcome: Progressing Goal: Will remain free from infection Outcome: Progressing Goal: Diagnostic test results will improve Outcome: Progressing Goal: Respiratory complications will improve Outcome: Progressing Goal: Cardiovascular complication will  be avoided Outcome: Progressing   Problem: Activity: Goal: Risk for activity intolerance will decrease Outcome: Progressing   Problem: Nutrition: Goal: Adequate nutrition will be maintained Outcome: Progressing   Problem: Coping: Goal: Level of anxiety will decrease Outcome: Progressing   Problem: Elimination: Goal: Will not experience complications related to bowel motility Outcome: Progressing Goal: Will not experience complications related to urinary retention Outcome: Progressing   Problem: Pain Managment: Goal: General experience of comfort will improve and/or be controlled Outcome: Progressing   Problem: Safety: Goal: Ability to remain free from injury will improve Outcome: Progressing   Problem: Skin Integrity: Goal: Risk for impaired skin integrity will decrease Outcome: Progressing   Problem: Education: Goal: Knowledge of disease and its progression will improve Outcome: Progressing Goal: Individualized Educational Video(s) Outcome: Progressing   Problem: Fluid Volume: Goal: Compliance with measures to maintain balanced fluid volume will improve Outcome: Progressing   Problem: Health Behavior/Discharge Planning: Goal: Ability to manage health-related needs will improve Outcome: Progressing   Problem: Nutritional: Goal: Ability to make healthy dietary choices will improve Outcome: Progressing   Problem: Clinical Measurements: Goal: Complications related to the disease process, condition or treatment will be avoided or minimized Outcome: Progressing   Problem: Education: Goal: Understanding of CV disease, CV risk reduction, and recovery process will improve Outcome: Progressing Goal: Individualized Educational Video(s) Outcome: Progressing   Problem: Activity: Goal: Ability to return to baseline activity level will improve Outcome: Progressing   Problem: Cardiovascular: Goal: Ability to achieve and maintain adequate cardiovascular perfusion  will improve Outcome: Progressing Goal: Vascular access site(s) Level 0-1 will be maintained Outcome: Progressing   Problem: Health Behavior/Discharge Planning: Goal: Ability to safely manage health-related needs after discharge will improve Outcome: Progressing

## 2023-09-17 ENCOUNTER — Inpatient Hospital Stay (HOSPITAL_COMMUNITY)

## 2023-09-17 ENCOUNTER — Encounter (HOSPITAL_COMMUNITY): Payer: Self-pay | Admitting: Vascular Surgery

## 2023-09-17 DIAGNOSIS — I4892 Unspecified atrial flutter: Secondary | ICD-10-CM | POA: Insufficient documentation

## 2023-09-17 DIAGNOSIS — K567 Ileus, unspecified: Secondary | ICD-10-CM | POA: Diagnosis not present

## 2023-09-17 DIAGNOSIS — N184 Chronic kidney disease, stage 4 (severe): Secondary | ICD-10-CM | POA: Diagnosis not present

## 2023-09-17 DIAGNOSIS — K37 Unspecified appendicitis: Secondary | ICD-10-CM | POA: Diagnosis not present

## 2023-09-17 DIAGNOSIS — E119 Type 2 diabetes mellitus without complications: Secondary | ICD-10-CM

## 2023-09-17 DIAGNOSIS — K3532 Acute appendicitis with perforation and localized peritonitis, without abscess: Secondary | ICD-10-CM

## 2023-09-17 DIAGNOSIS — J9600 Acute respiratory failure, unspecified whether with hypoxia or hypercapnia: Secondary | ICD-10-CM | POA: Diagnosis not present

## 2023-09-17 DIAGNOSIS — N179 Acute kidney failure, unspecified: Secondary | ICD-10-CM | POA: Diagnosis not present

## 2023-09-17 DIAGNOSIS — A419 Sepsis, unspecified organism: Secondary | ICD-10-CM | POA: Diagnosis not present

## 2023-09-17 DIAGNOSIS — D62 Acute posthemorrhagic anemia: Secondary | ICD-10-CM | POA: Diagnosis not present

## 2023-09-17 LAB — BASIC METABOLIC PANEL WITH GFR
Anion gap: 17 — ABNORMAL HIGH (ref 5–15)
BUN: 92 mg/dL — ABNORMAL HIGH (ref 8–23)
CO2: 22 mmol/L (ref 22–32)
Calcium: 9.3 mg/dL (ref 8.9–10.3)
Chloride: 104 mmol/L (ref 98–111)
Creatinine, Ser: 4.18 mg/dL — ABNORMAL HIGH (ref 0.61–1.24)
GFR, Estimated: 14 mL/min — ABNORMAL LOW (ref >60)
Glucose, Bld: 202 mg/dL — ABNORMAL HIGH (ref 70–99)
Potassium: 4.5 mmol/L (ref 3.5–5.1)
Sodium: 143 mmol/L (ref 135–145)

## 2023-09-17 LAB — ECHOCARDIOGRAM COMPLETE
AR max vel: 1.93 cm2
AV Peak grad: 10.5 mmHg
Ao pk vel: 1.62 m/s
Area-P 1/2: 3.72 cm2
Height: 69 in
MV VTI: 3.09 cm2
S' Lateral: 2.8 cm
Weight: 3160.51 [oz_av]

## 2023-09-17 LAB — CBC
HCT: 27.2 % — ABNORMAL LOW (ref 39.0–52.0)
Hemoglobin: 8.6 g/dL — ABNORMAL LOW (ref 13.0–17.0)
MCH: 32.7 pg (ref 26.0–34.0)
MCHC: 31.6 g/dL (ref 30.0–36.0)
MCV: 103.4 fL — ABNORMAL HIGH (ref 80.0–100.0)
Platelets: 317 10*3/uL (ref 150–400)
RBC: 2.63 MIL/uL — ABNORMAL LOW (ref 4.22–5.81)
RDW: 16.2 % — ABNORMAL HIGH (ref 11.5–15.5)
WBC: 13.4 10*3/uL — ABNORMAL HIGH (ref 4.0–10.5)
nRBC: 0 % (ref 0.0–0.2)

## 2023-09-17 LAB — GLUCOSE, CAPILLARY
Glucose-Capillary: 116 mg/dL — ABNORMAL HIGH (ref 70–99)
Glucose-Capillary: 116 mg/dL — ABNORMAL HIGH (ref 70–99)
Glucose-Capillary: 138 mg/dL — ABNORMAL HIGH (ref 70–99)
Glucose-Capillary: 164 mg/dL — ABNORMAL HIGH (ref 70–99)
Glucose-Capillary: 168 mg/dL — ABNORMAL HIGH (ref 70–99)
Glucose-Capillary: 175 mg/dL — ABNORMAL HIGH (ref 70–99)
Glucose-Capillary: 90 mg/dL (ref 70–99)

## 2023-09-17 LAB — POCT I-STAT 7, (LYTES, BLD GAS, ICA,H+H)
Acid-base deficit: 1 mmol/L (ref 0.0–2.0)
Bicarbonate: 23.1 mmol/L (ref 20.0–28.0)
Calcium, Ion: 1.17 mmol/L (ref 1.15–1.40)
HCT: 23 % — ABNORMAL LOW (ref 39.0–52.0)
Hemoglobin: 7.8 g/dL — ABNORMAL LOW (ref 13.0–17.0)
O2 Saturation: 100 %
Patient temperature: 98.6
Potassium: 4.7 mmol/L (ref 3.5–5.1)
Sodium: 143 mmol/L (ref 135–145)
TCO2: 24 mmol/L (ref 22–32)
pCO2 arterial: 35.1 mmHg (ref 32–48)
pH, Arterial: 7.426 (ref 7.35–7.45)
pO2, Arterial: 447 mmHg — ABNORMAL HIGH (ref 83–108)

## 2023-09-17 LAB — PHOSPHORUS: Phosphorus: 4.3 mg/dL (ref 2.5–4.6)

## 2023-09-17 LAB — HEPATIC FUNCTION PANEL
ALT: 73 U/L — ABNORMAL HIGH (ref 0–44)
AST: 70 U/L — ABNORMAL HIGH (ref 15–41)
Albumin: 2.4 g/dL — ABNORMAL LOW (ref 3.5–5.0)
Alkaline Phosphatase: 108 U/L (ref 38–126)
Bilirubin, Direct: 0.2 mg/dL (ref 0.0–0.2)
Indirect Bilirubin: 0.4 mg/dL (ref 0.3–0.9)
Total Bilirubin: 0.6 mg/dL (ref 0.0–1.2)
Total Protein: 7.2 g/dL (ref 6.5–8.1)

## 2023-09-17 LAB — TSH: TSH: 1.378 u[IU]/mL (ref 0.350–4.500)

## 2023-09-17 LAB — MAGNESIUM: Magnesium: 2.3 mg/dL (ref 1.7–2.4)

## 2023-09-17 MED ORDER — ACETAMINOPHEN 325 MG PO TABS
650.0000 mg | ORAL_TABLET | Freq: Four times a day (QID) | ORAL | Status: DC | PRN
  Administered 2023-09-18 – 2023-09-25 (×6): 650 mg via ORAL
  Filled 2023-09-17 (×6): qty 2

## 2023-09-17 MED ORDER — PERFLUTREN LIPID MICROSPHERE
1.0000 mL | INTRAVENOUS | Status: AC | PRN
  Administered 2023-09-17: 2 mL via INTRAVENOUS

## 2023-09-17 MED ORDER — ALBUMIN HUMAN 25 % IV SOLN
25.0000 g | INTRAVENOUS | Status: AC
  Administered 2023-09-17: 25 g via INTRAVENOUS
  Filled 2023-09-17: qty 100

## 2023-09-17 MED ORDER — FUROSEMIDE 10 MG/ML IJ SOLN
160.0000 mg | Freq: Once | INTRAVENOUS | Status: AC
  Administered 2023-09-17: 160 mg via INTRAVENOUS
  Filled 2023-09-17: qty 10

## 2023-09-17 MED ORDER — CHLORHEXIDINE GLUCONATE CLOTH 2 % EX PADS
6.0000 | MEDICATED_PAD | Freq: Every day | CUTANEOUS | Status: DC
  Administered 2023-09-17 – 2023-10-01 (×15): 6 via TOPICAL

## 2023-09-17 MED ORDER — BOOST / RESOURCE BREEZE PO LIQD CUSTOM
1.0000 | Freq: Three times a day (TID) | ORAL | Status: DC
  Administered 2023-09-17 – 2023-09-20 (×5): 1 via ORAL

## 2023-09-17 MED ORDER — AMIODARONE LOAD VIA INFUSION
150.0000 mg | INTRAVENOUS | Status: AC
  Administered 2023-09-17: 150 mg via INTRAVENOUS
  Filled 2023-09-17: qty 83.34

## 2023-09-17 MED ORDER — AMIODARONE HCL IN DEXTROSE 360-4.14 MG/200ML-% IV SOLN
60.0000 mg/h | INTRAVENOUS | Status: DC
  Administered 2023-09-17 (×2): 60 mg/h via INTRAVENOUS
  Filled 2023-09-17 (×2): qty 200

## 2023-09-17 MED ORDER — AMIODARONE HCL IN DEXTROSE 360-4.14 MG/200ML-% IV SOLN
30.0000 mg/h | INTRAVENOUS | Status: DC
  Administered 2023-09-17 – 2023-09-18 (×2): 30 mg/h via INTRAVENOUS
  Filled 2023-09-17 (×2): qty 200

## 2023-09-17 MED ORDER — HYDROMORPHONE HCL 1 MG/ML IJ SOLN
1.0000 mg | INTRAMUSCULAR | Status: DC | PRN
  Administered 2023-09-17: 0.5 mg via INTRAVENOUS
  Administered 2023-09-17 – 2023-09-19 (×4): 1 mg via INTRAVENOUS
  Filled 2023-09-17 (×3): qty 1

## 2023-09-17 NOTE — Progress Notes (Signed)
 [facility] PROGRESS NOTE    Progress Note  Christian [name]  ZOX:[id number] DOB: 02-13-1946 DOA: 08/30/2023 PCP: [name], MD     Brief Narrative:   Christian [name] is an 78 y.o. male past medical history of CABG postop CVA, chronic kidney disease stage IV who underwent laparoscopic appendectomy by general surgery on 08/31/2023 developed a postoperative ileus, JP drain in place, and NG tube to suction developed acute kidney injury, nephrology was consulted and is undergoing hemodialysis, we were unable to use his old AV fistula so he underwent HD catheter placement on the right by IR on 09/08/2022, CT scan of the abdomen pelvis on 09/08/2022 showed GB drainage with hematoma and persistent postop ileus.  NG tube has since been removed.  A  Assessment/Plan:   Acute perforated Appendicitis abscess, status post surgical intervention and now has developed a postop ileus: Previous 14-day course of IV Zosyn  blood cultures remain negative. JP drain with minimal serosanguineous output. Improve, general surgery recommended TPN started on 09/08/2022 and stopped on 09/16/2023 as he had no abscess. Out of bed to chair, working with physical therapy. Repeated CT scan on 09/08/2022 showed complex 5 x 11 x 4 complex fluid collection. ID was consulted due to persistent fever and leukocytosis on 09/13/2023 despite IV antibiotics General Surgery is more concerned about a hematoma rather than abscess. ID recommended to hold on antibiotics.  She received 1 dose of IV Ancef  on 09/16/2023 for surgical temporary dialysis catheter placement.  Acute renal failure superimposed on stage 4 chronic kidney disease (HCC) With a baseline creatinine around 3.2-3.5. Probably developed ATN in the setting of hypotension and blood loss, nephrology was consulted HD catheter was placed now on dialysis. Vascular surgery was consulted fistulogram performed 09/07/2023 with balloon venoplasty to the left  fistula. Underwent right IJ temporary catheter placement by IR on 09/08/2022.  Removed on 09/16/2023. Vascular recommended tunneled dialysis catheter placement on 09/16/2023. Continue dialysis per nephrology. No signs of renal recovery. Ended up becoming hypotensive overnight received bolus of normal saline, nephrology will try a dose of IV Lasix  today as chest x-ray shows slight pulmonary edema. The plan for HD on 09/18/2023  Acute blood loss anemia: Hemoglobin around 10 there was a drop to 7.5, transfuse 1 unit packed red blood cells his hemoglobin has remained relatively stable. CBC this morning is pending.  Last hemoglobin was 8.6.  A-fib with RVR: Started on IV diltiazem  became hypotensive had to be bolused with amiodarone  now on amiodarone  drip. Chads Vascor greater than 3 Converted to sinus rhythm. Consult cardiology not a candidate for anticoagulation at this time. Will need to discuss with surgery to see when we can start anticoagulation.  Essential hypertension: Currently on IV metoprolol  every 6 hours, and hydralazine  10 every 6 hours as needed.  Diabetes mellitus type 2: TPN had held as right IJ was discontinued. Now on sliding scale in the blood glucose fairly controlled. Once we start TPN can start giving him insulin  through his TPN.  CAD: Evaluated by cardiology no further ischemic evaluation.  Thrombocytosis: Likely reactive.  Electrolyte imbalance hypokalemia/hypomagnesemia: Continue to correct during HD and TPN. Potassium is 4.5, phosphorus 4.3, magnesium  2.3  Panic attack: He required 2 to 3 L of oxygen. VQ scan showed very low probability.   Severe protein caloric malnutrition: Start TPN once central line catheter was placed by IR. IR consulted for left central line catheter for TPN.  Back pain: Increase Dilaudid .   DVT prophylaxis: heparin  Family Communication:none  Status is: Inpatient Remains inpatient appropriate because: Acute appendicitis now  with acute kidney injury    Code Status:     Code Status Orders  (From admission, onward)           Start     Ordered   08/31/23 0416  Full code  Continuous       Question:  By:  Answer:  Consent: discussion documented in EHR   08/31/23 0416           Code Status History     Date Active Date Inactive Code Status Order ID Comments User Context   12/12/2020 0212 12/16/2020 1900 Full Code [id number]  Dea Evert, DO ED   10/03/2020 1828 10/07/2020 1752 Full Code [id number]  Ozell Blunt, MD Inpatient   08/09/2019 1913 10/18/2019 1749 Full Code [id number]  Knox Perl, MD ED   09/27/2014 1331 10/01/2014 2151 Full Code [id number]  Annamarie Kid, MD Inpatient   02/27/2014 1233 02/28/2014 1432 Full Code [id number]  Knox Perl, MD Inpatient         IV Access:   Peripheral IV   Procedures and diagnostic studies:   DG Chest Port 1 View Result Date: 09/17/2023 CLINICAL DATA:  Acute respiratory failure EXAM: PORTABLE CHEST 1 VIEW COMPARISON:  Film from the previous day. FINDINGS: Cardiac shadow is stable. Dialysis catheter is again seen. Postsurgical changes are noted. Small pleural effusions are again seen but less prominent likely related to positioning. No focal confluent infiltrate is seen. IMPRESSION: Small effusions bilaterally. Electronically Signed   By: Violeta Grey M.D.   On: 09/17/2023 02:19   DG Chest Port 1V same Day Result Date: 09/16/2023 CLINICAL DATA:  Postop respiratory distress EXAM: PORTABLE CHEST 1 VIEW COMPARISON:  09/15/2023, 09/09/2023 FINDINGS: Right-sided central venous catheter tip at the right atrium. Sternotomy changes. Cardiomegaly with bilateral pleural effusions. Progressive airspace disease at the left base. Hazy bilateral opacity could be due to mild edema or pleural fluid. No pneumothorax IMPRESSION: Cardiomegaly with bilateral pleural effusions. Progressive airspace disease at the left base which may be due to atelectasis or pneumonia. Hazy bilateral  opacity could be due to mild edema or pleural fluid. Electronically Signed   By: Esmeralda Hedge M.D.   On: 09/16/2023 16:57   US  EKG SITE RITE Result Date: 09/16/2023 If [facility] image not attached, placement could not be confirmed due to current cardiac rhythm.  HYBRID OR IMAGING ([facility] ONLY) Result Date: 09/16/2023 There is no interpretation for this exam.  This order is for images obtained during a surgical procedure.  Please See "Surgeries" Tab for more information regarding the procedure.   NM Pulmonary Perfusion Result Date: 09/15/2023 CLINICAL DATA:  Dyspnea EXAM: NUCLEAR MEDICINE PERFUSION LUNG SCAN TECHNIQUE: Perfusion images were obtained in multiple projections after intravenous injection of radiopharmaceutical. Ventilation scans intentionally deferred if perfusion scan and chest x-ray adequate for interpretation during COVID 19 epidemic. RADIOPHARMACEUTICALS:  4.2 mCi Tc-95m MAA IV COMPARISON:  None Available. FINDINGS: There are small subsegmental defects within the posterior basal left lower lobe which are not segmental in nature and likely correspond to areas of rounded atelectasis noted on CT examination of 12/12/2020. Tiny defect within the lateral right mid lung zone is not segmental and may relate to fluid within the fissure. No segmental perfusion defects are identified. IMPRESSION: No evidence of pulmonary embolism Electronically Signed   By: Worthy Heads M.D.   On: 09/15/2023 19:40   DG CHEST PORT 1 VIEW Result Date: 09/15/2023 CLINICAL  DATA:  Dyspnea. EXAM: PORTABLE CHEST 1 VIEW COMPARISON:  Sep 09, 2023. FINDINGS: Stable cardiomediastinal silhouette. Status post coronary bypass graft. Nasogastric tube is seen entering stomach. Right internal jugular catheter is unchanged. Bibasilar atelectasis is noted with small pleural effusions. IMPRESSION: Mild bibasilar atelectasis with small pleural effusions. Electronically Signed   By: Rosalene Colon M.D.   On: 09/15/2023 14:10      Medical Consultants:   None.   Subjective:    Christian [name] complaining of back pain.  Objective:    Vitals:   09/17/23 0545 09/17/23 0600 09/17/23 0610 09/17/23 0816  BP: (!) 91/51 (!) 87/52 (!) 104/54 118/62  Pulse: 97 (!) 104 97 99  Resp: (!) 24 (!) 26 (!) 24 (!) 27  Temp:   98.3 F (36.8 C)   TempSrc:   Axillary   SpO2: 100% 100% 100% 98%  Weight:      Height:       SpO2: 98 % O2 Flow Rate (L/min): 2.5 L/min FiO2 (%): 40 %   Intake/Output Summary (Last 24 hours) at 09/17/2023 0820 Last data filed at 09/17/2023 0610 Gross per 24 hour  Intake 1497.28 ml  Output 2395 ml  Net -897.72 ml   Filed Weights   09/16/23 0702 09/16/23 1203 09/17/23 0500  Weight: 86.3 kg 86.3 kg 89.6 kg    Exam: General exam: In no acute distress. Respiratory system: Good air movement and clear to auscultation. Cardiovascular system: S1 & S2 heard, RRR. No JVD. Gastrointestinal system: Abdomen is nondistended, soft and nontender.  Extremities: No pedal edema. Skin: No rashes, lesions or ulcers Psychiatry: Judgement and insight appear normal. Mood & affect appropriate.   Data Reviewed:    Labs: Basic Metabolic Panel: Recent Labs  Lab 09/13/23 0559 09/14/23 1610 09/15/23 0920 09/16/23 0435 09/16/23 1744 09/16/23 2249 09/17/23 0141 09/17/23 0232  NA 140 144 140 142 144 142 143 143  K 3.4* 3.8 3.9 3.9 4.6 4.5 4.7 4.5  CL 99 102 103 104 107 108  --  104  CO2 28 26 26 23 23 23   --  22  GLUCOSE 209* 200* 204* 167* 171* 180*  --  202*  BUN 74* 91* 63* 83* 90* 93*  --  92*  CREATININE 4.59* 4.93* 3.54* 4.04* 4.30* 4.28*  --  4.18*  CALCIUM  8.5* 8.7* 8.7* 9.1 9.0 9.1  --  9.3  MG 2.0  --  1.6* 1.7  --  2.2  --  2.3  PHOS 2.2* 3.5 3.4 3.5 4.7*  --   --  4.3   GFR Estimated Creatinine Clearance: 16.7 mL/min (A) (by C-G formula based on SCr of 4.18 mg/dL (H)). Liver Function Tests: Recent Labs  Lab 09/10/23 1050 09/13/23 0559 09/15/23 0920 09/16/23 0435  09/16/23 1744  AST  --  70*  --  102*  --   ALT  --  45*  --  105*  --   ALKPHOS  --  83  --  97  --   BILITOT  --  1.7*  --  0.8  --   PROT  --  5.9*  --  6.3*  --   ALBUMIN  1.7* 1.7* 1.8* 1.8* 2.1*   No results for input(s): "LIPASE", "AMYLASE" in the last 168 hours. No results for input(s): "AMMONIA" in the last 168 hours. Coagulation profile No results for input(s): "INR", "PROTIME" in the last 168 hours. COVID-19 Labs  No results for input(s): "DDIMER", "FERRITIN", "LDH", "CRP" in the last 72  hours.  Lab Results  Component Value Date   SARSCOV2NAA NEGATIVE 12/11/2020   SARSCOV2NAA NEGATIVE 10/03/2020   SARSCOV2NAA NEGATIVE 10/17/2019   SARSCOV2NAA NEGATIVE 10/13/2019    CBC: Recent Labs  Lab 09/10/23 1049 09/11/23 0546 09/11/23 2140 09/13/23 0559 09/14/23 0646 09/16/23 0435 09/16/23 2249 09/17/23 0141  WBC 13.8* 12.6* 11.9* 12.8* 13.2* 11.8* 13.4*  --   NEUTROABS 11.2* 10.3*  --  9.5*  --   --   --   --   HGB 8.5* 8.4* 7.5* 8.7* 8.8* 8.5* 8.6* 7.8*  HCT 25.7* 25.6* 23.5* 26.9* 27.3* 26.9* 27.2* 23.0*  MCV 100.0 100.8* 101.3* 100.0 99.6 102.3* 103.4*  --   PLT 411* 420* 379 375 371 322 317  --    Cardiac Enzymes: No results for input(s): "CKTOTAL", "CKMB", "CKMBINDEX", "TROPONINI" in the last 168 hours. BNP (last 3 results) No results for input(s): "PROBNP" in the last 8760 hours. CBG: Recent Labs  Lab 09/16/23 1559 09/16/23 1939 09/17/23 0129 09/17/23 0342 09/17/23 0751  GLUCAP 163* 176* 168* 138* 116*   D-Dimer: No results for input(s): "DDIMER" in the last 72 hours. Hgb A1c: No results for input(s): "HGBA1C" in the last 72 hours. Lipid Profile: No results for input(s): "CHOL", "HDL", "LDLCALC", "TRIG", "CHOLHDL", "LDLDIRECT" in the last 72 hours. Thyroid  function studies: Recent Labs    09/17/23 0232  TSH 1.378   Anemia work up: No results for input(s): "VITAMINB12", "FOLATE", "FERRITIN", "TIBC", "IRON", "RETICCTPCT" in the last 72  hours. Sepsis Labs: Recent Labs  Lab 09/13/23 0559 09/14/23 0646 09/16/23 0435 09/16/23 2249  WBC 12.8* 13.2* 11.8* 13.4*   Microbiology Recent Results (from the past 240 hours)  Culture, blood (Routine X 2) w Reflex to ID Panel     Status: None (Preliminary result)   Collection Time: 09/13/23 10:11 AM   Specimen: BLOOD RIGHT HAND  Result Value Ref Range Status   Specimen Description BLOOD RIGHT HAND  Final   Special Requests   Final    BOTTLES DRAWN AEROBIC AND ANAEROBIC Blood Culture adequate volume   Culture   Final    NO GROWTH 4 DAYS Performed at [facility], [address] [place], [place] [address]    Report Status PENDING  Incomplete  Culture, blood (Routine X 2) w Reflex to ID Panel     Status: None (Preliminary result)   Collection Time: 09/13/23 10:11 AM   Specimen: BLOOD RIGHT HAND  Result Value Ref Range Status   Specimen Description BLOOD RIGHT HAND  Final   Special Requests   Final    BOTTLES DRAWN AEROBIC AND ANAEROBIC Blood Culture adequate volume   Culture   Final    NO GROWTH 4 DAYS Performed at [facility], [address] [place], [place] [address]    Report Status PENDING  Incomplete     Medications:    Chlorhexidine  Gluconate Cloth  6 each Topical Q0600   darbepoetin (ARANESP ) injection - DIALYSIS  100 mcg Subcutaneous Q Thu-1800   fluticasone  furoate-vilanterol  1 puff Inhalation Daily   heparin  injection (subcutaneous)  5,000 Units Subcutaneous Q8H   insulin  aspart  0-20 Units Subcutaneous Q4H   sodium chloride  flush  3 mL Intravenous Q12H   Continuous Infusions:  amiodarone      dextrose  40 mL/hr at 09/17/23 0600   furosemide         LOS: 17 days   Macdonald Savoy  [facility]  09/17/2023, 8:20 AM

## 2023-09-17 NOTE — Progress Notes (Signed)
 Nutrition Follow-up  DOCUMENTATION CODES:   Not applicable  INTERVENTION:   Boost Breeze po TID, each supplement provides 250 kcal and 9 grams of protein.  NUTRITION DIAGNOSIS:   Inadequate oral intake related to inability to eat as evidenced by NPO status.  Ongoing, diet just advanced to clear liquids  GOAL:   Patient will meet greater than or equal to 90% of their needs  Progressing    MONITOR:   Skin, I & O's, Labs, Weight trends  REASON FOR ASSESSMENT:   Consult Diet education, Enteral/tube feeding initiation and management (change TF to Nepro)  ASSESSMENT:   78 y.o. male presented to the ED with RLQ abdominal pain x 3 days. PMH includes CHF, CAD s/p CABG, HTN, HLD,T2DM, dementia, G-tube and CKD IV. Pt admitted with appendicitis with possible abscess.  TPN was stopped d/t central access issues. Temporary line was removed in OR for tunneled HD cath. Patient does not have central access for TPN.   Diet advanced to clear liquids today. RD added Boost Breeze to maximize intake of protein and calories. RN reports plans to advance to soft diet if patient tolerates clear liquids today.   Patient continues on TTS HD schedule.  2 L removed with HD yesterday.  UOP 375 ml x 24 hours  Labs reviewed.  CBG: [id number]  Medications reviewed and include aranesp  weekly, novolog . IVF: D10 at 20 ml/h.   Admit weight 101.6 kg (5/6) Current weight 89.6 kg (5/23)  Diet Order:   Diet Order             Diet clear liquid Room service appropriate? Yes; Fluid consistency: Thin  Diet effective now                   EDUCATION NEEDS:   No education needs have been identified at this time  Skin:  Skin Assessment: Reviewed RN Assessment  Last BM:  5/23 type 6  Height:   Ht Readings from Last 1 Encounters:  09/16/23 5\' 9"  (1.753 m)    Weight:   Wt Readings from Last 1 Encounters:  09/17/23 89.6 kg    Ideal Body Weight:  72.7 kg  BMI:  Body mass index is  29.17 kg/m.  Estimated Nutritional Needs:   Kcal:  2150-2350  Protein:  115-135 grams  Fluid:  UOP + 1L   Barnet Boots RD, LDN, CNSC Contact via secure chat. If unavailable, use group chat "RD Inpatient."

## 2023-09-17 NOTE — Progress Notes (Signed)
 I assessed pt earlier after notification pt was in aflutter and cardizem  gtt ordered while pt was on 3W. Orders received for transfer to 4E due to need for HR control. After transferring to 4E, pt transitioned to amiodarone  and 1 liter bolus given for hypotension. PCCM consulted and pt will transfer to 2M11. Pt is lethargic on BIPAP but able to answer simple questions and follow commands. C/o SOB and some abd pain. Skin pink warm and dry. Bibasilar rales. No distress. Reportedly, pt is due for HD tonight.   0052- 98.7 ax, HR 152 aflutter, 105/33 (55), RR 24 on BIPAP 8/5 at 40% with sats 100%.

## 2023-09-17 NOTE — Consult Note (Signed)
 NAME:  Christian [name], MRN:  [id number], DOB:  06/11/1945, LOS: 17 ADMISSION DATE:  08/30/2023, CONSULTATION DATE:  09/17/2023 REFERRING MD:  Dr. 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   Post op course complicated with Periappendicular abscess, post op ileus and ATN worsening renal function requiring HD. On 5/22 he underwent a right internal jugular tunneled dialysis catheter. Tonight PCCM called for hypotension.  He apparently was in SVT and so Cardizem  drip was started on Diltiazem  drip which did not help his rate but low her BP with SBP in the 80's.  The also on BiPAP and lethargic.  Cardizem  stopped and amiodarone  ordered  to start after 1 liter LR bolus.  Pertinent  Medical History   has a past medical history of Anemia in chronic kidney disease (01/19/2020), Aortic atherosclerosis (03/27/2020), Atypical pneumonia (12/12/2020), Benign prostatic hyperplasia (09/27/2014), CAD (coronary artery disease) (07/30/2018), CHF (congestive heart failure), CKD (chronic kidney disease) stage 4, GFR 15-29 ml/min (03/27/2020), Diabetic polyneuropathy, Elevated liver enzymes (08/09/2019), Hiatal hernia, History of coronary artery stent placement, History of pulmonary embolism (08/09/2019), HTN (hypertension) (11/12/2009), Hyperlipidemia associated with type 2 diabetes mellitus (11/12/2009), Left leg weakness (11/23/2018), Mild dementia, 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  Significant Hospital Events: Including procedures, antibiotic start and stop dates in addition to other pertinent events   5/23 transfer to ICU  Interim  History / Subjective:  N/a  Objective    Blood pressure (!) 105/33, pulse (!) 160, temperature 98.7 F (37.1 C), temperature source Axillary, resp. rate (!) 27, height 5\' 9"  (1.753 m), weight 86.3 kg, SpO2 100%.    Vent Mode: PSV;CPAP FiO2 (%):  [40 %] 40 % PEEP:  [5 cmH20] 5 cmH20 Pressure Support:  [5 cmH20-8 cmH20] 8 cmH20   Intake/Output Summary (Last 24 hours) at 09/17/2023 0025 Last data filed at 09/16/2023 1948 Gross per 24 hour  Intake 411.95 ml  Output 675 ml  Net -263.05 ml   Filed Weights   09/14/23 1258 09/16/23 0702 09/16/23 1203  Weight: 85.1 kg 86.3 kg 86.3 kg    Examination: General: awake but lethargic, on BiPAP, No Acute Respiratory distress HENT: pupils reactive, no icterus, EOMI Lungs: CTA mildly course, no wheezes no rales Cardiovascular: irreg, tachy no murmurs Abdomen: soft , nt nd bs pos Extremities: minimal edema LE b/l, no cyanosis, clubbing or edema Neuro: Lethargic but arouseable, follows simple commands, generalized weakness   Resolved problem list   Assessment and Plan  Hypotension Secondary to Cardizem  and dehydration invascular dryness IV fluid bolus Acute respiratory failure On BiPAP Get ABG Atrial flutter with RVR Starting Amiodarone  May need IV Heparin -will see if he converts Sepsis Secondary to his appendix rupture- resolving Ileus Adjust electrolytes and IV fluids Acute on choric CKD s/p tunneled dialysis catheter Did not get dialyzed x 1 session Consider dialysis tomorrow depending on his BP S/p Laparoscopic appendectomy for perforated appendix Post op day # 15 JP drain out 5/21 Surgfery were encouraging OOB Appendical abscess Antibiotics per ID ABLA Monitor h/h HTN Currently hypotensive, hold  anti-hypertensives T2DM Finger sticks and sliding scale  Best Practice (right click and "Reselect all SmartList Selections" daily)   Diet/type: NPO  DVT prophylaxis prophylactic heparin   Pressure ulcer(s): N/A GI  prophylaxis: N/A Lines: N/A Foley:  Yes, and it is still needed Code Status:  full code  Labs   CBC: Recent Labs  Lab 09/10/23 1049 09/11/23 0546 09/11/23 2140 09/13/23 0559 09/14/23 0646 09/16/23 0435  WBC 13.8* 12.6* 11.9* 12.8* 13.2* 11.8*  NEUTROABS 11.2* 10.3*  --  9.5*  --   --   HGB 8.5* 8.4* 7.5* 8.7* 8.8* 8.5*  HCT 25.7* 25.6* 23.5* 26.9* 27.3* 26.9*  MCV 100.0 100.8* 101.3* 100.0 99.6 102.3*  PLT 411* 420* 379 375 371 322    Basic Metabolic Panel: Recent Labs  Lab 09/12/23 0746 09/13/23 0559 09/14/23 0646 09/15/23 0920 09/16/23 0435 09/16/23 1744 09/16/23 2249  NA 137 140 144 140 142 144 142  K 3.4* 3.4* 3.8 3.9 3.9 4.6 4.5  CL 95* 99 102 103 104 107 108  CO2 29 28 26 26 23 23 23   GLUCOSE 245* 209* 200* 204* 167* 171* 180*  BUN 53* 74* 91* 63* 83* 90* 93*  CREATININE 3.78* 4.59* 4.93* 3.54* 4.04* 4.30* 4.28*  CALCIUM  8.4* 8.5* 8.7* 8.7* 9.1 9.0 9.1  MG 1.8 2.0  --  1.6* 1.7  --  2.2  PHOS 1.8* 2.2* 3.5 3.4 3.5 4.7*  --    GFR: Estimated Creatinine Clearance: 16 mL/min (A) (by C-G formula based on SCr of 4.28 mg/dL (H)). Recent Labs  Lab 09/11/23 2140 09/13/23 0559 09/14/23 0646 09/16/23 0435  WBC 11.9* 12.8* 13.2* 11.8*    Liver Function Tests: Recent Labs  Lab 09/10/23 1050 09/13/23 0559 09/15/23 0920 09/16/23 0435 09/16/23 1744  AST  --  70*  --  102*  --   ALT  --  45*  --  105*  --   ALKPHOS  --  83  --  97  --   BILITOT  --  1.7*  --  0.8  --   PROT  --  5.9*  --  6.3*  --   ALBUMIN  1.7* 1.7* 1.8* 1.8* 2.1*   No results for input(s): "LIPASE", "AMYLASE" in the last 168 hours. No results for input(s): "AMMONIA" in the last 168 hours.  ABG    Component Value Date/Time   PHART 7.481 (H) 09/07/2019 0029   PCO2ART 31.4 (L) 09/07/2019 0029   PO2ART 125 (H) 09/07/2019 0029   HCO3 23.3 09/07/2019 0029   TCO2 27 08/12/2022 0715   ACIDBASEDEF 5.0 (H) 08/18/2019 2341   O2SAT 99.0 09/07/2019 0029     Coagulation Profile: No  results for input(s): "INR", "PROTIME" in the last 168 hours.  Cardiac Enzymes: No results for input(s): "CKTOTAL", "CKMB", "CKMBINDEX", "TROPONINI" in the last 168 hours.  HbA1C: Hgb A1c MFr Bld  Date/Time Value Ref Range Status  04/12/2023 11:22 AM 7.3 (H) 4.6 - 6.5 % Final    Comment:    Glycemic Control Guidelines for People with Diabetes:Non Diabetic:  <6%Goal of Therapy: <7%Additional Action Suggested:  >8%   01/18/2023 11:18 AM 9.5 (H) 4.6 - 6.5 % Final    Comment:    Glycemic Control Guidelines for People with Diabetes:Non Diabetic:  <6%Goal of Therapy: <7%Additional Action Suggested:  >8%     CBG: Recent Labs  Lab 09/16/23 0911 09/16/23 1149 09/16/23 1436 09/16/23 1559 09/16/23 1939  GLUCAP 152* 167* 156* 163* 176*    Review of Systems:   Lethargic not able to get full ROS  Past Medical History:  He,  has a past medical history of Anemia  in chronic kidney disease (01/19/2020), Aortic atherosclerosis (03/27/2020), Atypical pneumonia (12/12/2020), Benign prostatic hyperplasia (09/27/2014), CAD (coronary artery disease) (07/30/2018), CHF (congestive heart failure), CKD (chronic kidney disease) stage 4, GFR 15-29 ml/min (03/27/2020), Diabetic polyneuropathy, Elevated liver enzymes (08/09/2019), Hiatal hernia, History of coronary artery stent placement, History of pulmonary embolism (08/09/2019), HTN (hypertension) (11/12/2009), Hyperlipidemia associated with type 2 diabetes mellitus (11/12/2009), Left leg weakness (11/23/2018), Mild dementia, 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   Surgical History:   Past Surgical History:  Procedure Laterality Date   A/V FISTULAGRAM Left 08/12/2022   Procedure: A/V Fistulagram;  Surgeon: Kayla Part, MD;  [facility]: [facility] INVASIVE CV LAB;  Service: Cardiovascular;   Laterality: Left;   A/V FISTULAGRAM N/A 09/07/2023   Procedure: A/V Fistulagram;  Surgeon: Margherita Shell, MD;  Location: [facility] INVASIVE CV LAB;  Service: Cardiovascular;  Laterality: N/A;   APPLICATION OF WOUND VAC N/A 08/24/2019   Procedure: APPLICATION OF WOUND VAC;  Surgeon: Rudine Cos, MD;  Location: [facility] OR;  Service: Thoracic;  Laterality: N/A;   APPLICATION OF WOUND VAC  08/29/2019   Procedure: Wound Vac change;  Surgeon: Rudine Cos, MD;  Location: [facility] OR;  Service: Open Heart Surgery;;   APPLICATION OF WOUND VAC N/A 09/04/2019   Procedure: Application Of Wound Vac;  Surgeon: Norita Beauvais, MD;  Location: [facility] OR;  Service: Open Heart Surgery;  Laterality: N/A;   APPLICATION OF WOUND VAC N/A 09/06/2019   Procedure: APPLICATION OF ACELL, APPLICATION OF WOUND VAC USING PREVENA INCISIONAL  DRESSING;  Surgeon: Thornell Flirt, DO;  Location: [facility] OR;  Service: Plastics;  Laterality: N/A;   AV FISTULA PLACEMENT Left 09/18/2020   Procedure: ARTERIOVENOUS (AV) FISTULA CREATION LEFT VERSUS GRAFT;  Surgeon: Margherita Shell, MD;  Location: [facility] OR;  Service: Vascular;  Laterality: Left;   BASCILIC VEIN TRANSPOSITION Left 10/31/2020   Procedure: LEFT SECOND STAGE BASCILIC VEIN TRANSPOSITION;  Surgeon: Margherita Shell, MD;  Location: [facility] OR;  Service: Vascular;  Laterality: Left;   CARDIAC CATHETERIZATION     CORONARY ARTERY BYPASS GRAFT N/A 08/15/2019   Procedure: CORONARY ARTERY BYPASS GRAFTING (CABG), x 4 using bilateral IMAs and left radial artery .  LIMA TO LAD, RIMA TO PDA, RADIAL ARTERY TO CIRC AND SEQUENTIALLY TO OM1.;  Surgeon: Rudine Cos, MD;  Location: [facility] OR;  Service: Open Heart Surgery;  Laterality: N/A;   CORONARY STENT PLACEMENT  02/27/2014   distal rt/pd coronary       dr Berry [place]   CYSTO/ BLADDER BIOPSY'S/ CAUTHERIZATION  01-14-2004  DR Isla Mari   EXPLORATION POST OPERATIVE OPEN HEART N/A 08/16/2019   Procedure: Chest Closure S?P CABG WITH APPLICATION OF PREVENA   INCISIONAL WOUND VAC;  Surgeon: Rudine Cos, MD;  Location: [facility] OR;  Service: Open Heart Surgery;  Laterality: N/A;   EXPLORATION POST OPERATIVE OPEN HEART N/A 08/21/2019   Procedure: CHEST WASHOUT S/P OPEN CHEST;  Surgeon: Rudine Cos, MD;  Location: [facility] OR;  Service: Open Heart Surgery;  Laterality: N/A;  Open chest with Esmark dressing with Ioban sealant coverage.   EXPLORATION POST OPERATIVE OPEN HEART N/A 08/18/2019   Procedure: EXPLORATION POST OPERATIVE OPEN HEART (performed 04/23 on unit);  Surgeon: Rudine Cos, MD;  Location[facility] OR;  Service: Open Heart Surgery;  Laterality: N/A;   EXPLORATION POST OPERATIVE OPEN HEART N/A 08/24/2019   Procedure:  CHEST WASHOUT POST OPERATIVE OPEN HEART;  Surgeon: Rudine Cos, MD;  [facility]: [facility];  Service: Open Heart Surgery;  Laterality: N/A;   EXPLORATION POST OPERATIVE OPEN HEART N/A 08/29/2019   Procedure: CHEST WOUND WASHOUT POST OPERATIVE OPEN HEART;  Surgeon: Rudine Cos, MD;  [facility]: [facility];  Service: Open Heart Surgery;  Laterality: N/A;   EXPLORATION POST OPERATIVE OPEN HEART N/A 09/04/2019   Procedure: MEDIASTINAL EXPLORATION WITH STERNAL WOUND IRRIGATION;  Surgeon: Norita Beauvais, MD;  Location: [facility] OR;  Service: Open Heart Surgery;  Laterality: N/A;   EXPLORATION POST OPERATIVE OPEN HEART N/A 09/14/2019   Procedure: EVACUATION OF HEMATOMA;  Surgeon: Norita Beauvais, MD;  Location: [facility] OR;  Service: Open Heart Surgery;  Laterality: N/A;   IR FLUORO GUIDE CV LINE LEFT  09/19/2019   IR GASTROSTOMY TUBE MOD SED  10/04/2019   IR GASTROSTOMY TUBE REMOVAL  11/29/2019   IR PATIENT EVAL TECH 0-60 MINS  09/29/2019   IR US  GUIDE VASC ACCESS LEFT  09/19/2019   IR US  GUIDE VASC ACCESS RIGHT  09/08/2023   LAPAROSCOPIC APPENDECTOMY N/A 08/31/2023   Procedure: APPENDECTOMY, LAPAROSCOPIC;  Surgeon: Anda Bamberg, MD;  Location: [facility] OR;  Service: General;  Laterality: N/A;   LAPAROSCOPIC LYSIS OF ADHESIONS N/A 09/06/2019   Procedure:  LAPAROSCOPIC OMENTAL HARVEST;  Surgeon: Derral Flick, MD;  Location: [facility] OR;  Service: General;  Laterality: N/A;   LEFT HEART CATH AND CORONARY ANGIOGRAPHY N/A 08/10/2019   Procedure: LEFT HEART CATH AND CORONARY ANGIOGRAPHY;  Surgeon: Cody Das, MD;  [facility]: [facility] INVASIVE CV LAB;  Service: Cardiovascular;  Laterality: N/A;   LEFT HEART CATHETERIZATION WITH CORONARY ANGIOGRAM N/A 02/27/2014   Procedure: LEFT HEART CATHETERIZATION WITH CORONARY ANGIOGRAM;  Surgeon: Jessica Morn, MD;  Location: [facility] CATH LAB;  Service: Cardiovascular;  Laterality: N/A;   MEDIASTINAL EXPLORATION N/A 09/06/2019   Procedure: MEDIASTINAL EXPLORATION;  Surgeon: Norita Beauvais, MD;  Location: [facility] OR;  Service: Thoracic;  Laterality: N/A;   PECTORALIS FLAP  09/06/2019   Procedure: Pectoralis ADVANCEMENT Flap;  Surgeon: Thornell Flirt, DO;  Location: [facility] OR;  Service: Plastics;;   PERCUTANEOUS CORONARY STENT INTERVENTION (PCI-S)  02/27/2014   Procedure: PERCUTANEOUS CORONARY STENT INTERVENTION (PCI-S);  Surgeon: Jessica Morn, MD;  Location: [facility] CATH LAB;  Service: Cardiovascular;;  rt PDA  3.0/28mm Promus stent   PERIPHERAL VASCULAR INTERVENTION Left 08/12/2022   Procedure: PERIPHERAL VASCULAR INTERVENTION;  Surgeon: Kayla Part, MD;  Location: [facility] INVASIVE CV LAB;  Service: Cardiovascular;  Laterality: Left;   RADIAL ARTERY HARVEST Left 08/15/2019   Procedure: Radial Artery Harvest;  Surgeon: Rudine Cos, MD;  Location: [facility] OR;  Service: Open Heart Surgery;  Laterality: Left;   RIB PLATING N/A 09/06/2019   Procedure: STERNAL PLATING;  Surgeon: Norita Beauvais, MD;  Location: [facility] OR;  Service: Thoracic;  Laterality: N/A;   TEE WITHOUT CARDIOVERSION N/A 08/15/2019   Procedure: TRANSESOPHAGEAL ECHOCARDIOGRAM (TEE);  Surgeon: Rudine Cos, MD;  Location: [facility] OR;  Service: Open Heart Surgery;  Laterality: N/A;   TRACHEOSTOMY TUBE PLACEMENT  08/29/2019   Procedure: Tracheostomy;  Surgeon:  Rudine Cos, MD;  Location: [facility] OR;  Service: Open Heart Surgery; Decannulated 6/10/20211   TRANSURETHRAL RESECTION OF PROSTATE  04/04/2012   Procedure: TRANSURETHRAL RESECTION OF THE PROSTATE WITH GYRUS INSTRUMENTS;  Surgeon: Edmund Gouge, MD;  Location: [facility];  Service: Urology;  Laterality: N/A;   TRANSURETHRAL RESECTION OF PROSTATE  N/A 09/27/2014   Procedure: TRANSURETHRAL RESECTION OF THE PROSTATE ;  Surgeon: Annamarie Kid, MD;  Location: [facility] ORS;  Service: Urology;  Laterality: N/A;   TUNNELLED CATHETER EXCHANGE N/A 09/07/2023   Procedure: TUNNELLED CATHETER EXCHANGE;  Surgeon: Margherita Shell, MD;  Location: [facility] INVASIVE CV LAB;  Service: Cardiovascular;  Laterality: N/A;   UPPER GASTROINTESTINAL ENDOSCOPY       Social History:   reports that he quit smoking about 50 years ago. His smoking use included cigarettes. He started smoking about 70 years ago. He has a 10 pack-year smoking history. He has never used smokeless tobacco. He reports that he does not currently use alcohol. He reports that he does not use drugs.   Family History:  His family history includes Bone cancer in his brother; Diabetes in his brother, brother, father, and mother; Hypertension in his brother and mother.   Allergies No Known Allergies   Home Medications  Prior to Admission medications   Medication Sig Start Date End Date Taking? Authorizing Provider  acetaminophen  (TYLENOL ) 325 MG tablet Take 650 mg by mouth every 6 (six) hours as needed.   Yes [provider]  acetaminophen  (TYLENOL ) 500 MG tablet Take 2 tablets (1,000 mg total) by mouth 4 (four) times daily. 08/31/23 08/30/24 Yes Anda Bamberg, MD  albuterol  (VENTOLIN  HFA) 108 (90 Base) MCG/ACT inhaler TAKE 2 PUFFS BY MOUTH EVERY 6 HOURS AS NEEDED FOR WHEEZE OR SHORTNESS OF BREATH 04/22/21  Yes [name], MD  atorvastatin  (LIPITOR) 20 MG tablet Take 1 tablet (20 mg total) by mouth every  evening. Patient taking differently: Take 20 mg by mouth daily after supper. 08/24/23  Yes Knox Perl, MD  budesonide -formoterol  (SYMBICORT ) 160-4.5 MCG/ACT inhaler INHALE 2 PUFFS INTO THE LUNGS TWICE A DAY 12/09/22  Yes Wilfredo Hanly, MD  calcitRIOL (ROCALTROL) 0.25 MCG capsule Take 0.25 mcg by mouth 3 (three) times a week. Take one capsule at dinner after on Mon-Wed-Fri. 07/21/23  Yes [provider]  clonazePAM  (KLONOPIN ) 0.5 MG disintegrating tablet TAKE 1 TABLET BY MOUTH TWICE A DAY 03/15/23  Yes [name], MD  clopidogrel  (PLAVIX ) 75 MG tablet Take 1 tablet (75 mg total) by mouth daily. 06/24/23  Yes Knox Perl, MD  cromolyn  (OPTICROM ) 4 % ophthalmic solution Place 1 drop into both eyes every Wednesday. At bedtime 12/08/19  Yes [provider]  docusate sodium  (COLACE) 100 MG capsule Take 1 capsule (100 mg total) by mouth 2 (two) times daily. 08/31/23 08/30/24 Yes Anda Bamberg, MD  ferrous sulfate  325 (65 FE) MG tablet Take 325 mg by mouth daily with breakfast.   Yes [provider]  furosemide  (LASIX ) 80 MG tablet Take 120 mg by mouth 2 (two) times daily. Take one and one-half tablet in the morning at 0800 and 1400 daily 11/30/20  Yes [provider]  glimepiride  (AMARYL ) 2 MG tablet TAKE 1 TABLET BY MOUTH DAILY BEFORE BREAKFAST. 08/09/23  Yes [name], MD  hydrALAZINE  (APRESOLINE ) 100 MG tablet TAKE 1 TABLET BY MOUTH 3 TIMES DAILY. 10/12/22  Yes Knox Perl, MD  isosorbide  dinitrate (ISORDIL ) 30 MG tablet TAKE 1 TABLET BY MOUTH 3 TIMES DAILY. 05/03/23  Yes [name], MD  methocarbamol  (ROBAXIN -750) 750 MG tablet Take 1 tablet (750 mg total) by mouth 4 (four) times daily. 08/31/23  Yes Anda Bamberg, MD  metolazone  (ZAROXOLYN ) 5 MG tablet Take 5 mg by mouth 3 (three) times a week. Take one tablet by mouth  on Sun-Tues-Thurs before each dose of Furosemide .   Yes [provider]  metoprolol  succinate (TOPROL -XL) 50  MG 24 hr tablet Take 3 tablets (150 mg total) by mouth daily. Take with or immediately following a meal. 05/11/23  Yes Knox Perl, MD  Multiple Vitamin (MULTIVITAMIN) tablet Take 1 tablet by mouth daily.   Yes [provider]  nitroGLYCERIN  (NITROSTAT ) 0.4 MG SL tablet Place 0.4 mg under the tongue every 5 (five) minutes as needed for chest pain. 10/25/19  Yes [provider]  oxyCODONE  (ROXICODONE ) 5 MG immediate release tablet Take 1 tablet (5 mg total) by mouth every 4 (four) hours as needed. 08/31/23  Yes Anda Bamberg, MD  potassium chloride  (KLOR-CON ) 10 MEQ tablet Take 30 mEq by mouth daily. 10/25/20  Yes [provider]  PREVIDENT  5000 BOOSTER PLUS 1.1 % PSTE TAKE 1 APPLICATION BY MOUTH EVERY EVENING. 09/14/22  Yes [name], MD  senna-docusate (SENOKOT-S) 8.6-50 MG tablet Take 1 tablet by mouth at bedtime as needed for mild constipation.   Yes [provider]  sertraline  (ZOLOFT ) 25 MG tablet TAKE 1 TABLET BY MOUTH EVERY DAY 10/26/22  Yes [name], MD  silodosin (RAPAFLO) 8 MG CAPS capsule Take 1 capsule (8 mg total) by mouth at bedtime. 09/25/21  Yes McKenzie, Arden Beck, MD     Critical care time: 45   The patient is critically ill with multiple organ system failure and requires high complexity decision making for assessment and support, frequent evaluation and titration of therapies, advanced monitoring, review of radiographic studies and interpretation of complex data.   Critical Care Time devoted to patient care services, exclusive of separately billable procedures, described in this note is 35 minutes.   Claven Cumming, MD [facility] See Amion for pager  If no response to pager , please call [phone] until 7pm After 7:00 pm call Elink  [phone] 09/17/2023, 12:25 AM

## 2023-09-17 NOTE — Progress Notes (Signed)
   09/17/23 2015  BiPAP/CPAP/SIPAP  BiPAP/CPAP/SIPAP Pt Type Adult  BiPAP/CPAP/SIPAP SERVO  Reason BIPAP/CPAP not in use Non-compliant (Pt refused and is not in respiratory distress. BIPAP on standby if needed.)  Mask Type Full face mask  BiPAP/CPAP /SiPAP Vitals  Resp (!) 25  Bilateral Breath Sounds Clear;Diminished

## 2023-09-17 NOTE — Progress Notes (Signed)
 Patient Name: Christian [name] Date of Encounter: 09/17/2023 [facility] Cardiologist: Knox Perl, MD   Interval Summary  .    Was seen by cardiology on 08/31/23 for a preoperative evaluation for acute appendicitis. The risks were elevated but not prohibitive and the patient proceeded with laparoscopic appendectomy on 5/6 and had a JP drain placed. The patients hemoglobin dropped to 7.9 and he was transfused with 2 unit of RBC's on 5/9.  He had worsening renal function. On 09/08/23 and the patient started receiving dialysis. Repeat imaging showed a JP drain hematoma and a persistent post op ileus. Patient was placed on TPN on 5/14 and stopped on 5/22 after discovering the patient had no abscess. On 5/17 the hemoglobin dropped again and the patient received one unit of blood on 5/18. Late in the evening on 5/22 the patient developed atrial flutter with RVR. He was started on cardizem  and developed hypotension so this was stopped. Was then  started on IV amio bolus and drip. At that time he had worsening shortness of breath and was placed on BIPAP. Hemoglobin has dropped again to 7.8 today. Has since concerned to sinus tachycardia.  Vital Signs .    Vitals:   09/17/23 0800 09/17/23 0816 09/17/23 0900 09/17/23 1000  BP: 122/67 118/62 130/61 137/66  Pulse: 99 99 (!) 101 (!) 103  Resp: (!) 30 (!) 27 (!) 25 (!) 25  Temp:      TempSrc:      SpO2: 100% 98% 99% 96%  Weight:      Height:        Intake/Output Summary (Last 24 hours) at 09/17/2023 1127 Last data filed at 09/17/2023 1000 Gross per 24 hour  Intake 1806.65 ml  Output 2595 ml  Net -788.35 ml      09/17/2023    5:00 AM 09/16/2023   12:03 PM 09/16/2023    7:02 AM  Last 3 Weights  Weight (lbs) 197 lb 8.5 oz 190 lb 4.1 oz 190 lb 4.1 oz  Weight (kg) 89.6 kg 86.3 kg 86.3 kg      Telemetry/ECG    Converted into atrial flutter with RVR a 2:1 AV ratio, and rates in the 140's to 160's on 5/22 at 2123 converted back into what  appears to be sinus rhythm on 5/23 at 0405. On vital signs the patient had increased rates between 2 and 3 pm on 5/22 but was not on telemetry at this time.- Personally Reviewed  Physical Exam .   GEN: No acute distress.   Neck: No JVD Cardiac: regular rhythm tachycardic, no murmurs, rubs, or gallops.  Respiratory: Clear to auscultation bilaterally. GI: Soft, nontender, non-distended  MS: No edema  Assessment & Plan .     JAKWON GAYTON is a 78 y.o. male with a hx of CAD s/p PCI 2015, CABG 2021 complicated by cardiogenic shock, CVA and transient afib, HTN, HLD, DM, CKD, PVD,  who is being seen 08/31/2023 for preoperative evaluation at the request of Dr. Joan Mouton   New onset Atrial flutter with RVR (remote hx of atrial fib during prolonged CABG admission 2021) CHA2DS2-VASc Score = 8 [CHF History: 1, HTN History: 1, Diabetes History: 1, Stroke History: 2, Vascular Disease History: 1, Age Score: 2, Gender Score: 0].  Therefore, the patient's annual risk of stroke is 10.8 %.     --Atrial flutter with RVR and rates in the 140's to 160's on 5/22. At that time he had palpitations and worsening shortness of  breath. At about 4 AM the patient converted in what appears to be sinus rhythm with rates in the 100s.  On vital signs the patient had increased rates between 2 and 3 pm on 5/22 but was not on telemetry at this time this appeared to be shortly after having dialysis catheter placement. -- EKG shows sinus tachycardia (due to patient walking with PT) and a rate of 116. Suspect that tachycardia may be due to anemia and other physiologic stressors -- Continue IV amiodarone  until reliably taking oral medications, then anticipate can convert to oral form (perhaps short term rx) -> need to f/u LFTs -- Hold metoprolol  and continue to monitor BP. -- Echo pending -- Electrolytes per nephrology, TSH normal -- Suspect that atrial flutter is secondary to anemia, appendicitis s/p lap chole, suspected sepsis, and  recently starting Dialysis -- With recurrent anemia this hospitalization requiring multiple units of blood is currently a poor candidate for anticoagulation. Will confirm with MD.  Anemia requiring multiple units of blood Management per primary  CAD s/p PCI 2015, CABG on 07/2019 Denies chest pain Antiplatelets on hold given recent surgery, hematoma - was on Plavix  PTA  HTN Manage as above  Otherwise per primary  - Appendicitis s/p lap chole on 5/6  - ESRD on HD - Type 2 DM  For questions or updates, please contact [facility] Please consult [email or web address] for contact info under        Signed, Dorise Gangi, PA-C

## 2023-09-17 NOTE — Progress Notes (Signed)
 Received patient from 3W.  Patient placed on tele.  EKG obtained showing SVT.  HR 160.  Patient responds to voice and able to follow commands.  Patient on bipap.  Dr. Ulice Gamer notified of patients low BP.  Cardizem  d/c'd.  IV LR bolus started.  New orders given for amiodorone.  Critical care team at bedside.  Patient transferring to ICU

## 2023-09-17 NOTE — TOC Progression Note (Signed)
 Transition of Care [facility]) - Progression Note    Patient Details  Name: Christian [name] MRN: [id number] Date of Birth: 12/18/1945  [facility] CM/SW Contact  Juliane Och, LCSW Phone Number: 09/17/2023, 11:42 AM  Clinical Narrative:     11:42 AM CSW introduced self and role to patient and patient's spouse, Felipa Horsfall. Patient and Felipa Horsfall confirmed that they are still interested in patient discharging to [facility] once medically ready. CSW relayed continued interest to [facility] admissions. CSW to submit SNF insurance authorization closer to patient's discharge to SNF. CSW provided SNF and insurance coverage education to patient and Felipa Horsfall.  Expected Discharge Plan: Skilled Nursing Facility Barriers to Discharge: Continued Medical Work up, English as a second language teacher  Expected Discharge Plan and Services In-house Referral: Clinical Social Work   Post Acute Care Choice: Skilled Nursing Facility Living arrangements for the past 2 months: Single Family Home                                       Social Determinants of Health (SDOH) Interventions SDOH Screenings   Food Insecurity: No Food Insecurity (08/31/2023)  Housing: Low Risk  (08/31/2023)  Transportation Needs: No Transportation Needs (08/31/2023)  Utilities: Not At Risk (08/31/2023)  Alcohol Screen: Low Risk  (07/23/2021)  Depression (PHQ2-9): Low Risk  (01/18/2023)  Financial Resource Strain: Low Risk  (07/23/2021)  Physical Activity: Inactive (07/23/2021)  Social Connections: Moderately Integrated (08/31/2023)  Stress: No Stress Concern Present (07/23/2021)  Tobacco Use: Medium Risk (09/16/2023)    Readmission Risk Interventions     No data to display

## 2023-09-17 NOTE — Progress Notes (Signed)
 RT NOTE:  Pt transported to 2M11 on BIPAP without event. Report given to Aimee, RRT.

## 2023-09-17 NOTE — Progress Notes (Signed)
 eLink Physician-Brief Progress Note Patient Name: EDSEL SHIVES DOB: [birth year] MRN: [id number]   Date of Service  09/17/2023  HPI/Events of Note  78 yr old male hospital day 17 transferred to ICU for hypotension.  He has a hx of CAD and CRI. on 5/6 underwent Laparoscopic Appendectomy secondary to perforated appendix. Post op course complicated with Periappendicular abscess, post op ileus and ATN worsening renal function requiring HD.  Tonight hypotension developed with use of cardizem  to treat arrhythmia.  Now on amiodarone  drip but remains in aflutter.  Patient was to receive dialysis today.  CXR this afternoon with evidence of fluid overload.  Now on Bipap.  eICU Interventions  Chart reviewed Discuss dialysis with nephrology     Intervention Category Evaluation Type: New Patient Evaluation  Linda Repress, P 09/17/2023, 1:56 AM

## 2023-09-17 NOTE — Progress Notes (Signed)
 Received patient in bed in ICU Alert, aggitated and restless, Tachycardic and tachypnea noted. Informed consent signed and in chart. TX duration: 3hr   Patient tolerated well. SEE HD treatment sheet HD done. Perm Cath dressing accoridng Hand-off given to patient's nurse.   Access used: HD perm Cath  Access issues: none  Total UF removed:  Medication(s) given: none ( see MAR sched and prn meds given by primary RN)  Post HD VS: SEE Chart below  Post HD weight: 89.6kg      09/17/23 0610  Vitals  Temp 98.3 F (36.8 C)  Temp Source Axillary  BP (!) 104/54  MAP (mmHg) 69  BP Location Right Arm  BP Method Automatic  Patient Position (if appropriate) Lying  Pulse Rate 97  Pulse Rate Source Monitor  ECG Heart Rate 97  Resp (!) 24  Oxygen Therapy  SpO2 100 %  O2 Device Nasal Cannula  O2 Flow Rate (L/min) 3 L/min  Patient Activity (if Appropriate) In bed  Pulse Oximetry Type Continuous  During Treatment Monitoring  Blood Flow Rate (mL/min) 0 mL/min  Arterial Pressure (mmHg) -10.7 mmHg  Venous Pressure (mmHg) 31.91 mmHg  TMP (mmHg) -6.06 mmHg  Ultrafiltration Rate (mL/min) 0 mL/min  Dialysate Flow Rate (mL/min) 300 ml/min  Dialysate Potassium Concentration 2  Dialysate Calcium  Concentration 2.5  Duration of HD Treatment -hour(s) 3 hour(s)  Cumulative Fluid Removed (mL) per Treatment  2014.18  HD Safety Checks Performed Yes  Intra-Hemodialysis Comments Tx completed  Post Treatment  Dialyzer Clearance Lightly streaked  Hemodialysis Intake (mL) 0 mL  Liters Processed 66.3  Fluid Removed (mL) 2020 mL  Tolerated HD Treatment Yes  Post-Hemodialysis Comments HD treatment completed total UF reached  Note  Patient Observations Pt seen more comfortable in the bed on nasal cannula 3 L, no complaints made  Hemodialysis Catheter Right Internal jugular Double lumen Permanent (Tunneled)  Placement Date/Time: 09/16/23 1356   Serial / Lot #: [id number]  Expiration Date:  09/25/27  Time Out: Correct patient;Correct site;Correct procedure  Maximum sterile barrier precautions: Hand hygiene;Large sterile sheet;Cap;Sterile probe cover;Mask;Ste...  Site Condition No complications  Blue Lumen Status Flushed;Heparin  locked;Dead end cap in place  Red Lumen Status Flushed;Heparin  locked;Dead end cap in place  Catheter fill solution Heparin  1000 units/ml  Catheter fill volume (Arterial) 1.9 cc  Catheter fill volume (Venous) 1.9  Dressing Type Transparent  Dressing Status Clean, Dry, Intact  Drainage Description None  Dressing Change Due 09/24/23  Post treatment catheter status Capped and Clamped    Amberia Bayless E. Kapono Luhn, BSN, RN [facility]

## 2023-09-17 NOTE — Progress Notes (Signed)
 Physical Therapy Treatment Patient Details Name: Christian [name] MRN: [id number] DOB: 02-11-1946 Today's Date: 09/17/2023   History of Present Illness 78 y.o. male admitted 08/30/23 with RLQ pain, appendicitis and perforated appendix s/p laparoscopic appendectomy 5/6. Post op ileus. 5/23 Afib. PMHx: CAD s/p CABG, HTN, CKD, DMT2, BPH, multiple sclerosis, neuropathy, mild dementia    PT Comments  Pt willing to participate in therapy stating pain controlled. Pt on 3L on arrival without O2 at baseline, attempted RA with desaturation to 83% and returned to 3L with need for increase to 4L during session with increased RR and pt stating SOB. Pt able to roll and rise with use of stedy for transfer OOB to chair and recommend continued use of stedy for staff. Will follow to improve function and continue to progress toward gait as pt able to tolerate. Patient will benefit from continued inpatient follow up therapy, <3 hours/day      If plan is discharge home, recommend the following: A lot of help with walking and/or transfers;Assistance with cooking/housework;Assist for transportation;Help with stairs or ramp for entrance;Supervision due to cognitive status   Can travel by private vehicle     No  Equipment Recommendations  Hospital bed;BSC/3in1;Wheelchair cushion (measurements PT);Wheelchair (measurements PT)    Recommendations for Other Services       Precautions / Restrictions Precautions Precautions: Fall Recall of Precautions/Restrictions: Impaired Precaution/Restrictions Comments: incontinent stool, watch sats     Mobility  Bed Mobility Overal bed mobility: Needs Assistance Bed Mobility: Rolling, Sidelying to Sit Rolling: Min assist, Mod assist Sidelying to sit: Min assist, Used rails       General bed mobility comments: min assist to roll left, mod to roll right with cues for use of rail, sequene and safety. Side to sit cues and increased time to clear legs with min assist to rise  to sitting    Transfers Overall transfer level: Needs assistance   Transfers: Sit to/from Stand, Bed to chair/wheelchair/BSC Sit to Stand: Via lift equipment           General transfer comment: sit to stand from bed to stedy, at stedy x 2 trials and from chair to stedy with min +2 assist for all with use of pad, cues for hand placement and safety. Pt with increased RR with standing needing cues for slowed and controlled breaths on 4L to maintain 95% max HR 135. pt denied further attempts Transfer via Lift Equipment: Stedy  Ambulation/Gait               General Gait Details: unable at this time.   Stairs             Wheelchair Mobility     Tilt Bed    Modified Rankin (Stroke Patients Only)       Balance Overall balance assessment: Needs assistance Sitting-balance support: No upper extremity supported, Feet supported Sitting balance-Leahy Scale: Fair Sitting balance - Comments: CGA EOB   Standing balance support: Reliant on assistive device for balance, Bilateral upper extremity supported, During functional activity Standing balance-Leahy Scale: Poor Standing balance comment: reliant on UB Support                            Communication Communication Communication: No apparent difficulties  Cognition Arousal: Alert Behavior During Therapy: Anxious   PT - Cognitive impairments: Problem solving, Initiation, Safety/Judgement, No family/caregiver present to determine baseline, Awareness, Orientation   Orientation impairments: Time, Situation  PT - Cognition Comments: delayed initiation and command following, fearful and anxious. Requires constant cues for breathing and reassurance Following commands: Impaired Following commands impaired: Follows one step commands with increased time, Follows one step commands inconsistently    Cueing Cueing Techniques: Verbal cues, Gestural cues, Tactile cues  Exercises       General Comments        Pertinent Vitals/Pain Pain Assessment Pain Assessment: No/denies pain    Home Living                          Prior Function            PT Goals (current goals can now be found in the care plan section) Progress towards PT goals: Progressing toward goals    Frequency    Min 2X/week      PT Plan      Co-evaluation              AM-PAC PT "6 Clicks" Mobility   Outcome Measure  Help needed turning from your back to your side while in a flat bed without using bedrails?: A Little Help needed moving from lying on your back to sitting on the side of a flat bed without using bedrails?: A Lot Help needed moving to and from a bed to a chair (including a wheelchair)?: A Lot Help needed standing up from a chair using your arms (e.g., wheelchair or bedside chair)?: Total Help needed to walk in hospital room?: Total Help needed climbing 3-5 steps with a railing? : Total 6 Click Score: 10    End of Session Equipment Utilized During Treatment: Oxygen Activity Tolerance: Patient tolerated treatment well Patient left: with call bell/phone within reach;in chair;with chair alarm set;with family/visitor present Nurse Communication: Mobility status;Need for lift equipment PT Visit Diagnosis: Other abnormalities of gait and mobility (R26.89);Muscle weakness (generalized) (M62.81)     Time: 1610-9604 PT Time Calculation (min) (ACUTE ONLY): 24 min  Charges:    $Therapeutic Activity: 23-37 mins PT General Charges $$ ACUTE PT VISIT: 1 Visit                     [name], PT Acute Rehabilitation Services Office: [phone]    Christian [name] 09/17/2023, 1:06 PM

## 2023-09-17 NOTE — Progress Notes (Addendum)
 General Surgery Follow Up Note  Subjective:    Overnight Issues: moved to ICU post-op from HD cath due to SVT, afib with RVR and transient hypotension.  Cc L buttock pain.  Just passed gas and had a BM. About to get OOB for PT. Wife at bedside.   Objective:  Vital signs for last 24 hours: Temp:  [98.1 F (36.7 C)-99.4 F (37.4 C)] 98.3 F (36.8 C) (05/23 0610) Pulse Rate:  [91-162] 103 (05/23 1000) Resp:  [20-35] 25 (05/23 1000) BP: (87-213)/(33-101) 137/66 (05/23 1000) SpO2:  [85 %-100 %] 96 % (05/23 1000) FiO2 (%):  [40 %-100 %] 40 % (05/23 0400) Weight:  [86.3 kg-89.6 kg] 89.6 kg (05/23 0500)  Hemodynamic parameters for last 24 hours:    Intake/Output from previous day: 05/22 0701 - 05/23 0700 In: 1570.5 [I.V.:793.1; IV Piggyback:777.4] Out: 2395 [Urine:375]  Intake/Output this shift: Total I/O In: 236.2 [I.V.:170.2; IV Piggyback:66] Out: 200 [Urine:200]  Vent settings for last 24 hours: Vent Mode: PSV;CPAP FiO2 (%):  [40 %-100 %] 40 % PEEP:  [5 cmH20] 5 cmH20 Pressure Support:  [5 cmH20-8 cmH20] 8 cmH20  Physical Exam:  Gen: laying flat. Tachypneic with some belly breathing Neuro: follows commands, alert, communicative HEENT: PERRL Neck: supple CV: HR 103, irregular Pulm: slightly labored on room air Abd: soft, mid distention, mild tenderness of RLQ  GU: HD, foley, clear urine Extr: wwp, no edema  Results for orders placed or performed during the hospital encounter of 08/30/23 (from the past 24 hours)  Glucose, capillary     Status: Abnormal   Collection Time: 09/16/23 11:49 AM  Result Value Ref Range   Glucose-Capillary 167 (H) 70 - 99 mg/dL   Comment 1 Notify RN    Comment 2 Document in Chart   Glucose, capillary     Status: Abnormal   Collection Time: 09/16/23  2:36 PM  Result Value Ref Range   Glucose-Capillary 156 (H) 70 - 99 mg/dL  Glucose, capillary     Status: Abnormal   Collection Time: 09/16/23  3:59 PM  Result Value Ref Range    Glucose-Capillary 163 (H) 70 - 99 mg/dL   Comment 1 Notify RN    Comment 2 Document in Chart   Renal function panel     Status: Abnormal   Collection Time: 09/16/23  5:44 PM  Result Value Ref Range   Sodium 144 135 - 145 mmol/L   Potassium 4.6 3.5 - 5.1 mmol/L   Chloride 107 98 - 111 mmol/L   CO2 23 22 - 32 mmol/L   Glucose, Bld 171 (H) 70 - 99 mg/dL   BUN 90 (H) 8 - 23 mg/dL   Creatinine, Ser 2.95 (H) 0.61 - 1.24 mg/dL   Calcium  9.0 8.9 - 10.3 mg/dL   Phosphorus 4.7 (H) 2.5 - 4.6 mg/dL   Albumin  2.1 (L) 3.5 - 5.0 g/dL   GFR, Estimated 14 (L) >60 mL/min   Anion gap 14 5 - 15  Glucose, capillary     Status: Abnormal   Collection Time: 09/16/23  7:39 PM  Result Value Ref Range   Glucose-Capillary 176 (H) 70 - 99 mg/dL   Comment 1 Notify RN   Basic metabolic panel     Status: Abnormal   Collection Time: 09/16/23 10:49 PM  Result Value Ref Range   Sodium 142 135 - 145 mmol/L   Potassium 4.5 3.5 - 5.1 mmol/L   Chloride 108 98 - 111 mmol/L   CO2 23 22 -  32 mmol/L   Glucose, Bld 180 (H) 70 - 99 mg/dL   BUN 93 (H) 8 - 23 mg/dL   Creatinine, Ser 1.61 (H) 0.61 - 1.24 mg/dL   Calcium  9.1 8.9 - 10.3 mg/dL   GFR, Estimated 14 (L) >60 mL/min   Anion gap 11 5 - 15  Magnesium      Status: None   Collection Time: 09/16/23 10:49 PM  Result Value Ref Range   Magnesium  2.2 1.7 - 2.4 mg/dL  CBC     Status: Abnormal   Collection Time: 09/16/23 10:49 PM  Result Value Ref Range   WBC 13.4 (H) 4.0 - 10.5 K/uL   RBC 2.63 (L) 4.22 - 5.81 MIL/uL   Hemoglobin 8.6 (L) 13.0 - 17.0 g/dL   HCT 09.6 (L) 04.5 - 40.9 %   MCV 103.4 (H) 80.0 - 100.0 fL   MCH 32.7 26.0 - 34.0 pg   MCHC 31.6 30.0 - 36.0 g/dL   RDW 81.1 (H) 91.4 - 78.2 %   Platelets 317 150 - 400 K/uL   nRBC 0.0 0.0 - 0.2 %  Glucose, capillary     Status: Abnormal   Collection Time: 09/17/23  1:29 AM  Result Value Ref Range   Glucose-Capillary 168 (H) 70 - 99 mg/dL  I-STAT 7, (LYTES, BLD GAS, ICA, H+H)     Status: Abnormal    Collection Time: 09/17/23  1:41 AM  Result Value Ref Range   pH, Arterial 7.426 7.35 - 7.45   pCO2 arterial 35.1 32 - 48 mmHg   pO2, Arterial 447 (H) 83 - 108 mmHg   Bicarbonate 23.1 20.0 - 28.0 mmol/L   TCO2 24 22 - 32 mmol/L   O2 Saturation 100 %   Acid-base deficit 1.0 0.0 - 2.0 mmol/L   Sodium 143 135 - 145 mmol/L   Potassium 4.7 3.5 - 5.1 mmol/L   Calcium , Ion 1.17 1.15 - 1.40 mmol/L   HCT 23.0 (L) 39.0 - 52.0 %   Hemoglobin 7.8 (L) 13.0 - 17.0 g/dL   Patient temperature 95.6 F    Collection site RADIAL, ALLEN'S TEST ACCEPTABLE    Drawn by RT    Sample type ARTERIAL   Basic metabolic panel with GFR     Status: Abnormal   Collection Time: 09/17/23  2:32 AM  Result Value Ref Range   Sodium 143 135 - 145 mmol/L   Potassium 4.5 3.5 - 5.1 mmol/L   Chloride 104 98 - 111 mmol/L   CO2 22 22 - 32 mmol/L   Glucose, Bld 202 (H) 70 - 99 mg/dL   BUN 92 (H) 8 - 23 mg/dL   Creatinine, Ser 2.13 (H) 0.61 - 1.24 mg/dL   Calcium  9.3 8.9 - 10.3 mg/dL   GFR, Estimated 14 (L) >60 mL/min   Anion gap 17 (H) 5 - 15  Magnesium      Status: None   Collection Time: 09/17/23  2:32 AM  Result Value Ref Range   Magnesium  2.3 1.7 - 2.4 mg/dL  Phosphorus     Status: None   Collection Time: 09/17/23  2:32 AM  Result Value Ref Range   Phosphorus 4.3 2.5 - 4.6 mg/dL  TSH     Status: None   Collection Time: 09/17/23  2:32 AM  Result Value Ref Range   TSH 1.378 0.350 - 4.500 uIU/mL  Glucose, capillary     Status: Abnormal   Collection Time: 09/17/23  3:42 AM  Result Value Ref Range   Glucose-Capillary  138 (H) 70 - 99 mg/dL  Glucose, capillary     Status: Abnormal   Collection Time: 09/17/23  7:51 AM  Result Value Ref Range   Glucose-Capillary 116 (H) 70 - 99 mg/dL    Assessment & Plan: The plan of care was discussed with the bedside dialysis nurse for the day, who is in agreement with this plan and no additional concerns were raised.   Present on Admission:  Appendicitis  HTN (hypertension)   Ischemic cardiomyopathy  Acute renal failure superimposed on stage 4 chronic kidney disease (HCC)  CAD (coronary artery disease)  (Resolved) Acute appendicitis  ABLA (acute blood loss anemia)    LOS: 17 days   Additional comments:I reviewed the patient's new clinical lab test results.   and I reviewed the patients new imaging test results.    POD 15 - s/p lap appy with JP drain placement for perforated appendicitis [name] 5/6  - prolonged ileus now improving, NG removed 5/21. CLD this AM and if swallowing safely s/p OR yesterday then advance back to soft diet. - CT abd pelvis 5/14 shows JP within a hematoma, post-op ileus with dilated small bowel, bilateral R>L pleural effusions.  - CT scan repeated 5/20 by ID shows slight increase in size of RLQ hematoma and interval decrease in small bowel dilation.  - afebrile x48h, blood Cx 5/19 NGTD, abx stopped  - JP removed 5/21 - encourage OOB, ambulation      FEN - ok to advance back to soft diet today, wean TPN Lines/tubes - tunneled HD cath 5/22 VVS, PIV x2 RUE, LUE w/ AV Fistula (non-functional) Foley - in place, clear yellow urine VTE - heparin /plavix  on hold ID - Zosyn  d/c 5/21; WBC 13 from 11 (POD#1 s/p tunneled cath)    Michial Akin, [facility] Surgery Please see Amion for pager number during day hours 7:00am-4:30pm       09/17/2023  *Care during the described time interval was provided by me. I have reviewed this patient's available data, including medical history, events of note, physical examination and test results as part of my evaluation.

## 2023-09-17 NOTE — Consult Note (Signed)
 NAME:  Christian [name], MRN:  [id number], DOB:  Jan 04, 1946, LOS: 17 ADMISSION DATE:  08/30/2023, CONSULTATION DATE:  09/17/2023 REFERRING MD:  Dr. 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   Post op course complicated with Periappendicular abscess, post op ileus and ATN worsening renal function requiring HD. On 5/22 he underwent a right internal jugular tunneled dialysis catheter. Tonight PCCM called for hypotension.  He apparently was in SVT and so Cardizem  drip was started on Diltiazem  drip which did not help his rate but low her BP with SBP in the 80's.  The also on BiPAP and lethargic.  Cardizem  stopped and amiodarone  ordered  to start after 1 liter LR bolus.  Pertinent  Medical History   has a past medical history of Anemia in chronic kidney disease (01/19/2020), Aortic atherosclerosis (03/27/2020), Atypical pneumonia (12/12/2020), Benign prostatic hyperplasia (09/27/2014), CAD (coronary artery disease) (07/30/2018), CHF (congestive heart failure), CKD (chronic kidney disease) stage 4, GFR 15-29 ml/min (03/27/2020), Diabetic polyneuropathy, Elevated liver enzymes (08/09/2019), Hiatal hernia, History of coronary artery stent placement, History of pulmonary embolism (08/09/2019), HTN (hypertension) (11/12/2009), Hyperlipidemia associated with type 2 diabetes mellitus (11/12/2009), Left leg weakness (11/23/2018), Mild dementia, 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  Significant Hospital Events: Including procedures, antibiotic start and stop dates in addition to other pertinent events   5/23 transfer to ICU  Interim  History / Subjective:  N/a  Objective    Blood pressure 118/62, pulse 99, temperature 98.3 F (36.8 C), temperature source Axillary, resp. rate (!) 27, height 5\' 9"  (1.753 m), weight 89.6 kg, SpO2 98%.    Vent Mode: PSV;CPAP FiO2 (%):  [40 %-100 %] 40 % PEEP:  [5 cmH20] 5 cmH20 Pressure Support:  [5 cmH20-8 cmH20] 8 cmH20   Intake/Output Summary (Last 24 hours) at 09/17/2023 7829 Last data filed at 09/17/2023 5621 Gross per 24 hour  Intake 1497.28 ml  Output 2395 ml  Net -897.72 ml   Filed Weights   09/16/23 0702 09/16/23 1203 09/17/23 0500  Weight: 86.3 kg 86.3 kg 89.6 kg    Examination: General: awake but lethargic, on BiPAP, No Acute Respiratory distress HENT: pupils reactive, no icterus, EOMI Lungs: CTA mildly course, no wheezes no rales Cardiovascular: irreg, tachy no murmurs Abdomen: soft , nt nd bs pos Extremities: minimal edema LE b/l, no cyanosis, clubbing or edema Neuro: Lethargic but arouseable, follows simple commands, generalized weakness   Resolved problem list   Assessment and Plan  #Hypotension Was likely in the setting of cardizem  and dehydration, has resolved and not required any pressors.   #Acute Respiratory Failure  On 3L of oxygen now, improved from BiPAP overnight. ABG reassuring.   #Atrial Flutter with RVR  On Amioadarone, has resolved  #S/P Laparoscopic appendectomy for Perforated Appendix  #Appendical Abscess #Post-Op Ileus Pt is having bowel movements, repeating KUB today. Per chart review, NG tube was removed.Advancing diet slowly to clear liquids. Surgery also following. Will need to get patient out of bed, however he denied PT/OT. Repleting electrolytes as needed.   #A/C CKD s/p [facility] Placement  Plan for HD tomorrow, symptoms may be secondary to delayed dialysis yesterday. Nephrology on board, diuresing today.   #  HTN  Holding anti-hypertensives     Best Practice (right click and "Reselect all SmartList Selections" daily)    Diet/type: NPO DVT prophylaxis prophylactic heparin   Pressure ulcer(s): N/A GI prophylaxis: N/A Lines: N/A Foley:  Yes, and it is still needed Code Status:  full code  Labs   CBC: Recent Labs  Lab 09/10/23 1049 09/11/23 0546 09/11/23 2140 09/13/23 0559 09/14/23 0646 09/16/23 0435 09/16/23 2249 09/17/23 0141  WBC 13.8* 12.6* 11.9* 12.8* 13.2* 11.8* 13.4*  --   NEUTROABS 11.2* 10.3*  --  9.5*  --   --   --   --   HGB 8.5* 8.4* 7.5* 8.7* 8.8* 8.5* 8.6* 7.8*  HCT 25.7* 25.6* 23.5* 26.9* 27.3* 26.9* 27.2* 23.0*  MCV 100.0 100.8* 101.3* 100.0 99.6 102.3* 103.4*  --   PLT 411* 420* 379 375 371 322 317  --     Basic Metabolic Panel: Recent Labs  Lab 09/13/23 0559 09/14/23 0646 09/15/23 0920 09/16/23 0435 09/16/23 1744 09/16/23 2249 09/17/23 0141 09/17/23 0232  NA 140 144 140 142 144 142 143 143  K 3.4* 3.8 3.9 3.9 4.6 4.5 4.7 4.5  CL 99 102 103 104 107 108  --  104  CO2 28 26 26 23 23 23   --  22  GLUCOSE 209* 200* 204* 167* 171* 180*  --  202*  BUN 74* 91* 63* 83* 90* 93*  --  92*  CREATININE 4.59* 4.93* 3.54* 4.04* 4.30* 4.28*  --  4.18*  CALCIUM  8.5* 8.7* 8.7* 9.1 9.0 9.1  --  9.3  MG 2.0  --  1.6* 1.7  --  2.2  --  2.3  PHOS 2.2* 3.5 3.4 3.5 4.7*  --   --  4.3   GFR: Estimated Creatinine Clearance: 16.7 mL/min (A) (by C-G formula based on SCr of 4.18 mg/dL (H)). Recent Labs  Lab 09/13/23 0559 09/14/23 0646 09/16/23 0435 09/16/23 2249  WBC 12.8* 13.2* 11.8* 13.4*    Liver Function Tests: Recent Labs  Lab 09/10/23 1050 09/13/23 0559 09/15/23 0920 09/16/23 0435 09/16/23 1744  AST  --  70*  --  102*  --   ALT  --  45*  --  105*  --   ALKPHOS  --  83  --  97  --   BILITOT  --  1.7*  --  0.8  --   PROT  --  5.9*  --  6.3*  --   ALBUMIN  1.7* 1.7* 1.8* 1.8* 2.1*   No results for input(s): "LIPASE", "AMYLASE" in the last 168 hours. No results for input(s): "AMMONIA" in the last 168 hours.  ABG    Component Value Date/Time   PHART 7.426  09/17/2023 0141   PCO2ART 35.1 09/17/2023 0141   PO2ART 447 (H) 09/17/2023 0141   HCO3 23.1 09/17/2023 0141   TCO2 24 09/17/2023 0141   ACIDBASEDEF 1.0 09/17/2023 0141   O2SAT 100 09/17/2023 0141     Coagulation Profile: No results for input(s): "INR", "PROTIME" in the last 168 hours.  Cardiac Enzymes: No results for input(s): "CKTOTAL", "CKMB", "CKMBINDEX", "TROPONINI" in the last 168 hours.  HbA1C: Hgb A1c MFr Bld  Date/Time Value Ref Range Status  04/12/2023 11:22 AM 7.3 (H) 4.6 - 6.5 % Final    Comment:    Glycemic Control Guidelines for People with Diabetes:Non Diabetic:  <6%Goal of Therapy: <7%Additional Action Suggested:  >8%   01/18/2023 11:18 AM 9.5 (H) 4.6 - 6.5 % Final    Comment:  Glycemic Control Guidelines for People with Diabetes:Non Diabetic:  <6%Goal of Therapy: <7%Additional Action Suggested:  >8%     CBG: Recent Labs  Lab 09/16/23 1559 09/16/23 1939 09/17/23 0129 09/17/23 0342 09/17/23 0751  GLUCAP 163* 176* 168* 138* 116*    Review of Systems:   Lethargic not able to get full ROS  Past Medical History:  He,  has a past medical history of Anemia in chronic kidney disease (01/19/2020), Aortic atherosclerosis (03/27/2020), Atypical pneumonia (12/12/2020), Benign prostatic hyperplasia (09/27/2014), CAD (coronary artery disease) (07/30/2018), CHF (congestive heart failure), CKD (chronic kidney disease) stage 4, GFR 15-29 ml/min (03/27/2020), Diabetic polyneuropathy, Elevated liver enzymes (08/09/2019), Hiatal hernia, History of coronary artery stent placement, History of pulmonary embolism (08/09/2019), HTN (hypertension) (11/12/2009), Hyperlipidemia associated with type 2 diabetes mellitus (11/12/2009), Left leg weakness (11/23/2018), Mild dementia, 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   Surgical History:   Past Surgical History:  Procedure Laterality Date   A/V FISTULAGRAM Left 08/12/2022   Procedure: A/V Fistulagram;  Surgeon: Kayla Part, MD;  Location: [facility] INVASIVE CV LAB;  Service: Cardiovascular;  Laterality: Left;   A/V FISTULAGRAM N/A 09/07/2023   Procedure: A/V Fistulagram;  Surgeon: Margherita Shell, MD;  Location: [facility] INVASIVE CV LAB;  Service: Cardiovascular;  Laterality: N/A;   APPLICATION OF WOUND VAC N/A 08/24/2019   Procedure: APPLICATION OF WOUND VAC;  Surgeon: Rudine Cos, MD;  Location: [facility] OR;  Service: Thoracic;  Laterality: N/A;   APPLICATION OF WOUND VAC  08/29/2019   Procedure: Wound Vac change;  Surgeon: Rudine Cos, MD;  Location: [facility] OR;  Service: Open Heart Surgery;;   APPLICATION OF WOUND VAC N/A 09/04/2019   Procedure: Application Of Wound Vac;  Surgeon: Norita Beauvais, MD;  Location: [facility] OR;  Service: Open Heart Surgery;  Laterality: N/A;   APPLICATION OF WOUND VAC N/A 09/06/2019   Procedure: APPLICATION OF ACELL, APPLICATION OF WOUND VAC USING PREVENA INCISIONAL  DRESSING;  Surgeon: Thornell Flirt, DO;  Location: [facility] OR;  Service: Plastics;  Laterality: N/A;   AV FISTULA PLACEMENT Left 09/18/2020   Procedure: ARTERIOVENOUS (AV) FISTULA CREATION LEFT VERSUS GRAFT;  Surgeon: Margherita Shell, MD;  Location: [facility] OR;  Service: Vascular;  Laterality: Left;   BASCILIC VEIN TRANSPOSITION Left 10/31/2020   Procedure: LEFT SECOND STAGE BASCILIC VEIN TRANSPOSITION;  Surgeon: Margherita Shell, MD;  Location: [facility] OR;  Service: Vascular;  Laterality: Left;   CARDIAC CATHETERIZATION     CORONARY ARTERY BYPASS GRAFT N/A 08/15/2019   Procedure: CORONARY ARTERY BYPASS GRAFTING (CABG), x 4 using bilateral IMAs and left radial artery .  LIMA TO LAD, RIMA TO PDA, RADIAL ARTERY TO CIRC AND SEQUENTIALLY TO OM1.;  Surgeon: Rudine Cos, MD;  Location: [facility] OR;  Service: Open Heart Surgery;  Laterality: N/A;   CORONARY STENT PLACEMENT   02/27/2014   distal rt/pd coronary       dr Berry [place]   CYSTO/ BLADDER BIOPSY'S/ CAUTHERIZATION  01-14-2004  DR Isla Mari   EXPLORATION POST OPERATIVE OPEN HEART N/A 08/16/2019   Procedure: Chest Closure S?P CABG WITH APPLICATION OF PREVENA  INCISIONAL WOUND VAC;  Surgeon: Rudine Cos, MD;  Location: [facility] OR;  Service: Open Heart Surgery;  Laterality: N/A;   EXPLORATION POST OPERATIVE OPEN HEART N/A 08/21/2019   Procedure: CHEST WASHOUT S/P OPEN CHEST;  Surgeon: Rudine Cos, MD;  Location: [facility] OR;  Service: Open Heart Surgery;  Laterality: N/A;  Open chest with Esmark dressing with Ioban sealant coverage.   EXPLORATION POST OPERATIVE OPEN HEART N/A 08/18/2019   Procedure: EXPLORATION POST OPERATIVE OPEN HEART (performed 04/23 on unit);  Surgeon: Rudine Cos, MD;  Location: [facility] OR;  Service: Open Heart Surgery;  Laterality: N/A;   EXPLORATION POST OPERATIVE OPEN HEART N/A 08/24/2019   Procedure: CHEST WASHOUT POST OPERATIVE OPEN HEART;  Surgeon: Rudine Cos, MD;  [facility];  Service: Open Heart Surgery;  Laterality: N/A;   EXPLORATION POST OPERATIVE OPEN HEART N/A 08/29/2019   Procedure: CHEST WOUND WASHOUT POST OPERATIVE OPEN HEART;  Surgeon: Rudine Cos, MD;  [facility]: [facility];  Service: Open Heart Surgery;  Laterality: N/A;   EXPLORATION POST OPERATIVE OPEN HEART N/A 09/04/2019   Procedure: MEDIASTINAL EXPLORATION WITH STERNAL WOUND IRRIGATION;  Surgeon: Norita Beauvais, MD;  Location: [facility] OR;  Service: Open Heart Surgery;  Laterality: N/A;   EXPLORATION POST OPERATIVE OPEN HEART N/A 09/14/2019   Procedure: EVACUATION OF HEMATOMA;  Surgeon: Norita Beauvais, MD;  [facility] OR;  Service: Open Heart Surgery;  Laterality: N/A;   INSERTION OF DIALYSIS CATHETER N/A 09/16/2023   Procedure: INSERTION OF DIALYSIS CATHETER;  Surgeon: Young Hensen, MD;  [facility]: [facility];  Service: Vascular;  Laterality: N/A;  TUNNELED DIALYSIS CATHETER   IR FLUORO GUIDE CV LINE LEFT   09/19/2019   IR GASTROSTOMY TUBE MOD SED  10/04/2019   IR GASTROSTOMY TUBE REMOVAL  11/29/2019   IR PATIENT EVAL TECH 0-60 MINS  09/29/2019   IR US  GUIDE VASC ACCESS LEFT  09/19/2019   IR US  GUIDE VASC ACCESS RIGHT  09/08/2023   LAPAROSCOPIC APPENDECTOMY N/A 08/31/2023   Procedure: APPENDECTOMY, LAPAROSCOPIC;  Surgeon: Anda Bamberg, MD;  [facility]: [facility];  Service: General;  Laterality: N/A;   LAPAROSCOPIC LYSIS OF ADHESIONS N/A 09/06/2019   Procedure: LAPAROSCOPIC OMENTAL HARVEST;  Surgeon: Derral Flick, MD;  Location: [facility];  Service: General;  Laterality: N/A;   LEFT HEART CATH AND CORONARY ANGIOGRAPHY N/A 08/10/2019   Procedure: LEFT HEART CATH AND CORONARY ANGIOGRAPHY;  Surgeon: Cody Das, MD;  [facility] INVASIVE CV LAB;  Service: Cardiovascular;  Laterality: N/A;   LEFT HEART CATHETERIZATION WITH CORONARY ANGIOGRAM N/A 02/27/2014   Procedure: LEFT HEART CATHETERIZATION WITH CORONARY ANGIOGRAM;  Surgeon: Jessica Morn, MD;  Location: [facility] CATH LAB;  Service: Cardiovascular;  Laterality: N/A;   MEDIASTINAL EXPLORATION N/A 09/06/2019   Procedure: MEDIASTINAL EXPLORATION;  Surgeon: Norita Beauvais, MD;  Location: [facility] OR;  Service: Thoracic;  Laterality: N/A;   PECTORALIS FLAP  09/06/2019   Procedure: Pectoralis ADVANCEMENT Flap;  Surgeon: Thornell Flirt, DO;  Location: [facility] OR;  Service: Plastics;;   PERCUTANEOUS CORONARY STENT INTERVENTION (PCI-S)  02/27/2014   Procedure: PERCUTANEOUS CORONARY STENT INTERVENTION (PCI-S);  Surgeon: Jessica Morn, MD;  Location: [facility] CATH LAB;  Service: Cardiovascular;;  rt PDA  3.0/28mm Promus stent   PERIPHERAL VASCULAR INTERVENTION Left 08/12/2022   Procedure: PERIPHERAL VASCULAR INTERVENTION;  Surgeon: Kayla Part, MD;  Location: [facility] INVASIVE CV LAB;  Service: Cardiovascular;  Laterality: Left;   RADIAL ARTERY HARVEST Left 08/15/2019   Procedure: Radial Artery Harvest;  Surgeon: Rudine Cos, MD;  Location: [facility] OR;  Service: Open  Heart Surgery;  Laterality: Left;   REMOVAL OF A HERO DEVICE Right 09/16/2023   Procedure: REMOVAL OF TEMPORARY TUNNELED HEMODIALYSIS CATHETER;  Surgeon: Jimmye Moulds  J, MD;  Location: [facility] OR;  Service: Vascular;  Laterality: Right;   RIB PLATING N/A 09/06/2019   Procedure: STERNAL PLATING;  Surgeon: Norita Beauvais, MD;  Location: [facility] OR;  Service: Thoracic;  Laterality: N/A;   TEE WITHOUT CARDIOVERSION N/A 08/15/2019   Procedure: TRANSESOPHAGEAL ECHOCARDIOGRAM (TEE);  Surgeon: Rudine Cos, MD;  Location: [facility] OR;  Service: Open Heart Surgery;  Laterality: N/A;   TRACHEOSTOMY TUBE PLACEMENT  08/29/2019   Procedure: Tracheostomy;  Surgeon: Rudine Cos, MD;  Location: [facility] OR;  Service: Open Heart Surgery; Decannulated 6/10/20211   TRANSURETHRAL RESECTION OF PROSTATE  04/04/2012   Procedure: TRANSURETHRAL RESECTION OF THE PROSTATE WITH GYRUS INSTRUMENTS;  Surgeon: Edmund Gouge, MD;  Location: [facility];  Service: Urology;  Laterality: N/A;   TRANSURETHRAL RESECTION OF PROSTATE N/A 09/27/2014   Procedure: TRANSURETHRAL RESECTION OF THE PROSTATE ;  Surgeon: Annamarie Kid, MD;  Location: [facility] ORS;  Service: Urology;  Laterality: N/A;   TUNNELLED CATHETER EXCHANGE N/A 09/07/2023   Procedure: TUNNELLED CATHETER EXCHANGE;  Surgeon: Margherita Shell, MD;  Location: [facility] INVASIVE CV LAB;  Service: Cardiovascular;  Laterality: N/A;   ULTRASOUND GUIDANCE FOR VASCULAR ACCESS Right 09/16/2023   Procedure: ULTRASOUND GUIDANCE, FOR VASCULAR ACCESS;  Surgeon: Young Hensen, MD;  Location: [facility] OR;  Service: Vascular;  Laterality: Right;   UPPER GASTROINTESTINAL ENDOSCOPY       Social History:   reports that he quit smoking about 50 years ago. His smoking use included cigarettes. He started smoking about 70 years ago. He has a 10 pack-year smoking history. He has never used smokeless tobacco. He reports that he does not currently use alcohol. He reports that he does not use  drugs.   Family History:  His family history includes Bone cancer in his brother; Diabetes in his brother, brother, father, and mother; Hypertension in his brother and mother.   Allergies No Known Allergies   Home Medications  Prior to Admission medications   Medication Sig Start Date End Date Taking? Authorizing Provider  acetaminophen  (TYLENOL ) 325 MG tablet Take 650 mg by mouth every 6 (six) hours as needed.   Yes [provider]  acetaminophen  (TYLENOL ) 500 MG tablet Take 2 tablets (1,000 mg total) by mouth 4 (four) times daily. 08/31/23 08/30/24 Yes Anda Bamberg, MD  albuterol  (VENTOLIN  HFA) 108 (90 Base) MCG/ACT inhaler TAKE 2 PUFFS BY MOUTH EVERY 6 HOURS AS NEEDED FOR WHEEZE OR SHORTNESS OF BREATH 04/22/21  Yes [name], MD  atorvastatin  (LIPITOR) 20 MG tablet Take 1 tablet (20 mg total) by mouth every evening. Patient taking differently: Take 20 mg by mouth daily after supper. 08/24/23  Yes Knox Perl, MD  budesonide -formoterol  (SYMBICORT ) 160-4.5 MCG/ACT inhaler INHALE 2 PUFFS INTO THE LUNGS TWICE A DAY 12/09/22  Yes Wilfredo Hanly, MD  calcitRIOL (ROCALTROL) 0.25 MCG capsule Take 0.25 mcg by mouth 3 (three) times a week. Take one capsule at dinner after on Mon-Wed-Fri. 07/21/23  Yes [provider]  clonazePAM  (KLONOPIN ) 0.5 MG disintegrating tablet TAKE 1 TABLET BY MOUTH TWICE A DAY 03/15/23  Yes [name], MD  clopidogrel  (PLAVIX ) 75 MG tablet Take 1 tablet (75 mg total) by mouth daily. 06/24/23  Yes Knox Perl, MD  cromolyn  (OPTICROM ) 4 % ophthalmic solution Place 1 drop into both eyes every Wednesday. At bedtime 12/08/19  Yes [provider]  docusate sodium  (COLACE) 100 MG capsule Take 1 capsule (100 mg total) by mouth 2 (two)  times daily. 08/31/23 08/30/24 Yes Lovick, Bard Boor, MD  ferrous sulfate  325 (65 FE) MG tablet Take 325 mg by mouth daily with breakfast.   Yes [provider]  furosemide  (LASIX ) 80 MG tablet Take  120 mg by mouth 2 (two) times daily. Take one and one-half tablet in the morning at 0800 and 1400 daily 11/30/20  Yes [provider]  glimepiride  (AMARYL ) 2 MG tablet TAKE 1 TABLET BY MOUTH DAILY BEFORE BREAKFAST. 08/09/23  Yes [name], MD  hydrALAZINE  (APRESOLINE ) 100 MG tablet TAKE 1 TABLET BY MOUTH 3 TIMES DAILY. 10/12/22  Yes Knox Perl, MD  isosorbide  dinitrate (ISORDIL ) 30 MG tablet TAKE 1 TABLET BY MOUTH 3 TIMES DAILY. 05/03/23  Yes [name], MD  methocarbamol  (ROBAXIN -750) 750 MG tablet Take 1 tablet (750 mg total) by mouth 4 (four) times daily. 08/31/23  Yes Anda Bamberg, MD  metolazone  (ZAROXOLYN ) 5 MG tablet Take 5 mg by mouth 3 (three) times a week. Take one tablet by mouth on Sun-Tues-Thurs before each dose of Furosemide .   Yes [provider]  metoprolol  succinate (TOPROL -XL) 50 MG 24 hr tablet Take 3 tablets (150 mg total) by mouth daily. Take with or immediately following a meal. 05/11/23  Yes Knox Perl, MD  Multiple Vitamin (MULTIVITAMIN) tablet Take 1 tablet by mouth daily.   Yes [provider]  nitroGLYCERIN  (NITROSTAT ) 0.4 MG SL tablet Place 0.4 mg under the tongue every 5 (five) minutes as needed for chest pain. 10/25/19  Yes [provider]  oxyCODONE  (ROXICODONE ) 5 MG immediate release tablet Take 1 tablet (5 mg total) by mouth every 4 (four) hours as needed. 08/31/23  Yes Anda Bamberg, MD  potassium chloride  (KLOR-CON ) 10 MEQ tablet Take 30 mEq by mouth daily. 10/25/20  Yes [provider]  PREVIDENT  5000 BOOSTER PLUS 1.1 % PSTE TAKE 1 APPLICATION BY MOUTH EVERY EVENING. 09/14/22  Yes [name], MD  senna-docusate (SENOKOT-S) 8.6-50 MG tablet Take 1 tablet by mouth at bedtime as needed for mild constipation.   Yes [provider]  sertraline  (ZOLOFT ) 25 MG tablet TAKE 1 TABLET BY MOUTH EVERY DAY 10/26/22  Yes [name], MD  silodosin (RAPAFLO) 8 MG CAPS capsule Take 1  capsule (8 mg total) by mouth at bedtime. 09/25/21  Yes McKenzie, Arden Beck, MD     Critical care time: 39   The patient is critically ill with multiple organ system failure and requires high complexity decision making for assessment and support, frequent evaluation and titration of therapies, advanced monitoring, review of radiographic studies and interpretation of complex data.   Critical Care Time devoted to patient care services, exclusive of separately billable procedures, described in this note is 35 minutes.   Claven Cumming, MD [facility] See Amion for pager  If no response to pager , please call [phone] until 7pm After 7:00 pm call Elink  [phone] 09/17/2023, 9:27 AM

## 2023-09-17 NOTE — Progress Notes (Signed)
 PT Cancellation Note  Patient Details Name: Christian [name] MRN: [id number] DOB: 11-04-1945   Cancelled Treatment:    Reason Eval/Treat Not Completed: Pain limiting ability to participate (pt refused mobility stating back pain, RN aware)   [name] 09/17/2023, 8:33 AM [name], PT Acute Rehabilitation Services Office: [phone]

## 2023-09-17 NOTE — Plan of Care (Signed)
  Problem: Coping: Goal: Ability to adjust to condition or change in health will improve Outcome: Progressing   Problem: Fluid Volume: Goal: Ability to maintain a balanced intake and output will improve Outcome: Progressing   Problem: Metabolic: Goal: Ability to maintain appropriate glucose levels will improve Outcome: Progressing   Problem: Nutritional: Goal: Maintenance of adequate nutrition will improve Outcome: Progressing Goal: Progress toward achieving an optimal weight will improve Outcome: Progressing   Problem: Skin Integrity: Goal: Risk for impaired skin integrity will decrease Outcome: Progressing   Problem: Tissue Perfusion: Goal: Adequacy of tissue perfusion will improve Outcome: Progressing   Problem: Education: Goal: Knowledge of General Education information will improve Description: Including pain rating scale, medication(s)/side effects and non-pharmacologic comfort measures Outcome: Progressing   Problem: Health Behavior/Discharge Planning: Goal: Ability to manage health-related needs will improve Outcome: Progressing   Problem: Clinical Measurements: Goal: Ability to maintain clinical measurements within normal limits will improve Outcome: Progressing Goal: Will remain free from infection Outcome: Progressing Goal: Diagnostic test results will improve Outcome: Progressing Goal: Respiratory complications will improve Outcome: Progressing Goal: Cardiovascular complication will be avoided Outcome: Progressing   Problem: Activity: Goal: Risk for activity intolerance will decrease Outcome: Progressing   Problem: Nutrition: Goal: Adequate nutrition will be maintained Outcome: Progressing   Problem: Coping: Goal: Level of anxiety will decrease Outcome: Progressing   Problem: Elimination: Goal: Will not experience complications related to bowel motility Outcome: Progressing Goal: Will not experience complications related to urinary  retention Outcome: Progressing   Problem: Pain Managment: Goal: General experience of comfort will improve and/or be controlled Outcome: Progressing   Problem: Safety: Goal: Ability to remain free from injury will improve Outcome: Progressing   Problem: Skin Integrity: Goal: Risk for impaired skin integrity will decrease Outcome: Progressing   Problem: Education: Goal: Knowledge of disease and its progression will improve Outcome: Progressing   Problem: Fluid Volume: Goal: Compliance with measures to maintain balanced fluid volume will improve Outcome: Progressing   Problem: Health Behavior/Discharge Planning: Goal: Ability to manage health-related needs will improve Outcome: Progressing   Problem: Nutritional: Goal: Ability to make healthy dietary choices will improve Outcome: Progressing   Problem: Clinical Measurements: Goal: Complications related to the disease process, condition or treatment will be avoided or minimized Outcome: Progressing   Problem: Education: Goal: Understanding of CV disease, CV risk reduction, and recovery process will improve Outcome: Progressing   Problem: Activity: Goal: Ability to return to baseline activity level will improve Outcome: Progressing   Problem: Cardiovascular: Goal: Ability to achieve and maintain adequate cardiovascular perfusion will improve Outcome: Progressing   Problem: Health Behavior/Discharge Planning: Goal: Ability to safely manage health-related needs after discharge will improve Outcome: Progressing   Patient started on regular diet and able to come off D10 drip for blood sugar maintenance. Patient also got out of bed to chair for approximately 1 hr today.    Problem: Cardiovascular: Goal: Vascular access site(s) Level 0-1 will be maintained Outcome: Completed/Met  Patient's vascular access site is normal and has had no issues since sheath removal 5/13

## 2023-09-17 NOTE — Progress Notes (Signed)
 [facility] NEPHROLOGY PROGRESS NOTE  Assessment/ Plan: Pt is a 78 y.o. yo male with CAD status post CABG, underwent laparoscopic appendectomy on 5/7 developed postop ileus, AKI on CKD.  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  UA with pyuria and kidney ultrasound without hydronephrosis. No sign of renal recovery with worsening azotemia. First HD on 5/14, second HD on 5/15.  Received third HD 5/17 with 1.5 L UF, tolerated well.  Continues on TTS schedule.  Ended up getting into trouble fluid wise with delayed HD yesterday-  will try to decrease the d10 he is getting and give a big bolus dose of lasix  today-  plan for next HD in the AM Renal navigator is aware of arranging outpatient HD as he gets closer to discharge.  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  On 5/14, the AV fistula is unable to cannulate even with small needle 17G.  Using temp cath with plan for conversion to [facility] today with VVS, 5/22. VVS to follow as an outpatient regarding his AV fistula   # Appendicitis with possible abscess and postop ileus: s/p  antibiotics and drain per surgical team.  TPN for the nutrition.  Antibiotics held, continued clinical observation.  Surgery following.  # Acute blood loss anemia, CT angio negative for bleeding.  Transfuse as needed.  StartESA 5/22.  No IV iron until infectious issues clearly resolved.  # Metabolic acidosis: dc sodium bicarbonate , HD now.  # CKD-MBD, hyperphosphatemia: Phosphorus stable and with poor p.o. is off sevelamer , PTH level 337, continue to monitor.  # Hypokalemia: Managed with HD, monitor lab.   Subjective:  Seen and examined.  Events noted-  underwent placement of TDC yesterday-  then became acutely more SOB overnight-   had HD in the middle of the night-  removed 2000-  limited a little by hypotension-  interestingly does not seem that overloaded-  on [place] this AM 100 % sat Is focused on the fact that he is hurting in his back-  is getting d10 at 40 per hour  Objective Vital signs in last 24 hours: Vitals:   09/17/23 0530 09/17/23 0545 09/17/23 0600 09/17/23 0610  BP: (!) 96/55 (!) 91/51 (!) 87/52 (!) 104/54  Pulse: (!) 101 97 (!) 104 97  Resp: (!) 25 (!) 24 (!) 26 (!) 24  Temp:    98.3 F (36.8 C)  TempSrc:    Axillary  SpO2: 100% 100% 100% 100%  Weight:      Height:       Weight change:   Intake/Output Summary (Last 24 hours) at 09/17/2023 0727 Last data filed at 09/17/2023 0610 Gross per 24 hour  Intake 1497.28 ml  Output 2395 ml  Net -897.72 ml     Labs: RENAL PANEL Recent Labs  Lab 09/10/23 1050 09/11/23 0546 09/13/23 0559 09/14/23 0646 09/15/23 0920 09/16/23 0435 09/16/23 1744 09/16/23 2249 09/17/23 0141 09/17/23 0232  NA 139   < > 140 144 140 142 144 142 143 143  K 3.1*   < > 3.4* 3.8 3.9 3.9 4.6 4.5 4.7 4.5  CL 94*   < > 99 102 103 104 107 108  --  104  CO2 29   < > 28 26 26 23 23 23   --  22  GLUCOSE 195*   < > 209* 200* 204* 167* 171* 180*  --  202*  BUN 67*   < > 74* 91* 63* 83* 90* 93*  --  92*  CREATININE 5.32*   < > 4.59* 4.93* 3.54* 4.04* 4.30* 4.28*  --  4.18*  CALCIUM  8.4*   < > 8.5* 8.7* 8.7* 9.1 9.0 9.1  --  9.3  MG  --    < > 2.0  --  1.6* 1.7  --  2.2  --  2.3  PHOS 5.1*   < > 2.2* 3.5 3.4 3.5 4.7*  --   --  4.3  ALBUMIN  1.7*  --  1.7*  --  1.8* 1.8* 2.1*  --   --   --    < > = values in this interval not displayed.    Liver Function Tests: Recent Labs  Lab 09/13/23 0559 09/15/23 0920 09/16/23 0435 09/16/23 1744  AST 70*  --  102*  --   ALT 45*  --  105*  --   ALKPHOS 83  --  97  --   BILITOT 1.7*  --  0.8  --   PROT 5.9*  --  6.3*  --   ALBUMIN  1.7* 1.8* 1.8* 2.1*   No results for input(s): "LIPASE", "AMYLASE" in the last 168 hours.  No results  for input(s): "AMMONIA" in the last 168 hours. CBC: Recent Labs    01/18/23 1118 04/06/23 0910 09/13/23 0559 09/14/23 0646 09/16/23 0435 09/16/23 2249 09/17/23 0141  HGB  --    < > 8.7* 8.8* 8.5* 8.6* 7.8*  MCV  --    < > 100.0 99.6 102.3* 103.4*  --   VITAMINB12 565  --   --   --   --   --   --    < > = values in this interval not displayed.    Cardiac Enzymes: No results for input(s): "CKTOTAL", "CKMB", "CKMBINDEX", "TROPONINI" in the last 168 hours. CBG: Recent Labs  Lab 09/16/23 1436 09/16/23 1559 09/16/23 1939 09/17/23 0129 09/17/23 0342  GLUCAP 156* 163* 176* 168* 138*    Iron Studies: No results for input(s): "IRON", "TIBC", "TRANSFERRIN", "FERRITIN" in the last 72 hours. Studies/Results: DG Chest Port 1 View Result Date: 09/17/2023 CLINICAL DATA:  Acute respiratory failure EXAM: PORTABLE CHEST 1 VIEW COMPARISON:  Film from the previous day. FINDINGS: Cardiac shadow is stable. Dialysis catheter is again seen. Postsurgical changes are noted. Small pleural effusions are again seen but less prominent likely related to positioning. No focal confluent infiltrate is seen. IMPRESSION: Small effusions bilaterally. Electronically Signed   By: Violeta Grey M.D.   On: 09/17/2023 02:19   DG Chest Port 1V same Day Result Date: 09/16/2023 CLINICAL DATA:  Postop respiratory distress EXAM: PORTABLE CHEST 1 VIEW COMPARISON:  09/15/2023, 09/09/2023 FINDINGS: Right-sided central venous catheter tip at the right atrium. Sternotomy changes. Cardiomegaly with bilateral pleural effusions. Progressive airspace disease at the left base. Hazy bilateral opacity could be due to mild edema or pleural fluid. No pneumothorax IMPRESSION: Cardiomegaly with bilateral pleural effusions. Progressive airspace disease at the left base which may be due to atelectasis or pneumonia. Hazy bilateral opacity could be due to mild edema or pleural fluid. Electronically Signed   By: Esmeralda Hedge M.D.   On: 09/16/2023  16:57   US  EKG SITE RITE Result Date: 09/16/2023 If [facility] image not attached, placement could not be confirmed due to current cardiac rhythm.  HYBRID  OR IMAGING ([facility] ONLY) Result Date: 09/16/2023 There is no interpretation for this exam.  This order is for images obtained during a surgical procedure.  Please See "Surgeries" Tab for more information regarding the procedure.   NM Pulmonary Perfusion Result Date: 09/15/2023 CLINICAL DATA:  Dyspnea EXAM: NUCLEAR MEDICINE PERFUSION LUNG SCAN TECHNIQUE: Perfusion images were obtained in multiple projections after intravenous injection of radiopharmaceutical. Ventilation scans intentionally deferred if perfusion scan and chest x-ray adequate for interpretation during COVID 19 epidemic. RADIOPHARMACEUTICALS:  4.2 mCi Tc-41m MAA IV COMPARISON:  None Available. FINDINGS: There are small subsegmental defects within the posterior basal left lower lobe which are not segmental in nature and likely correspond to areas of rounded atelectasis noted on CT examination of 12/12/2020. Tiny defect within the lateral right mid lung zone is not segmental and may relate to fluid within the fissure. No segmental perfusion defects are identified. IMPRESSION: No evidence of pulmonary embolism Electronically Signed   By: Worthy Heads M.D.   On: 09/15/2023 19:40   DG CHEST PORT 1 VIEW Result Date: 09/15/2023 CLINICAL DATA:  Dyspnea. EXAM: PORTABLE CHEST 1 VIEW COMPARISON:  Sep 09, 2023. FINDINGS: Stable cardiomediastinal silhouette. Status post coronary bypass graft. Nasogastric tube is seen entering stomach. Right internal jugular catheter is unchanged. Bibasilar atelectasis is noted with small pleural effusions. IMPRESSION: Mild bibasilar atelectasis with small pleural effusions. Electronically Signed   By: Rosalene Colon M.D.   On: 09/15/2023 14:10     Medications: Infusions:  amiodarone      dextrose  40 mL/hr at 09/17/23 0600    Scheduled Medications:   Chlorhexidine  Gluconate Cloth  6 each Topical Q0600   Chlorhexidine  Gluconate Cloth  6 each Topical Q0600   darbepoetin (ARANESP ) injection - DIALYSIS  100 mcg Subcutaneous Q Thu-1800   fluticasone  furoate-vilanterol  1 puff Inhalation Daily   heparin  injection (subcutaneous)  5,000 Units Subcutaneous Q8H   insulin  aspart  0-20 Units Subcutaneous Q4H   sodium chloride  flush  3 mL Intravenous Q12H    have reviewed scheduled and prn medications.  Physical Exam: General:NAD, he has NG tube. Heart:RRR, s1s2 nl Lungs:clear b/l, no crackle Abdomen:soft, drain present Extremities:No edema Neurology: Alert, awake and following commands Vascular Access: AV fistula is quite deep, thrill positive. Temp HD cath.  Ornella Coderre A Nanci Lakatos 09/17/2023,7:27 AM  LOS: 17 days

## 2023-09-18 DIAGNOSIS — N184 Chronic kidney disease, stage 4 (severe): Secondary | ICD-10-CM | POA: Diagnosis not present

## 2023-09-18 DIAGNOSIS — N179 Acute kidney failure, unspecified: Secondary | ICD-10-CM | POA: Diagnosis not present

## 2023-09-18 DIAGNOSIS — K37 Unspecified appendicitis: Secondary | ICD-10-CM | POA: Diagnosis not present

## 2023-09-18 DIAGNOSIS — I4892 Unspecified atrial flutter: Secondary | ICD-10-CM | POA: Diagnosis not present

## 2023-09-18 DIAGNOSIS — D62 Acute posthemorrhagic anemia: Secondary | ICD-10-CM | POA: Diagnosis not present

## 2023-09-18 LAB — RENAL FUNCTION PANEL
Albumin: 2.4 g/dL — ABNORMAL LOW (ref 3.5–5.0)
Anion gap: 17 — ABNORMAL HIGH (ref 5–15)
BUN: 51 mg/dL — ABNORMAL HIGH (ref 8–23)
CO2: 25 mmol/L (ref 22–32)
Calcium: 8.8 mg/dL — ABNORMAL LOW (ref 8.9–10.3)
Chloride: 94 mmol/L — ABNORMAL LOW (ref 98–111)
Creatinine, Ser: 3.44 mg/dL — ABNORMAL HIGH (ref 0.61–1.24)
GFR, Estimated: 18 mL/min — ABNORMAL LOW (ref >60)
Glucose, Bld: 229 mg/dL — ABNORMAL HIGH (ref 70–99)
Phosphorus: 4.9 mg/dL — ABNORMAL HIGH (ref 2.5–4.6)
Potassium: 3.4 mmol/L — ABNORMAL LOW (ref 3.5–5.1)
Sodium: 136 mmol/L (ref 135–145)

## 2023-09-18 LAB — GLUCOSE, CAPILLARY
Glucose-Capillary: 114 mg/dL — ABNORMAL HIGH (ref 70–99)
Glucose-Capillary: 112 mg/dL — ABNORMAL HIGH (ref 70–99)
Glucose-Capillary: 114 mg/dL — ABNORMAL HIGH (ref 70–99)
Glucose-Capillary: 139 mg/dL — ABNORMAL HIGH (ref 70–99)

## 2023-09-18 LAB — CBC
HCT: 25.2 % — ABNORMAL LOW (ref 39.0–52.0)
Hemoglobin: 8 g/dL — ABNORMAL LOW (ref 13.0–17.0)
MCH: 32.1 pg (ref 26.0–34.0)
MCHC: 31.7 g/dL (ref 30.0–36.0)
MCV: 101.2 fL — ABNORMAL HIGH (ref 80.0–100.0)
Platelets: 277 10*3/uL (ref 150–400)
RBC: 2.49 MIL/uL — ABNORMAL LOW (ref 4.22–5.81)
RDW: 16.2 % — ABNORMAL HIGH (ref 11.5–15.5)
WBC: 13.2 10*3/uL — ABNORMAL HIGH (ref 4.0–10.5)
nRBC: 0 % (ref 0.0–0.2)

## 2023-09-18 LAB — CULTURE, BLOOD (ROUTINE X 2)
Culture: NO GROWTH
Culture: NO GROWTH
Special Requests: ADEQUATE
Special Requests: ADEQUATE

## 2023-09-18 LAB — HEPATIC FUNCTION PANEL
ALT: 39 U/L (ref 0–44)
AST: 47 U/L — ABNORMAL HIGH (ref 15–41)
Albumin: 2.4 g/dL — ABNORMAL LOW (ref 3.5–5.0)
Alkaline Phosphatase: 99 U/L (ref 38–126)
Bilirubin, Direct: 0.2 mg/dL (ref 0.0–0.2)
Indirect Bilirubin: 0.5 mg/dL (ref 0.3–0.9)
Total Bilirubin: 0.7 mg/dL (ref 0.0–1.2)
Total Protein: 6.7 g/dL (ref 6.5–8.1)

## 2023-09-18 MED ORDER — INSULIN ASPART 100 UNIT/ML IJ SOLN
0.0000 [IU] | Freq: Three times a day (TID) | INTRAMUSCULAR | Status: DC
  Administered 2023-09-19 (×2): 3 [IU] via SUBCUTANEOUS
  Administered 2023-09-20: 4 [IU] via SUBCUTANEOUS
  Administered 2023-09-20 – 2023-09-29 (×6): 3 [IU] via SUBCUTANEOUS

## 2023-09-18 MED ORDER — HYDROMORPHONE HCL 1 MG/ML IJ SOLN
INTRAMUSCULAR | Status: AC
Start: 2023-09-18 — End: ?
  Filled 2023-09-18: qty 1

## 2023-09-18 NOTE — Progress Notes (Signed)
 Will follow-up in 1 month for fistula ultrasound, possible revision versus ligation and new access creation. I have scheduled his follow-up.

## 2023-09-18 NOTE — Progress Notes (Signed)
 General Surgery Follow Up Note  Subjective:    Overnight Issues:   Endorsing crampy lower abdominal pain but tolerating a regular diet and having bowel function. Continues to experience dyspnea.   Objective:  Vital signs for last 24 hours: Temp:  [97.1 F (36.2 C)-98.7 F (37.1 C)] 98.7 F (37.1 C) (05/24 0341) Pulse Rate:  [97-110] 104 (05/24 0700) Resp:  [15-29] 21 (05/24 0700) BP: (94-137)/(44-70) 117/70 (05/24 0700) SpO2:  [96 %-100 %] 100 % (05/24 0700)  Hemodynamic parameters for last 24 hours:    Intake/Output from previous day: 05/23 0701 - 05/24 0700 In: 723.4 [I.V.:657.4; IV Piggyback:66] Out: 375 [Urine:375]  Intake/Output this shift: No intake/output data recorded.  Vent settings for last 24 hours:    Physical Exam:  Gen: laying flat. Appears comfortable Neuro: follows commands, alert, communicative HEENT: PERRL Neck: supple CV: HR 100-110 Pulm: on nasal cannula Abd: soft, mid distention, non tender to palpatio GU: HD, foley, clear urine Extr: wwp, no edema  Results for orders placed or performed during the hospital encounter of 08/30/23 (from the past 24 hours)  Glucose, capillary     Status: Abnormal   Collection Time: 09/17/23 11:29 AM  Result Value Ref Range   Glucose-Capillary 175 (H) 70 - 99 mg/dL  Glucose, capillary     Status: Abnormal   Collection Time: 09/17/23  3:37 PM  Result Value Ref Range   Glucose-Capillary 164 (H) 70 - 99 mg/dL  Glucose, capillary     Status: None   Collection Time: 09/17/23  8:22 PM  Result Value Ref Range   Glucose-Capillary 90 70 - 99 mg/dL  Glucose, capillary     Status: Abnormal   Collection Time: 09/17/23 11:55 PM  Result Value Ref Range   Glucose-Capillary 116 (H) 70 - 99 mg/dL  Renal function panel     Status: Abnormal   Collection Time: 09/18/23  2:35 AM  Result Value Ref Range   Sodium 136 135 - 145 mmol/L   Potassium 3.4 (L) 3.5 - 5.1 mmol/L   Chloride 94 (L) 98 - 111 mmol/L   CO2 25 22 - 32  mmol/L   Glucose, Bld 229 (H) 70 - 99 mg/dL   BUN 51 (H) 8 - 23 mg/dL   Creatinine, Ser 1.61 (H) 0.61 - 1.24 mg/dL   Calcium  8.8 (L) 8.9 - 10.3 mg/dL   Phosphorus 4.9 (H) 2.5 - 4.6 mg/dL   Albumin  2.4 (L) 3.5 - 5.0 g/dL   GFR, Estimated 18 (L) >60 mL/min   Anion gap 17 (H) 5 - 15  CBC     Status: Abnormal   Collection Time: 09/18/23  2:35 AM  Result Value Ref Range   WBC 13.2 (H) 4.0 - 10.5 K/uL   RBC 2.49 (L) 4.22 - 5.81 MIL/uL   Hemoglobin 8.0 (L) 13.0 - 17.0 g/dL   HCT 09.6 (L) 04.5 - 40.9 %   MCV 101.2 (H) 80.0 - 100.0 fL   MCH 32.1 26.0 - 34.0 pg   MCHC 31.7 30.0 - 36.0 g/dL   RDW 81.1 (H) 91.4 - 78.2 %   Platelets 277 150 - 400 K/uL   nRBC 0.0 0.0 - 0.2 %  Hepatic function panel     Status: Abnormal   Collection Time: 09/18/23  2:35 AM  Result Value Ref Range   Total Protein 6.7 6.5 - 8.1 g/dL   Albumin  2.4 (L) 3.5 - 5.0 g/dL   AST 47 (H) 15 - 41 U/L  ALT 39 0 - 44 U/L   Alkaline Phosphatase 99 38 - 126 U/L   Total Bilirubin 0.7 0.0 - 1.2 mg/dL   Bilirubin, Direct 0.2 0.0 - 0.2 mg/dL   Indirect Bilirubin 0.5 0.3 - 0.9 mg/dL  Glucose, capillary     Status: Abnormal   Collection Time: 09/18/23  3:30 AM  Result Value Ref Range   Glucose-Capillary 114 (H) 70 - 99 mg/dL    Assessment & Plan: The plan of care was discussed with the bedside dialysis nurse for the day, who is in agreement with this plan and no additional concerns were raised.   Present on Admission:  Appendicitis  HTN (hypertension)  Ischemic cardiomyopathy  Acute renal failure superimposed on stage 4 chronic kidney disease (HCC)  CAD (coronary artery disease)  (Resolved) Acute appendicitis  ABLA (acute blood loss anemia)    LOS: 18 days   Additional comments:I reviewed the patient's new clinical lab test results.   and I reviewed the patients new imaging test results.    POD 16 - s/p lap appy with JP drain placement for perforated appendicitis [name] 5/6  - Ileus appears to have resolved.  Tolerating PO with bowel function - Continue regular diet - WBC stable at 13. AF and HDS. No indication for abx - encourage OOB, ambulation      FEN - REgular Lines/tubes - tunneled HD cath 5/22 VVS, PIV x2 RUE, LUE w/ AV Fistula (non-functional) Foley - in place, clear yellow urine VTE - heparin /plavix  on hold ID - Zosyn  d/c 5/21   Armond Bertin, MD [facility] Surgery Please see Amion for pager number during day hours 7:00am-4:30pm       09/18/2023  *Care during the described time interval was provided by me. I have reviewed this patient's available data, including medical history, events of note, physical examination and test results as part of my evaluation.

## 2023-09-18 NOTE — Progress Notes (Signed)
 [facility] PROGRESS NOTE    Progress Note  DERIUS GHOSH  ZOX:[id number] DOB: May 11, 1945 DOA: 08/30/2023 PCP: [name], MD     Brief Narrative:   ARTH NICASTRO is an 78 y.o. male past medical history of CABG postop CVA, chronic kidney disease stage IV who underwent laparoscopic appendectomy by general surgery on 08/31/2023 developed a postoperative ileus, JP drain in place, and NG tube to suction developed acute kidney injury, nephrology was consulted and is undergoing hemodialysis, we were unable to use his old AV fistula so he underwent HD catheter placement on the right by IR on 09/08/2022, CT scan of the abdomen pelvis on 09/08/2022 showed GB drainage with hematoma and persistent postop ileus.  NG tube has since been removed.  A  Assessment/Plan:   Acute perforated Appendicitis abscess, status post surgical intervention 08/31/2023 and now has developed a postop ileus: Previous 14-day course of IV Zosyn  blood cultures remain negative. JP drain with minimal serosanguineous output. Tolerating clear liquid diet having bowel movements. Out of bed to chair, working with physical therapy. Repeated CT scan on 09/08/2022 showed complex 5 x 11 x 4 complex fluid collection. ID was consulted due to persistent fever and leukocytosis on 09/13/2023 despite IV antibiotics General Surgery is more concerned about a hematoma rather than abscess. ID recommended to hold on antibiotics.  She received 1 dose of IV Ancef  on 09/16/2023 for surgical temporary dialysis catheter placement. Further management per surgery. Passing gas just had a bowel movement this morning. Decreased appetite this morning.  Acute renal failure superimposed on stage 4 chronic kidney disease (HCC) With a baseline creatinine around 3.2-3.5. Probably developed ATN in the setting of hypotension and blood loss, nephrology was consulted HD catheter was placed now on dialysis. Vascular surgery was consulted fistulogram  performed 09/07/2023 with balloon venoplasty to the left fistula. Underwent right IJ temporary catheter placement by IR on 09/08/2022.  Removed on 09/16/2023. Vascular recommended tunneled dialysis catheter placement on 09/16/2023. Continue dialysis per nephrology. First hemodialysis wound 09/08/2023. The plan for HD on 09/18/2023. Further management per nephrology.  Acute blood loss anemia: Hemoglobin around 10 there was a drop to 7.5, s/p 1 unit packed red blood cells his hemoglobin has remained relatively stable. Hemoglobin this morning is 8.0.  A-fib with RVR: Started on IV diltiazem  became hypotensive, now on amiodarone  drip. Chads Vascor greater than 3 Converted to sinus rhythm. Consult cardiology not a candidate for anticoagulation at this time. Started on oral metoprolol . Further management per cardiology.  Essential hypertension: Currently on IV metoprolol  every 6 hours, and hydralazine  10 every 6 hours as needed.  Diabetes mellitus type 2: TPN had held as right IJ was discontinued. Now on sliding scale in the blood glucose fairly controlled. Once we start TPN can start giving him insulin  through his TPN.  CAD: Evaluated by cardiology no further ischemic evaluation.  Thrombocytosis: Likely reactive.  Electrolyte imbalance hypokalemia/hypomagnesemia: Potassium is 3.4 phosphorus 4.9. Continue correction per renal.  Panic attack: He required 2 to 3 L of oxygen. VQ scan showed very low probability.   Severe protein caloric malnutrition: Start TPN once central line catheter was placed by IR. IR consulted for left central line catheter for TPN.  Back pain: Increase Dilaudid .   DVT prophylaxis: heparin  Family Communication:none Status is: Inpatient Remains inpatient appropriate because: Acute appendicitis now with acute kidney injury    Code Status:     Code Status Orders  (From admission, onward)  Start     Ordered   08/31/23 0416  Full code   Continuous       Question:  By:  Answer:  Consent: discussion documented in EHR   08/31/23 0416           Code Status History     Date Active Date Inactive Code Status Order ID Comments User Context   12/12/2020 0212 12/16/2020 1900 Full Code [id number]  Dea Evert, DO ED   10/03/2020 1828 10/07/2020 1752 Full Code [id number]  Ozell Blunt, MD Inpatient   08/09/2019 1913 10/18/2019 1749 Full Code [id number]  Knox Perl, MD ED   09/27/2014 1331 10/01/2014 2151 Full Code [id number]  Annamarie Kid, MD Inpatient   02/27/2014 1233 02/28/2014 1432 Full Code [id number]  Knox Perl, MD Inpatient         IV Access:   Peripheral IV   Procedures and diagnostic studies:   ECHOCARDIOGRAM COMPLETE Result Date: 09/17/2023    ECHOCARDIOGRAM REPORT   Patient Name:   SHIN LAMOUR Date of Exam: 09/17/2023 Medical Rec #:  [id number]       Height:       69.0 in Accession #:    [id number]      Weight:       197.5 lb Date of Birth:  [birth year]      BSA:          2.055 m Patient Age:    77 years        BP:           137/66 mmHg Patient Gender: M               HR:           101 bpm. Exam Location:  Inpatient Procedure: 2D Echo, Cardiac Doppler, Color Doppler and Intracardiac            Opacification Agent (Both Spectral and Color Flow Doppler were            utilized during procedure). Indications:    Atrial Flutter  History:        Patient has prior history of Echocardiogram examinations. CHF                 and CABG x4, CAD, Signs/Symptoms:Shortness of Breath; Risk                 Factors:Former Smoker, Hypertension and Diabetes.  Sonographer:    Willey Harrier Referring Phys: [id number] SUBRINA SUNDIL IMPRESSIONS  1. Left ventricular ejection fraction, by estimation, is 55 to 60%. The left ventricle has normal function. The left ventricle has no regional wall motion abnormalities. There is moderate left ventricular hypertrophy. Left ventricular diastolic parameters were normal.  2. Right ventricular systolic  function is normal. The right ventricular size is normal.  3. The mitral valve is normal in structure. No evidence of mitral valve regurgitation. No evidence of mitral stenosis.  4. The aortic valve is tricuspid. Aortic valve regurgitation is not visualized. Aortic valve sclerosis/calcification is present, without any evidence of aortic stenosis.  5. The inferior vena cava is normal in size with greater than 50% respiratory variability, suggesting right atrial pressure of 3 mmHg. FINDINGS  Left Ventricle: Left ventricular ejection fraction, by estimation, is 55 to 60%. The left ventricle has normal function. The left ventricle has no regional wall motion abnormalities. Definity contrast agent was given IV to delineate the left ventricular  endocardial borders. The left ventricular internal cavity size was  normal in size. There is moderate left ventricular hypertrophy. Left ventricular diastolic parameters were normal. Right Ventricle: The right ventricular size is normal. Right ventricular systolic function is normal. Left Atrium: Left atrial size was normal in size. Right Atrium: Right atrial size was normal in size. Pericardium: There is no evidence of pericardial effusion. Mitral Valve: The mitral valve is normal in structure. No evidence of mitral valve regurgitation. No evidence of mitral valve stenosis. MV peak gradient, 3.4 mmHg. The mean mitral valve gradient is 2.0 mmHg. Tricuspid Valve: The tricuspid valve is normal in structure. Tricuspid valve regurgitation is trivial. No evidence of tricuspid stenosis. Aortic Valve: The aortic valve is tricuspid. Aortic valve regurgitation is not visualized. Aortic valve sclerosis/calcification is present, without any evidence of aortic stenosis. Aortic valve peak gradient measures 10.5 mmHg. Pulmonic Valve: The pulmonic valve was normal in structure. Pulmonic valve regurgitation is trivial. No evidence of pulmonic stenosis. Aorta: The aortic root is normal in size and  structure. Venous: The inferior vena cava is normal in size with greater than 50% respiratory variability, suggesting right atrial pressure of 3 mmHg. IAS/Shunts: No atrial level shunt detected by color flow Doppler.  LEFT VENTRICLE PLAX 2D LVIDd:         4.00 cm   Diastology LVIDs:         2.80 cm   LV e' medial:    8.92 cm/s LV PW:         1.40 cm   LV E/e' medial:  9.7 LV IVS:        1.50 cm   LV e' lateral:   10.80 cm/s LVOT diam:     1.90 cm   LV E/e' lateral: 8.0 LV SV:         56 LV SV Index:   27 LVOT Area:     2.84 cm  RIGHT VENTRICLE             IVC RV Basal diam:  3.70 cm     IVC diam: 1.40 cm RV S prime:     11.30 cm/s TAPSE (M-mode): 1.3 cm LEFT ATRIUM             Index        RIGHT ATRIUM           Index LA Vol (A2C):   48.1 ml 23.40 ml/m  RA Area:     12.80 cm LA Vol (A4C):   36.6 ml 17.81 ml/m  RA Volume:   26.40 ml  12.85 ml/m LA Biplane Vol: 44.0 ml 21.41 ml/m  AORTIC VALVE AV Area (Vmax): 1.93 cm AV Vmax:        162.00 cm/s AV Peak Grad:   10.5 mmHg LVOT Vmax:      110.00 cm/s LVOT Vmean:     70.700 cm/s LVOT VTI:       0.199 m  AORTA Ao Root diam: 3.10 cm Ao Asc diam:  2.90 cm MITRAL VALVE MV Area (PHT): 3.72 cm    SHUNTS MV Area VTI:   3.09 cm    Systemic VTI:  0.20 m MV Peak grad:  3.4 mmHg    Systemic Diam: 1.90 cm MV Mean grad:  2.0 mmHg MV Vmax:       0.92 m/s MV Vmean:      77.1 cm/s MV Decel Time: 204 msec MV E velocity: 86.50 cm/s MV A velocity: 96.40 cm/s MV E/A ratio:  0.90 Alexandria Angel MD Electronically signed by Alexandria Angel MD  Signature Date/Time: 09/17/2023/1:28:18 PM    Final    DG Abd 1 View Result Date: 09/17/2023 CLINICAL DATA:  Ileus.  Abdominal pain. EXAM: ABDOMEN - 1 VIEW COMPARISON:  CT 09/13/2023 FINDINGS: Gaseous small bowel distention in the central abdomen is not significantly changed compared to recent CT. No small volume of formed stool in the right colon. The catheter in the pelvis on prior exam is not definitively seen. Vascular calcifications.  IMPRESSION: Gaseous small bowel distention in the central abdomen is not significantly changed compared to recent CT. Electronically Signed   By: Chadwick Colonel M.D.   On: 09/17/2023 11:25   DG Chest Port 1 View Result Date: 09/17/2023 CLINICAL DATA:  Acute respiratory failure EXAM: PORTABLE CHEST 1 VIEW COMPARISON:  Film from the previous day. FINDINGS: Cardiac shadow is stable. Dialysis catheter is again seen. Postsurgical changes are noted. Small pleural effusions are again seen but less prominent likely related to positioning. No focal confluent infiltrate is seen. IMPRESSION: Small effusions bilaterally. Electronically Signed   By: Violeta Grey M.D.   On: 09/17/2023 02:19   DG Chest Port 1V same Day Result Date: 09/16/2023 CLINICAL DATA:  Postop respiratory distress EXAM: PORTABLE CHEST 1 VIEW COMPARISON:  09/15/2023, 09/09/2023 FINDINGS: Right-sided central venous catheter tip at the right atrium. Sternotomy changes. Cardiomegaly with bilateral pleural effusions. Progressive airspace disease at the left base. Hazy bilateral opacity could be due to mild edema or pleural fluid. No pneumothorax IMPRESSION: Cardiomegaly with bilateral pleural effusions. Progressive airspace disease at the left base which may be due to atelectasis or pneumonia. Hazy bilateral opacity could be due to mild edema or pleural fluid. Electronically Signed   By: Esmeralda Hedge M.D.   On: 09/16/2023 16:57   US  EKG SITE RITE Result Date: 09/16/2023 If [facility] image not attached, placement could not be confirmed due to current cardiac rhythm.  HYBRID OR IMAGING ([facility] ONLY) Result Date: 09/16/2023 There is no interpretation for this exam.  This order is for images obtained during a surgical procedure.  Please See "Surgeries" Tab for more information regarding the procedure.     Medical Consultants:   None.   Subjective:    DUWARD ALLBRITTON back pain is controlled.  Objective:    Vitals:   09/18/23 0600 09/18/23  0700 09/18/23 0800 09/18/23 0810  BP: 127/63 117/70 (!) 117/59   Pulse: (!) 107 (!) 104 (!) 103 (!) 102  Resp: (!) 21 (!) 21 18 (!) 21  Temp:    98.4 F (36.9 C)  TempSrc:    Oral  SpO2: 100% 100% 100% 100%  Weight:      Height:       SpO2: 100 % O2 Flow Rate (L/min): 3 L/min FiO2 (%): 40 %   Intake/Output Summary (Last 24 hours) at 09/18/2023 0922 Last data filed at 09/18/2023 0700 Gross per 24 hour  Intake 580.26 ml  Output 175 ml  Net 405.26 ml   Filed Weights   09/16/23 0702 09/16/23 1203 09/17/23 0500  Weight: 86.3 kg 86.3 kg 89.6 kg    Exam: General exam: In no acute distress. Respiratory system: Good air movement and clear to auscultation. Cardiovascular system: S1 & S2 heard, RRR. No JVD. Gastrointestinal system: Abdomen is nondistended, soft and nontender.  Extremities: No pedal edema. Skin: No rashes, lesions or ulcers Psychiatry: Judgement and insight appear normal. Mood & affect appropriate.  Data Reviewed:    Labs: Basic Metabolic Panel: Recent Labs  Lab 09/13/23 0559 09/14/23 1610  09/15/23 0920 09/16/23 0435 09/16/23 1744 09/16/23 2249 09/17/23 0141 09/17/23 0232 09/18/23 0235  NA 140   < > 140 142 144 142 143 143 136  K 3.4*   < > 3.9 3.9 4.6 4.5 4.7 4.5 3.4*  CL 99   < > 103 104 107 108  --  104 94*  CO2 28   < > 26 23 23 23   --  22 25  GLUCOSE 209*   < > 204* 167* 171* 180*  --  202* 229*  BUN 74*   < > 63* 83* 90* 93*  --  92* 51*  CREATININE 4.59*   < > 3.54* 4.04* 4.30* 4.28*  --  4.18* 3.44*  CALCIUM  8.5*   < > 8.7* 9.1 9.0 9.1  --  9.3 8.8*  MG 2.0  --  1.6* 1.7  --  2.2  --  2.3  --   PHOS 2.2*   < > 3.4 3.5 4.7*  --   --  4.3 4.9*   < > = values in this interval not displayed.   GFR Estimated Creatinine Clearance: 20.2 mL/min (A) (by C-G formula based on SCr of 3.44 mg/dL (H)). Liver Function Tests: Recent Labs  Lab 09/13/23 0559 09/15/23 0920 09/16/23 0435 09/16/23 1744 09/17/23 0232 09/18/23 0235  AST 70*  --  102*   --  70* 47*  ALT 45*  --  105*  --  73* 39  ALKPHOS 83  --  97  --  108 99  BILITOT 1.7*  --  0.8  --  0.6 0.7  PROT 5.9*  --  6.3*  --  7.2 6.7  ALBUMIN  1.7* 1.8* 1.8* 2.1* 2.4* 2.4*  2.4*   No results for input(s): "LIPASE", "AMYLASE" in the last 168 hours. No results for input(s): "AMMONIA" in the last 168 hours. Coagulation profile No results for input(s): "INR", "PROTIME" in the last 168 hours. COVID-19 Labs  No results for input(s): "DDIMER", "FERRITIN", "LDH", "CRP" in the last 72 hours.  Lab Results  Component Value Date   SARSCOV2NAA NEGATIVE 12/11/2020   SARSCOV2NAA NEGATIVE 10/03/2020   SARSCOV2NAA NEGATIVE 10/17/2019   SARSCOV2NAA NEGATIVE 10/13/2019    CBC: Recent Labs  Lab 09/13/23 0559 09/14/23 0646 09/16/23 0435 09/16/23 2249 09/17/23 0141 09/18/23 0235  WBC 12.8* 13.2* 11.8* 13.4*  --  13.2*  NEUTROABS 9.5*  --   --   --   --   --   HGB 8.7* 8.8* 8.5* 8.6* 7.8* 8.0*  HCT 26.9* 27.3* 26.9* 27.2* 23.0* 25.2*  MCV 100.0 99.6 102.3* 103.4*  --  101.2*  PLT 375 371 322 317  --  277   Cardiac Enzymes: No results for input(s): "CKTOTAL", "CKMB", "CKMBINDEX", "TROPONINI" in the last 168 hours. BNP (last 3 results) No results for input(s): "PROBNP" in the last 8760 hours. CBG: Recent Labs  Lab 09/17/23 1537 09/17/23 2022 09/17/23 2355 09/18/23 0330 09/18/23 0809  GLUCAP 164* 90 116* 114* 112*   D-Dimer: No results for input(s): "DDIMER" in the last 72 hours. Hgb A1c: No results for input(s): "HGBA1C" in the last 72 hours. Lipid Profile: No results for input(s): "CHOL", "HDL", "LDLCALC", "TRIG", "CHOLHDL", "LDLDIRECT" in the last 72 hours. Thyroid  function studies: Recent Labs    09/17/23 0232  TSH 1.378   Anemia work up: No results for input(s): "VITAMINB12", "FOLATE", "FERRITIN", "TIBC", "IRON", "RETICCTPCT" in the last 72 hours. Sepsis Labs: Recent Labs  Lab 09/14/23 1610 09/16/23 0435 09/16/23 2249 09/18/23 0235  WBC 13.2* 11.8*  13.4* 13.2*   Microbiology Recent Results (from the past 240 hours)  Culture, blood (Routine X 2) w Reflex to ID Panel     Status: None   Collection Time: 09/13/23 10:11 AM   Specimen: BLOOD RIGHT HAND  Result Value Ref Range Status   Specimen Description BLOOD RIGHT HAND  Final   Special Requests   Final    BOTTLES DRAWN AEROBIC AND ANAEROBIC Blood Culture adequate volume   Culture   Final    NO GROWTH 5 DAYS Performed at [facility], [address] [place], [place] [address]    Report Status 09/18/2023 FINAL  Final  Culture, blood (Routine X 2) w Reflex to ID Panel     Status: None   Collection Time: 09/13/23 10:11 AM   Specimen: BLOOD RIGHT HAND  Result Value Ref Range Status   Specimen Description BLOOD RIGHT HAND  Final   Special Requests   Final    BOTTLES DRAWN AEROBIC AND ANAEROBIC Blood Culture adequate volume   Culture   Final    NO GROWTH 5 DAYS Performed at [facility], [address], [place], [place] [address]    Report Status 09/18/2023 FINAL  Final     Medications:    Chlorhexidine  Gluconate Cloth  6 each Topical Q0600   darbepoetin (ARANESP ) injection - DIALYSIS  100 mcg Subcutaneous Q Thu-1800   feeding supplement  1 Container Oral TID BM   fluticasone  furoate-vilanterol  1 puff Inhalation Daily   heparin  injection (subcutaneous)  5,000 Units Subcutaneous Q8H   insulin  aspart  0-20 Units Subcutaneous Q4H   sodium chloride  flush  3 mL Intravenous Q12H   Continuous Infusions:  amiodarone  30 mg/hr (09/18/23 0700)      LOS: 18 days   Macdonald Savoy  [facility]  09/18/2023, 9:22 AM

## 2023-09-18 NOTE — Progress Notes (Signed)
 Rounding Note    Patient Name: Christian [name] Date of Encounter: 09/18/2023  [facility] Cardiologist: Knox Perl, MD   Subjective   No CP or dyspnea  Inpatient Medications    Scheduled Meds:  Chlorhexidine  Gluconate Cloth  6 each Topical Q0600   darbepoetin (ARANESP ) injection - DIALYSIS  100 mcg Subcutaneous Q Thu-1800   feeding supplement  1 Container Oral TID BM   fluticasone  furoate-vilanterol  1 puff Inhalation Daily   heparin  injection (subcutaneous)  5,000 Units Subcutaneous Q8H   insulin  aspart  0-20 Units Subcutaneous Q4H   sodium chloride  flush  3 mL Intravenous Q12H   Continuous Infusions:  amiodarone  30 mg/hr (09/18/23 0700)   PRN Meds: acetaminophen , albuterol , dextrose , HYDROmorphone  (DILAUDID ) injection, LORazepam , methocarbamol  (ROBAXIN ) injection, metoprolol  tartrate, naLOXone  (NARCAN )  injection, ondansetron  (ZOFRAN ) IV, simethicone , sodium chloride  flush   Vital Signs    Vitals:   09/18/23 0600 09/18/23 0700 09/18/23 0800 09/18/23 0810  BP: 127/63 117/70 (!) 117/59   Pulse: (!) 107 (!) 104 (!) 103 (!) 102  Resp: (!) 21 (!) 21 18 (!) 21  Temp:    98.4 F (36.9 C)  TempSrc:    Oral  SpO2: 100% 100% 100% 100%  Weight:      Height:        Intake/Output Summary (Last 24 hours) at 09/18/2023 0817 Last data filed at 09/18/2023 0700 Gross per 24 hour  Intake 650.56 ml  Output 175 ml  Net 475.56 ml      09/17/2023    5:00 AM 09/16/2023   12:03 PM 09/16/2023    7:02 AM  Last 3 Weights  Weight (lbs) 197 lb 8.5 oz 190 lb 4.1 oz 190 lb 4.1 oz  Weight (kg) 89.6 kg 86.3 kg 86.3 kg      Telemetry    Sinus - Personally Reviewed   Physical Exam   GEN: No acute distress.   Neck: No JVD Cardiac: RRR, no murmurs, rubs, or gallops.  Respiratory: Clear to auscultation bilaterally. GI: Soft, nontender, non-distended  MS: No edema Neuro:  Nonfocal  Psych: Normal affect   Labs     Chemistry Recent Labs  Lab 09/16/23 0435  09/16/23 1744 09/16/23 2249 09/17/23 0141 09/17/23 0232 09/18/23 0235  NA 142 144 142 143 143 136  K 3.9 4.6 4.5 4.7 4.5 3.4*  CL 104 107 108  --  104 94*  CO2 23 23 23   --  22 25  GLUCOSE 167* 171* 180*  --  202* 229*  BUN 83* 90* 93*  --  92* 51*  CREATININE 4.04* 4.30* 4.28*  --  4.18* 3.44*  CALCIUM  9.1 9.0 9.1  --  9.3 8.8*  MG 1.7  --  2.2  --  2.3  --   PROT 6.3*  --   --   --  7.2 6.7  ALBUMIN  1.8* 2.1*  --   --  2.4* 2.4*  2.4*  AST 102*  --   --   --  70* 47*  ALT 105*  --   --   --  73* 39  ALKPHOS 97  --   --   --  108 99  BILITOT 0.8  --   --   --  0.6 0.7  GFRNONAA 15* 14* 14*  --  14* 18*  ANIONGAP 15 14 11   --  17* 17*    Lipids  Recent Labs  Lab 09/13/23 0559  TRIG 228*    Hematology  Recent Labs  Lab 09/16/23 0435 09/16/23 2249 09/17/23 0141 09/18/23 0235  WBC 11.8* 13.4*  --  13.2*  RBC 2.63* 2.63*  --  2.49*  HGB 8.5* 8.6* 7.8* 8.0*  HCT 26.9* 27.2* 23.0* 25.2*  MCV 102.3* 103.4*  --  101.2*  MCH 32.3 32.7  --  32.1  MCHC 31.6 31.6  --  31.7  RDW 16.2* 16.2*  --  16.2*  PLT 322 317  --  277   Thyroid   Recent Labs  Lab 09/17/23 0232  TSH 1.378     Radiology    ECHOCARDIOGRAM COMPLETE Result Date: 09/17/2023    ECHOCARDIOGRAM REPORT   Patient Name:   JAYCEE PELZER Date of Exam: 09/17/2023 Medical Rec #:  [id number]       Height:       69.0 in Accession #:    [id number]      Weight:       197.5 lb Date of Birth:  11/22/1945      BSA:          2.055 m Patient Age:    77 years        BP:           137/66 mmHg Patient Gender: M               HR:           101 bpm. Exam Location:  Inpatient Procedure: 2D Echo, Cardiac Doppler, Color Doppler and Intracardiac            Opacification Agent (Both Spectral and Color Flow Doppler were            utilized during procedure). Indications:    Atrial Flutter  History:        Patient has prior history of Echocardiogram examinations. CHF                 and CABG x4, CAD, Signs/Symptoms:Shortness of Breath;  Risk                 Factors:Former Smoker, Hypertension and Diabetes.  Sonographer:    Willey Harrier Referring Phys: [id number] SUBRINA SUNDIL IMPRESSIONS  1. Left ventricular ejection fraction, by estimation, is 55 to 60%. The left ventricle has normal function. The left ventricle has no regional wall motion abnormalities. There is moderate left ventricular hypertrophy. Left ventricular diastolic parameters were normal.  2. Right ventricular systolic function is normal. The right ventricular size is normal.  3. The mitral valve is normal in structure. No evidence of mitral valve regurgitation. No evidence of mitral stenosis.  4. The aortic valve is tricuspid. Aortic valve regurgitation is not visualized. Aortic valve sclerosis/calcification is present, without any evidence of aortic stenosis.  5. The inferior vena cava is normal in size with greater than 50% respiratory variability, suggesting right atrial pressure of 3 mmHg. FINDINGS  Left Ventricle: Left ventricular ejection fraction, by estimation, is 55 to 60%. The left ventricle has normal function. The left ventricle has no regional wall motion abnormalities. Definity contrast agent was given IV to delineate the left ventricular  endocardial borders. The left ventricular internal cavity size was normal in size. There is moderate left ventricular hypertrophy. Left ventricular diastolic parameters were normal. Right Ventricle: The right ventricular size is normal. Right ventricular systolic function is normal. Left Atrium: Left atrial size was normal in size. Right Atrium: Right atrial size was normal in size. Pericardium: There is no evidence of pericardial effusion. Mitral Valve: The  mitral valve is normal in structure. No evidence of mitral valve regurgitation. No evidence of mitral valve stenosis. MV peak gradient, 3.4 mmHg. The mean mitral valve gradient is 2.0 mmHg. Tricuspid Valve: The tricuspid valve is normal in structure. Tricuspid valve regurgitation  is trivial. No evidence of tricuspid stenosis. Aortic Valve: The aortic valve is tricuspid. Aortic valve regurgitation is not visualized. Aortic valve sclerosis/calcification is present, without any evidence of aortic stenosis. Aortic valve peak gradient measures 10.5 mmHg. Pulmonic Valve: The pulmonic valve was normal in structure. Pulmonic valve regurgitation is trivial. No evidence of pulmonic stenosis. Aorta: The aortic root is normal in size and structure. Venous: The inferior vena cava is normal in size with greater than 50% respiratory variability, suggesting right atrial pressure of 3 mmHg. IAS/Shunts: No atrial level shunt detected by color flow Doppler.  LEFT VENTRICLE PLAX 2D LVIDd:         4.00 cm   Diastology LVIDs:         2.80 cm   LV e' medial:    8.92 cm/s LV PW:         1.40 cm   LV E/e' medial:  9.7 LV IVS:        1.50 cm   LV e' lateral:   10.80 cm/s LVOT diam:     1.90 cm   LV E/e' lateral: 8.0 LV SV:         56 LV SV Index:   27 LVOT Area:     2.84 cm  RIGHT VENTRICLE             IVC RV Basal diam:  3.70 cm     IVC diam: 1.40 cm RV S prime:     11.30 cm/s TAPSE (M-mode): 1.3 cm LEFT ATRIUM             Index        RIGHT ATRIUM           Index LA Vol (A2C):   48.1 ml 23.40 ml/m  RA Area:     12.80 cm LA Vol (A4C):   36.6 ml 17.81 ml/m  RA Volume:   26.40 ml  12.85 ml/m LA Biplane Vol: 44.0 ml 21.41 ml/m  AORTIC VALVE AV Area (Vmax): 1.93 cm AV Vmax:        162.00 cm/s AV Peak Grad:   10.5 mmHg LVOT Vmax:      110.00 cm/s LVOT Vmean:     70.700 cm/s LVOT VTI:       0.199 m  AORTA Ao Root diam: 3.10 cm Ao Asc diam:  2.90 cm MITRAL VALVE MV Area (PHT): 3.72 cm    SHUNTS MV Area VTI:   3.09 cm    Systemic VTI:  0.20 m MV Peak grad:  3.4 mmHg    Systemic Diam: 1.90 cm MV Mean grad:  2.0 mmHg MV Vmax:       0.92 m/s MV Vmean:      77.1 cm/s MV Decel Time: 204 msec MV E velocity: 86.50 cm/s MV A velocity: 96.40 cm/s MV E/A ratio:  0.90 Alexandria Angel MD Electronically signed by Alexandria Angel  MD Signature Date/Time: 09/17/2023/1:28:18 PM    Final    DG Abd 1 View Result Date: 09/17/2023 CLINICAL DATA:  Ileus.  Abdominal pain. EXAM: ABDOMEN - 1 VIEW COMPARISON:  CT 09/13/2023 FINDINGS: Gaseous small bowel distention in the central abdomen is not significantly changed compared to recent CT. No small volume of formed stool  in the right colon. The catheter in the pelvis on prior exam is not definitively seen. Vascular calcifications. IMPRESSION: Gaseous small bowel distention in the central abdomen is not significantly changed compared to recent CT. Electronically Signed   By: Chadwick Colonel M.D.   On: 09/17/2023 11:25   DG Chest Port 1 View Result Date: 09/17/2023 CLINICAL DATA:  Acute respiratory failure EXAM: PORTABLE CHEST 1 VIEW COMPARISON:  Film from the previous day. FINDINGS: Cardiac shadow is stable. Dialysis catheter is again seen. Postsurgical changes are noted. Small pleural effusions are again seen but less prominent likely related to positioning. No focal confluent infiltrate is seen. IMPRESSION: Small effusions bilaterally. Electronically Signed   By: Violeta Grey M.D.   On: 09/17/2023 02:19   DG Chest Port 1V same Day Result Date: 09/16/2023 CLINICAL DATA:  Postop respiratory distress EXAM: PORTABLE CHEST 1 VIEW COMPARISON:  09/15/2023, 09/09/2023 FINDINGS: Right-sided central venous catheter tip at the right atrium. Sternotomy changes. Cardiomegaly with bilateral pleural effusions. Progressive airspace disease at the left base. Hazy bilateral opacity could be due to mild edema or pleural fluid. No pneumothorax IMPRESSION: Cardiomegaly with bilateral pleural effusions. Progressive airspace disease at the left base which may be due to atelectasis or pneumonia. Hazy bilateral opacity could be due to mild edema or pleural fluid. Electronically Signed   By: Esmeralda Hedge M.D.   On: 09/16/2023 16:57   US  EKG SITE RITE Result Date: 09/16/2023 If [facility] image not attached, placement  could not be confirmed due to current cardiac rhythm.  HYBRID OR IMAGING ([facility] ONLY) Result Date: 09/16/2023 There is no interpretation for this exam.  This order is for images obtained during a surgical procedure.  Please See "Surgeries" Tab for more information regarding the procedure.     Patient Profile     78 y.o. male with past medical history of coronary artery disease status post coronary bypass and graft, prior CVA, hypertension, hyperlipidemia, diabetes mellitus, end-stage renal disease peripheral vascular disease being seen for postoperative atrial flutter.  Patient admitted with acute appendicitis.  Patient started on dialysis this admission.  He also has had acute blood loss anemia.  He developed atrial flutter and cardiology asked to evaluate.  Echocardiogram shows ejection fraction 55 to 60%, moderate left ventricular hypertrophy.  Assessment & Plan    1 new onset atrial flutter-patient is back in sinus rhythm.  Will continue IV amiodarone  for now.  Will add low-dose metoprolol  later once he is tolerating oral medications.  Will not anticoagulate in the setting of recent appendectomy and acute blood loss anemia.  Can plan later once it is clear his bleeding has stopped.  2 coronary artery disease status post coronary bypass and graft-continue statin.  3 end-stage renal disease-dialysis per nephrology.  4 status post appendectomy-Per primary service.  For questions or updates, please contact [facility] Please consult [email or web address] for contact info under        Signed, Alexandria Angel, MD  09/18/2023, 8:17 AM

## 2023-09-18 NOTE — Progress Notes (Signed)
 [facility] NEPHROLOGY PROGRESS NOTE  Assessment/ Plan: Pt is a 78 y.o. yo male with CAD status post CABG, underwent laparoscopic appendectomy on 5/7 developed postop ileus, AKI on CKD.  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  UA with pyuria and kidney ultrasound without hydronephrosis. No sign of renal recovery with worsening azotemia. First HD on 5/14, second HD on 5/15.  Received third HD 5/17 with 1.5 L UF, tolerated well.  Continues on TTS schedule.  Ended up getting into trouble fluid wise with delayed HD 5/22- got in middle of the night urgently-   plan for next HD today Renal navigator is aware of arranging outpatient HD as he gets closer to discharge.  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  On 5/14, the AV fistula is unable to cannulate even with small needle 17G.   TDC  with [facility], 5/22. [facility] to follow as an outpatient regarding his AV fistula   # Appendicitis with possible abscess and postop ileus: s/p  antibiotics and drain per surgical team.  TPN for the nutrition.  Antibiotics held, continued clinical observation.  Surgery following.  # Acute blood loss anemia, CT angio negative for bleeding.  Transfuse as needed.  Started ESA 5/22.  No IV iron until infectious issues clearly resolved.  # Metabolic acidosis: dc sodium bicarbonate , HD now.  # CKD-MBD, hyperphosphatemia: Phosphorus stable and with poor p.o. is off sevelamer , PTH level 337, continue to monitor.  # Hypokalemia: Managed with HD, monitor lab.  Had resp distress overnight 5/22-  responded to HD but in retrospect did not seem that overloaded-  try to be as aggressive as can be with UF   Subjective:  Seen and examined. 375 UOP Had a fairly uneventful night in ICU-  resp  status remained good-  on 3L [place] this am-  plan for HD later today - I think can come up to the HD unit  Talkative this am-  says passing gas-  feels urge to urinate but has foley cath in place   Objective Vital signs in last 24 hours: Vitals:   09/18/23 0400 09/18/23 0500 09/18/23 0600 09/18/23 0700  BP: 113/65 (!) 118/55 127/63 117/70  Pulse: (!) 103 (!) 104 (!) 107 (!) 104  Resp: 16 20 (!) 21 (!) 21  Temp:      TempSrc:      SpO2: 99% 99% 100% 100%  Weight:      Height:       Weight change:   Intake/Output Summary (Last 24 hours) at 09/18/2023 0732 Last data filed at 09/18/2023 0600 Gross per 24 hour  Intake 706.75 ml  Output 275 ml  Net 431.75 ml     Labs: RENAL PANEL Recent Labs  Lab 09/13/23 0559 09/14/23 0646 09/15/23 0920 09/16/23 0435 09/16/23 1744 09/16/23 2249 09/17/23 0141 09/17/23 0232 09/18/23 0235  NA 140   < > 140 142 144 142 143 143 136  K 3.4*   < > 3.9 3.9 4.6 4.5 4.7 4.5 3.4*  CL 99   < > 103 104 107 108  --  104 94*  CO2 28   < > 26 23 23 23   --  22 25  GLUCOSE 209*   < > 204* 167* 171* 180*  --  202* 229*  BUN 74*   < > 63* 83* 90* 93*  --  92* 51*  CREATININE 4.59*   < > 3.54* 4.04* 4.30* 4.28*  --  4.18* 3.44*  CALCIUM  8.5*   < > 8.7* 9.1 9.0 9.1  --  9.3 8.8*  MG 2.0  --  1.6* 1.7  --  2.2  --  2.3  --   PHOS 2.2*   < > 3.4 3.5 4.7*  --   --  4.3 4.9*  ALBUMIN  1.7*  --  1.8* 1.8* 2.1*  --   --  2.4* 2.4*  2.4*   < > = values in this interval not displayed.    Liver Function Tests: Recent Labs  Lab 09/16/23 0435 09/16/23 1744 09/17/23 0232 09/18/23 0235  AST 102*  --  70* 47*  ALT 105*  --  73* 39  ALKPHOS 97  --  108 99  BILITOT 0.8  --  0.6 0.7  PROT 6.3*  --  7.2 6.7  ALBUMIN  1.8* 2.1* 2.4* 2.4*  2.4*   No results for input(s): "LIPASE", "AMYLASE" in the last 168 hours.  No results for input(s): "AMMONIA" in the last 168 hours. CBC: Recent Labs    01/18/23 1118 04/06/23 0910 09/14/23 0646 09/16/23 0435  09/16/23 2249 09/17/23 0141 09/18/23 0235  HGB  --    < > 8.8* 8.5* 8.6* 7.8* 8.0*  MCV  --    < > 99.6 102.3* 103.4*  --  101.2*  VITAMINB12 565  --   --   --   --   --   --    < > = values in this interval not displayed.    Cardiac Enzymes: No results for input(s): "CKTOTAL", "CKMB", "CKMBINDEX", "TROPONINI" in the last 168 hours. CBG: Recent Labs  Lab 09/17/23 1129 09/17/23 1537 09/17/23 2022 09/17/23 2355 09/18/23 0330  GLUCAP 175* 164* 90 116* 114*    Iron Studies: No results for input(s): "IRON", "TIBC", "TRANSFERRIN", "FERRITIN" in the last 72 hours. Studies/Results: ECHOCARDIOGRAM COMPLETE Result Date: 09/17/2023    ECHOCARDIOGRAM REPORT   Patient Name:   EVERARD INTERRANTE Date of Exam: 09/17/2023 Medical Rec #:  [id number]       Height:       69.0 in Accession #:    [id number]      Weight:       197.5 lb Date of Birth:  1946/02/02      BSA:          2.055 m Patient Age:    77 years        BP:           137/66 mmHg Patient Gender: M               HR:           101 bpm. Exam Location:  Inpatient Procedure: 2D Echo, Cardiac Doppler, Color Doppler and Intracardiac            Opacification Agent (Both Spectral and Color Flow Doppler were            utilized during procedure). Indications:    Atrial Flutter  History:        Patient has prior history of Echocardiogram examinations. CHF                 and CABG x4, CAD, Signs/Symptoms:Shortness of Breath; Risk  Factors:Former Smoker, Hypertension and Diabetes.  Sonographer:    Willey Harrier Referring Phys: [id number] SUBRINA SUNDIL IMPRESSIONS  1. Left ventricular ejection fraction, by estimation, is 55 to 60%. The left ventricle has normal function. The left ventricle has no regional wall motion abnormalities. There is moderate left ventricular hypertrophy. Left ventricular diastolic parameters were normal.  2. Right ventricular systolic function is normal. The right ventricular size is normal.  3. The mitral valve is normal in  structure. No evidence of mitral valve regurgitation. No evidence of mitral stenosis.  4. The aortic valve is tricuspid. Aortic valve regurgitation is not visualized. Aortic valve sclerosis/calcification is present, without any evidence of aortic stenosis.  5. The inferior vena cava is normal in size with greater than 50% respiratory variability, suggesting right atrial pressure of 3 mmHg. FINDINGS  Left Ventricle: Left ventricular ejection fraction, by estimation, is 55 to 60%. The left ventricle has normal function. The left ventricle has no regional wall motion abnormalities. Definity contrast agent was given IV to delineate the left ventricular  endocardial borders. The left ventricular internal cavity size was normal in size. There is moderate left ventricular hypertrophy. Left ventricular diastolic parameters were normal. Right Ventricle: The right ventricular size is normal. Right ventricular systolic function is normal. Left Atrium: Left atrial size was normal in size. Right Atrium: Right atrial size was normal in size. Pericardium: There is no evidence of pericardial effusion. Mitral Valve: The mitral valve is normal in structure. No evidence of mitral valve regurgitation. No evidence of mitral valve stenosis. MV peak gradient, 3.4 mmHg. The mean mitral valve gradient is 2.0 mmHg. Tricuspid Valve: The tricuspid valve is normal in structure. Tricuspid valve regurgitation is trivial. No evidence of tricuspid stenosis. Aortic Valve: The aortic valve is tricuspid. Aortic valve regurgitation is not visualized. Aortic valve sclerosis/calcification is present, without any evidence of aortic stenosis. Aortic valve peak gradient measures 10.5 mmHg. Pulmonic Valve: The pulmonic valve was normal in structure. Pulmonic valve regurgitation is trivial. No evidence of pulmonic stenosis. Aorta: The aortic root is normal in size and structure. Venous: The inferior vena cava is normal in size with greater than 50%  respiratory variability, suggesting right atrial pressure of 3 mmHg. IAS/Shunts: No atrial level shunt detected by color flow Doppler.  LEFT VENTRICLE PLAX 2D LVIDd:         4.00 cm   Diastology LVIDs:         2.80 cm   LV e' medial:    8.92 cm/s LV PW:         1.40 cm   LV E/e' medial:  9.7 LV IVS:        1.50 cm   LV e' lateral:   10.80 cm/s LVOT diam:     1.90 cm   LV E/e' lateral: 8.0 LV SV:         56 LV SV Index:   27 LVOT Area:     2.84 cm  RIGHT VENTRICLE             IVC RV Basal diam:  3.70 cm     IVC diam: 1.40 cm RV S prime:     11.30 cm/s TAPSE (M-mode): 1.3 cm LEFT ATRIUM             Index        RIGHT ATRIUM           Index LA Vol (A2C):   48.1 ml 23.40 ml/m  RA Area:  12.80 cm LA Vol (A4C):   36.6 ml 17.81 ml/m  RA Volume:   26.40 ml  12.85 ml/m LA Biplane Vol: 44.0 ml 21.41 ml/m  AORTIC VALVE AV Area (Vmax): 1.93 cm AV Vmax:        162.00 cm/s AV Peak Grad:   10.5 mmHg LVOT Vmax:      110.00 cm/s LVOT Vmean:     70.700 cm/s LVOT VTI:       0.199 m  AORTA Ao Root diam: 3.10 cm Ao Asc diam:  2.90 cm MITRAL VALVE MV Area (PHT): 3.72 cm    SHUNTS MV Area VTI:   3.09 cm    Systemic VTI:  0.20 m MV Peak grad:  3.4 mmHg    Systemic Diam: 1.90 cm MV Mean grad:  2.0 mmHg MV Vmax:       0.92 m/s MV Vmean:      77.1 cm/s MV Decel Time: 204 msec MV E velocity: 86.50 cm/s MV A velocity: 96.40 cm/s MV E/A ratio:  0.90 Alexandria Angel MD Electronically signed by Alexandria Angel MD Signature Date/Time: 09/17/2023/1:28:18 PM    Final    DG Abd 1 View Result Date: 09/17/2023 CLINICAL DATA:  Ileus.  Abdominal pain. EXAM: ABDOMEN - 1 VIEW COMPARISON:  CT 09/13/2023 FINDINGS: Gaseous small bowel distention in the central abdomen is not significantly changed compared to recent CT. No small volume of formed stool in the right colon. The catheter in the pelvis on prior exam is not definitively seen. Vascular calcifications. IMPRESSION: Gaseous small bowel distention in the central abdomen is not significantly  changed compared to recent CT. Electronically Signed   By: Chadwick Colonel M.D.   On: 09/17/2023 11:25   DG Chest Port 1 View Result Date: 09/17/2023 CLINICAL DATA:  Acute respiratory failure EXAM: PORTABLE CHEST 1 VIEW COMPARISON:  Film from the previous day. FINDINGS: Cardiac shadow is stable. Dialysis catheter is again seen. Postsurgical changes are noted. Small pleural effusions are again seen but less prominent likely related to positioning. No focal confluent infiltrate is seen. IMPRESSION: Small effusions bilaterally. Electronically Signed   By: Violeta Grey M.D.   On: 09/17/2023 02:19   DG Chest Port 1V same Day Result Date: 09/16/2023 CLINICAL DATA:  Postop respiratory distress EXAM: PORTABLE CHEST 1 VIEW COMPARISON:  09/15/2023, 09/09/2023 FINDINGS: Right-sided central venous catheter tip at the right atrium. Sternotomy changes. Cardiomegaly with bilateral pleural effusions. Progressive airspace disease at the left base. Hazy bilateral opacity could be due to mild edema or pleural fluid. No pneumothorax IMPRESSION: Cardiomegaly with bilateral pleural effusions. Progressive airspace disease at the left base which may be due to atelectasis or pneumonia. Hazy bilateral opacity could be due to mild edema or pleural fluid. Electronically Signed   By: Esmeralda Hedge M.D.   On: 09/16/2023 16:57   US  EKG SITE RITE Result Date: 09/16/2023 If [facility] image not attached, placement could not be confirmed due to current cardiac rhythm.  HYBRID OR IMAGING ([facility] ONLY) Result Date: 09/16/2023 There is no interpretation for this exam.  This order is for images obtained during a surgical procedure.  Please See "Surgeries" Tab for more information regarding the procedure.     Medications: Infusions:  amiodarone  30 mg/hr (09/18/23 0600)    Scheduled Medications:  Chlorhexidine  Gluconate Cloth  6 each Topical Q0600   darbepoetin (ARANESP ) injection - DIALYSIS  100 mcg Subcutaneous Q Thu-1800   feeding  supplement  1 Container Oral TID BM   fluticasone   furoate-vilanterol  1 puff Inhalation Daily   heparin  injection (subcutaneous)  5,000 Units Subcutaneous Q8H   insulin  aspart  0-20 Units Subcutaneous Q4H   sodium chloride  flush  3 mL Intravenous Q12H    have reviewed scheduled and prn medications.  Physical Exam: General:NAD, he has NG tube. Heart:RRR, s1s2 nl Lungs:clear b/l, no crackle Abdomen:soft, drain present Extremities:No edema Neurology: Alert, awake and following commands Vascular Access: AV fistula is quite deep, thrill positive. Temp HD cath.  Fantasia Jinkins A Aaleigha Bozza 09/18/2023,7:32 AM  LOS: 18 days

## 2023-09-18 NOTE — Progress Notes (Signed)
 Pt goal not met d/t pt had hypotension during tx. Dilaudid  1 mg IV given after HD.  09/18/23 1900  Vitals  Temp 98 F (36.7 C)  Temp Source Oral  BP 110/78  BP Location Right Arm  BP Method Automatic  Patient Position (if appropriate) Lying  Pulse Rate (!) 118  ECG Heart Rate (!) 118  Resp 20  Oxygen Therapy  SpO2 100 %  O2 Device Nasal Cannula  O2 Flow Rate (L/min) 3 L/min  During Treatment Monitoring  Blood Flow Rate (mL/min) 0 mL/min  Arterial Pressure (mmHg) -30.1 mmHg  Venous Pressure (mmHg) -2.42 mmHg  TMP (mmHg) 11.51 mmHg  Ultrafiltration Rate (mL/min) 0 mL/min  Dialysate Flow Rate (mL/min) 299 ml/min  Dialysate Potassium Concentration 3  Dialysate Calcium  Concentration 2.5  Duration of HD Treatment -hour(s) 3 hour(s)  Cumulative Fluid Removed (mL) per Treatment  1998.29  HD Safety Checks Performed Yes  Intra-Hemodialysis Comments Tx completed  Post Treatment  Dialyzer Clearance Lightly streaked  Hemodialysis Intake (mL) 0 mL  Liters Processed 72  Fluid Removed (mL) 2000 mL  Tolerated HD Treatment Yes  Post-Hemodialysis Comments Pt goal not met d/t pt had hypotension during TX.  Hemodialysis Catheter Right Internal jugular Double lumen Permanent (Tunneled)  Placement Date/Time: 09/16/23 1356   Serial / Lot #: [id number]  Expiration Date: 09/25/27  Time Out: Correct patient;Correct site;Correct procedure  Maximum sterile barrier precautions: Hand hygiene;Large sterile sheet;Cap;Sterile probe cover;Mask;Ste...  Site Condition No complications  Blue Lumen Status Heparin  locked  Red Lumen Status Heparin  locked  Catheter fill solution Heparin  1000 units/ml  Catheter fill volume (Arterial) 1.9 cc  Catheter fill volume (Venous) 1.9  Dressing Type Transparent;Gauze/Drain sponge  Dressing Status Antimicrobial disc/dressing in place;Clean, Dry, Intact  Interventions New dressing  Drainage Description None  Dressing Change Due 09/24/23  Post treatment catheter status  Capped and Clamped

## 2023-09-18 NOTE — Progress Notes (Signed)
   09/18/23 2313  BiPAP/CPAP/SIPAP  Reason BIPAP/CPAP not in use Non-compliant (Pt refused. No distress noted)  BiPAP/CPAP /SiPAP Vitals  Temp 98.8 F (37.1 C)  Pulse Rate (!) 122  Resp 20  BP 104/70  SpO2 95 %  MEWS Score/Color  MEWS Score 2  MEWS Score Color Yellow

## 2023-09-18 NOTE — Plan of Care (Signed)
  Problem: Coping: Goal: Ability to adjust to condition or change in health will improve Outcome: Progressing   Problem: Fluid Volume: Goal: Ability to maintain a balanced intake and output will improve Outcome: Progressing   Problem: Coping: Goal: Ability to adjust to condition or change in health will improve Outcome: Progressing   Problem: Fluid Volume: Goal: Ability to maintain a balanced intake and output will improve Outcome: Progressing

## 2023-09-19 DIAGNOSIS — I4892 Unspecified atrial flutter: Secondary | ICD-10-CM

## 2023-09-19 LAB — RENAL FUNCTION PANEL
Albumin: 2.6 g/dL — ABNORMAL LOW (ref 3.5–5.0)
Anion gap: 18 — ABNORMAL HIGH (ref 5–15)
BUN: 30 mg/dL — ABNORMAL HIGH (ref 8–23)
CO2: 24 mmol/L (ref 22–32)
Calcium: 9.1 mg/dL (ref 8.9–10.3)
Chloride: 95 mmol/L — ABNORMAL LOW (ref 98–111)
Creatinine, Ser: 3.07 mg/dL — ABNORMAL HIGH (ref 0.61–1.24)
GFR, Estimated: 20 mL/min — ABNORMAL LOW (ref >60)
Glucose, Bld: 123 mg/dL — ABNORMAL HIGH (ref 70–99)
Phosphorus: 4.6 mg/dL (ref 2.5–4.6)
Potassium: 3.7 mmol/L (ref 3.5–5.1)
Sodium: 137 mmol/L (ref 135–145)

## 2023-09-19 LAB — GLUCOSE, CAPILLARY
Glucose-Capillary: 133 mg/dL — ABNORMAL HIGH (ref 70–99)
Glucose-Capillary: 139 mg/dL — ABNORMAL HIGH (ref 70–99)
Glucose-Capillary: 116 mg/dL — ABNORMAL HIGH (ref 70–99)
Glucose-Capillary: 135 mg/dL — ABNORMAL HIGH (ref 70–99)

## 2023-09-19 MED ORDER — METOPROLOL TARTRATE 25 MG PO TABS
25.0000 mg | ORAL_TABLET | Freq: Two times a day (BID) | ORAL | Status: DC
  Administered 2023-09-19 – 2023-09-20 (×4): 25 mg via ORAL
  Filled 2023-09-19 (×4): qty 1

## 2023-09-19 MED ORDER — OXYCODONE-ACETAMINOPHEN 7.5-325 MG PO TABS
2.0000 | ORAL_TABLET | ORAL | Status: DC | PRN
  Administered 2023-09-19 – 2023-09-22 (×7): 2 via ORAL
  Filled 2023-09-19 (×7): qty 2

## 2023-09-19 NOTE — Progress Notes (Addendum)
 [facility] PROGRESS NOTE    Progress Note  Christian [name]  WUJ:[id number] DOB: Aug 26, 1945 DOA: 08/30/2023 PCP: [name], MD    Brief Narrative:   Christian [name] is an 78 y.o. male past medical history of CABG postop CVA, chronic kidney disease stage IV who underwent laparoscopic appendectomy by general surgery on 08/31/2023 developed a postoperative ileus, JP drain in place, and NG tube to suction developed acute kidney injury, nephrology was consulted and is undergoing hemodialysis, we were unable to use his old AV fistula so he underwent HD catheter placement on the right by IR on 09/08/2022, CT scan of the abdomen pelvis on 09/08/2022 showed GB drainage with hematoma and persistent postop ileus.  NG tube has since been removed.  A  Assessment/Plan:   Acute perforated Appendicitis abscess, status post surgical intervention 08/31/2023 and now has developed a postop ileus: Completed 14-day course of IV Zosyn  blood cultures remain negative. JP drain with minimal serosanguineous output. Tolerating clear liquid diet having bowel movements. Out of bed to chair, working with physical therapy. Repeated CT scan on 09/08/2022 showed complex 5 x 11 x 4 complex fluid collection. ID was consulted due to persistent fever and leukocytosis on 09/13/2023 despite IV antibiotics General Surgery is more concerned about a hematoma rather than abscess. ID recommended to hold on antibiotics.   Further management per surgery. Passing gas just had a bowel movement this morning. Now on a regular diet Further management per surgery..  Acute renal failure superimposed on stage 4 chronic kidney disease (HCC) With a baseline creatinine around 3.2-3.5. Probably developed ATN in the setting of hypotension and blood loss, nephrology was consulted HD catheter was placed now on dialysis. Vascular surgery was consulted fistulogram performed 09/07/2023 with balloon venoplasty to the left fistula. Underwent  right IJ temporary catheter placement by IR on 09/08/2022.  Removed on 09/16/2023. Vascular placed tunneled dialysis catheter placement on 09/16/2023. First hemodialysis wound 09/08/2023. Continue dialysis per renal, last dialysis on 09/18/2023. Further management per nephrology.  Acute blood loss anemia: Hemoglobin around 10 there was a drop to 7.5, s/p 1 unit packed red blood cells his hemoglobin has remained relatively stable. Hemoglobin this morning is 8.0.  A-fib with RVR: Now on amiodarone  drip.  Cardiology was consulted. Chads Vascor greater than 3 Converted to sinus rhythm. Cardiology not a candidate for anticoagulation at this time.  I agree he is not a candidate for anticoagulation at this time. This is first episode of A-fib with RVR likely due to acute perforated appendicitis with abscess.  He is now back in sinus rhythm. Continue metoprolol  orally and amiodarone  for 6 weeks. Further management per cardiology.  Essential hypertension: Currently on IV metoprolol  every 6 hours, and hydralazine  10 every 6 hours as needed. On oral metoprolol .  Diabetes mellitus type 2: TPN had held as right IJ was discontinued. Now on sliding scale in the blood glucose fairly controlled. Tolerating regular diet.  CAD: Evaluated by cardiology no further ischemic evaluation.  Thrombocytosis: Likely reactive.  Electrolyte imbalance hypokalemia/hypomagnesemia: Potassium is 3.4 phosphorus 4.9. Continue correction per renal.  Panic attack: He required 2 to 3 L of oxygen. VQ scan showed very low probability.  Severe protein caloric malnutrition: Tolerating a diet continue regular diet.  Back pain: Increase Dilaudid .   DVT prophylaxis: heparin  Family Communication:none Status is: Inpatient Remains inpatient appropriate because: Acute appendicitis now with acute kidney injury    Code Status:     Code Status Orders  (From admission, onward)  Start     Ordered    08/31/23 0416  Full code  Continuous       Question:  By:  Answer:  Consent: discussion documented in EHR   08/31/23 0416           Code Status History     Date Active Date Inactive Code Status Order ID Comments User Context   12/12/2020 0212 12/16/2020 1900 Full Code [id number]  Dea Evert, DO ED   10/03/2020 1828 10/07/2020 1752 Full Code [id number]  Ozell Blunt, MD Inpatient   08/09/2019 1913 10/18/2019 1749 Full Code [id number]  Knox Perl, MD ED   09/27/2014 1331 10/01/2014 2151 Full Code [id number]  Annamarie Kid, MD Inpatient   02/27/2014 1233 02/28/2014 1432 Full Code [id number]  Knox Perl, MD Inpatient         IV Access:   Peripheral IV   Procedures and diagnostic studies:   ECHOCARDIOGRAM COMPLETE Result Date: 09/17/2023    ECHOCARDIOGRAM REPORT   Patient Name:   Christian [name] Date of Exam: 09/17/2023 Medical Rec #:  [id number]       Height:       69.0 in Accession #:    [id number]      Weight:       197.5 lb Date of Birth:  Mar 06, 1946      BSA:          2.055 m Patient Age:    77 years        BP:           137/66 mmHg Patient Gender: M               HR:           101 bpm. Exam Location:  Inpatient Procedure: 2D Echo, Cardiac Doppler, Color Doppler and Intracardiac            Opacification Agent (Both Spectral and Color Flow Doppler were            utilized during procedure). Indications:    Atrial Flutter  History:        Patient has prior history of Echocardiogram examinations. CHF                 and CABG x4, CAD, Signs/Symptoms:Shortness of Breath; Risk                 Factors:Former Smoker, Hypertension and Diabetes.  Sonographer:    Willey Harrier Referring Phys: [id number] SUBRINA SUNDIL IMPRESSIONS  1. Left ventricular ejection fraction, by estimation, is 55 to 60%. The left ventricle has normal function. The left ventricle has no regional wall motion abnormalities. There is moderate left ventricular hypertrophy. Left ventricular diastolic parameters were normal.  2. Right  ventricular systolic function is normal. The right ventricular size is normal.  3. The mitral valve is normal in structure. No evidence of mitral valve regurgitation. No evidence of mitral stenosis.  4. The aortic valve is tricuspid. Aortic valve regurgitation is not visualized. Aortic valve sclerosis/calcification is present, without any evidence of aortic stenosis.  5. The inferior vena cava is normal in size with greater than 50% respiratory variability, suggesting right atrial pressure of 3 mmHg. FINDINGS  Left Ventricle: Left ventricular ejection fraction, by estimation, is 55 to 60%. The left ventricle has normal function. The left ventricle has no regional wall motion abnormalities. Definity contrast agent was given IV to delineate the left ventricular  endocardial borders. The left ventricular internal cavity size was  normal in size. There is moderate left ventricular hypertrophy. Left ventricular diastolic parameters were normal. Right Ventricle: The right ventricular size is normal. Right ventricular systolic function is normal. Left Atrium: Left atrial size was normal in size. Right Atrium: Right atrial size was normal in size. Pericardium: There is no evidence of pericardial effusion. Mitral Valve: The mitral valve is normal in structure. No evidence of mitral valve regurgitation. No evidence of mitral valve stenosis. MV peak gradient, 3.4 mmHg. The mean mitral valve gradient is 2.0 mmHg. Tricuspid Valve: The tricuspid valve is normal in structure. Tricuspid valve regurgitation is trivial. No evidence of tricuspid stenosis. Aortic Valve: The aortic valve is tricuspid. Aortic valve regurgitation is not visualized. Aortic valve sclerosis/calcification is present, without any evidence of aortic stenosis. Aortic valve peak gradient measures 10.5 mmHg. Pulmonic Valve: The pulmonic valve was normal in structure. Pulmonic valve regurgitation is trivial. No evidence of pulmonic stenosis. Aorta: The aortic root  is normal in size and structure. Venous: The inferior vena cava is normal in size with greater than 50% respiratory variability, suggesting right atrial pressure of 3 mmHg. IAS/Shunts: No atrial level shunt detected by color flow Doppler.  LEFT VENTRICLE PLAX 2D LVIDd:         4.00 cm   Diastology LVIDs:         2.80 cm   LV e' medial:    8.92 cm/s LV PW:         1.40 cm   LV E/e' medial:  9.7 LV IVS:        1.50 cm   LV e' lateral:   10.80 cm/s LVOT diam:     1.90 cm   LV E/e' lateral: 8.0 LV SV:         56 LV SV Index:   27 LVOT Area:     2.84 cm  RIGHT VENTRICLE             IVC RV Basal diam:  3.70 cm     IVC diam: 1.40 cm RV S prime:     11.30 cm/s TAPSE (M-mode): 1.3 cm LEFT ATRIUM             Index        RIGHT ATRIUM           Index LA Vol (A2C):   48.1 ml 23.40 ml/m  RA Area:     12.80 cm LA Vol (A4C):   36.6 ml 17.81 ml/m  RA Volume:   26.40 ml  12.85 ml/m LA Biplane Vol: 44.0 ml 21.41 ml/m  AORTIC VALVE AV Area (Vmax): 1.93 cm AV Vmax:        162.00 cm/s AV Peak Grad:   10.5 mmHg LVOT Vmax:      110.00 cm/s LVOT Vmean:     70.700 cm/s LVOT VTI:       0.199 m  AORTA Ao Root diam: 3.10 cm Ao Asc diam:  2.90 cm MITRAL VALVE MV Area (PHT): 3.72 cm    SHUNTS MV Area VTI:   3.09 cm    Systemic VTI:  0.20 m MV Peak grad:  3.4 mmHg    Systemic Diam: 1.90 cm MV Mean grad:  2.0 mmHg MV Vmax:       0.92 m/s MV Vmean:      77.1 cm/s MV Decel Time: 204 msec MV E velocity: 86.50 cm/s MV A velocity: 96.40 cm/s MV E/A ratio:  0.90 Alexandria Angel MD Electronically signed by Alexandria Angel MD  Signature Date/Time: 09/17/2023/1:28:18 PM    Final    DG Abd 1 View Result Date: 09/17/2023 CLINICAL DATA:  Ileus.  Abdominal pain. EXAM: ABDOMEN - 1 VIEW COMPARISON:  CT 09/13/2023 FINDINGS: Gaseous small bowel distention in the central abdomen is not significantly changed compared to recent CT. No small volume of formed stool in the right colon. The catheter in the pelvis on prior exam is not definitively seen. Vascular  calcifications. IMPRESSION: Gaseous small bowel distention in the central abdomen is not significantly changed compared to recent CT. Electronically Signed   By: Chadwick Colonel M.D.   On: 09/17/2023 11:25     Medical Consultants:   None.   Subjective:    KRAIG GENIS moderating his diet this morning having bowel movements.  Objective:    Vitals:   09/18/23 2313 09/19/23 0210 09/19/23 0243 09/19/23 0730  BP: 104/70 113/61  134/70  Pulse: (!) 122 (!) 119  (!) 121  Resp: 20 16  20   Temp: 98.8 F (37.1 C) 99.3 F (37.4 C)  98.4 F (36.9 C)  TempSrc: Oral Oral  Oral  SpO2: 95% 99%  96%  Weight:   81.4 kg   Height:       SpO2: 96 % O2 Flow Rate (L/min): 3 L/min FiO2 (%): 40 %   Intake/Output Summary (Last 24 hours) at 09/19/2023 0814 Last data filed at 09/18/2023 2300 Gross per 24 hour  Intake 286 ml  Output 2100 ml  Net -1814 ml   Filed Weights   09/18/23 1530 09/18/23 1923 09/19/23 0243  Weight: 87.1 kg 85 kg 81.4 kg    Exam: General exam: In no acute distress. Respiratory system: Good air movement and clear to auscultation. Cardiovascular system: S1 & S2 heard, RRR. No JVD. Gastrointestinal system: Abdomen is nondistended, soft and nontender.  Extremities: No pedal edema. Skin: No rashes, lesions or ulcers Psychiatry: Judgement and insight appear normal. Mood & affect appropriate.  Data Reviewed:    Labs: Basic Metabolic Panel: Recent Labs  Lab 09/13/23 0559 09/14/23 1610 09/15/23 0920 09/16/23 9604 09/16/23 1744 09/16/23 2249 09/17/23 0141 09/17/23 0232 09/18/23 0235 09/19/23 0225  NA 140   < > 140 142 144 142 143 143 136 137  K 3.4*   < > 3.9 3.9 4.6 4.5 4.7 4.5 3.4* 3.7  CL 99   < > 103 104 107 108  --  104 94* 95*  CO2 28   < > 26 23 23 23   --  22 25 24   GLUCOSE 209*   < > 204* 167* 171* 180*  --  202* 229* 123*  BUN 74*   < > 63* 83* 90* 93*  --  92* 51* 30*  CREATININE 4.59*   < > 3.54* 4.04* 4.30* 4.28*  --  4.18* 3.44* 3.07*   CALCIUM  8.5*   < > 8.7* 9.1 9.0 9.1  --  9.3 8.8* 9.1  MG 2.0  --  1.6* 1.7  --  2.2  --  2.3  --   --   PHOS 2.2*   < > 3.4 3.5 4.7*  --   --  4.3 4.9* 4.6   < > = values in this interval not displayed.   GFR Estimated Creatinine Clearance: 20.5 mL/min (A) (by C-G formula based on SCr of 3.07 mg/dL (H)). Liver Function Tests: Recent Labs  Lab 09/13/23 0559 09/15/23 0920 09/16/23 0435 09/16/23 1744 09/17/23 0232 09/18/23 0235 09/19/23 0225  AST 70*  --  102*  --  70* 47*  --   ALT 45*  --  105*  --  73* 39  --   ALKPHOS 83  --  97  --  108 99  --   BILITOT 1.7*  --  0.8  --  0.6 0.7  --   PROT 5.9*  --  6.3*  --  7.2 6.7  --   ALBUMIN  1.7*   < > 1.8* 2.1* 2.4* 2.4*  2.4* 2.6*   < > = values in this interval not displayed.   No results for input(s): "LIPASE", "AMYLASE" in the last 168 hours. No results for input(s): "AMMONIA" in the last 168 hours. Coagulation profile No results for input(s): "INR", "PROTIME" in the last 168 hours. COVID-19 Labs  No results for input(s): "DDIMER", "FERRITIN", "LDH", "CRP" in the last 72 hours.  Lab Results  Component Value Date   SARSCOV2NAA NEGATIVE 12/11/2020   SARSCOV2NAA NEGATIVE 10/03/2020   SARSCOV2NAA NEGATIVE 10/17/2019   SARSCOV2NAA NEGATIVE 10/13/2019    CBC: Recent Labs  Lab 09/13/23 0559 09/14/23 0646 09/16/23 0435 09/16/23 2249 09/17/23 0141 09/18/23 0235  WBC 12.8* 13.2* 11.8* 13.4*  --  13.2*  NEUTROABS 9.5*  --   --   --   --   --   HGB 8.7* 8.8* 8.5* 8.6* 7.8* 8.0*  HCT 26.9* 27.3* 26.9* 27.2* 23.0* 25.2*  MCV 100.0 99.6 102.3* 103.4*  --  101.2*  PLT 375 371 322 317  --  277   Cardiac Enzymes: No results for input(s): "CKTOTAL", "CKMB", "CKMBINDEX", "TROPONINI" in the last 168 hours. BNP (last 3 results) No results for input(s): "PROBNP" in the last 8760 hours. CBG: Recent Labs  Lab 09/18/23 0330 09/18/23 0809 09/18/23 1220 09/18/23 2047 09/19/23 0612  GLUCAP 114* 112* 114* 139* 133*    D-Dimer: No results for input(s): "DDIMER" in the last 72 hours. Hgb A1c: No results for input(s): "HGBA1C" in the last 72 hours. Lipid Profile: No results for input(s): "CHOL", "HDL", "LDLCALC", "TRIG", "CHOLHDL", "LDLDIRECT" in the last 72 hours. Thyroid  function studies: Recent Labs    09/17/23 0232  TSH 1.378   Anemia work up: No results for input(s): "VITAMINB12", "FOLATE", "FERRITIN", "TIBC", "IRON", "RETICCTPCT" in the last 72 hours. Sepsis Labs: Recent Labs  Lab 09/14/23 0646 09/16/23 0435 09/16/23 2249 09/18/23 0235  WBC 13.2* 11.8* 13.4* 13.2*   Microbiology Recent Results (from the past 240 hours)  Culture, blood (Routine X 2) w Reflex to ID Panel     Status: None   Collection Time: 09/13/23 10:11 AM   Specimen: BLOOD RIGHT HAND  Result Value Ref Range Status   Specimen Description BLOOD RIGHT HAND  Final   Special Requests   Final    BOTTLES DRAWN AEROBIC AND ANAEROBIC Blood Culture adequate volume   Culture   Final    NO GROWTH 5 DAYS Performed at [facility], [address], [place], [place] [address]    Report Status 09/18/2023 FINAL  Final  Culture, blood (Routine X 2) w Reflex to ID Panel     Status: None   Collection Time: 09/13/23 10:11 AM   Specimen: BLOOD RIGHT HAND  Result Value Ref Range Status   Specimen Description BLOOD RIGHT HAND  Final   Special Requests   Final    BOTTLES DRAWN AEROBIC AND ANAEROBIC Blood Culture adequate volume   Culture   Final    NO GROWTH 5 DAYS Performed at [facility], [address] [place], [place]  [address]    Report Status 09/18/2023 FINAL  Final     Medications:    Chlorhexidine  Gluconate Cloth  6 each Topical Q0600   darbepoetin (ARANESP ) injection - DIALYSIS  100 mcg Subcutaneous Q Thu-1800   feeding supplement  1 Container Oral TID BM   fluticasone  furoate-vilanterol  1 puff Inhalation Daily   heparin  injection (subcutaneous)  5,000 Units Subcutaneous Q8H   insulin  aspart  0-20  Units Subcutaneous TID WC   sodium chloride  flush  3 mL Intravenous Q12H   Continuous Infusions:      LOS: 19 days   Macdonald Savoy  [facility]  09/19/2023, 8:14 AM

## 2023-09-19 NOTE — Plan of Care (Signed)
   Problem: Education: Goal: Ability to describe self-care measures that may prevent or decrease complications (Diabetes Survival Skills Education) will improve Outcome: Progressing   Problem: Coping: Goal: Ability to adjust to condition or change in health will improve Outcome: Progressing   Problem: Fluid Volume: Goal: Ability to maintain a balanced intake and output will improve Outcome: Progressing

## 2023-09-19 NOTE — Progress Notes (Signed)
 Patient refused CPAP for the night

## 2023-09-19 NOTE — Progress Notes (Signed)
 Christian [name]  Assessment/ Plan: Pt is a 78 y.o. yo male with CAD status post CABG, underwent laparoscopic appendectomy on 5/7 developed postop ileus, AKI on CKD.  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  UA with pyuria and kidney ultrasound without hydronephrosis. No sign of renal recovery with worsening azotemia. First HD on 5/14, second HD on 5/15.  Received third HD 5/17 with 1.5 L UF, tolerated well.  Continues on TTS schedule.  Ended up getting into trouble fluid wise with delayed HD 5/22- got in middle of the night urgently-  then routine HD on 5/24-  plan for next HD on 5/27 Renal navigator is aware of arranging outpatient HD as he gets closer to discharge.  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  On 5/14, the AV fistula is unable to cannulate even with small needle 17G.   TDC  with [facility], 5/22. [facility] to follow as an outpatient regarding his AV fistula   # Appendicitis with possible abscess and postop ileus: s/p  antibiotics and drain per surgical team.  TPN for the nutrition.  Antibiotics held, continued clinical observation.  Surgery following.  # Acute blood loss anemia, CT angio negative for bleeding.  Transfuse as needed.  Started ESA 5/22.  No IV iron until infectious issues clearly resolved.   # CKD-MBD, hyperphosphatemia: Phosphorus stable and with poor p.o. is off sevelamer , PTH level 337, continue to monitor.  # Hypokalemia: Managed with HD, monitor lab.  Had resp distress overnight 5/22-  responded to HD but in retrospect did not seem that overloaded-  try to be as aggressive as can be with UF   Subjective:  Seen and examined. Looks brighter today -  has huge breakfast sitting in front of him-  not eating  - Minimal UOP  Completed HD  yesterday afternoon - removed 2 liters, UF limited by hypotension  Objective Vital signs in last 24 hours: Vitals:   09/18/23 2313 09/19/23 0210 09/19/23 0243 09/19/23 0730  BP: 104/70 113/61  134/70  Pulse: (!) 122 (!) 119  (!) 121  Resp: 20 16  20   Temp: 98.8 F (37.1 C) 99.3 F (37.4 C)  98.4 F (36.9 C)  TempSrc: Oral Oral  Oral  SpO2: 95% 99%  96%  Weight:   81.4 kg   Height:       Weight change:   Intake/Output Summary (Last 24 hours) at 09/19/2023 0830 Last data filed at 09/18/2023 2300 Gross per 24 hour  Intake 286 ml  Output 2100 ml  Net -1814 ml     Labs: RENAL PANEL Recent Labs  Lab 09/13/23 0559 09/14/23 0646 09/15/23 0920 09/16/23 0435 09/16/23 1744 09/16/23 2249 09/17/23 0141 09/17/23 0232 09/18/23 0235 09/19/23 0225  NA 140   < > 140 142 144 142 143 143 136 137  K 3.4*   < > 3.9 3.9 4.6 4.5 4.7 4.5 3.4* 3.7  CL 99   < > 103 104 107 108  --  104 94* 95*  CO2 28   < > 26 23 23 23   --  22 25 24   GLUCOSE 209*   < > 204* 167* 171* 180*  --  202* 229* 123*  BUN 74*   < > 63* 83*  90* 93*  --  92* 51* 30*  CREATININE 4.59*   < > 3.54* 4.04* 4.30* 4.28*  --  4.18* 3.44* 3.07*  CALCIUM  8.5*   < > 8.7* 9.1 9.0 9.1  --  9.3 8.8* 9.1  MG 2.0  --  1.6* 1.7  --  2.2  --  2.3  --   --   PHOS 2.2*   < > 3.4 3.5 4.7*  --   --  4.3 4.9* 4.6  ALBUMIN  1.7*  --  1.8* 1.8* 2.1*  --   --  2.4* 2.4*  2.4* 2.6*   < > = values in this interval not displayed.    Liver Function Tests: Recent Labs  Lab 09/16/23 0435 09/16/23 1744 09/17/23 0232 09/18/23 0235 09/19/23 0225  AST 102*  --  70* 47*  --   ALT 105*  --  73* 39  --   ALKPHOS 97  --  108 99  --   BILITOT 0.8  --  0.6 0.7  --   PROT 6.3*  --  7.2 6.7  --   ALBUMIN  1.8*   < > 2.4* 2.4*  2.4* 2.6*   < > = values in this interval not displayed.   No results for input(s): "LIPASE", "AMYLASE" in the last 168 hours.  No results for input(s): "AMMONIA" in the last 168  hours. CBC: Recent Labs    01/18/23 1118 04/06/23 0910 09/14/23 0646 09/16/23 0435 09/16/23 2249 09/17/23 0141 09/18/23 0235  HGB  --    < > 8.8* 8.5* 8.6* 7.8* 8.0*  MCV  --    < > 99.6 102.3* 103.4*  --  101.2*  VITAMINB12 565  --   --   --   --   --   --    < > = values in this interval not displayed.    Cardiac Enzymes: No results for input(s): "CKTOTAL", "CKMB", "CKMBINDEX", "TROPONINI" in the last 168 hours. CBG: Recent Labs  Lab 09/18/23 0330 09/18/23 0809 09/18/23 1220 09/18/23 2047 09/19/23 0612  GLUCAP 114* 112* 114* 139* 133*    Iron Studies: No results for input(s): "IRON", "TIBC", "TRANSFERRIN", "FERRITIN" in the last 72 hours. Studies/Results: ECHOCARDIOGRAM COMPLETE Result Date: 09/17/2023    ECHOCARDIOGRAM REPORT   Patient Name:   Christian [name] Date of Exam: 09/17/2023 Medical Rec #:  [id number]       Height:       69.0 in Accession #:    [id number]      Weight:       197.5 lb Date of Birth:  30-Dec-1945      BSA:          2.055 m Patient Age:    77 years        BP:           137/66 mmHg Patient Gender: M               HR:           101 bpm. Exam Location:  Inpatient Procedure: 2D Echo, Cardiac Doppler, Color Doppler and Intracardiac            Opacification Agent (Both Spectral and Color Flow Doppler were            utilized during procedure). Indications:    Atrial Flutter  History:        Patient has prior history of Echocardiogram examinations. CHF  and CABG x4, CAD, Signs/Symptoms:Shortness of Breath; Risk                 Factors:Former Smoker, Hypertension and Diabetes.  Sonographer:    Willey Harrier Referring Phys: [id number] SUBRINA SUNDIL IMPRESSIONS  1. Left ventricular ejection fraction, by estimation, is 55 to 60%. The left ventricle has normal function. The left ventricle has no regional wall motion abnormalities. There is moderate left ventricular hypertrophy. Left ventricular diastolic parameters were normal.  2. Right ventricular systolic  function is normal. The right ventricular size is normal.  3. The mitral valve is normal in structure. No evidence of mitral valve regurgitation. No evidence of mitral stenosis.  4. The aortic valve is tricuspid. Aortic valve regurgitation is not visualized. Aortic valve sclerosis/calcification is present, without any evidence of aortic stenosis.  5. The inferior vena cava is normal in size with greater than 50% respiratory variability, suggesting right atrial pressure of 3 mmHg. FINDINGS  Left Ventricle: Left ventricular ejection fraction, by estimation, is 55 to 60%. The left ventricle has normal function. The left ventricle has no regional wall motion abnormalities. Definity contrast agent was given IV to delineate the left ventricular  endocardial borders. The left ventricular internal cavity size was normal in size. There is moderate left ventricular hypertrophy. Left ventricular diastolic parameters were normal. Right Ventricle: The right ventricular size is normal. Right ventricular systolic function is normal. Left Atrium: Left atrial size was normal in size. Right Atrium: Right atrial size was normal in size. Pericardium: There is no evidence of pericardial effusion. Mitral Valve: The mitral valve is normal in structure. No evidence of mitral valve regurgitation. No evidence of mitral valve stenosis. MV peak gradient, 3.4 mmHg. The mean mitral valve gradient is 2.0 mmHg. Tricuspid Valve: The tricuspid valve is normal in structure. Tricuspid valve regurgitation is trivial. No evidence of tricuspid stenosis. Aortic Valve: The aortic valve is tricuspid. Aortic valve regurgitation is not visualized. Aortic valve sclerosis/calcification is present, without any evidence of aortic stenosis. Aortic valve peak gradient measures 10.5 mmHg. Pulmonic Valve: The pulmonic valve was normal in structure. Pulmonic valve regurgitation is trivial. No evidence of pulmonic stenosis. Aorta: The aortic root is normal in size and  structure. Venous: The inferior vena cava is normal in size with greater than 50% respiratory variability, suggesting right atrial pressure of 3 mmHg. IAS/Shunts: No atrial level shunt detected by color flow Doppler.  LEFT VENTRICLE PLAX 2D LVIDd:         4.00 cm   Diastology LVIDs:         2.80 cm   LV e' medial:    8.92 cm/s LV PW:         1.40 cm   LV E/e' medial:  9.7 LV IVS:        1.50 cm   LV e' lateral:   10.80 cm/s LVOT diam:     1.90 cm   LV E/e' lateral: 8.0 LV SV:         56 LV SV Index:   27 LVOT Area:     2.84 cm  RIGHT VENTRICLE             IVC RV Basal diam:  3.70 cm     IVC diam: 1.40 cm RV S prime:     11.30 cm/s TAPSE (M-mode): 1.3 cm LEFT ATRIUM             Index        RIGHT ATRIUM  Index LA Vol (A2C):   48.1 ml 23.40 ml/m  RA Area:     12.80 cm LA Vol (A4C):   36.6 ml 17.81 ml/m  RA Volume:   26.40 ml  12.85 ml/m LA Biplane Vol: 44.0 ml 21.41 ml/m  AORTIC VALVE AV Area (Vmax): 1.93 cm AV Vmax:        162.00 cm/s AV Peak Grad:   10.5 mmHg LVOT Vmax:      110.00 cm/s LVOT Vmean:     70.700 cm/s LVOT VTI:       0.199 m  AORTA Ao Root diam: 3.10 cm Ao Asc diam:  2.90 cm MITRAL VALVE MV Area (PHT): 3.72 cm    SHUNTS MV Area VTI:   3.09 cm    Systemic VTI:  0.20 m MV Peak grad:  3.4 mmHg    Systemic Diam: 1.90 cm MV Mean grad:  2.0 mmHg MV Vmax:       0.92 m/s MV Vmean:      77.1 cm/s MV Decel Time: 204 msec MV E velocity: 86.50 cm/s MV A velocity: 96.40 cm/s MV E/A ratio:  0.90 Alexandria Angel MD Electronically signed by Alexandria Angel MD Signature Date/Time: 09/17/2023/1:28:18 PM    Final    DG Abd 1 View Result Date: 09/17/2023 CLINICAL DATA:  Ileus.  Abdominal pain. EXAM: ABDOMEN - 1 VIEW COMPARISON:  CT 09/13/2023 FINDINGS: Gaseous small bowel distention in the central abdomen is not significantly changed compared to recent CT. No small volume of formed stool in the right colon. The catheter in the pelvis on prior exam is not definitively seen. Vascular calcifications.  IMPRESSION: Gaseous small bowel distention in the central abdomen is not significantly changed compared to recent CT. Electronically Signed   By: Chadwick Colonel M.D.   On: 09/17/2023 11:25     Medications: Infusions:    Scheduled Medications:  Chlorhexidine  Gluconate Cloth  6 each Topical Q0600   darbepoetin (ARANESP ) injection - DIALYSIS  100 mcg Subcutaneous Q Thu-1800   feeding supplement  1 Container Oral TID BM   fluticasone  furoate-vilanterol  1 puff Inhalation Daily   heparin  injection (subcutaneous)  5,000 Units Subcutaneous Q8H   insulin  aspart  0-20 Units Subcutaneous TID WC   metoprolol  tartrate  25 mg Oral BID   sodium chloride  flush  3 mL Intravenous Q12H    have reviewed scheduled and prn medications.  Physical Exam: General:NAD, he has NG tube. Heart:RRR, s1s2 nl Lungs:clear b/l, no crackle Abdomen:soft, drain present Extremities:No edema Neurology: Alert, awake and following commands Vascular Access: AV fistula is quite deep, thrill positive. TDC  [place] Lasseter A Hoda Hon 09/19/2023,8:30 AM  LOS: 19 days

## 2023-09-19 NOTE — Progress Notes (Signed)
   General Surgery Follow Up Note  Subjective:    Overnight Issues:   Reporting crampy lower abdominal pain but tolerating a regular diet and having bowel function.  Objective:  Vital signs for last 24 hours: Temp:  [98 F (36.7 C)-99.3 F (37.4 C)] 98.4 F (36.9 C) (05/25 0730) Pulse Rate:  [87-132] 122 (05/25 0840) Resp:  [14-32] 20 (05/25 0730) BP: (94-136)/(55-86) 134/70 (05/25 0730) SpO2:  [92 %-100 %] 96 % (05/25 0730) Weight:  [81.4 kg-87.1 kg] 81.4 kg (05/25 0243)  Hemodynamic parameters for last 24 hours:    Intake/Output from previous day: 05/24 0701 - 05/25 0700 In: 302.7 [P.O.:240; I.V.:62.7] Out: 2100 [Urine:100]  Intake/Output this shift: No intake/output data recorded.  Vent settings for last 24 hours:    Physical Exam:  Gen: alert, NAD CV: HR 120s Pulm: on nasal cannula Abd: soft, mild distention, incisions C/D/I, mild ttp over umbilical incision  Extr: wwp, no edema  Results for orders placed or performed during the hospital encounter of 08/30/23 (from the past 24 hours)  Glucose, capillary     Status: Abnormal   Collection Time: 09/18/23 12:20 PM  Result Value Ref Range   Glucose-Capillary 114 (H) 70 - 99 mg/dL  Glucose, capillary     Status: Abnormal   Collection Time: 09/18/23  8:47 PM  Result Value Ref Range   Glucose-Capillary 139 (H) 70 - 99 mg/dL  Renal function panel     Status: Abnormal   Collection Time: 09/19/23  2:25 AM  Result Value Ref Range   Sodium 137 135 - 145 mmol/L   Potassium 3.7 3.5 - 5.1 mmol/L   Chloride 95 (L) 98 - 111 mmol/L   CO2 24 22 - 32 mmol/L   Glucose, Bld 123 (H) 70 - 99 mg/dL   BUN 30 (H) 8 - 23 mg/dL   Creatinine, Ser 0.86 (H) 0.61 - 1.24 mg/dL   Calcium  9.1 8.9 - 10.3 mg/dL   Phosphorus 4.6 2.5 - 4.6 mg/dL   Albumin  2.6 (L) 3.5 - 5.0 g/dL   GFR, Estimated 20 (L) >60 mL/min   Anion gap 18 (H) 5 - 15  Glucose, capillary     Status: Abnormal   Collection Time: 09/19/23  6:12 AM  Result Value Ref  Range   Glucose-Capillary 133 (H) 70 - 99 mg/dL    Assessment & Plan: The plan of care was discussed with the bedside dialysis nurse for the day, who is in agreement with this plan and no additional concerns were raised.   Present on Admission:  Appendicitis  HTN (hypertension)  Ischemic cardiomyopathy  Acute renal failure superimposed on stage 4 chronic kidney disease (HCC)  CAD (coronary artery disease)  (Resolved) Acute appendicitis  ABLA (acute blood loss anemia)    LOS: 19 days    POD 17 - s/p lap appy with JP drain placement for perforated appendicitis [name] 5/6  - Ileus appears to have resolved. Tolerating PO with bowel function - Continue regular diet - WBC stable at 13 5/24. AF and HDS. No indication for abx - encourage OOB, ambulation      FEN - Regular diet  Lines/tubes - tunneled HD cath 5/22 VVS, PIV x2 RUE, LUE w/ AV Fistula (non-functional) VTE - heparin /plavix  on hold ID - Zosyn  d/c 5/21   Annetta Killian, [facility] 09/19/2023, 8:47 AM Please see Amion for pager number during day hours 7:00am-4:30pm

## 2023-09-19 NOTE — Progress Notes (Addendum)
 Rounding Note    Patient Name: Christian [name] Date of Encounter: 09/19/2023  [facility] Cardiologist: Knox Perl, MD   Subjective   Pt denies CP; mild dyspnea  Inpatient Medications    Scheduled Meds:  Chlorhexidine  Gluconate Cloth  6 each Topical Q0600   darbepoetin (ARANESP ) injection - DIALYSIS  100 mcg Subcutaneous Q Thu-1800   feeding supplement  1 Container Oral TID BM   fluticasone  furoate-vilanterol  1 puff Inhalation Daily   heparin  injection (subcutaneous)  5,000 Units Subcutaneous Q8H   insulin  aspart  0-20 Units Subcutaneous TID WC   sodium chloride  flush  3 mL Intravenous Q12H   Continuous Infusions:   PRN Meds: acetaminophen , albuterol , dextrose , HYDROmorphone  (DILAUDID ) injection, LORazepam , methocarbamol  (ROBAXIN ) injection, metoprolol  tartrate, naLOXone  (NARCAN )  injection, ondansetron  (ZOFRAN ) IV, simethicone , sodium chloride  flush   Vital Signs    Vitals:   09/18/23 2313 09/19/23 0210 09/19/23 0243 09/19/23 0730  BP: 104/70 113/61  134/70  Pulse: (!) 122 (!) 119  (!) 121  Resp: 20 16  20   Temp: 98.8 F (37.1 C) 99.3 F (37.4 C)  98.4 F (36.9 C)  TempSrc: Oral Oral  Oral  SpO2: 95% 99%  96%  Weight:   81.4 kg   Height:        Intake/Output Summary (Last 24 hours) at 09/19/2023 0739 Last data filed at 09/18/2023 2300 Gross per 24 hour  Intake 302.69 ml  Output 2100 ml  Net -1797.31 ml      09/19/2023    2:43 AM 09/18/2023    7:23 PM 09/18/2023    3:30 PM  Last 3 Weights  Weight (lbs) 179 lb 7.3 oz 187 lb 6.3 oz 192 lb 0.3 oz  Weight (kg) 81.4 kg 85 kg 87.1 kg      Telemetry    Sinus - Personally Reviewed   Physical Exam   GEN: NAD Neck: supple Cardiac: RRR Respiratory: CTA GI: Soft, tender MS: No edema Neuro:  No focal findings Psych: Normal affect   Labs     Chemistry Recent Labs  Lab 09/16/23 0435 09/16/23 1744 09/16/23 2249 09/17/23 0141 09/17/23 0232 09/18/23 0235 09/19/23 0225  NA 142   < >  142   < > 143 136 137  K 3.9   < > 4.5   < > 4.5 3.4* 3.7  CL 104   < > 108  --  104 94* 95*  CO2 23   < > 23  --  22 25 24   GLUCOSE 167*   < > 180*  --  202* 229* 123*  BUN 83*   < > 93*  --  92* 51* 30*  CREATININE 4.04*   < > 4.28*  --  4.18* 3.44* 3.07*  CALCIUM  9.1   < > 9.1  --  9.3 8.8* 9.1  MG 1.7  --  2.2  --  2.3  --   --   PROT 6.3*  --   --   --  7.2 6.7  --   ALBUMIN  1.8*   < >  --   --  2.4* 2.4*  2.4* 2.6*  AST 102*  --   --   --  70* 47*  --   ALT 105*  --   --   --  73* 39  --   ALKPHOS 97  --   --   --  108 99  --   BILITOT 0.8  --   --   --  0.6 0.7  --   GFRNONAA 15*   < > 14*  --  14* 18* 20*  ANIONGAP 15   < > 11  --  17* 17* 18*   < > = values in this interval not displayed.    Lipids  Recent Labs  Lab 09/13/23 0559  TRIG 228*    Hematology Recent Labs  Lab 09/16/23 0435 09/16/23 2249 09/17/23 0141 09/18/23 0235  WBC 11.8* 13.4*  --  13.2*  RBC 2.63* 2.63*  --  2.49*  HGB 8.5* 8.6* 7.8* 8.0*  HCT 26.9* 27.2* 23.0* 25.2*  MCV 102.3* 103.4*  --  101.2*  MCH 32.3 32.7  --  32.1  MCHC 31.6 31.6  --  31.7  RDW 16.2* 16.2*  --  16.2*  PLT 322 317  --  277   Thyroid   Recent Labs  Lab 09/17/23 0232  TSH 1.378     Radiology    ECHOCARDIOGRAM COMPLETE Result Date: 09/17/2023    ECHOCARDIOGRAM REPORT   Patient Name:   Christian [name] Date of Exam: 09/17/2023 Medical Rec #:  [id number]       Height:       69.0 in Accession #:    [id number]      Weight:       197.5 lb Date of Birth:  [birth year]      BSA:          2.055 m Patient Age:    78 years        BP:           137/66 mmHg Patient Gender: M               HR:           101 bpm. Exam Location:  Inpatient Procedure: 2D Echo, Cardiac Doppler, Color Doppler and Intracardiac            Opacification Agent (Both Spectral and Color Flow Doppler were            utilized during procedure). Indications:    Atrial Flutter  History:        Patient has prior history of Echocardiogram examinations. CHF                  and CABG x4, CAD, Signs/Symptoms:Shortness of Breath; Risk                 Factors:Former Smoker, Hypertension and Diabetes.  Sonographer:    Willey Harrier Referring Phys: [id number] SUBRINA SUNDIL IMPRESSIONS  1. Left ventricular ejection fraction, by estimation, is 55 to 60%. The left ventricle has normal function. The left ventricle has no regional wall motion abnormalities. There is moderate left ventricular hypertrophy. Left ventricular diastolic parameters were normal.  2. Right ventricular systolic function is normal. The right ventricular size is normal.  3. The mitral valve is normal in structure. No evidence of mitral valve regurgitation. No evidence of mitral stenosis.  4. The aortic valve is tricuspid. Aortic valve regurgitation is not visualized. Aortic valve sclerosis/calcification is present, without any evidence of aortic stenosis.  5. The inferior vena cava is normal in size with greater than 50% respiratory variability, suggesting right atrial pressure of 3 mmHg. FINDINGS  Left Ventricle: Left ventricular ejection fraction, by estimation, is 55 to 60%. The left ventricle has normal function. The left ventricle has no regional wall motion abnormalities. Definity contrast agent was given IV to delineate the left ventricular  endocardial borders. The left ventricular  internal cavity size was normal in size. There is moderate left ventricular hypertrophy. Left ventricular diastolic parameters were normal. Right Ventricle: The right ventricular size is normal. Right ventricular systolic function is normal. Left Atrium: Left atrial size was normal in size. Right Atrium: Right atrial size was normal in size. Pericardium: There is no evidence of pericardial effusion. Mitral Valve: The mitral valve is normal in structure. No evidence of mitral valve regurgitation. No evidence of mitral valve stenosis. MV peak gradient, 3.4 mmHg. The mean mitral valve gradient is 2.0 mmHg. Tricuspid Valve: The tricuspid  valve is normal in structure. Tricuspid valve regurgitation is trivial. No evidence of tricuspid stenosis. Aortic Valve: The aortic valve is tricuspid. Aortic valve regurgitation is not visualized. Aortic valve sclerosis/calcification is present, without any evidence of aortic stenosis. Aortic valve peak gradient measures 10.5 mmHg. Pulmonic Valve: The pulmonic valve was normal in structure. Pulmonic valve regurgitation is trivial. No evidence of pulmonic stenosis. Aorta: The aortic root is normal in size and structure. Venous: The inferior vena cava is normal in size with greater than 50% respiratory variability, suggesting right atrial pressure of 3 mmHg. IAS/Shunts: No atrial level shunt detected by color flow Doppler.  LEFT VENTRICLE PLAX 2D LVIDd:         4.00 cm   Diastology LVIDs:         2.80 cm   LV e' medial:    8.92 cm/s LV PW:         1.40 cm   LV E/e' medial:  9.7 LV IVS:        1.50 cm   LV e' lateral:   10.80 cm/s LVOT diam:     1.90 cm   LV E/e' lateral: 8.0 LV SV:         56 LV SV Index:   27 LVOT Area:     2.84 cm  RIGHT VENTRICLE             IVC RV Basal diam:  3.70 cm     IVC diam: 1.40 cm RV S prime:     11.30 cm/s TAPSE (M-mode): 1.3 cm LEFT ATRIUM             Index        RIGHT ATRIUM           Index LA Vol (A2C):   48.1 ml 23.40 ml/m  RA Area:     12.80 cm LA Vol (A4C):   36.6 ml 17.81 ml/m  RA Volume:   26.40 ml  12.85 ml/m LA Biplane Vol: 44.0 ml 21.41 ml/m  AORTIC VALVE AV Area (Vmax): 1.93 cm AV Vmax:        162.00 cm/s AV Peak Grad:   10.5 mmHg LVOT Vmax:      110.00 cm/s LVOT Vmean:     70.700 cm/s LVOT VTI:       0.199 m  AORTA Ao Root diam: 3.10 cm Ao Asc diam:  2.90 cm MITRAL VALVE MV Area (PHT): 3.72 cm    SHUNTS MV Area VTI:   3.09 cm    Systemic VTI:  0.20 m MV Peak grad:  3.4 mmHg    Systemic Diam: 1.90 cm MV Mean grad:  2.0 mmHg MV Vmax:       0.92 m/s MV Vmean:      77.1 cm/s MV Decel Time: 204 msec MV E velocity: 86.50 cm/s MV A velocity: 96.40 cm/s MV E/A ratio:   0.90 Alexandria Angel MD Electronically  signed by Alexandria Angel MD Signature Date/Time: 09/17/2023/1:28:18 PM    Final    DG Abd 1 View Result Date: 09/17/2023 CLINICAL DATA:  Ileus.  Abdominal pain. EXAM: ABDOMEN - 1 VIEW COMPARISON:  CT 09/13/2023 FINDINGS: Gaseous small bowel distention in the central abdomen is not significantly changed compared to recent CT. No small volume of formed stool in the right colon. The catheter in the pelvis on prior exam is not definitively seen. Vascular calcifications. IMPRESSION: Gaseous small bowel distention in the central abdomen is not significantly changed compared to recent CT. Electronically Signed   By: Chadwick Colonel M.D.   On: 09/17/2023 11:25     Patient Profile     78 y.o. male with past medical history of coronary artery disease status post coronary bypass and graft, prior CVA, hypertension, hyperlipidemia, diabetes mellitus, end-stage renal disease peripheral vascular disease being seen for postoperative atrial flutter.  Patient admitted with acute appendicitis.  Patient started on dialysis this admission.  He also has had acute blood loss anemia.  He developed atrial flutter and cardiology asked to evaluate.  Echocardiogram shows ejection fraction 55 to 60%, moderate left ventricular hypertrophy.  Assessment & Plan    1 new onset atrial flutter-patient remains in sinus rhythm today.  IV amiodarone  was discontinued yesterday.  Will add amiodarone  200 mg daily by mouth.  Can discontinue in 6 to 8 weeks if he maintains sinus rhythm.  Atrial arrhythmias may have been secondary to recent acute perforated appendicitis necessitating surgery.  Would also add metoprolol  25 mg twice daily.  Would not add anticoagulation at this point as this may have been postoperative atrial arrhythmias and he also had recent bleed.    2 coronary artery disease status post coronary bypass and graft-continue statin.  He is not having chest pain.  3 end-stage renal  disease-dialysis per nephrology.  4 status post appendectomy-Per primary service.  For questions or updates, please contact [facility] Please consult [email or web address] for contact info under        Signed, Alexandria Angel, MD  09/19/2023, 7:39 AM

## 2023-09-20 DIAGNOSIS — I4892 Unspecified atrial flutter: Secondary | ICD-10-CM | POA: Diagnosis not present

## 2023-09-20 DIAGNOSIS — I2510 Atherosclerotic heart disease of native coronary artery without angina pectoris: Secondary | ICD-10-CM | POA: Diagnosis not present

## 2023-09-20 DIAGNOSIS — E7800 Pure hypercholesterolemia, unspecified: Secondary | ICD-10-CM

## 2023-09-20 LAB — COMPREHENSIVE METABOLIC PANEL WITH GFR
ALT: 21 U/L (ref 0–44)
AST: 30 U/L (ref 15–41)
Albumin: 2.4 g/dL — ABNORMAL LOW (ref 3.5–5.0)
Alkaline Phosphatase: 107 U/L (ref 38–126)
Anion gap: 17 — ABNORMAL HIGH (ref 5–15)
BUN: 48 mg/dL — ABNORMAL HIGH (ref 8–23)
CO2: 23 mmol/L (ref 22–32)
Calcium: 8.9 mg/dL (ref 8.9–10.3)
Chloride: 95 mmol/L — ABNORMAL LOW (ref 98–111)
Creatinine, Ser: 5.53 mg/dL — ABNORMAL HIGH (ref 0.61–1.24)
GFR, Estimated: 10 mL/min — ABNORMAL LOW (ref >60)
Glucose, Bld: 160 mg/dL — ABNORMAL HIGH (ref 70–99)
Potassium: 4.1 mmol/L (ref 3.5–5.1)
Sodium: 135 mmol/L (ref 135–145)
Total Bilirubin: 0.9 mg/dL (ref 0.0–1.2)
Total Protein: 7 g/dL (ref 6.5–8.1)

## 2023-09-20 LAB — PHOSPHORUS: Phosphorus: 6.6 mg/dL — ABNORMAL HIGH (ref 2.5–4.6)

## 2023-09-20 LAB — GLUCOSE, CAPILLARY
Glucose-Capillary: 111 mg/dL — ABNORMAL HIGH (ref 70–99)
Glucose-Capillary: 120 mg/dL — ABNORMAL HIGH (ref 70–99)
Glucose-Capillary: 127 mg/dL — ABNORMAL HIGH (ref 70–99)
Glucose-Capillary: 138 mg/dL — ABNORMAL HIGH (ref 70–99)
Glucose-Capillary: 139 mg/dL — ABNORMAL HIGH (ref 70–99)
Glucose-Capillary: 160 mg/dL — ABNORMAL HIGH (ref 70–99)

## 2023-09-20 LAB — TRIGLYCERIDES: Triglycerides: 196 mg/dL — ABNORMAL HIGH (ref <150)

## 2023-09-20 LAB — MAGNESIUM: Magnesium: 1.9 mg/dL (ref 1.7–2.4)

## 2023-09-20 MED ORDER — CALCIUM ACETATE (PHOS BINDER) 667 MG PO CAPS
667.0000 mg | ORAL_CAPSULE | Freq: Three times a day (TID) | ORAL | Status: DC
  Administered 2023-09-20 – 2023-10-01 (×21): 667 mg via ORAL
  Filled 2023-09-20 (×38): qty 1

## 2023-09-20 NOTE — Plan of Care (Signed)
 Problem: Education: Goal: Ability to describe self-care measures that may prevent or decrease complications (Diabetes Survival Skills Education) will improve 09/20/2023 0233 by Michela Aguas D, RN Outcome: Progressing 09/20/2023 0233 by Arlester Ladd Angelic Schnelle D, RN Outcome: Progressing Goal: Individualized Educational Video(s) 09/20/2023 0233 by Michela Aguas D, RN Outcome: Progressing 09/20/2023 0233 by Arlester Ladd, Aslin Farinas D, RN Outcome: Progressing   Problem: Coping: Goal: Ability to adjust to condition or change in health will improve 09/20/2023 0233 by Arlester Ladd, Jatin Naumann D, RN Outcome: Progressing 09/20/2023 0233 by Arlester Ladd, Deivi Huckins D, RN Outcome: Progressing   Problem: Fluid Volume: Goal: Ability to maintain a balanced intake and output will improve 09/20/2023 0233 by Michela Aguas D, RN Outcome: Progressing 09/20/2023 0233 by Arlester Ladd, Nayra Coury D, RN Outcome: Progressing   Problem: Health Behavior/Discharge Planning: Goal: Ability to identify and utilize available resources and services will improve 09/20/2023 0233 by Arlester Ladd, Creedon Danielski D, RN Outcome: Progressing 09/20/2023 0233 by Arlester Ladd, Erryn Dickison D, RN Outcome: Progressing Goal: Ability to manage health-related needs will improve 09/20/2023 0233 by Arlester Ladd, Raffi Milstein D, RN Outcome: Progressing 09/20/2023 0233 by Arlester Ladd, Miller Edgington D, RN Outcome: Progressing   Problem: Metabolic: Goal: Ability to maintain appropriate glucose levels will improve 09/20/2023 0233 by Michela Aguas D, RN Outcome: Progressing 09/20/2023 0233 by Arlester Ladd, Garrit Marrow D, RN Outcome: Progressing   Problem: Nutritional: Goal: Maintenance of adequate nutrition will improve 09/20/2023 0233 by Michela Aguas D, RN Outcome: Progressing 09/20/2023 0233 by Arlester Ladd, Khayree Delellis D, RN Outcome: Progressing Goal: Progress toward achieving an optimal weight will improve 09/20/2023 0233 by Michela Aguas D, RN Outcome: Progressing 09/20/2023 0233 by Arlester Ladd, Joniyah Mallinger D, RN Outcome: Progressing   Problem: Skin  Integrity: Goal: Risk for impaired skin integrity will decrease 09/20/2023 0233 by Michela Aguas D, RN Outcome: Progressing 09/20/2023 0233 by Arlester Ladd, Danah Reinecke D, RN Outcome: Progressing   Problem: Tissue Perfusion: Goal: Adequacy of tissue perfusion will improve 09/20/2023 0233 by Arlester Ladd, Shaila Gilchrest D, RN Outcome: Progressing 09/20/2023 0233 by Arlester Ladd, Ashrita Chrismer D, RN Outcome: Progressing   Problem: Education: Goal: Knowledge of General Education information will improve Description: Including pain rating scale, medication(s)/side effects and non-pharmacologic comfort measures 09/20/2023 0233 by Arlester Ladd, Dariyah Garduno D, RN Outcome: Progressing 09/20/2023 0233 by Arlester Ladd, Jaze Rodino D, RN Outcome: Progressing   Problem: Health Behavior/Discharge Planning: Goal: Ability to manage health-related needs will improve 09/20/2023 0233 by Arlester Ladd, Tykeisha Peer D, RN Outcome: Progressing 09/20/2023 0233 by Arlester Ladd, Harveer Sadler D, RN Outcome: Progressing   Problem: Clinical Measurements: Goal: Ability to maintain clinical measurements within normal limits will improve Outcome: Progressing Goal: Will remain free from infection Outcome: Progressing Goal: Diagnostic test results will improve Outcome: Progressing Goal: Respiratory complications will improve Outcome: Progressing Goal: Cardiovascular complication will be avoided Outcome: Progressing   Problem: Activity: Goal: Risk for activity intolerance will decrease Outcome: Progressing   Problem: Nutrition: Goal: Adequate nutrition will be maintained Outcome: Progressing   Problem: Coping: Goal: Level of anxiety will decrease Outcome: Progressing   Problem: Elimination: Goal: Will not experience complications related to bowel motility Outcome: Progressing Goal: Will not experience complications related to urinary retention Outcome: Progressing   Problem: Pain Managment: Goal: General experience of comfort will improve and/or be controlled Outcome: Progressing    Problem: Safety: Goal: Ability to remain free from injury will improve Outcome: Progressing   Problem: Skin Integrity: Goal: Risk for impaired skin integrity will decrease Outcome: Progressing   Problem: Education: Goal: Knowledge of disease and its progression will improve Outcome: Progressing Goal: Individualized Educational Video(s) Outcome: Progressing   Problem: Fluid Volume: Goal: Compliance  with measures to maintain balanced fluid volume will improve Outcome: Progressing   Problem: Health Behavior/Discharge Planning: Goal: Ability to manage health-related needs will improve Outcome: Progressing   Problem: Nutritional: Goal: Ability to make healthy dietary choices will improve Outcome: Progressing   Problem: Clinical Measurements: Goal: Complications related to the disease process, condition or treatment will be avoided or minimized Outcome: Progressing   Problem: Education: Goal: Understanding of CV disease, CV risk reduction, and recovery process will improve Outcome: Progressing Goal: Individualized Educational Video(s) Outcome: Progressing   Problem: Activity: Goal: Ability to return to baseline activity level will improve Outcome: Progressing   Problem: Cardiovascular: Goal: Ability to achieve and maintain adequate cardiovascular perfusion will improve Outcome: Progressing   Problem: Health Behavior/Discharge Planning: Goal: Ability to safely manage health-related needs after discharge will improve Outcome: Progressing

## 2023-09-20 NOTE — TOC Progression Note (Signed)
 Transition of Care [facility]) - Progression Note    Patient Details  Name: Christian [name] MRN: [id number] Date of Birth: [birth year]  Transition [facility] CM/SW Contact  Arron Big, [place] Phone Number: 09/20/2023, 11:46 AM  Clinical Narrative:   CSW awaiting updated PT note for insurance auth.   TOC will continue to follow.    Expected Discharge Plan: Skilled Nursing Facility Barriers to Discharge: Continued Medical Work up, English as a second language teacher  Expected Discharge Plan and Services In-house Referral: Clinical Social Work   Post Acute Care Choice: Skilled Nursing Facility Living arrangements for the past 2 months: Single Family Home                                       Social Determinants of Health (SDOH) Interventions SDOH Screenings   Food Insecurity: No Food Insecurity (08/31/2023)  Housing: Low Risk  (08/31/2023)  Transportation Needs: No Transportation Needs (08/31/2023)  Utilities: Not At Risk (08/31/2023)  Alcohol Screen: Low Risk  (07/23/2021)  Depression (PHQ2-9): Low Risk  (01/18/2023)  Financial Resource Strain: Low Risk  (07/23/2021)  Physical Activity: Inactive (07/23/2021)  Social Connections: Moderately Integrated (08/31/2023)  Stress: No Stress Concern Present (07/23/2021)  Tobacco Use: Medium Risk (09/16/2023)    Readmission Risk Interventions     No data to display

## 2023-09-20 NOTE — Progress Notes (Signed)
   General Surgery Follow Up Note  Subjective:    Overnight Issues:   Reports his crampy pain is somewhat improved but still present. Denies new complaints. Tolerating PO. Having bowel function.   Objective:  Vital signs for last 24 hours: Temp:  [98 F (36.7 C)-98.7 F (37.1 C)] 98.4 F (36.9 C) (05/26 0719) Pulse Rate:  [101-114] 114 (05/26 0719) Resp:  [16-30] 18 (05/26 0508) BP: (101-116)/(60-76) 115/70 (05/26 0508) SpO2:  [97 %-99 %] 97 % (05/26 0719) Weight:  [84.4 kg] 84.4 kg (05/26 0508)  Hemodynamic parameters for last 24 hours:    Intake/Output from previous day: 05/25 0701 - 05/26 0700 In: 480 [P.O.:480] Out: 600 [Urine:600]  Intake/Output this shift: No intake/output data recorded.  Vent settings for last 24 hours:    Physical Exam:  Gen: alert, NAD CV: HR 110s Pulm: on nasal cannula Abd: soft, no distention, incisions C/D/I Extr: wwp, no edema  Results for orders placed or performed during the hospital encounter of 08/30/23 (from the past 24 hours)  Glucose, capillary     Status: Abnormal   Collection Time: 09/19/23 12:19 PM  Result Value Ref Range   Glucose-Capillary 139 (H) 70 - 99 mg/dL  Glucose, capillary     Status: Abnormal   Collection Time: 09/19/23  4:29 PM  Result Value Ref Range   Glucose-Capillary 116 (H) 70 - 99 mg/dL  Glucose, capillary     Status: Abnormal   Collection Time: 09/19/23  8:13 PM  Result Value Ref Range   Glucose-Capillary 135 (H) 70 - 99 mg/dL  Glucose, capillary     Status: Abnormal   Collection Time: 09/20/23 12:07 AM  Result Value Ref Range   Glucose-Capillary 127 (H) 70 - 99 mg/dL  Glucose, capillary     Status: Abnormal   Collection Time: 09/20/23  5:05 AM  Result Value Ref Range   Glucose-Capillary 120 (H) 70 - 99 mg/dL  Glucose, capillary     Status: Abnormal   Collection Time: 09/20/23  7:18 AM  Result Value Ref Range   Glucose-Capillary 138 (H) 70 - 99 mg/dL  Triglycerides     Status: Abnormal    Collection Time: 09/20/23  7:58 AM  Result Value Ref Range   Triglycerides 196 (H) <150 mg/dL    Assessment & Plan: The plan of care was discussed with the bedside dialysis nurse for the day, who is in agreement with this plan and no additional concerns were raised.   Present on Admission:  Appendicitis  HTN (hypertension)  Ischemic cardiomyopathy  Acute renal failure superimposed on stage 4 chronic kidney disease (HCC)  CAD (coronary artery disease)  (Resolved) Acute appendicitis  ABLA (acute blood loss anemia)    LOS: 20 days    POD 18 - s/p lap appy with JP drain placement for perforated appendicitis [name] 5/6  - Ileus appears to have resolved. Tolerating PO with bowel function - Continue regular diet - AF and HDS. No indication for abx - encourage OOB, ambulation - Surgery will sign off. Please reach out with questions/concerns.       FEN - Regular diet  Lines/tubes - tunneled HD cath 5/22 VVS, PIV x2 RUE, LUE w/ AV Fistula (non-functional) VTE - SQ heparin  ID - [email or web address]  d/c 5/21   Cannon Champion, MD [facility] 09/20/2023, 8:54 AM Please see Amion for pager number during day hours 7:00am-4:30pm

## 2023-09-20 NOTE — Progress Notes (Addendum)
 Occupational Therapy Treatment Patient Details Name: Christian [name] MRN: [id number] DOB: 1946-03-29 Today's Date: 09/20/2023   History of present illness 78 y.o. male admitted 08/30/23 with RLQ pain, appendicitis and perforated appendix s/p laparoscopic appendectomy 5/6. Post op ileus. 5/23 Afib. PMHx: CAD s/p CABG, HTN, CKD, DMT2, BPH, multiple sclerosis, neuropathy, mild dementia   OT comments  Patient transferred from the bed to the recliner via the [facility].  Decreased effort and motivation for any attempted movement.  Increased encouragement needed throughout from family.  Max A for simple bed mobility, Max A for transition to the recliner with the Stedy.  Very little effort at any attempted seated exercise for upper and lower body.  OT can continue efforts in the acute setting to address deficits, but Patient will benefit from continued inpatient follow up therapy, <3 hours/day.  Hopefully with improved participation, he will begin to make positive functional gains.        If plan is discharge home, recommend the following:  Two people to help with walking and/or transfers;Two people to help with bathing/dressing/bathroom;Assistance with cooking/housework;Direct supervision/assist for medications management;Direct supervision/assist for financial management;Assist for transportation;Help with stairs or ramp for entrance   Equipment Recommendations  Wheelchair (measurements OT);Wheelchair cushion (measurements OT)    Recommendations for Other Services      Precautions / Restrictions Precautions Precautions: Fall Recall of Precautions/Restrictions: Impaired Precaution/Restrictions Comments: incontinent stool, watch sats Restrictions Weight Bearing Restrictions Per Provider Order: No       Mobility Bed Mobility Overal bed mobility: Needs Assistance Bed Mobility: Supine to Sit     Supine to sit: Mod assist, +2 for safety/equipment, +2 for physical assistance, HOB elevated        Patient Response: Cooperative  Transfers Overall transfer level: Needs assistance   Transfers: Sit to/from Stand Sit to Stand: Via lift equipment                 Balance Overall balance assessment: Needs assistance Sitting-balance support: No upper extremity supported, Feet supported Sitting balance-Leahy Scale: Poor   Postural control: Posterior lean Standing balance support: Reliant on assistive device for balance Standing balance-Leahy Scale: Poor                             ADL either performed or assessed with clinical judgement   ADL       Grooming: Minimal assistance;Bed level           Upper Body Dressing : Minimal assistance;Bed level   Lower Body Dressing: Maximal assistance;Bed level       Toileting- Clothing Manipulation and Hygiene: Maximal assistance;Bed level              Extremity/Trunk Assessment Upper Extremity Assessment Upper Extremity Assessment: Generalized weakness   Lower Extremity Assessment Lower Extremity Assessment: Defer to PT evaluation        Vision Patient Visual Report: No change from baseline     Perception Perception Perception: Not tested   Praxis Praxis Praxis: Not tested   Communication Communication Communication: Impaired Factors Affecting Communication: Difficulty expressing self;Reduced clarity of speech   Cognition Arousal: Alert Behavior During Therapy: Flat affect, Anxious Cognition: Cognition impaired       Memory impairment (select all impairments): Short-term memory, Working memory Attention impairment (select first level of impairment): Focused attention Executive functioning impairment (select all impairments): Initiation, Sequencing, Reasoning OT - Cognition Comments: required cues for participation  Following commands: Impaired Following commands impaired: Follows one step commands inconsistently      Cueing   Cueing Techniques: Verbal cues,  Gestural cues, Tactile cues  Exercises      Shoulder Instructions       General Comments      Pertinent Vitals/ Pain       Pain Assessment Pain Assessment: Faces Faces Pain Scale: Hurts even more Pain Location: everything Pain Descriptors / Indicators: Grimacing, Guarding, Sharp, Moaning Pain Intervention(s): Monitored during session, Premedicated before session                                                          Frequency  Min 2X/week        Progress Toward Goals  OT Goals(current goals can now be found in the care plan section)  Progress towards OT goals: Not progressing toward goals - comment (Poor motivation, decreased effort)  Acute Rehab OT Goals OT Goal Formulation: With patient Time For Goal Achievement: 10/04/23 Potential to Achieve Goals: Fair ADL Goals Pt Will Transfer to Toilet: with min assist;stand pivot transfer;bedside commode  Plan      Co-evaluation                 AM-PAC OT "6 Clicks" Daily Activity     Outcome Measure   Help from another person eating meals?: A Lot Help from another person taking care of personal grooming?: A Little Help from another person toileting, which includes using toliet, bedpan, or urinal?: A Lot Help from another person bathing (including washing, rinsing, drying)?: A Lot Help from another person to put on and taking off regular upper body clothing?: A Little Help from another person to put on and taking off regular lower body clothing?: A Lot 6 Click Score: 14    End of Session Equipment Utilized During Treatment: Other (comment);Oxygen Octaviano Belts)  OT Visit Diagnosis: Unsteadiness on feet (R26.81);Other abnormalities of gait and mobility (R26.89);Repeated falls (R29.6);Muscle weakness (generalized) (M62.81);Pain   Activity Tolerance Other (comment) (Poor motivation)   Patient Left in chair;with call bell/phone within reach;with chair alarm set;with family/visitor present    Nurse Communication Mobility status;Need for lift equipment        Time: 1430-1455 OT Time Calculation (min): 25 min  Charges: OT General Charges $OT Visit: 1 Visit OT Treatments $Self Care/Home Management : 8-22 mins $Therapeutic Activity: 8-22 mins  09/20/2023  RP, OTR/L  Acute Rehabilitation Services  Office:  [phone]   Christian [name] 09/20/2023, 3:00 PM

## 2023-09-20 NOTE — Progress Notes (Signed)
 Patient Name: Christian [name] Date of Encounter: 09/20/2023 [facility] Cardiologist: Knox Perl, MD  08/30/2023 .admit Length of stay: 20  Interval Summary  .    Christian [name] is a 78 y.o. AA male with hypertension, hyperlipidemia, DM with stage 3 CKD,  CAD with  stenting to the PDA on 02/27/2014. Patient presented with unstable angina and NSTEMI on 08/09/2019 along with a very small subsegmental pulmonary embolism and discharged on 10/18/2019 after he underwent CABG x4 on 08/15/2019.     He had extremely complex postop complications including cardiogenic shock, cardiac arrest, needing internal CPR in the ICU after opening the chest,  acute renal failure leading to need for dialysis, right brain infarct with left hemiparesis, multiple blood transfusions.  Admitted with acute appendicitis and acute on chronic kidney disease needing dialysis.  Patient developed transient A. FL on 09/17/23 post op but spontaneously converted to sinus but started on IV Amiodarone  prophylactic  and presently on PO Amiodarone  and Metoprolol .   No specific complaints.  States that his back is hurting.  Physical Exam    Vitals:   09/19/23 1918 09/19/23 2237 09/20/23 0508 09/20/23 0719  BP: 111/60 113/67 115/70   Pulse: (!) 113 (!) 101 (!) 114 (!) 114  Resp: 16 20 18    Temp: 98 F (36.7 C) 98.2 F (36.8 C) 98.3 F (36.8 C) 98.4 F (36.9 C)  TempSrc: Oral Oral Oral Oral  SpO2: 99% 99% 98% 97%  Weight:   84.4 kg   Height:       Physical Exam Constitutional:      Appearance: He is obese. He is ill-appearing.  Neck:     Vascular: No carotid bruit or JVD.  Cardiovascular:     Rate and Rhythm: Normal rate and regular rhythm.     Heart sounds: Normal heart sounds. No murmur heard.    No gallop.  Pulmonary:     Effort: Pulmonary effort is normal.     Breath sounds: Normal breath sounds.  Abdominal:     General: Bowel sounds are normal.     Palpations: Abdomen is soft.  Musculoskeletal:      Right lower leg: No edema.     Left lower leg: No edema.        09/20/2023    5:08 AM 09/19/2023    2:43 AM 09/18/2023    7:23 PM  Last 3 Weights  Weight (lbs) 186 lb 1.1 oz 179 lb 7.3 oz 187 lb 6.3 oz  Weight (kg) 84.4 kg 81.4 kg 85 kg      Labs   Lab Results  Component Value Date   NA 135 09/20/2023   K 4.1 09/20/2023   CO2 23 09/20/2023   GLUCOSE 160 (H) 09/20/2023   BUN 48 (H) 09/20/2023   CREATININE 5.53 (H) 09/20/2023   CALCIUM  8.9 09/20/2023   GFR 18.36 (L) 04/12/2023   EGFR 21.0 07/14/2023   GFRNONAA 10 (L) 09/20/2023       Latest Ref Rng & Units 09/20/2023    7:58 AM 09/19/2023    2:25 AM 09/18/2023    2:35 AM  BMP  Glucose 70 - 99 mg/dL 161  096  045   BUN 8 - 23 mg/dL 48  30  51   Creatinine 0.61 - 1.24 mg/dL 4.09  8.11  9.14   Sodium 135 - 145 mmol/L 135  137  136   Potassium 3.5 - 5.1 mmol/L 4.1  3.7  3.4  Chloride 98 - 111 mmol/L 95  95  94   CO2 22 - 32 mmol/L 23  24  25    Calcium  8.9 - 10.3 mg/dL 8.9  9.1  8.8        Latest Ref Rng & Units 09/18/2023    2:35 AM 09/17/2023    1:41 AM 09/16/2023   10:49 PM  CBC  WBC 4.0 - 10.5 K/uL 13.2   13.4   Hemoglobin 13.0 - 17.0 g/dL 8.0  7.8  8.6   Hematocrit 39.0 - 52.0 % 25.2  23.0  27.2   Platelets 150 - 400 K/uL 277   317     Lab Results  Component Value Date   CHOL 140 01/18/2023   HDL 60.60 01/18/2023   LDLCALC 47 01/18/2023   LDLDIRECT 40.0 11/23/2018   TRIG 196 (H) 09/20/2023   CHOLHDL 2 01/18/2023    Lab Results  Component Value Date   TSH 1.378 09/17/2023    Lab Results  Component Value Date   HGBA1C 7.3 (H) 04/12/2023   Intake/Output Summary (Last 24 hours) at 09/20/2023 0914 Last data filed at 09/19/2023 2100 Gross per 24 hour  Intake 480 ml  Output 600 ml  Net -120 ml    Tele/EKG/Cardiac studies    Telemetry normal sinus rhythm.   Echocardiogram 09/17/2023:  1. Left ventricular ejection fraction, by estimation, is 55 to 60%. The left ventricle has normal function. The  left ventricle has no regional wall motion abnormalities. There is moderate left ventricular hypertrophy. Left ventricular diastolic  parameters were normal.  2. Right ventricular systolic function is normal. The right ventricular size is normal.  3. The mitral valve is normal in structure. No evidence of mitral valve regurgitation. No evidence of mitral stenosis.  4. The aortic valve is tricuspid. Aortic valve regurgitation is not visualized. Aortic valve sclerosis/calcification is present, without any evidence of aortic stenosis.  5. The inferior vena cava is normal in size with greater than 50% respiratory variability, suggesting right atrial pressure of 3 mmHg.  Radiology    Current Meds:     Current Facility-Administered Medications:    acetaminophen  (TYLENOL ) tablet 650 mg, 650 mg, Oral, Q6H PRN, Feliz Ortiz, Abraham, MD, 650 mg at 09/19/23 2111   albuterol  (PROVENTIL ) (2.5 MG/3ML) 0.083% nebulizer solution 2.5 mg, 2.5 mg, Nebulization, Q4H PRN, Macdonald Savoy, MD   Chlorhexidine  Gluconate Cloth 2 % PADS 6 each, 6 each, Topical, Q0600, Macdonald Savoy, MD, 6 each at 09/20/23 0530   Darbepoetin Alfa  (ARANESP ) injection 100 mcg, 100 mcg, Subcutaneous, Q Thu-1800, Macdonald Savoy, MD, 100 mcg at 09/16/23 2054   dextrose  50 % solution 12.5 g, 12.5 g, Intravenous, PRN, Macdonald Savoy, MD, 12.5 g at 09/07/23 0557   feeding supplement (BOOST / RESOURCE BREEZE) liquid 1 Container, 1 Container, Oral, TID BM, Macdonald Savoy, MD, 1 Container at 09/19/23 2111   fluticasone  furoate-vilanterol (BREO ELLIPTA ) 200-25 MCG/ACT 1 puff, 1 puff, Inhalation, Daily, Macdonald Savoy, MD, 1 puff at 09/20/23 0814   heparin  injection 5,000 Units, 5,000 Units, Subcutaneous, Q8H, Macdonald Savoy, MD, 5,000 Units at 09/20/23 0540   insulin  aspart (novoLOG ) injection 0-20 Units, 0-20 Units, Subcutaneous, TID WC, Agarwala, Ravi, MD, 3 Units at 09/20/23 1610   LORazepam  (ATIVAN )  injection 0.5 mg, 0.5 mg, Intravenous, Q4H PRN, Macdonald Savoy, MD, 0.5 mg at 09/20/23 0825   methocarbamol  (ROBAXIN ) injection 500 mg, 500 mg, Intravenous, Q8H PRN, Macdonald Savoy, MD, 500 mg  at 09/07/23 2027   metoprolol  tartrate (LOPRESSOR ) injection 2.5 mg, 2.5 mg, Intravenous, Q6H PRN, Macdonald Savoy, MD, 2.5 mg at 09/18/23 2209   metoprolol  tartrate (LOPRESSOR ) tablet 25 mg, 25 mg, Oral, BID, Feliz Ortiz, Abraham, MD, 25 mg at 09/20/23 0825   naloxone  (NARCAN ) injection 0.4 mg, 0.4 mg, Intravenous, PRN, Macdonald Savoy, MD   ondansetron  (ZOFRAN ) injection 4 mg, 4 mg, Intravenous, Q6H PRN, Macdonald Savoy, MD, 4 mg at 09/18/23 2342   oxyCODONE -acetaminophen  (PERCOCET) 7.5-325 MG per tablet 2 tablet, 2 tablet, Oral, Q4H PRN, Macdonald Savoy, MD, 2 tablet at 09/19/23 2332   simethicone  (MYLICON) 40 MG/0.6ML suspension 40 mg, 40 mg, Oral, QID PRN, Feliz Ortiz, Abraham, MD, 40 mg at 09/05/23 0218   sodium chloride  flush (NS) 0.9 % injection 3 mL, 3 mL, Intravenous, Q12H, Macdonald Savoy, MD, 3 mL at 09/20/23 0826   sodium chloride  flush (NS) 0.9 % injection 3 mL, 3 mL, Intravenous, PRN, Macdonald Savoy, MD  Assessment & Plan .     1.  Transient a flutter back in sinus rhythm, not a candidate for anticoagulation. Continue p.o. amiodarone , plans on discontinuing amiodarone  when medically he is stable in the outpatient basis.  Not a candidate for anticoagulation in view of recent GI bleed, postop anemia, multiple medical comorbidity. 2.  Coronary artery disease of native vessel without angina pectoris He is fortunately doing well without recurrence of angina, tolerating metoprolol  tartrate 25 mg p.o. twice daily, he was on atorvastatin  20 mg in the outpatient basis, will repeat initiate this as oral intake has been allowed. Previously in view of severe peripheral arterial disease and coronary artery disease he was on Plavix  75 mg daily, presently off of  antiplatelet agents and anticoagulants, consider initiation of either Plavix  or aspirin  when medically stable. 3.  Hypercholesterolemia Lipids were well-controlled on Lipitor 20 mg daily, will reinitiate Lipitor.  Will sign off for now please call if questions. For questions or updates, please contact [facility] Please consult [email or web address] for contact info under     Knox Perl, MD, [facility] 09/20/2023, 9:48 AM [facility] [address] [place], [place] [address] Phone: [phone]. Fax:  [phone]

## 2023-09-20 NOTE — Progress Notes (Signed)
 [facility] Progress Note   Assessment/ Plan:    Expand All Collapse All  [facility] NEPHROLOGY PROGRESS NOTE   Assessment/ Plan: Pt is a 78 y.o. yo male with CAD status post CABG, underwent laparoscopic appendectomy on 5/7 developed postop ileus, AKI on CKD.   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  UA with pyuria and kidney ultrasound without hydronephrosis. No sign of renal recovery with worsening azotemia. First HD on 5/14, second HD on 5/15.  Received third HD 5/17 with 1.5 L UF, tolerated well.  Continues on TTS schedule.  Ended up getting into trouble fluid wise with delayed HD 5/22- got in middle of the night urgently-  then routine HD on 5/24-  plan for next HD on 5/27 Renal navigator is aware of arranging outpatient HD as he gets closer to discharge.   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  On 5/14, the AV fistula is unable to cannulate even with small needle 17G.   TDC  with [facility], 5/22. [facility] to follow as an outpatient regarding his AV fistula   # Appendicitis with possible abscess and postop ileus: s/p  antibiotics and drain per surgical team.     # Acute blood loss anemia, CT angio negative for bleeding.  Transfuse as needed.  Started ESA 5/22.  No IV iron until infectious issues clearly resolved.     # CKD-MBD, hyperphosphatemia: Phosphorus stable and with poor p.o. is off sevelamer , PTH level 337, continue to monitor.   # Hypokalemia: Managed with HD, monitor lab.    Subjective:    Seen in room.  Sleeping, reports his back is itching.  For HD tomorrow.    Objective:   BP 115/70 (BP Location: Right Arm)   Pulse (!) 114   Temp 98.4 F (36.9 C) (Oral)   Resp 18   Ht 5\' 9"  (1.753 m)   Wt 84.4 kg   SpO2  97%   BMI 27.48 kg/m   Physical Exam: Gen:NAD CVS:RRR Resp:clear, muffled at bases Abd: soft. Slightly distended ION:GEXBM LE edema ACCESS: TDC and L AVF + T/B but has infiltration  Labs: BMET Recent Labs  Lab 09/15/23 0920 09/16/23 0435 09/16/23 1744 09/16/23 2249 09/17/23 0141 09/17/23 0232 09/18/23 0235 09/19/23 0225 09/20/23 0758  NA 140 142 144 142 143 143 136 137 135  K 3.9 3.9 4.6 4.5 4.7 4.5 3.4* 3.7 4.1  CL 103 104 107 108  --  104 94* 95* 95*  CO2 26 23 23 23   --  22 25 24 23   GLUCOSE 204* 167* 171* 180*  --  202* 229* 123* 160*  BUN 63* 83* 90* 93*  --  92* 51* 30* 48*  CREATININE 3.54* 4.04* 4.30* 4.28*  --  4.18* 3.44* 3.07* 5.53*  CALCIUM  8.7* 9.1 9.0 9.1  --  9.3 8.8* 9.1 8.9  PHOS 3.4 3.5 4.7*  --   --  4.3 4.9* 4.6 6.6*   CBC Recent Labs  Lab 09/14/23 0646 09/16/23 0435 09/16/23 2249 09/17/23 0141 09/18/23 0235  WBC 13.2* 11.8* 13.4*  --  13.2*  HGB 8.8* 8.5* 8.6* 7.8* 8.0*  HCT 27.3* 26.9* 27.2* 23.0* 25.2*  MCV 99.6 102.3* 103.4*  --  101.2*  PLT 371 322 317  --  277      Medications:     Chlorhexidine  Gluconate Cloth  6 each Topical Q0600   darbepoetin (ARANESP ) injection - DIALYSIS  100 mcg Subcutaneous Q Thu-1800   feeding supplement  1 Container Oral TID BM   fluticasone  furoate-vilanterol  1 puff Inhalation Daily   heparin  injection (subcutaneous)  5,000 Units Subcutaneous Q8H   insulin  aspart  0-20 Units Subcutaneous TID WC   metoprolol  tartrate  25 mg Oral BID   sodium chloride  flush  3 mL Intravenous Q12H     Leandra Pro MD 09/20/2023, 9:42 AM

## 2023-09-20 NOTE — Progress Notes (Signed)
 [facility] PROGRESS NOTE    Progress Note  Christian [name]  NWG:[id number] DOB: [birth year] DOA: 08/30/2023 PCP: [name], MD    Brief Narrative:   Christian [name] is an 78 y.o. male past medical history of CABG postop CVA, chronic kidney disease stage IV who underwent laparoscopic appendectomy by general surgery on 08/31/2023 developed a postoperative ileus, JP drain in place, and NG tube to suction developed acute kidney injury, nephrology was consulted and is undergoing hemodialysis, we were unable to use his old AV fistula so he underwent HD catheter placement on the right by IR on 09/08/2022, CT scan of the abdomen pelvis on 09/08/2022 showed GB drainage with hematoma and persistent postop ileus.  NG tube has since been removed.    Assessment/Plan:   Acute perforated Appendicitis abscess, status post surgical intervention 08/31/2023 and now has developed a postop ileus: Completed 14-day course of IV Zosyn  blood cultures remain negative. JP drain with minimal serosanguineous output. Out of bed to chair, working with physical therapy. Repeated CT scan on 09/08/2022 showed complex 5 x 11 x 4 complex fluid collection. ID was consulted due to persistent fever and leukocytosis on 09/13/2023 despite IV antibiotics General Surgery is more concerned about a hematoma rather than abscess. ID recommended to hold on antibiotics.   Further management per surgery. Passing gas just had a bowel movement this morning. Tolerating a regular diet.  Eating about 30% of 50% of his meals. Out of bed to chair work with physical therapy. Further management per surgery  ATN superimposed on stage 4 chronic kidney disease (HCC) With a baseline creatinine around 3.2-3.5. Probably developed ATN in the setting of hypotension and blood loss, nephrology was consulted HD catheter was placed now on dialysis. Vascular surgery was consulted fistulogram performed 09/07/2023 with balloon venoplasty to the left  fistula. Underwent right IJ temporary catheter placement by IR on 09/08/2022.  Removed on 09/16/2023. Vascular placed tunneled dialysis catheter placement on 09/16/2023. First hemodialysis wound 09/08/2023. Continue dialysis per renal, next dialysis on 09/21/2023 Further management per nephrology.  Acute blood loss anemia: Hemoglobin around 10 there was a drop to 7.5, s/p 1 unit packed red blood cells his hemoglobin has remained relatively stable. Hemoglobin this morning is 8.0.  A-fib with RVR: Cardiology was consulted. Now on oral amiodarone  and metoprolol , continue amiodarone  6 to 8 weeks if he maintains sinus he can be discontinued. Is likely secondary to perforated appendicitis. Chads Vascor greater than 3 Naw in sinus rhythm. He is not a candidate for anticoagulation at this time. Further management per cardiology.  Essential hypertension: Currently on IV metoprolol  every 6 hours, and hydralazine  10 every 6 hours as needed. On oral metoprolol .  Diabetes mellitus type 2: TPN had held as right IJ was discontinued. Now on sliding scale in the blood glucose fairly controlled. Tolerating regular diet.  CAD: Evaluated by cardiology no further ischemic evaluation.  Thrombocytosis: Likely reactive.  Electrolyte imbalance hypokalemia/hypomagnesemia: Potassium is 3.4 phosphorus 4.9. Continue correction per renal.  Panic attack: He required 2 to 3 L of oxygen. VQ scan showed very low probability.  Severe protein caloric malnutrition: Tolerating a diet continue regular diet.  Back pain: Increase Dilaudid .   DVT prophylaxis: heparin  Family Communication:none Status is: Inpatient Remains inpatient appropriate because: Acute appendicitis now with acute kidney injury    Code Status:     Code Status Orders  (From admission, onward)           Start  Ordered   08/31/23 0416  Full code  Continuous       Question:  By:  Answer:  Consent: discussion documented in  EHR   08/31/23 0416           Code Status History     Date Active Date Inactive Code Status Order ID Comments User Context   12/12/2020 0212 12/16/2020 1900 Full Code [id number]  Dea Evert, DO ED   10/03/2020 1828 10/07/2020 1752 Full Code [id number]  Ozell Blunt, MD Inpatient   08/09/2019 1913 10/18/2019 1749 Full Code [id number]  Knox Perl, MD ED   09/27/2014 1331 10/01/2014 2151 Full Code [id number]  Annamarie Kid, MD Inpatient   02/27/2014 1233 02/28/2014 1432 Full Code [id number]  Knox Perl, MD Inpatient         IV Access:   Peripheral IV   Procedures and diagnostic studies:   No results found.    Medical Consultants:   None.   Subjective:    Christian [name] had a movement yesterday.  Objective:    Vitals:   09/19/23 1918 09/19/23 2237 09/20/23 0508 09/20/23 0719  BP: 111/60 113/67 115/70   Pulse: (!) 113 (!) 101 (!) 114 (!) 114  Resp: 16 20 18    Temp: 98 F (36.7 C) 98.2 F (36.8 C) 98.3 F (36.8 C) 98.4 F (36.9 C)  TempSrc: Oral Oral Oral Oral  SpO2: 99% 99% 98% 97%  Weight:   84.4 kg   Height:       SpO2: 97 % O2 Flow Rate (L/min): 2 L/min FiO2 (%): 40 %   Intake/Output Summary (Last 24 hours) at 09/20/2023 0739 Last data filed at 09/19/2023 2100 Gross per 24 hour  Intake 480 ml  Output 600 ml  Net -120 ml   Filed Weights   09/18/23 1923 09/19/23 0243 09/20/23 0508  Weight: 85 kg 81.4 kg 84.4 kg    Exam: General exam: In no acute distress. Respiratory system: Good air movement and clear to auscultation. Cardiovascular system: S1 & S2 heard, RRR. No JVD. Gastrointestinal system: Abdomen is nondistended, soft and nontender.  Extremities: No pedal edema. Skin: No rashes, lesions or ulcers Psychiatry: Judgement and insight appear normal. Mood & affect appropriate.  Data Reviewed:    Labs: Basic Metabolic Panel: Recent Labs  Lab 09/15/23 0920 09/16/23 0435 09/16/23 1744 09/16/23 2249 09/17/23 0141 09/17/23 0232  09/18/23 0235 09/19/23 0225  NA 140 142 144 142 143 143 136 137  K 3.9 3.9 4.6 4.5 4.7 4.5 3.4* 3.7  CL 103 104 107 108  --  104 94* 95*  CO2 26 23 23 23   --  22 25 24   GLUCOSE 204* 167* 171* 180*  --  202* 229* 123*  BUN 63* 83* 90* 93*  --  92* 51* 30*  CREATININE 3.54* 4.04* 4.30* 4.28*  --  4.18* 3.44* 3.07*  CALCIUM  8.7* 9.1 9.0 9.1  --  9.3 8.8* 9.1  MG 1.6* 1.7  --  2.2  --  2.3  --   --   PHOS 3.4 3.5 4.7*  --   --  4.3 4.9* 4.6   GFR Estimated Creatinine Clearance: 20.5 mL/min (A) (by C-G formula based on SCr of 3.07 mg/dL (H)). Liver Function Tests: Recent Labs  Lab 09/16/23 0435 09/16/23 1744 09/17/23 0232 09/18/23 0235 09/19/23 0225  AST 102*  --  70* 47*  --   ALT 105*  --  73* 39  --   ALKPHOS 97  --  108 99  --   BILITOT 0.8  --  0.6 0.7  --   PROT 6.3*  --  7.2 6.7  --   ALBUMIN  1.8* 2.1* 2.4* 2.4*  2.4* 2.6*   No results for input(s): "LIPASE", "AMYLASE" in the last 168 hours. No results for input(s): "AMMONIA" in the last 168 hours. Coagulation profile No results for input(s): "INR", "PROTIME" in the last 168 hours. COVID-19 Labs  No results for input(s): "DDIMER", "FERRITIN", "LDH", "CRP" in the last 72 hours.  Lab Results  Component Value Date   SARSCOV2NAA NEGATIVE 12/11/2020   SARSCOV2NAA NEGATIVE 10/03/2020   SARSCOV2NAA NEGATIVE 10/17/2019   SARSCOV2NAA NEGATIVE 10/13/2019    CBC: Recent Labs  Lab 09/14/23 0646 09/16/23 0435 09/16/23 2249 09/17/23 0141 09/18/23 0235  WBC 13.2* 11.8* 13.4*  --  13.2*  HGB 8.8* 8.5* 8.6* 7.8* 8.0*  HCT 27.3* 26.9* 27.2* 23.0* 25.2*  MCV 99.6 102.3* 103.4*  --  101.2*  PLT 371 322 317  --  277   Cardiac Enzymes: No results for input(s): "CKTOTAL", "CKMB", "CKMBINDEX", "TROPONINI" in the last 168 hours. BNP (last 3 results) No results for input(s): "PROBNP" in the last 8760 hours. CBG: Recent Labs  Lab 09/19/23 1629 09/19/23 2013 09/20/23 0007 09/20/23 0505 09/20/23 0718  GLUCAP 116* 135*  127* 120* 138*   D-Dimer: No results for input(s): "DDIMER" in the last 72 hours. Hgb A1c: No results for input(s): "HGBA1C" in the last 72 hours. Lipid Profile: No results for input(s): "CHOL", "HDL", "LDLCALC", "TRIG", "CHOLHDL", "LDLDIRECT" in the last 72 hours. Thyroid  function studies: No results for input(s): "TSH", "T4TOTAL", "T3FREE", "THYROIDAB" in the last 72 hours.  Invalid input(s): "FREET3"  Anemia work up: No results for input(s): "VITAMINB12", "FOLATE", "FERRITIN", "TIBC", "IRON", "RETICCTPCT" in the last 72 hours. Sepsis Labs: Recent Labs  Lab 09/14/23 0646 09/16/23 0435 09/16/23 2249 09/18/23 0235  WBC 13.2* 11.8* 13.4* 13.2*   Microbiology Recent Results (from the past 240 hours)  Culture, blood (Routine X 2) w Reflex to ID Panel     Status: None   Collection Time: 09/13/23 10:11 AM   Specimen: BLOOD RIGHT HAND  Result Value Ref Range Status   Specimen Description BLOOD RIGHT HAND  Final   Special Requests   Final    BOTTLES DRAWN AEROBIC AND ANAEROBIC Blood Culture adequate volume   Culture   Final    NO GROWTH 5 DAYS Performed at [facility], [address], [place], [place] [address]    Report Status 09/18/2023 FINAL  Final  Culture, blood (Routine X 2) w Reflex to ID Panel     Status: None   Collection Time: 09/13/23 10:11 AM   Specimen: BLOOD RIGHT HAND  Result Value Ref Range Status   Specimen Description BLOOD RIGHT HAND  Final   Special Requests   Final    BOTTLES DRAWN AEROBIC AND ANAEROBIC Blood Culture adequate volume   Culture   Final    NO GROWTH 5 DAYS Performed at [facility], [address], [place], [place] [address]    Report Status 09/18/2023 FINAL  Final     Medications:    Chlorhexidine  Gluconate Cloth  6 each Topical Q0600   darbepoetin (ARANESP ) injection - DIALYSIS  100 mcg Subcutaneous Q Thu-1800   feeding supplement  1 Container Oral TID BM   fluticasone  furoate-vilanterol  1 puff Inhalation Daily    heparin  injection (subcutaneous)  5,000 Units Subcutaneous Q8H   insulin  aspart  0-20 Units Subcutaneous TID WC   metoprolol  tartrate  25 mg Oral BID   sodium chloride  flush  3 mL Intravenous Q12H   Continuous Infusions:      LOS: 20 days   Macdonald Savoy  [facility]  09/20/2023, 7:39 AM

## 2023-09-21 ENCOUNTER — Other Ambulatory Visit (HOSPITAL_COMMUNITY): Payer: Self-pay

## 2023-09-21 DIAGNOSIS — I4892 Unspecified atrial flutter: Secondary | ICD-10-CM | POA: Diagnosis not present

## 2023-09-21 LAB — GLUCOSE, CAPILLARY
Glucose-Capillary: 111 mg/dL — ABNORMAL HIGH (ref 70–99)
Glucose-Capillary: 108 mg/dL — ABNORMAL HIGH (ref 70–99)
Glucose-Capillary: 120 mg/dL — ABNORMAL HIGH (ref 70–99)
Glucose-Capillary: 121 mg/dL — ABNORMAL HIGH (ref 70–99)
Glucose-Capillary: 93 mg/dL (ref 70–99)

## 2023-09-21 LAB — RENAL FUNCTION PANEL
Albumin: 2.3 g/dL — ABNORMAL LOW (ref 3.5–5.0)
Anion gap: 16 — ABNORMAL HIGH (ref 5–15)
BUN: 55 mg/dL — ABNORMAL HIGH (ref 8–23)
CO2: 25 mmol/L (ref 22–32)
Calcium: 9 mg/dL (ref 8.9–10.3)
Chloride: 95 mmol/L — ABNORMAL LOW (ref 98–111)
Creatinine, Ser: 6.64 mg/dL — ABNORMAL HIGH (ref 0.61–1.24)
GFR, Estimated: 8 mL/min — ABNORMAL LOW (ref >60)
Glucose, Bld: 119 mg/dL — ABNORMAL HIGH (ref 70–99)
Phosphorus: 6.8 mg/dL — ABNORMAL HIGH (ref 2.5–4.6)
Potassium: 3.9 mmol/L (ref 3.5–5.1)
Sodium: 136 mmol/L (ref 135–145)

## 2023-09-21 MED ORDER — AMIODARONE HCL 200 MG PO TABS
200.0000 mg | ORAL_TABLET | Freq: Two times a day (BID) | ORAL | Status: AC
  Administered 2023-09-21 – 2023-09-25 (×10): 200 mg via ORAL
  Filled 2023-09-21 (×11): qty 1

## 2023-09-21 MED ORDER — METOPROLOL TARTRATE 50 MG PO TABS
50.0000 mg | ORAL_TABLET | Freq: Two times a day (BID) | ORAL | Status: DC
  Administered 2023-09-21 – 2023-09-22 (×4): 50 mg via ORAL
  Filled 2023-09-21 (×8): qty 1

## 2023-09-21 MED ORDER — HEPARIN SODIUM (PORCINE) 1000 UNIT/ML IJ SOLN
3800.0000 [IU] | Freq: Once | INTRAMUSCULAR | Status: AC
  Administered 2023-09-21: 3800 [IU]

## 2023-09-21 MED ORDER — HEPARIN SODIUM (PORCINE) 1000 UNIT/ML IJ SOLN
INTRAMUSCULAR | Status: AC
  Filled 2023-09-21: qty 4

## 2023-09-21 NOTE — Progress Notes (Signed)
 Received patient in bed.Alert and oriented x 4. Consent verified.  Access used : Right hd catheter that worked well.Dressing on date.  Duration of treatment: 3.5 hours.  Uf goal:2.2 liters.Not tolerating prescribed uf.  Hand off to the patient's nurse.Back into his room with stable condition via transporter.

## 2023-09-21 NOTE — Addendum Note (Signed)
 Addendum  created 09/21/23 1417 by Gaye Kato, CRNA   Intraprocedure Meds edited

## 2023-09-21 NOTE — Progress Notes (Addendum)
 PT Cancellation Note  Patient Details Name: Christian [name] MRN: [id number] DOB: Aug 03, 1945   Cancelled Treatment:    Reason Eval/Treat Not Completed: (P) Patient at procedure or test/unavailable, pt off unit at HD. Will check back as schedule allows to continue with PT POC.  3:25p - re-attempted with pt declining mobility due to post HD fatigue.   Christian [name]. PTA Acute Rehabilitation Services Office: [phone]    Christian [name] 09/21/2023, 11:09 AM

## 2023-09-21 NOTE — Plan of Care (Signed)
  Problem: Education: Goal: Ability to describe self-care measures that may prevent or decrease complications (Diabetes Survival Skills Education) will improve Outcome: Progressing Goal: Individualized Educational Video(s) Outcome: Progressing   Problem: Coping: Goal: Ability to adjust to condition or change in health will improve Outcome: Progressing   Problem: Fluid Volume: Goal: Ability to maintain a balanced intake and output will improve Outcome: Progressing   Problem: Health Behavior/Discharge Planning: Goal: Ability to identify and utilize available resources and services will improve Outcome: Progressing Goal: Ability to manage health-related needs will improve Outcome: Progressing   Problem: Metabolic: Goal: Ability to maintain appropriate glucose levels will improve Outcome: Progressing   Problem: Nutritional: Goal: Maintenance of adequate nutrition will improve Outcome: Progressing Goal: Progress toward achieving an optimal weight will improve Outcome: Progressing   Problem: Skin Integrity: Goal: Risk for impaired skin integrity will decrease Outcome: Progressing   Problem: Tissue Perfusion: Goal: Adequacy of tissue perfusion will improve Outcome: Progressing   Problem: Education: Goal: Knowledge of General Education information will improve Description: Including pain rating scale, medication(s)/side effects and non-pharmacologic comfort measures Outcome: Progressing   Problem: Health Behavior/Discharge Planning: Goal: Ability to manage health-related needs will improve Outcome: Progressing   Problem: Clinical Measurements: Goal: Ability to maintain clinical measurements within normal limits will improve Outcome: Progressing Goal: Will remain free from infection Outcome: Progressing Goal: Diagnostic test results will improve Outcome: Progressing Goal: Respiratory complications will improve Outcome: Progressing Goal: Cardiovascular complication will  be avoided Outcome: Progressing   Problem: Activity: Goal: Risk for activity intolerance will decrease Outcome: Progressing   Problem: Nutrition: Goal: Adequate nutrition will be maintained Outcome: Progressing   Problem: Coping: Goal: Level of anxiety will decrease Outcome: Progressing   Problem: Elimination: Goal: Will not experience complications related to bowel motility Outcome: Progressing Goal: Will not experience complications related to urinary retention Outcome: Progressing   Problem: Pain Managment: Goal: General experience of comfort will improve and/or be controlled Outcome: Progressing   Problem: Safety: Goal: Ability to remain free from injury will improve Outcome: Progressing   Problem: Skin Integrity: Goal: Risk for impaired skin integrity will decrease Outcome: Progressing   Problem: Education: Goal: Knowledge of disease and its progression will improve Outcome: Progressing Goal: Individualized Educational Video(s) Outcome: Progressing   Problem: Fluid Volume: Goal: Compliance with measures to maintain balanced fluid volume will improve Outcome: Progressing   Problem: Health Behavior/Discharge Planning: Goal: Ability to manage health-related needs will improve Outcome: Progressing   Problem: Nutritional: Goal: Ability to make healthy dietary choices will improve Outcome: Progressing   Problem: Clinical Measurements: Goal: Complications related to the disease process, condition or treatment will be avoided or minimized Outcome: Progressing   Problem: Education: Goal: Understanding of CV disease, CV risk reduction, and recovery process will improve Outcome: Progressing Goal: Individualized Educational Video(s) Outcome: Progressing   Problem: Activity: Goal: Ability to return to baseline activity level will improve Outcome: Progressing   Problem: Cardiovascular: Goal: Ability to achieve and maintain adequate cardiovascular perfusion  will improve Outcome: Progressing   Problem: Health Behavior/Discharge Planning: Goal: Ability to safely manage health-related needs after discharge will improve Outcome: Progressing

## 2023-09-21 NOTE — Procedures (Signed)
 Patient seen and examined on Hemodialysis. The procedure was supervised and I have made appropriate changes. BP (!) 114/98   Pulse (!) 127   Temp 98.4 F (36.9 C)   Resp (!) 32   Ht 5\' 9"  (1.753 m)   Wt 85.7 kg   SpO2 98%   BMI 27.90 kg/m   QB 400 mL/ min via TDC, UF goal 3.5L   Tolerating treatment without complaints at this time.   Leandra Pro MD [facility] [phone] 12:32 PM

## 2023-09-21 NOTE — Progress Notes (Signed)
 Contacted CSW to discuss pt's d/c plans and possible d/c timeframe. Will assist with out-pt HD clinic placement based on d/c plans and date. Will assist as needed.   Lauraine Polite Renal Navigator [phone]

## 2023-09-21 NOTE — Progress Notes (Signed)
 [facility] Progress Note   Assessment/ Plan:    Expand All Collapse All  [facility] NEPHROLOGY PROGRESS NOTE   Assessment/ Plan: Pt is a 78 y.o. yo male with CAD status post CABG, underwent laparoscopic appendectomy on 5/7 developed postop ileus, AKI on CKD.   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  UA with pyuria and kidney ultrasound without hydronephrosis. No sign of renal recovery with worsening azotemia. First HD on 5/14, second HD on 5/15.  Received third HD 5/17 with 1.5 L UF, tolerated well.  Continues on TTS schedule.  Ended up getting into trouble fluid wise with delayed HD 5/22- got in middle of the night urgently-  then routine HD on 5/24-  plan for next HD on 5/27 Renal navigator is aware of arranging outpatient HD as he gets closer to discharge.   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  On 5/14, the AV fistula is unable to cannulate even with small needle 17G.   TDC  with [facility], 5/22. [facility] to follow as an outpatient regarding his AV fistula   # Appendicitis with possible abscess and postop ileus: s/p  antibiotics and drain per surgical team.     # Acute blood loss anemia, CT angio negative for bleeding.  Transfuse as needed.  Started ESA 5/22.  No IV iron until infectious issues clearly resolved.     # CKD-MBD, hyperphosphatemia: Phosphorus stable and with poor p.o. is off sevelamer , PTH level 337, continue to monitor.   # Hypokalemia: Managed with HD, monitor lab.    Subjective:    Sleeping comfortably.  When awakened he starts to cry-- seems to start panicking- but quickly fell back asleep and seemed to remain comfortable.     Objective:   BP 119/61   Pulse (!) 127   Temp 98.4 F (36.9 C)   Resp (!)  32   Ht 5\' 9"  (1.753 m)   Wt 87.9 kg   SpO2 98%   BMI 28.62 kg/m   Physical Exam: Gen:NAD CVS:RRR Resp:clear, muffled at bases Abd: soft. Slightly distended ZOX:WRUEA LE edema ACCESS: TDC and L AVF + T/B but has infiltration  Labs: BMET Recent Labs  Lab 09/16/23 0435 09/16/23 1744 09/16/23 2249 09/17/23 0141 09/17/23 0232 09/18/23 0235 09/19/23 0225 09/20/23 0758 09/21/23 0219  NA 142 144 142 143 143 136 137 135 136  K 3.9 4.6 4.5 4.7 4.5 3.4* 3.7 4.1 3.9  CL 104 107 108  --  104 94* 95* 95* 95*  CO2 23 23 23   --  22 25 24 23 25   GLUCOSE 167* 171* 180*  --  202* 229* 123* 160* 119*  BUN 83* 90* 93*  --  92* 51* 30* 48* 55*  CREATININE 4.04* 4.30* 4.28*  --  4.18* 3.44* 3.07* 5.53* 6.64*  CALCIUM  9.1 9.0 9.1  --  9.3 8.8* 9.1 8.9 9.0  PHOS 3.5 4.7*  --   --  4.3 4.9* 4.6 6.6* 6.8*   CBC Recent Labs  Lab 09/16/23 0435 09/16/23 2249 09/17/23 0141 09/18/23 0235  WBC 11.8* 13.4*  --  13.2*  HGB 8.5* 8.6* 7.8* 8.0*  HCT 26.9* 27.2* 23.0* 25.2*  MCV 102.3* 103.4*  --  101.2*  PLT 322 317  --  277      Medications:     amiodarone   200 mg Oral BID   calcium  acetate  667 mg Oral TID WC   Chlorhexidine  Gluconate Cloth  6 each Topical Q0600   darbepoetin (ARANESP ) injection - DIALYSIS  100 mcg Subcutaneous Q Thu-1800   feeding supplement  1 Container Oral TID BM   fluticasone  furoate-vilanterol  1 puff Inhalation Daily   heparin  injection (subcutaneous)  5,000 Units Subcutaneous Q8H   insulin  aspart  0-20 Units Subcutaneous TID WC   metoprolol  tartrate  50 mg Oral BID   sodium chloride  flush  3 mL Intravenous Q12H     Leandra Pro MD 09/21/2023, 12:28 PM

## 2023-09-21 NOTE — Progress Notes (Signed)
 [facility] PROGRESS NOTE    Progress Note  Christian [name]  XBJ:[id number] DOB: 10-24-1945 DOA: 08/30/2023 PCP: [name], MD    Brief Narrative:   Christian [name] is an 78 y.o. male past medical history of CABG postop CVA, chronic kidney disease stage IV who underwent laparoscopic appendectomy by general surgery on 08/31/2023 developed a postoperative ileus, JP drain in place, and NG tube to suction developed acute kidney injury, nephrology was consulted and is undergoing hemodialysis, we were unable to use his old AV fistula so he underwent HD catheter placement on the right by IR on 09/08/2022, CT scan of the abdomen pelvis on 09/08/2022 showed GB drainage with hematoma and persistent postop ileus.  NG tube has since been removed.    Assessment/Plan:   Acute perforated Appendicitis abscess, status post surgical intervention 08/31/2023 and now has developed a postop ileus: Completed 14-day course of IV Zosyn  blood cultures remain negative. Repeated CT scan on 09/08/2022 showed complex 5 x 11 x 4 complex fluid collection. ID was consulted due to persistent fever and leukocytosis on 09/13/2023 despite IV antibiotics General Surgery is more concerned about a hematoma rather than abscess. ID recommended to hold on antibiotics.   Further management per surgery. Tolerating a regular diet.  Eating about 30% of 50% of his meals. Physical therapy evaluated the patient recommended skilled nursing facility placement. Further management per surgery. Transfer to Telemetry.  ATN superimposed on stage 4 chronic kidney disease (HCC) With a baseline creatinine around 3.2-3.5. Probably developed ATN in the setting of hypotension and blood loss, nephrology was consulted HD catheter was placed now on dialysis. Vascular surgery was consulted fistulogram performed 09/07/2023 with balloon venoplasty to the left fistula. Underwent right IJ temporary catheter placement by IR on 09/08/2022.  Removed  on 09/16/2023. Vascular placed tunneled dialysis catheter placement on 09/16/2023. First hemodialysis wound 09/08/2023. Continue dialysis per renal, next dialysis on 09/21/2023 Further management per nephrology.  Acute blood loss anemia: Hemoglobin around 10 there was a drop to 7.5, s/p 1 unit packed red blood cells his hemoglobin has remained relatively stable. Hemoglobin this morning is 8.0.  A-fib with RVR: Cardiology was consulted. Now on oral amiodarone  and metoprolol , continue amiodarone  until medically stable.   Is likely secondary to perforated appendicitis. Chads Vascor greater than 3 Now in sinus rhythm. He is not a candidate for anticoagulation at this time. Further management per cardiology.  Essential hypertension: Currently on IV metoprolol  every 6 hours, and hydralazine  10 every 6 hours as needed. On oral metoprolol .  Diabetes mellitus type 2: TPN had held as right IJ was discontinued. Now on sliding scale in the blood glucose fairly controlled. Tolerating regular diet.  CAD: Evaluated by cardiology no further ischemic evaluation.  Thrombocytosis: Likely reactive.  Electrolyte imbalance hypokalemia/hypomagnesemia: Potassium is 3.4 phosphorus 4.9. Continue correction per renal.  Panic attack: He required 2 to 3 L of oxygen. VQ scan showed very low probability.  Severe protein caloric malnutrition: Tolerating a diet continue regular diet.  Back pain: Increase Dilaudid .   DVT prophylaxis: heparin  Family Communication:none Status is: Inpatient Remains inpatient appropriate because: Acute appendicitis now with acute kidney injury    Code Status:     Code Status Orders  (From admission, onward)           Start     Ordered   08/31/23 0416  Full code  Continuous       Question:  By:  Answer:  Consent: discussion documented in EHR  08/31/23 0416           Code Status History     Date Active Date Inactive Code Status Order ID Comments  User Context   12/12/2020 0212 12/16/2020 1900 Full Code [id number]  Dea Evert, DO ED   10/03/2020 1828 10/07/2020 1752 Full Code [id number]  Ozell Blunt, MD Inpatient   08/09/2019 1913 10/18/2019 1749 Full Code [id number]  Knox Perl, MD ED   09/27/2014 1331 10/01/2014 2151 Full Code [id number]  Annamarie Kid, MD Inpatient   02/27/2014 1233 02/28/2014 1432 Full Code [id number]  Knox Perl, MD Inpatient         IV Access:   Peripheral IV   Procedures and diagnostic studies:   No results found.    Medical Consultants:   None.   Subjective:    Christian [name] has no new complaints this morning. Objective:    Vitals:   09/20/23 2329 09/21/23 0437 09/21/23 0533 09/21/23 0746  BP: (!) 142/81 107/75  124/64  Pulse: 100 (!) 106    Resp: 16 16    Temp: 97.9 F (36.6 C) 97.9 F (36.6 C)  98.8 F (37.1 C)  TempSrc: Oral Oral  Oral  SpO2: 98% 99%  97%  Weight:   83.3 kg   Height:       SpO2: 97 % O2 Flow Rate (L/min): 2 L/min FiO2 (%): 40 %   Intake/Output Summary (Last 24 hours) at 09/21/2023 0757 Last data filed at 09/21/2023 0752 Gross per 24 hour  Intake 600 ml  Output 150 ml  Net 450 ml   Filed Weights   09/19/23 0243 09/20/23 0508 09/21/23 0533  Weight: 81.4 kg 84.4 kg 83.3 kg    Exam: General exam: In no acute distress. Respiratory system: Good air movement and clear to auscultation. Cardiovascular system: S1 & S2 heard, RRR. No JVD. Gastrointestinal system: Abdomen is nondistended, soft and nontender.  Extremities: No pedal edema. Skin: No rashes, lesions or ulcers Psychiatry: Judgement and insight appear normal. Mood & affect appropriate.  Data Reviewed:    Labs: Basic Metabolic Panel: Recent Labs  Lab 09/15/23 0920 09/16/23 0435 09/16/23 1744 09/16/23 2249 09/17/23 0141 09/17/23 3875 09/18/23 0235 09/19/23 0225 09/20/23 0758 09/21/23 0219  NA 140 142   < > 142   < > 143 136 137 135 136  K 3.9 3.9   < > 4.5   < > 4.5 3.4* 3.7 4.1  3.9  CL 103 104   < > 108  --  104 94* 95* 95* 95*  CO2 26 23   < > 23  --  22 25 24 23 25   GLUCOSE 204* 167*   < > 180*  --  202* 229* 123* 160* 119*  BUN 63* 83*   < > 93*  --  92* 51* 30* 48* 55*  CREATININE 3.54* 4.04*   < > 4.28*  --  4.18* 3.44* 3.07* 5.53* 6.64*  CALCIUM  8.7* 9.1   < > 9.1  --  9.3 8.8* 9.1 8.9 9.0  MG 1.6* 1.7  --  2.2  --  2.3  --   --  1.9  --   PHOS 3.4 3.5   < >  --   --  4.3 4.9* 4.6 6.6* 6.8*   < > = values in this interval not displayed.   GFR Estimated Creatinine Clearance: 9.5 mL/min (A) (by C-G formula based on SCr of 6.64 mg/dL (H)). Liver Function Tests: Recent Labs  Lab 09/16/23 0435 09/16/23 1744 09/17/23 0232 09/18/23 0235 09/19/23 0225 09/20/23 0758 09/21/23 0219  AST 102*  --  70* 47*  --  30  --   ALT 105*  --  73* 39  --  21  --   ALKPHOS 97  --  108 99  --  107  --   BILITOT 0.8  --  0.6 0.7  --  0.9  --   PROT 6.3*  --  7.2 6.7  --  7.0  --   ALBUMIN  1.8*   < > 2.4* 2.4*  2.4* 2.6* 2.4* 2.3*   < > = values in this interval not displayed.   No results for input(s): "LIPASE", "AMYLASE" in the last 168 hours. No results for input(s): "AMMONIA" in the last 168 hours. Coagulation profile No results for input(s): "INR", "PROTIME" in the last 168 hours. COVID-19 Labs  No results for input(s): "DDIMER", "FERRITIN", "LDH", "CRP" in the last 72 hours.  Lab Results  Component Value Date   SARSCOV2NAA NEGATIVE 12/11/2020   SARSCOV2NAA NEGATIVE 10/03/2020   SARSCOV2NAA NEGATIVE 10/17/2019   SARSCOV2NAA NEGATIVE 10/13/2019    CBC: Recent Labs  Lab 09/16/23 0435 09/16/23 2249 09/17/23 0141 09/18/23 0235  WBC 11.8* 13.4*  --  13.2*  HGB 8.5* 8.6* 7.8* 8.0*  HCT 26.9* 27.2* 23.0* 25.2*  MCV 102.3* 103.4*  --  101.2*  PLT 322 317  --  277   Cardiac Enzymes: No results for input(s): "CKTOTAL", "CKMB", "CKMBINDEX", "TROPONINI" in the last 168 hours. BNP (last 3 results) No results for input(s): "PROBNP" in the last 8760  hours. CBG: Recent Labs  Lab 09/20/23 1613 09/20/23 1931 09/20/23 2332 09/21/23 0432 09/21/23 0751  GLUCAP 160* 111* 139* 111* 108*   D-Dimer: No results for input(s): "DDIMER" in the last 72 hours. Hgb A1c: No results for input(s): "HGBA1C" in the last 72 hours. Lipid Profile: Recent Labs    09/20/23 0758  TRIG 196*   Thyroid  function studies: No results for input(s): "TSH", "T4TOTAL", "T3FREE", "THYROIDAB" in the last 72 hours.  Invalid input(s): "FREET3"  Anemia work up: No results for input(s): "VITAMINB12", "FOLATE", "FERRITIN", "TIBC", "IRON", "RETICCTPCT" in the last 72 hours. Sepsis Labs: Recent Labs  Lab 09/16/23 0435 09/16/23 2249 09/18/23 0235  WBC 11.8* 13.4* 13.2*   Microbiology Recent Results (from the past 240 hours)  Culture, blood (Routine X 2) w Reflex to ID Panel     Status: None   Collection Time: 09/13/23 10:11 AM   Specimen: BLOOD RIGHT HAND  Result Value Ref Range Status   Specimen Description BLOOD RIGHT HAND  Final   Special Requests   Final    BOTTLES DRAWN AEROBIC AND ANAEROBIC Blood Culture adequate volume   Culture   Final    NO GROWTH 5 DAYS Performed at [facility], [address] [place], [place] [address]    Report Status 09/18/2023 FINAL  Final  Culture, blood (Routine X 2) w Reflex to ID Panel     Status: None   Collection Time: 09/13/23 10:11 AM   Specimen: BLOOD RIGHT HAND  Result Value Ref Range Status   Specimen Description BLOOD RIGHT HAND  Final   Special Requests   Final    BOTTLES DRAWN AEROBIC AND ANAEROBIC Blood Culture adequate volume   Culture   Final    NO GROWTH 5 DAYS Performed at [facility], [address], [place], [place] [address]    Report Status 09/18/2023  FINAL  Final     Medications:    calcium  acetate  667 mg Oral TID WC   Chlorhexidine  Gluconate Cloth  6 each Topical Q0600   darbepoetin (ARANESP ) injection - DIALYSIS  100 mcg Subcutaneous Q Thu-1800   feeding supplement  1  Container Oral TID BM   fluticasone  furoate-vilanterol  1 puff Inhalation Daily   heparin  injection (subcutaneous)  5,000 Units Subcutaneous Q8H   insulin  aspart  0-20 Units Subcutaneous TID WC   metoprolol  tartrate  25 mg Oral BID   sodium chloride  flush  3 mL Intravenous Q12H   Continuous Infusions:      LOS: 21 days   Macdonald Savoy  [facility]  09/21/2023, 7:57 AM

## 2023-09-21 NOTE — TOC Progression Note (Addendum)
 Transition of Care [facility]) - Progression Note    Patient Details  Name: Christian [name] MRN: [id number] Date of Birth: [birth year]  [facility] CM/SW Contact  Juliane Och, LCSW Phone Number: 09/21/2023, 3:34 PM  Clinical Narrative:     3:34 PM CSW is awaiting new physical therapy note needed for SNF insurance authorization request.  4:58 PM Renal Navigator inquired CSW about [facility] SNF contracted dialysis clinics/chair times/days in efforts to coordinate outpatient dialysis for patient. CSW relayed inquiry to SNF admissions. SNF informed CSW of their dialysis transportation options [facility] MWF 8AM-11AM chair time). CSW relayed information to Renal Navigator.  Expected Discharge Plan: Skilled Nursing Facility Barriers to Discharge: Continued Medical Work up, English as a second language teacher  Expected Discharge Plan and Services In-house Referral: Clinical Social Work   Post Acute Care Choice: Skilled Nursing Facility Living arrangements for the past 2 months: Single Family Home                                       Social Determinants of Health (SDOH) Interventions SDOH Screenings   Food Insecurity: No Food Insecurity (08/31/2023)  Housing: Low Risk  (08/31/2023)  Transportation Needs: No Transportation Needs (08/31/2023)  Utilities: Not At Risk (08/31/2023)  Alcohol Screen: Low Risk  (07/23/2021)  Depression (PHQ2-9): Low Risk  (01/18/2023)  Financial Resource Strain: Low Risk  (07/23/2021)  Physical Activity: Inactive (07/23/2021)  Social Connections: Moderately Integrated (08/31/2023)  Stress: No Stress Concern Present (07/23/2021)  Tobacco Use: Medium Risk (09/16/2023)    Readmission Risk Interventions     No data to display

## 2023-09-22 ENCOUNTER — Ambulatory Visit: Admitting: Cardiology

## 2023-09-22 ENCOUNTER — Other Ambulatory Visit (HOSPITAL_COMMUNITY): Payer: Self-pay

## 2023-09-22 ENCOUNTER — Encounter (HOSPITAL_COMMUNITY): Payer: Self-pay

## 2023-09-22 DIAGNOSIS — K37 Unspecified appendicitis: Secondary | ICD-10-CM | POA: Diagnosis not present

## 2023-09-22 LAB — RENAL FUNCTION PANEL
Albumin: 2.3 g/dL — ABNORMAL LOW (ref 3.5–5.0)
Anion gap: 14 (ref 5–15)
BUN: 30 mg/dL — ABNORMAL HIGH (ref 8–23)
CO2: 25 mmol/L (ref 22–32)
Calcium: 9 mg/dL (ref 8.9–10.3)
Chloride: 97 mmol/L — ABNORMAL LOW (ref 98–111)
Creatinine, Ser: 4.64 mg/dL — ABNORMAL HIGH (ref 0.61–1.24)
GFR, Estimated: 12 mL/min — ABNORMAL LOW (ref >60)
Glucose, Bld: 94 mg/dL (ref 70–99)
Phosphorus: 5.4 mg/dL — ABNORMAL HIGH (ref 2.5–4.6)
Potassium: 4 mmol/L (ref 3.5–5.1)
Sodium: 136 mmol/L (ref 135–145)

## 2023-09-22 LAB — GLUCOSE, CAPILLARY
Glucose-Capillary: 98 mg/dL (ref 70–99)
Glucose-Capillary: 99 mg/dL (ref 70–99)
Glucose-Capillary: 104 mg/dL — ABNORMAL HIGH (ref 70–99)
Glucose-Capillary: 93 mg/dL (ref 70–99)

## 2023-09-22 NOTE — Progress Notes (Signed)
 Physical Therapy Treatment Patient Details Name: Christian [name] MRN: [id number] DOB: 1946-01-13 Today's Date: 09/22/2023   History of Present Illness 78 y.o. male admitted 08/30/23 with RLQ pain, appendicitis and perforated appendix s/p laparoscopic appendectomy 5/6. Post op ileus. 5/23 Afib. PMHx: CAD s/p CABG, HTN, CKD, DMT2, BPH, multiple sclerosis, neuropathy, mild dementia    PT Comments  Pt resting in bed on arrival and agreeable to session with steady progress towards acute goals. Pt continues to require increased cues to sequence and initiate all mobility, however pt able to follow all simple single step commands. Pt able to come to sitting EOB with min A with step by step cues to complete and assist to manage trunk. Pt demonstrating good sitting balance this session, able to maintain without assist for increased time. Pt able to come to stand with max A to boost with RW for support and take a few steps along EOB to HOB. Patient will benefit from continued inpatient follow up therapy, <3 hours/day, will continue to follow acutely.    If plan is discharge home, recommend the following: A lot of help with walking and/or transfers;Assistance with cooking/housework;Assist for transportation;Help with stairs or ramp for entrance;Supervision due to cognitive status   Can travel by private vehicle     No  Equipment Recommendations  Hospital bed;BSC/3in1;Wheelchair cushion (measurements PT);Wheelchair (measurements PT)    Recommendations for Other Services       Precautions / Restrictions Precautions Precautions: Fall Recall of Precautions/Restrictions: Impaired Precaution/Restrictions Comments: incontinent stool, watch sats Restrictions Weight Bearing Restrictions Per Provider Order: No     Mobility  Bed Mobility Overal bed mobility: Needs Assistance Bed Mobility: Supine to Sit, Sit to Supine     Supine to sit: HOB elevated, Min assist, Used rails Sit to supine: Min assist, Used  rails   General bed mobility comments: max cues for sequencing and initiaion, able to reach across with RUE to L rail and self mobilize LEs to and off EOB, light min A to elevate trunk to sitting and to return LEs to bed    Transfers Overall transfer level: Needs assistance Equipment used: Rolling walker (2 wheels) Transfers: Sit to/from Stand Sit to Stand: Max assist, From elevated surface           General transfer comment: max A to boost to stand with flexed posture, able to take a few side steps to [name] with pt sliding LLE, able to step RLE    Ambulation/Gait               General Gait Details: unable at this time.   Stairs             Wheelchair Mobility     Tilt Bed    Modified Rankin (Stroke Patients Only)       Balance Overall balance assessment: Needs assistance Sitting-balance support: No upper extremity supported, Feet supported Sitting balance-Leahy Scale: Fair Sitting balance - Comments: able to maintain without assist at EOB   Standing balance support: Reliant on assistive device for balance Standing balance-Leahy Scale: Poor Standing balance comment: reliant on UB Support and external assist                            Communication Communication Communication: Impaired Factors Affecting Communication: Difficulty expressing self;Reduced clarity of speech  Cognition Arousal: Alert Behavior During Therapy: Flat affect, Anxious   PT - Cognitive impairments: Problem solving, Initiation, Safety/Judgement, No family/caregiver present  to determine baseline, Awareness Difficult to assess due to: Impaired communication                     PT - Cognition Comments: delayed initiation and command following, fearful and anxious. Requires constant cues for breathing and reassurance Following commands: Impaired Following commands impaired: Follows one step commands inconsistently    Cueing Cueing Techniques: Verbal cues,  Gestural cues, Tactile cues  Exercises General Exercises - Lower Extremity Heel Slides: AROM, Right, Left, 5 reps, Supine    General Comments        Pertinent Vitals/Pain Pain Assessment Pain Assessment: Faces Faces Pain Scale: Hurts little more Pain Location: everything Pain Descriptors / Indicators: Grimacing, Guarding, Sharp, Moaning Pain Intervention(s): Monitored during session, Limited activity within patient's tolerance    Home Living                          Prior Function            PT Goals (current goals can now be found in the care plan section) Acute Rehab PT Goals PT Goal Formulation: With patient/family Time For Goal Achievement: 09/29/23 Progress towards PT goals: Progressing toward goals    Frequency    Min 2X/week      PT Plan      Co-evaluation              AM-PAC PT "6 Clicks" Mobility   Outcome Measure  Help needed turning from your back to your side while in a flat bed without using bedrails?: A Little Help needed moving from lying on your back to sitting on the side of a flat bed without using bedrails?: A Lot Help needed moving to and from a bed to a chair (including a wheelchair)?: A Lot Help needed standing up from a chair using your arms (e.g., wheelchair or bedside chair)?: A Lot Help needed to walk in hospital room?: Total Help needed climbing 3-5 steps with a railing? : Total 6 Click Score: 11    End of Session Equipment Utilized During Treatment: Oxygen Activity Tolerance: Patient tolerated treatment well Patient left: with call bell/phone within reach;in bed;with bed alarm set Nurse Communication: Mobility status PT Visit Diagnosis: Other abnormalities of gait and mobility (R26.89);Muscle weakness (generalized) (M62.81) Pain - part of body:  (abdomen)     Time: 8413-2440 PT Time Calculation (min) (ACUTE ONLY): 25 min  Charges:    $Therapeutic Activity: 23-37 mins PT General Charges $$ ACUTE PT VISIT:  1 Visit                     Christian [name] R. PTA Acute Rehabilitation Services Office: [phone]   Christian [name] 09/22/2023, 9:47 AM

## 2023-09-22 NOTE — Progress Notes (Addendum)
 PROGRESS NOTE    Christian [name]  ZOX:[id number] DOB: 05/10/1945 DOA: 08/30/2023 PCP: [name], MD   Brief Narrative:  78 year old male with history of CAD status post stenting and CABG, postop CVA, chronic kidney disease stage IV, hyperlipidemia, hypertension underwent laparoscopic appendectomy by general surgery on 08/31/2023 followed by postoperative ileus requiring NG tube with hospital course complicated by acute kidney injury.  Nephrology was consulted and patient was started on hemodialysis.  He also developed paroxysmal A-fib with RVR: Cardiology was consulted.  He is currently on amiodarone  and metoprolol .  Subsequently, condition has improved.  Ileus has resolved and patient is tolerating diet.  General surgery and cardiology signed off on 09/21/2023.  PT recommending SNF placement.  Assessment & Plan:   Acute perforated appendicitis with abscess status post surgical intervention on 08/31/2023 followed by postop ileus -Patient completed 2 weeks course of Zosyn .  General surgery was involved and patient had JP drain and NG tube.  Subsequently condition has improved.  Ileus has resolved and patient is tolerating diet.  Off of antibiotics.  General surgery signed off on 09/21/2023: Outpatient follow-up with general surgery  AKI on CKD stage IV with concerns for ATN/possible new ESRD - Nephrology following.  Dialysis as per nephrology schedule and recommendations.  Will need outpatient hemodialysis unit arranged prior to discharge - Will need outpatient follow-up with vascular surgery regarding AV fistula  Acute blood loss anemia -Required 1 unit packed red cell transfusion during this hospitalization.  Hemoglobin stable subsequently.  No recent labs.  Monitor  Leukocytosis No recent labs.  Monitor  Paroxysmal A-fib with RVR - Currently has mild intermittent tachycardia.  Continue oral amiodarone  and metoprolol .  Outpatient follow-up with cardiology.  Cardiology signed off on  09/21/2023  Essential hypertension - Blood pressure stable.  Continue oral metoprolol   Diabetes mellitus type 2 - Continue CBGs with SSI  CAD with history of stenting and CABG - Stable.  Outpatient follow-up with cardiologist  Hypokalemia - Resolved.   DVT prophylaxis: Heparin  subcutaneous Code Status: Full Family Communication: None at bedside Disposition Plan: Status is: Inpatient Remains inpatient appropriate because: Of severity of illness.  Need for SNF placement.    Consultants: General Surgery/PCCM/nephrology/vascular surgery/cardiology/ID  Procedures: As above  Antimicrobials:  Anti-infectives (From admission, onward)    Start     Dose/Rate Route Frequency Ordered Stop   09/16/23 1300  ceFAZolin  (ANCEF ) IVPB 2g/100 mL premix        2 g 200 mL/hr over 30 Minutes Intravenous  Once 09/16/23 1255 09/16/23 1347   09/13/23 2000  ampicillin -sulbactam (UNASYN ) 1.5 g in sodium chloride  0.9 % 100 mL IVPB  Status:  Discontinued        1.5 g 200 mL/hr over 30 Minutes Intravenous Every 12 hours 09/13/23 1817 09/15/23 0943   08/31/23 2200  piperacillin -tazobactam (ZOSYN ) IVPB 2.25 g  Status:  Discontinued        2.25 g 100 mL/hr over 30 Minutes Intravenous Every 8 hours 08/31/23 1953 09/13/23 0858   08/31/23 0600  piperacillin -tazobactam (ZOSYN ) IVPB 3.375 g  Status:  Discontinued        3.375 g 100 mL/hr over 30 Minutes Intravenous Every 8 hours 08/31/23 0532 08/31/23 1953   08/30/23 2245  piperacillin -tazobactam (ZOSYN ) IVPB 3.375 g        3.375 g 100 mL/hr over 30 Minutes Intravenous  Once 08/30/23 2233 08/31/23 0005        Subjective: Patient seen and examined at bedside.  Complains of continued intermittently  hurting all over.  No fever, vomiting, chest pain reported.  Objective: Vitals:   09/22/23 0753 09/22/23 1044 09/22/23 1053 09/22/23 1130  BP:   118/63 121/81  Pulse: 99 (!) 101 (!) 104 (!) 102  Resp: 18 12  19   Temp:    97.7 F (36.5 C)  TempSrc:     Oral  SpO2: 97% 97%  100%  Weight:      Height:        Intake/Output Summary (Last 24 hours) at 09/22/2023 1142 Last data filed at 09/22/2023 1044 Gross per 24 hour  Intake 100 ml  Output 2250 ml  Net -2150 ml   Filed Weights   09/21/23 0827 09/21/23 1229 09/22/23 0355  Weight: 87.9 kg 85.7 kg 82.9 kg    Examination:  General exam: Appears calm and comfortable.  On 2 L oxygen via nasal cannula. Respiratory system: Bilateral decreased breath sounds at bases with scattered crackles Cardiovascular system: S1 & S2 heard, intubated cardiac  gastrointestinal system: Abdomen is nondistended, soft and nontender. Normal bowel sounds heard. Extremities: No cyanosis, clubbing, edema  Central nervous system: Alert and oriented.  Slow to respond.  Poor historian.  No focal neurological deficits. Moving extremities Skin: No rashes, lesions or ulcers Psychiatry: Flat affect.  Not agitated.    Data Reviewed: I have personally reviewed following labs and imaging studies  CBC: Recent Labs  Lab 09/16/23 0435 09/16/23 2249 09/17/23 0141 09/18/23 0235  WBC 11.8* 13.4*  --  13.2*  HGB 8.5* 8.6* 7.8* 8.0*  HCT 26.9* 27.2* 23.0* 25.2*  MCV 102.3* 103.4*  --  101.2*  PLT 322 317  --  277   Basic Metabolic Panel: Recent Labs  Lab 09/16/23 0435 09/16/23 1744 09/16/23 2249 09/17/23 0141 09/17/23 0232 09/18/23 0235 09/19/23 0225 09/20/23 0758 09/21/23 0219 09/22/23 0309  NA 142   < > 142   < > 143 136 137 135 136 136  K 3.9   < > 4.5   < > 4.5 3.4* 3.7 4.1 3.9 4.0  CL 104   < > 108  --  104 94* 95* 95* 95* 97*  CO2 23   < > 23  --  22 25 24 23 25 25   GLUCOSE 167*   < > 180*  --  202* 229* 123* 160* 119* 94  BUN 83*   < > 93*  --  92* 51* 30* 48* 55* 30*  CREATININE 4.04*   < > 4.28*  --  4.18* 3.44* 3.07* 5.53* 6.64* 4.64*  CALCIUM  9.1   < > 9.1  --  9.3 8.8* 9.1 8.9 9.0 9.0  MG 1.7  --  2.2  --  2.3  --   --  1.9  --   --   PHOS 3.5   < >  --   --  4.3 4.9* 4.6 6.6* 6.8* 5.4*    < > = values in this interval not displayed.   GFR: Estimated Creatinine Clearance: 13.5 mL/min (A) (by C-G formula based on SCr of 4.64 mg/dL (H)). Liver Function Tests: Recent Labs  Lab 09/16/23 0435 09/16/23 1744 09/17/23 0232 09/18/23 0235 09/19/23 0225 09/20/23 0758 09/21/23 0219 09/22/23 0309  AST 102*  --  70* 47*  --  30  --   --   ALT 105*  --  73* 39  --  21  --   --   ALKPHOS 97  --  108 99  --  107  --   --  BILITOT 0.8  --  0.6 0.7  --  0.9  --   --   PROT 6.3*  --  7.2 6.7  --  7.0  --   --   ALBUMIN  1.8*   < > 2.4* 2.4*  2.4* 2.6* 2.4* 2.3* 2.3*   < > = values in this interval not displayed.   No results for input(s): "LIPASE", "AMYLASE" in the last 168 hours. No results for input(s): "AMMONIA" in the last 168 hours. Coagulation Profile: No results for input(s): "INR", "PROTIME" in the last 168 hours. Cardiac Enzymes: No results for input(s): "CKTOTAL", "CKMB", "CKMBINDEX", "TROPONINI" in the last 168 hours. BNP (last 3 results) No results for input(s): "PROBNP" in the last 8760 hours. HbA1C: No results for input(s): "HGBA1C" in the last 72 hours. CBG: Recent Labs  Lab 09/21/23 1300 09/21/23 1525 09/21/23 2104 09/22/23 0622 09/22/23 1132  GLUCAP 120* 121* 93 98 99   Lipid Profile: Recent Labs    09/20/23 0758  TRIG 196*   Thyroid  Function Tests: No results for input(s): "TSH", "T4TOTAL", "FREET4", "T3FREE", "THYROIDAB" in the last 72 hours. Anemia Panel: No results for input(s): "VITAMINB12", "FOLATE", "FERRITIN", "TIBC", "IRON", "RETICCTPCT" in the last 72 hours. Sepsis Labs: No results for input(s): "PROCALCITON", "LATICACIDVEN" in the last 168 hours.  Recent Results (from the past 240 hours)  Culture, blood (Routine X 2) w Reflex to ID Panel     Status: None   Collection Time: 09/13/23 10:11 AM   Specimen: BLOOD RIGHT HAND  Result Value Ref Range Status   Specimen Description BLOOD RIGHT HAND  Final   Special Requests   Final     BOTTLES DRAWN AEROBIC AND ANAEROBIC Blood Culture adequate volume   Culture   Final    NO GROWTH 5 DAYS Performed at [facility], [address], [place], [place] [address]    Report Status 09/18/2023 FINAL  Final  Culture, blood (Routine X 2) w Reflex to ID Panel     Status: None   Collection Time: 09/13/23 10:11 AM   Specimen: BLOOD RIGHT HAND  Result Value Ref Range Status   Specimen Description BLOOD RIGHT HAND  Final   Special Requests   Final    BOTTLES DRAWN AEROBIC AND ANAEROBIC Blood Culture adequate volume   Culture   Final    NO GROWTH 5 DAYS Performed at [facility], [address], [place], [place] [address]    Report Status 09/18/2023 FINAL  Final         Radiology Studies: No results found.      Scheduled Meds:  amiodarone   200 mg Oral BID   calcium  acetate  667 mg Oral TID WC   Chlorhexidine  Gluconate Cloth  6 each Topical Q0600   darbepoetin (ARANESP ) injection - DIALYSIS  100 mcg Subcutaneous Q Thu-1800   feeding supplement  1 Container Oral TID BM   fluticasone  furoate-vilanterol  1 puff Inhalation Daily   heparin  injection (subcutaneous)  5,000 Units Subcutaneous Q8H   insulin  aspart  0-20 Units Subcutaneous TID WC   metoprolol  tartrate  50 mg Oral BID   sodium chloride  flush  3 mL Intravenous Q12H   Continuous Infusions:        Audria Leather, MD [facility] 09/22/2023, 11:42 AM

## 2023-09-22 NOTE — Progress Notes (Signed)
 [facility] Progress Note   Assessment/ Plan:    Expand All Collapse All  [facility] NEPHROLOGY PROGRESS NOTE   Assessment/ Plan: Pt is a 78 y.o. yo male with CAD status post CABG, underwent laparoscopic appendectomy on 5/7 developed postop ileus, AKI on CKD.   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  UA with pyuria and kidney ultrasound without hydronephrosis. No sign of renal recovery with worsening azotemia. First HD on 5/14, second HD on 5/15.  Received third HD 5/17 with 1.5 L UF, tolerated well.  Continues on TTS schedule.  Ended up getting into trouble fluid wise with delayed HD 5/22- got in middle of the night urgently-  then routine HD on 5/24-  TTS schedule while here CLIP in process- needs to come in HD chair tomorrow.   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  On 5/14, the AV fistula is unable to cannulate even with small needle 17G.   TDC  with [facility], 5/22. [facility] to follow as an outpatient regarding his AV fistula   # Appendicitis with possible abscess and postop ileus: s/p  antibiotics and drain per surgical team.     # Acute blood loss anemia, CT angio negative for bleeding.  Transfuse as needed.  Started ESA 5/22.  No IV iron until infectious issues clearly resolved.     # CKD-MBD, hyperphosphatemia: Phosphorus stable and with poor p.o. is off sevelamer , PTH level 337, continue to monitor.   # Hypokalemia: Managed with HD, monitor lab.    Subjective:    Seen in room.  Discussed coming in HD chair for tomorrow's treatment.     Objective:   BP 118/63   Pulse (!) 104   Temp 98.1 F (36.7 C) (Oral)   Resp 12   Ht 5\' 9"  (1.753 m)   Wt 82.9 kg   SpO2 97%   BMI 26.99 kg/m   Physical  Exam: Gen:NAD CVS:RRR Resp:clear, muffled at bases Abd: soft. Slightly distended ZOX:WRUEA LE edema ACCESS: TDC and L AVF + T/B but has infiltration  Labs: BMET Recent Labs  Lab 09/16/23 1744 09/16/23 2249 09/17/23 0141 09/17/23 0232 09/18/23 0235 09/19/23 0225 09/20/23 0758 09/21/23 0219 09/22/23 0309  NA 144 142 143 143 136 137 135 136 136  K 4.6 4.5 4.7 4.5 3.4* 3.7 4.1 3.9 4.0  CL 107 108  --  104 94* 95* 95* 95* 97*  CO2 23 23  --  22 25 24 23 25 25   GLUCOSE 171* 180*  --  202* 229* 123* 160* 119* 94  BUN 90* 93*  --  92* 51* 30* 48* 55* 30*  CREATININE 4.30* 4.28*  --  4.18* 3.44* 3.07* 5.53* 6.64* 4.64*  CALCIUM  9.0 9.1  --  9.3 8.8* 9.1 8.9 9.0 9.0  PHOS 4.7*  --   --  4.3 4.9* 4.6 6.6* 6.8* 5.4*   CBC Recent Labs  Lab 09/16/23 0435 09/16/23 2249 09/17/23 0141 09/18/23 0235  WBC 11.8* 13.4*  --  13.2*  HGB 8.5* 8.6* 7.8* 8.0*  HCT 26.9* 27.2* 23.0* 25.2*  MCV 102.3* 103.4*  --  101.2*  PLT 322 317  --  277      Medications:     amiodarone   200 mg Oral BID  calcium  acetate  667 mg Oral TID WC   Chlorhexidine  Gluconate Cloth  6 each Topical Q0600   darbepoetin (ARANESP ) injection - DIALYSIS  100 mcg Subcutaneous Q Thu-1800   feeding supplement  1 Container Oral TID BM   fluticasone  furoate-vilanterol  1 puff Inhalation Daily   heparin  injection (subcutaneous)  5,000 Units Subcutaneous Q8H   insulin  aspart  0-20 Units Subcutaneous TID WC   metoprolol  tartrate  50 mg Oral BID   sodium chloride  flush  3 mL Intravenous Q12H     Leandra Pro MD 09/22/2023, 11:20 AM

## 2023-09-22 NOTE — Plan of Care (Signed)
  Problem: Education: Goal: Ability to describe self-care measures that may prevent or decrease complications (Diabetes Survival Skills Education) will improve Outcome: Progressing Goal: Individualized Educational Video(s) Outcome: Progressing   Problem: Coping: Goal: Ability to adjust to condition or change in health will improve Outcome: Progressing   Problem: Fluid Volume: Goal: Ability to maintain a balanced intake and output will improve Outcome: Progressing   Problem: Health Behavior/Discharge Planning: Goal: Ability to identify and utilize available resources and services will improve Outcome: Progressing Goal: Ability to manage health-related needs will improve Outcome: Progressing   Problem: Metabolic: Goal: Ability to maintain appropriate glucose levels will improve Outcome: Progressing   Problem: Nutritional: Goal: Maintenance of adequate nutrition will improve Outcome: Progressing Goal: Progress toward achieving an optimal weight will improve Outcome: Progressing   Problem: Skin Integrity: Goal: Risk for impaired skin integrity will decrease Outcome: Progressing   Problem: Tissue Perfusion: Goal: Adequacy of tissue perfusion will improve Outcome: Progressing   Problem: Education: Goal: Knowledge of General Education information will improve Description: Including pain rating scale, medication(s)/side effects and non-pharmacologic comfort measures Outcome: Progressing   Problem: Health Behavior/Discharge Planning: Goal: Ability to manage health-related needs will improve Outcome: Progressing   Problem: Clinical Measurements: Goal: Ability to maintain clinical measurements within normal limits will improve Outcome: Progressing Goal: Will remain free from infection Outcome: Progressing Goal: Diagnostic test results will improve Outcome: Progressing Goal: Respiratory complications will improve Outcome: Progressing Goal: Cardiovascular complication will  be avoided Outcome: Progressing   Problem: Activity: Goal: Risk for activity intolerance will decrease Outcome: Progressing   Problem: Nutrition: Goal: Adequate nutrition will be maintained Outcome: Progressing   Problem: Coping: Goal: Level of anxiety will decrease Outcome: Progressing   Problem: Elimination: Goal: Will not experience complications related to bowel motility Outcome: Progressing Goal: Will not experience complications related to urinary retention Outcome: Progressing   Problem: Pain Managment: Goal: General experience of comfort will improve and/or be controlled Outcome: Progressing   Problem: Safety: Goal: Ability to remain free from injury will improve Outcome: Progressing   Problem: Skin Integrity: Goal: Risk for impaired skin integrity will decrease Outcome: Progressing   Problem: Education: Goal: Knowledge of disease and its progression will improve Outcome: Progressing Goal: Individualized Educational Video(s) Outcome: Progressing   Problem: Fluid Volume: Goal: Compliance with measures to maintain balanced fluid volume will improve Outcome: Progressing   Problem: Health Behavior/Discharge Planning: Goal: Ability to manage health-related needs will improve Outcome: Progressing   Problem: Nutritional: Goal: Ability to make healthy dietary choices will improve Outcome: Progressing   Problem: Clinical Measurements: Goal: Complications related to the disease process, condition or treatment will be avoided or minimized Outcome: Progressing   Problem: Education: Goal: Understanding of CV disease, CV risk reduction, and recovery process will improve Outcome: Progressing Goal: Individualized Educational Video(s) Outcome: Progressing   Problem: Activity: Goal: Ability to return to baseline activity level will improve Outcome: Progressing   Problem: Cardiovascular: Goal: Ability to achieve and maintain adequate cardiovascular perfusion  will improve Outcome: Progressing   Problem: Health Behavior/Discharge Planning: Goal: Ability to safely manage health-related needs after discharge will improve Outcome: Progressing

## 2023-09-22 NOTE — Progress Notes (Signed)
 Pt in need of out-pt HD at d/c. Per CSW and team yesterday, pt nearing d/c. Plans are for pt to go to [facility] snf at d/c. SNF is requesting a MWF 2nd shift appt at any [facility]. Referral submitted to [facility] admissions this morning with request for MWF 2nd shift [facility]. Will await determination.   Lauraine Polite Renal Navigator [phone]

## 2023-09-22 NOTE — TOC Progression Note (Signed)
 Transition of Care [facility]) - Progression Note    Patient Details  Name: Christian [name] MRN: [id number] Date of Birth: [birth year]  Transition of Care [facility]) CM/SW Contact  Katrinka Parr, [place] Phone Number: 09/22/2023, 2:28 PM  Clinical Narrative:     Siegfried Dress approved for [facility] 5/28 - 6/3 [id number]   Renal navigator seeking OPHD chair time that [facility] can accommodate.   TOC will continue to follow.   Expected Discharge Plan: Skilled Nursing Facility Barriers to Discharge: Continued Medical Work up, English as a second language teacher  Expected Discharge Plan and Services In-house Referral: Clinical Social Work   Post Acute Care Choice: Skilled Nursing Facility Living arrangements for the past 2 months: Single Family Home                                       Social Determinants of Health (SDOH) Interventions SDOH Screenings   Food Insecurity: No Food Insecurity (08/31/2023)  Housing: Low Risk  (08/31/2023)  Transportation Needs: No Transportation Needs (08/31/2023)  Utilities: Not At Risk (08/31/2023)  Alcohol Screen: Low Risk  (07/23/2021)  Depression (PHQ2-9): Low Risk  (01/18/2023)  Financial Resource Strain: Low Risk  (07/23/2021)  Physical Activity: Inactive (07/23/2021)  Social Connections: Moderately Integrated (08/31/2023)  Stress: No Stress Concern Present (07/23/2021)  Tobacco Use: Medium Risk (09/16/2023)    Readmission Risk Interventions     No data to display

## 2023-09-23 ENCOUNTER — Inpatient Hospital Stay (HOSPITAL_COMMUNITY)

## 2023-09-23 DIAGNOSIS — K37 Unspecified appendicitis: Secondary | ICD-10-CM | POA: Diagnosis not present

## 2023-09-23 LAB — CBC WITH DIFFERENTIAL/PLATELET
Abs Immature Granulocytes: 0.86 10*3/uL — ABNORMAL HIGH (ref 0.00–0.07)
Basophils Absolute: 0.1 10*3/uL (ref 0.0–0.1)
Basophils Relative: 1 %
Eosinophils Absolute: 0.1 10*3/uL (ref 0.0–0.5)
Eosinophils Relative: 1 %
HCT: 25.5 % — ABNORMAL LOW (ref 39.0–52.0)
Hemoglobin: 8 g/dL — ABNORMAL LOW (ref 13.0–17.0)
Immature Granulocytes: 6 %
Lymphocytes Relative: 13 %
Lymphs Abs: 1.9 10*3/uL (ref 0.7–4.0)
MCH: 31.9 pg (ref 26.0–34.0)
MCHC: 31.4 g/dL (ref 30.0–36.0)
MCV: 101.6 fL — ABNORMAL HIGH (ref 80.0–100.0)
Monocytes Absolute: 1.5 10*3/uL — ABNORMAL HIGH (ref 0.1–1.0)
Monocytes Relative: 10 %
Neutro Abs: 10.2 10*3/uL — ABNORMAL HIGH (ref 1.7–7.7)
Neutrophils Relative %: 69 %
Platelets: 410 10*3/uL — ABNORMAL HIGH (ref 150–400)
RBC: 2.51 MIL/uL — ABNORMAL LOW (ref 4.22–5.81)
RDW: 16.4 % — ABNORMAL HIGH (ref 11.5–15.5)
Smear Review: NORMAL
WBC: 14.6 10*3/uL — ABNORMAL HIGH (ref 4.0–10.5)
nRBC: 0 % (ref 0.0–0.2)

## 2023-09-23 LAB — URINALYSIS, ROUTINE W REFLEX MICROSCOPIC
Bilirubin Urine: NEGATIVE
Glucose, UA: NEGATIVE mg/dL
Ketones, ur: NEGATIVE mg/dL
Nitrite: NEGATIVE
Protein, ur: 100 mg/dL — AB
Specific Gravity, Urine: 1.025 (ref 1.005–1.030)
pH: 5 (ref 5.0–8.0)

## 2023-09-23 LAB — RENAL FUNCTION PANEL
Albumin: 2.5 g/dL — ABNORMAL LOW (ref 3.5–5.0)
Anion gap: 22 — ABNORMAL HIGH (ref 5–15)
BUN: 22 mg/dL (ref 8–23)
CO2: 20 mmol/L — ABNORMAL LOW (ref 22–32)
Calcium: 9.2 mg/dL (ref 8.9–10.3)
Chloride: 92 mmol/L — ABNORMAL LOW (ref 98–111)
Creatinine, Ser: 3.51 mg/dL — ABNORMAL HIGH (ref 0.61–1.24)
GFR, Estimated: 17 mL/min — ABNORMAL LOW (ref >60)
Glucose, Bld: 86 mg/dL (ref 70–99)
Phosphorus: 4.6 mg/dL (ref 2.5–4.6)
Potassium: 4.4 mmol/L (ref 3.5–5.1)
Sodium: 134 mmol/L — ABNORMAL LOW (ref 135–145)

## 2023-09-23 LAB — COMPREHENSIVE METABOLIC PANEL WITH GFR
ALT: 18 U/L (ref 0–44)
AST: 29 U/L (ref 15–41)
Albumin: 2.3 g/dL — ABNORMAL LOW (ref 3.5–5.0)
Alkaline Phosphatase: 101 U/L (ref 38–126)
Anion gap: 19 — ABNORMAL HIGH (ref 5–15)
BUN: 50 mg/dL — ABNORMAL HIGH (ref 8–23)
CO2: 23 mmol/L (ref 22–32)
Calcium: 9.3 mg/dL (ref 8.9–10.3)
Chloride: 94 mmol/L — ABNORMAL LOW (ref 98–111)
Creatinine, Ser: 6.59 mg/dL — ABNORMAL HIGH (ref 0.61–1.24)
GFR, Estimated: 8 mL/min — ABNORMAL LOW (ref >60)
Glucose, Bld: 88 mg/dL (ref 70–99)
Potassium: 4.1 mmol/L (ref 3.5–5.1)
Sodium: 136 mmol/L (ref 135–145)
Total Bilirubin: 0.6 mg/dL (ref 0.0–1.2)
Total Protein: 8 g/dL (ref 6.5–8.1)

## 2023-09-23 LAB — GLUCOSE, CAPILLARY
Glucose-Capillary: 115 mg/dL — ABNORMAL HIGH (ref 70–99)
Glucose-Capillary: 110 mg/dL — ABNORMAL HIGH (ref 70–99)
Glucose-Capillary: 88 mg/dL (ref 70–99)
Glucose-Capillary: 86 mg/dL (ref 70–99)
Glucose-Capillary: 114 mg/dL — ABNORMAL HIGH (ref 70–99)

## 2023-09-23 LAB — PHOSPHORUS: Phosphorus: 7 mg/dL — ABNORMAL HIGH (ref 2.5–4.6)

## 2023-09-23 LAB — MAGNESIUM: Magnesium: 2 mg/dL (ref 1.7–2.4)

## 2023-09-23 MED ORDER — SODIUM CHLORIDE 0.9 % IV BOLUS
250.0000 mL | Freq: Once | INTRAVENOUS | Status: AC
  Administered 2023-09-23: 250 mL via INTRAVENOUS

## 2023-09-23 MED ORDER — HEPARIN SODIUM (PORCINE) 1000 UNIT/ML IJ SOLN
INTRAMUSCULAR | Status: AC
  Filled 2023-09-23: qty 4

## 2023-09-23 MED ORDER — RENA-VITE PO TABS
1.0000 | ORAL_TABLET | Freq: Every day | ORAL | Status: DC
  Administered 2023-09-23 – 2023-10-01 (×9): 1 via ORAL
  Filled 2023-09-23 (×9): qty 1

## 2023-09-23 MED ORDER — HEPARIN SODIUM (PORCINE) 1000 UNIT/ML IJ SOLN
4000.0000 [IU] | Freq: Once | INTRAMUSCULAR | Status: AC
  Administered 2023-09-23: 3800 [IU]

## 2023-09-23 MED ORDER — OXYCODONE-ACETAMINOPHEN 7.5-325 MG PO TABS
1.0000 | ORAL_TABLET | Freq: Four times a day (QID) | ORAL | Status: DC | PRN
  Administered 2023-09-24 – 2023-10-01 (×6): 1 via ORAL
  Filled 2023-09-23 (×8): qty 1

## 2023-09-23 MED ORDER — MIDODRINE HCL 5 MG PO TABS
10.0000 mg | ORAL_TABLET | Freq: Three times a day (TID) | ORAL | Status: DC
  Administered 2023-09-23 – 2023-09-28 (×12): 10 mg via ORAL
  Filled 2023-09-23 (×11): qty 2

## 2023-09-23 MED ORDER — THIAMINE MONONITRATE 100 MG PO TABS
100.0000 mg | ORAL_TABLET | Freq: Every day | ORAL | Status: DC
  Administered 2023-09-23 – 2023-10-01 (×7): 100 mg via ORAL
  Filled 2023-09-23 (×7): qty 1

## 2023-09-23 MED ORDER — MIDODRINE HCL 5 MG PO TABS: 10.0000 mg | ORAL_TABLET | Freq: Three times a day (TID) | ORAL | Status: DC

## 2023-09-23 MED ORDER — ENSURE PLUS HIGH PROTEIN PO LIQD
237.0000 mL | Freq: Two times a day (BID) | ORAL | Status: DC
  Administered 2023-09-28 – 2023-09-30 (×4): 237 mL via ORAL

## 2023-09-23 MED ORDER — MIDODRINE HCL 5 MG PO TABS
10.0000 mg | ORAL_TABLET | Freq: Once | ORAL | Status: AC
  Administered 2023-09-25: 10 mg via ORAL
  Filled 2023-09-23: qty 2

## 2023-09-23 MED ORDER — ADULT MULTIVITAMIN W/MINERALS CH: 1.0000 | ORAL_TABLET | Freq: Every day | ORAL | Status: DC

## 2023-09-23 NOTE — Progress Notes (Addendum)
 Nutrition Follow-up  DOCUMENTATION CODES:   Not applicable  INTERVENTION:   Encourage PO intake  House trays per wife requests  Assist with feeding pt Ensure Enlive po BID, each supplement provides 350 kcal and 20 grams of protein. Magic cup TID with meals, each supplement provides 290 kcal and 9 grams of protein MVI daily 100 mg Thiamine  daily  D/C Boost Breeze   NUTRITION DIAGNOSIS:   Inadequate oral intake related to inability to eat as evidenced by NPO status.  - Progressing, now on regular diet  GOAL:   Patient will meet greater than or equal to 90% of their needs  - Progressing   MONITOR:   Skin, I & O's, Labs, Weight trends  REASON FOR ASSESSMENT:   Consult Diet education, Enteral/tube feeding initiation and management (change TF to Nepro)  ASSESSMENT:   78 y.o. male presented to the ED with RLQ abdominal pain x 3 days. PMH includes CHF, CAD s/p CABG, HTN, HLD,T2DM, dementia, G-tube and CKD IV. Pt admitted with appendicitis with possible abscess.  Pt just received back from HD on visit. Pt was confused, wife at bedside. She reports pt has not been eating only had a handful of fruit yesterday.  Family understandably upset over pt's poor PO intake but stated pt would not want any feeding tubes. RD encouraged family bring pt's favorite foods from home. Pt not interested in breakfast or lunch on visit. Has been refusing Meds and ONS. Will transition to Ensure High Protein for increased calories and protein.  Admit weight: 101.6 - ? Accuracy  Current weight: 82.9 Kg    Average Meal Intake: 5/20: 50% x 1 meal 5/25: 30% x 1 meal 5/27: 0% x 1 meal 5/28: 25% x 1 meal 5/29: 0% x 1 meal   Nutritionally Relevant Medications: Scheduled Meds:  amiodarone   200 mg Oral BID   calcium  acetate  667 mg Oral TID WC   Chlorhexidine  Gluconate Cloth  6 each Topical Q0600   darbepoetin (ARANESP ) injection - DIALYSIS  100 mcg Subcutaneous Q Thu-1800   [START ON 09/24/2023]  feeding supplement  237 mL Oral BID BM   fluticasone  furoate-vilanterol  1 puff Inhalation Daily   heparin  injection (subcutaneous)  5,000 Units Subcutaneous Q8H   insulin  aspart  0-20 Units Subcutaneous TID WC   metoprolol  tartrate  50 mg Oral BID   midodrine   10 mg Oral Once   midodrine   10 mg Oral TID WC   multivitamin with minerals  1 tablet Oral Daily   sodium chloride  flush  3 mL Intravenous Q12H   thiamine   100 mg Oral Daily    Labs Reviewed: Sodium 134 Creatinine 3.51 Albumin  2.5 GFR 18.36 CBG ranges from 88-115 mg/dL over the last 24 hours HgbA1c 7.3  Diet Order:   Diet Order             Diet regular Room service appropriate? Yes; Fluid consistency: Thin  Diet effective now                   EDUCATION NEEDS:   No education needs have been identified at this time  Skin:  Skin Assessment: Reviewed RN Assessment  Last BM:  09/18/23 type 6  Height:   Ht Readings from Last 1 Encounters:  09/16/23 5\' 9"  (1.753 m)    Weight:   Wt Readings from Last 1 Encounters:  09/23/23 82.9 kg    Ideal Body Weight:  72.7 kg  BMI:  Body mass index is 26.99  kg/m.  Estimated Nutritional Needs:   Kcal:  1610-9604  Protein:  115-135 grams  Fluid:  UOP + 1L   Frederik Jansky, RD Registered Dietitian  See Amion for more information

## 2023-09-23 NOTE — Progress Notes (Signed)
 Patient left the unit for dialysis

## 2023-09-23 NOTE — Progress Notes (Signed)
 PROGRESS NOTE    Christian [name]  ZOX:[id number] DOB: 02-21-1946 DOA: 08/30/2023 PCP: [name], MD   Brief Narrative:  78 year old male with history of CAD status post stenting and CABG, postop CVA, chronic kidney disease stage IV, hyperlipidemia, hypertension underwent laparoscopic appendectomy by general surgery on 08/31/2023 followed by postoperative ileus requiring NG tube with hospital course complicated by acute kidney injury.  Nephrology was consulted and patient was started on hemodialysis.  He also developed paroxysmal A-fib with RVR: Cardiology was consulted.  He is currently on amiodarone  and metoprolol .  Subsequently, condition has improved.  Ileus has resolved and patient is tolerating diet.  General surgery and cardiology signed off on 09/21/2023.  PT recommending SNF placement.  Assessment & Plan:   Acute perforated appendicitis with abscess status post surgical intervention on 08/31/2023 followed by postop ileus -Patient completed 2 weeks course of Zosyn .  General surgery was involved and patient had JP drain and NG tube.  Subsequently condition has improved.  Ileus has resolved and patient is tolerating diet.  Off of antibiotics.  General surgery signed off on 09/21/2023: Outpatient follow-up with general surgery  AKI on CKD stage IV with concerns for ATN/possible new ESRD - Nephrology following.  Dialysis as per nephrology schedule and recommendations.  Will need outpatient hemodialysis unit arranged prior to discharge - Will need outpatient follow-up with vascular surgery regarding AV fistula  Acute blood loss anemia -Required 1 unit packed red cell transfusion during this hospitalization.  Hemoglobin stable currently.  Monitor  Leukocytosis -Monitor  Paroxysmal A-fib with RVR - Currently has mild intermittent tachycardia.  Continue oral amiodarone  and metoprolol .  Outpatient follow-up with cardiology.  Cardiology signed off on 09/21/2023  Essential hypertension -  Blood pressure stable.  Continue oral metoprolol   Diabetes mellitus type 2 - Continue CBGs with SSI  CAD with history of stenting and CABG - Stable.  Outpatient follow-up with cardiologist  Hypokalemia - Resolved.   DVT prophylaxis: Heparin  subcutaneous Code Status: Full Family Communication: None at bedside Disposition Plan: Status is: Inpatient Remains inpatient appropriate because: Of severity of illness.  Need for SNF placement.  Currently medically stable for discharge to SNF once outpatient hemodialysis unit has been arranged.    Consultants: General Surgery/PCCM/nephrology/vascular surgery/cardiology/ID  Procedures: As above  Antimicrobials:  Anti-infectives (From admission, onward)    Start     Dose/Rate Route Frequency Ordered Stop   09/16/23 1300  ceFAZolin  (ANCEF ) IVPB 2g/100 mL premix        2 g 200 mL/hr over 30 Minutes Intravenous  Once 09/16/23 1255 09/16/23 1347   09/13/23 2000  ampicillin -sulbactam (UNASYN ) 1.5 g in sodium chloride  0.9 % 100 mL IVPB  Status:  Discontinued        1.5 g 200 mL/hr over 30 Minutes Intravenous Every 12 hours 09/13/23 1817 09/15/23 0943   08/31/23 2200  piperacillin -tazobactam (ZOSYN ) IVPB 2.25 g  Status:  Discontinued        2.25 g 100 mL/hr over 30 Minutes Intravenous Every 8 hours 08/31/23 1953 09/13/23 0858   08/31/23 0600  piperacillin -tazobactam (ZOSYN ) IVPB 3.375 g  Status:  Discontinued        3.375 g 100 mL/hr over 30 Minutes Intravenous Every 8 hours 08/31/23 0532 08/31/23 1953   08/30/23 2245  piperacillin -tazobactam (ZOSYN ) IVPB 3.375 g        3.375 g 100 mL/hr over 30 Minutes Intravenous  Once 08/30/23 2233 08/31/23 0005        Subjective: Patient seen and examined  at bedside this undergoing hemodialysis.  No chest pain, fever, vomiting reported. Objective: Vitals:   09/23/23 0615 09/23/23 0633 09/23/23 0734 09/23/23 0800  BP: 115/71 101/71 116/71 105/70  Pulse: (!) 112 (!) 111 (!) 119 99  Resp: (!) 22  (!) 30 (!) 23 (!) 34  Temp:      TempSrc:      SpO2:  100% 100%   Weight:      Height:        Intake/Output Summary (Last 24 hours) at 09/23/2023 0830 Last data filed at 09/22/2023 1044 Gross per 24 hour  Intake --  Output 50 ml  Net -50 ml   Filed Weights   09/21/23 1229 09/22/23 0355 09/23/23 0415  Weight: 85.7 kg 82.9 kg 82.9 kg    Examination:  General: On 3 L oxygen via nasal cannula.  No distress. ENT/neck: No thyromegaly.  JVD is not elevated  respiratory: Decreased breath sounds at bases bilaterally with some crackles; no wheezing.  Intermittently tachypneic CVS: S1-S2 heard, tachycardic currently  abdominal: Soft, nontender, slightly distended; no organomegaly, bowel sounds are heard Extremities: Trace lower extremity edema; no cyanosis  CNS: Awake and alert.  Remains slow to respond and a poor historian.  No focal neurologic deficit.  Moves extremities Lymph: No obvious lymphadenopathy Skin: No obvious ecchymosis/lesions  psych: Mostly flat affect.  Not agitated currently. musculoskeletal: No obvious joint swelling/deformity     Data Reviewed: I have personally reviewed following labs and imaging studies  CBC: Recent Labs  Lab 09/16/23 2249 09/17/23 0141 09/18/23 0235 09/23/23 0415  WBC 13.4*  --  13.2* 14.6*  NEUTROABS  --   --   --  10.2*  HGB 8.6* 7.8* 8.0* 8.0*  HCT 27.2* 23.0* 25.2* 25.5*  MCV 103.4*  --  101.2* 101.6*  PLT 317  --  277 410*   Basic Metabolic Panel: Recent Labs  Lab 09/16/23 2249 09/17/23 0141 09/17/23 0232 09/18/23 0235 09/19/23 0225 09/20/23 0758 09/21/23 0219 09/22/23 0309 09/23/23 0415  NA 142   < > 143   < > 137 135 136 136 136  K 4.5   < > 4.5   < > 3.7 4.1 3.9 4.0 4.1  CL 108  --  104   < > 95* 95* 95* 97* 94*  CO2 23  --  22   < > 24 23 25 25 23   GLUCOSE 180*  --  202*   < > 123* 160* 119* 94 88  BUN 93*  --  92*   < > 30* 48* 55* 30* 50*  CREATININE 4.28*  --  4.18*   < > 3.07* 5.53* 6.64* 4.64* 6.59*   CALCIUM  9.1  --  9.3   < > 9.1 8.9 9.0 9.0 9.3  MG 2.2  --  2.3  --   --  1.9  --   --  2.0  PHOS  --   --  4.3   < > 4.6 6.6* 6.8* 5.4* 7.0*   < > = values in this interval not displayed.   GFR: Estimated Creatinine Clearance: 9.5 mL/min (A) (by C-G formula based on SCr of 6.59 mg/dL (H)). Liver Function Tests: Recent Labs  Lab 09/17/23 0232 09/18/23 0235 09/19/23 0225 09/20/23 0758 09/21/23 0219 09/22/23 0309 09/23/23 0415  AST 70* 47*  --  30  --   --  29  ALT 73* 39  --  21  --   --  18  ALKPHOS 108 99  --  107  --   --  101  BILITOT 0.6 0.7  --  0.9  --   --  0.6  PROT 7.2 6.7  --  7.0  --   --  8.0  ALBUMIN  2.4* 2.4*  2.4* 2.6* 2.4* 2.3* 2.3* 2.3*   No results for input(s): "LIPASE", "AMYLASE" in the last 168 hours. No results for input(s): "AMMONIA" in the last 168 hours. Coagulation Profile: No results for input(s): "INR", "PROTIME" in the last 168 hours. Cardiac Enzymes: No results for input(s): "CKTOTAL", "CKMB", "CKMBINDEX", "TROPONINI" in the last 168 hours. BNP (last 3 results) No results for input(s): "PROBNP" in the last 8760 hours. HbA1C: No results for input(s): "HGBA1C" in the last 72 hours. CBG: Recent Labs  Lab 09/22/23 0622 09/22/23 1132 09/22/23 1813 09/22/23 2110 09/23/23 0704  GLUCAP 98 99 104* 93 115*   Lipid Profile: No results for input(s): "CHOL", "HDL", "LDLCALC", "TRIG", "CHOLHDL", "LDLDIRECT" in the last 72 hours.  Thyroid  Function Tests: No results for input(s): "TSH", "T4TOTAL", "FREET4", "T3FREE", "THYROIDAB" in the last 72 hours. Anemia Panel: No results for input(s): "VITAMINB12", "FOLATE", "FERRITIN", "TIBC", "IRON", "RETICCTPCT" in the last 72 hours. Sepsis Labs: No results for input(s): "PROCALCITON", "LATICACIDVEN" in the last 168 hours.  Recent Results (from the past 240 hours)  Culture, blood (Routine X 2) w Reflex to ID Panel     Status: None   Collection Time: 09/13/23 10:11 AM   Specimen: BLOOD RIGHT HAND   Result Value Ref Range Status   Specimen Description BLOOD RIGHT HAND  Final   Special Requests   Final    BOTTLES DRAWN AEROBIC AND ANAEROBIC Blood Culture adequate volume   Culture   Final    NO GROWTH 5 DAYS Performed at [facility], [address] [place], [place] [address]    Report Status 09/18/2023 FINAL  Final  Culture, blood (Routine X 2) w Reflex to ID Panel     Status: None   Collection Time: 09/13/23 10:11 AM   Specimen: BLOOD RIGHT HAND  Result Value Ref Range Status   Specimen Description BLOOD RIGHT HAND  Final   Special Requests   Final    BOTTLES DRAWN AEROBIC AND ANAEROBIC Blood Culture adequate volume   Culture   Final    NO GROWTH 5 DAYS Performed at [facility], [address], [place], [place] [address]    Report Status 09/18/2023 FINAL  Final         Radiology Studies: No results found.      Scheduled Meds:  amiodarone   200 mg Oral BID   calcium  acetate  667 mg Oral TID WC   Chlorhexidine  Gluconate Cloth  6 each Topical Q0600   darbepoetin (ARANESP ) injection - DIALYSIS  100 mcg Subcutaneous Q Thu-1800   feeding supplement  1 Container Oral TID BM   fluticasone  furoate-vilanterol  1 puff Inhalation Daily   heparin  injection (subcutaneous)  5,000 Units Subcutaneous Q8H   heparin  sodium (porcine)  4,000 Units Intracatheter Once   insulin  aspart  0-20 Units Subcutaneous TID WC   metoprolol  tartrate  50 mg Oral BID   sodium chloride  flush  3 mL Intravenous Q12H   Continuous Infusions:        Audria Leather, MD [facility] 09/23/2023, 8:30 AM

## 2023-09-23 NOTE — Progress Notes (Signed)
 Noted pt received HD in the chair this morning and tolerated per nephrologist note. Contacted [facility] admissions and local HD clinic to provide this information. Awaiting final approval and schedule from [facility]. Will assist as needed.   Lauraine Polite Renal Navigator [phone]

## 2023-09-23 NOTE — TOC Progression Note (Addendum)
 Transition of Care [facility]) - Progression Note    Patient Details  Name: Christian [name] MRN: [id number] Date of Birth: [birth year]  [facility] CM/SW Contact  Carmon Christen, LCSWA Phone Number: 09/23/2023, 4:00 PM  Clinical Narrative:     Siegfried Dress approved for [facility] 7/82 - 6/3 NFAO#[id number] Renal navigator seeking OPHD chair time that [facility] can accommodate. TOC will continue to follow.  Expected Discharge Plan: Skilled Nursing Facility Barriers to Discharge: Continued Medical Work up, English as a second language teacher  Expected Discharge Plan and Services In-house Referral: Clinical Social Work   Post Acute Care Choice: Skilled Nursing Facility Living arrangements for the past 2 months: Single Family Home                                       Social Determinants of Health (SDOH) Interventions SDOH Screenings   Food Insecurity: No Food Insecurity (08/31/2023)  Housing: Low Risk  (08/31/2023)  Transportation Needs: No Transportation Needs (08/31/2023)  Utilities: Not At Risk (08/31/2023)  Alcohol Screen: Low Risk  (07/23/2021)  Depression (PHQ2-9): Low Risk  (01/18/2023)  Financial Resource Strain: Low Risk  (07/23/2021)  Physical Activity: Inactive (07/23/2021)  Social Connections: Moderately Integrated (08/31/2023)  Stress: No Stress Concern Present (07/23/2021)  Tobacco Use: Medium Risk (09/16/2023)    Readmission Risk Interventions     No data to display

## 2023-09-23 NOTE — Progress Notes (Incomplete)
 Patient

## 2023-09-23 NOTE — Progress Notes (Signed)
   09/23/23 1032  Assess: MEWS Score  BP (!) 80/52  Pulse Rate (!) 118  Level of Consciousness Responds to Pain  Assess: MEWS Score  MEWS Temp 0  MEWS Systolic 2  MEWS Pulse 2  MEWS RR 0  MEWS LOC 2  MEWS Score 6  MEWS Score Color Red  Assess: if the MEWS score is Yellow or Red  Were vital signs accurate and taken at a resting state? Yes  Does the patient meet 2 or more of the SIRS criteria? Yes  Does the patient have a confirmed or suspected source of infection? Yes  MEWS guidelines implemented  Yes, yellow  Treat  MEWS Interventions Considered administering scheduled or prn medications/treatments as ordered  Take Vital Signs  Increase Vital Sign Frequency  Yellow: Q2hr x1, continue Q4hrs until patient remains green for 12hrs  Escalate  MEWS: Escalate Yellow: Discuss with charge nurse and consider notifying provider and/or RRT  Notify: Charge Nurse/RN  Name of Charge Nurse/RN Notified Jill/Maria RN  Provider Notification  Provider Name/Title Audria Leather  Date Provider Notified 09/23/23  Time Provider Notified 1040  Method of Notification Call  Notification Reason Change in status;Other (Comment) (lethargic, low BP, not following instructions, high HR)  Provider response At bedside  Date of Provider Response 09/23/23  Time of Provider Response 1040  Notify: Rapid Response  Name of Rapid Response RN Notified Nicky RN  Date Rapid Response Notified 09/23/23  Time Rapid Response Notified 1025  Assess: SIRS CRITERIA  SIRS Temperature  0  SIRS Respirations  0  SIRS Pulse 1  SIRS WBC 1  SIRS Score Sum  2

## 2023-09-23 NOTE — Significant Event (Signed)
 Rapid Response Event Note   Reason for Call :  Lethargy after returning from HD  Initial Focused Assessment:  On arrival pt laying in bed. He responds to voice and light touch. Although, quickly falls back asleep. He is able to state his name. Skin cool to touch.  VS: Temp 98, HR 122, BP 80/52, RR 16, spO2 94% on 3L [place]. CBG 110  Md notified and to bedside. Pt becoming more responsive throughout event. He denies pain and is able to state his wife's name and what he feels like eating today.    Interventions:  PIV started Bolus Md to bedside   Plan of Care:  Continue to monitor pt neuro status and BP. Will recheck BP after IVF given. PO amio and metoprolol  due once BP improved to help with HR.   RN instructed to call with any changes or concerns.    Event Summary:   MD Notified: Dr Maury Space Call Time: 1028 Arrival Time:1032 End Time: 1105  Christian Bonier, RN

## 2023-09-23 NOTE — Plan of Care (Signed)
  Problem: Metabolic: Goal: Ability to maintain appropriate glucose levels will improve Outcome: Progressing   Problem: Activity: Goal: Risk for activity intolerance will decrease Outcome: Progressing   Problem: Nutritional: Goal: Maintenance of adequate nutrition will improve Outcome: Not Progressing

## 2023-09-23 NOTE — Procedures (Signed)
 Received patient in bed to unit.  Alert and oriented.  Informed consent signed and in chart.   TX duration: 3.5 hours  Patient tolerated well.  Transported back to the room  Alert, without acute distress.  Hand-off given to patient's nurse.   Access used: right cath Access issues: none  Total UF removed: 1.8 liters Medication(s) given: heparin    Clover Dao, RN Kidney Dialysis Unit

## 2023-09-23 NOTE — Progress Notes (Signed)
 [facility] Progress [name]   Assessment/ Plan:    Expand All Collapse All  Christian [name]   Assessment/ Plan: Pt is a 78 y.o. yo male with CAD status post CABG, underwent laparoscopic appendectomy on 5/7 developed postop ileus, AKI on CKD.   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  UA with pyuria and kidney ultrasound without hydronephrosis. No sign of renal recovery with worsening azotemia. First HD on 5/14, second HD on 5/15.  Received third HD 5/17 with 1.5 L UF, tolerated well.  Continues on TTS schedule.  Ended up getting into trouble fluid wise with delayed HD 5/22- got in middle of the night urgently-  then routine HD on 5/24-  TTS schedule while here CLIP in process- came in the chair and tolerated rx   # Vascular dialysis access: Patient has old AV fistula which was stenosed therefore the patient underwent fistulogram and angioplasty on 5/13.  On 5/14, the AV fistula is unable to cannulate even with small needle 17G.   TDC  with [facility], 5/22. [facility] to follow as an outpatient regarding his AV fistula   # Appendicitis with possible abscess and postop ileus: s/p  antibiotics and drain per surgical team.     # Acute blood loss anemia, CT angio negative for bleeding.  Transfuse as needed.  Started ESA 5/22.  No IV iron until infectious issues clearly resolved.     # CKD-MBD, hyperphosphatemia: Phosphorus stable and with poor p.o. is off sevelamer , PTH level 337, continue to monitor.   # Hypokalemia: Managed with HD, monitor lab.    Subjective:    Seen in HD unit- just completed dialysis- came in the chair!   Objective:   BP (!) 97/53 (BP Location: Right Arm)   Pulse (!) 120   Temp 99.4 F (37.4 C) (Axillary)   Resp 18   Ht 5\' 9"  (1.753 m)   Wt 82.9 kg   SpO2 98%   BMI 26.99 kg/m   Physical  Exam: Gen:NAD CVS:RRR Resp:clear, muffled at bases Abd: soft. Slightly distended OZH:YQMVH LE edema ACCESS: TDC and L AVF + T/B but has infiltration  Labs: BMET Recent Labs  Lab 09/17/23 0232 09/18/23 0235 09/19/23 0225 09/20/23 0758 09/21/23 0219 09/22/23 0309 09/23/23 0415  NA 143 136 137 135 136 136 136  K 4.5 3.4* 3.7 4.1 3.9 4.0 4.1  CL 104 94* 95* 95* 95* 97* 94*  CO2 22 25 24 23 25 25 23   GLUCOSE 202* 229* 123* 160* 119* 94 88  BUN 92* 51* 30* 48* 55* 30* 50*  CREATININE 4.18* 3.44* 3.07* 5.53* 6.64* 4.64* 6.59*  CALCIUM  9.3 8.8* 9.1 8.9 9.0 9.0 9.3  PHOS 4.3 4.9* 4.6 6.6* 6.8* 5.4* 7.0*   CBC Recent Labs  Lab 09/16/23 2249 09/17/23 0141 09/18/23 0235 09/23/23 0415  WBC 13.4*  --  13.2* 14.6*  NEUTROABS  --   --   --  10.2*  HGB 8.6* 7.8* 8.0* 8.0*  HCT 27.2* 23.0* 25.2* 25.5*  MCV 103.4*  --  101.2* 101.6*  PLT 317  --  277 410*      Medications:     amiodarone   200 mg Oral BID   calcium  acetate  667 mg Oral TID WC   Chlorhexidine  Gluconate Cloth  6 each Topical Q0600   darbepoetin (ARANESP )  injection - DIALYSIS  100 mcg Subcutaneous Q Thu-1800   feeding supplement  1 Container Oral TID BM   fluticasone  furoate-vilanterol  1 puff Inhalation Daily   heparin  injection (subcutaneous)  5,000 Units Subcutaneous Q8H   insulin  aspart  0-20 Units Subcutaneous TID WC   metoprolol  tartrate  50 mg Oral BID   sodium chloride  flush  3 mL Intravenous Q12H     Leandra Pro MD 09/23/2023, 10:26 AM

## 2023-09-23 NOTE — Progress Notes (Signed)
 Uurine culture and UA collected from foley per order- Per order not to removed and replace foley d/t difficult placement by urology- MD notified.

## 2023-09-24 ENCOUNTER — Inpatient Hospital Stay (HOSPITAL_COMMUNITY)

## 2023-09-24 DIAGNOSIS — K37 Unspecified appendicitis: Secondary | ICD-10-CM | POA: Diagnosis not present

## 2023-09-24 LAB — GLUCOSE, CAPILLARY
Glucose-Capillary: 97 mg/dL (ref 70–99)
Glucose-Capillary: 105 mg/dL — ABNORMAL HIGH (ref 70–99)
Glucose-Capillary: 123 mg/dL — ABNORMAL HIGH (ref 70–99)
Glucose-Capillary: 99 mg/dL (ref 70–99)

## 2023-09-24 LAB — RENAL FUNCTION PANEL
Albumin: 2.5 g/dL — ABNORMAL LOW (ref 3.5–5.0)
Anion gap: 18 — ABNORMAL HIGH (ref 5–15)
BUN: 42 mg/dL — ABNORMAL HIGH (ref 8–23)
CO2: 20 mmol/L — ABNORMAL LOW (ref 22–32)
Calcium: 9.5 mg/dL (ref 8.9–10.3)
Chloride: 94 mmol/L — ABNORMAL LOW (ref 98–111)
Creatinine, Ser: 5.11 mg/dL — ABNORMAL HIGH (ref 0.61–1.24)
GFR, Estimated: 11 mL/min — ABNORMAL LOW (ref >60)
Glucose, Bld: 92 mg/dL (ref 70–99)
Phosphorus: 7 mg/dL — ABNORMAL HIGH (ref 2.5–4.6)
Potassium: 4.2 mmol/L (ref 3.5–5.1)
Sodium: 132 mmol/L — ABNORMAL LOW (ref 135–145)

## 2023-09-24 MED ORDER — SENNOSIDES-DOCUSATE SODIUM 8.6-50 MG PO TABS
1.0000 | ORAL_TABLET | Freq: Two times a day (BID) | ORAL | Status: DC
  Administered 2023-09-24 – 2023-10-01 (×9): 1 via ORAL
  Filled 2023-09-24 (×10): qty 1

## 2023-09-24 MED ORDER — BISACODYL 10 MG RE SUPP
10.0000 mg | Freq: Every day | RECTAL | Status: DC | PRN
  Administered 2023-09-25: 10 mg via RECTAL
  Filled 2023-09-24: qty 1

## 2023-09-24 MED ORDER — METOPROLOL TARTRATE 25 MG PO TABS
25.0000 mg | ORAL_TABLET | Freq: Two times a day (BID) | ORAL | Status: DC
  Administered 2023-09-24 – 2023-09-25 (×3): 25 mg via ORAL
  Filled 2023-09-24 (×8): qty 1

## 2023-09-24 MED ORDER — POLYETHYLENE GLYCOL 3350 17 G PO PACK
17.0000 g | PACK | Freq: Every day | ORAL | Status: DC | PRN
  Administered 2023-09-24: 17 g via ORAL

## 2023-09-24 NOTE — Evaluation (Signed)
 Clinical/Bedside Swallow Evaluation Patient Details  Name: Christian [name] MRN: [id number] Date of Birth: Feb 16, 1946  Today's Date: 09/24/2023 Time: SLP Start Time (ACUTE ONLY): 1059 SLP Stop Time (ACUTE ONLY): 1108 SLP Time Calculation (min) (ACUTE ONLY): 9 min  Past Medical History:  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   Diabetic polyneuropathy    Elevated liver enzymes 08/09/2019   Hiatal hernia    History of coronary artery stent placement    History of pulmonary embolism 08/09/2019   HTN (hypertension) 11/12/2009   Hyperlipidemia associated with type 2 diabetes mellitus 11/12/2009   Left leg weakness 11/23/2018   Mild dementia, 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 LIMA TO LAD, RIMA TO PDA, RADIAL ARTERY TO CIRC AND SEQUENTIALLY TO OM1.   Sleep difficulties 05/30/2020   Type II diabetes mellitus 10/10/2014   Past Surgical History:  Past Surgical History:  Procedure Laterality Date   A/V FISTULAGRAM Left 08/12/2022   Procedure: A/V Fistulagram;  Surgeon: Kayla Part, MD;  Location: [facility] INVASIVE CV LAB;  Service: Cardiovascular;  Laterality: Left;   A/V FISTULAGRAM N/A 09/07/2023   Procedure: A/V Fistulagram;  Surgeon: Margherita Shell, MD;  Location: [facility] INVASIVE CV LAB;  Service: Cardiovascular;  Laterality: N/A;   APPLICATION OF WOUND VAC N/A 08/24/2019   Procedure: APPLICATION OF WOUND VAC;  Surgeon: Rudine Cos, MD;  Location: [facility] OR;  Service: Thoracic;  Laterality: N/A;   APPLICATION OF WOUND VAC   08/29/2019   Procedure: Wound Vac change;  Surgeon: Rudine Cos, MD;  Location: [facility] OR;  Service: Open Heart Surgery;;   APPLICATION OF WOUND VAC N/A 09/04/2019   Procedure: Application Of Wound Vac;  Surgeon: Norita Beauvais, MD;  Location: [facility] OR;  Service: Open Heart Surgery;  Laterality: N/A;   APPLICATION OF WOUND VAC N/A 09/06/2019   Procedure: APPLICATION OF ACELL, APPLICATION OF WOUND VAC USING PREVENA INCISIONAL  DRESSING;  Surgeon: Thornell Flirt, DO;  Location: [facility] OR;  Service: Plastics;  Laterality: N/A;   AV FISTULA PLACEMENT Left 09/18/2020   Procedure: ARTERIOVENOUS (AV) FISTULA CREATION LEFT VERSUS GRAFT;  Surgeon: Margherita Shell, MD;  Location: [facility] OR;  Service: Vascular;  Laterality: Left;   BASCILIC VEIN TRANSPOSITION Left 10/31/2020   Procedure: LEFT SECOND STAGE BASCILIC VEIN TRANSPOSITION;  Surgeon: Margherita Shell, MD;  [facility]: [facility] OR;  Service: Vascular;  Laterality: Left;   CARDIAC CATHETERIZATION     CORONARY ARTERY BYPASS GRAFT N/A 08/15/2019   Procedure: CORONARY ARTERY BYPASS GRAFTING (CABG), x 4 using bilateral IMAs and left radial artery .  LIMA TO LAD, RIMA TO PDA, RADIAL ARTERY TO CIRC AND SEQUENTIALLY TO OM1.;  Surgeon: Rudine Cos, MD;  Location: [facility] OR;  Service: Open Heart Surgery;  Laterality: N/A;   CORONARY STENT PLACEMENT  02/27/2014   distal rt/pd coronary       dr Berry [place]   CYSTO/ BLADDER BIOPSY'S/ CAUTHERIZATION  01-14-2004  DR Isla Mari   EXPLORATION POST OPERATIVE OPEN HEART N/A 08/16/2019   Procedure: Chest Closure  S?P CABG WITH APPLICATION OF PREVENA  INCISIONAL WOUND VAC;  Surgeon: Rudine Cos, MD;  [facility]: [facility] OR;  Service: Open Heart Surgery;  Laterality: N/A;   EXPLORATION POST OPERATIVE OPEN HEART N/A 08/21/2019   Procedure: CHEST WASHOUT S/P OPEN CHEST;  Surgeon: Rudine Cos, MD;  [facility]: [facility] OR;  Service: Open Heart Surgery;  Laterality: N/A;  Open chest with Esmark dressing with Ioban sealant coverage.   EXPLORATION  POST OPERATIVE OPEN HEART N/A 08/18/2019   Procedure: EXPLORATION POST OPERATIVE OPEN HEART (performed 04/23 on unit);  Surgeon: Rudine Cos, MD;  Location: [facility] OR;  Service: Open Heart Surgery;  Laterality: N/A;   EXPLORATION POST OPERATIVE OPEN HEART N/A 08/24/2019   Procedure: CHEST WASHOUT POST OPERATIVE OPEN HEART;  Surgeon: Rudine Cos, MD;  [facility];  Service: Open Heart Surgery;  Laterality: N/A;   EXPLORATION POST OPERATIVE OPEN HEART N/A 08/29/2019   Procedure: CHEST WOUND WASHOUT POST OPERATIVE OPEN HEART;  Surgeon: Rudine Cos, MD;  [facility];  Service: Open Heart Surgery;  Laterality: N/A;   EXPLORATION POST OPERATIVE OPEN HEART N/A 09/04/2019   Procedure: MEDIASTINAL EXPLORATION WITH STERNAL WOUND IRRIGATION;  Surgeon: Norita Beauvais, MD;  Location: [facility] OR;  Service: Open Heart Surgery;  Laterality: N/A;   EXPLORATION POST OPERATIVE OPEN HEART N/A 09/14/2019   Procedure: EVACUATION OF HEMATOMA;  Surgeon: Norita Beauvais, MD;  Location: [facility] OR;  Service: Open Heart Surgery;  Laterality: N/A;   INSERTION OF DIALYSIS CATHETER N/A 09/16/2023   Procedure: INSERTION OF DIALYSIS CATHETER;  Surgeon: Young Hensen, MD;  [facility]: [facility] OR;  Service: Vascular;  Laterality: N/A;  TUNNELED DIALYSIS CATHETER   IR FLUORO GUIDE CV LINE LEFT  09/19/2019   IR FLUORO GUIDE CV LINE RIGHT  09/08/2023   IR GASTROSTOMY TUBE MOD SED  10/04/2019   IR GASTROSTOMY TUBE REMOVAL  11/29/2019   IR PATIENT EVAL TECH 0-60 MINS  09/29/2019   IR US  GUIDE VASC ACCESS LEFT  09/19/2019   IR US  GUIDE VASC ACCESS RIGHT  09/08/2023   LAPAROSCOPIC APPENDECTOMY N/A 08/31/2023   Procedure: APPENDECTOMY, LAPAROSCOPIC;  Surgeon: Anda Bamberg, MD;  [facility] OR;  Service: General;  Laterality: N/A;   LAPAROSCOPIC LYSIS OF ADHESIONS N/A 09/06/2019   Procedure: LAPAROSCOPIC OMENTAL HARVEST;  Surgeon: Derral Flick, MD;  [facility]: [facility] OR;  Service: General;  Laterality: N/A;   LEFT HEART CATH  AND CORONARY ANGIOGRAPHY N/A 08/10/2019   Procedure: LEFT HEART CATH AND CORONARY ANGIOGRAPHY;  Surgeon: Cody Das, MD;  [facility] INVASIVE CV LAB;  Service: Cardiovascular;  Laterality: N/A;   LEFT HEART CATHETERIZATION WITH CORONARY ANGIOGRAM N/A 02/27/2014   Procedure: LEFT HEART CATHETERIZATION WITH CORONARY ANGIOGRAM;  Surgeon: Jessica Morn, MD;  Location: [facility] CATH LAB;  Service: Cardiovascular;  Laterality: N/A;   MEDIASTINAL EXPLORATION N/A 09/06/2019   Procedure: MEDIASTINAL EXPLORATION;  Surgeon: Norita Beauvais, MD;  Location: [facility] OR;  Service: Thoracic;  Laterality: N/A;   PECTORALIS FLAP  09/06/2019   Procedure: Pectoralis ADVANCEMENT Flap;  Surgeon: Thornell Flirt, DO;  Location: [facility] OR;  Service: Plastics;;   PERCUTANEOUS CORONARY STENT INTERVENTION (PCI-S)  02/27/2014   Procedure: PERCUTANEOUS CORONARY STENT INTERVENTION (PCI-S);  Surgeon: Jessica Morn, MD;  Location: [facility] CATH LAB;  Service: Cardiovascular;;  rt PDA  3.0/28mm Promus stent   PERIPHERAL VASCULAR INTERVENTION Left 08/12/2022   Procedure: PERIPHERAL VASCULAR INTERVENTION;  Surgeon: Kayla Part, MD;  Location: [facility]  INVASIVE CV LAB;  Service: Cardiovascular;  Laterality: Left;   RADIAL ARTERY HARVEST Left 08/15/2019   Procedure: Radial Artery Harvest;  Surgeon: Rudine Cos, MD;  Location: [facility] OR;  Service: Open Heart Surgery;  Laterality: Left;   REMOVAL OF A HERO DEVICE Right 09/16/2023   Procedure: REMOVAL OF TEMPORARY TUNNELED HEMODIALYSIS CATHETER;  Surgeon: Young Hensen, MD;  Location: [facility] OR;  Service: Vascular;  Laterality: Right;   RIB PLATING N/A 09/06/2019   Procedure: STERNAL PLATING;  Surgeon: Norita Beauvais, MD;  Location: [facility] OR;  Service: Thoracic;  Laterality: N/A;   TEE WITHOUT CARDIOVERSION N/A 08/15/2019   Procedure: TRANSESOPHAGEAL ECHOCARDIOGRAM (TEE);  Surgeon: Rudine Cos, MD;  Location: [facility] OR;  Service: Open Heart Surgery;  Laterality: N/A;   TRACHEOSTOMY  TUBE PLACEMENT  08/29/2019   Procedure: Tracheostomy;  Surgeon: Rudine Cos, MD;  [facility] OR;  Service: Open Heart Surgery; Decannulated 6/10/20211   TRANSURETHRAL RESECTION OF PROSTATE  04/04/2012   Procedure: TRANSURETHRAL RESECTION OF THE PROSTATE WITH GYRUS INSTRUMENTS;  Surgeon: Edmund Gouge, MD;  Location: [facility];  Service: Urology;  Laterality: N/A;   TRANSURETHRAL RESECTION OF PROSTATE N/A 09/27/2014   Procedure: TRANSURETHRAL RESECTION OF THE PROSTATE ;  Surgeon: Annamarie Kid, MD;  Location: [facility] ORS;  Service: Urology;  Laterality: N/A;   TUNNELLED CATHETER EXCHANGE N/A 09/07/2023   Procedure: TUNNELLED CATHETER EXCHANGE;  Surgeon: Margherita Shell, MD;  [facility]: [facility] INVASIVE CV LAB;  Service: Cardiovascular;  Laterality: N/A;   ULTRASOUND GUIDANCE FOR VASCULAR ACCESS Right 09/16/2023   Procedure: ULTRASOUND GUIDANCE, FOR VASCULAR ACCESS;  Surgeon: Young Hensen, MD;  Location: [facility] OR;  Service: Vascular;  Laterality: Right;   UPPER GASTROINTESTINAL ENDOSCOPY     HPI:  Christian [name] is a 79 year old male who underwent laparoscopic appendectomy by general surgery on 08/31/2023 followed by postoperative ileus requiring NG tube with hospital course complicated by acute kidney injury. CXR 5/29 with "Small bilateral pleural effusions with bibasilar atelectasis." Pna not excluded.  Pt with decreased PO intake the last few days. Ileus resolved. Pt passing flatus but no BM.  Pt with with history of CAD status post stenting and CABG, postop CVA, chronic kidney disease stage IV, hyperlipidemia, hypertension.    Assessment / Plan / Recommendation  Clinical Impression  Pt presents with functional swallowing as assessed clinically.  Pt tolerated all consistencies trialed with no clinical s/s of aspiration. Pt took serial straw sips of thin liquid with belch noted immediate afterwards.  Vocal quality remained clear.  Pts voice is high pitched and strained.  He  states this is abnormal; pt may be using ventricular folds for phonation.  Pt initially declined puree trials, but with encouragment accepted puree and regular solid boluses.  He exhibited good oral clearance.  There were no clinical s/s of aspiration.  Pt grimacing but denies odynophagia.  Pt reported needed air following puree trial.  RN came back to room to take vitals with pt showing SpO2 of 99.  Suspect pt's symptoms are GI related given recent hx of ileus and clinical presentation with belching following liquids.  Consider further GI/esophageal assessment if indicated.  Pt has no further ST needs.  SLP will sign off.  Recommend continuing regular texture diet and thin liquids.   SLP Visit Diagnosis: Dysphagia, unspecified (R13.10)    Aspiration Risk  No limitations    Diet Recommendation Regular;Thin liquid    Liquid Administration via: Cup;Straw Medication Administration: Whole meds with  liquid (as tolerated, no specific precautions) Compensations: Slow rate;Small sips/bites Postural Changes: Seated upright at 90 degrees    Other  Recommendations Recommended Consults: Consider GI evaluation;Consider esophageal assessment (if indicated) Oral Care Recommendations: Oral care BID    Recommendations for follow up therapy are one component of a multi-disciplinary discharge planning process, led by the attending physician.  Recommendations may be updated based on patient status, additional functional criteria and insurance authorization.  Follow up Recommendations No SLP follow up      Assistance Recommended at Discharge  N/A  Functional Status Assessment Patient has not had a recent decline in their functional status  Frequency and Duration  (N/A)          Prognosis Prognosis for improved oropharyngeal function:  (N/A)      Swallow Study   General HPI: Christian [name] is a 78 year old male who underwent laparoscopic appendectomy by general surgery on 08/31/2023 followed by  postoperative ileus requiring NG tube with hospital course complicated by acute kidney injury. CXR 5/29 with "Small bilateral pleural effusions with bibasilar atelectasis." Pna not excluded.  Pt with decreased PO intake the last few days. Ileus resolved. Pt passing flatus but no BM.  Pt with with history of CAD status post stenting and CABG, postop CVA, chronic kidney disease stage IV, hyperlipidemia, hypertension. Type of Study: Bedside Swallow Evaluation Previous Swallow Assessment: 2021 on reg/thin.  MBS 10/13/19 with penetration of thin liquid by consecutive sips but no aspiration Diet Prior to this Study: Regular;Thin liquids (Level 0) Temperature Spikes Noted: No Respiratory Status: Room air History of Recent Intubation: No Behavior/Cognition: Alert;Pleasant mood;Cooperative Oral Cavity Assessment: Within Functional Limits Oral Care Completed by SLP: No Oral Cavity - Dentition: Adequate natural dentition Patient Positioning: Upright in bed Baseline Vocal Quality:  (Thin, high pitched, strained, ?phonation on ventricular folds) Volitional Cough: Strong Volitional Swallow: Able to elicit    Oral/Motor/Sensory Function Overall Oral Motor/Sensory Function: Within functional limits   Ice Chips Ice chips: Not tested   Thin Liquid Thin Liquid: Within functional limits Presentation: Straw    Nectar Thick Nectar Thick Liquid: Not tested   Honey Thick Honey Thick Liquid: Not tested   Puree Puree: Within functional limits Presentation: Spoon   Solid     Solid: Within functional limits Presentation:  (SLP fed)      Christian Grim, MA, CCC-SLP Acute Rehabilitation Services Office: [phone] 09/24/2023,11:21 AM

## 2023-09-24 NOTE — Progress Notes (Signed)
 Pt has been accepted at [facility] on MWF 10:40 am chair time. Pt can start as soon as Monday if medically stable by then. Pt will need to arrive at 9:30 am for first appt to complete paperwork prior to treatment. Met with pt at bedside to discuss details. Pt requested that navigator discuss arrangements with pt's wife. Called wife and left a message requesting a return call. Schedule letter left for wife at pt's bedside. Update provided to attending, nephrologist, CSW, and pt's RN. Staff advised that navigator that pt for likely d/c early next week and to plan for a possible start date of Wed, June 4. Update provided to [facility] admissions and Zipporah Him regarding Wed start date. HD arrangements added to AVS and contacted renal PA regarding clinics need for orders at d/c. Will assist as needed.   Lauraine Polite Renal Navigator [phone]

## 2023-09-24 NOTE — Progress Notes (Signed)
 [facility] Progress Note   Assessment/ Plan:    Expand All Collapse All  [facility] NEPHROLOGY PROGRESS NOTE   Assessment/ Plan: Pt is a 78 y.o. yo male with CAD status post CABG, underwent laparoscopic appendectomy on 5/7 developed postop ileus, AKI on CKD.   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  UA with pyuria and kidney ultrasound without hydronephrosis. No sign of renal recovery with worsening azotemia. First HD on 5/14, second HD on 5/15.  Received third HD 5/17 with 1.5 L UF, tolerated well.  Continues on TTS schedule.  Ended up getting into trouble fluid wise with delayed HD 5/22- got in middle of the night urgently-  then routine HD on 5/24-  TTS schedule while here CLIP in process- came in the chair and tolerated rx HD tomorrow   # Vascular dialysis access: Patient has old AV fistula which was stenosed therefore the patient underwent fistulogram and angioplasty on 5/13.  On 5/14, the AV fistula is unable to cannulate even with small needle 17G.   TDC  with [facility], 5/22. [facility] to follow as an outpatient regarding his AV fistula   # Appendicitis with possible abscess and postop ileus: s/p  antibiotics and drain per surgical team.     # Acute blood loss anemia, CT angio negative for bleeding.  Transfuse as needed.  Started ESA 5/22.  No IV iron until infectious issues clearly resolved.     # CKD-MBD, hyperphosphatemia: Phosphorus stable and with poor p.o. is off sevelamer , PTH level 337, continue to monitor.   # Hypokalemia: Managed with HD, monitor lab.    Subjective:    NO issues.  Eating lunch   Objective:   BP 116/63   Pulse 97   Temp 98.1 F (36.7 C) (Oral)   Resp 15   Ht 5\' 9"  (1.753 m)   Wt 82.2 kg   SpO2 98%   BMI 26.76 kg/m   Physical Exam: Gen:NAD CVS:RRR Resp:clear, muffled at bases Abd:  soft. Slightly distended ZOX:WRUEA LE edema ACCESS: TDC and L AVF + T/B but has infiltration  Labs: BMET Recent Labs  Lab 09/19/23 0225 09/20/23 0758 09/21/23 0219 09/22/23 0309 09/23/23 0415 09/23/23 1304 09/24/23 0625  NA 137 135 136 136 136 134* 132*  K 3.7 4.1 3.9 4.0 4.1 4.4 4.2  CL 95* 95* 95* 97* 94* 92* 94*  CO2 24 23 25 25 23  20* 20*  GLUCOSE 123* 160* 119* 94 88 86 92  BUN 30* 48* 55* 30* 50* 22 42*  CREATININE 3.07* 5.53* 6.64* 4.64* 6.59* 3.51* 5.11*  CALCIUM  9.1 8.9 9.0 9.0 9.3 9.2 9.5  PHOS 4.6 6.6* 6.8* 5.4* 7.0* 4.6 7.0*   CBC Recent Labs  Lab 09/18/23 0235 09/23/23 0415  WBC 13.2* 14.6*  NEUTROABS  --  10.2*  HGB 8.0* 8.0*  HCT 25.2* 25.5*  MCV 101.2* 101.6*  PLT 277 410*      Medications:     amiodarone   200 mg Oral BID   calcium  acetate  667 mg Oral TID WC   Chlorhexidine  Gluconate Cloth  6 each Topical Q0600   darbepoetin (ARANESP ) injection - DIALYSIS  100 mcg Subcutaneous Q Thu-1800   feeding supplement  237 mL Oral BID BM   fluticasone  furoate-vilanterol  1 puff Inhalation Daily   heparin  injection (subcutaneous)  5,000 Units  Subcutaneous Q8H   insulin  aspart  0-20 Units Subcutaneous TID WC   metoprolol  tartrate  25 mg Oral BID   midodrine   10 mg Oral Once   midodrine   10 mg Oral TID WC   multivitamin  1 tablet Oral QHS   senna-docusate  1 tablet Oral BID   sodium chloride  flush  3 mL Intravenous Q12H   thiamine   100 mg Oral Daily     Leandra Pro MD 09/24/2023, 2:22 PM

## 2023-09-24 NOTE — Progress Notes (Signed)
 Physical Therapy Treatment Patient Details Name: Christian [name] MRN: [id number] DOB: Feb 20, 1946 Today's Date: 09/24/2023   History of Present Illness 78 y.o. male admitted 08/30/23 with RLQ pain, appendicitis and perforated appendix s/p laparoscopic appendectomy 5/6. Post op ileus. 5/23 Afib. PMHx: CAD s/p CABG, HTN, CKD, DMT2, BPH, multiple sclerosis, neuropathy, mild dementia    PT Comments  Pt received in supine and agreeable to session. Pt able to sit to EOB x2 with min-mod A and step by step cues. Pt reports dizziness and nausea once sitting EOB and returns to supine. Pt able to sit back to EOB for orthostatic vitals to be taken with noted drop in BP and pt reporting dizziness again. Pt returned to supine and educated on sitting up more throughout the day to prevent dizziness with position changes. Pt continues to benefit from PT services to progress toward functional mobility goals.    Orthostatic BPs  Supine 119/64  Sitting 101/88  Supine at end of session 106/55      If plan is discharge home, recommend the following: A lot of help with walking and/or transfers;Assistance with cooking/housework;Assist for transportation;Help with stairs or ramp for entrance;Supervision due to cognitive status   Can travel by private vehicle     No  Equipment Recommendations  Hospital bed;BSC/3in1;Wheelchair cushion (measurements PT);Wheelchair (measurements PT)    Recommendations for Other Services       Precautions / Restrictions Precautions Precautions: Fall Recall of Precautions/Restrictions: Impaired Precaution/Restrictions Comments: incontinent stool, watch sats and BP Restrictions Weight Bearing Restrictions Per Provider Order: No     Mobility  Bed Mobility Overal bed mobility: Needs Assistance Bed Mobility: Supine to Sit, Sit to Supine     Supine to sit: HOB elevated, Used rails, Min assist, Mod assist Sit to supine: Min assist, Used rails   General bed mobility comments:  min-mod A for trunk elevation and cues for sequencing and use of rail. Min A to elevate BLE back to EOB    Transfers                   General transfer comment: unable due to symptomatic orthostatic hypertension    Ambulation/Gait                   Stairs             Wheelchair Mobility     Tilt Bed    Modified Rankin (Stroke Patients Only)       Balance Overall balance assessment: Needs assistance Sitting-balance support: No upper extremity supported, Feet supported Sitting balance-Leahy Scale: Fair Sitting balance - Comments: sitting EOB                                    Communication Communication Communication: Impaired Factors Affecting Communication: Difficulty expressing self;Reduced clarity of speech  Cognition Arousal: Alert Behavior During Therapy: Flat affect, Anxious   PT - Cognitive impairments: Problem solving, Initiation, Safety/Judgement, No family/caregiver present to determine baseline, Awareness Difficult to assess due to: Impaired communication                       Following commands: Impaired Following commands impaired: Follows one step commands inconsistently    Cueing Cueing Techniques: Verbal cues, Gestural cues, Tactile cues  Exercises      General Comments General comments (skin integrity, edema, etc.): Pt with dizziness sitting EOB and BP 101/88  Pertinent Vitals/Pain Pain Assessment Pain Assessment: Faces Faces Pain Scale: Hurts even more Pain Location: back Pain Descriptors / Indicators: Grimacing, Guarding Pain Intervention(s): Limited activity within patient's tolerance, Monitored during session, Repositioned     PT Goals (current goals can now be found in the care plan section) Acute Rehab PT Goals Patient Stated Goal: to have less pain when moving PT Goal Formulation: With patient/family Time For Goal Achievement: 09/29/23 Progress towards PT goals: Progressing  toward goals    Frequency    Min 2X/week       AM-PAC PT "6 Clicks" Mobility   Outcome Measure  Help needed turning from your back to your side while in a flat bed without using bedrails?: A Little Help needed moving from lying on your back to sitting on the side of a flat bed without using bedrails?: A Lot Help needed moving to and from a bed to a chair (including a wheelchair)?: A Lot Help needed standing up from a chair using your arms (e.g., wheelchair or bedside chair)?: A Lot Help needed to walk in hospital room?: Total Help needed climbing 3-5 steps with a railing? : Total 6 Click Score: 11    End of Session   Activity Tolerance: Other (comment);Patient limited by pain (orthostatic hypertension) Patient left: with call bell/phone within reach;in bed;with family/visitor present;with nursing/sitter in room Nurse Communication: Mobility status PT Visit Diagnosis: Other abnormalities of gait and mobility (R26.89);Muscle weakness (generalized) (M62.81)     Time: 1610-9604 PT Time Calculation (min) (ACUTE ONLY): 31 min  Charges:    $Therapeutic Activity: 23-37 mins PT General Charges $$ ACUTE PT VISIT: 1 Visit                     [name], PTA Acute Rehabilitation Services Secure Chat Preferred  [phone]    [name] 09/24/2023, 4:18 PM

## 2023-09-24 NOTE — Plan of Care (Signed)
  Problem: Coping: Goal: Ability to adjust to condition or change in health will improve Outcome: Progressing   Problem: Pain Managment: Goal: General experience of comfort will improve and/or be controlled Outcome: Progressing   Problem: Nutritional: Goal: Maintenance of adequate nutrition will improve Outcome: Not Progressing   Problem: Clinical Measurements: Goal: Respiratory complications will improve Outcome: Not Progressing   Problem: Activity: Goal: Risk for activity intolerance will decrease Outcome: Not Progressing

## 2023-09-24 NOTE — TOC Progression Note (Signed)
 Transition of Care [facility]) - Progression Note    Patient Details  Name: Christian [name] MRN: [id number] Date of Birth: [birth year]  Transition of Care [facility]) CM/SW Contact  Carmon Christen, LCSWA Phone Number: 09/24/2023, 12:31 PM  Clinical Narrative:     CSW received message from [facility] renal navigator .Patient has been accepted at [facility] on MWF 10:40 am chair time. Patient can start on Monday and will need to arrive at 9:30 am for paperwork prior to treatment. SNF will need to send hoyer pad with pt to HD appt. CSW informed Bertell Broach with [facility] of patients HD information. Tanya informed CSW to check back on Monday to see if they have SNF bed available. CSW informed MD and Sherrlyn Dolores renal navigator. Insurance authorization approved for [facility] 7/82 - 6/3 [id number]#[id number]. TOC will continue to follow.  Expected Discharge Plan: Skilled Nursing Facility Barriers to Discharge: Continued Medical Work up, English as a second language teacher  Expected Discharge Plan and Services In-house Referral: Clinical Social Work   Post Acute Care Choice: Skilled Nursing Facility Living arrangements for the past 2 months: Single Family Home                                       Social Determinants of Health (SDOH) Interventions SDOH Screenings   Food Insecurity: No Food Insecurity (08/31/2023)  Housing: Low Risk  (08/31/2023)  Transportation Needs: No Transportation Needs (08/31/2023)  Utilities: Not At Risk (08/31/2023)  Alcohol Screen: Low Risk  (07/23/2021)  Depression (PHQ2-9): Low Risk  (01/18/2023)  Financial Resource Strain: Low Risk  (07/23/2021)  Physical Activity: Inactive (07/23/2021)  Social Connections: Moderately Integrated (08/31/2023)  Stress: No Stress Concern Present (07/23/2021)  Tobacco Use: Medium Risk (09/16/2023)    Readmission Risk Interventions     No data to display

## 2023-09-24 NOTE — Progress Notes (Signed)
 PROGRESS NOTE    Christian [name]  ZOX:[id number] DOB: 10-18-1945 DOA: 08/30/2023 PCP: [name], MD   Brief Narrative:  78 year old male with history of CAD status post stenting and CABG, postop CVA, chronic kidney disease stage IV, hyperlipidemia, hypertension underwent laparoscopic appendectomy by general surgery on 08/31/2023 followed by postoperative ileus requiring NG tube with hospital course complicated by acute kidney injury.  Nephrology was consulted and patient was started on hemodialysis.  He also developed paroxysmal A-fib with RVR: Cardiology was consulted.  He is currently on amiodarone  and metoprolol .  Subsequently, condition has improved.  Ileus has resolved and patient is tolerating diet.  General surgery and cardiology signed off on 09/21/2023.  PT recommending SNF placement.  Assessment & Plan:   Acute perforated appendicitis with abscess status post surgical intervention on 08/31/2023 followed by postop ileus -Patient completed 2 weeks course of Zosyn .  General surgery was involved and patient had JP drain and NG tube.  Subsequently condition has improved.  Ileus has resolved and patient is tolerating diet.  Off of antibiotics.  General surgery signed off on 09/21/2023: Outpatient follow-up with general surgery  Fever - Had a temperature spike of 101.7 on 09/23/2023.  Blood cultures performed: Negative so far.  Urine culture negative so far.  Chest x-ray showed no signs of infiltrates.  Catheterized UA showed pyuria and bacteriuria.  No temperature spikes since one isolated temperature spike.  Will hold off on antibiotics for now.  AKI on CKD stage IV with concerns for ATN/possible new ESRD Acute metabolic acidosis - Nephrology following.  Dialysis as per nephrology schedule and recommendations.  Will need outpatient hemodialysis unit arranged prior to discharge - Will need outpatient follow-up with vascular surgery regarding AV fistula  Acute blood loss  anemia -Required 1 unit packed red cell transfusion during this hospitalization.  Hemoglobin stable currently.  Monitor  Hyponatremia - Mild.  Monitor  Leukocytosis -Monitor  Paroxysmal A-fib with RVR - Currently has mild intermittent tachycardia.  Continue oral amiodarone  and metoprolol : Decrease dose of metoprolol  to 25 mg twice a day because of hypotension.  Outpatient follow-up with cardiology.  Cardiology signed off on 09/21/2023  Essential hypertension - Blood pressure intermittently on the lower side.  Started midodrine  on 09/23/2023.  Continue oral metoprolol  as discussed above  Diabetes mellitus type 2 - Continue CBGs with SSI  CAD with history of stenting and CABG - Stable.  Outpatient follow-up with cardiologist  Hypokalemia - Resolved.  Hyperphosphatemia - Managed by nephrology   DVT prophylaxis: Heparin  subcutaneous Code Status: Full Family Communication: None at bedside Disposition Plan: Status is: Inpatient Remains inpatient appropriate because: Of severity of illness.  Need for SNF placement.  Will monitor at least 1 more day in the hospital for any more temperature spikes.   Consultants: General Surgery/PCCM/nephrology/vascular surgery/cardiology/ID  Procedures: As above  Antimicrobials:  Anti-infectives (From admission, onward)    Start     Dose/Rate Route Frequency Ordered Stop   09/16/23 1300  ceFAZolin  (ANCEF ) IVPB 2g/100 mL premix        2 g 200 mL/hr over 30 Minutes Intravenous  Once 09/16/23 1255 09/16/23 1347   09/13/23 2000  ampicillin -sulbactam (UNASYN ) 1.5 g in sodium chloride  0.9 % 100 mL IVPB  Status:  Discontinued        1.5 g 200 mL/hr over 30 Minutes Intravenous Every 12 hours 09/13/23 1817 09/15/23 0943   08/31/23 2200  piperacillin -tazobactam (ZOSYN ) IVPB 2.25 g  Status:  Discontinued  2.25 g 100 mL/hr over 30 Minutes Intravenous Every 8 hours 08/31/23 1953 09/13/23 0858   08/31/23 0600  piperacillin -tazobactam (ZOSYN ) IVPB  3.375 g  Status:  Discontinued        3.375 g 100 mL/hr over 30 Minutes Intravenous Every 8 hours 08/31/23 0532 08/31/23 1953   08/30/23 2245  piperacillin -tazobactam (ZOSYN ) IVPB 3.375 g        3.375 g 100 mL/hr over 30 Minutes Intravenous  Once 08/30/23 2233 08/31/23 0005        Subjective: Patient seen and examined at bedside.  Had fever yesterday afternoon.  Has intermittent abdominal pain.  Denies any worsening shortness of breath, vomiting  objective: Vitals:   09/24/23 0022 09/24/23 0500 09/24/23 0509 09/24/23 0801  BP: 92/66  121/63 101/63  Pulse: (!) 104  (!) 105 (!) 105  Resp: 16  16 17   Temp: 98.3 F (36.8 C)  97.6 F (36.4 C) 97.9 F (36.6 C)  TempSrc: Oral  Oral Oral  SpO2: 100%  97% 98%  Weight:  82.2 kg    Height:        Intake/Output Summary (Last 24 hours) at 09/24/2023 0832 Last data filed at 09/24/2023 0400 Gross per 24 hour  Intake 200 ml  Output 1925 ml  Net -1725 ml   Filed Weights   09/22/23 0355 09/23/23 0415 09/24/23 0500  Weight: 82.9 kg 82.9 kg 82.2 kg    Examination:  General: On 0.5 L oxygen via nasal cannula.  No acute distress.  Chronically ill and deconditioned looking ENT/neck: No palpable neck masses or elevated JVD noted respiratory: Bilateral breath sounds at bases with scattered crackles CVS: Remains tachycardic intermittently; S1 and S2 are heard abdominal: Soft, nontender, distended slightly; no organomegaly, bowel sounds are heard normally Extremities: No clubbing; mild lower extremity edema present  CNS: Awake; still slow to respond and a poor historian.  No focal neurologic deficit.  Able to move extremities Lymph: No obvious palpable lymphadenopathy Skin: No obvious petechiae/rashes  psych: Showing no signs of agitation currently.  Flat affect currently musculoskeletal: No obvious joint tenderness/deformity     Data Reviewed: I have personally reviewed following labs and imaging studies  CBC: Recent Labs  Lab  09/18/23 0235 09/23/23 0415  WBC 13.2* 14.6*  NEUTROABS  --  10.2*  HGB 8.0* 8.0*  HCT 25.2* 25.5*  MCV 101.2* 101.6*  PLT 277 410*   Basic Metabolic Panel: Recent Labs  Lab 09/20/23 0758 09/21/23 0219 09/22/23 0309 09/23/23 0415 09/23/23 1304 09/24/23 0625  NA 135 136 136 136 134* 132*  K 4.1 3.9 4.0 4.1 4.4 4.2  CL 95* 95* 97* 94* 92* 94*  CO2 23 25 25 23  20* 20*  GLUCOSE 160* 119* 94 88 86 92  BUN 48* 55* 30* 50* 22 42*  CREATININE 5.53* 6.64* 4.64* 6.59* 3.51* 5.11*  CALCIUM  8.9 9.0 9.0 9.3 9.2 9.5  MG 1.9  --   --  2.0  --   --   PHOS 6.6* 6.8* 5.4* 7.0* 4.6 7.0*   GFR: Estimated Creatinine Clearance: 12.3 mL/min (A) (by C-G formula based on SCr of 5.11 mg/dL (H)). Liver Function Tests: Recent Labs  Lab 09/18/23 0235 09/19/23 0225 09/20/23 4098 09/21/23 0219 09/22/23 0309 09/23/23 0415 09/23/23 1304 09/24/23 0625  AST 47*  --  30  --   --  29  --   --   ALT 39  --  21  --   --  18  --   --  ALKPHOS 99  --  107  --   --  101  --   --   BILITOT 0.7  --  0.9  --   --  0.6  --   --   PROT 6.7  --  7.0  --   --  8.0  --   --   ALBUMIN  2.4*  2.4*   < > 2.4* 2.3* 2.3* 2.3* 2.5* 2.5*   < > = values in this interval not displayed.   No results for input(s): "LIPASE", "AMYLASE" in the last 168 hours. No results for input(s): "AMMONIA" in the last 168 hours. Coagulation Profile: No results for input(s): "INR", "PROTIME" in the last 168 hours. Cardiac Enzymes: No results for input(s): "CKTOTAL", "CKMB", "CKMBINDEX", "TROPONINI" in the last 168 hours. BNP (last 3 results) No results for input(s): "PROBNP" in the last 8760 hours. HbA1C: No results for input(s): "HGBA1C" in the last 72 hours. CBG: Recent Labs  Lab 09/23/23 1017 09/23/23 1216 09/23/23 1736 09/23/23 1938 09/24/23 0757  GLUCAP 110* 88 86 114* 97   Lipid Profile: No results for input(s): "CHOL", "HDL", "LDLCALC", "TRIG", "CHOLHDL", "LDLDIRECT" in the last 72 hours.  Thyroid  Function  Tests: No results for input(s): "TSH", "T4TOTAL", "FREET4", "T3FREE", "THYROIDAB" in the last 72 hours. Anemia Panel: No results for input(s): "VITAMINB12", "FOLATE", "FERRITIN", "TIBC", "IRON", "RETICCTPCT" in the last 72 hours. Sepsis Labs: No results for input(s): "PROCALCITON", "LATICACIDVEN" in the last 168 hours.  Recent Results (from the past 240 hours)  Culture, blood (Routine X 2) w Reflex to ID Panel     Status: None (Preliminary result)   Collection Time: 09/23/23  1:04 PM   Specimen: BLOOD  Result Value Ref Range Status   Specimen Description BLOOD SITE NOT SPECIFIED  Final   Special Requests   Final    BOTTLES DRAWN AEROBIC ONLY Blood Culture results may not be optimal due to an inadequate volume of blood received in culture bottles   Culture   Final    NO GROWTH < 24 HOURS Performed at [facility], [address], [place], [place] [address]    Report Status PENDING  Incomplete  Culture, blood (Routine X 2) w Reflex to ID Panel     Status: None (Preliminary result)   Collection Time: 09/23/23  1:04 PM   Specimen: BLOOD RIGHT HAND  Result Value Ref Range Status   Specimen Description BLOOD RIGHT HAND  Final   Special Requests   Final    BOTTLES DRAWN AEROBIC ONLY Blood Culture results may not be optimal due to an inadequate volume of blood received in culture bottles   Culture   Final    NO GROWTH < 24 HOURS Performed at [facility], [address] [place], [place] [address]    Report Status PENDING  Incomplete         Radiology Studies: DG CHEST PORT 1 VIEW Result Date: 09/23/2023 CLINICAL DATA:  Shortness of breath. EXAM: PORTABLE CHEST 1 VIEW COMPARISON:  Chest radiograph dated 09/17/2023. FINDINGS: Similar positioning of a right IJ catheter. Small bilateral pleural effusions with bibasilar atelectasis. Pneumonia is not excluded. Overall improved aeration of the lungs since the prior radiograph. No pneumothorax. Stable cardiac silhouette. No acute  osseous pathology. IMPRESSION: Small bilateral pleural effusions with bibasilar atelectasis. Electronically Signed   By: Angus Bark M.D.   On: 09/23/2023 17:33        Scheduled Meds:  amiodarone   200 mg Oral BID  calcium  acetate  667 mg Oral TID WC   Chlorhexidine  Gluconate Cloth  6 each Topical Q0600   darbepoetin (ARANESP ) injection - DIALYSIS  100 mcg Subcutaneous Q Thu-1800   feeding supplement  237 mL Oral BID BM   fluticasone  furoate-vilanterol  1 puff Inhalation Daily   heparin  injection (subcutaneous)  5,000 Units Subcutaneous Q8H   insulin  aspart  0-20 Units Subcutaneous TID WC   metoprolol  tartrate  25 mg Oral BID   midodrine   10 mg Oral Once   midodrine   10 mg Oral TID WC   multivitamin  1 tablet Oral QHS   sodium chloride  flush  3 mL Intravenous Q12H   thiamine   100 mg Oral Daily   Continuous Infusions:        Audria Leather, MD [facility] 09/24/2023, 8:32 AM

## 2023-09-25 DIAGNOSIS — K37 Unspecified appendicitis: Secondary | ICD-10-CM | POA: Diagnosis not present

## 2023-09-25 LAB — COMPREHENSIVE METABOLIC PANEL WITH GFR
ALT: 22 U/L (ref 0–44)
AST: 36 U/L (ref 15–41)
Albumin: 2.4 g/dL — ABNORMAL LOW (ref 3.5–5.0)
Alkaline Phosphatase: 98 U/L (ref 38–126)
Anion gap: 19 — ABNORMAL HIGH (ref 5–15)
BUN: 61 mg/dL — ABNORMAL HIGH (ref 8–23)
CO2: 22 mmol/L (ref 22–32)
Calcium: 9 mg/dL (ref 8.9–10.3)
Chloride: 91 mmol/L — ABNORMAL LOW (ref 98–111)
Creatinine, Ser: 6.83 mg/dL — ABNORMAL HIGH (ref 0.61–1.24)
GFR, Estimated: 8 mL/min — ABNORMAL LOW (ref >60)
Glucose, Bld: 98 mg/dL (ref 70–99)
Potassium: 4.5 mmol/L (ref 3.5–5.1)
Sodium: 132 mmol/L — ABNORMAL LOW (ref 135–145)
Total Bilirubin: 0.9 mg/dL (ref 0.0–1.2)
Total Protein: 8.2 g/dL — ABNORMAL HIGH (ref 6.5–8.1)

## 2023-09-25 LAB — RENAL FUNCTION PANEL
Albumin: 2.4 g/dL — ABNORMAL LOW (ref 3.5–5.0)
Anion gap: 18 — ABNORMAL HIGH (ref 5–15)
BUN: 61 mg/dL — ABNORMAL HIGH (ref 8–23)
CO2: 22 mmol/L (ref 22–32)
Calcium: 9 mg/dL (ref 8.9–10.3)
Chloride: 91 mmol/L — ABNORMAL LOW (ref 98–111)
Creatinine, Ser: 6.74 mg/dL — ABNORMAL HIGH (ref 0.61–1.24)
GFR, Estimated: 8 mL/min — ABNORMAL LOW (ref >60)
Glucose, Bld: 99 mg/dL (ref 70–99)
Phosphorus: 7.4 mg/dL — ABNORMAL HIGH (ref 2.5–4.6)
Potassium: 4.5 mmol/L (ref 3.5–5.1)
Sodium: 131 mmol/L — ABNORMAL LOW (ref 135–145)

## 2023-09-25 LAB — CBC WITH DIFFERENTIAL/PLATELET
Abs Immature Granulocytes: 1.47 10*3/uL — ABNORMAL HIGH (ref 0.00–0.07)
Basophils Absolute: 0.2 10*3/uL — ABNORMAL HIGH (ref 0.0–0.1)
Basophils Relative: 1 %
Eosinophils Absolute: 0.1 10*3/uL (ref 0.0–0.5)
Eosinophils Relative: 1 %
HCT: 28 % — ABNORMAL LOW (ref 39.0–52.0)
Hemoglobin: 9.1 g/dL — ABNORMAL LOW (ref 13.0–17.0)
Immature Granulocytes: 9 %
Lymphocytes Relative: 10 %
Lymphs Abs: 1.7 10*3/uL (ref 0.7–4.0)
MCH: 32.6 pg (ref 26.0–34.0)
MCHC: 32.5 g/dL (ref 30.0–36.0)
MCV: 100.4 fL — ABNORMAL HIGH (ref 80.0–100.0)
Monocytes Absolute: 1.7 10*3/uL — ABNORMAL HIGH (ref 0.1–1.0)
Monocytes Relative: 11 %
Neutro Abs: 10.8 10*3/uL — ABNORMAL HIGH (ref 1.7–7.7)
Neutrophils Relative %: 68 %
Platelets: 418 10*3/uL — ABNORMAL HIGH (ref 150–400)
RBC: 2.79 MIL/uL — ABNORMAL LOW (ref 4.22–5.81)
RDW: 16.2 % — ABNORMAL HIGH (ref 11.5–15.5)
Smear Review: NORMAL
WBC: 15.9 10*3/uL — ABNORMAL HIGH (ref 4.0–10.5)
nRBC: 0.2 % (ref 0.0–0.2)

## 2023-09-25 LAB — URINE CULTURE: Culture: NO GROWTH

## 2023-09-25 LAB — GLUCOSE, CAPILLARY
Glucose-Capillary: 117 mg/dL — ABNORMAL HIGH (ref 70–99)
Glucose-Capillary: 95 mg/dL (ref 70–99)
Glucose-Capillary: 98 mg/dL (ref 70–99)

## 2023-09-25 LAB — MAGNESIUM: Magnesium: 2.2 mg/dL (ref 1.7–2.4)

## 2023-09-25 LAB — C-REACTIVE PROTEIN: CRP: 39.3 mg/dL — ABNORMAL HIGH (ref <1.0)

## 2023-09-25 MED ORDER — ACETAMINOPHEN 325 MG PO TABS
ORAL_TABLET | ORAL | Status: AC
  Filled 2023-09-25: qty 1

## 2023-09-25 MED ORDER — HEPARIN SODIUM (PORCINE) 1000 UNIT/ML IJ SOLN
3800.0000 [IU] | Freq: Once | INTRAMUSCULAR | Status: AC
  Administered 2023-09-25: 3800 [IU]

## 2023-09-25 MED ORDER — OXYCODONE-ACETAMINOPHEN 5-325 MG PO TABS
ORAL_TABLET | ORAL | Status: AC
  Filled 2023-09-25: qty 1

## 2023-09-25 MED ORDER — HEPARIN SODIUM (PORCINE) 1000 UNIT/ML IJ SOLN
INTRAMUSCULAR | Status: AC
  Filled 2023-09-25: qty 4

## 2023-09-25 MED ORDER — MIDODRINE HCL 5 MG PO TABS
ORAL_TABLET | ORAL | Status: AC
Start: 2023-09-25 — End: ?
  Filled 2023-09-25: qty 1

## 2023-09-25 NOTE — Procedures (Signed)
 Patient seen and examined on Hemodialysis. The procedure was supervised and I have made appropriate changes. BP (!) 106/44   Pulse (!) 103   Temp 97.9 F (36.6 C)   Resp (!) 22   Ht 5\' 9"  (1.753 m)   Wt 77 kg   SpO2 100%   BMI 25.07 kg/m   QB 400 mL/ min, UF goal 2L--> 700 mL  Tolerating treatment without complaints at this time.   Leandra Pro MD [facility] [phone] 1:03 PM

## 2023-09-25 NOTE — Progress Notes (Signed)
 [facility] Progress Note   Assessment/ Plan:    Expand All Collapse All  [facility] NEPHROLOGY PROGRESS NOTE   Assessment/ Plan: Pt is a 78 y.o. yo male with CAD status post CABG, underwent laparoscopic appendectomy on 5/7 developed postop ileus, AKI on CKD.   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  UA with pyuria and kidney ultrasound without hydronephrosis. No sign of renal recovery with worsening azotemia. First HD on 5/14, second HD on 5/15.  Received third HD 5/17 with 1.5 L UF, tolerated well.  Continues on TTS schedule.  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  On 5/14, the AV fistula is unable to cannulate even with small needle 17G.   TDC  with [facility], 5/22. [facility] to follow as an outpatient regarding his AV fistula   # Appendicitis with possible abscess and postop ileus: s/p  antibiotics and drain per surgical team.     # Acute blood loss anemia, CT angio negative for bleeding.  Transfuse as needed.  Started ESA 5/22.  No IV iron until infectious issues clearly resolved.     # CKD-MBD, hyperphosphatemia: Phosphorus stable and with poor p.o. is off sevelamer , PTH level 337, continue to monitor.   # Hypokalemia: Managed with HD, monitor lab.    Subjective:    Wasn't able to come in chair today- in the bed.  Didn't get full UF goal- probably nearing EDW   Objective:   BP (!) 106/44   Pulse (!) 103   Temp 97.9 F (36.6 C)   Resp (!) 22   Ht 5\' 9"  (1.753 m)   Wt 77 kg   SpO2 100%   BMI 25.07  kg/m   Physical Exam: Gen:NAD CVS:RRR Resp:clear, muffled at bases Abd: soft. Slightly distended VHQ:IONGE LE edema ACCESS: TDC and L AVF + T/B but has infiltration  Labs: BMET Recent Labs  Lab 09/20/23 0758 09/21/23 0219 09/22/23 0309 09/23/23 0415 09/23/23 1304 09/24/23 0625 09/25/23 0522 09/25/23 0523  NA 135 136 136 136 134* 132* 132* 131*  K 4.1 3.9 4.0 4.1 4.4 4.2 4.5 4.5  CL 95* 95* 97* 94* 92* 94* 91* 91*  CO2 23 25 25 23  20* 20* 22 22  GLUCOSE 160* 119* 94 88 86 92 98 99  BUN 48* 55* 30* 50* 22 42* 61* 61*  CREATININE 5.53* 6.64* 4.64* 6.59* 3.51* 5.11* 6.83* 6.74*  CALCIUM  8.9 9.0 9.0 9.3 9.2 9.5 9.0 9.0  PHOS 6.6* 6.8* 5.4* 7.0* 4.6 7.0*  --  7.4*   CBC Recent Labs  Lab 09/23/23 0415 09/25/23 0522  WBC 14.6* 15.9*  NEUTROABS 10.2* 10.8*  HGB 8.0* 9.1*  HCT 25.5* 28.0*  MCV 101.6* 100.4*  PLT 410* 418*      Medications:     amiodarone   200 mg Oral BID   calcium  acetate  667 mg Oral TID WC   Chlorhexidine  Gluconate Cloth  6 each Topical Q0600   darbepoetin (ARANESP ) injection - DIALYSIS  100 mcg Subcutaneous Q  Thu-1800   feeding supplement  237 mL Oral BID BM   fluticasone  furoate-vilanterol  1 puff Inhalation Daily   heparin  injection (subcutaneous)  5,000 Units Subcutaneous Q8H   insulin  aspart  0-20 Units Subcutaneous TID WC   metoprolol  tartrate  25 mg Oral BID   midodrine   10 mg Oral TID WC   multivitamin  1 tablet Oral QHS   senna-docusate  1 tablet Oral BID   sodium chloride  flush  3 mL Intravenous Q12H   thiamine   100 mg Oral Daily     Leandra Pro MD 09/25/2023, 1:02 PM

## 2023-09-25 NOTE — Plan of Care (Signed)
  Problem: Education: Goal: Ability to describe self-care measures that may prevent or decrease complications (Diabetes Survival Skills Education) will improve Outcome: Progressing Goal: Individualized Educational Video(s) Outcome: Progressing   Problem: Coping: Goal: Ability to adjust to condition or change in health will improve Outcome: Progressing   Problem: Fluid Volume: Goal: Ability to maintain a balanced intake and output will improve Outcome: Progressing   Problem: Health Behavior/Discharge Planning: Goal: Ability to identify and utilize available resources and services will improve Outcome: Progressing Goal: Ability to manage health-related needs will improve Outcome: Progressing   Problem: Metabolic: Goal: Ability to maintain appropriate glucose levels will improve Outcome: Progressing   Problem: Nutritional: Goal: Maintenance of adequate nutrition will improve Outcome: Progressing Goal: Progress toward achieving an optimal weight will improve Outcome: Progressing   Problem: Skin Integrity: Goal: Risk for impaired skin integrity will decrease Outcome: Progressing   Problem: Tissue Perfusion: Goal: Adequacy of tissue perfusion will improve Outcome: Progressing   Problem: Education: Goal: Knowledge of General Education information will improve Description: Including pain rating scale, medication(s)/side effects and non-pharmacologic comfort measures Outcome: Progressing   Problem: Health Behavior/Discharge Planning: Goal: Ability to manage health-related needs will improve Outcome: Progressing   Problem: Clinical Measurements: Goal: Ability to maintain clinical measurements within normal limits will improve Outcome: Progressing Goal: Will remain free from infection Outcome: Progressing Goal: Diagnostic test results will improve Outcome: Progressing Goal: Respiratory complications will improve Outcome: Progressing Goal: Cardiovascular complication will  be avoided Outcome: Progressing   Problem: Activity: Goal: Risk for activity intolerance will decrease Outcome: Progressing   Problem: Nutrition: Goal: Adequate nutrition will be maintained Outcome: Progressing   Problem: Coping: Goal: Level of anxiety will decrease Outcome: Progressing   Problem: Elimination: Goal: Will not experience complications related to bowel motility Outcome: Progressing Goal: Will not experience complications related to urinary retention Outcome: Progressing   Problem: Pain Managment: Goal: General experience of comfort will improve and/or be controlled Outcome: Progressing   Problem: Safety: Goal: Ability to remain free from injury will improve Outcome: Progressing   Problem: Skin Integrity: Goal: Risk for impaired skin integrity will decrease Outcome: Progressing   Problem: Education: Goal: Knowledge of disease and its progression will improve Outcome: Progressing Goal: Individualized Educational Video(s) Outcome: Progressing   Problem: Fluid Volume: Goal: Compliance with measures to maintain balanced fluid volume will improve Outcome: Progressing   Problem: Health Behavior/Discharge Planning: Goal: Ability to manage health-related needs will improve Outcome: Progressing   Problem: Nutritional: Goal: Ability to make healthy dietary choices will improve Outcome: Progressing   Problem: Clinical Measurements: Goal: Complications related to the disease process, condition or treatment will be avoided or minimized Outcome: Progressing   Problem: Education: Goal: Understanding of CV disease, CV risk reduction, and recovery process will improve Outcome: Progressing Goal: Individualized Educational Video(s) Outcome: Progressing   Problem: Activity: Goal: Ability to return to baseline activity level will improve Outcome: Progressing   Problem: Cardiovascular: Goal: Ability to achieve and maintain adequate cardiovascular perfusion  will improve Outcome: Progressing   Problem: Health Behavior/Discharge Planning: Goal: Ability to safely manage health-related needs after discharge will improve Outcome: Progressing

## 2023-09-25 NOTE — Progress Notes (Signed)
 Received patient in bed.Alert and oriented x 2, person and place. Consent verified.  Access used: Right hd catheter,that worked well.Dressing change done today.  Duration of treatment : 3.5 hours.  UF goal : 750 cc.  Hemo comment: Not tolerating prescribed uf inspite of  Midodrine  10 mg was given.Was having cramping once tried to pull fluid from him . Tylenol   650 was given .  Hand off to the patient's nurse : Back into his room with stable medical condition via transporter.

## 2023-09-25 NOTE — Progress Notes (Signed)
 PROGRESS NOTE    Christian [name]  ZOX:[id number] DOB: 1945/12/31 DOA: 08/30/2023 PCP: [name], MD   Brief Narrative:  78 year old male with history of CAD status post stenting and CABG, postop CVA, chronic kidney disease stage IV, hyperlipidemia, hypertension underwent laparoscopic appendectomy by general surgery on 08/31/2023 followed by postoperative ileus requiring NG tube with hospital course complicated by acute kidney injury.  Nephrology was consulted and patient was started on hemodialysis.  He also developed paroxysmal A-fib with RVR: Cardiology was consulted.  He is currently on amiodarone  and metoprolol .  Subsequently, condition has improved.  Ileus has resolved and patient is tolerating diet.  General surgery and cardiology signed off on 09/21/2023.  PT recommending SNF placement.  Assessment & Plan:   Acute perforated appendicitis with abscess status post surgical intervention on 08/31/2023 followed by postop ileus -Patient completed 2 weeks course of Zosyn .  General surgery was involved and patient had JP drain and NG tube.  Subsequently condition has improved.  Ileus has resolved and patient is tolerating diet.  Off of antibiotics.  General surgery signed off on 09/21/2023: Outpatient follow-up with general surgery - Patient has not had a bowel movement since 09/18/2023: Stool softeners and laxatives ordered on 09/24/2023.  X-ray of abdomen revealed possible ileus.  Will ask general surgery to reevaluate the patient today as patient still complains of intermittent abdominal pain and has persistent leukocytosis with elevated CRP and had an episode of fever on 09/23/2023.  Fever - Had a temperature spike of 101.7 on 09/23/2023.  Blood cultures performed: Negative so far.  Urine culture negative so far.  Chest x-ray showed no signs of infiltrates.  Catheterized UA showed pyuria and bacteriuria.  No temperature spikes since then.  WBC still elevated with elevated CRP.  Will hold off on  antibiotics for now.  Will discuss with general surgery.  AKI on CKD stage IV with concerns for ATN/possible new ESRD Acute metabolic acidosis - Nephrology following.  Dialysis as per nephrology schedule and recommendations.  Will need outpatient hemodialysis unit arranged prior to discharge - Will need outpatient follow-up with vascular surgery regarding AV fistula  Acute blood loss anemia -Required 1 unit packed red cell transfusion during this hospitalization.  Hemoglobin stable currently.  Monitor  Hyponatremia - Mild.  Monitor.  Managed by nephrology by hemodialysis.  Leukocytosis -Monitor.  Still significantly elevated at 15.9.  Paroxysmal A-fib with RVR - Currently rate controlled.  Continue oral amiodarone  and metoprolol : Decreased dose of metoprolol  to 25 mg twice a day on 09/25/2023 because of hypotension.  Outpatient follow-up with cardiology.  Cardiology signed off on 09/21/2023  Essential hypertension - Blood pressure intermittently on the lower side.  Started midodrine  on 09/23/2023.  Continue oral metoprolol  as discussed above  Diabetes mellitus type 2 - Continue CBGs with SSI  CAD with history of stenting and CABG - Stable.  Outpatient follow-up with cardiologist  Hypokalemia - Resolved.  Hyperphosphatemia - Managed by nephrology   DVT prophylaxis: Heparin  subcutaneous Code Status: Full Family Communication: None at bedside Disposition Plan: Status is: Inpatient Remains inpatient appropriate because: Of severity of illness.  Need for SNF placement.    Consultants: General Surgery/PCCM/nephrology/vascular surgery/cardiology/ID  Procedures: As above  Antimicrobials:  Anti-infectives (From admission, onward)    Start     Dose/Rate Route Frequency Ordered Stop   09/16/23 1300  ceFAZolin  (ANCEF ) IVPB 2g/100 mL premix        2 g 200 mL/hr over 30 Minutes Intravenous  Once 09/16/23 1255  09/16/23 1347   09/13/23 2000  ampicillin -sulbactam (UNASYN ) 1.5 g in  sodium chloride  0.9 % 100 mL IVPB  Status:  Discontinued        1.5 g 200 mL/hr over 30 Minutes Intravenous Every 12 hours 09/13/23 1817 09/15/23 0943   08/31/23 2200  piperacillin -tazobactam (ZOSYN ) IVPB 2.25 g  Status:  Discontinued        2.25 g 100 mL/hr over 30 Minutes Intravenous Every 8 hours 08/31/23 1953 09/13/23 0858   08/31/23 0600  piperacillin -tazobactam (ZOSYN ) IVPB 3.375 g  Status:  Discontinued        3.375 g 100 mL/hr over 30 Minutes Intravenous Every 8 hours 08/31/23 0532 08/31/23 1953   08/30/23 2245  piperacillin -tazobactam (ZOSYN ) IVPB 3.375 g        3.375 g 100 mL/hr over 30 Minutes Intravenous  Once 08/30/23 2233 08/31/23 0005        Subjective: Patient seen and examined at bedside undergoing hemodialysis.  Poor historian.  Complains of abdominal pain and constipation.  No fever, seizures, agitation reported.   Objective: Vitals:   09/25/23 0356 09/25/23 0400 09/25/23 0500 09/25/23 0806  BP: 108/62   (!) 94/57  Pulse: 96   99  Resp: 15   (!) 29  Temp: 97.6 F (36.4 C)   97.9 F (36.6 C)  TempSrc: Axillary     SpO2: 97%   100%  Weight:  80.2 kg 80.2 kg 77.7 kg  Height:       No intake or output data in the 24 hours ending 09/25/23 0811  Filed Weights   09/25/23 0400 09/25/23 0500 09/25/23 0806  Weight: 80.2 kg 80.2 kg 77.7 kg    Examination:  General: No distress.  On room air currently.  Chronically ill and deconditioned looking ENT/neck: No palpable thyromegaly or JVD elevation noted respiratory: Decreased breath sounds at bases bilaterally with some crackles CVS: S1-S2 heard; rate controlled currently  abdominal: Soft, mildly tender, distended mildly; no organomegaly, normal bowel sounds are heard extremities: No clubbing; mild lower extremity edema present  CNS: Alert; remains slow to respond and a poor historian.  No focal deficits noted  lymph: No palpable lymphadenopathy  skin: No obvious ecchymosis/lesions psych: Mostly flat affect.   Currently not agitated  musculoskeletal: No obvious joint swelling/erythema     Data Reviewed: I have personally reviewed following labs and imaging studies  CBC: Recent Labs  Lab 09/23/23 0415 09/25/23 0522  WBC 14.6* 15.9*  NEUTROABS 10.2* 10.8*  HGB 8.0* 9.1*  HCT 25.5* 28.0*  MCV 101.6* 100.4*  PLT 410* 418*   Basic Metabolic Panel: Recent Labs  Lab 09/20/23 0758 09/21/23 0219 09/22/23 0309 09/23/23 0415 09/23/23 1304 09/24/23 0625 09/25/23 0522 09/25/23 0523  NA 135   < > 136 136 134* 132* 132* 131*  K 4.1   < > 4.0 4.1 4.4 4.2 4.5 4.5  CL 95*   < > 97* 94* 92* 94* 91* 91*  CO2 23   < > 25 23 20* 20* 22 22  GLUCOSE 160*   < > 94 88 86 92 98 99  BUN 48*   < > 30* 50* 22 42* 61* 61*  CREATININE 5.53*   < > 4.64* 6.59* 3.51* 5.11* 6.83* 6.74*  CALCIUM  8.9   < > 9.0 9.3 9.2 9.5 9.0 9.0  MG 1.9  --   --  2.0  --   --  2.2  --   PHOS 6.6*   < > 5.4* 7.0*  4.6 7.0*  --  7.4*   < > = values in this interval not displayed.   GFR: Estimated Creatinine Clearance: 9.3 mL/min (A) (by C-G formula based on SCr of 6.74 mg/dL (H)). Liver Function Tests: Recent Labs  Lab 09/20/23 0758 09/21/23 0219 09/23/23 0415 09/23/23 1304 09/24/23 0625 09/25/23 0522 09/25/23 0523  AST 30  --  29  --   --  36  --   ALT 21  --  18  --   --  22  --   ALKPHOS 107  --  101  --   --  98  --   BILITOT 0.9  --  0.6  --   --  0.9  --   PROT 7.0  --  8.0  --   --  8.2*  --   ALBUMIN  2.4*   < > 2.3* 2.5* 2.5* 2.4* 2.4*   < > = values in this interval not displayed.   No results for input(s): "LIPASE", "AMYLASE" in the last 168 hours. No results for input(s): "AMMONIA" in the last 168 hours. Coagulation Profile: No results for input(s): "INR", "PROTIME" in the last 168 hours. Cardiac Enzymes: No results for input(s): "CKTOTAL", "CKMB", "CKMBINDEX", "TROPONINI" in the last 168 hours. BNP (last 3 results) No results for input(s): "PROBNP" in the last 8760 hours. HbA1C: No results for  input(s): "HGBA1C" in the last 72 hours. CBG: Recent Labs  Lab 09/23/23 1938 09/24/23 0757 09/24/23 1155 09/24/23 1705 09/24/23 2137  GLUCAP 114* 97 105* 123* 99   Lipid Profile: No results for input(s): "CHOL", "HDL", "LDLCALC", "TRIG", "CHOLHDL", "LDLDIRECT" in the last 72 hours.  Thyroid  Function Tests: No results for input(s): "TSH", "T4TOTAL", "FREET4", "T3FREE", "THYROIDAB" in the last 72 hours. Anemia Panel: No results for input(s): "VITAMINB12", "FOLATE", "FERRITIN", "TIBC", "IRON", "RETICCTPCT" in the last 72 hours. Sepsis Labs: No results for input(s): "PROCALCITON", "LATICACIDVEN" in the last 168 hours.  Recent Results (from the past 240 hours)  Culture, blood (Routine X 2) w Reflex to ID Panel     Status: None (Preliminary result)   Collection Time: 09/23/23  1:04 PM   Specimen: BLOOD  Result Value Ref Range Status   Specimen Description BLOOD SITE NOT SPECIFIED  Final   Special Requests   Final    BOTTLES DRAWN AEROBIC ONLY Blood Culture results may not be optimal due to an inadequate volume of blood received in culture bottles   Culture   Final    NO GROWTH < 24 HOURS Performed at [facility], [address] [place], [place] [address]    Report Status PENDING  Incomplete  Culture, blood (Routine X 2) w Reflex to ID Panel     Status: None (Preliminary result)   Collection Time: 09/23/23  1:04 PM   Specimen: BLOOD RIGHT HAND  Result Value Ref Range Status   Specimen Description BLOOD RIGHT HAND  Final   Special Requests   Final    BOTTLES DRAWN AEROBIC ONLY Blood Culture results may not be optimal due to an inadequate volume of blood received in culture bottles   Culture   Final    NO GROWTH < 24 HOURS Performed at [facility], [address], [place], [place] [address]    Report Status PENDING  Incomplete         Radiology Studies: DG Abd 1 View Result Date: 09/24/2023 CLINICAL DATA:  Abdominal pain. EXAM: ABDOMEN - 1 VIEW COMPARISON:   09/17/2023 FINDINGS:  Decreasing gaseous small bowel distension. Increased air throughout nondilated small bowel loops persists in the central abdomen. Small volume of formed stool in the colon. No evidence of free air in the supine views. IMPRESSION: Decreasing gaseous small bowel distension. Persistent increased air throughout nondilated small bowel loops in the central abdomen may represent ileus. Electronically Signed   By: Chadwick Colonel M.D.   On: 09/24/2023 15:58   DG CHEST PORT 1 VIEW Result Date: 09/23/2023 CLINICAL DATA:  Shortness of breath. EXAM: PORTABLE CHEST 1 VIEW COMPARISON:  Chest radiograph dated 09/17/2023. FINDINGS: Similar positioning of a right IJ catheter. Small bilateral pleural effusions with bibasilar atelectasis. Pneumonia is not excluded. Overall improved aeration of the lungs since the prior radiograph. No pneumothorax. Stable cardiac silhouette. No acute osseous pathology. IMPRESSION: Small bilateral pleural effusions with bibasilar atelectasis. Electronically Signed   By: Angus Bark M.D.   On: 09/23/2023 17:33        Scheduled Meds:  amiodarone   200 mg Oral BID   calcium  acetate  667 mg Oral TID WC   Chlorhexidine  Gluconate Cloth  6 each Topical Q0600   darbepoetin (ARANESP ) injection - DIALYSIS  100 mcg Subcutaneous Q Thu-1800   feeding supplement  237 mL Oral BID BM   fluticasone  furoate-vilanterol  1 puff Inhalation Daily   heparin  injection (subcutaneous)  5,000 Units Subcutaneous Q8H   insulin  aspart  0-20 Units Subcutaneous TID WC   metoprolol  tartrate  25 mg Oral BID   midodrine   10 mg Oral Once   midodrine   10 mg Oral TID WC   multivitamin  1 tablet Oral QHS   senna-docusate  1 tablet Oral BID   sodium chloride  flush  3 mL Intravenous Q12H   thiamine   100 mg Oral Daily   Continuous Infusions:        Audria Leather, MD [facility] 09/25/2023, 8:11 AM

## 2023-09-25 NOTE — Consult Note (Signed)
 09/25/2023  Christian [name] [id number] [birth year]  CARE TEAM: PCP: [name], MD  Outpatient Care Team: Patient Care Team: [name], MD as PCP - General (Internal Medicine) Knox Perl, MD as PCP - Cardiology (Cardiology) Ruffin Cotton, DPM as Consulting Physician (Podiatry) McKenzie, Arden Beck, MD as Consulting Physician (Urology) Darien Eden, Johnny Nanas, MD as Referring Physician (Cardiology) Emory Harps (Neurology)  Inpatient Treatment Team: Treatment Team:  Audria Leather, MD Chucky Craver, MD Duffy, Mena Stai, MD Carmon Christen, Atlas Blank, NT Forestine Igo, NT [place], Alise Appl, RN Rannie Byars, RN Horton Maas, RN Ccs, Md, MD   Problem List:   Principal Problem:   Appendicitis Active Problems:   HTN (hypertension)   Type II diabetes mellitus   CAD (coronary artery disease)   Ischemic cardiomyopathy   S/P CABG x 4   Acute renal failure superimposed on stage 4 chronic kidney disease (HCC)   ABLA (acute blood loss anemia)   Paroxysmal atrial flutter (HCC)   Pure hypercholesterolemia  08/31/2023   Procedure: laparoscopic appendectomy   Pre-op  diagnosis: acute appendicitis, umbilical hernia  Post-op diagnosis: Grade 2 appendicitis: perforated appendix   Surgeon: Anda Bamberg, MD   Findings:  Specimen: appendix EBL: <5cc Drains/Implants: 71F JP, LLQ, terminating in the appendiceal fossa   09/16/2023  Procedure(s): INSERTION OF DIALYSIS CATHETER REMOVAL OF TEMPORARY TUNNELED HEMODIALYSIS CATHETER ULTRASOUND GUIDANCE, FOR VASCULAR ACCESS    Assessment [facility] Stay = 25 days) 9 Days Post-Op    Worsening abdominal pain of uncertain etiology.   Plan POD 25 - s/p lap appy with JP drain placement for perforated appendicitis [name] 5/6  -Completed postoperative Zosyn  5/21 -Surgical drain removed 5/21 - Concern for worsening pain and ileus.   - Worsening  leukocytosis. -Will order CAT scan abdomen pelvis to rule out delayed abscess formation.  If there is evidence and consider IR drainage and restarting antibiotics.  Get cultures to rule out resistant organism.  If normal, reassuring with fiber bowel regimen  - encourage OOB, ambulation -      FEN -CLD for now at most.  Make n.p.o. after midnight in case drain needed on 6/1 depending on CT scan findings Lines/tubes - tunneled HD cath 5/22 VVS, PIV x2 RUE, LUE w/ AV Fistula (non-functional) VTE - SQ heparin  ID - Zosyn  d/c 5/21     I updated the patient's status to the patient  Recommendations were made.  Questions were answered.  He expressed understanding & appreciation.  -Disposition: ??       I reviewed nursing notes, Consultant nephrology notes, hospitalist notes, last 24 h vitals and pain scores, last 48 h intake and output, last 24 h labs and trends, and last 24 h imaging results.  I have reviewed this patient's available data, including medical history, events of note, test results, etc as part of my evaluation.   A significant portion of that time was spent in counseling. Care during the described time interval was provided by me.  This care required moderate level of medical decision making.  09/25/2023    Subjective: (Chief complaint)  Patient with subjective fevers and worsening pain.  Surgical reconsultation requested.  In hemodialysis.  Denies any nausea or vomiting but does have discomfort in lower abdomen.  Objective:  Vital signs:  Vitals:   09/25/23 0806 09/25/23 0827 09/25/23 0830 09/25/23 0900  BP: (!) 94/57 107/60 107/60 (!) 91/53  Pulse: 99 95 90 (!) 104  Resp: (!) 29 (!) 31 (!) 21 (!) 28  Temp: 97.9 F (36.6 C)     TempSrc:      SpO2: 100% 97% 96% 100%  Weight: 77.7 kg     Height:        Last BM Date : 09/18/23  Intake/Output   Yesterday:  05/30 0701 - 05/31 0700 In: 120 [P.O.:120] Out: -  This shift:  No intake/output data  recorded.  Bowel function:  Flatus: YES  BM:  YES  Drain: (No drain)   Physical Exam:  General: Pt awake/alert in no acute distress Eyes: PERRL, normal EOM.  Sclera clear.  No icterus Neuro: CN II-XII intact w/o focal sensory/motor deficits. Lymph: No head/neck/groin lymphadenopathy Psych:  No delerium/psychosis/paranoia.  Oriented x 4 HENT: Normocephalic, Mucus membranes moist.  No thrush Neck: Supple, No tracheal deviation.  No obvious thyromegaly Chest: No pain to chest wall compression.  Good respiratory excursion.  No audible wheezing CV:  Pulses intact.  Regular rhythm.  No major extremity edema MS: Normal AROM mjr joints.  No obvious deformity  Abdomen: Soft.  Moderately distended.  Tenderness at right lower quadrant.  Mild guarding.  No diffuse peritonitis.  No incarcerated hernias.  Ext:  No deformity.  No mjr edema.  No cyanosis Skin: No petechiae / purpurea.  No major sores.  Warm and dry    Results:   Cultures: Recent Results (from the past 720 hours)  Culture, blood (Routine X 2) w Reflex to ID Panel     Status: None   Collection Time: 09/02/23  7:52 AM   Specimen: BLOOD RIGHT ARM  Result Value Ref Range Status   Specimen Description BLOOD RIGHT ARM  Final   Special Requests   Final    BOTTLES DRAWN AEROBIC AND ANAEROBIC Blood Culture adequate volume   Culture   Final    NO GROWTH 5 DAYS Performed at [facility], [address], [place], [place] [address]    Report Status 09/07/2023 FINAL  Final  Culture, blood (Routine X 2) w Reflex to ID Panel     Status: None   Collection Time: 09/02/23  7:52 AM   Specimen: BLOOD RIGHT ARM  Result Value Ref Range Status   Specimen Description BLOOD RIGHT ARM  Final   Special Requests   Final    BOTTLES DRAWN AEROBIC AND ANAEROBIC Blood Culture adequate volume   Culture   Final    NO GROWTH 5 DAYS Performed at [facility], [address] [place], [place] [address]    Report Status 09/07/2023 FINAL   Final  Culture, blood (Routine X 2) w Reflex to ID Panel     Status: None   Collection Time: 09/13/23 10:11 AM   Specimen: BLOOD RIGHT HAND  Result Value Ref Range Status   Specimen Description BLOOD RIGHT HAND  Final   Special Requests   Final    BOTTLES DRAWN AEROBIC AND ANAEROBIC Blood Culture adequate volume   Culture   Final    NO GROWTH 5 DAYS Performed at [facility], [address] [place], [place] [address]    Report Status 09/18/2023 FINAL  Final  Culture, blood (Routine X 2) w Reflex to ID Panel     Status: None   Collection Time: 09/13/23 10:11 AM   Specimen: BLOOD RIGHT HAND  Result Value Ref Range Status   Specimen Description BLOOD RIGHT HAND  Final   Special Requests   Final  BOTTLES DRAWN AEROBIC AND ANAEROBIC Blood Culture adequate volume   Culture   Final    NO GROWTH 5 DAYS Performed at [facility], [address], [place], [place] [address]    Report Status 09/18/2023 FINAL  Final  Culture, blood (Routine X 2) w Reflex to ID Panel     Status: None (Preliminary result)   Collection Time: 09/23/23  1:04 PM   Specimen: BLOOD  Result Value Ref Range Status   Specimen Description BLOOD SITE NOT SPECIFIED  Final   Special Requests   Final    BOTTLES DRAWN AEROBIC ONLY Blood Culture results may not be optimal due to an inadequate volume of blood received in culture bottles   Culture   Final    NO GROWTH 2 DAYS Performed at [facility], [address] [place], [place] [address]    Report Status PENDING  Incomplete  Culture, blood (Routine X 2) w Reflex to ID Panel     Status: None (Preliminary result)   Collection Time: 09/23/23  1:04 PM   Specimen: BLOOD RIGHT HAND  Result Value Ref Range Status   Specimen Description BLOOD RIGHT HAND  Final   Special Requests   Final    BOTTLES DRAWN AEROBIC ONLY Blood Culture results may not be optimal due to an inadequate volume of blood received in culture bottles   Culture   Final    NO GROWTH 2  DAYS Performed at [facility], [address] [place], [place] [address]    Report Status PENDING  Incomplete    Labs: Results for orders placed or performed during the hospital encounter of 08/30/23 (from the past 48 hours)  Glucose, capillary     Status: Abnormal   Collection Time: 09/23/23 10:17 AM  Result Value Ref Range   Glucose-Capillary 110 (H) 70 - 99 mg/dL    Comment: Glucose reference range applies only to samples taken after fasting for at least 8 hours.  Glucose, capillary     Status: None   Collection Time: 09/23/23 12:16 PM  Result Value Ref Range   Glucose-Capillary 88 70 - 99 mg/dL    Comment: Glucose reference range applies only to samples taken after fasting for at least 8 hours.  Urinalysis, Routine w reflex microscopic -Urine, Catheterized; Indwelling urinary catheter     Status: Abnormal   Collection Time: 09/23/23 12:32 PM  Result Value Ref Range   Color, Urine AMBER (A) YELLOW    Comment: BIOCHEMICALS MAY BE AFFECTED BY COLOR   APPearance CLOUDY (A) CLEAR   Specific Gravity, Urine 1.025 1.005 - 1.030   pH 5.0 5.0 - 8.0   Glucose, UA NEGATIVE NEGATIVE mg/dL   Hgb urine dipstick SMALL (A) NEGATIVE   Bilirubin Urine NEGATIVE NEGATIVE   Ketones, ur NEGATIVE NEGATIVE mg/dL   Protein, ur 914 (A) NEGATIVE mg/dL   Nitrite NEGATIVE NEGATIVE   Leukocytes,Ua MODERATE (A) NEGATIVE   RBC / HPF 6-10 0 - 5 RBC/hpf   WBC, UA 11-20 0 - 5 WBC/hpf   Bacteria, UA FEW (A) NONE SEEN   Squamous Epithelial / HPF 0-5 0 - 5 /HPF   Mucus PRESENT    Amorphous Crystal PRESENT     Comment: Performed at [facility], [address], [place], [place] [address]  Renal function panel     Status: Abnormal   Collection Time: 09/23/23  1:04 PM  Result Value Ref Range   Sodium 134 (L) 135 -  145 mmol/L   Potassium 4.4 3.5 - 5.1 mmol/L   Chloride 92 (L) 98 - 111 mmol/L   CO2 20 (L) 22 - 32 mmol/L   Glucose, Bld 86 70 - 99 mg/dL    Comment: Glucose reference range applies  only to samples taken after fasting for at least 8 hours.   BUN 22 8 - 23 mg/dL   Creatinine, Ser 1.19 (H) 0.61 - 1.24 mg/dL   Calcium  9.2 8.9 - 10.3 mg/dL   Phosphorus 4.6 2.5 - 4.6 mg/dL   Albumin  2.5 (L) 3.5 - 5.0 g/dL   GFR, Estimated 17 (L) >60 mL/min    Comment: (NOTE) Calculated using the CKD-EPI Creatinine Equation (2021)    Anion gap 22 (H) 5 - 15    Comment: ELECTROLYTES REPEATED TO VERIFY Performed at [facility], [address], [place], [place] [address]   Culture, blood (Routine X 2) w Reflex to ID Panel     Status: None (Preliminary result)   Collection Time: 09/23/23  1:04 PM   Specimen: BLOOD  Result Value Ref Range   Specimen Description BLOOD SITE NOT SPECIFIED    Special Requests      BOTTLES DRAWN AEROBIC ONLY Blood Culture results may not be optimal due to an inadequate volume of blood received in culture bottles   Culture      NO GROWTH 2 DAYS Performed at [facility], [address] [place], [place] [address]    Report Status PENDING   Culture, blood (Routine X 2) w Reflex to ID Panel     Status: None (Preliminary result)   Collection Time: 09/23/23  1:04 PM   Specimen: BLOOD RIGHT HAND  Result Value Ref Range   Specimen Description BLOOD RIGHT HAND    Special Requests      BOTTLES DRAWN AEROBIC ONLY Blood Culture results may not be optimal due to an inadequate volume of blood received in culture bottles   Culture      NO GROWTH 2 DAYS Performed at [facility], [address], [place], [place] [address]    Report Status PENDING   Glucose, capillary     Status: None   Collection Time: 09/23/23  5:36 PM  Result Value Ref Range   Glucose-Capillary 86 70 - 99 mg/dL    Comment: Glucose reference range applies only to samples taken after fasting for at least 8 hours.  Glucose, capillary     Status: Abnormal   Collection Time: 09/23/23  7:38 PM  Result Value Ref Range   Glucose-Capillary 114 (H) 70 - 99 mg/dL    Comment: Glucose  reference range applies only to samples taken after fasting for at least 8 hours.  Renal function panel     Status: Abnormal   Collection Time: 09/24/23  6:25 AM  Result Value Ref Range   Sodium 132 (L) 135 - 145 mmol/L   Potassium 4.2 3.5 - 5.1 mmol/L   Chloride 94 (L) 98 - 111 mmol/L   CO2 20 (L) 22 - 32 mmol/L   Glucose, Bld 92 70 - 99 mg/dL    Comment: Glucose reference range applies only to samples taken after fasting for at least 8 hours.   BUN 42 (H) 8 - 23 mg/dL   Creatinine, Ser 7.84 (H) 0.61 - 1.24 mg/dL   Calcium  9.5 8.9 - 10.3 mg/dL   Phosphorus 7.0 (H) 2.5 - 4.6 mg/dL   Albumin  2.5 (L) 3.5 - 5.0 g/dL  GFR, Estimated 11 (L) >60 mL/min    Comment: (NOTE) Calculated using the CKD-EPI Creatinine Equation (2021)    Anion gap 18 (H) 5 - 15    Comment: Performed at [facility] Lab, [address], [place], [place] [address]  Glucose, capillary     Status: None   Collection Time: 09/24/23  7:57 AM  Result Value Ref Range   Glucose-Capillary 97 70 - 99 mg/dL    Comment: Glucose reference range applies only to samples taken after fasting for at least 8 hours.  Glucose, capillary     Status: Abnormal   Collection Time: 09/24/23 11:55 AM  Result Value Ref Range   Glucose-Capillary 105 (H) 70 - 99 mg/dL    Comment: Glucose reference range applies only to samples taken after fasting for at least 8 hours.  Glucose, capillary     Status: Abnormal   Collection Time: 09/24/23  5:05 PM  Result Value Ref Range   Glucose-Capillary 123 (H) 70 - 99 mg/dL    Comment: Glucose reference range applies only to samples taken after fasting for at least 8 hours.  Glucose, capillary     Status: None   Collection Time: 09/24/23  9:37 PM  Result Value Ref Range   Glucose-Capillary 99 70 - 99 mg/dL    Comment: Glucose reference range applies only to samples taken after fasting for at least 8 hours.  CBC with Differential/Platelet     Status: Abnormal   Collection Time: 09/25/23  5:22 AM   Result Value Ref Range   WBC 15.9 (H) 4.0 - 10.5 K/uL   RBC 2.79 (L) 4.22 - 5.81 MIL/uL   Hemoglobin 9.1 (L) 13.0 - 17.0 g/dL   HCT 52.8 (L) 41.3 - 24.4 %   MCV 100.4 (H) 80.0 - 100.0 fL   MCH 32.6 26.0 - 34.0 pg   MCHC 32.5 30.0 - 36.0 g/dL   RDW 01.0 (H) 27.2 - 53.6 %   Platelets 418 (H) 150 - 400 K/uL   nRBC 0.2 0.0 - 0.2 %   Neutrophils Relative % 68 %   Neutro Abs 10.8 (H) 1.7 - 7.7 K/uL   Lymphocytes Relative 10 %   Lymphs Abs 1.7 0.7 - 4.0 K/uL   Monocytes Relative 11 %   Monocytes Absolute 1.7 (H) 0.1 - 1.0 K/uL   Eosinophils Relative 1 %   Eosinophils Absolute 0.1 0.0 - 0.5 K/uL   Basophils Relative 1 %   Basophils Absolute 0.2 (H) 0.0 - 0.1 K/uL   WBC Morphology Mild Left Shift (1-5% metas, occ myelo)    RBC Morphology See Note    Smear Review Normal platelet morphology    Immature Granulocytes 9 %   Abs Immature Granulocytes 1.47 (H) 0.00 - 0.07 K/uL   Polychromasia PRESENT     Comment: Performed at [facility], [address], [place], [place] [address]  Comprehensive metabolic panel with GFR     Status: Abnormal   Collection Time: 09/25/23  5:22 AM  Result Value Ref Range   Sodium 132 (L) 135 - 145 mmol/L   Potassium 4.5 3.5 - 5.1 mmol/L   Chloride 91 (L) 98 - 111 mmol/L   CO2 22 22 - 32 mmol/L   Glucose, Bld 98 70 - 99 mg/dL    Comment: Glucose reference range applies only to samples taken after fasting for at least 8 hours.   BUN 61 (H) 8 - 23 mg/dL   Creatinine, Ser 4.74 (H)  0.61 - 1.24 mg/dL   Calcium  9.0 8.9 - 10.3 mg/dL   Total Protein 8.2 (H) 6.5 - 8.1 g/dL   Albumin  2.4 (L) 3.5 - 5.0 g/dL   AST 36 15 - 41 U/L   ALT 22 0 - 44 U/L   Alkaline Phosphatase 98 38 - 126 U/L   Total Bilirubin 0.9 0.0 - 1.2 mg/dL   GFR, Estimated 8 (L) >60 mL/min    Comment: (NOTE) Calculated using the CKD-EPI Creatinine Equation (2021)    Anion gap 19 (H) 5 - 15    Comment: Performed at [facility], [address], [place], [place] [address]  Magnesium       Status: None   Collection Time: 09/25/23  5:22 AM  Result Value Ref Range   Magnesium  2.2 1.7 - 2.4 mg/dL    Comment: Performed at [facility], [address], [place], [place] [address]  C-reactive protein     Status: Abnormal   Collection Time: 09/25/23  5:22 AM  Result Value Ref Range   CRP 39.3 (H) <1.0 mg/dL    Comment: Performed at [facility], [address], [place], [place] [address]  Renal function panel     Status: Abnormal   Collection Time: 09/25/23  5:23 AM  Result Value Ref Range   Sodium 131 (L) 135 - 145 mmol/L   Potassium 4.5 3.5 - 5.1 mmol/L   Chloride 91 (L) 98 - 111 mmol/L   CO2 22 22 - 32 mmol/L   Glucose, Bld 99 70 - 99 mg/dL    Comment: Glucose reference range applies only to samples taken after fasting for at least 8 hours.   BUN 61 (H) 8 - 23 mg/dL   Creatinine, Ser 9.14 (H) 0.61 - 1.24 mg/dL   Calcium  9.0 8.9 - 10.3 mg/dL   Phosphorus 7.4 (H) 2.5 - 4.6 mg/dL   Albumin  2.4 (L) 3.5 - 5.0 g/dL   GFR, Estimated 8 (L) >60 mL/min    Comment: (NOTE) Calculated using the CKD-EPI Creatinine Equation (2021)    Anion gap 18 (H) 5 - 15    Comment: Performed at [facility] Lab, [address], [place], [place] [address]    Imaging / Studies: DG Abd 1 View Result Date: 09/24/2023 CLINICAL DATA:  Abdominal pain. EXAM: ABDOMEN - 1 VIEW COMPARISON:  09/17/2023 FINDINGS: Decreasing gaseous small bowel distension. Increased air throughout nondilated small bowel loops persists in the central abdomen. Small volume of formed stool in the colon. No evidence of free air in the supine views. IMPRESSION: Decreasing gaseous small bowel distension. Persistent increased air throughout nondilated small bowel loops in the central abdomen may represent ileus. Electronically Signed   By: Chadwick Colonel M.D.   On: 09/24/2023 15:58   DG CHEST PORT 1 VIEW Result Date: 09/23/2023 CLINICAL DATA:  Shortness of breath. EXAM: PORTABLE CHEST 1 VIEW COMPARISON:  Chest  radiograph dated 09/17/2023. FINDINGS: Similar positioning of a right IJ catheter. Small bilateral pleural effusions with bibasilar atelectasis. Pneumonia is not excluded. Overall improved aeration of the lungs since the prior radiograph. No pneumothorax. Stable cardiac silhouette. No acute osseous pathology. IMPRESSION: Small bilateral pleural effusions with bibasilar atelectasis. Electronically Signed   By: Angus Bark M.D.   On: 09/23/2023 17:33    Medications / Allergies: per chart  Antibiotics: Anti-infectives (From admission, onward)    Start     Dose/Rate Route Frequency Ordered Stop   09/16/23 1300  ceFAZolin  (ANCEF )  IVPB 2g/100 mL premix        2 g 200 mL/hr over 30 Minutes Intravenous  Once 09/16/23 1255 09/16/23 1347   09/13/23 2000  ampicillin -sulbactam (UNASYN ) 1.5 g in sodium chloride  0.9 % 100 mL IVPB  Status:  Discontinued        1.5 g 200 mL/hr over 30 Minutes Intravenous Every 12 hours 09/13/23 1817 09/15/23 0943   08/31/23 2200  piperacillin -tazobactam (ZOSYN ) IVPB 2.25 g  Status:  Discontinued        2.25 g 100 mL/hr over 30 Minutes Intravenous Every 8 hours 08/31/23 1953 09/13/23 0858   08/31/23 0600  piperacillin -tazobactam (ZOSYN ) IVPB 3.375 g  Status:  Discontinued        3.375 g 100 mL/hr over 30 Minutes Intravenous Every 8 hours 08/31/23 0532 08/31/23 1953   08/30/23 2245  piperacillin -tazobactam (ZOSYN ) IVPB 3.375 g        3.375 g 100 mL/hr over 30 Minutes Intravenous  Once 08/30/23 2233 08/31/23 0005         Note: Portions of this report may have been transcribed using voice recognition software. Every effort was made to ensure accuracy; however, inadvertent computerized transcription errors may be present.   Any transcriptional errors that result from this process are unintentional.    Eddye Goodie, MD, FACS, MASCRS Esophageal, Gastrointestinal & Colorectal Surgery Robotic and Minimally Invasive Surgery  [facility] [address], [address] [place], [place] [address] [phone] Fax [phone] Main  CONTACT INFORMATION: Weekday (9AM-5PM): Call [facility] main office at [phone] Weeknight (5PM-9AM) or Weekend/Holiday: Check EPIC "Web Links" tab & use "AMION" (password " [email or web address]") for General Surgery [facility] coverage  Please, DO NOT use SecureChat  (it is not reliable communication to reach operating surgeons & will lead to a delay in care).   Epic staff messaging available for outptient concerns needing 1-2 business day response.      09/25/2023  9:27 AM

## 2023-09-26 ENCOUNTER — Inpatient Hospital Stay (HOSPITAL_COMMUNITY)

## 2023-09-26 DIAGNOSIS — K37 Unspecified appendicitis: Secondary | ICD-10-CM | POA: Diagnosis not present

## 2023-09-26 LAB — GLUCOSE, CAPILLARY
Glucose-Capillary: 86 mg/dL (ref 70–99)
Glucose-Capillary: 93 mg/dL (ref 70–99)
Glucose-Capillary: 89 mg/dL (ref 70–99)
Glucose-Capillary: 114 mg/dL — ABNORMAL HIGH (ref 70–99)
Glucose-Capillary: 115 mg/dL — ABNORMAL HIGH (ref 70–99)

## 2023-09-26 LAB — RENAL FUNCTION PANEL
Albumin: 2.3 g/dL — ABNORMAL LOW (ref 3.5–5.0)
Anion gap: 19 — ABNORMAL HIGH (ref 5–15)
BUN: 36 mg/dL — ABNORMAL HIGH (ref 8–23)
CO2: 23 mmol/L (ref 22–32)
Calcium: 9.3 mg/dL (ref 8.9–10.3)
Chloride: 92 mmol/L — ABNORMAL LOW (ref 98–111)
Creatinine, Ser: 4.74 mg/dL — ABNORMAL HIGH (ref 0.61–1.24)
GFR, Estimated: 12 mL/min — ABNORMAL LOW (ref >60)
Glucose, Bld: 90 mg/dL (ref 70–99)
Phosphorus: 4.9 mg/dL — ABNORMAL HIGH (ref 2.5–4.6)
Potassium: 4.2 mmol/L (ref 3.5–5.1)
Sodium: 134 mmol/L — ABNORMAL LOW (ref 135–145)

## 2023-09-26 LAB — PROTIME-INR
INR: 1.1 (ref 0.8–1.2)
Prothrombin Time: 14.2 s (ref 11.4–15.2)

## 2023-09-26 MED ORDER — PIPERACILLIN-TAZOBACTAM IN DEX 2-0.25 GM/50ML IV SOLN
2.2500 g | Freq: Three times a day (TID) | INTRAVENOUS | Status: DC
  Administered 2023-09-26 – 2023-09-29 (×9): 2.25 g via INTRAVENOUS
  Filled 2023-09-26 (×13): qty 50

## 2023-09-26 MED ORDER — ACETAMINOPHEN 500 MG PO TABS
1000.0000 mg | ORAL_TABLET | Freq: Three times a day (TID) | ORAL | Status: DC
  Administered 2023-09-26 – 2023-09-28 (×6): 1000 mg via ORAL
  Filled 2023-09-26 (×7): qty 2

## 2023-09-26 MED ORDER — POLYETHYLENE GLYCOL 3350 17 G PO PACK
17.0000 g | PACK | Freq: Two times a day (BID) | ORAL | Status: DC
  Administered 2023-09-26 (×2): 17 g via ORAL
  Filled 2023-09-26 (×4): qty 1

## 2023-09-26 MED ORDER — BISACODYL 10 MG RE SUPP
10.0000 mg | Freq: Every day | RECTAL | Status: DC
  Administered 2023-09-26: 10 mg via RECTAL
  Filled 2023-09-26 (×2): qty 1

## 2023-09-26 MED ORDER — IOHEXOL 350 MG/ML SOLN
75.0000 mL | Freq: Once | INTRAVENOUS | Status: AC | PRN
  Administered 2023-09-26: 75 mL via INTRAVENOUS

## 2023-09-26 NOTE — Progress Notes (Addendum)
 PROGRESS NOTE    Christian [name]  NWG:[id number] DOB: 1945-10-26 DOA: 08/30/2023 PCP: [name], MD   Brief Narrative:  78 year old male with history of CAD status post stenting and CABG, postop CVA, chronic kidney disease stage IV, hyperlipidemia, hypertension underwent laparoscopic appendectomy by general surgery on 08/31/2023 followed by postoperative ileus requiring NG tube with hospital course complicated by acute kidney injury.  Nephrology was consulted and patient was started on hemodialysis.  He also developed paroxysmal A-fib with RVR: Cardiology was consulted.  He is currently on amiodarone  and metoprolol .  Subsequently, condition has improved.  Ileus has resolved and patient is tolerating diet.  General surgery and cardiology signed off on 09/21/2023.  PT recommending SNF placement.  General surgery consulted on 09/25/2023 for persistent abdominal pain with leukocytosis and concern for ileus on abdominal x-ray.  Assessment & Plan:   Acute perforated appendicitis with abscess status post surgical intervention on 08/31/2023 followed by postop ileus -Patient completed 2 weeks course of Zosyn .  General surgery was involved and patient had JP drain and NG tube.  Subsequently condition has improved.  Ileus has resolved and patient is tolerating diet.  Off of antibiotics.  General surgery signed off on 09/21/2023: Outpatient follow-up with general surgery - Patient has not had a bowel movement since 09/18/2023: Stool softeners and laxatives ordered on 09/24/2023.  X-ray of abdomen revealed possible ileus.  -General surgery consulted again on 09/25/2023.  CT of the abdomen and pelvis pending.  Fever - Had a temperature spike of 101.7 on 09/23/2023.  Blood cultures performed: Negative so far.  Urine culture negative so far.  Chest x-ray showed no signs of infiltrates.  Catheterized UA showed pyuria and bacteriuria.  No temperature spikes since then.  WBC still elevated with elevated CRP.  Will  hold off on antibiotics for now.  General surgery following.  Follow CT of abdomen.  AKI on CKD stage IV with concerns for ATN/possible new ESRD Acute metabolic acidosis - Nephrology following.  Dialysis as per nephrology schedule and recommendations.  Will need outpatient hemodialysis unit arranged prior to discharge - Will need outpatient follow-up with vascular surgery regarding AV fistula  Acute blood loss anemia -Required 1 unit packed red cell transfusion during this hospitalization.  Hemoglobin stable currently.  Monitor  Hyponatremia - Mild.  Monitor.  Managed by nephrology by hemodialysis.  Leukocytosis -Monitor.  Still significantly elevated at 15.9 on 09/25/2023.  Labs pending today  Paroxysmal A-fib with RVR - Currently rate controlled.  Continue oral amiodarone  and metoprolol : Decreased dose of metoprolol  to 25 mg twice a day on 09/25/2023 because of hypotension.  Outpatient follow-up with cardiology.  Cardiology signed off on 09/21/2023  Essential hypertension Hypotension - Blood pressure intermittently on the lower side.  Started midodrine  on 09/23/2023.  Continue oral metoprolol  as discussed above  Diabetes mellitus type 2 - Continue CBGs with SSI  CAD with history of stenting and CABG - Stable.  Outpatient follow-up with cardiologist  Hypokalemia - Resolved.  Hyperphosphatemia - Managed by nephrology  Hypoalbuminemia - Follow nutrition recommendations   DVT prophylaxis: Heparin  subcutaneous Code Status: Full Family Communication: Called wife on phone.  She did not pick up.  Disposition Plan: Status is: Inpatient Remains inpatient appropriate because: Of severity of illness.  Need for SNF placement.    Consultants: General Surgery/PCCM/nephrology/vascular surgery/cardiology/ID  Procedures: As above  Antimicrobials:  Anti-infectives (From admission, onward)    Start     Dose/Rate Route Frequency Ordered Stop   09/16/23 1300  ceFAZolin  (ANCEF ) IVPB  2g/100 mL premix        2 g 200 mL/hr over 30 Minutes Intravenous  Once 09/16/23 1255 09/16/23 1347   09/13/23 2000  ampicillin -sulbactam (UNASYN ) 1.5 g in sodium chloride  0.9 % 100 mL IVPB  Status:  Discontinued        1.5 g 200 mL/hr over 30 Minutes Intravenous Every 12 hours 09/13/23 1817 09/15/23 0943   08/31/23 2200  piperacillin -tazobactam (ZOSYN ) IVPB 2.25 g  Status:  Discontinued        2.25 g 100 mL/hr over 30 Minutes Intravenous Every 8 hours 08/31/23 1953 09/13/23 0858   08/31/23 0600  piperacillin -tazobactam (ZOSYN ) IVPB 3.375 g  Status:  Discontinued        3.375 g 100 mL/hr over 30 Minutes Intravenous Every 8 hours 08/31/23 0532 08/31/23 1953   08/30/23 2245  piperacillin -tazobactam (ZOSYN ) IVPB 3.375 g        3.375 g 100 mL/hr over 30 Minutes Intravenous  Once 08/30/23 2233 08/31/23 0005        Subjective: Patient seen and examined at bedside.  Poor historian.  No agitation, seizures, fever or vomiting reported.  Continues to have intermittent abdominal pain.  Objective: Vitals:   09/26/23 0023 09/26/23 0500 09/26/23 0504 09/26/23 0813  BP: (!) 98/53  (!) 104/58 (!) 100/58  Pulse: 92  96 86  Resp: 16  16   Temp: 98.1 F (36.7 C)  98 F (36.7 C)   TempSrc: Oral     SpO2: 98%  99%   Weight:  77.1 kg    Height:        Intake/Output Summary (Last 24 hours) at 09/26/2023 0847 Last data filed at 09/25/2023 1824 Gross per 24 hour  Intake 0 ml  Output 700 ml  Net -700 ml    Filed Weights   09/25/23 0806 09/25/23 1212 09/26/23 0500  Weight: 77.7 kg 77 kg 77.1 kg    Examination:  General: Remains on room air and in no distress.  Chronically ill and deconditioned looking ENT/neck: No obvious neck masses or elevated JVD noted  respiratory: Bilateral decreased breath sounds at bases with scattered crackles CVS: Rate currently controlled; S1 and S2 are heard abdominal: Soft, slightly tender and distended; no organomegaly, sounds sluggish  extremities: Trace  lower extremity edema present; no cyanosis CNS: Awake severe slow to respond and a poor historian.  No obvious focal deficits noted  lymph: No lymphadenopathy palpable skin: No obvious petechiae/rashes  psych: Showing no signs of agitation.  Currently has a flat affect musculoskeletal: No obvious joint deformity/tenderness    Data Reviewed: I have personally reviewed following labs and imaging studies  CBC: Recent Labs  Lab 09/23/23 0415 09/25/23 0522  WBC 14.6* 15.9*  NEUTROABS 10.2* 10.8*  HGB 8.0* 9.1*  HCT 25.5* 28.0*  MCV 101.6* 100.4*  PLT 410* 418*   Basic Metabolic Panel: Recent Labs  Lab 09/20/23 0758 09/21/23 0219 09/23/23 0415 09/23/23 1304 09/24/23 0625 09/25/23 0522 09/25/23 0523 09/26/23 0625  NA 135   < > 136 134* 132* 132* 131* 134*  K 4.1   < > 4.1 4.4 4.2 4.5 4.5 4.2  CL 95*   < > 94* 92* 94* 91* 91* 92*  CO2 23   < > 23 20* 20* 22 22 23   GLUCOSE 160*   < > 88 86 92 98 99 90  BUN 48*   < > 50* 22 42* 61* 61* 36*  CREATININE 5.53*   < >  6.59* 3.51* 5.11* 6.83* 6.74* 4.74*  CALCIUM  8.9   < > 9.3 9.2 9.5 9.0 9.0 9.3  MG 1.9  --  2.0  --   --  2.2  --   --   PHOS 6.6*   < > 7.0* 4.6 7.0*  --  7.4* 4.9*   < > = values in this interval not displayed.   GFR: Estimated Creatinine Clearance: 13.3 mL/min (A) (by C-G formula based on SCr of 4.74 mg/dL (H)). Liver Function Tests: Recent Labs  Lab 09/20/23 0758 09/21/23 0219 09/23/23 0415 09/23/23 1304 09/24/23 0625 09/25/23 0522 09/25/23 0523 09/26/23 0625  AST 30  --  29  --   --  36  --   --   ALT 21  --  18  --   --  22  --   --   ALKPHOS 107  --  101  --   --  98  --   --   BILITOT 0.9  --  0.6  --   --  0.9  --   --   PROT 7.0  --  8.0  --   --  8.2*  --   --   ALBUMIN  2.4*   < > 2.3* 2.5* 2.5* 2.4* 2.4* 2.3*   < > = values in this interval not displayed.   No results for input(s): "LIPASE", "AMYLASE" in the last 168 hours. No results for input(s): "AMMONIA" in the last 168  hours. Coagulation Profile: Recent Labs  Lab 09/26/23 0625  INR 1.1   Cardiac Enzymes: No results for input(s): "CKTOTAL", "CKMB", "CKMBINDEX", "TROPONINI" in the last 168 hours. BNP (last 3 results) No results for input(s): "PROBNP" in the last 8760 hours. HbA1C: No results for input(s): "HGBA1C" in the last 72 hours. CBG: Recent Labs  Lab 09/24/23 1705 09/24/23 2137 09/25/23 1415 09/25/23 1633 09/25/23 2112  GLUCAP 123* 99 117* 95 98   Lipid Profile: No results for input(s): "CHOL", "HDL", "LDLCALC", "TRIG", "CHOLHDL", "LDLDIRECT" in the last 72 hours.  Thyroid  Function Tests: No results for input(s): "TSH", "T4TOTAL", "FREET4", "T3FREE", "THYROIDAB" in the last 72 hours. Anemia Panel: No results for input(s): "VITAMINB12", "FOLATE", "FERRITIN", "TIBC", "IRON", "RETICCTPCT" in the last 72 hours. Sepsis Labs: No results for input(s): "PROCALCITON", "LATICACIDVEN" in the last 168 hours.  Recent Results (from the past 240 hours)  Culture, Urine (Do not remove urinary catheter, catheter placed by urology or difficult to place)     Status: None   Collection Time: 09/23/23 12:34 PM   Specimen: Urine, Catheterized  Result Value Ref Range Status   Specimen Description URINE, CATHETERIZED  Final   Special Requests NONE  Final   Culture   Final    NO GROWTH Performed at [facility], [address], [place], [place] [address]    Report Status 09/25/2023 FINAL  Final  Culture, blood (Routine X 2) w Reflex to ID Panel     Status: None (Preliminary result)   Collection Time: 09/23/23  1:04 PM   Specimen: BLOOD  Result Value Ref Range Status   Specimen Description BLOOD SITE NOT SPECIFIED  Final   Special Requests   Final    BOTTLES DRAWN AEROBIC ONLY Blood Culture results may not be optimal due to an inadequate volume of blood received in culture bottles   Culture   Final    NO GROWTH 3 DAYS Performed at [facility], [address], [place], [place] [address]  Report Status PENDING  Incomplete  Culture, blood (Routine X 2) w Reflex to ID Panel     Status: None (Preliminary result)   Collection Time: 09/23/23  1:04 PM   Specimen: BLOOD RIGHT HAND  Result Value Ref Range Status   Specimen Description BLOOD RIGHT HAND  Final   Special Requests   Final    BOTTLES DRAWN AEROBIC ONLY Blood Culture results may not be optimal due to an inadequate volume of blood received in culture bottles   Culture   Final    NO GROWTH 3 DAYS Performed at [facility], [address], [place], [place] [address]    Report Status PENDING  Incomplete         Radiology Studies: DG Abd 1 View Result Date: 09/24/2023 CLINICAL DATA:  Abdominal pain. EXAM: ABDOMEN - 1 VIEW COMPARISON:  09/17/2023 FINDINGS: Decreasing gaseous small bowel distension. Increased air throughout nondilated small bowel loops persists in the central abdomen. Small volume of formed stool in the colon. No evidence of free air in the supine views. IMPRESSION: Decreasing gaseous small bowel distension. Persistent increased air throughout nondilated small bowel loops in the central abdomen may represent ileus. Electronically Signed   By: Chadwick Colonel M.D.   On: 09/24/2023 15:58        Scheduled Meds:  calcium  acetate  667 mg Oral TID WC   Chlorhexidine  Gluconate Cloth  6 each Topical Q0600   darbepoetin (ARANESP ) injection - DIALYSIS  100 mcg Subcutaneous Q Thu-1800   feeding supplement  237 mL Oral BID BM   fluticasone  furoate-vilanterol  1 puff Inhalation Daily   heparin  injection (subcutaneous)  5,000 Units Subcutaneous Q8H   insulin  aspart  0-20 Units Subcutaneous TID WC   metoprolol  tartrate  25 mg Oral BID   midodrine   10 mg Oral TID WC   multivitamin  1 tablet Oral QHS   senna-docusate  1 tablet Oral BID   sodium chloride  flush  3 mL Intravenous Q12H   thiamine   100 mg Oral Daily   Continuous Infusions:        Audria Leather, MD [facility] 09/26/2023, 8:47  AM

## 2023-09-26 NOTE — Progress Notes (Signed)
 09/26/2023  Christian [name] [id number] 25-Dec-1945  CARE TEAM: PCP: [name], MD  Outpatient Care Team: Patient Care Team: [name], MD as PCP - General (Internal Medicine) Knox Perl, MD as PCP - Cardiology (Cardiology) Ruffin Cotton, DPM as Consulting Physician (Podiatry) McKenzie, Arden Beck, MD as Consulting Physician (Urology) Darien Eden, Johnny Nanas, MD as Referring Physician (Cardiology) Emory Harps (Neurology)  Inpatient Treatment Team: Treatment Team:  Audria Leather, MD Chucky Craver, MD Everette Hives, MD Ccs, Md, MD Katherine Pancake, RN Gogovi, Rosaline E, NT Boris Byars, RN Horton Maas, RN Kathaleen Pale, RN   Problem List:   Principal Problem:   Appendicitis Active Problems:   HTN (hypertension)   Type II diabetes mellitus   CAD (coronary artery disease)   Ischemic cardiomyopathy   S/P CABG x 4   Acute renal failure superimposed on stage 4 chronic kidney disease (HCC)   ABLA (acute blood loss anemia)   Paroxysmal atrial flutter (HCC)   Pure hypercholesterolemia   09/16/2023  Procedure(s): INSERTION OF DIALYSIS CATHETER REMOVAL OF TEMPORARY TUNNELED HEMODIALYSIS CATHETER ULTRASOUND GUIDANCE, FOR VASCULAR ACCESS    Assessment [facility] Stay = 26 days) 10 Days Post-Op    Anxious and sore.  Concern for postop abscess in the setting of bad appendicitis 3 weeks ago    Plan POD 26 - s/p lap appy with drain placement for perforated appendicitis [name] 5/6  -Completed postoperative Zosyn  5/21 -Surgical drain removed 5/21 - Concern for worsening pain and ileus.   - Worsening leukocytosis. - CAT scan not done yet.  Already night nurse radiology tech came up and noted patient was confused so did not do CAT scan.  I found nursing this morning and nursing team and stressed that he needs it STAT now.  I changed the order to reflect that. Ideally drinks some oral contrast for a well contrasted CAT scan  since he is having more distention and discomfort.  Do not know if that happened yet.  Need to rule out abscess.  I highly suspect that.  That may be explaining his worsening pain and confusion.  They will call in the radiology department.        FEN -n.p.o. for now until CAT scan done.  May need NG tube if he feels worse or CAT scan raises concern for obstruction.  We will see.   Lines/tubes - tunneled HD cath 5/22 VVS, PIV x2 RUE, LUE w/ AV Fistula (non-functional) VTE - SQ heparin  ID - Zosyn  d/c 5/2.  Trying to follow off antibiotics first to see if there is really an abscess or if this is just ileus/obstruction or other concern.       I updated the patient's status to the patient.  Very anxious but partially consolable.  Discussed with nursing team as noted above.  Recommendations were made.  Questions were answered.     -Disposition: ??     I reviewed nursing notes, Consultant nephrology notes, hospitalist notes, last 24 h vitals and pain scores, last 48 h intake and output, last 24 h labs and trends, and last 24 h imaging results.  I have reviewed this patient's available data, including medical history, events of note, test results, etc as part of my evaluation.   A significant portion of that time was spent in counseling. Care during the described time interval was provided by me.  This care required high  level of medical decision making.  09/26/2023  Subjective: (Chief complaint)  Patient nervous.  Complaining of back pain.  Abdomen feeling uncomfortable.  No nausea or vomiting.  CAT scan not done yet.  Objective:  Vital signs:  Vitals:   09/25/23 2110 09/26/23 0023 09/26/23 0500 09/26/23 0504  BP: (!) 103/57 (!) 98/53  (!) 104/58  Pulse: 91 92  96  Resp: 16 16  16   Temp: 97.7 F (36.5 C) 98.1 F (36.7 C)  98 F (36.7 C)  TempSrc: Oral Oral    SpO2: 98% 98%  99%  Weight:   77.1 kg   Height:        Last BM Date : 09/18/23  Intake/Output    Yesterday:  05/31 0701 - 06/01 0700 In: 0  Out: 700  This shift:  No intake/output data recorded.  Bowel function:  Flatus: No  BM:  No  Drain: (No drain)   Physical Exam:  General: Pt awake/alert in mild acute distress Eyes: PERRL, normal EOM.  Sclera clear.  No icterus Neuro: CN II-XII intact w/o focal sensory/motor deficits. Lymph: No head/neck/groin lymphadenopathy Psych: Somewhat masked face but when talking with him he becomes anxious and tearful this morning.  Mostly consolable.   . No delerium/psychosis/paranoia.  Oriented x 3 HENT: Normocephalic, Mucus membranes moist.  No thrush Neck: Supple, No tracheal deviation.  No obvious thyromegaly Chest: No pain to chest wall compression.  Good respiratory excursion.  No audible wheezing CV:  Pulses intact.  Regular rhythm.  No major extremity edema MS: Normal AROM mjr joints.  No obvious deformity  Abdomen: Somewhat firm.  Moderately distended.  Tenderness in right lower quadrant greater than left lower quadrant but no guarding.  Incisions closed with no cellulitis drainage or hernia.  No evidence of peritonitis.  No incarcerated hernias.  Ext:  No deformity.  No mjr edema.  No cyanosis Skin: No petechiae / purpurea.  No major sores.  Warm and dry    Results:   Cultures: Recent Results (from the past 720 hours)  Culture, blood (Routine X 2) w Reflex to ID Panel     Status: None   Collection Time: 09/02/23  7:52 AM   Specimen: BLOOD RIGHT ARM  Result Value Ref Range Status   Specimen Description BLOOD RIGHT ARM  Final   Special Requests   Final    BOTTLES DRAWN AEROBIC AND ANAEROBIC Blood Culture adequate volume   Culture   Final    NO GROWTH 5 DAYS Performed at [facility], [address], [place], [place] [address]    Report Status 09/07/2023 FINAL  Final  Culture, blood (Routine X 2) w Reflex to ID Panel     Status: None   Collection Time: 09/02/23  7:52 AM   Specimen: BLOOD RIGHT ARM  Result Value  Ref Range Status   Specimen Description BLOOD RIGHT ARM  Final   Special Requests   Final    BOTTLES DRAWN AEROBIC AND ANAEROBIC Blood Culture adequate volume   Culture   Final    NO GROWTH 5 DAYS Performed at [facility], [address] [place], [place] [address]    Report Status 09/07/2023 FINAL  Final  Culture, blood (Routine X 2) w Reflex to ID Panel     Status: None   Collection Time: 09/13/23 10:11 AM   Specimen: BLOOD RIGHT HAND  Result Value Ref Range Status   Specimen Description BLOOD RIGHT HAND  Final   Special Requests   Final  BOTTLES DRAWN AEROBIC AND ANAEROBIC Blood Culture adequate volume   Culture   Final    NO GROWTH 5 DAYS Performed at [facility], [address], [place], [place] [address]    Report Status 09/18/2023 FINAL  Final  Culture, blood (Routine X 2) w Reflex to ID Panel     Status: None   Collection Time: 09/13/23 10:11 AM   Specimen: BLOOD RIGHT HAND  Result Value Ref Range Status   Specimen Description BLOOD RIGHT HAND  Final   Special Requests   Final    BOTTLES DRAWN AEROBIC AND ANAEROBIC Blood Culture adequate volume   Culture   Final    NO GROWTH 5 DAYS Performed at [facility], [address] [place], [place] [address]    Report Status 09/18/2023 FINAL  Final  Culture, Urine (Do not remove urinary catheter, catheter placed by urology or difficult to place)     Status: None   Collection Time: 09/23/23 12:34 PM   Specimen: Urine, Catheterized  Result Value Ref Range Status   Specimen Description URINE, CATHETERIZED  Final   Special Requests NONE  Final   Culture   Final    NO GROWTH Performed at [facility], [address], [place], [place] [address]    Report Status 09/25/2023 FINAL  Final  Culture, blood (Routine X 2) w Reflex to ID Panel     Status: None (Preliminary result)   Collection Time: 09/23/23  1:04 PM   Specimen: BLOOD  Result Value Ref Range Status   Specimen Description BLOOD SITE NOT  SPECIFIED  Final   Special Requests   Final    BOTTLES DRAWN AEROBIC ONLY Blood Culture results may not be optimal due to an inadequate volume of blood received in culture bottles   Culture   Final    NO GROWTH 2 DAYS Performed at [facility], [address], [place], [place] [address]    Report Status PENDING  Incomplete  Culture, blood (Routine X 2) w Reflex to ID Panel     Status: None (Preliminary result)   Collection Time: 09/23/23  1:04 PM   Specimen: BLOOD RIGHT HAND  Result Value Ref Range Status   Specimen Description BLOOD RIGHT HAND  Final   Special Requests   Final    BOTTLES DRAWN AEROBIC ONLY Blood Culture results may not be optimal due to an inadequate volume of blood received in culture bottles   Culture   Final    NO GROWTH 2 DAYS Performed at [facility], [address], [place], [place] [address]    Report Status PENDING  Incomplete    Labs: Results for orders placed or performed during the hospital encounter of 08/30/23 (from the past 48 hours)  Glucose, capillary     Status: None   Collection Time: 09/24/23  7:57 AM  Result Value Ref Range   Glucose-Capillary 97 70 - 99 mg/dL    Comment: Glucose reference range applies only to samples taken after fasting for at least 8 hours.  Glucose, capillary     Status: Abnormal   Collection Time: 09/24/23 11:55 AM  Result Value Ref Range   Glucose-Capillary 105 (H) 70 - 99 mg/dL    Comment: Glucose reference range applies only to samples taken after fasting for at least 8 hours.  Glucose, capillary     Status: Abnormal   Collection Time: 09/24/23  5:05 PM  Result Value Ref Range  Glucose-Capillary 123 (H) 70 - 99 mg/dL    Comment: Glucose reference range applies only to samples taken after fasting for at least 8 hours.  Glucose, capillary     Status: None   Collection Time: 09/24/23  9:37 PM  Result Value Ref Range   Glucose-Capillary 99 70 - 99 mg/dL    Comment: Glucose reference range applies only  to samples taken after fasting for at least 8 hours.  CBC with Differential/Platelet     Status: Abnormal   Collection Time: 09/25/23  5:22 AM  Result Value Ref Range   WBC 15.9 (H) 4.0 - 10.5 K/uL   RBC 2.79 (L) 4.22 - 5.81 MIL/uL   Hemoglobin 9.1 (L) 13.0 - 17.0 g/dL   HCT 34.7 (L) 42.5 - 95.6 %   MCV 100.4 (H) 80.0 - 100.0 fL   MCH 32.6 26.0 - 34.0 pg   MCHC 32.5 30.0 - 36.0 g/dL   RDW 38.7 (H) 56.4 - 33.2 %   Platelets 418 (H) 150 - 400 K/uL   nRBC 0.2 0.0 - 0.2 %   Neutrophils Relative % 68 %   Neutro Abs 10.8 (H) 1.7 - 7.7 K/uL   Lymphocytes Relative 10 %   Lymphs Abs 1.7 0.7 - 4.0 K/uL   Monocytes Relative 11 %   Monocytes Absolute 1.7 (H) 0.1 - 1.0 K/uL   Eosinophils Relative 1 %   Eosinophils Absolute 0.1 0.0 - 0.5 K/uL   Basophils Relative 1 %   Basophils Absolute 0.2 (H) 0.0 - 0.1 K/uL   WBC Morphology Mild Left Shift (1-5% metas, occ myelo)    RBC Morphology See Note    Smear Review Normal platelet morphology    Immature Granulocytes 9 %   Abs Immature Granulocytes 1.47 (H) 0.00 - 0.07 K/uL   Polychromasia PRESENT     Comment: Performed at [facility], [address], [place], [place] [address]  Comprehensive metabolic panel with GFR     Status: Abnormal   Collection Time: 09/25/23  5:22 AM  Result Value Ref Range   Sodium 132 (L) 135 - 145 mmol/L   Potassium 4.5 3.5 - 5.1 mmol/L   Chloride 91 (L) 98 - 111 mmol/L   CO2 22 22 - 32 mmol/L   Glucose, Bld 98 70 - 99 mg/dL    Comment: Glucose reference range applies only to samples taken after fasting for at least 8 hours.   BUN 61 (H) 8 - 23 mg/dL   Creatinine, Ser 4.16 (H) 0.61 - 1.24 mg/dL   Calcium  9.0 8.9 - 10.3 mg/dL   Total Protein 8.2 (H) 6.5 - 8.1 g/dL   Albumin  2.4 (L) 3.5 - 5.0 g/dL   AST 36 15 - 41 U/L   ALT 22 0 - 44 U/L   Alkaline Phosphatase 98 38 - 126 U/L   Total Bilirubin 0.9 0.0 - 1.2 mg/dL   GFR, Estimated 8 (L) >60 mL/min    Comment: (NOTE) Calculated using the CKD-EPI Creatinine  Equation (2021)    Anion gap 19 (H) 5 - 15    Comment: Performed at [facility], [address], [place], [place] [address]  Magnesium      Status: None   Collection Time: 09/25/23  5:22 AM  Result Value Ref Range   Magnesium  2.2 1.7 - 2.4 mg/dL    Comment: Performed at [facility], [address] [place], [place] [address]  C-reactive protein     Status:  Abnormal   Collection Time: 09/25/23  5:22 AM  Result Value Ref Range   CRP 39.3 (H) <1.0 mg/dL    Comment: Performed at [facility], [address], [place], [place] [address]  Renal function panel     Status: Abnormal   Collection Time: 09/25/23  5:23 AM  Result Value Ref Range   Sodium 131 (L) 135 - 145 mmol/L   Potassium 4.5 3.5 - 5.1 mmol/L   Chloride 91 (L) 98 - 111 mmol/L   CO2 22 22 - 32 mmol/L   Glucose, Bld 99 70 - 99 mg/dL    Comment: Glucose reference range applies only to samples taken after fasting for at least 8 hours.   BUN 61 (H) 8 - 23 mg/dL   Creatinine, Ser 6.04 (H) 0.61 - 1.24 mg/dL   Calcium  9.0 8.9 - 10.3 mg/dL   Phosphorus 7.4 (H) 2.5 - 4.6 mg/dL   Albumin  2.4 (L) 3.5 - 5.0 g/dL   GFR, Estimated 8 (L) >60 mL/min    Comment: (NOTE) Calculated using the CKD-EPI Creatinine Equation (2021)    Anion gap 18 (H) 5 - 15    Comment: Performed at [facility], [address] [place], [place] [address]  Glucose, capillary     Status: Abnormal   Collection Time: 09/25/23  2:15 PM  Result Value Ref Range   Glucose-Capillary 117 (H) 70 - 99 mg/dL    Comment: Glucose reference range applies only to samples taken after fasting for at least 8 hours.  Glucose, capillary     Status: None   Collection Time: 09/25/23  4:33 PM  Result Value Ref Range   Glucose-Capillary 95 70 - 99 mg/dL    Comment: Glucose reference range applies only to samples taken after fasting for at least 8 hours.  Glucose, capillary     Status: None   Collection Time: 09/25/23  9:12 PM  Result Value Ref Range    Glucose-Capillary 98 70 - 99 mg/dL    Comment: Glucose reference range applies only to samples taken after fasting for at least 8 hours.    Imaging / Studies: DG Abd 1 View Result Date: 09/24/2023 CLINICAL DATA:  Abdominal pain. EXAM: ABDOMEN - 1 VIEW COMPARISON:  09/17/2023 FINDINGS: Decreasing gaseous small bowel distension. Increased air throughout nondilated small bowel loops persists in the central abdomen. Small volume of formed stool in the colon. No evidence of free air in the supine views. IMPRESSION: Decreasing gaseous small bowel distension. Persistent increased air throughout nondilated small bowel loops in the central abdomen may represent ileus. Electronically Signed   By: Chadwick Colonel M.D.   On: 09/24/2023 15:58    Medications / Allergies: per chart  Antibiotics: Anti-infectives (From admission, onward)    Start     Dose/Rate Route Frequency Ordered Stop   09/16/23 1300  ceFAZolin  (ANCEF ) IVPB 2g/100 mL premix        2 g 200 mL/hr over 30 Minutes Intravenous  Once 09/16/23 1255 09/16/23 1347   09/13/23 2000  ampicillin -sulbactam (UNASYN ) 1.5 g in sodium chloride  0.9 % 100 mL IVPB  Status:  Discontinued        1.5 g 200 mL/hr over 30 Minutes Intravenous Every 12 hours 09/13/23 1817 09/15/23 0943   08/31/23 2200  piperacillin -tazobactam (ZOSYN ) IVPB 2.25 g  Status:  Discontinued        2.25 g 100 mL/hr over 30 Minutes Intravenous Every 8 hours 08/31/23 1953 09/13/23 0858  08/31/23 0600  piperacillin -tazobactam (ZOSYN ) IVPB 3.375 g  Status:  Discontinued        3.375 g 100 mL/hr over 30 Minutes Intravenous Every 8 hours 08/31/23 0532 08/31/23 1953   08/30/23 2245  piperacillin -tazobactam (ZOSYN ) IVPB 3.375 g        3.375 g 100 mL/hr over 30 Minutes Intravenous  Once 08/30/23 2233 08/31/23 0005         Note: Portions of this report may have been transcribed using voice recognition software. Every effort was made to ensure accuracy; however, inadvertent  computerized transcription errors may be present.   Any transcriptional errors that result from this process are unintentional.    Eddye Goodie, MD, FACS, MASCRS Esophageal, Gastrointestinal & Colorectal Surgery Robotic and Minimally Invasive Surgery  [facility] [address] [place], [place] [address] [phone] Fax [phone] Main  CONTACT INFORMATION: Weekday (9AM-5PM): Call [facility] main office at [phone] Weeknight (5PM-9AM) or Weekend/Holiday: Check EPIC "Web Links" tab & use "AMION" (password " [email or web address]") for General Surgery [facility] coverage  Please, DO NOT use SecureChat  (it is not reliable communication to reach operating surgeons & will lead to a delay in care).   Epic staff messaging available for outptient concerns needing 1-2 business day response.      09/26/2023  7:42 AM

## 2023-09-26 NOTE — Progress Notes (Addendum)
 IV blew but noncontrast CAT scan done.  I can see fluid collection in right lower quadrant and something in the pelvis as well.  I think he would benefit from drain placement of these locations.  Lower one probably have to be transgluteal.  I have placed an order to have  interventional radiology evaluate and place drains tomorrow Monday.  Restart antibiotics.  Given his history of pansensitive Pseudomonas 2021 would start with Zosyn .  Typical intra-abdominal coverage in the setting of PATOS (present at time of surgery)/peritonitis.  Definitely would check abscesses for culture to make sure he is not developing any MDR drug-resistant or atypical or fungal organisms

## 2023-09-26 NOTE — Progress Notes (Signed)
 Per Dr Hershell Lose called CT [phone] and spoke with Alisa App, let her know pt needs CT STAT. Was supposed to be done last night. Pt is becoming more confused and may have an abscess so that's why its urgent for it to be done. Alisa App verbalized understanding.

## 2023-09-26 NOTE — Progress Notes (Signed)
 [facility] Progress Note   Assessment/ Plan:    Expand All Collapse All  [facility] NEPHROLOGY PROGRESS NOTE   Assessment/ Plan: Pt is a 78 y.o. yo male with CAD status post CABG, underwent laparoscopic appendectomy on 5/7 developed postop ileus, AKI on CKD.   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  UA with pyuria and kidney ultrasound without hydronephrosis. No sign of renal recovery with worsening azotemia. First HD on 5/14, second HD on 5/15.  Received third HD 5/17 with 1.5 L UF, tolerated well.  Continues on TTS schedule.  Ended up getting into trouble fluid wise with delayed HD 5/22- got in middle of the night urgently-  then routine HD on 5/24-  TTS schedule while here CLIP in process- came in the chair and tolerated rx Thursday Has MWF OP spot- will convert to MWF.   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  On 5/14, the AV fistula is unable to cannulate even with small needle 17G.   TDC  with [facility]VS, 5/22. [facility] to follow as an outpatient regarding his AV fistula   # Appendicitis with possible abscess and postop ileus: s/p  antibiotics and drain per surgical team.     # Acute blood loss anemia, CT angio negative for bleeding.  Transfuse as needed.  Started ESA 5/22.  No IV iron until infectious issues clearly resolved.     # CKD-MBD, hyperphosphatemia: Phosphorus stable and with poor p.o. is off sevelamer , PTH level 337, continue to monitor.   # Hypokalemia: Managed with HD, monitor lab.    Subjective:    In CT.  Note reviewed- looks like another fluid collection, plan for a drain possibly   Objective:   BP (!) 100/58   Pulse 86   Temp 98 F (36.7 C)   Resp 16   Ht 5\' 9"  (1.753 m)   Wt 77.1 kg   SpO2 99%    BMI 25.10 kg/m   Physical Exam: Gen:NAD CVS:RRR Resp:clear, muffled at bases Abd: soft. Slightly distended ZOX:WRUEA LE edema ACCESS: TDC and L AVF + T/B but has infiltration  Labs: BMET Recent Labs  Lab 09/21/23 0219 09/22/23 0309 09/23/23 0415 09/23/23 1304 09/24/23 5409 09/25/23 0522 09/25/23 0523 09/26/23 0625  NA 136 136 136 134* 132* 132* 131* 134*  K 3.9 4.0 4.1 4.4 4.2 4.5 4.5 4.2  CL 95* 97* 94* 92* 94* 91* 91* 92*  CO2 25 25 23  20* 20* 22 22 23   GLUCOSE 119* 94 88 86 92 98 99 90  BUN 55* 30* 50* 22 42* 61* 61* 36*  CREATININE 6.64* 4.64* 6.59* 3.51* 5.11* 6.83* 6.74* 4.74*  CALCIUM  9.0 9.0 9.3 9.2 9.5 9.0 9.0 9.3  PHOS 6.8* 5.4* 7.0* 4.6 7.0*  --  7.4* 4.9*   CBC Recent Labs  Lab 09/23/23 0415 09/25/23 0522  WBC 14.6* 15.9*  NEUTROABS 10.2* 10.8*  HGB 8.0* 9.1*  HCT 25.5* 28.0*  MCV 101.6* 100.4*  PLT 410* 418*      Medications:     acetaminophen   1,000 mg Oral TID WC   bisacodyl   10 mg Rectal Daily   calcium  acetate  667 mg Oral TID WC   Chlorhexidine  Gluconate Cloth  6 each Topical Q0600   darbepoetin (  ARANESP ) injection - DIALYSIS  100 mcg Subcutaneous Q Thu-1800   feeding supplement  237 mL Oral BID BM   fluticasone  furoate-vilanterol  1 puff Inhalation Daily   heparin  injection (subcutaneous)  5,000 Units Subcutaneous Q8H   insulin  aspart  0-20 Units Subcutaneous TID WC   metoprolol  tartrate  25 mg Oral BID   midodrine   10 mg Oral TID WC   multivitamin  1 tablet Oral QHS   polyethylene glycol  17 g Oral BID   senna-docusate  1 tablet Oral BID   sodium chloride  flush  3 mL Intravenous Q12H   thiamine   100 mg Oral Daily     Leandra Pro MD 09/26/2023, 9:59 AM

## 2023-09-26 NOTE — Progress Notes (Signed)
Pt transferred to CT via bed by transportation staff

## 2023-09-26 NOTE — Consult Note (Addendum)
 Chief Complaint: Patient was seen in consultation today for  Chief Complaint  Patient presents with   Abdominal Pain   Referring Physician(s): Dr. Hershell Lose   Supervising Physician: Elene Griffes  Patient Status: [facility] - In-pt  History of Present Illness: Christian [name] is a 78 y.o. male with a medical history significant for CAD s/p CABG, stroke following CABG, heart failure, BPH, prior renal failure requiring a brief period HD, HTN and DM2. He presented to the ED 08/31/23 with RLQ pain and was found to have perforated appendicitis without abscess. He was taken to the OR the same day and underwent laparoscopic appendectomy with [name]. A JP drain was also placed during the procedure.    His recovery was initially complicated by persistent leukocytosis, ileus and some blood loss requiring PRBCs. His kidney function worsened and HD was started. He was seen in IR for placement of a temporary HD catheter 09/08/23. CT imaging showed a hematoma in the RLQ. Due to low output the surgically placed JP drain was removed 09/15/23. He was taken to the OR with Vascular Surgery 09/16/23 for placement of a tunneled HD catheter.   He improved clinically and surgery signed off on him 09/20/23. Unfortunately, the patient developed worsening leukocytosis with increased abdominal pain. Surgery was re-consulted and a CT scan was ordered. This showed multiple intra-abdominal abscesses.   CT abdomen/pelvis 09/26/23 IMPRESSION: 1. Interval removal of surgical drain. 2. 3.1 x 2.5 cm focal fluid collection anterior right pelvis. Although relatively well-defined, there is not substantial peripheral enhancement. This collection does appear to communicate with a 5.7 x 2.0 cm rim enhancing complex fluid collection in the upper right pelvis in the region of the appendectomy bed. This larger rim enhancing collection corresponds to the location of the 7.2 x 5.3 cm complex collection seen on the previous exam. Tiny  gas bubbles are contained within the rim enhancing fluid collection. Imaging features are compatible with abscess. Bowel leak not entirely excluded. 3. Fluid collection in the rectovesical pouch with adjacent peritoneal enhancement. Phlegmon/Evolving abscess a concern. 4. Layering sludge or stones in the gallbladder. 5. Left colonic diverticulosis without diverticulitis. 6. Bibasilar collapse/consolidation with probable focus of rounded atelectasis in the left lower lobe, similar to prior. Moderate right and small left pleural effusion similar to prior. 7.  Aortic Atherosclerosis (ICD10-I70.0).  Interventional Radiology has been asked to evaluate this patient for image-guided intra-abdominal abscess aspirations/drain placements. Imaging reviewed and procedure approved by Dr. Julietta Ogren.   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   Diabetic polyneuropathy    Elevated liver enzymes 08/09/2019   Hiatal hernia    History of coronary artery stent placement    History of pulmonary embolism 08/09/2019   HTN (hypertension) 11/12/2009   Hyperlipidemia associated with type 2 diabetes mellitus 11/12/2009   Left leg weakness 11/23/2018   Mild dementia, 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 LIMA TO LAD, RIMA TO PDA, RADIAL ARTERY TO CIRC AND SEQUENTIALLY TO OM1.   Sleep difficulties 05/30/2020   Type II diabetes mellitus  10/10/2014    Past Surgical History:  Procedure Laterality Date   A/V FISTULAGRAM Left 08/12/2022   Procedure: A/V  Fistulagram;  Surgeon: Kayla Part, MD;  Location[facility] INVASIVE CV LAB;  Service: Cardiovascular;  Laterality: Left;   A/V FISTULAGRAM N/A 09/07/2023   Procedure: A/V Fistulagram;  Surgeon: Margherita Shell, MD;  Location: [facility] INVASIVE CV LAB;  Service: Cardiovascular;  Laterality: N/A;   APPLICATION OF WOUND VAC N/A 08/24/2019   Procedure: APPLICATION OF WOUND VAC;  Surgeon: Rudine Cos, MD;  Location: [facility] OR;  Service: Thoracic;  Laterality: N/A;   APPLICATION OF WOUND VAC  08/29/2019   Procedure: Wound Vac change;  Surgeon: Rudine Cos, MD;  Location: [facility] OR;  Service: Open Heart Surgery;;   APPLICATION OF WOUND VAC N/A 09/04/2019   Procedure: Application Of Wound Vac;  Surgeon: Norita Beauvais, MD;  Location: [facility] OR;  Service: Open Heart Surgery;  Laterality: N/A;   APPLICATION OF WOUND VAC N/A 09/06/2019   Procedure: APPLICATION OF ACELL, APPLICATION OF WOUND VAC USING PREVENA INCISIONAL  DRESSING;  Surgeon: Thornell Flirt, DO;  Location: [facility] OR;  Service: Plastics;  Laterality: N/A;   AV FISTULA PLACEMENT Left 09/18/2020   Procedure: ARTERIOVENOUS (AV) FISTULA CREATION LEFT VERSUS GRAFT;  Surgeon: Margherita Shell, MD;  Location: [facility] OR;  Service: Vascular;  Laterality: Left;   BASCILIC VEIN TRANSPOSITION Left 10/31/2020   Procedure: LEFT SECOND STAGE BASCILIC VEIN TRANSPOSITION;  Surgeon: Margherita Shell, MD;  Location: [facility] OR;  Service: Vascular;  Laterality: Left;   CARDIAC CATHETERIZATION     CORONARY ARTERY BYPASS GRAFT N/A 08/15/2019   Procedure: CORONARY ARTERY BYPASS GRAFTING (CABG), x 4 using bilateral IMAs and left radial artery .  LIMA TO LAD, RIMA TO PDA, RADIAL ARTERY TO CIRC AND SEQUENTIALLY TO OM1.;  Surgeon: Rudine Cos, MD;  Location: [facility] OR;  Service: Open Heart Surgery;  Laterality: N/A;   CORONARY STENT PLACEMENT  02/27/2014   distal rt/pd coronary       dr Berry [place]   CYSTO/ BLADDER BIOPSY'S/ CAUTHERIZATION  01-14-2004  DR Isla Mari   EXPLORATION POST  OPERATIVE OPEN HEART N/A 08/16/2019   Procedure: Chest Closure S?P CABG WITH APPLICATION OF PREVENA  INCISIONAL WOUND VAC;  Surgeon: Rudine Cos, MD;  Location: [facility] OR;  Service: Open Heart Surgery;  Laterality: N/A;   EXPLORATION POST OPERATIVE OPEN HEART N/A 08/21/2019   Procedure: CHEST WASHOUT S/P OPEN CHEST;  Surgeon: Rudine Cos, MD;  Location: [facility] OR;  Service: Open Heart Surgery;  Laterality: N/A;  Open chest with Esmark dressing with Ioban sealant coverage.   EXPLORATION POST OPERATIVE OPEN HEART N/A 08/18/2019   Procedure: EXPLORATION POST OPERATIVE OPEN HEART (performed 04/23 on unit);  Surgeon: Rudine Cos, MD;  Location: [facility] OR;  Service: Open Heart Surgery;  Laterality: N/A;   EXPLORATION POST OPERATIVE OPEN HEART N/A 08/24/2019   Procedure: CHEST WASHOUT POST OPERATIVE OPEN HEART;  Surgeon: Rudine Cos, MD;  [facility]: [facility] OR;  Service: Open Heart Surgery;  Laterality: N/A;   EXPLORATION POST OPERATIVE OPEN HEART N/A 08/29/2019   Procedure: CHEST WOUND WASHOUT POST OPERATIVE OPEN HEART;  Surgeon: Rudine Cos, MD;  [facility]: [facility] OR;  Service: Open Heart Surgery;  Laterality: N/A;   EXPLORATION POST OPERATIVE OPEN HEART N/A 09/04/2019   Procedure: MEDIASTINAL EXPLORATION WITH STERNAL WOUND IRRIGATION;  Surgeon: Norita Beauvais, MD;  Location: [facility] OR;  Service: Open Heart Surgery;  Laterality: N/A;  EXPLORATION POST OPERATIVE OPEN HEART N/A 09/14/2019   Procedure: EVACUATION OF HEMATOMA;  Surgeon: Norita Beauvais, MD;  Location: [facility] OR;  Service: Open Heart Surgery;  Laterality: N/A;   INSERTION OF DIALYSIS CATHETER N/A 09/16/2023   Procedure: INSERTION OF DIALYSIS CATHETER;  Surgeon: Young Hensen, MD;  Location: [facility] OR;  Service: Vascular;  Laterality: N/A;  TUNNELED DIALYSIS CATHETER   IR FLUORO GUIDE CV LINE LEFT  09/19/2019   IR FLUORO GUIDE CV LINE RIGHT  09/08/2023   IR GASTROSTOMY TUBE MOD SED  10/04/2019   IR GASTROSTOMY TUBE REMOVAL  11/29/2019   IR  PATIENT EVAL TECH 0-60 MINS  09/29/2019   IR US  GUIDE VASC ACCESS LEFT  09/19/2019   IR US  GUIDE VASC ACCESS RIGHT  09/08/2023   LAPAROSCOPIC APPENDECTOMY N/A 08/31/2023   Procedure: APPENDECTOMY, LAPAROSCOPIC;  Surgeon: Anda Bamberg, MD;  Location: [facility] OR;  Service: General;  Laterality: N/A;   LAPAROSCOPIC LYSIS OF ADHESIONS N/A 09/06/2019   Procedure: LAPAROSCOPIC OMENTAL HARVEST;  Surgeon: Derral Flick, MD;  Location: [facility] OR;  Service: General;  Laterality: N/A;   LEFT HEART CATH AND CORONARY ANGIOGRAPHY N/A 08/10/2019   Procedure: LEFT HEART CATH AND CORONARY ANGIOGRAPHY;  Surgeon: Cody Das, MD;  [facility]: [facility] INVASIVE CV LAB;  Service: Cardiovascular;  Laterality: N/A;   LEFT HEART CATHETERIZATION WITH CORONARY ANGIOGRAM N/A 02/27/2014   Procedure: LEFT HEART CATHETERIZATION WITH CORONARY ANGIOGRAM;  Surgeon: Jessica Morn, MD;  Location: [facility] CATH LAB;  Service: Cardiovascular;  Laterality: N/A;   MEDIASTINAL EXPLORATION N/A 09/06/2019   Procedure: MEDIASTINAL EXPLORATION;  Surgeon: Norita Beauvais, MD;  Location: [facility] OR;  Service: Thoracic;  Laterality: N/A;   PECTORALIS FLAP  09/06/2019   Procedure: Pectoralis ADVANCEMENT Flap;  Surgeon: Thornell Flirt, DO;  Location: [facility] OR;  Service: Plastics;;   PERCUTANEOUS CORONARY STENT INTERVENTION (PCI-S)  02/27/2014   Procedure: PERCUTANEOUS CORONARY STENT INTERVENTION (PCI-S);  Surgeon: Jessica Morn, MD;  Location: [facility] CATH LAB;  Service: Cardiovascular;;  rt PDA  3.0/28mm Promus stent   PERIPHERAL VASCULAR INTERVENTION Left 08/12/2022   Procedure: PERIPHERAL VASCULAR INTERVENTION;  Surgeon: Kayla Part, MD;  Location: [facility] INVASIVE CV LAB;  Service: Cardiovascular;  Laterality: Left;   RADIAL ARTERY HARVEST Left 08/15/2019   Procedure: Radial Artery Harvest;  Surgeon: Rudine Cos, MD;  Location: [facility] OR;  Service: Open Heart Surgery;  Laterality: Left;   REMOVAL OF A HERO DEVICE Right 09/16/2023   Procedure:  REMOVAL OF TEMPORARY TUNNELED HEMODIALYSIS CATHETER;  Surgeon: Young Hensen, MD;  Location: [facility] OR;  Service: Vascular;  Laterality: Right;   RIB PLATING N/A 09/06/2019   Procedure: STERNAL PLATING;  Surgeon: Norita Beauvais, MD;  Location: [facility] OR;  Service: Thoracic;  Laterality: N/A;   TEE WITHOUT CARDIOVERSION N/A 08/15/2019   Procedure: TRANSESOPHAGEAL ECHOCARDIOGRAM (TEE);  Surgeon: Rudine Cos, MD;  Location: [facility] OR;  Service: Open Heart Surgery;  Laterality: N/A;   TRACHEOSTOMY TUBE PLACEMENT  08/29/2019   Procedure: Tracheostomy;  Surgeon: Rudine Cos, MD;  Location: [facility] OR;  Service: Open Heart Surgery; Decannulated 6/10/20211   TRANSURETHRAL RESECTION OF PROSTATE  04/04/2012   Procedure: TRANSURETHRAL RESECTION OF THE PROSTATE WITH GYRUS INSTRUMENTS;  Surgeon: Edmund Gouge, MD;  Location: [facility];  Service: Urology;  Laterality: N/A;   TRANSURETHRAL RESECTION OF PROSTATE N/A 09/27/2014   Procedure: TRANSURETHRAL RESECTION OF THE PROSTATE ;  Surgeon: Annamarie Kid, MD;  Location: [facility] ORS;  Service: Urology;  Laterality: N/A;   TUNNELLED CATHETER EXCHANGE N/A 09/07/2023   Procedure: TUNNELLED CATHETER EXCHANGE;  Surgeon: Margherita Shell, MD;  Location: [facility] INVASIVE CV LAB;  Service: Cardiovascular;  Laterality: N/A;   ULTRASOUND GUIDANCE FOR VASCULAR ACCESS Right 09/16/2023   Procedure: ULTRASOUND GUIDANCE, FOR VASCULAR ACCESS;  Surgeon: Young Hensen, MD;  Location: [facility] OR;  Service: Vascular;  Laterality: Right;   UPPER GASTROINTESTINAL ENDOSCOPY      Allergies: Patient has no known allergies.  Medications: Prior to Admission medications   Medication Sig Start Date End Date Taking? Authorizing Provider  acetaminophen  (TYLENOL ) 325 MG tablet Take 650 mg by mouth every 6 (six) hours as needed.   Yes [provider]  acetaminophen  (TYLENOL ) 500 MG tablet Take 2 tablets (1,000 mg total) by mouth 4 (four) times daily. 08/31/23 08/30/24  Yes Anda Bamberg, MD  albuterol  (VENTOLIN  HFA) 108 (90 Base) MCG/ACT inhaler TAKE 2 PUFFS BY MOUTH EVERY 6 HOURS AS NEEDED FOR WHEEZE OR SHORTNESS OF BREATH 04/22/21  Yes [name], MD  atorvastatin  (LIPITOR) 20 MG tablet Take 1 tablet (20 mg total) by mouth every evening. Patient taking differently: Take 20 mg by mouth daily after supper. 08/24/23  Yes Knox Perl, MD  budesonide -formoterol  (SYMBICORT ) 160-4.5 MCG/ACT inhaler INHALE 2 PUFFS INTO THE LUNGS TWICE A DAY 12/09/22  Yes Wilfredo Hanly, MD  calcitRIOL (ROCALTROL) 0.25 MCG capsule Take 0.25 mcg by mouth 3 (three) times a week. Take one capsule at dinner after on Mon-Wed-Fri. 07/21/23  Yes [provider]  clonazePAM  (KLONOPIN ) 0.5 MG disintegrating tablet TAKE 1 TABLET BY MOUTH TWICE A DAY 03/15/23  Yes [name], MD  clopidogrel  (PLAVIX ) 75 MG tablet Take 1 tablet (75 mg total) by mouth daily. 06/24/23  Yes Knox Perl, MD  cromolyn  (OPTICROM ) 4 % ophthalmic solution Place 1 drop into both eyes every Wednesday. At bedtime 12/08/19  Yes [provider]  docusate sodium  (COLACE) 100 MG capsule Take 1 capsule (100 mg total) by mouth 2 (two) times daily. 08/31/23 08/30/24 Yes Anda Bamberg, MD  ferrous sulfate  325 (65 FE) MG tablet Take 325 mg by mouth daily with breakfast.   Yes [provider]  furosemide  (LASIX ) 80 MG tablet Take 120 mg by mouth 2 (two) times daily. Take one and one-half tablet in the morning at 0800 and 1400 daily 11/30/20  Yes [provider]  glimepiride  (AMARYL ) 2 MG tablet TAKE 1 TABLET BY MOUTH DAILY BEFORE BREAKFAST. 08/09/23  Yes [name], MD  hydrALAZINE  (APRESOLINE ) 100 MG tablet TAKE 1 TABLET BY MOUTH 3 TIMES DAILY. 10/12/22  Yes Knox Perl, MD  isosorbide  dinitrate (ISORDIL ) 30 MG tablet TAKE 1 TABLET BY MOUTH 3 TIMES DAILY. 05/03/23  Yes [name], MD  methocarbamol  (ROBAXIN -750) 750 MG tablet Take 1 tablet (750 mg total) by mouth 4  (four) times daily. 08/31/23  Yes Lovick, Bard Boor, MD  metolazone  (ZAROXOLYN ) 5 MG tablet Take 5 mg by mouth 3 (three) times a week. Take one tablet by mouth on Sun-Tues-Thurs before each dose of Furosemide .   Yes [provider]  metoprolol  succinate (TOPROL -XL) 50 MG 24 hr tablet Take 3 tablets (150 mg total) by mouth daily. Take with or immediately following a meal. 05/11/23  Yes Knox Perl, MD  Multiple Vitamin (MULTIVITAMIN) tablet Take 1 tablet by mouth daily.   Yes [provider]  nitroGLYCERIN  (NITROSTAT ) 0.4 MG SL tablet Place 0.4 mg under the  tongue every 5 (five) minutes as needed for chest pain. 10/25/19  Yes [provider]  oxyCODONE  (ROXICODONE ) 5 MG immediate release tablet Take 1 tablet (5 mg total) by mouth every 4 (four) hours as needed. 08/31/23  Yes Anda Bamberg, MD  potassium chloride  (KLOR-CON ) 10 MEQ tablet Take 30 mEq by mouth daily. 10/25/20  Yes [provider]  PREVIDENT  5000 BOOSTER PLUS 1.1 % PSTE TAKE 1 APPLICATION BY MOUTH EVERY EVENING. 09/14/22  Yes [name], MD  senna-docusate (SENOKOT-S) 8.6-50 MG tablet Take 1 tablet by mouth at bedtime as needed for mild constipation.   Yes [provider]  sertraline  (ZOLOFT ) 25 MG tablet TAKE 1 TABLET BY MOUTH EVERY DAY 10/26/22  Yes [name], MD  silodosin (RAPAFLO) 8 MG CAPS capsule Take 1 capsule (8 mg total) by mouth at bedtime. 09/25/21  Yes McKenzie, Arden Beck, MD     Family History  Problem Relation Age of Onset   Diabetes Mother    Hypertension Mother    Diabetes Father    Diabetes Brother    Hypertension Brother    Bone cancer Brother    Diabetes Brother     Social History   Socioeconomic History   Marital status: Married    Spouse name: Not on file   Number of children: 2   Years of education: 14   Highest education level: Associate degree: academic program  Occupational History   Occupation: Retired  Tobacco Use   Smoking  status: Former    Current packs/day: 0.00    Average packs/day: 0.5 packs/day for 20.0 years (10.0 ttl pk-yrs)    Types: Cigarettes    Start date: 89    Quit date: 1975    Years since quitting: 50.4   Smokeless tobacco: Never  Vaping Use   Vaping status: Never Used  Substance and Sexual Activity   Alcohol use: Not Currently    Comment: very rarely - every now and then 1 drink/1 year if that   Drug use: No   Sexual activity: Not Currently  Other Topics Concern   Not on file  Social History Narrative   Right handed   Dirnks caffeine prn   Married   Lives with wife   Social Drivers of Health   Financial Resource Strain: Low Risk  (07/23/2021)   Overall Financial Resource Strain (CARDIA)    Difficulty of Paying Living Expenses: Not hard at all  Food Insecurity: No Food Insecurity (08/31/2023)   Hunger Vital Sign    Worried About Running Out of Food in the Last Year: Never true    Ran Out of Food in the Last Year: Never true  Transportation Needs: No Transportation Needs (08/31/2023)   PRAPARE - Administrator, Civil Service (Medical): No    Lack of Transportation (Non-Medical): No  Physical Activity: Inactive (07/23/2021)   Exercise Vital Sign    Days of Exercise per Week: 0 days    Minutes of Exercise per Session: 0 min  Stress: No Stress Concern Present (07/23/2021)   [employer] of Occupational Health - Occupational Stress Questionnaire    Feeling of Stress : Not at all  Social Connections: Moderately Integrated (08/31/2023)   Social Connection and Isolation Panel [NHANES]    Frequency of Communication with Friends and Family: Once a week    Frequency of Social Gatherings with Friends and Family: Never    Attends Religious Services: 1 to 4 times per year  Active Member of Clubs or Organizations: Yes    Attends Banker Meetings: 1 to 4 times per year    Marital Status: Married    Review of Systems: A 12 point ROS discussed and pertinent  positives are indicated in the HPI above.  All other systems are negative.  Review of Systems  Constitutional:  Positive for fatigue.  Respiratory:  Positive for shortness of breath.   Gastrointestinal:  Positive for abdominal pain.  All other systems reviewed and are negative.   Vital Signs: BP (!) 104/58   Pulse 90   Temp 97.9 F (36.6 C) (Oral)   Resp 17   Ht 5\' 9"  (1.753 m)   Wt 169 lb 15.6 oz (77.1 kg)   SpO2 (!) 88%   BMI 25.10 kg/m   Physical Exam Constitutional:      General: He is not in acute distress.    Appearance: He is not ill-appearing.  HENT:     Mouth/Throat:     Mouth: Mucous membranes are moist.     Pharynx: Oropharynx is clear.  Cardiovascular:     Rate and Rhythm: Normal rate.     Comments: Right internal jugular tunneled dialysis catheter.  Pulmonary:     Effort: Pulmonary effort is normal.  Abdominal:     Tenderness: There is abdominal tenderness.  Genitourinary:    Comments: Foley catheter  Skin:    General: Skin is warm and dry.  Neurological:     Mental Status: He is alert and oriented to person, place, and time.     Imaging: CT ABDOMEN PELVIS W CONTRAST Result Date: 09/26/2023 CLINICAL DATA:  Postop abdominal pain.  Evaluate for abscess. EXAM: CT ABDOMEN AND PELVIS WITH CONTRAST TECHNIQUE: Multidetector CT imaging of the abdomen and pelvis was performed using the standard protocol following bolus administration of intravenous contrast. RADIATION DOSE REDUCTION: This exam was performed according to the departmental dose-optimization program which includes automated exposure control, adjustment of the mA and/or kV according to patient size and/or use of iterative reconstruction technique. COMPARISON:  09/13/2023 FINDINGS: Lower chest: Bibasilar collapse/consolidation with probable focus of rounded atelectasis in the left lower lobe, similar to prior. Moderate right and small left pleural effusion similar to prior. Hepatobiliary: No suspicious  focal abnormality within the liver parenchyma. Layering sludge or stones seen in the gallbladder. No intrahepatic or extrahepatic biliary dilation. Pancreas: No focal mass lesion. No dilatation of the main duct. No intraparenchymal cyst. No peripancreatic edema. Spleen: Small subcapsular lesion posterior spleen is stable, nonspecific but likely benign. Adrenals/Urinary Tract: Bilateral adrenal thickening again noted. Simple left renal cyst is stable. Tiny well-defined homogeneous low-density lesions in both kidneys are too small to characterize but are statistically most likely benign and probably cysts. No followup imaging is recommended. No evidence for hydroureter. Bladder is decompressed by Foley catheter. Stomach/Bowel: Stomach is unremarkable. No gastric wall thickening. No evidence of outlet obstruction. Duodenum is normally positioned as is the ligament of Treitz. No small bowel obstruction. Terminal ileum courses through a region of inflammatory change in the right lower quadrant. Patient is status post appendectomy. No gross colonic mass. No colonic wall thickening. Diverticular changes are noted in the left colon without evidence of diverticulitis. Vascular/Lymphatic: There is moderate atherosclerotic calcification of the abdominal aorta without aneurysm. There is no gastrohepatic or hepatoduodenal ligament lymphadenopathy. No retroperitoneal or mesenteric lymphadenopathy. No pelvic sidewall lymphadenopathy. Reproductive: The prostate gland and seminal vesicles are unremarkable. Other: Surgical drain seen on the previous study has  been removed in the interval. 3.1 x 2.5 cm focal fluid collection identified anterior right pelvis on image 67/3. Although relatively well-defined, there is not substantial peripheral enhancement. A second 5.7 x 2.0 cm rim enhancing complex fluid collection is identified in the upper right pelvis in the region of the appendectomy bed. This corresponds to the location of the 7.2  x 5.3 cm complex collection seen on the previous exam. Tiny gas bubbles are seen in the non dependent region of the collection (images 53 and 55 of series 3). This collection does appear to communicate with the collection described in the paragraph above on image 66 of series 3. Free fluid is identified in the rectovesical pouch with apparent enhancement of the peritoneum. Mesenteric edema/inflammation noted in the right lower quadrant corresponding to the region of surgery. Musculoskeletal: Small gas bubbles in the subcutaneous fat of the anterior right abdominal wall likely reflects an injection site. No worrisome lytic or sclerotic osseous abnormality. IMPRESSION: 1. Interval removal of surgical drain. 2. 3.1 x 2.5 cm focal fluid collection anterior right pelvis. Although relatively well-defined, there is not substantial peripheral enhancement. This collection does appear to communicate with a 5.7 x 2.0 cm rim enhancing complex fluid collection in the upper right pelvis in the region of the appendectomy bed. This larger rim enhancing collection corresponds to the location of the 7.2 x 5.3 cm complex collection seen on the previous exam. Tiny gas bubbles are contained within the rim enhancing fluid collection. Imaging features are compatible with abscess. Bowel leak not entirely excluded. 3. Fluid collection in the rectovesical pouch with adjacent peritoneal enhancement. Phlegmon/Evolving abscess a concern. 4. Layering sludge or stones in the gallbladder. 5. Left colonic diverticulosis without diverticulitis. 6. Bibasilar collapse/consolidation with probable focus of rounded atelectasis in the left lower lobe, similar to prior. Moderate right and small left pleural effusion similar to prior. 7.  Aortic Atherosclerosis (ICD10-I70.0). Electronically Signed   By: Donnal Fusi M.D.   On: 09/26/2023 09:37   Labs:  CBC: Recent Labs    09/16/23 2249 09/17/23 0141 09/18/23 0235 09/23/23 0415 09/25/23 0522   WBC 13.4*  --  13.2* 14.6* 15.9*  HGB 8.6* 7.8* 8.0* 8.0* 9.1*  HCT 27.2* 23.0* 25.2* 25.5* 28.0*  PLT 317  --  277 410* 418*    COAGS: Recent Labs    09/26/23 0625  INR 1.1    BMP: Recent Labs    09/24/23 0625 09/25/23 0522 09/25/23 0523 09/26/23 0625  NA 132* 132* 131* 134*  K 4.2 4.5 4.5 4.2  CL 94* 91* 91* 92*  CO2 20* 22 22 23   GLUCOSE 92 98 99 90  BUN 42* 61* 61* 36*  CALCIUM  9.5 9.0 9.0 9.3  CREATININE 5.11* 6.83* 6.74* 4.74*  GFRNONAA 11* 8* 8* 12*    LIVER FUNCTION TESTS: Recent Labs    09/18/23 0235 09/19/23 0225 09/20/23 0758 09/21/23 0219 09/23/23 0415 09/23/23 1304 09/24/23 0625 09/25/23 0522 09/25/23 0523 09/26/23 0625  BILITOT 0.7  --  0.9  --  0.6  --   --  0.9  --   --   AST 47*  --  30  --  29  --   --  36  --   --   ALT 39  --  21  --  18  --   --  22  --   --   ALKPHOS 99  --  107  --  101  --   --  98  --   --  PROT 6.7  --  7.0  --  8.0  --   --  8.2*  --   --   ALBUMIN  2.4*  2.4*   < > 2.4*   < > 2.3*   < > 2.5* 2.4* 2.4* 2.3*   < > = values in this interval not displayed.    TUMOR MARKERS: No results for input(s): "AFPTM", "CEA", "CA199", "CHROMGRNA" in the last 8760 hours.  Assessment and Plan:  Perforated appendicitis s/p laparoscopic appendectomy; recovery complicated by intra-abdominal fluid collections: [name], 78 year old male, is tentatively scheduled 09/27/23 for an image-guided intra-abdominal fluid collection aspiration with possible drain placements. The procedure was discussed with the patient, his wife and his son.   Risks and benefits discussed with the patient including bleeding, infection, damage to adjacent structures, bowel perforation/fistula connection, and sepsis.  All of the patient's questions were answered, patient is agreeable to proceed. He will be NPO at midnight. CBC and PT/INR ordered for the morning. Subcutaneous heparin  will be held two doses.   Consent signed and in chart.  Thank you  for this interesting consult.  I greatly enjoyed meeting DIJUAN SLEETH and look forward to participating in their care.  A copy of this report was sent to the requesting provider on this date.  Electronically Signed: Jetta Morrow, AGACNP-BC 09/26/2023, 11:56 AM   I spent a total of 20 Minutes    in face to face in clinical consultation, greater than 50% of which was counseling/coordinating care for intra-abdominal fluid collections.

## 2023-09-26 NOTE — Progress Notes (Signed)
 Pt arrived back to [address] 02 from CT alert. Bed in lowest position. Call light in reach. Bed alarm on

## 2023-09-26 NOTE — Progress Notes (Signed)
 wife Felipa Horsfall requesting a call from MD [phone] Alekh,MD made aware

## 2023-09-26 NOTE — Progress Notes (Signed)
 Placed STAT IV team consult for STAT CT per Darlynn Elam in CT stated while getting scan with contrast the iv blew and were unable to get the scan. we believe most of the contrast went into the arm (75ml). We applied a heat pack to the area and would need a new iv placed prior to getting the repeat scan done. We checked to see if we could find a vein but were unsuccessful. We did send him back upstairs for either you or iv team to look.

## 2023-09-27 ENCOUNTER — Inpatient Hospital Stay (HOSPITAL_COMMUNITY)

## 2023-09-27 DIAGNOSIS — K37 Unspecified appendicitis: Secondary | ICD-10-CM | POA: Diagnosis not present

## 2023-09-27 LAB — RENAL FUNCTION PANEL
Albumin: 2.1 g/dL — ABNORMAL LOW (ref 3.5–5.0)
Anion gap: 14 (ref 5–15)
BUN: 49 mg/dL — ABNORMAL HIGH (ref 8–23)
CO2: 24 mmol/L (ref 22–32)
Calcium: 8.7 mg/dL — ABNORMAL LOW (ref 8.9–10.3)
Chloride: 93 mmol/L — ABNORMAL LOW (ref 98–111)
Creatinine, Ser: 6.87 mg/dL — ABNORMAL HIGH (ref 0.61–1.24)
GFR, Estimated: 8 mL/min — ABNORMAL LOW (ref >60)
Glucose, Bld: 115 mg/dL — ABNORMAL HIGH (ref 70–99)
Phosphorus: 5.3 mg/dL — ABNORMAL HIGH (ref 2.5–4.6)
Potassium: 4.4 mmol/L (ref 3.5–5.1)
Sodium: 131 mmol/L — ABNORMAL LOW (ref 135–145)

## 2023-09-27 LAB — CBC
HCT: 26.3 % — ABNORMAL LOW (ref 39.0–52.0)
Hemoglobin: 8.1 g/dL — ABNORMAL LOW (ref 13.0–17.0)
MCH: 31.8 pg (ref 26.0–34.0)
MCHC: 30.8 g/dL (ref 30.0–36.0)
MCV: 103.1 fL — ABNORMAL HIGH (ref 80.0–100.0)
Platelets: 375 10*3/uL (ref 150–400)
RBC: 2.55 MIL/uL — ABNORMAL LOW (ref 4.22–5.81)
RDW: 16.9 % — ABNORMAL HIGH (ref 11.5–15.5)
WBC: 15.7 10*3/uL — ABNORMAL HIGH (ref 4.0–10.5)
nRBC: 0.2 % (ref 0.0–0.2)

## 2023-09-27 LAB — GLUCOSE, CAPILLARY
Glucose-Capillary: 125 mg/dL — ABNORMAL HIGH (ref 70–99)
Glucose-Capillary: 141 mg/dL — ABNORMAL HIGH (ref 70–99)
Glucose-Capillary: 100 mg/dL — ABNORMAL HIGH (ref 70–99)

## 2023-09-27 MED ORDER — SODIUM CHLORIDE 0.9 % IV BOLUS
250.0000 mL | Freq: Once | INTRAVENOUS | Status: AC
  Administered 2023-09-27: 250 mL via INTRAVENOUS

## 2023-09-27 MED ORDER — LIDOCAINE HCL 1 % IJ SOLN
10.0000 mL | Freq: Once | INTRAMUSCULAR | Status: AC
  Administered 2023-09-27: 10 mL via INTRADERMAL
  Filled 2023-09-27: qty 10

## 2023-09-27 MED ORDER — MIDAZOLAM HCL 2 MG/2ML IJ SOLN
INTRAMUSCULAR | Status: AC
  Filled 2023-09-27: qty 4

## 2023-09-27 MED ORDER — MIDODRINE HCL 5 MG PO TABS
ORAL_TABLET | ORAL | Status: AC
Start: 2023-09-27 — End: ?
  Filled 2023-09-27: qty 2

## 2023-09-27 MED ORDER — FENTANYL CITRATE (PF) 100 MCG/2ML IJ SOLN
INTRAMUSCULAR | Status: AC | PRN
  Administered 2023-09-27 (×3): 25 ug via INTRAVENOUS

## 2023-09-27 MED ORDER — FENTANYL CITRATE (PF) 100 MCG/2ML IJ SOLN
INTRAMUSCULAR | Status: AC
  Filled 2023-09-27: qty 2

## 2023-09-27 MED ORDER — MIDAZOLAM HCL 2 MG/2ML IJ SOLN
INTRAMUSCULAR | Status: AC | PRN
  Administered 2023-09-27 (×3): .5 mg via INTRAVENOUS

## 2023-09-27 MED ORDER — SODIUM CHLORIDE 0.9% FLUSH
5.0000 mL | Freq: Three times a day (TID) | INTRAVENOUS | Status: DC
  Administered 2023-09-27 – 2023-09-30 (×8): 5 mL

## 2023-09-27 MED ORDER — SODIUM CHLORIDE 0.9% FLUSH
5.0000 mL | Freq: Three times a day (TID) | INTRAVENOUS | Status: DC
  Administered 2023-09-28 – 2023-09-29 (×3): 5 mL

## 2023-09-27 NOTE — Progress Notes (Signed)
 PT Cancellation Note  Patient Details Name: Christian [name] MRN: [id number] DOB: 21-Aug-1945   Cancelled Treatment:    Reason Eval/Treat Not Completed: (P) Patient at procedure or test/unavailable (Pt off floor at HD. Will follow up as able.)   [name] 09/27/2023, 10:35 AM

## 2023-09-27 NOTE — Progress Notes (Signed)
   09/27/23 1405  Vitals  Temp 97.9 F (36.6 C)  Pulse Rate 99  Resp (!) 31  BP (!) 91/58  SpO2 100 %  O2 Device Nasal Cannula  Weight 79 kg  Type of Weight Post-Dialysis  Oxygen Therapy  O2 Flow Rate (L/min) 1 L/min  Patient Activity (if Appropriate) In bed  Pulse Oximetry Type Continuous  Post Treatment  Dialyzer Clearance Lightly streaked  Hemodialysis Intake (mL) 0 mL  Liters Processed 70.6  Fluid Removed (mL) 700 mL  Tolerated HD Treatment Yes  Post-Hemodialysis Comments TX TERMINATED 21 MINS EARLY DUE TO HYPOTENSION PT REQUEST   Received patient in bed to unit.  Alert and oriented.  Informed consent signed and in chart.   TX duration:3HRS 9 MINS  Patient tolerated well.  Transported back to the room  Alert, without acute distress.  Hand-off given to patient's nurse.   Access used: [facility] Access issues: NONE  Total UF removed: Medication(s) given: MIDODRINE     [facility]

## 2023-09-27 NOTE — Progress Notes (Signed)
   09/27/23 1820  Assess: MEWS Score  Temp (!) 97.2 F (36.2 C)  BP (!) 77/58  MAP (mmHg) (!) 64  Pulse Rate 98  Resp 16  Level of Consciousness Alert  O2 Device Nasal Cannula  O2 Flow Rate (L/min) 3 L/min  Assess: MEWS Score  MEWS Temp 0  MEWS Systolic 2  MEWS Pulse 0  MEWS RR 0  MEWS LOC 0  MEWS Score 2  MEWS Score Color Yellow  Assess: if the MEWS score is Yellow or Red  Were vital signs accurate and taken at a resting state? Yes  Does the patient meet 2 or more of the SIRS criteria? No  Does the patient have a confirmed or suspected source of infection? No  MEWS guidelines implemented  Yes, yellow  Treat  MEWS Interventions Considered administering scheduled or prn medications/treatments as ordered  Take Vital Signs  Increase Vital Sign Frequency  Yellow: Q2hr x1, continue Q4hrs until patient remains green for 12hrs  Escalate  MEWS: Escalate Yellow: Discuss with charge nurse and consider notifying provider and/or RRT  Notify: Charge Nurse/RN  Name of Charge Nurse/RN Notified [facility]  Provider Notification  Provider Name/Title Sherran Dock  Date Provider Notified 09/27/23  Time Provider Notified 1822  Method of Notification Page  Notification Reason Other (Comment) (Yellow MEWS)  Provider response See new orders (250 NS bolus)  Date of Provider Response 09/27/23  Time of Provider Response 1837  Assess: SIRS CRITERIA  SIRS Temperature  0  SIRS Respirations  0  SIRS Pulse 1  SIRS WBC 0  SIRS Score Sum  1

## 2023-09-27 NOTE — TOC Progression Note (Signed)
 Transition of Care [facility]) - Progression Note    Patient Details  Name: Christian [name] MRN: [id number] Date of Birth: 1946-01-27  [facility] CM/SW Contact  Tandy Fam, [place] Phone Number: 09/27/2023, 2:42 PM  Clinical Narrative:  CSW coordinated with [facility] about bed availability, they could admit the patient today but patient not stable. Insurance authorization will run out tomorrow, may likely need new authorization if not stable tomorrow. CSW updated [facility]. CSW to follow.    Expected Discharge Plan: Skilled Nursing Facility Barriers to Discharge: Continued Medical Work up, English as a second language teacher  Expected Discharge Plan and Services In-house Referral: Clinical Social Work   Post Acute Care Choice: Skilled Nursing Facility Living arrangements for the past 2 months: Single Family Home                                       Social Determinants of Health (SDOH) Interventions SDOH Screenings   Food Insecurity: No Food Insecurity (08/31/2023)  Housing: Low Risk  (08/31/2023)  Transportation Needs: No Transportation Needs (08/31/2023)  Utilities: Not At Risk (08/31/2023)  Alcohol Screen: Low Risk  (07/23/2021)  Depression (PHQ2-9): Low Risk  (01/18/2023)  Financial Resource Strain: Low Risk  (07/23/2021)  Physical Activity: Inactive (07/23/2021)  Social Connections: Moderately Integrated (08/31/2023)  Stress: No Stress Concern Present (07/23/2021)  Tobacco Use: Medium Risk (09/16/2023)    Readmission Risk Interventions     No data to display

## 2023-09-27 NOTE — Sedation Documentation (Signed)
 SBAR given to Val in HD.

## 2023-09-27 NOTE — Progress Notes (Signed)
 11 Days Post-Op   Subjective/Chief Complaint: PT doing well IR drain placed this AM   Objective: Vital signs in last 24 hours: Temp:  [97.6 F (36.4 C)-99 F (37.2 C)] 97.6 F (36.4 C) (06/02 1010) Pulse Rate:  [88-104] 103 (06/02 1130) Resp:  [0-24] 19 (06/02 1130) BP: (80-106)/(50-63) 83/58 (06/02 1130) SpO2:  [25 %-100 %] 25 % (06/02 1130) Weight:  [79.7 kg] 79.7 kg (06/02 1010) Last BM Date : 09/18/23  Intake/Output from previous day: 06/01 0701 - 06/02 0700 In: 50 [IV Piggyback:50] Out: -  Intake/Output this shift: No intake/output data recorded.  PE:  Constitutional: No acute distress, conversant, appears states age. Eyes: Anicteric sclerae, moist conjunctiva, no lid lag Lungs: Clear to auscultation bilaterally, normal respiratory effort CV: regular rate and rhythm, no murmurs, no peripheral edema, pedal pulses 2+ GI: Soft, no masses or hepatosplenomegaly, non-tender to palpation, JP SS with some pus Skin: No rashes, palpation reveals normal turgor Psychiatric: appropriate judgment and insight, oriented to person, place, and time   Lab Results:  Recent Labs    09/25/23 0522 09/27/23 1045  WBC 15.9* 15.7*  HGB 9.1* 8.1*  HCT 28.0* 26.3*  PLT 418* 375   BMET Recent Labs    09/26/23 0625 09/27/23 1045  NA 134* 131*  K 4.2 4.4  CL 92* 93*  CO2 23 24  GLUCOSE 90 115*  BUN 36* 49*  CREATININE 4.74* 6.87*  CALCIUM  9.3 8.7*   PT/INR Recent Labs    09/26/23 0625  LABPROT 14.2  INR 1.1   ABG No results for input(s): "PHART", "HCO3" in the last 72 hours.  Invalid input(s): "PCO2", "PO2"  Studies/Results: CT GUIDED VISCERAL FLUID DRAIN BY [facility] CATH Result Date: 09/27/2023 Rosetta Cons, MD     09/27/2023 10:01 AM Pre procedural Dx: RLQ abscess Post procedural Dx: Same Technically successful CT guided placed of a 10 Fr drainage catheter placement into the RLQ abscess yielding 1mL heme/pus.  A representative aspirated sample was capped and sent to  the laboratory for analysis.  EBL: Trace Complications: None immediate Zettie Hillock, MD Pager #: [phone]    CT ABDOMEN PELVIS W CONTRAST Result Date: 09/26/2023 CLINICAL DATA:  Postop abdominal pain.  Evaluate for abscess. EXAM: CT ABDOMEN AND PELVIS WITH CONTRAST TECHNIQUE: Multidetector CT imaging of the abdomen and pelvis was performed using the standard protocol following bolus administration of intravenous contrast. RADIATION DOSE REDUCTION: This exam was performed according to the departmental dose-optimization program which includes automated exposure control, adjustment of the mA and/or kV according to patient size and/or use of iterative reconstruction technique. COMPARISON:  09/13/2023 FINDINGS: Lower chest: Bibasilar collapse/consolidation with probable focus of rounded atelectasis in the left lower lobe, similar to prior. Moderate right and small left pleural effusion similar to prior. Hepatobiliary: No suspicious focal abnormality within the liver parenchyma. Layering sludge or stones seen in the gallbladder. No intrahepatic or extrahepatic biliary dilation. Pancreas: No focal mass lesion. No dilatation of the main duct. No intraparenchymal cyst. No peripancreatic edema. Spleen: Small subcapsular lesion posterior spleen is stable, nonspecific but likely benign. Adrenals/Urinary Tract: Bilateral adrenal thickening again noted. Simple left renal cyst is stable. Tiny well-defined homogeneous low-density lesions in both kidneys are too small to characterize but are statistically most likely benign and probably cysts. No followup imaging is recommended. No evidence for hydroureter. Bladder is decompressed by Foley catheter. Stomach/Bowel: Stomach is unremarkable. No gastric wall thickening. No evidence of outlet obstruction. Duodenum is normally positioned as is the  ligament of Treitz. No small bowel obstruction. Terminal ileum courses through a region of inflammatory change in the right lower quadrant.  Patient is status post appendectomy. No gross colonic mass. No colonic wall thickening. Diverticular changes are noted in the left colon without evidence of diverticulitis. Vascular/Lymphatic: There is moderate atherosclerotic calcification of the abdominal aorta without aneurysm. There is no gastrohepatic or hepatoduodenal ligament lymphadenopathy. No retroperitoneal or mesenteric lymphadenopathy. No pelvic sidewall lymphadenopathy. Reproductive: The prostate gland and seminal vesicles are unremarkable. Other: Surgical drain seen on the previous study has been removed in the interval. 3.1 x 2.5 cm focal fluid collection identified anterior right pelvis on image 67/3. Although relatively well-defined, there is not substantial peripheral enhancement. A second 5.7 x 2.0 cm rim enhancing complex fluid collection is identified in the upper right pelvis in the region of the appendectomy bed. This corresponds to the location of the 7.2 x 5.3 cm complex collection seen on the previous exam. Tiny gas bubbles are seen in the non dependent region of the collection (images 53 and 55 of series 3). This collection does appear to communicate with the collection described in the paragraph above on image 66 of series 3. Free fluid is identified in the rectovesical pouch with apparent enhancement of the peritoneum. Mesenteric edema/inflammation noted in the right lower quadrant corresponding to the region of surgery. Musculoskeletal: Small gas bubbles in the subcutaneous fat of the anterior right abdominal wall likely reflects an injection site. No worrisome lytic or sclerotic osseous abnormality. IMPRESSION: 1. Interval removal of surgical drain. 2. 3.1 x 2.5 cm focal fluid collection anterior right pelvis. Although relatively well-defined, there is not substantial peripheral enhancement. This collection does appear to communicate with a 5.7 x 2.0 cm rim enhancing complex fluid collection in the upper right pelvis in the region of  the appendectomy bed. This larger rim enhancing collection corresponds to the location of the 7.2 x 5.3 cm complex collection seen on the previous exam. Tiny gas bubbles are contained within the rim enhancing fluid collection. Imaging features are compatible with abscess. Bowel leak not entirely excluded. 3. Fluid collection in the rectovesical pouch with adjacent peritoneal enhancement. Phlegmon/Evolving abscess a concern. 4. Layering sludge or stones in the gallbladder. 5. Left colonic diverticulosis without diverticulitis. 6. Bibasilar collapse/consolidation with probable focus of rounded atelectasis in the left lower lobe, similar to prior. Moderate right and small left pleural effusion similar to prior. 7.  Aortic Atherosclerosis (ICD10-I70.0). Electronically Signed   By: Donnal Fusi M.D.   On: 09/26/2023 09:37    Anti-infectives: Anti-infectives (From admission, onward)    Start     Dose/Rate Route Frequency Ordered Stop   09/26/23 1030  piperacillin -tazobactam (ZOSYN ) IVPB 2.25 g       Note to Pharmacy: Pharmacy may adjust dosing strength, schedule, rate of infusion, etc as needed to optimize therapy   2.25 g 100 mL/hr over 30 Minutes Intravenous Every 8 hours 09/26/23 0942 10/01/23 1359   09/16/23 1300  ceFAZolin  (ANCEF ) IVPB 2g/100 mL premix        2 g 200 mL/hr over 30 Minutes Intravenous  Once 09/16/23 1255 09/16/23 1347   09/13/23 2000  ampicillin -sulbactam (UNASYN ) 1.5 g in sodium chloride  0.9 % 100 mL IVPB  Status:  Discontinued        1.5 g 200 mL/hr over 30 Minutes Intravenous Every 12 hours 09/13/23 1817 09/15/23 0943   08/31/23 2200  piperacillin -tazobactam (ZOSYN ) IVPB 2.25 g  Status:  Discontinued  2.25 g 100 mL/hr over 30 Minutes Intravenous Every 8 hours 08/31/23 1953 09/13/23 0858   08/31/23 0600  piperacillin -tazobactam (ZOSYN ) IVPB 3.375 g  Status:  Discontinued        3.375 g 100 mL/hr over 30 Minutes Intravenous Every 8 hours 08/31/23 0532 08/31/23 1953    08/30/23 2245  piperacillin -tazobactam (ZOSYN ) IVPB 3.375 g        3.375 g 100 mL/hr over 30 Minutes Intravenous  Once 08/30/23 2233 08/31/23 0005       Assessment/Plan: POD 27 - s/p lap appy with drain placement for perforated appendicitis [name] 5/6  -Completed postoperative Zosyn  5/21 -IR drain placed this AM -Con't abx         FEN -OK for diet ad lib  Lines/tubes - tunneled HD cath 5/22 VVS, PIV x2 RUE, LUE w/ AV Fistula (non-functional) VTE - SQ heparin  ID - Zosyn    LOS: 27 days    Shela Derby 09/27/2023

## 2023-09-27 NOTE — Progress Notes (Signed)
Pt transported to radiology via bed by transportation staff.

## 2023-09-27 NOTE — Progress Notes (Signed)
 Pt transported to HD straight from radiology

## 2023-09-27 NOTE — Progress Notes (Signed)
 PROGRESS NOTE    Christian [name]  XBJ:[id number] DOB: 1945/09/28 DOA: 08/30/2023 PCP: [name], MD   Brief Narrative:  78 year old male with history of CAD status post stenting and CABG, postop CVA, chronic kidney disease stage IV, hyperlipidemia, hypertension underwent laparoscopic appendectomy by general surgery on 08/31/2023 followed by postoperative ileus requiring NG tube with hospital course complicated by acute kidney injury.  Nephrology was consulted and patient was started on hemodialysis.  He also developed paroxysmal A-fib with RVR: Cardiology was consulted.  He is currently on amiodarone  and metoprolol .  Subsequently, condition has improved.  Ileus has resolved and patient is tolerating diet.  General surgery and cardiology signed off on 09/21/2023.  PT recommending SNF placement.  General surgery consulted on 09/25/2023 for persistent abdominal pain with leukocytosis and concern for ileus on abdominal x-ray.  Assessment & Plan:   Acute perforated appendicitis with abscess status post surgical intervention on 08/31/2023 followed by postop ileus -Patient completed 2 weeks course of Zosyn .  General surgery was involved and patient had JP drain and NG tube.  Subsequently condition has improved.  Ileus has resolved and patient is tolerating diet.  Off of antibiotics.  General surgery signed off on 09/21/2023: Outpatient follow-up with general surgery - Patient has not had a bowel movement since 09/18/2023: Stool softeners and laxatives ordered on 09/24/2023.  X-ray of abdomen revealed possible ileus.  -General surgery consulted again on 09/25/2023.  CT of the abdomen and pelvis showed possible abscess.  IR has been consulted and is planning for possible drain placement today.  Patient has been started on Zosyn , renally dosed by general surgery on 09/26/2023.  AKI on CKD stage IV with concerns for ATN/possible new ESRD Acute metabolic acidosis - Nephrology following.  Dialysis as per  nephrology schedule and recommendations.  Will need outpatient hemodialysis unit arranged prior to discharge - Will need outpatient follow-up with vascular surgery regarding AV fistula  Acute blood loss anemia -Required 1 unit packed red cell transfusion during this hospitalization.  Hemoglobin stable currently.  Monitor  Hyponatremia - Mild.  Monitor.  Managed by nephrology by hemodialysis.  Leukocytosis -Monitor.  Still significantly elevated at 15.9 on 09/25/2023.  Labs pending today  Paroxysmal A-fib with RVR - Currently rate controlled.  Continue oral amiodarone  and metoprolol : Decreased dose of metoprolol  to 25 mg twice a day on 09/25/2023 because of hypotension.  Outpatient follow-up with cardiology.  Cardiology signed off on 09/21/2023  Essential hypertension Hypotension - Blood pressure intermittently on the lower side.  Started midodrine  on 09/23/2023.  Continue oral metoprolol  as discussed above  Diabetes mellitus type 2 - Continue CBGs with SSI  CAD with history of stenting and CABG - Stable.  Outpatient follow-up with cardiologist  Hypokalemia - Resolved.  Hyperphosphatemia - Managed by nephrology  Hypoalbuminemia - Follow nutrition recommendations   DVT prophylaxis: Heparin  subcutaneous Code Status: Full Family Communication: Called wife on phone on 09/26/2023.  She did not pick up.   Disposition Plan: Status is: Inpatient Remains inpatient appropriate because: Of severity of illness.  Need for SNF placement.    Consultants: General Surgery/PCCM/nephrology/vascular surgery/cardiology/ID/IR  Procedures: As above  Antimicrobials:  Anti-infectives (From admission, onward)    Start     Dose/Rate Route Frequency Ordered Stop   09/26/23 1030  piperacillin -tazobactam (ZOSYN ) IVPB 2.25 g       Note to Pharmacy: Pharmacy may adjust dosing strength, schedule, rate of infusion, etc as needed to optimize therapy   2.25 g 100 mL/hr over 30  Minutes Intravenous Every 8  hours 09/26/23 0942 10/01/23 1359   09/16/23 1300  ceFAZolin  (ANCEF ) IVPB 2g/100 mL premix        2 g 200 mL/hr over 30 Minutes Intravenous  Once 09/16/23 1255 09/16/23 1347   09/13/23 2000  ampicillin -sulbactam (UNASYN ) 1.5 g in sodium chloride  0.9 % 100 mL IVPB  Status:  Discontinued        1.5 g 200 mL/hr over 30 Minutes Intravenous Every 12 hours 09/13/23 1817 09/15/23 0943   08/31/23 2200  piperacillin -tazobactam (ZOSYN ) IVPB 2.25 g  Status:  Discontinued        2.25 g 100 mL/hr over 30 Minutes Intravenous Every 8 hours 08/31/23 1953 09/13/23 0858   08/31/23 0600  piperacillin -tazobactam (ZOSYN ) IVPB 3.375 g  Status:  Discontinued        3.375 g 100 mL/hr over 30 Minutes Intravenous Every 8 hours 08/31/23 0532 08/31/23 1953   08/30/23 2245  piperacillin -tazobactam (ZOSYN ) IVPB 3.375 g        3.375 g 100 mL/hr over 30 Minutes Intravenous  Once 08/30/23 2233 08/31/23 0005        Subjective: Patient seen and examined at bedside.  Poor historian.  No fever, vomiting, agitation reported.  Complains of abdominal pain.  Objective: Vitals:   09/26/23 1958 09/27/23 0024 09/27/23 0505 09/27/23 0802  BP: (!) 94/55 (!) 94/52 (!) 101/50 (!) 106/58  Pulse: 94 91 97 95  Resp: 17 18 16 17   Temp: 97.6 F (36.4 C) 99 F (37.2 C) 99 F (37.2 C) 98 F (36.7 C)  TempSrc: Oral Oral Oral   SpO2: 100% 100% 100% 95%  Weight:      Height:        Intake/Output Summary (Last 24 hours) at 09/27/2023 0829 Last data filed at 09/27/2023 9562 Gross per 24 hour  Intake 50 ml  Output --  Net 50 ml    Filed Weights   09/25/23 0806 09/25/23 1212 09/26/23 0500  Weight: 77.7 kg 77 kg 77.1 kg    Examination:  General: No distress.  On 2 L oxygen via nasal cannula.  Chronically ill and deconditioned looking ENT/neck: No JVD elevation or palpable neck masses noted respiratory: Decreased breath sounds at bases bilaterally with some crackles CVS: S1-S2 heard; rate mostly controlled  abdominal: Soft,  remains distended and tender; no organomegaly, bowel sounds are sluggish  extremities: No clubbing; mild lower extremity edema present bilaterally  CNS: Alert; slow to respond.  Poor historian.  No focal deficits noted  lymph: No cervical lymphadenopathy palpable skin: No obvious ecchymosis/rashes  psych: Not agitated.  Flat affect.   Musculoskeletal: No obvious joint tenderness/erythema    Data Reviewed: I have personally reviewed following labs and imaging studies  CBC: Recent Labs  Lab 09/23/23 0415 09/25/23 0522  WBC 14.6* 15.9*  NEUTROABS 10.2* 10.8*  HGB 8.0* 9.1*  HCT 25.5* 28.0*  MCV 101.6* 100.4*  PLT 410* 418*   Basic Metabolic Panel: Recent Labs  Lab 09/23/23 0415 09/23/23 1304 09/24/23 0625 09/25/23 0522 09/25/23 0523 09/26/23 0625  NA 136 134* 132* 132* 131* 134*  K 4.1 4.4 4.2 4.5 4.5 4.2  CL 94* 92* 94* 91* 91* 92*  CO2 23 20* 20* 22 22 23   GLUCOSE 88 86 92 98 99 90  BUN 50* 22 42* 61* 61* 36*  CREATININE 6.59* 3.51* 5.11* 6.83* 6.74* 4.74*  CALCIUM  9.3 9.2 9.5 9.0 9.0 9.3  MG 2.0  --   --  2.2  --   --  PHOS 7.0* 4.6 7.0*  --  7.4* 4.9*   GFR: Estimated Creatinine Clearance: 13.3 mL/min (A) (by C-G formula based on SCr of 4.74 mg/dL (H)). Liver Function Tests: Recent Labs  Lab 09/23/23 0415 09/23/23 1304 09/24/23 0625 09/25/23 0522 09/25/23 0523 09/26/23 0625  AST 29  --   --  36  --   --   ALT 18  --   --  22  --   --   ALKPHOS 101  --   --  98  --   --   BILITOT 0.6  --   --  0.9  --   --   PROT 8.0  --   --  8.2*  --   --   ALBUMIN  2.3* 2.5* 2.5* 2.4* 2.4* 2.3*   No results for input(s): "LIPASE", "AMYLASE" in the last 168 hours. No results for input(s): "AMMONIA" in the last 168 hours. Coagulation Profile: Recent Labs  Lab 09/26/23 0625  INR 1.1   Cardiac Enzymes: No results for input(s): "CKTOTAL", "CKMB", "CKMBINDEX", "TROPONINI" in the last 168 hours. BNP (last 3 results) No results for input(s): "PROBNP" in the last  8760 hours. HbA1C: No results for input(s): "HGBA1C" in the last 72 hours. CBG: Recent Labs  Lab 09/26/23 1048 09/26/23 1205 09/26/23 1556 09/26/23 2147 09/27/23 0800  GLUCAP 86 89 114* 115* 125*   Lipid Profile: No results for input(s): "CHOL", "HDL", "LDLCALC", "TRIG", "CHOLHDL", "LDLDIRECT" in the last 72 hours.  Thyroid  Function Tests: No results for input(s): "TSH", "T4TOTAL", "FREET4", "T3FREE", "THYROIDAB" in the last 72 hours. Anemia Panel: No results for input(s): "VITAMINB12", "FOLATE", "FERRITIN", "TIBC", "IRON", "RETICCTPCT" in the last 72 hours. Sepsis Labs: No results for input(s): "PROCALCITON", "LATICACIDVEN" in the last 168 hours.  Recent Results (from the past 240 hours)  Culture, Urine (Do not remove urinary catheter, catheter placed by urology or difficult to place)     Status: None   Collection Time: 09/23/23 12:34 PM   Specimen: Urine, Catheterized  Result Value Ref Range Status   Specimen Description URINE, CATHETERIZED  Final   Special Requests NONE  Final   Culture   Final    NO GROWTH Performed at [facility], [address], [place], [place] [address]    Report Status 09/25/2023 FINAL  Final  Culture, blood (Routine X 2) w Reflex to ID Panel     Status: None (Preliminary result)   Collection Time: 09/23/23  1:04 PM   Specimen: BLOOD  Result Value Ref Range Status   Specimen Description BLOOD SITE NOT SPECIFIED  Final   Special Requests   Final    BOTTLES DRAWN AEROBIC ONLY Blood Culture results may not be optimal due to an inadequate volume of blood received in culture bottles   Culture   Final    NO GROWTH 3 DAYS Performed at [facility], [address], [place], [place] [address]    Report Status PENDING  Incomplete  Culture, blood (Routine X 2) w Reflex to ID Panel     Status: None (Preliminary result)   Collection Time: 09/23/23  1:04 PM   Specimen: BLOOD RIGHT HAND  Result Value Ref Range Status   Specimen Description  BLOOD RIGHT HAND  Final   Special Requests   Final    BOTTLES DRAWN AEROBIC ONLY Blood Culture results may not be optimal due to an inadequate volume of blood received in culture bottles   Culture   Final  NO GROWTH 3 DAYS Performed at [facility], [address] [place], [place] [address]    Report Status PENDING  Incomplete         Radiology Studies: CT ABDOMEN PELVIS W CONTRAST Result Date: 09/26/2023 CLINICAL DATA:  Postop abdominal pain.  Evaluate for abscess. EXAM: CT ABDOMEN AND PELVIS WITH CONTRAST TECHNIQUE: Multidetector CT imaging of the abdomen and pelvis was performed using the standard protocol following bolus administration of intravenous contrast. RADIATION DOSE REDUCTION: This exam was performed according to the departmental dose-optimization program which includes automated exposure control, adjustment of the mA and/or kV according to patient size and/or use of iterative reconstruction technique. COMPARISON:  09/13/2023 FINDINGS: Lower chest: Bibasilar collapse/consolidation with probable focus of rounded atelectasis in the left lower lobe, similar to prior. Moderate right and small left pleural effusion similar to prior. Hepatobiliary: No suspicious focal abnormality within the liver parenchyma. Layering sludge or stones seen in the gallbladder. No intrahepatic or extrahepatic biliary dilation. Pancreas: No focal mass lesion. No dilatation of the main duct. No intraparenchymal cyst. No peripancreatic edema. Spleen: Small subcapsular lesion posterior spleen is stable, nonspecific but likely benign. Adrenals/Urinary Tract: Bilateral adrenal thickening again noted. Simple left renal cyst is stable. Tiny well-defined homogeneous low-density lesions in both kidneys are too small to characterize but are statistically most likely benign and probably cysts. No followup imaging is recommended. No evidence for hydroureter. Bladder is decompressed by Foley catheter. Stomach/Bowel:  Stomach is unremarkable. No gastric wall thickening. No evidence of outlet obstruction. Duodenum is normally positioned as is the ligament of Treitz. No small bowel obstruction. Terminal ileum courses through a region of inflammatory change in the right lower quadrant. Patient is status post appendectomy. No gross colonic mass. No colonic wall thickening. Diverticular changes are noted in the left colon without evidence of diverticulitis. Vascular/Lymphatic: There is moderate atherosclerotic calcification of the abdominal aorta without aneurysm. There is no gastrohepatic or hepatoduodenal ligament lymphadenopathy. No retroperitoneal or mesenteric lymphadenopathy. No pelvic sidewall lymphadenopathy. Reproductive: The prostate gland and seminal vesicles are unremarkable. Other: Surgical drain seen on the previous study has been removed in the interval. 3.1 x 2.5 cm focal fluid collection identified anterior right pelvis on image 67/3. Although relatively well-defined, there is not substantial peripheral enhancement. A second 5.7 x 2.0 cm rim enhancing complex fluid collection is identified in the upper right pelvis in the region of the appendectomy bed. This corresponds to the location of the 7.2 x 5.3 cm complex collection seen on the previous exam. Tiny gas bubbles are seen in the non dependent region of the collection (images 53 and 55 of series 3). This collection does appear to communicate with the collection described in the paragraph above on image 66 of series 3. Free fluid is identified in the rectovesical pouch with apparent enhancement of the peritoneum. Mesenteric edema/inflammation noted in the right lower quadrant corresponding to the region of surgery. Musculoskeletal: Small gas bubbles in the subcutaneous fat of the anterior right abdominal wall likely reflects an injection site. No worrisome lytic or sclerotic osseous abnormality. IMPRESSION: 1. Interval removal of surgical drain. 2. 3.1 x 2.5 cm  focal fluid collection anterior right pelvis. Although relatively well-defined, there is not substantial peripheral enhancement. This collection does appear to communicate with a 5.7 x 2.0 cm rim enhancing complex fluid collection in the upper right pelvis in the region of the appendectomy bed. This larger rim enhancing collection corresponds to the location of the 7.2 x 5.3 cm  complex collection seen on the previous exam. Tiny gas bubbles are contained within the rim enhancing fluid collection. Imaging features are compatible with abscess. Bowel leak not entirely excluded. 3. Fluid collection in the rectovesical pouch with adjacent peritoneal enhancement. Phlegmon/Evolving abscess a concern. 4. Layering sludge or stones in the gallbladder. 5. Left colonic diverticulosis without diverticulitis. 6. Bibasilar collapse/consolidation with probable focus of rounded atelectasis in the left lower lobe, similar to prior. Moderate right and small left pleural effusion similar to prior. 7.  Aortic Atherosclerosis (ICD10-I70.0). Electronically Signed   By: Donnal Fusi M.D.   On: 09/26/2023 09:37        Scheduled Meds:  acetaminophen   1,000 mg Oral TID WC   bisacodyl   10 mg Rectal Daily   calcium  acetate  667 mg Oral TID WC   Chlorhexidine  Gluconate Cloth  6 each Topical Q0600   darbepoetin (ARANESP ) injection - DIALYSIS  100 mcg Subcutaneous Q Thu-1800   feeding supplement  237 mL Oral BID BM   fluticasone  furoate-vilanterol  1 puff Inhalation Daily   heparin  injection (subcutaneous)  5,000 Units Subcutaneous Q8H   insulin  aspart  0-20 Units Subcutaneous TID WC   metoprolol  tartrate  25 mg Oral BID   midodrine   10 mg Oral TID WC   multivitamin  1 tablet Oral QHS   polyethylene glycol  17 g Oral BID   senna-docusate  1 tablet Oral BID   sodium chloride  flush  3 mL Intravenous Q12H   thiamine   100 mg Oral Daily   Continuous Infusions:  piperacillin -tazobactam (ZOSYN )  IV 2.25 g (09/27/23 0700)           Audria Leather, MD [facility] 09/27/2023, 8:29 AM

## 2023-09-27 NOTE — Progress Notes (Signed)
 Out Patient Arrangements:  Per Lauraine Polite note from 09-24-23 "Pt has been accepted at [facility] on MWF 10:40 am chair time. Pt can start as soon as Monday if medically stable by then. Pt will need to arrive at 9:30 am for first appt to complete paperwork prior to treatment."  Gable Johann [facility] [phone]

## 2023-09-27 NOTE — Procedures (Signed)
 Pre procedural Dx: RLQ abscess Post procedural Dx: Same  Technically successful CT guided placed of a 10 Fr drainage catheter placement into the RLQ abscess yielding 1mL heme/pus.    A representative aspirated sample was capped and sent to the laboratory for analysis.    EBL: Trace Complications: None immediate  Zettie Hillock, MD Pager #: [phone]

## 2023-09-27 NOTE — Progress Notes (Signed)
 250 bolus started

## 2023-09-27 NOTE — Progress Notes (Signed)
 [facility] Progress Note   Assessment/ Plan:    Expand All Collapse All  [facility] NEPHROLOGY PROGRESS NOTE   Assessment/ Plan: Pt is a 78 y.o. yo male with CAD status post CABG, underwent laparoscopic appendectomy on 5/7 developed postop ileus, AKI on CKD.   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  UA with pyuria and kidney ultrasound without hydronephrosis. No sign of renal recovery with worsening azotemia. First HD on 5/14 CLIP in process- came in the chair and tolerated rx Thursday Has MWF OP spot- converting to MWF here today   # Vascular dialysis access: Patient has old AV fistula which was stenosed therefore the patient underwent fistulogram and angioplasty on 5/13.  On 5/14, the AV fistula is unable to cannulate even with small needle 17G.   TDC  with [facility], 5/22. [facility] to follow as an outpatient regarding his AV fistula   # Appendicitis with possible abscess and postop ileus: s/p  antibiotics and drain per surgical team.  Another drain placed today   # Acute blood loss anemia, CT angio negative for bleeding.  Transfuse as needed.  Started ESA 5/22.  No IV iron until infectious issues clearly resolved.     # CKD-MBD, hyperphosphatemia: Phosphorus stable and with poor p.o. is off sevelamer , PTH level 337, continue to monitor.   # Hypokalemia: Managed with HD, monitor lab.   Adrian Alba MD [facility] Pager [phone]   Subjective:    Seen in HD.   Just had drain placed in IR so missed am midodrin e- was just given.  BP 90s.     Objective:   BP (!) 94/59   Pulse 94   Temp 97.6 F (36.4 C)   Resp 18   Ht 5\' 9"  (1.753 m)   Wt 79.7 kg   SpO2 96%   BMI 25.95 kg/m   Physical Exam: Gen:NAD CVS:RRR Resp:clear, muffled at bases Abd: soft. Slightly distended UJW:JXBJY LE edema ACCESS: TDC  and L AVF + T/B but has infiltration  - using TDC today  Labs: BMET Recent Labs  Lab 09/21/23 0219 09/22/23 0309 09/23/23 7829 09/23/23 1304 09/24/23 5621 09/25/23 0522 09/25/23 0523 09/26/23 0625  NA 136 136 136 134* 132* 132* 131* 134*  K 3.9 4.0 4.1 4.4 4.2 4.5 4.5 4.2  CL 95* 97* 94* 92* 94* 91* 91* 92*  CO2 25 25 23  20* 20* 22 22 23   GLUCOSE 119* 94 88 86 92 98 99 90  BUN 55* 30* 50* 22 42* 61* 61* 36*  CREATININE 6.64* 4.64* 6.59* 3.51* 5.11* 6.83* 6.74* 4.74*  CALCIUM  9.0 9.0 9.3 9.2 9.5 9.0 9.0 9.3  PHOS 6.8* 5.4* 7.0* 4.6 7.0*  --  7.4* 4.9*   CBC Recent Labs  Lab 09/23/23 0415 09/25/23 0522 09/27/23 1045  WBC 14.6* 15.9* 15.7*  NEUTROABS 10.2* 10.8*  --   HGB 8.0* 9.1* 8.1*  HCT 25.5* 28.0* 26.3*  MCV 101.6* 100.4* 103.1*  PLT 410* 418* 375      Medications:     acetaminophen   1,000 mg Oral TID WC   bisacodyl   10 mg Rectal Daily   calcium  acetate  667 mg Oral TID WC   Chlorhexidine  Gluconate Cloth  6 each Topical Q0600   darbepoetin (ARANESP ) injection - DIALYSIS  100 mcg Subcutaneous Q Thu-1800   feeding  supplement  237 mL Oral BID BM   fluticasone  furoate-vilanterol  1 puff Inhalation Daily   heparin  injection (subcutaneous)  5,000 Units Subcutaneous Q8H   insulin  aspart  0-20 Units Subcutaneous TID WC   metoprolol  tartrate  25 mg Oral BID   midodrine   10 mg Oral TID WC   multivitamin  1 tablet Oral QHS   polyethylene glycol  17 g Oral BID   senna-docusate  1 tablet Oral BID   sodium chloride  flush  3 mL Intravenous Q12H   thiamine   100 mg Oral Daily

## 2023-09-27 NOTE — Progress Notes (Signed)
   09/27/23 1522  Assess: MEWS Score  Temp 97.7 F (36.5 C)  BP (!) 97/51  MAP (mmHg) 66  Pulse Rate (!) 109  Resp 18  Level of Consciousness Alert  SpO2 100 %  O2 Device Nasal Cannula  Assess: MEWS Score  MEWS Temp 0  MEWS Systolic 1  MEWS Pulse 1  MEWS RR 0  MEWS LOC 0  MEWS Score 2  MEWS Score Color Yellow  Assess: if the MEWS score is Yellow or Red  Were vital signs accurate and taken at a resting state? Yes  Does the patient meet 2 or more of the SIRS criteria? No  Does the patient have a confirmed or suspected source of infection? No  MEWS guidelines implemented  Yes, yellow  Treat  MEWS Interventions Considered administering scheduled or prn medications/treatments as ordered  Take Vital Signs  Increase Vital Sign Frequency  Yellow: Q2hr x1, continue Q4hrs until patient remains green for 12hrs  Escalate  MEWS: Escalate Yellow: Discuss with charge nurse and consider notifying provider and/or RRT  Notify: Charge Nurse/RN  Name of Charge Nurse/RN Notified [facility]  Provider Notification  Provider Name/Title Sherran Dock  Date Provider Notified 09/27/23  Time Provider Notified 1525  Method of Notification Page  Notification Reason Other (Comment) (yellow MEWS)  Provider response No new orders  Date of Provider Response 09/27/23  Time of Provider Response 1525  Assess: SIRS CRITERIA  SIRS Temperature  0  SIRS Respirations  0  SIRS Pulse 1  SIRS WBC 0  SIRS Score Sum  1

## 2023-09-27 NOTE — Progress Notes (Signed)
 Physical Therapy Treatment Patient Details Name: Christian [name] MRN: [id number] DOB: [birth year] Today's Date: 09/27/2023   History of Present Illness 78 y.o. male admitted 08/30/23 with RLQ pain, appendicitis and perforated appendix s/p laparoscopic appendectomy 5/6. Post op ileus. 5/23 Afib. PMHx: CAD s/p CABG, HTN, CKD, DMT2, BPH, multiple sclerosis, neuropathy, mild dementia    PT Comments  Pt received in supine and reports need for assist with repositioning due to back pain. Pt declines sitting EOB due to fatigue from dialysis earlier. Pt able to reach across to bedrail and roll to the R with mod A and assist to place pillow under bottom for offloading. Pt continues to benefit from PT services to progress toward functional mobility goals.     If plan is discharge home, recommend the following: A lot of help with walking and/or transfers;Assistance with cooking/housework;Assist for transportation;Help with stairs or ramp for entrance;Supervision due to cognitive status   Can travel by private vehicle     No  Equipment Recommendations  Hospital bed;BSC/3in1;Wheelchair cushion (measurements PT);Wheelchair (measurements PT)    Recommendations for Other Services       Precautions / Restrictions Precautions Precautions: Fall Recall of Precautions/Restrictions: Impaired Precaution/Restrictions Comments: incontinent stool, watch sats and BP Restrictions Weight Bearing Restrictions Per Provider Order: No     Mobility  Bed Mobility Overal bed mobility: Needs Assistance Bed Mobility: Rolling Rolling: Mod assist         General bed mobility comments: Mod A with bedpad to roll for repositioning. Pt reaches to bedrail with cues.    Transfers                        Ambulation/Gait                   Stairs             Wheelchair Mobility     Tilt Bed    Modified Rankin (Stroke Patients Only)       Balance                                             Communication Communication Communication: Impaired Factors Affecting Communication: Difficulty expressing self;Reduced clarity of speech  Cognition Arousal: Alert Behavior During Therapy: Flat affect, Agitated   PT - Cognitive impairments: Problem solving, Initiation, Safety/Judgement, No family/caregiver present to determine baseline, Awareness Difficult to assess due to: Impaired communication                       Following commands: Impaired Following commands impaired: Follows one step commands inconsistently    Cueing Cueing Techniques: Verbal cues, Gestural cues, Tactile cues  Exercises      General Comments        Pertinent Vitals/Pain Pain Assessment Pain Assessment: Faces Faces Pain Scale: Hurts even more Pain Location: back Pain Descriptors / Indicators: Grimacing, Guarding Pain Intervention(s): Monitored during session, Limited activity within patient's tolerance, Repositioned     PT Goals (current goals can now be found in the care plan section) Acute Rehab PT Goals Patient Stated Goal: to have less pain when moving PT Goal Formulation: With patient/family Time For Goal Achievement: 09/29/23 Progress towards PT goals: Not progressing toward goals - comment (limited by pain)    Frequency    Min 2X/week  AM-PAC PT "6 Clicks" Mobility   Outcome Measure  Help needed turning from your back to your side while in a flat bed without using bedrails?: A Lot Help needed moving from lying on your back to sitting on the side of a flat bed without using bedrails?: A Lot Help needed moving to and from a bed to a chair (including a wheelchair)?: A Lot Help needed standing up from a chair using your arms (e.g., wheelchair or bedside chair)?: A Lot Help needed to walk in hospital room?: Total Help needed climbing 3-5 steps with a railing? : Total 6 Click Score: 10    End of Session Equipment Utilized During Treatment:  Oxygen Activity Tolerance: Patient limited by pain;Patient limited by fatigue Patient left: with call bell/phone within reach;in bed;with family/visitor present;with nursing/sitter in room Nurse Communication: Mobility status PT Visit Diagnosis: Other abnormalities of gait and mobility (R26.89);Muscle weakness (generalized) (M62.81)     Time: 1610-9604 PT Time Calculation (min) (ACUTE ONLY): 8 min  Charges:    $Therapeutic Activity: 8-22 mins PT General Charges $$ ACUTE PT VISIT: 1 Visit                     [name], PTA Acute Rehabilitation Services Secure Chat Preferred  [phone]    [name] 09/27/2023, 3:38 PM

## 2023-09-28 DIAGNOSIS — K37 Unspecified appendicitis: Secondary | ICD-10-CM | POA: Diagnosis not present

## 2023-09-28 LAB — CULTURE, BLOOD (ROUTINE X 2)
Culture: NO GROWTH
Culture: NO GROWTH

## 2023-09-28 LAB — RENAL FUNCTION PANEL
Albumin: 1.9 g/dL — ABNORMAL LOW (ref 3.5–5.0)
Anion gap: 15 (ref 5–15)
BUN: 27 mg/dL — ABNORMAL HIGH (ref 8–23)
CO2: 23 mmol/L (ref 22–32)
Calcium: 8.6 mg/dL — ABNORMAL LOW (ref 8.9–10.3)
Chloride: 97 mmol/L — ABNORMAL LOW (ref 98–111)
Creatinine, Ser: 4.43 mg/dL — ABNORMAL HIGH (ref 0.61–1.24)
GFR, Estimated: 13 mL/min — ABNORMAL LOW (ref >60)
Glucose, Bld: 97 mg/dL (ref 70–99)
Phosphorus: 4.4 mg/dL (ref 2.5–4.6)
Potassium: 3.7 mmol/L (ref 3.5–5.1)
Sodium: 135 mmol/L (ref 135–145)

## 2023-09-28 LAB — CBC WITH DIFFERENTIAL/PLATELET
Abs Immature Granulocytes: 0 10*3/uL (ref 0.00–0.07)
Basophils Absolute: 0 10*3/uL (ref 0.0–0.1)
Basophils Relative: 0 %
Eosinophils Absolute: 0.7 10*3/uL — ABNORMAL HIGH (ref 0.0–0.5)
Eosinophils Relative: 5 %
HCT: 25.5 % — ABNORMAL LOW (ref 39.0–52.0)
Hemoglobin: 7.9 g/dL — ABNORMAL LOW (ref 13.0–17.0)
Lymphocytes Relative: 9 %
Lymphs Abs: 1.3 10*3/uL (ref 0.7–4.0)
MCH: 32 pg (ref 26.0–34.0)
MCHC: 31 g/dL (ref 30.0–36.0)
MCV: 103.2 fL — ABNORMAL HIGH (ref 80.0–100.0)
Monocytes Absolute: 1.3 10*3/uL — ABNORMAL HIGH (ref 0.1–1.0)
Monocytes Relative: 9 %
Neutro Abs: 10.9 10*3/uL — ABNORMAL HIGH (ref 1.7–7.7)
Neutrophils Relative %: 77 %
Platelets: 298 10*3/uL (ref 150–400)
RBC: 2.47 MIL/uL — ABNORMAL LOW (ref 4.22–5.81)
RDW: 17.2 % — ABNORMAL HIGH (ref 11.5–15.5)
WBC: 14.2 10*3/uL — ABNORMAL HIGH (ref 4.0–10.5)
nRBC: 0 /100{WBCs}
nRBC: 0.2 % (ref 0.0–0.2)

## 2023-09-28 LAB — GLUCOSE, CAPILLARY
Glucose-Capillary: 108 mg/dL — ABNORMAL HIGH (ref 70–99)
Glucose-Capillary: 130 mg/dL — ABNORMAL HIGH (ref 70–99)
Glucose-Capillary: 104 mg/dL — ABNORMAL HIGH (ref 70–99)
Glucose-Capillary: 86 mg/dL (ref 70–99)

## 2023-09-28 MED ORDER — MIDODRINE HCL 5 MG PO TABS
10.0000 mg | ORAL_TABLET | Freq: Once | ORAL | Status: AC
  Administered 2023-09-28: 10 mg via ORAL
  Filled 2023-09-28: qty 2

## 2023-09-28 MED ORDER — SODIUM CHLORIDE 0.9 % IV BOLUS
500.0000 mL | Freq: Once | INTRAVENOUS | Status: AC
  Administered 2023-09-28: 500 mL via INTRAVENOUS

## 2023-09-28 MED ORDER — SODIUM CHLORIDE 0.9 % IV BOLUS
250.0000 mL | Freq: Once | INTRAVENOUS | Status: AC
  Administered 2023-09-28: 250 mL via INTRAVENOUS

## 2023-09-28 MED ORDER — MIDODRINE HCL 5 MG PO TABS
20.0000 mg | ORAL_TABLET | Freq: Three times a day (TID) | ORAL | Status: DC
  Administered 2023-09-28 – 2023-10-01 (×9): 20 mg via ORAL
  Filled 2023-09-28 (×9): qty 4

## 2023-09-28 NOTE — Progress Notes (Signed)
 Occupational Therapy Treatment Patient Details Name: Christian [name] MRN: [id number] DOB: [birth year] Today's Date: 09/28/2023   History of present illness 78 y.o. male admitted 08/30/23 with RLQ pain, appendicitis and perforated appendix s/p laparoscopic appendectomy 5/6. Post op ileus. 5/23 Afib. PMHx: CAD s/p CABG, HTN, CKD, DMT2, BPH, multiple sclerosis, neuropathy, mild dementia   OT comments  Pt c/o back pain at rest, itching a lot on his back, arrived with pillows placed under R side to reduce pressure off back. Pt asked for assistance with bed level toileting hygiene, total A. Pt required max A for rolling L/R, increased time following commands, poor BUE strength, not able to reach rails without significant assistance. Pt had pain when wiping at groin, no sores noted, Pt also complained of significant itching on back, barrier cream placed on groin and back. Pt declined OOB activity, refused to sit EOB. Pt completed bed level BUE activities and FM coordination tasks, increased time to initiate movement, significant weakness. Pt given theraband and stress ball to work on general strength, family educated on how to assist Pt with performing exercises. Postacute rehab <3hrs/day still recommended, will continue to see acutely to progress as able.       If plan is discharge home, recommend the following:  Two people to help with walking and/or transfers;Two people to help with bathing/dressing/bathroom;Assistance with cooking/housework;Direct supervision/assist for medications management;Direct supervision/assist for financial management;Assist for transportation;Help with stairs or ramp for entrance   Equipment Recommendations  Wheelchair (measurements OT);Wheelchair cushion (measurements OT)    Recommendations for Other Services      Precautions / Restrictions Precautions Precautions: Fall Recall of Precautions/Restrictions: Impaired Precaution/Restrictions Comments: incontinent stool, watch  sats and BP Restrictions Weight Bearing Restrictions Per Provider Order: No       Mobility Bed Mobility Overal bed mobility: Needs Assistance Bed Mobility: Rolling Rolling: Max assist, Used rails         General bed mobility comments: max A rolling L/R, poor BUE strength, not able to reach rails without significant assistance, assistance for holding position on side.    Transfers Overall transfer level: Needs assistance                 General transfer comment: declined     Balance                                           ADL either performed or assessed with clinical judgement   ADL Overall ADL's : Needs assistance/impaired Eating/Feeding: Set up;Sitting               Upper Body Dressing : Maximal assistance;Bed level   Lower Body Dressing: Total assistance;Bed level       Toileting- Clothing Manipulation and Hygiene: Total assistance;Bed level         General ADL Comments: Pt max-total A at bed level for dressing and toileting hygiene. Pt little to no AROM to BUEs, significant weakness and lack of motivation for participation.    Extremity/Trunk Assessment Upper Extremity Assessment Upper Extremity Assessment: Generalized weakness            Vision       Perception     Praxis     Communication Communication Communication: Impaired Factors Affecting Communication: Difficulty expressing self;Reduced clarity of speech   Cognition Arousal: Alert Behavior During Therapy: Flat affect Cognition: Cognition impaired  OT - Cognition Comments: able to participate for the most part, inconsistent with following directions, not aware of deficits, STM deficits                 Following commands: Impaired Following commands impaired: Follows one step commands with increased time, Follows one step commands inconsistently      Cueing   Cueing Techniques: Verbal cues, Gestural cues, Tactile cues   Exercises      Shoulder Instructions       General Comments      Pertinent Vitals/ Pain       Pain Assessment Pain Assessment: Faces Faces Pain Scale: Hurts little more Pain Location: back, scrotum/perineum with hygiene Pain Descriptors / Indicators: Grimacing, Guarding Pain Intervention(s): Monitored during session  Home Living                                          Prior Functioning/Environment              Frequency  Min 2X/week        Progress Toward Goals  OT Goals(current goals can now be found in the care plan section)  Progress towards OT goals: Progressing toward goals  Acute Rehab OT Goals Patient Stated Goal: to improve strength OT Goal Formulation: With patient Time For Goal Achievement: 10/04/23 Potential to Achieve Goals: Fair ADL Goals Pt Will Perform Upper Body Bathing: with modified independence;sitting Pt Will Perform Lower Body Bathing: with min assist;sitting/lateral leans Pt Will Transfer to Toilet: with min assist;stand pivot transfer;bedside commode Additional ADL Goal #1: Pt will be able to sit at EOB for 5 mins to prepare for OOB activity.  Plan      Co-evaluation                 AM-PAC OT "6 Clicks" Daily Activity     Outcome Measure   Help from another person eating meals?: A Lot Help from another person taking care of personal grooming?: A Little Help from another person toileting, which includes using toliet, bedpan, or urinal?: Total Help from another person bathing (including washing, rinsing, drying)?: A Lot Help from another person to put on and taking off regular upper body clothing?: A Little Help from another person to put on and taking off regular lower body clothing?: Total 6 Click Score: 12    End of Session    OT Visit Diagnosis: Unsteadiness on feet (R26.81);Other abnormalities of gait and mobility (R26.89);Repeated falls (R29.6);Muscle weakness (generalized) (M62.81);Pain    Activity Tolerance Patient limited by fatigue   Patient Left in bed;with call bell/phone within reach;with bed alarm set;with family/visitor present   Nurse Communication Mobility status        Time: 1610-9604 OT Time Calculation (min): 40 min  Charges: OT General Charges $OT Visit: 1 Visit OT Treatments $Self Care/Home Management : 23-37 mins $Therapeutic Activity: 8-22 mins  Apryll Hinkle, OTR/L   Scherry Curtis 09/28/2023, 4:42 PM

## 2023-09-28 NOTE — Progress Notes (Signed)
 Drainage in JP is brown. Emptied 15ml. Site c/d/I

## 2023-09-28 NOTE — Progress Notes (Signed)
 PROGRESS NOTE    Christian [name]  WGN:[id number] DOB: 07/28/1945 DOA: 08/30/2023 PCP: [name], MD   Brief Narrative:  78 year old male with history of CAD status post stenting and CABG, postop CVA, chronic kidney disease stage IV, hyperlipidemia, hypertension underwent laparoscopic appendectomy by general surgery on 08/31/2023 followed by postoperative ileus requiring NG tube with hospital course complicated by acute kidney injury.  Nephrology was consulted and patient was started on hemodialysis.  He also developed paroxysmal A-fib with RVR: Cardiology was consulted.  He is currently on amiodarone  and metoprolol .  Subsequently, condition has improved.  Ileus has resolved and patient is tolerating diet.  General surgery and cardiology signed off on 09/21/2023.  PT recommending SNF placement.  General surgery consulted on 09/25/2023 for persistent abdominal pain with leukocytosis and concern for ileus on abdominal x-ray.  CT of abdomen and pelvis with contrast on 09/26/2023 showed possible abscess.  IR was consulted.  He underwent drain placement on 09/27/2023 by IR.  Assessment & Plan:   Acute perforated appendicitis with abscess status post surgical intervention on 08/31/2023 followed by postop ileus -Patient completed 2 weeks course of Zosyn .  General surgery was involved and patient had JP drain and NG tube.  Subsequently condition has improved.  Ileus has resolved and patient is tolerating diet.  Off of antibiotics.  General surgery signed off on 09/21/2023: Outpatient follow-up with general surgery - Patient has not had a bowel movement since 09/18/2023: Stool softeners and laxatives ordered on 09/24/2023.  X-ray of abdomen revealed possible ileus.  -General surgery consulted again on 09/25/2023.  CT of the abdomen and pelvis showed possible abscess.  Zosyn  restarted on 09/26/2023 by general surgery.  IR was consulted.  He underwent drain placement on 09/27/2023 by IR.  Follow cultures.  AKI on CKD  stage IV with concerns for ATN/possible new ESRD Acute metabolic acidosis - Nephrology following.  Dialysis as per nephrology schedule and recommendations.   - Will need outpatient follow-up with vascular surgery regarding AV fistula  Acute blood loss anemia -Required 1 unit packed red cell transfusion during this hospitalization.  Hemoglobin stable currently.  Monitor  Hyponatremia - Improved  Leukocytosis -Monitor.  WBCs slightly trending down to 14.2 today.   Paroxysmal A-fib with RVR - Currently has mild intermittent tachycardia.  Continue oral amiodarone .  DC metoprolol  because of hypotension.  Can restart metoprolol  if blood pressure improves.  Outpatient follow-up with cardiology.  Cardiology signed off on 09/21/2023  Essential hypertension Hypotension - Blood pressure intermittently on the lower side.  Started midodrine  on 09/23/2023.  Continue midodrine .  DC metoprolol .  Patient required IV fluid boluses yesterday because of hypotension.  Diabetes mellitus type 2 - Continue CBGs with SSI  CAD with history of stenting and CABG - Stable.  Outpatient follow-up with cardiologist  Hypokalemia - Resolved.  Hyperphosphatemia - Managed by nephrology  Hypoalbuminemia - Follow nutrition recommendations   DVT prophylaxis: Heparin  subcutaneous Code Status: Full Family Communication: Called wife on phone on 09/26/2023.  She did not pick up.   Disposition Plan: Status is: Inpatient Remains inpatient appropriate because: Of severity of illness.  Need for SNF placement.    Consultants: General Surgery/PCCM/nephrology/vascular surgery/cardiology/ID/IR  Procedures: As above  Antimicrobials:  Anti-infectives (From admission, onward)    Start     Dose/Rate Route Frequency Ordered Stop   09/26/23 1030  piperacillin -tazobactam (ZOSYN ) IVPB 2.25 g       Note to Pharmacy: Pharmacy may adjust dosing strength, schedule, rate of infusion, etc as  needed to optimize therapy   2.25  g 100 mL/hr over 30 Minutes Intravenous Every 8 hours 09/26/23 0942 10/01/23 1359   09/16/23 1300  ceFAZolin  (ANCEF ) IVPB 2g/100 mL premix        2 g 200 mL/hr over 30 Minutes Intravenous  Once 09/16/23 1255 09/16/23 1347   09/13/23 2000  ampicillin -sulbactam (UNASYN ) 1.5 g in sodium chloride  0.9 % 100 mL IVPB  Status:  Discontinued        1.5 g 200 mL/hr over 30 Minutes Intravenous Every 12 hours 09/13/23 1817 09/15/23 0943   08/31/23 2200  piperacillin -tazobactam (ZOSYN ) IVPB 2.25 g  Status:  Discontinued        2.25 g 100 mL/hr over 30 Minutes Intravenous Every 8 hours 08/31/23 1953 09/13/23 0858   08/31/23 0600  piperacillin -tazobactam (ZOSYN ) IVPB 3.375 g  Status:  Discontinued        3.375 g 100 mL/hr over 30 Minutes Intravenous Every 8 hours 08/31/23 0532 08/31/23 1953   08/30/23 2245  piperacillin -tazobactam (ZOSYN ) IVPB 3.375 g        3.375 g 100 mL/hr over 30 Minutes Intravenous  Once 08/30/23 2233 08/31/23 0005        Subjective: Patient seen and examined at bedside.  Poor historian.  Continues to have intermittent abdominal pain.  No fever, agitation, seizures or vomiting reported.   Objective: Vitals:   09/27/23 1928 09/27/23 2306 09/28/23 0437 09/28/23 0734  BP: (!) 90/56 (!) 97/53 (!) 79/63 (!) 95/55  Pulse: 97 97 (!) 105 99  Resp: (!) 3 19 16 17   Temp: 97.9 F (36.6 C) 97.6 F (36.4 C) 98.4 F (36.9 C) 97.9 F (36.6 C)  TempSrc: Oral Oral Oral Oral  SpO2: 100% 100% 100%   Weight:      Height:        Intake/Output Summary (Last 24 hours) at 09/28/2023 0831 Last data filed at 09/28/2023 0626 Gross per 24 hour  Intake 860 ml  Output 720 ml  Net 140 ml    Filed Weights   09/26/23 0500 09/27/23 1010 09/27/23 1405  Weight: 77.1 kg 79.7 kg 79 kg    Examination:  General: On 3 L oxygen via nasal cannula.  No acute distress..  Chronically ill and deconditioned looking ENT/neck: No obvious thyromegaly or elevated JVD noted respiratory: Bilateral decreased  breath sounds at bases with some crackles CVS: Mild intermittent tachycardia present; S1 and S2 are heard abdominal: Soft, still slightly distended and tender; no organomegaly, sluggish bowel sounds.  Right lower quadrant drain present. extremities: Trace lower extremity edema; no cyanosis  CNS: Awake; remains slow to respond.  Poor historian.  No obvious focal deficits lymph: No palpable lymphadenopathy noted  skin: No obvious petechia/lesions psych: Affect is mostly flat.  Not agitated currently.   Musculoskeletal: No obvious joint swelling/deformity  Data Reviewed: I have personally reviewed following labs and imaging studies  CBC: Recent Labs  Lab 09/23/23 0415 09/25/23 0522 09/27/23 1045 09/28/23 0603  WBC 14.6* 15.9* 15.7* 14.2*  NEUTROABS 10.2* 10.8*  --  10.9*  HGB 8.0* 9.1* 8.1* 7.9*  HCT 25.5* 28.0* 26.3* 25.5*  MCV 101.6* 100.4* 103.1* 103.2*  PLT 410* 418* 375 298   Basic Metabolic Panel: Recent Labs  Lab 09/23/23 0415 09/23/23 1304 09/24/23 0625 09/25/23 0522 09/25/23 0523 09/26/23 0625 09/27/23 1045 09/28/23 0603  NA 136   < > 132* 132* 131* 134* 131* 135  K 4.1   < > 4.2 4.5 4.5 4.2 4.4 3.7  CL 94*   < > 94* 91* 91* 92* 93* 97*  CO2 23   < > 20* 22 22 23 24 23   GLUCOSE 88   < > 92 98 99 90 115* 97  BUN 50*   < > 42* 61* 61* 36* 49* 27*  CREATININE 6.59*   < > 5.11* 6.83* 6.74* 4.74* 6.87* 4.43*  CALCIUM  9.3   < > 9.5 9.0 9.0 9.3 8.7* 8.6*  MG 2.0  --   --  2.2  --   --   --   --   PHOS 7.0*   < > 7.0*  --  7.4* 4.9* 5.3* 4.4   < > = values in this interval not displayed.   GFR: Estimated Creatinine Clearance: 14.2 mL/min (A) (by C-G formula based on SCr of 4.43 mg/dL (H)). Liver Function Tests: Recent Labs  Lab 09/23/23 0415 09/23/23 1304 09/25/23 0522 09/25/23 0523 09/26/23 0625 09/27/23 1045 09/28/23 0603  AST 29  --  36  --   --   --   --   ALT 18  --  22  --   --   --   --   ALKPHOS 101  --  98  --   --   --   --   BILITOT 0.6  --  0.9   --   --   --   --   PROT 8.0  --  8.2*  --   --   --   --   ALBUMIN  2.3*   < > 2.4* 2.4* 2.3* 2.1* 1.9*   < > = values in this interval not displayed.   No results for input(s): "LIPASE", "AMYLASE" in the last 168 hours. No results for input(s): "AMMONIA" in the last 168 hours. Coagulation Profile: Recent Labs  Lab 09/26/23 0625  INR 1.1   Cardiac Enzymes: No results for input(s): "CKTOTAL", "CKMB", "CKMBINDEX", "TROPONINI" in the last 168 hours. BNP (last 3 results) No results for input(s): "PROBNP" in the last 8760 hours. HbA1C: No results for input(s): "HGBA1C" in the last 72 hours. CBG: Recent Labs  Lab 09/26/23 2147 09/27/23 0800 09/27/23 1516 09/27/23 1928 09/28/23 0828  GLUCAP 115* 125* 141* 100* 108*   Lipid Profile: No results for input(s): "CHOL", "HDL", "LDLCALC", "TRIG", "CHOLHDL", "LDLDIRECT" in the last 72 hours.  Thyroid  Function Tests: No results for input(s): "TSH", "T4TOTAL", "FREET4", "T3FREE", "THYROIDAB" in the last 72 hours. Anemia Panel: No results for input(s): "VITAMINB12", "FOLATE", "FERRITIN", "TIBC", "IRON", "RETICCTPCT" in the last 72 hours. Sepsis Labs: No results for input(s): "PROCALCITON", "LATICACIDVEN" in the last 168 hours.  Recent Results (from the past 240 hours)  Culture, Urine (Do not remove urinary catheter, catheter placed by urology or difficult to place)     Status: None   Collection Time: 09/23/23 12:34 PM   Specimen: Urine, Catheterized  Result Value Ref Range Status   Specimen Description URINE, CATHETERIZED  Final   Special Requests NONE  Final   Culture   Final    NO GROWTH Performed at [facility], [address], [place], [place] [address]    Report Status 09/25/2023 FINAL  Final  Culture, blood (Routine X 2) w Reflex to ID Panel     Status: None (Preliminary result)   Collection Time: 09/23/23  1:04 PM   Specimen: BLOOD  Result Value Ref Range Status   Specimen Description BLOOD SITE NOT SPECIFIED   Final   Special Requests   Final  BOTTLES DRAWN AEROBIC ONLY Blood Culture results may not be optimal due to an inadequate volume of blood received in culture bottles   Culture   Final    NO GROWTH 4 DAYS Performed at [facility], [address], [place], [place] [address]    Report Status PENDING  Incomplete  Culture, blood (Routine X 2) w Reflex to ID Panel     Status: None (Preliminary result)   Collection Time: 09/23/23  1:04 PM   Specimen: BLOOD RIGHT HAND  Result Value Ref Range Status   Specimen Description BLOOD RIGHT HAND  Final   Special Requests   Final    BOTTLES DRAWN AEROBIC ONLY Blood Culture results may not be optimal due to an inadequate volume of blood received in culture bottles   Culture   Final    NO GROWTH 4 DAYS Performed at [facility], [address], [place], [place] [address]    Report Status PENDING  Incomplete  Aerobic/Anaerobic Culture w Gram Stain (surgical/deep wound)     Status: None (Preliminary result)   Collection Time: 09/27/23 10:01 AM   Specimen: Abdomen; Abscess  Result Value Ref Range Status   Specimen Description ABDOMEN  Final   Special Requests Normal  Final   Gram Stain   Final    ABUNDANT WBC PRESENT, PREDOMINANTLY PMN RARE GRAM NEGATIVE RODS Performed at [facility], [address] [place], [place] [address]    Culture PENDING  Incomplete   Report Status PENDING  Incomplete         Radiology Studies: CT GUIDED VISCERAL FLUID DRAIN BY [facility] CATH Result Date: 09/27/2023 INDICATION: Right lower quadrant abscess. CT scan of the abdomen pelvis September 26, 2023 demonstrates right lower quadrant abscess and phlegmon EXAM: CT GUIDED DRAINAGE OF right lower quadrant ABSCESS TECHNIQUE: Multidetector CT imaging of the pelvis was performed following the standard protocol with/without IV contrast. RADIATION DOSE REDUCTION: This exam was performed according to the departmental dose-optimization program which includes  automated exposure control, adjustment of the mA and/or kV according to patient size and/or use of iterative reconstruction technique. MEDICATIONS: The patient is currently admitted to the hospital and receiving intravenous antibiotics. The antibiotics were administered within an appropriate time frame prior to the initiation of the procedure. ANESTHESIA/SEDATION: Moderate (conscious) sedation was employed during this procedure. A total of Versed  1.5 mg and Fentanyl  75 mcg was administered intravenously by the radiology nurse. Total intra-service moderate Sedation Time: 24 minutes. The patient's level of consciousness and vital signs were monitored continuously by radiology nursing throughout the procedure under my direct supervision. COMPLICATIONS: None immediate. TECHNIQUE: Informed written consent was obtained from the patient after a thorough discussion of the procedural risks, benefits and alternatives. All questions were addressed. Maximal Sterile Barrier Technique was utilized including caps, mask, sterile gowns, sterile gloves, sterile drape, hand hygiene and skin antiseptic. A timeout was performed prior to the initiation of the procedure. PROCEDURE: The operative field was prepped with right lower quadrant in a sterile fashion, and a sterile drape was applied covering the operative field. A sterile gown and sterile gloves were used for the procedure. Local anesthesia was provided with 1% Lidocaine . FINDINGS: The abnormal fluid collections and phlegmon in the right lower quadrant has evolved since prior imaging. The largest collection is still present. After prepping and draping the right lower quadrant, local anesthesia was achieved with 1% lidocaine . After infiltrating the subcutaneous tissue and musculature with lidocaine , a small incision was  made. A 10 cm Wendel Hals was then advanced in sequential fashion until the tip of the needle was identified within the fluid collection. The needle was removed leaving  the cannula in place in the short Amplatz wire was then advanced and repeat imaging demonstrated positioning of the guidewire. The cannula was then removed and the access site was dilated using a fascial dilator. A 10 French skater pigtail catheter was then advanced over the guidewire and coiled within the abscess. Locking mechanism was engaged. The catheter was connected to a JP bulb. Retention suture and sterile dressing applied. A small sample was also obtained and sent to laboratory for culture. IMPRESSION: Successful placement of a right lower quadrant 10 French pigtail drainage catheter in the abscess cavity. Electronically Signed   By: Susan Ensign   On: 09/27/2023 10:12   CT ABDOMEN PELVIS W CONTRAST Result Date: 09/26/2023 CLINICAL DATA:  Postop abdominal pain.  Evaluate for abscess. EXAM: CT ABDOMEN AND PELVIS WITH CONTRAST TECHNIQUE: Multidetector CT imaging of the abdomen and pelvis was performed using the standard protocol following bolus administration of intravenous contrast. RADIATION DOSE REDUCTION: This exam was performed according to the departmental dose-optimization program which includes automated exposure control, adjustment of the mA and/or kV according to patient size and/or use of iterative reconstruction technique. COMPARISON:  09/13/2023 FINDINGS: Lower chest: Bibasilar collapse/consolidation with probable focus of rounded atelectasis in the left lower lobe, similar to prior. Moderate right and small left pleural effusion similar to prior. Hepatobiliary: No suspicious focal abnormality within the liver parenchyma. Layering sludge or stones seen in the gallbladder. No intrahepatic or extrahepatic biliary dilation. Pancreas: No focal mass lesion. No dilatation of the main duct. No intraparenchymal cyst. No peripancreatic edema. Spleen: Small subcapsular lesion posterior spleen is stable, nonspecific but likely benign. Adrenals/Urinary Tract: Bilateral adrenal thickening again noted.  Simple left renal cyst is stable. Tiny well-defined homogeneous low-density lesions in both kidneys are too small to characterize but are statistically most likely benign and probably cysts. No followup imaging is recommended. No evidence for hydroureter. Bladder is decompressed by Foley catheter. Stomach/Bowel: Stomach is unremarkable. No gastric wall thickening. No evidence of outlet obstruction. Duodenum is normally positioned as is the ligament of Treitz. No small bowel obstruction. Terminal ileum courses through a region of inflammatory change in the right lower quadrant. Patient is status post appendectomy. No gross colonic mass. No colonic wall thickening. Diverticular changes are noted in the left colon without evidence of diverticulitis. Vascular/Lymphatic: There is moderate atherosclerotic calcification of the abdominal aorta without aneurysm. There is no gastrohepatic or hepatoduodenal ligament lymphadenopathy. No retroperitoneal or mesenteric lymphadenopathy. No pelvic sidewall lymphadenopathy. Reproductive: The prostate gland and seminal vesicles are unremarkable. Other: Surgical drain seen on the previous study has been removed in the interval. 3.1 x 2.5 cm focal fluid collection identified anterior right pelvis on image 67/3. Although relatively well-defined, there is not substantial peripheral enhancement. A second 5.7 x 2.0 cm rim enhancing complex fluid collection is identified in the upper right pelvis in the region of the appendectomy bed. This corresponds to the location of the 7.2 x 5.3 cm complex collection seen on the previous exam. Tiny gas bubbles are seen in the non dependent region of the collection (images 53 and 55 of series 3). This collection does appear to communicate with the collection described in the paragraph above on image 66 of series 3. Free fluid is identified in the rectovesical pouch with apparent enhancement of the peritoneum. Mesenteric edema/inflammation noted in  the  right lower quadrant corresponding to the region of surgery. Musculoskeletal: Small gas bubbles in the subcutaneous fat of the anterior right abdominal wall likely reflects an injection site. No worrisome lytic or sclerotic osseous abnormality. IMPRESSION: 1. Interval removal of surgical drain. 2. 3.1 x 2.5 cm focal fluid collection anterior right pelvis. Although relatively well-defined, there is not substantial peripheral enhancement. This collection does appear to communicate with a 5.7 x 2.0 cm rim enhancing complex fluid collection in the upper right pelvis in the region of the appendectomy bed. This larger rim enhancing collection corresponds to the location of the 7.2 x 5.3 cm complex collection seen on the previous exam. Tiny gas bubbles are contained within the rim enhancing fluid collection. Imaging features are compatible with abscess. Bowel leak not entirely excluded. 3. Fluid collection in the rectovesical pouch with adjacent peritoneal enhancement. Phlegmon/Evolving abscess a concern. 4. Layering sludge or stones in the gallbladder. 5. Left colonic diverticulosis without diverticulitis. 6. Bibasilar collapse/consolidation with probable focus of rounded atelectasis in the left lower lobe, similar to prior. Moderate right and small left pleural effusion similar to prior. 7.  Aortic Atherosclerosis (ICD10-I70.0). Electronically Signed   By: Donnal Fusi M.D.   On: 09/26/2023 09:37        Scheduled Meds:  acetaminophen   1,000 mg Oral TID WC   bisacodyl   10 mg Rectal Daily   calcium  acetate  667 mg Oral TID WC   Chlorhexidine  Gluconate Cloth  6 each Topical Q0600   darbepoetin (ARANESP ) injection - DIALYSIS  100 mcg Subcutaneous Q Thu-1800   feeding supplement  237 mL Oral BID BM   fluticasone  furoate-vilanterol  1 puff Inhalation Daily   heparin  injection (subcutaneous)  5,000 Units Subcutaneous Q8H   insulin  aspart  0-20 Units Subcutaneous TID WC   metoprolol  tartrate  25 mg Oral BID    midodrine   10 mg Oral TID WC   multivitamin  1 tablet Oral QHS   polyethylene glycol  17 g Oral BID   senna-docusate  1 tablet Oral BID   sodium chloride  flush  3 mL Intravenous Q12H   sodium chloride  flush  5 mL Intracatheter Q8H   sodium chloride  flush  5 mL Intracatheter Q8H   thiamine   100 mg Oral Daily   Continuous Infusions:  piperacillin -tazobactam (ZOSYN )  IV 2.25 g (09/28/23 0613)          Audria Leather, MD [facility] 09/28/2023, 8:31 AM

## 2023-09-28 NOTE — Plan of Care (Signed)
   Problem: Education: Goal: Ability to describe self-care measures that may prevent or decrease complications (Diabetes Survival Skills Education) will improve Outcome: Progressing   Problem: Education: Goal: Individualized Educational Video(s) Outcome: Progressing   Problem: Coping: Goal: Ability to adjust to condition or change in health will improve Outcome: Progressing

## 2023-09-28 NOTE — Plan of Care (Addendum)
 BP 89/49, MD notified. 10mg  of Midodrine  given as per orders. BP rechecked , BP 82/47. MD notified. 250ml NS bolus administered. BP improved to  106/53 after bolus. MD aware.    Problem: Education: Goal: Ability to describe self-care measures that may prevent or decrease complications (Diabetes Survival Skills Education) will improve Outcome: Progressing Goal: Individualized Educational Video(s) Outcome: Progressing   Problem: Coping: Goal: Ability to adjust to condition or change in health will improve Outcome: Progressing   Problem: Fluid Volume: Goal: Ability to maintain a balanced intake and output will improve Outcome: Progressing   Problem: Health Behavior/Discharge Planning: Goal: Ability to identify and utilize available resources and services will improve Outcome: Progressing Goal: Ability to manage health-related needs will improve Outcome: Progressing   Problem: Metabolic: Goal: Ability to maintain appropriate glucose levels will improve Outcome: Progressing

## 2023-09-28 NOTE — Progress Notes (Signed)
 Pt bp running low, provider made aware, bolu ordered and started

## 2023-09-28 NOTE — Progress Notes (Addendum)
 As per nephrology, ok for nursing to remove foley.  Pt is anuric.

## 2023-09-28 NOTE — Progress Notes (Signed)
 [facility] Progress Note   Assessment/ Plan:    Expand All Collapse All  [facility] NEPHROLOGY PROGRESS NOTE   Assessment/ Plan: Pt is a 78 y.o. yo male with CAD status post CABG, underwent laparoscopic appendectomy on 5/7 developed postop ileus, AKI on CKD.   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  UA with pyuria and kidney ultrasound without hydronephrosis. No sign of renal recovery with worsening azotemia. First HD on 5/14 CLIP in process- came in the chair and tolerated rx Thursday, Mon Has MWF OP spot [facility])- on schedule here now   # Vascular dialysis access: Patient has old AV fistula which was stenosed therefore the patient underwent fistulogram and angioplasty on 5/13.  On 5/14, the AV fistula is unable to cannulate even with small needle 17G.   TDC  with [facility], 5/22. [facility] to follow as an outpatient regarding his AV fistula   # Appendicitis with possible abscess and postop ileus: s/p  antibiotics and drain per surgical team.  Another drain placed today   # Acute blood loss anemia, CT angio negative for bleeding.  Transfuse as needed.  Started ESA 5/22.  No IV iron until infectious issues clearly resolved.     # CKD-MBD, hyperphosphatemia: Phosphorus stable and with poor p.o. is off sevelamer , PTH level 337, continue to monitor.   # Hypokalemia: Managed with HD, monitor lab.    OK for d/c from nephrology perspective.  Adrian Alba MD [facility] Pager [phone]   Subjective:    Tolerated HD yesterday. UF 0.7L.  No new issues.    Objective:   BP (!) 95/55 (BP Location: Right Arm)   Pulse 99   Temp 97.9 F (36.6 C) (Oral)   Resp 17   Ht 5\' 9"  (1.753 m)   Wt 79 kg   SpO2 100%   BMI 25.72 kg/m   Physical Exam: Gen:NAD, sleeping comfortable CVS:RRR Resp:clear ant  Abd: soft.  Slightly distended UJW:JXBJY LE edema ACCESS: TDC and L AVF + T/B but has infiltration  Labs: BMET Recent Labs  Lab 09/23/23 0415 09/23/23 1304 09/24/23 7829 09/25/23 0522 09/25/23 0523 09/26/23 0625 09/27/23 1045 09/28/23 0603  NA 136 134* 132* 132* 131* 134* 131* 135  K 4.1 4.4 4.2 4.5 4.5 4.2 4.4 3.7  CL 94* 92* 94* 91* 91* 92* 93* 97*  CO2 23 20* 20* 22 22 23 24 23   GLUCOSE 88 86 92 98 99 90 115* 97  BUN 50* 22 42* 61* 61* 36* 49* 27*  CREATININE 6.59* 3.51* 5.11* 6.83* 6.74* 4.74* 6.87* 4.43*  CALCIUM  9.3 9.2 9.5 9.0 9.0 9.3 8.7* 8.6*  PHOS 7.0* 4.6 7.0*  --  7.4* 4.9* 5.3* 4.4   CBC Recent Labs  Lab 09/23/23 0415 09/25/23 0522 09/27/23 1045 09/28/23 0603  WBC 14.6* 15.9* 15.7* 14.2*  NEUTROABS 10.2* 10.8*  --  10.9*  HGB 8.0* 9.1* 8.1* 7.9*  HCT 25.5* 28.0* 26.3* 25.5*  MCV 101.6* 100.4* 103.1* 103.2*  PLT 410* 418* 375 298      Medications:     acetaminophen   1,000 mg Oral TID WC   bisacodyl   10 mg Rectal Daily   calcium  acetate  667 mg Oral TID WC   Chlorhexidine  Gluconate Cloth  6 each Topical Q0600   darbepoetin (ARANESP ) injection - DIALYSIS  100 mcg Subcutaneous Q  Thu-1800   feeding supplement  237 mL Oral BID BM   fluticasone  furoate-vilanterol  1 puff Inhalation Daily   heparin  injection (subcutaneous)  5,000 Units Subcutaneous Q8H   insulin  aspart  0-20 Units Subcutaneous TID WC   midodrine   10 mg Oral TID WC   multivitamin  1 tablet Oral QHS   polyethylene glycol  17 g Oral BID   senna-docusate  1 tablet Oral BID   sodium chloride  flush  3 mL Intravenous Q12H   sodium chloride  flush  5 mL Intracatheter Q8H   sodium chloride  flush  5 mL Intracatheter Q8H   thiamine   100 mg Oral Daily

## 2023-09-28 NOTE — Progress Notes (Signed)
 Progress Note  12 Days Post-Op  Subjective: Pt reports some soreness around drain site. Just had a large BM. Tolerating diet although appetite somewhat poor.   Objective: Vital signs in last 24 hours: Temp:  [97.2 F (36.2 C)-98.4 F (36.9 C)] 97.9 F (36.6 C) (06/03 0734) Pulse Rate:  [86-109] 99 (06/03 0734) Resp:  [3-31] 17 (06/03 0734) BP: (70-99)/(49-63) 95/55 (06/03 0734) SpO2:  [91 %-100 %] 100 % (06/03 0437) Weight:  [79 kg] 79 kg (06/02 1405) Last BM Date : 09/27/23  Intake/Output from previous day: 06/02 0701 - 06/03 0700 In: 860 [P.O.:200; IV Piggyback:650] Out: 720 [Drains:20] Intake/Output this shift: Total I/O In: 120 [P.O.:120] Out: 15 [Drains:15]  PE: Constitutional: No acute distress, conversant, appears states age. Lungs: normal respiratory effort CV: regular rate and rhythm GI: Soft, no masses or hepatosplenomegaly, mild ttp over RLQ drain, JP seropurulent, incisions C/D/I   Lab Results:  Recent Labs    09/27/23 1045 09/28/23 0603  WBC 15.7* 14.2*  HGB 8.1* 7.9*  HCT 26.3* 25.5*  PLT 375 298   BMET Recent Labs    09/27/23 1045 09/28/23 0603  NA 131* 135  K 4.4 3.7  CL 93* 97*  CO2 24 23  GLUCOSE 115* 97  BUN 49* 27*  CREATININE 6.87* 4.43*  CALCIUM  8.7* 8.6*   PT/INR Recent Labs    09/26/23 0625  LABPROT 14.2  INR 1.1   CMP     Component Value Date/Time   NA 135 09/28/2023 0603   NA 143 04/16/2022 0000   K 3.7 09/28/2023 0603   CL 97 (L) 09/28/2023 0603   CO2 23 09/28/2023 0603   GLUCOSE 97 09/28/2023 0603   BUN 27 (H) 09/28/2023 0603   BUN 35 (H) 05/22/2021 1551   CREATININE 4.43 (H) 09/28/2023 0603   CREATININE 2.87 (H) 12/20/2019 1312   CALCIUM  8.6 (L) 09/28/2023 0603   PROT 8.2 (H) 09/25/2023 0522   PROT 6.9 05/22/2021 1551   ALBUMIN  1.9 (L) 09/28/2023 0603   ALBUMIN  4.5 05/22/2021 1551   AST 36 09/25/2023 0522   ALT 22 09/25/2023 0522   ALKPHOS 98 09/25/2023 0522   BILITOT 0.9 09/25/2023 0522   BILITOT  0.2 05/22/2021 1551   GFRNONAA 13 (L) 09/28/2023 0603   GFRAA 19 (L) 10/18/2019 0046   Lipase     Component Value Date/Time   LIPASE 28 08/30/2023 1727       Studies/Results: CT GUIDED VISCERAL FLUID DRAIN BY PERC CATH Result Date: 09/27/2023 INDICATION: Right lower quadrant abscess. CT scan of the abdomen pelvis September 26, 2023 demonstrates right lower quadrant abscess and phlegmon EXAM: CT GUIDED DRAINAGE OF right lower quadrant ABSCESS TECHNIQUE: Multidetector CT imaging of the pelvis was performed following the standard protocol with/without IV contrast. RADIATION DOSE REDUCTION: This exam was performed according to the departmental dose-optimization program which includes automated exposure control, adjustment of the mA and/or kV according to patient size and/or use of iterative reconstruction technique. MEDICATIONS: The patient is currently admitted to the hospital and receiving intravenous antibiotics. The antibiotics were administered within an appropriate time frame prior to the initiation of the procedure. ANESTHESIA/SEDATION: Moderate (conscious) sedation was employed during this procedure. A total of Versed  1.5 mg and Fentanyl  75 mcg was administered intravenously by the radiology nurse. Total intra-service moderate Sedation Time: 24 minutes. The patient's level of consciousness and vital signs were monitored continuously by radiology nursing throughout the procedure under my direct supervision. COMPLICATIONS: None immediate. TECHNIQUE: Informed written  consent was obtained from the patient after a thorough discussion of the procedural risks, benefits and alternatives. All questions were addressed. Maximal Sterile Barrier Technique was utilized including caps, mask, sterile gowns, sterile gloves, sterile drape, hand hygiene and skin antiseptic. A timeout was performed prior to the initiation of the procedure. PROCEDURE: The operative field was prepped with right lower quadrant in a sterile  fashion, and a sterile drape was applied covering the operative field. A sterile gown and sterile gloves were used for the procedure. Local anesthesia was provided with 1% Lidocaine . FINDINGS: The abnormal fluid collections and phlegmon in the right lower quadrant has evolved since prior imaging. The largest collection is still present. After prepping and draping the right lower quadrant, local anesthesia was achieved with 1% lidocaine . After infiltrating the subcutaneous tissue and musculature with lidocaine , a small incision was made. A 10 cm Wendel Hals was then advanced in sequential fashion until the tip of the needle was identified within the fluid collection. The needle was removed leaving the cannula in place in the short Amplatz wire was then advanced and repeat imaging demonstrated positioning of the guidewire. The cannula was then removed and the access site was dilated using a fascial dilator. A 10 French skater pigtail catheter was then advanced over the guidewire and coiled within the abscess. Locking mechanism was engaged. The catheter was connected to a JP bulb. Retention suture and sterile dressing applied. A small sample was also obtained and sent to laboratory for culture. IMPRESSION: Successful placement of a right lower quadrant 10 French pigtail drainage catheter in the abscess cavity. Electronically Signed   By: Susan Ensign   On: 09/27/2023 10:12    Anti-infectives: Anti-infectives (From admission, onward)    Start     Dose/Rate Route Frequency Ordered Stop   09/26/23 1030  piperacillin -tazobactam (ZOSYN ) IVPB 2.25 g       Note to Pharmacy: Pharmacy may adjust dosing strength, schedule, rate of infusion, etc as needed to optimize therapy   2.25 g 100 mL/hr over 30 Minutes Intravenous Every 8 hours 09/26/23 0942 10/01/23 1359   09/16/23 1300  ceFAZolin  (ANCEF ) IVPB 2g/100 mL premix        2 g 200 mL/hr over 30 Minutes Intravenous  Once 09/16/23 1255 09/16/23 1347   09/13/23 2000   ampicillin -sulbactam (UNASYN ) 1.5 g in sodium chloride  0.9 % 100 mL IVPB  Status:  Discontinued        1.5 g 200 mL/hr over 30 Minutes Intravenous Every 12 hours 09/13/23 1817 09/15/23 0943   08/31/23 2200  piperacillin -tazobactam (ZOSYN ) IVPB 2.25 g  Status:  Discontinued        2.25 g 100 mL/hr over 30 Minutes Intravenous Every 8 hours 08/31/23 1953 09/13/23 0858   08/31/23 0600  piperacillin -tazobactam (ZOSYN ) IVPB 3.375 g  Status:  Discontinued        3.375 g 100 mL/hr over 30 Minutes Intravenous Every 8 hours 08/31/23 0532 08/31/23 1953   08/30/23 2245  piperacillin -tazobactam (ZOSYN ) IVPB 3.375 g        3.375 g 100 mL/hr over 30 Minutes Intravenous  Once 08/30/23 2233 08/31/23 0005        Assessment/Plan  POD 28 - s/p lap appy with drain placement for perforated appendicitis [name] 5/6  - Completed postoperative Zosyn  5/21 - IR drain placed 6/2 - Cx pending, fine to narrow and switch to PO abx, recommend total course 7 days abx - tolerating diet and having bowel function - IR to arrange  drain clinic follow up - surgical follow up in AVS - stable for DC to SNF from surgery perspective when medically clear - general surgery will sign off      FEN - renal diet   Lines/tubes - tunneled HD cath 5/22 [facility], PIV x2 RUE, LUE w/ AV Fistula (non-functional) VTE - SQ heparin  ID - Zosyn  6/1>>    LOS: 28 days     Annetta Killian, [facility] 09/28/2023, 11:01 AM Please see Amion for pager number during day hours 7:00am-4:30pm

## 2023-09-29 DIAGNOSIS — K3580 Unspecified acute appendicitis: Secondary | ICD-10-CM | POA: Diagnosis not present

## 2023-09-29 LAB — RENAL FUNCTION PANEL
Albumin: 1.9 g/dL — ABNORMAL LOW (ref 3.5–5.0)
Anion gap: 15 (ref 5–15)
BUN: 41 mg/dL — ABNORMAL HIGH (ref 8–23)
CO2: 24 mmol/L (ref 22–32)
Calcium: 8.8 mg/dL — ABNORMAL LOW (ref 8.9–10.3)
Chloride: 94 mmol/L — ABNORMAL LOW (ref 98–111)
Creatinine, Ser: 6.29 mg/dL — ABNORMAL HIGH (ref 0.61–1.24)
GFR, Estimated: 9 mL/min — ABNORMAL LOW (ref >60)
Glucose, Bld: 75 mg/dL (ref 70–99)
Phosphorus: 5 mg/dL — ABNORMAL HIGH (ref 2.5–4.6)
Potassium: 4.2 mmol/L (ref 3.5–5.1)
Sodium: 133 mmol/L — ABNORMAL LOW (ref 135–145)

## 2023-09-29 LAB — COMPREHENSIVE METABOLIC PANEL WITH GFR
ALT: 27 U/L (ref 0–44)
AST: 36 U/L (ref 15–41)
Albumin: 1.9 g/dL — ABNORMAL LOW (ref 3.5–5.0)
Alkaline Phosphatase: 78 U/L (ref 38–126)
Anion gap: 17 — ABNORMAL HIGH (ref 5–15)
BUN: 39 mg/dL — ABNORMAL HIGH (ref 8–23)
CO2: 21 mmol/L — ABNORMAL LOW (ref 22–32)
Calcium: 8.8 mg/dL — ABNORMAL LOW (ref 8.9–10.3)
Chloride: 95 mmol/L — ABNORMAL LOW (ref 98–111)
Creatinine, Ser: 6.34 mg/dL — ABNORMAL HIGH (ref 0.61–1.24)
GFR, Estimated: 9 mL/min — ABNORMAL LOW (ref >60)
Glucose, Bld: 75 mg/dL (ref 70–99)
Potassium: 4.1 mmol/L (ref 3.5–5.1)
Sodium: 133 mmol/L — ABNORMAL LOW (ref 135–145)
Total Bilirubin: 0.8 mg/dL (ref 0.0–1.2)
Total Protein: 6.8 g/dL (ref 6.5–8.1)

## 2023-09-29 LAB — CBC WITH DIFFERENTIAL/PLATELET
Abs Immature Granulocytes: 0 10*3/uL (ref 0.00–0.07)
Basophils Absolute: 0 10*3/uL (ref 0.0–0.1)
Basophils Relative: 0 %
Eosinophils Absolute: 0.2 10*3/uL (ref 0.0–0.5)
Eosinophils Relative: 2 %
HCT: 25 % — ABNORMAL LOW (ref 39.0–52.0)
Hemoglobin: 7.6 g/dL — ABNORMAL LOW (ref 13.0–17.0)
Lymphocytes Relative: 4 %
Lymphs Abs: 0.5 10*3/uL — ABNORMAL LOW (ref 0.7–4.0)
MCH: 31.8 pg (ref 26.0–34.0)
MCHC: 30.4 g/dL (ref 30.0–36.0)
MCV: 104.6 fL — ABNORMAL HIGH (ref 80.0–100.0)
Monocytes Absolute: 0.9 10*3/uL (ref 0.1–1.0)
Monocytes Relative: 7 %
Neutro Abs: 10.7 10*3/uL — ABNORMAL HIGH (ref 1.7–7.7)
Neutrophils Relative %: 87 %
Platelets: 311 10*3/uL (ref 150–400)
RBC: 2.39 MIL/uL — ABNORMAL LOW (ref 4.22–5.81)
RDW: 17 % — ABNORMAL HIGH (ref 11.5–15.5)
WBC: 12.3 10*3/uL — ABNORMAL HIGH (ref 4.0–10.5)
nRBC: 0 % (ref 0.0–0.2)
nRBC: 0 /100{WBCs}

## 2023-09-29 LAB — C-REACTIVE PROTEIN: CRP: 17.9 mg/dL — ABNORMAL HIGH (ref <1.0)

## 2023-09-29 LAB — GLUCOSE, CAPILLARY
Glucose-Capillary: 92 mg/dL (ref 70–99)
Glucose-Capillary: 111 mg/dL — ABNORMAL HIGH (ref 70–99)
Glucose-Capillary: 143 mg/dL — ABNORMAL HIGH (ref 70–99)
Glucose-Capillary: 88 mg/dL (ref 70–99)

## 2023-09-29 LAB — MAGNESIUM: Magnesium: 2 mg/dL (ref 1.7–2.4)

## 2023-09-29 MED ORDER — AMOXICILLIN-POT CLAVULANATE 500-125 MG PO TABS
1.0000 | ORAL_TABLET | Freq: Every day | ORAL | Status: DC
  Administered 2023-09-29 – 2023-10-01 (×3): 1 via ORAL
  Filled 2023-09-29 (×3): qty 1

## 2023-09-29 MED ORDER — LOPERAMIDE HCL 2 MG PO CAPS
2.0000 mg | ORAL_CAPSULE | ORAL | Status: DC | PRN
  Administered 2023-09-29: 2 mg via ORAL
  Filled 2023-09-29: qty 1

## 2023-09-29 MED ORDER — ACETAMINOPHEN 325 MG PO TABS: 650.0000 mg | ORAL_TABLET | Freq: Four times a day (QID) | ORAL | Status: DC | PRN

## 2023-09-29 NOTE — Progress Notes (Signed)
   09/29/23 1231  Vitals  Temp 98.2 F (36.8 C)  Pulse Rate (!) 106  Resp (!) 26  BP (!) 122/57  SpO2 100 %  Weight  (CHAIR)  Post Treatment  Dialyzer Clearance Lightly streaked  Hemodialysis Intake (mL) 0 mL  Liters Processed 78  Fluid Removed (mL) 1500 mL  Tolerated HD Treatment Yes   Received patient in bed to unit.  Alert and oriented.  Informed consent signed and in chart.   TX duration:3.25HRS  Patient tolerated well.  Transported back to the room  Alert, without acute distress.  Hand-off given to patient's nurse.   Access used: [facility] Access issues: NONE  Total UF removed: 1.5L Medication(s) given: NONE    [name]

## 2023-09-29 NOTE — TOC Progression Note (Signed)
 Transition of Care [facility]) - Progression Note    Patient Details  Name: Christian [name] MRN: [id number] Date of Birth: 1945-06-12  Transition of Care [facility]) CM/SW Contact  Carmon Christen, LCSWA Phone Number: 09/29/2023, 4:31 PM  Clinical Narrative:     CSW spoke with Bertell Broach with [facility] who informed CSW that they will have SNF bed for patient on Friday. CSW plans to restart insurance authorization. CSW will continue to follow and assist with patients dc planning needs.  Expected Discharge Plan: Skilled Nursing Facility Barriers to Discharge: Continued Medical Work up, English as a second language teacher  Expected Discharge Plan and Services In-house Referral: Clinical Social Work   Post Acute Care Choice: Skilled Nursing Facility Living arrangements for the past 2 months: Single Family Home                                       Social Determinants of Health (SDOH) Interventions SDOH Screenings   Food Insecurity: No Food Insecurity (08/31/2023)  Housing: Low Risk  (08/31/2023)  Transportation Needs: No Transportation Needs (08/31/2023)  Utilities: Not At Risk (08/31/2023)  Alcohol Screen: Low Risk  (07/23/2021)  Depression (PHQ2-9): Low Risk  (01/18/2023)  Financial Resource Strain: Low Risk  (07/23/2021)  Physical Activity: Inactive (07/23/2021)  Social Connections: Moderately Integrated (08/31/2023)  Stress: No Stress Concern Present (07/23/2021)  Tobacco Use: Medium Risk (09/16/2023)    Readmission Risk Interventions     No data to display

## 2023-09-29 NOTE — Progress Notes (Signed)
 Out Patient Arrangements:   Per Lauraine Polite note from 09-24-23 "Pt has been accepted at [facility] on MWF 10:40 am chair time. Pt can start as soon as Monday if medically stable by then. Pt will need to arrive at 9:30 am for first appt to complete paperwork prior to treatment."   Gable Johann [facility] [phone]

## 2023-09-29 NOTE — Plan of Care (Signed)
  Problem: Coping: Goal: Ability to adjust to condition or change in health will improve Outcome: Progressing   Problem: Activity: Goal: Risk for activity intolerance will decrease Outcome: Progressing   Problem: Elimination: Goal: Will not experience complications related to bowel motility Outcome: Progressing   Problem: Pain Managment: Goal: General experience of comfort will improve and/or be controlled Outcome: Progressing

## 2023-09-29 NOTE — Plan of Care (Signed)
  Problem: Education: Goal: Individualized Educational Video(s) Outcome: Progressing   Problem: Coping: Goal: Ability to adjust to condition or change in health will improve Outcome: Progressing   Problem: Fluid Volume: Goal: Ability to maintain a balanced intake and output will improve Outcome: Progressing

## 2023-09-29 NOTE — Progress Notes (Signed)
 PT Cancellation Note  Patient Details Name: Christian [name] MRN: [id number] DOB: [birth year]   Cancelled Treatment:    Reason Eval/Treat Not Completed: (P) Patient declined, no reason specified (Pt completing hygiene tasks with nursing staff and declines PT session afterward due to fatigue from dialysis earlier. Will continue to follow per PT POC.)   [name] 09/29/2023, 3:48 PM

## 2023-09-29 NOTE — Plan of Care (Signed)
  Problem: Coping: Goal: Ability to adjust to condition or change in health will improve Outcome: Progressing   Problem: Activity: Goal: Risk for activity intolerance will decrease Outcome: Progressing   Problem: Coping: Goal: Level of anxiety will decrease Outcome: Progressing   Problem: Elimination: Goal: Will not experience complications related to bowel motility 09/29/2023 1625 by Howie Mackie, LPN Outcome: Progressing 09/29/2023 1622 by Howie Mackie, LPN Outcome: Progressing   Problem: Pain Managment: Goal: General experience of comfort will improve and/or be controlled 09/29/2023 1625 by Howie Mackie, LPN Outcome: Progressing 09/29/2023 1622 by Howie Mackie, LPN Outcome: Progressing   Problem: Safety: Goal: Ability to remain free from injury will improve Outcome: Progressing

## 2023-09-29 NOTE — Progress Notes (Signed)
 Christian [name]  BMW:[id number] DOB: July 04, 1945 DOA: 08/30/2023 PCP: [name], MD    Brief Narrative:  78 year old with a history of CAD status post CABG, CVA, CKD stage IV, HLD, and HTN who underwent laparoscopic appendectomy 08/31/2023 complicated by postoperative ileus requiring NG tube placement.  He subsequently developed acute kidney injury necessitating the initiation of HD.  He has also developed paroxysmal atrial fibrillation with RVR requiring amiodarone  and metoprolol .  Despite his challenging postoperative course he had stabilized, with General Surgery and Cardiology signing off 09/21/2023.  General surgery had to be reconsulted 5/31 due to persistent abdominal pain with a CT abdomen/pelvis 6/1 revealing an abscess, for which the patient underwent drain placement in IR 6/2.  Goals of Care:   Code Status: Full Code   DVT prophylaxis: heparin  injection 5,000 Units Start: 09/09/23 1400 Place and maintain sequential compression device Start: 09/01/23 2001 SCDs Start: 08/31/23 0415   Interim Hx: Afebrile.  Vital signs stable.  No acute events reported overnight.  The patient reports that he is now having multiple liquid stools after using a bowel regimen.  He also reports the acute onset of left ankle and foot pain today which she has not previously experienced.  Assessment & Plan:  Acute perforated appendicitis status post laparoscopic appendectomy with postoperative abscess CT abdomen/pelvis 6/1 noted postoperative abscess - IR placed drain 6/2 -to complete 7 days of antibiotics to cover for current abscess, with culture thus far suggesting abundant E. 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 will transfuse another unit  Newly diagnosed paroxysmal atrial fibrillation with acute RVR Has been transitioned to oral amiodarone  -metoprolol  discontinued due to hypotension -outpatient follow-up with cardiology  HTN with acute intermittent hypotension Patient has a history of HTN but has suffered with episodes of hypotension during this admission requiring initiation of midodrine  -BP meds discontinued  DM2 CBG presently controlled  CAD status post CABG Asymptomatic  Acute left foot pain Exam without findings specifically with no wound at the heel and no erythema of the toes or the ankle joint -check uric acid  Family Communication: Spoke with family at bedside Disposition: Medically stable for SNF placement   Objective: Blood pressure (!) 100/55, pulse 99, temperature 98.4 F (36.9 C), resp. rate 18, height 5\' 9"  (1.753 m), weight 79 kg, SpO2 100%.  Intake/Output Summary (Last 24 hours) at 09/29/2023 1703 Last data filed at 09/29/2023 1510 Gross per 24 hour  Intake 275 ml  Output 1510 ml  Net -1235 ml   Filed Weights   09/27/23 1010 09/27/23 1405 09/29/23 0500  Weight: 79.7 kg 79 kg 79 kg    Examination: General: No acute respiratory distress Lungs: Clear to auscultation bilaterally without wheezes or crackles Cardiovascular: Regular rate and rhythm without murmur gallop  or rub normal S1 and S2 Abdomen: Soft, bowel sounds positive, drain in place, no rebound, no mass Extremities: No  significant cyanosis, clubbing, or edema bilateral lower extremities -see left foot exam as noted above  CBC: Recent Labs  Lab 09/25/23 0522 09/27/23 1045 09/28/23 0603 09/29/23 0623  WBC 15.9* 15.7* 14.2* 12.3*  NEUTROABS 10.8*  --  10.9* 10.7*  HGB 9.1* 8.1* 7.9* 7.6*  HCT 28.0* 26.3* 25.5* 25.0*  MCV 100.4* 103.1* 103.2* 104.6*  PLT 418* 375 298 311   Basic Metabolic Panel: Recent Labs  Lab 09/23/23 0415 09/23/23 1304 09/25/23 0522 09/25/23 0523 09/27/23 1045 09/28/23 0603 09/29/23 0623 09/29/23 0624  NA 136   < > 132*   < > 131* 135 133* 133*  K 4.1   < > 4.5   < > 4.4 3.7 4.1 4.2  CL 94*   < > 91*   < > 93* 97* 95* 94*  CO2 23   < > 22   < > 24 23 21* 24  GLUCOSE 88   < > 98   < > 115* 97 75 75  BUN 50*   < > 61*   < > 49* 27* 39* 41*  CREATININE 6.59*   < > 6.83*   < > 6.87* 4.43* 6.34* 6.29*  CALCIUM  9.3   < > 9.0   < > 8.7* 8.6* 8.8* 8.8*  MG 2.0  --  2.2  --   --   --  2.0  --   PHOS 7.0*   < >  --    < > 5.3* 4.4  --  5.0*   < > = values in this interval not displayed.   GFR: Estimated Creatinine Clearance: 10 mL/min (A) (by C-G formula based on SCr of 6.29 mg/dL (H)).   Scheduled Meds:  amoxicillin-clavulanate  1 tablet Oral Daily   calcium  acetate  667 mg Oral TID WC   Chlorhexidine  Gluconate Cloth  6 each Topical Q0600   darbepoetin (ARANESP ) injection - DIALYSIS  100 mcg Subcutaneous Q Thu-1800   feeding supplement  237 mL Oral BID BM   fluticasone  furoate-vilanterol  1 puff Inhalation Daily   heparin  injection (subcutaneous)  5,000 Units Subcutaneous Q8H   insulin  aspart  0-20 Units Subcutaneous TID WC   midodrine   20 mg Oral TID WC   multivitamin  1 tablet Oral QHS   senna-docusate  1 tablet Oral BID   sodium chloride  flush  3 mL Intravenous Q12H   sodium chloride  flush  5 mL Intracatheter Q8H   sodium chloride  flush  5 mL Intracatheter Q8H   thiamine   100 mg Oral Daily      LOS: 29 days   Abbe Abate, MD [facility] Office  [phone] Pager - Text Page per [name]  If 7PM-7AM, please contact night-coverage per Amion 09/29/2023, 5:03 PM

## 2023-09-29 NOTE — Progress Notes (Signed)
 [facility] Progress Note   Assessment/ Plan:    Expand All Collapse All  [facility] NEPHROLOGY PROGRESS NOTE   Assessment/ Plan: Pt is a 78 y.o. yo male with CAD status post CABG, underwent laparoscopic appendectomy on 5/7 developed postop ileus, AKI on CKD.   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  UA with pyuria and kidney ultrasound without hydronephrosis. No sign of renal recovery with worsening azotemia. First HD on 5/14 CLIP in process- in chair today - tolerating Has MWF OP spot ([facility])- on schedule here now   # Vascular dialysis access: Patient has old AV fistula which was stenosed therefore the patient underwent fistulogram and angioplasty on 5/13.  On 5/14, the AV fistula is unable to cannulate even with small needle 17G.   TDC  with [facility], 5/22. [facility] to follow as an outpatient regarding his AV fistula   # Appendicitis with possible abscess and postop ileus: s/p  antibiotics and drain per surgical team.  Another drain placed today   # Acute blood loss anemia, CT angio negative for bleeding.  Transfuse as needed.  Started ESA 5/22.  No IV iron until infectious issues clearly resolved.     # CKD-MBD, hyperphosphatemia: Phosphorus stable and with poor p.o. is off sevelamer , PTH level 337, continue to monitor.   # Hypokalemia: Managed with HD, monitor lab.    OK for d/c from nephrology perspective.  Adrian Alba MD [facility] Pager [phone]   Subjective:    Seen at HD this AM.  Feeling fine.  No new issues.    Objective:   BP (!) 114/53   Pulse 90   Temp 98.1 F (36.7 C)   Resp 16   Ht 5\' 9"  (1.753 m)   Wt 79 kg   SpO2 100%   BMI 25.72 kg/m   Physical Exam: Gen:NAD, sleeping comfortable CVS:RRR Resp:clear ant  Abd: soft. Slightly distended AOZ:HYQMV LE edema ACCESS: TDC and L  AVF + T/B but has infiltration  Labs: BMET Recent Labs  Lab 09/23/23 1304 09/24/23 7846 09/25/23 0522 09/25/23 9629 09/26/23 5284 09/27/23 1045 09/28/23 0603 09/29/23 0623 09/29/23 0624  NA 134* 132* 132* 131* 134* 131* 135 133* 133*  K 4.4 4.2 4.5 4.5 4.2 4.4 3.7 4.1 4.2  CL 92* 94* 91* 91* 92* 93* 97* 95* 94*  CO2 20* 20* 22 22 23 24 23  21* 24  GLUCOSE 86 92 98 99 90 115* 97 75 75  BUN 22 42* 61* 61* 36* 49* 27* 39* 41*  CREATININE 3.51* 5.11* 6.83* 6.74* 4.74* 6.87* 4.43* 6.34* 6.29*  CALCIUM  9.2 9.5 9.0 9.0 9.3 8.7* 8.6* 8.8* 8.8*  PHOS 4.6 7.0*  --  7.4* 4.9* 5.3* 4.4  --  5.0*   CBC Recent Labs  Lab 09/23/23 0415 09/25/23 0522 09/27/23 1045 09/28/23 0603 09/29/23 0623  WBC 14.6* 15.9* 15.7* 14.2* 12.3*  NEUTROABS 10.2* 10.8*  --  10.9* 10.7*  HGB 8.0* 9.1* 8.1* 7.9* 7.6*  HCT 25.5* 28.0* 26.3* 25.5* 25.0*  MCV 101.6* 100.4* 103.1* 103.2* 104.6*  PLT 410* 418* 375 298 311      Medications:     amoxicillin-clavulanate  1 tablet Oral Daily   calcium  acetate  667 mg Oral TID WC   Chlorhexidine  Gluconate Cloth  6 each Topical Q0600   darbepoetin (ARANESP ) injection -  DIALYSIS  100 mcg Subcutaneous Q Thu-1800   feeding supplement  237 mL Oral BID BM   fluticasone  furoate-vilanterol  1 puff Inhalation Daily   heparin  injection (subcutaneous)  5,000 Units Subcutaneous Q8H   insulin  aspart  0-20 Units Subcutaneous TID WC   midodrine   20 mg Oral TID WC   multivitamin  1 tablet Oral QHS   senna-docusate  1 tablet Oral BID   sodium chloride  flush  3 mL Intravenous Q12H   sodium chloride  flush  5 mL Intracatheter Q8H   sodium chloride  flush  5 mL Intracatheter Q8H   thiamine   100 mg Oral Daily

## 2023-09-29 NOTE — Progress Notes (Incomplete)
 Nutrition Follow-up  DOCUMENTATION CODES:   Not applicable  INTERVENTION:  *** Encourage PO intake  House trays per wife requests  Assist with feeding pt Ensure Enlive po BID, each supplement provides 350 kcal and 20 grams of protein. Magic cup TID with meals, each supplement provides 290 kcal and 9 grams of protein MVI daily 100 mg Thiamine  daily   NUTRITION DIAGNOSIS:   Inadequate oral intake related to inability to eat as evidenced by NPO status.  ***  GOAL:   Patient will meet greater than or equal to 90% of their needs  ***  MONITOR:   Skin, I & O's, Labs, Weight trends  REASON FOR ASSESSMENT:   Consult Diet education, Enteral/tube feeding initiation and management (change TF to Nepro)  ASSESSMENT:   78 y.o. male presented to the ED with RLQ abdominal pain x 3 days. PMH includes CHF, CAD s/p CABG, HTN, HLD,T2DM, dementia, G-tube and CKD IV. Pt admitted with appendicitis with possible abscess.  ***   NUTRITION - FOCUSED PHYSICAL EXAM:  {RD Focused Exam List:21252}  Diet Order:   Diet Order             Diet renal with fluid restriction Fluid restriction: 1200 mL Fluid; Room service appropriate? Yes; Fluid consistency: Thin  Diet effective now                   EDUCATION NEEDS:   No education needs have been identified at this time  Skin:  Skin Assessment: Reviewed RN Assessment  Last BM:  09/18/23 type 6  Height:   Ht Readings from Last 1 Encounters:  09/16/23 5\' 9"  (1.753 m)    Weight:   Wt Readings from Last 1 Encounters:  09/29/23 79 kg    Ideal Body Weight:  72.7 kg  BMI:  Body mass index is 25.72 kg/m.  Estimated Nutritional Needs:   Kcal:  2150-2350  Protein:  115-135 grams  Fluid:  UOP + 1L    ***

## 2023-09-30 ENCOUNTER — Inpatient Hospital Stay (HOSPITAL_COMMUNITY)

## 2023-09-30 DIAGNOSIS — K3580 Unspecified acute appendicitis: Secondary | ICD-10-CM | POA: Diagnosis not present

## 2023-09-30 LAB — VITAMIN B12: Vitamin B-12: 2536 pg/mL — ABNORMAL HIGH (ref 180–914)

## 2023-09-30 LAB — RETICULOCYTES
Immature Retic Fract: 30.5 % — ABNORMAL HIGH (ref 2.3–15.9)
RBC.: 2.74 MIL/uL — ABNORMAL LOW (ref 4.22–5.81)
Retic Count, Absolute: 157.3 10*3/uL (ref 19.0–186.0)
Retic Ct Pct: 5.7 % — ABNORMAL HIGH (ref 0.4–3.1)

## 2023-09-30 LAB — GLUCOSE, CAPILLARY
Glucose-Capillary: 97 mg/dL (ref 70–99)
Glucose-Capillary: 97 mg/dL (ref 70–99)
Glucose-Capillary: 115 mg/dL — ABNORMAL HIGH (ref 70–99)
Glucose-Capillary: 106 mg/dL — ABNORMAL HIGH (ref 70–99)

## 2023-09-30 LAB — RENAL FUNCTION PANEL
Albumin: 2.3 g/dL — ABNORMAL LOW (ref 3.5–5.0)
Anion gap: 15 (ref 5–15)
BUN: 21 mg/dL (ref 8–23)
CO2: 25 mmol/L (ref 22–32)
Calcium: 9.3 mg/dL (ref 8.9–10.3)
Chloride: 94 mmol/L — ABNORMAL LOW (ref 98–111)
Creatinine, Ser: 4.44 mg/dL — ABNORMAL HIGH (ref 0.61–1.24)
GFR, Estimated: 13 mL/min — ABNORMAL LOW (ref >60)
Glucose, Bld: 95 mg/dL (ref 70–99)
Phosphorus: 3.3 mg/dL (ref 2.5–4.6)
Potassium: 3.7 mmol/L (ref 3.5–5.1)
Sodium: 134 mmol/L — ABNORMAL LOW (ref 135–145)

## 2023-09-30 LAB — CBC
HCT: 28.2 % — ABNORMAL LOW (ref 39.0–52.0)
Hemoglobin: 8.9 g/dL — ABNORMAL LOW (ref 13.0–17.0)
MCH: 32.5 pg (ref 26.0–34.0)
MCHC: 31.6 g/dL (ref 30.0–36.0)
MCV: 102.9 fL — ABNORMAL HIGH (ref 80.0–100.0)
Platelets: 353 10*3/uL (ref 150–400)
RBC: 2.74 MIL/uL — ABNORMAL LOW (ref 4.22–5.81)
RDW: 17.1 % — ABNORMAL HIGH (ref 11.5–15.5)
WBC: 13.8 10*3/uL — ABNORMAL HIGH (ref 4.0–10.5)
nRBC: 0.1 % (ref 0.0–0.2)

## 2023-09-30 LAB — IRON AND TIBC
Iron: 54 ug/dL (ref 45–182)
Saturation Ratios: 25 % (ref 17.9–39.5)
TIBC: 220 ug/dL — ABNORMAL LOW (ref 250–450)
UIBC: 166 ug/dL

## 2023-09-30 LAB — URIC ACID: Uric Acid, Serum: 4.2 mg/dL (ref 3.7–8.6)

## 2023-09-30 LAB — FERRITIN: Ferritin: 1725 ng/mL — ABNORMAL HIGH (ref 24–336)

## 2023-09-30 LAB — FOLATE: Folate: 26.5 ng/mL (ref >5.9)

## 2023-09-30 MED ORDER — IOHEXOL 350 MG/ML SOLN
60.0000 mL | Freq: Once | INTRAVENOUS | Status: AC | PRN
  Administered 2023-09-30: 60 mL via INTRAVENOUS

## 2023-09-30 MED ORDER — DIPHENHYDRAMINE HCL 25 MG PO CAPS
25.0000 mg | ORAL_CAPSULE | Freq: Four times a day (QID) | ORAL | Status: DC | PRN
  Administered 2023-09-30 – 2023-10-01 (×3): 25 mg via ORAL
  Filled 2023-09-30 (×3): qty 1

## 2023-09-30 NOTE — Progress Notes (Signed)
 Occupational Therapy Treatment Patient Details Name: Christian [name] MRN: [id number] DOB: Aug 29, 1945 Today's Date: 09/30/2023   History of present illness 78 y.o. male admitted 08/30/23 with RLQ pain, appendicitis and perforated appendix s/p laparoscopic appendectomy 5/6. Post op ileus. 5/23 Afib. PMHx: CAD s/p CABG, HTN, CKD, DMT2, BPH, multiple sclerosis, neuropathy, mild dementia   OT comments  Pt progressing well towards goals.  Today's session focused on self-feeding and sitting tolerance. Pt requiring assist to hold utensils, and hand over hand to initiate self-feeding, overall mod assist complete. While sitting EOB pt fatiguing fast initially CGA faded to mod assist to main EOB.  Continue to recommend <3 hours of skilled rehab daily to optimize independence levels. Will continue to follow acutely.       If plan is discharge home, recommend the following:  Two people to help with walking and/or transfers;Two people to help with bathing/dressing/bathroom;Assistance with cooking/housework;Direct supervision/assist for medications management;Direct supervision/assist for financial management;Assist for transportation;Help with stairs or ramp for entrance   Equipment Recommendations  Wheelchair (measurements OT);Wheelchair cushion (measurements OT)    Recommendations for Other Services      Precautions / Restrictions Precautions Precautions: Fall Recall of Precautions/Restrictions: Impaired Precaution/Restrictions Comments: Watch stats and BP Restrictions Weight Bearing Restrictions Per Provider Order: No       Mobility Bed Mobility Overal bed mobility: Needs Assistance Bed Mobility: Supine to Sit, Sit to Supine     Supine to sit: Max assist, +2 for safety/equipment, +2 for physical assistance, HOB elevated, Used rails Sit to supine: +2 for physical assistance, +2 for safety/equipment, Total assist   General bed mobility comments: Pt initating legs but overall max +2 to come  EOB, once EOB d/t fatigue levels pt total +2 to return to supine    Transfers Overall transfer level: Needs assistance       General transfer comment: Not safe at this time     Balance Overall balance assessment: Needs assistance Sitting-balance support: No upper extremity supported, Feet supported Sitting balance-Leahy Scale: Poor Sitting balance - Comments: Requiring up to mod assist as fatigue sets in to maintain sitting EOB Postural control: Posterior lean         ADL either performed or assessed with clinical judgement   ADL Overall ADL's : Needs assistance/impaired Eating/Feeding: Moderate assistance;Sitting Eating/Feeding Details (indicate cue type and reason): Sitting EOB, assist to hold utentsils and iniate hand to mouth movements       General ADL Comments: Difficulty initiating tasks, requiring increased time    Extremity/Trunk Assessment Upper Extremity Assessment Upper Extremity Assessment: Generalized weakness (intention tremors)   Lower Extremity Assessment Lower Extremity Assessment: Defer to PT evaluation        Vision       Perception Perception Perception: Not tested   Praxis Praxis Praxis: Impaired Praxis Impairment Details: Initiation;Motor planning Praxis-Other Comments: Pt with intention trmors, difficulty motor planning and initating tasks, benefitting from hand over hand assist   Communication Communication Communication: Impaired Factors Affecting Communication: Difficulty expressing self;Reduced clarity of speech   Cognition Arousal: Alert Behavior During Therapy: Flat affect Cognition: Cognition impaired, History of cognitive impairments     Awareness: Intellectual awareness impaired, Online awareness impaired Memory impairment (select all impairments): Short-term memory, Working memory Attention impairment (select first level of impairment): Focused attention Executive functioning impairment (select all impairments):  Initiation, Sequencing, Reasoning OT - Cognition Comments: Pt only oriented to self, requiring frequent reorientation to place and self. Following simple commands, but requires assist to initate  Following commands: Impaired Following commands impaired: Follows one step commands with increased time      Cueing   Cueing Techniques: Verbal cues, Gestural cues, Tactile cues        General Comments VSS on RA    Pertinent Vitals/ Pain       Pain Assessment Pain Assessment: Faces Faces Pain Scale: Hurts a little bit Pain Location: back Pain Descriptors / Indicators: Discomfort Pain Intervention(s): Limited activity within patient's tolerance   Frequency  Min 2X/week        Progress Toward Goals  OT Goals(current goals can now be found in the care plan section)  Progress towards OT goals: Progressing toward goals  Acute Rehab OT Goals Patient Stated Goal: To see wife OT Goal Formulation: With patient Time For Goal Achievement: 10/04/23 Potential to Achieve Goals: Fair ADL Goals Pt Will Perform Upper Body Bathing: with modified independence;sitting Pt Will Perform Lower Body Bathing: with min assist;sitting/lateral leans Pt Will Transfer to Toilet: with min assist;stand pivot transfer;bedside commode Additional ADL Goal #1: Pt will be able to sit at EOB for 5 mins to prepare for OOB activity.  Plan      Co-evaluation    PT/OT/SLP Co-Evaluation/Treatment: Yes Reason for Co-Treatment: For patient/therapist safety;To address functional/ADL transfers   OT goals addressed during session: ADL's and self-care      AM-PAC OT "6 Clicks" Daily Activity     Outcome Measure   Help from another person eating meals?: A Lot Help from another person taking care of personal grooming?: A Little Help from another person toileting, which includes using toliet, bedpan, or urinal?: Total Help from another person bathing (including washing, rinsing, drying)?: A  Lot Help from another person to put on and taking off regular upper body clothing?: A Little Help from another person to put on and taking off regular lower body clothing?: Total 6 Click Score: 12    End of Session    OT Visit Diagnosis: Unsteadiness on feet (R26.81);Other abnormalities of gait and mobility (R26.89);Repeated falls (R29.6);Muscle weakness (generalized) (M62.81);Pain Pain - Right/Left:  (abdomen)   Activity Tolerance Patient limited by fatigue   Patient Left in bed;with call bell/phone within reach;with bed alarm set   Nurse Communication Mobility status        Time: 1610-9604 OT Time Calculation (min): 24 min  Charges: OT General Charges $OT Visit: 1 Visit OT Treatments $Self Care/Home Management : 8-22 mins  Delmer Ferraris, OT  Acute Rehabilitation Services Office [phone] Secure chat preferred   Mickael Alamo 09/30/2023, 1:18 PM

## 2023-09-30 NOTE — Progress Notes (Signed)
 Christian [name]  ZOX:[id number] DOB: 01-20-1946 DOA: 08/30/2023 PCP: [name], MD    Brief Narrative:  78 year old with a history of CAD status post CABG, CVA, CKD stage IV, HLD, and HTN who underwent laparoscopic appendectomy 08/31/2023 complicated by postoperative ileus requiring NG tube placement.  He subsequently developed acute kidney injury necessitating the initiation of HD.  He has also developed paroxysmal atrial fibrillation with RVR requiring amiodarone  and metoprolol .  Despite his challenging postoperative course he had stabilized, with General Surgery and Cardiology signing off 09/21/2023.  General Surgery had to be reconsulted 5/31 due to persistent abdominal pain with a CT abdomen/pelvis 6/1 revealing an intra-abdominal abscess, for which the patient underwent drain placement in IR 6/2.  Goals of Care:   Code Status: Full Code   DVT prophylaxis: heparin  injection 5,000 Units Start: 09/09/23 1400 Place and maintain sequential compression device Start: 09/01/23 2001 SCDs Start: 08/31/23 0415   Interim Hx: No acute events reported overnight.  Outpatient arrangements for hemodialysis have been completed with plans for patient to start outpatient dialysis Monday, June 9.  Plans are in place for patient to be discharged to SNF for a rehab stay Friday, June 6.  He is afebrile with stable vital signs.  Resting comfortably in bed.  No new complaints today.  Appetite appears to be improving.  Assessment & Plan:  Acute perforated appendicitis status post laparoscopic appendectomy with postoperative abscess Status post laparoscopic appendectomy 08/31/2023 -initially complicated by postoperative ileus, acute kidney failure, and PAF with RVR  -subsequently stabilized, then CT abdomen/pelvis 6/1 noted postoperative intra-abdominal abscess - IR placed drain 6/2 -to complete 7 days of antibiotics to cover for current abscess, with culture thus far suggesting abundant E. 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 will transfuse another unit -hemoglobin has improved nicely over last 24 hours  Newly diagnosed paroxysmal atrial fibrillation with acute RVR Has been transitioned to oral amiodarone  -metoprolol  discontinued due to hypotension - outpatient follow-up with cardiology -heart rate presently well-controlled with patient in NSR  HTN with acute intermittent hypotension Patient has a history of HTN but has suffered with episodes of hypotension during this admission requiring initiation of midodrine  - BP meds discontinued -blood pressure presently stable  DM2 CBG presently well controlled  CAD status post CABG Asymptomatic  Acute left foot pain Exam without findings specifically with no wound at the heel and no erythema of the toes or the ankle joint -uric acid normal  Family Communication: Spoke with family at bedside Disposition: Medically stable for SNF placement   Objective: Blood pressure 111/68, pulse (!) 105, temperature 98.3 F (36.8 C), temperature source Oral, resp. rate 18, height 5\' 9"  (1.753 m), weight 74.3 kg, SpO2 100%.  Intake/Output  Summary (Last 24 hours) at 09/30/2023 0815 Last data filed at 09/30/2023 0600 Gross per 24 hour  Intake 255 ml  Output 1525 ml  Net -1270 ml   Filed Weights   09/29/23 0500 09/30/23 0430  Weight: 79 kg 74.3 kg    Examination: General: No acute respiratory distress Lungs: Clear to auscultation bilaterally without wheezes or crackles Cardiovascular: Regular rate and rhythm without murmur gallop or rub normal S1 and S2 Abdomen: Soft, bowel sounds positive, drain in place, no rebound, no mass Extremities: No significant cyanosis, clubbing, or edema bilateral lower extremities -see left foot exam as noted above  CBC: Recent Labs  Lab 09/25/23 0522 09/27/23 1045 09/28/23 0603 09/29/23 0623 09/30/23 0635  WBC 15.9*   < > 14.2* 12.3* 13.8*  NEUTROABS 10.8*  --  10.9* 10.7*  --   HGB 9.1*   < > 7.9* 7.6* 8.9*  HCT 28.0*   < > 25.5* 25.0* 28.2*  MCV 100.4*   < > 103.2* 104.6* 102.9*  PLT 418*   < > 298 311 353   < > = values in this interval not displayed.   Basic Metabolic Panel: Recent Labs  Lab 09/25/23 0522 09/25/23 1610 09/28/23 0603 09/29/23 9604 09/29/23 0624 09/30/23 0635  NA 132*   < > 135 133* 133* 134*  K 4.5   < > 3.7 4.1 4.2 3.7  CL 91*   < > 97* 95* 94* 94*  CO2 22   < > 23 21* 24 25  GLUCOSE 98   < > 97 75 75 95  BUN 61*   < > 27* 39* 41* 21  CREATININE 6.83*   < > 4.43* 6.34* 6.29* 4.44*  CALCIUM  9.0   < > 8.6* 8.8* 8.8* 9.3  MG 2.2  --   --  2.0  --   --   PHOS  --    < > 4.4  --  5.0* 3.3   < > = values in this interval not displayed.   GFR: Estimated Creatinine Clearance: 14.2 mL/min (A) (by C-G formula based on SCr of 4.44 mg/dL (H)).   Scheduled Meds:  amoxicillin-clavulanate  1 tablet Oral Daily   calcium  acetate  667 mg Oral TID WC   Chlorhexidine  Gluconate Cloth  6 each Topical Q0600   darbepoetin (ARANESP ) injection - DIALYSIS  100 mcg Subcutaneous Q Thu-1800   feeding supplement  237 mL Oral BID BM   fluticasone  furoate-vilanterol  1 puff  Inhalation Daily   heparin  injection (subcutaneous)  5,000 Units Subcutaneous Q8H   insulin  aspart  0-20 Units Subcutaneous TID WC   midodrine   20 mg Oral TID WC   multivitamin  1 tablet Oral QHS   senna-docusate  1 tablet Oral BID   sodium chloride  flush  3 mL Intravenous Q12H   sodium chloride  flush  5 mL Intracatheter Q8H   sodium chloride  flush  5 mL Intracatheter Q8H   thiamine   100 mg Oral Daily      LOS: 30 days   Abbe Abate, MD [facility] Office  [phone] Pager - Text Page per Tilford Foley  If 7PM-7AM, please contact night-coverage per Amion 09/30/2023, 8:15 AM

## 2023-09-30 NOTE — Progress Notes (Signed)
 Out Patient Arrangements:   Noted pt may go to rehab at [facility]. Please advise of d/c date. HD clinic needs to be aware   Per Lauraine Polite note from 09-24-23 pt has been accepted at [facility] on MWF 10:40 am chair time. Pt will need to arrive at 9:30 am for first appt to complete paperwork prior to treatment.   Christian [name] HPSS [phone]

## 2023-09-30 NOTE — Progress Notes (Signed)
 Attempted to discontinue drain  RLQ but met resistance so stopped. Dr. Jonelle Neri notified.

## 2023-09-30 NOTE — Progress Notes (Signed)
 [facility] Progress Note   Assessment/ Plan:    Expand All Collapse All   [facility] NE[facility] PROGRESS NOTE   Assessment/ Plan: Pt is a 78 y.o. yo male with CAD status post CABG, underwent laparoscopic appendectomy on 5/7 developed postop ileus, AKI on CKD.   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  UA with pyuria and kidney ultrasound without hydronephrosis. No sign of renal recovery with worsening azotemia. First HD on 5/14 Had HD in chair 6/4 - tolerated, next tomorrow Has MWF OP spot [facility])- on schedule here now   # Vascular dialysis access: Patient has old AV fistula which was stenosed therefore the patient underwent fistulogram and angioplasty on 5/13.  On 5/14, the AV fistula is unable to cannulate even with small needle 17G.   TDC  with [facility], 5/22. [facility] to follow as an outpatient regarding his AV fistula   # Appendicitis with possible abscess and postop ileus: s/p  antibiotics and drain per surgical team.  Another drain placed today   # Acute blood loss anemia, CT angio negative for bleeding.  Transfuse as needed.  Started ESA 5/22.  No IV iron until infectious issues clearly resolved.     # CKD-MBD, hyperphosphatemia: Phosphorus stable and with poor p.o. is off sevelamer , PTH level 337, continue to monitor.   # Hypokalemia: Managed with HD, monitor lab.    OK for d/c from nephrology perspective.  Adrian Alba MD [facility] Pager [phone]   Subjective:    Seen in room this AM.  Tolerated HD fine yesterday  Feeling fine.  No new issues.    Objective:   BP 110/64   Pulse 96   Temp 97.9 F (36.6 C) (Oral)   Resp 16   Ht 5\' 9"  (1.753 m)   Wt 74.3 kg   SpO2 100%   BMI 24.19 kg/m   Physical Exam: Gen:NAD, sleeping comfortable CVS:RRR Resp:clear ant  Abd: soft. Slightly  distended NUU:VOZDG LE edema ACCESS: TDC and L AVF + T/B but has infiltration  Labs: BMET Recent Labs  Lab 09/24/23 6440 09/25/23 0522 09/25/23 3474 09/26/23 2595 09/27/23 1045 09/28/23 0603 09/29/23 6387 09/29/23 0624 09/30/23 0635  NA 132*   < > 131* 134* 131* 135 133* 133* 134*  K 4.2   < > 4.5 4.2 4.4 3.7 4.1 4.2 3.7  CL 94*   < > 91* 92* 93* 97* 95* 94* 94*  CO2 20*   < > 22 23 24 23  21* 24 25  GLUCOSE 92   < > 99 90 115* 97 75 75 95  BUN 42*   < > 61* 36* 49* 27* 39* 41* 21  CREATININE 5.11*   < > 6.74* 4.74* 6.87* 4.43* 6.34* 6.29* 4.44*  CALCIUM  9.5   < > 9.0 9.3 8.7* 8.6* 8.8* 8.8* 9.3  PHOS 7.0*  --  7.4* 4.9* 5.3* 4.4  --  5.0* 3.3   < > = values in this interval not displayed.   CBC Recent Labs  Lab 09/25/23 0522 09/27/23 1045 09/28/23 0603 09/29/23 0623 09/30/23 0635  WBC 15.9* 15.7* 14.2* 12.3* 13.8*  NEUTROABS 10.8*  --  10.9* 10.7*  --   HGB 9.1* 8.1* 7.9* 7.6* 8.9*  HCT 28.0* 26.3* 25.5* 25.0* 28.2*  MCV 100.4* 103.1* 103.2* 104.6* 102.9*  PLT 418* 375 298 311  353      Medications:     amoxicillin-clavulanate  1 tablet Oral Daily   calcium  acetate  667 mg Oral TID WC   Chlorhexidine  Gluconate Cloth  6 each Topical Q0600   darbepoetin (ARANESP ) injection - DIALYSIS  100 mcg Subcutaneous Q Thu-1800   feeding supplement  237 mL Oral BID BM   fluticasone  furoate-vilanterol  1 puff Inhalation Daily   heparin  injection (subcutaneous)  5,000 Units Subcutaneous Q8H   insulin  aspart  0-20 Units Subcutaneous TID WC   midodrine   20 mg Oral TID WC   multivitamin  1 tablet Oral QHS   senna-docusate  1 tablet Oral BID   sodium chloride  flush  3 mL Intravenous Q12H   thiamine   100 mg Oral Daily

## 2023-09-30 NOTE — Progress Notes (Signed)
 Informed by RN that order was placed to remove drain (unsure by who).  She attempted to remove the IR drain but stopped when she met resistance.  Will obtain a CT scan to evaluate position of the drain.   Harry Bark S Kaylib Furness PA-C 09/30/2023 3:50 PM

## 2023-09-30 NOTE — Progress Notes (Signed)
 Attempted to remove the patient's JP drain with Ephraim Hash, LPN. Sniped the suture holding tubing in place and attempted to pull tubing out but meeting resistance causing the patient pain. Decided to redress the site and tape tubing in place to have it reevaluated.

## 2023-09-30 NOTE — Plan of Care (Signed)
  Problem: Coping: Goal: Ability to adjust to condition or change in health will improve Outcome: Progressing   Problem: Skin Integrity: Goal: Risk for impaired skin integrity will decrease Outcome: Progressing   Problem: Pain Managment: Goal: General experience of comfort will improve and/or be controlled Outcome: Progressing   Problem: Safety: Goal: Ability to remain free from injury will improve Outcome: Progressing   Problem: Skin Integrity: Goal: Risk for impaired skin integrity will decrease Outcome: Progressing

## 2023-09-30 NOTE — Plan of Care (Signed)
  Problem: Education: Goal: Ability to describe self-care measures that may prevent or decrease complications (Diabetes Survival Skills Education) will improve Outcome: Progressing Goal: Individualized Educational Video(s) Outcome: Progressing   Problem: Coping: Goal: Ability to adjust to condition or change in health will improve Outcome: Progressing   Problem: Fluid Volume: Goal: Ability to maintain a balanced intake and output will improve Outcome: Progressing   Problem: Health Behavior/Discharge Planning: Goal: Ability to identify and utilize available resources and services will improve Outcome: Progressing Goal: Ability to manage health-related needs will improve Outcome: Progressing   Problem: Metabolic: Goal: Ability to maintain appropriate glucose levels will improve Outcome: Progressing   Problem: Nutritional: Goal: Maintenance of adequate nutrition will improve Outcome: Progressing Goal: Progress toward achieving an optimal weight will improve Outcome: Progressing   Problem: Skin Integrity: Goal: Risk for impaired skin integrity will decrease Outcome: Progressing   Problem: Tissue Perfusion: Goal: Adequacy of tissue perfusion will improve Outcome: Progressing   Problem: Education: Goal: Knowledge of General Education information will improve Description: Including pain rating scale, medication(s)/side effects and non-pharmacologic comfort measures Outcome: Progressing   Problem: Health Behavior/Discharge Planning: Goal: Ability to manage health-related needs will improve Outcome: Progressing   Problem: Clinical Measurements: Goal: Ability to maintain clinical measurements within normal limits will improve Outcome: Progressing Goal: Will remain free from infection Outcome: Progressing Goal: Diagnostic test results will improve Outcome: Progressing Goal: Respiratory complications will improve Outcome: Progressing Goal: Cardiovascular complication will  be avoided Outcome: Progressing   Problem: Activity: Goal: Risk for activity intolerance will decrease Outcome: Progressing   Problem: Nutrition: Goal: Adequate nutrition will be maintained Outcome: Progressing   Problem: Coping: Goal: Level of anxiety will decrease Outcome: Progressing   Problem: Elimination: Goal: Will not experience complications related to bowel motility Outcome: Progressing Goal: Will not experience complications related to urinary retention Outcome: Progressing   Problem: Pain Managment: Goal: General experience of comfort will improve and/or be controlled Outcome: Progressing   Problem: Safety: Goal: Ability to remain free from injury will improve Outcome: Progressing   Problem: Skin Integrity: Goal: Risk for impaired skin integrity will decrease Outcome: Progressing   Problem: Education: Goal: Knowledge of disease and its progression will improve Outcome: Progressing Goal: Individualized Educational Video(s) Outcome: Progressing   Problem: Fluid Volume: Goal: Compliance with measures to maintain balanced fluid volume will improve Outcome: Progressing   Problem: Health Behavior/Discharge Planning: Goal: Ability to manage health-related needs will improve Outcome: Progressing   Problem: Nutritional: Goal: Ability to make healthy dietary choices will improve Outcome: Progressing   Problem: Clinical Measurements: Goal: Complications related to the disease process, condition or treatment will be avoided or minimized Outcome: Progressing   Problem: Education: Goal: Understanding of CV disease, CV risk reduction, and recovery process will improve Outcome: Progressing Goal: Individualized Educational Video(s) Outcome: Progressing   Problem: Activity: Goal: Ability to return to baseline activity level will improve Outcome: Progressing   Problem: Cardiovascular: Goal: Ability to achieve and maintain adequate cardiovascular perfusion  will improve Outcome: Progressing   Problem: Health Behavior/Discharge Planning: Goal: Ability to safely manage health-related needs after discharge will improve Outcome: Progressing

## 2023-09-30 NOTE — Progress Notes (Signed)
 Dr. Elvera Hamilton confirmed that current JP drain doesn't need to be removed. Called IR to check placement of the drain. CT scan of abdomen ordered for 10/01/23.

## 2023-09-30 NOTE — Progress Notes (Signed)
 Physical Therapy Treatment Patient Details Name: Christian [name] MRN: [id number] DOB: 01/17/1946 Today's Date: 09/30/2023   History of Present Illness 78 y.o. male admitted 08/30/23 with RLQ pain, appendicitis and perforated appendix s/p laparoscopic appendectomy 5/6. Post op ileus. 5/23 Afib. PMHx: CAD s/p CABG, HTN, CKD, DMT2, BPH, multiple sclerosis, neuropathy, mild dementia    PT Comments  PT and OT saw pt together, PT focusing on bed mobility and seated balance while OT focused on feeding tasks. Pt requiring max +2 assist to get to/from EOB, once EOB pt tolerating ~15 minutes EOB sitting with min-mod truncal support to maintain upright. Pt tending towards posterior truncal bias with fatigue with very little awareness, max cues for upright sitting for core engagement. Pt is confused throughout session, asking repeatedly "what happened to me?" And reoriented repeatedly. Pt post-acute plan remains appropriate.       If plan is discharge home, recommend the following: Assist for transportation;Help with stairs or ramp for entrance;Supervision due to cognitive status;Two people to help with walking and/or transfers;Two people to help with bathing/dressing/bathroom   Can travel by private vehicle        Equipment Recommendations  Hospital bed;BSC/3in1;Wheelchair cushion (measurements PT);Wheelchair (measurements PT)    Recommendations for Other Services       Precautions / Restrictions Precautions Precautions: Fall Recall of Precautions/Restrictions: Impaired Restrictions Weight Bearing Restrictions Per Provider Order: No     Mobility  Bed Mobility Overal bed mobility: Needs Assistance Bed Mobility: Supine to Sit, Sit to Supine     Supine to sit: Max assist, +2 for safety/equipment, +2 for physical assistance, HOB elevated, Used rails Sit to supine: +2 for physical assistance, +2 for safety/equipment, Total assist   General bed mobility comments: Pt initating legs but overall  max +2 to come EOB, once EOB d/t fatigue levels pt total +2 to return to supine    Transfers Overall transfer level: Needs assistance                 General transfer comment: fatigue after sitting EOB    Ambulation/Gait                   Stairs             Wheelchair Mobility     Tilt Bed    Modified Rankin (Stroke Patients Only)       Balance Overall balance assessment: Needs assistance Sitting-balance support: No upper extremity supported, Feet supported Sitting balance-Leahy Scale: Poor Sitting balance - Comments: Requiring up to mod assist as fatigue sets in to maintain sitting EOB Postural control: Posterior lean                                  Communication Communication Communication: Impaired Factors Affecting Communication: Difficulty expressing self;Reduced clarity of speech  Cognition Arousal: Alert Behavior During Therapy: Flat affect   PT - Cognitive impairments: Problem solving, Initiation, No family/caregiver present to determine baseline, Awareness, Safety/Judgement                       PT - Cognition Comments: pt saying repeatedly "what's happened? what happened to me?", PT reoriented pt to hospital setting Following commands: Impaired Following commands impaired: Follows one step commands with increased time    Cueing Cueing Techniques: Verbal cues, Gestural cues, Tactile cues  Exercises      General Comments General comments (skin integrity,  edema, etc.): VSS on 3LO2, min DOE with fatigue EOB but recovered with return to supine      Pertinent Vitals/Pain Pain Assessment Pain Assessment: Faces Faces Pain Scale: Hurts a little bit Pain Location: back Pain Descriptors / Indicators: Discomfort, Other (Comment) (mostly due to itching) Pain Intervention(s): Limited activity within patient's tolerance, Monitored during session, Repositioned    Home Living                           Prior Function            PT Goals (current goals can now be found in the care plan section) Acute Rehab PT Goals Patient Stated Goal: to have less pain when moving PT Goal Formulation: With patient/family Time For Goal Achievement: 10/14/23 Potential to Achieve Goals: Fair Progress towards PT goals: Progressing toward goals    Frequency    Min 1X/week      PT Plan      Co-evaluation PT/OT/SLP Co-Evaluation/Treatment: Yes Reason for Co-Treatment: For patient/therapist safety;To address functional/ADL transfers PT goals addressed during session: Mobility/safety with mobility;Balance OT goals addressed during session: ADL's and self-care      AM-PAC PT "6 Clicks" Mobility   Outcome Measure  Help needed turning from your back to your side while in a flat bed without using bedrails?: A Lot Help needed moving from lying on your back to sitting on the side of a flat bed without using bedrails?: Total Help needed moving to and from a bed to a chair (including a wheelchair)?: Total Help needed standing up from a chair using your arms (e.g., wheelchair or bedside chair)?: Total Help needed to walk in hospital room?: Total Help needed climbing 3-5 steps with a railing? : Total 6 Click Score: 7    End of Session Equipment Utilized During Treatment: Oxygen Activity Tolerance: Patient limited by fatigue Patient left: with call bell/phone within reach;in bed;with family/visitor present;with bed alarm set Nurse Communication: Mobility status PT Visit Diagnosis: Other abnormalities of gait and mobility (R26.89);Muscle weakness (generalized) (M62.81)     Time: 8469-6295 PT Time Calculation (min) (ACUTE ONLY): 24 min  Charges:    $Therapeutic Activity: 8-22 mins PT General Charges $$ ACUTE PT VISIT: 1 Visit                     Shirlene Doughty, PT DPT Acute Rehabilitation Services Secure Chat Preferred  Office [phone]    Hetal Proano Cydney Draft 09/30/2023, 2:46 PM

## 2023-09-30 NOTE — TOC Progression Note (Signed)
 Transition of Care [facility]) - Progression Note    Patient Details  Name: Christian [name] MRN: [id number] Date of Birth: 10/24/1945  Transition of Care [facility]) CM/SW Contact  Carmon Christen, LCSWA Phone Number: 09/30/2023, 11:18 AM  Clinical Narrative:     CSW restarted patients insurance authorization. Insurance authorization currently pending pending [employer] [id number] . Patient has SNF bed at [facility]. CSW will continue to follow.   Expected Discharge Plan: Skilled Nursing Facility Barriers to Discharge: Continued Medical Work up, English as a second language teacher  Expected Discharge Plan and Services In-house Referral: Clinical Social Work   Post Acute Care Choice: Skilled Nursing Facility Living arrangements for the past 2 months: Single Family Home                                       Social Determinants of Health (SDOH) Interventions SDOH Screenings   Food Insecurity: No Food Insecurity (08/31/2023)  Housing: Low Risk  (08/31/2023)  Transportation Needs: No Transportation Needs (08/31/2023)  Utilities: Not At Risk (08/31/2023)  Alcohol Screen: Low Risk  (07/23/2021)  Depression (PHQ2-9): Low Risk  (01/18/2023)  Financial Resource Strain: Low Risk  (07/23/2021)  Physical Activity: Inactive (07/23/2021)  Social Connections: Moderately Integrated (08/31/2023)  Stress: No Stress Concern Present (07/23/2021)  Tobacco Use: Medium Risk (09/16/2023)    Readmission Risk Interventions     No data to display

## 2023-10-01 DIAGNOSIS — K3580 Unspecified acute appendicitis: Secondary | ICD-10-CM | POA: Diagnosis not present

## 2023-10-01 LAB — RENAL FUNCTION PANEL
Albumin: 2.3 g/dL — ABNORMAL LOW (ref 3.5–5.0)
Anion gap: 16 — ABNORMAL HIGH (ref 5–15)
BUN: 27 mg/dL — ABNORMAL HIGH (ref 8–23)
CO2: 25 mmol/L (ref 22–32)
Calcium: 9.5 mg/dL (ref 8.9–10.3)
Chloride: 92 mmol/L — ABNORMAL LOW (ref 98–111)
Creatinine, Ser: 6.58 mg/dL — ABNORMAL HIGH (ref 0.61–1.24)
GFR, Estimated: 8 mL/min — ABNORMAL LOW (ref >60)
Glucose, Bld: 90 mg/dL (ref 70–99)
Phosphorus: 3.8 mg/dL (ref 2.5–4.6)
Potassium: 3.8 mmol/L (ref 3.5–5.1)
Sodium: 133 mmol/L — ABNORMAL LOW (ref 135–145)

## 2023-10-01 LAB — GLUCOSE, CAPILLARY
Glucose-Capillary: 95 mg/dL (ref 70–99)
Glucose-Capillary: 85 mg/dL (ref 70–99)
Glucose-Capillary: 126 mg/dL — ABNORMAL HIGH (ref 70–99)

## 2023-10-01 MED ORDER — DIPHENHYDRAMINE HCL 25 MG PO CAPS: 25.0000 mg | ORAL_CAPSULE | Freq: Four times a day (QID) | ORAL | Status: DC | PRN

## 2023-10-01 MED ORDER — DARBEPOETIN ALFA 100 MCG/0.5ML IJ SOSY: 100.0000 ug | PREFILLED_SYRINGE | INTRAMUSCULAR | Status: DC

## 2023-10-01 MED ORDER — CALCIUM ACETATE (PHOS BINDER) 667 MG PO CAPS: 667.0000 mg | ORAL_CAPSULE | Freq: Three times a day (TID) | ORAL | Status: DC

## 2023-10-01 MED ORDER — OXYCODONE HCL 5 MG PO TABS: 5.0000 mg | ORAL_TABLET | Freq: Four times a day (QID) | ORAL | 0 refills | Status: AC | PRN

## 2023-10-01 MED ORDER — INSULIN ASPART 100 UNIT/ML IJ SOLN: 0.0000 [IU] | Freq: Three times a day (TID) | INTRAMUSCULAR | Status: DC

## 2023-10-01 MED ORDER — SENNOSIDES-DOCUSATE SODIUM 8.6-50 MG PO TABS: 1.0000 | ORAL_TABLET | Freq: Two times a day (BID) | ORAL | Status: DC

## 2023-10-01 MED ORDER — ACETAMINOPHEN 325 MG PO TABS: 650.0000 mg | ORAL_TABLET | Freq: Four times a day (QID) | ORAL | Status: DC | PRN

## 2023-10-01 MED ORDER — HEPARIN SODIUM (PORCINE) 1000 UNIT/ML IJ SOLN
INTRAMUSCULAR | Status: AC
  Filled 2023-10-01: qty 4

## 2023-10-01 MED ORDER — RENA-VITE PO TABS: 1.0000 | ORAL_TABLET | Freq: Every day | ORAL | Status: DC

## 2023-10-01 MED ORDER — AMOXICILLIN-POT CLAVULANATE 500-125 MG PO TABS: 1.0000 | ORAL_TABLET | Freq: Every day | ORAL | Status: AC

## 2023-10-01 MED ORDER — MIDODRINE HCL 10 MG PO TABS: 20.0000 mg | ORAL_TABLET | Freq: Three times a day (TID) | ORAL | Status: DC

## 2023-10-01 MED ORDER — HEPARIN SODIUM (PORCINE) 1000 UNIT/ML IJ SOLN
3800.0000 [IU] | Freq: Once | INTRAMUSCULAR | Status: AC
  Administered 2023-10-01: 3800 [IU]

## 2023-10-01 NOTE — Plan of Care (Signed)

## 2023-10-01 NOTE — TOC Transition Note (Signed)
 Transition of Care [facility]) - Discharge Note   Patient Details  Name: Christian [name] MRN: [id number] Date of Birth: Sep 03, 1945  Transition of Care [facility]) CM/SW Contact:  Tandy Fam, LCSW Phone Number: 10/01/2023, 3:35 PM   Clinical Narrative:   CSW updated by MD that patient stable for discharge after dialysis today, which will be late. CSW confirmed with Frosty Jews that patient can admit after HD is completed, anticipated around 7 pm. CSW spoke with spouse, Christian [name], she was frustrated that she had not been told about patient discharging today. CSW apologized, and spouse in agreement with discharge. Transport arranged with [facility] for after 7 pm.  Nurse to call report to [phone], Room 115. If HD runs later than 7, [facility] can be contacted at [phone].    Final next level of care: Skilled Nursing Facility Barriers to Discharge: Barriers Resolved   Patient Goals and CMS Choice Patient states their goals for this hospitalization and ongoing recovery are:: SNF          Discharge Placement              Patient [facility] bed at: [facility] Patient to be transferred to facility by: [facility] Name of family member notified: Christian [name] Patient and family notified of of transfer: 10/01/23  Discharge Plan and Services Additional resources added to the After Visit Summary for   In-house Referral: Clinical Social Work   Post Acute Care Choice: Skilled Nursing Facility                               Social Drivers of Health (SDOH) Interventions SDOH Screenings   Food Insecurity: No Food Insecurity (08/31/2023)  Housing: Low Risk  (08/31/2023)  Transportation Needs: No Transportation Needs (08/31/2023)  Utilities: Not At Risk (08/31/2023)  Alcohol Screen: Low Risk  (07/23/2021)  Depression (PHQ2-9): Low Risk  (01/18/2023)  Financial Resource Strain: Low Risk  (07/23/2021)  Physical Activity: Inactive (07/23/2021)  Social Connections: Moderately Integrated (08/31/2023)   Stress: No Stress Concern Present (07/23/2021)  Tobacco Use: Medium Risk (09/16/2023)     Readmission Risk Interventions     No data to display

## 2023-10-01 NOTE — Progress Notes (Signed)
 Pt returned to floor from CT.

## 2023-10-01 NOTE — Progress Notes (Signed)
 Call [facility] to confirm pt is on list for pick up at [phone]. Pt is on list for 2100 pick up. Also called pt's spouse to let her know pt is still in HD, and is scheduled to be transported approx 2100. Pt's spouse said OK and she still wants to be notified when [facility] arrives to transport pt to [place]. I will communicate that to receiving nurse, Onalee Bien, RN

## 2023-10-01 NOTE — Discharge Summary (Signed)
 DISCHARGE SUMMARY  Christian [name]  MR#: [id number]  DOB:[birth year]  Date of Admission: 08/30/2023 Date of Discharge: 10/01/2023  Attending Physician:Mekiah Cambridge Constantine Delude, MD  Patient's EPP:IRJJOACZ, Christian Signs, MD  Disposition: D/C to SNF for rehab stay   Follow-up Appts:  Follow-up Information     [facility] Follow up in 1 month(s).   Specialty: Vascular Surgery Contact information: [address], [address] [place]  [address] [phone]        [facility]. Go on 09/29/2023.   Why: Schedule is Monday, Wednesday, Friday wtih 10:40 am chair time.  On Wednesday (6/4), please arrive at 9:30 am to complete paperwork prior to treatment.  Please send hoyer pad with pt to HD appointments. Contact information: [address] [place] [address] [phone]         Maczis, Loura Rower, PA-C. Go on 10/18/2023.   Specialty: General Surgery Why: 2:15 PM, please arrive 30 min prior to appointment time. 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 He subsequently developed acute kidney injury necessitating the initiation of HD. He has also  developed paroxysmal atrial fibrillation with RVR requiring amiodarone  and metoprolol . Despite his challenging postoperative course he had stabilized, with General Surgery and Cardiology signing off 09/21/2023. General Surgery had to be reconsulted 5/31 due to persistent abdominal pain with a CT abdomen/pelvis 6/1 revealing an intra-abdominal abscess, for which the patient underwent drain placement in IR 6/2.   Hospital Course:  Acute perforated appendicitis status post laparoscopic appendectomy with postoperative abscess Status post laparoscopic appendectomy 08/31/2023 - initially complicated by postoperative ileus, acute kidney failure, and PAF with RVR  - subsequently stabilized, then CT abdomen/pelvis 6/1 noted postoperative intra-abdominal abscess - IR placed drain 6/2 into abscess cavity - to complete 7 days of augmentin to cover for current abscess, with culture thus far suggesting abundant E. Coli - to follow-up with general surgery office as indicated on discharge paperwork -follow-up CT imaging of abscess 6/5 noted resolution of abscess with drain still within the abscess cavity - drain was subsequently removed by IR on 10/01/23 w/o incident   Postoperative ileus Required management with NG tube initially - has since resolved - tolerating diet without difficulty - moving bowels   AKI on CKD stage IV likely progressed to new ESRD Required initiation of dialysis during this hospital stay - Nephrology following -underwent first HD 5/14 - outpatient MWF HD schedule has been arranged   Vascular dialysis access 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 therefore he underwent fistulogram and angioplasty 5/13 - nonetheless his fistula was unable to be cannulated 5/14, therefore a TDC was placed by [facility] 5/22 - for outpatient follow-up at  [facility]   Acute blood loss anemia on chronic anemia of CKD/ESRD Required 1 unit PRBC transfusion during this admission - hemoglobin has improved nicely over the  course of his hospitalization    Newly diagnosed paroxysmal atrial fibrillation with acute RVR Has been transitioned to oral amiodarone  - metoprolol  discontinued due to hypotension - outpatient follow-up with Cardiology - heart rate presently well-controlled with patient in NSR   HTN with acute intermittent hypotension Patient has a history of HTN but has suffered with episodes of hypotension during this admission requiring initiation of midodrine  - BP meds discontinued -blood pressure presently stable   DM2 CBG presently well controlled   CAD status post CABG Asymptomatic   Acute left foot pain Exam without findings specifically with no wound at the heel and no erythema of the toes or the ankle joint - uric acid normal - resolved at time of d/c     Allergies as of 10/01/2023   No Known Allergies      Medication List     STOP taking these medications    albuterol  108 (90 Base) MCG/ACT inhaler Commonly known as: VENTOLIN  HFA   clonazePAM  0.5 MG disintegrating tablet Commonly known as: KLONOPIN    furosemide  80 MG tablet Commonly known as: LASIX    glimepiride  2 MG tablet Commonly known as: AMARYL    hydrALAZINE  100 MG tablet Commonly known as: APRESOLINE    isosorbide  dinitrate 30 MG tablet Commonly known as: ISORDIL    metolazone  5 MG tablet Commonly known as: ZAROXOLYN    metoprolol  succinate 50 MG 24 hr tablet Commonly known as: TOPROL -XL   multivitamin tablet   nitroGLYCERIN  0.4 MG SL tablet Commonly known as: NITROSTAT    potassium chloride  10 MEQ tablet Commonly known as: KLOR-CON    PreviDent  5000 Booster Plus 1.1 % Pste Generic drug: Sodium Fluoride   sertraline  25 MG tablet Commonly known as: ZOLOFT    silodosin 8 MG Caps capsule Commonly known as: RAPAFLO       TAKE these medications    acetaminophen  325 MG tablet Commonly known as: TYLENOL  Take 2 tablets (650 mg total) by mouth every 6 (six) hours as needed for mild pain (pain score 1-3),  fever or headache. What changed: reasons to take this   amoxicillin-clavulanate 500-125 MG tablet Commonly known as: AUGMENTIN Take 1 tablet by mouth daily for 6 days.   atorvastatin  20 MG tablet Commonly known as: LIPITOR Take 1 tablet (20 mg total) by mouth every evening. What changed: when to take this   budesonide -formoterol  160-4.5 MCG/ACT inhaler Commonly known as: SYMBICORT  INHALE 2 PUFFS INTO THE LUNGS TWICE A DAY   calcitRIOL 0.25 MCG capsule Commonly known as: ROCALTROL Take 0.25 mcg by mouth 3 (three) times a week. Take one capsule at dinner after on Mon-Wed-Fri.   calcium  acetate 667 MG capsule Commonly known as: PHOSLO  Take 1 capsule (667 mg total) by mouth 3 (three) times daily with meals.   clopidogrel  75 MG tablet Commonly known as: PLAVIX  Take 1 tablet (75 mg total) by mouth daily.   cromolyn  4 % ophthalmic solution Commonly known as: OPTICROM  Place 1 drop into both eyes every Wednesday. At bedtime   Darbepoetin Alfa  100 MCG/0.5ML Sosy injection Commonly known as: ARANESP  Inject 0.5 mLs (100 mcg total) into the skin every Thursday at 6pm. Start taking on: October 07, 2023   diphenhydrAMINE  25 mg capsule Commonly known as: BENADRYL  Take 1 capsule (25 mg total) by mouth every 6 (six) hours as needed for itching.   docusate sodium  100  MG capsule Commonly known as: Colace Take 1 capsule (100 mg total) by mouth 2 (two) times daily.   ferrous sulfate  325 (65 FE) MG tablet Take 325 mg by mouth daily with breakfast.   insulin  aspart 100 UNIT/ML injection Commonly known as: novoLOG  Inject 0-20 Units into the skin 3 (three) times daily with meals.   midodrine  10 MG tablet Commonly known as: PROAMATINE  Take 2 tablets (20 mg total) by mouth 3 (three) times daily with meals.   multivitamin Tabs tablet Take 1 tablet by mouth at bedtime.   oxyCODONE  5 MG immediate release tablet Commonly known as: Roxicodone  Take 1 tablet (5 mg total) by mouth every 4  (four) hours as needed.   senna-docusate 8.6-50 MG tablet Commonly known as: Senokot-S Take 1 tablet by mouth 2 (two) times daily. What changed:  when to take this reasons to take this        Day of Discharge BP (!) 125/56 (BP Location: Right Arm)   Pulse (!) 106   Temp 98.3 F (36.8 C) (Oral)   Resp 16   Ht 5\' 9"  (1.753 m)   Wt 80.3 kg   SpO2 100%   BMI 26.14 kg/m   Physical Exam: General: No acute respiratory distress Lungs: Clear to auscultation bilaterally without wheezes or crackles Cardiovascular: Regular rate and rhythm without murmur gallop or rub normal S1 and S2 Abdomen: Nontender, nondistended, soft, bowel sounds positive, no rebound, no ascites, no appreciable mass Extremities: No significant cyanosis, clubbing, or edema bilateral lower extremities  Basic Metabolic Panel: Recent Labs  Lab 09/25/23 0522 09/25/23 0523 09/27/23 1045 09/28/23 0603 09/29/23 1610 09/29/23 0624 09/30/23 0635 10/01/23 0653  NA 132*   < > 131* 135 133* 133* 134* 133*  K 4.5   < > 4.4 3.7 4.1 4.2 3.7 3.8  CL 91*   < > 93* 97* 95* 94* 94* 92*  CO2 22   < > 24 23 21* 24 25 25   GLUCOSE 98   < > 115* 97 75 75 95 90  BUN 61*   < > 49* 27* 39* 41* 21 27*  CREATININE 6.83*   < > 6.87* 4.43* 6.34* 6.29* 4.44* 6.58*  CALCIUM  9.0   < > 8.7* 8.6* 8.8* 8.8* 9.3 9.5  MG 2.2  --   --   --  2.0  --   --   --   PHOS  --    < > 5.3* 4.4  --  5.0* 3.3 3.8   < > = values in this interval not displayed.    CBC: Recent Labs  Lab 09/25/23 0522 09/27/23 1045 09/28/23 0603 09/29/23 0623 09/30/23 0635  WBC 15.9* 15.7* 14.2* 12.3* 13.8*  NEUTROABS 10.8*  --  10.9* 10.7*  --   HGB 9.1* 8.1* 7.9* 7.6* 8.9*  HCT 28.0* 26.3* 25.5* 25.0* 28.2*  MCV 100.4* 103.1* 103.2* 104.6* 102.9*  PLT 418* 375 298 311 353    Time spent in discharge (includes decision making & examination of pt): 35 minutes  10/01/2023, 2:41 PM   Abbe Abate, MD [facility] Office   [phone]

## 2023-10-01 NOTE — Plan of Care (Signed)
 Pharmacy reported that patient already has been discharged however the prescription of Percocet has not been signed. Prescribed Percocet every 6 hour as needed for moderate to severe pain for 5 days.

## 2023-10-01 NOTE — Progress Notes (Signed)
 HD cath was very painful to the point it couldn't be touched. The Pt stated it was itching so he had been "moving" it so that he could scratch it. When I tried to assess the site it was 9/10 on the pain scale. Sent a STAT IV consult in.

## 2023-10-01 NOTE — Progress Notes (Signed)
 Patient arrived back in the unit

## 2023-10-01 NOTE — Progress Notes (Signed)
 Referring Provider(s):  Dr. Hershell Lose  Supervising Physician: Fernando Hoyer  Patient Status:  [facility] - In-pt  Chief Complaint:  RLQ abscess  Brief History:  Christian [name] is a 78 y.o. male who presented to the ED 08/31/23 with RLQ pain and was found to have perforated appendicitis without abscess.   Christian [name] was taken to the OR the same day and underwent laparoscopic appendectomy with [name]. A JP drain was also placed during the procedure.     Recovery was complicated by persistent leukocytosis, ileus and some blood loss requiring PRBCs.   Renal function worsened and HD was started. Christian [name] was seen in IR for placement of a temporary HD catheter 09/08/23.   CT imaging showed a hematoma in the RLQ. Due to low output the surgically placed JP drain was removed 09/15/23.    Christian [name] developed worsening leukocytosis with increased abdominal pain.    CT abdomen/pelvis 09/26/23 IMPRESSION: 1. Interval removal of surgical drain. 2. 3.1 x 2.5 cm focal fluid collection anterior right pelvis. Although relatively well-defined, there is not substantial peripheral enhancement. This collection does appear to communicate with a 5.7 x 2.0 cm rim enhancing complex fluid collection in the upper right pelvis in the region of the appendectomy bed. This larger rim enhancing collection corresponds to the location of the 7.2 x 5.3 cm complex collection seen on the previous exam. Tiny gas bubbles are contained within the rim enhancing fluid collection. Imaging features are compatible with abscess. Bowel leak not entirely excluded. 3. Fluid collection in the rectovesical pouch with adjacent peritoneal enhancement. Phlegmon/Evolving abscess a concern.   Christian [name] underwent image-guided drain placement by Dr. Burna Carrier on  09/27/23.  The drain was inadvertently pulled yesterday but not removed.  Review of Comprehensive flow sheet showed no output over several days. On exam of the bulb, there was minimal beige tinged  output.  CT scan done yesterday showed resolution of the abscess.  Images reviewed by Dr. Marne Sings. Recommends removal of drain.  Subjective:  Doing well. Lying in bed. No complaints.  Allergies: Patient has no known allergies.  Medications: Prior to Admission medications   Medication Sig Start Date End Date Taking? Authorizing Provider  acetaminophen  (TYLENOL ) 325 MG tablet Take 650 mg by mouth every 6 (six) hours as needed.   Yes [provider]  acetaminophen  (TYLENOL ) 500 MG tablet Take 2 tablets (1,000 mg total) by mouth 4 (four) times daily. 08/31/23 08/30/24 Yes Anda Bamberg, MD  albuterol  (VENTOLIN  HFA) 108 (90 Base) MCG/ACT inhaler TAKE 2 PUFFS BY MOUTH EVERY 6 HOURS AS NEEDED FOR WHEEZE OR SHORTNESS OF BREATH 04/22/21  Yes [name], MD  atorvastatin  (LIPITOR) 20 MG tablet Take 1 tablet (20 mg total) by mouth every evening. Patient taking differently: Take 20 mg by mouth daily after supper. 08/24/23  Yes Knox Perl, MD  budesonide -formoterol  (SYMBICORT ) 160-4.5 MCG/ACT inhaler INHALE 2 PUFFS INTO THE LUNGS TWICE A DAY 12/09/22  Yes Wilfredo Hanly, MD  calcitRIOL (ROCALTROL) 0.25 MCG capsule Take 0.25 mcg by mouth 3 (three) times a week. Take one capsule at dinner after on Mon-Wed-Fri. 07/21/23  Yes [provider]  clonazePAM  (KLONOPIN ) 0.5 MG disintegrating tablet TAKE 1 TABLET BY MOUTH TWICE A DAY 03/15/23  Yes [name], MD  clopidogrel  (PLAVIX ) 75 MG tablet Take 1 tablet (75 mg total) by mouth daily. 06/24/23  Yes Knox Perl, MD  cromolyn  (OPTICROM ) 4 % ophthalmic solution Place 1 drop into both eyes every Wednesday. At  bedtime 12/08/19  Yes [provider]  docusate sodium  (COLACE) 100 MG capsule Take 1 capsule (100 mg total) by mouth 2 (two) times daily. 08/31/23 08/30/24 Yes Lovick, Bard Boor, MD  ferrous sulfate  325 (65 FE) MG tablet Take 325 mg by mouth daily with breakfast.   Yes [provider]  furosemide   (LASIX ) 80 MG tablet Take 120 mg by mouth 2 (two) times daily. Take one and one-half tablet in the morning at 0800 and 1400 daily 11/30/20  Yes [provider]  glimepiride  (AMARYL ) 2 MG tablet TAKE 1 TABLET BY MOUTH DAILY BEFORE BREAKFAST. 08/09/23  Yes [name], MD  hydrALAZINE  (APRESOLINE ) 100 MG tablet TAKE 1 TABLET BY MOUTH 3 TIMES DAILY. 10/12/22  Yes Knox Perl, MD  isosorbide  dinitrate (ISORDIL ) 30 MG tablet TAKE 1 TABLET BY MOUTH 3 TIMES DAILY. 05/03/23  Yes [name], MD  methocarbamol  (ROBAXIN -750) 750 MG tablet Take 1 tablet (750 mg total) by mouth 4 (four) times daily. 08/31/23  Yes Anda Bamberg, MD  metolazone  (ZAROXOLYN ) 5 MG tablet Take 5 mg by mouth 3 (three) times a week. Take one tablet by mouth on Sun-Tues-Thurs before each dose of Furosemide .   Yes [provider]  metoprolol  succinate (TOPROL -XL) 50 MG 24 hr tablet Take 3 tablets (150 mg total) by mouth daily. Take with or immediately following a meal. 05/11/23  Yes Knox Perl, MD  Multiple Vitamin (MULTIVITAMIN) tablet Take 1 tablet by mouth daily.   Yes [provider]  nitroGLYCERIN  (NITROSTAT ) 0.4 MG SL tablet Place 0.4 mg under the tongue every 5 (five) minutes as needed for chest pain. 10/25/19  Yes [provider]  oxyCODONE  (ROXICODONE ) 5 MG immediate release tablet Take 1 tablet (5 mg total) by mouth every 4 (four) hours as needed. 08/31/23  Yes Anda Bamberg, MD  potassium chloride  (KLOR-CON ) 10 MEQ tablet Take 30 mEq by mouth daily. 10/25/20  Yes [provider]  PREVIDENT  5000 BOOSTER PLUS 1.1 % PSTE TAKE 1 APPLICATION BY MOUTH EVERY EVENING. 09/14/22  Yes [name], MD  senna-docusate (SENOKOT-S) 8.6-50 MG tablet Take 1 tablet by mouth at bedtime as needed for mild constipation.   Yes [provider]  sertraline  (ZOLOFT ) 25 MG tablet TAKE 1 TABLET BY MOUTH EVERY DAY 10/26/22  Yes [name], MD  silodosin (RAPAFLO) 8 MG  CAPS capsule Take 1 capsule (8 mg total) by mouth at bedtime. 09/25/21  Yes McKenzie, Arden Beck, MD     Vital Signs: BP (!) 115/56 (BP Location: Right Arm)   Pulse (!) 102   Temp 98.5 F (36.9 C) (Oral)   Resp 16   Ht 5\' 9"  (1.753 m)   Wt 177 lb 0.5 oz (80.3 kg)   SpO2 100%   BMI 26.14 kg/m   Physical Exam Constitutional:      Appearance: Normal appearance.  HENT:     Head: Normocephalic and atraumatic.  Cardiovascular:     Rate and Rhythm: Normal rate.  Pulmonary:     Effort: Pulmonary effort is normal. No respiratory distress.  Abdominal:     Palpations: Abdomen is soft.     Tenderness: There is no abdominal tenderness.  Skin:    General: Skin is warm and dry.  Neurological:     General: No focal deficit present.     Mental Status: Christian [name] is alert and oriented to person, place, and time.  Psychiatric:        Mood and  Affect: Mood normal.        Behavior: Behavior normal.        Thought Content: Thought content normal.        Judgment: Judgment normal.      Labs:  CBC: Recent Labs    09/27/23 1045 09/28/23 0603 09/29/23 0623 09/30/23 0635  WBC 15.7* 14.2* 12.3* 13.8*  HGB 8.1* 7.9* 7.6* 8.9*  HCT 26.3* 25.5* 25.0* 28.2*  PLT 375 298 311 353    COAGS: Recent Labs    09/26/23 0625  INR 1.1    BMP: Recent Labs    09/29/23 0623 09/29/23 0624 09/30/23 0635 10/01/23 0653  NA 133* 133* 134* 133*  K 4.1 4.2 3.7 3.8  CL 95* 94* 94* 92*  CO2 21* 24 25 25   GLUCOSE 75 75 95 90  BUN 39* 41* 21 27*  CALCIUM  8.8* 8.8* 9.3 9.5  CREATININE 6.34* 6.29* 4.44* 6.58*  GFRNONAA 9* 9* 13* 8*    LIVER FUNCTION TESTS: Recent Labs    09/20/23 0758 09/21/23 0219 09/23/23 0415 09/23/23 1304 09/25/23 0522 09/25/23 0523 09/29/23 1610 09/29/23 0624 09/30/23 0635 10/01/23 0653  BILITOT 0.9  --  0.6  --  0.9  --  0.8  --   --   --   AST 30  --  29  --  36  --  36  --   --   --   ALT 21  --  18  --  22  --  27  --   --   --   ALKPHOS 107  --  101  --  98  --   78  --   --   --   PROT 7.0  --  8.0  --  8.2*  --  6.8  --   --   --   ALBUMIN  2.4*   < > 2.3*   < > 2.4*   < > 1.9* 1.9* 2.3* 2.3*   < > = values in this interval not displayed.    Assessment and Plan:  RLQ abscess development after appendectomy.  S/P image-guided drain placement by Dr. Burna Carrier on  09/27/23.  The drain was inadvertently pulled yesterday but not removed.  Review of Comprehensive flow sheet showed no output over several days. On exam of the bulb, there was minimal beige tinged output.  CT scan done yesterday showed resolution of the abscess.  Images reviewed by Dr. Marne Sings. Recommends removal of drain.  Drain was removed without issue and a clean dressing placed.   No need for IR follow up. Ok to remove the dressing in 24 hours.  Electronically Signed: Connor Deiters, PA-C 10/01/2023, 10:45 AM    I spent a total of 15 Minutes at the the patient's bedside AND on the patient's hospital floor or unit, greater than 50% of which was counseling/coordinating care for drain removal.

## 2023-10-01 NOTE — Progress Notes (Signed)
 Patient ID: Christian [name], male   DOB: 1945/05/05, 78 y.o.   MRN: [id number] [facility] Progress Note   Assessment/ Plan:   1.  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  Continue hemodialysis on Monday/Wednesday/Friday schedule with next hemodialysis slated for today.  He has been successfully accepted to [facility] kidney center and can start there upon discharge. 2.  Appendicitis with possible abscess: Status post antibiotics and drain placement.  CT scan from yesterday showed complete drainage of appendiceal cyst and decrease in size of the deep pelvic abscess.  Remains on antibiotic therapy with oral Augmentin. 3.  Anemia: With acute blood loss with negative CT angiogram.  Improved status post PRBC transfusion and will continue ESA at outpatient dialysis. 4.  Metabolic bone disease: Calcium  and phosphorus levels currently at goal, continue to monitor with hemodialysis and resume/begin binder when phosphorus trends up.  Subjective:   Reports improvement of abdominal pain without nausea/vomiting   Objective:   BP (!) 115/56 (BP Location: Right Arm)   Pulse (!) 102   Temp 98.5 F (36.9 C) (Oral)   Resp 16   Ht 5\' 9"  (1.753 m)   Wt 80.3 kg   SpO2 100%   BMI 26.14 kg/m   Intake/Output Summary (Last 24 hours) at 10/01/2023 1036 Last data filed at 09/30/2023 1324 Gross per 24 hour  Intake 120 ml  Output --  Net 120 ml   Weight change: 6 kg  Physical Exam: Gen: Comfortably resting in bed, watching television CVS: Pulse regular tachycardia, S1 and S2 normal.  Right IJ TDC Resp: Poor inspiratory effort with decreased breath sounds over bases, no rales/rhonchi Abd: Soft, mildly distended, bowel sounds normal Ext: No lower extremity edema, poorly palpable left forearm AV fistula.  Imaging: CT ABDOMEN PELVIS W CONTRAST Result Date:  09/30/2023 CLINICAL DATA:  Drainage catheter dislodgement.  Appendiceal abscess EXAM: CT ABDOMEN AND PELVIS WITH CONTRAST TECHNIQUE: Multidetector CT imaging of the abdomen and pelvis was performed using the standard protocol following bolus administration of intravenous contrast. RADIATION DOSE REDUCTION: This exam was performed according to the departmental dose-optimization program which includes automated exposure control, adjustment of the mA and/or kV according to patient size and/or use of iterative reconstruction technique. CONTRAST:  60mL OMNIPAQUE  IOHEXOL  350 MG/ML SOLN COMPARISON:  09/26/2023 FINDINGS: Lower chest: Complex moderate right and small left pleural effusions are again seen at the lung bases, unchanged, with associated pleural thickening calcification. Left basilar rounded atelectasis and right basilar compressive atelectasis are again noted. No acute abnormality. Hepatobiliary: Cholelithiasis without superimposed pericholecystic inflammatory change. Liver unremarkable; no enhancing intrahepatic mass identified. No intra or extrahepatic biliary ductal dilation. Pancreas: Unremarkable Spleen: Unremarkable Adrenals/Urinary Tract: Bilateral adrenal nodular thickening is again noted suggesting changes of adrenal hyperplasia. The kidneys are normal in size and position. Simple cortical cysts are again seen bilaterally for which no follow-up imaging is recommended. The kidneys are otherwise unremarkable. The bladder is unremarkable. Stomach/Bowel: Interval right lower quadrant percutaneous drainage catheter placement with complete evacuation of the multiloculated fluid collection within the right lower quadrant. Small amount of residual inflammatory soft tissue is seen within this region, likely the collapsed abscess cavity. The drainage catheter appears positioned at the margin of this inflammatory collection. Mild sigmoid diverticulosis. The stomach, small bowel, large bowel are otherwise  unremarkable. Surgical changes of appendectomy are identified. No evidence of obstruction or perforation. The remain in  seen fluid collection within the retro vesicular region within the deep pelvis has decreased in size, now measuring 3.0 x 5.2 cm (previously measuring 4.6 x 6.6 cm) compatible with an improving deep pelvic abscess. No free intraperitoneal gas or fluid. Vascular/Lymphatic: Aortic atherosclerosis. No enlarged abdominal or pelvic lymph nodes. Reproductive: Prostate is unremarkable. Other: No abdominal wall hernia Musculoskeletal: Heterotopic ossifications seen at the insertion of the left rectus femoris musculature at the AIIS. Advanced degenerative changes noted within the thoracolumbar spine. No acute bone abnormality. No lytic or blastic bone lesion. IMPRESSION: 1. Interval right lower quadrant percutaneous drainage catheter placement with complete evacuation of the multiloculated abscess cavity. The drainage catheter appears positioned at the margin of the decompressed abscess cavity. 2. Interval decrease in size of a deep pelvic abscess, now measuring 5.2 cm, previously 6.6 cm. 3. Cholelithiasis. 4. Mild sigmoid diverticulosis. 5. Stable complex moderate right and small left pleural effusions with associated pleural thickening and calcification. This may reflect changes of asbestosis, but is not well assessed on this examination. 6. Aortic atherosclerosis. Aortic Atherosclerosis (ICD10-I70.0). Electronically Signed   By: Worthy Heads M.D.   On: 09/30/2023 19:42    Labs: BMET Recent Labs  Lab 09/25/23 0523 09/26/23 1610 09/27/23 1045 09/28/23 0603 09/29/23 9604 09/29/23 0624 09/30/23 0635 10/01/23 0653  NA 131* 134* 131* 135 133* 133* 134* 133*  K 4.5 4.2 4.4 3.7 4.1 4.2 3.7 3.8  CL 91* 92* 93* 97* 95* 94* 94* 92*  CO2 22 23 24 23  21* 24 25 25   GLUCOSE 99 90 115* 97 75 75 95 90  BUN 61* 36* 49* 27* 39* 41* 21 27*  CREATININE 6.74* 4.74* 6.87* 4.43* 6.34* 6.29* 4.44* 6.58*   CALCIUM  9.0 9.3 8.7* 8.6* 8.8* 8.8* 9.3 9.5  PHOS 7.4* 4.9* 5.3* 4.4  --  5.0* 3.3 3.8   CBC Recent Labs  Lab 09/25/23 0522 09/27/23 1045 09/28/23 0603 09/29/23 0623 09/30/23 0635  WBC 15.9* 15.7* 14.2* 12.3* 13.8*  NEUTROABS 10.8*  --  10.9* 10.7*  --   HGB 9.1* 8.1* 7.9* 7.6* 8.9*  HCT 28.0* 26.3* 25.5* 25.0* 28.2*  MCV 100.4* 103.1* 103.2* 104.6* 102.9*  PLT 418* 375 298 311 353    Medications:     amoxicillin-clavulanate  1 tablet Oral Daily   calcium  acetate  667 mg Oral TID WC   Chlorhexidine  Gluconate Cloth  6 each Topical Q0600   darbepoetin (ARANESP ) injection - DIALYSIS  100 mcg Subcutaneous Q Thu-1800   feeding supplement  237 mL Oral BID BM   fluticasone  furoate-vilanterol  1 puff Inhalation Daily   heparin  injection (subcutaneous)  5,000 Units Subcutaneous Q8H   insulin  aspart  0-20 Units Subcutaneous TID WC   midodrine   20 mg Oral TID WC   multivitamin  1 tablet Oral QHS   senna-docusate  1 tablet Oral BID   sodium chloride  flush  3 mL Intravenous Q12H   thiamine   100 mg Oral Daily    Clevester Dally, MD 10/01/2023, 10:36 AM

## 2023-10-01 NOTE — Progress Notes (Signed)
 Received patient in bed.Alert and oriented x 4.Consent verified.  Access used: Right hd catheter that worked well.Dressing on date.  Duration of treatment : 3.5 hours.  Net uf 1.5 liter.  Hemo comment: SBP wer dropping on his last hour of treatment,circuit clogged on his last 30 minutes of treatment thus a net uf of 1.5 liter off from him.  Hand off to the patient's nurse with stable condition via transporter.

## 2023-10-01 NOTE — Progress Notes (Signed)
 Call report to [facility] gave report to receiving nurse, Maurie Southern, LPN at [phone]. Pt currently in HD.

## 2023-10-02 DIAGNOSIS — S0990XA Unspecified injury of head, initial encounter: Secondary | ICD-10-CM | POA: Diagnosis not present

## 2023-10-02 DIAGNOSIS — R4182 Altered mental status, unspecified: Secondary | ICD-10-CM | POA: Diagnosis not present

## 2023-10-02 DIAGNOSIS — Z86711 Personal history of pulmonary embolism: Secondary | ICD-10-CM | POA: Diagnosis not present

## 2023-10-02 DIAGNOSIS — F03A0 Unspecified dementia, mild, without behavioral disturbance, psychotic disturbance, mood disturbance, and anxiety: Secondary | ICD-10-CM | POA: Diagnosis not present

## 2023-10-02 DIAGNOSIS — G8929 Other chronic pain: Secondary | ICD-10-CM | POA: Diagnosis not present

## 2023-10-02 DIAGNOSIS — I255 Ischemic cardiomyopathy: Secondary | ICD-10-CM | POA: Diagnosis not present

## 2023-10-02 DIAGNOSIS — K353 Acute appendicitis with localized peritonitis, without perforation or gangrene: Secondary | ICD-10-CM | POA: Diagnosis not present

## 2023-10-02 DIAGNOSIS — Z955 Presence of coronary angioplasty implant and graft: Secondary | ICD-10-CM | POA: Diagnosis not present

## 2023-10-02 DIAGNOSIS — I4891 Unspecified atrial fibrillation: Secondary | ICD-10-CM | POA: Diagnosis not present

## 2023-10-02 DIAGNOSIS — G35 Multiple sclerosis: Secondary | ICD-10-CM | POA: Diagnosis not present

## 2023-10-02 DIAGNOSIS — N184 Chronic kidney disease, stage 4 (severe): Secondary | ICD-10-CM | POA: Diagnosis not present

## 2023-10-02 DIAGNOSIS — I509 Heart failure, unspecified: Secondary | ICD-10-CM | POA: Diagnosis not present

## 2023-10-02 DIAGNOSIS — E1142 Type 2 diabetes mellitus with diabetic polyneuropathy: Secondary | ICD-10-CM | POA: Diagnosis not present

## 2023-10-02 DIAGNOSIS — R000 Tachycardia, unspecified: Secondary | ICD-10-CM | POA: Diagnosis not present

## 2023-10-02 DIAGNOSIS — E785 Hyperlipidemia, unspecified: Secondary | ICD-10-CM | POA: Diagnosis not present

## 2023-10-02 DIAGNOSIS — W19XXXA Unspecified fall, initial encounter: Secondary | ICD-10-CM | POA: Diagnosis not present

## 2023-10-02 DIAGNOSIS — Z794 Long term (current) use of insulin: Secondary | ICD-10-CM | POA: Diagnosis not present

## 2023-10-02 DIAGNOSIS — Z7901 Long term (current) use of anticoagulants: Secondary | ICD-10-CM | POA: Diagnosis not present

## 2023-10-02 DIAGNOSIS — I130 Hypertensive heart and chronic kidney disease with heart failure and stage 1 through stage 4 chronic kidney disease, or unspecified chronic kidney disease: Secondary | ICD-10-CM | POA: Diagnosis not present

## 2023-10-02 DIAGNOSIS — R2689 Other abnormalities of gait and mobility: Secondary | ICD-10-CM | POA: Diagnosis not present

## 2023-10-02 DIAGNOSIS — R9082 White matter disease, unspecified: Secondary | ICD-10-CM | POA: Diagnosis not present

## 2023-10-02 DIAGNOSIS — E7800 Pure hypercholesterolemia, unspecified: Secondary | ICD-10-CM | POA: Diagnosis not present

## 2023-10-02 DIAGNOSIS — Z743 Need for continuous supervision: Secondary | ICD-10-CM | POA: Diagnosis not present

## 2023-10-02 DIAGNOSIS — E1122 Type 2 diabetes mellitus with diabetic chronic kidney disease: Secondary | ICD-10-CM | POA: Diagnosis not present

## 2023-10-02 DIAGNOSIS — K37 Unspecified appendicitis: Secondary | ICD-10-CM | POA: Diagnosis not present

## 2023-10-02 DIAGNOSIS — Z7401 Bed confinement status: Secondary | ICD-10-CM | POA: Diagnosis not present

## 2023-10-02 DIAGNOSIS — W06XXXA Fall from bed, initial encounter: Secondary | ICD-10-CM | POA: Diagnosis not present

## 2023-10-02 DIAGNOSIS — R1313 Dysphagia, pharyngeal phase: Secondary | ICD-10-CM | POA: Diagnosis not present

## 2023-10-02 DIAGNOSIS — I10 Essential (primary) hypertension: Secondary | ICD-10-CM | POA: Diagnosis not present

## 2023-10-02 DIAGNOSIS — M542 Cervicalgia: Secondary | ICD-10-CM | POA: Diagnosis not present

## 2023-10-02 DIAGNOSIS — E119 Type 2 diabetes mellitus without complications: Secondary | ICD-10-CM | POA: Diagnosis not present

## 2023-10-02 DIAGNOSIS — M6281 Muscle weakness (generalized): Secondary | ICD-10-CM | POA: Diagnosis not present

## 2023-10-02 DIAGNOSIS — R296 Repeated falls: Secondary | ICD-10-CM | POA: Diagnosis not present

## 2023-10-02 DIAGNOSIS — Z8673 Personal history of transient ischemic attack (TIA), and cerebral infarction without residual deficits: Secondary | ICD-10-CM | POA: Diagnosis not present

## 2023-10-02 DIAGNOSIS — R519 Headache, unspecified: Secondary | ICD-10-CM | POA: Diagnosis not present

## 2023-10-02 DIAGNOSIS — S199XXA Unspecified injury of neck, initial encounter: Secondary | ICD-10-CM | POA: Diagnosis not present

## 2023-10-02 DIAGNOSIS — N186 End stage renal disease: Secondary | ICD-10-CM | POA: Diagnosis not present

## 2023-10-02 DIAGNOSIS — I700 Atherosclerosis of aorta: Secondary | ICD-10-CM | POA: Diagnosis not present

## 2023-10-02 DIAGNOSIS — Z992 Dependence on renal dialysis: Secondary | ICD-10-CM | POA: Diagnosis not present

## 2023-10-02 DIAGNOSIS — R531 Weakness: Secondary | ICD-10-CM | POA: Diagnosis not present

## 2023-10-02 DIAGNOSIS — K3580 Unspecified acute appendicitis: Secondary | ICD-10-CM | POA: Diagnosis not present

## 2023-10-02 DIAGNOSIS — L299 Pruritus, unspecified: Secondary | ICD-10-CM | POA: Diagnosis not present

## 2023-10-02 DIAGNOSIS — N400 Enlarged prostate without lower urinary tract symptoms: Secondary | ICD-10-CM | POA: Diagnosis not present

## 2023-10-02 DIAGNOSIS — I959 Hypotension, unspecified: Secondary | ICD-10-CM | POA: Diagnosis not present

## 2023-10-02 DIAGNOSIS — I2510 Atherosclerotic heart disease of native coronary artery without angina pectoris: Secondary | ICD-10-CM | POA: Diagnosis not present

## 2023-10-02 DIAGNOSIS — M47812 Spondylosis without myelopathy or radiculopathy, cervical region: Secondary | ICD-10-CM | POA: Diagnosis not present

## 2023-10-02 DIAGNOSIS — Z87891 Personal history of nicotine dependence: Secondary | ICD-10-CM | POA: Diagnosis not present

## 2023-10-02 DIAGNOSIS — D631 Anemia in chronic kidney disease: Secondary | ICD-10-CM | POA: Diagnosis not present

## 2023-10-02 DIAGNOSIS — Z951 Presence of aortocoronary bypass graft: Secondary | ICD-10-CM | POA: Diagnosis not present

## 2023-10-02 DIAGNOSIS — M4319 Spondylolisthesis, multiple sites in spine: Secondary | ICD-10-CM | POA: Diagnosis not present

## 2023-10-02 DIAGNOSIS — M5030 Other cervical disc degeneration, unspecified cervical region: Secondary | ICD-10-CM | POA: Diagnosis not present

## 2023-10-02 DIAGNOSIS — I4892 Unspecified atrial flutter: Secondary | ICD-10-CM | POA: Diagnosis not present

## 2023-10-02 LAB — CARBAPENEM RESISTANCE PANEL
Carba Resistance IMP Gene: NOT DETECTED
Carba Resistance KPC Gene: NOT DETECTED
Carba Resistance NDM Gene: NOT DETECTED
Carba Resistance OXA48 Gene: NOT DETECTED
Carba Resistance VIM Gene: NOT DETECTED

## 2023-10-02 LAB — AEROBIC/ANAEROBIC CULTURE W GRAM STAIN (SURGICAL/DEEP WOUND): Special Requests: NORMAL

## 2023-10-04 ENCOUNTER — Telehealth: Payer: Self-pay | Admitting: Nurse Practitioner

## 2023-10-04 DIAGNOSIS — E119 Type 2 diabetes mellitus without complications: Secondary | ICD-10-CM | POA: Diagnosis not present

## 2023-10-04 DIAGNOSIS — G8929 Other chronic pain: Secondary | ICD-10-CM | POA: Diagnosis not present

## 2023-10-04 DIAGNOSIS — R531 Weakness: Secondary | ICD-10-CM | POA: Diagnosis not present

## 2023-10-04 DIAGNOSIS — N186 End stage renal disease: Secondary | ICD-10-CM | POA: Diagnosis not present

## 2023-10-04 DIAGNOSIS — I959 Hypotension, unspecified: Secondary | ICD-10-CM | POA: Diagnosis not present

## 2023-10-04 DIAGNOSIS — Z794 Long term (current) use of insulin: Secondary | ICD-10-CM | POA: Diagnosis not present

## 2023-10-04 DIAGNOSIS — E785 Hyperlipidemia, unspecified: Secondary | ICD-10-CM | POA: Diagnosis not present

## 2023-10-04 NOTE — Discharge Planning (Signed)
 [facility]  Initial Hemodialysis Orders  Dialysis center: [facility]  Patient's name: Christian [name] DOB: 1946/04/13 AKI or ESRD: ESRD  Discharge diagnosis: AoCKD 4 now ESRD 2.   Acute perforated appendicitis  Allergies: No Known Allergies  Date of First Dialysis: 09/08/2023 Cause of renal disease: DMT2, HTN  Dialysis Prescription: Dialysis Frequency: T,Th,S Tx duration: 4.0 hrs BFR: 400 DFR: autoflow 1.5  EDW: 77 kg  Dialyzer: 180NRe UF profile/Sodium modeling?: No Dialysis Bath: 2.0 K 2.0 Ca  Dialysis access: Access type: [facility] Date placed: 09/16/2023 Surgeon: Dr. Fulton Job Needle gauge: na  In Center Medications: Heparin  Dose: None  Type: Porcine VDRA: Corrected calcium  10.6 too high for VDRA per protocol Venofer : Per protocol Mircera: Per protocol last ESA 09/30/2023 100 mcg SQ  Discharge labs: Hgb: 8.9 K+: 3.8  Ca: 9.5 Corrected Ca 10.8 Phos: 3.8 Alb: 2.3  Please draw MONTHLY LABS ON ADMIT  Jacobo Masters [facility] [phone]

## 2023-10-04 NOTE — Telephone Encounter (Signed)
 Transition of care contact from inpatient facility  Date of Discharge: 10/01/2023 Date of Contact: 10/04/2023 Method of contact: Phone  Attempted to contact patient to discuss transition of care from inpatient admission. Patient did not answer the phone. Message was left on the patient's voicemail with call back number [phone].

## 2023-10-04 NOTE — Progress Notes (Signed)
 Late Note Entry- October 04, 2023  Contacted [facility] to be advised of pt's d/c to snf on Friday and that pt should start today.   Lauraine Polite Renal Navigator [phone]

## 2023-10-06 DIAGNOSIS — R2689 Other abnormalities of gait and mobility: Secondary | ICD-10-CM | POA: Diagnosis not present

## 2023-10-06 DIAGNOSIS — M6281 Muscle weakness (generalized): Secondary | ICD-10-CM | POA: Diagnosis not present

## 2023-10-06 DIAGNOSIS — K353 Acute appendicitis with localized peritonitis, without perforation or gangrene: Secondary | ICD-10-CM | POA: Diagnosis not present

## 2023-10-06 DIAGNOSIS — I4892 Unspecified atrial flutter: Secondary | ICD-10-CM | POA: Diagnosis not present

## 2023-10-06 DIAGNOSIS — K37 Unspecified appendicitis: Secondary | ICD-10-CM | POA: Diagnosis not present

## 2023-10-06 DIAGNOSIS — Z992 Dependence on renal dialysis: Secondary | ICD-10-CM | POA: Diagnosis not present

## 2023-10-07 DIAGNOSIS — R531 Weakness: Secondary | ICD-10-CM | POA: Diagnosis not present

## 2023-10-13 ENCOUNTER — Encounter (HOSPITAL_COMMUNITY): Payer: Self-pay | Admitting: Emergency Medicine

## 2023-10-13 ENCOUNTER — Emergency Department (HOSPITAL_COMMUNITY)

## 2023-10-13 ENCOUNTER — Emergency Department (HOSPITAL_COMMUNITY)
Admission: EM | Admit: 2023-10-13 | Discharge: 2023-10-13 | Disposition: A | Discharge status: Home / Self Care | Attending: Emergency Medicine | Admitting: Emergency Medicine

## 2023-10-13 DIAGNOSIS — W06XXXA Fall from bed, initial encounter: Secondary | ICD-10-CM | POA: Diagnosis not present

## 2023-10-13 DIAGNOSIS — I2510 Atherosclerotic heart disease of native coronary artery without angina pectoris: Secondary | ICD-10-CM | POA: Diagnosis not present

## 2023-10-13 DIAGNOSIS — Z794 Long term (current) use of insulin: Secondary | ICD-10-CM | POA: Insufficient documentation

## 2023-10-13 DIAGNOSIS — R296 Repeated falls: Secondary | ICD-10-CM | POA: Diagnosis not present

## 2023-10-13 DIAGNOSIS — Z87891 Personal history of nicotine dependence: Secondary | ICD-10-CM | POA: Insufficient documentation

## 2023-10-13 DIAGNOSIS — I509 Heart failure, unspecified: Secondary | ICD-10-CM | POA: Insufficient documentation

## 2023-10-13 DIAGNOSIS — Z951 Presence of aortocoronary bypass graft: Secondary | ICD-10-CM | POA: Diagnosis not present

## 2023-10-13 DIAGNOSIS — E1122 Type 2 diabetes mellitus with diabetic chronic kidney disease: Secondary | ICD-10-CM | POA: Insufficient documentation

## 2023-10-13 DIAGNOSIS — N184 Chronic kidney disease, stage 4 (severe): Secondary | ICD-10-CM | POA: Diagnosis not present

## 2023-10-13 DIAGNOSIS — I130 Hypertensive heart and chronic kidney disease with heart failure and stage 1 through stage 4 chronic kidney disease, or unspecified chronic kidney disease: Secondary | ICD-10-CM | POA: Insufficient documentation

## 2023-10-13 DIAGNOSIS — M47812 Spondylosis without myelopathy or radiculopathy, cervical region: Secondary | ICD-10-CM | POA: Diagnosis not present

## 2023-10-13 DIAGNOSIS — Z992 Dependence on renal dialysis: Secondary | ICD-10-CM | POA: Diagnosis not present

## 2023-10-13 DIAGNOSIS — D631 Anemia in chronic kidney disease: Secondary | ICD-10-CM | POA: Diagnosis not present

## 2023-10-13 DIAGNOSIS — R2689 Other abnormalities of gait and mobility: Secondary | ICD-10-CM | POA: Diagnosis not present

## 2023-10-13 DIAGNOSIS — M6281 Muscle weakness (generalized): Secondary | ICD-10-CM | POA: Diagnosis not present

## 2023-10-13 DIAGNOSIS — K37 Unspecified appendicitis: Secondary | ICD-10-CM | POA: Diagnosis not present

## 2023-10-13 DIAGNOSIS — Z7901 Long term (current) use of anticoagulants: Secondary | ICD-10-CM | POA: Insufficient documentation

## 2023-10-13 DIAGNOSIS — M542 Cervicalgia: Secondary | ICD-10-CM | POA: Insufficient documentation

## 2023-10-13 DIAGNOSIS — M5030 Other cervical disc degeneration, unspecified cervical region: Secondary | ICD-10-CM | POA: Diagnosis not present

## 2023-10-13 DIAGNOSIS — Z955 Presence of coronary angioplasty implant and graft: Secondary | ICD-10-CM | POA: Diagnosis not present

## 2023-10-13 DIAGNOSIS — R519 Headache, unspecified: Secondary | ICD-10-CM | POA: Insufficient documentation

## 2023-10-13 DIAGNOSIS — S0990XA Unspecified injury of head, initial encounter: Secondary | ICD-10-CM | POA: Diagnosis not present

## 2023-10-13 DIAGNOSIS — S199XXA Unspecified injury of neck, initial encounter: Secondary | ICD-10-CM | POA: Diagnosis not present

## 2023-10-13 DIAGNOSIS — E1142 Type 2 diabetes mellitus with diabetic polyneuropathy: Secondary | ICD-10-CM | POA: Insufficient documentation

## 2023-10-13 DIAGNOSIS — K353 Acute appendicitis with localized peritonitis, without perforation or gangrene: Secondary | ICD-10-CM | POA: Diagnosis not present

## 2023-10-13 DIAGNOSIS — I4892 Unspecified atrial flutter: Secondary | ICD-10-CM | POA: Diagnosis not present

## 2023-10-13 DIAGNOSIS — R9082 White matter disease, unspecified: Secondary | ICD-10-CM | POA: Diagnosis not present

## 2023-10-13 DIAGNOSIS — M4319 Spondylolisthesis, multiple sites in spine: Secondary | ICD-10-CM | POA: Diagnosis not present

## 2023-10-13 MED ORDER — CYCLOBENZAPRINE HCL 5 MG PO TABS: 5.0000 mg | ORAL_TABLET | Freq: Two times a day (BID) | ORAL | 0 refills | Status: DC | PRN

## 2023-10-13 MED ORDER — HYDROCODONE-ACETAMINOPHEN 5-325 MG PO TABS
1.0000 | ORAL_TABLET | Freq: Once | ORAL | Status: AC
  Administered 2023-10-13: 1 via ORAL
  Filled 2023-10-13: qty 1

## 2023-10-13 NOTE — ED Notes (Signed)
 Patient transported to CT

## 2023-10-13 NOTE — Discharge Instructions (Addendum)
 Based on the events which brought you to the ER today, it is possible that you may have a concussion. A concussion occurs when there is a blow to the head or body, with enough force to shake the brain and disrupt how the brain functions. You may experience symptoms such as headaches, sensitivity to light/noise, dizziness, cognitive slowing, difficulty concentrating / remembering, trouble sleeping and drowsiness. These symptoms may last anywhere from hours/days to potentially weeks/months. While these symptoms are very frustrating and perhaps debilitating, it is important that you remember that they will improve over time. Everyone has a different rate of recovery; it is difficult to predict when your symptoms will resolve. In order to allow for your brain to heal after the injury, we recommend that you see your primary physician or a physician knowledgeable in concussion management. We also advise you to let your body and brain rest: avoid physical activities (sports, gym, and exercise) and reduce cognitive demands (reading, texting, TV watching, computer use, video games, etc). School attendance, after-school activities and work may need to be modified to avoid increasing symptoms. We recommend against driving until until all symptoms have resolved. Come back to the ER right away if you are having repeated episodes of vomiting, severe/worsening headache/dizziness or any other symptom that alarms you. We recommended that someone stay with you for the next 24 hours to monitor for these worrisome symptoms.  Be sure to staff at facility is turning you at regular intervals so you do not develop a pressure sore on your sacral/tailbone area.

## 2023-10-13 NOTE — ED Triage Notes (Signed)
 Pt here from [facility] with c/o unwitnessed fall no thinners no loc , pt is c/o right side neck pain staff stated that he may have hit his head

## 2023-10-13 NOTE — ED Notes (Signed)
[facility] called; 9th in line

## 2023-10-13 NOTE — ED Provider Notes (Signed)
 [place] EMERGENCY DEPARTMENT AT [facility] Provider Note  CSN: [id number] Arrival date & time: 10/13/23 0848  Chief Complaint(s) Fall  HPI Christian [name] is a 78 y.o. male with past medical history as below, significant for CKD, HTN, HLD, CABG times 07/2019, type II DM, ESRD MWF who presents to the ED with complaint of fall  Patient reports he rolled out of bed while he was sleeping.  Reports he often rolls in bed but unfortunately rolled off the bed this time.  Has pain to the right side of his neck and somewhat to the right side of his head.  Denies LOC, denies thinners.  Denies any other injuries.  No numbness or weakness to extremities, no back pain, no cp or dib, no abd pain.  Past Medical History 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   Diabetic polyneuropathy    Elevated liver enzymes 08/09/2019   Hiatal hernia    History of coronary artery stent placement    History of pulmonary embolism 08/09/2019   HTN (hypertension) 11/12/2009   Hyperlipidemia associated with type 2 diabetes mellitus 11/12/2009   Left leg weakness 11/23/2018   Mild dementia, 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 LIMA TO LAD, RIMA TO PDA, RADIAL ARTERY TO CIRC AND SEQUENTIALLY TO OM1.   Sleep difficulties 05/30/2020   Type II diabetes mellitus 10/10/2014   Patient Active Problem List   Diagnosis Date Noted   Pure hypercholesterolemia 09/20/2023   Paroxysmal atrial flutter (HCC) 09/17/2023   Appendicitis 08/31/2023   Mild dementia,  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 06/09/2023   Diabetic polyneuropathy    CHF (congestive heart failure)    Neck swelling 05/16/2021   Atypical pneumonia 12/12/2020   Pulmonary edema 10/03/2020   Sleep difficulties 05/30/2020   ABLA (acute blood loss anemia) 04/24/2020   Acute renal failure superimposed on stage 4 chronic kidney disease (HCC) 03/27/2020   Aortic atherosclerosis 03/27/2020   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   Pruritus 11/28/2019   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   Ischemic cardiomyopathy    History of coronary artery stent placement    History of pulmonary embolism 08/09/2019   Elevated liver enzymes 08/09/2019   NSTEMI (non-ST elevation myocardial infarction) 08/09/2019   Left leg weakness 11/23/2018   CAD (coronary artery disease) 07/30/2018   Type II diabetes mellitus 10/10/2014   Benign prostatic hyperplasia 09/27/2014   Hyperlipidemia associated with type 2 diabetes mellitus 11/12/2009   Multiple sclerosis 11/12/2009   HTN (hypertension) 11/12/2009   Shortness of breath 11/12/2009   Home Medication(s) Prior to Admission medications   Medication Sig Start Date End Date Taking? Authorizing Provider  acetaminophen  (TYLENOL ) 325 MG tablet Take 2 tablets (650 mg total) by mouth every 6 (six) hours as needed for mild pain (pain score 1-3), fever or headache. 10/01/23   Abbe Abate, MD  atorvastatin  (LIPITOR) 20 MG tablet Take 1 tablet (20 mg total) by mouth every evening. Patient taking differently: Take 20 mg by mouth daily after supper. 08/24/23  Knox Perl, MD  budesonide -formoterol  (SYMBICORT ) 160-4.5 MCG/ACT inhaler INHALE 2 PUFFS INTO THE LUNGS TWICE A DAY 12/09/22   Wilfredo Hanly, MD  calcitRIOL (ROCALTROL) 0.25 MCG capsule Take 0.25 mcg by mouth 3 (three) times a week. Take one capsule at dinner after on Mon-Wed-Fri. 07/21/23   [provider]  calcium  acetate (PHOSLO ) 667 MG capsule Take 1 capsule (667 mg total) by mouth 3 (three) times  daily with meals. 10/01/23   Abbe Abate, MD  clopidogrel  (PLAVIX ) 75 MG tablet Take 1 tablet (75 mg total) by mouth daily. 06/24/23   Knox Perl, MD  cromolyn  (OPTICROM ) 4 % ophthalmic solution Place 1 drop into both eyes every Wednesday. At bedtime 12/08/19   [provider]  Darbepoetin Alfa  (ARANESP ) 100 MCG/0.5ML SOSY injection Inject 0.5 mLs (100 mcg total) into the skin every Thursday at 6pm. 10/07/23   Abbe Abate, MD  diphenhydrAMINE  (BENADRYL ) 25 mg capsule Take 1 capsule (25 mg total) by mouth every 6 (six) hours as needed for itching. 10/01/23   Abbe Abate, MD  docusate sodium  (COLACE) 100 MG capsule Take 1 capsule (100 mg total) by mouth 2 (two) times daily. 08/31/23 08/30/24  Anda Bamberg, MD  ferrous sulfate  325 (65 FE) MG tablet Take 325 mg by mouth daily with breakfast.    [provider]  insulin  aspart (NOVOLOG ) 100 UNIT/ML injection Inject 0-20 Units into the skin 3 (three) times daily with meals. 10/01/23   Abbe Abate, MD  midodrine  (PROAMATINE ) 10 MG tablet Take 2 tablets (20 mg total) by mouth 3 (three) times daily with meals. 10/01/23   Abbe Abate, MD  multivitamin (RENA-VIT) TABS tablet Take 1 tablet by mouth at bedtime. 10/01/23   Abbe Abate, MD  senna-docusate (SENOKOT-S) 8.6-50 MG tablet Take 1 tablet by mouth 2 (two) times daily. 10/01/23   Abbe Abate, MD                                                                                                                                    Past Surgical History Past Surgical History:  Procedure Laterality Date   A/V FISTULAGRAM Left 08/12/2022   Procedure: A/V Fistulagram;  Surgeon: Kayla Part, MD;  Location: [facility] INVASIVE CV LAB;  Service: Cardiovascular;  Laterality: Left;   A/V FISTULAGRAM N/A 09/07/2023   Procedure: A/V Fistulagram;  Surgeon: Margherita Shell, MD;  Location: [facility] INVASIVE CV LAB;  Service: Cardiovascular;  Laterality: N/A;   APPLICATION OF WOUND  VAC N/A 08/24/2019   Procedure: APPLICATION OF WOUND VAC;  Surgeon: Rudine Cos, MD;  Location: [facility] OR;  Service: Thoracic;  Laterality: N/A;   APPLICATION OF WOUND VAC  08/29/2019   Procedure: Wound Vac change;  Surgeon: Rudine Cos, MD;  Location: [facility] OR;  Service: Open Heart Surgery;;   APPLICATION OF WOUND VAC N/A 09/04/2019   Procedure: Application Of  Wound Vac;  Surgeon: Norita Beauvais, MD;  Location: [facility] OR;  Service: Open Heart Surgery;  Laterality: N/A;   APPLICATION OF WOUND VAC N/A 09/06/2019   Procedure: APPLICATION OF ACELL, APPLICATION OF WOUND VAC USING PREVENA INCISIONAL  DRESSING;  Surgeon: Thornell Flirt, DO;  Location: [facility] OR;  Service: Plastics;  Laterality: N/A;   AV FISTULA PLACEMENT Left 09/18/2020   Procedure: ARTERIOVENOUS (AV) FISTULA CREATION LEFT VERSUS GRAFT;  Surgeon: Margherita Shell, MD;  Location: [facility] OR;  Service: Vascular;  Laterality: Left;   BASCILIC VEIN TRANSPOSITION Left 10/31/2020   Procedure: LEFT SECOND STAGE BASCILIC VEIN TRANSPOSITION;  Surgeon: Margherita Shell, MD;  Location: [facility] OR;  Service: Vascular;  Laterality: Left;   CARDIAC CATHETERIZATION     CORONARY ARTERY BYPASS GRAFT N/A 08/15/2019   Procedure: CORONARY ARTERY BYPASS GRAFTING (CABG), x 4 using bilateral IMAs and left radial artery .  LIMA TO LAD, RIMA TO PDA, RADIAL ARTERY TO CIRC AND SEQUENTIALLY TO OM1.;  Surgeon: Rudine Cos, MD;  Location: [facility] OR;  Service: Open Heart Surgery;  Laterality: N/A;   CORONARY STENT PLACEMENT  02/27/2014   distal rt/pd coronary       dr Berry [place]   CYSTO/ BLADDER BIOPSY'S/ CAUTHERIZATION  01-14-2004  DR Isla Mari   EXPLORATION POST OPERATIVE OPEN HEART N/A 08/16/2019   Procedure: Chest Closure S?P CABG WITH APPLICATION OF PREVENA  INCISIONAL WOUND VAC;  Surgeon: Rudine Cos, MD;  Location: [facility] OR;  Service: Open Heart Surgery;  Laterality: N/A;   EXPLORATION POST OPERATIVE OPEN HEART N/A 08/21/2019   Procedure: CHEST WASHOUT S/P OPEN CHEST;   Surgeon: Rudine Cos, MD;  Location: [facility] OR;  Service: Open Heart Surgery;  Laterality: N/A;  Open chest with Esmark dressing with Ioban sealant coverage.   EXPLORATION POST OPERATIVE OPEN HEART N/A 08/18/2019   Procedure: EXPLORATION POST OPERATIVE OPEN HEART (performed 04/23 on unit);  Surgeon: Rudine Cos, MD;  Location: [facility] OR;  Service: Open Heart Surgery;  Laterality: N/A;   EXPLORATION POST OPERATIVE OPEN HEART N/A 08/24/2019   Procedure: CHEST WASHOUT POST OPERATIVE OPEN HEART;  Surgeon: Rudine Cos, MD;  [facility]: [facility];  Service: Open Heart Surgery;  Laterality: N/A;   EXPLORATION POST OPERATIVE OPEN HEART N/A 08/29/2019   Procedure: CHEST WOUND WASHOUT POST OPERATIVE OPEN HEART;  Surgeon: Rudine Cos, MD;  [facility]: [facility] OR;  Service: Open Heart Surgery;  Laterality: N/A;   EXPLORATION POST OPERATIVE OPEN HEART N/A 09/04/2019   Procedure: MEDIASTINAL EXPLORATION WITH STERNAL WOUND IRRIGATION;  Surgeon: Norita Beauvais, MD;  Location: [facility] OR;  Service: Open Heart Surgery;  Laterality: N/A;   EXPLORATION POST OPERATIVE OPEN HEART N/A 09/14/2019   Procedure: EVACUATION OF HEMATOMA;  Surgeon: Norita Beauvais, MD;  Location: [facility] OR;  Service: Open Heart Surgery;  Laterality: N/A;   INSERTION OF DIALYSIS CATHETER N/A 09/16/2023   Procedure: INSERTION OF DIALYSIS CATHETER;  Surgeon: Young Hensen, MD;  Location: [facility] OR;  Service: Vascular;  Laterality: N/A;  TUNNELED DIALYSIS CATHETER   IR FLUORO GUIDE CV LINE LEFT  09/19/2019   IR FLUORO GUIDE CV LINE RIGHT  09/08/2023   IR GASTROSTOMY TUBE MOD SED  10/04/2019   IR GASTROSTOMY TUBE REMOVAL  11/29/2019   IR PATIENT EVAL TECH 0-60 MINS  09/29/2019   IR US  GUIDE VASC ACCESS LEFT  09/19/2019   IR US  GUIDE VASC ACCESS RIGHT  09/08/2023   LAPAROSCOPIC APPENDECTOMY N/A 08/31/2023   Procedure:  APPENDECTOMY, LAPAROSCOPIC;  Surgeon: Anda Bamberg, MD;  Location: [facility] OR;  Service: General;  Laterality: N/A;   LAPAROSCOPIC LYSIS OF  ADHESIONS N/A 09/06/2019   Procedure: LAPAROSCOPIC OMENTAL HARVEST;  Surgeon: Dorrie Gaudier Alphonso Aschoff, MD;  Location: [facility] OR;  Service: General;  Laterality: N/A;   LEFT HEART CATH AND CORONARY ANGIOGRAPHY N/A 08/10/2019   Procedure: LEFT HEART CATH AND CORONARY ANGIOGRAPHY;  Surgeon: Cody Das, MD;  Location: [facility] INVASIVE CV LAB;  Service: Cardiovascular;  Laterality: N/A;   LEFT HEART CATHETERIZATION WITH CORONARY ANGIOGRAM N/A 02/27/2014   Procedure: LEFT HEART CATHETERIZATION WITH CORONARY ANGIOGRAM;  Surgeon: Jessica Morn, MD;  Location: [facility] CATH LAB;  Service: Cardiovascular;  Laterality: N/A;   MEDIASTINAL EXPLORATION N/A 09/06/2019   Procedure: MEDIASTINAL EXPLORATION;  Surgeon: Norita Beauvais, MD;  Location: [facility] OR;  Service: Thoracic;  Laterality: N/A;   PECTORALIS FLAP  09/06/2019   Procedure: Pectoralis ADVANCEMENT Flap;  Surgeon: Thornell Flirt, DO;  Location: [facility] OR;  Service: Plastics;;   PERCUTANEOUS CORONARY STENT INTERVENTION (PCI-S)  02/27/2014   Procedure: PERCUTANEOUS CORONARY STENT INTERVENTION (PCI-S);  Surgeon: Jessica Morn, MD;  Location: [facility] CATH LAB;  Service: Cardiovascular;;  rt PDA  3.0/28mm Promus stent   PERIPHERAL VASCULAR INTERVENTION Left 08/12/2022   Procedure: PERIPHERAL VASCULAR INTERVENTION;  Surgeon: Kayla Part, MD;  Location: [facility] INVASIVE CV LAB;  Service: Cardiovascular;  Laterality: Left;   RADIAL ARTERY HARVEST Left 08/15/2019   Procedure: Radial Artery Harvest;  Surgeon: Rudine Cos, MD;  Location: [facility] OR;  Service: Open Heart Surgery;  Laterality: Left;   REMOVAL OF A HERO DEVICE Right 09/16/2023   Procedure: REMOVAL OF TEMPORARY TUNNELED HEMODIALYSIS CATHETER;  Surgeon: Young Hensen, MD;  Location: [facility] OR;  Service: Vascular;  Laterality: Right;   RIB PLATING N/A 09/06/2019   Procedure: STERNAL PLATING;  Surgeon: Norita Beauvais, MD;  Location: [facility] OR;  Service: Thoracic;  Laterality: N/A;   TEE WITHOUT CARDIOVERSION  N/A 08/15/2019   Procedure: TRANSESOPHAGEAL ECHOCARDIOGRAM (TEE);  Surgeon: Rudine Cos, MD;  Location: [facility] OR;  Service: Open Heart Surgery;  Laterality: N/A;   TRACHEOSTOMY TUBE PLACEMENT  08/29/2019   Procedure: Tracheostomy;  Surgeon: Rudine Cos, MD;  Location: [facility] OR;  Service: Open Heart Surgery; Decannulated 6/10/20211   TRANSURETHRAL RESECTION OF PROSTATE  04/04/2012   Procedure: TRANSURETHRAL RESECTION OF THE PROSTATE WITH GYRUS INSTRUMENTS;  Surgeon: Edmund Gouge, MD;  Location: [facility];  Service: Urology;  Laterality: N/A;   TRANSURETHRAL RESECTION OF PROSTATE N/A 09/27/2014   Procedure: TRANSURETHRAL RESECTION OF THE PROSTATE ;  Surgeon: Annamarie Kid, MD;  Location: [facility] ORS;  Service: Urology;  Laterality: N/A;   TUNNELLED CATHETER EXCHANGE N/A 09/07/2023   Procedure: TUNNELLED CATHETER EXCHANGE;  Surgeon: Margherita Shell, MD;  Location: [facility] INVASIVE CV LAB;  Service: Cardiovascular;  Laterality: N/A;   ULTRASOUND GUIDANCE FOR VASCULAR ACCESS Right 09/16/2023   Procedure: ULTRASOUND GUIDANCE, FOR VASCULAR ACCESS;  Surgeon: Young Hensen, MD;  Location: [facility] OR;  Service: Vascular;  Laterality: Right;   UPPER GASTROINTESTINAL ENDOSCOPY     Family History Family History  Problem Relation Age of Onset   Diabetes Mother    Hypertension Mother    Diabetes Father    Diabetes Brother    Hypertension Brother    Bone cancer Brother    Diabetes Brother     Social History Social History   Tobacco Use   Smoking status: Former  Current packs/day: 0.00    Average packs/day: 0.5 packs/day for 20.0 years (10.0 ttl pk-yrs)    Types: Cigarettes    Start date: 44    Quit date: 55    Years since quitting: 50.4   Smokeless tobacco: Never  Vaping Use   Vaping status: Never Used  Substance Use Topics   Alcohol use: Not Currently    Comment: very rarely - every now and then 1 drink/1 year if that   Drug use: No   Allergies Patient has no  known allergies.  Review of Systems A thorough review of systems was obtained and all systems are negative except as noted in the HPI and PMH.   Physical Exam Vital Signs  I have reviewed the triage vital signs There were no vitals taken for this visit. Physical Exam Vitals and nursing note reviewed.  Constitutional:      General: He is not in acute distress.    Appearance: Normal appearance. He is well-developed.  HENT:     Head: Normocephalic and atraumatic.     Right Ear: External ear normal.     Left Ear: External ear normal.     Mouth/Throat:     Mouth: Mucous membranes are moist.   Eyes:     General: No scleral icterus.    Extraocular Movements: Extraocular movements intact.     Pupils: Pupils are equal, round, and reactive to light.   Neck:     Comments: C-collar Cardiovascular:     Rate and Rhythm: Normal rate and regular rhythm.     Pulses: Normal pulses.     Heart sounds: Normal heart sounds.  Pulmonary:     Effort: Pulmonary effort is normal. No respiratory distress.     Breath sounds: Normal breath sounds.  Chest:   Abdominal:     General: Abdomen is flat.     Palpations: Abdomen is soft.     Tenderness: There is no abdominal tenderness.   Musculoskeletal:     Cervical back: No rigidity.     Right lower leg: No edema.     Left lower leg: No edema.     Comments: Pelvis stable AP pressure  No pain with logroll lower extremities  No midline spinous process TTP with palpation   Skin:    General: Skin is warm and dry.     Capillary Refill: Capillary refill takes less than 2 seconds.   Neurological:     Mental Status: He is alert and oriented to person, place, and time.     GCS: GCS eye subscore is 4. GCS verbal subscore is 5. GCS motor subscore is 6.     Cranial Nerves: No dysarthria.     Sensory: Sensation is intact.     Motor: No weakness.     Comments: Strength 5/5 to BLUE/BLLE, equal and symmetric     Psychiatric:        Mood and Affect:  Mood normal.        Behavior: Behavior normal.     ED Results and Treatments Labs (all labs ordered are listed, but only abnormal results are displayed) Labs Reviewed - No data to display  Radiology CT Cervical Spine Wo Contrast Result Date: 10/13/2023 CLINICAL DATA:  Neck trauma (Age >= 65y) EXAM: CT CERVICAL SPINE WITHOUT CONTRAST TECHNIQUE: Multidetector CT imaging of the cervical spine was performed without intravenous contrast. Multiplanar CT image reconstructions were also generated. RADIATION DOSE REDUCTION: This exam was performed according to the departmental dose-optimization program which includes automated exposure control, adjustment of the mA and/or kV according to patient size and/or use of iterative reconstruction technique. COMPARISON:  CT of the cervical spine dated April 06, 2023. FINDINGS: Alignment: Chronic degenerative anterolisthesis of C2 on C3, proximally 4 mm, as before. Slight anterolisthesis at C7-T1 with fusion of the facet joints. There is also fusion of the facet joints at C3-4 bilaterally and there is fusion of the right facet joints at C5-6. There is a mild levocurvature of the cervical spine. Skull base and vertebrae: The vertebral bodies are intact. There is multi level chronic degenerative disc disease and facet arthrosis. No osseous lesions are present. Soft tissues and spinal canal: No soft tissue injury is evident. Disc levels: There is diffuse degenerative disc disease and facet arthrosis present throughout the cervical spine with multilevel spinal canal and neural foraminal stenosis, including moderate to severe left neural foraminal stenosis at C4-5 and moderate central spinal canal stenosis at C3-4 and C4-5. Upper chest: The lung apices are clear. Other: None. IMPRESSION: 1. 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 No significant change from the previous study. Electronically Signed   By: Maribeth Shivers M.D.   On: 10/13/2023 11:03   CT Head Wo Contrast Result Date: 10/13/2023 CLINICAL DATA:  Head trauma, minor (Age >= 65y) EXAM: CT HEAD WITHOUT CONTRAST TECHNIQUE: Contiguous axial images were obtained from the base of the skull through the vertex without intravenous contrast. RADIATION DOSE REDUCTION: This exam was performed according to the departmental dose-optimization program which includes automated exposure control, adjustment of the mA and/or kV according to patient size and/or use of iterative reconstruction technique. COMPARISON:  CT of the head dated April 06, 2023. FINDINGS: Brain: There is moderate generalized cerebral and cerebellar volume loss present. There is mild to moderate periventricular white matter disease. There is no evidence of acute intracranial injury. No evidence of hemorrhage, mass, acute cortical infarct or hydrocephalus. Vascular: Moderate atheromatous calcifications within the carotid siphons. Skull: Intact and unremarkable. Sinuses/Orbits: No apparent acute process. Other: None. IMPRESSION: 1. Age-related atrophy and mild-to-moderate periventricular white matter disease. Electronically Signed   By: Maribeth Shivers M.D.   On: 10/13/2023 10:56    Pertinent labs & imaging results that were available during my care of the patient were reviewed by me and considered in my medical decision making (see MDM for details).  Medications Ordered in ED Medications  HYDROcodone -acetaminophen  (NORCO/VICODIN) 5-325 MG per tablet 1 tablet (1 tablet Oral Given 10/13/23 0917)  Procedures Procedures  (including critical care time)  Medical Decision Making / ED Course    Medical Decision Making:    Christian [name] is a 78 y.o. male with past medical history as below, significant for CKD, HTN, HLD, CABG times 07/2019, ESRD MWF, type II DM who presents to the ED with complaint of fall. The complaint involves an extensive differential diagnosis and also carries with it a high risk of complications and morbidity.  Serious etiology was considered. Ddx includes but is not limited to: Differential diagnoses for head trauma includes subdural hematoma, epidural hematoma, acute concussion, traumatic subarachnoid hemorrhage, cerebral contusions, etc.   Complete initial physical exam performed, notably the patient was in no distress, neuro nonfocal.    Reviewed and confirmed nursing documentation for past medical history, family history, social history.  Vital signs reviewed.     Brief summary:  78 year old male history as above here after falling from bed Possible head injury, possible neck injury.  Having pain to right of his neck, c-collar in place prior to arrival   Clinical Course as of 10/13/23 1302  Wed Oct 13, 2023  1205 Imaging stable  Feeling better [SG]    Clinical Course User Index [SG] Teddi Favors, DO     Imaging stable Feeling better Concussion precautions provided F/u pcp  Nursing spoke with hd center, will try and work him in tomorrow as he missed HD today  Patient in no distress and overall condition improved here in the ED. Detailed discussions were had with the patient/guardian regarding current findings, and need for close f/u with PCP or on call doctor. The patient/guardian has been instructed to return immediately if the symptoms worsen in any way for re-evaluation. Patient/guardian verbalized understanding and is in agreement with current care plan. All questions answered prior to discharge.              Additional history obtained: -Additional history obtained from na -External records from outside source obtained and reviewed including: Chart review  including previous notes, labs, imaging, consultation notes including  Home medications   Lab Tests: na  EKG   EKG Interpretation Date/Time:    Ventricular Rate:    PR Interval:    QRS Duration:    QT Interval:    QTC Calculation:   R Axis:      Text Interpretation:           Imaging Studies ordered: I ordered imaging studies including CTH CT c/s I independently visualized the following imaging with scope of interpretation limited to determining acute life threatening conditions related to emergency care; findings noted above I agree with the radiologist interpretation If any imaging was obtained with contrast I closely monitored patient for any possible adverse reaction a/w contrast administration in the emergency department   Medicines ordered and prescription drug management: Meds ordered this encounter  Medications   HYDROcodone -acetaminophen  (NORCO/VICODIN) 5-325 MG per tablet 1 tablet    Refill:  0    -I have reviewed the patients home medicines and have made adjustments as needed   Consultations Obtained: na   Cardiac Monitoring: Continuous pulse oximetry interpreted by myself, 99% on ra.    Social Determinants of Health:  Diagnosis or treatment significantly limited by social determinants of health: former smoker   Reevaluation: After the interventions noted above, I reevaluated the patient and found that they have improved  Co morbidities that complicate the patient evaluation  Past Medical History:  Diagnosis Date   Anemia  in chronic kidney disease 01/19/2020   Aortic atherosclerosis 03/27/2020   Atypical pneumonia 12/12/2020   Benign prostatic hyperplasia 09/27/2014   CAD (coronary artery disease) 07/30/2018   CHF (congestive heart failure)    CKD (chronic kidney disease) stage 4, GFR 15-29 ml/min 03/27/2020   Diabetic polyneuropathy    Elevated liver enzymes 08/09/2019   Hiatal hernia    History of coronary artery stent placement     History of pulmonary embolism 08/09/2019   HTN (hypertension) 11/12/2009   Hyperlipidemia associated with type 2 diabetes mellitus 11/12/2009   Left leg weakness 11/23/2018   Mild dementia, 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 LIMA TO LAD, RIMA TO PDA, RADIAL ARTERY TO CIRC AND SEQUENTIALLY TO OM1.   Sleep difficulties 05/30/2020   Type II diabetes mellitus 10/10/2014      Dispostion: Disposition decision including need for hospitalization was considered, and patient discharged from emergency department.    Final Clinical Impression(s) / ED Diagnoses Final diagnoses:  Fall from bed, initial encounter        Teddi Favors, DO 10/13/23 1302

## 2023-10-14 ENCOUNTER — Encounter: Payer: Self-pay | Admitting: Physician Assistant

## 2023-10-14 ENCOUNTER — Other Ambulatory Visit: Payer: Self-pay | Admitting: *Deleted

## 2023-10-14 DIAGNOSIS — Z992 Dependence on renal dialysis: Secondary | ICD-10-CM

## 2023-10-14 DIAGNOSIS — N186 End stage renal disease: Secondary | ICD-10-CM

## 2023-10-15 ENCOUNTER — Other Ambulatory Visit (HOSPITAL_COMMUNITY): Payer: Self-pay

## 2023-10-15 ENCOUNTER — Other Ambulatory Visit: Payer: Self-pay

## 2023-10-15 DIAGNOSIS — G8929 Other chronic pain: Secondary | ICD-10-CM | POA: Diagnosis not present

## 2023-10-15 DIAGNOSIS — I4891 Unspecified atrial fibrillation: Secondary | ICD-10-CM | POA: Diagnosis not present

## 2023-10-15 DIAGNOSIS — E119 Type 2 diabetes mellitus without complications: Secondary | ICD-10-CM | POA: Diagnosis not present

## 2023-10-15 DIAGNOSIS — N186 End stage renal disease: Secondary | ICD-10-CM | POA: Diagnosis not present

## 2023-10-15 DIAGNOSIS — K3580 Unspecified acute appendicitis: Secondary | ICD-10-CM | POA: Diagnosis not present

## 2023-10-20 DIAGNOSIS — K37 Unspecified appendicitis: Secondary | ICD-10-CM | POA: Diagnosis not present

## 2023-10-20 DIAGNOSIS — M6281 Muscle weakness (generalized): Secondary | ICD-10-CM | POA: Diagnosis not present

## 2023-10-20 DIAGNOSIS — I4892 Unspecified atrial flutter: Secondary | ICD-10-CM | POA: Diagnosis not present

## 2023-10-20 DIAGNOSIS — R2689 Other abnormalities of gait and mobility: Secondary | ICD-10-CM | POA: Diagnosis not present

## 2023-10-20 DIAGNOSIS — Z992 Dependence on renal dialysis: Secondary | ICD-10-CM | POA: Diagnosis not present

## 2023-10-20 DIAGNOSIS — K353 Acute appendicitis with localized peritonitis, without perforation or gangrene: Secondary | ICD-10-CM | POA: Diagnosis not present

## 2023-10-26 DIAGNOSIS — G35 Multiple sclerosis: Secondary | ICD-10-CM | POA: Diagnosis not present

## 2023-10-26 DIAGNOSIS — I700 Atherosclerosis of aorta: Secondary | ICD-10-CM | POA: Diagnosis not present

## 2023-10-26 DIAGNOSIS — K37 Unspecified appendicitis: Secondary | ICD-10-CM | POA: Diagnosis not present

## 2023-10-26 DIAGNOSIS — F03A0 Unspecified dementia, mild, without behavioral disturbance, psychotic disturbance, mood disturbance, and anxiety: Secondary | ICD-10-CM | POA: Diagnosis not present

## 2023-10-26 DIAGNOSIS — K353 Acute appendicitis with localized peritonitis, without perforation or gangrene: Secondary | ICD-10-CM | POA: Diagnosis not present

## 2023-10-26 DIAGNOSIS — E785 Hyperlipidemia, unspecified: Secondary | ICD-10-CM | POA: Diagnosis not present

## 2023-10-26 DIAGNOSIS — M6281 Muscle weakness (generalized): Secondary | ICD-10-CM | POA: Diagnosis not present

## 2023-10-26 DIAGNOSIS — Z86711 Personal history of pulmonary embolism: Secondary | ICD-10-CM | POA: Diagnosis not present

## 2023-10-26 DIAGNOSIS — N400 Enlarged prostate without lower urinary tract symptoms: Secondary | ICD-10-CM | POA: Diagnosis not present

## 2023-10-26 DIAGNOSIS — R2689 Other abnormalities of gait and mobility: Secondary | ICD-10-CM | POA: Diagnosis not present

## 2023-10-26 DIAGNOSIS — E119 Type 2 diabetes mellitus without complications: Secondary | ICD-10-CM | POA: Diagnosis not present

## 2023-10-26 DIAGNOSIS — N186 End stage renal disease: Secondary | ICD-10-CM | POA: Diagnosis not present

## 2023-10-26 DIAGNOSIS — Z8673 Personal history of transient ischemic attack (TIA), and cerebral infarction without residual deficits: Secondary | ICD-10-CM | POA: Diagnosis not present

## 2023-10-26 DIAGNOSIS — Z992 Dependence on renal dialysis: Secondary | ICD-10-CM | POA: Diagnosis not present

## 2023-10-26 DIAGNOSIS — R1313 Dysphagia, pharyngeal phase: Secondary | ICD-10-CM | POA: Diagnosis not present

## 2023-10-26 DIAGNOSIS — Z951 Presence of aortocoronary bypass graft: Secondary | ICD-10-CM | POA: Diagnosis not present

## 2023-10-26 DIAGNOSIS — I2510 Atherosclerotic heart disease of native coronary artery without angina pectoris: Secondary | ICD-10-CM | POA: Diagnosis not present

## 2023-10-26 DIAGNOSIS — I10 Essential (primary) hypertension: Secondary | ICD-10-CM | POA: Diagnosis not present

## 2023-10-26 DIAGNOSIS — E7800 Pure hypercholesterolemia, unspecified: Secondary | ICD-10-CM | POA: Diagnosis not present

## 2023-10-26 DIAGNOSIS — I4892 Unspecified atrial flutter: Secondary | ICD-10-CM | POA: Diagnosis not present

## 2023-10-26 DIAGNOSIS — L299 Pruritus, unspecified: Secondary | ICD-10-CM | POA: Diagnosis not present

## 2023-10-26 DIAGNOSIS — I255 Ischemic cardiomyopathy: Secondary | ICD-10-CM | POA: Diagnosis not present

## 2023-10-26 DIAGNOSIS — Z48815 Encounter for surgical aftercare following surgery on the digestive system: Secondary | ICD-10-CM | POA: Diagnosis not present

## 2023-10-26 DIAGNOSIS — D631 Anemia in chronic kidney disease: Secondary | ICD-10-CM | POA: Diagnosis not present

## 2023-10-27 DIAGNOSIS — Z992 Dependence on renal dialysis: Secondary | ICD-10-CM | POA: Diagnosis not present

## 2023-10-27 DIAGNOSIS — K353 Acute appendicitis with localized peritonitis, without perforation or gangrene: Secondary | ICD-10-CM | POA: Diagnosis not present

## 2023-10-27 DIAGNOSIS — R2689 Other abnormalities of gait and mobility: Secondary | ICD-10-CM | POA: Diagnosis not present

## 2023-10-27 DIAGNOSIS — M6281 Muscle weakness (generalized): Secondary | ICD-10-CM | POA: Diagnosis not present

## 2023-10-27 DIAGNOSIS — I4892 Unspecified atrial flutter: Secondary | ICD-10-CM | POA: Diagnosis not present

## 2023-10-27 DIAGNOSIS — K37 Unspecified appendicitis: Secondary | ICD-10-CM | POA: Diagnosis not present

## 2023-10-27 NOTE — Progress Notes (Unsigned)
 Office Note    CC: Need for new access. Requesting Provider:  Rollene Almarie LABOR, *  HPI: Christian [name] is a 78 y.o. (08/23/1945) male presenting at the request of .Rollene Almarie LABOR, MD in need of new access  Patient is well-known to our service line having previously undergone left arm brachiobasilic fistula with subsequent superficialization.  This had difficulty maturing, and underwent multiple endovascular inventions to improve flow including balloon venoplasty and stenting.  I last saw Christian [name] in the hospital after he was admitted for appendicitis resulting in laparoscopic appendectomy for perforated appendix.  Inpatient dialysis had difficulty cannulating his fistula, therefore a temporary line was placed, followed by tunneled dialysis catheter by my partner Dr. Gretta.  He presents today to evaluate the left arm brachiobasilic fistula.  The pt is *** on a statin for cholesterol management.  The pt is *** on a daily aspirin .   Other AC:  *** The pt is *** on medication for hypertension.   The pt is *** diabetic.  Tobacco hx:  ***  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   Diabetic polyneuropathy    Elevated liver enzymes 08/09/2019   Hiatal hernia    History of coronary artery stent placement    History of pulmonary embolism 08/09/2019   HTN (hypertension) 11/12/2009   Hyperlipidemia associated with type 2 diabetes mellitus 11/12/2009   Left leg weakness 11/23/2018   Mild dementia, 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 LIMA TO LAD, RIMA TO PDA, RADIAL ARTERY TO CIRC AND SEQUENTIALLY TO OM1.   Sleep difficulties 05/30/2020   Type II diabetes mellitus 10/10/2014    Past Surgical History:  Procedure Laterality Date   A/V FISTULAGRAM Left 08/12/2022   Procedure: A/V Fistulagram;  Surgeon: Lanis Fonda BRAVO, MD;  [facility]: [facility] INVASIVE CV LAB;  Service: Cardiovascular;  Laterality: Left;   A/V FISTULAGRAM N/A 09/07/2023   Procedure: A/V Fistulagram;  Surgeon: Serene Gaile ORN, MD;  Location: [facility] INVASIVE CV LAB;  Service: Cardiovascular;  Laterality: N/A;   APPLICATION OF WOUND VAC N/A 08/24/2019   Procedure: APPLICATION OF WOUND VAC;  Surgeon: German Bartlett PEDLAR, MD;  Location: [facility] OR;  Service: Thoracic;  Laterality: N/A;   APPLICATION OF WOUND VAC  08/29/2019   Procedure: Wound Vac change;  Surgeon: German Bartlett PEDLAR, MD;  Location: [facility] OR;  Service: Open Heart Surgery;;   APPLICATION OF WOUND VAC N/A 09/04/2019   Procedure: Application Of Wound Vac;  Surgeon: Army Dallas NOVAK, MD;  Location: [facility] OR;  Service: Open Heart Surgery;  Laterality: N/A;   APPLICATION OF WOUND VAC N/A 09/06/2019   Procedure: APPLICATION OF ACELL, APPLICATION OF WOUND VAC USING PREVENA INCISIONAL  DRESSING;  Surgeon: Lowery Estefana GORMAN, DO;  Location: [facility] OR;  Service: Plastics;  Laterality: N/A;   AV FISTULA PLACEMENT Left 09/18/2020   Procedure: ARTERIOVENOUS (AV) FISTULA CREATION LEFT VERSUS GRAFT;  Surgeon: Serene Gaile ORN, MD;  Location: [facility] OR;  Service: Vascular;  Laterality: Left;   BASCILIC VEIN TRANSPOSITION Left 10/31/2020   Procedure:  LEFT SECOND STAGE BASCILIC VEIN TRANSPOSITION;  Surgeon: Serene Gaile ORN, MD;  Location: [facility];  Service: Vascular;  Laterality: Left;   CARDIAC CATHETERIZATION     CORONARY ARTERY BYPASS GRAFT N/A 08/15/2019   Procedure: CORONARY ARTERY BYPASS GRAFTING (CABG), x 4 using bilateral IMAs and left radial artery .  LIMA TO LAD, RIMA TO PDA, RADIAL ARTERY TO CIRC AND SEQUENTIALLY TO OM1.;  Surgeon:  German Bartlett PEDLAR, MD;  Location: [facility] OR;  Service: Open Heart Surgery;  Laterality: N/A;   CORONARY STENT PLACEMENT  02/27/2014   distal rt/pd coronary       dr ladona   CYSTO/ BLADDER BIOPSY'S/ CAUTHERIZATION  01-14-2004  DR CHALES   EXPLORATION POST OPERATIVE OPEN HEART N/A 08/16/2019   Procedure: Chest Closure S?P CABG WITH APPLICATION OF PREVENA  INCISIONAL WOUND VAC;  Surgeon: German Bartlett PEDLAR, MD;  [facility]: [facility];  Service: Open Heart Surgery;  Laterality: N/A;   EXPLORATION POST OPERATIVE OPEN HEART N/A 08/21/2019   Procedure: CHEST WASHOUT S/P OPEN CHEST;  Surgeon: German Bartlett PEDLAR, MD;  [facility]: [facility];  Service: Open Heart Surgery;  Laterality: N/A;  Open chest with Esmark dressing with Ioban sealant coverage.   EXPLORATION POST OPERATIVE OPEN HEART N/A 08/18/2019   Procedure: EXPLORATION POST OPERATIVE OPEN HEART (performed 04/23 on unit);  Surgeon: German Bartlett PEDLAR, MD;  Location: [facility] OR;  Service: Open Heart Surgery;  Laterality: N/A;   EXPLORATION POST OPERATIVE OPEN HEART N/A 08/24/2019   Procedure: CHEST WASHOUT POST OPERATIVE OPEN HEART;  Surgeon: German Bartlett PEDLAR, MD;  [facility];  Service: Open Heart Surgery;  Laterality: N/A;   EXPLORATION POST OPERATIVE OPEN HEART N/A 08/29/2019   Procedure: CHEST WOUND WASHOUT POST OPERATIVE OPEN HEART;  Surgeon: German Bartlett PEDLAR, MD;  [facility];  Service: Open Heart Surgery;  Laterality: N/A;   EXPLORATION POST OPERATIVE OPEN HEART N/A 09/04/2019   Procedure: MEDIASTINAL EXPLORATION WITH STERNAL WOUND IRRIGATION;  Surgeon: Army Dallas NOVAK, MD;  [facility] OR;  Service: Open Heart Surgery;  Laterality: N/A;   EXPLORATION POST OPERATIVE OPEN HEART N/A 09/14/2019   Procedure: EVACUATION OF HEMATOMA;  Surgeon: Army Dallas NOVAK, MD;  Location: [facility] OR;  Service: Open Heart Surgery;  Laterality: N/A;   INSERTION OF DIALYSIS CATHETER N/A 09/16/2023   Procedure: INSERTION OF DIALYSIS CATHETER;  Surgeon: Gretta Lonni PARAS, MD;   [facility]: [facility];  Service: Vascular;  Laterality: N/A;  TUNNELED DIALYSIS CATHETER   IR FLUORO GUIDE CV LINE LEFT  09/19/2019   IR FLUORO GUIDE CV LINE RIGHT  09/08/2023   IR GASTROSTOMY TUBE MOD SED  10/04/2019   IR GASTROSTOMY TUBE REMOVAL  11/29/2019   IR PATIENT EVAL TECH 0-60 MINS  09/29/2019   IR US  GUIDE VASC ACCESS LEFT  09/19/2019   IR US  GUIDE VASC ACCESS RIGHT  09/08/2023   LAPAROSCOPIC APPENDECTOMY N/A 08/31/2023   Procedure: APPENDECTOMY, LAPAROSCOPIC;  Surgeon: Paola Dreama SAILOR, MD;  [facility]: [facility];  Service: General;  Laterality: N/A;   LAPAROSCOPIC LYSIS OF ADHESIONS N/A 09/06/2019   Procedure: LAPAROSCOPIC OMENTAL HARVEST;  Surgeon: Stevie Herlene Righter, MD;  [facility]: [facility];  Service: General;  Laterality: N/A;   LEFT HEART CATH AND CORONARY ANGIOGRAPHY N/A 08/10/2019   Procedure: LEFT HEART CATH AND CORONARY ANGIOGRAPHY;  Surgeon: Elmira Newman PARAS, MD;  Location: [facility] INVASIVE CV LAB;  Service: Cardiovascular;  Laterality: N/A;   LEFT HEART CATHETERIZATION WITH CORONARY ANGIOGRAM N/A 02/27/2014   Procedure: LEFT HEART CATHETERIZATION  WITH CORONARY ANGIOGRAM;  Surgeon: Erick JONELLE Bergamo, MD;  Location: [facility] CATH LAB;  Service: Cardiovascular;  Laterality: N/A;   MEDIASTINAL EXPLORATION N/A 09/06/2019   Procedure: MEDIASTINAL EXPLORATION;  Surgeon: Army Dallas NOVAK, MD;  Location: [facility] OR;  Service: Thoracic;  Laterality: N/A;   PECTORALIS FLAP  09/06/2019   Procedure: Pectoralis ADVANCEMENT Flap;  Surgeon: Lowery Estefana RAMAN, DO;  Location: [facility] OR;  Service: Plastics;;   PERCUTANEOUS CORONARY STENT INTERVENTION (PCI-S)  02/27/2014   Procedure: PERCUTANEOUS CORONARY STENT INTERVENTION (PCI-S);  Surgeon: Erick JONELLE Bergamo, MD;  Location: [facility] CATH LAB;  Service: Cardiovascular;;  rt PDA  3.0/28mm Promus stent   PERIPHERAL VASCULAR INTERVENTION Left 08/12/2022   Procedure: PERIPHERAL VASCULAR INTERVENTION;  Surgeon: Lanis Fonda BRAVO, MD;  Location: [facility] INVASIVE CV LAB;  Service: Cardiovascular;   Laterality: Left;   RADIAL ARTERY HARVEST Left 08/15/2019   Procedure: Radial Artery Harvest;  Surgeon: German Bartlett PEDLAR, MD;  Location: [facility] OR;  Service: Open Heart Surgery;  Laterality: Left;   REMOVAL OF A HERO DEVICE Right 09/16/2023   Procedure: REMOVAL OF TEMPORARY TUNNELED HEMODIALYSIS CATHETER;  Surgeon: Gretta Lonni PARAS, MD;  Location: [facility] OR;  Service: Vascular;  Laterality: Right;   RIB PLATING N/A 09/06/2019   Procedure: STERNAL PLATING;  Surgeon: Army Dallas NOVAK, MD;  Location: [facility] OR;  Service: Thoracic;  Laterality: N/A;   TEE WITHOUT CARDIOVERSION N/A 08/15/2019   Procedure: TRANSESOPHAGEAL ECHOCARDIOGRAM (TEE);  Surgeon: German Bartlett PEDLAR, MD;  Location: [facility] OR;  Service: Open Heart Surgery;  Laterality: N/A;   TRACHEOSTOMY TUBE PLACEMENT  08/29/2019   Procedure: Tracheostomy;  Surgeon: German Bartlett PEDLAR, MD;  Location: [facility] OR;  Service: Open Heart Surgery; Decannulated 6/10/20211   TRANSURETHRAL RESECTION OF PROSTATE  04/04/2012   Procedure: TRANSURETHRAL RESECTION OF THE PROSTATE WITH GYRUS INSTRUMENTS;  Surgeon: Arlena LILLETTE Gal, MD;  Location: [facility];  Service: Urology;  Laterality: N/A;   TRANSURETHRAL RESECTION OF PROSTATE N/A 09/27/2014   Procedure: TRANSURETHRAL RESECTION OF THE PROSTATE ;  Surgeon: Arlena Gal, MD;  Location: [facility] ORS;  Service: Urology;  Laterality: N/A;   TUNNELLED CATHETER EXCHANGE N/A 09/07/2023   Procedure: TUNNELLED CATHETER EXCHANGE;  Surgeon: Serene Gaile ORN, MD;  Location: [facility] INVASIVE CV LAB;  Service: Cardiovascular;  Laterality: N/A;   ULTRASOUND GUIDANCE FOR VASCULAR ACCESS Right 09/16/2023   Procedure: ULTRASOUND GUIDANCE, FOR VASCULAR ACCESS;  Surgeon: Gretta Lonni PARAS, MD;  Location: [facility] OR;  Service: Vascular;  Laterality: Right;   UPPER GASTROINTESTINAL ENDOSCOPY      Social History   Socioeconomic History   Marital status: Married    Spouse name: Not on file   Number of children: 2   Years of education:  14   Highest education level: Associate degree: academic program  Occupational History   Occupation: Retired  Tobacco Use   Smoking status: Former    Current packs/day: 0.00    Average packs/day: 0.5 packs/day for 20.0 years (10.0 ttl pk-yrs)    Types: Cigarettes    Start date: 45    Quit date: 1975    Years since quitting: 50.5   Smokeless tobacco: Never  Vaping Use   Vaping status: Never Used  Substance and Sexual Activity   Alcohol use: Not Currently    Comment: very rarely - every now and then 1 drink/1 year if that   Drug use: No   Sexual activity: Not Currently  Other Topics Concern   Not on file  Social History Narrative  Right handed   Dirnks caffeine prn   Married   Lives with wife   Social Drivers of Health   Financial Resource Strain: Low Risk  (07/23/2021)   Overall Financial Resource Strain (CARDIA)    Difficulty of Paying Living Expenses: Not hard at all  Food Insecurity: No Food Insecurity (08/31/2023)   Hunger Vital Sign    Worried About Running Out of Food in the Last Year: Never true    Ran Out of Food in the Last Year: Never true  Transportation Needs: No Transportation Needs (08/31/2023)   PRAPARE - Administrator, Civil Service (Medical): No    Lack of Transportation (Non-Medical): No  Physical Activity: Inactive (07/23/2021)   Exercise Vital Sign    Days of Exercise per Week: 0 days    Minutes of Exercise per Session: 0 min  Stress: No Stress Concern Present (07/23/2021)   [employer] of Occupational Health - Occupational Stress Questionnaire    Feeling of Stress : Not at all  Social Connections: Moderately Integrated (08/31/2023)   Social Connection and Isolation Panel    Frequency of Communication with Friends and Family: Once a week    Frequency of Social Gatherings with Friends and Family: Never    Attends Religious Services: 1 to 4 times per year    Active Member of [employer] or Organizations: Yes    Attends Banker  Meetings: 1 to 4 times per year    Marital Status: Married  Catering manager Violence: Not At Risk (08/31/2023)   Humiliation, Afraid, Rape, and Kick questionnaire    Fear of Current or Ex-Partner: No    Emotionally Abused: No    Physically Abused: No    Sexually Abused: No   *** Family History  Problem Relation Age of Onset   Diabetes Mother    Hypertension Mother    Diabetes Father    Diabetes Brother    Hypertension Brother    Bone cancer Brother    Diabetes Brother     Current Outpatient Medications  Medication Sig Dispense Refill   acetaminophen  (TYLENOL ) 325 MG tablet Take 2 tablets (650 mg total) by mouth every 6 (six) hours as needed for mild pain (pain score 1-3), fever or headache.     atorvastatin  (LIPITOR) 20 MG tablet Take 1 tablet (20 mg total) by mouth every evening. (Patient taking differently: Take 20 mg by mouth daily after supper.) 90 tablet 2   budesonide -formoterol  (SYMBICORT ) 160-4.5 MCG/ACT inhaler INHALE 2 PUFFS INTO THE LUNGS TWICE A DAY 10.2 g 6   calcitRIOL (ROCALTROL) 0.25 MCG capsule Take 0.25 mcg by mouth 3 (three) times a week. Take one capsule at dinner after on Mon-Wed-Fri.     calcium  acetate (PHOSLO ) 667 MG capsule Take 1 capsule (667 mg total) by mouth 3 (three) times daily with meals.     clopidogrel  (PLAVIX ) 75 MG tablet Take 1 tablet (75 mg total) by mouth daily. 90 tablet 2   cromolyn  (OPTICROM ) 4 % ophthalmic solution Place 1 drop into both eyes every Wednesday. At bedtime     cyclobenzaprine  (FLEXERIL ) 5 MG tablet Take 1 tablet (5 mg total) by mouth 2 (two) times daily as needed for muscle spasms. 10 tablet 0   Darbepoetin Alfa  (ARANESP ) 100 MCG/0.5ML SOSY injection Inject 0.5 mLs (100 mcg total) into the skin every Thursday at 6pm.     diphenhydrAMINE  (BENADRYL ) 25 mg capsule Take 1 capsule (25 mg total) by mouth every 6 (six) hours as  needed for itching.     docusate sodium  (COLACE) 100 MG capsule Take 1 capsule (100 mg total) by mouth 2  (two) times daily. 60 capsule 2   ferrous sulfate  325 (65 FE) MG tablet Take 325 mg by mouth daily with breakfast.     insulin  aspart (NOVOLOG ) 100 UNIT/ML injection Inject 0-20 Units into the skin 3 (three) times daily with meals.     midodrine  (PROAMATINE ) 10 MG tablet Take 2 tablets (20 mg total) by mouth 3 (three) times daily with meals.     multivitamin (RENA-VIT) TABS tablet Take 1 tablet by mouth at bedtime.     senna-docusate (SENOKOT-S) 8.6-50 MG tablet Take 1 tablet by mouth 2 (two) times daily.     No current facility-administered medications for this visit.    No Known Allergies   REVIEW OF SYSTEMS:  *** [X]  denotes positive finding, [ ]  denotes negative finding Cardiac  Comments:  Chest pain or chest pressure:    Shortness of breath upon exertion:    Short of breath when lying flat:    Irregular heart rhythm:        Vascular    Pain in calf, thigh, or hip brought on by ambulation:    Pain in feet at night that wakes you up from your sleep:     Blood clot in your veins:    Leg swelling:         Pulmonary    Oxygen at home:    Productive cough:     Wheezing:         Neurologic    Sudden weakness in arms or legs:     Sudden numbness in arms or legs:     Sudden onset of difficulty speaking or slurred speech:    Temporary loss of vision in one eye:     Problems with dizziness:         Gastrointestinal    Blood in stool:     Vomited blood:         Genitourinary    Burning when urinating:     Blood in urine:        Psychiatric    Major depression:         Hematologic    Bleeding problems:    Problems with blood clotting too easily:        Skin    Rashes or ulcers:        Constitutional    Fever or chills:      PHYSICAL EXAMINATION:  There were no vitals filed for this visit.  General:  WDWN in NAD; vital signs documented above Gait: Not observed HENT: WNL, normocephalic Pulmonary: normal non-labored breathing , without wheezing Cardiac:  {Desc; regular/irreg:14544} HR Abdomen: soft, NT, no masses Skin: {With/Without:20273} rashes Vascular Exam/Pulses:  Right Left  Radial {Exam; arterial pulse strength 0-4:30167} {Exam; arterial pulse strength 0-4:30167}  Ulnar {Exam; arterial pulse strength 0-4:30167} {Exam; arterial pulse strength 0-4:30167}  Femoral {Exam; arterial pulse strength 0-4:30167} {Exam; arterial pulse strength 0-4:30167}  Popliteal {Exam; arterial pulse strength 0-4:30167} {Exam; arterial pulse strength 0-4:30167}  DP {Exam; arterial pulse strength 0-4:30167} {Exam; arterial pulse strength 0-4:30167}  PT {Exam; arterial pulse strength 0-4:30167} {Exam; arterial pulse strength 0-4:30167}   Extremities: {With/Without:20273} ischemic changes, {With/Without:20273} Gangrene , {With/Without:20273} cellulitis; {With/Without:20273} open wounds;  Musculoskeletal: no muscle wasting or atrophy  Neurologic: A&O X 3;  No focal weakness or paresthesias are detected Psychiatric:  The pt has {Desc; normal/abnormal:11317::Normal} affect.  Non-Invasive Vascular Imaging:   ***    ASSESSMENT/PLAN: Christian [name] is a 78 y.o. male presenting with ***   ***   Fonda FORBES Rim, MD [facility] [phone]

## 2023-10-28 ENCOUNTER — Encounter: Payer: Self-pay | Admitting: Vascular Surgery

## 2023-10-28 ENCOUNTER — Ambulatory Visit (HOSPITAL_COMMUNITY)
Admission: RE | Admit: 2023-10-28 | Discharge: 2023-10-28 | Disposition: A | Discharge status: Home / Self Care | Source: Ambulatory Visit | Attending: Physician Assistant | Admitting: Physician Assistant

## 2023-10-28 ENCOUNTER — Ambulatory Visit: Attending: Vascular Surgery | Admitting: Vascular Surgery

## 2023-10-28 VITALS — BP 127/78 | HR 102 | Temp 97.8°F

## 2023-10-28 DIAGNOSIS — Z992 Dependence on renal dialysis: Secondary | ICD-10-CM

## 2023-10-28 DIAGNOSIS — T82590A Other mechanical complication of surgically created arteriovenous fistula, initial encounter: Secondary | ICD-10-CM | POA: Diagnosis not present

## 2023-10-28 DIAGNOSIS — N186 End stage renal disease: Secondary | ICD-10-CM

## 2023-11-01 ENCOUNTER — Other Ambulatory Visit: Payer: Self-pay

## 2023-11-01 DIAGNOSIS — N186 End stage renal disease: Secondary | ICD-10-CM

## 2023-11-02 ENCOUNTER — Inpatient Hospital Stay: Admitting: Internal Medicine

## 2023-11-02 DIAGNOSIS — R0602 Shortness of breath: Secondary | ICD-10-CM | POA: Diagnosis not present

## 2023-11-02 DIAGNOSIS — R0989 Other specified symptoms and signs involving the circulatory and respiratory systems: Secondary | ICD-10-CM | POA: Diagnosis not present

## 2023-11-02 DIAGNOSIS — J189 Pneumonia, unspecified organism: Secondary | ICD-10-CM | POA: Diagnosis not present

## 2023-11-03 DIAGNOSIS — Z992 Dependence on renal dialysis: Secondary | ICD-10-CM | POA: Diagnosis not present

## 2023-11-03 DIAGNOSIS — M6281 Muscle weakness (generalized): Secondary | ICD-10-CM | POA: Diagnosis not present

## 2023-11-03 DIAGNOSIS — I4892 Unspecified atrial flutter: Secondary | ICD-10-CM | POA: Diagnosis not present

## 2023-11-03 DIAGNOSIS — K353 Acute appendicitis with localized peritonitis, without perforation or gangrene: Secondary | ICD-10-CM | POA: Diagnosis not present

## 2023-11-03 DIAGNOSIS — R2689 Other abnormalities of gait and mobility: Secondary | ICD-10-CM | POA: Diagnosis not present

## 2023-11-03 DIAGNOSIS — K37 Unspecified appendicitis: Secondary | ICD-10-CM | POA: Diagnosis not present

## 2023-11-05 DIAGNOSIS — M549 Dorsalgia, unspecified: Secondary | ICD-10-CM | POA: Diagnosis not present

## 2023-11-10 DIAGNOSIS — Z992 Dependence on renal dialysis: Secondary | ICD-10-CM | POA: Diagnosis not present

## 2023-11-10 DIAGNOSIS — K37 Unspecified appendicitis: Secondary | ICD-10-CM | POA: Diagnosis not present

## 2023-11-10 DIAGNOSIS — M6281 Muscle weakness (generalized): Secondary | ICD-10-CM | POA: Diagnosis not present

## 2023-11-10 DIAGNOSIS — K353 Acute appendicitis with localized peritonitis, without perforation or gangrene: Secondary | ICD-10-CM | POA: Diagnosis not present

## 2023-11-10 DIAGNOSIS — I4892 Unspecified atrial flutter: Secondary | ICD-10-CM | POA: Diagnosis not present

## 2023-11-10 DIAGNOSIS — R2689 Other abnormalities of gait and mobility: Secondary | ICD-10-CM | POA: Diagnosis not present

## 2023-11-12 DIAGNOSIS — R4702 Dysphasia: Secondary | ICD-10-CM | POA: Diagnosis not present

## 2023-11-15 DIAGNOSIS — R1310 Dysphagia, unspecified: Secondary | ICD-10-CM | POA: Diagnosis not present

## 2023-11-17 ENCOUNTER — Ambulatory Visit: Admitting: Podiatry

## 2023-11-17 ENCOUNTER — Other Ambulatory Visit: Payer: Self-pay | Admitting: Student

## 2023-11-17 ENCOUNTER — Ambulatory Visit: Admitting: Physician Assistant

## 2023-11-17 DIAGNOSIS — K37 Unspecified appendicitis: Secondary | ICD-10-CM | POA: Diagnosis not present

## 2023-11-17 DIAGNOSIS — I4892 Unspecified atrial flutter: Secondary | ICD-10-CM | POA: Diagnosis not present

## 2023-11-17 DIAGNOSIS — M6281 Muscle weakness (generalized): Secondary | ICD-10-CM | POA: Diagnosis not present

## 2023-11-17 DIAGNOSIS — R1030 Lower abdominal pain, unspecified: Secondary | ICD-10-CM

## 2023-11-17 DIAGNOSIS — Z992 Dependence on renal dialysis: Secondary | ICD-10-CM | POA: Diagnosis not present

## 2023-11-17 DIAGNOSIS — K353 Acute appendicitis with localized peritonitis, without perforation or gangrene: Secondary | ICD-10-CM | POA: Diagnosis not present

## 2023-11-17 DIAGNOSIS — R2689 Other abnormalities of gait and mobility: Secondary | ICD-10-CM | POA: Diagnosis not present

## 2023-11-19 DIAGNOSIS — E119 Type 2 diabetes mellitus without complications: Secondary | ICD-10-CM | POA: Diagnosis not present

## 2023-11-19 DIAGNOSIS — E785 Hyperlipidemia, unspecified: Secondary | ICD-10-CM | POA: Diagnosis not present

## 2023-11-22 DIAGNOSIS — R1310 Dysphagia, unspecified: Secondary | ICD-10-CM | POA: Diagnosis not present

## 2023-11-23 ENCOUNTER — Other Ambulatory Visit (HOSPITAL_COMMUNITY): Payer: Self-pay | Admitting: Student

## 2023-11-23 DIAGNOSIS — Z9049 Acquired absence of other specified parts of digestive tract: Secondary | ICD-10-CM

## 2023-11-23 DIAGNOSIS — R1030 Lower abdominal pain, unspecified: Secondary | ICD-10-CM

## 2023-11-24 DIAGNOSIS — M6281 Muscle weakness (generalized): Secondary | ICD-10-CM | POA: Diagnosis not present

## 2023-11-24 DIAGNOSIS — K353 Acute appendicitis with localized peritonitis, without perforation or gangrene: Secondary | ICD-10-CM | POA: Diagnosis not present

## 2023-11-24 DIAGNOSIS — I4892 Unspecified atrial flutter: Secondary | ICD-10-CM | POA: Diagnosis not present

## 2023-11-24 DIAGNOSIS — R2689 Other abnormalities of gait and mobility: Secondary | ICD-10-CM | POA: Diagnosis not present

## 2023-11-24 DIAGNOSIS — K37 Unspecified appendicitis: Secondary | ICD-10-CM | POA: Diagnosis not present

## 2023-11-24 DIAGNOSIS — Z992 Dependence on renal dialysis: Secondary | ICD-10-CM | POA: Diagnosis not present

## 2023-11-25 ENCOUNTER — Ambulatory Visit (HOSPITAL_BASED_OUTPATIENT_CLINIC_OR_DEPARTMENT_OTHER)

## 2023-11-25 ENCOUNTER — Encounter (HOSPITAL_COMMUNITY): Payer: Self-pay

## 2023-11-25 ENCOUNTER — Ambulatory Visit (HOSPITAL_COMMUNITY): Admission: RE | Admit: 2023-11-25 | Source: Ambulatory Visit

## 2023-11-25 ENCOUNTER — Ambulatory Visit (HOSPITAL_COMMUNITY)
Admission: RE | Admit: 2023-11-25 | Discharge: 2023-11-25 | Disposition: A | Discharge status: Home / Self Care | Source: Ambulatory Visit | Attending: Student | Admitting: Student

## 2023-11-25 ENCOUNTER — Other Ambulatory Visit (HOSPITAL_COMMUNITY)

## 2023-11-25 DIAGNOSIS — Z992 Dependence on renal dialysis: Secondary | ICD-10-CM | POA: Diagnosis not present

## 2023-11-25 DIAGNOSIS — R1030 Lower abdominal pain, unspecified: Secondary | ICD-10-CM | POA: Insufficient documentation

## 2023-11-25 DIAGNOSIS — Z9049 Acquired absence of other specified parts of digestive tract: Secondary | ICD-10-CM | POA: Diagnosis not present

## 2023-11-25 DIAGNOSIS — N186 End stage renal disease: Secondary | ICD-10-CM | POA: Diagnosis not present

## 2023-11-25 DIAGNOSIS — K5730 Diverticulosis of large intestine without perforation or abscess without bleeding: Secondary | ICD-10-CM | POA: Diagnosis not present

## 2023-11-25 DIAGNOSIS — K429 Umbilical hernia without obstruction or gangrene: Secondary | ICD-10-CM | POA: Diagnosis not present

## 2023-11-25 DIAGNOSIS — N179 Acute kidney failure, unspecified: Secondary | ICD-10-CM | POA: Diagnosis not present

## 2023-11-25 MED ORDER — IOHEXOL 9 MG/ML PO SOLN
ORAL | Status: AC
Start: 2023-11-25 — End: 2023-11-25
  Filled 2023-11-25: qty 1000

## 2023-12-01 DIAGNOSIS — M549 Dorsalgia, unspecified: Secondary | ICD-10-CM | POA: Diagnosis not present

## 2023-12-01 DIAGNOSIS — R1310 Dysphagia, unspecified: Secondary | ICD-10-CM | POA: Diagnosis not present

## 2023-12-01 DIAGNOSIS — E119 Type 2 diabetes mellitus without complications: Secondary | ICD-10-CM | POA: Diagnosis not present

## 2023-12-01 DIAGNOSIS — N186 End stage renal disease: Secondary | ICD-10-CM | POA: Diagnosis not present

## 2023-12-01 DIAGNOSIS — R4702 Dysphasia: Secondary | ICD-10-CM | POA: Diagnosis not present

## 2023-12-01 DIAGNOSIS — E785 Hyperlipidemia, unspecified: Secondary | ICD-10-CM | POA: Diagnosis not present

## 2023-12-05 DIAGNOSIS — E1169 Type 2 diabetes mellitus with other specified complication: Secondary | ICD-10-CM | POA: Diagnosis not present

## 2023-12-05 DIAGNOSIS — I480 Paroxysmal atrial fibrillation: Secondary | ICD-10-CM | POA: Diagnosis not present

## 2023-12-05 DIAGNOSIS — F02A11 Dementia in other diseases classified elsewhere, mild, with agitation: Secondary | ICD-10-CM | POA: Diagnosis not present

## 2023-12-05 DIAGNOSIS — E1142 Type 2 diabetes mellitus with diabetic polyneuropathy: Secondary | ICD-10-CM | POA: Diagnosis not present

## 2023-12-05 DIAGNOSIS — E1122 Type 2 diabetes mellitus with diabetic chronic kidney disease: Secondary | ICD-10-CM | POA: Diagnosis not present

## 2023-12-05 DIAGNOSIS — I509 Heart failure, unspecified: Secondary | ICD-10-CM | POA: Diagnosis not present

## 2023-12-05 DIAGNOSIS — Z7984 Long term (current) use of oral hypoglycemic drugs: Secondary | ICD-10-CM | POA: Diagnosis not present

## 2023-12-05 DIAGNOSIS — N401 Enlarged prostate with lower urinary tract symptoms: Secondary | ICD-10-CM | POA: Diagnosis not present

## 2023-12-05 DIAGNOSIS — G35 Multiple sclerosis: Secondary | ICD-10-CM | POA: Diagnosis not present

## 2023-12-05 DIAGNOSIS — I252 Old myocardial infarction: Secondary | ICD-10-CM | POA: Diagnosis not present

## 2023-12-05 DIAGNOSIS — E7800 Pure hypercholesterolemia, unspecified: Secondary | ICD-10-CM | POA: Diagnosis not present

## 2023-12-05 DIAGNOSIS — I132 Hypertensive heart and chronic kidney disease with heart failure and with stage 5 chronic kidney disease, or end stage renal disease: Secondary | ICD-10-CM | POA: Diagnosis not present

## 2023-12-05 DIAGNOSIS — I4892 Unspecified atrial flutter: Secondary | ICD-10-CM | POA: Diagnosis not present

## 2023-12-05 DIAGNOSIS — I700 Atherosclerosis of aorta: Secondary | ICD-10-CM | POA: Diagnosis not present

## 2023-12-05 DIAGNOSIS — Z8673 Personal history of transient ischemic attack (TIA), and cerebral infarction without residual deficits: Secondary | ICD-10-CM | POA: Diagnosis not present

## 2023-12-05 DIAGNOSIS — I959 Hypotension, unspecified: Secondary | ICD-10-CM | POA: Diagnosis not present

## 2023-12-05 DIAGNOSIS — I255 Ischemic cardiomyopathy: Secondary | ICD-10-CM | POA: Diagnosis not present

## 2023-12-05 DIAGNOSIS — Z992 Dependence on renal dialysis: Secondary | ICD-10-CM | POA: Diagnosis not present

## 2023-12-05 DIAGNOSIS — R1310 Dysphagia, unspecified: Secondary | ICD-10-CM | POA: Diagnosis not present

## 2023-12-05 DIAGNOSIS — Z7902 Long term (current) use of antithrombotics/antiplatelets: Secondary | ICD-10-CM | POA: Diagnosis not present

## 2023-12-05 DIAGNOSIS — N39498 Other specified urinary incontinence: Secondary | ICD-10-CM | POA: Diagnosis not present

## 2023-12-05 DIAGNOSIS — I2510 Atherosclerotic heart disease of native coronary artery without angina pectoris: Secondary | ICD-10-CM | POA: Diagnosis not present

## 2023-12-05 DIAGNOSIS — D631 Anemia in chronic kidney disease: Secondary | ICD-10-CM | POA: Diagnosis not present

## 2023-12-05 DIAGNOSIS — N186 End stage renal disease: Secondary | ICD-10-CM | POA: Diagnosis not present

## 2023-12-06 ENCOUNTER — Telehealth: Payer: Self-pay

## 2023-12-06 NOTE — Telephone Encounter (Signed)
 Copied from CRM [id number]. Topic: Clinical - Home Health Verbal Orders >> Dec 06, 2023 11:59 AM Aleatha BROCKS wrote: Caller/Agency: [facility] Callback Number: [phone] Service Requested: Skilled Nursing Frequency: 1 week 3, 2 month 1, 2prn visits  Any new concerns about the patient? No, but wife needs to contact dr for medications

## 2023-12-06 NOTE — Telephone Encounter (Signed)
 Copied from CRM [id number]. Topic: General - Other >> Dec 06, 2023  2:42 PM Chiquita SQUIBB wrote: Reason for CRM: Lauraine from [facility] stated the patient was in the hospital starting in May and discharged on August 1st, and would like to let the doctor know that they can not get a hold of the patient, so they wanted to let the doctor know that was going on.

## 2023-12-06 NOTE — Telephone Encounter (Signed)
 I will need info on start of service date for these [facility] orders. I do not have visit to justify and he was in the hospital quite some time ago so depending on start date I may or may not be able to certify even with upcoming apt with me

## 2023-12-06 NOTE — Telephone Encounter (Signed)
 Copied from CRM [id number]. Topic: Clinical - Home Health Verbal Orders >> Dec 06, 2023 12:01 PM Aleatha C wrote: Caller/Agency: [facility]  Callback Number: [phone] Requested: Speech Therapy for cognitive and swolling  Frequency:  1 week 1 for evalution and treatment  Any new concerns about the patient? No

## 2023-12-06 NOTE — Telephone Encounter (Signed)
 Pt has hospital f/u 8/18

## 2023-12-09 DIAGNOSIS — N39498 Other specified urinary incontinence: Secondary | ICD-10-CM | POA: Diagnosis not present

## 2023-12-09 DIAGNOSIS — I959 Hypotension, unspecified: Secondary | ICD-10-CM | POA: Diagnosis not present

## 2023-12-09 DIAGNOSIS — N401 Enlarged prostate with lower urinary tract symptoms: Secondary | ICD-10-CM | POA: Diagnosis not present

## 2023-12-09 DIAGNOSIS — Z992 Dependence on renal dialysis: Secondary | ICD-10-CM | POA: Diagnosis not present

## 2023-12-09 DIAGNOSIS — Z7984 Long term (current) use of oral hypoglycemic drugs: Secondary | ICD-10-CM | POA: Diagnosis not present

## 2023-12-09 DIAGNOSIS — Z7902 Long term (current) use of antithrombotics/antiplatelets: Secondary | ICD-10-CM | POA: Diagnosis not present

## 2023-12-09 DIAGNOSIS — E7800 Pure hypercholesterolemia, unspecified: Secondary | ICD-10-CM | POA: Diagnosis not present

## 2023-12-09 DIAGNOSIS — I132 Hypertensive heart and chronic kidney disease with heart failure and with stage 5 chronic kidney disease, or end stage renal disease: Secondary | ICD-10-CM | POA: Diagnosis not present

## 2023-12-09 DIAGNOSIS — R1310 Dysphagia, unspecified: Secondary | ICD-10-CM | POA: Diagnosis not present

## 2023-12-09 DIAGNOSIS — Z8673 Personal history of transient ischemic attack (TIA), and cerebral infarction without residual deficits: Secondary | ICD-10-CM | POA: Diagnosis not present

## 2023-12-09 DIAGNOSIS — I255 Ischemic cardiomyopathy: Secondary | ICD-10-CM | POA: Diagnosis not present

## 2023-12-09 DIAGNOSIS — N186 End stage renal disease: Secondary | ICD-10-CM | POA: Diagnosis not present

## 2023-12-09 DIAGNOSIS — I252 Old myocardial infarction: Secondary | ICD-10-CM | POA: Diagnosis not present

## 2023-12-09 DIAGNOSIS — I4892 Unspecified atrial flutter: Secondary | ICD-10-CM | POA: Diagnosis not present

## 2023-12-09 DIAGNOSIS — F02A11 Dementia in other diseases classified elsewhere, mild, with agitation: Secondary | ICD-10-CM | POA: Diagnosis not present

## 2023-12-09 DIAGNOSIS — I700 Atherosclerosis of aorta: Secondary | ICD-10-CM | POA: Diagnosis not present

## 2023-12-09 DIAGNOSIS — I480 Paroxysmal atrial fibrillation: Secondary | ICD-10-CM | POA: Diagnosis not present

## 2023-12-09 DIAGNOSIS — I509 Heart failure, unspecified: Secondary | ICD-10-CM | POA: Diagnosis not present

## 2023-12-09 DIAGNOSIS — E1169 Type 2 diabetes mellitus with other specified complication: Secondary | ICD-10-CM | POA: Diagnosis not present

## 2023-12-09 DIAGNOSIS — G35 Multiple sclerosis: Secondary | ICD-10-CM | POA: Diagnosis not present

## 2023-12-09 DIAGNOSIS — E1122 Type 2 diabetes mellitus with diabetic chronic kidney disease: Secondary | ICD-10-CM | POA: Diagnosis not present

## 2023-12-09 DIAGNOSIS — E1142 Type 2 diabetes mellitus with diabetic polyneuropathy: Secondary | ICD-10-CM | POA: Diagnosis not present

## 2023-12-09 DIAGNOSIS — I2510 Atherosclerotic heart disease of native coronary artery without angina pectoris: Secondary | ICD-10-CM | POA: Diagnosis not present

## 2023-12-09 DIAGNOSIS — D631 Anemia in chronic kidney disease: Secondary | ICD-10-CM | POA: Diagnosis not present

## 2023-12-13 NOTE — Telephone Encounter (Signed)
 Copied from CRM #[id number]. Topic: Clinical - Home Health Verbal Orders >> Dec 10, 2023  3:42 PM Macario HERO wrote: Caller/Agency: Rocky from [facility] Callback Number: [phone] Service Requested: Physical Therapy Frequency: 1 week 9 Any new concerns about the patient? No

## 2023-12-13 NOTE — Telephone Encounter (Signed)
 See below but I cannot certify currently. I see started on 10th but need to know a date so would need month. Depending I may or may not be able to certify even after visit upcoming.

## 2023-12-14 ENCOUNTER — Encounter: Payer: Self-pay | Admitting: Internal Medicine

## 2023-12-14 ENCOUNTER — Ambulatory Visit (INDEPENDENT_AMBULATORY_CARE_PROVIDER_SITE_OTHER): Admitting: Internal Medicine

## 2023-12-14 VITALS — BP 124/82 | HR 79 | Temp 97.7°F | Ht 69.0 in

## 2023-12-14 DIAGNOSIS — F03A0 Unspecified dementia, mild, without behavioral disturbance, psychotic disturbance, mood disturbance, and anxiety: Secondary | ICD-10-CM

## 2023-12-14 DIAGNOSIS — E440 Moderate protein-calorie malnutrition: Secondary | ICD-10-CM

## 2023-12-14 DIAGNOSIS — D631 Anemia in chronic kidney disease: Secondary | ICD-10-CM

## 2023-12-14 DIAGNOSIS — E785 Hyperlipidemia, unspecified: Secondary | ICD-10-CM

## 2023-12-14 DIAGNOSIS — Z794 Long term (current) use of insulin: Secondary | ICD-10-CM

## 2023-12-14 DIAGNOSIS — N186 End stage renal disease: Secondary | ICD-10-CM

## 2023-12-14 DIAGNOSIS — I5032 Chronic diastolic (congestive) heart failure: Secondary | ICD-10-CM | POA: Diagnosis not present

## 2023-12-14 DIAGNOSIS — E1122 Type 2 diabetes mellitus with diabetic chronic kidney disease: Secondary | ICD-10-CM

## 2023-12-14 DIAGNOSIS — E1169 Type 2 diabetes mellitus with other specified complication: Secondary | ICD-10-CM

## 2023-12-14 DIAGNOSIS — Z992 Dependence on renal dialysis: Secondary | ICD-10-CM

## 2023-12-14 LAB — HEMOGLOBIN A1C: Hgb A1c MFr Bld: 4.8 % (ref 4.6–6.5)

## 2023-12-14 LAB — LIPID PANEL
Cholesterol: 151 mg/dL (ref 0–200)
HDL: 73.1 mg/dL (ref >39.00)
LDL Cholesterol: 56 mg/dL (ref 0–99)
NonHDL: 78.37
Total CHOL/HDL Ratio: 2
Triglycerides: 111 mg/dL (ref 0.0–149.0)
VLDL: 22.2 mg/dL (ref 0.0–40.0)

## 2023-12-14 NOTE — Telephone Encounter (Signed)
 HH health  orders started on 12/05/2023

## 2023-12-14 NOTE — Progress Notes (Unsigned)
 Subjective:   Patient ID: Christian [name], male    DOB: [birth year], 78 y.o.   MRN: [id number]  Discussed the use of AI scribe software for clinical note transcription with the patient, who gave verbal consent to proceed. History of Present Illness Christian [name] is a 78 year old male who presents for follow-up after hospitalization and rehabilitation.  He recently underwent an appendectomy, resulting in a prolonged hospital stay of one month. He was transferred to a rehabilitation facility on June 7th or 8th, 2025, and discharged home on July 31st, 2025. During his rehabilitation stay, he experienced difficulties with mobility and required assistance for transfers.  He has a history of open heart surgery in August 2021. Initially, he required a Hoyer lift for transfers post-surgery but later improved and no longer needed it. However, following his recent hospitalization, his mobility has declined, and he now requires assistance again. He needs a hospital bed and a more efficient transfer device, as the current Trout lift requires two people to operate.  He is currently undergoing dialysis and reports that it does not bother him, but sitting in the dialysis chair is uncomfortable. He has experienced weight loss and decreased appetite since his hospitalization, preferring protein shakes over full meals. He reports occasional stomach pain and constipation, for which he takes 'two To, to some pluses.'  He was previously on glyburide for diabetes management but has been on insulin  shots since his hospitalization. However, he has not been using insulin  pens at home. His blood sugar was 85 when last checked, and he has not been experiencing significant issues with blood sugar control.  He experiences itching, which he manages with Benadryl . No significant breathing problems or chest pain currently, although he had some issues while at [facility], where he was on oxygen. He does not currently use oxygen  at home.  The patient requires a hospital bed because the patient requires the head of the bed to be elevated >30 degrees most of the time due to the medical conditions listed in the chart. Pillows and wedges have been considered and ruled out.SABRAHe requires the ability to reposition frequently and immediately.   The patient requires a regular hospital bed.  A gel overlay is required as the patient has limited mobility AND the patient has an impaired nutritional status.  Review of Systems  Constitutional:  Positive for activity change, appetite change, fatigue and unexpected weight change.  HENT: Negative.    Eyes: Negative.   Respiratory:  Negative for cough, chest tightness and shortness of breath.   Cardiovascular:  Negative for chest pain, palpitations and leg swelling.  Gastrointestinal:  Negative for abdominal distention, abdominal pain, constipation, diarrhea, nausea and vomiting.  Musculoskeletal:  Positive for arthralgias, gait problem and myalgias.  Skin: Negative.   Neurological:  Positive for weakness.  Psychiatric/Behavioral: Negative.      Objective:  Physical Exam Constitutional:      Appearance: He is well-developed.     Comments: Temporal wasting  HENT:     Head: Normocephalic and atraumatic.  Cardiovascular:     Rate and Rhythm: Normal rate and regular rhythm.  Pulmonary:     Effort: Pulmonary effort is normal. No respiratory distress.     Breath sounds: Normal breath sounds. No wheezing or rales.  Abdominal:     General: Bowel sounds are normal. There is no distension.     Palpations: Abdomen is soft.     Tenderness: There is no abdominal tenderness. There is no  rebound.  Musculoskeletal:     Cervical back: Normal range of motion.     Right lower leg: No edema.     Left lower leg: No edema.  Skin:    General: Skin is warm and dry.  Neurological:     Mental Status: He is alert and oriented to person, place, and time.     Motor: Weakness present.      Coordination: Coordination abnormal.     Comments: Some memory changes and discrepancies in timeline provided by patient and wife although this provider is unsure which timeline is accurate     Vitals:   12/14/23 1426  BP: 124/82  Pulse: 79  Temp: 97.7 F (36.5 C)  TempSrc: Oral  SpO2: 98%  Height: 5' 9 (1.753 m)    Assessment and Plan Assessment & Plan End stage renal disease on hemodialysis   He undergoes hemodialysis with discomfort from sitting, but pruritus has improved. Blood pressure medications are adjusted due to hypotension, and blood sugars are monitored during dialysis. Continue hemodialysis as scheduled. Check hemoglobin A1c. Discontinue insulin  shots and finger pricks unless blood sugar levels are concerning. Monitor for pruritus and manage with Benadryl  as needed.  Type 2 diabetes mellitus with diabetic chronic kidney disease   Previously on glyburide, he used insulin  during a hospital stay, but not for long-term. Dialysis may aid in blood sugar control, possibly reducing the need for diabetes medication. Check hemoglobin A1c. Discontinue insulin  shots and finger pricks unless blood sugar levels are concerning. Monitor blood sugar levels during dialysis.  Chronic diastolic heart failure Using dialysis for volume control and weights stable at dialysis.   Protein-calorie malnutrition and weight loss   He has significant weight loss with a preference for protein shakes, raising concern for muscle loss due to inadequate nutrition. Encourage intake of protein shakes as recommended by the dietitian. Monitor weight and nutritional status.  Impaired mobility and need for home health services   Impaired mobility requires assistance. The existing Deitra lift requires two people, and he needs a hospital bed or mattress for mobility and comfort. Order a hospital bed, understanding insurance may not cover it. Contact home health company for therapy and assistance. Consider purchasing  a more manageable lift device if insurance does not cover it.  Constipation  Pruritus related to end stage renal disease   Pruritus has improved with dialysis and is managed with Benadryl . Continue Benadryl  for pruritus management as needed.  I personally spent a total of 45 minutes in the care of the patient today including getting/reviewing separately obtained history, performing a medically appropriate exam/evaluation, counseling and educating, and placing orders.

## 2023-12-14 NOTE — Telephone Encounter (Signed)
 Ok for [employer]

## 2023-12-14 NOTE — Patient Instructions (Addendum)
 We will stop the insulin  shots and stop regular blood sugar checks.  You can check the sugars if you seem confused or it may be low.  We will check the labs today.  We will order the hospital bed and have our social worker contact you guys to help with getting long term aide. We will call the home health company to have them come out to help.

## 2023-12-15 ENCOUNTER — Telehealth: Payer: Self-pay | Admitting: *Deleted

## 2023-12-15 ENCOUNTER — Ambulatory Visit: Payer: Self-pay | Admitting: Internal Medicine

## 2023-12-15 ENCOUNTER — Telehealth: Payer: Self-pay | Admitting: Internal Medicine

## 2023-12-15 NOTE — Telephone Encounter (Signed)
 Called both Physical and Speech therapy for all verbals have been ok by the provider

## 2023-12-15 NOTE — Assessment & Plan Note (Signed)
 On dialysis and stable. On midodrine  for low BP.

## 2023-12-15 NOTE — Assessment & Plan Note (Signed)
Checking lipid panel and adjust lipitor as needed.  

## 2023-12-15 NOTE — Assessment & Plan Note (Signed)
 With temporal wasting, low protein levels in labs from hospital without follow up. Checking albumin . He is not consistently doing protein drinks have advised him to do daily. Weight is down significantly based on appearance (due to fragility he was unable to get on scale).

## 2023-12-15 NOTE — Assessment & Plan Note (Signed)
 Monitoring with dialysis and on aranesp  shots.

## 2023-12-15 NOTE — Telephone Encounter (Signed)
 Medication list from after visit summary correct. Only change at visit was stop novolog .

## 2023-12-15 NOTE — Assessment & Plan Note (Signed)
 Checking HgA1c and lipid panel. Stop novolog  shots and reduce sugar checks to as needed for symptoms only. He is dialyzing sugar with dialysis and may not need further medication at this time.

## 2023-12-15 NOTE — Progress Notes (Signed)
 Complex Care Management Note  Care Guide Note 12/15/2023 Name: Christian [name] MRN: [id number] DOB: Aug 04, 1945  Christian [name] is a 78 y.o. year old male who sees Rollene Almarie LABOR, MD for primary care. I reached out to [name] and wife by phone today to offer complex care management services.  [name] was given information about Complex Care Management services today including:   The Complex Care Management services include support from the care team which includes your Nurse Care Manager, Clinical Social Worker, or Pharmacist.  The Complex Care Management team is here to help remove barriers to the health concerns and goals most important to you. Complex Care Management services are voluntary, and the patient may decline or stop services at any time by request to their care team member.   Complex Care Management Consent Status: Patient agreed to services and verbal consent obtained.   Follow up plan:  Telephone appointment with complex care management team member scheduled for:  12/21/2023  Encounter Outcome:  Patient Scheduled  Thedford Franks, CMA   [facility], [facility] Guide Direct Dial : [phone]  Fax: [phone] Website: [email or web address]

## 2023-12-15 NOTE — Telephone Encounter (Signed)
 Called patient back and went over her concerns that she was having and wanted a better explanation of what ESRD. Christian [name] was able to advise me to advise the patient and patient was worried about what this meant and to also clarify on what certain medication to continue take and not take

## 2023-12-15 NOTE — Telephone Encounter (Signed)
 Copied from CRM [id number]. Topic: Clinical - Medication Question >> Dec 15, 2023 10:03 AM Aleatha BROCKS wrote: Reason for CRM: Patient wife would like to know do they resume with the medication list since patient had a stint put in or no, please give wife a call [phone]

## 2023-12-15 NOTE — Assessment & Plan Note (Signed)
 This seems to be progressed from earlier this year with recent serious illness. It is unclear to this provider which timeline presented was accurate and there are some discrepancies with medication administration between staff members here. We feel we have the medication list accurate during visit.

## 2023-12-15 NOTE — Assessment & Plan Note (Addendum)
 No flare today and dialysis is handling volume management BP is unable to tolerate any BP lowering medications at this time. Needs hospital bed and referral to social worker.

## 2023-12-15 NOTE — Telephone Encounter (Signed)
**Note De-identified  [place] Obfuscation** Please advise 

## 2023-12-16 ENCOUNTER — Telehealth: Payer: Self-pay

## 2023-12-16 DIAGNOSIS — Z7902 Long term (current) use of antithrombotics/antiplatelets: Secondary | ICD-10-CM | POA: Diagnosis not present

## 2023-12-16 DIAGNOSIS — I700 Atherosclerosis of aorta: Secondary | ICD-10-CM | POA: Diagnosis not present

## 2023-12-16 DIAGNOSIS — N39498 Other specified urinary incontinence: Secondary | ICD-10-CM | POA: Diagnosis not present

## 2023-12-16 DIAGNOSIS — Z8673 Personal history of transient ischemic attack (TIA), and cerebral infarction without residual deficits: Secondary | ICD-10-CM | POA: Diagnosis not present

## 2023-12-16 DIAGNOSIS — N401 Enlarged prostate with lower urinary tract symptoms: Secondary | ICD-10-CM | POA: Diagnosis not present

## 2023-12-16 DIAGNOSIS — I509 Heart failure, unspecified: Secondary | ICD-10-CM | POA: Diagnosis not present

## 2023-12-16 DIAGNOSIS — I2510 Atherosclerotic heart disease of native coronary artery without angina pectoris: Secondary | ICD-10-CM | POA: Diagnosis not present

## 2023-12-16 DIAGNOSIS — I4892 Unspecified atrial flutter: Secondary | ICD-10-CM | POA: Diagnosis not present

## 2023-12-16 DIAGNOSIS — E1142 Type 2 diabetes mellitus with diabetic polyneuropathy: Secondary | ICD-10-CM | POA: Diagnosis not present

## 2023-12-16 DIAGNOSIS — E7800 Pure hypercholesterolemia, unspecified: Secondary | ICD-10-CM | POA: Diagnosis not present

## 2023-12-16 DIAGNOSIS — F02A11 Dementia in other diseases classified elsewhere, mild, with agitation: Secondary | ICD-10-CM | POA: Diagnosis not present

## 2023-12-16 DIAGNOSIS — E1122 Type 2 diabetes mellitus with diabetic chronic kidney disease: Secondary | ICD-10-CM | POA: Diagnosis not present

## 2023-12-16 DIAGNOSIS — N186 End stage renal disease: Secondary | ICD-10-CM | POA: Diagnosis not present

## 2023-12-16 DIAGNOSIS — R1310 Dysphagia, unspecified: Secondary | ICD-10-CM | POA: Diagnosis not present

## 2023-12-16 DIAGNOSIS — I252 Old myocardial infarction: Secondary | ICD-10-CM | POA: Diagnosis not present

## 2023-12-16 DIAGNOSIS — I255 Ischemic cardiomyopathy: Secondary | ICD-10-CM | POA: Diagnosis not present

## 2023-12-16 DIAGNOSIS — Z992 Dependence on renal dialysis: Secondary | ICD-10-CM | POA: Diagnosis not present

## 2023-12-16 DIAGNOSIS — D631 Anemia in chronic kidney disease: Secondary | ICD-10-CM | POA: Diagnosis not present

## 2023-12-16 DIAGNOSIS — I480 Paroxysmal atrial fibrillation: Secondary | ICD-10-CM | POA: Diagnosis not present

## 2023-12-16 DIAGNOSIS — I959 Hypotension, unspecified: Secondary | ICD-10-CM | POA: Diagnosis not present

## 2023-12-16 DIAGNOSIS — Z7984 Long term (current) use of oral hypoglycemic drugs: Secondary | ICD-10-CM | POA: Diagnosis not present

## 2023-12-16 DIAGNOSIS — E1169 Type 2 diabetes mellitus with other specified complication: Secondary | ICD-10-CM | POA: Diagnosis not present

## 2023-12-16 DIAGNOSIS — I132 Hypertensive heart and chronic kidney disease with heart failure and with stage 5 chronic kidney disease, or end stage renal disease: Secondary | ICD-10-CM | POA: Diagnosis not present

## 2023-12-16 DIAGNOSIS — G35 Multiple sclerosis: Secondary | ICD-10-CM | POA: Diagnosis not present

## 2023-12-16 NOTE — Telephone Encounter (Signed)
 [facility] Client Coordination Note Report Physicians Orders- 12/09/2023 Fax sent back successfully

## 2023-12-17 ENCOUNTER — Telehealth: Payer: Self-pay

## 2023-12-17 NOTE — Telephone Encounter (Signed)
 Copied from CRM #[id number]. Topic: Clinical - Home Health Verbal Orders >> Dec 17, 2023  1:07 PM Thersia BROCKS wrote: Caller/Agency: Jim/ [facility]  Callback Number: [phone] Service Requested: Occupational Therapy Frequency: 1 time a week for 8 weeks  Any new concerns about the patient? No

## 2023-12-17 NOTE — Telephone Encounter (Signed)
 Copied from CRM #[id number]. Topic: Clinical - Prescription Issue >> Dec 17, 2023  1:08 PM Thersia BROCKS wrote:  Reason for CRM: Signe Edison from [facility] called in regarding patient needing a prescription for a sliding board and drop arm potty chair

## 2023-12-17 NOTE — Telephone Encounter (Signed)
**Note De-identified  [place] Obfuscation** Please advise 

## 2023-12-20 ENCOUNTER — Ambulatory Visit: Payer: Medicare HMO | Admitting: Physician Assistant

## 2023-12-20 DIAGNOSIS — N186 End stage renal disease: Secondary | ICD-10-CM | POA: Diagnosis not present

## 2023-12-20 DIAGNOSIS — E639 Nutritional deficiency, unspecified: Secondary | ICD-10-CM | POA: Diagnosis not present

## 2023-12-20 DIAGNOSIS — I509 Heart failure, unspecified: Secondary | ICD-10-CM | POA: Diagnosis not present

## 2023-12-20 DIAGNOSIS — E1142 Type 2 diabetes mellitus with diabetic polyneuropathy: Secondary | ICD-10-CM | POA: Diagnosis not present

## 2023-12-20 NOTE — Telephone Encounter (Signed)
 Ok for [employer]

## 2023-12-21 ENCOUNTER — Other Ambulatory Visit: Payer: Self-pay

## 2023-12-21 DIAGNOSIS — I959 Hypotension, unspecified: Secondary | ICD-10-CM | POA: Diagnosis not present

## 2023-12-21 DIAGNOSIS — I255 Ischemic cardiomyopathy: Secondary | ICD-10-CM | POA: Diagnosis not present

## 2023-12-21 DIAGNOSIS — D631 Anemia in chronic kidney disease: Secondary | ICD-10-CM | POA: Diagnosis not present

## 2023-12-21 DIAGNOSIS — I4892 Unspecified atrial flutter: Secondary | ICD-10-CM | POA: Diagnosis not present

## 2023-12-21 DIAGNOSIS — F02A11 Dementia in other diseases classified elsewhere, mild, with agitation: Secondary | ICD-10-CM | POA: Diagnosis not present

## 2023-12-21 DIAGNOSIS — N39498 Other specified urinary incontinence: Secondary | ICD-10-CM | POA: Diagnosis not present

## 2023-12-21 DIAGNOSIS — I509 Heart failure, unspecified: Secondary | ICD-10-CM | POA: Diagnosis not present

## 2023-12-21 DIAGNOSIS — N186 End stage renal disease: Secondary | ICD-10-CM | POA: Diagnosis not present

## 2023-12-21 DIAGNOSIS — N401 Enlarged prostate with lower urinary tract symptoms: Secondary | ICD-10-CM | POA: Diagnosis not present

## 2023-12-21 DIAGNOSIS — G35 Multiple sclerosis: Secondary | ICD-10-CM | POA: Diagnosis not present

## 2023-12-21 DIAGNOSIS — I480 Paroxysmal atrial fibrillation: Secondary | ICD-10-CM | POA: Diagnosis not present

## 2023-12-21 DIAGNOSIS — E7800 Pure hypercholesterolemia, unspecified: Secondary | ICD-10-CM | POA: Diagnosis not present

## 2023-12-21 DIAGNOSIS — Z992 Dependence on renal dialysis: Secondary | ICD-10-CM | POA: Diagnosis not present

## 2023-12-21 DIAGNOSIS — I700 Atherosclerosis of aorta: Secondary | ICD-10-CM | POA: Diagnosis not present

## 2023-12-21 DIAGNOSIS — E1122 Type 2 diabetes mellitus with diabetic chronic kidney disease: Secondary | ICD-10-CM | POA: Diagnosis not present

## 2023-12-21 DIAGNOSIS — Z8673 Personal history of transient ischemic attack (TIA), and cerebral infarction without residual deficits: Secondary | ICD-10-CM | POA: Diagnosis not present

## 2023-12-21 DIAGNOSIS — I132 Hypertensive heart and chronic kidney disease with heart failure and with stage 5 chronic kidney disease, or end stage renal disease: Secondary | ICD-10-CM | POA: Diagnosis not present

## 2023-12-21 DIAGNOSIS — Z7984 Long term (current) use of oral hypoglycemic drugs: Secondary | ICD-10-CM | POA: Diagnosis not present

## 2023-12-21 DIAGNOSIS — I2510 Atherosclerotic heart disease of native coronary artery without angina pectoris: Secondary | ICD-10-CM | POA: Diagnosis not present

## 2023-12-21 DIAGNOSIS — R1310 Dysphagia, unspecified: Secondary | ICD-10-CM | POA: Diagnosis not present

## 2023-12-21 DIAGNOSIS — I252 Old myocardial infarction: Secondary | ICD-10-CM | POA: Diagnosis not present

## 2023-12-21 DIAGNOSIS — Z7902 Long term (current) use of antithrombotics/antiplatelets: Secondary | ICD-10-CM | POA: Diagnosis not present

## 2023-12-21 DIAGNOSIS — E1142 Type 2 diabetes mellitus with diabetic polyneuropathy: Secondary | ICD-10-CM | POA: Diagnosis not present

## 2023-12-21 DIAGNOSIS — E1169 Type 2 diabetes mellitus with other specified complication: Secondary | ICD-10-CM | POA: Diagnosis not present

## 2023-12-21 NOTE — Telephone Encounter (Signed)
 Ok for orders can they take verbal?

## 2023-12-21 NOTE — Patient Outreach (Signed)
 LCSW spoke with patient and patient's wife -[name] at scheduled visit. LCSW explained the reason for the referral . They explained that they have a personal care assistant through [facility]. They explained that they have PT and OT services. They are requesting DME equipment such as an electric wheel chair, electric lift and a transfer board.  LCSW consulted with RN Juana to discuss options for patient. It was discussed that patient should follow up with the PT agency about the DME equipment and it appeared that the transfer board had been discussed.  LCSW called patient and [name] back and LCSW explained that they should follow up with PT agency. They informed LCSW that patient had an appointment today at 11:00 am with PT.   LCSW spoke with [name] about care giver stress. She explained that she can become stress at times by th number of phone calls received and the number of people involved it his care however she is fine. [name] explained that patient has 3 different social workers and she is very confused. It was decided that LCSW would not be joining the team at this time however LCSW set RN appointment for patient for 12/30/2023 at 1:00 PM.  Per chart review, LCSW called Jim at [facility] at [phone] to follow up about DME request however did not reach him and could not leave voice mail.  Olam Ally, MSW, LCSW [place]  [facility], [facility] Licensed Clinical Social Worker Direct Dial : [phone]

## 2023-12-21 NOTE — Telephone Encounter (Signed)
 Called Jim back and LVM on secure line in regards to this

## 2023-12-22 NOTE — Telephone Encounter (Signed)
Called and LVM. 2nd attempt.

## 2023-12-23 DIAGNOSIS — R1310 Dysphagia, unspecified: Secondary | ICD-10-CM | POA: Diagnosis not present

## 2023-12-23 DIAGNOSIS — I255 Ischemic cardiomyopathy: Secondary | ICD-10-CM | POA: Diagnosis not present

## 2023-12-23 DIAGNOSIS — Z7984 Long term (current) use of oral hypoglycemic drugs: Secondary | ICD-10-CM | POA: Diagnosis not present

## 2023-12-23 DIAGNOSIS — I700 Atherosclerosis of aorta: Secondary | ICD-10-CM | POA: Diagnosis not present

## 2023-12-23 DIAGNOSIS — D631 Anemia in chronic kidney disease: Secondary | ICD-10-CM | POA: Diagnosis not present

## 2023-12-23 DIAGNOSIS — I2510 Atherosclerotic heart disease of native coronary artery without angina pectoris: Secondary | ICD-10-CM | POA: Diagnosis not present

## 2023-12-23 DIAGNOSIS — N39498 Other specified urinary incontinence: Secondary | ICD-10-CM | POA: Diagnosis not present

## 2023-12-23 DIAGNOSIS — F02A11 Dementia in other diseases classified elsewhere, mild, with agitation: Secondary | ICD-10-CM | POA: Diagnosis not present

## 2023-12-23 DIAGNOSIS — I252 Old myocardial infarction: Secondary | ICD-10-CM | POA: Diagnosis not present

## 2023-12-23 DIAGNOSIS — I480 Paroxysmal atrial fibrillation: Secondary | ICD-10-CM | POA: Diagnosis not present

## 2023-12-23 DIAGNOSIS — I132 Hypertensive heart and chronic kidney disease with heart failure and with stage 5 chronic kidney disease, or end stage renal disease: Secondary | ICD-10-CM | POA: Diagnosis not present

## 2023-12-23 DIAGNOSIS — Z7902 Long term (current) use of antithrombotics/antiplatelets: Secondary | ICD-10-CM | POA: Diagnosis not present

## 2023-12-23 DIAGNOSIS — I509 Heart failure, unspecified: Secondary | ICD-10-CM | POA: Diagnosis not present

## 2023-12-23 DIAGNOSIS — Z8673 Personal history of transient ischemic attack (TIA), and cerebral infarction without residual deficits: Secondary | ICD-10-CM | POA: Diagnosis not present

## 2023-12-23 DIAGNOSIS — G35 Multiple sclerosis: Secondary | ICD-10-CM | POA: Diagnosis not present

## 2023-12-23 DIAGNOSIS — N186 End stage renal disease: Secondary | ICD-10-CM | POA: Diagnosis not present

## 2023-12-23 DIAGNOSIS — E1122 Type 2 diabetes mellitus with diabetic chronic kidney disease: Secondary | ICD-10-CM | POA: Diagnosis not present

## 2023-12-23 DIAGNOSIS — E7800 Pure hypercholesterolemia, unspecified: Secondary | ICD-10-CM | POA: Diagnosis not present

## 2023-12-23 DIAGNOSIS — E1169 Type 2 diabetes mellitus with other specified complication: Secondary | ICD-10-CM | POA: Diagnosis not present

## 2023-12-23 DIAGNOSIS — E1142 Type 2 diabetes mellitus with diabetic polyneuropathy: Secondary | ICD-10-CM | POA: Diagnosis not present

## 2023-12-23 DIAGNOSIS — I959 Hypotension, unspecified: Secondary | ICD-10-CM | POA: Diagnosis not present

## 2023-12-23 DIAGNOSIS — I4892 Unspecified atrial flutter: Secondary | ICD-10-CM | POA: Diagnosis not present

## 2023-12-23 DIAGNOSIS — Z992 Dependence on renal dialysis: Secondary | ICD-10-CM | POA: Diagnosis not present

## 2023-12-23 DIAGNOSIS — N401 Enlarged prostate with lower urinary tract symptoms: Secondary | ICD-10-CM | POA: Diagnosis not present

## 2023-12-26 DIAGNOSIS — N179 Acute kidney failure, unspecified: Secondary | ICD-10-CM | POA: Diagnosis not present

## 2023-12-26 DIAGNOSIS — Z992 Dependence on renal dialysis: Secondary | ICD-10-CM | POA: Diagnosis not present

## 2023-12-26 DIAGNOSIS — N186 End stage renal disease: Secondary | ICD-10-CM | POA: Diagnosis not present

## 2023-12-28 ENCOUNTER — Telehealth: Payer: Self-pay | Admitting: *Deleted

## 2023-12-28 DIAGNOSIS — D631 Anemia in chronic kidney disease: Secondary | ICD-10-CM | POA: Diagnosis not present

## 2023-12-28 DIAGNOSIS — G35 Multiple sclerosis: Secondary | ICD-10-CM | POA: Diagnosis not present

## 2023-12-28 DIAGNOSIS — Z7984 Long term (current) use of oral hypoglycemic drugs: Secondary | ICD-10-CM | POA: Diagnosis not present

## 2023-12-28 DIAGNOSIS — I2510 Atherosclerotic heart disease of native coronary artery without angina pectoris: Secondary | ICD-10-CM | POA: Diagnosis not present

## 2023-12-28 DIAGNOSIS — N39498 Other specified urinary incontinence: Secondary | ICD-10-CM | POA: Diagnosis not present

## 2023-12-28 DIAGNOSIS — E7800 Pure hypercholesterolemia, unspecified: Secondary | ICD-10-CM | POA: Diagnosis not present

## 2023-12-28 DIAGNOSIS — N186 End stage renal disease: Secondary | ICD-10-CM | POA: Diagnosis not present

## 2023-12-28 DIAGNOSIS — I4892 Unspecified atrial flutter: Secondary | ICD-10-CM | POA: Diagnosis not present

## 2023-12-28 DIAGNOSIS — Z8673 Personal history of transient ischemic attack (TIA), and cerebral infarction without residual deficits: Secondary | ICD-10-CM | POA: Diagnosis not present

## 2023-12-28 DIAGNOSIS — I509 Heart failure, unspecified: Secondary | ICD-10-CM | POA: Diagnosis not present

## 2023-12-28 DIAGNOSIS — F02A11 Dementia in other diseases classified elsewhere, mild, with agitation: Secondary | ICD-10-CM | POA: Diagnosis not present

## 2023-12-28 DIAGNOSIS — I132 Hypertensive heart and chronic kidney disease with heart failure and with stage 5 chronic kidney disease, or end stage renal disease: Secondary | ICD-10-CM | POA: Diagnosis not present

## 2023-12-28 DIAGNOSIS — I700 Atherosclerosis of aorta: Secondary | ICD-10-CM | POA: Diagnosis not present

## 2023-12-28 DIAGNOSIS — E1169 Type 2 diabetes mellitus with other specified complication: Secondary | ICD-10-CM | POA: Diagnosis not present

## 2023-12-28 DIAGNOSIS — R1310 Dysphagia, unspecified: Secondary | ICD-10-CM | POA: Diagnosis not present

## 2023-12-28 DIAGNOSIS — Z7902 Long term (current) use of antithrombotics/antiplatelets: Secondary | ICD-10-CM | POA: Diagnosis not present

## 2023-12-28 DIAGNOSIS — I255 Ischemic cardiomyopathy: Secondary | ICD-10-CM | POA: Diagnosis not present

## 2023-12-28 DIAGNOSIS — N401 Enlarged prostate with lower urinary tract symptoms: Secondary | ICD-10-CM | POA: Diagnosis not present

## 2023-12-28 DIAGNOSIS — E1142 Type 2 diabetes mellitus with diabetic polyneuropathy: Secondary | ICD-10-CM | POA: Diagnosis not present

## 2023-12-28 DIAGNOSIS — I480 Paroxysmal atrial fibrillation: Secondary | ICD-10-CM | POA: Diagnosis not present

## 2023-12-28 DIAGNOSIS — I252 Old myocardial infarction: Secondary | ICD-10-CM | POA: Diagnosis not present

## 2023-12-28 DIAGNOSIS — Z992 Dependence on renal dialysis: Secondary | ICD-10-CM | POA: Diagnosis not present

## 2023-12-28 DIAGNOSIS — I959 Hypotension, unspecified: Secondary | ICD-10-CM | POA: Diagnosis not present

## 2023-12-28 DIAGNOSIS — E1122 Type 2 diabetes mellitus with diabetic chronic kidney disease: Secondary | ICD-10-CM | POA: Diagnosis not present

## 2023-12-28 NOTE — Progress Notes (Unsigned)
 Complex Care Management Care Guide Note  12/28/2023 Name: Christian [name] MRN: [id number] DOB: [birth year]  Christian [name] is a 78 y.o. year old male who is a primary care patient of Rollene Almarie LABOR, MD and is actively engaged with the care management team. I reached out to Christian [name] by phone today to assist with re-scheduling  with the Licensed Clinical Child psychotherapist.  Follow up plan: Unsuccessful telephone outreach attempt made. A HIPAA compliant phone message was left for the patient providing contact information and requesting a return call.  Christian [name], CMA [place]  [facility], [facility] Guide Direct Dial : [phone]  Fax: [phone] Website: [email or web address]

## 2023-12-28 NOTE — Telephone Encounter (Signed)
 Called and LVM 3rd attempt left message on secure line

## 2023-12-29 NOTE — Progress Notes (Signed)
 Complex Care Management Care Guide Note  12/29/2023 Name: Christian [name] MRN: [id number] DOB: 1945-09-21  Christian [name] is a 78 y.o. year old male who is a primary care patient of Rollene Almarie LABOR, MD and is actively engaged with the care management team. I reached out to [name] by phone today to assist with re-scheduling  with the Licensed Clinical Child psychotherapist.  Follow up plan: Unsuccessful telephone outreach attempt made. A HIPAA compliant phone message was left for the patient providing contact information and requesting a return call. No further outreach attempts will be made due to inability to maintain patient contact.   Thedford Franks, CMA, Care Guide [facility], [place] Guide Direct Dial : [phone]  Fax: [phone] Website: [email or web address]

## 2023-12-30 ENCOUNTER — Other Ambulatory Visit: Payer: Self-pay

## 2023-12-30 DIAGNOSIS — D631 Anemia in chronic kidney disease: Secondary | ICD-10-CM | POA: Diagnosis not present

## 2023-12-30 DIAGNOSIS — Z7902 Long term (current) use of antithrombotics/antiplatelets: Secondary | ICD-10-CM | POA: Diagnosis not present

## 2023-12-30 DIAGNOSIS — I509 Heart failure, unspecified: Secondary | ICD-10-CM | POA: Diagnosis not present

## 2023-12-30 DIAGNOSIS — Z7984 Long term (current) use of oral hypoglycemic drugs: Secondary | ICD-10-CM | POA: Diagnosis not present

## 2023-12-30 DIAGNOSIS — N401 Enlarged prostate with lower urinary tract symptoms: Secondary | ICD-10-CM | POA: Diagnosis not present

## 2023-12-30 DIAGNOSIS — E1122 Type 2 diabetes mellitus with diabetic chronic kidney disease: Secondary | ICD-10-CM | POA: Diagnosis not present

## 2023-12-30 DIAGNOSIS — E1142 Type 2 diabetes mellitus with diabetic polyneuropathy: Secondary | ICD-10-CM | POA: Diagnosis not present

## 2023-12-30 DIAGNOSIS — E1169 Type 2 diabetes mellitus with other specified complication: Secondary | ICD-10-CM | POA: Diagnosis not present

## 2023-12-30 DIAGNOSIS — G35 Multiple sclerosis: Secondary | ICD-10-CM | POA: Diagnosis not present

## 2023-12-30 DIAGNOSIS — I700 Atherosclerosis of aorta: Secondary | ICD-10-CM | POA: Diagnosis not present

## 2023-12-30 DIAGNOSIS — I4892 Unspecified atrial flutter: Secondary | ICD-10-CM | POA: Diagnosis not present

## 2023-12-30 DIAGNOSIS — I252 Old myocardial infarction: Secondary | ICD-10-CM | POA: Diagnosis not present

## 2023-12-30 DIAGNOSIS — I2510 Atherosclerotic heart disease of native coronary artery without angina pectoris: Secondary | ICD-10-CM | POA: Diagnosis not present

## 2023-12-30 DIAGNOSIS — R1310 Dysphagia, unspecified: Secondary | ICD-10-CM | POA: Diagnosis not present

## 2023-12-30 DIAGNOSIS — E7800 Pure hypercholesterolemia, unspecified: Secondary | ICD-10-CM | POA: Diagnosis not present

## 2023-12-30 DIAGNOSIS — I959 Hypotension, unspecified: Secondary | ICD-10-CM | POA: Diagnosis not present

## 2023-12-30 DIAGNOSIS — I255 Ischemic cardiomyopathy: Secondary | ICD-10-CM | POA: Diagnosis not present

## 2023-12-30 DIAGNOSIS — Z8673 Personal history of transient ischemic attack (TIA), and cerebral infarction without residual deficits: Secondary | ICD-10-CM | POA: Diagnosis not present

## 2023-12-30 DIAGNOSIS — F02A11 Dementia in other diseases classified elsewhere, mild, with agitation: Secondary | ICD-10-CM | POA: Diagnosis not present

## 2023-12-30 DIAGNOSIS — I480 Paroxysmal atrial fibrillation: Secondary | ICD-10-CM | POA: Diagnosis not present

## 2023-12-30 DIAGNOSIS — Z992 Dependence on renal dialysis: Secondary | ICD-10-CM | POA: Diagnosis not present

## 2023-12-30 DIAGNOSIS — N186 End stage renal disease: Secondary | ICD-10-CM | POA: Diagnosis not present

## 2023-12-30 DIAGNOSIS — N39498 Other specified urinary incontinence: Secondary | ICD-10-CM | POA: Diagnosis not present

## 2023-12-30 DIAGNOSIS — I132 Hypertensive heart and chronic kidney disease with heart failure and with stage 5 chronic kidney disease, or end stage renal disease: Secondary | ICD-10-CM | POA: Diagnosis not present

## 2023-12-30 NOTE — Patient Outreach (Deleted)
 Complex Care Management   Visit Note  12/30/2023  Name:  Christian [name] MRN: [id number] DOB: 06-18-1945  Situation: Referral received for Complex Care Management related to Heart Failure and ESRD I obtained verbal consent from Patient.  Visit completed with ***  {VISIT LOCATION:32553}   There were no vitals filed for this visit.  Medications Reviewed Today   Medications were not reviewed in this encounter     Recommendation:   {RECOMMENDATONS:32554}  Follow Up Plan:   {FOLLOWUP:32559}  SIG ***

## 2023-12-30 NOTE — Patient Outreach (Signed)
 Complex Care Management   Visit Note  12/30/2023  Name:  Christian [name] MRN: [id number] DOB: 1945/09/03  Situation: Referral received for Complex Care Management related to recent discharge from SNF: Heart Failure and ESRD I obtained verbal consent from Patient.  Visit completed with Patient and Spouse  on the phone  Background:   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   Diabetic polyneuropathy    Elevated liver enzymes 08/09/2019   Hiatal hernia    History of coronary artery stent placement    History of pulmonary embolism 08/09/2019   HTN (hypertension) 11/12/2009   Hyperlipidemia associated with type 2 diabetes mellitus 11/12/2009   Left leg weakness 11/23/2018   Mild dementia, 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 LIMA TO LAD, RIMA TO PDA, RADIAL ARTERY TO CIRC AND SEQUENTIALLY TO OM1.   Sleep difficulties 05/30/2020   Type II diabetes mellitus 10/10/2014    Assessment: Patient Reported Symptoms:  Cognitive Cognitive Status: Alert and oriented to person, place, and time, No symptoms reported      Neurological Neurological Review of Symptoms: No symptoms reported    HEENT HEENT Symptoms Reported: No symptoms reported      Cardiovascular Cardiovascular Symptoms Reported: No symptoms reported    Respiratory Respiratory Symptoms Reported: No symptoms reported    Endocrine Endocrine Symptoms Reported: No symptoms reported Is patient diabetic?: Yes Is patient checking blood sugars at home?: No (diet controlled at  this time. Also on HD. Per wife, patient is no longer on medications. per review of chart patient does not need to check BS unless symptoms. BS monitored with HD) Endocrine Self-Management Outcome: 4 (good)  Gastrointestinal Gastrointestinal Symptoms Reported: No symptoms reported      Genitourinary Additional Genitourinary Details: HD three times/week Monday, Wednesday, Friday    Integumentary Integumentary Symptoms Reported: No symptoms reported    Musculoskeletal Musculoskelatal Symptoms Reviewed: Limited mobility Additional Musculoskeletal Details: discharfed from SNF rehab-[facility] on 12/02/23 Musculoskeletal Management Strategies: Exercise, Medication therapy, Routine screening, Adequate rest (active with home health PT/OT)      Psychosocial Psychosocial Symptoms Reported: No symptoms reported          There were no vitals filed for this visit.  Medications Reviewed Today     Reviewed by Dekisha Mesmer M, RN (Registered Nurse) on 12/30/23 at 1409  Med List Status: <None>   Medication Order Taking? Sig Documenting Provider Last Dose Status Informant  acetaminophen  (TYLENOL ) 325 MG tablet [id number] Yes Take 2 tablets (650 mg total) by mouth every 6 (six) hours as needed for mild pain (pain score 1-3), fever or headache. Danton Reyes DASEN, MD  Active   atorvastatin  (LIPITOR) 20 MG tablet [id number] Yes Take 1 tablet (20 mg total) by mouth every evening. Ganji, Jay, MD  Active Spouse/Significant Other, Pharmacy Records  budesonide -formoterol  (SYMBICORT ) 160-4.5 MCG/ACT inhaler [id number] Yes INHALE 2 PUFFS INTO THE LUNGS TWICE A DAY Kara Dorn NOVAK, MD  Active Spouse/Significant Other, Pharmacy Records  calcitRIOL  (ROCALTROL ) 0.25 MCG capsule [id number] Yes Take 0.25 mcg by mouth 3 (three)  times a week. Take one capsule at dinner after on Mon-Wed-Fri. [provider]  Active Spouse/Significant Other, Pharmacy Records           Med Note (LEE, [name]   Tue Aug 31, 2023  7:24  AM) Taken on Mon-Wed-Fri  calcium  acetate (PHOSLO ) 667 MG capsule [id number]  Take 1 capsule (667 mg total) by mouth 3 (three) times daily with meals.  Patient not taking: Reported on 12/30/2023   Danton Reyes DASEN, MD  Active   clopidogrel  (PLAVIX ) 75 MG tablet [id number] Yes Take 1 tablet (75 mg total) by mouth daily. Ladona Heinz, MD  Active Spouse/Significant Other, Pharmacy Records  cromolyn  (OPTICROM ) 4 % ophthalmic solution [id number] Yes Place 1 drop into both eyes every Wednesday. At bedtime [provider]  Active Spouse/Significant Other, Pharmacy Records           Med Note (LEE, [name]   Tue Aug 31, 2023  7:25 AM) Hasn't used in 3 months  cyclobenzaprine  (FLEXERIL ) 5 MG tablet [id number]  Take 1 tablet (5 mg total) by mouth 2 (two) times daily as needed for muscle spasms.  Patient not taking: Reported on 12/30/2023   Elnor Jayson LABOR, DO  Active   Darbepoetin Alfa  (ARANESP ) 100 MCG/0.5ML SOSY injection [id number]  Inject 0.5 mLs (100 mcg total) into the skin every Thursday at 6pm.  Patient not taking: Reported on 12/14/2023   Danton Reyes DASEN, MD  Active   diphenhydrAMINE  (BENADRYL ) 25 mg capsule [id number] Yes Take 1 capsule (25 mg total) by mouth every 6 (six) hours as needed for itching. Danton Reyes DASEN, MD  Active   docusate sodium  (COLACE) 100 MG capsule [id number] Yes Take 1 capsule (100 mg total) by mouth 2 (two) times daily. Paola Dreama SAILOR, MD  Active   ferrous sulfate  325 (65 FE) MG tablet [id number] Yes Take 325 mg by mouth daily with breakfast. [provider]  Active Spouse/Significant Other, Pharmacy Records           Med Note ROSABEL, RICARDO K   Wed Apr 24, 2020  2:09 PM)    midodrine  (PROAMATINE ) 10 MG tablet [id number] Yes Take 2 tablets (20 mg total) by mouth 3 (three) times daily with meals.  Patient taking differently: Take 2 tablets (20 mg total) by mouth 3 (three) times daily with meals.   Danton Reyes DASEN, MD  Active   multivitamin (RENA-VIT) TABS  tablet [id number] Yes Take 1 tablet by mouth at bedtime. Danton Reyes DASEN, MD  Active   pantoprazole  (PROTONIX ) 40 MG tablet [id number] Yes Take by mouth. [provider]  Active   senna-docusate (SENOKOT-S) 8.6-50 MG tablet [id number] Yes Take 1 tablet by mouth 2 (two) times daily. Danton Reyes DASEN, MD  Active           Recommendation:   Patient to continue HD treatment tid Patient wife to reach out to HD center regarding questions re: HD treatment Continue Current Plan of Care  Follow Up Plan:   Telephone follow up appointment date/time:  01/06/24 at 10 am  Heddy Shutter, RN, MSN, BSN, CCM [place]  [facility], Population Health Case Manager Phone: [phone]

## 2023-12-30 NOTE — Patient Instructions (Addendum)
 Visit Information  Thank you for taking time to visit with me today. Please don't hesitate to contact me if I can be of assistance to you before our next scheduled appointment.  Our next appointment is by telephone on 01/06/24 at 10:00 am Please call the care guide team at [phone] if you need to cancel or reschedule your appointment.   Following is a copy of your care plan:   Goals Addressed             This Visit's Progress    VBCI RN Care Plan       Problems:  Chronic Disease Management support and education needs related to ESRD Fall risk due to limited Mobility  Transition from [facility] to home on 11/02/23. PCP visit completed 12/15/23  Goal: Over the next 90 days the Patient will continue to work with RN Care Manager and/or Social Worker to address care management and care coordination needs related to ESRD as evidenced by adherence to care management team scheduled appointments     verbalize understanding of plan for management of ESRD as evidenced by patient/spouse report and/or review of chart  Interventions:   ESRD Interventions: Advised patient to discuss HD medications with dialysis center/ with provider Evaluated patient adherence to plan of care for management of ESRD  Falls Interventions: Advised patient of importance of notifying provider of falls Discussed fall prevention strategy Confirmed that patient is actively involved with home health PT/OT Education re: fall prevention strategies provided.  Patient Self-Care Activities:  Attend all scheduled provider appointments Call provider office for new concerns or questions  Take medications as prescribed    Plan:  An initial telephone outreach has been scheduled for: 01/06/24 at 10:00 am           Please call the Suicide and Crisis Lifeline: 988 call the [place]  National Suicide Prevention Lifeline: 680-653-8294 or TTY: 425-425-8413 TTY (407)227-5072) to talk to a trained counselor call  1-800-273-TALK (toll free, 24 hour hotline) if you are experiencing a Mental Health or Behavioral Health Crisis or need someone to talk to.  Patient verbalizes understanding of instructions and care plan provided today and agrees to view in MyChart. Active MyChart status and patient understanding of how to access instructions and care plan via MyChart confirmed with patient.     Heddy Shutter, RN, MSN, BSN, CCM [place]  [facility], Population Health Case Manager Phone: [phone]

## 2023-12-31 ENCOUNTER — Telehealth: Payer: Self-pay | Admitting: Radiology

## 2023-12-31 NOTE — Telephone Encounter (Signed)
 Patient wife want to know if he should still be taking this and if so could you take over this medication and refill it for him as he has two pills left and will be out over the weekend but I have also explained to her that unfortunately he may run out because we also still need to do a prior authorization but hopefully we should get a response back within 24-48 hours depending on insurance

## 2023-12-31 NOTE — Telephone Encounter (Signed)
 Copied from CRM [id number]. Topic: Clinical - Medication Prior Auth >> Dec 31, 2023 11:32 AM Pinkey ORN wrote: Reason for CRM: pantoprazole  (PROTONIX ) 40 MG tablet >> Dec 31, 2023 11:34 AM Pinkey ORN wrote: Patient is needing a prior authorization in order to refill, but also wants to know if Rollene Almarie LABOR, MD wants to keep patient on this medication. Please follow up at [phone]

## 2023-12-31 NOTE — Telephone Encounter (Signed)
 It doesn't look like I have prescribed it is listed as historical.

## 2023-12-31 NOTE — Telephone Encounter (Signed)
 Please advise before I start PA

## 2024-01-03 NOTE — Telephone Encounter (Signed)
 I don't know that he needs to continue on this medicine. Ok to stop when they run out.

## 2024-01-03 NOTE — Telephone Encounter (Signed)
 Called patient wife back and informed her that this medication will no longer need to be taken. Patient wife verbalized she understood

## 2024-01-04 ENCOUNTER — Telehealth

## 2024-01-06 ENCOUNTER — Emergency Department (HOSPITAL_COMMUNITY)

## 2024-01-06 ENCOUNTER — Other Ambulatory Visit: Payer: Self-pay

## 2024-01-06 ENCOUNTER — Ambulatory Visit: Payer: Self-pay

## 2024-01-06 ENCOUNTER — Observation Stay (HOSPITAL_COMMUNITY)
Admission: EM | Admit: 2024-01-06 | Discharge: 2024-01-07 | Disposition: A | Discharge status: Home / Self Care | Attending: Family Medicine | Admitting: Family Medicine

## 2024-01-06 DIAGNOSIS — E785 Hyperlipidemia, unspecified: Secondary | ICD-10-CM | POA: Insufficient documentation

## 2024-01-06 DIAGNOSIS — R079 Chest pain, unspecified: Secondary | ICD-10-CM | POA: Diagnosis not present

## 2024-01-06 DIAGNOSIS — Z743 Need for continuous supervision: Secondary | ICD-10-CM | POA: Diagnosis not present

## 2024-01-06 DIAGNOSIS — I10 Essential (primary) hypertension: Secondary | ICD-10-CM | POA: Diagnosis present

## 2024-01-06 DIAGNOSIS — I2510 Atherosclerotic heart disease of native coronary artery without angina pectoris: Principal | ICD-10-CM | POA: Diagnosis present

## 2024-01-06 DIAGNOSIS — J90 Pleural effusion, not elsewhere classified: Secondary | ICD-10-CM | POA: Diagnosis not present

## 2024-01-06 DIAGNOSIS — R0602 Shortness of breath: Secondary | ICD-10-CM | POA: Diagnosis not present

## 2024-01-06 DIAGNOSIS — F03A0 Unspecified dementia, mild, without behavioral disturbance, psychotic disturbance, mood disturbance, and anxiety: Secondary | ICD-10-CM | POA: Diagnosis not present

## 2024-01-06 DIAGNOSIS — E1122 Type 2 diabetes mellitus with diabetic chronic kidney disease: Secondary | ICD-10-CM | POA: Diagnosis not present

## 2024-01-06 DIAGNOSIS — Z86711 Personal history of pulmonary embolism: Secondary | ICD-10-CM | POA: Diagnosis not present

## 2024-01-06 DIAGNOSIS — G35 Multiple sclerosis: Secondary | ICD-10-CM | POA: Diagnosis not present

## 2024-01-06 DIAGNOSIS — I132 Hypertensive heart and chronic kidney disease with heart failure and with stage 5 chronic kidney disease, or end stage renal disease: Secondary | ICD-10-CM | POA: Diagnosis not present

## 2024-01-06 DIAGNOSIS — Z959 Presence of cardiac and vascular implant and graft, unspecified: Secondary | ICD-10-CM | POA: Insufficient documentation

## 2024-01-06 DIAGNOSIS — R0789 Other chest pain: Secondary | ICD-10-CM | POA: Diagnosis not present

## 2024-01-06 DIAGNOSIS — N186 End stage renal disease: Secondary | ICD-10-CM | POA: Diagnosis not present

## 2024-01-06 DIAGNOSIS — F0390 Unspecified dementia without behavioral disturbance: Secondary | ICD-10-CM | POA: Insufficient documentation

## 2024-01-06 DIAGNOSIS — E1169 Type 2 diabetes mellitus with other specified complication: Secondary | ICD-10-CM | POA: Diagnosis not present

## 2024-01-06 DIAGNOSIS — Z951 Presence of aortocoronary bypass graft: Secondary | ICD-10-CM

## 2024-01-06 DIAGNOSIS — I509 Heart failure, unspecified: Secondary | ICD-10-CM | POA: Diagnosis not present

## 2024-01-06 DIAGNOSIS — Z992 Dependence on renal dialysis: Secondary | ICD-10-CM | POA: Diagnosis not present

## 2024-01-06 DIAGNOSIS — J929 Pleural plaque without asbestos: Secondary | ICD-10-CM | POA: Diagnosis not present

## 2024-01-06 DIAGNOSIS — I480 Paroxysmal atrial fibrillation: Secondary | ICD-10-CM | POA: Insufficient documentation

## 2024-01-06 DIAGNOSIS — N400 Enlarged prostate without lower urinary tract symptoms: Secondary | ICD-10-CM | POA: Diagnosis not present

## 2024-01-06 DIAGNOSIS — N189 Chronic kidney disease, unspecified: Secondary | ICD-10-CM | POA: Diagnosis present

## 2024-01-06 DIAGNOSIS — Z7982 Long term (current) use of aspirin: Secondary | ICD-10-CM | POA: Insufficient documentation

## 2024-01-06 DIAGNOSIS — N281 Cyst of kidney, acquired: Secondary | ICD-10-CM | POA: Diagnosis not present

## 2024-01-06 DIAGNOSIS — D631 Anemia in chronic kidney disease: Secondary | ICD-10-CM | POA: Diagnosis present

## 2024-01-06 DIAGNOSIS — Z955 Presence of coronary angioplasty implant and graft: Secondary | ICD-10-CM

## 2024-01-06 DIAGNOSIS — R000 Tachycardia, unspecified: Secondary | ICD-10-CM | POA: Diagnosis not present

## 2024-01-06 DIAGNOSIS — I4892 Unspecified atrial flutter: Secondary | ICD-10-CM | POA: Diagnosis not present

## 2024-01-06 DIAGNOSIS — J9811 Atelectasis: Secondary | ICD-10-CM | POA: Diagnosis not present

## 2024-01-06 DIAGNOSIS — E119 Type 2 diabetes mellitus without complications: Secondary | ICD-10-CM

## 2024-01-06 DIAGNOSIS — R918 Other nonspecific abnormal finding of lung field: Secondary | ICD-10-CM | POA: Diagnosis not present

## 2024-01-06 LAB — CBC WITH DIFFERENTIAL/PLATELET
Abs Immature Granulocytes: 0.03 K/uL (ref 0.00–0.07)
Basophils Absolute: 0.1 K/uL (ref 0.0–0.1)
Basophils Relative: 1 %
Eosinophils Absolute: 0.1 K/uL (ref 0.0–0.5)
Eosinophils Relative: 1 %
HCT: 37.5 % — ABNORMAL LOW (ref 39.0–52.0)
Hemoglobin: 11.7 g/dL — ABNORMAL LOW (ref 13.0–17.0)
Immature Granulocytes: 0 %
Lymphocytes Relative: 26 %
Lymphs Abs: 2 K/uL (ref 0.7–4.0)
MCH: 32 pg (ref 26.0–34.0)
MCHC: 31.2 g/dL (ref 30.0–36.0)
MCV: 102.5 fL — ABNORMAL HIGH (ref 80.0–100.0)
Monocytes Absolute: 1.1 K/uL — ABNORMAL HIGH (ref 0.1–1.0)
Monocytes Relative: 14 %
Neutro Abs: 4.5 K/uL (ref 1.7–7.7)
Neutrophils Relative %: 58 %
Platelets: 325 K/uL (ref 150–400)
RBC: 3.66 MIL/uL — ABNORMAL LOW (ref 4.22–5.81)
RDW: 15 % (ref 11.5–15.5)
WBC: 7.8 K/uL (ref 4.0–10.5)
nRBC: 0 % (ref 0.0–0.2)

## 2024-01-06 LAB — COMPREHENSIVE METABOLIC PANEL WITH GFR
ALT: 19 U/L (ref 0–44)
AST: 41 U/L (ref 15–41)
Albumin: 2.5 g/dL — ABNORMAL LOW (ref 3.5–5.0)
Alkaline Phosphatase: 130 U/L — ABNORMAL HIGH (ref 38–126)
Anion gap: 13 (ref 5–15)
BUN: <5 mg/dL — ABNORMAL LOW (ref 8–23)
CO2: 28 mmol/L (ref 22–32)
Calcium: 9.1 mg/dL (ref 8.9–10.3)
Chloride: 95 mmol/L — ABNORMAL LOW (ref 98–111)
Creatinine, Ser: 2.76 mg/dL — ABNORMAL HIGH (ref 0.61–1.24)
GFR, Estimated: 23 mL/min — ABNORMAL LOW (ref >60)
Glucose, Bld: 83 mg/dL (ref 70–99)
Potassium: 3 mmol/L — ABNORMAL LOW (ref 3.5–5.1)
Sodium: 136 mmol/L (ref 135–145)
Total Bilirubin: 0.7 mg/dL (ref 0.0–1.2)
Total Protein: 6.6 g/dL (ref 6.5–8.1)

## 2024-01-06 LAB — TROPONIN I (HIGH SENSITIVITY)
Troponin I (High Sensitivity): 37 ng/L — ABNORMAL HIGH (ref <18)
Troponin I (High Sensitivity): 32 ng/L — ABNORMAL HIGH (ref <18)

## 2024-01-06 LAB — CBG MONITORING, ED: Glucose-Capillary: 77 mg/dL (ref 70–99)

## 2024-01-06 MED ORDER — ONDANSETRON HCL 4 MG/2ML IJ SOLN: 4.0000 mg | Freq: Four times a day (QID) | INTRAMUSCULAR | Status: DC | PRN

## 2024-01-06 MED ORDER — METOLAZONE 5 MG PO TABS
5.0000 mg | ORAL_TABLET | ORAL | Status: DC
  Administered 2024-01-07: 5 mg via ORAL
  Filled 2024-01-06: qty 1

## 2024-01-06 MED ORDER — INSULIN ASPART 100 UNIT/ML IJ SOLN: 0.0000 [IU] | Freq: Three times a day (TID) | INTRAMUSCULAR | Status: DC

## 2024-01-06 MED ORDER — MIDODRINE HCL 5 MG PO TABS
20.0000 mg | ORAL_TABLET | Freq: Three times a day (TID) | ORAL | Status: DC
  Administered 2024-01-07 (×2): 20 mg via ORAL
  Filled 2024-01-06: qty 4

## 2024-01-06 MED ORDER — NEPRO/CARBSTEADY PO LIQD
237.0000 mL | Freq: Two times a day (BID) | ORAL | Status: DC
Start: 2024-01-07 — End: 2024-01-07
  Administered 2024-01-07: 237 mL via ORAL

## 2024-01-06 MED ORDER — ACETAMINOPHEN 325 MG PO TABS
650.0000 mg | ORAL_TABLET | ORAL | Status: DC | PRN
  Administered 2024-01-07 (×2): 650 mg via ORAL
  Filled 2024-01-06 (×2): qty 2

## 2024-01-06 MED ORDER — IOHEXOL 350 MG/ML SOLN
60.0000 mL | Freq: Once | INTRAVENOUS | Status: AC | PRN
  Administered 2024-01-06: 60 mL via INTRAVENOUS

## 2024-01-06 MED ORDER — CLOPIDOGREL BISULFATE 75 MG PO TABS
75.0000 mg | ORAL_TABLET | Freq: Every day | ORAL | Status: DC
  Administered 2024-01-07: 75 mg via ORAL
  Filled 2024-01-06: qty 1

## 2024-01-06 MED ORDER — CHLORHEXIDINE GLUCONATE CLOTH 2 % EX PADS
6.0000 | MEDICATED_PAD | Freq: Every day | CUTANEOUS | Status: DC
  Administered 2024-01-07: 6 via TOPICAL

## 2024-01-06 MED ORDER — HEPARIN SODIUM (PORCINE) 5000 UNIT/ML IJ SOLN
5000.0000 [IU] | Freq: Three times a day (TID) | INTRAMUSCULAR | Status: DC
  Administered 2024-01-07: 5000 [IU] via SUBCUTANEOUS
  Filled 2024-01-06 (×2): qty 1

## 2024-01-06 MED ORDER — CROMOLYN SODIUM 4 % OP SOLN: 1.0000 [drp] | OPHTHALMIC | Status: DC

## 2024-01-06 MED ORDER — ISOSORBIDE DINITRATE 10 MG PO TABS
30.0000 mg | ORAL_TABLET | Freq: Three times a day (TID) | ORAL | Status: DC
  Administered 2024-01-07 (×2): 30 mg via ORAL
  Filled 2024-01-06 (×2): qty 3

## 2024-01-06 MED ORDER — ATORVASTATIN CALCIUM 10 MG PO TABS
20.0000 mg | ORAL_TABLET | Freq: Every evening | ORAL | Status: DC
  Administered 2024-01-06: 20 mg via ORAL
  Filled 2024-01-06: qty 2

## 2024-01-06 MED ORDER — ASPIRIN 81 MG PO CHEW
324.0000 mg | CHEWABLE_TABLET | Freq: Once | ORAL | Status: AC
  Administered 2024-01-06: 324 mg via ORAL
  Filled 2024-01-06: qty 4

## 2024-01-06 MED ORDER — FENTANYL CITRATE PF 50 MCG/ML IJ SOSY
50.0000 ug | PREFILLED_SYRINGE | Freq: Once | INTRAMUSCULAR | Status: AC
  Administered 2024-01-06: 50 ug via INTRAVENOUS
  Filled 2024-01-06: qty 1

## 2024-01-06 NOTE — ED Provider Notes (Signed)
 [place] EMERGENCY DEPARTMENT AT [facility] Provider Note   CSN: [id number] Arrival date & time: 01/06/24  1132     Patient presents with: Chest Pain   Christian [name] is a 78 y.o. male.   HPI 78 year old male presents with chest pain.  Has a history of PE, CAD, CHF, hypertension, ESRD, CABG.  Chest pain started around 2 in the morning.  Is severe and sharp at times.  Hurts worse with deep breathing.  No abdominal pain or back pain today.  Pain starts on the left side of his chest and goes towards the right.  He last had dialysis yesterday.  No significant cough or leg swelling.  Prior to Admission medications   Medication Sig Start Date End Date Taking? Authorizing Provider  acetaminophen  (TYLENOL ) 325 MG tablet Take 2 tablets (650 mg total) by mouth every 6 (six) hours as needed for mild pain (pain score 1-3), fever or headache. 10/01/23  Yes Danton Reyes DASEN, MD  atorvastatin  (LIPITOR) 20 MG tablet Take 1 tablet (20 mg total) by mouth every evening. 08/24/23  Yes Ladona Heinz, MD  budesonide -formoterol  (SYMBICORT ) 160-4.5 MCG/ACT inhaler INHALE 2 PUFFS INTO THE LUNGS TWICE A DAY 12/09/22  Yes Kara Dorn NOVAK, MD  calcitRIOL (ROCALTROL) 0.25 MCG capsule Take 0.25 mcg by mouth 3 (three) times a week. Take one capsule at dinner after on Mon-Wed-Fri. 07/21/23  Yes [provider]  clopidogrel  (PLAVIX ) 75 MG tablet Take 1 tablet (75 mg total) by mouth daily. 06/24/23  Yes Ladona Heinz, MD  cromolyn  (OPTICROM ) 4 % ophthalmic solution Place 1 drop into both eyes every Wednesday. At bedtime 12/08/19  Yes [provider]  diphenhydrAMINE  (BENADRYL ) 25 mg capsule Take 1 capsule (25 mg total) by mouth every 6 (six) hours as needed for itching. 10/01/23  Yes Danton Reyes DASEN, MD  docusate sodium  (COLACE) 100 MG capsule Take 1 capsule (100 mg total) by mouth 2 (two) times daily. 08/31/23 08/30/24 Yes Lovick, Dreama SAILOR, MD  ferrous sulfate  325 (65 FE) MG tablet Take 325 mg by  mouth daily with breakfast.   Yes [provider]  isosorbide  dinitrate (ISORDIL ) 30 MG tablet Take 30 mg by mouth 3 (three) times daily. 11/13/23  Yes [provider]  metolazone  (ZAROXOLYN ) 5 MG tablet Take 5 mg by mouth 3 (three) times a week. 11/13/23  Yes [provider]  midodrine  (PROAMATINE ) 10 MG tablet Take 2 tablets (20 mg total) by mouth 3 (three) times daily with meals. 10/01/23  Yes Danton Reyes DASEN, MD  multivitamin (RENA-VIT) TABS tablet Take 1 tablet by mouth at bedtime. 10/01/23  Yes Danton Reyes DASEN, MD  NOVOLOG  FLEXPEN 100 UNIT/ML FlexPen Inject into the skin as directed. PRN   Yes [provider]  potassium chloride  (KLOR-CON ) 10 MEQ tablet Take 10 mEq by mouth 3 (three) times daily. 10/06/23  Yes [provider]  senna-docusate (SENOKOT-S) 8.6-50 MG tablet Take 1 tablet by mouth 2 (two) times daily. 10/01/23  Yes Danton Reyes DASEN, MD  calcium  acetate (PHOSLO ) 667 MG capsule Take 1 capsule (667 mg total) by mouth 3 (three) times daily with meals. Patient not taking: Reported on 01/06/2024 10/01/23   Danton Reyes DASEN, MD  oxyCODONE  (OXY IR/ROXICODONE ) 5 MG immediate release tablet Take 5 mg by mouth every 4 (four) hours as needed for moderate pain (pain score 4-6). Patient not taking: Reported on 01/06/2024 10/18/23   [provider]  pantoprazole  (PROTONIX ) 40 MG tablet Take 40 mg  by mouth daily. Patient not taking: Reported on 01/06/2024 12/10/23   [provider]    Allergies: Patient has no known allergies.    Review of Systems  Respiratory:  Positive for shortness of breath. Negative for cough.   Cardiovascular:  Positive for chest pain. Negative for leg swelling.  Gastrointestinal:  Negative for abdominal pain.  Musculoskeletal:  Negative for back pain.    Updated Vital Signs BP 127/83   Pulse (!) 107   Temp 97.6 F (36.4 C) (Oral)   Resp 16   Ht 5' 9 (1.753 m)   SpO2 100%   BMI 25.39 kg/m   Physical  Exam Vitals and nursing note reviewed.  Constitutional:      Appearance: He is well-developed.  HENT:     Head: Normocephalic and atraumatic.  Cardiovascular:     Rate and Rhythm: Regular rhythm. Tachycardia present.     Heart sounds: Normal heart sounds.  Pulmonary:     Effort: Pulmonary effort is normal.     Breath sounds: Normal breath sounds.  Chest:     Chest wall: No tenderness.  Abdominal:     Palpations: Abdomen is soft.     Tenderness: There is no abdominal tenderness.  Musculoskeletal:     Right lower leg: No edema.     Left lower leg: No edema.  Skin:    General: Skin is warm and dry.  Neurological:     Mental Status: He is alert.     (all labs ordered are listed, but only abnormal results are displayed) Labs Reviewed  COMPREHENSIVE METABOLIC PANEL WITH GFR - Abnormal; Notable for the following components:      Result Value   Potassium 3.0 (*)    Chloride 95 (*)    BUN <5 (*)    Creatinine, Ser 2.76 (*)    Albumin  2.5 (*)    Alkaline Phosphatase 130 (*)    GFR, Estimated 23 (*)    All other components within normal limits  CBC WITH DIFFERENTIAL/PLATELET - Abnormal; Notable for the following components:   RBC 3.66 (*)    Hemoglobin 11.7 (*)    HCT 37.5 (*)    MCV 102.5 (*)    Monocytes Absolute 1.1 (*)    All other components within normal limits  TROPONIN I (HIGH SENSITIVITY) - Abnormal; Notable for the following components:   Troponin I (High Sensitivity) 37 (*)    All other components within normal limits  TROPONIN I (HIGH SENSITIVITY) - Abnormal; Notable for the following components:   Troponin I (High Sensitivity) 32 (*)    All other components within normal limits    EKG: EKG Interpretation Date/Time:  Thursday January 06 2024 11:41:03 EDT Ventricular Rate:  121 PR Interval:  135 QRS Duration:  89 QT Interval:  308 QTC Calculation: 437 R Axis:   21  Text Interpretation: Sinus tachycardia with irregular rate Left atrial enlargement  Abnormal T, consider ischemia, anterior leads Confirmed by Freddi Hamilton [phone]) on 01/06/2024 11:52:06 AM  Radiology: [facility] Chest Portable 1 View Result Date: 01/06/2024 CLINICAL DATA:  Acute chest pain. EXAM: PORTABLE CHEST 1 VIEW COMPARISON:  Sep 23, 2023 FINDINGS: There is stable right-sided venous catheter positioning. Radiopaque sternal fixation plates are seen. The heart size and mediastinal contours are within normal limits. A stable 6 mm nodular opacity is seen overlying the upper right. Mild atelectasis and/or scarring is seen within the left lung base. There is a small left pleural effusion. No pneumothorax is identified.  No acute osseous abnormality is identified. IMPRESSION: 1. Mild left basilar atelectasis and/or scarring. 2. Small left pleural effusion. Electronically Signed   By: Suzen Dials M.D.   On: 01/06/2024 13:07     Procedures   Medications Ordered in the ED  fentaNYL  (SUBLIMAZE ) injection 50 mcg (50 mcg Intravenous Given 01/06/24 1230)  aspirin  chewable tablet 324 mg (324 mg Oral Given 01/06/24 1231)                                    Medical Decision Making Amount and/or Complexity of Data Reviewed Labs: ordered.    Details: Mildly elevated troponins, flat Radiology: ordered and independent interpretation performed.    Details: No pneumothorax ECG/medicine tests: ordered and independent interpretation performed.    Details: Sinus tachycardia  Risk OTC drugs. Prescription drug management.   Patient presents with chest pain.  Went away with some fentanyl .  A little atypical though he does have significant risk factors with CAD and ESRD.  Will need workup for PE given the tachycardia and pleuritic pain.  CTA is currently pending.  Care transferred to Dr. Franklyn.     Final diagnoses:  None    ED Discharge Orders     None          Freddi Hamilton, MD 01/06/24 1524

## 2024-01-06 NOTE — ED Triage Notes (Signed)
 Patient via EMS from home for eval of chest pain that has now resolved but was present between 0300-0400. L chest, sharp, non-radiating, with associated SOB, though patient reports some mild baseline SOB. MWF dialysis patient, full tx yesterday.

## 2024-01-06 NOTE — Consult Note (Addendum)
 Cardiology Consultation   Patient ID: Christian [name] MRN: [id number]; DOB: [birth year]  Admit date: 01/06/2024 Date of Consult: 01/06/2024  PCP:  Rollene Almarie LABOR, MD   [facility] Providers Cardiologist:  Gordy Bergamo, MD        Patient Profile: Christian [name] is a 78 y.o. male with a hx of CABG who is being seen 01/06/2024 for the evaluation of chest discomfort at the request of hospitalist service.  History of Present Illness: Christian [name] has had prior CABG and PCI for CABG, atrial flutter, DM, as well as ESRD on dialysis, multiple sclerosis, prior PE and anemia.  Currently pain free, but has had pain intermittently over the day that is pleuritic in nature, occurred during dialysis.  In ED, ECG notable for tachycardia, but CTA negative for PE.  Normotensive ECG with non-specific ST-TW changes Troponin levels without change from 37 to 32. Dialyzed earlier today.  Albumin  is 2.5  Last echo in May 2025 with significant LVH and globally preserved EF, aortic valve calcification without stenosis.  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   Diabetic polyneuropathy    Elevated liver enzymes 08/09/2019   Hiatal hernia    History of coronary artery stent placement    History of pulmonary embolism 08/09/2019   HTN (hypertension) 11/12/2009   Hyperlipidemia associated with type 2 diabetes mellitus 11/12/2009   Left leg weakness 11/23/2018   Mild dementia, 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 LIMA TO LAD, RIMA TO  PDA, RADIAL ARTERY TO CIRC AND SEQUENTIALLY TO OM1.   Sleep difficulties 05/30/2020   Type II diabetes mellitus 10/10/2014    Past Surgical History:  Procedure Laterality Date   A/V FISTULAGRAM Left 08/12/2022   Procedure: A/V Fistulagram;  Surgeon: Lanis Fonda BRAVO, MD;  Location: [facility] INVASIVE CV LAB;  Service: Cardiovascular;  Laterality: Left;   A/V FISTULAGRAM N/A 09/07/2023   Procedure: A/V Fistulagram;  Surgeon: Serene Gaile ORN, MD;  Location: [facility] INVASIVE CV LAB;  Service: Cardiovascular;  Laterality: N/A;   APPLICATION OF WOUND VAC N/A 08/24/2019   Procedure: APPLICATION OF WOUND VAC;  Surgeon: German Bartlett PEDLAR, MD;  Location: [facility] OR;  Service: Thoracic;  Laterality: N/A;   APPLICATION OF WOUND VAC  08/29/2019   Procedure: Wound Vac change;  Surgeon: German Bartlett PEDLAR, MD;  Location: [facility] OR;  Service: Open Heart Surgery;;   APPLICATION OF WOUND VAC N/A 09/04/2019   Procedure: Application Of Wound Vac;  Surgeon: Army Dallas NOVAK, MD;  Location: [facility] OR;  Service: Open Heart Surgery;  Laterality: N/A;   APPLICATION OF WOUND VAC N/A 09/06/2019   Procedure: APPLICATION OF ACELL, APPLICATION OF WOUND VAC USING PREVENA INCISIONAL  DRESSING;  Surgeon: Lowery Estefana GORMAN, DO;  Location: [facility] OR;  Service: Plastics;  Laterality: N/A;   AV FISTULA PLACEMENT Left 09/18/2020   Procedure: ARTERIOVENOUS (AV) FISTULA CREATION LEFT VERSUS GRAFT;  Surgeon: Serene Gaile ORN, MD;  Location: [facility] OR;  Service: Vascular;  Laterality: Left;   BASCILIC VEIN TRANSPOSITION Left 10/31/2020   Procedure:  LEFT SECOND STAGE BASCILIC VEIN TRANSPOSITION;  Surgeon: Serene Gaile ORN, MD;  Location: [facility] OR;  Service: Vascular;  Laterality: Left;   CARDIAC CATHETERIZATION     CORONARY ARTERY BYPASS GRAFT N/A 08/15/2019   Procedure: CORONARY ARTERY BYPASS GRAFTING (CABG), x 4 using bilateral IMAs and left radial artery .  LIMA TO LAD, RIMA TO PDA, RADIAL ARTERY TO CIRC AND SEQUENTIALLY TO OM1.;  Surgeon: German Bartlett PEDLAR, MD;   Location: [facility] OR;  Service: Open Heart Surgery;  Laterality: N/A;   CORONARY STENT PLACEMENT  02/27/2014   distal rt/pd coronary       dr ladona   CYSTO/ BLADDER BIOPSY'S/ CAUTHERIZATION  01-14-2004  DR CHALES   EXPLORATION POST OPERATIVE OPEN HEART N/A 08/16/2019   Procedure: Chest Closure S?P CABG WITH APPLICATION OF PREVENA  INCISIONAL WOUND VAC;  Surgeon: German Bartlett PEDLAR, MD;  Location: [facility];  Service: Open Heart Surgery;  Laterality: N/A;   EXPLORATION POST OPERATIVE OPEN HEART N/A 08/21/2019   Procedure: CHEST WASHOUT S/P OPEN CHEST;  Surgeon: German Bartlett PEDLAR, MD;  Location: [facility] OR;  Service: Open Heart Surgery;  Laterality: N/A;  Open chest with Esmark dressing with Ioban sealant coverage.   EXPLORATION POST OPERATIVE OPEN HEART N/A 08/18/2019   Procedure: EXPLORATION POST OPERATIVE OPEN HEART (performed 04/23 on unit);  Surgeon: German Bartlett PEDLAR, MD;  Location: [facility] OR;  Service: Open Heart Surgery;  Laterality: N/A;   EXPLORATION POST OPERATIVE OPEN HEART N/A 08/24/2019   Procedure: CHEST WASHOUT POST OPERATIVE OPEN HEART;  Surgeon: German Bartlett PEDLAR, MD;  [facility]: [facility];  Service: Open Heart Surgery;  Laterality: N/A;   EXPLORATION POST OPERATIVE OPEN HEART N/A 08/29/2019   Procedure: CHEST WOUND WASHOUT POST OPERATIVE OPEN HEART;  Surgeon: German Bartlett PEDLAR, MD;  [facility];  Service: Open Heart Surgery;  Laterality: N/A;   EXPLORATION POST OPERATIVE OPEN HEART N/A 09/04/2019   Procedure: MEDIASTINAL EXPLORATION WITH STERNAL WOUND IRRIGATION;  Surgeon: Army Dallas NOVAK, MD;  Location: [facility] OR;  Service: Open Heart Surgery;  Laterality: N/A;   EXPLORATION POST OPERATIVE OPEN HEART N/A 09/14/2019   Procedure: EVACUATION OF HEMATOMA;  Surgeon: Army Dallas NOVAK, MD;  Location: [facility] OR;  Service: Open Heart Surgery;  Laterality: N/A;   INSERTION OF DIALYSIS CATHETER N/A 09/16/2023   Procedure: INSERTION OF DIALYSIS CATHETER;  Surgeon: Gretta Lonni PARAS, MD;  [facility]: [facility];  Service:  Vascular;  Laterality: N/A;  TUNNELED DIALYSIS CATHETER   IR FLUORO GUIDE CV LINE LEFT  09/19/2019   IR FLUORO GUIDE CV LINE RIGHT  09/08/2023   IR GASTROSTOMY TUBE MOD SED  10/04/2019   IR GASTROSTOMY TUBE REMOVAL  11/29/2019   IR PATIENT EVAL TECH 0-60 MINS  09/29/2019   IR US  GUIDE VASC ACCESS LEFT  09/19/2019   IR US  GUIDE VASC ACCESS RIGHT  09/08/2023   LAPAROSCOPIC APPENDECTOMY N/A 08/31/2023   Procedure: APPENDECTOMY, LAPAROSCOPIC;  Surgeon: Paola Dreama SAILOR, MD;  [facility]: [facility];  Service: General;  Laterality: N/A;   LAPAROSCOPIC LYSIS OF ADHESIONS N/A 09/06/2019   Procedure: LAPAROSCOPIC OMENTAL HARVEST;  Surgeon: Stevie Herlene Righter, MD;  Location: [facility];  Service: General;  Laterality: N/A;   LEFT HEART CATH AND CORONARY ANGIOGRAPHY N/A 08/10/2019   Procedure: LEFT HEART CATH AND CORONARY ANGIOGRAPHY;  Surgeon: Elmira Newman PARAS, MD;  [facility]: [facility] INVASIVE CV LAB;  Service: Cardiovascular;  Laterality: N/A;   LEFT HEART CATHETERIZATION WITH CORONARY ANGIOGRAM N/A 02/27/2014   Procedure: LEFT HEART CATHETERIZATION  WITH CORONARY ANGIOGRAM;  Surgeon: Erick JONELLE Bergamo, MD;  Location: [facility] CATH LAB;  Service: Cardiovascular;  Laterality: N/A;   MEDIASTINAL EXPLORATION N/A 09/06/2019   Procedure: MEDIASTINAL EXPLORATION;  Surgeon: Army Dallas NOVAK, MD;  Location: [facility] OR;  Service: Thoracic;  Laterality: N/A;   PECTORALIS FLAP  09/06/2019   Procedure: Pectoralis ADVANCEMENT Flap;  Surgeon: Lowery Estefana RAMAN, DO;  Location: [facility] OR;  Service: Plastics;;   PERCUTANEOUS CORONARY STENT INTERVENTION (PCI-S)  02/27/2014   Procedure: PERCUTANEOUS CORONARY STENT INTERVENTION (PCI-S);  Surgeon: Erick JONELLE Bergamo, MD;  Location: [facility] CATH LAB;  Service: Cardiovascular;;  rt PDA  3.0/28mm Promus stent   PERIPHERAL VASCULAR INTERVENTION Left 08/12/2022   Procedure: PERIPHERAL VASCULAR INTERVENTION;  Surgeon: Lanis Fonda BRAVO, MD;  Location: [facility] INVASIVE CV LAB;  Service: Cardiovascular;  Laterality: Left;   RADIAL  ARTERY HARVEST Left 08/15/2019   Procedure: Radial Artery Harvest;  Surgeon: German Bartlett PEDLAR, MD;  Location: [facility] OR;  Service: Open Heart Surgery;  Laterality: Left;   REMOVAL OF A HERO DEVICE Right 09/16/2023   Procedure: REMOVAL OF TEMPORARY TUNNELED HEMODIALYSIS CATHETER;  Surgeon: Gretta Lonni PARAS, MD;  Location: [facility] OR;  Service: Vascular;  Laterality: Right;   RIB PLATING N/A 09/06/2019   Procedure: STERNAL PLATING;  Surgeon: Army Dallas NOVAK, MD;  Location: [facility] OR;  Service: Thoracic;  Laterality: N/A;   TEE WITHOUT CARDIOVERSION N/A 08/15/2019   Procedure: TRANSESOPHAGEAL ECHOCARDIOGRAM (TEE);  Surgeon: German Bartlett PEDLAR, MD;  Location: [facility] OR;  Service: Open Heart Surgery;  Laterality: N/A;   TRACHEOSTOMY TUBE PLACEMENT  08/29/2019   Procedure: Tracheostomy;  Surgeon: German Bartlett PEDLAR, MD;  Location: [facility] OR;  Service: Open Heart Surgery; Decannulated 6/10/20211   TRANSURETHRAL RESECTION OF PROSTATE  04/04/2012   Procedure: TRANSURETHRAL RESECTION OF THE PROSTATE WITH GYRUS INSTRUMENTS;  Surgeon: Arlena LILLETTE Gal, MD;  Location: [facility];  Service: Urology;  Laterality: N/A;   TRANSURETHRAL RESECTION OF PROSTATE N/A 09/27/2014   Procedure: TRANSURETHRAL RESECTION OF THE PROSTATE ;  Surgeon: Arlena Gal, MD;  Location: [facility] ORS;  Service: Urology;  Laterality: N/A;   TUNNELLED CATHETER EXCHANGE N/A 09/07/2023   Procedure: TUNNELLED CATHETER EXCHANGE;  Surgeon: Serene Gaile ORN, MD;  [facility]: [facility] INVASIVE CV LAB;  Service: Cardiovascular;  Laterality: N/A;   ULTRASOUND GUIDANCE FOR VASCULAR ACCESS Right 09/16/2023   Procedure: ULTRASOUND GUIDANCE, FOR VASCULAR ACCESS;  Surgeon: Gretta Lonni PARAS, MD;  Location: [facility] OR;  Service: Vascular;  Laterality: Right;   UPPER GASTROINTESTINAL ENDOSCOPY         Scheduled Meds:  atorvastatin   20 mg Oral QPM   [START ON 01/07/2024] Chlorhexidine  Gluconate Cloth  6 each Topical Q0600   [START ON 01/07/2024] clopidogrel   75 mg  Oral Daily   [START ON 01/12/2024] cromolyn   1 drop Both Eyes Q Wed   heparin   5,000 Units Subcutaneous Q8H   [START ON 01/07/2024] insulin  aspart  0-6 Units Subcutaneous TID WC   isosorbide  dinitrate  30 mg Oral TID   [START ON 01/07/2024] metolazone   5 mg Oral Once per day on Monday Wednesday Friday   [START ON 01/07/2024] midodrine   20 mg Oral TID WC   Continuous Infusions:  PRN Meds: acetaminophen , ondansetron  (ZOFRAN ) IV  Allergies:   No Known Allergies  Social History:   Social History   Socioeconomic History   Marital status: Married    Spouse name: Not on file   Number of children: 2   Years of education: 28  Highest education level: Associate degree: academic program  Occupational History   Occupation: Retired  Tobacco Use   Smoking status: Former    Current packs/day: 0.00    Average packs/day: 0.5 packs/day for 20.0 years (10.0 ttl pk-yrs)    Types: Cigarettes    Start date: 58    Quit date: 1975    Years since quitting: 50.7   Smokeless tobacco: Never  Vaping Use   Vaping status: Never Used  Substance and Sexual Activity   Alcohol use: Not Currently    Comment: very rarely - every now and then 1 drink/1 year if that   Drug use: No   Sexual activity: Not Currently  Other Topics Concern   Not on file  Social History Narrative   Right handed   Dirnks caffeine prn   Married   Lives with wife   Social Drivers of Health   Financial Resource Strain: Low Risk  (07/23/2021)   Overall Financial Resource Strain (CARDIA)    Difficulty of Paying Living Expenses: Not hard at all  Food Insecurity: No Food Insecurity (08/31/2023)   Hunger Vital Sign    Worried About Running Out of Food in the Last Year: Never true    Ran Out of Food in the Last Year: Never true  Transportation Needs: No Transportation Needs (08/31/2023)   PRAPARE - Administrator, Civil Service (Medical): No    Lack of Transportation (Non-Medical): No  Physical Activity: Inactive  (07/23/2021)   Exercise Vital Sign    Days of Exercise per Week: 0 days    Minutes of Exercise per Session: 0 min  Stress: No Stress Concern Present (07/23/2021)   [employer] of Occupational Health - Occupational Stress Questionnaire    Feeling of Stress : Not at all  Social Connections: Moderately Integrated (08/31/2023)   Social Connection and Isolation Panel    Frequency of Communication with Friends and Family: Once a week    Frequency of Social Gatherings with Friends and Family: Never    Attends Religious Services: 1 to 4 times per year    Active Member of [employer] or Organizations: Yes    Attends Banker Meetings: 1 to 4 times per year    Marital Status: Married  Catering manager Violence: Not At Risk (08/31/2023)   Humiliation, Afraid, Rape, and Kick questionnaire    Fear of Current or Ex-Partner: No    Emotionally Abused: No    Physically Abused: No    Sexually Abused: No    Family History:    Family History  Problem Relation Age of Onset   Diabetes Mother    Hypertension Mother    Diabetes Father    Diabetes Brother    Hypertension Brother    Bone cancer Brother    Diabetes Brother      ROS:  Please see the history of present illness.   All other ROS reviewed and negative.     Physical Exam/Data: Vitals:   01/06/24 1800 01/06/24 1900 01/06/24 1933 01/06/24 2000  BP: 130/83 (!) 124/94  136/89  Pulse: (!) 114 (!) 111  (!) 112  Resp: 16 15  14   Temp:   97.6 F (36.4 C)   TempSrc:   Oral   SpO2: 100% 98%  96%  Height:       No intake or output data in the 24 hours ending 01/06/24 2050    10/01/2023    7:35 PM 10/01/2023    3:43 PM 10/01/2023  3:47 AM  Last 3 Weights  Weight (lbs) 171 lb 15.3 oz 175 lb 4.3 oz 177 lb 0.5 oz  Weight (kg) 78 kg 79.5 kg 80.3 kg     Body mass index is 25.39 kg/m.  General:  Well nourished, well developed, in no acute distress HEENT: normal Neck: no JVD Vascular: No carotid bruits; Distal pulses 2+  bilaterally Cardiac:  normal S1, S2; RRR; no murmur  Lungs:  clear to auscultation bilaterally, no wheezing, rhonchi or rales  Abd: soft, nontender, no hepatomegaly  Ext: no edema Musculoskeletal:  No deformities, BUE and BLE strength normal and equal Skin: warm and dry  Neuro:  CNs 2-12 intact, no focal abnormalities noted Psych:  Normal affect    Relevant CV Studies: Last stress with adenosine in 2023 negative for ischemia.  Laboratory Data: High Sensitivity Troponin:   Recent Labs  Lab 01/06/24 1229 01/06/24 1412  TROPONINIHS 37* 32*     Chemistry Recent Labs  Lab 01/06/24 1229  NA 136  K 3.0*  CL 95*  CO2 28  GLUCOSE 83  BUN <5*  CREATININE 2.76*  CALCIUM  9.1  GFRNONAA 23*  ANIONGAP 13    Recent Labs  Lab 01/06/24 1229  PROT 6.6  ALBUMIN  2.5*  AST 41  ALT 19  ALKPHOS 130*  BILITOT 0.7   Lipids No results for input(s): CHOL, TRIG, HDL, LABVLDL, LDLCALC, CHOLHDL in the last 168 hours.  Hematology Recent Labs  Lab 01/06/24 1229  WBC 7.8  RBC 3.66*  HGB 11.7*  HCT 37.5*  MCV 102.5*  MCH 32.0  MCHC 31.2  RDW 15.0  PLT 325   Thyroid  No results for input(s): TSH, FREET4 in the last 168 hours.  BNPNo results for input(s): BNP, PROBNP in the last 168 hours.  DDimer No results for input(s): DDIMER in the last 168 hours.  Radiology/Studies:  CT Angio Chest PE W and/or Wo Contrast Result Date: 01/06/2024 CLINICAL DATA:  High probability for PE. EXAM: CT ANGIOGRAPHY CHEST WITH CONTRAST TECHNIQUE: Multidetector CT imaging of the chest was performed using the standard protocol during bolus administration of intravenous contrast. Multiplanar CT image reconstructions and MIPs were obtained to evaluate the vascular anatomy. RADIATION DOSE REDUCTION: This exam was performed according to the departmental dose-optimization program which includes automated exposure control, adjustment of the mA and/or kV according to patient size and/or use of  iterative reconstruction technique. CONTRAST:  60mL OMNIPAQUE  IOHEXOL  350 MG/ML SOLN COMPARISON:  CT of the chest 12/12/2020 FINDINGS: Cardiovascular: Satisfactory opacification of the pulmonary arteries to the segmental level. No evidence of pulmonary embolism. Normal heart size. No pericardial effusion. There are atherosclerotic calcifications of the aorta. Right-sided central venous catheter tip ends in the right atrium. Mediastinum/Nodes: No enlarged mediastinal, hilar, or axillary lymph nodes. Thyroid  gland, trachea, and esophagus demonstrate no significant findings. Lungs/Pleura: There are chronic appearing small bilateral pleural effusions, slightly decreased on the left and increased on the right. Smooth pleural thickening and calcifications are again seen. There are areas of rounded atelectasis in the bilateral lower lobes. This is unchanged in the left lower lobe and new in the right lower lobe. There stable scarring or atelectasis in the medial right upper lobe as well. Upper Abdomen: Bilateral adrenal nodularity and thickening is unchanged. There is a small right renal cysts, partially imaged. Musculoskeletal: No chest wall abnormality. No acute or significant osseous findings. Review of the MIP images confirms the above findings. IMPRESSION: 1. No evidence for pulmonary embolism. 2. Chronic small bilateral  pleural effusions, slightly decreased on the left and increased on the right. 3. Stable rounded atelectasis in the left lower lobe. 4. New rounded atelectasis in the right lower lobe. 5. Stable bilateral calcified pleural plaques. Aortic Atherosclerosis (ICD10-I70.0). Electronically Signed   By: Greig Pique M.D.   On: 01/06/2024 16:56   DG Chest Portable 1 View Result Date: 01/06/2024 CLINICAL DATA:  Acute chest pain. EXAM: PORTABLE CHEST 1 VIEW COMPARISON:  Sep 23, 2023 FINDINGS: There is stable right-sided venous catheter positioning. Radiopaque sternal fixation plates are seen. The heart size  and mediastinal contours are within normal limits. A stable 6 mm nodular opacity is seen overlying the upper right. Mild atelectasis and/or scarring is seen within the left lung base. There is a small left pleural effusion. No pneumothorax is identified. No acute osseous abnormality is identified. IMPRESSION: 1. Mild left basilar atelectasis and/or scarring. 2. Small left pleural effusion. Electronically Signed   By: Suzen Dials M.D.   On: 01/06/2024 13:07     Assessment and Plan: Atypical chest discomfort in a gentleman with prior CAD who prefers inpatient evaluation.  ECG with sinus tachycardia, sinus arrhythmia that appears to be chronic in nature (also seen on all ECGs in May 2025).  He's already kind of passed a stress test already because with his heart rate in the 120s, he's already close to the diagnostic threshold for stress testing (85% of [220 minus age]) without myocardial infarction (troponins negative for NSTEMI).  His CABG was quite complicated, with cardiogenic shock, cardiac arrest, stroke, and bleeding requiring multiple transfusions.  I don't see any caths since CABG in 2021 to evaluate graft patency.  He is also not a candidate for aggressive anticoagulation due to GI bleeding but appears to be on clopidogrel .  As such, would strongly recommend non-invasive management without cath.  From an antianginal standpoint, he is on isosorbide  30 tid.  He was supposed to be on metoprolol  based on clinic note from Dr. Ladona, but I don't see that in his med list now.  It would be good to resume that given the tachycardia.  If provocative testing is desired, you could certainly repeat the adenosine nuclear scan he underwent a few years ago.  However, given his history, it is highly unlikely it would be followed by an invasive cardiac cath unless it was grossly abnormal.        Risk Assessment/Risk Scores:              For questions or updates, please contact [facility] Please consult [email or web address] for contact info under      Signed, Andee Flatten, MD  01/06/2024 8:50 PM

## 2024-01-06 NOTE — Telephone Encounter (Signed)
 Agree

## 2024-01-06 NOTE — Patient Instructions (Signed)
 Visit Information  Thank you for taking time to visit with me today. Please don't hesitate to contact me if I can be of assistance to you before our next scheduled appointment.  I will call you to schedule your next care management appointment.  Please call the care guide team at [phone] if you need to cancel, schedule, or reschedule an appointment.   Please call the Suicide and Crisis Lifeline: 988 call the [place]  National Suicide Prevention Lifeline: (618)263-6875 or TTY: 220-601-3261 TTY (832) 378-2507) to talk to a trained counselor call 1-800-273-TALK (toll free, 24 hour hotline) if you are experiencing a Mental Health or Behavioral Health Crisis or need someone to talk to.   Heddy Shutter, RN, MSN, BSN, CCM [facility], Population Health Case Manager Phone: [phone]

## 2024-01-06 NOTE — H&P (Addendum)
 History and Physical   [name] FMW:[id number] DOB: 01/16/1946 DOA: 01/06/2024  PCP: Rollene Almarie LABOR, MD   Patient coming from: Home  Chief Complaint: Chest pain  HPI: Christian [name] is a 78 y.o. male with medical history significant of hypertension, hyperlipidemia, diabetes, ESRD on HD, CAD status post stenting and CABG x 4, paroxysmal atrial flutter, CHF, mild dementia, multiple sclerosis, PE, anemia, BPH presenting with chest pain.  Patient says he started to have significant chest pain at 2 AM.  Intermittently severe.  Described as sharp and starting on the left side radiating to the right.  Increased with deep inspiration and relieved with rest.  Patient is chest pain-free now but reports he is concerned about significant difficulty with outpatient follow-up with cardiology.  Will be more comfortable with in-hospital evaluation if possible.  Does report associated shortness of breath.  Denies fevers, chills, abdominal pain, constipation, diarrhea, nausea, vomiting.  ED Course: Vital signs in the ED notable for heart rate in the 100s-110s.  Lab workup notable for CMP with potassium 3.0, chloride 95, creatinine stable at 2.76, albumin  2.5, alk phos 130.  CBC with hemoglobin stable 11.7.  Troponin flat at 37, 32.  Chest x-ray with mild left basilar atelectasis versus scarring.  CTA PE study negative for PE, showed chronic small bilateral effusions as well as some stable and some new atelectasis.  Patient received fentanyl  and aspirin  in the ED.  EDP to consult cardiology for recommendations and assistance with provocative testing if indicated.  Review of Systems: As per HPI otherwise all other systems reviewed and are negative.  Past Medical History:  Diagnosis Date   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   Aortic atherosclerosis 03/27/2020   Atypical pneumonia 12/12/2020   Benign prostatic hyperplasia 09/27/2014   CAD (coronary artery disease) 07/30/2018    CHF (congestive heart failure)    CKD (chronic kidney disease) stage 4, GFR 15-29 ml/min 03/27/2020   Diabetic polyneuropathy    Elevated liver enzymes 08/09/2019   Hiatal hernia    History of coronary artery stent placement    History of pulmonary embolism 08/09/2019   HTN (hypertension) 11/12/2009   Hyperlipidemia associated with type 2 diabetes mellitus 11/12/2009   Left leg weakness 11/23/2018   Mild dementia, 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 LIMA TO LAD, RIMA TO PDA, RADIAL ARTERY TO CIRC AND SEQUENTIALLY TO OM1.   Sleep difficulties 05/30/2020   Type II diabetes mellitus 10/10/2014    Past Surgical History:  Procedure Laterality Date   A/V FISTULAGRAM Left 08/12/2022   Procedure: A/V Fistulagram;  Surgeon: Lanis Fonda BRAVO, MD;  Location: [facility] INVASIVE CV LAB;  Service: Cardiovascular;  Laterality: Left;   A/V FISTULAGRAM N/A 09/07/2023   Procedure: A/V Fistulagram;  Surgeon: Serene Gaile ORN, MD;  Location: [facility] INVASIVE CV LAB;  Service: Cardiovascular;  Laterality: N/A;   APPLICATION OF WOUND VAC N/A 08/24/2019   Procedure: APPLICATION OF WOUND VAC;  Surgeon: German Bartlett PEDLAR, MD;  Location: [facility] OR;  Service: Thoracic;  Laterality: N/A;   APPLICATION OF WOUND VAC  08/29/2019   Procedure: Wound Vac change;  Surgeon: German Bartlett PEDLAR, MD;  Location: [facility] OR;  Service: Open Heart Surgery;;   APPLICATION OF WOUND VAC N/A 09/04/2019   Procedure: Application Of Wound Vac;  Surgeon: Army Dallas NOVAK,  MD;  Location: [facility] OR;  Service: Open Heart Surgery;  Laterality: N/A;   APPLICATION OF WOUND VAC N/A 09/06/2019   Procedure: APPLICATION OF ACELL, APPLICATION OF WOUND VAC USING PREVENA INCISIONAL  DRESSING;  Surgeon: Lowery Estefana RAMAN, DO;  Location: [facility] OR;  Service: Plastics;  Laterality: N/A;   AV FISTULA PLACEMENT Left  09/18/2020   Procedure: ARTERIOVENOUS (AV) FISTULA CREATION LEFT VERSUS GRAFT;  Surgeon: Serene Gaile ORN, MD;  Location: [facility] OR;  Service: Vascular;  Laterality: Left;   BASCILIC VEIN TRANSPOSITION Left 10/31/2020   Procedure: LEFT SECOND STAGE BASCILIC VEIN TRANSPOSITION;  Surgeon: Serene Gaile ORN, MD;  [facility]: [facility];  Service: Vascular;  Laterality: Left;   CARDIAC CATHETERIZATION     CORONARY ARTERY BYPASS GRAFT N/A 08/15/2019   Procedure: CORONARY ARTERY BYPASS GRAFTING (CABG), x 4 using bilateral IMAs and left radial artery .  LIMA TO LAD, RIMA TO PDA, RADIAL ARTERY TO CIRC AND SEQUENTIALLY TO OM1.;  Surgeon: German Bartlett PEDLAR, MD;  Location: [facility];  Service: Open Heart Surgery;  Laterality: N/A;   CORONARY STENT PLACEMENT  02/27/2014   distal rt/pd coronary       dr ladona   CYSTO/ BLADDER BIOPSY'S/ CAUTHERIZATION  01-14-2004  DR CHALES   EXPLORATION POST OPERATIVE OPEN HEART N/A 08/16/2019   Procedure: Chest Closure S?P CABG WITH APPLICATION OF PREVENA  INCISIONAL WOUND VAC;  Surgeon: German Bartlett PEDLAR, MD;  [facility]: [facility];  Service: Open Heart Surgery;  Laterality: N/A;   EXPLORATION POST OPERATIVE OPEN HEART N/A 08/21/2019   Procedure: CHEST WASHOUT S/P OPEN CHEST;  Surgeon: German Bartlett PEDLAR, MD;  [facility]: [facility];  Service: Open Heart Surgery;  Laterality: N/A;  Open chest with Esmark dressing with Ioban sealant coverage.   EXPLORATION POST OPERATIVE OPEN HEART N/A 08/18/2019   Procedure: EXPLORATION POST OPERATIVE OPEN HEART (performed 04/23 on unit);  Surgeon: German Bartlett PEDLAR, MD;  Location: [facility] OR;  Service: Open Heart Surgery;  Laterality: N/A;   EXPLORATION POST OPERATIVE OPEN HEART N/A 08/24/2019   Procedure: CHEST WASHOUT POST OPERATIVE OPEN HEART;  Surgeon: German Bartlett PEDLAR, MD;  [facility];  Service: Open Heart Surgery;  Laterality: N/A;   EXPLORATION POST OPERATIVE OPEN HEART N/A 08/29/2019   Procedure: CHEST WOUND WASHOUT POST OPERATIVE OPEN HEART;  Surgeon: German Bartlett PEDLAR, MD;  [facility];  Service: Open Heart Surgery;  Laterality: N/A;   EXPLORATION POST OPERATIVE OPEN HEART N/A 09/04/2019   Procedure: MEDIASTINAL EXPLORATION WITH STERNAL WOUND IRRIGATION;  Surgeon: Army Dallas NOVAK, MD;  Location: [facility] OR;  Service: Open Heart Surgery;  Laterality: N/A;   EXPLORATION POST OPERATIVE OPEN HEART N/A 09/14/2019   Procedure: EVACUATION OF HEMATOMA;  Surgeon: Army Dallas NOVAK, MD;  [facility] OR;  Service: Open Heart Surgery;  Laterality: N/A;   INSERTION OF DIALYSIS CATHETER N/A 09/16/2023   Procedure: INSERTION OF DIALYSIS CATHETER;  Surgeon: Gretta Lonni PARAS, MD;  [facility]: [facility];  Service: Vascular;  Laterality: N/A;  TUNNELED DIALYSIS CATHETER   IR FLUORO GUIDE CV LINE LEFT  09/19/2019   IR FLUORO GUIDE CV LINE RIGHT  09/08/2023   IR GASTROSTOMY TUBE MOD SED  10/04/2019   IR GASTROSTOMY TUBE REMOVAL  11/29/2019   IR PATIENT EVAL TECH 0-60 MINS  09/29/2019   IR US  GUIDE VASC ACCESS LEFT  09/19/2019   IR US  GUIDE VASC ACCESS RIGHT  09/08/2023   LAPAROSCOPIC APPENDECTOMY N/A 08/31/2023   Procedure: APPENDECTOMY, LAPAROSCOPIC;  Surgeon: Paola Bolk  N, MD;  [facility]: [facility] OR;  Service: General;  Laterality: N/A;   LAPAROSCOPIC LYSIS OF ADHESIONS N/A 09/06/2019   Procedure: LAPAROSCOPIC OMENTAL HARVEST;  Surgeon: Kinsinger, Herlene Righter, MD;  Location: [facility] OR;  Service: General;  Laterality: N/A;   LEFT HEART CATH AND CORONARY ANGIOGRAPHY N/A 08/10/2019   Procedure: LEFT HEART CATH AND CORONARY ANGIOGRAPHY;  Surgeon: Elmira Newman PARAS, MD;  [facility]: [facility] INVASIVE CV LAB;  Service: Cardiovascular;  Laterality: N/A;   LEFT HEART CATHETERIZATION WITH CORONARY ANGIOGRAM N/A 02/27/2014   Procedure: LEFT HEART CATHETERIZATION WITH CORONARY ANGIOGRAM;  Surgeon: Erick JONELLE Bergamo, MD;  Location: [facility] CATH LAB;  Service: Cardiovascular;  Laterality: N/A;   MEDIASTINAL EXPLORATION N/A 09/06/2019   Procedure: MEDIASTINAL EXPLORATION;  Surgeon: Army Dallas NOVAK, MD;   Location: [facility] OR;  Service: Thoracic;  Laterality: N/A;   PECTORALIS FLAP  09/06/2019   Procedure: Pectoralis ADVANCEMENT Flap;  Surgeon: Lowery Estefana RAMAN, DO;  Location: [facility] OR;  Service: Plastics;;   PERCUTANEOUS CORONARY STENT INTERVENTION (PCI-S)  02/27/2014   Procedure: PERCUTANEOUS CORONARY STENT INTERVENTION (PCI-S);  Surgeon: Erick JONELLE Bergamo, MD;  Location: [facility] CATH LAB;  Service: Cardiovascular;;  rt PDA  3.0/28mm Promus stent   PERIPHERAL VASCULAR INTERVENTION Left 08/12/2022   Procedure: PERIPHERAL VASCULAR INTERVENTION;  Surgeon: Lanis Fonda BRAVO, MD;  Location: [facility] INVASIVE CV LAB;  Service: Cardiovascular;  Laterality: Left;   RADIAL ARTERY HARVEST Left 08/15/2019   Procedure: Radial Artery Harvest;  Surgeon: German Bartlett PEDLAR, MD;  Location: [facility] OR;  Service: Open Heart Surgery;  Laterality: Left;   REMOVAL OF A HERO DEVICE Right 09/16/2023   Procedure: REMOVAL OF TEMPORARY TUNNELED HEMODIALYSIS CATHETER;  Surgeon: Gretta Lonni PARAS, MD;  Location: [facility] OR;  Service: Vascular;  Laterality: Right;   RIB PLATING N/A 09/06/2019   Procedure: STERNAL PLATING;  Surgeon: Army Dallas NOVAK, MD;  Location: [facility] OR;  Service: Thoracic;  Laterality: N/A;   TEE WITHOUT CARDIOVERSION N/A 08/15/2019   Procedure: TRANSESOPHAGEAL ECHOCARDIOGRAM (TEE);  Surgeon: German Bartlett PEDLAR, MD;  Location: [facility] OR;  Service: Open Heart Surgery;  Laterality: N/A;   TRACHEOSTOMY TUBE PLACEMENT  08/29/2019   Procedure: Tracheostomy;  Surgeon: German Bartlett PEDLAR, MD;  Location: [facility] OR;  Service: Open Heart Surgery; Decannulated 6/10/20211   TRANSURETHRAL RESECTION OF PROSTATE  04/04/2012   Procedure: TRANSURETHRAL RESECTION OF THE PROSTATE WITH GYRUS INSTRUMENTS;  Surgeon: Arlena LILLETTE Gal, MD;  Location: [facility];  Service: Urology;  Laterality: N/A;   TRANSURETHRAL RESECTION OF PROSTATE N/A 09/27/2014   Procedure: TRANSURETHRAL RESECTION OF THE PROSTATE ;  Surgeon: Arlena Gal, MD;  Location: [facility]  ORS;  Service: Urology;  Laterality: N/A;   TUNNELLED CATHETER EXCHANGE N/A 09/07/2023   Procedure: TUNNELLED CATHETER EXCHANGE;  Surgeon: Serene Gaile ORN, MD;  Location: [facility] INVASIVE CV LAB;  Service: Cardiovascular;  Laterality: N/A;   ULTRASOUND GUIDANCE FOR VASCULAR ACCESS Right 09/16/2023   Procedure: ULTRASOUND GUIDANCE, FOR VASCULAR ACCESS;  Surgeon: Gretta Lonni PARAS, MD;  Location: [facility] OR;  Service: Vascular;  Laterality: Right;   UPPER GASTROINTESTINAL ENDOSCOPY      Social History  reports that he quit smoking about 50 years ago. His smoking use included cigarettes. He started smoking about 70 years ago. He has a 10 pack-year smoking history. He has never used smokeless tobacco. He reports that he does not currently use alcohol. He reports that he does not use drugs.  No Known Allergies  Family History  Problem Relation Age of Onset  Diabetes Mother    Hypertension Mother    Diabetes Father    Diabetes Brother    Hypertension Brother    Bone cancer Brother    Diabetes Brother   Reviewed on admission  Prior to Admission medications   Medication Sig Start Date End Date Taking? Authorizing Provider  acetaminophen  (TYLENOL ) 325 MG tablet Take 2 tablets (650 mg total) by mouth every 6 (six) hours as needed for mild pain (pain score 1-3), fever or headache. 10/01/23  Yes Danton Reyes DASEN, MD  atorvastatin  (LIPITOR) 20 MG tablet Take 1 tablet (20 mg total) by mouth every evening. 08/24/23  Yes Ladona Heinz, MD  budesonide -formoterol  (SYMBICORT ) 160-4.5 MCG/ACT inhaler INHALE 2 PUFFS INTO THE LUNGS TWICE A DAY 12/09/22  Yes Kara Dorn NOVAK, MD  calcitRIOL (ROCALTROL) 0.25 MCG capsule Take 0.25 mcg by mouth 3 (three) times a week. Take one capsule at dinner after on Mon-Wed-Fri. 07/21/23  Yes [provider]  clopidogrel  (PLAVIX ) 75 MG tablet Take 1 tablet (75 mg total) by mouth daily. 06/24/23  Yes Ladona Heinz, MD  cromolyn  (OPTICROM ) 4 % ophthalmic solution Place 1 drop into  both eyes every Wednesday. At bedtime 12/08/19  Yes [provider]  diphenhydrAMINE  (BENADRYL ) 25 mg capsule Take 1 capsule (25 mg total) by mouth every 6 (six) hours as needed for itching. 10/01/23  Yes Danton Reyes DASEN, MD  docusate sodium  (COLACE) 100 MG capsule Take 1 capsule (100 mg total) by mouth 2 (two) times daily. 08/31/23 08/30/24 Yes Lovick, Dreama SAILOR, MD  ferrous sulfate  325 (65 FE) MG tablet Take 325 mg by mouth daily with breakfast.   Yes [provider]  isosorbide  dinitrate (ISORDIL ) 30 MG tablet Take 30 mg by mouth 3 (three) times daily. 11/13/23  Yes [provider]  metolazone  (ZAROXOLYN ) 5 MG tablet Take 5 mg by mouth 3 (three) times a week. 11/13/23  Yes [provider]  midodrine  (PROAMATINE ) 10 MG tablet Take 2 tablets (20 mg total) by mouth 3 (three) times daily with meals. 10/01/23  Yes Danton Reyes DASEN, MD  multivitamin (RENA-VIT) TABS tablet Take 1 tablet by mouth at bedtime. 10/01/23  Yes Danton Reyes DASEN, MD  NOVOLOG  FLEXPEN 100 UNIT/ML FlexPen Inject into the skin as directed. PRN   Yes [provider]  potassium chloride  (KLOR-CON ) 10 MEQ tablet Take 10 mEq by mouth 3 (three) times daily. 10/06/23  Yes [provider]  senna-docusate (SENOKOT-S) 8.6-50 MG tablet Take 1 tablet by mouth 2 (two) times daily. 10/01/23  Yes Danton Reyes DASEN, MD  calcium  acetate (PHOSLO ) 667 MG capsule Take 1 capsule (667 mg total) by mouth 3 (three) times daily with meals. Patient not taking: Reported on 01/06/2024 10/01/23   Danton Reyes DASEN, MD  oxyCODONE  (OXY IR/ROXICODONE ) 5 MG immediate release tablet Take 5 mg by mouth every 4 (four) hours as needed for moderate pain (pain score 4-6). Patient not taking: Reported on 01/06/2024 10/18/23   [provider]  pantoprazole  (PROTONIX ) 40 MG tablet Take 40 mg by mouth daily. Patient not taking: Reported on 01/06/2024 12/10/23   [provider]    Physical Exam: Vitals:   01/06/24  1400 01/06/24 1520 01/06/24 1643 01/06/24 1800  BP: 127/83 125/83 121/82 130/83  Pulse: (!) 107 (!) 103 (!) 111 (!) 114  Resp: 16 16 18 16   Temp:   97.8 F (36.6 C)   TempSrc:   Oral   SpO2: 100% 100% 98% 100%  Height:  Physical Exam Constitutional:      General: He is not in acute distress.    Appearance: Normal appearance.  HENT:     Head: Normocephalic and atraumatic.     Mouth/Throat:     Mouth: Mucous membranes are moist.     Pharynx: Oropharynx is clear.  Eyes:     Extraocular Movements: Extraocular movements intact.     Pupils: Pupils are equal, round, and reactive to light.  Cardiovascular:     Rate and Rhythm: Regular rhythm. Tachycardia present.     Pulses: Normal pulses.     Heart sounds: Normal heart sounds.  Pulmonary:     Effort: Pulmonary effort is normal. No respiratory distress.     Breath sounds: Normal breath sounds.  Abdominal:     General: Bowel sounds are normal. There is no distension.     Palpations: Abdomen is soft.     Tenderness: There is no abdominal tenderness.  Musculoskeletal:        General: No swelling or deformity.  Skin:    General: Skin is warm and dry.  Neurological:     General: No focal deficit present.     Mental Status: Mental status is at baseline.    Labs on Admission: I have personally reviewed following labs and imaging studies  CBC: Recent Labs  Lab 01/06/24 1229  WBC 7.8  NEUTROABS 4.5  HGB 11.7*  HCT 37.5*  MCV 102.5*  PLT 325    Basic Metabolic Panel: Recent Labs  Lab 01/06/24 1229  NA 136  K 3.0*  CL 95*  CO2 28  GLUCOSE 83  BUN <5*  CREATININE 2.76*  CALCIUM  9.1    GFR: CrCl cannot be calculated (Unknown ideal weight.).  Liver Function Tests: Recent Labs  Lab 01/06/24 1229  AST 41  ALT 19  ALKPHOS 130*  BILITOT 0.7  PROT 6.6  ALBUMIN  2.5*    Urine analysis:    Component Value Date/Time   COLORURINE AMBER (A) 09/23/2023 1232   APPEARANCEUR CLOUDY (A) 09/23/2023 1232    LABSPEC 1.025 09/23/2023 1232   PHURINE 5.0 09/23/2023 1232   GLUCOSEU NEGATIVE 09/23/2023 1232   HGBUR SMALL (A) 09/23/2023 1232   BILIRUBINUR NEGATIVE 09/23/2023 1232   KETONESUR NEGATIVE 09/23/2023 1232   PROTEINUR 100 (A) 09/23/2023 1232   NITRITE NEGATIVE 09/23/2023 1232   LEUKOCYTESUR MODERATE (A) 09/23/2023 1232    Radiological Exams on Admission: CT Angio Chest PE W and/or Wo Contrast Result Date: 01/06/2024 CLINICAL DATA:  High probability for PE. EXAM: CT ANGIOGRAPHY CHEST WITH CONTRAST TECHNIQUE: Multidetector CT imaging of the chest was performed using the standard protocol during bolus administration of intravenous contrast. Multiplanar CT image reconstructions and MIPs were obtained to evaluate the vascular anatomy. RADIATION DOSE REDUCTION: This exam was performed according to the departmental dose-optimization program which includes automated exposure control, adjustment of the mA and/or kV according to patient size and/or use of iterative reconstruction technique. CONTRAST:  60mL OMNIPAQUE  IOHEXOL  350 MG/ML SOLN COMPARISON:  CT of the chest 12/12/2020 FINDINGS: Cardiovascular: Satisfactory opacification of the pulmonary arteries to the segmental level. No evidence of pulmonary embolism. Normal heart size. No pericardial effusion. There are atherosclerotic calcifications of the aorta. Right-sided central venous catheter tip ends in the right atrium. Mediastinum/Nodes: No enlarged mediastinal, hilar, or axillary lymph nodes. Thyroid  gland, trachea, and esophagus demonstrate no significant findings. Lungs/Pleura: There are chronic appearing small bilateral pleural effusions, slightly decreased on the left and increased on the right. Smooth pleural thickening  and calcifications are again seen. There are areas of rounded atelectasis in the bilateral lower lobes. This is unchanged in the left lower lobe and new in the right lower lobe. There stable scarring or atelectasis in the medial right  upper lobe as well. Upper Abdomen: Bilateral adrenal nodularity and thickening is unchanged. There is a small right renal cysts, partially imaged. Musculoskeletal: No chest wall abnormality. No acute or significant osseous findings. Review of the MIP images confirms the above findings. IMPRESSION: 1. No evidence for pulmonary embolism. 2. Chronic small bilateral pleural effusions, slightly decreased on the left and increased on the right. 3. Stable rounded atelectasis in the left lower lobe. 4. New rounded atelectasis in the right lower lobe. 5. Stable bilateral calcified pleural plaques. Aortic Atherosclerosis (ICD10-I70.0). Electronically Signed   By: Greig Pique M.D.   On: 01/06/2024 16:56   DG Chest Portable 1 View Result Date: 01/06/2024 CLINICAL DATA:  Acute chest pain. EXAM: PORTABLE CHEST 1 VIEW COMPARISON:  Sep 23, 2023 FINDINGS: There is stable right-sided venous catheter positioning. Radiopaque sternal fixation plates are seen. The heart size and mediastinal contours are within normal limits. A stable 6 mm nodular opacity is seen overlying the upper right. Mild atelectasis and/or scarring is seen within the left lung base. There is a small left pleural effusion. No pneumothorax is identified. No acute osseous abnormality is identified. IMPRESSION: 1. Mild left basilar atelectasis and/or scarring. 2. Small left pleural effusion. Electronically Signed   By: Suzen Dials M.D.   On: 01/06/2024 13:07    EKG: Independently reviewed./Tachycardia at 121 bpm.  Nonspecific T wave changes, borderline T wave depression/inversion in anterior lateral and inferior lateral leads.  These changes are new compared to May.  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  Worse with inspiration and relieved with activity. > Troponin flat at 37, 32.  EKG appears to have new T wave changes in inferior lateral and anterior lateral leads that are new compared to previous EKG in May. > EDP is consulting cardiology for further input on possible provocative testing - Monitor on telemetry overnight - Appreciate cardiology recommendations and assistance - Hold off on repeat echocardiogram as this was recently done in May - Further provocative testing per cardiology recommendations - Supportive care  Hypertension - Continue home Isordil  and metolazone   Hyperlipidemia - Continue home atorvastatin   Diabetes - SSI  ESRD on HD - Last HD yesterday - Nephrology consult for HD while admitted  Paroxysmal atrial flutter - Not currently on rate control or rhythm control nor anticoagulation  CHF > History of CHF the last echo in May of this year showed EF 55.6%, normal diastolic function, normal RV function. - Is prescribed Metolazone  3 times weekly, will continue  Anemia > Hgb 11.7 - Trend CBC  Mild dementia MS > History of mild dementia and MS.  Not treated for MS for many years from brief chart review.  - Noted  DVT prophylaxis: Heparin   Code Status:   Full  Family Communication:  None on admission Disposition Plan:   Patient is from:  Home  Anticipated DC to:  Home  Anticipated DC date:  1-2 Days  Anticipated DC barriers: None  Consults called:  Cardiology  Admission status:  Observation, Telemetry   Severity of  Illness: The appropriate patient status for this patient is OBSERVATION. Observation status is judged to be reasonable and necessary in order to provide the required intensity of service to ensure the patient's safety. The patient's presenting symptoms, physical exam findings, and initial radiographic and laboratory data in the context of their medical condition is felt to place them at decreased risk for further clinical deterioration. Furthermore, it is anticipated that the patient will be medically stable for discharge from the hospital within 2 midnights of admission.   Marsa KATHEE Scurry MD [facility]  How to contact the [facility] Attending or Consulting provider 7A - 7P or covering provider during after hours 7P -7A, for this patient?   Check the care team in [facility] and look for a) attending/consulting [facility] provider listed and b) the [facility] team listed Log into [email or web address] and use [place]'s universal password to access. If you do not have the password, please contact the hospital operator. Locate the [facility] provider you are looking for under [facility] and page to a number that you can be directly reached. If you still have difficulty reaching the provider, please page the [facility] (Director on Call) for the Hospitalists listed on amion for assistance.  01/06/2024, 6:33 PM

## 2024-01-06 NOTE — ED Provider Notes (Signed)
 3:06 PM Assumed care of patient from off-going team. For more details, please see note from same day.  In brief, this is a 78 y.o. male who p/w chest pain. H/o appendicits a couple of months ago, no abdominal pain. CP resolved w/ fentanyl . H/o ESRD on HD MWF, full tx yesterday. Also has h/o MS, CAD s/p CABG x4, pAF, CHF ischemic cardiomyopathy, h/o PE, T2DM, HTN, HLD.   Plan/Dispo at time of sign-out & ED Course since sign-out: [ ]  CTA chest  BP 127/83   Pulse (!) 107   Temp 97.6 F (36.4 C) (Oral)   Resp 16   Ht 5' 9 (1.753 m)   SpO2 100%   BMI 25.39 kg/m    ED Course:   Clinical Course as of 01/10/24 1810  Thu Jan 06, 2024  1659 CT Angio Chest PE W and/or Wo Contrast 1. No evidence for pulmonary embolism. 2. Chronic small bilateral pleural effusions, slightly decreased on the left and increased on the right. 3. Stable rounded atelectasis in the left lower lobe. 4. New rounded atelectasis in the right lower lobe. 5. Stable bilateral calcified pleural plaques.  Aortic Atherosclerosis (ICD10-I70.0).   [HN]  1756 Discussed with patient and his wife and performed shared decision making.  They would prefer to be admitted to the hospital for chest pain workup.  Patient states that the chest pain hit when he was trying to adjust himself in bed.  When asked if he felt like he was exerting himself at that time he states yes.  Consulted for admission. [HN]    Clinical Course User Index [HN] Franklyn Sid SAILOR, MD    Dispo: Admit for CP ------------------------------- Sid Franklyn, MD Emergency Medicine  This note was created using dictation software, which may contain spelling or grammatical errors.   Franklyn Sid SAILOR, MD 01/10/24 [phone]

## 2024-01-06 NOTE — Telephone Encounter (Signed)
 FYI Only or Action Required?: FYI only for provider.  Patient was last seen in primary care on 12/14/2023 by Rollene Almarie LABOR, MD.  Called Nurse Triage reporting Chest Pain.  Symptoms began today.  Interventions attempted: Nothing.  Symptoms are:  Unsure - chest pain appears to be resolving somewhat - however pt is SOB.  Triage Disposition: Call EMS 911 Now  Patient/caregiver understands and will follow disposition?: Yes                   Copied from CRM #[id number]. Topic: Clinical - Red Word Triage >> Jan 06, 2024 10:37 AM Chiquita SQUIBB wrote: Red Word that prompted transfer to Nurse Triage: Nurse care manager Juana, is calling in with the patient. Patient is having chest discomfort and pain. Patients heart rate is 108-110, BP is 116/76 and 96% saturation. Reason for Disposition  [1] Chest pain lasts > 5 minutes AND [2] history of heart disease (i.e., angina, heart attack, heart failure, bypass surgery, takes nitroglycerin )  Answer Assessment - Initial Assessment Questions 1. LOCATION: Where does it hurt?       Left side - but radiated to right side 2. RADIATION: Does the pain go anywhere else? (e.g., into neck, jaw, arms, back)     Chest only 3. ONSET: When did the chest pain begin? (Minutes, hours or days)      3 am this morning 4. PATTERN: Does the pain come and go, or has it been constant since it started?  Does it get worse with exertion?      Yes - comes and goes - pain is lessening now 5. DURATION: How long does it last (e.g., seconds, minutes, hours)     Has gone  6. SEVERITY: How bad is the pain?  (e.g., Scale 1-10; mild, moderate, or severe)    3-4/10 - at it worse 6-7/10 7. CARDIAC RISK FACTORS: Do you have any history of heart problems or risk factors for heart disease? (e.g., angina, prior heart attack; diabetes, high blood pressure, high cholesterol, smoker, or strong family history of heart disease)     Yes CHF,  8. PULMONARY RISK  FACTORS: Do you have any history of lung disease?  (e.g., blood clots in lung, asthma, emphysema, birth control pills)     Now feeling SOB 9. CAUSE: What do you think is causing the chest pain?     Unsure. 10. OTHER SYMPTOMS: Do you have any other symptoms? (e.g., dizziness, nausea, vomiting, sweating, fever, difficulty breathing, cough)       Short of breath  Protocols used: Chest Pain-A-AH

## 2024-01-06 NOTE — Telephone Encounter (Signed)
 Wasn't able to speak with patient or the wife but did leave a voicemail stating that if patient is still having the chest pains or SOB to call 911 immediately  and if not please be taken to closet ED near by.

## 2024-01-06 NOTE — Consult Note (Signed)
 Reason for Consult: To manage dialysis and dialysis related needs Referring Physician: Seena Marsa NOVAK, MD   [name] is an 78 y.o. male.  HPI: 12M PMH ESKD on HD MWF, CAD s/p multiple stents, 4vCABG, pAFlutter, HF, MS, PE, BPH who presents for chest pain.  ED, vitals with HR 100-110s.  Labs: K 3.0, CO2 28, Cr 2.76, Tn 37,32, Hgb 11.7.  CTA Chest negative for PE.   Seen in the ED.  Normally dialyzes MWF, last on Wednesday.     Dialyzes at [facility]  EDW 60.6. Access R-TDC.  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   Diabetic polyneuropathy    Elevated liver enzymes 08/09/2019   Hiatal hernia    History of coronary artery stent placement    History of pulmonary embolism 08/09/2019   HTN (hypertension) 11/12/2009   Hyperlipidemia associated with type 2 diabetes mellitus 11/12/2009   Left leg weakness 11/23/2018   Mild dementia, 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 LIMA TO LAD, RIMA TO PDA, RADIAL ARTERY TO CIRC AND SEQUENTIALLY TO OM1.   Sleep difficulties 05/30/2020   Type II diabetes mellitus 10/10/2014    Past Surgical History:  Procedure Laterality Date   A/V FISTULAGRAM Left 08/12/2022   Procedure: A/V Fistulagram;  Surgeon: Lanis Fonda BRAVO, MD;  Location: [facility] INVASIVE CV LAB;  Service: Cardiovascular;  Laterality: Left;   A/V FISTULAGRAM N/A 09/07/2023   Procedure: A/V Fistulagram;  Surgeon: Serene Gaile ORN, MD;  Location: [facility] INVASIVE CV LAB;  Service: Cardiovascular;  Laterality: N/A;   APPLICATION OF  WOUND VAC N/A 08/24/2019   Procedure: APPLICATION OF WOUND VAC;  Surgeon: German Bartlett PEDLAR, MD;  Location: [facility] OR;  Service: Thoracic;  Laterality: N/A;   APPLICATION OF WOUND VAC  08/29/2019   Procedure: Wound Vac change;  Surgeon: German Bartlett PEDLAR, MD;  Location: [facility] OR;  Service: Open Heart Surgery;;   APPLICATION OF WOUND VAC N/A 09/04/2019   Procedure: Application Of Wound Vac;  Surgeon: Army Dallas NOVAK, MD;  Location: [facility] OR;  Service: Open Heart Surgery;  Laterality: N/A;   APPLICATION OF WOUND VAC N/A 09/06/2019   Procedure: APPLICATION OF ACELL, APPLICATION OF WOUND VAC USING PREVENA INCISIONAL  DRESSING;  Surgeon: Lowery Estefana GORMAN, DO;  Location: [facility] OR;  Service: Plastics;  Laterality: N/A;   AV FISTULA PLACEMENT Left 09/18/2020   Procedure: ARTERIOVENOUS (AV) FISTULA CREATION LEFT VERSUS GRAFT;  Surgeon: Serene Gaile ORN, MD;  Location: [facility] OR;  Service: Vascular;  Laterality: Left;   BASCILIC VEIN TRANSPOSITION Left 10/31/2020   Procedure: LEFT SECOND STAGE BASCILIC VEIN TRANSPOSITION;  Surgeon: Serene Gaile ORN, MD;  Location: [facility] OR;  Service: Vascular;  Laterality: Left;   CARDIAC CATHETERIZATION     CORONARY ARTERY BYPASS GRAFT N/A 08/15/2019   Procedure: CORONARY ARTERY BYPASS GRAFTING (CABG), x 4 using bilateral IMAs and left radial artery .  LIMA TO LAD, RIMA TO PDA, RADIAL ARTERY TO CIRC AND SEQUENTIALLY TO OM1.;  Surgeon: German Bartlett PEDLAR, MD;  Location: [facility] OR;  Service:  Open Heart Surgery;  Laterality: N/A;   CORONARY STENT PLACEMENT  02/27/2014   distal rt/pd coronary       dr ladona   CYSTO/ BLADDER BIOPSY'S/ CAUTHERIZATION  01-14-2004  DR CHALES   EXPLORATION POST OPERATIVE OPEN HEART N/A 08/16/2019   Procedure: Chest Closure S?P CABG WITH APPLICATION OF PREVENA  INCISIONAL WOUND VAC;  Surgeon: German Bartlett PEDLAR, MD;  Location: [facility] OR;  Service: Open Heart Surgery;  Laterality: N/A;   EXPLORATION POST OPERATIVE OPEN HEART N/A 08/21/2019   Procedure: CHEST WASHOUT S/P OPEN  CHEST;  Surgeon: German Bartlett PEDLAR, MD;  Location: [facility] OR;  Service: Open Heart Surgery;  Laterality: N/A;  Open chest with Esmark dressing with Ioban sealant coverage.   EXPLORATION POST OPERATIVE OPEN HEART N/A 08/18/2019   Procedure: EXPLORATION POST OPERATIVE OPEN HEART (performed 04/23 on unit);  Surgeon: German Bartlett PEDLAR, MD;  Location: [facility] OR;  Service: Open Heart Surgery;  Laterality: N/A;   EXPLORATION POST OPERATIVE OPEN HEART N/A 08/24/2019   Procedure: CHEST WASHOUT POST OPERATIVE OPEN HEART;  Surgeon: German Bartlett PEDLAR, MD;  [facility];  Service: Open Heart Surgery;  Laterality: N/A;   EXPLORATION POST OPERATIVE OPEN HEART N/A 08/29/2019   Procedure: CHEST WOUND WASHOUT POST OPERATIVE OPEN HEART;  Surgeon: German Bartlett PEDLAR, MD;  [facility];  Service: Open Heart Surgery;  Laterality: N/A;   EXPLORATION POST OPERATIVE OPEN HEART N/A 09/04/2019   Procedure: MEDIASTINAL EXPLORATION WITH STERNAL WOUND IRRIGATION;  Surgeon: Army Dallas NOVAK, MD;  Location: [facility] OR;  Service: Open Heart Surgery;  Laterality: N/A;   EXPLORATION POST OPERATIVE OPEN HEART N/A 09/14/2019   Procedure: EVACUATION OF HEMATOMA;  Surgeon: Army Dallas NOVAK, MD;  Location: [facility] OR;  Service: Open Heart Surgery;  Laterality: N/A;   INSERTION OF DIALYSIS CATHETER N/A 09/16/2023   Procedure: INSERTION OF DIALYSIS CATHETER;  Surgeon: Gretta Lonni PARAS, MD;  [facility] OR;  Service: Vascular;  Laterality: N/A;  TUNNELED DIALYSIS CATHETER   IR FLUORO GUIDE CV LINE LEFT  09/19/2019   IR FLUORO GUIDE CV LINE RIGHT  09/08/2023   IR GASTROSTOMY TUBE MOD SED  10/04/2019   IR GASTROSTOMY TUBE REMOVAL  11/29/2019   IR PATIENT EVAL TECH 0-60 MINS  09/29/2019   IR US  GUIDE VASC ACCESS LEFT  09/19/2019   IR US  GUIDE VASC ACCESS RIGHT  09/08/2023   LAPAROSCOPIC APPENDECTOMY N/A 08/31/2023   Procedure: APPENDECTOMY, LAPAROSCOPIC;  Surgeon: Paola Dreama SAILOR, MD;  [facility]: [facility] OR;  Service: General;  Laterality: N/A;   LAPAROSCOPIC LYSIS  OF ADHESIONS N/A 09/06/2019   Procedure: LAPAROSCOPIC OMENTAL HARVEST;  Surgeon: Stevie Herlene Righter, MD;  Location: [facility] OR;  Service: General;  Laterality: N/A;   LEFT HEART CATH AND CORONARY ANGIOGRAPHY N/A 08/10/2019   Procedure: LEFT HEART CATH AND CORONARY ANGIOGRAPHY;  Surgeon: Elmira Newman PARAS, MD;  [facility] INVASIVE CV LAB;  Service: Cardiovascular;  Laterality: N/A;   LEFT HEART CATHETERIZATION WITH CORONARY ANGIOGRAM N/A 02/27/2014   Procedure: LEFT HEART CATHETERIZATION WITH CORONARY ANGIOGRAM;  Surgeon: Erick JONELLE ladona, MD;  Location: [facility] CATH LAB;  Service: Cardiovascular;  Laterality: N/A;   MEDIASTINAL EXPLORATION N/A 09/06/2019   Procedure: MEDIASTINAL EXPLORATION;  Surgeon: Army Dallas NOVAK, MD;  Location: [facility] OR;  Service: Thoracic;  Laterality: N/A;   PECTORALIS FLAP  09/06/2019   Procedure: Pectoralis ADVANCEMENT Flap;  Surgeon: Lowery Estefana RAMAN, DO;  Location: [facility] OR;  Service: Plastics;;   PERCUTANEOUS CORONARY STENT INTERVENTION (PCI-S)  02/27/2014  Procedure: PERCUTANEOUS CORONARY STENT INTERVENTION (PCI-S);  Surgeon: Erick JONELLE Bergamo, MD;  Location: [facility] CATH LAB;  Service: Cardiovascular;;  rt PDA  3.0/28mm Promus stent   PERIPHERAL VASCULAR INTERVENTION Left 08/12/2022   Procedure: PERIPHERAL VASCULAR INTERVENTION;  Surgeon: Lanis Fonda BRAVO, MD;  Location: [facility] INVASIVE CV LAB;  Service: Cardiovascular;  Laterality: Left;   RADIAL ARTERY HARVEST Left 08/15/2019   Procedure: Radial Artery Harvest;  Surgeon: German Bartlett PEDLAR, MD;  Location: [facility] OR;  Service: Open Heart Surgery;  Laterality: Left;   REMOVAL OF A HERO DEVICE Right 09/16/2023   Procedure: REMOVAL OF TEMPORARY TUNNELED HEMODIALYSIS CATHETER;  Surgeon: Gretta Lonni PARAS, MD;  Location: [facility] OR;  Service: Vascular;  Laterality: Right;   RIB PLATING N/A 09/06/2019   Procedure: STERNAL PLATING;  Surgeon: Army Dallas NOVAK, MD;  Location: [facility] OR;  Service: Thoracic;  Laterality: N/A;   TEE WITHOUT  CARDIOVERSION N/A 08/15/2019   Procedure: TRANSESOPHAGEAL ECHOCARDIOGRAM (TEE);  Surgeon: German Bartlett PEDLAR, MD;  Location: [facility] OR;  Service: Open Heart Surgery;  Laterality: N/A;   TRACHEOSTOMY TUBE PLACEMENT  08/29/2019   Procedure: Tracheostomy;  Surgeon: German Bartlett PEDLAR, MD;  Location: [facility] OR;  Service: Open Heart Surgery; Decannulated 6/10/20211   TRANSURETHRAL RESECTION OF PROSTATE  04/04/2012   Procedure: TRANSURETHRAL RESECTION OF THE PROSTATE WITH GYRUS INSTRUMENTS;  Surgeon: Arlena LILLETTE Gal, MD;  Location: [facility];  Service: Urology;  Laterality: N/A;   TRANSURETHRAL RESECTION OF PROSTATE N/A 09/27/2014   Procedure: TRANSURETHRAL RESECTION OF THE PROSTATE ;  Surgeon: Arlena Gal, MD;  Location: [facility] ORS;  Service: Urology;  Laterality: N/A;   TUNNELLED CATHETER EXCHANGE N/A 09/07/2023   Procedure: TUNNELLED CATHETER EXCHANGE;  Surgeon: Serene Gaile ORN, MD;  [facility] INVASIVE CV LAB;  Service: Cardiovascular;  Laterality: N/A;   ULTRASOUND GUIDANCE FOR VASCULAR ACCESS Right 09/16/2023   Procedure: ULTRASOUND GUIDANCE, FOR VASCULAR ACCESS;  Surgeon: Gretta Lonni PARAS, MD;  Location: [facility] OR;  Service: Vascular;  Laterality: Right;   UPPER GASTROINTESTINAL ENDOSCOPY      Family History  Problem Relation Age of Onset   Diabetes Mother    Hypertension Mother    Diabetes Father    Diabetes Brother    Hypertension Brother    Bone cancer Brother    Diabetes Brother     Social History:  reports that he quit smoking about 50 years ago. His smoking use included cigarettes. He started smoking about 70 years ago. He has a 10 pack-year smoking history. He has never used smokeless tobacco. He reports that he does not currently use alcohol. He reports that he does not use drugs.  Allergies: No Known Allergies  Medications: I have reviewed the patient's current medications.   Results for orders placed or performed during the hospital encounter of 01/06/24 (from  the past 48 hours)  Comprehensive metabolic panel     Status: Abnormal   Collection Time: 01/06/24 12:29 PM  Result Value Ref Range   Sodium 136 135 - 145 mmol/L   Potassium 3.0 (L) 3.5 - 5.1 mmol/L   Chloride 95 (L) 98 - 111 mmol/L   CO2 28 22 - 32 mmol/L   Glucose, Bld 83 70 - 99 mg/dL    Comment: Glucose reference range applies only to samples taken after fasting for at least 8 hours.   BUN <5 (L) 8 - 23 mg/dL   Creatinine, Ser 7.23 (H) 0.61 - 1.24 mg/dL   Calcium  9.1 8.9 - 10.3 mg/dL  Total Protein 6.6 6.5 - 8.1 g/dL   Albumin  2.5 (L) 3.5 - 5.0 g/dL   AST 41 15 - 41 U/L   ALT 19 0 - 44 U/L   Alkaline Phosphatase 130 (H) 38 - 126 U/L   Total Bilirubin 0.7 0.0 - 1.2 mg/dL   GFR, Estimated 23 (L) >60 mL/min    Comment: (NOTE) Calculated using the CKD-EPI Creatinine Equation (2021)    Anion gap 13 5 - 15    Comment: Performed at [facility], [address], [place], [place] [address]  Troponin I (High Sensitivity)     Status: Abnormal   Collection Time: 01/06/24 12:29 PM  Result Value Ref Range   Troponin I (High Sensitivity) 37 (H) <18 ng/L    Comment: (NOTE) Elevated high sensitivity troponin I (hsTnI) values and significant  changes across serial measurements may suggest ACS but many other  chronic and acute conditions are known to elevate hsTnI results.  Refer to the Links section for chest pain algorithms and additional  guidance. Performed at [facility], [address] [place], [place] [address]   CBC with Differential     Status: Abnormal   Collection Time: 01/06/24 12:29 PM  Result Value Ref Range   WBC 7.8 4.0 - 10.5 K/uL   RBC 3.66 (L) 4.22 - 5.81 MIL/uL   Hemoglobin 11.7 (L) 13.0 - 17.0 g/dL   HCT 62.4 (L) 60.9 - 47.9 %   MCV 102.5 (H) 80.0 - 100.0 fL   MCH 32.0 26.0 - 34.0 pg   MCHC 31.2 30.0 - 36.0 g/dL   RDW 84.9 88.4 - 84.4 %   Platelets 325 150 - 400 K/uL   nRBC 0.0 0.0 - 0.2 %   Neutrophils Relative % 58 %   Neutro Abs 4.5 1.7 -  7.7 K/uL   Lymphocytes Relative 26 %   Lymphs Abs 2.0 0.7 - 4.0 K/uL   Monocytes Relative 14 %   Monocytes Absolute 1.1 (H) 0.1 - 1.0 K/uL   Eosinophils Relative 1 %   Eosinophils Absolute 0.1 0.0 - 0.5 K/uL   Basophils Relative 1 %   Basophils Absolute 0.1 0.0 - 0.1 K/uL   Immature Granulocytes 0 %   Abs Immature Granulocytes 0.03 0.00 - 0.07 K/uL    Comment: Performed at [facility], [address], [place], [place] [address]  Troponin I (High Sensitivity)     Status: Abnormal   Collection Time: 01/06/24  2:12 PM  Result Value Ref Range   Troponin I (High Sensitivity) 32 (H) <18 ng/L    Comment: (NOTE) Elevated high sensitivity troponin I (hsTnI) values and significant  changes across serial measurements may suggest ACS but many other  chronic and acute conditions are known to elevate hsTnI results.  Refer to the Links section for chest pain algorithms and additional  guidance. Performed at [facility], [address] [place], [place] [address]     CT Angio Chest PE W and/or Wo Contrast Result Date: 01/06/2024 CLINICAL DATA:  High probability for PE. EXAM: CT ANGIOGRAPHY CHEST WITH CONTRAST TECHNIQUE: Multidetector CT imaging of the chest was performed using the standard protocol during bolus administration of intravenous contrast. Multiplanar CT image reconstructions and MIPs were obtained to evaluate the vascular anatomy. RADIATION DOSE REDUCTION: This exam was performed according to the departmental dose-optimization program which includes automated exposure control, adjustment of the mA and/or kV according to patient size and/or use of iterative  reconstruction technique. CONTRAST:  60mL OMNIPAQUE  IOHEXOL  350 MG/ML SOLN COMPARISON:  CT of the chest 12/12/2020 FINDINGS: Cardiovascular: Satisfactory opacification of the pulmonary arteries to the segmental level. No evidence of pulmonary embolism. Normal heart size. No pericardial effusion. There are atherosclerotic  calcifications of the aorta. Right-sided central venous catheter tip ends in the right atrium. Mediastinum/Nodes: No enlarged mediastinal, hilar, or axillary lymph nodes. Thyroid  gland, trachea, and esophagus demonstrate no significant findings. Lungs/Pleura: There are chronic appearing small bilateral pleural effusions, slightly decreased on the left and increased on the right. Smooth pleural thickening and calcifications are again seen. There are areas of rounded atelectasis in the bilateral lower lobes. This is unchanged in the left lower lobe and new in the right lower lobe. There stable scarring or atelectasis in the medial right upper lobe as well. Upper Abdomen: Bilateral adrenal nodularity and thickening is unchanged. There is a small right renal cysts, partially imaged. Musculoskeletal: No chest wall abnormality. No acute or significant osseous findings. Review of the MIP images confirms the above findings. IMPRESSION: 1. No evidence for pulmonary embolism. 2. Chronic small bilateral pleural effusions, slightly decreased on the left and increased on the right. 3. Stable rounded atelectasis in the left lower lobe. 4. New rounded atelectasis in the right lower lobe. 5. Stable bilateral calcified pleural plaques. Aortic Atherosclerosis (ICD10-I70.0). Electronically Signed   By: Greig Pique M.D.   On: 01/06/2024 16:56   DG Chest Portable 1 View Result Date: 01/06/2024 CLINICAL DATA:  Acute chest pain. EXAM: PORTABLE CHEST 1 VIEW COMPARISON:  Sep 23, 2023 FINDINGS: There is stable right-sided venous catheter positioning. Radiopaque sternal fixation plates are seen. The heart size and mediastinal contours are within normal limits. A stable 6 mm nodular opacity is seen overlying the upper right. Mild atelectasis and/or scarring is seen within the left lung base. There is a small left pleural effusion. No pneumothorax is identified. No acute osseous abnormality is identified. IMPRESSION: 1. Mild left basilar  atelectasis and/or scarring. 2. Small left pleural effusion. Electronically Signed   By: Suzen Dials M.D.   On: 01/06/2024 13:07    ROS: Chest pressure, SOB.  No N/V/D. Blood pressure 130/83, pulse (!) 114, temperature 97.8 F (36.6 C), temperature source Oral, resp. rate 16, height 5' 9 (1.753 m), SpO2 100%. General appearance: alert, cooperative, appears older than stated age, and no distress Resp: clear to auscultation bilaterally Cardio: tachycardic, no edema  Assessment/Plan: 1 Chest pain: Troponin stable.  CTA no PE.  Defer to primary 2 ESRD: MWF.  EDW 60.6.  No urgent indication for HD tonight.  Plan routine HD tomorrow.    Zaccheus Edmister M Jaynie Hitch 01/06/2024, 7:30 PM

## 2024-01-06 NOTE — Patient Outreach (Signed)
 Complex Care Management   Visit Note  01/06/2024  Name:  Christian [name] MRN: [id number] DOB: [birth year]  Situation: Referral received for Complex Care Management related to recent discharge from SNF, Heart Failure and ESRD I obtained verbal consent from Patient.  Visit completed with Patient and Kalid Ghan, spouse  on the phone  Background:   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   Diabetic polyneuropathy    Elevated liver enzymes 08/09/2019   Hiatal hernia    History of coronary artery stent placement    History of pulmonary embolism 08/09/2019   HTN (hypertension) 11/12/2009   Hyperlipidemia associated with type 2 diabetes mellitus 11/12/2009   Left leg weakness 11/23/2018   Mild dementia, 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 LIMA TO LAD, RIMA TO PDA, RADIAL ARTERY TO CIRC AND SEQUENTIALLY TO OM1.   Sleep difficulties 05/30/2020   Type II diabetes mellitus 10/10/2014    Assessment: Patient reports, reports chest pain since around 3am this morning- he repors he got tangled up inthe covers and was unable to get himself out, so he continued to lay there. Occupational therapist, Oneil, with [facility] arrived during the call. Per Mark, BP 116/76, HR 108-110. O2 sat 96%. Per OT, patient not clammy or diaphoretic. Patient also reports breathing. Per patient, has experienced before, but states it normally goes away. Conference call with Triage nurse completed. Spoke with Paulette Fox-recommendation was call 911. Initially,  patient refused, but agreed after further explanation the recommendation.  Patient Reported Symptoms:  Cognitive Cognitive Status: No symptoms reported      Neurological Neurological Review of Symptoms: No symptoms reported    HEENT HEENT Symptoms Reported: No symptoms reported      Cardiovascular Cardiovascular Symptoms Reported: Chest pain or discomfort Does patient have uncontrolled Hypertension?: No Cardiovascular Comment: reports chest pain since around 3am this morning  Respiratory Respiratory Symptoms Reported: Shortness of breath    Endocrine Endocrine Symptoms Reported: No symptoms reported    Gastrointestinal Gastrointestinal Symptoms Reported: No symptoms reported      Genitourinary Genitourinary Symptoms Reported: No symptoms reported    Integumentary Integumentary Symptoms Reported: No symptoms reported    Musculoskeletal Musculoskelatal Symptoms Reviewed: Limited mobility Additional Musculoskeletal Details: home health therapy        Psychosocial Psychosocial Symptoms Reported: No symptoms reported         Recommendation:   Emergent care-911 called per Triage nurse  Follow Up Plan:   RNCM will continue to follow  Heddy Shutter, RN, MSN, BSN, CCM [place]  [facility], Population Health Case Manager Phone: [phone]

## 2024-01-06 NOTE — ED Notes (Signed)
Patient was given a meal tray

## 2024-01-07 ENCOUNTER — Telehealth: Payer: Self-pay

## 2024-01-07 DIAGNOSIS — E1122 Type 2 diabetes mellitus with diabetic chronic kidney disease: Secondary | ICD-10-CM | POA: Diagnosis not present

## 2024-01-07 DIAGNOSIS — F02A11 Dementia in other diseases classified elsewhere, mild, with agitation: Secondary | ICD-10-CM | POA: Diagnosis not present

## 2024-01-07 DIAGNOSIS — D631 Anemia in chronic kidney disease: Secondary | ICD-10-CM | POA: Diagnosis not present

## 2024-01-07 DIAGNOSIS — E1169 Type 2 diabetes mellitus with other specified complication: Secondary | ICD-10-CM | POA: Diagnosis not present

## 2024-01-07 DIAGNOSIS — N186 End stage renal disease: Secondary | ICD-10-CM | POA: Diagnosis not present

## 2024-01-07 DIAGNOSIS — G35 Multiple sclerosis: Secondary | ICD-10-CM | POA: Diagnosis not present

## 2024-01-07 DIAGNOSIS — I480 Paroxysmal atrial fibrillation: Secondary | ICD-10-CM | POA: Diagnosis not present

## 2024-01-07 DIAGNOSIS — R0789 Other chest pain: Secondary | ICD-10-CM | POA: Diagnosis not present

## 2024-01-07 DIAGNOSIS — I2510 Atherosclerotic heart disease of native coronary artery without angina pectoris: Secondary | ICD-10-CM | POA: Diagnosis not present

## 2024-01-07 DIAGNOSIS — I509 Heart failure, unspecified: Secondary | ICD-10-CM | POA: Diagnosis not present

## 2024-01-07 DIAGNOSIS — E1142 Type 2 diabetes mellitus with diabetic polyneuropathy: Secondary | ICD-10-CM | POA: Diagnosis not present

## 2024-01-07 DIAGNOSIS — I4892 Unspecified atrial flutter: Secondary | ICD-10-CM | POA: Diagnosis not present

## 2024-01-07 DIAGNOSIS — Z992 Dependence on renal dialysis: Secondary | ICD-10-CM | POA: Diagnosis not present

## 2024-01-07 DIAGNOSIS — R079 Chest pain, unspecified: Secondary | ICD-10-CM | POA: Diagnosis not present

## 2024-01-07 DIAGNOSIS — I132 Hypertensive heart and chronic kidney disease with heart failure and with stage 5 chronic kidney disease, or end stage renal disease: Secondary | ICD-10-CM | POA: Diagnosis not present

## 2024-01-07 LAB — RENAL FUNCTION PANEL
Albumin: 2.1 g/dL — ABNORMAL LOW (ref 3.5–5.0)
Anion gap: 10 (ref 5–15)
BUN: 5 mg/dL — ABNORMAL LOW (ref 8–23)
CO2: 27 mmol/L (ref 22–32)
Calcium: 8.2 mg/dL — ABNORMAL LOW (ref 8.9–10.3)
Chloride: 96 mmol/L — ABNORMAL LOW (ref 98–111)
Creatinine, Ser: 3.13 mg/dL — ABNORMAL HIGH (ref 0.61–1.24)
GFR, Estimated: 20 mL/min — ABNORMAL LOW (ref >60)
Glucose, Bld: 85 mg/dL (ref 70–99)
Phosphorus: 2.1 mg/dL — ABNORMAL LOW (ref 2.5–4.6)
Potassium: 3 mmol/L — ABNORMAL LOW (ref 3.5–5.1)
Sodium: 133 mmol/L — ABNORMAL LOW (ref 135–145)

## 2024-01-07 LAB — HEPATITIS B SURFACE ANTIGEN: Hepatitis B Surface Ag: NONREACTIVE

## 2024-01-07 LAB — GLUCOSE, CAPILLARY
Glucose-Capillary: 73 mg/dL (ref 70–99)
Glucose-Capillary: 71 mg/dL (ref 70–99)

## 2024-01-07 LAB — SEDIMENTATION RATE: Sed Rate: 9 mm/h (ref 0–16)

## 2024-01-07 MED ORDER — HEPARIN SODIUM (PORCINE) 1000 UNIT/ML IJ SOLN
INTRAMUSCULAR | Status: AC
Start: 2024-01-07 — End: 2024-01-07
  Filled 2024-01-07: qty 4

## 2024-01-07 MED ORDER — MIDODRINE HCL 5 MG PO TABS
ORAL_TABLET | ORAL | Status: AC
  Filled 2024-01-07: qty 4

## 2024-01-07 NOTE — Telephone Encounter (Signed)
 Forms from [facility] CERT and plan of care was received on 01/03/2024 and fax back successfully on 01/07/2024

## 2024-01-07 NOTE — Discharge Planning (Addendum)
 [facility] Patient Discharge Orders- [facility] CLINIC: [place]  Patient's name: Christian [name] Admit/DC Dates: 01/06/2024 - Anticipate dc today 01/07/24  Discharge Diagnoses: Chest pain - w/u negative. Hx CAD s/p CABG   HTN - continue home meds  Aranesp : Given: No    Last Hgb: 11.7 PRBC's Given: No  ESA dose for discharge: N/A IV Iron dose at discharge: Continue weekly Venofer   Heparin  change: No  EDW Change: No  Bath Change: No  Access intervention/Change: No  Hectorol/Calcitriol change: N/A  Discharge Labs: Calcium  8.2  Phosphorus 2.1  Albumin  2.1  K+ 3.0  IV Antibiotics: No  On Coumadin?: No   OTHER/APPTS/LAB ORDERS:  Re-check K+ level pre-HD on Monday 9/15    D/C Meds to be reconciled by nurse after every discharge.  Completed By: Charmaine Piety, NP   Reviewed by: MD:______ RN_______

## 2024-01-07 NOTE — Progress Notes (Signed)
 Rounding Note    Patient Name: Christian [name] Date of Encounter: 01/07/2024  [facility] Cardiologist: Gordy Bergamo, MD   Subjective   No longer having chest pain. He notes pain was sharp, spread from left to right side of his chest, worse with coughing. He has felt some subjective chills, has felt like he needs to cough but doesn't bring anything up.  Inpatient Medications    Scheduled Meds:  atorvastatin   20 mg Oral QPM   Chlorhexidine  Gluconate Cloth  6 each Topical Q0600   clopidogrel   75 mg Oral Daily   [START ON 01/12/2024] cromolyn   1 drop Both Eyes Q Wed   feeding supplement (NEPRO CARB STEADY)  237 mL Oral BID BM   heparin   5,000 Units Subcutaneous Q8H   insulin  aspart  0-6 Units Subcutaneous TID WC   isosorbide  dinitrate  30 mg Oral TID   metolazone   5 mg Oral Once per day on Monday Wednesday Friday   midodrine   20 mg Oral TID WC   Continuous Infusions:  PRN Meds: acetaminophen , ondansetron  (ZOFRAN ) IV   Vital Signs    Vitals:   01/07/24 1112 01/07/24 1230 01/07/24 1240 01/07/24 1245  BP: 109/72 105/77 108/74 106/78  Pulse: (!) 107 (!) 121 (!) 111 (!) 119  Resp: 18 14 (!) 22 20  Temp: 97.6 F (36.4 C) 98.1 F (36.7 C)    TempSrc: Oral     SpO2: 98% 99% 99% 100%  Weight:  62 kg    Height:        Intake/Output Summary (Last 24 hours) at 01/07/2024 1325 Last data filed at 01/07/2024 1301 Gross per 24 hour  Intake 0 ml  Output --  Net 0 ml      01/07/2024   12:30 PM 10/01/2023    7:35 PM 10/01/2023    3:43 PM  Last 3 Weights  Weight (lbs) 136 lb 11 oz 171 lb 15.3 oz 175 lb 4.3 oz  Weight (kg) 62 kg 78 kg 79.5 kg      Telemetry    Sinus tach - Personally Reviewed  Physical Exam   GEN: No acute distress.   Neck: No JVD Cardiac: RRR, no murmurs, rubs, or gallops.  Respiratory: Clear to auscultation bilaterally. GI: Soft, nontender, non-distended  MS: No edema; No deformity. Neuro:  Nonfocal  Psych: Normal affect   New pertinent  results (labs, ECG, imaging, cardiac studies)     Assessment & Plan    Chest pain -very helpful that his hsTn are not severely elevated--with ESRD he has poor clearance, would expect any bump in Tn would linger. His are within his baseline range -pain is also atypical--sharp, worse with deep inspiration -will add on inflammatory markers. I personally reviewed CT images yesterday, no large effusion but can't clear see if there is fat pad vs. Very small pericardial effusion. -CTPE excludes PE, does have bilateral small pleural effusions -I cannot clearly visualize grafts on CT to assess patency. Has not had cath since his CABG -we discussed options. With resolution of pain, atypical pattern, reassuring Tn, will not pursue ischemic evaluation at this time -reviewed red flag warning signs that need immediate medical attention  CAD with prior CABG and PCI Hyperlipidemia -had stent to PDA in 2015 and then CABG x4 in 2021 with very complex post op course including cardiogenic shock, cardiac arrest, acute CVA , renal failure -has not had cath since CABG but has had several nuclear stress tests that have been  unremarkable -continue clopidogrel  -LDL 56, continue atorvastatin  20 mg daily -has been managed with imdur. Dr. Godfrey note stated he was on metoprolol  at that time but patient not currently on this; on chart review, this was discontinued during his admission in late May-early June due to hypotension. Discussed with patient, he endorsed low blood pressure on this, he is already on midodrine , will not add at this time  ESRD on HD -dialysis, diuretic per nephrology  Chronic hypotension -requires midodrine   Paroxysmal atrial flutter History of GI bleeding -flutter noted 08/2023 after complex abdominal post op course but spontaneously converted, was on amiodarone  but this was stopped. Metoprolol  stopped due to hypotension that admission -not chronically anticoagulated due to history of GI  bleed, has not had documented recurrence  No further inpatient workup at this time. Cardiology will sign off, no change to outpatient medications. We will arrange for outpatient cardiology follow up.    Signed, Shelda Bruckner, MD  01/07/2024, 1:25 PM

## 2024-01-07 NOTE — Progress Notes (Addendum)
 Pt receives out-pt HD at [facility] clinic, MWF 1025 chair time. Will assist as needed.   Lavanda Maize [facility] Navigator [phone]  Addendum 3:28pm D/c orders noted. Contacted out-pt HD, Rhett [facility] at this time to inform of pt d/c and anticipated arrival back at clinic for treatment on Monday. No further assistance needed at this time.

## 2024-01-07 NOTE — Progress Notes (Signed)
 Patient refused early morning lab draw.

## 2024-01-07 NOTE — TOC Transition Note (Addendum)
 Transition of Care [facility]) - Discharge Note   Patient Details  Name: Christian [name] MRN: [id number] Date of Birth: 03/12/1946  [facility] CM/SW Contact:  [name], RN Phone Number: 01/07/2024, 2:25 PM   Clinical Narrative:    For dc today, he is in HD right now, patient states to call his wife,  he usually takes [facility] home but they close at 5 pm , he may still be in HD, so may need ptar transport.   Per Neprhology MD , patient will come off HD at 4:15 to make it to his w/chair transport by 5pm.  Wife brought w/chair to hospital.         Patient Goals and CMS Choice            Discharge Placement                       Discharge Plan and Services Additional resources added to the After Visit Summary for                                       Social Drivers of Health (SDOH) Interventions SDOH Screenings   Food Insecurity: No Food Insecurity (01/06/2024)  Housing: Low Risk  (01/06/2024)  Transportation Needs: No Transportation Needs (01/06/2024)  Utilities: Not At Risk (01/06/2024)  Alcohol Screen: Low Risk  (07/23/2021)  Depression (PHQ2-9): Low Risk  (01/18/2023)  Financial Resource Strain: Low Risk  (07/23/2021)  Physical Activity: Inactive (07/23/2021)  Social Connections: Socially Integrated (01/06/2024)  Stress: No Stress Concern Present (07/23/2021)  Tobacco Use: Medium Risk (12/14/2023)     Readmission Risk Interventions     No data to display

## 2024-01-07 NOTE — Progress Notes (Signed)
   01/07/24 1623  Vitals  Temp (!) 97.5 F (36.4 C)  Pulse Rate (!) 108  Resp (!) 22  BP 137/85  SpO2 99 %  O2 Device Room Air  Weight 61.9 kg  Type of Weight Post-Dialysis  Post Treatment  Dialyzer Clearance Lightly streaked  Hemodialysis Intake (mL) 0 mL  Liters Processed 68.3  Fluid Removed (mL) 400 mL  Tolerated HD Treatment Yes  Post-Hemodialysis Comments TX TERMINATED 30 MINS EARLY PER NP FOR D/C   Received patient in bed to unit.  Alert and oriented.  Informed consent signed and in chart.   TX duration:3.5HRS  Patient tolerated well.  Transported back to the room  Alert, without acute distress.  Hand-off given to patient's nurse.   Access used: [facility] Access issues: NONE  Total UF removed: Medication(s) given: MIDODRINE    [facility]

## 2024-01-07 NOTE — TOC CM/SW Note (Signed)
 Transition of Care [facility]) - Inpatient Brief Assessment   Patient Details  Name: Christian [name] MRN: [id number] Date of Birth: 1946-03-29  Transition of Care [facility]) CM/SW Contact:    [name], RN Phone Number: 01/07/2024, 2:21 PM   Clinical Narrative: From home with spouse, has PCP and insurance on file, states has no HH services in place at this time  but has an aide on M, W, F from 7:30 to ( or 10 am , has a walker, w/chair, hospbed, at home.  States he may need ambulance transport at  [facility], he states [facility] usually transports him , and family is support system, states gets medications from [facility] on [address].  Pta bed bound.   Patient states he needs a hoyer lift and has no preference of the agency.  This patient gives this NCM permission to call his wife to speak about his transport home today.    Transition of Care Asessment: Insurance and Status: Insurance coverage has been reviewed Patient has primary care physician: Yes Home environment has been reviewed: home with wife Prior level of function:: bed bound Prior/Current Home Services: Current home services (walker, w/chair, hosp bed,) Social Drivers of Health Review: SDOH reviewed no interventions necessary Readmission risk has been reviewed: Yes Transition of care needs: transition of care needs identified, TOC will continue to follow

## 2024-01-07 NOTE — Care Management Obs Status (Signed)
 MEDICARE OBSERVATION STATUS NOTIFICATION   Patient Details  Name: Christian [name] MRN: [id number] Date of Birth: 12-12-1945   Medicare Observation Status Notification Given:  Yes    [name] 01/07/2024, 9:39 AM

## 2024-01-07 NOTE — Discharge Summary (Signed)
 Physician Discharge Summary  Christian [name] FMW:[id number] DOB: 1945/10/23 DOA: 01/06/2024  PCP: Rollene Almarie LABOR, MD  Admit date: 01/06/2024 Discharge date: 01/07/2024    Admitted From: Home Disposition: Home  Recommendations for Outpatient Follow-up:  Follow up with PCP in 1-2 weeks Please obtain BMP/CBC in one week Please follow up with your PCP on the following pending results: Unresulted Labs (From admission, onward)     Start     Ordered   01/07/24 1300  C-reactive protein  Once,   R        01/07/24 1300   01/07/24 1249  CBC with Differential/Platelet  Once,   R       Question:  Specimen collection method  Answer:  Lab=Lab collect   01/07/24 1249   01/07/24 1249  Renal function panel  Once,   R       Question:  Specimen collection method  Answer:  Lab=Lab collect   01/07/24 1249   01/07/24 0801  Sedimentation rate  Add-on,   AD       Question:  Specimen collection method  Answer:  Lab=Lab collect   01/07/24 0800   01/07/24 0500  Hepatitis B surface antigen  Once,   R        01/07/24 0500   01/07/24 0500  Hepatitis B surface antibody,quantitative  Once,   R        01/07/24 0500              Home Health: None Equipment/Devices: None  Discharge Condition: Stable CODE STATUS: Full code Diet recommendation:  Diet Order             Diet renal/carb modified with fluid restriction Diet-HS Snack? Nothing; Fluid restriction: 1200 mL Fluid; Room service appropriate? Yes; Fluid consistency: Thin  Diet effective now                 Due to brief hospitalization, I have copied admitting hospitalist HPI and ED course as below.  HPI: Christian [name] is a 78 y.o. male with medical history significant of hypertension, hyperlipidemia, diabetes, ESRD on HD, CAD status post stenting and CABG x 4, paroxysmal atrial flutter, CHF, mild dementia, multiple sclerosis, PE, anemia, BPH presenting with chest pain.   Patient says he started to have significant chest pain at 2  AM.  Intermittently severe.  Described as sharp and starting on the left side radiating to the right.  Increased with deep inspiration and relieved with rest.   Patient is chest pain-free now but reports he is concerned about significant difficulty with outpatient follow-up with cardiology.  Will be more comfortable with in-hospital evaluation if possible.   Does report associated shortness of breath.   Denies fevers, chills, abdominal pain, constipation, diarrhea, nausea, vomiting.   ED Course: Vital signs in the ED notable for heart rate in the 100s-110s.   Lab workup notable for CMP with potassium 3.0, chloride 95, creatinine stable at 2.76, albumin  2.5, alk phos 130.  CBC with hemoglobin stable 11.7.  Troponin flat at 37, 32.   Chest x-ray with mild left basilar atelectasis versus scarring.   CTA PE study negative for PE, showed chronic small bilateral effusions as well as some stable and some new atelectasis.   Patient received fentanyl  and aspirin  in the ED.  EDP to consult cardiology for recommendations and assistance with provocative testing if indicated.  Subjective: Patient seen and examined earlier, he did not have any complaint, denied any chest pain, palpitation  or shortness of breath.  Discussed plan of discharge with him and he was in agreement.  Brief/Interim Summary: Details of hospitalization as below.  Chest pain, rule out MI / CAD /history of being status post CABG > Patient presented with chest pain, Troponin flat at 37, 32.  EKG appears to have new T wave changes in inferior lateral and anterior lateral leads that are new compared to previous EKG in May.  Seen by cardiology, per them, troponin slightly elevated but flat expected in ESRD patient and indicate demand ischemia, they think patient's chest pain was noncardiac and he is symptom-free, they have cleared him for discharge and advised to resume all home medications.   Hypertension - Continue home Isordil  and  metolazone    Hyperlipidemia - Continue home atorvastatin    Diabetes Resume PTA medications   ESRD on HD - Last HD yesterday, nephrology on board, he is getting dialysis now and then will be discharged afterwards.  Resume outpatient dialysis.   Paroxysmal atrial flutter - Not currently on rate control or rhythm control nor anticoagulation.  Currently in sinus rhythm.   CHF > History of CHF the last echo in May of this year showed EF 55.6%, normal diastolic function, normal RV function. - Is prescribed Metolazone  3 times weekly, will continue   Anemia > Hgb 11.7 - Trend CBC   Mild dementia MS > History of mild dementia and MS.  Not treated for MS for many years from brief chart review.  - Noted  Discharge plan was discussed with patient and/or family member and they verbalized understanding and agreed with it.  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   atorvastatin  20 MG tablet Commonly known as: LIPITOR Take 1 tablet (20 mg total) by mouth every evening.   budesonide -formoterol  160-4.5 MCG/ACT inhaler Commonly known  as: SYMBICORT  INHALE 2 PUFFS INTO THE LUNGS TWICE A DAY   calcitRIOL 0.25 MCG capsule Commonly known as: ROCALTROL Take 0.25 mcg by mouth 3 (three) times a week. Take one capsule at dinner after on Mon-Wed-Fri.   calcium  acetate 667 MG capsule Commonly known as: PHOSLO  Take 1 capsule (667 mg total) by mouth 3 (three) times daily with meals.   clopidogrel  75 MG tablet Commonly known as: PLAVIX  Take 1 tablet (75 mg total) by mouth daily.   cromolyn  4 % ophthalmic solution Commonly known as: OPTICROM  Place 1 drop into both eyes every Wednesday. At bedtime   diphenhydrAMINE  25 mg capsule Commonly known as: BENADRYL  Take 1 capsule (25 mg total) by mouth every 6 (six)  hours as needed for itching.   docusate sodium  100 MG capsule Commonly known as: Colace Take 1 capsule (100 mg total) by mouth 2 (two) times daily.   ferrous sulfate  325 (65 FE) MG tablet Take 325 mg by mouth daily with breakfast.   isosorbide  dinitrate 30 MG tablet Commonly known as: ISORDIL  Take 30 mg by mouth 3 (three) times daily.   metolazone  5 MG tablet Commonly known as: ZAROXOLYN  Take 5 mg by mouth 3 (three) times a week.   midodrine  10 MG tablet Commonly known as: PROAMATINE  Take 2 tablets (20 mg total) by mouth 3 (three) times daily with meals.   multivitamin Tabs tablet Take 1 tablet by mouth at bedtime.   NovoLOG  FlexPen 100 UNIT/ML FlexPen Generic drug: insulin  aspart Inject into the skin as directed. PRN   oxyCODONE  5 MG immediate release tablet Commonly known as: Oxy IR/ROXICODONE  Take 5 mg by mouth every 4 (four) hours as needed for moderate pain (pain score 4-6).   pantoprazole  40 MG tablet Commonly known as: PROTONIX  Take 40 mg by mouth daily.   potassium chloride  10 MEQ tablet Commonly known as: KLOR-CON  Take 10 mEq by mouth 3 (three) times daily.   senna-docusate 8.6-50 MG tablet Commonly known as: Senokot-S Take 1 tablet by mouth 2 (two) times daily.        Follow-up  Information     Rollene Almarie LABOR, MD Follow up in 1 week(s).   Specialty: Internal Medicine Contact information: [address] [place] [address] [phone]                No Known Allergies  Consultations: Nephrology and cardiology   Procedures/Studies: CT Angio Chest PE W and/or Wo Contrast Result Date: 01/06/2024 CLINICAL DATA:  High probability for PE. EXAM: CT ANGIOGRAPHY CHEST WITH CONTRAST TECHNIQUE: Multidetector CT imaging of the chest was performed using the standard protocol during bolus administration of intravenous contrast. Multiplanar CT image reconstructions and MIPs were obtained to evaluate the vascular anatomy. RADIATION DOSE REDUCTION: This exam was performed according to the departmental dose-optimization program which includes automated exposure control, adjustment of the mA and/or kV according to patient size and/or use of iterative reconstruction technique. CONTRAST:  60mL OMNIPAQUE  IOHEXOL  350 MG/ML SOLN COMPARISON:  CT of the chest 12/12/2020 FINDINGS: Cardiovascular: Satisfactory opacification of the pulmonary arteries to the segmental level. No evidence of pulmonary embolism. Normal heart size. No pericardial effusion. There are atherosclerotic calcifications of the aorta. Right-sided central venous catheter tip ends in the right atrium. Mediastinum/Nodes: No enlarged mediastinal, hilar, or axillary lymph nodes. Thyroid  gland, trachea, and esophagus demonstrate no significant findings. Lungs/Pleura: There are chronic appearing small bilateral pleural effusions, slightly decreased on the left and increased on the right. Smooth pleural thickening and calcifications are again seen. There are areas of rounded atelectasis in the bilateral lower lobes. This is unchanged in the left lower lobe and new in the right lower lobe. There stable scarring or atelectasis in the medial right upper lobe as well. Upper Abdomen: Bilateral adrenal nodularity and  thickening is unchanged. There is a small right renal cysts, partially imaged. Musculoskeletal: No chest wall abnormality. No acute or significant osseous findings. Review of the MIP images confirms the above findings. IMPRESSION: 1. No evidence for pulmonary embolism. 2. Chronic small bilateral pleural effusions, slightly decreased on the left and increased on the right. 3. Stable rounded atelectasis in the left lower lobe. 4. New rounded atelectasis in the right lower lobe. 5. Stable bilateral  calcified pleural plaques. Aortic Atherosclerosis (ICD10-I70.0). Electronically Signed   By: Greig Pique M.D.   On: 01/06/2024 16:56   DG Chest Portable 1 View Result Date: 01/06/2024 CLINICAL DATA:  Acute chest pain. EXAM: PORTABLE CHEST 1 VIEW COMPARISON:  Sep 23, 2023 FINDINGS: There is stable right-sided venous catheter positioning. Radiopaque sternal fixation plates are seen. The heart size and mediastinal contours are within normal limits. A stable 6 mm nodular opacity is seen overlying the upper right. Mild atelectasis and/or scarring is seen within the left lung base. There is a small left pleural effusion. No pneumothorax is identified. No acute osseous abnormality is identified. IMPRESSION: 1. Mild left basilar atelectasis and/or scarring. 2. Small left pleural effusion. Electronically Signed   By: Suzen Dials M.D.   On: 01/06/2024 13:07     Discharge Exam: Vitals:   01/07/24 1240 01/07/24 1245  BP: 108/74 106/78  Pulse: (!) 111 (!) 119  Resp: (!) 22 20  Temp:    SpO2: 99% 100%   Vitals:   01/07/24 1112 01/07/24 1230 01/07/24 1240 01/07/24 1245  BP: 109/72 105/77 108/74 106/78  Pulse: (!) 107 (!) 121 (!) 111 (!) 119  Resp: 18 14 (!) 22 20  Temp: 97.6 F (36.4 C) 98.1 F (36.7 C)    TempSrc: Oral     SpO2: 98% 99% 99% 100%  Weight:  62 kg    Height:        General: Pt is alert, awake, not in acute distress Cardiovascular: RRR, S1/S2 +, no rubs, no gallops Respiratory: CTA  bilaterally, no wheezing, no rhonchi Abdominal: Soft, NT, ND, bowel sounds + Extremities: no edema, no cyanosis    The results of significant diagnostics from this hospitalization (including imaging, microbiology, ancillary and laboratory) are listed below for reference.     Microbiology: No results found for this or any previous visit (from the past 240 hours).   Labs: BNP (last 3 results) No results for input(s): BNP in the last 8760 hours. Basic Metabolic Panel: Recent Labs  Lab 01/06/24 1229  NA 136  K 3.0*  CL 95*  CO2 28  GLUCOSE 83  BUN <5*  CREATININE 2.76*  CALCIUM  9.1   Liver Function Tests: Recent Labs  Lab 01/06/24 1229  AST 41  ALT 19  ALKPHOS 130*  BILITOT 0.7  PROT 6.6  ALBUMIN  2.5*   No results for input(s): LIPASE, AMYLASE in the last 168 hours. No results for input(s): AMMONIA in the last 168 hours. CBC: Recent Labs  Lab 01/06/24 1229  WBC 7.8  NEUTROABS 4.5  HGB 11.7*  HCT 37.5*  MCV 102.5*  PLT 325   Cardiac Enzymes: No results for input(s): CKTOTAL, CKMB, CKMBINDEX, TROPONINI in the last 168 hours. BNP: Invalid input(s): POCBNP CBG: Recent Labs  Lab 01/06/24 1929 01/07/24 0841 01/07/24 1126  GLUCAP 77 73 71   D-Dimer No results for input(s): DDIMER in the last 72 hours. Hgb A1c No results for input(s): HGBA1C in the last 72 hours. Lipid Profile No results for input(s): CHOL, HDL, LDLCALC, TRIG, CHOLHDL, LDLDIRECT in the last 72 hours. Thyroid  function studies No results for input(s): TSH, T4TOTAL, T3FREE, THYROIDAB in the last 72 hours.  Invalid input(s): FREET3 Anemia work up No results for input(s): VITAMINB12, FOLATE, FERRITIN, TIBC, IRON, RETICCTPCT in the last 72 hours. Urinalysis    Component Value Date/Time   COLORURINE AMBER (A) 09/23/2023 1232   APPEARANCEUR CLOUDY (A) 09/23/2023 1232   LABSPEC 1.025 09/23/2023 1232  PHURINE 5.0 09/23/2023 1232    GLUCOSEU NEGATIVE 09/23/2023 1232   HGBUR SMALL (A) 09/23/2023 1232   BILIRUBINUR NEGATIVE 09/23/2023 1232   KETONESUR NEGATIVE 09/23/2023 1232   PROTEINUR 100 (A) 09/23/2023 1232   NITRITE NEGATIVE 09/23/2023 1232   LEUKOCYTESUR MODERATE (A) 09/23/2023 1232   Sepsis Labs Recent Labs  Lab 01/06/24 1229  WBC 7.8   Microbiology No results found for this or any previous visit (from the past 240 hours).  FURTHER DISCHARGE INSTRUCTIONS:   Get Medicines reviewed and adjusted: Please take all your medications with you for your next visit with your Primary MD   Laboratory/radiological data: Please request your Primary MD to go over all hospital tests and procedure/radiological results at the follow up, please ask your Primary MD to get all Hospital records sent to his/her office.   In some cases, they will be blood work, cultures and biopsy results pending at the time of your discharge. Please request that your primary care M.D. goes through all the records of your hospital data and follows up on these results.   Also Note the following: If you experience worsening of your admission symptoms, develop shortness of breath, life threatening emergency, suicidal or homicidal thoughts you must seek medical attention immediately by calling 911 or calling your MD immediately  if symptoms less severe.   You must read complete instructions/literature along with all the possible adverse reactions/side effects for all the Medicines you take and that have been prescribed to you. Take any new Medicines after you have completely understood and accpet all the possible adverse reactions/side effects.    patient was instructed, not to drive, operate heavy machinery, perform activities at heights, swimming or participation in water  activities or provide baby-sitting services while on Pain, Sleep and Anxiety Medications; until their outpatient Physician has advised to do so again. Also recommended to not to take  more than prescribed Pain, Sleep and Anxiety Medications.  It is not advisable to combine anxiety, sleep and pain medications without talking with your primary care provider.     Wear Seat belts while driving.   Please note: You were cared for by a hospitalist during your hospital stay. Once you are discharged, your primary care physician will handle any further medical issues. Please note that NO REFILLS for any discharge medications will be authorized once you are discharged, as it is imperative that you return to your primary care physician (or establish a relationship with a primary care physician if you do not have one) for your post hospital discharge needs so that they can reassess your need for medications and monitor your lab values  Time coordinating discharge: Over 30 minutes  SIGNED:   Fredia Skeeter, MD  [facility] 01/07/2024, 1:20 PM *Please note that this is a verbal dictation therefore any spelling or grammatical errors are due to the Dragon Medical One system interpretation. If 7PM-7AM, please contact night-coverage [email or web address]

## 2024-01-07 NOTE — Progress Notes (Signed)
 [facility] Progress Note   Subjective:    Seen and examined patient on HD. Tolerating minimal UF . On RA and denies CP and SOB. BP is 103/78. Noted discharge order for today.  Objective Vitals:   01/07/24 1112 01/07/24 1230 01/07/24 1240 01/07/24 1245  BP: 109/72 105/77 108/74 106/78  Pulse: (!) 107 (!) 121 (!) 111 (!) 119  Resp: 18 14 (!) 22 20  Temp: 97.6 F (36.4 C) 98.1 F (36.7 C)    TempSrc: Oral     SpO2: 98% 99% 99% 100%  Weight:  62 kg    Height:       Physical Exam General: Elderly male, thin, NAD, on RA Heart: S1 and S2; No murmurs, gallops, or rubs Lungs: Clear anteriorly Abdomen: Soft and non-tender Extremities: No LE edema Dialysis Access: [facility]   Filed Weights   01/07/24 1230  Weight: 62 kg    Intake/Output Summary (Last 24 hours) at 01/07/2024 1322 Last data filed at 01/07/2024 1301 Gross per 24 hour  Intake 0 ml  Output --  Net 0 ml    Additional Objective Labs: Basic Metabolic Panel: Recent Labs  Lab 01/06/24 1229  NA 136  K 3.0*  CL 95*  CO2 28  GLUCOSE 83  BUN <5*  CREATININE 2.76*  CALCIUM  9.1   Liver Function Tests: Recent Labs  Lab 01/06/24 1229  AST 41  ALT 19  ALKPHOS 130*  BILITOT 0.7  PROT 6.6  ALBUMIN  2.5*   No results for input(s): LIPASE, AMYLASE in the last 168 hours. CBC: Recent Labs  Lab 01/06/24 1229  WBC 7.8  NEUTROABS 4.5  HGB 11.7*  HCT 37.5*  MCV 102.5*  PLT 325   Blood Culture    Component Value Date/Time   SDES ABDOMEN 09/27/2023 1001   SPECREQUEST Normal 09/27/2023 1001   CULT  09/27/2023 1001    ABUNDANT BACTEROIDES FRAGILIS BETA LACTAMASE POSITIVE ABUNDANT ESCHERICHIA COLI Confirmed Extended Spectrum Beta-Lactamase Producer (ESBL).  In bloodstream infections from ESBL organisms, carbapenems are preferred over piperacillin /tazobactam. They are shown to have a lower risk of mortality.    REPTSTATUS 10/02/2023 FINAL 09/27/2023 1001    Cardiac Enzymes: No results  for input(s): CKTOTAL, CKMB, CKMBINDEX, TROPONINI in the last 168 hours. CBG: Recent Labs  Lab 01/06/24 1929 01/07/24 0841 01/07/24 1126  GLUCAP 77 73 71   Iron Studies: No results for input(s): IRON, TIBC, TRANSFERRIN, FERRITIN in the last 72 hours. Lab Results  Component Value Date   INR 1.1 09/26/2023   INR 1.1 10/04/2019   INR 1.2 09/14/2019   Studies/Results: CT Angio Chest PE W and/or Wo Contrast Result Date: 01/06/2024 CLINICAL DATA:  High probability for PE. EXAM: CT ANGIOGRAPHY CHEST WITH CONTRAST TECHNIQUE: Multidetector CT imaging of the chest was performed using the standard protocol during bolus administration of intravenous contrast. Multiplanar CT image reconstructions and MIPs were obtained to evaluate the vascular anatomy. RADIATION DOSE REDUCTION: This exam was performed according to the departmental dose-optimization program which includes automated exposure control, adjustment of the mA and/or kV according to patient size and/or use of iterative reconstruction technique. CONTRAST:  60mL OMNIPAQUE  IOHEXOL  350 MG/ML SOLN COMPARISON:  CT of the chest 12/12/2020 FINDINGS: Cardiovascular: Satisfactory opacification of the pulmonary arteries to the segmental level. No evidence of pulmonary embolism. Normal heart size. No pericardial effusion. There are atherosclerotic calcifications of the aorta. Right-sided central venous catheter tip ends in the right atrium. Mediastinum/Nodes: No enlarged mediastinal, hilar, or axillary lymph nodes. Thyroid   gland, trachea, and esophagus demonstrate no significant findings. Lungs/Pleura: There are chronic appearing small bilateral pleural effusions, slightly decreased on the left and increased on the right. Smooth pleural thickening and calcifications are again seen. There are areas of rounded atelectasis in the bilateral lower lobes. This is unchanged in the left lower lobe and new in the right lower lobe. There stable scarring or  atelectasis in the medial right upper lobe as well. Upper Abdomen: Bilateral adrenal nodularity and thickening is unchanged. There is a small right renal cysts, partially imaged. Musculoskeletal: No chest wall abnormality. No acute or significant osseous findings. Review of the MIP images confirms the above findings. IMPRESSION: 1. No evidence for pulmonary embolism. 2. Chronic small bilateral pleural effusions, slightly decreased on the left and increased on the right. 3. Stable rounded atelectasis in the left lower lobe. 4. New rounded atelectasis in the right lower lobe. 5. Stable bilateral calcified pleural plaques. Aortic Atherosclerosis (ICD10-I70.0). Electronically Signed   By: Greig Pique M.D.   On: 01/06/2024 16:56   DG Chest Portable 1 View Result Date: 01/06/2024 CLINICAL DATA:  Acute chest pain. EXAM: PORTABLE CHEST 1 VIEW COMPARISON:  Sep 23, 2023 FINDINGS: There is stable right-sided venous catheter positioning. Radiopaque sternal fixation plates are seen. The heart size and mediastinal contours are within normal limits. A stable 6 mm nodular opacity is seen overlying the upper right. Mild atelectasis and/or scarring is seen within the left lung base. There is a small left pleural effusion. No pneumothorax is identified. No acute osseous abnormality is identified. IMPRESSION: 1. Mild left basilar atelectasis and/or scarring. 2. Small left pleural effusion. Electronically Signed   By: Suzen Dials M.D.   On: 01/06/2024 13:07    Medications:   atorvastatin   20 mg Oral QPM   Chlorhexidine  Gluconate Cloth  6 each Topical Q0600   clopidogrel   75 mg Oral Daily   [START ON 01/12/2024] cromolyn   1 drop Both Eyes Q Wed   feeding supplement (NEPRO CARB STEADY)  237 mL Oral BID BM   heparin   5,000 Units Subcutaneous Q8H   insulin  aspart  0-6 Units Subcutaneous TID WC   isosorbide  dinitrate  30 mg Oral TID   metolazone   5 mg Oral Once per day on Monday Wednesday Friday   midodrine   20 mg  Oral TID WC    Dialysis Orders: [facility] 4hrs BFR 400 DFR Auto 1.5 2K/2Ca EDW 60.6kg No heparin  bolus ordered Venofer  50mg  IV weekly  Assessment/Plan: 1. Chest pain - Seen by Cardiology. Troponins slightly elevated. Likely demand ischemia. Currently denies CP and he's noted he's cleared for discharge. 2. ESRD -on HD MWF. On HD. Noted K+ 3. Will re-check level in outpatient and adjust bath if indicated. 3. Anemia of CKD- Hgb 11.7-stable. Resume weekly Venofer  in outpatient. 4. Secondary hyperparathyroidism - Checking routine labs in outpatient 5. HTN/volume - BP soft but stable. Euvolemic on exam 6. Dispo - Okay for discharge from a renal standpoint  Charmaine Piety, NP [facility] 01/07/2024,1:22 PM  LOS: 0 days

## 2024-01-08 LAB — HEPATITIS B SURFACE ANTIBODY, QUANTITATIVE: Hep B S AB Quant (Post): 9 m[IU]/mL — ABNORMAL LOW

## 2024-01-09 ENCOUNTER — Telehealth: Payer: Self-pay | Admitting: Nephrology

## 2024-01-09 NOTE — Telephone Encounter (Signed)
 Transition of Care Contact from Inpatient Facility   Date of Discharge: 01/07/24  Date of Contact: 01/09/24 Method of contact: phone Talked to patient   Patient contacted to discuss transition of care form recent hospitaliztion. Patient was admitted to [facility] from 9/11 to 01/07/24 with the discharge diagnosis of chest pain rule out MI.    Medication changes were reviewed - out of midodrine  and pantoprazole  - will call in refill.  Patient will follow up with is outpatient dialysis center tomorrow.   Other follow up needs include: none identified.    Manuelita Labella, PA-C [employer]

## 2024-01-10 ENCOUNTER — Telehealth: Payer: Self-pay

## 2024-01-10 NOTE — Transitions of Care (Post Inpatient/ED Visit) (Signed)
 01/10/2024  Name: Christian [name] MRN: [id number] DOB: [birth year]  Today's TOC FU Call Status: Today's TOC FU Call Status:: Successful TOC FU Call Completed TOC FU Call Complete Date: 01/10/24 Patient's Name and Date of Birth confirmed.  Transition Care Management Follow-up Telephone Call Date of Discharge: 01/07/24 Discharge Facility: [facility]) Type of Discharge: Inpatient Admission Primary Inpatient Discharge Diagnosis:: chest pain How have you been since you were released from the hospital?: Better Any questions or concerns?: No  Items Reviewed: Did you receive and understand the discharge instructions provided?: Yes Medications obtained,verified, and reconciled?: Yes (Medications Reviewed) Any new allergies since your discharge?: No Dietary orders reviewed?: Yes Do you have support at home?: Yes People in Home [RPT]: spouse  Medications Reviewed Today: Medications Reviewed Today     Reviewed by Emmitt Pan, LPN (Licensed Practical Nurse) on 01/10/24 at 1506  Med List Status: <None>   Medication Order Taking? Sig Documenting Provider Last Dose Status Informant  acetaminophen  (TYLENOL ) 325 MG tablet [id number] Yes Take 2 tablets (650 mg total) by mouth every 6 (six) hours as needed for mild pain (pain score 1-3), fever or headache. Danton Reyes DASEN, MD  Active Self, Spouse/Significant Other  atorvastatin  (LIPITOR) 20 MG tablet [id number] Yes Take 1 tablet (20 mg total) by mouth every evening. Ladona Heinz, MD  Active Spouse/Significant Other, Self  budesonide -formoterol  (SYMBICORT ) 160-4.5 MCG/ACT inhaler [id number] Yes INHALE 2 PUFFS INTO THE LUNGS TWICE A DAY Kara Dorn NOVAK, MD  Active Spouse/Significant Other, Self  calcitRIOL (ROCALTROL) 0.25 MCG capsule [id number] Yes Take 0.25 mcg by mouth 3 (three) times a week. Take one capsule at dinner after on Mon-Wed-Fri. [provider]  Active Spouse/Significant Other, Self           Med Note (LEE, [name]    Tue Aug 31, 2023  7:24 AM) Taken on Mon-Wed-Fri  calcium  acetate (PHOSLO ) 667 MG capsule [id number]  Take 1 capsule (667 mg total) by mouth 3 (three) times daily with meals.  Patient not taking: Reported on 01/10/2024   Danton Reyes DASEN, MD  Active Self, Spouse/Significant Other  clopidogrel  (PLAVIX ) 75 MG tablet [id number] Yes Take 1 tablet (75 mg total) by mouth daily. Ladona Heinz, MD  Active Spouse/Significant Other, Self  cromolyn  (OPTICROM ) 4 % ophthalmic solution [id number] Yes Place 1 drop into both eyes every Wednesday. At bedtime [provider]  Active Spouse/Significant Other, Self           Med Note ROLENE STALLION, SUSAN A   Thu Jan 06, 2024  1:22 PM)    diphenhydrAMINE  (BENADRYL ) 25 mg capsule [id number] Yes Take 1 capsule (25 mg total) by mouth every 6 (six) hours as needed for itching. Danton Reyes DASEN, MD  Active Self, Spouse/Significant Other  docusate sodium  (COLACE) 100 MG capsule [id number] Yes Take 1 capsule (100 mg total) by mouth 2 (two) times daily. Paola Dreama SAILOR, MD  Active Self, Spouse/Significant Other  ferrous sulfate  325 (65 FE) MG tablet [id number] Yes Take 325 mg by mouth daily with breakfast. [provider]  Active Spouse/Significant Other, Self           Med Note (CRUZ, RICARDO K   Wed Apr 24, 2020  2:09 PM)    isosorbide  dinitrate (ISORDIL ) 30 MG tablet [id number] Yes Take 30 mg by mouth 3 (three) times daily. [provider]  Active Self, Spouse/Significant Other  metolazone  (ZAROXOLYN ) 5 MG tablet [id number] Yes Take 5 mg by mouth 3 (three) times a week. [provider]  Active Self, Spouse/Significant Other  midodrine  (PROAMATINE ) 10 MG tablet [id number] Yes Take 2 tablets (20 mg total) by mouth 3 (three) times daily with meals. Danton Reyes DASEN, MD  Active Self, Spouse/Significant Other  multivitamin (RENA-VIT) TABS tablet [id number] Yes Take 1 tablet by mouth at bedtime. Danton Reyes DASEN, MD  Active Self, Spouse/Significant  Other  NOVOLOG  FLEXPEN 100 UNIT/ML FlexPen [id number] Yes Inject into the skin as directed. PRN [provider]  Active Self, Spouse/Significant Other  oxyCODONE  (OXY IR/ROXICODONE ) 5 MG immediate release tablet [id number]  Take 5 mg by mouth every 4 (four) hours as needed for moderate pain (pain score 4-6).  Patient not taking: Reported on 01/10/2024   [provider]  Active Self, Spouse/Significant Other  pantoprazole  (PROTONIX ) 40 MG tablet [id number]  Take 40 mg by mouth daily.  Patient not taking: Reported on 01/10/2024   [provider]  Active Self, Spouse/Significant Other  potassium chloride  (KLOR-CON ) 10 MEQ tablet [id number] Yes Take 10 mEq by mouth 3 (three) times daily. [provider]  Active Self, Spouse/Significant Other  senna-docusate (SENOKOT-S) 8.6-50 MG tablet [id number] Yes Take 1 tablet by mouth 2 (two) times daily. Danton Reyes DASEN, MD  Active Self, Spouse/Significant Other            Home Care and Equipment/Supplies: Were Home Health Services Ordered?: NA Any new equipment or medical supplies ordered?: NA  Functional Questionnaire: Do you need assistance with bathing/showering or dressing?: No Do you need assistance with meal preparation?: No Do you need assistance with eating?: No Do you have difficulty maintaining continence: No Do you need assistance with getting out of bed/getting out of a chair/moving?: No Do you have difficulty managing or taking your medications?: No  Follow up appointments reviewed: PCP Follow-up appointment confirmed?: Yes Date of PCP follow-up appointment?: 01/11/24 Follow-up Provider: [facility] Follow-up appointment confirmed?: NA Do you need transportation to your follow-up appointment?: No Do you understand care options if your condition(s) worsen?: Yes-patient verbalized understanding    SIGNATURE Julian Lemmings, LPN [facility] Nurse Health Advisor Direct Dial  [phone]

## 2024-01-11 ENCOUNTER — Ambulatory Visit (INDEPENDENT_AMBULATORY_CARE_PROVIDER_SITE_OTHER): Admitting: Internal Medicine

## 2024-01-11 ENCOUNTER — Encounter: Payer: Self-pay | Admitting: Internal Medicine

## 2024-01-11 VITALS — BP 112/80 | HR 127 | Temp 98.1°F | Ht 69.0 in

## 2024-01-11 DIAGNOSIS — E440 Moderate protein-calorie malnutrition: Secondary | ICD-10-CM

## 2024-01-11 DIAGNOSIS — J683 Other acute and subacute respiratory conditions due to chemicals, gases, fumes and vapors: Secondary | ICD-10-CM

## 2024-01-11 DIAGNOSIS — E1169 Type 2 diabetes mellitus with other specified complication: Secondary | ICD-10-CM

## 2024-01-11 DIAGNOSIS — N186 End stage renal disease: Secondary | ICD-10-CM

## 2024-01-11 DIAGNOSIS — R0789 Other chest pain: Secondary | ICD-10-CM

## 2024-01-11 DIAGNOSIS — M79671 Pain in right foot: Secondary | ICD-10-CM | POA: Diagnosis not present

## 2024-01-11 DIAGNOSIS — M7918 Myalgia, other site: Secondary | ICD-10-CM

## 2024-01-11 MED ORDER — LIDOCAINE 5 % EX PTCH: 1.0000 | MEDICATED_PATCH | CUTANEOUS | 0 refills | Status: DC

## 2024-01-11 MED ORDER — MIDODRINE HCL 10 MG PO TABS: 20.0000 mg | ORAL_TABLET | Freq: Three times a day (TID) | ORAL | 5 refills | Status: DC

## 2024-01-11 MED ORDER — BUDESONIDE-FORMOTEROL FUMARATE 160-4.5 MCG/ACT IN AERO
INHALATION_SPRAY | RESPIRATORY_TRACT | 6 refills | Status: DC
Start: 2024-01-11 — End: 2024-02-01

## 2024-01-11 NOTE — Assessment & Plan Note (Signed)
 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

## 2024-01-11 NOTE — Progress Notes (Deleted)
 Cardiology Clinic Note   Patient Name: Christian [name] Date of Encounter: 01/11/2024  Primary Care Provider:  Rollene Almarie LABOR, MD Primary Cardiologist:  Gordy Bergamo, MD  Patient Profile    ***  Past Medical History    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   Diabetic polyneuropathy    Elevated liver enzymes 08/09/2019   Hiatal hernia    History of coronary artery stent placement    History of pulmonary embolism 08/09/2019   HTN (hypertension) 11/12/2009   Hyperlipidemia associated with type 2 diabetes mellitus 11/12/2009   Left leg weakness 11/23/2018   Mild dementia, 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 LIMA TO LAD, RIMA TO PDA, RADIAL ARTERY TO CIRC AND SEQUENTIALLY TO OM1.   Sleep difficulties 05/30/2020   Type II diabetes mellitus 10/10/2014   Past Surgical History:  Procedure Laterality Date   A/V FISTULAGRAM Left 08/12/2022   Procedure: A/V Fistulagram;  Surgeon: Lanis Fonda BRAVO, MD;  Location: [facility] INVASIVE CV LAB;  Service: Cardiovascular;  Laterality: Left;   A/V FISTULAGRAM N/A 09/07/2023   Procedure: A/V Fistulagram;  Surgeon: Serene Gaile ORN, MD;  Location: [facility] INVASIVE CV LAB;  Service: Cardiovascular;  Laterality: N/A;   APPLICATION OF WOUND VAC N/A 08/24/2019   Procedure: APPLICATION OF WOUND VAC;  Surgeon: German Bartlett PEDLAR, MD;  Location: [facility] OR;  Service: Thoracic;  Laterality: N/A;   APPLICATION OF WOUND VAC  08/29/2019   Procedure: Wound Vac change;  Surgeon: German Bartlett PEDLAR, MD;  Location: [facility]  OR;  Service: Open Heart Surgery;;   APPLICATION OF WOUND VAC N/A 09/04/2019   Procedure: Application Of Wound Vac;  Surgeon: Army Dallas NOVAK, MD;  Location: [facility] OR;  Service: Open Heart Surgery;  Laterality: N/A;   APPLICATION OF WOUND VAC N/A 09/06/2019   Procedure: APPLICATION OF ACELL, APPLICATION OF WOUND VAC USING PREVENA INCISIONAL  DRESSING;  Surgeon: Lowery Estefana GORMAN, DO;  Location: [facility] OR;  Service: Plastics;  Laterality: N/A;   AV FISTULA PLACEMENT Left 09/18/2020   Procedure: ARTERIOVENOUS (AV) FISTULA CREATION LEFT VERSUS GRAFT;  Surgeon: Serene Gaile ORN, MD;  Location: [facility] OR;  Service: Vascular;  Laterality: Left;   BASCILIC VEIN TRANSPOSITION Left 10/31/2020   Procedure: LEFT SECOND STAGE BASCILIC VEIN TRANSPOSITION;  Surgeon: Serene Gaile ORN, MD;  Location: [facility] OR;  Service: Vascular;  Laterality: Left;   CARDIAC CATHETERIZATION     CORONARY ARTERY BYPASS GRAFT N/A 08/15/2019   Procedure: CORONARY ARTERY BYPASS GRAFTING (CABG), x 4 using bilateral IMAs and left radial artery .  LIMA TO LAD, RIMA TO PDA, RADIAL ARTERY TO CIRC AND SEQUENTIALLY TO OM1.;  Surgeon: German Bartlett PEDLAR, MD;  Location: [facility] OR;  Service: Open Heart Surgery;  Laterality: N/A;   CORONARY STENT PLACEMENT  02/27/2014   distal rt/pd coronary       dr bergamo   CYSTO/ BLADDER BIOPSY'S/ CAUTHERIZATION  01-14-2004  DR CHALES   EXPLORATION POST OPERATIVE OPEN HEART N/A 08/16/2019   Procedure: Chest Closure S?P CABG WITH APPLICATION OF  PREVENA  INCISIONAL WOUND VAC;  Surgeon: German Bartlett PEDLAR, MD;  [facility]: [facility] OR;  Service: Open Heart Surgery;  Laterality: N/A;   EXPLORATION POST OPERATIVE OPEN HEART N/A 08/21/2019   Procedure: CHEST WASHOUT S/P OPEN CHEST;  Surgeon: German Bartlett PEDLAR, MD;  [facility]: [facility] OR;  Service: Open Heart Surgery;  Laterality: N/A;  Open chest with Esmark dressing with Ioban sealant coverage.   EXPLORATION POST OPERATIVE OPEN HEART N/A 08/18/2019   Procedure: EXPLORATION POST OPERATIVE OPEN  HEART (performed 04/23 on unit);  Surgeon: German Bartlett PEDLAR, MD;  Location[facility] OR;  Service: Open Heart Surgery;  Laterality: N/A;   EXPLORATION POST OPERATIVE OPEN HEART N/A 08/24/2019   Procedure: CHEST WASHOUT POST OPERATIVE OPEN HEART;  Surgeon: German Bartlett PEDLAR, MD;  [facility];  Service: Open Heart Surgery;  Laterality: N/A;   EXPLORATION POST OPERATIVE OPEN HEART N/A 08/29/2019   Procedure: CHEST WOUND WASHOUT POST OPERATIVE OPEN HEART;  Surgeon: German Bartlett PEDLAR, MD;  [facility];  Service: Open Heart Surgery;  Laterality: N/A;   EXPLORATION POST OPERATIVE OPEN HEART N/A 09/04/2019   Procedure: MEDIASTINAL EXPLORATION WITH STERNAL WOUND IRRIGATION;  Surgeon: Army Dallas NOVAK, MD;  Location: [facility] OR;  Service: Open Heart Surgery;  Laterality: N/A;   EXPLORATION POST OPERATIVE OPEN HEART N/A 09/14/2019   Procedure: EVACUATION OF HEMATOMA;  Surgeon: Army Dallas NOVAK, MD;  Location: [facility] OR;  Service: Open Heart Surgery;  Laterality: N/A;   INSERTION OF DIALYSIS CATHETER N/A 09/16/2023   Procedure: INSERTION OF DIALYSIS CATHETER;  Surgeon: Gretta Lonni PARAS, MD;  [facility] OR;  Service: Vascular;  Laterality: N/A;  TUNNELED DIALYSIS CATHETER   IR FLUORO GUIDE CV LINE LEFT  09/19/2019   IR FLUORO GUIDE CV LINE RIGHT  09/08/2023   IR GASTROSTOMY TUBE MOD SED  10/04/2019   IR GASTROSTOMY TUBE REMOVAL  11/29/2019   IR PATIENT EVAL TECH 0-60 MINS  09/29/2019   IR US  GUIDE VASC ACCESS LEFT  09/19/2019   IR US  GUIDE VASC ACCESS RIGHT  09/08/2023   LAPAROSCOPIC APPENDECTOMY N/A 08/31/2023   Procedure: APPENDECTOMY, LAPAROSCOPIC;  Surgeon: Paola Dreama SAILOR, MD;  [facility] OR;  Service: General;  Laterality: N/A;   LAPAROSCOPIC LYSIS OF ADHESIONS N/A 09/06/2019   Procedure: LAPAROSCOPIC OMENTAL HARVEST;  Surgeon: Stevie Herlene Righter, MD;  Location: [facility] OR;  Service: General;  Laterality: N/A;   LEFT HEART CATH AND CORONARY ANGIOGRAPHY N/A 08/10/2019   Procedure: LEFT HEART CATH AND CORONARY  ANGIOGRAPHY;  Surgeon: Elmira Newman PARAS, MD;  [facility] INVASIVE CV LAB;  Service: Cardiovascular;  Laterality: N/A;   LEFT HEART CATHETERIZATION WITH CORONARY ANGIOGRAM N/A 02/27/2014   Procedure: LEFT HEART CATHETERIZATION WITH CORONARY ANGIOGRAM;  Surgeon: Erick JONELLE Bergamo, MD;  Location: [facility] CATH LAB;  Service: Cardiovascular;  Laterality: N/A;   MEDIASTINAL EXPLORATION N/A 09/06/2019   Procedure: MEDIASTINAL EXPLORATION;  Surgeon: Army Dallas NOVAK, MD;  Location: [facility] OR;  Service: Thoracic;  Laterality: N/A;   PECTORALIS FLAP  09/06/2019   Procedure: Pectoralis ADVANCEMENT Flap;  Surgeon: Lowery Estefana RAMAN, DO;  Location: [facility] OR;  Service: Plastics;;   PERCUTANEOUS CORONARY STENT INTERVENTION (PCI-S)  02/27/2014   Procedure: PERCUTANEOUS CORONARY STENT INTERVENTION (PCI-S);  Surgeon: Erick JONELLE Bergamo, MD;  Location: [facility] CATH LAB;  Service: Cardiovascular;;  rt PDA  3.0/28mm Promus stent   PERIPHERAL VASCULAR INTERVENTION Left 08/12/2022   Procedure: PERIPHERAL VASCULAR INTERVENTION;  Surgeon: Lanis Fonda BRAVO, MD;  Location: [facility] INVASIVE CV LAB;  Service:  Cardiovascular;  Laterality: Left;   RADIAL ARTERY HARVEST Left 08/15/2019   Procedure: Radial Artery Harvest;  Surgeon: German Bartlett PEDLAR, MD;  Location: [facility] OR;  Service: Open Heart Surgery;  Laterality: Left;   REMOVAL OF A HERO DEVICE Right 09/16/2023   Procedure: REMOVAL OF TEMPORARY TUNNELED HEMODIALYSIS CATHETER;  Surgeon: Gretta Lonni PARAS, MD;  Location: [facility] OR;  Service: Vascular;  Laterality: Right;   RIB PLATING N/A 09/06/2019   Procedure: STERNAL PLATING;  Surgeon: Army Dallas NOVAK, MD;  Location: [facility] OR;  Service: Thoracic;  Laterality: N/A;   TEE WITHOUT CARDIOVERSION N/A 08/15/2019   Procedure: TRANSESOPHAGEAL ECHOCARDIOGRAM (TEE);  Surgeon: German Bartlett PEDLAR, MD;  Location: [facility] OR;  Service: Open Heart Surgery;  Laterality: N/A;   TRACHEOSTOMY TUBE PLACEMENT  08/29/2019   Procedure: Tracheostomy;  Surgeon: German Bartlett PEDLAR,  MD;  [facility] OR;  Service: Open Heart Surgery; Decannulated 6/10/20211   TRANSURETHRAL RESECTION OF PROSTATE  04/04/2012   Procedure: TRANSURETHRAL RESECTION OF THE PROSTATE WITH GYRUS INSTRUMENTS;  Surgeon: Arlena LILLETTE Gal, MD;  Location[facility];  Service: Urology;  Laterality: N/A;   TRANSURETHRAL RESECTION OF PROSTATE N/A 09/27/2014   Procedure: TRANSURETHRAL RESECTION OF THE PROSTATE ;  Surgeon: Arlena Gal, MD;  Location: [facility] ORS;  Service: Urology;  Laterality: N/A;   TUNNELLED CATHETER EXCHANGE N/A 09/07/2023   Procedure: TUNNELLED CATHETER EXCHANGE;  Surgeon: Serene Gaile ORN, MD;  [facility] INVASIVE CV LAB;  Service: Cardiovascular;  Laterality: N/A;   ULTRASOUND GUIDANCE FOR VASCULAR ACCESS Right 09/16/2023   Procedure: ULTRASOUND GUIDANCE, FOR VASCULAR ACCESS;  Surgeon: Gretta Lonni PARAS, MD;  Location: [facility] OR;  Service: Vascular;  Laterality: Right;   UPPER GASTROINTESTINAL ENDOSCOPY      Allergies  No Known Allergies  History of Present Illness    ***  Home Medications    Prior to Admission medications   Medication Sig Start Date End Date Taking? Authorizing Provider  acetaminophen  (TYLENOL ) 325 MG tablet Take 2 tablets (650 mg total) by mouth every 6 (six) hours as needed for mild pain (pain score 1-3), fever or headache. 10/01/23   Danton Reyes DASEN, MD  atorvastatin  (LIPITOR) 20 MG tablet Take 1 tablet (20 mg total) by mouth every evening. 08/24/23   Ladona Heinz, MD  budesonide -formoterol  (SYMBICORT ) 160-4.5 MCG/ACT inhaler INHALE 2 PUFFS INTO THE LUNGS TWICE A DAY 12/09/22   Kara Dorn NOVAK, MD  calcitRIOL (ROCALTROL) 0.25 MCG capsule Take 0.25 mcg by mouth 3 (three) times a week. Take one capsule at dinner after on Mon-Wed-Fri. 07/21/23   [provider]  calcium  acetate (PHOSLO ) 667 MG capsule Take 1 capsule (667 mg total) by mouth 3 (three) times daily with meals. Patient not taking: Reported on 01/10/2024 10/01/23   Danton Reyes DASEN, MD  clopidogrel  (PLAVIX ) 75 MG tablet Take 1 tablet (75 mg total) by mouth daily. 06/24/23   Ladona Heinz, MD  cromolyn  (OPTICROM ) 4 % ophthalmic solution Place 1 drop into both eyes every Wednesday. At bedtime 12/08/19   [provider]  diphenhydrAMINE  (BENADRYL ) 25 mg capsule Take 1 capsule (25 mg total) by mouth every 6 (six) hours as needed for itching. 10/01/23   Danton Reyes DASEN, MD  docusate sodium  (COLACE) 100 MG capsule Take 1 capsule (100 mg total) by mouth 2 (two) times daily. 08/31/23 08/30/24  Paola Dreama SAILOR, MD  ferrous sulfate  325 (65 FE) MG tablet Take 325 mg by mouth daily with breakfast.    [provider]  isosorbide  dinitrate (ISORDIL ) 30 MG tablet Take 30 mg by mouth 3 (three) times daily. 11/13/23   [provider]  metolazone  (ZAROXOLYN ) 5 MG tablet Take 5 mg by mouth 3 (three) times a week. 11/13/23   [provider]  midodrine  (PROAMATINE ) 10 MG tablet Take 2 tablets (20 mg total) by mouth 3 (three) times daily with meals. 10/01/23   Danton Reyes DASEN, MD  multivitamin (RENA-VIT) TABS tablet Take 1 tablet by mouth at bedtime. 10/01/23   Danton Reyes DASEN, MD  NOVOLOG  FLEXPEN 100 UNIT/ML FlexPen Inject into the skin as directed. PRN    [provider]  oxyCODONE  (OXY IR/ROXICODONE ) 5 MG immediate release tablet Take 5 mg by mouth every 4 (four) hours as needed for moderate pain (pain score 4-6). Patient not taking: Reported on 01/10/2024 10/18/23   [provider]  pantoprazole  (PROTONIX ) 40 MG tablet Take 40 mg by mouth daily. Patient not taking: Reported on 01/10/2024 12/10/23   [provider]  potassium chloride  (KLOR-CON ) 10 MEQ tablet Take 10 mEq by mouth 3 (three) times daily. 10/06/23   [provider]  senna-docusate (SENOKOT-S) 8.6-50 MG tablet Take 1 tablet by mouth 2 (two) times daily. 10/01/23   Danton Reyes DASEN, MD    Family History    Family History  Problem Relation Age of Onset    Diabetes Mother    Hypertension Mother    Diabetes Father    Diabetes Brother    Hypertension Brother    Bone cancer Brother    Diabetes Brother    He indicated that his mother is deceased. He indicated that his father is deceased. He indicated that two of his three brothers are alive.  Social History    Social History   Socioeconomic History   Marital status: Married    Spouse name: Not on file   Number of children: 2   Years of education: 14   Highest education level: Associate degree: academic program  Occupational History   Occupation: Retired  Tobacco Use   Smoking status: Former    Current packs/day: 0.00    Average packs/day: 0.5 packs/day for 20.0 years (10.0 ttl pk-yrs)    Types: Cigarettes    Start date: 83    Quit date: 1975    Years since quitting: 50.7   Smokeless tobacco: Never  Vaping Use   Vaping status: Never Used  Substance and Sexual Activity   Alcohol use: Not Currently    Comment: very rarely - every now and then 1 drink/1 year if that   Drug use: No   Sexual activity: Not Currently  Other Topics Concern   Not on file  Social History Narrative   Right handed   Dirnks caffeine prn   Married   Lives with wife   Social Drivers of Health   Financial Resource Strain: Low Risk  (07/23/2021)   Overall Financial Resource Strain (CARDIA)    Difficulty of Paying Living Expenses: Not hard at all  Food Insecurity: No Food Insecurity (01/06/2024)   Hunger Vital Sign    Worried About Running Out of Food in the Last Year: Never true    Ran Out of Food in the Last Year: Never true  Transportation Needs: No Transportation Needs (01/06/2024)   PRAPARE - Administrator, Civil Service (Medical): No    Lack of Transportation (Non-Medical): No  Physical Activity: Inactive (07/23/2021)   Exercise Vital Sign    Days of Exercise per Week: 0  days    Minutes of Exercise per Session: 0 min  Stress: No Stress Concern Present (07/23/2021)   [employer] of Occupational Health - Occupational Stress Questionnaire    Feeling of Stress : Not at all  Social Connections: Socially Integrated (01/06/2024)   Social Connection and Isolation Panel    Frequency of Communication with Friends and Family: Once a week    Frequency of Social Gatherings with Friends and Family: More than three times a week    Attends Religious Services: 1 to 4 times per year    Active Member of [employer] or Organizations: Yes    Attends Banker Meetings: 1 to 4 times per year    Marital Status: Married  Catering manager Violence: Not At Risk (01/06/2024)   Humiliation, Afraid, Rape, and Kick questionnaire    Fear of Current or Ex-Partner: No    Emotionally Abused: No    Physically Abused: No    Sexually Abused: No     Review of Systems    General:  No chills, fever, night sweats or weight changes.  Cardiovascular:  No chest pain, dyspnea on exertion, edema, orthopnea, palpitations, paroxysmal nocturnal dyspnea. Dermatological: No rash, lesions/masses Respiratory: No cough, dyspnea Urologic: No hematuria, dysuria Abdominal:   No nausea, vomiting, diarrhea, bright red blood per rectum, melena, or hematemesis Neurologic:  No visual changes, wkns, changes in mental status. All other systems reviewed and are otherwise negative except as noted above.  Physical Exam    VS:  There were no vitals taken for this visit. , BMI There is no height or weight on file to calculate BMI. GEN: Well nourished, well developed, in no acute distress. HEENT: normal. Neck: Supple, no JVD, carotid bruits, or masses. Cardiac: RRR, no murmurs, rubs, or gallops. No clubbing, cyanosis, edema.  Radials/DP/PT 2+ and equal bilaterally.  Respiratory:  Respirations regular and unlabored, clear to auscultation bilaterally. GI: Soft, nontender, nondistended, BS + x 4. MS: no deformity or atrophy. Skin: warm and dry, no rash. Neuro:  Strength and sensation are intact. Psych:  Normal affect.  Accessory Clinical Findings    Recent Labs: 09/17/2023: TSH 1.378 09/29/2023: Magnesium  2.0 01/06/2024: ALT 19; Hemoglobin 11.7; Platelets 325 01/07/2024: BUN 5; Creatinine, Ser 3.13; Potassium 3.0; Sodium 133   Recent Lipid Panel    Component Value Date/Time   CHOL 151 12/14/2023 1543   TRIG 111.0 12/14/2023 1543   HDL 73.10 12/14/2023 1543   CHOLHDL 2 12/14/2023 1543   VLDL 22.2 12/14/2023 1543   LDLCALC 56 12/14/2023 1543   LDLDIRECT 40.0 11/23/2018 1120    No BP recorded.  {Refresh Note OR Click here to enter BP  :1}***    ECG personally reviewed by me today- ***          Assessment & Plan   1.  ***   Josefa HERO. Enza Shone NP-C     01/11/2024, 7:19 AM [facility] [address] [place], [place] [address] Office [phone]      I spent***minutes examining this patient, reviewing medications, and using patient centered shared decision making involving their cardiac care.   I spent  20 minutes reviewing past medical history,  medications, and prior cardiac tests.

## 2024-01-11 NOTE — Assessment & Plan Note (Signed)
 Chronic dry skin contributes to discomfort and potential complications. Encourage regular moisturizing of feet. Consider soaking feet to improve skin hydration. Potential ingrown nail and asked them to discuss with podiatry next week

## 2024-01-11 NOTE — Assessment & Plan Note (Signed)
 With stable complications and blood sugars controlled by dialysis.

## 2024-01-11 NOTE — Assessment & Plan Note (Signed)
 He is still struggling with oral intake and weight is not improving.

## 2024-01-11 NOTE — Progress Notes (Signed)
 Subjective:   Patient ID: Christian [name], male    DOB: 08-30-1945, 78 y.o.   MRN: [id number]  Discussed the use of AI scribe software for clinical note transcription with the patient, who gave verbal consent to proceed.  History of Present Illness Christian [name] is a 78 year old male who presents with chest pain and discomfort related to a dialysis catheter.  He experiences chest pain localized to the area of his dialysis catheter, accompanied by itching. The discomfort began after a recent hospitalization and has persisted. He has not undergone a heart catheterization or evaluation since his open heart surgery four years ago. He reports that a recent CT scan was performed to check for blood clots or pneumonia.  He reports significant pain in his buttocks due to prolonged sitting during dialysis sessions, exacerbated by insufficient padding.  He describes pain in his right big toe, which is tender to touch and sore even with the pressure of a sock. The pain is localized to the top of the toe, which appears red. Although he has not allowed his caregiver to examine it closely, his caregiver has noted some soreness and redness at the top of the toe.  He mentions soreness in his neck and has recently taken a tablet for the pain, though it has not yet provided relief. Additionally, he experiences soreness in his feet, which are dry.     Review of Systems  Constitutional:  Positive for activity change, appetite change and fatigue.  HENT: Negative.    Eyes: Negative.   Respiratory:  Negative for cough, chest tightness and shortness of breath.   Cardiovascular:  Positive for chest pain. Negative for palpitations and leg swelling.  Gastrointestinal:  Negative for abdominal distention, abdominal pain, constipation, diarrhea, nausea and vomiting.  Musculoskeletal:  Positive for arthralgias, back pain, gait problem and myalgias.  Neurological:  Positive for weakness.  Psychiatric/Behavioral:   Positive for sleep disturbance.     Objective:  Physical Exam Constitutional:      Appearance: He is well-developed.  HENT:     Head: Normocephalic and atraumatic.  Cardiovascular:     Rate and Rhythm: Normal rate and regular rhythm.  Pulmonary:     Effort: Pulmonary effort is normal. No respiratory distress.     Breath sounds: Normal breath sounds. No wheezing or rales.  Abdominal:     General: Bowel sounds are normal. There is no distension.     Palpations: Abdomen is soft.     Tenderness: There is no abdominal tenderness.  Musculoskeletal:        General: Tenderness present.     Cervical back: Normal range of motion.  Skin:    General: Skin is warm and dry.     Comments: Foot examined without injury or wound, some potential for ingrowing nail right great   Neurological:     Mental Status: He is alert and oriented to person, place, and time.     Motor: Weakness present.     Coordination: Coordination abnormal.     Vitals:   01/11/24 1533  BP: 112/80  Pulse: (!) 127  Temp: 98.1 F (36.7 C)  TempSrc: Oral  SpO2: 98%  Height: 5' 9 (1.753 m)    Assessment and Plan Assessment & Plan End stage renal disease on hemodialysis with pruritus and pain at dialysis catheter site   Chronic pruritus and pain are likely due to catheter irritation, common in end stage renal disease.  Type 2 diabetes mellitus with  diabetic chronic kidney disease   This is a chronic condition with ongoing management.  Protein-calorie malnutrition and weight loss   This is a chronic condition.  Pain in buttocks due to loss of fat padding and prolonged sitting   Chronic pain results from loss of fat padding and prolonged sitting during dialysis. Prescribe lidocaine  patches for pain relief in the buttocks area.  Neck pain   Acute neck pain is managed with oral medication. Prescribe lidocaine  patches for additional pain relief in the neck area.  Impaired mobility and need for home health  services   There is an ongoing need for home health services due to impaired mobility post-hospital discharge. Ensure home health services provide necessary equipment and support. Verify if additional equipment is needed, such as a bed that lowers to the floor.  Dry skin of feet /right foot pain  Chronic dry skin contributes to discomfort and potential complications. Encourage regular moisturizing of feet. Consider soaking feet to improve skin hydration.

## 2024-01-11 NOTE — Patient Instructions (Addendum)
 We have sent in lidocaine  patches for the backside to try for pain.  We will leave the other medicines the same.

## 2024-01-11 NOTE — Assessment & Plan Note (Signed)
 He is having intermittent pain due to dialysis catheter and continue to monitor.

## 2024-01-11 NOTE — Assessment & Plan Note (Signed)
 Chronic pain results from loss of fat padding and prolonged sitting during dialysis. Prescribe lidocaine  patches for pain relief in the buttocks area.

## 2024-01-13 NOTE — Telephone Encounter (Signed)
 Copied from CRM [id number]. Topic: Clinical - Medication Question >> Jan 13, 2024 10:03 AM [place] J wrote: Reason for CRM: Patient's wife is needing clarification on two medications (midodrine  (PROAMATINE ) 10 MG tablet & pantoprazole  sod 40 MG) that she said the pharmacy refilled and added additional dosing/instructions to. She is needing clarification of what to do with the medications prescribed.   Please call: [phone]

## 2024-01-14 NOTE — Telephone Encounter (Signed)
 Called patient wife and clarified with her that provider has stopped the pantoprazole  he no longer needs to take this one and explain how to take the midodrine 

## 2024-01-18 ENCOUNTER — Ambulatory Visit: Admitting: General Practice

## 2024-01-19 NOTE — Progress Notes (Signed)
 Cardiology Office Note    Patient Name: Christian [name] Date of Encounter: 01/19/2024  Primary Care Provider:  Rollene Almarie LABOR, MD Primary Cardiologist:  Gordy Bergamo, MD Primary Electrophysiologist: None   Past Medical History    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   Diabetic polyneuropathy    Elevated liver enzymes 08/09/2019   Hiatal hernia    History of coronary artery stent placement    History of pulmonary embolism 08/09/2019   HTN (hypertension) 11/12/2009   Hyperlipidemia associated with type 2 diabetes mellitus 11/12/2009   Left leg weakness 11/23/2018   Mild dementia, 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 LIMA TO LAD, RIMA TO PDA, RADIAL ARTERY TO CIRC AND SEQUENTIALLY TO OM1.   Sleep difficulties 05/30/2020   Type II diabetes mellitus 10/10/2014    History of Present Illness  Christian [name] is a 78 y.o. male with a PMH of CAD s/p LHC with DES/PCI to PDA in 2015 and NSTEMI 07/2019 with CABG x 4 and cardiac arrest due to tamponade, ESRD, HLD, HTN, PE, right CVA HFrEF/ICM, multiple sclerosis who presents today for hospital follow-up.  Christian [name] has been followed by Dr. Bergamo since 2015 he underwent LHC for complaint of chest pain and had PCI/DES to PDA.  A Lexiscan  Myoview  in 2017.  He presented to the ED with NSTEMI in 2021 LHC that showed a 5% ostial LAD 40% calcific proximal RCA and 70% in-stent restenosis of the distal RCA.  He underwent a CABG x 4 with (LIMA-LAD, RIMA to PDA, Lt radial artery graft to 1st OM  and distal OM branches of LCx).  He was unable to close his sternal incision due to edema and patient was started on Lasix  drip to facilitate diuresis.   He required 2 units of PRBC due to blood loss anemia.  He was taken back to the OR for sternal closure and application of wound VAC.  Was weaned off of pressors and developed atrial flutter treated with IV amiodarone .  He became very hypotensive and required bedside cardioversion with restoration to sinus rhythm.  He was evaluated by the advanced heart failure team for volume management and systolic CHF.  Later that afternoon he suffered a cardiac arrest with CPR performed and reintubation and emergent opening of the chest in the ICU terminal carotid massage and removal of large clots and improvement of hemodynamics.  He developed AKI and creatinine peaked to 5.2 he also developed cardiogenic shock and was initiated on CRRT with a subsequent sternal washout with placement of wound VAC.  He was discharged on 10/18/2019 sent to [facility] health care center.  He presented to the ED on 01/06/2024 with complaint of chest pain.  Pain started on the left side of the chest and going towards the right.  ECG in the ED was notable for tachycardia secondary to CTA for PE.  2D echo was completed in 08/2023 with significant LVH and global preserved EF with aortic valve calcification.  EKG showed new T wave changes in inferior lateral and anterior  lateral leads with troponin slightly elevated.  He was evaluated by cardiology with determination of chest pain noncardiac and patient was symptom-free following day.   Patient denies chest pain, palpitations, dyspnea, PND, orthopnea, nausea, vomiting, dizziness, syncope, edema, weight gain, or early satiety.   Discussed the use of AI scribe software for clinical note transcription with the patient, who gave verbal consent to proceed.  History of Present Illness    ***Notes:   Review of Systems  Please see the history of  present illness.    All other systems reviewed and are otherwise negative except as noted above.  Physical Exam    Wt Readings from Last 3 Encounters:  01/07/24 136 lb 7.4 oz (61.9 kg)  10/01/23 171 lb 15.3 oz (78 kg)  08/04/23 206 lb (93.4 kg)   CD:Uyzmz were no vitals filed for this visit.,There is no height or weight on file to calculate BMI. GEN: Well nourished, well developed in no acute distress Neck: No JVD; No carotid bruits Pulmonary: Clear to auscultation without rales, wheezing or rhonchi  Cardiovascular: Normal rate. Regular rhythm. Normal S1. Normal S2.   Murmurs: There is no murmur.  ABDOMEN: Soft, non-tender, non-distended EXTREMITIES:  No edema; No deformity   EKG/LABS/ Recent Cardiac Studies   ECG personally reviewed by me today - ***  Risk Assessment/Calculations:   {Does this patient have ATRIAL FIBRILLATION?[phone]}      Lab Results  Component Value Date   WBC 7.8 01/06/2024   HGB 11.7 (L) 01/06/2024   HCT 37.5 (L) 01/06/2024   MCV 102.5 (H) 01/06/2024   PLT 325 01/06/2024   Lab Results  Component Value Date   CREATININE 3.13 (H) 01/07/2024   BUN 5 (L) 01/07/2024   NA 133 (L) 01/07/2024   K 3.0 (L) 01/07/2024   CL 96 (L) 01/07/2024   CO2 27 01/07/2024   Lab Results  Component Value Date   CHOL 151 12/14/2023   HDL 73.10 12/14/2023   LDLCALC 56 12/14/2023   LDLDIRECT 40.0 11/23/2018   TRIG 111.0 12/14/2023   CHOLHDL 2 12/14/2023    Lab Results  Component Value Date   HGBA1C 4.8 12/14/2023   Assessment & Plan    Assessment and Plan Assessment & Plan     1.  Coronary artery disease  2.  HFrEF  3.  Essential hypertension  4.  ESRD  5.  Chest pain      Disposition: Follow-up with Gordy Bergamo, MD or APP in *** months {Are you ordering a CV Procedure (e.g. stress test, cath, DCCV, TEE, etc)?   Press F2        :[id number]}   Signed, Wyn Raddle, Jackee Shove, NP 01/19/2024, 7:09 PM [facility]

## 2024-01-20 ENCOUNTER — Ambulatory Visit: Attending: Nurse Practitioner | Admitting: Nurse Practitioner

## 2024-01-20 ENCOUNTER — Other Ambulatory Visit: Payer: Self-pay

## 2024-01-20 ENCOUNTER — Emergency Department (HOSPITAL_COMMUNITY)

## 2024-01-20 ENCOUNTER — Encounter: Payer: Self-pay | Admitting: Nurse Practitioner

## 2024-01-20 ENCOUNTER — Encounter (HOSPITAL_COMMUNITY): Payer: Self-pay

## 2024-01-20 ENCOUNTER — Observation Stay (HOSPITAL_COMMUNITY)
Admission: EM | Admit: 2024-01-20 | Discharge: 2024-01-21 | Disposition: A | Discharge status: Home / Self Care | Attending: Internal Medicine | Admitting: Internal Medicine

## 2024-01-20 VITALS — BP 100/70 | HR 126 | Ht 69.0 in

## 2024-01-20 DIAGNOSIS — R0789 Other chest pain: Secondary | ICD-10-CM | POA: Diagnosis present

## 2024-01-20 DIAGNOSIS — N186 End stage renal disease: Secondary | ICD-10-CM | POA: Diagnosis not present

## 2024-01-20 DIAGNOSIS — Z951 Presence of aortocoronary bypass graft: Principal | ICD-10-CM

## 2024-01-20 DIAGNOSIS — I4892 Unspecified atrial flutter: Secondary | ICD-10-CM | POA: Diagnosis present

## 2024-01-20 DIAGNOSIS — J90 Pleural effusion, not elsewhere classified: Secondary | ICD-10-CM | POA: Insufficient documentation

## 2024-01-20 DIAGNOSIS — I25118 Atherosclerotic heart disease of native coronary artery with other forms of angina pectoris: Secondary | ICD-10-CM | POA: Diagnosis not present

## 2024-01-20 DIAGNOSIS — I10 Essential (primary) hypertension: Secondary | ICD-10-CM | POA: Diagnosis present

## 2024-01-20 DIAGNOSIS — E669 Obesity, unspecified: Secondary | ICD-10-CM | POA: Diagnosis not present

## 2024-01-20 DIAGNOSIS — G35 Multiple sclerosis: Secondary | ICD-10-CM | POA: Insufficient documentation

## 2024-01-20 DIAGNOSIS — R079 Chest pain, unspecified: Secondary | ICD-10-CM | POA: Diagnosis not present

## 2024-01-20 DIAGNOSIS — J449 Chronic obstructive pulmonary disease, unspecified: Secondary | ICD-10-CM | POA: Diagnosis not present

## 2024-01-20 DIAGNOSIS — R918 Other nonspecific abnormal finding of lung field: Secondary | ICD-10-CM | POA: Diagnosis not present

## 2024-01-20 DIAGNOSIS — E1122 Type 2 diabetes mellitus with diabetic chronic kidney disease: Secondary | ICD-10-CM | POA: Diagnosis not present

## 2024-01-20 DIAGNOSIS — J929 Pleural plaque without asbestos: Secondary | ICD-10-CM | POA: Diagnosis not present

## 2024-01-20 DIAGNOSIS — I2510 Atherosclerotic heart disease of native coronary artery without angina pectoris: Secondary | ICD-10-CM | POA: Diagnosis not present

## 2024-01-20 DIAGNOSIS — R0600 Dyspnea, unspecified: Secondary | ICD-10-CM

## 2024-01-20 DIAGNOSIS — I5042 Chronic combined systolic (congestive) and diastolic (congestive) heart failure: Secondary | ICD-10-CM | POA: Diagnosis not present

## 2024-01-20 DIAGNOSIS — I132 Hypertensive heart and chronic kidney disease with heart failure and with stage 5 chronic kidney disease, or end stage renal disease: Secondary | ICD-10-CM | POA: Diagnosis not present

## 2024-01-20 DIAGNOSIS — Z993 Dependence on wheelchair: Secondary | ICD-10-CM | POA: Insufficient documentation

## 2024-01-20 DIAGNOSIS — I4581 Long QT syndrome: Secondary | ICD-10-CM | POA: Diagnosis not present

## 2024-01-20 DIAGNOSIS — E1169 Type 2 diabetes mellitus with other specified complication: Secondary | ICD-10-CM | POA: Diagnosis present

## 2024-01-20 DIAGNOSIS — I5032 Chronic diastolic (congestive) heart failure: Secondary | ICD-10-CM | POA: Insufficient documentation

## 2024-01-20 DIAGNOSIS — E7800 Pure hypercholesterolemia, unspecified: Secondary | ICD-10-CM | POA: Diagnosis not present

## 2024-01-20 DIAGNOSIS — R000 Tachycardia, unspecified: Secondary | ICD-10-CM

## 2024-01-20 DIAGNOSIS — E785 Hyperlipidemia, unspecified: Secondary | ICD-10-CM | POA: Insufficient documentation

## 2024-01-20 DIAGNOSIS — Z743 Need for continuous supervision: Secondary | ICD-10-CM | POA: Diagnosis not present

## 2024-01-20 DIAGNOSIS — Z452 Encounter for adjustment and management of vascular access device: Secondary | ICD-10-CM | POA: Diagnosis not present

## 2024-01-20 DIAGNOSIS — R0602 Shortness of breath: Secondary | ICD-10-CM | POA: Diagnosis not present

## 2024-01-20 DIAGNOSIS — I484 Atypical atrial flutter: Principal | ICD-10-CM | POA: Insufficient documentation

## 2024-01-20 DIAGNOSIS — Z6820 Body mass index (BMI) 20.0-20.9, adult: Secondary | ICD-10-CM | POA: Insufficient documentation

## 2024-01-20 DIAGNOSIS — L89322 Pressure ulcer of left buttock, stage 2: Secondary | ICD-10-CM | POA: Insufficient documentation

## 2024-01-20 DIAGNOSIS — N184 Chronic kidney disease, stage 4 (severe): Secondary | ICD-10-CM | POA: Diagnosis not present

## 2024-01-20 DIAGNOSIS — E639 Nutritional deficiency, unspecified: Secondary | ICD-10-CM | POA: Diagnosis not present

## 2024-01-20 DIAGNOSIS — E119 Type 2 diabetes mellitus without complications: Secondary | ICD-10-CM

## 2024-01-20 DIAGNOSIS — Z992 Dependence on renal dialysis: Secondary | ICD-10-CM | POA: Diagnosis not present

## 2024-01-20 DIAGNOSIS — E1142 Type 2 diabetes mellitus with diabetic polyneuropathy: Secondary | ICD-10-CM | POA: Diagnosis not present

## 2024-01-20 LAB — BRAIN NATRIURETIC PEPTIDE: B Natriuretic Peptide: 91.5 pg/mL (ref 0.0–100.0)

## 2024-01-20 LAB — BASIC METABOLIC PANEL WITH GFR
Anion gap: 17 — ABNORMAL HIGH (ref 5–15)
BUN: 8 mg/dL (ref 8–23)
CO2: 26 mmol/L (ref 22–32)
Calcium: 9.1 mg/dL (ref 8.9–10.3)
Chloride: 94 mmol/L — ABNORMAL LOW (ref 98–111)
Creatinine, Ser: 2.79 mg/dL — ABNORMAL HIGH (ref 0.61–1.24)
GFR, Estimated: 23 mL/min — ABNORMAL LOW (ref >60)
Glucose, Bld: 80 mg/dL (ref 70–99)
Potassium: 3.5 mmol/L (ref 3.5–5.1)
Sodium: 137 mmol/L (ref 135–145)

## 2024-01-20 LAB — TROPONIN I (HIGH SENSITIVITY)
Troponin I (High Sensitivity): 31 ng/L — ABNORMAL HIGH (ref <18)
Troponin I (High Sensitivity): 29 ng/L — ABNORMAL HIGH (ref <18)

## 2024-01-20 LAB — CBC
HCT: 47.4 % (ref 39.0–52.0)
Hemoglobin: 15.3 g/dL (ref 13.0–17.0)
MCH: 32.2 pg (ref 26.0–34.0)
MCHC: 32.3 g/dL (ref 30.0–36.0)
MCV: 99.8 fL (ref 80.0–100.0)
Platelets: 275 K/uL (ref 150–400)
RBC: 4.75 MIL/uL (ref 4.22–5.81)
RDW: 15.7 % — ABNORMAL HIGH (ref 11.5–15.5)
WBC: 7.9 K/uL (ref 4.0–10.5)
nRBC: 0 % (ref 0.0–0.2)

## 2024-01-20 LAB — PROTIME-INR
INR: 0.9 (ref 0.8–1.2)
Prothrombin Time: 13 s (ref 11.4–15.2)

## 2024-01-20 LAB — MAGNESIUM: Magnesium: 2.1 mg/dL (ref 1.7–2.4)

## 2024-01-20 LAB — D-DIMER, QUANTITATIVE: D-Dimer, Quant: 1.97 ug{FEU}/mL — ABNORMAL HIGH (ref 0.00–0.50)

## 2024-01-20 MED ORDER — SODIUM CHLORIDE 0.9 % IV BOLUS
500.0000 mL | Freq: Once | INTRAVENOUS | Status: AC
  Administered 2024-01-20: 500 mL via INTRAVENOUS

## 2024-01-20 MED ORDER — IOHEXOL 350 MG/ML SOLN
65.0000 mL | Freq: Once | INTRAVENOUS | Status: AC | PRN
  Administered 2024-01-20: 65 mL via INTRAVENOUS

## 2024-01-20 MED ORDER — MORPHINE SULFATE (PF) 2 MG/ML IV SOLN
2.0000 mg | Freq: Once | INTRAVENOUS | Status: AC
  Administered 2024-01-20: 2 mg via INTRAVENOUS
  Filled 2024-01-20: qty 1

## 2024-01-20 NOTE — ED Triage Notes (Signed)
 PT BIB EMS from cardiology office. With complaints of SOB. Pt was in office for follow up appointment. Pt was tachy and SOB while in office and advised to come to ED for further evaluation. Pt has had decreased appetitie. Pt denies any pain   EMS vital   115 HR 132/88 95% ra 18 RR

## 2024-01-20 NOTE — ED Provider Notes (Signed)
 [place] EMERGENCY DEPARTMENT AT [facility] Provider Note   CSN: [id number] Arrival date & time: 01/20/24  1544     Patient presents with: Shortness of Breath   Christian [name] is a 78 y.o. male.  With an extensive cardiac history including status post four-vessel CABG, heart failure, hypertension, NSTEMI, PE who presents to the ED for shortness of breath.  Patient was seen earlier today in his cardiology office and directed here for further evaluation given reported shortness of breath and left-sided chest pain along with tachycardia.  It was suspected the symptoms may be due to malnutrition but considering his extensive history cardiology team wished for him to be evaluated today.  Patient himself reports that his symptoms have resolved including shortness of breath and chest pain.  He feels as though he just needed to stretch out.  No nausea vomiting diarrhea fevers chills recent illness.  {Add pertinent medical, surgical, social history, OB history to HPI:32947}  Shortness of Breath      Prior to Admission medications   Medication Sig Start Date End Date Taking? Authorizing Provider  acetaminophen  (TYLENOL ) 325 MG tablet Take 2 tablets (650 mg total) by mouth every 6 (six) hours as needed for mild pain (pain score 1-3), fever or headache. 10/01/23   Danton Reyes DASEN, MD  atorvastatin  (LIPITOR) 20 MG tablet Take 1 tablet (20 mg total) by mouth every evening. 08/24/23   Ladona Heinz, MD  budesonide -formoterol  (SYMBICORT ) 160-4.5 MCG/ACT inhaler INHALE 2 PUFFS INTO THE LUNGS TWICE A DAY 01/11/24   Rollene Almarie LABOR, MD  calcitRIOL  (ROCALTROL ) 0.25 MCG capsule Take 0.25 mcg by mouth 3 (three) times a week. Take one capsule at dinner after on Mon-Wed-Fri. 07/21/23   [provider]  clopidogrel  (PLAVIX ) 75 MG tablet Take 1 tablet (75 mg total) by mouth daily. 06/24/23   Ladona Heinz, MD  diphenhydrAMINE  (BENADRYL ) 25 mg capsule Take 1 capsule (25 mg total) by mouth every  6 (six) hours as needed for itching. 10/01/23   Danton Reyes DASEN, MD  docusate sodium  (COLACE) 100 MG capsule Take 1 capsule (100 mg total) by mouth 2 (two) times daily. 08/31/23 08/30/24  Paola Dreama SAILOR, MD  ferrous sulfate  325 (65 FE) MG tablet Take 325 mg by mouth daily with breakfast.    [provider]  isosorbide  dinitrate (ISORDIL ) 30 MG tablet Take 30 mg by mouth 3 (three) times daily. 11/13/23   [provider]  lidocaine  (LIDODERM ) 5 % Place 1 patch onto the skin daily. Remove & Discard patch within 12 hours or as directed by MD 01/11/24   Rollene Almarie LABOR, MD  midodrine  (PROAMATINE ) 10 MG tablet Take 2 tablets (20 mg total) by mouth 3 (three) times daily with meals. 01/11/24   Rollene Almarie LABOR, MD  multivitamin (RENA-VIT) TABS tablet Take 1 tablet by mouth at bedtime. 10/01/23   Danton Reyes DASEN, MD  senna-docusate (SENOKOT-S) 8.6-50 MG tablet Take 1 tablet by mouth 2 (two) times daily. 10/01/23   Danton Reyes DASEN, MD    Allergies: Patient has no known allergies.    Review of Systems  Respiratory:  Positive for shortness of breath.     Updated Vital Signs BP 134/83   Pulse (!) 102   Temp (!) 97.3 F (36.3 C) (Oral)   Resp 15   SpO2 100%   Physical Exam Vitals and nursing note reviewed.  HENT:     Head: Normocephalic and atraumatic.  Eyes:     Pupils:  Pupils are equal, round, and reactive to light.  Cardiovascular:     Rate and Rhythm: Regular rhythm. Tachycardia present.  Pulmonary:     Effort: Pulmonary effort is normal.     Breath sounds: Normal breath sounds. No wheezing or rales.  Chest:     Comments: Well-healed midline sternotomy scar Abdominal:     Palpations: Abdomen is soft.     Tenderness: There is no abdominal tenderness.  Skin:    General: Skin is warm and dry.  Neurological:     Mental Status: He is alert.  Psychiatric:        Mood and Affect: Mood normal.     (all labs ordered are listed, but only abnormal results are  displayed) Labs Reviewed  CBC - Abnormal; Notable for the following components:      Result Value   RDW 15.7 (*)    All other components within normal limits  BRAIN NATRIURETIC PEPTIDE  PROTIME-INR  BASIC METABOLIC PANEL WITH GFR  MAGNESIUM   D-DIMER, QUANTITATIVE  TROPONIN I (HIGH SENSITIVITY)    EKG: None  Radiology: DG Chest 2 View Result Date: 01/20/2024 CLINICAL DATA:  Shortness of breath EXAM: CHEST - 2 VIEW COMPARISON:  Chest x-ray 01/06/2024.  CT of the chest 01/06/2024. FINDINGS: Right-sided central venous catheter tip ends in the distal SVC. Sternotomy wires and fixation devices are present. The cardiomediastinal silhouette is within normal limits. Small left pleural effusion is seen, possibly partially loculated posteriorly. Right lung appears clear. Calcified pleural plaques are again seen in the upper lungs. No pneumothorax or acute fracture. Degenerative changes affect the shoulders. IMPRESSION: 1. Small left pleural effusion, possibly partially loculated posteriorly. 2. 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 Electronically Signed   By: Greig Pique M.D.   On: 01/20/2024 17:46    {Document cardiac monitor, telemetry assessment procedure when appropriate:32947} .Ultrasound ED Peripheral IV (Provider)  Date/Time: 01/20/2024 8:12 PM  Performed by: Pamella Ozell LABOR, DO Authorized by: Pamella Ozell LABOR, DO   Procedure details:    Indications: multiple failed IV attempts and poor IV access     Skin Prep: chlorhexidine  gluconate     Location:  Right AC   Angiocath:  20 G   Bedside Ultrasound Guided: Yes     Images: not archived     Patient tolerated procedure without complications: Yes     Dressing applied: Yes   Ultrasound ED Echo  Date/Time: 01/20/2024 8:14 PM  Performed by: Pamella Ozell LABOR, DO Authorized by: Pamella Ozell LABOR, DO   Procedure details:    Indications: chest pain     Views: subxiphoid, parasternal long axis view, parasternal short axis view,  apical 4 chamber view and IVC view     Images: archived   Findings:    Pericardium: no pericardial effusion     LV Function: severly depressed (<30%)     RV Diameter: normal     IVC: normal   Impression:    Impression: decreased contractility      Medications Ordered in the ED - No data to display  Clinical Course as of 01/20/24 2156  Thu Jan 20, 2024  2149 OK TO GIVE IV CONTRAST DESPITE GFR 23 [MP]    Clinical Course User Index [MP] Pamella Ozell LABOR, DO   {Click here for ABCD2, HEART and other calculators REFRESH Note before signing:1}  Medical Decision Making 78 year old male with history as above presented to the ED for chest pain shortness of breath.  Seen earlier today in the cardiology office.  Noted to be tachycardic left-sided chest pain.  Sent here for further evaluation considering extensive cardiac history.  Is tachycardic afebrile normotensive.  States his chest pain and shortness of breath have resolved.  Will obtain cardiac workup to evaluate for recurrence of ACS, dysrhythmia, heart failure exacerbation.  He does have a history of PE and is only on Plavix  now will evaluate for PE as well.  Admission likely.  Amount and/or Complexity of Data Reviewed Labs: ordered. Radiology: ordered.     {Document critical care time when appropriate  Document review of labs and clinical decision tools ie CHADS2VASC2, etc  Document your independent review of radiology images and any outside records  Document your discussion with family members, caretakers and with consultants  Document social determinants of health affecting pt's care  Document your decision making why or why not admission, treatments were needed:32947:::1}   Final diagnoses:  None    ED Discharge Orders     None

## 2024-01-20 NOTE — ED Notes (Addendum)
 EDP made aware of pts HR in 140s/150s with chest pain.

## 2024-01-20 NOTE — ED Notes (Addendum)
 Pts HR in 140s with chest pain. EKG captured and EDP made aware and at bedside.

## 2024-01-21 ENCOUNTER — Other Ambulatory Visit: Payer: Self-pay

## 2024-01-21 ENCOUNTER — Other Ambulatory Visit (HOSPITAL_COMMUNITY): Payer: Self-pay

## 2024-01-21 DIAGNOSIS — I4892 Unspecified atrial flutter: Secondary | ICD-10-CM

## 2024-01-21 DIAGNOSIS — R079 Chest pain, unspecified: Secondary | ICD-10-CM

## 2024-01-21 DIAGNOSIS — Z993 Dependence on wheelchair: Secondary | ICD-10-CM

## 2024-01-21 DIAGNOSIS — I2510 Atherosclerotic heart disease of native coronary artery without angina pectoris: Secondary | ICD-10-CM | POA: Diagnosis not present

## 2024-01-21 DIAGNOSIS — I10 Essential (primary) hypertension: Secondary | ICD-10-CM

## 2024-01-21 DIAGNOSIS — R000 Tachycardia, unspecified: Secondary | ICD-10-CM

## 2024-01-21 DIAGNOSIS — N186 End stage renal disease: Secondary | ICD-10-CM

## 2024-01-21 DIAGNOSIS — L89322 Pressure ulcer of left buttock, stage 2: Secondary | ICD-10-CM | POA: Diagnosis present

## 2024-01-21 DIAGNOSIS — Z992 Dependence on renal dialysis: Secondary | ICD-10-CM

## 2024-01-21 DIAGNOSIS — I484 Atypical atrial flutter: Secondary | ICD-10-CM | POA: Diagnosis not present

## 2024-01-21 DIAGNOSIS — R0789 Other chest pain: Secondary | ICD-10-CM

## 2024-01-21 LAB — COMPREHENSIVE METABOLIC PANEL WITH GFR
ALT: 15 U/L (ref 0–44)
AST: 28 U/L (ref 15–41)
Albumin: 1.9 g/dL — ABNORMAL LOW (ref 3.5–5.0)
Alkaline Phosphatase: 112 U/L (ref 38–126)
Anion gap: 14 (ref 5–15)
BUN: 9 mg/dL (ref 8–23)
CO2: 28 mmol/L (ref 22–32)
Calcium: 8.8 mg/dL — ABNORMAL LOW (ref 8.9–10.3)
Chloride: 96 mmol/L — ABNORMAL LOW (ref 98–111)
Creatinine, Ser: 3.13 mg/dL — ABNORMAL HIGH (ref 0.61–1.24)
GFR, Estimated: 20 mL/min — ABNORMAL LOW (ref >60)
Glucose, Bld: 69 mg/dL — ABNORMAL LOW (ref 70–99)
Potassium: 3.1 mmol/L — ABNORMAL LOW (ref 3.5–5.1)
Sodium: 138 mmol/L (ref 135–145)
Total Bilirubin: 1 mg/dL (ref 0.0–1.2)
Total Protein: 5.2 g/dL — ABNORMAL LOW (ref 6.5–8.1)

## 2024-01-21 LAB — GLUCOSE, CAPILLARY
Glucose-Capillary: 68 mg/dL — ABNORMAL LOW (ref 70–99)
Glucose-Capillary: 71 mg/dL (ref 70–99)

## 2024-01-21 LAB — CBG MONITORING, ED
Glucose-Capillary: 64 mg/dL — ABNORMAL LOW (ref 70–99)
Glucose-Capillary: 99 mg/dL (ref 70–99)

## 2024-01-21 LAB — TSH: TSH: 1.918 u[IU]/mL (ref 0.350–4.500)

## 2024-01-21 MED ORDER — HEPARIN SODIUM (PORCINE) 1000 UNIT/ML DIALYSIS: 1000.0000 [IU] | INTRAMUSCULAR | Status: DC | PRN

## 2024-01-21 MED ORDER — RENA-VITE PO TABS: 1.0000 | ORAL_TABLET | Freq: Every day | ORAL | Status: DC

## 2024-01-21 MED ORDER — LIDOCAINE-PRILOCAINE 2.5-2.5 % EX CREA: 1.0000 | TOPICAL_CREAM | CUTANEOUS | Status: DC | PRN

## 2024-01-21 MED ORDER — ANTICOAGULANT SODIUM CITRATE 4% (200MG/5ML) IV SOLN: 5.0000 mL | Status: DC | PRN

## 2024-01-21 MED ORDER — CHLORHEXIDINE GLUCONATE CLOTH 2 % EX PADS
6.0000 | MEDICATED_PAD | Freq: Every day | CUTANEOUS | Status: DC
Start: 2024-01-21 — End: 2024-01-21
  Administered 2024-01-21: 6 via TOPICAL

## 2024-01-21 MED ORDER — CALCITRIOL 0.25 MCG PO CAPS
0.2500 ug | ORAL_CAPSULE | ORAL | Status: DC
  Administered 2024-01-21: 0.25 ug via ORAL
  Filled 2024-01-21 (×2): qty 1

## 2024-01-21 MED ORDER — PENTAFLUOROPROP-TETRAFLUOROETH EX AERO: 1.0000 | INHALATION_SPRAY | CUTANEOUS | Status: DC | PRN

## 2024-01-21 MED ORDER — INSULIN ASPART 100 UNIT/ML IJ SOLN: 0.0000 [IU] | INTRAMUSCULAR | Status: DC

## 2024-01-21 MED ORDER — FLUTICASONE FUROATE-VILANTEROL 200-25 MCG/ACT IN AEPB
1.0000 | INHALATION_SPRAY | Freq: Every day | RESPIRATORY_TRACT | Status: DC
Start: 2024-01-21 — End: 2024-01-21
  Filled 2024-01-21: qty 28

## 2024-01-21 MED ORDER — HEPARIN SODIUM (PORCINE) 1000 UNIT/ML IJ SOLN
INTRAMUSCULAR | Status: AC
  Filled 2024-01-21: qty 4

## 2024-01-21 MED ORDER — DEXTROSE 50 % IV SOLN
12.5000 g | Freq: Once | INTRAVENOUS | Status: AC
  Administered 2024-01-21: 12.5 g via INTRAVENOUS

## 2024-01-21 MED ORDER — CLOPIDOGREL BISULFATE 75 MG PO TABS
75.0000 mg | ORAL_TABLET | Freq: Every day | ORAL | Status: DC
  Administered 2024-01-21: 75 mg via ORAL
  Filled 2024-01-21: qty 1

## 2024-01-21 MED ORDER — METOPROLOL TARTRATE 25 MG PO TABS
25.0000 mg | ORAL_TABLET | Freq: Two times a day (BID) | ORAL | 0 refills | Status: DC
  Filled 2024-01-21: qty 90, 45d supply, fill #0

## 2024-01-21 MED ORDER — LIDOCAINE HCL (PF) 1 % IJ SOLN: 5.0000 mL | INTRAMUSCULAR | Status: DC | PRN

## 2024-01-21 MED ORDER — HEPARIN SODIUM (PORCINE) 5000 UNIT/ML IJ SOLN
5000.0000 [IU] | Freq: Three times a day (TID) | INTRAMUSCULAR | Status: DC
  Administered 2024-01-21: 5000 [IU] via SUBCUTANEOUS
  Filled 2024-01-21: qty 1

## 2024-01-21 MED ORDER — ACETAMINOPHEN 650 MG RE SUPP
650.0000 mg | Freq: Four times a day (QID) | RECTAL | Status: DC | PRN
Start: 2024-01-21 — End: 2024-01-21

## 2024-01-21 MED ORDER — ACETAMINOPHEN 500 MG PO TABS: 1000.0000 mg | ORAL_TABLET | Freq: Four times a day (QID) | ORAL | Status: DC | PRN

## 2024-01-21 MED ORDER — ACETAMINOPHEN 650 MG RE SUPP: 650.0000 mg | Freq: Four times a day (QID) | RECTAL | Status: DC | PRN

## 2024-01-21 MED ORDER — ACETAMINOPHEN 325 MG PO TABS: 650.0000 mg | ORAL_TABLET | Freq: Four times a day (QID) | ORAL | Status: DC | PRN

## 2024-01-21 MED ORDER — METOPROLOL TARTRATE 25 MG PO TABS
25.0000 mg | ORAL_TABLET | Freq: Two times a day (BID) | ORAL | Status: DC
  Administered 2024-01-21: 25 mg via ORAL
  Filled 2024-01-21: qty 1

## 2024-01-21 MED ORDER — ALTEPLASE 2 MG IJ SOLR: 2.0000 mg | Freq: Once | INTRAMUSCULAR | Status: DC | PRN

## 2024-01-21 MED ORDER — LEVALBUTEROL HCL 0.63 MG/3ML IN NEBU: 0.6300 mg | INHALATION_SOLUTION | Freq: Four times a day (QID) | RESPIRATORY_TRACT | Status: DC | PRN

## 2024-01-21 MED ORDER — ATORVASTATIN CALCIUM 10 MG PO TABS: 20.0000 mg | ORAL_TABLET | Freq: Every evening | ORAL | Status: DC

## 2024-01-21 MED ORDER — MIDODRINE HCL 5 MG PO TABS
20.0000 mg | ORAL_TABLET | Freq: Three times a day (TID) | ORAL | Status: DC
Start: 2024-01-21 — End: 2024-01-21
  Administered 2024-01-21 (×2): 20 mg via ORAL
  Filled 2024-01-21 (×2): qty 4

## 2024-01-21 MED ORDER — ACETAMINOPHEN 325 MG PO TABS: 975.0000 mg | ORAL_TABLET | Freq: Four times a day (QID) | ORAL | Status: DC | PRN

## 2024-01-21 MED ORDER — POTASSIUM CHLORIDE CRYS ER 20 MEQ PO TBCR
40.0000 meq | EXTENDED_RELEASE_TABLET | Freq: Once | ORAL | Status: AC
  Administered 2024-01-21: 40 meq via ORAL
  Filled 2024-01-21: qty 2

## 2024-01-21 MED ORDER — DEXTROSE 50 % IV SOLN
INTRAVENOUS | Status: AC
  Filled 2024-01-21: qty 50

## 2024-01-21 MED ORDER — DIPHENHYDRAMINE HCL 25 MG PO CAPS: 25.0000 mg | ORAL_CAPSULE | Freq: Four times a day (QID) | ORAL | Status: DC | PRN

## 2024-01-21 NOTE — ED Notes (Signed)
  calcitRIOL  (ROCALTROL ) capsule 0.25 mcg unavailable to give pt.

## 2024-01-21 NOTE — Consult Note (Signed)
 Brief Cardiology Consult Note  34M seen in cardiology clinic yesterday, sent to the ED for chest pain.  Prior CABG with several post-op complications, ESRD, prior PE. Chest pain is L sided, associated with SOB, not necessarily with exertion. Persistent nausea since surgery a few months ago and describes poor PO.  In ED, intermittent, unpredictable tachycardia 100 to 140s.  Telemetry review suggests sinus tachycardia.  SBP stable in the 130s. Frail gentleman, no JVD, no LE edema  ECG sinus tachy with prolonged QT.  Nonspecific STs with anterior T wave inversions seen in old ECGs  Troponin 31 to 29. Chest CT negative for PE Echo in 08/2023 with EF 55% moderate LVH, no sig valvular abnormalities.  Last lexiscan  01/19/22 without reversible ischemia   Recommend: - ok to start low dose B-blocker as EF preserved (previously discontinued due to hypotension), added metoprolol  tartrate for now at 25 mg bid for antianginal and tachycardia indications - per last hospitalization which was also for chest pain, atypical symptoms but given cardiac history, AM team to consider repeat lexiscan  to see if any interval changes from 2023.  Adenosine is likely only modality given inability to exercise and baseline tachycardia. - continue clopidogrel  and atorvastatin  - please check orthostatic vitals  Andee Flatten, MD Cardiology on call

## 2024-01-21 NOTE — Assessment & Plan Note (Signed)
 01/21/24 had some hypoglycemia this AM due to NPO status. Treated with orange juice. Hypoglycemia has resolved.

## 2024-01-21 NOTE — TOC CM/SW Note (Signed)
 Transition of Care [facility]) - Inpatient Brief Assessment   Patient Details  Name: Christian [name] MRN: [id number] Date of Birth: 08/27/1945  [facility] CM/SW Contact:    Sudie Erminio Deems, RN Phone Number: 01/21/2024, 4:02 PM   Clinical Narrative: Patient presented for chest pain. Patient is currently in HD now and plans to discharge post HD. Hx ESRD: MWF HD days. Spouse states patient has personal care aide via [employer] 2 hours a day for HD days. Patient is wheelchair bound. Inpatient Case Manager spoke with spouse  regarding transportation home. Patient usually uses [facility]; however,  the office will close at 5:00 pm for travel. Inpatient Case Manager discussed PTAR for travel and spouse has declined PTAR for home. Spouse is asking that the staff bring the patient down in the wheelchair and get him into her fleeta. Spouse states her brother will assist to get the patient out once they get home. Patient is active with [facility] PT/OT/RN. [facility] is aware that the patient will transition home today. No further needs identified at this time.    Transition of Care Asessment: Insurance and Status: Insurance coverage has been reviewed Patient has primary care physician: Yes Home environment has been reviewed: home with spouse Prior level of function:: wheelchair bound Prior/Current Home Services: Current home services Social Drivers of Health Review: SDOH reviewed no interventions necessary Readmission risk has been reviewed: Yes Transition of care needs: no transition of care needs at this time

## 2024-01-21 NOTE — Progress Notes (Signed)
 Patient upon arrival to unit not placed on 2 Liter of oxygen. Sats 100% RA. When laid patient back to pull up in bed sats dropped to 87%.

## 2024-01-21 NOTE — Assessment & Plan Note (Signed)
 01/21/24 present on admission.

## 2024-01-21 NOTE — ED Notes (Signed)
 Attempted to give report to dialysis. Accepting RN is currently with pt and said they will call back for report.

## 2024-01-21 NOTE — Assessment & Plan Note (Signed)
 01/21/24 chronic.

## 2024-01-21 NOTE — Assessment & Plan Note (Signed)
 01/21/24 stable. Cardiology added lopressor  25 mg bid.  discussed with Dr. Mona with cards. Pt with non-ischemic, non-cardiac chest pain. He does not need stress test or further ischemic workup. Did discuss with pt's wife [name] that pt can take up to 975 mg of tylenol  3-4 times a day. She was only giving him 325-650 mg only once or twice a day(maximum). Discussed that he has musculoskeletal chest wall pain and that increased dose of tylenol  can be safely taken. His LFTs are normal.

## 2024-01-21 NOTE — ED Notes (Addendum)
 Notified Dr. Alfornia, reports she is unable to report to the bedside. EDP made aware of pt HR of 140s. EKG strip printed and provided to Dr. Midge.

## 2024-01-21 NOTE — Assessment & Plan Note (Signed)
 01/21/24 stable. Cardiology added lopressor  25 mg bid

## 2024-01-21 NOTE — Hospital Course (Addendum)
 CC: SOB. CP. Sent from cardiology office HPI: Christian [name] is a 78 y.o. male with medical history significant of ESRD on HD MWF, anemia of chronic disease, CAD status post LHC with DES/PCI to PDA in 2015 and NSTEMI 07/2019 with CABG x 4 and cardiac arrest due to tamponade, paroxysmal atrial flutter not on rate/rhythm control meds or anticoagulation, HFpEF, type 2 diabetes, chronic hypotension on midodrine , COPD, hyperlipidemia, mild dementia, multiple sclerosis not on treatment, BPH, history of PE.  Recent hospital admission 9/11-9/03/2024 for chest pain.  Patient did have new T wave changes on EKG but troponin flat.  He was evaluated by cardiology and his chest pain was felt to be noncardiac in etiology.  Patient had a follow-up visit with cardiology yesterday and sent to the ED for further evaluation of intermittent left-sided chest pain with dyspnea and tachycardia.  EKG done at cardiology office showed no acute changes.  Cardiology felt that his symptoms may be exacerbated by dehydration and malnutrition although considering possible ischemic evaluation with Myoview  and possible reinitiation of beta-blocker.   Patient reports having intermittent left-sided sharp nonexertional chest pain associated with shortness of breath since yesterday 2 PM.  His chest pain started when he was at cardiology office.  He does not use home oxygen.  Reports improvement of his shortness of breath now and denies active chest pain at this time.  He reports poor p.o. intake but denies nausea, vomiting, abdominal pain, diarrhea, or constipation.  Denies missing any dialysis treatments.   ED Course: Patient noted to be in sinus tachycardia with heart rate ranging from low 100s to 150s.  EKG showing QT prolongation (QTc 609) but no acute ischemic changes in comparison to previous EKG from 2 weeks ago.  Afebrile and not hypotensive.  Oxygen saturation in the 80s on room air and placed on 2 L [place].  Labs showing no leukocytosis or  anemia, potassium 3.5, bicarb 26, D-dimer 1.97, troponin 31> 29, BNP normal, magnesium  2.1.  CT angiogram chest negative for PE or pneumonia.  Showing stable rounded atelectasis in the left lower lobe and developing rounded atelectasis in the right lower lobe.  Also showing complex small bilateral pleural effusions with associated pleural thickening and calcifications, stable since prior examination.  Patient was given morphine  and 500 mL normal saline.  Significant Events: Admitted 01/20/2024 for chest pain   Admission Labs: WBC 7.9, HgB 15.3, plt 275 INR 0.9 D-dimer 1.97 Na 137, K 3.5, CO2 of 26, BUN 8, Scr 2.79, glu 80 Mg 2.1 BNP 91.5 TSH 1.918 Troponin I 31 >> 29  Admission Imaging Studies: CXR Small left pleural effusion, possibly partially loculated posteriorly. 2. 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 2. 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 Recommend annual follow with CT or MR arteriography for further surveillance if clinically indicated. 3. Stable rounded atelectasis in the left lower lobe and developing rounded atelectasis in the right lower lobe. 4. Complex small bilateral pleural effusions with associated pleural thickening and calcifications, stable since prior examination. See differential considerations above.  Significant Labs:   Significant Imaging Studies:   Antibiotic Therapy: Anti-infectives (From admission, onward)    None       Procedures:   Consultants: Cardiology nephrology

## 2024-01-21 NOTE — H&P (Signed)
 History and Physical    Christian [name] FMW:[id number] DOB: 24-Nov-1945 DOA: 01/20/2024  PCP: Rollene Almarie LABOR, MD  Patient coming from: Home  Chief Complaint: Chest pain  HPI: Christian [name] is a 78 y.o. male with medical history significant of ESRD on HD MWF, anemia of chronic disease, CAD status post LHC with DES/PCI to PDA in 2015 and NSTEMI 07/2019 with CABG x 4 and cardiac arrest due to tamponade, paroxysmal atrial flutter not on rate/rhythm control meds or anticoagulation, HFpEF, type 2 diabetes, chronic hypotension on midodrine , COPD, hyperlipidemia, mild dementia, multiple sclerosis not on treatment, BPH, history of PE.  Recent hospital admission 9/11-9/03/2024 for chest pain.  Patient did have new T wave changes on EKG but troponin flat.  He was evaluated by cardiology and his chest pain was felt to be noncardiac in etiology.  Patient had a follow-up visit with cardiology yesterday and sent to the ED for further evaluation of intermittent left-sided chest pain with dyspnea and tachycardia.  EKG done at cardiology office showed no acute changes.  Cardiology felt that his symptoms may be exacerbated by dehydration and malnutrition although considering possible ischemic evaluation with Myoview  and possible reinitiation of beta-blocker.  Patient reports having intermittent left-sided sharp nonexertional chest pain associated with shortness of breath since yesterday 2 PM.  His chest pain started when he was at cardiology office.  He does not use home oxygen.  Reports improvement of his shortness of breath now and denies active chest pain at this time.  He reports poor p.o. intake but denies nausea, vomiting, abdominal pain, diarrhea, or constipation.  Denies missing any dialysis treatments.  ED Course: Patient noted to be in sinus tachycardia with heart rate ranging from low 100s to 150s.  EKG showing QT prolongation (QTc 609) but no acute ischemic changes in comparison to previous EKG from 2  weeks ago.  Afebrile and not hypotensive.  Oxygen saturation in the 80s on room air and placed on 2 L [place].  Labs showing no leukocytosis or anemia, potassium 3.5, bicarb 26, D-dimer 1.97, troponin 31> 29, BNP normal, magnesium  2.1.  CT angiogram chest negative for PE or pneumonia.  Showing stable rounded atelectasis in the left lower lobe and developing rounded atelectasis in the right lower lobe.  Also showing complex small bilateral pleural effusions with associated pleural thickening and calcifications, stable since prior examination.  Patient was given morphine  and 500 mL normal saline.  Review of Systems:  Review of Systems  All other systems reviewed and are negative.   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   Diabetic polyneuropathy    Elevated liver enzymes 08/09/2019   Hiatal hernia    History of coronary artery stent placement    History of pulmonary embolism 08/09/2019   HTN (hypertension) 11/12/2009   Hyperlipidemia associated with type 2 diabetes mellitus 11/12/2009   Left leg weakness 11/23/2018   Mild dementia, 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 LIMA TO LAD, RIMA TO PDA, RADIAL ARTERY TO CIRC AND SEQUENTIALLY TO OM1.  Sleep difficulties 05/30/2020   Type II diabetes mellitus 10/10/2014    Past Surgical History:  Procedure Laterality Date   A/V FISTULAGRAM Left 08/12/2022   Procedure: A/V Fistulagram;  Surgeon: Lanis Fonda BRAVO, MD;  Location: [facility] INVASIVE CV LAB;  Service: Cardiovascular;  Laterality: Left;   A/V  FISTULAGRAM N/A 09/07/2023   Procedure: A/V Fistulagram;  Surgeon: Serene Gaile ORN, MD;  Location: [facility] INVASIVE CV LAB;  Service: Cardiovascular;  Laterality: N/A;   APPLICATION OF WOUND VAC N/A 08/24/2019   Procedure: APPLICATION OF WOUND VAC;  Surgeon: German Bartlett PEDLAR, MD;  Location: [facility] OR;  Service: Thoracic;  Laterality: N/A;   APPLICATION OF WOUND VAC  08/29/2019   Procedure: Wound Vac change;  Surgeon: German Bartlett PEDLAR, MD;  Location: [facility] OR;  Service: Open Heart Surgery;;   APPLICATION OF WOUND VAC N/A 09/04/2019   Procedure: Application Of Wound Vac;  Surgeon: Army Dallas NOVAK, MD;  Location: [facility] OR;  Service: Open Heart Surgery;  Laterality: N/A;   APPLICATION OF WOUND VAC N/A 09/06/2019   Procedure: APPLICATION OF ACELL, APPLICATION OF WOUND VAC USING PREVENA INCISIONAL  DRESSING;  Surgeon: Lowery Estefana RAMAN, DO;  Location: [facility] OR;  Service: Plastics;  Laterality: N/A;   AV FISTULA PLACEMENT Left 09/18/2020   Procedure: ARTERIOVENOUS (AV) FISTULA CREATION LEFT VERSUS GRAFT;  Surgeon: Serene Gaile ORN, MD;  Location: [facility] OR;  Service: Vascular;  Laterality: Left;   BASCILIC VEIN TRANSPOSITION Left 10/31/2020   Procedure: LEFT SECOND STAGE BASCILIC VEIN TRANSPOSITION;  Surgeon: Serene Gaile ORN, MD;  Location: [facility] OR;  Service: Vascular;  Laterality: Left;   CARDIAC CATHETERIZATION     CORONARY ARTERY BYPASS GRAFT N/A 08/15/2019   Procedure: CORONARY ARTERY BYPASS GRAFTING (CABG), x 4 using bilateral IMAs and left radial artery .  LIMA TO LAD, RIMA TO PDA, RADIAL ARTERY TO CIRC AND SEQUENTIALLY TO OM1.;  Surgeon: German Bartlett PEDLAR, MD;  Location: [facility] OR;  Service: Open Heart Surgery;  Laterality: N/A;   CORONARY STENT PLACEMENT  02/27/2014   distal rt/pd coronary       dr ladona   CYSTO/ BLADDER BIOPSY'S/ CAUTHERIZATION  01-14-2004  DR CHALES   EXPLORATION POST OPERATIVE OPEN HEART N/A 08/16/2019   Procedure: Chest Closure S?P CABG WITH APPLICATION OF PREVENA  INCISIONAL WOUND VAC;  Surgeon:  German Bartlett PEDLAR, MD;  Location: [facility] OR;  Service: Open Heart Surgery;  Laterality: N/A;   EXPLORATION POST OPERATIVE OPEN HEART N/A 08/21/2019   Procedure: CHEST WASHOUT S/P OPEN CHEST;  Surgeon: German Bartlett PEDLAR, MD;  Location: [facility] OR;  Service: Open Heart Surgery;  Laterality: N/A;  Open chest with Esmark dressing with Ioban sealant coverage.   EXPLORATION POST OPERATIVE OPEN HEART N/A 08/18/2019   Procedure: EXPLORATION POST OPERATIVE OPEN HEART (performed 04/23 on unit);  Surgeon: German Bartlett PEDLAR, MD;  Location: [facility] OR;  Service: Open Heart Surgery;  Laterality: N/A;   EXPLORATION POST OPERATIVE OPEN HEART N/A 08/24/2019   Procedure: CHEST WASHOUT POST OPERATIVE OPEN HEART;  Surgeon: German Bartlett PEDLAR, MD;  [facility]: [facility];  Service: Open Heart Surgery;  Laterality: N/A;   EXPLORATION POST OPERATIVE OPEN HEART N/A 08/29/2019   Procedure: CHEST WOUND WASHOUT POST OPERATIVE OPEN HEART;  Surgeon: German Bartlett PEDLAR, MD;  [facility]: [facility];  Service: Open Heart Surgery;  Laterality: N/A;   EXPLORATION POST OPERATIVE OPEN HEART N/A 09/04/2019   Procedure: MEDIASTINAL EXPLORATION WITH STERNAL WOUND IRRIGATION;  Surgeon: Army Dallas NOVAK, MD;  Location: [facility] OR;  Service: Open Heart Surgery;  Laterality: N/A;   EXPLORATION POST OPERATIVE OPEN HEART N/A 09/14/2019   Procedure: EVACUATION OF HEMATOMA;  Surgeon: Army Dallas NOVAK, MD;  Location: [facility] OR;  Service: Open Heart Surgery;  Laterality: N/A;   INSERTION OF DIALYSIS CATHETER N/A 09/16/2023   Procedure: INSERTION OF DIALYSIS CATHETER;  Surgeon: Gretta Lonni PARAS, MD;  Location: [facility] OR;  Service: Vascular;  Laterality: N/A;  TUNNELED DIALYSIS CATHETER   IR FLUORO GUIDE CV LINE LEFT  09/19/2019   IR FLUORO GUIDE CV LINE RIGHT  09/08/2023   IR GASTROSTOMY TUBE MOD SED  10/04/2019   IR GASTROSTOMY TUBE REMOVAL  11/29/2019   IR PATIENT EVAL TECH 0-60 MINS  09/29/2019   IR US  GUIDE VASC ACCESS LEFT  09/19/2019   IR US  GUIDE VASC ACCESS RIGHT  09/08/2023    LAPAROSCOPIC APPENDECTOMY N/A 08/31/2023   Procedure: APPENDECTOMY, LAPAROSCOPIC;  Surgeon: Paola Dreama SAILOR, MD;  Location: [facility] OR;  Service: General;  Laterality: N/A;   LAPAROSCOPIC LYSIS OF ADHESIONS N/A 09/06/2019   Procedure: LAPAROSCOPIC OMENTAL HARVEST;  Surgeon: Stevie Herlene Righter, MD;  Location: [facility] OR;  Service: General;  Laterality: N/A;   LEFT HEART CATH AND CORONARY ANGIOGRAPHY N/A 08/10/2019   Procedure: LEFT HEART CATH AND CORONARY ANGIOGRAPHY;  Surgeon: Elmira Newman PARAS, MD;  Location: [facility] INVASIVE CV LAB;  Service: Cardiovascular;  Laterality: N/A;   LEFT HEART CATHETERIZATION WITH CORONARY ANGIOGRAM N/A 02/27/2014   Procedure: LEFT HEART CATHETERIZATION WITH CORONARY ANGIOGRAM;  Surgeon: Erick JONELLE Bergamo, MD;  Location: [facility] CATH LAB;  Service: Cardiovascular;  Laterality: N/A;   MEDIASTINAL EXPLORATION N/A 09/06/2019   Procedure: MEDIASTINAL EXPLORATION;  Surgeon: Army Dallas NOVAK, MD;  Location: [facility] OR;  Service: Thoracic;  Laterality: N/A;   PECTORALIS FLAP  09/06/2019   Procedure: Pectoralis ADVANCEMENT Flap;  Surgeon: Lowery Estefana RAMAN, DO;  Location: [facility] OR;  Service: Plastics;;   PERCUTANEOUS CORONARY STENT INTERVENTION (PCI-S)  02/27/2014   Procedure: PERCUTANEOUS CORONARY STENT INTERVENTION (PCI-S);  Surgeon: Erick JONELLE Bergamo, MD;  Location: [facility] CATH LAB;  Service: Cardiovascular;;  rt PDA  3.0/28mm Promus stent   PERIPHERAL VASCULAR INTERVENTION Left 08/12/2022   Procedure: PERIPHERAL VASCULAR INTERVENTION;  Surgeon: Lanis Fonda BRAVO, MD;  Location: [facility] INVASIVE CV LAB;  Service: Cardiovascular;  Laterality: Left;   RADIAL ARTERY HARVEST Left 08/15/2019   Procedure: Radial Artery Harvest;  Surgeon: German Bartlett PEDLAR, MD;  Location: [facility] OR;  Service: Open Heart Surgery;  Laterality: Left;   REMOVAL OF A HERO DEVICE Right 09/16/2023   Procedure: REMOVAL OF TEMPORARY TUNNELED HEMODIALYSIS CATHETER;  Surgeon: Gretta Lonni PARAS, MD;  Location: [facility] OR;  Service: Vascular;   Laterality: Right;   RIB PLATING N/A 09/06/2019   Procedure: STERNAL PLATING;  Surgeon: Army Dallas NOVAK, MD;  Location: [facility] OR;  Service: Thoracic;  Laterality: N/A;   TEE WITHOUT CARDIOVERSION N/A 08/15/2019   Procedure: TRANSESOPHAGEAL ECHOCARDIOGRAM (TEE);  Surgeon: German Bartlett PEDLAR, MD;  Location: [facility] OR;  Service: Open Heart Surgery;  Laterality: N/A;   TRACHEOSTOMY TUBE PLACEMENT  08/29/2019   Procedure: Tracheostomy;  Surgeon: German Bartlett PEDLAR, MD;  Location: [facility] OR;  Service: Open Heart Surgery; Decannulated 6/10/20211   TRANSURETHRAL RESECTION OF PROSTATE  04/04/2012   Procedure: TRANSURETHRAL RESECTION OF THE PROSTATE WITH GYRUS INSTRUMENTS;  Surgeon: Arlena LILLETTE Gal, MD;  Location: [facility];  Service: Urology;  Laterality: N/A;   TRANSURETHRAL RESECTION OF PROSTATE N/A 09/27/2014   Procedure: TRANSURETHRAL RESECTION OF THE PROSTATE ;  Surgeon: Arlena Gal, MD;  Location: [facility] ORS;  Service: Urology;  Laterality: N/A;   TUNNELLED CATHETER EXCHANGE N/A 09/07/2023   Procedure: TUNNELLED CATHETER EXCHANGE;  Surgeon: Serene Gaile ORN, MD;  Location: [facility] INVASIVE CV LAB;  Service: Cardiovascular;  Laterality: N/A;   ULTRASOUND GUIDANCE FOR VASCULAR ACCESS Right 09/16/2023   Procedure: ULTRASOUND GUIDANCE, FOR VASCULAR ACCESS;  Surgeon: Gretta Lonni PARAS, MD;  Location: [facility] OR;  Service: Vascular;  Laterality: Right;   UPPER GASTROINTESTINAL ENDOSCOPY       reports that he quit smoking about 50 years ago. His smoking use included cigarettes. He started smoking about 70 years ago. He has a 10 pack-year smoking history. He has never used smokeless tobacco. He reports that he does not currently use alcohol. He reports that he does not use drugs.  No Known Allergies  Family History  Problem Relation Age of Onset   Diabetes Mother    Hypertension Mother    Diabetes Father    Diabetes Brother    Hypertension Brother    Bone cancer Brother    Diabetes Brother      Prior to Admission medications   Medication Sig Start Date End Date Taking? Authorizing Provider  acetaminophen  (TYLENOL ) 325 MG tablet Take 2 tablets (650 mg total) by mouth every 6 (six) hours as needed for mild pain (pain score 1-3), fever or headache. 10/01/23  Yes Danton Reyes DASEN, MD  atorvastatin  (LIPITOR) 20 MG tablet Take 1 tablet (20 mg total) by mouth every evening. 08/24/23  Yes Ladona Heinz, MD  budesonide -formoterol  (SYMBICORT ) 160-4.5 MCG/ACT inhaler INHALE 2 PUFFS INTO THE LUNGS TWICE A DAY 01/11/24  Yes Rollene Almarie LABOR, MD  calcitRIOL  (ROCALTROL ) 0.25 MCG capsule Take 0.25 mcg by mouth 3 (three) times a week. Take one capsule at dinner after on Mon-Wed-Fri. 07/21/23  Yes [provider]  clopidogrel  (PLAVIX ) 75 MG tablet Take 1 tablet (75 mg total) by mouth daily. 06/24/23  Yes Ladona Heinz, MD  diphenhydrAMINE  (BENADRYL ) 25 mg capsule Take 1 capsule (25 mg total) by mouth every 6 (six) hours as needed for itching. 10/01/23  Yes Danton Reyes DASEN, MD  docusate sodium  (COLACE) 100 MG capsule Take 1 capsule (100 mg total) by mouth 2 (two) times daily. 08/31/23 08/30/24 Yes Paola Dreama SAILOR, MD  isosorbide  dinitrate (ISORDIL ) 30 MG tablet Take 30 mg by mouth 3 (three) times daily. 11/13/23  Yes [provider]  lidocaine  (LIDODERM ) 5 % Place 1 patch onto the skin daily. Remove & Discard patch within 12 hours or as directed by MD 01/11/24  Yes Rollene Almarie LABOR, MD  midodrine  (PROAMATINE ) 10 MG tablet Take 2 tablets (20 mg total) by mouth 3 (three) times daily with meals. 01/11/24  Yes Rollene Almarie LABOR, MD  multivitamin (RENA-VIT) TABS tablet Take 1 tablet by mouth at bedtime. 10/01/23  Yes Danton Reyes DASEN, MD  senna-docusate (SENOKOT-S) 8.6-50 MG tablet Take 1 tablet by mouth 2 (two) times daily. 10/01/23  Yes Danton Reyes DASEN, MD  ferrous sulfate  325 (65 FE) MG tablet Take 325 mg by mouth daily with breakfast.    [provider]    Physical  Exam: Vitals:   01/21/24 0025 01/21/24 0030 01/21/24 0045 01/21/24 0219  BP:  126/75 111/73   Pulse: 99 (!) 134 97   Resp: 11 12 13    Temp:    97.8 F (36.6 C)  TempSrc:    Oral  SpO2: 100% 100% 100%     Physical Exam Vitals  reviewed.  Constitutional:      General: He is not in acute distress. HENT:     Head: Normocephalic and atraumatic.  Eyes:     Extraocular Movements: Extraocular movements intact.  Cardiovascular:     Rate and Rhythm: Regular rhythm. Tachycardia present.     Heart sounds: Normal heart sounds.  Pulmonary:     Effort: Pulmonary effort is normal. No respiratory distress.     Breath sounds: No stridor. No wheezing, rhonchi or rales.  Abdominal:     General: Bowel sounds are normal. There is no distension.     Palpations: Abdomen is soft.     Tenderness: There is no abdominal tenderness. There is no guarding.  Musculoskeletal:     Cervical back: Normal range of motion.     Right lower leg: No edema.     Left lower leg: No edema.  Skin:    General: Skin is warm and dry.  Neurological:     General: No focal deficit present.     Mental Status: He is alert and oriented to person, place, and time.     Labs on Admission: I have personally reviewed following labs and imaging studies  CBC: Recent Labs  Lab 01/20/24 1644  WBC 7.9  HGB 15.3  HCT 47.4  MCV 99.8  PLT 275   Basic Metabolic Panel: Recent Labs  Lab 01/20/24 2011  NA 137  K 3.5  CL 94*  CO2 26  GLUCOSE 80  BUN 8  CREATININE 2.79*  CALCIUM  9.1  MG 2.1   GFR: Estimated Creatinine Clearance: 19.7 mL/min (A) (by C-G formula based on SCr of 2.79 mg/dL (H)). Liver Function Tests: No results for input(s): AST, ALT, ALKPHOS, BILITOT, PROT, ALBUMIN  in the last 168 hours. No results for input(s): LIPASE, AMYLASE in the last 168 hours. No results for input(s): AMMONIA in the last 168 hours. Coagulation Profile: Recent Labs  Lab 01/20/24 2011  INR 0.9   Cardiac  Enzymes: No results for input(s): CKTOTAL, CKMB, CKMBINDEX, TROPONINI in the last 168 hours. BNP (last 3 results) No results for input(s): PROBNP in the last 8760 hours. HbA1C: No results for input(s): HGBA1C in the last 72 hours. CBG: No results for input(s): GLUCAP in the last 168 hours. Lipid Profile: No results for input(s): CHOL, HDL, LDLCALC, TRIG, CHOLHDL, LDLDIRECT in the last 72 hours. Thyroid  Function Tests: No results for input(s): TSH, T4TOTAL, FREET4, T3FREE, THYROIDAB in the last 72 hours. Anemia Panel: No results for input(s): VITAMINB12, FOLATE, FERRITIN, TIBC, IRON, RETICCTPCT in the last 72 hours. Urine analysis:    Component Value Date/Time   COLORURINE AMBER (A) 09/23/2023 1232   APPEARANCEUR CLOUDY (A) 09/23/2023 1232   LABSPEC 1.025 09/23/2023 1232   PHURINE 5.0 09/23/2023 1232   GLUCOSEU NEGATIVE 09/23/2023 1232   HGBUR SMALL (A) 09/23/2023 1232   BILIRUBINUR NEGATIVE 09/23/2023 1232   KETONESUR NEGATIVE 09/23/2023 1232   PROTEINUR 100 (A) 09/23/2023 1232   NITRITE NEGATIVE 09/23/2023 1232   LEUKOCYTESUR MODERATE (A) 09/23/2023 1232    Radiological Exams on Admission: CT Angio Chest PE W and/or Wo Contrast Result Date: 01/20/2024 EXAM: CTA CHEST 01/20/2024 10:07:48 PM TECHNIQUE: CTA of the chest was performed after the administration of intravenous contrast. Multiplanar reformatted images are provided for review. MIP images are provided for review. Automated exposure control, iterative reconstruction, and/or weight based adjustment of the mA/kV was utilized to reduce the radiation dose to as low as reasonably achievable. The study was performed with and  without IV contrast, utilizing 65mL of iohexol  (OMNIPAQUE ) 350 MG/ML injection. COMPARISON: Comparison is made to studies dated 02/2024 and 12/12/2020. CLINICAL HISTORY: Pulmonary embolism (PE) suspected, low to intermediate prob, positive D-dimer. Patient presented  with complaints of shortness of breath (SOB), was tachy and SOB while in office, and advised to come to ED for further evaluation. Patient has had decreased appetite and denies any pain. FINDINGS: PULMONARY ARTERIES: Pulmonary arteries are adequately opacified for evaluation. No acute pulmonary embolus. Central pulmonary arteries are of normal caliber. No intraluminal filling defect identified to suggest acute or chronic pulmonary embolism. MEDIASTINUM: The heart and pericardium demonstrate no acute abnormality. Global cardiac size is within normal limits. No pericardial effusion. Status post coronary artery bypass grafting. Sternal nonunion noted. Right internal jugular hemodialysis catheter tip seen within the right atrium. LYMPH NODES: No mention of mediastinal, hilar, or axillary lymphadenopathy. LUNGS AND PLEURA: The lungs are not entirely without acute process. Rounded subpleural consolidation within the left lower lobe with associated marked volume loss is seen, in keeping with changes of rounded atelectasis, stable since a remote prior examination. 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 Stable subpleural consolidation and associated volume loss within the posterobasal right lower lobe likely represent an additional focus of developing rounded atelectasis. Complex small bilateral pleural effusions with associated pleural thickening and calcifications are seen, suggesting chronic pleural effusions and a possible history of trauma, inflammation, or prior asbestos exposure. No pneumothorax. UPPER ABDOMEN: No acute abnormality within the visualized upper abdomen. Thickening of the adrenal glands is identified, in keeping with changes of adrenal hyperplasia. SOFT TISSUES AND BONES: No acute bone abnormality. Superimposed moderate atherosclerotic calcification within the thoracic aorta. 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 Ascending and distal descending thoracic aorta are of normal caliber. IMPRESSION: 1. No evidence of pulmonary embolism. 2. 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 Recommend annual follow with CT or MR arteriography for further surveillance if clinically indicated. 3. Stable rounded atelectasis in the left lower lobe and developing rounded atelectasis in the right lower lobe. 4. Complex small bilateral pleural effusions with associated pleural thickening and calcifications, stable since prior examination. See differential considerations above. Electronically signed by: Dorethia Molt MD 01/20/2024 10:33 PM EDT RP Workstation: [id number]   DG Chest 2 View Result Date: 01/20/2024 CLINICAL DATA:  Shortness of breath EXAM: CHEST - 2 VIEW COMPARISON:  Chest x-ray 01/06/2024.  CT of the chest 01/06/2024. FINDINGS: Right-sided central venous catheter tip ends in the distal SVC. Sternotomy wires and fixation devices are present. The cardiomediastinal silhouette is within normal limits. Small left pleural effusion is seen, possibly partially loculated posteriorly. Right lung appears clear. Calcified pleural plaques are again seen in the upper lungs. No pneumothorax or acute fracture. Degenerative changes affect the shoulders. IMPRESSION: 1. Small left pleural effusion, possibly partially loculated posteriorly. 2. 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 Electronically Signed   By: Greig Pique M.D.   On: 01/20/2024 17:46    Assessment and Plan  Intermittent left-sided chest pain Sinus tachycardia History of CAD status post DES/PCI to PDA in 2015 and CABG x 4 in 2021 EKG repeated multiple times in the ED and not showing acute ischemic changes in comparison to previous EKG from 2 weeks ago.  Troponin mildly elevated but flat, not consistent with  ACS.  Patient tachycardic in the ED with heart rate ranging from low 100s to up to 150s, sinus tachycardia.  CT angiogram chest negative for PE.  Tachycardia could possibly be due to dehydration but he did receive 500 mL IV fluids in the ED without much improvement.  No infectious signs or symptoms to explain tachycardia.  History of cardiac tamponade in the past but no muffled heart sounds or hypotension.  Patient denies active chest pain at this time and heart rate currently 100-110s at rest.  I have consulted cardiology.  He may need to be started on low-dose beta-blocker.  Does take midodrine  for chronic hypotension but blood pressure has remained stable in the ED.  Will also check TSH and UDS ordered.  Continue home Plavix  and Lipitor.  QT prolongation QTc 609 on most recent EKG.  Potassium and magnesium  within normal range.  Avoid QT prolonging drugs and follow-up repeat EKG in the morning.  Acute hypoxemic respiratory failure Oxygen saturation in the 80s on room air, currently stable on 2 L [place].  No wheezing on exam.  CT angiogram chest negative for PE or pneumonia.   Showing stable rounded atelectasis in the left lower lobe and developing rounded atelectasis in the right lower lobe.  Also showing complex small bilateral pleural effusions with associated pleural thickening and calcifications, stable since prior examination.  Continue supplemental oxygen, wean as tolerated.  ESRD on HD MWF No indication for urgent dialysis overnight.  Consult nephrology in the morning as he is due for routine dialysis today.  Chronic HFpEF Last echo done in May 2025 showing EF 55 to 60%.  BNP normal.  Volume management with dialysis as above.  Diet controlled type 2 diabetes A1c 4.8 on 12/14/2023.  Chronic hypotension Continue midodrine .  Hyperlipidemia Continue Lipitor.  COPD Stable, no wheezing.  Placed on Breo which is hospital formulary replacement for his home Symbicort .  Xopenex  PRN.  Incidental  CT finding CT showing mild dilation of the proximal descending thoracic aorta measuring 3.2 cm in diameter with superimposed moderate atherosclerotic calcification.  Radiologist recommending annual follow with CT or MR arteriography.  DVT prophylaxis: SQ Heparin  Code Status: Full Code (discussed with the patient) Family Communication: Wife at bedside. Consults called: Cardiology (Dr. Cesario) Level of care: Progressive Care Unit Admission status: It is my clinical opinion that referral for OBSERVATION is reasonable and necessary in this patient based on the above information provided. The aforementioned taken together are felt to place the patient at high risk for further clinical deterioration. However, it is anticipated that the patient may be medically stable for discharge from the hospital within 24 to 48 hours.  Editha Ram MD [facility]  If 7PM-7AM, please contact night-coverage [email or web address]  01/21/2024, 3:27 AM

## 2024-01-21 NOTE — Assessment & Plan Note (Signed)
 01/21/24 discussed with Dr. Mona with cards. Pt with non-ischemic, non-cardiac chest pain. He does not need stress test or further ischemic workup. Did discuss with pt's wife [name] that pt can take up to 975 mg of tylenol  3-4 times a day. She was only giving him 325-650 mg only once or twice a day(maximum). Discussed that he has musculoskeletal chest wall pain and that increased dose of tylenol  can be safely taken. His LFTs are normal.

## 2024-01-21 NOTE — ED Notes (Signed)
Pt states he does not make urine.  

## 2024-01-21 NOTE — Progress Notes (Signed)
 DAILY PROGRESS NOTE   Patient Name: Christian [name] Date of Encounter: 01/21/2024 Cardiologist: Gordy Bergamo, MD  Chief Complaint   No chest pain  Patient Profile   78 yo male with prior CABG, history of PE, ESRD and recurrent chest pain, admitted for left-sided chest pain  Subjective   No further chest pain - given morphine . Troponin again flat at 31 and 29, similar to prior presentations. No ACS. No PE by CT scan and was scanned 2 weeks ago and admitted for a similar presentation. My suspicion for coronary ischemia is low.  Did have some recurrent paroxysmal atrial flutter overnight. Was started on low dose metoprolol .  Objective   Vitals:   01/21/24 0700 01/21/24 0752 01/21/24 0755 01/21/24 0853  BP: 125/70 (!) 149/87    Pulse: 94 (!) 102    Resp: 17 14    Temp:  97.9 F (36.6 C)    TempSrc:  Oral    SpO2: 100% 100%  100%  Weight:   54.4 kg   Height:   5' 9 (1.753 m)     Intake/Output Summary (Last 24 hours) at 01/21/2024 9061 Last data filed at 01/20/2024 2245 Gross per 24 hour  Intake 500 ml  Output --  Net 500 ml   Filed Weights   01/21/24 0755  Weight: 54.4 kg    Physical Exam   General appearance: alert and no distress Lungs: clear to auscultation bilaterally Heart: regular rate and rhythm, S1, S2 normal, no murmur, click, rub or gallop Extremities: extremities normal, atraumatic, no cyanosis or edema Neurologic: Grossly normal  Inpatient Medications    Scheduled Meds:  atorvastatin   20 mg Oral QPM   calcitRIOL   0.25 mcg Oral Once per day on Monday Wednesday Friday   Chlorhexidine  Gluconate Cloth  6 each Topical Q0600   clopidogrel   75 mg Oral Daily   fluticasone  furoate-vilanterol  1 puff Inhalation Daily   heparin   5,000 Units Subcutaneous Q8H   insulin  aspart  0-6 Units Subcutaneous Q4H   metoprolol  tartrate  25 mg Oral BID   midodrine   20 mg Oral TID WC    Continuous Infusions:   PRN Meds: acetaminophen  **OR** acetaminophen ,  levalbuterol    Labs   Results for orders placed or performed during the hospital encounter of 01/20/24 (from the past 48 hours)  CBC     Status: Abnormal   Collection Time: 01/20/24  4:44 PM  Result Value Ref Range   WBC 7.9 4.0 - 10.5 K/uL   RBC 4.75 4.22 - 5.81 MIL/uL   Hemoglobin 15.3 13.0 - 17.0 g/dL   HCT 52.5 60.9 - 47.9 %   MCV 99.8 80.0 - 100.0 fL   MCH 32.2 26.0 - 34.0 pg   MCHC 32.3 30.0 - 36.0 g/dL   RDW 84.2 (H) 88.4 - 84.4 %   Platelets 275 150 - 400 K/uL   nRBC 0.0 0.0 - 0.2 %    Comment: Performed at [facility], [address], [place], [place] [address]  D-dimer, quantitative     Status: Abnormal   Collection Time: 01/20/24  7:37 PM  Result Value Ref Range   D-Dimer, Quant 1.97 (H) 0.00 - 0.50 ug/mL-FEU    Comment: (NOTE) At the manufacturer cut-off value of 0.5 g/mL FEU, this assay has a negative predictive value of 95-100%.This assay is intended for use in conjunction with a clinical pretest probability (PTP) assessment model to exclude pulmonary embolism (PE) and deep venous thrombosis (DVT)  in outpatients suspected of PE or DVT. Results should be correlated with clinical presentation. Performed at [facility], [address], [place], [place] [address]   Troponin I (High Sensitivity)     Status: Abnormal   Collection Time: 01/20/24  7:37 PM  Result Value Ref Range   Troponin I (High Sensitivity) 31 (H) <18 ng/L    Comment: (NOTE) Elevated high sensitivity troponin I (hsTnI) values and significant  changes across serial measurements may suggest ACS but many other  chronic and acute conditions are known to elevate hsTnI results.  Refer to the Links section for chest pain algorithms and additional  guidance. Performed at [facility], [address], [place], [place] [address]   BNP (Order if Patient has history of Heart Failure)     Status: None   Collection Time: 01/20/24  8:11 PM  Result Value Ref Range   B Natriuretic  Peptide 91.5 0.0 - 100.0 pg/mL    Comment: Performed at [facility], [address], [place], [place] [address]  Protime-INR     Status: None   Collection Time: 01/20/24  8:11 PM  Result Value Ref Range   Prothrombin Time 13.0 11.4 - 15.2 seconds   INR 0.9 0.8 - 1.2    Comment: (NOTE) INR goal varies based on device and disease states. Performed at [facility], [address] [place], [place] [address]   Basic metabolic panel with GFR     Status: Abnormal   Collection Time: 01/20/24  8:11 PM  Result Value Ref Range   Sodium 137 135 - 145 mmol/L   Potassium 3.5 3.5 - 5.1 mmol/L   Chloride 94 (L) 98 - 111 mmol/L   CO2 26 22 - 32 mmol/L   Glucose, Bld 80 70 - 99 mg/dL    Comment: Glucose reference range applies only to samples taken after fasting for at least 8 hours.   BUN 8 8 - 23 mg/dL   Creatinine, Ser 7.20 (H) 0.61 - 1.24 mg/dL   Calcium  9.1 8.9 - 10.3 mg/dL   GFR, Estimated 23 (L) >60 mL/min    Comment: (NOTE) Calculated using the CKD-EPI Creatinine Equation (2021)    Anion gap 17 (H) 5 - 15    Comment: Performed at [facility], [address], [place], [place] [address]  Magnesium      Status: None   Collection Time: 01/20/24  8:11 PM  Result Value Ref Range   Magnesium  2.1 1.7 - 2.4 mg/dL    Comment: Performed at [facility], [address], [place], [place] [address]  Troponin I (High Sensitivity)     Status: Abnormal   Collection Time: 01/20/24 10:23 PM  Result Value Ref Range   Troponin I (High Sensitivity) 29 (H) <18 ng/L    Comment: (NOTE) Elevated high sensitivity troponin I (hsTnI) values and significant  changes across serial measurements may suggest ACS but many other  chronic and acute conditions are known to elevate hsTnI results.  Refer to the Links section for chest pain algorithms and additional  guidance. Performed at [facility], [address], [place], [place] [address]   TSH     Status: None   Collection  Time: 01/21/24  5:15 AM  Result Value Ref Range   TSH 1.918 0.350 - 4.500 uIU/mL    Comment: Performed by a 3rd Generation assay with a functional sensitivity of <=0.01 uIU/mL. Performed at [facility], [address] [place], [place] [address]   Comprehensive metabolic panel     Status: Abnormal   Collection Time: 01/21/24  5:15 AM  Result Value Ref Range   Sodium 138 135 - 145 mmol/L   Potassium 3.1 (L) 3.5 - 5.1 mmol/L   Chloride 96 (L) 98 - 111 mmol/L   CO2 28 22 - 32 mmol/L   Glucose, Bld 69 (L) 70 - 99 mg/dL    Comment: Glucose reference range applies only to samples taken after fasting for at least 8 hours.   BUN 9 8 - 23 mg/dL   Creatinine, Ser 6.86 (H) 0.61 - 1.24 mg/dL   Calcium  8.8 (L) 8.9 - 10.3 mg/dL   Total Protein 5.2 (L) 6.5 - 8.1 g/dL   Albumin  1.9 (L) 3.5 - 5.0 g/dL   AST 28 15 - 41 U/L   ALT 15 0 - 44 U/L   Alkaline Phosphatase 112 38 - 126 U/L   Total Bilirubin 1.0 0.0 - 1.2 mg/dL   GFR, Estimated 20 (L) >60 mL/min    Comment: (NOTE) Calculated using the CKD-EPI Creatinine Equation (2021)    Anion gap 14 5 - 15    Comment: Performed at [facility], [address] [place], [place] [address]  CBG monitoring, ED     Status: Abnormal   Collection Time: 01/21/24  9:32 AM  Result Value Ref Range   Glucose-Capillary 64 (L) 70 - 99 mg/dL    Comment: Glucose reference range applies only to samples taken after fasting for at least 8 hours.   Comment 1 Notify RN    Comment 2 Document in Chart     ECG   Atrial flutter - Personally Reviewed  Telemetry   Sinus rhythm with paroxysmal atrial flutter overnight - Personally Reviewed  Radiology    CT Angio Chest PE W and/or Wo Contrast Result Date: 01/20/2024 EXAM: CTA CHEST 01/20/2024 10:07:48 PM TECHNIQUE: CTA of the chest was performed after the administration of intravenous contrast. Multiplanar reformatted images are provided for review. MIP images are provided for review. Automated exposure  control, iterative reconstruction, and/or weight based adjustment of the mA/kV was utilized to reduce the radiation dose to as low as reasonably achievable. The study was performed with and without IV contrast, utilizing 65mL of iohexol  (OMNIPAQUE ) 350 MG/ML injection. COMPARISON: Comparison is made to studies dated 02/2024 and 12/12/2020. CLINICAL HISTORY: Pulmonary embolism (PE) suspected, low to intermediate prob, positive D-dimer. Patient presented with complaints of shortness of breath (SOB), was tachy and SOB while in office, and advised to come to ED for further evaluation. Patient has had decreased appetite and denies any pain. FINDINGS: PULMONARY ARTERIES: Pulmonary arteries are adequately opacified for evaluation. No acute pulmonary embolus. Central pulmonary arteries are of normal caliber. No intraluminal filling defect identified to suggest acute or chronic pulmonary embolism. MEDIASTINUM: The heart and pericardium demonstrate no acute abnormality. Global cardiac size is within normal limits. No pericardial effusion. Status post coronary artery bypass grafting. Sternal nonunion noted. Right internal jugular hemodialysis catheter tip seen within the right atrium. LYMPH NODES: No mention of mediastinal, hilar, or axillary lymphadenopathy. LUNGS AND PLEURA: The lungs are not entirely without acute process. Rounded subpleural consolidation within the left lower lobe with associated marked volume loss is seen, in keeping with changes of rounded atelectasis, stable since a remote prior examination. 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 Stable subpleural consolidation and associated volume  loss within the posterobasal right lower lobe likely represent an additional focus of developing rounded atelectasis. Complex small bilateral pleural effusions with associated pleural thickening and calcifications are seen, suggesting chronic pleural effusions and a  possible history of trauma, inflammation, or prior asbestos exposure. No pneumothorax. UPPER ABDOMEN: No acute abnormality within the visualized upper abdomen. Thickening of the adrenal glands is identified, in keeping with changes of adrenal hyperplasia. SOFT TISSUES AND BONES: No acute bone abnormality. Superimposed moderate atherosclerotic calcification within the thoracic aorta. 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 Ascending and distal descending thoracic aorta are of normal caliber. IMPRESSION: 1. No evidence of pulmonary embolism. 2. 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 Recommend annual follow with CT or MR arteriography for further surveillance if clinically indicated. 3. Stable rounded atelectasis in the left lower lobe and developing rounded atelectasis in the right lower lobe. 4. Complex small bilateral pleural effusions with associated pleural thickening and calcifications, stable since prior examination. See differential considerations above. Electronically signed by: Dorethia Molt MD 01/20/2024 10:33 PM EDT RP Workstation: [id number]   DG Chest 2 View Result Date: 01/20/2024 CLINICAL DATA:  Shortness of breath EXAM: CHEST - 2 VIEW COMPARISON:  Chest x-ray 01/06/2024.  CT of the chest 01/06/2024. FINDINGS: Right-sided central venous catheter tip ends in the distal SVC. Sternotomy wires and fixation devices are present. The cardiomediastinal silhouette is within normal limits. Small left pleural effusion is seen, possibly partially loculated posteriorly. Right lung appears clear. Calcified pleural plaques are again seen in the upper lungs. No pneumothorax or acute fracture. Degenerative changes affect the shoulders. IMPRESSION: 1. Small left pleural effusion, possibly partially loculated  posteriorly. 2. 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 Electronically Signed   By: Greig Pique M.D.   On: 01/20/2024 17:46    Cardiac Studies   N/A  Assessment   Principal Problem:   Chest pain Active Problems:   Hyperlipidemia associated with type 2 diabetes mellitus   CAD (coronary artery disease)   ESRD (end stage renal disease) (HCC)   Sinus tachycardia   Plan   Chest pain is likely musculoskeletal - low suspicion for cardiac pain with numerous flat, mildly elevated troponins. CT negative for PE or dissection. Pain improved with opiates. No plan for inpatient stress testing. May resume renal diet. Needs better pain control at discharge. Agree with adding metoprolol  given paroxysmal atrial flutter. He is not on anticoagulation d/t prior history of GIB.  Recommend follow-up with outpatient cardiology.  [facility] will sign off.   Medication Recommendations:  as above Other recommendations (labs, testing, etc):  none Follow up as an outpatient:  Dr. Ladona or APP   Time Spent Directly with Patient:  I have spent a total of 25 minutes with the patient reviewing hospital notes, telemetry, EKGs, labs and examining the patient as well as establishing an assessment and plan that was discussed personally with the patient.  > 50% of time was spent in direct patient care.  Length of Stay:  LOS: 0 days   Vinie KYM Maxcy, MD, [facility], FNLA, FACP  [place]  [facility]  Medical Director of the [facility]  [facility] Diplomate of the American Board of Clinical Lipidology Attending Cardiologist  Direct Dial : [phone]  Fax: [phone]  Website:  [email or web address]  Vinie JAYSON Maxcy  01/21/2024, 9:38 AM

## 2024-01-21 NOTE — Progress Notes (Signed)
  Received patient in bed to unit.   Informed consent signed and in chart.    TX duration:3.5     Transported by  Hand-off given to patient's nurse.    Access used: RT CVC  Access issues: None Dressing changed   Total UF removed: 0.5 Medication(s) given: None Post HD VS: 119/74      Hunter Hacking  LPN [facility]

## 2024-01-21 NOTE — Progress Notes (Signed)
 PROGRESS NOTE    Christian [name]  FMW:[id number] DOB: 02-Nov-1945 DOA: 01/20/2024 PCP: Rollene Almarie LABOR, MD  Subjective: Pt seen and examined in HD unit.  Cardiac enz flat. Not indicative of NSTEMI. Discussed with cards. No ischemic workup needed. No stress test needed. Pt can go home today. Discussed with nephrology that pt will need routine HD today.   Hospital Course: CC: SOB. CP. Sent from cardiology office HPI: Christian [name] is a 78 y.o. male with medical history significant of ESRD on HD MWF, anemia of chronic disease, CAD status post LHC with DES/PCI to PDA in 2015 and NSTEMI 07/2019 with CABG x 4 and cardiac arrest due to tamponade, paroxysmal atrial flutter not on rate/rhythm control meds or anticoagulation, HFpEF, type 2 diabetes, chronic hypotension on midodrine , COPD, hyperlipidemia, mild dementia, multiple sclerosis not on treatment, BPH, history of PE.  Recent hospital admission 9/11-9/03/2024 for chest pain.  Patient did have new T wave changes on EKG but troponin flat.  He was evaluated by cardiology and his chest pain was felt to be noncardiac in etiology.  Patient had a follow-up visit with cardiology yesterday and sent to the ED for further evaluation of intermittent left-sided chest pain with dyspnea and tachycardia.  EKG done at cardiology office showed no acute changes.  Cardiology felt that his symptoms may be exacerbated by dehydration and malnutrition although considering possible ischemic evaluation with Myoview  and possible reinitiation of beta-blocker.   Patient reports having intermittent left-sided sharp nonexertional chest pain associated with shortness of breath since yesterday 2 PM.  His chest pain started when he was at cardiology office.  He does not use home oxygen.  Reports improvement of his shortness of breath now and denies active chest pain at this time.  He reports poor p.o. intake but denies nausea, vomiting, abdominal pain, diarrhea, or constipation.   Denies missing any dialysis treatments.   ED Course: Patient noted to be in sinus tachycardia with heart rate ranging from low 100s to 150s.  EKG showing QT prolongation (QTc 609) but no acute ischemic changes in comparison to previous EKG from 2 weeks ago.  Afebrile and not hypotensive.  Oxygen saturation in the 80s on room air and placed on 2 L [place].  Labs showing no leukocytosis or anemia, potassium 3.5, bicarb 26, D-dimer 1.97, troponin 31> 29, BNP normal, magnesium  2.1.  CT angiogram chest negative for PE or pneumonia.  Showing stable rounded atelectasis in the left lower lobe and developing rounded atelectasis in the right lower lobe.  Also showing complex small bilateral pleural effusions with associated pleural thickening and calcifications, stable since prior examination.  Patient was given morphine  and 500 mL normal saline.  Significant Events: Admitted 01/20/2024 for chest pain   Admission Labs: WBC 7.9, HgB 15.3, plt 275 INR 0.9 D-dimer 1.97 Na 137, K 3.5, CO2 of 26, BUN 8, Scr 2.79, glu 80 Mg 2.1 BNP 91.5 TSH 1.918 Troponin I 31 >> 29  Admission Imaging Studies: CXR Small left pleural effusion, possibly partially loculated posteriorly. 2. 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 2. 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 Recommend annual follow with CT or MR arteriography for further surveillance if clinically indicated. 3. Stable rounded atelectasis in the left lower lobe and developing rounded atelectasis in the right lower lobe. 4. Complex small bilateral pleural effusions with associated pleural thickening and calcifications, stable since  prior examination. See differential considerations above.  Significant Labs:   Significant Imaging Studies:   Antibiotic Therapy: Anti-infectives (From admission, onward)    None        Procedures:   Consultants: Cardiology nephrology    Assessment and Plan: * Atypical chest pain 01/21/24 discussed with Dr. Mona with cards. Pt with non-ischemic, non-cardiac chest pain. He does not need stress test or further ischemic workup. Did discuss with pt's wife [name] that pt can take up to 975 mg of tylenol  3-4 times a day. She was only giving him 325-650 mg only once or twice a day(maximum). Discussed that he has musculoskeletal chest wall pain and that increased dose of tylenol  can be safely taken. His LFTs are normal.   Sinus tachycardia 01/21/24 stable. Cardiology added lopressor  25 mg bid  Atrial flutter (HCC) 01/21/24 stable. Cardiology added lopressor  25 mg bid  ESRD on dialysis [facility]) - M, W, F 01/21/24 consulted nephrology for routine HD today. Pt normally goes M, W, F. Chair time 10:30 AM. Pt can go home after HD today.   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   CAD (coronary artery disease) 01/21/24 stable. Cardiology added lopressor  25 mg bid.  discussed with Dr. Mona with cards. Pt with non-ischemic, non-cardiac chest pain. He does not need stress test or further ischemic workup. Did discuss with pt's wife [name] that pt can take up to 975 mg of tylenol  3-4 times a day. She was only giving him 325-650 mg only once or twice a day(maximum). Discussed that he has musculoskeletal chest wall pain and that increased dose of tylenol  can be safely taken. His LFTs are normal.   Type II diabetes mellitus 01/21/24 had some hypoglycemia this AM due to NPO status. Treated with orange juice. Hypoglycemia has resolved.    Essential hypertension 01/21/24 cards added lopressor  25 mg bid. Remains on isordil  30 mg tid. Midodrine  TID to prevent hypotension during HD.   Multiple sclerosis 01/21/24 chronic.   Hyperlipidemia associated with type 2 diabetes mellitus 01/21/24 stable. On lipitor.   Decubitus ulcer of left buttock, stage 2 (HCC) 01/21/24 present on  admission.      DVT prophylaxis: heparin  injection 5,000 Units Start: 01/21/24 0600    Code Status: Full Code Family Communication: talked with pt's wife [name] via phone Disposition Plan: home Reason for continuing need for hospitalization: stable for DC to home.  Objective: Vitals:   01/21/24 1256 01/21/24 1305 01/21/24 1330 01/21/24 1430  BP: 125/78 117/84 (!) 82/67 108/73  Pulse: 91 88 94 90  Resp: 14 18 (!) 26 (!) 25  Temp: (!) 97.4 F (36.3 C)     TempSrc:      SpO2:  100% 100% 100%  Weight:  62.5 kg    Height:        Intake/Output Summary (Last 24 hours) at 01/21/2024 1523 Last data filed at 01/20/2024 2245 Gross per 24 hour  Intake 500 ml  Output --  Net 500 ml   Filed Weights   01/21/24 0755 01/21/24 1305  Weight: 54.4 kg 62.5 kg    Examination:  Physical Exam Vitals and nursing note reviewed.  Constitutional:      General: He is not in acute distress.    Appearance: He is not toxic-appearing.     Comments: Appears chronically ill  HENT:     Head: Normocephalic and atraumatic.  Cardiovascular:     Rate and Rhythm: Normal rate and regular rhythm.  Pulmonary:     Effort: Pulmonary effort  is normal.     Breath sounds: Normal breath sounds.  Abdominal:     General: Abdomen is flat. Bowel sounds are normal.     Palpations: Abdomen is soft.  Musculoskeletal:     Right lower leg: No edema.     Left lower leg: No edema.     Comments: Right ant chest wall dialysis catheter  Skin:    General: Skin is warm and dry.     Capillary Refill: Capillary refill takes less than 2 seconds.  Neurological:     Mental Status: He is alert.    Data Reviewed: I have personally reviewed following labs and imaging studies  Cardiac Enzymes: Recent Labs  Lab 01/20/24 1937 01/20/24 2223  TROPONINIHS 31* 29*     CBC: Recent Labs  Lab 01/20/24 1644  WBC 7.9  HGB 15.3  HCT 47.4  MCV 99.8  PLT 275   Basic Metabolic Panel: Recent Labs  Lab 01/20/24 2011  01/21/24 0515  NA 137 138  K 3.5 3.1*  CL 94* 96*  CO2 26 28  GLUCOSE 80 69*  BUN 8 9  CREATININE 2.79* 3.13*  CALCIUM  9.1 8.8*  MG 2.1  --    GFR: Estimated Creatinine Clearance: 17.7 mL/min (A) (by C-G formula based on SCr of 3.13 mg/dL (H)). Liver Function Tests: Recent Labs  Lab 01/21/24 0515  AST 28  ALT 15  ALKPHOS 112  BILITOT 1.0  PROT 5.2*  ALBUMIN  1.9*   Coagulation Profile: Recent Labs  Lab 01/20/24 2011  INR 0.9   BNP (last 3 results) Recent Labs    01/20/24 2011  BNP 91.5   CBG: Recent Labs  Lab 01/21/24 0932 01/21/24 1002 01/21/24 1128 01/21/24 1221  GLUCAP 64* 99 68* 71   Thyroid  Function Tests: Recent Labs    01/21/24 0515  TSH 1.918   . Radiology Studies: CT Angio Chest PE W and/or Wo Contrast Result Date: 01/20/2024 EXAM: CTA CHEST 01/20/2024 10:07:48 PM TECHNIQUE: CTA of the chest was performed after the administration of intravenous contrast. Multiplanar reformatted images are provided for review. MIP images are provided for review. Automated exposure control, iterative reconstruction, and/or weight based adjustment of the mA/kV was utilized to reduce the radiation dose to as low as reasonably achievable. The study was performed with and without IV contrast, utilizing 65mL of iohexol  (OMNIPAQUE ) 350 MG/ML injection. COMPARISON: Comparison is made to studies dated 02/2024 and 12/12/2020. CLINICAL HISTORY: Pulmonary embolism (PE) suspected, low to intermediate prob, positive D-dimer. Patient presented with complaints of shortness of breath (SOB), was tachy and SOB while in office, and advised to come to ED for further evaluation. Patient has had decreased appetite and denies any pain. FINDINGS: PULMONARY ARTERIES: Pulmonary arteries are adequately opacified for evaluation. No acute pulmonary embolus. Central pulmonary arteries are of normal caliber. No intraluminal filling defect identified to suggest acute or chronic pulmonary embolism.  MEDIASTINUM: The heart and pericardium demonstrate no acute abnormality. Global cardiac size is within normal limits. No pericardial effusion. Status post coronary artery bypass grafting. Sternal nonunion noted. Right internal jugular hemodialysis catheter tip seen within the right atrium. LYMPH NODES: No mention of mediastinal, hilar, or axillary lymphadenopathy. LUNGS AND PLEURA: The lungs are not entirely without acute process. Rounded subpleural consolidation within the left lower lobe with associated marked volume loss is seen, in keeping with changes of rounded atelectasis, stable since a remote prior examination. 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 Stable subpleural consolidation  and associated volume loss within the posterobasal right lower lobe likely represent an additional focus of developing rounded atelectasis. Complex small bilateral pleural effusions with associated pleural thickening and calcifications are seen, suggesting chronic pleural effusions and a possible history of trauma, inflammation, or prior asbestos exposure. No pneumothorax. UPPER ABDOMEN: No acute abnormality within the visualized upper abdomen. Thickening of the adrenal glands is identified, in keeping with changes of adrenal hyperplasia. SOFT TISSUES AND BONES: No acute bone abnormality. Superimposed moderate atherosclerotic calcification within the thoracic aorta. 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 Ascending and distal descending thoracic aorta are of normal caliber. IMPRESSION: 1. No evidence of pulmonary embolism. 2. 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 Recommend annual follow with CT or MR arteriography for further surveillance if clinically indicated. 3. Stable  rounded atelectasis in the left lower lobe and developing rounded atelectasis in the right lower lobe. 4. Complex small bilateral pleural effusions with associated pleural thickening and calcifications, stable since prior examination. See differential considerations above. Electronically signed by: Dorethia Molt MD 01/20/2024 10:33 PM EDT RP Workstation: [id number]   DG Chest 2 View Result Date: 01/20/2024 CLINICAL DATA:  Shortness of breath EXAM: CHEST - 2 VIEW COMPARISON:  Chest x-ray 01/06/2024.  CT of the chest 01/06/2024. FINDINGS: Right-sided central venous catheter tip ends in the distal SVC. Sternotomy wires and fixation devices are present. The cardiomediastinal silhouette is within normal limits. Small left pleural effusion is seen, possibly partially loculated posteriorly. Right lung appears clear. Calcified pleural plaques are again seen in the upper lungs. No pneumothorax or acute fracture. Degenerative changes affect the shoulders. IMPRESSION: 1. Small left pleural effusion, possibly partially loculated posteriorly. 2. 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 Electronically Signed   By: Greig Pique M.D.   On: 01/20/2024 17:46    Scheduled Meds:  atorvastatin   20 mg Oral QPM   calcitRIOL   0.25 mcg Oral Once per day on Monday Wednesday Friday   Chlorhexidine  Gluconate Cloth  6 each Topical Q0600   clopidogrel   75 mg Oral Daily   fluticasone  furoate-vilanterol  1 puff Inhalation Daily   heparin   5,000 Units Subcutaneous Q8H   insulin  aspart  0-6 Units Subcutaneous Q4H   metoprolol  tartrate  25 mg Oral BID   midodrine   20 mg Oral TID WC   Continuous Infusions:   LOS: 0 days   Time spent: 55 minutes  Camellia Door, DO  [facility]  01/21/2024, 3:23 PM

## 2024-01-21 NOTE — Subjective & Objective (Addendum)
 Pt seen and examined in HD unit.  Cardiac enz flat. Not indicative of NSTEMI. Discussed with cards. No ischemic workup needed. No stress test needed. Pt can go home today. Discussed with nephrology that pt will need routine HD today.

## 2024-01-21 NOTE — Assessment & Plan Note (Signed)
 01/21/24 cards added lopressor  25 mg bid. Remains on isordil  30 mg tid. Midodrine  TID to prevent hypotension during HD.

## 2024-01-21 NOTE — Progress Notes (Signed)
 D/C order noted. Contacted [facility] to be advised of pt's d/c today and that pt should resume care on Monday.   Randine Mungo Dialysis Navigator [phone]

## 2024-01-21 NOTE — Discharge Planning (Signed)
 [facility] Patient Discharge Orders - [facility] CLINIC: [place]  Patient's name: Christian [name] Admit/DC Dates: 01/20/2024 - 01/21/24  DISCHARGE DIAGNOSES: Chest pain - negative for ACS  Dyspnea  HD ORDER CHANGES: Heparin  change: no EDW Change: no  [place] Change: not yet  --but pls check K weekly x 1 mo - may need to move to 3K  ANEMIA MANAGEMENT: Aranesp : Given: no    ESA dose for discharge: same as prior IV Iron dose at discharge: same as prior Transfusion: Given: no  BONE/MINERAL MEDICATIONS: Hectorol/Calcitriol  change: no Sensipar/Parsabiv change: no  ACCESS INTERVENTION/CHANGE: no Details:  RECENT LABS: Recent Labs  Lab 01/20/24 1644 01/20/24 2011 01/21/24 0515  HGB 15.3  --   --   NA  --    < > 138  K  --    < > 3.1*  CALCIUM   --    < > 8.8*  ALBUMIN   --   --  1.9*   < > = values in this interval not displayed.    IV ANTIBIOTICS: no Details:  OTHER ANTICOAGULATION: no Details:  OTHER/APPTS/LAB ORDERS:   D/C Meds to be reconciled by nurse after every discharge.  Completed By: Izetta Boehringer, PA-C [facility] Pager [phone]   Reviewed by: MD:______ RN_______

## 2024-01-21 NOTE — ED Notes (Signed)
 Pt having difficulty swallowing pills and water . Burping after every swallow. MD notified.

## 2024-01-21 NOTE — Assessment & Plan Note (Signed)
 01/21/24 consulted nephrology for routine HD today. Pt normally goes M, W, F. Chair time 10:30 AM. Pt can go home after HD today.

## 2024-01-21 NOTE — Discharge Summary (Signed)
 [facility] Physician Discharge Summary   Patient name: Christian [name]  Admit date:     01/20/2024  Discharge date: 01/21/2024  Attending Physician: RATHORE, VASUNDHRA [[id number]]  Discharge Physician: Camellia Door   PCP: Rollene Almarie LABOR, MD  Admitted From: Home  Disposition:  Home  Recommendations for Outpatient Follow-up:  Follow up with PCP in 1-2 weeks Follow up with routine dialysis on M, W, F  Home Health:No Equipment/Devices: None  Discharge Condition:Stable CODE STATUS:FULL Diet recommendation: Renal/Diabetic Fluid Restriction: 1200 ml/day  Hospital Summary: CC: SOB. CP. Sent from cardiology office HPI: Christian [name] is a 78 y.o. male with medical history significant of ESRD on HD MWF, anemia of chronic disease, CAD status post LHC with DES/PCI to PDA in 2015 and NSTEMI 07/2019 with CABG x 4 and cardiac arrest due to tamponade, paroxysmal atrial flutter not on rate/rhythm control meds or anticoagulation, HFpEF, type 2 diabetes, chronic hypotension on midodrine , COPD, hyperlipidemia, mild dementia, multiple sclerosis not on treatment, BPH, history of PE.  Recent hospital admission 9/11-9/03/2024 for chest pain.  Patient did have new T wave changes on EKG but troponin flat.  He was evaluated by cardiology and his chest pain was felt to be noncardiac in etiology.  Patient had a follow-up visit with cardiology yesterday and sent to the ED for further evaluation of intermittent left-sided chest pain with dyspnea and tachycardia.  EKG done at cardiology office showed no acute changes.  Cardiology felt that his symptoms may be exacerbated by dehydration and malnutrition although considering possible ischemic evaluation with Myoview  and possible reinitiation of beta-blocker.   Patient reports having intermittent left-sided sharp nonexertional chest pain associated with shortness of breath since yesterday 2 PM.  His chest pain started when he was at cardiology office.  He does  not use home oxygen.  Reports improvement of his shortness of breath now and denies active chest pain at this time.  He reports poor p.o. intake but denies nausea, vomiting, abdominal pain, diarrhea, or constipation.  Denies missing any dialysis treatments.   ED Course: Patient noted to be in sinus tachycardia with heart rate ranging from low 100s to 150s.  EKG showing QT prolongation (QTc 609) but no acute ischemic changes in comparison to previous EKG from 2 weeks ago.  Afebrile and not hypotensive.  Oxygen saturation in the 80s on room air and placed on 2 L .  Labs showing no leukocytosis or anemia, potassium 3.5, bicarb 26, D-dimer 1.97, troponin 31> 29, BNP normal, magnesium  2.1.  CT angiogram chest negative for PE or pneumonia.  Showing stable rounded atelectasis in the left lower lobe and developing rounded atelectasis in the right lower lobe.  Also showing complex small bilateral pleural effusions with associated pleural thickening and calcifications, stable since prior examination.  Patient was given morphine  and 500 mL normal saline.  Significant Events: Admitted 01/20/2024 for chest pain   Admission Labs: WBC 7.9, HgB 15.3, plt 275 INR 0.9 D-dimer 1.97 Na 137, K 3.5, CO2 of 26, BUN 8, Scr 2.79, glu 80 Mg 2.1 BNP 91.5 TSH 1.918 Troponin I 31 >> 29  Admission Imaging Studies: CXR Small left pleural effusion, possibly partially loculated posteriorly. 2. 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 2. 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 Recommend annual follow with CT or MR arteriography for further surveillance if clinically indicated. 3. Stable rounded atelectasis in the  left lower lobe and developing rounded atelectasis in the right lower lobe. 4. Complex small bilateral pleural effusions with associated pleural thickening and  calcifications, stable since prior examination. See differential considerations above.  Significant Labs:   Significant Imaging Studies:   Antibiotic Therapy: Anti-infectives (From admission, onward)    None       Procedures:   Consultants: Cardiology nephrology   Hospital Course by Problem: * Atypical chest pain 01/21/24 discussed with Dr. Mona with cards. Pt with non-ischemic, non-cardiac chest pain. He does not need stress test or further ischemic workup. Did discuss with pt's wife Christian [name] that pt can take up to 975 mg of tylenol  3-4 times a day. She was only giving him 325-650 mg only once or twice a day(maximum). Discussed that he has musculoskeletal chest wall pain and that increased dose of tylenol  can be safely taken. His LFTs are normal.   Sinus tachycardia 01/21/24 stable. Cardiology added lopressor  25 mg bid  Atrial flutter (HCC) 01/21/24 stable. Cardiology added lopressor  25 mg bid  ESRD on dialysis (HCC) - M, W, F 01/21/24 consulted nephrology for routine HD today. Pt normally goes M, W, F. Chair time 10:30 AM. Pt can go home after HD today.   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   CAD (coronary artery disease) 01/21/24 stable. Cardiology added lopressor  25 mg bid.  discussed with Dr. Mona with cards. Pt with non-ischemic, non-cardiac chest pain. He does not need stress test or further ischemic workup. Did discuss with pt's wife Christian [name] that pt can take up to 975 mg of tylenol  3-4 times a day. She was only giving him 325-650 mg only once or twice a day(maximum). Discussed that he has musculoskeletal chest wall pain and that increased dose of tylenol  can be safely taken. His LFTs are normal.   Type II diabetes mellitus 01/21/24 had some hypoglycemia this AM due to NPO status. Treated with orange juice. Hypoglycemia has resolved.    Essential hypertension 01/21/24 cards added lopressor  25 mg bid. Remains on isordil  30 mg tid. Midodrine  TID to prevent  hypotension during HD.   Multiple sclerosis 01/21/24 chronic.   Hyperlipidemia associated with type 2 diabetes mellitus 01/21/24 stable. On lipitor.   Decubitus ulcer of left buttock, stage 2 (HCC) 01/21/24 present on admission.       Discharge Diagnoses:  Principal Problem:   Atypical chest pain Active Problems:   ESRD on dialysis (HCC) - M, W, F   Atrial flutter (HCC)   Sinus tachycardia   Hyperlipidemia associated with type 2 diabetes mellitus   Multiple sclerosis   Essential hypertension   Type II diabetes mellitus   CAD (coronary artery disease)   Wheelchair dependent   Decubitus ulcer of left buttock, stage 2 [facility])   Discharge Instructions  Discharge Instructions     Call MD for:  difficulty breathing, headache or visual disturbances   Complete by: As directed    Call MD for:  extreme fatigue   Complete by: As directed    Call MD for:  hives   Complete by: As directed    Call MD for:  persistant dizziness or light-headedness   Complete by: As directed    Call MD for:  persistant nausea and vomiting   Complete by: As directed    Call MD for:  redness, tenderness, or signs of infection (pain, swelling, redness, odor or green/yellow discharge around incision site)   Complete by: As directed    Call MD for:  severe uncontrolled pain  Complete by: As directed    Call MD for:  temperature >100.4   Complete by: As directed    Diet renal/carb modified with fluid restriction   Complete by: As directed    30 ounces per day fluid restriction   Discharge instructions   Complete by: As directed    1. Follow up with your primary care provider in 1-2 weeks following discharge from hospital. 2. Continue with your routine dialysis treatments on Mondays, Wednesdays and Fridays as previously scheduled. 3. You make take 975 mg of tylenol  as needed every 6 hours for pain(that is three tablets of 325 mg strength).   Discharge wound care:   Complete by: As directed     Keep left buttock wound clean and dry. Keep covered with bandage.   Increase activity slowly   Complete by: As directed       Allergies as of 01/21/2024   No Known Allergies      Medication List     TAKE these medications    acetaminophen  325 MG tablet Commonly known as: TYLENOL  Take 3 tablets (975 mg total) by mouth every 6 (six) hours as needed for mild pain (pain score 1-3), fever or headache. What changed: how much to take   atorvastatin  20 MG tablet Commonly known as: LIPITOR Take 1 tablet (20 mg total) by mouth every evening.   budesonide -formoterol  160-4.5 MCG/ACT inhaler Commonly known as: SYMBICORT  INHALE 2 PUFFS INTO THE LUNGS TWICE A DAY   calcitRIOL  0.25 MCG capsule Commonly known as: ROCALTROL  Take 0.25 mcg by mouth 3 (three) times a week. Take one capsule at dinner after on Mon-Wed-Fri.   clopidogrel  75 MG tablet Commonly known as: PLAVIX  Take 1 tablet (75 mg total) by mouth daily.   diphenhydrAMINE  25 mg capsule Commonly known as: BENADRYL  Take 1 capsule (25 mg total) by mouth every 6 (six) hours as needed for itching.   docusate sodium  100 MG capsule Commonly known as: Colace Take 1 capsule (100 mg total) by mouth 2 (two) times daily.   ferrous sulfate  325 (65 FE) MG tablet Take 325 mg by mouth daily with breakfast.   isosorbide  dinitrate 30 MG tablet Commonly known as: ISORDIL  Take 30 mg by mouth 3 (three) times daily.   lidocaine  5 % Commonly known as: Lidoderm  Place 1 patch onto the skin daily. Remove & Discard patch within 12 hours or as directed by MD   metoprolol  tartrate 25 MG tablet Commonly known as: LOPRESSOR  Take 1 tablet (25 mg total) by mouth 2 (two) times daily.   midodrine  10 MG tablet Commonly known as: PROAMATINE  Take 2 tablets (20 mg total) by mouth 3 (three) times daily with meals.   multivitamin Tabs tablet Take 1 tablet by mouth at bedtime.   senna-docusate 8.6-50 MG tablet Commonly known as: Senokot-S Take 1  tablet by mouth 2 (two) times daily.               Discharge Care Instructions  (From admission, onward)           Start     Ordered   01/21/24 0000  Discharge wound care:       Comments: Keep left buttock wound clean and dry. Keep covered with bandage.   01/21/24 1450            No Known Allergies  Discharge Exam: Vitals:   01/21/24 1330 01/21/24 1430  BP: (!) 82/67 108/73  Pulse: 94 90  Resp: (!) 26 (!) 25  Temp:    SpO2: 100% 100%    Physical Exam Vitals and nursing note reviewed.  Constitutional:      General: He is not in acute distress.    Appearance: He is not toxic-appearing.     Comments: Appears chronically ill  HENT:     Head: Normocephalic and atraumatic.  Cardiovascular:     Rate and Rhythm: Normal rate and regular rhythm.  Pulmonary:     Effort: Pulmonary effort is normal.     Breath sounds: Normal breath sounds.  Abdominal:     General: Abdomen is flat. Bowel sounds are normal.     Palpations: Abdomen is soft.  Musculoskeletal:     Right lower leg: No edema.     Left lower leg: No edema.     Comments: Right ant chest wall dialysis catheter  Skin:    General: Skin is warm and dry.     Capillary Refill: Capillary refill takes less than 2 seconds.  Neurological:     Mental Status: He is alert.     The results of significant diagnostics from this hospitalization (including imaging, microbiology, ancillary and laboratory) are listed below for reference.     Labs:  Cardiac Enzymes: Recent Labs  Lab 01/20/24 1937 01/20/24 2223  TROPONINIHS 31* 29*    BNP (last 3 results) Recent Labs    01/20/24 2011  BNP 91.5   Basic Metabolic Panel: Recent Labs  Lab 01/20/24 2011 01/21/24 0515  NA 137 138  K 3.5 3.1*  CL 94* 96*  CO2 26 28  GLUCOSE 80 69*  BUN 8 9  CREATININE 2.79* 3.13*  CALCIUM  9.1 8.8*  MG 2.1  --    Liver Function Tests: Recent Labs  Lab 01/21/24 0515  AST 28  ALT 15  ALKPHOS 112  BILITOT 1.0   PROT 5.2*  ALBUMIN  1.9*   CBC: Recent Labs  Lab 01/20/24 1644  WBC 7.9  HGB 15.3  HCT 47.4  MCV 99.8  PLT 275   BNP: Recent Labs  Lab 01/20/24 2011  BNP 91.5   CBG: Recent Labs  Lab 01/21/24 0932 01/21/24 1002 01/21/24 1128 01/21/24 1221  GLUCAP 64* 99 68* 71   D-Dimer Recent Labs    01/20/24 1937  DDIMER 1.97*   Thyroid  function studies Recent Labs    01/21/24 0515  TSH 1.918   Urinalysis    Component Value Date/Time   COLORURINE AMBER (A) 09/23/2023 1232   APPEARANCEUR CLOUDY (A) 09/23/2023 1232   LABSPEC 1.025 09/23/2023 1232   PHURINE 5.0 09/23/2023 1232   GLUCOSEU NEGATIVE 09/23/2023 1232   HGBUR SMALL (A) 09/23/2023 1232   BILIRUBINUR NEGATIVE 09/23/2023 1232   KETONESUR NEGATIVE 09/23/2023 1232   PROTEINUR 100 (A) 09/23/2023 1232   NITRITE NEGATIVE 09/23/2023 1232   LEUKOCYTESUR MODERATE (A) 09/23/2023 1232   Sepsis Labs Recent Labs  Lab 01/20/24 1644  WBC 7.9    Procedures/Studies: CT Angio Chest PE W and/or Wo Contrast Result Date: 01/20/2024 EXAM: CTA CHEST 01/20/2024 10:07:48 PM TECHNIQUE: CTA of the chest was performed after the administration of intravenous contrast. Multiplanar reformatted images are provided for review. MIP images are provided for review. Automated exposure control, iterative reconstruction, and/or weight based adjustment of the mA/kV was utilized to reduce the radiation dose to as low as reasonably achievable. The study was performed with and without IV contrast, utilizing 65mL of iohexol  (OMNIPAQUE ) 350 MG/ML injection. COMPARISON: Comparison is made to studies dated 02/2024 and 12/12/2020. CLINICAL HISTORY: Pulmonary  embolism (PE) suspected, low to intermediate prob, positive D-dimer. Patient presented with complaints of shortness of breath (SOB), was tachy and SOB while in office, and advised to come to ED for further evaluation. Patient has had decreased appetite and denies any pain. FINDINGS: PULMONARY ARTERIES:  Pulmonary arteries are adequately opacified for evaluation. No acute pulmonary embolus. Central pulmonary arteries are of normal caliber. No intraluminal filling defect identified to suggest acute or chronic pulmonary embolism. MEDIASTINUM: The heart and pericardium demonstrate no acute abnormality. Global cardiac size is within normal limits. No pericardial effusion. Status post coronary artery bypass grafting. Sternal nonunion noted. Right internal jugular hemodialysis catheter tip seen within the right atrium. LYMPH NODES: No mention of mediastinal, hilar, or axillary lymphadenopathy. LUNGS AND PLEURA: The lungs are not entirely without acute process. Rounded subpleural consolidation within the left lower lobe with associated marked volume loss is seen, in keeping with changes of rounded atelectasis, stable since a remote prior examination. 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 Stable subpleural consolidation and associated volume loss within the posterobasal right lower lobe likely represent an additional focus of developing rounded atelectasis. Complex small bilateral pleural effusions with associated pleural thickening and calcifications are seen, suggesting chronic pleural effusions and a possible history of trauma, inflammation, or prior asbestos exposure. No pneumothorax. UPPER ABDOMEN: No acute abnormality within the visualized upper abdomen. Thickening of the adrenal glands is identified, in keeping with changes of adrenal hyperplasia. SOFT TISSUES AND BONES: No acute bone abnormality. Superimposed moderate atherosclerotic calcification within the thoracic aorta. 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 Ascending and distal descending thoracic aorta are of normal caliber. IMPRESSION: 1. No evidence of pulmonary embolism. 2. Mild dilation of the proximal  descending thoracic aorta measuring 3.2 cm in diameter with superimposed moderate atherosclerotic calcification. Recommend annual follow with CT or MR arteriography for further surveillance if clinically indicated. 3. Stable rounded atelectasis in the left lower lobe and developing rounded atelectasis in the right lower lobe. 4. Complex small bilateral pleural effusions with associated pleural thickening and calcifications, stable since prior examination. See differential considerations above. Electronically signed by: Dorethia Molt MD 01/20/2024 10:33 PM EDT RP Workstation: [id number]   DG Chest 2 View Result Date: 01/20/2024 CLINICAL DATA:  Shortness of breath EXAM: CHEST - 2 VIEW COMPARISON:  Chest x-ray 01/06/2024.  CT of the chest 01/06/2024. FINDINGS: Right-sided central venous catheter tip ends in the distal SVC. Sternotomy wires and fixation devices are present. The cardiomediastinal silhouette is within normal limits. Small left pleural effusion is seen, possibly partially loculated posteriorly. Right lung appears clear. Calcified pleural plaques are again seen in the upper lungs. No pneumothorax or acute fracture. Degenerative changes affect the shoulders. IMPRESSION: 1. Small left pleural effusion, possibly partially loculated posteriorly. 2. 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 Electronically Signed   By: Greig Pique M.D.   On: 01/20/2024 17:46   CT Angio Chest PE W and/or Wo Contrast Result Date: 01/06/2024 CLINICAL DATA:  High probability for PE. EXAM: CT ANGIOGRAPHY CHEST WITH CONTRAST TECHNIQUE: Multidetector CT imaging of the chest was performed using the standard protocol during bolus administration of intravenous contrast. Multiplanar CT image reconstructions and MIPs were obtained to evaluate the vascular anatomy. RADIATION DOSE REDUCTION: This exam was performed according to the departmental dose-optimization program which includes automated exposure control, adjustment of the  mA and/or kV according to  patient size and/or use of iterative reconstruction technique. CONTRAST:  60mL OMNIPAQUE  IOHEXOL  350 MG/ML SOLN COMPARISON:  CT of the chest 12/12/2020 FINDINGS: Cardiovascular: Satisfactory opacification of the pulmonary arteries to the segmental level. No evidence of pulmonary embolism. Normal heart size. No pericardial effusion. There are atherosclerotic calcifications of the aorta. Right-sided central venous catheter tip ends in the right atrium. Mediastinum/Nodes: No enlarged mediastinal, hilar, or axillary lymph nodes. Thyroid  gland, trachea, and esophagus demonstrate no significant findings. Lungs/Pleura: There are chronic appearing small bilateral pleural effusions, slightly decreased on the left and increased on the right. Smooth pleural thickening and calcifications are again seen. There are areas of rounded atelectasis in the bilateral lower lobes. This is unchanged in the left lower lobe and new in the right lower lobe. There stable scarring or atelectasis in the medial right upper lobe as well. Upper Abdomen: Bilateral adrenal nodularity and thickening is unchanged. There is a small right renal cysts, partially imaged. Musculoskeletal: No chest wall abnormality. No acute or significant osseous findings. Review of the MIP images confirms the above findings. IMPRESSION: 1. No evidence for pulmonary embolism. 2. Chronic small bilateral pleural effusions, slightly decreased on the left and increased on the right. 3. Stable rounded atelectasis in the left lower lobe. 4. New rounded atelectasis in the right lower lobe. 5. Stable bilateral calcified pleural plaques. Aortic Atherosclerosis (ICD10-I70.0). Electronically Signed   By: Greig Pique M.D.   On: 01/06/2024 16:56   DG Chest Portable 1 View Result Date: 01/06/2024 CLINICAL DATA:  Acute chest pain. EXAM: PORTABLE CHEST 1 VIEW COMPARISON:  Sep 23, 2023 FINDINGS: There is stable right-sided venous catheter positioning.  Radiopaque sternal fixation plates are seen. The heart size and mediastinal contours are within normal limits. A stable 6 mm nodular opacity is seen overlying the upper right. Mild atelectasis and/or scarring is seen within the left lung base. There is a small left pleural effusion. No pneumothorax is identified. No acute osseous abnormality is identified. IMPRESSION: 1. Mild left basilar atelectasis and/or scarring. 2. Small left pleural effusion. Electronically Signed   By: Suzen Dials M.D.   On: 01/06/2024 13:07    Time coordinating discharge: 60 mins  SIGNED:  Camellia Door, DO [facility] 01/21/24, 3:25 PM

## 2024-01-21 NOTE — ED Provider Notes (Signed)
 I was made aware of patient while he has been awaiting admission Patient was sent by cardiology to be admitted.  Patient had a thorough and extensive evaluation, PE has been ruled out However he does have runs of narrow tachycardia of unclear etiology.  Could be atrial flutter as he has had episodes of a-flutter previously related to postoperative course  Patient currently resting comfortably, heart rate around 100 and he is on oxygen but is in no distress  I did consult cardiology Dr. Cesario We discussed the case and she is reviewed EKG strips Since rhythms are transient, no indication for treatment at this time.  However if it becomes consistent we could consider therapy    Midge Golas, MD 01/21/24 [phone]

## 2024-01-21 NOTE — Assessment & Plan Note (Signed)
 01/21/24 stable. On lipitor.

## 2024-01-22 ENCOUNTER — Telehealth (HOSPITAL_COMMUNITY): Payer: Self-pay | Admitting: Nephrology

## 2024-01-22 NOTE — Telephone Encounter (Signed)
 Transition of care contact from inpatient facility  Date of Discharge: 01/21/2024 Date of Contact: 01/22/2024 - attempt #1 Method of contact: Phone  Attempted to contact patient to discuss transition of care from inpatient admission. Patient did not answer the phone. Message was left on the patient's voicemail with call back number [phone].  Izetta Boehringer, PA-C [employer] Pager [phone]

## 2024-01-25 DIAGNOSIS — N179 Acute kidney failure, unspecified: Secondary | ICD-10-CM | POA: Diagnosis not present

## 2024-02-01 ENCOUNTER — Ambulatory Visit: Payer: Self-pay | Admitting: Pulmonary Disease

## 2024-02-01 DIAGNOSIS — J683 Other acute and subacute respiratory conditions due to chemicals, gases, fumes and vapors: Secondary | ICD-10-CM

## 2024-02-01 MED ORDER — FLUTICASONE PROPIONATE 50 MCG/ACT NA SUSP: 2.0000 | Freq: Every day | NASAL | 0 refills | Status: DC

## 2024-02-01 MED ORDER — ALBUTEROL SULFATE HFA 108 (90 BASE) MCG/ACT IN AERS: 1.0000 | INHALATION_SPRAY | RESPIRATORY_TRACT | 0 refills | Status: DC | PRN

## 2024-02-01 MED ORDER — BUDESONIDE-FORMOTEROL FUMARATE 160-4.5 MCG/ACT IN AERO: 2.0000 | INHALATION_SPRAY | Freq: Two times a day (BID) | RESPIRATORY_TRACT | 0 refills | Status: DC

## 2024-02-01 NOTE — Telephone Encounter (Addendum)
 [phone] [name]--[facility]  Havent been using the inhalers as prescribed Not on home oxygen 95% on room air Got out of hospital on the 26th --no changes or worsening of condition since discharge from the hospital Home Health Nurse requesting clarifications on Christian pulmonary medications and a follow up appointment for Christian [name]. This RN did not see any openings available. Attempted to call [facility] but no answer  FYI Only or Action Required?: Action required by provider: request for appointment, clinical question for provider, and update on Christian [name] condition.  Christian [name] is followed in Pulmonology for restrictive lung disease, last seen on 12/09/2022 by Kara Dorn NOVAK, MD.  Called Nurse Triage reporting Shortness of Breath.  Symptoms began several months ago.  Interventions attempted: Other: multiple ER visits.  Symptoms are: unchanged.  Triage Disposition: Call PCP When Office is Open  Christian [name]/caregiver understands and will follow disposition?: Yes            Copied from CRM #[id number]. Topic: Clinical - Red Word Triage >> Feb 01, 2024  9:28 AM Celestine FALCON wrote: Red Word that prompted transfer to Nurse Triage: [name] pt's home health nurse is calling stating he has been having SOB recently causing him to be seen at the ED multiple times in September (9/11 & 9/25). She stated there's some confusion on which medications he needs to be taking daily versus in emergency situations and she would like to clarify this info with a nurse. Reason for Disposition  [1] Caller requesting NON-URGENT health information AND [2] PCP's office is the best resource  Answer Assessment - Initial Assessment Questions [phone] [name]--[facility] At [facility] with Christian [name] and Christian [name]    Symbicort  inhaler--INHALE 2 PUFFS INTO THE LUNGS TWICE A DAY   Havent been using the inhalers as prescribed for months now Not on home oxygen 95% on room  air Got out of hospital on the 26th  [facility] Nurse [name] states that the Christian [name] has had multiple hospital visits and it seems to be due to breathing concerns each time and whenever Christian [name] has gotten out of the hospital, his discharge paperwork did not have his maintenance medications listed so Christian [name] has not been using his Symbicort  for months now. They had Symbicort  and an Albuterol  inhaler on hand and this RN did not see Albuterol  on the active med list but it was noted in the last office visit: Use albuterol  inhaler 1-2 breaths every 4-6 hours as needed ---looking to ensure this is correct since it is not appearing on the active med list at this time for Christian [name]. Nurse obtained a pulse ox reading of 95% on room air. They advise that nothing has changed since Christian [name] was discharged from the hospital and Christian [name] is not worse. Offered an appointment with Christian pulmonologist Dr Kara for this afternoon but transportation is not available same day. They have to arrange for transportation. They are advised to call us  back with any questions/concerns/changes and also advised to go to the Emergency Room if any symptoms do return or get worse They advised understanding.  Protocols used: Information Only Call - No Triage-A-AH

## 2024-02-01 NOTE — Telephone Encounter (Signed)
 Called and spoke with [name] with [facility], I let her know what he was on the last time we saw him in August of 2024 (Symbicort , Albuterol , flonase ).  I verified his pharmacy and told her I would renew his medications once I got him scheduled for a follow up with one of our providers.  Scripts sent to his pharmacy for Symbicort , Albuterol  and flonase .  He is scheduled to see Madelin Stank NP on 10/16 at 11:30 am.  Advised he needs to arrive by 11:15 am for check in.  She verbalized understanding and said she would call his wife and let her know.  He does use transportation to get to appointments.  Nothing further needed.

## 2024-02-02 DIAGNOSIS — N179 Acute kidney failure, unspecified: Secondary | ICD-10-CM | POA: Diagnosis not present

## 2024-02-02 DIAGNOSIS — Z992 Dependence on renal dialysis: Secondary | ICD-10-CM | POA: Diagnosis not present

## 2024-02-02 DIAGNOSIS — N186 End stage renal disease: Secondary | ICD-10-CM | POA: Diagnosis not present

## 2024-02-02 DIAGNOSIS — R0602 Shortness of breath: Secondary | ICD-10-CM | POA: Diagnosis not present

## 2024-02-02 NOTE — Progress Notes (Unsigned)
 [id number] Office Note    CC: Need for new access. Requesting Provider:  Rollene Almarie LABOR, *  HPI: Christian [name] is a 78 y.o. (March 14, 1946) male presenting in follow-up with right-sided tunneled dialysis catheter having previously failed left arm brachiobasilic fistula creation -the fistula is patent, but flows remain low.  At the time of his last visit, Christian [name] was recovering from laparoscopic appendectomy for perforated appendicitis.  He was requiring a Nurse, adult and home oxygen.  We decided to table permanent dialysis discussions until he had significant improvement.  On exam today, Christian [name] was resting comfortably in his wheelchair, accompanied by his wife.  He was no longer wearing supplemental O2.  Stated he was still unable to walk, and was wheelchair dependent.  He is now home.  His wife was all smiles, and stated she thought he was slowly getting better.  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   Diabetic polyneuropathy    Elevated liver enzymes 08/09/2019   Hiatal hernia    History of coronary artery stent placement    History of pulmonary embolism 08/09/2019   HTN (hypertension) 11/12/2009   Hyperlipidemia associated with type 2 diabetes mellitus 11/12/2009   Left leg weakness 11/23/2018   Mild dementia, 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 LIMA TO LAD, RIMA TO PDA, RADIAL ARTERY TO CIRC AND SEQUENTIALLY TO OM1.   Sleep difficulties 05/30/2020   Type II diabetes mellitus 10/10/2014    Past Surgical History:  Procedure  Laterality Date   A/V FISTULAGRAM Left 08/12/2022   Procedure: A/V Fistulagram;  Surgeon: Lanis Christian [name] BRAVO, MD;  Location: [facility] INVASIVE CV LAB;  Service: Cardiovascular;  Laterality: Left;   A/V FISTULAGRAM N/A 09/07/2023   Procedure: A/V Fistulagram;  Surgeon: Serene Gaile ORN, MD;  Location: [facility] INVASIVE CV LAB;  Service: Cardiovascular;  Laterality: N/A;   APPLICATION OF WOUND VAC N/A 08/24/2019   Procedure: APPLICATION OF WOUND VAC;  Surgeon: German Bartlett PEDLAR, MD;  Location: [facility] OR;  Service: Thoracic;  Laterality: N/A;   APPLICATION OF WOUND VAC  08/29/2019   Procedure: Wound Vac change;  Surgeon: German Bartlett PEDLAR, MD;  Location: [facility] OR;  Service: Open Heart Surgery;;   APPLICATION OF WOUND VAC N/A 09/04/2019   Procedure: Application Of Wound Vac;  Surgeon: Army Dallas NOVAK, MD;  Location: [facility] OR;  Service: Open Heart Surgery;  Laterality: N/A;   APPLICATION OF WOUND VAC N/A 09/06/2019   Procedure: APPLICATION OF ACELL, APPLICATION OF WOUND VAC USING PREVENA INCISIONAL  DRESSING;  Surgeon: Lowery Estefana GORMAN, DO;  Location: [facility] OR;  Service: Plastics;  Laterality: N/A;   AV FISTULA PLACEMENT Left 09/18/2020   Procedure: ARTERIOVENOUS (AV) FISTULA CREATION LEFT VERSUS GRAFT;  Surgeon: Serene Gaile ORN, MD;  Location: [facility] OR;  Service: Vascular;  Laterality: Left;   BASCILIC VEIN TRANSPOSITION Left 10/31/2020   Procedure: LEFT SECOND STAGE BASCILIC VEIN TRANSPOSITION;  Surgeon: Serene Gaile ORN, MD;  Location: [facility] OR;  Service: Vascular;  Laterality: Left;   CARDIAC CATHETERIZATION     CORONARY ARTERY  BYPASS GRAFT N/A 08/15/2019   Procedure: CORONARY ARTERY BYPASS GRAFTING (CABG), x 4 using bilateral IMAs and left radial artery .  LIMA TO LAD, RIMA TO PDA, RADIAL ARTERY TO CIRC AND SEQUENTIALLY TO OM1.;  Surgeon: German Bartlett PEDLAR, MD;  Location: [facility] OR;  Service: Open Heart Surgery;  Laterality: N/A;   CORONARY STENT PLACEMENT  02/27/2014   distal rt/pd coronary       dr ladona   CYSTO/ BLADDER BIOPSY'S/  CAUTHERIZATION  01-14-2004  DR CHALES   EXPLORATION POST OPERATIVE OPEN HEART N/A 08/16/2019   Procedure: Chest Closure S?P CABG WITH APPLICATION OF PREVENA  INCISIONAL WOUND VAC;  Surgeon: German Bartlett PEDLAR, MD;  Location: [facility] OR;  Service: Open Heart Surgery;  Laterality: N/A;   EXPLORATION POST OPERATIVE OPEN HEART N/A 08/21/2019   Procedure: CHEST WASHOUT S/P OPEN CHEST;  Surgeon: German Bartlett PEDLAR, MD;  Location: [facility] OR;  Service: Open Heart Surgery;  Laterality: N/A;  Open chest with Esmark dressing with Ioban sealant coverage.   EXPLORATION POST OPERATIVE OPEN HEART N/A 08/18/2019   Procedure: EXPLORATION POST OPERATIVE OPEN HEART (performed 04/23 on unit);  Surgeon: German Bartlett PEDLAR, MD;  Location: [facility] OR;  Service: Open Heart Surgery;  Laterality: N/A;   EXPLORATION POST OPERATIVE OPEN HEART N/A 08/24/2019   Procedure: CHEST WASHOUT POST OPERATIVE OPEN HEART;  Surgeon: German Bartlett PEDLAR, MD;  [facility]: [facility] OR;  Service: Open Heart Surgery;  Laterality: N/A;   EXPLORATION POST OPERATIVE OPEN HEART N/A 08/29/2019   Procedure: CHEST WOUND WASHOUT POST OPERATIVE OPEN HEART;  Surgeon: German Bartlett PEDLAR, MD;  [facility];  Service: Open Heart Surgery;  Laterality: N/A;   EXPLORATION POST OPERATIVE OPEN HEART N/A 09/04/2019   Procedure: MEDIASTINAL EXPLORATION WITH STERNAL WOUND IRRIGATION;  Surgeon: Army Dallas NOVAK, MD;  Location: [facility] OR;  Service: Open Heart Surgery;  Laterality: N/A;   EXPLORATION POST OPERATIVE OPEN HEART N/A 09/14/2019   Procedure: EVACUATION OF HEMATOMA;  Surgeon: Army Dallas NOVAK, MD;  Location: [facility] OR;  Service: Open Heart Surgery;  Laterality: N/A;   INSERTION OF DIALYSIS CATHETER N/A 09/16/2023   Procedure: INSERTION OF DIALYSIS CATHETER;  Surgeon: Gretta Lonni PARAS, MD;  Location: [facility] OR;  Service: Vascular;  Laterality: N/A;  TUNNELED DIALYSIS CATHETER   IR FLUORO GUIDE CV LINE LEFT  09/19/2019   IR FLUORO GUIDE CV LINE RIGHT  09/08/2023   IR GASTROSTOMY TUBE MOD SED   10/04/2019   IR GASTROSTOMY TUBE REMOVAL  11/29/2019   IR PATIENT EVAL TECH 0-60 MINS  09/29/2019   IR US  GUIDE VASC ACCESS LEFT  09/19/2019   IR US  GUIDE VASC ACCESS RIGHT  09/08/2023   LAPAROSCOPIC APPENDECTOMY N/A 08/31/2023   Procedure: APPENDECTOMY, LAPAROSCOPIC;  Surgeon: Paola Dreama SAILOR, MD;  [facility]: [facility] OR;  Service: General;  Laterality: N/A;   LAPAROSCOPIC LYSIS OF ADHESIONS N/A 09/06/2019   Procedure: LAPAROSCOPIC OMENTAL HARVEST;  Surgeon: Stevie Herlene Righter, MD;  Location: [facility] OR;  Service: General;  Laterality: N/A;   LEFT HEART CATH AND CORONARY ANGIOGRAPHY N/A 08/10/2019   Procedure: LEFT HEART CATH AND CORONARY ANGIOGRAPHY;  Surgeon: Elmira Newman PARAS, MD;  Location: [facility] INVASIVE CV LAB;  Service: Cardiovascular;  Laterality: N/A;   LEFT HEART CATHETERIZATION WITH CORONARY ANGIOGRAM N/A 02/27/2014   Procedure: LEFT HEART CATHETERIZATION WITH CORONARY ANGIOGRAM;  Surgeon: Erick JONELLE ladona, MD;  Location: [facility] CATH LAB;  Service: Cardiovascular;  Laterality: N/A;   MEDIASTINAL EXPLORATION N/A 09/06/2019   Procedure: MEDIASTINAL EXPLORATION;  Surgeon: Army Dallas NOVAK, MD;  Location: [facility] OR;  Service: Thoracic;  Laterality: N/A;   PECTORALIS FLAP  09/06/2019   Procedure: Pectoralis ADVANCEMENT Flap;  Surgeon: Lowery Estefana RAMAN, DO;  Location: [facility] OR;  Service: Plastics;;   PERCUTANEOUS CORONARY STENT INTERVENTION (PCI-S)  02/27/2014   Procedure: PERCUTANEOUS CORONARY STENT INTERVENTION (PCI-S);  Surgeon: Erick JONELLE Bergamo, MD;  Location: [facility] CATH LAB;  Service: Cardiovascular;;  rt PDA  3.0/28mm Promus stent   PERIPHERAL VASCULAR INTERVENTION Left 08/12/2022   Procedure: PERIPHERAL VASCULAR INTERVENTION;  Surgeon: Lanis Christian [name] BRAVO, MD;  Location: [facility] INVASIVE CV LAB;  Service: Cardiovascular;  Laterality: Left;   RADIAL ARTERY HARVEST Left 08/15/2019   Procedure: Radial Artery Harvest;  Surgeon: German Bartlett PEDLAR, MD;  Location: [facility] OR;  Service: Open Heart Surgery;  Laterality: Left;    REMOVAL OF A HERO DEVICE Right 09/16/2023   Procedure: REMOVAL OF TEMPORARY TUNNELED HEMODIALYSIS CATHETER;  Surgeon: Gretta Lonni PARAS, MD;  Location: [facility] OR;  Service: Vascular;  Laterality: Right;   RIB PLATING N/A 09/06/2019   Procedure: STERNAL PLATING;  Surgeon: Army Dallas NOVAK, MD;  Location: [facility] OR;  Service: Thoracic;  Laterality: N/A;   TEE WITHOUT CARDIOVERSION N/A 08/15/2019   Procedure: TRANSESOPHAGEAL ECHOCARDIOGRAM (TEE);  Surgeon: German Bartlett PEDLAR, MD;  Location: [facility] OR;  Service: Open Heart Surgery;  Laterality: N/A;   TRACHEOSTOMY TUBE PLACEMENT  08/29/2019   Procedure: Tracheostomy;  Surgeon: German Bartlett PEDLAR, MD;  Location: [facility] OR;  Service: Open Heart Surgery; Decannulated 6/10/20211   TRANSURETHRAL RESECTION OF PROSTATE  04/04/2012   Procedure: TRANSURETHRAL RESECTION OF THE PROSTATE WITH GYRUS INSTRUMENTS;  Surgeon: Arlena LILLETTE Gal, MD;  Location: [facility];  Service: Urology;  Laterality: N/A;   TRANSURETHRAL RESECTION OF PROSTATE N/A 09/27/2014   Procedure: TRANSURETHRAL RESECTION OF THE PROSTATE ;  Surgeon: Arlena Gal, MD;  Location: [facility] ORS;  Service: Urology;  Laterality: N/A;   TUNNELLED CATHETER EXCHANGE N/A 09/07/2023   Procedure: TUNNELLED CATHETER EXCHANGE;  Surgeon: Serene Gaile ORN, MD;  Location: [facility] INVASIVE CV LAB;  Service: Cardiovascular;  Laterality: N/A;   ULTRASOUND GUIDANCE FOR VASCULAR ACCESS Right 09/16/2023   Procedure: ULTRASOUND GUIDANCE, FOR VASCULAR ACCESS;  Surgeon: Gretta Lonni PARAS, MD;  Location: [facility] OR;  Service: Vascular;  Laterality: Right;   UPPER GASTROINTESTINAL ENDOSCOPY      Social History   Socioeconomic History   Marital status: Married    Spouse name: Not on file   Number of children: 2   Years of education: 14   Highest education level: Associate degree: academic program  Occupational History   Occupation: Retired  Tobacco Use   Smoking status: Former    Current packs/day: 0.00    Average  packs/day: 0.5 packs/day for 20.0 years (10.0 ttl pk-yrs)    Types: Cigarettes    Start date: 35    Quit date: 1975    Years since quitting: 50.8   Smokeless tobacco: Never  Vaping Use   Vaping status: Never Used  Substance and Sexual Activity   Alcohol use: Not Currently    Comment: very rarely - every now and then 1 drink/1 year if that   Drug use: No   Sexual activity: Not Currently  Other Topics Concern   Not on file  Social History Narrative   Right handed   Dirnks caffeine prn   Married   Lives with wife   Social Drivers of Health   Financial Resource Strain: Low Risk  (07/23/2021)  Overall Financial Resource Strain (CARDIA)    Difficulty of Paying Living Expenses: Not hard at all  Food Insecurity: No Food Insecurity (01/21/2024)   Hunger Vital Sign    Worried About Running Out of Food in the Last Year: Never true    Ran Out of Food in the Last Year: Never true  Transportation Needs: No Transportation Needs (01/21/2024)   PRAPARE - Administrator, Civil Service (Medical): No    Lack of Transportation (Non-Medical): No  Physical Activity: Inactive (07/23/2021)   Exercise Vital Sign    Days of Exercise per Week: 0 days    Minutes of Exercise per Session: 0 min  Stress: No Stress Concern Present (07/23/2021)   [employer] of Occupational Health - Occupational Stress Questionnaire    Feeling of Stress : Not at all  Social Connections: Socially Integrated (01/21/2024)   Social Connection and Isolation Panel    Frequency of Communication with Friends and Family: More than three times a week    Frequency of Social Gatherings with Friends and Family: More than three times a week    Attends Religious Services: 1 to 4 times per year    Active Member of [employer] or Organizations: Yes    Attends Banker Meetings: 1 to 4 times per year    Marital Status: Married  Catering manager Violence: Not At Risk (01/21/2024)   Humiliation, Afraid, Rape, and Kick  questionnaire    Fear of Current or Ex-Partner: No    Emotionally Abused: No    Physically Abused: No    Sexually Abused: No   Family History  Problem Relation Age of Onset   Diabetes Mother    Hypertension Mother    Diabetes Father    Diabetes Brother    Hypertension Brother    Bone cancer Brother    Diabetes Brother     Current Outpatient Medications  Medication Sig Dispense Refill   acetaminophen  (TYLENOL ) 325 MG tablet Take 3 tablets (975 mg total) by mouth every 6 (six) hours as needed for mild pain (pain score 1-3), fever or headache.     albuterol  (VENTOLIN  HFA) 108 (90 Base) MCG/ACT inhaler Inhale 1-2 puffs into the lungs as needed for wheezing or shortness of breath. Every 4-6 hours as needed. 8 g 0   atorvastatin  (LIPITOR) 20 MG tablet Take 1 tablet (20 mg total) by mouth every evening. 90 tablet 2   budesonide -formoterol  (SYMBICORT ) 160-4.5 MCG/ACT inhaler Inhale 2 puffs into the lungs in the morning and at bedtime. INHALE 2 PUFFS INTO THE LUNGS TWICE A DAY. Rinse mouth, brush teeth/tongue after each use. 10.2 g 0   calcitRIOL  (ROCALTROL ) 0.25 MCG capsule Take 0.25 mcg by mouth 3 (three) times a week. Take one capsule at dinner after on Mon-Wed-Fri.     clopidogrel  (PLAVIX ) 75 MG tablet Take 1 tablet (75 mg total) by mouth daily. 90 tablet 2   diphenhydrAMINE  (BENADRYL ) 25 mg capsule Take 1 capsule (25 mg total) by mouth every 6 (six) hours as needed for itching.     docusate sodium  (COLACE) 100 MG capsule Take 1 capsule (100 mg total) by mouth 2 (two) times daily. 60 capsule 2   ferrous sulfate  325 (65 FE) MG tablet Take 325 mg by mouth daily with breakfast.     fluticasone  (FLONASE ) 50 MCG/ACT nasal spray Place 2 sprays into both nostrils daily. As needed for congestion. 16 g 0   isosorbide  dinitrate (ISORDIL ) 30 MG tablet Take 30 mg  by mouth 3 (three) times daily.     lidocaine  (LIDODERM ) 5 % Place 1 patch onto the skin daily. Remove & Discard patch within 12 hours or as  directed by MD 30 patch 0   metoprolol  tartrate (LOPRESSOR ) 25 MG tablet Take 1 tablet (25 mg total) by mouth 2 (two) times daily. 90 tablet 0   midodrine  (PROAMATINE ) 10 MG tablet Take 2 tablets (20 mg total) by mouth 3 (three) times daily with meals. 180 tablet 5   multivitamin (RENA-VIT) TABS tablet Take 1 tablet by mouth at bedtime.     senna-docusate (SENOKOT-S) 8.6-50 MG tablet Take 1 tablet by mouth 2 (two) times daily.     No current facility-administered medications for this visit.    No Known Allergies   REVIEW OF SYSTEMS:  [X]  denotes positive finding, [ ]  denotes negative finding Cardiac  Comments:  Chest pain or chest pressure:    Shortness of breath upon exertion:    Short of breath when lying flat:    Irregular heart rhythm:        Vascular    Pain in calf, thigh, or hip brought on by ambulation:    Pain in feet at night that wakes you up from your sleep:     Blood clot in your veins:    Leg swelling:         Pulmonary    Oxygen at home:    Productive cough:     Wheezing:         Neurologic    Sudden weakness in arms or legs:     Sudden numbness in arms or legs:     Sudden onset of difficulty speaking or slurred speech:    Temporary loss of vision in one eye:     Problems with dizziness:         Gastrointestinal    Blood in stool:     Vomited blood:         Genitourinary    Burning when urinating:     Blood in urine:        Psychiatric    Major depression:         Hematologic    Bleeding problems:    Problems with blood clotting too easily:        Skin    Rashes or ulcers:        Constitutional    Fever or chills:      PHYSICAL EXAMINATION:  There were no vitals filed for this visit.  General:  WDWN in NAD; vital signs documented above Gait: Not observed HENT: WNL, normocephalic Pulmonary: normal non-labored breathing , without wheezing Cardiac: regular HR Abdomen: soft, NT, no masses Skin: without rashes Vascular Exam/Pulses:   Right Left  Radial 2+ (normal) 2+ (normal)  Ulnar    Femoral    Popliteal    DP    PT     Extremities: without ischemic changes, without Gangrene , without cellulitis; without open wounds;  Excellent thrill in the left arm brachiobasilic fistula Musculoskeletal: no muscle wasting or atrophy  Neurologic: A&O X 3;  No focal weakness or paresthesias are detected Psychiatric:  The pt has Normal affect.   Non-Invasive Vascular Imaging:   Left arm brachiobasilic fistula occluded   ASSESSMENT/PLAN: Christian [name] is a 78 y.o. male presenting with left arm brachiobasilic fistula malfunction-failure to mature, for discussion regarding new permanent HD access.    Imaging demonstrates low flows within the AV fistula.  The access will be abandoned.  I had a long discussion with Christian [name] regarding the above, most notably that permanent dialysis access would likely involve an upper extremity graft which would be further surgery both he and his wife were not interested in surgery at this time, which I think is very reasonable with his recent medical history and hospitalizations.    At this point, with his comorbidities, I think it is very reasonable to continue dialysis through the tunneled dialysis catheter.  Should his overall functional status improve, I would happily AV graft placement. Both he and his wife were in agreement with this plan.  Christian [name] FORBES Rim, MD [facility] [phone]

## 2024-02-03 ENCOUNTER — Ambulatory Visit (HOSPITAL_COMMUNITY)
Admission: RE | Admit: 2024-02-03 | Discharge: 2024-02-03 | Disposition: A | Discharge status: Home / Self Care | Source: Ambulatory Visit | Attending: Vascular Surgery | Admitting: Vascular Surgery

## 2024-02-03 ENCOUNTER — Other Ambulatory Visit (HOSPITAL_COMMUNITY): Payer: Self-pay

## 2024-02-03 ENCOUNTER — Ambulatory Visit (HOSPITAL_COMMUNITY): Admission: RE | Admit: 2024-02-03 | Discharge: 2024-02-03 | Attending: Vascular Surgery

## 2024-02-03 ENCOUNTER — Ambulatory Visit: Attending: Vascular Surgery | Admitting: Vascular Surgery

## 2024-02-03 ENCOUNTER — Telehealth: Payer: Self-pay

## 2024-02-03 ENCOUNTER — Encounter: Payer: Self-pay | Admitting: Vascular Surgery

## 2024-02-03 VITALS — BP 68/49 | HR 59 | Temp 97.9°F | Resp 20 | Ht 69.0 in | Wt 137.0 lb

## 2024-02-03 DIAGNOSIS — N186 End stage renal disease: Secondary | ICD-10-CM

## 2024-02-03 DIAGNOSIS — Z992 Dependence on renal dialysis: Secondary | ICD-10-CM

## 2024-02-03 MED ORDER — ALBUTEROL SULFATE HFA 108 (90 BASE) MCG/ACT IN AERS
1.0000 | INHALATION_SPRAY | RESPIRATORY_TRACT | 0 refills | Status: DC | PRN
  Filled 2024-02-03: qty 6.7, 25d supply, fill #0

## 2024-02-03 NOTE — Telephone Encounter (Signed)
 Copied from CRM [id number]. Topic: Clinical - Home Health Verbal Orders >> Feb 03, 2024 10:07 AM Macario HERO wrote: Caller/Agency: Signe Edison from [facility]  Callback Number: [phone] Service Requested: Occupational Therapy Frequency: 1 week 1 Any new concerns about the patient? No

## 2024-02-04 ENCOUNTER — Telehealth: Payer: Self-pay

## 2024-02-04 NOTE — Telephone Encounter (Signed)
 Copied from CRM [id number]. Topic: Clinical - Medical Advice >> Feb 04, 2024 12:05 PM Mia F wrote: Reason for CRM: Pt wife called and states pt is currently at the Dialysis office and they would like to know if pt can get a flu shot. Called [facility] and was told everyone is at lunch. Please call wife back to let her know

## 2024-02-04 NOTE — Telephone Encounter (Signed)
 Attempted to return call at 12pm 02/04/24 no answer no VM.

## 2024-02-07 NOTE — Telephone Encounter (Signed)
Yes- ok for flu shot

## 2024-02-07 NOTE — Progress Notes (Unsigned)
 Cardiology Office Note:  .   Date:  02/08/2024  ID:  Christian [name], DOB 1946-02-17, MRN [id number] PCP: Rollene Almarie LABOR, MD  [facility] Providers Cardiologist:  Gordy Bergamo, MD {   History of Present Illness: .   Christian [name] is a 78 y.o. male  with PMHx of CAD (s/p LHC with DES to PDA in 2015 and NSTEMI 07/2019 with CABG x 4 and cardiac arrest due to tamponade),  HFimpEF/ICM (EF down to 30-35% in 07/2019 in setting of CAD with most recent 55-60% in 08/2023), ESRD on MWF, HLD, HTN, hx of PE in 09/2019, paroxysmal atrial flutter (not on AC due to recurrent GI bleed), right CVA, multiple sclerosis who reports to [facility] office for follow up.   Previous hospital admission 9/11-03/2024 for chest pain.  Noted new T wave changes on EKG but troponins flat.  Cardiology felt noncardiac in etiology.   Patient seen in follow-up appointment 01/20/2024 by Jackee Alberts, PA-C for further evaluation of intermittent left-sided chest pain with dyspnea and tachycardia.  EKG in office showed no acute changes.  Patient was sent to ED due to suspected symptoms exacerbated by dehydration and malnutrition. During hospitalization 9/25-26/2025, cardiology was consulted and noted nonischemic, noncardiac chest pain that did not require any further ischemic workup. Discussed MSK chest wall pain and increasing dose of Tylenol  up to 975 mg of Tylenol  3-4 times a day.  EKG showed sinus tachycardia. Negative for PE. Started on Lopressor  25 mg twice daily. Continued on Lipitor 20 mg daily, Plavix  75 mg, Isordil  30 mg TID, and Midodrine  20 mg TID.   Today, patient is a difficult historian and not willing to explain or answer all questions. He is accompanied by wife who assist with history. Reports being extremely tired since this morning and continues to say, Please let me lay down. Wife states this is how patient acts after dialysis day even though this is a non dialysis day. He has been complaint with Dialysis on  MWF.  He does not check BP at home.  Reports compliance with medications and has taken morning medications. Patient does not skip any medications the morning of dialysis. His daily nutrition is mainly 2 ensures daily, although 3 ensure was previously recommend, wife encourages but patient is not cooperative. Patient is wheelchair bound and require assistance with transfer. Patient does physical therapy x2 per week. Initially patient denied any recurrent chest pain since hospitalization but when asked again reported chronic unchanged chest pain similar to previous episodes but not able to provide any characteristics. Wife denies any known complaints of chest pain in the past 1-2 weeks. Denies any active chest pain, SOB, dizziness, presyncope, active bleeding, palpitations.   ROS: 10 point review of system has been reviewed and considered negative except ones been listed in the HPI.   Studies Reviewed: SABRA   EKG Interpretation Date/Time:  Tuesday February 08 2024 09:43:14 EDT Ventricular Rate:  115 PR Interval:  114 QRS Duration:  88 QT Interval:  358 QTC Calculation: 495 R Axis:   7  Text Interpretation: Sinus tachycardia When compared with ECG of 21-Jan-2024 05:19, PREVIOUS ECG IS PRESENT Confirmed by Sheron Hallmark ([id number]) on 02/08/2024 10:35:19 AM   Cath 2021 LM: Normal LAD: Ostial 95% stenosis Ramus: Ostial/prox 30% stenosis LCx: Ostial 75% stenosis RCA: Prox-mid diffuse 40% calcific disease         Distal RCA 70% ISR   LVEDP 17 mmHg  Lexiscan  Tetrofosmin  stress test 01/19/2022: 1 Day Rest/Stress  protocol.  Stress EKG is non-diagnostic as target heart rate not achieved, as this is pharmacological stress test using Lexiscan . Hypertensive response at rest (BP 200/100 mmHG) Normal myocardial perfusion without obvious evidence of reversible myocardial ischemia or prior infarct. Left ventricular wall thickness is preserved with mild septal hypokinesis likely from mechanical effects of  post-operative sternum.  Calculated LVEF 54%. Prior study 01/20/2021: Per report normal myocardial perfusion, stress LVEF 49%, visually appears normal. Low risk study.  ECHO 08/2023 IMPRESSIONS   1. Left ventricular ejection fraction, by estimation, is 55 to 60%. The left ventricle has normal function. The left ventricle has no regional wall motion abnormalities. There is moderate left ventricular hypertrophy.  Left ventricular diastolic parameters were normal.   2. Right ventricular systolic function is normal. The right ventricular size is normal.   3. The mitral valve is normal in structure. No evidence of mitral valve  regurgitation. No evidence of mitral stenosis.   4. The aortic valve is tricuspid. Aortic valve regurgitation is not  visualized. Aortic valve sclerosis/calcification is present, without any  evidence of aortic stenosis.   5. The inferior vena cava is normal in size with greater than 50%  respiratory variability, suggesting right atrial pressure of 3 mmHg.   Risk Assessment/Calculations:    CHA2DS2-VASc Score = 8   This indicates a 10.8% annual risk of stroke. The patient's score is based upon: CHF History: 1 HTN History: 1 Diabetes History: 1 Stroke History: 2 Vascular Disease History: 1 Age Score: 2 Gender Score: 0   Physical Exam:   VS:  BP (!) 76/48 (BP Location: Right Arm, Cuff Size: Normal)   Pulse (!) 115   Ht 5' 9 (1.753 m)   Wt 114 lb (51.7 kg)   SpO2 98%   BMI 16.83 kg/m    Wt Readings from Last 3 Encounters:  02/08/24 114 lb (51.7 kg)  02/03/24 137 lb (62.1 kg)  01/21/24 137 lb 12.6 oz (62.5 kg)    GEN: Well nourished, well developed in no acute distress while sitting in chair.  NECK: No JVD; No carotid bruits CARDIAC: RRR, no murmurs, rubs, gallops RESPIRATORY:  Diminished breath sounds in lung bases. ABDOMEN: Soft, non-tender, non-distended EXTREMITIES:  No edema; No deformity   ASSESSMENT AND PLAN: .   Hypotension Primary  hypertension Sinus Tachycardia Does not check BP at home  BP this OV hypotensive: 75/42 then repeat BP 76/48.  EKG today shows sinus tachycardia with HR 115.  Reports being extremely tired and wanting to lay down. Denies any dizziness, presyncope or syncope.  Continue on Midodrine  20 mg TID Discussed referring to hospital for hypotension/tachycardia and IV fluids. However, patient and wife decline and are not interested.  Per Dr. Ladona, patient has a known history of hypotension. Order Florinef 0.1 mg daily, discontinue Lopressor  and Isordil . Suspect patient is tachycardiac due to compensation from hypotension. Continue to monitor HR and BP.  Encourage to continue PT and heart healthy low sodium diet. Discussed limiting sodium intake to < 2 grams daily.     CAD  Atypical chest pain HLD, LDL < 55 2015 LHC: PCI/DES to PDA.   Lexiscan  Myoview  in 2017.   NSTEMI in 2021 LHC that showed a 5% ostial LAD 40% calcific proximal RCA and 70% in-stent restenosis of the distal RCA.  He underwent a CABG x 4 with (LIMA-LAD, RIMA to PDA, Lt radial artery graft to 1st OM and distal OM branches of LCx).   CABG complications included delayed closure due  to edema, blood anemia requiring 2 units of PRBC, atrial flutter treated w/ IV amio leading to hypotension requiring DCCV, cardiac arrest, repoening for carotid massage and clot removal, AKI, cardiogenic shock, acute CVA, CRRT with subsequent sternal washout with placement of wound VAC.  Lexiscan  in 01/19/2022 that showed no reversible ischemia.  Recent hospitalization x2 in 12/2023 as above for noncardiac chest pain w/o any further ischemic evaluation.  Today reports unchanged chronic atypical chest pain. Denies any active chest pain.  Discussed if recurrent or worsening chest pain to contact office. Would consider repeat Lexiscan .  Continue on Lipitor 20 mg daily, Plavix  75 mg Per Dr. Ladona, d/c isordil  and lopressor  as above.   Heart failure with improved  ejection fraction (HFimpEF)  EF down to 30-35% in 07/2019 in setting of CAD with most recent 55-60% in 08/2023 ECHO 08/2023: moderate LVH, normal L/R function, AV sclerosis/calcification w/o AS Denies SOB, edema, orthopnea, or PND. Appears Euvolemic on exam.  Per Dr. Ladona, d/c isordil  and lopressor  as above.  Diuretic managed by nephrology.  Encouraged low sodium diet, fluid restriction <2L, and daily weights.  Educated to contact our office for weight gain of 2 lbs overnight or 5 lbs in one week. ED precautions discussed.    Paroxysmal atrial flutter (HCC) EKG today shows sinus tachycardia with no recurrence of afib.  On exam noted tachycardic and regular  Denies palpitations or active bleeding.  Per Dr. Ladona, d/c Lopressor  as above.  Not on AC due to hx of GI bleeding.   ESRD on dialysis [facility]) Compliant with dialysis on MWF  Diuretic per nephrology    Dispo:  Follow up with Dr. Ladona in 6-8 weeks.   Signed, Lorette CINDERELLA Kapur, PA-C

## 2024-02-07 NOTE — Telephone Encounter (Unsigned)
 Copied from CRM [id number]. Topic: Clinical - Medical Advice >> Feb 07, 2024  2:26 PM Ashley R wrote: Reason for CRM: calling back in regards to Missed call re: flu shot and RN was unable to leave a VM - please call back on [phone] to discuss

## 2024-02-07 NOTE — Telephone Encounter (Signed)
LM for pt or wife to return my call

## 2024-02-07 NOTE — Telephone Encounter (Signed)
 Please see other encounter.

## 2024-02-07 NOTE — Telephone Encounter (Signed)
 Called and spoke w pt wife who states pt has been out of the facility for a while now. She states pt is in dialysis MWF and was able to schedule Tuesday 10/28 next avail appt.   LM for Jim at [facility] ok for verbal orders.  Pt wife wanted to know is it ok for pt to get a flu shot? She states dialysis wanted her to check w pcp before they were able to administer it.

## 2024-02-07 NOTE — Telephone Encounter (Signed)
 It looks like he was discharged to a facility from hospital. Is he still at a facility? Typically we do not give verbals at a facility. If he is discharged from facility please schedule follow up with me and ok for verbals.

## 2024-02-08 ENCOUNTER — Encounter: Payer: Self-pay | Admitting: Nurse Practitioner

## 2024-02-08 ENCOUNTER — Other Ambulatory Visit (HOSPITAL_COMMUNITY): Payer: Self-pay

## 2024-02-08 ENCOUNTER — Ambulatory Visit: Attending: Nurse Practitioner | Admitting: Physician Assistant

## 2024-02-08 VITALS — BP 76/48 | HR 115 | Ht 69.0 in | Wt 114.0 lb

## 2024-02-08 DIAGNOSIS — N186 End stage renal disease: Secondary | ICD-10-CM

## 2024-02-08 DIAGNOSIS — I25118 Atherosclerotic heart disease of native coronary artery with other forms of angina pectoris: Secondary | ICD-10-CM | POA: Diagnosis not present

## 2024-02-08 DIAGNOSIS — E785 Hyperlipidemia, unspecified: Secondary | ICD-10-CM | POA: Diagnosis not present

## 2024-02-08 DIAGNOSIS — I10 Essential (primary) hypertension: Secondary | ICD-10-CM | POA: Diagnosis not present

## 2024-02-08 DIAGNOSIS — I4892 Unspecified atrial flutter: Secondary | ICD-10-CM | POA: Diagnosis not present

## 2024-02-08 DIAGNOSIS — I959 Hypotension, unspecified: Secondary | ICD-10-CM | POA: Diagnosis not present

## 2024-02-08 DIAGNOSIS — R0789 Other chest pain: Secondary | ICD-10-CM | POA: Diagnosis not present

## 2024-02-08 DIAGNOSIS — I5020 Unspecified systolic (congestive) heart failure: Secondary | ICD-10-CM

## 2024-02-08 DIAGNOSIS — R000 Tachycardia, unspecified: Secondary | ICD-10-CM | POA: Diagnosis not present

## 2024-02-08 DIAGNOSIS — Z992 Dependence on renal dialysis: Secondary | ICD-10-CM

## 2024-02-08 MED ORDER — FLUDROCORTISONE ACETATE 0.1 MG PO TABS
0.1000 mg | ORAL_TABLET | Freq: Every day | ORAL | 3 refills | Status: DC
  Filled 2024-02-08: qty 90, 90d supply, fill #0

## 2024-02-08 NOTE — Telephone Encounter (Unsigned)
 Copied from CRM [id number]. Topic: General - Call Back - No Documentation >> Feb 07, 2024  4:53 PM Lauren C wrote: Reason for CRM: Returning call for Christian [name], CMA regarding flu shot. Attempted transfer to [facility] to speak with Chiquita, but held time was over 10 minutes so disconnected. I let pt know that verbal approval was given to [facility] for pt to get flu shot. If anything else is needed, please give pt a call back at [phone]

## 2024-02-08 NOTE — Patient Instructions (Signed)
 Medication Instructions:  Stop Imdur (Isosorbide  Dinitrate)  Stop Metoprolol   Start Florinef 0.1mg  take 1 tablet nightly.  *If you need a refill on your cardiac medications before your next appointment, please call your pharmacy*  Lab Work: NONE ordered at this time of appointment   Testing/Procedures: NONE ordered at this time of appointment   Follow-Up: At [facility], you and your health needs are our priority.  As part of our continuing mission to provide you with exceptional heart care, our providers are all part of one team.  This team includes your primary Cardiologist (physician) and Advanced Practice Providers or APPs (Physician Assistants and Nurse Practitioners) who all work together to provide you with the care you need, when you need it.  Your next appointment:   6-8 week(s)  Provider:   Gordy Bergamo, MD    We recommend signing up for the patient portal called MyChart.  Sign up information is provided on this After Visit Summary.  MyChart is used to connect with patients for Virtual Visits (Telemedicine).  Patients are able to view lab/test results, encounter notes, upcoming appointments, etc.  Non-urgent messages can be sent to your provider as well.   To learn more about what you can do with MyChart, go to [email or web address].

## 2024-02-09 ENCOUNTER — Telehealth: Payer: Self-pay

## 2024-02-09 DIAGNOSIS — E1122 Type 2 diabetes mellitus with diabetic chronic kidney disease: Secondary | ICD-10-CM | POA: Diagnosis not present

## 2024-02-09 DIAGNOSIS — E1142 Type 2 diabetes mellitus with diabetic polyneuropathy: Secondary | ICD-10-CM | POA: Diagnosis not present

## 2024-02-09 DIAGNOSIS — I132 Hypertensive heart and chronic kidney disease with heart failure and with stage 5 chronic kidney disease, or end stage renal disease: Secondary | ICD-10-CM | POA: Diagnosis not present

## 2024-02-09 DIAGNOSIS — I2510 Atherosclerotic heart disease of native coronary artery without angina pectoris: Secondary | ICD-10-CM | POA: Diagnosis not present

## 2024-02-09 DIAGNOSIS — E1169 Type 2 diabetes mellitus with other specified complication: Secondary | ICD-10-CM | POA: Diagnosis not present

## 2024-02-09 DIAGNOSIS — I5030 Unspecified diastolic (congestive) heart failure: Secondary | ICD-10-CM | POA: Diagnosis not present

## 2024-02-09 DIAGNOSIS — I4892 Unspecified atrial flutter: Secondary | ICD-10-CM | POA: Diagnosis not present

## 2024-02-09 DIAGNOSIS — I700 Atherosclerosis of aorta: Secondary | ICD-10-CM | POA: Diagnosis not present

## 2024-02-09 DIAGNOSIS — D631 Anemia in chronic kidney disease: Secondary | ICD-10-CM | POA: Diagnosis not present

## 2024-02-09 DIAGNOSIS — N39498 Other specified urinary incontinence: Secondary | ICD-10-CM | POA: Diagnosis not present

## 2024-02-09 DIAGNOSIS — N186 End stage renal disease: Secondary | ICD-10-CM | POA: Diagnosis not present

## 2024-02-09 DIAGNOSIS — I255 Ischemic cardiomyopathy: Secondary | ICD-10-CM | POA: Diagnosis not present

## 2024-02-09 DIAGNOSIS — F02A11 Dementia in other diseases classified elsewhere, mild, with agitation: Secondary | ICD-10-CM | POA: Diagnosis not present

## 2024-02-09 DIAGNOSIS — I480 Paroxysmal atrial fibrillation: Secondary | ICD-10-CM | POA: Diagnosis not present

## 2024-02-09 DIAGNOSIS — R1310 Dysphagia, unspecified: Secondary | ICD-10-CM | POA: Diagnosis not present

## 2024-02-09 DIAGNOSIS — G35D Multiple sclerosis, unspecified: Secondary | ICD-10-CM | POA: Diagnosis not present

## 2024-02-09 DIAGNOSIS — Z7902 Long term (current) use of antithrombotics/antiplatelets: Secondary | ICD-10-CM | POA: Diagnosis not present

## 2024-02-09 DIAGNOSIS — E7800 Pure hypercholesterolemia, unspecified: Secondary | ICD-10-CM | POA: Diagnosis not present

## 2024-02-09 DIAGNOSIS — N401 Enlarged prostate with lower urinary tract symptoms: Secondary | ICD-10-CM | POA: Diagnosis not present

## 2024-02-09 DIAGNOSIS — I959 Hypotension, unspecified: Secondary | ICD-10-CM | POA: Diagnosis not present

## 2024-02-09 DIAGNOSIS — Z992 Dependence on renal dialysis: Secondary | ICD-10-CM | POA: Diagnosis not present

## 2024-02-09 DIAGNOSIS — I252 Old myocardial infarction: Secondary | ICD-10-CM | POA: Diagnosis not present

## 2024-02-09 DIAGNOSIS — J449 Chronic obstructive pulmonary disease, unspecified: Secondary | ICD-10-CM | POA: Diagnosis not present

## 2024-02-09 DIAGNOSIS — L89322 Pressure ulcer of left buttock, stage 2: Secondary | ICD-10-CM | POA: Diagnosis not present

## 2024-02-09 NOTE — Telephone Encounter (Signed)
 Ok to treat pressure ulcer

## 2024-02-09 NOTE — Telephone Encounter (Signed)
 Copied from CRM [id number]. Topic: Clinical - Medical Advice >> Feb 08, 2024  4:45 PM Sasha M wrote: Reason for CRM: [facility] nurse [name] called in to report that pt has a pressure ulcer stage 2 on bottom. Needs treatment, [name] is awaiting call from pt so she can call with verbal orders and send fax over. Callback number [phone]

## 2024-02-09 NOTE — Telephone Encounter (Signed)
 Called and LVM stating provider gave the ok verbal to treat for patient

## 2024-02-10 ENCOUNTER — Ambulatory Visit (INDEPENDENT_AMBULATORY_CARE_PROVIDER_SITE_OTHER)

## 2024-02-10 ENCOUNTER — Ambulatory Visit: Admitting: Adult Health

## 2024-02-10 ENCOUNTER — Encounter: Payer: Self-pay | Admitting: Adult Health

## 2024-02-10 VITALS — BP 126/68 | HR 121 | Temp 97.1°F | Ht 69.0 in | Wt 115.0 lb

## 2024-02-10 DIAGNOSIS — R5381 Other malaise: Secondary | ICD-10-CM

## 2024-02-10 DIAGNOSIS — I2510 Atherosclerotic heart disease of native coronary artery without angina pectoris: Secondary | ICD-10-CM | POA: Diagnosis not present

## 2024-02-10 DIAGNOSIS — R918 Other nonspecific abnormal finding of lung field: Secondary | ICD-10-CM | POA: Diagnosis not present

## 2024-02-10 DIAGNOSIS — J683 Other acute and subacute respiratory conditions due to chemicals, gases, fumes and vapors: Secondary | ICD-10-CM

## 2024-02-10 DIAGNOSIS — J42 Unspecified chronic bronchitis: Secondary | ICD-10-CM

## 2024-02-10 DIAGNOSIS — J984 Other disorders of lung: Secondary | ICD-10-CM | POA: Diagnosis not present

## 2024-02-10 DIAGNOSIS — R1319 Other dysphagia: Secondary | ICD-10-CM

## 2024-02-10 DIAGNOSIS — J90 Pleural effusion, not elsewhere classified: Secondary | ICD-10-CM | POA: Diagnosis not present

## 2024-02-10 DIAGNOSIS — Z951 Presence of aortocoronary bypass graft: Secondary | ICD-10-CM | POA: Diagnosis not present

## 2024-02-10 DIAGNOSIS — Z992 Dependence on renal dialysis: Secondary | ICD-10-CM | POA: Diagnosis not present

## 2024-02-10 DIAGNOSIS — N186 End stage renal disease: Secondary | ICD-10-CM | POA: Diagnosis not present

## 2024-02-10 DIAGNOSIS — E440 Moderate protein-calorie malnutrition: Secondary | ICD-10-CM

## 2024-02-10 DIAGNOSIS — R0602 Shortness of breath: Secondary | ICD-10-CM | POA: Diagnosis not present

## 2024-02-10 MED ORDER — BUDESONIDE-FORMOTEROL FUMARATE 160-4.5 MCG/ACT IN AERO: 2.0000 | INHALATION_SPRAY | Freq: Two times a day (BID) | RESPIRATORY_TRACT | 11 refills | Status: DC

## 2024-02-10 NOTE — Progress Notes (Signed)
 @Patient  ID: Christian [name], male    DOB: [birth year], 78 y.o.   MRN: [id number]  Chief Complaint  Patient presents with   Follow-up    Referring provider: Rollene Almarie LABOR, *  HPI: 78 year old male former smoker followed for restrictive lung disease, chronic pleural effusions Medical history significant for coronary artery disease Critical illness January 2023 postop complications from CABG, including cardiogenic shock, cardiac arrest, A-fib, pleural effusions, end-stage renal disease on dialysis Monday Wednesday Friday, diabetes, MS, mild dementia Critical illness June 2025 acute perforated appendicitis complicated by postoperative abscess, ileus and advanced acute on chronic kidney disease requiring hemodialysis, new onset A-fib with RVR (not on anticoagulation therapy due to history of GI bleed)  TEST/EVENTS :  Echo August 2022 mildly reduced EF at 45 to 50% Discussed the use of AI scribe software for clinical note transcription with the patient, who gave verbal consent to proceed.  History of Present Illness Christian [name] is a 78 year old male with chronic lung problems with restrictive lung disease, chronic pleural effusions and chronic bronchitis and a former smoker. Presents for a posthospital follow-up.   He is accompanied by his wife, who is his primary caregiver. He has a history of chronic lung problems, including restrictive lung disease and chronic bronchitis. He is a former smoker and has been exposed to asbestos at his workplace. He uses Symbicort  inhaler twice a day or as needed.  He continues to have ongoing difficulties with breathing and experiencing shortness of breath with minimal activities feels like he cannot get enough air at times.  Patient has been hospitalized several times over the last few months.  Hospital records from June and September reviewed.  During June admission patient had a prolonged hospitalization with acute perforated appendicitis with  postoperative complications including postoperative abscess, ileus, worsening chronic kidney disease requiring hemodialysis, new onset A-fib with RVR and hypertension complicated by intermittent hypotension.  During Early September hospitalization for atypical chest pain patient was ruled out for acute coronary syndrome.  Was continued on dialysis.  CT chest on September 11 showed no PE.  Chronic small bilateral pleural effusions, stable rounded atelectasis in the left lower lobe and new rounded atelectasis in the right lower lobe.  Patient was readmitted end of September for atypical chest pain, sinus tachycardia.  CT chest was negative for PE, stable rounded subpleural consolidation left lower lobe, bronchiectasis left lower lobe, stable right lower lobe consolidation, small complex bilateral pleural effusions associated pleural thickening and calcifications.  Seen by cardiology during hospitalization.  And felt chest pain was noncardiac in nature.  Felt symptoms were exacerbated by dehydration and malnutrition. Patient was seen by cardiology with office notes reviewed on February 08, 2024.  At that time was having ongoing hypotension.  He was recommended to continue on midodrine , discontinuation of Lopressor  and Isordil .  And was started on Florinef 0.1 mg daily.  Patient's blood pressure is better.  He continues to have ongoing tachycardia.  Patient says he still continues to feel very weak has a low appetite.  Is receiving physical therapy at home.  He has a history of renal failure and is currently on dialysis three times a week (Monday, Wednesday, and Friday). His kidney issues began after open heart surgery, which led to complications.  Earlier this year during admission for perforated appendicitis patient's renal function worsened and patient was started on dialysis.  Patient had complications from previous open heart surgery and has had a slow progressive decline since then.  Post-surgery, he  experienced weakness and has been mostly chair and bed-bound since a critical illness in 2023. He was able to walk after rehabilitation but has since regressed, especially after an illness this past summer involving his appendix. He is currently receiving physical therapy to regain strength in his legs and core.  He has experienced swallowing difficulties and is advised to eat and drink in an upright position to prevent aspiration. He has been experiencing a decreased appetite, eating very little recently. He has also lost a significant amount of weight.  We discussed aspiration precautions.  He currently has a nonproductive cough.  No fever or discolored mucus.     No Known Allergies  Immunization History  Administered Date(s) Administered   Fluad Quad(high Dose 65+) 01/09/2019, 01/05/2022   Fluad Trivalent(High Dose 65+) 01/18/2023   INFLUENZA, HIGH DOSE SEASONAL PF 02/08/2017, 01/17/2018, 01/30/2020   Influenza,inj,Quad PF,6+ Mos 02/28/2014, 02/11/2015   Influenza-Unspecified 03/11/2017, 12/06/2020   PFIZER Comirnaty(Gray Top)Covid-19 Tri-[place]ucrose Vaccine 12/06/2020   PFIZER(Purple Top)SARS-COV-2 Vaccination 06/01/2019, 06/22/2019, 01/18/2020, 12/06/2020   PPD Test 10/02/2023   Pneumococcal Conjugate-13 08/13/2015, 01/09/2019   Pneumococcal Polysaccharide-23 02/28/2014   Tdap 08/13/2015, 08/06/2022    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   Diabetic polyneuropathy    Elevated liver enzymes 08/09/2019   Hiatal hernia    History of coronary artery stent placement    History of pulmonary embolism 08/09/2019   HTN (hypertension) 11/12/2009   Hyperlipidemia associated with type 2  diabetes mellitus 11/12/2009   Left leg weakness 11/23/2018   Mild dementia, 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 LIMA TO LAD, RIMA TO PDA, RADIAL ARTERY TO CIRC AND SEQUENTIALLY TO OM1.   Sleep difficulties 05/30/2020   Type II diabetes mellitus 10/10/2014    Tobacco History: Social History   Tobacco Use  Smoking Status Former   Current packs/day: 0.00   Average packs/day: 0.5 packs/day for 20.0 years (10.0 ttl pk-yrs)   Types: Cigarettes   Start date: 92   Quit date: 1975   Years since quitting: 50.8  Smokeless Tobacco Never   Counseling given: Not Answered   Outpatient Medications Prior to Visit  Medication Sig Dispense Refill   acetaminophen  (TYLENOL ) 325 MG tablet Take 3 tablets (975 mg total) by mouth every 6 (six) hours as needed for mild pain (pain score 1-3), fever or headache.     albuterol  (VENTOLIN  HFA) 108 (90 Base) MCG/ACT inhaler Inhale 1-2 puffs into the lungs every 4-6 hours as needed for wheezing or shortness of breath. 6.7 g 0   atorvastatin  (LIPITOR) 20 MG tablet Take 1 tablet (20 mg total) by mouth every evening. 90 tablet 2   budesonide -formoterol  (SYMBICORT ) 160-4.5 MCG/ACT inhaler Inhale 2 puffs into the lungs in the morning and at bedtime. INHALE 2 PUFFS INTO THE LUNGS TWICE A DAY. Rinse mouth, brush teeth/tongue after each use. 10.2 g 0   calcitRIOL  (ROCALTROL ) 0.25 MCG capsule Take 0.25 mcg by mouth 3 (three) times a week. Take one capsule at dinner after on Mon-Wed-Fri.  clopidogrel  (PLAVIX ) 75 MG tablet Take 1 tablet (75 mg total) by mouth daily. 90 tablet 2   diphenhydrAMINE  (BENADRYL ) 25 mg capsule Take 1 capsule (25 mg total) by mouth every 6 (six) hours as needed for itching.     docusate sodium  (COLACE) 100 MG capsule Take 1 capsule (100 mg total) by mouth 2 (two) times daily. 60  capsule 2   ferrous sulfate  325 (65 FE) MG tablet Take 325 mg by mouth daily with breakfast.     fludrocortisone (FLORINEF) 0.1 MG tablet Take 1 tablet (0.1 mg total) by mouth daily. 90 tablet 3   fluticasone  (FLONASE ) 50 MCG/ACT nasal spray Place 2 sprays into both nostrils daily. As needed for congestion. 16 g 0   lidocaine  (LIDODERM ) 5 % Place 1 patch onto the skin daily. Remove & Discard patch within 12 hours or as directed by MD 30 patch 0   midodrine  (PROAMATINE ) 10 MG tablet Take 2 tablets (20 mg total) by mouth 3 (three) times daily with meals. 180 tablet 5   multivitamin (RENA-VIT) TABS tablet Take 1 tablet by mouth at bedtime.     senna-docusate (SENOKOT-S) 8.6-50 MG tablet Take 1 tablet by mouth 2 (two) times daily.     No facility-administered medications prior to visit.     Review of Systems:   Constitutional:   No    Fevers, chills,+ fatigue, or  lassitude.  HEENT:   No headaches,  Difficulty swallowing,  Tooth/dental problems, or  Sore throat,                No sneezing, itching, ear ache, nasal congestion, post nasal drip,   CV:  No chest pain,  Orthopnea, PND, swelling in lower extremities, anasarca, dizziness, palpitations, syncope.   GI  No heartburn, indigestion, abdominal pain, nausea, vomiting, diarrhea, change in bowel habits, loss of appetite, bloody stools.   Resp:   No chest wall deformity  Skin: no rash or lesions.  GU: no dysuria, change in color of urine, no urgency or frequency.  No flank pain, no hematuria   MS:  No joint pain or swelling.  No decreased range of motion.  No back pain.    Physical Exam  BP 126/68   Pulse (!) 121   Temp (!) 97.1 F (36.2 C) (Temporal)   Ht 5' 9 (1.753 m)   Wt 115 lb (52.2 kg)   SpO2 99%   BMI 16.98 kg/m   GEN: A/Ox3; pleasant , NAD, chronically ill-appearing, wheelchair, frail   HEENT:  [place]/AT,  NOSE-clear, THROAT-clear, no lesions, no postnasal drip or exudate noted.   NECK:  Supple w/ fair ROM; no JVD;  normal carotid impulses w/o bruits; no thyromegaly or nodules palpated; no lymphadenopathy.    RESP diminished breath sounds in the bases bilaterally  no accessory muscle use, no dullness to percussion  CARD:  RRR, no m/r/g, trace peripheral edema, pulses intact, no cyanosis or clubbing.  GI:   Soft & nt; nml bowel sounds; no organomegaly or masses detected.   Musco: Warm bil, no deformities or joint swelling noted.   Neuro: alert, no focal deficits noted.    Skin: Warm, no lesions or rashes  Labs and IMaging reviewed   Lab Results:  CBC    Component Value Date/Time   WBC 7.9 01/20/2024 1644   RBC 4.75 01/20/2024 1644   HGB 15.3 01/20/2024 1644   HGB 11.9 (L) 05/08/2021 1201   HCT 47.4 01/20/2024 1644   HCT 35.7 (L)  05/08/2021 1201   PLT 275 01/20/2024 1644   PLT 205 05/08/2021 1201   MCV 99.8 01/20/2024 1644   MCV 97 05/08/2021 1201   MCH 32.2 01/20/2024 1644   MCHC 32.3 01/20/2024 1644   RDW 15.7 (H) 01/20/2024 1644   RDW 13.6 05/08/2021 1201   LYMPHSABS 2.0 01/06/2024 1229   MONOABS 1.1 (H) 01/06/2024 1229   EOSABS 0.1 01/06/2024 1229   BASOSABS 0.1 01/06/2024 1229    BMET    Component Value Date/Time   NA 138 01/21/2024 0515   NA 143 04/16/2022 0000   K 3.1 (L) 01/21/2024 0515   CL 96 (L) 01/21/2024 0515   CO2 28 01/21/2024 0515   GLUCOSE 69 (L) 01/21/2024 0515   BUN 9 01/21/2024 0515   BUN 35 (H) 05/22/2021 1551   CREATININE 3.13 (H) 01/21/2024 0515   CREATININE 2.87 (H) 12/20/2019 1312   CALCIUM  8.8 (L) 01/21/2024 0515   GFRNONAA 20 (L) 01/21/2024 0515   GFRAA 19 (L) 10/18/2019 0046    BNP    Component Value Date/Time   BNP 91.5 01/20/2024 2011    ProBNP    Component Value Date/Time   PROBNP 987 (H) 07/03/2021 1208   PROBNP 64.0 05/28/2020 1538    Imaging: VAS US  UPPER EXT VEIN MAPPING (PRE-OP  AVF) Result Date: 02/03/2024 UPPER EXTREMITY VEIN MAPPING Patient Name:  BLAYNE FRANKIE  Date of Exam:   02/03/2024 Medical Rec #: [id number]         Accession #:    [id number] Date of Birth: 1945/05/11       Patient Gender: M Patient Age:   38 years Exam Location:  [address] Procedure:      VAS US  UPPER EXT VEIN MAPPING (PRE-OP  AVF) Referring Phys: JOSHUA ROBINS --------------------------------------------------------------------------------  Indications: ESRD. Performing Technologist: Dena Pane  Examination Guidelines: A complete evaluation includes B-mode imaging, spectral Doppler, color Doppler, and power Doppler as needed of all accessible portions of each vessel. Bilateral testing is considered an integral part of a complete examination. Limited examinations for reoccurring indications may be performed as noted. +-----------------+-------------+----------+--------------+ Right Cephalic   Diameter (cm)Depth (cm)   Findings    +-----------------+-------------+----------+--------------+ Shoulder                                not visualized +-----------------+-------------+----------+--------------+ Prox upper arm                          not visualized +-----------------+-------------+----------+--------------+ Mid upper arm                           not visualized +-----------------+-------------+----------+--------------+ Dist upper arm       0.02                              +-----------------+-------------+----------+--------------+ Antecubital fossa    0.06                              +-----------------+-------------+----------+--------------+ Prox forearm                            not visualized +-----------------+-------------+----------+--------------+ Mid forearm          0.03                              +-----------------+-------------+----------+--------------+  Dist forearm         0.15                              +-----------------+-------------+----------+--------------+ +-----------------+-------------+----------+--------------+ Right Basilic    Diameter (cm)Depth (cm)   Findings     +-----------------+-------------+----------+--------------+ Prox upper arm       0.18                              +-----------------+-------------+----------+--------------+ Mid upper arm        0.23                              +-----------------+-------------+----------+--------------+ Dist upper arm       0.28                              +-----------------+-------------+----------+--------------+ Antecubital fossa    0.12                              +-----------------+-------------+----------+--------------+ Prox forearm         0.03                              +-----------------+-------------+----------+--------------+ Mid forearm                             not visualized +-----------------+-------------+----------+--------------+ Distal forearm                          not visualized +-----------------+-------------+----------+--------------+ +-----------------+-------------+----------+--------------+ Left Cephalic    Diameter (cm)Depth (cm)   Findings    +-----------------+-------------+----------+--------------+ Shoulder                                not visualized +-----------------+-------------+----------+--------------+ Prox upper arm                          not visualized +-----------------+-------------+----------+--------------+ Mid upper arm                           not visualized +-----------------+-------------+----------+--------------+ Dist upper arm       0.08                              +-----------------+-------------+----------+--------------+ Antecubital fossa                       not visualized +-----------------+-------------+----------+--------------+ Prox forearm                            not visualized +-----------------+-------------+----------+--------------+ Mid forearm          0.04                              +-----------------+-------------+----------+--------------+ Dist forearm  not visualized +-----------------+-------------+----------+--------------+ +-----------------+-------------+----------+--------------+ Left Basilic     Diameter (cm)Depth (cm)   Findings    +-----------------+-------------+----------+--------------+ Prox upper arm       0.61                              +-----------------+-------------+----------+--------------+ Mid upper arm        0.17                              +-----------------+-------------+----------+--------------+ Dist upper arm       0.50                              +-----------------+-------------+----------+--------------+ Antecubital fossa                       not visualized +-----------------+-------------+----------+--------------+ Prox forearm                            not visualized +-----------------+-------------+----------+--------------+ Mid forearm                             not visualized +-----------------+-------------+----------+--------------+ Distal forearm                          not visualized +-----------------+-------------+----------+--------------+ History of left basilic vein transposition. Summary: Right: Please see basilic and cephalic diameters above. Left: Please see basilic and cephalic diameters above. History of       left basilic vein transposition. Stenosis and short segment of       thrombus is seen in the mid upper arm AVF. *See table(s) above for measurements and observations.  Diagnosing physician: Fonda Rim Electronically signed by Fonda Rim on 02/03/2024 at 4:24:50 PM.    Final    VAS US  DUPLEX DIALYSIS ACCESS (AVF, AVG) Result Date: 02/03/2024 DIALYSIS ACCESS Patient Name:  KEANON BEVINS  Date of Exam:   02/03/2024 Medical Rec #: [id number]        Accession #:    [id number] Date of Birth: 1945-11-08       Patient Gender: M Patient Age:   62 years Exam Location:  [address] Procedure:      VAS US  DUPLEX DIALYSIS ACCESS (AVF, AVG) Referring  Phys: FONDA ROBINS --------------------------------------------------------------------------------  Reason for Exam: ESRD. Access Site: Left Upper Extremity. Access Type: Basilic vein transposition. Comparison Study: 10/28/23                   Patent left BVT.                   Patent stent with no stenosis.                   Elevated velocity mid upper arm at vein narrowing.                   Flow volume 769 ml/min. Performing Technologist: Dena Pane  Examination Guidelines: A complete evaluation includes B-mode imaging, spectral Doppler, color Doppler, and power Doppler as needed of all accessible portions of each vessel. Unilateral testing is considered an integral part of a complete examination. Limited examinations for reoccurring indications may be performed as noted.  Findings: +--------------------+----------+-----------------+--------+ AVF  PSV (cm/s)Flow Vol (mL/min)Comments +--------------------+----------+-----------------+--------+ Native artery inflow    61           202                +--------------------+----------+-----------------+--------+ AVF Anastomosis         63                              +--------------------+----------+-----------------+--------+  +------------+----------+-------------+----------+--------+ OUTFLOW VEINPSV (cm/s)Diameter (cm)Depth (cm)Describe +------------+----------+-------------+----------+--------+ Prox UA         90        0.30        0.53            +------------+----------+-------------+----------+--------+ Mid UA         437        0.15        0.35            +------------+----------+-------------+----------+--------+ Dist UA         67        0.51        0.97            +------------+----------+-------------+----------+--------+  Summary: Stenosis is seen in the AVF in the mid upper arm. AVF is noncompressible in the mid upper arm. Questioning hematoma seen in the distal upper arm. Patent distal upper arm  AVF stent, there is no evidence of in-stent stenosis.  *See table(s) above for measurements and observations.  Diagnosing physician: Fonda Rim Electronically signed by Fonda Rim on 02/03/2024 at 2:42:40 PM.   --------------------------------------------------------------------------------   Final    CT Angio Chest PE W and/or Wo Contrast Result Date: 01/20/2024 EXAM: CTA CHEST 01/20/2024 10:07:48 PM TECHNIQUE: CTA of the chest was performed after the administration of intravenous contrast. Multiplanar reformatted images are provided for review. MIP images are provided for review. Automated exposure control, iterative reconstruction, and/or weight based adjustment of the mA/kV was utilized to reduce the radiation dose to as low as reasonably achievable. The study was performed with and without IV contrast, utilizing 65mL of iohexol  (OMNIPAQUE ) 350 MG/ML injection. COMPARISON: Comparison is made to studies dated 02/2024 and 12/12/2020. CLINICAL HISTORY: Pulmonary embolism (PE) suspected, low to intermediate prob, positive D-dimer. Patient presented with complaints of shortness of breath (SOB), was tachy and SOB while in office, and advised to come to ED for further evaluation. Patient has had decreased appetite and denies any pain. FINDINGS: PULMONARY ARTERIES: Pulmonary arteries are adequately opacified for evaluation. No acute pulmonary embolus. Central pulmonary arteries are of normal caliber. No intraluminal filling defect identified to suggest acute or chronic pulmonary embolism. MEDIASTINUM: The heart and pericardium demonstrate no acute abnormality. Global cardiac size is within normal limits. No pericardial effusion. Status post coronary artery bypass grafting. Sternal nonunion noted. Right internal jugular hemodialysis catheter tip seen within the right atrium. LYMPH NODES: No mention of mediastinal, hilar, or axillary lymphadenopathy. LUNGS AND PLEURA: The lungs are not entirely without acute  process. Rounded subpleural consolidation within the left lower lobe with associated marked volume loss is seen, in keeping with changes of rounded atelectasis, stable since a remote prior examination. 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 Stable subpleural consolidation and associated volume loss within the posterobasal right lower lobe likely represent an additional focus of developing rounded atelectasis. Complex small bilateral pleural effusions with associated pleural thickening and calcifications are seen, suggesting chronic pleural effusions and a possible history of trauma,  inflammation, or prior asbestos exposure. No pneumothorax. UPPER ABDOMEN: No acute abnormality within the visualized upper abdomen. Thickening of the adrenal glands is identified, in keeping with changes of adrenal hyperplasia. SOFT TISSUES AND BONES: No acute bone abnormality. Superimposed moderate atherosclerotic calcification within the thoracic aorta. 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 Ascending and distal descending thoracic aorta are of normal caliber. IMPRESSION: 1. No evidence of pulmonary embolism. 2. 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 Recommend annual follow with CT or MR arteriography for further surveillance if clinically indicated. 3. Stable rounded atelectasis in the left lower lobe and developing rounded atelectasis in the right lower lobe. 4. Complex small bilateral pleural effusions with associated pleural thickening and calcifications, stable since prior examination. See differential considerations above. Electronically signed by: Dorethia Molt MD 01/20/2024 10:33 PM EDT RP Workstation: [id number]   DG Chest 2 View Result Date: 01/20/2024 CLINICAL DATA:   Shortness of breath EXAM: CHEST - 2 VIEW COMPARISON:  Chest x-ray 01/06/2024.  CT of the chest 01/06/2024. FINDINGS: Right-sided central venous catheter tip ends in the distal SVC. Sternotomy wires and fixation devices are present. The cardiomediastinal silhouette is within normal limits. Small left pleural effusion is seen, possibly partially loculated posteriorly. Right lung appears clear. Calcified pleural plaques are again seen in the upper lungs. No pneumothorax or acute fracture. Degenerative changes affect the shoulders. IMPRESSION: 1. Small left pleural effusion, possibly partially loculated posteriorly. 2. 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 Electronically Signed   By: Greig Pique M.D.   On: 01/20/2024 17:46    Administration History     None          Latest Ref Rng & Units 06/25/2021   10:41 AM 08/11/2019    1:59 PM  PFT Results  FVC-Pre L 1.56  1.83   FVC-Predicted Pre % 43  50   FVC-Post L 1.56    FVC-Predicted Post % 43    Pre FEV1/FVC % % 88  81   Post FEV1/FCV % % 88    FEV1-Pre L 1.37  1.48   FEV1-Predicted Pre % 51  55   FEV1-Post L 1.36    DLCO uncorrected ml/min/mmHg 14.20    DLCO UNC% % 57    DLCO corrected ml/min/mmHg 15.52    DLCO COR %Predicted % 63    DLVA Predicted % 134    TLC L 3.32    TLC % Predicted % 48    RV % Predicted % 65      No results found for: NITRICOXIDE      Assessment & Plan:   Assessment and Plan Assessment & Plan 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  CT imaging in September showed chronic small complex pleural effusions with pleural thickening and rounded atelectasis.  Both CT chest in September were negative for PE.  Patient continues to have ongoing chronic dyspnea intermittent cough.  Have recommended he restart Symbicort  and take on a regular basis 2 puffs twice daily.   Current oxygen levels are adequate on room air.  Aspiration precautions discussed in detail.  Suspect dyspnea is multifactorial with underlying physical deconditioning, multiple comorbidities including chronic kidney disease, restrictive lung disease, sinus tachycardia Order a chest x-ray to evaluate for  pneumonia and assess pleural effusions. Instruct him to use the Symbicort  inhaler twice daily to improve breathing. Monitor for symptoms of pneumonia and initiate antibiotics if confirmed. Follow up in 3-4 weeks with a pulmonary specialist.  Swallowing dysfunction with aspiration risk   He has swallowing dysfunction with a risk of aspiration. Maintain an upright position during eating and drinking to reduce aspiration risk.  Remain upright 2 hours after eating and prior to bedtime.  End stage renal disease on dialysis  -appears to be stable.  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 Not an anticoagulation candidate due to history of GI bleed. Recent medication adjustments include stopping metoprolol  and isosorbide , and starting Florinef.  Blood pressure is improved.  Blood pressure has improved since medication changes. Monitor heart rate and blood pressure closely. Follow up with cardiology as planned and as needed.  Advise him to contact cardiology or visit the ER if heart rate remains elevated or if symptoms worsen.  Generalized weakness and deconditioning  -has had progressive decline. He has generalized weakness and deconditioning, likely exacerbated by recent illness and hospitalization. He is undergoing physical therapy to improve strength and mobility. Continue physical therapy to improve strength and mobility. Encourage rest and recovery, especially on non-dialysis days.   Protein  calorie malnutrition.-With progressive weight loss decreased appetite  patient is encouraged on a high-protein diet.    I spent 43  minutes dedicated to the care of this patient on the date of this encounter to include pre-visit review of records, face-to-face time with the patient discussing conditions above, post visit ordering of testing, clinical documentation with the electronic health record, making appropriate referrals as documented, and communicating necessary findings to members of the patients care team.   Madelin Stank, NP 02/10/2024

## 2024-02-10 NOTE — Patient Instructions (Addendum)
 Restart Symbicort  2 puffs Twice daily, rinse after use.  Albuterol  inhaler As needed   Chest xray today.  Follow up with cardiology as planned.  Follow up with Dr. Kara in 4 weeks and As needed   Please contact office for sooner follow up if symptoms do not improve or worsen or seek emergency care

## 2024-02-11 ENCOUNTER — Telehealth: Payer: Self-pay

## 2024-02-11 NOTE — Telephone Encounter (Signed)
 Copied from CRM #[id number]. Topic: Clinical - Home Health Verbal Orders >> Feb 11, 2024 12:33 PM Revonda D wrote: Caller/Agency: Signe with [facility]  Callback Number: [phone],dzrlmz line  Service Requested: Occupational Therapy Frequency: once a week for 8 weeks  Any new concerns about the patient? No

## 2024-02-14 ENCOUNTER — Ambulatory Visit: Payer: Self-pay | Admitting: Adult Health

## 2024-02-14 NOTE — Telephone Encounter (Signed)
 Ok for [employer]

## 2024-02-15 ENCOUNTER — Ambulatory Visit (INDEPENDENT_AMBULATORY_CARE_PROVIDER_SITE_OTHER): Admitting: Podiatry

## 2024-02-15 ENCOUNTER — Encounter: Payer: Self-pay | Admitting: Podiatry

## 2024-02-15 DIAGNOSIS — Z992 Dependence on renal dialysis: Secondary | ICD-10-CM | POA: Diagnosis not present

## 2024-02-15 DIAGNOSIS — M79675 Pain in left toe(s): Secondary | ICD-10-CM | POA: Diagnosis not present

## 2024-02-15 DIAGNOSIS — D631 Anemia in chronic kidney disease: Secondary | ICD-10-CM | POA: Diagnosis not present

## 2024-02-15 DIAGNOSIS — F02A11 Dementia in other diseases classified elsewhere, mild, with agitation: Secondary | ICD-10-CM | POA: Diagnosis not present

## 2024-02-15 DIAGNOSIS — I480 Paroxysmal atrial fibrillation: Secondary | ICD-10-CM | POA: Diagnosis not present

## 2024-02-15 DIAGNOSIS — J449 Chronic obstructive pulmonary disease, unspecified: Secondary | ICD-10-CM | POA: Diagnosis not present

## 2024-02-15 DIAGNOSIS — I255 Ischemic cardiomyopathy: Secondary | ICD-10-CM | POA: Diagnosis not present

## 2024-02-15 DIAGNOSIS — L89322 Pressure ulcer of left buttock, stage 2: Secondary | ICD-10-CM | POA: Diagnosis not present

## 2024-02-15 DIAGNOSIS — M79674 Pain in right toe(s): Secondary | ICD-10-CM | POA: Diagnosis not present

## 2024-02-15 DIAGNOSIS — I132 Hypertensive heart and chronic kidney disease with heart failure and with stage 5 chronic kidney disease, or end stage renal disease: Secondary | ICD-10-CM | POA: Diagnosis not present

## 2024-02-15 DIAGNOSIS — R1310 Dysphagia, unspecified: Secondary | ICD-10-CM | POA: Diagnosis not present

## 2024-02-15 DIAGNOSIS — I2510 Atherosclerotic heart disease of native coronary artery without angina pectoris: Secondary | ICD-10-CM | POA: Diagnosis not present

## 2024-02-15 DIAGNOSIS — B351 Tinea unguium: Secondary | ICD-10-CM

## 2024-02-15 DIAGNOSIS — E1142 Type 2 diabetes mellitus with diabetic polyneuropathy: Secondary | ICD-10-CM

## 2024-02-15 DIAGNOSIS — I700 Atherosclerosis of aorta: Secondary | ICD-10-CM | POA: Diagnosis not present

## 2024-02-15 DIAGNOSIS — G35D Multiple sclerosis, unspecified: Secondary | ICD-10-CM | POA: Diagnosis not present

## 2024-02-15 DIAGNOSIS — E1122 Type 2 diabetes mellitus with diabetic chronic kidney disease: Secondary | ICD-10-CM | POA: Diagnosis not present

## 2024-02-15 DIAGNOSIS — I5030 Unspecified diastolic (congestive) heart failure: Secondary | ICD-10-CM | POA: Diagnosis not present

## 2024-02-15 DIAGNOSIS — I4892 Unspecified atrial flutter: Secondary | ICD-10-CM | POA: Diagnosis not present

## 2024-02-15 DIAGNOSIS — N39498 Other specified urinary incontinence: Secondary | ICD-10-CM | POA: Diagnosis not present

## 2024-02-15 DIAGNOSIS — Z7902 Long term (current) use of antithrombotics/antiplatelets: Secondary | ICD-10-CM | POA: Diagnosis not present

## 2024-02-15 DIAGNOSIS — E1169 Type 2 diabetes mellitus with other specified complication: Secondary | ICD-10-CM | POA: Diagnosis not present

## 2024-02-15 DIAGNOSIS — I252 Old myocardial infarction: Secondary | ICD-10-CM | POA: Diagnosis not present

## 2024-02-15 DIAGNOSIS — N401 Enlarged prostate with lower urinary tract symptoms: Secondary | ICD-10-CM | POA: Diagnosis not present

## 2024-02-15 DIAGNOSIS — N186 End stage renal disease: Secondary | ICD-10-CM | POA: Diagnosis not present

## 2024-02-15 DIAGNOSIS — E7800 Pure hypercholesterolemia, unspecified: Secondary | ICD-10-CM | POA: Diagnosis not present

## 2024-02-15 DIAGNOSIS — I959 Hypotension, unspecified: Secondary | ICD-10-CM | POA: Diagnosis not present

## 2024-02-16 ENCOUNTER — Telehealth: Payer: Self-pay

## 2024-02-16 NOTE — Telephone Encounter (Signed)
 Called and LVM for secure line

## 2024-02-16 NOTE — Telephone Encounter (Signed)
 Sent clinicals to [employer] [phone], fax [phone], as requested.

## 2024-02-17 ENCOUNTER — Other Ambulatory Visit: Payer: Self-pay

## 2024-02-17 DIAGNOSIS — N39498 Other specified urinary incontinence: Secondary | ICD-10-CM | POA: Diagnosis not present

## 2024-02-17 NOTE — Patient Outreach (Unsigned)
 Complex Care Management   Visit Note  02/17/2024  Name:  Christian [name] MRN: [id number] DOB: [birth year]  Situation: Referral received for Complex Care Management related to post SNF rehab stay, Heart failure and ESRD I obtained verbal consent from Patient.  Visit completed with Patient and Spouse with patient consent  on the phone  Background:   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   Diabetic polyneuropathy    Elevated liver enzymes 08/09/2019   Hiatal hernia    History of coronary artery stent placement    History of pulmonary embolism 08/09/2019   HTN (hypertension) 11/12/2009   Hyperlipidemia associated with type 2 diabetes mellitus 11/12/2009   Left leg weakness 11/23/2018   Mild dementia, 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 LIMA TO LAD, RIMA TO PDA, RADIAL ARTERY TO CIRC AND SEQUENTIALLY TO OM1.   Sleep difficulties 05/30/2020   Type II diabetes mellitus 10/10/2014    Assessment: Patient Reported Symptoms:  Cognitive Cognitive Status: No symptoms reported      Neurological Neurological Review of Symptoms: Weakness    HEENT HEENT Symptoms Reported: No symptoms reported      Cardiovascular Cardiovascular Symptoms Reported: No symptoms reported (cardiology visit completed) Does patient have uncontrolled Hypertension?: No (per review of chart hypotension at office visits noted. last BP 126/68 on 02/10/24. Patient reports takes medications as recommended. Patient states he does not check BP at home, adding, it  is checked on HD days.) Cardiovascular Management Strategies: Medication therapy, Adequate rest, Routine screening Cardiovascular Comment: cardiology visit 02/08/24 and has an upcoming visit scheduled 03/30/24.  Respiratory Respiratory Symptoms Reported: Shortness of breath Other Respiratory Symptoms: reports SOB with activity. Patient reports he is using inhalers and nebulizers as prescribed. Patient expressing feeling tired a lot. He reports he rest on non dialysis days as recommended by pulmonology. Patient reports he continues to work with home health therapy as recommended. Respiratory Management Strategies: Medication therapy, Adequate rest, Routine screening  Endocrine Endocrine Symptoms Reported: No symptoms reported Is patient diabetic?: Yes (diet controlled) Is patient checking blood sugars at home?: No    Gastrointestinal Gastrointestinal Symptoms Reported: Other Other Gastrointestinal Symptoms: per review of chart: swallowing dysfunction: RNCM reiterated recommendations per pullmonology: risk of aspiration. Maintain an upright position during eating and drinking. Remain upright 2 hours after eating and prior to bedtime to reduce risk of aspiration. encouraged small bites and chewing food thoroughly. Gastrointestinal Management Strategies: Diet modification, Adequate rest, Incontinence garment/pad    Genitourinary Genitourinary Symptoms Reported: Other Additional Genitourinary Details: HD Monday, Wednesday and Friday. Patient reports he still makes urine about every other day and states he has notice some stinging over the past 2 1/2 weeks. He is unable to tell color and reports no odor. Patient reports he wears a brief. Spouse states she has not noticed and that patient has not mentioned to her even though she may ask about how he is doing. Mrs. Paolini reports she will discuss with HD center tomorrow when patient goes to dialysis. Genitourinary Management Strategies:  Hemodialysis Hemodialysis  Schedule: MWF Hemodialysis Last Treatment: 02/16/24  Integumentary Integumentary Symptoms Reported: Skin changes Additional Integumentary Details: stage 2 on bottom monitored by visiting nurse. does not lay on it in bed. change postition at least every two hour. Reports monitored by home health nurse. discussed importance of high protein to diet. Skin Management Strategies: Routine screening, Adequate rest, Activity, Medication therapy  Musculoskeletal Musculoskelatal Symptoms Reviewed: Limited mobility Additional Musculoskeletal Details: continues to work with home health therapy- discussed importance of maintaining strength and mobility. reiterated with patient per pulmonology importance of rest and recovery, especially on non-dialysis days. Patient reports he works with therapy as recommended and states when he is in the bed he moves around and changes positions. he states he is still doing things to improve strength/tone even when he is in the bed.        Psychosocial Psychosocial Symptoms Reported: No symptoms reported         There were no vitals filed for this visit.  Medications Reviewed Today     Reviewed by Sarika Baldini M, RN (Registered Nurse) on 02/17/24 at 1229  Med List Status: <None>   Medication Order Taking? Sig Documenting Provider Last Dose Status Informant  acetaminophen  (TYLENOL ) 325 MG tablet [id number] Yes Take 3 tablets (975 mg total) by mouth every 6 (six) hours as needed for mild pain (pain score 1-3), fever or headache. Laurence Locus, DO  Active   albuterol  (VENTOLIN  HFA) 108 [phone] Base) MCG/ACT inhaler [id number] Yes Inhale 1-2 puffs into the lungs every 4-6 hours as needed for wheezing or shortness of breath. Robins, Joshua E, MD  Active   atorvastatin  (LIPITOR) 20 MG tablet [id number] Yes Take 1 tablet (20 mg total) by mouth every evening. Ganji, Jay, MD  Active Spouse/Significant Other, Pharmacy Records  budesonide -formoterol  (SYMBICORT )  160-4.5 MCG/ACT inhaler [id number] Yes Inhale 2 puffs into the lungs in the morning and at bedtime. Parrett, Madelin RAMAN, NP  Active   calcitRIOL  (ROCALTROL ) 0.25 MCG capsule [id number] Yes Take 0.25 mcg by mouth 3 (three) times a week. Take one capsule at dinner after on Mon-Wed-Fri. [provider]  Active Spouse/Significant Other, Pharmacy Records           Med Note (WHITE, TONIA S   Fri Jan 21, 2024  1:05 AM)    clopidogrel  (PLAVIX ) 75 MG tablet [id number] Yes Take 1 tablet (75 mg total) by mouth daily. Ladona Heinz, MD  Active Spouse/Significant Other, Pharmacy Records  diphenhydrAMINE  (BENADRYL ) 25 mg capsule [id number] Yes Take 1 capsule (25 mg total) by mouth every 6 (six) hours as needed for itching. Danton Reyes DASEN, MD  Active Spouse/Significant Other, Pharmacy Records  docusate sodium  (COLACE) 100 MG capsule [id number] Yes Take 1 capsule (100 mg total) by mouth 2 (two) times daily. Paola Dreama SAILOR, MD  Active Spouse/Significant Other, Pharmacy Records  ferrous sulfate  325 [phone] FE) MG tablet [id number] Yes Take 325 mg by mouth daily with breakfast. [provider]  Active Spouse/Significant Other, Pharmacy Records           Med Note ROSABEL, RICARDO K   Wed Apr 24, 2020  2:09 PM)    fludrocortisone (FLORINEF) 0.1 MG tablet [id number] Yes Take 1 tablet (0.1 mg total) by mouth daily. Sheron Lorette GRADE, PA-C  Active   fluticasone  (FLONASE ) 50 MCG/ACT nasal spray [id number] Yes Place 2 sprays into both nostrils daily. As needed for congestion. Kara Dorn NOVAK, MD  Active   lidocaine  (LIDODERM ) 5 % [id number] Yes Place 1 patch onto the  skin daily. Remove & Discard patch within 12 hours or as directed by MD Rollene Almarie LABOR, MD  Active Spouse/Significant Other, Pharmacy Records  midodrine  (PROAMATINE ) 10 MG tablet [id number] Yes Take 2 tablets (20 mg total) by mouth 3 (three) times daily with meals. Rollene Almarie LABOR, MD  Active Spouse/Significant Other, Pharmacy Records   multivitamin (RENA-VIT) TABS tablet [id number] Yes Take 1 tablet by mouth at bedtime. Danton Reyes DASEN, MD  Active Spouse/Significant Other, Pharmacy Records  senna-docusate (SENOKOT-S) 8.6-50 MG tablet [id number] Yes Take 1 tablet by mouth 2 (two) times daily. Danton Reyes DASEN, MD  Active Spouse/Significant Other, Pharmacy Records          Recommendation:   PCP Follow-up Specialty provider follow-up  02/22/24 PCP; 02/24/24 neurology, 03/28/24 Pulmonology; 03/30/24 Cardiology Continue HD sessions as scheduled Patient to continue to work with home health RN/PT/OT as recommended/scheduled Follow Up Plan:   Telephone follow up appointment date/time:  03/07/24 at 11:30 am   Heddy Shutter, RN, MSN, BSN, CCM [place]  [facility] [place], Population Health Case Manager Phone: [phone]

## 2024-02-19 DIAGNOSIS — E1142 Type 2 diabetes mellitus with diabetic polyneuropathy: Secondary | ICD-10-CM | POA: Diagnosis not present

## 2024-02-19 DIAGNOSIS — I509 Heart failure, unspecified: Secondary | ICD-10-CM | POA: Diagnosis not present

## 2024-02-19 DIAGNOSIS — N186 End stage renal disease: Secondary | ICD-10-CM | POA: Diagnosis not present

## 2024-02-19 DIAGNOSIS — E639 Nutritional deficiency, unspecified: Secondary | ICD-10-CM | POA: Diagnosis not present

## 2024-02-20 NOTE — Progress Notes (Signed)
  Subjective:  Patient ID: Christian [name], male    DOB: October 30, 1945,  MRN: [id number]  Christian [name] presents to clinic today for at risk foot care with history of diabetic neuropathy and painful thick toenails that are difficult to trim. Pain interferes with ambulation. Aggravating factors include wearing enclosed shoe gear. Pain is relieved with periodic professional debridement. Patient has been hospitalized several times since last visit and has also started dialysis per wife. Patient relates toe pain b/l 2nd digits and left 3rd digit. Location is dorsal aspect of digits. Chief Complaint  Patient presents with   [facility]    [facility]  Type 2 diabetes. A1c 4.8 PCP, Dr Christian [name], 01/11/24   New problem(s): None.   PCP is Christian [name] LABOR, MD.  No Known Allergies  Review of Systems: Negative except as noted in the HPI.  Objective: No changes noted in today's physical examination. There were no vitals filed for this visit. Christian [name] is a pleasant 78 y.o. male frail, in NAD. AAO x 3.  Vascular Examination: Capillary refill time immediate b/l. Vascular status intact b/l with palpable DP pulses; faintly palpable PT pulses. Pedal hair absent b/l. No edema. No pain with calf compression b/l. Skin temperature gradient WNL b/l. No cyanosis or clubbing noted b/l. No ischemia or gangrene b/l.   Neurological Examination: Protective sensation intact with 10 gram monofilament right lower extremity. Protective sensation diminished with 10 gram monofilament left lower extremity.  Dermatological Examination: Pedal skin with normal turgor, texture and tone b/l.  No open wounds. No interdigital maceration noted. No blistering, no weeping, no open wounds.  Toenails 3-5 b/l, right great and right 2nd digit thick, discolored, elongated with subungual debris and pain on dorsal palpation.   Anonychia noted left great toe and L 2nd toe. Nailbed(s) epithelialized.   No hyperkeratotic nor  porokeratotic lesions present on today's visit.  Musculoskeletal Examination: Muscle strength 5/5 to all lower extremity muscle groups bilaterally. Hammertoe deformity b/l 2-5. Pes planus deformity noted bilateral LE. Utilizes wheelchair for mobility assistance.  Radiographs: None  Assessment/Plan: 1. Pain due to onychomycosis of toenails of both feet   2. Diabetic polyneuropathy associated with type 2 diabetes mellitus (HCC)   Patient was evaluated and treated. All patient's and/or POA's questions/concerns addressed on today's visit. Mycotic toenails 1-5 b/l debrided in length and girth without incident. Applied tube foam to symptomatic digits for cushioning. Patient and wife related understanding. Continue daily foot inspections and monitor blood glucose per PCP/Endocrinologist's recommendations. Continue soft, supportive shoe gear daily. Report any pedal injuries to medical professional. Call office if there are any questions/concerns.  Return in about 3 months (around 05/17/2024).  Christian [name], DPM      [place] LOCATION: [address] [place] [address]                   Office [phone]   [facility] LOCATION: [address] [place], [place] [address] Office [phone]

## 2024-02-22 ENCOUNTER — Ambulatory Visit (INDEPENDENT_AMBULATORY_CARE_PROVIDER_SITE_OTHER): Admitting: Internal Medicine

## 2024-02-22 ENCOUNTER — Encounter: Payer: Self-pay | Admitting: Internal Medicine

## 2024-02-22 VITALS — BP 110/60 | HR 100 | Temp 97.8°F | Ht 69.0 in

## 2024-02-22 DIAGNOSIS — Z23 Encounter for immunization: Secondary | ICD-10-CM | POA: Diagnosis not present

## 2024-02-22 DIAGNOSIS — M7918 Myalgia, other site: Secondary | ICD-10-CM

## 2024-02-22 DIAGNOSIS — I2510 Atherosclerotic heart disease of native coronary artery without angina pectoris: Secondary | ICD-10-CM | POA: Diagnosis not present

## 2024-02-22 DIAGNOSIS — I5030 Unspecified diastolic (congestive) heart failure: Secondary | ICD-10-CM | POA: Diagnosis not present

## 2024-02-22 DIAGNOSIS — E440 Moderate protein-calorie malnutrition: Secondary | ICD-10-CM | POA: Diagnosis not present

## 2024-02-22 DIAGNOSIS — Z993 Dependence on wheelchair: Secondary | ICD-10-CM

## 2024-02-22 DIAGNOSIS — R0789 Other chest pain: Secondary | ICD-10-CM

## 2024-02-22 DIAGNOSIS — I255 Ischemic cardiomyopathy: Secondary | ICD-10-CM | POA: Diagnosis not present

## 2024-02-22 DIAGNOSIS — Z7902 Long term (current) use of antithrombotics/antiplatelets: Secondary | ICD-10-CM | POA: Diagnosis not present

## 2024-02-22 DIAGNOSIS — N186 End stage renal disease: Secondary | ICD-10-CM

## 2024-02-22 DIAGNOSIS — E7800 Pure hypercholesterolemia, unspecified: Secondary | ICD-10-CM | POA: Diagnosis not present

## 2024-02-22 DIAGNOSIS — I700 Atherosclerosis of aorta: Secondary | ICD-10-CM | POA: Diagnosis not present

## 2024-02-22 DIAGNOSIS — I959 Hypotension, unspecified: Secondary | ICD-10-CM | POA: Diagnosis not present

## 2024-02-22 DIAGNOSIS — Z7189 Other specified counseling: Secondary | ICD-10-CM | POA: Diagnosis not present

## 2024-02-22 DIAGNOSIS — R1310 Dysphagia, unspecified: Secondary | ICD-10-CM | POA: Diagnosis not present

## 2024-02-22 DIAGNOSIS — G35D Multiple sclerosis, unspecified: Secondary | ICD-10-CM | POA: Diagnosis not present

## 2024-02-22 DIAGNOSIS — N39498 Other specified urinary incontinence: Secondary | ICD-10-CM | POA: Diagnosis not present

## 2024-02-22 DIAGNOSIS — I480 Paroxysmal atrial fibrillation: Secondary | ICD-10-CM | POA: Diagnosis not present

## 2024-02-22 DIAGNOSIS — F02A11 Dementia in other diseases classified elsewhere, mild, with agitation: Secondary | ICD-10-CM | POA: Diagnosis not present

## 2024-02-22 DIAGNOSIS — Z992 Dependence on renal dialysis: Secondary | ICD-10-CM | POA: Diagnosis not present

## 2024-02-22 DIAGNOSIS — I132 Hypertensive heart and chronic kidney disease with heart failure and with stage 5 chronic kidney disease, or end stage renal disease: Secondary | ICD-10-CM | POA: Diagnosis not present

## 2024-02-22 DIAGNOSIS — E1142 Type 2 diabetes mellitus with diabetic polyneuropathy: Secondary | ICD-10-CM | POA: Diagnosis not present

## 2024-02-22 DIAGNOSIS — I252 Old myocardial infarction: Secondary | ICD-10-CM | POA: Diagnosis not present

## 2024-02-22 DIAGNOSIS — J449 Chronic obstructive pulmonary disease, unspecified: Secondary | ICD-10-CM | POA: Diagnosis not present

## 2024-02-22 DIAGNOSIS — E1169 Type 2 diabetes mellitus with other specified complication: Secondary | ICD-10-CM | POA: Diagnosis not present

## 2024-02-22 DIAGNOSIS — D631 Anemia in chronic kidney disease: Secondary | ICD-10-CM | POA: Diagnosis not present

## 2024-02-22 DIAGNOSIS — N401 Enlarged prostate with lower urinary tract symptoms: Secondary | ICD-10-CM | POA: Diagnosis not present

## 2024-02-22 DIAGNOSIS — I4892 Unspecified atrial flutter: Secondary | ICD-10-CM | POA: Diagnosis not present

## 2024-02-22 DIAGNOSIS — E1122 Type 2 diabetes mellitus with diabetic chronic kidney disease: Secondary | ICD-10-CM | POA: Diagnosis not present

## 2024-02-22 DIAGNOSIS — L89322 Pressure ulcer of left buttock, stage 2: Secondary | ICD-10-CM | POA: Diagnosis not present

## 2024-02-22 MED ORDER — MIRTAZAPINE 7.5 MG PO TABS: 7.5000 mg | ORAL_TABLET | Freq: Every day | ORAL | 3 refills | Status: DC

## 2024-02-22 NOTE — Patient Instructions (Addendum)
 We have sent in remeron  (mirtazapine ) to take 1 pill daily to help with the appetite.   Come back in 3-4 weeks and I encourage you both to spend some time thinking about talking about what your quality of life looks like. That may include dialysis or not and may include different kinds of healthcare or not. We will talk more next time.

## 2024-02-22 NOTE — Progress Notes (Unsigned)
 Subjective:   Patient ID: Christian [name], male    DOB: 03-02-1946, 78 y.o.   MRN: [id number]  Discussed the use of AI scribe software for clinical note transcription with the patient, who gave verbal consent to proceed.  History of Present Illness Christian [name] is a 78 year old male with end-stage renal disease on dialysis who presents with fatigue and chest pain.  He experiences significant fatigue, particularly after dialysis sessions on Mondays, Wednesdays, and Fridays, requiring assistance to get into bed immediately upon returning home.  He has persistent chest pain, which occurs almost daily and is severe at times. Isosorbide  dinitrate was discontinued due to low blood pressure. The chest pain occurs almost daily and is severe at times. He was previously hospitalized for chest pain.  He experiences difficulty breathing, particularly when eating, affecting his ability to consume adequate nutrition. He has a lack of appetite and struggles with eating due to shortness of breath. His weight has been decreasing, and he acknowledges being 'too skinny' with little muscle mass remaining.  His blood pressure has been notably low, with recent readings around 70/40 mmHg, even on non-dialysis days. This has led to adjustments in his medication regimen, including the cessation of certain blood pressure medications.  He is currently using inhalers for breathing difficulties but is uncertain about their effectiveness. He describes neck pain as severe, likening it to 'the devil' being present.  His caregiver reports that he has been eating less, and there is concern about his nutritional status impacting his overall health and recovery. He wants to eat more but finds it challenging due to his symptoms.  Review of Systems  Constitutional:  Positive for activity change, appetite change and fatigue.  HENT: Negative.    Eyes: Negative.   Respiratory:  Positive for chest tightness. Negative for cough  and shortness of breath.   Cardiovascular:  Positive for chest pain. Negative for palpitations and leg swelling.  Gastrointestinal:  Negative for abdominal distention, abdominal pain, constipation, diarrhea, nausea and vomiting.  Musculoskeletal:  Positive for arthralgias, back pain and neck pain.  Skin:  Positive for wound.  Neurological:  Positive for weakness and numbness.  Psychiatric/Behavioral:  Positive for decreased concentration, dysphoric mood and sleep disturbance.     Objective:  Physical Exam Constitutional:      Appearance: He is well-developed. He is ill-appearing.  HENT:     Head: Normocephalic and atraumatic.  Cardiovascular:     Rate and Rhythm: Normal rate and regular rhythm.     Comments: Access right chest wall Pulmonary:     Effort: Pulmonary effort is normal. No respiratory distress.     Breath sounds: Normal breath sounds. No wheezing or rales.  Abdominal:     General: Bowel sounds are normal. There is no distension.     Palpations: Abdomen is soft.     Tenderness: There is no abdominal tenderness.  Musculoskeletal:        General: Tenderness present.     Cervical back: Normal range of motion.  Skin:    General: Skin is warm and dry.  Neurological:     Mental Status: He is alert and oriented to person, place, and time. Mental status is at baseline.     Motor: Weakness present.     Coordination: Coordination abnormal.     Gait: Gait abnormal.     Vitals:   02/22/24 1320  BP: 110/60  Pulse: 100  Temp: 97.8 F (36.6 C)  TempSrc: Temporal  SpO2: 99%  Height: 5' 9 (1.753 m)    Assessment and Plan Assessment & Plan End stage renal disease on hemodialysis   He experiences significant fatigue and weakness post-dialysis with hypotension, leading to dissatisfaction with his quality of life and the burden of dialysis. I actively discussed goals of care with him and wife and ask them to consider hospice care and comfort-focused treatment as an option  since he has not adjusted well to dialysis and is not satisfied with current quality of life.  Continue the current dialysis schedule. Discuss the potential for hospice care with him and his family. Evaluate his preferences for ongoing dialysis and other treatments.  Protein-calorie malnutrition with weight loss   He has significant weight loss and muscle wasting, with a poor appetite and difficulty eating due to fatigue and possible cardiopulmonary limitations. Prescribe an appetite stimulant mirtazapine  to improve nutritional intake. Monitor his weight and nutritional status closely with follow up 3-4 weeks.  Impaired mobility and functional decline   He requires assistance with mobility and has shown limited progress in physical therapy due to weakness and fatigue, though there is slight improvement in transfer assistance. Continue physical therapy to improve mobility.  Chronic pain at dialysis catheter site and chest wall   He experiences intermittent chest pain, possibly exacerbated by recent medication changes, and persistent pain at the dialysis catheter site. The cessation of isosorbide  dinitrate may have increased chest pain.  Hypotension: stopping 2 meds has allowed his BP to rise but he is now having chest pain he was not having on nitrates. We discussed that with multiple organ failure he may have to accept limitations in his quality of life.   Chronic neck pain   He has ongoing neck pain causing significant discomfort.  I personally spent a total of 45 minutes in the care of the patient today including getting/reviewing separately obtained history, performing a medically appropriate exam/evaluation, counseling and educating, referring and communicating with other health care professionals, and documenting clinical information in the EHR.

## 2024-02-23 ENCOUNTER — Telehealth: Payer: Self-pay

## 2024-02-23 NOTE — Telephone Encounter (Signed)
 [facility]  Order Date: 02/01/2024   Order Type: Physicians Orders  Forms received on 02/15/2024 and  Fax back successfully on 10/24

## 2024-02-23 NOTE — Telephone Encounter (Signed)
 Forms from [facility] were received on 02/15/2024  Verbal Date: 02/03/2024  Fax back successfully on 02/18/2024

## 2024-02-24 ENCOUNTER — Ambulatory Visit: Admitting: Physician Assistant

## 2024-02-24 NOTE — Assessment & Plan Note (Signed)
 He experiences significant fatigue and weakness post-dialysis with hypotension, leading to dissatisfaction with his quality of life and the burden of dialysis. I actively discussed goals of care with him and wife and ask them to consider hospice care and comfort-focused treatment as an option since he has not adjusted well to dialysis and is not satisfied with current quality of life.  Continue the current dialysis schedule. Discuss the potential for hospice care with him and his family. Evaluate his preferences for ongoing dialysis and other treatments.

## 2024-02-24 NOTE — Assessment & Plan Note (Signed)
 Wound care with home health currently.

## 2024-02-24 NOTE — Assessment & Plan Note (Signed)
 He experiences intermittent chest pain, possibly exacerbated by recent medication changes, and persistent pain at the dialysis catheter site. The cessation of isosorbide  dinitrate may have increased chest pain.

## 2024-02-24 NOTE — Assessment & Plan Note (Signed)
 He has significant weight loss and muscle wasting, with a poor appetite and difficulty eating due to fatigue and possible cardiopulmonary limitations. Prescribe an appetite stimulant mirtazapine  to improve nutritional intake. Monitor his weight and nutritional status closely with follow up 3-4 weeks.

## 2024-02-24 NOTE — Assessment & Plan Note (Signed)
 He requires assistance with mobility and has shown limited progress in physical therapy due to weakness and fatigue, though there is slight improvement in transfer assistance. Continue physical therapy to improve mobility.

## 2024-02-25 DIAGNOSIS — N186 End stage renal disease: Secondary | ICD-10-CM | POA: Diagnosis not present

## 2024-02-25 DIAGNOSIS — N179 Acute kidney failure, unspecified: Secondary | ICD-10-CM | POA: Diagnosis not present

## 2024-02-25 DIAGNOSIS — Z992 Dependence on renal dialysis: Secondary | ICD-10-CM | POA: Diagnosis not present

## 2024-02-29 DIAGNOSIS — G35D Multiple sclerosis, unspecified: Secondary | ICD-10-CM | POA: Diagnosis not present

## 2024-02-29 DIAGNOSIS — E1169 Type 2 diabetes mellitus with other specified complication: Secondary | ICD-10-CM | POA: Diagnosis not present

## 2024-02-29 DIAGNOSIS — N401 Enlarged prostate with lower urinary tract symptoms: Secondary | ICD-10-CM | POA: Diagnosis not present

## 2024-02-29 DIAGNOSIS — I4892 Unspecified atrial flutter: Secondary | ICD-10-CM | POA: Diagnosis not present

## 2024-02-29 DIAGNOSIS — I252 Old myocardial infarction: Secondary | ICD-10-CM | POA: Diagnosis not present

## 2024-02-29 DIAGNOSIS — I5030 Unspecified diastolic (congestive) heart failure: Secondary | ICD-10-CM | POA: Diagnosis not present

## 2024-02-29 DIAGNOSIS — L89322 Pressure ulcer of left buttock, stage 2: Secondary | ICD-10-CM | POA: Diagnosis not present

## 2024-02-29 DIAGNOSIS — I132 Hypertensive heart and chronic kidney disease with heart failure and with stage 5 chronic kidney disease, or end stage renal disease: Secondary | ICD-10-CM | POA: Diagnosis not present

## 2024-02-29 DIAGNOSIS — I2510 Atherosclerotic heart disease of native coronary artery without angina pectoris: Secondary | ICD-10-CM | POA: Diagnosis not present

## 2024-02-29 DIAGNOSIS — I480 Paroxysmal atrial fibrillation: Secondary | ICD-10-CM | POA: Diagnosis not present

## 2024-02-29 DIAGNOSIS — E7800 Pure hypercholesterolemia, unspecified: Secondary | ICD-10-CM | POA: Diagnosis not present

## 2024-02-29 DIAGNOSIS — J449 Chronic obstructive pulmonary disease, unspecified: Secondary | ICD-10-CM | POA: Diagnosis not present

## 2024-02-29 DIAGNOSIS — Z7902 Long term (current) use of antithrombotics/antiplatelets: Secondary | ICD-10-CM | POA: Diagnosis not present

## 2024-02-29 DIAGNOSIS — I255 Ischemic cardiomyopathy: Secondary | ICD-10-CM | POA: Diagnosis not present

## 2024-02-29 DIAGNOSIS — Z992 Dependence on renal dialysis: Secondary | ICD-10-CM | POA: Diagnosis not present

## 2024-02-29 DIAGNOSIS — R1310 Dysphagia, unspecified: Secondary | ICD-10-CM | POA: Diagnosis not present

## 2024-02-29 DIAGNOSIS — D631 Anemia in chronic kidney disease: Secondary | ICD-10-CM | POA: Diagnosis not present

## 2024-02-29 DIAGNOSIS — F02A11 Dementia in other diseases classified elsewhere, mild, with agitation: Secondary | ICD-10-CM | POA: Diagnosis not present

## 2024-02-29 DIAGNOSIS — N39498 Other specified urinary incontinence: Secondary | ICD-10-CM | POA: Diagnosis not present

## 2024-02-29 DIAGNOSIS — I959 Hypotension, unspecified: Secondary | ICD-10-CM | POA: Diagnosis not present

## 2024-02-29 DIAGNOSIS — E1142 Type 2 diabetes mellitus with diabetic polyneuropathy: Secondary | ICD-10-CM | POA: Diagnosis not present

## 2024-02-29 DIAGNOSIS — E1122 Type 2 diabetes mellitus with diabetic chronic kidney disease: Secondary | ICD-10-CM | POA: Diagnosis not present

## 2024-02-29 DIAGNOSIS — I700 Atherosclerosis of aorta: Secondary | ICD-10-CM | POA: Diagnosis not present

## 2024-02-29 DIAGNOSIS — N186 End stage renal disease: Secondary | ICD-10-CM | POA: Diagnosis not present

## 2024-03-04 ENCOUNTER — Other Ambulatory Visit: Payer: Self-pay | Admitting: Pulmonary Disease

## 2024-03-07 ENCOUNTER — Other Ambulatory Visit: Payer: Self-pay

## 2024-03-07 NOTE — Patient Outreach (Signed)
 Complex Care Management   Visit Note  03/07/2024  Name:  Christian [name] MRN: [id number] DOB: 06-10-1945  Situation: Referral received for Complex Care Management related to Heart Failure and ESRD I obtained verbal consent from Patient.  Visit completed with Patient  on the phone  Unable to complete assessment as Patient had to end call early to take a home visit from insurance representative. He reports discomfort at his bottom from sacral wound. Patient's spouse reports it is looking good and healing and adds patient is changing positions to stay off his bottom. As well as home health nurse comes to assist with management.  Background:   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   Diabetic polyneuropathy    Elevated liver enzymes 08/09/2019   Hiatal hernia    History of coronary artery stent placement    History of pulmonary embolism 08/09/2019   HTN (hypertension) 11/12/2009   Hyperlipidemia associated with type 2 diabetes mellitus 11/12/2009   Left leg weakness 11/23/2018   Mild dementia, 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 LIMA TO LAD, RIMA TO PDA, RADIAL ARTERY TO CIRC AND SEQUENTIALLY TO OM1.   Sleep difficulties 05/30/2020   Type II diabetes mellitus 10/10/2014    Assessment: Patient Reported Symptoms:  Cognitive Cognitive Status: No symptoms reported      Neurological Neurological Review of Symptoms: No symptoms reported    HEENT HEENT Symptoms Reported: No symptoms reported      Cardiovascular  Cardiovascular Symptoms Reported:  (unable to discuss with patient due to patient had to end call early for insurance representative home visit. per review of chart patient seen by PCP on 02/22/24 for follow up of CP and fatigue. He has an follow up appointment with PCP on 03/14/24.)    Respiratory Other Respiratory Symptoms: unable to discuss with patient due to patient had to end call early for insurance representative home visit. per review of chart upcoming appointment with pulmonology on 03/28/24. seen by PCP on 02/22/24 and has an upcoming appointment with PCP on 03/14/24    Endocrine Endocrine Symptoms Reported: No symptoms reported    Gastrointestinal Gastrointestinal Symptoms Reported:  (upcoming appointment with pulmonlogy on 12/)      Genitourinary Genitourinary Symptoms Reported: No symptoms reported Other Genitourinary Symptoms: continues with HD Monday, Wednesday and Friday.    Integumentary Integumentary Symptoms Reported: Wound Additional Integumentary Details: per spouse areabottome is healing. and HHRN continues to see patient. Reiterated change positions at least every 2 hours.    Musculoskeletal Musculoskelatal Symptoms Reviewed: Limited mobility Additional Musculoskeletal Details: Continues to work with [facility] therapist        Psychosocial Psychosocial Symptoms Reported: Not assessed          There were no vitals filed for this visit.    Medications Reviewed Today     Reviewed by Shivaan Tierno M, RN (Registered Nurse) on 03/07/24 at 1146  Med List Status: <None>   Medication Order Taking? Sig Documenting Provider Last Dose Status Informant  acetaminophen  (TYLENOL ) 325 MG tablet [id number] Yes  Take 3 tablets (975 mg total) by mouth every 6 (six) hours as needed for mild pain (pain score 1-3), fever or headache. Laurence Locus, DO  Active   albuterol  (VENTOLIN  HFA) 108 [phone] Base) MCG/ACT inhaler [id number] Yes Inhale 1-2 puffs into the lungs every 4-6 hours as needed for  wheezing or shortness of breath. Robins, Joshua E, MD  Active   atorvastatin  (LIPITOR) 20 MG tablet [id number] Yes Take 1 tablet (20 mg total) by mouth every evening. Ganji, Jay, MD  Active Spouse/Significant Other, Pharmacy Records  budesonide -formoterol  (SYMBICORT ) 160-4.5 MCG/ACT inhaler [id number] Yes Inhale 2 puffs into the lungs in the morning and at bedtime. Parrett, Madelin RAMAN, NP  Active   calcitRIOL  (ROCALTROL ) 0.25 MCG capsule [id number] Yes Take 0.25 mcg by mouth 3 (three) times a week. Take one capsule at dinner after on Mon-Wed-Fri. [provider]  Active Spouse/Significant Other, Pharmacy Records           Med Note (WHITE, TONIA S   Fri Jan 21, 2024  1:05 AM)    clopidogrel  (PLAVIX ) 75 MG tablet [id number] Yes Take 1 tablet (75 mg total) by mouth daily. Ladona Heinz, MD  Active Spouse/Significant Other, Pharmacy Records  diphenhydrAMINE  (BENADRYL ) 25 mg capsule [id number] Yes Take 1 capsule (25 mg total) by mouth every 6 (six) hours as needed for itching. Danton Reyes DASEN, MD  Active Spouse/Significant Other, Pharmacy Records  docusate sodium  (COLACE) 100 MG capsule [id number] Yes Take 1 capsule (100 mg total) by mouth 2 (two) times daily. Paola Dreama SAILOR, MD  Active Spouse/Significant Other, Pharmacy Records  ferrous sulfate  325 [phone] FE) MG tablet [id number] Yes Take 325 mg by mouth daily with breakfast. [provider]  Active Spouse/Significant Other, Pharmacy Records           Med Note ROSABEL, RICARDO K   Wed Apr 24, 2020  2:09 PM)    fludrocortisone (FLORINEF) 0.1 MG tablet [id number] Yes Take 1 tablet (0.1 mg total) by mouth daily. Sheron Lorette GRADE, PA-C  Active   fluticasone  (FLONASE ) 50 MCG/ACT nasal spray [id number] Yes PLACE 2 SPRAYS INTO BOTH NOSTRILS DAILY. AS NEEDED FOR CONGESTION. Kara Dorn NOVAK, MD  Active   lidocaine  (LIDODERM ) 5 % [id number] Yes Place 1 patch onto the skin daily. Remove & Discard patch within 12 hours or as directed by MD Rollene Almarie LABOR, MD  Active Spouse/Significant Other, Pharmacy Records  midodrine  (PROAMATINE ) 10 MG tablet [id number] Yes Take 2 tablets (20 mg total) by mouth 3 (three) times daily with meals. Rollene Almarie LABOR, MD  Active Spouse/Significant Other, Pharmacy Records  mirtazapine  (REMERON ) 7.5 MG tablet [id number] Yes Take 1 tablet (7.5 mg total) by mouth at bedtime. Rollene Almarie LABOR, MD  Active   multivitamin (RENA-VIT) TABS tablet [id number] Yes Take 1 tablet by mouth at bedtime. Danton Reyes DASEN, MD  Active Spouse/Significant Other, Pharmacy Records  senna-docusate (SENOKOT-S) 8.6-50 MG tablet [id number] Yes Take 1 tablet by mouth 2 (two) times daily. Danton Reyes DASEN, MD  Active Spouse/Significant Other, Pharmacy Records          Recommendation:   PCP Follow-up Continue Current Plan of Care  Follow Up Plan:   Telephone follow up appointment date/time:  04/04/24 at 11:30 am  Heddy Shutter, RN, MSN, BSN, CCM   [facility], Population Health Case Manager Phone: [phone]

## 2024-03-07 NOTE — Patient Instructions (Signed)
 Visit Information  Thank you for taking time to visit with me today. Please don't hesitate to contact me if I can be of assistance to you before our next scheduled appointment.  Your next care management appointment is by telephone on 04/04/24 at 11:30 am.  Please call the care guide team at [phone] if you need to cancel, schedule, or reschedule an appointment.   Please call the Suicide and Crisis Lifeline: 988 call the [place]  National Suicide Prevention Lifeline: (959) 052-5050 or TTY: (765)182-2375 TTY (915)831-4966) to talk to a trained counselor if you are experiencing a Mental Health or Behavioral Health Crisis or need someone to talk to.  Heddy Shutter, RN, MSN, BSN, CCM [place]  [facility], Population Health Case Manager Phone: [phone]

## 2024-03-09 DIAGNOSIS — Z7902 Long term (current) use of antithrombotics/antiplatelets: Secondary | ICD-10-CM | POA: Diagnosis not present

## 2024-03-09 DIAGNOSIS — I700 Atherosclerosis of aorta: Secondary | ICD-10-CM | POA: Diagnosis not present

## 2024-03-09 DIAGNOSIS — E1169 Type 2 diabetes mellitus with other specified complication: Secondary | ICD-10-CM | POA: Diagnosis not present

## 2024-03-09 DIAGNOSIS — I252 Old myocardial infarction: Secondary | ICD-10-CM | POA: Diagnosis not present

## 2024-03-09 DIAGNOSIS — I4892 Unspecified atrial flutter: Secondary | ICD-10-CM | POA: Diagnosis not present

## 2024-03-09 DIAGNOSIS — Z992 Dependence on renal dialysis: Secondary | ICD-10-CM | POA: Diagnosis not present

## 2024-03-09 DIAGNOSIS — N39498 Other specified urinary incontinence: Secondary | ICD-10-CM | POA: Diagnosis not present

## 2024-03-09 DIAGNOSIS — I132 Hypertensive heart and chronic kidney disease with heart failure and with stage 5 chronic kidney disease, or end stage renal disease: Secondary | ICD-10-CM | POA: Diagnosis not present

## 2024-03-09 DIAGNOSIS — I255 Ischemic cardiomyopathy: Secondary | ICD-10-CM | POA: Diagnosis not present

## 2024-03-09 DIAGNOSIS — N401 Enlarged prostate with lower urinary tract symptoms: Secondary | ICD-10-CM | POA: Diagnosis not present

## 2024-03-09 DIAGNOSIS — E1122 Type 2 diabetes mellitus with diabetic chronic kidney disease: Secondary | ICD-10-CM | POA: Diagnosis not present

## 2024-03-09 DIAGNOSIS — D631 Anemia in chronic kidney disease: Secondary | ICD-10-CM | POA: Diagnosis not present

## 2024-03-09 DIAGNOSIS — R1310 Dysphagia, unspecified: Secondary | ICD-10-CM | POA: Diagnosis not present

## 2024-03-09 DIAGNOSIS — L89322 Pressure ulcer of left buttock, stage 2: Secondary | ICD-10-CM | POA: Diagnosis not present

## 2024-03-09 DIAGNOSIS — I959 Hypotension, unspecified: Secondary | ICD-10-CM | POA: Diagnosis not present

## 2024-03-09 DIAGNOSIS — I2510 Atherosclerotic heart disease of native coronary artery without angina pectoris: Secondary | ICD-10-CM | POA: Diagnosis not present

## 2024-03-09 DIAGNOSIS — J449 Chronic obstructive pulmonary disease, unspecified: Secondary | ICD-10-CM | POA: Diagnosis not present

## 2024-03-09 DIAGNOSIS — I480 Paroxysmal atrial fibrillation: Secondary | ICD-10-CM | POA: Diagnosis not present

## 2024-03-09 DIAGNOSIS — G35D Multiple sclerosis, unspecified: Secondary | ICD-10-CM | POA: Diagnosis not present

## 2024-03-09 DIAGNOSIS — N186 End stage renal disease: Secondary | ICD-10-CM | POA: Diagnosis not present

## 2024-03-09 DIAGNOSIS — E1142 Type 2 diabetes mellitus with diabetic polyneuropathy: Secondary | ICD-10-CM | POA: Diagnosis not present

## 2024-03-09 DIAGNOSIS — I5030 Unspecified diastolic (congestive) heart failure: Secondary | ICD-10-CM | POA: Diagnosis not present

## 2024-03-09 DIAGNOSIS — E7800 Pure hypercholesterolemia, unspecified: Secondary | ICD-10-CM | POA: Diagnosis not present

## 2024-03-09 DIAGNOSIS — F02A11 Dementia in other diseases classified elsewhere, mild, with agitation: Secondary | ICD-10-CM | POA: Diagnosis not present

## 2024-03-14 ENCOUNTER — Ambulatory Visit (INDEPENDENT_AMBULATORY_CARE_PROVIDER_SITE_OTHER): Admitting: Internal Medicine

## 2024-03-14 ENCOUNTER — Encounter: Payer: Self-pay | Admitting: Internal Medicine

## 2024-03-14 VITALS — BP 100/60 | HR 93 | Temp 97.4°F | Ht 69.0 in

## 2024-03-14 DIAGNOSIS — F03A0 Unspecified dementia, mild, without behavioral disturbance, psychotic disturbance, mood disturbance, and anxiety: Secondary | ICD-10-CM

## 2024-03-14 DIAGNOSIS — E440 Moderate protein-calorie malnutrition: Secondary | ICD-10-CM

## 2024-03-14 DIAGNOSIS — N186 End stage renal disease: Secondary | ICD-10-CM

## 2024-03-14 DIAGNOSIS — Z7189 Other specified counseling: Secondary | ICD-10-CM | POA: Diagnosis not present

## 2024-03-14 DIAGNOSIS — Z992 Dependence on renal dialysis: Secondary | ICD-10-CM | POA: Diagnosis not present

## 2024-03-14 NOTE — Progress Notes (Signed)
 j  Subjective:   Patient ID: Christian [name], male    DOB: [birth year], 78 y.o.   MRN: [id number]  Discussed the use of AI scribe software for clinical note transcription with the patient, who gave verbal consent to proceed.  History of Present Illness Christian [name] is a 78 year old male with chronic kidney disease on dialysis who presents with persistent fatigue and low blood pressure. His wife is his primary caregiver.  He experiences persistent fatigue and low blood pressure, particularly during and after dialysis sessions. His blood pressure often drops significantly during dialysis, and he reports that the staff sometimes have to watch him for a long time afterwards. He feels tired constantly, regardless of dialysis days, and is frustrated by his ongoing fatigue.  His appetite has slightly improved, and he is eating more regular food, though he remains cautious about his diet, occasionally indulging in foods like bacon in moderation. His weight has fluctuated, with a recent increase, but he is concerned about maintaining a healthy weight and muscle mass.  He is on multiple medications, including blood pressure medications, cholesterol medication, breathing inhalers, calcitriol  for kidney function, Plavix  for heart and vascular health, and midodrine  and fludrocortisone  to help maintain his blood pressure. He wishes to reduce his medication burden, questioning the necessity of all prescribed medications.  He has been receiving physical therapy, which he finds tiring, especially on dialysis days. Despite this, he has not noticed significant improvement in strength or energy levels. He remains mostly bedridden, with limited mobility and energy to engage in activities outside of bed.  His wife reports challenges in caregiving, noting his fluctuating preferences and difficulty managing his care needs. She is concerned about his reluctance to adhere to medication schedules and his overall quality of  life.  Review of Systems  Constitutional:  Positive for activity change, appetite change, fatigue and unexpected weight change.  Respiratory:  Negative for shortness of breath.   Cardiovascular:  Positive for chest pain. Negative for palpitations and leg swelling.  Gastrointestinal:  Negative for abdominal pain, constipation and diarrhea.  Musculoskeletal:  Positive for arthralgias, neck pain and neck stiffness.  Skin:  Positive for wound.  Neurological:  Positive for weakness, light-headedness and numbness.  Psychiatric/Behavioral:  Positive for decreased concentration and dysphoric mood.     Objective:  Physical Exam Constitutional:      Comments: Chronically ill appearing and cachetic.   HENT:     Head: Normocephalic.  Cardiovascular:     Rate and Rhythm: Normal rate and regular rhythm.  Pulmonary:     Effort: Pulmonary effort is normal.  Musculoskeletal:        General: Normal range of motion.  Skin:    General: Skin is warm and dry.  Neurological:     General: No focal deficit present.     Mental Status: He is alert and oriented to person, place, and time.     Sensory: Sensory deficit present.     Motor: Weakness present.     Coordination: Coordination abnormal.     Comments: Uses hoyer lift to transfer and wheelchair     Vitals:   03/14/24 1418  BP: 100/60  Pulse: 93  Temp: (!) 97.4 F (36.3 C)  TempSrc: Temporal  SpO2: 99%  Height: 5' 9 (1.753 m)   I personally spent a total of 45 minutes in the care of the patient today including preparing to see the patient, performing a medically appropriate exam/evaluation, and counseling and educating.  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 Blood pressure has improved to 100/60 mmHg but remains low. Fatigue and lack of energy continue, likely related to dialysis and organ  function. Quality of life and the impact of dialysis were discussed, noting the risks of discontinuing dialysis, including death from toxin accumulation. Continue the current dialysis regimen with extended monitoring post-dialysis. Potential discontinuation of dialysis was discussed if quality of life is unsatisfactory.  Protein-calorie malnutrition with weight loss   His weight has decreased from 114 lbs to 107 lbs. Appetite has slightly improved with increased food intake. Consistent nutrition is crucial for weight gain and muscle mass. Encourage consistent nutritional intake to support weight gain and muscle mass.  Impaired mobility and functional decline   Persistent fatigue limits his mobility and function. Physical therapy is ongoing with limited progress due to fatigue. Potential hospice involvement for support was discussed. Continue physical therapy sessions and consider hospice involvement for additional support.  Chronic neck pain   Chronic neck pain persists, contributing to overall discomfort.  Unspecified mild dementia without behavioral disturbance   He has mild dementia with occasional confusion and decision-making difficulty. The impact on daily life and decision-making was discussed.  Goals of care and hospice discussion   Quality of life and treatment impact were discussed. He is ambivalent about continuing dialysis and medications. Hospice benefits for decision-making support were discussed, and he is open to hospice options to clarify his wishes and values. A hospice evaluation was ordered to discuss goals of care and potential hospice support. A conversation with the hospice team was facilitated to explore his values and preferences.

## 2024-03-16 ENCOUNTER — Other Ambulatory Visit: Payer: Self-pay | Admitting: Internal Medicine

## 2024-03-17 NOTE — Assessment & Plan Note (Signed)
 He experiences persistent hypotension during dialysis, necessitating extended monitoring. Blood pressure has improved to 100/60 mmHg but remains low. Fatigue and lack of energy continue, likely related to dialysis and organ function. Quality of life and the impact of dialysis were discussed, noting the risks of discontinuing dialysis, including death from toxin accumulation. Continue the current dialysis regimen with extended monitoring post-dialysis. Potential discontinuation of dialysis was discussed if quality of life is unsatisfactory.

## 2024-03-17 NOTE — Assessment & Plan Note (Signed)
 He has mild dementia with occasional confusion and decision-making difficulty. The impact on daily life and decision-making was discussed.

## 2024-03-17 NOTE — Assessment & Plan Note (Signed)
 Quality of life and treatment impact were discussed. He is ambivalent about continuing dialysis and medications. Hospice benefits for decision-making support were discussed, and he is open to hospice options to clarify his wishes and values. A hospice evaluation was ordered to discuss goals of care and potential hospice support. A conversation with the hospice team was facilitated to explore his values and preferences.

## 2024-03-17 NOTE — Assessment & Plan Note (Signed)
 His weight has decreased from 114 lbs to 107 lbs. Appetite has slightly improved with increased food intake. Consistent nutrition is crucial for weight gain and muscle mass. Encourage consistent nutritional intake to support weight gain and muscle mass.

## 2024-03-21 DIAGNOSIS — E639 Nutritional deficiency, unspecified: Secondary | ICD-10-CM | POA: Diagnosis not present

## 2024-03-21 DIAGNOSIS — E1142 Type 2 diabetes mellitus with diabetic polyneuropathy: Secondary | ICD-10-CM | POA: Diagnosis not present

## 2024-03-21 DIAGNOSIS — I509 Heart failure, unspecified: Secondary | ICD-10-CM | POA: Diagnosis not present

## 2024-03-21 DIAGNOSIS — N186 End stage renal disease: Secondary | ICD-10-CM | POA: Diagnosis not present

## 2024-03-26 DIAGNOSIS — Z992 Dependence on renal dialysis: Secondary | ICD-10-CM | POA: Diagnosis not present

## 2024-03-26 DIAGNOSIS — N186 End stage renal disease: Secondary | ICD-10-CM | POA: Diagnosis not present

## 2024-03-26 DIAGNOSIS — N179 Acute kidney failure, unspecified: Secondary | ICD-10-CM | POA: Diagnosis not present

## 2024-03-27 DEATH — deceased

## 2024-03-28 ENCOUNTER — Telehealth: Payer: Self-pay | Admitting: Internal Medicine

## 2024-03-28 ENCOUNTER — Ambulatory Visit: Admitting: Pulmonary Disease

## 2024-03-28 ENCOUNTER — Telehealth: Payer: Self-pay

## 2024-03-28 NOTE — Patient Outreach (Signed)
 RNCM received message from [facility] assistant, L. Greeson that patient's wife called in this morning to report patient is deceased. Message sent to PCP.

## 2024-03-28 NOTE — Telephone Encounter (Unsigned)
 Copied from CRM #[id number]. Topic: General - Deceased Patient >> 25-Apr-2024  8:53 AM Tiffini S wrote: Patient spouse Keniel Ralston called stating that the patient passed away on 04-21-2024- called to called appointment for 04/04/24- called  [phone] spoke with Katheryn to cancel the appointment  Patient spouse wanted to update pcp that patient is deceased

## 2024-03-30 ENCOUNTER — Ambulatory Visit: Admitting: Cardiology

## 2024-04-04 ENCOUNTER — Telehealth

## 2024-05-30 ENCOUNTER — Ambulatory Visit: Admitting: Podiatry
